# Patient Record
Sex: Female | Born: 1955 | Race: Black or African American | Hispanic: No | Smoking: Never smoker
Health system: Southern US, Community
[De-identification: ages and names within clinical notes are randomized; demographics above are authoritative.]

## PROBLEM LIST (undated history)

## (undated) ENCOUNTER — Emergency Department (HOSPITAL_COMMUNITY): Admission: EM | Payer: Self-pay | Source: Home / Self Care

## (undated) DIAGNOSIS — E118 Type 2 diabetes mellitus with unspecified complications: Secondary | ICD-10-CM

## (undated) DIAGNOSIS — I214 Non-ST elevation (NSTEMI) myocardial infarction: Secondary | ICD-10-CM

## (undated) DIAGNOSIS — I10 Essential (primary) hypertension: Secondary | ICD-10-CM

## (undated) DIAGNOSIS — I4892 Unspecified atrial flutter: Secondary | ICD-10-CM

## (undated) DIAGNOSIS — Z794 Long term (current) use of insulin: Secondary | ICD-10-CM

## (undated) DIAGNOSIS — N289 Disorder of kidney and ureter, unspecified: Secondary | ICD-10-CM

## (undated) DIAGNOSIS — I4891 Unspecified atrial fibrillation: Secondary | ICD-10-CM

## (undated) DIAGNOSIS — I219 Acute myocardial infarction, unspecified: Secondary | ICD-10-CM

## (undated) DIAGNOSIS — I252 Old myocardial infarction: Secondary | ICD-10-CM

## (undated) DIAGNOSIS — E785 Hyperlipidemia, unspecified: Secondary | ICD-10-CM

## (undated) DIAGNOSIS — I2101 ST elevation (STEMI) myocardial infarction involving left main coronary artery: Secondary | ICD-10-CM

## (undated) DIAGNOSIS — N186 End stage renal disease: Secondary | ICD-10-CM

## (undated) HISTORY — PX: OTHER SURGICAL HISTORY: SHX169

## (undated) HISTORY — DX: Hyperlipidemia, unspecified: E78.5

## (undated) HISTORY — DX: Essential (primary) hypertension: I10

---

## 2006-02-04 ENCOUNTER — Emergency Department: Payer: Self-pay | Admitting: Unknown Physician Specialty

## 2006-02-09 ENCOUNTER — Emergency Department: Payer: Self-pay | Admitting: Internal Medicine

## 2010-01-22 ENCOUNTER — Observation Stay (HOSPITAL_COMMUNITY): Admission: EM | Admit: 2010-01-22 | Discharge: 2010-01-26 | Payer: Self-pay | Admitting: Emergency Medicine

## 2010-01-24 ENCOUNTER — Ambulatory Visit: Payer: Self-pay | Admitting: Surgery

## 2010-01-24 ENCOUNTER — Encounter (INDEPENDENT_AMBULATORY_CARE_PROVIDER_SITE_OTHER): Payer: Self-pay | Admitting: Internal Medicine

## 2010-03-26 ENCOUNTER — Emergency Department (HOSPITAL_COMMUNITY): Admission: EM | Admit: 2010-03-26 | Discharge: 2010-03-26 | Payer: Self-pay | Admitting: Emergency Medicine

## 2010-12-10 ENCOUNTER — Emergency Department: Payer: Self-pay | Admitting: Emergency Medicine

## 2011-02-21 LAB — GLUCOSE, CAPILLARY: Glucose-Capillary: 68 mg/dL — ABNORMAL LOW (ref 70–99)

## 2011-02-23 LAB — BASIC METABOLIC PANEL
BUN: 14 mg/dL (ref 6–23)
BUN: 8 mg/dL (ref 6–23)
CO2: 27 mEq/L (ref 19–32)
Chloride: 100 mEq/L (ref 96–112)
Chloride: 101 mEq/L (ref 96–112)
Creatinine, Ser: 0.6 mg/dL (ref 0.4–1.2)
Glucose, Bld: 250 mg/dL — ABNORMAL HIGH (ref 70–99)
Potassium: 3.7 mEq/L (ref 3.5–5.1)

## 2011-02-23 LAB — LIPID PANEL
HDL: 44 mg/dL (ref >39)
LDL Cholesterol: 145 mg/dL — ABNORMAL HIGH (ref 0–99)
Triglycerides: 103 mg/dL (ref <150)

## 2011-02-23 LAB — GLUCOSE, CAPILLARY
Glucose-Capillary: 214 mg/dL — ABNORMAL HIGH (ref 70–99)
Glucose-Capillary: 224 mg/dL — ABNORMAL HIGH (ref 70–99)
Glucose-Capillary: 224 mg/dL — ABNORMAL HIGH (ref 70–99)
Glucose-Capillary: 226 mg/dL — ABNORMAL HIGH (ref 70–99)
Glucose-Capillary: 233 mg/dL — ABNORMAL HIGH (ref 70–99)
Glucose-Capillary: 233 mg/dL — ABNORMAL HIGH (ref 70–99)
Glucose-Capillary: 234 mg/dL — ABNORMAL HIGH (ref 70–99)
Glucose-Capillary: 273 mg/dL — ABNORMAL HIGH (ref 70–99)
Glucose-Capillary: 280 mg/dL — ABNORMAL HIGH (ref 70–99)
Glucose-Capillary: 338 mg/dL — ABNORMAL HIGH (ref 70–99)
Glucose-Capillary: 379 mg/dL — ABNORMAL HIGH (ref 70–99)

## 2011-02-23 LAB — CBC
HCT: 38 % (ref 36.0–46.0)
Hemoglobin: 13.1 g/dL (ref 12.0–15.0)
MCHC: 34.3 g/dL (ref 30.0–36.0)
MCV: 86.8 fL (ref 78.0–100.0)
MCV: 87.2 fL (ref 78.0–100.0)
MCV: 87.2 fL (ref 78.0–100.0)
Platelets: 269 10*3/uL (ref 150–400)
Platelets: 272 10*3/uL (ref 150–400)
Platelets: 319 10*3/uL (ref 150–400)
RBC: 4.4 MIL/uL (ref 3.87–5.11)
RBC: 4.43 MIL/uL (ref 3.87–5.11)
RBC: 4.83 MIL/uL (ref 3.87–5.11)
RDW: 13.2 % (ref 11.5–15.5)
RDW: 13.2 % (ref 11.5–15.5)
WBC: 7.4 10*3/uL (ref 4.0–10.5)

## 2011-02-23 LAB — DIFFERENTIAL
Basophils Relative: 1 % (ref 0–1)
Lymphocytes Relative: 42 % (ref 12–46)
Lymphs Abs: 2.6 10*3/uL (ref 0.7–4.0)
Monocytes Absolute: 0.4 10*3/uL (ref 0.1–1.0)
Monocytes Relative: 6 % (ref 3–12)
Monocytes Relative: 8 % (ref 3–12)
Neutrophils Relative %: 48 % (ref 43–77)
Neutrophils Relative %: 53 % (ref 43–77)

## 2011-02-23 LAB — COMPREHENSIVE METABOLIC PANEL
ALT: 12 U/L (ref 0–35)
Albumin: 2.9 g/dL — ABNORMAL LOW (ref 3.5–5.2)
Alkaline Phosphatase: 70 U/L (ref 39–117)
BUN: 8 mg/dL (ref 6–23)
CO2: 30 mEq/L (ref 19–32)
Chloride: 94 mEq/L — ABNORMAL LOW (ref 96–112)
Chloride: 99 mEq/L (ref 96–112)
Creatinine, Ser: 0.65 mg/dL (ref 0.4–1.2)
GFR calc Af Amer: >60 mL/min (ref >60)
GFR calc non Af Amer: >60 mL/min (ref >60)
GFR calc non Af Amer: >60 mL/min (ref >60)
Total Protein: 6.5 g/dL (ref 6.0–8.3)

## 2011-02-23 LAB — POCT CARDIAC MARKERS
Myoglobin, poc: 382 ng/mL (ref 12–200)
Troponin i, poc: <0.05 ng/mL (ref 0.00–0.09)

## 2011-02-23 LAB — HEMOGLOBIN A1C: Hgb A1c MFr Bld: 14.3 % — ABNORMAL HIGH (ref 4.6–6.1)

## 2011-02-23 LAB — TSH: TSH: 1.485 u[IU]/mL (ref 0.350–4.500)

## 2011-02-23 LAB — PROTIME-INR: Prothrombin Time: 14.2 seconds (ref 11.6–15.2)

## 2011-02-23 LAB — C-REACTIVE PROTEIN: CRP: 0 mg/dL — ABNORMAL LOW (ref <0.6)

## 2011-02-23 LAB — APTT: aPTT: 33 seconds (ref 24–37)

## 2012-03-08 ENCOUNTER — Encounter: Payer: Self-pay | Admitting: Internal Medicine

## 2012-03-08 ENCOUNTER — Ambulatory Visit (INDEPENDENT_AMBULATORY_CARE_PROVIDER_SITE_OTHER): Payer: Self-pay | Admitting: Internal Medicine

## 2012-03-08 VITALS — BP 222/118 | HR 76 | Temp 97.7°F | Ht 61.0 in | Wt 126.0 lb

## 2012-03-08 DIAGNOSIS — I10 Essential (primary) hypertension: Secondary | ICD-10-CM | POA: Insufficient documentation

## 2012-03-08 DIAGNOSIS — E119 Type 2 diabetes mellitus without complications: Secondary | ICD-10-CM

## 2012-03-08 DIAGNOSIS — E785 Hyperlipidemia, unspecified: Secondary | ICD-10-CM | POA: Insufficient documentation

## 2012-03-08 HISTORY — DX: Type 2 diabetes mellitus without complications: E11.9

## 2012-03-08 MED ORDER — VALSARTAN-HYDROCHLOROTHIAZIDE 160-12.5 MG PO TABS: 1.0000 | ORAL_TABLET | Freq: Every day | ORAL | Status: DC

## 2012-03-08 NOTE — Patient Instructions (Signed)
Start Diovan-HCT today.  Labs next Friday.  Follow up in 2 weeks.  Check BP daily at home and email with results.

## 2012-03-08 NOTE — Assessment & Plan Note (Signed)
Will check lipids and LFTs with labs. Continue simvastatin. Follow up 2 weeks.

## 2012-03-08 NOTE — Assessment & Plan Note (Signed)
Pt has not been checking BG. Will check A1c with labs and request lab reports from previous office. Follow up 2 weeks. Continue Glipizide-metformin.

## 2012-03-08 NOTE — Progress Notes (Signed)
Subjective:    Patient ID: Deborah [name], female    DOB: [birth year], 56 y.o.   MRN: [id number]  HPI 56 year old female with history of hypertension, diabetes presents to establish care. Her daughter reports that she has been concerned because her mother's blood pressure has been running very high, typically two hundred over one 100s. With elevated blood pressure she has had some headache and dizziness. She has been seen by another physician and they were using lisinopril with no improvement. Her daughter reports that in the past she took Diovan hydrochlorothiazide with significant reduction in her blood pressure. Her daughter reports that she has been compliant with her medications however did not take medications this morning. She denies chest pain or shortness of breath.  In regards to her diabetes, she has not been checking her blood sugars. She reports that she ran out of test strips. She does report full compliance with her medications. She is unsure of her last hemoglobin A1c.  Outpatient Encounter Prescriptions as of 03/08/2012  Medication Sig Dispense Refill  . glipiZIDE (GLUCOTROL) 10 MG tablet Take 10 mg by mouth 2 (two) times daily before a meal.      . metFORMIN (GLUCOPHAGE) 500 MG tablet Take 500 mg by mouth 2 (two) times daily.      . metoprolol (LOPRESSOR) 50 MG tablet Take 50 mg by mouth 2 (two) times daily.      . ranitidine (ZANTAC) 150 MG tablet Take 150 mg by mouth 2 (two) times daily.      . simvastatin (ZOCOR) 40 MG tablet Take 40 mg by mouth every evening.      Marland Kitchen DISCONTD: lisinopril (PRINIVIL,ZESTRIL) 20 MG tablet Take 20 mg by mouth daily.      . valsartan-hydrochlorothiazide (DIOVAN HCT) 160-12.5 MG per tablet Take 1 tablet by mouth daily.  30 tablet  3    Review of Systems  Constitutional: Negative for fever, chills, appetite change, fatigue and unexpected weight change.  HENT: Negative for ear pain, congestion, sore throat, trouble swallowing, neck pain, voice change  and sinus pressure.   Eyes: Negative for visual disturbance.  Respiratory: Negative for cough, shortness of breath, wheezing and stridor.   Cardiovascular: Negative for chest pain, palpitations and leg swelling.  Gastrointestinal: Negative for nausea, vomiting, abdominal pain, diarrhea, constipation, blood in stool, abdominal distention and anal bleeding.  Genitourinary: Negative for dysuria and flank pain.  Musculoskeletal: Negative for myalgias, arthralgias and gait problem.  Skin: Negative for color change and rash.  Neurological: Positive for dizziness, light-headedness and headaches.  Hematological: Negative for adenopathy. Does not bruise/bleed easily.  Psychiatric/Behavioral: Negative for suicidal ideas, sleep disturbance and dysphoric mood. The patient is not nervous/anxious.    BP 222/118  Pulse 76  Temp(Src) 97.7 F (36.5 C) (Oral)  Ht 5\' 1"  (1.549 m)  Wt 126 lb (57.153 kg)  BMI 23.81 kg/m2  SpO2 98%     Objective:   Physical Exam  Constitutional: She is oriented to person, place, and time. She appears well-developed and well-nourished. No distress.  HENT:  Head: Normocephalic and atraumatic.  Right Ear: External ear normal.  Left Ear: External ear normal.  Nose: Nose normal.  Mouth/Throat: Oropharynx is clear and moist. No oropharyngeal exudate.  Eyes: Conjunctivae are normal. Pupils are equal, round, and reactive to light. Right eye exhibits no discharge. Left eye exhibits no discharge. No scleral icterus.  Neck: Normal range of motion. Neck supple. No tracheal deviation present. No thyromegaly present.  Cardiovascular: Normal rate, regular  rhythm and intact distal pulses.  Exam reveals no gallop and no friction rub.   Murmur (high pitched systolic) heard. Pulmonary/Chest: Effort normal and breath sounds normal. No respiratory distress. She has no wheezes. She has no rales. She exhibits no tenderness.  Musculoskeletal: Normal range of motion. She exhibits no edema and  no tenderness.  Lymphadenopathy:    She has no cervical adenopathy.  Neurological: She is alert and oriented to person, place, and time. No cranial nerve deficit. She exhibits normal muscle tone. Coordination normal.  Skin: Skin is warm and dry. No rash noted. She is not diaphoretic. No erythema. No pallor.  Psychiatric: She has a normal mood and affect. Her behavior is normal. Judgment and thought content normal.          Assessment & Plan:

## 2012-03-08 NOTE — Assessment & Plan Note (Addendum)
BP markedly elevated today, however this has been chronic. Pt reports compliance with medications, however did not take medications today. Will change lisinopril to Diovan-HCT, as pt reports much better BP control with this in the past.  Will have daughter check pt BP daily and email with readings. Follow up Monday. Will get report on recent labs from 1-2 weeks ago at her PCPs. Check renal function with labs in 1 week. If no improvement in BP by Monday, will add Amlodipine.

## 2012-03-11 ENCOUNTER — Encounter: Payer: Self-pay | Admitting: Internal Medicine

## 2012-03-11 ENCOUNTER — Ambulatory Visit (INDEPENDENT_AMBULATORY_CARE_PROVIDER_SITE_OTHER): Payer: Self-pay | Admitting: Internal Medicine

## 2012-03-11 VITALS — BP 198/106 | Temp 98.7°F | Ht 61.5 in | Wt 123.8 lb

## 2012-03-11 DIAGNOSIS — E785 Hyperlipidemia, unspecified: Secondary | ICD-10-CM

## 2012-03-11 DIAGNOSIS — E119 Type 2 diabetes mellitus without complications: Secondary | ICD-10-CM

## 2012-03-11 DIAGNOSIS — I10 Essential (primary) hypertension: Secondary | ICD-10-CM

## 2012-03-11 LAB — COMPREHENSIVE METABOLIC PANEL
AST: 17 U/L (ref 0–37)
Albumin: 3.9 g/dL (ref 3.5–5.2)
BUN: 16 mg/dL (ref 6–23)
Calcium: 9.3 mg/dL (ref 8.4–10.5)
Chloride: 99 mEq/L (ref 96–112)
Glucose, Bld: 109 mg/dL — ABNORMAL HIGH (ref 70–99)
Potassium: 3.4 mEq/L — ABNORMAL LOW (ref 3.5–5.1)

## 2012-03-11 LAB — MICROALBUMIN / CREATININE URINE RATIO
Creatinine,U: 247.5 mg/dL
Microalb, Ur: 419.1 mg/dL — ABNORMAL HIGH (ref 0.0–1.9)

## 2012-03-11 MED ORDER — AMLODIPINE BESYLATE 5 MG PO TABS: 5.0000 mg | ORAL_TABLET | Freq: Every day | ORAL | Status: DC

## 2012-03-11 MED ORDER — METOPROLOL TARTRATE 100 MG PO TABS: 100.0000 mg | ORAL_TABLET | Freq: Two times a day (BID) | ORAL | Status: DC

## 2012-03-11 NOTE — Patient Instructions (Signed)
Increase Metoprolol to 100mg  twice daily.  Continue to check blood pressure daily.  Follow up 1 week.

## 2012-03-11 NOTE — Assessment & Plan Note (Signed)
Will check A1c with labs today. Will continue glipizide metformin.

## 2012-03-11 NOTE — Assessment & Plan Note (Signed)
Blood pressure continues to be elevated despite adding Diovan hydrochlorothiazide. Will increase metoprolol to 100 mg twice daily. Will add amlodipine 5 mg daily. Patient's daughter will continue to check blood pressure daily and will e-mail or call with results. Patient will followup in one week or sooner as needed. We'll check renal function with labs today.

## 2012-03-11 NOTE — Assessment & Plan Note (Signed)
Will check lipids and LFTs with labs today. Note that we are adding amlodipine 5 mg daily. If we need to increase amlodipine to 10 mg, will need to change from simvastatin to another cholesterol medication.

## 2012-03-11 NOTE — Progress Notes (Signed)
Subjective:    Patient ID: Deborah [name], female    DOB: 07/07/1956, 56 y.o.   MRN: [id number]  HPI 56 year old female with history of hypertension presents for followup. Her daughter reports that she started her on the Diovan hydrochlorothiazide over the weekend. Her blood pressure continues to be elevated near 200/100. Patient denies any headache, chest pain, palpitations. She reports compliance with her medications including the new medication and her metoprolol.   Outpatient Encounter Prescriptions as of 03/11/2012  Medication Sig Dispense Refill  . glipiZIDE (GLUCOTROL) 10 MG tablet Take 10 mg by mouth 2 (two) times daily before a meal.      . metFORMIN (GLUCOPHAGE) 500 MG tablet Take 500 mg by mouth 2 (two) times daily.      . metoprolol (LOPRESSOR) 100 MG tablet Take 1 tablet (100 mg total) by mouth 2 (two) times daily.  60 tablet  3  . ranitidine (ZANTAC) 150 MG tablet Take 150 mg by mouth 2 (two) times daily.      . simvastatin (ZOCOR) 40 MG tablet Take 40 mg by mouth every evening.      . valsartan-hydrochlorothiazide (DIOVAN HCT) 160-12.5 MG per tablet Take 1 tablet by mouth daily.  30 tablet  3  . DISCONTD: metoprolol (LOPRESSOR) 50 MG tablet Take 50 mg by mouth 2 (two) times daily.      Marland Kitchen amLODipine (NORVASC) 5 MG tablet Take 1 tablet (5 mg total) by mouth daily.  30 tablet  11    Review of Systems  Constitutional: Positive for fatigue. Negative for fever, chills, appetite change and unexpected weight change.  HENT: Negative for ear pain, congestion, sore throat, trouble swallowing, neck pain, voice change and sinus pressure.   Eyes: Negative for visual disturbance.  Respiratory: Negative for cough, shortness of breath, wheezing and stridor.   Cardiovascular: Negative for chest pain, palpitations and leg swelling.  Gastrointestinal: Negative for nausea, vomiting, abdominal pain, diarrhea, constipation, blood in stool, abdominal distention and anal bleeding.  Genitourinary:  Negative for dysuria and flank pain.  Musculoskeletal: Negative for myalgias, arthralgias and gait problem.  Skin: Negative for color change and rash.  Neurological: Negative for dizziness and headaches.  Hematological: Negative for adenopathy. Does not bruise/bleed easily.  Psychiatric/Behavioral: Negative for suicidal ideas, sleep disturbance and dysphoric mood. The patient is not nervous/anxious.    BP 198/106  Temp(Src) 98.7 F (37.1 C) (Oral)  Ht 5' 1.5" (1.562 m)  Wt 123 lb 12 oz (56.133 kg)  BMI 23.00 kg/m2     Objective:   Physical Exam  Constitutional: She is oriented to person, place, and time. She appears well-developed and well-nourished. No distress.  HENT:  Head: Normocephalic and atraumatic.  Right Ear: External ear normal.  Left Ear: External ear normal.  Nose: Nose normal.  Mouth/Throat: Oropharynx is clear and moist. No oropharyngeal exudate.  Eyes: Conjunctivae are normal. Pupils are equal, round, and reactive to light. Right eye exhibits no discharge. Left eye exhibits no discharge. No scleral icterus.  Neck: Normal range of motion. Neck supple. No tracheal deviation present. No thyromegaly present.  Cardiovascular: Normal rate, regular rhythm, normal heart sounds and intact distal pulses.  Exam reveals no gallop and no friction rub.   No murmur heard. Pulmonary/Chest: Effort normal and breath sounds normal. No respiratory distress. She has no wheezes. She has no rales. She exhibits no tenderness.  Musculoskeletal: Normal range of motion. She exhibits no edema and no tenderness.  Lymphadenopathy:    She has no cervical  adenopathy.  Neurological: She is alert and oriented to person, place, and time. No cranial nerve deficit. She exhibits normal muscle tone. Coordination normal.  Skin: Skin is warm and dry. No rash noted. She is not diaphoretic. No erythema. No pallor.  Psychiatric: She has a normal mood and affect. Her behavior is normal. Judgment and thought  content normal.          Assessment & Plan:

## 2012-03-15 ENCOUNTER — Other Ambulatory Visit: Payer: Self-pay

## 2012-03-22 ENCOUNTER — Ambulatory Visit (INDEPENDENT_AMBULATORY_CARE_PROVIDER_SITE_OTHER): Payer: Self-pay | Admitting: Internal Medicine

## 2012-03-22 ENCOUNTER — Encounter: Payer: Self-pay | Admitting: Internal Medicine

## 2012-03-22 VITALS — BP 150/110 | HR 72 | Temp 99.2°F | Wt 121.0 lb

## 2012-03-22 DIAGNOSIS — I10 Essential (primary) hypertension: Secondary | ICD-10-CM

## 2012-03-22 MED ORDER — AMLODIPINE BESYLATE 10 MG PO TABS: 10.0000 mg | ORAL_TABLET | Freq: Every day | ORAL | Status: DC

## 2012-03-22 NOTE — Progress Notes (Signed)
Subjective:    Patient ID: Deborah [name], female    DOB: October 23, 1956, 56 y.o.   MRN: [id number]  HPI 56 year old female with history of hypertension and diabetes presents for followup. Her daughter brings record of her blood pressures which are typically between 150 and 160/90-110. This is an improvement from her previous blood pressure. Patient reports improvement in her energy level, and resolution of her headaches. She reports that she has been feeling well. She has been compliant with her medications and denies any noted side effects.  Outpatient Encounter Prescriptions as of 03/22/2012  Medication Sig Dispense Refill  . amLODipine (NORVASC) 10 MG tablet Take 1 tablet (10 mg total) by mouth daily.  30 tablet  11  . glipiZIDE (GLUCOTROL) 10 MG tablet Take 10 mg by mouth 2 (two) times daily before a meal.      . metFORMIN (GLUCOPHAGE) 500 MG tablet Take 500 mg by mouth 2 (two) times daily.      . metoprolol (LOPRESSOR) 100 MG tablet Take 1 tablet (100 mg total) by mouth 2 (two) times daily.  60 tablet  3  . ranitidine (ZANTAC) 150 MG tablet Take 150 mg by mouth 2 (two) times daily.      . simvastatin (ZOCOR) 40 MG tablet Take 40 mg by mouth every evening.      . valsartan-hydrochlorothiazide (DIOVAN HCT) 160-12.5 MG per tablet Take 1 tablet by mouth daily.  30 tablet  3  . DISCONTD: amLODipine (NORVASC) 5 MG tablet Take 1 tablet (5 mg total) by mouth daily.  30 tablet  11    Review of Systems  Constitutional: Negative for fever, chills, appetite change, fatigue and unexpected weight change.  HENT: Negative for ear pain, congestion, sore throat, trouble swallowing, neck pain, voice change and sinus pressure.   Eyes: Negative for visual disturbance.  Respiratory: Negative for cough, shortness of breath, wheezing and stridor.   Cardiovascular: Negative for chest pain, palpitations and leg swelling.  Gastrointestinal: Negative for nausea, vomiting, abdominal pain, diarrhea, constipation, blood  in stool, abdominal distention and anal bleeding.  Genitourinary: Negative for dysuria and flank pain.  Musculoskeletal: Negative for myalgias, arthralgias and gait problem.  Skin: Negative for color change and rash.  Neurological: Negative for dizziness and headaches.  Hematological: Negative for adenopathy. Does not bruise/bleed easily.  Psychiatric/Behavioral: Negative for suicidal ideas, sleep disturbance and dysphoric mood. The patient is not nervous/anxious.    BP 150/110  Pulse 72  Temp(Src) 99.2 F (37.3 C) (Oral)  Wt 121 lb (54.885 kg)  SpO2 99%     Objective:   Physical Exam  Constitutional: She is oriented to person, place, and time. She appears well-developed and well-nourished. No distress.  HENT:  Head: Normocephalic and atraumatic.  Right Ear: External ear normal.  Left Ear: External ear normal.  Nose: Nose normal.  Mouth/Throat: Oropharynx is clear and moist. No oropharyngeal exudate.  Eyes: Conjunctivae are normal. Pupils are equal, round, and reactive to light. Right eye exhibits no discharge. Left eye exhibits no discharge. No scleral icterus.  Neck: Normal range of motion. Neck supple. No tracheal deviation present. No thyromegaly present.  Cardiovascular: Normal rate, regular rhythm, normal heart sounds and intact distal pulses.  Exam reveals no gallop and no friction rub.   No murmur heard. Pulmonary/Chest: Effort normal and breath sounds normal. No respiratory distress. She has no wheezes. She has no rales. She exhibits no tenderness.  Musculoskeletal: Normal range of motion. She exhibits no edema and no tenderness.  Lymphadenopathy:    She has no cervical adenopathy.  Neurological: She is alert and oriented to person, place, and time. No cranial nerve deficit. She exhibits normal muscle tone. Coordination normal.  Skin: Skin is warm and dry. No rash noted. She is not diaphoretic. No erythema. No pallor.  Psychiatric: She has a normal mood and affect. Her  behavior is normal. Judgment and thought content normal.          Assessment & Plan:

## 2012-03-22 NOTE — Assessment & Plan Note (Signed)
Blood pressure better controlled on current medications. Will increase amlodipine to 10 mg daily with goal of getting blood pressure to 140/90. Patient will followup in one month. Her daughter will continue to record and will e-mail or call with blood pressures in the interim prior to her next appointment.

## 2012-04-19 ENCOUNTER — Encounter: Payer: Self-pay | Admitting: Internal Medicine

## 2012-04-19 ENCOUNTER — Ambulatory Visit (INDEPENDENT_AMBULATORY_CARE_PROVIDER_SITE_OTHER): Payer: Self-pay | Admitting: Internal Medicine

## 2012-04-19 VITALS — BP 152/94 | HR 63 | Temp 97.9°F | Resp 14 | Wt 119.8 lb

## 2012-04-19 DIAGNOSIS — I10 Essential (primary) hypertension: Secondary | ICD-10-CM

## 2012-04-19 DIAGNOSIS — K219 Gastro-esophageal reflux disease without esophagitis: Secondary | ICD-10-CM

## 2012-04-19 DIAGNOSIS — E119 Type 2 diabetes mellitus without complications: Secondary | ICD-10-CM

## 2012-04-19 DIAGNOSIS — E785 Hyperlipidemia, unspecified: Secondary | ICD-10-CM

## 2012-04-19 MED ORDER — PRAVASTATIN SODIUM 20 MG PO TABS: 20.0000 mg | ORAL_TABLET | Freq: Every day | ORAL | Status: DC

## 2012-04-19 MED ORDER — VALSARTAN-HYDROCHLOROTHIAZIDE 320-25 MG PO TABS: 1.0000 | ORAL_TABLET | Freq: Every day | ORAL | Status: DC

## 2012-04-19 MED ORDER — METFORMIN HCL 500 MG PO TABS: 500.0000 mg | ORAL_TABLET | Freq: Two times a day (BID) | ORAL | Status: DC

## 2012-04-19 MED ORDER — RANITIDINE HCL 150 MG PO TABS: 150.0000 mg | ORAL_TABLET | Freq: Two times a day (BID) | ORAL | Status: DC

## 2012-04-19 MED ORDER — SIMVASTATIN 40 MG PO TABS: 40.0000 mg | ORAL_TABLET | Freq: Every evening | ORAL | Status: DC

## 2012-04-19 MED ORDER — GLIPIZIDE 10 MG PO TABS: 10.0000 mg | ORAL_TABLET | Freq: Two times a day (BID) | ORAL | Status: DC

## 2012-04-19 NOTE — Assessment & Plan Note (Signed)
Blood pressure still above goal today. Will increase valsartan to 320 mg and hydrochlorothiazide 25 mg daily. Will repeat renal function and electrolytes in one week. Patient will followup in one month.

## 2012-04-19 NOTE — Progress Notes (Signed)
Subjective:    Patient ID: Deborah [name], female    DOB: 08/29/1956, 56 y.o.   MRN: [id number]  HPI 56 year old female with history of hypertension and diabetes presents for followup. Her daughter reports that she is doing well. Her blood pressure has been better controlled, typically 130-150s/90s. She has not had headache, chest pain, or palpitations. She reports full compliance with her medications.  In regards to her diabetes, she did not bring a record of blood sugars today, but reports good control of her sugars. She also reports full compliance with her diabetes medications.  Outpatient Encounter Prescriptions as of 04/19/2012  Medication Sig Dispense Refill  . amLODipine (NORVASC) 10 MG tablet Take 1 tablet (10 mg total) by mouth daily.  30 tablet  11  . glipiZIDE (GLUCOTROL) 10 MG tablet Take 1 tablet (10 mg total) by mouth 2 (two) times daily before a meal.  60 tablet  6  . metFORMIN (GLUCOPHAGE) 500 MG tablet Take 1 tablet (500 mg total) by mouth 2 (two) times daily.  60 tablet  6  . metoprolol (LOPRESSOR) 100 MG tablet Take 1 tablet (100 mg total) by mouth 2 (two) times daily.  60 tablet  3  . ranitidine (ZANTAC) 150 MG tablet Take 1 tablet (150 mg total) by mouth 2 (two) times daily.  60 tablet  6  . pravastatin (PRAVACHOL) 20 MG tablet Take 1 tablet (20 mg total) by mouth daily.  30 tablet  6  . valsartan-hydrochlorothiazide (DIOVAN-HCT) 320-25 MG per tablet Take 1 tablet by mouth daily.  30 tablet  3   BP 152/94  Pulse 63  Temp(Src) 97.9 F (36.6 C) (Oral)  Resp 14  Wt 119 lb 12 oz (54.318 kg)  SpO2 100%  Review of Systems  Constitutional: Negative for fever, chills, appetite change, fatigue and unexpected weight change.  HENT: Negative for ear pain, congestion, sore throat, trouble swallowing, neck pain, voice change and sinus pressure.   Eyes: Negative for visual disturbance.  Respiratory: Negative for cough, shortness of breath, wheezing and stridor.   Cardiovascular:  Negative for chest pain, palpitations and leg swelling.  Gastrointestinal: Negative for nausea, vomiting, abdominal pain, diarrhea, constipation, blood in stool, abdominal distention and anal bleeding.  Genitourinary: Negative for dysuria and flank pain.  Musculoskeletal: Negative for myalgias, arthralgias and gait problem.  Skin: Negative for color change and rash.  Neurological: Negative for dizziness and headaches.  Hematological: Negative for adenopathy. Does not bruise/bleed easily.  Psychiatric/Behavioral: Negative for suicidal ideas, sleep disturbance and dysphoric mood. The patient is not nervous/anxious.        Objective:   Physical Exam  Constitutional: She is oriented to person, place, and time. She appears well-developed and well-nourished. No distress.  HENT:  Head: Normocephalic and atraumatic.  Right Ear: External ear normal.  Left Ear: External ear normal.  Nose: Nose normal.  Mouth/Throat: Oropharynx is clear and moist. No oropharyngeal exudate.  Eyes: Conjunctivae are normal. Pupils are equal, round, and reactive to light. Right eye exhibits no discharge. Left eye exhibits no discharge. No scleral icterus.  Neck: Normal range of motion. Neck supple. No tracheal deviation present. No thyromegaly present.  Cardiovascular: Normal rate, regular rhythm, normal heart sounds and intact distal pulses.  Exam reveals no gallop and no friction rub.   No murmur heard. Pulmonary/Chest: Effort normal and breath sounds normal. No respiratory distress. She has no wheezes. She has no rales. She exhibits no tenderness.  Musculoskeletal: Normal range of motion. She exhibits no  edema and no tenderness.  Lymphadenopathy:    She has no cervical adenopathy.  Neurological: She is alert and oriented to person, place, and time. No cranial nerve deficit. She exhibits normal muscle tone. Coordination normal.  Skin: Skin is warm and dry. No rash noted. She is not diaphoretic. No erythema. No  pallor.  Psychiatric: She has a normal mood and affect. Her behavior is normal. Judgment and thought content normal.          Assessment & Plan:

## 2012-04-19 NOTE — Assessment & Plan Note (Signed)
Patient has not been taking simvastatin. Will stop simvastatin and start pravastatin given the potential interaction between amlodipine and simvastatin. Goal LDL less than 70. Will repeat lipids in one month.

## 2012-04-19 NOTE — Assessment & Plan Note (Addendum)
Patient reports good control of blood sugars. Will continue current medications. Followup in one month. Will check A1c prior to next visit. Has been holding off on labs until patient establishes [facility] care coverage.

## 2012-04-25 ENCOUNTER — Other Ambulatory Visit (INDEPENDENT_AMBULATORY_CARE_PROVIDER_SITE_OTHER): Payer: Self-pay

## 2012-04-25 DIAGNOSIS — E785 Hyperlipidemia, unspecified: Secondary | ICD-10-CM

## 2012-04-25 DIAGNOSIS — I10 Essential (primary) hypertension: Secondary | ICD-10-CM

## 2012-04-25 LAB — COMPREHENSIVE METABOLIC PANEL
ALT: 9 U/L (ref 0–35)
AST: 14 U/L (ref 0–37)
Albumin: 4 g/dL (ref 3.5–5.2)
BUN: 22 mg/dL (ref 6–23)
CO2: 25 mEq/L (ref 19–32)
Calcium: 9.6 mg/dL (ref 8.4–10.5)
Chloride: 101 mEq/L (ref 96–112)
Creatinine, Ser: 1.4 mg/dL — ABNORMAL HIGH (ref 0.4–1.2)
GFR: 51.94 mL/min — ABNORMAL LOW (ref >60.00)
Potassium: 3.9 mEq/L (ref 3.5–5.1)

## 2012-04-25 LAB — LIPID PANEL
LDL Cholesterol: 111 mg/dL — ABNORMAL HIGH (ref 0–99)
Total CHOL/HDL Ratio: 3
Triglycerides: 68 mg/dL (ref 0.0–149.0)

## 2012-05-24 ENCOUNTER — Ambulatory Visit: Payer: Self-pay | Admitting: Internal Medicine

## 2012-05-24 DIAGNOSIS — Z0289 Encounter for other administrative examinations: Secondary | ICD-10-CM

## 2012-06-14 ENCOUNTER — Ambulatory Visit (INDEPENDENT_AMBULATORY_CARE_PROVIDER_SITE_OTHER): Payer: Self-pay | Admitting: Internal Medicine

## 2012-06-14 ENCOUNTER — Encounter: Payer: Self-pay | Admitting: Internal Medicine

## 2012-06-14 ENCOUNTER — Telehealth: Payer: Self-pay | Admitting: Internal Medicine

## 2012-06-14 VITALS — BP 130/90 | HR 65 | Temp 98.4°F | Ht 61.5 in | Wt 122.0 lb

## 2012-06-14 DIAGNOSIS — E119 Type 2 diabetes mellitus without complications: Secondary | ICD-10-CM

## 2012-06-14 DIAGNOSIS — N183 Chronic kidney disease, stage 3 unspecified: Secondary | ICD-10-CM | POA: Insufficient documentation

## 2012-06-14 DIAGNOSIS — K219 Gastro-esophageal reflux disease without esophagitis: Secondary | ICD-10-CM

## 2012-06-14 DIAGNOSIS — E785 Hyperlipidemia, unspecified: Secondary | ICD-10-CM

## 2012-06-14 DIAGNOSIS — N289 Disorder of kidney and ureter, unspecified: Secondary | ICD-10-CM

## 2012-06-14 DIAGNOSIS — I10 Essential (primary) hypertension: Secondary | ICD-10-CM

## 2012-06-14 MED ORDER — VALSARTAN-HYDROCHLOROTHIAZIDE 320-25 MG PO TABS: 1.0000 | ORAL_TABLET | Freq: Every day | ORAL | Status: DC

## 2012-06-14 MED ORDER — METFORMIN HCL 500 MG PO TABS: 500.0000 mg | ORAL_TABLET | Freq: Two times a day (BID) | ORAL | Status: DC

## 2012-06-14 MED ORDER — PRAVASTATIN SODIUM 20 MG PO TABS: 20.0000 mg | ORAL_TABLET | Freq: Every day | ORAL | Status: DC

## 2012-06-14 MED ORDER — GLIPIZIDE 10 MG PO TABS: 10.0000 mg | ORAL_TABLET | Freq: Two times a day (BID) | ORAL | Status: DC

## 2012-06-14 MED ORDER — RANITIDINE HCL 150 MG PO TABS: 150.0000 mg | ORAL_TABLET | Freq: Two times a day (BID) | ORAL | Status: DC

## 2012-06-14 MED ORDER — METOPROLOL TARTRATE 100 MG PO TABS: 100.0000 mg | ORAL_TABLET | Freq: Two times a day (BID) | ORAL | Status: DC

## 2012-06-14 MED ORDER — AMLODIPINE BESYLATE 10 MG PO TABS: 10.0000 mg | ORAL_TABLET | Freq: Every day | ORAL | Status: DC

## 2012-06-14 NOTE — Assessment & Plan Note (Signed)
Patient noted to have increased creatinine at 1.4 on recent labs. However, microalbuminuria improved with improved control of blood pressure. We'll plan to recheck renal function this month.

## 2012-06-14 NOTE — Progress Notes (Signed)
Subjective:    Patient ID: Deborah [name], female    DOB: [birth year], 56 y.o.   MRN: [id number]  HPI 56 year old female with history of hypertension, diabetes, hyperlipidemia presents for followup. In regards to her hypertension, she reports blood pressure is been well controlled typically 130s over 60s-80s. She does occasionally have an elevated systolic blood pressure in the 160s, but this is typically when she is upset about something. She denies any chest pain, palpitations, fatigue. She reports full compliance with her medications.  In regards to her blood sugars, she reports blood sugars have been well-controlled, and at times even low blood sugars in the 60s. She has tapered back on her glipizide to 5 mg twice daily.  She reports that she is generally feeling well. She has no new concerns today.  Outpatient Encounter Prescriptions as of 06/14/2012  Medication Sig Dispense Refill  . amLODipine (NORVASC) 10 MG tablet Take 1 tablet (10 mg total) by mouth daily.  90 tablet  4  . glipiZIDE (GLUCOTROL) 10 MG tablet Take 1 tablet (10 mg total) by mouth 2 (two) times daily before a meal.  180 tablet  4  . metFORMIN (GLUCOPHAGE) 500 MG tablet Take 1 tablet (500 mg total) by mouth 2 (two) times daily.  180 tablet  4  . metoprolol (LOPRESSOR) 100 MG tablet Take 1 tablet (100 mg total) by mouth 2 (two) times daily.  180 tablet  4  . pravastatin (PRAVACHOL) 20 MG tablet Take 1 tablet (20 mg total) by mouth daily.  90 tablet  4  . ranitidine (ZANTAC) 150 MG tablet Take 1 tablet (150 mg total) by mouth 2 (two) times daily.  180 tablet  4  . valsartan-hydrochlorothiazide (DIOVAN-HCT) 320-25 MG per tablet Take 1 tablet by mouth daily.  90 tablet  4   BP 130/90  Pulse 65  Temp 98.4 F (36.9 C) (Oral)  Ht 5' 1.5" (1.562 m)  Wt 122 lb (55.339 kg)  BMI 22.68 kg/m2  SpO2 98%  Review of Systems  Constitutional: Negative for fever, chills, appetite change, fatigue and unexpected weight change.  HENT:  Negative for ear pain, congestion, sore throat, trouble swallowing, neck pain, voice change and sinus pressure.   Eyes: Negative for visual disturbance.  Respiratory: Negative for cough, shortness of breath, wheezing and stridor.   Cardiovascular: Negative for chest pain, palpitations and leg swelling.  Gastrointestinal: Negative for nausea, vomiting, abdominal pain, diarrhea, constipation, blood in stool, abdominal distention and anal bleeding.  Genitourinary: Negative for dysuria and flank pain.  Musculoskeletal: Negative for myalgias, arthralgias and gait problem.  Skin: Negative for color change and rash.  Neurological: Negative for dizziness and headaches.  Hematological: Negative for adenopathy. Does not bruise/bleed easily.  Psychiatric/Behavioral: Negative for suicidal ideas, disturbed wake/sleep cycle and dysphoric mood. The patient is not nervous/anxious.        Objective:   Physical Exam  Constitutional: She is oriented to person, place, and time. She appears well-developed and well-nourished. No distress.  HENT:  Head: Normocephalic and atraumatic.  Right Ear: External ear normal.  Left Ear: External ear normal.  Nose: Nose normal.  Mouth/Throat: Oropharynx is clear and moist. No oropharyngeal exudate.  Eyes: Conjunctivae are normal. Pupils are equal, round, and reactive to light. Right eye exhibits no discharge. Left eye exhibits no discharge. No scleral icterus.  Neck: Normal range of motion. Neck supple. No tracheal deviation present. No thyromegaly present.  Cardiovascular: Normal rate, regular rhythm, normal heart sounds and intact distal pulses.  Exam reveals no gallop and no friction rub.   No murmur heard. Pulmonary/Chest: Effort normal and breath sounds normal. No respiratory distress. She has no wheezes. She has no rales. She exhibits no tenderness.  Musculoskeletal: Normal range of motion. She exhibits no edema and no tenderness.  Lymphadenopathy:    She has no  cervical adenopathy.  Neurological: She is alert and oriented to person, place, and time. No cranial nerve deficit. She exhibits normal muscle tone. Coordination normal.  Skin: Skin is warm and dry. No rash noted. She is not diaphoretic. No erythema. No pallor.  Psychiatric: She has a normal mood and affect. Her behavior is normal. Judgment and thought content normal.          Assessment & Plan:

## 2012-06-14 NOTE — Telephone Encounter (Signed)
Can you ask this pt to come earlier for labs, ie next week? I will be out on vacation the following week and wanted to get her labs back before I leave. Thanks

## 2012-06-14 NOTE — Assessment & Plan Note (Signed)
Blood pressure well-controlled. Will continue current medications. Noted increasing creatinine on last labs. Will plan to repeat renal function this month.

## 2012-06-14 NOTE — Assessment & Plan Note (Signed)
Blood sugars have been well-controlled. Patient has had a few low blood sugars. She has tapered down on glipizide to 5 mg twice daily. We'll plan to check A1c with labs next month.

## 2012-06-14 NOTE — Assessment & Plan Note (Signed)
Patient is started back on pravastatin. We'll plan to recheck lipids with labs next month.

## 2012-06-17 NOTE — Telephone Encounter (Signed)
Patient notified. She will come tomorrow for labs.

## 2012-06-18 ENCOUNTER — Other Ambulatory Visit (INDEPENDENT_AMBULATORY_CARE_PROVIDER_SITE_OTHER): Payer: Self-pay | Admitting: *Deleted

## 2012-06-18 DIAGNOSIS — E785 Hyperlipidemia, unspecified: Secondary | ICD-10-CM

## 2012-06-18 DIAGNOSIS — I10 Essential (primary) hypertension: Secondary | ICD-10-CM

## 2012-06-18 DIAGNOSIS — E119 Type 2 diabetes mellitus without complications: Secondary | ICD-10-CM

## 2012-06-18 LAB — LIPID PANEL
Cholesterol: 165 mg/dL (ref 0–200)
HDL: 52.6 mg/dL (ref >39.00)
LDL Cholesterol: 98 mg/dL (ref 0–99)
Triglycerides: 73 mg/dL (ref 0.0–149.0)

## 2012-06-18 LAB — COMPREHENSIVE METABOLIC PANEL
AST: 13 U/L (ref 0–37)
Albumin: 4 g/dL (ref 3.5–5.2)
Alkaline Phosphatase: 48 U/L (ref 39–117)
BUN: 16 mg/dL (ref 6–23)
Chloride: 103 mEq/L (ref 96–112)
Creatinine, Ser: 1.4 mg/dL — ABNORMAL HIGH (ref 0.4–1.2)
Glucose, Bld: 130 mg/dL — ABNORMAL HIGH (ref 70–99)
Sodium: 140 mEq/L (ref 135–145)

## 2012-07-03 ENCOUNTER — Telehealth: Payer: Self-pay | Admitting: Internal Medicine

## 2012-07-03 DIAGNOSIS — I10 Essential (primary) hypertension: Secondary | ICD-10-CM

## 2012-07-03 NOTE — Telephone Encounter (Signed)
[employer] forms to be filled out in box up front

## 2012-07-04 MED ORDER — VALSARTAN-HYDROCHLOROTHIAZIDE 320-25 MG PO TABS: 1.0000 | ORAL_TABLET | Freq: Every day | ORAL | Status: DC

## 2012-07-04 NOTE — Telephone Encounter (Signed)
Application and Rx faxed to [employer].

## 2012-08-29 ENCOUNTER — Emergency Department: Payer: Self-pay | Admitting: Emergency Medicine

## 2012-09-04 ENCOUNTER — Ambulatory Visit: Payer: Self-pay | Admitting: Internal Medicine

## 2012-09-13 ENCOUNTER — Ambulatory Visit: Payer: Self-pay | Admitting: Internal Medicine

## 2012-09-13 DIAGNOSIS — Z0289 Encounter for other administrative examinations: Secondary | ICD-10-CM

## 2012-10-04 ENCOUNTER — Encounter: Payer: Self-pay | Admitting: Internal Medicine

## 2012-10-04 ENCOUNTER — Ambulatory Visit (INDEPENDENT_AMBULATORY_CARE_PROVIDER_SITE_OTHER): Payer: Self-pay | Admitting: Internal Medicine

## 2012-10-04 VITALS — BP 130/74 | HR 64 | Temp 98.5°F | Ht 61.5 in | Wt 128.5 lb

## 2012-10-04 DIAGNOSIS — Z1239 Encounter for other screening for malignant neoplasm of breast: Secondary | ICD-10-CM

## 2012-10-04 DIAGNOSIS — E785 Hyperlipidemia, unspecified: Secondary | ICD-10-CM

## 2012-10-04 DIAGNOSIS — E119 Type 2 diabetes mellitus without complications: Secondary | ICD-10-CM

## 2012-10-04 DIAGNOSIS — I10 Essential (primary) hypertension: Secondary | ICD-10-CM

## 2012-10-04 DIAGNOSIS — N289 Disorder of kidney and ureter, unspecified: Secondary | ICD-10-CM

## 2012-10-05 LAB — LIPID PANEL
HDL: 46 mg/dL (ref >39)
LDL Cholesterol: 107 mg/dL — ABNORMAL HIGH (ref 0–99)
Total CHOL/HDL Ratio: 3.9 Ratio
VLDL: 25 mg/dL (ref 0–40)

## 2012-10-05 LAB — COMPREHENSIVE METABOLIC PANEL
ALT: 10 U/L (ref 0–35)
AST: 12 U/L (ref 0–37)
Albumin: 4.2 g/dL (ref 3.5–5.2)
Alkaline Phosphatase: 56 U/L (ref 39–117)
CO2: 27 mEq/L (ref 19–32)
Chloride: 100 mEq/L (ref 96–112)
Total Protein: 7.5 g/dL (ref 6.0–8.3)

## 2012-10-05 LAB — MICROALBUMIN / CREATININE URINE RATIO
Creatinine, Urine: 199.2 mg/dL
Microalb, Ur: 10.65 mg/dL — ABNORMAL HIGH (ref 0.00–1.89)

## 2012-10-05 NOTE — Assessment & Plan Note (Signed)
A1c stable at 7.1%. Will continue current medications. Follow up 3 months and prn.

## 2012-10-05 NOTE — Progress Notes (Signed)
Subjective:    Patient ID: Deborah [name], female    DOB: [birth year], 56 y.o.   MRN: [id number]  HPI 56YO female with h/o HTN, DM, HL presents for follow up. She reports she is doing well. She reports full compliance with her medications. She did not bring a record of her blood sugars today. She denies any symptoms of low blood sugars such as diaphoresis and fatigue. She denies any headache, chest pain, palpitations, dyspnea. She has been trying to follow a healthy diet and get regular physical activity.No new concerns today. Delines flu and pneumonia vaccinations.  Outpatient Encounter Prescriptions as of 10/04/2012  Medication Sig Dispense Refill  . amLODipine (NORVASC) 10 MG tablet Take 1 tablet (10 mg total) by mouth daily.  90 tablet  4  . glipiZIDE (GLUCOTROL) 10 MG tablet Take 1 tablet (10 mg total) by mouth 2 (two) times daily before a meal.  180 tablet  4  . metFORMIN (GLUCOPHAGE) 500 MG tablet Take 1 tablet (500 mg total) by mouth 2 (two) times daily.  180 tablet  4  . metoprolol (LOPRESSOR) 100 MG tablet Take 1 tablet (100 mg total) by mouth 2 (two) times daily.  180 tablet  4  . pravastatin (PRAVACHOL) 20 MG tablet Take 1 tablet (20 mg total) by mouth daily.  90 tablet  4  . ranitidine (ZANTAC) 150 MG tablet Take 1 tablet (150 mg total) by mouth 2 (two) times daily.  180 tablet  4  . valsartan-hydrochlorothiazide (DIOVAN-HCT) 320-25 MG per tablet Take 1 tablet by mouth daily.  90 tablet  3   BP 130/74  Pulse 64  Temp 98.5 F (36.9 C) (Oral)  Ht 5' 1.5" (1.562 m)  Wt 128 lb 8 oz (58.287 kg)  BMI 23.89 kg/m2  SpO2 96%  Review of Systems  Constitutional: Negative for fever, chills, appetite change, fatigue and unexpected weight change.  HENT: Negative for ear pain, congestion, sore throat, trouble swallowing, neck pain, voice change and sinus pressure.   Eyes: Negative for visual disturbance.  Respiratory: Negative for cough, shortness of breath, wheezing and stridor.     Cardiovascular: Negative for chest pain, palpitations and leg swelling.  Gastrointestinal: Negative for nausea, vomiting, abdominal pain, diarrhea, constipation, blood in stool, abdominal distention and anal bleeding.  Genitourinary: Negative for dysuria and flank pain.  Musculoskeletal: Negative for myalgias, arthralgias and gait problem.  Skin: Negative for color change and rash.  Neurological: Negative for dizziness and headaches.  Hematological: Negative for adenopathy. Does not bruise/bleed easily.  Psychiatric/Behavioral: Negative for suicidal ideas, disturbed wake/sleep cycle and dysphoric mood. The patient is not nervous/anxious.        Objective:   Physical Exam  Constitutional: She is oriented to person, place, and time. She appears well-developed and well-nourished. No distress.  HENT:  Head: Normocephalic and atraumatic.  Right Ear: External ear normal.  Left Ear: External ear normal.  Nose: Nose normal.  Mouth/Throat: Oropharynx is clear and moist. No oropharyngeal exudate.  Eyes: Conjunctivae normal are normal. Pupils are equal, round, and reactive to light. Right eye exhibits no discharge. Left eye exhibits no discharge. No scleral icterus.  Neck: Normal range of motion. Neck supple. No tracheal deviation present. No thyromegaly present.  Cardiovascular: Normal rate, regular rhythm, normal heart sounds and intact distal pulses.  Exam reveals no gallop and no friction rub.   No murmur heard. Pulmonary/Chest: Effort normal and breath sounds normal. No respiratory distress. She has no wheezes. She has no rales. She  exhibits no tenderness.  Musculoskeletal: Normal range of motion. She exhibits no edema and no tenderness.  Lymphadenopathy:    She has no cervical adenopathy.  Neurological: She is alert and oriented to person, place, and time. No cranial nerve deficit. She exhibits normal muscle tone. Coordination normal.  Skin: Skin is warm and dry. No rash noted. She is not  diaphoretic. No erythema. No pallor.  Psychiatric: She has a normal mood and affect. Her behavior is normal. Judgment and thought content normal.          Assessment & Plan:

## 2012-10-05 NOTE — Assessment & Plan Note (Signed)
Blood pressure well-controlled on current medications. We'll plan to continue. Follow up 3 months or sooner as needed

## 2012-10-05 NOTE — Assessment & Plan Note (Signed)
LDL above goal of <70. Will increase Pravastatin to 40mg  daily. Repeat lipids and LFTs in 3 months.

## 2012-10-05 NOTE — Assessment & Plan Note (Signed)
Renal function stable on labs. 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 Will continue to monitor. Continue Valsartan. Repeat renal function 3 months.

## 2012-10-08 ENCOUNTER — Other Ambulatory Visit: Payer: Self-pay | Admitting: *Deleted

## 2012-10-08 MED ORDER — PRAVASTATIN SODIUM 40 MG PO TABS: 40.0000 mg | ORAL_TABLET | Freq: Every day | ORAL | Status: DC

## 2013-01-09 ENCOUNTER — Ambulatory Visit: Payer: Self-pay | Admitting: Internal Medicine

## 2013-01-10 ENCOUNTER — Ambulatory Visit: Payer: Self-pay | Admitting: Internal Medicine

## 2013-01-31 ENCOUNTER — Ambulatory Visit (INDEPENDENT_AMBULATORY_CARE_PROVIDER_SITE_OTHER): Payer: Self-pay | Admitting: Internal Medicine

## 2013-01-31 ENCOUNTER — Encounter: Payer: Self-pay | Admitting: Internal Medicine

## 2013-01-31 VITALS — BP 130/80 | HR 70 | Temp 98.0°F | Wt 131.0 lb

## 2013-01-31 DIAGNOSIS — E118 Type 2 diabetes mellitus with unspecified complications: Secondary | ICD-10-CM

## 2013-01-31 DIAGNOSIS — E785 Hyperlipidemia, unspecified: Secondary | ICD-10-CM

## 2013-01-31 DIAGNOSIS — E1169 Type 2 diabetes mellitus with other specified complication: Secondary | ICD-10-CM

## 2013-01-31 DIAGNOSIS — I10 Essential (primary) hypertension: Secondary | ICD-10-CM

## 2013-01-31 DIAGNOSIS — K219 Gastro-esophageal reflux disease without esophagitis: Secondary | ICD-10-CM

## 2013-01-31 DIAGNOSIS — L97509 Non-pressure chronic ulcer of other part of unspecified foot with unspecified severity: Secondary | ICD-10-CM

## 2013-01-31 DIAGNOSIS — E119 Type 2 diabetes mellitus without complications: Secondary | ICD-10-CM

## 2013-01-31 LAB — LIPID PANEL
HDL: 49.4 mg/dL (ref >39.00)
Total CHOL/HDL Ratio: 3
VLDL: 18.4 mg/dL (ref 0.0–40.0)

## 2013-01-31 LAB — COMPREHENSIVE METABOLIC PANEL
ALT: 12 U/L (ref 0–35)
Alkaline Phosphatase: 57 U/L (ref 39–117)
Sodium: 138 mEq/L (ref 135–145)
Total Bilirubin: 0.6 mg/dL (ref 0.3–1.2)
Total Protein: 7.7 g/dL (ref 6.0–8.3)

## 2013-01-31 MED ORDER — METFORMIN HCL 500 MG PO TABS: 500.0000 mg | ORAL_TABLET | Freq: Two times a day (BID) | ORAL | Status: DC

## 2013-01-31 MED ORDER — VALSARTAN-HYDROCHLOROTHIAZIDE 320-25 MG PO TABS: 1.0000 | ORAL_TABLET | Freq: Every day | ORAL | Status: DC

## 2013-01-31 MED ORDER — GLIPIZIDE 10 MG PO TABS: 10.0000 mg | ORAL_TABLET | Freq: Two times a day (BID) | ORAL | Status: DC

## 2013-01-31 MED ORDER — PRAVASTATIN SODIUM 40 MG PO TABS: 40.0000 mg | ORAL_TABLET | Freq: Every day | ORAL | Status: DC

## 2013-01-31 MED ORDER — AMLODIPINE BESYLATE 10 MG PO TABS: 10.0000 mg | ORAL_TABLET | Freq: Every day | ORAL | Status: DC

## 2013-01-31 MED ORDER — METOPROLOL TARTRATE 100 MG PO TABS: 100.0000 mg | ORAL_TABLET | Freq: Two times a day (BID) | ORAL | Status: DC

## 2013-01-31 MED ORDER — RANITIDINE HCL 150 MG PO TABS: 150.0000 mg | ORAL_TABLET | Freq: Two times a day (BID) | ORAL | Status: DC

## 2013-01-31 NOTE — Assessment & Plan Note (Signed)
BP Readings from Last 3 Encounters:  01/31/13 130/80  10/04/12 130/74  06/14/12 130/90   BP well controlled on current medications. Will continue.

## 2013-01-31 NOTE — Progress Notes (Signed)
Subjective:    Patient ID: Deborah [name], female    DOB: [birth year], 57 y.o.   MRN: [id number]  HPI 57YO female presents for follow up.  DM - BG well controlled, typically 100-150 fasting. No BG >250 or <80. Compliant with meds and diet. Set up opthalmology evaluation through indigent care program at [facility]. Concerned about thickened toenails.  Having trouble cutting nails. No wounds on feet.  HTN - BP well controlled on current meds. No chest pain, headache, palpitations. Compliant with meds.  HL - Compliant with pravastatin. No side effects noted.  Outpatient Encounter Prescriptions as of 01/31/2013  Medication Sig Dispense Refill  . amLODipine (NORVASC) 10 MG tablet Take 1 tablet (10 mg total) by mouth daily.  90 tablet  4  . glipiZIDE (GLUCOTROL) 10 MG tablet Take 1 tablet (10 mg total) by mouth 2 (two) times daily before a meal.  180 tablet  4  . metFORMIN (GLUCOPHAGE) 500 MG tablet Take 1 tablet (500 mg total) by mouth 2 (two) times daily.  180 tablet  4  . metoprolol (LOPRESSOR) 100 MG tablet Take 1 tablet (100 mg total) by mouth 2 (two) times daily.  180 tablet  4  . pravastatin (PRAVACHOL) 40 MG tablet Take 1 tablet (40 mg total) by mouth daily.  90 tablet  4  . ranitidine (ZANTAC) 150 MG tablet Take 1 tablet (150 mg total) by mouth 2 (two) times daily.  180 tablet  4  . valsartan-hydrochlorothiazide (DIOVAN-HCT) 320-25 MG per tablet Take 1 tablet by mouth daily.  90 tablet  3   No facility-administered encounter medications on file as of 01/31/2013.    BP 130/80  Pulse 70  Temp(Src) 98 F (36.7 C) (Oral)  Wt 131 lb (59.421 kg)  BMI 24.35 kg/m2  SpO2 97%  Review of Systems  Constitutional: Negative for fever, chills, appetite change, fatigue and unexpected weight change.  HENT: Negative for ear pain, congestion, sore throat, trouble swallowing, neck pain, voice change and sinus pressure.   Eyes: Negative for visual disturbance.  Respiratory: Negative for  cough, shortness of breath, wheezing and stridor.   Cardiovascular: Negative for chest pain, palpitations and leg swelling.  Gastrointestinal: Negative for nausea, vomiting, abdominal pain, diarrhea, constipation, blood in stool, abdominal distention and anal bleeding.  Genitourinary: Negative for dysuria and flank pain.  Musculoskeletal: Negative for myalgias, arthralgias and gait problem.  Skin: Negative for color change and rash.  Neurological: Negative for dizziness and headaches.  Hematological: Negative for adenopathy. Does not bruise/bleed easily.  Psychiatric/Behavioral: Negative for suicidal ideas, sleep disturbance and dysphoric mood. The patient is not nervous/anxious.        Objective:   Physical Exam  Constitutional: She is oriented to person, place, and time. She appears well-developed and well-nourished. No distress.  HENT:  Head: Normocephalic and atraumatic.  Right Ear: External ear normal.  Left Ear: External ear normal.  Nose: Nose normal.  Mouth/Throat: Oropharynx is clear and moist. No oropharyngeal exudate.  Eyes: Conjunctivae are normal. Pupils are equal, round, and reactive to light. Right eye exhibits no discharge. Left eye exhibits no discharge. No scleral icterus.  Neck: Normal range of motion. Neck supple. No tracheal deviation present. No thyromegaly present.  Cardiovascular: Normal rate, regular rhythm, normal heart sounds and intact distal pulses.  Exam reveals no gallop and no friction rub.   No murmur heard. Pulmonary/Chest: Effort normal and breath sounds normal. No respiratory distress. She has no wheezes. She has no rales. She  exhibits no tenderness.  Musculoskeletal: Normal range of motion. She exhibits no edema and no tenderness.  Lymphadenopathy:    She has no cervical adenopathy.  Neurological: She is alert and oriented to person, place, and time. No cranial nerve deficit. She exhibits normal muscle tone. Coordination normal.  Skin: Skin is warm  and dry. No rash noted. She is not diaphoretic. No erythema. No pallor.  Psychiatric: She has a normal mood and affect. Her behavior is normal. Judgment and thought content normal.          Assessment & Plan:

## 2013-01-31 NOTE — Assessment & Plan Note (Addendum)
Lab Results  Component Value Date   HGBA1C 7.1* 10/04/2012    Will check A1c with labs today. Pt reports good control of BG. Will continue current medications. Monitor renal function closely,may need to consider stopping metformin if worsening CKD. Will set up podiatry evaluation.

## 2013-01-31 NOTE — Assessment & Plan Note (Signed)
Lab Results  Component Value Date   LDLCALC 85 01/31/2013   LDL improved on labs today. LFTs normal. Continue Pravastatin. Follow up 3 months.

## 2013-01-31 NOTE — Assessment & Plan Note (Signed)
Lab Results  Component Value Date   CREATININE 1.2 01/31/2013   Renal function improved on current labs. Will continue to monitor.

## 2013-05-12 ENCOUNTER — Encounter: Payer: Self-pay | Admitting: Internal Medicine

## 2013-06-29 ENCOUNTER — Emergency Department: Payer: Self-pay | Admitting: Emergency Medicine

## 2013-07-03 ENCOUNTER — Encounter: Payer: Self-pay | Admitting: Internal Medicine

## 2013-07-09 LAB — HM DIABETES EYE EXAM

## 2013-07-23 ENCOUNTER — Encounter: Payer: Self-pay | Admitting: Internal Medicine

## 2013-07-24 ENCOUNTER — Other Ambulatory Visit: Payer: Self-pay | Admitting: Internal Medicine

## 2013-07-24 NOTE — Telephone Encounter (Signed)
Eprescribed.

## 2013-09-22 ENCOUNTER — Other Ambulatory Visit: Payer: Self-pay | Admitting: *Deleted

## 2013-09-22 DIAGNOSIS — E119 Type 2 diabetes mellitus without complications: Secondary | ICD-10-CM

## 2013-09-22 MED ORDER — GLIPIZIDE 10 MG PO TABS: 10.0000 mg | ORAL_TABLET | Freq: Two times a day (BID) | ORAL | Status: DC

## 2013-09-22 NOTE — Telephone Encounter (Signed)
Appt 10/14/13

## 2013-10-01 ENCOUNTER — Encounter: Payer: Self-pay | Admitting: Internal Medicine

## 2013-10-14 ENCOUNTER — Ambulatory Visit (INDEPENDENT_AMBULATORY_CARE_PROVIDER_SITE_OTHER): Payer: Self-pay | Admitting: Internal Medicine

## 2013-10-14 ENCOUNTER — Other Ambulatory Visit (HOSPITAL_COMMUNITY)
Admission: RE | Admit: 2013-10-14 | Discharge: 2013-10-14 | Disposition: A | Discharge status: Home / Self Care | Payer: Self-pay | Source: Ambulatory Visit | Attending: Internal Medicine | Admitting: Internal Medicine

## 2013-10-14 ENCOUNTER — Encounter (INDEPENDENT_AMBULATORY_CARE_PROVIDER_SITE_OTHER): Payer: Self-pay

## 2013-10-14 ENCOUNTER — Encounter: Payer: Self-pay | Admitting: Internal Medicine

## 2013-10-14 VITALS — BP 114/72 | HR 69 | Temp 98.5°F | Ht 61.0 in | Wt 136.0 lb

## 2013-10-14 DIAGNOSIS — Z01419 Encounter for gynecological examination (general) (routine) without abnormal findings: Secondary | ICD-10-CM | POA: Insufficient documentation

## 2013-10-14 DIAGNOSIS — E119 Type 2 diabetes mellitus without complications: Secondary | ICD-10-CM

## 2013-10-14 DIAGNOSIS — I10 Essential (primary) hypertension: Secondary | ICD-10-CM

## 2013-10-14 DIAGNOSIS — E785 Hyperlipidemia, unspecified: Secondary | ICD-10-CM

## 2013-10-14 DIAGNOSIS — Z0000 Encounter for general adult medical examination without abnormal findings: Secondary | ICD-10-CM

## 2013-10-14 DIAGNOSIS — N183 Chronic kidney disease, stage 3 unspecified: Secondary | ICD-10-CM

## 2013-10-14 DIAGNOSIS — Z1151 Encounter for screening for human papillomavirus (HPV): Secondary | ICD-10-CM | POA: Insufficient documentation

## 2013-10-14 MED ORDER — METFORMIN HCL 500 MG PO TABS: 500.0000 mg | ORAL_TABLET | Freq: Two times a day (BID) | ORAL | Status: DC

## 2013-10-14 MED ORDER — VALSARTAN-HYDROCHLOROTHIAZIDE 320-25 MG PO TABS: ORAL_TABLET | ORAL | Status: DC

## 2013-10-14 MED ORDER — AMLODIPINE BESYLATE 10 MG PO TABS: 10.0000 mg | ORAL_TABLET | Freq: Every day | ORAL | Status: DC

## 2013-10-14 MED ORDER — METOPROLOL TARTRATE 100 MG PO TABS: 100.0000 mg | ORAL_TABLET | Freq: Two times a day (BID) | ORAL | Status: DC

## 2013-10-14 MED ORDER — GLIPIZIDE 10 MG PO TABS: 10.0000 mg | ORAL_TABLET | Freq: Two times a day (BID) | ORAL | Status: DC

## 2013-10-14 MED ORDER — PRAVASTATIN SODIUM 40 MG PO TABS: 40.0000 mg | ORAL_TABLET | Freq: Every day | ORAL | Status: DC

## 2013-10-14 NOTE — Assessment & Plan Note (Signed)
Pt reports good control of BG. Will check A1c with labs today. Continue current medications. Foot exam normal today.

## 2013-10-14 NOTE — Patient Instructions (Signed)

## 2013-10-14 NOTE — Progress Notes (Signed)
Pre-visit discussion using our clinic review tool. No additional management support is needed unless otherwise documented below in the visit note.  

## 2013-10-14 NOTE — Assessment & Plan Note (Signed)
Will check lipids and LFTs with labs today. 

## 2013-10-14 NOTE — Assessment & Plan Note (Signed)
General medical exam including breast and pelvic exam normal today. PAP is pending. Mammogram ordered. Flu and pneumovax declined. Will check labs including CBC, CMP, lipids, A1c, urine microalbumin.

## 2013-10-14 NOTE — Assessment & Plan Note (Signed)
BP Readings from Last 3 Encounters:  10/14/13 114/72  01/31/13 130/80  10/04/12 130/74   BP well controlled on current medication. Will continue. Will check renal function with labs today.

## 2013-10-14 NOTE — Progress Notes (Signed)
Subjective:    Patient ID: Deborah [name], female    DOB: November 23, 1956, 57 y.o.   MRN: [id number]  HPI 57 year old female with history of diabetes, hypertension, hyperlipidemia presents for annual exam. She reports that she is generally feeling well. She reports that blood sugars have been well-controlled however she did not bring record of blood sugars today. She is compliant with medications. She tries to follow a healthy diet. She gets some physical activity. She has no new concerns today.  Outpatient Prescriptions Prior to Visit  Medication Sig Dispense Refill  . ranitidine (ZANTAC) 150 MG tablet Take 1 tablet (150 mg total) by mouth 2 (two) times daily.  180 tablet  4  . amLODipine (NORVASC) 10 MG tablet Take 1 tablet (10 mg total) by mouth daily.  90 tablet  4  . glipiZIDE (GLUCOTROL) 10 MG tablet Take 1 tablet (10 mg total) by mouth 2 (two) times daily before a meal.  180 tablet  0  . metFORMIN (GLUCOPHAGE) 500 MG tablet Take 1 tablet (500 mg total) by mouth 2 (two) times daily.  180 tablet  4  . metoprolol (LOPRESSOR) 100 MG tablet Take 1 tablet (100 mg total) by mouth 2 (two) times daily.  180 tablet  4  . pravastatin (PRAVACHOL) 40 MG tablet Take 1 tablet (40 mg total) by mouth daily.  90 tablet  4  . valsartan-hydrochlorothiazide (DIOVAN-HCT) 320-25 MG per tablet TAKE 1 TABLET BY MOUTH DAILY  90 tablet  0   No facility-administered medications prior to visit.   BP 114/72  Pulse 69  Temp(Src) 98.5 F (36.9 C) (Oral)  Ht 5\' 1"  (1.549 m)  Wt 136 lb (61.689 kg)  BMI 25.71 kg/m2  SpO2 98%  Review of Systems  Constitutional: Negative for fever, chills, appetite change, fatigue and unexpected weight change.  HENT: Negative for congestion, ear pain, sinus pressure, sore throat, trouble swallowing and voice change.   Eyes: Negative for visual disturbance.  Respiratory: Negative for cough, shortness of breath, wheezing and stridor.   Cardiovascular: Negative for chest pain,  palpitations and leg swelling.  Gastrointestinal: Negative for nausea, vomiting, abdominal pain, diarrhea, constipation, blood in stool, abdominal distention and anal bleeding.  Genitourinary: Negative for dysuria and flank pain.  Musculoskeletal: Negative for arthralgias, gait problem, myalgias and neck pain.  Skin: Negative for color change and rash.  Neurological: Negative for dizziness and headaches.  Hematological: Negative for adenopathy. Does not bruise/bleed easily.  Psychiatric/Behavioral: Negative for suicidal ideas, sleep disturbance and dysphoric mood. The patient is not nervous/anxious.        Objective:   Physical Exam  Constitutional: She is oriented to person, place, and time. She appears well-developed and well-nourished. No distress.  HENT:  Head: Normocephalic and atraumatic.  Right Ear: External ear normal.  Left Ear: External ear normal.  Nose: Nose normal.  Mouth/Throat: Oropharynx is clear and moist. No oropharyngeal exudate.  Eyes: Conjunctivae are normal. Pupils are equal, round, and reactive to light. Right eye exhibits no discharge. Left eye exhibits no discharge. No scleral icterus.  Neck: Normal range of motion. Neck supple. No tracheal deviation present. No thyromegaly present.  Cardiovascular: Normal rate, regular rhythm, normal heart sounds and intact distal pulses.  Exam reveals no gallop and no friction rub.   No murmur heard. Pulmonary/Chest: Effort normal and breath sounds normal. No accessory muscle usage. Not tachypneic. No respiratory distress. She has no decreased breath sounds. She has no wheezes. She has no rhonchi. She has no rales.  She exhibits no tenderness.  Abdominal: Soft. Bowel sounds are normal. She exhibits no distension and no mass. There is no tenderness. There is no rebound and no guarding.  Genitourinary: Rectum normal, vagina normal and uterus normal. No breast swelling, tenderness, discharge or bleeding. Pelvic exam was performed with  patient supine. There is no rash, tenderness or lesion on the right labia. There is no rash, tenderness or lesion on the left labia. Uterus is not enlarged and not tender. Cervix exhibits no motion tenderness, no discharge and no friability. Right adnexum displays no mass, no tenderness and no fullness. Left adnexum displays no mass, no tenderness and no fullness. No erythema or tenderness around the vagina. No vaginal discharge found.  Musculoskeletal: Normal range of motion. She exhibits no edema and no tenderness.  Lymphadenopathy:    She has no cervical adenopathy.  Neurological: She is alert and oriented to person, place, and time. No cranial nerve deficit. She exhibits normal muscle tone. Coordination normal.  Skin: Skin is warm and dry. No rash noted. She is not diaphoretic. No erythema. No pallor.  Psychiatric: She has a normal mood and affect. Her behavior is normal. Judgment and thought content normal.          Assessment & Plan:

## 2013-10-15 LAB — MICROALBUMIN / CREATININE URINE RATIO
Creatinine,U: 246.3 mg/dL
Microalb Creat Ratio: 1.1 mg/g (ref 0.0–30.0)

## 2013-10-15 LAB — LIPID PANEL
Cholesterol: 156 mg/dL (ref 0–200)
HDL: 43.6 mg/dL (ref >39.00)
LDL Cholesterol: 91 mg/dL (ref 0–99)
Total CHOL/HDL Ratio: 4
Triglycerides: 106 mg/dL (ref 0.0–149.0)

## 2013-10-15 LAB — COMPREHENSIVE METABOLIC PANEL
AST: 15 U/L (ref 0–37)
Albumin: 4.1 g/dL (ref 3.5–5.2)
Alkaline Phosphatase: 53 U/L (ref 39–117)
BUN: 29 mg/dL — ABNORMAL HIGH (ref 6–23)
Calcium: 9 mg/dL (ref 8.4–10.5)
Creatinine, Ser: 1.8 mg/dL — ABNORMAL HIGH (ref 0.4–1.2)
Glucose, Bld: 108 mg/dL — ABNORMAL HIGH (ref 70–99)
Potassium: 3.8 mEq/L (ref 3.5–5.1)

## 2013-10-16 NOTE — Addendum Note (Signed)
Addended by: Ronette Deter A on: 10/16/2013 05:50 PM   Modules accepted: Orders

## 2013-10-17 ENCOUNTER — Encounter: Payer: Self-pay | Admitting: *Deleted

## 2013-10-31 ENCOUNTER — Other Ambulatory Visit: Payer: Self-pay | Admitting: Internal Medicine

## 2013-11-08 LAB — HM PAP SMEAR: HM Pap smear: NORMAL

## 2013-11-11 ENCOUNTER — Other Ambulatory Visit: Payer: Self-pay | Admitting: Internal Medicine

## 2013-11-11 DIAGNOSIS — Z1231 Encounter for screening mammogram for malignant neoplasm of breast: Secondary | ICD-10-CM

## 2013-11-20 ENCOUNTER — Other Ambulatory Visit (HOSPITAL_COMMUNITY)
Admission: RE | Admit: 2013-11-20 | Discharge: 2013-11-20 | Disposition: A | Discharge status: Home / Self Care | Payer: Self-pay | Source: Ambulatory Visit | Attending: Internal Medicine | Admitting: Internal Medicine

## 2013-11-20 ENCOUNTER — Encounter: Payer: Self-pay | Admitting: Internal Medicine

## 2013-11-20 ENCOUNTER — Ambulatory Visit (INDEPENDENT_AMBULATORY_CARE_PROVIDER_SITE_OTHER): Payer: Self-pay | Admitting: Internal Medicine

## 2013-11-20 VITALS — BP 120/78 | HR 68 | Temp 98.9°F | Wt 140.0 lb

## 2013-11-20 DIAGNOSIS — Z124 Encounter for screening for malignant neoplasm of cervix: Secondary | ICD-10-CM | POA: Insufficient documentation

## 2013-11-20 DIAGNOSIS — Z1151 Encounter for screening for human papillomavirus (HPV): Secondary | ICD-10-CM | POA: Insufficient documentation

## 2013-11-20 DIAGNOSIS — Z01419 Encounter for gynecological examination (general) (routine) without abnormal findings: Secondary | ICD-10-CM | POA: Insufficient documentation

## 2013-11-20 DIAGNOSIS — N289 Disorder of kidney and ureter, unspecified: Secondary | ICD-10-CM

## 2013-11-20 LAB — BASIC METABOLIC PANEL
BUN: 9 mg/dL (ref 6–23)
Chloride: 106 mEq/L (ref 96–112)
Potassium: 3.8 mEq/L (ref 3.5–5.1)

## 2013-11-20 NOTE — Assessment & Plan Note (Signed)
Previous PAP showed insufficient cellularity. Repeat PAP performed today.

## 2013-11-20 NOTE — Progress Notes (Signed)
Subjective:    Patient ID: Deborah [name], female    DOB: [birth year], 57 y.o.   MRN: [id number]  HPI 57YO female with DM, HTN presents for follow up PAP smear. Recent PAP had insufficient cells. No h/o abnormal PAP. No recent problems with vaginal pain, discharge, bleeding.  Also following up recent decline in kidney function. Compliant with medications. No change in UOP. No edema, dyspnea noted.  Outpatient Encounter Prescriptions as of 11/20/2013  Medication Sig  . amLODipine (NORVASC) 10 MG tablet Take 1 tablet (10 mg total) by mouth daily.  Marland Kitchen glipiZIDE (GLUCOTROL) 10 MG tablet Take 1 tablet (10 mg total) by mouth 2 (two) times daily before a meal.  . metFORMIN (GLUCOPHAGE) 500 MG tablet Take 1 tablet (500 mg total) by mouth 2 (two) times daily.  . metoprolol (LOPRESSOR) 100 MG tablet Take 1 tablet (100 mg total) by mouth 2 (two) times daily.  . pravastatin (PRAVACHOL) 40 MG tablet Take 1 tablet (40 mg total) by mouth daily.  . pravastatin (PRAVACHOL) 40 MG tablet TAKE ONE TABLET BY MOUTH EVERY DAY  . ranitidine (ZANTAC) 150 MG tablet Take 1 tablet (150 mg total) by mouth 2 (two) times daily.  . valsartan-hydrochlorothiazide (DIOVAN-HCT) 320-25 MG per tablet TAKE 1 TABLET BY MOUTH DAILY   BP 120/78  Pulse 68  Temp(Src) 98.9 F (37.2 C) (Oral)  Wt 140 lb (63.504 kg)  SpO2 99%  Review of Systems  Constitutional: Negative for fever, chills, appetite change, fatigue and unexpected weight change.  HENT: Negative for congestion, ear pain, sinus pressure, sore throat, trouble swallowing and voice change.   Eyes: Negative for visual disturbance.  Respiratory: Negative for cough, shortness of breath, wheezing and stridor.   Cardiovascular: Negative for chest pain, palpitations and leg swelling.  Gastrointestinal: Negative for nausea, vomiting, abdominal pain, diarrhea, constipation, blood in stool, abdominal distention and anal bleeding.  Genitourinary: Negative for dysuria and flank  pain.  Musculoskeletal: Negative for arthralgias, gait problem, myalgias and neck pain.  Skin: Negative for color change and rash.  Neurological: Negative for dizziness and headaches.  Hematological: Negative for adenopathy. Does not bruise/bleed easily.  Psychiatric/Behavioral: Negative for suicidal ideas, sleep disturbance and dysphoric mood. The patient is not nervous/anxious.        Objective:   Physical Exam  Constitutional: She is oriented to person, place, and time. She appears well-developed and well-nourished. No distress.  HENT:  Head: Normocephalic and atraumatic.  Right Ear: External ear normal.  Left Ear: External ear normal.  Nose: Nose normal.  Mouth/Throat: Oropharynx is clear and moist. No oropharyngeal exudate.  Eyes: Conjunctivae are normal. Pupils are equal, round, and reactive to light. Right eye exhibits no discharge. Left eye exhibits no discharge. No scleral icterus.  Neck: Normal range of motion. Neck supple. No tracheal deviation present. No thyromegaly present.  Cardiovascular: Normal rate, regular rhythm, normal heart sounds and intact distal pulses.  Exam reveals no gallop and no friction rub.   No murmur heard. Pulmonary/Chest: Effort normal and breath sounds normal. No accessory muscle usage. Not tachypneic. No respiratory distress. She has no decreased breath sounds. She has no wheezes. She has no rhonchi. She has no rales. She exhibits no tenderness.  Genitourinary: Vagina normal and uterus normal. Cervix exhibits no motion tenderness, no discharge and no friability.  Musculoskeletal: Normal range of motion. She exhibits no edema and no tenderness.  Lymphadenopathy:    She has no cervical adenopathy.  Neurological: She is alert and oriented to  person, place, and time. No cranial nerve deficit. She exhibits normal muscle tone. Coordination normal.  Skin: Skin is warm and dry. No rash noted. She is not diaphoretic. No erythema. No pallor.  Psychiatric: She  has a normal mood and affect. Her behavior is normal. Judgment and thought content normal.          Assessment & Plan:

## 2013-11-20 NOTE — Assessment & Plan Note (Signed)
Recent labs showed acute worsening of renal function. Repeat labs today show normal renal function. Suspect dehydration contributed on previous labs. Encouraged adequate fluid intake.

## 2013-11-20 NOTE — Progress Notes (Signed)
Pre-visit discussion using our clinic review tool. No additional management support is needed unless otherwise documented below in the visit note.  

## 2013-11-24 ENCOUNTER — Encounter: Payer: Self-pay | Admitting: *Deleted

## 2013-11-25 ENCOUNTER — Encounter: Payer: Self-pay | Admitting: *Deleted

## 2013-12-12 ENCOUNTER — Ambulatory Visit: Payer: Self-pay

## 2014-01-01 ENCOUNTER — Ambulatory Visit: Payer: Self-pay

## 2014-01-22 ENCOUNTER — Ambulatory Visit: Payer: Self-pay | Admitting: Internal Medicine

## 2014-02-16 ENCOUNTER — Other Ambulatory Visit: Payer: Self-pay | Admitting: Internal Medicine

## 2014-02-17 ENCOUNTER — Ambulatory Visit (INDEPENDENT_AMBULATORY_CARE_PROVIDER_SITE_OTHER): Payer: Self-pay | Admitting: Internal Medicine

## 2014-02-17 ENCOUNTER — Encounter: Payer: Self-pay | Admitting: Internal Medicine

## 2014-02-17 VITALS — BP 156/96 | HR 74 | Temp 98.2°F | Resp 16 | Wt 139.0 lb

## 2014-02-17 DIAGNOSIS — M545 Low back pain, unspecified: Secondary | ICD-10-CM | POA: Insufficient documentation

## 2014-02-17 DIAGNOSIS — N183 Chronic kidney disease, stage 3 unspecified: Secondary | ICD-10-CM

## 2014-02-17 DIAGNOSIS — E119 Type 2 diabetes mellitus without complications: Secondary | ICD-10-CM

## 2014-02-17 DIAGNOSIS — I10 Essential (primary) hypertension: Secondary | ICD-10-CM

## 2014-02-17 DIAGNOSIS — E785 Hyperlipidemia, unspecified: Secondary | ICD-10-CM

## 2014-02-17 LAB — HEMOGLOBIN A1C: Hgb A1c MFr Bld: 8.1 % — ABNORMAL HIGH (ref 4.6–6.5)

## 2014-02-17 LAB — LIPID PANEL
Cholesterol: 203 mg/dL — ABNORMAL HIGH (ref 0–200)
HDL: 49.9 mg/dL (ref >39.00)
LDL CALC: 138 mg/dL — AB (ref 0–99)
Total CHOL/HDL Ratio: 4
Triglycerides: 75 mg/dL (ref 0.0–149.0)
VLDL: 15 mg/dL (ref 0.0–40.0)

## 2014-02-17 LAB — COMPREHENSIVE METABOLIC PANEL
ALK PHOS: 57 U/L (ref 39–117)
ALT: 9 U/L (ref 0–35)
AST: 14 U/L (ref 0–37)
Albumin: 3.8 g/dL (ref 3.5–5.2)
BUN: 18 mg/dL (ref 6–23)
CO2: 24 mEq/L (ref 19–32)
Calcium: 8.9 mg/dL (ref 8.4–10.5)
Chloride: 106 mEq/L (ref 96–112)
Creatinine, Ser: 1.2 mg/dL (ref 0.4–1.2)
GFR: 60.2 mL/min (ref >60.00)
Glucose, Bld: 148 mg/dL — ABNORMAL HIGH (ref 70–99)
POTASSIUM: 3.5 meq/L (ref 3.5–5.1)
SODIUM: 138 meq/L (ref 135–145)
TOTAL PROTEIN: 7.6 g/dL (ref 6.0–8.3)
Total Bilirubin: 0.9 mg/dL (ref 0.3–1.2)

## 2014-02-17 LAB — MICROALBUMIN / CREATININE URINE RATIO
Creatinine,U: 188.8 mg/dL
MICROALB UR: 89.4 mg/dL — AB (ref 0.0–1.9)
Microalb Creat Ratio: 47.4 mg/g — ABNORMAL HIGH (ref 0.0–30.0)

## 2014-02-17 MED ORDER — METFORMIN HCL 500 MG PO TABS: 500.0000 mg | ORAL_TABLET | Freq: Two times a day (BID) | ORAL | Status: DC

## 2014-02-17 MED ORDER — VALSARTAN-HYDROCHLOROTHIAZIDE 320-25 MG PO TABS: ORAL_TABLET | ORAL | Status: DC

## 2014-02-17 NOTE — Assessment & Plan Note (Signed)
Will check A1c with labs today. Continue current medications. Foot exam normal today.

## 2014-02-17 NOTE — Assessment & Plan Note (Signed)
Will check lipids and LFTs with labs today. Continue Pravastatin.

## 2014-02-17 NOTE — Progress Notes (Signed)
Pre visit review using our clinic review tool, if applicable. No additional management support is needed unless otherwise documented below in the visit note. 

## 2014-02-17 NOTE — Progress Notes (Signed)
Subjective:    Patient ID: Deborah [name], female    DOB: 1956/03/08, 58 y.o.   MRN: [id number]  HPI 58YO female with DM, HTN, HL, and CKD presents for follow up.  DM - BG " up and down." Typically 130-140. Did not bring record today. Compliant with meds.  She is concerned about recent episodes of aching lower back pain. This typically occurs after she has been standing for prolonged period of time. She is not currently taking any medication for this. Pain improves with sitting down or resting.  Review of Systems  Constitutional: Negative for fever, chills, appetite change, fatigue and unexpected weight change.  HENT: Negative for congestion, ear pain, sinus pressure, sore throat, trouble swallowing and voice change.   Eyes: Negative for visual disturbance.  Respiratory: Negative for cough, shortness of breath, wheezing and stridor.   Cardiovascular: Negative for chest pain, palpitations and leg swelling.  Gastrointestinal: Negative for nausea, vomiting, abdominal pain, diarrhea, constipation, blood in stool, abdominal distention and anal bleeding.  Genitourinary: Negative for dysuria and flank pain.  Musculoskeletal: Positive for arthralgias, back pain and myalgias. Negative for gait problem and neck pain.  Skin: Negative for color change and rash.  Neurological: Negative for dizziness and headaches.  Hematological: Negative for adenopathy. Does not bruise/bleed easily.  Psychiatric/Behavioral: Negative for suicidal ideas, sleep disturbance and dysphoric mood. The patient is not nervous/anxious.        Objective:    BP 156/96  Pulse 74  Temp(Src) 98.2 F (36.8 C) (Oral)  Resp 16  Wt 139 lb (63.05 kg)  SpO2 97% Physical Exam  Constitutional: She is oriented to person, place, and time. She appears well-developed and well-nourished. No distress.  HENT:  Head: Normocephalic and atraumatic.  Right Ear: External ear normal.  Left Ear: External ear normal.  Nose: Nose normal.    Mouth/Throat: Oropharynx is clear and moist. No oropharyngeal exudate.  Eyes: Conjunctivae are normal. Pupils are equal, round, and reactive to light. Right eye exhibits no discharge. Left eye exhibits no discharge. No scleral icterus.  Neck: Normal range of motion. Neck supple. No tracheal deviation present. No thyromegaly present.  Cardiovascular: Normal rate, regular rhythm, normal heart sounds and intact distal pulses.  Exam reveals no gallop and no friction rub.   No murmur heard. Pulmonary/Chest: Effort normal and breath sounds normal. No accessory muscle usage. Not tachypneic. No respiratory distress. She has no decreased breath sounds. She has no wheezes. She has no rhonchi. She has no rales. She exhibits no tenderness.  Musculoskeletal: Normal range of motion. She exhibits no edema and no tenderness.       Lumbar back: She exhibits normal range of motion, no tenderness, no bony tenderness, no edema, no pain and no spasm.  Lymphadenopathy:    She has no cervical adenopathy.  Neurological: She is alert and oriented to person, place, and time. No cranial nerve deficit. She exhibits normal muscle tone. Coordination normal.  Skin: Skin is warm and dry. No rash noted. She is not diaphoretic. No erythema. No pallor.  Psychiatric: She has a normal mood and affect. Her behavior is normal. Judgment and thought content normal.          Assessment & Plan:   Problem List Items Addressed This Visit   Chronic kidney disease, stage 3     Will check renal function with labs today.    Diabetes mellitus type 2, controlled - Primary     Will check A1c with labs today.  Continue current medications. Foot exam normal today.    Relevant Medications      valsartan-hydrochlorothiazide (DIOVAN-HCT) 320-25 MG per tablet      metFORMIN (GLUCOPHAGE) tablet   Other Relevant Orders      Comprehensive metabolic panel      Hemoglobin A1c      Lipid panel      Microalbumin / creatinine urine ratio    Hyperlipidemia LDL goal < 70     Will check lipids and LFTs with labs today. Continue Pravastatin.    Hypertension       BP Readings from Last 3 Encounters:  02/17/14 156/96  11/20/13 120/78  10/14/13 114/72   BP elevated today, however pt just took BP meds while coming into clinic. Will have her monitor at home and call if persistently >140/90. Will check renal function today.    Low back pain     Symptoms most consistent with muscular strain. Exam is normal. Discussed potentially starting physical therapy however she does not have insurance at this time and cannot afford it. For now, we'll use Tylenol 1000 mg up to 3 times daily as needed for pain.        Return in about 3 months (around 05/20/2014) for Recheck of Diabetes.

## 2014-02-17 NOTE — Assessment & Plan Note (Signed)
Symptoms most consistent with muscular strain. Exam is normal. Discussed potentially starting physical therapy however she does not have insurance at this time and cannot afford it. For now, we'll use Tylenol 1000 mg up to 3 times daily as needed for pain.

## 2014-02-17 NOTE — Assessment & Plan Note (Signed)
  BP Readings from Last 3 Encounters:  02/17/14 156/96  11/20/13 120/78  10/14/13 114/72   BP elevated today, however pt just took BP meds while coming into clinic. Will have her monitor at home and call if persistently >140/90. Will check renal function today.

## 2014-02-17 NOTE — Assessment & Plan Note (Signed)
Will check renal function with labs today. 

## 2014-02-18 ENCOUNTER — Telehealth: Payer: Self-pay | Admitting: Internal Medicine

## 2014-02-18 NOTE — Telephone Encounter (Signed)
Relevant patient education mailed to patient.  

## 2014-02-20 ENCOUNTER — Telehealth: Payer: Self-pay | Admitting: Internal Medicine

## 2014-02-20 NOTE — Telephone Encounter (Signed)
The patient's daughter returned your call regarding the patient's labs

## 2014-02-23 ENCOUNTER — Other Ambulatory Visit: Payer: Self-pay | Admitting: Internal Medicine

## 2014-02-23 NOTE — Telephone Encounter (Signed)
Patient's daughter called and she was informed of patient's results as well.

## 2014-02-26 ENCOUNTER — Telehealth: Payer: Self-pay

## 2014-02-26 NOTE — Telephone Encounter (Signed)
Relevant patient education mailed to patient.  

## 2014-05-14 ENCOUNTER — Other Ambulatory Visit: Payer: Self-pay | Admitting: Internal Medicine

## 2014-05-26 ENCOUNTER — Ambulatory Visit: Payer: Self-pay | Admitting: Internal Medicine

## 2014-06-02 ENCOUNTER — Ambulatory Visit: Payer: Self-pay | Admitting: Internal Medicine

## 2014-06-16 ENCOUNTER — Encounter: Payer: Self-pay | Admitting: *Deleted

## 2014-06-16 NOTE — Progress Notes (Signed)
Chart reviewed for diabetic bundle. Last visit 02/17/14, has appointment already scheduled 06/25/14 with Dr. Gilford Rile for DM follow up and HTN.

## 2014-06-25 ENCOUNTER — Ambulatory Visit: Payer: Self-pay | Admitting: Internal Medicine

## 2014-06-26 ENCOUNTER — Ambulatory Visit: Payer: Self-pay | Admitting: Internal Medicine

## 2014-07-09 ENCOUNTER — Ambulatory Visit (INDEPENDENT_AMBULATORY_CARE_PROVIDER_SITE_OTHER): Payer: Self-pay | Admitting: Internal Medicine

## 2014-07-09 ENCOUNTER — Encounter: Payer: Self-pay | Admitting: Internal Medicine

## 2014-07-09 VITALS — BP 152/100 | HR 71 | Temp 98.3°F | Ht 61.0 in | Wt 143.5 lb

## 2014-07-09 DIAGNOSIS — N183 Chronic kidney disease, stage 3 unspecified: Secondary | ICD-10-CM

## 2014-07-09 DIAGNOSIS — I10 Essential (primary) hypertension: Secondary | ICD-10-CM

## 2014-07-09 DIAGNOSIS — E119 Type 2 diabetes mellitus without complications: Secondary | ICD-10-CM

## 2014-07-09 DIAGNOSIS — Z634 Disappearance and death of family member: Secondary | ICD-10-CM | POA: Insufficient documentation

## 2014-07-09 LAB — COMPREHENSIVE METABOLIC PANEL
ALT: 8 U/L (ref 0–35)
AST: 13 U/L (ref 0–37)
Albumin: 3.7 g/dL (ref 3.5–5.2)
Alkaline Phosphatase: 60 U/L (ref 39–117)
BUN: 16 mg/dL (ref 6–23)
CHLORIDE: 102 meq/L (ref 96–112)
CO2: 25 mEq/L (ref 19–32)
CREATININE: 1.1 mg/dL (ref 0.4–1.2)
Calcium: 9.2 mg/dL (ref 8.4–10.5)
GFR: 64.47 mL/min (ref >60.00)
Glucose, Bld: 114 mg/dL — ABNORMAL HIGH (ref 70–99)
Potassium: 3.5 mEq/L (ref 3.5–5.1)
Sodium: 135 mEq/L (ref 135–145)
Total Bilirubin: 0.7 mg/dL (ref 0.2–1.2)
Total Protein: 7.7 g/dL (ref 6.0–8.3)

## 2014-07-09 LAB — HM COLONOSCOPY

## 2014-07-09 LAB — LIPID PANEL
CHOLESTEROL: 207 mg/dL — AB (ref 0–200)
HDL: 40.6 mg/dL (ref >39.00)
LDL Cholesterol: 132 mg/dL — ABNORMAL HIGH (ref 0–99)
NonHDL: 166.4
TRIGLYCERIDES: 174 mg/dL — AB (ref 0.0–149.0)
Total CHOL/HDL Ratio: 5
VLDL: 34.8 mg/dL (ref 0.0–40.0)

## 2014-07-09 LAB — MICROALBUMIN / CREATININE URINE RATIO
Creatinine,U: 64.6 mg/dL
Microalb Creat Ratio: 24.5 mg/g (ref 0.0–30.0)
Microalb, Ur: 15.8 mg/dL — ABNORMAL HIGH (ref 0.0–1.9)

## 2014-07-09 LAB — HM DIABETES FOOT EXAM: HM DIABETIC FOOT EXAM: NORMAL

## 2014-07-09 LAB — HEMOGLOBIN A1C: Hgb A1c MFr Bld: 7.6 % — ABNORMAL HIGH (ref 4.6–6.5)

## 2014-07-09 MED ORDER — METFORMIN HCL 500 MG PO TABS: 500.0000 mg | ORAL_TABLET | Freq: Two times a day (BID) | ORAL | Status: DC

## 2014-07-09 MED ORDER — GLIPIZIDE 10 MG PO TABS: 10.0000 mg | ORAL_TABLET | Freq: Two times a day (BID) | ORAL | Status: DC

## 2014-07-09 NOTE — Progress Notes (Signed)
Pre visit review using our clinic review tool, if applicable. No additional management support is needed unless otherwise documented below in the visit note. 

## 2014-07-09 NOTE — Assessment & Plan Note (Signed)
BP Readings from Last 3 Encounters:  07/09/14 152/100  02/17/14 156/96  11/20/13 120/78   BP elevated today. Pt upset about dog passing away. Will have her RTC next week to recheck. If continues to be elevated, will plan to add medication.

## 2014-07-09 NOTE — Assessment & Plan Note (Signed)
Will check renal function with labs today. 

## 2014-07-09 NOTE — Progress Notes (Signed)
Subjective:    Patient ID: Deborah [name], female    DOB: 11-03-1956, 58 y.o.   MRN: [id number]  HPI 58YO female presents for follow up.  DM - BG fluctuates 90-100s at times. Compliant with meds.  Tough time for her. Daughter's dog recently died.  Review of Systems  Constitutional: Negative for fever, chills, appetite change, fatigue and unexpected weight change.  Eyes: Negative for visual disturbance.  Respiratory: Negative for shortness of breath.   Cardiovascular: Negative for chest pain and leg swelling.  Gastrointestinal: Negative for abdominal pain.  Skin: Negative for color change and rash.  Hematological: Negative for adenopathy. Does not bruise/bleed easily.  Psychiatric/Behavioral: Positive for dysphoric mood. The patient is nervous/anxious.        Objective:    BP 152/100  Pulse 71  Temp(Src) 98.3 F (36.8 C) (Oral)  Ht 5\' 1"  (1.549 m)  Wt 143 lb 8 oz (65.091 kg)  BMI 27.13 kg/m2  SpO2 97% Physical Exam  Constitutional: She is oriented to person, place, and time. She appears well-developed and well-nourished. No distress.  HENT:  Head: Normocephalic and atraumatic.  Right Ear: External ear normal.  Left Ear: External ear normal.  Nose: Nose normal.  Mouth/Throat: Oropharynx is clear and moist. No oropharyngeal exudate.  Eyes: Conjunctivae are normal. Pupils are equal, round, and reactive to light. Right eye exhibits no discharge. Left eye exhibits no discharge. No scleral icterus.  Neck: Normal range of motion. Neck supple. No tracheal deviation present. No thyromegaly present.  Cardiovascular: Normal rate, regular rhythm, normal heart sounds and intact distal pulses.  Exam reveals no gallop and no friction rub.   No murmur heard. Pulmonary/Chest: Effort normal and breath sounds normal. No accessory muscle usage. Not tachypneic. No respiratory distress. She has no decreased breath sounds. She has no wheezes. She has no rhonchi. She has no rales. She exhibits  no tenderness.  Musculoskeletal: Normal range of motion. She exhibits no edema and no tenderness.  Lymphadenopathy:    She has no cervical adenopathy.  Neurological: She is alert and oriented to person, place, and time. No cranial nerve deficit. She exhibits normal muscle tone. Coordination normal.  Skin: Skin is warm and dry. No rash noted. She is not diaphoretic. No erythema. No pallor.  Psychiatric: She has a normal mood and affect. Her behavior is normal. Judgment and thought content normal.          Assessment & Plan:   Problem List Items Addressed This Visit     Unprioritized   Bereavement     Offered support today. Encouraged her to consider Hospice for pet loss.    Chronic kidney disease, stage 3     Will check renal function with labs today.    Diabetes mellitus type 2, controlled - Primary      Lab Results  Component Value Date   HGBA1C 8.1* 02/17/2014   Will recheck A1c with labs today. Foot exam normal today.    Relevant Medications      glipiZIDE (GLUCOTROL) tablet      metFORMIN (GLUCOPHAGE) tablet   Other Relevant Orders      Comprehensive metabolic panel      Hemoglobin A1c      Lipid panel      Microalbumin / creatinine urine ratio   Hypertension      BP Readings from Last 3 Encounters:  07/09/14 152/100  02/17/14 156/96  11/20/13 120/78   BP elevated today. Pt upset about dog passing away.  Will have her RTC next week to recheck. If continues to be elevated, will plan to add medication.        Return in about 3 months (around 10/09/2014) for Recheck of Diabetes.

## 2014-07-09 NOTE — Assessment & Plan Note (Signed)
Offered support today. Encouraged her to consider Hospice for pet loss.

## 2014-07-09 NOTE — Patient Instructions (Signed)
Labs today.  Follow up next week for blood pressure check.

## 2014-07-09 NOTE — Assessment & Plan Note (Signed)
Lab Results  Component Value Date   HGBA1C 8.1* 02/17/2014   Will recheck A1c with labs today. Foot exam normal today.

## 2014-07-10 ENCOUNTER — Other Ambulatory Visit: Payer: Self-pay | Admitting: *Deleted

## 2014-07-10 MED ORDER — ATORVASTATIN CALCIUM 20 MG PO TABS: 20.0000 mg | ORAL_TABLET | Freq: Every day | ORAL | Status: DC

## 2014-07-16 ENCOUNTER — Ambulatory Visit: Payer: Self-pay | Admitting: *Deleted

## 2014-07-16 ENCOUNTER — Telehealth: Payer: Self-pay | Admitting: *Deleted

## 2014-07-16 VITALS — BP 162/84 | HR 62

## 2014-07-16 DIAGNOSIS — I10 Essential (primary) hypertension: Secondary | ICD-10-CM

## 2014-07-16 NOTE — Progress Notes (Unsigned)
Pt presents for BP check. 170/88 HR 62. Pt rested x 5 minutes then rechecked 162/84. Pt doing well without complaints. Dr. Gilford Rile notified.

## 2014-07-16 NOTE — Telephone Encounter (Signed)
Left message for pt to return my call.

## 2014-07-16 NOTE — Telephone Encounter (Signed)
Her BP is much higher than previous. Has she been compliant with medications? If yes, I would like to set up referral to Nephrology to evaluate her kidneys with [place].

## 2014-07-16 NOTE — Telephone Encounter (Signed)
Pt presents for BP check. 170/88 HR 62. Pt rested x 5 minutes then rechecked 162/84. Pt doing well without complaints. Dr. Gilford Rile notified.

## 2014-07-20 NOTE — Telephone Encounter (Addendum)
Pt notified. Wants to think about referral and will call office back. Confirmed she is taking all medications as prescribed

## 2014-10-15 ENCOUNTER — Ambulatory Visit: Payer: Self-pay | Admitting: Internal Medicine

## 2014-11-25 ENCOUNTER — Ambulatory Visit: Payer: Self-pay | Admitting: Internal Medicine

## 2014-12-28 ENCOUNTER — Ambulatory Visit: Payer: Self-pay | Admitting: Internal Medicine

## 2015-01-21 ENCOUNTER — Other Ambulatory Visit: Payer: Self-pay | Admitting: Internal Medicine

## 2015-01-25 ENCOUNTER — Emergency Department: Payer: Self-pay | Admitting: Emergency Medicine

## 2015-01-25 ENCOUNTER — Encounter: Payer: Self-pay | Admitting: Internal Medicine

## 2015-01-25 ENCOUNTER — Ambulatory Visit (INDEPENDENT_AMBULATORY_CARE_PROVIDER_SITE_OTHER): Payer: Self-pay | Admitting: Internal Medicine

## 2015-01-25 VITALS — BP 218/109 | HR 70 | Temp 98.2°F | Ht 61.0 in | Wt 147.5 lb

## 2015-01-25 DIAGNOSIS — E785 Hyperlipidemia, unspecified: Secondary | ICD-10-CM

## 2015-01-25 DIAGNOSIS — I160 Hypertensive urgency: Secondary | ICD-10-CM

## 2015-01-25 DIAGNOSIS — Z9119 Patient's noncompliance with other medical treatment and regimen: Secondary | ICD-10-CM

## 2015-01-25 DIAGNOSIS — Z91199 Patient's noncompliance with other medical treatment and regimen due to unspecified reason: Secondary | ICD-10-CM | POA: Insufficient documentation

## 2015-01-25 DIAGNOSIS — E119 Type 2 diabetes mellitus without complications: Secondary | ICD-10-CM

## 2015-01-25 DIAGNOSIS — I10 Essential (primary) hypertension: Secondary | ICD-10-CM

## 2015-01-25 MED ORDER — VALSARTAN-HYDROCHLOROTHIAZIDE 320-25 MG PO TABS: 1.0000 | ORAL_TABLET | Freq: Every day | ORAL | Status: DC

## 2015-01-25 MED ORDER — AMLODIPINE BESYLATE 10 MG PO TABS: 10.0000 mg | ORAL_TABLET | Freq: Every day | ORAL | Status: DC

## 2015-01-25 MED ORDER — CLONIDINE HCL 0.1 MG PO TABS
0.1000 mg | ORAL_TABLET | Freq: Once | ORAL | Status: AC
  Administered 2015-01-25: 0.1 mg via ORAL

## 2015-01-25 NOTE — Assessment & Plan Note (Addendum)
BP Readings from Last 3 Encounters:  01/25/15 218/109  07/16/14 162/84  07/09/14 152/100   BP markedly elevated. Pt is non-compliant with medications. Has been out of meds. Canceled last 3 visits. Given Clonidine 0.1mg  po x1 today. Repeat BP 180/100. EKG shows inferior ST depression. Will send to ED for evaluation including cardiac markers. Pt refuses EMS transport. Daughter will transport. Called ED to make them aware.

## 2015-01-25 NOTE — Assessment & Plan Note (Signed)
Will check A1c with labs today. Continue metformin.

## 2015-01-25 NOTE — Addendum Note (Signed)
Addended by: Vernetta Honey on: 01/25/2015 04:40 PM   Modules accepted: Orders

## 2015-01-25 NOTE — Progress Notes (Signed)
Pre visit review using our clinic review tool, if applicable. No additional management support is needed unless otherwise documented below in the visit note. 

## 2015-01-25 NOTE — Assessment & Plan Note (Signed)
Encouraged better compliance with medications. Discussed risks of non-compliance and elevated BP including stroke, MI and death.

## 2015-01-25 NOTE — Patient Instructions (Addendum)
Proceed to the [facility] ED for evaluation.  Your blood pressure is extremely high. It is important to take your medications regularly.  Please pick up medications at [facility] today and start back on Amlodipine 10mg  tonight.

## 2015-01-25 NOTE — Progress Notes (Signed)
Subjective:    Patient ID: Deborah [name], female    DOB: [birth year], 59 y.o.   MRN: [id number]  HPI  59YO female presents for follow up.  DM - BG have been mostly near 100. Rarely checks. Compliant with metformin.  HTN - Has been out of Valsartan and Amlodipine. No chest pain, headache, palpitations.    Past medical, surgical, family and social history per today's encounter.  Review of Systems  Constitutional: Positive for fatigue. Negative for fever, chills, appetite change and unexpected weight change.  Eyes: Negative for visual disturbance.  Respiratory: Negative for shortness of breath.   Cardiovascular: Negative for chest pain, palpitations and leg swelling.  Gastrointestinal: Negative for abdominal pain.  Skin: Negative for color change and rash.  Neurological: Negative for tremors, weakness and headaches.  Hematological: Negative for adenopathy. Does not bruise/bleed easily.  Psychiatric/Behavioral: Negative for sleep disturbance and dysphoric mood. The patient is not nervous/anxious.        Objective:    BP 218/109 mmHg  Pulse 70  Temp(Src) 98.2 F (36.8 C) (Oral)  Ht 5\' 1"  (1.549 m)  Wt 147 lb 8 oz (66.906 kg)  BMI 27.88 kg/m2  SpO2 99% Physical Exam  Constitutional: She is oriented to person, place, and time. She appears well-developed and well-nourished. No distress.  HENT:  Head: Normocephalic and atraumatic.  Right Ear: External ear normal.  Left Ear: External ear normal.  Nose: Nose normal.  Mouth/Throat: Oropharynx is clear and moist.  Eyes: Conjunctivae are normal. Pupils are equal, round, and reactive to light. Right eye exhibits no discharge. Left eye exhibits no discharge. No scleral icterus.  Neck: Normal range of motion. Neck supple. No tracheal deviation present. No thyromegaly present.  Cardiovascular: Normal rate, regular rhythm, normal heart sounds and intact distal pulses.  Exam reveals no gallop and no friction rub.   No murmur  heard. Pulmonary/Chest: Effort normal and breath sounds normal. No respiratory distress. She has no wheezes. She has no rales. She exhibits no tenderness.  Musculoskeletal: Normal range of motion. She exhibits no edema or tenderness.  Lymphadenopathy:    She has no cervical adenopathy.  Neurological: She is alert and oriented to person, place, and time. No cranial nerve deficit. She exhibits normal muscle tone. Coordination normal.  Skin: Skin is warm and dry. No rash noted. She is not diaphoretic. No erythema. No pallor.  Psychiatric: She has a normal mood and affect. Her behavior is normal. Judgment and thought content normal.          Assessment & Plan:   Problem List Items Addressed This Visit      Unprioritized   Diabetes mellitus type 2, controlled    Will check A1c with labs today. Continue metformin.      Relevant Medications   valsartan-hydrochlorothiazide (DIOVAN-HCT) 320-25 MG per tablet   Other Relevant Orders   Comprehensive metabolic panel   Hemoglobin A1c   Hyperlipidemia    Will check lipids and LFTs with labs today. Continue Atorvastatin.      Relevant Medications   amLODIpine (NORVASC) tablet   valsartan-hydrochlorothiazide (DIOVAN-HCT) 320-25 MG per tablet   Other Relevant Orders   Lipid panel   Hypertension - Primary    BP Readings from Last 3 Encounters:  01/25/15 218/109  07/16/14 162/84  07/09/14 152/100   BP markedly elevated. Pt is non-compliant with medications. Has been out of meds. Canceled last 3 visits. Given Clonidine 0.1mg  po x1 today. Repeat BP 180/100. EKG shows inferior ST  depression. Will send to ED for evaluation including cardiac markers. Pt refuses EMS transport. Daughter will transport. Called ED to make them aware.      Relevant Medications   amLODIpine (NORVASC) tablet   valsartan-hydrochlorothiazide (DIOVAN-HCT) 320-25 MG per tablet   Noncompliance    Encouraged better compliance with medications. Discussed risks of  non-compliance and elevated BP including stroke, MI and death.          Return in about 1 day (around 01/26/2015) for Recheck of Blood Pressure.

## 2015-01-25 NOTE — Assessment & Plan Note (Signed)
Will check lipids and LFTs with labs today. Continue Atorvastatin. 

## 2015-04-20 ENCOUNTER — Other Ambulatory Visit: Payer: Self-pay | Admitting: Internal Medicine

## 2015-05-15 ENCOUNTER — Other Ambulatory Visit: Payer: Self-pay | Admitting: Internal Medicine

## 2015-06-09 ENCOUNTER — Telehealth: Payer: Self-pay

## 2015-06-09 NOTE — Telephone Encounter (Signed)
Called pt about scheduling a mammogram. She is going to call and see if she can schedule because she doesn't have insurance and doesn't know if they will offer assistance. She will call and let me know if she scheduled so I can document it.

## 2015-06-30 ENCOUNTER — Ambulatory Visit
Admission: RE | Admit: 2015-06-30 | Discharge: 2015-06-30 | Disposition: A | Discharge status: Home / Self Care | Payer: Self-pay | Source: Ambulatory Visit | Attending: Oncology | Admitting: Oncology

## 2015-06-30 ENCOUNTER — Ambulatory Visit: Payer: Self-pay | Attending: Oncology

## 2015-06-30 VITALS — BP 143/90 | HR 72 | Temp 99.3°F | Resp 18 | Ht 61.81 in | Wt 149.6 lb

## 2015-06-30 DIAGNOSIS — Z0000 Encounter for general adult medical examination without abnormal findings: Secondary | ICD-10-CM

## 2015-06-30 NOTE — Progress Notes (Signed)
Subjective:     Patient ID: MIAMI MERCHAN, female   DOB: 1956-01-04, 59 y.o.   MRN: [id number]  HPI   Review of Systems     Objective:   Physical Exam  Pulmonary/Chest: Right breast exhibits no inverted nipple, no mass, no nipple discharge, no skin change and no tenderness. Left breast exhibits no inverted nipple, no mass, no nipple discharge, no skin change and no tenderness. Breasts are symmetrical.       Assessment:     59 year old patient presents for [facility] clinic visit .  Patient screened, and meets BCCCP eligibility.  Patient does not have insurance, Medicare or Medicaid.  Handout given on Affordable Care Act.CBE unremarkable.    Instructed patient on breast self-exam using teach back methodInstructed patient on breast self-exam using teach back method    Plan:     Sent for bilateral screening mammogram.

## 2015-07-14 ENCOUNTER — Telehealth: Payer: Self-pay | Admitting: *Deleted

## 2015-07-14 NOTE — Telephone Encounter (Signed)
Patient wants to be advised by Dr. Gilford Rile on what cough medicine that she can take,dur to her health issue. Thanks

## 2015-07-14 NOTE — Telephone Encounter (Signed)
Fine to use OTC Robitussin DM.

## 2015-07-14 NOTE — Telephone Encounter (Signed)
Notified pt that is was fine to take Robitussin DM, per Dr. Gilford Rile.

## 2015-07-20 NOTE — Progress Notes (Signed)
Letter mailed from [facility] to notify of normal mammogram results.  Patient to return in one year for annual screening.Patient to return in one year for annual screening.  Copy to [facility].

## 2015-09-22 ENCOUNTER — Telehealth: Payer: Self-pay | Admitting: *Deleted

## 2015-09-22 NOTE — Telephone Encounter (Signed)
Patient needed a medication refill on metoprolol. Patient stated that this medication has to be authorized by physician.

## 2015-09-22 NOTE — Telephone Encounter (Signed)
Fine to refill 

## 2015-09-23 ENCOUNTER — Other Ambulatory Visit: Payer: Self-pay | Admitting: Internal Medicine

## 2015-09-24 ENCOUNTER — Other Ambulatory Visit: Payer: Self-pay | Admitting: Internal Medicine

## 2015-12-24 ENCOUNTER — Other Ambulatory Visit: Payer: Self-pay | Admitting: Internal Medicine

## 2016-01-22 ENCOUNTER — Other Ambulatory Visit: Payer: Self-pay | Admitting: Internal Medicine

## 2016-02-15 ENCOUNTER — Other Ambulatory Visit: Payer: Self-pay | Admitting: Internal Medicine

## 2016-03-20 ENCOUNTER — Other Ambulatory Visit: Payer: Self-pay | Admitting: Internal Medicine

## 2016-03-20 MED ORDER — AMLODIPINE BESYLATE 10 MG PO TABS: 10.0000 mg | ORAL_TABLET | Freq: Every day | ORAL | Status: DC

## 2016-03-20 MED ORDER — VALSARTAN-HYDROCHLOROTHIAZIDE 320-25 MG PO TABS: 1.0000 | ORAL_TABLET | Freq: Every day | ORAL | Status: DC

## 2016-03-20 NOTE — Telephone Encounter (Signed)
Pt is requesting a refill on her amLODipine (NORVASC) 10 MG tablet and valsartan-hydrochlorothiazide (DIOVAN-HCT) 320-25 MG tablet. Pt's pharmacy is [facility] [address], [place].

## 2016-03-20 NOTE — Telephone Encounter (Signed)
Last OV was 01/25/15, Please advise for refill as Dr. Gilford Rile is out of the office. Thanks

## 2016-05-17 ENCOUNTER — Telehealth: Payer: Self-pay | Admitting: Internal Medicine

## 2016-05-17 ENCOUNTER — Other Ambulatory Visit: Payer: Self-pay | Admitting: *Deleted

## 2016-05-17 MED ORDER — METFORMIN HCL 500 MG PO TABS: 500.0000 mg | ORAL_TABLET | Freq: Two times a day (BID) | ORAL | Status: DC

## 2016-05-17 MED ORDER — METOPROLOL TARTRATE 100 MG PO TABS: 100.0000 mg | ORAL_TABLET | Freq: Two times a day (BID) | ORAL | Status: DC

## 2016-05-17 NOTE — Telephone Encounter (Signed)
Please advise for metformin scrip, last OV was in February. thanks

## 2016-05-17 NOTE — Telephone Encounter (Addendum)
Patient stated that she received the the arthorization for the metoprolol, however she did not receive and update on her metformin.   Pt contact [phone]

## 2016-05-17 NOTE — Addendum Note (Signed)
Addended by: Bevelyn Ngo on: 05/17/2016 05:00 PM   Modules accepted: Orders

## 2016-05-17 NOTE — Telephone Encounter (Signed)
Attempted to call patient back but unable to leave a message due to voicemail box has not been set up yet.

## 2016-05-17 NOTE — Telephone Encounter (Signed)
Needs follow up visit. 3min May refill x 1 month. Fine to put her with a new provider, given I will be leaving.

## 2016-05-17 NOTE — Telephone Encounter (Signed)
Spoke with patient and refill of metformin was sent to the pharmacy.

## 2016-05-17 NOTE — Telephone Encounter (Signed)
Pt lvm stating that one of her rx's need an authorization. She did not leave the name of the rx but states to please give her a cb and she will give all the information. Pt cb [phone]

## 2016-05-17 NOTE — Telephone Encounter (Signed)
Patient requested prior authorizations for medications metformin and  Metoprolol. Pharmacy [facility] [address]

## 2016-05-18 MED ORDER — METFORMIN HCL 500 MG PO TABS: 500.0000 mg | ORAL_TABLET | Freq: Two times a day (BID) | ORAL | Status: DC

## 2016-05-18 NOTE — Addendum Note (Signed)
Addended by: Bevelyn Ngo on: 05/18/2016 08:25 AM   Modules accepted: Orders

## 2016-05-18 NOTE — Telephone Encounter (Signed)
Refilled, thanks

## 2016-08-15 ENCOUNTER — Inpatient Hospital Stay: Payer: Medicaid Other

## 2016-08-15 ENCOUNTER — Encounter: Payer: Self-pay | Admitting: Internal Medicine

## 2016-08-15 ENCOUNTER — Encounter: Payer: Self-pay | Admitting: Emergency Medicine

## 2016-08-15 ENCOUNTER — Encounter
Admission: EM | Disposition: A | Discharge status: Other Acute Inpatient Hospital | Payer: Self-pay | Source: Home / Self Care | Attending: Internal Medicine

## 2016-08-15 ENCOUNTER — Inpatient Hospital Stay
Admit: 2016-08-15 | Discharge: 2016-08-15 | Disposition: A | Discharge status: Home / Self Care | Payer: Medicaid Other | Attending: Internal Medicine | Admitting: Internal Medicine

## 2016-08-15 ENCOUNTER — Inpatient Hospital Stay
Admission: EM | Admit: 2016-08-15 | Discharge: 2016-08-18 | DRG: 246 | Disposition: A | Discharge status: Other Acute Inpatient Hospital | Payer: Medicaid Other | Attending: Internal Medicine | Admitting: Internal Medicine

## 2016-08-15 DIAGNOSIS — E1122 Type 2 diabetes mellitus with diabetic chronic kidney disease: Secondary | ICD-10-CM | POA: Diagnosis present

## 2016-08-15 DIAGNOSIS — Z803 Family history of malignant neoplasm of breast: Secondary | ICD-10-CM

## 2016-08-15 DIAGNOSIS — J9690 Respiratory failure, unspecified, unspecified whether with hypoxia or hypercapnia: Secondary | ICD-10-CM | POA: Diagnosis present

## 2016-08-15 DIAGNOSIS — T383X6A Underdosing of insulin and oral hypoglycemic [antidiabetic] drugs, initial encounter: Secondary | ICD-10-CM | POA: Diagnosis present

## 2016-08-15 DIAGNOSIS — I5031 Acute diastolic (congestive) heart failure: Secondary | ICD-10-CM | POA: Diagnosis not present

## 2016-08-15 DIAGNOSIS — N141 Nephropathy induced by other drugs, medicaments and biological substances: Secondary | ICD-10-CM | POA: Diagnosis not present

## 2016-08-15 DIAGNOSIS — Z8249 Family history of ischemic heart disease and other diseases of the circulatory system: Secondary | ICD-10-CM

## 2016-08-15 DIAGNOSIS — E1121 Type 2 diabetes mellitus with diabetic nephropathy: Secondary | ICD-10-CM | POA: Diagnosis present

## 2016-08-15 DIAGNOSIS — I2129 ST elevation (STEMI) myocardial infarction involving other sites: Principal | ICD-10-CM | POA: Diagnosis present

## 2016-08-15 DIAGNOSIS — N170 Acute kidney failure with tubular necrosis: Secondary | ICD-10-CM | POA: Diagnosis not present

## 2016-08-15 DIAGNOSIS — I9581 Postprocedural hypotension: Secondary | ICD-10-CM | POA: Diagnosis not present

## 2016-08-15 DIAGNOSIS — Z833 Family history of diabetes mellitus: Secondary | ICD-10-CM

## 2016-08-15 DIAGNOSIS — T508X5A Adverse effect of diagnostic agents, initial encounter: Secondary | ICD-10-CM | POA: Diagnosis not present

## 2016-08-15 DIAGNOSIS — Z7982 Long term (current) use of aspirin: Secondary | ICD-10-CM

## 2016-08-15 DIAGNOSIS — N183 Chronic kidney disease, stage 3 (moderate): Secondary | ICD-10-CM | POA: Diagnosis present

## 2016-08-15 DIAGNOSIS — R34 Anuria and oliguria: Secondary | ICD-10-CM | POA: Diagnosis not present

## 2016-08-15 DIAGNOSIS — Y92009 Unspecified place in unspecified non-institutional (private) residence as the place of occurrence of the external cause: Secondary | ICD-10-CM

## 2016-08-15 DIAGNOSIS — E1165 Type 2 diabetes mellitus with hyperglycemia: Secondary | ICD-10-CM | POA: Diagnosis present

## 2016-08-15 DIAGNOSIS — Z7984 Long term (current) use of oral hypoglycemic drugs: Secondary | ICD-10-CM

## 2016-08-15 DIAGNOSIS — E872 Acidosis: Secondary | ICD-10-CM | POA: Diagnosis present

## 2016-08-15 DIAGNOSIS — E871 Hypo-osmolality and hyponatremia: Secondary | ICD-10-CM | POA: Diagnosis present

## 2016-08-15 DIAGNOSIS — R570 Cardiogenic shock: Secondary | ICD-10-CM | POA: Diagnosis present

## 2016-08-15 DIAGNOSIS — Z452 Encounter for adjustment and management of vascular access device: Secondary | ICD-10-CM

## 2016-08-15 DIAGNOSIS — I2119 ST elevation (STEMI) myocardial infarction involving other coronary artery of inferior wall: Secondary | ICD-10-CM | POA: Diagnosis not present

## 2016-08-15 DIAGNOSIS — E876 Hypokalemia: Secondary | ICD-10-CM | POA: Diagnosis present

## 2016-08-15 DIAGNOSIS — E860 Dehydration: Secondary | ICD-10-CM | POA: Diagnosis present

## 2016-08-15 DIAGNOSIS — E785 Hyperlipidemia, unspecified: Secondary | ICD-10-CM | POA: Diagnosis present

## 2016-08-15 DIAGNOSIS — I213 ST elevation (STEMI) myocardial infarction of unspecified site: Secondary | ICD-10-CM

## 2016-08-15 DIAGNOSIS — Z79899 Other long term (current) drug therapy: Secondary | ICD-10-CM

## 2016-08-15 DIAGNOSIS — I130 Hypertensive heart and chronic kidney disease with heart failure and stage 1 through stage 4 chronic kidney disease, or unspecified chronic kidney disease: Secondary | ICD-10-CM | POA: Diagnosis present

## 2016-08-15 DIAGNOSIS — R000 Tachycardia, unspecified: Secondary | ICD-10-CM | POA: Diagnosis present

## 2016-08-15 DIAGNOSIS — J9600 Acute respiratory failure, unspecified whether with hypoxia or hypercapnia: Secondary | ICD-10-CM

## 2016-08-15 DIAGNOSIS — I25110 Atherosclerotic heart disease of native coronary artery with unstable angina pectoris: Secondary | ICD-10-CM | POA: Diagnosis present

## 2016-08-15 DIAGNOSIS — E861 Hypovolemia: Secondary | ICD-10-CM | POA: Diagnosis not present

## 2016-08-15 DIAGNOSIS — I340 Nonrheumatic mitral (valve) insufficiency: Secondary | ICD-10-CM | POA: Diagnosis present

## 2016-08-15 DIAGNOSIS — J9601 Acute respiratory failure with hypoxia: Secondary | ICD-10-CM | POA: Diagnosis not present

## 2016-08-15 DIAGNOSIS — Z9111 Patient's noncompliance with dietary regimen: Secondary | ICD-10-CM

## 2016-08-15 HISTORY — DX: ST elevation (STEMI) myocardial infarction of unspecified site: I21.3

## 2016-08-15 HISTORY — PX: CARDIAC CATHETERIZATION: SHX172

## 2016-08-15 LAB — COMPREHENSIVE METABOLIC PANEL
ALK PHOS: 85 U/L (ref 38–126)
ALT: 27 U/L (ref 14–54)
AST: 105 U/L — AB (ref 15–41)
Albumin: 2.8 g/dL — ABNORMAL LOW (ref 3.5–5.0)
Anion gap: 12 (ref 5–15)
BILIRUBIN TOTAL: 1 mg/dL (ref 0.3–1.2)
BUN: 19 mg/dL (ref 6–20)
CALCIUM: 7.9 mg/dL — AB (ref 8.9–10.3)
CO2: 16 mmol/L — ABNORMAL LOW (ref 22–32)
CREATININE: 1.7 mg/dL — AB (ref 0.44–1.00)
Chloride: 106 mmol/L (ref 101–111)
GFR calc Af Amer: 37 mL/min — ABNORMAL LOW (ref >60)
GFR, EST NON AFRICAN AMERICAN: 32 mL/min — AB (ref >60)
Glucose, Bld: 386 mg/dL — ABNORMAL HIGH (ref 65–99)
POTASSIUM: 3 mmol/L — AB (ref 3.5–5.1)
Sodium: 134 mmol/L — ABNORMAL LOW (ref 135–145)
TOTAL PROTEIN: 7.3 g/dL (ref 6.5–8.1)

## 2016-08-15 LAB — HEPATIC FUNCTION PANEL
ALT: 28 U/L (ref 14–54)
AST: 98 U/L — AB (ref 15–41)
Albumin: 3.4 g/dL — ABNORMAL LOW (ref 3.5–5.0)
Alkaline Phosphatase: 101 U/L (ref 38–126)
BILIRUBIN DIRECT: 0.1 mg/dL (ref 0.1–0.5)
BILIRUBIN INDIRECT: 1.3 mg/dL — AB (ref 0.3–0.9)
BILIRUBIN TOTAL: 1.4 mg/dL — AB (ref 0.3–1.2)
Total Protein: 8.4 g/dL — ABNORMAL HIGH (ref 6.5–8.1)

## 2016-08-15 LAB — GLUCOSE, CAPILLARY
GLUCOSE-CAPILLARY: 500 mg/dL — AB (ref 65–99)
Glucose-Capillary: 222 mg/dL — ABNORMAL HIGH (ref 65–99)
Glucose-Capillary: 263 mg/dL — ABNORMAL HIGH (ref 65–99)
Glucose-Capillary: 286 mg/dL — ABNORMAL HIGH (ref 65–99)
Glucose-Capillary: 564 mg/dL (ref 65–99)

## 2016-08-15 LAB — BASIC METABOLIC PANEL
ANION GAP: 14 (ref 5–15)
BUN: 17 mg/dL (ref 6–20)
CALCIUM: 9 mg/dL (ref 8.9–10.3)
CO2: 19 mmol/L — AB (ref 22–32)
CREATININE: 1.63 mg/dL — AB (ref 0.44–1.00)
Chloride: 96 mmol/L — ABNORMAL LOW (ref 101–111)
GFR, EST AFRICAN AMERICAN: 39 mL/min — AB (ref >60)
GFR, EST NON AFRICAN AMERICAN: 33 mL/min — AB (ref >60)
Glucose, Bld: 583 mg/dL (ref 65–99)
Potassium: 3.9 mmol/L (ref 3.5–5.1)
SODIUM: 129 mmol/L — AB (ref 135–145)

## 2016-08-15 LAB — CBC
HEMATOCRIT: 44.5 % (ref 35.0–47.0)
Hemoglobin: 14.8 g/dL (ref 12.0–16.0)
MCH: 28.6 pg (ref 26.0–34.0)
MCHC: 33.3 g/dL (ref 32.0–36.0)
MCV: 85.9 fL (ref 80.0–100.0)
PLATELETS: 359 10*3/uL (ref 150–440)
RBC: 5.18 MIL/uL (ref 3.80–5.20)
RDW: 13.6 % (ref 11.5–14.5)
WBC: 16.3 10*3/uL — AB (ref 3.6–11.0)

## 2016-08-15 LAB — BLOOD GAS, ARTERIAL
Acid-base deficit: 12.6 mmol/L — ABNORMAL HIGH (ref 0.0–2.0)
Bicarbonate: 13 mmol/L — ABNORMAL LOW (ref 20.0–28.0)
FIO2: 1
O2 SAT: 68.6 %
PCO2 ART: 29 mmHg — AB (ref 32.0–48.0)
PO2 ART: 42 mmHg — AB (ref 83.0–108.0)
Patient temperature: 37
pH, Arterial: 7.26 — ABNORMAL LOW (ref 7.350–7.450)

## 2016-08-15 LAB — PROTIME-INR
INR: 1.06
PROTHROMBIN TIME: 13.8 s (ref 11.4–15.2)

## 2016-08-15 LAB — LIPASE, BLOOD: LIPASE: 29 U/L (ref 11–51)

## 2016-08-15 LAB — TROPONIN I: TROPONIN I: 19.28 ng/mL — AB (ref <0.03)

## 2016-08-15 LAB — BRAIN NATRIURETIC PEPTIDE: B NATRIURETIC PEPTIDE 5: 2436 pg/mL — AB (ref 0.0–100.0)

## 2016-08-15 LAB — APTT: APTT: 28 s (ref 24–36)

## 2016-08-15 LAB — CARDIAC CATHETERIZATION: CATHEFQUANT: 60 %

## 2016-08-15 SURGERY — LEFT HEART CATH AND CORONARY ANGIOGRAPHY
Anesthesia: Moderate Sedation

## 2016-08-15 SURGERY — LEFT HEART CATH AND CORONARY ANGIOGRAPHY

## 2016-08-15 MED ORDER — SODIUM CHLORIDE 0.9 % WEIGHT BASED INFUSION: 1.0000 mL/kg/h | INTRAVENOUS | Status: DC

## 2016-08-15 MED ORDER — LORAZEPAM 2 MG/ML IJ SOLN
0.5000 mg | Freq: Four times a day (QID) | INTRAMUSCULAR | Status: DC | PRN
  Administered 2016-08-15 – 2016-08-18 (×3): 1 mg via INTRAVENOUS
  Filled 2016-08-15 (×3): qty 1

## 2016-08-15 MED ORDER — INSULIN REGULAR BOLUS VIA INFUSION
INTRAVENOUS | Status: DC | PRN
  Administered 2016-08-15: 6 [IU] via INTRAVENOUS

## 2016-08-15 MED ORDER — INSULIN ASPART 100 UNIT/ML ~~LOC~~ SOLN
20.0000 [IU] | Freq: Once | SUBCUTANEOUS | Status: AC
  Administered 2016-08-15: 20 [IU] via SUBCUTANEOUS
  Filled 2016-08-15: qty 20

## 2016-08-15 MED ORDER — SODIUM CHLORIDE 0.9% FLUSH: 3.0000 mL | Freq: Two times a day (BID) | INTRAVENOUS | Status: DC

## 2016-08-15 MED ORDER — SODIUM CHLORIDE 0.9 % IV SOLN: 0.0000 ug/min | INTRAVENOUS | Status: DC

## 2016-08-15 MED ORDER — SODIUM CHLORIDE 0.9 % IV SOLN
INTRAVENOUS | Status: DC
  Administered 2016-08-15: 14:00:00 via INTRAVENOUS

## 2016-08-15 MED ORDER — FUROSEMIDE 10 MG/ML IJ SOLN
20.0000 mg | Freq: Once | INTRAMUSCULAR | Status: AC
  Administered 2016-08-15: 20 mg via INTRAVENOUS

## 2016-08-15 MED ORDER — NOREPINEPHRINE 4 MG/250ML-% IV SOLN
0.0000 ug/min | INTRAVENOUS | Status: DC
  Administered 2016-08-15: 2 ug/min via INTRAVENOUS
  Administered 2016-08-16 – 2016-08-17 (×2): 3 ug/min via INTRAVENOUS
  Administered 2016-08-17: 5 ug/min via INTRAVENOUS
  Filled 2016-08-15 (×4): qty 250

## 2016-08-15 MED ORDER — BIVALIRUDIN 250 MG IV SOLR
INTRAVENOUS | Status: AC
  Filled 2016-08-15: qty 250

## 2016-08-15 MED ORDER — TICAGRELOR 90 MG PO TABS
ORAL_TABLET | ORAL | Status: DC | PRN
  Administered 2016-08-15: 180 mg via ORAL

## 2016-08-15 MED ORDER — ONDANSETRON HCL 4 MG/2ML IJ SOLN
4.0000 mg | Freq: Four times a day (QID) | INTRAMUSCULAR | Status: DC | PRN
  Administered 2016-08-15 – 2016-08-16 (×2): 4 mg via INTRAVENOUS
  Filled 2016-08-15 (×2): qty 2

## 2016-08-15 MED ORDER — DOPAMINE-DEXTROSE 3.2-5 MG/ML-% IV SOLN
INTRAVENOUS | Status: DC | PRN
  Administered 2016-08-15: 5 ug/kg/min via INTRAVENOUS

## 2016-08-15 MED ORDER — SODIUM CHLORIDE 0.9% FLUSH
3.0000 mL | Freq: Two times a day (BID) | INTRAVENOUS | Status: DC
  Administered 2016-08-15 – 2016-08-18 (×6): 3 mL via INTRAVENOUS

## 2016-08-15 MED ORDER — ASPIRIN 81 MG PO CHEW
324.0000 mg | CHEWABLE_TABLET | Freq: Once | ORAL | Status: AC
  Administered 2016-08-15: 324 mg via ORAL
  Filled 2016-08-15: qty 4

## 2016-08-15 MED ORDER — ATORVASTATIN CALCIUM 20 MG PO TABS
40.0000 mg | ORAL_TABLET | Freq: Every day | ORAL | Status: DC
  Administered 2016-08-15 – 2016-08-18 (×4): 40 mg via ORAL
  Filled 2016-08-15 (×4): qty 2

## 2016-08-15 MED ORDER — IOPAMIDOL (ISOVUE-300) INJECTION 61%
INTRAVENOUS | Status: DC | PRN
Start: 2016-08-15 — End: 2016-08-15
  Administered 2016-08-15: 250 mL via INTRA_ARTERIAL

## 2016-08-15 MED ORDER — SODIUM CHLORIDE 0.9 % WEIGHT BASED INFUSION: 3.0000 mL/kg/h | INTRAVENOUS | Status: AC

## 2016-08-15 MED ORDER — SODIUM CHLORIDE 0.9 % IV SOLN
INTRAVENOUS | Status: DC
  Filled 2016-08-15 (×2): qty 2.5

## 2016-08-15 MED ORDER — TICAGRELOR 90 MG PO TABS
90.0000 mg | ORAL_TABLET | Freq: Two times a day (BID) | ORAL | Status: DC
  Administered 2016-08-15 – 2016-08-18 (×6): 90 mg via ORAL
  Filled 2016-08-15 (×6): qty 1

## 2016-08-15 MED ORDER — INSULIN ASPART 100 UNIT/ML ~~LOC~~ SOLN
0.0000 [IU] | Freq: Three times a day (TID) | SUBCUTANEOUS | Status: DC
  Administered 2016-08-16: 5 [IU] via SUBCUTANEOUS
  Administered 2016-08-16: 2 [IU] via SUBCUTANEOUS
  Administered 2016-08-16: 3 [IU] via SUBCUTANEOUS
  Administered 2016-08-17: 8 [IU] via SUBCUTANEOUS
  Filled 2016-08-15: qty 8
  Filled 2016-08-15: qty 3
  Filled 2016-08-15: qty 2
  Filled 2016-08-15: qty 5

## 2016-08-15 MED ORDER — SODIUM CHLORIDE 0.9% FLUSH: 3.0000 mL | INTRAVENOUS | Status: DC | PRN

## 2016-08-15 MED ORDER — FAMOTIDINE 20 MG PO TABS
20.0000 mg | ORAL_TABLET | Freq: Every day | ORAL | Status: DC
  Administered 2016-08-15 – 2016-08-16 (×2): 20 mg via ORAL
  Filled 2016-08-15 (×2): qty 1

## 2016-08-15 MED ORDER — INSULIN ASPART 100 UNIT/ML ~~LOC~~ SOLN
0.0000 [IU] | Freq: Every day | SUBCUTANEOUS | Status: DC
  Administered 2016-08-15: 3 [IU] via SUBCUTANEOUS
  Filled 2016-08-15: qty 3

## 2016-08-15 MED ORDER — INSULIN ASPART 100 UNIT/ML ~~LOC~~ SOLN
2.0000 [IU] | SUBCUTANEOUS | Status: DC
  Administered 2016-08-15: 6 [IU] via SUBCUTANEOUS
  Filled 2016-08-15: qty 6

## 2016-08-15 MED ORDER — HEPARIN (PORCINE) IN NACL 2-0.9 UNIT/ML-% IJ SOLN
INTRAMUSCULAR | Status: AC
Start: 2016-08-15 — End: 2016-08-15
  Filled 2016-08-15: qty 500

## 2016-08-15 MED ORDER — ASPIRIN 81 MG PO CHEW
81.0000 mg | CHEWABLE_TABLET | Freq: Every day | ORAL | Status: DC
  Administered 2016-08-15 – 2016-08-18 (×4): 81 mg via ORAL
  Filled 2016-08-15 (×4): qty 1

## 2016-08-15 MED ORDER — FENTANYL CITRATE (PF) 100 MCG/2ML IJ SOLN
INTRAMUSCULAR | Status: AC
  Filled 2016-08-15: qty 2

## 2016-08-15 MED ORDER — BIVALIRUDIN BOLUS VIA INFUSION - CUPID
INTRAVENOUS | Status: DC | PRN
  Administered 2016-08-15: 50.7 mg via INTRAVENOUS

## 2016-08-15 MED ORDER — SODIUM CHLORIDE 0.9 % IV SOLN
0.2500 mg/kg/h | INTRAVENOUS | Status: DC
  Administered 2016-08-15: 0.25 mg/kg/h via INTRAVENOUS
  Filled 2016-08-15: qty 250

## 2016-08-15 MED ORDER — INSULIN ASPART 100 UNIT/ML ~~LOC~~ SOLN
SUBCUTANEOUS | Status: AC
  Filled 2016-08-15: qty 6

## 2016-08-15 MED ORDER — MIDAZOLAM HCL 2 MG/2ML IJ SOLN
INTRAMUSCULAR | Status: AC
  Filled 2016-08-15: qty 2

## 2016-08-15 MED ORDER — ONDANSETRON HCL 4 MG/2ML IJ SOLN
INTRAMUSCULAR | Status: AC
  Filled 2016-08-15: qty 2

## 2016-08-15 MED ORDER — SODIUM CHLORIDE 0.9 % IV SOLN
INTRAVENOUS | Status: DC | PRN
  Administered 2016-08-15: 1.75 mg/kg/h via INTRAVENOUS

## 2016-08-15 MED ORDER — FUROSEMIDE 10 MG/ML IJ SOLN
INTRAMUSCULAR | Status: AC
  Filled 2016-08-15: qty 2

## 2016-08-15 MED ORDER — SODIUM CHLORIDE 0.9 % WEIGHT BASED INFUSION: 3.0000 mL/kg/h | INTRAVENOUS | Status: DC

## 2016-08-15 MED ORDER — POTASSIUM CHLORIDE 10 MEQ/50ML IV SOLN
10.0000 meq | INTRAVENOUS | Status: AC
  Administered 2016-08-15: 10 meq via INTRAVENOUS
  Filled 2016-08-15 (×2): qty 50

## 2016-08-15 MED ORDER — TICAGRELOR 90 MG PO TABS
ORAL_TABLET | ORAL | Status: AC
  Filled 2016-08-15: qty 2

## 2016-08-15 MED ORDER — ONDANSETRON HCL 4 MG/2ML IJ SOLN
INTRAMUSCULAR | Status: DC | PRN
  Administered 2016-08-15: 4 mg via INTRAVENOUS

## 2016-08-15 MED ORDER — NITROGLYCERIN 5 MG/ML IV SOLN
INTRAVENOUS | Status: AC
  Filled 2016-08-15: qty 10

## 2016-08-15 MED ORDER — DOPAMINE-DEXTROSE 3.2-5 MG/ML-% IV SOLN
INTRAVENOUS | Status: AC
  Filled 2016-08-15: qty 250

## 2016-08-15 MED ORDER — ACETAMINOPHEN 325 MG PO TABS: 650.0000 mg | ORAL_TABLET | ORAL | Status: DC | PRN

## 2016-08-15 MED ORDER — ASPIRIN 81 MG PO CHEW: 81.0000 mg | CHEWABLE_TABLET | ORAL | Status: DC

## 2016-08-15 MED ORDER — SODIUM CHLORIDE 0.9 % IV SOLN: 250.0000 mL | INTRAVENOUS | Status: DC | PRN

## 2016-08-15 MED ORDER — SODIUM CHLORIDE 0.9 % IV SOLN
INTRAVENOUS | Status: DC | PRN
  Administered 2016-08-15: 1000 mL via INTRAVENOUS

## 2016-08-15 SURGICAL SUPPLY — 15 items
BALLN TREK RX 2.25X12 (BALLOONS) ×3
BALLOON TREK RX 2.25X12 (BALLOONS) ×1 IMPLANT
CATH 5FR JR4 DIAGNOSTIC (CATHETERS) ×3 IMPLANT
CATH 5FR PIGTAIL DIAGNOSTIC (CATHETERS) ×2 IMPLANT
CATH INFINITI 5FR JL4 (CATHETERS) ×3 IMPLANT
CATH VISTA GUIDE 6FR XB3.5 SH (CATHETERS) ×3 IMPLANT
DEVICE CLOSURE MYNXGRIP 6/7F (Vascular Products) ×3 IMPLANT
DEVICE INFLAT 30 PLUS (MISCELLANEOUS) ×3 IMPLANT
KIT MANI 3VAL PERCEP (MISCELLANEOUS) ×3 IMPLANT
NEEDLE PERC 18GX7CM (NEEDLE) ×3 IMPLANT
PACK CARDIAC CATH (CUSTOM PROCEDURE TRAY) ×3 IMPLANT
SHEATH AVANTI 6FR X 11CM (SHEATH) ×3 IMPLANT
STENT XIENCE ALPINE RX 2.25X15 (Permanent Stent) ×3 IMPLANT
WIRE EMERALD 3MM-J .035X150CM (WIRE) ×3 IMPLANT
WIRE G HI TQ BMW 190 (WIRE) ×3 IMPLANT

## 2016-08-15 NOTE — Progress Notes (Signed)
Pt lying in bed with bipap in place pt calm no resp. distress noted. Rt IJ placed by PICC team. Pt tolerated well. CXRAY done awaiting results. Marland Kitchen

## 2016-08-15 NOTE — ED Triage Notes (Addendum)
C/o 2-3 day history of chest pain intermittently.   Currently pain is to left chest radiating to left arm.  AT times patient states pain has been in neck and back.  Also c/o decreased appetitite x 1 day.

## 2016-08-15 NOTE — Consult Note (Signed)
[place] [facility] [place] NAME: Deborah [name]    MR#:  [id number]  DATE OF BIRTH:  1956/08/12  DATE OF ADMISSION:  08/15/2016  PRIMARY CARE PHYSICIAN: Rica Mast, MD   REQUESTING/REFERRING PHYSICIAN: Dr Clayborn Bigness  CHIEF COMPLAINT:   Chief Complaint  Patient presents with  . Chest Pain    HISTORY OF PRESENT ILLNESS:  Deborah [name]  is a 60 y.o. female with a known history of Type 2 diabetes noncompliant medication, hypertension, hyperlipidemia comes to the emergency room with chest heaviness 5-6 out of 10 mid substernal along with diaphoresis and left arm. Patient's EKG suggested acute MI and cardiology was consulted patient went to the Cath Lab and had a stent put in in a small OM branch of circumflex. Her troponin was 20. Internal medicine was consulted to help manage type 2 diabetes with uncontrolled sugars. Patient denies any chest pain shortness of breath at present. It seems like She has been noncompliant with meds. Patient's current blood sugar is 568. She is getting 20 of regular insulin subcutaneous. She currently is on IV dopamine which has been weaned off and being started on levo fed due to her hypotension.  PAST MEDICAL HISTORY:   Past Medical History:  Diagnosis Date  . Diabetes mellitus   . Hyperlipidemia   . Hypertension     PAST SURGICAL HISTOIRY:   Past Surgical History:  Procedure Laterality Date  . VAGINAL DELIVERY     x 6    SOCIAL HISTORY:   Social History  Substance Use Topics  . Smoking status: Never Smoker  . Smokeless tobacco: Never Used  . Alcohol use No    FAMILY HISTORY:   Family History  Problem Relation Age of Onset  . Hypertension Mother   . Diabetes Mother   . Breast cancer Sister     DRUG ALLERGIES:  No Known Allergies  REVIEW OF SYSTEMS:   Review of Systems  Constitutional: Negative for chills, fever and weight loss.  HENT: Negative for ear discharge, ear pain and  nosebleeds.   Eyes: Negative for blurred vision, pain and discharge.  Respiratory: Negative for sputum production, shortness of breath, wheezing and stridor.   Cardiovascular: Negative for chest pain, palpitations, orthopnea and PND.  Gastrointestinal: Negative for abdominal pain, diarrhea, nausea and vomiting.  Genitourinary: Negative for frequency and urgency.  Musculoskeletal: Negative for back pain and joint pain.  Neurological: Positive for weakness. Negative for sensory change, speech change and focal weakness.  Psychiatric/Behavioral: Negative for depression and hallucinations. The patient is not nervous/anxious.    MEDICATIONS AT HOME:   Prior to Admission medications   Medication Sig Start Date End Date Taking? Authorizing Provider  amLODipine (NORVASC) 10 MG tablet Take 1 tablet (10 mg total) by mouth daily. 03/20/16  Yes Coral Spikes, DO  atorvastatin (LIPITOR) 20 MG tablet Take 1 tablet (20 mg total) by mouth daily. 07/10/14  Yes Jackolyn Confer, MD  glipiZIDE (GLUCOTROL) 10 MG tablet TAKE 1 TABLET TWICE A DAYBEFORE A MEAL Patient taking differently: TAKE 1 TABLET TWICE A DAY BEFORE A MEAL 04/20/15  Yes Jackolyn Confer, MD  metFORMIN (GLUCOPHAGE) 500 MG tablet Take 1 tablet (500 mg total) by mouth 2 (two) times daily. NEED APPT ASAP FOR FURTHER REFILLS. CALL OFFICE SOON 05/18/16  Yes Jackolyn Confer, MD  metoprolol (LOPRESSOR) 100 MG tablet Take 1 tablet (100 mg total) by mouth 2 (two) times daily. 05/17/16  Yes Jackolyn Confer,  MD  ranitidine (ZANTAC) 150 MG tablet TAKE ONE TABLET BY MOUTH TWICE DAILY 05/17/15  Yes Jackolyn Confer, MD  valsartan-hydrochlorothiazide (DIOVAN-HCT) 320-25 MG tablet Take 1 tablet by mouth daily. 03/20/16  Yes Coral Spikes, DO  aspirin EC 81 MG tablet Take 81 mg by mouth every other day.    Historical Provider, MD      VITAL SIGNS:  Blood pressure (!) 80/63, pulse 89, temperature 98.8 F (37.1 C), temperature source Oral, resp. rate 19, height 5'  1" (1.549 m), weight 65.4 kg (144 lb 2.9 oz), SpO2 91 %.  PHYSICAL EXAMINATION:  GENERAL:  60 y.o.-year-old patient lying in the bed with no acute distress.  EYES: Pupils equal, round, reactive to light and accommodation. No scleral icterus. Extraocular muscles intact.  HEENT: Head atraumatic, normocephalic. Oropharynx and nasopharynx clear.  NECK:  Supple, no jugular venous distention. No thyroid enlargement, no tenderness.  LUNGS: Normal breath sounds bilaterally, no wheezing, rales,rhonchi or crepitation. No use of accessory muscles of respiration.  CARDIOVASCULAR: S1, S2 normal. No murmurs, rubs, or gallops.  ABDOMEN: Soft, nontender, nondistended. Bowel sounds present. No organomegaly or mass.  EXTREMITIES: No pedal edema, cyanosis, or clubbing.  NEUROLOGIC: Cranial nerves II through XII are intact. Muscle strength 5/5 in all extremities. Sensation intact. Gait not checked.  PSYCHIATRIC: The patient is alert and oriented x 3.  SKIN: No obvious rash, lesion, or ulcer.   LABORATORY PANEL:   CBC  Recent Labs Lab 08/15/16 1338  WBC 16.3*  HGB 14.8  HCT 44.5  PLT 359   ------------------------------------------------------------------------------------------------------------------  Chemistries   Recent Labs Lab 08/15/16 1338  NA 129*  K 3.9  CL 96*  CO2 19*  GLUCOSE 583*  BUN 17  CREATININE 1.63*  CALCIUM 9.0  AST 98*  ALT 28  ALKPHOS 101  BILITOT 1.4*   ------------------------------------------------------------------------------------------------------------------  Cardiac Enzymes  Recent Labs Lab 08/15/16 1338  TROPONINI 19.28*   IMPRESSION AND PLAN:   Deborah [name]  is a 60 y.o. female with a known history of Type 2 diabetes noncompliant medication, hypertension, hyperlipidemia comes to the emergency room with chest heaviness 5-6 out of 10 mid substernal along with diaphoresis and left arm. Patient's EKG suggested acute MI and cardiology was consulted  patient went to the Cath Lab and had a stent put in in a small OM branch of circumflex. Her troponin was 20. Internal medicine was consulted to help manage type 2 diabetes with uncontrolled sugars.  1. Uncontrolled type 2 diabetes -Given sugars in the 500s will start patient on insulin drip with hypoglycemia ICU protocol -Hold off metformin since patient is post cardiac cath -Resume glipizide tomorrow once diet is advanced. Patient may need to be on Lantus/Levemir depending on her sugars at discharge -Dietitian to see patient -There seems to be some issue with noncompliance with meds and diet  2. Acute ST EMI status post stent placement -Patient currently on brilinta and aspirin -Unable to give beta blockers or ace inhibitors due to hypotension -We'll initiate once patient is off vasopressors  3. Acute hypotension post MI -Patient is on IV fluids received bolus 2 L earlier -Continue IV levofed per cardiology recommendation -Her EF according to cardiology is normal  4. Hyperlipidemia continue statins  5. DVT prophylaxis Patient already on aspirin and Brilinta. SCD teds  Patient has very poor IV access. She has vasopressors running in 1 small IV peripheral. Discussed with cardiology and need for IV access for IV fluids, IV vasopressor and insulin drip.  We'll get EJ or IJ tonight.   All the records are reviewed and case discussed with Consulting provider. Management plans discussed with the patient, family and they are in agreement.  CODE STATUS: Full code  TOTAL TIME TAKING CARE OF THIS PATIENT: 50 minutes.    Dontez Hauss M.D on 08/15/2016 at 5:12 PM  Between 7am to 6pm - Pager - [phone]  After 6pm go to [email or web address] - password [email or web address] [facility]  [facility] [facility]  Office  [phone]  CC: Primary care Physician: Rica Mast, MD

## 2016-08-15 NOTE — Progress Notes (Signed)
Pt arrived to ICU alert and oriented x4 with no c/o pain. Lung sounds clear to auscultation- sating 90-95% on 4LNC. NSR on cardiac monitor.  Pt began to c/o SOB-sats 80-85%, RR 40. NRB applied-pt reported she felt better. Lung sounds are rhonchus to auscultation.  Dr Bridgett Larsson & Dr Clayborn Bigness notified of pt respiratory status. ABG ordered. Echo & CXR ordered. BMP & BNP ordered. Bi-pap PRN ordered.

## 2016-08-15 NOTE — ED Provider Notes (Signed)
[facility] Emergency Department Provider Note  ____________________________________________  Time seen: Approximately 1:40 PM  I have reviewed the triage vital signs and the nursing notes.   HISTORY  Chief Complaint Chest Pain  Level 5 caveat:  Portions of the history and physical were unable to be obtained due to the patient's acute illness   HPI Deborah [name] is a 60 y.o. female who complains of central chest pain aching radiating to the left arm, intermittently lasting several minutes at a time for the past 2 days. Never had pain like this before. Associated with shortness of breath. No diaphoresis or vomiting. No dizziness or syncope. Has a history of hypertension hyperlipidemia diabetes. Denies any history of heart attacks or CAD in the past.     Past Medical History:  Diagnosis Date  . Diabetes mellitus   . Hyperlipidemia   . Hypertension      Patient Active Problem List   Diagnosis Date Noted  . Noncompliance 01/25/2015  . Bereavement 07/09/2014  . Low back pain 02/17/2014  . Cervical cancer screening 11/20/2013  . Routine general medical examination at a health care facility 10/14/2013  . Chronic kidney disease, stage 3 06/14/2012  . Hypertension 03/08/2012  . Diabetes mellitus type 2, controlled 03/08/2012  . Hyperlipidemia 03/08/2012     Past Surgical History:  Procedure Laterality Date  . VAGINAL DELIVERY     x 6     Prior to Admission medications   Medication Sig Start Date End Date Taking? Authorizing Provider  amLODipine (NORVASC) 10 MG tablet Take 1 tablet (10 mg total) by mouth daily. 03/20/16  Yes Coral Spikes, DO  atorvastatin (LIPITOR) 20 MG tablet Take 1 tablet (20 mg total) by mouth daily. 07/10/14  Yes Jackolyn Confer, MD  glipiZIDE (GLUCOTROL) 10 MG tablet TAKE 1 TABLET TWICE A DAYBEFORE A MEAL Patient taking differently: TAKE 1 TABLET TWICE A DAY BEFORE A MEAL 04/20/15  Yes Jackolyn Confer, MD  metFORMIN  (GLUCOPHAGE) 500 MG tablet Take 1 tablet (500 mg total) by mouth 2 (two) times daily. NEED APPT ASAP FOR FURTHER REFILLS. CALL OFFICE SOON 05/18/16  Yes Jackolyn Confer, MD  metoprolol (LOPRESSOR) 100 MG tablet Take 1 tablet (100 mg total) by mouth 2 (two) times daily. 05/17/16  Yes Jackolyn Confer, MD  ranitidine (ZANTAC) 150 MG tablet TAKE ONE TABLET BY MOUTH TWICE DAILY 05/17/15  Yes Jackolyn Confer, MD  valsartan-hydrochlorothiazide (DIOVAN-HCT) 320-25 MG tablet Take 1 tablet by mouth daily. 03/20/16  Yes Coral Spikes, DO  aspirin EC 81 MG tablet Take 81 mg by mouth every other day.    Historical Provider, MD     Allergies Review of patient's allergies indicates no known allergies.   Family History  Problem Relation Age of Onset  . Hypertension Mother   . Diabetes Mother   . Breast cancer Sister     Social History Social History  Substance Use Topics  . Smoking status: Never Smoker  . Smokeless tobacco: Never Used  . Alcohol use No    Review of Systems  Constitutional:   No fever or chills.  ENT:   No sore throat. No rhinorrhea. Cardiovascular:   Positive as above chest pain. Respiratory:   Positive shortness of breath. Gastrointestinal:   Negative for abdominal pain, vomiting and diarrhea.  Genitourinary:   Negative for dysuria or difficulty urinating. Musculoskeletal:   Negative for focal pain or swelling Neurological:   Negative for headaches 10-point ROS  otherwise negative.  ____________________________________________   PHYSICAL EXAM:  VITAL SIGNS: ED Triage Vitals  Enc Vitals Group     BP 08/15/16 1334 99/69     Pulse Rate 08/15/16 1334 93     Resp 08/15/16 1334 18     Temp 08/15/16 1333 98.3 F (36.8 C)     Temp Source 08/15/16 1333 Oral     SpO2 --      Weight 08/15/16 1333 149 lb (67.6 kg)     Height 08/15/16 1333 5\' 1"  (1.549 m)     Head Circumference --      Peak Flow --      Pain Score 08/15/16 1335 5     Pain Loc --      Pain Edu? --       Excl. in [place]? --     Vital signs reviewed, nursing assessments reviewed.   Constitutional:   Alert and oriented. Well appearing and in no distress. Eyes:   No scleral icterus. No conjunctival pallor. PERRL. EOMI.  No nystagmus. ENT   Head:   Normocephalic and atraumatic.   Nose:   No congestion/rhinnorhea. No septal hematoma   Mouth/Throat:   MMM, no pharyngeal erythema. No peritonsillar mass.    Neck:   No stridor. No SubQ emphysema. No meningismus. Hematological/Lymphatic/Immunilogical:   No cervical lymphadenopathy. Cardiovascular:   RRR. Symmetric bilateral radial and DP pulses.  No murmurs.  Respiratory:   Normal respiratory effort without tachypnea nor retractions. Breath sounds are clear and equal bilaterally. No wheezes/rales/rhonchi. Gastrointestinal:   Soft and nontender. Non distended. There is no CVA tenderness.  No rebound, rigidity, or guarding. Genitourinary:   deferred Musculoskeletal:   Nontender with normal range of motion in all extremities. No joint effusions.  No lower extremity tenderness.  No edema. Neurologic:   Normal speech and language.  CN 2-10 normal. Motor grossly intact. No gross focal neurologic deficits are appreciated.  Skin:    Skin is warm, dry and intact. No rash noted.  No petechiae, purpura, or bullae.  ____________________________________________    LABS (pertinent positives/negatives) (all labs ordered are listed, but only abnormal results are displayed) Labs Reviewed  CBC - Abnormal; Notable for the following:       Result Value   WBC 16.3 (*)    All other components within normal limits  BASIC METABOLIC PANEL - Abnormal; Notable for the following:    Sodium 129 (*)    Chloride 96 (*)    CO2 19 (*)    Glucose, Bld 583 (*)    Creatinine, Ser 1.63 (*)    GFR calc non Af Amer 33 (*)    GFR calc Af Amer 39 (*)    All other components within normal limits  TROPONIN I - Abnormal; Notable for the following:    Troponin I  19.28 (*)    All other components within normal limits  HEPATIC FUNCTION PANEL - Abnormal; Notable for the following:    Total Protein 8.4 (*)    Albumin 3.4 (*)    AST 98 (*)    Total Bilirubin 1.4 (*)    Indirect Bilirubin 1.3 (*)    All other components within normal limits  APTT  PROTIME-INR  LIPASE, BLOOD   ____________________________________________   EKG  Interpreted by me Initial EKG at 1328 shows sinus rhythm rate of 86, left axis, normal intervals. Normal QRS. ST elevation 1-2 mm in aVL, ST depression in 3 and aVF with upright T waves.  Repeat  EKG at 1336 not significantly changed from initial. Sinus rhythm, rate of 87. Persistent ST elevation in aVL.    ____________________________________________    RADIOLOGY    ____________________________________________   PROCEDURES Procedures CRITICAL CARE Performed by: Joni Fears, Kaydi Kley   Total critical care time: 40 minutes  Critical care time was exclusive of separately billable procedures and treating other patients.  Critical care was necessary to treat or prevent imminent or life-threatening deterioration.  Critical care was time spent personally by me on the following activities: development of treatment plan with patient and/or surrogate as well as nursing, discussions with consultants, evaluation of patient's response to treatment, examination of patient, obtaining history from patient or surrogate, ordering and performing treatments and interventions, ordering and review of laboratory studies, ordering and review of radiographic studies, pulse oximetry and re-evaluation of patient's condition.  ____________________________________________   INITIAL IMPRESSION / ASSESSMENT AND PLAN / ED COURSE  Pertinent labs & imaging results that were available during my care of the patient were reviewed by me and considered in my medical decision making (see chart for details).  Patient evaluated at 1:31 PM. Code  STEMI activated at 1:35 PM. Discussed with Dr. Clayborn Bigness at 1:42 PM will evaluate in the ED.     Clinical Course  Comment By Time  D/w Dr. Clayborn Bigness, suspects high lat STEMI in I,aVL. Plan for emergent cath, but there is already a patient in the cath lab.  Will proceed as soon as possible.  Does not feel that any additional meds are necessary at this time.   Carrie Mew, MD 09/12 1406   ____________________________________________   FINAL CLINICAL IMPRESSION(S) / ED DIAGNOSES  Final diagnoses:  ST elevation myocardial infarction (STEMI), unspecified artery [facility])       Portions of this note were generated with dragon dictation software. Dictation errors may occur despite best attempts at proofreading.    Carrie Mew, MD 08/15/16 1450

## 2016-08-15 NOTE — Consult Note (Signed)
Reason for Consult: STEMI lateral wall Referring Physician: Emergency room doctor Deborah [name] is an 60 y.o. female.  HPI: Patient presents with a three-day history of midsternal chest heaviness 5-6 out of 10. Patient states symptoms are intermittent and recurrent with diaphoresis chest pressure radiating to her left arm. Patient complaining of generalized weakness and fatigue. The pain was off and on but this morning when she got up the pain was relatively persistent at about 6/10 so she called her family who then recommended she come to the emergency room. In the ER EKG suggested acute myocardial infarction cardiology standpoint consulted for possible management and intervention. Patient has history of diabetes hypertension hyperlipidemia but is concerned about noncompliance. Patient does not smoke does not remember her last blood sugar reading denies any fever chills no known peripheral vascular disease no recent surgery. Patient has been followed by Ronette Deter who is no longer here and is switching to a new provider. Denies any previous cardiac history  Past Medical History:  Diagnosis Date  . Diabetes mellitus   . Hyperlipidemia   . Hypertension     Past Surgical History:  Procedure Laterality Date  . VAGINAL DELIVERY     x 6    Family History  Problem Relation Age of Onset  . Hypertension Mother   . Diabetes Mother   . Breast cancer Sister     Social History:  reports that she has never smoked. She has never used smokeless tobacco. She reports that she does not drink alcohol or use drugs.  Allergies: No Known Allergies  Medications: I have reviewed the patient's current medications.  Results for orders placed or performed during the hospital encounter of 08/15/16 (from the past 48 hour(s))  APTT     Status: None   Collection Time: 08/15/16  1:38 PM  Result Value Ref Range   aPTT 28 24 - 36 seconds  CBC     Status: Abnormal   Collection Time: 08/15/16  1:38 PM   Result Value Ref Range   WBC 16.3 (H) 3.6 - 11.0 K/uL   RBC 5.18 3.80 - 5.20 MIL/uL   Hemoglobin 14.8 12.0 - 16.0 g/dL   HCT 44.5 35.0 - 47.0 %   MCV 85.9 80.0 - 100.0 fL   MCH 28.6 26.0 - 34.0 pg   MCHC 33.3 32.0 - 36.0 g/dL   RDW 13.6 11.5 - 14.5 %   Platelets 359 150 - 440 K/uL  Basic metabolic panel     Status: Abnormal   Collection Time: 08/15/16  1:38 PM  Result Value Ref Range   Sodium 129 (L) 135 - 145 mmol/L   Potassium 3.9 3.5 - 5.1 mmol/L   Chloride 96 (L) 101 - 111 mmol/L   CO2 19 (L) 22 - 32 mmol/L   Glucose, Bld 583 (HH) 65 - 99 mg/dL    Comment: CRITICAL RESULT CALLED TO, READ BACK BY AND VERIFIED WITH AMY TEAGUE ON 08/15/16 AT 1432 BY KLW    BUN 17 6 - 20 mg/dL   Creatinine, Ser 1.63 (H) 0.44 - 1.00 mg/dL   Calcium 9.0 8.9 - 10.3 mg/dL   GFR calc non Af Amer 33 (L) >60 mL/min   GFR calc Af Amer 39 (L) >60 mL/min    Comment: (NOTE) The eGFR has been calculated using the CKD EPI equation. This calculation has not been validated in all clinical situations. eGFR's persistently <60 mL/min signify possible Chronic Kidney Disease.    Anion gap  14 5 - 15  Protime-INR     Status: None   Collection Time: 08/15/16  1:38 PM  Result Value Ref Range   Prothrombin Time 13.8 11.4 - 15.2 seconds   INR 1.06   Troponin I     Status: Abnormal   Collection Time: 08/15/16  1:38 PM  Result Value Ref Range   Troponin I 19.28 (HH) <0.03 ng/mL    Comment: CRITICAL RESULT CALLED TO, READ BACK BY AND VERIFIED WITH AMY TEAGUE ON 08/15/16 AT 1432 BY KLW   Hepatic function panel     Status: Abnormal   Collection Time: 08/15/16  1:38 PM  Result Value Ref Range   Total Protein 8.4 (H) 6.5 - 8.1 g/dL   Albumin 3.4 (L) 3.5 - 5.0 g/dL   AST 98 (H) 15 - 41 U/L   ALT 28 14 - 54 U/L   Alkaline Phosphatase 101 38 - 126 U/L   Total Bilirubin 1.4 (H) 0.3 - 1.2 mg/dL   Bilirubin, Direct 0.1 0.1 - 0.5 mg/dL   Indirect Bilirubin 1.3 (H) 0.3 - 0.9 mg/dL  Lipase, blood     Status: None    Collection Time: 08/15/16  1:38 PM  Result Value Ref Range   Lipase 29 11 - 51 U/L  Glucose, capillary     Status: Abnormal   Collection Time: 08/15/16  3:30 PM  Result Value Ref Range   Glucose-Capillary 500 (H) 65 - 99 mg/dL  Glucose, capillary     Status: Abnormal   Collection Time: 08/15/16  4:15 PM  Result Value Ref Range   Glucose-Capillary 564 (HH) 65 - 99 mg/dL   Comment 1 Notify RN     No results found.  Review of Systems  Constitutional: Positive for diaphoresis and malaise/fatigue.  HENT: Positive for congestion.   Eyes: Negative.   Respiratory: Positive for shortness of breath.   Cardiovascular: Positive for chest pain, orthopnea and PND.  Gastrointestinal: Negative.   Genitourinary: Negative.   Musculoskeletal: Positive for myalgias.  Skin: Negative.   Neurological: Positive for weakness.  Endo/Heme/Allergies: Negative.   Psychiatric/Behavioral: Negative.    Blood pressure 103/75, pulse 90, temperature 98.3 F (36.8 C), temperature source Oral, resp. rate (!) 22, height 5' 1"  (1.549 m), weight 67.6 kg (149 lb), SpO2 99 %. Physical Exam  Nursing note and vitals reviewed. Constitutional: She is oriented to person, place, and time. She appears well-developed and well-nourished.  HENT:  Head: Normocephalic and atraumatic.  Eyes: Conjunctivae and EOM are normal. Pupils are equal, round, and reactive to light.  Neck: Normal range of motion. Neck supple.  Cardiovascular: Normal rate and regular rhythm.  Exam reveals gallop.   Murmur heard. Respiratory: Effort normal and breath sounds normal.  GI: Soft. Bowel sounds are normal.  Musculoskeletal: Normal range of motion.  Neurological: She is alert and oriented to person, place, and time. She has normal reflexes.  Skin: Skin is warm and dry.  Psychiatric: She has a normal mood and affect.    Assessment/Plan: STEMI high lateral late presenting greater than 24 hours since the beginning of symptoms Unstable  angina Diabetes type 2 with hyperglycemia Hypotension Dehydration Renal insufficiency stage III History of hypertension Hyperlipidemia Elevated troponin Abnormal EKG Elevated white count Noncompliance . PLAN Admit with intention to treat Proceed directly to cardiac catheter and PCI and stent Diabetes management control with insulin fluid hydration Consult internal medicine hospitalist for management of medical problems Continue fluid resuscitation for hypotension Low-dose pressors Levophed and dopamine  to maintain blood pressure 100 Procedure on statin therapy for hyperlipidemia Consider nephrology evaluation for renal sufficiency Aspirin Brilinta for post-PCI stent placement Consider beta-blockade therapy post-MI Hopefully we'll be able to start low-dose ace with renal insufficiency post MI Elevated troponin suggests late presentation for an acute myocardial infarction Transfer the patient to the ICU for convalescence   Addelynn Batte D. 08/15/2016, 4:31 PM

## 2016-08-15 NOTE — Progress Notes (Signed)
Inpatient Diabetes Program Recommendations  AACE/ADA: New Consensus Statement on Inpatient Glycemic Control (2015)  Target Ranges:  Prepandial:   less than 140 mg/dL      Peak postprandial:   less than 180 mg/dL (1-2 hours)      Critically ill patients:  140 - 180 mg/dL   Results for ANNAKATE, SOULIER (MRN [id number]) as of 08/15/2016 15:33  Ref. Range 08/15/2016 13:38  Glucose Latest Ref Range: 65 - 99 mg/dL [facility])  Results for EMMARIE, SANNES (MRN [id number]) as of 08/15/2016 15:33  Ref. Range 07/09/2014 13:54  Hemoglobin A1C Latest Ref Range: 4.6 - 6.5 % 7.6 (H)   Review of Glycemic Control  Diabetes history: DM2 Outpatient Diabetes medications: Glipizide 10 mg BID, Metformin 500 mg BID Current orders for Inpatient glycemic control: In Cath Lab at this time  Inpatient Diabetes Program Recommendations: Insulin - IV drip/GlucoStabilizer: Noted initial glucose of 583 mg/dl at 13:38 today. Patient received Novolog 6 units IV at 14:45 today. Finger stick glucose 500 mg/dl at 15:30. Please consider using IV insulin (per IV insulin/GlucoStabilizer order set if patient will be non-vented and eating post procedure or use ICU Glycemic Control order set Phase 2 if patient will be on ventilator and NPO post procedure) order set to bring glucose down safely.  A1C: Please consider ordering an A1C to evaluate glycemic control over the past 2-3 months.  NOTE: Chart reviewed. Patient is currently in Cath Lab. Noted initial glucose 583 mg/dl today and last A1C was over a year ago. Recommend using IV insulin to bring glucose down safely and utilizing the GlucoStabilizer will provide more information to use to determine actual insulin needs. Will continue to follow and will plan to talk with patient regarding diabetes control tomorrow on 08/16/16.  Thanks, Barnie Alderman, RN, MSN, CDE Diabetes Coordinator Inpatient Diabetes Program [phone] (Team Pager from [place] to [place]) [phone] ([place] office) [phone] [facility]  office) [phone] [facility] office)

## 2016-08-15 NOTE — ED Notes (Signed)
Elevated troponin and elevated glucose level acknowledged and critical provided to [facility] in cath lab   Family - son [name] escorted to specials waiting area

## 2016-08-15 NOTE — Consult Note (Signed)
PULMONARY / CRITICAL CARE MEDICINE   Name: Deborah [name] MRN: [id number] DOB: 09/10/1956    ADMISSION DATE:  08/15/2016 CONSULTATION DATE:  08/15/2016  REFERRING MD:  Dr. Clayborn Bigness   CHIEF COMPLAINT:  Shortness of breath, tachycardia  HISTORY OF PRESENT ILLNESS:   Deborah [name] is a 60 y.o. Female, with past medical history significant for Type 2 Diabetes Mellitus noncompliant with medications, hypertension, hyperlipidemia, who presented to the emergency room with complaints of chest heaviness and pressure 5-6 out of 10 along with shortness of breath.  Patient's EKG was concerning for acute MI, Troponin-19.28, BNP-2,436. Cardiology was consulted and patient went to Cath Lab and underwent stent placement in the small OM branch of [employer].  Status post stent placement, patient was noted to have shortness of breath, along with hypotension and tachycardia, CXR concerning for fluid overload related to CHF. Patient was diuresed with Lasix, and placed on Bipap, and transferred to ICU.  PCCM was consulted for further management.    PAST MEDICAL HISTORY :  She  has a past medical history of Diabetes mellitus; Hyperlipidemia; and Hypertension.  PAST SURGICAL HISTORY: She  has a past surgical history that includes Vaginal delivery.  No Known Allergies  No current facility-administered medications on file prior to encounter.    Current Outpatient Prescriptions on File Prior to Encounter  Medication Sig  . amLODipine (NORVASC) 10 MG tablet Take 1 tablet (10 mg total) by mouth daily.  Marland Kitchen atorvastatin (LIPITOR) 20 MG tablet Take 1 tablet (20 mg total) by mouth daily.  Marland Kitchen glipiZIDE (GLUCOTROL) 10 MG tablet TAKE 1 TABLET TWICE A DAYBEFORE A MEAL (Patient taking differently: TAKE 1 TABLET TWICE A DAY BEFORE A MEAL)  . metFORMIN (GLUCOPHAGE) 500 MG tablet Take 1 tablet (500 mg total) by mouth 2 (two) times daily. NEED APPT ASAP FOR FURTHER REFILLS. CALL OFFICE SOON  . metoprolol (LOPRESSOR) 100 MG tablet  Take 1 tablet (100 mg total) by mouth 2 (two) times daily.  . ranitidine (ZANTAC) 150 MG tablet TAKE ONE TABLET BY MOUTH TWICE DAILY  . valsartan-hydrochlorothiazide (DIOVAN-HCT) 320-25 MG tablet Take 1 tablet by mouth daily.    FAMILY HISTORY:  Her indicated that her mother is deceased. She indicated that the status of her sister is unknown. She indicated that her other is alive.    SOCIAL HISTORY: She  reports that she has never smoked. She has never used smokeless tobacco. She reports that she does not drink alcohol or use drugs.  REVIEW OF SYSTEMS:   Review of Systems  Constitutional: Negative for chills, diaphoresis, fever, malaise/fatigue and weight loss.  Respiratory: Positive for shortness of breath. Negative for cough, hemoptysis, sputum production and wheezing.   Cardiovascular: Negative for chest pain, palpitations and leg swelling.  Gastrointestinal: Negative for abdominal pain, nausea and vomiting.  Skin: Negative for itching and rash.  Neurological: Negative for dizziness.     SUBJECTIVE:  Patient denies chest pain or shortness of breath currently.  She states that "she is feeling better".  VITAL SIGNS: BP (!) 84/68   Pulse 96   Temp 98.8 F (37.1 C) (Oral)   Resp (!) 31   Ht 5\' 1"  (1.549 m)   Wt 65.4 kg (144 lb 2.9 oz)   SpO2 94%   BMI 27.24 kg/m   HEMODYNAMICS:    VENTILATOR SETTINGS:    INTAKE / OUTPUT: I/O last 3 completed shifts: In: 2078.1 [I.V.:78.1; Other:2000] Out: -   PHYSICAL EXAMINATION: General:  AA female, Relaxed, cooperative, well-nourished,  found on Bipap, no acute distress. Neuro:  Alert and oriented x4, no focal deficits HEENT:  Atraumatic, normal cephalic, no JVD, no discharge noted, mucous membranes moist and pink Cardiovascular:  S1 and S2, RRR, no MRG Lungs:  Diminished throughout,, symmetrical expansion bilaterally, shallow rate, no crackles, wheezes, rhonchi noted Abdomen:  Soft, nontender, nondistended, no guarding  noted Musculoskeletal:  ROM active all extremities, no deformity, no edema noted Skin:  Warm, no ecchymosis, rash, ulcer noted  LABS:  BMET  Recent Labs Lab 08/15/16 1338 08/15/16 1906  NA 129* 134*  K 3.9 3.0*  CL 96* 106  CO2 19* 16*  BUN 17 19  CREATININE 1.63* 1.70*  GLUCOSE 583* 386*    Electrolytes  Recent Labs Lab 08/15/16 1338 08/15/16 1906  CALCIUM 9.0 7.9*    CBC  Recent Labs Lab 08/15/16 1338  WBC 16.3*  HGB 14.8  HCT 44.5  PLT 359    Coag's  Recent Labs Lab 08/15/16 1338  APTT 28  INR 1.06    Sepsis Markers No results for input(s): LATICACIDVEN, PROCALCITON, O2SATVEN in the last 168 hours.  ABG  Recent Labs Lab 08/15/16 1836  PHART 7.26*  PCO2ART 29*  PO2ART 42*    Liver Enzymes  Recent Labs Lab 08/15/16 1338 08/15/16 1906  AST 98* 105*  ALT 28 27  ALKPHOS 101 85  BILITOT 1.4* 1.0  ALBUMIN 3.4* 2.8*    Cardiac Enzymes  Recent Labs Lab 08/15/16 1338  TROPONINI 19.28*    Glucose  Recent Labs Lab 08/15/16 1530 08/15/16 1615 08/15/16 2034  GLUCAP 500* 564* 286*    Imaging Dg Chest 1 View  Result Date: 08/15/2016 CLINICAL DATA:  Respiratory failure EXAM: CHEST 1 VIEW COMPARISON:  January 22, 2010 FINDINGS: The mediastinal contour is normal. The heart size is enlarged. The diffuse airspace opacities identified throughout bilateral lungs. There is probably a minimal left pleural effusion. The osseous structures are normal. IMPRESSION: Congestive heart failure. Electronically Signed   By: Abelardo Diesel M.D.   On: 08/15/2016 19:25     STUDIES:  08/15/2016 cardiac catheterization: distal Cx Lesion 25% stenosed, first obtuse marginal branch first, first marginal lesion 100% stenosed, second obtuse Marginal branch 50% stenosed.  Left ventricle size is normal, EF 60%, no regional wall motion abnormalities.  There is mild (2+) mitral regurgitation.  CULTURES: non  ANTIBIOTICS: non  SIGNIFICANT  EVENTS: 9/12/i>>Pt went to cath lab, s/p stent placement procedure, pt was hypotensive, tachycardic, and short of breath.  Pt placed on bipap and low dose levophed, and closely monitored in the ICU.  LINES/TUBES: 9/12/>>Right IJ  DISCUSSION: 60 y.o. F presented with STEMI, s/p stent placement, pt developed SOB, hypotension, and tachycardia.  CXR concerning for CHF exacerbation.  Was diuresed, placed on bipap and low dose levophed.  ASSESSMENT / PLAN:  CARDIOVASCULAR A: Cardiogenic shock r/t STEMI  Elevated BNP Hx of hypertension Hx of hyperlipidemia  P:  Continuous telemetry Levophed gtt  Keep MAP >65 Trend BNP Cardiology following Continue Aspirin Continue Lipitor Continue Brilinta Hold amlodipine/metoprolol/Diovan-HCT due to hypotension  PULMONARY A:  Acute Respiratory failure related to CHF exacerbation  P:   Continue support with Bipap to keep O2 sats>92%, wean as tolerated Received Lasix 20 mg IV once CXR in AM  RENAL A:   Acute on Chronic Kidney Disease Mild hyponatremia possibly r/t fluid overload Hypokalemia Hypoalbumenia  P:   Trend BMP Strict I&O Replace electrolytes per ICU protocol Avoid Nephrotoxic drugs  GASTROINTESTINAL A:   No  active issues P:   Clear liquid diet Continue Pepcid  HEMATOLOGIC A:   No active issues P:  Continue Aspirin and Brilinta TEDs/SCDs  INFECTIOUS A:   No active issues P:   Monitor fever curve  ENDOCRINE A:    Type 2 Diabetes Mellitus   P:   POCT q4h SSI Hold metformin/glipizide Rest per primary  NEUROLOGIC A:   No active issues P:   Avoid sedating medications   FAMILY  - Updates: Pt updated regarding her status.     Innocence Schlotzhauer,AG-ACNP Pulmonary and [facility]   08/15/2016, 9:00 PM

## 2016-08-16 ENCOUNTER — Encounter: Payer: Self-pay | Admitting: Internal Medicine

## 2016-08-16 LAB — BASIC METABOLIC PANEL
Anion gap: 11 (ref 5–15)
BUN: 34 mg/dL — AB (ref 6–20)
CO2: 20 mmol/L — ABNORMAL LOW (ref 22–32)
CREATININE: 2.87 mg/dL — AB (ref 0.44–1.00)
Calcium: 8.5 mg/dL — ABNORMAL LOW (ref 8.9–10.3)
Chloride: 106 mmol/L (ref 101–111)
GFR calc Af Amer: 20 mL/min — ABNORMAL LOW (ref >60)
GFR, EST NON AFRICAN AMERICAN: 17 mL/min — AB (ref >60)
GLUCOSE: 160 mg/dL — AB (ref 65–99)
Potassium: 4.1 mmol/L (ref 3.5–5.1)
SODIUM: 137 mmol/L (ref 135–145)

## 2016-08-16 LAB — GLUCOSE, CAPILLARY
GLUCOSE-CAPILLARY: 235 mg/dL — AB (ref 65–99)
GLUCOSE-CAPILLARY: 207 mg/dL — AB (ref 65–99)
GLUCOSE-CAPILLARY: 170 mg/dL — AB (ref 65–99)
Glucose-Capillary: 141 mg/dL — ABNORMAL HIGH (ref 65–99)
Glucose-Capillary: 153 mg/dL — ABNORMAL HIGH (ref 65–99)

## 2016-08-16 LAB — BLOOD GAS, ARTERIAL
ACID-BASE DEFICIT: 6 mmol/L — AB (ref 0.0–2.0)
BICARBONATE: 17.6 mmol/L — AB (ref 20.0–28.0)
DELIVERY SYSTEMS: POSITIVE
Expiratory PAP: 5
FIO2: 0.6
Inspiratory PAP: 10
MECHANICAL RATE: 10
O2 Saturation: 91.5 %
PATIENT TEMPERATURE: 37
pCO2 arterial: 29 mmHg — ABNORMAL LOW (ref 32.0–48.0)
pH, Arterial: 7.39 (ref 7.350–7.450)
pO2, Arterial: 63 mmHg — ABNORMAL LOW (ref 83.0–108.0)

## 2016-08-16 LAB — MAGNESIUM: MAGNESIUM: 2 mg/dL (ref 1.7–2.4)

## 2016-08-16 LAB — PHOSPHORUS: Phosphorus: 4.3 mg/dL (ref 2.5–4.6)

## 2016-08-16 LAB — MRSA PCR SCREENING: MRSA BY PCR: NEGATIVE

## 2016-08-16 LAB — ECHOCARDIOGRAM COMPLETE
Height: 61 in
Weight: 2306.89 oz

## 2016-08-16 LAB — TROPONIN I
TROPONIN I: 38.24 ng/mL — AB (ref <0.03)
Troponin I: 51.74 ng/mL (ref <0.03)

## 2016-08-16 MED ORDER — HEPARIN SODIUM (PORCINE) 5000 UNIT/ML IJ SOLN
5000.0000 [IU] | Freq: Three times a day (TID) | INTRAMUSCULAR | Status: DC
  Administered 2016-08-16 – 2016-08-18 (×6): 5000 [IU] via SUBCUTANEOUS
  Filled 2016-08-16 (×6): qty 1

## 2016-08-16 MED ORDER — FUROSEMIDE 10 MG/ML IJ SOLN
20.0000 mg | Freq: Once | INTRAMUSCULAR | Status: AC
  Administered 2016-08-16: 20 mg via INTRAVENOUS

## 2016-08-16 MED ORDER — CHLORHEXIDINE GLUCONATE 0.12 % MT SOLN
15.0000 mL | Freq: Two times a day (BID) | OROMUCOSAL | Status: DC
  Administered 2016-08-16 (×2): 15 mL via OROMUCOSAL
  Filled 2016-08-16 (×2): qty 15

## 2016-08-16 MED ORDER — FUROSEMIDE 10 MG/ML IJ SOLN
INTRAMUSCULAR | Status: AC
  Administered 2016-08-16: 20 mg via INTRAVENOUS
  Filled 2016-08-16: qty 2

## 2016-08-16 MED ORDER — ORAL CARE MOUTH RINSE: 15.0000 mL | Freq: Two times a day (BID) | OROMUCOSAL | Status: DC

## 2016-08-16 MED ORDER — FUROSEMIDE 10 MG/ML IJ SOLN
60.0000 mg | Freq: Once | INTRAMUSCULAR | Status: AC
  Administered 2016-08-16: 60 mg via INTRAVENOUS
  Filled 2016-08-16: qty 6

## 2016-08-16 MED ORDER — INSULIN GLARGINE 100 UNIT/ML ~~LOC~~ SOLN: 13.0000 [IU] | Freq: Every day | SUBCUTANEOUS | Status: DC

## 2016-08-16 NOTE — Progress Notes (Signed)
[place] consulted for electrolyte management in this 44 yoF admitted for STEMI, possible CHF exacerbation and poorly controlled diabetes.  1. Electrolytes: BNP for 9/12.  K = 3.0 patient received KCl 10 mEq. Ca = 7.9 Na = 134 9/13 1400 STAT labs ordered BNP, phos and magnesium.  Pt has been getting furosemide and insulin, K will likely need to be replaced.   No Known Allergies  Patient Measurements: Height: 5\' 1"  (154.9 cm) Weight: 144 lb 2.9 oz (65.4 kg) IBW/kg (Calculated) : 47.8   Vital Signs: Temp: 98.2 F (36.8 C) (09/13 1200) Temp Source: Axillary (09/13 1200) BP: 90/74 (09/13 1400) Pulse Rate: 99 (09/13 1400) Intake/Output from previous day: 09/12 0701 - 09/13 0700 In: 2376.3 [I.V.:376.3] Out: 2 [Urine:2] Intake/Output from this shift: Total I/O In: 3 [I.V.:3] Out: -   Labs:  Recent Labs  08/15/16 1338  WBC 16.3*  HGB 14.8  HCT 44.5  PLT 359  APTT 28  INR 1.06     Recent Labs  08/15/16 1338 08/15/16 1906  NA 129* 134*  K 3.9 3.0*  CL 96* 106  CO2 19* 16*  GLUCOSE 583* 386*  BUN 17 19  CREATININE 1.63* 1.70*  CALCIUM 9.0 7.9*  PROT 8.4* 7.3  ALBUMIN 3.4* 2.8*  AST 98* 105*  ALT 28 27  ALKPHOS 101 85  BILITOT 1.4* 1.0  BILIDIR 0.1  --   IBILI 1.3*  --    Estimated Creatinine Clearance: 30.8 mL/min (by C-G formula based on SCr of 1.7 mg/dL (H)).    Recent Labs  08/16/16 0339 08/16/16 0722 08/16/16 1137  GLUCAP 235* 207* 170*    Medical History: Past Medical History:  Diagnosis Date  . Diabetes mellitus   . Hyperlipidemia   . Hypertension     Medications:  Scheduled:  . aspirin  81 mg Oral Daily  . atorvastatin  40 mg Oral Daily  . chlorhexidine  15 mL Mouth Rinse BID  . famotidine  20 mg Oral Daily  . heparin subcutaneous  5,000 Units Subcutaneous Q8H  . insulin aspart  0-15 Units Subcutaneous TID WC  . insulin aspart  0-5 Units Subcutaneous QHS  . mouth rinse  15 mL Mouth Rinse q12n4p  . sodium  chloride flush  3 mL Intravenous Q12H  . ticagrelor  90 mg Oral BID   Infusions:  . sodium chloride 10 mL/hr at 08/16/16 0100  . norepinephrine Stopped (08/16/16 0435)     Darrow Bussing, PharmD Pharmacy Resident 08/16/2016 3:19 PM

## 2016-08-16 NOTE — Progress Notes (Addendum)
Inpatient Diabetes Program Recommendations  AACE/ADA: New Consensus Statement on Inpatient Glycemic Control (2015)  Target Ranges:  Prepandial:   less than 140 mg/dL      Peak postprandial:   less than 180 mg/dL (1-2 hours)      Critically ill patients:  140 - 180 mg/dL  Results for Deborah [name], Deborah [name] (MRN [id number]) as of 08/16/2016 08:45  Ref. Range 08/15/2016 16:15 08/15/2016 20:34 08/15/2016 21:34 08/15/2016 23:31 08/16/2016 03:39 08/16/2016 07:22  Glucose-Capillary Latest Ref Range: 65 - 99 mg/dL 564 (HH) 286 (H) 222 (H) 263 (H) 235 (H) 207 (H)    Review of Glycemic Control  Diabetes history: DM2 Outpatient Diabetes medications: Glipizide 10 mg BID, Metformin 500 mg BID Current orders for Inpatient glycemic control: Novolog 0-15 units TID with meals, Novolog 0-5 units QHS  Inpatient Diabetes Program Recommendations:  Insulin - Basal: Please consider ordering Lantus 13 units daily (starting now). HgbA1C: Please order an A1C to evaluate glycemic control over the past 2-3 months.  Addendum 08/16/16@11 :61- Went to talk with patient regarding diabetes and outpatient regimen for diabetes control. Patient has BiPAP on and is very drowsy. When asked if she has been taking DM medications (Glipizide and Metformin) patient shook her head to indicate yes. Not able to communicate well due to BiPAP. Informed patient that diabetes coordinator will come back on 9/14 to talk with her again and hopefully she will be more alert and not requiring BiPAP.  Thanks, Barnie Alderman, RN, MSN, CDE Diabetes Coordinator Inpatient Diabetes Program [phone] (Team Pager from [place] to [place]) [phone] ([place] office) [phone] [facility] office) [phone] [facility] office)

## 2016-08-16 NOTE — Progress Notes (Signed)
[place] [facility] [place] NAME: Deborah [name]    MR#:  [id number]  DATE OF BIRTH:  November 27, 1956  SUBJECTIVE:  Medical  consult for management of diabetes. She' had acute STeMI, status post PCI, admitted to cardiology service. Patient has been having shortness of breath, started on BiPAP this morning. She is getting desaturated when the BiPAP is taken off. Saturations are going down to low 80s.   CHIEF COMPLAINT:   Chief Complaint  Patient presents with  . Chest Pain    REVIEW OF SYSTEMS:   ROS CONSTITUTIONAL: No fever, fatigue or weakness.  EYES: No blurred or double vision.  EARS, NOSE, AND THROAT: No tinnitus or ear pain.  RESPIRATORY: sob. GASTROINTESTINAL: No nausea, vomiting, diarrhea or abdominal pain.  GENITOURINARY: No dysuria, hematuria.  ENDOCRINE: No polyuria, nocturia,  HEMATOLOGY: No anemia, easy bruising or bleeding SKIN: No rash or lesion. MUSCULOSKELETAL: No joint pain or arthritis.   NEUROLOGIC: No tingling, numbness, weakness.  PSYCHIATRY: No anxiety or depression.   DRUG ALLERGIES:  No Known Allergies  VITALS:  Blood pressure 93/74, pulse 87, temperature 98.2 F (36.8 C), temperature source Axillary, resp. rate (!) 28, height 5\' 1"  (1.549 m), weight 65.4 kg (144 lb 2.9 oz), SpO2 96 %.  PHYSICAL EXAMINATION:  GENERAL:  60 y.o.-year-old patient lying in the bed with no acute distress.  EYES: Pupils equal, round, reactive to light and accommodation. No scleral icterus. Extraocular muscles intact.  HEENT: Head atraumatic, normocephalic. Oropharynx and nasopharynx clear.  NECK:  Supple, no jugular venous distention. No thyroid enlargement, no tenderness.  LUNGS: Normal breath sounds bilaterally, no wheezing, rales,rhonchi or crepitation. No use of accessory muscles of respiration.  CARDIOVASCULAR: S1, S2 normal. No murmurs, rubs, or gallops.  ABDOMEN: Soft, nontender, nondistended. Bowel sounds present. No organomegaly  or mass.  EXTREMITIES: No pedal edema, cyanosis, or clubbing.  NEUROLOGIC: Cranial nerves II through XII are intact. Muscle strength 5/5 in all extremities. Sensation intact. Gait not checked.  PSYCHIATRIC: The patient is alert and oriented x 3.  SKIN: No obvious rash, lesion, or ulcer.    LABORATORY PANEL:   CBC  Recent Labs Lab 08/15/16 1338  WBC 16.3*  HGB 14.8  HCT 44.5  PLT 359   ------------------------------------------------------------------------------------------------------------------  Chemistries   Recent Labs Lab 08/15/16 1906  NA 134*  K 3.0*  CL 106  CO2 16*  GLUCOSE 386*  BUN 19  CREATININE 1.70*  CALCIUM 7.9*  AST 105*  ALT 27  ALKPHOS 85  BILITOT 1.0   ------------------------------------------------------------------------------------------------------------------  Cardiac Enzymes  Recent Labs Lab 08/15/16 1338  TROPONINI 19.28*   ------------------------------------------------------------------------------------------------------------------  RADIOLOGY:  Dg Chest 1 View  Result Date: 08/15/2016 CLINICAL DATA:  Respiratory failure EXAM: CHEST 1 VIEW COMPARISON:  January 22, 2010 FINDINGS: The mediastinal contour is normal. The heart size is enlarged. The diffuse airspace opacities identified throughout bilateral lungs. There is probably a minimal left pleural effusion. The osseous structures are normal. IMPRESSION: Congestive heart failure. Electronically Signed   By: Abelardo Diesel M.D.   On: 08/15/2016 19:25   Dg Chest Port 1 View  Result Date: 08/15/2016 CLINICAL DATA:  Central line placement EXAM: PORTABLE CHEST 1 VIEW COMPARISON:  August 15, 2016 7:04 p.m. FINDINGS: Right central venous line is identified with distal tip in superior vena cava. There is no pneumothorax. The heart size mildly enlarged. Diffuse airspace opacities identified throughout both lungs. The bones are unchanged. IMPRESSION: Right central venous line distal  tip in the superior vena cava. There is no pneumothorax. Pulmonary edema unchanged compared prior exam. Electronically Signed   By: Abelardo Diesel M.D.   On: 08/15/2016 21:10    EKG:   Orders placed or performed during the hospital encounter of 08/15/16  . EKG 12-Lead  . EKG 12-Lead  . EKG 12-Lead  . EKG 12-Lead  . EKG 12-Lead immediately post procedure  . EKG 12-Lead  . EKG 12-Lead immediately post procedure  . EKG 12-Lead    ASSESSMENT AND PLAN:   11 acute respiratory failure secondary to possible fluid overload and diastolic heart failure: Patient received IV fluids for the patient for catheter and after the cardiac catheter. Patient is on BiPAP at this time., And in the BiPAP support for respiratory status, continue IV Lasix. #2 poorly controlled diabetes mellitus type 2: Required insulin drip yesterday. Now she is off the insulin drip. Continue sliding scale with coverage. Seen by diabetic coordinators. Likely start patient on Lantus  When she eats well. Hemoglobin A1c. 3 ST elevation MI: Status post PCI, patient had a stent placed in first  Marginal artery. Continue aspirin, statins, Brilinta,. Beta blockers are not given at this time  because of hypotension at this time.   All the records are reviewed and case discussed with Care Management/Social Workerr. Management plans discussed with the patient, family and they are in agreement.  CODE STATUS: full  TOTAL TIME TAKING CARE OF THIS PATIENT: 59minutes. (cct) POSSIBLE D/C IN 1-2 DAYS, DEPENDING ON CLINICAL CONDITION.   Epifanio Lesches M.D on 08/16/2016 at 1:17 PM  Between 7am to 6pm - Pager - [phone]  After 6pm go to [email or web address] - password [email or web address] [facility]  Office  [phone]  without BiPAP CC: Primary care physician; Rica Mast, MD   Note: This dictation was prepared with Dragon dictation along with smaller phrase technology. Any transcriptional errors that result from  this process are unintentional.

## 2016-08-16 NOTE — Progress Notes (Addendum)
Pt has remained alert and oriented x 4 with no c/o pain. Respirations have been even and unlabored with bi-pap. Pt has remained on bi-pap at 60%. Three failed attempts to wean pt off bi-pap -tachypneic, desat in mid/lower 80's with accessory muscle use. Fursomide 60mg  given this am. Pt has voided x 1. Lung sounds clear/diminished. NSR on cardiac monitor.  Family has been at bedside. Pt is currently placed back on bipap-will try to wean again in a few hours.

## 2016-08-16 NOTE — Progress Notes (Signed)
Dr Chancy Milroy notified of critical troponin of 51.74. No new orders at this time.

## 2016-08-17 ENCOUNTER — Inpatient Hospital Stay: Payer: Medicaid Other

## 2016-08-17 DIAGNOSIS — R570 Cardiogenic shock: Secondary | ICD-10-CM

## 2016-08-17 DIAGNOSIS — J9690 Respiratory failure, unspecified, unspecified whether with hypoxia or hypercapnia: Secondary | ICD-10-CM | POA: Diagnosis present

## 2016-08-17 DIAGNOSIS — J9601 Acute respiratory failure with hypoxia: Secondary | ICD-10-CM

## 2016-08-17 DIAGNOSIS — N179 Acute kidney failure, unspecified: Secondary | ICD-10-CM

## 2016-08-17 DIAGNOSIS — I2101 ST elevation (STEMI) myocardial infarction involving left main coronary artery: Secondary | ICD-10-CM

## 2016-08-17 HISTORY — DX: Respiratory failure, unspecified, unspecified whether with hypoxia or hypercapnia: J96.90

## 2016-08-17 HISTORY — DX: Cardiogenic shock: R57.0

## 2016-08-17 LAB — BLOOD GAS, ARTERIAL
ACID-BASE DEFICIT: 13.7 mmol/L — AB (ref 0.0–2.0)
ACID-BASE DEFICIT: 19.2 mmol/L — AB (ref 0.0–2.0)
Bicarbonate: 11.3 mmol/L — ABNORMAL LOW (ref 20.0–28.0)
Bicarbonate: 10.1 mmol/L — ABNORMAL LOW (ref 20.0–28.0)
DELIVERY SYSTEMS: POSITIVE
Expiratory PAP: 5
FIO2: 0.6
FIO2: 70
Inspiratory PAP: 10
MECHVT: 450 mL
Mechanical Rate: 10
O2 Saturation: 90.9 %
O2 Saturation: 94.7 %
PATIENT TEMPERATURE: 37
PATIENT TEMPERATURE: 37
PCO2 ART: 24 mmHg — AB (ref 32.0–48.0)
PEEP: 10 cmH2O
PH ART: 7.28 — AB (ref 7.350–7.450)
PH ART: 7.07 — AB (ref 7.350–7.450)
PO2 ART: 102 mmHg (ref 83.0–108.0)
RATE: 18 resp/min
pCO2 arterial: 35 mmHg (ref 32.0–48.0)
pO2, Arterial: 69 mmHg — ABNORMAL LOW (ref 83.0–108.0)

## 2016-08-17 LAB — CBC
HCT: 33.7 % — ABNORMAL LOW (ref 35.0–47.0)
HEMATOCRIT: 42 % (ref 35.0–47.0)
HEMOGLOBIN: 13.7 g/dL (ref 12.0–16.0)
Hemoglobin: 11.4 g/dL — ABNORMAL LOW (ref 12.0–16.0)
MCH: 28.2 pg (ref 26.0–34.0)
MCH: 28.8 pg (ref 26.0–34.0)
MCHC: 32.7 g/dL (ref 32.0–36.0)
MCHC: 33.9 g/dL (ref 32.0–36.0)
MCV: 86.3 fL (ref 80.0–100.0)
MCV: 85 fL (ref 80.0–100.0)
PLATELETS: 253 10*3/uL (ref 150–440)
Platelets: 300 10*3/uL (ref 150–440)
RBC: 4.87 MIL/uL (ref 3.80–5.20)
RBC: 3.97 MIL/uL (ref 3.80–5.20)
RDW: 14 % (ref 11.5–14.5)
RDW: 13.8 % (ref 11.5–14.5)
WBC: 28.6 10*3/uL — ABNORMAL HIGH (ref 3.6–11.0)
WBC: 26.3 10*3/uL — ABNORMAL HIGH (ref 3.6–11.0)

## 2016-08-17 LAB — TROPONIN I: Troponin I: 26.79 ng/mL (ref <0.03)

## 2016-08-17 LAB — BASIC METABOLIC PANEL
Anion gap: 11 (ref 5–15)
BUN: 38 mg/dL — AB (ref 6–20)
CO2: 13 mmol/L — ABNORMAL LOW (ref 22–32)
Calcium: 6.7 mg/dL — ABNORMAL LOW (ref 8.9–10.3)
Chloride: 114 mmol/L — ABNORMAL HIGH (ref 101–111)
Creatinine, Ser: 3.24 mg/dL — ABNORMAL HIGH (ref 0.44–1.00)
GFR calc Af Amer: 17 mL/min — ABNORMAL LOW (ref >60)
GFR, EST NON AFRICAN AMERICAN: 15 mL/min — AB (ref >60)
GLUCOSE: 195 mg/dL — AB (ref 65–99)
POTASSIUM: 3.1 mmol/L — AB (ref 3.5–5.1)
Sodium: 138 mmol/L (ref 135–145)

## 2016-08-17 LAB — GLUCOSE, CAPILLARY
GLUCOSE-CAPILLARY: 192 mg/dL — AB (ref 65–99)
GLUCOSE-CAPILLARY: 291 mg/dL — AB (ref 65–99)
GLUCOSE-CAPILLARY: 337 mg/dL — AB (ref 65–99)
GLUCOSE-CAPILLARY: 278 mg/dL — AB (ref 65–99)
GLUCOSE-CAPILLARY: 230 mg/dL — AB (ref 65–99)
GLUCOSE-CAPILLARY: 189 mg/dL — AB (ref 65–99)
GLUCOSE-CAPILLARY: 195 mg/dL — AB (ref 65–99)
Glucose-Capillary: 114 mg/dL — ABNORMAL HIGH (ref 65–99)
Glucose-Capillary: 125 mg/dL — ABNORMAL HIGH (ref 65–99)
Glucose-Capillary: 148 mg/dL — ABNORMAL HIGH (ref 65–99)
Glucose-Capillary: 157 mg/dL — ABNORMAL HIGH (ref 65–99)
Glucose-Capillary: 284 mg/dL — ABNORMAL HIGH (ref 65–99)
Glucose-Capillary: 92 mg/dL (ref 65–99)

## 2016-08-17 LAB — MAGNESIUM
Magnesium: 1.6 mg/dL — ABNORMAL LOW (ref 1.7–2.4)
Magnesium: 1.7 mg/dL (ref 1.7–2.4)

## 2016-08-17 LAB — HEMOGLOBIN A1C
Hgb A1c MFr Bld: 13.3 % — ABNORMAL HIGH (ref 4.8–5.6)
Mean Plasma Glucose: 335 mg/dL

## 2016-08-17 LAB — RENAL FUNCTION PANEL
ALBUMIN: 2.3 g/dL — AB (ref 3.5–5.0)
Anion gap: 6 (ref 5–15)
BUN: 37 mg/dL — AB (ref 6–20)
CALCIUM: 7.6 mg/dL — AB (ref 8.9–10.3)
CHLORIDE: 103 mmol/L (ref 101–111)
CO2: 20 mmol/L — ABNORMAL LOW (ref 22–32)
CREATININE: 3.38 mg/dL — AB (ref 0.44–1.00)
GFR, EST AFRICAN AMERICAN: 16 mL/min — AB (ref >60)
GFR, EST NON AFRICAN AMERICAN: 14 mL/min — AB (ref >60)
Glucose, Bld: 423 mg/dL — ABNORMAL HIGH (ref 65–99)
PHOSPHORUS: 4 mg/dL (ref 2.5–4.6)
Potassium: 3.8 mmol/L (ref 3.5–5.1)
Sodium: 129 mmol/L — ABNORMAL LOW (ref 135–145)

## 2016-08-17 LAB — PHOSPHORUS
PHOSPHORUS: 5 mg/dL — AB (ref 2.5–4.6)
PHOSPHORUS: 4.2 mg/dL (ref 2.5–4.6)

## 2016-08-17 MED ORDER — SODIUM CHLORIDE 0.9 % IV SOLN: 250.0000 mL | INTRAVENOUS | Status: DC | PRN

## 2016-08-17 MED ORDER — FENTANYL 2500MCG IN NS 250ML (10MCG/ML) PREMIX INFUSION
40.0000 ug/h | INTRAVENOUS | Status: DC
  Administered 2016-08-17: 25 ug/h via INTRAVENOUS
  Administered 2016-08-17: 30 ug/h via INTRAVENOUS
  Filled 2016-08-17: qty 250

## 2016-08-17 MED ORDER — NOREPINEPHRINE BITARTRATE 1 MG/ML IV SOLN
0.0000 ug/min | INTRAVENOUS | Status: DC
  Administered 2016-08-17: 6 ug/min via INTRAVENOUS
  Administered 2016-08-18: 4 ug/min via INTRAVENOUS
  Filled 2016-08-17: qty 16

## 2016-08-17 MED ORDER — VITAL HIGH PROTEIN PO LIQD
1000.0000 mL | ORAL | Status: DC
  Administered 2016-08-17: 1000 mL

## 2016-08-17 MED ORDER — MORPHINE SULFATE (PF) 2 MG/ML IV SOLN
INTRAVENOUS | Status: AC
  Administered 2016-08-17: 2 mg via INTRAVENOUS
  Filled 2016-08-17: qty 1

## 2016-08-17 MED ORDER — MORPHINE SULFATE (PF) 2 MG/ML IV SOLN
2.0000 mg | Freq: Once | INTRAVENOUS | Status: AC
  Administered 2016-08-17: 2 mg via INTRAVENOUS

## 2016-08-17 MED ORDER — STERILE WATER FOR INJECTION IJ SOLN
INTRAMUSCULAR | Status: AC
  Administered 2016-08-17: 08:00:00
  Filled 2016-08-17: qty 10

## 2016-08-17 MED ORDER — INSULIN REGULAR HUMAN 100 UNIT/ML IJ SOLN
INTRAMUSCULAR | Status: DC
  Administered 2016-08-17: 2.8 [IU]/h via INTRAVENOUS
  Filled 2016-08-17: qty 2.5

## 2016-08-17 MED ORDER — HEPARIN SODIUM (PORCINE) 5000 UNIT/ML IJ SOLN: 5000.0000 [IU] | Freq: Three times a day (TID) | INTRAMUSCULAR | Status: DC

## 2016-08-17 MED ORDER — ACETAMINOPHEN 325 MG PO TABS: 650.0000 mg | ORAL_TABLET | ORAL | Status: DC | PRN

## 2016-08-17 MED ORDER — SODIUM CHLORIDE 0.9 % IV BOLUS (SEPSIS)
250.0000 mL | Freq: Once | INTRAVENOUS | Status: AC
  Administered 2016-08-17: 250 mL via INTRAVENOUS

## 2016-08-17 MED ORDER — SODIUM CHLORIDE 0.9 % FOR CRRT
INTRAVENOUS_CENTRAL | Status: DC | PRN
  Filled 2016-08-17: qty 1000

## 2016-08-17 MED ORDER — SODIUM CHLORIDE 0.9% FLUSH: 3.0000 mL | INTRAVENOUS | Status: DC | PRN

## 2016-08-17 MED ORDER — ORAL CARE MOUTH RINSE
15.0000 mL | OROMUCOSAL | Status: DC
  Administered 2016-08-17 – 2016-08-18 (×11): 15 mL via OROMUCOSAL

## 2016-08-17 MED ORDER — MORPHINE SULFATE (PF) 2 MG/ML IV SOLN: 1.0000 mg | INTRAVENOUS | Status: DC | PRN

## 2016-08-17 MED ORDER — SODIUM CHLORIDE 0.9% FLUSH
3.0000 mL | Freq: Two times a day (BID) | INTRAVENOUS | Status: DC
  Administered 2016-08-17 – 2016-08-18 (×3): 3 mL via INTRAVENOUS

## 2016-08-17 MED ORDER — VECURONIUM BROMIDE 10 MG IV SOLR
INTRAVENOUS | Status: AC
  Administered 2016-08-17: 10 mg via INTRAVENOUS
  Filled 2016-08-17: qty 10

## 2016-08-17 MED ORDER — FENTANYL CITRATE (PF) 100 MCG/2ML IJ SOLN
INTRAMUSCULAR | Status: AC
Start: 2016-08-17 — End: 2016-08-17
  Administered 2016-08-17: 100 ug via INTRAVENOUS
  Filled 2016-08-17: qty 4

## 2016-08-17 MED ORDER — FENTANYL CITRATE (PF) 100 MCG/2ML IJ SOLN
100.0000 ug | Freq: Once | INTRAMUSCULAR | Status: AC
Start: 2016-08-17 — End: 2016-08-17
  Administered 2016-08-17: 100 ug via INTRAVENOUS

## 2016-08-17 MED ORDER — MAGNESIUM SULFATE 2 GM/50ML IV SOLN
2.0000 g | Freq: Once | INTRAVENOUS | Status: AC
  Administered 2016-08-17: 2 g via INTRAVENOUS
  Filled 2016-08-17: qty 50

## 2016-08-17 MED ORDER — FAMOTIDINE IN NACL 20-0.9 MG/50ML-% IV SOLN: 20.0000 mg | Freq: Two times a day (BID) | INTRAVENOUS | Status: DC

## 2016-08-17 MED ORDER — HEPARIN SODIUM (PORCINE) 1000 UNIT/ML DIALYSIS: 1000.0000 [IU] | INTRAMUSCULAR | Status: DC | PRN

## 2016-08-17 MED ORDER — FAMOTIDINE IN NACL 20-0.9 MG/50ML-% IV SOLN
20.0000 mg | INTRAVENOUS | Status: DC
  Administered 2016-08-17: 20 mg via INTRAVENOUS
  Filled 2016-08-17: qty 50

## 2016-08-17 MED ORDER — MIDAZOLAM HCL 2 MG/2ML IJ SOLN
INTRAMUSCULAR | Status: AC
  Administered 2016-08-17: 2 mg via INTRAVENOUS
  Filled 2016-08-17: qty 4

## 2016-08-17 MED ORDER — PUREFLOW DIALYSIS SOLUTION
INTRAVENOUS | Status: DC
  Administered 2016-08-17: 14:00:00 via INTRAVENOUS_CENTRAL

## 2016-08-17 MED ORDER — ONDANSETRON HCL 4 MG/2ML IJ SOLN: 4.0000 mg | Freq: Four times a day (QID) | INTRAMUSCULAR | Status: DC | PRN

## 2016-08-17 MED ORDER — POTASSIUM CHLORIDE 10 MEQ/100ML IV SOLN
10.0000 meq | INTRAVENOUS | Status: AC
Start: 2016-08-17 — End: 2016-08-17
  Administered 2016-08-17 (×4): 10 meq via INTRAVENOUS
  Filled 2016-08-17 (×4): qty 100

## 2016-08-17 MED ORDER — MIDAZOLAM HCL 2 MG/2ML IJ SOLN
2.0000 mg | Freq: Once | INTRAMUSCULAR | Status: AC
  Administered 2016-08-17: 2 mg via INTRAVENOUS

## 2016-08-17 MED ORDER — FAMOTIDINE 20 MG PO TABS
20.0000 mg | ORAL_TABLET | Freq: Two times a day (BID) | ORAL | Status: DC
  Filled 2016-08-17: qty 1

## 2016-08-17 MED ORDER — PRO-STAT SUGAR FREE PO LIQD
30.0000 mL | Freq: Two times a day (BID) | ORAL | Status: DC
  Administered 2016-08-17: 30 mL

## 2016-08-17 MED ORDER — FENTANYL 2500MCG IN NS 250ML (10MCG/ML) PREMIX INFUSION
25.0000 ug/h | INTRAVENOUS | Status: DC
  Administered 2016-08-17: 30 ug/h via INTRAVENOUS
  Administered 2016-08-18: 25 ug/h via INTRAVENOUS
  Administered 2016-08-18: 125 ug/h via INTRAVENOUS
  Filled 2016-08-17: qty 250

## 2016-08-17 MED ORDER — INSULIN ASPART 100 UNIT/ML ~~LOC~~ SOLN: 2.0000 [IU] | SUBCUTANEOUS | Status: DC

## 2016-08-17 MED ORDER — IPRATROPIUM-ALBUTEROL 0.5-2.5 (3) MG/3ML IN SOLN: 3.0000 mL | RESPIRATORY_TRACT | Status: DC | PRN

## 2016-08-17 MED ORDER — CHLORHEXIDINE GLUCONATE 0.12% ORAL RINSE (MEDLINE KIT)
15.0000 mL | Freq: Two times a day (BID) | OROMUCOSAL | Status: DC
  Administered 2016-08-17 – 2016-08-18 (×3): 15 mL via OROMUCOSAL

## 2016-08-17 MED ORDER — VECURONIUM BROMIDE 10 MG IV SOLR
10.0000 mg | Freq: Once | INTRAVENOUS | Status: AC
  Administered 2016-08-17: 10 mg via INTRAVENOUS

## 2016-08-17 NOTE — Care Management Note (Signed)
Case Management Note  Patient Details  Name: Deborah [name] MRN: [id number] Date of Birth: 11-09-1956  Subjective/Objective:                  RNCM met with patient's son Deborah [name] [phone] and daughter Deborah [name] [phone] to discuss discharge planning. Patient is vented currently and son Deborah [name] is struggling with patient's current status. I and patient's RN spoke with him to try to comfort him- he appreciate it. Deborah [name] would like to be first line of contact for any medical needs. Patient is from home with Deborah [name]. She was independent with daily activity. Her PCP is Deborah [name]. Patient had been going to [name] but per Deborah [name] "they were not meeting her mother's medical needs". Deborah [name] has been paying for physician visits for her mother. Patient is potentially going to need outpatient dialysis. She was not taking her medications per Deborah [name] due to cost- patient does not have health insurance. Deborah [name] wants her mother to have home health services- [facility] through [facility] nursing may be appropriate however it is too soon to make those arrangements. Both children are considering SNF if patient too weak to return home. They have met with financial advisors about Medicare and Medicaid per Deborah [name].   Action/Plan: Application to [facility] and Medication Management shared with Deborah [name]. RNCM to continue to follow.   Expected Discharge Date:                  Expected Discharge Plan:     In-House Referral:     Discharge planning Services  CM Consult, Medication Assistance, Deborah [name]  Post Acute Care Choice:    Choice offered to:  Adult Children  DME Arranged:    DME Agency:     HH Arranged:    HH Agency:     Status of Service:  In process, will continue to follow  If discussed at Long Length of Stay Meetings, dates discussed:    Additional Comments:  Deborah Garfinkel, RN 08/17/2016, 2:03 PM

## 2016-08-17 NOTE — Progress Notes (Signed)
Pt in respiratory distress. On Bi-PAP. Dr. Mortimer Fries at bedside. Pt sedated and intubated. OG placed. Dr. Mortimer Fries spoke to family prior to intubation. Dorella Laster E 8:02 AM 08/17/2016

## 2016-08-17 NOTE — Progress Notes (Signed)
Initial Nutrition Assessment  DOCUMENTATION CODES:   Not applicable  INTERVENTION:  TF: recommend starting Vital High Protein at goal of 65 ml/hr providing 137 g of protein, 1560 kcals, 1310 mL of free water. Recommend tube maintenance free water flushes only at present. PEPuP initiated. Bowel regimen started. Continue to assess   NUTRITION DIAGNOSIS:   Inadequate oral intake related to acute illness as evidenced by NPO status.  GOAL:   Provide needs based on ASPEN/SCCM guidelines  MONITOR:   TF tolerance, Labs, Weight trends  REASON FOR ASSESSMENT:   Consult Enteral/tube feeding initiation and management  ASSESSMENT:   60 yo female admitted with STEMI s/p stent placement on 9/12. Pt developed worsening respiratory and renal failure requiring intubation and initiation of CRRT on 9/14   OG tube located in stomach, Adult Tube Feeding Protocol entered this AM  Patient is currently intubated on ventilator support MV: 8 L/min Temp (24hrs), Avg:99.1 F (37.3 C), Min:98.2 F (36.8 C), Max:100.7 F (38.2 C)   Past Medical History:  Diagnosis Date  . Diabetes mellitus   . Hyperlipidemia   . Hypertension     Diet Order:  Diet NPO time specified  Skin:  Reviewed, no issues  Last BM:  9/11   Labs: potassium 3.1 (supplemented), magnesium 1.6  Glucose Profile:   Recent Labs  08/16/16 2129 08/17/16 0222 08/17/16 0736  GLUCAP 153* 192* 284*   Meds: ss novolog  Height:   Ht Readings from Last 1 Encounters:  08/15/16 5\' 1"  (1.549 m)    Weight:   Wt Readings from Last 1 Encounters:  08/17/16 149 lb 11.1 oz (67.9 kg)    Filed Weights   08/15/16 1333 08/15/16 1610 08/17/16 0900  Weight: 149 lb (67.6 kg) 144 lb 2.9 oz (65.4 kg) 149 lb 11.1 oz (67.9 kg)    BMI:  Body mass index is 28.28 kg/m.  Estimated Nutritional Needs:   Kcal:  1559 kcals  Protein:  102-136 g  Fluid:  per MD  EDUCATION NEEDS:   No education needs identified at this time    Kerman Passey [place], [place], [place] [phone] Pager  [phone] Weekend/On-Call Pager

## 2016-08-17 NOTE — Progress Notes (Signed)
Subjective:  Patient doing worse today history failure acidosis worsening renal insufficiency with acute on chronic. Critical care as proceeding with intubation as well as CRT. Patient appears to be stable from a cardiac standpoint except for mild sinus tachycardia. No evidence of chest pain or ischemia no worsening heart failure  Objective:  Vital Signs in the last 24 hours: Temp:  [98.3 F (36.8 C)-100.7 F (38.2 C)] 100.7 F (38.2 C) (09/14 0200) Pulse Rate:  [85-118] 85 (09/14 1300) Resp:  [18-41] 18 (09/14 1300) BP: (66-119)/(54-84) 66/58 (09/14 1300) SpO2:  [91 %-99 %] 92 % (09/14 1300) FiO2 (%):  [60 %-80 %] 60 % (09/14 1300) Weight:  [67.9 kg (149 lb 11.1 oz)] 67.9 kg (149 lb 11.1 oz) (09/14 0900)  Intake/Output from previous day: 09/13 0701 - 09/14 0700 In: 640.3 [P.O.:580; I.V.:60.3] Out: -  Intake/Output from this shift: Total I/O In: 203.1 [I.V.:53.1; IV Piggyback:150] Out: 150 [Urine:150]  Physical Exam: HEENT within normal limits patient currently on BiPAP laying in bed with tachypnea Neck exam supple with mild JVD bilaterally Heart exam tachycardic systolic ejection murmur positive S4 Lung exam bilateral rhonchi diffusely wheezing scattered moderate crackles in the bases Extremities exam within normal limits Abdominal exam is benign positive bowel sounds normal and there are no tenderness Neurologic exam intact skin is abnormal  Lab Results:  Recent Labs  08/15/16 1338 08/17/16 0447  WBC 16.3* 28.6*  HGB 14.8 13.7  PLT 359 300    Recent Labs  08/16/16 1737 08/17/16 0431  NA 137 138  K 4.1 3.1*  CL 106 114*  CO2 20* 13*  GLUCOSE 160* 195*  BUN 34* 38*  CREATININE 2.87* 3.24*    Recent Labs  08/16/16 2308 08/17/16 0431  TROPONINI 38.24* 26.79*   Hepatic Function Panel  Recent Labs  08/15/16 1338 08/15/16 1906  PROT 8.4* 7.3  ALBUMIN 3.4* 2.8*  AST 98* 105*  ALT 28 27  ALKPHOS 101 85  BILITOT 1.4* 1.0  BILIDIR 0.1  --   IBILI  1.3*  --    No results for input(s): CHOL in the last 72 hours. No results for input(s): PROTIME in the last 72 hours.  Imaging: Imaging results have been reviewed  Cardiac Studies:  Assessment/Plan:  Status post STEMI postop day 2, OM1  Respiratory failure Congestive heart failure history failure Diastolic dysfunction Acidosis secondary to renal insufficiency Acute on chronic renal insufficiency Diabetes2 complicated with renal insufficiency Hypokalemia Elevated troponin peak of 40 post MI Tachycardia possibly related to respiratory distress . PLAN Continue supplemental and stent therapy Agree with diuretic therapy with Lasix for heart failure Agree with intubation because of impending respiratory failure Appreciate critical care pulmonary input Continue nephrology input for acute on chronic renal insufficiency Continue aspirin Brilinta post MI DVT prophylaxis Continue statin therapy for hyperlipidemia Continue beta blocker therapy post MI Avoid ACE inhibitor at this point because of acute on chronic renal insufficiency Continue diabetes management sided scale insulin Case discussed with critical care physician Dr. Mortimer Fries as well as family Patient has taken a significant downturn in her condition She has worsened because of multisystem failure. We will continue aggressive ICU level care   LOS: 2 days    CALLWOOD,DWAYNE D. 08/17/2016, 2:22 PM

## 2016-08-17 NOTE — Procedures (Signed)
Central Venous Dailysis Catheter Placement: Indication: Hemo Dialysis/CRRT   Consent:emergent  Risks and benefits explained in detail including risk of infection, bleeding, respiratory failure and death..   Hand washing performed prior to starting the procedure.   Procedure: An active timeout was performed and correct patient, name, & ID confirmed.  After explaining risk and benefits, patient was positioned correctly for central venous access. Patient was prepped using strict sterile technique including chlorohexadine preps, sterile drape, sterile gown and sterile gloves.  The area was prepped, draped and anesthetized in the usual sterile manner. Patient comfort was obtained.  A triple lumen catheter was placed in LEFT IJ  Vein There was good blood return, catheter caps were placed on lumens, catheter flushed easily, the line was secured and a sterile dressing and BIO-PATCH applied.   Ultrasound was used to visualize vasculature and guidance of needle.   Number of Attempts: 1 Complications:none  Estimated Blood Loss: none Chest Radiograph indicated and ordered.  Operator: Geana Walts.   Corrin Parker, M.D.  [facility]  Medical Director [place] Director [facility] Cardio-Pulmonary Department

## 2016-08-17 NOTE — Progress Notes (Signed)
[place] Progress Note Patient Name: Deborah [name] DOB: 21-Feb-1956 MRN: [id number]   Date of Service  08/17/2016  HPI/Events of Note  Patient currently with shock on vasopressor infusion through PICC line. Attempting to limit IV fluid infusions.   eICU Interventions  Switching to quadruple strength Levophed to maintain arterial pressure      Intervention Category Major Interventions: Shock - evaluation and management  Tera Partridge 08/17/2016, 8:54 PM

## 2016-08-17 NOTE — Progress Notes (Signed)
[place] consulted for electrolyte management in this 57 yoF admitted for STEMI, possible CHF exacerbation and poorly controlled diabetes. Patient started on CRRT  1. Electrolytes: K= 3.8, Mg: 1.7, Phos 4.2- No replacement needed this evening. Will f/u with am labs.    2. No medication adjustments needed for CRRT.   No Known Allergies  Patient Measurements: Height: 5' 1"  (154.9 cm) Weight: 149 lb 11.1 oz (67.9 kg) IBW/kg (Calculated) : 47.8   Vital Signs: Temp: 97.5 F (36.4 C) (09/14 1600) Temp Source: Axillary (09/14 1600) BP: 102/71 (09/14 1600) Pulse Rate: 92 (09/14 1600) Intake/Output from previous day: 09/13 0701 - 09/14 0700 In: 640.3 [P.O.:580; I.V.:60.3] Out: -  Intake/Output from this shift: Total I/O In: 203.1 [I.V.:53.1; IV Piggyback:150] Out: 150 [Urine:150]  Labs:  Recent Labs  08/15/16 1338 08/17/16 0447  WBC 16.3* 28.6*  HGB 14.8 13.7  HCT 44.5 42.0  PLT 359 300  APTT 28  --   INR 1.06  --      Recent Labs  08/15/16 1338 08/15/16 1906  08/16/16 1737 08/17/16 0431 08/17/16 1600 08/17/16 1601  NA 129* 134*  --  137 138 129*  --   K 3.9 3.0*  --  4.1 3.1* 3.8  --   CL 96* 106  --  106 114* 103  --   CO2 19* 16*  --  20* 13* 20*  --   GLUCOSE 583* 386*  --  160* 195* 423*  --   BUN 17 19  --  34* 38* 37*  --   CREATININE 1.63* 1.70*  --  2.87* 3.24* 3.38*  --   CALCIUM 9.0 7.9*  --  8.5* 6.7* 7.6*  --   MG  --   --   --  2.0 1.6*  --  1.7  PHOS  --   --   < > 4.3 5.0* 4.0 4.2  PROT 8.4* 7.3  --   --   --   --   --   ALBUMIN 3.4* 2.8*  --   --   --  2.3*  --   AST 98* 105*  --   --   --   --   --   ALT 28 27  --   --   --   --   --   ALKPHOS 101 85  --   --   --   --   --   BILITOT 1.4* 1.0  --   --   --   --   --   BILIDIR 0.1  --   --   --   --   --   --   IBILI 1.3*  --   --   --   --   --   --   < > = values in this interval not displayed. Estimated Creatinine Clearance: 15.8 mL/min (by C-G formula based on SCr of  3.38 mg/dL (H)).    Recent Labs  08/17/16 0222 08/17/16 0736 08/17/16 1132  GLUCAP 192* 284* 291*    Medical History: Past Medical History:  Diagnosis Date  . Diabetes mellitus   . Hyperlipidemia   . Hypertension     Medications:  Scheduled:  . aspirin  81 mg Oral Daily  . atorvastatin  40 mg Oral Daily  . chlorhexidine gluconate (MEDLINE KIT)  15 mL Mouth Rinse BID  . famotidine (PEPCID) IV  20 mg Intravenous Q24H  . heparin subcutaneous  5,000 Units  Subcutaneous Q8H  . magnesium sulfate 1 - 4 g bolus IVPB  2 g Intravenous Once  . mouth rinse  15 mL Mouth Rinse 10 times per day  . sodium chloride flush  3 mL Intravenous Q12H  . sodium chloride flush  3 mL Intravenous Q12H  . ticagrelor  90 mg Oral BID   Infusions:  . sodium chloride 10 mL/hr at 08/16/16 0100  . feeding supplement (VITAL HIGH PROTEIN) 1,000 mL (08/17/16 1137)  . fentaNYL infusion INTRAVENOUS 100 mcg/hr (08/17/16 1230)  . insulin (NOVOLIN-R) infusion 5.2 Units/hr (08/17/16 1645)  . norepinephrine 10 mcg/min (08/17/16 1434)  . pureflow 2,000 mL/hr at 08/17/16 [place], PharmD Clinical Pharmacist 08/17/2016 6:16 PM

## 2016-08-17 NOTE — Progress Notes (Signed)
[place] Progress Note Patient Name: Deborah [name] DOB: August 11, 1956 MRN: [id number]   Date of Service  08/17/2016  HPI/Events of Note  Worsening tachypnea, shallow breathing, bipap dependent  eICU Interventions  Now with low soft BP Give 250 ns bolus, over 1 hour, and reassess High risk for intubation.      Intervention Category Major Interventions: Respiratory failure - evaluation and management Intermediate Interventions: Respiratory distress - evaluation and management  Ameir Faria 08/17/2016, 2:09 AM

## 2016-08-17 NOTE — Progress Notes (Signed)
Chaplain rounded the unit to provide a compassionate presence and support to the patient. Chaplain provided hospitality and prayer with the family. [phone]

## 2016-08-17 NOTE — Consult Note (Signed)
PULMONARY / CRITICAL CARE MEDICINE   Name: Deborah [name] MRN: [id number] DOB: 04-29-1956    ADMISSION DATE:  08/15/2016 CONSULTATION DATE:  08/15/2016  REFERRING MD:  Dr. Clayborn Bigness   CHIEF COMPLAINT:  Shortness of breath, tachycardia  HISTORY OF PRESENT ILLNESS:   Deborah [name] is a 60 y.o. Female, with past medical history significant for Type 2 Diabetes Mellitus noncompliant with medications, hypertension, hyperlipidemia, who presented to the emergency room with complaints of chest heaviness and pressure 5-6 out of 10 along with shortness of breath.  Patient's EKG was concerning for acute MI, Troponin-19.28, BNP-2,436. Cardiology was consulted and patient went to Cath Lab and underwent stent placement in the small OM branch of [employer].  Status post stent placement, patient was noted to have shortness of breath, along with hypotension and tachycardia, CXR concerning for fluid overload related to CHF. Patient was diuresed with Lasix, and placed on Bipap, and transferred to ICU.  PCCM was consulted for further management.   9/14 Patient with worsening acidosis, worsening renal failure Failed biPAP therapy, emergently intubated   REVIEW OF SYSTEMS:   Review of Systems  Unable to perform ROS: critical illness  Respiratory: Positive for shortness of breath.      SUBJECTIVE:  Patient denies chest pain or shortness of breath currently.  She states that "she is feeling better".  VITAL SIGNS: BP 92/64   Pulse 91   Temp (!) 100.7 F (38.2 C) (Axillary)   Resp (!) 30   Ht 5' 1"  (1.549 m)   Wt 144 lb 2.9 oz (65.4 kg)   SpO2 95%   BMI 27.24 kg/m   HEMODYNAMICS:    VENTILATOR SETTINGS: FiO2 (%):  [60 %] 60 %  INTAKE / OUTPUT: I/O last 3 completed shifts: In: 925.6 [P.O.:580; I.V.:345.6] Out: 2 [Urine:2]  PHYSICAL EXAMINATION: PHYSICAL EXAMINATION: Physical Examination:   GENERAL:critically ill appearing, +resp distress HEAD: Normocephalic, atraumatic.  EYES: Pupils equal,  round, reactive to light.  No scleral icterus.  MOUTH: Moist mucosal membrane. NECK: Supple. No thyromegaly. No nodules. No JVD. c collar in place PULMONARY: Diffuse coarse rhonchi right sided +wheezes +rhonchi, using accessory muscles to breathe CARDIOVASCULAR: S1 and S2. Regular rate and rhythm. No murmurs, rubs, or gallops.  GASTROINTESTINAL: Soft, nontender, +distended. No masses. Positive bowel sounds. No hepatosplenomegaly.  MUSCULOSKELETAL: No swelling, clubbing, or edema.  NEUROLOGIC: GCS<8T SKIN:intact,warm,dry    LABS:  BMET  Recent Labs Lab 08/15/16 1906 08/16/16 1737 08/17/16 0431  NA 134* 137 138  K 3.0* 4.1 3.1*  CL 106 106 114*  CO2 16* 20* 13*  BUN 19 34* 38*  CREATININE 1.70* 2.87* 3.24*  GLUCOSE 386* 160* 195*    Electrolytes  Recent Labs Lab 08/15/16 1906 08/16/16 1737 08/17/16 0431  CALCIUM 7.9* 8.5* 6.7*  MG  --  2.0  --   PHOS  --  4.3  --     CBC  Recent Labs Lab 08/15/16 1338  WBC 16.3*  HGB 14.8  HCT 44.5  PLT 359    Coag's  Recent Labs Lab 08/15/16 1338  APTT 28  INR 1.06    Sepsis Markers No results for input(s): LATICACIDVEN, PROCALCITON, O2SATVEN in the last 168 hours.  ABG  Recent Labs Lab 08/15/16 1836 08/16/16 0444 08/17/16 0442  PHART 7.26* 7.39 7.28*  PCO2ART 29* 29* 24*  PO2ART 42* 63* 69*    Liver Enzymes  Recent Labs Lab 08/15/16 1338 08/15/16 1906  AST 98* 105*  ALT 28 27  ALKPHOS 101  85  BILITOT 1.4* 1.0  ALBUMIN 3.4* 2.8*    Cardiac Enzymes  Recent Labs Lab 08/16/16 1737 08/16/16 2308 08/17/16 0431  TROPONINI 51.74* 38.24* 26.79*    Glucose  Recent Labs Lab 08/16/16 0339 08/16/16 0722 08/16/16 1137 08/16/16 1617 08/16/16 2129 08/17/16 0222  GLUCAP 235* 207* 170* 141* 153* 192*    Imaging Dg Chest 1 View  Result Date: 08/17/2016 CLINICAL DATA:  Acute respiratory failure. EXAM: CHEST 1 VIEW COMPARISON:  08/15/2016 FINDINGS: Type of the right central line in the mid  SVC. The bilateral perihilar opacities are becoming more confluent, air bronchograms non noted in the right suprahilar region. Stable cardiomegaly. Increasing left lung base atelectasis. Small pleural effusion on prior exam is not well visualized, suspect unchanged. No pneumothorax. IMPRESSION: Coalescing perihilar bilateral opacities, suspicious for increasing pulmonary edema. Increasing left lung base atelectasis. Electronically Signed   By: Jeb Levering M.D.   On: 08/17/2016 03:15     STUDIES:  08/15/2016 cardiac catheterization: distal Cx Lesion 25% stenosed, first obtuse marginal branch first, first marginal lesion 100% stenosed, second obtuse Marginal branch 50% stenosed.  Left ventricle size is normal, EF 60%, no regional wall motion abnormalities.  There is mild (2+) mitral regurgitation.  CULTURES: non  ANTIBIOTICS: non  SIGNIFICANT EVENTS: 9/12/i>>Pt went to cath lab, s/p stent placement procedure, pt was hypotensive, tachycardic, and short of breath.  Pt placed on bipap and low dose levophed, and closely monitored in the ICU. 9/14 intubated #8 ETT  LINES/TUBES: 9/12/>>Right IJ  DISCUSSION: 60 y.o. F presented with STEMI, s/p stent placement, pt developed SOB, hypotension, and tachycardia.  CXR concerning for diastolic CHF exacerbation.    Patient with worsening resp failure and renal failure, cardio-renal syndrome with metabolic acidosis  ASSESSMENT / PLAN:  CARDIOVASCULAR Cardiogenic shock, acidosis -vasopressors as needed keep MAP>65  PULMONARY  Acute Respiratory failure related to CHF exacerbation -Respiratory Failure -continue Full MV support -continue Bronchodilator Therapy -Wean Fio2 and PEEP as tolerated -will perform SAT/SBt when respiratory parameters are met -follow ABG/CXR   RENAL Worsening renal failure Will likely need HD, will consult nephrology  GASTROINTESTINAL Start Tf's OG placed  HEMATOLOGIC Follow CBC  INFECTIOUS No abx at this  this   NEUROLOGIC - intubated and sedated - minimal sedation to achieve a RASS goal: -1  I have personally obtained a history, examined the patient, evaluated Pertinent laboratory and RadioGraphic/imaging results, and  formulated the assessment and plan   The Patient requires high complexity decision making for assessment and support, frequent evaluation and titration of therapies, application of advanced monitoring technologies and extensive interpretation of multiple databases. Critical Care Time devoted to patient care services described in this note is 45 minutes.   Overall, patient is critically ill, prognosis is guarded.  Patient with Multiorgan failure and at high risk for cardiac arrest and death.    Corrin Parker, M.D.  [name]  Medical Director [place] Director [facility] Cardio-Pulmonary Department

## 2016-08-17 NOTE — Progress Notes (Signed)
[place] Progress Note Patient Name: LOVELLE DEITRICK DOB: [birth year] MRN: [id number]   Date of Service  08/17/2016  HPI/Events of Note  Worsening resp acidosis and lethargy  eICU Interventions  Cont with bipap, target Vt =450cc,  Primary team will evaluate for intubation     Intervention Category Major Interventions: Respiratory failure - evaluation and management  Daleon Willinger 08/17/2016, 6:55 AM

## 2016-08-17 NOTE — Progress Notes (Signed)
[place] Progress Note Patient Name: Deborah [name] DOB: [birth year] MRN: [id number]   Date of Service  08/17/2016  HPI/Events of Note  Hypotension, systolics in the 46M  eICU Interventions  Started on low dose levophed Check CXR Straight leg test with 64mmHg increase in sys BP, give another 250cc NS bolus, slowly, and wean levophed as tolerated Cont with bipap.      Intervention Category Intermediate Interventions: Hypovolemia - evaluation and management  Rhylee Nunn 08/17/2016, 2:30 AM

## 2016-08-17 NOTE — Progress Notes (Signed)
BG readings not importing over into results review Blood Glucose as follows readings.  1337: Bg 337 1443: Bg 278 1543: Bg 230 1643: Bg 189 1754: Bg 195 1913: Bg 125 2021: Bg 92 2106: Bg 114

## 2016-08-17 NOTE — Progress Notes (Signed)
Deborah [name] consulted for electrolyte management in this 3 yoF admitted for STEMI, possible CHF exacerbation and poorly controlled diabetes. Patient started on CRRT  1. Electrolytes:  Potassium replaced with iv runs. Magnesium sulfate 2 g iv once. Will f/u PM labs and replace as indicated.   2. No medication adjustments needed for CRRT.   No Known Allergies  Patient Measurements: Height: _0  (154.9 cm) Weight: 149 lb 11.1 oz (67.9 kg) IBW/kg (Calculated) : 47.8   Vital Signs: BP: 102/71 (09/14 1600) Pulse Rate: 92 (09/14 1600) Intake/Output from previous day: 09/13 0701 - 09/14 0700 In: 640.3 [P.O.:580; I.V.:60.3] Out: -  Intake/Output from this shift: Total I/O In: 203.1 [I.V.:53.1; IV Piggyback:150] Out: 150 [Urine:150]  Labs:  Recent Labs  08/15/16 1338 08/17/16 0447  WBC 16.3* 28.6*  HGB 14.8 13.7  HCT 44.5 42.0  PLT 359 300  APTT 28  --   INR 1.06  --      Recent Labs  08/15/16 1338 08/15/16 1906 08/16/16 1737 08/17/16 0431  NA 129* 134* 137 138  K 3.9 3.0* 4.1 3.1*  CL 96* 106 106 114*  CO2 19* 16* 20* 13*  GLUCOSE 583* 386* 160* 195*  BUN 17 19 34* 38*  CREATININE 1.63* 1.70* 2.87* 3.24*  CALCIUM 9.0 7.9* 8.5* 6.7*  MG  --   --  2.0 1.6*  PHOS  --   --  4.3 5.0*  PROT 8.4* 7.3  --   --   ALBUMIN 3.4* 2.8*  --   --   AST 98* 105*  --   --   ALT 28 27  --   --   ALKPHOS 101 85  --   --   BILITOT 1.4* 1.0  --   --   BILIDIR 0.1  --   --   --   IBILI 1.3*  --   --   --    Estimated Creatinine Clearance: 16.5 mL/min (by C-G formula based on SCr of 3.24 mg/dL (H)).    Recent Labs  08/17/16 0222 08/17/16 0736 08/17/16 1132  GLUCAP 192* 284* 291*    Medical History: Past Medical History:  Diagnosis Date  . Diabetes mellitus   . Hyperlipidemia   . Hypertension     Medications:  Scheduled:  . aspirin  81 mg Oral Daily  . atorvastatin  40 mg Oral Daily  . chlorhexidine gluconate (MEDLINE KIT)  15 mL Mouth Rinse BID   . famotidine (PEPCID) IV  20 mg Intravenous Q24H  . heparin subcutaneous  5,000 Units Subcutaneous Q8H  . magnesium sulfate 1 - 4 g bolus IVPB  2 g Intravenous Once  . mouth rinse  15 mL Mouth Rinse 10 times per day  . sodium chloride flush  3 mL Intravenous Q12H  . sodium chloride flush  3 mL Intravenous Q12H  . ticagrelor  90 mg Oral BID   Infusions:  . sodium chloride 10 mL/hr at 08/16/16 0100  . feeding supplement (VITAL HIGH PROTEIN) 1,000 mL (08/17/16 1137)  . fentaNYL infusion INTRAVENOUS 100 mcg/hr (08/17/16 1230)  . insulin (NOVOLIN-R) infusion 5.1 Units/hr (08/17/16 1545)  . norepinephrine 10 mcg/min (08/17/16 1434)  . pureflow 2,000 mL/hr at 08/17/16 [place], PharmD Clinical Pharmacist  08/17/2016 4:41 PM

## 2016-08-17 NOTE — Progress Notes (Signed)
Inpatient Diabetes Program Recommendations  AACE/ADA: New Consensus Statement on Inpatient Glycemic Control (2015)  Target Ranges:  Prepandial:   less than 140 mg/dL      Peak postprandial:   less than 180 mg/dL (1-2 hours)      Critically ill patients:  140 - 180 mg/dL   Results for JEWEL, MCAFEE (MRN [id number]) as of 08/17/2016 07:54  Ref. Range 08/16/2016 03:39 08/16/2016 07:22 08/16/2016 11:37 08/16/2016 16:17 08/16/2016 21:29 08/17/2016 02:22 08/17/2016 07:36  Glucose-Capillary Latest Ref Range: 65 - 99 mg/dL 235 (H) 207 (H) 170 (H) 141 (H) 153 (H) 192 (H) 284 (H)   Review of Glycemic Control  Diabetes history: DM2 Outpatient Diabetes medications: Glipizide 10 mg BID, Metformin 500 mg BID Current orders for Inpatient glycemic control: Novolog 0-15 units TID with meals, Novolog 0-5 units QHS  Inpatient Diabetes Program Recommendations: Correction (SSI): Please consider discontinuing current Novolog orders and use ICU Glycemic Control order set. HgbA1C: A1C in process.  NOTE: In reviewing chart, note patient had to be emergently intubated. Therefore, recommend using ICU Glycemic Control order set.  Thanks, Barnie Alderman, RN, MSN, CDE Diabetes Coordinator Inpatient Diabetes Program [phone] (Team Pager from [place] to [place]) [phone] ([place] office) [phone] [facility] [place] office) [phone] [facility] office)

## 2016-08-17 NOTE — Consult Note (Signed)
[facility]  CONSULT NOTE    Date: 08/17/2016                  Patient Name:  Deborah [name]  MRN: [id number]  DOB: [birth year]  Age / Sex: 60 y.o., female         PCP: Rica Mast, MD                 Service Requesting Consult: Dr. Mortimer Fries                 Reason for Consult: Acute Renal Failure            History of Present Illness: Deborah [name] is a 60 y.o. black female with diabetes mellitus type II, hypertension, hyperlipidemia, coronary artery disease, who was admitted to [facility] on 08/15/2016 for CP STEMI stemi  Patient underwent cardiac catheterization by Dr. Clayborn Bigness on 9/13 with stent to OM, creatinine on admission of 1.63. Now with oliguria and increasing metabolic acidosis. Nephrology consulted for renal replacement.   History taken from daughter, Clarene Critchley   Medications: Outpatient medications: Prescriptions Prior to Admission  Medication Sig Dispense Refill Last Dose  . amLODipine (NORVASC) 10 MG tablet Take 1 tablet (10 mg total) by mouth daily. 90 tablet 0 unknown at unknown  . atorvastatin (LIPITOR) 20 MG tablet Take 1 tablet (20 mg total) by mouth daily. 30 tablet 1 08/14/2016 at unknown  . glipiZIDE (GLUCOTROL) 10 MG tablet TAKE 1 TABLET TWICE A DAYBEFORE A MEAL (Patient taking differently: TAKE 1 TABLET TWICE A DAY BEFORE A MEAL) 180 tablet 1 08/15/2016 at 1245  . metFORMIN (GLUCOPHAGE) 500 MG tablet Take 1 tablet (500 mg total) by mouth 2 (two) times daily. NEED APPT ASAP FOR FURTHER REFILLS. CALL OFFICE SOON 60 tablet 0 08/15/2016 at 1245  . metoprolol (LOPRESSOR) 100 MG tablet Take 1 tablet (100 mg total) by mouth 2 (two) times daily. 180 tablet 0 08/15/2016 at 1245  . ranitidine (ZANTAC) 150 MG tablet TAKE ONE TABLET BY MOUTH TWICE DAILY 180 tablet 1 08/15/2016 at 1245  . valsartan-hydrochlorothiazide (DIOVAN-HCT) 320-25 MG tablet Take 1 tablet by mouth daily. 90 tablet 0 08/15/2016 at 1245  . aspirin EC 81 MG tablet Take 81 mg by  mouth every other day.   unknown at unknown    Current medications: Current Facility-Administered Medications  Medication Dose Route Frequency Provider Last Rate Last Dose  . 0.9 %  sodium chloride infusion   Intravenous Continuous Carrie Mew, MD 10 mL/hr at 08/16/16 0100    . 0.9 %  sodium chloride infusion  250 mL Intravenous PRN Dwayne D Callwood, MD      . 0.9 %  sodium chloride infusion  250 mL Intravenous PRN Flora Lipps, MD      . 0.9 %  sodium chloride infusion  250 mL Intravenous PRN Flora Lipps, MD      . acetaminophen (TYLENOL) tablet 650 mg  650 mg Oral Q4H PRN Flora Lipps, MD      . aspirin chewable tablet 81 mg  81 mg Oral Daily Dwayne D Callwood, MD   81 mg at 08/16/16 0945  . atorvastatin (LIPITOR) tablet 40 mg  40 mg Oral Daily Fritzi Mandes, MD   40 mg at 08/16/16 2218  . chlorhexidine gluconate (MEDLINE KIT) (PERIDEX) 0.12 % solution 15 mL  15 mL Mouth Rinse BID Flora Lipps, MD      . famotidine (PEPCID) tablet 20 mg  20 mg  Oral BID Flora Lipps, MD      . feeding supplement (PRO-STAT SUGAR FREE 64) liquid 30 mL  30 mL Per Tube BID Flora Lipps, MD      . feeding supplement (VITAL HIGH PROTEIN) liquid 1,000 mL  1,000 mL Per Tube Q24H Flora Lipps, MD      . fentaNYL 2581mg in NS 2573m(1017mml) infusion-PREMIX  40 mcg/hr Intravenous Titrated KurFlora LippsD 2.5 mL/hr at 08/17/16 0802 25 mcg/hr at 08/17/16 0802  . heparin injection 5,000 Units  5,000 Units Subcutaneous Q8H KurFlora LippsD   5,000 Units at 08/17/16 0606  . insulin aspart (novoLOG) injection 0-15 Units  0-15 Units Subcutaneous TID WC BinHolley RaringP   8 Units at 08/17/16 0813  . insulin aspart (novoLOG) injection 0-5 Units  0-5 Units Subcutaneous QHS BinHolley RaringP   3 Units at 08/15/16 2347  . ipratropium-albuterol (DUONEB) 0.5-2.5 (3) MG/3ML nebulizer solution 3 mL  3 mL Nebulization Q4H PRN Bincy S Varughese, NP      . LORazepam (ATIVAN) injection 0.5-1 mg  0.5-1 mg Intravenous Q6H PRN Bincy S  Varughese, NP   1 mg at 08/17/16 0120  . MEDLINE mouth rinse  15 mL Mouth Rinse 10 times per day KurFlora LippsD      . morphine 2 MG/ML injection 1 mg  1 mg Intravenous Q4H PRN Bincy S Varughese, NP      . norepinephrine (LEVOPHED) 4mg52m D5W 250mL26mmix infusion  0-40 mcg/min Intravenous Titrated DwaynYolonda Kida15 mL/hr at 08/17/16 0635 4 mcg/min at 08/17/16 0635  . ondansetron (ZOFRAN) injection 4 mg  4 mg Intravenous Q6H PRN DwaynYolonda Kida  4 mg at 08/16/16 0947  . potassium chloride 10 mEq in 100 mL IVPB  10 mEq Intravenous Q1 Hr x 4 Bincy S Varughese, NP   10 mEq at 08/17/16 0855  . sodium chloride flush (NS) 0.9 % injection 3 mL  3 mL Intravenous Q12H Dwayne D Callwood, MD   3 mL at 08/16/16 2224  . sodium chloride flush (NS) 0.9 % injection 3 mL  3 mL Intravenous PRN Dwayne D Callwood, MD      . sodium chloride flush (NS) 0.9 % injection 3 mL  3 mL Intravenous Q12H KuriaFlora Lipps     . sodium chloride flush (NS) 0.9 % injection 3 mL  3 mL Intravenous PRN KuriaFlora Lipps     . ticagrelor (BRILINTA) tablet 90 mg  90 mg Oral BID DwaynYolonda Kida  90 mg at 08/16/16 2218      Allergies: No Known Allergies    Past Medical History: Past Medical History:  Diagnosis Date  . Diabetes mellitus   . Hyperlipidemia   . Hypertension      Past Surgical History: Past Surgical History:  Procedure Laterality Date  . CARDIAC CATHETERIZATION N/A 08/15/2016   Procedure: Left Heart Cath and Coronary Angiography;  Surgeon: DwaynYolonda Kida  [facility];  Service: Cardiovascular;  Laterality: N/A;  . CARDIAC CATHETERIZATION N/A 08/15/2016   Procedure: Coronary Stent Intervention;  Surgeon: DwaynYolonda Kida  [facility];  Service: Cardiovascular;  Laterality: N/A;  . VAGINAL DELIVERY     x 6     Family History: Family History  Problem Relation Age of Onset  . Hypertension Mother   . Diabetes Mother   . Breast cancer  Sister  Social History: Social History   Social History  . Marital status: Widowed    Spouse name: N/A  . Number of children: 6  . Years of education: N/A   Occupational History  . Not on file.   Social History Main Topics  . Smoking status: Never Smoker  . Smokeless tobacco: Never Used  . Alcohol use No  . Drug use: No  . Sexual activity: Not on file   Other Topics Concern  . Not on file   Social History Narrative   Lives in [place]. Has 2 daughtesr, 3 grandkids.     Review of Systems: ROS  Vital Signs: Blood pressure 102/77, pulse 100, temperature (!) 100.7 F (38.2 C), temperature source Axillary, resp. rate 18, height 5' 1"  (1.549 m), weight 67.9 kg (149 lb 11.1 oz), SpO2 97 %.  Weight trends: Filed Weights   08/15/16 1333 08/15/16 1610 08/17/16 0900  Weight: 67.6 kg (149 lb) 65.4 kg (144 lb 2.9 oz) 67.9 kg (149 lb 11.1 oz)    Physical Exam: General: Critically ill  Head: +ETT, +OGT  Eyes: Eyes closed  Neck: RIJ central line  Lungs:  Mechanical ventilation: PRVC FiO2 70%  Heart: tachycardia  Abdomen:  Soft, nontender  Extremities: no peripheral edema.  Neurologic: Intubated, sedated  Skin: No lesions  Access: none     Lab results: Basic Metabolic Panel:  Recent Labs Lab 08/15/16 1906 08/16/16 1737 08/17/16 0431  NA 134* 137 138  K 3.0* 4.1 3.1*  CL 106 106 114*  CO2 16* 20* 13*  GLUCOSE 386* 160* 195*  BUN 19 34* 38*  CREATININE 1.70* 2.87* 3.24*  CALCIUM 7.9* 8.5* 6.7*  MG  --  2.0 1.6*  PHOS  --  4.3  --     Liver Function Tests:  Recent Labs Lab 08/15/16 1338 08/15/16 1906  AST 98* 105*  ALT 28 27  ALKPHOS 101 85  BILITOT 1.4* 1.0  PROT 8.4* 7.3  ALBUMIN 3.4* 2.8*    Recent Labs Lab 08/15/16 1338  LIPASE 29   No results for input(s): AMMONIA in the last 168 hours.  CBC:  Recent Labs Lab 08/15/16 1338 08/17/16 0447  WBC 16.3* 28.6*  HGB 14.8 13.7  HCT 44.5 42.0  MCV 85.9 86.3  PLT 359 PENDING     Cardiac Enzymes:  Recent Labs Lab 08/15/16 1338 08/16/16 1737 08/16/16 2308 08/17/16 0431  TROPONINI 19.28* 51.74* 38.24* 26.79*    BNP: Invalid input(s): POCBNP  CBG:  Recent Labs Lab 08/16/16 1137 08/16/16 1617 08/16/16 2129 08/17/16 0222 08/17/16 0736  GLUCAP 170* 141* 153* 192* 284*    Microbiology: Results for orders placed or performed during the hospital encounter of 08/15/16  MRSA PCR Screening     Status: None   Collection Time: 08/15/16  6:30 PM  Result Value Ref Range Status   MRSA by PCR NEGATIVE NEGATIVE Final    Comment:        The GeneXpert MRSA Assay (FDA approved for NASAL specimens only), is one component of a comprehensive MRSA colonization surveillance program. It is not intended to diagnose MRSA infection nor to guide or monitor treatment for MRSA infections.     Coagulation Studies:  Recent Labs  08/15/16 1338  LABPROT 13.8  INR 1.06    Urinalysis: No results for input(s): COLORURINE, LABSPEC, PHURINE, GLUCOSEU, HGBUR, BILIRUBINUR, KETONESUR, PROTEINUR, UROBILINOGEN, NITRITE, LEUKOCYTESUR in the last 72 hours.  Invalid input(s): APPERANCEUR    Imaging: Dg Chest 1 View  Result Date:  08/17/2016 CLINICAL DATA:  Endotracheal tube placement, OG tube placement EXAM: CHEST 1 VIEW COMPARISON:  Portable chest x-ray of 08/17/2016 FINDINGS: The tip of the endotracheal tube is within the right mainstem bronchus and needs to be withdrawn by approximately 5 cm per OG tube extends below the hemidiaphragm. Bilateral primarily perihilar airspace disease again is noted most consistent with edema or possibly aspiration. A small left pleural effusion is noted. Right central venous line is unchanged. IMPRESSION: 1. Endotracheal tip is within the right mainstem bronchus and needs to be withdrawn by approximately 5 cm. 2. OG tube extends below the hemidiaphragm. 3. Little change in bilateral airspace disease with small left pleural effusion.  Electronically Signed   By: Ivar Drape M.D.   On: 08/17/2016 08:05   Dg Chest 1 View  Result Date: 08/17/2016 CLINICAL DATA:  Acute respiratory failure. EXAM: CHEST 1 VIEW COMPARISON:  08/15/2016 FINDINGS: Type of the right central line in the mid SVC. The bilateral perihilar opacities are becoming more confluent, air bronchograms non noted in the right suprahilar region. Stable cardiomegaly. Increasing left lung base atelectasis. Small pleural effusion on prior exam is not well visualized, suspect unchanged. No pneumothorax. IMPRESSION: Coalescing perihilar bilateral opacities, suspicious for increasing pulmonary edema. Increasing left lung base atelectasis. Electronically Signed   By: Jeb Levering M.D.   On: 08/17/2016 03:15   Dg Chest 1 View  Result Date: 08/15/2016 CLINICAL DATA:  Respiratory failure EXAM: CHEST 1 VIEW COMPARISON:  January 22, 2010 FINDINGS: The mediastinal contour is normal. The heart size is enlarged. The diffuse airspace opacities identified throughout bilateral lungs. There is probably a minimal left pleural effusion. The osseous structures are normal. IMPRESSION: Congestive heart failure. Electronically Signed   By: Abelardo Diesel M.D.   On: 08/15/2016 19:25   Dg Abd 1 View  Result Date: 08/17/2016 CLINICAL DATA:  Endotracheal tube placement, OG tube placement EXAM: ABDOMEN - 1 VIEW COMPARISON:  CT abdomen pelvis of 12/10/2010 FINDINGS: The OG tube tip extends into the body the stomach. The bowel gas pattern is nonspecific. Contrast is noted being excreted by the kidneys and within the urinary bladder secondary to recent CT of the abdomen pelvis. The bowel gas pattern is nonspecific. There may be a small left pleural effusion present. IMPRESSION: OG tube tip extends into the body the stomach. The bowel gas pattern is nonspecific. Electronically Signed   By: Ivar Drape M.D.   On: 08/17/2016 08:09   Dg Chest Port 1 View  Result Date: 08/15/2016 CLINICAL DATA:  Central  line placement EXAM: PORTABLE CHEST 1 VIEW COMPARISON:  August 15, 2016 7:04 p.m. FINDINGS: Right central venous line is identified with distal tip in superior vena cava. There is no pneumothorax. The heart size mildly enlarged. Diffuse airspace opacities identified throughout both lungs. The bones are unchanged. IMPRESSION: Right central venous line distal tip in the superior vena cava. There is no pneumothorax. Pulmonary edema unchanged compared prior exam. Electronically Signed   By: Abelardo Diesel M.D.   On: 08/15/2016 21:10      Assessment & Plan: Ms. LISSET KETCHEM is a 60 y.o. black female with diabetes mellitus type II, hypertension, hyperlipidemia, coronary artery disease, who was admitted to [facility] on 08/15/2016  1. Acute renal failure with metabolic acidosis: on chronic kidney disease stage III with proteinuria, Baseline creatinine of 1.63 on admission. Chronic kidney disease secondary to diabetic nephropathy Acute renal failure secondary to cardiogenic shock, contrast induced nephropathy and ATN. Oliguric.  Requiring renal  replacement therapy - Consult critical care for vascular access - Start CRRT: CVVHD with 4K bath. No Ultrafiltration  2. Acute coronary syndrome: with cardiogenic shock. Status post cardiac catheterization and stent placement.  Hemodynamically unstable.  - Vasopressors: norepinephrine.  - appreciate cardiology input.   3. Diabetes Mellitus type II: with chronic kidney disease. Noninsulin dependent. On metformin as outpatient.  - Continue glucose control - Check hemoglobin A1c  4. Acute Respiratory Failure: requiring mechanical ventilation.  - appreciate pulmonary input.   LOS: [place], [place] 9/14/20179:37 AM

## 2016-08-17 NOTE — Procedures (Signed)
Endotracheal Intubation: Patient required placement of an artificial airway secondary to resp failure.   Consent: Emergent.   Hand washing performed prior to starting the procedure.   Medications administered for sedation prior to procedure: Midazolam 2 mg IV,  Vecuronium 10 mg IV, Fentanyl 100 mcg IV.   Procedure: A time out procedure was called and correct patient, name, & ID confirmed. Needed supplies and equipment were assembled and checked to include ETT, 10 ml syringe, Glidescope, Mac and Miller blades, suction, oxygen and bag mask valve, end tidal CO2 monitor. Patient was positioned to align the mouth and pharynx to facilitate visualization of the glottis.  Heart rate, SpO2 and blood pressure was continuously monitored during the procedure. Pre-oxygenation was conducted prior to intubation and endotracheal tube was placed through the vocal cords into the trachea.  During intubation an assistant applied gentle pressure to the cricoid cartilage.   The artificial airway was placed under direct visualization via glidescope route using a 8.0 ETT on the first attempt.    ETT was secured at 23 cm mark.    Placement was confirmed by auscuitation of lungs with good breath sounds bilaterally and no stomach sounds.  Condensation was noted on endotracheal tube.  Pulse ox 91%.  CO2 detector in place with appropriate color change.   Complications: None .   Operator: Joycelyn Liska.   Chest radiograph ordered and pending.   Comments: OGT placed via glidescope.  Corrin Parker, M.D.  [facility]  Medical Director [place] Director [facility] Cardio-Pulmonary Department

## 2016-08-17 NOTE — Progress Notes (Signed)
Subjective:  Status post short of breath wearing BiPAP and feels better than she did yesterday. Sitting up in bed able to communicate denies any chest pain still has some mild shortness of breath but improved since chest  Objective:  Vital Signs in the last 24 hours: Temp:  [98.3 F (36.8 C)-100.7 F (38.2 C)] 100.7 F (38.2 C) (09/14 0200) Pulse Rate:  [85-118] 85 (09/14 1300) Resp:  [18-41] 18 (09/14 1300) BP: (66-119)/(54-84) 66/58 (09/14 1300) SpO2:  [91 %-99 %] 92 % (09/14 1300) FiO2 (%):  [60 %-80 %] 60 % (09/14 1300) Weight:  [67.9 kg (149 lb 11.1 oz)] 67.9 kg (149 lb 11.1 oz) (09/14 0900)  Intake/Output from previous day: 09/13 0701 - 09/14 0700 In: 640.3 [P.O.:580; I.V.:60.3] Out: -  Intake/Output from this shift: Total I/O In: 203.1 [I.V.:53.1; IV Piggyback:150] Out: -   Physical Exam: General appearance: appears older than stated age Neck: no adenopathy, no carotid bruit, no JVD, supple, symmetrical, trachea midline and thyroid not enlarged, symmetric, no tenderness/mass/nodules Lungs: bilateral rhonchi minimal crackles in the bases no significant wheezing wherein BiPAP Heart: regular rate and rhythm, S1, S2 normal, no murmur, click, rub or gallop and patient has S4 gallop soft systolic murmur Abdomen: soft, non-tender; bowel sounds normal; no masses,  no organomegaly Extremities: extremities normal, atraumatic, no cyanosis or edema Pulses: 2+ and symmetric Skin: Skin color, texture, turgor normal. No rashes or lesions Neurologic: Alert and oriented X 3, normal strength and tone. Normal symmetric reflexes. Normal coordination and gait  Lab Results:  Recent Labs  08/15/16 1338 08/17/16 0447  WBC 16.3* 28.6*  HGB 14.8 13.7  PLT 359 300    Recent Labs  08/16/16 1737 08/17/16 0431  NA 137 138  K 4.1 3.1*  CL 106 114*  CO2 20* 13*  GLUCOSE 160* 195*  BUN 34* 38*  CREATININE 2.87* 3.24*    Recent Labs  08/16/16 2308 08/17/16 0431  TROPONINI 38.24*  26.79*   Hepatic Function Panel  Recent Labs  08/15/16 1338 08/15/16 1906  PROT 8.4* 7.3  ALBUMIN 3.4* 2.8*  AST 98* 105*  ALT 28 27  ALKPHOS 101 85  BILITOT 1.4* 1.0  BILIDIR 0.1  --   IBILI 1.3*  --    No results for input(s): CHOL in the last 72 hours. No results for input(s): PROTIME in the last 72 hours.  Imaging: Imaging results have been reviewed  Cardiac Studies:  Assessment/Plan: Status post STEMI PCI and stent with DES postop day 1 Renal insufficiency acute on chronic Congestive heart failure diastolic dysfunction Hypotension Diabetes  Angina Chest Pain CHF Coronary Artery Disease Coronary Artery Stent Hypotension/Shock Ischemic Heart Disease Shortness of Breath  . PLAN Continue ICU level care Continue BiPAP for history failure including congestive heart failure Recommend nephrology input for acute on chronic renal insufficiency Follow-up troponins post MI Continue Brilinta 90 mg twice a day as well as aspirin status post DES stent Elevated troponins close to 40 post MI continue medical therapy Agree with critical care pulmonary input for history failure heart failure Continue internal medicine treatment for diabetes control Statin therapy post MI with Lipitor 20 mg a day Recommend low-dose beta-blockade therapy post MI Avoid ACE inhibitor because of renal insufficiency at this point Continue inhalers diuretics for heart failure Agree with echocardiogram for assessment of LV function and valvular insufficiency and rule out pericardial effusion     Deborah [name] D. 08/17/2016, 2:15 PM

## 2016-08-18 ENCOUNTER — Encounter (HOSPITAL_COMMUNITY)
Admission: EM | Disposition: A | Discharge status: Rehabilitation Facility | Payer: Self-pay | Source: Other Acute Inpatient Hospital | Attending: Cardiothoracic Surgery

## 2016-08-18 ENCOUNTER — Other Ambulatory Visit: Payer: Self-pay

## 2016-08-18 ENCOUNTER — Inpatient Hospital Stay (HOSPITAL_COMMUNITY): Payer: Medicaid Other

## 2016-08-18 ENCOUNTER — Inpatient Hospital Stay: Payer: Medicaid Other

## 2016-08-18 ENCOUNTER — Inpatient Hospital Stay (HOSPITAL_COMMUNITY)
Admission: EM | Admit: 2016-08-18 | Discharge: 2016-10-11 | DRG: 003 | Disposition: A | Discharge status: Rehabilitation Facility | Payer: Medicaid Other | Source: Other Acute Inpatient Hospital | Attending: Cardiothoracic Surgery | Admitting: Cardiothoracic Surgery

## 2016-08-18 DIAGNOSIS — J95851 Ventilator associated pneumonia: Secondary | ICD-10-CM | POA: Diagnosis not present

## 2016-08-18 DIAGNOSIS — G9341 Metabolic encephalopathy: Secondary | ICD-10-CM | POA: Diagnosis not present

## 2016-08-18 DIAGNOSIS — D72820 Lymphocytosis (symptomatic): Secondary | ICD-10-CM

## 2016-08-18 DIAGNOSIS — J9600 Acute respiratory failure, unspecified whether with hypoxia or hypercapnia: Secondary | ICD-10-CM

## 2016-08-18 DIAGNOSIS — N186 End stage renal disease: Secondary | ICD-10-CM | POA: Diagnosis present

## 2016-08-18 DIAGNOSIS — I480 Paroxysmal atrial fibrillation: Secondary | ICD-10-CM | POA: Diagnosis present

## 2016-08-18 DIAGNOSIS — N170 Acute kidney failure with tubular necrosis: Secondary | ICD-10-CM | POA: Diagnosis not present

## 2016-08-18 DIAGNOSIS — I5033 Acute on chronic diastolic (congestive) heart failure: Secondary | ICD-10-CM | POA: Diagnosis present

## 2016-08-18 DIAGNOSIS — Z8249 Family history of ischemic heart disease and other diseases of the circulatory system: Secondary | ICD-10-CM

## 2016-08-18 DIAGNOSIS — R0602 Shortness of breath: Secondary | ICD-10-CM

## 2016-08-18 DIAGNOSIS — N179 Acute kidney failure, unspecified: Secondary | ICD-10-CM

## 2016-08-18 DIAGNOSIS — I2121 ST elevation (STEMI) myocardial infarction involving left circumflex coronary artery: Secondary | ICD-10-CM

## 2016-08-18 DIAGNOSIS — Z992 Dependence on renal dialysis: Secondary | ICD-10-CM

## 2016-08-18 DIAGNOSIS — J811 Chronic pulmonary edema: Secondary | ICD-10-CM | POA: Diagnosis not present

## 2016-08-18 DIAGNOSIS — I130 Hypertensive heart and chronic kidney disease with heart failure and stage 1 through stage 4 chronic kidney disease, or unspecified chronic kidney disease: Secondary | ICD-10-CM | POA: Diagnosis present

## 2016-08-18 DIAGNOSIS — R509 Fever, unspecified: Secondary | ICD-10-CM

## 2016-08-18 DIAGNOSIS — I2510 Atherosclerotic heart disease of native coronary artery without angina pectoris: Secondary | ICD-10-CM | POA: Diagnosis present

## 2016-08-18 DIAGNOSIS — Q210 Ventricular septal defect: Secondary | ICD-10-CM | POA: Diagnosis not present

## 2016-08-18 DIAGNOSIS — E119 Type 2 diabetes mellitus without complications: Secondary | ICD-10-CM

## 2016-08-18 DIAGNOSIS — Z01810 Encounter for preprocedural cardiovascular examination: Secondary | ICD-10-CM

## 2016-08-18 DIAGNOSIS — L8990 Pressure ulcer of unspecified site, unspecified stage: Secondary | ICD-10-CM | POA: Insufficient documentation

## 2016-08-18 DIAGNOSIS — R570 Cardiogenic shock: Secondary | ICD-10-CM

## 2016-08-18 DIAGNOSIS — R6521 Severe sepsis with septic shock: Secondary | ICD-10-CM | POA: Diagnosis not present

## 2016-08-18 DIAGNOSIS — D638 Anemia in other chronic diseases classified elsewhere: Secondary | ICD-10-CM

## 2016-08-18 DIAGNOSIS — N2581 Secondary hyperparathyroidism of renal origin: Secondary | ICD-10-CM | POA: Diagnosis not present

## 2016-08-18 DIAGNOSIS — I213 ST elevation (STEMI) myocardial infarction of unspecified site: Secondary | ICD-10-CM

## 2016-08-18 DIAGNOSIS — I2119 ST elevation (STEMI) myocardial infarction involving other coronary artery of inferior wall: Principal | ICD-10-CM | POA: Diagnosis present

## 2016-08-18 DIAGNOSIS — J9601 Acute respiratory failure with hypoxia: Secondary | ICD-10-CM

## 2016-08-18 DIAGNOSIS — Z4509 Encounter for adjustment and management of other cardiac device: Secondary | ICD-10-CM

## 2016-08-18 DIAGNOSIS — R490 Dysphonia: Secondary | ICD-10-CM

## 2016-08-18 DIAGNOSIS — E118 Type 2 diabetes mellitus with unspecified complications: Secondary | ICD-10-CM

## 2016-08-18 DIAGNOSIS — D689 Coagulation defect, unspecified: Secondary | ICD-10-CM | POA: Diagnosis not present

## 2016-08-18 DIAGNOSIS — Z9889 Other specified postprocedural states: Secondary | ICD-10-CM

## 2016-08-18 DIAGNOSIS — J9621 Acute and chronic respiratory failure with hypoxia: Secondary | ICD-10-CM

## 2016-08-18 DIAGNOSIS — Z452 Encounter for adjustment and management of vascular access device: Secondary | ICD-10-CM

## 2016-08-18 DIAGNOSIS — Z4659 Encounter for fitting and adjustment of other gastrointestinal appliance and device: Secondary | ICD-10-CM

## 2016-08-18 DIAGNOSIS — R34 Anuria and oliguria: Secondary | ICD-10-CM | POA: Diagnosis present

## 2016-08-18 DIAGNOSIS — I340 Nonrheumatic mitral (valve) insufficiency: Secondary | ICD-10-CM

## 2016-08-18 DIAGNOSIS — Z419 Encounter for procedure for purposes other than remedying health state, unspecified: Secondary | ICD-10-CM

## 2016-08-18 DIAGNOSIS — Z953 Presence of xenogenic heart valve: Secondary | ICD-10-CM

## 2016-08-18 DIAGNOSIS — E872 Acidosis, unspecified: Secondary | ICD-10-CM

## 2016-08-18 DIAGNOSIS — Z9189 Other specified personal risk factors, not elsewhere classified: Secondary | ICD-10-CM

## 2016-08-18 DIAGNOSIS — R197 Diarrhea, unspecified: Secondary | ICD-10-CM | POA: Diagnosis present

## 2016-08-18 DIAGNOSIS — N141 Nephropathy induced by other drugs, medicaments and biological substances: Secondary | ICD-10-CM | POA: Diagnosis not present

## 2016-08-18 DIAGNOSIS — Z7982 Long term (current) use of aspirin: Secondary | ICD-10-CM

## 2016-08-18 DIAGNOSIS — Y848 Other medical procedures as the cause of abnormal reaction of the patient, or of later complication, without mention of misadventure at the time of the procedure: Secondary | ICD-10-CM | POA: Diagnosis not present

## 2016-08-18 DIAGNOSIS — I4892 Unspecified atrial flutter: Secondary | ICD-10-CM | POA: Diagnosis not present

## 2016-08-18 DIAGNOSIS — J939 Pneumothorax, unspecified: Secondary | ICD-10-CM

## 2016-08-18 DIAGNOSIS — N183 Chronic kidney disease, stage 3 unspecified: Secondary | ICD-10-CM

## 2016-08-18 DIAGNOSIS — Z9119 Patient's noncompliance with other medical treatment and regimen: Secondary | ICD-10-CM

## 2016-08-18 DIAGNOSIS — Z430 Encounter for attention to tracheostomy: Secondary | ICD-10-CM

## 2016-08-18 DIAGNOSIS — M6281 Muscle weakness (generalized): Secondary | ICD-10-CM

## 2016-08-18 DIAGNOSIS — J9811 Atelectasis: Secondary | ICD-10-CM

## 2016-08-18 DIAGNOSIS — J189 Pneumonia, unspecified organism: Secondary | ICD-10-CM

## 2016-08-18 DIAGNOSIS — Z79899 Other long term (current) drug therapy: Secondary | ICD-10-CM

## 2016-08-18 DIAGNOSIS — I459 Conduction disorder, unspecified: Secondary | ICD-10-CM | POA: Diagnosis present

## 2016-08-18 DIAGNOSIS — I214 Non-ST elevation (NSTEMI) myocardial infarction: Secondary | ICD-10-CM

## 2016-08-18 DIAGNOSIS — K7200 Acute and subacute hepatic failure without coma: Secondary | ICD-10-CM | POA: Diagnosis not present

## 2016-08-18 DIAGNOSIS — R945 Abnormal results of liver function studies: Secondary | ICD-10-CM

## 2016-08-18 DIAGNOSIS — E1122 Type 2 diabetes mellitus with diabetic chronic kidney disease: Secondary | ICD-10-CM | POA: Diagnosis present

## 2016-08-18 DIAGNOSIS — E876 Hypokalemia: Secondary | ICD-10-CM | POA: Diagnosis not present

## 2016-08-18 DIAGNOSIS — Z9689 Presence of other specified functional implants: Secondary | ICD-10-CM

## 2016-08-18 DIAGNOSIS — R5381 Other malaise: Secondary | ICD-10-CM

## 2016-08-18 DIAGNOSIS — Z952 Presence of prosthetic heart valve: Secondary | ICD-10-CM

## 2016-08-18 DIAGNOSIS — E669 Obesity, unspecified: Secondary | ICD-10-CM | POA: Diagnosis present

## 2016-08-18 DIAGNOSIS — R053 Chronic cough: Secondary | ICD-10-CM

## 2016-08-18 DIAGNOSIS — Z9289 Personal history of other medical treatment: Secondary | ICD-10-CM

## 2016-08-18 DIAGNOSIS — I25111 Atherosclerotic heart disease of native coronary artery with angina pectoris with documented spasm: Secondary | ICD-10-CM

## 2016-08-18 DIAGNOSIS — D6489 Other specified anemias: Secondary | ICD-10-CM | POA: Diagnosis present

## 2016-08-18 DIAGNOSIS — R05 Cough: Secondary | ICD-10-CM

## 2016-08-18 DIAGNOSIS — E1165 Type 2 diabetes mellitus with hyperglycemia: Secondary | ICD-10-CM | POA: Diagnosis present

## 2016-08-18 DIAGNOSIS — I25110 Atherosclerotic heart disease of native coronary artery with unstable angina pectoris: Secondary | ICD-10-CM

## 2016-08-18 DIAGNOSIS — E871 Hypo-osmolality and hyponatremia: Secondary | ICD-10-CM | POA: Diagnosis present

## 2016-08-18 DIAGNOSIS — R740 Nonspecific elevation of levels of transaminase and lactic acid dehydrogenase [LDH]: Secondary | ICD-10-CM | POA: Diagnosis not present

## 2016-08-18 DIAGNOSIS — Z978 Presence of other specified devices: Secondary | ICD-10-CM

## 2016-08-18 DIAGNOSIS — I5020 Unspecified systolic (congestive) heart failure: Secondary | ICD-10-CM

## 2016-08-18 DIAGNOSIS — G8191 Hemiplegia, unspecified affecting right dominant side: Secondary | ICD-10-CM | POA: Diagnosis not present

## 2016-08-18 DIAGNOSIS — R410 Disorientation, unspecified: Secondary | ICD-10-CM

## 2016-08-18 DIAGNOSIS — I472 Ventricular tachycardia: Secondary | ICD-10-CM | POA: Diagnosis not present

## 2016-08-18 DIAGNOSIS — I509 Heart failure, unspecified: Secondary | ICD-10-CM

## 2016-08-18 DIAGNOSIS — K761 Chronic passive congestion of liver: Secondary | ICD-10-CM | POA: Diagnosis present

## 2016-08-18 DIAGNOSIS — I2129 ST elevation (STEMI) myocardial infarction involving other sites: Secondary | ICD-10-CM | POA: Diagnosis present

## 2016-08-18 DIAGNOSIS — D62 Acute posthemorrhagic anemia: Secondary | ICD-10-CM | POA: Diagnosis not present

## 2016-08-18 DIAGNOSIS — J810 Acute pulmonary edema: Secondary | ICD-10-CM

## 2016-08-18 DIAGNOSIS — Z951 Presence of aortocoronary bypass graft: Secondary | ICD-10-CM

## 2016-08-18 DIAGNOSIS — J9690 Respiratory failure, unspecified, unspecified whether with hypoxia or hypercapnia: Secondary | ICD-10-CM

## 2016-08-18 DIAGNOSIS — R7989 Other specified abnormal findings of blood chemistry: Secondary | ICD-10-CM

## 2016-08-18 DIAGNOSIS — E1169 Type 2 diabetes mellitus with other specified complication: Secondary | ICD-10-CM | POA: Diagnosis present

## 2016-08-18 DIAGNOSIS — D691 Qualitative platelet defects: Secondary | ICD-10-CM | POA: Diagnosis present

## 2016-08-18 DIAGNOSIS — E441 Mild protein-calorie malnutrition: Secondary | ICD-10-CM | POA: Diagnosis not present

## 2016-08-18 DIAGNOSIS — I252 Old myocardial infarction: Secondary | ICD-10-CM

## 2016-08-18 DIAGNOSIS — Z09 Encounter for follow-up examination after completed treatment for conditions other than malignant neoplasm: Secondary | ICD-10-CM

## 2016-08-18 DIAGNOSIS — A4102 Sepsis due to Methicillin resistant Staphylococcus aureus: Secondary | ICD-10-CM | POA: Diagnosis not present

## 2016-08-18 DIAGNOSIS — R0682 Tachypnea, not elsewhere classified: Secondary | ICD-10-CM

## 2016-08-18 DIAGNOSIS — I5021 Acute systolic (congestive) heart failure: Secondary | ICD-10-CM

## 2016-08-18 DIAGNOSIS — J153 Pneumonia due to streptococcus, group B: Secondary | ICD-10-CM | POA: Diagnosis not present

## 2016-08-18 DIAGNOSIS — G479 Sleep disorder, unspecified: Secondary | ICD-10-CM

## 2016-08-18 DIAGNOSIS — E785 Hyperlipidemia, unspecified: Secondary | ICD-10-CM | POA: Diagnosis present

## 2016-08-18 DIAGNOSIS — D631 Anemia in chronic kidney disease: Secondary | ICD-10-CM | POA: Diagnosis present

## 2016-08-18 DIAGNOSIS — Z0189 Encounter for other specified special examinations: Secondary | ICD-10-CM

## 2016-08-18 DIAGNOSIS — G4700 Insomnia, unspecified: Secondary | ICD-10-CM | POA: Diagnosis not present

## 2016-08-18 DIAGNOSIS — Z95828 Presence of other vascular implants and grafts: Secondary | ICD-10-CM

## 2016-08-18 DIAGNOSIS — Z794 Long term (current) use of insulin: Secondary | ICD-10-CM

## 2016-08-18 DIAGNOSIS — Z6824 Body mass index (BMI) 24.0-24.9, adult: Secondary | ICD-10-CM

## 2016-08-18 HISTORY — PX: CARDIAC CATHETERIZATION: SHX172

## 2016-08-18 LAB — BLOOD GAS, ARTERIAL
ALLENS TEST (PASS/FAIL): POSITIVE — AB
Acid-Base Excess: 0.9 mmol/L (ref 0.0–2.0)
Bicarbonate: 26.6 mmol/L (ref 20.0–28.0)
FIO2: 0.6
Mechanical Rate: 18
O2 SAT: 94.2 %
PCO2 ART: 46 mmHg (ref 32.0–48.0)
PEEP: 10 cmH2O
Patient temperature: 37
VT: 450 mL
pH, Arterial: 7.37 (ref 7.350–7.450)
pO2, Arterial: 74 mmHg — ABNORMAL LOW (ref 83.0–108.0)

## 2016-08-18 LAB — BASIC METABOLIC PANEL
ANION GAP: 4 — AB (ref 5–15)
ANION GAP: 7 (ref 5–15)
BUN: 34 mg/dL — ABNORMAL HIGH (ref 6–20)
BUN: 31 mg/dL — ABNORMAL HIGH (ref 6–20)
CHLORIDE: 106 mmol/L (ref 101–111)
CHLORIDE: 104 mmol/L (ref 101–111)
CO2: 27 mmol/L (ref 22–32)
CO2: 24 mmol/L (ref 22–32)
CREATININE: 2.09 mg/dL — AB (ref 0.44–1.00)
Calcium: 8.4 mg/dL — ABNORMAL LOW (ref 8.9–10.3)
Calcium: 8 mg/dL — ABNORMAL LOW (ref 8.9–10.3)
Creatinine, Ser: 2.29 mg/dL — ABNORMAL HIGH (ref 0.44–1.00)
GFR calc non Af Amer: 22 mL/min — ABNORMAL LOW (ref >60)
GFR calc non Af Amer: 25 mL/min — ABNORMAL LOW (ref >60)
GFR, EST AFRICAN AMERICAN: 26 mL/min — AB (ref >60)
GFR, EST AFRICAN AMERICAN: 29 mL/min — AB (ref >60)
Glucose, Bld: 203 mg/dL — ABNORMAL HIGH (ref 65–99)
Glucose, Bld: 210 mg/dL — ABNORMAL HIGH (ref 65–99)
POTASSIUM: 4.3 mmol/L (ref 3.5–5.1)
POTASSIUM: 4.3 mmol/L (ref 3.5–5.1)
SODIUM: 137 mmol/L (ref 135–145)
SODIUM: 135 mmol/L (ref 135–145)

## 2016-08-18 LAB — GLUCOSE, CAPILLARY
GLUCOSE-CAPILLARY: 176 mg/dL — AB (ref 65–99)
GLUCOSE-CAPILLARY: 167 mg/dL — AB (ref 65–99)
GLUCOSE-CAPILLARY: 73 mg/dL (ref 65–99)
GLUCOSE-CAPILLARY: 145 mg/dL — AB (ref 65–99)
GLUCOSE-CAPILLARY: 179 mg/dL — AB (ref 65–99)
GLUCOSE-CAPILLARY: 160 mg/dL — AB (ref 65–99)
GLUCOSE-CAPILLARY: 207 mg/dL — AB (ref 65–99)
Glucose-Capillary: 143 mg/dL — ABNORMAL HIGH (ref 65–99)
Glucose-Capillary: 145 mg/dL — ABNORMAL HIGH (ref 65–99)
Glucose-Capillary: 176 mg/dL — ABNORMAL HIGH (ref 65–99)
Glucose-Capillary: 178 mg/dL — ABNORMAL HIGH (ref 65–99)
Glucose-Capillary: 191 mg/dL — ABNORMAL HIGH (ref 65–99)
Glucose-Capillary: 195 mg/dL — ABNORMAL HIGH (ref 65–99)
Glucose-Capillary: 198 mg/dL — ABNORMAL HIGH (ref 65–99)
Glucose-Capillary: 214 mg/dL — ABNORMAL HIGH (ref 65–99)
Glucose-Capillary: 261 mg/dL — ABNORMAL HIGH (ref 65–99)
Glucose-Capillary: 295 mg/dL — ABNORMAL HIGH (ref 65–99)
Glucose-Capillary: 352 mg/dL — ABNORMAL HIGH (ref 65–99)

## 2016-08-18 LAB — POCT I-STAT 3, VENOUS BLOOD GAS (G3P V)
ACID-BASE DEFICIT: 1 mmol/L (ref 0.0–2.0)
ACID-BASE DEFICIT: 1 mmol/L (ref 0.0–2.0)
BICARBONATE: 24.9 mmol/L (ref 20.0–28.0)
BICARBONATE: 25.6 mmol/L (ref 20.0–28.0)
Bicarbonate: 24.9 mmol/L (ref 20.0–28.0)
O2 SAT: 54 %
O2 SAT: 63 %
O2 Saturation: 56 %
PCO2 VEN: 47.7 mmHg (ref 44.0–60.0)
PCO2 VEN: 48.5 mmHg (ref 44.0–60.0)
PH VEN: 7.338 (ref 7.250–7.430)
PH VEN: 7.344 (ref 7.250–7.430)
PO2 VEN: 31 mmHg — AB (ref 32.0–45.0)
PO2 VEN: 35 mmHg (ref 32.0–45.0)
TCO2: 26 mmol/L (ref 0–100)
TCO2: 26 mmol/L (ref 0–100)
TCO2: 27 mmol/L (ref 0–100)
pCO2, Ven: 45.8 mmHg (ref 44.0–60.0)
pH, Ven: 7.319 (ref 7.250–7.430)
pO2, Ven: 30 mmHg — CL (ref 32.0–45.0)

## 2016-08-18 LAB — RENAL FUNCTION PANEL
ALBUMIN: 2.1 g/dL — AB (ref 3.5–5.0)
Albumin: 1.8 g/dL — ABNORMAL LOW (ref 3.5–5.0)
Anion gap: 5 (ref 5–15)
Anion gap: 10 (ref 5–15)
BUN: 32 mg/dL — AB (ref 6–20)
BUN: 39 mg/dL — ABNORMAL HIGH (ref 6–20)
CALCIUM: 8.2 mg/dL — AB (ref 8.9–10.3)
CHLORIDE: 105 mmol/L (ref 101–111)
CHLORIDE: 105 mmol/L (ref 101–111)
CO2: 26 mmol/L (ref 22–32)
CO2: 20 mmol/L — AB (ref 22–32)
CREATININE: 2.31 mg/dL — AB (ref 0.44–1.00)
CREATININE: 2.77 mg/dL — AB (ref 0.44–1.00)
Calcium: 8.1 mg/dL — ABNORMAL LOW (ref 8.9–10.3)
GFR calc non Af Amer: 18 mL/min — ABNORMAL LOW (ref >60)
GFR, EST AFRICAN AMERICAN: 25 mL/min — AB (ref >60)
GFR, EST AFRICAN AMERICAN: 20 mL/min — AB (ref >60)
GFR, EST NON AFRICAN AMERICAN: 22 mL/min — AB (ref >60)
GLUCOSE: 355 mg/dL — AB (ref 65–99)
Glucose, Bld: 200 mg/dL — ABNORMAL HIGH (ref 65–99)
PHOSPHORUS: 2.2 mg/dL — AB (ref 2.5–4.6)
Phosphorus: 2.7 mg/dL (ref 2.5–4.6)
Potassium: 4.3 mmol/L (ref 3.5–5.1)
Potassium: 4.7 mmol/L (ref 3.5–5.1)
SODIUM: 136 mmol/L (ref 135–145)
SODIUM: 135 mmol/L (ref 135–145)

## 2016-08-18 LAB — CBC WITH DIFFERENTIAL/PLATELET
Basophils Absolute: 0 10*3/uL (ref 0.0–0.1)
Basophils Relative: 0 %
EOS ABS: 0 10*3/uL (ref 0.0–0.7)
EOS PCT: 0 %
HCT: 31.5 % — ABNORMAL LOW (ref 36.0–46.0)
Hemoglobin: 10.2 g/dL — ABNORMAL LOW (ref 12.0–15.0)
LYMPHS ABS: 1.2 10*3/uL (ref 0.7–4.0)
LYMPHS PCT: 7 %
MCH: 28 pg (ref 26.0–34.0)
MCHC: 32.4 g/dL (ref 30.0–36.0)
MCV: 86.5 fL (ref 78.0–100.0)
MONO ABS: 1.9 10*3/uL — AB (ref 0.1–1.0)
MONOS PCT: 11 %
Neutro Abs: 14.4 10*3/uL — ABNORMAL HIGH (ref 1.7–7.7)
Neutrophils Relative %: 82 %
PLATELETS: 259 10*3/uL (ref 150–400)
RBC: 3.64 MIL/uL — AB (ref 3.87–5.11)
RDW: 13.8 % (ref 11.5–15.5)
WBC: 17.4 10*3/uL — ABNORMAL HIGH (ref 4.0–10.5)

## 2016-08-18 LAB — POCT I-STAT 3, ART BLOOD GAS (G3+)
ACID-BASE DEFICIT: 7 mmol/L — AB (ref 0.0–2.0)
Bicarbonate: 24.5 mmol/L (ref 20.0–28.0)
Bicarbonate: 19.2 mmol/L — ABNORMAL LOW (ref 20.0–28.0)
O2 SAT: 100 %
O2 Saturation: 97 %
PCO2 ART: 40.4 mmHg (ref 32.0–48.0)
PH ART: 7.39 (ref 7.350–7.450)
PH ART: 7.284 — AB (ref 7.350–7.450)
PO2 ART: 220 mmHg — AB (ref 83.0–108.0)
TCO2: 26 mmol/L (ref 0–100)
TCO2: 20 mmol/L (ref 0–100)
pCO2 arterial: 41.1 mmHg (ref 32.0–48.0)
pO2, Arterial: 107 mmHg (ref 83.0–108.0)

## 2016-08-18 LAB — CARBOXYHEMOGLOBIN
Carboxyhemoglobin: 1.3 % (ref 0.5–1.5)
Methemoglobin: 0.9 % (ref 0.0–1.5)
O2 Saturation: 71.8 %
Total hemoglobin: 10 g/dL — ABNORMAL LOW (ref 12.0–16.0)

## 2016-08-18 LAB — PHOSPHORUS: PHOSPHORUS: 2.1 mg/dL — AB (ref 2.5–4.6)

## 2016-08-18 LAB — HEPATITIS B SURFACE ANTIGEN: Hepatitis B Surface Ag: NEGATIVE

## 2016-08-18 LAB — PLATELET INHIBITION P2Y12: PLATELET FUNCTION P2Y12: 179 [PRU] — AB (ref 194–418)

## 2016-08-18 LAB — MAGNESIUM
MAGNESIUM: 1.9 mg/dL (ref 1.7–2.4)
Magnesium: 1.8 mg/dL (ref 1.7–2.4)

## 2016-08-18 LAB — HEPARIN LEVEL (UNFRACTIONATED): HEPARIN UNFRACTIONATED: 0.75 [IU]/mL — AB (ref 0.30–0.70)

## 2016-08-18 LAB — HEPATITIS B SURFACE ANTIBODY,QUALITATIVE: HEP B S AB: NONREACTIVE

## 2016-08-18 LAB — SURGICAL PCR SCREEN
MRSA, PCR: NEGATIVE
Staphylococcus aureus: NEGATIVE

## 2016-08-18 LAB — ABO/RH: ABO/RH(D): AB POS

## 2016-08-18 LAB — POCT ACTIVATED CLOTTING TIME: Activated Clotting Time: 142 seconds

## 2016-08-18 LAB — HEPATITIS B CORE ANTIBODY, IGM: HEP B C IGM: NEGATIVE

## 2016-08-18 SURGERY — RIGHT HEART CATH

## 2016-08-18 MED ORDER — ORAL CARE MOUTH RINSE
15.0000 mL | OROMUCOSAL | Status: DC
  Administered 2016-08-18 – 2016-08-31 (×120): 15 mL via OROMUCOSAL

## 2016-08-18 MED ORDER — NOREPINEPHRINE BITARTRATE 1 MG/ML IV SOLN
0.0000 ug/min | INTRAVENOUS | Status: DC
  Filled 2016-08-18: qty 4

## 2016-08-18 MED ORDER — HEPARIN (PORCINE) IN NACL 2-0.9 UNIT/ML-% IJ SOLN
INTRAMUSCULAR | Status: DC | PRN
  Administered 2016-08-18: 1000 mL

## 2016-08-18 MED ORDER — MIDAZOLAM HCL 2 MG/2ML IJ SOLN
INTRAMUSCULAR | Status: AC
  Filled 2016-08-18: qty 2

## 2016-08-18 MED ORDER — SODIUM CHLORIDE 0.9% FLUSH: 10.0000 mL | INTRAVENOUS | Status: DC | PRN

## 2016-08-18 MED ORDER — SODIUM CHLORIDE 0.9 % IV SOLN
25.0000 ug/h | INTRAVENOUS | Status: DC
  Administered 2016-08-18: 200 ug/h via INTRAVENOUS
  Administered 2016-08-19: 20 ug/h via INTRAVENOUS
  Administered 2016-08-20: 100 ug/h via INTRAVENOUS
  Administered 2016-08-21: 125 ug/h via INTRAVENOUS
  Filled 2016-08-18 (×5): qty 50

## 2016-08-18 MED ORDER — HEPARIN SODIUM (PORCINE) 1000 UNIT/ML IJ SOLN
INTRAMUSCULAR | Status: DC | PRN
  Administered 2016-08-18: 5000 [IU] via INTRAVENOUS

## 2016-08-18 MED ORDER — SODIUM CHLORIDE 0.9 % IV SOLN
INTRAVENOUS | Status: DC | PRN
  Administered 2016-08-18: 10 mL/h via INTRAVENOUS

## 2016-08-18 MED ORDER — FENTANYL CITRATE (PF) 100 MCG/2ML IJ SOLN: 25.0000 ug | INTRAMUSCULAR | Status: DC | PRN

## 2016-08-18 MED ORDER — HEPARIN SODIUM (PORCINE) 1000 UNIT/ML IJ SOLN
INTRAMUSCULAR | Status: AC
  Filled 2016-08-18: qty 1

## 2016-08-18 MED ORDER — HEPARIN (PORCINE) IN NACL 2-0.9 UNIT/ML-% IJ SOLN
INTRAMUSCULAR | Status: AC
  Filled 2016-08-18: qty 500

## 2016-08-18 MED ORDER — SODIUM BICARBONATE 8.4 % IV SOLN
50.0000 meq | Freq: Once | INTRAVENOUS | Status: AC
  Administered 2016-08-18: 50 meq via INTRAVENOUS
  Filled 2016-08-18: qty 50

## 2016-08-18 MED ORDER — FENTANYL CITRATE (PF) 2500 MCG/50ML IJ SOLN
25.0000 ug/h | Status: DC
  Administered 2016-08-18: 200 ug/h via INTRAVENOUS

## 2016-08-18 MED ORDER — LIDOCAINE HCL (PF) 1 % IJ SOLN
INTRAMUSCULAR | Status: DC | PRN
  Administered 2016-08-18: 10 mL
  Administered 2016-08-18 (×2): 2 mL
  Administered 2016-08-18: 20 mL

## 2016-08-18 MED ORDER — HEPARIN (PORCINE) IN NACL 100-0.45 UNIT/ML-% IJ SOLN
500.0000 [IU]/h | INTRAMUSCULAR | Status: DC
  Administered 2016-08-18: 750 [IU]/h via INTRAVENOUS
  Administered 2016-08-20 – 2016-08-22 (×2): 500 [IU]/h via INTRAVENOUS
  Filled 2016-08-18 (×3): qty 250

## 2016-08-18 MED ORDER — MIDAZOLAM HCL 2 MG/2ML IJ SOLN
INTRAMUSCULAR | Status: DC | PRN
  Administered 2016-08-18: 2 mg via INTRAVENOUS

## 2016-08-18 MED ORDER — FAMOTIDINE IN NACL 20-0.9 MG/50ML-% IV SOLN
20.0000 mg | INTRAVENOUS | Status: DC
  Administered 2016-08-18 – 2016-08-20 (×3): 20 mg via INTRAVENOUS
  Filled 2016-08-18 (×3): qty 50

## 2016-08-18 MED ORDER — DEXTROSE 5 % IV SOLN
INTRAVENOUS | Status: DC | PRN
  Administered 2016-08-18 (×3): 9.4 mL via INTRAVENOUS
  Administered 2016-08-18: 4.7 mL via INTRAVENOUS

## 2016-08-18 MED ORDER — HEPARIN SODIUM (PORCINE) 1000 UNIT/ML DIALYSIS
1000.0000 [IU] | INTRAMUSCULAR | Status: DC | PRN
  Administered 2016-08-18: 2000 [IU] via INTRAVENOUS_CENTRAL
  Filled 2016-08-18: qty 6
  Filled 2016-08-18: qty 3
  Filled 2016-08-18: qty 6

## 2016-08-18 MED ORDER — IOPAMIDOL (ISOVUE-370) INJECTION 76%
INTRAVENOUS | Status: DC | PRN
  Administered 2016-08-18: 10 mL via INTRAVENOUS

## 2016-08-18 MED ORDER — SODIUM CHLORIDE 0.9 % IV SOLN
INTRAVENOUS | Status: DC
  Administered 2016-08-18: 11.4 [IU]/h via INTRAVENOUS
  Administered 2016-08-18: 11.7 [IU]/h via INTRAVENOUS
  Filled 2016-08-18: qty 2.5

## 2016-08-18 MED ORDER — SODIUM CHLORIDE 0.9% FLUSH
3.0000 mL | Freq: Two times a day (BID) | INTRAVENOUS | Status: DC
  Administered 2016-08-18 (×2): 3 mL via INTRAVENOUS

## 2016-08-18 MED ORDER — ASPIRIN EC 81 MG PO TBEC: 81.0000 mg | DELAYED_RELEASE_TABLET | Freq: Every day | ORAL | Status: DC

## 2016-08-18 MED ORDER — CHLORHEXIDINE GLUCONATE 0.12 % MT SOLN
15.0000 mL | Freq: Once | OROMUCOSAL | Status: AC
  Administered 2016-08-18: 15 mL via OROMUCOSAL

## 2016-08-18 MED ORDER — LIDOCAINE HCL (PF) 1 % IJ SOLN
INTRAMUSCULAR | Status: AC
  Filled 2016-08-18: qty 60

## 2016-08-18 MED ORDER — SODIUM CHLORIDE 0.9 % IV SOLN
INTRAVENOUS | Status: DC | PRN
  Administered 2016-08-18: 5.5 [IU]/h via INTRAVENOUS

## 2016-08-18 MED ORDER — FENTANYL CITRATE (PF) 100 MCG/2ML IJ SOLN: 25.0000 ug | Freq: Once | INTRAMUSCULAR | Status: DC

## 2016-08-18 MED ORDER — SODIUM CHLORIDE 0.9 % IV SOLN
10.0000 ug/h | INTRAVENOUS | Status: DC
  Filled 2016-08-18: qty 50

## 2016-08-18 MED ORDER — SODIUM CHLORIDE 0.9 % IV SOLN
250.0000 mL | INTRAVENOUS | Status: DC | PRN
Start: 2016-08-18 — End: 2016-08-19
  Administered 2016-08-19: 250 mL via INTRAVENOUS

## 2016-08-18 MED ORDER — TICAGRELOR 90 MG PO TABS: 90.0000 mg | ORAL_TABLET | Freq: Two times a day (BID) | ORAL | Status: DC

## 2016-08-18 MED ORDER — ACETAMINOPHEN 325 MG PO TABS
650.0000 mg | ORAL_TABLET | ORAL | Status: DC | PRN
  Administered 2016-08-19: 650 mg via ORAL
  Filled 2016-08-18: qty 2

## 2016-08-18 MED ORDER — CHLORHEXIDINE GLUCONATE 0.12% ORAL RINSE (MEDLINE KIT)
15.0000 mL | Freq: Two times a day (BID) | OROMUCOSAL | Status: DC
  Administered 2016-08-18 – 2016-08-31 (×26): 15 mL via OROMUCOSAL

## 2016-08-18 MED ORDER — TICAGRELOR 90 MG PO TABS: 90.0000 mg | ORAL_TABLET | Freq: Two times a day (BID) | ORAL | 6 refills | Status: DC

## 2016-08-18 MED ORDER — MIDAZOLAM HCL 2 MG/2ML IJ SOLN: 1.0000 mg | INTRAMUSCULAR | Status: DC | PRN

## 2016-08-18 MED ORDER — FENTANYL CITRATE (PF) 100 MCG/2ML IJ SOLN
INTRAMUSCULAR | Status: DC | PRN
  Administered 2016-08-18: 50 ug via INTRAVENOUS

## 2016-08-18 MED ORDER — ASPIRIN 81 MG PO CHEW
81.0000 mg | CHEWABLE_TABLET | Freq: Every day | ORAL | Status: DC
  Administered 2016-08-19 – 2016-08-22 (×4): 81 mg via ORAL
  Filled 2016-08-18 (×4): qty 1

## 2016-08-18 MED ORDER — SODIUM CHLORIDE 0.9% FLUSH
10.0000 mL | Freq: Two times a day (BID) | INTRAVENOUS | Status: DC
  Administered 2016-08-18: 10 mL
  Administered 2016-08-19: 30 mL

## 2016-08-18 MED ORDER — MIDAZOLAM HCL 5 MG/ML IJ SOLN
1.0000 mg/h | INTRAMUSCULAR | Status: DC
  Administered 2016-08-18 – 2016-08-19 (×2): 2 mg/h via INTRAVENOUS
  Filled 2016-08-18 (×3): qty 10

## 2016-08-18 MED ORDER — ONDANSETRON HCL 4 MG/2ML IJ SOLN: 4.0000 mg | Freq: Four times a day (QID) | INTRAMUSCULAR | Status: DC | PRN

## 2016-08-18 MED ORDER — DEXTROSE 5 % IV SOLN
0.0000 ug/min | INTRAVENOUS | Status: DC
  Administered 2016-08-18 – 2016-08-19 (×3): 15 ug/min via INTRAVENOUS
  Administered 2016-08-19: 25 ug/min via INTRAVENOUS
  Administered 2016-08-21: 6 ug/min via INTRAVENOUS
  Filled 2016-08-18 (×5): qty 16

## 2016-08-18 MED ORDER — SODIUM CHLORIDE 0.9% FLUSH
3.0000 mL | INTRAVENOUS | Status: DC | PRN
Start: 2016-08-18 — End: 2016-08-19

## 2016-08-18 MED ORDER — TIROFIBAN HCL IN NACL 5-0.9 MG/100ML-% IV SOLN
0.0750 ug/kg/min | INTRAVENOUS | Status: DC
  Administered 2016-08-18 – 2016-08-22 (×6): 0.075 ug/kg/min via INTRAVENOUS
  Filled 2016-08-18 (×6): qty 100

## 2016-08-18 MED ORDER — ATORVASTATIN CALCIUM 20 MG PO TABS
20.0000 mg | ORAL_TABLET | Freq: Every day | ORAL | Status: DC
  Administered 2016-08-18: 20 mg via ORAL
  Filled 2016-08-18: qty 1

## 2016-08-18 SURGICAL SUPPLY — 15 items
BALLN LINEAR 7.5FR IABP 34CC (BALLOONS) ×3
BALLOON LINEAR 7.5FR IABP 34CC (BALLOONS) ×1 IMPLANT
CATH SWAN GANZ VIP 7.5F (CATHETERS) ×3 IMPLANT
DEVICE SECURE STATLOCK IABP (MISCELLANEOUS) ×6 IMPLANT
HOVERMATT SINGLE USE (MISCELLANEOUS) ×3 IMPLANT
PACK CARDIAC CATHETERIZATION (CUSTOM PROCEDURE TRAY) ×3 IMPLANT
PROTECTION STATION PRESSURIZED (MISCELLANEOUS) ×3
SHEATH PINNACLE 5F 10CM (SHEATH) ×6 IMPLANT
SHEATH PINNACLE 8F 10CM (SHEATH) ×3 IMPLANT
SLEEVE REPOSITIONING LENGTH 30 (MISCELLANEOUS) ×3 IMPLANT
STATION PROTECTION PRESSURIZED (MISCELLANEOUS) ×1 IMPLANT
TRANSDUCER W/STOPCOCK (MISCELLANEOUS) ×6 IMPLANT
TUBING ART PRESS 72  MALE/FEM (TUBING) ×2
TUBING ART PRESS 72 MALE/FEM (TUBING) ×1 IMPLANT
WIRE EMERALD 3MM-J .035X150CM (WIRE) ×3 IMPLANT

## 2016-08-18 NOTE — H&P (Signed)
Advanced Heart Failure Team History and Physical Note  Reason for Admission: STEMI with cardiogenic shock   HPI:    60 y/o woman with DM2, HTN, HL and noncompliance presented to [facility] ER with infrerior posterior STEMI on 9/12. Taken to cath lab and found to have occluded small to moderate OM-1 branch. Underwent successful PCS with stent x 2 by Dr. Clayborn Bigness. Coronaries otherwise ok. EF 55-60% with severe MR confirmed by echo.   Post-cath developed respiratory failure and required intubation. Developed shock and renal failure with peak creatinine 3.4 (1.6 on admit). Trialysis catheter placed.   Patient persistently acidotic with concern for ischemic MR and transferred here. En route develop severe hypotension and norepi started.   Bought to cath lab and TEE showed normal LV and RV function with ruptured posterior papillary muscle with severe posterior MR.   Undereent placement of Swan and IABP.   Review of Systems: Unavailable due to patient intubated and sedated.   Home Medications Prior to Admission medications   Medication Sig Start Date End Date Taking? Authorizing Provider  amLODipine (NORVASC) 10 MG tablet Take 1 tablet (10 mg total) by mouth daily. 03/20/16   Coral Spikes, DO  aspirin EC 81 MG tablet Take 81 mg by mouth every other day.    Historical Provider, MD  atorvastatin (LIPITOR) 20 MG tablet Take 1 tablet (20 mg total) by mouth daily. 07/10/14   Jackolyn Confer, MD  ranitidine (ZANTAC) 150 MG tablet TAKE ONE TABLET BY MOUTH TWICE DAILY 05/17/15   Jackolyn Confer, MD  ticagrelor (BRILINTA) 90 MG TABS tablet Take 1 tablet (90 mg total) by mouth 2 (two) times daily. 08/18/16 09/17/16  Yolonda Kida, MD    Past Medical History: Past Medical History:  Diagnosis Date  . Diabetes mellitus   . Hyperlipidemia   . Hypertension     Past Surgical History: Past Surgical History:  Procedure Laterality Date  . CARDIAC CATHETERIZATION N/A 08/15/2016   Procedure: Left  Heart Cath and Coronary Angiography;  Surgeon: Yolonda Kida, MD;  Location: [place] [facility];  Service: Cardiovascular;  Laterality: N/A;  . CARDIAC CATHETERIZATION N/A 08/15/2016   Procedure: Coronary Stent Intervention;  Surgeon: Yolonda Kida, MD;  Location: [place] [facility];  Service: Cardiovascular;  Laterality: N/A;  . VAGINAL DELIVERY     x 6    Family History: Family History  Problem Relation Age of Onset  . Hypertension Mother   . Diabetes Mother   . Breast cancer Sister     Social History: Social History   Social History  . Marital status: Widowed    Spouse name: N/A  . Number of children: 6  . Years of education: N/A   Social History Main Topics  . Smoking status: Never Smoker  . Smokeless tobacco: Never Used  . Alcohol use No  . Drug use: No  . Sexual activity: Not on file   Other Topics Concern  . Not on file   Social History Narrative   Lives in [place]. Has 2 daughtesr, 3 grandkids.    Allergies:  No Known Allergies  Objective:    Vital Signs:   Temp:  [97.5 F (36.4 C)-99.7 F (37.6 C)] 98.7 F (37.1 C) (09/15 0800) Pulse Rate:  [92-107] 106 (09/15 1030) Resp:  [11-24] 18 (09/15 1030) BP: (75-107)/(53-80) 98/65 (09/15 1308) SpO2:  [93 %-100 %] 100 % (09/15 1157) FiO2 (%):  [60 %-100 %] 100 % (09/15 1157) Weight:  [  71.2 kg (156 lb 15.5 oz)] 71.2 kg (156 lb 15.5 oz) (09/15 0500)  Physical Exam: General:  Intubated sedated HEENT: ETT poor dentition  Neck: supple. RIJ TLC. LIJ trialysis Cor: PMI nondisplaced. Regular rate & rhythm. 2/6 MR Lungs: clear Abdomen: soft, nontender, nondistended. No hepatosplenomegaly. No bruits or masses. Good bowel sounds. Extremities: no cyanosis, clubbing, rash, edema Neuro:sedated on vent  Telemetry: Sinus tach 100  Labs: Basic Metabolic Panel:  Recent Labs Lab 08/16/16 1737 08/17/16 0431 08/17/16 1600 08/17/16 1601 08/18/16 0518 08/18/16 0911  NA 137 138 129*  --  137  136  135  K 4.1 3.1* 3.8  --  4.3  4.3 4.3  CL 106 114* 103  --  106  105 104  CO2 20* 13* 20*  --  27  26 24   GLUCOSE 160* 195* 423*  --  203*  200* 210*  BUN 34* 38* 37*  --  34*  32* 31*  CREATININE 2.87* 3.24* 3.38*  --  2.29*  2.31* 2.09*  CALCIUM 8.5* 6.7* 7.6*  --  8.4*  8.2* 8.0*  MG 2.0 1.6*  --  1.7 1.9  --   PHOS 4.3 5.0* 4.0 4.2 2.2*  2.1*  --     Liver Function Tests:  Recent Labs Lab 08/15/16 1338 08/15/16 1906 08/17/16 1600 08/18/16 0518  AST 98* 105*  --   --   ALT 28 27  --   --   ALKPHOS 101 85  --   --   BILITOT 1.4* 1.0  --   --   PROT 8.4* 7.3  --   --   ALBUMIN 3.4* 2.8* 2.3* 2.1*    Recent Labs Lab 08/15/16 1338  LIPASE 29   No results for input(s): AMMONIA in the last 168 hours.  CBC:  Recent Labs Lab 08/15/16 1338 08/17/16 0447 08/17/16 2236  WBC 16.3* 28.6* 26.3*  HGB 14.8 13.7 11.4*  HCT 44.5 42.0 33.7*  MCV 85.9 86.3 85.0  PLT 359 300 253    Cardiac Enzymes:  Recent Labs Lab 08/15/16 1338 08/16/16 1737 08/16/16 2308 08/17/16 0431  TROPONINI 19.28* 51.74* 38.24* 26.79*    BNP: BNP (last 3 results)  Recent Labs  08/15/16 1906  BNP 2,436.0*    ProBNP (last 3 results) No results for input(s): PROBNP in the last 8760 hours.   CBG:  Recent Labs Lab 08/18/16 0619 08/18/16 0726 08/18/16 0829 08/18/16 0933 08/18/16 1038  GLUCAP 191* 145* 143* 160* 198*    Coagulation Studies: No results for input(s): LABPROT, INR in the last 72 hours.  Other results: EKG: NSR 84 inferior ST depression   Imaging: Dg Chest 1 View  Result Date: 08/17/2016 CLINICAL DATA:  Endotracheal tube placement, OG tube placement EXAM: CHEST 1 VIEW COMPARISON:  Portable chest x-ray of 08/17/2016 FINDINGS: The tip of the endotracheal tube is within the right mainstem bronchus and needs to be withdrawn by approximately 5 cm per OG tube extends below the hemidiaphragm. Bilateral primarily perihilar airspace disease again is noted most  consistent with edema or possibly aspiration. A small left pleural effusion is noted. Right central venous line is unchanged. IMPRESSION: 1. Endotracheal tip is within the right mainstem bronchus and needs to be withdrawn by approximately 5 cm. 2. OG tube extends below the hemidiaphragm. 3. Little change in bilateral airspace disease with small left pleural effusion. Electronically Signed   By: Ivar Drape M.D.   On: 08/17/2016 08:05   Dg Chest 1 View  Result Date: 08/17/2016 CLINICAL DATA:  Acute respiratory failure. EXAM: CHEST 1 VIEW COMPARISON:  08/15/2016 FINDINGS: Type of the right central line in the mid SVC. The bilateral perihilar opacities are becoming more confluent, air bronchograms non noted in the right suprahilar region. Stable cardiomegaly. Increasing left lung base atelectasis. Small pleural effusion on prior exam is not well visualized, suspect unchanged. No pneumothorax. IMPRESSION: Coalescing perihilar bilateral opacities, suspicious for increasing pulmonary edema. Increasing left lung base atelectasis. Electronically Signed   By: Jeb Levering M.D.   On: 08/17/2016 03:15   Dg Abd 1 View  Result Date: 08/17/2016 CLINICAL DATA:  Endotracheal tube placement, OG tube placement EXAM: ABDOMEN - 1 VIEW COMPARISON:  CT abdomen pelvis of 12/10/2010 FINDINGS: The OG tube tip extends into the body the stomach. The bowel gas pattern is nonspecific. Contrast is noted being excreted by the kidneys and within the urinary bladder secondary to recent CT of the abdomen pelvis. The bowel gas pattern is nonspecific. There may be a small left pleural effusion present. IMPRESSION: OG tube tip extends into the body the stomach. The bowel gas pattern is nonspecific. Electronically Signed   By: Ivar Drape M.D.   On: 08/17/2016 08:09   Dg Chest Port 1 View  Result Date: 08/18/2016 CLINICAL DATA:  ST-elevation myocardial infarction and cardiogenic shock. EXAM: PORTABLE CHEST 1 VIEW COMPARISON:  Yesterday  FINDINGS: Endotracheal tube tip 1 cm above the carina. Central line on the right and dialysis catheter on the left with tips at the lower SVC. Orogastric tube reaches the stomach at least. Stable borderline heart size. Worsening aeration at the left base with newly obscured diaphragm. Perihilar airspace disease. IMPRESSION: 1. Lower endotracheal tube with tip 1 cm above the carina. 2. New atelectasis or consolidation in the left lower lobe with small effusion. 3. Pulmonary edema. Electronically Signed   By: Monte Fantasia M.D.   On: 08/18/2016 07:02   Dg Chest Port 1 View  Result Date: 08/17/2016 CLINICAL DATA:  Central line placement EXAM: PORTABLE CHEST 1 VIEW COMPARISON:  Earlier same day FINDINGS: Endotracheal tube is been withdrawn with the tip 2.5 cm proximal to the carina. Right internal jugular central line remains in place with its tip in the SVC above the right atrium. New dual-lumen left internal jugular central line has its tip in the SVC above the right atrium. No pneumothorax. Bilateral bat wing airspace filling is seen consistent with acute edema or acute airspace filling due to other etiologies. No effusions. No collapse. IMPRESSION: Left IJ central line well position with tip in the SVC above the right atrium. No pneumothorax. Endotracheal tube repositioned, well positioned with tip 2.5 cm above the carina. Persistent worsen bat wing edema or airspace filling pattern. Electronically Signed   By: Nelson Chimes M.D.   On: 08/17/2016 10:35         Assessment:   1. Cardiogenic shock 2. Inferior posterior STEMI on 9/13 with DES x 2 to OM-1 3. CAD  4. Severe ischemic mitral regurgitation due to reuptured posterior papillary muscle 5. AKI - due to ATN. Trialysis catheter in place 6. Acute respiratory failure 7. DM2   Plan/Discussion:     She is critically ill with cardiogenic shock in setting of inferior STEMI c/b cardiogenic shock, severe ischemic MR due to ruptured papillary  muscle and AKI.   Will support with norepi and IABP. Have consulted Dr. Prescott Gum in [place]. Will also consult Renal and CCM  Family updated.   If  she qualifies for surgery will need to wash out DAPT and start aggrestat.  Continue heparin for IABP.   The patient is critically ill with multiple organ systems failure and requires high complexity decision making for assessment and support, frequent evaluation and titration of therapies, application of advanced monitoring technologies and extensive interpretation of multiple databases.   Critical Care Time devoted to patient care services described in this note is 28 Minutes.    Length of Stay: 0   Glori Bickers MD 08/18/2016, 2:13 PM  Advanced Heart Failure Team Pager [phone] (M-F; [place])  Please contact [facility] for night-coverage after hours (4p -7a ) and weekends on [email or web address]

## 2016-08-18 NOTE — Consult Note (Signed)
PULMONARY / CRITICAL CARE MEDICINE   Name: Deborah [name] MRN: [id number] DOB: 03/04/1956    ADMISSION DATE:  08/15/2016 CONSULTATION DATE:  08/15/2016  REFERRING MD:  Dr. Clayborn Bigness   CHIEF COMPLAINT:  Shortness of breath, tachycardia  HISTORY OF PRESENT ILLNESS:   Deborah [name] is a 60 y.o. Female, with past medical history significant for Type 2 Diabetes Mellitus noncompliant with medications, hypertension, hyperlipidemia, who presented to the emergency room with complaints of chest heaviness and pressure 5-6 out of 10 along with shortness of breath.  Patient's EKG was concerning for acute MI, Troponin-19.28, BNP-2,436. Cardiology was consulted and patient went to Cath Lab and underwent stent placement in the small OM branch of Circumflex.  Status post stent placement, patient was noted to have shortness of breath, along with hypotension and tachycardia, CXR concerning for fluid overload related to CHF. Patient was diuresed with Lasix, and placed on Bipap, and transferred to ICU.  PCCM was consulted for further management.   9/14 Patient with worsening acidosis, worsening renal failure Failed biPAP therapy, emergently intubated 9/14 started CRRT s/p Vasc cath  9/15 remains critically ill, multiorgan failure failure, on CRRT, fio2 65%, PEEP 10   REVIEW OF SYSTEMS:   Review of Systems  Unable to perform ROS: critical illness   VITAL SIGNS: BP 97/71   Pulse (!) 106   Temp 98.7 F (37.1 C) (Oral)   Resp 12   Ht _0  (1.549 m)   Wt 156 lb 15.5 oz (71.2 kg)   SpO2 100%   BMI 29.66 kg/m    VENTILATOR SETTINGS: Vent Mode: PRVC FiO2 (%):  [60 %-70 %] 60 % Set Rate:  [18 bmp] 18 bmp Vt Set:  [450 mL] 450 mL PEEP:  [10 cmH20] 10 cmH20  INTAKE / OUTPUT: I/O last 3 completed shifts: In: 2035 [I.V.:681.4; NG/GT:1203.6; IV Piggyback:150] Out: 165 [Urine:165]   PHYSICAL EXAMINATION: Physical Examination:   GENERAL:critically ill appearing, +resp distress HEAD: Normocephalic,  atraumatic.  EYES: Pupils equal, round, reactive to light.  No scleral icterus.  MOUTH: Moist mucosal membrane. NECK: Supple. No thyromegaly. No nodules. No JVD. c collar in place PULMONARY: Diffuse coarse rhonchi right sided +wheezes +rhonchi, using accessory muscles to breathe CARDIOVASCULAR: S1 and S2. Regular rate and rhythm. No murmurs, rubs, or gallops.  GASTROINTESTINAL: Soft, nontender, +distended. No masses. Positive bowel sounds. No hepatosplenomegaly.  MUSCULOSKELETAL: No swelling, clubbing, or edema.  NEUROLOGIC: GCS<8T SKIN:intact,warm,dry    LABS:  BMET  Recent Labs Lab 08/17/16 0431 08/17/16 1600 08/18/16 0518  NA 138 129* 137  136  K 3.1* 3.8 4.3  4.3  CL 114* 103 106  105  CO2 13* 20* 27  26  BUN 38* 37* 34*  32*  CREATININE 3.24* 3.38* 2.29*  2.31*  GLUCOSE 195* 423* 203*  200*    Electrolytes  Recent Labs Lab 08/17/16 0431 08/17/16 1600 08/17/16 1601 08/18/16 0518  CALCIUM 6.7* 7.6*  --  8.4*  8.2*  MG 1.6*  --  1.7 1.9  PHOS 5.0* 4.0 4.2 2.2*  2.1*    CBC  Recent Labs Lab 08/15/16 1338 08/17/16 0447 08/17/16 2236  WBC 16.3* 28.6* 26.3*  HGB 14.8 13.7 11.4*  HCT 44.5 42.0 33.7*  PLT 359 300 253    Coag's  Recent Labs Lab 08/15/16 1338  APTT 28  INR 1.06    Sepsis Markers No results for input(s): LATICACIDVEN, PROCALCITON, O2SATVEN in the last 168 hours.  ABG  Recent Labs Lab 08/17/16 0442 08/17/16  0900 08/18/16 0422  PHART 7.28* 7.07* 7.37  PCO2ART 24* 35 46  PO2ART 69* 102 74*    Liver Enzymes  Recent Labs Lab 08/15/16 1338 08/15/16 1906 08/17/16 1600 08/18/16 0518  AST 98* 105*  --   --   ALT 28 27  --   --   ALKPHOS 101 85  --   --   BILITOT 1.4* 1.0  --   --   ALBUMIN 3.4* 2.8* 2.3* 2.1*    Cardiac Enzymes  Recent Labs Lab 08/16/16 1737 08/16/16 2308 08/17/16 0431  TROPONINI 51.74* 38.24* 26.79*    Glucose  Recent Labs Lab 08/18/16 0307 08/18/16 0405 08/18/16 0504  08/18/16 0619 08/18/16 0726 08/18/16 0829  GLUCAP 167* 145* 179* 191* 145* 143*    Imaging Dg Chest Port 1 View  Result Date: 08/18/2016 CLINICAL DATA:  ST-elevation myocardial infarction and cardiogenic shock. EXAM: PORTABLE CHEST 1 VIEW COMPARISON:  Yesterday FINDINGS: Endotracheal tube tip 1 cm above the carina. Central line on the right and dialysis catheter on the left with tips at the lower SVC. Orogastric tube reaches the stomach at least. Stable borderline heart size. Worsening aeration at the left base with newly obscured diaphragm. Perihilar airspace disease. IMPRESSION: 1. Lower endotracheal tube with tip 1 cm above the carina. 2. New atelectasis or consolidation in the left lower lobe with small effusion. 3. Pulmonary edema. Electronically Signed   By: Monte Fantasia M.D.   On: 08/18/2016 07:02   Dg Chest Port 1 View  Result Date: 08/17/2016 CLINICAL DATA:  Central line placement EXAM: PORTABLE CHEST 1 VIEW COMPARISON:  Earlier same day FINDINGS: Endotracheal tube is been withdrawn with the tip 2.5 cm proximal to the carina. Right internal jugular central line remains in place with its tip in the SVC above the right atrium. New dual-lumen left internal jugular central line has its tip in the SVC above the right atrium. No pneumothorax. Bilateral bat wing airspace filling is seen consistent with acute edema or acute airspace filling due to other etiologies. No effusions. No collapse. IMPRESSION: Left IJ central line well position with tip in the SVC above the right atrium. No pneumothorax. Endotracheal tube repositioned, well positioned with tip 2.5 cm above the carina. Persistent worsen bat wing edema or airspace filling pattern. Electronically Signed   By: Nelson Chimes M.D.   On: 08/17/2016 10:35     STUDIES:  08/15/2016 cardiac catheterization: distal Cx Lesion 25% stenosed, first obtuse marginal branch first, first marginal lesion 100% stenosed, second obtuse Marginal branch 50%  stenosed.  Left ventricle size is normal, EF 60%, no regional wall motion abnormalities.  There is mild (2+) mitral regurgitation.  CULTURES: non  ANTIBIOTICS: non  SIGNIFICANT EVENTS: 9/12i>>Pt went to cath lab, s/p stent placement procedure, pt was hypotensive, tachycardic, and short of breath.  Pt placed on bipap and low dose levophed, and closely monitored in the ICU. 9/14 intubated #8 ETT, started CRRT  LINES/TUBES: 9/12/>>Right IJ  DISCUSSION: 60 y.o. F presented with STEMI, s/p stent placement, pt developed SOB, hypotension, and tachycardia.  CXR concerning for diastolic CHF exacerbation.    Patient with worsening resp failure and renal failure, cardio-renal syndrome with metabolic acidosis  ASSESSMENT / PLAN:  CARDIOVASCULAR Cardiogenic shock, acidosis -vasopressors as needed keep MAP>65  PULMONARY  Acute Respiratory failure related to CHF exacerbation -Respiratory Failure -continue Full MV support -continue Bronchodilator Therapy -Wean Fio2 and PEEP as tolerated -will perform SAT/SBT when respiratory parameters are met -follow ABG/CXR  RENAL Worsening renal failure Will likely need HD, follow up nephrology consults recs  GASTROINTESTINAL Started Tf's OG placed  HEMATOLOGIC Follow CBC  INFECTIOUS No abx at this this   NEUROLOGIC - intubated and sedated - minimal sedation to achieve a RASS goal: -1  I have personally obtained a history, examined the patient, evaluated Pertinent laboratory and RadioGraphic/imaging results, and  formulated the assessment and plan   The Patient requires high complexity decision making for assessment and support, frequent evaluation and titration of therapies, application of advanced monitoring technologies and extensive interpretation of multiple databases. Critical Care Time devoted to patient care services described in this note is 35 minutes.   Overall, patient is critically ill, prognosis is guarded.  Patient with  Multiorgan failure and at high risk for cardiac arrest and death.    Corrin Parker, M.D.  [facility]  Medical Director [place] Director [facility] Cardio-Pulmonary Department

## 2016-08-18 NOTE — Progress Notes (Signed)
Patient showing signs of restlessness. Patient alert enough to shake her head yes that she is in pain. Increased dose of Fentanyl from 15mcg to 150mcg. Will continue to monitor patient.

## 2016-08-18 NOTE — Progress Notes (Signed)
[place] for Aggrastat Indication: CAD s/p PCI  No Known Allergies  Patient Measurements: Height: 5\' 1"  (154.9 cm) (from 08/15/16 encounter) Weight: 156 lb 15.5 oz (71.2 kg) (from 08/18/16 encounter) IBW/kg (Calculated) : 47.8  Vital Signs: Temp: 99.1 F (37.3 C) (09/15 2006) Temp Source: Core (Comment) (09/15 2000) BP: 124/46 (09/15 2230) Pulse Rate: 108 (09/15 2006)  Labs:  Recent Labs  08/16/16 1737 08/16/16 2308 08/17/16 0431  08/17/16 0447  08/17/16 2236 08/18/16 0518 08/18/16 0911 08/18/16 1500  HGB  --   --   --   < > 13.7  --  11.4*  --   --  10.2*  HCT  --   --   --   --  42.0  --  33.7*  --   --  31.5*  PLT  --   --   --   --  300  --  253  --   --  259  CREATININE 2.87*  --  3.24*  --   --   < >  --  2.29*  2.31* 2.09* 2.77*  TROPONINI 51.74* 38.24* 26.79*  --   --   --   --   --   --   --   < > = values in this interval not displayed.  Estimated Creatinine Clearance: 19.7 mL/min (by C-G formula based on SCr of 2.77 mg/dL (H)).   Assessment: 60 y.o. female s/p STEMI and PCI, now on IABP, awaiting MVR and Brilinta on hold, for Aggrastat  Plan:  Start Aggrastat 0.075 mcg/kg/min  Caryl Pina 08/18/2016,11:25 PM

## 2016-08-18 NOTE — Progress Notes (Signed)
ANTICOAGULATION CONSULT NOTE - Initial Consult  Pharmacy Consult for Heparin Indication: Balloon pump  Allergies  Allergen Reactions  . No Known Allergies     Patient Measurements: 71 kg  Vital Signs: Temp: 98.7 F (37.1 C) (09/15 0800) Temp Source: Oral (09/15 0800) BP: 79/61 (09/15 1450) Pulse Rate: 95 (09/15 1450)  Labs:  Recent Labs  08/16/16 1737 08/16/16 2308 08/17/16 0431 08/17/16 0447 08/17/16 1600 08/17/16 2236 08/18/16 0518 08/18/16 0911  HGB  --   --   --  13.7  --  11.4*  --   --   HCT  --   --   --  42.0  --  33.7*  --   --   PLT  --   --   --  300  --  253  --   --   CREATININE 2.87*  --  3.24*  --  3.38*  --  2.29*  2.31* 2.09*  TROPONINI 51.74* 38.24* 26.79*  --   --   --   --   --     Estimated Creatinine Clearance: 26.2 mL/min (by C-G formula based on SCr of 2.09 mg/dL (H)).   Medical History: Past Medical History:  Diagnosis Date  . Diabetes mellitus   . Hyperlipidemia   . Hypertension     Assessment: 60 year old female s/p STEMI with cardiogenic shock, now with IABP, to begin heparin  Goal of Therapy:  Heparin level 0.2-0.5 units/ml Monitor platelets by anticoagulation protocol: Yes   Plan:  Heparin at 750 units / hr Heparin level 8 hours after heparin starts Daily heparin level, CBC  Thank you Anette Guarneri, PharmD [phone]  08/18/2016,3:00 PM

## 2016-08-18 NOTE — Progress Notes (Signed)
Initial Nutrition Assessment  DOCUMENTATION CODES:   Not applicable  INTERVENTION:   -If pt unable to extubate within 48 hours, recommend:  Initiate TF via OGT with Vital AF 1.2 at goal rate of 55 ml/h (1320 ml per day) to provide 1584 kcals, 99 gm protein, 1070 ml free water daily.  NUTRITION DIAGNOSIS:   Inadequate oral intake related to inability to eat as evidenced by NPO status.  GOAL:   Patient will meet greater than or equal to 90% of their needs  MONITOR:   Vent status, Labs, Weight trends, Skin, I & O's  REASON FOR ASSESSMENT:   Ventilator    ASSESSMENT:   60 y/o woman with DM2, HTN, HL and noncompliance presented to [facility] ER with infrerior posterior STEMI on 9/12. Taken to cath lab and found to have occluded small to moderate OM-1 branch. Underwent successful PCS with stent x 2 by Dr. Clayborn [name]. Coronaries otherwise ok. EF 55-60% with severe MR confirmed by echo.   Pt admitted with STEMI with cardiogenic shock.   Patient is currently intubated on ventilator support MV: 7.9 L/min Temp (24hrs), Avg:99.9 F (37.7 C), Min:98.5 F (36.9 C), Max:100.8 F (38.2 C)  Unable to examine pt at time, as multiple RNs in with pt at this time. No family present to obtain hx.   Pt transferred from [facility]; TF protocol started on 08/17/16 (pt was receiving Vital High Protein formula with goal rate of 65 ml/hr providing 137 g of protein, 1560 kcals, 1310 mL of free water).   Pt on pressors and intra-aortic balloon pump. Awaiting TCTS consult for candidacy of surgery.   Wt hx reviewed. Noted wt gain trend.   Labs reviewed: CBGS: 295-325.   Diet Order:  Diet NPO time specified  Skin:  Reviewed, no issues  Last BM:  08/14/16  Height:   Ht Readings from Last 1 Encounters:  08/18/16 5\' 1"  (1.549 m)    Weight:   Wt Readings from Last 1 Encounters:  08/18/16 156 lb 15.5 oz (71.2 kg)    Ideal Body Weight:  47.7 kg  BMI:  Body mass index is 29.66 kg/m.  Estimated  Nutritional Needs:   Kcal:  1591.3  Protein:  95-110 grams  Fluid:  >1.5 L  EDUCATION NEEDS:   No education needs identified at this time  Deborah [name] A. Jimmye Norman, RD, LDN, CDE Pager: [phone] After hours Pager: [phone]

## 2016-08-18 NOTE — CV Procedure (Signed)
    TRANSESOPHAGEAL ECHOCARDIOGRAM   NAME:  Deborah [name]   MRN: [id number] DOB:  11-Dec-1955   ADMIT DATE: 08/18/2016  INDICATIONS:     PROCEDURE:   Procedure done emergently in cath lab. She was intubated and sedated upon arrival.   After a procedural time-out, the patient was given 2 mg versed and 50 mcg fentanyl for further sedation.The transesophageal probe was inserted in the esophagus and stomach without difficulty and multiple views were obtained.    COMPLICATIONS:    There were no immediate complications.  FINDINGS:  LEFT VENTRICLE: EF = 60-65%. No regional wall motion abnormalities.  RIGHT VENTRICLE: Normal size and function.   LEFT ATRIUM: Dilated  LEFT ATRIAL APPENDAGE: No thrombus.   RIGHT ATRIUM: Normal  AORTIC VALVE:  Trileaflet.  No Ai/AS  MITRAL VALVE:    Ruptured posterior papillary muscle with severe posterior MR.   TRICUSPID VALVE: Normal. Mild TR  PULMONIC VALVE: Grossly normal. Trivial PI  INTERATRIAL SEPTUM: No PFO or ASD.  PERICARDIUM: No effusion   Quadir Muns,MD 4:16 PM

## 2016-08-18 NOTE — Consult Note (Signed)
[address]       [place] [address]             [phone]        Deborah [name] Medical Record #[id number] Date of Birth: Sep 15, 1956  Referring: Candee Furbish, MD Primary Care: Rica Mast, MD   Chief complaint-chest pain shortness of breath  History of Present Illness:    Patient examined, cardiac catheterization from [facility] and echocardiogram from [facility] [place] and right heart cath data all personally reviewed and counseled with patient's family.   60 year old obese AA female  With diabetes, hypertension, chronic renal insufficiency [facility] with history of chest pain and shortness of breath increasing over the past few days. The patient had positive cardiac enzymes and underwent cardiac catheterization. She had severe mitral regurgitation by cath. She had an occluded small OM1 which was opened with PCI and a drug-eluting stent. She was hypotensive with respiratory failure and renal failure and required ICU therapies for cardiogenic shock including intubation, renal replacement therapy dialysis, and norepinephrine. She was transferred to this hospital for further support where TEE demonstrated a ruptured papillary muscle with severe MR. Right heart data shows pulmonary hypertension with compensated cardiac output. TEE demonstrates mild LV dysfunction, no significant TR or aortic valve dysfunction.   The patient has had fever, continued acidosis, continue anuria and continued hypotension requiring 10-20 mcg/m of norepinephrine In addition the patient received a dose of Brilinta earlier today at [facility] and her P2 Y 12 assay shows significant platelet inhibition.  Her hemodynamic status has improved after placement of intrauterine balloon pump by Dr. Haroldine Laws. She is currently sedated on the ventilator and the patient's medical history was reviewed with  her daughters.  Current Activity/ Functional Status: Sedentary lifestyle at home, lives alone with assistance of her family   Zubrod Score: At the time of surgery this patient's most appropriate activity status/level should be described as: []     0    Normal activity, no symptoms []     1    Restricted in physical strenuous activity but ambulatory, able to do out light work []     2    Ambulatory and capable of self care, unable to do work activities, up and about                 more than 50%  Of the time                            []     3    Only limited self care, in bed greater than 50% of waking hours []     4    Completely disabled, no self care, confined to bed or chair [x]     5    Moribund  Intubated sedated on balloon pump   Past Medical History:  Diagnosis Date  . Diabetes mellitus   . Hyperlipidemia   . Hypertension     Past Surgical History:  Procedure Laterality Date  . CARDIAC CATHETERIZATION N/A 08/15/2016   Procedure: Left Heart Cath and Coronary Angiography;  Surgeon: Yolonda Kida, MD;  Location: [facility];  Service: Cardiovascular;  Laterality: N/A;  . CARDIAC CATHETERIZATION N/A 08/15/2016   Procedure: Coronary Stent Intervention;  Surgeon: Yolonda Kida, MD;  Location: [facility];  Service: Cardiovascular;  Laterality: N/A;  .  VAGINAL DELIVERY     x 6    History  Smoking Status  . Never Smoker  Smokeless Tobacco  . Never Used    History  Alcohol Use No    Social History   Social History  . Marital status: Widowed    Spouse name: N/A  . Number of children: 6  . Years of education: N/A   Occupational History  . Not on file.   Social History Main Topics  . Smoking status: Never Smoker  . Smokeless tobacco: Never Used  . Alcohol use No  . Drug use: No  . Sexual activity: Not on file   Other Topics Concern  . Not on file   Social History Narrative   Lives in [place]. Has 2 daughtesr, 3 grandkids.    No Known  Allergies  Current Facility-Administered Medications  Medication Dose Route Frequency Provider Last Rate Last Dose  . 0.9 %  sodium chloride infusion  250 mL Intravenous PRN Satira Mccallum Tillery, PA-C      . acetaminophen (TYLENOL) tablet 650 mg  650 mg Oral Q4H PRN Satira Mccallum Tillery, PA-C      . aspirin chewable tablet 81 mg  81 mg Oral Daily Melburn Popper, [facility]      . atorvastatin (LIPITOR) tablet 20 mg  20 mg Oral Daily Satira Mccallum Tillery, PA-C   20 mg at 08/18/16 1741  . chlorhexidine gluconate (MEDLINE KIT) (PERIDEX) 0.12 % solution 15 mL  15 mL Mouth Rinse BID Erick Colace, NP      . famotidine (PEPCID) IVPB 20 mg premix  20 mg Intravenous Q24H Javier Glazier, MD   20 mg at 08/18/16 1615  . fentaNYL (SUBLIMAZE) 5000 mcg / 100 mL (50 mcg/mL) infusion  25-300 mcg/hr Intravenous Continuous Javier Glazier, MD 4 mL/hr at 08/18/16 1430 200 mcg/hr at 08/18/16 1430  . fentaNYL (SUBLIMAZE) injection 25-50 mcg  25-50 mcg Intravenous Q1H PRN Javier Glazier, MD      . heparin ADULT infusion 100 units/mL (25000 units/280m sodium chloride 0.45%)  750 Units/hr Intravenous Continuous DShaune PascalBensimhon, MD 7.5 mL/hr at 08/18/16 1600 750 Units/hr at 08/18/16 1600  . heparin injection 1,000-6,000 Units  1,000-6,000 Units CRRT PRN JJavier Glazier MD   2,000 Units at 08/18/16 1445  . insulin regular (NOVOLIN R,HUMULIN R) 250 Units in sodium chloride 0.9 % 250 mL (1 Units/mL) infusion   Intravenous Continuous JJavier Glazier MD 10.3 mL/hr at 08/18/16 1831 10.3 Units/hr at 08/18/16 1831  . MEDLINE mouth rinse  15 mL Mouth Rinse 10 times per day PErick Colace NP   15 mL at 08/18/16 1800  . midazolam (VERSED) 50 mg in sodium chloride 0.9 % 50 mL (1 mg/mL) infusion  1-8 mg/hr Intravenous Continuous JJavier Glazier MD 2 mL/hr at 08/18/16 1555 2 mg/hr at 08/18/16 1555  . midazolam (VERSED) injection 1-4 mg  1-4 mg Intravenous Q1H PRN JJavier Glazier MD      . norepinephrine  (LEVOPHED) 16 mg in dextrose 5 % 250 mL (0.064 mg/mL) infusion  0-40 mcg/min Intravenous Titrated AMelburn Popper RPH 15.9 mL/hr at 08/18/16 1800 17 mcg/min at 08/18/16 1800  . ondansetron (ZOFRAN) injection 4 mg  4 mg Intravenous Q6H PRN MSatira MccallumTillery, PA-C      . sodium chloride flush (NS) 0.9 % injection 10-40 mL  10-40 mL Intracatheter Q12H PErick Colace NP      . sodium chloride flush (NS)  0.9 % injection 10-40 mL  10-40 mL Intracatheter PRN Erick Colace, NP      . sodium chloride flush (NS) 0.9 % injection 3 mL  3 mL Intravenous Q12H Satira Mccallum Tillery, PA-C   3 mL at 08/18/16 1500  . sodium chloride flush (NS) 0.9 % injection 3 mL  3 mL Intravenous PRN Shirley Friar, PA-C        Prescriptions Prior to Admission  Medication Sig Dispense Refill Last Dose  . amLODipine (NORVASC) 10 MG tablet Take 1 tablet (10 mg total) by mouth daily. 90 tablet 0 unknown at Unknown time  . aspirin EC 81 MG tablet Take 81 mg by mouth every other day.   unknown at unknown  . atorvastatin (LIPITOR) 20 MG tablet Take 1 tablet (20 mg total) by mouth daily. 30 tablet 1 unknown at Unknown time  . metFORMIN (GLUCOPHAGE) 500 MG tablet Take 500 mg by mouth 2 (two) times daily with a meal.   unknown at Unknown time  . metoprolol (LOPRESSOR) 100 MG tablet Take 100 mg by mouth 2 (two) times daily.   unknown at Unknown time  . ranitidine (ZANTAC) 150 MG tablet TAKE ONE TABLET BY MOUTH TWICE DAILY 180 tablet 1 unknown at Unknown time  . ticagrelor (BRILINTA) 90 MG TABS tablet Take 1 tablet (90 mg total) by mouth 2 (two) times daily. 60 tablet 6 unknown at Unknown time  . valsartan-hydrochlorothiazide (DIOVAN-HCT) 320-25 MG tablet Take 1 tablet by mouth daily.   unknown at Unknown time    Family History  Problem Relation Age of Onset  . Hypertension Mother   . Diabetes Mother   . Breast cancer Sister      Review of Systems:  Patient intubated and unable to provide review of systems. Some  information made available by patient's daughters    Cardiac Review of Systems: Y or N  Chest Pain [  yes  ]  Resting SOB [ yes  ] Exertional SOB  [  ]  Orthopnea [  ]   Pedal Edema [   ]    Palpitations [  ] Syncope  [  ]   Presyncope [   ]  General Review of Systems: [Y] = yes [  ]=no Constitional: recent weight change [  ]; anorexia [  ]; fatigue [  ]; nausea [  ]; night sweats [  ]; fever [  ]; or chills [  ]                                                               Dental: poor dentition[ yes ]; Last Dentist visit:Unknown   Eye : blurred vision [  ]; diplopia [   ]; vision changes [  ];  Amaurosis fugax[  ]; Resp: cough [  ];  wheezing[  ];  hemoptysis[  ]; shortness of breath[ yes ]; paroxysmal nocturnal dyspnea[  ]; dyspnea on exertion[  ]; or orthopnea[  ];  GI:  gallstones[  ], vomiting[  ];  dysphagia[  ]; melena[  ];  hematochezia [  ]; heartburn[  ];   Hx of  Colonoscopy[  ]; GU: kidney stones [  ]; hematuria[  ];   dysuria [  ];  nocturia[  ];  history of     obstruction [  ]; urinary frequency [  ]             Skin: rash, swelling[  ];, hair loss[  ];  peripheral edema[  ];  or itching[  ]; Musculosketetal: myalgias[  ];  joint swelling[  ];  joint erythema[  ];  joint pain[  ];  back pain[  ];  Heme/Lymph: bruising[  ];  bleeding[  ];  anemia[  ];  Neuro: TIA[  ];  headaches[  ];  stroke[  ];  vertigo[  ];  seizures[  ];   paresthesias[  ];  difficulty walking[  ];  Psych:depression[  ]; anxiety[  ];  Endocrine: diabetes[Y ];  thyroid dysfunction[  ];  Immunizations: Flu [  ]; Pneumococcal[  ];  Other:  Physical Exam: BP (!) 111/34   Pulse (!) 105   Temp 99.5 F (37.5 C)   Resp 20   Ht 5' 1"  (1.549 m) Comment: from 08/15/16 encounter  Wt 156 lb 15.5 oz (71.2 kg) Comment: from 08/18/16 encounter  SpO2 99%   BMI 29.66 kg/m       Physical Exam  General: Obese middle-aged AA female intubated and sedated HEENT: Normocephalic pupils equal , dentition poor Neck:  Supple without JVD, adenopathy, or bruit Chest: Scattered rhonchi, symmetrical breath sounds,  no tenderness             or deformity Cardiovascular: Regular rate and rhythm, n 2/6 MR murmur, no gallop, peripheral pulses             palpable in all extremities Abdomen:  Soft, nontender, no palpable mass or organomegaly Extremities: Warm, well-perfused, no clubbing cyanosis edema or tenderness, balloon pump access to right femoral artery              no venous stasis changes of the legs Rectal/GU: Deferred Neuro: Sedated on ventilator but has been noted to move all extremities Skin: Clean and dry without rash or ulceration   Diagnostic Studies & Laboratory data:     Recent Radiology Findings:   Dg Chest 1 View  Result Date: 08/17/2016 CLINICAL DATA:  Endotracheal tube placement, OG tube placement EXAM: CHEST 1 VIEW COMPARISON:  Portable chest x-ray of 08/17/2016 FINDINGS: The tip of the endotracheal tube is within the right mainstem bronchus and needs to be withdrawn by approximately 5 cm per OG tube extends below the hemidiaphragm. Bilateral primarily perihilar airspace disease again is noted most consistent with edema or possibly aspiration. A small left pleural effusion is noted. Right central venous line is unchanged. IMPRESSION: 1. Endotracheal tip is within the right mainstem bronchus and needs to be withdrawn by approximately 5 cm. 2. OG tube extends below the hemidiaphragm. 3. Little change in bilateral airspace disease with small left pleural effusion. Electronically Signed   By: Ivar Drape M.D.   On: 08/17/2016 08:05   Dg Chest 1 View  Result Date: 08/17/2016 CLINICAL DATA:  Acute respiratory failure. EXAM: CHEST 1 VIEW COMPARISON:  08/15/2016 FINDINGS: Type of the right central line in the mid SVC. The bilateral perihilar opacities are becoming more confluent, air bronchograms non noted in the right suprahilar region. Stable cardiomegaly. Increasing left lung base atelectasis. Small  pleural effusion on prior exam is not well visualized, suspect unchanged. No pneumothorax. IMPRESSION: Coalescing perihilar bilateral opacities, suspicious for increasing pulmonary edema. Increasing left lung base atelectasis. Electronically Signed   By: Jeb Levering M.D.   On: 08/17/2016 03:15  Dg Abd 1 View  Result Date: 08/17/2016 CLINICAL DATA:  Endotracheal tube placement, OG tube placement EXAM: ABDOMEN - 1 VIEW COMPARISON:  CT abdomen pelvis of 12/10/2010 FINDINGS: The OG tube tip extends into the body the stomach. The bowel gas pattern is nonspecific. Contrast is noted being excreted by the kidneys and within the urinary bladder secondary to recent CT of the abdomen pelvis. The bowel gas pattern is nonspecific. There may be a small left pleural effusion present. IMPRESSION: OG tube tip extends into the body the stomach. The bowel gas pattern is nonspecific. Electronically Signed   By: Ivar Drape M.D.   On: 08/17/2016 08:09   Dg Chest Port 1 View  Result Date: 08/18/2016 CLINICAL DATA:  Placement of femoral Swan-Ganz catheter and intra-aortic balloon pump catheter. EXAM: PORTABLE CHEST 1 VIEW 4:14 p.m.: COMPARISON:  Portable chest x-ray earlier today 3:37 p.m. and previously. FINDINGS: Femoral Swan-Ganz catheter tip projects at the expected location of the right middle lobe pulmonary artery. Intra-aortic balloon pump catheter tip projects over the proximal descending thoracic aorta. Endotracheal tube tip now projects approximately 2-3 cm above the carina. Right jugular central venous catheter tip projects over the upper SVC. Left jugular central venous catheter tip projects over the lower SVC. Nasogastric tube courses below the diaphragm into the stomach. Improved aeration in the left lower lobe since the most recent examination 45 minutes earlier, though streaky opacities persist. Improved perihilar airspace pulmonary edema in both lungs. No new pulmonary parenchymal abnormalities. IMPRESSION:  1. Swan-Ganz catheter tip projects at the expected location of the right middle lobe pulmonary artery. 2. Intra-aortic balloon pump catheter tip projects over the proximal descending thoracic aorta. 3. Remaining support apparatus satisfactory. 4. Improved aeration in both lungs since the examination performed 45 minutes ago, although streaky left lower lobe atelectasis and/or pneumonia and mild perihilar airspace pulmonary edema persists. 5. No new abnormalities. Electronically Signed   By: Evangeline Dakin M.D.   On: 08/18/2016 16:43   Dg Chest Port 1 View  Result Date: 08/18/2016 CLINICAL DATA:  ST-elevation myocardial infarction and cardiogenic shock. EXAM: PORTABLE CHEST 1 VIEW COMPARISON:  Yesterday FINDINGS: Endotracheal tube tip 1 cm above the carina. Central line on the right and dialysis catheter on the left with tips at the lower SVC. Orogastric tube reaches the stomach at least. Stable borderline heart size. Worsening aeration at the left base with newly obscured diaphragm. Perihilar airspace disease. IMPRESSION: 1. Lower endotracheal tube with tip 1 cm above the carina. 2. New atelectasis or consolidation in the left lower lobe with small effusion. 3. Pulmonary edema. Electronically Signed   By: Monte Fantasia M.D.   On: 08/18/2016 07:02   Dg Chest Port 1 View  Result Date: 08/17/2016 CLINICAL DATA:  Central line placement EXAM: PORTABLE CHEST 1 VIEW COMPARISON:  Earlier same day FINDINGS: Endotracheal tube is been withdrawn with the tip 2.5 cm proximal to the carina. Right internal jugular central line remains in place with its tip in the SVC above the right atrium. New dual-lumen left internal jugular central line has its tip in the SVC above the right atrium. No pneumothorax. Bilateral bat wing airspace filling is seen consistent with acute edema or acute airspace filling due to other etiologies. No effusions. No collapse. IMPRESSION: Left IJ central line well position with tip in the SVC  above the right atrium. No pneumothorax. Endotracheal tube repositioned, well positioned with tip 2.5 cm above the carina. Persistent worsen bat wing edema or airspace filling  pattern. Electronically Signed   By: Nelson Chimes M.D.   On: 08/17/2016 10:35     I have independently reviewed the above radiologic studies.  Recent Lab Findings:       Assessment / Plan:     Acute MI, occlusion of small-to-moderate OM1 with papillary muscle necrosis and severe MR with cardiogenic shock Requiring balloon pump support and norepinephrine  PCI of OM1 with drug-eluting stent, patient has received Brilinta since 9-12 including a dose today  Fever, leukocytosis, acidosis   uncontrolled diabetes with severe hyperglycemia on insulin drip  Situation discussed with patient's family She appears to have adequate biventricular support to survive surgery which would entail mitral valve replacement with a bioprosthetic valve. However her recovery would be at risk for multi organ failure with expected prolonged ventilator dependence, hemodialysis dependence, and risk of wound infection due to her uncontrolled diabetes.  I would not recommend surgery at this time because of her documented significant platelet dysfunction by P2Y12 assay. Blood cultures sent-these will need to be negative prior to surgery. Will assess patient's condition for surgery on a day-to-day basis. The patient's family understands that mitral valve replacement as her only chance for survival and to leave the hospital. Her medical condition needs to be optimized prior to surgery to give her best chance of survival  @ME1 @ 08/18/2016 7:10 PM

## 2016-08-18 NOTE — Consult Note (Addendum)
Reason for Consult: Acute renal failure on chronic kidney disease stage III Referring Physician: Glori Bickers (cardiology)  HPI:  60 year old African-American woman with past medical history significant for type 2 diabetes, hypertension, dyslipidemia and what appears to be chronic kidney disease stage III at baseline (creatinine ~1.6-1.7) who was admitted to [facility] on 08/15/16 for chest pain and found to have ST elevation MI for which he underwent cardiac catheterization with stenting to OM1. Unfortunately thereafter developed worsening renal function (creatinine peaked at 3.4) with metabolic acidosis (pH 2.72 with bicarbonate of 13) and oliguria for which she was seen by nephrology (Dr. Juleen China) and started on CRRT on which she remained between 9/14-9/15 primarily for the management of profound metabolic acidosis. With worsening cardiogenic shock and evidence of severe MR, she was referred to [facility] for specialist care on norepinephrine. She earlier underwent TEE that shows a ruptured papillary muscle with severe posterior MR. She is now on IABP support. Pulmonary capillary wedge pressure reported as 12.  Past Medical History:  Diagnosis Date  . Diabetes mellitus   . Hyperlipidemia   . Hypertension     Past Surgical History:  Procedure Laterality Date  . CARDIAC CATHETERIZATION N/A 08/15/2016   Procedure: Left Heart Cath and Coronary Angiography;  Surgeon: Yolonda Kida, MD;  Location: [place] [facility];  Service: Cardiovascular;  Laterality: N/A;  . CARDIAC CATHETERIZATION N/A 08/15/2016   Procedure: Coronary Stent Intervention;  Surgeon: Yolonda Kida, MD;  Location: [place] [facility];  Service: Cardiovascular;  Laterality: N/A;  . VAGINAL DELIVERY     x 6    Family History  Problem Relation Age of Onset  . Hypertension Mother   . Diabetes Mother   . Breast cancer Sister     Social History:  reports that she has never smoked. She has never  used smokeless tobacco. She reports that she does not drink alcohol or use drugs.  Allergies:  Allergies  Allergen Reactions  . No Known Allergies     Medications: Scheduled:  BMP Latest Ref Rng & Units 08/18/2016 08/18/2016 08/18/2016  Glucose 65 - 99 mg/dL 210(H) 203(H) 200(H)  BUN 6 - 20 mg/dL 31(H) 34(H) 32(H)  Creatinine 0.44 - 1.00 mg/dL 2.09(H) 2.29(H) 2.31(H)  Sodium 135 - 145 mmol/L 135 137 136  Potassium 3.5 - 5.1 mmol/L 4.3 4.3 4.3  Chloride 101 - 111 mmol/L 104 106 105  CO2 22 - 32 mmol/L 24 27 26   Calcium 8.9 - 10.3 mg/dL 8.0(L) 8.4(L) 8.2(L)   CBC Latest Ref Rng & Units 08/17/2016 08/17/2016 08/15/2016  WBC 3.6 - 11.0 K/uL 26.3(H) 28.6(H) 16.3(H)  Hemoglobin 12.0 - 16.0 g/dL 11.4(L) 13.7 14.8  Hematocrit 35.0 - 47.0 % 33.7(L) 42.0 44.5  Platelets 150 - 440 K/uL 253 300 359     Dg Chest 1 View  Result Date: 08/17/2016 CLINICAL DATA:  Endotracheal tube placement, OG tube placement EXAM: CHEST 1 VIEW COMPARISON:  Portable chest x-ray of 08/17/2016 FINDINGS: The tip of the endotracheal tube is within the right mainstem bronchus and needs to be withdrawn by approximately 5 cm per OG tube extends below the hemidiaphragm. Bilateral primarily perihilar airspace disease again is noted most consistent with edema or possibly aspiration. A small left pleural effusion is noted. Right central venous line is unchanged. IMPRESSION: 1. Endotracheal tip is within the right mainstem bronchus and needs to be withdrawn by approximately 5 cm. 2. OG tube extends below the hemidiaphragm. 3. Little change in bilateral  airspace disease with small left pleural effusion. Electronically Signed   By: Ivar Drape M.D.   On: 08/17/2016 08:05   Dg Chest 1 View  Result Date: 08/17/2016 CLINICAL DATA:  Acute respiratory failure. EXAM: CHEST 1 VIEW COMPARISON:  08/15/2016 FINDINGS: Type of the right central line in the mid SVC. The bilateral perihilar opacities are becoming more confluent, air bronchograms non  noted in the right suprahilar region. Stable cardiomegaly. Increasing left lung base atelectasis. Small pleural effusion on prior exam is not well visualized, suspect unchanged. No pneumothorax. IMPRESSION: Coalescing perihilar bilateral opacities, suspicious for increasing pulmonary edema. Increasing left lung base atelectasis. Electronically Signed   By: Jeb Levering M.D.   On: 08/17/2016 03:15   Dg Abd 1 View  Result Date: 08/17/2016 CLINICAL DATA:  Endotracheal tube placement, OG tube placement EXAM: ABDOMEN - 1 VIEW COMPARISON:  CT abdomen pelvis of 12/10/2010 FINDINGS: The OG tube tip extends into the body the stomach. The bowel gas pattern is nonspecific. Contrast is noted being excreted by the kidneys and within the urinary bladder secondary to recent CT of the abdomen pelvis. The bowel gas pattern is nonspecific. There may be a small left pleural effusion present. IMPRESSION: OG tube tip extends into the body the stomach. The bowel gas pattern is nonspecific. Electronically Signed   By: Ivar Drape M.D.   On: 08/17/2016 08:09   Dg Chest Port 1 View  Result Date: 08/18/2016 CLINICAL DATA:  ST-elevation myocardial infarction and cardiogenic shock. EXAM: PORTABLE CHEST 1 VIEW COMPARISON:  Yesterday FINDINGS: Endotracheal tube tip 1 cm above the carina. Central line on the right and dialysis catheter on the left with tips at the lower SVC. Orogastric tube reaches the stomach at least. Stable borderline heart size. Worsening aeration at the left base with newly obscured diaphragm. Perihilar airspace disease. IMPRESSION: 1. Lower endotracheal tube with tip 1 cm above the carina. 2. New atelectasis or consolidation in the left lower lobe with small effusion. 3. Pulmonary edema. Electronically Signed   By: Monte Fantasia M.D.   On: 08/18/2016 07:02   Dg Chest Port 1 View  Result Date: 08/17/2016 CLINICAL DATA:  Central line placement EXAM: PORTABLE CHEST 1 VIEW COMPARISON:  Earlier same day  FINDINGS: Endotracheal tube is been withdrawn with the tip 2.5 cm proximal to the carina. Right internal jugular central line remains in place with its tip in the SVC above the right atrium. New dual-lumen left internal jugular central line has its tip in the SVC above the right atrium. No pneumothorax. Bilateral bat wing airspace filling is seen consistent with acute edema or acute airspace filling due to other etiologies. No effusions. No collapse. IMPRESSION: Left IJ central line well position with tip in the SVC above the right atrium. No pneumothorax. Endotracheal tube repositioned, well positioned with tip 2.5 cm above the carina. Persistent worsen bat wing edema or airspace filling pattern. Electronically Signed   By: Nelson Chimes M.D.   On: 08/17/2016 10:35    Review of Systems  Unable to perform ROS: Intubated   Blood pressure 96/71, pulse 95, resp. rate (!) 22, SpO2 100 %. Physical Exam  Nursing note and vitals reviewed. Constitutional: She appears well-developed and well-nourished.  Intubated, sedated, on IABP  HENT:  Head: Normocephalic and atraumatic.  Intubated, on the ventilator  Eyes: Pupils are equal, round, and reactive to light.  Cardiovascular: Normal rate and regular rhythm.   Murmur heard. Loud/harsh blowing systolic murmur over precordium  Respiratory: Breath  sounds normal. She has no wheezes. She has no rales.  Left IJ temp dialysis catheter  GI: Soft. Bowel sounds are normal. She exhibits no distension. There is no tenderness. There is no rebound.  Musculoskeletal: She exhibits no edema.  RUE PICC line  Neurological:  sedated  Skin: Skin is dry.    Assessment/Plan: 1. Acute renal failure on chronic kidney disease stage III: History and available data points to acute renal failure likely from cardiogenic shock with evolution to ATN and possible contrast-induced nephropathy following emergent coronary angiogram/intervention at [facility]. She is anuric at this time  and from records was on CRRT between 9/14-9/15 at [facility] under the supervision of [place]. At this time, labs appear permissive to leave her off of CRRT overnight (unless acutely needed based on labs or changes to clinical status later tonight). Pulmonary capillary wedge pressure earlier obtained appears acceptable at 12. The cornerstone of management will be the management of her severe MR from papillary muscle rupture. 2. Cardiogenic shock status post acute MI with rupture of posterior papillary muscle: On pressors and intra-aortic balloon pump support at this time. Awaiting input from thoracic surgery regarding candidacy for surgery/repair 3. Acute MI: status post emergent coronary angiogram and PCI with 2 stents to OM1 branch. 4. Anemia: Hemoglobin acceptable for level of chronic kidney disease, will check iron studies  5. VDRF: Secondary to cardiogenic shock, awaiting CCM input 6. Hyponatremia: Mild and secondary to combination of congestive heart failure and acute renal failure with impaired water handling. Will continue to monitor trend.   Chrystal Zeimet K. 08/18/2016, 3:05 PM

## 2016-08-18 NOTE — Progress Notes (Signed)
Nurse made aware that patient to be transported to [facility].  No bed assignment patient going to cath lab.

## 2016-08-18 NOTE — Progress Notes (Signed)
[facility]  ROUNDING NOTE   Subjective:   CRRT UF 0  Norepinephrine 3  Critically ill  Objective:  Vital signs in last 24 hours:  Temp:  [97.5 F (36.4 C)-99.7 F (37.6 C)] 98.7 F (37.1 C) (09/15 0800) Pulse Rate:  [85-118] 106 (09/15 0800) Resp:  [11-24] 12 (09/15 0800) BP: (66-119)/(54-84) 97/71 (09/15 0800) SpO2:  [91 %-100 %] 100 % (09/15 0824) FiO2 (%):  [60 %-70 %] 60 % (09/15 0824) Weight:  [71.2 kg (156 lb 15.5 oz)] 71.2 kg (156 lb 15.5 oz) (09/15 0500)  Weight change:  Filed Weights   08/15/16 1610 08/17/16 0900 08/18/16 0500  Weight: 65.4 kg (144 lb 2.9 oz) 67.9 kg (149 lb 11.1 oz) 71.2 kg (156 lb 15.5 oz)    Intake/Output: I/O last 3 completed shifts: In: 2035 [I.V.:681.4; NG/GT:1203.6; IV Piggyback:150] Out: 165 [Urine:165]   Intake/Output this shift:  No intake/output data recorded.  Physical Exam: General: Critically ill  Head: +ETT, +OGT  Eyes: Eyes open  Neck: RIJ central line  Lungs:  Mechanical ventilation PRVC 60%  Heart: tachycardia  Abdomen:  Soft, nontender  Extremities: no peripheral edema.  Neurologic: Intubated and sedated  Skin: No lesions  Access: Left Ij temp HD catheter Dr. Mortimer Fries 2/33    Basic Metabolic Panel:  Recent Labs Lab 08/15/16 1906 08/16/16 1737 08/17/16 0431 08/17/16 1600 08/17/16 1601 08/18/16 0518  NA 134* 137 138 129*  --  137  136  K 3.0* 4.1 3.1* 3.8  --  4.3  4.3  CL 106 106 114* 103  --  106  105  CO2 16* 20* 13* 20*  --  27  26  GLUCOSE 386* 160* 195* 423*  --  203*  200*  BUN 19 34* 38* 37*  --  34*  32*  CREATININE 1.70* 2.87* 3.24* 3.38*  --  2.29*  2.31*  CALCIUM 7.9* 8.5* 6.7* 7.6*  --  8.4*  8.2*  MG  --  2.0 1.6*  --  1.7 1.9  PHOS  --  4.3 5.0* 4.0 4.2 2.2*  2.1*    Liver Function Tests:  Recent Labs Lab 08/15/16 1338 08/15/16 1906 08/17/16 1600 08/18/16 0518  AST 98* 105*  --   --   ALT 28 27  --   --   ALKPHOS 101 85  --   --   BILITOT 1.4* 1.0  --   --    PROT 8.4* 7.3  --   --   ALBUMIN 3.4* 2.8* 2.3* 2.1*    Recent Labs Lab 08/15/16 1338  LIPASE 29   No results for input(s): AMMONIA in the last 168 hours.  CBC:  Recent Labs Lab 08/15/16 1338 08/17/16 0447 08/17/16 2236  WBC 16.3* 28.6* 26.3*  HGB 14.8 13.7 11.4*  HCT 44.5 42.0 33.7*  MCV 85.9 86.3 85.0  PLT 359 300 253    Cardiac Enzymes:  Recent Labs Lab 08/15/16 1338 08/16/16 1737 08/16/16 2308 08/17/16 0431  TROPONINI 19.28* 51.74* 38.24* 26.79*    BNP: Invalid input(s): POCBNP  CBG:  Recent Labs Lab 08/18/16 0405 08/18/16 0504 08/18/16 0619 08/18/16 0726 08/18/16 0829  GLUCAP 145* 179* 191* 145* 143*    Microbiology: Results for orders placed or performed during the hospital encounter of 08/15/16  MRSA PCR Screening     Status: None   Collection Time: 08/15/16  6:30 PM  Result Value Ref Range Status   MRSA by PCR NEGATIVE NEGATIVE Final    Comment:  The GeneXpert MRSA Assay (FDA approved for NASAL specimens only), is one component of a comprehensive MRSA colonization surveillance program. It is not intended to diagnose MRSA infection nor to guide or monitor treatment for MRSA infections.     Coagulation Studies:  Recent Labs  08/15/16 1338  LABPROT 13.8  INR 1.06    Urinalysis: No results for input(s): COLORURINE, LABSPEC, PHURINE, GLUCOSEU, HGBUR, BILIRUBINUR, KETONESUR, PROTEINUR, UROBILINOGEN, NITRITE, LEUKOCYTESUR in the last 72 hours.  Invalid input(s): APPERANCEUR    Imaging: Dg Chest 1 View  Result Date: 08/17/2016 CLINICAL DATA:  Endotracheal tube placement, OG tube placement EXAM: CHEST 1 VIEW COMPARISON:  Portable chest x-ray of 08/17/2016 FINDINGS: The tip of the endotracheal tube is within the right mainstem bronchus and needs to be withdrawn by approximately 5 cm per OG tube extends below the hemidiaphragm. Bilateral primarily perihilar airspace disease again is noted most consistent with edema or  possibly aspiration. A small left pleural effusion is noted. Right central venous line is unchanged. IMPRESSION: 1. Endotracheal tip is within the right mainstem bronchus and needs to be withdrawn by approximately 5 cm. 2. OG tube extends below the hemidiaphragm. 3. Little change in bilateral airspace disease with small left pleural effusion. Electronically Signed   By: Ivar Drape M.D.   On: 08/17/2016 08:05   Dg Chest 1 View  Result Date: 08/17/2016 CLINICAL DATA:  Acute respiratory failure. EXAM: CHEST 1 VIEW COMPARISON:  08/15/2016 FINDINGS: Type of the right central line in the mid SVC. The bilateral perihilar opacities are becoming more confluent, air bronchograms non noted in the right suprahilar region. Stable cardiomegaly. Increasing left lung base atelectasis. Small pleural effusion on prior exam is not well visualized, suspect unchanged. No pneumothorax. IMPRESSION: Coalescing perihilar bilateral opacities, suspicious for increasing pulmonary edema. Increasing left lung base atelectasis. Electronically Signed   By: Jeb Levering M.D.   On: 08/17/2016 03:15   Dg Abd 1 View  Result Date: 08/17/2016 CLINICAL DATA:  Endotracheal tube placement, OG tube placement EXAM: ABDOMEN - 1 VIEW COMPARISON:  CT abdomen pelvis of 12/10/2010 FINDINGS: The OG tube tip extends into the body the stomach. The bowel gas pattern is nonspecific. Contrast is noted being excreted by the kidneys and within the urinary bladder secondary to recent CT of the abdomen pelvis. The bowel gas pattern is nonspecific. There may be a small left pleural effusion present. IMPRESSION: OG tube tip extends into the body the stomach. The bowel gas pattern is nonspecific. Electronically Signed   By: Ivar Drape M.D.   On: 08/17/2016 08:09   Dg Chest Port 1 View  Result Date: 08/18/2016 CLINICAL DATA:  ST-elevation myocardial infarction and cardiogenic shock. EXAM: PORTABLE CHEST 1 VIEW COMPARISON:  Yesterday FINDINGS: Endotracheal  tube tip 1 cm above the carina. Central line on the right and dialysis catheter on the left with tips at the lower SVC. Orogastric tube reaches the stomach at least. Stable borderline heart size. Worsening aeration at the left base with newly obscured diaphragm. Perihilar airspace disease. IMPRESSION: 1. Lower endotracheal tube with tip 1 cm above the carina. 2. New atelectasis or consolidation in the left lower lobe with small effusion. 3. Pulmonary edema. Electronically Signed   By: Monte Fantasia M.D.   On: 08/18/2016 07:02   Dg Chest Port 1 View  Result Date: 08/17/2016 CLINICAL DATA:  Central line placement EXAM: PORTABLE CHEST 1 VIEW COMPARISON:  Earlier same day FINDINGS: Endotracheal tube is been withdrawn with the tip 2.5 cm proximal  to the carina. Right internal jugular central line remains in place with its tip in the SVC above the right atrium. New dual-lumen left internal jugular central line has its tip in the SVC above the right atrium. No pneumothorax. Bilateral bat wing airspace filling is seen consistent with acute edema or acute airspace filling due to other etiologies. No effusions. No collapse. IMPRESSION: Left IJ central line well position with tip in the SVC above the right atrium. No pneumothorax. Endotracheal tube repositioned, well positioned with tip 2.5 cm above the carina. Persistent worsen bat wing edema or airspace filling pattern. Electronically Signed   By: Nelson Chimes M.D.   On: 08/17/2016 10:35     Medications:   . sodium chloride 10 mL/hr at 08/16/16 0100  . feeding supplement (VITAL HIGH PROTEIN) 1,000 mL (08/17/16 1900)  . fentaNYL infusion INTRAVENOUS 125 mcg/hr (08/18/16 2542)  . insulin (NOVOLIN-R) infusion 2.6 Units/hr (08/18/16 0728)  . norepinephrine (LEVOPHED) Adult infusion 3 mcg/min (08/18/16 0707)  . pureflow 2,000 mL/hr at 08/17/16 1400   . aspirin  81 mg Oral Daily  . atorvastatin  40 mg Oral Daily  . chlorhexidine gluconate (MEDLINE KIT)  15 mL  Mouth Rinse BID  . famotidine (PEPCID) IV  20 mg Intravenous Q24H  . fentaNYL (SUBLIMAZE) injection  25 mcg Intravenous Once  . heparin subcutaneous  5,000 Units Subcutaneous Q8H  . mouth rinse  15 mL Mouth Rinse 10 times per day  . sodium chloride flush  3 mL Intravenous Q12H  . sodium chloride flush  3 mL Intravenous Q12H  . ticagrelor  90 mg Oral BID   sodium chloride, sodium chloride, sodium chloride, acetaminophen, heparin, ipratropium-albuterol, LORazepam, morphine injection, ondansetron (ZOFRAN) IV, sodium chloride, sodium chloride flush, sodium chloride flush  Assessment/ Plan:  [name] is a 60 y.o. black female with diabetes mellitus type II, hypertension, hyperlipidemia, coronary artery disease, who was admitted to [facility] on 08/15/2016  1. Acute renal failure with metabolic acidosis: on chronic kidney disease stage III with proteinuria, Baseline creatinine of 1.63 on admission. Chronic kidney disease secondary to diabetic nephropathy Acute renal failure secondary to cardiogenic shock, contrast induced nephropathy and ATN.  Anuric on vasopressors. Requiring renal replacement therapy - CVVHD with 4K bath. No Ultrafiltration BFR 300 DFR 2.5 - q 6 BMP  2. Acute coronary syndrome: with cardiogenic shock. Status post cardiac catheterization and stent placement.  Hemodynamically unstable.  - Vasopressors: norepinephrine.  - appreciate cardiology input.   3. Diabetes Mellitus type II: with chronic kidney disease. Noninsulin dependent. On metformin as outpatient.  - Continue glucose control - Check hemoglobin A1c  4. Acute Respiratory Failure: requiring mechanical ventilation.  - appreciate pulmonary input.    LOS: [place], Josafat Enrico 9/15/20179:25 AM

## 2016-08-18 NOTE — Discharge Summary (Signed)
Physician Discharge Summary  Patient ID: TERRY ABILA MRN: [id number] DOB/AGE: December 01, 1956 60 y.o.  Admit date: 08/15/2016 Discharge date: 08/18/2016  Admission Diagnoses: STEMI high lateral wall, hyperglycemia dehydration hypertension  Discharge Diagnoses:  Active Problems:   STEMI (ST elevation myocardial infarction) ([place])   Acute respiratory failure ([name])   Respiratory failure ([name])   Cardiogenic shock ([name]) Moderate to severe mitral regurgitation Acute on chronic renal insufficiency Hypotension Diabetes poorly controlled  Discharged Condition: critical  Hospital Course: Patient was admitted with a STEMI sat  at home for several days with midsternal chest discomfort before finally coming to the emergency room on 08/15/2016. She was a late presenting her with a STEMI patient presented at around 1300 hrs. she had worsening chest discomfort since she woke up that morning but pain admitting ongoing for at least 1-2 days. Patient initial troponin was 20. Patient had glucose level around 106 systolic blood pressure was 90 initial creatinine was 1.6. EKG suggested ST elevation 1 mL reciprocal depressions anteriorly patient was taken a cardiac cath (emergent cardiac cath with intention to treat. Patient had PCI and stent with DES to a high OM1 vessel which is relatively small. The patient had TIMI 0 flow and a 2.25 x 15 millimeter xience was placed. Patient had TIMI 3 flow restored LV function appeared to be at least 60% the patient appeared to have moderate to severe. The rest of the coronary vessels appeared to be to have  mild disease. The patient had significant hypotension postprocedure requiring pressor therapy initially with dopamine then levophed She also Got fluid resuscitated with at least 2-3 L of normal saline. Diabetes mellitus treated with insulin therapy patient developed heart failure a few hours after the procedure by chest x-ray bedside echo was performed to rule out pericardial  effusion severe wide open MR. Patient was found to have extensive jet for MI moderate to severe along the posterior wall. No evidence of VSD or ASD was notedin pericardial effusion. Patient was treated with BiPAP and oxygen therapy in ICU and has significant improvement in her symptoms and felt much better. Unfortunately by the next day her renal function began to deteriorate including acidosis and more restricted failure she was subsequently intubated and placed on CRT to help with her renal insufficiency. Peak troponin was 40. No significant evidence of further chest discomfort. Because of the patient's worsening symptoms multisystem failure we decided to transfer to [facility] for evaluation of mitral regurgitation as well as renal insufficiency and a started failure and acidosis. Case was discussed with intensivist as well as family.  Consults: pulmonary/intensive care and Internal medicine hospitalist  Significant Diagnostic Studies: labs: Elevated troponins at presentation abnormal EKG with ST elevation in 1 and L renal insufficiency on presentation of 1.6 rising to about 3, radiology: CXR: cardiomegaly, CHF, pleural effusion: bilaterally and pulmonary edema and angiography: Cardiac catheter initially suggesting totally occluded OM1 infarct related vessel with TIMI 0 flow mild coronary disease elsewhere preserved left ventricular function moderate to severe MR  Treatments: IV hydration, cardiac meds: furosemide and pressors dopamine Levophed, insulin: regular, respiratory therapy: O2, HHFM, albuterol/atropine nebulizer and BiPAP, procedures: internal jugular line and intubation and dialysis: CCPD  Discharge Exam: Blood pressure 91/65, pulse (!) 107, temperature 98.7 F (37.1 C), temperature source Oral, resp. rate 18, height 5\' 1"  (1.549 m), weight 71.2 kg (156 lb 15.5 oz), SpO2 100 %. General appearance: appears older than stated age, no distress, toxic and Intubated sedated Resp: diminished  breath sounds bibasilar  and bilaterally and rhonchi bibasilar and bilaterally Chest wall: no tenderness Cardio: regular rate and rhythm, S1, S2 normal, no murmur, click, rub or gallop and Systolic ejection murmur at the apex positive S4 Extremities: extremities normal, atraumatic, no cyanosis or edema Pulses: 2+ and symmetric Neurologic: Grossly normal  Disposition:   Discharge Instructions    AMB Referral to Cardiac Rehabilitation - Phase II    Complete by:  As directed    Diagnosis:   STEMI Coronary Stents         Medication List    STOP taking these medications   glipiZIDE 10 MG tablet Commonly known as:  GLUCOTROL   metFORMIN 500 MG tablet Commonly known as:  GLUCOPHAGE   metoprolol 100 MG tablet Commonly known as:  LOPRESSOR   valsartan-hydrochlorothiazide 320-25 MG tablet Commonly known as:  DIOVAN-HCT     TAKE these medications   amLODipine 10 MG tablet Commonly known as:  NORVASC Take 1 tablet (10 mg total) by mouth daily.   aspirin EC 81 MG tablet Take 81 mg by mouth every other day.   atorvastatin 20 MG tablet Commonly known as:  LIPITOR Take 1 tablet (20 mg total) by mouth daily.   ranitidine 150 MG tablet Commonly known as:  ZANTAC TAKE ONE TABLET BY MOUTH TWICE DAILY   ticagrelor 90 MG Tabs tablet Commonly known as:  BRILINTA Take 1 tablet (90 mg total) by mouth 2 (two) times daily.        SignedLujean Amel D. 08/18/2016, 10:36 AM

## 2016-08-18 NOTE — Progress Notes (Signed)
Patient in care of [facility] and leaving facility.

## 2016-08-18 NOTE — Progress Notes (Signed)
[facility] arrived and is preparing for transport.

## 2016-08-18 NOTE — Progress Notes (Signed)
Orthopedic Tech Progress Note Patient Details:  Deborah [name] 07-09-1956 [id number]  Ortho Devices Type of Ortho Device: Knee Immobilizer Ortho Device/Splint Interventions: Application   [place] Pink 08/18/2016, 2:45 PM

## 2016-08-18 NOTE — Progress Notes (Signed)
RT Note: Pt arrived to [facility] Cath Lab at this time, placed pt on vent, previous settings that were used at [facility] & in transport reported by care link RN, pt tolerating well, RT will monitor

## 2016-08-18 NOTE — Consult Note (Signed)
PULMONARY / CRITICAL CARE MEDICINE   Name: Deborah [name] MRN: [id number] DOB: 07/14/1956    ADMISSION DATE:  08/18/2016 CONSULTATION DATE:  9/15  REFERRING MD:  Bensimhon   CHIEF COMPLAINT:  Ventilator management and critical care services   HISTORY OF PRESENT ILLNESS:   60 year old African-American woman with past medical history significant for type 2 diabetes, hypertension, dyslipidemia and probable chronic kidney disease stage III at baseline (creatinine ~1.6-1.7) who was admitted to [facility] on 08/15/16 for chest pain and found to have ST elevation MI for which he underwent cardiac catheterization with stenting to OM1. Course complicated by cardiogenic shock, respiratory failure requiring intubation (9/14) &  worsening renal function (creatinine peaked at 3.4) with metabolic acidosis (pH 4.16 with bicarbonate of 13) and oliguria for which she was seen by nephrology (Dr. Juleen China) and started on CRRT on which she remained between 9/14-9/15. She was transferred to [facility] on 9/15 for  worsening cardiogenic shock and evidence of severe MR, she was referred to [facility] for specialist care on norepinephrine. She earlier underwent TEE that shows a ruptured papillary muscle with severe posterior MR. She is now on IABP support. Pulmonary capillary wedge pressure reported as 12. PCCM asked to a/w care   PAST MEDICAL HISTORY :  She  has a past medical history of Diabetes mellitus; Hyperlipidemia; and Hypertension.  PAST SURGICAL HISTORY: She  has a past surgical history that includes Vaginal delivery; Cardiac catheterization (N/A, 08/15/2016); and Cardiac catheterization (N/A, 08/15/2016).  No Active Allergies  No current facility-administered medications on file prior to encounter.    Current Outpatient Prescriptions on File Prior to Encounter  Medication Sig  . amLODipine (NORVASC) 10 MG tablet Take 1 tablet (10 mg total) by mouth daily.  Marland Kitchen aspirin EC 81 MG tablet Take 81  mg by mouth every other day.  Marland Kitchen atorvastatin (LIPITOR) 20 MG tablet Take 1 tablet (20 mg total) by mouth daily.  . ranitidine (ZANTAC) 150 MG tablet TAKE ONE TABLET BY MOUTH TWICE DAILY  . ticagrelor (BRILINTA) 90 MG TABS tablet Take 1 tablet (90 mg total) by mouth 2 (two) times daily.    FAMILY HISTORY:  Her indicated that her mother is deceased. She indicated that the status of her sister is unknown. She indicated that her other is alive.    SOCIAL HISTORY: She  reports that she has never smoked. She has never used smokeless tobacco. She reports that she does not drink alcohol or use drugs.  REVIEW OF SYSTEMS:   Unable  SUBJECTIVE:  Sedated on vent   VITAL SIGNS: BP 98/66   Pulse (!) 103   Resp 19   SpO2 99%   HEMODYNAMICS:    VENTILATOR SETTINGS: Vent Mode: PRVC FiO2 (%):  [60 %-100 %] 100 % Set Rate:  [18 bmp] 18 bmp Vt Set:  [450 mL] 450 mL PEEP:  [10 cmH20] 10 cmH20 Plateau Pressure:  [23 cmH20] 23 cmH20  INTAKE / OUTPUT: No intake/output data recorded.  PHYSICAL EXAMINATION: General:  60 year old female, sedated, and supported on vent, vasoactive gtts and IABP Neuro:  Sedated. Moves all ext  HEENT:  Orally intubated. Right IJ PICC, left IJ HD cath dressings in place  Cardiovascular:  Systolic murmur III?VI, + click from iabp Lungs:  Diffuse rhonchi equal chest rise  Abdomen:  Soft not tender + bowel sounds  Musculoskeletal:  Equal strength right leg immobilized as IABP in place in right fem art Skin:  Cool and dry  LABS:  BMET  Recent Labs Lab 08/17/16 1600 08/18/16 0518 08/18/16 0911  NA 129* 137  136 135  K 3.8 4.3  4.3 4.3  CL 103 106  105 104  CO2 20* 27  26 24   BUN 37* 34*  32* 31*  CREATININE 3.38* 2.29*  2.31* 2.09*  GLUCOSE 423* 203*  200* 210*    Electrolytes  Recent Labs Lab 08/17/16 0431 08/17/16 1600 08/17/16 1601 08/18/16 0518 08/18/16 0911  CALCIUM 6.7* 7.6*  --  8.4*  8.2* 8.0*  MG 1.6*  --  1.7 1.9  --   PHOS  5.0* 4.0 4.2 2.2*  2.1*  --     CBC  Recent Labs Lab 08/15/16 1338 08/17/16 0447 08/17/16 2236  WBC 16.3* 28.6* 26.3*  HGB 14.8 13.7 11.4*  HCT 44.5 42.0 33.7*  PLT 359 300 253    Coag's  Recent Labs Lab 08/15/16 1338  APTT 28  INR 1.06    Sepsis Markers No results for input(s): LATICACIDVEN, PROCALCITON, O2SATVEN in the last 168 hours.  ABG  Recent Labs Lab 08/17/16 0900 08/18/16 0422 08/18/16 1236  PHART 7.07* 7.37 7.390  PCO2ART 35 46 40.4  PO2ART 102 74* 220.0*    Liver Enzymes  Recent Labs Lab 08/15/16 1338 08/15/16 1906 08/17/16 1600 08/18/16 0518  AST 98* 105*  --   --   ALT 28 27  --   --   ALKPHOS 101 85  --   --   BILITOT 1.4* 1.0  --   --   ALBUMIN 3.4* 2.8* 2.3* 2.1*    Cardiac Enzymes  Recent Labs Lab 08/16/16 1737 08/16/16 2308 08/17/16 0431  TROPONINI 51.74* 38.24* 26.79*    Glucose  Recent Labs Lab 08/18/16 0619 08/18/16 0726 08/18/16 0829 08/18/16 0933 08/18/16 1038 08/18/16 1201  GLUCAP 191* 145* 143* 160* 198* 178*    Imaging Dg Chest Port 1 View  Result Date: 08/18/2016 CLINICAL DATA:  ST-elevation myocardial infarction and cardiogenic shock. EXAM: PORTABLE CHEST 1 VIEW COMPARISON:  Yesterday FINDINGS: Endotracheal tube tip 1 cm above the carina. Central line on the right and dialysis catheter on the left with tips at the lower SVC. Orogastric tube reaches the stomach at least. Stable borderline heart size. Worsening aeration at the left base with newly obscured diaphragm. Perihilar airspace disease. IMPRESSION: 1. Lower endotracheal tube with tip 1 cm above the carina. 2. New atelectasis or consolidation in the left lower lobe with small effusion. 3. Pulmonary edema. Electronically Signed   By: Monte Fantasia M.D.   On: 08/18/2016 07:02   pcxr bilateral edema, improving aeration   STUDIES:  9/12. Taken to cath lab and found to have occluded small to moderate OM-1 branch. Underwent successful PCS with stent x  2 by Dr. Clayborn Bigness. Coronaries otherwise ok. EF 55-60% with severe MR confirmed by echo. 9/15: cath lab and TEE showed normal LV and RV function with ruptured posterior papillary muscle with severe posterior MR.   CULTURES:   ANTIBIOTICS:   SIGNIFICANT EVENTS:   LINES/TUBES: oett 9/14>>> Right IJ PICC 9/12>>> HD cath left IJ 9/14>>  DISCUSSION: 60 year old female [facility]/ course as follows: Inferior/posterior MI 9/12, Severe MR w/ ruptured posterior papillary muscle and subsequent Cardiogenic shock, then resp failure 9/14, worsening renal failure and refractory shock. Now on IABP, levophed and full vent support. Thoracic surgery consult pending. We will follow for vent management and general medical needs. Defer hemodynamics to heart failure team.  ASSESSMENT / PLAN:  PULMONARY A: Acute hypoxic respiratory failure in setting of pulmonary edema  P:   Full vent support  PAD protocol F/u abg and PCXR Volume removal as able w/ CRRT  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 and subsequent Cardiogenic shock P:  Cont IABP Wean levophed for MAP >65 Cardiothoracic surgery consult pending   RENAL A:   Acute (likely on chronic) renal failure (baseline creatinine 2.3-5.3) Recent metabolic acidosis (CRRT from 9/14 to 9/15) P:   Strict I&O Maximize hemodynamics Renal dose meds Trend BMP Replace lytes as indicated Nephro to assess for on-going CRRT needs 9/16 (holding off for 9/15)  GASTROINTESTINAL A:   Critical illness related malnutrition  P:   NPO Keep OGT  Pepcid for SUP  Consider TF in am 9/16  HEMATOLOGIC A:   Anemia of critical illness ->on Brilinta  P:  Trend cbc Heparin per pharmacy Would need Brilinta to wash out before surgery   INFECTIOUS A:   Leukocytosis-->likely stress response. Improving   P:   Trend fever curve  will hold off on abx  ENDOCRINE A:   DM  P:   Insulin gtt   Hourly cbg  NEUROLOGIC A:   Sedated  P:   RASS goal: -2 to -3 PAD protocol    FAMILY  - Updates:   - Inter-disciplinary family meet or Palliative Care meeting due by:  9/22  Erick Colace ACNP-BC [place] Pager # [phone] OR # [phone] if no answer   08/18/2016, 3:21 PM  ATTENDING NOTE / ATTESTATION NOTE :   I have discussed the case with the resident/APP  Marni Griffon.   I agree with the resident/APP's  history, physical examination, assessment, and plans.    I have edited the above note and modified it according to our agreed history, physical examination, assessment and plan.   Briefly, pt  was admitted to [facility] on 08/15/16 for chest pain and found to have ST elevation MI for which he underwent cardiac catheterization with stenting to OM1. Course complicated by cardiogenic shock, respiratory failure requiring intubation (9/14) &  worsening renal function (creatinine peaked at 3.4) requiring CVVH on 9/14.  Transferred to [facility] on 9/15 for further management of worsening Cardiogenic shock.    TEE on 9/15 shows ruptured papillary muscle with severe posterior MR. She is now on IABP support. Pulmonary capillary wedge pressure reported as 12.   She is admitted under Cardiology service. PCCM asked to manage the vent.  [facility] will be consulted re: need for CABG/surgical repair.  Cont vent support. Not ready for weaning.  Cont fentanyl drip. Versed drip started as she was moving all over on transfer.  Cont levophed drip. MAP per Cards.  Cont insulin drip.  Start TF in am if no surgery planned over the weekend.  Off abx. No evidence for infection.  Renal service following. CVVH was stopped prior to transfer and has not been resumed.  Need to decide in am if she needs CVVH.   I have spent 30 minutes of critical care time with this patient today.  Family :No family at bedside.    Monica Becton, MD 08/18/2016, 4:23 PM [place]  Pulmonary and Critical Care Pager [phone] After 3 pm or if no answer, call [phone]

## 2016-08-18 NOTE — Progress Notes (Signed)
RT Note: pt transported from cath lab to 0L41 on vent no complications

## 2016-08-18 NOTE — Care Management Note (Signed)
Case Management Note  Patient Details  Name: Deborah [name] MRN: [id number] Date of Birth: 01-Oct-1956  Subjective/Objective:  Adm w stemi,cardiogenic shock                  Action/Plan:lives at home, pcp dr walker   Expected Discharge Date:                  Expected Discharge Plan:  [place]  In-House Referral:     Discharge planning Services     Post Acute Care Choice:    Choice offered to:     DME Arranged:    DME Agency:     HH Arranged:    [place] Agency:     Status of Service:  In process, will continue to follow  If discussed at Long Length of Stay Meetings, dates discussed:    Additional Comments:pt on ventilator at present  Lacretia Leigh, RN 08/18/2016, 2:46 PM

## 2016-08-18 NOTE — Progress Notes (Signed)
Patient given ativan 1mg  IV for anxiety related behavior during bath.

## 2016-08-18 NOTE — Progress Notes (Signed)
Pre-op Cardiac Surgery  Carotid Findings:  Patient is on a balloon pump making Doppler waveforms appear abnormal. Patient also has a central line in the right jugular and a dialysis access in the left jugular. Difficult study and limited. Able to most areas of the carotid bilaterally with no obvious evidence of significant plaque or high velocities. Right vertebral artery is antegrade and the left could not be visualized well enough to evaluate.  Upper Extremity Right Left  Brachial Pressures 109   Radial Waveforms    Ulnar Waveforms    Palmar Arch (Allen's Test) abnormal normal   Findings:  Doppler waveforms were unable to be fully evaluated put present. Left brachial pressure could not be obtained due to bandages    Lower  Extremity Right Left  Anterior Tibial 113 103  Posterior Tibial 112 99  Ankle/Brachial Indices 1.04 0.94    Findings:  Unable to evalulate the Doppler waveforms due to Balloon pump. Patient has a right groin Balloon pump and Swanz-Ganz catheter. There is an arterial line in the left groin.

## 2016-08-18 NOTE — Progress Notes (Signed)
Report give to [facility] with [facility] via Telepone.

## 2016-08-19 ENCOUNTER — Inpatient Hospital Stay (HOSPITAL_COMMUNITY): Payer: Medicaid Other

## 2016-08-19 ENCOUNTER — Other Ambulatory Visit (HOSPITAL_COMMUNITY): Payer: Self-pay

## 2016-08-19 ENCOUNTER — Encounter (HOSPITAL_COMMUNITY)
Admission: EM | Disposition: A | Discharge status: Rehabilitation Facility | Payer: Self-pay | Source: Other Acute Inpatient Hospital | Attending: Cardiothoracic Surgery

## 2016-08-19 DIAGNOSIS — J9601 Acute respiratory failure with hypoxia: Secondary | ICD-10-CM

## 2016-08-19 LAB — HEPARIN LEVEL (UNFRACTIONATED)
HEPARIN UNFRACTIONATED: 0.58 [IU]/mL (ref 0.30–0.70)
Heparin Unfractionated: 0.22 IU/mL — ABNORMAL LOW (ref 0.30–0.70)

## 2016-08-19 LAB — COMPREHENSIVE METABOLIC PANEL
ALBUMIN: 1.6 g/dL — AB (ref 3.5–5.0)
ALT: 25 U/L (ref 14–54)
AST: 40 U/L (ref 15–41)
Alkaline Phosphatase: 71 U/L (ref 38–126)
Anion gap: 8 (ref 5–15)
BUN: 43 mg/dL — ABNORMAL HIGH (ref 6–20)
CALCIUM: 7.7 mg/dL — AB (ref 8.9–10.3)
CHLORIDE: 106 mmol/L (ref 101–111)
CO2: 23 mmol/L (ref 22–32)
CREATININE: 3.12 mg/dL — AB (ref 0.44–1.00)
GFR calc Af Amer: 18 mL/min — ABNORMAL LOW (ref >60)
GFR, EST NON AFRICAN AMERICAN: 15 mL/min — AB (ref >60)
Glucose, Bld: 163 mg/dL — ABNORMAL HIGH (ref 65–99)
POTASSIUM: 4.1 mmol/L (ref 3.5–5.1)
SODIUM: 137 mmol/L (ref 135–145)
TOTAL PROTEIN: 5 g/dL — AB (ref 6.5–8.1)
Total Bilirubin: 0.7 mg/dL (ref 0.3–1.2)

## 2016-08-19 LAB — CBC WITH DIFFERENTIAL/PLATELET
BASOS ABS: 0 10*3/uL (ref 0.0–0.1)
BASOS PCT: 0 %
EOS PCT: 1 %
Eosinophils Absolute: 0.1 10*3/uL (ref 0.0–0.7)
HCT: 29.7 % — ABNORMAL LOW (ref 36.0–46.0)
Hemoglobin: 9.6 g/dL — ABNORMAL LOW (ref 12.0–15.0)
LYMPHS PCT: 16 %
Lymphs Abs: 2.3 10*3/uL (ref 0.7–4.0)
MCH: 27.5 pg (ref 26.0–34.0)
MCHC: 32.3 g/dL (ref 30.0–36.0)
MCV: 85.1 fL (ref 78.0–100.0)
Monocytes Absolute: 2.2 10*3/uL — ABNORMAL HIGH (ref 0.1–1.0)
Monocytes Relative: 15 %
Neutro Abs: 10 10*3/uL — ABNORMAL HIGH (ref 1.7–7.7)
Neutrophils Relative %: 68 %
PLATELETS: 258 10*3/uL (ref 150–400)
RBC: 3.49 MIL/uL — AB (ref 3.87–5.11)
RDW: 13.7 % (ref 11.5–15.5)
WBC: 14.6 10*3/uL — AB (ref 4.0–10.5)

## 2016-08-19 LAB — GLUCOSE, CAPILLARY
GLUCOSE-CAPILLARY: 119 mg/dL — AB (ref 65–99)
GLUCOSE-CAPILLARY: 142 mg/dL — AB (ref 65–99)
GLUCOSE-CAPILLARY: 147 mg/dL — AB (ref 65–99)
GLUCOSE-CAPILLARY: 168 mg/dL — AB (ref 65–99)
GLUCOSE-CAPILLARY: 244 mg/dL — AB (ref 65–99)
Glucose-Capillary: 143 mg/dL — ABNORMAL HIGH (ref 65–99)
Glucose-Capillary: 124 mg/dL — ABNORMAL HIGH (ref 65–99)
Glucose-Capillary: 115 mg/dL — ABNORMAL HIGH (ref 65–99)
Glucose-Capillary: 117 mg/dL — ABNORMAL HIGH (ref 65–99)
Glucose-Capillary: 127 mg/dL — ABNORMAL HIGH (ref 65–99)
Glucose-Capillary: 138 mg/dL — ABNORMAL HIGH (ref 65–99)
Glucose-Capillary: 148 mg/dL — ABNORMAL HIGH (ref 65–99)
Glucose-Capillary: 248 mg/dL — ABNORMAL HIGH (ref 65–99)
Glucose-Capillary: 282 mg/dL — ABNORMAL HIGH (ref 65–99)
Glucose-Capillary: 290 mg/dL — ABNORMAL HIGH (ref 65–99)
Glucose-Capillary: 219 mg/dL — ABNORMAL HIGH (ref 65–99)
Glucose-Capillary: 172 mg/dL — ABNORMAL HIGH (ref 65–99)
Glucose-Capillary: 183 mg/dL — ABNORMAL HIGH (ref 65–99)
Glucose-Capillary: 181 mg/dL — ABNORMAL HIGH (ref 65–99)
Glucose-Capillary: 162 mg/dL — ABNORMAL HIGH (ref 65–99)
Glucose-Capillary: 153 mg/dL — ABNORMAL HIGH (ref 65–99)

## 2016-08-19 LAB — POCT I-STAT 3, ART BLOOD GAS (G3+)
ACID-BASE EXCESS: 1 mmol/L (ref 0.0–2.0)
Bicarbonate: 24.8 mmol/L (ref 20.0–28.0)
O2 SAT: 100 %
PCO2 ART: 34.3 mmHg (ref 32.0–48.0)
Patient temperature: 99.5
TCO2: 26 mmol/L (ref 0–100)
pH, Arterial: 7.469 — ABNORMAL HIGH (ref 7.350–7.450)
pO2, Arterial: 192 mmHg — ABNORMAL HIGH (ref 83.0–108.0)

## 2016-08-19 LAB — CARBOXYHEMOGLOBIN
Carboxyhemoglobin: 0.9 % (ref 0.5–1.5)
Methemoglobin: 1.2 % (ref 0.0–1.5)
O2 Saturation: 74.7 %
Total hemoglobin: 9.8 g/dL — ABNORMAL LOW (ref 12.0–16.0)

## 2016-08-19 LAB — LACTIC ACID, PLASMA: Lactic Acid, Venous: 1.4 mmol/L (ref 0.5–1.9)

## 2016-08-19 LAB — VAS US DOPPLER PRE CABG
LEFT ECA DIAS: -8 cm/s
Left CCA dist dias: -10 cm/s
Left CCA dist sys: -86 cm/s
Left CCA prox dias: 9 cm/s
Left CCA prox sys: 83 cm/s
Left ICA dist dias: 22 cm/s
Left ICA dist sys: 66 cm/s
Left ICA prox dias: -24 cm/s
Left ICA prox sys: -75 cm/s

## 2016-08-19 LAB — MAGNESIUM: Magnesium: 1.7 mg/dL (ref 1.7–2.4)

## 2016-08-19 LAB — PROCALCITONIN: Procalcitonin: 5.44 ng/mL

## 2016-08-19 LAB — PLATELET INHIBITION P2Y12

## 2016-08-19 LAB — AMYLASE: Amylase: 222 U/L — ABNORMAL HIGH (ref 28–100)

## 2016-08-19 SURGERY — CORONARY ARTERY BYPASS GRAFTING (CABG)
Anesthesia: General | Site: Chest

## 2016-08-19 MED ORDER — JEVITY 1.2 CAL PO LIQD
1000.0000 mL | ORAL | Status: DC
  Administered 2016-08-19: 1000 mL
  Filled 2016-08-19 (×2): qty 1000

## 2016-08-19 MED ORDER — PIPERACILLIN-TAZOBACTAM 3.375 G IVPB: 3.3750 g | Freq: Four times a day (QID) | INTRAVENOUS | Status: DC

## 2016-08-19 MED ORDER — ALBUMIN HUMAN 5 % IV SOLN
12.5000 g | Freq: Once | INTRAVENOUS | Status: AC
  Administered 2016-08-19: 12.5 g via INTRAVENOUS
  Filled 2016-08-19: qty 250

## 2016-08-19 MED ORDER — VANCOMYCIN HCL IN DEXTROSE 1-5 GM/200ML-% IV SOLN
1000.0000 mg | Freq: Once | INTRAVENOUS | Status: AC
  Administered 2016-08-19: 1000 mg via INTRAVENOUS
  Filled 2016-08-19: qty 200

## 2016-08-19 MED ORDER — DEXTROSE 10 % IV SOLN: INTRAVENOUS | Status: DC | PRN

## 2016-08-19 MED ORDER — SODIUM CHLORIDE 0.9 % IV SOLN
INTRAVENOUS | Status: DC
  Administered 2016-08-19 – 2016-08-23 (×3): via INTRAVENOUS

## 2016-08-19 MED ORDER — INSULIN REGULAR HUMAN 100 UNIT/ML IJ SOLN
INTRAMUSCULAR | Status: DC
  Administered 2016-08-19: 2.2 [IU]/h via INTRAVENOUS
  Administered 2016-08-20: 6.4 [IU]/h via INTRAVENOUS
  Filled 2016-08-19 (×2): qty 2.5

## 2016-08-19 MED ORDER — ATORVASTATIN CALCIUM 80 MG PO TABS
80.0000 mg | ORAL_TABLET | Freq: Every day | ORAL | Status: DC
  Administered 2016-08-19 – 2016-08-22 (×4): 80 mg via ORAL
  Filled 2016-08-19 (×4): qty 1

## 2016-08-19 MED ORDER — VITAL AF 1.2 CAL PO LIQD
1000.0000 mL | ORAL | Status: DC
  Administered 2016-08-19 – 2016-08-22 (×5): 1000 mL

## 2016-08-19 MED ORDER — VANCOMYCIN HCL IN DEXTROSE 1-5 GM/200ML-% IV SOLN: 1000.0000 mg | Freq: Once | INTRAVENOUS | Status: DC

## 2016-08-19 MED ORDER — INSULIN GLARGINE 100 UNIT/ML ~~LOC~~ SOLN
20.0000 [IU] | SUBCUTANEOUS | Status: DC
  Administered 2016-08-19: 20 [IU] via SUBCUTANEOUS
  Filled 2016-08-19: qty 0.2

## 2016-08-19 MED ORDER — INSULIN ASPART 100 UNIT/ML ~~LOC~~ SOLN
1.0000 [IU] | SUBCUTANEOUS | Status: DC
  Administered 2016-08-19 (×2): 1 [IU] via SUBCUTANEOUS
  Administered 2016-08-19: 3 [IU] via SUBCUTANEOUS

## 2016-08-19 MED ORDER — PIPERACILLIN-TAZOBACTAM 3.375 G IVPB
3.3750 g | Freq: Three times a day (TID) | INTRAVENOUS | Status: DC
  Administered 2016-08-19 – 2016-08-20 (×3): 3.375 g via INTRAVENOUS
  Filled 2016-08-19 (×5): qty 50

## 2016-08-19 MED ORDER — VASOPRESSIN 20 UNIT/ML IV SOLN
0.0200 [IU]/min | INTRAVENOUS | Status: DC
  Administered 2016-08-19: 0.02 [IU]/min via INTRAVENOUS
  Administered 2016-08-20: 0.03 [IU]/min via INTRAVENOUS
  Filled 2016-08-19 (×2): qty 2

## 2016-08-19 NOTE — Progress Notes (Signed)
[place] for Heparin Indication: IABP  No Known Allergies  Vital Signs: Temp: 100.2 F (37.9 C) (09/16 0900) Temp Source: Core (Comment) (09/16 0739) BP: 113/44 (09/16 0900) Pulse Rate: 99 (09/16 0900)  Labs:  Recent Labs  08/16/16 1737 08/16/16 2308 08/17/16 0431  08/17/16 2236  08/18/16 0911 08/18/16 1500 08/18/16 2328 08/19/16 0410 08/19/16 0731  HGB  --   --   --   < > 11.4*  --   --  10.2*  --  9.6*  --   HCT  --   --   --   < > 33.7*  --   --  31.5*  --  29.7*  --   PLT  --   --   --   < > 253  --   --  259  --  258  --   HEPARINUNFRC  --   --   --   --   --   --   --   --  0.75*  --  0.58  CREATININE 2.87*  --  3.24*  < >  --   < > 2.09* 2.77*  --  3.12*  --   TROPONINI 51.74* 38.24* 26.79*  --   --   --   --   --   --   --   --   < > = values in this interval not displayed.  Estimated Creatinine Clearance: 17.7 mL/min (by C-G formula based on SCr of 3.12 mg/dL (H)).  Assessment: 60 year old female s/p STEMI with cardiogenic shock, now with IABP, pharmacy to dose heparin. Heparin level this morning still slightly supratherapeutic. Was a report of some bleeding overnight but spoke to RN this morning who said it seems resolved. Patient was started on aggrastat which is now on hold to assess brilinta washout with the P2Y12 platelet inhibition test (scheduled for 9/16 @ 1600). Plans to resume this evening pending results.  Goal of Therapy:  Heparin level 0.2-0.5 units/ml Monitor platelets by anticoagulation protocol: Yes   Plan:  Decrease heparin to 500 units/hr Follow-up with 8 hour heparin level Follow-up with plans to resume aggrastat Monitor for signs and symptoms of bleeding  Melburn Popper, PharmD Clinical Pharmacy Resident Pager: [phone] 08/19/16 10:21 AM

## 2016-08-19 NOTE — Progress Notes (Signed)
Pharmacy Antibiotic Note  Deborah [name] is a 60 y.o. female admitted on 08/18/2016 with sepsis.  Pharmacy has been consulted for Vancomycin and Zosyn dosing.  Plan: Vancomycin 1000mg  *1 today Zosyn 3.375g every 8 hours 0500 Vancomycin random to be drawn and assessed prior to patient going to OR (redose if indicated) Monitor renal function and any plans for CRRT Follow up any culture results  Height: 5\' 1"  (154.9 cm) (from 08/15/16 encounter) Weight: 159 lb 13.3 oz (72.5 kg) IBW/kg (Calculated) : 47.8  Temp (24hrs), Avg:99.8 F (37.7 C), Min:99 F (37.2 C), Max:100.8 F (38.2 C)   Recent Labs Lab 08/15/16 1338  08/17/16 0447 08/17/16 1600 08/17/16 2236 08/18/16 0518 08/18/16 0911 08/18/16 1500 08/19/16 0410  WBC 16.3*  --  28.6*  --  26.3*  --   --  17.4* 14.6*  CREATININE 1.63*  < >  --  3.38*  --  2.29*  2.31* 2.09* 2.77* 3.12*  < > = values in this interval not displayed.  Estimated Creatinine Clearance: 17.7 mL/min (by C-G formula based on SCr of 3.12 mg/dL (H)).    No Known Allergies  Antimicrobials this admission: 9/16 vanc >>  9/16 Zosyn >>   Dose adjustments this admission: NA  Microbiology results: 9/15 BCx:  9/15 Sputum:   9/15 MRSA PCR: negative  Thank you for allowing pharmacy to be a part of this patient's care.  Melburn Popper 08/19/2016 10:35 AM

## 2016-08-19 NOTE — Progress Notes (Signed)
PULMONARY / CRITICAL CARE MEDICINE   Name: Deborah [name] MRN: [id number] DOB: Jul 26, 1956    ADMISSION DATE:  08/18/2016 CONSULTATION DATE:  9/15  REFERRING MD:  Bensimhon   CHIEF COMPLAINT:  Ventilator management and critical care services   HISTORY OF PRESENT ILLNESS:   60 year old African-American woman with past medical history significant for type 2 diabetes, hypertension, dyslipidemia and probable chronic kidney disease stage III at baseline (creatinine ~1.6-1.7) who was admitted to [facility] on 08/15/16 for chest pain and found to have ST elevation MI for which he underwent cardiac catheterization with stenting to OM1. Course complicated by cardiogenic shock, respiratory failure requiring intubation (9/14) &  worsening renal function (creatinine peaked at 3.4) with metabolic acidosis (pH 9.47 with bicarbonate of 13) and oliguria for which she was seen by nephrology (Dr. Juleen China) and started on CRRT on which she remained between 9/14-9/15. She was transferred to [facility] on 9/15 for  worsening cardiogenic shock and evidence of severe MR, she was [facility] for [facility] care on norepinephrine. She earlier underwent TEE that shows a ruptured papillary muscle with severe posterior MR. She is now on IABP support. Pulmonary capillary wedge pressure reported as 12. PCCM asked to a/w care   SUBJECTIVE:  Sedated on vent  Critically ill on IABP & pressors for cardiogenic shock Low gr fever  VITAL SIGNS: BP (!) 133/49   Pulse 100   Temp 100 F (37.8 C) (Core (Comment))   Resp 18   Ht 5\' 1"  (1.549 m) Comment: from 08/15/16 encounter  Wt 159 lb 13.3 oz (72.5 kg)   SpO2 100%   BMI 30.20 kg/m   HEMODYNAMICS: PAP: (30-38)/(20-28) 32/20 CVP:  [6 mmHg-10 mmHg] 8 mmHg PCWP:  [14 mmHg-17 mmHg] 15 mmHg CO:  [4.1 L/min-4.9 L/min] 4.9 L/min CI:  [2.4 L/min/m2-2.9 L/min/m2] 2.9 L/min/m2  VENTILATOR SETTINGS: Vent Mode: PRVC FiO2 (%):  [50 %-100 %] 50 % Set Rate:   [18 bmp] 18 bmp Vt Set:  [450 mL] 450 mL PEEP:  [10 cmH20] 10 cmH20 Plateau Pressure:  [22 cmH20-27 cmH20] 22 cmH20  INTAKE / OUTPUT: I/O last 3 completed shifts: In: 775.7 [I.V.:725.7; IV Piggyback:50] Out: 130 [Urine:115; Emesis/NG output:15]  PHYSICAL EXAMINATION: General:   sedated, and supported on vent, vasoactive gtts and IABP Neuro:  Sedated. RASS-4 HEENT:  Orally intubated. Right IJ PICC, left IJ HD cath dressings in place  Cardiovascular:  Systolic murmur III?VI, + click from iabp Lungs:  Clear,equal chest rise  Abdomen:  Soft not tender + bowel sounds  Musculoskeletal:  Equal strength right leg immobilized as IABP in place in right fem art Skin:  Cool and dry   LABS:  BMET  Recent Labs Lab 08/18/16 0911 08/18/16 1500 08/19/16 0410  NA 135 135 137  K 4.3 4.7 4.1  CL 104 105 106  CO2 24 20* 23  BUN 31* 39* 43*  CREATININE 2.09* 2.77* 3.12*  GLUCOSE 210* 355* 163*    Electrolytes  Recent Labs Lab 08/17/16 1601 08/18/16 0518 08/18/16 0911 08/18/16 1500 08/19/16 0410  CALCIUM  --  8.4*  8.2* 8.0* 8.1* 7.7*  MG 1.7 1.9  --  1.8 1.7  PHOS 4.2 2.2*  2.1*  --  2.7  --     CBC  Recent Labs Lab 08/17/16 2236 08/18/16 1500 08/19/16 0410  WBC 26.3* 17.4* 14.6*  HGB 11.4* 10.2* 9.6*  HCT 33.7* 31.5* 29.7*  PLT 253 259 258    Coag's  Recent  Labs Lab 08/15/16 1338  APTT 28  INR 1.06    Sepsis Markers No results for input(s): LATICACIDVEN, PROCALCITON, O2SATVEN in the last 168 hours.  ABG  Recent Labs Lab 08/18/16 1236 08/18/16 1545 08/19/16 0239  PHART 7.390 7.284* 7.469*  PCO2ART 40.4 41.1 34.3  PO2ART 220.0* 107.0 192.0*    Liver Enzymes  Recent Labs Lab 08/15/16 1338 08/15/16 1906  08/18/16 0518 08/18/16 1500 08/19/16 0410  AST 98* 105*  --   --   --  40  ALT 28 27  --   --   --  25  ALKPHOS 101 85  --   --   --  71  BILITOT 1.4* 1.0  --   --   --  0.7  ALBUMIN 3.4* 2.8*  < > 2.1* 1.8* 1.6*  < > = values in this  interval not displayed.  Cardiac Enzymes  Recent Labs Lab 08/16/16 1737 08/16/16 2308 08/17/16 0431  TROPONINI 51.74* 38.24* 26.79*    Glucose  Recent Labs Lab 08/18/16 1935 08/18/16 2034 08/18/16 2134 08/18/16 2230 08/18/16 2327 08/19/16 0029  GLUCAP 143* 124* 115* 117* 119* 142*    Imaging Dg Chest Port 1 View  Result Date: 08/18/2016 CLINICAL DATA:  Placement of femoral Swan-Ganz catheter and intra-aortic balloon pump catheter. EXAM: PORTABLE CHEST 1 VIEW 4:14 p.m.: COMPARISON:  Portable chest x-ray earlier today 3:37 p.m. and previously. FINDINGS: Femoral Swan-Ganz catheter tip projects at the expected location of the right middle lobe pulmonary artery. Intra-aortic balloon pump catheter tip projects over the proximal descending thoracic aorta. Endotracheal tube tip now projects approximately 2-3 cm above the carina. Right jugular central venous catheter tip projects over the upper SVC. Left jugular central venous catheter tip projects over the lower SVC. Nasogastric tube courses below the diaphragm into the stomach. Improved aeration in the left lower lobe since the most recent examination 45 minutes earlier, though streaky opacities persist. Improved perihilar airspace pulmonary edema in both lungs. No new pulmonary parenchymal abnormalities. IMPRESSION: 1. Swan-Ganz catheter tip projects at the expected location of the right middle lobe pulmonary artery. 2. Intra-aortic balloon pump catheter tip projects over the proximal descending thoracic aorta. 3. Remaining support apparatus satisfactory. 4. Improved aeration in both lungs since the examination performed 45 minutes ago, although streaky left lower lobe atelectasis and/or pneumonia and mild perihilar airspace pulmonary edema persists. 5. No new abnormalities. Electronically Signed   By: Evangeline Dakin M.D.   On: 08/18/2016 16:43   pcxr bilateral edema, improving aeration   STUDIES:  9/12. Taken to cath lab and found to  have occluded small to moderate OM-1 branch. Underwent successful PCS with stent x 2 by Dr. Clayborn Bigness. Coronaries otherwise ok. EF 55-60% with severe MR confirmed by echo. 9/15: cath lab and TEE showed normal LV and RV function with ruptured posterior papillary muscle with severe posterior MR.   CULTURES: resp 9/15 >>  bld 9/15 >>   ANTIBIOTICS:   SIGNIFICANT EVENTS: 9/16 off insulin gtt   LINES/TUBES: oett 9/14>>> Right IJ PICC 9/12>>> HD cath left IJ 9/14>>  DISCUSSION:  Inferior/posterior MI 9/12, Severe MR w/ ruptured posterior papillary muscle and subsequent Cardiogenic shock, then resp failure 9/14, worsening renal failure and refractory shock. Now on IABP, levophed and full vent support.  Off CRRT  ASSESSMENT / PLAN:  PULMONARY A: Acute hypoxic respiratory failure in setting of pulmonary edema  P:   Full vent support  Drop PEEP to 5  CARDIOVASCULAR A:  Inferior/posterior MI  Severe MR w/ ruptured posterior papillary muscle and subsequent Cardiogenic shock P:  Cont IABP Wean levophed for MAP >65 Cardiothoracic surgery following - plan is for MVR at some point  Swan parameters reviewed  RENAL A:   Acute (likely on chronic) renal failure (baseline creatinine 1.1-9.1) Recent metabolic acidosis (CRRT from 9/14 to 9/15) P:   Strict I&O Renal dose meds Trend BMP Replace lytes as indicated Nephro to assess for on-going CRRT needs 9/16 (held for 9/15)  GASTROINTESTINAL A:   Critical illness related malnutrition  P:   NPO Pepcid for SUP  Consider TF if no surgery planned  HEMATOLOGIC A:   Anemia of critical illness ->on Brilinta  P:  Trend cbc Heparin per pharmacy Would need Brilinta to wash out before surgery   INFECTIOUS A:   Leukocytosis-->likely stress response. Improving   P:   Trend fever curve  will hold off on abx -await cx data  ENDOCRINE A:   DM  P:   Off Insulin gtt with 20 U lantus   NEUROLOGIC A:   Sedated  P:   RASS  goal: -1 PAD protocol  - minimise versed   FAMILY  - Updates: non e at bedside , defer to cards  - Inter-disciplinary family meet or Palliative Care meeting due by:  9/22  My cc time x 70m  Kara Mead MD. [facility]. [place] Pulmonary & Critical care Pager [phone] If no response call [phone]     08/19/2016, 7:49 AM

## 2016-08-19 NOTE — Progress Notes (Signed)
ANTICOAGULATION CONSULT NOTE Pharmacy Consult for Heparin Indication: IABP  No Known Allergies  Vital Signs: Temp: 101.5 F (38.6 C) (09/16 1900) Temp Source: Core (Comment) (09/16 0739) BP: 87/38 (09/16 1900) Pulse Rate: 90 (09/16 1931)  Labs:  Recent Labs  08/16/16 2308 08/17/16 0431  08/17/16 2236  08/18/16 0911 08/18/16 1500 08/18/16 2328 08/19/16 0410 08/19/16 0731 08/19/16 1800  HGB  --   --   < > 11.4*  --   --  10.2*  --  9.6*  --   --   HCT  --   --   < > 33.7*  --   --  31.5*  --  29.7*  --   --   PLT  --   --   < > 253  --   --  259  --  258  --   --   HEPARINUNFRC  --   --   --   --   --   --   --  0.75*  --  0.58 0.22*  CREATININE  --  3.24*  < >  --   < > 2.09* 2.77*  --  3.12*  --   --   TROPONINI 38.24* 26.79*  --   --   --   --   --   --   --   --   --   < > = values in this interval not displayed.  Estimated Creatinine Clearance: 17.7 mL/min (by C-G formula based on SCr of 3.12 mg/dL (H)).   . sodium chloride 10 mL/hr at 08/19/16 1800  . feeding supplement (VITAL AF 1.2 CAL) 1,000 mL (08/19/16 1800)  . fentaNYL infusion INTRAVENOUS 100 mcg/hr (08/19/16 1800)  . heparin 500 Units/hr (08/19/16 1800)  . insulin (NOVOLIN-R) infusion 6.2 Units/hr (08/19/16 1846)  . midazolam (VERSED) infusion 1 mg/hr (08/19/16 1800)  . norepinephrine (LEVOPHED) Adult infusion 20.053 mcg/min (08/19/16 1800)  . tirofiban 0.075 mcg/kg/min (08/19/16 1800)  . vasopressin (PITRESSIN) infusion - *FOR SHOCK* 0.02 Units/min (08/19/16 1800)     Assessment: 60 year old female s/p STEMI with cardiogenic shock, now with IABP, pharmacy to dose heparin. Heparin level currently in goal range.  No bleeding or complications noted.  Spoke with Dr. Prescott Gum, unable to check P2Y12 for up to 48 hrs after administration of tirofiban.  Not planning for OR tomorrow due to possible sepsis.  Tirofiban infusion resumed.  Goal of Therapy:  Heparin level 0.2-0.5 units/ml Monitor platelets by  anticoagulation protocol: Yes   Plan:  Continue IV heparin at 500 units/hr. Daily heparin level and CBC. Monitor for signs and symptoms of bleeding F/u plans for OR.  Uvaldo Rising, BCPS  Clinical Pharmacist Pager [phone]  08/19/2016 7:38 PM

## 2016-08-19 NOTE — Progress Notes (Signed)
ANTICOAGULATION CONSULT NOTE Pharmacy Consult for Heparin Indication: IABP  No Known Allergies  Vital Signs: Temp: 99.5 F (37.5 C) (09/16 0019) Temp Source: Core (Comment) (09/16 0000) BP: 125/44 (09/16 0019) Pulse Rate: 94 (09/16 0019)  Labs:  Recent Labs  08/16/16 1737 08/16/16 2308 08/17/16 0431  08/17/16 0447  08/17/16 2236 08/18/16 0518 08/18/16 0911 08/18/16 1500 08/18/16 2328  HGB  --   --   --   < > 13.7  --  11.4*  --   --  10.2*  --   HCT  --   --   --   --  42.0  --  33.7*  --   --  31.5*  --   PLT  --   --   --   --  300  --  253  --   --  259  --   HEPARINUNFRC  --   --   --   --   --   --   --   --   --   --  0.75*  CREATININE 2.87*  --  3.24*  --   --   < >  --  2.29*  2.31* 2.09* 2.77*  --   TROPONINI 51.74* 38.24* 26.79*  --   --   --   --   --   --   --   --   < > = values in this interval not displayed.  Estimated Creatinine Clearance: 19.7 mL/min (by C-G formula based on SCr of 2.77 mg/dL (H)).  Assessment: 60 year old female s/p STEMI with cardiogenic shock, now with IABP, to heparin  Goal of Therapy:  Heparin level 0.2-0.5 units/ml Monitor platelets by anticoagulation protocol: Yes   Plan:  Decrease Heparin 600 units/hr Follow-up am labs.   [name], PharmD, BCPS  08/19/2016,12:37 AM

## 2016-08-19 NOTE — Progress Notes (Signed)
Nutrition Follow-up   INTERVENTION:   Initiate TF via OGT with Vital AF 1.2 at goal rate of 55 ml/h (1320 ml per day) to provide 1584 kcals, 99 gm protein, 1070 ml free water daily.   NUTRITION DIAGNOSIS:   Inadequate oral intake related to inability to eat as evidenced by NPO status.  Ongoing  GOAL:   Patient will meet greater than or equal to 90% of their needs  Unmet  MONITOR:   Vent status, Labs, Weight trends, Skin, I & O's  REASON FOR ASSESSMENT:   Ventilator, Consult Enteral/tube feeding initiation and management  ASSESSMENT:   60 y/o woman with DM2, HTN, HL and noncompliance presented to [facility] ER with infrerior posterior STEMI on 9/12. Taken to cath lab and found to have occluded small to moderate OM-1 branch. Underwent successful PCS with stent x 2 by Dr. Clayborn Bigness. Coronaries otherwise ok. EF 55-60% with severe MR confirmed by echo.   Patient is currently intubated on ventilator support MV: 8.4 L/min Temp (24hrs), Avg:100 F (37.8 C), Min:99 F (37.2 C), Max:101.1 F (38.4 C)  RD consulted for TF management. Brought to cath lab 9/15; TEE showed normal LV (EF 60-65%) and normal RV function with ruptured posterior papillary muscle with severe posterior MR per MD note. Stable overnight, some fevers. Last BM 9/11; abdomen soft with active bowel sounds per nursing notes. Pt Vital AF 1.2 infusing at 50 ml/hr via OGT at time of visit.  Labs: high glucose 138 to 290 mg/dL, elevated BUN/Creatinine, low calcium, low albumin (1.6), low GFR, low hemoglobin   Diet Order:  Diet NPO time specified  Skin:  Reviewed, no issues  Last BM:  9/11  Height:   Ht Readings from Last 1 Encounters:  08/18/16 5\' 1"  (1.549 m)    Weight:   Wt Readings from Last 1 Encounters:  08/19/16 159 lb 13.3 oz (72.5 kg)    Ideal Body Weight:  47.7 kg  BMI:  Body mass index is 30.2 kg/m.  Estimated Nutritional Needs:   Kcal:  1623  Protein:  95-110 grams  Fluid:  >1.5  L  EDUCATION NEEDS:   No education needs identified at this time  Scarlette Ar RD, LDN, CSP Inpatient Clinical Dietitian Pager: [phone] After Hours Pager: [phone]

## 2016-08-19 NOTE — Progress Notes (Signed)
[facility] Progress Note   Assessment/ Plan:   1. Acute renal failure on chronic kidney disease stage III: History and available data points to acute renal failure likely from cardiogenic shock with evolution to ATN and possible contrast-induced nephropathy following emergent coronary angiogram/intervention at [facility]. She is oliguric and without any evidence of renal recovery yet however, she also does not have any acute indications to restart CRRT based on labs/hemodynamic parameters and physical exam. Will continue close/expectant monitoring and resume CRRT if/when indicated. 2. Cardiogenic shock status post acute MI with rupture of posterior papillary muscle: On pressors and intra-aortic balloon pump support at this time. Seen by thoracic surgery who will follow her closely to decide on timing for mitral valve replacement surgery-with her poorly controlled diabetes in the past, she is expected to have problems postoperatively.  3. Acute MI: status post emergent coronary angiogram and PCI with 2 stents to OM1 branch. 4. Anemia: Hemoglobin acceptable for level of chronic kidney disease, will check iron studies  5. VDRF: Secondary to cardiogenic shock, awaiting CCM input 6. Hyponatremia: Mild and secondary to combination of congestive heart failure and acute renal failure with impaired water handling. Will continue to monitor trend.   Subjective:   No acute events noted overnight   Objective:   BP (!) 124/45   Pulse (!) 101   Temp 100 F (37.8 C)   Resp 18   Ht 5' 1"  (1.549 m) Comment: from 08/15/16 encounter  Wt 72.5 kg (159 lb 13.3 oz)   SpO2 99%   BMI 30.20 kg/m   Intake/Output Summary (Last 24 hours) at 08/19/16 1610 Last data filed at 08/19/16 0800  Gross per 24 hour  Intake           987.55 ml  Output              145 ml  Net           842.55 ml   Weight change:   Physical Exam: RUE:[id number], sedated, on IABP CVS: pulse regular tachycardia, 4/6 HSM over  precordium Resp: clear to auscultation, no rales Abd: soft, obese, nontender Ext: trace lower extremity edema  Imaging: Dg Chest Port 1 View  Result Date: 08/19/2016 CLINICAL DATA:  Pulmonary edema EXAM: PORTABLE CHEST 1 VIEW COMPARISON:  08/18/2016 FINDINGS: External pad artifact. Endotracheal tube, central venous lines, and NG tube unchanged. Swan-Ganz catheter and intra aortic balloon pump unchanged. Stable cardiac silhouette. Mild central venous pulmonary congestion. No overt pulmonary edema. No pneumothorax. IMPRESSION: 1. Stable support apparatus. 2. Central venous congestion.  No significant change. Electronically Signed   By: Suzy Bouchard M.D.   On: 08/19/2016 07:55   Dg Chest Port 1 View  Result Date: 08/18/2016 CLINICAL DATA:  Placement of femoral Swan-Ganz catheter and intra-aortic balloon pump catheter. EXAM: PORTABLE CHEST 1 VIEW 4:14 p.m.: COMPARISON:  Portable chest x-ray earlier today 3:37 p.m. and previously. FINDINGS: Femoral Swan-Ganz catheter tip projects at the expected location of the right middle lobe pulmonary artery. Intra-aortic balloon pump catheter tip projects over the proximal descending thoracic aorta. Endotracheal tube tip now projects approximately 2-3 cm above the carina. Right jugular central venous catheter tip projects over the upper SVC. Left jugular central venous catheter tip projects over the lower SVC. Nasogastric tube courses below the diaphragm into the stomach. Improved aeration in the left lower lobe since the most recent examination 45 minutes earlier, though streaky opacities persist. Improved perihilar airspace pulmonary edema in both lungs. No new pulmonary  parenchymal abnormalities. IMPRESSION: 1. Swan-Ganz catheter tip projects at the expected location of the right middle lobe pulmonary artery. 2. Intra-aortic balloon pump catheter tip projects over the proximal descending thoracic aorta. 3. Remaining support apparatus satisfactory. 4. Improved  aeration in both lungs since the examination performed 45 minutes ago, although streaky left lower lobe atelectasis and/or pneumonia and mild perihilar airspace pulmonary edema persists. 5. No new abnormalities. Electronically Signed   By: Evangeline Dakin M.D.   On: 08/18/2016 16:43   Dg Chest Port 1 View  Result Date: 08/18/2016 CLINICAL DATA:  ST-elevation myocardial infarction and cardiogenic shock. EXAM: PORTABLE CHEST 1 VIEW COMPARISON:  Yesterday FINDINGS: Endotracheal tube tip 1 cm above the carina. Central line on the right and dialysis catheter on the left with tips at the lower SVC. Orogastric tube reaches the stomach at least. Stable borderline heart size. Worsening aeration at the left base with newly obscured diaphragm. Perihilar airspace disease. IMPRESSION: 1. Lower endotracheal tube with tip 1 cm above the carina. 2. New atelectasis or consolidation in the left lower lobe with small effusion. 3. Pulmonary edema. Electronically Signed   By: Monte Fantasia M.D.   On: 08/18/2016 07:02   Dg Chest Port 1 View  Result Date: 08/17/2016 CLINICAL DATA:  Central line placement EXAM: PORTABLE CHEST 1 VIEW COMPARISON:  Earlier same day FINDINGS: Endotracheal tube is been withdrawn with the tip 2.5 cm proximal to the carina. Right internal jugular central line remains in place with its tip in the SVC above the right atrium. New dual-lumen left internal jugular central line has its tip in the SVC above the right atrium. No pneumothorax. Bilateral bat wing airspace filling is seen consistent with acute edema or acute airspace filling due to other etiologies. No effusions. No collapse. IMPRESSION: Left IJ central line well position with tip in the SVC above the right atrium. No pneumothorax. Endotracheal tube repositioned, well positioned with tip 2.5 cm above the carina. Persistent worsen bat wing edema or airspace filling pattern. Electronically Signed   By: Nelson Chimes M.D.   On: 08/17/2016 10:35    Dg Abd Portable 1v  Result Date: 08/19/2016 CLINICAL DATA:  Acidosis EXAM: PORTABLE ABDOMEN - 1 VIEW COMPARISON:  08/17/2016 FINDINGS: No dilated loops large or small bowel. NG tube in stomach. Swan-Ganz catheter noted LEFT basilar atelectasis. The kidneys are dense suggesting retained IV contrast . IMPRESSION: 1. Normal bowel-gas pattern. 2. Retained contrast in the kidneys consistent consistent with acute kidney injury. Electronically Signed   By: Suzy Bouchard M.D.   On: 08/19/2016 08:02    Labs: BMET  Recent Labs Lab 08/16/16 1737 08/17/16 0431 08/17/16 1600 08/17/16 1601 08/18/16 0518 08/18/16 0911 08/18/16 1500 08/19/16 0410  NA 137 138 129*  --  137  136 135 135 137  K 4.1 3.1* 3.8  --  4.3  4.3 4.3 4.7 4.1  CL 106 114* 103  --  106  105 104 105 106  CO2 20* 13* 20*  --  27  26 24  20* 23  GLUCOSE 160* 195* 423*  --  203*  200* 210* 355* 163*  BUN 34* 38* 37*  --  34*  32* 31* 39* 43*  CREATININE 2.87* 3.24* 3.38*  --  2.29*  2.31* 2.09* 2.77* 3.12*  CALCIUM 8.5* 6.7* 7.6*  --  8.4*  8.2* 8.0* 8.1* 7.7*  PHOS 4.3 5.0* 4.0 4.2 2.2*  2.1*  --  2.7  --    CBC  Recent Labs  Lab 08/17/16 0447 08/17/16 2236 08/18/16 1500 08/19/16 0410  WBC 28.6* 26.3* 17.4* 14.6*  NEUTROABS  --   --  14.4* 10.0*  HGB 13.7 11.4* 10.2* 9.6*  HCT 42.0 33.7* 31.5* 29.7*  MCV 86.3 85.0 86.5 85.1  PLT 300 253 259 258    Medications:    . aspirin  81 mg Oral Daily  . atorvastatin  80 mg Oral Daily  . chlorhexidine gluconate (MEDLINE KIT)  15 mL Mouth Rinse BID  . famotidine (PEPCID) IV  20 mg Intravenous Q24H  . insulin aspart  1-3 Units Subcutaneous Q4H  . insulin glargine  20 Units Subcutaneous Q24H  . mouth rinse  15 mL Mouth Rinse 10 times per day  . sodium chloride flush  10-40 mL Intracatheter Q12H  . sodium chloride flush  3 mL Intravenous Q12H   Elmarie Shiley, MD 08/19/2016, 8:23 AM

## 2016-08-19 NOTE — Progress Notes (Signed)
Patient ID: Deborah [name], female   DOB: 01/31/1956, 60 y.o.   MRN: [id number]   60 y/o woman with DM2, HTN, HL and noncompliance presented to [facility] ER with infrerior posterior STEMI on 9/12. Taken to cath lab and found to have occluded small to moderate OM-1 branch. Underwent successful PCS with stent x 2 by Dr. Clayborn Bigness. Coronaries otherwise ok. EF 55-60% with severe MR confirmed by echo.   Post-cath developed respiratory failure and required intubation. Developed shock and renal failure with peak creatinine 3.4 (1.6 on admit). Trialysis catheter placed.   Patient persistently acidotic with concern for ischemic MR and transferred here. En route develop severe hypotension and norepinephrine started.   Brought to cath lab 9/15 and TEE showed normal LV (EF 60-65%) and normal RV function with ruptured posterior papillary muscle with severe posterior MR.  Underwent placement of Swan and IABP.  Brilinta stopped 9/15, tirofiban started.   Stable overnight.  Norepinephrine weaned to 2 but BP dropped in setting of fevers (max 100.2) and now norepinephrine up to 14, BP now stable.   Swan numbers: RA 8 PA 32/20 PCWP 15 CI 2.9 Co-ox 75%  Scheduled Meds: . aspirin  81 mg Oral Daily  . atorvastatin  80 mg Oral Daily  . chlorhexidine gluconate (MEDLINE KIT)  15 mL Mouth Rinse BID  . famotidine (PEPCID) IV  20 mg Intravenous Q24H  . insulin aspart  1-3 Units Subcutaneous Q4H  . insulin glargine  20 Units Subcutaneous Q24H  . mouth rinse  15 mL Mouth Rinse 10 times per day  . sodium chloride flush  10-40 mL Intracatheter Q12H  . sodium chloride flush  3 mL Intravenous Q12H   Continuous Infusions: . dextrose    . fentaNYL infusion INTRAVENOUS 200 mcg/hr (08/19/16 0800)  . heparin 600 Units/hr (08/19/16 0800)  . insulin (NOVOLIN-R) infusion Stopped (08/19/16 0408)  . midazolam (VERSED) infusion 2 mg/hr (08/19/16 0800)  . norepinephrine (LEVOPHED) Adult infusion 15 mcg/min (08/19/16 0801)  .  tirofiban 0.075 mcg/kg/min (08/19/16 0800)   PRN Meds:.sodium chloride, acetaminophen, dextrose, fentaNYL (SUBLIMAZE) injection, heparin, midazolam, ondansetron (ZOFRAN) IV, sodium chloride flush, sodium chloride flush    Vitals:   08/19/16 0600 08/19/16 0700 08/19/16 0739 08/19/16 0800  BP: (!) 133/49 (!) 124/45    Pulse: (!) 106  100 (!) 101  Resp: 18  18 18   Temp: 100.2 F (37.9 C)  100 F (37.8 C) 100 F (37.8 C)  TempSrc:   Core (Comment)   SpO2: 100%  100% 99%  Weight:      Height:        Intake/Output Summary (Last 24 hours) at 08/19/16 0813 Last data filed at 08/19/16 0800  Gross per 24 hour  Intake           987.55 ml  Output              145 ml  Net           842.55 ml    LABS: Basic Metabolic Panel:  Recent Labs  08/18/16 0518  08/18/16 1500 08/19/16 0410  NA 137  136  < > 135 137  K 4.3  4.3  < > 4.7 4.1  CL 106  105  < > 105 106  CO2 27  26  < > 20* 23  GLUCOSE 203*  200*  < > 355* 163*  BUN 34*  32*  < > 39* 43*  CREATININE 2.29*  2.31*  < > 2.77* 3.12*  CALCIUM 8.4*  8.2*  < > 8.1* 7.7*  MG 1.9  --  1.8 1.7  PHOS 2.2*  2.1*  --  2.7  --   < > = values in this interval not displayed. Liver Function Tests:  Recent Labs  08/18/16 1500 08/19/16 0410  AST  --  40  ALT  --  25  ALKPHOS  --  71  BILITOT  --  0.7  PROT  --  5.0*  ALBUMIN 1.8* 1.6*    Recent Labs  08/19/16 0410  AMYLASE 222*   CBC:  Recent Labs  08/18/16 1500 08/19/16 0410  WBC 17.4* 14.6*  NEUTROABS 14.4* 10.0*  HGB 10.2* 9.6*  HCT 31.5* 29.7*  MCV 86.5 85.1  PLT 259 258   Cardiac Enzymes:  Recent Labs  08/16/16 1737 08/16/16 2308 08/17/16 0431  TROPONINI 51.74* 38.24* 26.79*   BNP: Invalid input(s): POCBNP D-Dimer: No results for input(s): DDIMER in the last 72 hours. Hemoglobin A1C: No results for input(s): HGBA1C in the last 72 hours. Fasting Lipid Panel: No results for input(s): CHOL, HDL, LDLCALC, TRIG, CHOLHDL, LDLDIRECT in the last  72 hours. Thyroid Function Tests: No results for input(s): TSH, T4TOTAL, T3FREE, THYROIDAB in the last 72 hours.  Invalid input(s): FREET3 Anemia Panel: No results for input(s): VITAMINB12, FOLATE, FERRITIN, TIBC, IRON, RETICCTPCT in the last 72 hours.  RADIOLOGY: Dg Chest 1 View  Result Date: 08/17/2016 CLINICAL DATA:  Endotracheal tube placement, OG tube placement EXAM: CHEST 1 VIEW COMPARISON:  Portable chest x-ray of 08/17/2016 FINDINGS: The tip of the endotracheal tube is within the right mainstem bronchus and needs to be withdrawn by approximately 5 cm per OG tube extends below the hemidiaphragm. Bilateral primarily perihilar airspace disease again is noted most consistent with edema or possibly aspiration. A small left pleural effusion is noted. Right central venous line is unchanged. IMPRESSION: 1. Endotracheal tip is within the right mainstem bronchus and needs to be withdrawn by approximately 5 cm. 2. OG tube extends below the hemidiaphragm. 3. Little change in bilateral airspace disease with small left pleural effusion. Electronically Signed   By: Ivar Drape M.D.   On: 08/17/2016 08:05   Dg Chest 1 View  Result Date: 08/17/2016 CLINICAL DATA:  Acute respiratory failure. EXAM: CHEST 1 VIEW COMPARISON:  08/15/2016 FINDINGS: Type of the right central line in the mid SVC. The bilateral perihilar opacities are becoming more confluent, air bronchograms non noted in the right suprahilar region. Stable cardiomegaly. Increasing left lung base atelectasis. Small pleural effusion on prior exam is not well visualized, suspect unchanged. No pneumothorax. IMPRESSION: Coalescing perihilar bilateral opacities, suspicious for increasing pulmonary edema. Increasing left lung base atelectasis. Electronically Signed   By: Jeb Levering M.D.   On: 08/17/2016 03:15   Dg Chest 1 View  Result Date: 08/15/2016 CLINICAL DATA:  Respiratory failure EXAM: CHEST 1 VIEW COMPARISON:  January 22, 2010 FINDINGS:  The mediastinal contour is normal. The heart size is enlarged. The diffuse airspace opacities identified throughout bilateral lungs. There is probably a minimal left pleural effusion. The osseous structures are normal. IMPRESSION: Congestive heart failure. Electronically Signed   By: Abelardo Diesel M.D.   On: 08/15/2016 19:25   Dg Abd 1 View  Result Date: 08/17/2016 CLINICAL DATA:  Endotracheal tube placement, OG tube placement EXAM: ABDOMEN - 1 VIEW COMPARISON:  CT abdomen pelvis of 12/10/2010 FINDINGS: The OG tube tip extends into the body the stomach. The bowel gas pattern is nonspecific. Contrast is noted  being excreted by the kidneys and within the urinary bladder secondary to recent CT of the abdomen pelvis. The bowel gas pattern is nonspecific. There may be a small left pleural effusion present. IMPRESSION: OG tube tip extends into the body the stomach. The bowel gas pattern is nonspecific. Electronically Signed   By: Ivar Drape M.D.   On: 08/17/2016 08:09   Dg Chest Port 1 View  Result Date: 08/19/2016 CLINICAL DATA:  Pulmonary edema EXAM: PORTABLE CHEST 1 VIEW COMPARISON:  08/18/2016 FINDINGS: External pad artifact. Endotracheal tube, central venous lines, and NG tube unchanged. Swan-Ganz catheter and intra aortic balloon pump unchanged. Stable cardiac silhouette. Mild central venous pulmonary congestion. No overt pulmonary edema. No pneumothorax. IMPRESSION: 1. Stable support apparatus. 2. Central venous congestion.  No significant change. Electronically Signed   By: Suzy Bouchard M.D.   On: 08/19/2016 07:55   Dg Chest Port 1 View  Result Date: 08/18/2016 CLINICAL DATA:  Placement of femoral Swan-Ganz catheter and intra-aortic balloon pump catheter. EXAM: PORTABLE CHEST 1 VIEW 4:14 p.m.: COMPARISON:  Portable chest x-ray earlier today 3:37 p.m. and previously. FINDINGS: Femoral Swan-Ganz catheter tip projects at the expected location of the right middle lobe pulmonary artery. Intra-aortic  balloon pump catheter tip projects over the proximal descending thoracic aorta. Endotracheal tube tip now projects approximately 2-3 cm above the carina. Right jugular central venous catheter tip projects over the upper SVC. Left jugular central venous catheter tip projects over the lower SVC. Nasogastric tube courses below the diaphragm into the stomach. Improved aeration in the left lower lobe since the most recent examination 45 minutes earlier, though streaky opacities persist. Improved perihilar airspace pulmonary edema in both lungs. No new pulmonary parenchymal abnormalities. IMPRESSION: 1. Swan-Ganz catheter tip projects at the expected location of the right middle lobe pulmonary artery. 2. Intra-aortic balloon pump catheter tip projects over the proximal descending thoracic aorta. 3. Remaining support apparatus satisfactory. 4. Improved aeration in both lungs since the examination performed 45 minutes ago, although streaky left lower lobe atelectasis and/or pneumonia and mild perihilar airspace pulmonary edema persists. 5. No new abnormalities. Electronically Signed   By: Evangeline Dakin M.D.   On: 08/18/2016 16:43   Dg Chest Port 1 View  Result Date: 08/18/2016 CLINICAL DATA:  ST-elevation myocardial infarction and cardiogenic shock. EXAM: PORTABLE CHEST 1 VIEW COMPARISON:  Yesterday FINDINGS: Endotracheal tube tip 1 cm above the carina. Central line on the right and dialysis catheter on the left with tips at the lower SVC. Orogastric tube reaches the stomach at least. Stable borderline heart size. Worsening aeration at the left base with newly obscured diaphragm. Perihilar airspace disease. IMPRESSION: 1. Lower endotracheal tube with tip 1 cm above the carina. 2. New atelectasis or consolidation in the left lower lobe with small effusion. 3. Pulmonary edema. Electronically Signed   By: Monte Fantasia M.D.   On: 08/18/2016 07:02   Dg Chest Port 1 View  Result Date: 08/17/2016 CLINICAL DATA:   Central line placement EXAM: PORTABLE CHEST 1 VIEW COMPARISON:  Earlier same day FINDINGS: Endotracheal tube is been withdrawn with the tip 2.5 cm proximal to the carina. Right internal jugular central line remains in place with its tip in the SVC above the right atrium. New dual-lumen left internal jugular central line has its tip in the SVC above the right atrium. No pneumothorax. Bilateral bat wing airspace filling is seen consistent with acute edema or acute airspace filling due to other etiologies. No effusions. No collapse. IMPRESSION:  Left IJ central line well position with tip in the SVC above the right atrium. No pneumothorax. Endotracheal tube repositioned, well positioned with tip 2.5 cm above the carina. Persistent worsen bat wing edema or airspace filling pattern. Electronically Signed   By: Nelson Chimes M.D.   On: 08/17/2016 10:35   Dg Chest Port 1 View  Result Date: 08/15/2016 CLINICAL DATA:  Central line placement EXAM: PORTABLE CHEST 1 VIEW COMPARISON:  August 15, 2016 7:04 p.m. FINDINGS: Right central venous line is identified with distal tip in superior vena cava. There is no pneumothorax. The heart size mildly enlarged. Diffuse airspace opacities identified throughout both lungs. The bones are unchanged. IMPRESSION: Right central venous line distal tip in the superior vena cava. There is no pneumothorax. Pulmonary edema unchanged compared prior exam. Electronically Signed   By: Abelardo Diesel M.D.   On: 08/15/2016 21:10   Dg Abd Portable 1v  Result Date: 08/19/2016 CLINICAL DATA:  Acidosis EXAM: PORTABLE ABDOMEN - 1 VIEW COMPARISON:  08/17/2016 FINDINGS: No dilated loops large or small bowel. NG tube in stomach. Swan-Ganz catheter noted LEFT basilar atelectasis. The kidneys are dense suggesting retained IV contrast . IMPRESSION: 1. Normal bowel-gas pattern. 2. Retained contrast in the kidneys consistent consistent with acute kidney injury. Electronically Signed   By: Suzy Bouchard  M.D.   On: 08/19/2016 08:02    PHYSICAL EXAM General: Intubated/sedated Neck: No JVD, no thyromegaly or thyroid nodule.  Lungs: Crackles dependently.  CV: Nondisplaced PMI.  Heart regular S1/S2, no S3/S4, 3/6 HSM apex.  No peripheral edema.   Abdomen: Soft, no hepatosplenomegaly, no distention.  Neurologic: Sedated.   Extremities: No clubbing or cyanosis. Right groin site w/o hematoma.   TELEMETRY: Reviewed telemetry pt in NSR   Assessment:   1. Cardiogenic shock: Due to acute severe MR.  2. CAD: Inferior posterior STEMI on 9/13 with DES x 2 to OM-1  3. 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  4. AKI on CKD stage III - due to ATN likely from cardiogenic shock + contrast with cath. Has required CVVH.  5. Acute respiratory failure: Pulmonary edema.  6. DM2 7. Fever   Plan/Discussion:     She is critically ill with cardiogenic shock in setting of inferior STEMI c/b cardiogenic shock, severe ischemic MR due to ruptured papillary muscle and AKI.   Hemodynamically much improved with IABP.  CI 2.9 this morning with normal filling pressures.  Norepinephrine still at 14 in setting of fevers overnight.  Will slowly wean norepinephrine today.  Continue IABP, likely will need in given acute severe MR until surgical repair of MR.  Keep Swan in for now.  Have consulted Dr. Prescott Gum in [place].   Last dose of Brilinta on 9/15, now on tirofiban bridge as we wait for Brilinta wash-out prior to MV surgery.   Creatinine 3.12, minimal UOP yesterday.  Had been on [facility] at [facility].  Discussed with Dr Posey Pronto, given stable labs and filling pressures, holding off for now on restarting CVVH.    Fever to 100.2, cultures sent.  Follow for now, could be systemic inflammatory response in setting of cardiogenic shock and acute MI.   Continue heparin for IABP.   The patient is critically ill with multiple organ systems failure  and requires high complexity decision making for assessment and support, frequent evaluation and titration of therapies, application of advanced monitoring technologies and extensive interpretation of multiple databases.   Critical  Care Time devoted to patient care services described in this note is 40 Minutes.  Loralie Champagne 08/19/2016 8:24 AM

## 2016-08-19 NOTE — Progress Notes (Signed)
1 Day Post-Op Procedure(s) (LRB): Right Heart Cath (N/A) IABP Insertion (N/A) Subjective: Patient's overall condition consistent with sepsis Metabolic acidosis and cardiac index are improved However arterial pressure and SVR had decreased and patient is now on high-dose vasopressin as well as norepinephrine Sputum smear shows abundant white cells and gram-positive cocci in pairs. Temperature is 101.4 Pro calcitonin panel pending She would not tolerate surgery and her current condition. CVVH not started yet. She remains anuric  Objective: Vital signs in last 24 hours: Temp:  [99 F (37.2 C)-101.1 F (38.4 C)] 101.1 F (38.4 C) (09/16 1721) Pulse Rate:  [88-108] 95 (09/16 1721) Cardiac Rhythm: Sinus tachycardia (09/16 0739) Resp:  [17-20] 18 (09/16 1721) BP: (84-142)/(35-49) 94/39 (09/16 1700) SpO2:  [95 %-100 %] 95 % (09/16 1721) Arterial Line BP: (85-334)/(32-50) 96/40 (09/16 1721) FiO2 (%):  [40 %-60 %] 40 % (09/16 1535) Weight:  [159 lb 13.3 oz (72.5 kg)] 159 lb 13.3 oz (72.5 kg) (09/16 0500)  Hemodynamic parameters for last 24 hours: PAP: (23-38)/(15-26) 24/19 CVP:  [4 mmHg-10 mmHg] 7 mmHg PCWP:  [14 mmHg-17 mmHg] 16 mmHg CO:  [3.5 L/min-4.9 L/min] 3.5 L/min CI:  [2.1 L/min/m2-2.9 L/min/m2] 2.1 L/min/m2  Intake/Output from previous day: 09/15 0701 - 09/16 0700 In: 775.7 [I.V.:725.7; IV Piggyback:50] Out: 130 [Urine:115; Emesis/NG output:15] Intake/Output this shift: Total I/O In: 1525.2 [I.V.:746.6; NG/GT:528.6; IV Piggyback:250] Out: 125 [Urine:125]  Unresponsive on vent Extremities edematous but warm Coarse breath sounds Sinus rhythm  Lab Results:  Recent Labs  08/18/16 1500 08/19/16 0410  WBC 17.4* 14.6*  HGB 10.2* 9.6*  HCT 31.5* 29.7*  PLT 259 258   BMET:  Recent Labs  08/18/16 1500 08/19/16 0410  NA 135 137  K 4.7 4.1  CL 105 106  CO2 20* 23  GLUCOSE 355* 163*  BUN 39* 43*  CREATININE 2.77* 3.12*  CALCIUM 8.1* 7.7*    PT/INR: No results  for input(s): LABPROT, INR in the last 72 hours. ABG    Component Value Date/Time   PHART 7.469 (H) 08/19/2016 0239   HCO3 24.8 08/19/2016 0239   TCO2 26 08/19/2016 0239   ACIDBASEDEF 7.0 (H) 08/18/2016 1545   O2SAT 74.7 08/19/2016 0631   CBG (last 3)   Recent Labs  08/19/16 1357 08/19/16 1448 08/19/16 1558  GLUCAP 290* 244* 219*    Assessment/Plan: S/P Procedure(s) (LRB): Right Heart Cath (N/A) IABP Insertion (N/A) Persistent cardiogenic shock  Now with evidence of sepsis -pneumonia Continue broad-spectrum antibiotics and hemodynamic support Would not tolerate cardiopulmonary bypass with current low SVR and high pressor requirements  LOS: 1 day    Tharon Aquas Trigt III 08/19/2016

## 2016-08-20 ENCOUNTER — Inpatient Hospital Stay (HOSPITAL_COMMUNITY): Payer: Medicaid Other

## 2016-08-20 DIAGNOSIS — R6521 Severe sepsis with septic shock: Secondary | ICD-10-CM

## 2016-08-20 DIAGNOSIS — A419 Sepsis, unspecified organism: Secondary | ICD-10-CM

## 2016-08-20 LAB — CARBOXYHEMOGLOBIN
Carboxyhemoglobin: 1.1 % (ref 0.5–1.5)
Methemoglobin: 1 % (ref 0.0–1.5)
O2 Saturation: 80.9 %
Total hemoglobin: 7.4 g/dL — ABNORMAL LOW (ref 12.0–16.0)

## 2016-08-20 LAB — VANCOMYCIN, RANDOM: VANCOMYCIN RM: 16

## 2016-08-20 LAB — CBC WITH DIFFERENTIAL/PLATELET
BASOS PCT: 0 %
Basophils Absolute: 0 10*3/uL (ref 0.0–0.1)
EOS ABS: 0.3 10*3/uL (ref 0.0–0.7)
Eosinophils Relative: 2 %
HCT: 22.9 % — ABNORMAL LOW (ref 36.0–46.0)
HEMOGLOBIN: 7.5 g/dL — AB (ref 12.0–15.0)
LYMPHS ABS: 1.2 10*3/uL (ref 0.7–4.0)
Lymphocytes Relative: 9 %
MCH: 27.6 pg (ref 26.0–34.0)
MCHC: 32.3 g/dL (ref 30.0–36.0)
MCV: 85.4 fL (ref 78.0–100.0)
Monocytes Absolute: 1.7 10*3/uL — ABNORMAL HIGH (ref 0.1–1.0)
Monocytes Relative: 13 %
NEUTROS PCT: 75 %
Neutro Abs: 9.5 10*3/uL — ABNORMAL HIGH (ref 1.7–7.7)
PLATELETS: 230 10*3/uL (ref 150–400)
RBC: 2.68 MIL/uL — AB (ref 3.87–5.11)
RDW: 14.5 % (ref 11.5–15.5)
WBC: 12.6 10*3/uL — AB (ref 4.0–10.5)

## 2016-08-20 LAB — BLOOD GAS, ARTERIAL
ACID-BASE DEFICIT: 3.5 mmol/L — AB (ref 0.0–2.0)
Bicarbonate: 20.9 mmol/L (ref 20.0–28.0)
DRAWN BY: 441381
FIO2: 40
MECHVT: 450 mL
O2 Saturation: 97.8 %
PEEP/CPAP: 5 cmH2O
PH ART: 7.367 (ref 7.350–7.450)
PO2 ART: 107 mmHg (ref 83.0–108.0)
Patient temperature: 98.6
RATE: 18 resp/min
pCO2 arterial: 37.3 mmHg (ref 32.0–48.0)

## 2016-08-20 LAB — CBC
HCT: 26.4 % — ABNORMAL LOW (ref 36.0–46.0)
HEMATOCRIT: 23.9 % — AB (ref 36.0–46.0)
Hemoglobin: 7.7 g/dL — ABNORMAL LOW (ref 12.0–15.0)
Hemoglobin: 8.5 g/dL — ABNORMAL LOW (ref 12.0–15.0)
MCH: 27.6 pg (ref 26.0–34.0)
MCH: 28.2 pg (ref 26.0–34.0)
MCHC: 32.2 g/dL (ref 30.0–36.0)
MCHC: 32.2 g/dL (ref 30.0–36.0)
MCV: 85.7 fL (ref 78.0–100.0)
MCV: 87.7 fL (ref 78.0–100.0)
Platelets: 234 10*3/uL (ref 150–400)
Platelets: 216 K/uL (ref 150–400)
RBC: 2.79 MIL/uL — ABNORMAL LOW (ref 3.87–5.11)
RBC: 3.01 MIL/uL — ABNORMAL LOW (ref 3.87–5.11)
RDW: 14 % (ref 11.5–15.5)
RDW: 14.4 % (ref 11.5–15.5)
WBC: 13.3 10*3/uL — AB (ref 4.0–10.5)
WBC: 13.1 K/uL — ABNORMAL HIGH (ref 4.0–10.5)

## 2016-08-20 LAB — GLUCOSE, CAPILLARY
GLUCOSE-CAPILLARY: 119 mg/dL — AB (ref 65–99)
GLUCOSE-CAPILLARY: 195 mg/dL — AB (ref 65–99)
GLUCOSE-CAPILLARY: 170 mg/dL — AB (ref 65–99)
GLUCOSE-CAPILLARY: 146 mg/dL — AB (ref 65–99)
GLUCOSE-CAPILLARY: 216 mg/dL — AB (ref 65–99)
GLUCOSE-CAPILLARY: 128 mg/dL — AB (ref 65–99)
GLUCOSE-CAPILLARY: 107 mg/dL — AB (ref 65–99)
GLUCOSE-CAPILLARY: 150 mg/dL — AB (ref 65–99)
Glucose-Capillary: 154 mg/dL — ABNORMAL HIGH (ref 65–99)
Glucose-Capillary: 141 mg/dL — ABNORMAL HIGH (ref 65–99)
Glucose-Capillary: 108 mg/dL — ABNORMAL HIGH (ref 65–99)
Glucose-Capillary: 87 mg/dL (ref 65–99)
Glucose-Capillary: 227 mg/dL — ABNORMAL HIGH (ref 65–99)
Glucose-Capillary: 220 mg/dL — ABNORMAL HIGH (ref 65–99)
Glucose-Capillary: 214 mg/dL — ABNORMAL HIGH (ref 65–99)
Glucose-Capillary: 178 mg/dL — ABNORMAL HIGH (ref 65–99)
Glucose-Capillary: 193 mg/dL — ABNORMAL HIGH (ref 65–99)
Glucose-Capillary: 144 mg/dL — ABNORMAL HIGH (ref 65–99)
Glucose-Capillary: 148 mg/dL — ABNORMAL HIGH (ref 65–99)
Glucose-Capillary: 142 mg/dL — ABNORMAL HIGH (ref 65–99)
Glucose-Capillary: 130 mg/dL — ABNORMAL HIGH (ref 65–99)
Glucose-Capillary: 159 mg/dL — ABNORMAL HIGH (ref 65–99)

## 2016-08-20 LAB — COMPREHENSIVE METABOLIC PANEL
ALK PHOS: 64 U/L (ref 38–126)
ALT: 25 U/L (ref 14–54)
AST: 41 U/L (ref 15–41)
Albumin: 1.6 g/dL — ABNORMAL LOW (ref 3.5–5.0)
Anion gap: 8 (ref 5–15)
BILIRUBIN TOTAL: 0.5 mg/dL (ref 0.3–1.2)
BUN: 58 mg/dL — AB (ref 6–20)
CALCIUM: 7.3 mg/dL — AB (ref 8.9–10.3)
CHLORIDE: 106 mmol/L (ref 101–111)
CO2: 22 mmol/L (ref 22–32)
CREATININE: 3.94 mg/dL — AB (ref 0.44–1.00)
GFR, EST AFRICAN AMERICAN: 13 mL/min — AB (ref >60)
GFR, EST NON AFRICAN AMERICAN: 12 mL/min — AB (ref >60)
Glucose, Bld: 161 mg/dL — ABNORMAL HIGH (ref 65–99)
Potassium: 4.1 mmol/L (ref 3.5–5.1)
Sodium: 136 mmol/L (ref 135–145)
Total Protein: 5 g/dL — ABNORMAL LOW (ref 6.5–8.1)

## 2016-08-20 LAB — AMYLASE: Amylase: 185 U/L — ABNORMAL HIGH (ref 28–100)

## 2016-08-20 LAB — MAGNESIUM: MAGNESIUM: 1.9 mg/dL (ref 1.7–2.4)

## 2016-08-20 LAB — LACTIC ACID, PLASMA: Lactic Acid, Venous: 1.3 mmol/L (ref 0.5–1.9)

## 2016-08-20 LAB — PROCALCITONIN: Procalcitonin: 5.24 ng/mL

## 2016-08-20 LAB — HEPARIN LEVEL (UNFRACTIONATED): Heparin Unfractionated: 0.29 IU/mL — ABNORMAL LOW (ref 0.30–0.70)

## 2016-08-20 LAB — PREPARE RBC (CROSSMATCH)

## 2016-08-20 MED ORDER — DEXTROSE 5 % IV SOLN
120.0000 mg | Freq: Once | INTRAVENOUS | Status: AC
  Administered 2016-08-20: 120 mg via INTRAVENOUS
  Filled 2016-08-20: qty 12

## 2016-08-20 MED ORDER — PIPERACILLIN-TAZOBACTAM IN DEX 2-0.25 GM/50ML IV SOLN
2.2500 g | Freq: Three times a day (TID) | INTRAVENOUS | Status: DC
  Administered 2016-08-20 – 2016-08-21 (×4): 2.25 g via INTRAVENOUS
  Filled 2016-08-20 (×5): qty 50

## 2016-08-20 MED ORDER — DEXTROSE 10 % IV SOLN: INTRAVENOUS | Status: DC | PRN

## 2016-08-20 MED ORDER — VANCOMYCIN HCL IN DEXTROSE 1-5 GM/200ML-% IV SOLN
1000.0000 mg | INTRAVENOUS | Status: DC
  Administered 2016-08-21: 1000 mg via INTRAVENOUS
  Filled 2016-08-20: qty 200

## 2016-08-20 MED ORDER — INSULIN ASPART 100 UNIT/ML ~~LOC~~ SOLN
2.0000 [IU] | SUBCUTANEOUS | Status: DC
  Administered 2016-08-21: 4 [IU] via SUBCUTANEOUS
  Administered 2016-08-21 (×2): 6 [IU] via SUBCUTANEOUS

## 2016-08-20 MED ORDER — INSULIN GLARGINE 100 UNIT/ML ~~LOC~~ SOLN
30.0000 [IU] | SUBCUTANEOUS | Status: DC
  Administered 2016-08-21: 30 [IU] via SUBCUTANEOUS
  Filled 2016-08-20: qty 0.3

## 2016-08-20 MED ORDER — INSULIN ASPART 100 UNIT/ML ~~LOC~~ SOLN
5.0000 [IU] | SUBCUTANEOUS | Status: DC
  Administered 2016-08-21 (×3): 5 [IU] via SUBCUTANEOUS

## 2016-08-20 MED ORDER — SODIUM CHLORIDE 0.9 % IV SOLN
Freq: Once | INTRAVENOUS | Status: AC
  Administered 2016-08-20: 09:00:00 via INTRAVENOUS

## 2016-08-20 NOTE — Progress Notes (Addendum)
PULMONARY / CRITICAL CARE MEDICINE   Name: Deborah [name] MRN: [id number] DOB: 04/09/1956    ADMISSION DATE:  08/18/2016 CONSULTATION DATE:  9/15  REFERRING MD:  Bensimhon   CHIEF COMPLAINT:  Ventilator management and critical care services   HISTORY OF PRESENT ILLNESS:   60 year old African-American woman with past medical history significant for type 2 diabetes, hypertension, dyslipidemia and probable chronic kidney disease stage III at baseline (creatinine ~1.6-1.7) who was admitted to [facility] on 08/15/16 for chest pain and found to have ST elevation MI for which he underwent cardiac catheterization with stenting to OM1. Course complicated by cardiogenic shock, respiratory failure requiring intubation (9/14) &  worsening renal function (creatinine peaked at 3.4) with metabolic acidosis (pH 8.11 with bicarbonate of 13) and oliguria for which she was seen by nephrology (Dr. Juleen China) and started on CRRT on which she remained between 9/14-9/15. She was transferred to [facility] on 9/15 for  worsening cardiogenic shock and evidence of severe MR, she was referred to [facility] for specialist care on norepinephrine. She earlier underwent TEE that shows a ruptured papillary muscle with severe posterior MR. She is now on IABP support. Pulmonary capillary wedge pressure reported as 12. PCCM asked to a/w care   SUBJECTIVE:  Remains Critically ill on IABP & pressors for cardiogenic shock Sedated on vent  Febrile overnight Oliguric  VITAL SIGNS: BP 115/79   Pulse 92   Temp 99.1 F (37.3 C)   Resp 18   Ht 5\' 1"  (1.549 m) Comment: from 08/15/16 encounter  Wt 77.9 kg (171 lb 11.8 oz)   SpO2 94%   BMI 32.45 kg/m   HEMODYNAMICS: PAP: (23-43)/(15-27) 39/23 CVP:  [4 mmHg-13 mmHg] 11 mmHg PCWP:  [16 mmHg-20 mmHg] 20 mmHg CO:  [3.5 L/min-4.4 L/min] 4.2 L/min CI:  [2.1 L/min/m2-2.6 L/min/m2] 2.5 L/min/m2  VENTILATOR SETTINGS: Vent Mode: PRVC FiO2 (%):  [40 %] 40 % Set Rate:  [18  bmp] 18 bmp Vt Set:  [450 mL] 450 mL PEEP:  [5 cmH20] 5 cmH20 Plateau Pressure:  [20 cmH20-22 cmH20] 20 cmH20  INTAKE / OUTPUT: I/O last 3 completed shifts: In: 3986.4 [I.V.:2237.8; NG/GT:1298.6; IV Piggyback:450] Out: 47 [Urine:405; Emesis/NG output:15]  PHYSICAL EXAMINATION: General:   sedated, and supported on vent, vasoactive gtts and IABP Neuro:  Sedated. RASS-4 HEENT:  Orally intubated. Right IJ PICC, left IJ HD cath dressings in place  Cardiovascular:  Systolic murmur III?VI, + click from iabp Lungs:  Clear,equal chest rise  Abdomen:  Soft not tender + bowel sounds  Musculoskeletal:  Equal strength right leg immobilized as IABP in place in right fem art Skin:  Cool and dry   LABS:  BMET  Recent Labs Lab 08/18/16 1500 08/19/16 0410 08/20/16 0500  NA 135 137 136  K 4.7 4.1 4.1  CL 105 106 106  CO2 20* 23 22  BUN 39* 43* 58*  CREATININE 2.77* 3.12* 3.94*  GLUCOSE 355* 163* 161*    Electrolytes  Recent Labs Lab 08/17/16 1601 08/18/16 0518  08/18/16 1500 08/19/16 0410 08/20/16 0500  CALCIUM  --  8.4*  8.2*  < > 8.1* 7.7* 7.3*  MG 1.7 1.9  --  1.8 1.7 1.9  PHOS 4.2 2.2*  2.1*  --  2.7  --   --   < > = values in this interval not displayed.  CBC  Recent Labs Lab 08/19/16 0410 08/20/16 0500 08/20/16 0723  WBC 14.6* 12.6* 13.3*  HGB 9.6* 7.5* 7.7*  HCT  29.7* 22.9* 23.9*  PLT 258 230 234    Coag's  Recent Labs Lab 08/15/16 1338  APTT 28  INR 1.06    Sepsis Markers  Recent Labs Lab 08/19/16 1451 08/19/16 1706 08/20/16 0500  LATICACIDVEN 1.4  --  1.3  PROCALCITON  --  5.44 5.24    ABG  Recent Labs Lab 08/18/16 1545 08/19/16 0239 08/20/16 0505  PHART 7.284* 7.469* 7.367  PCO2ART 41.1 34.3 37.3  PO2ART 107.0 192.0* 107    Liver Enzymes  Recent Labs Lab 08/15/16 1906  08/18/16 1500 08/19/16 0410 08/20/16 0500  AST 105*  --   --  40 41  ALT 27  --   --  25 25  ALKPHOS 85  --   --  71 64  BILITOT 1.0  --   --  0.7  0.5  ALBUMIN 2.8*  < > 1.8* 1.6* 1.6*  < > = values in this interval not displayed.  Cardiac Enzymes  Recent Labs Lab 08/16/16 1737 08/16/16 2308 08/17/16 0431  TROPONINI 51.74* 38.24* 26.79*    Glucose  Recent Labs Lab 08/20/16 0305 08/20/16 0406 08/20/16 0505 08/20/16 0615 08/20/16 0719 08/20/16 0856  GLUCAP 195* 170* 146* 108* 87 216*    Imaging Dg Chest Port 1 View  Result Date: 08/20/2016 CLINICAL DATA:  Pt was hypotensive with respiratory failure x a few days ago. Hx of DM, and HTN. Pt had a cardiac cath on 08-15-16. EXAM: PORTABLE CHEST 1 VIEW COMPARISON:  08/19/2016 FINDINGS: Endotracheal to appears low at the level of the carina. LEFT and RIGHT central venous line are unchanged. Swan-Ganz catheter from an inferior approach unchanged. There is some improvement aeration lung bases. Mild pulmonary edema remains. No pneumothorax. IMPRESSION: 1. Endotracheal tube appears low at the level the carina. 2. Stable support apparatus. 3. Slight improved aeration lung bases with persistent mild edema. Electronically Signed   By: Suzy Bouchard M.D.   On: 08/20/2016 07:58   pcxr bilateral edema, improving aeration   STUDIES:  9/12. Taken to cath lab and found to have occluded small to moderate OM-1 branch. Underwent successful PCS with stent x 2 by Dr. Clayborn Bigness. Coronaries otherwise ok. EF 55-60% with severe MR confirmed by echo. 9/15: cath lab and TEE showed normal LV and RV function with ruptured posterior papillary muscle with severe posterior MR.   CULTURES: resp 9/15 >> gr B strep  bld 9/15 >>   ANTIBIOTICS: Zosyn 9/16 >> vanc 9/16 >>  SIGNIFICANT EVENTS: 9/16 off insulin gtt   LINES/TUBES: oett 9/14>>> Right IJ PICC 9/12>>> HD cath left IJ 9/14>>  DISCUSSION:  Inferior/posterior MI 9/12, Severe MR w/ ruptured posterior papillary muscle and subsequent Cardiogenic shock, then resp failure 9/14, worsening renal failure and refractory shock. Now on IABP, levophed  and full vent support.  Off CRRT  ASSESSMENT / PLAN:  PULMONARY A: Acute hypoxic respiratory failure in setting of pulmonary edema  P:   Full vent support  No wean  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 and subsequent Cardiogenic shock P:  Cont IABP Wean levophed for MAP >65 Cardiothoracic surgery following - plan is for MVR at some point  Swan parameters reviewed On heparin + aggrastat  RENAL A:   Acute (likely on chronic) renal failure (baseline creatinine 1.6-1.0) Recent metabolic acidosis (CRRT from 9/14 to 9/15) P:   Strict I&O Renal dose meds Trend BMP Replace lytes as indicated Nephro to assess for CRRT needs (held since 9/15)  GASTROINTESTINAL A:   Critical illness related malnutrition  P:   NPO Pepcid for SUP  Ct TF   HEMATOLOGIC A:   Anemia of critical illness ->on Brilinta  P:  Trend cbc Heparin per pharmacy   INFECTIOUS A:   Leukocytosis-->likely stress response. Improving   P:   Trend fever curve  Start empiric zosyn/vanc - for strep in sputum given fevers & high pct  -can simplify to levaquin eventual  ENDOCRINE A:   DM  P:   Back on Insulin gtt - needs tight control  received 20 U lantus on 9/16   NEUROLOGIC A:   Sedated  P:   RASS goal: -1 PAD protocol  - fent gtt, minimise versed   FAMILY  - Updates:  defer to cards  - Inter-disciplinary family meet or Palliative Care meeting due by:  9/22  My cc time x 72m  Kara Mead MD. [facility]. [place] [facility] Pager [phone] If no response call [phone]     08/20/2016, 9:10 AM

## 2016-08-20 NOTE — Progress Notes (Signed)
2 Days Post-Op Procedure(s) (LRB): Right Heart Cath (N/A) IABP Insertion (N/A) Subjective: Improvement and temperature curve and pressor requirements over the past 24 hours--now weaned off vasopressin Sputum culture positive for streptococcal pneumonia Patient remains anuric and is retaining significant fluid Dialysis catheter in place but not yet being used Prior to mitral valve replacement the patient will need further antibiotic therapy and will need to be placed on CRT to remove fluid as the patient will retain significant fluid with surgery  Objective: Vital signs in last 24 hours: Temp:  [98.6 F (37 C)-101.5 F (38.6 C)] 98.8 F (37.1 C) (09/17 1300) Pulse Rate:  [56-97] 95 (09/17 1300) Cardiac Rhythm: Normal sinus rhythm (09/17 0800) Resp:  [18-21] 18 (09/17 1300) BP: (83-125)/(35-83) 93/71 (09/17 1300) SpO2:  [91 %-100 %] 99 % (09/17 1300) Arterial Line BP: (85-148)/(34-49) 94/42 (09/17 1300) FiO2 (%):  [40 %] 40 % (09/17 1141) Weight:  [171 lb 11.8 oz (77.9 kg)] 171 lb 11.8 oz (77.9 kg) (09/17 0500)  Hemodynamic parameters for last 24 hours: PAP: (24-46)/(17-27) 42/25 CVP:  [6 mmHg-15 mmHg] 15 mmHg PCWP:  [16 mmHg-20 mmHg] 20 mmHg CO:  [3.5 L/min-4.2 L/min] 4.2 L/min CI:  [2.1 L/min/m2-2.5 L/min/m2] 2.5 L/min/m2  Intake/Output from previous day: 09/16 0701 - 09/17 0700 In: 3381.6 [I.V.:1633; NG/GT:1298.6; IV Piggyback:450] Out: 295 [Urine:295] Intake/Output this shift: Total I/O In: 1013.3 [I.V.:298.3; Blood:335; NG/GT:330; IV Piggyback:50] Out: 65 [Urine:65]  Patient remains sedated and intubated 2/6 systolic murmur Extremities  with edema but warm Blood loss anemia treated with one unit packed cells Lab Results:  Recent Labs  08/20/16 0500 08/20/16 0723  WBC 12.6* 13.3*  HGB 7.5* 7.7*  HCT 22.9* 23.9*  PLT 230 234   BMET:  Recent Labs  08/19/16 0410 08/20/16 0500  NA 137 136  K 4.1 4.1  CL 106 106  CO2 23 22  GLUCOSE 163* 161*  BUN 43* 58*   CREATININE 3.12* 3.94*  CALCIUM 7.7* 7.3*    PT/INR: No results for input(s): LABPROT, INR in the last 72 hours. ABG    Component Value Date/Time   PHART 7.367 08/20/2016 0505   HCO3 20.9 08/20/2016 0505   TCO2 26 08/19/2016 0239   ACIDBASEDEF 3.5 (H) 08/20/2016 0505   O2SAT 97.8 08/20/2016 0505   CBG (last 3)   Recent Labs  08/20/16 0955 08/20/16 1059 08/20/16 1159  GLUCAP 227* 220* 214*    Assessment/Plan: S/P Procedure(s) (LRB): Right Heart Cath (N/A) IABP Insertion (N/A) Patient not yet candidate for cardiac surgery Sepsis and volume excess from acute renal failure need more time for therapy   LOS: 2 days    Tharon Aquas Trigt III 08/20/2016

## 2016-08-20 NOTE — Progress Notes (Signed)
Pharmacy Antibiotic Note  Deborah [name] is a 60 y.o. female admitted on 08/18/2016 with sepsis.  Pharmacy has been consulted for Vancomycin and Zosyn dosing. Vanco random level 16 in goal for sepsis. Scr 3.94 worsened. Anuric. No OR likely due to sepsis and hemodynamic instability.  Plan: Vancomycin 1000mg  IV q48h unless starts CVVHD. Then readjust. Adjust Zosyn to 2.25g IV q8hours   Height: 5\' 1"  (154.9 cm) (from 08/15/16 encounter) Weight: 159 lb 13.3 oz (72.5 kg) IBW/kg (Calculated) : 47.8  Temp (24hrs), Avg:100.3 F (37.9 C), Min:99 F (37.2 C), Max:101.5 F (38.6 C)   Recent Labs Lab 08/17/16 0447  08/17/16 2236 08/18/16 0518 08/18/16 0911 08/18/16 1500 08/19/16 0410 08/19/16 1451 08/20/16 0500  WBC 28.6*  --  26.3*  --   --  17.4* 14.6*  --  12.6*  CREATININE  --   < >  --  2.29*  2.31* 2.09* 2.77* 3.12*  --  3.94*  LATICACIDVEN  --   --   --   --   --   --   --  1.4 1.3  VANCORANDOM  --   --   --   --   --   --   --   --  16  < > = values in this interval not displayed.  Estimated Creatinine Clearance: 14 mL/min (by C-G formula based on SCr of 3.94 mg/dL (H)).    No Known Allergies  Antimicrobials this admission: 9/16 vanc >> --9/17 VR: 16  9/16 Zosyn >>   Dose adjustments this admission: NA  Microbiology results: 9/15 BCx:  9/15 Sputum:  GPC pairs/chains, rare yeast, Group B strep 9/15 MRSA PCR: negative  Thank you for allowing pharmacy to be a part of this patient's care.  Markelle Asaro S. Alford Highland, PharmD, [place] Clinical Staff Pharmacist Pager [phone]  Eilene Ghazi [facility] 08/20/2016 6:04 AM

## 2016-08-20 NOTE — Progress Notes (Signed)
[facility] Progress Note   Assessment/ Plan:   1. Acute renal failure on chronic kidney disease stage III: AKI likely from cardiogenic shock with evolution to ATN and contrast nephropathy after emergent coronary angiogram/intervention at [facility]. She remains oliguric and without any evidence of renal recovery yet. Based on physical exam/labs/hemodynamic parameters, she does not have any acute indications to restart CRRT. Will continue close/expectant monitoring and resume CRRT if/when indicated. 2. Cardiogenic shock status post acute MI with rupture of posterior papillary muscle: On pressors and intra-aortic balloon pump support at this time. Ongoing close follow-up by [facility] for definitive management with mitral valve replacement.  3. Acute MI: status post emergent coronary angiogram and PCI with 2 stents to OM1 branch. 4. Anemia:  getting blood transfusion at this time. 5. VDRF: Secondary to cardiogenic shock, continue ventilator management per CCM. 6. Fever/group B streptococci and sputum cultures: Started on broad-spectrum antibiotics and ongoing pressor support.  Subjective:   Febrile overnight, hypotensive and requiring albumin/adjustment of pressors. Ongoing blood transfusion at this time.    Objective:   BP 115/79   Pulse 86   Temp 99.3 F (37.4 C)   Resp 18   Ht 5' 1" (1.549 m) Comment: from 08/15/16 encounter  Wt 77.9 kg (171 lb 11.8 oz)   SpO2 100%   BMI 32.45 kg/m   Intake/Output Summary (Last 24 hours) at 08/20/16 0843 Last data filed at 08/20/16 0700  Gross per 24 hour  Intake          3169.69 ml  Output              280 ml  Net          2889.69 ml   Weight change: 5 kg (11 lb 0.4 oz)  Physical Exam: LOV:FIEPPIRJJ, sedated, on IABP CVS: pulse regular tachycardia, 4/6 HSM over precordium Resp: clear to auscultation, no rales Abd: soft, obese, nontender Ext: trace lower extremity edema  Imaging: Dg Chest Port 1 View  Result Date:  08/20/2016 CLINICAL DATA:  Pt was hypotensive with respiratory failure x a few days ago. Hx of DM, and HTN. Pt had a cardiac cath on 08-15-16. EXAM: PORTABLE CHEST 1 VIEW COMPARISON:  08/19/2016 FINDINGS: Endotracheal to appears low at the level of the carina. LEFT and RIGHT central venous line are unchanged. Swan-Ganz catheter from an inferior approach unchanged. There is some improvement aeration lung bases. Mild pulmonary edema remains. No pneumothorax. IMPRESSION: 1. Endotracheal tube appears low at the level the carina. 2. Stable support apparatus. 3. Slight improved aeration lung bases with persistent mild edema. Electronically Signed   By: Suzy Bouchard M.D.   On: 08/20/2016 07:58   Dg Chest Port 1 View  Result Date: 08/19/2016 CLINICAL DATA:  Pulmonary edema EXAM: PORTABLE CHEST 1 VIEW COMPARISON:  08/18/2016 FINDINGS: External pad artifact. Endotracheal tube, central venous lines, and NG tube unchanged. Swan-Ganz catheter and intra aortic balloon pump unchanged. Stable cardiac silhouette. Mild central venous pulmonary congestion. No overt pulmonary edema. No pneumothorax. IMPRESSION: 1. Stable support apparatus. 2. Central venous congestion.  No significant change. Electronically Signed   By: Suzy Bouchard M.D.   On: 08/19/2016 07:55   Dg Chest Port 1 View  Result Date: 08/18/2016 CLINICAL DATA:  Placement of femoral Swan-Ganz catheter and intra-aortic balloon pump catheter. EXAM: PORTABLE CHEST 1 VIEW 4:14 p.m.: COMPARISON:  Portable chest x-ray earlier today 3:37 p.m. and previously. FINDINGS: Femoral Swan-Ganz catheter tip projects at the expected location of the right middle  lobe pulmonary artery. Intra-aortic balloon pump catheter tip projects over the proximal descending thoracic aorta. Endotracheal tube tip now projects approximately 2-3 cm above the carina. Right jugular central venous catheter tip projects over the upper SVC. Left jugular central venous catheter tip projects over the  lower SVC. Nasogastric tube courses below the diaphragm into the stomach. Improved aeration in the left lower lobe since the most recent examination 45 minutes earlier, though streaky opacities persist. Improved perihilar airspace pulmonary edema in both lungs. No new pulmonary parenchymal abnormalities. IMPRESSION: 1. Swan-Ganz catheter tip projects at the expected location of the right middle lobe pulmonary artery. 2. Intra-aortic balloon pump catheter tip projects over the proximal descending thoracic aorta. 3. Remaining support apparatus satisfactory. 4. Improved aeration in both lungs since the examination performed 45 minutes ago, although streaky left lower lobe atelectasis and/or pneumonia and mild perihilar airspace pulmonary edema persists. 5. No new abnormalities. Electronically Signed   By: Evangeline Dakin M.D.   On: 08/18/2016 16:43   Dg Abd Portable 1v  Result Date: 08/19/2016 CLINICAL DATA:  Acidosis EXAM: PORTABLE ABDOMEN - 1 VIEW COMPARISON:  08/17/2016 FINDINGS: No dilated loops large or small bowel. NG tube in stomach. Swan-Ganz catheter noted LEFT basilar atelectasis. The kidneys are dense suggesting retained IV contrast . IMPRESSION: 1. Normal bowel-gas pattern. 2. Retained contrast in the kidneys consistent consistent with acute kidney injury. Electronically Signed   By: Suzy Bouchard M.D.   On: 08/19/2016 08:02    Labs: BMET  Recent Labs Lab 08/16/16 1737 08/17/16 0431 08/17/16 1600 08/17/16 1601 08/18/16 0518 08/18/16 0911 08/18/16 1500 08/19/16 0410 08/20/16 0500  NA 137 138 129*  --  137  136 135 135 137 136  K 4.1 3.1* 3.8  --  4.3  4.3 4.3 4.7 4.1 4.1  CL 106 114* 103  --  106  105 104 105 106 106  CO2 20* 13* 20*  --  _0 20* 23 22  GLUCOSE 160* 195* 423*  --  203*  200* 210* 355* 163* 161*  BUN 34* 38* 37*  --  34*  32* 31* 39* 43* 58*  CREATININE 2.87* 3.24* 3.38*  --  2.29*  2.31* 2.09* 2.77* 3.12* 3.94*  CALCIUM 8.5* 6.7* 7.6*  --  8.4*   8.2* 8.0* 8.1* 7.7* 7.3*  PHOS 4.3 5.0* 4.0 4.2 2.2*  2.1*  --  2.7  --   --    CBC  Recent Labs Lab 08/18/16 1500 08/19/16 0410 08/20/16 0500 08/20/16 0723  WBC 17.4* 14.6* 12.6* 13.3*  NEUTROABS 14.4* 10.0* 9.5*  --   HGB 10.2* 9.6* 7.5* 7.7*  HCT 31.5* 29.7* 22.9* 23.9*  MCV 86.5 85.1 85.4 85.7  PLT 259 258 230 234    Medications:    . sodium chloride   Intravenous Once  . aspirin  81 mg Oral Daily  . atorvastatin  80 mg Oral Daily  . chlorhexidine gluconate (MEDLINE KIT)  15 mL Mouth Rinse BID  . famotidine (PEPCID) IV  20 mg Intravenous Q24H  . furosemide  120 mg Intravenous Once  . mouth rinse  15 mL Mouth Rinse 10 times per day  . piperacillin-tazobactam (ZOSYN)  IV  2.25 g Intravenous Q8H  . [START ON 08/21/2016] vancomycin  1,000 mg Intravenous Q48H   Elmarie Shiley, MD 08/20/2016, 8:43 AM

## 2016-08-20 NOTE — Progress Notes (Signed)
ANTICOAGULATION CONSULT NOTE Pharmacy Consult for Heparin Indication: IABP  No Known Allergies  Vital Signs: Temp: 99.1 F (37.3 C) (09/17 0900) Temp Source: Core (Comment) (09/17 0600) BP: 117/72 (09/17 0900) Pulse Rate: 89 (09/17 0900)  Labs:  Recent Labs  08/18/16 1500  08/19/16 0410 08/19/16 0731 08/19/16 1800 08/20/16 0500 08/20/16 0723  HGB 10.2*  --  9.6*  --   --  7.5* 7.7*  HCT 31.5*  --  29.7*  --   --  22.9* 23.9*  PLT 259  --  258  --   --  230 234  HEPARINUNFRC  --   < >  --  0.58 0.22* 0.29*  --   CREATININE 2.77*  --  3.12*  --   --  3.94*  --   < > = values in this interval not displayed.  Estimated Creatinine Clearance: 14.5 mL/min (by C-G formula based on SCr of 3.94 mg/dL (H)).   . sodium chloride 10 mL/hr at 08/20/16 0900  . feeding supplement (VITAL AF 1.2 CAL) 1,000 mL (08/20/16 0900)  . fentaNYL infusion INTRAVENOUS 100 mcg/hr (08/20/16 0900)  . heparin 500 Units/hr (08/20/16 0900)  . insulin (NOVOLIN-R) infusion 5.6 mL/hr at 08/20/16 0900  . midazolam (VERSED) infusion 1 mg/hr (08/20/16 0900)  . norepinephrine (LEVOPHED) Adult infusion 8 mcg/min (08/20/16 0900)  . tirofiban 0.075 mcg/kg/min (08/20/16 0900)  . vasopressin (PITRESSIN) infusion - *FOR SHOCK* 0.03 Units/min (08/20/16 0900)     Assessment: 60 year old female s/p STEMI with cardiogenic shock, now with IABP, pharmacy to dose heparin. Spoke with Dr. Prescott Gum, unable to check P2Y12 for up to 48 hrs after administration of tirofiban.  Not planning for OR today due to possible sepsis.  Tirofiban infusion resumed on 9/16.  Heparin level currently remains in goal range.  No bleeding or complications noted but significant trend down in hgb. Will receive 1 unit PRBC today.    Goal of Therapy:  Heparin level 0.2-0.5 units/ml Monitor platelets by anticoagulation protocol: Yes   Plan:  Continue IV heparin at 500 units/hr Daily heparin level and CBC Monitor closely for signs and symptoms  of bleeding F/u plans for OR  Shamanda Len K. Velva Harman, PharmD, BCPS, CPP Clinical Pharmacist Pager: [phone] Phone: [phone] 08/20/2016 9:35 AM

## 2016-08-20 NOTE — Progress Notes (Signed)
Patient ID: Deborah [name], female   DOB: Aug 15, 1956, 60 y.o.   MRN: [id number]   60 y/o woman with DM2, HTN, HL and noncompliance presented to [facility] ER with infrerior posterior STEMI on 9/12. Taken to cath lab and found to have occluded small to moderate OM-1 branch. Underwent successful PCS with stent x 2 by Dr. Clayborn Bigness. Coronaries otherwise ok. EF 55-60% with severe MR confirmed by echo.   Post-cath developed respiratory failure and required intubation. Developed shock and renal failure with peak creatinine 3.4 (1.6 on admit). Trialysis catheter placed.   Patient persistently acidotic with concern for ischemic MR and transferred here. En route develop severe hypotension and norepinephrine started.   Brought to cath lab 9/15 and TEE showed normal LV (EF 60-65%) and normal RV function with ruptured posterior papillary muscle with severe posterior MR.  Underwent placement of Swan and IABP.  Brilinta stopped 9/15, tirofiban started.   Febrile yesterday as high at 101.5.  Group B Strep in sputum culture.  PCT 5.27.  In setting of fever, BP and SVR dropped, required initiation of vasopressin in addition to norepinephrine.  Now on broad spectrum abx.  Afebrile most of the night and this morning, BP better and coming down on norepinephrine.    IABP remains 1:1, augmenting normally. Still minimal UOP.   Hemoglobin falling, no hematoma at groin site and no other overt bleeding detected.    Swan numbers: RA 10 PA 35/24 PCWP 20 CI 2.5 SVR 1071 Co-ox 81%  Scheduled Meds: . sodium chloride   Intravenous Once  . aspirin  81 mg Oral Daily  . atorvastatin  80 mg Oral Daily  . chlorhexidine gluconate (MEDLINE KIT)  15 mL Mouth Rinse BID  . famotidine (PEPCID) IV  20 mg Intravenous Q24H  . furosemide  120 mg Intravenous Once  . mouth rinse  15 mL Mouth Rinse 10 times per day  . piperacillin-tazobactam (ZOSYN)  IV  2.25 g Intravenous Q8H  . [START ON 08/21/2016] vancomycin  1,000 mg Intravenous Q48H     Continuous Infusions: . sodium chloride 10 mL/hr at 08/20/16 0700  . feeding supplement (VITAL AF 1.2 CAL) 1,000 mL (08/20/16 0700)  . fentaNYL infusion INTRAVENOUS 100 mcg/hr (08/20/16 0700)  . heparin 500 Units/hr (08/20/16 0700)  . insulin (NOVOLIN-R) infusion 1 Units/hr (08/20/16 0722)  . midazolam (VERSED) infusion 1 mg/hr (08/20/16 0700)  . norepinephrine (LEVOPHED) Adult infusion 8 mcg/min (08/20/16 0700)  . tirofiban 0.075 mcg/kg/min (08/20/16 0700)  . vasopressin (PITRESSIN) infusion - *FOR SHOCK* 0.03 Units/min (08/20/16 0700)   PRN Meds:.acetaminophen, fentaNYL (SUBLIMAZE) injection, heparin, midazolam, ondansetron (ZOFRAN) IV, sodium chloride flush    Vitals:   08/20/16 0500 08/20/16 0600 08/20/16 0700 08/20/16 0709  BP: 99/69 98/64 96/73    Pulse: 84 93 88 88  Resp: 18 18 (!) 21 18  Temp: 99 F (37.2 C) 99.3 F (37.4 C) 99.5 F (37.5 C) 99.5 F (37.5 C)  TempSrc: Core (Comment) Core (Comment)    SpO2: 100% 99% 100% 98%  Weight: 171 lb 11.8 oz (77.9 kg)     Height:        Intake/Output Summary (Last 24 hours) at 08/20/16 0745 Last data filed at 08/20/16 0700  Gross per 24 hour  Intake          3381.59 ml  Output              295 ml  Net          3086.59 ml  LABS: Basic Metabolic Panel:  Recent Labs  08/18/16 0518  08/18/16 1500 08/19/16 0410 08/20/16 0500  NA 137  136  < > 135 137 136  K 4.3  4.3  < > 4.7 4.1 4.1  CL 106  105  < > 105 106 106  CO2 27  26  < > 20* 23 22  GLUCOSE 203*  200*  < > 355* 163* 161*  BUN 34*  32*  < > 39* 43* 58*  CREATININE 2.29*  2.31*  < > 2.77* 3.12* 3.94*  CALCIUM 8.4*  8.2*  < > 8.1* 7.7* 7.3*  MG 1.9  --  1.8 1.7 1.9  PHOS 2.2*  2.1*  --  2.7  --   --   < > = values in this interval not displayed. Liver Function Tests:  Recent Labs  08/19/16 0410 08/20/16 0500  AST 40 41  ALT 25 25  ALKPHOS 71 64  BILITOT 0.7 0.5  PROT 5.0* 5.0*  ALBUMIN 1.6* 1.6*    Recent Labs  08/19/16 0410  08/20/16 0500  AMYLASE 222* 185*   CBC:  Recent Labs  08/19/16 0410 08/20/16 0500 08/20/16 0723  WBC 14.6* 12.6* 13.3*  NEUTROABS 10.0* 9.5*  --   HGB 9.6* 7.5* 7.7*  HCT 29.7* 22.9* 23.9*  MCV 85.1 85.4 85.7  PLT 258 230 234   Cardiac Enzymes: No results for input(s): CKTOTAL, CKMB, CKMBINDEX, TROPONINI in the last 72 hours. BNP: Invalid input(s): POCBNP D-Dimer: No results for input(s): DDIMER in the last 72 hours. Hemoglobin A1C: No results for input(s): HGBA1C in the last 72 hours. Fasting Lipid Panel: No results for input(s): CHOL, HDL, LDLCALC, TRIG, CHOLHDL, LDLDIRECT in the last 72 hours. Thyroid Function Tests: No results for input(s): TSH, T4TOTAL, T3FREE, THYROIDAB in the last 72 hours.  Invalid input(s): FREET3 Anemia Panel: No results for input(s): VITAMINB12, FOLATE, FERRITIN, TIBC, IRON, RETICCTPCT in the last 72 hours.  RADIOLOGY: Dg Chest 1 View  Result Date: 08/17/2016 CLINICAL DATA:  Endotracheal tube placement, OG tube placement EXAM: CHEST 1 VIEW COMPARISON:  Portable chest x-ray of 08/17/2016 FINDINGS: The tip of the endotracheal tube is within the right mainstem bronchus and needs to be withdrawn by approximately 5 cm per OG tube extends below the hemidiaphragm. Bilateral primarily perihilar airspace disease again is noted most consistent with edema or possibly aspiration. A small left pleural effusion is noted. Right central venous line is unchanged. IMPRESSION: 1. Endotracheal tip is within the right mainstem bronchus and needs to be withdrawn by approximately 5 cm. 2. OG tube extends below the hemidiaphragm. 3. Little change in bilateral airspace disease with small left pleural effusion. Electronically Signed   By: Ivar Drape M.D.   On: 08/17/2016 08:05   Dg Chest 1 View  Result Date: 08/17/2016 CLINICAL DATA:  Acute respiratory failure. EXAM: CHEST 1 VIEW COMPARISON:  08/15/2016 FINDINGS: Type of the right central line in the mid SVC. The  bilateral perihilar opacities are becoming more confluent, air bronchograms non noted in the right suprahilar region. Stable cardiomegaly. Increasing left lung base atelectasis. Small pleural effusion on prior exam is not well visualized, suspect unchanged. No pneumothorax. IMPRESSION: Coalescing perihilar bilateral opacities, suspicious for increasing pulmonary edema. Increasing left lung base atelectasis. Electronically Signed   By: Jeb Levering M.D.   On: 08/17/2016 03:15   Dg Chest 1 View  Result Date: 08/15/2016 CLINICAL DATA:  Respiratory failure EXAM: CHEST 1 VIEW COMPARISON:  January 22, 2010 FINDINGS: The  mediastinal contour is normal. The heart size is enlarged. The diffuse airspace opacities identified throughout bilateral lungs. There is probably a minimal left pleural effusion. The osseous structures are normal. IMPRESSION: Congestive heart failure. Electronically Signed   By: Abelardo Diesel M.D.   On: 08/15/2016 19:25   Dg Abd 1 View  Result Date: 08/17/2016 CLINICAL DATA:  Endotracheal tube placement, OG tube placement EXAM: ABDOMEN - 1 VIEW COMPARISON:  CT abdomen pelvis of 12/10/2010 FINDINGS: The OG tube tip extends into the body the stomach. The bowel gas pattern is nonspecific. Contrast is noted being excreted by the kidneys and within the urinary bladder secondary to recent CT of the abdomen pelvis. The bowel gas pattern is nonspecific. There may be a small left pleural effusion present. IMPRESSION: OG tube tip extends into the body the stomach. The bowel gas pattern is nonspecific. Electronically Signed   By: Ivar Drape M.D.   On: 08/17/2016 08:09   Dg Chest Port 1 View  Result Date: 08/19/2016 CLINICAL DATA:  Pulmonary edema EXAM: PORTABLE CHEST 1 VIEW COMPARISON:  08/18/2016 FINDINGS: External pad artifact. Endotracheal tube, central venous lines, and NG tube unchanged. Swan-Ganz catheter and intra aortic balloon pump unchanged. Stable cardiac silhouette. Mild central venous  pulmonary congestion. No overt pulmonary edema. No pneumothorax. IMPRESSION: 1. Stable support apparatus. 2. Central venous congestion.  No significant change. Electronically Signed   By: Suzy Bouchard M.D.   On: 08/19/2016 07:55   Dg Chest Port 1 View  Result Date: 08/18/2016 CLINICAL DATA:  Placement of femoral Swan-Ganz catheter and intra-aortic balloon pump catheter. EXAM: PORTABLE CHEST 1 VIEW 4:14 p.m.: COMPARISON:  Portable chest x-ray earlier today 3:37 p.m. and previously. FINDINGS: Femoral Swan-Ganz catheter tip projects at the expected location of the right middle lobe pulmonary artery. Intra-aortic balloon pump catheter tip projects over the proximal descending thoracic aorta. Endotracheal tube tip now projects approximately 2-3 cm above the carina. Right jugular central venous catheter tip projects over the upper SVC. Left jugular central venous catheter tip projects over the lower SVC. Nasogastric tube courses below the diaphragm into the stomach. Improved aeration in the left lower lobe since the most recent examination 45 minutes earlier, though streaky opacities persist. Improved perihilar airspace pulmonary edema in both lungs. No new pulmonary parenchymal abnormalities. IMPRESSION: 1. Swan-Ganz catheter tip projects at the expected location of the right middle lobe pulmonary artery. 2. Intra-aortic balloon pump catheter tip projects over the proximal descending thoracic aorta. 3. Remaining support apparatus satisfactory. 4. Improved aeration in both lungs since the examination performed 45 minutes ago, although streaky left lower lobe atelectasis and/or pneumonia and mild perihilar airspace pulmonary edema persists. 5. No new abnormalities. Electronically Signed   By: Evangeline Dakin M.D.   On: 08/18/2016 16:43   Dg Chest Port 1 View  Result Date: 08/18/2016 CLINICAL DATA:  ST-elevation myocardial infarction and cardiogenic shock. EXAM: PORTABLE CHEST 1 VIEW COMPARISON:  Yesterday  FINDINGS: Endotracheal tube tip 1 cm above the carina. Central line on the right and dialysis catheter on the left with tips at the lower SVC. Orogastric tube reaches the stomach at least. Stable borderline heart size. Worsening aeration at the left base with newly obscured diaphragm. Perihilar airspace disease. IMPRESSION: 1. Lower endotracheal tube with tip 1 cm above the carina. 2. New atelectasis or consolidation in the left lower lobe with small effusion. 3. Pulmonary edema. Electronically Signed   By: Monte Fantasia M.D.   On: 08/18/2016 07:02   Dg Chest  Port 1 View  Result Date: 08/17/2016 CLINICAL DATA:  Central line placement EXAM: PORTABLE CHEST 1 VIEW COMPARISON:  Earlier same day FINDINGS: Endotracheal tube is been withdrawn with the tip 2.5 cm proximal to the carina. Right internal jugular central line remains in place with its tip in the SVC above the right atrium. New dual-lumen left internal jugular central line has its tip in the SVC above the right atrium. No pneumothorax. Bilateral bat wing airspace filling is seen consistent with acute edema or acute airspace filling due to other etiologies. No effusions. No collapse. IMPRESSION: Left IJ central line well position with tip in the SVC above the right atrium. No pneumothorax. Endotracheal tube repositioned, well positioned with tip 2.5 cm above the carina. Persistent worsen bat wing edema or airspace filling pattern. Electronically Signed   By: Nelson Chimes M.D.   On: 08/17/2016 10:35   Dg Chest Port 1 View  Result Date: 08/15/2016 CLINICAL DATA:  Central line placement EXAM: PORTABLE CHEST 1 VIEW COMPARISON:  August 15, 2016 7:04 p.m. FINDINGS: Right central venous line is identified with distal tip in superior vena cava. There is no pneumothorax. The heart size mildly enlarged. Diffuse airspace opacities identified throughout both lungs. The bones are unchanged. IMPRESSION: Right central venous line distal tip in the superior vena  cava. There is no pneumothorax. Pulmonary edema unchanged compared prior exam. Electronically Signed   By: Abelardo Diesel M.D.   On: 08/15/2016 21:10   Dg Abd Portable 1v  Result Date: 08/19/2016 CLINICAL DATA:  Acidosis EXAM: PORTABLE ABDOMEN - 1 VIEW COMPARISON:  08/17/2016 FINDINGS: No dilated loops large or small bowel. NG tube in stomach. Swan-Ganz catheter noted LEFT basilar atelectasis. The kidneys are dense suggesting retained IV contrast . IMPRESSION: 1. Normal bowel-gas pattern. 2. Retained contrast in the kidneys consistent consistent with acute kidney injury. Electronically Signed   By: Suzy Bouchard M.D.   On: 08/19/2016 08:02    PHYSICAL EXAM General: Intubated/sedated Neck: No JVD, no thyromegaly or thyroid nodule.  Lungs: Crackles dependently.  CV: Nondisplaced PMI.  Heart regular S1/S2, no S3/S4, 3/6 HSM apex.  No peripheral edema.   Abdomen: Soft, no hepatosplenomegaly, no distention.  Neurologic: Sedated.   Extremities: No clubbing or cyanosis. Right groin site w/o hematoma.   TELEMETRY: Reviewed telemetry pt in NSR   Assessment:   1. Cardiogenic shock: Due to acute severe MR.  2. CAD: Inferior posterior STEMI on 9/13 with DES x 2 to OM-1  3. 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  4. AKI on CKD stage III - due to ATN likely from cardiogenic shock + contrast with cath. Has required CVVH.  5. Acute respiratory failure: Pulmonary edema.  6. DM2 7. Suspect component of septic shock: Possible PNA, group B Strep in sputum culture.  8. Anemia   Plan/Discussion:     She is critically ill with cardiogenic shock in setting of inferior STEMI c/b cardiogenic shock, severe ischemic MR due to ruptured papillary muscle and AKI.   IABP remains 1:1, good cardiac output.  Minimal UOP in setting of ATN, filling pressures rising but not markedly high.  Febrile yesterday with fall in BP, required  increased pressors.  Suspect component of septic (vasodilatory) shock now.  BP better, no longer febrile.   - Continue IABP, likely will need in given acute severe MR until surgical repair of MR.  Keep Swan in for now.  Have consulted Dr. Lucianne Lei  Trigt in TCTS but need stabilization of current situation prior to any surgical repair.  - Wean off vasopressin this morning, adjust norepinephrine as needed to do this.   PCT elevated, possible PNA/sepsis.  Continue vanco/Zosyn.    Last dose of Brilinta on 9/15, now on tirofiban bridge as we wait for Brilinta wash-out prior to MV surgery.   Progressive anemia.  No definite site for bleeding, groin looks ok.  ?hemolysis though bilirubin normal.  Will transfuse 1 unit today with 120 mg IV Lasix. Repeat CBC in pm.   Creatinine 3.94, minimal UOP yesterday.  Had been on CVVH at [facility].  Renal following, to determine need for ongoing RRT. Filling pressures rising but not markedly high so far.     Continue heparin for IABP.   The patient is critically ill with multiple organ systems failure and requires high complexity decision making for assessment and support, frequent evaluation and titration of therapies, application of advanced monitoring technologies and extensive interpretation of multiple databases.   Critical Care Time devoted to patient care services described in this note is 40 Minutes.  Loralie Champagne 08/20/2016 7:45 AM

## 2016-08-21 ENCOUNTER — Inpatient Hospital Stay (HOSPITAL_COMMUNITY): Payer: Medicaid Other

## 2016-08-21 ENCOUNTER — Encounter (HOSPITAL_COMMUNITY): Payer: Self-pay | Admitting: Internal Medicine

## 2016-08-21 LAB — CBC
HCT: 26.7 % — ABNORMAL LOW (ref 36.0–46.0)
Hemoglobin: 8.6 g/dL — ABNORMAL LOW (ref 12.0–15.0)
MCH: 28.1 pg (ref 26.0–34.0)
MCHC: 32.2 g/dL (ref 30.0–36.0)
MCV: 87.3 fL (ref 78.0–100.0)
PLATELETS: 249 10*3/uL (ref 150–400)
RBC: 3.06 MIL/uL — AB (ref 3.87–5.11)
RDW: 14.6 % (ref 11.5–15.5)
WBC: 14.1 10*3/uL — AB (ref 4.0–10.5)

## 2016-08-21 LAB — RENAL FUNCTION PANEL
ALBUMIN: 1.5 g/dL — AB (ref 3.5–5.0)
Anion gap: 7 (ref 5–15)
BUN: 64 mg/dL — AB (ref 6–20)
CALCIUM: 7.3 mg/dL — AB (ref 8.9–10.3)
CO2: 23 mmol/L (ref 22–32)
Chloride: 105 mmol/L (ref 101–111)
Creatinine, Ser: 4.26 mg/dL — ABNORMAL HIGH (ref 0.44–1.00)
GFR calc Af Amer: 12 mL/min — ABNORMAL LOW (ref >60)
GFR calc non Af Amer: 10 mL/min — ABNORMAL LOW (ref >60)
GLUCOSE: 263 mg/dL — AB (ref 65–99)
PHOSPHORUS: 2.9 mg/dL (ref 2.5–4.6)
POTASSIUM: 4.6 mmol/L (ref 3.5–5.1)
SODIUM: 135 mmol/L (ref 135–145)

## 2016-08-21 LAB — CBC WITH DIFFERENTIAL/PLATELET
BASOS ABS: 0 10*3/uL (ref 0.0–0.1)
BASOS PCT: 0 %
Eosinophils Absolute: 0.2 10*3/uL (ref 0.0–0.7)
Eosinophils Relative: 1 %
HEMATOCRIT: 25.9 % — AB (ref 36.0–46.0)
Hemoglobin: 8.2 g/dL — ABNORMAL LOW (ref 12.0–15.0)
LYMPHS PCT: 6 %
Lymphs Abs: 0.9 10*3/uL (ref 0.7–4.0)
MCH: 27.8 pg (ref 26.0–34.0)
MCHC: 31.7 g/dL (ref 30.0–36.0)
MCV: 87.8 fL (ref 78.0–100.0)
Monocytes Absolute: 2.6 10*3/uL — ABNORMAL HIGH (ref 0.1–1.0)
Monocytes Relative: 19 %
NEUTROS ABS: 10.4 10*3/uL — AB (ref 1.7–7.7)
Neutrophils Relative %: 74 %
PLATELETS: 235 10*3/uL (ref 150–400)
RBC: 2.95 MIL/uL — AB (ref 3.87–5.11)
RDW: 14.6 % (ref 11.5–15.5)
WBC: 14.2 10*3/uL — AB (ref 4.0–10.5)

## 2016-08-21 LAB — GLUCOSE, CAPILLARY
GLUCOSE-CAPILLARY: 271 mg/dL — AB (ref 65–99)
Glucose-Capillary: 148 mg/dL — ABNORMAL HIGH (ref 65–99)
Glucose-Capillary: 157 mg/dL — ABNORMAL HIGH (ref 65–99)
Glucose-Capillary: 212 mg/dL — ABNORMAL HIGH (ref 65–99)
Glucose-Capillary: 220 mg/dL — ABNORMAL HIGH (ref 65–99)
Glucose-Capillary: 247 mg/dL — ABNORMAL HIGH (ref 65–99)
Glucose-Capillary: 268 mg/dL — ABNORMAL HIGH (ref 65–99)
Glucose-Capillary: 275 mg/dL — ABNORMAL HIGH (ref 65–99)

## 2016-08-21 LAB — TYPE AND SCREEN
ABO/RH(D): AB POS
Antibody Screen: NEGATIVE
Unit division: 0

## 2016-08-21 LAB — BLOOD GAS, ARTERIAL
Acid-base deficit: 7 mmol/L — ABNORMAL HIGH (ref 0.0–2.0)
Bicarbonate: 18.8 mmol/L — ABNORMAL LOW (ref 20.0–28.0)
Drawn by: 44138
FIO2: 40
MECHVT: 450 mL
O2 Saturation: 91.6 %
PEEP: 5 cmH2O
Patient temperature: 98.6
RATE: 18 resp/min
pCO2 arterial: 43.4 mmHg (ref 32.0–48.0)
pH, Arterial: 7.26 — ABNORMAL LOW (ref 7.350–7.450)
pO2, Arterial: 69.5 mmHg — ABNORMAL LOW (ref 83.0–108.0)

## 2016-08-21 LAB — MAGNESIUM: MAGNESIUM: 1.9 mg/dL (ref 1.7–2.4)

## 2016-08-21 LAB — COMPREHENSIVE METABOLIC PANEL
ALBUMIN: 1.5 g/dL — AB (ref 3.5–5.0)
ALT: 26 U/L (ref 14–54)
AST: 40 U/L (ref 15–41)
Alkaline Phosphatase: 74 U/L (ref 38–126)
Anion gap: 10 (ref 5–15)
BILIRUBIN TOTAL: 0.5 mg/dL (ref 0.3–1.2)
BUN: 69 mg/dL — AB (ref 6–20)
CHLORIDE: 105 mmol/L (ref 101–111)
CO2: 20 mmol/L — ABNORMAL LOW (ref 22–32)
CREATININE: 4.96 mg/dL — AB (ref 0.44–1.00)
Calcium: 7.4 mg/dL — ABNORMAL LOW (ref 8.9–10.3)
GFR calc Af Amer: 10 mL/min — ABNORMAL LOW (ref >60)
GFR, EST NON AFRICAN AMERICAN: 9 mL/min — AB (ref >60)
GLUCOSE: 272 mg/dL — AB (ref 65–99)
Potassium: 5 mmol/L (ref 3.5–5.1)
Sodium: 135 mmol/L (ref 135–145)
Total Protein: 5.1 g/dL — ABNORMAL LOW (ref 6.5–8.1)

## 2016-08-21 LAB — AMYLASE: Amylase: 262 U/L — ABNORMAL HIGH (ref 28–100)

## 2016-08-21 LAB — CARBOXYHEMOGLOBIN
CARBOXYHEMOGLOBIN: 1.2 % (ref 0.5–1.5)
METHEMOGLOBIN: 0.9 % (ref 0.0–1.5)
O2 Saturation: 82 %
TOTAL HEMOGLOBIN: 8.3 g/dL — AB (ref 12.0–16.0)

## 2016-08-21 LAB — HEPARIN LEVEL (UNFRACTIONATED): Heparin Unfractionated: 0.24 IU/mL — ABNORMAL LOW (ref 0.30–0.70)

## 2016-08-21 LAB — PROCALCITONIN: Procalcitonin: 4.5 ng/mL

## 2016-08-21 MED ORDER — CEFTRIAXONE SODIUM 2 G IJ SOLR
2.0000 g | INTRAMUSCULAR | Status: DC
  Administered 2016-08-21 – 2016-08-25 (×5): 2 g via INTRAVENOUS
  Filled 2016-08-21 (×6): qty 2

## 2016-08-21 MED ORDER — INSULIN ASPART 100 UNIT/ML ~~LOC~~ SOLN
0.0000 [IU] | SUBCUTANEOUS | Status: DC
  Administered 2016-08-21: 5 [IU] via SUBCUTANEOUS
  Administered 2016-08-21 (×2): 3 [IU] via SUBCUTANEOUS
  Administered 2016-08-21 – 2016-08-22 (×2): 5 [IU] via SUBCUTANEOUS
  Administered 2016-08-22: 2 [IU] via SUBCUTANEOUS
  Administered 2016-08-22: 3 [IU] via SUBCUTANEOUS
  Administered 2016-08-22 – 2016-08-23 (×3): 2 [IU] via SUBCUTANEOUS
  Administered 2016-08-23: 3 [IU] via SUBCUTANEOUS
  Administered 2016-08-24: 2 [IU] via SUBCUTANEOUS
  Administered 2016-08-25 (×2): 4 [IU] via SUBCUTANEOUS

## 2016-08-21 MED ORDER — PRISMASOL BGK 4/2.5 32-4-2.5 MEQ/L IV SOLN
INTRAVENOUS | Status: DC
  Administered 2016-08-21 – 2016-08-23 (×8): via INTRAVENOUS_CENTRAL
  Filled 2016-08-21 (×16): qty 5000

## 2016-08-21 MED ORDER — INSULIN GLARGINE 100 UNIT/ML ~~LOC~~ SOLN
35.0000 [IU] | Freq: Every day | SUBCUTANEOUS | Status: DC
  Filled 2016-08-21: qty 0.35

## 2016-08-21 MED ORDER — PANTOPRAZOLE SODIUM 40 MG PO PACK
40.0000 mg | PACK | ORAL | Status: DC
  Administered 2016-08-21 – 2016-08-22 (×2): 40 mg
  Filled 2016-08-21 (×2): qty 20

## 2016-08-21 MED ORDER — PRISMASOL BGK 4/2.5 32-4-2.5 MEQ/L IV SOLN
INTRAVENOUS | Status: DC
  Administered 2016-08-21 – 2016-08-22 (×2): via INTRAVENOUS_CENTRAL
  Filled 2016-08-21 (×3): qty 5000

## 2016-08-21 MED ORDER — INSULIN GLARGINE 100 UNIT/ML ~~LOC~~ SOLN
35.0000 [IU] | Freq: Every day | SUBCUTANEOUS | Status: DC
  Administered 2016-08-21: 35 [IU] via SUBCUTANEOUS
  Filled 2016-08-21 (×2): qty 0.35

## 2016-08-21 MED ORDER — PRISMASOL BGK 4/2.5 32-4-2.5 MEQ/L IV SOLN
INTRAVENOUS | Status: DC
  Administered 2016-08-21 – 2016-08-22 (×3): via INTRAVENOUS_CENTRAL
  Filled 2016-08-21 (×5): qty 5000

## 2016-08-21 MED ORDER — HEPARIN SODIUM (PORCINE) 1000 UNIT/ML DIALYSIS
1000.0000 [IU] | INTRAMUSCULAR | Status: DC | PRN
  Administered 2016-08-23: 2600 [IU] via INTRAVENOUS_CENTRAL
  Administered 2016-08-23: 2300 [IU] via INTRAVENOUS_CENTRAL
  Filled 2016-08-21 (×3): qty 6

## 2016-08-21 MED ORDER — SODIUM CHLORIDE 0.9 % FOR CRRT
INTRAVENOUS_CENTRAL | Status: DC | PRN
  Administered 2016-08-21: 11:00:00 via INTRAVENOUS_CENTRAL
  Filled 2016-08-21 (×2): qty 1000

## 2016-08-21 MED ORDER — INSULIN GLARGINE 100 UNIT/ML ~~LOC~~ SOLN
5.0000 [IU] | SUBCUTANEOUS | Status: AC
  Administered 2016-08-21: 5 [IU] via SUBCUTANEOUS
  Filled 2016-08-21: qty 0.05

## 2016-08-21 MED FILL — Midazolam HCl Inj 2 MG/2ML (Base Equivalent): INTRAMUSCULAR | Qty: 2 | Status: AC

## 2016-08-21 NOTE — Progress Notes (Addendum)
Patient ID: Deborah [name], female   DOB: 03-30-1956, 60 y.o.   MRN: [id number]   60 y/o woman with DM2, HTN, HL and noncompliance presented to [facility] ER with infrerior posterior STEMI on 9/12. Taken to cath lab and found to have occluded small to moderate OM-1 branch. Underwent successful PCS with stent x 2 by Dr. Clayborn Bigness. Coronaries otherwise ok. EF 55-60% with severe MR confirmed by echo.   Post-cath developed respiratory failure and required intubation. Developed shock and renal failure with peak creatinine 3.4 (1.6 on admit). Trialysis catheter placed.   Patient persistently acidotic with concern for ischemic MR and transferred here. En route develop severe hypotension and norepinephrine started.   Brought to cath lab 9/15 and TEE showed normal LV (EF 60-65%) and normal RV function with ruptured posterior papillary muscle with severe posterior MR.  Underwent placement of Swan and IABP.  Brilinta stopped 9/15, tirofiban started.   Group B Strep in sputum culture, on broad spectrum abx.  Fever to 100.2 yesterday.  PCT 5.27.  In setting of fever,   She is off vasopressin, on norepinephrine at 10.    IABP remains 1:1, augmenting normally. 715 cc UOP yesterday with Lasix 120 mg IV x 1 given with blood.    No overt bleeding.  Got 1 unit PRBCs yesterday, hgb 8.2.    Per nurse, able to squeeze hand when sedation weaned.   Swan numbers: RA 10 PA 41/30 PCWP 16 CI 2.9 SVR 731 Co-ox 82%  Scheduled Meds: . aspirin  81 mg Oral Daily  . atorvastatin  80 mg Oral Daily  . chlorhexidine gluconate (MEDLINE KIT)  15 mL Mouth Rinse BID  . famotidine (PEPCID) IV  20 mg Intravenous Q24H  . insulin aspart  2-6 Units Subcutaneous Q4H  . insulin aspart  5 Units Subcutaneous Q4H  . insulin glargine  30 Units Subcutaneous Q24H  . mouth rinse  15 mL Mouth Rinse 10 times per day  . piperacillin-tazobactam (ZOSYN)  IV  2.25 g Intravenous Q8H  . vancomycin  1,000 mg Intravenous Q48H   Continuous  Infusions: . sodium chloride 10 mL/hr at 08/20/16 1900  . dextrose    . feeding supplement (VITAL AF 1.2 CAL) 1,000 mL (08/21/16 0029)  . fentaNYL infusion INTRAVENOUS 125 mcg/hr (08/21/16 0312)  . heparin 500 Units/hr (08/20/16 1900)  . midazolam (VERSED) infusion 1 mg/hr (08/20/16 1900)  . norepinephrine (LEVOPHED) Adult infusion 10 mcg/min (08/21/16 7673)  . tirofiban 0.075 mcg/kg/min (08/20/16 2038)  . vasopressin (PITRESSIN) infusion - *FOR SHOCK* Stopped (08/20/16 0930)   PRN Meds:.acetaminophen, dextrose, fentaNYL (SUBLIMAZE) injection, heparin, midazolam, ondansetron (ZOFRAN) IV, sodium chloride flush    Vitals:   08/21/16 0400 08/21/16 0446 08/21/16 0500 08/21/16 0600  BP: (!) 87/72  (!) 80/60 (!) 82/66  Pulse: 96 99 92 96  Resp: _0 Temp: 100 F (37.8 C) 99.5 F (37.5 C) 99.7 F (37.6 C) 99 F (37.2 C)  TempSrc: Core (Comment)  Core (Comment) Core (Comment)  SpO2: 96% 96% 95% 96%  Weight:   173 lb 8 oz (78.7 kg)   Height:        Intake/Output Summary (Last 24 hours) at 08/21/16 0756 Last data filed at 08/21/16 0629  Gross per 24 hour  Intake          3406.13 ml  Output              715 ml  Net  3875.64 ml    LABS: Basic Metabolic Panel:  Recent Labs  08/18/16 1500  08/20/16 0500 08/21/16 0436  NA 135  < > 136 135  K 4.7  < > 4.1 5.0  CL 105  < > 106 105  CO2 20*  < > 22 20*  GLUCOSE 355*  < > 161* 272*  BUN 39*  < > 58* 69*  CREATININE 2.77*  < > 3.94* 4.96*  CALCIUM 8.1*  < > 7.3* 7.4*  MG 1.8  < > 1.9 1.9  PHOS 2.7  --   --   --   < > = values in this interval not displayed. Liver Function Tests:  Recent Labs  08/20/16 0500 08/21/16 0436  AST 41 40  ALT 25 26  ALKPHOS 64 74  BILITOT 0.5 0.5  PROT 5.0* 5.1*  ALBUMIN 1.6* 1.5*    Recent Labs  08/20/16 0500 08/21/16 0436  AMYLASE 185* 262*   CBC:  Recent Labs  08/20/16 0500  08/20/16 1329 08/21/16 0436  WBC 12.6*  < > 13.1* 14.2*  NEUTROABS 9.5*  --   --   10.4*  HGB 7.5*  < > 8.5* 8.2*  HCT 22.9*  < > 26.4* 25.9*  MCV 85.4  < > 87.7 87.8  PLT 230  < > 216 235  < > = values in this interval not displayed. Cardiac Enzymes: No results for input(s): CKTOTAL, CKMB, CKMBINDEX, TROPONINI in the last 72 hours. BNP: Invalid input(s): POCBNP D-Dimer: No results for input(s): DDIMER in the last 72 hours. Hemoglobin A1C: No results for input(s): HGBA1C in the last 72 hours. Fasting Lipid Panel: No results for input(s): CHOL, HDL, LDLCALC, TRIG, CHOLHDL, LDLDIRECT in the last 72 hours. Thyroid Function Tests: No results for input(s): TSH, T4TOTAL, T3FREE, THYROIDAB in the last 72 hours.  Invalid input(s): FREET3 Anemia Panel: No results for input(s): VITAMINB12, FOLATE, FERRITIN, TIBC, IRON, RETICCTPCT in the last 72 hours.  RADIOLOGY: Dg Chest 1 View  Result Date: 08/17/2016 CLINICAL DATA:  Endotracheal tube placement, OG tube placement EXAM: CHEST 1 VIEW COMPARISON:  Portable chest x-ray of 08/17/2016 FINDINGS: The tip of the endotracheal tube is within the right mainstem bronchus and needs to be withdrawn by approximately 5 cm per OG tube extends below the hemidiaphragm. Bilateral primarily perihilar airspace disease again is noted most consistent with edema or possibly aspiration. A small left pleural effusion is noted. Right central venous line is unchanged. IMPRESSION: 1. Endotracheal tip is within the right mainstem bronchus and needs to be withdrawn by approximately 5 cm. 2. OG tube extends below the hemidiaphragm. 3. Little change in bilateral airspace disease with small left pleural effusion. Electronically Signed   By: Ivar Drape M.D.   On: 08/17/2016 08:05   Dg Chest 1 View  Result Date: 08/17/2016 CLINICAL DATA:  Acute respiratory failure. EXAM: CHEST 1 VIEW COMPARISON:  08/15/2016 FINDINGS: Type of the right central line in the mid SVC. The bilateral perihilar opacities are becoming more confluent, air bronchograms non noted in the  right suprahilar region. Stable cardiomegaly. Increasing left lung base atelectasis. Small pleural effusion on prior exam is not well visualized, suspect unchanged. No pneumothorax. IMPRESSION: Coalescing perihilar bilateral opacities, suspicious for increasing pulmonary edema. Increasing left lung base atelectasis. Electronically Signed   By: Jeb Levering M.D.   On: 08/17/2016 03:15   Dg Chest 1 View  Result Date: 08/15/2016 CLINICAL DATA:  Respiratory failure EXAM: CHEST 1 VIEW COMPARISON:  January 22, 2010  FINDINGS: The mediastinal contour is normal. The heart size is enlarged. The diffuse airspace opacities identified throughout bilateral lungs. There is probably a minimal left pleural effusion. The osseous structures are normal. IMPRESSION: Congestive heart failure. Electronically Signed   By: Abelardo Diesel M.D.   On: 08/15/2016 19:25   Dg Abd 1 View  Result Date: 08/17/2016 CLINICAL DATA:  Endotracheal tube placement, OG tube placement EXAM: ABDOMEN - 1 VIEW COMPARISON:  CT abdomen pelvis of 12/10/2010 FINDINGS: The OG tube tip extends into the body the stomach. The bowel gas pattern is nonspecific. Contrast is noted being excreted by the kidneys and within the urinary bladder secondary to recent CT of the abdomen pelvis. The bowel gas pattern is nonspecific. There may be a small left pleural effusion present. IMPRESSION: OG tube tip extends into the body the stomach. The bowel gas pattern is nonspecific. Electronically Signed   By: Ivar Drape M.D.   On: 08/17/2016 08:09   Dg Chest Port 1 View  Result Date: 08/21/2016 CLINICAL DATA:  Acute hypoxemic respiratory failure EXAM: PORTABLE CHEST 1 VIEW COMPARISON:  08/20/2016 FINDINGS: Support devices are stable. Endotracheal tube remains at the level of the carina. Borderline heart size. Bilateral airspace opacities are again noted, likely edema. Findings stable or slightly worsened since prior study. Possible small right effusion. IMPRESSION:  Slight interval worsening in pulmonary edema pattern. Small right effusion. Endotracheal tube remains at the level of the carina and could be retracted 2-3 cm for optimal positioning. Electronically Signed   By: Rolm Baptise M.D.   On: 08/21/2016 07:40   Dg Chest Port 1 View  Result Date: 08/20/2016 CLINICAL DATA:  Pt was hypotensive with respiratory failure x a few days ago. Hx of DM, and HTN. Pt had a cardiac cath on 08-15-16. EXAM: PORTABLE CHEST 1 VIEW COMPARISON:  08/19/2016 FINDINGS: Endotracheal to appears low at the level of the carina. LEFT and RIGHT central venous line are unchanged. Swan-Ganz catheter from an inferior approach unchanged. There is some improvement aeration lung bases. Mild pulmonary edema remains. No pneumothorax. IMPRESSION: 1. Endotracheal tube appears low at the level the carina. 2. Stable support apparatus. 3. Slight improved aeration lung bases with persistent mild edema. Electronically Signed   By: Suzy Bouchard M.D.   On: 08/20/2016 07:58   Dg Chest Port 1 View  Result Date: 08/19/2016 CLINICAL DATA:  Pulmonary edema EXAM: PORTABLE CHEST 1 VIEW COMPARISON:  08/18/2016 FINDINGS: External pad artifact. Endotracheal tube, central venous lines, and NG tube unchanged. Swan-Ganz catheter and intra aortic balloon pump unchanged. Stable cardiac silhouette. Mild central venous pulmonary congestion. No overt pulmonary edema. No pneumothorax. IMPRESSION: 1. Stable support apparatus. 2. Central venous congestion.  No significant change. Electronically Signed   By: Suzy Bouchard M.D.   On: 08/19/2016 07:55   Dg Chest Port 1 View  Result Date: 08/18/2016 CLINICAL DATA:  Placement of femoral Swan-Ganz catheter and intra-aortic balloon pump catheter. EXAM: PORTABLE CHEST 1 VIEW 4:14 p.m.: COMPARISON:  Portable chest x-ray earlier today 3:37 p.m. and previously. FINDINGS: Femoral Swan-Ganz catheter tip projects at the expected location of the right middle lobe pulmonary artery.  Intra-aortic balloon pump catheter tip projects over the proximal descending thoracic aorta. Endotracheal tube tip now projects approximately 2-3 cm above the carina. Right jugular central venous catheter tip projects over the upper SVC. Left jugular central venous catheter tip projects over the lower SVC. Nasogastric tube courses below the diaphragm into the stomach. Improved aeration in the left lower  lobe since the most recent examination 45 minutes earlier, though streaky opacities persist. Improved perihilar airspace pulmonary edema in both lungs. No new pulmonary parenchymal abnormalities. IMPRESSION: 1. Swan-Ganz catheter tip projects at the expected location of the right middle lobe pulmonary artery. 2. Intra-aortic balloon pump catheter tip projects over the proximal descending thoracic aorta. 3. Remaining support apparatus satisfactory. 4. Improved aeration in both lungs since the examination performed 45 minutes ago, although streaky left lower lobe atelectasis and/or pneumonia and mild perihilar airspace pulmonary edema persists. 5. No new abnormalities. Electronically Signed   By: Evangeline Dakin M.D.   On: 08/18/2016 16:43   Dg Chest Port 1 View  Result Date: 08/18/2016 CLINICAL DATA:  ST-elevation myocardial infarction and cardiogenic shock. EXAM: PORTABLE CHEST 1 VIEW COMPARISON:  Yesterday FINDINGS: Endotracheal tube tip 1 cm above the carina. Central line on the right and dialysis catheter on the left with tips at the lower SVC. Orogastric tube reaches the stomach at least. Stable borderline heart size. Worsening aeration at the left base with newly obscured diaphragm. Perihilar airspace disease. IMPRESSION: 1. Lower endotracheal tube with tip 1 cm above the carina. 2. New atelectasis or consolidation in the left lower lobe with small effusion. 3. Pulmonary edema. Electronically Signed   By: Monte Fantasia M.D.   On: 08/18/2016 07:02   Dg Chest Port 1 View  Result Date:  08/17/2016 CLINICAL DATA:  Central line placement EXAM: PORTABLE CHEST 1 VIEW COMPARISON:  Earlier same day FINDINGS: Endotracheal tube is been withdrawn with the tip 2.5 cm proximal to the carina. Right internal jugular central line remains in place with its tip in the SVC above the right atrium. New dual-lumen left internal jugular central line has its tip in the SVC above the right atrium. No pneumothorax. Bilateral bat wing airspace filling is seen consistent with acute edema or acute airspace filling due to other etiologies. No effusions. No collapse. IMPRESSION: Left IJ central line well position with tip in the SVC above the right atrium. No pneumothorax. Endotracheal tube repositioned, well positioned with tip 2.5 cm above the carina. Persistent worsen bat wing edema or airspace filling pattern. Electronically Signed   By: Nelson Chimes M.D.   On: 08/17/2016 10:35   Dg Chest Port 1 View  Result Date: 08/15/2016 CLINICAL DATA:  Central line placement EXAM: PORTABLE CHEST 1 VIEW COMPARISON:  August 15, 2016 7:04 p.m. FINDINGS: Right central venous line is identified with distal tip in superior vena cava. There is no pneumothorax. The heart size mildly enlarged. Diffuse airspace opacities identified throughout both lungs. The bones are unchanged. IMPRESSION: Right central venous line distal tip in the superior vena cava. There is no pneumothorax. Pulmonary edema unchanged compared prior exam. Electronically Signed   By: Abelardo Diesel M.D.   On: 08/15/2016 21:10   Dg Abd Portable 1v  Result Date: 08/19/2016 CLINICAL DATA:  Acidosis EXAM: PORTABLE ABDOMEN - 1 VIEW COMPARISON:  08/17/2016 FINDINGS: No dilated loops large or small bowel. NG tube in stomach. Swan-Ganz catheter noted LEFT basilar atelectasis. The kidneys are dense suggesting retained IV contrast . IMPRESSION: 1. Normal bowel-gas pattern. 2. Retained contrast in the kidneys consistent consistent with acute kidney injury. Electronically  Signed   By: Suzy Bouchard M.D.   On: 08/19/2016 08:02    PHYSICAL EXAM General: Intubated/sedated Neck: No JVD, no thyromegaly or thyroid nodule.  Lungs: Crackles dependently.  CV: Nondisplaced PMI.  Heart regular S1/S2, no S3/S4, 3/6 HSM apex.  No peripheral edema.  Abdomen: Soft, no hepatosplenomegaly, no distention.  Neurologic: Sedated.   Extremities: No clubbing or cyanosis. Right groin site w/o hematoma.   TELEMETRY: Reviewed telemetry pt in NSR   Assessment:   1. Cardiogenic shock: Due to acute severe MR.  2. CAD: Inferior posterior STEMI on 9/13 with DES x 2 to OM-1  3. 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  4. AKI on CKD stage III - due to ATN likely from cardiogenic shock + contrast with cath. Has required CVVH.  5. Acute respiratory failure: Pulmonary edema.  6. DM2 7. Suspect component of septic shock: Possible PNA, group B Strep in sputum culture.  8. Anemia: Transfused 1 unit 9/17.    Plan/Discussion:     She is critically ill with cardiogenic shock in setting of inferior STEMI c/b cardiogenic shock, severe ischemic MR due to ruptured papillary muscle and AKI.   IABP remains 1:1, good cardiac output.  Minimal UOP still in setting of ATN, filling pressures rising but not markedly high.  Still requiring norepinephrine at 10.  Suspect component of septic (vasodilatory) shock now (low grade fever still to 100.2 max, Strep PNA).     - Continue IABP, likely will need in given acute severe MR until surgical repair of MR.  Keep Swan in for now.  Have consulted Dr. Prescott Gum in [facility], plan for surgical repair when patient stable.  - Wean off vasopressin this morning, adjust norepinephrine as needed to do this.   PCT elevated, possible Strep PNA/sepsis.  Continue vanco/Zosyn.    Last dose of Brilinta on 9/15, now on tirofiban bridge as we wait for Brilinta wash-out prior to MV surgery.    Anemia.  No definite site for bleeding, groin looks ok.  ?hemolysis though bilirubin normal.  Got 1 unit PRBCs 9/17.  Follow CBC bid for now.   Creatinine 4.96, UOP 715 yesterday (got 120 mg IV Lasix bolus).  Had been on CV[facility]H at [facility].  Renal following, to determine need for ongoing RRT. Filling pressures rising but not markedly high so far.  Suspect will need CVVH prior to cardiac surgery for hemodynamic maximization.     Continue heparin for IABP.   The patient is critically ill with multiple organ systems failure and requires high complexity decision making for assessment and support, frequent evaluation and titration of therapies, application of advanced monitoring technologies and extensive interpretation of multiple databases.   Critical Care Time devoted to patient care services described in this note is 40 Minutes.  Loralie Champagne 08/21/2016 7:56 AM   Discussed with Dr Lorrene Reid, to start CVVH today.  Aim for 75cc-100cc UF.   Loralie Champagne  08/21/2016

## 2016-08-21 NOTE — Progress Notes (Signed)
Corrected ABG results of:    PH 7.260  pCO2 43.4 pO2 69.5 sO2 91.6 Base deficit 7.0,   Reported to [place],S RN by Isaac,D RRT at [phone]

## 2016-08-21 NOTE — Progress Notes (Signed)
[place] Progress Note Patient Name: Deborah [name] DOB: 24-Oct-1956 MRN: [id number]   Date of Service  08/21/2016  HPI/Events of Note  Camera check on the patient.  Pt seen comfortable, sedated.  Just off levophed drip.  On CVVH. Still has IABP 1: 1 93/37,  102,  20,  100%  eICU Interventions  Cont Current management.         [name] 08/21/2016, 3:41 PM

## 2016-08-21 NOTE — Progress Notes (Signed)
ANTICOAGULATION CONSULT NOTE Pharmacy Consult for Heparin Indication: IABP  Assessment: 60 year old female s/p STEMI with cardiogenic shock, now with IABP, pharmacy to dose heparin. Spoke with Dr. Prescott Gum, unable to check P2Y12 for up to 48 hrs after administration of tirofiban. Tirofiban infusion resumed on 9/16.  Heparin level currently remains in goal range.  No bleeding or complications noted, hgb stable overnight after 1 unit received over weekend.   Group B strep pneumonia. Will narrow antibiotics to ceftriaxone, no dose adjustments needed with CRRT.  Goal of Therapy:  Heparin level 0.2-0.5 units/ml Monitor platelets by anticoagulation protocol: Yes   Plan:  Continue IV heparin at 500 units/hr Daily heparin level and CBC Monitor closely for signs and symptoms of bleeding F/u plans for OR Ceftriaxone 2g IV q24 hours   No Known Allergies  Vital Signs: Temp: 98.8 F (37.1 C) (09/18 1100) Temp Source: Core (Comment) (09/18 0800) BP: 103/83 (09/18 1100) Pulse Rate: 92 (09/18 1100)  Labs:  Recent Labs  08/19/16 0410  08/19/16 1800 08/20/16 0500 08/20/16 0723 08/20/16 1329 08/21/16 0435 08/21/16 0436  HGB 9.6*  --   --  7.5* 7.7* 8.5*  --  8.2*  HCT 29.7*  --   --  22.9* 23.9* 26.4*  --  25.9*  PLT 258  --   --  230 234 216  --  235  HEPARINUNFRC  --   < > 0.22* 0.29*  --   --  0.24*  --   CREATININE 3.12*  --   --  3.94*  --   --   --  4.96*  < > = values in this interval not displayed.  Estimated Creatinine Clearance: 11.6 mL/min (by C-G formula based on SCr of 4.96 mg/dL (H)).   . sodium chloride 10 mL/hr at 08/21/16 0900  . feeding supplement (VITAL AF 1.2 CAL) 1,000 mL (08/21/16 0900)  . fentaNYL infusion INTRAVENOUS 50 mcg/hr (08/21/16 1100)  . heparin 500 Units/hr (08/21/16 0900)  . midazolam (VERSED) infusion Stopped (08/21/16 0845)  . norepinephrine (LEVOPHED) Adult infusion 6 mcg/min (08/21/16 1100)  . dialysis replacement fluid (prismasate) 300  mL/hr at 08/21/16 1036  . dialysis replacement fluid (prismasate)    . dialysate (PRISMASATE) 1,000 mL/hr at 08/21/16 1036  . tirofiban 0.075 mcg/kg/min (08/21/16 1100)  . vasopressin (PITRESSIN) infusion - *FOR SHOCK* Stopped (08/20/16 0930)   Erin Hearing PharmD., BCPS Clinical Pharmacist Pager [phone] 08/21/2016 11:24 AM

## 2016-08-21 NOTE — Progress Notes (Signed)
[facility] Progress Note    Subjective:    Persistent fevers to 100.2 past 24 hours, afebrile this AM Sputum with group B strep ->V/Z 24 hour UOP only 715 ml (after lasix 120), intake 3.4 liters Got a unit of PRBC's yesterday for Hb 8.2 Off vaso, remains on norepi at 10 Dr. Prescott Gum favors initiation of CRRT with upcoming valve surgery    Objective:   BP (!) 82/66   Pulse 96   Temp 99 F (37.2 C) (Core (Comment))   Resp 18   Ht 5' 1"  (1.549 m) Comment: from 08/15/16 encounter  Wt 78.7 kg (173 lb 8 oz)   SpO2 96%   BMI 32.78 kg/m   Intake/Output Summary (Last 24 hours) at 08/21/16 0814 Last data filed at 08/21/16 0629  Gross per 24 hour  Intake          3278.76 ml  Output              715 ml  Net          2563.76 ml   Weight trending 08/21/16 0500  78.7 kg (173 lb    08/20/16 0500  77.9 kg (171 lb    08/19/16 0500  72.5 kg (159 lb 1   08/18/16 1709  71.2 kg (156 lb 1   08/18/16 1457  72.9 kg (160 lb 1     Physical Exam: Intubated, sedated, on IABP 1:1 BP (!) 82/66   Pulse 96   Temp 99 F (37.2 C) (Core (Comment))   Resp 18   Ht 5' 1"  (1.549 m) Comment: from 08/15/16 encounter  Wt 78.7 kg (173 lb 8 oz)   SpO2 96%   BMI 32.78 kg/m   CVP 8 PAP 33/22 PCWP 16 CI 2.9 VS as noted R triple lumen, left HD cath (placed 9/14 at [facility]), left fem art line, R fem IAPB Regular tachycardia, 3/6 pansystolic murmur at apex (heard well with pausing IABP) Lungs ant clear Abd: soft, obese, nontender Trace-1+ edema bilat LE's Dressings on both heels Foley small amount clear yellow urine  Imaging: Dg Chest Port 1 View  Result Date: 08/21/2016 CLINICAL DATA:  Acute hypoxemic respiratory failure EXAM: PORTABLE CHEST 1 VIEW COMPARISON:  08/20/2016 FINDINGS: Support devices are stable. Endotracheal tube remains at the level of the carina. Borderline heart size. Bilateral airspace opacities are again noted, likely edema. Findings stable or slightly worsened  since prior study. Possible small right effusion. IMPRESSION: Slight interval worsening in pulmonary edema pattern. Small right effusion. Endotracheal tube remains at the level of the carina and could be retracted 2-3 cm for optimal positioning. Electronically Signed   By: Rolm Baptise M.D.   On: 08/21/2016 07:40   Dg Chest Port 1 View  Result Date: 08/20/2016 CLINICAL DATA:  Pt was hypotensive with respiratory failure x a few days ago. Hx of DM, and HTN. Pt had a cardiac cath on 08-15-16. EXAM: PORTABLE CHEST 1 VIEW COMPARISON:  08/19/2016 FINDINGS: Endotracheal to appears low at the level of the carina. LEFT and RIGHT central venous line are unchanged. Swan-Ganz catheter from an inferior approach unchanged. There is some improvement aeration lung bases. Mild pulmonary edema remains. No pneumothorax. IMPRESSION: 1. Endotracheal tube appears low at the level the carina. 2. Stable support apparatus. 3. Slight improved aeration lung bases with persistent mild edema. Electronically Signed   By: Suzy Bouchard M.D.   On: 08/20/2016 07:58    Labs: BMET  Recent Labs Lab 08/16/16 1737  08/17/16 0431 08/17/16 1600 08/17/16 1601 08/18/16 0518 08/18/16 0911 08/18/16 1500 08/19/16 0410 08/20/16 0500 08/21/16 0436  NA 137 138 129*  --  137  136 135 135 137 136 135  K 4.1 3.1* 3.8  --  4.3  4.3 4.3 4.7 4.1 4.1 5.0  CL 106 114* 103  --  106  105 104 105 106 106 105  CO2 20* 13* 20*  --  27  26 24  20* 23 22 20*  GLUCOSE 160* 195* 423*  --  203*  200* 210* 355* 163* 161* 272*  BUN 34* 38* 37*  --  34*  32* 31* 39* 43* 58* 69*  CREATININE 2.87* 3.24* 3.38*  --  2.29*  2.31* 2.09* 2.77* 3.12* 3.94* 4.96*  CALCIUM 8.5* 6.7* 7.6*  --  8.4*  8.2* 8.0* 8.1* 7.7* 7.3* 7.4*  PHOS 4.3 5.0* 4.0 4.2 2.2*  2.1*  --  2.7  --   --   --    CBC  Recent Labs Lab 08/18/16 1500 08/19/16 0410 08/20/16 0500 08/20/16 0723 08/20/16 1329 08/21/16 0436  WBC 17.4* 14.6* 12.6* 13.3* 13.1* 14.2*  NEUTROABS  14.4* 10.0* 9.5*  --   --  10.4*  HGB 10.2* 9.6* 7.5* 7.7* 8.5* 8.2*  HCT 31.5* 29.7* 22.9* 23.9* 26.4* 25.9*  MCV 86.5 85.1 85.4 85.7 87.7 87.8  PLT 259 258 230 234 216 235    Medications:    . aspirin  81 mg Oral Daily  . atorvastatin  80 mg Oral Daily  . chlorhexidine gluconate (MEDLINE KIT)  15 mL Mouth Rinse BID  . famotidine (PEPCID) IV  20 mg Intravenous Q24H  . insulin aspart  2-6 Units Subcutaneous Q4H  . insulin aspart  5 Units Subcutaneous Q4H  . insulin glargine  30 Units Subcutaneous Q24H  . mouth rinse  15 mL Mouth Rinse 10 times per day  . piperacillin-tazobactam (ZOSYN)  IV  2.25 g Intravenous Q8H  . vancomycin  1,000 mg Intravenous Q48H   Infusions:  . sodium chloride 10 mL/hr at 08/20/16 1900  . dextrose    . feeding supplement (VITAL AF 1.2 CAL) 1,000 mL (08/21/16 0029)  . fentaNYL infusion INTRAVENOUS 125 mcg/hr (08/21/16 0312)  . heparin 500 Units/hr (08/20/16 1900)  . midazolam (VERSED) infusion 1 mg/hr (08/20/16 1900)  . norepinephrine (LEVOPHED) Adult infusion 10 mcg/min (08/21/16 0981)  . tirofiban 0.075 mcg/kg/min (08/20/16 2038)  . vasopressin (PITRESSIN) infusion - *FOR SHOCK* Stopped (08/20/16 0930)   Background:  60 y/o woman with DM2, HTN, HL and noncompliance presented to [facility] ER with infrerior posterior STEMI on 9/12->cath->. PCS with stent x 2 by Dr. Clayborn Bigness. EF 55-60% with severe MR confirmed by echo.  Post-cath developed respiratory failure and required intubation. Developed shock and renal failure (creatinine 1.6 on adm, prior to that only creatinine available was 1.1 2015). Trialysis catheter placed and pt had CRRT at [facility] for 48 hours prior to transfer here on 08/18/16->cath lab -> found to have ruptured posterior papillary muscle with severe MR->IABP/Swan. We were asked to see. D/T progressive rise in creatinine and volume excess with plan for mitral valve surgery, CRRT re-initiated 08/21/16.   Assessment/ Plan:    Acute renal failure on  chronic kidney disease stage III:  Creatinine 1.6 on admission to [facility] (prior to that only creatinine 1.1 07/2014) AKI from cardiogenic shock with evolution to ATN and contrast nephropathy after emergent coronary angiogram/intervention at [facility] and rec'd CRRT 48 hours at [facility] prior to transfer  Had 715 ml UOP yesterday but fluid balance remains markedly + and creatinine continues to rise In preparation for MV surgery, will re-initiate CRRT   neg 50-75 ml/hour (discussed with Dr. Aundra Dubin),   4K fluids   no heparin (on systemic + tiroviban)  Cardiogenic shock status post acute inferopost STEMI/posterior papillary muscle/Severe MR: On pressors and IABP Ongoing close follow-up by [facility] for definitive management with mitral valve replacement.   Acute MI Status post emergent coronary angiogram and PCI with 2 stents to OM1 branch.  Anemia S/p PRBC's X 1 9/17.  VDRF Secondary to cardiogenic shock, continue ventilator management per CCM.  Fever/group B streptococci and sputum cultures On vanco/zosyn  Jamal Maes, MD [facility] [phone] Pager 08/21/2016, 8:15 AM   .

## 2016-08-21 NOTE — Progress Notes (Signed)
PULMONARY / CRITICAL CARE MEDICINE   Name: Deborah [name] MRN: [id number] DOB: [birth year]    ADMISSION DATE:  08/18/2016 CONSULTATION DATE:  08/18/2016  REFERRING MD:  Bensimhon   CHIEF COMPLAINT:  Ventilator management and critical care services   HISTORY OF PRESENT ILLNESS:   60 yo female admitted to [facility] 08/15/16 with STEMI s/p cath to [place].  Developed cardiogenic shock, VDRF (ETT 9/14), AKI with metabolic acidosis and started on CRRT 08/17/16.  Transferred to [facility] to tx shock and evaluated new severe MR from papillary muscle rupture.  She also had fever and found to have Group B Strep in sputum culture.  SUBJECTIVE:  Remains on pressors, CRRT.  VITAL SIGNS: BP 108/71   Pulse 92   Temp 98.8 F (37.1 C)   Resp 18   Ht 5\' 1"  (1.549 m) Comment: from 08/15/16 encounter  Wt 173 lb 8 oz (78.7 kg)   SpO2 99%   BMI 32.78 kg/m   HEMODYNAMICS: PAP: (33-109)/(21-30) 40/28 CVP:  [8 mmHg-15 mmHg] 13 mmHg PCWP:  [16 mmHg-20 mmHg] 16 mmHg CO:  [4.9 L/min-5.2 L/min] 4.9 L/min CI:  [2.9 L/min/m2-3.1 L/min/m2] 2.9 L/min/m2  VENTILATOR SETTINGS: Vent Mode: PRVC FiO2 (%):  [40 %] 40 % Set Rate:  [18 bmp] 18 bmp Vt Set:  [450 mL] 450 mL PEEP:  [5 cmH20] 5 cmH20 Plateau Pressure:  [21 cmH20-23 cmH20] 21 cmH20  INTAKE / OUTPUT: I/O last 3 completed shifts: In: 5094.5 [I.V.:1939.5; Blood:335; NG/GT:2270; IV Piggyback:550] Out: 865 [Urine:865]  PHYSICAL EXAMINATION: General: sedated Neuro: RASS -4 HEENT: ETT in place Cardiac: regular, 4/6 SM Chest: basilar crackles Abd: soft, non tender Ext: cool, 1+ edema Skin: no rashes  LABS:  BMET  Recent Labs Lab 08/19/16 0410 08/20/16 0500 08/21/16 0436  NA 137 136 135  K 4.1 4.1 5.0  CL 106 106 105  CO2 23 22 20*  BUN 43* 58* 69*  CREATININE 3.12* 3.94* 4.96*  GLUCOSE 163* 161* 272*    Electrolytes  Recent Labs Lab 08/17/16 1601 08/18/16 0518  08/18/16 1500 08/19/16 0410 08/20/16 0500 08/21/16 0436  CALCIUM  --  8.4*   8.2*  < > 8.1* 7.7* 7.3* 7.4*  MG 1.7 1.9  --  1.8 1.7 1.9 1.9  PHOS 4.2 2.2*  2.1*  --  2.7  --   --   --   < > = values in this interval not displayed.  CBC  Recent Labs Lab 08/20/16 0723 08/20/16 1329 08/21/16 0436  WBC 13.3* 13.1* 14.2*  HGB 7.7* 8.5* 8.2*  HCT 23.9* 26.4* 25.9*  PLT 234 216 235    Coag's  Recent Labs Lab 08/15/16 1338  APTT 28  INR 1.06    Sepsis Markers  Recent Labs Lab 08/19/16 1451 08/19/16 1706 08/20/16 0500 08/21/16 0436  LATICACIDVEN 1.4  --  1.3  --   PROCALCITON  --  5.44 5.24 4.50    ABG  Recent Labs Lab 08/19/16 0239 08/20/16 0505 08/21/16 0430  PHART 7.469* 7.367 REPORTED AND READ BACK TO STOWE,S RN AT 0550 BY ISAAC,D RRT  PCO2ART 34.3 37.3 REPORTED TO STOWE,S RN AT 0550 BY ISAAC,D RRT   PO2ART 192.0* 107 RESULT TO STOWE,S RN AT 0550 BY ISAAC,D RRT    Liver Enzymes  Recent Labs Lab 08/19/16 0410 08/20/16 0500 08/21/16 0436  AST 40 41 40  ALT 25 25 26   ALKPHOS 71 64 74  BILITOT 0.7 0.5 0.5  ALBUMIN 1.6* 1.6* 1.5*    Cardiac Enzymes  Recent Labs Lab 08/16/16 1737 08/16/16 2308 08/17/16 0431  TROPONINI 51.74* 38.24* 26.79*    Glucose  Recent Labs Lab 08/20/16 2109 08/20/16 2203 08/20/16 2306 08/21/16 0005 08/21/16 0417 08/21/16 0756  GLUCAP 150* 159* 148* 157* 247* 271*    Imaging Dg Chest Port 1 View  Result Date: 08/21/2016 CLINICAL DATA:  Acute hypoxemic respiratory failure EXAM: PORTABLE CHEST 1 VIEW COMPARISON:  08/20/2016 FINDINGS: Support devices are stable. Endotracheal tube remains at the level of the carina. Borderline heart size. Bilateral airspace opacities are again noted, likely edema. Findings stable or slightly worsened since prior study. Possible small right effusion. IMPRESSION: Slight interval worsening in pulmonary edema pattern. Small right effusion. Endotracheal tube remains at the level of the carina and could be retracted 2-3 cm for optimal positioning. Electronically  Signed   By: Rolm Baptise M.D.   On: 08/21/2016 07:40    STUDIES:  LHC 9/12 >> PCI to OM1, severe MR, EF 55 to 60% RHC 9/15 >> RA 5, RV 37/3/6, PA 38/18/26, PCWP 15, CI 1.9, PVR 3.3 WU TEE 9/15 >> EF 60 to 65%, flail motion MV with severe MR  CULTURES: Blood 9/15 >> Sputum 9/15 >> Group B Strep  ANTIBIOTICS: Zosyn 9/16 >> 9/17 Vancomycin 9/16 >> 9/17 Rocephin 9/17 >>  SIGNIFICANT EVENTS: 9/12 Admit to [facility], PCI to [place] 9/14 ETT, CRRT 9/15 to [facility] 9/16 off insulin gtt  LINES/TUBES: Right IJ PICC 9/12 >> ETT 9/14 >>  HD cath left IJ 9/14 >> Rt femoral introducer 9/15 >> IABP 9/15 >> Lt femoral a line 9/15 >>   ASSESSMENT / PLAN:  PULMONARY A: Acute hypoxic respiratory failure in setting of pulmonary edema. P:   Full vent support  No wean until more stable hemodynamics F/u CXR, ABG  CARDIOVASCULAR A:  Inferior/posterior MI. Severe MR w/ ruptured posterior papillary muscle and subsequent Cardiogenic shock. Septic shock. P:  Continue IABP Wean pressors for MAP >65 Cardiothoracic surgery following - plan is for MVR at some point  Swan parameters reviewed On heparin + aggrastat  RENAL A:   Acute (likely on chronic) renal failure (baseline creatinine 1.6-1.7). Recent metabolic acidosis (CRRT from 9/14 to 9/15). P:   Renal replacement per nephrology F/u BMET, ABG  GASTROINTESTINAL A:   Critical illness related malnutrition. P:   Protonix for SUP Tube feeds  HEMATOLOGIC A:   Anemia of critical illness. P:  F/u CBC  INFECTIOUS A:   Group B Strep PNA. P:   Change to rocephin 9/18  ENDOCRINE A:   DM. P:   SSI with lantus  NEUROLOGIC A:   Acute metabolic encephalopathy. P:   RASS goal: -1 PAD protocol  - fent gtt, minimise versed  Updated pt's eldest daughter at bedside 9/18.  CC time 38 minutes.  Chesley Mires, MD [facility] Pulmonary/Critical Care 08/21/2016, 10:48 AM Pager:  [phone] After 3pm call: [phone]

## 2016-08-22 ENCOUNTER — Inpatient Hospital Stay (HOSPITAL_COMMUNITY): Payer: Medicaid Other

## 2016-08-22 DIAGNOSIS — I340 Nonrheumatic mitral (valve) insufficiency: Secondary | ICD-10-CM

## 2016-08-22 LAB — CARBOXYHEMOGLOBIN
CARBOXYHEMOGLOBIN: 0.7 % (ref 0.5–1.5)
Carboxyhemoglobin: 0.9 % (ref 0.5–1.5)
METHEMOGLOBIN: 0.7 % (ref 0.0–1.5)
Methemoglobin: 0.9 % (ref 0.0–1.5)
O2 SAT: 57.8 %
O2 Saturation: 66.1 %
Total hemoglobin: 9.5 g/dL — ABNORMAL LOW (ref 12.0–16.0)
Total hemoglobin: 13.9 g/dL (ref 12.0–16.0)

## 2016-08-22 LAB — COMPREHENSIVE METABOLIC PANEL
ALBUMIN: 1.4 g/dL — AB (ref 3.5–5.0)
ALK PHOS: 79 U/L (ref 38–126)
ALT: 29 U/L (ref 14–54)
AST: 38 U/L (ref 15–41)
Anion gap: 9 (ref 5–15)
BILIRUBIN TOTAL: 0.5 mg/dL (ref 0.3–1.2)
BUN: 49 mg/dL — AB (ref 6–20)
CO2: 22 mmol/L (ref 22–32)
Calcium: 7.2 mg/dL — ABNORMAL LOW (ref 8.9–10.3)
Chloride: 106 mmol/L (ref 101–111)
Creatinine, Ser: 3.11 mg/dL — ABNORMAL HIGH (ref 0.44–1.00)
GFR calc Af Amer: 18 mL/min — ABNORMAL LOW (ref >60)
GFR calc non Af Amer: 15 mL/min — ABNORMAL LOW (ref >60)
GLUCOSE: 221 mg/dL — AB (ref 65–99)
POTASSIUM: 4.3 mmol/L (ref 3.5–5.1)
SODIUM: 137 mmol/L (ref 135–145)
TOTAL PROTEIN: 5.3 g/dL — AB (ref 6.5–8.1)

## 2016-08-22 LAB — RENAL FUNCTION PANEL
ALBUMIN: 1.7 g/dL — AB (ref 3.5–5.0)
ANION GAP: 11 (ref 5–15)
BUN: 40 mg/dL — ABNORMAL HIGH (ref 6–20)
CO2: 22 mmol/L (ref 22–32)
Calcium: 7.9 mg/dL — ABNORMAL LOW (ref 8.9–10.3)
Chloride: 103 mmol/L (ref 101–111)
Creatinine, Ser: 2.88 mg/dL — ABNORMAL HIGH (ref 0.44–1.00)
GFR calc Af Amer: 19 mL/min — ABNORMAL LOW (ref >60)
GFR calc non Af Amer: 17 mL/min — ABNORMAL LOW (ref >60)
GLUCOSE: 234 mg/dL — AB (ref 65–99)
PHOSPHORUS: 2.9 mg/dL (ref 2.5–4.6)
POTASSIUM: 4.2 mmol/L (ref 3.5–5.1)
Sodium: 136 mmol/L (ref 135–145)

## 2016-08-22 LAB — CBC
HEMATOCRIT: 29.8 % — AB (ref 36.0–46.0)
HEMOGLOBIN: 9.5 g/dL — AB (ref 12.0–15.0)
MCH: 28 pg (ref 26.0–34.0)
MCHC: 31.9 g/dL (ref 30.0–36.0)
MCV: 87.9 fL (ref 78.0–100.0)
Platelets: 327 10*3/uL (ref 150–400)
RBC: 3.39 MIL/uL — ABNORMAL LOW (ref 3.87–5.11)
RDW: 14.8 % (ref 11.5–15.5)
WBC: 19.4 10*3/uL — ABNORMAL HIGH (ref 4.0–10.5)

## 2016-08-22 LAB — SURGICAL PCR SCREEN
MRSA, PCR: NEGATIVE
Staphylococcus aureus: POSITIVE — AB

## 2016-08-22 LAB — BLOOD GAS, ARTERIAL
Acid-base deficit: 2 mmol/L (ref 0.0–2.0)
Bicarbonate: 23.3 mmol/L (ref 20.0–28.0)
Drawn by: 437071
FIO2: 40
MECHVT: 450 mL
O2 Saturation: 97.1 %
PEEP: 5 cmH2O
Patient temperature: 98.6
RATE: 18 resp/min
pCO2 arterial: 47.5 mmHg (ref 32.0–48.0)
pH, Arterial: 7.312 — ABNORMAL LOW (ref 7.350–7.450)
pO2, Arterial: 101 mmHg (ref 83.0–108.0)

## 2016-08-22 LAB — CBC WITH DIFFERENTIAL/PLATELET
Basophils Absolute: 0 10*3/uL (ref 0.0–0.1)
Basophils Relative: 0 %
EOS ABS: 0.3 10*3/uL (ref 0.0–0.7)
EOS PCT: 2 %
HEMATOCRIT: 27 % — AB (ref 36.0–46.0)
HEMOGLOBIN: 8.7 g/dL — AB (ref 12.0–15.0)
LYMPHS ABS: 1.1 10*3/uL (ref 0.7–4.0)
LYMPHS PCT: 7 %
MCH: 27.9 pg (ref 26.0–34.0)
MCHC: 32.2 g/dL (ref 30.0–36.0)
MCV: 86.5 fL (ref 78.0–100.0)
MONO ABS: 2.9 10*3/uL — AB (ref 0.1–1.0)
MONOS PCT: 19 %
Neutro Abs: 11.3 10*3/uL — ABNORMAL HIGH (ref 1.7–7.7)
Neutrophils Relative %: 72 %
Platelets: 262 10*3/uL (ref 150–400)
RBC: 3.12 MIL/uL — AB (ref 3.87–5.11)
RDW: 14.9 % (ref 11.5–15.5)
WBC: 15.6 10*3/uL — AB (ref 4.0–10.5)

## 2016-08-22 LAB — GLUCOSE, CAPILLARY
GLUCOSE-CAPILLARY: 185 mg/dL — AB (ref 65–99)
GLUCOSE-CAPILLARY: 178 mg/dL — AB (ref 65–99)
GLUCOSE-CAPILLARY: 251 mg/dL — AB (ref 65–99)
GLUCOSE-CAPILLARY: 244 mg/dL — AB (ref 65–99)
Glucose-Capillary: 188 mg/dL — ABNORMAL HIGH (ref 65–99)
Glucose-Capillary: 234 mg/dL — ABNORMAL HIGH (ref 65–99)

## 2016-08-22 LAB — CULTURE, RESPIRATORY W GRAM STAIN: Special Requests: NORMAL

## 2016-08-22 LAB — POCT I-STAT, CHEM 8
BUN: 36 mg/dL — ABNORMAL HIGH (ref 6–20)
CHLORIDE: 102 mmol/L (ref 101–111)
CREATININE: 2.6 mg/dL — AB (ref 0.44–1.00)
Calcium, Ion: 1.08 mmol/L — ABNORMAL LOW (ref 1.15–1.40)
GLUCOSE: 252 mg/dL — AB (ref 65–99)
HCT: 27 % — ABNORMAL LOW (ref 36.0–46.0)
Hemoglobin: 9.2 g/dL — ABNORMAL LOW (ref 12.0–15.0)
POTASSIUM: 4.6 mmol/L (ref 3.5–5.1)
Sodium: 136 mmol/L (ref 135–145)
TCO2: 25 mmol/L (ref 0–100)

## 2016-08-22 LAB — HEPARIN LEVEL (UNFRACTIONATED): Heparin Unfractionated: 0.26 IU/mL — ABNORMAL LOW (ref 0.30–0.70)

## 2016-08-22 LAB — MAGNESIUM: Magnesium: 1.9 mg/dL (ref 1.7–2.4)

## 2016-08-22 LAB — PHOSPHORUS: PHOSPHORUS: 2.3 mg/dL — AB (ref 2.5–4.6)

## 2016-08-22 LAB — PREPARE RBC (CROSSMATCH)

## 2016-08-22 LAB — AMYLASE: Amylase: 399 U/L — ABNORMAL HIGH (ref 28–100)

## 2016-08-22 MED ORDER — AMIODARONE HCL IN DEXTROSE 360-4.14 MG/200ML-% IV SOLN: 60.0000 mg/h | INTRAVENOUS | Status: DC

## 2016-08-22 MED ORDER — SODIUM CHLORIDE 0.9 % IV SOLN
INTRAVENOUS | Status: DC
  Filled 2016-08-22: qty 2.5

## 2016-08-22 MED ORDER — SODIUM CHLORIDE 0.9 % IV SOLN
INTRAVENOUS | Status: DC
  Administered 2016-08-23: 69 mL/h via INTRAVENOUS
  Administered 2016-08-23: 15:00:00 via INTRAVENOUS
  Filled 2016-08-22: qty 40

## 2016-08-22 MED ORDER — DEXTROSE 5 % IV SOLN
1.5000 g | INTRAVENOUS | Status: DC
  Administered 2016-08-23: 1.5 g via INTRAVENOUS
  Administered 2016-08-23: .75 g via INTRAVENOUS
  Filled 2016-08-22: qty 1.5

## 2016-08-22 MED ORDER — NITROGLYCERIN IN D5W 200-5 MCG/ML-% IV SOLN
2.0000 ug/min | INTRAVENOUS | Status: DC
  Filled 2016-08-22: qty 250

## 2016-08-22 MED ORDER — DOPAMINE-DEXTROSE 3.2-5 MG/ML-% IV SOLN
0.0000 ug/kg/min | INTRAVENOUS | Status: DC
  Filled 2016-08-22: qty 250

## 2016-08-22 MED ORDER — AMIODARONE HCL IN DEXTROSE 360-4.14 MG/200ML-% IV SOLN
60.0000 mg/h | INTRAVENOUS | Status: DC
  Administered 2016-08-22 (×2): 60 mg/h via INTRAVENOUS
  Filled 2016-08-22: qty 200

## 2016-08-22 MED ORDER — FENTANYL BOLUS VIA INFUSION
50.0000 ug | INTRAVENOUS | Status: DC | PRN
Start: 2016-08-22 — End: 2016-08-24
  Administered 2016-08-22 – 2016-08-23 (×2): 50 ug via INTRAVENOUS
  Filled 2016-08-22: qty 50

## 2016-08-22 MED ORDER — EPINEPHRINE HCL 1 MG/ML IJ SOLN
0.0000 ug/min | INTRAVENOUS | Status: DC
  Filled 2016-08-22: qty 4

## 2016-08-22 MED ORDER — SODIUM PHOSPHATES 45 MMOLE/15ML IV SOLN
10.0000 mmol | Freq: Two times a day (BID) | INTRAVENOUS | Status: DC
  Administered 2016-08-22 (×2): 10 mmol via INTRAVENOUS
  Filled 2016-08-22 (×5): qty 3.33

## 2016-08-22 MED ORDER — DEXTROSE 5 % IV SOLN
750.0000 mg | INTRAVENOUS | Status: DC
  Filled 2016-08-22: qty 750

## 2016-08-22 MED ORDER — POTASSIUM CHLORIDE 2 MEQ/ML IV SOLN
80.0000 meq | INTRAVENOUS | Status: DC
  Filled 2016-08-22: qty 40

## 2016-08-22 MED ORDER — PHENYLEPHRINE HCL 10 MG/ML IJ SOLN
30.0000 ug/min | INTRAVENOUS | Status: DC
  Administered 2016-08-23: 25 ug/min via INTRAVENOUS
  Filled 2016-08-22: qty 2

## 2016-08-22 MED ORDER — SODIUM CHLORIDE 0.9 % IV SOLN
INTRAVENOUS | Status: DC
  Administered 2016-08-23: 2.1 [IU]/h via INTRAVENOUS
  Filled 2016-08-22: qty 2.5

## 2016-08-22 MED ORDER — SODIUM CHLORIDE 0.9 % IV SOLN
INTRAVENOUS | Status: DC
  Filled 2016-08-22: qty 40

## 2016-08-22 MED ORDER — AMIODARONE LOAD VIA INFUSION
150.0000 mg | Freq: Once | INTRAVENOUS | Status: AC
  Administered 2016-08-22: 150 mg via INTRAVENOUS
  Filled 2016-08-22: qty 83.34

## 2016-08-22 MED ORDER — DEXTROSE 5 % IV SOLN
1.5000 g | INTRAVENOUS | Status: DC
  Filled 2016-08-22: qty 1.5

## 2016-08-22 MED ORDER — FENTANYL CITRATE (PF) 2500 MCG/50ML IJ SOLN
25.0000 ug/h | INTRAMUSCULAR | Status: DC
  Administered 2016-08-22: 100 ug/h via INTRAVENOUS
  Administered 2016-08-22: 25 ug/h via INTRAVENOUS
  Administered 2016-08-23: 100 ug/h via INTRAVENOUS
  Filled 2016-08-22: qty 50

## 2016-08-22 MED ORDER — CHLORHEXIDINE GLUCONATE CLOTH 2 % EX PADS: 6.0000 | MEDICATED_PAD | Freq: Once | CUTANEOUS | Status: DC

## 2016-08-22 MED ORDER — MAGNESIUM SULFATE 50 % IJ SOLN
40.0000 meq | INTRAMUSCULAR | Status: DC
  Filled 2016-08-22: qty 10

## 2016-08-22 MED ORDER — SODIUM CHLORIDE 0.9 % IV SOLN
INTRAVENOUS | Status: DC
  Filled 2016-08-22: qty 30

## 2016-08-22 MED ORDER — BISACODYL 5 MG PO TBEC: 5.0000 mg | DELAYED_RELEASE_TABLET | Freq: Once | ORAL | Status: DC

## 2016-08-22 MED ORDER — DOCUSATE SODIUM 50 MG/5ML PO LIQD
100.0000 mg | Freq: Two times a day (BID) | ORAL | Status: DC | PRN
  Administered 2016-08-25 – 2016-08-27 (×3): 100 mg
  Filled 2016-08-22 (×4): qty 10

## 2016-08-22 MED ORDER — AMIODARONE HCL IN DEXTROSE 360-4.14 MG/200ML-% IV SOLN
30.0000 mg/h | INTRAVENOUS | Status: DC
  Administered 2016-08-23 – 2016-08-26 (×8): 30 mg/h via INTRAVENOUS
  Filled 2016-08-22 (×8): qty 200

## 2016-08-22 MED ORDER — CHLORHEXIDINE GLUCONATE 0.12 % MT SOLN
15.0000 mL | Freq: Once | OROMUCOSAL | Status: AC
  Administered 2016-08-23: 15 mL via OROMUCOSAL
  Filled 2016-08-22: qty 15

## 2016-08-22 MED ORDER — INSULIN GLARGINE 100 UNIT/ML ~~LOC~~ SOLN
17.0000 [IU] | Freq: Every day | SUBCUTANEOUS | Status: DC
  Administered 2016-08-22: 17 [IU] via SUBCUTANEOUS
  Filled 2016-08-22 (×2): qty 0.17

## 2016-08-22 MED ORDER — VANCOMYCIN HCL 10 G IV SOLR
1250.0000 mg | INTRAVENOUS | Status: DC
  Filled 2016-08-22: qty 1250

## 2016-08-22 MED ORDER — DEXMEDETOMIDINE HCL IN NACL 400 MCG/100ML IV SOLN
0.1000 ug/kg/h | INTRAVENOUS | Status: DC
  Administered 2016-08-23: .3 ug/kg/h via INTRAVENOUS
  Filled 2016-08-22: qty 100

## 2016-08-22 MED ORDER — METOPROLOL TARTRATE 12.5 MG HALF TABLET
12.5000 mg | ORAL_TABLET | Freq: Once | ORAL | Status: AC
  Administered 2016-08-23: 12.5 mg via ORAL
  Filled 2016-08-22: qty 1

## 2016-08-22 MED ORDER — CHLORHEXIDINE GLUCONATE CLOTH 2 % EX PADS
6.0000 | MEDICATED_PAD | Freq: Once | CUTANEOUS | Status: AC
  Administered 2016-08-22: 6 via TOPICAL

## 2016-08-22 MED ORDER — MIDAZOLAM HCL 2 MG/2ML IJ SOLN
2.0000 mg | INTRAMUSCULAR | Status: DC | PRN
  Administered 2016-08-23: 2 mg via INTRAVENOUS
  Filled 2016-08-22 (×2): qty 2

## 2016-08-22 MED ORDER — AMIODARONE IV BOLUS ONLY 150 MG/100ML
150.0000 mg | Freq: Once | INTRAVENOUS | Status: AC
  Administered 2016-08-22: 150 mg via INTRAVENOUS
  Filled 2016-08-22: qty 100

## 2016-08-22 MED ORDER — BISACODYL 10 MG RE SUPP: 10.0000 mg | Freq: Every day | RECTAL | Status: DC | PRN

## 2016-08-22 MED ORDER — AMIODARONE HCL IN DEXTROSE 360-4.14 MG/200ML-% IV SOLN: 30.0000 mg/h | INTRAVENOUS | Status: DC

## 2016-08-22 MED ORDER — TIROFIBAN HCL IN NACL 5-0.9 MG/100ML-% IV SOLN
0.0750 ug/kg/min | INTRAVENOUS | Status: AC
  Administered 2016-08-22 (×2): 0.075 ug/kg/min via INTRAVENOUS
  Filled 2016-08-22: qty 100

## 2016-08-22 MED ORDER — DEXMEDETOMIDINE HCL IN NACL 400 MCG/100ML IV SOLN
0.1000 ug/kg/h | INTRAVENOUS | Status: DC
  Filled 2016-08-22: qty 100

## 2016-08-22 MED ORDER — TEMAZEPAM 15 MG PO CAPS: 15.0000 mg | ORAL_CAPSULE | Freq: Once | ORAL | Status: DC | PRN

## 2016-08-22 MED ORDER — DOPAMINE-DEXTROSE 3.2-5 MG/ML-% IV SOLN: 0.0000 ug/kg/min | INTRAVENOUS | Status: DC

## 2016-08-22 MED ORDER — AMIODARONE HCL IN DEXTROSE 360-4.14 MG/200ML-% IV SOLN
INTRAVENOUS | Status: AC
  Administered 2016-08-22: 60 mg/h via INTRAVENOUS
  Filled 2016-08-22: qty 200

## 2016-08-22 MED ORDER — SODIUM CHLORIDE 0.9 % IV SOLN
1250.0000 mg | INTRAVENOUS | Status: DC
  Administered 2016-08-23: 1250 mg via INTRAVENOUS
  Filled 2016-08-22: qty 1250

## 2016-08-22 MED ORDER — NITROGLYCERIN IN D5W 200-5 MCG/ML-% IV SOLN: 2.0000 ug/min | INTRAVENOUS | Status: DC

## 2016-08-22 MED ORDER — PHENYLEPHRINE HCL 10 MG/ML IJ SOLN
30.0000 ug/min | INTRAMUSCULAR | Status: DC
  Filled 2016-08-22: qty 2

## 2016-08-22 MED ORDER — NOREPINEPHRINE BITARTRATE 1 MG/ML IV SOLN
0.0000 ug/min | INTRAVENOUS | Status: DC
  Administered 2016-08-22: 5 ug/min via INTRAVENOUS
  Filled 2016-08-22: qty 16

## 2016-08-22 MED ORDER — PLASMA-LYTE 148 IV SOLN
INTRAVENOUS | Status: DC
  Filled 2016-08-22: qty 2.5

## 2016-08-22 MED ORDER — AMIODARONE LOAD VIA INFUSION
150.0000 mg | Freq: Once | INTRAVENOUS | Status: DC
  Filled 2016-08-22: qty 83.34

## 2016-08-22 NOTE — Anesthesia Preprocedure Evaluation (Addendum)
Anesthesia Evaluation  Patient identified by MRN, date of birth, ID band Patient unresponsive  General Assessment Comment:Sedated and ventilated  Reviewed: Allergy & Precautions, NPO status , Patient's Chart, lab work & pertinent test results, reviewed documented beta blocker date and time , Unable to perform ROS - Chart review only  History of Anesthesia Complications Negative for: history of anesthetic complications  Airway Mallampati: Intubated       Dental   Pulmonary  VDRF with cardiogenic shock s/p MI, now possible septic shock    breath sounds clear to auscultation       Cardiovascular hypertension, + CAD and + Past MI  + dysrhythmias  Rhythm:Regular Rate:Normal  Cardiogenic shock:  IABP, pressors, intubated and sedated 9/17 TEE: EF 60-65%, flail motion due to rupture of post-medial pap muscle head, severe MR directed posteriorly   Neuro/Psych sedated    GI/Hepatic negative GI ROS, Neg liver ROS,   Endo/Other  diabetes (glu 184)Morbid obesity  Renal/GU ARFRenal disease (creat 2.31, CVVH)     Musculoskeletal   Abdominal (+) + obese,   Peds  Hematology  (+) Blood dyscrasia (Hb 9.3), ,   Anesthesia Other Findings   Reproductive/Obstetrics                          Anesthesia Physical Anesthesia Plan  ASA: IV  Anesthesia Plan: General   Post-op Pain Management:    Induction: Inhalational  Airway Management Planned: Oral ETT  Additional Equipment: Arterial line, CVP, PA Cath, TEE and 3D TEE  Intra-op Plan:   Post-operative Plan: Post-operative intubation/ventilation  Informed Consent: I have reviewed the patients History and Physical, chart, labs and discussed the procedure including the risks, benefits and alternatives for the proposed anesthesia with the patient or authorized representative who has indicated his/her understanding and acceptance.   History available from chart  only  Plan Discussed with: CRNA and Surgeon  Anesthesia Plan Comments: (Plan routine monitors, existing A line, PA cath, GETA with TEE and post op ventilation)        Anesthesia Quick Evaluation

## 2016-08-22 NOTE — Progress Notes (Signed)
PULMONARY / CRITICAL CARE MEDICINE   Name: Deborah [name] MRN: [id number] DOB: [birth year]    ADMISSION DATE:  08/18/2016 CONSULTATION DATE:  08/18/2016  REFERRING MD:  Bensimhon   CHIEF COMPLAINT:  Ventilator management and critical care services   HISTORY OF PRESENT ILLNESS:   60 yo female admitted to [facility] 08/15/16 with STEMI s/p cath to [place].  Developed cardiogenic shock, VDRF (ETT 9/14), AKI with metabolic acidosis and started on CRRT 08/17/16.  Transferred to [facility] to tx shock and evaluated new severe MR from papillary muscle rupture.  She also had fever and found to have Group B Strep in sputum culture.  SUBJECTIVE:  Off pressors.  Remains on CRRT.  Tolerating some pressure support.  VITAL SIGNS: BP (!) 104/59 (BP Location: Right Arm)   Pulse 96   Temp 97.5 F (36.4 C)   Resp 19   Ht 5\' 1"  (1.549 m) Comment: from 08/15/16 encounter  Wt 177 lb 7.5 oz (80.5 kg)   SpO2 100%   BMI 33.53 kg/m   HEMODYNAMICS: PAP: (30-45)/(20-31) 37/23 CVP:  [3 mmHg-18 mmHg] 9 mmHg PCWP:  [18 mmHg-23 mmHg] 18 mmHg CO:  [4.1 L/min-5 L/min] 4.8 L/min CI:  [2.4 L/min/m2-2.9 L/min/m2] 2.8 L/min/m2  VENTILATOR SETTINGS: Vent Mode: PRVC FiO2 (%):  [40 %] 40 % Set Rate:  [18 bmp] 18 bmp Vt Set:  [450 mL] 450 mL PEEP:  [5 cmH20] 5 cmH20 Plateau Pressure:  [20 cmH20-25 cmH20] 25 cmH20  INTAKE / OUTPUT: I/O last 3 completed shifts: In: 3534.4 [I.V.:1249.4; NG/GT:2035; IV Piggyback:250] Out: 4627 [Urine:408; Other:3353]  PHYSICAL EXAMINATION: General: sedated Neuro: RASS -4 HEENT: ETT in place Cardiac: regular, 4/6 SM Chest: basilar crackles Abd: soft, non tender Ext: 1+ edema Skin: no rashes  LABS:  BMET  Recent Labs Lab 08/21/16 0436 08/21/16 1600 08/22/16 0315  NA 135 135 137  K 5.0 4.6 4.3  CL 105 105 106  CO2 20* 23 22  BUN 69* 64* 49*  CREATININE 4.96* 4.26* 3.11*  GLUCOSE 272* 263* 221*    Electrolytes  Recent Labs Lab 08/18/16 1500  08/20/16 0500 08/21/16 0436  08/21/16 1600 08/22/16 0315  CALCIUM 8.1*  < > 7.3* 7.4* 7.3* 7.2*  MG 1.8  < > 1.9 1.9  --  1.9  PHOS 2.7  --   --   --  2.9 2.3*  < > = values in this interval not displayed.  CBC  Recent Labs Lab 08/21/16 0436 08/21/16 1600 08/22/16 0315  WBC 14.2* 14.1* 15.6*  HGB 8.2* 8.6* 8.7*  HCT 25.9* 26.7* 27.0*  PLT 235 249 262    Coag's  Recent Labs Lab 08/15/16 1338  APTT 28  INR 1.06    Sepsis Markers  Recent Labs Lab 08/19/16 1451 08/19/16 1706 08/20/16 0500 08/21/16 0436  LATICACIDVEN 1.4  --  1.3  --   PROCALCITON  --  5.44 5.24 4.50    ABG  Recent Labs Lab 08/20/16 0505 08/21/16 0430 08/22/16 0340  PHART 7.367 7.260* 7.312*  PCO2ART 37.3 43.4 47.5  PO2ART 107 69.5* 101    Liver Enzymes  Recent Labs Lab 08/20/16 0500 08/21/16 0436 08/21/16 1600 08/22/16 0315  AST 41 40  --  38  ALT 25 26  --  29  ALKPHOS 64 74  --  79  BILITOT 0.5 0.5  --  0.5  ALBUMIN 1.6* 1.5* 1.5* 1.4*    Cardiac Enzymes  Recent Labs Lab 08/16/16 1737 08/16/16 2308 08/17/16 0431  TROPONINI 51.74*  38.24* 26.79*    Glucose  Recent Labs Lab 08/21/16 1242 08/21/16 1621 08/21/16 1950 08/21/16 2320 08/22/16 0344 08/22/16 0725  GLUCAP 275* 268* 212* 220* 185* 178*    Imaging Dg Chest Port 1 View  Result Date: 08/22/2016 CLINICAL DATA:  Respiratory failure, shortness of Breath EXAM: PORTABLE CHEST 1 VIEW COMPARISON:  08/21/2016 FINDINGS: Support devices are stable. Cardiomegaly. Bilateral perihilar and lower lobe opacities throughout the lungs have increased slightly since prior study, likely mild edema. Suspect small layering effusions. IMPRESSION: Mild interval worsening of pulmonary edema/ CHF. Small layering effusions. Electronically Signed   By: Rolm Baptise M.D.   On: 08/22/2016 07:30    STUDIES:  LHC 9/12 >> PCI to OM1, severe MR, EF 55 to 60% RHC 9/15 >> RA 5, RV 37/3/6, PA 38/18/26, PCWP 15, CI 1.9, PVR 3.3 WU TEE 9/15 >> EF 60 to 65%, flail  motion MV with severe MR  CULTURES: Blood 9/15 >> Sputum 9/15 >> Group B Strep  ANTIBIOTICS: Zosyn 9/16 >> 9/17 Vancomycin 9/16 >> 9/17 Rocephin 9/17 >>  SIGNIFICANT EVENTS: 9/12 Admit to [facility], PCI to [place] 9/14 ETT, CRRT 9/15 to [facility] 9/16 off insulin gtt 9/18 off pressors  LINES/TUBES: Right IJ PICC 9/12 >> ETT 9/14 >>  HD cath left IJ 9/14 >> Rt femoral introducer 9/15 >> IABP 9/15 >> Lt femoral a line 9/15 >>   ASSESSMENT / PLAN:  PULMONARY A: Acute hypoxic respiratory failure in setting of pulmonary edema. P:   Pressure support wean as tolerated F/u CXR, ABG  CARDIOVASCULAR A:  Inferior/posterior MI. Severe MR w/ ruptured posterior papillary muscle and subsequent Cardiogenic shock. Septic shock. P:  IABP, PA catheter per cardiology Cardiothoracic surgery following - plan is for MVR likely 9/20 Continue heparin, tirofiban per cardiology  RENAL A:   AKI >> baseline creatinine 1.6-1.7. P:   Renal replacement per nephrology F/u BMET, ABG  GASTROINTESTINAL A:   Critical illness related malnutrition. P:   Protonix for SUP Tube feeds  HEMATOLOGIC A:   Anemia of critical illness. P:  F/u CBC  INFECTIOUS A:   Group B Strep PNA. P:   Day 4 of Abx, Day 2 of rocephin  ENDOCRINE A:   DM. P:   SSI with lantus  NEUROLOGIC A:   Acute metabolic encephalopathy. P:   RASS goal: -1  CC time 33 minutes.  Chesley Mires, MD [facility] Pulmonary/Critical Care 08/22/2016, 7:37 AM Pager:  [phone] After 3pm call: [phone]

## 2016-08-22 NOTE — Progress Notes (Signed)
4 Days Post-Op Procedure(s) (LRB): Right Heart Cath (N/A) IABP Insertion (N/A) Subjective: Fever has resolved with iv antibiotics CVVH removing fluid- FiO2 .40 High dose pressors have been weaned [Plan MVR with tissue valve and possible CABG tomorrow am Objective: Vital signs in last 24 hours: Temp:  [97 F (36.1 C)-99 F (37.2 C)] 98.1 F (36.7 C) (09/19 1500) Pulse Rate:  [96-112] 112 (09/19 1130) Cardiac Rhythm: Normal sinus rhythm;Sinus tachycardia (09/19 0800) Resp:  [14-28] 15 (09/19 1500) BP: (93-120)/(37-59) 120/46 (09/19 1130) SpO2:  [94 %-100 %] 100 % (09/19 1200) Arterial Line BP: (84-131)/(34-57) 84/38 (09/19 1500) FiO2 (%):  [40 %] 40 % (09/19 1200) Weight:  [176 lb 2.4 oz (79.9 kg)-177 lb 7.5 oz (80.5 kg)] 176 lb 2.4 oz (79.9 kg) (09/19 0900)  Hemodynamic parameters for last 24 hours: PAP: (25-47)/(18-31) 37/23 CVP:  [3 mmHg-17 mmHg] 11 mmHg PCWP:  [18 mmHg-22 mmHg] 18 mmHg CO:  [4.8 L/min-5 L/min] 5 L/min CI:  [2.8 L/min/m2-2.9 L/min/m2] 2.9 L/min/m2  Intake/Output from previous day: 09/18 0701 - 09/19 0700 In: 2211.2 [I.V.:781.2; NG/GT:1430] Out: 7614 [Urine:138] Intake/Output this shift: Total I/O In: 1611.2 [I.V.:917.9; NG/GT:440; IV Piggyback:253.3] Out: 7092 [Other:1384]  3/6 murmur of MR Rales at bases Lab Results:  Recent Labs  08/21/16 1600 08/22/16 0315  WBC 14.1* 15.6*  HGB 8.6* 8.7*  HCT 26.7* 27.0*  PLT 249 262   BMET:  Recent Labs  08/21/16 1600 08/22/16 0315  NA 135 137  K 4.6 4.3  CL 105 106  CO2 23 22  GLUCOSE 263* 221*  BUN 64* 49*  CREATININE 4.26* 3.11*  CALCIUM 7.3* 7.2*    PT/INR: No results for input(s): LABPROT, INR in the last 72 hours. ABG    Component Value Date/Time   PHART 7.312 (L) 08/22/2016 0340   HCO3 23.3 08/22/2016 0340   TCO2 26 08/19/2016 0239   ACIDBASEDEF 2.0 08/22/2016 0340   O2SAT 97.1 08/22/2016 0340   CBG (last 3)   Recent Labs  08/21/16 2320 08/22/16 0344 08/22/16 0725  GLUCAP  220* 185* 178*    Assessment/Plan: S/P Procedure(s) (LRB): Right Heart Cath (N/A) IABP Insertion (N/A) MVR with possible CABG in am Procedure and risks/benefits d/w family  LOS: 4 days    Deborah [name] 08/22/2016

## 2016-08-22 NOTE — Progress Notes (Signed)
Patient ID: Deborah [name], female   DOB: 1956-09-27, 60 y.o.   MRN: [id number]   60 y/o woman with DM2, HTN, HL and noncompliance presented to [facility] ER with infrerior posterior STEMI on 9/12. Taken to cath lab and found to have occluded small to moderate OM-1 branch. Underwent successful PCS with stent x 2 by Dr. Clayborn Bigness. Coronaries otherwise ok. EF 55-60% with severe MR confirmed by echo.   Post-cath developed respiratory failure and required intubation. Developed shock and renal failure with peak creatinine 3.4 (1.6 on admit). Trialysis catheter placed.   Patient persistently acidotic with concern for ischemic MR and transferred here. En route develop severe hypotension and norepinephrine started.   Brought to cath lab 9/15 and TEE showed normal LV (EF 60-65%) and normal RV function with ruptured posterior papillary muscle with severe posterior MR.  Underwent placement of Swan and IABP.  Brilinta stopped 9/15, tirofiban started.   Group B Strep in sputum culture, on broad spectrum abx.     She is off vasopressin and norepi.   IABP remains 1:1, augmenting normally.   No overt bleeding.  hgb 8.7.  Started on CVVHD.   Sedation off this morning.    Swan numbers: RA 9 PA 51/25 PCWP 18 CI 2.92 SVR 1590 Co-ox 66%  Scheduled Meds: . aspirin  81 mg Oral Daily  . atorvastatin  80 mg Oral Daily  . cefTRIAXone (ROCEPHIN)  IV  2 g Intravenous Q24H  . [START ON 08/23/2016] chlorhexidine  15 mL Mouth/Throat Once  . chlorhexidine gluconate (MEDLINE KIT)  15 mL Mouth Rinse BID  . Chlorhexidine Gluconate Cloth  6 each Topical Once   And  . Chlorhexidine Gluconate Cloth  6 each Topical Once  . insulin aspart  0-9 Units Subcutaneous Q4H  . insulin glargine  35 Units Subcutaneous QHS  . mouth rinse  15 mL Mouth Rinse 10 times per day  . [START ON 08/23/2016] metoprolol tartrate  12.5 mg Oral Once  . pantoprazole sodium  40 mg Per Tube Q24H   Continuous Infusions: . sodium chloride 10 mL/hr  at 08/21/16 1250  . feeding supplement (VITAL AF 1.2 CAL) 1,000 mL (08/21/16 2051)  . fentaNYL infusion INTRAVENOUS    . heparin 500 Units/hr (08/22/16 0616)  . dialysis replacement fluid (prismasate) 300 mL/hr at 08/22/16 0345  . dialysis replacement fluid (prismasate) 200 mL/hr at 08/21/16 1036  . dialysate (PRISMASATE) 1,000 mL/hr at 08/22/16 0655  . tirofiban     PRN Meds:.acetaminophen, bisacodyl, docusate, fentaNYL, heparin, heparin, midazolam, ondansetron (ZOFRAN) IV, sodium chloride, sodium chloride flush    Vitals:   08/22/16 0400 08/22/16 0500 08/22/16 0600 08/22/16 0700  BP:      Pulse:      Resp: 18 19 18 19   Temp: 97.3 F (36.3 C) 97.5 F (36.4 C) 97.3 F (36.3 C) 97.5 F (36.4 C)  TempSrc: Core (Comment)   Core (Comment)  SpO2:    100%  Weight:      Height:        Intake/Output Summary (Last 24 hours) at 08/22/16 0750 Last data filed at 08/22/16 0700  Gross per 24 hour  Intake          2211.17 ml  Output             3491 ml  Net         -1279.83 ml    LABS: Basic Metabolic Panel:  Recent Labs  08/21/16 0436 08/21/16 1600 08/22/16 0315  NA  135 135 137  K 5.0 4.6 4.3  CL 105 105 106  CO2 20* 23 22  GLUCOSE 272* 263* 221*  BUN 69* 64* 49*  CREATININE 4.96* 4.26* 3.11*  CALCIUM 7.4* 7.3* 7.2*  MG 1.9  --  1.9  PHOS  --  2.9 2.3*   Liver Function Tests:  Recent Labs  08/21/16 0436 08/21/16 1600 08/22/16 0315  AST 40  --  38  ALT 26  --  29  ALKPHOS 74  --  79  BILITOT 0.5  --  0.5  PROT 5.1*  --  5.3*  ALBUMIN 1.5* 1.5* 1.4*    Recent Labs  08/21/16 0436 08/22/16 0315  AMYLASE 262* 399*   CBC:  Recent Labs  08/21/16 0436 08/21/16 1600 08/22/16 0315  WBC 14.2* 14.1* 15.6*  NEUTROABS 10.4*  --  11.3*  HGB 8.2* 8.6* 8.7*  HCT 25.9* 26.7* 27.0*  MCV 87.8 87.3 86.5  PLT 235 249 262   Cardiac Enzymes: No results for input(s): CKTOTAL, CKMB, CKMBINDEX, TROPONINI in the last 72 hours. BNP: Invalid input(s):  POCBNP D-Dimer: No results for input(s): DDIMER in the last 72 hours. Hemoglobin A1C: No results for input(s): HGBA1C in the last 72 hours. Fasting Lipid Panel: No results for input(s): CHOL, HDL, LDLCALC, TRIG, CHOLHDL, LDLDIRECT in the last 72 hours. Thyroid Function Tests: No results for input(s): TSH, T4TOTAL, T3FREE, THYROIDAB in the last 72 hours.  Invalid input(s): FREET3 Anemia Panel: No results for input(s): VITAMINB12, FOLATE, FERRITIN, TIBC, IRON, RETICCTPCT in the last 72 hours.  RADIOLOGY: Dg Chest 1 View  Result Date: 08/17/2016 CLINICAL DATA:  Endotracheal tube placement, OG tube placement EXAM: CHEST 1 VIEW COMPARISON:  Portable chest x-ray of 08/17/2016 FINDINGS: The tip of the endotracheal tube is within the right mainstem bronchus and needs to be withdrawn by approximately 5 cm per OG tube extends below the hemidiaphragm. Bilateral primarily perihilar airspace disease again is noted most consistent with edema or possibly aspiration. A small left pleural effusion is noted. Right central venous line is unchanged. IMPRESSION: 1. Endotracheal tip is within the right mainstem bronchus and needs to be withdrawn by approximately 5 cm. 2. OG tube extends below the hemidiaphragm. 3. Little change in bilateral airspace disease with small left pleural effusion. Electronically Signed   By: Ivar Drape M.D.   On: 08/17/2016 08:05   Dg Chest 1 View  Result Date: 08/17/2016 CLINICAL DATA:  Acute respiratory failure. EXAM: CHEST 1 VIEW COMPARISON:  08/15/2016 FINDINGS: Type of the right central line in the mid SVC. The bilateral perihilar opacities are becoming more confluent, air bronchograms non noted in the right suprahilar region. Stable cardiomegaly. Increasing left lung base atelectasis. Small pleural effusion on prior exam is not well visualized, suspect unchanged. No pneumothorax. IMPRESSION: Coalescing perihilar bilateral opacities, suspicious for increasing pulmonary edema.  Increasing left lung base atelectasis. Electronically Signed   By: Jeb Levering M.D.   On: 08/17/2016 03:15   Dg Chest 1 View  Result Date: 08/15/2016 CLINICAL DATA:  Respiratory failure EXAM: CHEST 1 VIEW COMPARISON:  January 22, 2010 FINDINGS: The mediastinal contour is normal. The heart size is enlarged. The diffuse airspace opacities identified throughout bilateral lungs. There is probably a minimal left pleural effusion. The osseous structures are normal. IMPRESSION: Congestive heart failure. Electronically Signed   By: Abelardo Diesel M.D.   On: 08/15/2016 19:25   Dg Abd 1 View  Result Date: 08/17/2016 CLINICAL DATA:  Endotracheal tube placement, OG tube placement  EXAM: ABDOMEN - 1 VIEW COMPARISON:  CT abdomen pelvis of 12/10/2010 FINDINGS: The OG tube tip extends into the body the stomach. The bowel gas pattern is nonspecific. Contrast is noted being excreted by the kidneys and within the urinary bladder secondary to recent CT of the abdomen pelvis. The bowel gas pattern is nonspecific. There may be a small left pleural effusion present. IMPRESSION: OG tube tip extends into the body the stomach. The bowel gas pattern is nonspecific. Electronically Signed   By: Ivar Drape M.D.   On: 08/17/2016 08:09   Dg Chest Port 1 View  Result Date: 08/22/2016 CLINICAL DATA:  Respiratory failure, shortness of Breath EXAM: PORTABLE CHEST 1 VIEW COMPARISON:  08/21/2016 FINDINGS: Support devices are stable. Cardiomegaly. Bilateral perihilar and lower lobe opacities throughout the lungs have increased slightly since prior study, likely mild edema. Suspect small layering effusions. IMPRESSION: Mild interval worsening of pulmonary edema/ CHF. Small layering effusions. Electronically Signed   By: Rolm Baptise M.D.   On: 08/22/2016 07:30   Dg Chest Port 1 View  Result Date: 08/21/2016 CLINICAL DATA:  Acute hypoxemic respiratory failure EXAM: PORTABLE CHEST 1 VIEW COMPARISON:  08/20/2016 FINDINGS: Support devices  are stable. Endotracheal tube remains at the level of the carina. Borderline heart size. Bilateral airspace opacities are again noted, likely edema. Findings stable or slightly worsened since prior study. Possible small right effusion. IMPRESSION: Slight interval worsening in pulmonary edema pattern. Small right effusion. Endotracheal tube remains at the level of the carina and could be retracted 2-3 cm for optimal positioning. Electronically Signed   By: Rolm Baptise M.D.   On: 08/21/2016 07:40   Dg Chest Port 1 View  Result Date: 08/20/2016 CLINICAL DATA:  Pt was hypotensive with respiratory failure x a few days ago. Hx of DM, and HTN. Pt had a cardiac cath on 08-15-16. EXAM: PORTABLE CHEST 1 VIEW COMPARISON:  08/19/2016 FINDINGS: Endotracheal to appears low at the level of the carina. LEFT and RIGHT central venous line are unchanged. Swan-Ganz catheter from an inferior approach unchanged. There is some improvement aeration lung bases. Mild pulmonary edema remains. No pneumothorax. IMPRESSION: 1. Endotracheal tube appears low at the level the carina. 2. Stable support apparatus. 3. Slight improved aeration lung bases with persistent mild edema. Electronically Signed   By: Suzy Bouchard M.D.   On: 08/20/2016 07:58   Dg Chest Port 1 View  Result Date: 08/19/2016 CLINICAL DATA:  Pulmonary edema EXAM: PORTABLE CHEST 1 VIEW COMPARISON:  08/18/2016 FINDINGS: External pad artifact. Endotracheal tube, central venous lines, and NG tube unchanged. Swan-Ganz catheter and intra aortic balloon pump unchanged. Stable cardiac silhouette. Mild central venous pulmonary congestion. No overt pulmonary edema. No pneumothorax. IMPRESSION: 1. Stable support apparatus. 2. Central venous congestion.  No significant change. Electronically Signed   By: Suzy Bouchard M.D.   On: 08/19/2016 07:55   Dg Chest Port 1 View  Result Date: 08/18/2016 CLINICAL DATA:  Placement of femoral Swan-Ganz catheter and intra-aortic balloon  pump catheter. EXAM: PORTABLE CHEST 1 VIEW 4:14 p.m.: COMPARISON:  Portable chest x-ray earlier today 3:37 p.m. and previously. FINDINGS: Femoral Swan-Ganz catheter tip projects at the expected location of the right middle lobe pulmonary artery. Intra-aortic balloon pump catheter tip projects over the proximal descending thoracic aorta. Endotracheal tube tip now projects approximately 2-3 cm above the carina. Right jugular central venous catheter tip projects over the upper SVC. Left jugular central venous catheter tip projects over the lower SVC. Nasogastric tube courses below  the diaphragm into the stomach. Improved aeration in the left lower lobe since the most recent examination 45 minutes earlier, though streaky opacities persist. Improved perihilar airspace pulmonary edema in both lungs. No new pulmonary parenchymal abnormalities. IMPRESSION: 1. Swan-Ganz catheter tip projects at the expected location of the right middle lobe pulmonary artery. 2. Intra-aortic balloon pump catheter tip projects over the proximal descending thoracic aorta. 3. Remaining support apparatus satisfactory. 4. Improved aeration in both lungs since the examination performed 45 minutes ago, although streaky left lower lobe atelectasis and/or pneumonia and mild perihilar airspace pulmonary edema persists. 5. No new abnormalities. Electronically Signed   By: Evangeline Dakin M.D.   On: 08/18/2016 16:43   Dg Chest Port 1 View  Result Date: 08/18/2016 CLINICAL DATA:  ST-elevation myocardial infarction and cardiogenic shock. EXAM: PORTABLE CHEST 1 VIEW COMPARISON:  Yesterday FINDINGS: Endotracheal tube tip 1 cm above the carina. Central line on the right and dialysis catheter on the left with tips at the lower SVC. Orogastric tube reaches the stomach at least. Stable borderline heart size. Worsening aeration at the left base with newly obscured diaphragm. Perihilar airspace disease. IMPRESSION: 1. Lower endotracheal tube with tip 1 cm  above the carina. 2. New atelectasis or consolidation in the left lower lobe with small effusion. 3. Pulmonary edema. Electronically Signed   By: Monte Fantasia M.D.   On: 08/18/2016 07:02   Dg Chest Port 1 View  Result Date: 08/17/2016 CLINICAL DATA:  Central line placement EXAM: PORTABLE CHEST 1 VIEW COMPARISON:  Earlier same day FINDINGS: Endotracheal tube is been withdrawn with the tip 2.5 cm proximal to the carina. Right internal jugular central line remains in place with its tip in the SVC above the right atrium. New dual-lumen left internal jugular central line has its tip in the SVC above the right atrium. No pneumothorax. Bilateral bat wing airspace filling is seen consistent with acute edema or acute airspace filling due to other etiologies. No effusions. No collapse. IMPRESSION: Left IJ central line well position with tip in the SVC above the right atrium. No pneumothorax. Endotracheal tube repositioned, well positioned with tip 2.5 cm above the carina. Persistent worsen bat wing edema or airspace filling pattern. Electronically Signed   By: Nelson Chimes M.D.   On: 08/17/2016 10:35   Dg Chest Port 1 View  Result Date: 08/15/2016 CLINICAL DATA:  Central line placement EXAM: PORTABLE CHEST 1 VIEW COMPARISON:  August 15, 2016 7:04 p.m. FINDINGS: Right central venous line is identified with distal tip in superior vena cava. There is no pneumothorax. The heart size mildly enlarged. Diffuse airspace opacities identified throughout both lungs. The bones are unchanged. IMPRESSION: Right central venous line distal tip in the superior vena cava. There is no pneumothorax. Pulmonary edema unchanged compared prior exam. Electronically Signed   By: Abelardo Diesel M.D.   On: 08/15/2016 21:10   Dg Abd Portable 1v  Result Date: 08/19/2016 CLINICAL DATA:  Acidosis EXAM: PORTABLE ABDOMEN - 1 VIEW COMPARISON:  08/17/2016 FINDINGS: No dilated loops large or small bowel. NG tube in stomach. Swan-Ganz catheter  noted LEFT basilar atelectasis. The kidneys are dense suggesting retained IV contrast . IMPRESSION: 1. Normal bowel-gas pattern. 2. Retained contrast in the kidneys consistent consistent with acute kidney injury. Electronically Signed   By: Suzy Bouchard M.D.   On: 08/19/2016 08:02    PHYSICAL EXAM General: Intubated/sedated Neck: No JVD, no thyromegaly or thyroid nodule.  Lungs: Crackles dependently.  CV: Nondisplaced PMI.  Heart  regular S1/S2, no S3/S4, 3/6 HSM apex.  No peripheral edema.   Abdomen: Soft, no hepatosplenomegaly, no distention.  Neurologic: Sedated.   Extremities: No clubbing or cyanosis. Right groin site w/o hematoma.   TELEMETRY: Reviewed telemetry pt in NSR   Assessment:   1. Cardiogenic shock: Due to acute severe MR.  2. CAD: Inferior posterior STEMI on 9/13 with DES x 2 to OM-1  3. 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  4. AKI on CKD stage III - due to ATN likely from cardiogenic shock + contrast with cath. Has required CVVH.  5. Acute respiratory failure: Pulmonary edema.  6. DM2 7. Suspect component of septic shock: Possible PNA, group B Strep in sputum culture.  8. Anemia: Transfused 1 unit 9/17.    Plan/Discussion:     She is critically ill with cardiogenic shock in setting of inferior STEMI c/b cardiogenic shock, severe ischemic MR due to ruptured papillary muscle and AKI.   IABP remains 1:1, good cardiac output. Off norepi off.  Suspect component of septic (vasodilatory) shock now (low grade fever still to 100.2 max, Strep PNA).      - Continue IABP, likely will need in given acute severe MR until surgical repair of MR.  Keep Swan in for now.  Dr Darcey Nora at bedside. Plan for surgery in am. Continue IABP and Swan Off pressors.   PCT elevated, possible Strep PNA/sepsis.  Continue vanco/Zosyn.    Last dose of Brilinta on 9/15, now on tirofiban bridge as we wait for  Brilinta wash-out prior to MV surgery.   Anemia.  No definite site for bleeding, groin looks ok.  ?hemolysis though bilirubin normal.  Got 1 unit PRBCs 9/17.  Todays hemoglobin is 8.7    Yesterday started on CVVH. Pulling 75 per hour.     Continue heparin for IABP.   Amy Clegg NP-C  08/22/2016 7:50 AM   Patient seen and examined with Darrick Grinder, NP. We discussed all aspects of the encounter. I agree with the assessment and plan as stated above.   Luiz Blare numbers reviewed personally. Hemodynamics this am looked good off pressors. But today developed marked tachycardia and ST depression when sedation was lightened. There was some question of AFL but suspect sinus tach. Given amiodarone and sedation increased. Norepi restarted. I also repositioned IABP.  Now more stable.   Hemoglobin stable on heparin (for IABP) and aggrastat (for recent stent). On vanc/zosyn for strep sepsis.   Remains on CVVHD. Weight up. Will continue to pull as tolerated.  For MVR tomorrow.   D/w Dr. Prescott Gum at bedside.   The patient is critically ill with multiple organ systems failure and requires high complexity decision making for assessment and support, frequent evaluation and titration of therapies, application of advanced monitoring technologies and extensive interpretation of multiple databases.   Critical Care Time devoted to patient care services described in this note is 35 Minutes.  Natalyia Innes,MD 3:21 PM

## 2016-08-22 NOTE — Progress Notes (Signed)
   Called by nursing staff. Sedation was off this morning.   Agitated. Given Fentanyl. Heart Rate 140-150s. EKG now. Maps 58-60s. SBP by A-Line 70-80s.   Check CBC, BMEt, CO-OX now.     A fib- Give 150 mg amio bolus now. Load Amio. Can cardiovert if she doesn't convert.   Start norepi to maintain SBP > 90   Dr Haroldine Laws at bedside and agrees with plan.    Amy Clegg Np-C  1:47 PM   Arber Wiemers,MD 11:10 PM

## 2016-08-22 NOTE — Progress Notes (Signed)
[facility] Progress Note    Subjective:    CRRT started 9/18 Pulling 50-75 net neg/hour and tolerating Off pressor support since yesterday UOP has dropped off  Per nursing, Dr. Prescott Gum plans MV surgery in the AM 9/20    Objective:   BP (!) 104/59 (BP Location: Right Arm)   Pulse 96   Temp 97.5 F (36.4 C) (Core (Comment))   Resp 19   Ht _0  (1.549 m) Comment: from 08/15/16 encounter  Wt 80.5 kg (177 lb 7.5 oz)   SpO2 100%   BMI 33.53 kg/m   Intake/Output Summary (Last 24 hours) at 08/22/16 9381 Last data filed at 08/22/16 0700  Gross per 24 hour  Intake          2023.37 ml  Output             3491 ml  Net         -1467.63 ml   Weight trending  08/22/16  79.3  kg   08/21/16 0500  78.7 kg (173 lb  CRRT initiated  08/20/16 0500  77.9 kg (171 lb    08/19/16 0500  72.5 kg (159 lb 1   08/18/16 1709  71.2 kg (156 lb 1   08/18/16 1457  72.9 kg (160 lb 1     Physical Exam: Intubated, sedated, on IABP 1:1 BP (!) 104/59 (BP Location: Right Arm)   Pulse 96   Temp 97.5 F (36.4 C) (Core (Comment))   Resp 19   Ht _1  (1.549 m) Comment: from 08/15/16 encounter  Wt 80.5 kg (177 lb 7.5 oz)   SpO2 100%   BMI 33.53 kg/m   CVP 9 PAP 37/23 PCWP 18 CI 2.8 VS as noted R triple lumen/SGC, left HD cath (placed 9/14 at [facility]), left fem art line, R fem IAPB Regular tachycardia, 3/6 pansystolic murmur at apex Lungs ant clear Abd: soft, obese, nontender Trace-1+ edema bilat LE's and UE's - perhaps some better this AM Dressings on both heels Foley minimal amt yellow urine  Imaging: Dg Chest Port 1 View  Result Date: 08/22/2016 CLINICAL DATA:  Respiratory failure, shortness of Breath EXAM: PORTABLE CHEST 1 VIEW COMPARISON:  08/21/2016 FINDINGS: Support devices are stable. Cardiomegaly. Bilateral perihilar and lower lobe opacities throughout the lungs have increased slightly since prior study, likely mild edema. Suspect small layering effusions. IMPRESSION:  Mild interval worsening of pulmonary edema/ CHF. Small layering effusions. Electronically Signed   By: Rolm Baptise M.D.   On: 08/22/2016 07:30   Dg Chest Port 1 View  Result Date: 08/21/2016 CLINICAL DATA:  Acute hypoxemic respiratory failure EXAM: PORTABLE CHEST 1 VIEW COMPARISON:  08/20/2016 FINDINGS: Support devices are stable. Endotracheal tube remains at the level of the carina. Borderline heart size. Bilateral airspace opacities are again noted, likely edema. Findings stable or slightly worsened since prior study. Possible small right effusion. IMPRESSION: Slight interval worsening in pulmonary edema pattern. Small right effusion. Endotracheal tube remains at the level of the carina and could be retracted 2-3 cm for optimal positioning. Electronically Signed   By: Rolm Baptise M.D.   On: 08/21/2016 07:40    Labs: BMET  Recent Labs Lab 08/17/16 0431 08/17/16 1600 08/17/16 1601 08/18/16 0518 08/18/16 0911 08/18/16 1500 08/19/16 0410 08/20/16 0500 08/21/16 0436 08/21/16 1600 08/22/16 0315  NA 138 129*  --  137  136 135 135 137 136 135 135 137  K 3.1* 3.8  --  4.3  4.3 4.3 4.7 4.1  4.1 5.0 4.6 4.3  CL 114* 103  --  106  105 104 105 106 106 105 105 106  CO2 13* 20*  --  _0 20* 23 22 20* 23 22  GLUCOSE 195* 423*  --  203*  200* 210* 355* 163* 161* 272* 263* 221*  BUN 38* 37*  --  34*  32* 31* 39* 43* 58* 69* 64* 49*  CREATININE 3.24* 3.38*  --  2.29*  2.31* 2.09* 2.77* 3.12* 3.94* 4.96* 4.26* 3.11*  CALCIUM 6.7* 7.6*  --  8.4*  8.2* 8.0* 8.1* 7.7* 7.3* 7.4* 7.3* 7.2*  PHOS 5.0* 4.0 4.2 2.2*  2.1*  --  2.7  --   --   --  2.9 2.3*   CBC  Recent Labs Lab 08/19/16 0410 08/20/16 0500  08/20/16 1329 08/21/16 0436 08/21/16 1600 08/22/16 0315  WBC 14.6* 12.6*  < > 13.1* 14.2* 14.1* 15.6*  NEUTROABS 10.0* 9.5*  --   --  10.4*  --  11.3*  HGB 9.6* 7.5*  < > 8.5* 8.2* 8.6* 8.7*  HCT 29.7* 22.9*  < > 26.4* 25.9* 26.7* 27.0*  MCV 85.1 85.4  < > 87.7 87.8 87.3 86.5   PLT 258 230  < > 216 235 249 262  < > = values in this interval not displayed.  Medications:    . aspirin  81 mg Oral Daily  . atorvastatin  80 mg Oral Daily  . cefTRIAXone (ROCEPHIN)  IV  2 g Intravenous Q24H  . [START ON 08/23/2016] chlorhexidine  15 mL Mouth/Throat Once  . chlorhexidine gluconate (MEDLINE KIT)  15 mL Mouth Rinse BID  . Chlorhexidine Gluconate Cloth  6 each Topical Once   And  . Chlorhexidine Gluconate Cloth  6 each Topical Once  . insulin aspart  0-9 Units Subcutaneous Q4H  . insulin glargine  35 Units Subcutaneous QHS  . mouth rinse  15 mL Mouth Rinse 10 times per day  . [START ON 08/23/2016] metoprolol tartrate  12.5 mg Oral Once  . pantoprazole sodium  40 mg Per Tube Q24H   Infusions:  . sodium chloride 10 mL/hr at 08/21/16 1250  . feeding supplement (VITAL AF 1.2 CAL) 1,000 mL (08/21/16 2051)  . fentaNYL infusion INTRAVENOUS    . heparin 500 Units/hr (08/22/16 0616)  . dialysis replacement fluid (prismasate) 300 mL/hr at 08/22/16 0345  . dialysis replacement fluid (prismasate) 200 mL/hr at 08/21/16 1036  . dialysate (PRISMASATE) 1,000 mL/hr at 08/22/16 0655  . tirofiban     Background:  60 y/o woman with DM2, HTN, HL and noncompliance presented to [facility] ER with infrerior posterior STEMI on 9/12->cath->. PCS with stent x 2 by Dr. Clayborn Bigness. EF 55-60% with severe MR confirmed by echo.  Post-cath developed respiratory failure and required intubation. Developed shock and renal failure (creatinine 1.6 on adm, prior to that only creatinine available was 1.1 2015). Trialysis catheter placed and pt had CRRT at [facility] for 48 hours prior to transfer here on 08/18/16->cath lab -> found to have ruptured posterior papillary muscle with severe MR->IABP/Swan. We were asked to see. D/T progressive rise in creatinine and volume excess with plan for mitral valve surgery, CRRT re-initiated 08/21/16.   Assessment/ Plan:    Acute renal failure on chronic kidney disease stage III:   Creatinine 1.6 on admission to [facility] (prior to that only creatinine 1.1 07/2014) AKI from cardiogenic shock with evolution to ATN and contrast nephropathy after emergent coronary angiogram/intervention at [facility] and  rec'd CRRT 48 hours at [facility] prior to transfer In preparation for MV surgery, CRRT re-initiated 9/18  Neg 50-75 ml/hour has been well tolerated, but pulm edema worse on CXR and filling pressures unchanged - increase to net neg 100-125/hour as tolerated Continue with all 4K fluids  No heparin (on systemic + tirofiban) Phos already trending down -> add Q12H phos replacement 10 mmoles  Cardiogenic shock status post acute inferopost STEMI/posterior papillary muscle/Severe MR: On pressors and IABP MV replacement planned for tomorrow   Acute MI Status post emergent coronary angiogram and PCI with 2 stents to OM1 branch.  Anemia S/p PRBC's X 1 9/17.  VDRF Secondary to cardiogenic shock, continue ventilator management per CCM.  Group B streptococci + sputum cultures V/Z changed to rocephin   Jamal Maes, MD [place] Pager 08/22/2016, 8:22 AM   .

## 2016-08-22 NOTE — Progress Notes (Signed)
15 ml of Versed wasted in sink. Witnessed by two RN's Debbrah Alar and [employer]

## 2016-08-23 ENCOUNTER — Inpatient Hospital Stay (HOSPITAL_COMMUNITY): Payer: Medicaid Other

## 2016-08-23 ENCOUNTER — Encounter: Payer: Self-pay | Admitting: Internal Medicine

## 2016-08-23 ENCOUNTER — Inpatient Hospital Stay (HOSPITAL_COMMUNITY): Payer: Medicaid Other | Admitting: Anesthesiology

## 2016-08-23 ENCOUNTER — Encounter (HOSPITAL_COMMUNITY)
Admission: EM | Disposition: A | Discharge status: Rehabilitation Facility | Payer: Self-pay | Source: Other Acute Inpatient Hospital | Attending: Cardiothoracic Surgery

## 2016-08-23 DIAGNOSIS — I340 Nonrheumatic mitral (valve) insufficiency: Secondary | ICD-10-CM | POA: Diagnosis present

## 2016-08-23 HISTORY — PX: MITRAL VALVE REPAIR: SHX2039

## 2016-08-23 HISTORY — PX: ENDOVEIN HARVEST OF GREATER SAPHENOUS VEIN: SHX5059

## 2016-08-23 HISTORY — PX: CARDIAC CATHETERIZATION: SHX172

## 2016-08-23 HISTORY — PX: INTRAOPERATIVE TRANSESOPHAGEAL ECHOCARDIOGRAM: SHX5062

## 2016-08-23 LAB — POCT I-STAT, CHEM 8
BUN: 31 mg/dL — AB (ref 6–20)
BUN: 29 mg/dL — ABNORMAL HIGH (ref 6–20)
BUN: 32 mg/dL — AB (ref 6–20)
BUN: 30 mg/dL — ABNORMAL HIGH (ref 6–20)
BUN: 31 mg/dL — ABNORMAL HIGH (ref 6–20)
BUN: 34 mg/dL — ABNORMAL HIGH (ref 6–20)
BUN: 32 mg/dL — AB (ref 6–20)
CALCIUM ION: 1.12 mmol/L — AB (ref 1.15–1.40)
CALCIUM ION: 0.96 mmol/L — AB (ref 1.15–1.40)
CALCIUM ION: 1.1 mmol/L — AB (ref 1.15–1.40)
CALCIUM ION: 1.1 mmol/L — AB (ref 1.15–1.40)
CALCIUM ION: 1.22 mmol/L (ref 1.15–1.40)
CHLORIDE: 101 mmol/L (ref 101–111)
CHLORIDE: 102 mmol/L (ref 101–111)
CHLORIDE: 107 mmol/L (ref 101–111)
CREATININE: 2.5 mg/dL — AB (ref 0.44–1.00)
Calcium, Ion: 0.99 mmol/L — ABNORMAL LOW (ref 1.15–1.40)
Calcium, Ion: 1 mmol/L — ABNORMAL LOW (ref 1.15–1.40)
Chloride: 100 mmol/L — ABNORMAL LOW (ref 101–111)
Chloride: 103 mmol/L (ref 101–111)
Chloride: 105 mmol/L (ref 101–111)
Chloride: 106 mmol/L (ref 101–111)
Creatinine, Ser: 2.2 mg/dL — ABNORMAL HIGH (ref 0.44–1.00)
Creatinine, Ser: 2.5 mg/dL — ABNORMAL HIGH (ref 0.44–1.00)
Creatinine, Ser: 2.1 mg/dL — ABNORMAL HIGH (ref 0.44–1.00)
Creatinine, Ser: 2.3 mg/dL — ABNORMAL HIGH (ref 0.44–1.00)
Creatinine, Ser: 2.3 mg/dL — ABNORMAL HIGH (ref 0.44–1.00)
Creatinine, Ser: 2.6 mg/dL — ABNORMAL HIGH (ref 0.44–1.00)
GLUCOSE: 167 mg/dL — AB (ref 65–99)
GLUCOSE: 131 mg/dL — AB (ref 65–99)
Glucose, Bld: 141 mg/dL — ABNORMAL HIGH (ref 65–99)
Glucose, Bld: 153 mg/dL — ABNORMAL HIGH (ref 65–99)
Glucose, Bld: 177 mg/dL — ABNORMAL HIGH (ref 65–99)
Glucose, Bld: 162 mg/dL — ABNORMAL HIGH (ref 65–99)
Glucose, Bld: 184 mg/dL — ABNORMAL HIGH (ref 65–99)
HCT: 29 % — ABNORMAL LOW (ref 36.0–46.0)
HCT: 25 % — ABNORMAL LOW (ref 36.0–46.0)
HCT: 26 % — ABNORMAL LOW (ref 36.0–46.0)
HEMATOCRIT: 23 % — AB (ref 36.0–46.0)
HEMATOCRIT: 27 % — AB (ref 36.0–46.0)
HEMATOCRIT: 29 % — AB (ref 36.0–46.0)
HEMATOCRIT: 22 % — AB (ref 36.0–46.0)
HEMOGLOBIN: 9.2 g/dL — AB (ref 12.0–15.0)
HEMOGLOBIN: 9.9 g/dL — AB (ref 12.0–15.0)
Hemoglobin: 9.9 g/dL — ABNORMAL LOW (ref 12.0–15.0)
Hemoglobin: 7.8 g/dL — ABNORMAL LOW (ref 12.0–15.0)
Hemoglobin: 8.5 g/dL — ABNORMAL LOW (ref 12.0–15.0)
Hemoglobin: 7.5 g/dL — ABNORMAL LOW (ref 12.0–15.0)
Hemoglobin: 8.8 g/dL — ABNORMAL LOW (ref 12.0–15.0)
POTASSIUM: 4.9 mmol/L (ref 3.5–5.1)
POTASSIUM: 4.5 mmol/L (ref 3.5–5.1)
POTASSIUM: 4.6 mmol/L (ref 3.5–5.1)
Potassium: 4.6 mmol/L (ref 3.5–5.1)
Potassium: 3.9 mmol/L (ref 3.5–5.1)
Potassium: 4.4 mmol/L (ref 3.5–5.1)
Potassium: 4.4 mmol/L (ref 3.5–5.1)
SODIUM: 137 mmol/L (ref 135–145)
SODIUM: 136 mmol/L (ref 135–145)
SODIUM: 138 mmol/L (ref 135–145)
SODIUM: 135 mmol/L (ref 135–145)
SODIUM: 137 mmol/L (ref 135–145)
SODIUM: 140 mmol/L (ref 135–145)
SODIUM: 136 mmol/L (ref 135–145)
TCO2: 25 mmol/L (ref 0–100)
TCO2: 23 mmol/L (ref 0–100)
TCO2: 28 mmol/L (ref 0–100)
TCO2: 25 mmol/L (ref 0–100)
TCO2: 24 mmol/L (ref 0–100)
TCO2: 25 mmol/L (ref 0–100)
TCO2: 23 mmol/L (ref 0–100)

## 2016-08-23 LAB — CBC WITH DIFFERENTIAL/PLATELET
BASOS PCT: 0 %
Basophils Absolute: 0 10*3/uL (ref 0.0–0.1)
EOS PCT: 2 %
Eosinophils Absolute: 0.5 10*3/uL (ref 0.0–0.7)
HEMATOCRIT: 28.4 % — AB (ref 36.0–46.0)
Hemoglobin: 9.3 g/dL — ABNORMAL LOW (ref 12.0–15.0)
LYMPHS ABS: 2.3 10*3/uL (ref 0.7–4.0)
Lymphocytes Relative: 10 %
MCH: 28.7 pg (ref 26.0–34.0)
MCHC: 32.7 g/dL (ref 30.0–36.0)
MCV: 87.7 fL (ref 78.0–100.0)
MONO ABS: 4.1 10*3/uL — AB (ref 0.1–1.0)
Monocytes Relative: 18 %
NEUTROS ABS: 15.6 10*3/uL — AB (ref 1.7–7.7)
Neutrophils Relative %: 70 %
Platelets: 351 10*3/uL (ref 150–400)
RBC: 3.24 MIL/uL — ABNORMAL LOW (ref 3.87–5.11)
RDW: 15.1 % (ref 11.5–15.5)
WBC: 22.5 10*3/uL — ABNORMAL HIGH (ref 4.0–10.5)

## 2016-08-23 LAB — POCT I-STAT 3, ART BLOOD GAS (G3+)
ACID-BASE DEFICIT: 2 mmol/L (ref 0.0–2.0)
ACID-BASE DEFICIT: 6 mmol/L — AB (ref 0.0–2.0)
Acid-base deficit: 3 mmol/L — ABNORMAL HIGH (ref 0.0–2.0)
Acid-base deficit: 5 mmol/L — ABNORMAL HIGH (ref 0.0–2.0)
BICARBONATE: 22.6 mmol/L (ref 20.0–28.0)
Bicarbonate: 24.1 mmol/L (ref 20.0–28.0)
Bicarbonate: 25.2 mmol/L (ref 20.0–28.0)
Bicarbonate: 22 mmol/L (ref 20.0–28.0)
Bicarbonate: 22 mmol/L (ref 20.0–28.0)
O2 SAT: 100 %
O2 SAT: 100 %
O2 SAT: 95 %
O2 Saturation: 100 %
O2 Saturation: 84 %
PCO2 ART: 46.1 mmHg (ref 32.0–48.0)
PCO2 ART: 43.6 mmHg (ref 32.0–48.0)
PH ART: 7.351 (ref 7.350–7.450)
PH ART: 7.323 — AB (ref 7.350–7.450)
PH ART: 7.197 — AB (ref 7.350–7.450)
PO2 ART: 298 mmHg — AB (ref 83.0–108.0)
TCO2: 26 mmol/L (ref 0–100)
TCO2: 27 mmol/L (ref 0–100)
TCO2: 24 mmol/L (ref 0–100)
TCO2: 24 mmol/L (ref 0–100)
TCO2: 23 mmol/L (ref 0–100)
pCO2 arterial: 45.4 mmHg (ref 32.0–48.0)
pCO2 arterial: 55.6 mmHg — ABNORMAL HIGH (ref 32.0–48.0)
pCO2 arterial: 47.8 mmHg (ref 32.0–48.0)
pH, Arterial: 7.328 — ABNORMAL LOW (ref 7.350–7.450)
pH, Arterial: 7.267 — ABNORMAL LOW (ref 7.350–7.450)
pO2, Arterial: 231 mmHg — ABNORMAL HIGH (ref 83.0–108.0)
pO2, Arterial: 436 mmHg — ABNORMAL HIGH (ref 83.0–108.0)
pO2, Arterial: 57 mmHg — ABNORMAL LOW (ref 83.0–108.0)
pO2, Arterial: 82 mmHg — ABNORMAL LOW (ref 83.0–108.0)

## 2016-08-23 LAB — CULTURE, BLOOD (ROUTINE X 2)
Culture: NO GROWTH
Culture: NO GROWTH

## 2016-08-23 LAB — PLATELET COUNT: Platelets: 168 10*3/uL (ref 150–400)

## 2016-08-23 LAB — CARBOXYHEMOGLOBIN
Carboxyhemoglobin: 1 % (ref 0.5–1.5)
Carboxyhemoglobin: 0.9 % (ref 0.5–1.5)
Methemoglobin: 0.6 % (ref 0.0–1.5)
Methemoglobin: 0.9 % (ref 0.0–1.5)
O2 Saturation: 40.9 %
O2 Saturation: 71 %
Total hemoglobin: 10.6 g/dL — ABNORMAL LOW (ref 12.0–16.0)
Total hemoglobin: 9.2 g/dL — ABNORMAL LOW (ref 12.0–16.0)

## 2016-08-23 LAB — POCT I-STAT 4, (NA,K, GLUC, HGB,HCT)
GLUCOSE: 151 mg/dL — AB (ref 65–99)
HEMATOCRIT: 27 % — AB (ref 36.0–46.0)
Hemoglobin: 9.2 g/dL — ABNORMAL LOW (ref 12.0–15.0)
POTASSIUM: 4 mmol/L (ref 3.5–5.1)
Sodium: 140 mmol/L (ref 135–145)

## 2016-08-23 LAB — COMPREHENSIVE METABOLIC PANEL
ALT: 37 U/L (ref 14–54)
ANION GAP: 11 (ref 5–15)
AST: 55 U/L — AB (ref 15–41)
Albumin: 1.6 g/dL — ABNORMAL LOW (ref 3.5–5.0)
Alkaline Phosphatase: 113 U/L (ref 38–126)
BILIRUBIN TOTAL: 0.5 mg/dL (ref 0.3–1.2)
BUN: 32 mg/dL — AB (ref 6–20)
CO2: 24 mmol/L (ref 22–32)
Calcium: 7.7 mg/dL — ABNORMAL LOW (ref 8.9–10.3)
Chloride: 100 mmol/L — ABNORMAL LOW (ref 101–111)
Creatinine, Ser: 2.33 mg/dL — ABNORMAL HIGH (ref 0.44–1.00)
GFR, EST AFRICAN AMERICAN: 25 mL/min — AB (ref >60)
GFR, EST NON AFRICAN AMERICAN: 22 mL/min — AB (ref >60)
Glucose, Bld: 186 mg/dL — ABNORMAL HIGH (ref 65–99)
POTASSIUM: 4.4 mmol/L (ref 3.5–5.1)
Sodium: 135 mmol/L (ref 135–145)
TOTAL PROTEIN: 6.2 g/dL — AB (ref 6.5–8.1)

## 2016-08-23 LAB — GLUCOSE, CAPILLARY
GLUCOSE-CAPILLARY: 166 mg/dL — AB (ref 65–99)
GLUCOSE-CAPILLARY: 154 mg/dL — AB (ref 65–99)
GLUCOSE-CAPILLARY: 140 mg/dL — AB (ref 65–99)
Glucose-Capillary: 105 mg/dL — ABNORMAL HIGH (ref 65–99)
Glucose-Capillary: 128 mg/dL — ABNORMAL HIGH (ref 65–99)
Glucose-Capillary: 146 mg/dL — ABNORMAL HIGH (ref 65–99)
Glucose-Capillary: 158 mg/dL — ABNORMAL HIGH (ref 65–99)

## 2016-08-23 LAB — BLOOD GAS, ARTERIAL
Acid-base deficit: 0.6 mmol/L (ref 0.0–2.0)
Bicarbonate: 24.4 mmol/L (ref 20.0–28.0)
Drawn by: 252031
FIO2: 40
MECHVT: 450 mL
O2 Saturation: 96.7 %
PEEP: 5 cmH2O
Patient temperature: 98.6
RATE: 16 resp/min
pCO2 arterial: 46.8 mmHg (ref 32.0–48.0)
pH, Arterial: 7.337 — ABNORMAL LOW (ref 7.350–7.450)
pO2, Arterial: 93.5 mmHg (ref 83.0–108.0)

## 2016-08-23 LAB — PREPARE RBC (CROSSMATCH)

## 2016-08-23 LAB — CBC
HCT: 23.8 % — ABNORMAL LOW (ref 36.0–46.0)
HCT: 27.8 % — ABNORMAL LOW (ref 36.0–46.0)
HEMOGLOBIN: 7.9 g/dL — AB (ref 12.0–15.0)
Hemoglobin: 9.1 g/dL — ABNORMAL LOW (ref 12.0–15.0)
MCH: 29.3 pg (ref 26.0–34.0)
MCH: 29.2 pg (ref 26.0–34.0)
MCHC: 33.2 g/dL (ref 30.0–36.0)
MCHC: 32.7 g/dL (ref 30.0–36.0)
MCV: 88.1 fL (ref 78.0–100.0)
MCV: 89.1 fL (ref 78.0–100.0)
Platelets: 194 10*3/uL (ref 150–400)
Platelets: 188 10*3/uL (ref 150–400)
RBC: 2.7 MIL/uL — ABNORMAL LOW (ref 3.87–5.11)
RBC: 3.12 MIL/uL — ABNORMAL LOW (ref 3.87–5.11)
RDW: 14.7 % (ref 11.5–15.5)
RDW: 14.7 % (ref 11.5–15.5)
WBC: 21.8 10*3/uL — ABNORMAL HIGH (ref 4.0–10.5)
WBC: 21 10*3/uL — ABNORMAL HIGH (ref 4.0–10.5)

## 2016-08-23 LAB — APTT: aPTT: 40 seconds — ABNORMAL HIGH (ref 24–36)

## 2016-08-23 LAB — RENAL FUNCTION PANEL
ANION GAP: 10 (ref 5–15)
Albumin: 1.6 g/dL — ABNORMAL LOW (ref 3.5–5.0)
BUN: 32 mg/dL — ABNORMAL HIGH (ref 6–20)
CO2: 23 mmol/L (ref 22–32)
Calcium: 7.8 mg/dL — ABNORMAL LOW (ref 8.9–10.3)
Chloride: 102 mmol/L (ref 101–111)
Creatinine, Ser: 2.31 mg/dL — ABNORMAL HIGH (ref 0.44–1.00)
GFR calc Af Amer: 25 mL/min — ABNORMAL LOW (ref >60)
GFR calc non Af Amer: 22 mL/min — ABNORMAL LOW (ref >60)
GLUCOSE: 184 mg/dL — AB (ref 65–99)
PHOSPHORUS: 3.5 mg/dL (ref 2.5–4.6)
POTASSIUM: 4.6 mmol/L (ref 3.5–5.1)
Sodium: 135 mmol/L (ref 135–145)

## 2016-08-23 LAB — HEMOGLOBIN AND HEMATOCRIT, BLOOD
HCT: 22.7 % — ABNORMAL LOW (ref 36.0–46.0)
Hemoglobin: 7.4 g/dL — ABNORMAL LOW (ref 12.0–15.0)

## 2016-08-23 LAB — CREATININE, SERUM
Creatinine, Ser: 2.76 mg/dL — ABNORMAL HIGH (ref 0.44–1.00)
GFR calc Af Amer: 21 mL/min — ABNORMAL LOW (ref >60)
GFR calc non Af Amer: 18 mL/min — ABNORMAL LOW (ref >60)

## 2016-08-23 LAB — PROTIME-INR
INR: 1.5
PROTHROMBIN TIME: 18.2 s — AB (ref 11.4–15.2)

## 2016-08-23 LAB — HEPARIN LEVEL (UNFRACTIONATED): HEPARIN UNFRACTIONATED: 0.25 [IU]/mL — AB (ref 0.30–0.70)

## 2016-08-23 LAB — MAGNESIUM
Magnesium: 2.1 mg/dL (ref 1.7–2.4)
Magnesium: 2.1 mg/dL (ref 1.7–2.4)

## 2016-08-23 SURGERY — REPAIR, MITRAL VALVE
Anesthesia: General | Site: Neck | Laterality: Right

## 2016-08-23 MED ORDER — SODIUM BICARBONATE 8.4 % IV SOLN
50.0000 meq | Freq: Once | INTRAVENOUS | Status: AC
  Administered 2016-08-23: 50 meq via INTRAVENOUS

## 2016-08-23 MED ORDER — VANCOMYCIN HCL 500 MG IV SOLR
500.0000 mg | INTRAVENOUS | Status: AC
  Administered 2016-08-23 – 2016-08-24 (×2): 500 mg via INTRAVENOUS
  Filled 2016-08-23 (×2): qty 500

## 2016-08-23 MED ORDER — OXYCODONE HCL 5 MG PO TABS: 5.0000 mg | ORAL_TABLET | ORAL | Status: DC | PRN

## 2016-08-23 MED ORDER — VANCOMYCIN HCL IN DEXTROSE 1-5 GM/200ML-% IV SOLN
1000.0000 mg | Freq: Once | INTRAVENOUS | Status: DC
  Filled 2016-08-23: qty 200

## 2016-08-23 MED ORDER — THROMBIN 5000 UNITS EX SOLR
CUTANEOUS | Status: DC | PRN
  Administered 2016-08-23: 5000 [IU] via TOPICAL

## 2016-08-23 MED ORDER — FENTANYL CITRATE (PF) 250 MCG/5ML IJ SOLN
INTRAMUSCULAR | Status: DC | PRN
  Administered 2016-08-23: 100 ug via INTRAVENOUS
  Administered 2016-08-23: 200 ug via INTRAVENOUS
  Administered 2016-08-23: 100 ug via INTRAVENOUS

## 2016-08-23 MED ORDER — CHLORHEXIDINE GLUCONATE 0.12 % MT SOLN
15.0000 mL | OROMUCOSAL | Status: AC
  Administered 2016-08-23: 15 mL via OROMUCOSAL

## 2016-08-23 MED ORDER — MAGNESIUM SULFATE 4 GM/100ML IV SOLN: 4.0000 g | Freq: Once | INTRAVENOUS | Status: DC

## 2016-08-23 MED ORDER — SODIUM CHLORIDE 0.9% FLUSH
3.0000 mL | INTRAVENOUS | Status: DC | PRN
  Administered 2016-10-06: 3 mL via INTRAVENOUS
  Filled 2016-08-23: qty 3

## 2016-08-23 MED ORDER — SODIUM CHLORIDE 0.9 % IV SOLN
INTRAVENOUS | Status: DC | PRN
  Administered 2016-08-23 (×2): via INTRAVENOUS

## 2016-08-23 MED ORDER — ASPIRIN EC 325 MG PO TBEC: 325.0000 mg | DELAYED_RELEASE_TABLET | Freq: Every day | ORAL | Status: DC

## 2016-08-23 MED ORDER — HEPARIN SODIUM (PORCINE) 1000 UNIT/ML IJ SOLN
INTRAMUSCULAR | Status: DC | PRN
  Administered 2016-08-23: 23000 [IU] via INTRAVENOUS

## 2016-08-23 MED ORDER — FAMOTIDINE IN NACL 20-0.9 MG/50ML-% IV SOLN
20.0000 mg | Freq: Two times a day (BID) | INTRAVENOUS | Status: DC
  Administered 2016-08-23: 20 mg via INTRAVENOUS
  Filled 2016-08-23: qty 50

## 2016-08-23 MED ORDER — ROCURONIUM BROMIDE 100 MG/10ML IV SOLN
INTRAVENOUS | Status: DC | PRN
  Administered 2016-08-23: 50 mg via INTRAVENOUS
  Administered 2016-08-23: 30 mg via INTRAVENOUS
  Administered 2016-08-23 (×2): 50 mg via INTRAVENOUS

## 2016-08-23 MED ORDER — MUPIROCIN 2 % EX OINT
TOPICAL_OINTMENT | Freq: Two times a day (BID) | CUTANEOUS | Status: DC
  Administered 2016-08-23: 04:00:00 via NASAL
  Filled 2016-08-23: qty 22

## 2016-08-23 MED ORDER — BISACODYL 10 MG RE SUPP
10.0000 mg | Freq: Every day | RECTAL | Status: DC
  Administered 2016-08-24 – 2016-08-26 (×3): 10 mg via RECTAL
  Filled 2016-08-23 (×3): qty 1

## 2016-08-23 MED ORDER — METOPROLOL TARTRATE 12.5 MG HALF TABLET: 12.5000 mg | ORAL_TABLET | Freq: Two times a day (BID) | ORAL | Status: DC

## 2016-08-23 MED ORDER — SODIUM CHLORIDE 0.9 % IV SOLN
Freq: Once | INTRAVENOUS | Status: AC
  Administered 2016-08-23: 19:00:00 via INTRAVENOUS

## 2016-08-23 MED ORDER — MILRINONE LACTATE IN DEXTROSE 20-5 MG/100ML-% IV SOLN
0.1250 ug/kg/min | INTRAVENOUS | Status: DC
  Filled 2016-08-23: qty 100

## 2016-08-23 MED ORDER — PHENYLEPHRINE HCL 10 MG/ML IJ SOLN
0.0000 ug/min | INTRAVENOUS | Status: DC
  Administered 2016-08-23: 60 ug/min via INTRAVENOUS
  Administered 2016-08-24: 40 ug/min via INTRAVENOUS
  Administered 2016-08-24: 60 ug/min via INTRAVENOUS
  Filled 2016-08-23 (×4): qty 4

## 2016-08-23 MED ORDER — METOPROLOL TARTRATE 5 MG/5ML IV SOLN: 2.5000 mg | INTRAVENOUS | Status: DC | PRN

## 2016-08-23 MED ORDER — DEXTROSE 5 % IV SOLN
0.0000 ug/min | INTRAVENOUS | Status: DC
  Filled 2016-08-23: qty 4

## 2016-08-23 MED ORDER — DEXMEDETOMIDINE HCL IN NACL 200 MCG/50ML IV SOLN
0.0000 ug/kg/h | INTRAVENOUS | Status: DC
  Administered 2016-08-23 – 2016-08-24 (×3): 1.2 ug/kg/h via INTRAVENOUS
  Filled 2016-08-23 (×2): qty 50

## 2016-08-23 MED ORDER — METOPROLOL TARTRATE 25 MG/10 ML ORAL SUSPENSION
12.5000 mg | Freq: Two times a day (BID) | ORAL | Status: DC
  Filled 2016-08-23: qty 5

## 2016-08-23 MED ORDER — LACTATED RINGERS IV SOLN: INTRAVENOUS | Status: DC

## 2016-08-23 MED ORDER — AMINOCAPROIC ACID 250 MG/ML IV SOLN
0.5000 g/h | Freq: Once | INTRAVENOUS | Status: DC
  Filled 2016-08-23: qty 20

## 2016-08-23 MED ORDER — MILRINONE LACTATE IN DEXTROSE 20-5 MG/100ML-% IV SOLN
0.3000 ug/kg/min | INTRAVENOUS | Status: DC
  Administered 2016-08-24 – 2016-08-25 (×3): 0.25 ug/kg/min via INTRAVENOUS
  Administered 2016-08-26: 0.3 ug/kg/min via INTRAVENOUS
  Administered 2016-08-27 – 2016-08-28 (×2): 0.25 ug/kg/min via INTRAVENOUS
  Administered 2016-08-28 – 2016-08-29 (×2): 0.3 ug/kg/min via INTRAVENOUS
  Administered 2016-08-30 – 2016-09-07 (×11): 0.25 ug/kg/min via INTRAVENOUS
  Administered 2016-09-08: 0.3 ug/kg/min via INTRAVENOUS
  Filled 2016-08-23 (×23): qty 100

## 2016-08-23 MED ORDER — ALBUMIN HUMAN 5 % IV SOLN
250.0000 mL | INTRAVENOUS | Status: AC | PRN
  Administered 2016-08-24: 250 mL via INTRAVENOUS

## 2016-08-23 MED ORDER — PROTAMINE SULFATE 10 MG/ML IV SOLN
INTRAVENOUS | Status: DC | PRN
  Administered 2016-08-23: 200 mg via INTRAVENOUS
  Administered 2016-08-23: 25 mg via INTRAVENOUS

## 2016-08-23 MED ORDER — MIDAZOLAM HCL 2 MG/2ML IJ SOLN
2.0000 mg | INTRAMUSCULAR | Status: DC | PRN
  Administered 2016-08-23 – 2016-08-24 (×4): 2 mg via INTRAVENOUS
  Filled 2016-08-23 (×3): qty 2

## 2016-08-23 MED ORDER — PHENYLEPHRINE HCL 10 MG/ML IJ SOLN
0.0000 ug/min | INTRAVENOUS | Status: DC
  Filled 2016-08-23: qty 2

## 2016-08-23 MED ORDER — BISACODYL 5 MG PO TBEC: 10.0000 mg | DELAYED_RELEASE_TABLET | Freq: Every day | ORAL | Status: DC

## 2016-08-23 MED ORDER — SODIUM CHLORIDE 0.9 % IV SOLN
250.0000 mL | INTRAVENOUS | Status: DC
  Administered 2016-10-02: 13:00:00 via INTRAVENOUS

## 2016-08-23 MED ORDER — SODIUM CHLORIDE 0.9% FLUSH
3.0000 mL | Freq: Two times a day (BID) | INTRAVENOUS | Status: DC
  Administered 2016-08-24 – 2016-08-31 (×13): 3 mL via INTRAVENOUS
  Administered 2016-08-31 – 2016-09-01 (×2): 10 mL via INTRAVENOUS
  Administered 2016-09-01: 3 mL via INTRAVENOUS
  Administered 2016-09-02: 10 mL via INTRAVENOUS
  Administered 2016-09-02 – 2016-09-10 (×7): 3 mL via INTRAVENOUS

## 2016-08-23 MED ORDER — LACTATED RINGERS IV SOLN: 500.0000 mL | Freq: Once | INTRAVENOUS | Status: DC | PRN

## 2016-08-23 MED ORDER — DOCUSATE SODIUM 100 MG PO CAPS: 200.0000 mg | ORAL_CAPSULE | Freq: Every day | ORAL | Status: DC

## 2016-08-23 MED ORDER — DEXMEDETOMIDINE HCL IN NACL 200 MCG/50ML IV SOLN
0.4000 ug/kg/h | INTRAVENOUS | Status: DC
  Administered 2016-08-23: 0.7 ug/kg/h via INTRAVENOUS
  Filled 2016-08-23: qty 50

## 2016-08-23 MED ORDER — ACETAMINOPHEN 160 MG/5ML PO SOLN
1000.0000 mg | Freq: Four times a day (QID) | ORAL | Status: DC
  Administered 2016-08-24 – 2016-08-25 (×3): 1000 mg
  Filled 2016-08-23 (×3): qty 40.6

## 2016-08-23 MED ORDER — 0.9 % SODIUM CHLORIDE (POUR BTL) OPTIME
TOPICAL | Status: DC | PRN
  Administered 2016-08-23: 5000 mL
  Administered 2016-08-23: 1000 mL

## 2016-08-23 MED ORDER — EPINEPHRINE HCL 1 MG/ML IJ SOLN
3.0000 ug/min | INTRAVENOUS | Status: DC
  Administered 2016-08-24 – 2016-08-28 (×6): 3 ug/min via INTRAVENOUS
  Filled 2016-08-23 (×8): qty 4

## 2016-08-23 MED ORDER — SODIUM CHLORIDE 0.9 % IJ SOLN
OROMUCOSAL | Status: DC | PRN
  Administered 2016-08-23: 12 mL via TOPICAL

## 2016-08-23 MED ORDER — ASPIRIN 81 MG PO CHEW
324.0000 mg | CHEWABLE_TABLET | Freq: Every day | ORAL | Status: DC
  Administered 2016-08-24 – 2016-08-29 (×6): 324 mg
  Filled 2016-08-23 (×6): qty 4

## 2016-08-23 MED ORDER — ACETAMINOPHEN 160 MG/5ML PO SOLN: 650.0000 mg | Freq: Once | ORAL | Status: AC

## 2016-08-23 MED ORDER — EPINEPHRINE HCL 1 MG/ML IJ SOLN: 3.0000 ug/min | INTRAVENOUS | Status: DC

## 2016-08-23 MED ORDER — PROPOFOL 10 MG/ML IV BOLUS
INTRAVENOUS | Status: AC
  Filled 2016-08-23: qty 20

## 2016-08-23 MED ORDER — SODIUM CHLORIDE 0.9 % IV SOLN
20.0000 ug | INTRAVENOUS | Status: DC
  Filled 2016-08-23: qty 5

## 2016-08-23 MED ORDER — CALCIUM CHLORIDE 10 % IV SOLN
1.0000 g | Freq: Once | INTRAVENOUS | Status: AC
  Administered 2016-08-23: 1 g via INTRAVENOUS

## 2016-08-23 MED ORDER — SODIUM CHLORIDE 0.9 % IV SOLN
INTRAVENOUS | Status: DC
  Filled 2016-08-23 (×2): qty 2.5

## 2016-08-23 MED ORDER — POTASSIUM CHLORIDE 10 MEQ/50ML IV SOLN: 10.0000 meq | INTRAVENOUS | Status: AC

## 2016-08-23 MED ORDER — INSULIN REGULAR BOLUS VIA INFUSION
0.0000 [IU] | Freq: Three times a day (TID) | INTRAVENOUS | Status: DC
  Filled 2016-08-23: qty 10

## 2016-08-23 MED ORDER — NOREPINEPHRINE BITARTRATE 1 MG/ML IV SOLN
0.0000 ug/min | INTRAVENOUS | Status: DC
  Administered 2016-08-23: 10 ug/min via INTRAVENOUS
  Administered 2016-08-24: 20 ug/min via INTRAVENOUS
  Administered 2016-08-25: 24 ug/min via INTRAVENOUS
  Administered 2016-08-25: 30 ug/min via INTRAVENOUS
  Administered 2016-08-25: 34 ug/min via INTRAVENOUS
  Administered 2016-08-26: 32 ug/min via INTRAVENOUS
  Administered 2016-08-26: 27 ug/min via INTRAVENOUS
  Administered 2016-08-27 (×2): 30 ug/min via INTRAVENOUS
  Administered 2016-08-28: 20 ug/min via INTRAVENOUS
  Administered 2016-08-28: 10 ug/min via INTRAVENOUS
  Filled 2016-08-23 (×12): qty 16

## 2016-08-23 MED ORDER — ATORVASTATIN CALCIUM 20 MG PO TABS
20.0000 mg | ORAL_TABLET | Freq: Every day | ORAL | Status: DC
  Administered 2016-08-24 – 2016-08-25 (×2): 20 mg via ORAL
  Filled 2016-08-23 (×2): qty 1

## 2016-08-23 MED ORDER — ACETAMINOPHEN 500 MG PO TABS: 1000.0000 mg | ORAL_TABLET | Freq: Four times a day (QID) | ORAL | Status: DC

## 2016-08-23 MED ORDER — MIDAZOLAM HCL 10 MG/2ML IJ SOLN
INTRAMUSCULAR | Status: AC
  Filled 2016-08-23: qty 2

## 2016-08-23 MED ORDER — METOCLOPRAMIDE HCL 5 MG/ML IJ SOLN
10.0000 mg | Freq: Four times a day (QID) | INTRAMUSCULAR | Status: DC
  Administered 2016-08-23 – 2016-08-26 (×13): 10 mg via INTRAVENOUS
  Filled 2016-08-23 (×13): qty 2

## 2016-08-23 MED ORDER — SODIUM CHLORIDE 0.45 % IV SOLN
INTRAVENOUS | Status: DC | PRN
  Administered 2016-08-23: 16:00:00 via INTRAVENOUS
  Administered 2016-08-26: 10 mL/h via INTRAVENOUS

## 2016-08-23 MED ORDER — MORPHINE SULFATE (PF) 2 MG/ML IV SOLN
2.0000 mg | INTRAVENOUS | Status: DC | PRN
  Administered 2016-08-24 (×2): 2 mg via INTRAVENOUS
  Filled 2016-08-23: qty 1
  Filled 2016-08-23: qty 2
  Filled 2016-08-23: qty 1

## 2016-08-23 MED ORDER — PLASMA-LYTE 148 IV SOLN
INTRAVENOUS | Status: DC | PRN
  Administered 2016-08-23: 500 mL

## 2016-08-23 MED ORDER — SODIUM CHLORIDE 0.9 % IV SOLN
INTRAVENOUS | Status: DC | PRN
  Administered 2016-08-23: 09:00:00 via INTRAVENOUS

## 2016-08-23 MED ORDER — SODIUM CHLORIDE 0.9 % IV SOLN
INTRAVENOUS | Status: DC
  Administered 2016-08-23: 16:00:00 via INTRAVENOUS
  Administered 2016-08-26: 10 mL/h via INTRAVENOUS
  Administered 2016-08-28: 20 mL/h via INTRAVENOUS
  Administered 2016-08-30: 06:00:00 via INTRAVENOUS
  Administered 2016-09-03: 20 mL/h via INTRAVENOUS

## 2016-08-23 MED ORDER — THROMBIN 5000 UNITS EX SOLR
CUTANEOUS | Status: AC
  Filled 2016-08-23: qty 5000

## 2016-08-23 MED ORDER — HEMOSTATIC AGENTS (NO CHARGE) OPTIME
TOPICAL | Status: DC | PRN
  Administered 2016-08-23: 2 via TOPICAL

## 2016-08-23 MED ORDER — MORPHINE SULFATE (PF) 2 MG/ML IV SOLN
1.0000 mg | INTRAVENOUS | Status: AC | PRN
  Administered 2016-08-23 (×2): 4 mg via INTRAVENOUS
  Filled 2016-08-23: qty 2

## 2016-08-23 MED ORDER — TRAMADOL HCL 50 MG PO TABS: 50.0000 mg | ORAL_TABLET | ORAL | Status: DC | PRN

## 2016-08-23 MED ORDER — PANTOPRAZOLE SODIUM 40 MG PO TBEC: 40.0000 mg | DELAYED_RELEASE_TABLET | Freq: Every day | ORAL | Status: DC

## 2016-08-23 MED ORDER — HEMOSTATIC AGENTS (NO CHARGE) OPTIME
TOPICAL | Status: DC | PRN
  Administered 2016-08-23: 1 via TOPICAL

## 2016-08-23 MED ORDER — ONDANSETRON HCL 4 MG/2ML IJ SOLN
4.0000 mg | Freq: Four times a day (QID) | INTRAMUSCULAR | Status: DC | PRN
  Administered 2016-09-12 – 2016-10-10 (×6): 4 mg via INTRAVENOUS
  Filled 2016-08-23 (×6): qty 2

## 2016-08-23 MED ORDER — EPINEPHRINE HCL 1 MG/ML IJ SOLN
INTRAVENOUS | Status: DC | PRN
  Administered 2016-08-23: 3 ug/min via INTRAVENOUS

## 2016-08-23 MED ORDER — ACETAMINOPHEN 650 MG RE SUPP
650.0000 mg | Freq: Once | RECTAL | Status: AC
  Administered 2016-08-23: 650 mg via RECTAL

## 2016-08-23 MED ORDER — FENTANYL CITRATE (PF) 250 MCG/5ML IJ SOLN
INTRAMUSCULAR | Status: AC
  Filled 2016-08-23: qty 20

## 2016-08-23 MED ORDER — MIDAZOLAM HCL 5 MG/5ML IJ SOLN
INTRAMUSCULAR | Status: DC | PRN
  Administered 2016-08-23 (×2): 3 mg via INTRAVENOUS

## 2016-08-23 MED ORDER — MILRINONE LACTATE IN DEXTROSE 20-5 MG/100ML-% IV SOLN
INTRAVENOUS | Status: DC | PRN
  Administered 2016-08-23: .3 mg/kg/min via INTRAVENOUS

## 2016-08-23 MED ORDER — SODIUM CHLORIDE 0.9 % IV SOLN
INTRAVENOUS | Status: DC | PRN
  Administered 2016-08-23: 20 ug via INTRAVENOUS

## 2016-08-23 MED ORDER — NITROGLYCERIN IN D5W 200-5 MCG/ML-% IV SOLN
0.0000 ug/min | INTRAVENOUS | Status: DC
Start: 2016-08-23 — End: 2016-08-26

## 2016-08-23 MED FILL — Heparin Sodium (Porcine) Inj 1000 Unit/ML: INTRAMUSCULAR | Qty: 10 | Status: AC

## 2016-08-23 MED FILL — Potassium Chloride Inj 2 mEq/ML: INTRAVENOUS | Qty: 40 | Status: AC

## 2016-08-23 MED FILL — Sodium Bicarbonate IV Soln 8.4%: INTRAVENOUS | Qty: 50 | Status: AC

## 2016-08-23 MED FILL — Lidocaine HCl IV Inj 20 MG/ML: INTRAVENOUS | Qty: 5 | Status: AC

## 2016-08-23 MED FILL — Magnesium Sulfate Inj 50%: INTRAMUSCULAR | Qty: 10 | Status: AC

## 2016-08-23 MED FILL — Heparin Sodium (Porcine) Inj 1000 Unit/ML: INTRAMUSCULAR | Qty: 30 | Status: AC

## 2016-08-23 MED FILL — Sodium Chloride IV Soln 0.9%: INTRAVENOUS | Qty: 3000 | Status: AC

## 2016-08-23 MED FILL — Electrolyte-R (PH 7.4) Solution: INTRAVENOUS | Qty: 6000 | Status: AC

## 2016-08-23 SURGICAL SUPPLY — 122 items
ADAPTER CARDIO PERF ANTE/RETRO (ADAPTER) ×10 IMPLANT
APPLICATOR COTTON TIP 6IN STRL (MISCELLANEOUS) IMPLANT
BAG DECANTER FOR FLEXI CONT (MISCELLANEOUS) ×10 IMPLANT
BANDAGE ACE 4X5 VEL STRL LF (GAUZE/BANDAGES/DRESSINGS) ×5 IMPLANT
BANDAGE ACE 6X5 VEL STRL LF (GAUZE/BANDAGES/DRESSINGS) ×5 IMPLANT
BASKET HEART  (ORDER IN 25'S) (MISCELLANEOUS) ×1
BASKET HEART (ORDER IN 25'S) (MISCELLANEOUS) ×1
BASKET HEART (ORDER IN 25S) (MISCELLANEOUS) ×3 IMPLANT
BLADE STERNUM SYSTEM 6 (BLADE) ×10 IMPLANT
BLADE SURG 12 STRL SS (BLADE) ×10 IMPLANT
BLADE SURG 15 STRL LF DISP TIS (BLADE) ×3 IMPLANT
BLADE SURG 15 STRL SS (BLADE) ×2
BLADE SURG ROTATE 9660 (MISCELLANEOUS) IMPLANT
BNDG GAUZE ELAST 4 BULKY (GAUZE/BANDAGES/DRESSINGS) ×5 IMPLANT
BOOT SUTURE AID YELLOW STND (SUTURE) IMPLANT
CANISTER SUCTION 2500CC (MISCELLANEOUS) ×10 IMPLANT
CANN PRFSN 3/8XCNCT ST RT ANG (MISCELLANEOUS)
CANN PRFSN 3/8XRT ANG TPR 14 (MISCELLANEOUS) ×3
CANNULA GUNDRY RCSP 15FR (MISCELLANEOUS) ×10 IMPLANT
CANNULA PRFSN 3/8XCNCT RT ANG (MISCELLANEOUS) IMPLANT
CANNULA PRFSN 3/8XRT ANG TPR14 (MISCELLANEOUS) ×3 IMPLANT
CANNULA VEN MTL TIP RT (MISCELLANEOUS) ×2
CANNULA VRC MALB SNGL STG 28FR (MISCELLANEOUS) ×3 IMPLANT
CATH CPB KIT VANTRIGT (MISCELLANEOUS) ×5 IMPLANT
CATH ROBINSON RED A/P 18FR (CATHETERS) ×30 IMPLANT
CATH THORACIC 36FR RT ANG (CATHETERS) ×10 IMPLANT
CONN 1/2X1/2X1/2  BEN (MISCELLANEOUS) ×2
CONN 1/2X1/2X1/2 BEN (MISCELLANEOUS) ×3 IMPLANT
CONN 3/8X1/2 ST GISH (MISCELLANEOUS) ×10 IMPLANT
COVER SURGICAL LIGHT HANDLE (MISCELLANEOUS) ×5 IMPLANT
CRADLE DONUT ADULT HEAD (MISCELLANEOUS) ×10 IMPLANT
DERMABOND ADVANCED (GAUZE/BANDAGES/DRESSINGS) ×4
DERMABOND ADVANCED .7 DNX12 (GAUZE/BANDAGES/DRESSINGS) ×6 IMPLANT
DRAIN CHANNEL 28F RND 3/8 FF (WOUND CARE) ×5 IMPLANT
DRAIN CHANNEL 32F RND 10.7 FF (WOUND CARE) ×5 IMPLANT
DRAPE CARDIOVASCULAR INCISE (DRAPES) ×4
DRAPE SLUSH/WARMER DISC (DRAPES) ×10 IMPLANT
DRAPE SRG 135X102X78XABS (DRAPES) ×6 IMPLANT
DRSG AQUACEL AG ADV 3.5X14 (GAUZE/BANDAGES/DRESSINGS) ×5 IMPLANT
ELECT BLADE 4.0 EZ CLEAN MEGAD (MISCELLANEOUS) ×5
ELECT BLADE 6.5 EXT (BLADE) ×10 IMPLANT
ELECT CAUTERY BLADE 6.4 (BLADE) ×10 IMPLANT
ELECT REM PT RETURN 9FT ADLT (ELECTROSURGICAL) ×20
ELECTRODE BLDE 4.0 EZ CLN MEGD (MISCELLANEOUS) ×3 IMPLANT
ELECTRODE REM PT RTRN 9FT ADLT (ELECTROSURGICAL) ×12 IMPLANT
FELT TEFLON 1X6 (MISCELLANEOUS) ×20 IMPLANT
GAUZE SPONGE 4X4 12PLY STRL (GAUZE/BANDAGES/DRESSINGS) ×20 IMPLANT
GLOVE BIO SURGEON STRL SZ 6 (GLOVE) IMPLANT
GLOVE BIO SURGEON STRL SZ 6.5 (GLOVE) ×4 IMPLANT
GLOVE BIO SURGEON STRL SZ7 (GLOVE) ×10 IMPLANT
GLOVE BIO SURGEON STRL SZ7.5 (GLOVE) ×25 IMPLANT
GLOVE BIO SURGEONS STRL SZ 6.5 (GLOVE) ×1
GLOVE SS N UNI LF 6.0 STRL (GLOVE) ×5 IMPLANT
GOWN STRL REUS W/ TWL LRG LVL3 (GOWN DISPOSABLE) ×18 IMPLANT
GOWN STRL REUS W/TWL LRG LVL3 (GOWN DISPOSABLE) ×12
HEMOSTAT POWDER SURGIFOAM 1G (HEMOSTASIS) ×30 IMPLANT
HEMOSTAT SURGICEL 2X14 (HEMOSTASIS) ×10 IMPLANT
INSERT FOGARTY XLG (MISCELLANEOUS) ×5 IMPLANT
KIT BASIN OR (CUSTOM PROCEDURE TRAY) ×10 IMPLANT
KIT ROOM TURNOVER OR (KITS) ×10 IMPLANT
KIT SUCTION CATH 14FR (SUCTIONS) ×10 IMPLANT
KIT VASOVIEW HEMOPRO VH 3000 (KITS) ×5 IMPLANT
LEAD PACING MYOCARDI (MISCELLANEOUS) ×5 IMPLANT
LINE VENT (MISCELLANEOUS) ×5 IMPLANT
MARKER GRAFT CORONARY BYPASS (MISCELLANEOUS) ×5 IMPLANT
NS IRRIG 1000ML POUR BTL (IV SOLUTION) ×65 IMPLANT
PACK OPEN HEART (CUSTOM PROCEDURE TRAY) ×10 IMPLANT
PAD ARMBOARD 7.5X6 YLW CONV (MISCELLANEOUS) ×20 IMPLANT
PAD ELECT DEFIB RADIOL ZOLL (MISCELLANEOUS) ×5 IMPLANT
PENCIL BUTTON HOLSTER BLD 10FT (ELECTRODE) ×5 IMPLANT
PUNCH AORTIC ROTATE 4.0MM (MISCELLANEOUS) IMPLANT
PUNCH AORTIC ROTATE 4.5MM 8IN (MISCELLANEOUS) IMPLANT
PUNCH AORTIC ROTATE 5MM 8IN (MISCELLANEOUS) IMPLANT
SET CARDIOPLEGIA MPS 5001102 (MISCELLANEOUS) ×5 IMPLANT
SOLUTION ANTI FOG 6CC (MISCELLANEOUS) ×5 IMPLANT
SPONGE GAUZE 4X4 12PLY STER LF (GAUZE/BANDAGES/DRESSINGS) ×15 IMPLANT
SPONGE LAP 18X18 X RAY DECT (DISPOSABLE) ×10 IMPLANT
SUCKER WEIGHTED FLEX (MISCELLANEOUS) ×5 IMPLANT
SURGIFLO W/THROMBIN 8M KIT (HEMOSTASIS) ×5 IMPLANT
SUT BONE WAX W31G (SUTURE) ×10 IMPLANT
SUT ETHIBOND 2 0 SH (SUTURE) ×16 IMPLANT
SUT ETHIBOND 2 0 SH 36X2 (SUTURE) ×9 IMPLANT
SUT ETHIBOND 2 0 V4 (SUTURE) IMPLANT
SUT ETHIBOND 2 0V4 GREEN (SUTURE) IMPLANT
SUT ETHIBOND 4 0 TF (SUTURE) IMPLANT
SUT ETHIBOND 5 0 C 1 30 (SUTURE) IMPLANT
SUT MNCRL AB 4-0 PS2 18 (SUTURE) ×5 IMPLANT
SUT PROLENE 3 0 RB 1 (SUTURE) ×5 IMPLANT
SUT PROLENE 3 0 SH 1 (SUTURE) ×5 IMPLANT
SUT PROLENE 3 0 SH DA (SUTURE) ×5 IMPLANT
SUT PROLENE 3 0 SH1 36 (SUTURE) IMPLANT
SUT PROLENE 4 0 RB 1 (SUTURE) ×12
SUT PROLENE 4 0 SH DA (SUTURE) ×30 IMPLANT
SUT PROLENE 4-0 RB1 .5 CRCL 36 (SUTURE) ×18 IMPLANT
SUT PROLENE 5 0 C 1 36 (SUTURE) IMPLANT
SUT PROLENE 6 0 C 1 30 (SUTURE) ×25 IMPLANT
SUT PROLENE 6 0 CC (SUTURE) IMPLANT
SUT PROLENE 8 0 BV175 6 (SUTURE) IMPLANT
SUT PROLENE BLUE 7 0 (SUTURE) ×5 IMPLANT
SUT SILK  1 MH (SUTURE) ×8
SUT SILK 1 MH (SUTURE) ×12 IMPLANT
SUT SILK 2 0 SH CR/8 (SUTURE) ×20 IMPLANT
SUT SILK 3 0 SH CR/8 (SUTURE) IMPLANT
SUT STEEL 6MS V (SUTURE) ×20 IMPLANT
SUT STEEL SZ 6 DBL 3X14 BALL (SUTURE) ×10 IMPLANT
SUT VIC AB 1 CTX 36 (SUTURE) ×10
SUT VIC AB 1 CTX36XBRD ANBCTR (SUTURE) ×15 IMPLANT
SUT VIC AB 2-0 CT1 27 (SUTURE) ×2
SUT VIC AB 2-0 CT1 TAPERPNT 27 (SUTURE) ×3 IMPLANT
SUT VIC AB 2-0 CTX 27 (SUTURE) ×10 IMPLANT
SUT VIC AB 3-0 X1 27 (SUTURE) IMPLANT
SUTURE E-PAK OPEN HEART (SUTURE) ×10 IMPLANT
SYSTEM SAHARA CHEST DRAIN ATS (WOUND CARE) ×10 IMPLANT
TAPE CLOTH SURG 4X10 WHT LF (GAUZE/BANDAGES/DRESSINGS) ×10 IMPLANT
TAPE PAPER 3X10 WHT MICROPORE (GAUZE/BANDAGES/DRESSINGS) ×5 IMPLANT
TOWEL OR 17X24 6PK STRL BLUE (TOWEL DISPOSABLE) ×20 IMPLANT
TOWEL OR 17X26 10 PK STRL BLUE (TOWEL DISPOSABLE) ×20 IMPLANT
TUBING INSUFFLATION (TUBING) ×5 IMPLANT
UNDERPAD 30X30 (UNDERPADS AND DIAPERS) ×10 IMPLANT
VALVE MAGNA MITRAL 25MM (Prosthesis & Implant Heart) ×5 IMPLANT
VRC MALLEABLE SINGLE STG 28FR (MISCELLANEOUS) ×5
WATER STERILE IRR 1000ML POUR (IV SOLUTION) ×20 IMPLANT

## 2016-08-23 NOTE — Anesthesia Procedure Notes (Signed)
Central Venous Catheter Insertion Performed by: anesthesiologist 08/23/2016 9:15 AM Patient location: OR. Preanesthetic checklist: patient identified, IV checked, risks and benefits discussed, surgical consent, monitors and equipment checked, pre-op evaluation, timeout performed and anesthesia consent Position: supine Landmarks identified and Seldinger technique used Catheter size: 8.5 Fr PA cath was placed.Sheath introducer Swan type and PA catheter depth:thermodilutionProcedure performed using ultrasound guided technique. Attempts: 1 Following insertion, line sutured. Post procedure assessment: blood return through all ports, free fluid flow and no air. Patient tolerated the procedure well with no immediate complications. Additional procedure comments: PA catheter:  Routine monitors. Timeout, sterile prep, drape, FBP R neck.  supine position, finder and trocar RIJ 1st pass with US guidance.  Cordis placed over J wire. PA catheter in easily.  Sterile dressing applied.  Patient tolerated well, VSS.  Jenita Seashore, MD.

## 2016-08-23 NOTE — Progress Notes (Signed)
The patient was examined and preop studies reviewed. There has been no change from the prior exam and the patient is ready for surgery.  plan MVR and possible CABG on D Haughn

## 2016-08-23 NOTE — Anesthesia Procedure Notes (Signed)
Procedures

## 2016-08-23 NOTE — Progress Notes (Signed)
Fentanyl gtt 27mL wasted in sink due to gtt expiring at 0800, and witnessed x2 RN (H. Dabid Godown and Rudi Heap).

## 2016-08-23 NOTE — Transfer of Care (Signed)
Immediate Anesthesia Transfer of Care Note  Patient: Deborah [name]  Procedure(s) Performed: Procedure(s): MITRAL VALVE REPAIR (MVR) USING 25MM EDWARDS MAGNA EASE BIOPROSTHESIS MITRAL  VALVE (N/A) INTRAOPERATIVE TRANSESOPHAGEAL ECHOCARDIOGRAM (N/A) ENDOVEIN HARVEST OF GREATER SAPHENOUS VEIN (Left) CENTRAL LINE INSERTION RIGHT SUBCLAVIAN (Right)  Patient Location: SICU  Anesthesia Type:General  Level of Consciousness: Patient remains intubated per anesthesia plan  Airway & Oxygen Therapy: Patient remains intubated per anesthesia plan and Patient placed on Ventilator (see vital sign flow sheet for setting)  Post-op Assessment: Report given to RN  Post vital signs: Reviewed and stable  Last Vitals:  Vitals:   08/23/16 0800 08/23/16 0810  BP:    Pulse: 66 65  Resp: 16 16  Temp: 37.5 C 37.5 C    Last Pain:  Vitals:   08/23/16 0800  TempSrc: Core (Comment)         Complications: No apparent anesthesia complications

## 2016-08-23 NOTE — Brief Op Note (Signed)
08/18/2016 - 08/23/2016  1:12 PM  PATIENT:  Deborah [name]  60 y.o. female  PRE-OPERATIVE DIAGNOSIS:  MR, CAD  POST-OPERATIVE DIAGNOSIS:  * No post-op diagnosis entered *  PROCEDURE:  Procedure(s): MITRAL VALVE REPAIR (MVR) (N/A) CORONARY ARTERY BYPASS GRAFTING (CABG) (N/A) INTRAOPERATIVE TRANSESOPHAGEAL ECHOCARDIOGRAM (N/A)  SURGEON:  Surgeon(s) and Role:    * Ivin Poot, MD - Primary  PHYSICIAN ASSISTANT:  Nicholes Rough, PA-C  ANESTHESIA:   general  EBL:  Total I/O In: 846.1 [I.V.:846.1] Out: -   BLOOD ADMINISTERED:none  DRAINS: ROUTINE   LOCAL MEDICATIONS USED:  Amount: NONE  SPECIMEN:  Source of Specimen:  posterior papillary muscle. mitral leaflet.  DISPOSITION OF SPECIMEN:  PATHOLOGY  COUNTS:  YES  TOURNIQUET:  * No tourniquets in log *  DICTATION: .Other Dictation: Dictation Number pending  PLAN OF CARE: Admit to inpatient   PATIENT DISPOSITION:  ICU - intubated and hemodynamically stable.

## 2016-08-23 NOTE — Anesthesia Procedure Notes (Signed)
Performed by: Tressia Miners LEFFEW

## 2016-08-23 NOTE — Progress Notes (Signed)
[facility] Brief Note  Pt back from CABG/MVR Last K 4.6 Oligatory fluid intake about 100 cc/hour with current drips/pressors Dr. Darcey Nora has indicated to nursing does not want CRRT restarted just yet.  Will leave contingency orders if he wishes to restart tonight (with all 4K fluids, no heparin and keep volume even) Orders are in Signed and Held section, RN to release.   Jamal Maes, MD [facility] [phone] Pager 08/23/2016, 4:18 PM

## 2016-08-23 NOTE — Progress Notes (Signed)
PULMONARY / CRITICAL CARE MEDICINE   Name: Deborah [name] MRN: [id number] DOB: 1956/08/20    ADMISSION DATE:  08/18/2016 CONSULTATION DATE:  08/18/2016  REFERRING MD:  Bensimhon   CHIEF COMPLAINT:  Ventilator management and critical care services   HISTORY OF PRESENT ILLNESS:   60 yo female admitted to [facility] 08/15/16 with STEMI s/p cath to [place].  Developed cardiogenic shock, VDRF (ETT 9/14), AKI with metabolic acidosis and started on CRRT 08/17/16.  Transferred to [facility] to tx shock and evaluated new severe MR from papillary muscle rupture.  She also had fever and found to have Group B Strep in sputum culture.  SUBJECTIVE:  Remains on IABP.  Back on low dose pressors.  VITAL SIGNS: BP (!) 92/51   Pulse 74   Temp 97.9 F (36.6 C)   Resp 16   Ht 5\' 1"  (1.549 m) Comment: from 08/15/16 encounter  Wt 171 lb 15.3 oz (78 kg)   SpO2 100%   BMI 32.49 kg/m   HEMODYNAMICS: PAP: (25-49)/(17-31) 40/23 CVP:  [8 mmHg-17 mmHg] 14 mmHg PCWP:  [18 mmHg-22 mmHg] 22 mmHg CO:  [5 L/min-5.1 L/min] 5.1 L/min CI:  [2.9 L/min/m2-3 L/min/m2] 3 L/min/m2  VENTILATOR SETTINGS: Vent Mode: (P) PRVC FiO2 (%):  [40 %] (P) 40 % Set Rate:  [16 bmp] (P) 16 bmp Vt Set:  [450 mL] (P) 450 mL PEEP:  [5 cmH20-10 cmH20] 5 cmH20 Plateau Pressure:  [12 cmH20-18 cmH20] 18 cmH20  INTAKE / OUTPUT: I/O last 3 completed shifts: In: 3925 [I.V.:1656.7; Other:90; NG/GT:1625; IV Piggyback:553.3] Out: 6637 [Urine:50; Other:6587]  PHYSICAL EXAMINATION: General: sedated Neuro: RASS -4 HEENT: ETT in place Cardiac: regular, 4/6 SM Chest: faint basilar crackles Abd: soft, non tender Ext: 1+ edema Skin: no rashes  LABS:  BMET  Recent Labs Lab 08/22/16 0315 08/22/16 1514 08/22/16 1731 08/23/16 0328  NA 137 136 136 135  135  K 4.3 4.6 4.2 4.4  4.6  CL 106 102 103 100*  102  CO2 22  --  22 24  23   BUN 49* 36* 40* 32*  32*  CREATININE 3.11* 2.60* 2.88* 2.33*  2.31*  GLUCOSE 221* 252* 234* 186*  184*     Electrolytes  Recent Labs Lab 08/21/16 0436  08/22/16 0315 08/22/16 1731 08/23/16 0328  CALCIUM 7.4*  < > 7.2* 7.9* 7.7*  7.8*  MG 1.9  --  1.9  --  2.1  PHOS  --   < > 2.3* 2.9 3.5  < > = values in this interval not displayed.  CBC  Recent Labs Lab 08/22/16 0315 08/22/16 1514 08/22/16 1731 08/23/16 0328  WBC 15.6*  --  19.4* 22.5*  HGB 8.7* 9.2* 9.5* 9.3*  HCT 27.0* 27.0* 29.8* 28.4*  PLT 262  --  327 351    Coag's No results for input(s): APTT, INR in the last 168 hours.  Sepsis Markers  Recent Labs Lab 08/19/16 1451 08/19/16 1706 08/20/16 0500 08/21/16 0436  LATICACIDVEN 1.4  --  1.3  --   PROCALCITON  --  5.44 5.24 4.50    ABG  Recent Labs Lab 08/21/16 0430 08/22/16 0340 08/23/16 0403  PHART 7.260* 7.312* 7.337*  PCO2ART 43.4 47.5 46.8  PO2ART 69.5* 101 93.5    Liver Enzymes  Recent Labs Lab 08/21/16 0436  08/22/16 0315 08/22/16 1731 08/23/16 0328  AST 40  --  38  --  55*  ALT 26  --  29  --  37  ALKPHOS 74  --  79  --  113  BILITOT 0.5  --  0.5  --  0.5  ALBUMIN 1.5*  < > 1.4* 1.7* 1.6*  1.6*  < > = values in this interval not displayed.  Cardiac Enzymes  Recent Labs Lab 08/16/16 1737 08/16/16 2308 08/17/16 0431  TROPONINI 51.74* 38.24* 26.79*    Glucose  Recent Labs Lab 08/22/16 0725 08/22/16 1200 08/22/16 1657 08/22/16 1940 08/22/16 2358 08/23/16 0313  GLUCAP 178* 188* 251* 244* 234* 166*    Imaging Dg Chest Port 1 View  Result Date: 08/22/2016 CLINICAL DATA:  Heart failure.  Balloon pump advanced. EXAM: PORTABLE CHEST 1 VIEW COMPARISON:  08/22/2016 at 4:50 a.m. FINDINGS: Metallic tip of the balloon pump projects over the aortic knob, well positioned. Endotracheal tube, right and left internal jugular central venous lines, femoral a placed Swan-Ganz catheter and nasal/orogastric tube are stable in well positioned. Lung aeration appears mildly improved although the difference may be technical only. There  still vascular congestion and interstitial and hazy airspace opacity consistent with asymmetric pulmonary edema. Small pleural effusions are noted. IMPRESSION: 1. Intra-aortic balloon pump tip projects over the aortic knob, well positioned. 2. Possible mild improvement in lung aeration since the earlier study although the difference may be technical only. No other evidence of a change. Electronically Signed   By: Lajean Manes M.D.   On: 08/22/2016 16:34   Dg Abd Portable 1v  Result Date: 08/22/2016 CLINICAL DATA:  Balloon pump position EXAM: PORTABLE ABDOMEN - 1 VIEW COMPARISON:  Chest x-ray 08/22/2016.  KUB 08/17/2016 FINDINGS: Right femoral Swan-Ganz catheter extends above the diaphragm . Intra-aortic balloon pump marker appears overlying the L3 vertebral body in previously was in the thoracic aorta. Left femoral venous catheter in the left iliac vein. NG tube in the stomach. Normal bowel gas pattern. IMPRESSION: Intra-aortic balloon pump marker  overlies the L3 vertebral body. Electronically Signed   By: Franchot Gallo M.D.   On: 08/22/2016 13:12    STUDIES:  LHC 9/12 >> PCI to OM1, severe MR, EF 55 to 60% RHC 9/15 >> RA 5, RV 37/3/6, PA 38/18/26, PCWP 15, CI 1.9, PVR 3.3 WU TEE 9/15 >> EF 60 to 65%, flail motion MV with severe MR  CULTURES: Blood 9/15 >> Sputum 9/15 >> Group B Strep  ANTIBIOTICS: Zosyn 9/16 >> 9/17 Vancomycin 9/16 >> 9/17 Rocephin 9/17 >>  SIGNIFICANT EVENTS: 9/12 Admit to [facility], PCI to [facility] 9/14 ETT, CRRT 9/15 to [facility] 9/16 off insulin gtt 9/20 To OR   LINES/TUBES: Right IJ PICC 9/12 >> ETT 9/14 >>  HD cath left IJ 9/14 >> Rt femoral introducer 9/15 >> IABP 9/15 >> Lt femoral a line 9/15 >>   ASSESSMENT / PLAN:  PULMONARY A: Acute hypoxic respiratory failure in setting of pulmonary edema. P:   Full vent support F/u CXR, ABG  CARDIOVASCULAR A:  Inferior/posterior MI. Severe MR w/ ruptured posterior papillary muscle and subsequent Cardiogenic  shock. Septic shock. P:  IABP, PA catheter per cardiology Plan is for MVR and possible CABG 9/20 Continue heparin, tirofiban per cardiology  RENAL A:   AKI >> baseline creatinine 1.6-1.7. P:   Renal replacement per nephrology F/u BMET, ABG  GASTROINTESTINAL A:   Critical illness related malnutrition. P:   Protonix for SUP NPO for surgery  HEMATOLOGIC A:   Anemia of critical illness. P:  F/u CBC  INFECTIOUS A:   Group B Strep PNA. P:   Day 5 of Abx, Day 3 of rocephin  ENDOCRINE A:   DM. P:   SSI with lantus  NEUROLOGIC A:   Acute metabolic encephalopathy. P:   RASS goal: -1  CC time 31 minutes.  Will d/w TCTS if PCCM needs to continue following after surgery.  Chesley Mires, MD [facility] Pulmonary/Critical Care 08/23/2016, 7:45 AM Pager:  [phone] After 3pm call: [phone]

## 2016-08-23 NOTE — Progress Notes (Signed)
  Echocardiogram Echocardiogram Transesophageal has been performed.  Deborah [name] 08/23/2016, 11:36 AM

## 2016-08-23 NOTE — Progress Notes (Signed)
Patient ID: Deborah [name], female   DOB: 26-Jan-1956, 60 y.o.   MRN: [id number] EVENING ROUNDS NOTE :     [address]       [place] [address]             [phone]                 Day of Surgery Procedure(s) (LRB): MITRAL VALVE REPAIR (MVR) USING 25MM EDWARDS MAGNA EASE BIOPROSTHESIS MITRAL  VALVE (N/A) INTRAOPERATIVE TRANSESOPHAGEAL ECHOCARDIOGRAM (N/A) ENDOVEIN HARVEST OF GREATER SAPHENOUS VEIN (Left) CENTRAL LINE INSERTION RIGHT SUBCLAVIAN (Right)  Total Length of Stay:  LOS: 5 days  BP 102/71   Pulse 89   Temp (!) 95.5 F (35.3 C)   Resp (!) 32   Ht 5\' 1"  (1.549 m) Comment: from 08/15/16 encounter  Wt 171 lb 15.3 oz (78 kg)   SpO2 99%   BMI 32.49 kg/m   .Intake/Output      09/20 0701 - 09/21 0700   I.V. (mL/kg) 1852.1 (23.7)   Blood 1644   Other    NG/GT    IV Piggyback    Total Intake(mL/kg) 3496.1 (44.8)   Urine (mL/kg/hr) 22 (0)   Other    Blood 600 (0.6)   Chest Tube 100 (0.1)   Total Output 722   Net +2774.1         . sodium chloride 10 mL/hr at 08/23/16 1600  . [START ON 08/24/2016] sodium chloride    . sodium chloride 20 mL/hr at 08/23/16 1600  . amiodarone 30 mg/hr (08/23/16 1600)  . dexmedetomidine 0.7 mcg/kg/hr (08/23/16 1600)  . epinephrine 3 mcg/min (08/23/16 1600)  . fentaNYL infusion INTRAVENOUS 100 mcg/hr (08/23/16 0752)  . insulin (NOVOLIN-R) infusion 4.6 Units/hr (08/23/16 1600)  . lactated ringers    . lactated ringers    . milrinone 0.25 mg/kg/min (08/23/16 1600)  . nitroGLYCERIN    . norepinephrine (LEVOPHED) Adult infusion 10 mcg/min (08/23/16 1842)  . phenylephrine (NEO-SYNEPHRINE) Adult infusion 40 mcg/min (08/23/16 1842)     Lab Results  Component Value Date   WBC 21.8 (H) 08/23/2016   HGB 9.2 (L) 08/23/2016   HCT 27.0 (L) 08/23/2016   PLT 194 08/23/2016   GLUCOSE 151 (H) 08/23/2016   CHOL 207 (H) 07/09/2014   TRIG 174.0 (H) 07/09/2014   HDL 40.60 07/09/2014   LDLCALC 132 (H) 07/09/2014   ALT 37  08/23/2016   AST 55 (H) 08/23/2016   NA 140 08/23/2016   K 4.0 08/23/2016   CL 107 08/23/2016   CREATININE 2.30 (H) 08/23/2016   BUN 34 (H) 08/23/2016   CO2 24 08/23/2016   CO2 23 08/23/2016   TSH 1.485 Test methodology is 3rd generation TSH 01/26/2010   INR 1.50 08/23/2016   HGBA1C 13.3 (H) 08/15/2016   MICROALBUR 15.8 (H) 07/09/2014   Sedated on vent,  On pressors and IAB Not wean able from vent    Grace Isaac MD  Beeper [phone] Office [phone] 08/23/2016 7:15 PM

## 2016-08-23 NOTE — Progress Notes (Signed)
[facility] Progress Note    Subjective:    CRRT started 9/18 Pulled 50-75 net neg/hour with neg fluid balance last 24 hours - stopped now for surgery Minimal UOP Back on norepi To OR this AM for MVR and poss CABG    Objective:   BP (!) 92/51   Pulse 74   Temp 97.9 F (36.6 C)   Resp 16   Ht 5' 1"  (1.549 m) Comment: from 08/15/16 encounter  Wt 78 kg (171 lb 15.3 oz)   SpO2 100%   BMI 32.49 kg/m   Intake/Output Summary (Last 24 hours) at 08/23/16 0829 Last data filed at 08/23/16 0700  Gross per 24 hour  Intake          2808.19 ml  Output             4405 ml  Net         -1596.81 ml   Weight trending  08/23/16  78 kg CRRT stopped for OR  08/22/16  79.3  kg   08/21/16 0500  78.7 kg (173 lb  CRRT initiated  08/20/16 0500  77.9 kg (171 lb    08/19/16 0500  72.5 kg (159 lb 1   08/18/16 1709  71.2 kg (156 lb 1   08/18/16 1457  72.9 kg (160 lb 1     Physical Exam: Intubated, sedated, on IABP 1:1 BP (!) 92/51   Pulse 74   Temp 97.9 F (36.6 C)   Resp 16   Ht 5' 1"  (1.549 m) Comment: from 08/15/16 encounter  Wt 78 kg (171 lb 15.3 oz)   SpO2 100%   BMI 32.49 kg/m   PAP 40/23 CVP 14 CI 3 VS as noted R triple lumen/SGC, left HD cath (placed 9/14 at [facility]), left fem art line, R fem IAPB Regular tachycardia, 3/6 pansystolic murmur at apex Lungs ant clear Abd: soft, obese, nontender Trace-1+ edema bilat LE's and UE's  Dressings on heels   Imaging: Dg Chest Port 1 View  Result Date: 08/23/2016 CLINICAL DATA:  Respiratory failure. Acute on chronic diastolic heart failure. Cardiogenic shock. EXAM: PORTABLE CHEST 1 VIEW COMPARISON:  08/22/2016, 08/21/2016 and 08/20/2016 and 08/15/2016 FINDINGS: Endotracheal tube, NG tube and right and left jugular vein central catheters appear in good position, unchanged. Intra aortic balloon pump in place. Heart size and pulmonary vascularity are now normal. Small bilateral pleural effusions. Slight haziness at the  lung bases is most likely secondary to the effusions although I think there is an element of slight residual pulmonary edema as well. IMPRESSION: Improving pulmonary edema.  Persistent small bilateral effusions. Electronically Signed   By: Lorriane Shire M.D.   On: 08/23/2016 07:44   Dg Chest Port 1 View  Result Date: 08/22/2016 CLINICAL DATA:  Heart failure.  Balloon pump advanced. EXAM: PORTABLE CHEST 1 VIEW COMPARISON:  08/22/2016 at 4:50 a.m. FINDINGS: Metallic tip of the balloon pump projects over the aortic knob, well positioned. Endotracheal tube, right and left internal jugular central venous lines, femoral a placed Swan-Ganz catheter and nasal/orogastric tube are stable in well positioned. Lung aeration appears mildly improved although the difference may be technical only. There still vascular congestion and interstitial and hazy airspace opacity consistent with asymmetric pulmonary edema. Small pleural effusions are noted. IMPRESSION: 1. Intra-aortic balloon pump tip projects over the aortic knob, well positioned. 2. Possible mild improvement in lung aeration since the earlier study although the difference may be technical only. No other evidence of a change.  Electronically Signed   By: Lajean Manes M.D.   On: 08/22/2016 16:34   Dg Chest Port 1 View  Result Date: 08/22/2016 CLINICAL DATA:  Respiratory failure, shortness of Breath EXAM: PORTABLE CHEST 1 VIEW COMPARISON:  08/21/2016 FINDINGS: Support devices are stable. Cardiomegaly. Bilateral perihilar and lower lobe opacities throughout the lungs have increased slightly since prior study, likely mild edema. Suspect small layering effusions. IMPRESSION: Mild interval worsening of pulmonary edema/ CHF. Small layering effusions. Electronically Signed   By: Rolm Baptise M.D.   On: 08/22/2016 07:30   Dg Abd Portable 1v  Result Date: 08/22/2016 CLINICAL DATA:  Balloon pump position EXAM: PORTABLE ABDOMEN - 1 VIEW COMPARISON:  Chest x-ray 08/22/2016.   KUB 08/17/2016 FINDINGS: Right femoral Swan-Ganz catheter extends above the diaphragm . Intra-aortic balloon pump marker appears overlying the L3 vertebral body in previously was in the thoracic aorta. Left femoral venous catheter in the left iliac vein. NG tube in the stomach. Normal bowel gas pattern. IMPRESSION: Intra-aortic balloon pump marker  overlies the L3 vertebral body. Electronically Signed   By: Franchot Gallo M.D.   On: 08/22/2016 13:12    Labs: BMET  Recent Labs Lab 08/17/16 1601 08/18/16 0518  08/18/16 1500 08/19/16 0410 08/20/16 0500 08/21/16 0436 08/21/16 1600 08/22/16 0315 08/22/16 1514 08/22/16 1731 08/23/16 0328  NA  --  137  136  < > 135 137 136 135 135 137 136 136 135  135  K  --  4.3  4.3  < > 4.7 4.1 4.1 5.0 4.6 4.3 4.6 4.2 4.4  4.6  CL  --  106  105  < > 105 106 106 105 105 106 102 103 100*  102  CO2  --  27  26  < > 20* 23 22 20* 23 22  --  22 24  23   GLUCOSE  --  203*  200*  < > 355* 163* 161* 272* 263* 221* 252* 234* 186*  184*  BUN  --  34*  32*  < > 39* 43* 58* 69* 64* 49* 36* 40* 32*  32*  CREATININE  --  2.29*  2.31*  < > 2.77* 3.12* 3.94* 4.96* 4.26* 3.11* 2.60* 2.88* 2.33*  2.31*  CALCIUM  --  8.4*  8.2*  < > 8.1* 7.7* 7.3* 7.4* 7.3* 7.2*  --  7.9* 7.7*  7.8*  PHOS 4.2 2.2*  2.1*  --  2.7  --   --   --  2.9 2.3*  --  2.9 3.5  < > = values in this interval not displayed. CBC  Recent Labs Lab 08/20/16 0500  08/21/16 0436 08/21/16 1600 08/22/16 0315 08/22/16 1514 08/22/16 1731 08/23/16 0328  WBC 12.6*  < > 14.2* 14.1* 15.6*  --  19.4* 22.5*  NEUTROABS 9.5*  --  10.4*  --  11.3*  --   --  15.6*  HGB 7.5*  < > 8.2* 8.6* 8.7* 9.2* 9.5* 9.3*  HCT 22.9*  < > 25.9* 26.7* 27.0* 27.0* 29.8* 28.4*  MCV 85.4  < > 87.8 87.3 86.5  --  87.9 87.7  PLT 230  < > 235 249 262  --  327 351  < > = values in this interval not displayed.  Medications:    . aminocaproic acid (AMICAR) for OHS   Intravenous To OR  . aspirin  81 mg Oral Daily   . atorvastatin  80 mg Oral Daily  . cefTRIAXone (ROCEPHIN)  IV  2 g Intravenous  Q24H  . cefUROXime (ZINACEF)  IV  1.5 g Intravenous To OR  . cefUROXime (ZINACEF)  IV  750 mg Intravenous To OR  . chlorhexidine gluconate (MEDLINE KIT)  15 mL Mouth Rinse BID  . Chlorhexidine Gluconate Cloth  6 each Topical Once  . dexmedetomidine  0.1-0.7 mcg/kg/hr Intravenous To OR  . DOPamine  0-10 mcg/kg/min Intravenous To OR  . epinephrine  0-10 mcg/min Intravenous To OR  . heparin-papaverine-plasmalyte irrigation   Irrigation To OR  . heparin 30,000 units/NS 1000 mL solution for CELLSAVER   Other To OR  . insulin aspart  0-9 Units Subcutaneous Q4H  . insulin glargine  17 Units Subcutaneous QHS  . insulin (NOVOLIN-R) infusion   Intravenous To OR  . magnesium sulfate  40 mEq Other To OR  . mouth rinse  15 mL Mouth Rinse 10 times per day  . milrinone  0.125 mcg/kg/min Intravenous To OR  . mupirocin ointment   Nasal BID  . nitroGLYCERIN  2-200 mcg/min Intravenous To OR  . norepinephrine (LEVOPHED) Adult infusion  0-40 mcg/min Intravenous To OR  . pantoprazole sodium  40 mg Per Tube Q24H  . phenylephrine (NEO-SYNEPHRINE) Adult infusion  30-200 mcg/min Intravenous To OR  . potassium chloride  80 mEq Other To OR  . sodium phosphate  Dextrose 5% IVPB  10 mmol Intravenous Q12H  . vancomycin  1,250 mg Intravenous To OR   Infusions:  . sodium chloride 10 mL/hr at 08/21/16 1250  . amiodarone 30 mg/hr (08/23/16 0449)  . feeding supplement (VITAL AF 1.2 CAL) Stopped (08/23/16 0000)  . fentaNYL infusion INTRAVENOUS 100 mcg/hr (08/23/16 0752)  . heparin Stopped (08/23/16 0630)  . norepinephrine (LEVOPHED) Adult infusion 10 mcg/min (08/23/16 0659)  . dialysis replacement fluid (prismasate) 300 mL/hr at 08/22/16 2036  . dialysis replacement fluid (prismasate) 200 mL/hr at 08/22/16 1221  . dialysate (PRISMASATE) 1,000 mL/hr at 08/23/16 0301   Background:  60 y/o woman with DM2, HTN, HL and noncompliance  presented to [facility] ER with infrerior posterior STEMI on 9/12->cath->. PCS with stent x 2 by Dr. Clayborn Bigness. EF 55-60% with severe MR confirmed by echo.  Post-cath developed respiratory failure and required intubation. Developed shock and renal failure (creatinine 1.6 on adm, prior to that only creatinine available was 1.1 2015). Trialysis catheter placed and pt had CRRT at [facility] for 48 hours prior to transfer here on 08/18/16->cath lab -> found to have ruptured posterior papillary muscle with severe MR->IABP/Swan. We were asked to see. D/T progressive rise in creatinine and volume excess with plan for mitral valve surgery, CRRT re-initiated 08/21/16.   Assessment/ Plan:    Acute renal failure on CKD III:  Creatinine 1.6 on admission to [facility] (prior to that only creatinine 1.1 07/2014) AKI from cardiogenic shock with evolution to ATN and contrast nephropathy after emergent coronary angiogram/intervention at [facility] and rec'd CRRT 48 hours at [facility] prior to transfer Now essentially anuric Rec'd CRRT 9/18-9/20 in prep for OR Will reassess and touch base with Dr. Prescott Gum post op as to when to resume  Cardiogenic shock status post acute inferopost STEMI/posterior papillary muscle/Severe MR: S/P PCI with 2 stents to OM1 branch [facility]) On pressors and IABP For MVR and possible CABG today  Anemia S/p PRBC's X 1 9/17.  VDRF Secondary to cardiogenic shock, continue ventilator management per CCM.  Group B streptococci + sputum cultures V/Z changed to rocephin   Jamal Maes, MD [place] Pager 08/23/2016, 8:29 AM   .

## 2016-08-24 ENCOUNTER — Inpatient Hospital Stay (HOSPITAL_COMMUNITY): Payer: Medicaid Other

## 2016-08-24 ENCOUNTER — Encounter (HOSPITAL_COMMUNITY): Payer: Self-pay | Admitting: Cardiothoracic Surgery

## 2016-08-24 LAB — CBC
HCT: 26 % — ABNORMAL LOW (ref 36.0–46.0)
HEMATOCRIT: 21.8 % — AB (ref 36.0–46.0)
HEMOGLOBIN: 8.7 g/dL — AB (ref 12.0–15.0)
Hemoglobin: 7.4 g/dL — ABNORMAL LOW (ref 12.0–15.0)
MCH: 29.2 pg (ref 26.0–34.0)
MCH: 29.1 pg (ref 26.0–34.0)
MCHC: 33.5 g/dL (ref 30.0–36.0)
MCHC: 33.9 g/dL (ref 30.0–36.0)
MCV: 87.2 fL (ref 78.0–100.0)
MCV: 85.8 fL (ref 78.0–100.0)
PLATELETS: 173 10*3/uL (ref 150–400)
Platelets: 184 10*3/uL (ref 150–400)
RBC: 2.98 MIL/uL — ABNORMAL LOW (ref 3.87–5.11)
RBC: 2.54 MIL/uL — ABNORMAL LOW (ref 3.87–5.11)
RDW: 14.6 % (ref 11.5–15.5)
RDW: 15 % (ref 11.5–15.5)
WBC: 21.4 10*3/uL — ABNORMAL HIGH (ref 4.0–10.5)
WBC: 26.7 10*3/uL — ABNORMAL HIGH (ref 4.0–10.5)

## 2016-08-24 LAB — PREPARE FRESH FROZEN PLASMA
Unit division: 0
Unit division: 0

## 2016-08-24 LAB — POCT I-STAT, CHEM 8
BUN: 32 mg/dL — ABNORMAL HIGH (ref 6–20)
CALCIUM ION: 1.12 mmol/L — AB (ref 1.15–1.40)
CREATININE: 3.3 mg/dL — AB (ref 0.44–1.00)
Chloride: 103 mmol/L (ref 101–111)
Glucose, Bld: 131 mg/dL — ABNORMAL HIGH (ref 65–99)
HEMATOCRIT: 22 % — AB (ref 36.0–46.0)
HEMOGLOBIN: 7.5 g/dL — AB (ref 12.0–15.0)
Potassium: 4.5 mmol/L (ref 3.5–5.1)
SODIUM: 134 mmol/L — AB (ref 135–145)
TCO2: 20 mmol/L (ref 0–100)

## 2016-08-24 LAB — POCT I-STAT 3, ART BLOOD GAS (G3+)
ACID-BASE DEFICIT: 5 mmol/L — AB (ref 0.0–2.0)
Acid-base deficit: 5 mmol/L — ABNORMAL HIGH (ref 0.0–2.0)
Acid-base deficit: 6 mmol/L — ABNORMAL HIGH (ref 0.0–2.0)
BICARBONATE: 19.4 mmol/L — AB (ref 20.0–28.0)
Bicarbonate: 20.2 mmol/L (ref 20.0–28.0)
Bicarbonate: 20 mmol/L (ref 20.0–28.0)
O2 SAT: 90 %
O2 Saturation: 93 %
O2 Saturation: 97 %
PCO2 ART: 34.6 mmHg (ref 32.0–48.0)
PH ART: 7.374 (ref 7.350–7.450)
PH ART: 7.343 — AB (ref 7.350–7.450)
PO2 ART: 59 mmHg — AB (ref 83.0–108.0)
PO2 ART: 89 mmHg (ref 83.0–108.0)
Patient temperature: 36.7
TCO2: 21 mmol/L (ref 0–100)
TCO2: 21 mmol/L (ref 0–100)
TCO2: 21 mmol/L (ref 0–100)
pCO2 arterial: 36.4 mmHg (ref 32.0–48.0)
pCO2 arterial: 34.3 mmHg (ref 32.0–48.0)
pH, Arterial: 7.352 (ref 7.350–7.450)
pO2, Arterial: 65 mmHg — ABNORMAL LOW (ref 83.0–108.0)

## 2016-08-24 LAB — RENAL FUNCTION PANEL
ALBUMIN: 1.5 g/dL — AB (ref 3.5–5.0)
ALBUMIN: 2.1 g/dL — AB (ref 3.5–5.0)
ANION GAP: 10 (ref 5–15)
Anion gap: 13 (ref 5–15)
BUN: 36 mg/dL — AB (ref 6–20)
BUN: 35 mg/dL — AB (ref 6–20)
CALCIUM: 8 mg/dL — AB (ref 8.9–10.3)
CO2: 20 mmol/L — AB (ref 22–32)
CO2: 18 mmol/L — ABNORMAL LOW (ref 22–32)
CREATININE: 3.11 mg/dL — AB (ref 0.44–1.00)
Calcium: 7.9 mg/dL — ABNORMAL LOW (ref 8.9–10.3)
Chloride: 106 mmol/L (ref 101–111)
Chloride: 103 mmol/L (ref 101–111)
Creatinine, Ser: 3.01 mg/dL — ABNORMAL HIGH (ref 0.44–1.00)
GFR calc Af Amer: 18 mL/min — ABNORMAL LOW (ref >60)
GFR calc Af Amer: 18 mL/min — ABNORMAL LOW (ref >60)
GFR calc non Af Amer: 16 mL/min — ABNORMAL LOW (ref >60)
GFR, EST NON AFRICAN AMERICAN: 15 mL/min — AB (ref >60)
GLUCOSE: 108 mg/dL — AB (ref 65–99)
Glucose, Bld: 136 mg/dL — ABNORMAL HIGH (ref 65–99)
PHOSPHORUS: 6.1 mg/dL — AB (ref 2.5–4.6)
PHOSPHORUS: 6.5 mg/dL — AB (ref 2.5–4.6)
Potassium: 4.4 mmol/L (ref 3.5–5.1)
Potassium: 4.6 mmol/L (ref 3.5–5.1)
SODIUM: 136 mmol/L (ref 135–145)
SODIUM: 134 mmol/L — AB (ref 135–145)

## 2016-08-24 LAB — GLUCOSE, CAPILLARY
GLUCOSE-CAPILLARY: 115 mg/dL — AB (ref 65–99)
GLUCOSE-CAPILLARY: 119 mg/dL — AB (ref 65–99)
GLUCOSE-CAPILLARY: 101 mg/dL — AB (ref 65–99)
GLUCOSE-CAPILLARY: 109 mg/dL — AB (ref 65–99)
GLUCOSE-CAPILLARY: 107 mg/dL — AB (ref 65–99)
GLUCOSE-CAPILLARY: 139 mg/dL — AB (ref 65–99)
Glucose-Capillary: 101 mg/dL — ABNORMAL HIGH (ref 65–99)
Glucose-Capillary: 102 mg/dL — ABNORMAL HIGH (ref 65–99)
Glucose-Capillary: 104 mg/dL — ABNORMAL HIGH (ref 65–99)
Glucose-Capillary: 121 mg/dL — ABNORMAL HIGH (ref 65–99)
Glucose-Capillary: 130 mg/dL — ABNORMAL HIGH (ref 65–99)
Glucose-Capillary: 142 mg/dL — ABNORMAL HIGH (ref 65–99)
Glucose-Capillary: 181 mg/dL — ABNORMAL HIGH (ref 65–99)
Glucose-Capillary: 97 mg/dL (ref 65–99)

## 2016-08-24 LAB — BLOOD GAS, ARTERIAL
Acid-base deficit: 5.7 mmol/L — ABNORMAL HIGH (ref 0.0–2.0)
Bicarbonate: 19.3 mmol/L — ABNORMAL LOW (ref 20.0–28.0)
FIO2: 80
MECHVT: 400 mL
O2 Saturation: 95.4 %
PEEP: 5 cmH2O
Patient temperature: 98.6
Pressure support: 10 cmH2O
RATE: 16 resp/min
pCO2 arterial: 38.6 mmHg (ref 32.0–48.0)
pH, Arterial: 7.321 — ABNORMAL LOW (ref 7.350–7.450)
pO2, Arterial: 79.9 mmHg — ABNORMAL LOW (ref 83.0–108.0)

## 2016-08-24 LAB — PREPARE PLATELET PHERESIS: Unit division: 0

## 2016-08-24 LAB — PREPARE CRYOPRECIPITATE
Unit division: 0
Unit division: 0

## 2016-08-24 LAB — MAGNESIUM: MAGNESIUM: 2.2 mg/dL (ref 1.7–2.4)

## 2016-08-24 MED ORDER — DEXMEDETOMIDINE HCL IN NACL 400 MCG/100ML IV SOLN
0.4000 ug/kg/h | INTRAVENOUS | Status: DC
  Administered 2016-08-24 (×3): 1.2 ug/kg/h via INTRAVENOUS
  Administered 2016-08-25 (×5): 1 ug/kg/h via INTRAVENOUS
  Administered 2016-08-26 (×2): 0.7 ug/kg/h via INTRAVENOUS
  Administered 2016-08-26: 1 ug/kg/h via INTRAVENOUS
  Administered 2016-08-27 (×2): 0.7 ug/kg/h via INTRAVENOUS
  Administered 2016-08-27: 0.4 ug/kg/h via INTRAVENOUS
  Administered 2016-08-28 (×2): 0.7 ug/kg/h via INTRAVENOUS
  Administered 2016-08-28: 0.5 ug/kg/h via INTRAVENOUS
  Administered 2016-08-29: 0.7 ug/kg/h via INTRAVENOUS
  Administered 2016-08-29: 0.6 ug/kg/h via INTRAVENOUS
  Administered 2016-08-30: 0.7 ug/kg/h via INTRAVENOUS
  Administered 2016-08-30: 0.5 ug/kg/h via INTRAVENOUS
  Administered 2016-08-31 (×2): 0.7 ug/kg/h via INTRAVENOUS
  Administered 2016-08-31 – 2016-09-01 (×3): 1 ug/kg/h via INTRAVENOUS
  Filled 2016-08-24 (×32): qty 100

## 2016-08-24 MED ORDER — FENTANYL CITRATE (PF) 100 MCG/2ML IJ SOLN
25.0000 ug | INTRAMUSCULAR | Status: DC | PRN
  Administered 2016-08-24 (×2): 100 ug via INTRAVENOUS
  Administered 2016-08-25: 50 ug via INTRAVENOUS
  Administered 2016-08-25 – 2016-08-26 (×3): 100 ug via INTRAVENOUS
  Administered 2016-08-26: 50 ug via INTRAVENOUS
  Administered 2016-08-27 – 2016-08-28 (×14): 100 ug via INTRAVENOUS
  Administered 2016-08-29: 25 ug via INTRAVENOUS
  Administered 2016-08-29 (×3): 100 ug via INTRAVENOUS
  Administered 2016-08-29: 75 ug via INTRAVENOUS
  Administered 2016-08-30: 50 ug via INTRAVENOUS
  Administered 2016-08-30: 100 ug via INTRAVENOUS
  Administered 2016-08-30 (×2): 50 ug via INTRAVENOUS
  Filled 2016-08-24 (×29): qty 2

## 2016-08-24 MED ORDER — INSULIN DETEMIR 100 UNIT/ML ~~LOC~~ SOLN
10.0000 [IU] | Freq: Two times a day (BID) | SUBCUTANEOUS | Status: DC
  Administered 2016-08-24 – 2016-09-25 (×59): 10 [IU] via SUBCUTANEOUS
  Filled 2016-08-24 (×74): qty 0.1

## 2016-08-24 MED ORDER — SODIUM BICARBONATE 8.4 % IV SOLN
50.0000 meq | Freq: Once | INTRAVENOUS | Status: AC
  Administered 2016-08-24: 50 meq via INTRAVENOUS

## 2016-08-24 MED ORDER — PRISMASOL BGK 4/2.5 32-4-2.5 MEQ/L IV SOLN
INTRAVENOUS | Status: DC
  Administered 2016-08-24 – 2016-09-10 (×73): via INTRAVENOUS_CENTRAL
  Filled 2016-08-24 (×85): qty 5000

## 2016-08-24 MED ORDER — PRISMASOL BGK 4/2.5 32-4-2.5 MEQ/L IV SOLN
INTRAVENOUS | Status: DC
  Administered 2016-08-24 – 2016-09-11 (×17): via INTRAVENOUS_CENTRAL
  Filled 2016-08-24 (×23): qty 5000

## 2016-08-24 MED ORDER — ALBUMIN HUMAN 25 % IV SOLN
12.5000 g | Freq: Four times a day (QID) | INTRAVENOUS | Status: AC
  Administered 2016-08-24 – 2016-08-25 (×4): 12.5 g via INTRAVENOUS
  Filled 2016-08-24 (×2): qty 50
  Filled 2016-08-24: qty 100

## 2016-08-24 MED ORDER — MIDAZOLAM HCL 2 MG/2ML IJ SOLN
1.0000 mg | INTRAMUSCULAR | Status: DC | PRN
  Administered 2016-08-24 (×2): 2 mg via INTRAVENOUS
  Administered 2016-08-25: 1 mg via INTRAVENOUS
  Administered 2016-08-25: 2 mg via INTRAVENOUS
  Administered 2016-08-26: 1 mg via INTRAVENOUS
  Administered 2016-08-26 – 2016-08-27 (×3): 2 mg via INTRAVENOUS
  Filled 2016-08-24 (×9): qty 2

## 2016-08-24 MED ORDER — INSULIN ASPART 100 UNIT/ML ~~LOC~~ SOLN
0.0000 [IU] | SUBCUTANEOUS | Status: DC
Start: 2016-08-24 — End: 2016-10-11
  Administered 2016-08-24: 2 [IU] via SUBCUTANEOUS
  Administered 2016-08-25: 4 [IU] via SUBCUTANEOUS
  Administered 2016-08-25: 2 [IU] via SUBCUTANEOUS
  Administered 2016-08-25 (×2): 4 [IU] via SUBCUTANEOUS
  Administered 2016-08-26 (×3): 2 [IU] via SUBCUTANEOUS
  Administered 2016-08-26: 4 [IU] via SUBCUTANEOUS
  Administered 2016-08-26 – 2016-08-27 (×6): 2 [IU] via SUBCUTANEOUS
  Administered 2016-08-27: 4 [IU] via SUBCUTANEOUS
  Administered 2016-08-27: 2 [IU] via SUBCUTANEOUS
  Administered 2016-08-28 (×2): 8 [IU] via SUBCUTANEOUS
  Administered 2016-08-28 (×2): 4 [IU] via SUBCUTANEOUS
  Administered 2016-08-28: 8 [IU] via SUBCUTANEOUS
  Administered 2016-08-28 – 2016-08-29 (×4): 4 [IU] via SUBCUTANEOUS
  Administered 2016-08-29: 2 [IU] via SUBCUTANEOUS
  Administered 2016-08-29: 4 [IU] via SUBCUTANEOUS
  Administered 2016-08-29: 2 [IU] via SUBCUTANEOUS
  Administered 2016-08-29 – 2016-08-30 (×3): 4 [IU] via SUBCUTANEOUS
  Administered 2016-08-30: 8 [IU] via SUBCUTANEOUS
  Administered 2016-08-30 (×2): 4 [IU] via SUBCUTANEOUS
  Administered 2016-08-31: 2 [IU] via SUBCUTANEOUS
  Administered 2016-08-31 (×3): 4 [IU] via SUBCUTANEOUS
  Administered 2016-09-01 (×2): 2 [IU] via SUBCUTANEOUS
  Administered 2016-09-01: 8 [IU] via SUBCUTANEOUS
  Administered 2016-09-01: 2 [IU] via SUBCUTANEOUS
  Administered 2016-09-02: 12 [IU] via SUBCUTANEOUS
  Administered 2016-09-02: 2 [IU] via SUBCUTANEOUS
  Administered 2016-09-02: 4 [IU] via SUBCUTANEOUS
  Administered 2016-09-02 – 2016-09-03 (×3): 2 [IU] via SUBCUTANEOUS
  Administered 2016-09-03: 4 [IU] via SUBCUTANEOUS
  Administered 2016-09-04: 2 [IU] via SUBCUTANEOUS
  Administered 2016-09-04: 4 [IU] via SUBCUTANEOUS
  Administered 2016-09-04: 2 [IU] via SUBCUTANEOUS
  Administered 2016-09-04 (×2): 8 [IU] via SUBCUTANEOUS
  Administered 2016-09-05 (×2): 4 [IU] via SUBCUTANEOUS
  Administered 2016-09-05: 2 [IU] via SUBCUTANEOUS
  Administered 2016-09-05: 8 [IU] via SUBCUTANEOUS
  Administered 2016-09-05: 2 [IU] via SUBCUTANEOUS
  Administered 2016-09-05: 4 [IU] via SUBCUTANEOUS
  Administered 2016-09-06 (×3): 2 [IU] via SUBCUTANEOUS
  Administered 2016-09-06: 4 [IU] via SUBCUTANEOUS
  Administered 2016-09-06: 2 [IU] via SUBCUTANEOUS
  Administered 2016-09-06: 4 [IU] via SUBCUTANEOUS
  Administered 2016-09-07: 8 [IU] via SUBCUTANEOUS
  Administered 2016-09-07: 4 [IU] via SUBCUTANEOUS
  Administered 2016-09-07 – 2016-09-08 (×2): 2 [IU] via SUBCUTANEOUS
  Administered 2016-09-08 (×2): 4 [IU] via SUBCUTANEOUS
  Administered 2016-09-08: 8 [IU] via SUBCUTANEOUS
  Administered 2016-09-09: 2 [IU] via SUBCUTANEOUS
  Administered 2016-09-09: 8 [IU] via SUBCUTANEOUS
  Administered 2016-09-09: 2 [IU] via SUBCUTANEOUS
  Administered 2016-09-09 (×2): 4 [IU] via SUBCUTANEOUS
  Administered 2016-09-10 (×3): 2 [IU] via SUBCUTANEOUS
  Administered 2016-09-10: 4 [IU] via SUBCUTANEOUS
  Administered 2016-09-10 – 2016-09-11 (×2): 2 [IU] via SUBCUTANEOUS
  Administered 2016-09-11: 4 [IU] via SUBCUTANEOUS
  Administered 2016-09-11: 2 [IU] via SUBCUTANEOUS
  Administered 2016-09-11: 4 [IU] via SUBCUTANEOUS
  Administered 2016-09-12: 2 [IU] via SUBCUTANEOUS
  Administered 2016-09-12: 12 [IU] via SUBCUTANEOUS
  Administered 2016-09-12: 8 [IU] via SUBCUTANEOUS
  Administered 2016-09-12: 4 [IU] via SUBCUTANEOUS
  Administered 2016-09-12 – 2016-09-13 (×2): 2 [IU] via SUBCUTANEOUS
  Administered 2016-09-13: 4 [IU] via SUBCUTANEOUS
  Administered 2016-09-13: 2 [IU] via SUBCUTANEOUS
  Administered 2016-09-13: 8 [IU] via SUBCUTANEOUS
  Administered 2016-09-13 – 2016-09-14 (×2): 4 [IU] via SUBCUTANEOUS
  Administered 2016-09-14: 2 [IU] via SUBCUTANEOUS
  Administered 2016-09-14: 4 [IU] via SUBCUTANEOUS
  Administered 2016-09-14: 12 [IU] via SUBCUTANEOUS
  Administered 2016-09-14: 4 [IU] via SUBCUTANEOUS
  Administered 2016-09-14: 12 [IU] via SUBCUTANEOUS
  Administered 2016-09-15 (×2): 4 [IU] via SUBCUTANEOUS
  Administered 2016-09-16 (×2): 8 [IU] via SUBCUTANEOUS
  Administered 2016-09-17: 4 [IU] via SUBCUTANEOUS
  Administered 2016-09-17: 8 [IU] via SUBCUTANEOUS
  Administered 2016-09-17: 4 [IU] via SUBCUTANEOUS
  Administered 2016-09-17: 2 [IU] via SUBCUTANEOUS
  Administered 2016-09-17 – 2016-09-18 (×4): 4 [IU] via SUBCUTANEOUS
  Administered 2016-09-18: 8 [IU] via SUBCUTANEOUS
  Administered 2016-09-18 (×2): 2 [IU] via SUBCUTANEOUS
  Administered 2016-09-19: 4 [IU] via SUBCUTANEOUS
  Administered 2016-09-19: 12 [IU] via SUBCUTANEOUS
  Administered 2016-09-19: 2 [IU] via SUBCUTANEOUS
  Administered 2016-09-19: 4 [IU] via SUBCUTANEOUS
  Administered 2016-09-19: 2 [IU] via SUBCUTANEOUS
  Administered 2016-09-20 (×2): 4 [IU] via SUBCUTANEOUS
  Administered 2016-09-21 (×2): 2 [IU] via SUBCUTANEOUS
  Administered 2016-09-21 – 2016-09-22 (×2): 8 [IU] via SUBCUTANEOUS
  Administered 2016-09-22 – 2016-09-23 (×3): 4 [IU] via SUBCUTANEOUS
  Administered 2016-09-23 (×2): 2 [IU] via SUBCUTANEOUS
  Administered 2016-09-23 (×2): 4 [IU] via SUBCUTANEOUS
  Administered 2016-09-23 – 2016-09-24 (×2): 2 [IU] via SUBCUTANEOUS
  Administered 2016-09-24: 8 [IU] via SUBCUTANEOUS
  Administered 2016-09-24 (×2): 4 [IU] via SUBCUTANEOUS
  Administered 2016-09-24: 2 [IU] via SUBCUTANEOUS
  Administered 2016-09-25 (×2): 8 [IU] via SUBCUTANEOUS
  Administered 2016-09-25: 2 [IU] via SUBCUTANEOUS
  Administered 2016-09-25: 12 [IU] via SUBCUTANEOUS
  Administered 2016-09-26 (×2): 2 [IU] via SUBCUTANEOUS
  Administered 2016-09-26: 4 [IU] via SUBCUTANEOUS
  Administered 2016-09-26: 16 [IU] via SUBCUTANEOUS
  Administered 2016-09-26: 2 [IU] via SUBCUTANEOUS
  Administered 2016-09-27: 12 [IU] via SUBCUTANEOUS
  Administered 2016-09-27: 2 [IU] via SUBCUTANEOUS
  Administered 2016-09-27: 8 [IU] via SUBCUTANEOUS
  Administered 2016-09-27: 2 [IU] via SUBCUTANEOUS
  Administered 2016-09-28 (×2): 8 [IU] via SUBCUTANEOUS
  Administered 2016-09-29 (×2): 4 [IU] via SUBCUTANEOUS
  Administered 2016-09-29: 8 [IU] via SUBCUTANEOUS
  Administered 2016-09-29: 4 [IU] via SUBCUTANEOUS
  Administered 2016-09-29: 8 [IU] via SUBCUTANEOUS
  Administered 2016-09-29: 16 [IU] via SUBCUTANEOUS
  Administered 2016-09-30: 8 [IU] via SUBCUTANEOUS
  Administered 2016-09-30: 12 [IU] via SUBCUTANEOUS
  Administered 2016-09-30 (×2): 8 [IU] via SUBCUTANEOUS
  Administered 2016-09-30 – 2016-10-01 (×3): 4 [IU] via SUBCUTANEOUS
  Administered 2016-10-01 (×2): 8 [IU] via SUBCUTANEOUS
  Administered 2016-10-01: 12 [IU] via SUBCUTANEOUS
  Administered 2016-10-01 (×2): 8 [IU] via SUBCUTANEOUS
  Administered 2016-10-02 (×2): 2 [IU] via SUBCUTANEOUS
  Administered 2016-10-02: 12 [IU] via SUBCUTANEOUS
  Administered 2016-10-02: 2 [IU] via SUBCUTANEOUS
  Administered 2016-10-03: 8 [IU] via SUBCUTANEOUS
  Administered 2016-10-03 (×2): 4 [IU] via SUBCUTANEOUS
  Administered 2016-10-03: 8 [IU] via SUBCUTANEOUS
  Administered 2016-10-04: 2 [IU] via SUBCUTANEOUS
  Administered 2016-10-04: 16 [IU] via SUBCUTANEOUS
  Administered 2016-10-04: 12 [IU] via SUBCUTANEOUS
  Administered 2016-10-04: 16 [IU] via SUBCUTANEOUS
  Administered 2016-10-04: 2 [IU] via SUBCUTANEOUS
  Administered 2016-10-04 – 2016-10-05 (×3): 16 [IU] via SUBCUTANEOUS
  Administered 2016-10-05: 2 [IU] via SUBCUTANEOUS
  Administered 2016-10-06 (×2): 8 [IU] via SUBCUTANEOUS
  Administered 2016-10-06: 20 [IU] via SUBCUTANEOUS
  Administered 2016-10-06: 8 [IU] via SUBCUTANEOUS
  Administered 2016-10-06: 12 [IU] via SUBCUTANEOUS
  Administered 2016-10-06 – 2016-10-07 (×2): 8 [IU] via SUBCUTANEOUS
  Administered 2016-10-07 (×3): 4 [IU] via SUBCUTANEOUS
  Administered 2016-10-07 – 2016-10-08 (×2): 12 [IU] via SUBCUTANEOUS
  Administered 2016-10-08: 8 [IU] via SUBCUTANEOUS
  Administered 2016-10-08: 4 [IU] via SUBCUTANEOUS
  Administered 2016-10-08: 2 [IU] via SUBCUTANEOUS
  Administered 2016-10-08: 8 [IU] via SUBCUTANEOUS
  Administered 2016-10-08: 4 [IU] via SUBCUTANEOUS
  Administered 2016-10-09: 8 [IU] via SUBCUTANEOUS
  Administered 2016-10-09: 4 [IU] via SUBCUTANEOUS
  Administered 2016-10-09: 12 [IU] via SUBCUTANEOUS
  Administered 2016-10-09: 4 [IU] via SUBCUTANEOUS
  Administered 2016-10-10: 12 [IU] via SUBCUTANEOUS
  Administered 2016-10-10: 2 [IU] via SUBCUTANEOUS
  Administered 2016-10-11: 4 [IU] via SUBCUTANEOUS

## 2016-08-24 MED ORDER — LIDOCAINE HCL (CARDIAC) 20 MG/ML IV SOLN
100.0000 mg | INTRAVENOUS | Status: AC
  Administered 2016-08-24: 100 mg via INTRAVENOUS

## 2016-08-24 MED ORDER — FAMOTIDINE IN NACL 20-0.9 MG/50ML-% IV SOLN
20.0000 mg | INTRAVENOUS | Status: DC
  Administered 2016-08-24 – 2016-08-27 (×4): 20 mg via INTRAVENOUS
  Filled 2016-08-24 (×4): qty 50

## 2016-08-24 MED ORDER — PRISMASOL BGK 4/2.5 32-4-2.5 MEQ/L IV SOLN
INTRAVENOUS | Status: DC
Start: 2016-08-24 — End: 2016-09-10
  Administered 2016-08-24 – 2016-09-10 (×37): via INTRAVENOUS_CENTRAL
  Filled 2016-08-24 (×50): qty 5000

## 2016-08-24 MED ORDER — SODIUM CHLORIDE 0.9 % FOR CRRT
INTRAVENOUS_CENTRAL | Status: DC | PRN
  Filled 2016-08-24: qty 1000

## 2016-08-24 MED ORDER — LIDOCAINE IN D5W 4-5 MG/ML-% IV SOLN
1.0000 mg/min | INTRAVENOUS | Status: DC
  Administered 2016-08-24 – 2016-08-25 (×2): 2 mg/min via INTRAVENOUS
  Administered 2016-08-25 – 2016-08-26 (×2): 1 mg/min via INTRAVENOUS
  Administered 2016-08-27: 2 mg/min via INTRAVENOUS
  Administered 2016-08-28: 1 mg/min via INTRAVENOUS
  Filled 2016-08-24 (×6): qty 500

## 2016-08-24 NOTE — Progress Notes (Signed)
PULMONARY / CRITICAL CARE MEDICINE   Name: MAKAILA WINDLE MRN: [id number] DOB: 1956-04-29    ADMISSION DATE:  08/18/2016 CONSULTATION DATE:  08/18/2016  REFERRING MD:  Bensimhon   CHIEF COMPLAINT:  Ventilator management and critical care services   HISTORY OF PRESENT ILLNESS:   60 yo female admitted to [facility] 08/15/16 with STEMI s/p cath to [place].  Developed cardiogenic shock, VDRF (ETT 9/14), AKI with metabolic acidosis and started on CRRT 08/17/16.  Transferred to [facility] to tx shock and evaluated new severe MR from papillary muscle rupture.  She also had fever and found to have Group B Strep in sputum culture.  SUBJECTIVE: Patient underwent bioprosthetic mitral valve replacement yesterday. Patient w/ marginal blood pressure & weaned off Fentanyl gtt yesterday.  REVIEW OF SYSTEMS:  Unable to obtain with patient on ventilator.  VITAL SIGNS: BP (!) 79/54   Pulse 81   Temp 98.4 F (36.9 C)   Resp (!) 22   Ht 5\' 1"  (1.549 m) Comment: from 08/15/16 encounter  Wt 180 lb 12.4 oz (82 kg)   SpO2 96%   BMI 34.16 kg/m   HEMODYNAMICS: PAP: (33-48)/(13-25) 48/21 CVP:  [4 mmHg] 4 mmHg PCWP:  [2.4 mmHg] 2.4 mmHg CO:  [3.7 L/min-4.5 L/min] 3.7 L/min CI:  [2.1 L/min/m2-2.6 L/min/m2] 2.1 L/min/m2  VENTILATOR SETTINGS: Vent Mode: SIMV;PSV FiO2 (%):  [40 %-80 %] 80 % Set Rate:  [12 bmp-16 bmp] 16 bmp Vt Set:  [400 mL-450 mL] 400 mL PEEP:  [5 cmH20] 5 cmH20 Pressure Support:  [10 cmH20] 10 cmH20 Plateau Pressure:  [19 cmH20-20 cmH20] 19 cmH20  INTAKE / OUTPUT: I/O last 3 completed shifts: In: 6664 [I.V.:3980; Blood:1979; NG/GT:305; IV Piggyback:400] Out: 8366 [Urine:36; QHUTM:5465; Blood:600; Chest Tube:210]  PHYSICAL EXAMINATION: General: No distress. Nurse & CVTS at bedside. Neuro: Pupils pinpoint. Sedated. HEENT: ETT in place. No scleral icterus. Moist mucus membranes. Cardiac: Regular rate. Trace edema. IABP in place. Chest: Decreased breath sounds bilaterally. Symmetric chest rise on  vent. Abd: Soft. Protuberant. Hypoactive BS. Integument:  Warm & dry. No rash on exposed skin. Chest tubes & lines in place.  LABS:  BMET  Recent Labs Lab 08/22/16 1731 08/23/16 0328  08/23/16 1353 08/23/16 1557 08/23/16 2153 08/23/16 2157 08/24/16 0434  NA 136 135  135  < > 140 140 136  --  136  K 4.2 4.4  4.6  < > 4.6 4.0 4.4  --  4.4  CL 103 100*  102  < > 107  --  106  --  106  CO2 22 24  23   --   --   --   --   --  20*  BUN 40* 32*  32*  < > 34*  --  32*  --  36*  CREATININE 2.88* 2.33*  2.31*  < > 2.30*  --  2.60* 2.76* 3.01*  GLUCOSE 234* 186*  184*  < > 184* 151* 131*  --  108*  < > = values in this interval not displayed.  Electrolytes  Recent Labs Lab 08/22/16 1731 08/23/16 0328 08/23/16 2157 08/24/16 0433 08/24/16 0434  CALCIUM 7.9* 7.7*  7.8*  --   --  8.0*  MG  --  2.1 2.1 2.2  --   PHOS 2.9 3.5  --   --  6.1*    CBC  Recent Labs Lab 08/23/16 1553  08/23/16 2153 08/23/16 2157 08/24/16 0433  WBC 21.8*  --   --  21.0* 21.4*  HGB 7.9*  < >  8.8* 9.1* 8.7*  HCT 23.8*  < > 26.0* 27.8* 26.0*  PLT 194  --   --  188 184  < > = values in this interval not displayed.  Coag's  Recent Labs Lab 08/23/16 1553  APTT 40*  INR 1.50    Sepsis Markers  Recent Labs Lab 08/19/16 1451 08/19/16 1706 08/20/16 0500 08/21/16 0436  LATICACIDVEN 1.4  --  1.3  --   PROCALCITON  --  5.44 5.24 4.50    ABG  Recent Labs Lab 08/23/16 1608 08/23/16 1943 08/24/16 0350  PHART 7.197* 7.267* 7.321*  PCO2ART 55.6* 47.8 38.6  PO2ART 57.0* 82.0* 79.9*    Liver Enzymes  Recent Labs Lab 08/21/16 0436  08/22/16 0315 08/22/16 1731 08/23/16 0328 08/24/16 0434  AST 40  --  38  --  55*  --   ALT 26  --  29  --  37  --   ALKPHOS 74  --  79  --  113  --   BILITOT 0.5  --  0.5  --  0.5  --   ALBUMIN 1.5*  < > 1.4* 1.7* 1.6*  1.6* 1.5*  < > = values in this interval not displayed.  Cardiac Enzymes No results for input(s): TROPONINI, PROBNP in  the last 168 hours.  Glucose  Recent Labs Lab 08/23/16 2359 08/24/16 0100 08/24/16 0203 08/24/16 0356 08/24/16 0555 08/24/16 0652  GLUCAP 105* 97 102* 115* 121* 119*    Imaging Dg Chest Portable 1 View  Result Date: 08/23/2016 CLINICAL DATA:  Hypoxia EXAM: PORTABLE CHEST 1 VIEW COMPARISON:  Study obtained earlier in the day FINDINGS: Endotracheal tube tip is 2.6 cm above the carina. Central catheter placed from the right side has its tip in the right atrium slightly beyond the cavoatrial junction. Swan-Ganz catheter tip is in the proximal right main pulmonary artery. Left jugular catheter tip is in the superior vena cava. Nasogastric tube is no longer apparent. Temporary pacemaker wires are attached to the right heart. No pneumothorax. There is perihilar and central interstitial edema. No airspace consolidation. Heart is mildly enlarged. There is mild pulmonary venous hypertension. Prosthetic cardiac valve present. No adenopathy evident. IMPRESSION: Tube and catheter positions as described without pneumothorax. Note that the new catheter placed on the right side has its tip in the right atrium slightly beyond the cavoatrial junction. There is interstitial edema with mild cardiomegaly and mild pulmonary venous hypertension. No airspace consolidation appreciable. Electronically Signed   By: Lowella Grip III M.D.   On: 08/23/2016 15:34    STUDIES:  LHC 9/12: PCI to OM1, severe MR, EF 55 to 60% RHC 9/15: RA 5, RV 37/3/6, PA 38/18/26, PCWP 15, CI 1.9, PVR 3.3 WU TEE 9/15: EF 60 to 65%, flail motion MV with severe MR Port CXR 9/21:  Tubes in lines in good position. Hazy bilateral opacities.  MICROBIOLOGY: MRSA PCR 9/12:  Negative MRSA PCR 9/15:  Negative Tracheal Asp Ctx 9/15:  Strep agalactiae  Blood Ctx x2 9/15:  Negative  MRSA PCR 9/19:  Negative   ANTIBIOTICS: Zosyn 9/16 - 9/17 Vancomycin 9/16 - 9/17 Rocephin 9/17 >> Vancomycin 9/20 (surgical prophylaxis)  SIGNIFICANT  EVENTS: 9/12 Admit to [facility], PCI to [place] 9/14 ETT, CRRT 9/15 to [facility] 9/16 off insulin gtt 9/20 To OR for Mitral Valve Repair w/ bioprosthetic valve  LINES/TUBES: Right IJ PICC 9/12 - 9/20 Rt femoral introducer 9/15 - 9/20 OETT 8.0 9/14 >>  HD cath left IJ 9/14 >> R IJ  Introducer/CVL/Swan 9/20 >> IABP 9/15 >> Lt femoral a line 9/15 >>  Y Chest Tube/Mediastinal Chest Tube 9/20 >> PIV x1  ASSESSMENT / PLAN:  PULMONARY A: Acute Hypoxic Respiratory Failure - Secondary to pulmonary edema. Chest/Mediastinal Tubes  P:   Full vent support Chest tube Mgt per CVTS  CARDIOVASCULAR A:  Inferior/posterior MI. Severe MR w/ ruptured posterior papillary muscle Shock - Combination cardiogenic & septic.  P:  Continuous Telemetry Monitoring Vitals per unit protocol IABP, PA catheter per CVTS/cardiology Post-op Mgt per CVTS/Cardiology ASA, Lipitor, & Lopressor ordered Amiodarone, Epinephrine, Levophed, Nitro, & Primacor gtt's ordered  RENAL A:   Acute Renal Failure - Baseline creatinine 1.6-1.7.  P:   Nephrology Following Likely will need CRRT today for diuresis Trending electrolytes per Nephrology protocol  GASTROINTESTINAL A:   Mild Malnutrition - Related to critical illness.  P:   Pepcid IV q12hr NPO  HEMATOLOGIC A:   Anemia - Hgb stable. No signs of active bleeding. Critical illness. Leukocytosis - Multifactorial. Count stable.  P:  Trending cell counts w/ CBC SCDs  INFECTIOUS A:   Group B Strep Pneumonia  P:   Day #5 of Rocephin & total Day #6 Plan to re-culture for fever Vancomycin Surgical Prophylaxis  ENDOCRINE A:   DM - Glucose controlled.  P:   Transitioning from Insulin gtt to [place] insulin per CVTS  NEUROLOGIC A:   Acute Encephalopathy - Multifactorial and likely combination toxic metabolic & hypoxia.  P:   RASS goal: -1 Precedex gtt D/C Fentanyl gtt, Oxycodone & Morphine Versed IV prn Sedation  TODAY'S SUMMARY:  60 y.o. female now s/p  bioprosthetic mitral valve replacement with ongoing cardiogenic and septic shock. Continuing Rocephin for group B strep pneumonia. Postoperative management of balloon pump, vasopressors, and chest tube per CV S and cardiology. Patient remains critically ill.   I have spent a total of 37 minutes of critical care time today caring for the patient, reviewing the patient's electronic medical record, & discussing plan of care with CVTS.  Sonia Baller Ashok Cordia, M.D. [facility] Pulmonary & Critical Care Pager:  [phone] After 3pm or if no response, call [phone] 08/24/2016, 7:28 AM

## 2016-08-24 NOTE — Anesthesia Postprocedure Evaluation (Signed)
Anesthesia Post Note  Patient: Deborah [name]  Procedure(s) Performed: Procedure(s) (LRB): MITRAL VALVE REPAIR (MVR) USING 25MM EDWARDS MAGNA EASE BIOPROSTHESIS MITRAL  VALVE (N/A) INTRAOPERATIVE TRANSESOPHAGEAL ECHOCARDIOGRAM (N/A) ENDOVEIN HARVEST OF GREATER SAPHENOUS VEIN (Left) CENTRAL LINE INSERTION RIGHT SUBCLAVIAN (Right)  Patient location during evaluation: PACU Anesthesia Type: General Level of consciousness: sedated Pain management: pain level controlled Vital Signs Assessment: post-procedure vital signs reviewed and stable Respiratory status: spontaneous breathing, nonlabored ventilation, respiratory function stable, patient connected to nasal cannula oxygen, patient on ventilator - see flowsheet for VS and patient remains intubated per anesthesia plan Cardiovascular status: blood pressure returned to baseline and stable Postop Assessment: no signs of nausea or vomiting Anesthetic complications: no    Last Vitals:  Vitals:   08/24/16 1800 08/24/16 1815  BP: (!) 95/53   Pulse: (!) 45 (!) 45  Resp: 16 15  Temp: (!) 35 C (!) 35 C    Last Pain:  Vitals:   08/24/16 1600  TempSrc: Core (Comment)                 [name]

## 2016-08-24 NOTE — Care Management Note (Signed)
Case Management Note  Patient Details  Name: Deborah [name] MRN: [id number] Date of Birth: 1956/10/06  Subjective/Objective:       Deborah Garfinkel RN CM 08/17/16            RNCM met with patient's son Deborah [name] [phone] and daughter Deborah [name] [phone] to discuss discharge planning. Patient is vented currently and son Deborah [name] is struggling with patient's current status. I and patient's RN spoke with him to try to comfort him- he appreciate it. Deborah [name] would like to be first line of contact for any medical needs. Patient is from home with Deborah [name]. She was independent with daily activity. Her PCP is Deborah [name]. Patient had been going to [facility] but per Deborah [name] "they were not meeting her mother's medical needs". Deborah [name] has been paying for physician visits for her mother. Patient is potentially going to need outpatient dialysis. She was not taking her medications per Deborah [name] due to cost- patient does not have health insurance. Deborah [name] wants her mother to have home health services- [facility] through [facility] nursing may be appropriate however it is too soon to make those arrangements. Both children are considering SNF if patient too weak to return home. They have met with financial advisors about Medicare and Medicaid per Deborah [name].   Action/Plan: Application to [facility] and Medication Management shared with Deborah [name]. RNCM to continue to follow.   Expected Discharge Date:                  Expected Discharge Plan:  [place]  In-House Referral:     Discharge planning Services     Post Acute Care Choice:    Choice offered to:     DME Arranged:    DME Agency:     HH Arranged:    [place] Agency:     Status of Service:  In process, will continue to follow  If discussed at Long Length of Stay Meetings, dates discussed:    Additional Comments: 08/24/2016 Pt now transferred to 2S s/p  MVR.  Postoperative management of balloon pump, vasopressors, and chest tube per CV  S and cardiology. Patient remains critically ill on ventilator - may need CRRT.  CM will continue to follow for discharge needs Deborah Labrador, RN 08/24/2016, 10:48 AM

## 2016-08-24 NOTE — Progress Notes (Signed)
1 Day Post-Op Procedure(s) (LRB): MITRAL VALVE REPAIR (MVR) USING 25MM EDWARDS MAGNA EASE BIOPROSTHESIS MITRAL  VALVE (N/A) INTRAOPERATIVE TRANSESOPHAGEAL ECHOCARDIOGRAM (N/A) ENDOVEIN HARVEST OF GREATER SAPHENOUS VEIN (Left) CENTRAL LINE INSERTION RIGHT SUBCLAVIAN (Right) Subjective: S/p MVR for MI, posterior papillary muscle infarct CXR wet- will need to start CVVH Goal towean Fio2 , remove fluid slowly IABP to 1:2 later Preop HCAP - strep and MSSA on Rocephin Objective: Vital signs in last 24 hours: Temp:  [95.4 F (35.2 C)-99.5 F (37.5 C)] 98.4 F (36.9 C) (09/21 0700) Pulse Rate:  [56-207] 81 (09/21 0700) Cardiac Rhythm: A-V Sequential paced (09/21 0400) Resp:  [10-36] 22 (09/21 0700) BP: (73-102)/(47-75) 79/54 (09/21 0700) SpO2:  [92 %-100 %] 96 % (09/21 0700) Arterial Line BP: (81-132)/(28-55) 99/34 (09/21 0700) FiO2 (%):  [40 %-80 %] 80 % (09/21 0400) Weight:  [180 lb 12.4 oz (82 kg)] 180 lb 12.4 oz (82 kg) (09/21 0500)  Hemodynamic parameters for last 24 hours: PAP: (33-48)/(13-25) 48/21 CVP:  [4 mmHg] 4 mmHg PCWP:  [2.4 mmHg] 2.4 mmHg CO:  [3.7 L/min-4.5 L/min] 3.7 L/min CI:  [2.1 L/min/m2-2.6 L/min/m2] 2.1 L/min/m2  Intake/Output from previous day: 09/20 0701 - 09/21 0700 In: 5474.1 [I.V.:3345.1; Blood:1979; IV Piggyback:150] Out: 837 [Urine:27; Blood:600; Chest Tube:210] Intake/Output this shift: No intake/output data recorded.  Coarse BS + doppler pulses Lab Results:  Recent Labs  08/23/16 2157 08/24/16 0433  WBC 21.0* 21.4*  HGB 9.1* 8.7*  HCT 27.8* 26.0*  PLT 188 184   BMET:  Recent Labs  08/23/16 0328  08/23/16 2153 08/23/16 2157 08/24/16 0434  NA 135  135  < > 136  --  136  K 4.4  4.6  < > 4.4  --  4.4  CL 100*  102  < > 106  --  106  CO2 24  23  --   --   --  20*  GLUCOSE 186*  184*  < > 131*  --  108*  BUN 32*  32*  < > 32*  --  36*  CREATININE 2.33*  2.31*  < > 2.60* 2.76* 3.01*  CALCIUM 7.7*  7.8*  --   --   --  8.0*  <  > = values in this interval not displayed.  PT/INR:  Recent Labs  08/23/16 1553  LABPROT 18.2*  INR 1.50   ABG    Component Value Date/Time   PHART 7.321 (L) 08/24/2016 0350   HCO3 19.3 (L) 08/24/2016 0350   TCO2 23 08/23/2016 2153   ACIDBASEDEF 5.7 (H) 08/24/2016 0350   O2SAT 95.4 08/24/2016 0350   CBG (last 3)   Recent Labs  08/24/16 0356 08/24/16 0555 08/24/16 0652  GLUCAP 115* 121* 119*    Assessment/Plan: S/P Procedure(s) (LRB): MITRAL VALVE REPAIR (MVR) USING 25MM EDWARDS MAGNA EASE BIOPROSTHESIS MITRAL  VALVE (N/A) INTRAOPERATIVE TRANSESOPHAGEAL ECHOCARDIOGRAM (N/A) ENDOVEIN HARVEST OF GREATER SAPHENOUS VEIN (Left) CENTRAL LINE INSERTION RIGHT SUBCLAVIAN (Right) hope to remove fluid today in order to extubate tomorrow   LOS: 6 days    Tharon Aquas Trigt III 08/24/2016

## 2016-08-24 NOTE — Progress Notes (Signed)
Nutrition Follow-up  DOCUMENTATION CODES:   Not applicable  INTERVENTION:  If unable to extubate, recommend restarting tube feeds: Recommend Vital AF 1.2 formula at goal rate of 45 ml/h (1080 ml per day) and Prostat 30 ml BID to provide 1496 kcals, 111 gm protein, 875 ml free water daily.  NUTRITION DIAGNOSIS:   Inadequate oral intake related to inability to eat as evidenced by NPO status; ongoing  GOAL:   Patient will meet greater than or equal to 90% of their needs; not met  MONITOR:   Vent status, Labs, Weight trends, Skin, I & O's  REASON FOR ASSESSMENT:   Ventilator, Consult Enteral/tube feeding initiation and management  ASSESSMENT:   60 y/o woman with DM2, HTN, HL and noncompliance presented to [facility] ER with infrerior posterior STEMI on 9/12. Taken to cath lab and found to have occluded small to moderate OM-1 branch. Underwent successful PCS with stent x 2 by Dr. Clayborn Bigness. Coronaries otherwise ok. EF 55-60% with severe MR confirmed by echo.    PROCEDURE: (9/20): MITRAL VALVE REPAIR (MVR)  CORONARY ARTERY BYPASS GRAFTING (CABG)  INTRAOPERATIVE TRANSESOPHAGEAL ECHOCARDIOGRAM   Patient is currently intubated on ventilator support MV: 9.6 L/min Temp (24hrs), Avg:97.2 F (36.2 C), Min:95.4 F (35.2 C), Max:98.4 F (36.9 C)  Propofol: none  Tube feeding discontinued prior to surgery 9/20. Plans to resume CRRT today for volume removal in order to hopefully extubate tomorrow. Pt up +9,224 ml fluids since admission. If unable to extubate, tube feeding recommendations have been stated above.  Labs and medications reviewed. Phosphorous elevated at 6.1.  Diet Order:  Diet NPO time specified  Skin:   (Incision on chest)  Last BM:  9/11  Height:   Ht Readings from Last 1 Encounters:  08/18/16 _0  (1.549 m)    Weight:   Wt Readings from Last 1 Encounters:  08/24/16 180 lb 12.4 oz (82 kg)  Admit weight (08/18/16) 156 lb (71.2 kg)  Ideal Body Weight:  47.7  kg  BMI:  Body mass index is 34.16 kg/m.  Estimated Nutritional Needs:   Kcal:  1427  Protein:  105-115 grams  Fluid:  Per MD  EDUCATION NEEDS:   No education needs identified at this time  Corrin Parker, MS, RD, LDN Pager # [phone] After hours/ weekend pager # [phone]

## 2016-08-24 NOTE — Progress Notes (Signed)
Patient ID: Deborah [name], female   DOB: 08/25/1956, 60 y.o.   MRN: [id number]   60 y/o woman with DM2, HTN, HL and noncompliance presented to [facility] ER with infrerior posterior STEMI on 9/12. Taken to cath lab and found to have occluded small to moderate OM-1 branch. Underwent successful PCS with stent x 2 by Dr. Clayborn Bigness. Coronaries otherwise ok. EF 55-60% with severe MR confirmed by echo.   Post-cath developed respiratory failure and required intubation. Developed shock and renal failure with peak creatinine 3.4 (1.6 on admit). Trialysis catheter placed.   Patient persistently acidotic with concern for ischemic MR and transferred here. En route develop severe hypotension and norepinephrine started.   Brought to cath lab 9/15 and TEE showed normal LV (EF 60-65%) and normal RV function with ruptured posterior papillary muscle with severe posterior MR.  Underwent placement of Swan and IABP.   S/P MVR 08/23/16. Currently on norepi 20 mcg, neo 35 mcg, milrinone 0.25 mcg, epi 3, lidocaine 2 mg per hour, amio 30 mg per hour. Started on lidocaine drip this morning for frequent PVCs.   Scheduled Meds: . acetaminophen  1,000 mg Oral Q6H   Or  . acetaminophen (TYLENOL) oral liquid 160 mg/5 mL  1,000 mg Per Tube Q6H  . albumin human  12.5 g Intravenous Q6H  . aspirin EC  325 mg Oral Daily   Or  . aspirin  324 mg Per Tube Daily  . atorvastatin  20 mg Oral q1800  . bisacodyl  10 mg Oral Daily   Or  . bisacodyl  10 mg Rectal Daily  . cefTRIAXone (ROCEPHIN)  IV  2 g Intravenous Q24H  . chlorhexidine gluconate (MEDLINE KIT)  15 mL Mouth Rinse BID  . docusate sodium  200 mg Oral Daily  . famotidine (PEPCID) IV  20 mg Intravenous Q24H  . insulin aspart  0-24 Units Subcutaneous Q4H  . insulin aspart  0-9 Units Subcutaneous Q4H  . insulin detemir  10 Units Subcutaneous BID  . magnesium sulfate  4 g Intravenous Once  . mouth rinse  15 mL Mouth Rinse 10 times per day  . metoCLOPramide (REGLAN)  injection  10 mg Intravenous Q6H  . metoprolol tartrate  12.5 mg Oral BID   Or  . metoprolol tartrate  12.5 mg Per Tube BID  . sodium chloride flush  3 mL Intravenous Q12H  . vancomycin  500 mg Intravenous Q24H   Continuous Infusions: . sodium chloride 10 mL/hr at 08/24/16 0400  . sodium chloride    . sodium chloride 10 mL/hr at 08/24/16 0400  . amiodarone 30 mg/hr (08/24/16 0910)  . dexmedetomidine 1.2 mcg/kg/hr (08/24/16 1135)  . epinephrine 3 mcg/min (08/24/16 0800)  . insulin (NOVOLIN-R) infusion 1.8 Units/hr (08/24/16 0800)  . lactated ringers    . lactated ringers    . lidocaine 2 mg/min (08/24/16 1000)  . milrinone 0.25 mcg/kg/min (08/24/16 0800)  . nitroGLYCERIN    . norepinephrine (LEVOPHED) Adult infusion 20 mcg/min (08/24/16 0800)  . phenylephrine (NEO-SYNEPHRINE) Adult infusion 50 mcg/min (08/24/16 0929)  . dialysis replacement fluid (prismasate)    . dialysis replacement fluid (prismasate)    . dialysate (PRISMASATE)     PRN Meds:.sodium chloride, acetaminophen, albumin human, bisacodyl, docusate, fentaNYL (SUBLIMAZE) injection, heparin, lactated ringers, metoprolol, midazolam, ondansetron (ZOFRAN) IV, ondansetron (ZOFRAN) IV, sodium chloride, sodium chloride flush, sodium chloride flush    Vitals:   08/24/16 0830 08/24/16 0946 08/24/16 0947 08/24/16 1146  BP:  Pulse: (!) 108 (!) 117  94  Resp: (!) 23 17  (!) 27  Temp: 98.2 F (36.8 C) 98.2 F (36.8 C)  97 F (36.1 C)  TempSrc:      SpO2: 97% 97% 97% 96%  Weight:      Height:        Intake/Output Summary (Last 24 hours) at 08/24/16 1205 Last data filed at 08/24/16 0929  Gross per 24 hour  Intake          5086.02 ml  Output              842 ml  Net          4244.02 ml    LABS: Basic Metabolic Panel:  Recent Labs  08/23/16 0328  08/23/16 2153 08/23/16 2157 08/24/16 0433 08/24/16 0434  NA 135  135  < > 136  --   --  136  K 4.4  4.6  < > 4.4  --   --  4.4  CL 100*  102  < > 106  --   --   106  CO2 24  23  --   --   --   --  20*  GLUCOSE 186*  184*  < > 131*  --   --  108*  BUN 32*  32*  < > 32*  --   --  36*  CREATININE 2.33*  2.31*  < > 2.60* 2.76*  --  3.01*  CALCIUM 7.7*  7.8*  --   --   --   --  8.0*  MG 2.1  --   --  2.1 2.2  --   PHOS 3.5  --   --   --   --  6.1*  < > = values in this interval not displayed. Liver Function Tests:  Recent Labs  08/22/16 0315  08/23/16 0328 08/24/16 0434  AST 38  --  55*  --   ALT 29  --  37  --   ALKPHOS 79  --  113  --   BILITOT 0.5  --  0.5  --   PROT 5.3*  --  6.2*  --   ALBUMIN 1.4*  < > 1.6*  1.6* 1.5*  < > = values in this interval not displayed.  Recent Labs  08/22/16 0315  AMYLASE 399*   CBC:  Recent Labs  08/22/16 0315  08/23/16 0328  08/23/16 2157 08/24/16 0433  WBC 15.6*  < > 22.5*  < > 21.0* 21.4*  NEUTROABS 11.3*  --  15.6*  --   --   --   HGB 8.7*  < > 9.3*  < > 9.1* 8.7*  HCT 27.0*  < > 28.4*  < > 27.8* 26.0*  MCV 86.5  < > 87.7  < > 89.1 87.2  PLT 262  < > 351  < > 188 184  < > = values in this interval not displayed. Cardiac Enzymes: No results for input(s): CKTOTAL, CKMB, CKMBINDEX, TROPONINI in the last 72 hours. BNP: Invalid input(s): POCBNP D-Dimer: No results for input(s): DDIMER in the last 72 hours. Hemoglobin A1C: No results for input(s): HGBA1C in the last 72 hours. Fasting Lipid Panel: No results for input(s): CHOL, HDL, LDLCALC, TRIG, CHOLHDL, LDLDIRECT in the last 72 hours. Thyroid Function Tests: No results for input(s): TSH, T4TOTAL, T3FREE, THYROIDAB in the last 72 hours.  Invalid input(s): FREET3 Anemia Panel: No results for input(s): VITAMINB12, FOLATE, FERRITIN, TIBC, IRON, RETICCTPCT  in the last 72 hours.  RADIOLOGY: Dg Chest 1 View  Result Date: 08/17/2016 CLINICAL DATA:  Endotracheal tube placement, OG tube placement EXAM: CHEST 1 VIEW COMPARISON:  Portable chest x-ray of 08/17/2016 FINDINGS: The tip of the endotracheal tube is within the right mainstem  bronchus and needs to be withdrawn by approximately 5 cm per OG tube extends below the hemidiaphragm. Bilateral primarily perihilar airspace disease again is noted most consistent with edema or possibly aspiration. A small left pleural effusion is noted. Right central venous line is unchanged. IMPRESSION: 1. Endotracheal tip is within the right mainstem bronchus and needs to be withdrawn by approximately 5 cm. 2. OG tube extends below the hemidiaphragm. 3. Little change in bilateral airspace disease with small left pleural effusion. Electronically Signed   By: Ivar Drape M.D.   On: 08/17/2016 08:05   Dg Chest 1 View  Result Date: 08/17/2016 CLINICAL DATA:  Acute respiratory failure. EXAM: CHEST 1 VIEW COMPARISON:  08/15/2016 FINDINGS: Type of the right central line in the mid SVC. The bilateral perihilar opacities are becoming more confluent, air bronchograms non noted in the right suprahilar region. Stable cardiomegaly. Increasing left lung base atelectasis. Small pleural effusion on prior exam is not well visualized, suspect unchanged. No pneumothorax. IMPRESSION: Coalescing perihilar bilateral opacities, suspicious for increasing pulmonary edema. Increasing left lung base atelectasis. Electronically Signed   By: Jeb Levering M.D.   On: 08/17/2016 03:15   Dg Chest 1 View  Result Date: 08/15/2016 CLINICAL DATA:  Respiratory failure EXAM: CHEST 1 VIEW COMPARISON:  January 22, 2010 FINDINGS: The mediastinal contour is normal. The heart size is enlarged. The diffuse airspace opacities identified throughout bilateral lungs. There is probably a minimal left pleural effusion. The osseous structures are normal. IMPRESSION: Congestive heart failure. Electronically Signed   By: Abelardo Diesel M.D.   On: 08/15/2016 19:25   Dg Abd 1 View  Result Date: 08/17/2016 CLINICAL DATA:  Endotracheal tube placement, OG tube placement EXAM: ABDOMEN - 1 VIEW COMPARISON:  CT abdomen pelvis of 12/10/2010 FINDINGS: The OG  tube tip extends into the body the stomach. The bowel gas pattern is nonspecific. Contrast is noted being excreted by the kidneys and within the urinary bladder secondary to recent CT of the abdomen pelvis. The bowel gas pattern is nonspecific. There may be a small left pleural effusion present. IMPRESSION: OG tube tip extends into the body the stomach. The bowel gas pattern is nonspecific. Electronically Signed   By: Ivar Drape M.D.   On: 08/17/2016 08:09   Dg Chest Port 1 View  Result Date: 08/24/2016 CLINICAL DATA:  Mitral valve replacement, chest tube EXAM: PORTABLE CHEST 1 VIEW COMPARISON:  08/23/2016 FINDINGS: PA support devices including right chest tube, endotracheal tube Swan-Ganz catheter are unchanged. No pneumothorax. Diffuse interstitial prominence throughout the lungs is increased, likely mild edema. Mild cardiomegaly. Left base atelectasis crash that left base atelectasis has increased. IMPRESSION: Increasing interstitial prominence throughout the lungs, likely worsening interstitial edema. Increasing left base atelectasis. Electronically Signed   By: Rolm Baptise M.D.   On: 08/24/2016 08:41   Dg Chest Portable 1 View  Result Date: 08/23/2016 CLINICAL DATA:  Hypoxia EXAM: PORTABLE CHEST 1 VIEW COMPARISON:  Study obtained earlier in the day FINDINGS: Endotracheal tube tip is 2.6 cm above the carina. Central catheter placed from the right side has its tip in the right atrium slightly beyond the cavoatrial junction. Swan-Ganz catheter tip is in the proximal right main pulmonary artery. Left jugular  catheter tip is in the superior vena cava. Nasogastric tube is no longer apparent. Temporary pacemaker wires are attached to the right heart. No pneumothorax. There is perihilar and central interstitial edema. No airspace consolidation. Heart is mildly enlarged. There is mild pulmonary venous hypertension. Prosthetic cardiac valve present. No adenopathy evident. IMPRESSION: Tube and catheter positions  as described without pneumothorax. Note that the new catheter placed on the right side has its tip in the right atrium slightly beyond the cavoatrial junction. There is interstitial edema with mild cardiomegaly and mild pulmonary venous hypertension. No airspace consolidation appreciable. Electronically Signed   By: Lowella Grip III M.D.   On: 08/23/2016 15:34   Dg Chest Port 1 View  Result Date: 08/23/2016 CLINICAL DATA:  Respiratory failure. Acute on chronic diastolic heart failure. Cardiogenic shock. EXAM: PORTABLE CHEST 1 VIEW COMPARISON:  08/22/2016, 08/21/2016 and 08/20/2016 and 08/15/2016 FINDINGS: Endotracheal tube, NG tube and right and left jugular vein central catheters appear in good position, unchanged. Intra aortic balloon pump in place. Heart size and pulmonary vascularity are now normal. Small bilateral pleural effusions. Slight haziness at the lung bases is most likely secondary to the effusions although I think there is an element of slight residual pulmonary edema as well. IMPRESSION: Improving pulmonary edema.  Persistent small bilateral effusions. Electronically Signed   By: Lorriane Shire M.D.   On: 08/23/2016 07:44   Dg Chest Port 1 View  Result Date: 08/22/2016 CLINICAL DATA:  Heart failure.  Balloon pump advanced. EXAM: PORTABLE CHEST 1 VIEW COMPARISON:  08/22/2016 at 4:50 a.m. FINDINGS: Metallic tip of the balloon pump projects over the aortic knob, well positioned. Endotracheal tube, right and left internal jugular central venous lines, femoral a placed Swan-Ganz catheter and nasal/orogastric tube are stable in well positioned. Lung aeration appears mildly improved although the difference may be technical only. There still vascular congestion and interstitial and hazy airspace opacity consistent with asymmetric pulmonary edema. Small pleural effusions are noted. IMPRESSION: 1. Intra-aortic balloon pump tip projects over the aortic knob, well positioned. 2. Possible mild  improvement in lung aeration since the earlier study although the difference may be technical only. No other evidence of a change. Electronically Signed   By: Lajean Manes M.D.   On: 08/22/2016 16:34   Dg Chest Port 1 View  Result Date: 08/22/2016 CLINICAL DATA:  Respiratory failure, shortness of Breath EXAM: PORTABLE CHEST 1 VIEW COMPARISON:  08/21/2016 FINDINGS: Support devices are stable. Cardiomegaly. Bilateral perihilar and lower lobe opacities throughout the lungs have increased slightly since prior study, likely mild edema. Suspect small layering effusions. IMPRESSION: Mild interval worsening of pulmonary edema/ CHF. Small layering effusions. Electronically Signed   By: Rolm Baptise M.D.   On: 08/22/2016 07:30   Dg Chest Port 1 View  Result Date: 08/21/2016 CLINICAL DATA:  Acute hypoxemic respiratory failure EXAM: PORTABLE CHEST 1 VIEW COMPARISON:  08/20/2016 FINDINGS: Support devices are stable. Endotracheal tube remains at the level of the carina. Borderline heart size. Bilateral airspace opacities are again noted, likely edema. Findings stable or slightly worsened since prior study. Possible small right effusion. IMPRESSION: Slight interval worsening in pulmonary edema pattern. Small right effusion. Endotracheal tube remains at the level of the carina and could be retracted 2-3 cm for optimal positioning. Electronically Signed   By: Rolm Baptise M.D.   On: 08/21/2016 07:40   Dg Chest Port 1 View  Result Date: 08/20/2016 CLINICAL DATA:  Pt was hypotensive with respiratory failure x a few days  ago. Hx of DM, and HTN. Pt had a cardiac cath on 08-15-16. EXAM: PORTABLE CHEST 1 VIEW COMPARISON:  08/19/2016 FINDINGS: Endotracheal to appears low at the level of the carina. LEFT and RIGHT central venous line are unchanged. Swan-Ganz catheter from an inferior approach unchanged. There is some improvement aeration lung bases. Mild pulmonary edema remains. No pneumothorax. IMPRESSION: 1. Endotracheal tube  appears low at the level the carina. 2. Stable support apparatus. 3. Slight improved aeration lung bases with persistent mild edema. Electronically Signed   By: Suzy Bouchard M.D.   On: 08/20/2016 07:58   Dg Chest Port 1 View  Result Date: 08/19/2016 CLINICAL DATA:  Pulmonary edema EXAM: PORTABLE CHEST 1 VIEW COMPARISON:  08/18/2016 FINDINGS: External pad artifact. Endotracheal tube, central venous lines, and NG tube unchanged. Swan-Ganz catheter and intra aortic balloon pump unchanged. Stable cardiac silhouette. Mild central venous pulmonary congestion. No overt pulmonary edema. No pneumothorax. IMPRESSION: 1. Stable support apparatus. 2. Central venous congestion.  No significant change. Electronically Signed   By: Suzy Bouchard M.D.   On: 08/19/2016 07:55   Dg Chest Port 1 View  Result Date: 08/18/2016 CLINICAL DATA:  Placement of femoral Swan-Ganz catheter and intra-aortic balloon pump catheter. EXAM: PORTABLE CHEST 1 VIEW 4:14 p.m.: COMPARISON:  Portable chest x-ray earlier today 3:37 p.m. and previously. FINDINGS: Femoral Swan-Ganz catheter tip projects at the expected location of the right middle lobe pulmonary artery. Intra-aortic balloon pump catheter tip projects over the proximal descending thoracic aorta. Endotracheal tube tip now projects approximately 2-3 cm above the carina. Right jugular central venous catheter tip projects over the upper SVC. Left jugular central venous catheter tip projects over the lower SVC. Nasogastric tube courses below the diaphragm into the stomach. Improved aeration in the left lower lobe since the most recent examination 45 minutes earlier, though streaky opacities persist. Improved perihilar airspace pulmonary edema in both lungs. No new pulmonary parenchymal abnormalities. IMPRESSION: 1. Swan-Ganz catheter tip projects at the expected location of the right middle lobe pulmonary artery. 2. Intra-aortic balloon pump catheter tip projects over the proximal  descending thoracic aorta. 3. Remaining support apparatus satisfactory. 4. Improved aeration in both lungs since the examination performed 45 minutes ago, although streaky left lower lobe atelectasis and/or pneumonia and mild perihilar airspace pulmonary edema persists. 5. No new abnormalities. Electronically Signed   By: Evangeline Dakin M.D.   On: 08/18/2016 16:43   Dg Chest Port 1 View  Result Date: 08/18/2016 CLINICAL DATA:  ST-elevation myocardial infarction and cardiogenic shock. EXAM: PORTABLE CHEST 1 VIEW COMPARISON:  Yesterday FINDINGS: Endotracheal tube tip 1 cm above the carina. Central line on the right and dialysis catheter on the left with tips at the lower SVC. Orogastric tube reaches the stomach at least. Stable borderline heart size. Worsening aeration at the left base with newly obscured diaphragm. Perihilar airspace disease. IMPRESSION: 1. Lower endotracheal tube with tip 1 cm above the carina. 2. New atelectasis or consolidation in the left lower lobe with small effusion. 3. Pulmonary edema. Electronically Signed   By: Monte Fantasia M.D.   On: 08/18/2016 07:02   Dg Chest Port 1 View  Result Date: 08/17/2016 CLINICAL DATA:  Central line placement EXAM: PORTABLE CHEST 1 VIEW COMPARISON:  Earlier same day FINDINGS: Endotracheal tube is been withdrawn with the tip 2.5 cm proximal to the carina. Right internal jugular central line remains in place with its tip in the SVC above the right atrium. New dual-lumen left internal jugular central line  has its tip in the SVC above the right atrium. No pneumothorax. Bilateral bat wing airspace filling is seen consistent with acute edema or acute airspace filling due to other etiologies. No effusions. No collapse. IMPRESSION: Left IJ central line well position with tip in the SVC above the right atrium. No pneumothorax. Endotracheal tube repositioned, well positioned with tip 2.5 cm above the carina. Persistent worsen bat wing edema or airspace filling  pattern. Electronically Signed   By: Nelson Chimes M.D.   On: 08/17/2016 10:35   Dg Chest Port 1 View  Result Date: 08/15/2016 CLINICAL DATA:  Central line placement EXAM: PORTABLE CHEST 1 VIEW COMPARISON:  August 15, 2016 7:04 p.m. FINDINGS: Right central venous line is identified with distal tip in superior vena cava. There is no pneumothorax. The heart size mildly enlarged. Diffuse airspace opacities identified throughout both lungs. The bones are unchanged. IMPRESSION: Right central venous line distal tip in the superior vena cava. There is no pneumothorax. Pulmonary edema unchanged compared prior exam. Electronically Signed   By: Abelardo Diesel M.D.   On: 08/15/2016 21:10   Dg Abd Portable 1v  Result Date: 08/22/2016 CLINICAL DATA:  Balloon pump position EXAM: PORTABLE ABDOMEN - 1 VIEW COMPARISON:  Chest x-ray 08/22/2016.  KUB 08/17/2016 FINDINGS: Right femoral Swan-Ganz catheter extends above the diaphragm . Intra-aortic balloon pump marker appears overlying the L3 vertebral body in previously was in the thoracic aorta. Left femoral venous catheter in the left iliac vein. NG tube in the stomach. Normal bowel gas pattern. IMPRESSION: Intra-aortic balloon pump marker  overlies the L3 vertebral body. Electronically Signed   By: Franchot Gallo M.D.   On: 08/22/2016 13:12   Dg Abd Portable 1v  Result Date: 08/19/2016 CLINICAL DATA:  Acidosis EXAM: PORTABLE ABDOMEN - 1 VIEW COMPARISON:  08/17/2016 FINDINGS: No dilated loops large or small bowel. NG tube in stomach. Swan-Ganz catheter noted LEFT basilar atelectasis. The kidneys are dense suggesting retained IV contrast . IMPRESSION: 1. Normal bowel-gas pattern. 2. Retained contrast in the kidneys consistent consistent with acute kidney injury. Electronically Signed   By: Suzy Bouchard M.D.   On: 08/19/2016 08:02    PHYSICAL EXAM General: Intubated/sedated Neck: No JVD, no thyromegaly or thyroid nodule. RIJ swan  Lungs: Crackles dependently.    CV: Nondisplaced PMI.  Heart regular S1/S2, no S3/S4, 3/6 HSM apex.  No peripheral edema.   Abdomen: Soft, no hepatosplenomegaly, no distention.  Neurologic: Sedated.   Extremities: No clubbing or cyanosis. Right groin site w/o hematoma. + IABP in place GU: Foley    TELEMETRY: Reviewed telemetry pt in NSR   Assessment:   1. Cardiogenic shock: Due to acute severe MR.  2. CAD: Inferior posterior STEMI on 9/13 with DES x 2 to OM-1  3. 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  4. AKI on CKD stage III - due to ATN likely from cardiogenic shock + contrast with cath. Has required CVVH.  5. Acute respiratory failure: Pulmonary edema.  6. DM2 7. Suspect component of septic shock: Possible PNA, group B Strep in sputum culture.  8. Anemia 9. PVCs     Plan/Discussion:    She is critically ill with cardiogenic shock in setting of inferior STEMI c/b cardiogenic shock, severe ischemic MR due to ruptured papillary muscle and AKI.   S/P MVR . IABP remains 1:2. cardiac output 4.0. On norepi 20 mcg, neo 35 mcg, milrinone 0.25 mcg, epi 3, lidocaine 2  mg per hour, amio 30 mg per hour.       PCT elevated, possible Strep PNA/sepsis.  Continue vanco/Zosyn.    Last dose of Brilinta on 9/15, now on tirofiban bridge as we wait for Brilinta wash-out prior to MV surgery.   Anemia.  Hgb 8.7 .   Starting CVVH today.     Continue heparin for IABP.   Amy Clegg NP-C  08/24/2016 12:05 PM   Patient seen and examined with Darrick Grinder, NP. We discussed all aspects of the encounter. I agree with the assessment and plan as stated above.   She remains intubated post-operatively. IABP still in place. She is volume overloaded and requiring a significant amount of pressors. Agree with need for volume removal with CVVHD. Rhythm now stable. Would try to wean lido off soon to avoid toxicity. Wean pressors and IABP as tolerated.    Remains on tirofiban bridge for recent stent. D/w Dr. Prescott Gum.   The patient is critically ill with multiple organ systems failure and requires high complexity decision making for assessment and support, frequent evaluation and titration of therapies, application of advanced monitoring technologies and extensive interpretation of multiple databases.   Critical Care Time devoted to patient care services described in this note is 35 Minutes.  Bensimhon, Daniel,MD 10:55 PM

## 2016-08-24 NOTE — Op Note (Signed)
NAME:  Deborah [name], Deborah [name] NO.:  [id number]  MEDICAL RECORD NO.:  [id number]  LOCATION:                                 FACILITY:  PHYSICIAN:  Ivin Poot, M.D.  DATE OF BIRTH:  May 10, 1956  DATE OF PROCEDURE:  08/23/2016 DATE OF DISCHARGE:                              OPERATIVE REPORT   OPERATION: 1. Mitral valve replacement with a 25 mm pericardial [facility]     Ease valve serial [id number]. 2. Endoscopic harvest of left leg greater saphenous vein. 3. Placement of right subclavian vein triple-lumen catheter for IV     access.  SURGEON:  Ivin Poot, MD  ASSISTANT:  Marisue Brooklyn, PA-C  ANESTHESIA:  General by Midge Minium, MD  PREOPERATIVE DIAGNOSES: 1. Acute myocardial infarction, ischemic mitral regurgitation from     infarcted posterior papillary muscle, cardiogenic shock, requiring     intubation, balloon pump, and subsequent dialysis prior to surgery. 2. Single-vessel coronary artery disease of a small OM1, treated with     percutaneous coronary intervention and drug-eluting stent by Dr.     Clayborn Bigness.  POSTOPERATIVE DIAGNOSES: 1. Acute myocardial infarction, ischemic mitral regurgitation from     infarcted posterior papillary muscle, cardiogenic shock, requiring     intubation, balloon pump, and subsequent dialysis prior to surgery. 2. Single-vessel coronary artery disease of a small OM1, treated with     percutaneous coronary intervention and drug-eluting stent by Dr.     Clayborn Bigness.  CLINICAL NOTE:  The patient is a 60 year old, female who presented to an outside hospital with acute chest pain, shortness of breath, and positive cardiac enzymes.  Cardiac catheterization demonstrated occluded OM1 which was a small to medium-sized vessel, and this was treated with acute PCI which opened the vessel.  She also had severe mitral regurgitation at that time, which was felt to be of lesser degree.  She subsequently developed shock, acute  renal failure, required intubation and became hypertensive.  She was transferred to this facility for emergency balloon pump placement and inotropic support.  She subsequently developed fevers and streptococcal and staphylococcal pneumonia which responded to antibiotics.  She remained anuric and was started on CVVH.  Thoracic Surgical evaluation was requested.  I reviewed the patient's cath and echo and discussed her situation with the Cardiology attendings.  It was felt that mitral valve replacement with possible single-vessel CABG would be her best chance for survival. I discussed the procedure in detail with the patient and family and they understood the risks involved including death, stroke, permanent renal failure, postoperative pulmonary problems including pleural effusion, postoperative infection, blood transfusion therapy.  They understood this condition would be unsurvivable unless she underwent surgery. Informed consent was obtained.  OPERATIVE FINDINGS: 1. Dilated left and right ventricles from severe MR. 2. Infarcted posterior papillary muscle with flail of the posterior     leaflet. 3. Adequate perfusion of the myocardium served by the OM1 and finding     of the OM1 too small to graft.  For that reason, the saphenous vein     which was harvested was not used.  OPERATIVE PROCEDURE:  The patient was brought to  the operating room directly from the ICU, intubated on balloon pump.  General anesthesia was induced.  A new Swan catheter was placed through the right IJ and the old central line was removed.  The patient's chest, abdomen, and legs were prepped with Betadine and draped as a sterile field.  A proper time-out was performed.  A sternal incision was made after the transesophageal echo probe was placed which confirmed the preoperative diagnosis of severe mitral regurgitation.  As the sternotomy was being made, the left leg was prepared for endoscopic vein harvest of the  thigh vein.  The sternal retractor was placed in the sternum.  The pericardium was opened.  There was fair amount of pericardial fluid.  The pericardium was elevated and pursestrings were placed in ascending aorta and right atrium.  Heparin was administered and the ACT was documented as being therapeutic.  The patient was coagulopathic from preoperative Brilinta as well as Aggrastat more recently.  When the ACT was documented as being therapeutic, the patient was cannulated and placed on cardiopulmonary bypass.  A 2nd venous cannula was placed for bi-cable venous drainage.  Cardioplegia cannulas were placed, both antegrade and retrograde cold blood cardioplegia and the interatrial groove was dissected.  The patient was cooled 32 degrees.  The aortic crossclamp was applied.  A liter of cold blood cardioplegia was delivered in split doses between the antegrade aortic and retrograde coronary sinus catheters.  There was good cardioplegic arrest and septal temperature dropped less than 12 degrees.  Cardioplegia was delivered every 20 minutes.  The heart was elevated and the circumflex marginal vessel was examined. The stent was evident.  The vessel beyond the stent was small.  Since this area of the heart appeared to have normal function by TEE, a bypass graft was not performed.  The heart was then replaced.  A left atriotomy incision was made and the left atrial retractor blades were positioned.  Exposure of the mitral valve was somewhat difficult due to the small size of the atrium, but it was adequate.  The valve was inspected.  The posterior leaflet was flail.  The posterior papillary muscle was infarcted and necrotic, but still attached to the endocardium.  The chordee were all flail however. The posterior leaflet was resected.  The anterior leaflet was inspected. There also appeared to be a flail segment of A2.  The anterior leaflet was resected with care to preserve some of the  cords at the area of the commissures.  Valve suture placed in the supra-annular position using 2-0 Ethibond pledgeted sutures.  Thirteen sutures were placed in all.  These were placed anteriorly to make sure there was no interference of the outflow tract with any remaining leaflet tissue.  The annulus was sized to a 25 mm Magna Ease mitral valve.  The valve was prepared according to protocol.  The sutures were placed through the sewing ring of the valve. The valve was seated and the sutures were all tied.  The valve conformed to the orifice well.  There was no evidence of perivalvular space for leaks.  The valve leaflets moved well with the saline test.  The atriotomy was then closed using a running 4-0 Prolene in two layers. CO2 was insufflated during the operative field procedure.  Air was vented from the coronaries with a dose of retrograde warm blood cardioplegia and the crossclamp was removed.  The patient was rewarmed and reperfused.  The atriotomy was checked and found to be hemostatic.  Temporary pacing  wires were applied.  Both pleural spaces were drained of pleural effusions.  The lungs were expanded and the ventilator was resumed.  The patient was placed on low- dose inotropes.  Balloon pump was re-initiated.  The patient was adequately rewarmed, reperfused.  She weaned from cardiopulmonary bypass without difficulty.  Echo showed normal function of the new mitral valve.  There was preserved biventricular function.  Protamine was administered without adverse reaction.  There was still diffuse coagulopathy after reversal of heparin with protamine.  The patient was given FFP and platelets and subsequently cryoprecipitate with improved coagulation function.  The patient remained stable.  Chest tubes were placed in the anterior mediastinal right pleural space.  The superior pericardial fat was closed over the aorta.  The sternum was closed with wire.  The patient remained  stable.  The pectoralis fascia was closed with a running #1 Vicryl.  The subcutaneous and skin layers were closed in running Vicryl.  Sterile dressings were applied.  Total cardiopulmonary bypass time was 130 minutes.     Ivin Poot, M.D.   ______________________________ Ivin Poot, M.D.    PV/MEDQ  D:  08/23/2016  T:  08/24/2016  Job:  [id number]  cc:   Shaune Pascal. Bensimhon, MD

## 2016-08-24 NOTE — Progress Notes (Signed)
[facility] Progress Note    Subjective:    POD 1 CABG/MVR Hemodynamically labile We are requested to resume CRRT today for volume removal    Objective:   BP (!) 88/70 (BP Location: Left Arm)   Pulse (!) 117   Temp 98.2 F (36.8 C)   Resp 17   Ht _0  (1.549 m) Comment: from 08/15/16 encounter  Wt 82 kg (180 lb 12.4 oz)   SpO2 97%   BMI 34.16 kg/m   Intake/Output Summary (Last 24 hours) at 08/24/16 1053 Last data filed at 08/24/16 0929  Gross per 24 hour  Intake          5886.02 ml  Output              842 ml  Net          5044.02 ml   Weight trending            08/24/16  82 kg CRRT resumed  08/23/16  78 kg CRRT stopped for OR  08/22/16  79.3  kg   08/21/16 0500  78.7 kg  CRRT initiated  08/20/16 0500  77.9 kg    08/19/16 0500  72.5 kg    08/18/16 1709  71.2 kg    08/18/16 1457  72.9 kg      Physical Exam: Intubated, sedated, on IABP 1:1 BP (!) 88/70 (BP Location: Left Arm)   Pulse (!) 117   Temp 98.2 F (36.8 C)   Resp 17   Ht _1  (1.549 m) Comment: from 08/15/16 encounter  Wt 82 kg (180 lb 12.4 oz)   SpO2 97%   BMI 34.16 kg/m   CVP 14 PCWP 22 CI 2 ETT, R triple lumen/SGC, left HD cath (placed 9/14 at [facility]), left fem art line, R fem IAPB, 2 chest tubes, R SCV line, foley IABP 1:1/paced Lungs posteriorly fairly clear 2 anterior chest tubes serosanguinous drainage Abd not tender Trace-1+ edema bilat LE's and UE's  Long dressing RLE from vein harvest site  Imaging: Dg Chest Port 1 View  Result Date: 08/24/2016 CLINICAL DATA:  Mitral valve replacement, chest tube EXAM: PORTABLE CHEST 1 VIEW COMPARISON:  08/23/2016 FINDINGS: PA support devices including right chest tube, endotracheal tube Swan-Ganz catheter are unchanged. No pneumothorax. Diffuse interstitial prominence throughout the lungs is increased, likely mild edema. Mild cardiomegaly. Left base atelectasis crash that left base atelectasis has increased. IMPRESSION: Increasing  interstitial prominence throughout the lungs, likely worsening interstitial edema. Increasing left base atelectasis. Electronically Signed   By: Rolm Baptise M.D.   On: 08/24/2016 08:41   Dg Chest Portable 1 View  Result Date: 08/23/2016 CLINICAL DATA:  Hypoxia EXAM: PORTABLE CHEST 1 VIEW COMPARISON:  Study obtained earlier in the day FINDINGS: Endotracheal tube tip is 2.6 cm above the carina. Central catheter placed from the right side has its tip in the right atrium slightly beyond the cavoatrial junction. Swan-Ganz catheter tip is in the proximal right main pulmonary artery. Left jugular catheter tip is in the superior vena cava. Nasogastric tube is no longer apparent. Temporary pacemaker wires are attached to the right heart. No pneumothorax. There is perihilar and central interstitial edema. No airspace consolidation. Heart is mildly enlarged. There is mild pulmonary venous hypertension. Prosthetic cardiac valve present. No adenopathy evident. IMPRESSION: Tube and catheter positions as described without pneumothorax. Note that the new catheter placed on the right side has its tip in the right atrium slightly beyond the cavoatrial junction. There is  interstitial edema with mild cardiomegaly and mild pulmonary venous hypertension. No airspace consolidation appreciable. Electronically Signed   By: Lowella Grip III M.D.   On: 08/23/2016 15:34   Dg Chest Port 1 View  Result Date: 08/23/2016 CLINICAL DATA:  Respiratory failure. Acute on chronic diastolic heart failure. Cardiogenic shock. EXAM: PORTABLE CHEST 1 VIEW COMPARISON:  08/22/2016, 08/21/2016 and 08/20/2016 and 08/15/2016 FINDINGS: Endotracheal tube, NG tube and right and left jugular vein central catheters appear in good position, unchanged. Intra aortic balloon pump in place. Heart size and pulmonary vascularity are now normal. Small bilateral pleural effusions. Slight haziness at the lung bases is most likely secondary to the effusions  although I think there is an element of slight residual pulmonary edema as well. IMPRESSION: Improving pulmonary edema.  Persistent small bilateral effusions. Electronically Signed   By: Lorriane Shire M.D.   On: 08/23/2016 07:44   Dg Chest Port 1 View  Result Date: 08/22/2016 CLINICAL DATA:  Heart failure.  Balloon pump advanced. EXAM: PORTABLE CHEST 1 VIEW COMPARISON:  08/22/2016 at 4:50 a.m. FINDINGS: Metallic tip of the balloon pump projects over the aortic knob, well positioned. Endotracheal tube, right and left internal jugular central venous lines, femoral a placed Swan-Ganz catheter and nasal/orogastric tube are stable in well positioned. Lung aeration appears mildly improved although the difference may be technical only. There still vascular congestion and interstitial and hazy airspace opacity consistent with asymmetric pulmonary edema. Small pleural effusions are noted. IMPRESSION: 1. Intra-aortic balloon pump tip projects over the aortic knob, well positioned. 2. Possible mild improvement in lung aeration since the earlier study although the difference may be technical only. No other evidence of a change. Electronically Signed   By: Lajean Manes M.D.   On: 08/22/2016 16:34   Dg Abd Portable 1v  Result Date: 08/22/2016 CLINICAL DATA:  Balloon pump position EXAM: PORTABLE ABDOMEN - 1 VIEW COMPARISON:  Chest x-ray 08/22/2016.  KUB 08/17/2016 FINDINGS: Right femoral Swan-Ganz catheter extends above the diaphragm . Intra-aortic balloon pump marker appears overlying the L3 vertebral body in previously was in the thoracic aorta. Left femoral venous catheter in the left iliac vein. NG tube in the stomach. Normal bowel gas pattern. IMPRESSION: Intra-aortic balloon pump marker  overlies the L3 vertebral body. Electronically Signed   By: Franchot Gallo M.D.   On: 08/22/2016 13:12    Labs: [email or web address]  Recent Labs Lab 08/18/16 0518  08/18/16 1500  08/20/16 0500 08/21/16 0436 08/21/16 1600  08/22/16 0315  08/22/16 1731 08/23/16 0328  08/23/16 1102 08/23/16 1119 08/23/16 1151 08/23/16 1250 08/23/16 1353 08/23/16 1557 08/23/16 2153 08/23/16 2157 08/24/16 0434  NA 137  136  < > 135  < > 136 135 135 137  < > 136 135  135  < > 136 138 135 137 140 140 136  --  136  K 4.3  4.3  < > 4.7  < > 4.1 5.0 4.6 4.3  < > 4.2 4.4  4.6  < > 4.4 3.9 4.9 4.5 4.6 4.0 4.4  --  4.4  CL 106  105  < > 105  < > 106 105 105 106  < > 103 100*  102  < > 102 100* 103 105 107  --  106  --  106  CO2 27  26  < > 20*  < > 22 20* 23 22  --  _0 --   --   --   --   --   --   --   --   --  20*  GLUCOSE 203*  200*  < > 355*  < > 161* 272* 263* 221*  < > 234* 186*  184*  < > 167* 153* 177* 162* 184* 151* 131*  --  108*  BUN 34*  32*  < > 39*  < > 58* 69* 64* 49*  < > 40* 32*  32*  < > 32* 29* 30* 31* 34*  --  32*  --  36*  CREATININE 2.29*  2.31*  < > 2.77*  < > 3.94* 4.96* 4.26* 3.11*  < > 2.88* 2.33*  2.31*  < > 2.50* 2.20* 2.10* 2.30* 2.30*  --  2.60* 2.76* 3.01*  CALCIUM 8.4*  8.2*  < > 8.1*  < > 7.3* 7.4* 7.3* 7.2*  --  7.9* 7.7*  7.8*  --   --   --   --   --   --   --   --   --  8.0*  PHOS 2.2*  2.1*  --  2.7  --   --   --  2.9 2.3*  --  2.9 3.5  --   --   --   --   --   --   --   --   --  6.1*  < > = values in this interval not displayed. CBC  Recent Labs Lab 08/20/16 0500  08/21/16 0436  08/22/16 0315  08/23/16 0328  08/23/16 1224  08/23/16 1553 08/23/16 1557 08/23/16 2153 08/23/16 2157 08/24/16 0433  WBC 12.6*  < > 14.2*  < > 15.6*  < > 22.5*  --   --   --  21.8*  --   --  21.0* 21.4*  NEUTROABS 9.5*  --  10.4*  --  11.3*  --  15.6*  --   --   --   --   --   --   --   --   HGB 7.5*  < > 8.2*  < > 8.7*  < > 9.3*  < > 7.4*  < > 7.9* 9.2* 8.8* 9.1* 8.7*  HCT 22.9*  < > 25.9*  < > 27.0*  < > 28.4*  < > 22.7*  < > 23.8* 27.0* 26.0* 27.8* 26.0*  MCV 85.4  < > 87.8  < > 86.5  < > 87.7  --   --   --  88.1  --   --  89.1 87.2  PLT 230  < > 235  < > 262  < > 351  --  168  --   194  --   --  188 184  < > = values in this interval not displayed.  Medications:    . acetaminophen  1,000 mg Oral Q6H   Or  . acetaminophen (TYLENOL) oral liquid 160 mg/5 mL  1,000 mg Per Tube Q6H  . albumin human  12.5 g Intravenous Q6H  . aspirin EC  325 mg Oral Daily   Or  . aspirin  324 mg Per Tube Daily  . atorvastatin  20 mg Oral q1800  . bisacodyl  10 mg Oral Daily   Or  . bisacodyl  10 mg Rectal Daily  . cefTRIAXone (ROCEPHIN)  IV  2 g Intravenous Q24H  . chlorhexidine gluconate (MEDLINE KIT)  15 mL Mouth Rinse BID  . docusate sodium  200 mg Oral Daily  . famotidine (PEPCID) IV  20 mg Intravenous Q24H  . insulin aspart  0-24 Units Subcutaneous Q4H  . insulin  aspart  0-9 Units Subcutaneous Q4H  . insulin detemir  10 Units Subcutaneous BID  . magnesium sulfate  4 g Intravenous Once  . mouth rinse  15 mL Mouth Rinse 10 times per day  . metoCLOPramide (REGLAN) injection  10 mg Intravenous Q6H  . metoprolol tartrate  12.5 mg Oral BID   Or  . metoprolol tartrate  12.5 mg Per Tube BID  . sodium chloride flush  3 mL Intravenous Q12H  . vancomycin  500 mg Intravenous Q24H   Infusions:  . sodium chloride 10 mL/hr at 08/24/16 0400  . sodium chloride    . sodium chloride 10 mL/hr at 08/24/16 0400  . amiodarone 30 mg/hr (08/24/16 0910)  . dexmedetomidine 1.2 mcg/kg/hr (08/24/16 0700)  . epinephrine 3 mcg/min (08/24/16 0800)  . insulin (NOVOLIN-R) infusion 1.8 Units/hr (08/24/16 0800)  . lactated ringers    . lactated ringers    . lidocaine 2 mg/min (08/24/16 1000)  . milrinone 0.25 mcg/kg/min (08/24/16 0800)  . nitroGLYCERIN    . norepinephrine (LEVOPHED) Adult infusion 20 mcg/min (08/24/16 0800)  . phenylephrine (NEO-SYNEPHRINE) Adult infusion 50 mcg/min (08/24/16 0929)   Background:  60 y/o woman with DM2, HTN, HL and noncompliance presented to [facility] ER with infrerior posterior STEMI on 9/12->cath->. PCS with stent x 2 by Dr. Clayborn Bigness. EF 55-60% with severe MR  confirmed by echo.  Post-cath developed respiratory failure and required intubation. Developed shock and renal failure (creatinine 1.6 on adm). Trialysis catheter placed and pt had CRRT at [facility] for 48 hours prior to transfer here on 08/18/16->cath lab -> ruptured posterior papillary muscle with severe MR->IABP/Swan. We were asked to see. D/T progressive rise in creatinine and volume excess with plan for mitral valve surgery.  CRRT re-initiated 08/21/16-9/20, then 9/21 post MVR/CABG   Assessment/ Plan:    Acute renal failure on CKD III:  Creatinine 1.6 on admission to [facility]  AKI from cardiogenic shock with evolution to ATN and contrast nephropathy after emergent coronary angiogram/intervention at [place] anuric Rec'd CRRT 9/18-9/20 in prep for OR and we are restarting today for volume management Remove 50/hour as BP tolerates/no heparin/all 4K fluids  Cardiogenic shock status post acute inferopost STEMI/posterior papillary muscle/Severe MR: S/P PCI with 2 stents to OM1 branch [facility]) S/P CABG/MVR 9/20 Hemodynamically very labile on multiple pressors/IABP/intropes  Anemia S/p PRBC's X 1 9/17.  VDRF Ventilator management per CCM.  Group B streptococci + sputum cultures V/Z changed to rocephin Also now on vanco for surgical prophylaxis   Jamal Maes, MD [place] Pager 08/24/2016, 10:53 AM   .

## 2016-08-24 NOTE — Progress Notes (Signed)
[facility] BRIEF SICU PROGRESS NOTE  1 Day Post-Op  S/P Procedure(s) (LRB): MITRAL VALVE REPAIR (MVR) USING 25MM EDWARDS MAGNA EASE BIOPROSTHESIS MITRAL  VALVE (N/A) INTRAOPERATIVE TRANSESOPHAGEAL ECHOCARDIOGRAM (N/A) ENDOVEIN HARVEST OF GREATER SAPHENOUS VEIN (Left) CENTRAL LINE INSERTION RIGHT SUBCLAVIAN (Right)   Sedated on vent AV paced w/ stable hemodynamics but still on Epi, Levophed, milrinone and Neo drips w/ IABP 1:2 O2 sats 98-100% on 70% FiO2 Trying to pull some volume via CVVHD Hgb down to 7.5 on iSTAT but repeat CBC pending  Plan: Continue current plan.  Consider transfusion PRBC's pending results of CBC  Rexene Alberts, MD 08/24/2016 5:32 PM

## 2016-08-25 ENCOUNTER — Inpatient Hospital Stay (HOSPITAL_COMMUNITY): Payer: Medicaid Other

## 2016-08-25 DIAGNOSIS — D649 Anemia, unspecified: Secondary | ICD-10-CM

## 2016-08-25 LAB — POCT I-STAT, CHEM 8
BUN: 25 mg/dL — ABNORMAL HIGH (ref 6–20)
CHLORIDE: 101 mmol/L (ref 101–111)
Calcium, Ion: 1.12 mmol/L — ABNORMAL LOW (ref 1.15–1.40)
Creatinine, Ser: 2.6 mg/dL — ABNORMAL HIGH (ref 0.44–1.00)
Glucose, Bld: 168 mg/dL — ABNORMAL HIGH (ref 65–99)
HEMATOCRIT: 36 % (ref 36.0–46.0)
Hemoglobin: 12.2 g/dL (ref 12.0–15.0)
POTASSIUM: 4.6 mmol/L (ref 3.5–5.1)
SODIUM: 133 mmol/L — AB (ref 135–145)
TCO2: 23 mmol/L (ref 0–100)

## 2016-08-25 LAB — POCT I-STAT 3, ART BLOOD GAS (G3+)
ACID-BASE DEFICIT: 4 mmol/L — AB (ref 0.0–2.0)
ACID-BASE DEFICIT: 3 mmol/L — AB (ref 0.0–2.0)
Acid-base deficit: 4 mmol/L — ABNORMAL HIGH (ref 0.0–2.0)
Bicarbonate: 20.4 mmol/L (ref 20.0–28.0)
Bicarbonate: 21.2 mmol/L (ref 20.0–28.0)
Bicarbonate: 22.4 mmol/L (ref 20.0–28.0)
O2 SAT: 99 %
O2 Saturation: 98 %
O2 Saturation: 93 %
PCO2 ART: 33.3 mmHg (ref 32.0–48.0)
PCO2 ART: 38.2 mmHg (ref 32.0–48.0)
PH ART: 7.39 (ref 7.350–7.450)
PH ART: 7.357 (ref 7.350–7.450)
PH ART: 7.375 (ref 7.350–7.450)
PO2 ART: 102 mmHg (ref 83.0–108.0)
PO2 ART: 65 mmHg — AB (ref 83.0–108.0)
Patient temperature: 36.5
TCO2: 21 mmol/L (ref 0–100)
TCO2: 22 mmol/L (ref 0–100)
TCO2: 24 mmol/L (ref 0–100)
pCO2 arterial: 37.5 mmHg (ref 32.0–48.0)
pO2, Arterial: 111 mmHg — ABNORMAL HIGH (ref 83.0–108.0)

## 2016-08-25 LAB — COMPREHENSIVE METABOLIC PANEL
ALT: 146 U/L — ABNORMAL HIGH (ref 14–54)
AST: 320 U/L — ABNORMAL HIGH (ref 15–41)
Albumin: 2.4 g/dL — ABNORMAL LOW (ref 3.5–5.0)
Alkaline Phosphatase: 127 U/L — ABNORMAL HIGH (ref 38–126)
Anion gap: 12 (ref 5–15)
BUN: 29 mg/dL — ABNORMAL HIGH (ref 6–20)
CO2: 21 mmol/L — ABNORMAL LOW (ref 22–32)
Calcium: 7.8 mg/dL — ABNORMAL LOW (ref 8.9–10.3)
Chloride: 99 mmol/L — ABNORMAL LOW (ref 101–111)
Creatinine, Ser: 2.72 mg/dL — ABNORMAL HIGH (ref 0.44–1.00)
GFR calc Af Amer: 21 mL/min — ABNORMAL LOW (ref >60)
GFR calc non Af Amer: 18 mL/min — ABNORMAL LOW (ref >60)
Glucose, Bld: 176 mg/dL — ABNORMAL HIGH (ref 65–99)
Potassium: 4.9 mmol/L (ref 3.5–5.1)
Sodium: 132 mmol/L — ABNORMAL LOW (ref 135–145)
Total Bilirubin: 0.8 mg/dL (ref 0.3–1.2)
Total Protein: 5.1 g/dL — ABNORMAL LOW (ref 6.5–8.1)

## 2016-08-25 LAB — HEMOGLOBIN AND HEMATOCRIT, BLOOD
HCT: 29.5 % — ABNORMAL LOW (ref 36.0–46.0)
Hemoglobin: 9.9 g/dL — ABNORMAL LOW (ref 12.0–15.0)

## 2016-08-25 LAB — GLUCOSE, CAPILLARY
GLUCOSE-CAPILLARY: 171 mg/dL — AB (ref 65–99)
GLUCOSE-CAPILLARY: 161 mg/dL — AB (ref 65–99)
GLUCOSE-CAPILLARY: 142 mg/dL — AB (ref 65–99)
GLUCOSE-CAPILLARY: 163 mg/dL — AB (ref 65–99)
Glucose-Capillary: 198 mg/dL — ABNORMAL HIGH (ref 65–99)
Glucose-Capillary: 171 mg/dL — ABNORMAL HIGH (ref 65–99)

## 2016-08-25 LAB — CARBOXYHEMOGLOBIN
Carboxyhemoglobin: 1.1 % (ref 0.5–1.5)
Methemoglobin: 1.1 % (ref 0.0–1.5)
O2 SAT: 50.8 %
TOTAL HEMOGLOBIN: 6.8 g/dL — AB (ref 12.0–16.0)

## 2016-08-25 LAB — CBC
HCT: 19.8 % — ABNORMAL LOW (ref 36.0–46.0)
Hemoglobin: 6.6 g/dL — CL (ref 12.0–15.0)
MCH: 28.4 pg (ref 26.0–34.0)
MCHC: 33.3 g/dL (ref 30.0–36.0)
MCV: 85.3 fL (ref 78.0–100.0)
Platelets: 176 10*3/uL (ref 150–400)
RBC: 2.32 MIL/uL — ABNORMAL LOW (ref 3.87–5.11)
RDW: 15.1 % (ref 11.5–15.5)
WBC: 26.2 10*3/uL — ABNORMAL HIGH (ref 4.0–10.5)

## 2016-08-25 LAB — RENAL FUNCTION PANEL
ALBUMIN: 2.4 g/dL — AB (ref 3.5–5.0)
ANION GAP: 11 (ref 5–15)
ANION GAP: 11 (ref 5–15)
Albumin: 2.4 g/dL — ABNORMAL LOW (ref 3.5–5.0)
BUN: 29 mg/dL — ABNORMAL HIGH (ref 6–20)
BUN: 25 mg/dL — ABNORMAL HIGH (ref 6–20)
CALCIUM: 7.8 mg/dL — AB (ref 8.9–10.3)
CO2: 21 mmol/L — AB (ref 22–32)
CO2: 21 mmol/L — ABNORMAL LOW (ref 22–32)
CREATININE: 2.6 mg/dL — AB (ref 0.44–1.00)
Calcium: 7.7 mg/dL — ABNORMAL LOW (ref 8.9–10.3)
Chloride: 100 mmol/L — ABNORMAL LOW (ref 101–111)
Chloride: 101 mmol/L (ref 101–111)
Creatinine, Ser: 2.41 mg/dL — ABNORMAL HIGH (ref 0.44–1.00)
GFR calc non Af Amer: 19 mL/min — ABNORMAL LOW (ref >60)
GFR calc non Af Amer: 21 mL/min — ABNORMAL LOW (ref >60)
GFR, EST AFRICAN AMERICAN: 22 mL/min — AB (ref >60)
GFR, EST AFRICAN AMERICAN: 24 mL/min — AB (ref >60)
GLUCOSE: 176 mg/dL — AB (ref 65–99)
Glucose, Bld: 171 mg/dL — ABNORMAL HIGH (ref 65–99)
PHOSPHORUS: 5.2 mg/dL — AB (ref 2.5–4.6)
PHOSPHORUS: 4.6 mg/dL (ref 2.5–4.6)
POTASSIUM: 4.7 mmol/L (ref 3.5–5.1)
Potassium: 4.9 mmol/L (ref 3.5–5.1)
SODIUM: 132 mmol/L — AB (ref 135–145)
SODIUM: 133 mmol/L — AB (ref 135–145)

## 2016-08-25 LAB — POCT ACTIVATED CLOTTING TIME: Activated Clotting Time: 125 seconds

## 2016-08-25 LAB — PREPARE RBC (CROSSMATCH)

## 2016-08-25 LAB — MAGNESIUM: Magnesium: 2.3 mg/dL (ref 1.7–2.4)

## 2016-08-25 LAB — CALCIUM, IONIZED: Calcium, Ionized, Serum: 4.4 mg/dL — ABNORMAL LOW (ref 4.5–5.6)

## 2016-08-25 MED ORDER — NEPRO/CARBSTEADY PO LIQD
1000.0000 mL | Freq: Three times a day (TID) | ORAL | Status: DC
  Filled 2016-08-25 (×4): qty 1000

## 2016-08-25 MED ORDER — CLOPIDOGREL BISULFATE 75 MG PO TABS
300.0000 mg | ORAL_TABLET | Freq: Once | ORAL | Status: AC
Start: 2016-08-25 — End: 2016-08-25
  Administered 2016-08-25: 300 mg via ORAL
  Filled 2016-08-25: qty 4

## 2016-08-25 MED ORDER — SODIUM BICARBONATE 8.4 % IV SOLN
50.0000 meq | Freq: Once | INTRAVENOUS | Status: AC
  Administered 2016-08-25: 50 meq via INTRAVENOUS
  Filled 2016-08-25: qty 50

## 2016-08-25 MED ORDER — CLOPIDOGREL BISULFATE 75 MG PO TABS
75.0000 mg | ORAL_TABLET | Freq: Every day | ORAL | Status: DC
  Administered 2016-08-26 – 2016-09-05 (×10): 75 mg via ORAL
  Filled 2016-08-25 (×9): qty 1

## 2016-08-25 MED ORDER — SODIUM CHLORIDE 0.9 % IV SOLN
Freq: Once | INTRAVENOUS | Status: AC
  Administered 2016-08-25: 30 mL via INTRAVENOUS

## 2016-08-25 MED ORDER — NEPRO/CARBSTEADY PO LIQD
1000.0000 mL | ORAL | Status: DC
  Administered 2016-08-25 – 2016-08-27 (×3): 1000 mL
  Filled 2016-08-25 (×3): qty 1000

## 2016-08-25 MED ORDER — NEPRO/CARBSTEADY PO LIQD
237.0000 mL | Freq: Three times a day (TID) | ORAL | Status: DC
  Filled 2016-08-25 (×5): qty 237

## 2016-08-25 NOTE — Progress Notes (Signed)
[facility] Progress Note    Subjective:    Remains on multiple pressors and inotropes Not sure just how much fluid has actually been removed Goal has been 50/hour Filter lasting 48 hours without heparin For 2 units blood today TCTS would like increased fluid removal rate   Objective:   BP (!) 77/59   Pulse (!) 44   Temp (!) 96.4 F (35.8 C)   Resp (!) 21   Ht 5' 1"  (1.549 m) Comment: from 08/15/16 encounter  Wt 85.8 kg (189 lb 2.5 oz)   SpO2 100%   BMI 35.74 kg/m   Intake/Output Summary (Last 24 hours) at 08/25/16 1126 Last data filed at 08/25/16 1040  Gross per 24 hour  Intake          3816.02 ml  Output             3641 ml  Net           175.02 ml   Weight trending      08/25/16  85.8   08/24/16  82 kg CRRT resumed  08/23/16  78 kg CRRT stopped for OR  08/22/16  79.3  kg   08/21/16 0500  78.7 kg  CRRT initiated  08/20/16 0500  77.9 kg    08/19/16 0500  72.5 kg    08/18/16 1709  71.2 kg    08/18/16 1457  72.9 kg      Physical Exam: Intubated, sedated, on IABP 1:1 BP (!) 77/59   Pulse (!) 44   Temp (!) 96.4 F (35.8 C)   Resp (!) 21   Ht 5' 1"  (1.549 m) Comment: from 08/15/16 encounter  Wt 85.8 kg (189 lb 2.5 oz)   SpO2 100%   BMI 35.74 kg/m   CVP 14 PCWP 22 PAP 38/22 CI 2 ETT, R triple lumen/SGC, left HD cath (placed 9/14 at [facility]), left fem art line, R fem IAPB, 2 chest tubes, R SCV line, foley IABP 1:1/paced Generalized edema Lungs posteriorly fairly clear 2 anterior chest tubes serosanguinous drainage Abd not tender 2+ bilat LE's and UE's  Long dressing RLE from vein harvest site/IABP in right leg  Imaging: Dg Chest Port 1 View  Result Date: 08/25/2016 CLINICAL DATA:  Status post mitral valve replacement EXAM: PORTABLE CHEST 1 VIEW COMPARISON:  08/24/2016 FINDINGS: Cardiac shadow is stable. Intra-aortic balloon pump is again noted just below the aortic knob. Changes of mitral valve replacement are again seen. Swan-Ganz catheter  is noted in the pulmonary outflow tract. Temporary dialysis catheter is noted via the left jugular approach. Right subclavian central line is noted as well. Endotracheal tube and mediastinal drain are again noted. The degree of vascular congestion has improved somewhat in the interval from the prior exam although persists. Bilateral pleural effusions right greater than left are noted. No focal confluent infiltrate is seen. IMPRESSION: Persistent but improved vascular congestion with bilateral effusions. Tubes and lines as described. Electronically Signed   By: Inez Catalina M.D.   On: 08/25/2016 08:40   Dg Chest Port 1 View  Result Date: 08/24/2016 CLINICAL DATA:  Mitral valve replacement, chest tube EXAM: PORTABLE CHEST 1 VIEW COMPARISON:  08/23/2016 FINDINGS: PA support devices including right chest tube, endotracheal tube Swan-Ganz catheter are unchanged. No pneumothorax. Diffuse interstitial prominence throughout the lungs is increased, likely mild edema. Mild cardiomegaly. Left base atelectasis crash that left base atelectasis has increased. IMPRESSION: Increasing interstitial prominence throughout the lungs, likely worsening interstitial edema. Increasing left base atelectasis.  Electronically Signed   By: Rolm Baptise M.D.   On: 08/24/2016 08:41   Dg Chest Portable 1 View  Result Date: 08/23/2016 CLINICAL DATA:  Hypoxia EXAM: PORTABLE CHEST 1 VIEW COMPARISON:  Study obtained earlier in the day FINDINGS: Endotracheal tube tip is 2.6 cm above the carina. Central catheter placed from the right side has its tip in the right atrium slightly beyond the cavoatrial junction. Swan-Ganz catheter tip is in the proximal right main pulmonary artery. Left jugular catheter tip is in the superior vena cava. Nasogastric tube is no longer apparent. Temporary pacemaker wires are attached to the right heart. No pneumothorax. There is perihilar and central interstitial edema. No airspace consolidation. Heart is mildly  enlarged. There is mild pulmonary venous hypertension. Prosthetic cardiac valve present. No adenopathy evident. IMPRESSION: Tube and catheter positions as described without pneumothorax. Note that the new catheter placed on the right side has its tip in the right atrium slightly beyond the cavoatrial junction. There is interstitial edema with mild cardiomegaly and mild pulmonary venous hypertension. No airspace consolidation appreciable. Electronically Signed   By: Lowella Grip III M.D.   On: 08/23/2016 15:34    Labs: BMET  Recent Labs Lab 08/21/16 1600 08/22/16 0315  08/22/16 1731 08/23/16 0328  08/23/16 1250 08/23/16 1353 08/23/16 1557 08/23/16 2153 08/23/16 2157 08/24/16 0434 08/24/16 1700 08/24/16 1715 08/25/16 0400  NA 135 137  < > 136 135  135  < > 137 140 140 136  --  136 134* 134* 132*  132*  K 4.6 4.3  < > 4.2 4.4  4.6  < > 4.5 4.6 4.0 4.4  --  4.4 4.6 4.5 4.9  4.9  CL 105 106  < > 103 100*  102  < > 105 107  --  106  --  106 103 103 99*  100*  CO2 23 22  --  22 24  23   --   --   --   --   --   --  20* 18*  --  21*  21*  GLUCOSE 263* 221*  < > 234* 186*  184*  < > 162* 184* 151* 131*  --  108* 136* 131* 176*  176*  BUN 64* 49*  < > 40* 32*  32*  < > 31* 34*  --  32*  --  36* 35* 32* 29*  29*  CREATININE 4.26* 3.11*  < > 2.88* 2.33*  2.31*  < > 2.30* 2.30*  --  2.60* 2.76* 3.01* 3.11* 3.30* 2.72*  2.60*  CALCIUM 7.3* 7.2*  --  7.9* 7.7*  7.8*  --   --   --   --   --   --  8.0* 7.9*  --  7.8*  7.8*  PHOS 2.9 2.3*  --  2.9 3.5  --   --   --   --   --   --  6.1* 6.5*  --  5.2*  < > = values in this interval not displayed. CBC  Recent Labs Lab 08/20/16 0500  08/21/16 0436  08/22/16 0315  08/23/16 0328  08/23/16 2157 08/24/16 0433 08/24/16 1700 08/24/16 1715 08/25/16 0400  WBC 12.6*  < > 14.2*  < > 15.6*  < > 22.5*  < > 21.0* 21.4* 26.7*  --  26.2*  NEUTROABS 9.5*  --  10.4*  --  11.3*  --  15.6*  --   --   --   --   --   --  HGB 7.5*  < > 8.2*   < > 8.7*  < > 9.3*  < > 9.1* 8.7* 7.4* 7.5* 6.6*  HCT 22.9*  < > 25.9*  < > 27.0*  < > 28.4*  < > 27.8* 26.0* 21.8* 22.0* 19.8*  MCV 85.4  < > 87.8  < > 86.5  < > 87.7  < > 89.1 87.2 85.8  --  85.3  PLT 230  < > 235  < > 262  < > 351  < > 188 184 173  --  176  < > = values in this interval not displayed.  Medications:    . acetaminophen  1,000 mg Oral Q6H   Or  . acetaminophen (TYLENOL) oral liquid 160 mg/5 mL  1,000 mg Per Tube Q6H  . aspirin EC  325 mg Oral Daily   Or  . aspirin  324 mg Per Tube Daily  . atorvastatin  20 mg Oral q1800  . bisacodyl  10 mg Oral Daily   Or  . bisacodyl  10 mg Rectal Daily  . cefTRIAXone (ROCEPHIN)  IV  2 g Intravenous Q24H  . chlorhexidine gluconate (MEDLINE KIT)  15 mL Mouth Rinse BID  . docusate sodium  200 mg Oral Daily  . famotidine (PEPCID) IV  20 mg Intravenous Q24H  . insulin aspart  0-24 Units Subcutaneous Q4H  . insulin detemir  10 Units Subcutaneous BID  . magnesium sulfate  4 g Intravenous Once  . mouth rinse  15 mL Mouth Rinse 10 times per day  . metoCLOPramide (REGLAN) injection  10 mg Intravenous Q6H  . metoprolol tartrate  12.5 mg Oral BID   Or  . metoprolol tartrate  12.5 mg Per Tube BID  . sodium chloride flush  3 mL Intravenous Q12H   Infusions:  . sodium chloride 10 mL/hr at 08/25/16 0400  . sodium chloride    . sodium chloride 10 mL/hr at 08/25/16 0400  . amiodarone 30 mg/hr (08/25/16 0700)  . dexmedetomidine 1 mcg/kg/hr (08/25/16 0700)  . epinephrine 3 mcg/min (08/25/16 0400)  . feeding supplement (NEPRO CARB STEADY)    . lactated ringers    . lactated ringers    . lidocaine 2 mg/min (08/25/16 0400)  . milrinone 0.25 mcg/kg/min (08/25/16 0400)  . nitroGLYCERIN    . norepinephrine (LEVOPHED) Adult infusion 24 mcg/min (08/25/16 0400)  . phenylephrine (NEO-SYNEPHRINE) Adult infusion 40 mcg/min (08/25/16 0400)  . dialysis replacement fluid (prismasate) 300 mL/hr at 08/25/16 0700  . dialysis replacement fluid  (prismasate) 200 mL/hr at 08/24/16 1355  . dialysate (PRISMASATE) 1,000 mL/hr at 08/25/16 0547   Background:  60 y/o woman with DM2, HTN, HL and noncompliance presented to [facility] ER with infrerior posterior STEMI on 9/12->cath->. PCS with stent x 2 by Dr. Clayborn Bigness. EF 55-60% with severe MR confirmed by echo.  Post-cath developed respiratory failure and required intubation. Developed shock and renal failure (creatinine 1.6 on adm). Trialysis catheter placed and pt had CRRT at [facility] for 48 hours prior to transfer here on 08/18/16->cath lab -> ruptured posterior papillary muscle with severe MR->IABP/Swan. We were asked to see. D/T progressive rise in creatinine and volume excess with plan for mitral valve surgery.  CRRT re-initiated 08/21/16-9/20, then 9/21 post MVR.    Assessment/ Plan:    Acute renal failure on CKD III:  Creatinine 1.6 on admission to [facility]  AKI from cardiogenic shock with evolution to ATN and contrast nephropathy after emergent coronary angiogram/intervention at [facility] with cardiogenic shock  Remains anuric Rec'd CRRT 9/18-9/20 in prep for OR and restarted 1 day post op on 9/21 Goal has been net neg 50/hour - weights and exam do not reflect that.  Increase goal to net neg 100/hour with goal MAP of 65 - Dr. Prescott Gum is OK with increasing pressors to maintain MAP (per RN) Continue all 4 K fluids/no heparin/no K or phos replacement needed yet  Cardiogenic/septic shock status post acute inferopost STEMI/posterior papillary muscle/Severe MR: S/P PCI with 2 stents to OM1 branch [facility]) S/P MVR 9/20 Remains on IABP with NE, neo, milrinone, epi, lidocaine, amio Hemodynamically very labile on multiple pressors/IABP/intropes Vanco and zosyn ->now ceftriaxone  Anemia S/p PRBC's X 1 9/17. For 2 units 9/22 d/t Hb 6.6  VDRF Ventilator management per CCM.  DM Per primary service    Deborah Maes, MD [facility] [phone] Pager 08/25/2016, 11:26 AM   .

## 2016-08-25 NOTE — Progress Notes (Signed)
PULMONARY / CRITICAL CARE MEDICINE   Name: Deborah [name] MRN: [id number] DOB: Nov 27, 1956    ADMISSION DATE:  08/18/2016 CONSULTATION DATE:  08/18/2016  REFERRING MD:  Bensimhon   CHIEF COMPLAINT:  Ventilator management and critical care services   HISTORY OF PRESENT ILLNESS:   60 yo female admitted to [facility] 08/15/16 with STEMI s/p cath to [place].  Developed cardiogenic shock, VDRF (ETT 9/14), AKI with metabolic acidosis and started on CRRT 08/17/16.  Transferred to [facility] to tx shock and evaluated new severe MR from papillary muscle rupture.  She also had fever and found to have Group B Strep in sputum culture.  SUBJECTIVE: Patient clotted CVVHD filter this morning. No other acute events overnight.  REVIEW OF SYSTEMS:  Unable to obtain with patient on ventilator.  VITAL SIGNS: BP (!) 93/54   Pulse 90   Temp (!) 96.6 F (35.9 C)   Resp 15   Ht 5\' 1"  (1.549 m) Comment: from 08/15/16 encounter  Wt 189 lb 2.5 oz (85.8 kg)   SpO2 100%   BMI 35.74 kg/m   HEMODYNAMICS: PAP: (37-51)/(17-34) 38/21 CO:  [3.5 L/min-5 L/min] 4 L/min CI:  [2 L/min/m2-2.7 L/min/m2] 2.2 L/min/m2  VENTILATOR SETTINGS: Vent Mode: PRVC FiO2 (%):  [60 %-70 %] 60 % Set Rate:  [16 bmp-163 bmp] 16 bmp Vt Set:  [400 mL] 400 mL PEEP:  [5 cmH20] 5 cmH20 Plateau Pressure:  [15 cmH20-19 cmH20] 17 cmH20  INTAKE / OUTPUT: I/O last 3 completed shifts: In: 6102.5 [I.V.:4807.5; Blood:335; NG/GT:210; IV Piggyback:750] Out: 9379 [Urine:22; Emesis/NG output:500; KWIOX:7353; Chest Tube:110]  PHYSICAL EXAMINATION: General: Sedated. No family at bedside. Nurse at bedside exchanging dialysis filter. Neuro: Pupils pinpoint. No spontaneous movements. HEENT: ETT in place. No scleral icterus. Moist mucus membranes. Cardiac: Regular rhythm. Worsening anasarca. IABP in place. Chest: Decreased breath sounds bilaterally. Symmetric chest rise on vent. Abd: Soft. Protuberant. Hypoactive BS. Integument:  Warm & dry. No rash on exposed  skin. Chest tubes & lines in place.  LABS:  BMET  Recent Labs Lab 08/24/16 0434 08/24/16 1700 08/24/16 1715 08/25/16 0400  NA 136 134* 134* 132*  132*  K 4.4 4.6 4.5 4.9  4.9  CL 106 103 103 99*  100*  CO2 20* 18*  --  21*  21*  BUN 36* 35* 32* 29*  29*  CREATININE 3.01* 3.11* 3.30* 2.72*  2.60*  GLUCOSE 108* 136* 131* 176*  176*    Electrolytes  Recent Labs Lab 08/23/16 2157 08/24/16 0433 08/24/16 0434 08/24/16 1700 08/25/16 0400  CALCIUM  --   --  8.0* 7.9* 7.8*  7.8*  MG 2.1 2.2  --   --  2.3  PHOS  --   --  6.1* 6.5* 5.2*    CBC  Recent Labs Lab 08/24/16 0433 08/24/16 1700 08/24/16 1715 08/25/16 0400  WBC 21.4* 26.7*  --  26.2*  HGB 8.7* 7.4* 7.5* 6.6*  HCT 26.0* 21.8* 22.0* 19.8*  PLT 184 173  --  176    Coag's  Recent Labs Lab 08/23/16 1553  APTT 40*  INR 1.50    Sepsis Markers  Recent Labs Lab 08/19/16 1451 08/19/16 1706 08/20/16 0500 08/21/16 0436  LATICACIDVEN 1.4  --  1.3  --   PROCALCITON  --  5.44 5.24 4.50    ABG  Recent Labs Lab 08/24/16 1500 08/24/16 2019 08/25/16 0214  PHART 7.343* 7.352 7.390  PCO2ART 36.4 34.3 33.3  PO2ART 65.0* 89.0 111.0*    Liver Enzymes  Recent Labs Lab 08/22/16 0315  08/23/16 0328 08/24/16 0434 08/24/16 1700 08/25/16 0400  AST 38  --  55*  --   --  320*  ALT 29  --  37  --   --  146*  ALKPHOS 79  --  113  --   --  127*  BILITOT 0.5  --  0.5  --   --  0.8  ALBUMIN 1.4*  < > 1.6*  1.6* 1.5* 2.1* 2.4*  2.4*  < > = values in this interval not displayed.  Cardiac Enzymes No results for input(s): TROPONINI, PROBNP in the last 168 hours.  Glucose  Recent Labs Lab 08/24/16 1153 08/24/16 1304 08/24/16 1408 08/24/16 1926 08/24/16 2335 08/25/16 0357  GLUCAP 104* 130* 101* 142* 181* 171*    Imaging No results found.  STUDIES:  LHC 9/12: PCI to OM1, severe MR, EF 55 to 60% RHC 9/15: RA 5, RV 37/3/6, PA 38/18/26, PCWP 15, CI 1.9, PVR 3.3 WU TEE 9/15: EF 60 to  65%, flail motion MV with severe MR Port CXR 9/21:  Tubes in lines in good position. Hazy bilateral opacities. Port CXR 9/22: Lines, tubes, and endotracheal tube in good position. Minimal improvement and pulmonary vascular congestion. Bilateral pleural effusions appear present still.  MICROBIOLOGY: MRSA PCR 9/12:  Negative MRSA PCR 9/15:  Negative Tracheal Asp Ctx 9/15:  Strep agalactiae  Blood Ctx x2 9/15:  Negative  MRSA PCR 9/19:  Negative   ANTIBIOTICS: Zosyn 9/16 - 9/17 Vancomycin 9/16 - 9/17 Rocephin 9/17 >> Vancomycin 9/20 (surgical prophylaxis)  SIGNIFICANT EVENTS: 9/12 Admit to [facility], PCI to [place] 9/14 ETT, CRRT 9/15 to [facility] 9/16 off insulin gtt 9/20 To OR for Mitral Valve Repair w/ bioprosthetic valve  LINES/TUBES: Right IJ PICC 9/12 - 9/20 Rt femoral introducer 9/15 - 9/20 OETT 8.0 9/14 >>  HD cath left IJ 9/14 >> R IJ Introducer/CVL/Swan 9/20 >> IABP 9/15 >> Lt femoral a line 9/15 >>  Y Chest Tube/Mediastinal Chest Tube 9/20 >> PIV x1  ASSESSMENT / PLAN:  PULMONARY A: Acute Hypoxic Respiratory Failure - Secondary to pulmonary edema. Chest/Mediastinal Tubes  P:   Full vent support Chest tube Mgt per CVTS  CARDIOVASCULAR A:  Inferior/posterior MI. Severe MR w/ ruptured posterior papillary muscle Shock - Combination cardiogenic & septic.  P:  Continuous Telemetry Monitoring Vitals per unit protocol IABP, PA catheter per CVTS/cardiology Post-op Mgt per CVTS/Cardiology ASA, Lipitor, & Lopressor ordered Amiodarone, Epinephrine, Levophed, Nitro, & Primacor gtt's ordered  RENAL A:   Acute Renal Failure - Baseline creatinine 1.6-1.7. Hyponatremia - Mild.  P:   Nephrology Following Likely will need CRRT today for diuresis Trending electrolytes per Nephrology protocol  GASTROINTESTINAL A:   Transaminitis - Mild. Likely due to hepatic congestion. Mild Malnutrition - Related to critical illness.  P:   Pepcid IV q12hr NPO Trending LFTs  daily  HEMATOLOGIC A:   Anemia - Hgb worsening. No signs of active bleeding. Last transfusion 9/20. Leukocytosis - Multifactorial. Count stable.  P:  Trending cell counts w/ CBC SCDs Transfusing 2u PRBC & repeat Hgb/Hct post transfusion  INFECTIOUS A:   Group B Strep Pneumonia  P:   Day #6 of Rocephin & total Day #7 Plan to re-culture for fever Vancomycin Surgical Prophylaxis  ENDOCRINE A:   DM - Glucose controlled.  P:   Subcutaneous insulin per CVTS Levemir BID  NEUROLOGIC A:   Acute Encephalopathy - Multifactorial and likely combination toxic metabolic & hypoxia.  P:  RASS goal: -1 Precedex gtt Versed IV prn Sedation Fentanyl IV prn  TODAY'S SUMMARY:  60 y.o. female now s/p bioprosthetic mitral valve replacement with ongoing cardiogenic and septic shock. Continuing Rocephin for group B strep pneumonia. Postoperative management of balloon pump, vasopressors, and chest tube per CVTS and cardiology. Patient remains critically ill. Hopefully with diuresis via CVVHD we will be able to start weaning the patient's ventilator support.   I have spent a total of 31 minutes of critical care time today caring for the patient & reviewing the patient's electronic medical record.  Sonia Baller Ashok Cordia, M.D. [facility]monary [facility] Critical [facility] Pager:  [phone] After 3pm or if no response, call [phone] 08/25/2016, 7:27 AM

## 2016-08-25 NOTE — Progress Notes (Signed)
Cortrak tube placed in right nare to 84 cm. Pt on precedex and  tolerated placement  well. Xray ordered, note placed, and line added to assessment. Please contact cortrak team with any questions or concerns. Tube may be replaced if it becomes dislodged. Please save tube and stylet.

## 2016-08-25 NOTE — Progress Notes (Signed)
Patient ID: Deborah [name], female   DOB: September 09, 1956, 60 y.o.   MRN: [id number]   SICU Evening Rounds:   Hemodynamically stable. AV paced  on amio 30 and lido 1. CI = 2.1 Epi 3, levophed 30, milrinone 0.25. IABP removed today.  On vent  Anuric on CRRT CT output low  CBC    Component Value Date/Time   WBC 26.2 (H) 08/25/2016 0400   RBC 2.32 (L) 08/25/2016 0400   HGB 12.2 08/25/2016 1524   HCT 36.0 08/25/2016 1524   PLT 176 08/25/2016 0400   MCV 85.3 08/25/2016 0400   MCH 28.4 08/25/2016 0400   MCHC 33.3 08/25/2016 0400   RDW 15.1 08/25/2016 0400   LYMPHSABS 2.3 08/23/2016 0328   MONOABS 4.1 (H) 08/23/2016 0328   EOSABS 0.5 08/23/2016 0328   BASOSABS 0.0 08/23/2016 0328     BMET    Component Value Date/Time   NA 133 (L) 08/25/2016 1524   K 4.6 08/25/2016 1524   CL 101 08/25/2016 1524   CO2 21 (L) 08/25/2016 1515   GLUCOSE 168 (H) 08/25/2016 1524   BUN 25 (H) 08/25/2016 1524   CREATININE 2.60 (H) 08/25/2016 1524   CREATININE 1.41 (H) 10/04/2012 1631   CALCIUM 7.7 (L) 08/25/2016 1515   GFRNONAA 21 (L) 08/25/2016 1515   GFRAA 24 (L) 08/25/2016 1515     A/P:  Stable postop course. Continue current plans

## 2016-08-25 NOTE — Progress Notes (Addendum)
Patient ID: Deborah [name], female   DOB: 05-21-1956, 60 y.o.   MRN: [id number]   60 y/o woman with DM2, HTN, HL and noncompliance presented to [facility] ER with infrerior posterior STEMI on 9/12. Taken to cath lab and found to have occluded small to moderate OM-1 branch. Underwent successful PCS with stent x 2 by Dr. Clayborn Bigness. Coronaries otherwise ok. EF 55-60% with severe MR confirmed by echo.   Post-cath developed respiratory failure and required intubation. Developed shock and renal failure with peak creatinine 3.4 (1.6 on admit). Trialysis catheter placed.   Patient persistently acidotic with concern for ischemic MR and transferred here. En route develop severe hypotension and norepinephrine started.   Brought to cath lab 9/15 and TEE showed normal LV (EF 60-65%) and normal RV function with ruptured posterior papillary muscle with severe posterior MR.  Underwent placement of Swan and IABP.   S/P MVR 08/23/16.   Remains intubated. Now on pressure support. IABP pulled 9/22. Remains on epi 3 norepi 25 and milrinone 0.25. MAPs 70. PA pressures ok. CVVHD pulling 100/hr. Weight down significantly. About 10 pounds from pre-op weight now. Anuric. LFTs way up. co-ox 49%    Scheduled Meds: . acetaminophen  1,000 mg Oral Q6H   Or  . acetaminophen (TYLENOL) oral liquid 160 mg/5 mL  1,000 mg Per Tube Q6H  . aspirin EC  325 mg Oral Daily   Or  . aspirin  324 mg Per Tube Daily  . atorvastatin  20 mg Oral q1800  . bisacodyl  10 mg Oral Daily   Or  . bisacodyl  10 mg Rectal Daily  . cefTRIAXone (ROCEPHIN)  IV  2 g Intravenous Q24H  . chlorhexidine gluconate (MEDLINE KIT)  15 mL Mouth Rinse BID  . docusate sodium  200 mg Oral Daily  . famotidine (PEPCID) IV  20 mg Intravenous Q24H  . feeding supplement (NEPRO CARB STEADY)  237 mL Oral TID WC  . insulin aspart  0-24 Units Subcutaneous Q4H  . insulin detemir  10 Units Subcutaneous BID  . magnesium sulfate  4 g Intravenous Once  . mouth rinse  15 mL  Mouth Rinse 10 times per day  . metoCLOPramide (REGLAN) injection  10 mg Intravenous Q6H  . metoprolol tartrate  12.5 mg Oral BID   Or  . metoprolol tartrate  12.5 mg Per Tube BID  . sodium chloride flush  3 mL Intravenous Q12H   Continuous Infusions: . sodium chloride 10 mL/hr at 08/25/16 0400  . sodium chloride    . sodium chloride 10 mL/hr at 08/25/16 0400  . amiodarone 30 mg/hr (08/25/16 0700)  . dexmedetomidine 1 mcg/kg/hr (08/25/16 0700)  . epinephrine 3 mcg/min (08/25/16 0400)  . lactated ringers    . lactated ringers    . lidocaine 2 mg/min (08/25/16 0400)  . milrinone 0.25 mcg/kg/min (08/25/16 0400)  . nitroGLYCERIN    . norepinephrine (LEVOPHED) Adult infusion 24 mcg/min (08/25/16 0400)  . phenylephrine (NEO-SYNEPHRINE) Adult infusion 40 mcg/min (08/25/16 0400)  . dialysis replacement fluid (prismasate) 300 mL/hr at 08/25/16 0700  . dialysis replacement fluid (prismasate) 200 mL/hr at 08/24/16 1355  . dialysate (PRISMASATE) 1,000 mL/hr at 08/25/16 0547   PRN Meds:.sodium chloride, acetaminophen, bisacodyl, docusate, fentaNYL (SUBLIMAZE) injection, heparin, lactated ringers, metoprolol, midazolam, ondansetron (ZOFRAN) IV, ondansetron (ZOFRAN) IV, sodium chloride, sodium chloride flush, sodium chloride flush    Vitals:   08/25/16 0700 08/25/16 0900 08/25/16 0910 08/25/16 0918  BP: (!) 93/54 (!) 77/59  Pulse: 90 (!) 45 (!) 44 90  Resp: 15 16 (!) 24 20  Temp: (!) 96.6 F (35.9 C) (!) 96.6 F (35.9 C) (!) 96.4 F (35.8 C) (!) 96.4 F (35.8 C)  TempSrc:  Core (Comment)    SpO2: 100% 100% 100% 100%  Weight:      Height:        Intake/Output Summary (Last 24 hours) at 08/25/16 0948 Last data filed at 08/25/16 0700  Gross per 24 hour  Intake          3711.18 ml  Output             3641 ml  Net            70.18 ml    LABS: Basic Metabolic Panel:  Recent Labs  08/24/16 0433  08/24/16 1700 08/24/16 1715 08/25/16 0400  NA  --   < > 134* 134* 132*  132*    K  --   < > 4.6 4.5 4.9  4.9  CL  --   < > 103 103 99*  100*  CO2  --   < > 18*  --  21*  21*  GLUCOSE  --   < > 136* 131* 176*  176*  BUN  --   < > 35* 32* 29*  29*  CREATININE  --   < > 3.11* 3.30* 2.72*  2.60*  CALCIUM  --   < > 7.9*  --  7.8*  7.8*  MG 2.2  --   --   --  2.3  PHOS  --   < > 6.5*  --  5.2*  < > = values in this interval not displayed. Liver Function Tests:  Recent Labs  08/23/16 0328  08/24/16 1700 08/25/16 0400  AST 55*  --   --  320*  ALT 37  --   --  146*  ALKPHOS 113  --   --  127*  BILITOT 0.5  --   --  0.8  PROT 6.2*  --   --  5.1*  ALBUMIN 1.6*  1.6*  < > 2.1* 2.4*  2.4*  < > = values in this interval not displayed. No results for input(s): LIPASE, AMYLASE in the last 72 hours. CBC:  Recent Labs  08/23/16 0328  08/24/16 1700 08/24/16 1715 08/25/16 0400  WBC 22.5*  < > 26.7*  --  26.2*  NEUTROABS 15.6*  --   --   --   --   HGB 9.3*  < > 7.4* 7.5* 6.6*  HCT 28.4*  < > 21.8* 22.0* 19.8*  MCV 87.7  < > 85.8  --  85.3  PLT 351  < > 173  --  176  < > = values in this interval not displayed. Cardiac Enzymes: No results for input(s): CKTOTAL, CKMB, CKMBINDEX, TROPONINI in the last 72 hours. BNP: Invalid input(s): POCBNP D-Dimer: No results for input(s): DDIMER in the last 72 hours. Hemoglobin A1C: No results for input(s): HGBA1C in the last 72 hours. Fasting Lipid Panel: No results for input(s): CHOL, HDL, LDLCALC, TRIG, CHOLHDL, LDLDIRECT in the last 72 hours. Thyroid Function Tests: No results for input(s): TSH, T4TOTAL, T3FREE, THYROIDAB in the last 72 hours.  Invalid input(s): FREET3 Anemia Panel: No results for input(s): VITAMINB12, FOLATE, FERRITIN, TIBC, IRON, RETICCTPCT in the last 72 hours.  RADIOLOGY: Dg Chest 1 View  Result Date: 08/17/2016 CLINICAL DATA:  Endotracheal tube placement, OG tube placement EXAM: CHEST 1 VIEW  COMPARISON:  Portable chest x-ray of 08/17/2016 FINDINGS: The tip of the endotracheal tube is  within the right mainstem bronchus and needs to be withdrawn by approximately 5 cm per OG tube extends below the hemidiaphragm. Bilateral primarily perihilar airspace disease again is noted most consistent with edema or possibly aspiration. A small left pleural effusion is noted. Right central venous line is unchanged. IMPRESSION: 1. Endotracheal tip is within the right mainstem bronchus and needs to be withdrawn by approximately 5 cm. 2. OG tube extends below the hemidiaphragm. 3. Little change in bilateral airspace disease with small left pleural effusion. Electronically Signed   By: Ivar Drape M.D.   On: 08/17/2016 08:05   Dg Chest 1 View  Result Date: 08/17/2016 CLINICAL DATA:  Acute respiratory failure. EXAM: CHEST 1 VIEW COMPARISON:  08/15/2016 FINDINGS: Type of the right central line in the mid SVC. The bilateral perihilar opacities are becoming more confluent, air bronchograms non noted in the right suprahilar region. Stable cardiomegaly. Increasing left lung base atelectasis. Small pleural effusion on prior exam is not well visualized, suspect unchanged. No pneumothorax. IMPRESSION: Coalescing perihilar bilateral opacities, suspicious for increasing pulmonary edema. Increasing left lung base atelectasis. Electronically Signed   By: Jeb Levering M.D.   On: 08/17/2016 03:15   Dg Chest 1 View  Result Date: 08/15/2016 CLINICAL DATA:  Respiratory failure EXAM: CHEST 1 VIEW COMPARISON:  January 22, 2010 FINDINGS: The mediastinal contour is normal. The heart size is enlarged. The diffuse airspace opacities identified throughout bilateral lungs. There is probably a minimal left pleural effusion. The osseous structures are normal. IMPRESSION: Congestive heart failure. Electronically Signed   By: Abelardo Diesel M.D.   On: 08/15/2016 19:25   Dg Abd 1 View  Result Date: 08/17/2016 CLINICAL DATA:  Endotracheal tube placement, OG tube placement EXAM: ABDOMEN - 1 VIEW COMPARISON:  CT abdomen pelvis of  12/10/2010 FINDINGS: The OG tube tip extends into the body the stomach. The bowel gas pattern is nonspecific. Contrast is noted being excreted by the kidneys and within the urinary bladder secondary to recent CT of the abdomen pelvis. The bowel gas pattern is nonspecific. There may be a small left pleural effusion present. IMPRESSION: OG tube tip extends into the body the stomach. The bowel gas pattern is nonspecific. Electronically Signed   By: Ivar Drape M.D.   On: 08/17/2016 08:09   Dg Chest Port 1 View  Result Date: 08/25/2016 CLINICAL DATA:  Status post mitral valve replacement EXAM: PORTABLE CHEST 1 VIEW COMPARISON:  08/24/2016 FINDINGS: Cardiac shadow is stable. Intra-aortic balloon pump is again noted just below the aortic knob. Changes of mitral valve replacement are again seen. Swan-Ganz catheter is noted in the pulmonary outflow tract. Temporary dialysis catheter is noted via the left jugular approach. Right subclavian central line is noted as well. Endotracheal tube and mediastinal drain are again noted. The degree of vascular congestion has improved somewhat in the interval from the prior exam although persists. Bilateral pleural effusions right greater than left are noted. No focal confluent infiltrate is seen. IMPRESSION: Persistent but improved vascular congestion with bilateral effusions. Tubes and lines as described. Electronically Signed   By: Inez Catalina M.D.   On: 08/25/2016 08:40   Dg Chest Port 1 View  Result Date: 08/24/2016 CLINICAL DATA:  Mitral valve replacement, chest tube EXAM: PORTABLE CHEST 1 VIEW COMPARISON:  08/23/2016 FINDINGS: PA support devices including right chest tube, endotracheal tube Swan-Ganz catheter are unchanged. No pneumothorax. Diffuse interstitial prominence throughout  the lungs is increased, likely mild edema. Mild cardiomegaly. Left base atelectasis crash that left base atelectasis has increased. IMPRESSION: Increasing interstitial prominence throughout  the lungs, likely worsening interstitial edema. Increasing left base atelectasis. Electronically Signed   By: Rolm Baptise M.D.   On: 08/24/2016 08:41   Dg Chest Portable 1 View  Result Date: 08/23/2016 CLINICAL DATA:  Hypoxia EXAM: PORTABLE CHEST 1 VIEW COMPARISON:  Study obtained earlier in the day FINDINGS: Endotracheal tube tip is 2.6 cm above the carina. Central catheter placed from the right side has its tip in the right atrium slightly beyond the cavoatrial junction. Swan-Ganz catheter tip is in the proximal right main pulmonary artery. Left jugular catheter tip is in the superior vena cava. Nasogastric tube is no longer apparent. Temporary pacemaker wires are attached to the right heart. No pneumothorax. There is perihilar and central interstitial edema. No airspace consolidation. Heart is mildly enlarged. There is mild pulmonary venous hypertension. Prosthetic cardiac valve present. No adenopathy evident. IMPRESSION: Tube and catheter positions as described without pneumothorax. Note that the new catheter placed on the right side has its tip in the right atrium slightly beyond the cavoatrial junction. There is interstitial edema with mild cardiomegaly and mild pulmonary venous hypertension. No airspace consolidation appreciable. Electronically Signed   By: Lowella Grip III M.D.   On: 08/23/2016 15:34   Dg Chest Port 1 View  Result Date: 08/23/2016 CLINICAL DATA:  Respiratory failure. Acute on chronic diastolic heart failure. Cardiogenic shock. EXAM: PORTABLE CHEST 1 VIEW COMPARISON:  08/22/2016, 08/21/2016 and 08/20/2016 and 08/15/2016 FINDINGS: Endotracheal tube, NG tube and right and left jugular vein central catheters appear in good position, unchanged. Intra aortic balloon pump in place. Heart size and pulmonary vascularity are now normal. Small bilateral pleural effusions. Slight haziness at the lung bases is most likely secondary to the effusions although I think there is an element of  slight residual pulmonary edema as well. IMPRESSION: Improving pulmonary edema.  Persistent small bilateral effusions. Electronically Signed   By: Lorriane Shire M.D.   On: 08/23/2016 07:44   Dg Chest Port 1 View  Result Date: 08/22/2016 CLINICAL DATA:  Heart failure.  Balloon pump advanced. EXAM: PORTABLE CHEST 1 VIEW COMPARISON:  08/22/2016 at 4:50 a.m. FINDINGS: Metallic tip of the balloon pump projects over the aortic knob, well positioned. Endotracheal tube, right and left internal jugular central venous lines, femoral a placed Swan-Ganz catheter and nasal/orogastric tube are stable in well positioned. Lung aeration appears mildly improved although the difference may be technical only. There still vascular congestion and interstitial and hazy airspace opacity consistent with asymmetric pulmonary edema. Small pleural effusions are noted. IMPRESSION: 1. Intra-aortic balloon pump tip projects over the aortic knob, well positioned. 2. Possible mild improvement in lung aeration since the earlier study although the difference may be technical only. No other evidence of a change. Electronically Signed   By: Lajean Manes M.D.   On: 08/22/2016 16:34   Dg Chest Port 1 View  Result Date: 08/22/2016 CLINICAL DATA:  Respiratory failure, shortness of Breath EXAM: PORTABLE CHEST 1 VIEW COMPARISON:  08/21/2016 FINDINGS: Support devices are stable. Cardiomegaly. Bilateral perihilar and lower lobe opacities throughout the lungs have increased slightly since prior study, likely mild edema. Suspect small layering effusions. IMPRESSION: Mild interval worsening of pulmonary edema/ CHF. Small layering effusions. Electronically Signed   By: Rolm Baptise M.D.   On: 08/22/2016 07:30   Dg Chest Port 1 View  Result Date: 08/21/2016 CLINICAL  DATA:  Acute hypoxemic respiratory failure EXAM: PORTABLE CHEST 1 VIEW COMPARISON:  08/20/2016 FINDINGS: Support devices are stable. Endotracheal tube remains at the level of the carina.  Borderline heart size. Bilateral airspace opacities are again noted, likely edema. Findings stable or slightly worsened since prior study. Possible small right effusion. IMPRESSION: Slight interval worsening in pulmonary edema pattern. Small right effusion. Endotracheal tube remains at the level of the carina and could be retracted 2-3 cm for optimal positioning. Electronically Signed   By: Rolm Baptise M.D.   On: 08/21/2016 07:40   Dg Chest Port 1 View  Result Date: 08/20/2016 CLINICAL DATA:  Pt was hypotensive with respiratory failure x a few days ago. Hx of DM, and HTN. Pt had a cardiac cath on 08-15-16. EXAM: PORTABLE CHEST 1 VIEW COMPARISON:  08/19/2016 FINDINGS: Endotracheal to appears low at the level of the carina. LEFT and RIGHT central venous line are unchanged. Swan-Ganz catheter from an inferior approach unchanged. There is some improvement aeration lung bases. Mild pulmonary edema remains. No pneumothorax. IMPRESSION: 1. Endotracheal tube appears low at the level the carina. 2. Stable support apparatus. 3. Slight improved aeration lung bases with persistent mild edema. Electronically Signed   By: Suzy Bouchard M.D.   On: 08/20/2016 07:58   Dg Chest Port 1 View  Result Date: 08/19/2016 CLINICAL DATA:  Pulmonary edema EXAM: PORTABLE CHEST 1 VIEW COMPARISON:  08/18/2016 FINDINGS: External pad artifact. Endotracheal tube, central venous lines, and NG tube unchanged. Swan-Ganz catheter and intra aortic balloon pump unchanged. Stable cardiac silhouette. Mild central venous pulmonary congestion. No overt pulmonary edema. No pneumothorax. IMPRESSION: 1. Stable support apparatus. 2. Central venous congestion.  No significant change. Electronically Signed   By: Suzy Bouchard M.D.   On: 08/19/2016 07:55   Dg Chest Port 1 View  Result Date: 08/18/2016 CLINICAL DATA:  Placement of femoral Swan-Ganz catheter and intra-aortic balloon pump catheter. EXAM: PORTABLE CHEST 1 VIEW 4:14 p.m.: COMPARISON:   Portable chest x-ray earlier today 3:37 p.m. and previously. FINDINGS: Femoral Swan-Ganz catheter tip projects at the expected location of the right middle lobe pulmonary artery. Intra-aortic balloon pump catheter tip projects over the proximal descending thoracic aorta. Endotracheal tube tip now projects approximately 2-3 cm above the carina. Right jugular central venous catheter tip projects over the upper SVC. Left jugular central venous catheter tip projects over the lower SVC. Nasogastric tube courses below the diaphragm into the stomach. Improved aeration in the left lower lobe since the most recent examination 45 minutes earlier, though streaky opacities persist. Improved perihilar airspace pulmonary edema in both lungs. No new pulmonary parenchymal abnormalities. IMPRESSION: 1. Swan-Ganz catheter tip projects at the expected location of the right middle lobe pulmonary artery. 2. Intra-aortic balloon pump catheter tip projects over the proximal descending thoracic aorta. 3. Remaining support apparatus satisfactory. 4. Improved aeration in both lungs since the examination performed 45 minutes ago, although streaky left lower lobe atelectasis and/or pneumonia and mild perihilar airspace pulmonary edema persists. 5. No new abnormalities. Electronically Signed   By: Evangeline Dakin M.D.   On: 08/18/2016 16:43   Dg Chest Port 1 View  Result Date: 08/18/2016 CLINICAL DATA:  ST-elevation myocardial infarction and cardiogenic shock. EXAM: PORTABLE CHEST 1 VIEW COMPARISON:  Yesterday FINDINGS: Endotracheal tube tip 1 cm above the carina. Central line on the right and dialysis catheter on the left with tips at the lower SVC. Orogastric tube reaches the stomach at least. Stable borderline heart size. Worsening aeration at the  left base with newly obscured diaphragm. Perihilar airspace disease. IMPRESSION: 1. Lower endotracheal tube with tip 1 cm above the carina. 2. New atelectasis or consolidation in the left  lower lobe with small effusion. 3. Pulmonary edema. Electronically Signed   By: Monte Fantasia M.D.   On: 08/18/2016 07:02   Dg Chest Port 1 View  Result Date: 08/17/2016 CLINICAL DATA:  Central line placement EXAM: PORTABLE CHEST 1 VIEW COMPARISON:  Earlier same day FINDINGS: Endotracheal tube is been withdrawn with the tip 2.5 cm proximal to the carina. Right internal jugular central line remains in place with its tip in the SVC above the right atrium. New dual-lumen left internal jugular central line has its tip in the SVC above the right atrium. No pneumothorax. Bilateral bat wing airspace filling is seen consistent with acute edema or acute airspace filling due to other etiologies. No effusions. No collapse. IMPRESSION: Left IJ central line well position with tip in the SVC above the right atrium. No pneumothorax. Endotracheal tube repositioned, well positioned with tip 2.5 cm above the carina. Persistent worsen bat wing edema or airspace filling pattern. Electronically Signed   By: Nelson Chimes M.D.   On: 08/17/2016 10:35   Dg Chest Port 1 View  Result Date: 08/15/2016 CLINICAL DATA:  Central line placement EXAM: PORTABLE CHEST 1 VIEW COMPARISON:  August 15, 2016 7:04 p.m. FINDINGS: Right central venous line is identified with distal tip in superior vena cava. There is no pneumothorax. The heart size mildly enlarged. Diffuse airspace opacities identified throughout both lungs. The bones are unchanged. IMPRESSION: Right central venous line distal tip in the superior vena cava. There is no pneumothorax. Pulmonary edema unchanged compared prior exam. Electronically Signed   By: Abelardo Diesel M.D.   On: 08/15/2016 21:10   Dg Abd Portable 1v  Result Date: 08/22/2016 CLINICAL DATA:  Balloon pump position EXAM: PORTABLE ABDOMEN - 1 VIEW COMPARISON:  Chest x-ray 08/22/2016.  KUB 08/17/2016 FINDINGS: Right femoral Swan-Ganz catheter extends above the diaphragm . Intra-aortic balloon pump marker appears  overlying the L3 vertebral body in previously was in the thoracic aorta. Left femoral venous catheter in the left iliac vein. NG tube in the stomach. Normal bowel gas pattern. IMPRESSION: Intra-aortic balloon pump marker  overlies the L3 vertebral body. Electronically Signed   By: Franchot Gallo M.D.   On: 08/22/2016 13:12   Dg Abd Portable 1v  Result Date: 08/19/2016 CLINICAL DATA:  Acidosis EXAM: PORTABLE ABDOMEN - 1 VIEW COMPARISON:  08/17/2016 FINDINGS: No dilated loops large or small bowel. NG tube in stomach. Swan-Ganz catheter noted LEFT basilar atelectasis. The kidneys are dense suggesting retained IV contrast . IMPRESSION: 1. Normal bowel-gas pattern. 2. Retained contrast in the kidneys consistent consistent with acute kidney injury. Electronically Signed   By: Suzy Bouchard M.D.   On: 08/19/2016 08:02    PHYSICAL EXAM General: Intubated/sedated Neck: RIJ swan LIJ trialysis  Lungs: Clear anteriorly CV: Nondisplaced PMI.  Heart regular S1/S2, no S3/S4, 3/6 HSM apex.  2+ peripheral edema.   Abdomen: Soft, no hepatosplenomegaly, no distention.  Neurologic: Sedated.   Extremities: No clubbing or cyanosis. Right groin site w/o hematoma. GU: Foley    TELEMETRY: AV pacing with ectopy underlying rhythm junctional vs AF with frequent PVCs in bigeminal pattern   Assessment:   1. Cardiogenic shock: Due to acute severe MR.  2. CAD: Inferior posterior STEMI on 9/13 with DES x 2 to OM-1      --Plavix restarted on 9/22  3. 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     --s/p MVR 4. AKI on CKD stage III - due to ATN likely from cardiogenic shock + contrast with cath. Has required CVVH.  5. Acute respiratory failure: Pulmonary edema.  6. DM2 7. Suspect component of septic shock: Possible PNA, group B Strep in sputum culture.  8. Anemia 9. PVCs   10. Shock liver   Plan/Discussion:    She is critically ill  with cardiogenic shock in setting of inferior STEMI, severe ischemic MR due to ruptured papillary muscle and AKI. S/P MVR 08/23/16  Remains on triple pressors with good MAPs. PA pressures ok. IABP out. However co-ox down this morning and LFTs way up. Suspect probable shock liver. Though she is not acidotic. Amio and TFs stopped. RUQ u/s pending. Will repeat co-ox and titrate pressors as needed. Will repeat echo. Also need to consider transfusion reaction/infection - though rare.   Volume status improving. Continue CVVHD  Back on Plavix with recent stent.   Her rhythm is an issues with either underlying AF or junctional with frequent PVCs. Amio now on hold. Continue AV pacing and to supp electrolytes as needed.   The patient is critically ill with multiple organ systems failure and requires high complexity decision making for assessment and support, frequent evaluation and titration of therapies, application of advanced monitoring technologies and extensive interpretation of multiple databases.   Critical Care Time devoted to patient care services described in this note is 45 Minutes.  Bensimhon, Daniel,MD 2:37 PM

## 2016-08-25 NOTE — Progress Notes (Addendum)
Pre: pt BP 96/52, RN at bedside, site a level 0, AV paced rhythm in the 80s.   Post: 7.5 IABP sheath and balloon removed. R DP not palpable, auscultated with doppler prior to removal. Balloon and sheath removed, manual pressure held x 30 minutes. Post BP 94/52, paced rhythm in the 80s. Site level 0, verified by primary nurse Sula Soda RN. Doppler DP pulse present after removal.

## 2016-08-26 ENCOUNTER — Inpatient Hospital Stay (HOSPITAL_COMMUNITY): Payer: Medicaid Other

## 2016-08-26 DIAGNOSIS — R945 Abnormal results of liver function studies: Secondary | ICD-10-CM

## 2016-08-26 DIAGNOSIS — R7989 Other specified abnormal findings of blood chemistry: Secondary | ICD-10-CM

## 2016-08-26 DIAGNOSIS — Z952 Presence of prosthetic heart valve: Secondary | ICD-10-CM

## 2016-08-26 LAB — TYPE AND SCREEN
ABO/RH(D): AB POS
Antibody Screen: NEGATIVE
Unit division: 0
Unit division: 0
Unit division: 0
Unit division: 0
Unit division: 0
Unit division: 0
Unit division: 0

## 2016-08-26 LAB — POCT I-STAT 3, ART BLOOD GAS (G3+)
ACID-BASE DEFICIT: 1 mmol/L (ref 0.0–2.0)
Acid-base deficit: 3 mmol/L — ABNORMAL HIGH (ref 0.0–2.0)
BICARBONATE: 21.2 mmol/L (ref 20.0–28.0)
Bicarbonate: 23.2 mmol/L (ref 20.0–28.0)
O2 SAT: 95 %
O2 Saturation: 96 %
PCO2 ART: 31.8 mmHg — AB (ref 32.0–48.0)
PH ART: 7.419 (ref 7.350–7.450)
PO2 ART: 71 mmHg — AB (ref 83.0–108.0)
PO2 ART: 78 mmHg — AB (ref 83.0–108.0)
Patient temperature: 36.1
TCO2: 24 mmol/L (ref 0–100)
TCO2: 22 mmol/L (ref 0–100)
pCO2 arterial: 35.5 mmHg (ref 32.0–48.0)
pH, Arterial: 7.432 (ref 7.350–7.450)

## 2016-08-26 LAB — CBC
HCT: 26.8 % — ABNORMAL LOW (ref 36.0–46.0)
Hemoglobin: 9.2 g/dL — ABNORMAL LOW (ref 12.0–15.0)
MCH: 28.7 pg (ref 26.0–34.0)
MCHC: 34.3 g/dL (ref 30.0–36.0)
MCV: 83.5 fL (ref 78.0–100.0)
Platelets: 156 10*3/uL (ref 150–400)
RBC: 3.21 MIL/uL — ABNORMAL LOW (ref 3.87–5.11)
RDW: 15.2 % (ref 11.5–15.5)
WBC: 27.5 10*3/uL — ABNORMAL HIGH (ref 4.0–10.5)

## 2016-08-26 LAB — COMPREHENSIVE METABOLIC PANEL
ALT: 541 U/L — ABNORMAL HIGH (ref 14–54)
AST: 1011 U/L — ABNORMAL HIGH (ref 15–41)
Albumin: 2 g/dL — ABNORMAL LOW (ref 3.5–5.0)
Alkaline Phosphatase: 157 U/L — ABNORMAL HIGH (ref 38–126)
Anion gap: 11 (ref 5–15)
BUN: 19 mg/dL (ref 6–20)
CO2: 23 mmol/L (ref 22–32)
Calcium: 7.9 mg/dL — ABNORMAL LOW (ref 8.9–10.3)
Chloride: 100 mmol/L — ABNORMAL LOW (ref 101–111)
Creatinine, Ser: 1.99 mg/dL — ABNORMAL HIGH (ref 0.44–1.00)
GFR calc Af Amer: 30 mL/min — ABNORMAL LOW (ref >60)
GFR calc non Af Amer: 26 mL/min — ABNORMAL LOW (ref >60)
Glucose, Bld: 142 mg/dL — ABNORMAL HIGH (ref 65–99)
Potassium: 5.1 mmol/L (ref 3.5–5.1)
Sodium: 134 mmol/L — ABNORMAL LOW (ref 135–145)
Total Bilirubin: 0.8 mg/dL (ref 0.3–1.2)
Total Protein: 5.4 g/dL — ABNORMAL LOW (ref 6.5–8.1)

## 2016-08-26 LAB — GLUCOSE, CAPILLARY
GLUCOSE-CAPILLARY: 126 mg/dL — AB (ref 65–99)
GLUCOSE-CAPILLARY: 127 mg/dL — AB (ref 65–99)
GLUCOSE-CAPILLARY: 146 mg/dL — AB (ref 65–99)
Glucose-Capillary: 141 mg/dL — ABNORMAL HIGH (ref 65–99)
Glucose-Capillary: 130 mg/dL — ABNORMAL HIGH (ref 65–99)
Glucose-Capillary: 137 mg/dL — ABNORMAL HIGH (ref 65–99)

## 2016-08-26 LAB — RENAL FUNCTION PANEL
ALBUMIN: 2 g/dL — AB (ref 3.5–5.0)
ALBUMIN: 1.8 g/dL — AB (ref 3.5–5.0)
ANION GAP: 12 (ref 5–15)
ANION GAP: 7 (ref 5–15)
BUN: 19 mg/dL (ref 6–20)
BUN: 18 mg/dL (ref 6–20)
CALCIUM: 8 mg/dL — AB (ref 8.9–10.3)
CO2: 23 mmol/L (ref 22–32)
CO2: 22 mmol/L (ref 22–32)
Calcium: 7.4 mg/dL — ABNORMAL LOW (ref 8.9–10.3)
Chloride: 101 mmol/L (ref 101–111)
Chloride: 103 mmol/L (ref 101–111)
Creatinine, Ser: 1.94 mg/dL — ABNORMAL HIGH (ref 0.44–1.00)
Creatinine, Ser: 1.97 mg/dL — ABNORMAL HIGH (ref 0.44–1.00)
GFR calc Af Amer: 31 mL/min — ABNORMAL LOW (ref >60)
GFR, EST AFRICAN AMERICAN: 31 mL/min — AB (ref >60)
GFR, EST NON AFRICAN AMERICAN: 27 mL/min — AB (ref >60)
GFR, EST NON AFRICAN AMERICAN: 27 mL/min — AB (ref >60)
GLUCOSE: 140 mg/dL — AB (ref 65–99)
Glucose, Bld: 148 mg/dL — ABNORMAL HIGH (ref 65–99)
PHOSPHORUS: 3.7 mg/dL (ref 2.5–4.6)
PHOSPHORUS: 3.8 mg/dL (ref 2.5–4.6)
POTASSIUM: 5.6 mmol/L — AB (ref 3.5–5.1)
Potassium: 5.1 mmol/L (ref 3.5–5.1)
SODIUM: 136 mmol/L (ref 135–145)
Sodium: 132 mmol/L — ABNORMAL LOW (ref 135–145)

## 2016-08-26 LAB — POCT I-STAT, CHEM 8
BUN: 19 mg/dL (ref 6–20)
CHLORIDE: 99 mmol/L — AB (ref 101–111)
CREATININE: 2.2 mg/dL — AB (ref 0.44–1.00)
Calcium, Ion: 1.06 mmol/L — ABNORMAL LOW (ref 1.15–1.40)
GLUCOSE: 150 mg/dL — AB (ref 65–99)
HEMATOCRIT: 28 % — AB (ref 36.0–46.0)
Hemoglobin: 9.5 g/dL — ABNORMAL LOW (ref 12.0–15.0)
POTASSIUM: 5.5 mmol/L — AB (ref 3.5–5.1)
Sodium: 133 mmol/L — ABNORMAL LOW (ref 135–145)
TCO2: 21 mmol/L (ref 0–100)

## 2016-08-26 LAB — POCT ACTIVATED CLOTTING TIME
ACTIVATED CLOTTING TIME: 153 s
ACTIVATED CLOTTING TIME: 153 s
ACTIVATED CLOTTING TIME: 169 s
Activated Clotting Time: 153 seconds
Activated Clotting Time: 158 seconds

## 2016-08-26 LAB — CARBOXYHEMOGLOBIN
Carboxyhemoglobin: 0.7 % (ref 0.5–1.5)
Carboxyhemoglobin: 0.9 % (ref 0.5–1.5)
Carboxyhemoglobin: 1.1 % (ref 0.5–1.5)
METHEMOGLOBIN: 1 % (ref 0.0–1.5)
METHEMOGLOBIN: 1.2 % (ref 0.0–1.5)
Methemoglobin: 1 % (ref 0.0–1.5)
O2 SAT: 49.2 %
O2 SAT: 53.5 %
O2 Saturation: 96.7 %
TOTAL HEMOGLOBIN: 9.8 g/dL — AB (ref 12.0–16.0)
Total hemoglobin: 8.9 g/dL — ABNORMAL LOW (ref 12.0–16.0)
Total hemoglobin: 9.5 g/dL — ABNORMAL LOW (ref 12.0–16.0)

## 2016-08-26 LAB — LACTATE DEHYDROGENASE: LDH: 762 U/L — AB (ref 98–192)

## 2016-08-26 LAB — MAGNESIUM: Magnesium: 2.4 mg/dL (ref 1.7–2.4)

## 2016-08-26 MED ORDER — HEPARIN BOLUS VIA INFUSION (CRRT)
1000.0000 [IU] | INTRAVENOUS | Status: DC | PRN
  Filled 2016-08-26: qty 1000

## 2016-08-26 MED ORDER — DEXTROSE 5 % IV SOLN
2.0000 g | INTRAVENOUS | Status: AC
  Administered 2016-08-26: 2 g via INTRAVENOUS
  Filled 2016-08-26: qty 2

## 2016-08-26 MED ORDER — SODIUM CHLORIDE 0.9 % IJ SOLN
250.0000 [IU]/h | INTRAMUSCULAR | Status: DC
  Administered 2016-08-26: 950 [IU]/h via INTRAVENOUS_CENTRAL
  Administered 2016-08-26: 250 [IU]/h via INTRAVENOUS_CENTRAL
  Administered 2016-08-27: 1150 [IU]/h via INTRAVENOUS_CENTRAL
  Administered 2016-08-27: 1100 [IU]/h via INTRAVENOUS_CENTRAL
  Administered 2016-08-27: 1150 [IU]/h via INTRAVENOUS_CENTRAL
  Administered 2016-08-28 (×2): 1200 [IU]/h via INTRAVENOUS_CENTRAL
  Administered 2016-08-28: 1300 [IU]/h via INTRAVENOUS_CENTRAL
  Administered 2016-08-29: 1450 [IU]/h via INTRAVENOUS_CENTRAL
  Administered 2016-08-29: 1100 [IU]/h via INTRAVENOUS_CENTRAL
  Administered 2016-08-29: 1500 [IU]/h via INTRAVENOUS_CENTRAL
  Administered 2016-08-29: 1300 [IU]/h via INTRAVENOUS_CENTRAL
  Administered 2016-08-30: 1050 [IU]/h via INTRAVENOUS_CENTRAL
  Administered 2016-08-30: 1100 [IU]/h via INTRAVENOUS_CENTRAL
  Administered 2016-08-31: 1450 [IU]/h via INTRAVENOUS_CENTRAL
  Administered 2016-08-31: 1400 [IU]/h via INTRAVENOUS_CENTRAL
  Administered 2016-08-31: 1650 [IU]/h via INTRAVENOUS_CENTRAL
  Administered 2016-09-01: 1800 [IU]/h via INTRAVENOUS_CENTRAL
  Administered 2016-09-01: 1700 [IU]/h via INTRAVENOUS_CENTRAL
  Administered 2016-09-01 (×2): 1800 [IU]/h via INTRAVENOUS_CENTRAL
  Administered 2016-09-01: 1650 [IU]/h via INTRAVENOUS_CENTRAL
  Administered 2016-09-02: 1450 [IU]/h via INTRAVENOUS_CENTRAL
  Administered 2016-09-02: 1550 [IU]/h via INTRAVENOUS_CENTRAL
  Administered 2016-09-02: 1500 [IU]/h via INTRAVENOUS_CENTRAL
  Administered 2016-09-02: 1400 [IU]/h via INTRAVENOUS_CENTRAL
  Administered 2016-09-03 – 2016-09-04 (×7): 1350 [IU]/h via INTRAVENOUS_CENTRAL
  Administered 2016-09-05 (×2): 1450 [IU]/h via INTRAVENOUS_CENTRAL
  Administered 2016-09-05: 1350 [IU]/h via INTRAVENOUS_CENTRAL
  Administered 2016-09-05 – 2016-09-08 (×11): 1450 [IU]/h via INTRAVENOUS_CENTRAL
  Filled 2016-08-26 (×53): qty 2

## 2016-08-26 NOTE — Progress Notes (Signed)
Deborah [name] Progress Note    Subjective:    Remains on multiple pressors and inotropes CRRT goal neg 100/hour Clotted filter twice yesterday - increased pre filter fluids to 600 - filter pressures creeping up Dr. Prescott Gum said yest was OK with heparin if needed Now with shock liver IABP out   Objective:   BP (!) 89/61   Pulse (!) 56   Temp 97.5 F (36.4 C)   Resp 10   Ht 5' 1"  (1.549 m) Comment: from 08/15/16 encounter  Wt 76.3 kg (168 lb 3.4 oz)   SpO2 100%   BMI 31.78 kg/m   Intake/Output Summary (Last 24 hours) at 08/26/16 1055 Last data filed at 08/26/16 1000  Gross per 24 hour  Intake          4248.07 ml  Output             6138 ml  Net         -1889.93 ml   Weight trending 08/26/16  76.3   08/25/16  85.8   08/24/16  82 kg CRRT resumed  08/23/16  78 kg CRRT stopped for OR  08/22/16  79.3  kg   08/21/16 0500  78.7 kg  CRRT initiated  08/18/16 1457  72.9 kg      Physical Exam: Intubated, sedated, on IABP 1:1 BP (!) 89/61   Pulse (!) 56   Temp 97.5 F (36.4 C)   Resp 10   Ht 5' 1"  (1.549 m) Comment: from 08/15/16 encounter  Wt 76.3 kg (168 lb 3.4 oz)   SpO2 100%   BMI 31.78 kg/m   CVP 14 PCWP 22 PAP 27/19 (38/22) ETT, R triple lumen/SGC, left HD cath ([facility] 9/14 at [facility]), left fem art line, R fem IAPB, 2 chest tubes, R SCV line, foley Generalized edema Lungs ant clear Split S1S2 Paced 2 anterior chest tubes serosanguinous drainage Abd not tender 2+ bilat LE's and UE's  Long dressing RLE from vein harvest site/IABP in right leg  Imaging: Dg Chest Port 1 View  Result Date: 08/26/2016 CLINICAL DATA:  Status post cardiac surgery and valve replacement. EXAM: PORTABLE CHEST 1 VIEW COMPARISON:  08/25/2016 FINDINGS: Changes from the recent cardiac surgery and mitral valve replacement are stable from most recent prior exam. Cardiac silhouette is mildly enlarged. There is no mediastinal widening. Mild lower lung zone hazy opacity is noted  consistent with a combination of small pleural effusions and atelectasis. Mild interstitial thickening is noted centrally which has improved. There was no overt pulmonary edema. No pneumothorax. Endotracheal tube, right internal jugular Swan-Ganz catheter, left internal jugular dual-lumen central venous catheter, nasal/orogastric tube, right chest tube and mediastinal tube are stable in well positioned. Right subclavian central venous line catheter tip projects in the right atrium, also stable. IMPRESSION: 1. Improved lung aeration when compared the prior exam consistent with improved pulmonary edema. 2. Lung base opacity noted consistent with small effusions and atelectasis. 3. No pneumothorax.  No mediastinal widening. 4. Support apparatus is stable. Electronically Signed   By: Lajean Manes M.D.   On: 08/26/2016 08:03   Dg Chest Port 1 View  Result Date: 08/25/2016 CLINICAL DATA:  Status post mitral valve replacement EXAM: PORTABLE CHEST 1 VIEW COMPARISON:  08/24/2016 FINDINGS: Cardiac shadow is stable. Intra-aortic balloon pump is again noted just below the aortic knob. Changes of mitral valve replacement are again seen. Swan-Ganz catheter is noted in the pulmonary outflow tract. Temporary dialysis catheter is noted via the left  jugular approach. Right subclavian central line is noted as well. Endotracheal tube and mediastinal drain are again noted. The degree of vascular congestion has improved somewhat in the interval from the prior exam although persists. Bilateral pleural effusions right greater than left are noted. No focal confluent infiltrate is seen. IMPRESSION: Persistent but improved vascular congestion with bilateral effusions. Tubes and lines as described. Electronically Signed   By: Inez Catalina M.D.   On: 08/25/2016 08:40   Dg Abd Portable 1v  Result Date: 08/25/2016 CLINICAL DATA:  Feeding tube placement. EXAM: PORTABLE ABDOMEN - 1 VIEW COMPARISON:  08/22/2016. FINDINGS: Feeding tube noted  with its tip projected over the distal stomach/ proximal duodenum. NG tube noted in stable position with tip over the stomach. Left femoral catheter noted. Right femoral sheath in stable position . Aortic balloon pump tip noted L3. No bowel distention. IMPRESSION: Interim placement of feeding tube, its tip is projected over the distal stomach/ duodenum. Remaining lines and tubes including aortic balloon pump in stable position . Electronically Signed   By: Marcello Moores  Register   On: 08/25/2016 12:50    Labs:   Recent Labs Lab 08/22/16 1731 08/23/16 0328  08/24/16 0434 08/24/16 1700 08/24/16 1715 08/25/16 0400 08/25/16 1515 08/25/16 1524 08/26/16 0430 08/26/16 0500  NA 136 135  135  < > 136 134* 134* 132*  132* 133* 133* 134* 136  K 4.2 4.4  4.6  < > 4.4 4.6 4.5 4.9  4.9 4.7 4.6 5.1 5.1  CL 103 100*  102  < > 106 103 103 99*  100* 101 101 100* 101  CO2 22 24  23   --  20* 18*  --  21*  21* 21*  --  23 23  GLUCOSE 234* 186*  184*  < > 108* 136* 131* 176*  176* 171* 168* 142* 140*  BUN 40* 32*  32*  < > 36* 35* 32* 29*  29* 25* 25* 19 19  CREATININE 2.88* 2.33*  2.31*  < > 3.01* 3.11* 3.30* 2.72*  2.60* 2.41* 2.60* 1.99* 1.94*  CALCIUM 7.9* 7.7*  7.8*  --  8.0* 7.9*  --  7.8*  7.8* 7.7*  --  7.9* 8.0*  PHOS 2.9 3.5  --  6.1* 6.5*  --  5.2* 4.6  --   --  3.7  < > = values in this interval not displayed.   Recent Labs Lab 08/20/16 0500  08/21/16 0436  08/22/16 0315  08/23/16 0328  08/24/16 0433 08/24/16 1700  08/25/16 0400 08/25/16 1415 08/25/16 1524 08/26/16 0430  WBC 12.6*  < > 14.2*  < > 15.6*  < > 22.5*  < > 21.4* 26.7*  --  26.2*  --   --  27.5*  NEUTROABS 9.5*  --  10.4*  --  11.3*  --  15.6*  --   --   --   --   --   --   --   --   HGB 7.5*  < > 8.2*  < > 8.7*  < > 9.3*  < > 8.7* 7.4*  < > 6.6* 9.9* 12.2 9.2*  HCT 22.9*  < > 25.9*  < > 27.0*  < > 28.4*  < > 26.0* 21.8*  < > 19.8* 29.5* 36.0 26.8*  MCV 85.4  < > 87.8  < > 86.5  < > 87.7  < > 87.2 85.8  --   85.3  --   --  83.5  PLT 230  < >  235  < > 262  < > 351  < > 184 173  --  176  --   --  156  < > = values in this interval not displayed.   Hepatic Function Panel     Component Value Date/Time   PROT 5.4 (L) 08/26/2016 0430   ALBUMIN 2.0 (L) 08/26/2016 0500   AST 1,011 (H) 08/26/2016 0430   ALT 541 (H) 08/26/2016 0430   ALKPHOS 157 (H) 08/26/2016 0430   BILITOT 0.8 08/26/2016 0430   BILIDIR 0.1 08/15/2016 1338   IBILI 1.3 (H) 08/15/2016 1338   Medications:    . aspirin EC  325 mg Oral Daily   Or  . aspirin  324 mg Per Tube Daily  . bisacodyl  10 mg Oral Daily   Or  . bisacodyl  10 mg Rectal Daily  . cefTRIAXone (ROCEPHIN)  IV  2 g Intravenous Q24H  . chlorhexidine gluconate (MEDLINE KIT)  15 mL Mouth Rinse BID  . clopidogrel  75 mg Oral Daily  . docusate sodium  200 mg Oral Daily  . famotidine (PEPCID) IV  20 mg Intravenous Q24H  . insulin aspart  0-24 Units Subcutaneous Q4H  . insulin detemir  10 Units Subcutaneous BID  . mouth rinse  15 mL Mouth Rinse 10 times per day  . metoCLOPramide (REGLAN) injection  10 mg Intravenous Q6H  . metoprolol tartrate  12.5 mg Oral BID   Or  . metoprolol tartrate  12.5 mg Per Tube BID  . sodium chloride flush  3 mL Intravenous Q12H   Infusions:  . sodium chloride 10 mL/hr at 08/26/16 0400  . sodium chloride    . sodium chloride 10 mL/hr at 08/26/16 0400  . dexmedetomidine 1 mcg/kg/hr (08/26/16 1000)  . epinephrine 3 mcg/min (08/26/16 1000)  . feeding supplement (NEPRO CARB STEADY) Stopped (08/26/16 0800)  . lactated ringers    . lactated ringers    . lidocaine 1 mg/min (08/26/16 1000)  . milrinone 0.25 mcg/kg/min (08/26/16 1000)  . nitroGLYCERIN    . norepinephrine (LEVOPHED) Adult infusion 25 mcg/min (08/26/16 1000)  . phenylephrine (NEO-SYNEPHRINE) Adult infusion Stopped (08/25/16 1600)  . dialysis replacement fluid (prismasate) 600 mL/hr at 08/25/16 2223  . dialysis replacement fluid (prismasate) 200 mL/hr at 08/25/16 1630   . dialysate (PRISMASATE) 1,000 mL/hr at 08/26/16 0973   Background:  60 y/o woman with DM2, HTN, HL and noncompliance presented to [facility] ER with infrerior posterior STEMI on 9/12->cath->. PCS with stent x 2 by Dr. Clayborn Bigness. EF 55-60% with severe MR confirmed by echo.  Post-cath developed respiratory failure and required intubation. Developed shock and renal failure (creatinine 1.6 on adm). Trialysis catheter placed and pt had CRRT at [facility] for 48 hours prior to transfer here on 08/18/16->cath lab -> ruptured posterior papillary muscle with severe MR->IABP/Swan. We were asked to see d/t progressive rise in creatinine/volume overload/anuric AKI .  CRRT re-initiated 08/21/16-9/20, then 9/21 post MVR.    Assessment/ Plan:    Acute renal failure on CKD III:  Creatinine 1.6 on admission to [facility]  AKI from cardiogenic shock with evolution to ATN and contrast nephropathy after emergent coronary angiogram/intervention at [facility] with cardiogenic shock  Remains anuric Rec'd CRRT 9/18-9/20 in prep for OR and restarted 1 day post op on 9/21 600/200/1000/no heparin/all 4K Goal neg 100/hour, weight falling, filling pressures about the same, BP tolerating - continue Pre infusion rate increased last PM to 600 b/o clotting filters (no heparin)  Dr. Darcey Nora told  nurses OK with heparin - if clots again will start. 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  DM Per primary service    Deborah Maes, MD [place] Pager 08/26/2016, 10:55 AM   .

## 2016-08-26 NOTE — Progress Notes (Signed)
Patient ID: Deborah [name], female   DOB: 1955/12/24, 60 y.o.   MRN: [id number]  SICU Evening Rounds:   Hemodynamically stable on levophed 30, epi 3, milrinone 0.25  CI = 1.77 AV paced at 100 with stable rhythm.  Sedated on vent  Anuric on CRRT  CT output low  CBC    Component Value Date/Time   WBC 27.5 (H) 08/26/2016 0430   RBC 3.21 (L) 08/26/2016 0430   HGB 9.5 (L) 08/26/2016 1618   HCT 28.0 (L) 08/26/2016 1618   PLT 156 08/26/2016 0430   MCV 83.5 08/26/2016 0430   MCH 28.7 08/26/2016 0430   MCHC 34.3 08/26/2016 0430   RDW 15.2 08/26/2016 0430   LYMPHSABS 2.3 08/23/2016 0328   MONOABS 4.1 (H) 08/23/2016 0328   EOSABS 0.5 08/23/2016 0328   BASOSABS 0.0 08/23/2016 0328     BMET    Component Value Date/Time   NA 133 (L) 08/26/2016 1618   K 5.5 (H) 08/26/2016 1618   CL 99 (L) 08/26/2016 1618   CO2 22 08/26/2016 1600   GLUCOSE 150 (H) 08/26/2016 1618   BUN 19 08/26/2016 1618   CREATININE 2.20 (H) 08/26/2016 1618   CREATININE 1.41 (H) 10/04/2012 1631   CALCIUM 7.4 (L) 08/26/2016 1600   GFRNONAA 27 (L) 08/26/2016 1600   GFRAA 31 (L) 08/26/2016 1600     A/P:  She remains critically ill with high dose pressor dependent shock and multi-system organ failure. ABG this evening showed a normal pH with BE -3.  Will continue present support. Echo ordered. Hopefully can be done tomorrow.

## 2016-08-26 NOTE — Progress Notes (Signed)
3 Days Post-Op Procedure(s) (LRB): MITRAL VALVE REPAIR (MVR) USING 25MM EDWARDS MAGNA EASE BIOPROSTHESIS MITRAL  VALVE (N/A) INTRAOPERATIVE TRANSESOPHAGEAL ECHOCARDIOGRAM (N/A) ENDOVEIN HARVEST OF GREATER SAPHENOUS VEIN (Left) CENTRAL LINE INSERTION RIGHT SUBCLAVIAN (Right) Subjective: Intubated and sedated on vent.  Epi 3, milrinone 0.25, levophed 32. Last recorded CI 2.1 this am. Co0ox was 53.5 so levophed was increased from 25 mcg to 32 by Dr. Haroldine Laws.  PA 32/22 this evening  Minimal urine output on CRRT.   CT output low  Objective: Vital signs in last 24 hours: Temp:  [96.3 F (35.7 C)-99.1 F (37.3 C)] 98.1 F (36.7 C) (09/23 1614) Pulse Rate:  [44-112] 51 (09/23 1614) Cardiac Rhythm: Junctional rhythm (09/23 1600) Resp:  [10-38] 26 (09/23 1614) BP: (76-118)/(54-77) 101/54 (09/23 1600) SpO2:  [93 %-100 %] 100 % (09/23 1614) Arterial Line BP: (96-137)/(42-68) 125/58 (09/23 1600) FiO2 (%):  [50 %] 50 % (09/23 1614) Weight:  [76.3 kg (168 lb 3.4 oz)] 76.3 kg (168 lb 3.4 oz) (09/23 0500)  Hemodynamic parameters for last 24 hours: PAP: (24-44)/(8-26) 35/23 CO:  [3.3 L/min-3.7 L/min] 3.7 L/min CI:  [1.8 L/min/m2-2.1 L/min/m2] 2.1 L/min/m2  Intake/Output from previous day: 09/22 0701 - 09/23 0700 In: 4658.3 [I.V.:3297.5; Blood:696.7; NG/GT:564.2; IV Piggyback:100] Out: 3734 [Urine:10; Emesis/NG output:600; Chest Tube:40] Intake/Output this shift: Total I/O In: 973.4 [I.V.:873.4; NG/GT:70; IV Piggyback:30] Out: 2055 [Urine:5; Emesis/NG output:200; Other:1830; Chest Tube:20]  Heart: regular rate and rhythm, S1, S2 normal, no murmur, click, rub or gallop Lungs: clear to auscultation bilaterally Abdomen: soft, hypoactive BS. Extremities: edema moderate diffuse Wound: dressings dry.  Lab Results:  Recent Labs  08/25/16 0400  08/26/16 0430 08/26/16 1618  WBC 26.2*  --  27.5*  --   HGB 6.6*  < > 9.2* 9.5*  HCT 19.8*  < > 26.8* 28.0*  PLT 176  --  156  --   < > =  values in this interval not displayed. BMET:  Recent Labs  08/26/16 0500 08/26/16 1600 08/26/16 1618  NA 136 132* 133*  K 5.1 5.6* 5.5*  CL 101 103 99*  CO2 23 22  --   GLUCOSE 140* 148* 150*  BUN 19 18 19   CREATININE 1.94* 1.97* 2.20*  CALCIUM 8.0* 7.4*  --     PT/INR: No results for input(s): LABPROT, INR in the last 72 hours. ABG    Component Value Date/Time   PHART 7.432 08/26/2016 1615   HCO3 21.2 08/26/2016 1615   TCO2 21 08/26/2016 1618   ACIDBASEDEF 3.0 (H) 08/26/2016 1615   O2SAT 96.0 08/26/2016 1615   CBG (last 3)   Recent Labs  08/26/16 0819 08/26/16 1151 08/26/16 1552  GLUCAP 141* 126* 130*   CXR: ok  Assessment/Plan: S/P Procedure(s) (LRB): MITRAL VALVE REPAIR (MVR) USING 25MM EDWARDS MAGNA EASE BIOPROSTHESIS MITRAL  VALVE (N/A) INTRAOPERATIVE TRANSESOPHAGEAL ECHOCARDIOGRAM (N/A) ENDOVEIN HARVEST OF GREATER SAPHENOUS VEIN (Left) CENTRAL LINE INSERTION RIGHT SUBCLAVIAN (Right)  POD 3  1. CV: she has persistent shock requiring high dose vasopressors that is likely a combination of cardiogenic and septic shock. She had fever preop with Group B strep in sputum. It is not clear to me how good her LV/RV function was post-bypass but sounds like she had significant dysfunction preop from severe MR. Will continue pressor support. Her rhythm under pacer looks junctional and she has frequent bursts of NSVT, ventricular bigeminy. Lidocaine increased tonight without much effect. I am now AV pacing at 100 and this seems to be keeping a smooth  rhythm. Amio stopped this am due to elevated LFT's from shock liver.  2. Resp: Acute respiratory failure preop. I don't think she can be weaned and extubated while on high dose vasopressors. CCM following.  3.  Acute renal failue: continue CRRT but removing volume is limited by high dose pressor support.  4. Acute postop blood loss anemia: stable.  5. No anticoagulation for valve in her critical condition. Can review at a  later date.  6.  Keep chest tubes in for now.     LOS: 8 days    Gaye Pollack 08/26/2016

## 2016-08-26 NOTE — Progress Notes (Signed)
PULMONARY / CRITICAL CARE MEDICINE   Name: Deborah [name] MRN: [id number] DOB: October 02, 1956    ADMISSION DATE:  08/18/2016 CONSULTATION DATE:  08/18/2016  REFERRING MD:  Bensimhon   CHIEF COMPLAINT:  Ventilator management and critical care services   HISTORY OF PRESENT ILLNESS:   60 yo female admitted to [facility] 08/15/16 with STEMI s/p cath to [place].  Developed cardiogenic shock, VDRF (ETT 9/14), AKI with metabolic acidosis and started on CRRT 08/17/16.  Transferred to [facility] to tx shock and evaluated new severe MR from papillary muscle rupture.  She also had fever and found to have Group B Strep in sputum culture. Mitral valve replacement 9/20.  SUBJECTIVE: Net negative overnight, will wake up intermittently  REVIEW OF SYSTEMS:  Unable to obtain with patient on ventilator.  VITAL SIGNS: BP (!) 76/56   Pulse 89   Temp 98.1 F (36.7 C)   Resp 20   Ht 5\' 1"  (1.549 m) Comment: from 08/15/16 encounter  Wt 76.3 kg (168 lb 3.4 oz)   SpO2 97%   BMI 31.78 kg/m   HEMODYNAMICS: PAP: (24-44)/(8-25) 29/18 CO:  [3.3 L/min-3.6 L/min] 3.6 L/min CI:  [1.8 L/min/m2-2 L/min/m2] 2 L/min/m2  VENTILATOR SETTINGS: Vent Mode: PRVC FiO2 (%):  [50 %-60 %] 50 % Set Rate:  [16 bmp] 16 bmp Vt Set:  [400 mL] 400 mL PEEP:  [5 cmH20] 5 cmH20 Plateau Pressure:  [14 cmH20-22 cmH20] 20 cmH20  INTAKE / OUTPUT: I/O last 3 completed shifts: In: 6730.9 [I.V.:5030.1; Blood:696.7; NG/GT:654.2; IV Piggyback:350] Out: 8761 [Urine:10; Emesis/NG output:700; Other:8011; Chest Tube:40]  PHYSICAL EXAMINATION: General: Sedated on vent HENT: NCAT ETT in plac PULM: CTA with vent supported breaths CV: Paced rhythm, no clear murmur on my exam, mediastinal drains in place GI: minimal bowel sounds Derm: edema arms/legs Neuro: sedated on vent  LABS:  BMET  Recent Labs Lab 08/25/16 1515 08/25/16 1524 08/26/16 0430 08/26/16 0500  NA 133* 133* 134* 136  K 4.7 4.6 5.1 5.1  CL 101 101 100* 101  CO2 21*  --  23 23  BUN  25* 25* 19 19  CREATININE 2.41* 2.60* 1.99* 1.94*  GLUCOSE 171* 168* 142* 140*    Electrolytes  Recent Labs Lab 08/24/16 0433  08/25/16 0400 08/25/16 1515 08/26/16 0430 08/26/16 0500  CALCIUM  --   < > 7.8*  7.8* 7.7* 7.9* 8.0*  MG 2.2  --  2.3  --  2.4  --   PHOS  --   < > 5.2* 4.6  --  3.7  < > = values in this interval not displayed.  CBC  Recent Labs Lab 08/24/16 1700  08/25/16 0400 08/25/16 1415 08/25/16 1524 08/26/16 0430  WBC 26.7*  --  26.2*  --   --  27.5*  HGB 7.4*  < > 6.6* 9.9* 12.2 9.2*  HCT 21.8*  < > 19.8* 29.5* 36.0 26.8*  PLT 173  --  176  --   --  156  < > = values in this interval not displayed.  Coag's  Recent Labs Lab 08/23/16 1553  APTT 40*  INR 1.50    Sepsis Markers  Recent Labs Lab 08/19/16 1451 08/19/16 1706 08/20/16 0500 08/21/16 0436  LATICACIDVEN 1.4  --  1.3  --   PROCALCITON  --  5.44 5.24 4.50    ABG  Recent Labs Lab 08/25/16 0801 08/25/16 1523 08/26/16 0444  PHART 7.357 7.375 7.419  PCO2ART 37.5 38.2 35.5  PO2ART 102.0 65.0* 71.0*  Liver Enzymes  Recent Labs Lab 08/23/16 0328  08/25/16 0400 08/25/16 1515 08/26/16 0430 08/26/16 0500  AST 55*  --  320*  --  1,011*  --   ALT 37  --  146*  --  541*  --   ALKPHOS 113  --  127*  --  157*  --   BILITOT 0.5  --  0.8  --  0.8  --   ALBUMIN 1.6*  1.6*  < > 2.4*  2.4* 2.4* 2.0* 2.0*  < > = values in this interval not displayed.  Cardiac Enzymes No results for input(s): TROPONINI, PROBNP in the last 168 hours.  Glucose  Recent Labs Lab 08/25/16 0825 08/25/16 1225 08/25/16 1519 08/25/16 1943 08/25/16 2330 08/26/16 0354  GLUCAP 161* 142* 198* 163* 171* 146*    Imaging Dg Abd Portable 1v  Result Date: 08/25/2016 CLINICAL DATA:  Feeding tube placement. EXAM: PORTABLE ABDOMEN - 1 VIEW COMPARISON:  08/22/2016. FINDINGS: Feeding tube noted with its tip projected over the distal stomach/ proximal duodenum. NG tube noted in stable position with tip  over the stomach. Left femoral catheter noted. Right femoral sheath in stable position . Aortic balloon pump tip noted L3. No bowel distention. IMPRESSION: Interim placement of feeding tube, its tip is projected over the distal stomach/ duodenum. Remaining lines and tubes including aortic balloon pump in stable position . Electronically Signed   By: Marcello Moores  Register   On: 08/25/2016 12:50    STUDIES:  [place] 9/12: PCI to OM1, severe MR, EF 55 to 60% RHC 9/15: RA 5, RV 37/3/6, PA 38/18/26, PCWP 15, CI 1.9, PVR 3.3 WU TEE 9/15: EF 60 to 65%, flail motion MV with severe MR Port CXR 9/21:  Tubes in lines in good position. Hazy bilateral opacities. Port CXR 9/23: Lines, tubes, and endotracheal tube in good position. Persistent pulmonary vascular congestion and effusions  MICROBIOLOGY: MRSA PCR 9/12:  Negative MRSA PCR 9/15:  Negative Tracheal Asp Ctx 9/15:  Strep agalactiae  Blood Ctx x2 9/15:  Negative  MRSA PCR 9/19:  Negative   ANTIBIOTICS: Zosyn 9/16 - 9/17 Vancomycin 9/16 - 9/17 Rocephin 9/17 >> Vancomycin 9/20 (surgical prophylaxis)  SIGNIFICANT EVENTS: 9/12 Admit to [facility], PCI to [place] 9/14 ETT, CRRT 9/15 to [facility] 9/16 off insulin gtt 9/20 To OR for Mitral Valve Repair w/ bioprosthetic valve  LINES/TUBES: Right IJ PICC 9/12 - 9/20 Rt femoral introducer 9/15 - 9/20 OETT 8.0 9/14 >>  HD cath left IJ 9/14 >> R IJ Introducer/CVL/Swan 9/20 >> IABP 9/15 >>9/22 Lt femoral a line 9/15 >>  Y Chest Tube/Mediastinal Chest Tube 9/20 >> PIV x1  ASSESSMENT / PLAN:  PULMONARY A: Acute Hypoxic Respiratory Failure - Secondary to pulmonary edema > improving Chest/Mediastinal Tubes  P:   Pressure support today, consider extubation VAP prevention bundel Chest tube Mgt per CVTS  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 Shock - Combination cardiogenic & septic  P:  Continuous Telemetry Monitoring Vitals per unit protocol PA catheter  per CVTS/cardiology Post-op Mgt per CVTS/Cardiology ASA, & Lopressor ordered  Amiodarone, Epinephrine, Levophed, Nitro, & Primacor gtt's per cardiology/CVTS Consider stopping amiodarone with LFT rise Stop statin with LFT's rising  RENAL A:   Acute Renal Failure - Baseline creatinine 1.6-1.7 Hyponatremia - Mild  P:   Nephrology Following Continue volume removal per CVVHD Monitor BMET and UOP Replace electrolytes as needed  GASTROINTESTINAL A:   Transaminitis - worsening, pattern not consistent with congestion, consider  hemolysis vs drug effect Mild Malnutrition - Related to critical illness  P:   Pepcid IV q12hr NPO Trending LFTs daily RUQ ultrasound Stop atorvastatin  HEMATOLOGIC A:   Anemia - Hgb worsening. No signs of active bleeding. Last transfusion 9/20 Leukocytosis - Multifactorial. Count stable  P:  Trending cell counts w/ CBC SCDs Send LDH and haptoglobin  INFECTIOUS A:   Group B Strep Pneumonia  P:   Day #7 of Rocephin & total Day #7 Plan to re-culture for fever Vancomycin Surgical Prophylaxis  ENDOCRINE A:   DM - Glucose controlled.  P:   Subcutaneous insulin per CVTS Levemir BID  NEUROLOGIC A:   Acute Encephalopathy - Multifactorial and likely combination toxic metabolic & hypoxia.  P:   RASS goal: -1 Precedex gtt > consider stopping Versed IV prn Sedation Fentanyl IV prn   My cc time 32 minutes  Roselie Awkward, MD [facility] Pager: [phone] Cell: [phone] After 3pm or if no response, call [phone] 08/26/2016, 7:16 AM

## 2016-08-27 ENCOUNTER — Inpatient Hospital Stay (HOSPITAL_COMMUNITY): Payer: Medicaid Other

## 2016-08-27 DIAGNOSIS — I213 ST elevation (STEMI) myocardial infarction of unspecified site: Secondary | ICD-10-CM

## 2016-08-27 DIAGNOSIS — Z954 Presence of other heart-valve replacement: Secondary | ICD-10-CM

## 2016-08-27 LAB — BLOOD GAS, ARTERIAL
Acid-Base Excess: 0.5 mmol/L (ref 0.0–2.0)
Bicarbonate: 24.5 mmol/L (ref 20.0–28.0)
Drawn by: 437071
FIO2: 40
MECHVT: 400 mL
O2 Saturation: 97.3 %
PEEP: 5 cmH2O
Patient temperature: 95.6
RATE: 16 resp/min
pCO2 arterial: 35.8 mmHg (ref 32.0–48.0)
pH, Arterial: 7.44 (ref 7.350–7.450)
pO2, Arterial: 91.3 mmHg (ref 83.0–108.0)

## 2016-08-27 LAB — COMPREHENSIVE METABOLIC PANEL
ALT: 532 U/L — ABNORMAL HIGH (ref 14–54)
AST: 694 U/L — ABNORMAL HIGH (ref 15–41)
Albumin: 1.9 g/dL — ABNORMAL LOW (ref 3.5–5.0)
Alkaline Phosphatase: 193 U/L — ABNORMAL HIGH (ref 38–126)
Anion gap: 9 (ref 5–15)
BUN: 17 mg/dL (ref 6–20)
CO2: 23 mmol/L (ref 22–32)
Calcium: 7.6 mg/dL — ABNORMAL LOW (ref 8.9–10.3)
Chloride: 100 mmol/L — ABNORMAL LOW (ref 101–111)
Creatinine, Ser: 1.77 mg/dL — ABNORMAL HIGH (ref 0.44–1.00)
GFR calc Af Amer: 35 mL/min — ABNORMAL LOW (ref >60)
GFR calc non Af Amer: 30 mL/min — ABNORMAL LOW (ref >60)
Glucose, Bld: 141 mg/dL — ABNORMAL HIGH (ref 65–99)
Potassium: 4.9 mmol/L (ref 3.5–5.1)
Sodium: 132 mmol/L — ABNORMAL LOW (ref 135–145)
Total Bilirubin: 0.8 mg/dL (ref 0.3–1.2)
Total Protein: 5.6 g/dL — ABNORMAL LOW (ref 6.5–8.1)

## 2016-08-27 LAB — POCT I-STAT 3, ART BLOOD GAS (G3+)
ACID-BASE EXCESS: 1 mmol/L (ref 0.0–2.0)
BICARBONATE: 24.5 mmol/L (ref 20.0–28.0)
BICARBONATE: 24.3 mmol/L (ref 20.0–28.0)
O2 SAT: 97 %
O2 Saturation: 98 %
PCO2 ART: 36.8 mmHg (ref 32.0–48.0)
PH ART: 7.467 — AB (ref 7.350–7.450)
PH ART: 7.43 (ref 7.350–7.450)
PO2 ART: 86 mmHg (ref 83.0–108.0)
PO2 ART: 109 mmHg — AB (ref 83.0–108.0)
Patient temperature: 36.9
Patient temperature: 99.6
TCO2: 26 mmol/L (ref 0–100)
TCO2: 25 mmol/L (ref 0–100)
pCO2 arterial: 33.9 mmHg (ref 32.0–48.0)

## 2016-08-27 LAB — RENAL FUNCTION PANEL
Albumin: 1.9 g/dL — ABNORMAL LOW (ref 3.5–5.0)
Anion gap: 8 (ref 5–15)
BUN: 18 mg/dL (ref 6–20)
CALCIUM: 7.6 mg/dL — AB (ref 8.9–10.3)
CHLORIDE: 100 mmol/L — AB (ref 101–111)
CO2: 23 mmol/L (ref 22–32)
CREATININE: 1.65 mg/dL — AB (ref 0.44–1.00)
GFR calc Af Amer: 38 mL/min — ABNORMAL LOW (ref >60)
GFR calc non Af Amer: 33 mL/min — ABNORMAL LOW (ref >60)
GLUCOSE: 179 mg/dL — AB (ref 65–99)
Phosphorus: 2.4 mg/dL — ABNORMAL LOW (ref 2.5–4.6)
Potassium: 4.8 mmol/L (ref 3.5–5.1)
SODIUM: 131 mmol/L — AB (ref 135–145)

## 2016-08-27 LAB — POCT I-STAT, CHEM 8
BUN: 18 mg/dL (ref 6–20)
CHLORIDE: 98 mmol/L — AB (ref 101–111)
Calcium, Ion: 1.06 mmol/L — ABNORMAL LOW (ref 1.15–1.40)
Creatinine, Ser: 1.8 mg/dL — ABNORMAL HIGH (ref 0.44–1.00)
Glucose, Bld: 177 mg/dL — ABNORMAL HIGH (ref 65–99)
HEMATOCRIT: 28 % — AB (ref 36.0–46.0)
Hemoglobin: 9.5 g/dL — ABNORMAL LOW (ref 12.0–15.0)
POTASSIUM: 4.8 mmol/L (ref 3.5–5.1)
SODIUM: 134 mmol/L — AB (ref 135–145)
TCO2: 23 mmol/L (ref 0–100)

## 2016-08-27 LAB — CBC
HCT: 25.8 % — ABNORMAL LOW (ref 36.0–46.0)
Hemoglobin: 8.8 g/dL — ABNORMAL LOW (ref 12.0–15.0)
MCH: 28.9 pg (ref 26.0–34.0)
MCHC: 34.1 g/dL (ref 30.0–36.0)
MCV: 84.6 fL (ref 78.0–100.0)
Platelets: 148 10*3/uL — ABNORMAL LOW (ref 150–400)
RBC: 3.05 MIL/uL — ABNORMAL LOW (ref 3.87–5.11)
RDW: 15.2 % (ref 11.5–15.5)
WBC: 27.8 10*3/uL — ABNORMAL HIGH (ref 4.0–10.5)

## 2016-08-27 LAB — POCT ACTIVATED CLOTTING TIME
ACTIVATED CLOTTING TIME: 186 s
ACTIVATED CLOTTING TIME: 191 s
ACTIVATED CLOTTING TIME: 191 s
ACTIVATED CLOTTING TIME: 197 s
ACTIVATED CLOTTING TIME: 197 s
ACTIVATED CLOTTING TIME: 186 s
Activated Clotting Time: 180 seconds
Activated Clotting Time: 191 seconds
Activated Clotting Time: 197 seconds
Activated Clotting Time: 197 seconds
Activated Clotting Time: 208 seconds
Activated Clotting Time: 197 seconds

## 2016-08-27 LAB — CARBOXYHEMOGLOBIN
CARBOXYHEMOGLOBIN: 0.9 % (ref 0.5–1.5)
METHEMOGLOBIN: 1.2 % (ref 0.0–1.5)
O2 SAT: 51.2 %
Total hemoglobin: 7.6 g/dL — ABNORMAL LOW (ref 12.0–16.0)

## 2016-08-27 LAB — HEPATITIS PANEL, ACUTE
HCV AB: 0.1 {s_co_ratio} (ref 0.0–0.9)
HEP A IGM: NEGATIVE
HEP B C IGM: NEGATIVE
HEP B S AG: NEGATIVE

## 2016-08-27 LAB — AMMONIA: Ammonia: 27 umol/L (ref 9–35)

## 2016-08-27 LAB — GLUCOSE, CAPILLARY
GLUCOSE-CAPILLARY: 170 mg/dL — AB (ref 65–99)
Glucose-Capillary: 176 mg/dL — ABNORMAL HIGH (ref 65–99)
Glucose-Capillary: 143 mg/dL — ABNORMAL HIGH (ref 65–99)

## 2016-08-27 LAB — MAGNESIUM: Magnesium: 2.3 mg/dL (ref 1.7–2.4)

## 2016-08-27 LAB — APTT: APTT: 123 s — AB (ref 24–36)

## 2016-08-27 LAB — PHOSPHORUS: PHOSPHORUS: 2.9 mg/dL (ref 2.5–4.6)

## 2016-08-27 LAB — HAPTOGLOBIN: Haptoglobin: 134 mg/dL (ref 34–200)

## 2016-08-27 MED ORDER — NEPRO/CARBSTEADY PO LIQD
1000.0000 mL | ORAL | Status: DC
  Administered 2016-08-28 (×2): 1000 mL
  Filled 2016-08-27: qty 1000

## 2016-08-27 MED ORDER — MIDAZOLAM HCL 2 MG/2ML IJ SOLN
1.0000 mg | INTRAMUSCULAR | Status: DC | PRN
  Administered 2016-08-27 – 2016-08-30 (×12): 2 mg via INTRAVENOUS
  Administered 2016-08-30: 1 mg via INTRAVENOUS
  Filled 2016-08-27 (×12): qty 2

## 2016-08-27 MED ORDER — MIDAZOLAM HCL 2 MG/2ML IJ SOLN: 1.0000 mg | INTRAMUSCULAR | Status: DC | PRN

## 2016-08-27 NOTE — Progress Notes (Signed)
4 Days Post-Op Procedure(s) (LRB): MITRAL VALVE REPAIR (MVR) USING 25MM EDWARDS MAGNA EASE BIOPROSTHESIS MITRAL  VALVE (N/A) INTRAOPERATIVE TRANSESOPHAGEAL ECHOCARDIOGRAM (N/A) ENDOVEIN HARVEST OF GREATER SAPHENOUS VEIN (Left) CENTRAL LINE INSERTION RIGHT SUBCLAVIAN (Right) Subjective: Intubated and sedated on vent  Epi 3, milrinone 0.25, levophed 30 mcg CI 2.4 this am. Co-ox 51.2   Objective: Vital signs in last 24 hours: Temp:  [96.3 F (35.7 C)-99.1 F (37.3 C)] 98.4 F (36.9 C) (09/24 1117) Pulse Rate:  [44-112] 100 (09/24 1300) Cardiac Rhythm: A-V Sequential paced (09/24 1200) Resp:  [8-28] 15 (09/24 1300) BP: (76-120)/(54-79) 85/58 (09/24 1300) SpO2:  [96 %-100 %] 99 % (09/24 1300) Arterial Line BP: (98-165)/(41-83) 104/55 (09/24 1300) FiO2 (%):  [40 %-50 %] 40 % (09/24 1200) Weight:  [75 kg (165 lb 5.5 oz)] 75 kg (165 lb 5.5 oz) (09/24 0400)  Hemodynamic parameters for last 24 hours: PAP: (28-37)/(17-27) 32/17 CO:  [3 L/min-4.3 L/min] 4.3 L/min CI:  [1.7 L/min/m2-2.4 L/min/m2] 2.4 L/min/m2  Intake/Output from previous day: 09/23 0701 - 09/24 0700 In: 2830.3 [I.V.:2470.6; NG/GT:179.7; IV Piggyback:180] Out: 4829 [Urine:25; Emesis/NG output:500; Chest Tube:65] Intake/Output this shift: Total I/O In: 120 [I.V.:60; NG/GT:30; IV Piggyback:30] Out: 1292 [Emesis/NG output:100; DEYCX:4481]  Heart: regular rate and rhythm, S1, S2 normal, no murmur, click, rub or gallop Lungs: rhonchi bilaterally Abdomen: soft, few BS Extremities: moderate edema Wound: incisions ok  Lab Results:  Recent Labs  08/26/16 0430 08/26/16 1618 08/27/16 0321  WBC 27.5*  --  27.8*  HGB 9.2* 9.5* 8.8*  HCT 26.8* 28.0* 25.8*  PLT 156  --  148*   BMET:  Recent Labs  08/26/16 1600 08/26/16 1618 08/27/16 0321  NA 132* 133* 132*  K 5.6* 5.5* 4.9  CL 103 99* 100*  CO2 22  --  23  GLUCOSE 148* 150* 141*  BUN 18 19 17   CREATININE 1.97* 2.20* 1.77*  CALCIUM 7.4*  --  7.6*     PT/INR: No results for input(s): LABPROT, INR in the last 72 hours. ABG    Component Value Date/Time   PHART 7.467 (H) 08/27/2016 0822   HCO3 24.5 08/27/2016 0822   TCO2 26 08/27/2016 0822   ACIDBASEDEF 3.0 (H) 08/26/2016 1615   O2SAT 97.0 08/27/2016 0822   CBG (last 3)   Recent Labs  08/26/16 1935 08/26/16 2332 08/27/16 1143  GLUCAP 137* 127* 176*   CLINICAL DATA:  Post MITRAL VALVE REPAIR (MVR), INTRAOPERATIVE TRANSESOPHAGEAL ECHOCARDIOGRAM, ENDOVEIN HARVEST OF GREATER SAPHENOUS VEIN, CENTRAL LINE INSERTION on 08/23/2016. H/O diabetes, hypertension. Pt is intubated and on 24/7 dialysis. Non smoker  EXAM: PORTABLE CHEST 1 VIEW  COMPARISON:  08/26/2016  FINDINGS: right IJ Swan-Ganz catheter unchanged. There is also a left-sided internal jugular dialysis catheter which terminates at the mid SVC. Endotracheal tube terminates 3.3 cm above carina.Nasogastric and feeding tubes extend beyond the inferior aspect of the film. Mediastinal drain and right-sided chest tube. Cardiomegaly accentuated by AP portable technique. No pleural effusion or pneumothorax. Persistent mild to moderate interstitial edema. Bibasilar airspace disease. Right-sided subclavian line is also unchanged.  IMPRESSION: No significant change since the prior exam. Cardiomegaly with mild to moderate interstitial edema and bibasilar airspace disease.  Stable support apparatus, without pneumothorax.   Electronically Signed   By: [name] Miyamoto M.D.   On: 08/27/2016 09:06  Assessment/Plan: S/P Procedure(s) (LRB): MITRAL VALVE REPAIR (MVR) USING 25MM EDWARDS MAGNA EASE BIOPROSTHESIS MITRAL  VALVE (N/A) INTRAOPERATIVE TRANSESOPHAGEAL ECHOCARDIOGRAM (N/A) ENDOVEIN HARVEST OF GREATER SAPHENOUS VEIN (Left) CENTRAL LINE  INSERTION RIGHT SUBCLAVIAN (Right)  Hemodynamics are fairly stable but on high dose vasopressors. CI 2.4 this am but Co-ox marginal. No acidosis. Echo not officially read but I have  reviewed it and her LV function looks good, hyperdynamic with near complete LV obliteration with systole. RV looks ok. No significant pericardial effusion. This looks more like sepsis.  Swan, sleeve and foley removed today. Has central line and HD catheter in. Chest tube output minimal but will leave in today. Repeat cultures sent GB US did not show any signs of cholecystitis. On Rocephin  Remains anuric on CRRT removing volume.  LFT's improving  VDRF: not weanable in her current state.     LOS: 9 days    Gaye Pollack 08/27/2016

## 2016-08-27 NOTE — Progress Notes (Signed)
  Echocardiogram 2D Echocardiogram has been performed.  Deborah [name] 08/27/2016, 10:19 AM

## 2016-08-27 NOTE — Progress Notes (Signed)
[facility] Progress Note    Subjective:    Remains on multiple pressors and inotropes CRRT goal has been neg 100/hour Now getting heparin with CRRT d/t filter clotting Current Rx 600/200/1000 with effluent dose 25 ml/kg/hour Getting ECHO now  No UOP   Objective:   BP 94/73   Pulse 100   Temp 98.2 F (36.8 C)   Resp (!) 24   Ht _0  (1.549 m) Comment: from 08/15/16 encounter  Wt 75 kg (165 lb 5.5 oz)   SpO2 100%   BMI 31.24 kg/m   Intake/Output Summary (Last 24 hours) at 08/27/16 1000 Last data filed at 08/27/16 0900  Gross per 24 hour  Intake          2474.96 ml  Output             4143 ml  Net         -1668.04 ml   Weight trending 08/27/16 0400 75 kg  --  08/26/16 0500 76.3 kg   --  08/25/16 0500 85.8 kg (?)  --  08/24/16 0500 82 kg   --CRRT resumed  08/23/16 0234 78 kg  --CRRT stopped for OR  08/22/16 0900 79.9 kg  --  08/22/16 0346 80.5 kg   --  08/21/16 0500 78.7 kg   --CRRT inititated  08/20/16 0500 77.9 kg   --  08/19/16 0500 72.5 kg  --  08/18/16 1709 71.2 kg     08/18/16 1457 72.9 kg       Physical Exam: Intubated, sedated, on IABP 1:1 BP 94/73   Pulse 100   Temp 98.2 F (36.8 C)   Resp (!) 24   Ht _1  (1.549 m) Comment: from 08/15/16 encounter  Wt 75 kg (165 lb 5.5 oz)   SpO2 100%   BMI 31.24 kg/m   Hemodynamics (before SGC removed): CVP 14  PCWP 22 PAP 28/18 (27/19) (38/22) ETT, R triple lumen, mediastinal tubes Swan out Left IJ HD cath (placed 9/14 at [facility]) Generalized edema decreasing Lungs ant clear Split S1S2 Paced 2 anterior chest tubes serosanguinous drainage Abd not tender Still with pitting edema UE's/LE's  Long dressing RLE from vein harvest site  Imaging: Dg Chest Port 1 View  Result Date: 08/26/2016 CLINICAL DATA:  Status post cardiac surgery and valve replacement. EXAM: PORTABLE CHEST 1 VIEW COMPARISON:  08/25/2016 FINDINGS: Changes from the recent cardiac surgery and mitral valve replacement are  stable from most recent prior exam. Cardiac silhouette is mildly enlarged. There is no mediastinal widening. Mild lower lung zone hazy opacity is noted consistent with a combination of small pleural effusions and atelectasis. Mild interstitial thickening is noted centrally which has improved. There was no overt pulmonary edema. No pneumothorax. Endotracheal tube, right internal jugular Swan-Ganz catheter, left internal jugular dual-lumen central venous catheter, nasal/orogastric tube, right chest tube and mediastinal tube are stable in well positioned. Right subclavian central venous line catheter tip projects in the right atrium, also stable. IMPRESSION: 1. Improved lung aeration when compared the prior exam consistent with improved pulmonary edema. 2. Lung base opacity noted consistent with small effusions and atelectasis. 3. No pneumothorax.  No mediastinal widening. 4. Support apparatus is stable. Electronically Signed   By: Lajean Manes M.D.   On: 08/26/2016 08:03   Dg Chest Port 1v Same Day  Result Date: 08/27/2016 CLINICAL DATA:  Post MITRAL VALVE REPAIR (MVR), INTRAOPERATIVE TRANSESOPHAGEAL ECHOCARDIOGRAM, ENDOVEIN HARVEST OF GREATER SAPHENOUS VEIN, CENTRAL LINE INSERTION on 08/23/2016. H/O diabetes, hypertension.  Pt is intubated and on 24/7 dialysis. Non smoker EXAM: PORTABLE CHEST 1 VIEW COMPARISON:  08/26/2016 FINDINGS: right IJ Swan-Ganz catheter unchanged. There is also a left-sided internal jugular dialysis catheter which terminates at the mid SVC. Endotracheal tube terminates 3.3 cm above carina.Nasogastric and feeding tubes extend beyond the inferior aspect of the film. Mediastinal drain and right-sided chest tube. Cardiomegaly accentuated by AP portable technique. No pleural effusion or pneumothorax. Persistent mild to moderate interstitial edema. Bibasilar airspace disease. Right-sided subclavian line is also unchanged. IMPRESSION: No significant change since the prior exam. Cardiomegaly with  mild to moderate interstitial edema and bibasilar airspace disease. Stable support apparatus, without pneumothorax. Electronically Signed   By: [name] Miyamoto M.D.   On: 08/27/2016 09:06   Dg Abd Portable 1v  Result Date: 08/25/2016 CLINICAL DATA:  Feeding tube placement. EXAM: PORTABLE ABDOMEN - 1 VIEW COMPARISON:  08/22/2016. FINDINGS: Feeding tube noted with its tip projected over the distal stomach/ proximal duodenum. NG tube noted in stable position with tip over the stomach. Left femoral catheter noted. Right femoral sheath in stable position . Aortic balloon pump tip noted L3. No bowel distention. IMPRESSION: Interim placement of feeding tube, its tip is projected over the distal stomach/ duodenum. Remaining lines and tubes including aortic balloon pump in stable position . Electronically Signed   By: Marcello Moores  Register   On: 08/25/2016 12:50   US Abdomen Limited Ruq  Result Date: 08/26/2016 CLINICAL DATA:  Acute onset of abnormal LFTs.  Initial encounter. EXAM: US ABDOMEN LIMITED - RIGHT UPPER QUADRANT COMPARISON:  None. FINDINGS: Gallbladder: Gallbladder wall thickening is noted, measuring up to 5 mm. Vague internal echoes are seen within the gallbladder, suggestive of underlying sludge. No definite stones are identified. No pericholecystic fluid is seen. Evaluation for ultrasonographic Murphy's sign is not possible, as the patient is sedated and on ventilation. Common bile duct: Diameter: 0.5 cm, within normal limits in caliber. Liver: No focal lesion identified. Within normal limits in parenchymal echogenicity. The left hepatic lobe is not assessed as the patient has overlying bandages, due to recent CABG and mitral valve replacement. A small right-sided pleural effusion is noted. IMPRESSION: 1. Gallbladder wall thickening noted. Suggestion of underlying sludge filling the gallbladder. No definite evidence of cholelithiasis. Findings may reflect mild chronic inflammation. No pericholecystic fluid is  seen to suggest acute cholecystitis. 2. Small right-sided pleural effusion noted. 3. Liver grossly unremarkable in appearance, though the left hepatic lobe is not imaged on this study. Electronically Signed   By: Garald Balding M.D.   On: 08/26/2016 82:70   Basic Metabolic Panel:  Recent Labs  08/26/16 0430  08/26/16 1600 08/26/16 1618 08/27/16 0321  NA 134*  < > 132* 133* 132*  K 5.1  < > 5.6* 5.5* 4.9  CL 100*  < > 103 99* 100*  CO2 23  < > 22  --  23  GLUCOSE 142*  < > 148* 150* 141*  BUN 19  < > _0 CREATININE 1.99*  < > 1.97* 2.20* 1.77*  CALCIUM 7.9*  < > 7.4*  --  7.6*  MG 2.4  --   --   --  2.3  PHOS  --   < > 3.8  --  2.9  < > = values in this interval not displayed.   Recent Labs  08/26/16 0430  08/26/16 1600 08/27/16 0321  AST 1,011*  --   --  694*  ALT 541*  --   --  532*  ALKPHOS 157*  --   --  193*  BILITOT 0.8  --   --  0.8  PROT 5.4*  --   --  5.6*  ALBUMIN 2.0*  < > 1.8* 1.9*  < > = values in this interval not displayed.   Recent Labs  08/26/16 0430 08/26/16 1618 08/27/16 0321  WBC 27.5*  --  27.8*  HGB 9.2* 9.5* 8.8*  HCT 26.8* 28.0* 25.8*  MCV 83.5  --  84.6  PLT 156  --  148*       Component Value Date/Time   PROT 5.6 (L) 08/27/2016 0321   ALBUMIN 1.9 (L) 08/27/2016 0321   AST 694 (H) 08/27/2016 0321   ALT 532 (H) 08/27/2016 0321   ALKPHOS 193 (H) 08/27/2016 0321   BILITOT 0.8 08/27/2016 0321   BILIDIR 0.1 08/15/2016 1338   IBILI 1.3 (H) 08/15/2016 1338   Medications:    . aspirin EC  325 mg Oral Daily   Or  . aspirin  324 mg Per Tube Daily  . chlorhexidine gluconate (MEDLINE KIT)  15 mL Mouth Rinse BID  . clopidogrel  75 mg Oral Daily  . docusate sodium  200 mg Oral Daily  . famotidine (PEPCID) IV  20 mg Intravenous Q24H  . insulin aspart  0-24 Units Subcutaneous Q4H  . insulin detemir  10 Units Subcutaneous BID  . mouth rinse  15 mL Mouth Rinse 10 times per day  . sodium chloride flush  3 mL Intravenous Q12H    Infusions:  . sodium chloride 10 mL/hr at 08/27/16 0700  . sodium chloride    . sodium chloride 10 mL/hr at 08/27/16 0700  . dexmedetomidine 0.7 mcg/kg/hr (08/27/16 0700)  . epinephrine 3 mcg/min (08/27/16 0700)  . feeding supplement (NEPRO CARB STEADY) 1,000 mL (08/27/16 0700)  . heparin 10,000 units/ 20 mL infusion syringe 1,150 Units/hr (08/27/16 0854)  . lactated ringers    . lactated ringers    . lidocaine 2 mg/min (08/27/16 0700)  . milrinone 0.252 mcg/kg/min (08/27/16 0700)  . norepinephrine (LEVOPHED) Adult infusion 29.973 mcg/min (08/27/16 0700)  . dialysis replacement fluid (prismasate) 600 mL/hr at 08/27/16 0851  . dialysis replacement fluid (prismasate) 200 mL/hr at 08/26/16 2029  . dialysate (PRISMASATE) 1,000 mL/hr at 08/27/16 3664   Background:  59 y/o AAF with DM2, HTN, HLD presented to [facility] ER with infposterior STEMI on 9/12->cath->. PCS with stent x 2 by Dr. Clayborn Bigness. EF 55-60% with severe MR.  Post-cath respiratory failure ->intubation. Shock and renal failure (creatinine 1.6 on adm). Trialysis catheter placed 9/14 and pt had CRRT at [facility] for 48 hours prior to transfer here on 08/18/16->backto cath lab -> ruptured posterior papillary muscle with severe MR->IABP/Swan. We were asked to see d/t progressive rise in creatinine/volume overload/anuric AKI .  CRRT re-initiated 08/21/16-9/20, then 9/21 post MVR. Dialysis dependent since 08/17/16   Assessment/ Plan:    Acute renal failure on CKD III:  (Baseline creatinine 1.6 (08/15/16) AKI from cardiogenic shock/ATN/contrast nephropathy (emergent coronary angiogram/intervention)/severe MR  Anuric Dialysis dependent since 08/17/16 (CRRT at [facility]) Current Rx is 600/200/1000/heparin/all 4K fluids Goal neg 100/hour, weight falling, filling pressures better K better (lower) and phos fine today Still on EPI and norepi Pre filter rate increased to 600 b/o clotting filters - didn't help ->heparinstarted 9/23 - no clinical  bleeding Still about 3 kg over weight at time of transfer here  Continue same prescription for now Current HD catheter will have been in 10 days  tomorrow - will need to be changed out   Cardiogenic/septic shock status post acute inferopost STEMI/posterior papillary muscle/Severe MR: S/P PCI with 2 stents to OM1 branch [facility]) 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 May need trach  DM Per primary service  GroupB strep PNA  S/p course of Rocephin Still w/marked leukocytosis   Jamal Maes, MD [place] Pager 08/27/2016, 10:00 AM   .

## 2016-08-27 NOTE — Progress Notes (Signed)
PULMONARY / CRITICAL CARE MEDICINE   Name: Deborah [name] MRN: [id number] DOB: 1956/04/12    ADMISSION DATE:  08/18/2016 CONSULTATION DATE:  08/18/2016  REFERRING MD:  Bensimhon   CHIEF COMPLAINT:  Ventilator management and critical care services   HISTORY OF PRESENT ILLNESS:   60 yo female admitted to [facility] 08/15/16 with STEMI s/p cath to [place].  Developed cardiogenic shock, VDRF (ETT 9/14), AKI with metabolic acidosis and started on CRRT 08/17/16.  Transferred to [facility] to tx shock and evaluated new severe MR from papillary muscle rupture.  She also had fever and found to have Group B Strep in sputum culture. Mitral valve replacement 9/20.  SUBJECTIVE:  Failed SBT after a few hours Apneic on SBT this morning WBC remains elevated, afebrile  VITAL SIGNS: BP 94/73   Pulse 100   Temp 98.2 F (36.8 C)   Resp (!) 24   Ht 5\' 1"  (1.549 m) Comment: from 08/15/16 encounter  Wt 75 kg (165 lb 5.5 oz)   SpO2 100%   BMI 31.24 kg/m   HEMODYNAMICS: PAP: (27-37)/(17-27) 28/18 CO:  [3 L/min-3.7 L/min] 3.7 L/min CI:  [1.7 L/min/m2-2.1 L/min/m2] 2.1 L/min/m2  VENTILATOR SETTINGS: Vent Mode: PRVC FiO2 (%):  [40 %-50 %] 40 % Set Rate:  [16 bmp] 16 bmp Vt Set:  [400 mL] 400 mL PEEP:  [5 cmH20] 5 cmH20 Pressure Support:  [10 cmH20] 10 cmH20 Plateau Pressure:  [18 cmH20-19 cmH20] 19 cmH20  INTAKE / OUTPUT: I/O last 3 completed shifts: In: 4865.3 [I.V.:4105.6; NG/GT:479.7; IV Piggyback:280] Out: 4403 [Urine:20; Emesis/NG output:800; KVQQV:9563; Chest Tube:95]  PHYSICAL EXAMINATION: General: Sedated on vent HENT: NCAT ETT in place PULM: CTA with vent supported breaths CV: Paced rhythm, slight systolic murmur, mediastinal drains in place GI: minimal bowel sounds Derm: edema arms/legs Neuro: sedated on vent  LABS:  BMET  Recent Labs Lab 08/26/16 0500 08/26/16 1600 08/26/16 1618 08/27/16 0321  NA 136 132* 133* 132*  K 5.1 5.6* 5.5* 4.9  CL 101 103 99* 100*  CO2 23 22  --  23  BUN 19  18 19 17   CREATININE 1.94* 1.97* 2.20* 1.77*  GLUCOSE 140* 148* 150* 141*    Electrolytes  Recent Labs Lab 08/25/16 0400  08/26/16 0430 08/26/16 0500 08/26/16 1600 08/27/16 0321  CALCIUM 7.8*  7.8*  < > 7.9* 8.0* 7.4* 7.6*  MG 2.3  --  2.4  --   --  2.3  PHOS 5.2*  < >  --  3.7 3.8 2.9  < > = values in this interval not displayed.  CBC  Recent Labs Lab 08/25/16 0400  08/26/16 0430 08/26/16 1618 08/27/16 0321  WBC 26.2*  --  27.5*  --  27.8*  HGB 6.6*  < > 9.2* 9.5* 8.8*  HCT 19.8*  < > 26.8* 28.0* 25.8*  PLT 176  --  156  --  148*  < > = values in this interval not displayed.  Coag's  Recent Labs Lab 08/23/16 1553 08/27/16 0321  APTT 40* 123*  INR 1.50  --     Sepsis Markers  Recent Labs Lab 08/21/16 0436  PROCALCITON 4.50    ABG  Recent Labs Lab 08/26/16 0444 08/26/16 1615 08/27/16 0310  PHART 7.419 7.432 7.440  PCO2ART 35.5 31.8* 35.8  PO2ART 71.0* 78.0* 91.3    Liver Enzymes  Recent Labs Lab 08/25/16 0400  08/26/16 0430 08/26/16 0500 08/26/16 1600 08/27/16 0321  AST 320*  --  1,011*  --   --  694*  ALT 146*  --  541*  --   --  532*  ALKPHOS 127*  --  157*  --   --  193*  BILITOT 0.8  --  0.8  --   --  0.8  ALBUMIN 2.4*  2.4*  < > 2.0* 2.0* 1.8* 1.9*  < > = values in this interval not displayed.  Cardiac Enzymes No results for input(s): TROPONINI, PROBNP in the last 168 hours.  Glucose  Recent Labs Lab 08/26/16 0354 08/26/16 0819 08/26/16 1151 08/26/16 1552 08/26/16 1935 08/26/16 2332  GLUCAP 146* 141* 126* 130* 137* 127*    Imaging US Abdomen Limited Ruq  Result Date: 08/26/2016 CLINICAL DATA:  Acute onset of abnormal LFTs.  Initial encounter. EXAM: US ABDOMEN LIMITED - RIGHT UPPER QUADRANT COMPARISON:  None. FINDINGS: Gallbladder: Gallbladder wall thickening is noted, measuring up to 5 mm. Vague internal echoes are seen within the gallbladder, suggestive of underlying sludge. No definite stones are identified. No  pericholecystic fluid is seen. Evaluation for ultrasonographic Murphy's sign is not possible, as the patient is sedated and on ventilation. Common bile duct: Diameter: 0.5 cm, within normal limits in caliber. Liver: No focal lesion identified. Within normal limits in parenchymal echogenicity. The left hepatic lobe is not assessed as the patient has overlying bandages, due to recent CABG and mitral valve replacement. A small right-sided pleural effusion is noted. IMPRESSION: 1. Gallbladder wall thickening noted. Suggestion of underlying sludge filling the gallbladder. No definite evidence of cholelithiasis. Findings may reflect mild chronic inflammation. No pericholecystic fluid is seen to suggest acute cholecystitis. 2. Small right-sided pleural effusion noted. 3. Liver grossly unremarkable in appearance, though the left hepatic lobe is not imaged on this study. Electronically Signed   By: Garald Balding M.D.   On: 08/26/2016 18:29    STUDIES:  LHC 9/12: PCI to OM1, severe MR, EF 55 to 60% RHC 9/15: RA 5, RV 37/3/6, PA 38/18/26, PCWP 15, CI 1.9, PVR 3.3 WU TEE 9/15: EF 60 to 65%, flail motion MV with severe MR Port CXR 9/21:  Tubes in lines in good position. Hazy bilateral opacities. Port CXR 9/23: Lines, tubes, and endotracheal tube in good position. Persistent pulmonary vascular congestion and effusions RUQ ultrasound 9/23 > Gallbladder wall thickening noted, suggestion of sludge, small R effusion, liver unremarkable but left lobe not well visulalized  MICROBIOLOGY: MRSA PCR 9/12:  Negative MRSA PCR 9/15:  Negative Tracheal Asp Ctx 9/15:  Strep agalactiae  Blood Ctx x2 9/15:  Negative  MRSA PCR 9/19:  Negative   ANTIBIOTICS: Zosyn 9/16 - 9/17 Vancomycin 9/16 - 9/17 Rocephin 9/17 >> 9/24 Vancomycin 9/20 (surgical prophylaxis)  SIGNIFICANT EVENTS: 9/12 Admit to [facility], PCI to [place] 9/14 ETT, CRRT 9/15 to [facility] 9/16 off insulin gtt 9/20 To OR for Mitral Valve Repair w/ bioprosthetic  valve  LINES/TUBES: Right IJ PICC 9/12 - 9/20 Rt femoral introducer 9/15 - 9/20 OETT 8.0 9/14 >>  HD cath left IJ 9/14 >> R IJ Introducer/CVL/Swan 9/20 >> IABP 9/15 >>9/22 Lt femoral a line 9/15 >>  Y Chest Tube/Mediastinal Chest Tube 9/20 >> PIV x1  ASSESSMENT / PLAN:  PULMONARY A: Acute Hypoxic Respiratory Failure - Secondary to pulmonary edema > failed SBT 9/24 Chest/Mediastinal Tubes Abnormal respiratory pattern 9/24 P:   Pressure support today, consider extubation VAP prevention bundle Chest tube Mgt per CVTS May need trach Check ammonia  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 Shock - Combination cardiogenic &  septic  P:  Continuous Telemetry Monitoring Vitals per unit protocol PA catheter per CVTS/cardiology Post-op Mgt per CVTS/Cardiology ASA, & Lopressor ordered  Epinephrine, Levophed, Nitro, & Primacor gtt's per cardiology/CVTS   RENAL A:   Acute Renal Failure - Baseline creatinine 1.6-1.7 Hyponatremia - Mild  P:   Nephrology Following Continue volume removal per CVVHD > consider HD cath change 9/25 (permcath?) Monitor BMET and UOP Replace electrolytes as needed  GASTROINTESTINAL A:   Transaminitis - improving, amio or statin effect? Mild Malnutrition - Related to critical illness  P:   Pepcid IV q12hr NPO Trend LFTs daily Amiodarone and Statins on hold due to LFT abnormality Check ammonia  HEMATOLOGIC A:   Anemia - Hgb worsening. No signs of active bleeding. Last transfusion 9/20 Leukocytosis - Multifactorial.  P:  Trending cell counts w/ CBC SCDs  INFECTIOUS A:   Group B Strep Pneumonia Leukocytosis P:   Stop rocephin Consider changing lines 9/25 if leukocytosis persists Plan to re-culture for fever  ENDOCRINE A:   DM - Glucose controlled  P:   Subcutaneous insulin per CVTS Levemir BID  NEUROLOGIC A:   Acute Encephalopathy - Multifactorial and likely combination  toxic metabolic & hypoxia  P:   RASS goal: -1 Precedex gtt > consider stopping Versed IV prn Sedation > lower dose Fentanyl IV prn Check ammonia  Daughter updated on phone 9/24  My cc time 32 minutes  Roselie Awkward, MD [place] [facility] Pager: [phone] Cell: [phone] After 3pm or if no response, call [phone] 08/27/2016, 7:47 AM

## 2016-08-27 NOTE — Progress Notes (Signed)
Patient ID: Deborah [name], female   DOB: July 05, 1956, 60 y.o.   MRN: [id number]   60 y/o woman with DM2, HTN, HL and noncompliance presented to [facility] ER with infrerior posterior STEMI on 9/12. Taken to cath lab and found to have occluded small to moderate OM-1 branch. Underwent successful PCS with stent x 2 by Dr. Clayborn Bigness. Coronaries otherwise ok. EF 55-60% with severe MR confirmed by echo.   Post-cath developed respiratory failure and required intubation. Developed shock and renal failure with peak creatinine 3.4 (1.6 on admit). Trialysis catheter placed.   Patient persistently acidotic with concern for ischemic MR and transferred here. En route develop severe hypotension and norepinephrine started.   Brought to cath lab 9/15 and TEE showed normal LV (EF 60-65%) and normal RV function with ruptured posterior papillary muscle with severe posterior MR.  Underwent placement of Swan and IABP.   S/P MVR 08/23/16.  IABP pulled 9/22.   Remains intubated. Wakes up intermittently but not following commands. CCM unable to wean vent due to respiratory pattern and apneic episodes. Checking ammonia and ABG.   Remains on epi 3 norepi 30 and milrinone 0.25. MAPs 65-70. PA pressures ok. CVVHD pulling 100/hr. Weight down 24 pounds in 2 days. Amio stopped after LFTs went up, Now coming back down. RUQ u/s ok.  Anuric. Co-ox up slightly to 51% after increasing norepi. 25->30. Still with frequent ectopy. Did not tolerate lido wean. Now on 2/min and AV pacing at 100 to override  Scheduled Meds: . aspirin EC  325 mg Oral Daily   Or  . aspirin  324 mg Per Tube Daily  . chlorhexidine gluconate (MEDLINE KIT)  15 mL Mouth Rinse BID  . clopidogrel  75 mg Oral Daily  . docusate sodium  200 mg Oral Daily  . famotidine (PEPCID) IV  20 mg Intravenous Q24H  . insulin aspart  0-24 Units Subcutaneous Q4H  . insulin detemir  10 Units Subcutaneous BID  . mouth rinse  15 mL Mouth Rinse 10 times per day  . sodium chloride  flush  3 mL Intravenous Q12H   Continuous Infusions: . sodium chloride 10 mL/hr at 08/27/16 0700  . sodium chloride    . sodium chloride 10 mL/hr at 08/27/16 0700  . dexmedetomidine 0.7 mcg/kg/hr (08/27/16 0700)  . epinephrine 3 mcg/min (08/27/16 0700)  . feeding supplement (NEPRO CARB STEADY) 1,000 mL (08/27/16 0700)  . heparin 10,000 units/ 20 mL infusion syringe 1,100 Units/hr (08/27/16 0409)  . lactated ringers    . lactated ringers    . lidocaine 2 mg/min (08/27/16 0700)  . milrinone 0.252 mcg/kg/min (08/27/16 0700)  . norepinephrine (LEVOPHED) Adult infusion 29.973 mcg/min (08/27/16 0700)  . dialysis replacement fluid (prismasate) 600 mL/hr at 08/26/16 2337  . dialysis replacement fluid (prismasate) 200 mL/hr at 08/26/16 2029  . dialysate (PRISMASATE) 1,000 mL/hr at 08/27/16 0612   PRN Meds:.sodium chloride, docusate, fentaNYL (SUBLIMAZE) injection, heparin, heparin, lactated ringers, midazolam, ondansetron (ZOFRAN) IV, ondansetron (ZOFRAN) IV, sodium chloride, sodium chloride flush, sodium chloride flush    Vitals:   08/27/16 0645 08/27/16 0700 08/27/16 0715 08/27/16 0734  BP:  94/73    Pulse: 100 100 100 100  Resp: 13 14 14  (!) 24  Temp: 97.7 F (36.5 C) 97.9 F (36.6 C) 98.1 F (36.7 C) 98.2 F (36.8 C)  TempSrc:      SpO2: 98% 99% 97% 100%  Weight:      Height:        Intake/Output  Summary (Last 24 hours) at 08/27/16 0816 Last data filed at 08/27/16 0700  Gross per 24 hour  Intake          2665.16 ml  Output             4552 ml  Net         -1886.84 ml    LABS: Basic Metabolic Panel:  Recent Labs  08/26/16 0430  08/26/16 1600 08/26/16 1618 08/27/16 0321  NA 134*  < > 132* 133* 132*  K 5.1  < > 5.6* 5.5* 4.9  CL 100*  < > 103 99* 100*  CO2 23  < > 22  --  23  GLUCOSE 142*  < > 148* 150* 141*  BUN 19  < > 18 19 17   CREATININE 1.99*  < > 1.97* 2.20* 1.77*  CALCIUM 7.9*  < > 7.4*  --  7.6*  MG 2.4  --   --   --  2.3  PHOS  --   < > 3.8  --  2.9    < > = values in this interval not displayed. Liver Function Tests:  Recent Labs  08/26/16 0430  08/26/16 1600 08/27/16 0321  AST 1,011*  --   --  694*  ALT 541*  --   --  532*  ALKPHOS 157*  --   --  193*  BILITOT 0.8  --   --  0.8  PROT 5.4*  --   --  5.6*  ALBUMIN 2.0*  < > 1.8* 1.9*  < > = values in this interval not displayed. No results for input(s): LIPASE, AMYLASE in the last 72 hours. CBC:  Recent Labs  08/26/16 0430 08/26/16 1618 08/27/16 0321  WBC 27.5*  --  27.8*  HGB 9.2* 9.5* 8.8*  HCT 26.8* 28.0* 25.8*  MCV 83.5  --  84.6  PLT 156  --  148*   Cardiac Enzymes: No results for input(s): CKTOTAL, CKMB, CKMBINDEX, TROPONINI in the last 72 hours. BNP: Invalid input(s): POCBNP D-Dimer: No results for input(s): DDIMER in the last 72 hours. Hemoglobin A1C: No results for input(s): HGBA1C in the last 72 hours. Fasting Lipid Panel: No results for input(s): CHOL, HDL, LDLCALC, TRIG, CHOLHDL, LDLDIRECT in the last 72 hours. Thyroid Function Tests: No results for input(s): TSH, T4TOTAL, T3FREE, THYROIDAB in the last 72 hours.  Invalid input(s): FREET3 Anemia Panel: No results for input(s): VITAMINB12, FOLATE, FERRITIN, TIBC, IRON, RETICCTPCT in the last 72 hours.  RADIOLOGY: Dg Chest 1 View  Result Date: 08/17/2016 CLINICAL DATA:  Endotracheal tube placement, OG tube placement EXAM: CHEST 1 VIEW COMPARISON:  Portable chest x-ray of 08/17/2016 FINDINGS: The tip of the endotracheal tube is within the right mainstem bronchus and needs to be withdrawn by approximately 5 cm per OG tube extends below the hemidiaphragm. Bilateral primarily perihilar airspace disease again is noted most consistent with edema or possibly aspiration. A small left pleural effusion is noted. Right central venous line is unchanged. IMPRESSION: 1. Endotracheal tip is within the right mainstem bronchus and needs to be withdrawn by approximately 5 cm. 2. OG tube extends below the hemidiaphragm. 3.  Little change in bilateral airspace disease with small left pleural effusion. Electronically Signed   By: Ivar Drape M.D.   On: 08/17/2016 08:05   Dg Chest 1 View  Result Date: 08/17/2016 CLINICAL DATA:  Acute respiratory failure. EXAM: CHEST 1 VIEW COMPARISON:  08/15/2016 FINDINGS: Type of the right central line in the mid  SVC. The bilateral perihilar opacities are becoming more confluent, air bronchograms non noted in the right suprahilar region. Stable cardiomegaly. Increasing left lung base atelectasis. Small pleural effusion on prior exam is not well visualized, suspect unchanged. No pneumothorax. IMPRESSION: Coalescing perihilar bilateral opacities, suspicious for increasing pulmonary edema. Increasing left lung base atelectasis. Electronically Signed   By: Jeb Levering M.D.   On: 08/17/2016 03:15   Dg Chest 1 View  Result Date: 08/15/2016 CLINICAL DATA:  Respiratory failure EXAM: CHEST 1 VIEW COMPARISON:  January 22, 2010 FINDINGS: The mediastinal contour is normal. The heart size is enlarged. The diffuse airspace opacities identified throughout bilateral lungs. There is probably a minimal left pleural effusion. The osseous structures are normal. IMPRESSION: Congestive heart failure. Electronically Signed   By: Abelardo Diesel M.D.   On: 08/15/2016 19:25   Dg Abd 1 View  Result Date: 08/17/2016 CLINICAL DATA:  Endotracheal tube placement, OG tube placement EXAM: ABDOMEN - 1 VIEW COMPARISON:  CT abdomen pelvis of 12/10/2010 FINDINGS: The OG tube tip extends into the body the stomach. The bowel gas pattern is nonspecific. Contrast is noted being excreted by the kidneys and within the urinary bladder secondary to recent CT of the abdomen pelvis. The bowel gas pattern is nonspecific. There may be a small left pleural effusion present. IMPRESSION: OG tube tip extends into the body the stomach. The bowel gas pattern is nonspecific. Electronically Signed   By: Ivar Drape M.D.   On: 08/17/2016 08:09     Dg Chest Port 1 View  Result Date: 08/26/2016 CLINICAL DATA:  Status post cardiac surgery and valve replacement. EXAM: PORTABLE CHEST 1 VIEW COMPARISON:  08/25/2016 FINDINGS: Changes from the recent cardiac surgery and mitral valve replacement are stable from most recent prior exam. Cardiac silhouette is mildly enlarged. There is no mediastinal widening. Mild lower lung zone hazy opacity is noted consistent with a combination of small pleural effusions and atelectasis. Mild interstitial thickening is noted centrally which has improved. There was no overt pulmonary edema. No pneumothorax. Endotracheal tube, right internal jugular Swan-Ganz catheter, left internal jugular dual-lumen central venous catheter, nasal/orogastric tube, right chest tube and mediastinal tube are stable in well positioned. Right subclavian central venous line catheter tip projects in the right atrium, also stable. IMPRESSION: 1. Improved lung aeration when compared the prior exam consistent with improved pulmonary edema. 2. Lung base opacity noted consistent with small effusions and atelectasis. 3. No pneumothorax.  No mediastinal widening. 4. Support apparatus is stable. Electronically Signed   By: Lajean Manes M.D.   On: 08/26/2016 08:03   Dg Chest Port 1 View  Result Date: 08/25/2016 CLINICAL DATA:  Status post mitral valve replacement EXAM: PORTABLE CHEST 1 VIEW COMPARISON:  08/24/2016 FINDINGS: Cardiac shadow is stable. Intra-aortic balloon pump is again noted just below the aortic knob. Changes of mitral valve replacement are again seen. Swan-Ganz catheter is noted in the pulmonary outflow tract. Temporary dialysis catheter is noted via the left jugular approach. Right subclavian central line is noted as well. Endotracheal tube and mediastinal drain are again noted. The degree of vascular congestion has improved somewhat in the interval from the prior exam although persists. Bilateral pleural effusions right greater than left  are noted. No focal confluent infiltrate is seen. IMPRESSION: Persistent but improved vascular congestion with bilateral effusions. Tubes and lines as described. Electronically Signed   By: Inez Catalina M.D.   On: 08/25/2016 08:40   Dg Chest Port 1 View  Result Date:  08/24/2016 CLINICAL DATA:  Mitral valve replacement, chest tube EXAM: PORTABLE CHEST 1 VIEW COMPARISON:  08/23/2016 FINDINGS: PA support devices including right chest tube, endotracheal tube Swan-Ganz catheter are unchanged. No pneumothorax. Diffuse interstitial prominence throughout the lungs is increased, likely mild edema. Mild cardiomegaly. Left base atelectasis crash that left base atelectasis has increased. IMPRESSION: Increasing interstitial prominence throughout the lungs, likely worsening interstitial edema. Increasing left base atelectasis. Electronically Signed   By: Rolm Baptise M.D.   On: 08/24/2016 08:41   Dg Chest Portable 1 View  Result Date: 08/23/2016 CLINICAL DATA:  Hypoxia EXAM: PORTABLE CHEST 1 VIEW COMPARISON:  Study obtained earlier in the day FINDINGS: Endotracheal tube tip is 2.6 cm above the carina. Central catheter placed from the right side has its tip in the right atrium slightly beyond the cavoatrial junction. Swan-Ganz catheter tip is in the proximal right main pulmonary artery. Left jugular catheter tip is in the superior vena cava. Nasogastric tube is no longer apparent. Temporary pacemaker wires are attached to the right heart. No pneumothorax. There is perihilar and central interstitial edema. No airspace consolidation. Heart is mildly enlarged. There is mild pulmonary venous hypertension. Prosthetic cardiac valve present. No adenopathy evident. IMPRESSION: Tube and catheter positions as described without pneumothorax. Note that the new catheter placed on the right side has its tip in the right atrium slightly beyond the cavoatrial junction. There is interstitial edema with mild cardiomegaly and mild pulmonary  venous hypertension. No airspace consolidation appreciable. Electronically Signed   By: Lowella Grip III M.D.   On: 08/23/2016 15:34   Dg Chest Port 1 View  Result Date: 08/23/2016 CLINICAL DATA:  Respiratory failure. Acute on chronic diastolic heart failure. Cardiogenic shock. EXAM: PORTABLE CHEST 1 VIEW COMPARISON:  08/22/2016, 08/21/2016 and 08/20/2016 and 08/15/2016 FINDINGS: Endotracheal tube, NG tube and right and left jugular vein central catheters appear in good position, unchanged. Intra aortic balloon pump in place. Heart size and pulmonary vascularity are now normal. Small bilateral pleural effusions. Slight haziness at the lung bases is most likely secondary to the effusions although I think there is an element of slight residual pulmonary edema as well. IMPRESSION: Improving pulmonary edema.  Persistent small bilateral effusions. Electronically Signed   By: Lorriane Shire M.D.   On: 08/23/2016 07:44   Dg Chest Port 1 View  Result Date: 08/22/2016 CLINICAL DATA:  Heart failure.  Balloon pump advanced. EXAM: PORTABLE CHEST 1 VIEW COMPARISON:  08/22/2016 at 4:50 a.m. FINDINGS: Metallic tip of the balloon pump projects over the aortic knob, well positioned. Endotracheal tube, right and left internal jugular central venous lines, femoral a placed Swan-Ganz catheter and nasal/orogastric tube are stable in well positioned. Lung aeration appears mildly improved although the difference may be technical only. There still vascular congestion and interstitial and hazy airspace opacity consistent with asymmetric pulmonary edema. Small pleural effusions are noted. IMPRESSION: 1. Intra-aortic balloon pump tip projects over the aortic knob, well positioned. 2. Possible mild improvement in lung aeration since the earlier study although the difference may be technical only. No other evidence of a change. Electronically Signed   By: Lajean Manes M.D.   On: 08/22/2016 16:34   Dg Chest Port 1 View  Result  Date: 08/22/2016 CLINICAL DATA:  Respiratory failure, shortness of Breath EXAM: PORTABLE CHEST 1 VIEW COMPARISON:  08/21/2016 FINDINGS: Support devices are stable. Cardiomegaly. Bilateral perihilar and lower lobe opacities throughout the lungs have increased slightly since prior study, likely mild edema. Suspect small layering effusions.  IMPRESSION: Mild interval worsening of pulmonary edema/ CHF. Small layering effusions. Electronically Signed   By: Rolm Baptise M.D.   On: 08/22/2016 07:30   Dg Chest Port 1 View  Result Date: 08/21/2016 CLINICAL DATA:  Acute hypoxemic respiratory failure EXAM: PORTABLE CHEST 1 VIEW COMPARISON:  08/20/2016 FINDINGS: Support devices are stable. Endotracheal tube remains at the level of the carina. Borderline heart size. Bilateral airspace opacities are again noted, likely edema. Findings stable or slightly worsened since prior study. Possible small right effusion. IMPRESSION: Slight interval worsening in pulmonary edema pattern. Small right effusion. Endotracheal tube remains at the level of the carina and could be retracted 2-3 cm for optimal positioning. Electronically Signed   By: Rolm Baptise M.D.   On: 08/21/2016 07:40   Dg Chest Port 1 View  Result Date: 08/20/2016 CLINICAL DATA:  Pt was hypotensive with respiratory failure x a few days ago. Hx of DM, and HTN. Pt had a cardiac cath on 08-15-16. EXAM: PORTABLE CHEST 1 VIEW COMPARISON:  08/19/2016 FINDINGS: Endotracheal to appears low at the level of the carina. LEFT and RIGHT central venous line are unchanged. Swan-Ganz catheter from an inferior approach unchanged. There is some improvement aeration lung bases. Mild pulmonary edema remains. No pneumothorax. IMPRESSION: 1. Endotracheal tube appears low at the level the carina. 2. Stable support apparatus. 3. Slight improved aeration lung bases with persistent mild edema. Electronically Signed   By: Suzy Bouchard M.D.   On: 08/20/2016 07:58   Dg Chest Port 1  View  Result Date: 08/19/2016 CLINICAL DATA:  Pulmonary edema EXAM: PORTABLE CHEST 1 VIEW COMPARISON:  08/18/2016 FINDINGS: External pad artifact. Endotracheal tube, central venous lines, and NG tube unchanged. Swan-Ganz catheter and intra aortic balloon pump unchanged. Stable cardiac silhouette. Mild central venous pulmonary congestion. No overt pulmonary edema. No pneumothorax. IMPRESSION: 1. Stable support apparatus. 2. Central venous congestion.  No significant change. Electronically Signed   By: Suzy Bouchard M.D.   On: 08/19/2016 07:55   Dg Chest Port 1 View  Result Date: 08/18/2016 CLINICAL DATA:  Placement of femoral Swan-Ganz catheter and intra-aortic balloon pump catheter. EXAM: PORTABLE CHEST 1 VIEW 4:14 p.m.: COMPARISON:  Portable chest x-ray earlier today 3:37 p.m. and previously. FINDINGS: Femoral Swan-Ganz catheter tip projects at the expected location of the right middle lobe pulmonary artery. Intra-aortic balloon pump catheter tip projects over the proximal descending thoracic aorta. Endotracheal tube tip now projects approximately 2-3 cm above the carina. Right jugular central venous catheter tip projects over the upper SVC. Left jugular central venous catheter tip projects over the lower SVC. Nasogastric tube courses below the diaphragm into the stomach. Improved aeration in the left lower lobe since the most recent examination 45 minutes earlier, though streaky opacities persist. Improved perihilar airspace pulmonary edema in both lungs. No new pulmonary parenchymal abnormalities. IMPRESSION: 1. Swan-Ganz catheter tip projects at the expected location of the right middle lobe pulmonary artery. 2. Intra-aortic balloon pump catheter tip projects over the proximal descending thoracic aorta. 3. Remaining support apparatus satisfactory. 4. Improved aeration in both lungs since the examination performed 45 minutes ago, although streaky left lower lobe atelectasis and/or pneumonia and mild  perihilar airspace pulmonary edema persists. 5. No new abnormalities. Electronically Signed   By: Evangeline Dakin M.D.   On: 08/18/2016 16:43   Dg Chest Port 1 View  Result Date: 08/18/2016 CLINICAL DATA:  ST-elevation myocardial infarction and cardiogenic shock. EXAM: PORTABLE CHEST 1 VIEW COMPARISON:  Yesterday FINDINGS: Endotracheal tube tip  1 cm above the carina. Central line on the right and dialysis catheter on the left with tips at the lower SVC. Orogastric tube reaches the stomach at least. Stable borderline heart size. Worsening aeration at the left base with newly obscured diaphragm. Perihilar airspace disease. IMPRESSION: 1. Lower endotracheal tube with tip 1 cm above the carina. 2. New atelectasis or consolidation in the left lower lobe with small effusion. 3. Pulmonary edema. Electronically Signed   By: Monte Fantasia M.D.   On: 08/18/2016 07:02   Dg Chest Port 1 View  Result Date: 08/17/2016 CLINICAL DATA:  Central line placement EXAM: PORTABLE CHEST 1 VIEW COMPARISON:  Earlier same day FINDINGS: Endotracheal tube is been withdrawn with the tip 2.5 cm proximal to the carina. Right internal jugular central line remains in place with its tip in the SVC above the right atrium. New dual-lumen left internal jugular central line has its tip in the SVC above the right atrium. No pneumothorax. Bilateral bat wing airspace filling is seen consistent with acute edema or acute airspace filling due to other etiologies. No effusions. No collapse. IMPRESSION: Left IJ central line well position with tip in the SVC above the right atrium. No pneumothorax. Endotracheal tube repositioned, well positioned with tip 2.5 cm above the carina. Persistent worsen bat wing edema or airspace filling pattern. Electronically Signed   By: Nelson Chimes M.D.   On: 08/17/2016 10:35   Dg Chest Port 1 View  Result Date: 08/15/2016 CLINICAL DATA:  Central line placement EXAM: PORTABLE CHEST 1 VIEW COMPARISON:  August 15, 2016 7:04 p.m. FINDINGS: Right central venous line is identified with distal tip in superior vena cava. There is no pneumothorax. The heart size mildly enlarged. Diffuse airspace opacities identified throughout both lungs. The bones are unchanged. IMPRESSION: Right central venous line distal tip in the superior vena cava. There is no pneumothorax. Pulmonary edema unchanged compared prior exam. Electronically Signed   By: Abelardo Diesel M.D.   On: 08/15/2016 21:10   Dg Abd Portable 1v  Result Date: 08/25/2016 CLINICAL DATA:  Feeding tube placement. EXAM: PORTABLE ABDOMEN - 1 VIEW COMPARISON:  08/22/2016. FINDINGS: Feeding tube noted with its tip projected over the distal stomach/ proximal duodenum. NG tube noted in stable position with tip over the stomach. Left femoral catheter noted. Right femoral sheath in stable position . Aortic balloon pump tip noted L3. No bowel distention. IMPRESSION: Interim placement of feeding tube, its tip is projected over the distal stomach/ duodenum. Remaining lines and tubes including aortic balloon pump in stable position . Electronically Signed   By: Marcello Moores  Register   On: 08/25/2016 12:50   Dg Abd Portable 1v  Result Date: 08/22/2016 CLINICAL DATA:  Balloon pump position EXAM: PORTABLE ABDOMEN - 1 VIEW COMPARISON:  Chest x-ray 08/22/2016.  KUB 08/17/2016 FINDINGS: Right femoral Swan-Ganz catheter extends above the diaphragm . Intra-aortic balloon pump marker appears overlying the L3 vertebral body in previously was in the thoracic aorta. Left femoral venous catheter in the left iliac vein. NG tube in the stomach. Normal bowel gas pattern. IMPRESSION: Intra-aortic balloon pump marker  overlies the L3 vertebral body. Electronically Signed   By: Franchot Gallo M.D.   On: 08/22/2016 13:12   Dg Abd Portable 1v  Result Date: 08/19/2016 CLINICAL DATA:  Acidosis EXAM: PORTABLE ABDOMEN - 1 VIEW COMPARISON:  08/17/2016 FINDINGS: No dilated loops large or small bowel. NG tube in  stomach. Swan-Ganz catheter noted LEFT basilar atelectasis. The kidneys are dense suggesting  retained IV contrast . IMPRESSION: 1. Normal bowel-gas pattern. 2. Retained contrast in the kidneys consistent consistent with acute kidney injury. Electronically Signed   By: Suzy Bouchard M.D.   On: 08/19/2016 08:02   US Abdomen Limited Ruq  Result Date: 08/26/2016 CLINICAL DATA:  Acute onset of abnormal LFTs.  Initial encounter. EXAM: US ABDOMEN LIMITED - RIGHT UPPER QUADRANT COMPARISON:  None. FINDINGS: Gallbladder: Gallbladder wall thickening is noted, measuring up to 5 mm. Vague internal echoes are seen within the gallbladder, suggestive of underlying sludge. No definite stones are identified. No pericholecystic fluid is seen. Evaluation for ultrasonographic Murphy's sign is not possible, as the patient is sedated and on ventilation. Common bile duct: Diameter: 0.5 cm, within normal limits in caliber. Liver: No focal lesion identified. Within normal limits in parenchymal echogenicity. The left hepatic lobe is not assessed as the patient has overlying bandages, due to recent CABG and mitral valve replacement. A small right-sided pleural effusion is noted. IMPRESSION: 1. Gallbladder wall thickening noted. Suggestion of underlying sludge filling the gallbladder. No definite evidence of cholelithiasis. Findings may reflect mild chronic inflammation. No pericholecystic fluid is seen to suggest acute cholecystitis. 2. Small right-sided pleural effusion noted. 3. Liver grossly unremarkable in appearance, though the left hepatic lobe is not imaged on this study. Electronically Signed   By: Garald Balding M.D.   On: 08/26/2016 18:29    PHYSICAL EXAM General: Intubated/sedated Neck: RIJ swan LIJ trialysis  Lungs: Clear anteriorly CV: Nondisplaced PMI.  Heart regular S1/S2, no S3/S4, 3/6 HSM apex.  1+ peripheral edema.   Abdomen: Soft, no hepatosplenomegaly, no distention.  Neurologic: Sedated.   Extremities: No  clubbing or cyanosis. Right groin site w/o hematoma. GU: Foley    TELEMETRY: AV pacing at 100 with ectopy underlying rhythm junctional vs AF with frequent PVCs in bigeminal pattern   Assessment:   1. Cardiogenic shock: Due to acute severe MR.  2. CAD: Inferior posterior STEMI on 9/13 with DES x 2 to OM-1      --Plavix restarted on 9/22 3. 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     --s/p MVR 4. AKI on CKD stage III - due to ATN likely from cardiogenic shock + contrast with cath. Has required CVVH.  5. Acute respiratory failure: Pulmonary edema.  6. DM2 7. Suspect component of septic shock: Possible PNA, group B Strep in sputum culture.  8. Anemia 9. PVCs   10. 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 S/P MVR 08/23/16  Remains on triple pressors with good MAPs. PA pressures and thermo CO ok. However co-ox remains marginal. Repeat echo ordered. Will continue pressors as is for now. Volume status improved. Have slowed CVVHD rate .   LFTs now improving. Suspect probable shock liver. Though unclear.  Amio and TFs stopped. RUQ ok. Hepatitis panels negative Also need to consider transfusion reaction/infection - though rare.   Continues with significant ectopy. Lido back at 2 and being overdrive paced. Will need to wean lido soon. Can consider mexilitene.   Back on Plavix with recent stent.   Unable to wean from vent. Concern for possible anoxic injury or hepatic encephalopathy, Check NH3. Will likely need trach soon. D/w Dr. [place] Bells  WBC persistently elevated. Will pull out Bernice today. May needs lines changes soon.  Bcx 9/15 negative. Will repeat.   The patient is critically ill with multiple organ systems failure and requires high complexity  decision making for assessment and support, frequent evaluation and titration of therapies, application of advanced monitoring technologies and extensive interpretation of multiple databases.   Critical Care Time devoted to patient care services described in this note is 45 Minutes.  Elizabth Palka,MD 8:16 AM

## 2016-08-27 NOTE — Progress Notes (Addendum)
Patient ID: Deborah [name], female   DOB: Jun 07, 1956, 60 y.o.   MRN: [id number]   SICU Evening Rounds:   Hemodynamically stable, AV paced Levophed decreased to 22 mcg  No changes on vent  Foley is out CT output low  CBC    Component Value Date/Time   WBC 27.8 (H) 08/27/2016 0321   RBC 3.05 (L) 08/27/2016 0321   HGB 9.5 (L) 08/27/2016 1635   HCT 28.0 (L) 08/27/2016 1635   PLT 148 (L) 08/27/2016 0321   MCV 84.6 08/27/2016 0321   MCH 28.9 08/27/2016 0321   MCHC 34.1 08/27/2016 0321   RDW 15.2 08/27/2016 0321   LYMPHSABS 2.3 08/23/2016 0328   MONOABS 4.1 (H) 08/23/2016 0328   EOSABS 0.5 08/23/2016 0328   BASOSABS 0.0 08/23/2016 0328     BMET    Component Value Date/Time   NA 131 (L) 08/27/2016 1647   K 4.8 08/27/2016 1647   CL 100 (L) 08/27/2016 1647   CO2 23 08/27/2016 1647   GLUCOSE 179 (H) 08/27/2016 1647   BUN 18 08/27/2016 1647   CREATININE 1.65 (H) 08/27/2016 1647   CREATININE 1.41 (H) 10/04/2012 1631   CALCIUM 7.6 (L) 08/27/2016 1647   GFRNONAA 33 (L) 08/27/2016 1647   GFRAA 38 (L) 08/27/2016 1647     A/P: Multi-system organ failure possibly due to sepsis and preop cardiogenic shock. Weaning Levophed as tolerated. Today was her last dose of Rocephin so will have to decide about further antibiotics tomorrow. Continue current plans.

## 2016-08-28 ENCOUNTER — Inpatient Hospital Stay (HOSPITAL_COMMUNITY): Payer: Medicaid Other

## 2016-08-28 DIAGNOSIS — N179 Acute kidney failure, unspecified: Secondary | ICD-10-CM

## 2016-08-28 DIAGNOSIS — I2510 Atherosclerotic heart disease of native coronary artery without angina pectoris: Secondary | ICD-10-CM

## 2016-08-28 LAB — ECHOCARDIOGRAM LIMITED
E decel time: 209 msec
FS: 17 % — AB (ref 28–44)
HEIGHTINCHES: 61 in
HEIGHTINCHES: 61 in
IV/PV OW: 0.86
LA diam end sys: 31 mm
LA diam index: 1.78 cm/m2
LASIZE: 31 mm
LVOT MV VTI INDEX: 0.45 cm2/m2
LVOT MV VTI: 0.79
LVOT VTI: 17 cm
LVOT area: 1.54 cm2
LVOT diameter: 14 mm
LVOT peak grad rest: 9 mmHg
LVOTPV: 148 cm/s
LVOTSV: 26 mL
MV Annulus VTI: 33.3 cm
MV Dec: 209
MV M vel: 94
MV pk A vel: 101 m/s
MVG: 4 mmHg
MVPG: 8 mmHg
MVPKEVEL: 144 m/s
PW: 12.5 mm — AB (ref 0.6–1.1)
WEIGHTICAEL: 2645.52 [oz_av]
WEIGHTICAEL: 2645.52 [oz_av]

## 2016-08-28 LAB — POCT I-STAT 3, ART BLOOD GAS (G3+)
Acid-Base Excess: 1 mmol/L (ref 0.0–2.0)
BICARBONATE: 25 mmol/L (ref 20.0–28.0)
O2 SAT: 97 %
PH ART: 7.425 (ref 7.350–7.450)
TCO2: 26 mmol/L (ref 0–100)
pCO2 arterial: 38 mmHg (ref 32.0–48.0)
pO2, Arterial: 88 mmHg (ref 83.0–108.0)

## 2016-08-28 LAB — GLUCOSE, CAPILLARY
GLUCOSE-CAPILLARY: 152 mg/dL — AB (ref 65–99)
GLUCOSE-CAPILLARY: 178 mg/dL — AB (ref 65–99)
GLUCOSE-CAPILLARY: 218 mg/dL — AB (ref 65–99)
Glucose-Capillary: 172 mg/dL — ABNORMAL HIGH (ref 65–99)
Glucose-Capillary: 193 mg/dL — ABNORMAL HIGH (ref 65–99)
Glucose-Capillary: 210 mg/dL — ABNORMAL HIGH (ref 65–99)

## 2016-08-28 LAB — COMPREHENSIVE METABOLIC PANEL
ALT: 387 U/L — ABNORMAL HIGH (ref 14–54)
AST: 299 U/L — ABNORMAL HIGH (ref 15–41)
Albumin: 1.9 g/dL — ABNORMAL LOW (ref 3.5–5.0)
Alkaline Phosphatase: 191 U/L — ABNORMAL HIGH (ref 38–126)
Anion gap: 7 (ref 5–15)
BUN: 18 mg/dL (ref 6–20)
CO2: 24 mmol/L (ref 22–32)
Calcium: 7.4 mg/dL — ABNORMAL LOW (ref 8.9–10.3)
Chloride: 99 mmol/L — ABNORMAL LOW (ref 101–111)
Creatinine, Ser: 1.57 mg/dL — ABNORMAL HIGH (ref 0.44–1.00)
GFR calc Af Amer: 41 mL/min — ABNORMAL LOW (ref >60)
GFR calc non Af Amer: 35 mL/min — ABNORMAL LOW (ref >60)
Glucose, Bld: 216 mg/dL — ABNORMAL HIGH (ref 65–99)
Potassium: 4.6 mmol/L (ref 3.5–5.1)
Sodium: 130 mmol/L — ABNORMAL LOW (ref 135–145)
Total Bilirubin: 0.9 mg/dL (ref 0.3–1.2)
Total Protein: 5.8 g/dL — ABNORMAL LOW (ref 6.5–8.1)

## 2016-08-28 LAB — POCT ACTIVATED CLOTTING TIME
ACTIVATED CLOTTING TIME: 191 s
ACTIVATED CLOTTING TIME: 197 s
Activated Clotting Time: 197 seconds
Activated Clotting Time: 202 seconds
Activated Clotting Time: 202 seconds
Activated Clotting Time: 219 seconds
Activated Clotting Time: 180 seconds
Activated Clotting Time: 186 seconds
Activated Clotting Time: 202 seconds
Activated Clotting Time: 202 seconds

## 2016-08-28 LAB — CARBOXYHEMOGLOBIN
CARBOXYHEMOGLOBIN: 1.1 % (ref 0.5–1.5)
METHEMOGLOBIN: 1.2 % (ref 0.0–1.5)
O2 Saturation: 51.5 %
TOTAL HEMOGLOBIN: 12.9 g/dL (ref 12.0–16.0)

## 2016-08-28 LAB — RENAL FUNCTION PANEL
ALBUMIN: 1.8 g/dL — AB (ref 3.5–5.0)
ANION GAP: 8 (ref 5–15)
BUN: 21 mg/dL — ABNORMAL HIGH (ref 6–20)
CALCIUM: 7.7 mg/dL — AB (ref 8.9–10.3)
CO2: 24 mmol/L (ref 22–32)
CREATININE: 1.47 mg/dL — AB (ref 0.44–1.00)
Chloride: 100 mmol/L — ABNORMAL LOW (ref 101–111)
GFR calc Af Amer: 44 mL/min — ABNORMAL LOW (ref >60)
GFR calc non Af Amer: 38 mL/min — ABNORMAL LOW (ref >60)
GLUCOSE: 238 mg/dL — AB (ref 65–99)
PHOSPHORUS: 3.8 mg/dL (ref 2.5–4.6)
Potassium: 4.5 mmol/L (ref 3.5–5.1)
SODIUM: 132 mmol/L — AB (ref 135–145)

## 2016-08-28 LAB — CBC
HCT: 24.7 % — ABNORMAL LOW (ref 36.0–46.0)
Hemoglobin: 8.3 g/dL — ABNORMAL LOW (ref 12.0–15.0)
MCH: 28.8 pg (ref 26.0–34.0)
MCHC: 33.6 g/dL (ref 30.0–36.0)
MCV: 85.8 fL (ref 78.0–100.0)
Platelets: 148 10*3/uL — ABNORMAL LOW (ref 150–400)
RBC: 2.88 MIL/uL — ABNORMAL LOW (ref 3.87–5.11)
RDW: 15.3 % (ref 11.5–15.5)
WBC: 30.1 10*3/uL — ABNORMAL HIGH (ref 4.0–10.5)

## 2016-08-28 LAB — PHOSPHORUS: PHOSPHORUS: 2.4 mg/dL — AB (ref 2.5–4.6)

## 2016-08-28 LAB — PREPARE RBC (CROSSMATCH)

## 2016-08-28 LAB — PROCALCITONIN: PROCALCITONIN: 6.74 ng/mL

## 2016-08-28 LAB — MAGNESIUM: MAGNESIUM: 2.3 mg/dL (ref 1.7–2.4)

## 2016-08-28 LAB — APTT: aPTT: 156 seconds — ABNORMAL HIGH (ref 24–36)

## 2016-08-28 MED ORDER — PIPERACILLIN-TAZOBACTAM 3.375 G IVPB 30 MIN
3.3750 g | Freq: Four times a day (QID) | INTRAVENOUS | Status: AC
  Administered 2016-08-28 – 2016-09-01 (×18): 3.375 g via INTRAVENOUS
  Filled 2016-08-28 (×18): qty 50

## 2016-08-28 MED ORDER — PIPERACILLIN-TAZOBACTAM 3.375 G IVPB 30 MIN
3.3750 g | Freq: Once | INTRAVENOUS | Status: AC
  Administered 2016-08-28: 3.375 g via INTRAVENOUS
  Filled 2016-08-28: qty 50

## 2016-08-28 MED ORDER — FAMOTIDINE IN NACL 20-0.9 MG/50ML-% IV SOLN
20.0000 mg | Freq: Two times a day (BID) | INTRAVENOUS | Status: DC
  Administered 2016-08-28 – 2016-09-05 (×17): 20 mg via INTRAVENOUS
  Filled 2016-08-28 (×17): qty 50

## 2016-08-28 MED ORDER — NEPRO/CARBSTEADY PO LIQD
1000.0000 mL | ORAL | Status: DC
Start: 2016-08-29 — End: 2016-08-29
  Administered 2016-08-28 – 2016-08-29 (×2): 1000 mL
  Filled 2016-08-28 (×3): qty 1000

## 2016-08-28 MED ORDER — PERFLUTREN LIPID MICROSPHERE
1.0000 mL | INTRAVENOUS | Status: AC | PRN
  Administered 2016-08-28: 2 mL via INTRAVENOUS
  Filled 2016-08-28: qty 10

## 2016-08-28 MED ORDER — SODIUM PHOSPHATES 45 MMOLE/15ML IV SOLN
30.0000 mmol | Freq: Once | INTRAVENOUS | Status: AC
  Administered 2016-08-28: 30 mmol via INTRAVENOUS
  Filled 2016-08-28: qty 10

## 2016-08-28 MED ORDER — PIPERACILLIN-TAZOBACTAM 3.375 G IVPB
3.3750 g | Freq: Three times a day (TID) | INTRAVENOUS | Status: DC
  Filled 2016-08-28 (×2): qty 50

## 2016-08-28 MED ORDER — PERFLUTREN LIPID MICROSPHERE
INTRAVENOUS | Status: AC
  Filled 2016-08-28: qty 10

## 2016-08-28 MED ORDER — SODIUM CHLORIDE 0.9 % IV SOLN: INTRAVENOUS | Status: DC | PRN

## 2016-08-28 MED ORDER — FLUCONAZOLE IN SODIUM CHLORIDE 100-0.9 MG/50ML-% IV SOLN
100.0000 mg | INTRAVENOUS | Status: DC
  Administered 2016-08-28: 100 mg via INTRAVENOUS
  Filled 2016-08-28: qty 50

## 2016-08-28 MED ORDER — FLUCONAZOLE IN SODIUM CHLORIDE 400-0.9 MG/200ML-% IV SOLN
400.0000 mg | INTRAVENOUS | Status: AC
  Administered 2016-08-29 – 2016-08-30 (×2): 400 mg via INTRAVENOUS
  Filled 2016-08-28 (×2): qty 200

## 2016-08-28 MED ORDER — VANCOMYCIN HCL IN DEXTROSE 1-5 GM/200ML-% IV SOLN
1000.0000 mg | INTRAVENOUS | Status: DC
Start: 2016-08-28 — End: 2016-08-31
  Administered 2016-08-28 – 2016-08-31 (×4): 1000 mg via INTRAVENOUS
  Filled 2016-08-28 (×4): qty 200

## 2016-08-28 NOTE — Progress Notes (Signed)
CT surgery p.m. Rounds  Patient had stable day Epinephrine slowly being weaned Head CT scan shows no evidence of stroke  Echocardiogram performed yesterday shows normal LV EF, hyperdynamic Femoral A-line exchanged for a new left brachial A-line Patient for a new tunneled hemodialysis catheter later this week

## 2016-08-28 NOTE — Progress Notes (Signed)
  Echocardiogram 2D Echocardiogram limited with Definity has been performed.  Tresa Res 08/28/2016, 4:16 PM

## 2016-08-28 NOTE — Progress Notes (Addendum)
Patient ID: Deborah [name], female   DOB: [birth year], 60 y.o.   MRN: [id number]   60 y/o woman with DM2, HTN, HL and noncompliance presented to [facility] ER with infrerior posterior STEMI on 9/12. Taken to cath lab and found to have occluded small to moderate OM-1 branch. Underwent successful PCS with stent x 2 by Dr. Clayborn Bigness. Coronaries otherwise ok. EF 55-60% with severe MR confirmed by echo.   Post-cath developed respiratory failure and required intubation. Developed shock and renal failure with peak creatinine 3.4 (1.6 on admit). Trialysis catheter placed.   Patient persistently acidotic with concern for ischemic MR and transferred here. En route develop severe hypotension and norepinephrine started.   Brought to cath lab 9/15 and TEE showed normal LV (EF 60-65%) and normal RV function with ruptured posterior papillary muscle with severe posterior MR.  Underwent placement of Swan and IABP.   S/P MVR 08/23/16.  IABP pulled 9/22.   Remains intubated. Wakes up intermittently but not following commands. CCM unable to wean vent due to respiratory pattern and apneic episodes. Ammonia was 27  Remains on epi 3. norepi weaned from 30 to 12. Also on  milrinone 0.25. MAPs 70s. PA pressures ok. CVVHD pulling slightly negative. Co-ox stable but low at 51%  Weight down 27 pounds in 3 days. Amio stopped after LFTs went u, Now coming back down. RUQ u/s ok.    Did not tolerate lido wean over weekend due to ectopy. Now on 2/min and AV pacing at 100 to override  WBC climbing now 30.1  Foley and Swan out 9/24/   Scheduled Meds: . aspirin EC  325 mg Oral Daily   Or  . aspirin  324 mg Per Tube Daily  . chlorhexidine gluconate (MEDLINE KIT)  15 mL Mouth Rinse BID  . clopidogrel  75 mg Oral Daily  . docusate sodium  200 mg Oral Daily  . famotidine (PEPCID) IV  20 mg Intravenous Q24H  . insulin aspart  0-24 Units Subcutaneous Q4H  . insulin detemir  10 Units Subcutaneous BID  . mouth rinse  15 mL Mouth  Rinse 10 times per day  . sodium chloride flush  3 mL Intravenous Q12H  . sodium phosphate  Dextrose 5% IVPB  30 mmol Intravenous Once   Continuous Infusions: . sodium chloride Stopped (08/27/16 0900)  . sodium chloride    . sodium chloride 20 mL/hr at 08/28/16 0600  . dexmedetomidine 0.503 mcg/kg/hr (08/28/16 0600)  . epinephrine 3 mcg/min (08/28/16 0600)  . feeding supplement (NEPRO CARB STEADY) 1,000 mL (08/28/16 0600)  . heparin 10,000 units/ 20 mL infusion syringe 1,200 Units/hr (08/28/16 0440)  . lactated ringers    . lactated ringers    . lidocaine 2 mg/min (08/28/16 0600)  . milrinone 0.252 mcg/kg/min (08/28/16 0600)  . norepinephrine (LEVOPHED) Adult infusion 10.987 mcg/min (08/28/16 0600)  . dialysis replacement fluid (prismasate) 600 mL/hr at 08/28/16 0128  . dialysis replacement fluid (prismasate) 200 mL/hr at 08/27/16 2113  . dialysate (PRISMASATE) 1,000 mL/hr at 08/28/16 0209   PRN Meds:.sodium chloride, docusate, fentaNYL (SUBLIMAZE) injection, heparin, heparin, lactated ringers, midazolam, ondansetron (ZOFRAN) IV, ondansetron (ZOFRAN) IV, sodium chloride, sodium chloride flush, sodium chloride flush    Vitals:   08/28/16 0530 08/28/16 0545 08/28/16 0600 08/28/16 0615  BP:   116/79   Pulse:      Resp: _0 Temp:      TempSrc:      SpO2:  Weight:      Height:        Intake/Output Summary (Last 24 hours) at 08/28/16 0655 Last data filed at 08/28/16 0600  Gross per 24 hour  Intake          2841.61 ml  Output             4641 ml  Net         -1799.39 ml    LABS: Basic Metabolic Panel:  Recent Labs  08/27/16 0321  08/27/16 1647 08/28/16 0429  NA 132*  < > 131* 130*  K 4.9  < > 4.8 4.6  CL 100*  < > 100* 99*  CO2 23  --  23 24  GLUCOSE 141*  < > 179* 216*  BUN 17  < > 18 18  CREATININE 1.77*  < > 1.65* 1.57*  CALCIUM 7.6*  --  7.6* 7.4*  MG 2.3  --   --  2.3  PHOS 2.9  --  2.4* 2.4*  < > = values in this interval not  displayed. Liver Function Tests:  Recent Labs  08/27/16 0321 08/27/16 1647 08/28/16 0429  AST 694*  --  299*  ALT 532*  --  387*  ALKPHOS 193*  --  191*  BILITOT 0.8  --  0.9  PROT 5.6*  --  5.8*  ALBUMIN 1.9* 1.9* 1.9*   No results for input(s): LIPASE, AMYLASE in the last 72 hours. CBC:  Recent Labs  08/27/16 0321 08/27/16 1635 08/28/16 0429  WBC 27.8*  --  30.1*  HGB 8.8* 9.5* 8.3*  HCT 25.8* 28.0* 24.7*  MCV 84.6  --  85.8  PLT 148*  --  148*   Cardiac Enzymes: No results for input(s): CKTOTAL, CKMB, CKMBINDEX, TROPONINI in the last 72 hours. BNP: Invalid input(s): POCBNP D-Dimer: No results for input(s): DDIMER in the last 72 hours. Hemoglobin A1C: No results for input(s): HGBA1C in the last 72 hours. Fasting Lipid Panel: No results for input(s): CHOL, HDL, LDLCALC, TRIG, CHOLHDL, LDLDIRECT in the last 72 hours. Thyroid Function Tests: No results for input(s): TSH, T4TOTAL, T3FREE, THYROIDAB in the last 72 hours.  Invalid input(s): FREET3 Anemia Panel: No results for input(s): VITAMINB12, FOLATE, FERRITIN, TIBC, IRON, RETICCTPCT in the last 72 hours.  RADIOLOGY: Dg Chest 1 View  Result Date: 08/17/2016 CLINICAL DATA:  Endotracheal tube placement, OG tube placement EXAM: CHEST 1 VIEW COMPARISON:  Portable chest x-ray of 08/17/2016 FINDINGS: The tip of the endotracheal tube is within the right mainstem bronchus and needs to be withdrawn by approximately 5 cm per OG tube extends below the hemidiaphragm. Bilateral primarily perihilar airspace disease again is noted most consistent with edema or possibly aspiration. A small left pleural effusion is noted. Right central venous line is unchanged. IMPRESSION: 1. Endotracheal tip is within the right mainstem bronchus and needs to be withdrawn by approximately 5 cm. 2. OG tube extends below the hemidiaphragm. 3. Little change in bilateral airspace disease with small left pleural effusion. Electronically Signed   By: Ivar Drape M.D.   On: 08/17/2016 08:05   Dg Chest 1 View  Result Date: 08/17/2016 CLINICAL DATA:  Acute respiratory failure. EXAM: CHEST 1 VIEW COMPARISON:  08/15/2016 FINDINGS: Type of the right central line in the mid SVC. The bilateral perihilar opacities are becoming more confluent, air bronchograms non noted in the right suprahilar region. Stable cardiomegaly. Increasing left lung base atelectasis. Small pleural effusion on prior exam is not well visualized,  suspect unchanged. No pneumothorax. IMPRESSION: Coalescing perihilar bilateral opacities, suspicious for increasing pulmonary edema. Increasing left lung base atelectasis. Electronically Signed   By: Jeb Levering M.D.   On: 08/17/2016 03:15   Dg Chest 1 View  Result Date: 08/15/2016 CLINICAL DATA:  Respiratory failure EXAM: CHEST 1 VIEW COMPARISON:  January 22, 2010 FINDINGS: The mediastinal contour is normal. The heart size is enlarged. The diffuse airspace opacities identified throughout bilateral lungs. There is probably a minimal left pleural effusion. The osseous structures are normal. IMPRESSION: Congestive heart failure. Electronically Signed   By: Abelardo Diesel M.D.   On: 08/15/2016 19:25   Dg Abd 1 View  Result Date: 08/17/2016 CLINICAL DATA:  Endotracheal tube placement, OG tube placement EXAM: ABDOMEN - 1 VIEW COMPARISON:  CT abdomen pelvis of 12/10/2010 FINDINGS: The OG tube tip extends into the body the stomach. The bowel gas pattern is nonspecific. Contrast is noted being excreted by the kidneys and within the urinary bladder secondary to recent CT of the abdomen pelvis. The bowel gas pattern is nonspecific. There may be a small left pleural effusion present. IMPRESSION: OG tube tip extends into the body the stomach. The bowel gas pattern is nonspecific. Electronically Signed   By: Ivar Drape M.D.   On: 08/17/2016 08:09   Dg Chest Port 1 View  Result Date: 08/27/2016 CLINICAL DATA:  60 year old female with a history of mitral  valve repair EXAM: PORTABLE CHEST 1 VIEW COMPARISON:  08/27/2016, 08/26/2016, 08/25/2016 FINDINGS: Cardiomediastinal silhouette unchanged. Surgical changes of prior median sternotomy and mitral valve annuloplasty. Unchanged position of right-sided thoracostomy tube, gastric tube, right subclavian central line. Unchanged position of the endotracheal tube. Unchanged left-sided IJ central venous catheter. Unchanged position of mediastinal drain and epicardial pacing leads. Interval removal of right IJ sheath and Swan-Ganz catheter. Minimal interstitial airspace opacities at the lung bases. Blunting of left costophrenic angle with pleural parenchymal thickening. No pneumothorax. IMPRESSION: Interval removal of right IJ sheath and Swan-Ganz catheter. Mild airspace disease at the bases with likely trace left pleural effusion. Unchanged endotracheal tube, gastric tube, right subclavian central line, left IJ catheter, mediastinal/pleural drains, and epicardial pacing leads. Surgical changes of median sternotomy and mitral valve annuloplasty. Signed, Dulcy Fanny. Earleen Newport, DO [facility] Electronically Signed   By: Corrie Mckusick D.O.   On: 08/27/2016 17:12   Dg Chest Port 1 View  Result Date: 08/26/2016 CLINICAL DATA:  Status post cardiac surgery and valve replacement. EXAM: PORTABLE CHEST 1 VIEW COMPARISON:  08/25/2016 FINDINGS: Changes from the recent cardiac surgery and mitral valve replacement are stable from most recent prior exam. Cardiac silhouette is mildly enlarged. There is no mediastinal widening. Mild lower lung zone hazy opacity is noted consistent with a combination of small pleural effusions and atelectasis. Mild interstitial thickening is noted centrally which has improved. There was no overt pulmonary edema. No pneumothorax. Endotracheal tube, right internal jugular Swan-Ganz catheter, left internal jugular dual-lumen central venous catheter,  nasal/orogastric tube, right chest tube and mediastinal tube are stable in well positioned. Right subclavian central venous line catheter tip projects in the right atrium, also stable. IMPRESSION: 1. Improved lung aeration when compared the prior exam consistent with improved pulmonary edema. 2. Lung base opacity noted consistent with small effusions and atelectasis. 3. No pneumothorax.  No mediastinal widening. 4. Support apparatus is stable. Electronically Signed   By: Lajean Manes M.D.   On: 08/26/2016 08:03   Dg Chest [facility] 1 View  Result  Date: 08/25/2016 CLINICAL DATA:  Status post mitral valve replacement EXAM: PORTABLE CHEST 1 VIEW COMPARISON:  08/24/2016 FINDINGS: Cardiac shadow is stable. Intra-aortic balloon pump is again noted just below the aortic knob. Changes of mitral valve replacement are again seen. Swan-Ganz catheter is noted in the pulmonary outflow tract. Temporary dialysis catheter is noted via the left jugular approach. Right subclavian central line is noted as well. Endotracheal tube and mediastinal drain are again noted. The degree of vascular congestion has improved somewhat in the interval from the prior exam although persists. Bilateral pleural effusions right greater than left are noted. No focal confluent infiltrate is seen. IMPRESSION: Persistent but improved vascular congestion with bilateral effusions. Tubes and lines as described. Electronically Signed   By: Inez Catalina M.D.   On: 08/25/2016 08:40   Dg Chest Port 1 View  Result Date: 08/24/2016 CLINICAL DATA:  Mitral valve replacement, chest tube EXAM: PORTABLE CHEST 1 VIEW COMPARISON:  08/23/2016 FINDINGS: PA support devices including right chest tube, endotracheal tube Swan-Ganz catheter are unchanged. No pneumothorax. Diffuse interstitial prominence throughout the lungs is increased, likely mild edema. Mild cardiomegaly. Left base atelectasis crash that left base atelectasis has increased. IMPRESSION: Increasing  interstitial prominence throughout the lungs, likely worsening interstitial edema. Increasing left base atelectasis. Electronically Signed   By: Rolm Baptise M.D.   On: 08/24/2016 08:41   Dg Chest Portable 1 View  Result Date: 08/23/2016 CLINICAL DATA:  Hypoxia EXAM: PORTABLE CHEST 1 VIEW COMPARISON:  Study obtained earlier in the day FINDINGS: Endotracheal tube tip is 2.6 cm above the carina. Central catheter placed from the right side has its tip in the right atrium slightly beyond the cavoatrial junction. Swan-Ganz catheter tip is in the proximal right main pulmonary artery. Left jugular catheter tip is in the superior vena cava. Nasogastric tube is no longer apparent. Temporary pacemaker wires are attached to the right heart. No pneumothorax. There is perihilar and central interstitial edema. No airspace consolidation. Heart is mildly enlarged. There is mild pulmonary venous hypertension. Prosthetic cardiac valve present. No adenopathy evident. IMPRESSION: Tube and catheter positions as described without pneumothorax. Note that the new catheter placed on the right side has its tip in the right atrium slightly beyond the cavoatrial junction. There is interstitial edema with mild cardiomegaly and mild pulmonary venous hypertension. No airspace consolidation appreciable. Electronically Signed   By: Lowella Grip III M.D.   On: 08/23/2016 15:34   Dg Chest Port 1 View  Result Date: 08/23/2016 CLINICAL DATA:  Respiratory failure. Acute on chronic diastolic heart failure. Cardiogenic shock. EXAM: PORTABLE CHEST 1 VIEW COMPARISON:  08/22/2016, 08/21/2016 and 08/20/2016 and 08/15/2016 FINDINGS: Endotracheal tube, NG tube and right and left jugular vein central catheters appear in good position, unchanged. Intra aortic balloon pump in place. Heart size and pulmonary vascularity are now normal. Small bilateral pleural effusions. Slight haziness at the lung bases is most likely secondary to the effusions  although I think there is an element of slight residual pulmonary edema as well. IMPRESSION: Improving pulmonary edema.  Persistent small bilateral effusions. Electronically Signed   By: Lorriane Shire M.D.   On: 08/23/2016 07:44   Dg Chest Port 1 View  Result Date: 08/22/2016 CLINICAL DATA:  Heart failure.  Balloon pump advanced. EXAM: PORTABLE CHEST 1 VIEW COMPARISON:  08/22/2016 at 4:50 a.m. FINDINGS: Metallic tip of the balloon pump projects over the aortic knob, well positioned. Endotracheal tube, right and left internal jugular central venous lines, femoral a placed Swan-Ganz  catheter and nasal/orogastric tube are stable in well positioned. Lung aeration appears mildly improved although the difference may be technical only. There still vascular congestion and interstitial and hazy airspace opacity consistent with asymmetric pulmonary edema. Small pleural effusions are noted. IMPRESSION: 1. Intra-aortic balloon pump tip projects over the aortic knob, well positioned. 2. Possible mild improvement in lung aeration since the earlier study although the difference may be technical only. No other evidence of a change. Electronically Signed   By: Lajean Manes M.D.   On: 08/22/2016 16:34   Dg Chest Port 1 View  Result Date: 08/22/2016 CLINICAL DATA:  Respiratory failure, shortness of Breath EXAM: PORTABLE CHEST 1 VIEW COMPARISON:  08/21/2016 FINDINGS: Support devices are stable. Cardiomegaly. Bilateral perihilar and lower lobe opacities throughout the lungs have increased slightly since prior study, likely mild edema. Suspect small layering effusions. IMPRESSION: Mild interval worsening of pulmonary edema/ CHF. Small layering effusions. Electronically Signed   By: Rolm Baptise M.D.   On: 08/22/2016 07:30   Dg Chest Port 1 View  Result Date: 08/21/2016 CLINICAL DATA:  Acute hypoxemic respiratory failure EXAM: PORTABLE CHEST 1 VIEW COMPARISON:  08/20/2016 FINDINGS: Support devices are stable. Endotracheal  tube remains at the level of the carina. Borderline heart size. Bilateral airspace opacities are again noted, likely edema. Findings stable or slightly worsened since prior study. Possible small right effusion. IMPRESSION: Slight interval worsening in pulmonary edema pattern. Small right effusion. Endotracheal tube remains at the level of the carina and could be retracted 2-3 cm for optimal positioning. Electronically Signed   By: Rolm Baptise M.D.   On: 08/21/2016 07:40   Dg Chest Port 1 View  Result Date: 08/20/2016 CLINICAL DATA:  Pt was hypotensive with respiratory failure x a few days ago. Hx of DM, and HTN. Pt had a cardiac cath on 08-15-16. EXAM: PORTABLE CHEST 1 VIEW COMPARISON:  08/19/2016 FINDINGS: Endotracheal to appears low at the level of the carina. LEFT and RIGHT central venous line are unchanged. Swan-Ganz catheter from an inferior approach unchanged. There is some improvement aeration lung bases. Mild pulmonary edema remains. No pneumothorax. IMPRESSION: 1. Endotracheal tube appears low at the level the carina. 2. Stable support apparatus. 3. Slight improved aeration lung bases with persistent mild edema. Electronically Signed   By: Suzy Bouchard M.D.   On: 08/20/2016 07:58   Dg Chest Port 1 View  Result Date: 08/19/2016 CLINICAL DATA:  Pulmonary edema EXAM: PORTABLE CHEST 1 VIEW COMPARISON:  08/18/2016 FINDINGS: External pad artifact. Endotracheal tube, central venous lines, and NG tube unchanged. Swan-Ganz catheter and intra aortic balloon pump unchanged. Stable cardiac silhouette. Mild central venous pulmonary congestion. No overt pulmonary edema. No pneumothorax. IMPRESSION: 1. Stable support apparatus. 2. Central venous congestion.  No significant change. Electronically Signed   By: Suzy Bouchard M.D.   On: 08/19/2016 07:55   Dg Chest Port 1 View  Result Date: 08/18/2016 CLINICAL DATA:  Placement of femoral Swan-Ganz catheter and intra-aortic balloon pump catheter. EXAM:  PORTABLE CHEST 1 VIEW 4:14 p.m.: COMPARISON:  Portable chest x-ray earlier today 3:37 p.m. and previously. FINDINGS: Femoral Swan-Ganz catheter tip projects at the expected location of the right middle lobe pulmonary artery. Intra-aortic balloon pump catheter tip projects over the proximal descending thoracic aorta. Endotracheal tube tip now projects approximately 2-3 cm above the carina. Right jugular central venous catheter tip projects over the upper SVC. Left jugular central venous catheter tip projects over the lower SVC. Nasogastric tube courses below the diaphragm into  the stomach. Improved aeration in the left lower lobe since the most recent examination 45 minutes earlier, though streaky opacities persist. Improved perihilar airspace pulmonary edema in both lungs. No new pulmonary parenchymal abnormalities. IMPRESSION: 1. Swan-Ganz catheter tip projects at the expected location of the right middle lobe pulmonary artery. 2. Intra-aortic balloon pump catheter tip projects over the proximal descending thoracic aorta. 3. Remaining support apparatus satisfactory. 4. Improved aeration in both lungs since the examination performed 45 minutes ago, although streaky left lower lobe atelectasis and/or pneumonia and mild perihilar airspace pulmonary edema persists. 5. No new abnormalities. Electronically Signed   By: Evangeline Dakin M.D.   On: 08/18/2016 16:43   Dg Chest Port 1 View  Result Date: 08/18/2016 CLINICAL DATA:  ST-elevation myocardial infarction and cardiogenic shock. EXAM: PORTABLE CHEST 1 VIEW COMPARISON:  Yesterday FINDINGS: Endotracheal tube tip 1 cm above the carina. Central line on the right and dialysis catheter on the left with tips at the lower SVC. Orogastric tube reaches the stomach at least. Stable borderline heart size. Worsening aeration at the left base with newly obscured diaphragm. Perihilar airspace disease. IMPRESSION: 1. Lower endotracheal tube with tip 1 cm above the carina. 2. New  atelectasis or consolidation in the left lower lobe with small effusion. 3. Pulmonary edema. Electronically Signed   By: Monte Fantasia M.D.   On: 08/18/2016 07:02   Dg Chest Port 1 View  Result Date: 08/17/2016 CLINICAL DATA:  Central line placement EXAM: PORTABLE CHEST 1 VIEW COMPARISON:  Earlier same day FINDINGS: Endotracheal tube is been withdrawn with the tip 2.5 cm proximal to the carina. Right internal jugular central line remains in place with its tip in the SVC above the right atrium. New dual-lumen left internal jugular central line has its tip in the SVC above the right atrium. No pneumothorax. Bilateral bat wing airspace filling is seen consistent with acute edema or acute airspace filling due to other etiologies. No effusions. No collapse. IMPRESSION: Left IJ central line well position with tip in the SVC above the right atrium. No pneumothorax. Endotracheal tube repositioned, well positioned with tip 2.5 cm above the carina. Persistent worsen bat wing edema or airspace filling pattern. Electronically Signed   By: Nelson Chimes M.D.   On: 08/17/2016 10:35   Dg Chest Port 1 View  Result Date: 08/15/2016 CLINICAL DATA:  Central line placement EXAM: PORTABLE CHEST 1 VIEW COMPARISON:  August 15, 2016 7:04 p.m. FINDINGS: Right central venous line is identified with distal tip in superior vena cava. There is no pneumothorax. The heart size mildly enlarged. Diffuse airspace opacities identified throughout both lungs. The bones are unchanged. IMPRESSION: Right central venous line distal tip in the superior vena cava. There is no pneumothorax. Pulmonary edema unchanged compared prior exam. Electronically Signed   By: Abelardo Diesel M.D.   On: 08/15/2016 21:10   Dg Chest Port 1v Same Day  Result Date: 08/27/2016 CLINICAL DATA:  Post MITRAL VALVE REPAIR (MVR), INTRAOPERATIVE TRANSESOPHAGEAL ECHOCARDIOGRAM, ENDOVEIN HARVEST OF GREATER SAPHENOUS VEIN, CENTRAL LINE INSERTION on 08/23/2016. H/O diabetes,  hypertension. Pt is intubated and on 24/7 dialysis. Non smoker EXAM: PORTABLE CHEST 1 VIEW COMPARISON:  08/26/2016 FINDINGS: right IJ Swan-Ganz catheter unchanged. There is also a left-sided internal jugular dialysis catheter which terminates at the mid SVC. Endotracheal tube terminates 3.3 cm above carina.Nasogastric and feeding tubes extend beyond the inferior aspect of the film. Mediastinal drain and right-sided chest tube. Cardiomegaly accentuated by AP portable technique. No pleural effusion or  pneumothorax. Persistent mild to moderate interstitial edema. Bibasilar airspace disease. Right-sided subclavian line is also unchanged. IMPRESSION: No significant change since the prior exam. Cardiomegaly with mild to moderate interstitial edema and bibasilar airspace disease. Stable support apparatus, without pneumothorax. Electronically Signed   By: [name] Miyamoto M.D.   On: 08/27/2016 09:06   Dg Abd Portable 1v  Result Date: 08/25/2016 CLINICAL DATA:  Feeding tube placement. EXAM: PORTABLE ABDOMEN - 1 VIEW COMPARISON:  08/22/2016. FINDINGS: Feeding tube noted with its tip projected over the distal stomach/ proximal duodenum. NG tube noted in stable position with tip over the stomach. Left femoral catheter noted. Right femoral sheath in stable position . Aortic balloon pump tip noted L3. No bowel distention. IMPRESSION: Interim placement of feeding tube, its tip is projected over the distal stomach/ duodenum. Remaining lines and tubes including aortic balloon pump in stable position . Electronically Signed   By: Marcello Moores  Register   On: 08/25/2016 12:50   Dg Abd Portable 1v  Result Date: 08/22/2016 CLINICAL DATA:  Balloon pump position EXAM: PORTABLE ABDOMEN - 1 VIEW COMPARISON:  Chest x-ray 08/22/2016.  KUB 08/17/2016 FINDINGS: Right femoral Swan-Ganz catheter extends above the diaphragm . Intra-aortic balloon pump marker appears overlying the L3 vertebral body in previously was in the thoracic aorta. Left  femoral venous catheter in the left iliac vein. NG tube in the stomach. Normal bowel gas pattern. IMPRESSION: Intra-aortic balloon pump marker  overlies the L3 vertebral body. Electronically Signed   By: Franchot Gallo M.D.   On: 08/22/2016 13:12   Dg Abd Portable 1v  Result Date: 08/19/2016 CLINICAL DATA:  Acidosis EXAM: PORTABLE ABDOMEN - 1 VIEW COMPARISON:  08/17/2016 FINDINGS: No dilated loops large or small bowel. NG tube in stomach. Swan-Ganz catheter noted LEFT basilar atelectasis. The kidneys are dense suggesting retained IV contrast . IMPRESSION: 1. Normal bowel-gas pattern. 2. Retained contrast in the kidneys consistent consistent with acute kidney injury. Electronically Signed   By: Suzy Bouchard M.D.   On: 08/19/2016 08:02   US Abdomen Limited Ruq  Result Date: 08/26/2016 CLINICAL DATA:  Acute onset of abnormal LFTs.  Initial encounter. EXAM: US ABDOMEN LIMITED - RIGHT UPPER QUADRANT COMPARISON:  None. FINDINGS: Gallbladder: Gallbladder wall thickening is noted, measuring up to 5 mm. Vague internal echoes are seen within the gallbladder, suggestive of underlying sludge. No definite stones are identified. No pericholecystic fluid is seen. Evaluation for ultrasonographic Murphy's sign is not possible, as the patient is sedated and on ventilation. Common bile duct: Diameter: 0.5 cm, within normal limits in caliber. Liver: No focal lesion identified. Within normal limits in parenchymal echogenicity. The left hepatic lobe is not assessed as the patient has overlying bandages, due to recent CABG and mitral valve replacement. A small right-sided pleural effusion is noted. IMPRESSION: 1. Gallbladder wall thickening noted. Suggestion of underlying sludge filling the gallbladder. No definite evidence of cholelithiasis. Findings may reflect mild chronic inflammation. No pericholecystic fluid is seen to suggest acute cholecystitis. 2. Small right-sided pleural effusion noted. 3. Liver grossly unremarkable  in appearance, though the left hepatic lobe is not imaged on this study. Electronically Signed   By: Garald Balding M.D.   On: 08/26/2016 18:29    PHYSICAL EXAM General: Intubated/sedated Neck: RIJ TLC LIJ trialysis  Lungs: Clear anteriorly CV: Nondisplaced PMI.  Heart regular S1/S2, no S3/S4, 3/6 HSM apex.  Trace  peripheral edema.   Abdomen: Soft, no hepatosplenomegaly, no distention.  Neurologic: Sedated.   Extremities: No clubbing or cyanosis.  Right groin site w/o hematoma. GU: Foley    TELEMETRY: AV pacing at 100. Underlying 90 NSR with lon 1st degree AV block   Assessment:   1. Cardiogenic shock: Due to acute severe MR.  2. CAD: Inferior posterior STEMI on 9/13 with DES x 2 to OM-1      --Plavix restarted on 9/22 3. 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     --s/p MVR 4. AKI on CKD stage III - due to ATN likely from cardiogenic shock + contrast with cath. Has required CVVH.  5. Acute respiratory failure: Pulmonary edema.  6. DM2 7. Suspect component of septic shock: Possible PNA, group B Strep in sputum culture.  8. Anemia 9. PVCs   10. 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 S/P MVR 08/23/16  Remains on triple pressors with good MAPs. Norepi being weaned but co-ox low.   I reviewed echo from yesterday RV and LV are normal (EF 60%) but appeared underfilled    LFTs now improving. Suspect probable shock liver. Though unclear.  Amio stopped. RUQ ok. Hepatitis panels negative Also need to consider transfusion reaction/infection  Continues with significant ectopy. Lido back at 2 and being overdrive paced. I turned pacer down to 70 and now conducting on her own. Turn lido to 1.   Back on Plavix with recent stent.   Unable to wean  from vent will likely need trach. CCM managing   WBC persistently elevated. Swan and Foley out yesterday. Completed Rocephin. [place] drawn 9/24. Will start Vanc/zosyn. Need to change lines today. Will d/w CCM. May need fungal coverage.   Overall she appears much sicker than her cardiac status would suggest. Prognosis guarded.  The patient is critically ill with multiple organ systems failure and requires high complexity decision making for assessment and support, frequent evaluation and titration of therapies, application of advanced monitoring technologies and extensive interpretation of multiple databases.   Critical Care Time devoted to patient care services described in this note is 45 Minutes.  Laqueta Bonaventura,MD 6:55 AM

## 2016-08-28 NOTE — Consult Note (Signed)
[facility] NOTE   MRN : [id number]  Reason for Consult: acute kidney failure s/p CABG and stent Referring Physician: Dr. Jimmy Footman  Patient intubated and sedated history taken from current chart  We are consulted for catheter placement for HD, she has a temp catheter.  She is not on anticoagulation.  Heparin while on CRRT.  WBC in 30.1.  ON Levophed and Epinephrine.   History of Present Illness: 60 y/o woman with DM2, HTN, HL and noncompliance presented to [facility] ER with infrerior posterior STEMI on 9/12. Taken to cath lab and found to have occluded small to moderate OM-1 branch. Underwent successful PCS with stent x 2 by Dr. Clayborn Bigness. Coronaries otherwise ok. EF 55-60% with severe MR confirmed by echo.    Post-cath developed respiratory failure and required intubation. Developed shock and renal failure with peak creatinine 3.4 (1.6 on admit). Trialysis catheter placed.   Patient persistently acidotic with concern for ischemic MR and transferred here. En route develop severe hypotension and norepinephrine started.   Brought to cath lab 9/15 and TEE showed normal LV (EF 60-65%) and normal RV function with ruptured posterior papillary muscle with severe posterior MR.  Underwent placement of Swan and IABP.   S/P MVR 08/23/16.  IABP pulled 9/22.   Remains intubated. Wakes up intermittently but not following commands. CCM unable to wean vent due to respiratory pattern and apneic episodes. Checking ammonia and ABG.   Remains on epi 3 norepi 30 and milrinone 0.25. MAPs 65-70. PA pressures ok. CVVHD pulling 100/hr. Weight down 24 pounds in 2 days. Amio stopped after LFTs went up, Now coming back down. RUQ u/s ok.  Anuric. Co-ox up slightly to 51% after increasing norepi. 25->30. Still with frequent ectopy. Did not tolerate lido wean. Now on 2/min and AV pacing at 100 to override.   Current Facility-Administered Medications  Medication Dose Route Frequency  Provider Last Rate Last Dose  . 0.45 % sodium chloride infusion   Intravenous Continuous PRN Elgie Collard, PA-C   Stopped at 08/27/16 0900  . 0.9 %  sodium chloride infusion  250 mL Intravenous Continuous Tessa N Conte, PA-C      . 0.9 %  sodium chloride infusion   Intravenous Continuous Elgie Collard, PA-C 20 mL/hr at 08/28/16 0700    . aspirin EC tablet 325 mg  325 mg Oral Daily Elgie Collard, PA-C       Or  . aspirin chewable tablet 324 mg  324 mg Per Tube Daily Elgie Collard, PA-C   324 mg at 08/27/16 1015  . chlorhexidine gluconate (MEDLINE KIT) (PERIDEX) 0.12 % solution 15 mL  15 mL Mouth Rinse BID Erick Colace, NP   15 mL at 08/27/16 1935  . clopidogrel (PLAVIX) tablet 75 mg  75 mg Oral Daily Jolaine Artist, MD   75 mg at 08/27/16 1016  . dexmedetomidine (PRECEDEX) 400 MCG/100ML (4 mcg/mL) infusion  0-0.7 mcg/kg/hr Intravenous Titrated Ivin Poot, MD 9.8 mL/hr at 08/28/16 0700 0.503 mcg/kg/hr at 08/28/16 0700  . docusate (COLACE) 50 MG/5ML liquid 100 mg  100 mg Per Tube BID PRN Chesley Mires, MD   100 mg at 08/27/16 1015  . docusate sodium (COLACE) capsule 200 mg  200 mg Oral Daily Tessa N Conte, PA-C      . EPINEPHrine (ADRENALIN) 4 mg in dextrose 5 % 250 mL (0.016 mg/mL) infusion  3 mcg/min Intravenous Titrated Ivin Poot, MD 11.3 mL/hr at  08/28/16 0700 3 mcg/min at 08/28/16 0700  . famotidine (PEPCID) IVPB 20 mg premix  20 mg Intravenous Q24H Javier Glazier, MD   20 mg at 08/27/16 2255  . feeding supplement (NEPRO CARB STEADY) liquid 1,000 mL  1,000 mL Per Tube Continuous Gaye Pollack, MD 20 mL/hr at 08/28/16 0700 1,000 mL at 08/28/16 0700  . fentaNYL (SUBLIMAZE) injection 25-100 mcg  25-100 mcg Intravenous Q1H PRN Javier Glazier, MD   100 mcg at 08/28/16 0526  . heparin 10,000 units/ 20 mL infusion syringe  250-3,000 Units/hr CRRT Continuous Roney Jaffe, MD 2.4 mL/hr at 08/28/16 0440 1,200 Units/hr at 08/28/16 0440  . heparin bolus via infusion syringe 1,000  Units  1,000 Units CRRT PRN Roney Jaffe, MD      . heparin injection 1,000-6,000 Units  1,000-6,000 Units CRRT PRN Jamal Maes, MD   2,300 Units at 08/23/16 1644  . insulin aspart (novoLOG) injection 0-24 Units  0-24 Units Subcutaneous Q4H Ivin Poot, MD   8 Units at 08/28/16 0430  . insulin detemir (LEVEMIR) injection 10 Units  10 Units Subcutaneous BID Ivin Poot, MD   10 Units at 08/27/16 2255  . lactated ringers infusion 500 mL  500 mL Intravenous Once PRN Elgie Collard, PA-C      . lactated ringers infusion   Intravenous Continuous Elgie Collard, PA-C      . lactated ringers infusion   Intravenous Continuous Elgie Collard, PA-C      . lidocaine (cardiac) IV  infusion 4 mg/mL  1 mg/min Intravenous Titrated Jolaine Artist, MD 15 mL/hr at 08/28/16 0708 1 mg/min at 08/28/16 0708  . MEDLINE mouth rinse  15 mL Mouth Rinse 10 times per day Erick Colace, NP   15 mL at 08/28/16 0441  . midazolam (VERSED) injection 1-2 mg  1-2 mg Intravenous Q4H PRN Juanito Doom, MD   2 mg at 08/28/16 0441  . milrinone (PRIMACOR) 20 MG/100 ML (0.2 mg/mL) infusion  0.3 mcg/kg/min Intravenous Continuous Elgie Collard, PA-C 5.9 mL/hr at 08/28/16 0700 0.252 mcg/kg/min at 08/28/16 0700  . norepinephrine (LEVOPHED) 16 mg in dextrose 5 % 250 mL (0.064 mg/mL) infusion  0-40 mcg/min Intravenous Titrated Ivin Poot, MD 11.3 mL/hr at 08/28/16 0700 12 mcg/min at 08/28/16 0700  . ondansetron (ZOFRAN) injection 4 mg  4 mg Intravenous Q6H PRN Satira Mccallum Tillery, PA-C      . ondansetron [facility]) injection 4 mg  4 mg Intravenous Q6H PRN Elgie Collard, PA-C      . piperacillin-tazobactam (ZOSYN) IVPB 3.375 g  3.375 g Intravenous Once Laren Everts, RPH   3.375 g at 08/28/16 0747  . piperacillin-tazobactam (ZOSYN) IVPB 3.375 g  3.375 g Intravenous Q8H Veronda P Bryk, RPH      . prismasol BGK 4/2.5 5,000 mL dialysis replacement fluid   CRRT Continuous Jamal Maes, MD 600 mL/hr at 08/28/16 0128    .  prismasol BGK 4/2.5 5,000 mL dialysis replacement fluid   CRRT Continuous Jamal Maes, MD 200 mL/hr at 08/27/16 2113    . prismasol BGK 4/2.5 5,000 mL dialysis solution   CRRT Continuous Jamal Maes, MD 1,000 mL/hr at 08/28/16 0747    . sodium chloride 0.9 % primer fluid for CRRT   CRRT PRN Jamal Maes, MD      . sodium chloride flush (NS) 0.9 % injection 10-40 mL  10-40 mL Intracatheter PRN Erick Colace, NP      .  sodium chloride flush (NS) 0.9 % injection 3 mL  3 mL Intravenous Q12H Elgie Collard, PA-C   3 mL at 08/27/16 2310  . sodium chloride flush (NS) 0.9 % injection 3 mL  3 mL Intravenous PRN Elgie Collard, PA-C      . sodium phosphate 30 mmol in dextrose 5 % 250 mL infusion  30 mmol Intravenous Once Mauricia Area, MD   30 mmol at 08/28/16 6948  . vancomycin (VANCOCIN) IVPB 1000 mg/200 mL premix  1,000 mg Intravenous Q24H Laren Everts, RPH        Pt meds include: Statin :No Betablocker: No ASA: No Other anticoagulants/antiplatelets: Heparin for CRRT  Past Medical History:  Diagnosis Date  . Diabetes mellitus   . Hyperlipidemia   . Hypertension     Past Surgical History:  Procedure Laterality Date  . CARDIAC CATHETERIZATION N/A 08/15/2016   Procedure: Left Heart Cath and Coronary Angiography;  Surgeon: Yolonda Kida, MD;  Location: [place] [facility];  Service: Cardiovascular;  Laterality: N/A;  . CARDIAC CATHETERIZATION N/A 08/15/2016   Procedure: Coronary Stent Intervention;  Surgeon: Yolonda Kida, MD;  Location: [place] [facility];  Service: Cardiovascular;  Laterality: N/A;  . CARDIAC CATHETERIZATION N/A 08/18/2016   Procedure: Right Heart Cath;  Surgeon: Jolaine Artist, MD;  Location: [place] [facility];  Service: Cardiovascular;  Laterality: N/A;  . CARDIAC CATHETERIZATION N/A 08/18/2016   Procedure: IABP Insertion;  Surgeon: Jolaine Artist, MD;  Location: [place] [facility];  Service: Cardiovascular;  Laterality: N/A;  . CARDIAC  CATHETERIZATION Right 08/23/2016   Procedure: CENTRAL LINE INSERTION RIGHT SUBCLAVIAN;  Surgeon: Ivin Poot, MD;  Location: [place];  Service: Open Heart Surgery;  Laterality: Right;  . ENDOVEIN HARVEST OF GREATER SAPHENOUS VEIN Left 08/23/2016   Procedure: ENDOVEIN HARVEST OF GREATER SAPHENOUS VEIN;  Surgeon: Ivin Poot, MD;  Location: [place];  Service: Open Heart Surgery;  Laterality: Left;  . INTRAOPERATIVE TRANSESOPHAGEAL ECHOCARDIOGRAM N/A 08/23/2016   Procedure: INTRAOPERATIVE TRANSESOPHAGEAL ECHOCARDIOGRAM;  Surgeon: Ivin Poot, MD;  Location: [place];  Service: Open Heart Surgery;  Laterality: N/A;  . MITRAL VALVE REPAIR N/A 08/23/2016   Procedure: MITRAL VALVE REPAIR (MVR) USING 25MM EDWARDS MAGNA EASE BIOPROSTHESIS MITRAL  VALVE;  Surgeon: Ivin Poot, MD;  Location: [place];  Service: Open Heart Surgery;  Laterality: N/A;  . VAGINAL DELIVERY     x 6    Social History Social History  Substance Use Topics  . Smoking status: Never Smoker  . Smokeless tobacco: Never Used  . Alcohol use No    Family History Family History  Problem Relation Age of Onset  . Hypertension Mother   . Diabetes Mother   . Breast cancer Sister     No Known Allergies   REVIEW OF SYSTEMS  General: [ ]  Weight loss, [ ]  Fever, [ ]  chills [x]  sedated and intubated Neurologic: [ ]  Dizziness, [ ]  Blackouts, [ ]  Seizure [ ]  Stroke, [ ]  "Mini stroke", [ ]  Slurred speech, [ ]  Temporary blindness; [ ]  weakness in arms or legs, [ ]  Hoarseness [ ]  Dysphagia Cardiac: [ ]  Chest pain/pressure, [ ]  Shortness of breath at rest [ ]  Shortness of breath with exertion, [ ]  Atrial fibrillation or irregular heartbeat  Vascular: [ ]  Pain in legs with walking, [ ]  Pain in legs at rest, [ ]  Pain in legs at night,  [ ]  Non-healing ulcer, [ ]  Blood clot  in vein/DVT,   Pulmonary: [ ]  Home oxygen, [ ]  Productive cough, [ ]  Coughing up blood, [ ]  Asthma,  [ ]  Wheezing [ ]  COPD Musculoskeletal:  [ ]  Arthritis, [ ]  Low  back pain, [ ]  Joint pain Hematologic: [ ]  Easy Bruising, [ ]  Anemia; [ ]  Hepatitis Gastrointestinal: [ ]  Blood in stool, [ ]  Gastroesophageal Reflux/heartburn, Urinary: [ ]  chronic Kidney disease, [ ]  on HD - [ ]  MWF or [ ]  TTHS, [ ]  Burning with urination, [ ]  Difficulty urinating Skin: [ ]  Rashes, [ ]  Wounds Psychological: [ ]  Anxiety, [ ]  Depression  Physical Examination Vitals:   08/28/16 0545 08/28/16 0600 08/28/16 0615 08/28/16 0700  BP:  116/79  103/67  Pulse:      Resp: 17 14 16 10   Temp:      TempSrc:      SpO2:      Weight:      Height:       Body mass index is 31.24 kg/m.  General: Sedated and intubated Eyes: Pupils equal Pulmonary: ventilator suported Cardiac: RRR, Abdomen: soft, NT, no masses Skin: no rashes, ulcers noted;  no Gangrene , no cellulitis; no open wounds;   Vascular Exam/Pulses:Doppler right PT/DP biphasic, left DP, radial 3+ bilaterally palpable Balloon in right was placed right common femoral artery, arterial line left femoral artery.  Left temp catheter.      Significant Diagnostic Studies: CBC Lab Results  Component Value Date   WBC 30.1 (H) 08/28/2016   HGB 8.3 (L) 08/28/2016   HCT 24.7 (L) 08/28/2016   MCV 85.8 08/28/2016   PLT 148 (L) 08/28/2016    BMET    Component Value Date/Time   NA 130 (L) 08/28/2016 0429   K 4.6 08/28/2016 0429   CL 99 (L) 08/28/2016 0429   CO2 24 08/28/2016 0429   GLUCOSE 216 (H) 08/28/2016 0429   BUN 18 08/28/2016 0429   CREATININE 1.57 (H) 08/28/2016 0429   CREATININE 1.41 (H) 10/04/2012 1631   CALCIUM 7.4 (L) 08/28/2016 0429   GFRNONAA 35 (L) 08/28/2016 0429   GFRAA 41 (L) 08/28/2016 0429   Estimated Creatinine Clearance: 35.8 mL/min (by C-G formula based on SCr of 1.57 mg/dL (H)).  COAG Lab Results  Component Value Date   INR 1.50 08/23/2016   INR 1.06 08/15/2016   INR 1.11 01/23/2010     Non-Invasive Vascular Imaging:   ASSESSMENT/PLAN:  Acute Kidney failure s/p MI, stent  placement then CABG Currently on CRRT with temp cath.  Elevated WBC not on anticoagulation. ON IV Zosyn, Vancomycin, Levophed 12 mg and Epinephrine.     Laurence Slate [facility] 08/28/2016 7:54 AM I have examined the patient, reviewed and agree with above.Critically ill patient. Currently dialysis via left IJ temporary catheter placed 10 days ago at [facility]. Discussed withDr Prescott Gum at the bedside currently. Not currently stable enough for transport to the operating room. Is having successful dialysis although catheter has been present for 10 days. Available for tunneled catheter when medically stable. Will follow  Curt Jews, MD 08/28/2016 2:24 PM

## 2016-08-28 NOTE — Progress Notes (Signed)
PULMONARY / CRITICAL CARE MEDICINE   Name: Deborah [name] MRN: [id number] DOB: 25-Jan-1956    ADMISSION DATE:  08/18/2016 CONSULTATION DATE:  08/18/2016  REFERRING MD:  Bensimhon   CHIEF COMPLAINT:  Ventilator management and critical care services   HISTORY OF PRESENT ILLNESS:   60 yo female admitted to [facility] 08/15/16 with STEMI s/p cath to [place].  Developed cardiogenic shock, VDRF (ETT 9/14), AKI with metabolic acidosis and started on CRRT 08/17/16.  Transferred to [facility] to tx shock and evaluated new severe MR from papillary muscle rupture.  She also had fever and found to have Group B Strep in sputum culture. Mitral valve replacement 9/20.  SUBJECTIVE:  Remains on vent WBC remains elevated, afebrile On epi and levophed drips.  Tolerating CVVH  VITAL SIGNS: BP (!) 177/92   Pulse 95   Temp (!) 96.9 F (36.1 C) (Axillary)   Resp 16   Ht 5\' 1"  (1.549 m) Comment: from 08/15/16 encounter  Wt 75 kg (165 lb 5.5 oz)   SpO2 100%   BMI 31.24 kg/m   HEMODYNAMICS: CVP:  [8 mmHg] 8 mmHg  VENTILATOR SETTINGS: Vent Mode: PRVC FiO2 (%):  [40 %] 40 % Set Rate:  [16 bmp] 16 bmp Vt Set:  [400 mL] 400 mL PEEP:  [5 cmH20] 5 cmH20 Plateau Pressure:  [9 cmH20-20 cmH20] 15 cmH20  INTAKE / OUTPUT: I/O last 3 completed shifts: In: 4356.9 [I.V.:3637.2; NG/GT:539.7; IV Piggyback:180] Out: 1017 [Urine:20; Emesis/NG output:600; PZWCH:8527; Chest Tube:65]  PHYSICAL EXAMINATION: General: Sedated on vent. NAD HENT: NCAT ETT in place PULM: fair ae. Dec BS bases with crackles at bases CV: Paced rhythm, slight systolic murmur, mediastinal drains in place GI: minimal bowel sounds. Soft. (-) masses/tenderness Derm: edema arms/legs Neuro: sedated on vent. (-) lateralizing signs noted.   LABS:  BMET  Recent Labs Lab 08/27/16 0321 08/27/16 1635 08/27/16 1647 08/28/16 0429  NA 132* 134* 131* 130*  K 4.9 4.8 4.8 4.6  CL 100* 98* 100* 99*  CO2 23  --  23 24  BUN 17 18 18 18   CREATININE 1.77* 1.80*  1.65* 1.57*  GLUCOSE 141* 177* 179* 216*    Electrolytes  Recent Labs Lab 08/26/16 0430  08/27/16 0321 08/27/16 1647 08/28/16 0429  CALCIUM 7.9*  < > 7.6* 7.6* 7.4*  MG 2.4  --  2.3  --  2.3  PHOS  --   < > 2.9 2.4* 2.4*  < > = values in this interval not displayed.  CBC  Recent Labs Lab 08/26/16 0430  08/27/16 0321 08/27/16 1635 08/28/16 0429  WBC 27.5*  --  27.8*  --  30.1*  HGB 9.2*  < > 8.8* 9.5* 8.3*  HCT 26.8*  < > 25.8* 28.0* 24.7*  PLT 156  --  148*  --  148*  < > = values in this interval not displayed.  Coag's  Recent Labs Lab 08/23/16 1553 08/27/16 0321 08/28/16 0429  APTT 40* 123* 156*  INR 1.50  --   --     Sepsis Markers No results for input(s): LATICACIDVEN, PROCALCITON, O2SATVEN in the last 168 hours.  ABG  Recent Labs Lab 08/27/16 0310 08/27/16 0822 08/27/16 1636  PHART 7.440 7.467* 7.430  PCO2ART 35.8 33.9 36.8  PO2ART 91.3 86.0 109.0*    Liver Enzymes  Recent Labs Lab 08/26/16 0430  08/27/16 0321 08/27/16 1647 08/28/16 0429  AST 1,011*  --  694*  --  299*  ALT 541*  --  532*  --  387*  ALKPHOS 157*  --  193*  --  191*  BILITOT 0.8  --  0.8  --  0.9  ALBUMIN 2.0*  < > 1.9* 1.9* 1.9*  < > = values in this interval not displayed.  Cardiac Enzymes No results for input(s): TROPONINI, PROBNP in the last 168 hours.  Glucose  Recent Labs Lab 08/26/16 2332 08/27/16 1143 08/27/16 1541 08/27/16 2005 08/28/16 0022 08/28/16 0801  GLUCAP 127* 176* 143* 170* 193* 178*    Imaging Dg Chest Port 1 View  Result Date: 08/27/2016 CLINICAL DATA:  60 year old female with a history of mitral valve repair EXAM: PORTABLE CHEST 1 VIEW COMPARISON:  08/27/2016, 08/26/2016, 08/25/2016 FINDINGS: Cardiomediastinal silhouette unchanged. Surgical changes of prior median sternotomy and mitral valve annuloplasty. Unchanged position of right-sided thoracostomy tube, gastric tube, right subclavian central line. Unchanged position of the  endotracheal tube. Unchanged left-sided IJ central venous catheter. Unchanged position of mediastinal drain and epicardial pacing leads. Interval removal of right IJ sheath and Swan-Ganz catheter. Minimal interstitial airspace opacities at the lung bases. Blunting of left costophrenic angle with pleural parenchymal thickening. No pneumothorax. IMPRESSION: Interval removal of right IJ sheath and Swan-Ganz catheter. Mild airspace disease at the bases with likely trace left pleural effusion. Unchanged endotracheal tube, gastric tube, right subclavian central line, left IJ catheter, mediastinal/pleural drains, and epicardial pacing leads. Surgical changes of median sternotomy and mitral valve annuloplasty. Signed, Dulcy Fanny. Earleen Newport, DO [facility] Electronically Signed   By: Corrie Mckusick D.O.   On: 08/27/2016 17:12    STUDIES:  LHC 9/12: PCI to OM1, severe MR, EF 55 to 60% RHC 9/15: RA 5, RV 37/3/6, PA 38/18/26, PCWP 15, CI 1.9, PVR 3.3 WU TEE 9/15: EF 60 to 65%, flail motion MV with severe MR Port CXR 9/21:  Tubes in lines in good position. Hazy bilateral opacities. Port CXR 9/23: Lines, tubes, and endotracheal tube in good position. Persistent pulmonary vascular congestion and effusions RUQ ultrasound 9/23 > Gallbladder wall thickening noted, suggestion of sludge, small R effusion, liver unremarkable but left lobe not well visulalized  MICROBIOLOGY: MRSA PCR 9/12:  Negative MRSA PCR 9/15:  Negative Tracheal Asp Ctx 9/15:  Strep agalactiae  Blood Ctx x2 9/15:  Negative  MRSA PCR 9/19:  Negative   ANTIBIOTICS: Zosyn 9/16 - 9/17 Vancomycin 9/16 - 9/17 Rocephin 9/17 >> 9/24 Vancomycin 9/20 (surgical prophylaxis) Vanc 9/25 > Zosyn 9/25 >   SIGNIFICANT EVENTS: 9/12 Admit to [facility], PCI to [place] 9/14 ETT, CRRT 9/15 to [facility] 9/16 off insulin gtt 9/20 To OR for Mitral Valve Repair w/ bioprosthetic valve  LINES/TUBES: Right IJ PICC 9/12 -  9/20 Rt femoral introducer 9/15 - 9/20 OETT 8.0 9/14 >>  HD cath left IJ 9/14 >> R IJ Introducer/CVL/Swan 9/20 >> IABP 9/15 >>9/22 Lt femoral a line 9/15 >>  Y Chest Tube/Mediastinal Chest Tube 9/20 >> PIV x1  ASSESSMENT / PLAN:  PULMONARY A: Acute Hypoxic Respiratory Failure - Secondary to pulmonary edema > failed SBT 9/24 Chest/Mediastinal Tubes Abnormal respiratory pattern 9/24 P:   Cont vent support. Daily PST as tolerated. Noted plans to go back to OR in am to have a perm cath and remove femoral arterial line. I do not think she will be ready to be extubated today 2/2 volume issues, still on drips, and she is going back to OR in am.  VAP prevention bundle Chest tube Mgt per CVTS   CARDIOVASCULAR A:  Inferior/posterior MI Severe  MR w/ ruptured posterior papillary muscle Shock - Combination cardiogenic & septic  P:  Continuous Telemetry Monitoring Vitals per unit protocol PA catheter per CVTS/cardiology Post-op Mgt per CVTS/Cardiology ASA, & Lopressor ordered  Epinephrine, Levophed, Nitro, & Primacor gtt's per cardiology/CVTS   RENAL A:   Acute Renal Failure - Baseline creatinine 1.6-1.7 Hyponatremia - Mild  P:   Nephrology Following Continue volume removal per CVVHD > plan to remove HD cath on 9/26 and will get a perm cath then Monitor BMET and UOP Replace electrolytes as needed  GASTROINTESTINAL A:   Transaminitis - improving, amio or statin effect? Mild Malnutrition - Related to critical illness  P:   Pepcid IV q12hr Cont TF Trend LFTs daily Amiodarone and Statins on hold due to LFT abnormality   HEMATOLOGIC A:   Anemia - Hgb worsening. No signs of active bleeding. Last transfusion 9/20 Leukocytosis - Multifactorial.  P:  Trending cell counts w/ CBC SCDs  INFECTIOUS A:   Group B Strep Pneumonia Leukocytosis P:   WBC elevated. Low grade fever. Vanc and zosyn restarted on 9/25 and Fluconazole started on 9/25.  Check PCT.  Plan to change  lines on 9/26.  Plan to re-culture for fever  ENDOCRINE A:   DM - Glucose controlled  P:   Subcutaneous insulin per CVTS Levemir BID  NEUROLOGIC A:   Acute Encephalopathy - Multifactorial and likely combination toxic metabolic & hypoxia  P:   RASS goal: -1 Precedex gtt  Versed IV prn Sedation > lower dose Fentanyl IV prn   No family at bedside.  Critical care time with this patient today : 30 minutes.    Monica Becton, MD 08/28/2016, 9:07 AM [place] [facility] Pager [phone] After 3 pm or if no answer, call [phone]

## 2016-08-28 NOTE — Progress Notes (Signed)
5 Days Post-Op Procedure(s) (LRB): MITRAL VALVE REPAIR (MVR) USING 25MM EDWARDS MAGNA EASE BIOPROSTHESIS MITRAL  VALVE (N/A) INTRAOPERATIVE TRANSESOPHAGEAL ECHOCARDIOGRAM (N/A) ENDOVEIN HARVEST OF GREATER SAPHENOUS VEIN (Left) CENTRAL LINE INSERTION RIGHT SUBCLAVIAN (Right) Subjective: Low Co-ox with anemia- will give one unit Wbc to 30 k- antibiotics increased  Objective: Vital signs in last 24 hours: Temp:  [96.9 F (36.1 C)-99.6 F (37.6 C)] 96.9 F (36.1 C) (09/25 0803) Pulse Rate:  [52-101] 92 (09/25 0800) Cardiac Rhythm: A-V Sequential paced (09/25 0430) Resp:  [8-28] 14 (09/25 0800) BP: (76-148)/(56-89) 101/66 (09/25 0800) SpO2:  [97 %-100 %] 100 % (09/25 0800) Arterial Line BP: (90-171)/(50-106) 114/56 (09/25 0800) FiO2 (%):  [40 %] 40 % (09/25 0800)  Hemodynamic parameters for last 24 hours:  stable on pressors  Intake/Output from previous day: 09/24 0701 - 09/25 0700 In: 2829.9 [I.V.:2369.9; NG/GT:430; IV Piggyback:30] Out: 2010 [Emesis/NG output:300; Chest Tube:20] Intake/Output this shift: Total I/O In: 43 [IV Piggyback:43] Out: -        Exam    General- sedated   Lungs- clear without rales, wheezes   Cor- regular rate and rhythm, no murmur , gallop   Abdomen- soft, non-tender   Extremities - warm, non-tender, minimal edema   Neuro- oriented, appropriate, no focal weakness   Lab Results:  Recent Labs  08/27/16 0321 08/27/16 1635 08/28/16 0429  WBC 27.8*  --  30.1*  HGB 8.8* 9.5* 8.3*  HCT 25.8* 28.0* 24.7*  PLT 148*  --  148*   BMET:  Recent Labs  08/27/16 1647 08/28/16 0429  NA 131* 130*  K 4.8 4.6  CL 100* 99*  CO2 23 24  GLUCOSE 179* 216*  BUN 18 18  CREATININE 1.65* 1.57*  CALCIUM 7.6* 7.4*    PT/INR: No results for input(s): LABPROT, INR in the last 72 hours. ABG    Component Value Date/Time   PHART 7.430 08/27/2016 1636   HCO3 24.3 08/27/2016 1636   TCO2 25 08/27/2016 1636   ACIDBASEDEF 3.0 (H) 08/26/2016 1615   O2SAT  51.5 08/28/2016 0618   CBG (last 3)   Recent Labs  08/27/16 2005 08/28/16 0022 08/28/16 0801  GLUCAP 170* 193* 178*    Assessment/Plan: S/P Procedure(s) (LRB): MITRAL VALVE REPAIR (MVR) USING 25MM EDWARDS MAGNA EASE BIOPROSTHESIS MITRAL  VALVE (N/A) INTRAOPERATIVE TRANSESOPHAGEAL ECHOCARDIOGRAM (N/A) ENDOVEIN HARVEST OF GREATER SAPHENOUS VEIN (Left) CENTRAL LINE INSERTION RIGHT SUBCLAVIAN (Right) Elevated WEBC- get fem a-line out, 70 days old   LOS: 10 days    Deborah [name] 08/28/2016

## 2016-08-28 NOTE — Progress Notes (Signed)
Subjective: Interval History: sedated on vent, still on pressors, .  Objective: Vital signs in last 24 hours: Temp:  [97.3 F (36.3 C)-99.6 F (37.6 C)] 97.3 F (36.3 C) (09/25 0400) Pulse Rate:  [52-101] 100 (09/25 0500) Resp:  [8-28] 16 (09/25 0615) BP: (76-148)/(55-89) 116/79 (09/25 0600) SpO2:  [97 %-100 %] 100 % (09/25 0500) Arterial Line BP: (90-171)/(41-106) 120/62 (09/25 0615) FiO2 (%):  [40 %] 40 % (09/25 0430) Weight change:   Intake/Output from previous day: 09/24 0701 - 09/25 0700 In: 2722.6 [I.V.:2282.6; NG/GT:410; IV Piggyback:30] Out: 4450 [Emesis/NG output:300; Chest Tube:20] Intake/Output this shift: No intake/output data recorded.  General appearance: uncooperative and entub, sedated Neck: LIJ cath Resp: diminished breath sounds bibasilar and rales bibasilar Cardio: S1, S2 normal and systolic murmur: holosystolic 2/6, blowing at apex GI: pos bs,mod distension Extremities: edema 2-3+  Lab Results:  Recent Labs  08/27/16 0321 08/27/16 1635 08/28/16 0429  WBC 27.8*  --  30.1*  HGB 8.8* 9.5* 8.3*  HCT 25.8* 28.0* 24.7*  PLT 148*  --  148*   BMET:  Recent Labs  08/27/16 1647 08/28/16 0429  NA 131* 130*  K 4.8 4.6  CL 100* 99*  CO2 23 24  GLUCOSE 179* 216*  BUN 18 18  CREATININE 1.65* 1.57*  CALCIUM 7.6* 7.4*   No results for input(s): PTH in the last 72 hours. Iron Studies: No results for input(s): IRON, TIBC, TRANSFERRIN, FERRITIN in the last 72 hours.  Studies/Results: Dg Chest Port 1 View  Result Date: 08/27/2016 CLINICAL DATA:  60 year old female with a history of mitral valve repair EXAM: PORTABLE CHEST 1 VIEW COMPARISON:  08/27/2016, 08/26/2016, 08/25/2016 FINDINGS: Cardiomediastinal silhouette unchanged. Surgical changes of prior median sternotomy and mitral valve annuloplasty. Unchanged position of right-sided thoracostomy tube, gastric tube, right subclavian central line. Unchanged position of the endotracheal tube. Unchanged  left-sided IJ central venous catheter. Unchanged position of mediastinal drain and epicardial pacing leads. Interval removal of right IJ sheath and Swan-Ganz catheter. Minimal interstitial airspace opacities at the lung bases. Blunting of left costophrenic angle with pleural parenchymal thickening. No pneumothorax. IMPRESSION: Interval removal of right IJ sheath and Swan-Ganz catheter. Mild airspace disease at the bases with likely trace left pleural effusion. Unchanged endotracheal tube, gastric tube, right subclavian central line, left IJ catheter, mediastinal/pleural drains, and epicardial pacing leads. Surgical changes of median sternotomy and mitral valve annuloplasty. Signed, Dulcy Fanny. Earleen Newport, DO [facility]iology Electronically Signed   By: Corrie Mckusick D.O.   On: 08/27/2016 17:12   Dg Chest Port 1v Same Day  Result Date: 08/27/2016 CLINICAL DATA:  Post MITRAL VALVE REPAIR (MVR), INTRAOPERATIVE TRANSESOPHAGEAL ECHOCARDIOGRAM, ENDOVEIN HARVEST OF GREATER SAPHENOUS VEIN, CENTRAL LINE INSERTION on 08/23/2016. H/O diabetes, hypertension. Pt is intubated and on 24/7 dialysis. Non smoker EXAM: PORTABLE CHEST 1 VIEW COMPARISON:  08/26/2016 FINDINGS: right IJ Swan-Ganz catheter unchanged. There is also a left-sided internal jugular dialysis catheter which terminates at the mid SVC. Endotracheal tube terminates 3.3 cm above carina.Nasogastric and feeding tubes extend beyond the inferior aspect of the film. Mediastinal drain and right-sided chest tube. Cardiomegaly accentuated by AP portable technique. No pleural effusion or pneumothorax. Persistent mild to moderate interstitial edema. Bibasilar airspace disease. Right-sided subclavian line is also unchanged. IMPRESSION: No significant change since the prior exam. Cardiomegaly with mild to moderate interstitial edema and bibasilar airspace disease. Stable support apparatus, without pneumothorax. Electronically  Signed   By: [name] Miyamoto M.D.   On: 08/27/2016 09:06  US Abdomen Limited Ruq  Result Date: 08/26/2016 CLINICAL DATA:  Acute onset of abnormal LFTs.  Initial encounter. EXAM: US ABDOMEN LIMITED - RIGHT UPPER QUADRANT COMPARISON:  None. FINDINGS: Gallbladder: Gallbladder wall thickening is noted, measuring up to 5 mm. Vague internal echoes are seen within the gallbladder, suggestive of underlying sludge. No definite stones are identified. No pericholecystic fluid is seen. Evaluation for ultrasonographic Murphy's sign is not possible, as the patient is sedated and on ventilation. Common bile duct: Diameter: 0.5 cm, within normal limits in caliber. Liver: No focal lesion identified. Within normal limits in parenchymal echogenicity. The left hepatic lobe is not assessed as the patient has overlying bandages, due to recent CABG and mitral valve replacement. A small right-sided pleural effusion is noted. IMPRESSION: 1. Gallbladder wall thickening noted. Suggestion of underlying sludge filling the gallbladder. No definite evidence of cholelithiasis. Findings may reflect mild chronic inflammation. No pericholecystic fluid is seen to suggest acute cholecystitis. 2. Small right-sided pleural effusion noted. 3. Liver grossly unremarkable in appearance, though the left hepatic lobe is not imaged on this study. Electronically Signed   By: Garald Balding M.D.   On: 08/26/2016 18:29    I have reviewed the patient's current medications.  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  CRRT going well, vent filling lower, will cut net neg, Acid/base/k ok. Phos low, bolus.  Will check CVPs 2 CAD/MVR per CV[facility] and cards 3 Low bps 4 DM controlled 5 Nutriton TF 6 VDRF 7 ^^WBC, empiric AB. P get Tem cath out, place PC, expect longer course.  Pressors, TF, AB,     LOS: 10 days   Alvin Rubano L 08/28/2016,7:08 AM

## 2016-08-28 NOTE — Progress Notes (Signed)
Pharmacy Antibiotic Note  Deborah [name] is a 60 y.o. female admitted on 08/18/2016 with STEMI w/ cardiogenic shock, now w/ persistent leukocytosis after completing course of Rocephin.  Pharmacy has been consulted for Vancocin and Zosyn dosing.  SCr still elevated but improved since earlier this admission.  Plan: Vancomycin 1000mg  IV every 24 hours.  Goal trough 15-20 mcg/mL.  Will need to watch SCr closely for dose adjustments. Zosyn 3.375g IV q8h (4 hour infusion).  Height: 5\' 1"  (154.9 cm) (from 08/15/16 encounter) Weight: 165 lb 5.5 oz (75 kg) IBW/kg (Calculated) : 47.8  Temp (24hrs), Avg:98.3 F (36.8 C), Min:97.3 F (36.3 C), Max:99.6 F (37.6 C)   Recent Labs Lab 08/24/16 1700  08/25/16 0400  08/26/16 0430  08/26/16 1618 08/27/16 0321 08/27/16 1635 08/27/16 1647 08/28/16 0429  WBC 26.7*  --  26.2*  --  27.5*  --   --  27.8*  --   --  30.1*  CREATININE 3.11*  < > 2.72*  2.60*  < > 1.99*  < > 2.20* 1.77* 1.80* 1.65* 1.57*  < > = values in this interval not displayed.  Estimated Creatinine Clearance: 35.8 mL/min (by C-G formula based on SCr of 1.57 mg/dL (H)).    No Known Allergies  Antimicrobials this admission: Vanc 9/16 >> 9/18, 9/20 >> 9/22 Zosyn 9/16 >> 9/18 CTX 9/18 >> 9/24 Vanc 9/25 >>  Microbiology results: 9/15 BCx: ngf 9/15 Sputum: mod group b strep/MSSA 9/15 MRSA PCR: negative 9/24 BCx2:  Thank you for allowing pharmacy to be a part of this patient's care.  Wynona Neat, PharmD, BCPS  08/28/2016 7:13 AM

## 2016-08-29 ENCOUNTER — Inpatient Hospital Stay (HOSPITAL_COMMUNITY): Payer: Medicaid Other

## 2016-08-29 DIAGNOSIS — I5021 Acute systolic (congestive) heart failure: Secondary | ICD-10-CM

## 2016-08-29 LAB — GLUCOSE, CAPILLARY
GLUCOSE-CAPILLARY: 159 mg/dL — AB (ref 65–99)
GLUCOSE-CAPILLARY: 174 mg/dL — AB (ref 65–99)
GLUCOSE-CAPILLARY: 183 mg/dL — AB (ref 65–99)
GLUCOSE-CAPILLARY: 184 mg/dL — AB (ref 65–99)
Glucose-Capillary: 159 mg/dL — ABNORMAL HIGH (ref 65–99)
Glucose-Capillary: 170 mg/dL — ABNORMAL HIGH (ref 65–99)
Glucose-Capillary: 187 mg/dL — ABNORMAL HIGH (ref 65–99)

## 2016-08-29 LAB — RENAL FUNCTION PANEL
ALBUMIN: 1.8 g/dL — AB (ref 3.5–5.0)
ALBUMIN: 1.6 g/dL — AB (ref 3.5–5.0)
ANION GAP: 6 (ref 5–15)
ANION GAP: 5 (ref 5–15)
Albumin: 2 g/dL — ABNORMAL LOW (ref 3.5–5.0)
Anion gap: 8 (ref 5–15)
BUN: 22 mg/dL — AB (ref 6–20)
BUN: 17 mg/dL (ref 6–20)
BUN: 20 mg/dL (ref 6–20)
CALCIUM: 7.6 mg/dL — AB (ref 8.9–10.3)
CALCIUM: 6.1 mg/dL — AB (ref 8.9–10.3)
CHLORIDE: 104 mmol/L (ref 101–111)
CO2: 24 mmol/L (ref 22–32)
CO2: 20 mmol/L — ABNORMAL LOW (ref 22–32)
CO2: 25 mmol/L (ref 22–32)
CREATININE: 1.11 mg/dL — AB (ref 0.44–1.00)
Calcium: 7.6 mg/dL — ABNORMAL LOW (ref 8.9–10.3)
Chloride: 102 mmol/L (ref 101–111)
Chloride: 111 mmol/L (ref 101–111)
Creatinine, Ser: 1.48 mg/dL — ABNORMAL HIGH (ref 0.44–1.00)
Creatinine, Ser: 1.4 mg/dL — ABNORMAL HIGH (ref 0.44–1.00)
GFR calc Af Amer: 44 mL/min — ABNORMAL LOW (ref >60)
GFR calc Af Amer: >60 mL/min (ref >60)
GFR calc Af Amer: 47 mL/min — ABNORMAL LOW (ref >60)
GFR calc non Af Amer: 53 mL/min — ABNORMAL LOW (ref >60)
GFR calc non Af Amer: 40 mL/min — ABNORMAL LOW (ref >60)
GFR, EST NON AFRICAN AMERICAN: 38 mL/min — AB (ref >60)
GLUCOSE: 189 mg/dL — AB (ref 65–99)
GLUCOSE: 157 mg/dL — AB (ref 65–99)
GLUCOSE: 192 mg/dL — AB (ref 65–99)
PHOSPHORUS: 2.2 mg/dL — AB (ref 2.5–4.6)
PHOSPHORUS: 2.3 mg/dL — AB (ref 2.5–4.6)
POTASSIUM: 5 mmol/L (ref 3.5–5.1)
POTASSIUM: 4.2 mmol/L (ref 3.5–5.1)
Phosphorus: 2.8 mg/dL (ref 2.5–4.6)
Potassium: 3.3 mmol/L — ABNORMAL LOW (ref 3.5–5.1)
SODIUM: 134 mmol/L — AB (ref 135–145)
SODIUM: 137 mmol/L (ref 135–145)
Sodium: 134 mmol/L — ABNORMAL LOW (ref 135–145)

## 2016-08-29 LAB — TYPE AND SCREEN
ABO/RH(D): AB POS
Antibody Screen: NEGATIVE
Unit division: 0

## 2016-08-29 LAB — POCT I-STAT 3, ART BLOOD GAS (G3+)
Acid-Base Excess: 2 mmol/L (ref 0.0–2.0)
BICARBONATE: 25.8 mmol/L (ref 20.0–28.0)
O2 Saturation: 99 %
PCO2 ART: 34 mmHg (ref 32.0–48.0)
PO2 ART: 105 mmHg (ref 83.0–108.0)
TCO2: 27 mmol/L (ref 0–100)
pH, Arterial: 7.487 — ABNORMAL HIGH (ref 7.350–7.450)

## 2016-08-29 LAB — COMPREHENSIVE METABOLIC PANEL
ALT: 291 U/L — ABNORMAL HIGH (ref 14–54)
AST: 139 U/L — ABNORMAL HIGH (ref 15–41)
Albumin: 1.8 g/dL — ABNORMAL LOW (ref 3.5–5.0)
Alkaline Phosphatase: 184 U/L — ABNORMAL HIGH (ref 38–126)
Anion gap: 9 (ref 5–15)
BUN: 21 mg/dL — ABNORMAL HIGH (ref 6–20)
CO2: 23 mmol/L (ref 22–32)
Calcium: 7.7 mg/dL — ABNORMAL LOW (ref 8.9–10.3)
Chloride: 101 mmol/L (ref 101–111)
Creatinine, Ser: 1.43 mg/dL — ABNORMAL HIGH (ref 0.44–1.00)
GFR calc Af Amer: 45 mL/min — ABNORMAL LOW (ref >60)
GFR calc non Af Amer: 39 mL/min — ABNORMAL LOW (ref >60)
Glucose, Bld: 191 mg/dL — ABNORMAL HIGH (ref 65–99)
Potassium: 4.7 mmol/L (ref 3.5–5.1)
Sodium: 133 mmol/L — ABNORMAL LOW (ref 135–145)
Total Bilirubin: 0.9 mg/dL (ref 0.3–1.2)
Total Protein: 5.9 g/dL — ABNORMAL LOW (ref 6.5–8.1)

## 2016-08-29 LAB — CBC
HCT: 28.1 % — ABNORMAL LOW (ref 36.0–46.0)
HCT: 26.8 % — ABNORMAL LOW (ref 36.0–46.0)
Hemoglobin: 9.5 g/dL — ABNORMAL LOW (ref 12.0–15.0)
Hemoglobin: 9 g/dL — ABNORMAL LOW (ref 12.0–15.0)
MCH: 29.4 pg (ref 26.0–34.0)
MCH: 29.3 pg (ref 26.0–34.0)
MCHC: 33.8 g/dL (ref 30.0–36.0)
MCHC: 33.6 g/dL (ref 30.0–36.0)
MCV: 87 fL (ref 78.0–100.0)
MCV: 87.3 fL (ref 78.0–100.0)
Platelets: 150 10*3/uL (ref 150–400)
Platelets: 143 10*3/uL — ABNORMAL LOW (ref 150–400)
RBC: 3.23 MIL/uL — ABNORMAL LOW (ref 3.87–5.11)
RBC: 3.07 MIL/uL — ABNORMAL LOW (ref 3.87–5.11)
RDW: 15.2 % (ref 11.5–15.5)
RDW: 15.4 % (ref 11.5–15.5)
WBC: 30.1 10*3/uL — ABNORMAL HIGH (ref 4.0–10.5)
WBC: 29.2 10*3/uL — ABNORMAL HIGH (ref 4.0–10.5)

## 2016-08-29 LAB — BLOOD CULTURE ID PANEL (REFLEXED)
ACINETOBACTER BAUMANNII: NOT DETECTED
CANDIDA ALBICANS: NOT DETECTED
CANDIDA KRUSEI: NOT DETECTED
CANDIDA PARAPSILOSIS: NOT DETECTED
Candida glabrata: NOT DETECTED
Candida tropicalis: NOT DETECTED
ENTEROBACTERIACEAE SPECIES: NOT DETECTED
ENTEROCOCCUS SPECIES: NOT DETECTED
ESCHERICHIA COLI: NOT DETECTED
Enterobacter cloacae complex: NOT DETECTED
Haemophilus influenzae: NOT DETECTED
KLEBSIELLA OXYTOCA: NOT DETECTED
Klebsiella pneumoniae: NOT DETECTED
LISTERIA MONOCYTOGENES: NOT DETECTED
Methicillin resistance: DETECTED — AB
Neisseria meningitidis: NOT DETECTED
PSEUDOMONAS AERUGINOSA: NOT DETECTED
Proteus species: NOT DETECTED
SERRATIA MARCESCENS: NOT DETECTED
STAPHYLOCOCCUS AUREUS BCID: NOT DETECTED
STREPTOCOCCUS PNEUMONIAE: NOT DETECTED
STREPTOCOCCUS PYOGENES: NOT DETECTED
Staphylococcus species: DETECTED — AB
Streptococcus agalactiae: NOT DETECTED
Streptococcus species: NOT DETECTED

## 2016-08-29 LAB — PROCALCITONIN: PROCALCITONIN: 4.85 ng/mL

## 2016-08-29 LAB — POCT ACTIVATED CLOTTING TIME
ACTIVATED CLOTTING TIME: 197 s
ACTIVATED CLOTTING TIME: 175 s
ACTIVATED CLOTTING TIME: 180 s
Activated Clotting Time: 180 seconds
Activated Clotting Time: 202 seconds
Activated Clotting Time: 208 seconds
Activated Clotting Time: 208 seconds
Activated Clotting Time: 213 seconds
Activated Clotting Time: 213 seconds
Activated Clotting Time: 224 seconds
Activated Clotting Time: 224 seconds
Activated Clotting Time: 230 seconds
Activated Clotting Time: 235 seconds

## 2016-08-29 LAB — APTT: APTT: 194 s — AB (ref 24–36)

## 2016-08-29 LAB — CARBOXYHEMOGLOBIN
Carboxyhemoglobin: 1.4 % (ref 0.5–1.5)
Methemoglobin: 1.3 % (ref 0.0–1.5)
O2 Saturation: 46.8 %
Total hemoglobin: 10 g/dL — ABNORMAL LOW (ref 12.0–16.0)

## 2016-08-29 LAB — MAGNESIUM: MAGNESIUM: 2.3 mg/dL (ref 1.7–2.4)

## 2016-08-29 MED ORDER — LIDOCAINE HCL (CARDIAC) 10 MG/ML IV SOLN
50.0000 mg | Freq: Once | INTRAVENOUS | Status: DC
  Filled 2016-08-29: qty 5

## 2016-08-29 MED ORDER — SODIUM PHOSPHATES 45 MMOLE/15ML IV SOLN
30.0000 mmol | Freq: Once | INTRAVENOUS | Status: AC
  Administered 2016-08-29: 30 mmol via INTRAVENOUS
  Filled 2016-08-29: qty 10

## 2016-08-29 MED ORDER — CHLORHEXIDINE GLUCONATE CLOTH 2 % EX PADS
6.0000 | MEDICATED_PAD | Freq: Every day | CUTANEOUS | Status: AC
  Administered 2016-08-30 – 2016-09-02 (×4): 6 via TOPICAL

## 2016-08-29 MED ORDER — MAGNESIUM HYDROXIDE 400 MG/5ML PO SUSP
15.0000 mL | Freq: Every day | ORAL | Status: DC | PRN
  Administered 2016-08-30: 15 mL via ORAL
  Filled 2016-08-29: qty 30

## 2016-08-29 MED ORDER — ALBUMIN HUMAN 25 % IV SOLN
25.0000 g | Freq: Three times a day (TID) | INTRAVENOUS | Status: AC
  Administered 2016-08-29 – 2016-08-30 (×3): 25 g via INTRAVENOUS
  Filled 2016-08-29: qty 100
  Filled 2016-08-29 (×3): qty 50

## 2016-08-29 MED ORDER — MUPIROCIN 2 % EX OINT
1.0000 "application " | TOPICAL_OINTMENT | Freq: Two times a day (BID) | CUTANEOUS | Status: AC
  Administered 2016-08-29 – 2016-09-02 (×10): 1 via NASAL
  Filled 2016-08-29 (×2): qty 22

## 2016-08-29 MED ORDER — LIDOCAINE HCL (CARDIAC) 20 MG/ML IV SOLN
50.0000 mg | Freq: Once | INTRAVENOUS | Status: AC
  Administered 2016-08-29: 50 mg via INTRAVENOUS
  Filled 2016-08-29: qty 5

## 2016-08-29 MED ORDER — NEPRO/CARBSTEADY PO LIQD
1000.0000 mL | ORAL | Status: DC
  Administered 2016-08-29 – 2016-08-30 (×2): 1000 mL
  Filled 2016-08-29 (×3): qty 1000

## 2016-08-29 NOTE — Progress Notes (Signed)
Patient ID: Deborah [name], female   DOB: 08/04/1956, 61 y.o.   MRN: [id number] EVENING ROUNDS NOTE :     [address]       [place] [address]             [phone]                 6 Days Post-Op Procedure(s) (LRB): MITRAL VALVE REPAIR (MVR) USING 25MM EDWARDS MAGNA EASE BIOPROSTHESIS MITRAL  VALVE (N/A) INTRAOPERATIVE TRANSESOPHAGEAL ECHOCARDIOGRAM (N/A) ENDOVEIN HARVEST OF GREATER SAPHENOUS VEIN (Left) CENTRAL LINE INSERTION RIGHT SUBCLAVIAN (Right)  Total Length of Stay:  LOS: 11 days  BP 117/75   Pulse (!) 137   Temp (!) 96.5 F (35.8 C) (Axillary)   Resp (!) 21   Ht 5\' 1"  (1.549 m) Comment: from 08/15/16 encounter  Wt 164 lb 14.5 oz (74.8 kg)   SpO2 100%   BMI 31.16 kg/m   .Intake/Output      09/25 0701 - 09/26 0700 09/26 0701 - 09/27 0700   I.V. (mL/kg) 1779.5 (23.8) 410.9 (5.5)   Blood 335    NG/GT 650 450   IV Piggyback 493 1248   Total Intake(mL/kg) 3257.5 (43.5) 2108.9 (28.2)   Urine (mL/kg/hr) 0 (0)    Emesis/NG output 100 (0.1) 0 (0)   Other 3848 (2.1) 2253 (2.6)   Chest Tube 0 (0) 10 (0)   Total Output 3948 2263   Net -690.5 -154.1        Urine Occurrence 2 x      . sodium chloride Stopped (08/27/16 0900)  . sodium chloride    . sodium chloride 20 mL/hr at 08/29/16 1500  . dexmedetomidine 0.5 mcg/kg/hr (08/29/16 1828)  . epinephrine 1 mcg/min (08/29/16 0800)  . heparin 10,000 units/ 20 mL infusion syringe 1,400 Units/hr (08/29/16 1800)  . lactated ringers    . lactated ringers    . lidocaine Stopped (08/29/16 0900)  . milrinone 0.3 mcg/kg/min (08/29/16 1423)  . norepinephrine (LEVOPHED) Adult infusion 2 mcg/min (08/29/16 1705)  . dialysis replacement fluid (prismasate) 600 mL/hr at 08/29/16 1357  . dialysis replacement fluid (prismasate) 200 mL/hr at 08/29/16 0200  . dialysate (PRISMASATE) 1,000 mL/hr at 08/29/16 1635     Lab Results  Component Value Date   WBC 30.1 (H) 08/29/2016   HGB 9.5 (L) 08/29/2016   HCT 28.1 (L)  08/29/2016   PLT 150 08/29/2016   GLUCOSE 192 (H) 08/29/2016   CHOL 207 (H) 07/09/2014   TRIG 174.0 (H) 07/09/2014   HDL 40.60 07/09/2014   LDLCALC 132 (H) 07/09/2014   ALT 291 (H) 08/29/2016   AST 139 (H) 08/29/2016   NA 134 (L) 08/29/2016   K 4.2 08/29/2016   CL 104 08/29/2016   CREATININE 1.40 (H) 08/29/2016   BUN 20 08/29/2016   CO2 25 08/29/2016   TSH 1.485 Test methodology is 3rd generation TSH 01/26/2010   INR 1.50 08/23/2016   HGBA1C 13.3 (H) 08/15/2016   MICROALBUR 15.8 (H) 07/09/2014   Opens eyes to name, moves left foot not right Remains sedated on ven Hr [place] MD  Beeper [phone] Office [phone] 08/29/2016 6:47 PM

## 2016-08-29 NOTE — Progress Notes (Signed)
  PHARMACY - PHYSICIAN COMMUNICATION CRITICAL VALUE ALERT - BLOOD CULTURE IDENTIFICATION (BCID)  Results for orders placed or performed during the hospital encounter of 08/18/16  Blood Culture ID Panel (Reflexed) (Collected: 08/27/2016  9:00 AM)  Result Value Ref Range   Enterococcus species NOT DETECTED NOT DETECTED   Listeria monocytogenes NOT DETECTED NOT DETECTED   Staphylococcus species DETECTED (A) NOT DETECTED   Staphylococcus aureus NOT DETECTED NOT DETECTED   Methicillin resistance DETECTED (A) NOT DETECTED   Streptococcus species NOT DETECTED NOT DETECTED   Streptococcus agalactiae NOT DETECTED NOT DETECTED   Streptococcus pneumoniae NOT DETECTED NOT DETECTED   Streptococcus pyogenes NOT DETECTED NOT DETECTED   Acinetobacter baumannii NOT DETECTED NOT DETECTED   Enterobacteriaceae species NOT DETECTED NOT DETECTED   Enterobacter cloacae complex NOT DETECTED NOT DETECTED   Escherichia coli NOT DETECTED NOT DETECTED   Klebsiella oxytoca NOT DETECTED NOT DETECTED   Klebsiella pneumoniae NOT DETECTED NOT DETECTED   Proteus species NOT DETECTED NOT DETECTED   Serratia marcescens NOT DETECTED NOT DETECTED   Haemophilus influenzae NOT DETECTED NOT DETECTED   Neisseria meningitidis NOT DETECTED NOT DETECTED   Pseudomonas aeruginosa NOT DETECTED NOT DETECTED   Candida albicans NOT DETECTED NOT DETECTED   Candida glabrata NOT DETECTED NOT DETECTED   Candida krusei NOT DETECTED NOT DETECTED   Candida parapsilosis NOT DETECTED NOT DETECTED   Candida tropicalis NOT DETECTED NOT DETECTED    Name of physician (or Provider) Contacted: none   Changes to prescribed antibiotics required: Currently on appropriate therapy vancomycin and zosyn  - dosing ok will check VT prior to dose 4  Nadine Counts 08/29/2016  4:36 PM

## 2016-08-29 NOTE — Progress Notes (Signed)
Subjective: Interval History: sedated on vent, trying to lower pressors.  Objective: Vital signs in last 24 hours: Temp:  [96.9 F (36.1 C)-99.1 F (37.3 C)] 99.1 F (37.3 C) (09/26 0400) Pulse Rate:  [92-133] 126 (09/26 0700) Resp:  [9-37] 31 (09/26 0700) BP: (82-183)/(54-97) 105/65 (09/26 0700) SpO2:  [98 %-100 %] 100 % (09/26 0700) Arterial Line BP: (92-163)/(44-84) 115/62 (09/25 1145) FiO2 (%):  [40 %] 40 % (09/26 0400) Weight:  [74.8 kg (164 lb 14.5 oz)] 74.8 kg (164 lb 14.5 oz) (09/26 0500) Weight change:   Intake/Output from previous day: 09/25 0701 - 09/26 0700 In: 3207.5 [I.V.:1779.5; Blood:335; NG/GT:650; IV [id number]:638] Out: 7564 [Emesis/NG output:100] Intake/Output this shift: No intake/output data recorded.  General appearance: sedated, not responsive, entub Resp: rales bibasilar Cardio: S1, S2 normal and systolic murmur: holosystolic 2/6, blowing at apex GI: pos bs, soft, liver down 5 cm Extremities: edema 2-3+  Lab Results:  Recent Labs  08/28/16 0429 08/29/16 0431  WBC 30.1* 30.1*  HGB 8.3* 9.5*  HCT 24.7* 28.1*  PLT 148* 150   BMET:  Recent Labs  08/29/16 0431 08/29/16 0432  NA 133* 134*  K 4.7 5.0  CL 101 102  CO2 23 24  GLUCOSE 191* 189*  BUN 21* 22*  CREATININE 1.43* 1.48*  CALCIUM 7.7* 7.6*   No results for input(s): PTH in the last 72 hours. Iron Studies: No results for input(s): IRON, TIBC, TRANSFERRIN, FERRITIN in the last 72 hours.  Studies/Results: Ct Head Wo Contrast  Result Date: 08/28/2016 CLINICAL DATA:  Status post intubation. EXAM: CT HEAD WITHOUT CONTRAST TECHNIQUE: Contiguous axial images were obtained from the base of the skull through the vertex without intravenous contrast. COMPARISON:  CT head dated 01/22/2010. FINDINGS: Brain: There is mild generalized parenchymal atrophy with commensurate dilatation of the ventricles and sulci. Small old lacunar infarct noted within the right basal ganglia region. There is no  mass, hemorrhage, edema or other evidence of acute parenchymal abnormality. No extra-axial hemorrhage. Vascular: There are chronic calcified atherosclerotic changes of the large vessels at the skull base. No unexpected hyperdense vessel. Skull: Normal. Negative for fracture or focal lesion. Sinuses/Orbits: No acute finding. Other: None IMPRESSION: No acute findings.  No intracranial mass, hemorrhage or edema. Electronically Signed   By: Franki Cabot M.D.   On: 08/28/2016 19:27   Dg Chest Port 1 View  Result Date: 08/27/2016 CLINICAL DATA:  60 year old female with a history of mitral valve repair EXAM: PORTABLE CHEST 1 VIEW COMPARISON:  08/27/2016, 08/26/2016, 08/25/2016 FINDINGS: Cardiomediastinal silhouette unchanged. Surgical changes of prior median sternotomy and mitral valve annuloplasty. Unchanged position of right-sided thoracostomy tube, gastric tube, right subclavian central line. Unchanged position of the endotracheal tube. Unchanged left-sided IJ central venous catheter. Unchanged position of mediastinal drain and epicardial pacing leads. Interval removal of right IJ sheath and Swan-Ganz catheter. Minimal interstitial airspace opacities at the lung bases. Blunting of left costophrenic angle with pleural parenchymal thickening. No pneumothorax. IMPRESSION: Interval removal of right IJ sheath and Swan-Ganz catheter. Mild airspace disease at the bases with likely trace left pleural effusion. Unchanged endotracheal tube, gastric tube, right subclavian central line, left IJ catheter, mediastinal/pleural drains, and epicardial pacing leads. Surgical changes of median sternotomy and mitral valve annuloplasty. Signed, Dulcy Fanny. Earleen Newport, DO [facility] Electronically Signed   By: Corrie Mckusick D.O.   On: 08/27/2016 17:12   Dg Chest Port 1v Same Day  Result Date: 08/27/2016 CLINICAL DATA:  Post MITRAL  VALVE REPAIR (MVR), INTRAOPERATIVE TRANSESOPHAGEAL  ECHOCARDIOGRAM, ENDOVEIN HARVEST OF GREATER SAPHENOUS VEIN, CENTRAL LINE INSERTION on 08/23/2016. H/O diabetes, hypertension. Pt is intubated and on 24/7 dialysis. Non smoker EXAM: PORTABLE CHEST 1 VIEW COMPARISON:  08/26/2016 FINDINGS: right IJ Swan-Ganz catheter unchanged. There is also a left-sided internal jugular dialysis catheter which terminates at the mid SVC. Endotracheal tube terminates 3.3 cm above carina.Nasogastric and feeding tubes extend beyond the inferior aspect of the film. Mediastinal drain and right-sided chest tube. Cardiomegaly accentuated by AP portable technique. No pleural effusion or pneumothorax. Persistent mild to moderate interstitial edema. Bibasilar airspace disease. Right-sided subclavian line is also unchanged. IMPRESSION: No significant change since the prior exam. Cardiomegaly with mild to moderate interstitial edema and bibasilar airspace disease. Stable support apparatus, without pneumothorax. Electronically Signed   By: [name] Miyamoto M.D.   On: 08/27/2016 09:06    I have reviewed the patient's current medications.  Assessment/Plan: 1 AKI oliguric ATN, CVP stable, CXR and  Peripheral vol xs.  Will lower goals with low bps.  Suspect infx a role. Acid/base/K stable, solute ok. 2 CAD stents 3 MV repair 4 ^WBC on 3 AB, needs line change 5 DM controlled 6 Nutrition TF 7 Anemia Transfused P CRRT, AB, change line, pressors.    LOS: 11 days   Amiir Heckard L 08/29/2016,7:08 AM

## 2016-08-29 NOTE — Progress Notes (Signed)
PULMONARY / CRITICAL CARE MEDICINE   Name: Deborah [name] MRN: [id number] DOB: [birth year]    ADMISSION DATE:  08/18/2016 CONSULTATION DATE:  08/18/2016  REFERRING MD:  Bensimhon   CHIEF COMPLAINT:  Ventilator management and critical care services   HISTORY OF PRESENT ILLNESS:   60 yo female admitted to [facility] 08/15/16 with STEMI s/p cath to [place].  Developed cardiogenic shock, VDRF (ETT 9/14), AKI with metabolic acidosis and started on CRRT 08/17/16.  Transferred to [facility] to tx shock and evaluated new severe MR from papillary muscle rupture.  She also had fever and found to have Group B Strep in sputum culture. Mitral valve replacement 9/20.  SUBJECTIVE:  Note of R sided preferential gaze and R sided hemiplegia last 24 hrs. CT cranial done and was (-) for acute CVA Remains on vent. Tachpneic on full support. Was breathing in the 40s this am per RN.  WBC remains elevated, afebrile On epi and levophed drips. Less rate this am Tolerating CVVH  VITAL SIGNS: BP 100/65   Pulse (!) 105   Temp 98.6 F (37 C) (Axillary)   Resp 13   Ht 5\' 1"  (1.549 m) Comment: from 08/15/16 encounter  Wt 74.8 kg (164 lb 14.5 oz)   SpO2 98%   BMI 31.16 kg/m   HEMODYNAMICS: CVP:  [8 mmHg-21 mmHg] 21 mmHg  VENTILATOR SETTINGS: Vent Mode: PRVC FiO2 (%):  [40 %] 40 % Set Rate:  [16 bmp] 16 bmp Vt Set:  [400 mL] 400 mL PEEP:  [5 cmH20] 5 cmH20 Plateau Pressure:  [9 cmH20-15 cmH20] 15 cmH20  INTAKE / OUTPUT: I/O last 3 completed shifts: In: 4681.6 [I.V.:2973.6; Blood:335; NG/GT:930; IV [id number]:299] Out: 6092 [Emesis/NG output:300; BZJIR:6789; Chest Tube:20]  PHYSICAL EXAMINATION: General: awake, NAD. Comfortable.  HENT: NCAT ETT in place. R IJ TLC and L IJ HD cath PULM: fair ae. Dec BS bases with crackles at bases CV: soft F8/B0. (+) systolic murmur at apex, mediastinal drains in place GI: minimal bowel sounds. Soft. (-) masses/tenderness Derm: Gr 2 edema arms/legs Neuro: Preferental gaze to R. (-)  facial asymmetry noted. (+) visual threat.  R sided hemiplegia (some RUE withdrawal to deep sternal rub; (-) mm noted in RLE)  LABS:  BMET  Recent Labs Lab 08/28/16 1545 08/29/16 0431 08/29/16 0432  NA 132* 133* 134*  K 4.5 4.7 5.0  CL 100* 101 102  CO2 24 23 24   BUN 21* 21* 22*  CREATININE 1.47* 1.43* 1.48*  GLUCOSE 238* 191* 189*    Electrolytes  Recent Labs Lab 08/27/16 0321  08/28/16 0429 08/28/16 1545 08/29/16 0431 08/29/16 0432  CALCIUM 7.6*  < > 7.4* 7.7* 7.7* 7.6*  MG 2.3  --  2.3  --  2.3  --   PHOS 2.9  < > 2.4* 3.8  --  2.2*  < > = values in this interval not displayed.  CBC  Recent Labs Lab 08/27/16 0321 08/27/16 1635 08/28/16 0429 08/29/16 0431  WBC 27.8*  --  30.1* 30.1*  HGB 8.8* 9.5* 8.3* 9.5*  HCT 25.8* 28.0* 24.7* 28.1*  PLT 148*  --  148* 150    Coag's  Recent Labs Lab 08/23/16 1553 08/27/16 0321 08/28/16 0429 08/29/16 0431  APTT 40* 123* 156* 194*  INR 1.50  --   --   --     Sepsis Markers  Recent Labs Lab 08/28/16 0909 08/29/16 0431  PROCALCITON 6.74 4.85    ABG  Recent Labs Lab 08/27/16 1636 08/28/16 1817 08/29/16 0408  PHART 7.430 7.425 7.487*  PCO2ART 36.8 38.0 34.0  PO2ART 109.0* 88.0 105.0    Liver Enzymes  Recent Labs Lab 08/27/16 0321  08/28/16 0429 08/28/16 1545 08/29/16 0431 08/29/16 0432  AST 694*  --  299*  --  139*  --   ALT 532*  --  387*  --  291*  --   ALKPHOS 193*  --  191*  --  184*  --   BILITOT 0.8  --  0.9  --  0.9  --   ALBUMIN 1.9*  < > 1.9* 1.8* 1.8* 1.8*  < > = values in this interval not displayed.  Cardiac Enzymes No results for input(s): TROPONINI, PROBNP in the last 168 hours.  Glucose  Recent Labs Lab 08/28/16 1154 08/28/16 1547 08/28/16 1943 08/29/16 0010 08/29/16 0413 08/29/16 0737  GLUCAP 210* 218* 172* 159* 170* 174*    Imaging Ct Head Wo Contrast  Result Date: 08/28/2016 CLINICAL DATA:  Status post intubation. EXAM: CT HEAD WITHOUT CONTRAST  TECHNIQUE: Contiguous axial images were obtained from the base of the skull through the vertex without intravenous contrast. COMPARISON:  CT head dated 01/22/2010. FINDINGS: Brain: There is mild generalized parenchymal atrophy with commensurate dilatation of the ventricles and sulci. Small old lacunar infarct noted within the right basal ganglia region. There is no mass, hemorrhage, edema or other evidence of acute parenchymal abnormality. No extra-axial hemorrhage. Vascular: There are chronic calcified atherosclerotic changes of the large vessels at the skull base. No unexpected hyperdense vessel. Skull: Normal. Negative for fracture or focal lesion. Sinuses/Orbits: No acute finding. Other: None IMPRESSION: No acute findings.  No intracranial mass, hemorrhage or edema. Electronically Signed   By: Franki Cabot M.D.   On: 08/28/2016 19:27   Dg Chest Port 1 View  Result Date: 08/29/2016 CLINICAL DATA:  Hypoxia EXAM: PORTABLE CHEST 1 VIEW COMPARISON:  August 27, 2016 FINDINGS: Endotracheal tube tip is 3.7 cm above the carina. Central catheter tip is in the superior vena cava. Nasogastric and feeding tube tips are below the diaphragm. There is a mediastinal drain and right chest tube. No pneumothorax. There are bilateral pleural effusions with patchy airspace consolidation in the left base and somewhat generalized interstitial edema. Edema and effusions have increased compared to recent study. Consolidation in the left base is stable. Cardiomegaly is stable. The pulmonary vascular is normal. Patient is status post mitral valve replacement. IMPRESSION: Tube and catheter positions as described without pneumothorax. Persistent consolidation left base. New pleural effusions with interstitial edema. Suspect a degree of congestive heart failure. Electronically Signed   By: Lowella Grip III M.D.   On: 08/29/2016 08:28    STUDIES:  LHC 9/12: PCI to OM1, severe MR, EF 55 to 60% RHC 9/15: RA 5, RV 37/3/6, PA  38/18/26, PCWP 15, CI 1.9, PVR 3.3 WU TEE 9/15: EF 60 to 65%, flail motion MV with severe MR Port CXR 9/21:  Tubes in lines in good position. Hazy bilateral opacities. Port CXR 9/23: Lines, tubes, and endotracheal tube in good position. Persistent pulmonary vascular congestion and effusions RUQ ultrasound 9/23 > Gallbladder wall thickening noted, suggestion of sludge, small R effusion, liver unremarkable but left lobe not well visulalized  MICROBIOLOGY: MRSA PCR 9/12:  Negative MRSA PCR 9/15:  Negative Tracheal Asp Ctx 9/15:  Strep agalactiae  Blood Ctx x2 9/15:  Negative  MRSA PCR 9/19:  Negative   ANTIBIOTICS: Zosyn 9/16 - 9/17 Vancomycin 9/16 - 9/17 Rocephin 9/17 >> 9/24 Vancomycin 9/20 (surgical prophylaxis)  Vanc 9/25 > Zosyn 9/25 >  Fluconazole 9/25 >   SIGNIFICANT EVENTS: 9/12 Admit to [facility], PCI to [place] 9/14 ETT, CRRT 9/15 to [facility] 9/16 off insulin gtt 9/20 To OR for Mitral Valve Repair w/ bioprosthetic valve  LINES/TUBES: Right IJ PICC 9/12 - 9/20 Rt femoral introducer 9/15 - 9/20 OETT 8.0 9/14 >>  HD cath left IJ 9/14 >> R IJ Introducer/CVL/Swan 9/20 >>  9/25 IABP 9/15 >>9/22 Lt femoral a line 9/15 >> 9/25 Y Chest Tube/Mediastinal Chest Tube 9/20 >> PIV x1 L brachial art line 9/25 >  ASSESSMENT / PLAN:  PULMONARY A: Acute Hypoxic Respiratory Failure - Secondary to acute pulmonary edema.  There is concern now regarding change in mental status which may prevent her from protecting her airway.  P:   Cont vent support. Daily PST as tolerated.  Cont to assess neuro status/findings.  If no significant improvement in 24-48hrs, anticipate pt will need a trache/PEG.  She still has volume issues and her decreased mental status make it difficult to wean/extubate safely. Pt has been on the vent since 9/14 (D12).  VAP prevention bundle Chest tube Mgt per CVTS   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 Shock -  Combination cardiogenic & septic  P:  Continuous Telemetry Monitoring Vitals per unit protocol Post-op Mgt per CVTS/Cardiology ASA, & Lopressor ordered  Epinephrine, Levophed, Milrinone gtt's per cardiology/CVTS   RENAL A:   Acute Renal Failure - Baseline creatinine 1.6-1.7  P:   Nephrology Following Continue volume removal per CVVHD > plan to remove HD cath later this week and will get a perm cath then Monitor BMET and UOP Replace electrolytes as needed  GASTROINTESTINAL A:   Transaminitis - improving, amio or statin effect Mild Malnutrition - Related to critical illness  P:   Pepcid IV q12hr Cont TF Amiodarone and Statins on hold due to LFT abnormality Check LFTs later this week   HEMATOLOGIC A:   Anemia - Last transfusion 9/20 Leukocytosis - Multifactorial.  P:  Trending cell counts w/ CBC SCDs  INFECTIOUS A:   Group B Strep Pneumonia Leukocytosis P:   Still with WBC elevated. Low grade fever. Vanc and zosyn restarted on 9/25 and Fluconazole started on 9/25.  PCT elevated (but with CKD) > cont to trend.  Femoral art line was removed on 9/25 Plan to re-culture for fever  ENDOCRINE A:   DM - Glucose controlled  P:   Subcutaneous insulin per CVTS Levemir BID  NEUROLOGIC A:   New onset R sided hemiplegia and Preferental gaze to Right (9/25). Cranial CT scan was (-) for acute changes.  Acute Encephalopathy - Multifactorial and likely combination toxic metabolic & hypoxia  P:   Observe neuro status. Likely will need Cranial MRI once more stable and once able (on CVVH, has drips, etc) RASS goal: -1 Precedex gtt  Versed IV prn Sedation > lower dose Fentanyl IV prn   No family at bedside.  Critical care time with this patient today : 35 minutes.    Monica Becton, MD 08/29/2016, 8:59 AM [facility] Pager [phone] After 3 pm or if no answer, call [phone]

## 2016-08-29 NOTE — Progress Notes (Addendum)
CRITICAL VALUE ALERT  Critical value received: Calcium 6.1 Date of notification: 08/29/16 Time of notification: 4:30  Critical value read back:Yes.    Nurse who received alert:  Cleotis Nipper Dr. Jimmy Footman notified.  Plan to redraw labs.  New Calcium 7.6.

## 2016-08-29 NOTE — Progress Notes (Signed)
6 Days Post-Op Procedure(s) (LRB): MITRAL VALVE REPAIR (MVR) USING 25MM EDWARDS MAGNA EASE BIOPROSTHESIS MITRAL  VALVE (N/A) INTRAOPERATIVE TRANSESOPHAGEAL ECHOCARDIOGRAM (N/A) ENDOVEIN HARVEST OF GREATER SAPHENOUS VEIN (Left) CENTRAL LINE INSERTION RIGHT SUBCLAVIAN (Right) Subjective: Little change in status, on cvvh and vent appears septic- needs Diatek cathand trach this week Metabolic encephalopathy - no CVA on head CT TF nepro to 40 cc Objective: Vital signs in last 24 hours: Temp:  [97 F (36.1 C)-99.1 F (37.3 C)] 97.8 F (36.6 C) (09/26 1139) Pulse Rate:  [98-134] 133 (09/26 1200) Cardiac Rhythm: Normal sinus rhythm;Heart block (09/26 0400) Resp:  [9-37] 20 (09/26 1200) BP: (82-183)/(54-97) 134/79 (09/26 1110) SpO2:  [98 %-100 %] 100 % (09/26 1200) FiO2 (%):  [40 %] 40 % (09/26 1110) Weight:  [164 lb 14.5 oz (74.8 kg)] 164 lb 14.5 oz (74.8 kg) (09/26 0500)  Hemodynamic parameters for last 24 hours: CVP:  [8 mmHg-21 mmHg] 21 mmHg  Intake/Output from previous day: 09/25 0701 - 09/26 0700 In: 3207.5 [I.V.:1779.5; Blood:335; NG/GT:650; IV [id number]:768] Out: 0881 [Emesis/NG output:100] Intake/Output this shift: Total I/O In: 991 [I.V.:176; NG/GT:150; IV Piggyback:665] Out: 948 [Other:938; Chest Tube:10]       Exam    General- not alert, a comfortable on vent   Lungs- clear without rales, wheezes   Cor- regular rate and rhythm, no murmur , gallop   Abdomen- soft, non-tender   Extremities - warm, non-tender, minimal edema   Neuro- oriented, appropriate, no focal weakness   Lab Results:  Recent Labs  08/28/16 0429 08/29/16 0431  WBC 30.1* 30.1*  HGB 8.3* 9.5*  HCT 24.7* 28.1*  PLT 148* 150   BMET:  Recent Labs  08/29/16 0431 08/29/16 0432  NA 133* 134*  K 4.7 5.0  CL 101 102  CO2 23 24  GLUCOSE 191* 189*  BUN 21* 22*  CREATININE 1.43* 1.48*  CALCIUM 7.7* 7.6*    PT/INR: No results for input(s): LABPROT, INR in the last 72 hours. ABG     Component Value Date/Time   PHART 7.487 (H) 08/29/2016 0408   HCO3 25.8 08/29/2016 0408   TCO2 27 08/29/2016 0408   ACIDBASEDEF 3.0 (H) 08/26/2016 1615   O2SAT 46.8 08/29/2016 0410   CBG (last 3)   Recent Labs  08/29/16 0413 08/29/16 0737 08/29/16 1138  GLUCAP 170* 174* 187*    Assessment/Plan: S/P Procedure(s) (LRB): MITRAL VALVE REPAIR (MVR) USING 25MM EDWARDS MAGNA EASE BIOPROSTHESIS MITRAL  VALVE (N/A) INTRAOPERATIVE TRANSESOPHAGEAL ECHOCARDIOGRAM (N/A) ENDOVEIN HARVEST OF GREATER SAPHENOUS VEIN (Left) CENTRAL LINE INSERTION RIGHT SUBCLAVIAN (Right) Persistent wbc > 30k New aline placed yesterday, central line placed 6 days ago, HD cath placed   at [facility] 2 weeks ago Needs trach, percutaneous by CCM  Needs Diatek catheter- [facility] called  LOS: 11 days    Tharon Aquas Trigt III 08/29/2016

## 2016-08-29 NOTE — Progress Notes (Addendum)
Patient ID: Deborah [name], female   DOB: 1956/10/14, 60 y.o.   MRN: [id number]   60 y/o woman with DM2, HTN, HL and noncompliance presented to [facility] ER with infrerior posterior STEMI on 9/12. Taken to cath lab and found to have occluded small to moderate OM-1 branch. Underwent successful PCS with stent x 2 by Dr. Clayborn Bigness. Coronaries otherwise ok. EF 55-60% with severe MR confirmed by echo.   Post-cath developed respiratory failure and required intubation. Developed shock and renal failure with peak creatinine 3.4 (1.6 on admit). Trialysis catheter placed.   Patient persistently acidotic with concern for ischemic MR and transferred here. En route develop severe hypotension and norepinephrine started.   Brought to cath lab 9/15 and TEE showed normal LV (EF 60-65%) and normal RV function with ruptured posterior papillary muscle with severe posterior MR.  Underwent placement of Swan and IABP.   S/P MVR 08/23/16.  IABP pulled 9/22.   Echo 9/23  RV and LV are normal (EF 60%) but appeared underfilled   Remains intubated but agitated. Not moving right side apparently. Head CT ok.   Weaning pressors: epi 1. norepi 5.  milrinone 0.25. On lido 1 with no ectopy.  MAPs 70s.   Weight down close to baseline. CVP 14. On CVVHD at 25/hr now   Vanc/zosyn started 9/24. WBC stable at 30K. Lines to be changed today   Scheduled Meds: . aspirin EC  325 mg Oral Daily   Or  . aspirin  324 mg Per Tube Daily  . chlorhexidine gluconate (MEDLINE KIT)  15 mL Mouth Rinse BID  . clopidogrel  75 mg Oral Daily  . docusate sodium  200 mg Oral Daily  . famotidine (PEPCID) IV  20 mg Intravenous Q12H  . feeding supplement (NEPRO CARB STEADY)  1,000 mL Per Tube Q24H  . fluconazole (DIFLUCAN) IV  400 mg Intravenous Q24H  . insulin aspart  0-24 Units Subcutaneous Q4H  . insulin detemir  10 Units Subcutaneous BID  . mouth rinse  15 mL Mouth Rinse 10 times per day  . piperacillin-tazobactam  3.375 g Intravenous Q6H  .  sodium chloride flush  3 mL Intravenous Q12H  . sodium phosphate  Dextrose 5% IVPB  30 mmol Intravenous Once  . vancomycin  1,000 mg Intravenous Q24H   Continuous Infusions: . sodium chloride Stopped (08/27/16 0900)  . sodium chloride    . sodium chloride 20 mL/hr at 08/29/16 0700  . dexmedetomidine 0.7 mcg/kg/hr (08/29/16 0700)  . epinephrine 1.013 mcg/min (08/29/16 0700)  . heparin 10,000 units/ 20 mL infusion syringe 1,300 Units/hr (08/29/16 0326)  . lactated ringers    . lactated ringers    . lidocaine 1 mg/min (08/29/16 0700)  . milrinone 0.3 mcg/kg/min (08/29/16 0700)  . norepinephrine (LEVOPHED) Adult infusion 5 mcg/min (08/29/16 0700)  . dialysis replacement fluid (prismasate) 600 mL/hr at 08/29/16 0514  . dialysis replacement fluid (prismasate) 200 mL/hr at 08/29/16 0200  . dialysate (PRISMASATE) 1,000 mL/hr at 08/29/16 0609   PRN Meds:.sodium chloride, Place/Maintain arterial line **AND** sodium chloride, docusate, fentaNYL (SUBLIMAZE) injection, heparin, heparin, midazolam, ondansetron (ZOFRAN) IV, ondansetron (ZOFRAN) IV, sodium chloride, sodium chloride flush, sodium chloride flush    Vitals:   08/29/16 0730 08/29/16 0740 08/29/16 0745 08/29/16 0822  BP: 96/61  138/81 100/65  Pulse: (!) 127  (!) 131 (!) 105  Resp: 15  (!) 26 13  Temp:  98.6 F (37 C)    TempSrc:  Axillary    SpO2: 100%  100% 98%  Weight:      Height:        Intake/Output Summary (Last 24 hours) at 08/29/16 0829 Last data filed at 08/29/16 0700  Gross per 24 hour  Intake          2819.16 ml  Output             3776 ml  Net          -956.84 ml    LABS: Basic Metabolic Panel:  Recent Labs  08/28/16 0429 08/28/16 1545 08/29/16 0431 08/29/16 0432  NA 130* 132* 133* 134*  K 4.6 4.5 4.7 5.0  CL 99* 100* 101 102  CO2 24 24 23 24   GLUCOSE 216* 238* 191* 189*  BUN 18 21* 21* 22*  CREATININE 1.57* 1.47* 1.43* 1.48*  CALCIUM 7.4* 7.7* 7.7* 7.6*  MG 2.3  --  2.3  --   PHOS 2.4* 3.8  --   2.2*   Liver Function Tests:  Recent Labs  08/28/16 0429  08/29/16 0431 08/29/16 0432  AST 299*  --  139*  --   ALT 387*  --  291*  --   ALKPHOS 191*  --  184*  --   BILITOT 0.9  --  0.9  --   PROT 5.8*  --  5.9*  --   ALBUMIN 1.9*  < > 1.8* 1.8*  < > = values in this interval not displayed. No results for input(s): LIPASE, AMYLASE in the last 72 hours. CBC:  Recent Labs  08/28/16 0429 08/29/16 0431  WBC 30.1* 30.1*  HGB 8.3* 9.5*  HCT 24.7* 28.1*  MCV 85.8 87.0  PLT 148* 150   Cardiac Enzymes: No results for input(s): CKTOTAL, CKMB, CKMBINDEX, TROPONINI in the last 72 hours. BNP: Invalid input(s): POCBNP D-Dimer: No results for input(s): DDIMER in the last 72 hours. Hemoglobin A1C: No results for input(s): HGBA1C in the last 72 hours. Fasting Lipid Panel: No results for input(s): CHOL, HDL, LDLCALC, TRIG, CHOLHDL, LDLDIRECT in the last 72 hours. Thyroid Function Tests: No results for input(s): TSH, T4TOTAL, T3FREE, THYROIDAB in the last 72 hours.  Invalid input(s): FREET3 Anemia Panel: No results for input(s): VITAMINB12, FOLATE, FERRITIN, TIBC, IRON, RETICCTPCT in the last 72 hours.  RADIOLOGY: Dg Chest 1 View  Result Date: 08/17/2016 CLINICAL DATA:  Endotracheal tube placement, OG tube placement EXAM: CHEST 1 VIEW COMPARISON:  Portable chest x-ray of 08/17/2016 FINDINGS: The tip of the endotracheal tube is within the right mainstem bronchus and needs to be withdrawn by approximately 5 cm per OG tube extends below the hemidiaphragm. Bilateral primarily perihilar airspace disease again is noted most consistent with edema or possibly aspiration. A small left pleural effusion is noted. Right central venous line is unchanged. IMPRESSION: 1. Endotracheal tip is within the right mainstem bronchus and needs to be withdrawn by approximately 5 cm. 2. OG tube extends below the hemidiaphragm. 3. Little change in bilateral airspace disease with small left pleural effusion.  Electronically Signed   By: Ivar Drape M.D.   On: 08/17/2016 08:05   Dg Chest 1 View  Result Date: 08/17/2016 CLINICAL DATA:  Acute respiratory failure. EXAM: CHEST 1 VIEW COMPARISON:  08/15/2016 FINDINGS: Type of the right central line in the mid SVC. The bilateral perihilar opacities are becoming more confluent, air bronchograms non noted in the right suprahilar region. Stable cardiomegaly. Increasing left lung base atelectasis. Small pleural effusion on prior exam is not well visualized, suspect unchanged. No pneumothorax. IMPRESSION:  Coalescing perihilar bilateral opacities, suspicious for increasing pulmonary edema. Increasing left lung base atelectasis. Electronically Signed   By: Jeb Levering M.D.   On: 08/17/2016 03:15   Dg Chest 1 View  Result Date: 08/15/2016 CLINICAL DATA:  Respiratory failure EXAM: CHEST 1 VIEW COMPARISON:  January 22, 2010 FINDINGS: The mediastinal contour is normal. The heart size is enlarged. The diffuse airspace opacities identified throughout bilateral lungs. There is probably a minimal left pleural effusion. The osseous structures are normal. IMPRESSION: Congestive heart failure. Electronically Signed   By: Abelardo Diesel M.D.   On: 08/15/2016 19:25   Dg Abd 1 View  Result Date: 08/17/2016 CLINICAL DATA:  Endotracheal tube placement, OG tube placement EXAM: ABDOMEN - 1 VIEW COMPARISON:  CT abdomen pelvis of 12/10/2010 FINDINGS: The OG tube tip extends into the body the stomach. The bowel gas pattern is nonspecific. Contrast is noted being excreted by the kidneys and within the urinary bladder secondary to recent CT of the abdomen pelvis. The bowel gas pattern is nonspecific. There may be a small left pleural effusion present. IMPRESSION: OG tube tip extends into the body the stomach. The bowel gas pattern is nonspecific. Electronically Signed   By: Ivar Drape M.D.   On: 08/17/2016 08:09   Ct Head Wo Contrast  Result Date: 08/28/2016 CLINICAL DATA:  Status post  intubation. EXAM: CT HEAD WITHOUT CONTRAST TECHNIQUE: Contiguous axial images were obtained from the base of the skull through the vertex without intravenous contrast. COMPARISON:  CT head dated 01/22/2010. FINDINGS: Brain: There is mild generalized parenchymal atrophy with commensurate dilatation of the ventricles and sulci. Small old lacunar infarct noted within the right basal ganglia region. There is no mass, hemorrhage, edema or other evidence of acute parenchymal abnormality. No extra-axial hemorrhage. Vascular: There are chronic calcified atherosclerotic changes of the large vessels at the skull base. No unexpected hyperdense vessel. Skull: Normal. Negative for fracture or focal lesion. Sinuses/Orbits: No acute finding. Other: None IMPRESSION: No acute findings.  No intracranial mass, hemorrhage or edema. Electronically Signed   By: Franki Cabot M.D.   On: 08/28/2016 19:27   Dg Chest Port 1 View  Result Date: 08/27/2016 CLINICAL DATA:  60 year old female with a history of mitral valve repair EXAM: PORTABLE CHEST 1 VIEW COMPARISON:  08/27/2016, 08/26/2016, 08/25/2016 FINDINGS: Cardiomediastinal silhouette unchanged. Surgical changes of prior median sternotomy and mitral valve annuloplasty. Unchanged position of right-sided thoracostomy tube, gastric tube, right subclavian central line. Unchanged position of the endotracheal tube. Unchanged left-sided IJ central venous catheter. Unchanged position of mediastinal drain and epicardial pacing leads. Interval removal of right IJ sheath and Swan-Ganz catheter. Minimal interstitial airspace opacities at the lung bases. Blunting of left costophrenic angle with pleural parenchymal thickening. No pneumothorax. IMPRESSION: Interval removal of right IJ sheath and Swan-Ganz catheter. Mild airspace disease at the bases with likely trace left pleural effusion. Unchanged endotracheal tube, gastric tube, right subclavian central line, left IJ catheter, mediastinal/pleural  drains, and epicardial pacing leads. Surgical changes of median sternotomy and mitral valve annuloplasty. Signed, Dulcy Fanny. Earleen Newport, DO [facility] Electronically Signed   By: Corrie Mckusick D.O.   On: 08/27/2016 17:12   Dg Chest Port 1 View  Result Date: 08/26/2016 CLINICAL DATA:  Status post cardiac surgery and valve replacement. EXAM: PORTABLE CHEST 1 VIEW COMPARISON:  08/25/2016 FINDINGS: Changes from the recent cardiac surgery and mitral valve replacement are stable from most recent prior exam. Cardiac silhouette is mildly enlarged. There  is no mediastinal widening. Mild lower lung zone hazy opacity is noted consistent with a combination of small pleural effusions and atelectasis. Mild interstitial thickening is noted centrally which has improved. There was no overt pulmonary edema. No pneumothorax. Endotracheal tube, right internal jugular Swan-Ganz catheter, left internal jugular dual-lumen central venous catheter, nasal/orogastric tube, right chest tube and mediastinal tube are stable in well positioned. Right subclavian central venous line catheter tip projects in the right atrium, also stable. IMPRESSION: 1. Improved lung aeration when compared the prior exam consistent with improved pulmonary edema. 2. Lung base opacity noted consistent with small effusions and atelectasis. 3. No pneumothorax.  No mediastinal widening. 4. Support apparatus is stable. Electronically Signed   By: Lajean Manes M.D.   On: 08/26/2016 08:03   Dg Chest Port 1 View  Result Date: 08/25/2016 CLINICAL DATA:  Status post mitral valve replacement EXAM: PORTABLE CHEST 1 VIEW COMPARISON:  08/24/2016 FINDINGS: Cardiac shadow is stable. Intra-aortic balloon pump is again noted just below the aortic knob. Changes of mitral valve replacement are again seen. Swan-Ganz catheter is noted in the pulmonary outflow tract. Temporary dialysis catheter is noted via the left jugular  approach. Right subclavian central line is noted as well. Endotracheal tube and mediastinal drain are again noted. The degree of vascular congestion has improved somewhat in the interval from the prior exam although persists. Bilateral pleural effusions right greater than left are noted. No focal confluent infiltrate is seen. IMPRESSION: Persistent but improved vascular congestion with bilateral effusions. Tubes and lines as described. Electronically Signed   By: Inez Catalina M.D.   On: 08/25/2016 08:40   Dg Chest Port 1 View  Result Date: 08/24/2016 CLINICAL DATA:  Mitral valve replacement, chest tube EXAM: PORTABLE CHEST 1 VIEW COMPARISON:  08/23/2016 FINDINGS: PA support devices including right chest tube, endotracheal tube Swan-Ganz catheter are unchanged. No pneumothorax. Diffuse interstitial prominence throughout the lungs is increased, likely mild edema. Mild cardiomegaly. Left base atelectasis crash that left base atelectasis has increased. IMPRESSION: Increasing interstitial prominence throughout the lungs, likely worsening interstitial edema. Increasing left base atelectasis. Electronically Signed   By: Rolm Baptise M.D.   On: 08/24/2016 08:41   Dg Chest Portable 1 View  Result Date: 08/23/2016 CLINICAL DATA:  Hypoxia EXAM: PORTABLE CHEST 1 VIEW COMPARISON:  Study obtained earlier in the day FINDINGS: Endotracheal tube tip is 2.6 cm above the carina. Central catheter placed from the right side has its tip in the right atrium slightly beyond the cavoatrial junction. Swan-Ganz catheter tip is in the proximal right main pulmonary artery. Left jugular catheter tip is in the superior vena cava. Nasogastric tube is no longer apparent. Temporary pacemaker wires are attached to the right heart. No pneumothorax. There is perihilar and central interstitial edema. No airspace consolidation. Heart is mildly enlarged. There is mild pulmonary venous hypertension. Prosthetic cardiac valve present. No adenopathy  evident. IMPRESSION: Tube and catheter positions as described without pneumothorax. Note that the new catheter placed on the right side has its tip in the right atrium slightly beyond the cavoatrial junction. There is interstitial edema with mild cardiomegaly and mild pulmonary venous hypertension. No airspace consolidation appreciable. Electronically Signed   By: Lowella Grip III M.D.   On: 08/23/2016 15:34   Dg Chest Port 1 View  Result Date: 08/23/2016 CLINICAL DATA:  Respiratory failure. Acute on chronic diastolic heart failure. Cardiogenic shock. EXAM: PORTABLE CHEST 1 VIEW COMPARISON:  08/22/2016, 08/21/2016 and 08/20/2016 and 08/15/2016 FINDINGS: Endotracheal tube, NG  tube and right and left jugular vein central catheters appear in good position, unchanged. Intra aortic balloon pump in place. Heart size and pulmonary vascularity are now normal. Small bilateral pleural effusions. Slight haziness at the lung bases is most likely secondary to the effusions although I think there is an element of slight residual pulmonary edema as well. IMPRESSION: Improving pulmonary edema.  Persistent small bilateral effusions. Electronically Signed   By: Lorriane Shire M.D.   On: 08/23/2016 07:44   Dg Chest Port 1 View  Result Date: 08/22/2016 CLINICAL DATA:  Heart failure.  Balloon pump advanced. EXAM: PORTABLE CHEST 1 VIEW COMPARISON:  08/22/2016 at 4:50 a.m. FINDINGS: Metallic tip of the balloon pump projects over the aortic knob, well positioned. Endotracheal tube, right and left internal jugular central venous lines, femoral a placed Swan-Ganz catheter and nasal/orogastric tube are stable in well positioned. Lung aeration appears mildly improved although the difference may be technical only. There still vascular congestion and interstitial and hazy airspace opacity consistent with asymmetric pulmonary edema. Small pleural effusions are noted. IMPRESSION: 1. Intra-aortic balloon pump tip projects over the  aortic knob, well positioned. 2. Possible mild improvement in lung aeration since the earlier study although the difference may be technical only. No other evidence of a change. Electronically Signed   By: Lajean Manes M.D.   On: 08/22/2016 16:34   Dg Chest Port 1 View  Result Date: 08/22/2016 CLINICAL DATA:  Respiratory failure, shortness of Breath EXAM: PORTABLE CHEST 1 VIEW COMPARISON:  08/21/2016 FINDINGS: Support devices are stable. Cardiomegaly. Bilateral perihilar and lower lobe opacities throughout the lungs have increased slightly since prior study, likely mild edema. Suspect small layering effusions. IMPRESSION: Mild interval worsening of pulmonary edema/ CHF. Small layering effusions. Electronically Signed   By: Rolm Baptise M.D.   On: 08/22/2016 07:30   Dg Chest Port 1 View  Result Date: 08/21/2016 CLINICAL DATA:  Acute hypoxemic respiratory failure EXAM: PORTABLE CHEST 1 VIEW COMPARISON:  08/20/2016 FINDINGS: Support devices are stable. Endotracheal tube remains at the level of the carina. Borderline heart size. Bilateral airspace opacities are again noted, likely edema. Findings stable or slightly worsened since prior study. Possible small right effusion. IMPRESSION: Slight interval worsening in pulmonary edema pattern. Small right effusion. Endotracheal tube remains at the level of the carina and could be retracted 2-3 cm for optimal positioning. Electronically Signed   By: Rolm Baptise M.D.   On: 08/21/2016 07:40   Dg Chest Port 1 View  Result Date: 08/20/2016 CLINICAL DATA:  Pt was hypotensive with respiratory failure x a few days ago. Hx of DM, and HTN. Pt had a cardiac cath on 08-15-16. EXAM: PORTABLE CHEST 1 VIEW COMPARISON:  08/19/2016 FINDINGS: Endotracheal to appears low at the level of the carina. LEFT and RIGHT central venous line are unchanged. Swan-Ganz catheter from an inferior approach unchanged. There is some improvement aeration lung bases. Mild pulmonary edema remains. No  pneumothorax. IMPRESSION: 1. Endotracheal tube appears low at the level the carina. 2. Stable support apparatus. 3. Slight improved aeration lung bases with persistent mild edema. Electronically Signed   By: Suzy Bouchard M.D.   On: 08/20/2016 07:58   Dg Chest Port 1 View  Result Date: 08/19/2016 CLINICAL DATA:  Pulmonary edema EXAM: PORTABLE CHEST 1 VIEW COMPARISON:  08/18/2016 FINDINGS: External pad artifact. Endotracheal tube, central venous lines, and NG tube unchanged. Swan-Ganz catheter and intra aortic balloon pump unchanged. Stable cardiac silhouette. Mild central venous pulmonary congestion. No overt pulmonary  edema. No pneumothorax. IMPRESSION: 1. Stable support apparatus. 2. Central venous congestion.  No significant change. Electronically Signed   By: Suzy Bouchard M.D.   On: 08/19/2016 07:55   Dg Chest Port 1 View  Result Date: 08/18/2016 CLINICAL DATA:  Placement of femoral Swan-Ganz catheter and intra-aortic balloon pump catheter. EXAM: PORTABLE CHEST 1 VIEW 4:14 p.m.: COMPARISON:  Portable chest x-ray earlier today 3:37 p.m. and previously. FINDINGS: Femoral Swan-Ganz catheter tip projects at the expected location of the right middle lobe pulmonary artery. Intra-aortic balloon pump catheter tip projects over the proximal descending thoracic aorta. Endotracheal tube tip now projects approximately 2-3 cm above the carina. Right jugular central venous catheter tip projects over the upper SVC. Left jugular central venous catheter tip projects over the lower SVC. Nasogastric tube courses below the diaphragm into the stomach. Improved aeration in the left lower lobe since the most recent examination 45 minutes earlier, though streaky opacities persist. Improved perihilar airspace pulmonary edema in both lungs. No new pulmonary parenchymal abnormalities. IMPRESSION: 1. Swan-Ganz catheter tip projects at the expected location of the right middle lobe pulmonary artery. 2. Intra-aortic balloon  pump catheter tip projects over the proximal descending thoracic aorta. 3. Remaining support apparatus satisfactory. 4. Improved aeration in both lungs since the examination performed 45 minutes ago, although streaky left lower lobe atelectasis and/or pneumonia and mild perihilar airspace pulmonary edema persists. 5. No new abnormalities. Electronically Signed   By: Evangeline Dakin M.D.   On: 08/18/2016 16:43   Dg Chest Port 1 View  Result Date: 08/18/2016 CLINICAL DATA:  ST-elevation myocardial infarction and cardiogenic shock. EXAM: PORTABLE CHEST 1 VIEW COMPARISON:  Yesterday FINDINGS: Endotracheal tube tip 1 cm above the carina. Central line on the right and dialysis catheter on the left with tips at the lower SVC. Orogastric tube reaches the stomach at least. Stable borderline heart size. Worsening aeration at the left base with newly obscured diaphragm. Perihilar airspace disease. IMPRESSION: 1. Lower endotracheal tube with tip 1 cm above the carina. 2. New atelectasis or consolidation in the left lower lobe with small effusion. 3. Pulmonary edema. Electronically Signed   By: Monte Fantasia M.D.   On: 08/18/2016 07:02   Dg Chest Port 1 View  Result Date: 08/17/2016 CLINICAL DATA:  Central line placement EXAM: PORTABLE CHEST 1 VIEW COMPARISON:  Earlier same day FINDINGS: Endotracheal tube is been withdrawn with the tip 2.5 cm proximal to the carina. Right internal jugular central line remains in place with its tip in the SVC above the right atrium. New dual-lumen left internal jugular central line has its tip in the SVC above the right atrium. No pneumothorax. Bilateral bat wing airspace filling is seen consistent with acute edema or acute airspace filling due to other etiologies. No effusions. No collapse. IMPRESSION: Left IJ central line well position with tip in the SVC above the right atrium. No pneumothorax. Endotracheal tube repositioned, well positioned with tip 2.5 cm above the carina.  Persistent worsen bat wing edema or airspace filling pattern. Electronically Signed   By: Nelson Chimes M.D.   On: 08/17/2016 10:35   Dg Chest Port 1 View  Result Date: 08/15/2016 CLINICAL DATA:  Central line placement EXAM: PORTABLE CHEST 1 VIEW COMPARISON:  August 15, 2016 7:04 p.m. FINDINGS: Right central venous line is identified with distal tip in superior vena cava. There is no pneumothorax. The heart size mildly enlarged. Diffuse airspace opacities identified throughout both lungs. The bones are unchanged. IMPRESSION: Right central venous  line distal tip in the superior vena cava. There is no pneumothorax. Pulmonary edema unchanged compared prior exam. Electronically Signed   By: Abelardo Diesel M.D.   On: 08/15/2016 21:10   Dg Chest Port 1v Same Day  Result Date: 08/27/2016 CLINICAL DATA:  Post MITRAL VALVE REPAIR (MVR), INTRAOPERATIVE TRANSESOPHAGEAL ECHOCARDIOGRAM, ENDOVEIN HARVEST OF GREATER SAPHENOUS VEIN, CENTRAL LINE INSERTION on 08/23/2016. H/O diabetes, hypertension. Pt is intubated and on 24/7 dialysis. Non smoker EXAM: PORTABLE CHEST 1 VIEW COMPARISON:  08/26/2016 FINDINGS: right IJ Swan-Ganz catheter unchanged. There is also a left-sided internal jugular dialysis catheter which terminates at the mid SVC. Endotracheal tube terminates 3.3 cm above carina.Nasogastric and feeding tubes extend beyond the inferior aspect of the film. Mediastinal drain and right-sided chest tube. Cardiomegaly accentuated by AP portable technique. No pleural effusion or pneumothorax. Persistent mild to moderate interstitial edema. Bibasilar airspace disease. Right-sided subclavian line is also unchanged. IMPRESSION: No significant change since the prior exam. Cardiomegaly with mild to moderate interstitial edema and bibasilar airspace disease. Stable support apparatus, without pneumothorax. Electronically Signed   By: [name] Miyamoto M.D.   On: 08/27/2016 09:06   Dg Abd Portable 1v  Result Date:  08/25/2016 CLINICAL DATA:  Feeding tube placement. EXAM: PORTABLE ABDOMEN - 1 VIEW COMPARISON:  08/22/2016. FINDINGS: Feeding tube noted with its tip projected over the distal stomach/ proximal duodenum. NG tube noted in stable position with tip over the stomach. Left femoral catheter noted. Right femoral sheath in stable position . Aortic balloon pump tip noted L3. No bowel distention. IMPRESSION: Interim placement of feeding tube, its tip is projected over the distal stomach/ duodenum. Remaining lines and tubes including aortic balloon pump in stable position . Electronically Signed   By: Marcello Moores  Register   On: 08/25/2016 12:50   Dg Abd Portable 1v  Result Date: 08/22/2016 CLINICAL DATA:  Balloon pump position EXAM: PORTABLE ABDOMEN - 1 VIEW COMPARISON:  Chest x-ray 08/22/2016.  KUB 08/17/2016 FINDINGS: Right femoral Swan-Ganz catheter extends above the diaphragm . Intra-aortic balloon pump marker appears overlying the L3 vertebral body in previously was in the thoracic aorta. Left femoral venous catheter in the left iliac vein. NG tube in the stomach. Normal bowel gas pattern. IMPRESSION: Intra-aortic balloon pump marker  overlies the L3 vertebral body. Electronically Signed   By: Franchot Gallo M.D.   On: 08/22/2016 13:12   Dg Abd Portable 1v  Result Date: 08/19/2016 CLINICAL DATA:  Acidosis EXAM: PORTABLE ABDOMEN - 1 VIEW COMPARISON:  08/17/2016 FINDINGS: No dilated loops large or small bowel. NG tube in stomach. Swan-Ganz catheter noted LEFT basilar atelectasis. The kidneys are dense suggesting retained IV contrast . IMPRESSION: 1. Normal bowel-gas pattern. 2. Retained contrast in the kidneys consistent consistent with acute kidney injury. Electronically Signed   By: Suzy Bouchard M.D.   On: 08/19/2016 08:02   US Abdomen Limited Ruq  Result Date: 08/26/2016 CLINICAL DATA:  Acute onset of abnormal LFTs.  Initial encounter. EXAM: US ABDOMEN LIMITED - RIGHT UPPER QUADRANT COMPARISON:  None.  FINDINGS: Gallbladder: Gallbladder wall thickening is noted, measuring up to 5 mm. Vague internal echoes are seen within the gallbladder, suggestive of underlying sludge. No definite stones are identified. No pericholecystic fluid is seen. Evaluation for ultrasonographic Murphy's sign is not possible, as the patient is sedated and on ventilation. Common bile duct: Diameter: 0.5 cm, within normal limits in caliber. Liver: No focal lesion identified. Within normal limits in parenchymal echogenicity. The left hepatic lobe is not assessed as  the patient has overlying bandages, due to recent CABG and mitral valve replacement. A small right-sided pleural effusion is noted. IMPRESSION: 1. Gallbladder wall thickening noted. Suggestion of underlying sludge filling the gallbladder. No definite evidence of cholelithiasis. Findings may reflect mild chronic inflammation. No pericholecystic fluid is seen to suggest acute cholecystitis. 2. Small right-sided pleural effusion noted. 3. Liver grossly unremarkable in appearance, though the left hepatic lobe is not imaged on this study. Electronically Signed   By: Garald Balding M.D.   On: 08/26/2016 18:29    PHYSICAL EXAM General: Intubated/sedated Neck: RIJ TLC LIJ trialysis  Lungs: Clear anteriorly CV: Nondisplaced PMI.  Heart regular S1/S2, no S3/S4, 3/6 HSM apex.  Trace  peripheral edema.   Abdomen: Soft, no hepatosplenomegaly, no distention.  Neurologic: Sedated.  Not responding to pain  Extremities: No clubbing or cyanosis. 2+ edema GU: Foley    TELEMETRY: Sinus tach  Assessment:   1. Cardiogenic shock: Due to acute severe MR.  2. CAD: Inferior posterior STEMI on 9/13 with DES x 2 to OM-1      --Plavix restarted on 9/22 3. 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     --s/p MVR 4. AKI on CKD stage III - due to ATN likely from cardiogenic shock + contrast with cath. Has required  CVVH.  5. Acute respiratory failure: Pulmonary edema.  6. DM2 7. Suspect component of septic shock: Possible PNA, group B Strep in sputum culture.  8. Anemia 9. PVCs   10. 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 S/P MVR 08/23/16  Weaning pressors. Will continue to wean as tolerated.      Unable to wean from vent due to mental status - will likely need trach. CCM managing. Exam concerning for R hemiplegia. Head CT unremarkable. Continue to assess.   Ectopy stabilizing - stop lido today.   Back on Plavix with recent stent.   WBC persistently elevated. Swan and Foley out. Now on day #2  Vanc/zosyn + fluconazole.   I spoke with daughter at length by telephone  and explained that we are very concerned about overall prognosis with multi-system organ failure. I also discussed that she would likely need trach and dialysis if she survives.    The patient is critically ill with multiple organ systems failure and requires high complexity decision making for assessment and support, frequent evaluation and titration of therapies, application of advanced monitoring technologies and extensive interpretation of multiple databases.   Critical Care Time devoted to patient care services described in this note is 35 Minutes.  Bensimhon, Daniel,MD 8:29 AM

## 2016-08-29 NOTE — Care Management Note (Addendum)
Case Management Note  Patient Details  Name: Deborah [name] MRN: [id number] Date of Birth: 09/09/1956   Subjective/Objective:    Copied from previous CM note from [place] 08/17/16            RNCM met with patient's son [name] [phone] and daughter [name] [phone] to discuss discharge planning. Patient is vented currently and son [name] is struggling with patient's current status. I and patient's RN spoke with him to try to comfort him- he appreciate it. [name] would like to be first line of contact for any medical needs. Patient is from home with [name]. She was independent with daily activity. Her PCP is Ronette Deter. Patient had been going to [facility] but per [name] "they were not meeting her mother's medical needs". [name] has been paying for physician visits for her mother. Patient is potentially going to need outpatient dialysis. She was not taking her medications per [name] due to cost- patient does not have health insurance. [name] wants her mother to have home health services- [facility] nursing may be appropriate however it is too soon to make those arrangements. Both children are considering SNF if patient too weak to return home. They have met with financial advisors about Medicare and Medicaid per Teresa/Frank.   Action/Plan: Application to [facility] and Medication Management shared with [name]. RNCM to continue to follow.   Expected Discharge Date:                  Expected Discharge Plan:  [place]  In-House Referral:     Discharge planning Services     Post Acute Care Choice:    Choice offered to:     DME Arranged:    DME Agency:     HH Arranged:    [place] Agency:     Status of Service:  In process, will continue to follow  If discussed at Long Length of Stay Meetings, dates discussed:    Additional Comments: 09/01/2016 Elenor Quinones, RN, BSN [phone] Pt is now extubated on [place],  remains on CRRT, continuous Milrinone, Amiodarone, Precedex d/c today at extubation.   Per Development worker, community note ; medicaid application has been submitted  08/29/2016  Elenor Quinones, RN, BSN [phone] Discussed in LOS 08/29/16:  Pt remains appropriate for continued stay.  Pt remains on CRRT and ventilator.  Per attending; If no significant improvement in 24-48hrs, anticipate pt will need a trache/PEG.  She still has volume issues and her decreased mental status make it difficult to wean/extubate safely.  08/24/16 Elenor Quinones, RN, BSN [phone] Pt now transferred to 2S s/p  MVR.  Postoperative management of balloon pump, vasopressors, and chest tube per CV S and cardiology. Patient remains critically ill on ventilator - may need CRRT.  CM will continue to follow for discharge needs Maryclare Labrador, RN 08/29/2016, 11:49 AM

## 2016-08-30 ENCOUNTER — Inpatient Hospital Stay (HOSPITAL_COMMUNITY): Payer: Medicaid Other

## 2016-08-30 LAB — COMPREHENSIVE METABOLIC PANEL
ALT: 179 U/L — ABNORMAL HIGH (ref 14–54)
AST: 55 U/L — ABNORMAL HIGH (ref 15–41)
Albumin: 2.2 g/dL — ABNORMAL LOW (ref 3.5–5.0)
Alkaline Phosphatase: 146 U/L — ABNORMAL HIGH (ref 38–126)
Anion gap: 9 (ref 5–15)
BUN: 21 mg/dL — ABNORMAL HIGH (ref 6–20)
CO2: 25 mmol/L (ref 22–32)
Calcium: 7.9 mg/dL — ABNORMAL LOW (ref 8.9–10.3)
Chloride: 102 mmol/L (ref 101–111)
Creatinine, Ser: 1.49 mg/dL — ABNORMAL HIGH (ref 0.44–1.00)
GFR calc Af Amer: 43 mL/min — ABNORMAL LOW (ref >60)
GFR calc non Af Amer: 37 mL/min — ABNORMAL LOW (ref >60)
Glucose, Bld: 179 mg/dL — ABNORMAL HIGH (ref 65–99)
Potassium: 4.1 mmol/L (ref 3.5–5.1)
Sodium: 136 mmol/L (ref 135–145)
Total Bilirubin: 1.1 mg/dL (ref 0.3–1.2)
Total Protein: 6 g/dL — ABNORMAL LOW (ref 6.5–8.1)

## 2016-08-30 LAB — GLUCOSE, CAPILLARY
GLUCOSE-CAPILLARY: 240 mg/dL — AB (ref 65–99)
GLUCOSE-CAPILLARY: 107 mg/dL — AB (ref 65–99)
Glucose-Capillary: 160 mg/dL — ABNORMAL HIGH (ref 65–99)
Glucose-Capillary: 165 mg/dL — ABNORMAL HIGH (ref 65–99)
Glucose-Capillary: 167 mg/dL — ABNORMAL HIGH (ref 65–99)
Glucose-Capillary: 183 mg/dL — ABNORMAL HIGH (ref 65–99)

## 2016-08-30 LAB — CBC
HCT: 26 % — ABNORMAL LOW (ref 36.0–46.0)
Hemoglobin: 8.6 g/dL — ABNORMAL LOW (ref 12.0–15.0)
MCH: 29.3 pg (ref 26.0–34.0)
MCHC: 33.1 g/dL (ref 30.0–36.0)
MCV: 88.4 fL (ref 78.0–100.0)
Platelets: 152 10*3/uL (ref 150–400)
RBC: 2.94 MIL/uL — ABNORMAL LOW (ref 3.87–5.11)
RDW: 15.4 % (ref 11.5–15.5)
WBC: 21.2 10*3/uL — ABNORMAL HIGH (ref 4.0–10.5)

## 2016-08-30 LAB — RENAL FUNCTION PANEL
ALBUMIN: 2.8 g/dL — AB (ref 3.5–5.0)
Anion gap: 12 (ref 5–15)
BUN: 20 mg/dL (ref 6–20)
CHLORIDE: 101 mmol/L (ref 101–111)
CO2: 24 mmol/L (ref 22–32)
Calcium: 8.1 mg/dL — ABNORMAL LOW (ref 8.9–10.3)
Creatinine, Ser: 1.34 mg/dL — ABNORMAL HIGH (ref 0.44–1.00)
GFR, EST AFRICAN AMERICAN: 49 mL/min — AB (ref >60)
GFR, EST NON AFRICAN AMERICAN: 42 mL/min — AB (ref >60)
Glucose, Bld: 154 mg/dL — ABNORMAL HIGH (ref 65–99)
PHOSPHORUS: 2.9 mg/dL (ref 2.5–4.6)
Potassium: 3.7 mmol/L (ref 3.5–5.1)
SODIUM: 137 mmol/L (ref 135–145)

## 2016-08-30 LAB — POCT I-STAT 3, ART BLOOD GAS (G3+)
Acid-Base Excess: 2 mmol/L (ref 0.0–2.0)
Bicarbonate: 25.3 mmol/L (ref 20.0–28.0)
O2 SAT: 99 %
PCO2 ART: 35.7 mmHg (ref 32.0–48.0)
Patient temperature: 98.8
TCO2: 26 mmol/L (ref 0–100)
pH, Arterial: 7.459 — ABNORMAL HIGH (ref 7.350–7.450)
pO2, Arterial: 112 mmHg — ABNORMAL HIGH (ref 83.0–108.0)

## 2016-08-30 LAB — CARBOXYHEMOGLOBIN
CARBOXYHEMOGLOBIN: 1.3 % (ref 0.5–1.5)
METHEMOGLOBIN: 1.2 % (ref 0.0–1.5)
O2 SAT: 52.9 %
Total hemoglobin: 9 g/dL — ABNORMAL LOW (ref 12.0–16.0)

## 2016-08-30 LAB — CULTURE, BLOOD (ROUTINE X 2)

## 2016-08-30 LAB — POCT ACTIVATED CLOTTING TIME
ACTIVATED CLOTTING TIME: 169 s
ACTIVATED CLOTTING TIME: 180 s
ACTIVATED CLOTTING TIME: 186 s
ACTIVATED CLOTTING TIME: 197 s
ACTIVATED CLOTTING TIME: 202 s
ACTIVATED CLOTTING TIME: 213 s
ACTIVATED CLOTTING TIME: 224 s
ACTIVATED CLOTTING TIME: 230 s
ACTIVATED CLOTTING TIME: 235 s
Activated Clotting Time: 208 seconds
Activated Clotting Time: 180 seconds

## 2016-08-30 LAB — MAGNESIUM: MAGNESIUM: 2.2 mg/dL (ref 1.7–2.4)

## 2016-08-30 LAB — PROCALCITONIN: PROCALCITONIN: 4.9 ng/mL

## 2016-08-30 LAB — PHOSPHORUS: PHOSPHORUS: 1.9 mg/dL — AB (ref 2.5–4.6)

## 2016-08-30 MED ORDER — AMIODARONE HCL IN DEXTROSE 360-4.14 MG/200ML-% IV SOLN
30.0000 mg/h | INTRAVENOUS | Status: DC
  Administered 2016-08-31 – 2016-09-10 (×22): 30 mg/h via INTRAVENOUS
  Filled 2016-08-30 (×23): qty 200

## 2016-08-30 MED ORDER — HYDROCODONE-ACETAMINOPHEN 7.5-325 MG/15ML PO SOLN
15.0000 mL | ORAL | Status: DC | PRN
  Administered 2016-08-30 – 2016-10-06 (×16): 15 mL via ORAL
  Filled 2016-08-30 (×18): qty 15

## 2016-08-30 MED ORDER — SODIUM PHOSPHATES 45 MMOLE/15ML IV SOLN
30.0000 mmol | Freq: Once | INTRAVENOUS | Status: AC
  Administered 2016-08-30: 30 mmol via INTRAVENOUS
  Filled 2016-08-30: qty 10

## 2016-08-30 MED ORDER — ALBUMIN HUMAN 25 % IV SOLN
25.0000 g | Freq: Three times a day (TID) | INTRAVENOUS | Status: AC
Start: 2016-08-30 — End: 2016-08-31
  Administered 2016-08-30 – 2016-08-31 (×3): 25 g via INTRAVENOUS
  Filled 2016-08-30 (×3): qty 100

## 2016-08-30 MED ORDER — FENTANYL CITRATE (PF) 100 MCG/2ML IJ SOLN
50.0000 ug | Freq: Four times a day (QID) | INTRAMUSCULAR | Status: DC | PRN
  Administered 2016-08-30 – 2016-08-31 (×3): 50 ug via INTRAVENOUS
  Filled 2016-08-30 (×3): qty 2

## 2016-08-30 MED ORDER — LORAZEPAM 2 MG/ML IJ SOLN
0.5000 mg | INTRAMUSCULAR | Status: DC | PRN
Start: 2016-08-30 — End: 2016-09-02
  Administered 2016-08-31 – 2016-09-01 (×5): 0.5 mg via INTRAVENOUS
  Filled 2016-08-30 (×5): qty 1

## 2016-08-30 MED ORDER — AMIODARONE HCL IN DEXTROSE 360-4.14 MG/200ML-% IV SOLN
60.0000 mg/h | INTRAVENOUS | Status: AC
  Administered 2016-08-30: 60 mg/h via INTRAVENOUS
  Filled 2016-08-30: qty 200

## 2016-08-30 NOTE — Progress Notes (Signed)
7 Days Post-Op Procedure(s) (LRB): MITRAL VALVE REPAIR (MVR) USING 25MM EDWARDS MAGNA EASE BIOPROSTHESIS MITRAL  VALVE (N/A) INTRAOPERATIVE TRANSESOPHAGEAL ECHOCARDIOGRAM (N/A) ENDOVEIN HARVEST OF GREATER SAPHENOUS VEIN (Left) CENTRAL LINE INSERTION RIGHT SUBCLAVIAN (Right) Subjective: Off pressors More alert WBC improved Starting PS weaning afib this am Objective: Vital signs in last 24 hours: Temp:  [96.5 F (35.8 C)-98.8 F (37.1 C)] 97.7 F (36.5 C) (09/27 0748) Pulse Rate:  [98-147] 115 (09/27 0748) Cardiac Rhythm: Atrial flutter (09/27 0000) Resp:  [13-39] 17 (09/27 0748) BP: (84-168)/(51-95) 127/76 (09/27 0748) SpO2:  [98 %-100 %] 100 % (09/27 0748) Arterial Line BP: (87-185)/(56-96) 102/60 (09/27 0700) FiO2 (%):  [40 %] 40 % (09/27 0748) Weight:  [156 lb 12 oz (71.1 kg)] 156 lb 12 oz (71.1 kg) (09/27 0312)  Hemodynamic parameters for last 24 hours: CVP:  [6 mmHg-14 mmHg] 10 mmHg  Intake/Output from previous day: 09/26 0701 - 09/27 0700 In: 3559.7 [I.V.:901.7; NG/GT:1060; IV Piggyback:1598] Out: 4136 [Emesis/NG output:100; Chest Tube:10] Intake/Output this shift: Total I/O In: 148.7 [I.V.:15.7; NG/GT:40; IV Piggyback:93] Out: -        Exam    General- alert and comfortable   Lungs- clear without rales, wheezes   Cor- regular rate and rhythm, no murmur , gallop   Abdomen- soft, non-tender   Extremities - warm, non-tender, minimal edema   Neuro- oriented, appropriate, no focal weakness   Lab Results:  Recent Labs  08/29/16 1900 08/30/16 0324  WBC 29.2* 21.2*  HGB 9.0* 8.6*  HCT 26.8* 26.0*  PLT 143* 152   BMET:  Recent Labs  08/29/16 1715 08/30/16 0324  NA 134* 136  K 4.2 4.1  CL 104 102  CO2 25 25  GLUCOSE 192* 179*  BUN 20 21*  CREATININE 1.40* 1.49*  CALCIUM 7.6* 7.9*    PT/INR: No results for input(s): LABPROT, INR in the last 72 hours. ABG    Component Value Date/Time   PHART 7.459 (H) 08/30/2016 0330   HCO3 25.3 08/30/2016 0330    TCO2 26 08/30/2016 0330   ACIDBASEDEF 3.0 (H) 08/26/2016 1615   O2SAT 52.9 08/30/2016 0330   O2SAT 99.0 08/30/2016 0330   CBG (last 3)   Recent Labs  08/29/16 2353 08/30/16 0350 08/30/16 0758  GLUCAP 184* 165* 160*    Assessment/Plan: S/P Procedure(s) (LRB): MITRAL VALVE REPAIR (MVR) USING 25MM EDWARDS MAGNA EASE BIOPROSTHESIS MITRAL  VALVE (N/A) INTRAOPERATIVE TRANSESOPHAGEAL ECHOCARDIOGRAM (N/A) ENDOVEIN HARVEST OF GREATER SAPHENOUS VEIN (Left) CENTRAL LINE INSERTION RIGHT SUBCLAVIAN (Right) Vent wean - CXR looks good- if not extubated this week she   needs trach FRi Start iv amio for afib- shock liver much better  LOS: 12 days    Tharon Aquas Trigt III 08/30/2016

## 2016-08-30 NOTE — Progress Notes (Signed)
PULMONARY / CRITICAL CARE MEDICINE   Name: Deborah [name] MRN: [id number] DOB: [birth year]    ADMISSION DATE:  08/18/2016 CONSULTATION DATE:  08/18/2016  REFERRING MD:  Bensimhon   CHIEF COMPLAINT:  Ventilator management and critical care services   HISTORY OF PRESENT ILLNESS:   60 yo female admitted to [facility] 08/15/16 with STEMI s/p cath to [place].  Developed cardiogenic shock, VDRF (ETT 9/14), AKI with metabolic acidosis and started on CRRT 08/17/16.  Transferred to [facility] to tx shock and evaluated new severe MR from papillary muscle rupture.  She also had fever and found to have Group B Strep in sputum culture. Mitral valve replacement 9/20.  SUBJECTIVE:  Off epi and levophed drips. Started on amiodarone for a fib. Tolerating CVVH  VITAL SIGNS: BP 127/76   Pulse (!) 115   Temp 97.7 F (36.5 C) (Axillary)   Resp 17   Ht 5\' 1"  (1.549 m) Comment: from 08/15/16 encounter  Wt 156 lb 12 oz (71.1 kg)   SpO2 100%   BMI 29.62 kg/m   HEMODYNAMICS: CVP:  [6 mmHg-14 mmHg] 10 mmHg  VENTILATOR SETTINGS: Vent Mode: PSV;CPAP FiO2 (%):  [40 %] 40 % Set Rate:  [16 bmp] 16 bmp Vt Set:  [400 mL] 400 mL PEEP:  [5 cmH20] 5 cmH20 Pressure Support:  [5 cmH20] 5 cmH20  INTAKE / OUTPUT: I/O last 3 completed shifts: In: 4864.8 [I.V.:1766.8; NG/GT:1450; IV WNUUVOZDG:6440] Out: 3474 [Emesis/NG output:150; QVZDG:3875; Chest Tube:10]  PHYSICAL EXAMINATION: General: Mild distress, agitated HENT: NCAT ETT in place. R IJ TLC and L IJ HD cath Neuro: Rt sided gaze preference, Rt weakness LE > UE. Follows simple commands PULM: Clear, No wheeze or crakles CV: Soft, S1, S2, Systolic murmur. GI: Diminished bowel sounds. Soft. (-) masses/tenderness Ext 1-2+ edema  LABS:  BMET  Recent Labs Lab 08/29/16 1550 08/29/16 1715 08/30/16 0324  NA 137 134* 136  K 3.3* 4.2 4.1  CL 111 104 102  CO2 20* 25 25  BUN 17 20 21*  CREATININE 1.11* 1.40* 1.49*  GLUCOSE 157* 192* 179*    Electrolytes  Recent  Labs Lab 08/28/16 0429  08/29/16 0431  08/29/16 1550 08/29/16 1715 08/30/16 0324  CALCIUM 7.4*  < > 7.7*  < > 6.1* 7.6* 7.9*  MG 2.3  --  2.3  --   --   --  2.2  PHOS 2.4*  < >  --   < > 2.3* 2.8 1.9*  < > = values in this interval not displayed.  CBC  Recent Labs Lab 08/29/16 0431 08/29/16 1900 08/30/16 0324  WBC 30.1* 29.2* 21.2*  HGB 9.5* 9.0* 8.6*  HCT 28.1* 26.8* 26.0*  PLT 150 143* 152    Coag's  Recent Labs Lab 08/23/16 1553 08/27/16 0321 08/28/16 0429 08/29/16 0431  APTT 40* 123* 156* 194*  INR 1.50  --   --   --     Sepsis Markers  Recent Labs Lab 08/28/16 0909 08/29/16 0431 08/30/16 0324  PROCALCITON 6.74 4.85 4.90    ABG  Recent Labs Lab 08/28/16 1817 08/29/16 0408 08/30/16 0330  PHART 7.425 7.487* 7.459*  PCO2ART 38.0 34.0 35.7  PO2ART 88.0 105.0 112.0*    Liver Enzymes  Recent Labs Lab 08/28/16 0429  08/29/16 0431  08/29/16 1550 08/29/16 1715 08/30/16 0324  AST 299*  --  139*  --   --   --  55*  ALT 387*  --  291*  --   --   --  179*  ALKPHOS 191*  --  184*  --   --   --  146*  BILITOT 0.9  --  0.9  --   --   --  1.1  ALBUMIN 1.9*  < > 1.8*  < > 1.6* 2.0* 2.2*  < > = values in this interval not displayed.  Cardiac Enzymes No results for input(s): TROPONINI, PROBNP in the last 168 hours.  Glucose  Recent Labs Lab 08/29/16 1138 08/29/16 1603 08/29/16 1921 08/29/16 2353 08/30/16 0350 08/30/16 0758  GLUCAP 187* 159* 183* 184* 165* 160*    Imaging Dg Chest Port 1 View  Result Date: 08/30/2016 CLINICAL DATA:  60 year old female status post mitral valve replacement. Initial encounter. EXAM: PORTABLE CHEST 1 VIEW COMPARISON:  08/29/2016 and earlier. FINDINGS: Portable AP semi upright view at 0628 hours. Stable endotracheal tube. Visible enteric tubes appear stable. Stable dual-lumen left IJ approach central line. Stable right subclavian approach central line. Stable cardiac size and mediastinal contours. Continued  veiling opacity at both lung bases an diffuse pulmonary vascular congestion. No pneumothorax. Stable ventilation since yesterday. IMPRESSION: 1.  Stable lines and tubes. 2. Stable ventilation since yesterday with pulmonary interstitial edema superimposed on bilateral pleural effusions and lower lobe collapse or consolidation. Electronically Signed   By: Genevie Ann M.D.   On: 08/30/2016 07:34   Dg Abd Portable 1v  Result Date: 08/29/2016 CLINICAL DATA:  Status post feeding tube placement EXAM: PORTABLE ABDOMEN - 1 VIEW COMPARISON:  Supine abdominal radiograph of August 25, 2016 FINDINGS: The tip of the feeding tube projects to the left of midline. This may have read curved upon itself and lie in the gastric body but may also lie in the fourth portion of the duodenum or at the proximal portion of the jejunum. The bowel gas pattern is unremarkable. IMPRESSION: The feeding tube tip lies in likely in the distal duodenum just proximal to the jejunum but the possibility of it having re- curved upon itself and still lie in the gastric body is raised. A cross-table lateral radiograph may be useful. Electronically Signed   By: David  Martinique M.D.   On: 08/29/2016 12:34    STUDIES:  LHC 9/12: PCI to OM1, severe MR, EF 55 to 60% RHC 9/15: RA 5, RV 37/3/6, PA 38/18/26, PCWP 15, CI 1.9, PVR 3.3 WU TEE 9/15: EF 60 to 65%, flail motion MV with severe MR Port CXR 9/21:  Tubes in lines in good position. Hazy bilateral opacities. Port CXR 9/23: Lines, tubes, and endotracheal tube in good position. Persistent pulmonary vascular congestion and effusions RUQ ultrasound 9/23 > Gallbladder wall thickening noted, suggestion of sludge, small R effusion, liver unremarkable but left lobe not well visulalized  MICROBIOLOGY: MRSA PCR 9/12:  Negative MRSA PCR 9/15:  Negative Tracheal Asp Ctx 9/15:  Strep agalactiae  Blood Ctx x2 9/15:  Negative  MRSA PCR 9/19:  Negative   ANTIBIOTICS: Zosyn 9/16 - 9/17 Vancomycin 9/16 -  9/17 Rocephin 9/17 >> 9/24 Vancomycin 9/20 (surgical prophylaxis) Vanc 9/25 > Zosyn 9/25 >  Fluconazole 9/25 >   SIGNIFICANT EVENTS: 9/12 Admit to [facility], PCI to [place] 9/14 ETT, CRRT 9/15 to [facility] 9/16 off insulin gtt 9/20 To OR for Mitral Valve Repair w/ bioprosthetic valve  LINES/TUBES: Right IJ PICC 9/12 - 9/20 Rt femoral introducer 9/15 - 9/20 OETT 8.0 9/14 >>  HD cath left IJ 9/14 >> R IJ Introducer/CVL/Swan 9/20 >>  9/25 IABP 9/15 >>9/22 Lt femoral a line 9/15 >> 9/25  Y Chest Tube/Mediastinal Chest Tube 9/20 >> PIV x1 L brachial art line 9/25 >  ASSESSMENT / PLAN:  PULMONARY A: Acute Hypoxic Respiratory Failure - Secondary to acute pulmonary edema.  P:   Cont vent support. Daily PST as tolerated.  Poor mental status still a barrier to wean > better today. VAP prevention bundle Chest tube Mgt per CVTS  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 Shock - Combination cardiogenic & septic  P:  Continuous Telemetry Monitoring Vitals per unit protocol Post-op Mgt per CVTS/Cardiology ASA, & Lopressor ordered  Amio, Milrinone gtt's per cardiology/CVTS  RENAL A:   Acute Renal Failure - Baseline creatinine 1.6-1.7  P:   Nephrology Following Continue volume removal per CVVHD > plan to remove HD cath later this week and will get a perm cath then Monitor BMET and UOP Replace electrolytes as needed  GASTROINTESTINAL A:   Transaminitis - improving, amio or statin effect Mild Malnutrition - Related to critical illness New onset a fib P:   Pepcid IV q12hr Cont TF Check LFTs later this week  HEMATOLOGIC A:   Anemia - Last transfusion 9/20 Leukocytosis - Multifactorial.  P:  Trending cell counts w/ CBC SCDs  INFECTIOUS A:   Group B Strep Pneumonia Leukocytosis P:   Fevers, leukocytosis better Vanc and zosyn restarted on 9/25 and Fluconazole started on 9/25.  PCT elevated (but with CKD) > cont to trend.   Re-culture for fever  ENDOCRINE A:   DM - Glucose controlled  P:   Subcutaneous insulin per CVTS Levemir BID  NEUROLOGIC A:   New onset R sided hemiplegia and Preferental gaze to Right (9/25). Cranial CT scan was (-) for acute changes.  Acute Encephalopathy - Multifactorial and likely combination toxic metabolic & hypoxia  P:   Observe neuro status. MRI when stable RASS goal: -1 Precedex gtt  Off versed IV PRN Fentanyl IV prn  No family at bedside.  Critical care time: 35 minutes.   Marshell Garfinkel MD [facility] Pager [phone] If no answer or after 3pm call: [phone] 08/30/2016, 11:00 AM

## 2016-08-30 NOTE — Progress Notes (Signed)
TCTS BRIEF SICU PROGRESS NOTE  7 Days Post-Op  S/P Procedure(s) (LRB): MITRAL VALVE REPAIR (MVR) USING 25MM EDWARDS MAGNA EASE BIOPROSTHESIS MITRAL  VALVE (N/A) INTRAOPERATIVE TRANSESOPHAGEAL ECHOCARDIOGRAM (N/A) ENDOVEIN HARVEST OF GREATER SAPHENOUS VEIN (Left) CENTRAL LINE INSERTION RIGHT SUBCLAVIAN (Right)   Stable day Afib w/ controlled rate BP stable Tolerating CVVHD  Plan: Continue current plan.  For tunneled dialysis catheter tomorrow  Rexene Alberts, MD 08/30/2016 5:38 PM

## 2016-08-30 NOTE — Progress Notes (Signed)
Pt has been agitated and has been fighting the vent due to sedation issues. The nurse called and asked for some sedation and the doctor order ativan. RT will continue to  monitor

## 2016-08-30 NOTE — Progress Notes (Signed)
Subjective: Interval History: entub , more awake.  Objective: Vital signs in last 24 hours: Temp:  [96.5 F (35.8 C)-98.8 F (37.1 C)] 97.7 F (36.5 C) (09/27 0400) Pulse Rate:  [98-147] 115 (09/27 0630) Resp:  [13-39] 21 (09/27 0630) BP: (84-168)/(58-95) 125/79 (09/27 0630) SpO2:  [98 %-100 %] 100 % (09/27 0630) Arterial Line BP: (87-185)/(56-96) 134/72 (09/27 0630) FiO2 (%):  [40 %] 40 % (09/27 0600) Weight:  [71.1 kg (156 lb 12 oz)] 71.1 kg (156 lb 12 oz) (09/27 0312) Weight change: -3.7 kg (-8 lb 2.5 oz)  Intake/Output from previous day: 09/26 0701 - 09/27 0700 In: 3500.1 [I.V.:882.1; NG/GT:1020; IV Piggyback:1598] Out: 3907 [Emesis/NG output:100; Chest Tube:10] Intake/Output this shift: Total I/O In: 1309.2 [I.V.:429.2; NG/GT:530; IV Piggyback:350] Out: 1526 [Emesis/NG output:100; Other:1426]  General appearance: opens eyes, moves L hand>R, squeezes L>R Neck: L IJ cath Resp: rales bibasilar and rhonchi bibasilar Cardio: irregularly irregular rhythm, S1, S2 normal and systolic murmur: holosystolic 2/6, blowing at apex GI: mod distension, pos bs,  Extremities: edema 3+  Lab Results:  Recent Labs  08/29/16 1900 08/30/16 0324  WBC 29.2* 21.2*  HGB 9.0* 8.6*  HCT 26.8* 26.0*  PLT 143* 152   BMET:  Recent Labs  08/29/16 1715 08/30/16 0324  NA 134* 136  K 4.2 4.1  CL 104 102  CO2 25 25  GLUCOSE 192* 179*  BUN 20 21*  CREATININE 1.40* 1.49*  CALCIUM 7.6* 7.9*   No results for input(s): PTH in the last 72 hours. Iron Studies: No results for input(s): IRON, TIBC, TRANSFERRIN, FERRITIN in the last 72 hours.  Studies/Results: Ct Head Wo Contrast  Result Date: 08/28/2016 CLINICAL DATA:  Status post intubation. EXAM: CT HEAD WITHOUT CONTRAST TECHNIQUE: Contiguous axial images were obtained from the base of the skull through the vertex without intravenous contrast. COMPARISON:  CT head dated 01/22/2010. FINDINGS: Brain: There is mild generalized parenchymal  atrophy with commensurate dilatation of the ventricles and sulci. Small old lacunar infarct noted within the right basal ganglia region. There is no mass, hemorrhage, edema or other evidence of acute parenchymal abnormality. No extra-axial hemorrhage. Vascular: There are chronic calcified atherosclerotic changes of the large vessels at the skull base. No unexpected hyperdense vessel. Skull: Normal. Negative for fracture or focal lesion. Sinuses/Orbits: No acute finding. Other: None IMPRESSION: No acute findings.  No intracranial mass, hemorrhage or edema. Electronically Signed   By: Franki Cabot M.D.   On: 08/28/2016 19:27   Dg Chest Port 1 View  Result Date: 08/29/2016 CLINICAL DATA:  Hypoxia EXAM: PORTABLE CHEST 1 VIEW COMPARISON:  August 27, 2016 FINDINGS: Endotracheal tube tip is 3.7 cm above the carina. Central catheter tip is in the superior vena cava. Nasogastric and feeding tube tips are below the diaphragm. There is a mediastinal drain and right chest tube. No pneumothorax. There are bilateral pleural effusions with patchy airspace consolidation in the left base and somewhat generalized interstitial edema. Edema and effusions have increased compared to recent study. Consolidation in the left base is stable. Cardiomegaly is stable. The pulmonary vascular is normal. Patient is status post mitral valve replacement. IMPRESSION: Tube and catheter positions as described without pneumothorax. Persistent consolidation left base. New pleural effusions with interstitial edema. Suspect a degree of congestive heart failure. Electronically Signed   By: Lowella Grip III M.D.   On: 08/29/2016 08:28   Dg Abd Portable 1v  Result Date: 08/29/2016 CLINICAL DATA:  Status post feeding tube placement EXAM: PORTABLE ABDOMEN - 1  VIEW COMPARISON:  Supine abdominal radiograph of August 25, 2016 FINDINGS: The tip of the feeding tube projects to the left of midline. This may have read curved upon itself and lie in  the gastric body but may also lie in the fourth portion of the duodenum or at the proximal portion of the jejunum. The bowel gas pattern is unremarkable. IMPRESSION: The feeding tube tip lies in likely in the distal duodenum just proximal to the jejunum but the possibility of it having re- curved upon itself and still lie in the gastric body is raised. A cross-table lateral radiograph may be useful. Electronically Signed   By: David  Martinique M.D.   On: 08/29/2016 12:34    I have reviewed the patient's current medications.  Assessment/Plan: 1 AKI vol improving. CVP rising, ^ net neg,  Much periph xs.  Good solute/acid/base/K.  Phos  Low, replete, nutrition and CRRT 2 MVR/stents per CVS, rhythm 3 Afib 4 DM controlled 5 anemia mild lower, follow 6^ WBC,  On AB P CRRT, ^ net neg, AB, get PC changed in,     LOS: 12 days   Arsen Mangione L 08/30/2016,7:00 AM

## 2016-08-30 NOTE — Progress Notes (Signed)
Dr. Roxy Manns paged about inability to adequately control patient's sedation/agitation level. Patient fighting ventilator and BP rising into the 170s/180s. All ordered PRN's given with little relief. Verbal order for 0.5 mg Ativan IV Q 1 hour recieved. Will continue to monitor patient.

## 2016-08-30 NOTE — Progress Notes (Signed)
Patient ID: Deborah [name], female   DOB: 13-Jul-1956, 60 y.o.   MRN: [id number]   60 y/o woman with DM2, HTN, HL and noncompliance presented to [facility] ER with infrerior posterior STEMI on 9/12. Taken to cath lab and found to have occluded small to moderate OM-1 branch. Underwent successful PCS with stent x 2 by Dr. Clayborn Bigness. Coronaries otherwise ok. EF 55-60% with severe MR confirmed by echo.   Post-cath developed respiratory failure and required intubation. Developed shock and renal failure with peak creatinine 3.4 (1.6 on admit). Trialysis catheter placed.   Patient persistently acidotic with concern for ischemic MR and transferred here. En route develop severe hypotension and norepinephrine started.   Brought to cath lab 9/15 and TEE showed normal LV (EF 60-65%) and normal RV function with ruptured posterior papillary muscle with severe posterior MR.  Underwent placement of Swan and IABP.   S/P MVR 08/23/16.  IABP pulled 9/22.   Echo 9/23  RV and LV are normal (EF 60%) but appeared underfilled   Remains intubated but now more awake. Less agitated.   Off all intoropes except milrinone 0.25. Follows commands but right side appears weak.   BCx + for MRSA. Vanc/zosyn started 9/24. WBC coming down. Still on CVVHD pulling 75/hr.     Scheduled Meds: . albumin human  25 g Intravenous Q8H  . chlorhexidine gluconate (MEDLINE KIT)  15 mL Mouth Rinse BID  . Chlorhexidine Gluconate Cloth  6 each Topical Daily  . clopidogrel  75 mg Oral Daily  . famotidine (PEPCID) IV  20 mg Intravenous Q12H  . feeding supplement (NEPRO CARB STEADY)  1,000 mL Per Tube Q24H  . insulin aspart  0-24 Units Subcutaneous Q4H  . insulin detemir  10 Units Subcutaneous BID  . mouth rinse  15 mL Mouth Rinse 10 times per day  . mupirocin ointment  1 application Nasal BID  . piperacillin-tazobactam  3.375 g Intravenous Q6H  . sodium chloride flush  3 mL Intravenous Q12H  . vancomycin  1,000 mg Intravenous Q24H    Continuous Infusions: . sodium chloride Stopped (08/27/16 0900)  . sodium chloride    . sodium chloride 20 mL/hr at 08/30/16 0557  . amiodarone 30 mg/hr (08/30/16 1300)  . dexmedetomidine 0.6 mcg/kg/hr (08/30/16 1300)  . heparin 10,000 units/ 20 mL infusion syringe 1,300 Units/hr (08/30/16 1719)  . lidocaine Stopped (08/29/16 0900)  . milrinone 0.25 mcg/kg/min (08/30/16 0700)  . dialysis replacement fluid (prismasate) 600 mL/hr at 08/30/16 1619  . dialysis replacement fluid (prismasate) 200 mL/hr at 08/30/16 0417  . dialysate (PRISMASATE) 1,000 mL/hr at 08/30/16 0838   PRN Meds:.sodium chloride, Place/Maintain arterial line **AND** sodium chloride, fentaNYL (SUBLIMAZE) injection, heparin, heparin, HYDROcodone-acetaminophen, magnesium hydroxide, ondansetron (ZOFRAN) IV, ondansetron (ZOFRAN) IV, sodium chloride, sodium chloride flush, sodium chloride flush    Vitals:   08/30/16 1528 08/30/16 1600 08/30/16 1700 08/30/16 1800  BP: (!) 143/75 108/73 (!) 161/92 110/64  Pulse: (!) 117 (!) 109 (!) 117 96  Resp: 18 18 (!) 25 16  Temp: 98.1 F (36.7 C)     TempSrc: Oral     SpO2: 100% 100% 100% 100%  Weight:      Height:        Intake/Output Summary (Last 24 hours) at 08/30/16 1815 Last data filed at 08/30/16 1800  Gross per 24 hour  Intake          3191.61 ml  Output  4544 ml  Net         -1352.39 ml    LABS: Basic Metabolic Panel:  Recent Labs  08/29/16 0431  08/30/16 0324 08/30/16 1546  NA 133*  < > 136 137  K 4.7  < > 4.1 3.7  CL 101  < > 102 101  CO2 23  < > 25 24  GLUCOSE 191*  < > 179* 154*  BUN 21*  < > 21* 20  CREATININE 1.43*  < > 1.49* 1.34*  CALCIUM 7.7*  < > 7.9* 8.1*  MG 2.3  --  2.2  --   PHOS  --   < > 1.9* 2.9  < > = values in this interval not displayed. Liver Function Tests:  Recent Labs  08/29/16 0431  08/30/16 0324 08/30/16 1546  AST 139*  --  55*  --   ALT 291*  --  179*  --   ALKPHOS 184*  --  146*  --   BILITOT 0.9  --   1.1  --   PROT 5.9*  --  6.0*  --   ALBUMIN 1.8*  < > 2.2* 2.8*  < > = values in this interval not displayed. No results for input(s): LIPASE, AMYLASE in the last 72 hours. CBC:  Recent Labs  08/29/16 1900 08/30/16 0324  WBC 29.2* 21.2*  HGB 9.0* 8.6*  HCT 26.8* 26.0*  MCV 87.3 88.4  PLT 143* 152   Cardiac Enzymes: No results for input(s): CKTOTAL, CKMB, CKMBINDEX, TROPONINI in the last 72 hours. BNP: Invalid input(s): POCBNP D-Dimer: No results for input(s): DDIMER in the last 72 hours. Hemoglobin A1C: No results for input(s): HGBA1C in the last 72 hours. Fasting Lipid Panel: No results for input(s): CHOL, HDL, LDLCALC, TRIG, CHOLHDL, LDLDIRECT in the last 72 hours. Thyroid Function Tests: No results for input(s): TSH, T4TOTAL, T3FREE, THYROIDAB in the last 72 hours.  Invalid input(s): FREET3 Anemia Panel: No results for input(s): VITAMINB12, FOLATE, FERRITIN, TIBC, IRON, RETICCTPCT in the last 72 hours.  RADIOLOGY: Dg Chest 1 View  Result Date: 08/17/2016 CLINICAL DATA:  Endotracheal tube placement, OG tube placement EXAM: CHEST 1 VIEW COMPARISON:  Portable chest x-ray of 08/17/2016 FINDINGS: The tip of the endotracheal tube is within the right mainstem bronchus and needs to be withdrawn by approximately 5 cm per OG tube extends below the hemidiaphragm. Bilateral primarily perihilar airspace disease again is noted most consistent with edema or possibly aspiration. A small left pleural effusion is noted. Right central venous line is unchanged. IMPRESSION: 1. Endotracheal tip is within the right mainstem bronchus and needs to be withdrawn by approximately 5 cm. 2. OG tube extends below the hemidiaphragm. 3. Little change in bilateral airspace disease with small left pleural effusion. Electronically Signed   By: Ivar Drape M.D.   On: 08/17/2016 08:05   Dg Chest 1 View  Result Date: 08/17/2016 CLINICAL DATA:  Acute respiratory failure. EXAM: CHEST 1 VIEW COMPARISON:   08/15/2016 FINDINGS: Type of the right central line in the mid SVC. The bilateral perihilar opacities are becoming more confluent, air bronchograms non noted in the right suprahilar region. Stable cardiomegaly. Increasing left lung base atelectasis. Small pleural effusion on prior exam is not well visualized, suspect unchanged. No pneumothorax. IMPRESSION: Coalescing perihilar bilateral opacities, suspicious for increasing pulmonary edema. Increasing left lung base atelectasis. Electronically Signed   By: Jeb Levering M.D.   On: 08/17/2016 03:15   Dg Chest 1 View  Result Date: 08/15/2016 CLINICAL  DATA:  Respiratory failure EXAM: CHEST 1 VIEW COMPARISON:  January 22, 2010 FINDINGS: The mediastinal contour is normal. The heart size is enlarged. The diffuse airspace opacities identified throughout bilateral lungs. There is probably a minimal left pleural effusion. The osseous structures are normal. IMPRESSION: Congestive heart failure. Electronically Signed   By: Abelardo Diesel M.D.   On: 08/15/2016 19:25   Dg Abd 1 View  Result Date: 08/17/2016 CLINICAL DATA:  Endotracheal tube placement, OG tube placement EXAM: ABDOMEN - 1 VIEW COMPARISON:  CT abdomen pelvis of 12/10/2010 FINDINGS: The OG tube tip extends into the body the stomach. The bowel gas pattern is nonspecific. Contrast is noted being excreted by the kidneys and within the urinary bladder secondary to recent CT of the abdomen pelvis. The bowel gas pattern is nonspecific. There may be a small left pleural effusion present. IMPRESSION: OG tube tip extends into the body the stomach. The bowel gas pattern is nonspecific. Electronically Signed   By: Ivar Drape M.D.   On: 08/17/2016 08:09   Ct Head Wo Contrast  Result Date: 08/28/2016 CLINICAL DATA:  Status post intubation. EXAM: CT HEAD WITHOUT CONTRAST TECHNIQUE: Contiguous axial images were obtained from the base of the skull through the vertex without intravenous contrast. COMPARISON:  CT head  dated 01/22/2010. FINDINGS: Brain: There is mild generalized parenchymal atrophy with commensurate dilatation of the ventricles and sulci. Small old lacunar infarct noted within the right basal ganglia region. There is no mass, hemorrhage, edema or other evidence of acute parenchymal abnormality. No extra-axial hemorrhage. Vascular: There are chronic calcified atherosclerotic changes of the large vessels at the skull base. No unexpected hyperdense vessel. Skull: Normal. Negative for fracture or focal lesion. Sinuses/Orbits: No acute finding. Other: None IMPRESSION: No acute findings.  No intracranial mass, hemorrhage or edema. Electronically Signed   By: Franki Cabot M.D.   On: 08/28/2016 19:27   Dg Chest Port 1 View  Result Date: 08/30/2016 CLINICAL DATA:  60 year old female status post mitral valve replacement. Initial encounter. EXAM: PORTABLE CHEST 1 VIEW COMPARISON:  08/29/2016 and earlier. FINDINGS: Portable AP semi upright view at 0628 hours. Stable endotracheal tube. Visible enteric tubes appear stable. Stable dual-lumen left IJ approach central line. Stable right subclavian approach central line. Stable cardiac size and mediastinal contours. Continued veiling opacity at both lung bases an diffuse pulmonary vascular congestion. No pneumothorax. Stable ventilation since yesterday. IMPRESSION: 1.  Stable lines and tubes. 2. Stable ventilation since yesterday with pulmonary interstitial edema superimposed on bilateral pleural effusions and lower lobe collapse or consolidation. Electronically Signed   By: Genevie Ann M.D.   On: 08/30/2016 07:34   Dg Chest Port 1 View  Result Date: 08/29/2016 CLINICAL DATA:  Hypoxia EXAM: PORTABLE CHEST 1 VIEW COMPARISON:  August 27, 2016 FINDINGS: Endotracheal tube tip is 3.7 cm above the carina. Central catheter tip is in the superior vena cava. Nasogastric and feeding tube tips are below the diaphragm. There is a mediastinal drain and right chest tube. No pneumothorax.  There are bilateral pleural effusions with patchy airspace consolidation in the left base and somewhat generalized interstitial edema. Edema and effusions have increased compared to recent study. Consolidation in the left base is stable. Cardiomegaly is stable. The pulmonary vascular is normal. Patient is status post mitral valve replacement. IMPRESSION: Tube and catheter positions as described without pneumothorax. Persistent consolidation left base. New pleural effusions with interstitial edema. Suspect a degree of congestive heart failure. Electronically Signed   By: Lowella Grip  III M.D.   On: 08/29/2016 08:28   Dg Chest Port 1 View  Result Date: 08/27/2016 CLINICAL DATA:  60 year old female with a history of mitral valve repair EXAM: PORTABLE CHEST 1 VIEW COMPARISON:  08/27/2016, 08/26/2016, 08/25/2016 FINDINGS: Cardiomediastinal silhouette unchanged. Surgical changes of prior median sternotomy and mitral valve annuloplasty. Unchanged position of right-sided thoracostomy tube, gastric tube, right subclavian central line. Unchanged position of the endotracheal tube. Unchanged left-sided IJ central venous catheter. Unchanged position of mediastinal drain and epicardial pacing leads. Interval removal of right IJ sheath and Swan-Ganz catheter. Minimal interstitial airspace opacities at the lung bases. Blunting of left costophrenic angle with pleural parenchymal thickening. No pneumothorax. IMPRESSION: Interval removal of right IJ sheath and Swan-Ganz catheter. Mild airspace disease at the bases with likely trace left pleural effusion. Unchanged endotracheal tube, gastric tube, right subclavian central line, left IJ catheter, mediastinal/pleural drains, and epicardial pacing leads. Surgical changes of median sternotomy and mitral valve annuloplasty. Signed, Dulcy Fanny. Earleen Newport, DO [facility] Electronically Signed   By: Corrie Mckusick D.O.   On:  08/27/2016 17:12   Dg Chest Port 1 View  Result Date: 08/26/2016 CLINICAL DATA:  Status post cardiac surgery and valve replacement. EXAM: PORTABLE CHEST 1 VIEW COMPARISON:  08/25/2016 FINDINGS: Changes from the recent cardiac surgery and mitral valve replacement are stable from most recent prior exam. Cardiac silhouette is mildly enlarged. There is no mediastinal widening. Mild lower lung zone hazy opacity is noted consistent with a combination of small pleural effusions and atelectasis. Mild interstitial thickening is noted centrally which has improved. There was no overt pulmonary edema. No pneumothorax. Endotracheal tube, right internal jugular Swan-Ganz catheter, left internal jugular dual-lumen central venous catheter, nasal/orogastric tube, right chest tube and mediastinal tube are stable in well positioned. Right subclavian central venous line catheter tip projects in the right atrium, also stable. IMPRESSION: 1. Improved lung aeration when compared the prior exam consistent with improved pulmonary edema. 2. Lung base opacity noted consistent with small effusions and atelectasis. 3. No pneumothorax.  No mediastinal widening. 4. Support apparatus is stable. Electronically Signed   By: Lajean Manes M.D.   On: 08/26/2016 08:03   Dg Chest Port 1 View  Result Date: 08/25/2016 CLINICAL DATA:  Status post mitral valve replacement EXAM: PORTABLE CHEST 1 VIEW COMPARISON:  08/24/2016 FINDINGS: Cardiac shadow is stable. Intra-aortic balloon pump is again noted just below the aortic knob. Changes of mitral valve replacement are again seen. Swan-Ganz catheter is noted in the pulmonary outflow tract. Temporary dialysis catheter is noted via the left jugular approach. Right subclavian central line is noted as well. Endotracheal tube and mediastinal drain are again noted. The degree of vascular congestion has improved somewhat in the interval from the prior exam although persists. Bilateral pleural effusions right  greater than left are noted. No focal confluent infiltrate is seen. IMPRESSION: Persistent but improved vascular congestion with bilateral effusions. Tubes and lines as described. Electronically Signed   By: Inez Catalina M.D.   On: 08/25/2016 08:40   Dg Chest Port 1 View  Result Date: 08/24/2016 CLINICAL DATA:  Mitral valve replacement, chest tube EXAM: PORTABLE CHEST 1 VIEW COMPARISON:  08/23/2016 FINDINGS: PA support devices including right chest tube, endotracheal tube Swan-Ganz catheter are unchanged. No pneumothorax. Diffuse interstitial prominence throughout the lungs is increased, likely mild edema. Mild cardiomegaly. Left base atelectasis crash that left base atelectasis has increased. IMPRESSION: Increasing interstitial prominence throughout the lungs, likely worsening interstitial edema.  Increasing left base atelectasis. Electronically Signed   By: Rolm Baptise M.D.   On: 08/24/2016 08:41   Dg Chest Portable 1 View  Result Date: 08/23/2016 CLINICAL DATA:  Hypoxia EXAM: PORTABLE CHEST 1 VIEW COMPARISON:  Study obtained earlier in the day FINDINGS: Endotracheal tube tip is 2.6 cm above the carina. Central catheter placed from the right side has its tip in the right atrium slightly beyond the cavoatrial junction. Swan-Ganz catheter tip is in the proximal right main pulmonary artery. Left jugular catheter tip is in the superior vena cava. Nasogastric tube is no longer apparent. Temporary pacemaker wires are attached to the right heart. No pneumothorax. There is perihilar and central interstitial edema. No airspace consolidation. Heart is mildly enlarged. There is mild pulmonary venous hypertension. Prosthetic cardiac valve present. No adenopathy evident. IMPRESSION: Tube and catheter positions as described without pneumothorax. Note that the new catheter placed on the right side has its tip in the right atrium slightly beyond the cavoatrial junction. There is interstitial edema with mild cardiomegaly  and mild pulmonary venous hypertension. No airspace consolidation appreciable. Electronically Signed   By: Lowella Grip III M.D.   On: 08/23/2016 15:34   Dg Chest Port 1 View  Result Date: 08/23/2016 CLINICAL DATA:  Respiratory failure. Acute on chronic diastolic heart failure. Cardiogenic shock. EXAM: PORTABLE CHEST 1 VIEW COMPARISON:  08/22/2016, 08/21/2016 and 08/20/2016 and 08/15/2016 FINDINGS: Endotracheal tube, NG tube and right and left jugular vein central catheters appear in good position, unchanged. Intra aortic balloon pump in place. Heart size and pulmonary vascularity are now normal. Small bilateral pleural effusions. Slight haziness at the lung bases is most likely secondary to the effusions although I think there is an element of slight residual pulmonary edema as well. IMPRESSION: Improving pulmonary edema.  Persistent small bilateral effusions. Electronically Signed   By: Lorriane Shire M.D.   On: 08/23/2016 07:44   Dg Chest Port 1 View  Result Date: 08/22/2016 CLINICAL DATA:  Heart failure.  Balloon pump advanced. EXAM: PORTABLE CHEST 1 VIEW COMPARISON:  08/22/2016 at 4:50 a.m. FINDINGS: Metallic tip of the balloon pump projects over the aortic knob, well positioned. Endotracheal tube, right and left internal jugular central venous lines, femoral a placed Swan-Ganz catheter and nasal/orogastric tube are stable in well positioned. Lung aeration appears mildly improved although the difference may be technical only. There still vascular congestion and interstitial and hazy airspace opacity consistent with asymmetric pulmonary edema. Small pleural effusions are noted. IMPRESSION: 1. Intra-aortic balloon pump tip projects over the aortic knob, well positioned. 2. Possible mild improvement in lung aeration since the earlier study although the difference may be technical only. No other evidence of a change. Electronically Signed   By: Lajean Manes M.D.   On: 08/22/2016 16:34   Dg Chest  Port 1 View  Result Date: 08/22/2016 CLINICAL DATA:  Respiratory failure, shortness of Breath EXAM: PORTABLE CHEST 1 VIEW COMPARISON:  08/21/2016 FINDINGS: Support devices are stable. Cardiomegaly. Bilateral perihilar and lower lobe opacities throughout the lungs have increased slightly since prior study, likely mild edema. Suspect small layering effusions. IMPRESSION: Mild interval worsening of pulmonary edema/ CHF. Small layering effusions. Electronically Signed   By: Rolm Baptise M.D.   On: 08/22/2016 07:30   Dg Chest Port 1 View  Result Date: 08/21/2016 CLINICAL DATA:  Acute hypoxemic respiratory failure EXAM: PORTABLE CHEST 1 VIEW COMPARISON:  08/20/2016 FINDINGS: Support devices are stable. Endotracheal tube remains at the level of the carina. Borderline heart  size. Bilateral airspace opacities are again noted, likely edema. Findings stable or slightly worsened since prior study. Possible small right effusion. IMPRESSION: Slight interval worsening in pulmonary edema pattern. Small right effusion. Endotracheal tube remains at the level of the carina and could be retracted 2-3 cm for optimal positioning. Electronically Signed   By: Rolm Baptise M.D.   On: 08/21/2016 07:40   Dg Chest Port 1 View  Result Date: 08/20/2016 CLINICAL DATA:  Pt was hypotensive with respiratory failure x a few days ago. Hx of DM, and HTN. Pt had a cardiac cath on 08-15-16. EXAM: PORTABLE CHEST 1 VIEW COMPARISON:  08/19/2016 FINDINGS: Endotracheal to appears low at the level of the carina. LEFT and RIGHT central venous line are unchanged. Swan-Ganz catheter from an inferior approach unchanged. There is some improvement aeration lung bases. Mild pulmonary edema remains. No pneumothorax. IMPRESSION: 1. Endotracheal tube appears low at the level the carina. 2. Stable support apparatus. 3. Slight improved aeration lung bases with persistent mild edema. Electronically Signed   By: Suzy Bouchard M.D.   On: 08/20/2016 07:58   Dg  Chest Port 1 View  Result Date: 08/19/2016 CLINICAL DATA:  Pulmonary edema EXAM: PORTABLE CHEST 1 VIEW COMPARISON:  08/18/2016 FINDINGS: External pad artifact. Endotracheal tube, central venous lines, and NG tube unchanged. Swan-Ganz catheter and intra aortic balloon pump unchanged. Stable cardiac silhouette. Mild central venous pulmonary congestion. No overt pulmonary edema. No pneumothorax. IMPRESSION: 1. Stable support apparatus. 2. Central venous congestion.  No significant change. Electronically Signed   By: Suzy Bouchard M.D.   On: 08/19/2016 07:55   Dg Chest Port 1 View  Result Date: 08/18/2016 CLINICAL DATA:  Placement of femoral Swan-Ganz catheter and intra-aortic balloon pump catheter. EXAM: PORTABLE CHEST 1 VIEW 4:14 p.m.: COMPARISON:  Portable chest x-ray earlier today 3:37 p.m. and previously. FINDINGS: Femoral Swan-Ganz catheter tip projects at the expected location of the right middle lobe pulmonary artery. Intra-aortic balloon pump catheter tip projects over the proximal descending thoracic aorta. Endotracheal tube tip now projects approximately 2-3 cm above the carina. Right jugular central venous catheter tip projects over the upper SVC. Left jugular central venous catheter tip projects over the lower SVC. Nasogastric tube courses below the diaphragm into the stomach. Improved aeration in the left lower lobe since the most recent examination 45 minutes earlier, though streaky opacities persist. Improved perihilar airspace pulmonary edema in both lungs. No new pulmonary parenchymal abnormalities. IMPRESSION: 1. Swan-Ganz catheter tip projects at the expected location of the right middle lobe pulmonary artery. 2. Intra-aortic balloon pump catheter tip projects over the proximal descending thoracic aorta. 3. Remaining support apparatus satisfactory. 4. Improved aeration in both lungs since the examination performed 45 minutes ago, although streaky left lower lobe atelectasis and/or pneumonia  and mild perihilar airspace pulmonary edema persists. 5. No new abnormalities. Electronically Signed   By: Evangeline Dakin M.D.   On: 08/18/2016 16:43   Dg Chest Port 1 View  Result Date: 08/18/2016 CLINICAL DATA:  ST-elevation myocardial infarction and cardiogenic shock. EXAM: PORTABLE CHEST 1 VIEW COMPARISON:  Yesterday FINDINGS: Endotracheal tube tip 1 cm above the carina. Central line on the right and dialysis catheter on the left with tips at the lower SVC. Orogastric tube reaches the stomach at least. Stable borderline heart size. Worsening aeration at the left base with newly obscured diaphragm. Perihilar airspace disease. IMPRESSION: 1. Lower endotracheal tube with tip 1 cm above the carina. 2. New atelectasis or consolidation in the left lower  lobe with small effusion. 3. Pulmonary edema. Electronically Signed   By: Monte Fantasia M.D.   On: 08/18/2016 07:02   Dg Chest Port 1 View  Result Date: 08/17/2016 CLINICAL DATA:  Central line placement EXAM: PORTABLE CHEST 1 VIEW COMPARISON:  Earlier same day FINDINGS: Endotracheal tube is been withdrawn with the tip 2.5 cm proximal to the carina. Right internal jugular central line remains in place with its tip in the SVC above the right atrium. New dual-lumen left internal jugular central line has its tip in the SVC above the right atrium. No pneumothorax. Bilateral bat wing airspace filling is seen consistent with acute edema or acute airspace filling due to other etiologies. No effusions. No collapse. IMPRESSION: Left IJ central line well position with tip in the SVC above the right atrium. No pneumothorax. Endotracheal tube repositioned, well positioned with tip 2.5 cm above the carina. Persistent worsen bat wing edema or airspace filling pattern. Electronically Signed   By: Nelson Chimes M.D.   On: 08/17/2016 10:35   Dg Chest Port 1 View  Result Date: 08/15/2016 CLINICAL DATA:  Central line placement EXAM: PORTABLE CHEST 1 VIEW COMPARISON:   August 15, 2016 7:04 p.m. FINDINGS: Right central venous line is identified with distal tip in superior vena cava. There is no pneumothorax. The heart size mildly enlarged. Diffuse airspace opacities identified throughout both lungs. The bones are unchanged. IMPRESSION: Right central venous line distal tip in the superior vena cava. There is no pneumothorax. Pulmonary edema unchanged compared prior exam. Electronically Signed   By: Abelardo Diesel M.D.   On: 08/15/2016 21:10   Dg Chest Port 1v Same Day  Result Date: 08/27/2016 CLINICAL DATA:  Post MITRAL VALVE REPAIR (MVR), INTRAOPERATIVE TRANSESOPHAGEAL ECHOCARDIOGRAM, ENDOVEIN HARVEST OF GREATER SAPHENOUS VEIN, CENTRAL LINE INSERTION on 08/23/2016. H/O diabetes, hypertension. Pt is intubated and on 24/7 dialysis. Non smoker EXAM: PORTABLE CHEST 1 VIEW COMPARISON:  08/26/2016 FINDINGS: right IJ Swan-Ganz catheter unchanged. There is also a left-sided internal jugular dialysis catheter which terminates at the mid SVC. Endotracheal tube terminates 3.3 cm above carina.Nasogastric and feeding tubes extend beyond the inferior aspect of the film. Mediastinal drain and right-sided chest tube. Cardiomegaly accentuated by AP portable technique. No pleural effusion or pneumothorax. Persistent mild to moderate interstitial edema. Bibasilar airspace disease. Right-sided subclavian line is also unchanged. IMPRESSION: No significant change since the prior exam. Cardiomegaly with mild to moderate interstitial edema and bibasilar airspace disease. Stable support apparatus, without pneumothorax. Electronically Signed   By: [name] Miyamoto M.D.   On: 08/27/2016 09:06   Dg Abd Portable 1v  Result Date: 08/29/2016 CLINICAL DATA:  Status post feeding tube placement EXAM: PORTABLE ABDOMEN - 1 VIEW COMPARISON:  Supine abdominal radiograph of August 25, 2016 FINDINGS: The tip of the feeding tube projects to the left of midline. This may have read curved upon itself and lie in the  gastric body but may also lie in the fourth portion of the duodenum or at the proximal portion of the jejunum. The bowel gas pattern is unremarkable. IMPRESSION: The feeding tube tip lies in likely in the distal duodenum just proximal to the jejunum but the possibility of it having re- curved upon itself and still lie in the gastric body is raised. A cross-table lateral radiograph may be useful. Electronically Signed   By: David  Martinique M.D.   On: 08/29/2016 12:34   Dg Abd Portable 1v  Result Date: 08/25/2016 CLINICAL DATA:  Feeding tube placement. EXAM: PORTABLE ABDOMEN -  1 VIEW COMPARISON:  08/22/2016. FINDINGS: Feeding tube noted with its tip projected over the distal stomach/ proximal duodenum. NG tube noted in stable position with tip over the stomach. Left femoral catheter noted. Right femoral sheath in stable position . Aortic balloon pump tip noted L3. No bowel distention. IMPRESSION: Interim placement of feeding tube, its tip is projected over the distal stomach/ duodenum. Remaining lines and tubes including aortic balloon pump in stable position . Electronically Signed   By: Marcello Moores  Register   On: 08/25/2016 12:50   Dg Abd Portable 1v  Result Date: 08/22/2016 CLINICAL DATA:  Balloon pump position EXAM: PORTABLE ABDOMEN - 1 VIEW COMPARISON:  Chest x-ray 08/22/2016.  KUB 08/17/2016 FINDINGS: Right femoral Swan-Ganz catheter extends above the diaphragm . Intra-aortic balloon pump marker appears overlying the L3 vertebral body in previously was in the thoracic aorta. Left femoral venous catheter in the left iliac vein. NG tube in the stomach. Normal bowel gas pattern. IMPRESSION: Intra-aortic balloon pump marker  overlies the L3 vertebral body. Electronically Signed   By: Franchot Gallo M.D.   On: 08/22/2016 13:12   Dg Abd Portable 1v  Result Date: 08/19/2016 CLINICAL DATA:  Acidosis EXAM: PORTABLE ABDOMEN - 1 VIEW COMPARISON:  08/17/2016 FINDINGS: No dilated loops large or small bowel. NG tube in  stomach. Swan-Ganz catheter noted LEFT basilar atelectasis. The kidneys are dense suggesting retained IV contrast . IMPRESSION: 1. Normal bowel-gas pattern. 2. Retained contrast in the kidneys consistent consistent with acute kidney injury. Electronically Signed   By: Suzy Bouchard M.D.   On: 08/19/2016 08:02   US Abdomen Limited Ruq  Result Date: 08/26/2016 CLINICAL DATA:  Acute onset of abnormal LFTs.  Initial encounter. EXAM: US ABDOMEN LIMITED - RIGHT UPPER QUADRANT COMPARISON:  None. FINDINGS: Gallbladder: Gallbladder wall thickening is noted, measuring up to 5 mm. Vague internal echoes are seen within the gallbladder, suggestive of underlying sludge. No definite stones are identified. No pericholecystic fluid is seen. Evaluation for ultrasonographic Murphy's sign is not possible, as the patient is sedated and on ventilation. Common bile duct: Diameter: 0.5 cm, within normal limits in caliber. Liver: No focal lesion identified. Within normal limits in parenchymal echogenicity. The left hepatic lobe is not assessed as the patient has overlying bandages, due to recent CABG and mitral valve replacement. A small right-sided pleural effusion is noted. IMPRESSION: 1. Gallbladder wall thickening noted. Suggestion of underlying sludge filling the gallbladder. No definite evidence of cholelithiasis. Findings may reflect mild chronic inflammation. No pericholecystic fluid is seen to suggest acute cholecystitis. 2. Small right-sided pleural effusion noted. 3. Liver grossly unremarkable in appearance, though the left hepatic lobe is not imaged on this study. Electronically Signed   By: Garald Balding M.D.   On: 08/26/2016 18:29    PHYSICAL EXAM General: Intubated/sedated Neck: RIJ TLC LIJ trialysis  Lungs: Clear anteriorly CV: Nondisplaced PMI.  Heart regular S1/S2, no S3/S4, 3/6 HSM apex.  Trace  peripheral edema.   Abdomen: Soft, no hepatosplenomegaly, no distention.  Neurologic: Sedated.  Not responding  to pain  Extremities: No clubbing or cyanosis. 2+ edema GU: Foley    TELEMETRY: Sinus tach  Assessment:   1. Cardiogenic shock: Due to acute severe MR.  2. CAD: Inferior posterior STEMI on 9/13 with DES x 2 to OM-1      --Plavix restarted on 9/22 3. 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     --s/p MVR 4. AKI on  CKD stage III - due to ATN likely from cardiogenic shock + contrast with cath. Has required CVVH.  5. Acute respiratory failure: Pulmonary edema.  6. DM2 7. Suspect component of septic shock: Possible PNA, group B Strep in sputum culture.  8. Anemia 9. PVCs   10. Shock liver 11. MRSA bactermia   Plan/Discussion:    She is critically ill with cardiogenic shock and multi-system organ failure in setting of inferior STEMI, severe ischemic MR due to ruptured papillary muscle and AKI. S/P MVR 08/23/16  Now off norepi and epi. Remains on milrinone. Will start wean tomorrow     Mental status improving. Appears to have R-sided weakness. Head CT negative. Discussed with CCM. Possible vent wean in next 24-48 hours.   Ectopy stabilized. Off lido.    Back on Plavix with recent stent.   Bcx with MRSA. Now on day #3  Vanc/zosyn. Likely can narrow to vanc alone.    The patient is critically ill with multiple organ systems failure and requires high complexity decision making for assessment and support, frequent evaluation and titration of therapies, application of advanced monitoring technologies and extensive interpretation of multiple databases.   Critical Care Time devoted to patient care services described in this note is 35 Minutes.  Deaysia Grigoryan,MD 6:15 PM

## 2016-08-30 NOTE — Progress Notes (Signed)
Patient ID: Deborah [name], female   DOB: 06/08/1956, 60 y.o.   MRN: [id number] More hemodynamically stable. Off inotropes. Arousable on vent. Plan tunneled catheter tomorrow in the operating room.

## 2016-08-30 NOTE — Progress Notes (Signed)
Paged Cardiology fellow on-call about patient's HR maintaining upper 130s-140s. No new orders received. Will continue to monitor.

## 2016-08-31 ENCOUNTER — Inpatient Hospital Stay (HOSPITAL_COMMUNITY): Payer: Medicaid Other

## 2016-08-31 ENCOUNTER — Encounter (HOSPITAL_COMMUNITY)
Admission: EM | Disposition: A | Discharge status: Rehabilitation Facility | Payer: Self-pay | Source: Other Acute Inpatient Hospital | Attending: Cardiothoracic Surgery

## 2016-08-31 ENCOUNTER — Inpatient Hospital Stay (HOSPITAL_COMMUNITY): Payer: Medicaid Other | Admitting: Certified Registered Nurse Anesthetist

## 2016-08-31 ENCOUNTER — Encounter (HOSPITAL_COMMUNITY): Payer: Self-pay | Admitting: Certified Registered Nurse Anesthetist

## 2016-08-31 HISTORY — PX: INSERTION OF DIALYSIS CATHETER: SHX1324

## 2016-08-31 LAB — CARBOXYHEMOGLOBIN
CARBOXYHEMOGLOBIN: 0.7 % (ref 0.5–1.5)
Carboxyhemoglobin: 0.8 % (ref 0.5–1.5)
METHEMOGLOBIN: 1.1 % (ref 0.0–1.5)
Methemoglobin: 1 % (ref 0.0–1.5)
O2 SAT: 40.9 %
O2 Saturation: 54.4 %
Total hemoglobin: 9 g/dL — ABNORMAL LOW (ref 12.0–16.0)
Total hemoglobin: 8.1 g/dL — ABNORMAL LOW (ref 12.0–16.0)

## 2016-08-31 LAB — POCT I-STAT 4, (NA,K, GLUC, HGB,HCT)
Glucose, Bld: 168 mg/dL — ABNORMAL HIGH (ref 65–99)
HCT: 27 % — ABNORMAL LOW (ref 36.0–46.0)
HEMOGLOBIN: 9.2 g/dL — AB (ref 12.0–15.0)
POTASSIUM: 4 mmol/L (ref 3.5–5.1)
Sodium: 139 mmol/L (ref 135–145)

## 2016-08-31 LAB — RENAL FUNCTION PANEL
ALBUMIN: 3.1 g/dL — AB (ref 3.5–5.0)
ALBUMIN: 3 g/dL — AB (ref 3.5–5.0)
ANION GAP: 8 (ref 5–15)
Anion gap: 12 (ref 5–15)
BUN: 19 mg/dL (ref 6–20)
BUN: 22 mg/dL — AB (ref 6–20)
CALCIUM: 8.2 mg/dL — AB (ref 8.9–10.3)
CALCIUM: 7.8 mg/dL — AB (ref 8.9–10.3)
CO2: 25 mmol/L (ref 22–32)
CO2: 20 mmol/L — AB (ref 22–32)
CREATININE: 1.7 mg/dL — AB (ref 0.44–1.00)
Chloride: 102 mmol/L (ref 101–111)
Chloride: 103 mmol/L (ref 101–111)
Creatinine, Ser: 1.34 mg/dL — ABNORMAL HIGH (ref 0.44–1.00)
GFR calc Af Amer: 49 mL/min — ABNORMAL LOW (ref >60)
GFR calc Af Amer: 37 mL/min — ABNORMAL LOW (ref >60)
GFR calc non Af Amer: 32 mL/min — ABNORMAL LOW (ref >60)
GFR, EST NON AFRICAN AMERICAN: 42 mL/min — AB (ref >60)
GLUCOSE: 169 mg/dL — AB (ref 65–99)
GLUCOSE: 171 mg/dL — AB (ref 65–99)
PHOSPHORUS: 2.1 mg/dL — AB (ref 2.5–4.6)
POTASSIUM: 4 mmol/L (ref 3.5–5.1)
Phosphorus: 5.1 mg/dL — ABNORMAL HIGH (ref 2.5–4.6)
Potassium: 4.1 mmol/L (ref 3.5–5.1)
SODIUM: 135 mmol/L (ref 135–145)
SODIUM: 135 mmol/L (ref 135–145)

## 2016-08-31 LAB — CBC
HCT: 26.5 % — ABNORMAL LOW (ref 36.0–46.0)
HEMOGLOBIN: 8.6 g/dL — AB (ref 12.0–15.0)
MCH: 29 pg (ref 26.0–34.0)
MCHC: 32.5 g/dL (ref 30.0–36.0)
MCV: 89.2 fL (ref 78.0–100.0)
Platelets: 157 10*3/uL (ref 150–400)
RBC: 2.97 MIL/uL — AB (ref 3.87–5.11)
RDW: 16 % — ABNORMAL HIGH (ref 11.5–15.5)
WBC: 20.5 10*3/uL — ABNORMAL HIGH (ref 4.0–10.5)

## 2016-08-31 LAB — POCT I-STAT 3, ART BLOOD GAS (G3+)
ACID-BASE EXCESS: 2 mmol/L (ref 0.0–2.0)
Bicarbonate: 25.6 mmol/L (ref 20.0–28.0)
O2 Saturation: 98 %
PH ART: 7.453 — AB (ref 7.350–7.450)
TCO2: 27 mmol/L (ref 0–100)
pCO2 arterial: 36.4 mmHg (ref 32.0–48.0)
pO2, Arterial: 100 mmHg (ref 83.0–108.0)

## 2016-08-31 LAB — POCT ACTIVATED CLOTTING TIME
ACTIVATED CLOTTING TIME: 197 s
ACTIVATED CLOTTING TIME: 197 s
ACTIVATED CLOTTING TIME: 202 s
ACTIVATED CLOTTING TIME: 191 s
ACTIVATED CLOTTING TIME: 191 s
ACTIVATED CLOTTING TIME: 169 s
ACTIVATED CLOTTING TIME: 191 s
ACTIVATED CLOTTING TIME: 202 s
Activated Clotting Time: 186 seconds
Activated Clotting Time: 191 seconds
Activated Clotting Time: 186 seconds
Activated Clotting Time: 169 seconds
Activated Clotting Time: 180 seconds
Activated Clotting Time: 186 seconds
Activated Clotting Time: 191 seconds
Activated Clotting Time: 191 seconds
Activated Clotting Time: 186 seconds

## 2016-08-31 LAB — GLUCOSE, CAPILLARY
GLUCOSE-CAPILLARY: 171 mg/dL — AB (ref 65–99)
GLUCOSE-CAPILLARY: 186 mg/dL — AB (ref 65–99)
GLUCOSE-CAPILLARY: 157 mg/dL — AB (ref 65–99)
GLUCOSE-CAPILLARY: 197 mg/dL — AB (ref 65–99)
Glucose-Capillary: 140 mg/dL — ABNORMAL HIGH (ref 65–99)

## 2016-08-31 LAB — COMPREHENSIVE METABOLIC PANEL
ALK PHOS: 142 U/L — AB (ref 38–126)
ALT: 123 U/L — AB (ref 14–54)
ANION GAP: 7 (ref 5–15)
AST: 37 U/L (ref 15–41)
Albumin: 3.1 g/dL — ABNORMAL LOW (ref 3.5–5.0)
BUN: 19 mg/dL (ref 6–20)
CALCIUM: 8.2 mg/dL — AB (ref 8.9–10.3)
CO2: 25 mmol/L (ref 22–32)
CREATININE: 1.3 mg/dL — AB (ref 0.44–1.00)
Chloride: 105 mmol/L (ref 101–111)
GFR, EST AFRICAN AMERICAN: 51 mL/min — AB (ref >60)
GFR, EST NON AFRICAN AMERICAN: 44 mL/min — AB (ref >60)
Glucose, Bld: 169 mg/dL — ABNORMAL HIGH (ref 65–99)
Potassium: 4.1 mmol/L (ref 3.5–5.1)
Sodium: 137 mmol/L (ref 135–145)
TOTAL PROTEIN: 6.5 g/dL (ref 6.5–8.1)
Total Bilirubin: 1.1 mg/dL (ref 0.3–1.2)

## 2016-08-31 LAB — VANCOMYCIN, TROUGH: Vancomycin Tr: 21 ug/mL (ref 15–20)

## 2016-08-31 LAB — MAGNESIUM: MAGNESIUM: 2.5 mg/dL — AB (ref 1.7–2.4)

## 2016-08-31 SURGERY — INSERTION OF DIALYSIS CATHETER
Anesthesia: Monitor Anesthesia Care | Site: Neck | Laterality: Bilateral

## 2016-08-31 MED ORDER — CISATRACURIUM BESYLATE (PF) 10 MG/5ML IV SOLN
INTRAVENOUS | Status: DC | PRN
  Administered 2016-08-31: 4 mg via INTRAVENOUS

## 2016-08-31 MED ORDER — LIDOCAINE-EPINEPHRINE (PF) 1 %-1:200000 IJ SOLN
INTRAMUSCULAR | Status: AC
  Filled 2016-08-31: qty 30

## 2016-08-31 MED ORDER — DEXTROSE 5 % IV SOLN
INTRAVENOUS | Status: DC | PRN
  Administered 2016-08-31: 40 ug/min via INTRAVENOUS

## 2016-08-31 MED ORDER — FENTANYL CITRATE (PF) 100 MCG/2ML IJ SOLN
25.0000 ug | INTRAMUSCULAR | Status: DC | PRN
  Filled 2016-08-31 (×2): qty 2

## 2016-08-31 MED ORDER — PRO-STAT SUGAR FREE PO LIQD
30.0000 mL | Freq: Every day | ORAL | Status: DC
  Administered 2016-08-31 – 2016-09-01 (×5): 30 mL
  Filled 2016-08-31 (×5): qty 30

## 2016-08-31 MED ORDER — FENTANYL CITRATE (PF) 100 MCG/2ML IJ SOLN
50.0000 ug | INTRAMUSCULAR | Status: AC | PRN
  Administered 2016-08-31 (×2): 50 ug via INTRAVENOUS
  Filled 2016-08-31: qty 2

## 2016-08-31 MED ORDER — HEPARIN SODIUM (PORCINE) 1000 UNIT/ML IJ SOLN
INTRAMUSCULAR | Status: DC | PRN
  Administered 2016-08-31: 1000 [IU]

## 2016-08-31 MED ORDER — DEXMEDETOMIDINE HCL 200 MCG/2ML IV SOLN
INTRAVENOUS | Status: DC | PRN
  Administered 2016-08-31: 8 ug via INTRAVENOUS

## 2016-08-31 MED ORDER — IOPAMIDOL (ISOVUE-300) INJECTION 61%
INTRAVENOUS | Status: AC
  Filled 2016-08-31: qty 50

## 2016-08-31 MED ORDER — FENTANYL CITRATE (PF) 100 MCG/2ML IJ SOLN
INTRAMUSCULAR | Status: AC
  Filled 2016-08-31: qty 2

## 2016-08-31 MED ORDER — MIDAZOLAM HCL 2 MG/2ML IJ SOLN
INTRAMUSCULAR | Status: DC | PRN
  Administered 2016-08-31: 2 mg via INTRAVENOUS

## 2016-08-31 MED ORDER — LIDOCAINE HCL (PF) 1 % IJ SOLN
INTRAMUSCULAR | Status: AC
  Filled 2016-08-31: qty 30

## 2016-08-31 MED ORDER — MIDAZOLAM HCL 2 MG/2ML IJ SOLN
INTRAMUSCULAR | Status: AC
  Filled 2016-08-31: qty 2

## 2016-08-31 MED ORDER — AMIODARONE HCL IN DEXTROSE 360-4.14 MG/200ML-% IV SOLN
INTRAVENOUS | Status: DC | PRN
  Administered 2016-08-31: 30 mg/h via INTRAVENOUS

## 2016-08-31 MED ORDER — LIDOCAINE-EPINEPHRINE (PF) 1 %-1:200000 IJ SOLN
INTRAMUSCULAR | Status: DC | PRN
  Administered 2016-08-31: 30 mL

## 2016-08-31 MED ORDER — SODIUM PHOSPHATES 45 MMOLE/15ML IV SOLN
30.0000 mmol | Freq: Once | INTRAVENOUS | Status: AC
  Administered 2016-08-31: 30 mmol via INTRAVENOUS
  Filled 2016-08-31: qty 10

## 2016-08-31 MED ORDER — SODIUM CHLORIDE 0.9 % IV SOLN
INTRAVENOUS | Status: DC | PRN
  Administered 2016-08-31: 15:00:00

## 2016-08-31 MED ORDER — CISATRACURIUM BESYLATE 20 MG/10ML IV SOLN
INTRAVENOUS | Status: AC
  Filled 2016-08-31: qty 10

## 2016-08-31 MED ORDER — HEPARIN SODIUM (PORCINE) 1000 UNIT/ML IJ SOLN
INTRAMUSCULAR | Status: AC
  Filled 2016-08-31: qty 1

## 2016-08-31 MED ORDER — HEPARIN SODIUM (PORCINE) 1000 UNIT/ML IJ SOLN
1000.0000 [IU] | Freq: Once | INTRAMUSCULAR | Status: AC
  Administered 2016-08-31: 2600 [IU] via INTRAVENOUS
  Filled 2016-08-31: qty 3
  Filled 2016-08-31: qty 6

## 2016-08-31 MED ORDER — DEXMEDETOMIDINE HCL IN NACL 400 MCG/100ML IV SOLN
INTRAVENOUS | Status: DC | PRN
  Administered 2016-08-31: 0.7 ug/kg/h via INTRAVENOUS

## 2016-08-31 MED ORDER — PHENYLEPHRINE HCL 10 MG/ML IJ SOLN
INTRAMUSCULAR | Status: DC | PRN
  Administered 2016-08-31: 120 ug via INTRAVENOUS
  Administered 2016-08-31: 80 ug via INTRAVENOUS
  Administered 2016-08-31: 120 ug via INTRAVENOUS
  Administered 2016-08-31: 80 ug via INTRAVENOUS

## 2016-08-31 MED ORDER — VANCOMYCIN HCL IN DEXTROSE 750-5 MG/150ML-% IV SOLN
750.0000 mg | INTRAVENOUS | Status: DC
  Administered 2016-09-01: 750 mg via INTRAVENOUS
  Filled 2016-08-31: qty 150

## 2016-08-31 MED ORDER — MILRINONE LACTATE IN DEXTROSE 20-5 MG/100ML-% IV SOLN
INTRAVENOUS | Status: DC | PRN
  Administered 2016-08-31: .2 ug/kg/min via INTRAVENOUS

## 2016-08-31 MED ORDER — FENTANYL CITRATE (PF) 100 MCG/2ML IJ SOLN
INTRAMUSCULAR | Status: DC | PRN
  Administered 2016-08-31 (×2): 50 ug via INTRAVENOUS

## 2016-08-31 MED ORDER — 0.9 % SODIUM CHLORIDE (POUR BTL) OPTIME
TOPICAL | Status: DC | PRN
  Administered 2016-08-31: 1000 mL

## 2016-08-31 MED ORDER — NEPRO/CARBSTEADY PO LIQD
1000.0000 mL | ORAL | Status: DC
  Administered 2016-08-31: 1000 mL
  Filled 2016-08-31 (×2): qty 1000

## 2016-08-31 SURGICAL SUPPLY — 43 items
ARROW ×3 IMPLANT
BAG DECANTER FOR FLEXI CONT (MISCELLANEOUS) ×3 IMPLANT
BIOPATCH RED 1 DISK 7.0 (GAUZE/BANDAGES/DRESSINGS) ×2 IMPLANT
BIOPATCH RED 1IN DISK 7.0MM (GAUZE/BANDAGES/DRESSINGS) ×1
CATH PALINDROME RT-P 15FX19CM (CATHETERS) IMPLANT
CATH PALINDROME RT-P 15FX23CM (CATHETERS) IMPLANT
CATH PALINDROME RT-P 15FX28CM (CATHETERS) ×3 IMPLANT
CATH PALINDROME RT-P 15FX55CM (CATHETERS) IMPLANT
CHLORAPREP W/TINT 26ML (MISCELLANEOUS) ×3 IMPLANT
COVER PROBE W GEL 5X96 (DRAPES) ×3 IMPLANT
COVER SURGICAL LIGHT HANDLE (MISCELLANEOUS) ×3 IMPLANT
DERMABOND ADVANCED (GAUZE/BANDAGES/DRESSINGS) ×2
DERMABOND ADVANCED .7 DNX12 (GAUZE/BANDAGES/DRESSINGS) ×1 IMPLANT
DRAPE C-ARM 42X72 X-RAY (DRAPES) ×3 IMPLANT
DRAPE CHEST BREAST 15X10 FENES (DRAPES) ×3 IMPLANT
DRSG COVADERM 4X6 (GAUZE/BANDAGES/DRESSINGS) ×3 IMPLANT
GAUZE SPONGE 2X2 8PLY STRL LF (GAUZE/BANDAGES/DRESSINGS) IMPLANT
GAUZE SPONGE 4X4 16PLY XRAY LF (GAUZE/BANDAGES/DRESSINGS) ×6 IMPLANT
GLOVE BIO SURGEON STRL SZ7.5 (GLOVE) ×3 IMPLANT
GLOVE BIOGEL PI IND STRL 6.5 (GLOVE) ×1 IMPLANT
GLOVE BIOGEL PI IND STRL 8 (GLOVE) ×1 IMPLANT
GLOVE BIOGEL PI INDICATOR 6.5 (GLOVE) ×2
GLOVE BIOGEL PI INDICATOR 8 (GLOVE) ×2
GLOVE ECLIPSE 6.5 STRL STRAW (GLOVE) ×3 IMPLANT
GOWN STRL REUS W/ TWL LRG LVL3 (GOWN DISPOSABLE) ×2 IMPLANT
GOWN STRL REUS W/TWL LRG LVL3 (GOWN DISPOSABLE) ×4
K-WIRE 5.6 (WIRE) ×3 IMPLANT
KIT BASIN OR (CUSTOM PROCEDURE TRAY) ×3 IMPLANT
KIT ROOM TURNOVER OR (KITS) ×3 IMPLANT
NEEDLE 18GX1X1/2 (RX/OR ONLY) (NEEDLE) ×3 IMPLANT
NEEDLE 22X1 1/2 (OR ONLY) (NEEDLE) ×3 IMPLANT
NEEDLE HYPO 25GX1X1/2 BEV (NEEDLE) ×3 IMPLANT
NS IRRIG 1000ML POUR BTL (IV SOLUTION) ×3 IMPLANT
PACK SURGICAL SETUP 50X90 (CUSTOM PROCEDURE TRAY) ×3 IMPLANT
PAD ARMBOARD 7.5X6 YLW CONV (MISCELLANEOUS) ×6 IMPLANT
SPONGE GAUZE 2X2 STER 10/PKG (GAUZE/BANDAGES/DRESSINGS)
SUT ETHILON 3 0 PS 1 (SUTURE) ×3 IMPLANT
SUT VICRYL 4-0 PS2 18IN ABS (SUTURE) ×3 IMPLANT
SYR 20CC LL (SYRINGE) ×6 IMPLANT
SYR 5ML LL (SYRINGE) ×6 IMPLANT
SYR CONTROL 10ML LL (SYRINGE) ×3 IMPLANT
SYRINGE 10CC LL (SYRINGE) ×6 IMPLANT
WATER STERILE IRR 1000ML POUR (IV SOLUTION) ×3 IMPLANT

## 2016-08-31 NOTE — Progress Notes (Signed)
Nutrition Follow-up  DOCUMENTATION CODES:   Not applicable  INTERVENTION:    Nepro formula at goal rate of 20 ml/hr with Prostat liquid protein 5 times daily  Total TF regimen to provide 1364 kcals, 114 gm protein, 349 ml of free water  NUTRITION DIAGNOSIS:   Inadequate oral intake related to inability to eat as evidenced by NPO status; ongoing  GOAL:   Patient will meet greater than or equal to 90% of their needs; progressing  MONITOR:   Weight trends, Vent status, TF tolerance, Labs, Skin, I & O's  ASSESSMENT:   60 y/o woman with DM2, HTN, HL and noncompliance presented to [facility] ER with infrerior posterior STEMI on 9/12. Taken to cath lab and found to have occluded small to moderate OM-1 branch. Underwent successful PCS with stent x 2 by Dr. Clayborn Bigness. Coronaries otherwise ok. EF 55-60% with severe MR confirmed by echo.    Pt s/p procedures 9/20: MITRAL VALVE REPAIR (MVR)  CORONARY ARTERY BYPASS GRAFTING (CABG)  INTRAOPERATIVE TRANSESOPHAGEAL ECHOCARDIOGRAM   Patient is currently intubated on ventilator support MV: 9.5 L/min Temp (24hrs), Avg:97.3 F (36.3 C), Min:95.8 F (35.4 C), Max:98.6 F (37 C)  Nepro formula initiated 9/27 >> currently infusing at 40 ml/hr via CORTRAK small bore feeding tube (tip likely in distal duodenum). This is providing 1728 kcals, 78 gm protein, 698 ml of free water. Continues on CVVHD >> for tunneled dialysis catheter today. Mental status improving; has R side weakness. Labs and medications reviewed.  CBG (last 3)   Recent Labs  08/31/16 0353 08/31/16 0824 08/31/16 1223  GLUCAP 171* 186* 157*   Diet Order:  Diet NPO time specified Except for: Sips with Meds  Skin:  Reviewed, no issues  Last BM:  9/28  Height:   Ht Readings from Last 1 Encounters:  08/18/16 5\' 1"  (1.549 m)    Weight:   Wt Readings from Last 1 Encounters:  08/31/16 156 lb 8.4 oz (71 kg)  Admit weight (08/18/16) 156 lb (71.2 kg)  Ideal Body Weight:   47.7 kg  BMI:  Body mass index is 29.58 kg/m.  Estimated Nutritional Needs:   Kcal:  0488  Protein:  105-115 gm  Fluid:  per MD  EDUCATION NEEDS:   No education needs identified at this time  Arthur Holms, RD, LDN Pager #: [phone] After-Hours Pager #: [phone]

## 2016-08-31 NOTE — Progress Notes (Addendum)
[facility] DAILY ICU PROGRESS NOTE                   [address]            [place] [address]          [phone]   8 Days Post-Op Procedure(s) (LRB): MITRAL VALVE REPAIR (MVR) USING 25MM EDWARDS MAGNA EASE BIOPROSTHESIS MITRAL  VALVE (N/A) INTRAOPERATIVE TRANSESOPHAGEAL ECHOCARDIOGRAM (N/A) ENDOVEIN HARVEST OF GREATER SAPHENOUS VEIN (Left) CENTRAL LINE INSERTION RIGHT SUBCLAVIAN (Right)  Total Length of Stay:  LOS: 13 days   Subjective: Very agitated overnight, ativan did drop BP- improved hemodynamics currently. Did pass a small bloody stool  Objective: Vital signs in last 24 hours: Temp:  [95.8 F (35.4 C)-98.1 F (36.7 C)] 96.1 F (35.6 C) (09/28 0530) Pulse Rate:  [37-135] 84 (09/28 0700) Cardiac Rhythm: Atrial fibrillation (09/28 0600) Resp:  [13-35] 18 (09/28 0700) BP: (81-168)/(56-100) 125/69 (09/28 0700) SpO2:  [93 %-100 %] 100 % (09/28 0700) Arterial Line BP: (78-183)/(46-95) 134/66 (09/28 0645) FiO2 (%):  [40 %] 40 % (09/28 0500) Weight:  [156 lb 8.4 oz (71 kg)] 156 lb 8.4 oz (71 kg) (09/28 0130)  Filed Weights   08/29/16 0500 08/30/16 0312 08/31/16 0130  Weight: 164 lb 14.5 oz (74.8 kg) 156 lb 12 oz (71.1 kg) 156 lb 8.4 oz (71 kg)    Weight change: -3.5 oz (-0.1 kg)   Hemodynamic parameters for last 24 hours: CVP:  [10 mmHg-41 mmHg] 12 mmHg  Intake/Output from previous day: 09/27 0701 - 09/28 0700 In: 3050.6 [I.V.:969.6; NG/GT:780; IV Piggyback:1301] Out: 4612 [Emesis/NG output:50]  Intake/Output this shift: No intake/output data recorded.  Vent Mode: PRVC FiO2 (%):  [40 %] 40 % Set Rate:  [16 bmp] 16 bmp Vt Set:  [400 mL] 400 mL PEEP:  [5 cmH20] 5 cmH20 Pressure Support:  [5 cmH20] 5 cmH20 Plateau Pressure:  [18 cmH20-23 cmH20] 21 cmH20   Current Meds: Scheduled Meds: . chlorhexidine gluconate (MEDLINE KIT)  15 mL Mouth Rinse BID  . Chlorhexidine Gluconate Cloth  6 each Topical Daily  . clopidogrel  75 mg Oral Daily  .  famotidine (PEPCID) IV  20 mg Intravenous Q12H  . feeding supplement (NEPRO CARB STEADY)  1,000 mL Per Tube Q24H  . insulin aspart  0-24 Units Subcutaneous Q4H  . insulin detemir  10 Units Subcutaneous BID  . mouth rinse  15 mL Mouth Rinse 10 times per day  . mupirocin ointment  1 application Nasal BID  . piperacillin-tazobactam  3.375 g Intravenous Q6H  . sodium chloride flush  3 mL Intravenous Q12H  . sodium phosphate  Dextrose 5% IVPB  30 mmol Intravenous Once  . vancomycin  1,000 mg Intravenous Q24H   Continuous Infusions: . sodium chloride Stopped (08/27/16 0900)  . sodium chloride    . sodium chloride 20 mL/hr at 08/30/16 0557  . amiodarone 30 mg/hr (08/31/16 0600)  . dexmedetomidine 0.7 mcg/kg/hr (08/31/16 0600)  . heparin 10,000 units/ 20 mL infusion syringe 1,450 Units/hr (08/31/16 0651)  . lidocaine Stopped (08/29/16 0900)  . milrinone 0.252 mcg/kg/min (08/30/16 2100)  . dialysis replacement fluid (prismasate) 600 mL/hr at 08/31/16 0345  . dialysis replacement fluid (prismasate) 200 mL/hr at 08/31/16 0610  . dialysate (PRISMASATE) 1,000 mL/hr at 08/31/16 0503   PRN Meds:.sodium chloride, Place/Maintain arterial line **AND** sodium chloride, fentaNYL (SUBLIMAZE) injection, heparin, heparin, HYDROcodone-acetaminophen, LORazepam, magnesium hydroxide, ondansetron (ZOFRAN) IV, ondansetron (ZOFRAN) IV, sodium chloride, sodium chloride flush, sodium  chloride flush  General appearance: alert, delirious and no distress Neurologic: moving extremities spontaneously Heart: regular rate and rhythm and + 2/6 systolic murmur Lungs: dim in bases Abdomen: + BS, no guarding or rebound, non-distended Extremities: some edema Wound: appear ok  Lab Results: CBC: Recent Labs  08/30/16 0324 08/31/16 0336  WBC 21.2* 20.5*  HGB 8.6* 8.6*  HCT 26.0* 26.5*  PLT 152 157   BMET:  Recent Labs  08/30/16 1546 08/31/16 0336  NA 137 137  135  K 3.7 4.1  4.0  CL 101 105  102  CO2 24 25   25   GLUCOSE 154* 169*  169*  BUN 20 19  19   CREATININE 1.34* 1.30*  1.34*  CALCIUM 8.1* 8.2*  8.2*    CMET:   ABG    Component Value Date/Time   PHART 7.453 (H) 08/31/2016 0344   PCO2ART 36.4 08/31/2016 0344   PO2ART 100.0 08/31/2016 0344   HCO3 25.6 08/31/2016 0344   TCO2 27 08/31/2016 0344   ACIDBASEDEF 3.0 (H) 08/26/2016 1615   O2SAT 98.0 08/31/2016 0344   Results for orders placed or performed during the hospital encounter of 08/18/16  Surgical pcr screen     Status: None   Collection Time: 08/18/16  2:54 PM  Result Value Ref Range Status   MRSA, PCR NEGATIVE NEGATIVE Final   Staphylococcus aureus NEGATIVE NEGATIVE Final    Comment:        The Xpert SA Assay (FDA approved for NASAL specimens in patients over 50 years of age), is one component of a comprehensive surveillance program.  Test performance has been validated by [facility] for patients greater than or equal to 47 year old. It is not intended to diagnose infection nor to guide or monitor treatment.   Culture, respiratory (NON-Expectorated)     Status: None   Collection Time: 08/18/16  6:04 PM  Result Value Ref Range Status   Specimen Description TRACHEAL ASPIRATE  Final   Special Requests Normal  Final   Gram Stain   Final    MODERATE WBC PRESENT,BOTH PMN AND MONONUCLEAR ABUNDANT GRAM POSITIVE COCCI IN PAIRS AND CHAINS RARE YEAST    Culture   Final    MODERATE STREPTOCOCCUS AGALACTIAE TESTING AGAINST S. AGALACTIAE NOT ROUTINELY PERFORMED DUE TO PREDICTABILITY OF AMP/PEN/VAN SUSCEPTIBILITY. FEW STAPHYLOCOCCUS AUREUS    Report Status 08/22/2016 FINAL  Final   Organism ID, Bacteria STAPHYLOCOCCUS AUREUS  Final      Susceptibility   Staphylococcus aureus - MIC*    CIPROFLOXACIN <=0.5 SENSITIVE Sensitive     ERYTHROMYCIN 0.5 SENSITIVE Sensitive     GENTAMICIN <=0.5 SENSITIVE Sensitive     OXACILLIN <=0.25 SENSITIVE Sensitive     TETRACYCLINE <=1 SENSITIVE Sensitive     VANCOMYCIN 1  SENSITIVE Sensitive     TRIMETH/SULFA <=10 SENSITIVE Sensitive     CLINDAMYCIN <=0.25 SENSITIVE Sensitive     RIFAMPIN <=0.5 SENSITIVE Sensitive     Inducible Clindamycin NEGATIVE Sensitive     * FEW STAPHYLOCOCCUS AUREUS  Culture, blood (Routine X 2) w Reflex to ID Panel     Status: None   Collection Time: 08/18/16  6:50 PM  Result Value Ref Range Status   Specimen Description BLOOD RIGHT ANTECUBITAL  Final   Special Requests BOTTLES DRAWN AEROBIC AND ANAEROBIC 5CC  Final   Culture NO GROWTH 5 DAYS  Final   Report Status 08/23/2016 FINAL  Final  Culture, blood (Routine X 2) w Reflex to ID Panel  Status: None   Collection Time: 08/18/16  7:00 PM  Result Value Ref Range Status   Specimen Description BLOOD RIGHT HAND  Final   Special Requests BOTTLES DRAWN AEROBIC AND ANAEROBIC 3CC  Final   Culture NO GROWTH 5 DAYS  Final   Report Status 08/23/2016 FINAL  Final  Surgical pcr screen     Status: Abnormal   Collection Time: 08/22/16  8:32 PM  Result Value Ref Range Status   MRSA, PCR NEGATIVE NEGATIVE Final   Staphylococcus aureus POSITIVE (A) NEGATIVE Final    Comment:        The Xpert SA Assay (FDA approved for NASAL specimens in patients over 23 years of age), is one component of a comprehensive surveillance program.  Test performance has been validated by [facility] for patients greater than or equal to 51 year old. It is not intended to diagnose infection nor to guide or monitor treatment.   Culture, blood (routine x 2)     Status: Abnormal   Collection Time: 08/27/16  9:00 AM  Result Value Ref Range Status   Specimen Description BLOOD LEFT HAND  Final   Special Requests IN PEDIATRIC BOTTLE 3CC  Final   Culture  Setup Time   Final    GRAM POSITIVE COCCI IN CLUSTERS IN PEDIATRIC BOTTLE Organism ID to follow CRITICAL RESULT CALLED TO, READ BACK BY AND VERIFIED WITH: A. Pablo Lawrence.D. 16:15 08/29/16 ([email or web address])    Culture (A)  Final    STAPHYLOCOCCUS SPECIES  (COAGULASE NEGATIVE) THE SIGNIFICANCE OF ISOLATING THIS ORGANISM FROM A SINGLE SET OF BLOOD CULTURES WHEN MULTIPLE SETS ARE DRAWN IS UNCERTAIN. PLEASE NOTIFY THE MICROBIOLOGY DEPARTMENT WITHIN ONE WEEK IF SPECIATION AND SENSITIVITIES ARE REQUIRED.    Report Status 08/30/2016 FINAL  Final  Blood Culture ID Panel (Reflexed)     Status: Abnormal   Collection Time: 08/27/16  9:00 AM  Result Value Ref Range Status   Enterococcus species NOT DETECTED NOT DETECTED Final   Listeria monocytogenes NOT DETECTED NOT DETECTED Final   Staphylococcus species DETECTED (A) NOT DETECTED Final    Comment: CRITICAL RESULT CALLED TO, READ BACK BY AND VERIFIED WITH: A. Pablo Lawrence.D. 16:15 08/29/16 ([email or web address])    Staphylococcus aureus NOT DETECTED NOT DETECTED Final   Methicillin resistance DETECTED (A) NOT DETECTED Final    Comment: CRITICAL RESULT CALLED TO, READ BACK BY AND VERIFIED WITH: A. Pablo Lawrence.D. 16:15 08/29/16 ([email or web address])    Streptococcus species NOT DETECTED NOT DETECTED Final   Streptococcus agalactiae NOT DETECTED NOT DETECTED Final   Streptococcus pneumoniae NOT DETECTED NOT DETECTED Final   Streptococcus pyogenes NOT DETECTED NOT DETECTED Final   Acinetobacter baumannii NOT DETECTED NOT DETECTED Final   Enterobacteriaceae species NOT DETECTED NOT DETECTED Final   Enterobacter cloacae complex NOT DETECTED NOT DETECTED Final   Escherichia coli NOT DETECTED NOT DETECTED Final   Klebsiella oxytoca NOT DETECTED NOT DETECTED Final   Klebsiella pneumoniae NOT DETECTED NOT DETECTED Final   Proteus species NOT DETECTED NOT DETECTED Final   Serratia marcescens NOT DETECTED NOT DETECTED Final   Haemophilus influenzae NOT DETECTED NOT DETECTED Final   Neisseria meningitidis NOT DETECTED NOT DETECTED Final   Pseudomonas aeruginosa NOT DETECTED NOT DETECTED Final   Candida albicans NOT DETECTED NOT DETECTED Final   Candida glabrata NOT DETECTED NOT DETECTED Final   Candida krusei NOT DETECTED NOT  DETECTED Final   Candida parapsilosis NOT DETECTED NOT DETECTED Final   Candida tropicalis NOT DETECTED NOT DETECTED Final  Culture, blood (routine x 2)     Status: None (Preliminary result)   Collection Time: 08/27/16  9:19 AM  Result Value Ref Range Status   Specimen Description BLOOD RIGHT ASSIST CONTROL  Final   Special Requests BOTTLES DRAWN AEROBIC ONLY 4CC  Final   Culture NO GROWTH 3 DAYS  Final   Report Status PENDING  Incomplete      PT/INR: No results for input(s): LABPROT, INR in the last 72 hours. Radiology: No results found.  Assessment/Plan: S/P Procedure(s) (LRB): MITRAL VALVE REPAIR (MVR) USING 25MM EDWARDS MAGNA EASE BIOPROSTHESIS MITRAL  VALVE (N/A) INTRAOPERATIVE TRANSESOPHAGEAL ECHOCARDIOGRAM (N/A) ENDOVEIN HARVEST OF GREATER SAPHENOUS VEIN (Left) CENTRAL LINE INSERTION RIGHT SUBCLAVIAN (Right)   1 currently in sinus on amio 2 stable hemodynamics but did have some pressure issues - she is being managed by AHF team 3 leukocytosis about same without fevers- cont current abx 4 bloody stool- monitor, H/H are stable, monitor exam. May need further evaluation. 5 CCM managing vent 6 on TF 7 renal managing AKI  GOLD,WAYNE E 08/31/2016 7:20 AM    Chart reviewed, patient examined, agree with above. Labile BP on Milrinone 0.25, intermittent neo Multisystem organ failure on vent and CRRT.

## 2016-08-31 NOTE — Progress Notes (Signed)
CRITICAL VALUE ALERT  Critical value received:  Vanc trough 21  Date of notification:  08/31/2016  Time of notification:  0700  Critical value read back:Yes.    Nurse who received alert:  Rhina Brackett, RN  MD notified (1st page):  Jeneen Rinks, pharmacist notified  Time of first page:  0700  MD notified (2nd page):  Time of second page:  Responding MD:  Jeneen Rinks, in pharmacist  Time MD responded:  0700

## 2016-08-31 NOTE — Interval H&P Note (Signed)
History and Physical Interval Note:  08/31/2016 2:13 PM  Deborah [name]  has presented today for surgery, with the diagnosis of Acute renal insufficiency N28.9  The various methods of treatment have been discussed with the patient and family. After consideration of risks, benefits and other options for treatment, the patient has consented to  Procedure(s): INSERTION OF DIALYSIS CATHETER (N/A) as a surgical intervention .  The patient's history has been reviewed, patient examined, no change in status, stable for surgery.  I have reviewed the patient's chart and labs.  Questions were answered to the patient's satisfaction.     [name]

## 2016-08-31 NOTE — Progress Notes (Addendum)
Pharmacy Antibiotic Note  Deborah [name] is a 60 y.o. female continues on vancomycin and zosyn for fever after completing therapy with ceftriaxone for group B strep/MSSA pneumonia. She is also on CRRT. Vancomycin trough drawn today is slightly above goal at 21 - drawn ~ 1 hour early so true trough likely within goal range of 15-20. To prevent accumulation in case she doesn't tolerate CRRT and/or it gets stopped for some reason, will decrease dose. She is now afebrile and WBC improving.   Plan: 1) Decrease vancomycin to 750mg  IV q24 2) Continue zosyn 3.375g IV q6  Height: 5\' 1"  (154.9 cm) (from 08/15/16 encounter) Weight: 156 lb 8.4 oz (71 kg) IBW/kg (Calculated) : 47.8  Temp (24hrs), Avg:97.1 F (36.2 C), Min:95.8 F (35.4 C), Max:98.1 F (36.7 C)   Recent Labs Lab 08/28/16 0429  08/29/16 0431  08/29/16 1550 08/29/16 1715 08/29/16 1900 08/30/16 0324 08/30/16 1546 08/31/16 0336 08/31/16 0615  WBC 30.1*  --  30.1*  --   --   --  29.2* 21.2*  --  20.5*  --   CREATININE 1.57*  < > 1.43*  < > 1.11* 1.40*  --  1.49* 1.34* 1.30*  1.34*  --   VANCOTROUGH  --   --   --   --   --   --   --   --   --   --  21*  < > = values in this interval not displayed.  Estimated Creatinine Clearance: 40.7 mL/min (by C-G formula based on SCr of 1.34 mg/dL (H)).    No Known Allergies  Antimicrobials this admission: 9/16 Vancomycin >> 9/18, 9/20>>9/22, resume 9/25>> 9/16 Zosyn >> 9/18, resume 9/25>> 9/25 Fluconazole x 3 doses CTX 9/18>> 9/24  Dose adjustments this admission: 9/17 Vanc random = 16, cont 1 g q48h 9/28 Vanc trough (on CRRT) = 21, cont 1g q24  Microbiology results: 9/15 BCx: ngf 9/15 Sputum: mod group b strep/MSSA 9/15 MRSA PCR: negative 9/24 BCx2: 1/2 BCID CoNS likely contaminant  Thank you for allowing pharmacy to be a part of this patient's care.  Deboraha Sprang 08/31/2016 8:41 AM

## 2016-08-31 NOTE — Progress Notes (Signed)
Patient ID: Deborah [name], female   DOB: 09-13-1956, 60 y.o.   MRN: [id number]  SICU Evening Rounds:   Hemodynamically stable  Sinus tachy 104 on milrinone.  Weaned on vent today and reportedly awake but does not follow commands   CRRT removing 75 cc/hr.  Tolerating TF at [employer]. Bowels working.   CBC    Component Value Date/Time   WBC 20.5 (H) 08/31/2016 0336   RBC 2.97 (L) 08/31/2016 0336   HGB 9.2 (L) 08/31/2016 1702   HCT 27.0 (L) 08/31/2016 1702   PLT 157 08/31/2016 0336   MCV 89.2 08/31/2016 0336   MCH 29.0 08/31/2016 0336   MCHC 32.5 08/31/2016 0336   RDW 16.0 (H) 08/31/2016 0336   LYMPHSABS 2.3 08/23/2016 0328   MONOABS 4.1 (H) 08/23/2016 0328   EOSABS 0.5 08/23/2016 0328   BASOSABS 0.0 08/23/2016 0328     BMET    Component Value Date/Time   NA 139 08/31/2016 1702   K 4.0 08/31/2016 1702   CL 103 08/31/2016 1657   CO2 20 (L) 08/31/2016 1657   GLUCOSE 168 (H) 08/31/2016 1702   BUN 22 (H) 08/31/2016 1657   CREATININE 1.70 (H) 08/31/2016 1657   CREATININE 1.41 (H) 10/04/2012 1631   CALCIUM 7.8 (L) 08/31/2016 1657   GFRNONAA 32 (L) 08/31/2016 1657   GFRAA 37 (L) 08/31/2016 1657     A/P:  Stable day. Continue current plans

## 2016-08-31 NOTE — Progress Notes (Signed)
Patient returned from OR post HD catheter placement and central line placement. CCM called. Per Dr. Halford Chessman, placement ok with lines, ok to use. Will hold off extubation until tomorrow. Will resume tube feeding at this time. Will cont to monitor.

## 2016-08-31 NOTE — Op Note (Signed)
    NAME: Deborah [name]   MRN: [id number] DOB: [birth year]    DATE OF OPERATION: 08/31/2016  PREOP DIAGNOSIS: Chronic kidney disease  POSTOP DIAGNOSIS: Same  PROCEDURE:  1. Placement of 28 cm left IJ tunneled dialysis catheter 2. Ultrasound-guided placement of right IJ triple lumen catheter 3. Removal of right subclavian vein triple-lumen catheter  SURGEON: Judeth Cornfield. Scot Dock, MD, FACS  ASSIST: None  ANESTHESIA: Local with sedation   EBL: Minimal  INDICATIONS: Deborah [name] is a 60 y.o. female who has a temporary catheter in the left IJ was placed at another hospital. We are asked to change this to a tunneled dialysis catheter. In addition we were asked to change her central lumen catheter.  FINDINGS: Both catheters were in the good position at the completion of the procedure.  TECHNIQUE: The patient was taken to the operating room directly. She was already on the ventilator. The neck and upper chest and both current catheters were prepped into the field. The left IJ temporary catheter was clamped and then cut. The distal aspect of the catheter was removed. A wire was advanced through the catheter down into the right atrium and the removed. The dilator and peel-away sheath were advanced over the wire and the dilator was removed. The 28 cm catheter was threaded over the wire through the peel-away sheath down into the right atrium and then the wire and peel-away sheath were removed. The exit site for the catheter was selected and the skin anesthetized between the 2 areas. The catheters and brought to the tunnel cut the appropriate length and the distal ports were attached. Both ports withdrew easily and flushed with heparin saline filled concentrated heparin. The catheter was secured at its exit site with a 3-0 nylon suture. The agger cannulation site was closed with a 4-0 subcuticular stitch.  Next attention was turned to placement of a right IJ triple-lumen catheter. Under ultrasound  guidance, after the skin was anesthetized, the right IJ was cannulated and a guidewire introduced into the right atrium under fluoroscopic control. The tract was dilated. A triple-lumen catheter was advanced over the wire and the wire was removed. All 3 ports withdrew easily with an flushed with heparin saline. Catheter was secured at its exit site with a 3-0 nylon suture. Her pressers were then connected to the new IV and the old right subclavian vein catheter was clamped. It was then divided and removed and pressure held for hemostasis.  Sterile dressings were applied. The patient tolerated the procedure well and was transferred back to the intensive care unit in critical condition. All needle and sponge counts were correct.  [name], MD, FACS [facility]  DATE OF DICTATION:   08/31/2016

## 2016-08-31 NOTE — Transfer of Care (Signed)
Immediate Anesthesia Transfer of Care Note  Patient: LILYANA LIPPMAN  Procedure(s) Performed: Procedure(s): INSERTION OF DIALYSIS CATHETER LEFT INTERNAL JUGULAR VEIN & INSERTION OF TRIPLE LUMEN RIGHT INTERNAL JUGULAR VEIN (Bilateral)  Patient Location: SICU  Anesthesia Type:General  Level of Consciousness: sedated and Patient remains intubated per anesthesia plan  Airway & Oxygen Therapy: Patient placed on Ventilator (see vital sign flow sheet for setting) and report given to receiving RN  Post-op Assessment: Report given to RN and Post -op Vital signs reviewed and stable  Post vital signs: Reviewed  Last Vitals:  Vitals:   08/31/16 1655 08/31/16 1700  BP:  128/81  Pulse: (!) 113 (!) 110  Resp: (!) 24 18  Temp:  (!) 35.6 C    Last Pain:  Vitals:   08/31/16 1700  TempSrc: Axillary  PainSc:          Complications: No apparent anesthesia complications

## 2016-08-31 NOTE — Progress Notes (Signed)
Subjective: Interval History: for PC , bp volatile with sedation. Weaning yest  Objective: Vital signs in last 24 hours: Temp:  [95.8 F (35.4 C)-98.1 F (36.7 C)] 96.1 F (35.6 C) (09/28 0530) Pulse Rate:  [37-135] 90 (09/28 0645) Resp:  [13-35] 22 (09/28 0645) BP: (81-168)/(56-100) 144/94 (09/28 0600) SpO2:  [93 %-100 %] 100 % (09/28 0645) Arterial Line BP: (78-183)/(46-95) 134/66 (09/28 0645) FiO2 (%):  [40 %] 40 % (09/28 0500) Weight:  [71 kg (156 lb 8.4 oz)] 71 kg (156 lb 8.4 oz) (09/28 0130) Weight change: -0.1 kg (-3.5 oz)  Intake/Output from previous day: 09/27 0701 - 09/28 0700 In: 3030.6 [I.V.:949.6; NG/GT:780; IV Piggyback:1301] Out: 4612 [Emesis/NG output:50] Intake/Output this shift: No intake/output data recorded.  General appearance: eyes open , not responsive Neck: L IJ cath Resp: rales bibasilar and rhonchi bibasilar Cardio: S1, S2 normal and systolic murmur: holosystolic 2/6, blowing at apex GI: pos bs, soft,  Extremities: edema 2+  Lab Results:  Recent Labs  08/30/16 0324 08/31/16 0336  WBC 21.2* 20.5*  HGB 8.6* 8.6*  HCT 26.0* 26.5*  PLT 152 157   BMET:  Recent Labs  08/30/16 1546 08/31/16 0336  NA 137 137  135  K 3.7 4.1  4.0  CL 101 105  102  CO2 24 25  25   GLUCOSE 154* 169*  169*  BUN 20 19  19   CREATININE 1.34* 1.30*  1.34*  CALCIUM 8.1* 8.2*  8.2*   No results for input(s): PTH in the last 72 hours. Iron Studies: No results for input(s): IRON, TIBC, TRANSFERRIN, FERRITIN in the last 72 hours.  Studies/Results: Dg Chest Port 1 View  Result Date: 08/30/2016 CLINICAL DATA:  60 year old female status post mitral valve replacement. Initial encounter. EXAM: PORTABLE CHEST 1 VIEW COMPARISON:  08/29/2016 and earlier. FINDINGS: Portable AP semi upright view at 0628 hours. Stable endotracheal tube. Visible enteric tubes appear stable. Stable dual-lumen left IJ approach central line. Stable right subclavian approach central line.  Stable cardiac size and mediastinal contours. Continued veiling opacity at both lung bases an diffuse pulmonary vascular congestion. No pneumothorax. Stable ventilation since yesterday. IMPRESSION: 1.  Stable lines and tubes. 2. Stable ventilation since yesterday with pulmonary interstitial edema superimposed on bilateral pleural effusions and lower lobe collapse or consolidation. Electronically Signed   By: Genevie Ann M.D.   On: 08/30/2016 07:34   Dg Abd Portable 1v  Result Date: 08/29/2016 CLINICAL DATA:  Status post feeding tube placement EXAM: PORTABLE ABDOMEN - 1 VIEW COMPARISON:  Supine abdominal radiograph of August 25, 2016 FINDINGS: The tip of the feeding tube projects to the left of midline. This may have read curved upon itself and lie in the gastric body but may also lie in the fourth portion of the duodenum or at the proximal portion of the jejunum. The bowel gas pattern is unremarkable. IMPRESSION: The feeding tube tip lies in likely in the distal duodenum just proximal to the jejunum but the possibility of it having re- curved upon itself and still lie in the gastric body is raised. A cross-table lateral radiograph may be useful. Electronically Signed   By: David  Martinique M.D.   On: 08/29/2016 12:34    I have reviewed the patient's current medications.  Assessment/Plan: 1 AKI ATN, vol xs but improving CXR vol xs, acid/base/K ok.  Phos low replete 2 MVR CABG  Per CVS 3 CAD 4 Resp failure per CCM, weaining 5 ^ WBC on AB, to get line change 6  DM controlled.   7 Nutrition TF P CRRT, remove vol, solute, new PC, cont AB, wean.      LOS: 13 days   Deborah [name] L 08/31/2016,7:02 AM

## 2016-08-31 NOTE — H&P (View-Only) (Signed)
Patient ID: Deborah [name], female   DOB: 29-Sep-1956, 60 y.o.   MRN: [id number] More hemodynamically stable. Off inotropes. Arousable on vent. Plan tunneled catheter tomorrow in the operating room.

## 2016-08-31 NOTE — Anesthesia Procedure Notes (Signed)
Date/Time: 08/31/2016 3:03 PM Performed by: Jenne Campus Pre-anesthesia Checklist: Patient identified, Emergency Drugs available, Suction available, Patient being monitored and Timeout performed Patient Re-evaluated:Patient Re-evaluated prior to inductionOxygen Delivery Method: Circle system utilized Intubation Type: Inhalational induction with existing ETT

## 2016-08-31 NOTE — Anesthesia Preprocedure Evaluation (Signed)
Anesthesia Evaluation  Patient identified by MRN, date of birth, ID band Patient awake and Patient confused    Reviewed: Allergy & Precautions, NPO status , Patient's Chart, lab work & pertinent test results, Unable to perform ROS - Chart review only  History of Anesthesia Complications Negative for: history of anesthetic complications  Airway Mallampati: Intubated       Dental  (+) Loose   Pulmonary neg pulmonary ROS,  Remains intubated, ventilated and sedated   breath sounds clear to auscultation       Cardiovascular hypertension, (-) angina (off of IABP now)+ Past MI   Rhythm:Regular Rate:Normal  08/27/16 ECHO: EF 70%, vigorous LVF, mitral valve functioning well   Neuro/Psych    GI/Hepatic negative GI ROS, GERD  Medicated and Controlled,  Endo/Other  diabetes  Renal/GU Renal Insufficiency 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, required CVVH/dialysis)     Musculoskeletal   Abdominal   Peds  Hematology  (+) Blood dyscrasia (Hb 8.6), anemia ,   Anesthesia Other Findings   Reproductive/Obstetrics                             Anesthesia Physical Anesthesia Plan  ASA: IV  Anesthesia Plan: General   Post-op Pain Management:    Induction: Inhalational  Airway Management Planned: Oral ETT  Additional Equipment: Arterial line  Intra-op Plan: Utilization of Controlled Hypotension per surrgeon request  Post-operative Plan: Extubation in OR  Informed Consent:   History available from chart only  Plan Discussed with: CRNA and Surgeon  Anesthesia Plan Comments: (Plan routine monitors, existing ETT, GETA)        Anesthesia Quick Evaluation

## 2016-08-31 NOTE — Progress Notes (Addendum)
  Recheck coox 54.4%. Will not wean milrinone today.    Legrand Como [address]" [place], PA-C 08/31/2016 1:30 PM

## 2016-08-31 NOTE — Progress Notes (Signed)
Patient ID: Deborah [name], female   DOB: Oct 15, 1956, 60 y.o.   MRN: [id number]   60 y/o woman with DM2, HTN, HL and noncompliance presented to [facility] ER with infrerior posterior STEMI on 9/12. Taken to cath lab and found to have occluded small to moderate OM-1 branch. Underwent successful PCS with stent x 2 by Dr. Clayborn Bigness. Coronaries otherwise ok. EF 55-60% with severe MR confirmed by echo.   Post-cath developed respiratory failure and required intubation. Developed shock and renal failure with peak creatinine 3.4 (1.6 on admit). Trialysis catheter placed.   Patient persistently acidotic with concern for ischemic MR and transferred here. En route develop severe hypotension and norepinephrine started.   Brought to cath lab 9/15 and TEE showed normal LV (EF 60-65%) and normal RV function with ruptured posterior papillary muscle with severe posterior MR.  Underwent placement of Swan and IABP.   S/P MVR 08/23/16.  IABP pulled 9/22.   Echo 9/23  RV and LV are normal (EF 60%) but appeared underfilled   Coox 40.9% this am on milrinone 0.25 -> 54.4% on recheck.   Intubated. Per nurses awakens at time, but not on my exam.   Out 1.6 L yesterday.  Weight stable.   BCx + for MRSA. Vanc/zosyn started 9/24. WBC trending down.  Remains on CVVHD pulling 75/hr.     Scheduled Meds: . Chlorhexidine Gluconate Cloth  6 each Topical Daily  . clopidogrel  75 mg Oral Daily  . famotidine (PEPCID) IV  20 mg Intravenous Q12H  . feeding supplement (NEPRO CARB STEADY)  1,000 mL Per Tube Q24H  . heparin  1,000-6,000 Units Intravenous Once  . insulin aspart  0-24 Units Subcutaneous Q4H  . insulin detemir  10 Units Subcutaneous BID  . mupirocin ointment  1 application Nasal BID  . piperacillin-tazobactam  3.375 g Intravenous Q6H  . sodium chloride flush  3 mL Intravenous Q12H  . sodium phosphate  Dextrose 5% IVPB  30 mmol Intravenous Once  . [START ON 09/01/2016] vancomycin  750 mg Intravenous Q24H    Continuous Infusions: . sodium chloride Stopped (08/27/16 0900)  . sodium chloride    . sodium chloride 20 mL/hr at 08/31/16 1100  . amiodarone 30 mg/hr (08/31/16 1100)  . dexmedetomidine 0.703 mcg/kg/hr (08/31/16 1100)  . heparin 10,000 units/ 20 mL infusion syringe 1,650 Units/hr (08/31/16 0912)  . lidocaine Stopped (08/29/16 0900)  . milrinone 0.252 mcg/kg/min (08/31/16 1100)  . dialysis replacement fluid (prismasate) 600 mL/hr at 08/31/16 0944  . dialysis replacement fluid (prismasate) 200 mL/hr at 08/31/16 0610  . dialysate (PRISMASATE) 1,000 mL/hr at 08/31/16 1020   PRN Meds:.sodium chloride, Place/Maintain arterial line **AND** sodium chloride, fentaNYL (SUBLIMAZE) injection, fentaNYL (SUBLIMAZE) injection, heparin, HYDROcodone-acetaminophen, LORazepam, magnesium hydroxide, ondansetron (ZOFRAN) IV, sodium chloride, sodium chloride flush    Vitals:   08/31/16 0900 08/31/16 1000 08/31/16 1100 08/31/16 1138  BP: 138/71 98/73 (!) 167/89 (!) 87/52  Pulse: 81 (!) 105 (!) 113 (!) 102  Resp: 16 15 (!) 22 15  Temp:      TempSrc:      SpO2: 100% 100% 99% 100%  Weight:      Height:        Intake/Output Summary (Last 24 hours) at 08/31/16 1149 Last data filed at 08/31/16 1100  Gross per 24 hour  Intake          2827.66 ml  Output             4646 ml  Net         -  1818.34 ml    LABS: Basic Metabolic Panel:  Recent Labs  08/30/16 0324 08/30/16 1546 08/31/16 0336  NA 136 137 137  135  K 4.1 3.7 4.1  4.0  CL 102 101 105  102  CO2 25 24 25  25   GLUCOSE 179* 154* 169*  169*  BUN 21* 20 19  19   CREATININE 1.49* 1.34* 1.30*  1.34*  CALCIUM 7.9* 8.1* 8.2*  8.2*  MG 2.2  --  2.5*  PHOS 1.9* 2.9 2.1*   Liver Function Tests:  Recent Labs  08/30/16 0324 08/30/16 1546 08/31/16 0336  AST 55*  --  37  ALT 179*  --  123*  ALKPHOS 146*  --  142*  BILITOT 1.1  --  1.1  PROT 6.0*  --  6.5  ALBUMIN 2.2* 2.8* 3.1*  3.1*   No results for input(s): LIPASE,  AMYLASE in the last 72 hours. CBC:  Recent Labs  08/30/16 0324 08/31/16 0336  WBC 21.2* 20.5*  HGB 8.6* 8.6*  HCT 26.0* 26.5*  MCV 88.4 89.2  PLT 152 157   Cardiac Enzymes: No results for input(s): CKTOTAL, CKMB, CKMBINDEX, TROPONINI in the last 72 hours. BNP: Invalid input(s): POCBNP D-Dimer: No results for input(s): DDIMER in the last 72 hours. Hemoglobin A1C: No results for input(s): HGBA1C in the last 72 hours. Fasting Lipid Panel: No results for input(s): CHOL, HDL, LDLCALC, TRIG, CHOLHDL, LDLDIRECT in the last 72 hours. Thyroid Function Tests: No results for input(s): TSH, T4TOTAL, T3FREE, THYROIDAB in the last 72 hours.  Invalid input(s): FREET3 Anemia Panel: No results for input(s): VITAMINB12, FOLATE, FERRITIN, TIBC, IRON, RETICCTPCT in the last 72 hours.  RADIOLOGY: Dg Chest 1 View  Result Date: 08/17/2016 CLINICAL DATA:  Endotracheal tube placement, OG tube placement EXAM: CHEST 1 VIEW COMPARISON:  Portable chest x-ray of 08/17/2016 FINDINGS: The tip of the endotracheal tube is within the right mainstem bronchus and needs to be withdrawn by approximately 5 cm per OG tube extends below the hemidiaphragm. Bilateral primarily perihilar airspace disease again is noted most consistent with edema or possibly aspiration. A small left pleural effusion is noted. Right central venous line is unchanged. IMPRESSION: 1. Endotracheal tip is within the right mainstem bronchus and needs to be withdrawn by approximately 5 cm. 2. OG tube extends below the hemidiaphragm. 3. Little change in bilateral airspace disease with small left pleural effusion. Electronically Signed   By: Ivar Drape M.D.   On: 08/17/2016 08:05   Dg Chest 1 View  Result Date: 08/17/2016 CLINICAL DATA:  Acute respiratory failure. EXAM: CHEST 1 VIEW COMPARISON:  08/15/2016 FINDINGS: Type of the right central line in the mid SVC. The bilateral perihilar opacities are becoming more confluent, air bronchograms non noted  in the right suprahilar region. Stable cardiomegaly. Increasing left lung base atelectasis. Small pleural effusion on prior exam is not well visualized, suspect unchanged. No pneumothorax. IMPRESSION: Coalescing perihilar bilateral opacities, suspicious for increasing pulmonary edema. Increasing left lung base atelectasis. Electronically Signed   By: Jeb Levering M.D.   On: 08/17/2016 03:15   Dg Chest 1 View  Result Date: 08/15/2016 CLINICAL DATA:  Respiratory failure EXAM: CHEST 1 VIEW COMPARISON:  January 22, 2010 FINDINGS: The mediastinal contour is normal. The heart size is enlarged. The diffuse airspace opacities identified throughout bilateral lungs. There is probably a minimal left pleural effusion. The osseous structures are normal. IMPRESSION: Congestive heart failure. Electronically Signed   By: Abelardo Diesel M.D.   On: 08/15/2016  19:25   Dg Abd 1 View  Result Date: 08/17/2016 CLINICAL DATA:  Endotracheal tube placement, OG tube placement EXAM: ABDOMEN - 1 VIEW COMPARISON:  CT abdomen pelvis of 12/10/2010 FINDINGS: The OG tube tip extends into the body the stomach. The bowel gas pattern is nonspecific. Contrast is noted being excreted by the kidneys and within the urinary bladder secondary to recent CT of the abdomen pelvis. The bowel gas pattern is nonspecific. There may be a small left pleural effusion present. IMPRESSION: OG tube tip extends into the body the stomach. The bowel gas pattern is nonspecific. Electronically Signed   By: Ivar Drape M.D.   On: 08/17/2016 08:09   Ct Head Wo Contrast  Result Date: 08/28/2016 CLINICAL DATA:  Status post intubation. EXAM: CT HEAD WITHOUT CONTRAST TECHNIQUE: Contiguous axial images were obtained from the base of the skull through the vertex without intravenous contrast. COMPARISON:  CT head dated 01/22/2010. FINDINGS: Brain: There is mild generalized parenchymal atrophy with commensurate dilatation of the ventricles and sulci. Small old lacunar  infarct noted within the right basal ganglia region. There is no mass, hemorrhage, edema or other evidence of acute parenchymal abnormality. No extra-axial hemorrhage. Vascular: There are chronic calcified atherosclerotic changes of the large vessels at the skull base. No unexpected hyperdense vessel. Skull: Normal. Negative for fracture or focal lesion. Sinuses/Orbits: No acute finding. Other: None IMPRESSION: No acute findings.  No intracranial mass, hemorrhage or edema. Electronically Signed   By: Franki Cabot M.D.   On: 08/28/2016 19:27   Dg Chest Port 1 View  Result Date: 08/31/2016 CLINICAL DATA:  Mitral valve replacement. EXAM: PORTABLE CHEST 1 VIEW COMPARISON:  08/30/2016 FINDINGS: Endotracheal tube, NG tube, feeding tube, dual lumen left IJ catheter, right IJ catheter in stable position. Prior cardiac valve replacement. Cardiomegaly with bilateral pulmonary infiltrates consistent pulmonary edema again noted. Small right pleural effusion. IMPRESSION: 1. Lines and tubes in stable position. 2. Prior cardiac valve replacement. Cardiomegaly with bilateral pulmonary infiltrates consistent with pulmonary edema again noted. Small right pleural effusion. No significant interim change. Electronically Signed   By: Marcello Moores  Register   On: 08/31/2016 07:39   Dg Chest Port 1 View  Result Date: 08/30/2016 CLINICAL DATA:  60 year old female status post mitral valve replacement. Initial encounter. EXAM: PORTABLE CHEST 1 VIEW COMPARISON:  08/29/2016 and earlier. FINDINGS: Portable AP semi upright view at 0628 hours. Stable endotracheal tube. Visible enteric tubes appear stable. Stable dual-lumen left IJ approach central line. Stable right subclavian approach central line. Stable cardiac size and mediastinal contours. Continued veiling opacity at both lung bases an diffuse pulmonary vascular congestion. No pneumothorax. Stable ventilation since yesterday. IMPRESSION: 1.  Stable lines and tubes. 2. Stable ventilation  since yesterday with pulmonary interstitial edema superimposed on bilateral pleural effusions and lower lobe collapse or consolidation. Electronically Signed   By: Genevie Ann M.D.   On: 08/30/2016 07:34   Dg Chest Port 1 View  Result Date: 08/29/2016 CLINICAL DATA:  Hypoxia EXAM: PORTABLE CHEST 1 VIEW COMPARISON:  August 27, 2016 FINDINGS: Endotracheal tube tip is 3.7 cm above the carina. Central catheter tip is in the superior vena cava. Nasogastric and feeding tube tips are below the diaphragm. There is a mediastinal drain and right chest tube. No pneumothorax. There are bilateral pleural effusions with patchy airspace consolidation in the left base and somewhat generalized interstitial edema. Edema and effusions have increased compared to recent study. Consolidation in the left base is stable. Cardiomegaly is stable. The pulmonary  vascular is normal. Patient is status post mitral valve replacement. IMPRESSION: Tube and catheter positions as described without pneumothorax. Persistent consolidation left base. New pleural effusions with interstitial edema. Suspect a degree of congestive heart failure. Electronically Signed   By: Lowella Grip III M.D.   On: 08/29/2016 08:28   Dg Chest Port 1 View  Result Date: 08/27/2016 CLINICAL DATA:  60 year old female with a history of mitral valve repair EXAM: PORTABLE CHEST 1 VIEW COMPARISON:  08/27/2016, 08/26/2016, 08/25/2016 FINDINGS: Cardiomediastinal silhouette unchanged. Surgical changes of prior median sternotomy and mitral valve annuloplasty. Unchanged position of right-sided thoracostomy tube, gastric tube, right subclavian central line. Unchanged position of the endotracheal tube. Unchanged left-sided IJ central venous catheter. Unchanged position of mediastinal drain and epicardial pacing leads. Interval removal of right IJ sheath and Swan-Ganz catheter. Minimal interstitial airspace opacities at the lung bases. Blunting of left costophrenic angle with  pleural parenchymal thickening. No pneumothorax. IMPRESSION: Interval removal of right IJ sheath and Swan-Ganz catheter. Mild airspace disease at the bases with likely trace left pleural effusion. Unchanged endotracheal tube, gastric tube, right subclavian central line, left IJ catheter, mediastinal/pleural drains, and epicardial pacing leads. Surgical changes of median sternotomy and mitral valve annuloplasty. Signed, Dulcy Fanny. Earleen Newport, DO [facility] Electronically Signed   By: Corrie Mckusick D.O.   On: 08/27/2016 17:12   Dg Chest Port 1 View  Result Date: 08/26/2016 CLINICAL DATA:  Status post cardiac surgery and valve replacement. EXAM: PORTABLE CHEST 1 VIEW COMPARISON:  08/25/2016 FINDINGS: Changes from the recent cardiac surgery and mitral valve replacement are stable from most recent prior exam. Cardiac silhouette is mildly enlarged. There is no mediastinal widening. Mild lower lung zone hazy opacity is noted consistent with a combination of small pleural effusions and atelectasis. Mild interstitial thickening is noted centrally which has improved. There was no overt pulmonary edema. No pneumothorax. Endotracheal tube, right internal jugular Swan-Ganz catheter, left internal jugular dual-lumen central venous catheter, nasal/orogastric tube, right chest tube and mediastinal tube are stable in well positioned. Right subclavian central venous line catheter tip projects in the right atrium, also stable. IMPRESSION: 1. Improved lung aeration when compared the prior exam consistent with improved pulmonary edema. 2. Lung base opacity noted consistent with small effusions and atelectasis. 3. No pneumothorax.  No mediastinal widening. 4. Support apparatus is stable. Electronically Signed   By: Lajean Manes M.D.   On: 08/26/2016 08:03   Dg Chest Port 1 View  Result Date: 08/25/2016 CLINICAL DATA:  Status post mitral valve replacement EXAM: PORTABLE CHEST  1 VIEW COMPARISON:  08/24/2016 FINDINGS: Cardiac shadow is stable. Intra-aortic balloon pump is again noted just below the aortic knob. Changes of mitral valve replacement are again seen. Swan-Ganz catheter is noted in the pulmonary outflow tract. Temporary dialysis catheter is noted via the left jugular approach. Right subclavian central line is noted as well. Endotracheal tube and mediastinal drain are again noted. The degree of vascular congestion has improved somewhat in the interval from the prior exam although persists. Bilateral pleural effusions right greater than left are noted. No focal confluent infiltrate is seen. IMPRESSION: Persistent but improved vascular congestion with bilateral effusions. Tubes and lines as described. Electronically Signed   By: Inez Catalina M.D.   On: 08/25/2016 08:40   Dg Chest Port 1 View  Result Date: 08/24/2016 CLINICAL DATA:  Mitral valve replacement, chest tube EXAM: PORTABLE CHEST 1 VIEW COMPARISON:  08/23/2016 FINDINGS: PA support devices including right  chest tube, endotracheal tube Swan-Ganz catheter are unchanged. No pneumothorax. Diffuse interstitial prominence throughout the lungs is increased, likely mild edema. Mild cardiomegaly. Left base atelectasis crash that left base atelectasis has increased. IMPRESSION: Increasing interstitial prominence throughout the lungs, likely worsening interstitial edema. Increasing left base atelectasis. Electronically Signed   By: Rolm Baptise M.D.   On: 08/24/2016 08:41   Dg Chest Portable 1 View  Result Date: 08/23/2016 CLINICAL DATA:  Hypoxia EXAM: PORTABLE CHEST 1 VIEW COMPARISON:  Study obtained earlier in the day FINDINGS: Endotracheal tube tip is 2.6 cm above the carina. Central catheter placed from the right side has its tip in the right atrium slightly beyond the cavoatrial junction. Swan-Ganz catheter tip is in the proximal right main pulmonary artery. Left jugular catheter tip is in the superior vena cava.  Nasogastric tube is no longer apparent. Temporary pacemaker wires are attached to the right heart. No pneumothorax. There is perihilar and central interstitial edema. No airspace consolidation. Heart is mildly enlarged. There is mild pulmonary venous hypertension. Prosthetic cardiac valve present. No adenopathy evident. IMPRESSION: Tube and catheter positions as described without pneumothorax. Note that the new catheter placed on the right side has its tip in the right atrium slightly beyond the cavoatrial junction. There is interstitial edema with mild cardiomegaly and mild pulmonary venous hypertension. No airspace consolidation appreciable. Electronically Signed   By: Lowella Grip III M.D.   On: 08/23/2016 15:34   Dg Chest Port 1 View  Result Date: 08/23/2016 CLINICAL DATA:  Respiratory failure. Acute on chronic diastolic heart failure. Cardiogenic shock. EXAM: PORTABLE CHEST 1 VIEW COMPARISON:  08/22/2016, 08/21/2016 and 08/20/2016 and 08/15/2016 FINDINGS: Endotracheal tube, NG tube and right and left jugular vein central catheters appear in good position, unchanged. Intra aortic balloon pump in place. Heart size and pulmonary vascularity are now normal. Small bilateral pleural effusions. Slight haziness at the lung bases is most likely secondary to the effusions although I think there is an element of slight residual pulmonary edema as well. IMPRESSION: Improving pulmonary edema.  Persistent small bilateral effusions. Electronically Signed   By: Lorriane Shire M.D.   On: 08/23/2016 07:44   Dg Chest Port 1 View  Result Date: 08/22/2016 CLINICAL DATA:  Heart failure.  Balloon pump advanced. EXAM: PORTABLE CHEST 1 VIEW COMPARISON:  08/22/2016 at 4:50 a.m. FINDINGS: Metallic tip of the balloon pump projects over the aortic knob, well positioned. Endotracheal tube, right and left internal jugular central venous lines, femoral a placed Swan-Ganz catheter and nasal/orogastric tube are stable in well  positioned. Lung aeration appears mildly improved although the difference may be technical only. There still vascular congestion and interstitial and hazy airspace opacity consistent with asymmetric pulmonary edema. Small pleural effusions are noted. IMPRESSION: 1. Intra-aortic balloon pump tip projects over the aortic knob, well positioned. 2. Possible mild improvement in lung aeration since the earlier study although the difference may be technical only. No other evidence of a change. Electronically Signed   By: Lajean Manes M.D.   On: 08/22/2016 16:34   Dg Chest Port 1 View  Result Date: 08/22/2016 CLINICAL DATA:  Respiratory failure, shortness of Breath EXAM: PORTABLE CHEST 1 VIEW COMPARISON:  08/21/2016 FINDINGS: Support devices are stable. Cardiomegaly. Bilateral perihilar and lower lobe opacities throughout the lungs have increased slightly since prior study, likely mild edema. Suspect small layering effusions. IMPRESSION: Mild interval worsening of pulmonary edema/ CHF. Small layering effusions. Electronically Signed   By: Rolm Baptise M.D.   On:  08/22/2016 07:30   Dg Chest Port 1 View  Result Date: 08/21/2016 CLINICAL DATA:  Acute hypoxemic respiratory failure EXAM: PORTABLE CHEST 1 VIEW COMPARISON:  08/20/2016 FINDINGS: Support devices are stable. Endotracheal tube remains at the level of the carina. Borderline heart size. Bilateral airspace opacities are again noted, likely edema. Findings stable or slightly worsened since prior study. Possible small right effusion. IMPRESSION: Slight interval worsening in pulmonary edema pattern. Small right effusion. Endotracheal tube remains at the level of the carina and could be retracted 2-3 cm for optimal positioning. Electronically Signed   By: Rolm Baptise M.D.   On: 08/21/2016 07:40   Dg Chest Port 1 View  Result Date: 08/20/2016 CLINICAL DATA:  Pt was hypotensive with respiratory failure x a few days ago. Hx of DM, and HTN. Pt had a cardiac cath on  08-15-16. EXAM: PORTABLE CHEST 1 VIEW COMPARISON:  08/19/2016 FINDINGS: Endotracheal to appears low at the level of the carina. LEFT and RIGHT central venous line are unchanged. Swan-Ganz catheter from an inferior approach unchanged. There is some improvement aeration lung bases. Mild pulmonary edema remains. No pneumothorax. IMPRESSION: 1. Endotracheal tube appears low at the level the carina. 2. Stable support apparatus. 3. Slight improved aeration lung bases with persistent mild edema. Electronically Signed   By: Suzy Bouchard M.D.   On: 08/20/2016 07:58   Dg Chest Port 1 View  Result Date: 08/19/2016 CLINICAL DATA:  Pulmonary edema EXAM: PORTABLE CHEST 1 VIEW COMPARISON:  08/18/2016 FINDINGS: External pad artifact. Endotracheal tube, central venous lines, and NG tube unchanged. Swan-Ganz catheter and intra aortic balloon pump unchanged. Stable cardiac silhouette. Mild central venous pulmonary congestion. No overt pulmonary edema. No pneumothorax. IMPRESSION: 1. Stable support apparatus. 2. Central venous congestion.  No significant change. Electronically Signed   By: Suzy Bouchard M.D.   On: 08/19/2016 07:55   Dg Chest Port 1 View  Result Date: 08/18/2016 CLINICAL DATA:  Placement of femoral Swan-Ganz catheter and intra-aortic balloon pump catheter. EXAM: PORTABLE CHEST 1 VIEW 4:14 p.m.: COMPARISON:  Portable chest x-ray earlier today 3:37 p.m. and previously. FINDINGS: Femoral Swan-Ganz catheter tip projects at the expected location of the right middle lobe pulmonary artery. Intra-aortic balloon pump catheter tip projects over the proximal descending thoracic aorta. Endotracheal tube tip now projects approximately 2-3 cm above the carina. Right jugular central venous catheter tip projects over the upper SVC. Left jugular central venous catheter tip projects over the lower SVC. Nasogastric tube courses below the diaphragm into the stomach. Improved aeration in the left lower lobe since the most  recent examination 45 minutes earlier, though streaky opacities persist. Improved perihilar airspace pulmonary edema in both lungs. No new pulmonary parenchymal abnormalities. IMPRESSION: 1. Swan-Ganz catheter tip projects at the expected location of the right middle lobe pulmonary artery. 2. Intra-aortic balloon pump catheter tip projects over the proximal descending thoracic aorta. 3. Remaining support apparatus satisfactory. 4. Improved aeration in both lungs since the examination performed 45 minutes ago, although streaky left lower lobe atelectasis and/or pneumonia and mild perihilar airspace pulmonary edema persists. 5. No new abnormalities. Electronically Signed   By: Evangeline Dakin M.D.   On: 08/18/2016 16:43   Dg Chest Port 1 View  Result Date: 08/18/2016 CLINICAL DATA:  ST-elevation myocardial infarction and cardiogenic shock. EXAM: PORTABLE CHEST 1 VIEW COMPARISON:  Yesterday FINDINGS: Endotracheal tube tip 1 cm above the carina. Central line on the right and dialysis catheter on the left with tips at the lower SVC. Orogastric  tube reaches the stomach at least. Stable borderline heart size. Worsening aeration at the left base with newly obscured diaphragm. Perihilar airspace disease. IMPRESSION: 1. Lower endotracheal tube with tip 1 cm above the carina. 2. New atelectasis or consolidation in the left lower lobe with small effusion. 3. Pulmonary edema. Electronically Signed   By: Monte Fantasia M.D.   On: 08/18/2016 07:02   Dg Chest Port 1 View  Result Date: 08/17/2016 CLINICAL DATA:  Central line placement EXAM: PORTABLE CHEST 1 VIEW COMPARISON:  Earlier same day FINDINGS: Endotracheal tube is been withdrawn with the tip 2.5 cm proximal to the carina. Right internal jugular central line remains in place with its tip in the SVC above the right atrium. New dual-lumen left internal jugular central line has its tip in the SVC above the right atrium. No pneumothorax. Bilateral bat wing airspace  filling is seen consistent with acute edema or acute airspace filling due to other etiologies. No effusions. No collapse. IMPRESSION: Left IJ central line well position with tip in the SVC above the right atrium. No pneumothorax. Endotracheal tube repositioned, well positioned with tip 2.5 cm above the carina. Persistent worsen bat wing edema or airspace filling pattern. Electronically Signed   By: Nelson Chimes M.D.   On: 08/17/2016 10:35   Dg Chest Port 1 View  Result Date: 08/15/2016 CLINICAL DATA:  Central line placement EXAM: PORTABLE CHEST 1 VIEW COMPARISON:  August 15, 2016 7:04 p.m. FINDINGS: Right central venous line is identified with distal tip in superior vena cava. There is no pneumothorax. The heart size mildly enlarged. Diffuse airspace opacities identified throughout both lungs. The bones are unchanged. IMPRESSION: Right central venous line distal tip in the superior vena cava. There is no pneumothorax. Pulmonary edema unchanged compared prior exam. Electronically Signed   By: Abelardo Diesel M.D.   On: 08/15/2016 21:10   Dg Chest Port 1v Same Day  Result Date: 08/27/2016 CLINICAL DATA:  Post MITRAL VALVE REPAIR (MVR), INTRAOPERATIVE TRANSESOPHAGEAL ECHOCARDIOGRAM, ENDOVEIN HARVEST OF GREATER SAPHENOUS VEIN, CENTRAL LINE INSERTION on 08/23/2016. H/O diabetes, hypertension. Pt is intubated and on 24/7 dialysis. Non smoker EXAM: PORTABLE CHEST 1 VIEW COMPARISON:  08/26/2016 FINDINGS: right IJ Swan-Ganz catheter unchanged. There is also a left-sided internal jugular dialysis catheter which terminates at the mid SVC. Endotracheal tube terminates 3.3 cm above carina.Nasogastric and feeding tubes extend beyond the inferior aspect of the film. Mediastinal drain and right-sided chest tube. Cardiomegaly accentuated by AP portable technique. No pleural effusion or pneumothorax. Persistent mild to moderate interstitial edema. Bibasilar airspace disease. Right-sided subclavian line is also unchanged.  IMPRESSION: No significant change since the prior exam. Cardiomegaly with mild to moderate interstitial edema and bibasilar airspace disease. Stable support apparatus, without pneumothorax. Electronically Signed   By: [name] Miyamoto M.D.   On: 08/27/2016 09:06   Dg Abd Portable 1v  Result Date: 08/29/2016 CLINICAL DATA:  Status post feeding tube placement EXAM: PORTABLE ABDOMEN - 1 VIEW COMPARISON:  Supine abdominal radiograph of August 25, 2016 FINDINGS: The tip of the feeding tube projects to the left of midline. This may have read curved upon itself and lie in the gastric body but may also lie in the fourth portion of the duodenum or at the proximal portion of the jejunum. The bowel gas pattern is unremarkable. IMPRESSION: The feeding tube tip lies in likely in the distal duodenum just proximal to the jejunum but the possibility of it having re- curved upon itself and still lie in the  gastric body is raised. A cross-table lateral radiograph may be useful. Electronically Signed   By: David  Martinique M.D.   On: 08/29/2016 12:34   Dg Abd Portable 1v  Result Date: 08/25/2016 CLINICAL DATA:  Feeding tube placement. EXAM: PORTABLE ABDOMEN - 1 VIEW COMPARISON:  08/22/2016. FINDINGS: Feeding tube noted with its tip projected over the distal stomach/ proximal duodenum. NG tube noted in stable position with tip over the stomach. Left femoral catheter noted. Right femoral sheath in stable position . Aortic balloon pump tip noted L3. No bowel distention. IMPRESSION: Interim placement of feeding tube, its tip is projected over the distal stomach/ duodenum. Remaining lines and tubes including aortic balloon pump in stable position . Electronically Signed   By: Marcello Moores  Register   On: 08/25/2016 12:50   Dg Abd Portable 1v  Result Date: 08/22/2016 CLINICAL DATA:  Balloon pump position EXAM: PORTABLE ABDOMEN - 1 VIEW COMPARISON:  Chest x-ray 08/22/2016.  KUB 08/17/2016 FINDINGS: Right femoral Swan-Ganz catheter extends  above the diaphragm . Intra-aortic balloon pump marker appears overlying the L3 vertebral body in previously was in the thoracic aorta. Left femoral venous catheter in the left iliac vein. NG tube in the stomach. Normal bowel gas pattern. IMPRESSION: Intra-aortic balloon pump marker  overlies the L3 vertebral body. Electronically Signed   By: Franchot Gallo M.D.   On: 08/22/2016 13:12   Dg Abd Portable 1v  Result Date: 08/19/2016 CLINICAL DATA:  Acidosis EXAM: PORTABLE ABDOMEN - 1 VIEW COMPARISON:  08/17/2016 FINDINGS: No dilated loops large or small bowel. NG tube in stomach. Swan-Ganz catheter noted LEFT basilar atelectasis. The kidneys are dense suggesting retained IV contrast . IMPRESSION: 1. Normal bowel-gas pattern. 2. Retained contrast in the kidneys consistent consistent with acute kidney injury. Electronically Signed   By: Suzy Bouchard M.D.   On: 08/19/2016 08:02   US Abdomen Limited Ruq  Result Date: 08/26/2016 CLINICAL DATA:  Acute onset of abnormal LFTs.  Initial encounter. EXAM: US ABDOMEN LIMITED - RIGHT UPPER QUADRANT COMPARISON:  None. FINDINGS: Gallbladder: Gallbladder wall thickening is noted, measuring up to 5 mm. Vague internal echoes are seen within the gallbladder, suggestive of underlying sludge. No definite stones are identified. No pericholecystic fluid is seen. Evaluation for ultrasonographic Murphy's sign is not possible, as the patient is sedated and on ventilation. Common bile duct: Diameter: 0.5 cm, within normal limits in caliber. Liver: No focal lesion identified. Within normal limits in parenchymal echogenicity. The left hepatic lobe is not assessed as the patient has overlying bandages, due to recent CABG and mitral valve replacement. A small right-sided pleural effusion is noted. IMPRESSION: 1. Gallbladder wall thickening noted. Suggestion of underlying sludge filling the gallbladder. No definite evidence of cholelithiasis. Findings may reflect mild chronic  inflammation. No pericholecystic fluid is seen to suggest acute cholecystitis. 2. Small right-sided pleural effusion noted. 3. Liver grossly unremarkable in appearance, though the left hepatic lobe is not imaged on this study. Electronically Signed   By: Garald Balding M.D.   On: 08/26/2016 18:29    PHYSICAL EXAM General: Intubated/sedated Neck: RIJ TLC LIJ trialysis  Lungs: Clear anteriorly CV: Nondisplaced PMI.  Heart regular S1/S2, no S3/S4, 3/6 HSM apex.  Abdomen: Soft, ND, no HSM. No bruits or masses. +BS  Neurologic: Sedated.  Not responding to verbal stimuli.  Extremities: No clubbing or cyanosis. 1-2+ edema GU: Foley    TELEMETRY: Reviewed, remains in Sinus Tach.   Assessment:   1. Cardiogenic shock: Due to acute severe MR.  2. CAD: Inferior posterior STEMI on 9/13 with DES x 2 to OM-1      --Plavix restarted on 9/22 3. 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     --s/p MVR 4. AKI on CKD stage III - due to ATN likely from cardiogenic shock + contrast with cath. Has required CVVH.  5. Acute respiratory failure: Pulmonary edema.  6. DM2 7. Suspect component of septic shock: Possible PNA, group B Strep in sputum culture.  8. Anemia 9. PVCs   10. Shock liver 11. MRSA bactermia   Plan/Discussion:    She remains critically ill with cardiogenic shock and multi-system organ failure in setting of inferior STEMI, severe ischemic MR due to ruptured papillary muscle and AKI. S/P MVR 08/23/16  Off norepi and epi. Remains on milrinone 0.25 mcg/kg/min. Coox 40.9% initially then up to 54.4% on recheck.  Will continue for today.  May need to add back norepi in order to wean.      CCM managing vent.    Does appear to have R-sided weakness. Head CT negative.   Ectopy stabilized. Off lido.    Back on Plavix with recent stent.   Bcx with MRSA. Now on day #4  Vanc/zosyn. Likely can narrow to vanc alone.    Shirley Friar, PA-C 11:49 AM  Advanced Heart Failure Team Pager [phone] (M-F; 7a - 4p)  Please contact [facility] for night-coverage after hours (4p -7a ) and weekends on [email or web address]  Patient seen and examined with Oda Kilts, PA-C. We discussed all aspects of the encounter. I agree with the assessment and plan as stated above.   She remains critically ill but improving slowly. Now off norepi and epi. Continues on milrinone. Co-ox marginal.    Volume status much improved on CVVHD. Having tunneled HD cath placed today.   Has MRSA bacteremia. Will discuss abx scope and  duration with ID.  Ectopy stable off lido.   Hopefully can extubate later today or tomorrow. R-side still appears weak.   Critical Care Time devoted to patient care services described in this note is 35 Minutes.  Kweku Stankey,MD 6:04 PM

## 2016-08-31 NOTE — Plan of Care (Addendum)
Problem: Bowel/Gastric: Goal: Gastrointestinal status for postoperative course will improve Outcome: Not Progressing No documented bowel movement since 9/23. Milk of magnesia given with passing of small bloody clot and small amount of stool.

## 2016-08-31 NOTE — Progress Notes (Signed)
PULMONARY / CRITICAL CARE MEDICINE   Name: Deborah [name] MRN: [id number] DOB: [birth year]    ADMISSION DATE:  08/18/2016 CONSULTATION DATE:  08/18/2016  REFERRING MD:  Bensimhon   CHIEF COMPLAINT:  Ventilator management and critical care services   HISTORY OF PRESENT ILLNESS:   60 yo female admitted to [facility] 08/15/16 with STEMI s/p cath to [place].  Developed cardiogenic shock, VDRF (ETT 9/14), AKI with metabolic acidosis and started on CRRT 08/17/16.  Transferred to [facility] to tx shock and evaluated new severe MR from papillary muscle rupture.  She also had fever and found to have Group B Strep in sputum culture. Mitral valve replacement 9/20.  SUBJECTIVE:  For permcath today Weaning  VITAL SIGNS: BP 125/69   Pulse 84   Temp 97 F (36.1 C) (Axillary)   Resp 18   Ht 5\' 1"  (1.549 m) Comment: from 08/15/16 encounter  Wt 156 lb 8.4 oz (71 kg)   SpO2 100%   BMI 29.58 kg/m   HEMODYNAMICS: CVP:  [10 mmHg-41 mmHg] 12 mmHg  VENTILATOR SETTINGS: Vent Mode: CPAP;PSV FiO2 (%):  [40 %] 40 % Set Rate:  [16 bmp] 16 bmp Vt Set:  [400 mL] 400 mL PEEP:  [5 cmH20] 5 cmH20 Pressure Support:  [5 cmH20] 5 cmH20 Plateau Pressure:  [18 cmH20-23 cmH20] 21 cmH20  INTAKE / OUTPUT: I/O last 3 completed shifts: In: 4455.7 [I.V.:1454.7; NG/GT:1350; IV Piggyback:1651] Out: 6256 [Emesis/NG output:150; LSLHT:3428]  PHYSICAL EXAMINATION: General: agitated on vent HENT: NCAT ETT in place PULM: few rhonchi bilaterally, vent supported breaths CV: RRR, systolic murmur noted GI: BS+, soft, nontender MSK: normal bulk and tone Neuro: agitated, not following comaands  LABS:  BMET  Recent Labs Lab 08/30/16 0324 08/30/16 1546 08/31/16 0336  NA 136 137 137  135  K 4.1 3.7 4.1  4.0  CL 102 101 105  102  CO2 25 24 25  25   BUN 21* 20 19  19   CREATININE 1.49* 1.34* 1.30*  1.34*  GLUCOSE 179* 154* 169*  169*    Electrolytes  Recent Labs Lab 08/29/16 0431  08/30/16 0324 08/30/16 1546  08/31/16 0336  CALCIUM 7.7*  < > 7.9* 8.1* 8.2*  8.2*  MG 2.3  --  2.2  --  2.5*  PHOS  --   < > 1.9* 2.9 2.1*  < > = values in this interval not displayed.  CBC  Recent Labs Lab 08/29/16 1900 08/30/16 0324 08/31/16 0336  WBC 29.2* 21.2* 20.5*  HGB 9.0* 8.6* 8.6*  HCT 26.8* 26.0* 26.5*  PLT 143* 152 157    Coag's  Recent Labs Lab 08/27/16 0321 08/28/16 0429 08/29/16 0431  APTT 123* 156* 194*    Sepsis Markers  Recent Labs Lab 08/28/16 0909 08/29/16 0431 08/30/16 0324  PROCALCITON 6.74 4.85 4.90    ABG  Recent Labs Lab 08/29/16 0408 08/30/16 0330 08/31/16 0344  PHART 7.487* 7.459* 7.453*  PCO2ART 34.0 35.7 36.4  PO2ART 105.0 112.0* 100.0    Liver Enzymes  Recent Labs Lab 08/29/16 0431  08/30/16 0324 08/30/16 1546 08/31/16 0336  AST 139*  --  55*  --  37  ALT 291*  --  179*  --  123*  ALKPHOS 184*  --  146*  --  142*  BILITOT 0.9  --  1.1  --  1.1  ALBUMIN 1.8*  < > 2.2* 2.8* 3.1*  3.1*  < > = values in this interval not displayed.  Cardiac Enzymes No results for input(s):  TROPONINI, PROBNP in the last 168 hours.  Glucose  Recent Labs Lab 08/30/16 0758 08/30/16 1201 08/30/16 1526 08/30/16 1923 08/30/16 2340 08/31/16 0353  GLUCAP 160* 240* 167* 107* 183* 171*    Imaging Dg Chest Port 1 View  Result Date: 08/31/2016 CLINICAL DATA:  Mitral valve replacement. EXAM: PORTABLE CHEST 1 VIEW COMPARISON:  08/30/2016 FINDINGS: Endotracheal tube, NG tube, feeding tube, dual lumen left IJ catheter, right IJ catheter in stable position. Prior cardiac valve replacement. Cardiomegaly with bilateral pulmonary infiltrates consistent pulmonary edema again noted. Small right pleural effusion. IMPRESSION: 1. Lines and tubes in stable position. 2. Prior cardiac valve replacement. Cardiomegaly with bilateral pulmonary infiltrates consistent with pulmonary edema again noted. Small right pleural effusion. No significant interim change. Electronically  Signed   By: Marcello Moores  Register   On: 08/31/2016 07:39    STUDIES:  [place] 9/12: PCI to OM1, severe MR, EF 55 to 60% RHC 9/15: RA 5, RV 37/3/6, PA 38/18/26, PCWP 15, CI 1.9, PVR 3.3 WU TEE 9/15: EF 60 to 65%, flail motion MV with severe MR Port CXR 9/21:  Tubes in lines in good position. Hazy bilateral opacities. Port CXR 9/23: Lines, tubes, and endotracheal tube in good position. Persistent pulmonary vascular congestion and effusions RUQ ultrasound 9/23 > Gallbladder wall thickening noted, suggestion of sludge, small R effusion, liver unremarkable but left lobe not well visulalized  MICROBIOLOGY: MRSA PCR 9/12:  Negative MRSA PCR 9/15:  Negative Tracheal Asp Ctx 9/15:  Strep agalactiae  Blood Ctx x2 9/15:  Negative  MRSA PCR 9/19:  Negative  Blood culture 9/24: coag neg staph 2/4  ANTIBIOTICS: Zosyn 9/16 - 9/17 Vancomycin 9/16 - 9/17 Rocephin 9/17 >> 9/24 Vancomycin 9/20 (surgical prophylaxis) Vanc 9/25 > Zosyn 9/25 >  Fluconazole 9/25 >   SIGNIFICANT EVENTS: 9/12 Admit to [facility], PCI to [place] 9/14 ETT, CRRT 9/15 to [facility] 9/16 off insulin gtt 9/20 To OR for Mitral Valve Repair w/ bioprosthetic valve 9/28 To OR for permcath  LINES/TUBES: Right IJ PICC 9/12 - 9/20 Rt femoral introducer 9/15 - 9/20 OETT 8.0 9/14 >>  HD cath left IJ 9/14 >> R IJ Introducer/CVL/Swan 9/20 >>  9/25 IABP 9/15 >>9/22 Lt femoral a line 9/15 >> 9/25 Y Chest Tube/Mediastinal Chest Tube 9/20 >> PIV x1 L brachial art line 9/25 >  ASSESSMENT / PLAN:  PULMONARY A: Acute Hypoxic Respiratory Failure - Secondary to acute pulmonary edema P:   Will try to extubate after OR Continue PSV for now VAP prevention  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 Shock - Combination cardiogenic & septic  P:  Continuous Telemetry Monitoring Post-op Mgt per CVTS/Cardiology ASA, Plavix per cardiology/CVTS  Amio, Milrinone gtt's per cardiology/CVTS  RENAL A:    Acute Renal Failure - Baseline creatinine 1.6-1.7  P:   Nephrology Following Continue volume removal per CVVHD > permcath today Monitor BMET and UOP Replace electrolytes as needed  GASTROINTESTINAL A:   Transaminitis - near complete resolution, improving, amio or statin effect Mild Malnutrition - Related to critical illness New onset a fib P:   Pepcid IV q12hr Cont TF  HEMATOLOGIC A:   Anemia - Last transfusion 9/20 Leukocytosis - Multifactorial  P:  Trending cell counts w/ CBC SCDs  INFECTIOUS A:   Group B Strep Pneumonia Coag negative staph bacteremia (likely contaminant) P:   HD cath out today Will change CVL post permcath today Vanc and zosyn restarted on 9/25 and Fluconazole started on 9/25 >  consider stopping Re-culture for fever  ENDOCRINE A:   DM - Glucose controlled  P:   Subcutaneous insulin per CVTS Levemir BID  NEUROLOGIC A:   New onset R sided hemiplegia and Preferental gaze to Right (9/25). Cranial CT scan was (-) for acute changes.  Acute Encephalopathy - Multifactorial and likely combination toxic metabolic & hypoxia  P:   Observe neuro status. MRI when stable  RASS goal: -1 Precedex gtt  Off versed IV PRN Fentanyl IV prn  No family at bedside Critical care time: 34 minutes.   Roselie Awkward, MD [place] [facility] Pager: [phone] Cell: [phone] After 3pm or if no response, call [phone]  08/31/2016, 8:03 AM

## 2016-09-01 ENCOUNTER — Inpatient Hospital Stay (HOSPITAL_COMMUNITY): Payer: Medicaid Other

## 2016-09-01 LAB — RENAL FUNCTION PANEL
ALBUMIN: 2.8 g/dL — AB (ref 3.5–5.0)
Anion gap: 11 (ref 5–15)
BUN: 22 mg/dL — AB (ref 6–20)
CO2: 22 mmol/L (ref 22–32)
CREATININE: 1.43 mg/dL — AB (ref 0.44–1.00)
Calcium: 8.4 mg/dL — ABNORMAL LOW (ref 8.9–10.3)
Chloride: 104 mmol/L (ref 101–111)
GFR calc Af Amer: 45 mL/min — ABNORMAL LOW (ref >60)
GFR, EST NON AFRICAN AMERICAN: 39 mL/min — AB (ref >60)
GLUCOSE: 101 mg/dL — AB (ref 65–99)
PHOSPHORUS: 2.1 mg/dL — AB (ref 2.5–4.6)
Potassium: 3.4 mmol/L — ABNORMAL LOW (ref 3.5–5.1)
Sodium: 137 mmol/L (ref 135–145)

## 2016-09-01 LAB — GLUCOSE, CAPILLARY
GLUCOSE-CAPILLARY: 147 mg/dL — AB (ref 65–99)
GLUCOSE-CAPILLARY: 218 mg/dL — AB (ref 65–99)
GLUCOSE-CAPILLARY: 102 mg/dL — AB (ref 65–99)
GLUCOSE-CAPILLARY: 103 mg/dL — AB (ref 65–99)
Glucose-Capillary: 134 mg/dL — ABNORMAL HIGH (ref 65–99)
Glucose-Capillary: 71 mg/dL (ref 65–99)

## 2016-09-01 LAB — COMPREHENSIVE METABOLIC PANEL
ALBUMIN: 2.6 g/dL — AB (ref 3.5–5.0)
ALK PHOS: 143 U/L — AB (ref 38–126)
ALT: 91 U/L — ABNORMAL HIGH (ref 14–54)
ANION GAP: 8 (ref 5–15)
AST: 37 U/L (ref 15–41)
BUN: 21 mg/dL — ABNORMAL HIGH (ref 6–20)
CALCIUM: 7.7 mg/dL — AB (ref 8.9–10.3)
CO2: 23 mmol/L (ref 22–32)
Chloride: 106 mmol/L (ref 101–111)
Creatinine, Ser: 1.47 mg/dL — ABNORMAL HIGH (ref 0.44–1.00)
GFR calc Af Amer: 44 mL/min — ABNORMAL LOW (ref >60)
GFR calc non Af Amer: 38 mL/min — ABNORMAL LOW (ref >60)
GLUCOSE: 164 mg/dL — AB (ref 65–99)
Potassium: 3.6 mmol/L (ref 3.5–5.1)
SODIUM: 137 mmol/L (ref 135–145)
Total Bilirubin: 1.2 mg/dL (ref 0.3–1.2)
Total Protein: 5.8 g/dL — ABNORMAL LOW (ref 6.5–8.1)

## 2016-09-01 LAB — BLOOD GAS, ARTERIAL
Acid-Base Excess: 0.2 mmol/L (ref 0.0–2.0)
Bicarbonate: 23.5 mmol/L (ref 20.0–28.0)
DRAWN BY: 419771
FIO2: 40
LHR: 16 {breaths}/min
MECHVT: 400 mL
O2 SAT: 98.4 %
PATIENT TEMPERATURE: 98.6
PCO2 ART: 33.1 mmHg (ref 32.0–48.0)
PEEP: 5 cmH2O
PO2 ART: 133 mmHg — AB (ref 83.0–108.0)
pH, Arterial: 7.465 — ABNORMAL HIGH (ref 7.350–7.450)

## 2016-09-01 LAB — CULTURE, BLOOD (ROUTINE X 2): Culture: NO GROWTH

## 2016-09-01 LAB — CBC
HCT: 25 % — ABNORMAL LOW (ref 36.0–46.0)
HEMOGLOBIN: 8.1 g/dL — AB (ref 12.0–15.0)
MCH: 29 pg (ref 26.0–34.0)
MCHC: 32.4 g/dL (ref 30.0–36.0)
MCV: 89.6 fL (ref 78.0–100.0)
Platelets: 189 10*3/uL (ref 150–400)
RBC: 2.79 MIL/uL — AB (ref 3.87–5.11)
RDW: 16.3 % — ABNORMAL HIGH (ref 11.5–15.5)
WBC: 20.2 10*3/uL — AB (ref 4.0–10.5)

## 2016-09-01 LAB — POCT ACTIVATED CLOTTING TIME
ACTIVATED CLOTTING TIME: 202 s
ACTIVATED CLOTTING TIME: 219 s
ACTIVATED CLOTTING TIME: 230 s
Activated Clotting Time: 219 seconds
Activated Clotting Time: 241 seconds

## 2016-09-01 LAB — CARBOXYHEMOGLOBIN
Carboxyhemoglobin: 1.1 % (ref 0.5–1.5)
Carboxyhemoglobin: 1.1 % (ref 0.5–1.5)
Methemoglobin: 1.2 % (ref 0.0–1.5)
Methemoglobin: 1 % (ref 0.0–1.5)
O2 SAT: 44.3 %
O2 Saturation: 44.1 %
TOTAL HEMOGLOBIN: 7.6 g/dL — AB (ref 12.0–16.0)
Total hemoglobin: 9.3 g/dL — ABNORMAL LOW (ref 12.0–16.0)

## 2016-09-01 LAB — POCT I-STAT, CHEM 8
BUN: 23 mg/dL — AB (ref 6–20)
CHLORIDE: 103 mmol/L (ref 101–111)
Calcium, Ion: 1.09 mmol/L — ABNORMAL LOW (ref 1.15–1.40)
Creatinine, Ser: 1.2 mg/dL — ABNORMAL HIGH (ref 0.44–1.00)
Glucose, Bld: 98 mg/dL (ref 65–99)
HCT: 29 % — ABNORMAL LOW (ref 36.0–46.0)
Hemoglobin: 9.9 g/dL — ABNORMAL LOW (ref 12.0–15.0)
POTASSIUM: 3.4 mmol/L — AB (ref 3.5–5.1)
SODIUM: 140 mmol/L (ref 135–145)
TCO2: 22 mmol/L (ref 0–100)

## 2016-09-01 LAB — OCCULT BLOOD X 1 CARD TO LAB, STOOL: Fecal Occult Bld: POSITIVE — AB

## 2016-09-01 LAB — C DIFFICILE QUICK SCREEN W PCR REFLEX
C DIFFICILE (CDIFF) INTERP: NOT DETECTED
C DIFFICILE (CDIFF) TOXIN: NEGATIVE
C DIFFICLE (CDIFF) ANTIGEN: NEGATIVE

## 2016-09-01 LAB — MAGNESIUM: MAGNESIUM: 2.2 mg/dL (ref 1.7–2.4)

## 2016-09-01 MED ORDER — ATORVASTATIN CALCIUM 20 MG PO TABS
20.0000 mg | ORAL_TABLET | Freq: Every day | ORAL | Status: DC
  Administered 2016-09-01 – 2016-10-10 (×39): 20 mg via ORAL
  Filled 2016-09-01 (×38): qty 1

## 2016-09-01 MED ORDER — PRO-STAT SUGAR FREE PO LIQD
30.0000 mL | Freq: Three times a day (TID) | ORAL | Status: DC
  Administered 2016-09-01 – 2016-09-06 (×13): 30 mL
  Filled 2016-09-01 (×12): qty 30

## 2016-09-01 MED ORDER — CHLORHEXIDINE GLUCONATE 0.12 % MT SOLN
15.0000 mL | Freq: Two times a day (BID) | OROMUCOSAL | Status: DC
  Administered 2016-09-01 – 2016-09-03 (×5): 15 mL via OROMUCOSAL
  Filled 2016-09-01 (×3): qty 15

## 2016-09-01 MED ORDER — ORAL CARE MOUTH RINSE
15.0000 mL | Freq: Two times a day (BID) | OROMUCOSAL | Status: DC
  Administered 2016-09-01 – 2016-09-03 (×6): 15 mL via OROMUCOSAL

## 2016-09-01 MED ORDER — FENTANYL CITRATE (PF) 100 MCG/2ML IJ SOLN
12.5000 ug | INTRAMUSCULAR | Status: DC | PRN
  Administered 2016-09-01: 25 ug via INTRAVENOUS
  Filled 2016-09-01: qty 2

## 2016-09-01 MED ORDER — NEPRO/CARBSTEADY PO LIQD
1000.0000 mL | ORAL | Status: DC
  Administered 2016-09-01 – 2016-09-03 (×4): 1000 mL
  Filled 2016-09-01 (×5): qty 1000

## 2016-09-01 NOTE — Progress Notes (Signed)
Subjective: Interval History: awake. Mouths words  Objective: Vital signs in last 24 hours: Temp:  [96.1 F (35.6 C)-99.3 F (37.4 C)] 97.4 F (36.3 C) (09/29 0407) Pulse Rate:  [81-114] 95 (09/29 0600) Resp:  [14-29] 20 (09/29 0600) BP: (76-167)/(52-89) 102/69 (09/29 0600) SpO2:  [95 %-100 %] 100 % (09/29 0600) Arterial Line BP: (68-190)/(45-91) 89/54 (09/29 0600) FiO2 (%):  [40 %] 40 % (09/29 0400) Weight:  [71.4 kg (157 lb 6.5 oz)] 71.4 kg (157 lb 6.5 oz) (09/29 0500) Weight change: 0.4 kg (14.1 oz)  Intake/Output from previous day: 09/28 0701 - 09/29 0700 In: 2962.2 [I.V.:1644.2; NG/GT:560; IV Piggyback:758] Out: 4827 [Emesis/NG output:100; Blood:10] Intake/Output this shift: Total I/O In: 1253.1 [I.V.:583.1; NG/GT:520; IV Piggyback:150] Out: 0786 [Emesis/NG output:100; LJQGB:2010]  General appearance: cooperative, mildly obese, pale and squeezes hands, L>R Resp: diminished breath sounds bilaterally and rales bibasilar Chest wall: L IJ PC Cardio: S1, S2 normal and systolic murmur: holosystolic 2/6, blowing at apex GI: pos bs, soft, liver down 4 cm Extremities: edema 2-3+  Lab Results:  Recent Labs  08/31/16 0336 08/31/16 1702 09/01/16 0500  WBC 20.5*  --  20.2*  HGB 8.6* 9.2* 8.1*  HCT 26.5* 27.0* 25.0*  PLT 157  --  189   BMET:  Recent Labs  08/31/16 1657 08/31/16 1702 09/01/16 0500  NA 135 139 137  K 4.1 4.0 3.6  CL 103  --  106  CO2 20*  --  23  GLUCOSE 171* 168* 164*  BUN 22*  --  21*  CREATININE 1.70*  --  1.47*  CALCIUM 7.8*  --  7.7*   No results for input(s): PTH in the last 72 hours. Iron Studies: No results for input(s): IRON, TIBC, TRANSFERRIN, FERRITIN in the last 72 hours.  Studies/Results: Dg Chest Port 1 View  Result Date: 08/31/2016 CLINICAL DATA:  Status post dialysis catheter insertion. EXAM: PORTABLE CHEST 1 VIEW COMPARISON:  08/23/2016 FINDINGS: There has been interval placement of dual lumen large bore dialysis catheter from  left internal jugular approach. The catheter terminates in the expected location of the right atrium. The rest of the of the support apparatus and postsurgical changes are stable. The cardiac silhouette is mildly enlarged. Mediastinal contours appear intact. There is no evidence of pneumothorax. Mild pulmonary edema. Bilateral small pleural effusions cannot be excluded. Osseous structures are without acute abnormality. Soft tissues are grossly normal. IMPRESSION: Post insertion of large bore dual lumen central venous catheter with tip terminating in the expected location of the right atrium. Support apparatus otherwise stable. Mild pulmonary edema.  Possible bilateral small pleural effusions. Electronically Signed   By: Fidela Salisbury M.D.   On: 08/31/2016 17:31   Dg Chest Port 1 View  Result Date: 08/31/2016 CLINICAL DATA:  Mitral valve replacement. EXAM: PORTABLE CHEST 1 VIEW COMPARISON:  08/30/2016 FINDINGS: Endotracheal tube, NG tube, feeding tube, dual lumen left IJ catheter, right IJ catheter in stable position. Prior cardiac valve replacement. Cardiomegaly with bilateral pulmonary infiltrates consistent pulmonary edema again noted. Small right pleural effusion. IMPRESSION: 1. Lines and tubes in stable position. 2. Prior cardiac valve replacement. Cardiomegaly with bilateral pulmonary infiltrates consistent with pulmonary edema again noted. Small right pleural effusion. No significant interim change. Electronically Signed   By: Marcello Moores  Register   On: 08/31/2016 07:39   Dg Fluoro Guide Cv Line-no Report  Result Date: 08/31/2016 CLINICAL DATA:  FLOURO GUIDE CV LINE Fluoroscopy was utilized by the requesting physician.  No radiographic interpretation.  I have reviewed the patient's current medications.  Assessment/Plan: 1 AKI oliguric ATN, vol xs.  Good acid/base/K.  Will cont slow vol off.  BP volatile 2 Resp failure per CCM hopefully wean 3 MRSA sepsis in setting of valve repair 4 DM  controlled 5 Anemia Hb a little lower follow 6 Nutrition TF 7 CAD 8 Afib 9 R sided weakness P CRRT,clearance and vol, AB, TF, vent, control rhythm, follow HB    LOS: 14 days   Vidur Knust L 09/01/2016,6:55 AM

## 2016-09-01 NOTE — Progress Notes (Signed)
PT Cancellation Note  Patient Details Name: Deborah [name] MRN: [id number] DOB: 1956/03/20   Cancelled Treatment:    Reason Eval/Treat Not Completed: Medical issues which prohibited therapy (pt just extubated and not yet 3 hours post. )   [name] 09/01/2016, 11:07 AM Elwyn Reach, [place]

## 2016-09-01 NOTE — Progress Notes (Addendum)
Nutrition Consult/Follow Up  DOCUMENTATION CODES:   Not applicable  INTERVENTION:    Re-start Nepro formula at goal rate of 35 ml/h (840 ml per day) and Prostat 30 ml TID to provide 1812 kcals, 113 gm protein, 611 ml free water daily  NUTRITION DIAGNOSIS:   Inadequate oral intake related to inability to eat as evidenced by NPO status; ongoing  GOAL:   Patient will meet greater than or equal to 90% of their needs; progressing  MONITOR:   TF tolerance, Labs, Weight trends, Skin, I & O's  ASSESSMENT:   60 y/o woman with DM2, HTN, HL and noncompliance presented to [facility] ER with infrerior posterior STEMI on 9/12. Taken to cath lab and found to have occluded small to moderate OM-1 branch. Underwent successful PCS with stent x 2 by Dr. Clayborn Bigness. Coronaries otherwise ok. EF 55-60% with severe MR confirmed by echo.    Pt s/p procedures 9/20: MITRAL VALVE REPAIR (MVR)  CORONARY ARTERY BYPASS GRAFTING (CABG)   Patient extubated today. S/p L IJ tunneled dialysis catheter 9/28 >> continues on CVVHD. New CORTRAK tube in place >> tip at distal duodenum.  Will adjust current TF orders utilizing Nepro formula. Speech Path consulted for swallow evaluation.  CBG (last 3)   Recent Labs  09/01/16 0353 09/01/16 0803 09/01/16 1145  GLUCAP 134* 147* 218*   Diet Order:  Diet NPO time specified  Skin:  Reviewed, no issues  Last BM:  9/29  Height:   Ht Readings from Last 1 Encounters:  08/18/16 5\' 1"  (1.549 m)    Weight:   Wt Readings from Last 1 Encounters:  09/01/16 157 lb 6.5 oz (71.4 kg)  Admit weight (08/18/16) 156 lb (71.2 kg)  Ideal Body Weight:  47.7 kg  BMI:  Body mass index is 29.74 kg/m.  Estimated Nutritional Needs:   Kcal:  1700-1900  Protein:  105-115 gm  Fluid:  per MD  EDUCATION NEEDS:   No education needs identified at this time  Arthur Holms, RD, LDN Pager #: [phone] After-Hours Pager #: [phone]

## 2016-09-01 NOTE — Progress Notes (Signed)
PULMONARY / CRITICAL CARE MEDICINE   Name: Deborah [name] MRN: [id number] DOB: Aug 30, 1956    ADMISSION DATE:  08/18/2016 CONSULTATION DATE:  08/18/2016  REFERRING MD:  Bensimhon   CHIEF COMPLAINT:  Ventilator management and critical care services   HISTORY OF PRESENT ILLNESS:   60 yo female admitted to [facility] 08/15/16 with STEMI s/p cath to [place].  Developed cardiogenic shock, VDRF (ETT 9/14), AKI with metabolic acidosis and started on CRRT 08/17/16.  Transferred to [facility] to tx shock and evaluated new severe MR from papillary muscle rupture.  She also had fever and found to have Group B Strep in sputum culture. Mitral valve replacement 9/20.  SUBJECTIVE:  Permcath yesterday, triple lumen placed yesterday  VITAL SIGNS: BP 122/74   Pulse 98   Temp (!) 96.8 F (36 C) (Axillary)   Resp (!) 27   Ht 5\' 1"  (1.549 m) Comment: from 08/15/16 encounter  Wt 157 lb 6.5 oz (71.4 kg)   SpO2 100%   BMI 29.74 kg/m   HEMODYNAMICS: CVP:  [7 mmHg-14 mmHg] 10 mmHg  VENTILATOR SETTINGS: Vent Mode: PSV FiO2 (%):  [40 %] 40 % Set Rate:  [16 bmp] 16 bmp Vt Set:  [400 mL] 400 mL PEEP:  [5 cmH20] 5 cmH20 Pressure Support:  [5 cmH20] 5 cmH20 Plateau Pressure:  [13 cmH20-19 cmH20] 18 cmH20  INTAKE / OUTPUT: I/O last 3 completed shifts: In: 4306.1 [I.V.:2318.1; NG/GT:880; IV Piggyback:1108] Out: 1517 [Emesis/NG output:150; OHYWV:3710; Blood:10]  PHYSICAL EXAMINATION: General: Awake on vent HENT: NCAT ETT in place PULM: few rhonchi bilaterally, vent supported breaths CV: RRR, systolic murmur noted GI: BS+, soft, nontender MSK: normal bulk and tone Neuro: Follows commands, moves all four extremities on command  LABS:  BMET  Recent Labs Lab 08/31/16 0336 08/31/16 1657 08/31/16 1702 09/01/16 0500  NA 137  135 135 139 137  K 4.1  4.0 4.1 4.0 3.6  CL 105  102 103  --  106  CO2 25  25 20*  --  23  BUN 19  19 22*  --  21*  CREATININE 1.30*  1.34* 1.70*  --  1.47*  GLUCOSE 169*  169* 171*  168* 164*    Electrolytes  Recent Labs Lab 08/30/16 0324 08/30/16 1546 08/31/16 0336 08/31/16 1657 09/01/16 0500  CALCIUM 7.9* 8.1* 8.2*  8.2* 7.8* 7.7*  MG 2.2  --  2.5*  --  2.2  PHOS 1.9* 2.9 2.1* 5.1*  --     CBC  Recent Labs Lab 08/30/16 0324 08/31/16 0336 08/31/16 1702 09/01/16 0500  WBC 21.2* 20.5*  --  20.2*  HGB 8.6* 8.6* 9.2* 8.1*  HCT 26.0* 26.5* 27.0* 25.0*  PLT 152 157  --  189    Coag's  Recent Labs Lab 08/27/16 0321 08/28/16 0429 08/29/16 0431  APTT 123* 156* 194*    Sepsis Markers  Recent Labs Lab 08/28/16 0909 08/29/16 0431 08/30/16 0324  PROCALCITON 6.74 4.85 4.90    ABG  Recent Labs Lab 08/30/16 0330 08/31/16 0344 09/01/16 0517  PHART 7.459* 7.453* 7.465*  PCO2ART 35.7 36.4 33.1  PO2ART 112.0* 100.0 133*    Liver Enzymes  Recent Labs Lab 08/30/16 0324  08/31/16 0336 08/31/16 1657 09/01/16 0500  AST 55*  --  37  --  37  ALT 179*  --  123*  --  91*  ALKPHOS 146*  --  142*  --  143*  BILITOT 1.1  --  1.1  --  1.2  ALBUMIN 2.2*  < >  3.1*  3.1* 3.0* 2.6*  < > = values in this interval not displayed.  Cardiac Enzymes No results for input(s): TROPONINI, PROBNP in the last 168 hours.  Glucose  Recent Labs Lab 08/31/16 0824 08/31/16 1223 08/31/16 1924 08/31/16 2342 09/01/16 0353 09/01/16 0803  GLUCAP 186* 157* 197* 140* 134* 147*    Imaging Dg Chest Port 1 View  Result Date: 08/31/2016 CLINICAL DATA:  Status post dialysis catheter insertion. EXAM: PORTABLE CHEST 1 VIEW COMPARISON:  08/23/2016 FINDINGS: There has been interval placement of dual lumen large bore dialysis catheter from left internal jugular approach. The catheter terminates in the expected location of the right atrium. The rest of the of the support apparatus and postsurgical changes are stable. The cardiac silhouette is mildly enlarged. Mediastinal contours appear intact. There is no evidence of pneumothorax. Mild pulmonary edema. Bilateral  small pleural effusions cannot be excluded. Osseous structures are without acute abnormality. Soft tissues are grossly normal. IMPRESSION: Post insertion of large bore dual lumen central venous catheter with tip terminating in the expected location of the right atrium. Support apparatus otherwise stable. Mild pulmonary edema.  Possible bilateral small pleural effusions. Electronically Signed   By: Fidela Salisbury M.D.   On: 08/31/2016 17:31   Dg Fluoro Guide Cv Line-no Report  Result Date: 08/31/2016 CLINICAL DATA:  FLOURO GUIDE CV LINE Fluoroscopy was utilized by the requesting physician.  No radiographic interpretation.    STUDIES:  LHC 9/12: PCI to OM1, severe MR, EF 55 to 60% RHC 9/15: RA 5, RV 37/3/6, PA 38/18/26, PCWP 15, CI 1.9, PVR 3.3 WU TEE 9/15: EF 60 to 65%, flail motion MV with severe MR Port CXR 9/21:  Tubes in lines in good position. Hazy bilateral opacities. Port CXR 9/23: Lines, tubes, and endotracheal tube in good position. Persistent pulmonary vascular congestion and effusions RUQ ultrasound 9/23 > Gallbladder wall thickening noted, suggestion of sludge, small R effusion, liver unremarkable but left lobe not well visulalized  MICROBIOLOGY: MRSA PCR 9/12:  Negative MRSA PCR 9/15:  Negative Tracheal Asp Ctx 9/15:  Strep agalactiae  Blood Ctx x2 9/15:  Negative  MRSA PCR 9/19:  Negative  Blood culture 9/24: coag neg staph 2/4  ANTIBIOTICS: Zosyn 9/16 - 9/17 Vancomycin 9/16 - 9/17 Rocephin 9/17 >> 9/24 Vancomycin 9/20 (surgical prophylaxis) Vanc 9/25 > Zosyn 9/25 >  Fluconazole 9/25 >   SIGNIFICANT EVENTS: 9/12 Admit to [facility], PCI to [place] 9/14 ETT, CRRT 9/15 to [facility] 9/16 off insulin gtt 9/20 To OR for Mitral Valve Repair w/ bioprosthetic valve 9/28 To OR for permcath 9/29 Extubated  LINES/TUBES: Right IJ PICC 9/12 - 9/20 Rt femoral introducer 9/15 - 9/20 OETT 8.0 9/14 >>  HD cath left IJ 9/14 >> R IJ Introducer/CVL/[place] 9/20 >>  9/25 IABP 9/15 >>9/22 Lt  femoral a line 9/15 >> 9/25 Y Chest Tube/Mediastinal Chest Tube 9/20 >> PIV x1 L brachial art line 9/25 > 9/28 R IJ CVL > 9/28 L IJ Permcath >   ASSESSMENT / PLAN:  PULMONARY A: Acute Hypoxic Respiratory Failure - Secondary to acute pulmonary edema > resolved Has been on minimal vent settings for days as of 9/29 Aspiration risk P:   Extubate today  NPO SLP evaluation  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 Shock - Combination cardiogenic & septic  P:  Continuous Telemetry Monitoring Post-op Mgt per CVTS/Cardiology ASA, Plavix per cardiology/CVTS  Amio, Milrinone gtt's per cardiology/CVTS  RENAL A:   Acute Renal  Failure - Baseline creatinine 1.6-1.7  P:   Nephrology Following Continue volume removal per CVVHD Monitor BMET and UOP Replace electrolytes as needed  GASTROINTESTINAL A:   Transaminitis - peaked 9/23, near complete resolution, unclear etiology Mild Malnutrition - Related to critical illness New onset a fib P:   Pepcid IV q12hr Cont TF SLP evaluation  HEMATOLOGIC A:   Anemia - Last transfusion 9/20 Leukocytosis - Multifactorial  P:  Trending cell counts w/ CBC SCDs  INFECTIOUS A:   Group B Strep Pneumonia Coag negative staph bacteremia (likely contaminant) Line changes on 9/28 P:   Vanc and zosyn restarted on 9/25 and Fluconazole started on 9/25 > consider stopping Re-culture for fever  ENDOCRINE A:   DM - Glucose controlled  P:   Subcutaneous insulin per CVTS Levemir BID  NEUROLOGIC A:   New onset R sided hemiplegia and Preferental gaze to Right (9/25). Cranial CT scan was (-) for acute changes.  > resolved without evidence of weakness on 9/29 Acute Encephalopathy - Multifactorial and likely combination toxic metabolic & hypoxia  P:   Hold off on MRI for now Follow neuro exam Stop sedation protocol  No family at bedside Critical care time: 34 minutes.   Roselie Awkward,  MD [facility] Pager: [phone] Cell: [phone] After 3pm or if no response, call [phone]  09/01/2016, 9:43 AM

## 2016-09-01 NOTE — Progress Notes (Addendum)
[facility] DAILY ICU PROGRESS NOTE                   [address]            [place] [address]          [phone]   1 Day Post-Op Procedure(s) (LRB): INSERTION OF DIALYSIS CATHETER LEFT INTERNAL JUGULAR VEIN & INSERTION OF TRIPLE LUMEN RIGHT INTERNAL JUGULAR VEIN (Bilateral)  9 Days Post-Op Procedure(s) (LRB): MITRAL VALVE REPAIR (MVR) USING 25MM EDWARDS MAGNA EASE BIOPROSTHESIS MITRAL  VALVE (N/A) INTRAOPERATIVE TRANSESOPHAGEAL ECHOCARDIOGRAM (N/A) ENDOVEIN HARVEST OF GREATER SAPHENOUS VEIN (Left) CENTRAL LINE INSERTION RIGHT SUBCLAVIAN (Right)  Total Length of Stay:  LOS: 14 days   Subjective: Appears more alert, frequent loose stooling with flex seal in place Opens eyes and follows commands   Objective: Vital signs in last 24 hours: Temp:  [96.1 F (35.6 C)-99.3 F (37.4 C)] 97.4 F (36.3 C) (09/29 0407) Pulse Rate:  [81-114] 97 (09/29 0700) Cardiac Rhythm: Normal sinus rhythm (09/29 0400) Resp:  [14-29] 19 (09/29 0700) BP: (76-167)/(52-89) 87/61 (09/29 0700) SpO2:  [95 %-100 %] 100 % (09/29 0700) Arterial Line BP: (68-190)/(45-91) 84/52 (09/29 0700) FiO2 (%):  [40 %] 40 % (09/29 0400) Weight:  [157 lb 6.5 oz (71.4 kg)] 157 lb 6.5 oz (71.4 kg) (09/29 0500)  Filed Weights   08/30/16 0312 08/31/16 0130 09/01/16 0500  Weight: 156 lb 12 oz (71.1 kg) 156 lb 8.4 oz (71 kg) 157 lb 6.5 oz (71.4 kg)    Weight change: 14.1 oz (0.4 kg)   Hemodynamic parameters for last 24 hours: CVP:  [7 mmHg-14 mmHg] 10 mmHg  Intake/Output from previous day: 09/28 0701 - 09/29 0700 In: 3034.3 [I.V.:1696.3; NG/GT:580; IV Piggyback:758] Out: 3270 [Emesis/NG output:100; Blood:10]  Intake/Output this shift: No intake/output data recorded.  Vent Mode: PRVC FiO2 (%):  [40 %] 40 % Set Rate:  [16 bmp] 16 bmp Vt Set:  [400 mL] 400 mL PEEP:  [5 cmH20] 5 cmH20 Pressure Support:  [5 cmH20] 5 cmH20 Plateau Pressure:  [13 cmH20-19 cmH20] 18 cmH20  Current Meds: Scheduled  Meds: . Chlorhexidine Gluconate Cloth  6 each Topical Daily  . clopidogrel  75 mg Oral Daily  . famotidine (PEPCID) IV  20 mg Intravenous Q12H  . feeding supplement (NEPRO CARB STEADY)  1,000 mL Per Tube Q24H  . feeding supplement (PRO-STAT SUGAR FREE 64)  30 mL Per Tube 5 X Daily  . insulin aspart  0-24 Units Subcutaneous Q4H  . insulin detemir  10 Units Subcutaneous BID  . mupirocin ointment  1 application Nasal BID  . piperacillin-tazobactam  3.375 g Intravenous Q6H  . sodium chloride flush  3 mL Intravenous Q12H  . vancomycin  750 mg Intravenous Q24H   Continuous Infusions: . sodium chloride Stopped (08/27/16 0900)  . sodium chloride    . sodium chloride 10 mL/hr at 09/01/16 0400  . amiodarone 30 mg/hr (09/01/16 0400)  . dexmedetomidine 1 mcg/kg/hr (09/01/16 0700)  . heparin 10,000 units/ 20 mL infusion syringe 1,800 Units/hr (09/01/16 0700)  . lidocaine Stopped (08/29/16 0900)  . milrinone 0.25 mcg/kg/min (09/01/16 0400)  . dialysis replacement fluid (prismasate) 600 mL/hr at 08/31/16 2340  . dialysis replacement fluid (prismasate) 200 mL/hr at 08/31/16 0610  . dialysate (PRISMASATE) 1,000 mL/hr at 09/01/16 0703   PRN Meds:.sodium chloride, Place/Maintain arterial line **AND** sodium chloride, fentaNYL (SUBLIMAZE) injection, heparin, HYDROcodone-acetaminophen, LORazepam, magnesium hydroxide, ondansetron (ZOFRAN) IV, sodium chloride, sodium chloride flush  General appearance: alert, cooperative and no distress Heart: regular rate and rhythm Lungs: dim in bases Abdomen: soft, nontender Extremities: some edema Wound: incis healing well  Lab Results: CBC: Recent Labs  08/31/16 0336 08/31/16 1702 09/01/16 0500  WBC 20.5*  --  20.2*  HGB 8.6* 9.2* 8.1*  HCT 26.5* 27.0* 25.0*  PLT 157  --  189   BMET:  Recent Labs  08/31/16 1657 08/31/16 1702 09/01/16 0500  NA 135 139 137  K 4.1 4.0 3.6  CL 103  --  106  CO2 20*  --  23  GLUCOSE 171* 168* 164*  BUN 22*  --  21*   CREATININE 1.70*  --  1.47*  CALCIUM 7.8*  --  7.7*    CMET:   ABG    Component Value Date/Time   PHART 7.465 (H) 09/01/2016 0517   PCO2ART 33.1 09/01/2016 0517   PO2ART 133 (H) 09/01/2016 0517   HCO3 23.5 09/01/2016 0517   TCO2 27 08/31/2016 0344   ACIDBASEDEF 3.0 (H) 08/26/2016 1615   O2SAT 44.1 09/01/2016 0518   PT/INR: No results for input(s): LABPROT, INR in the last 72 hours. Radiology: Dg Chest Port 1 View  Result Date: 08/31/2016 CLINICAL DATA:  Status post dialysis catheter insertion. EXAM: PORTABLE CHEST 1 VIEW COMPARISON:  08/23/2016 FINDINGS: There has been interval placement of dual lumen large bore dialysis catheter from left internal jugular approach. The catheter terminates in the expected location of the right atrium. The rest of the of the support apparatus and postsurgical changes are stable. The cardiac silhouette is mildly enlarged. Mediastinal contours appear intact. There is no evidence of pneumothorax. Mild pulmonary edema. Bilateral small pleural effusions cannot be excluded. Osseous structures are without acute abnormality. Soft tissues are grossly normal. IMPRESSION: Post insertion of large bore dual lumen central venous catheter with tip terminating in the expected location of the right atrium. Support apparatus otherwise stable. Mild pulmonary edema.  Possible bilateral small pleural effusions. Electronically Signed   By: Fidela Salisbury M.D.   On: 08/31/2016 17:31   Dg Fluoro Guide Cv Line-no Report  Result Date: 08/31/2016 CLINICAL DATA:  FLOURO GUIDE CV LINE Fluoroscopy was utilized by the requesting physician.  No radiographic interpretation.    Results for orders placed or performed during the hospital encounter of 08/18/16  Surgical pcr screen     Status: None   Collection Time: 08/18/16  2:54 PM  Result Value Ref Range Status   MRSA, PCR NEGATIVE NEGATIVE Final   Staphylococcus aureus NEGATIVE NEGATIVE Final    Comment:        The Xpert SA  Assay (FDA approved for NASAL specimens in patients over 3 years of age), is one component of a comprehensive surveillance program.  Test performance has been validated by [facility] for patients greater than or equal to 44 year old. It is not intended to diagnose infection nor to guide or monitor treatment.   Culture, respiratory (NON-Expectorated)     Status: None   Collection Time: 08/18/16  6:04 PM  Result Value Ref Range Status   Specimen Description TRACHEAL ASPIRATE  Final   Special Requests Normal  Final   Gram Stain   Final    MODERATE WBC PRESENT,BOTH PMN AND MONONUCLEAR ABUNDANT GRAM POSITIVE COCCI IN PAIRS AND CHAINS RARE YEAST    Culture   Final    MODERATE STREPTOCOCCUS AGALACTIAE TESTING AGAINST S. AGALACTIAE NOT ROUTINELY PERFORMED DUE TO PREDICTABILITY OF AMP/PEN/VAN SUSCEPTIBILITY. FEW STAPHYLOCOCCUS AUREUS  Report Status 08/22/2016 FINAL  Final   Organism ID, Bacteria STAPHYLOCOCCUS AUREUS  Final      Susceptibility   Staphylococcus aureus - MIC*    CIPROFLOXACIN <=0.5 SENSITIVE Sensitive     ERYTHROMYCIN 0.5 SENSITIVE Sensitive     GENTAMICIN <=0.5 SENSITIVE Sensitive     OXACILLIN <=0.25 SENSITIVE Sensitive     TETRACYCLINE <=1 SENSITIVE Sensitive     VANCOMYCIN 1 SENSITIVE Sensitive     TRIMETH/SULFA <=10 SENSITIVE Sensitive     CLINDAMYCIN <=0.25 SENSITIVE Sensitive     RIFAMPIN <=0.5 SENSITIVE Sensitive     Inducible Clindamycin NEGATIVE Sensitive     * FEW STAPHYLOCOCCUS AUREUS  Culture, blood (Routine X 2) w Reflex to ID Panel     Status: None   Collection Time: 08/18/16  6:50 PM  Result Value Ref Range Status   Specimen Description BLOOD RIGHT ANTECUBITAL  Final   Special Requests BOTTLES DRAWN AEROBIC AND ANAEROBIC 5CC  Final   Culture NO GROWTH 5 DAYS  Final   Report Status 08/23/2016 FINAL  Final  Culture, blood (Routine X 2) w Reflex to ID Panel     Status: None   Collection Time: 08/18/16  7:00 PM  Result Value Ref Range Status    Specimen Description BLOOD RIGHT HAND  Final   Special Requests BOTTLES DRAWN AEROBIC AND ANAEROBIC 3CC  Final   Culture NO GROWTH 5 DAYS  Final   Report Status 08/23/2016 FINAL  Final  Surgical pcr screen     Status: Abnormal   Collection Time: 08/22/16  8:32 PM  Result Value Ref Range Status   MRSA, PCR NEGATIVE NEGATIVE Final   Staphylococcus aureus POSITIVE (A) NEGATIVE Final    Comment:        The Xpert SA Assay (FDA approved for NASAL specimens in patients over 49 years of age), is one component of a comprehensive surveillance program.  Test performance has been validated by [facility] for patients greater than or equal to 82 year old. It is not intended to diagnose infection nor to guide or monitor treatment.   Culture, blood (routine x 2)     Status: Abnormal   Collection Time: 08/27/16  9:00 AM  Result Value Ref Range Status   Specimen Description BLOOD LEFT HAND  Final   Special Requests IN PEDIATRIC BOTTLE 3CC  Final   Culture  Setup Time   Final    GRAM POSITIVE COCCI IN CLUSTERS IN PEDIATRIC BOTTLE Organism ID to follow CRITICAL RESULT CALLED TO, READ BACK BY AND VERIFIED WITH: A. Pablo Lawrence.D. 16:15 08/29/16 ([email or web address])    Culture (A)  Final    STAPHYLOCOCCUS SPECIES (COAGULASE NEGATIVE) THE SIGNIFICANCE OF ISOLATING THIS ORGANISM FROM A SINGLE SET OF BLOOD CULTURES WHEN MULTIPLE SETS ARE DRAWN IS UNCERTAIN. PLEASE NOTIFY THE MICROBIOLOGY DEPARTMENT WITHIN ONE WEEK IF SPECIATION AND SENSITIVITIES ARE REQUIRED.    Report Status 08/30/2016 FINAL  Final  Blood Culture ID Panel (Reflexed)     Status: Abnormal   Collection Time: 08/27/16  9:00 AM  Result Value Ref Range Status   Enterococcus species NOT DETECTED NOT DETECTED Final   Listeria monocytogenes NOT DETECTED NOT DETECTED Final   Staphylococcus species DETECTED (A) NOT DETECTED Final    Comment: CRITICAL RESULT CALLED TO, READ BACK BY AND VERIFIED WITH: A. Pablo Lawrence.D. 16:15 08/29/16 ([email or web address])     Staphylococcus aureus NOT DETECTED NOT DETECTED Final   Methicillin resistance DETECTED (A) NOT DETECTED Final    Comment: CRITICAL  RESULT CALLED TO, READ BACK BY AND VERIFIED WITH: A. Pablo Lawrence.D. 16:15 08/29/16 ([email or web address])    Streptococcus species NOT DETECTED NOT DETECTED Final   Streptococcus agalactiae NOT DETECTED NOT DETECTED Final   Streptococcus pneumoniae NOT DETECTED NOT DETECTED Final   Streptococcus pyogenes NOT DETECTED NOT DETECTED Final   Acinetobacter baumannii NOT DETECTED NOT DETECTED Final   Enterobacteriaceae species NOT DETECTED NOT DETECTED Final   Enterobacter cloacae complex NOT DETECTED NOT DETECTED Final   Escherichia coli NOT DETECTED NOT DETECTED Final   Klebsiella oxytoca NOT DETECTED NOT DETECTED Final   Klebsiella pneumoniae NOT DETECTED NOT DETECTED Final   Proteus species NOT DETECTED NOT DETECTED Final   Serratia marcescens NOT DETECTED NOT DETECTED Final   Haemophilus influenzae NOT DETECTED NOT DETECTED Final   Neisseria meningitidis NOT DETECTED NOT DETECTED Final   Pseudomonas aeruginosa NOT DETECTED NOT DETECTED Final   Candida albicans NOT DETECTED NOT DETECTED Final   Candida glabrata NOT DETECTED NOT DETECTED Final   Candida krusei NOT DETECTED NOT DETECTED Final   Candida parapsilosis NOT DETECTED NOT DETECTED Final   Candida tropicalis NOT DETECTED NOT DETECTED Final  Culture, blood (routine x 2)     Status: None (Preliminary result)   Collection Time: 08/27/16  9:19 AM  Result Value Ref Range Status   Specimen Description BLOOD RIGHT ASSIST CONTROL  Final   Special Requests BOTTLES DRAWN AEROBIC ONLY 4CC  Final   Culture NO GROWTH 4 DAYS  Final   Report Status PENDING  Incomplete     Assessment/Plan: S/P Procedure(s) (LRB): INSERTION OF DIALYSIS CATHETER LEFT INTERNAL JUGULAR VEIN & INSERTION OF TRIPLE LUMEN RIGHT INTERNAL JUGULAR VEIN (Bilateral)  1 check stool for cdiff, also guiac and place flexiseal- abd is benign but could be  dealing with potential gut ischemia- she is not acidotic 2 H/H is down a little, no need for transfusion currently 3 AHF team managing hemodynamics 4 nephrology managing renal issues- creat improved 5 leukocytosis persists- cont current abx 6 she remains critically ill  GOLD,WAYNE E 09/01/2016 7:21 AM   CoOX   45- will not wean milrinone  Abdomen soft not  tender  Remains on Cordarone and milrinone - junctional rhythm , accelerated  Attempt weaning vent today but doubt ready for extubation I have seen and examined Deborah [name] and agree with the above assessment  and plan.  Grace Isaac MD [facility] [phone] Office [phone] 09/01/2016 8:38 AM

## 2016-09-01 NOTE — Procedures (Signed)
Extubation Procedure Note  Patient Details:   Name: STORY CONTI DOB: 09-08-1956 MRN: [id number]   Airway Documentation:     Evaluation  O2 sats: stable throughout Complications: No apparent complications Patient did tolerate procedure well. Bilateral Breath Sounds: Rhonchi   Yes   Pt tolerated wean, positive for cuff leak, extubated to [facility]. No stridor or dyspnea noted after extubation.   Mariam Dollar 09/01/2016, 9:57 AM

## 2016-09-01 NOTE — Progress Notes (Signed)
      [address]       [place] [address]             [phone]      Extubated earlier today, no complaints at present  Has pulled out feeding tube twice today  BP (!) 116/102   Pulse 97   Temp 97.4 F (36.3 C) (Axillary)   Resp 19   Ht 5\' 1"  (1.549 m) Comment: from 08/15/16 encounter  Wt 157 lb 6.5 oz (71.4 kg)   SpO2 98%   BMI 29.74 kg/m    Intake/Output Summary (Last 24 hours) at 09/01/16 1927 Last data filed at 09/01/16 1900  Gross per 24 hour  Intake          2307.28 ml  Output             3826 ml  Net         -1518.72 ml    Follow pulmonary status closely.  Evaluate swallowing in AM  Russell Quinney C. Roxan Hockey, MD [facility] [phone]

## 2016-09-01 NOTE — Progress Notes (Addendum)
Patient ID: Deborah [name], female   DOB: 19-Jun-1956, 60 y.o.   MRN: [id number]   60 y/o woman with DM2, HTN, HL and noncompliance presented to [facility] ER with infrerior posterior STEMI on 9/12. Taken to cath lab and found to have occluded small to moderate OM-1 branch. Underwent successful PCS with stent x 2 by Dr. Clayborn Bigness. Coronaries otherwise ok. EF 55-60% with severe MR confirmed by echo.   Post-cath developed respiratory failure and required intubation. Developed shock and renal failure with peak creatinine 3.4 (1.6 on admit). Trialysis catheter placed.   Patient persistently acidotic with concern for ischemic MR and transferred here. En route develop severe hypotension and norepinephrine started.   Brought to cath lab 9/15 and TEE showed normal LV (EF 60-65%) and normal RV function with ruptured posterior papillary muscle with severe posterior MR.  Underwent placement of Swan and IABP.   S/P MVR 08/23/16.  IABP pulled 9/22.   Echo 9/23  RV and LV are normal (EF 60%) but appeared underfilled   Extubated this am. Following commands. Denies CP or SOB. Weak.  Remains on CVVHD through new tunneled catheter.   Remains on milrinone 0.25. Co-ox 44%  Hgb 7.6    Scheduled Meds: . atorvastatin  20 mg Oral q1800  . chlorhexidine  15 mL Mouth Rinse BID  . Chlorhexidine Gluconate Cloth  6 each Topical Daily  . clopidogrel  75 mg Oral Daily  . famotidine (PEPCID) IV  20 mg Intravenous Q12H  . feeding supplement (NEPRO CARB STEADY)  1,000 mL Per Tube Q24H  . feeding supplement (PRO-STAT SUGAR FREE 64)  30 mL Per Tube 5 X Daily  . insulin aspart  0-24 Units Subcutaneous Q4H  . insulin detemir  10 Units Subcutaneous BID  . mouth rinse  15 mL Mouth Rinse q12n4p  . mupirocin ointment  1 application Nasal BID  . piperacillin-tazobactam  3.375 g Intravenous Q6H  . sodium chloride flush  3 mL Intravenous Q12H   Continuous Infusions: . sodium chloride Stopped (08/27/16 0900)  . sodium chloride      . sodium chloride 10 mL/hr at 09/01/16 0400  . amiodarone 30 mg/hr (09/01/16 0400)  . heparin 10,000 units/ 20 mL infusion syringe 1,800 Units/hr (09/01/16 1026)  . lidocaine Stopped (08/29/16 0900)  . milrinone 0.25 mcg/kg/min (09/01/16 0400)  . dialysis replacement fluid (prismasate) 600 mL/hr at 09/01/16 0815  . dialysis replacement fluid (prismasate) 200 mL/hr at 08/31/16 0610  . dialysate (PRISMASATE) 1,000 mL/hr at 09/01/16 1218   PRN Meds:.sodium chloride, Place/Maintain arterial line **AND** sodium chloride, fentaNYL (SUBLIMAZE) injection, heparin, HYDROcodone-acetaminophen, LORazepam, magnesium hydroxide, ondansetron (ZOFRAN) IV, sodium chloride, sodium chloride flush    Vitals:   09/01/16 1000 09/01/16 1100 09/01/16 1150 09/01/16 1200  BP: (!) 78/57 94/63  (!) 116/102  Pulse:  98  97  Resp: 18 19  19   Temp:   97.1 F (36.2 C)   TempSrc:      SpO2:  97%  98%  Weight:      Height:        Intake/Output Summary (Last 24 hours) at 09/01/16 1311 Last data filed at 09/01/16 0700  Gross per 24 hour  Intake          2148.52 ml  Output             2115 ml  Net            33.52 ml    LABS: Basic Metabolic Panel:  Recent  Labs  08/31/16 0336 08/31/16 1657 08/31/16 1702 09/01/16 0500  NA 137  135 135 139 137  K 4.1  4.0 4.1 4.0 3.6  CL 105  102 103  --  106  CO2 25  25 20*  --  23  GLUCOSE 169*  169* 171* 168* 164*  BUN 19  19 22*  --  21*  CREATININE 1.30*  1.34* 1.70*  --  1.47*  CALCIUM 8.2*  8.2* 7.8*  --  7.7*  MG 2.5*  --   --  2.2  PHOS 2.1* 5.1*  --   --    Liver Function Tests:  Recent Labs  08/31/16 0336 08/31/16 1657 09/01/16 0500  AST 37  --  37  ALT 123*  --  91*  ALKPHOS 142*  --  143*  BILITOT 1.1  --  1.2  PROT 6.5  --  5.8*  ALBUMIN 3.1*  3.1* 3.0* 2.6*   No results for input(s): LIPASE, AMYLASE in the last 72 hours. CBC:  Recent Labs  08/31/16 0336 08/31/16 1702 09/01/16 0500  WBC 20.5*  --  20.2*  HGB 8.6* 9.2*  8.1*  HCT 26.5* 27.0* 25.0*  MCV 89.2  --  89.6  PLT 157  --  189   Cardiac Enzymes: No results for input(s): CKTOTAL, CKMB, CKMBINDEX, TROPONINI in the last 72 hours. BNP: Invalid input(s): POCBNP D-Dimer: No results for input(s): DDIMER in the last 72 hours. Hemoglobin A1C: No results for input(s): HGBA1C in the last 72 hours. Fasting Lipid Panel: No results for input(s): CHOL, HDL, LDLCALC, TRIG, CHOLHDL, LDLDIRECT in the last 72 hours. Thyroid Function Tests: No results for input(s): TSH, T4TOTAL, T3FREE, THYROIDAB in the last 72 hours.  Invalid input(s): FREET3 Anemia Panel: No results for input(s): VITAMINB12, FOLATE, FERRITIN, TIBC, IRON, RETICCTPCT in the last 72 hours.  RADIOLOGY: Dg Chest 1 View  Result Date: 08/17/2016 CLINICAL DATA:  Endotracheal tube placement, OG tube placement EXAM: CHEST 1 VIEW COMPARISON:  Portable chest x-ray of 08/17/2016 FINDINGS: The tip of the endotracheal tube is within the right mainstem bronchus and needs to be withdrawn by approximately 5 cm per OG tube extends below the hemidiaphragm. Bilateral primarily perihilar airspace disease again is noted most consistent with edema or possibly aspiration. A small left pleural effusion is noted. Right central venous line is unchanged. IMPRESSION: 1. Endotracheal tip is within the right mainstem bronchus and needs to be withdrawn by approximately 5 cm. 2. OG tube extends below the hemidiaphragm. 3. Little change in bilateral airspace disease with small left pleural effusion. Electronically Signed   By: Ivar Drape M.D.   On: 08/17/2016 08:05   Dg Chest 1 View  Result Date: 08/17/2016 CLINICAL DATA:  Acute respiratory failure. EXAM: CHEST 1 VIEW COMPARISON:  08/15/2016 FINDINGS: Type of the right central line in the mid SVC. The bilateral perihilar opacities are becoming more confluent, air bronchograms non noted in the right suprahilar region. Stable cardiomegaly. Increasing left lung base atelectasis.  Small pleural effusion on prior exam is not well visualized, suspect unchanged. No pneumothorax. IMPRESSION: Coalescing perihilar bilateral opacities, suspicious for increasing pulmonary edema. Increasing left lung base atelectasis. Electronically Signed   By: Jeb Levering M.D.   On: 08/17/2016 03:15   Dg Chest 1 View  Result Date: 08/15/2016 CLINICAL DATA:  Respiratory failure EXAM: CHEST 1 VIEW COMPARISON:  January 22, 2010 FINDINGS: The mediastinal contour is normal. The heart size is enlarged. The diffuse airspace opacities identified throughout bilateral lungs.  There is probably a minimal left pleural effusion. The osseous structures are normal. IMPRESSION: Congestive heart failure. Electronically Signed   By: Abelardo Diesel M.D.   On: 08/15/2016 19:25   Dg Abd 1 View  Result Date: 08/17/2016 CLINICAL DATA:  Endotracheal tube placement, OG tube placement EXAM: ABDOMEN - 1 VIEW COMPARISON:  CT abdomen pelvis of 12/10/2010 FINDINGS: The OG tube tip extends into the body the stomach. The bowel gas pattern is nonspecific. Contrast is noted being excreted by the kidneys and within the urinary bladder secondary to recent CT of the abdomen pelvis. The bowel gas pattern is nonspecific. There may be a small left pleural effusion present. IMPRESSION: OG tube tip extends into the body the stomach. The bowel gas pattern is nonspecific. Electronically Signed   By: Ivar Drape M.D.   On: 08/17/2016 08:09   Ct Head Wo Contrast  Result Date: 08/28/2016 CLINICAL DATA:  Status post intubation. EXAM: CT HEAD WITHOUT CONTRAST TECHNIQUE: Contiguous axial images were obtained from the base of the skull through the vertex without intravenous contrast. COMPARISON:  CT head dated 01/22/2010. FINDINGS: Brain: There is mild generalized parenchymal atrophy with commensurate dilatation of the ventricles and sulci. Small old lacunar infarct noted within the right basal ganglia region. There is no mass, hemorrhage, edema or  other evidence of acute parenchymal abnormality. No extra-axial hemorrhage. Vascular: There are chronic calcified atherosclerotic changes of the large vessels at the skull base. No unexpected hyperdense vessel. Skull: Normal. Negative for fracture or focal lesion. Sinuses/Orbits: No acute finding. Other: None IMPRESSION: No acute findings.  No intracranial mass, hemorrhage or edema. Electronically Signed   By: Franki Cabot M.D.   On: 08/28/2016 19:27   Dg Chest Port 1 View  Result Date: 08/31/2016 CLINICAL DATA:  Status post dialysis catheter insertion. EXAM: PORTABLE CHEST 1 VIEW COMPARISON:  08/23/2016 FINDINGS: There has been interval placement of dual lumen large bore dialysis catheter from left internal jugular approach. The catheter terminates in the expected location of the right atrium. The rest of the of the support apparatus and postsurgical changes are stable. The cardiac silhouette is mildly enlarged. Mediastinal contours appear intact. There is no evidence of pneumothorax. Mild pulmonary edema. Bilateral small pleural effusions cannot be excluded. Osseous structures are without acute abnormality. Soft tissues are grossly normal. IMPRESSION: Post insertion of large bore dual lumen central venous catheter with tip terminating in the expected location of the right atrium. Support apparatus otherwise stable. Mild pulmonary edema.  Possible bilateral small pleural effusions. Electronically Signed   By: Fidela Salisbury M.D.   On: 08/31/2016 17:31   Dg Chest Port 1 View  Result Date: 08/31/2016 CLINICAL DATA:  Mitral valve replacement. EXAM: PORTABLE CHEST 1 VIEW COMPARISON:  08/30/2016 FINDINGS: Endotracheal tube, NG tube, feeding tube, dual lumen left IJ catheter, right IJ catheter in stable position. Prior cardiac valve replacement. Cardiomegaly with bilateral pulmonary infiltrates consistent pulmonary edema again noted. Small right pleural effusion. IMPRESSION: 1. Lines and tubes in stable  position. 2. Prior cardiac valve replacement. Cardiomegaly with bilateral pulmonary infiltrates consistent with pulmonary edema again noted. Small right pleural effusion. No significant interim change. Electronically Signed   By: Marcello Moores  Register   On: 08/31/2016 07:39   Dg Chest Port 1 View  Result Date: 08/30/2016 CLINICAL DATA:  61 year old female status post mitral valve replacement. Initial encounter. EXAM: PORTABLE CHEST 1 VIEW COMPARISON:  08/29/2016 and earlier. FINDINGS: Portable AP semi upright view at 0628 hours. Stable endotracheal tube. Visible  enteric tubes appear stable. Stable dual-lumen left IJ approach central line. Stable right subclavian approach central line. Stable cardiac size and mediastinal contours. Continued veiling opacity at both lung bases an diffuse pulmonary vascular congestion. No pneumothorax. Stable ventilation since yesterday. IMPRESSION: 1.  Stable lines and tubes. 2. Stable ventilation since yesterday with pulmonary interstitial edema superimposed on bilateral pleural effusions and lower lobe collapse or consolidation. Electronically Signed   By: Genevie Ann M.D.   On: 08/30/2016 07:34   Dg Chest Port 1 View  Result Date: 08/29/2016 CLINICAL DATA:  Hypoxia EXAM: PORTABLE CHEST 1 VIEW COMPARISON:  August 27, 2016 FINDINGS: Endotracheal tube tip is 3.7 cm above the carina. Central catheter tip is in the superior vena cava. Nasogastric and feeding tube tips are below the diaphragm. There is a mediastinal drain and right chest tube. No pneumothorax. There are bilateral pleural effusions with patchy airspace consolidation in the left base and somewhat generalized interstitial edema. Edema and effusions have increased compared to recent study. Consolidation in the left base is stable. Cardiomegaly is stable. The pulmonary vascular is normal. Patient is status post mitral valve replacement. IMPRESSION: Tube and catheter positions as described without pneumothorax. Persistent  consolidation left base. New pleural effusions with interstitial edema. Suspect a degree of congestive heart failure. Electronically Signed   By: Lowella Grip III M.D.   On: 08/29/2016 08:28   Dg Chest Port 1 View  Result Date: 08/27/2016 CLINICAL DATA:  60 year old female with a history of mitral valve repair EXAM: PORTABLE CHEST 1 VIEW COMPARISON:  08/27/2016, 08/26/2016, 08/25/2016 FINDINGS: Cardiomediastinal silhouette unchanged. Surgical changes of prior median sternotomy and mitral valve annuloplasty. Unchanged position of right-sided thoracostomy tube, gastric tube, right subclavian central line. Unchanged position of the endotracheal tube. Unchanged left-sided IJ central venous catheter. Unchanged position of mediastinal drain and epicardial pacing leads. Interval removal of right IJ sheath and Swan-Ganz catheter. Minimal interstitial airspace opacities at the lung bases. Blunting of left costophrenic angle with pleural parenchymal thickening. No pneumothorax. IMPRESSION: Interval removal of right IJ sheath and Swan-Ganz catheter. Mild airspace disease at the bases with likely trace left pleural effusion. Unchanged endotracheal tube, gastric tube, right subclavian central line, left IJ catheter, mediastinal/pleural drains, and epicardial pacing leads. Surgical changes of median sternotomy and mitral valve annuloplasty. Signed, Dulcy Fanny. Earleen Newport, DO [facility] Electronically Signed   By: Corrie Mckusick D.O.   On: 08/27/2016 17:12   Dg Chest Port 1 View  Result Date: 08/26/2016 CLINICAL DATA:  Status post cardiac surgery and valve replacement. EXAM: PORTABLE CHEST 1 VIEW COMPARISON:  08/25/2016 FINDINGS: Changes from the recent cardiac surgery and mitral valve replacement are stable from most recent prior exam. Cardiac silhouette is mildly enlarged. There is no mediastinal widening. Mild lower lung zone hazy opacity is noted consistent with  a combination of small pleural effusions and atelectasis. Mild interstitial thickening is noted centrally which has improved. There was no overt pulmonary edema. No pneumothorax. Endotracheal tube, right internal jugular Swan-Ganz catheter, left internal jugular dual-lumen central venous catheter, nasal/orogastric tube, right chest tube and mediastinal tube are stable in well positioned. Right subclavian central venous line catheter tip projects in the right atrium, also stable. IMPRESSION: 1. Improved lung aeration when compared the prior exam consistent with improved pulmonary edema. 2. Lung base opacity noted consistent with small effusions and atelectasis. 3. No pneumothorax.  No mediastinal widening. 4. Support apparatus is stable. Electronically Signed   By: Lajean Manes  M.D.   On: 08/26/2016 08:03   Dg Chest Port 1 View  Result Date: 08/25/2016 CLINICAL DATA:  Status post mitral valve replacement EXAM: PORTABLE CHEST 1 VIEW COMPARISON:  08/24/2016 FINDINGS: Cardiac shadow is stable. Intra-aortic balloon pump is again noted just below the aortic knob. Changes of mitral valve replacement are again seen. Swan-Ganz catheter is noted in the pulmonary outflow tract. Temporary dialysis catheter is noted via the left jugular approach. Right subclavian central line is noted as well. Endotracheal tube and mediastinal drain are again noted. The degree of vascular congestion has improved somewhat in the interval from the prior exam although persists. Bilateral pleural effusions right greater than left are noted. No focal confluent infiltrate is seen. IMPRESSION: Persistent but improved vascular congestion with bilateral effusions. Tubes and lines as described. Electronically Signed   By: Inez Catalina M.D.   On: 08/25/2016 08:40   Dg Chest Port 1 View  Result Date: 08/24/2016 CLINICAL DATA:  Mitral valve replacement, chest tube EXAM: PORTABLE CHEST 1 VIEW COMPARISON:  08/23/2016 FINDINGS: PA support devices  including right chest tube, endotracheal tube Swan-Ganz catheter are unchanged. No pneumothorax. Diffuse interstitial prominence throughout the lungs is increased, likely mild edema. Mild cardiomegaly. Left base atelectasis crash that left base atelectasis has increased. IMPRESSION: Increasing interstitial prominence throughout the lungs, likely worsening interstitial edema. Increasing left base atelectasis. Electronically Signed   By: Rolm Baptise M.D.   On: 08/24/2016 08:41   Dg Chest Portable 1 View  Result Date: 08/23/2016 CLINICAL DATA:  Hypoxia EXAM: PORTABLE CHEST 1 VIEW COMPARISON:  Study obtained earlier in the day FINDINGS: Endotracheal tube tip is 2.6 cm above the carina. Central catheter placed from the right side has its tip in the right atrium slightly beyond the cavoatrial junction. Swan-Ganz catheter tip is in the proximal right main pulmonary artery. Left jugular catheter tip is in the superior vena cava. Nasogastric tube is no longer apparent. Temporary pacemaker wires are attached to the right heart. No pneumothorax. There is perihilar and central interstitial edema. No airspace consolidation. Heart is mildly enlarged. There is mild pulmonary venous hypertension. Prosthetic cardiac valve present. No adenopathy evident. IMPRESSION: Tube and catheter positions as described without pneumothorax. Note that the new catheter placed on the right side has its tip in the right atrium slightly beyond the cavoatrial junction. There is interstitial edema with mild cardiomegaly and mild pulmonary venous hypertension. No airspace consolidation appreciable. Electronically Signed   By: Lowella Grip III M.D.   On: 08/23/2016 15:34   Dg Chest Port 1 View  Result Date: 08/23/2016 CLINICAL DATA:  Respiratory failure. Acute on chronic diastolic heart failure. Cardiogenic shock. EXAM: PORTABLE CHEST 1 VIEW COMPARISON:  08/22/2016, 08/21/2016 and 08/20/2016 and 08/15/2016 FINDINGS: Endotracheal tube, NG  tube and right and left jugular vein central catheters appear in good position, unchanged. Intra aortic balloon pump in place. Heart size and pulmonary vascularity are now normal. Small bilateral pleural effusions. Slight haziness at the lung bases is most likely secondary to the effusions although I think there is an element of slight residual pulmonary edema as well. IMPRESSION: Improving pulmonary edema.  Persistent small bilateral effusions. Electronically Signed   By: Lorriane Shire M.D.   On: 08/23/2016 07:44   Dg Chest Port 1 View  Result Date: 08/22/2016 CLINICAL DATA:  Heart failure.  Balloon pump advanced. EXAM: PORTABLE CHEST 1 VIEW COMPARISON:  08/22/2016 at 4:50 a.m. FINDINGS: Metallic tip of the balloon pump projects over the aortic knob, well  positioned. Endotracheal tube, right and left internal jugular central venous lines, femoral a placed Swan-Ganz catheter and nasal/orogastric tube are stable in well positioned. Lung aeration appears mildly improved although the difference may be technical only. There still vascular congestion and interstitial and hazy airspace opacity consistent with asymmetric pulmonary edema. Small pleural effusions are noted. IMPRESSION: 1. Intra-aortic balloon pump tip projects over the aortic knob, well positioned. 2. Possible mild improvement in lung aeration since the earlier study although the difference may be technical only. No other evidence of a change. Electronically Signed   By: Lajean Manes M.D.   On: 08/22/2016 16:34   Dg Chest Port 1 View  Result Date: 08/22/2016 CLINICAL DATA:  Respiratory failure, shortness of Breath EXAM: PORTABLE CHEST 1 VIEW COMPARISON:  08/21/2016 FINDINGS: Support devices are stable. Cardiomegaly. Bilateral perihilar and lower lobe opacities throughout the lungs have increased slightly since prior study, likely mild edema. Suspect small layering effusions. IMPRESSION: Mild interval worsening of pulmonary edema/ CHF. Small  layering effusions. Electronically Signed   By: Rolm Baptise M.D.   On: 08/22/2016 07:30   Dg Chest Port 1 View  Result Date: 08/21/2016 CLINICAL DATA:  Acute hypoxemic respiratory failure EXAM: PORTABLE CHEST 1 VIEW COMPARISON:  08/20/2016 FINDINGS: Support devices are stable. Endotracheal tube remains at the level of the carina. Borderline heart size. Bilateral airspace opacities are again noted, likely edema. Findings stable or slightly worsened since prior study. Possible small right effusion. IMPRESSION: Slight interval worsening in pulmonary edema pattern. Small right effusion. Endotracheal tube remains at the level of the carina and could be retracted 2-3 cm for optimal positioning. Electronically Signed   By: Rolm Baptise M.D.   On: 08/21/2016 07:40   Dg Chest Port 1 View  Result Date: 08/20/2016 CLINICAL DATA:  Pt was hypotensive with respiratory failure x a few days ago. Hx of DM, and HTN. Pt had a cardiac cath on 08-15-16. EXAM: PORTABLE CHEST 1 VIEW COMPARISON:  08/19/2016 FINDINGS: Endotracheal to appears low at the level of the carina. LEFT and RIGHT central venous line are unchanged. Swan-Ganz catheter from an inferior approach unchanged. There is some improvement aeration lung bases. Mild pulmonary edema remains. No pneumothorax. IMPRESSION: 1. Endotracheal tube appears low at the level the carina. 2. Stable support apparatus. 3. Slight improved aeration lung bases with persistent mild edema. Electronically Signed   By: Suzy Bouchard M.D.   On: 08/20/2016 07:58   Dg Chest Port 1 View  Result Date: 08/19/2016 CLINICAL DATA:  Pulmonary edema EXAM: PORTABLE CHEST 1 VIEW COMPARISON:  08/18/2016 FINDINGS: External pad artifact. Endotracheal tube, central venous lines, and NG tube unchanged. Swan-Ganz catheter and intra aortic balloon pump unchanged. Stable cardiac silhouette. Mild central venous pulmonary congestion. No overt pulmonary edema. No pneumothorax. IMPRESSION: 1. Stable support  apparatus. 2. Central venous congestion.  No significant change. Electronically Signed   By: Suzy Bouchard M.D.   On: 08/19/2016 07:55   Dg Chest Port 1 View  Result Date: 08/18/2016 CLINICAL DATA:  Placement of femoral Swan-Ganz catheter and intra-aortic balloon pump catheter. EXAM: PORTABLE CHEST 1 VIEW 4:14 p.m.: COMPARISON:  Portable chest x-ray earlier today 3:37 p.m. and previously. FINDINGS: Femoral Swan-Ganz catheter tip projects at the expected location of the right middle lobe pulmonary artery. Intra-aortic balloon pump catheter tip projects over the proximal descending thoracic aorta. Endotracheal tube tip now projects approximately 2-3 cm above the carina. Right jugular central venous catheter tip projects over the upper SVC. Left jugular central  venous catheter tip projects over the lower SVC. Nasogastric tube courses below the diaphragm into the stomach. Improved aeration in the left lower lobe since the most recent examination 45 minutes earlier, though streaky opacities persist. Improved perihilar airspace pulmonary edema in both lungs. No new pulmonary parenchymal abnormalities. IMPRESSION: 1. Swan-Ganz catheter tip projects at the expected location of the right middle lobe pulmonary artery. 2. Intra-aortic balloon pump catheter tip projects over the proximal descending thoracic aorta. 3. Remaining support apparatus satisfactory. 4. Improved aeration in both lungs since the examination performed 45 minutes ago, although streaky left lower lobe atelectasis and/or pneumonia and mild perihilar airspace pulmonary edema persists. 5. No new abnormalities. Electronically Signed   By: Evangeline Dakin M.D.   On: 08/18/2016 16:43   Dg Chest Port 1 View  Result Date: 08/18/2016 CLINICAL DATA:  ST-elevation myocardial infarction and cardiogenic shock. EXAM: PORTABLE CHEST 1 VIEW COMPARISON:  Yesterday FINDINGS: Endotracheal tube tip 1 cm above the carina. Central line on the right and dialysis  catheter on the left with tips at the lower SVC. Orogastric tube reaches the stomach at least. Stable borderline heart size. Worsening aeration at the left base with newly obscured diaphragm. Perihilar airspace disease. IMPRESSION: 1. Lower endotracheal tube with tip 1 cm above the carina. 2. New atelectasis or consolidation in the left lower lobe with small effusion. 3. Pulmonary edema. Electronically Signed   By: Monte Fantasia M.D.   On: 08/18/2016 07:02   Dg Chest Port 1 View  Result Date: 08/17/2016 CLINICAL DATA:  Central line placement EXAM: PORTABLE CHEST 1 VIEW COMPARISON:  Earlier same day FINDINGS: Endotracheal tube is been withdrawn with the tip 2.5 cm proximal to the carina. Right internal jugular central line remains in place with its tip in the SVC above the right atrium. New dual-lumen left internal jugular central line has its tip in the SVC above the right atrium. No pneumothorax. Bilateral bat wing airspace filling is seen consistent with acute edema or acute airspace filling due to other etiologies. No effusions. No collapse. IMPRESSION: Left IJ central line well position with tip in the SVC above the right atrium. No pneumothorax. Endotracheal tube repositioned, well positioned with tip 2.5 cm above the carina. Persistent worsen bat wing edema or airspace filling pattern. Electronically Signed   By: Nelson Chimes M.D.   On: 08/17/2016 10:35   Dg Chest Port 1 View  Result Date: 08/15/2016 CLINICAL DATA:  Central line placement EXAM: PORTABLE CHEST 1 VIEW COMPARISON:  August 15, 2016 7:04 p.m. FINDINGS: Right central venous line is identified with distal tip in superior vena cava. There is no pneumothorax. The heart size mildly enlarged. Diffuse airspace opacities identified throughout both lungs. The bones are unchanged. IMPRESSION: Right central venous line distal tip in the superior vena cava. There is no pneumothorax. Pulmonary edema unchanged compared prior exam. Electronically  Signed   By: Abelardo Diesel M.D.   On: 08/15/2016 21:10   Dg Chest Port 1v Same Day  Result Date: 08/27/2016 CLINICAL DATA:  Post MITRAL VALVE REPAIR (MVR), INTRAOPERATIVE TRANSESOPHAGEAL ECHOCARDIOGRAM, ENDOVEIN HARVEST OF GREATER SAPHENOUS VEIN, CENTRAL LINE INSERTION on 08/23/2016. H/O diabetes, hypertension. Pt is intubated and on 24/7 dialysis. Non smoker EXAM: PORTABLE CHEST 1 VIEW COMPARISON:  08/26/2016 FINDINGS: right IJ Swan-Ganz catheter unchanged. There is also a left-sided internal jugular dialysis catheter which terminates at the mid SVC. Endotracheal tube terminates 3.3 cm above carina.Nasogastric and feeding tubes extend beyond the inferior aspect of the film. Mediastinal  drain and right-sided chest tube. Cardiomegaly accentuated by AP portable technique. No pleural effusion or pneumothorax. Persistent mild to moderate interstitial edema. Bibasilar airspace disease. Right-sided subclavian line is also unchanged. IMPRESSION: No significant change since the prior exam. Cardiomegaly with mild to moderate interstitial edema and bibasilar airspace disease. Stable support apparatus, without pneumothorax. Electronically Signed   By: [name] Miyamoto M.D.   On: 08/27/2016 09:06   Dg Abd Portable 1v  Result Date: 08/29/2016 CLINICAL DATA:  Status post feeding tube placement EXAM: PORTABLE ABDOMEN - 1 VIEW COMPARISON:  Supine abdominal radiograph of August 25, 2016 FINDINGS: The tip of the feeding tube projects to the left of midline. This may have read curved upon itself and lie in the gastric body but may also lie in the fourth portion of the duodenum or at the proximal portion of the jejunum. The bowel gas pattern is unremarkable. IMPRESSION: The feeding tube tip lies in likely in the distal duodenum just proximal to the jejunum but the possibility of it having re- curved upon itself and still lie in the gastric body is raised. A cross-table lateral radiograph may be useful. Electronically Signed   By:  David  Martinique M.D.   On: 08/29/2016 12:34   Dg Abd Portable 1v  Result Date: 08/25/2016 CLINICAL DATA:  Feeding tube placement. EXAM: PORTABLE ABDOMEN - 1 VIEW COMPARISON:  08/22/2016. FINDINGS: Feeding tube noted with its tip projected over the distal stomach/ proximal duodenum. NG tube noted in stable position with tip over the stomach. Left femoral catheter noted. Right femoral sheath in stable position . Aortic balloon pump tip noted L3. No bowel distention. IMPRESSION: Interim placement of feeding tube, its tip is projected over the distal stomach/ duodenum. Remaining lines and tubes including aortic balloon pump in stable position . Electronically Signed   By: Marcello Moores  Register   On: 08/25/2016 12:50   Dg Abd Portable 1v  Result Date: 08/22/2016 CLINICAL DATA:  Balloon pump position EXAM: PORTABLE ABDOMEN - 1 VIEW COMPARISON:  Chest x-ray 08/22/2016.  KUB 08/17/2016 FINDINGS: Right femoral Swan-Ganz catheter extends above the diaphragm . Intra-aortic balloon pump marker appears overlying the L3 vertebral body in previously was in the thoracic aorta. Left femoral venous catheter in the left iliac vein. NG tube in the stomach. Normal bowel gas pattern. IMPRESSION: Intra-aortic balloon pump marker  overlies the L3 vertebral body. Electronically Signed   By: Franchot Gallo M.D.   On: 08/22/2016 13:12   Dg Abd Portable 1v  Result Date: 08/19/2016 CLINICAL DATA:  Acidosis EXAM: PORTABLE ABDOMEN - 1 VIEW COMPARISON:  08/17/2016 FINDINGS: No dilated loops large or small bowel. NG tube in stomach. Swan-Ganz catheter noted LEFT basilar atelectasis. The kidneys are dense suggesting retained IV contrast . IMPRESSION: 1. Normal bowel-gas pattern. 2. Retained contrast in the kidneys consistent consistent with acute kidney injury. Electronically Signed   By: Suzy Bouchard M.D.   On: 08/19/2016 08:02   Dg Fluoro Guide Cv Line-no Report  Result Date: 08/31/2016 CLINICAL DATA:  FLOURO GUIDE CV LINE Fluoroscopy  was utilized by the requesting physician.  No radiographic interpretation.   US Abdomen Limited Ruq  Result Date: 08/26/2016 CLINICAL DATA:  Acute onset of abnormal LFTs.  Initial encounter. EXAM: US ABDOMEN LIMITED - RIGHT UPPER QUADRANT COMPARISON:  None. FINDINGS: Gallbladder: Gallbladder wall thickening is noted, measuring up to 5 mm. Vague internal echoes are seen within the gallbladder, suggestive of underlying sludge. No definite stones are identified. No pericholecystic fluid is seen. Evaluation  for ultrasonographic Murphy's sign is not possible, as the patient is sedated and on ventilation. Common bile duct: Diameter: 0.5 cm, within normal limits in caliber. Liver: No focal lesion identified. Within normal limits in parenchymal echogenicity. The left hepatic lobe is not assessed as the patient has overlying bandages, due to recent CABG and mitral valve replacement. A small right-sided pleural effusion is noted. IMPRESSION: 1. Gallbladder wall thickening noted. Suggestion of underlying sludge filling the gallbladder. No definite evidence of cholelithiasis. Findings may reflect mild chronic inflammation. No pericholecystic fluid is seen to suggest acute cholecystitis. 2. Small right-sided pleural effusion noted. 3. Liver grossly unremarkable in appearance, though the left hepatic lobe is not imaged on this study. Electronically Signed   By: Garald Balding M.D.   On: 08/26/2016 18:29    PHYSICAL EXAM General: Intubated/sedated Neck: RIJ TLC LIJ trialysis  Lungs: Clear anteriorly CV: Nondisplaced PMI.  Heart regular S1/S2, no S3/S4, 3/6 HSM apex.  Abdomen: Soft, ND, no HSM. No bruits or masses. +BS  Neurologic: Sedated.  Not responding to verbal stimuli.  Extremities: No clubbing or cyanosis. 1-2+ edema GU: Foley    TELEMETRY: Reviewed, remains in Sinus Tach.   Assessment:   1. Cardiogenic shock: Due to acute severe MR.  2. CAD: Inferior posterior STEMI on 9/13 with DES x 2 to OM-1       --Plavix restarted on 9/22 3. 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     --s/p MVR 4. AKI on CKD stage III - due to ATN likely from cardiogenic shock + contrast with cath. Has required CVVH.  5. Acute respiratory failure: Pulmonary edema.  6. DM2 7. Suspect component of septic shock: Possible PNA, group B Strep in sputum culture.  8. Anemia 9. PVCs   10. Shock liver 11. MRSA bactermia   Plan/Discussion:    Now extubated. Off pressors except for milrinone. Co-ox 44% in setting of hgb 7.6. Will wean off precedex and repeat co-ox. May need 1u RBCs. Titrate milrinone as needed but with normal EF hopefully we can titrate off soon.   Volume status much improved on CVVHD. Now with tunneled HD cath placed 9/28  Has MRSA bacteremia. Felt to be contaminant. Stop vanc/zosyn tomorrow (total 5 days)  Ectopy stable off lido.   Diarrhea likely due to TFs. C.diff negative.   Sagar Tengan,MD 1:11 PM  Advanced Heart Failure Team Pager [phone] (M-F; 7a - 4p)  Please contact [facility] for night-coverage after hours (4p -7a ) and weekends on [email or web address]

## 2016-09-01 NOTE — Anesthesia Postprocedure Evaluation (Signed)
Anesthesia Post Note  Patient: BREANDA GREENLAW  Procedure(s) Performed: Procedure(s) (LRB): INSERTION OF DIALYSIS CATHETER LEFT INTERNAL JUGULAR VEIN & INSERTION OF TRIPLE LUMEN RIGHT INTERNAL JUGULAR VEIN (Bilateral)  Patient location during evaluation: SICU Anesthesia Type: General Level of consciousness: awake and alert and patient cooperative Pain management: pain level controlled Vital Signs Assessment: post-procedure vital signs reviewed and stable Respiratory status: spontaneous breathing, nonlabored ventilation, respiratory function stable and patient connected to nasal cannula oxygen Cardiovascular status: stable Postop Assessment: no signs of nausea or vomiting Anesthetic complications: no Comments: Remains on CVVH    Last Vitals:  Vitals:   09/01/16 1200 09/01/16 1657  BP: (!) 116/102   Pulse: 97   Resp: 19   Temp:  36.3 C    Last Pain:  Vitals:   09/01/16 1657  TempSrc: Axillary  PainSc:                  Jaylenn Altier,E. Daveah Varone

## 2016-09-02 ENCOUNTER — Inpatient Hospital Stay (HOSPITAL_COMMUNITY): Payer: Medicaid Other

## 2016-09-02 DIAGNOSIS — J9601 Acute respiratory failure with hypoxia: Secondary | ICD-10-CM

## 2016-09-02 LAB — BLOOD GAS, ARTERIAL
ACID-BASE DEFICIT: 4.1 mmol/L — AB (ref 0.0–2.0)
BICARBONATE: 18.5 mmol/L — AB (ref 20.0–28.0)
O2 CONTENT: 10 L/min
O2 SAT: 93.3 %
PATIENT TEMPERATURE: 97.2
pCO2 arterial: 22.4 mmHg — ABNORMAL LOW (ref 32.0–48.0)
pH, Arterial: 7.524 — ABNORMAL HIGH (ref 7.350–7.450)
pO2, Arterial: 64.9 mmHg — ABNORMAL LOW (ref 83.0–108.0)

## 2016-09-02 LAB — GLUCOSE, CAPILLARY
GLUCOSE-CAPILLARY: 142 mg/dL — AB (ref 65–99)
GLUCOSE-CAPILLARY: 174 mg/dL — AB (ref 65–99)
GLUCOSE-CAPILLARY: 225 mg/dL — AB (ref 65–99)
Glucose-Capillary: 127 mg/dL — ABNORMAL HIGH (ref 65–99)
Glucose-Capillary: 100 mg/dL — ABNORMAL HIGH (ref 65–99)

## 2016-09-02 LAB — RENAL FUNCTION PANEL
Albumin: 2.5 g/dL — ABNORMAL LOW (ref 3.5–5.0)
Anion gap: 12 (ref 5–15)
BUN: 19 mg/dL (ref 6–20)
CALCIUM: 7.8 mg/dL — AB (ref 8.9–10.3)
CO2: 19 mmol/L — AB (ref 22–32)
CREATININE: 1.3 mg/dL — AB (ref 0.44–1.00)
Chloride: 105 mmol/L (ref 101–111)
GFR calc Af Amer: 51 mL/min — ABNORMAL LOW (ref >60)
GFR calc non Af Amer: 44 mL/min — ABNORMAL LOW (ref >60)
GLUCOSE: 114 mg/dL — AB (ref 65–99)
Phosphorus: 2.4 mg/dL — ABNORMAL LOW (ref 2.5–4.6)
Potassium: 3.8 mmol/L (ref 3.5–5.1)
SODIUM: 136 mmol/L (ref 135–145)

## 2016-09-02 LAB — POCT ACTIVATED CLOTTING TIME
ACTIVATED CLOTTING TIME: 224 s
Activated Clotting Time: 224 seconds
Activated Clotting Time: 224 seconds
Activated Clotting Time: 230 seconds
Activated Clotting Time: 230 seconds

## 2016-09-02 LAB — POCT I-STAT 3, ART BLOOD GAS (G3+)
ACID-BASE DEFICIT: 4 mmol/L — AB (ref 0.0–2.0)
Acid-Base Excess: 2 mmol/L (ref 0.0–2.0)
BICARBONATE: 17 mmol/L — AB (ref 20.0–28.0)
Bicarbonate: 24.6 mmol/L (ref 20.0–28.0)
Bicarbonate: 23 mmol/L (ref 20.0–28.0)
O2 SAT: 97 %
O2 SAT: 98 %
O2 Saturation: 92 %
PCO2 ART: 29.2 mmHg — AB (ref 32.0–48.0)
PO2 ART: 73 mmHg — AB (ref 83.0–108.0)
PO2 ART: 47 mmHg — AB (ref 83.0–108.0)
Patient temperature: 36
Patient temperature: 94.2
TCO2: 26 mmol/L (ref 0–100)
TCO2: 18 mmol/L (ref 0–100)
TCO2: 24 mmol/L (ref 0–100)
pCO2 arterial: 19.2 mmHg — CL (ref 32.0–48.0)
pCO2 arterial: 32.1 mmHg (ref 32.0–48.0)
pH, Arterial: 7.53 — ABNORMAL HIGH (ref 7.350–7.450)
pH, Arterial: 7.546 — ABNORMAL HIGH (ref 7.350–7.450)
pH, Arterial: 7.462 — ABNORMAL HIGH (ref 7.350–7.450)
pO2, Arterial: 96 mmHg (ref 83.0–108.0)

## 2016-09-02 LAB — COMPREHENSIVE METABOLIC PANEL
ALBUMIN: 2.7 g/dL — AB (ref 3.5–5.0)
ALK PHOS: 121 U/L (ref 38–126)
ALT: 86 U/L — AB (ref 14–54)
ALT: 87 U/L — ABNORMAL HIGH (ref 14–54)
ANION GAP: 17 — AB (ref 5–15)
AST: 42 U/L — ABNORMAL HIGH (ref 15–41)
AST: 45 U/L — ABNORMAL HIGH (ref 15–41)
Albumin: 2.6 g/dL — ABNORMAL LOW (ref 3.5–5.0)
Alkaline Phosphatase: 109 U/L (ref 38–126)
Anion gap: 10 (ref 5–15)
BILIRUBIN TOTAL: 1.6 mg/dL — AB (ref 0.3–1.2)
BILIRUBIN TOTAL: 1.7 mg/dL — AB (ref 0.3–1.2)
BUN: 20 mg/dL (ref 6–20)
BUN: 18 mg/dL (ref 6–20)
CALCIUM: 8.2 mg/dL — AB (ref 8.9–10.3)
CALCIUM: 8.1 mg/dL — AB (ref 8.9–10.3)
CO2: 23 mmol/L (ref 22–32)
CO2: 17 mmol/L — ABNORMAL LOW (ref 22–32)
CREATININE: 1.35 mg/dL — AB (ref 0.44–1.00)
Chloride: 105 mmol/L (ref 101–111)
Chloride: 101 mmol/L (ref 101–111)
Creatinine, Ser: 1.27 mg/dL — ABNORMAL HIGH (ref 0.44–1.00)
GFR, EST AFRICAN AMERICAN: 49 mL/min — AB (ref >60)
GFR, EST AFRICAN AMERICAN: 52 mL/min — AB (ref >60)
GFR, EST NON AFRICAN AMERICAN: 42 mL/min — AB (ref >60)
GFR, EST NON AFRICAN AMERICAN: 45 mL/min — AB (ref >60)
GLUCOSE: 146 mg/dL — AB (ref 65–99)
Glucose, Bld: 125 mg/dL — ABNORMAL HIGH (ref 65–99)
POTASSIUM: 3.8 mmol/L (ref 3.5–5.1)
Potassium: 3.6 mmol/L (ref 3.5–5.1)
Sodium: 138 mmol/L (ref 135–145)
Sodium: 135 mmol/L (ref 135–145)
TOTAL PROTEIN: 6.4 g/dL — AB (ref 6.5–8.1)
Total Protein: 6.1 g/dL — ABNORMAL LOW (ref 6.5–8.1)

## 2016-09-02 LAB — POCT I-STAT 4, (NA,K, GLUC, HGB,HCT)
Glucose, Bld: 130 mg/dL — ABNORMAL HIGH (ref 65–99)
HEMATOCRIT: 26 % — AB (ref 36.0–46.0)
HEMOGLOBIN: 8.8 g/dL — AB (ref 12.0–15.0)
Potassium: 3.7 mmol/L (ref 3.5–5.1)
SODIUM: 138 mmol/L (ref 135–145)

## 2016-09-02 LAB — CBC
HCT: 25.5 % — ABNORMAL LOW (ref 36.0–46.0)
HEMOGLOBIN: 8.5 g/dL — AB (ref 12.0–15.0)
MCH: 29.7 pg (ref 26.0–34.0)
MCHC: 33.3 g/dL (ref 30.0–36.0)
MCV: 89.2 fL (ref 78.0–100.0)
Platelets: 248 10*3/uL (ref 150–400)
RBC: 2.86 MIL/uL — AB (ref 3.87–5.11)
RDW: 16.9 % — ABNORMAL HIGH (ref 11.5–15.5)
WBC: 22.9 10*3/uL — ABNORMAL HIGH (ref 4.0–10.5)

## 2016-09-02 LAB — MAGNESIUM: Magnesium: 2.4 mg/dL (ref 1.7–2.4)

## 2016-09-02 LAB — CARBOXYHEMOGLOBIN
Carboxyhemoglobin: 0.8 % (ref 0.5–1.5)
Methemoglobin: 1.1 % (ref 0.0–1.5)
O2 Saturation: 49.4 %
Total hemoglobin: 8.5 g/dL — ABNORMAL LOW (ref 12.0–16.0)

## 2016-09-02 LAB — PHOSPHORUS: PHOSPHORUS: 2.2 mg/dL — AB (ref 2.5–4.6)

## 2016-09-02 LAB — AMMONIA: AMMONIA: 23 umol/L (ref 9–35)

## 2016-09-02 LAB — PROCALCITONIN: PROCALCITONIN: 4.63 ng/mL

## 2016-09-02 MED ORDER — FENTANYL CITRATE (PF) 100 MCG/2ML IJ SOLN
100.0000 ug | INTRAMUSCULAR | Status: DC | PRN
  Administered 2016-09-02 – 2016-09-10 (×16): 100 ug via INTRAVENOUS
  Filled 2016-09-02 (×18): qty 2

## 2016-09-02 MED ORDER — MIDAZOLAM HCL 2 MG/2ML IJ SOLN
2.0000 mg | INTRAMUSCULAR | Status: AC | PRN
  Administered 2016-09-02 – 2016-09-04 (×3): 2 mg via INTRAVENOUS
  Filled 2016-09-02 (×6): qty 2

## 2016-09-02 MED ORDER — MIDAZOLAM HCL 2 MG/2ML IJ SOLN
INTRAMUSCULAR | Status: AC
  Administered 2016-09-02: 2 mg
  Filled 2016-09-02: qty 4

## 2016-09-02 MED ORDER — PIPERACILLIN-TAZOBACTAM 3.375 G IVPB 30 MIN
3.3750 g | Freq: Four times a day (QID) | INTRAVENOUS | Status: DC
  Administered 2016-09-02 – 2016-09-06 (×17): 3.375 g via INTRAVENOUS
  Filled 2016-09-02 (×18): qty 50

## 2016-09-02 MED ORDER — NOREPINEPHRINE BITARTRATE 1 MG/ML IV SOLN
2.0000 ug/min | INTRAVENOUS | Status: DC
  Administered 2016-09-02: 5 ug/min via INTRAVENOUS
  Administered 2016-09-02: 15 ug/min via INTRAVENOUS
  Administered 2016-09-03: 19 ug/min via INTRAVENOUS
  Filled 2016-09-02 (×3): qty 4

## 2016-09-02 MED ORDER — FENTANYL CITRATE (PF) 100 MCG/2ML IJ SOLN
100.0000 ug | INTRAMUSCULAR | Status: AC | PRN
  Administered 2016-09-02 (×2): 100 ug via INTRAVENOUS
  Administered 2016-09-04: 50 ug via INTRAVENOUS
  Filled 2016-09-02 (×4): qty 2

## 2016-09-02 MED ORDER — FENTANYL CITRATE (PF) 100 MCG/2ML IJ SOLN
INTRAMUSCULAR | Status: AC
  Administered 2016-09-02: 100 ug
  Filled 2016-09-02: qty 4

## 2016-09-02 MED ORDER — MIDAZOLAM HCL 2 MG/2ML IJ SOLN
2.0000 mg | INTRAMUSCULAR | Status: DC | PRN
  Administered 2016-09-02 – 2016-09-11 (×8): 2 mg via INTRAVENOUS
  Filled 2016-09-02 (×8): qty 2

## 2016-09-02 MED ORDER — SODIUM PHOSPHATES 45 MMOLE/15ML IV SOLN
30.0000 mmol | Freq: Once | INTRAVENOUS | Status: AC
  Administered 2016-09-02: 30 mmol via INTRAVENOUS
  Filled 2016-09-02: qty 10

## 2016-09-02 MED ORDER — VANCOMYCIN HCL IN DEXTROSE 750-5 MG/150ML-% IV SOLN
750.0000 mg | INTRAVENOUS | Status: AC
  Administered 2016-09-02 – 2016-09-08 (×7): 750 mg via INTRAVENOUS
  Filled 2016-09-02 (×7): qty 150

## 2016-09-02 NOTE — Progress Notes (Signed)
Nutrition Follow-up  DOCUMENTATION CODES:  Not applicable  INTERVENTION:  Once cortrak position verified, change TF to Vital AF 1.2. Initiate at goal rate of 65 ml/h (1560 ml per day)  to provide 1872 kcals, 117 gm protein, 1265 ml free water daily.  NUTRITION DIAGNOSIS:  Inadequate oral intake related to inability to eat as evidenced by NPO status.  GOAL:  Patient will meet greater than or equal to 90% of their needs  MONITOR:  TF tolerance, Labs, Weight trends, Skin, I & O's  ASSESSMENT:  60 y/o woman with DM2, HTN, HL and noncompliance presented to [facility] ER with infrerior posterior STEMI on 9/12. Taken to cath lab and found to have occluded small to moderate OM-1 branch. Underwent successful PCS with stent x 2 by Dr. Clayborn Bigness. Coronaries otherwise ok. EF 55-60% with severe MR confirmed by echo.   Failed swallow Eval again. Had pulled tube again. Tube replaced. Order placed by RN for imaging to confirm gastric placement.   Hypophosphatemic.  K WDL  Pt is noted to be on CRRT and not receiving intermittent HD.  Currently not fluid overloaded or in need of fluid restriction. Low Phos. Will change to more appropriate TF formula  Labs reviewed   Recent Labs Lab 08/31/16 0336  09/01/16 0500 09/01/16 1600 09/01/16 1613 09/02/16 0350 09/02/16 0825 09/02/16 0846  NA 137  135  < > 137 137 140 138 136  --   K 4.1  4.0  < > 3.6 3.4* 3.4* 3.6 3.8  --   CL 105  102  < > 106 104 103 105 105  --   CO2 25  25  < > 23 22  --  23 19*  --   BUN 19  19  < > 21* 22* 23* 20 19  --   CREATININE 1.30*  1.34*  < > 1.47* 1.43* 1.20* 1.35* 1.30*  --   CALCIUM 8.2*  8.2*  < > 7.7* 8.4*  --  8.2* 7.8*  --   MG 2.5*  --  2.2  --   --  2.4  --   --   PHOS 2.1*  < >  --  2.1*  --   --  2.4* 2.2*  GLUCOSE 169*  169*  < > 164* 101* 98 125* 114*  --   < > = values in this interval not displayed.   Diet Order:  Diet NPO time specified  Skin:  Reviewed, no issues  Last BM:  9/29  Height:   Ht Readings from Last 1 Encounters:  08/18/16 5\' 1"  (1.549 m)   Weight:  Wt Readings from Last 1 Encounters:  09/02/16 148 lb 9.4 oz (67.4 kg)   Ideal Body Weight:  47.7 kg  BMI:  Body mass index is 28.08 kg/m.  Estimated Nutritional Needs:  Kcal:  1700-1900 Protein:  105-120 g (2.2-2.5 g/kg bw) Fluid:  per MD  EDUCATION NEEDS:  No education needs identified at this time  Burtis Junes RD, LDN, [facility] Pager: [phone] 09/02/2016 12:39 PM

## 2016-09-02 NOTE — Procedures (Signed)
Intubation Procedure Note Deborah [name] [id number] 1956-08-30  Procedure: Intubation Indications: endotracheal tube cuff leak  Procedure Details Consent: Risks of procedure as well as the alternatives and risks of each were explained to the (patient/caregiver).  Consent for procedure obtained. Time Out: Verified patient identification, verified procedure, site/side was marked, verified correct patient position, special equipment/implants available, medications/allergies/relevent history reviewed, required imaging and test results available.  Performed  Maximum sterile technique was used including gloves, hand hygiene and mask.  Glidescope 4  After intubation, the ETT cuff was not holding air and the patient had a significant cuff leak with loss of TV. The patient was given 2mg  versed, 138mcg fentanyl and 50mg  rocuronium. Using the Glidescope 4 for visualization, a bougie was passed through the existing tube and the ETT was removed over the bougie. Unfortunately after removal, the bougie became displace. The patient was then intubated with a 7.5 ETT. The tube was visualized passing between the cords to a depth of 22cm at the teeth. + color change, +chest rise, +bilateral breath sounds. No complications and the cuff was noted to hold pressure adequately.   Evaluation Hemodynamic Status: BP stable throughout; O2 sats: stable throughout Patient's Current Condition: stable Complications: No apparent complications Patient did tolerate procedure well. Chest X-ray ordered to verify placement.  CXR: tube position acceptable.  Yisroel Ramming, MD Pulmonary and Critical Care 09/02/16 9:31 PM

## 2016-09-02 NOTE — Progress Notes (Signed)
2 Days Post-Op Procedure(s) (LRB): INSERTION OF DIALYSIS CATHETER LEFT INTERNAL JUGULAR VEIN & INSERTION OF TRIPLE LUMEN RIGHT INTERNAL JUGULAR VEIN (Bilateral) Subjective: Awake, responds to questions, very weak  Objective: Vital signs in last 24 hours: Temp:  [96 F (35.6 C)-97.7 F (36.5 C)] 96.3 F (35.7 C) (09/30 0840) Pulse Rate:  [88-120] 89 (09/30 0800) Cardiac Rhythm: Normal sinus rhythm (09/30 0400) Resp:  [18-32] 31 (09/30 0800) BP: (70-135)/(46-121) 70/46 (09/30 0800) SpO2:  [93 %-100 %] 98 % (09/30 0800) Arterial Line BP: (71-280)/(42-276) 280/276 (09/30 0800) Weight:  [148 lb 9.4 oz (67.4 kg)] 148 lb 9.4 oz (67.4 kg) (09/30 0300)  Hemodynamic parameters for last 24 hours: CVP:  [11 mmHg-21 mmHg] 21 mmHg  Intake/Output from previous day: 09/29 0701 - 09/30 0700 In: 1540.9 [I.V.:826.3; NG/GT:314.6; IV Piggyback:400] Out: 3232 [Emesis/NG output:50] Intake/Output this shift: No intake/output data recorded.  General appearance: cooperative and no distress Neurologic: generalized weakness Heart: regular rate and rhythm Lungs: rhonchi bilaterally Abdomen: normal findings: soft, non-tender Wound: clean and dry  Lab Results:  Recent Labs  09/01/16 0500 09/01/16 1613 09/02/16 0350  WBC 20.2*  --  22.9*  HGB 8.1* 9.9* 8.5*  HCT 25.0* 29.0* 25.5*  PLT 189  --  248   BMET:  Recent Labs  09/02/16 0350 09/02/16 0825  NA 138 136  K 3.6 3.8  CL 105 105  CO2 23 19*  GLUCOSE 125* 114*  BUN 20 19  CREATININE 1.35* 1.30*  CALCIUM 8.2* 7.8*    PT/INR: No results for input(s): LABPROT, INR in the last 72 hours. ABG    Component Value Date/Time   PHART 7.530 (H) 09/02/2016 0359   HCO3 24.6 09/02/2016 0359   TCO2 26 09/02/2016 0359   ACIDBASEDEF 3.0 (H) 08/26/2016 1615   O2SAT 49.4 09/02/2016 0411   CBG (last 3)   Recent Labs  09/01/16 2321 09/02/16 0331 09/02/16 0835  GLUCAP 103* 127* 100*    Assessment/Plan: S/P Procedure(s) (LRB): INSERTION  OF DIALYSIS CATHETER LEFT INTERNAL JUGULAR VEIN & INSERTION OF TRIPLE LUMEN RIGHT INTERNAL JUGULAR VEIN (Bilateral) -CV- hypotensive and co-ox only 49 on milrinone  Agree with Dr. Claris Gladden plan to start levophed RESP_ extubated yesterday. Limited with IS, some rhonchi- plan per CCM RENAL_ on CVVH but unable to pull volume due to hypotension ENDO- CBG well controlled will check cortisol Deconditioning Failed swallow - feeding tube to be replaced   LOS: 15 days    Deborah [name] 09/02/2016

## 2016-09-02 NOTE — Progress Notes (Signed)
PT Cancellation Note  Patient Details Name: Deborah [name] MRN: [id number] DOB: 26-Aug-1956   Cancelled Treatment:    Reason Eval/Treat Not Completed: Medical issues which prohibited therapy.  RN reports not medically ready today.  PT to check back on Monday 09/04/16.  Thanks,    Barbarann Ehlers. [place], [place], DPT [phone]   09/02/2016, 11:33 AM

## 2016-09-02 NOTE — Progress Notes (Signed)
Patient ID: Deborah [name], female   DOB: 1956-11-17, 60 y.o.   MRN: [id number]   60 y/o woman with DM2, HTN, HL and noncompliance presented to [facility] ER with infrerior posterior STEMI on 9/12. Taken to cath lab and found to have occluded small to moderate OM-1 branch. Underwent successful PCS with stent x 2 by Dr. Clayborn Bigness. Coronaries otherwise ok. EF 55-60% with severe MR confirmed by echo.   Post-cath developed respiratory failure and required intubation. Developed shock and renal failure with peak creatinine 3.4 (1.6 on admit). Trialysis catheter placed.   Patient persistently acidotic with concern for ischemic MR and transferred here. En route develop severe hypotension and norepinephrine started.   Brought to cath lab 9/15 and TEE showed normal LV (EF 60-65%) and normal RV function with ruptured posterior papillary muscle with severe posterior MR.  Underwent placement of Swan and IABP.   Treated for group B Strep PNA.   S/P MVR 08/23/16.  IABP pulled 9/22.   Echo 9/23  RV and LV are normal (EF 60%) but appeared underfilled   Extubated yesterday.  This morning, failed swallow study.  She is lethargic and hypotensive with SBP in 70s.  She will awaken to voice but does not follow commands/answer questions.   Remains on milrinone 0.25. Co-ox 49%.  Afebrile but WBCs climbing to 22K.  CVP 13, ongoing CVVH but not pulling fluid currently because of hypotension.    Scheduled Meds: . atorvastatin  20 mg Oral q1800  . chlorhexidine  15 mL Mouth Rinse BID  . Chlorhexidine Gluconate Cloth  6 each Topical Daily  . clopidogrel  75 mg Oral Daily  . famotidine (PEPCID) IV  20 mg Intravenous Q12H  . feeding supplement (NEPRO CARB STEADY)  1,000 mL Per Tube Q24H  . feeding supplement (PRO-STAT SUGAR FREE 64)  30 mL Per Tube TID  . insulin aspart  0-24 Units Subcutaneous Q4H  . insulin detemir  10 Units Subcutaneous BID  . mouth rinse  15 mL Mouth Rinse q12n4p  . mupirocin ointment  1 application  Nasal BID  . sodium chloride flush  3 mL Intravenous Q12H   Continuous Infusions: . sodium chloride Stopped (08/27/16 0900)  . sodium chloride    . sodium chloride 10 mL/hr at 09/02/16 0700  . amiodarone 30 mg/hr (09/02/16 0700)  . heparin 10,000 units/ 20 mL infusion syringe 1,500 Units/hr (09/02/16 0820)  . lidocaine Stopped (08/29/16 0900)  . milrinone 0.252 mcg/kg/min (09/02/16 0700)  . norepinephrine (LEVOPHED) Adult infusion    . dialysis replacement fluid (prismasate) 600 mL/hr at 09/02/16 0222  . dialysis replacement fluid (prismasate) 200 mL/hr at 09/01/16 1318  . dialysate (PRISMASATE) 1,000 mL/hr at 09/02/16 0858   PRN Meds:.sodium chloride, Place/Maintain arterial line **AND** sodium chloride, fentaNYL (SUBLIMAZE) injection, heparin, HYDROcodone-acetaminophen, LORazepam, ondansetron (ZOFRAN) IV, sodium chloride, sodium chloride flush    Vitals:   09/02/16 0630 09/02/16 0700 09/02/16 0800 09/02/16 0840  BP: 90/65 (!) 87/57 (!) 70/46   Pulse: 92 88 89   Resp: (!) 32 (!) 31 (!) 31   Temp:    (!) 96.3 F (35.7 C)  TempSrc:    Axillary  SpO2: 100% 100% 98%   Weight:      Height:        Intake/Output Summary (Last 24 hours) at 09/02/16 0912 Last data filed at 09/02/16 0700  Gross per 24 hour  Intake          1366.68 ml  Output  2917 ml  Net         -1550.32 ml    LABS: Basic Metabolic Panel:  Recent Labs  09/01/16 0500 09/01/16 1600 09/01/16 1613 09/02/16 0350 09/02/16 0846  NA 137 137 140 138  --   K 3.6 3.4* 3.4* 3.6  --   CL 106 104 103 105  --   CO2 23 22  --  23  --   GLUCOSE 164* 101* 98 125*  --   BUN 21* 22* 23* 20  --   CREATININE 1.47* 1.43* 1.20* 1.35*  --   CALCIUM 7.7* 8.4*  --  8.2*  --   MG 2.2  --   --  2.4  --   PHOS  --  2.1*  --   --  2.2*   Liver Function Tests:  Recent Labs  09/01/16 0500 09/01/16 1600 09/02/16 0350  AST 37  --  42*  ALT 91*  --  86*  ALKPHOS 143*  --  121  BILITOT 1.2  --  1.6*  PROT 5.8*   --  6.1*  ALBUMIN 2.6* 2.8* 2.6*   No results for input(s): LIPASE, AMYLASE in the last 72 hours. CBC:  Recent Labs  09/01/16 0500 09/01/16 1613 09/02/16 0350  WBC 20.2*  --  22.9*  HGB 8.1* 9.9* 8.5*  HCT 25.0* 29.0* 25.5*  MCV 89.6  --  89.2  PLT 189  --  248   Cardiac Enzymes: No results for input(s): CKTOTAL, CKMB, CKMBINDEX, TROPONINI in the last 72 hours. BNP: Invalid input(s): POCBNP D-Dimer: No results for input(s): DDIMER in the last 72 hours. Hemoglobin A1C: No results for input(s): HGBA1C in the last 72 hours. Fasting Lipid Panel: No results for input(s): CHOL, HDL, LDLCALC, TRIG, CHOLHDL, LDLDIRECT in the last 72 hours. Thyroid Function Tests: No results for input(s): TSH, T4TOTAL, T3FREE, THYROIDAB in the last 72 hours.  Invalid input(s): FREET3 Anemia Panel: No results for input(s): VITAMINB12, FOLATE, FERRITIN, TIBC, IRON, RETICCTPCT in the last 72 hours.  RADIOLOGY: Dg Chest 1 View  Result Date: 08/17/2016 CLINICAL DATA:  Endotracheal tube placement, OG tube placement EXAM: CHEST 1 VIEW COMPARISON:  Portable chest x-ray of 08/17/2016 FINDINGS: The tip of the endotracheal tube is within the right mainstem bronchus and needs to be withdrawn by approximately 5 cm per OG tube extends below the hemidiaphragm. Bilateral primarily perihilar airspace disease again is noted most consistent with edema or possibly aspiration. A small left pleural effusion is noted. Right central venous line is unchanged. IMPRESSION: 1. Endotracheal tip is within the right mainstem bronchus and needs to be withdrawn by approximately 5 cm. 2. OG tube extends below the hemidiaphragm. 3. Little change in bilateral airspace disease with small left pleural effusion. Electronically Signed   By: Ivar Drape M.D.   On: 08/17/2016 08:05   Dg Chest 1 View  Result Date: 08/17/2016 CLINICAL DATA:  Acute respiratory failure. EXAM: CHEST 1 VIEW COMPARISON:  08/15/2016 FINDINGS: Type of the right  central line in the mid SVC. The bilateral perihilar opacities are becoming more confluent, air bronchograms non noted in the right suprahilar region. Stable cardiomegaly. Increasing left lung base atelectasis. Small pleural effusion on prior exam is not well visualized, suspect unchanged. No pneumothorax. IMPRESSION: Coalescing perihilar bilateral opacities, suspicious for increasing pulmonary edema. Increasing left lung base atelectasis. Electronically Signed   By: Jeb Levering M.D.   On: 08/17/2016 03:15   Dg Chest 1 View  Result Date: 08/15/2016 CLINICAL DATA:  Respiratory failure EXAM: CHEST 1 VIEW COMPARISON:  January 22, 2010 FINDINGS: The mediastinal contour is normal. The heart size is enlarged. The diffuse airspace opacities identified throughout bilateral lungs. There is probably a minimal left pleural effusion. The osseous structures are normal. IMPRESSION: Congestive heart failure. Electronically Signed   By: Abelardo Diesel M.D.   On: 08/15/2016 19:25   Dg Abd 1 View  Result Date: 08/17/2016 CLINICAL DATA:  Endotracheal tube placement, OG tube placement EXAM: ABDOMEN - 1 VIEW COMPARISON:  CT abdomen pelvis of 12/10/2010 FINDINGS: The OG tube tip extends into the body the stomach. The bowel gas pattern is nonspecific. Contrast is noted being excreted by the kidneys and within the urinary bladder secondary to recent CT of the abdomen pelvis. The bowel gas pattern is nonspecific. There may be a small left pleural effusion present. IMPRESSION: OG tube tip extends into the body the stomach. The bowel gas pattern is nonspecific. Electronically Signed   By: Ivar Drape M.D.   On: 08/17/2016 08:09   Ct Head Wo Contrast  Result Date: 08/28/2016 CLINICAL DATA:  Status post intubation. EXAM: CT HEAD WITHOUT CONTRAST TECHNIQUE: Contiguous axial images were obtained from the base of the skull through the vertex without intravenous contrast. COMPARISON:  CT head dated 01/22/2010. FINDINGS: Brain:  There is mild generalized parenchymal atrophy with commensurate dilatation of the ventricles and sulci. Small old lacunar infarct noted within the right basal ganglia region. There is no mass, hemorrhage, edema or other evidence of acute parenchymal abnormality. No extra-axial hemorrhage. Vascular: There are chronic calcified atherosclerotic changes of the large vessels at the skull base. No unexpected hyperdense vessel. Skull: Normal. Negative for fracture or focal lesion. Sinuses/Orbits: No acute finding. Other: None IMPRESSION: No acute findings.  No intracranial mass, hemorrhage or edema. Electronically Signed   By: Franki Cabot M.D.   On: 08/28/2016 19:27   Dg Chest Port 1 View  Result Date: 09/02/2016 CLINICAL DATA:  Secretions from being intubated. Difficulty breathing. EXAM: PORTABLE CHEST 1 VIEW COMPARISON:  August 31, 2016 FINDINGS: Increasing layering effusion and underlying atelectasis on the right greater than the left. Cardiomegaly. Stable double lumen subclavian catheter. No pneumothorax. No pulmonary nodules or masses. Probable mild edema. No other interval changes. IMPRESSION: Mild edema with small bilateral effusions and underlying atelectasis, worsened in the interval. Electronically Signed   By: Dorise Bullion III M.D   On: 09/02/2016 08:11   Dg Chest Port 1 View  Result Date: 08/31/2016 CLINICAL DATA:  Status post dialysis catheter insertion. EXAM: PORTABLE CHEST 1 VIEW COMPARISON:  08/23/2016 FINDINGS: There has been interval placement of dual lumen large bore dialysis catheter from left internal jugular approach. The catheter terminates in the expected location of the right atrium. The rest of the of the support apparatus and postsurgical changes are stable. The cardiac silhouette is mildly enlarged. Mediastinal contours appear intact. There is no evidence of pneumothorax. Mild pulmonary edema. Bilateral small pleural effusions cannot be excluded. Osseous structures are without  acute abnormality. Soft tissues are grossly normal. IMPRESSION: Post insertion of large bore dual lumen central venous catheter with tip terminating in the expected location of the right atrium. Support apparatus otherwise stable. Mild pulmonary edema.  Possible bilateral small pleural effusions. Electronically Signed   By: Fidela Salisbury M.D.   On: 08/31/2016 17:31   Dg Chest Port 1 View  Result Date: 08/31/2016 CLINICAL DATA:  Mitral valve replacement. EXAM: PORTABLE CHEST 1 VIEW COMPARISON:  08/30/2016 FINDINGS: Endotracheal tube, NG  tube, feeding tube, dual lumen left IJ catheter, right IJ catheter in stable position. Prior cardiac valve replacement. Cardiomegaly with bilateral pulmonary infiltrates consistent pulmonary edema again noted. Small right pleural effusion. IMPRESSION: 1. Lines and tubes in stable position. 2. Prior cardiac valve replacement. Cardiomegaly with bilateral pulmonary infiltrates consistent with pulmonary edema again noted. Small right pleural effusion. No significant interim change. Electronically Signed   By: Marcello Moores  Register   On: 08/31/2016 07:39   Dg Chest Port 1 View  Result Date: 08/30/2016 CLINICAL DATA:  60 year old female status post mitral valve replacement. Initial encounter. EXAM: PORTABLE CHEST 1 VIEW COMPARISON:  08/29/2016 and earlier. FINDINGS: Portable AP semi upright view at 0628 hours. Stable endotracheal tube. Visible enteric tubes appear stable. Stable dual-lumen left IJ approach central line. Stable right subclavian approach central line. Stable cardiac size and mediastinal contours. Continued veiling opacity at both lung bases an diffuse pulmonary vascular congestion. No pneumothorax. Stable ventilation since yesterday. IMPRESSION: 1.  Stable lines and tubes. 2. Stable ventilation since yesterday with pulmonary interstitial edema superimposed on bilateral pleural effusions and lower lobe collapse or consolidation. Electronically Signed   By: Genevie Ann  M.D.   On: 08/30/2016 07:34   Dg Chest Port 1 View  Result Date: 08/29/2016 CLINICAL DATA:  Hypoxia EXAM: PORTABLE CHEST 1 VIEW COMPARISON:  August 27, 2016 FINDINGS: Endotracheal tube tip is 3.7 cm above the carina. Central catheter tip is in the superior vena cava. Nasogastric and feeding tube tips are below the diaphragm. There is a mediastinal drain and right chest tube. No pneumothorax. There are bilateral pleural effusions with patchy airspace consolidation in the left base and somewhat generalized interstitial edema. Edema and effusions have increased compared to recent study. Consolidation in the left base is stable. Cardiomegaly is stable. The pulmonary vascular is normal. Patient is status post mitral valve replacement. IMPRESSION: Tube and catheter positions as described without pneumothorax. Persistent consolidation left base. New pleural effusions with interstitial edema. Suspect a degree of congestive heart failure. Electronically Signed   By: Lowella Grip III M.D.   On: 08/29/2016 08:28   Dg Chest Port 1 View  Result Date: 08/27/2016 CLINICAL DATA:  60 year old female with a history of mitral valve repair EXAM: PORTABLE CHEST 1 VIEW COMPARISON:  08/27/2016, 08/26/2016, 08/25/2016 FINDINGS: Cardiomediastinal silhouette unchanged. Surgical changes of prior median sternotomy and mitral valve annuloplasty. Unchanged position of right-sided thoracostomy tube, gastric tube, right subclavian central line. Unchanged position of the endotracheal tube. Unchanged left-sided IJ central venous catheter. Unchanged position of mediastinal drain and epicardial pacing leads. Interval removal of right IJ sheath and Swan-Ganz catheter. Minimal interstitial airspace opacities at the lung bases. Blunting of left costophrenic angle with pleural parenchymal thickening. No pneumothorax. IMPRESSION: Interval removal of right IJ sheath and Swan-Ganz catheter. Mild airspace disease at the bases with likely trace  left pleural effusion. Unchanged endotracheal tube, gastric tube, right subclavian central line, left IJ catheter, mediastinal/pleural drains, and epicardial pacing leads. Surgical changes of median sternotomy and mitral valve annuloplasty. Signed, Dulcy Fanny. Earleen Newport, DO [facility] Electronically Signed   By: Corrie Mckusick D.O.   On: 08/27/2016 17:12   Dg Chest Port 1 View  Result Date: 08/26/2016 CLINICAL DATA:  Status post cardiac surgery and valve replacement. EXAM: PORTABLE CHEST 1 VIEW COMPARISON:  08/25/2016 FINDINGS: Changes from the recent cardiac surgery and mitral valve replacement are stable from most recent prior exam. Cardiac silhouette is mildly enlarged. There is no mediastinal  widening. Mild lower lung zone hazy opacity is noted consistent with a combination of small pleural effusions and atelectasis. Mild interstitial thickening is noted centrally which has improved. There was no overt pulmonary edema. No pneumothorax. Endotracheal tube, right internal jugular Swan-Ganz catheter, left internal jugular dual-lumen central venous catheter, nasal/orogastric tube, right chest tube and mediastinal tube are stable in well positioned. Right subclavian central venous line catheter tip projects in the right atrium, also stable. IMPRESSION: 1. Improved lung aeration when compared the prior exam consistent with improved pulmonary edema. 2. Lung base opacity noted consistent with small effusions and atelectasis. 3. No pneumothorax.  No mediastinal widening. 4. Support apparatus is stable. Electronically Signed   By: Lajean Manes M.D.   On: 08/26/2016 08:03   Dg Chest Port 1 View  Result Date: 08/25/2016 CLINICAL DATA:  Status post mitral valve replacement EXAM: PORTABLE CHEST 1 VIEW COMPARISON:  08/24/2016 FINDINGS: Cardiac shadow is stable. Intra-aortic balloon pump is again noted just below the aortic knob. Changes of mitral valve replacement  are again seen. Swan-Ganz catheter is noted in the pulmonary outflow tract. Temporary dialysis catheter is noted via the left jugular approach. Right subclavian central line is noted as well. Endotracheal tube and mediastinal drain are again noted. The degree of vascular congestion has improved somewhat in the interval from the prior exam although persists. Bilateral pleural effusions right greater than left are noted. No focal confluent infiltrate is seen. IMPRESSION: Persistent but improved vascular congestion with bilateral effusions. Tubes and lines as described. Electronically Signed   By: Inez Catalina M.D.   On: 08/25/2016 08:40   Dg Chest Port 1 View  Result Date: 08/24/2016 CLINICAL DATA:  Mitral valve replacement, chest tube EXAM: PORTABLE CHEST 1 VIEW COMPARISON:  08/23/2016 FINDINGS: PA support devices including right chest tube, endotracheal tube Swan-Ganz catheter are unchanged. No pneumothorax. Diffuse interstitial prominence throughout the lungs is increased, likely mild edema. Mild cardiomegaly. Left base atelectasis crash that left base atelectasis has increased. IMPRESSION: Increasing interstitial prominence throughout the lungs, likely worsening interstitial edema. Increasing left base atelectasis. Electronically Signed   By: Rolm Baptise M.D.   On: 08/24/2016 08:41   Dg Chest Portable 1 View  Result Date: 08/23/2016 CLINICAL DATA:  Hypoxia EXAM: PORTABLE CHEST 1 VIEW COMPARISON:  Study obtained earlier in the day FINDINGS: Endotracheal tube tip is 2.6 cm above the carina. Central catheter placed from the right side has its tip in the right atrium slightly beyond the cavoatrial junction. Swan-Ganz catheter tip is in the proximal right main pulmonary artery. Left jugular catheter tip is in the superior vena cava. Nasogastric tube is no longer apparent. Temporary pacemaker wires are attached to the right heart. No pneumothorax. There is perihilar and central interstitial edema. No airspace  consolidation. Heart is mildly enlarged. There is mild pulmonary venous hypertension. Prosthetic cardiac valve present. No adenopathy evident. IMPRESSION: Tube and catheter positions as described without pneumothorax. Note that the new catheter placed on the right side has its tip in the right atrium slightly beyond the cavoatrial junction. There is interstitial edema with mild cardiomegaly and mild pulmonary venous hypertension. No airspace consolidation appreciable. Electronically Signed   By: Lowella Grip III M.D.   On: 08/23/2016 15:34   Dg Chest Port 1 View  Result Date: 08/23/2016 CLINICAL DATA:  Respiratory failure. Acute on chronic diastolic heart failure. Cardiogenic shock. EXAM: PORTABLE CHEST 1 VIEW COMPARISON:  08/22/2016, 08/21/2016 and 08/20/2016 and 08/15/2016 FINDINGS: Endotracheal tube, NG tube and right  and left jugular vein central catheters appear in good position, unchanged. Intra aortic balloon pump in place. Heart size and pulmonary vascularity are now normal. Small bilateral pleural effusions. Slight haziness at the lung bases is most likely secondary to the effusions although I think there is an element of slight residual pulmonary edema as well. IMPRESSION: Improving pulmonary edema.  Persistent small bilateral effusions. Electronically Signed   By: Lorriane Shire M.D.   On: 08/23/2016 07:44   Dg Chest Port 1 View  Result Date: 08/22/2016 CLINICAL DATA:  Heart failure.  Balloon pump advanced. EXAM: PORTABLE CHEST 1 VIEW COMPARISON:  08/22/2016 at 4:50 a.m. FINDINGS: Metallic tip of the balloon pump projects over the aortic knob, well positioned. Endotracheal tube, right and left internal jugular central venous lines, femoral a placed Swan-Ganz catheter and nasal/orogastric tube are stable in well positioned. Lung aeration appears mildly improved although the difference may be technical only. There still vascular congestion and interstitial and hazy airspace opacity consistent  with asymmetric pulmonary edema. Small pleural effusions are noted. IMPRESSION: 1. Intra-aortic balloon pump tip projects over the aortic knob, well positioned. 2. Possible mild improvement in lung aeration since the earlier study although the difference may be technical only. No other evidence of a change. Electronically Signed   By: Lajean Manes M.D.   On: 08/22/2016 16:34   Dg Chest Port 1 View  Result Date: 08/22/2016 CLINICAL DATA:  Respiratory failure, shortness of Breath EXAM: PORTABLE CHEST 1 VIEW COMPARISON:  08/21/2016 FINDINGS: Support devices are stable. Cardiomegaly. Bilateral perihilar and lower lobe opacities throughout the lungs have increased slightly since prior study, likely mild edema. Suspect small layering effusions. IMPRESSION: Mild interval worsening of pulmonary edema/ CHF. Small layering effusions. Electronically Signed   By: Rolm Baptise M.D.   On: 08/22/2016 07:30   Dg Chest Port 1 View  Result Date: 08/21/2016 CLINICAL DATA:  Acute hypoxemic respiratory failure EXAM: PORTABLE CHEST 1 VIEW COMPARISON:  08/20/2016 FINDINGS: Support devices are stable. Endotracheal tube remains at the level of the carina. Borderline heart size. Bilateral airspace opacities are again noted, likely edema. Findings stable or slightly worsened since prior study. Possible small right effusion. IMPRESSION: Slight interval worsening in pulmonary edema pattern. Small right effusion. Endotracheal tube remains at the level of the carina and could be retracted 2-3 cm for optimal positioning. Electronically Signed   By: Rolm Baptise M.D.   On: 08/21/2016 07:40   Dg Chest Port 1 View  Result Date: 08/20/2016 CLINICAL DATA:  Pt was hypotensive with respiratory failure x a few days ago. Hx of DM, and HTN. Pt had a cardiac cath on 08-15-16. EXAM: PORTABLE CHEST 1 VIEW COMPARISON:  08/19/2016 FINDINGS: Endotracheal to appears low at the level of the carina. LEFT and RIGHT central venous line are unchanged.  Swan-Ganz catheter from an inferior approach unchanged. There is some improvement aeration lung bases. Mild pulmonary edema remains. No pneumothorax. IMPRESSION: 1. Endotracheal tube appears low at the level the carina. 2. Stable support apparatus. 3. Slight improved aeration lung bases with persistent mild edema. Electronically Signed   By: Suzy Bouchard M.D.   On: 08/20/2016 07:58   Dg Chest Port 1 View  Result Date: 08/19/2016 CLINICAL DATA:  Pulmonary edema EXAM: PORTABLE CHEST 1 VIEW COMPARISON:  08/18/2016 FINDINGS: External pad artifact. Endotracheal tube, central venous lines, and NG tube unchanged. Swan-Ganz catheter and intra aortic balloon pump unchanged. Stable cardiac silhouette. Mild central venous pulmonary congestion. No overt pulmonary edema. No pneumothorax.  IMPRESSION: 1. Stable support apparatus. 2. Central venous congestion.  No significant change. Electronically Signed   By: Suzy Bouchard M.D.   On: 08/19/2016 07:55   Dg Chest Port 1 View  Result Date: 08/18/2016 CLINICAL DATA:  Placement of femoral Swan-Ganz catheter and intra-aortic balloon pump catheter. EXAM: PORTABLE CHEST 1 VIEW 4:14 p.m.: COMPARISON:  Portable chest x-ray earlier today 3:37 p.m. and previously. FINDINGS: Femoral Swan-Ganz catheter tip projects at the expected location of the right middle lobe pulmonary artery. Intra-aortic balloon pump catheter tip projects over the proximal descending thoracic aorta. Endotracheal tube tip now projects approximately 2-3 cm above the carina. Right jugular central venous catheter tip projects over the upper SVC. Left jugular central venous catheter tip projects over the lower SVC. Nasogastric tube courses below the diaphragm into the stomach. Improved aeration in the left lower lobe since the most recent examination 45 minutes earlier, though streaky opacities persist. Improved perihilar airspace pulmonary edema in both lungs. No new pulmonary parenchymal abnormalities.  IMPRESSION: 1. Swan-Ganz catheter tip projects at the expected location of the right middle lobe pulmonary artery. 2. Intra-aortic balloon pump catheter tip projects over the proximal descending thoracic aorta. 3. Remaining support apparatus satisfactory. 4. Improved aeration in both lungs since the examination performed 45 minutes ago, although streaky left lower lobe atelectasis and/or pneumonia and mild perihilar airspace pulmonary edema persists. 5. No new abnormalities. Electronically Signed   By: Evangeline Dakin M.D.   On: 08/18/2016 16:43   Dg Chest Port 1 View  Result Date: 08/18/2016 CLINICAL DATA:  ST-elevation myocardial infarction and cardiogenic shock. EXAM: PORTABLE CHEST 1 VIEW COMPARISON:  Yesterday FINDINGS: Endotracheal tube tip 1 cm above the carina. Central line on the right and dialysis catheter on the left with tips at the lower SVC. Orogastric tube reaches the stomach at least. Stable borderline heart size. Worsening aeration at the left base with newly obscured diaphragm. Perihilar airspace disease. IMPRESSION: 1. Lower endotracheal tube with tip 1 cm above the carina. 2. New atelectasis or consolidation in the left lower lobe with small effusion. 3. Pulmonary edema. Electronically Signed   By: Monte Fantasia M.D.   On: 08/18/2016 07:02   Dg Chest Port 1 View  Result Date: 08/17/2016 CLINICAL DATA:  Central line placement EXAM: PORTABLE CHEST 1 VIEW COMPARISON:  Earlier same day FINDINGS: Endotracheal tube is been withdrawn with the tip 2.5 cm proximal to the carina. Right internal jugular central line remains in place with its tip in the SVC above the right atrium. New dual-lumen left internal jugular central line has its tip in the SVC above the right atrium. No pneumothorax. Bilateral bat wing airspace filling is seen consistent with acute edema or acute airspace filling due to other etiologies. No effusions. No collapse. IMPRESSION: Left IJ central line well position with tip in  the SVC above the right atrium. No pneumothorax. Endotracheal tube repositioned, well positioned with tip 2.5 cm above the carina. Persistent worsen bat wing edema or airspace filling pattern. Electronically Signed   By: Nelson Chimes M.D.   On: 08/17/2016 10:35   Dg Chest Port 1 View  Result Date: 08/15/2016 CLINICAL DATA:  Central line placement EXAM: PORTABLE CHEST 1 VIEW COMPARISON:  August 15, 2016 7:04 p.m. FINDINGS: Right central venous line is identified with distal tip in superior vena cava. There is no pneumothorax. The heart size mildly enlarged. Diffuse airspace opacities identified throughout both lungs. The bones are unchanged. IMPRESSION: Right central venous line distal tip  in the superior vena cava. There is no pneumothorax. Pulmonary edema unchanged compared prior exam. Electronically Signed   By: Abelardo Diesel M.D.   On: 08/15/2016 21:10   Dg Chest Port 1v Same Day  Result Date: 08/27/2016 CLINICAL DATA:  Post MITRAL VALVE REPAIR (MVR), INTRAOPERATIVE TRANSESOPHAGEAL ECHOCARDIOGRAM, ENDOVEIN HARVEST OF GREATER SAPHENOUS VEIN, CENTRAL LINE INSERTION on 08/23/2016. H/O diabetes, hypertension. Pt is intubated and on 24/7 dialysis. Non smoker EXAM: PORTABLE CHEST 1 VIEW COMPARISON:  08/26/2016 FINDINGS: right IJ Swan-Ganz catheter unchanged. There is also a left-sided internal jugular dialysis catheter which terminates at the mid SVC. Endotracheal tube terminates 3.3 cm above carina.Nasogastric and feeding tubes extend beyond the inferior aspect of the film. Mediastinal drain and right-sided chest tube. Cardiomegaly accentuated by AP portable technique. No pleural effusion or pneumothorax. Persistent mild to moderate interstitial edema. Bibasilar airspace disease. Right-sided subclavian line is also unchanged. IMPRESSION: No significant change since the prior exam. Cardiomegaly with mild to moderate interstitial edema and bibasilar airspace disease. Stable support apparatus, without  pneumothorax. Electronically Signed   By: [name] Miyamoto M.D.   On: 08/27/2016 09:06   Dg Abd Portable 1v  Result Date: 09/01/2016 CLINICAL DATA:  Feeding tube placement EXAM: PORTABLE ABDOMEN - 1 VIEW COMPARISON:  Three days ago FINDINGS: Feeding tube tip at the level the distal duodenum. A nasogastric tube is been removed. Central line tips overlaps the upper right atrium. Cardiomegaly. Mitral valve repair. Hazy basilar opacity accentuated by motion artifact. Visualized bowel gas pattern is nonobstructive. IMPRESSION: Feeding tube tip is at the distal duodenum. Electronically Signed   By: Monte Fantasia M.D.   On: 09/01/2016 15:04   Dg Abd Portable 1v  Result Date: 08/29/2016 CLINICAL DATA:  Status post feeding tube placement EXAM: PORTABLE ABDOMEN - 1 VIEW COMPARISON:  Supine abdominal radiograph of August 25, 2016 FINDINGS: The tip of the feeding tube projects to the left of midline. This may have read curved upon itself and lie in the gastric body but may also lie in the fourth portion of the duodenum or at the proximal portion of the jejunum. The bowel gas pattern is unremarkable. IMPRESSION: The feeding tube tip lies in likely in the distal duodenum just proximal to the jejunum but the possibility of it having re- curved upon itself and still lie in the gastric body is raised. A cross-table lateral radiograph may be useful. Electronically Signed   By: David  Martinique M.D.   On: 08/29/2016 12:34   Dg Abd Portable 1v  Result Date: 08/25/2016 CLINICAL DATA:  Feeding tube placement. EXAM: PORTABLE ABDOMEN - 1 VIEW COMPARISON:  08/22/2016. FINDINGS: Feeding tube noted with its tip projected over the distal stomach/ proximal duodenum. NG tube noted in stable position with tip over the stomach. Left femoral catheter noted. Right femoral sheath in stable position . Aortic balloon pump tip noted L3. No bowel distention. IMPRESSION: Interim placement of feeding tube, its tip is projected over the distal  stomach/ duodenum. Remaining lines and tubes including aortic balloon pump in stable position . Electronically Signed   By: Marcello Moores  Register   On: 08/25/2016 12:50   Dg Abd Portable 1v  Result Date: 08/22/2016 CLINICAL DATA:  Balloon pump position EXAM: PORTABLE ABDOMEN - 1 VIEW COMPARISON:  Chest x-ray 08/22/2016.  KUB 08/17/2016 FINDINGS: Right femoral Swan-Ganz catheter extends above the diaphragm . Intra-aortic balloon pump marker appears overlying the L3 vertebral body in previously was in the thoracic aorta. Left femoral venous catheter in the left  iliac vein. NG tube in the stomach. Normal bowel gas pattern. IMPRESSION: Intra-aortic balloon pump marker  overlies the L3 vertebral body. Electronically Signed   By: Franchot Gallo M.D.   On: 08/22/2016 13:12   Dg Abd Portable 1v  Result Date: 08/19/2016 CLINICAL DATA:  Acidosis EXAM: PORTABLE ABDOMEN - 1 VIEW COMPARISON:  08/17/2016 FINDINGS: No dilated loops large or small bowel. NG tube in stomach. Swan-Ganz catheter noted LEFT basilar atelectasis. The kidneys are dense suggesting retained IV contrast . IMPRESSION: 1. Normal bowel-gas pattern. 2. Retained contrast in the kidneys consistent consistent with acute kidney injury. Electronically Signed   By: Suzy Bouchard M.D.   On: 08/19/2016 08:02   Dg Fluoro Guide Cv Line-no Report  Result Date: 08/31/2016 CLINICAL DATA:  FLOURO GUIDE CV LINE Fluoroscopy was utilized by the requesting physician.  No radiographic interpretation.   US Abdomen Limited Ruq  Result Date: 08/26/2016 CLINICAL DATA:  Acute onset of abnormal LFTs.  Initial encounter. EXAM: US ABDOMEN LIMITED - RIGHT UPPER QUADRANT COMPARISON:  None. FINDINGS: Gallbladder: Gallbladder wall thickening is noted, measuring up to 5 mm. Vague internal echoes are seen within the gallbladder, suggestive of underlying sludge. No definite stones are identified. No pericholecystic fluid is seen. Evaluation for ultrasonographic Murphy's sign is not  possible, as the patient is sedated and on ventilation. Common bile duct: Diameter: 0.5 cm, within normal limits in caliber. Liver: No focal lesion identified. Within normal limits in parenchymal echogenicity. The left hepatic lobe is not assessed as the patient has overlying bandages, due to recent CABG and mitral valve replacement. A small right-sided pleural effusion is noted. IMPRESSION: 1. Gallbladder wall thickening noted. Suggestion of underlying sludge filling the gallbladder. No definite evidence of cholelithiasis. Findings may reflect mild chronic inflammation. No pericholecystic fluid is seen to suggest acute cholecystitis. 2. Small right-sided pleural effusion noted. 3. Liver grossly unremarkable in appearance, though the left hepatic lobe is not imaged on this study. Electronically Signed   By: Garald Balding M.D.   On: 08/26/2016 18:29    PHYSICAL EXAM General: Intubated/sedated Neck: RIJ TLC LIJ trialysis  Lungs: Clear anteriorly CV: Nondisplaced PMI.  Heart regular S1/S2, no S3/S4, 1/6 SEM USB.   Abdomen: Soft, ND, no HSM. No bruits or masses. +BS  Neurologic: Sedated.  Not responding to verbal stimuli.  Extremities: No clubbing or cyanosis. 1-2+ edema GU: Foley    TELEMETRY: NSR  Assessment:   1. Cardiogenic shock: Due to acute severe MR. EF 70% on 9/25 echo.  2. CAD: Inferior posterior STEMI on 9/13 with DES x 2 to OM-1      --Plavix restarted on 9/22 3. 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     --s/p MVR 4. AKI on CKD stage III - due to ATN likely from cardiogenic shock + contrast with cath. Has required CVVH.  5. Acute respiratory failure: Pulmonary edema.  6. DM2 7. Suspect component of septic shock: Possible PNA, group B Strep in sputum culture.  8. Anemia 9. PVCs   10. Shock liver 11. MRSA bacteremia 12. Atrial fibrillation: Paroxysmal.  On amiodarone.    Plan/Discussion:     Hypotensive today, SBP in 70s.  WBCs climbing.  She is lethargic but awakens to voice.  Co-ox 49% on milrinone but has been low. Most recent echo 9/25 with normal LV EF.  Concern for septic shock.  - Add norepinephrine to keep MAP 65 and above.  - Send  procalcitonin. - IV abx were stopped yesterday, had been on empiric vanco/Zosyn.  Had MRSA in last blood cultures but ?contaminant per notes.  Repeat blood cultures today.  Will add back vancomycin + cefepime, will need to discuss this with CCM however.   - Continue milrinone for now.   CVP 13, has been getting CVVH.  Not pulling fluid today with low BP.   35 minutes critical care time.    Iliza Blankenbeckler,MD 9:12 AM  Advanced Heart Failure Team Pager [phone] (M-F; 7a - 4p)  Please contact [facility] for night-coverage after hours (4p -7a ) and weekends on [email or web address]

## 2016-09-02 NOTE — Progress Notes (Signed)
      [address]       [place] [address]             [phone]      Less responsive  BP 97/64   Pulse 98   Temp 97.2 F (36.2 C) (Axillary)   Resp (!) 35   Ht 5\' 1"  (1.549 m) Comment: from 08/15/16 encounter  Wt 148 lb 9.4 oz (67.4 kg)   SpO2 100%   BMI 28.08 kg/m   Milrinone @ 0.25 and levophed @ 8 mcg/min  Intake/Output Summary (Last 24 hours) at 09/02/16 1851 Last data filed at 09/02/16 1800  Gross per 24 hour  Intake           1639.6 ml  Output             2312 ml  Net           -672.4 ml   ABG- 7.54/19/47/17  Respiratory alkalosis and metabolic acidosis (renal failure) and hypoxia RN has been in communication with CCM Repeat ABG sent  Remo Lipps C. Roxan Hockey, MD [facility] [phone]

## 2016-09-02 NOTE — Progress Notes (Signed)
Pharmacy Antibiotic Note  Deborah [name] is a 60 y.o. female continues on vancomycin and zosyn for fever after completing therapy with ceftriaxone for group B strep/MSSA pneumonia. She is also on CRRT.  Antibiotics stopped 9/29, but to resume today (no interruptions in CRRT)    Plan: Vancomycin 750 mg IV Q 24 hours Zosyn 3.375 grams iv Q 6 hours   Height: 5\' 1"  (154.9 cm) (from 08/15/16 encounter) Weight: 148 lb 9.4 oz (67.4 kg) IBW/kg (Calculated) : 47.8  Temp (24hrs), Avg:96.9 F (36.1 C), Min:96 F (35.6 C), Max:97.7 F (36.5 C)   Recent Labs Lab 08/29/16 1900 08/30/16 0324  08/31/16 0336 08/31/16 0615  09/01/16 0500 09/01/16 1600 09/01/16 1613 09/02/16 0350 09/02/16 0825  WBC 29.2* 21.2*  --  20.5*  --   --  20.2*  --   --  22.9*  --   CREATININE  --  1.49*  < > 1.30*  1.34*  --   < > 1.47* 1.43* 1.20* 1.35* 1.30*  VANCOTROUGH  --   --   --   --  21*  --   --   --   --   --   --   < > = values in this interval not displayed.  Estimated Creatinine Clearance: 40.9 mL/min (by C-G formula based on SCr of 1.3 mg/dL (H)).    No Known Allergies  Antimicrobials this admission: 9/16 Vancomycin >> 9/18, 9/20>>9/22, resume 9/25>>9/29, resume 9/30> 9/16 Zosyn >> 9/18, resume 9/25>>9/29, resume 9/30>  9/25 Fluconazole x 3 doses CTX 9/18>> 9/24  Dose adjustments this admission: 9/17 Vanc random = 16, cont 1 g q48h 9/28 Vanc trough (on CRRT) = 21, cont 1g q24  Microbiology results: 9/29 BCx:  9/15 BCx: ngf 9/15 Sputum: mod group b strep/MSSA 9/15 MRSA PCR: negative 9/24 BCx2: 1/2 BCID CoNS likely contaminant  Thank you for allowing pharmacy to be a part of this patient's care.  Tad Moore 09/02/2016 10:53 AM

## 2016-09-02 NOTE — Progress Notes (Signed)
Subjective: Interval History: has no complaint, squeezes hand but not coop with all commands.  Objective: Vital signs in last 24 hours: Temp:  [96 F (35.6 C)-97.7 F (36.5 C)] 96 F (35.6 C) (09/30 0400) Pulse Rate:  [93-120] 93 (09/30 0600) Resp:  [17-31] 23 (09/30 0600) BP: (74-188)/(49-121) 80/57 (09/30 0600) SpO2:  [93 %-100 %] 96 % (09/30 0600) Arterial Line BP: (71-213)/(42-98) 82/50 (09/30 0600) FiO2 (%):  [40 %] 40 % (09/29 0840) Weight:  [67.4 kg (148 lb 9.4 oz)] 67.4 kg (148 lb 9.4 oz) (09/30 0300) Weight change: -4 kg (-8 lb 13.1 oz)  Intake/Output from previous day: 09/29 0701 - 09/30 0700 In: 1508.3 [I.V.:793.7; NG/GT:314.6; IV Piggyback:400] Out: 3228 [Emesis/NG output:50] Intake/Output this shift: No intake/output data recorded.  General appearance: cooperative, moderately obese, pale and slowed mentation Resp: rales bibasilar and rhonchi bibasilar Chest wall: LIJ cath Cardio: regular rate and rhythm, S1, S2 normal and systolic murmur: holosystolic 2/6, blowing at apex GI: pos bs, liver down 4 cm, soft Extremities: edema 2+  Lab Results:  Recent Labs  09/01/16 0500 09/01/16 1613 09/02/16 0350  WBC 20.2*  --  22.9*  HGB 8.1* 9.9* 8.5*  HCT 25.0* 29.0* 25.5*  PLT 189  --  248   BMET:  Recent Labs  09/01/16 1600 09/01/16 1613 09/02/16 0350  NA 137 140 138  K 3.4* 3.4* 3.6  CL 104 103 105  CO2 22  --  23  GLUCOSE 101* 98 125*  BUN 22* 23* 20  CREATININE 1.43* 1.20* 1.35*  CALCIUM 8.4*  --  8.2*   No results for input(s): PTH in the last 72 hours. Iron Studies: No results for input(s): IRON, TIBC, TRANSFERRIN, FERRITIN in the last 72 hours.  Studies/Results: Dg Chest Port 1 View  Result Date: 08/31/2016 CLINICAL DATA:  Status post dialysis catheter insertion. EXAM: PORTABLE CHEST 1 VIEW COMPARISON:  08/23/2016 FINDINGS: There has been interval placement of dual lumen large bore dialysis catheter from left internal jugular approach. The  catheter terminates in the expected location of the right atrium. The rest of the of the support apparatus and postsurgical changes are stable. The cardiac silhouette is mildly enlarged. Mediastinal contours appear intact. There is no evidence of pneumothorax. Mild pulmonary edema. Bilateral small pleural effusions cannot be excluded. Osseous structures are without acute abnormality. Soft tissues are grossly normal. IMPRESSION: Post insertion of large bore dual lumen central venous catheter with tip terminating in the expected location of the right atrium. Support apparatus otherwise stable. Mild pulmonary edema.  Possible bilateral small pleural effusions. Electronically Signed   By: Fidela Salisbury M.D.   On: 08/31/2016 17:31   Dg Abd Portable 1v  Result Date: 09/01/2016 CLINICAL DATA:  Feeding tube placement EXAM: PORTABLE ABDOMEN - 1 VIEW COMPARISON:  Three days ago FINDINGS: Feeding tube tip at the level the distal duodenum. A nasogastric tube is been removed. Central line tips overlaps the upper right atrium. Cardiomegaly. Mitral valve repair. Hazy basilar opacity accentuated by motion artifact. Visualized bowel gas pattern is nonobstructive. IMPRESSION: Feeding tube tip is at the distal duodenum. Electronically Signed   By: Monte Fantasia M.D.   On: 09/01/2016 15:04   Dg Fluoro Guide Cv Line-no Report  Result Date: 08/31/2016 CLINICAL DATA:  FLOURO GUIDE CV LINE Fluoroscopy was utilized by the requesting physician.  No radiographic interpretation.    I have reviewed the patient's current medications.  Assessment/Plan: 1  AKI vol xs yet but bp low, stop ned neg.  Phos pending . Acid/base/K ok.  Mechanics of CRRT ok. 2 Anemia lower today 3 MVR per CVS 4  CAD 5 DM controlled 6 Nutrition TF 7 R sided weakness 8 Resp xs secretioon P CRRT, even, check phos,  Suction,    LOS: 15 days   Verbie Babic L 09/02/2016,7:06 AM

## 2016-09-02 NOTE — Progress Notes (Signed)
RT/RN attempted to NTS pt, returning scant amount of clear/white secretions. RT also attempted flutter valve with pt, though pt seems to not clearly understand instructions. Pt has productive cough at times, but wont open mouth for [place] to suction, and appears to swallow anything she coughs up. RT will continue to monitor.

## 2016-09-02 NOTE — Evaluation (Signed)
Clinical/Bedside Swallow Evaluation Patient Details  Name: Deborah [name] MRN: [id number] Date of Birth: 08/13/1956  Today's Date: 09/02/2016 Time: SLP Start Time (ACUTE ONLY): 0846 SLP Stop Time (ACUTE ONLY): 0856 SLP Time Calculation (min) (ACUTE ONLY): 10 min  Past Medical History:  Past Medical History:  Diagnosis Date  . Diabetes mellitus   . Hyperlipidemia   . Hypertension    Past Surgical History:  Past Surgical History:  Procedure Laterality Date  . CARDIAC CATHETERIZATION N/A 08/15/2016   Procedure: Left Heart Cath and Coronary Angiography;  Surgeon: Yolonda Kida, MD;  Location: [place] [facility];  Service: Cardiovascular;  Laterality: N/A;  . CARDIAC CATHETERIZATION N/A 08/15/2016   Procedure: Coronary Stent Intervention;  Surgeon: Yolonda Kida, MD;  Location: [place] [facility];  Service: Cardiovascular;  Laterality: N/A;  . CARDIAC CATHETERIZATION N/A 08/18/2016   Procedure: Right Heart Cath;  Surgeon: Jolaine Artist, MD;  Location: [place] [facility];  Service: Cardiovascular;  Laterality: N/A;  . CARDIAC CATHETERIZATION N/A 08/18/2016   Procedure: IABP Insertion;  Surgeon: Jolaine Artist, MD;  Location: [place] [facility];  Service: Cardiovascular;  Laterality: N/A;  . CARDIAC CATHETERIZATION Right 08/23/2016   Procedure: CENTRAL LINE INSERTION RIGHT SUBCLAVIAN;  Surgeon: Ivin Poot, MD;  Location: [place];  Service: Open Heart Surgery;  Laterality: Right;  . ENDOVEIN HARVEST OF GREATER SAPHENOUS VEIN Left 08/23/2016   Procedure: ENDOVEIN HARVEST OF GREATER SAPHENOUS VEIN;  Surgeon: Ivin Poot, MD;  Location: [place];  Service: Open Heart Surgery;  Laterality: Left;  . INTRAOPERATIVE TRANSESOPHAGEAL ECHOCARDIOGRAM N/A 08/23/2016   Procedure: INTRAOPERATIVE TRANSESOPHAGEAL ECHOCARDIOGRAM;  Surgeon: Ivin Poot, MD;  Location: [place];  Service: Open Heart Surgery;  Laterality: N/A;  . MITRAL VALVE REPAIR N/A 08/23/2016   Procedure: MITRAL VALVE  REPAIR (MVR) USING 25MM EDWARDS MAGNA EASE BIOPROSTHESIS MITRAL  VALVE;  Surgeon: Ivin Poot, MD;  Location: [place];  Service: Open Heart Surgery;  Laterality: N/A;  . VAGINAL DELIVERY     x 6   HPI:  60 yo female admitted to [facility] 08/15/16 with STEMI s/p cath to [place]. Developed cardiogenic shock, VDRF (ETT 9/14-9/29), AKI with metabolic acidosis and started on CRRT 08/17/16. Transferred to [facility] to tx shock and evaluated new severe MR from papillary muscle rupture. She also had fever and found to have Group B Strep in sputum culture. Mitral valve replacement 9/20.   Assessment / Plan / Recommendation Clinical Impression  Pt is alert but does not verbalize or follow commands. She has poor oral acceptance and awareness, losing several ice chips anteriorly before she can even start oral manipulation. She has oral holding requiring suction of one bolus for removal. Pt did swallow one ice chip with immediate coughing, productive of moderate amount of secretions that were likely pooling in her oral cavity and/or pharynx. Recommend to remain NPO. Will continue to follow for readiness to start diet with increased time post-extubation pending improvements in mentation.    Aspiration Risk  Severe aspiration risk    Diet Recommendation NPO   Medication Administration: Via alternative means    Other  Recommendations Oral Care Recommendations: Oral care QID Other Recommendations: Have oral suction available   Follow up Recommendations  (tba)      Frequency and Duration min 2x/week  2 weeks       Prognosis Prognosis for Safe Diet Advancement: Good Barriers to Reach Goals: Cognitive deficits      Swallow Study  General HPI: 60 yo female admitted to [facility] 08/15/16 with STEMI s/p cath to [place]. Developed cardiogenic shock, VDRF (ETT 9/14-9/29), AKI with metabolic acidosis and started on CRRT 08/17/16. Transferred to [facility] to tx shock and evaluated new severe MR from papillary muscle rupture. She  also had fever and found to have Group B Strep in sputum culture. Mitral valve replacement 9/20. Type of Study: Bedside Swallow Evaluation Previous Swallow Assessment: none in chart Diet Prior to this Study: NPO Temperature Spikes Noted: No Respiratory Status: Nasal cannula History of Recent Intubation: Yes Length of Intubations (days): 15 days Date extubated: 09/01/16 Behavior/Cognition: Alert;Doesn't follow directions Oral Cavity Assessment: Other (comment) (difficult to assess, does not open mouth ) Oral Care Completed by SLP: No (pt does not open mouth to command) Oral Cavity - Dentition:  (difficult to assess, missing one front tooth) Self-Feeding Abilities: Total assist Patient Positioning: Upright in bed Baseline Vocal Quality: Not observed Volitional Cough: Cognitively unable to elicit Volitional Swallow: Unable to elicit    Oral/Motor/Sensory Function Overall Oral Motor/Sensory Function: Other (comment) (UTA)   Ice Chips Ice chips: Impaired Presentation: Spoon Oral Phase Impairments: Poor awareness of bolus;Reduced labial seal Oral Phase Functional Implications: Oral holding;Other (comment) (anterior loss) Pharyngeal Phase Impairments: Cough - Immediate;Suspected delayed Swallow   Thin Liquid Thin Liquid: Not tested    Nectar Thick Nectar Thick Liquid: Not tested   Honey Thick Honey Thick Liquid: Not tested   Puree Puree: Not tested   Solid   GO   Solid: Not tested        Germain Osgood 09/02/2016,9:09 AM  Germain Osgood, M.A. CCC-SLP [phone]

## 2016-09-02 NOTE — Progress Notes (Signed)
PULMONARY / CRITICAL CARE MEDICINE   Name: Deborah [name] MRN: [id number] DOB: 23-Dec-1955    ADMISSION DATE:  08/18/2016 CONSULTATION DATE:  08/18/2016  REFERRING MD:  Bensimhon   CHIEF COMPLAINT:  Ventilator management and critical care services   HISTORY OF PRESENT ILLNESS:   60 yo female admitted to [facility] 08/15/16 with STEMI s/p cath to [place].  Developed cardiogenic shock, VDRF (ETT 9/14), AKI with metabolic acidosis and started on CRRT 08/17/16.  Transferred to [facility] to tx shock and evaluated new severe MR from papillary muscle rupture.  She also had fever and found to have Group B Strep in sputum culture. Mitral valve replacement 9/20.  SUBJECTIVE:  Agitated yesterday, pulled nG x 2 More sedate this am Afebrile on CRRT, remains critically ill   VITAL SIGNS: BP (!) 70/46   Pulse 89   Temp (!) 96.3 F (35.7 C) (Axillary)   Resp (!) 31   Ht 5\' 1"  (1.549 m) Comment: from 08/15/16 encounter  Wt 148 lb 9.4 oz (67.4 kg)   SpO2 98%   BMI 28.08 kg/m   HEMODYNAMICS: CVP:  [11 mmHg-21 mmHg] 21 mmHg  VENTILATOR SETTINGS:    INTAKE / OUTPUT: I/O last 3 completed shifts: In: 2866.1 [I.V.:1461.5; NG/GT:854.6; IV Piggyback:550] Out: 8341 [Emesis/NG output:150; Other:4792]  PHYSICAL EXAMINATION: General: acutely ill, sedate HENT: NCAT  PULM: few rhonchi bilaterally, decreased BS CV: RRR, systolic murmur noted GI: BS+, soft, nontender MSK: normal bulk and tone Neuro: does not Follow commands  LABS:  BMET  Recent Labs Lab 09/01/16 1600 09/01/16 1613 09/02/16 0350 09/02/16 0825  NA 137 140 138 136  K 3.4* 3.4* 3.6 3.8  CL 104 103 105 105  CO2 22  --  23 19*  BUN 22* 23* 20 19  CREATININE 1.43* 1.20* 1.35* 1.30*  GLUCOSE 101* 98 125* 114*    Electrolytes  Recent Labs Lab 08/31/16 0336  09/01/16 0500 09/01/16 1600 09/02/16 0350 09/02/16 0825 09/02/16 0846  CALCIUM 8.2*  8.2*  < > 7.7* 8.4* 8.2* 7.8*  --   MG 2.5*  --  2.2  --  2.4  --   --   PHOS 2.1*  < >  --   2.1*  --  2.4* 2.2*  < > = values in this interval not displayed.  CBC  Recent Labs Lab 08/31/16 0336  09/01/16 0500 09/01/16 1613 09/02/16 0350  WBC 20.5*  --  20.2*  --  22.9*  HGB 8.6*  < > 8.1* 9.9* 8.5*  HCT 26.5*  < > 25.0* 29.0* 25.5*  PLT 157  --  189  --  248  < > = values in this interval not displayed.  Coag's  Recent Labs Lab 08/27/16 0321 08/28/16 0429 08/29/16 0431  APTT 123* 156* 194*    Sepsis Markers  Recent Labs Lab 08/28/16 0909 08/29/16 0431 08/30/16 0324  PROCALCITON 6.74 4.85 4.90    ABG  Recent Labs Lab 08/31/16 0344 09/01/16 0517 09/02/16 0359  PHART 7.453* 7.465* 7.530*  PCO2ART 36.4 33.1 29.2*  PO2ART 100.0 133* 73.0*    Liver Enzymes  Recent Labs Lab 08/31/16 0336  09/01/16 0500 09/01/16 1600 09/02/16 0350 09/02/16 0825  AST 37  --  37  --  42*  --   ALT 123*  --  91*  --  86*  --   ALKPHOS 142*  --  143*  --  121  --   BILITOT 1.1  --  1.2  --  1.6*  --  ALBUMIN 3.1*  3.1*  < > 2.6* 2.8* 2.6* 2.5*  < > = values in this interval not displayed.  Cardiac Enzymes No results for input(s): TROPONINI, PROBNP in the last 168 hours.  Glucose  Recent Labs Lab 09/01/16 1145 09/01/16 1655 09/01/16 1954 09/01/16 2321 09/02/16 0331 09/02/16 0835  GLUCAP 218* 71 102* 103* 127* 100*    Imaging Dg Chest Port 1 View  Result Date: 09/02/2016 CLINICAL DATA:  Secretions from being intubated. Difficulty breathing. EXAM: PORTABLE CHEST 1 VIEW COMPARISON:  August 31, 2016 FINDINGS: Increasing layering effusion and underlying atelectasis on the right greater than the left. Cardiomegaly. Stable double lumen subclavian catheter. No pneumothorax. No pulmonary nodules or masses. Probable mild edema. No other interval changes. IMPRESSION: Mild edema with small bilateral effusions and underlying atelectasis, worsened in the interval. Electronically Signed   By: Dorise Bullion III M.D   On: 09/02/2016 08:11   Dg Abd Portable  1v  Result Date: 09/01/2016 CLINICAL DATA:  Feeding tube placement EXAM: PORTABLE ABDOMEN - 1 VIEW COMPARISON:  Three days ago FINDINGS: Feeding tube tip at the level the distal duodenum. A nasogastric tube is been removed. Central line tips overlaps the upper right atrium. Cardiomegaly. Mitral valve repair. Hazy basilar opacity accentuated by motion artifact. Visualized bowel gas pattern is nonobstructive. IMPRESSION: Feeding tube tip is at the distal duodenum. Electronically Signed   By: Monte Fantasia M.D.   On: 09/01/2016 15:04    STUDIES:  LHC 9/12: PCI to OM1, severe MR, EF 55 to 60% RHC 9/15: RA 5, RV 37/3/6, PA 38/18/26, PCWP 15, CI 1.9, PVR 3.3 WU TEE 9/15: EF 60 to 65%, flail motion MV with severe MR RUQ ultrasound 9/23 > Gallbladder wall thickening noted, suggestion of sludge, small R effusion, liver unremarkable but left lobe not well visulalized  MICROBIOLOGY: MRSA PCR 9/12:  Negative MRSA PCR 9/15:  Negative Tracheal Asp Ctx 9/15:  Strep agalactiae  Blood Ctx x2 9/15:  Negative  MRSA PCR 9/19:  Negative  Blood culture 9/24: coag neg staph 2/4  ANTIBIOTICS: Zosyn 9/16 - 9/17 Vancomycin 9/16 - 9/17 Rocephin 9/17 >> 9/24 Vancomycin 9/20 (surgical prophylaxis) Vanc 9/25 > Zosyn 9/25 >  Fluconazole 9/25 >   SIGNIFICANT EVENTS: 9/12 Admit to [facility], PCI to [place] 9/14 ETT, CRRT 9/15 to [facility] 9/16 off insulin gtt 9/20 To OR for Mitral Valve Repair w/ bioprosthetic valve 9/28 To OR for permcath 9/29 Extubated  LINES/TUBES: Right IJ PICC 9/12 - 9/20 Rt femoral introducer 9/15 - 9/20 OETT 8.0 9/14 >>  HD cath left IJ 9/14 >> R IJ Introducer/CVL/Swan 9/20 >>  9/25 IABP 9/15 >>9/22 Lt femoral a line 9/15 >> 9/25 Y Chest Tube/Mediastinal Chest Tube 9/20 >> PIV x1 L brachial art line 9/25 > 9/28 R IJ CVL > 9/28 L IJ Permcath >   ASSESSMENT / PLAN:  PULMONARY A: Acute Hypoxic Respiratory Failure - Secondary to acute pulmonary edema > resolved Has been on minimal vent  settings for days as of 9/29 Aspiration risk P:   Extubated but marginal   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 Shock - Combination cardiogenic & septic  P:  Telemetry  Post-op Mgt per CVTS/Cardiology Restart levo gtt ASA, Plavix per cardiology/CVTS  Amio, Milrinone gtt's per cardiology/CVTS  RENAL A:   Acute Renal Failure - Baseline creatinine 1.6-1.7  P:   Nephrology Following Continue volume removal per CVVHD Monitor BMET and UOP Replace electrolytes as needed  GASTROINTESTINAL A:   Transaminitis - peaked 9/23, near complete resolution, unclear etiology Mild Malnutrition - Related to critical illness New onset a fib P:   Pepcid IV q12hr Replace NGT - TF held Defer SLP evaluation  HEMATOLOGIC A:   Anemia - Last transfusion 9/20 Leukocytosis - Multifactorial  P:  Trending cell counts w/ CBC SCDs  INFECTIOUS A:   Group B Strep Pneumonia Coag negative staph bacteremia (likely contaminant) Line changes on 9/28 P:   Vanc and zosyn restarted on 9/25 and Fluconazole started on 9/25 > stopped 9/29 but resume today given increasing WBC, chk pct algorithm Re-culture for fever  ENDOCRINE A:   DM - Glucose controlled  P:   Subcutaneous insulin per CVTS Levemir BID  NEUROLOGIC A:   New onset R sided hemiplegia and Preferental gaze to Right (9/25). Cranial CT scan was (-) for acute changes.  > resolved without evidence of weakness on 9/29 Acute Encephalopathy - Multifactorial and likely combination toxic metabolic & hypoxia - was awake,agitated 9/29, sedate 9/30  P:   Hold off on MRI for now Follow neuro exam Dc ativan Fent prn pain  My cc time x 14m  Kara Mead MD. Shade Flood. [place] [facility] Pager [phone] If no response call [phone]     09/02/2016, 10:42 AM

## 2016-09-02 NOTE — Procedures (Signed)
Intubation Procedure Note Deborah [name] [id number] 1956/01/14  Procedure: Intubation Indications: Respiratory insufficiency  Procedure Details Consent: Risks of procedure as well as the alternatives and risks of each were explained to the (patient/caregiver).  Consent for procedure obtained. Time Out: Verified patient identification, verified procedure, site/side was marked, verified correct patient position, special equipment/implants available, medications/allergies/relevent history reviewed, required imaging and test results available.  Performed  Maximum sterile technique was used including gloves, gown, hand hygiene and mask.  Glidescope 3  The patient was premedicated with 1mg  versed, 42mcg fentanyl and pre-oxygenated with 100% FiO2.  20mg  etomidate given with inadequate relaxation. Additional 20mg  etomidate given.  Narrow oral aperture, but Glidescope 3 inserted atraumatically with Grade IIa view of glottis. 7.5 ETT visualized passing between the cords to a depth of 22cm at the teeth. + color change, + tube condensation, +symmetrical chest rise, + bilateral breath sounds. No complications.  Evaluation Hemodynamic Status: BP stable throughout; O2 sats: stable throughout Patient's Current Condition: stable Complications: No apparent complications Patient did tolerate procedure well. Chest X-ray ordered to verify placement.  CXR: pending.  Yisroel Ramming, MD Pulmonary and Critical Care 09/02/16 8:29 PM

## 2016-09-03 ENCOUNTER — Inpatient Hospital Stay (HOSPITAL_COMMUNITY): Payer: Medicaid Other

## 2016-09-03 LAB — COMPREHENSIVE METABOLIC PANEL
ALK PHOS: 119 U/L (ref 38–126)
ALT: 82 U/L — ABNORMAL HIGH (ref 14–54)
ANION GAP: 8 (ref 5–15)
AST: 42 U/L — ABNORMAL HIGH (ref 15–41)
Albumin: 2.6 g/dL — ABNORMAL LOW (ref 3.5–5.0)
BUN: 21 mg/dL — ABNORMAL HIGH (ref 6–20)
CHLORIDE: 104 mmol/L (ref 101–111)
CO2: 25 mmol/L (ref 22–32)
CREATININE: 1.21 mg/dL — AB (ref 0.44–1.00)
Calcium: 8.1 mg/dL — ABNORMAL LOW (ref 8.9–10.3)
GFR calc non Af Amer: 48 mL/min — ABNORMAL LOW (ref >60)
GFR, EST AFRICAN AMERICAN: 56 mL/min — AB (ref >60)
Glucose, Bld: 95 mg/dL (ref 65–99)
Potassium: 3.5 mmol/L (ref 3.5–5.1)
SODIUM: 137 mmol/L (ref 135–145)
TOTAL PROTEIN: 6.7 g/dL (ref 6.5–8.1)
Total Bilirubin: 1.4 mg/dL — ABNORMAL HIGH (ref 0.3–1.2)

## 2016-09-03 LAB — RENAL FUNCTION PANEL
ALBUMIN: 2.6 g/dL — AB (ref 3.5–5.0)
Anion gap: 10 (ref 5–15)
BUN: 22 mg/dL — AB (ref 6–20)
CHLORIDE: 102 mmol/L (ref 101–111)
CO2: 23 mmol/L (ref 22–32)
CREATININE: 1.22 mg/dL — AB (ref 0.44–1.00)
Calcium: 8.1 mg/dL — ABNORMAL LOW (ref 8.9–10.3)
GFR calc Af Amer: 55 mL/min — ABNORMAL LOW (ref >60)
GFR calc non Af Amer: 48 mL/min — ABNORMAL LOW (ref >60)
GLUCOSE: 133 mg/dL — AB (ref 65–99)
PHOSPHORUS: 4.1 mg/dL (ref 2.5–4.6)
POTASSIUM: 3.8 mmol/L (ref 3.5–5.1)
Sodium: 135 mmol/L (ref 135–145)

## 2016-09-03 LAB — POCT I-STAT 4, (NA,K, GLUC, HGB,HCT)
GLUCOSE: 134 mg/dL — AB (ref 65–99)
HCT: 29 % — ABNORMAL LOW (ref 36.0–46.0)
HEMOGLOBIN: 9.9 g/dL — AB (ref 12.0–15.0)
POTASSIUM: 3.8 mmol/L (ref 3.5–5.1)
Sodium: 137 mmol/L (ref 135–145)

## 2016-09-03 LAB — CARBOXYHEMOGLOBIN
CARBOXYHEMOGLOBIN: 0.7 % (ref 0.5–1.5)
METHEMOGLOBIN: 1.3 % (ref 0.0–1.5)
O2 Saturation: 58.4 %
Total hemoglobin: 9.3 g/dL — ABNORMAL LOW (ref 12.0–16.0)

## 2016-09-03 LAB — POCT I-STAT 3, ART BLOOD GAS (G3+)
ACID-BASE EXCESS: 1 mmol/L (ref 0.0–2.0)
BICARBONATE: 23.8 mmol/L (ref 20.0–28.0)
O2 SAT: 99 %
TCO2: 25 mmol/L (ref 0–100)
pCO2 arterial: 31 mmHg — ABNORMAL LOW (ref 32.0–48.0)
pH, Arterial: 7.491 — ABNORMAL HIGH (ref 7.350–7.450)
pO2, Arterial: 106 mmHg (ref 83.0–108.0)

## 2016-09-03 LAB — GLUCOSE, CAPILLARY
GLUCOSE-CAPILLARY: 199 mg/dL — AB (ref 65–99)
GLUCOSE-CAPILLARY: 107 mg/dL — AB (ref 65–99)
Glucose-Capillary: 125 mg/dL — ABNORMAL HIGH (ref 65–99)

## 2016-09-03 LAB — POCT ACTIVATED CLOTTING TIME
Activated Clotting Time: 224 seconds
Activated Clotting Time: 213 seconds
Activated Clotting Time: 207 seconds
Activated Clotting Time: 208 seconds
Activated Clotting Time: 208 seconds

## 2016-09-03 LAB — CBC
HCT: 27.1 % — ABNORMAL LOW (ref 36.0–46.0)
Hemoglobin: 9 g/dL — ABNORMAL LOW (ref 12.0–15.0)
MCH: 29.8 pg (ref 26.0–34.0)
MCHC: 33.2 g/dL (ref 30.0–36.0)
MCV: 89.7 fL (ref 78.0–100.0)
PLATELETS: 359 10*3/uL (ref 150–400)
RBC: 3.02 MIL/uL — ABNORMAL LOW (ref 3.87–5.11)
RDW: 17.4 % — ABNORMAL HIGH (ref 11.5–15.5)
WBC: 33 10*3/uL — ABNORMAL HIGH (ref 4.0–10.5)

## 2016-09-03 LAB — PHOSPHORUS: Phosphorus: 2.1 mg/dL — ABNORMAL LOW (ref 2.5–4.6)

## 2016-09-03 LAB — CORTISOL-AM, BLOOD: CORTISOL - AM: 25.6 ug/dL — AB (ref 6.7–22.6)

## 2016-09-03 LAB — PROCALCITONIN: Procalcitonin: 16.37 ng/mL

## 2016-09-03 LAB — MAGNESIUM: Magnesium: 2.4 mg/dL (ref 1.7–2.4)

## 2016-09-03 MED ORDER — SODIUM PHOSPHATES 45 MMOLE/15ML IV SOLN
30.0000 mmol | Freq: Once | INTRAVENOUS | Status: AC
  Administered 2016-09-03: 30 mmol via INTRAVENOUS
  Filled 2016-09-03: qty 10

## 2016-09-03 MED ORDER — CHLORHEXIDINE GLUCONATE 0.12% ORAL RINSE (MEDLINE KIT)
15.0000 mL | Freq: Two times a day (BID) | OROMUCOSAL | Status: DC
  Administered 2016-09-03 – 2016-09-18 (×28): 15 mL via OROMUCOSAL

## 2016-09-03 MED ORDER — DEXTROSE 5 % IV SOLN
2.0000 ug/min | INTRAVENOUS | Status: DC
  Administered 2016-09-03: 16 ug/min via INTRAVENOUS
  Administered 2016-09-03: 19 ug/min via INTRAVENOUS
  Administered 2016-09-04: 11 ug/min via INTRAVENOUS
  Administered 2016-09-06: 7 ug/min via INTRAVENOUS
  Administered 2016-09-08: 4 ug/min via INTRAVENOUS
  Filled 2016-09-03 (×6): qty 16

## 2016-09-03 MED ORDER — ORAL CARE MOUTH RINSE
15.0000 mL | OROMUCOSAL | Status: DC
  Administered 2016-09-03 – 2016-09-18 (×131): 15 mL via OROMUCOSAL

## 2016-09-03 MED ORDER — VASOPRESSIN 20 UNIT/ML IV SOLN
0.0050 [IU]/min | INTRAVENOUS | Status: DC
  Administered 2016-09-03 – 2016-09-05 (×3): 0.03 [IU]/min via INTRAVENOUS
  Administered 2016-09-06: 0.01 [IU]/min via INTRAVENOUS
  Filled 2016-09-03 (×6): qty 2

## 2016-09-03 NOTE — Progress Notes (Addendum)
Patient ID: Deborah [name], female   DOB: 06/28/1956, 60 y.o.   MRN: [id number]   60 y/o woman with DM2, HTN, HL and noncompliance presented to [facility] ER with infrerior posterior STEMI on 9/12. Taken to cath lab and found to have occluded small to moderate OM-1 branch. Underwent successful PCS with stent x 2 by Dr. Clayborn Bigness. Coronaries otherwise ok. EF 55-60% with severe MR confirmed by echo.   Post-cath developed respiratory failure and required intubation. Developed shock and renal failure with peak creatinine 3.4 (1.6 on admit). Trialysis catheter placed.   Patient persistently acidotic with concern for ischemic MR and transferred here. En route develop severe hypotension and norepinephrine started.   Brought to cath lab 9/15 and TEE showed normal LV (EF 60-65%) and normal RV function with ruptured posterior papillary muscle with severe posterior MR.  Underwent placement of Swan and IABP.   Treated for group B Strep PNA.   S/P MVR 08/23/16.  IABP pulled 9/22.   Echo 9/23  RV and LV are normal (EF 60%) but appeared underfilled   Extubated 9/29.  However, picture consistent with septic shock on 9/30 so pressors started and she was re-intubated.   Remains on milrinone 0.25. Co-ox 58%.  She is now on norepinephrine at 21.  Afebrile but WBCs climbing to 33K.  CVP 13, ongoing CVVH but not pulling fluid. Procalcitonin 16.    Scheduled Meds: . atorvastatin  20 mg Oral q1800  . chlorhexidine  15 mL Mouth Rinse BID  . clopidogrel  75 mg Oral Daily  . famotidine (PEPCID) IV  20 mg Intravenous Q12H  . feeding supplement (NEPRO CARB STEADY)  1,000 mL Per Tube Q24H  . feeding supplement (PRO-STAT SUGAR FREE 64)  30 mL Per Tube TID  . insulin aspart  0-24 Units Subcutaneous Q4H  . insulin detemir  10 Units Subcutaneous BID  . mouth rinse  15 mL Mouth Rinse q12n4p  . piperacillin-tazobactam  3.375 g Intravenous Q6H  . sodium chloride flush  3 mL Intravenous Q12H  . sodium phosphate  Dextrose 5%  IVPB  30 mmol Intravenous Once  . vancomycin  750 mg Intravenous Q24H   Continuous Infusions: . sodium chloride Stopped (08/27/16 0900)  . sodium chloride    . sodium chloride 10 mL/hr at 09/03/16 0700  . amiodarone 30 mg/hr (09/03/16 0700)  . heparin 10,000 units/ 20 mL infusion syringe 1,350 Units/hr (09/03/16 0114)  . lidocaine Stopped (08/29/16 0900)  . milrinone 0.252 mcg/kg/min (09/03/16 0700)  . norepinephrine (LEVOPHED) Adult infusion 21.973 mcg/min (09/03/16 0700)  . dialysis replacement fluid (prismasate) 600 mL/hr at 09/03/16 0222  . dialysis replacement fluid (prismasate) 200 mL/hr at 09/02/16 1515  . dialysate (PRISMASATE) 1,000 mL/hr at 09/02/16 2233  . vasopressin (PITRESSIN) infusion - *FOR SHOCK*     PRN Meds:.sodium chloride, Place/Maintain arterial line **AND** sodium chloride, fentaNYL (SUBLIMAZE) injection, fentaNYL (SUBLIMAZE) injection, heparin, HYDROcodone-acetaminophen, midazolam, midazolam, ondansetron (ZOFRAN) IV, sodium chloride, sodium chloride flush    Vitals:   09/03/16 0630 09/03/16 0700 09/03/16 0730 09/03/16 0820  BP: 109/64 95/68 (!) 111/96 (!) 92/57  Pulse: (!) 111 (!) 110 (!) 119 (!) 111  Resp: 18 (!) 21 20 (!) 25  Temp:      TempSrc:      SpO2: 100% 100% 100% 100%  Weight:      Height:        Intake/Output Summary (Last 24 hours) at 09/03/16 0824 Last data filed at 09/03/16 0700  Gross per 24  hour  Intake          2299.63 ml  Output             2577 ml  Net          -277.37 ml    LABS: Basic Metabolic Panel:  Recent Labs  09/02/16 0350  09/02/16 0846  09/02/16 1750 09/03/16 0330  NA 138  < >  --   < > 135 137  K 3.6  < >  --   < > 3.8 3.5  CL 105  < >  --   --  101 104  CO2 23  < >  --   --  17* 25  GLUCOSE 125*  < >  --   < > 146* 95  BUN 20  < >  --   --  18 21*  CREATININE 1.35*  < >  --   --  1.27* 1.21*  CALCIUM 8.2*  < >  --   --  8.1* 8.1*  MG 2.4  --   --   --   --  2.4  PHOS  --   < > 2.2*  --   --  2.1*  < > =  values in this interval not displayed. Liver Function Tests:  Recent Labs  09/02/16 1750 09/03/16 0330  AST 45* 42*  ALT 87* 82*  ALKPHOS 109 119  BILITOT 1.7* 1.4*  PROT 6.4* 6.7  ALBUMIN 2.7* 2.6*   No results for input(s): LIPASE, AMYLASE in the last 72 hours. CBC:  Recent Labs  09/02/16 0350 09/02/16 1705 09/03/16 0330  WBC 22.9*  --  33.0*  HGB 8.5* 8.8* 9.0*  HCT 25.5* 26.0* 27.1*  MCV 89.2  --  89.7  PLT 248  --  359   Cardiac Enzymes: No results for input(s): CKTOTAL, CKMB, CKMBINDEX, TROPONINI in the last 72 hours. BNP: Invalid input(s): POCBNP D-Dimer: No results for input(s): DDIMER in the last 72 hours. Hemoglobin A1C: No results for input(s): HGBA1C in the last 72 hours. Fasting Lipid Panel: No results for input(s): CHOL, HDL, LDLCALC, TRIG, CHOLHDL, LDLDIRECT in the last 72 hours. Thyroid Function Tests: No results for input(s): TSH, T4TOTAL, T3FREE, THYROIDAB in the last 72 hours.  Invalid input(s): FREET3 Anemia Panel: No results for input(s): VITAMINB12, FOLATE, FERRITIN, TIBC, IRON, RETICCTPCT in the last 72 hours.  PHYSICAL EXAM General: Intubated/sedated Neck: RIJ TLC LIJ trialysis  Lungs: Clear anteriorly CV: Nondisplaced PMI.  Heart tachy, regular S1/S2, no S3/S4, 1/6 SEM USB.   Abdomen: Soft, ND, no HSM. No bruits or masses. +BS  Neurologic: Sedated.  Not responding to verbal stimuli.  Extremities: No clubbing or cyanosis. 1+ ankle edema. GU: Foley    TELEMETRY: NSR  Assessment:   1. Cardiogenic shock: Due to acute severe MR. EF 70% on 9/25 echo.  2. CAD: Inferior posterior STEMI on 9/13 with DES x 2 to OM-1      --Plavix restarted on 9/22 3. 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     --s/p MVR 4. AKI on CKD stage III - due to ATN likely from cardiogenic shock + contrast with cath. Has required CVVH.  5. Acute respiratory failure: Pulmonary edema.  6.  DM2 7. Suspect component of septic shock: Possible PNA, group B Strep in sputum culture.  8. Anemia 9. PVCs   10. Shock liver 11. MRSA bacteremia 12. Atrial fibrillation: Paroxysmal.  On amiodarone.  Plan/Discussion:    Septic shock, PCT 16 and WBCs up to 33.  Vancomycin/Zosyn started back yesterday and she was re-intubated.  Norepinephrine started, she continues milrinone.  Co-ox higher at 58% this morning.   - Adding vasopressin with marginal BP this am.   - Pending yesterday's blood cultures.   CVP 13, has been getting CVVH.  Not pulling fluid today with low BP.   Rhythm may be atrial flutter, HR in 110s, will get ECG.  She is on amiodarone gtt.   Remedios Mckone,MD 09/03/2016   Advanced Heart Failure Team Pager [phone] (M-F; [place])  Please contact [facility] for night-coverage after hours (4p -7a ) and weekends on [email or web address]

## 2016-09-03 NOTE — Progress Notes (Signed)
      [address]       [place] [address]             [phone]      Intubated and sedated  BP 105/74   Pulse (!) 120   Temp 99.1 F (37.3 C) (Axillary)   Resp (!) 27   Ht 5\' 1"  (1.549 m)   Wt 146 lb 13.2 oz (66.6 kg)   SpO2 100%   BMI 27.74 kg/m    Intake/Output Summary (Last 24 hours) at 09/03/16 2011 Last data filed at 09/03/16 2000  Gross per 24 hour  Intake          3666.17 ml  Output             3808 ml  Net          -141.83 ml   Remains on milrinone, vasopressin, levophed  Deborah Lipps C. Roxan Hockey, MD [facility] [phone]

## 2016-09-03 NOTE — Progress Notes (Signed)
PULMONARY / CRITICAL CARE MEDICINE   Name: Deborah [name] MRN: [id number] DOB: 08-09-1956    ADMISSION DATE:  08/18/2016 CONSULTATION DATE:  08/18/2016  REFERRING MD:  Bensimhon   CHIEF COMPLAINT:  Ventilator management and critical care services   HISTORY OF PRESENT ILLNESS:   60 yo female admitted to [facility] 08/15/16 with STEMI s/p cath to [place].  Developed cardiogenic shock, VDRF (ETT 9/14), AKI with metabolic acidosis and started on CRRT 08/17/16.  Transferred to [facility] to tx shock and evaluated new severe MR from papillary muscle rupture.  She also had fever and found to have Group B Strep in sputum culture. Mitral valve replacement 9/20.  SUBJECTIVE:  Reintubated yesterday Afebrile on CRRT, remains critically ill On higher levophed gtt   VITAL SIGNS: BP (!) 111/96   Pulse (!) 119   Temp 97.8 F (36.6 C) (Axillary)   Resp 20   Ht 5\' 1"  (1.549 m)   Wt 146 lb 13.2 oz (66.6 kg)   SpO2 100%   BMI 27.74 kg/m   HEMODYNAMICS: CVP:  [13 mmHg-24 mmHg] 13 mmHg  VENTILATOR SETTINGS: Vent Mode: PRVC FiO2 (%):  [40 %-55 %] 40 % Set Rate:  [18 bmp] 18 bmp Vt Set:  [420 mL] 420 mL PEEP:  [8 cmH20] 8 cmH20 Plateau Pressure:  [17 cmH20-24 cmH20] 17 cmH20  INTAKE / OUTPUT: I/O last 3 completed shifts: In: 2823.4 [I.V.:1935.4; NG/GT:180; IV GDJMEQAST:419] Out: 3690 [Other:3690]  PHYSICAL EXAMINATION: General: acutely ill, sedate, moves spont HENT: NCAT  PULM: few rhonchi bilaterally, decreased BS CV: RRR, systolic murmur noted GI: BS+, soft, nontender MSK: normal bulk and tone Neuro: does not Follow commands  LABS:  BMET  Recent Labs Lab 09/02/16 0825 09/02/16 1705 09/02/16 1750 09/03/16 0330  NA 136 138 135 137  K 3.8 3.7 3.8 3.5  CL 105  --  101 104  CO2 19*  --  17* 25  BUN 19  --  18 21*  CREATININE 1.30*  --  1.27* 1.21*  GLUCOSE 114* 130* 146* 95    Electrolytes  Recent Labs Lab 09/01/16 0500  09/02/16 0350 09/02/16 0825 09/02/16 0846 09/02/16 1750  09/03/16 0330  CALCIUM 7.7*  < > 8.2* 7.8*  --  8.1* 8.1*  MG 2.2  --  2.4  --   --   --  2.4  PHOS  --   < >  --  2.4* 2.2*  --  2.1*  < > = values in this interval not displayed.  CBC  Recent Labs Lab 09/01/16 0500  09/02/16 0350 09/02/16 1705 09/03/16 0330  WBC 20.2*  --  22.9*  --  33.0*  HGB 8.1*  < > 8.5* 8.8* 9.0*  HCT 25.0*  < > 25.5* 26.0* 27.1*  PLT 189  --  248  --  359  < > = values in this interval not displayed.  Coag's  Recent Labs Lab 08/28/16 0429 08/29/16 0431  APTT 156* 194*    Sepsis Markers  Recent Labs Lab 08/30/16 0324 09/02/16 1107 09/03/16 0330  PROCALCITON 4.90 4.63 16.37    ABG  Recent Labs Lab 09/02/16 1850 09/02/16 2240 09/03/16 0420  PHART 7.524* 7.462* 7.491*  PCO2ART 22.4* 32.1 31.0*  PO2ART 64.9* 96.0 106.0    Liver Enzymes  Recent Labs Lab 09/02/16 0350 09/02/16 0825 09/02/16 1750 09/03/16 0330  AST 42*  --  45* 42*  ALT 86*  --  87* 82*  ALKPHOS 121  --  109 119  BILITOT 1.6*  --  1.7* 1.4*  ALBUMIN 2.6* 2.5* 2.7* 2.6*    Cardiac Enzymes No results for input(s): TROPONINI, PROBNP in the last 168 hours.  Glucose  Recent Labs Lab 09/01/16 2321 09/02/16 0331 09/02/16 0835 09/02/16 1223 09/02/16 1944 09/02/16 2332  GLUCAP 103* 127* 100* 142* 174* 225*    Imaging Dg Chest Port 1 View  Result Date: 09/02/2016 CLINICAL DATA:  Repositioning of endotracheal tube. EXAM: PORTABLE CHEST 1 VIEW COMPARISON:  Radiograph of September 02, 2016. FINDINGS: Stable cardiomediastinal silhouette. Status post cardiac valve repair. Endotracheal tube is improved in position, with distal tip 1.5 cm above the carina. Feeding tube is seen entering stomach. Left internal jugular dialysis catheter is unchanged in position. Right internal jugular catheter is also unchanged with distal tip in expected position of the SVC. No pneumothorax is noted. Mild bibasilar edema or subsegmental atelectasis is noted, with probable minimal  associated pleural effusions. Bony thorax is unremarkable. IMPRESSION: Endotracheal tube is improved in position. Otherwise stable support apparatus. Stable bibasilar opacities as described above. Electronically Signed   By: Marijo Conception, M.D.   On: 09/02/2016 21:24   Dg Chest Port 1 View  Result Date: 09/02/2016 CLINICAL DATA:  Endotracheal tube placement EXAM: PORTABLE CHEST 1 VIEW COMPARISON:  09/02/2016 FINDINGS: Interval placement of an endotracheal tube. The tip measures about 8 mm above the carina and is directed towards right mainstem bronchus. Suggest retracting the tube about 2 cm for better placement. Enteric tube tip is off field of view but below the left hemidiaphragm. Left central venous catheter with tip over the cavoatrial junction region. Right central venous catheter with tip over the low SVC region. No pneumothorax. Normal heart size and pulmonary vascularity. No focal airspace disease or consolidation in the lungs. Edema pattern seen previously is improving. Minimal bilateral pleural effusions. Esophageal hiatal hernia behind the heart. Postoperative changes in the mediastinum. IMPRESSION: Endotracheal tube tip measures about 8 mm above the carina and is directed towards the right mainstem bronchus. Suggest retraction about 2 cm. Appliances otherwise appear in satisfactory position. Probable small bilateral pleural effusions. Electronically Signed   By: Lucienne Capers M.D.   On: 09/02/2016 21:06   Dg Abd Portable 1v  Result Date: 09/02/2016 CLINICAL DATA:  Encounter for enteric feeding tube placement. EXAM: PORTABLE ABDOMEN - 1 VIEW COMPARISON:  09/01/2016 FINDINGS: Enteric tube now arises tip projecting within the mid to distal stomach. Normal bowel gas pattern. IMPRESSION: Enteric tube feeding tip projects in the mid to distal stomach. Electronically Signed   By: Lajean Manes M.D.   On: 09/02/2016 14:16    STUDIES:  LHC 9/12: PCI to OM1, severe MR, EF 55 to 60% RHC 9/15: RA  5, RV 37/3/6, PA 38/18/26, PCWP 15, CI 1.9, PVR 3.3 WU TEE 9/15: EF 60 to 65%, flail motion MV with severe MR RUQ ultrasound 9/23 > Gallbladder wall thickening noted, suggestion of sludge, small R effusion, liver unremarkable but left lobe not well visulalized  MICROBIOLOGY: MRSA PCR 9/12:  Negative MRSA PCR 9/15:  Negative Tracheal Asp Ctx 9/15:  Strep agalactiae  Blood Ctx x2 9/15:  Negative  MRSA PCR 9/19:  Negative  Blood culture 9/24: coag neg staph 2/4  ANTIBIOTICS: Zosyn 9/16 - 9/17 Vancomycin 9/16 - 9/17 Rocephin 9/17 >> 9/24 Vancomycin 9/20 (surgical prophylaxis) Vanc 9/25 > Zosyn 9/25 >  Fluconazole 9/25 >   SIGNIFICANT EVENTS: 9/12 Admit to [facility], PCI to [place] 9/14 ETT, CRRT 9/15 to [facility] 9/16 off  insulin gtt 9/20 To OR for Mitral Valve Repair w/ bioprosthetic valve 9/28 To OR for permcath 9/29 Extubated  LINES/TUBES: Right IJ PICC 9/12 - 9/20 Rt femoral introducer 9/15 - 9/20 OETT 8.0 9/14 >> 9/29, 9/30 >> HD cath left IJ 9/14 >> R IJ Introducer/CVL/[place]wan 9/20 >>  9/25 IABP 9/15 >>9/22 Lt femoral a line 9/15 >> 9/25 Y Chest Tube/Mediastinal Chest Tube 9/20 >>out PIV x1 L brachial art line 9/25 > 9/28 R IJ CVL > 9/28 L IJ Permcath >   ASSESSMENT / PLAN:  PULMONARY A: Acute Hypoxic Respiratory Failure - Secondary to acute pulmonary edema, reintubated 9/30 Aspiration risk P:   Defer SBTs while on pressors Will need Tstomy    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 Shock - Combination cardiogenic & septic  P:  Telemetry  Post-op Mgt per CVTS/Cardiology Titrate levo gtt, add vaso ASA, Plavix per cardiology/CVTS  Amio, Milrinone gtt's per cardiology/CVTS  RENAL A:   Acute Renal Failure - Baseline creatinine 1.6-1.7  P:   Nephrology Following Keep even on CVVHD Monitor BMET and UOP Replace electrolytes as needed  GASTROINTESTINAL A:   Transaminitis - peaked 9/23, near complete resolution,  unclear etiology Mild Malnutrition - Related to critical illness New onset a fib P:   Pepcid IV q12hr Replaced NGT - resume TF    HEMATOLOGIC A:   Anemia - Last transfusion 9/20 Leukocytosis - Multifactorial  P:  Trending cell counts w/ CBC SCDs  INFECTIOUS A:   Group B Strep Pneumonia Coag negative staph bacteremia (likely contaminant) Line changes on 9/28 P:   Vanc and zosyn restarted on 9/25 and Fluconazole started on 9/25 > stopped 9/29 but resumed 9/30, high pct noted Re-culture for fever  ENDOCRINE A:   DM - Glucose controlled  P:   Subcutaneous insulin per CVTS Levemir BID  NEUROLOGIC A:   New onset R sided hemiplegia and Preferental gaze to Right (9/25). Cranial CT scan was (-) for acute changes >> resolved without evidence of weakness on 9/29 Acute Encephalopathy - Multifactorial and likely combination toxic metabolic & hypoxia   P:   Follow neuro exam Dc'd ativan Fent prn pain  My cc time x 11m  Kara Mead MD. FCCP. [facility] Pager [phone] If no response call [phone]     09/03/2016, 7:42 AM

## 2016-09-03 NOTE — Progress Notes (Signed)
3 Days Post-Op Procedure(s) (LRB): INSERTION OF DIALYSIS CATHETER LEFT INTERNAL JUGULAR VEIN & INSERTION OF TRIPLE LUMEN RIGHT INTERNAL JUGULAR VEIN (Bilateral) Subjective: reintubated overnight, sedated  Objective: Vital signs in last 24 hours: Temp:  [94.2 F (34.6 C)-97.8 F (36.6 C)] 97.3 F (36.3 C) (10/01 0834) Pulse Rate:  [37-125] 111 (10/01 0820) Cardiac Rhythm: Atrial flutter (10/01 0100) Resp:  [18-38] 25 (10/01 0820) BP: (71-117)/(46-96) 92/57 (10/01 0820) SpO2:  [93 %-100 %] 100 % (10/01 0820) Arterial Line BP: (66-113)/(41-68) 93/68 (10/01 0730) FiO2 (%):  [40 %-55 %] 40 % (10/01 0821) Weight:  [146 lb 13.2 oz (66.6 kg)] 146 lb 13.2 oz (66.6 kg) (10/01 0500)  Hemodynamic parameters for last 24 hours: CVP:  [13 mmHg-24 mmHg] 13 mmHg  Intake/Output from previous day: 09/30 0701 - 10/01 0700 In: 2332.2 [I.V.:1544.2; NG/GT:180; IV Piggyback:608] Out: 2574  Intake/Output this shift: No intake/output data recorded.  General appearance: intubated, sedated Neurologic: sedated Heart: tachy Lungs: rhonchi bilaterally Abdomen: normal findings: not normal- mildly distended with hypoactive BS Wound: clean and dry  Lab Results:  Recent Labs  09/02/16 0350 09/02/16 1705 09/03/16 0330  WBC 22.9*  --  33.0*  HGB 8.5* 8.8* 9.0*  HCT 25.5* 26.0* 27.1*  PLT 248  --  359   BMET:  Recent Labs  09/02/16 1750 09/03/16 0330  NA 135 137  K 3.8 3.5  CL 101 104  CO2 17* 25  GLUCOSE 146* 95  BUN 18 21*  CREATININE 1.27* 1.21*  CALCIUM 8.1* 8.1*    PT/INR: No results for input(s): LABPROT, INR in the last 72 hours. ABG    Component Value Date/Time   PHART 7.491 (H) 09/03/2016 0420   HCO3 23.8 09/03/2016 0420   TCO2 25 09/03/2016 0420   ACIDBASEDEF 4.1 (H) 09/02/2016 1850   O2SAT 99.0 09/03/2016 0420   CBG (last 3)   Recent Labs  09/02/16 1944 09/02/16 2332 09/03/16 0831  GLUCAP 174* 225* 125*    Assessment/Plan: S/P Procedure(s) (LRB): INSERTION OF  DIALYSIS CATHETER LEFT INTERNAL JUGULAR VEIN & INSERTION OF TRIPLE LUMEN RIGHT INTERNAL JUGULAR VEIN (Bilateral) -CV- on milrinone, norepi and vasopressin  Co-ox 58 this morning  RESP- reintubated last night for acute respiratory failure  Vent per CCm  Will need trach next week  RENAL- ARF on CVVH  ID- WBC up to 33K, procalcitonin 16 c/w sepsis, pneumonia most likely underlying cause  vanco and zosyn restarted  ENDO- CBG variable  GI/ Nutrition- Back on TF   LOS: 16 days    Deborah [name] 09/03/2016

## 2016-09-03 NOTE — Progress Notes (Signed)
Subjective: Interval History: more unstable, on pressors,reintub.  Objective: Vital signs in last 24 hours: Temp:  [94.2 F (34.6 C)-97.8 F (36.6 C)] 97.8 F (36.6 C) (10/01 0400) Pulse Rate:  [37-125] 119 (10/01 0730) Resp:  [18-38] 20 (10/01 0730) BP: (70-117)/(46-96) 111/96 (10/01 0730) SpO2:  [93 %-100 %] 100 % (10/01 0730) Arterial Line BP: (66-113)/(41-68) 93/68 (10/01 0730) FiO2 (%):  [40 %-55 %] 40 % (10/01 0530) Weight:  [66.6 kg (146 lb 13.2 oz)] 66.6 kg (146 lb 13.2 oz) (10/01 0500) Weight change: -0.8 kg (-1 lb 12.2 oz)  Intake/Output from previous day: 09/30 0701 - 10/01 0700 In: 2332.2 [I.V.:1544.2; NG/GT:180; IV Piggyback:608] Out: 2574  Intake/Output this shift: No intake/output data recorded.  General appearance: moderately obese and not coop , sedated on vent.   Chest wall: LIJ PC Cardio: S1, S2 normal and systolic murmur: holosystolic 2/6, blowing at apex GI: obese, bs but decreased, mod distension Extremities: edema 2+  Lab Results:  Recent Labs  09/02/16 0350 09/02/16 1705 09/03/16 0330  WBC 22.9*  --  33.0*  HGB 8.5* 8.8* 9.0*  HCT 25.5* 26.0* 27.1*  PLT 248  --  359   BMET:  Recent Labs  09/02/16 1750 09/03/16 0330  NA 135 137  K 3.8 3.5  CL 101 104  CO2 17* 25  GLUCOSE 146* 95  BUN 18 21*  CREATININE 1.27* 1.21*  CALCIUM 8.1* 8.1*   No results for input(s): PTH in the last 72 hours. Iron Studies: No results for input(s): IRON, TIBC, TRANSFERRIN, FERRITIN in the last 72 hours.  Studies/Results: Dg Chest Port 1 View  Result Date: 09/02/2016 CLINICAL DATA:  Repositioning of endotracheal tube. EXAM: PORTABLE CHEST 1 VIEW COMPARISON:  Radiograph of September 02, 2016. FINDINGS: Stable cardiomediastinal silhouette. Status post cardiac valve repair. Endotracheal tube is improved in position, with distal tip 1.5 cm above the carina. Feeding tube is seen entering stomach. Left internal jugular dialysis catheter is unchanged in position.  Right internal jugular catheter is also unchanged with distal tip in expected position of the SVC. No pneumothorax is noted. Mild bibasilar edema or subsegmental atelectasis is noted, with probable minimal associated pleural effusions. Bony thorax is unremarkable. IMPRESSION: Endotracheal tube is improved in position. Otherwise stable support apparatus. Stable bibasilar opacities as described above. Electronically Signed   By: Marijo Conception, M.D.   On: 09/02/2016 21:24   Dg Chest Port 1 View  Result Date: 09/02/2016 CLINICAL DATA:  Endotracheal tube placement EXAM: PORTABLE CHEST 1 VIEW COMPARISON:  09/02/2016 FINDINGS: Interval placement of an endotracheal tube. The tip measures about 8 mm above the carina and is directed towards right mainstem bronchus. Suggest retracting the tube about 2 cm for better placement. Enteric tube tip is off field of view but below the left hemidiaphragm. Left central venous catheter with tip over the cavoatrial junction region. Right central venous catheter with tip over the low SVC region. No pneumothorax. Normal heart size and pulmonary vascularity. No focal airspace disease or consolidation in the lungs. Edema pattern seen previously is improving. Minimal bilateral pleural effusions. Esophageal hiatal hernia behind the heart. Postoperative changes in the mediastinum. IMPRESSION: Endotracheal tube tip measures about 8 mm above the carina and is directed towards the right mainstem bronchus. Suggest retraction about 2 cm. Appliances otherwise appear in satisfactory position. Probable small bilateral pleural effusions. Electronically Signed   By: Lucienne Capers M.D.   On: 09/02/2016 21:06   Dg Chest Port 1 View  Result Date:  09/02/2016 CLINICAL DATA:  Secretions from being intubated. Difficulty breathing. EXAM: PORTABLE CHEST 1 VIEW COMPARISON:  August 31, 2016 FINDINGS: Increasing layering effusion and underlying atelectasis on the right greater than the left.  Cardiomegaly. Stable double lumen subclavian catheter. No pneumothorax. No pulmonary nodules or masses. Probable mild edema. No other interval changes. IMPRESSION: Mild edema with small bilateral effusions and underlying atelectasis, worsened in the interval. Electronically Signed   By: Dorise Bullion III M.D   On: 09/02/2016 08:11   Dg Abd Portable 1v  Result Date: 09/02/2016 CLINICAL DATA:  Encounter for enteric feeding tube placement. EXAM: PORTABLE ABDOMEN - 1 VIEW COMPARISON:  09/01/2016 FINDINGS: Enteric tube now arises tip projecting within the mid to distal stomach. Normal bowel gas pattern. IMPRESSION: Enteric tube feeding tip projects in the mid to distal stomach. Electronically Signed   By: Lajean Manes M.D.   On: 09/02/2016 14:16   Dg Abd Portable 1v  Result Date: 09/01/2016 CLINICAL DATA:  Feeding tube placement EXAM: PORTABLE ABDOMEN - 1 VIEW COMPARISON:  Three days ago FINDINGS: Feeding tube tip at the level the distal duodenum. A nasogastric tube is been removed. Central line tips overlaps the upper right atrium. Cardiomegaly. Mitral valve repair. Hazy basilar opacity accentuated by motion artifact. Visualized bowel gas pattern is nonobstructive. IMPRESSION: Feeding tube tip is at the distal duodenum. Electronically Signed   By: Monte Fantasia M.D.   On: 09/01/2016 15:04    I have reviewed the patient's current medications.  Assessment/Plan: 1 AKI oliguric ATN.  Acid/base/K ok  Not sure if bicarb yest reflect of sepsis or incorrect. Will keep even with lower bp.  Has some xs vol, CVP high.  Replete phos 2 Anemia stable. 3 low bps c/w sepsis on AB 4 Resp failure 5 DM controlled 6 CAD 7 MVR concern with infx 8 obesity 9 nutrition tf P even, replete phos, AB,dose adjust, TF    LOS: 16 days   Deborah [name] L 09/03/2016,7:42 AM

## 2016-09-03 NOTE — Progress Notes (Signed)
[facility] Progress Note Patient Name: Deborah [name] DOB: 01-Sep-1956 MRN: [id number]   Date of Service  09/03/2016  HPI/Events of Note    eICU Interventions  Restraints renewed     Intervention Category Evaluation Type: Other  [name] S. 09/03/2016, 9:29 PM

## 2016-09-04 ENCOUNTER — Inpatient Hospital Stay (HOSPITAL_COMMUNITY): Payer: Medicaid Other

## 2016-09-04 DIAGNOSIS — I480 Paroxysmal atrial fibrillation: Secondary | ICD-10-CM

## 2016-09-04 DIAGNOSIS — I4891 Unspecified atrial fibrillation: Secondary | ICD-10-CM

## 2016-09-04 DIAGNOSIS — Z952 Presence of prosthetic heart valve: Secondary | ICD-10-CM

## 2016-09-04 LAB — COMPREHENSIVE METABOLIC PANEL
ALBUMIN: 2.5 g/dL — AB (ref 3.5–5.0)
ALK PHOS: 140 U/L — AB (ref 38–126)
ALT: 78 U/L — ABNORMAL HIGH (ref 14–54)
AST: 55 U/L — AB (ref 15–41)
Anion gap: 9 (ref 5–15)
BILIRUBIN TOTAL: 1.5 mg/dL — AB (ref 0.3–1.2)
BUN: 27 mg/dL — AB (ref 6–20)
CO2: 23 mmol/L (ref 22–32)
Calcium: 8 mg/dL — ABNORMAL LOW (ref 8.9–10.3)
Chloride: 102 mmol/L (ref 101–111)
Creatinine, Ser: 1.33 mg/dL — ABNORMAL HIGH (ref 0.44–1.00)
GFR calc Af Amer: 50 mL/min — ABNORMAL LOW (ref >60)
GFR calc non Af Amer: 43 mL/min — ABNORMAL LOW (ref >60)
GLUCOSE: 211 mg/dL — AB (ref 65–99)
Potassium: 4.1 mmol/L (ref 3.5–5.1)
SODIUM: 134 mmol/L — AB (ref 135–145)
TOTAL PROTEIN: 6.2 g/dL — AB (ref 6.5–8.1)

## 2016-09-04 LAB — POCT I-STAT 3, ART BLOOD GAS (G3+)
BICARBONATE: 23.4 mmol/L (ref 20.0–28.0)
O2 Saturation: 99 %
PCO2 ART: 31.8 mmHg — AB (ref 32.0–48.0)
PH ART: 7.475 — AB (ref 7.350–7.450)
Patient temperature: 98.7
TCO2: 24 mmol/L (ref 0–100)
pO2, Arterial: 115 mmHg — ABNORMAL HIGH (ref 83.0–108.0)

## 2016-09-04 LAB — GLUCOSE, CAPILLARY
GLUCOSE-CAPILLARY: 139 mg/dL — AB (ref 65–99)
GLUCOSE-CAPILLARY: 81 mg/dL (ref 65–99)
GLUCOSE-CAPILLARY: 208 mg/dL — AB (ref 65–99)
Glucose-Capillary: 125 mg/dL — ABNORMAL HIGH (ref 65–99)
Glucose-Capillary: 167 mg/dL — ABNORMAL HIGH (ref 65–99)
Glucose-Capillary: 204 mg/dL — ABNORMAL HIGH (ref 65–99)

## 2016-09-04 LAB — RENAL FUNCTION PANEL
ANION GAP: 10 (ref 5–15)
Albumin: 2.8 g/dL — ABNORMAL LOW (ref 3.5–5.0)
BUN: 22 mg/dL — ABNORMAL HIGH (ref 6–20)
CHLORIDE: 102 mmol/L (ref 101–111)
CO2: 23 mmol/L (ref 22–32)
Calcium: 8.5 mg/dL — ABNORMAL LOW (ref 8.9–10.3)
Creatinine, Ser: 1.31 mg/dL — ABNORMAL HIGH (ref 0.44–1.00)
GFR calc Af Amer: 51 mL/min — ABNORMAL LOW (ref >60)
GFR calc non Af Amer: 44 mL/min — ABNORMAL LOW (ref >60)
GLUCOSE: 95 mg/dL (ref 65–99)
Phosphorus: 2.5 mg/dL (ref 2.5–4.6)
Potassium: 3.9 mmol/L (ref 3.5–5.1)
Sodium: 135 mmol/L (ref 135–145)

## 2016-09-04 LAB — CARBOXYHEMOGLOBIN
Carboxyhemoglobin: 1.2 % (ref 0.5–1.5)
Carboxyhemoglobin: 1.2 % (ref 0.5–1.5)
METHEMOGLOBIN: 0.8 % (ref 0.0–1.5)
Methemoglobin: 0.8 % (ref 0.0–1.5)
O2 Saturation: 40.8 %
O2 Saturation: 48.6 %
Total hemoglobin: 9.7 g/dL — ABNORMAL LOW (ref 12.0–16.0)
Total hemoglobin: 9.1 g/dL — ABNORMAL LOW (ref 12.0–16.0)

## 2016-09-04 LAB — CBC
HEMATOCRIT: 26 % — AB (ref 36.0–46.0)
HEMOGLOBIN: 8.3 g/dL — AB (ref 12.0–15.0)
MCH: 29.2 pg (ref 26.0–34.0)
MCHC: 31.9 g/dL (ref 30.0–36.0)
MCV: 91.5 fL (ref 78.0–100.0)
Platelets: 325 10*3/uL (ref 150–400)
RBC: 2.84 MIL/uL — ABNORMAL LOW (ref 3.87–5.11)
RDW: 18.1 % — AB (ref 11.5–15.5)
WBC: 24 10*3/uL — AB (ref 4.0–10.5)

## 2016-09-04 LAB — PROTIME-INR
INR: 1.92
Prothrombin Time: 22.2 seconds — ABNORMAL HIGH (ref 11.4–15.2)

## 2016-09-04 LAB — HEPARIN LEVEL (UNFRACTIONATED): Heparin Unfractionated: 0.83 IU/mL — ABNORMAL HIGH (ref 0.30–0.70)

## 2016-09-04 LAB — PHOSPHORUS: Phosphorus: 2.7 mg/dL (ref 2.5–4.6)

## 2016-09-04 LAB — MAGNESIUM: Magnesium: 2.4 mg/dL (ref 1.7–2.4)

## 2016-09-04 LAB — POCT ACTIVATED CLOTTING TIME
ACTIVATED CLOTTING TIME: 202 s
ACTIVATED CLOTTING TIME: 202 s
ACTIVATED CLOTTING TIME: 202 s
Activated Clotting Time: 197 seconds
Activated Clotting Time: 197 seconds
Activated Clotting Time: 202 seconds
Activated Clotting Time: 208 seconds

## 2016-09-04 LAB — PROCALCITONIN: Procalcitonin: 33.02 ng/mL

## 2016-09-04 LAB — VANCOMYCIN, TROUGH: Vancomycin Tr: 18 ug/mL (ref 15–20)

## 2016-09-04 MED ORDER — FLUCONAZOLE IN SODIUM CHLORIDE 100-0.9 MG/50ML-% IV SOLN
100.0000 mg | INTRAVENOUS | Status: DC
  Administered 2016-09-04 – 2016-09-06 (×3): 100 mg via INTRAVENOUS
  Filled 2016-09-04 (×4): qty 50

## 2016-09-04 MED ORDER — ALBUMIN HUMAN 25 % IV SOLN
12.5000 g | Freq: Four times a day (QID) | INTRAVENOUS | Status: AC
  Administered 2016-09-04 – 2016-09-05 (×5): 12.5 g via INTRAVENOUS
  Filled 2016-09-04 (×5): qty 50

## 2016-09-04 MED ORDER — MIDAZOLAM HCL 2 MG/2ML IJ SOLN
5.0000 mg | Freq: Once | INTRAMUSCULAR | Status: AC
  Administered 2016-09-04: 5 mg via INTRAVENOUS
  Filled 2016-09-04: qty 6

## 2016-09-04 MED ORDER — NEPRO/CARBSTEADY PO LIQD
1000.0000 mL | ORAL | Status: DC
  Administered 2016-09-04 – 2016-09-05 (×3): 1000 mL via ORAL
  Filled 2016-09-04 (×4): qty 1000

## 2016-09-04 MED ORDER — SODIUM CHLORIDE 0.9% FLUSH
3.0000 mL | Freq: Two times a day (BID) | INTRAVENOUS | Status: DC
  Administered 2016-09-04 – 2016-09-05 (×2): 3 mL via INTRAVENOUS

## 2016-09-04 MED ORDER — SODIUM CHLORIDE 0.9% FLUSH: 3.0000 mL | INTRAVENOUS | Status: DC | PRN

## 2016-09-04 MED ORDER — ALBUMIN HUMAN 5 % IV SOLN
12.5000 g | Freq: Once | INTRAVENOUS | Status: AC
  Administered 2016-09-04: 12.5 g via INTRAVENOUS
  Filled 2016-09-04: qty 250

## 2016-09-04 MED ORDER — AMIODARONE IV BOLUS ONLY 150 MG/100ML
150.0000 mg | Freq: Once | INTRAVENOUS | Status: AC
  Administered 2016-09-04: 150 mg via INTRAVENOUS

## 2016-09-04 MED ORDER — SODIUM CHLORIDE 0.9 % IV SOLN
INTRAVENOUS | Status: DC
  Administered 2016-09-05: 16:00:00 via INTRAVENOUS
  Administered 2016-09-08: 10 mL via INTRAVENOUS
  Administered 2016-09-17: 10 mL/h via INTRAVENOUS

## 2016-09-04 MED ORDER — SODIUM CHLORIDE 0.9 % IV SOLN: 250.0000 mL | INTRAVENOUS | Status: DC

## 2016-09-04 MED ORDER — CALCIUM CHLORIDE 10 % IV SOLN
1.0000 g | Freq: Once | INTRAVENOUS | Status: AC
  Administered 2016-09-04: 1 g via INTRAVENOUS
  Filled 2016-09-04: qty 10

## 2016-09-04 MED ORDER — DARBEPOETIN ALFA 100 MCG/0.5ML IJ SOSY
100.0000 ug | PREFILLED_SYRINGE | INTRAMUSCULAR | Status: DC
  Administered 2016-09-04 – 2016-09-25 (×3): 100 ug via SUBCUTANEOUS
  Filled 2016-09-04 (×4): qty 0.5

## 2016-09-04 NOTE — Progress Notes (Signed)
PT Cancellation Note  Patient Details Name: Deborah [name] MRN: [id number] DOB: 03-Mar-1956   Cancelled Treatment:    Reason Eval/Treat Not Completed: Medical issues which prohibited therapy (pt reintubated since PT ordered and not medically ready for eval at this time. Will sign off and await new order)   [name] 09/04/2016, 7:31 AM Elwyn Reach, [place]

## 2016-09-04 NOTE — CV Procedure (Signed)
    TRANSESOPHAGEAL ECHOCARDIOGRAM GUIDED DIRECT CURRENT CARDIOVERSION  NAME:  Deborah [name]   MRN: [id number] DOB:  [birth year]   ADMIT DATE: 08/18/2016  INDICATIONS: Atrial fib   PROCEDURE:   The patient was intubated and sedated. Informed consent was obtained prior to the procedure from the patient's family. The risks, benefits and alternatives for the procedure were discussed and the patient comprehended these risks.  Risks include, but are not limited to, cough, sore throat, vomiting, nausea, somnolence, esophageal and stomach trauma or perforation, bleeding, low blood pressure, aspiration, pneumonia, infection, trauma to the teeth and death.    After a procedural time-out, the patient was given IV versed and  fentanyl for moderate sedation. The transesophageal probe was inserted in the esophagus and stomach without difficulty and multiple views were obtained.   Images very limited due to shadowing from MVR.    FINDINGS:  LEFT VENTRICLE: EF = 60%   RIGHT VENTRICLE: Normal size and function  LEFT ATRIUM: Dialted  LEFT ATRIAL APPENDAGE: No clot  RIGHT ATRIUM: Normal  AORTIC VALVE:  Trileaflet. No AI/AS  MITRAL VALVE:    s/p bioprosthetic MVR. The MVR was well-seated and functioning well. Trivial MR. ? 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 Trivial TR   PULMONIC VALVE: not visualized  INTERATRIAL SEPTUM: normal bows to R. No PFO or ASD  PERICARDIUM: small to moderate effusion   DESCENDING AORTA: Not visualized   CARDIOVERSION:     Indications:  Atrial Fibrillation  Procedure Details:  Once the TEE was complete, the patient had the defibrillator pads placed in the anterior and posterior position.  Once an appropriate level of sedation was achieved, the patient received a single biphasic, synchronized 150J shock with prompt conversion to sinus rhythm. No apparent complications.  [name],  [name],MD 2:38 PM

## 2016-09-04 NOTE — Progress Notes (Signed)
2mg  IV Versed; 50 mcg IV Fent wasted with Kathleen Argue, RN.  Ruben Reason

## 2016-09-04 NOTE — Progress Notes (Signed)
4 Days Post-Op Procedure(s) (LRB): INSERTION OF DIALYSIS CATHETER LEFT INTERNAL JUGULAR VEIN & INSERTION OF TRIPLE LUMEN RIGHT INTERNAL JUGULAR VEIN (Bilateral) Subjective: Patient reintubated for possible HCAP-sepsis Mixed venous saturation low this a.m. and patient cardioverted out of atrial fibrillation TEE shows good mitral valve function with mild LV dysfunction Patient continues on CVVH for acute renal failure Objective: Vital signs in last 24 hours: Temp:  [97.4 F (36.3 C)-99.2 F (37.3 C)] 97.4 F (36.3 C) (10/02 1515) Pulse Rate:  [45-120] 87 (10/02 1845) Cardiac Rhythm: Normal sinus rhythm (10/02 1800) Resp:  [13-35] 29 (10/02 1845) BP: (95-133)/(58-112) 123/68 (10/02 1830) SpO2:  [50 %-100 %] 100 % (10/02 1845) Arterial Line BP: (84-143)/(50-79) 113/64 (10/02 1845) FiO2 (%):  [40 %] 40 % (10/02 1800) Weight:  [149 lb 0.5 oz (67.6 kg)] 149 lb 0.5 oz (67.6 kg) (10/02 0435)  Hemodynamic parameters for last 24 hours: CVP:  [12 mmHg-17 mmHg] 17 mmHg  Intake/Output from previous day: 10/01 0701 - 10/02 0700 In: 3410.6 [I.V.:1602.6; NG/GT:1100; IV Piggyback:708] Out: 1791 [Stool:525] Intake/Output this shift: No intake/output data recorded.  Patient alert on vent opens eyes Breath sounds clear Sternotomy incision clean and well-healed Abdomen soft Extremities with minimal edema and warm  Lab Results:  Recent Labs  09/03/16 0330 09/03/16 1626 09/04/16 0415  WBC 33.0*  --  24.0*  HGB 9.0* 9.9* 8.3*  HCT 27.1* 29.0* 26.0*  PLT 359  --  325   BMET:  Recent Labs  09/04/16 0415 09/04/16 1600  NA 134* 135  K 4.1 3.9  CL 102 102  CO2 23 23  GLUCOSE 211* 95  BUN 27* 22*  CREATININE 1.33* 1.31*  CALCIUM 8.0* 8.5*    PT/INR:  Recent Labs  09/04/16 1130  LABPROT 22.2*  INR 1.92   ABG    Component Value Date/Time   PHART 7.475 (H) 09/04/2016 0019   HCO3 23.4 09/04/2016 0019   TCO2 24 09/04/2016 0019   ACIDBASEDEF 4.1 (H) 09/02/2016 1850   O2SAT  48.6 09/04/2016 0825   CBG (last 3)   Recent Labs  09/04/16 0823 09/04/16 1157 09/04/16 1547  GLUCAP 125* 139* 81    Assessment/Plan: S/P Procedure(s) (LRB): INSERTION OF DIALYSIS CATHETER LEFT INTERNAL JUGULAR VEIN & INSERTION OF TRIPLE LUMEN RIGHT INTERNAL JUGULAR VEIN (Bilateral) Continue with broad-spectrum antibiotics, Diflucan added today Wean vasopressin as tolerated to maintain systolic blood pressure greater than 95   LOS: 17 days    Tharon Aquas Trigt III 09/04/2016

## 2016-09-04 NOTE — Care Management Note (Signed)
Case Management Note  Patient Details  Name: Deborah [name] MRN: [id number] Date of Birth: [birth year]   Subjective/Objective:    Copied from previous CM note from [place] 08/17/16            RNCM met with patient's son [name] [phone] and daughter [name] [phone] to discuss discharge planning. Patient is vented currently and son [name] is struggling with patient's current status. I and patient's RN spoke with him to try to comfort him- he appreciate it. [name] would like to be first line of contact for any medical needs. Patient is from home with [name]. She was independent with daily activity. Her PCP is Ronette Deter. Patient had been going to [facility] but per [name] "they were not meeting her mother's medical needs". [name] has been paying for physician visits for her mother. Patient is potentially going to need outpatient dialysis. She was not taking her medications per [name] due to cost- patient does not have health insurance. [name] wants her mother to have home health services- [facility] nursing may be appropriate however it is too soon to make those arrangements. Both children are considering SNF if patient too weak to return home. They have met with financial advisors about Medicare and Medicaid per Teresa/Frank.   Action/Plan: Application to [facility] and Medication Management shared with [name]. RNCM to continue to follow.   Expected Discharge Date:                  Expected Discharge Plan:  [place]  In-House Referral:     Discharge planning Services     Post Acute Care Choice:    Choice offered to:     DME Arranged:    DME Agency:     HH Arranged:    [place] Agency:     Status of Service:  In process, will continue to follow  If discussed at Long Length of Stay Meetings, dates discussed:    Additional Comments: 09/04/2016 Elenor Quinones, RN, BSN [phone] Pt is now extubated on [place],  remains on CRRT, continuous Milrinone, Amiodarone, Precedex d/c today at extubation.   Per Development worker, community note ; medicaid application has been submitted  09/04/2016  CM spoke in depth with sister and brother in law - they feel that pt will beneift from post acute care facility due to lack of help in the home.  Per sister; pt stays with adult children but projected required help at home will not be sufficient at discharge.  CM discussed with family post acute discharge options including brief overview or LTACH and SNF in general (choice not given).  CM informed family that discharge determination will be followed closely and will be managed daily depending on progress.  Pt is now on vent, CRRT, pressors.  09/01/16 Elenor Quinones, RN, BSN [phone] Discussed in LOS 08/29/16:  Pt remains appropriate for continued stay.  Pt remains on CRRT and ventilator.  Per attending; If no significant improvement in 24-48hrs, anticipate pt will need a trache/PEG.  She still has volume issues and her decreased mental status make it difficult to wean/extubate safely.  08/24/16 Elenor Quinones, RN, BSN [phone] Pt now transferred to 2S s/p  MVR.  Postoperative management of balloon pump, vasopressors, and chest tube per CV S and cardiology. Patient remains critically ill on ventilator - may need CRRT.  CM will continue to follow for discharge needs Kimball Manske, Abelino Derrick, RN  09/04/2016, 10:38 AM

## 2016-09-04 NOTE — Progress Notes (Signed)
Subjective:  Remains critically ill overnight- CRRT running well  Objective Vital signs in last 24 hours: Vitals:   09/04/16 0730 09/04/16 0745 09/04/16 0800 09/04/16 0824  BP: 124/63  (!) 108/58   Pulse:   (!) 113   Resp: 17 (!) 21 (!) 22   Temp:    99.1 F (37.3 C)  TempSrc:    Axillary  SpO2:   100%   Weight:      Height:       Weight change: 1 kg (2 lb 3.3 oz)  Intake/Output Summary (Last 24 hours) at 09/04/16 0834 Last data filed at 09/04/16 8280  Gross per 24 hour  Intake          3863.18 ml  Output             3989 ml  Net          -125.82 ml    Assessment/ Plan: Pt is a 60 y.o. yo female who was admitted on 08/18/2016 with STEMI- then severe MR- s/p stenting and eventually MVR - clinical decline of late with reintubation- possible PNA Assessment/Plan: 1. Renal- A on CRF- essentially on CRRT since 9/14- most recent dialysis catheter in since 9/28- still unstable requiring pressors.  Givne this prolonged AKI - HD dep for over 2 weeks would think renal recovery is becoming less likely as time passes  2. HTN/vol-  Running even with CRRT- peripheral edema with low alb- CVP in the low teens -CTS giving albumin - OK to give separate from CRRT  3. Anemia- this AM in the 8's- will give ESA 4. Secondary hyperparathyroidism- s/p phos repletion- no binders- K is stable  5. CV- per CTS- s/p MVR - atrial arrythmias- on pressors 6. ID- high WBC on vanc /zosyn   Dayshon Roback A    Labs: Basic Metabolic Panel:  Recent Labs Lab 09/03/16 0330 09/03/16 1600 09/03/16 1626 09/04/16 0415  NA 137 135 137 134*  K 3.5 3.8 3.8 4.1  CL 104 102  --  102  CO2 25 23  --  23  GLUCOSE 95 133* 134* 211*  BUN 21* 22*  --  27*  CREATININE 1.21* 1.22*  --  1.33*  CALCIUM 8.1* 8.1*  --  8.0*  PHOS 2.1* 4.1  --  2.7   Liver Function Tests:  Recent Labs Lab 09/02/16 1750 09/03/16 0330 09/03/16 1600 09/04/16 0415  AST 45* 42*  --  55*  ALT 87* 82*  --  78*  ALKPHOS 109 119  --   140*  BILITOT 1.7* 1.4*  --  1.5*  PROT 6.4* 6.7  --  6.2*  ALBUMIN 2.7* 2.6* 2.6* 2.5*   No results for input(s): LIPASE, AMYLASE in the last 168 hours.  Recent Labs Lab 09/02/16 1752  AMMONIA 23   CBC:  Recent Labs Lab 08/31/16 0336  09/01/16 0500  09/02/16 0350  09/03/16 0330 09/03/16 1626 09/04/16 0415  WBC 20.5*  --  20.2*  --  22.9*  --  33.0*  --  24.0*  HGB 8.6*  < > 8.1*  < > 8.5*  < > 9.0* 9.9* 8.3*  HCT 26.5*  < > 25.0*  < > 25.5*  < > 27.1* 29.0* 26.0*  MCV 89.2  --  89.6  --  89.2  --  89.7  --  91.5  PLT 157  --  189  --  248  --  359  --  325  < > = values in this  interval not displayed. Cardiac Enzymes: No results for input(s): CKTOTAL, CKMB, CKMBINDEX, TROPONINI in the last 168 hours. CBG:  Recent Labs Lab 09/03/16 1221 09/03/16 2014 09/04/16 0003 09/04/16 0420 09/04/16 0823  GLUCAP 199* 107* 167* 204* 125*    Iron Studies: No results for input(s): IRON, TIBC, TRANSFERRIN, FERRITIN in the last 72 hours. Studies/Results: Dg Chest Port 1 View  Result Date: 09/04/2016 CLINICAL DATA:  Hypoxia EXAM: PORTABLE CHEST 1 VIEW COMPARISON:  September 02, 2016 FINDINGS: Endotracheal tube tip is 1.4 cm above the carina. Central catheter tips are in the superior vena cava within dual-lumen catheter tip essentially at the cavoatrial junction. Feeding tube tip below the diaphragm and not seen. Temporary pacemaker wires are attached to the right heart. No pneumothorax. There is atelectatic change in the left base. There is a questionable small right pleural effusion. Lungs elsewhere clear. Heart is upper normal in size with pulmonary vascularity within normal limits. Patient is status post mitral valve replacement. IMPRESSION: Tube and catheter positions as described without pneumothorax. Small right pleural effusion. Stable left base atelectasis. Stable cardiac silhouette. Electronically Signed   By: Lowella Grip III M.D.   On: 09/04/2016 08:32   Dg Chest Port 1  View  Result Date: 09/02/2016 CLINICAL DATA:  Repositioning of endotracheal tube. EXAM: PORTABLE CHEST 1 VIEW COMPARISON:  Radiograph of September 02, 2016. FINDINGS: Stable cardiomediastinal silhouette. Status post cardiac valve repair. Endotracheal tube is improved in position, with distal tip 1.5 cm above the carina. Feeding tube is seen entering stomach. Left internal jugular dialysis catheter is unchanged in position. Right internal jugular catheter is also unchanged with distal tip in expected position of the SVC. No pneumothorax is noted. Mild bibasilar edema or subsegmental atelectasis is noted, with probable minimal associated pleural effusions. Bony thorax is unremarkable. IMPRESSION: Endotracheal tube is improved in position. Otherwise stable support apparatus. Stable bibasilar opacities as described above. Electronically Signed   By: Marijo Conception, M.D.   On: 09/02/2016 21:24   Dg Chest Port 1 View  Result Date: 09/02/2016 CLINICAL DATA:  Endotracheal tube placement EXAM: PORTABLE CHEST 1 VIEW COMPARISON:  09/02/2016 FINDINGS: Interval placement of an endotracheal tube. The tip measures about 8 mm above the carina and is directed towards right mainstem bronchus. Suggest retracting the tube about 2 cm for better placement. Enteric tube tip is off field of view but below the left hemidiaphragm. Left central venous catheter with tip over the cavoatrial junction region. Right central venous catheter with tip over the low SVC region. No pneumothorax. Normal heart size and pulmonary vascularity. No focal airspace disease or consolidation in the lungs. Edema pattern seen previously is improving. Minimal bilateral pleural effusions. Esophageal hiatal hernia behind the heart. Postoperative changes in the mediastinum. IMPRESSION: Endotracheal tube tip measures about 8 mm above the carina and is directed towards the right mainstem bronchus. Suggest retraction about 2 cm. Appliances otherwise appear in  satisfactory position. Probable small bilateral pleural effusions. Electronically Signed   By: Lucienne Capers M.D.   On: 09/02/2016 21:06   Dg Abd Portable 1v  Result Date: 09/03/2016 CLINICAL DATA:  Encounter for feeding tube placement. Hx of DM, HTN EXAM: PORTABLE ABDOMEN - 1 VIEW COMPARISON:  09/02/2016 FINDINGS: Feeding tube with tip in the gastric antrum/ pyloric region. Normal bowel-gas pattern. IMPRESSION: Feeding tube with tip in the gastric pyloric region. Electronically Signed   By: Suzy Bouchard M.D.   On: 09/03/2016 16:24   Dg Abd Portable 1v  Result  Date: 09/02/2016 CLINICAL DATA:  Encounter for enteric feeding tube placement. EXAM: PORTABLE ABDOMEN - 1 VIEW COMPARISON:  09/01/2016 FINDINGS: Enteric tube now arises tip projecting within the mid to distal stomach. Normal bowel gas pattern. IMPRESSION: Enteric tube feeding tip projects in the mid to distal stomach. Electronically Signed   By: Lajean Manes M.D.   On: 09/02/2016 14:16   Medications: Infusions: . sodium chloride Stopped (08/27/16 0900)  . sodium chloride    . sodium chloride 20 mL/hr at 09/04/16 0800  . amiodarone 30 mg/hr (09/04/16 0800)  . heparin 10,000 units/ 20 mL infusion syringe 1,350 Units/hr (09/04/16 0658)  . lidocaine Stopped (08/29/16 0900)  . milrinone 0.252 mcg/kg/min (09/04/16 0800)  . norepinephrine (LEVOPHED) Adult infusion 13.013 mcg/min (09/04/16 0800)  . dialysis replacement fluid (prismasate) 600 mL/hr at 09/04/16 0606  . dialysis replacement fluid (prismasate) 200 mL/hr at 09/03/16 1703  . dialysate (PRISMASATE) 1,000 mL/hr at 09/04/16 0755  . vasopressin (PITRESSIN) infusion - *FOR SHOCK* 0.03 Units/min (09/04/16 0800)    Scheduled Medications: . albumin human  12.5 g Intravenous Q6H  . atorvastatin  20 mg Oral q1800  . calcium chloride  1 g Intravenous Once  . chlorhexidine gluconate (MEDLINE KIT)  15 mL Mouth Rinse BID  . clopidogrel  75 mg Oral Daily  . famotidine (PEPCID) IV   20 mg Intravenous Q12H  . feeding supplement (NEPRO CARB STEADY)  1,000 mL Oral Q24H  . feeding supplement (PRO-STAT SUGAR FREE 64)  30 mL Per Tube TID  . insulin aspart  0-24 Units Subcutaneous Q4H  . insulin detemir  10 Units Subcutaneous BID  . mouth rinse  15 mL Mouth Rinse 10 times per day  . piperacillin-tazobactam  3.375 g Intravenous Q6H  . sodium chloride flush  3 mL Intravenous Q12H  . vancomycin  750 mg Intravenous Q24H    have reviewed scheduled and prn medications.  Physical Exam: General: sedated- but arousable Heart: tachy Lungs: CBS bilat Abdomen: soft, non tender Extremities: pitting edema Dialysis Access: left IJ vascath placed 9/28    09/04/2016,8:34 AM  LOS: 17 days

## 2016-09-04 NOTE — Progress Notes (Signed)
RN spoke with daughter Tharon Kitch via phone; consent obtained for cardioversion; will cont. To monitor.  Ruben Reason

## 2016-09-04 NOTE — Progress Notes (Signed)
Patient ID: GLORYA BARTLEY, female   DOB: 05-18-1956, 60 y.o.   MRN: [id number] EVENING ROUNDS NOTE :     [address]       [place] [address]             [phone]                 4 Days Post-Op Procedure(s) (LRB): INSERTION OF DIALYSIS CATHETER LEFT INTERNAL JUGULAR VEIN & INSERTION OF TRIPLE LUMEN RIGHT INTERNAL JUGULAR VEIN (Bilateral)  Total Length of Stay:  LOS: 17 days  BP 116/63   Pulse 87   Temp 97.4 F (36.3 C) (Oral)   Resp (!) 21   Ht 5\' 1"  (1.549 m)   Wt 149 lb 0.5 oz (67.6 kg)   SpO2 100%   BMI 28.16 kg/m   .Intake/Output      10/02 0701 - 10/03 0700   I.V. (mL/kg) 820 (12.1)   NG/GT 479.3   IV Piggyback 760   Total Intake(mL/kg) 2059.4 (30.5)   Urine (mL/kg/hr)    Emesis/NG output 0 (0)   Other 1673 (1.8)   Stool 300 (0.3)   Total Output 1973   Net +86.4         . sodium chloride Stopped (08/27/16 0900)  . sodium chloride    . sodium chloride 20 mL/hr at 09/04/16 1900  . sodium chloride    . sodium chloride    . amiodarone 30 mg/hr (09/04/16 1900)  . heparin 10,000 units/ 20 mL infusion syringe 1,350 Units/hr (09/04/16 2000)  . lidocaine Stopped (08/29/16 0900)  . milrinone 0.252 mcg/kg/min (09/04/16 1900)  . norepinephrine (LEVOPHED) Adult infusion 8 mcg/min (09/04/16 1918)  . dialysis replacement fluid (prismasate) 600 mL/hr at 09/04/16 1356  . dialysis replacement fluid (prismasate) 200 mL/hr at 09/04/16 1855  . dialysate (PRISMASATE) 1,000 mL/hr at 09/04/16 1800  . vasopressin (PITRESSIN) infusion - *FOR SHOCK* 0.03 Units/min (09/04/16 1900)     Lab Results  Component Value Date   WBC 24.0 (H) 09/04/2016   HGB 8.3 (L) 09/04/2016   HCT 26.0 (L) 09/04/2016   PLT 325 09/04/2016   GLUCOSE 95 09/04/2016   CHOL 207 (H) 07/09/2014   TRIG 174.0 (H) 07/09/2014   HDL 40.60 07/09/2014   LDLCALC 132 (H) 07/09/2014   ALT 78 (H) 09/04/2016   AST 55 (H) 09/04/2016   NA 135 09/04/2016   K 3.9 09/04/2016   CL 102 09/04/2016   CREATININE 1.31 (H) 09/04/2016   BUN 22 (H) 09/04/2016   CO2 23 09/04/2016   TSH 1.485 Test methodology is 3rd generation TSH 01/26/2010   INR 1.92 09/04/2016   HGBA1C 13.3 (H) 08/15/2016   MICROALBUR 15.8 (H) 07/09/2014   remains on vent  cvvh Decreasing level of levophed   Grace Isaac MD  Beeper [phone] Office [phone] 09/04/2016 8:27 PM

## 2016-09-04 NOTE — Progress Notes (Signed)
SLP Cancellation Note  Patient Details Name: MONTASIA CHISENHALL MRN: [id number] DOB: October 03, 1956   Cancelled treatment:       Reason Eval/Treat Not Completed: Medical issues which prohibited therapy. Pt intubated. Please reorder when ready.    Dessie Tatem, Katherene Ponto 09/04/2016, 7:56 AM

## 2016-09-04 NOTE — Progress Notes (Signed)
  Echocardiogram 2D Echocardiogram has been performed.  Donata Clay 09/04/2016, 3:01 PM

## 2016-09-04 NOTE — Progress Notes (Signed)
Patient ID: Deborah [name], female   DOB: 1956/08/20, 60 y.o.   MRN: [id number]   60 y/o woman with DM2, HTN, HL and noncompliance presented to [facility] ER with infrerior posterior STEMI on 9/12. Taken to cath lab and found to have occluded small to moderate OM-1 branch. Underwent successful PCS with stent x 2 by Dr. Clayborn Bigness. Coronaries otherwise ok. EF 55-60% with severe MR confirmed by echo.   Post-cath developed respiratory failure and required intubation. Developed shock and renal failure with peak creatinine 3.4 (1.6 on admit). Trialysis catheter placed.   Patient persistently acidotic with concern for ischemic MR and transferred here. En route develop severe hypotension and norepinephrine started.   Brought to cath lab 9/15 and TEE showed normal LV (EF 60-65%) and normal RV function with ruptured posterior papillary muscle with severe posterior MR.  Underwent placement of Swan and IABP.   Treated for group B Strep PNA.   S/P MVR 08/23/16.  IABP pulled 9/22.   Echo 9/23  RV and LV are normal (EF 60%) but appeared underfilled   Extubated 9/29.  However, picture consistent with septic shock on 9/30 so pressors started and she was re-intubated.   Remains on milrinone 0.25 + norepi 12 mcg + vasopressin 0.03 units. Blood cultures no growth today. Todays CO-OX 49%.  CVVH but not pulling fluid. Procalcitonin  33.    Scheduled Meds: . albumin human  12.5 g Intravenous Q6H  . atorvastatin  20 mg Oral q1800  . calcium chloride  1 g Intravenous Once  . chlorhexidine gluconate (MEDLINE KIT)  15 mL Mouth Rinse BID  . clopidogrel  75 mg Oral Daily  . darbepoetin (ARANESP) injection - NON-DIALYSIS  100 mcg Subcutaneous Q Mon-1800  . famotidine (PEPCID) IV  20 mg Intravenous Q12H  . feeding supplement (NEPRO CARB STEADY)  1,000 mL Oral Q24H  . feeding supplement (PRO-STAT SUGAR FREE 64)  30 mL Per Tube TID  . fluconazole (DIFLUCAN) IV  100 mg Intravenous Q24H  . insulin aspart  0-24 Units  Subcutaneous Q4H  . insulin detemir  10 Units Subcutaneous BID  . mouth rinse  15 mL Mouth Rinse 10 times per day  . piperacillin-tazobactam  3.375 g Intravenous Q6H  . sodium chloride flush  3 mL Intravenous Q12H  . vancomycin  750 mg Intravenous Q24H   Continuous Infusions: . sodium chloride Stopped (08/27/16 0900)  . sodium chloride    . sodium chloride 20 mL/hr at 09/04/16 0900  . amiodarone 30 mg/hr (09/04/16 0800)  . heparin 10,000 units/ 20 mL infusion syringe 1,350 Units/hr (09/04/16 0900)  . lidocaine Stopped (08/29/16 0900)  . milrinone 0.252 mcg/kg/min (09/04/16 0900)  . norepinephrine (LEVOPHED) Adult infusion 12 mcg/min (09/04/16 0900)  . dialysis replacement fluid (prismasate) 600 mL/hr at 09/04/16 0606  . dialysis replacement fluid (prismasate) 200 mL/hr at 09/03/16 1703  . dialysate (PRISMASATE) 1,000 mL/hr at 09/04/16 0755  . vasopressin (PITRESSIN) infusion - *FOR SHOCK* 0.03 Units/min (09/04/16 0900)   PRN Meds:.sodium chloride, Place/Maintain arterial line **AND** sodium chloride, fentaNYL (SUBLIMAZE) injection, fentaNYL (SUBLIMAZE) injection, heparin, HYDROcodone-acetaminophen, midazolam, midazolam, ondansetron (ZOFRAN) IV, sodium chloride, sodium chloride flush    Vitals:   09/04/16 0824 09/04/16 0830 09/04/16 0845 09/04/16 0900  BP:  109/73  112/72  Pulse:      Resp:  (!) 25 (!) 25 (!) 26  Temp: 99.1 F (37.3 C)     TempSrc: Axillary     SpO2:      Weight:  Height:        Intake/Output Summary (Last 24 hours) at 09/04/16 0921 Last data filed at 09/04/16 0904  Gross per 24 hour  Intake          3859.38 ml  Output             4010 ml  Net          -150.62 ml    LABS: Basic Metabolic Panel:  Recent Labs  09/03/16 0330 09/03/16 1600 09/03/16 1626 09/04/16 0415  NA 137 135 137 134*  K 3.5 3.8 3.8 4.1  CL 104 102  --  102  CO2 25 23  --  23  GLUCOSE 95 133* 134* 211*  BUN 21* 22*  --  27*  CREATININE 1.21* 1.22*  --  1.33*  CALCIUM  8.1* 8.1*  --  8.0*  MG 2.4  --   --  2.4  PHOS 2.1* 4.1  --  2.7   Liver Function Tests:  Recent Labs  09/03/16 0330 09/03/16 1600 09/04/16 0415  AST 42*  --  55*  ALT 82*  --  78*  ALKPHOS 119  --  140*  BILITOT 1.4*  --  1.5*  PROT 6.7  --  6.2*  ALBUMIN 2.6* 2.6* 2.5*   No results for input(s): LIPASE, AMYLASE in the last 72 hours. CBC:  Recent Labs  09/03/16 0330 09/03/16 1626 09/04/16 0415  WBC 33.0*  --  24.0*  HGB 9.0* 9.9* 8.3*  HCT 27.1* 29.0* 26.0*  MCV 89.7  --  91.5  PLT 359  --  325   Cardiac Enzymes: No results for input(s): CKTOTAL, CKMB, CKMBINDEX, TROPONINI in the last 72 hours. BNP: Invalid input(s): POCBNP D-Dimer: No results for input(s): DDIMER in the last 72 hours. Hemoglobin A1C: No results for input(s): HGBA1C in the last 72 hours. Fasting Lipid Panel: No results for input(s): CHOL, HDL, LDLCALC, TRIG, CHOLHDL, LDLDIRECT in the last 72 hours. Thyroid Function Tests: No results for input(s): TSH, T4TOTAL, T3FREE, THYROIDAB in the last 72 hours.  Invalid input(s): FREET3 Anemia Panel: No results for input(s): VITAMINB12, FOLATE, FERRITIN, TIBC, IRON, RETICCTPCT in the last 72 hours.  PHYSICAL EXAM CVP 12  General: Intubated/sedated Neck: RIJ TLC LIJ trialysis  Lungs: Clear anteriorly CV: Nondisplaced PMI.  Heart tachy, regular S1/S2, no S3/S4, 1/6 SEM USB.   Abdomen: Soft, ND, no HSM. No bruits or masses. +BS  Neurologic: Sedated.  Not responding to verbal stimuli.  Extremities: No clubbing or cyanosis. R and LLE SCDs.    TELEMETRY: Afib 105 .   Assessment:   1. Cardiogenic shock: Due to acute severe MR. EF 70% on 9/25 echo.  2. CAD: Inferior posterior STEMI on 9/13 with DES x 2 to OM-1      --Plavix restarted on 9/22 3. 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     --s/p MVR 08/23/2016  4. AKI on CKD stage III - due to ATN likely from cardiogenic  shock + contrast with cath. Has required CVVH.  5. Acute respiratory failure: Pulmonary edema.  6. DM2 7. Suspect component of septic shock: Possible PNA, group B Strep in sputum culture.  8. Anemia 9. PVCs   10. Shock liver 11. MRSA bacteremia 12. Atrial fibrillation: Paroxysmal.  On amiodarone.     Plan/Discussion:    Septic shock, PCT 33 and WBCs up to 24.  Remains on Vancomycin/Zosyn. starrted back yesterday and she was  re-intubated.  Remains on Norepinephrine 12 mcg + milrinone 0.25 mcg+ vasopressin 0.0-3 units. Marland Kitchen  Co-ox lower today 49%.   Blood cultures - NGTD.    CVP 12, has been getting CVVH.  Not pulling fluid today with low BP.   Rhythm may be atrial flutter, HR in 100s, will get ECG.  Continue  amiodarone gtt . May need cardioversion. Will discuss with Dr Haroldine Laws.   Amy Ninfa Meeker, NP-C  09/04/2016   Advanced Heart Failure Team Pager [phone] (M-F; 7a - 4p)  Please contact [facility] for night-coverage after hours (4p -7a ) and weekends on [email or web address]  Patient seen and examined with Darrick Grinder, NP. We discussed all aspects of the encounter. I agree with the assessment and plan as stated above.   She remains critically ill. Co-ox remains low despite normal LV function and pressor support. Now back on vent and will likely need trach. She is volume overloaded on exam CVP 12. Will need to pull with CVVHD as tolerated. She may have component of sepsis as well and now on broad spectrum abx. Finally, has been in AF/AFL over the weekend - will plan TEE and DC-CV. Patient on heparin for CVVHD will check level.   The patient is critically ill with multiple organ systems failure and requires high complexity decision making for assessment and support, frequent evaluation and titration of therapies, application of advanced monitoring technologies and extensive interpretation of multiple databases.   Critical Care Time devoted to patient care services described in this note is 35  Minutes.  Bensimhon, Daniel,MD 10:06 PM

## 2016-09-04 NOTE — Plan of Care (Signed)
Problem: Activity: Goal: Risk for activity intolerance will decrease Outcome: Not Progressing Pt reintubated on CRRT  Problem: Bowel/Gastric: Goal: Gastrointestinal status for postoperative course will improve Outcome: Progressing BM 09/04/16  Problem: Cardiac: Goal: Hemodynamic stability will improve Outcome: Progressing Titrating down levophed  Problem: Education: Goal: Ability to demonstrate proper wound care will improve Outcome: Not Progressing Pt intubated Goal: Knowledge of disease or condition will improve Outcome: Not Progressing Pt intubated  Problem: Nutritional: Goal: Risk for body nutrition deficit will decrease Outcome: Progressing Tolerating tube feeds  Problem: Urinary Elimination: Goal: Ability to achieve and maintain adequate renal perfusion and functioning will improve Outcome: Not Progressing Remains on CRRT

## 2016-09-04 NOTE — Progress Notes (Signed)
Pharmacy Antibiotic Note  Deborah [name] is a 60 y.o. female continues on vancomycin and zosyn for fever after completing therapy with ceftriaxone for group B strep/MSSA pneumonia. She is also on CRRT.  Antibiotics stopped 9/29, but resumed 9/30 with hypotension and increased WBC 33>24, Tm 99.2 (no interruptions in CRRT)  VT 18 on vancomcyin 750mg  q24  Plan: Vancomycin 750 mg IV Q 24 hours Zosyn 3.375 grams iv Q 6 hours Fluconazole 100mg  q24h  Height: 5\' 1"  (154.9 cm) Weight: 149 lb 0.5 oz (67.6 kg) IBW/kg (Calculated) : 47.8  Temp (24hrs), Avg:99 F (37.2 C), Min:98.7 F (37.1 C), Max:99.2 F (37.3 C)   Recent Labs Lab 08/31/16 0336 08/31/16 0615  09/01/16 0500  09/02/16 0350 09/02/16 0825 09/02/16 1750 09/03/16 0330 09/03/16 1600 09/04/16 0415 09/04/16 0915  WBC 20.5*  --   --  20.2*  --  22.9*  --   --  33.0*  --  24.0*  --   CREATININE 1.30*  1.34*  --   < > 1.47*  < > 1.35* 1.30* 1.27* 1.21* 1.22* 1.33*  --   VANCOTROUGH  --  21*  --   --   --   --   --   --   --   --   --  18  < > = values in this interval not displayed.  Estimated Creatinine Clearance: 40 mL/min (by C-G formula based on SCr of 1.33 mg/dL (H)).    No Known Allergies  Antimicrobials this admission: 9/16 Vancomycin >> 9/18, 9/20>>9/22, resume 9/25>>9/29, resume 9/30> 9/16 Zosyn >> 9/18, resume 9/25>>9/29, resume 9/30>  9/25 Fluconazole x 3 doses resume 10/2> CTX 9/18>> 9/24  Dose adjustments this admission: 9/17 Vanc random = 16, cont 1 g q48h 9/28 Vanc trough (on CRRT) = 21, cont 1g q24 10/2 VT 18 on vancomycin 750mg  qd  Microbiology results: 9/29 BCx:  9/15 BCx: ngf 9/15 Sputum: mod group b strep/MSSA 9/15 MRSA PCR: negative 9/24 BCx2: 1/2 BCID CoNS likely contaminant 9/30 Bcx IP  Bonnita Nasuti Pharm.D. CPP, BCPS Clinical Pharmacist [phone] 09/04/2016 2:30 PM

## 2016-09-04 NOTE — Progress Notes (Signed)
PULMONARY / CRITICAL CARE MEDICINE   Name: Deborah [name] MRN: [id number] DOB: Apr 16, 1956    ADMISSION DATE:  08/18/2016 CONSULTATION DATE:  08/18/2016  REFERRING MD:  Bensimhon   CHIEF COMPLAINT:  Ventilator management and critical care services   HISTORY OF PRESENT ILLNESS:   60 yo female admitted to [facility] 08/15/16 with STEMI s/p cath to [place].  Developed cardiogenic shock, VDRF (ETT 9/14), AKI with metabolic acidosis and started on CRRT 08/17/16.  Transferred to [facility] to tx shock and evaluated new severe MR from papillary muscle rupture.  She also had fever and found to have Group B Strep in sputum culture. Mitral valve replacement 9/20.  SUBJECTIVE:  Reintubated over the weekend. Failed extubation on Friday.  Afebrile on CRRT, remains critically ill On levophed, vasopressin drips and milrinone drips   VITAL SIGNS: BP 117/73   Pulse (!) 110   Temp 99.1 F (37.3 C) (Axillary)   Resp (!) 23   Ht 5\' 1"  (1.549 m)   Wt 67.6 kg (149 lb 0.5 oz)   SpO2 100%   BMI 28.16 kg/m   HEMODYNAMICS: CVP:  [12 mmHg-15 mmHg] 12 mmHg  VENTILATOR SETTINGS: Vent Mode: PRVC FiO2 (%):  [40 %] 40 % Set Rate:  [18 bmp] 18 bmp Vt Set:  [420 mL] 420 mL PEEP:  [5 cmH20] 5 cmH20 Plateau Pressure:  [19 cmH20-23 cmH20] 23 cmH20  INTAKE / OUTPUT: I/O last 3 completed shifts: In: 5061.7 [I.V.:2541.4; NG/GT:1712.3; IV QMVHQIONG:295] Out: 5405 [Other:4880; Stool:525]  PHYSICAL EXAMINATION: General: acutely ill, sedate, moves spont HENT: NCAT  PULM: few rhonchi bilaterally, decreased BS CV: RRR, systolic murmur noted GI: BS+, soft, nontender MSK: normal bulk and tone. Gr 2 edema Neuro: does not Follow commands  LABS:  BMET  Recent Labs Lab 09/03/16 0330 09/03/16 1600 09/03/16 1626 09/04/16 0415  NA 137 135 137 134*  K 3.5 3.8 3.8 4.1  CL 104 102  --  102  CO2 25 23  --  23  BUN 21* 22*  --  27*  CREATININE 1.21* 1.22*  --  1.33*  GLUCOSE 95 133* 134* 211*    Electrolytes  Recent  Labs Lab 09/02/16 0350  09/03/16 0330 09/03/16 1600 09/04/16 0415  CALCIUM 8.2*  < > 8.1* 8.1* 8.0*  MG 2.4  --  2.4  --  2.4  PHOS  --   < > 2.1* 4.1 2.7  < > = values in this interval not displayed.  CBC  Recent Labs Lab 09/02/16 0350  09/03/16 0330 09/03/16 1626 09/04/16 0415  WBC 22.9*  --  33.0*  --  24.0*  HGB 8.5*  < > 9.0* 9.9* 8.3*  HCT 25.5*  < > 27.1* 29.0* 26.0*  PLT 248  --  359  --  325  < > = values in this interval not displayed.  Coag's  Recent Labs Lab 08/29/16 0431  APTT 194*    Sepsis Markers  Recent Labs Lab 09/02/16 1107 09/03/16 0330 09/04/16 0415  PROCALCITON 4.63 16.37 33.02    ABG  Recent Labs Lab 09/02/16 2240 09/03/16 0420 09/04/16 0019  PHART 7.462* 7.491* 7.475*  PCO2ART 32.1 31.0* 31.8*  PO2ART 96.0 106.0 115.0*    Liver Enzymes  Recent Labs Lab 09/02/16 1750 09/03/16 0330 09/03/16 1600 09/04/16 0415  AST 45* 42*  --  55*  ALT 87* 82*  --  78*  ALKPHOS 109 119  --  140*  BILITOT 1.7* 1.4*  --  1.5*  ALBUMIN 2.7*  2.6* 2.6* 2.5*    Cardiac Enzymes No results for input(s): TROPONINI, PROBNP in the last 168 hours.  Glucose  Recent Labs Lab 09/03/16 0831 09/03/16 1221 09/03/16 2014 09/04/16 0003 09/04/16 0420 09/04/16 0823  GLUCAP 125* 199* 107* 167* 204* 125*    Imaging Dg Chest Port 1 View  Result Date: 09/04/2016 CLINICAL DATA:  Hypoxia EXAM: PORTABLE CHEST 1 VIEW COMPARISON:  September 02, 2016 FINDINGS: Endotracheal tube tip is 1.4 cm above the carina. Central catheter tips are in the superior vena cava within dual-lumen catheter tip essentially at the cavoatrial junction. Feeding tube tip below the diaphragm and not seen. Temporary pacemaker wires are attached to the right heart. No pneumothorax. There is atelectatic change in the left base. There is a questionable small right pleural effusion. Lungs elsewhere clear. Heart is upper normal in size with pulmonary vascularity within normal limits.  Patient is status post mitral valve replacement. IMPRESSION: Tube and catheter positions as described without pneumothorax. Small right pleural effusion. Stable left base atelectasis. Stable cardiac silhouette. Electronically Signed   By: Lowella Grip III M.D.   On: 09/04/2016 08:32   Dg Abd Portable 1v  Result Date: 09/03/2016 CLINICAL DATA:  Encounter for feeding tube placement. Hx of DM, HTN EXAM: PORTABLE ABDOMEN - 1 VIEW COMPARISON:  09/02/2016 FINDINGS: Feeding tube with tip in the gastric antrum/ pyloric region. Normal bowel-gas pattern. IMPRESSION: Feeding tube with tip in the gastric pyloric region. Electronically Signed   By: Suzy Bouchard M.D.   On: 09/03/2016 16:24    STUDIES:  LHC 9/12: PCI to OM1, severe MR, EF 55 to 60% RHC 9/15: RA 5, RV 37/3/6, PA 38/18/26, PCWP 15, CI 1.9, PVR 3.3 WU TEE 9/15: EF 60 to 65%, flail motion MV with severe MR RUQ ultrasound 9/23 > Gallbladder wall thickening noted, suggestion of sludge, small R effusion, liver unremarkable but left lobe not well visulalized  MICROBIOLOGY: MRSA PCR 9/12:  Negative MRSA PCR 9/15:  Negative Tracheal Asp Ctx 9/15:  Strep agalactiae  Blood Ctx x2 9/15:  Negative  MRSA PCR 9/19:  Negative  Blood culture 9/24: coag neg staph 2/4  ANTIBIOTICS: Zosyn 9/16 - 9/17 Vancomycin 9/16 - 9/17 Rocephin 9/17 >> 9/24 Vancomycin 9/20 (surgical prophylaxis) Vanc 9/25 > Zosyn 9/25 >  Fluconazole 9/25 >   SIGNIFICANT EVENTS: 9/12 Admit to [facility], PCI to [place] 9/14 ETT, CRRT 9/15 to [facility] 9/16 off insulin gtt 9/20 To OR for Mitral Valve Repair w/ bioprosthetic valve 9/28 To OR for permcath 9/29 Extubated. 9/30 Reintubated  LINES/TUBES: Right IJ PICC 9/12 - 9/20 Rt femoral introducer 9/15 - 9/20 OETT 8.0 9/14 >> 9/29, 9/30 >> HD cath left IJ 9/14 >> R IJ Introducer/CVL/Swan 9/20 >>  9/25 IABP 9/15 >>9/22 Lt femoral a line 9/15 >> 9/25 Y Chest Tube/Mediastinal Chest Tube 9/20 >>out PIV x1 L brachial art line  9/25 > 9/28 R IJ CVL > 9/28 L IJ Permcath >   ASSESSMENT / PLAN:  PULMONARY A: Acute Hypoxic Respiratory Failure - Secondary to acute pulmonary edema, reintubated 9/30 Aspiration risk P:   Defer SBTs while on pressors.  Will need Tracheostomy >  Will discuss with family. Pt is on Plavix > need to ask cards if pt can be off Plavix for 5-7  days at least.    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 Shock - Combination cardiogenic & septic  P:  Telemetry  Post-op Mgt per CVTS/Cardiology Titrate levo gtt,  cont vaso drip ASA, Plavix per cardiology/CVTS  Amio, Milrinone gtt's per cardiology/CVTS  RENAL A:   Acute Renal Failure - Baseline creatinine 1.6-1.7  P:   Nephrology Following Keep even on CVVHD Monitor BMET and UOP Replace electrolytes as needed  GASTROINTESTINAL A:   Transaminitis - peaked 9/23, near complete resolution, unclear etiology Mild Malnutrition - Related to critical illness New onset a fib P:   Pepcid IV q12hr Replaced NGT - cont TF    HEMATOLOGIC A:   Anemia - Last transfusion 9/20 Leukocytosis - Multifactorial  P:  Trending cell counts w/ CBC SCDs  INFECTIOUS A:   Group B Strep Pneumonia Coag negative staph bacteremia (likely contaminant) Line changes on 9/28 Leucocytosis P:   Vanc and zosyn restarted on 9/25 and Fluconazole started on 9/25 > stopped 9/29 but resumed 9/30, high pct noted Re-culture for fever  ENDOCRINE A:   DM - Glucose controlled  P:   Subcutaneous insulin per CVTS Levemir BID  NEUROLOGIC A:   New onset R sided hemiplegia and Preferental gaze to Right (9/25). Cranial CT scan was (-) for acute changes >> resolved without evidence of weakness on 9/29 Acute Encephalopathy - Multifactorial and likely combination toxic metabolic & hypoxia   P:   Follow neuro exam Fent and versed prn pain  Critical care time with this patient today : 30 minutes.   I spoke  with pt's daughter teresa and pt's sister over the phone.  They are undecided with regards to trache.  I mentioned we need to decide soon re: trache as she has to be off plavix at least 5-7 days prior to trache.  They will keep [place] updated re: trache decision. Will ask cards if pt can be off Plavix.   Monica Becton, MD 09/04/2016, 11:10 AM [place] [facility] Pager [phone] After 3 pm or if no answer, call [phone]      09/04/2016, 11:02 AM

## 2016-09-05 ENCOUNTER — Encounter (HOSPITAL_COMMUNITY): Payer: Self-pay | Admitting: Vascular Surgery

## 2016-09-05 ENCOUNTER — Inpatient Hospital Stay (HOSPITAL_COMMUNITY): Payer: Medicaid Other

## 2016-09-05 DIAGNOSIS — J189 Pneumonia, unspecified organism: Secondary | ICD-10-CM

## 2016-09-05 DIAGNOSIS — I5020 Unspecified systolic (congestive) heart failure: Secondary | ICD-10-CM

## 2016-09-05 LAB — POCT I-STAT 3, ART BLOOD GAS (G3+)
ACID-BASE EXCESS: 2 mmol/L (ref 0.0–2.0)
Bicarbonate: 25.7 mmol/L (ref 20.0–28.0)
O2 SAT: 99 %
PCO2 ART: 35.7 mmHg (ref 32.0–48.0)
PO2 ART: 120 mmHg — AB (ref 83.0–108.0)
Patient temperature: 98.5
TCO2: 27 mmol/L (ref 0–100)
pH, Arterial: 7.466 — ABNORMAL HIGH (ref 7.350–7.450)

## 2016-09-05 LAB — GLUCOSE, CAPILLARY
GLUCOSE-CAPILLARY: 143 mg/dL — AB (ref 65–99)
GLUCOSE-CAPILLARY: 163 mg/dL — AB (ref 65–99)
GLUCOSE-CAPILLARY: 174 mg/dL — AB (ref 65–99)
Glucose-Capillary: 161 mg/dL — ABNORMAL HIGH (ref 65–99)
Glucose-Capillary: 163 mg/dL — ABNORMAL HIGH (ref 65–99)
Glucose-Capillary: 221 mg/dL — ABNORMAL HIGH (ref 65–99)

## 2016-09-05 LAB — CBC
HCT: 24.3 % — ABNORMAL LOW (ref 36.0–46.0)
HCT: 27.6 % — ABNORMAL LOW (ref 36.0–46.0)
Hemoglobin: 7.7 g/dL — ABNORMAL LOW (ref 12.0–15.0)
Hemoglobin: 8.8 g/dL — ABNORMAL LOW (ref 12.0–15.0)
MCH: 29.6 pg (ref 26.0–34.0)
MCH: 29.5 pg (ref 26.0–34.0)
MCHC: 31.7 g/dL (ref 30.0–36.0)
MCHC: 31.9 g/dL (ref 30.0–36.0)
MCV: 93.5 fL (ref 78.0–100.0)
MCV: 92.6 fL (ref 78.0–100.0)
Platelets: 306 10*3/uL (ref 150–400)
Platelets: 324 10*3/uL (ref 150–400)
RBC: 2.6 MIL/uL — ABNORMAL LOW (ref 3.87–5.11)
RBC: 2.98 MIL/uL — ABNORMAL LOW (ref 3.87–5.11)
RDW: 19.2 % — ABNORMAL HIGH (ref 11.5–15.5)
RDW: 18.3 % — ABNORMAL HIGH (ref 11.5–15.5)
WBC: 23.5 10*3/uL — ABNORMAL HIGH (ref 4.0–10.5)
WBC: 22.1 10*3/uL — ABNORMAL HIGH (ref 4.0–10.5)

## 2016-09-05 LAB — COMPREHENSIVE METABOLIC PANEL
ALT: 73 U/L — ABNORMAL HIGH (ref 14–54)
AST: 66 U/L — ABNORMAL HIGH (ref 15–41)
Albumin: 3.1 g/dL — ABNORMAL LOW (ref 3.5–5.0)
Alkaline Phosphatase: 190 U/L — ABNORMAL HIGH (ref 38–126)
Anion gap: 8 (ref 5–15)
BUN: 25 mg/dL — ABNORMAL HIGH (ref 6–20)
CO2: 24 mmol/L (ref 22–32)
Calcium: 8.5 mg/dL — ABNORMAL LOW (ref 8.9–10.3)
Chloride: 102 mmol/L (ref 101–111)
Creatinine, Ser: 1.34 mg/dL — ABNORMAL HIGH (ref 0.44–1.00)
GFR calc Af Amer: 49 mL/min — ABNORMAL LOW (ref >60)
GFR calc non Af Amer: 42 mL/min — ABNORMAL LOW (ref >60)
Glucose, Bld: 147 mg/dL — ABNORMAL HIGH (ref 65–99)
Potassium: 3.6 mmol/L (ref 3.5–5.1)
Sodium: 134 mmol/L — ABNORMAL LOW (ref 135–145)
Total Bilirubin: 1.4 mg/dL — ABNORMAL HIGH (ref 0.3–1.2)
Total Protein: 6.6 g/dL (ref 6.5–8.1)

## 2016-09-05 LAB — PREPARE RBC (CROSSMATCH)

## 2016-09-05 LAB — POCT ACTIVATED CLOTTING TIME
ACTIVATED CLOTTING TIME: 186 s
ACTIVATED CLOTTING TIME: 186 s
ACTIVATED CLOTTING TIME: 191 s
ACTIVATED CLOTTING TIME: 197 s
ACTIVATED CLOTTING TIME: 202 s
ACTIVATED CLOTTING TIME: 208 s
Activated Clotting Time: 186 seconds
Activated Clotting Time: 202 seconds
Activated Clotting Time: 213 seconds

## 2016-09-05 LAB — RENAL FUNCTION PANEL
Albumin: 3.1 g/dL — ABNORMAL LOW (ref 3.5–5.0)
Anion gap: 8 (ref 5–15)
BUN: 25 mg/dL — ABNORMAL HIGH (ref 6–20)
CALCIUM: 8.2 mg/dL — AB (ref 8.9–10.3)
CO2: 23 mmol/L (ref 22–32)
CREATININE: 1.27 mg/dL — AB (ref 0.44–1.00)
Chloride: 105 mmol/L (ref 101–111)
GFR, EST AFRICAN AMERICAN: 52 mL/min — AB (ref >60)
GFR, EST NON AFRICAN AMERICAN: 45 mL/min — AB (ref >60)
Glucose, Bld: 142 mg/dL — ABNORMAL HIGH (ref 65–99)
Phosphorus: 3.6 mg/dL (ref 2.5–4.6)
Potassium: 3.6 mmol/L (ref 3.5–5.1)
SODIUM: 136 mmol/L (ref 135–145)

## 2016-09-05 LAB — HEPARIN LEVEL (UNFRACTIONATED): Heparin Unfractionated: 0.8 IU/mL — ABNORMAL HIGH (ref 0.30–0.70)

## 2016-09-05 LAB — CARBOXYHEMOGLOBIN
CARBOXYHEMOGLOBIN: 1.4 % (ref 0.5–1.5)
METHEMOGLOBIN: 0.8 % (ref 0.0–1.5)
O2 SAT: 52.4 %
TOTAL HEMOGLOBIN: 7.9 g/dL — AB (ref 12.0–16.0)

## 2016-09-05 LAB — PHOSPHORUS: PHOSPHORUS: 2.1 mg/dL — AB (ref 2.5–4.6)

## 2016-09-05 LAB — MAGNESIUM: Magnesium: 2.6 mg/dL — ABNORMAL HIGH (ref 1.7–2.4)

## 2016-09-05 MED ORDER — FAMOTIDINE 40 MG/5ML PO SUSR
20.0000 mg | Freq: Two times a day (BID) | ORAL | Status: DC
Start: 2016-09-05 — End: 2016-09-06
  Administered 2016-09-05: 20 mg via ORAL
  Filled 2016-09-05: qty 2.5

## 2016-09-05 MED ORDER — AMIODARONE LOAD VIA INFUSION
150.0000 mg | Freq: Once | INTRAVENOUS | Status: AC
  Administered 2016-09-05: 150 mg via INTRAVENOUS
  Filled 2016-09-05: qty 83.34

## 2016-09-05 MED ORDER — GERHARDT'S BUTT CREAM
TOPICAL_CREAM | Freq: Two times a day (BID) | CUTANEOUS | Status: DC
  Administered 2016-09-05: 12:00:00 via TOPICAL
  Administered 2016-09-05: 1 via TOPICAL
  Administered 2016-09-06 – 2016-09-08 (×5): via TOPICAL
  Administered 2016-09-08: 1 via TOPICAL
  Administered 2016-09-09 (×2): via TOPICAL
  Administered 2016-09-10: 1 via TOPICAL
  Administered 2016-09-10 – 2016-09-17 (×13): via TOPICAL
  Administered 2016-09-18: 1 via TOPICAL
  Administered 2016-09-18 – 2016-09-21 (×6): via TOPICAL
  Administered 2016-09-22 (×2): 1 via TOPICAL
  Administered 2016-09-23 – 2016-09-25 (×5): via TOPICAL
  Administered 2016-09-25: 1 via TOPICAL
  Administered 2016-09-26 – 2016-09-27 (×2): via TOPICAL
  Administered 2016-09-27: 1 via TOPICAL
  Administered 2016-09-28 – 2016-10-02 (×7): via TOPICAL
  Administered 2016-10-02: 1 via TOPICAL
  Administered 2016-10-03 – 2016-10-08 (×10): via TOPICAL
  Administered 2016-10-09 (×2): 1 via TOPICAL
  Administered 2016-10-10 – 2016-10-11 (×2): via TOPICAL
  Filled 2016-09-05 (×7): qty 1

## 2016-09-05 MED ORDER — CLOPIDOGREL BISULFATE 75 MG PO TABS: 75.0000 mg | ORAL_TABLET | Freq: Every day | ORAL | Status: DC

## 2016-09-05 MED ORDER — DEXTROSE 5 % IV SOLN
40.0000 meq | Freq: Once | INTRAVENOUS | Status: AC
  Administered 2016-09-05: 40 meq via INTRAVENOUS
  Filled 2016-09-05: qty 10

## 2016-09-05 MED ORDER — MIDODRINE HCL 5 MG PO TABS
5.0000 mg | ORAL_TABLET | Freq: Three times a day (TID) | ORAL | Status: DC
  Administered 2016-09-05 – 2016-09-09 (×12): 5 mg via ORAL
  Filled 2016-09-05 (×11): qty 1

## 2016-09-05 MED ORDER — ALBUMIN HUMAN 25 % IV SOLN
12.5000 g | Freq: Four times a day (QID) | INTRAVENOUS | Status: AC
Start: 2016-09-05 — End: 2016-09-06
  Administered 2016-09-05 – 2016-09-06 (×5): 12.5 g via INTRAVENOUS
  Filled 2016-09-05 (×3): qty 50
  Filled 2016-09-05: qty 100

## 2016-09-05 NOTE — Plan of Care (Signed)
Problem: Activity: Goal: Risk for activity intolerance will decrease Outcome: Not Progressing Pt remains intubated and on CRRT.  Problem: Bowel/Gastric: Goal: Gastrointestinal status for postoperative course will improve Outcome: Progressing BM 09/05/2016  Problem: Cardiac: Goal: Hemodynamic stability will improve Outcome: Progressing Weaning vasoactive gtts. Back in Afib  Problem: Nutritional: Goal: Risk for body nutrition deficit will decrease Outcome: Progressing Tolerating tube feeds  Problem: Respiratory: Goal: Ability to tolerate decreased levels of ventilator support will improve Outcome: Progressing Pt for Tstomy 10/4 or 10/5  Problem: Urinary Elimination: Goal: Ability to achieve and maintain adequate renal perfusion and functioning will improve Outcome: Not Progressing Pt remains on CRRT

## 2016-09-05 NOTE — Progress Notes (Signed)
   [facility] PCCM  Case discussed with Dr. Nelda Marseille.  He will round on the pt starting tomorrow.  Plan to hold off on am's dose of Plavix (10/4). He will assess whether trache is on 10/4 or 10/5.   Monica Becton, MD 09/05/2016, 2:04 PM [place] [facility] Pager [phone] After 3 pm or if no answer, call [phone]

## 2016-09-05 NOTE — Progress Notes (Signed)
TCTS BRIEF SICU PROGRESS NOTE  5 Days Post-Op  S/P Procedure(s) (LRB): INSERTION OF DIALYSIS CATHETER LEFT INTERNAL JUGULAR VEIN & INSERTION OF TRIPLE LUMEN RIGHT INTERNAL JUGULAR VEIN (Bilateral)   Stable day  Plan: Continue current plan  Rexene Alberts, MD 09/05/2016 6:31 PM

## 2016-09-05 NOTE — Plan of Care (Signed)
Problem: Activity: Goal: Risk for activity intolerance will decrease Outcome: Not Progressing Pt remains intubated and sedated. On CRRT  Problem: Bowel/Gastric: Goal: Gastrointestinal status for postoperative course will improve Outcome: Progressing BM 09/05/2016  Problem: Cardiac: Goal: Hemodynamic stability will improve Outcome: Progressing Weaning vasoactive drips.  Problem: Education: Goal: Ability to demonstrate proper wound care will improve Outcome: Not Progressing Pt remains intubated and sedated  Problem: Nutritional: Goal: Risk for body nutrition deficit will decrease Outcome: Progressing Tolerating tube feeds  Problem: Respiratory: Goal: Ability to tolerate decreased levels of ventilator support will improve Outcome: Progressing Plan is possibly to Trach this week.  Problem: Urinary Elimination: Goal: Ability to achieve and maintain adequate renal perfusion and functioning will improve Outcome: Not Progressing Remains on CRRT

## 2016-09-05 NOTE — Progress Notes (Signed)
5 Days Post-Op Procedure(s) (LRB): INSERTION OF DIALYSIS CATHETER LEFT INTERNAL JUGULAR VEIN & INSERTION OF TRIPLE LUMEN RIGHT INTERNAL JUGULAR VEIN (Bilateral) Subjective: Patient stable overnight maintaining sinus rhythm after cardioversion Hemoglobin now 7.5 with carboxyhemoglobin 52%-will transfuse one unit Patient will benefit from tracheostomy and appreciate CCM input for percutaneous placement. We'll hold Plavix tomorrow for possible trach placement. Patient more alert and responsive. Surgical incisions clean and dry  Objective: Vital signs in last 24 hours: Temp:  [97.7 F (36.5 C)-98.7 F (37.1 C)] 97.9 F (36.6 C) (10/03 1545) Pulse Rate:  [63-106] 99 (10/03 1830) Cardiac Rhythm: Atrial fibrillation (10/03 1800) Resp:  [15-37] 19 (10/03 1830) BP: (96-149)/(60-90) 113/72 (10/03 1830) SpO2:  [100 %] 100 % (10/03 1830) Arterial Line BP: (91-138)/(49-78) 113/67 (10/03 1830) FiO2 (%):  [40 %] 40 % (10/03 1800) Weight:  [147 lb 7.8 oz (66.9 kg)] 147 lb 7.8 oz (66.9 kg) (10/03 0500)  Hemodynamic parameters for last 24 hours: CVP:  [10 mmHg-16 mmHg] 14 mmHg  Intake/Output from previous day: 10/02 0701 - 10/03 0700 In: 3391.5 [I.V.:1487.2; NG/GT:894.3; IV Piggyback:1010] Out: 3373 [Stool:300] Intake/Output this shift: Total I/O In: 1869.2 [I.V.:529.2; Blood:275; NG/GT:565; IV Piggyback:500] Out: 1999 [LVDIX:1855]       Exam    General- on ventilator but opens eyes and tracks   Lungs- clear without rales, wheezes   Cor- regular rate and rhythm, no murmur , gallop   Abdomen- soft, non-tender   Extremities - warm, non-tender, minimal edema   Neuro- oriented, appropriate, no focal weakness   Lab Results:  Recent Labs  09/05/16 0405 09/05/16 1507  WBC 23.5* 22.1*  HGB 7.7* 8.8*  HCT 24.3* 27.6*  PLT 306 324   BMET:  Recent Labs  09/05/16 0405 09/05/16 1507  NA 134* 136  K 3.6 3.6  CL 102 105  CO2 24 23  GLUCOSE 147* 142*  BUN 25* 25*  CREATININE 1.34*  1.27*  CALCIUM 8.5* 8.2*    PT/INR:  Recent Labs  09/04/16 1130  LABPROT 22.2*  INR 1.92   ABG    Component Value Date/Time   PHART 7.466 (H) 09/05/2016 0406   HCO3 25.7 09/05/2016 0406   TCO2 27 09/05/2016 0406   ACIDBASEDEF 4.1 (H) 09/02/2016 1850   O2SAT 52.4 09/05/2016 0427   CBG (last 3)   Recent Labs  09/05/16 0417 09/05/16 0758 09/05/16 1159  GLUCAP 143* 163* 221*    Assessment/Plan: S/P Procedure(s) (LRB): INSERTION OF DIALYSIS CATHETER LEFT INTERNAL JUGULAR VEIN & INSERTION OF TRIPLE LUMEN RIGHT INTERNAL JUGULAR VEIN (Bilateral) Continue broad-spectrum antibiotics for elevated white count possible HCAP Continue CVVH Wean pressors now the patient is stabilized Tracheostomy per CCM-indication is acute respiratory failure with requirement for reintubation  LOS: 18 days    Deborah [name] 09/05/2016

## 2016-09-05 NOTE — Progress Notes (Signed)
PULMONARY / CRITICAL CARE MEDICINE   Name: Deborah [name] MRN: [id number] DOB: 1956-04-28    ADMISSION DATE:  08/18/2016 CONSULTATION DATE:  08/18/2016  REFERRING MD:  Bensimhon   CHIEF COMPLAINT:  Ventilator management and critical care services   HISTORY OF PRESENT ILLNESS:   60 yo female admitted to [facility] 08/15/16 with STEMI s/p cath to [place].  Developed cardiogenic shock, VDRF (ETT 9/14), AKI with metabolic acidosis and started on CRRT 08/17/16.  Transferred to [name] to tx shock and evaluated new severe MR from papillary muscle rupture.  She also had fever and found to have Group B Strep in sputum culture. Mitral valve replacement 9/20.  SUBJECTIVE:  Reintubated over the weekend. Failed extubation on Friday.  Afebrile on CRRT, remains critically ill On levophed, vasopressin drips and milrinone drips Had DC cardioversion on 10/2   VITAL SIGNS: BP 132/83   Pulse 100   Temp 97.7 F (36.5 C) (Axillary)   Resp 19   Ht 5\' 1"  (1.549 m)   Wt 66.9 kg (147 lb 7.8 oz)   SpO2 100%   BMI 27.87 kg/m   HEMODYNAMICS: CVP:  [13 mmHg-17 mmHg] 13 mmHg  VENTILATOR SETTINGS: Vent Mode: PRVC FiO2 (%):  [40 %] 40 % Set Rate:  [18 bmp] 18 bmp Vt Set:  [420 mL] 420 mL PEEP:  [5 cmH20] 5 cmH20 Pressure Support:  [5 cmH20] 5 cmH20 Plateau Pressure:  [15 cmH20-20 cmH20] 17 cmH20  INTAKE / OUTPUT: I/O last 3 completed shifts: In: 4824.8 [I.V.:2290.5; NG/GT:1374.3; IV Piggyback:1160] Out: 8676 [PPJKD:3267; Stool:825]  PHYSICAL EXAMINATION: General: acutely ill, sedate, moves spont HENT: NCAT  PULM: few rhonchi bilaterally, decreased BS CV: RRR, systolic murmur noted GI: BS+, soft, nontender MSK: normal bulk and tone. Gr 2 edema Neuro: does not Follow commands  LABS:  BMET  Recent Labs Lab 09/04/16 0415 09/04/16 1600 09/05/16 0405  NA 134* 135 134*  K 4.1 3.9 3.6  CL 102 102 102  CO2 23 23 24   BUN 27* 22* 25*  CREATININE 1.33* 1.31* 1.34*  GLUCOSE 211* 95 147*     Electrolytes  Recent Labs Lab 09/03/16 0330  09/04/16 0415 09/04/16 1600 09/05/16 0405  CALCIUM 8.1*  < > 8.0* 8.5* 8.5*  MG 2.4  --  2.4  --  2.6*  PHOS 2.1*  < > 2.7 2.5 2.1*  < > = values in this interval not displayed.  CBC  Recent Labs Lab 09/03/16 0330 09/03/16 1626 09/04/16 0415 09/05/16 0405  WBC 33.0*  --  24.0* 23.5*  HGB 9.0* 9.9* 8.3* 7.7*  HCT 27.1* 29.0* 26.0* 24.3*  PLT 359  --  325 306    Coag's  Recent Labs Lab 09/04/16 1130  INR 1.92    Sepsis Markers  Recent Labs Lab 09/02/16 1107 09/03/16 0330 09/04/16 0415  PROCALCITON 4.63 16.37 33.02    ABG  Recent Labs Lab 09/03/16 0420 09/04/16 0019 09/05/16 0406  PHART 7.491* 7.475* 7.466*  PCO2ART 31.0* 31.8* 35.7  PO2ART 106.0 115.0* 120.0*    Liver Enzymes  Recent Labs Lab 09/03/16 0330  09/04/16 0415 09/04/16 1600 09/05/16 0405  AST 42*  --  55*  --  66*  ALT 82*  --  78*  --  73*  ALKPHOS 119  --  140*  --  190*  BILITOT 1.4*  --  1.5*  --  1.4*  ALBUMIN 2.6*  < > 2.5* 2.8* 3.1*  < > = values in this interval not displayed.  Cardiac Enzymes No results for input(s): TROPONINI, PROBNP in the last 168 hours.  Glucose  Recent Labs Lab 09/04/16 1157 09/04/16 1547 09/04/16 2051 09/05/16 0023 09/05/16 0417 09/05/16 0758  GLUCAP 139* 81 208* 174* 143* 163*    Imaging Dg Chest Port 1 View  Result Date: 09/05/2016 CLINICAL DATA:  ett present,,MVR EXAM: PORTABLE CHEST 1 VIEW COMPARISON:  09/04/2016 FINDINGS: Endotracheal tube remains 1 cm from carina. Enteric tube and central venous lines unchanged. Stable cardiac silhouette. Small bilateral pleural effusions. No pneumothorax. IMPRESSION: 1. No interval change. 2. Stable support apparatus. 3. Small effusions. Electronically Signed   By: Suzy Bouchard M.D.   On: 09/05/2016 07:44    STUDIES:  LHC 9/12: PCI to OM1, severe MR, EF 55 to 60% RHC 9/15: RA 5, RV 37/3/6, PA 38/18/26, PCWP 15, CI 1.9, PVR 3.3 WU TEE  9/15: EF 60 to 65%, flail motion MV with severe MR RUQ ultrasound 9/23 > Gallbladder wall thickening noted, suggestion of sludge, small R effusion, liver unremarkable but left lobe not well visulalized  MICROBIOLOGY: MRSA PCR 9/12:  Negative MRSA PCR 9/15:  Negative Tracheal Asp Ctx 9/15:  Strep agalactiae  Blood Ctx x2 9/15:  Negative  MRSA PCR 9/19:  Negative  Blood culture 9/24: coag neg staph 2/4  ANTIBIOTICS: Zosyn 9/16 - 9/17 Vancomycin 9/16 - 9/17 Rocephin 9/17 >> 9/24 Vancomycin 9/20 (surgical prophylaxis) Vanc 9/25 > Zosyn 9/25 >  Fluconazole 9/25 >   SIGNIFICANT EVENTS: 9/12 Admit to [facility], PCI to [place] 9/14 ETT, CRRT 9/15 to [facility] 9/16 off insulin gtt 9/20 To OR for Mitral Valve Repair w/ bioprosthetic valve 9/28 To OR for permcath 9/29 Extubated. 9/30 Reintubated  LINES/TUBES: Right IJ PICC 9/12 - 9/20 Rt femoral introducer 9/15 - 9/20 OETT 8.0 9/14 >> 9/29, 9/30 >> HD cath left IJ 9/14 >> R IJ Introducer/CVL/Swan 9/20 >>  9/25 IABP 9/15 >>9/22 Lt femoral a line 9/15 >> 9/25 Y Chest Tube/Mediastinal Chest Tube 9/20 >>out PIV x1 L brachial art line 9/25 > 9/28 R IJ CVL > 9/28 L IJ Permcath >   ASSESSMENT / PLAN:  PULMONARY A: Acute Hypoxic Respiratory Failure - Secondary to acute pulmonary edema, reintubated 9/30 Aspiration risk P:   Defer SBTs while on pressors.  Plan for tracheostomy on 10/4 >> will need to discuss this with Dr. Nelda Marseille this pm as he is rounding on this pt tomorrow. Per Cards, pt can NOT be off Plavix.    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 Shock - Combination cardiogenic & septic Afib, S/P DC cardioversion on 10/2, currently in sinus  P:  Telemetry  Post-op Mgt per CVTS/Cardiology Titrate levo gtt, cont vaso drip ASA, Plavix per cardiology/CVTS  Amio, Milrinone gtt's per cardiology/CVTS  RENAL A:   Acute Renal Failure - Baseline creatinine 1.6-1.7  P:   Nephrology  Following Keep even on CVVHD Monitor BMET and UOP Replace electrolytes as needed  GASTROINTESTINAL A:   Transaminitis - peaked 9/23, near complete resolution, unclear etiology Mild Malnutrition - Related to critical illness  P:   Pepcid IV q12hr Replaced NGT - cont TF    HEMATOLOGIC A:   Anemia - Last transfusion 9/20 Leukocytosis - Multifactorial  P:  Trending cell counts w/ CBC SCDs  INFECTIOUS A:   Group B Strep Pneumonia Coag negative staph bacteremia (likely contaminant) Line changes on 9/28 Leucocytosis P:   Vanc and zosyn and fluconazole restarted on 9/25  > stopped 9/29 but  resumed 9/30, high pct noted. Abx per primary team.  Re-culture for fever  ENDOCRINE A:   DM - Glucose controlled  P:   Subcutaneous insulin per CVTS Levemir BID  NEUROLOGIC A:   New onset R sided hemiplegia and Preferental gaze to Right (9/25). Cranial CT scan was (-) for acute changes >> resolved without evidence of weakness on 9/29 Acute Encephalopathy - Multifactorial and likely combination toxic metabolic & hypoxia   P:   Follow neuro exam Fent and versed prn pain  Critical care time with this patient today : 30 minutes.  No family at bedside.    Monica Becton, MD 09/05/2016, 11:25 AM [place] [facility] Pager [phone] After 3 pm or if no answer, call [phone]  09/05/2016, 11:25 AM

## 2016-09-05 NOTE — Progress Notes (Signed)
Patient ID: Deborah [name], female   DOB: [birth year], 60 y.o.   MRN: [id number]   60 y/o woman with DM2, HTN, HL and noncompliance presented to [facility] ER with infrerior posterior STEMI on 9/12. Taken to cath lab and found to have occluded small to moderate OM-1 branch. Underwent successful PCS with stent x 2 by Dr. Clayborn Bigness. Coronaries otherwise ok. EF 55-60% with severe MR confirmed by echo.   Post-cath developed respiratory failure and required intubation. Developed shock and renal failure with peak creatinine 3.4 (1.6 on admit). Trialysis catheter placed.   Patient persistently acidotic with concern for ischemic MR and transferred here. En route develop severe hypotension and norepinephrine started.   Brought to cath lab 9/15 and TEE showed normal LV (EF 60-65%) and normal RV function with ruptured posterior papillary muscle with severe posterior MR.  Underwent placement of Swan and IABP.   Treated for group B Strep PNA.   S/P MVR 08/23/16.  IABP pulled 9/22.   Echo 9/23  RV and LV are normal (EF 60%) but appeared underfilled   Extubated 9/29.  However, picture consistent with septic shock on 9/30 so pressors started and she was re-intubated.  S/P TEE DC-CV 09/04/16    Remains on milrinone 0.25 + norepi 9 mcg + vasopressin 0.02 units. Blood cultures no growth today. Todays CO-OX 52%.  CVVH but not pulling fluid.    Scheduled Meds: . albumin human  12.5 g Intravenous Q6H  . atorvastatin  20 mg Oral q1800  . chlorhexidine gluconate (MEDLINE KIT)  15 mL Mouth Rinse BID  . clopidogrel  75 mg Oral Daily  . darbepoetin (ARANESP) injection - NON-DIALYSIS  100 mcg Subcutaneous Q Mon-1800  . famotidine (PEPCID) IV  20 mg Intravenous Q12H  . feeding supplement (NEPRO CARB STEADY)  1,000 mL Oral Q24H  . feeding supplement (PRO-STAT SUGAR FREE 64)  30 mL Per Tube TID  . fluconazole (DIFLUCAN) IV  100 mg Intravenous Q24H  . Gerhardt's butt cream   Topical BID  . insulin aspart  0-24 Units  Subcutaneous Q4H  . insulin detemir  10 Units Subcutaneous BID  . mouth rinse  15 mL Mouth Rinse 10 times per day  . piperacillin-tazobactam  3.375 g Intravenous Q6H  . sodium chloride flush  3 mL Intravenous Q12H  . sodium chloride flush  3 mL Intravenous Q12H  . sodium phosphate  Dextrose 5% IVPB  40 mEq Intravenous Once  . vancomycin  750 mg Intravenous Q24H   Continuous Infusions: . sodium chloride Stopped (08/27/16 0900)  . sodium chloride    . sodium chloride 20 mL/hr at 09/05/16 0900  . sodium chloride    . sodium chloride    . amiodarone 30 mg/hr (09/05/16 0900)  . heparin 10,000 units/ 20 mL infusion syringe 1,450 Units/hr (09/05/16 0900)  . lidocaine Stopped (08/29/16 0900)  . milrinone 0.252 mcg/kg/min (09/05/16 0900)  . norepinephrine (LEVOPHED) Adult infusion 9 mcg/min (09/05/16 0900)  . dialysis replacement fluid (prismasate) 600 mL/hr at 09/05/16 0750  . dialysis replacement fluid (prismasate) 200 mL/hr at 09/04/16 1855  . dialysate (PRISMASATE) 1,000 mL/hr at 09/05/16 0428  . vasopressin (PITRESSIN) infusion - *FOR SHOCK* 0.02 Units/min (09/05/16 0900)   PRN Meds:.sodium chloride, Place/Maintain arterial line **AND** sodium chloride, fentaNYL (SUBLIMAZE) injection, heparin, HYDROcodone-acetaminophen, midazolam, ondansetron (ZOFRAN) IV, sodium chloride, sodium chloride flush, sodium chloride flush    Vitals:   09/05/16 0815 09/05/16 0830 09/05/16 0845 09/05/16 0900  BP:  123/64  126/77  Pulse: Marland Kitchen)  106 100 100 100  Resp: 20 (!) 23 20 (!) 22  Temp:      TempSrc:      SpO2: 100% 100% 100% 100%  Weight:      Height:        Intake/Output Summary (Last 24 hours) at 09/05/16 0915 Last data filed at 09/05/16 0900  Gross per 24 hour  Intake          3079.18 ml  Output             3312 ml  Net          -232.82 ml    LABS: Basic Metabolic Panel:  Recent Labs  09/04/16 0415 09/04/16 1600 09/05/16 0405  NA 134* 135 134*  K 4.1 3.9 3.6  CL 102 102 102  CO2  23 23 24   GLUCOSE 211* 95 147*  BUN 27* 22* 25*  CREATININE 1.33* 1.31* 1.34*  CALCIUM 8.0* 8.5* 8.5*  MG 2.4  --  2.6*  PHOS 2.7 2.5 2.1*   Liver Function Tests:  Recent Labs  09/04/16 0415 09/04/16 1600 09/05/16 0405  AST 55*  --  66*  ALT 78*  --  73*  ALKPHOS 140*  --  190*  BILITOT 1.5*  --  1.4*  PROT 6.2*  --  6.6  ALBUMIN 2.5* 2.8* 3.1*   No results for input(s): LIPASE, AMYLASE in the last 72 hours. CBC:  Recent Labs  09/04/16 0415 09/05/16 0405  WBC 24.0* 23.5*  HGB 8.3* 7.7*  HCT 26.0* 24.3*  MCV 91.5 93.5  PLT 325 306   Cardiac Enzymes: No results for input(s): CKTOTAL, CKMB, CKMBINDEX, TROPONINI in the last 72 hours. BNP: Invalid input(s): POCBNP D-Dimer: No results for input(s): DDIMER in the last 72 hours. Hemoglobin A1C: No results for input(s): HGBA1C in the last 72 hours. Fasting Lipid Panel: No results for input(s): CHOL, HDL, LDLCALC, TRIG, CHOLHDL, LDLDIRECT in the last 72 hours. Thyroid Function Tests: No results for input(s): TSH, T4TOTAL, T3FREE, THYROIDAB in the last 72 hours.  Invalid input(s): FREET3 Anemia Panel: No results for input(s): VITAMINB12, FOLATE, FERRITIN, TIBC, IRON, RETICCTPCT in the last 72 hours.  PHYSICAL EXAM CVP 12-13 General: Intubated/awake Neck: RIJ TLC LIJ trialysis  Lungs: Clear anteriorly CV: Nondisplaced PMI.  Heart tachy, regular S1/S2, no S3/S4, 1/6 SEM USB.   Abdomen: Soft, ND, no HSM. No bruits or masses. +BS  Neurologic: Awake.   Extremities: No clubbing or cyanosis. R and LLE SCDs. 2+ edema   TELEMETRY: NSR 90s .   Assessment:   1. Cardiogenic shock: Due to acute severe MR. EF 70% on 9/25 echo.  2. CAD: Inferior posterior STEMI on 9/13 with DES x 2 to OM-1      --Plavix restarted on 9/22 3. 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     --s/p MVR 08/23/2016  4. AKI on CKD stage III - due to ATN likely from  cardiogenic shock + contrast with cath. Has required CVVH.  5. Acute respiratory failure: Pulmonary edema.  6. DM2 7. Suspect component of septic shock: Possible PNA, group B Strep in sputum culture.  8. Anemia 9. PVCs   10. Shock liver 11. MRSA bacteremia 12. Atrial fibrillation: Paroxysmal.  On amiodarone.     Plan/Discussion:   Remains intubated.   Septic shock,  WBCs  23.5.  Remains on Vancomycin/Zosyn. Diflucan added yesterday.    Remains on Norepinephrine 12 mcg + milrinone  0.25 mcg+ vasopressin 0.03 units. Marland Kitchen Co-ox lower today 52%.   Blood cultures - NGTD.  No bb with low output.    has been getting CVVH.  Not pulling fluid today with low BP.   S/P TEE DC-CV on 10/3 . Continue  amiodarone gtt . Maintaining NSR.   Hemoglobin down to 7.7. Receiving blood today.   Amy Ninfa Meeker, NP-C  09/05/2016   Advanced Heart Failure Team Pager [phone] (M-F; 7a - 4p)  Please contact [facility] for night-coverage after hours (4p -7a ) and weekends on [email or web address]  Patient seen and examined with Darrick Grinder, NP. We discussed all aspects of the encounter. I agree with the assessment and plan as stated above.   She is awake on vent. Weaning pressors but still pressor dependent. Now back in NSR after DC-CV next week. CVP 12. Co-ox low at 52% but ahs gotten blood today so that should help. Weight down but appears volume overloaded on exam.   Will start midodrine to help wean pressors. Repeat co-ox. Plan trach tomorrow (d/w CCM). Would like to resume volume removal soon.   Continue broad spectrum abx/antifungals.   The patient is critically ill with multiple organ systems failure and requires high complexity decision making for assessment and support, frequent evaluation and titration of therapies, application of advanced monitoring technologies and extensive interpretation of multiple databases.   Critical Care Time devoted to patient care services described in this note is 35  Minutes.  Dashana Guizar,MD 2:32 PM

## 2016-09-05 NOTE — Progress Notes (Signed)
Subjective:  Had cardioversion- Remains critically ill overnight-pressor doses stable  CRRT running well  Objective Vital signs in last 24 hours: Vitals:   09/05/16 0800 09/05/16 0815 09/05/16 0830 09/05/16 0845  BP: 116/62  123/64   Pulse: 82 (!) 106 100 100  Resp: 20 20 (!) 23 20  Temp:      TempSrc:      SpO2: 100% 100% 100% 100%  Weight:      Height:       Weight change: -0.7 kg (-1 lb 8.7 oz)  Intake/Output Summary (Last 24 hours) at 09/05/16 0900 Last data filed at 09/05/16 6295  Gross per 24 hour  Intake          3012.31 ml  Output             3117 ml  Net          -104.69 ml    Assessment/ Plan: Pt is a 60 y.o. yo female who was admitted on 08/18/2016 with STEMI- then severe MR- s/p stenting and eventually MVR - clinical decline of late with reintubation- possible PNA Assessment/Plan: 1. Renal- A on CRF- essentially on CRRT since 9/14- most recent dialysis catheter in since 9/28- still unstable requiring pressors.  Given this prolonged AKI - HD dep for over 2 weeks would think renal recovery is becoming less likely as time passes  2. HTN/vol-  Running even with CRRT- peripheral edema with low alb- CVP in the low teens -CTS giving albumin - OK to give separate from CRRT  3. Anemia- this AM in the 8's- added ESA- transfuse as needed per primary  4. Secondary hyperparathyroidism- s/p phos repletion- needs again-  no binders- K is stable  5. CV- per CTS- s/p MVR - atrial arrythmias- on pressors- s/p cardioversion yest 6. ID- high WBC on vanc /zosyn   Deborah [name] A    Labs: Basic Metabolic Panel:  Recent Labs Lab 09/04/16 0415 09/04/16 1600 09/05/16 0405  NA 134* 135 134*  K 4.1 3.9 3.6  CL 102 102 102  CO2 23 23 24   GLUCOSE 211* 95 147*  BUN 27* 22* 25*  CREATININE 1.33* 1.31* 1.34*  CALCIUM 8.0* 8.5* 8.5*  PHOS 2.7 2.5 2.1*   Liver Function Tests:  Recent Labs Lab 09/03/16 0330  09/04/16 0415 09/04/16 1600 09/05/16 0405  AST 42*  --  55*  --   66*  ALT 82*  --  78*  --  73*  ALKPHOS 119  --  140*  --  190*  BILITOT 1.4*  --  1.5*  --  1.4*  PROT 6.7  --  6.2*  --  6.6  ALBUMIN 2.6*  < > 2.5* 2.8* 3.1*  < > = values in this interval not displayed. No results for input(s): LIPASE, AMYLASE in the last 168 hours.  Recent Labs Lab 09/02/16 1752  AMMONIA 23   CBC:  Recent Labs Lab 09/01/16 0500  09/02/16 0350  09/03/16 0330 09/03/16 1626 09/04/16 0415 09/05/16 0405  WBC 20.2*  --  22.9*  --  33.0*  --  24.0* 23.5*  HGB 8.1*  < > 8.5*  < > 9.0* 9.9* 8.3* 7.7*  HCT 25.0*  < > 25.5*  < > 27.1* 29.0* 26.0* 24.3*  MCV 89.6  --  89.2  --  89.7  --  91.5 93.5  PLT 189  --  248  --  359  --  325 306  < > = values in this interval  not displayed. Cardiac Enzymes: No results for input(s): CKTOTAL, CKMB, CKMBINDEX, TROPONINI in the last 168 hours. CBG:  Recent Labs Lab 09/04/16 1547 09/04/16 2051 09/05/16 0023 09/05/16 0417 09/05/16 0758  GLUCAP 81 208* 174* 143* 163*    Iron Studies: No results for input(s): IRON, TIBC, TRANSFERRIN, FERRITIN in the last 72 hours. Studies/Results: Dg Chest Port 1 View  Result Date: 09/05/2016 CLINICAL DATA:  ett present,,MVR EXAM: PORTABLE CHEST 1 VIEW COMPARISON:  09/04/2016 FINDINGS: Endotracheal tube remains 1 cm from carina. Enteric tube and central venous lines unchanged. Stable cardiac silhouette. Small bilateral pleural effusions. No pneumothorax. IMPRESSION: 1. No interval change. 2. Stable support apparatus. 3. Small effusions. Electronically Signed   By: Suzy Bouchard M.D.   On: 09/05/2016 07:44   Dg Chest Port 1 View  Result Date: 09/04/2016 CLINICAL DATA:  Hypoxia EXAM: PORTABLE CHEST 1 VIEW COMPARISON:  September 02, 2016 FINDINGS: Endotracheal tube tip is 1.4 cm above the carina. Central catheter tips are in the superior vena cava within dual-lumen catheter tip essentially at the cavoatrial junction. Feeding tube tip below the diaphragm and not seen. Temporary pacemaker  wires are attached to the right heart. No pneumothorax. There is atelectatic change in the left base. There is a questionable small right pleural effusion. Lungs elsewhere clear. Heart is upper normal in size with pulmonary vascularity within normal limits. Patient is status post mitral valve replacement. IMPRESSION: Tube and catheter positions as described without pneumothorax. Small right pleural effusion. Stable left base atelectasis. Stable cardiac silhouette. Electronically Signed   By: Lowella Grip III M.D.   On: 09/04/2016 08:32   Dg Abd Portable 1v  Result Date: 09/03/2016 CLINICAL DATA:  Encounter for feeding tube placement. Hx of DM, HTN EXAM: PORTABLE ABDOMEN - 1 VIEW COMPARISON:  09/02/2016 FINDINGS: Feeding tube with tip in the gastric antrum/ pyloric region. Normal bowel-gas pattern. IMPRESSION: Feeding tube with tip in the gastric pyloric region. Electronically Signed   By: Suzy Bouchard M.D.   On: 09/03/2016 16:24   Medications: Infusions: . sodium chloride Stopped (08/27/16 0900)  . sodium chloride    . sodium chloride 20 mL/hr at 09/05/16 0800  . sodium chloride    . sodium chloride    . amiodarone 30 mg/hr (09/05/16 0800)  . heparin 10,000 units/ 20 mL infusion syringe 1,450 Units/hr (09/05/16 0800)  . lidocaine Stopped (08/29/16 0900)  . milrinone 0.252 mcg/kg/min (09/05/16 0800)  . norepinephrine (LEVOPHED) Adult infusion 10 mcg/min (09/05/16 0800)  . dialysis replacement fluid (prismasate) 600 mL/hr at 09/05/16 0750  . dialysis replacement fluid (prismasate) 200 mL/hr at 09/04/16 1855  . dialysate (PRISMASATE) 1,000 mL/hr at 09/05/16 0428  . vasopressin (PITRESSIN) infusion - *FOR SHOCK* 0.02 Units/min (09/05/16 3976)    Scheduled Medications: . albumin human  12.5 g Intravenous Q6H  . atorvastatin  20 mg Oral q1800  . chlorhexidine gluconate (MEDLINE KIT)  15 mL Mouth Rinse BID  . clopidogrel  75 mg Oral Daily  . darbepoetin (ARANESP) injection -  NON-DIALYSIS  100 mcg Subcutaneous Q Mon-1800  . famotidine (PEPCID) IV  20 mg Intravenous Q12H  . feeding supplement (NEPRO CARB STEADY)  1,000 mL Oral Q24H  . feeding supplement (PRO-STAT SUGAR FREE 64)  30 mL Per Tube TID  . fluconazole (DIFLUCAN) IV  100 mg Intravenous Q24H  . Gerhardt's butt cream   Topical BID  . insulin aspart  0-24 Units Subcutaneous Q4H  . insulin detemir  10 Units Subcutaneous BID  . mouth  rinse  15 mL Mouth Rinse 10 times per day  . piperacillin-tazobactam  3.375 g Intravenous Q6H  . sodium chloride flush  3 mL Intravenous Q12H  . sodium chloride flush  3 mL Intravenous Q12H  . vancomycin  750 mg Intravenous Q24H    have reviewed scheduled and prn medications.  Physical Exam: General: sedated- but arousable Heart: tachy Lungs: CBS bilat Abdomen: soft, non tender Extremities: pitting edema Dialysis Access: left IJ vascath placed 9/28    09/05/2016,9:00 AM  LOS: 18 days

## 2016-09-06 ENCOUNTER — Inpatient Hospital Stay (HOSPITAL_COMMUNITY): Payer: Medicaid Other

## 2016-09-06 ENCOUNTER — Encounter (HOSPITAL_COMMUNITY): Payer: Self-pay | Admitting: Vascular Surgery

## 2016-09-06 DIAGNOSIS — Z992 Dependence on renal dialysis: Secondary | ICD-10-CM

## 2016-09-06 DIAGNOSIS — N186 End stage renal disease: Secondary | ICD-10-CM

## 2016-09-06 LAB — GLUCOSE, CAPILLARY
GLUCOSE-CAPILLARY: 160 mg/dL — AB (ref 65–99)
Glucose-Capillary: 140 mg/dL — ABNORMAL HIGH (ref 65–99)
Glucose-Capillary: 146 mg/dL — ABNORMAL HIGH (ref 65–99)
Glucose-Capillary: 162 mg/dL — ABNORMAL HIGH (ref 65–99)
Glucose-Capillary: 145 mg/dL — ABNORMAL HIGH (ref 65–99)
Glucose-Capillary: 146 mg/dL — ABNORMAL HIGH (ref 65–99)

## 2016-09-06 LAB — POCT I-STAT 3, ART BLOOD GAS (G3+)
ACID-BASE EXCESS: 2 mmol/L (ref 0.0–2.0)
Bicarbonate: 25.8 mmol/L (ref 20.0–28.0)
O2 SAT: 98 %
PCO2 ART: 37.4 mmHg (ref 32.0–48.0)
Patient temperature: 98.2
TCO2: 27 mmol/L (ref 0–100)
pH, Arterial: 7.446 (ref 7.350–7.450)
pO2, Arterial: 105 mmHg (ref 83.0–108.0)

## 2016-09-06 LAB — COMPREHENSIVE METABOLIC PANEL
ALT: 86 U/L — ABNORMAL HIGH (ref 14–54)
AST: 90 U/L — ABNORMAL HIGH (ref 15–41)
Albumin: 3.5 g/dL (ref 3.5–5.0)
Alkaline Phosphatase: 233 U/L — ABNORMAL HIGH (ref 38–126)
Anion gap: 9 (ref 5–15)
BUN: 25 mg/dL — ABNORMAL HIGH (ref 6–20)
CO2: 24 mmol/L (ref 22–32)
Calcium: 8.7 mg/dL — ABNORMAL LOW (ref 8.9–10.3)
Chloride: 102 mmol/L (ref 101–111)
Creatinine, Ser: 1.3 mg/dL — ABNORMAL HIGH (ref 0.44–1.00)
GFR calc Af Amer: 51 mL/min — ABNORMAL LOW (ref >60)
GFR calc non Af Amer: 44 mL/min — ABNORMAL LOW (ref >60)
Glucose, Bld: 146 mg/dL — ABNORMAL HIGH (ref 65–99)
Potassium: 3.5 mmol/L (ref 3.5–5.1)
Sodium: 135 mmol/L (ref 135–145)
Total Bilirubin: 2.2 mg/dL — ABNORMAL HIGH (ref 0.3–1.2)
Total Protein: 7.3 g/dL (ref 6.5–8.1)

## 2016-09-06 LAB — CULTURE, RESPIRATORY W GRAM STAIN

## 2016-09-06 LAB — CBC
HCT: 28.2 % — ABNORMAL LOW (ref 36.0–46.0)
Hemoglobin: 9 g/dL — ABNORMAL LOW (ref 12.0–15.0)
MCH: 30 pg (ref 26.0–34.0)
MCHC: 31.9 g/dL (ref 30.0–36.0)
MCV: 94 fL (ref 78.0–100.0)
Platelets: 332 10*3/uL (ref 150–400)
RBC: 3 MIL/uL — ABNORMAL LOW (ref 3.87–5.11)
RDW: 19.2 % — ABNORMAL HIGH (ref 11.5–15.5)
WBC: 22.3 10*3/uL — ABNORMAL HIGH (ref 4.0–10.5)

## 2016-09-06 LAB — RENAL FUNCTION PANEL
ALBUMIN: 3.4 g/dL — AB (ref 3.5–5.0)
ANION GAP: 10 (ref 5–15)
BUN: 19 mg/dL (ref 6–20)
CALCIUM: 8.5 mg/dL — AB (ref 8.9–10.3)
CO2: 25 mmol/L (ref 22–32)
Chloride: 101 mmol/L (ref 101–111)
Creatinine, Ser: 1.29 mg/dL — ABNORMAL HIGH (ref 0.44–1.00)
GFR, EST AFRICAN AMERICAN: 51 mL/min — AB (ref >60)
GFR, EST NON AFRICAN AMERICAN: 44 mL/min — AB (ref >60)
GLUCOSE: 149 mg/dL — AB (ref 65–99)
PHOSPHORUS: 3.7 mg/dL (ref 2.5–4.6)
POTASSIUM: 3.4 mmol/L — AB (ref 3.5–5.1)
SODIUM: 136 mmol/L (ref 135–145)

## 2016-09-06 LAB — CULTURE, RESPIRATORY

## 2016-09-06 LAB — PROTIME-INR
INR: 1.84
Prothrombin Time: 21.5 seconds — ABNORMAL HIGH (ref 11.4–15.2)

## 2016-09-06 LAB — CARBOXYHEMOGLOBIN
CARBOXYHEMOGLOBIN: 1.9 % — AB (ref 0.5–1.5)
Methemoglobin: 0.4 % (ref 0.0–1.5)
O2 Saturation: 61.8 %
TOTAL HEMOGLOBIN: 8.8 g/dL — AB (ref 12.0–16.0)

## 2016-09-06 LAB — POCT ACTIVATED CLOTTING TIME
ACTIVATED CLOTTING TIME: 202 s
ACTIVATED CLOTTING TIME: 202 s
ACTIVATED CLOTTING TIME: 197 s
Activated Clotting Time: 197 seconds
Activated Clotting Time: 208 seconds

## 2016-09-06 LAB — PHOSPHORUS: PHOSPHORUS: 2.1 mg/dL — AB (ref 2.5–4.6)

## 2016-09-06 LAB — MAGNESIUM: MAGNESIUM: 2.5 mg/dL — AB (ref 1.7–2.4)

## 2016-09-06 LAB — HEPARIN LEVEL (UNFRACTIONATED): Heparin Unfractionated: 0.84 IU/mL — ABNORMAL HIGH (ref 0.30–0.70)

## 2016-09-06 MED ORDER — MIDAZOLAM HCL 2 MG/2ML IJ SOLN
4.0000 mg | Freq: Once | INTRAMUSCULAR | Status: DC
  Administered 2016-09-07: 4 mg via INTRAVENOUS
  Filled 2016-09-06: qty 4

## 2016-09-06 MED ORDER — PROPOFOL 500 MG/50ML IV EMUL: 5.0000 ug/kg/min | Freq: Once | INTRAVENOUS | Status: DC

## 2016-09-06 MED ORDER — NEPRO/CARBSTEADY PO LIQD
1000.0000 mL | ORAL | Status: DC
  Administered 2016-09-06 – 2016-09-11 (×7): 1000 mL
  Filled 2016-09-06 (×9): qty 1000

## 2016-09-06 MED ORDER — FAMOTIDINE 40 MG/5ML PO SUSR
20.0000 mg | Freq: Every day | ORAL | Status: DC
  Administered 2016-09-06 – 2016-10-11 (×33): 20 mg via ORAL
  Filled 2016-09-06 (×34): qty 2.5

## 2016-09-06 MED ORDER — VECURONIUM BROMIDE 10 MG IV SOLR
10.0000 mg | Freq: Once | INTRAVENOUS | Status: AC
  Administered 2016-09-07: 10 mg via INTRAVENOUS

## 2016-09-06 MED ORDER — ETOMIDATE 2 MG/ML IV SOLN
40.0000 mg | Freq: Once | INTRAVENOUS | Status: AC
  Administered 2016-09-07: 20 mg via INTRAVENOUS

## 2016-09-06 MED ORDER — FENTANYL CITRATE (PF) 100 MCG/2ML IJ SOLN
200.0000 ug | Freq: Once | INTRAMUSCULAR | Status: AC
  Administered 2016-09-07: 200 ug via INTRAVENOUS
  Filled 2016-09-06: qty 4

## 2016-09-06 MED ORDER — NEPRO/CARBSTEADY PO LIQD: 1000.0000 mL | ORAL | Status: DC

## 2016-09-06 MED ORDER — SODIUM CHLORIDE 0.9 % IV SOLN
500.0000 mg | Freq: Three times a day (TID) | INTRAVENOUS | Status: DC
  Administered 2016-09-06 – 2016-09-11 (×15): 500 mg via INTRAVENOUS
  Filled 2016-09-06 (×17): qty 500

## 2016-09-06 MED ORDER — SODIUM PHOSPHATES 45 MMOLE/15ML IV SOLN
40.0000 meq | Freq: Once | INTRAVENOUS | Status: AC
  Administered 2016-09-06: 40 meq via INTRAVENOUS
  Filled 2016-09-06: qty 10

## 2016-09-06 MED ORDER — PRO-STAT SUGAR FREE PO LIQD
30.0000 mL | Freq: Every day | ORAL | Status: DC
  Administered 2016-09-06 – 2016-09-11 (×20): 30 mL
  Filled 2016-09-06 (×20): qty 30

## 2016-09-06 NOTE — Progress Notes (Signed)
Subjective:   Remains critically ill overnight-pressor doses stable  CRRT running well  Objective Vital signs in last 24 hours: Vitals:   09/06/16 0645 09/06/16 0700 09/06/16 0715 09/06/16 0818  BP:  104/68  104/68  Pulse: (!) 102 (!) 102 (!) 102 (!) 103  Resp: _0 (!) 23  Temp:      TempSrc:      SpO2: 100% 100% 100% 100%  Weight:      Height:       Weight change: -1.2 kg (-2 lb 10.3 oz)  Intake/Output Summary (Last 24 hours) at 09/06/16 7209 Last data filed at 09/06/16 4709  Gross per 24 hour  Intake          3206.24 ml  Output             3433 ml  Net          -226.76 ml    Assessment/ Plan: Pt is a 60 y.o. yo female who was admitted on 08/18/2016 with STEMI- then severe MR- s/p stenting and eventually MVR - clinical decline of late with reintubation- possible PNA Assessment/Plan: 1. Renal- A on CRF- essentially on CRRT since 9/14- most recent dialysis catheter in since 9/28- still unstable requiring pressors.  Given this prolonged AKI - HD dep for nearly 3  weeks would think renal recovery is becoming less likely as time passes  2. HTN/vol-  Running even with CRRT- peripheral edema with low alb- CVP in the low teens -CTS giving albumin - OK to give separate from CRRT  3. Anemia- this AM in the 8's- added ESA- transfuse as needed per primary (one unit 10/3) 4. Secondary hyperparathyroidism- s/p phos repletion- needs again-  no binders- K is stable  5. CV- per CTS- s/p MVR - atrial arrythmias- on pressors and midodrine- s/p cardioversion 10/2 6. ID- high WBC on vanc /zosyn   Cortland Crehan A    Labs: Basic Metabolic Panel:  Recent Labs Lab 09/05/16 0405 09/05/16 1507 09/06/16 0420  NA 134* 136 135  K 3.6 3.6 3.5  CL 102 105 102  CO2 _1 GLUCOSE 147* 142* 146*  BUN 25* 25* 25*  CREATININE 1.34* 1.27* 1.30*  CALCIUM 8.5* 8.2* 8.7*  PHOS 2.1* 3.6 2.1*   Liver Function Tests:  Recent Labs Lab 09/04/16 0415  09/05/16 0405 09/05/16 1507  09/06/16 0420  AST 55*  --  66*  --  90*  ALT 78*  --  73*  --  86*  ALKPHOS 140*  --  190*  --  233*  BILITOT 1.5*  --  1.4*  --  2.2*  PROT 6.2*  --  6.6  --  7.3  ALBUMIN 2.5*  < > 3.1* 3.1* 3.5  < > = values in this interval not displayed. No results for input(s): LIPASE, AMYLASE in the last 168 hours.  Recent Labs Lab 09/02/16 1752  AMMONIA 23   CBC:  Recent Labs Lab 09/03/16 0330  09/04/16 0415 09/05/16 0405 09/05/16 1507 09/06/16 0420  WBC 33.0*  --  24.0* 23.5* 22.1* 22.3*  HGB 9.0*  < > 8.3* 7.7* 8.8* 9.0*  HCT 27.1*  < > 26.0* 24.3* 27.6* 28.2*  MCV 89.7  --  91.5 93.5 92.6 94.0  PLT 359  --  325 306 324 332  < > = values in this interval not displayed. Cardiac Enzymes: No results for input(s): CKTOTAL, CKMB, CKMBINDEX, TROPONINI in the last 168 hours. CBG:  Recent Labs Lab  09/05/16 1159 09/05/16 1926 09/05/16 2354 09/06/16 0355 09/06/16 0754  GLUCAP 221* 163* 161* 140* 146*    Iron Studies: No results for input(s): IRON, TIBC, TRANSFERRIN, FERRITIN in the last 72 hours. Studies/Results: Dg Chest Port 1 View  Result Date: 09/05/2016 CLINICAL DATA:  ett present,,MVR EXAM: PORTABLE CHEST 1 VIEW COMPARISON:  09/04/2016 FINDINGS: Endotracheal tube remains 1 cm from carina. Enteric tube and central venous lines unchanged. Stable cardiac silhouette. Small bilateral pleural effusions. No pneumothorax. IMPRESSION: 1. No interval change. 2. Stable support apparatus. 3. Small effusions. Electronically Signed   By: Suzy Bouchard M.D.   On: 09/05/2016 07:44   Dg Chest Port 1 View  Result Date: 09/04/2016 CLINICAL DATA:  Hypoxia EXAM: PORTABLE CHEST 1 VIEW COMPARISON:  September 02, 2016 FINDINGS: Endotracheal tube tip is 1.4 cm above the carina. Central catheter tips are in the superior vena cava within dual-lumen catheter tip essentially at the cavoatrial junction. Feeding tube tip below the diaphragm and not seen. Temporary pacemaker wires are attached to the  right heart. No pneumothorax. There is atelectatic change in the left base. There is a questionable small right pleural effusion. Lungs elsewhere clear. Heart is upper normal in size with pulmonary vascularity within normal limits. Patient is status post mitral valve replacement. IMPRESSION: Tube and catheter positions as described without pneumothorax. Small right pleural effusion. Stable left base atelectasis. Stable cardiac silhouette. Electronically Signed   By: Lowella Grip III M.D.   On: 09/04/2016 08:32   Medications: Infusions: . sodium chloride    . sodium chloride 20 mL/hr at 09/05/16 1900  . sodium chloride    . amiodarone 30 mg/hr (09/06/16 0344)  . heparin 10,000 units/ 20 mL infusion syringe 1,450 Units/hr (09/06/16 0700)  . milrinone 0.252 mcg/kg/min (09/05/16 1900)  . norepinephrine (LEVOPHED) Adult infusion 5 mcg/min (09/06/16 0645)  . dialysis replacement fluid (prismasate) 600 mL/hr at 09/05/16 0750  . dialysis replacement fluid (prismasate) 200 mL/hr at 09/05/16 2042  . dialysate (PRISMASATE) 1,000 mL/hr at 09/06/16 0610  . vasopressin (PITRESSIN) infusion - *FOR SHOCK* 0.01 Units/min (09/06/16 8101)    Scheduled Medications: . albumin human  12.5 g Intravenous Q6H  . atorvastatin  20 mg Oral q1800  . chlorhexidine gluconate (MEDLINE KIT)  15 mL Mouth Rinse BID  . darbepoetin (ARANESP) injection - NON-DIALYSIS  100 mcg Subcutaneous Q Mon-1800  . famotidine  20 mg Oral BID  . feeding supplement (NEPRO CARB STEADY)  1,000 mL Oral Q24H  . feeding supplement (PRO-STAT SUGAR FREE 64)  30 mL Per Tube TID  . fluconazole (DIFLUCAN) IV  100 mg Intravenous Q24H  . Gerhardt's butt cream   Topical BID  . insulin aspart  0-24 Units Subcutaneous Q4H  . insulin detemir  10 Units Subcutaneous BID  . mouth rinse  15 mL Mouth Rinse 10 times per day  . midodrine  5 mg Oral TID WC  . piperacillin-tazobactam  3.375 g Intravenous Q6H  . sodium chloride flush  3 mL Intravenous Q12H   . sodium chloride flush  3 mL Intravenous Q12H  . vancomycin  750 mg Intravenous Q24H    have reviewed scheduled and prn medications.  Physical Exam: General: sedated- but arousable Heart: tachy Lungs: CBS bilat Abdomen: soft, non tender Extremities: pitting edema Dialysis Access: left IJ vascath placed 9/28    09/06/2016,8:21 AM  LOS: 19 days

## 2016-09-06 NOTE — Progress Notes (Signed)
Nutrition Follow Up  DOCUMENTATION CODES:   Not applicable  INTERVENTION:    Re-start Nepro formula at goal rate of 20 ml/h (240 ml per day) and Prostat 30 ml 5 times daily to provide 1364 kcals, 114 gm protein, 334 ml free water daily.  NUTRITION DIAGNOSIS:   Inadequate oral intake related to inability to eat as evidenced by NPO status; ongoing  GOAL:   Patient will meet greater than or equal to 90% of their needs; met  MONITOR:   Vent status, TF tolerance, Labs, Weight trends, Skin, I & O's  ASSESSMENT:   60 y/o woman with DM2, HTN, HL and noncompliance presented [facility] ER with infrerior posterior STEMI on 9/12. Taken to cath lab and found to have occluded small to moderate OM-1 branch. Underwent successful PCS with stent x 2 by Dr. Clayborn Bigness. Coronaries otherwise ok. EF 55-60% with severe MR confirmed by echo.    Pt s/p procedures 9/20: MITRAL VALVE REPAIR (MVR)  CORONARY ARTERY BYPASS GRAFTING (CABG)    Patient is currently intubated on ventilator support (re-intubated 10/1) MV: 9.3 L/min Temp (24hrs), Avg:98.2 F (36.8 C), Min:97.5 F (36.4 C), Max:98.4 F (36.9 C)  Pt continues on CVVHD. S/p bedside swallow evaluation 9/30 >> SLP recommending continued NPO status. New CORTRAK tube re-placed again 9/30 >> tip in distal duodenum.  Nepro formula off upon visit, however, previously infusing at 35 ml/hr with Prostat 30 ml TID. Plan is for trach tomorrow.   CBG (last 3)   Recent Labs  09/06/16 0355 09/06/16 0754 09/06/16 1146  GLUCAP 140* 146* 162*   Diet Order:  Diet NPO time specified Diet NPO time specified  Skin:  Reviewed, no issues  Last BM:  10/3  Height:   Ht Readings from Last 1 Encounters:  09/02/16 5' 1" (1.549 m)    Weight:   Wt Readings from Last 1 Encounters:  09/06/16 144 lb 13.5 oz (65.7 kg)  Admit weight (08/18/16) 156 lb (71.2 kg)  Ideal Body Weight:  47.7 kg  BMI:  Body mass index is 27.37 kg/m.  Estimated Nutritional  Needs:   Kcal:  3358  Protein:  110-120 gm  Fluid:  per MD  EDUCATION NEEDS:   No education needs identified at this time  Arthur Holms, RD, LDN Pager #: [phone] After-Hours Pager #: [phone]

## 2016-09-06 NOTE — Progress Notes (Signed)
Patient ID: Deborah [name], female   DOB: December 05, 1955, 60 y.o.   MRN: [id number]   60 y/o woman with DM2, HTN, HL and noncompliance presented to [facility] ER with infrerior posterior STEMI on 9/12. Taken to cath lab and found to have occluded small to moderate OM-1 branch. Underwent successful PCS with stent x 2 by Dr. Clayborn Bigness. Coronaries otherwise ok. EF 55-60% with severe MR confirmed by echo.   Post-cath developed respiratory failure and required intubation. Developed shock and renal failure with peak creatinine 3.4 (1.6 on admit). Trialysis catheter placed.   Patient persistently acidotic with concern for ischemic MR and transferred here. En route develop severe hypotension and norepinephrine started.   Brought to cath lab 9/15 and TEE showed normal LV (EF 60-65%) and normal RV function with ruptured posterior papillary muscle with severe posterior MR.  Underwent placement of Swan and IABP.   Treated for group B Strep PNA.   S/P MVR 08/23/16.  IABP pulled 9/22.   Echo 9/23  RV and LV are normal (EF 60%) but appeared underfilled   Extubated 9/29.  However, picture consistent with septic shock on 9/30 so pressors started and she was re-intubated.  S/P TEE DC-CV 09/04/16    Weaning pressors. Remains on milrinone 0.25 + norepi 2 mcg + vasopressin 0.01 units. Blood cultures no growth today. Todays CO-OX 62%.  CVVH but not pulling fluid.    Scheduled Meds: . albumin human  12.5 g Intravenous Q6H  . atorvastatin  20 mg Oral q1800  . chlorhexidine gluconate (MEDLINE KIT)  15 mL Mouth Rinse BID  . darbepoetin (ARANESP) injection - NON-DIALYSIS  100 mcg Subcutaneous Q Mon-1800  . famotidine  20 mg Oral BID  . feeding supplement (NEPRO CARB STEADY)  1,000 mL Oral Q24H  . feeding supplement (PRO-STAT SUGAR FREE 64)  30 mL Per Tube TID  . fluconazole (DIFLUCAN) IV  100 mg Intravenous Q24H  . Gerhardt's butt cream   Topical BID  . insulin aspart  0-24 Units Subcutaneous Q4H  . insulin detemir  10  Units Subcutaneous BID  . mouth rinse  15 mL Mouth Rinse 10 times per day  . midodrine  5 mg Oral TID WC  . piperacillin-tazobactam  3.375 g Intravenous Q6H  . sodium chloride flush  3 mL Intravenous Q12H  . sodium chloride flush  3 mL Intravenous Q12H  . sodium phosphate  Dextrose 5% IVPB  40 mEq Intravenous Once  . vancomycin  750 mg Intravenous Q24H   Continuous Infusions: . sodium chloride    . sodium chloride 20 mL/hr at 09/05/16 1900  . sodium chloride    . amiodarone 30 mg/hr (09/06/16 0344)  . heparin 10,000 units/ 20 mL infusion syringe 1,450 Units/hr (09/06/16 0700)  . milrinone 0.252 mcg/kg/min (09/05/16 1900)  . norepinephrine (LEVOPHED) Adult infusion 2 mcg/min (09/06/16 0900)  . dialysis replacement fluid (prismasate) 600 mL/hr at 09/06/16 0901  . dialysis replacement fluid (prismasate) 200 mL/hr at 09/05/16 2042  . dialysate (PRISMASATE) 1,000 mL/hr at 09/06/16 0610  . vasopressin (PITRESSIN) infusion - *FOR SHOCK* 0.01 Units/min (09/06/16 0212)   PRN Meds:.Place/Maintain arterial line **AND** sodium chloride, fentaNYL (SUBLIMAZE) injection, heparin, HYDROcodone-acetaminophen, midazolam, ondansetron (ZOFRAN) IV, sodium chloride, sodium chloride flush, sodium chloride flush    Vitals:   09/06/16 0700 09/06/16 0715 09/06/16 0818 09/06/16 0831  BP: 104/68  104/68 98/81  Pulse: (!) 102 (!) 102 (!) 103 (!) 104  Resp: 17 19 (!) 23 19  Temp:  98.4 F (36.9 C)  TempSrc:    Axillary  SpO2: 100% 100% 100% 100%  Weight:      Height:        Intake/Output Summary (Last 24 hours) at 09/06/16 0918 Last data filed at 09/06/16 0903  Gross per 24 hour  Intake          3168.71 ml  Output             3479 ml  Net          -310.29 ml    LABS: Basic Metabolic Panel:  Recent Labs  09/05/16 0405 09/05/16 1507 09/06/16 0420  NA 134* 136 135  K 3.6 3.6 3.5  CL 102 105 102  CO2 24 23 24   GLUCOSE 147* 142* 146*  BUN 25* 25* 25*  CREATININE 1.34* 1.27* 1.30*  CALCIUM  8.5* 8.2* 8.7*  MG 2.6*  --  2.5*  PHOS 2.1* 3.6 2.1*   Liver Function Tests:  Recent Labs  09/05/16 0405 09/05/16 1507 09/06/16 0420  AST 66*  --  90*  ALT 73*  --  86*  ALKPHOS 190*  --  233*  BILITOT 1.4*  --  2.2*  PROT 6.6  --  7.3  ALBUMIN 3.1* 3.1* 3.5   No results for input(s): LIPASE, AMYLASE in the last 72 hours. CBC:  Recent Labs  09/05/16 1507 09/06/16 0420  WBC 22.1* 22.3*  HGB 8.8* 9.0*  HCT 27.6* 28.2*  MCV 92.6 94.0  PLT 324 332   Cardiac Enzymes: No results for input(s): CKTOTAL, CKMB, CKMBINDEX, TROPONINI in the last 72 hours. BNP: Invalid input(s): POCBNP D-Dimer: No results for input(s): DDIMER in the last 72 hours. Hemoglobin A1C: No results for input(s): HGBA1C in the last 72 hours. Fasting Lipid Panel: No results for input(s): CHOL, HDL, LDLCALC, TRIG, CHOLHDL, LDLDIRECT in the last 72 hours. Thyroid Function Tests: No results for input(s): TSH, T4TOTAL, T3FREE, THYROIDAB in the last 72 hours.  Invalid input(s): FREET3 Anemia Panel: No results for input(s): VITAMINB12, FOLATE, FERRITIN, TIBC, IRON, RETICCTPCT in the last 72 hours.  PHYSICAL EXAM CVP 13-14 General: Intubated/awake Neck: RIJ TLC LIJ trialysis  Lungs: Clear anteriorly CV: Nondisplaced PMI.  Heart tachy, regular S1/S2, no S3/S4, 1/6 SEM USB.   Abdomen: Soft, ND, no HSM. No bruits or masses. +BS  Neurologic: Awake.   Extremities: No clubbing or cyanosis. R and LLE SCDs.   TELEMETRY: NSR 90-100s .   Assessment:   1. Cardiogenic shock: Due to acute severe MR. EF 70% on 9/25 echo.  2. CAD: Inferior posterior STEMI on 9/13 with DES x 2 to OM-1      --Plavix restarted on 9/22 3. 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     --s/p MVR 08/23/2016  4. AKI on CKD stage III - due to ATN likely from cardiogenic shock + contrast with cath. Has required CVVH.  5. Acute respiratory failure: Pulmonary  edema.  6. DM2 7. Suspect component of septic shock: Possible PNA, group B Strep in sputum culture.  8. Anemia 9. PVCs   10. Shock liver 11. MRSA bacteremia 12. Atrial fibrillation: Paroxysmal.  On amiodarone.     Plan/Discussion:   Remains intubated.   Septic shock,  WBCs  22.  Remains on Vancomycin/Zosyn/Diflucan .    Remains on Norepinephrine 2 mcg + milrinone 0.25 mcg+ vasopressin 0.01 units. Marland Kitchen Co-ox up to 62%.  Blood cultures - NGTD.  No bb  with low output.    Continues on CVVH.  Not pulling fluid today with low BP.   S/P TEE DC-CV on 10/3 . Continue  amiodarone gtt . Maintaining NSR.   Hemoglobin up to 9.    Deborah Ninfa Meeker, NP-C  09/06/2016  Advanced Heart Failure Team Pager [phone] (M-F; 7a - 4p)  Please contact [facility] for night-coverage after hours (4p -7a ) and weekends on [email or web address]  Patient seen and examined with Darrick Grinder, NP. We discussed all aspects of the encounter. I agree with the assessment and plan as stated above.   More alert today. Weaning vasopressors as tolerated. Remains on CVVHD and keeping her even. Maintaining NSR on amio. Sputum cx with resistant enterobacter. No on imipenem. Plavix on hold for trach tomorrow.   Deborah Buckle,MD 8:23 PM

## 2016-09-06 NOTE — Progress Notes (Addendum)
6 Days Post-Op Procedure(s) (LRB): INSERTION OF DIALYSIS CATHETER LEFT INTERNAL JUGULAR VEIN & INSERTION OF TRIPLE LUMEN RIGHT INTERNAL JUGULAR VEIN (Bilateral) Subjective: Looks better- more engaged, responsive Co-ox > 60% Trach planned tomorrow Tolerating pressure support trial today Slowly weaning vasopressin  Objective: Vital signs in last 24 hours: Temp:  [97.5 F (36.4 C)-98.4 F (36.9 C)] 98.1 F (36.7 C) (10/04 1502) Pulse Rate:  [93-108] 104 (10/04 1615) Cardiac Rhythm: Atrial flutter (10/04 0800) Resp:  [14-29] 18 (10/04 1615) BP: (90-136)/(57-92) 102/80 (10/04 1615) SpO2:  [100 %] 100 % (10/04 1615) Arterial Line BP: (69-149)/(48-76) 106/53 (10/04 1615) FiO2 (%):  [40 %] 40 % (10/04 1502) Weight:  [144 lb 13.5 oz (65.7 kg)] 144 lb 13.5 oz (65.7 kg) (10/04 0419)  Hemodynamic parameters for last 24 hours: CVP:  [11 mmHg-21 mmHg] 11 mmHg  Intake/Output from previous day: 10/03 0701 - 10/04 0700 In: 3294.5 [I.V.:1209.5; Blood:275; NG/GT:1110; IV Piggyback:700] Out: 8588  Intake/Output this shift: Total I/O In: 1334.5 [I.V.:366.5; NG/GT:210; IV Piggyback:758] Out: 1463 [Other:1463]  Mild edema Abdomen soft Cortrack tube in place,secure  Lab Results:  Recent Labs  09/05/16 1507 09/06/16 0420  WBC 22.1* 22.3*  HGB 8.8* 9.0*  HCT 27.6* 28.2*  PLT 324 332   BMET:  Recent Labs  09/06/16 0420 09/06/16 1605  NA 135 136  K 3.5 3.4*  CL 102 101  CO2 24 25  GLUCOSE 146* 149*  BUN 25* 19  CREATININE 1.30* 1.29*  CALCIUM 8.7* 8.5*    PT/INR:  Recent Labs  09/06/16 1605  LABPROT 21.5*  INR 1.84   ABG    Component Value Date/Time   PHART 7.446 09/06/2016 0425   HCO3 25.8 09/06/2016 0425   TCO2 27 09/06/2016 0425   ACIDBASEDEF 4.1 (H) 09/02/2016 1850   O2SAT 61.8 09/06/2016 0425   O2SAT 98.0 09/06/2016 0425   CBG (last 3)   Recent Labs  09/06/16 0754 09/06/16 1146 09/06/16 1548  GLUCAP 146* 162* 145*    Assessment/Plan: S/P  Procedure(s) (LRB): INSERTION OF DIALYSIS CATHETER LEFT INTERNAL JUGULAR VEIN & INSERTION OF TRIPLE LUMEN RIGHT INTERNAL JUGULAR VEIN (Bilateral) Trach tomorrow - resume plavix after trach Cont current care   LOS: 19 days    Deborah [name] 09/06/2016

## 2016-09-06 NOTE — Progress Notes (Signed)
PULMONARY / CRITICAL CARE MEDICINE   Name: Deborah [name] MRN: [id number] DOB: 1956/09/26    ADMISSION DATE:  08/18/2016 CONSULTATION DATE:  08/18/2016  REFERRING MD:  Bensimhon   CHIEF COMPLAINT:  Ventilator management and critical care services   HISTORY OF PRESENT ILLNESS:   60 yo female admitted to [facility] 08/15/16 with STEMI s/p cath to [place].  Developed cardiogenic shock, VDRF (ETT 9/14), AKI with metabolic acidosis and started on CRRT 08/17/16.  Transferred to [facility] to tx shock and evaluated new severe MR from papillary muscle rupture.  She also had fever and found to have Group B Strep in sputum culture. Mitral valve replacement 9/20.  SUBJECTIVE:  Reintubated over the weekend. Failed extubation on Friday.  Afebrile on CRRT, remains critically ill On levophed, vasopressin drips and milrinone drips Had DC cardioversion on 10/2   VITAL SIGNS: BP 101/66   Pulse (!) 102   Temp 98.4 F (36.9 C) (Axillary)   Resp 15   Ht 5\' 1"  (1.549 m)   Wt 65.7 kg (144 lb 13.5 oz)   SpO2 100%   BMI 27.37 kg/m   HEMODYNAMICS: CVP:  [10 mmHg-17 mmHg] 15 mmHg  VENTILATOR SETTINGS: Vent Mode: PSV;CPAP FiO2 (%):  [40 %] 40 % Set Rate:  [18 bmp] 18 bmp Vt Set:  [420 mL] 420 mL PEEP:  [5 cmH20] 5 cmH20 Pressure Support:  [10 cmH20] 10 cmH20 Plateau Pressure:  [11 cmH20-22 cmH20] 22 cmH20  INTAKE / OUTPUT: I/O last 3 completed shifts: In: 4753.4 [I.V.:1938.4; Blood:275; NG/GT:1590; IV Piggyback:950] Out: 5597 [Other:5064]  PHYSICAL EXAMINATION: General: acutely ill, sedate, moves spont HENT: NCAT  PULM: few rhonchi bilaterally, decreased BS CV: RRR, systolic murmur noted GI: BS+, soft, nontender MSK: normal bulk and tone. Gr 2 edema Neuro: does not Follow commands  LABS:  BMET  Recent Labs Lab 09/05/16 0405 09/05/16 1507 09/06/16 0420  NA 134* 136 135  K 3.6 3.6 3.5  CL 102 105 102  CO2 24 23 24   BUN 25* 25* 25*  CREATININE 1.34* 1.27* 1.30*  GLUCOSE 147* 142* 146*     Electrolytes  Recent Labs Lab 09/04/16 0415  09/05/16 0405 09/05/16 1507 09/06/16 0420  CALCIUM 8.0*  < > 8.5* 8.2* 8.7*  MG 2.4  --  2.6*  --  2.5*  PHOS 2.7  < > 2.1* 3.6 2.1*  < > = values in this interval not displayed.  CBC  Recent Labs Lab 09/05/16 0405 09/05/16 1507 09/06/16 0420  WBC 23.5* 22.1* 22.3*  HGB 7.7* 8.8* 9.0*  HCT 24.3* 27.6* 28.2*  PLT 306 324 332    Coag's  Recent Labs Lab 09/04/16 1130  INR 1.92    Sepsis Markers  Recent Labs Lab 09/02/16 1107 09/03/16 0330 09/04/16 0415  PROCALCITON 4.63 16.37 33.02    ABG  Recent Labs Lab 09/04/16 0019 09/05/16 0406 09/06/16 0425  PHART 7.475* 7.466* 7.446  PCO2ART 31.8* 35.7 37.4  PO2ART 115.0* 120.0* 105.0    Liver Enzymes  Recent Labs Lab 09/04/16 0415  09/05/16 0405 09/05/16 1507 09/06/16 0420  AST 55*  --  66*  --  90*  ALT 78*  --  73*  --  86*  ALKPHOS 140*  --  190*  --  233*  BILITOT 1.5*  --  1.4*  --  2.2*  ALBUMIN 2.5*  < > 3.1* 3.1* 3.5  < > = values in this interval not displayed.  Cardiac Enzymes No results for input(s): TROPONINI, PROBNP in  the last 168 hours.  Glucose  Recent Labs Lab 09/05/16 1159 09/05/16 1557 09/05/16 1926 09/05/16 2354 09/06/16 0355 09/06/16 0754  GLUCAP 221* 160* 163* 161* 140* 146*    Imaging No results found.  STUDIES:  LHC 9/12: PCI to OM1, severe MR, EF 55 to 60% RHC 9/15: RA 5, RV 37/3/6, PA 38/18/26, PCWP 15, CI 1.9, PVR 3.3 WU TEE 9/15: EF 60 to 65%, flail motion MV with severe MR RUQ ultrasound 9/23 > Gallbladder wall thickening noted, suggestion of sludge, small R effusion, liver unremarkable but left lobe not well visulalized  MICROBIOLOGY: MRSA PCR 9/12:  Negative MRSA PCR 9/15:  Negative Tracheal Asp Ctx 9/15:  Strep agalactiae  Blood Ctx x2 9/15:  Negative  MRSA PCR 9/19:  Negative  Blood culture 9/24: coag neg staph 2/4  ANTIBIOTICS: Zosyn 9/16 - 9/17 Vancomycin 9/16 - 9/17 Rocephin 9/17 >>  9/24 Vancomycin 9/20 (surgical prophylaxis) Vanc 9/25 > Zosyn 9/25 >  Fluconazole 9/25 >   SIGNIFICANT EVENTS: 9/12 Admit to [facility], PCI to [place] 9/14 ETT, CRRT 9/15 to [facility] 9/16 off insulin gtt 9/20 To OR for Mitral Valve Repair w/ bioprosthetic valve 9/28 To OR for permcath 9/29 Extubated. 9/30 Reintubated  LINES/TUBES: Right IJ PICC 9/12 - 9/20 Rt femoral introducer 9/15 - 9/20 OETT 8.0 9/14 >> 9/29, 9/30 >> HD cath left IJ 9/14 >> R IJ Introducer/CVL/Swan 9/20 >>  9/25 IABP 9/15 >>9/22 Lt femoral a line 9/15 >> 9/25 Y Chest Tube/Mediastinal Chest Tube 9/20 >>out PIV x1 L brachial art line 9/25 > 9/28 R IJ CVL > 9/28 L IJ Permcath >   ASSESSMENT / PLAN:  PULMONARY A: Acute Hypoxic Respiratory Failure - Secondary to acute pulmonary edema, reintubated 9/30 Aspiration risk P:   PS but no extubation. Trach in AM. Continue to hold plavix for trach in AM.  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 Post-op Mgt per CVTS/Cardiology. Titrate levo gtt, cont vaso drip. ASA, per cardiology/CVTS  Amio, Milrinone gtt's per cardiology/CVTS.  RENAL A:   Acute Renal Failure - Baseline creatinine 1.6-1.7  P:   Nephrology Following Keep even on CVVHD Monitor BMET and UOP Replace electrolytes as needed  GASTROINTESTINAL A:   Transaminitis - peaked 9/23, near complete resolution, unclear etiology Mild Malnutrition - Related to critical illness  P:   Pepcid IV q12hr Replaced NGT - cont TF   HEMATOLOGIC A:   Anemia - Last transfusion 9/20 Leukocytosis - Multifactorial  P:  Trending cell counts w/ CBC SCDs  INFECTIOUS A:   Group B Strep Pneumonia Coag negative staph bacteremia (likely contaminant) Line changes on 9/28 Leucocytosis P:   Vanc and zosyn and fluconazole restarted on 9/25  > stopped  9/29 but resumed 9/30, high pct noted.  Re-culture for fever.  ENDOCRINE A:   DM - Glucose controlled  P:   Subcutaneous insulin per CVTS Levemir BID  NEUROLOGIC A:   New onset R sided hemiplegia and Preferental gaze to Right (9/25). Cranial CT scan was (-) for acute changes >> resolved without evidence of weakness on 9/29 Acute Encephalopathy - Multifactorial and likely combination toxic metabolic & hypoxia   P:   Follow neuro exam Fent and versed prn pain  Plan trach in AM at 11 AM  The patient is critically ill with multiple organ systems failure and requires high complexity decision  making for assessment and support, frequent evaluation and titration of therapies, application of advanced monitoring technologies and extensive interpretation of multiple databases.   Critical Care Time devoted to patient care services described in this note is  35  Minutes. This time reflects time of care of this signee Dr Jennet Maduro. This critical care time does not reflect procedure time, or teaching time or supervisory time of PA/NP/Med student/Med Resident etc but could involve care discussion time.  Rush Farmer, M.D. [facility] Pulmonary/Critical Care Medicine. Pager: [phone]. After hours pager: [phone].  09/06/2016, 10:33 AM

## 2016-09-06 NOTE — Progress Notes (Signed)
Pharmacy Antibiotic Note  Deborah [name] is a 60 y.o. female  on vancomycin and zosyn for fever after completing therapy with ceftriaxone for group B strep/MSSA pneumonia. She is also on CRRT.  Antibiotics stopped 9/29, but resumed 9/30 with hypotension and increased WBC 33>24, Tm 99.2 (no interruptions in CRRT)  VT 18 on vancomcyin 750mg  q24 TA Cx resulted 10/4 with enterobacter R to zosyn, S imipenem, cipro Change zosyn to Imipenem  Plan: Vancomycin 750 mg IV Q 24 hours Stop Zosyn  Imipenem 500mg  q8 while on CRRT - adjust as needed when CRRT stops Fluconazole 100mg  q24h  Height: 5\' 1"  (154.9 cm) Weight: 144 lb 13.5 oz (65.7 kg) IBW/kg (Calculated) : 47.8  Temp (24hrs), Avg:98.2 F (36.8 C), Min:97.5 F (36.4 C), Max:98.4 F (36.9 C)   Recent Labs Lab 08/31/16 0615  09/03/16 0330  09/04/16 0415 09/04/16 0915 09/04/16 1600 09/05/16 0405 09/05/16 1507 09/06/16 0420  WBC  --   < > 33.0*  --  24.0*  --   --  23.5* 22.1* 22.3*  CREATININE  --   < > 1.21*  < > 1.33*  --  1.31* 1.34* 1.27* 1.30*  VANCOTROUGH 21*  --   --   --   --  18  --   --   --   --   < > = values in this interval not displayed.  Estimated Creatinine Clearance: 40.5 mL/min (by C-G formula based on SCr of 1.3 mg/dL (H)).    No Known Allergies  Antimicrobials this admission: 9/16 Vancomycin >> 9/18, 9/20>>9/22, resume 9/25>>9/29, resume 9/30> 9/16 Zosyn >> 9/18, resume 9/25>>9/29, resume 9/30> 10/4 10/4 Imipenem  9/25 Fluconazole x 3 doses resume 10/2> CTX 9/18>> 9/24  Dose adjustments this admission: 9/17 Vanc random = 16, cont 1 g q48h 9/28 Vanc trough (on CRRT) = 21,  1g q24 10/2 VT(on CRRT) 18 on vancomycin 750mg  qd  Microbiology results: 9/29 BCx:  9/15 BCx: ngf 9/15 Sputum: mod group b strep/MSSA 9/15 MRSA PCR: negative 9/24 BCx2: 1/2 BCID CoNS likely contaminant 9/30 Bcx IP 10/1 TA enterobacter - S imipenem, cipro  Bonnita Nasuti Pharm.D. CPP, BCPS Clinical  Pharmacist [phone] 09/06/2016 2:33 PM

## 2016-09-07 ENCOUNTER — Inpatient Hospital Stay (HOSPITAL_COMMUNITY): Payer: Medicaid Other

## 2016-09-07 LAB — RENAL FUNCTION PANEL
ALBUMIN: 3.2 g/dL — AB (ref 3.5–5.0)
Anion gap: 13 (ref 5–15)
BUN: 21 mg/dL — AB (ref 6–20)
CALCIUM: 8.5 mg/dL — AB (ref 8.9–10.3)
CO2: 21 mmol/L — AB (ref 22–32)
CREATININE: 1.27 mg/dL — AB (ref 0.44–1.00)
Chloride: 99 mmol/L — ABNORMAL LOW (ref 101–111)
GFR calc Af Amer: 52 mL/min — ABNORMAL LOW (ref >60)
GFR calc non Af Amer: 45 mL/min — ABNORMAL LOW (ref >60)
Glucose, Bld: 240 mg/dL — ABNORMAL HIGH (ref 65–99)
Phosphorus: 4 mg/dL (ref 2.5–4.6)
Potassium: 3.6 mmol/L (ref 3.5–5.1)
SODIUM: 133 mmol/L — AB (ref 135–145)

## 2016-09-07 LAB — COMPREHENSIVE METABOLIC PANEL
ALT: 132 U/L — ABNORMAL HIGH (ref 14–54)
AST: 148 U/L — ABNORMAL HIGH (ref 15–41)
Albumin: 3.3 g/dL — ABNORMAL LOW (ref 3.5–5.0)
Alkaline Phosphatase: 230 U/L — ABNORMAL HIGH (ref 38–126)
Anion gap: 13 (ref 5–15)
BUN: 19 mg/dL (ref 6–20)
CO2: 23 mmol/L (ref 22–32)
Calcium: 8.7 mg/dL — ABNORMAL LOW (ref 8.9–10.3)
Chloride: 99 mmol/L — ABNORMAL LOW (ref 101–111)
Creatinine, Ser: 1.29 mg/dL — ABNORMAL HIGH (ref 0.44–1.00)
GFR calc Af Amer: 51 mL/min — ABNORMAL LOW (ref >60)
GFR calc non Af Amer: 44 mL/min — ABNORMAL LOW (ref >60)
Glucose, Bld: 105 mg/dL — ABNORMAL HIGH (ref 65–99)
Potassium: 3.5 mmol/L (ref 3.5–5.1)
Sodium: 135 mmol/L (ref 135–145)
Total Bilirubin: 1.5 mg/dL — ABNORMAL HIGH (ref 0.3–1.2)
Total Protein: 7 g/dL (ref 6.5–8.1)

## 2016-09-07 LAB — CARBOXYHEMOGLOBIN
Carboxyhemoglobin: 1.3 % (ref 0.5–1.5)
Carboxyhemoglobin: 1.2 % (ref 0.5–1.5)
Carboxyhemoglobin: 0.9 % (ref 0.5–1.5)
Methemoglobin: 0.6 % (ref 0.0–1.5)
Methemoglobin: 0.8 % (ref 0.0–1.5)
Methemoglobin: 1.2 % (ref 0.0–1.5)
O2 SAT: 45.7 %
O2 Saturation: 43.6 %
O2 Saturation: 59.5 %
Total hemoglobin: 10.3 g/dL — ABNORMAL LOW (ref 12.0–16.0)
Total hemoglobin: 8.9 g/dL — ABNORMAL LOW (ref 12.0–16.0)
Total hemoglobin: 9.2 g/dL — ABNORMAL LOW (ref 12.0–16.0)

## 2016-09-07 LAB — BLOOD GAS, ARTERIAL
ACID-BASE EXCESS: 1.2 mmol/L (ref 0.0–2.0)
BICARBONATE: 24.6 mmol/L (ref 20.0–28.0)
FIO2: 40
LHR: 18 {breaths}/min
O2 Saturation: 98.4 %
PATIENT TEMPERATURE: 98.6
PCO2 ART: 35.2 mmHg (ref 32.0–48.0)
PEEP/CPAP: 5 cmH2O
PO2 ART: 118 mmHg — AB (ref 83.0–108.0)
VT: 420 mL
pH, Arterial: 7.46 — ABNORMAL HIGH (ref 7.350–7.450)

## 2016-09-07 LAB — CBC
HCT: 27.5 % — ABNORMAL LOW (ref 36.0–46.0)
HCT: 26.8 % — ABNORMAL LOW (ref 36.0–46.0)
Hemoglobin: 8.6 g/dL — ABNORMAL LOW (ref 12.0–15.0)
Hemoglobin: 8.4 g/dL — ABNORMAL LOW (ref 12.0–15.0)
MCH: 29.4 pg (ref 26.0–34.0)
MCH: 29.6 pg (ref 26.0–34.0)
MCHC: 31.3 g/dL (ref 30.0–36.0)
MCHC: 31.3 g/dL (ref 30.0–36.0)
MCV: 93.9 fL (ref 78.0–100.0)
MCV: 94.4 fL (ref 78.0–100.0)
Platelets: 337 10*3/uL (ref 150–400)
Platelets: 335 10*3/uL (ref 150–400)
RBC: 2.93 MIL/uL — ABNORMAL LOW (ref 3.87–5.11)
RBC: 2.84 MIL/uL — ABNORMAL LOW (ref 3.87–5.11)
RDW: 19.4 % — ABNORMAL HIGH (ref 11.5–15.5)
RDW: 19.6 % — ABNORMAL HIGH (ref 11.5–15.5)
WBC: 17.8 10*3/uL — ABNORMAL HIGH (ref 4.0–10.5)
WBC: 15.1 10*3/uL — ABNORMAL HIGH (ref 4.0–10.5)

## 2016-09-07 LAB — GLUCOSE, CAPILLARY
GLUCOSE-CAPILLARY: 147 mg/dL — AB (ref 65–99)
GLUCOSE-CAPILLARY: 187 mg/dL — AB (ref 65–99)
GLUCOSE-CAPILLARY: 228 mg/dL — AB (ref 65–99)
Glucose-Capillary: 98 mg/dL (ref 65–99)
Glucose-Capillary: 91 mg/dL (ref 65–99)
Glucose-Capillary: 174 mg/dL — ABNORMAL HIGH (ref 65–99)

## 2016-09-07 LAB — CULTURE, BLOOD (ROUTINE X 2)
CULTURE: NO GROWTH
Culture: NO GROWTH

## 2016-09-07 LAB — PHOSPHORUS: Phosphorus: 2.1 mg/dL — ABNORMAL LOW (ref 2.5–4.6)

## 2016-09-07 LAB — PREPARE FRESH FROZEN PLASMA: Unit division: 0

## 2016-09-07 LAB — PREPARE RBC (CROSSMATCH)

## 2016-09-07 LAB — HEPARIN LEVEL (UNFRACTIONATED): Heparin Unfractionated: 0.92 IU/mL — ABNORMAL HIGH (ref 0.30–0.70)

## 2016-09-07 LAB — POCT ACTIVATED CLOTTING TIME
ACTIVATED CLOTTING TIME: 213 s
ACTIVATED CLOTTING TIME: 213 s
Activated Clotting Time: 213 seconds

## 2016-09-07 LAB — MAGNESIUM: Magnesium: 2.5 mg/dL — ABNORMAL HIGH (ref 1.7–2.4)

## 2016-09-07 MED ORDER — AMIODARONE IV BOLUS ONLY 150 MG/100ML
150.0000 mg | Freq: Once | INTRAVENOUS | Status: AC
Start: 2016-09-07 — End: 2016-09-07
  Administered 2016-09-07: 150 mg via INTRAVENOUS

## 2016-09-07 MED ORDER — CLOPIDOGREL BISULFATE 75 MG PO TABS: 75.0000 mg | ORAL_TABLET | Freq: Every day | ORAL | Status: DC

## 2016-09-07 MED ORDER — FENTANYL CITRATE (PF) 100 MCG/2ML IJ SOLN
100.0000 ug | Freq: Once | INTRAMUSCULAR | Status: AC
  Administered 2016-09-07: 100 ug via INTRAVENOUS

## 2016-09-07 MED ORDER — ALBUMIN HUMAN 25 % IV SOLN
12.5000 g | Freq: Once | INTRAVENOUS | Status: AC
  Administered 2016-09-07: 12.5 g via INTRAVENOUS
  Filled 2016-09-07: qty 50

## 2016-09-07 MED ORDER — SODIUM PHOSPHATES 45 MMOLE/15ML IV SOLN
40.0000 meq | Freq: Once | INTRAVENOUS | Status: AC
  Administered 2016-09-07: 40 meq via INTRAVENOUS
  Filled 2016-09-07: qty 10

## 2016-09-07 MED ORDER — MIDAZOLAM HCL 2 MG/2ML IJ SOLN
2.0000 mg | Freq: Once | INTRAMUSCULAR | Status: AC
  Administered 2016-09-07: 2 mg via INTRAVENOUS

## 2016-09-07 NOTE — Progress Notes (Signed)
Spoke with Dr. Prescott Gum about co ox, gave verbal order to increase milrinone to 0.3, levo to 3, EKG and repeat co ox at 1400.  Rowe Pavy, RN

## 2016-09-07 NOTE — Procedures (Signed)
Bedside Tracheostomy Insertion Procedure Note   Patient Details:   Name: Deborah [name] DOB: November 15, 1956 MRN: [id number]  Procedure: Tracheostomy  Pre Procedure Assessment: ET Tube Size: 7.5 ET Tube secured at lip (cm): 22 Bite block in place: No Breath Sounds: Clear  Post Procedure Assessment: BP 120/83   Pulse 99   Temp 97.3 F (36.3 C) (Axillary)   Resp (!) 25   Ht 5\' 1"  (1.549 m)   Wt 143 lb 11.8 oz (65.2 kg)   SpO2 100%   BMI 27.16 kg/m  O2 sats: stable throughout Complications: No apparent complications Patient did tolerate procedure well Tracheostomy Brand:Shiley Tracheostomy Style:Cuffed Tracheostomy Size: 6.0 Tracheostomy Secured MZT:[id number] Tracheostomy Placement Confirmation:Trach cuff visualized and in place    [name] [facility] 09/07/2016, 12:43 PM

## 2016-09-07 NOTE — Procedures (Signed)
Percutaneous Tracheostomy Placement  Consent from family.  Patient sedated, paralyzed and position.  Placed on 100% FiO2 and RR matched.  Area cleaned and draped.  Lidocaine/epi injected.  Skin incision done followed by blunt dissection.  Trachea palpated then punctured, catheter passed and visualized bronchoscopically.  Wire placed and visualized.  Catheter removed.  Airway then entered and dilated.  Size 6 cuffed shiley trach placed and visualized bronchoscopically well above carina.  Good volume returns.  Patient tolerated the procedure well without complications.  Minimal blood loss.  CXR ordered and pending.  Wesam G. Yacoub, M.D. [facility] Pulmonary/Critical Care Medicine. Pager: [phone]. After hours pager: [phone].  

## 2016-09-07 NOTE — Progress Notes (Signed)
PULMONARY / CRITICAL CARE MEDICINE   Name: Deborah [name] MRN: [id number] DOB: 1955/12/12    ADMISSION DATE:  08/18/2016 CONSULTATION DATE:  08/18/2016  REFERRING MD:  Bensimhon   CHIEF COMPLAINT:  Ventilator management and critical care services   HISTORY OF PRESENT ILLNESS:   60 yo female admitted to [facility] 08/15/16 with STEMI s/p cath to [place].  Developed cardiogenic shock, VDRF (ETT 9/14), AKI with metabolic acidosis and started on CRRT 08/17/16.  Transferred to [facility] to tx shock and evaluated new severe MR from papillary muscle rupture.  She also had fever and found to have Group B Strep in sputum culture. Mitral valve replacement 9/20.  SUBJECTIVE:  No events overnight, much more alert and interactive, moving all ext to command.  VITAL SIGNS: BP 100/62 (BP Location: Right Arm)   Pulse (!) 102   Temp (!) 96.8 F (36 C) (Axillary)   Resp 14   Ht 5\' 1"  (1.549 m)   Wt 65.2 kg (143 lb 11.8 oz)   SpO2 100%   BMI 27.16 kg/m   HEMODYNAMICS: CVP:  [10 mmHg-21 mmHg] 16 mmHg  VENTILATOR SETTINGS: Vent Mode: PRVC FiO2 (%):  [40 %] 40 % Set Rate:  [18 bmp] 18 bmp Vt Set:  [420 mL] 420 mL PEEP:  [5 cmH20] 5 cmH20 Pressure Support:  [10 cmH20] 10 cmH20 Plateau Pressure:  [16 cmH20-20 cmH20] 19 cmH20  INTAKE / OUTPUT: I/O last 3 completed shifts: In: 3999.1 [I.V.:1559.1; Blood:312; NG/GT:970; IV [id number]:4696] Out: 2952 [Other:4391]  PHYSICAL EXAMINATION: General: acutely ill, sedate, moves spont. HENT: NCAT, PERRL, EOM-I and MMM PULM: few rhonchi bilaterally, decreased BS CV: RRR, systolic murmur noted GI: BS+, soft, nontender MSK: normal bulk and tone. Gr 2 edema Neuro: does not Follow commands  LABS:  BMET  Recent Labs Lab 09/06/16 0420 09/06/16 1605 09/07/16 0414  NA 135 136 135  K 3.5 3.4* 3.5  CL 102 101 99*  CO2 24 25 23   BUN 25* 19 19  CREATININE 1.30* 1.29* 1.29*  GLUCOSE 146* 149* 105*    Electrolytes  Recent Labs Lab 09/05/16 0405  09/06/16 0420  09/06/16 1605 09/07/16 0414  CALCIUM 8.5*  < > 8.7* 8.5* 8.7*  MG 2.6*  --  2.5*  --  2.5*  PHOS 2.1*  < > 2.1* 3.7 2.1*  < > = values in this interval not displayed.  CBC  Recent Labs Lab 09/05/16 1507 09/06/16 0420 09/07/16 0414  WBC 22.1* 22.3* 17.8*  HGB 8.8* 9.0* 8.6*  HCT 27.6* 28.2* 27.5*  PLT 324 332 337    Coag's  Recent Labs Lab 09/04/16 1130 09/06/16 1605  INR 1.92 1.84    Sepsis Markers  Recent Labs Lab 09/02/16 1107 09/03/16 0330 09/04/16 0415  PROCALCITON 4.63 16.37 33.02    ABG  Recent Labs Lab 09/05/16 0406 09/06/16 0425 09/07/16 0420  PHART 7.466* 7.446 7.460*  PCO2ART 35.7 37.4 35.2  PO2ART 120.0* 105.0 118*    Liver Enzymes  Recent Labs Lab 09/05/16 0405  09/06/16 0420 09/06/16 1605 09/07/16 0414  AST 66*  --  90*  --  148*  ALT 73*  --  86*  --  132*  ALKPHOS 190*  --  233*  --  230*  BILITOT 1.4*  --  2.2*  --  1.5*  ALBUMIN 3.1*  < > 3.5 3.4* 3.3*  < > = values in this interval not displayed.  Cardiac Enzymes No results for input(s): TROPONINI, PROBNP in the last 168 hours.  Glucose  Recent Labs Lab 09/06/16 1146 09/06/16 1548 09/06/16 2021 09/07/16 0014 09/07/16 0418 09/07/16 0823  GLUCAP 162* 145* 146* 147* 98 91    Imaging Dg Chest Port 1 View  Result Date: 09/07/2016 CLINICAL DATA:  Mitral valve replacement . EXAM: PORTABLE CHEST 1 VIEW COMPARISON:  09/05/2016 . FINDINGS: Endotracheal to, feeding tube, dual lumen left IJ catheter in stable position. Prior cardiac valve replacement. Cardiomegaly with mild bilateral from interstitial prominence right side greater than left suggesting congestive heart failure. Low lung volumes. IMPRESSION: 1.  Lines and tubes in stable position. 2. Prior cardiac valve replacement. Stable cardiomegaly. Mild increased interstitial markings noted bilaterally right side greater than left suggesting mild congestive heart failure . Electronically Signed   By: Marcello Moores  Register   On:  09/07/2016 07:55   Dg Abd Portable 1v  Result Date: 09/06/2016 CLINICAL DATA:  Feeding tube placement EXAM: PORTABLE ABDOMEN - 1 VIEW COMPARISON:  Earlier film of the same day FINDINGS: Feeding tube is been revised, tip now in the distal duodenum. Visualized bowel gas pattern unremarkable. Central venous catheters extend to the cavoatrial junction. Previous median sternotomy and MVR. IMPRESSION: 1. Feeding tube to distal duodenum. Electronically Signed   By: [name]   On: 09/06/2016 15:37   Dg Abd Portable 1v  Result Date: 09/06/2016 CLINICAL DATA:  Feeding tube placement EXAM: PORTABLE ABDOMEN - 1 VIEW COMPARISON:  Supine abdominal radiograph of September 03, 2016 FINDINGS: There is coiling of the radiodense tipped feeding tube in the distal gastric body with orientation of the tip back proximally toward the fundus. The tube does not appear cross the pylorus. The stomach is not abnormally distended. The bowel gas pattern elsewhere is relatively nonspecific and appears stable. IMPRESSION: Feeding tube tip lying in the distal gastric fundus with some coiling of the tube more distally at the pyloric region. Electronically Signed   By: David  Martinique M.D.   On: 09/06/2016 12:36    STUDIES:  LHC 9/12: PCI to OM1, severe MR, EF 55 to 60% RHC 9/15: RA 5, RV 37/3/6, PA 38/18/26, PCWP 15, CI 1.9, PVR 3.3 WU TEE 9/15: EF 60 to 65%, flail motion MV with severe MR RUQ ultrasound 9/23 > Gallbladder wall thickening noted, suggestion of sludge, small R effusion, liver unremarkable but left lobe not well visulalized  MICROBIOLOGY: MRSA PCR 9/12:  Negative MRSA PCR 9/15:  Negative Tracheal Asp Ctx 9/15:  Strep agalactiae and enterobacter. Blood Ctx x2 9/15:  Negative  MRSA PCR 9/19:  Negative  Blood culture 9/24: coag neg staph 2/4  ANTIBIOTICS: Zosyn 9/16 - 9/17 Vancomycin 9/16 - 9/17 Rocephin 9/17 >> 9/24 Vancomycin 9/20 (surgical prophylaxis) Vanc 9/25 > Zosyn 9/25 >  Fluconazole 9/25 >  10/5  SIGNIFICANT EVENTS: 9/12 Admit to [facility], PCI to [place] 9/14 ETT, CRRT 9/15 to [facility] 9/16 off insulin gtt 9/20 To OR for Mitral Valve Repair w/ bioprosthetic valve 9/28 To OR for permcath 9/29 Extubated. 9/30 Reintubated  LINES/TUBES: Right IJ PICC 9/12 - 9/20 Rt femoral introducer 9/15 - 9/20 OETT 8.0 9/14 >> 9/29, 9/30 >> HD cath left IJ 9/14 >> R IJ Introducer/CVL/Swan 9/20 >>  9/25 IABP 9/15 >>9/22 Lt femoral a line 9/15 >> 9/25 Y Chest Tube/Mediastinal Chest Tube 9/20 >>out PIV x1 L brachial art line 9/25 > 9/28 R IJ CVL > 9/28 L IJ Permcath >   ASSESSMENT / PLAN:  PULMONARY A: Acute Hypoxic Respiratory Failure - Secondary to acute pulmonary edema, reintubated 9/30 Aspiration  risk P:   Full vent support. Trach today. Continue to hold plavix for trach today, may restart in AM.  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 Post-op Mgt per CVTS/Cardiology. Titrate levo gtt down to 1 mcg, cont vaso drip. ASA, per cardiology/CVTS  Amio, Milrinone gtt's per cardiology/CVTS.  RENAL A:   Acute Renal Failure - Baseline creatinine 1.6-1.7  P:   Nephrology Following Keep even on CVVHD Monitor BMET and UOP Replace electrolytes as needed  GASTROINTESTINAL A:   Transaminitis - peaked 9/23, near complete resolution, unclear etiology,  Mild Malnutrition - Related to critical illness  P:   Pepcid IV q12hr Replaced NGT - cont TF  D/C fluconazole and recheck LFTs in AM.  HEMATOLOGIC A:   Anemia - Last transfusion 9/20 Leukocytosis - Multifactorial  P:  Trending cell counts w/ CBC SCDs  INFECTIOUS A:   Group B Strep Pneumonia Coag negative staph bacteremia (likely contaminant) Line changes on 9/28 Leucocytosis Enterobacter species P:   Vanc and zosyn and fluconazole restarted on 9/25  >  stopped 9/29 but resumed 9/30, high pct noted.  Re-culture for fever. D/C fluconazole  ENDOCRINE A:   DM - Glucose controlled  P:   Subcutaneous insulin per CVTS Levemir BID  NEUROLOGIC A:   New onset R sided hemiplegia and Preferental gaze to Right (9/25). Cranial CT scan was (-) for acute changes >> resolved without evidence of weakness on 9/29 Acute Encephalopathy - Multifactorial and likely combination toxic metabolic & hypoxia   P:   Follow neuro exam Fent and versed prn pain  Plan trach in 10/5 at 11 AM  The patient is critically ill with multiple organ systems failure and requires high complexity decision making for assessment and support, frequent evaluation and titration of therapies, application of advanced monitoring technologies and extensive interpretation of multiple databases.   Critical Care Time devoted to patient care services described in this note is  35  Minutes. This time reflects time of care of this signee Dr Jennet Maduro. This critical care time does not reflect procedure time, or teaching time or supervisory time of PA/NP/Med student/Med Resident etc but could involve care discussion time.  Rush Farmer, M.D. [facility] Pulmonary/Critical Care Medicine. Pager: [phone]. After hours pager: [phone].  09/07/2016, 8:43 AM

## 2016-09-07 NOTE — Progress Notes (Addendum)
7 Days Post-Op Procedure(s) (LRB): INSERTION OF DIALYSIS CATHETER LEFT INTERNAL JUGULAR VEIN & INSERTION OF TRIPLE LUMEN RIGHT INTERNAL JUGULAR VEIN (Bilateral) Subjective: Patient had successful percutaneous tracheostomy today, postop chest x-ray is satisfactory Patient back in atrial flutter with mixed venous saturation 50%-we'll attempt rapid atrial pacing for cardioversion Continue IV amiodarone Patient remains on renal replacement therapy White count continues to improve on imipenem, vancomycin, short course of Diflucan. Afebrile with negative cultures Patient more responsive and alert  Objective: Vital signs in last 24 hours: Temp:  [96.8 F (36 C)-98.1 F (36.7 C)] 97.3 F (36.3 C) (10/05 1240) Pulse Rate:  [99-124] 99 (10/05 1200) Cardiac Rhythm: Atrial fibrillation (10/05 0830) Resp:  [14-32] 25 (10/05 1200) BP: (90-144)/(62-91) 120/83 (10/05 1200) SpO2:  [100 %] 100 % (10/05 1200) Arterial Line BP: (69-186)/(49-89) 99/58 (10/05 1100) FiO2 (%):  [40 %] 40 % (10/05 1100) Weight:  [143 lb 11.8 oz (65.2 kg)] 143 lb 11.8 oz (65.2 kg) (10/05 0500)  Hemodynamic parameters for last 24 hours: CVP:  [10 mmHg-19 mmHg] 12 mmHg  Intake/Output from previous day: 10/04 0701 - 10/05 0700 In: 2660 [I.V.:930; Blood:312; NG/GT:460; IV Piggyback:958] Out: 2918  Intake/Output this shift: Total I/O In: 727.4 [I.V.:172.4; NG/GT:190; IV Piggyback:365] Out: 656 [Other:656]       Exam    General- alert and comfortable on ventilator   Lungs- clear without rales, wheezes   Cor- regular rate and rhythm, no murmur , gallop   Abdomen- soft, non-tender   Extremities - warm, non-tender, minimal edema   Neuro- oriented, appropriate, no focal weakness   Lab Results:  Recent Labs  09/06/16 0420 09/07/16 0414  WBC 22.3* 17.8*  HGB 9.0* 8.6*  HCT 28.2* 27.5*  PLT 332 337   BMET:  Recent Labs  09/06/16 1605 09/07/16 0414  NA 136 135  K 3.4* 3.5  CL 101 99*  CO2 25 23  GLUCOSE  149* 105*  BUN 19 19  CREATININE 1.29* 1.29*  CALCIUM 8.5* 8.7*    PT/INR:  Recent Labs  09/06/16 1605  LABPROT 21.5*  INR 1.84   ABG    Component Value Date/Time   PHART 7.460 (H) 09/07/2016 0420   HCO3 24.6 09/07/2016 0420   TCO2 27 09/06/2016 0425   ACIDBASEDEF 4.1 (H) 09/02/2016 1850   O2SAT 43.6 09/07/2016 1012   CBG (last 3)   Recent Labs  09/07/16 0418 09/07/16 0823 09/07/16 1237  GLUCAP 98 91 174*    Assessment/Plan: S/P Procedure(s) (LRB): INSERTION OF DIALYSIS CATHETER LEFT INTERNAL JUGULAR VEIN & INSERTION OF TRIPLE LUMEN RIGHT INTERNAL JUGULAR VEIN (Bilateral) Treat atrial flutter with rapid atrial pacing and increased amiodarone Physical therapy for mobilization now that she has had tracheostomy Resume Plavix tomorrow after percutaneous trach today  LOS: 20 days    Tharon Aquas Trigt III 09/07/2016

## 2016-09-07 NOTE — Procedures (Signed)
Bronchoscopy  for Percutaneous  Tracheostomy  Name: Deborah [name] MRN: [id number] DOB: 09/29/1956 Procedure: Bronchoscopy for Percutaneous Tracheostomy Indications: Diagnostic evaluation of the airways In conjunction with: Dr. Nelda Marseille  Procedure Details Consent: Risks of procedure as well as the alternatives and risks of each were explained to the (patient/caregiver).  Consent for procedure obtained. Time Out: Verified patient identification, verified procedure, site/side was marked, verified correct patient position, special equipment/implants available, medications/allergies/relevent history reviewed, required imaging and test results available.  Performed  In preparation for procedure, patient was given 100% FiO2 and bronchoscope lubricated. Sedation: Benzodiazepines, Muscle relaxants and Etomidate  Airway entered and the following bronchi were examined:trachea to carina  Procedures performed: Endotracheal Tube retracted in 2 cm increments. Cannulation of airway observed. Dilation observed. Placement of trachel tube  observed . No overt complications. Bronchoscope removed.    Evaluation Hemodynamic Status: Transient hypotension treated with pressors; O2 sats: stable throughout Patient's Current Condition: stable Specimens:  None Complications: No apparent complications Patient did tolerate procedure well.   Richardson Landry Minor ACNP Maryanna Shape PCCM Pager [phone] till 3 pm If no answer page [phone] 09/07/2016, 12:28 PM   tolerated No post wall injury ett backed out and direct visualized procedure  Lavon Paganini. Titus Mould, MD, [place] Pgr: [place] [facility]

## 2016-09-07 NOTE — Progress Notes (Signed)
Patient ID: Deborah [name], female   DOB: 1956-07-15, 60 y.o.   MRN: [id number]   60 y/o woman with DM2, HTN, HL and noncompliance presented to [facility] ER with infrerior posterior STEMI on 9/12. Taken to cath lab and found to have occluded small to moderate OM-1 branch. Underwent successful PCS with stent x 2 by Dr. Clayborn Bigness. Coronaries otherwise ok. EF 55-60% with severe MR confirmed by echo.   Post-cath developed respiratory failure and required intubation. Developed shock and renal failure with peak creatinine 3.4 (1.6 on admit). Trialysis catheter placed.   Patient persistently acidotic with concern for ischemic MR and transferred here. En route develop severe hypotension and norepinephrine started.   Brought to cath lab 9/15 and TEE showed normal LV (EF 60-65%) and normal RV function with ruptured posterior papillary muscle with severe posterior MR.  Underwent placement of Swan and IABP.   Treated for group B Strep PNA.   S/P MVR 08/23/16.  IABP pulled 9/22.   Echo 9/23  RV and LV are normal (EF 60%) but appeared underfilled   Extubated 9/29.  However, picture consistent with septic shock on 9/30 so pressors started and she was re-intubated.  S/P TEE DC-CV 09/04/16    Weaning pressors. Remains on milrinone 0.25 . Off norepi and vasopressin.  Blood cultures no growth today. Respiratory aspirate- enterobacter. Todays CO-OX 46%.  CVVH but not pulling fluid.    Scheduled Meds: . atorvastatin  20 mg Oral q1800  . chlorhexidine gluconate (MEDLINE KIT)  15 mL Mouth Rinse BID  . darbepoetin (ARANESP) injection - NON-DIALYSIS  100 mcg Subcutaneous Q Mon-1800  . etomidate  40 mg Intravenous Once  . famotidine  20 mg Oral Daily  . feeding supplement (NEPRO CARB STEADY)  1,000 mL Per Tube Q24H  . feeding supplement (PRO-STAT SUGAR FREE 64)  30 mL Per Tube 5 X Daily  . fentaNYL (SUBLIMAZE) injection  200 mcg Intravenous Once  . fluconazole (DIFLUCAN) IV  100 mg Intravenous Q24H  . Gerhardt's  butt cream   Topical BID  . imipenem-cilastatin  500 mg Intravenous Q8H  . insulin aspart  0-24 Units Subcutaneous Q4H  . insulin detemir  10 Units Subcutaneous BID  . mouth rinse  15 mL Mouth Rinse 10 times per day  . midazolam  4 mg Intravenous Once  . midodrine  5 mg Oral TID WC  . propofol  5-80 mcg/kg/min Intravenous Once  . sodium chloride flush  3 mL Intravenous Q12H  . sodium chloride flush  3 mL Intravenous Q12H  . sodium phosphate  Dextrose 5% IVPB  40 mEq Intravenous Once  . vancomycin  750 mg Intravenous Q24H  . vecuronium  10 mg Intravenous Once   Continuous Infusions: . sodium chloride    . sodium chloride 10 mL/hr at 09/06/16 2000  . sodium chloride    . amiodarone 30 mg/hr (09/07/16 0309)  . heparin 10,000 units/ 20 mL infusion syringe 1,450 Units/hr (09/07/16 0543)  . milrinone 0.25 mcg/kg/min (09/07/16 0309)  . norepinephrine (LEVOPHED) Adult infusion 1 mcg/min (09/07/16 0200)  . dialysis replacement fluid (prismasate) 600 mL/hr at 09/07/16 0206  . dialysis replacement fluid (prismasate) 200 mL/hr at 09/06/16 2225  . dialysate (PRISMASATE) 1,000 mL/hr at 09/07/16 0815  . vasopressin (PITRESSIN) infusion - *FOR SHOCK* Stopped (09/07/16 0800)   PRN Meds:.Place/Maintain arterial line **AND** sodium chloride, fentaNYL (SUBLIMAZE) injection, heparin, HYDROcodone-acetaminophen, midazolam, ondansetron (ZOFRAN) IV, sodium chloride, sodium chloride flush, sodium chloride flush    Vitals:  09/07/16 0800 09/07/16 0827 09/07/16 0830 09/07/16 0900  BP: 100/62  100/62 109/72  Pulse: (!) 102  (!) 102 (!) 103  Resp: 14  (!) 22 (!) 21  Temp:  (!) 96.8 F (36 C)    TempSrc:  Axillary    SpO2: 100%  100% 100%  Weight:      Height:        Intake/Output Summary (Last 24 hours) at 09/07/16 0953 Last data filed at 09/07/16 0900  Gross per 24 hour  Intake           2629.4 ml  Output             2850 ml  Net           -220.6 ml    LABS: Basic Metabolic Panel:  Recent  Labs  09/06/16 0420 09/06/16 1605 09/07/16 0414  NA 135 136 135  K 3.5 3.4* 3.5  CL 102 101 99*  CO2 24 25 23   GLUCOSE 146* 149* 105*  BUN 25* 19 19  CREATININE 1.30* 1.29* 1.29*  CALCIUM 8.7* 8.5* 8.7*  MG 2.5*  --  2.5*  PHOS 2.1* 3.7 2.1*   Liver Function Tests:  Recent Labs  09/06/16 0420 09/06/16 1605 09/07/16 0414  AST 90*  --  148*  ALT 86*  --  132*  ALKPHOS 233*  --  230*  BILITOT 2.2*  --  1.5*  PROT 7.3  --  7.0  ALBUMIN 3.5 3.4* 3.3*   No results for input(s): LIPASE, AMYLASE in the last 72 hours. CBC:  Recent Labs  09/06/16 0420 09/07/16 0414  WBC 22.3* 17.8*  HGB 9.0* 8.6*  HCT 28.2* 27.5*  MCV 94.0 93.9  PLT 332 337   Cardiac Enzymes: No results for input(s): CKTOTAL, CKMB, CKMBINDEX, TROPONINI in the last 72 hours. BNP: Invalid input(s): POCBNP D-Dimer: No results for input(s): DDIMER in the last 72 hours. Hemoglobin A1C: No results for input(s): HGBA1C in the last 72 hours. Fasting Lipid Panel: No results for input(s): CHOL, HDL, LDLCALC, TRIG, CHOLHDL, LDLDIRECT in the last 72 hours. Thyroid Function Tests: No results for input(s): TSH, T4TOTAL, T3FREE, THYROIDAB in the last 72 hours.  Invalid input(s): FREET3 Anemia Panel: No results for input(s): VITAMINB12, FOLATE, FERRITIN, TIBC, IRON, RETICCTPCT in the last 72 hours.  PHYSICAL EXAM CVP 15 General: Intubated/awake Neck: RIJ TLC LIJ trialysis  Lungs: Clear anteriorly CV: Nondisplaced PMI.  Heart tachy, regular S1/S2, no S3/S4, 1/6 SEM USB.   Abdomen: Soft, ND, no HSM. No bruits or masses. +BS  Neurologic: Awake.   Extremities: No clubbing or cyanosis. R and LLE SCDs.   TELEMETRY: NSR 90-100s .   Assessment:   1. Cardiogenic shock: Due to acute severe MR. EF 70% on 9/25 echo.  2. CAD: Inferior posterior STEMI on 9/13 with DES x 2 to OM-1      --Plavix restarted on 9/22 3. Severe ischemic mitral regurgitation due to ruptured posterior papillary muscle in setting of  inferoposterior STEMI.     --s/p MVR 08/23/2016  4. AKI on CKD stage III - due to ATN likely from cardiogenic shock + contrast with cath. Has required CVVH.  5. Acute respiratory failure: Pulmonary edema.  6. DM2 7. Suspect component of septic shock: Possible PNA, group B Strep in sputum culture.  8. Anemia 9. PVCs   10. Shock liver 11. MRSA bacteremia 12. Atrial fibrillation: Paroxysmal.  On amiodarone.     Plan/Discussion:   Remains intubated. Planning for  trach today.   Septic shock,  WBC down to 17.8 . Respiratory culture - Enterobacter. Started on imipenem. Remains on vancomycin.    Remains on milrinone 0.25 mcg. Off noepi and vasopressin.  Co-ox down to 46% may need to increase milrinone to 0.375 mcg.  No bb with low output.   On CVVH remaining even.    S/P TEE DC-CV on 10/3 . Continue  amiodarone gtt . Maintaining NSR.   Hemoglobin up to 8.6     Deborah Clegg, NP-C  09/07/2016  Advanced Heart Failure Team Pager [phone] (M-F; 7a - 4p)  Please contact [facility] for night-coverage after hours (4p -7a ) and weekends on [email or web address]  Patient seen and examined with Deborah Grinder, NP. We discussed all aspects of the encounter. I agree with the assessment and plan as stated above.   She is now s/p trach today. Sputum cx + for resistant enterobacter and now on imipenem. Back in AFL today despite IV amio. Ma need repeat DC-CV. Co-ox back down. I am reluctant to increase milrinone with recurrent AFL but may have to depending on her course. Remains on CVVHD. Keeping even. May be worth making her a little negative.   Critical Care Time devoted to patient care services described in this note is 35 Minutes.  Deborah Vitelli,MD 10:05 PM

## 2016-09-07 NOTE — Progress Notes (Signed)
      [address]       [place] [address]             [phone]      On vent but alert  BP 99/69   Pulse 81   Temp 97.4 F (36.3 C) (Axillary)   Resp (!) 22   Ht 5\' 1"  (1.549 m)   Wt 143 lb 11.8 oz (65.2 kg)   SpO2 100%   BMI 27.16 kg/m    Intake/Output Summary (Last 24 hours) at 09/07/16 1942 Last data filed at 09/07/16 1900  Gross per 24 hour  Intake          2703.06 ml  Output             2582 ml  Net           121.06 ml    Some bleeding around trach tube.   Hct= [place] Roxan Hockey, MD [facility] [phone]

## 2016-09-07 NOTE — Progress Notes (Signed)
Subjective:   Remains critically ill overnight-pressor decreasing some  CRRT running well - plan is for trach today - no UOP Objective Vital signs in last 24 hours: Vitals:   09/07/16 0615 09/07/16 0630 09/07/16 0645 09/07/16 0700  BP:    109/72  Pulse: (!) 103 (!) 102 (!) 103 (!) 102  Resp: (!) 27 19 20 19   Temp:      TempSrc:      SpO2: 100% 100% 100% 100%  Weight:      Height:       Weight change: -0.5 kg (-1 lb 1.6 oz)  Intake/Output Summary (Last 24 hours) at 09/07/16 0731 Last data filed at 09/07/16 0700  Gross per 24 hour  Intake          2659.98 ml  Output             2918 ml  Net          -258.02 ml    Assessment/ Plan: Pt is a 60 y.o. yo female who was admitted on 08/18/2016 with STEMI- then severe MR- s/p stenting and eventually MVR - clinical decline of late with reintubation- possible PNA Assessment/Plan: 1. Renal- A on CRF- essentially on CRRT since 9/14- most recent dialysis catheter in since 9/28- still unstable requiring pressors.  Given this prolonged AKI - HD dep for nearly 3  weeks would think renal recovery is becoming less likely as time passes.  Will be able to keep CRRT going while getting trached  2. HTN/vol-  Running even with CRRT- peripheral edema with low alb- CVP in the low teens -CTS giving albumin - OK to give separate from CRRT  3. Anemia- this AM in the 8's- added ESA- transfuse as needed per primary (one unit 10/3) 4. Secondary hyperparathyroidism- s/p phos repletion- needs again-  no binders- K is stable  5. CV- per CTS- s/p MVR - atrial arrythmias- on pressors and midodrine- s/p cardioversion 10/2 6. ID- high WBC on vanc /zosyn - coming down   Bliss Tsang A    Labs: Basic Metabolic Panel:  Recent Labs Lab 09/06/16 0420 09/06/16 1605 09/07/16 0414  NA 135 136 135  K 3.5 3.4* 3.5  CL 102 101 99*  CO2 24 25 23   GLUCOSE 146* 149* 105*  BUN 25* 19 19  CREATININE 1.30* 1.29* 1.29*  CALCIUM 8.7* 8.5* 8.7*  PHOS 2.1* 3.7 2.1*    Liver Function Tests:  Recent Labs Lab 09/05/16 0405  09/06/16 0420 09/06/16 1605 09/07/16 0414  AST 66*  --  90*  --  148*  ALT 73*  --  86*  --  132*  ALKPHOS 190*  --  233*  --  230*  BILITOT 1.4*  --  2.2*  --  1.5*  PROT 6.6  --  7.3  --  7.0  ALBUMIN 3.1*  < > 3.5 3.4* 3.3*  < > = values in this interval not displayed. No results for input(s): LIPASE, AMYLASE in the last 168 hours.  Recent Labs Lab 09/02/16 1752  AMMONIA 23   CBC:  Recent Labs Lab 09/04/16 0415 09/05/16 0405 09/05/16 1507 09/06/16 0420 09/07/16 0414  WBC 24.0* 23.5* 22.1* 22.3* 17.8*  HGB 8.3* 7.7* 8.8* 9.0* 8.6*  HCT 26.0* 24.3* 27.6* 28.2* 27.5*  MCV 91.5 93.5 92.6 94.0 93.9  PLT 325 306 324 332 337   Cardiac Enzymes: No results for input(s): CKTOTAL, CKMB, CKMBINDEX, TROPONINI in the last 168 hours. CBG:  Recent Labs Lab 09/06/16 1146  09/06/16 1548 09/06/16 2021 09/07/16 0014 09/07/16 0418  GLUCAP 162* 145* 146* 147* 98    Iron Studies: No results for input(s): IRON, TIBC, TRANSFERRIN, FERRITIN in the last 72 hours. Studies/Results: Dg Abd Portable 1v  Result Date: 09/06/2016 CLINICAL DATA:  Feeding tube placement EXAM: PORTABLE ABDOMEN - 1 VIEW COMPARISON:  Earlier film of the same day FINDINGS: Feeding tube is been revised, tip now in the distal duodenum. Visualized bowel gas pattern unremarkable. Central venous catheters extend to the cavoatrial junction. Previous median sternotomy and MVR. IMPRESSION: 1. Feeding tube to distal duodenum. Electronically Signed   By: [name]   On: 09/06/2016 15:37   Dg Abd Portable 1v  Result Date: 09/06/2016 CLINICAL DATA:  Feeding tube placement EXAM: PORTABLE ABDOMEN - 1 VIEW COMPARISON:  Supine abdominal radiograph of September 03, 2016 FINDINGS: There is coiling of the radiodense tipped feeding tube in the distal gastric body with orientation of the tip back proximally toward the fundus. The tube does not appear cross the pylorus. The  stomach is not abnormally distended. The bowel gas pattern elsewhere is relatively nonspecific and appears stable. IMPRESSION: Feeding tube tip lying in the distal gastric fundus with some coiling of the tube more distally at the pyloric region. Electronically Signed   By: David  Martinique M.D.   On: 09/06/2016 12:36   Medications: Infusions: . sodium chloride    . sodium chloride 10 mL/hr at 09/06/16 2000  . sodium chloride    . amiodarone 30 mg/hr (09/07/16 0309)  . heparin 10,000 units/ 20 mL infusion syringe 1,450 Units/hr (09/07/16 0543)  . milrinone 0.25 mcg/kg/min (09/07/16 0309)  . norepinephrine (LEVOPHED) Adult infusion 1 mcg/min (09/07/16 0200)  . dialysis replacement fluid (prismasate) 600 mL/hr at 09/07/16 0206  . dialysis replacement fluid (prismasate) 200 mL/hr at 09/06/16 2225  . dialysate (PRISMASATE) 1,000 mL/hr at 09/07/16 0309  . vasopressin (PITRESSIN) infusion - *FOR SHOCK* 0.005 Units/min (09/06/16 1708)    Scheduled Medications: . atorvastatin  20 mg Oral q1800  . chlorhexidine gluconate (MEDLINE KIT)  15 mL Mouth Rinse BID  . darbepoetin (ARANESP) injection - NON-DIALYSIS  100 mcg Subcutaneous Q Mon-1800  . etomidate  40 mg Intravenous Once  . famotidine  20 mg Oral Daily  . feeding supplement (NEPRO CARB STEADY)  1,000 mL Per Tube Q24H  . feeding supplement (PRO-STAT SUGAR FREE 64)  30 mL Per Tube 5 X Daily  . fentaNYL (SUBLIMAZE) injection  200 mcg Intravenous Once  . fluconazole (DIFLUCAN) IV  100 mg Intravenous Q24H  . Gerhardt's butt cream   Topical BID  . imipenem-cilastatin  500 mg Intravenous Q8H  . insulin aspart  0-24 Units Subcutaneous Q4H  . insulin detemir  10 Units Subcutaneous BID  . mouth rinse  15 mL Mouth Rinse 10 times per day  . midazolam  4 mg Intravenous Once  . midodrine  5 mg Oral TID WC  . propofol  5-80 mcg/kg/min Intravenous Once  . sodium chloride flush  3 mL Intravenous Q12H  . sodium chloride flush  3 mL Intravenous Q12H  .  vancomycin  750 mg Intravenous Q24H  . vecuronium  10 mg Intravenous Once    have reviewed scheduled and prn medications.  Physical Exam: General: alert- responding to questions Heart: tachy Lungs: CBS bilat Abdomen: soft, non tender Extremities: pitting edema Dialysis Access: left IJ vascath placed 9/28    09/07/2016,7:31 AM  LOS: 20 days

## 2016-09-08 ENCOUNTER — Inpatient Hospital Stay (HOSPITAL_COMMUNITY): Payer: Medicaid Other

## 2016-09-08 DIAGNOSIS — Z930 Tracheostomy status: Secondary | ICD-10-CM

## 2016-09-08 LAB — COMPREHENSIVE METABOLIC PANEL
ALT: 152 U/L — ABNORMAL HIGH (ref 14–54)
AST: 155 U/L — ABNORMAL HIGH (ref 15–41)
Albumin: 3.2 g/dL — ABNORMAL LOW (ref 3.5–5.0)
Alkaline Phosphatase: 197 U/L — ABNORMAL HIGH (ref 38–126)
Anion gap: 13 (ref 5–15)
BUN: 22 mg/dL — ABNORMAL HIGH (ref 6–20)
CO2: 24 mmol/L (ref 22–32)
Calcium: 9.1 mg/dL (ref 8.9–10.3)
Chloride: 98 mmol/L — ABNORMAL LOW (ref 101–111)
Creatinine, Ser: 1.2 mg/dL — ABNORMAL HIGH (ref 0.44–1.00)
GFR calc Af Amer: 56 mL/min — ABNORMAL LOW (ref >60)
GFR calc non Af Amer: 48 mL/min — ABNORMAL LOW (ref >60)
Glucose, Bld: 98 mg/dL (ref 65–99)
Potassium: 3.4 mmol/L — ABNORMAL LOW (ref 3.5–5.1)
Sodium: 135 mmol/L (ref 135–145)
Total Bilirubin: 2.1 mg/dL — ABNORMAL HIGH (ref 0.3–1.2)
Total Protein: 7.1 g/dL (ref 6.5–8.1)

## 2016-09-08 LAB — CBC
HCT: 30.2 % — ABNORMAL LOW (ref 36.0–46.0)
HCT: 28.6 % — ABNORMAL LOW (ref 36.0–46.0)
Hemoglobin: 9.6 g/dL — ABNORMAL LOW (ref 12.0–15.0)
Hemoglobin: 9.1 g/dL — ABNORMAL LOW (ref 12.0–15.0)
MCH: 29.4 pg (ref 26.0–34.0)
MCH: 29.5 pg (ref 26.0–34.0)
MCHC: 31.8 g/dL (ref 30.0–36.0)
MCHC: 31.8 g/dL (ref 30.0–36.0)
MCV: 92.6 fL (ref 78.0–100.0)
MCV: 92.9 fL (ref 78.0–100.0)
Platelets: 344 10*3/uL (ref 150–400)
Platelets: 317 10*3/uL (ref 150–400)
RBC: 3.26 MIL/uL — ABNORMAL LOW (ref 3.87–5.11)
RBC: 3.08 MIL/uL — ABNORMAL LOW (ref 3.87–5.11)
RDW: 18.8 % — ABNORMAL HIGH (ref 11.5–15.5)
RDW: 19.5 % — ABNORMAL HIGH (ref 11.5–15.5)
WBC: 16.4 10*3/uL — ABNORMAL HIGH (ref 4.0–10.5)
WBC: 17.9 10*3/uL — ABNORMAL HIGH (ref 4.0–10.5)

## 2016-09-08 LAB — RENAL FUNCTION PANEL
Albumin: 3.1 g/dL — ABNORMAL LOW (ref 3.5–5.0)
Anion gap: 12 (ref 5–15)
BUN: 23 mg/dL — AB (ref 6–20)
CHLORIDE: 100 mmol/L — AB (ref 101–111)
CO2: 23 mmol/L (ref 22–32)
Calcium: 8.8 mg/dL — ABNORMAL LOW (ref 8.9–10.3)
Creatinine, Ser: 1.22 mg/dL — ABNORMAL HIGH (ref 0.44–1.00)
GFR, EST AFRICAN AMERICAN: 55 mL/min — AB (ref >60)
GFR, EST NON AFRICAN AMERICAN: 48 mL/min — AB (ref >60)
Glucose, Bld: 70 mg/dL (ref 65–99)
POTASSIUM: 3.4 mmol/L — AB (ref 3.5–5.1)
Phosphorus: 4 mg/dL (ref 2.5–4.6)
Sodium: 135 mmol/L (ref 135–145)

## 2016-09-08 LAB — TYPE AND SCREEN
ABO/RH(D): AB POS
Antibody Screen: NEGATIVE
Unit division: 0
Unit division: 0

## 2016-09-08 LAB — GLUCOSE, CAPILLARY
GLUCOSE-CAPILLARY: 161 mg/dL — AB (ref 65–99)
GLUCOSE-CAPILLARY: 181 mg/dL — AB (ref 65–99)
GLUCOSE-CAPILLARY: 245 mg/dL — AB (ref 65–99)
GLUCOSE-CAPILLARY: 70 mg/dL (ref 65–99)
Glucose-Capillary: 98 mg/dL (ref 65–99)
Glucose-Capillary: 150 mg/dL — ABNORMAL HIGH (ref 65–99)

## 2016-09-08 LAB — POCT I-STAT 3, ART BLOOD GAS (G3+)
ACID-BASE EXCESS: 3 mmol/L — AB (ref 0.0–2.0)
Bicarbonate: 25.2 mmol/L (ref 20.0–28.0)
O2 SAT: 99 %
PO2 ART: 120 mmHg — AB (ref 83.0–108.0)
TCO2: 26 mmol/L (ref 0–100)
pCO2 arterial: 29.4 mmHg — ABNORMAL LOW (ref 32.0–48.0)
pH, Arterial: 7.541 — ABNORMAL HIGH (ref 7.350–7.450)

## 2016-09-08 LAB — POCT ACTIVATED CLOTTING TIME
ACTIVATED CLOTTING TIME: 202 s
ACTIVATED CLOTTING TIME: 213 s
ACTIVATED CLOTTING TIME: 213 s
ACTIVATED CLOTTING TIME: 213 s
ACTIVATED CLOTTING TIME: 219 s
Activated Clotting Time: 197 seconds

## 2016-09-08 LAB — PHOSPHORUS: Phosphorus: 2.2 mg/dL — ABNORMAL LOW (ref 2.5–4.6)

## 2016-09-08 LAB — CARBOXYHEMOGLOBIN
CARBOXYHEMOGLOBIN: 1.4 % (ref 0.5–1.5)
METHEMOGLOBIN: 1.2 % (ref 0.0–1.5)
O2 Saturation: 72.9 %
TOTAL HEMOGLOBIN: 8.3 g/dL — AB (ref 12.0–16.0)

## 2016-09-08 LAB — MAGNESIUM: MAGNESIUM: 2.5 mg/dL — AB (ref 1.7–2.4)

## 2016-09-08 LAB — HEPARIN LEVEL (UNFRACTIONATED): Heparin Unfractionated: 1.42 IU/mL — ABNORMAL HIGH (ref 0.30–0.70)

## 2016-09-08 MED ORDER — SODIUM PHOSPHATES 45 MMOLE/15ML IV SOLN
40.0000 meq | Freq: Once | INTRAVENOUS | Status: AC
  Administered 2016-09-08: 40 meq via INTRAVENOUS
  Filled 2016-09-08: qty 10

## 2016-09-08 MED ORDER — SODIUM CHLORIDE 0.9% FLUSH
10.0000 mL | Freq: Two times a day (BID) | INTRAVENOUS | Status: DC
  Administered 2016-09-08 – 2016-09-10 (×5): 10 mL

## 2016-09-08 MED ORDER — SODIUM CHLORIDE 0.9% FLUSH
10.0000 mL | INTRAVENOUS | Status: DC | PRN
Start: 2016-09-08 — End: 2016-09-10

## 2016-09-08 MED ORDER — SODIUM CHLORIDE 0.9 % IV SOLN
20.0000 ug | Freq: Once | INTRAVENOUS | Status: AC
  Administered 2016-09-08: 20 ug via INTRAVENOUS
  Filled 2016-09-08: qty 5

## 2016-09-08 MED ORDER — MILRINONE LACTATE IN DEXTROSE 20-5 MG/100ML-% IV SOLN: 0.1250 ug/kg/min | INTRAVENOUS | Status: DC

## 2016-09-08 NOTE — Progress Notes (Signed)
Entered in error

## 2016-09-08 NOTE — Evaluation (Signed)
Physical Therapy Evaluation Patient Details Name: Deborah [name] MRN: [id number] DOB: [birth year] Today's Date: 09/08/2016   History of Present Illness  60 yo admitted to [facility] 9/12 with STEMI develped cardiogenic shock with ETT and cRRT initiated on 9/14 after cath, transfer to [facility] 8/41, metabolic acidosis, extubated 9/29, reintubated 9/30, 9/25-9/29 hemiplegia developed and resolved, trach and bronchoscopy 10/5. PMHx: DM, HTN, HLD  Clinical Impression  Pt nodding head that she is eager to get OOB. Pt with prolonged bedrest of 22 days. Pt with generalized weakness due to bedrest and medical complications as well as impaired balance, decreased transfers, function and gait who will benefit from acute therapy to maximize mobility, function and independence. Pt today required P.T. Plus additional 3 person assist and RT to manage CRRT, vent, lines, equipment and pt. Pt encouraged to continue bil LE movement and exercise throughout the day and nursing aware of mobility sequence. Pt educated for sternal precautions and required max cues not to push for assist with balance EOB. Pt sat EOB with mod assist for trunk control x 7 min.   HR 92 sats 100% on PSV FiO2 40% at initiation of session with RT transition to FiO2 at 100% during transfer to chair    Follow Up Recommendations LTACH;Supervision/Assistance - 24 hour    Equipment Recommendations  Other (comment) (TBD with progression)    Recommendations for Other Services OT consult;Speech consult     Precautions / Restrictions Precautions Precautions: Sternal;Fall Precaution Comments: trach, vent, CRRT, flexiseal      Mobility  Bed Mobility Overal bed mobility: Needs Assistance Bed Mobility: Rolling;Sidelying to Sit Rolling: Max assist Sidelying to sit: Max assist       General bed mobility comments: cues for sequence and hand placement with assist to rotate pelvis, bring legs off of bed and elevate trunk. +3 additional assist to manage  lines and equipment  Transfers Overall transfer level: Needs assistance Equipment used: None Transfers: Sit to/from [employer] Transfers Sit to Stand: Max assist Stand pivot transfers: Max assist       General transfer comment: pt stood from bed x 3 and chair x 1 with max assist at pad to elevate sacrum and rise from surface. Cues for anterior translation, hand placement and safety. +3 assist for lines. 2 person total assist to scoot back in chair.  Ambulation/Gait                Stairs            Wheelchair Mobility    Modified Rankin (Stroke Patients Only)       Balance Overall balance assessment: Needs assistance   Sitting balance-Leahy Scale: Poor       Standing balance-Leahy Scale: Zero                               Pertinent Vitals/Pain Pain Assessment: No/denies pain    Home Living Family/patient expects to be discharged to:: Private residence   Available Help at Discharge: Family;Available PRN/intermittently Type of Home: House           Additional Comments: pt on vent and trach and unable to provide all PLOF and home environment at this time.     Prior Function Level of Independence: Independent               Hand Dominance        Extremity/Trunk Assessment   Upper Extremity Assessment: Generalized weakness  Lower Extremity Assessment: RLE deficits/detail;LLE deficits/detail RLE Deficits / Details: grossly 2+/5 with decreased dorsiflexion bil LE LLE Deficits / Details: grossly 2+/5 with decreased dorsiflexion bil LE  Cervical / Trunk Assessment: Kyphotic  Communication   Communication: Tracheostomy  Cognition Arousal/Alertness: Awake/alert Behavior During Therapy: WFL for tasks assessed/performed Overall Cognitive Status: Difficult to assess                      General Comments      Exercises General Exercises - Lower Extremity Ankle Circles/Pumps: AAROM;Both;5  reps;Supine Heel Slides: AAROM;Both;10 reps;Seated   Assessment/Plan    PT Assessment Patient needs continued PT services  PT Problem List Decreased strength;Decreased mobility;Decreased range of motion;Decreased knowledge of precautions;Decreased activity tolerance;Decreased cognition;Cardiopulmonary status limiting activity;Decreased skin integrity;Decreased balance;Decreased knowledge of use of DME          PT Treatment Interventions DME instruction;Therapeutic activities;Therapeutic exercise;Patient/family education;Balance training;Functional mobility training;Neuromuscular re-education;Gait training    PT Goals (Current goals can be found in the Care Plan section)  Acute Rehab PT Goals Patient Stated Goal: pt unable to state agreeable to return to walking and caring for herself PT Goal Formulation: Patient unable to participate in goal setting Time For Goal Achievement: 09/22/16 Potential to Achieve Goals: Fair    Frequency Min 4X/week   Barriers to discharge Decreased caregiver support      Co-evaluation               End of Session Equipment Utilized During Treatment: Gait belt;Other (comment) (vent) Activity Tolerance: Patient tolerated treatment well Patient left: in chair;with call bell/phone within reach;with chair alarm set;with nursing/sitter in room Nurse Communication: Mobility status;Precautions         Time: 0076-2263 PT Time Calculation (min) (ACUTE ONLY): 26 min   Charges:   PT Evaluation $PT Eval High Complexity: 1 Procedure     PT G CodesMelford Aase 09/08/2016, 4:09 PM Elwyn Reach, [place]

## 2016-09-08 NOTE — Progress Notes (Signed)
Heparin syringe removed per verbal order from Dr. Prescott Gum due to oozing at trach site. Order carried out. ACT 197 at 0800. No further checks needed at this time. Will continue to monitor pt closely.

## 2016-09-08 NOTE — Progress Notes (Signed)
PULMONARY / CRITICAL CARE MEDICINE   Name: Deborah [name] MRN: [id number] DOB: 1956-05-18    ADMISSION DATE:  08/18/2016 CONSULTATION DATE:  08/18/2016  REFERRING MD:  Bensimhon   CHIEF COMPLAINT:  Ventilator management and critical care services   HISTORY OF PRESENT ILLNESS:   60 yo female admitted to [facility] 08/15/16 with STEMI s/p cath to [place].  Developed cardiogenic shock, VDRF (ETT 9/14), AKI with metabolic acidosis and started on CRRT 08/17/16.  Transferred to [facility] to tx shock and evaluated new severe MR from papillary muscle rupture.  She also had fever and found to have Group B Strep in sputum culture. Mitral valve replacement 9/20.  SUBJECTIVE:  Some trach bleeding overnight, resolved  VITAL SIGNS: BP (!) 115/56   Pulse (!) 102   Temp 98.4 F (36.9 C) (Oral)   Resp (!) 22   Ht 5\' 1"  (1.549 m)   Wt 63.1 kg (139 lb 1.8 oz)   SpO2 100%   BMI 26.28 kg/m   HEMODYNAMICS: CVP:  [8 mmHg-56 mmHg] 12 mmHg  VENTILATOR SETTINGS: Vent Mode: CPAP;PSV FiO2 (%):  [40 %-100 %] 40 % Set Rate:  [18 bmp-24 bmp] 24 bmp Vt Set:  [420 mL] 420 mL PEEP:  [5 cmH20] 5 cmH20 Pressure Support:  [10 cmH20] 10 cmH20 Plateau Pressure:  [15 cmH20-24 cmH20] 15 cmH20  INTAKE / OUTPUT: I/O last 3 completed shifts: In: 4036.3 [I.V.:1331.3; Blood:647; NG/GT:1100; IV Piggyback:958] Out: 0102 [Other:3925; Stool:150]  PHYSICAL EXAMINATION: General: acutely ill, sedate, moves spont. HENT: Trach in place, /AT, PERRL, EOM-I and MMM PULM: Few rhonchi bilaterally, decreased BS CV: RRR, systolic murmur noted GI: BS+, soft, nontender MSK: normal bulk and tone. Gr 2 edema Neuro: does not Follow commands  LABS:  BMET  Recent Labs Lab 09/07/16 0414 09/07/16 1600 09/08/16 0500  NA 135 133* 135  K 3.5 3.6 3.4*  CL 99* 99* 98*  CO2 23 21* 24  BUN 19 21* 22*  CREATININE 1.29* 1.27* 1.20*  GLUCOSE 105* 240* 98    Electrolytes  Recent Labs Lab 09/06/16 0420  09/07/16 0414 09/07/16 1600  09/08/16 0500  CALCIUM 8.7*  < > 8.7* 8.5* 9.1  MG 2.5*  --  2.5*  --  2.5*  PHOS 2.1*  < > 2.1* 4.0 2.2*  < > = values in this interval not displayed.  CBC  Recent Labs Lab 09/07/16 0414 09/07/16 1728 09/08/16 0500  WBC 17.8* 15.1* 16.4*  HGB 8.6* 8.4* 9.6*  HCT 27.5* 26.8* 30.2*  PLT 337 335 344   Coag's  Recent Labs Lab 09/04/16 1130 09/06/16 1605  INR 1.92 1.84   Sepsis Markers  Recent Labs Lab 09/02/16 1107 09/03/16 0330 09/04/16 0415  PROCALCITON 4.63 16.37 33.02   ABG  Recent Labs Lab 09/06/16 0425 09/07/16 0420 09/08/16 0412  PHART 7.446 7.460* 7.541*  PCO2ART 37.4 35.2 29.4*  PO2ART 105.0 118* 120.0*   Liver Enzymes  Recent Labs Lab 09/06/16 0420  09/07/16 0414 09/07/16 1600 09/08/16 0500  AST 90*  --  148*  --  155*  ALT 86*  --  132*  --  152*  ALKPHOS 233*  --  230*  --  197*  BILITOT 2.2*  --  1.5*  --  2.1*  ALBUMIN 3.5  < > 3.3* 3.2* 3.2*  < > = values in this interval not displayed.  Cardiac Enzymes No results for input(s): TROPONINI, PROBNP in the last 168 hours.  Glucose  Recent Labs Lab 09/07/16 1237 09/07/16  1622 09/07/16 2007 09/08/16 0010 09/08/16 0410 09/08/16 0803  GLUCAP 174* 228* 187* 161* 98 181*   Imaging Dg Chest Port 1 View  Result Date: 09/08/2016 CLINICAL DATA:  Tracheostomy.  CHF . EXAM: PORTABLE CHEST 1 VIEW COMPARISON:  09/07/2016. FINDINGS: Tracheostomy tube and feeding tube in stable position. Bilateral IJ catheters in stable position. Prior cardiac valve replacement. Cardiomegaly with bilateral from interstitial prominence and pleural effusions consistent congestive heart failure. Slight interim progression from prior exam. IMPRESSION: 1.  Lines and tubes in stable position. 2.Prior cardiac valve replacement. Cardiomegaly with bilateral from interstitial prominence consistent with pulmonary interstitial edema. Small bilateral pleural effusions. Findings consistent with congestive heart failure. These  findings have progressed from prior exam. Electronically Signed   By: [place]   On: 09/08/2016 07:47   Dg Chest Port 1 View  Result Date: 09/07/2016 CLINICAL DATA:  New tracheostomy EXAM: PORTABLE CHEST 1 VIEW COMPARISON:  Portable exam 1257 hours compared to 09/07/2016 FINDINGS: Tracheostomy tube projects over tracheal air column. Feeding tube extends into stomach. LEFT jugular dual-lumen central venous catheter with tip projecting over high RIGHT atrium. Tip of RIGHT jugular line projects over SVC near cavoatrial junction. Epicardial pacing wires noted Enlargement of cardiac silhouette post median sternotomy and MVR. Mediastinal contours and pulmonary vascularity normal. Minimal bibasilar atelectasis. Remaining lungs clear. No pleural effusion or pneumothorax. IMPRESSION: New tracheostomy tube without acute abnormalities. Enlargement of cardiac silhouette post MVR. Electronically Signed   By: Lavonia Dana M.D.   On: 09/07/2016 13:12   STUDIES:  LHC 9/12: PCI to OM1, severe MR, EF 55 to 60% RHC 9/15: RA 5, RV 37/3/6, PA 38/18/26, PCWP 15, CI 1.9, PVR 3.3 WU TEE 9/15: EF 60 to 65%, flail motion MV with severe MR RUQ ultrasound 9/23 > Gallbladder wall thickening noted, suggestion of sludge, small R effusion, liver unremarkable but left lobe not well visulalized  MICROBIOLOGY: MRSA PCR 9/12:  Negative MRSA PCR 9/15:  Negative Tracheal Asp Ctx 9/15:  Strep agalactiae and enterobacter. Blood Ctx x2 9/15:  Negative  MRSA PCR 9/19:  Negative  Blood culture 9/24: coag neg staph 2/4  ANTIBIOTICS: Zosyn 9/16 - 9/17 Vancomycin 9/16 - 9/17 Rocephin 9/17 >> 9/24 Vancomycin 9/20 (surgical prophylaxis) Vanc 9/25 > Zosyn 9/25 >  Fluconazole 9/25 > 10/5  SIGNIFICANT EVENTS: 9/12 Admit to [facility], PCI to [place] 9/14 ETT, CRRT 9/15 to [facility] 9/16 off insulin gtt 9/20 To OR for Mitral Valve Repair w/ bioprosthetic valve 9/28 To OR for permcath 9/29 Extubated. 9/30 Reintubated  LINES/TUBES: Right  IJ PICC 9/12 - 9/20 Rt femoral introducer 9/15 - 9/20 OETT 8.0 9/14 >> 9/29, 9/30 >> HD cath left IJ 9/14 >> R IJ Introducer/CVL/[place] 9/20 >>  9/25 IABP 9/15 >>9/22 Lt femoral a line 9/15 >> 9/25 Y Chest Tube/Mediastinal Chest Tube 9/20 >>out PIV x1 L brachial art line 9/25 > 9/28 R IJ CVL > 9/28 L IJ Permcath >   ASSESSMENT / PLAN:  PULMONARY A: Acute Hypoxic Respiratory Failure - Secondary to acute pulmonary edema, reintubated 9/30 Aspiration risk P:   PS as tolerated Titrate O2 Continue to hold plavix given bleeding  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 Post-op Mgt per CVTS/Cardiology. Titrate levo gtt down to 1 mcg, cont vaso drip. ASA, per cardiology/CVTS  Amio, Milrinone gtt's per cardiology/CVTS.  RENAL A:  Acute Renal Failure - Baseline creatinine 1.6-1.7  P:   Nephrology Following Keep even on CVVHD Monitor BMET and UOP Replace electrolytes as needed  GASTROINTESTINAL A:   Transaminitis - peaked 9/23, near complete resolution, unclear etiology,  Mild Malnutrition - Related to critical illness  P:   Pepcid IV q12hr Replaced NGT - cont TF  D/C fluconazole and recheck LFTs in AM.  HEMATOLOGIC A:   Anemia - Last transfusion 9/20 Leukocytosis - Multifactorial  P:  Trending cell counts w/ CBC SCDs  INFECTIOUS A:   Group B Strep Pneumonia Coag negative staph bacteremia (likely contaminant) Line changes on 9/28 Leucocytosis Enterobacter species P:   Vanc and zosyn restarted on 9/25  > stopped 9/29 but resumed 9/30, high pct noted.  Re-culture for fever. Diflucan off, cultures negative and LFTs elevated  ENDOCRINE A:   DM - Glucose controlled  P:   Subcutaneous insulin per CVTS Levemir BID  NEUROLOGIC A:   New onset R sided hemiplegia and Preferental gaze to Right  (9/25). Cranial CT scan was (-) for acute changes >> resolved without evidence of weakness on 9/29 Acute Encephalopathy - Multifactorial and likely combination toxic metabolic & hypoxia   P:   Follow neuro exam Fent and versed prn pain  The patient is critically ill with multiple organ systems failure and requires high complexity decision making for assessment and support, frequent evaluation and titration of therapies, application of advanced monitoring technologies and extensive interpretation of multiple databases.   Critical Care Time devoted to patient care services described in this note is  35  Minutes. This time reflects time of care of this signee Dr Jennet Maduro. This critical care time does not reflect procedure time, or teaching time or supervisory time of PA/NP/Med student/Med Resident etc but could involve care discussion time.  Rush Farmer, M.D. [facility] Pulmonary/Critical Care Medicine. Pager: [phone]. After hours pager: [phone].  09/08/2016, 10:29 AM

## 2016-09-08 NOTE — Progress Notes (Signed)
8 Days Post-Op Procedure(s) (LRB): INSERTION OF DIALYSIS CATHETER LEFT INTERNAL JUGULAR VEIN & INSERTION OF TRIPLE LUMEN RIGHT INTERNAL JUGULAR VEIN (Bilateral) Subjective: Persistent bleeding from trach - transfused 1 u PRBC Hold heparin in CVVH   Objective: Vital signs in last 24 hours: Temp:  [96.4 F (35.8 C)-98.4 F (36.9 C)] 98.4 F (36.9 C) (10/06 0730) Pulse Rate:  [70-104] 98 (10/06 0730) Cardiac Rhythm: Normal sinus rhythm (10/06 0700) Resp:  [18-28] 21 (10/06 0730) BP: (98-143)/(62-105) 98/63 (10/06 0200) SpO2:  [100 %] 100 % (10/06 0730) Arterial Line BP: (85-150)/(44-83) 103/57 (10/06 0730) FiO2 (%):  [40 %-100 %] 40 % (10/06 0700) Weight:  [139 lb 1.8 oz (63.1 kg)] 139 lb 1.8 oz (63.1 kg) (10/06 0415)  Hemodynamic parameters for last 24 hours: CVP:  [8 mmHg-56 mmHg] 12 mmHg  Intake/Output from previous day: 10/05 0701 - 10/06 0700 In: 2897.1 [I.V.:894.1; Blood:335; NG/GT:910; IV Piggyback:758] Out: 2930 [Stool:150] Intake/Output this shift: Total I/O In: 42.5 [I.V.:22.5; NG/GT:20] Out: 60 [Other:60]       Exam    General- alert and comfortable   Lungs- clear without rales, wheezes   Cor- regular rate and rhythm, no murmur , gallop   Abdomen- soft, non-tender   Extremities - warm, non-tender, minimal edema   Neuro- oriented, appropriate, no focal weakness   Lab Results:  Recent Labs  09/07/16 1728 09/08/16 0500  WBC 15.1* 16.4*  HGB 8.4* 9.6*  HCT 26.8* 30.2*  PLT 335 344   BMET:  Recent Labs  09/07/16 1600 09/08/16 0500  NA 133* 135  K 3.6 3.4*  CL 99* 98*  CO2 21* 24  GLUCOSE 240* 98  BUN 21* 22*  CREATININE 1.27* 1.20*  CALCIUM 8.5* 9.1    PT/INR:  Recent Labs  09/06/16 1605  LABPROT 21.5*  INR 1.84   ABG    Component Value Date/Time   PHART 7.541 (H) 09/08/2016 0412   HCO3 25.2 09/08/2016 0412   TCO2 26 09/08/2016 0412   ACIDBASEDEF 4.1 (H) 09/02/2016 1850   O2SAT 72.9 09/08/2016 0427   CBG (last 3)   Recent  Labs  09/07/16 2007 09/08/16 0010 09/08/16 0410  GLUCAP 187* 161* 98    Assessment/Plan: S/P Procedure(s) (LRB): INSERTION OF DIALYSIS CATHETER LEFT INTERNAL JUGULAR VEIN & INSERTION OF TRIPLE LUMEN RIGHT INTERNAL JUGULAR VEIN (Bilateral) Stop heparin Check vanc level Back in A-flutter - RAP w/o success OOB to chair  LOS: 21 days    Deborah [name] 09/08/2016

## 2016-09-08 NOTE — Progress Notes (Signed)
Subjective:   Remains critically ill overnight-pressor decreasing some  CRRT running well - s/p trach. No UOP Objective Vital signs in last 24 hours: Vitals:   09/08/16 0645 09/08/16 0700 09/08/16 0715 09/08/16 0730  BP:      Pulse: 97 97 99 98  Resp: (!) 22 (!) 24 (!) 24 (!) 21  Temp:      TempSrc:      SpO2: 100% 100% 100% 100%  Weight:      Height:       Weight change: -2.1 kg (-4 lb 10.1 oz)  Intake/Output Summary (Last 24 hours) at 09/08/16 0802 Last data filed at 09/08/16 0700  Gross per 24 hour  Intake          2728.71 ml  Output             2894 ml  Net          -165.29 ml    Assessment/ Plan: Pt is a 60 y.o. yo female who was admitted on 08/18/2016 with STEMI- then severe MR- s/p stenting and eventually MVR - clinical decline of late with reintubation- possible PNA Assessment/Plan: 1. Renal- A on CRF- essentially on CRRT since 9/14- most recent dialysis catheter in since 9/28- still unstable requiring pressors.  Given this prolonged AKI - HD dep for nearly 3  weeks would think renal recovery is becoming less likely as time passes.  Continue CRRT with hemodynamic instability 2. HTN/vol-  Running even with CRRT- peripheral edema with low alb- CVP in the low teens -CTS giving albumin - OK to give separate from CRRT  3. Anemia- this AM in the 8's- added ESA- transfuse as needed per primary (one unit 10/3) 4. Secondary hyperparathyroidism- s/p phos repletion- needs again-  no binders- K is stable  5. CV- per CTS- s/p MVR - atrial arrythmias- on pressors and midodrine- s/p cardioversion 10/2 6. ID- high WBC on vanc /zosyn - coming down   Deborah [name] A    Labs: Basic Metabolic Panel:  Recent Labs Lab 09/07/16 0414 09/07/16 1600 09/08/16 0500  NA 135 133* 135  K 3.5 3.6 3.4*  CL 99* 99* 98*  CO2 23 21* 24  GLUCOSE 105* 240* 98  BUN 19 21* 22*  CREATININE 1.29* 1.27* 1.20*  CALCIUM 8.7* 8.5* 9.1  PHOS 2.1* 4.0 2.2*   Liver Function Tests:  Recent  Labs Lab 09/06/16 0420  09/07/16 0414 09/07/16 1600 09/08/16 0500  AST 90*  --  148*  --  155*  ALT 86*  --  132*  --  152*  ALKPHOS 233*  --  230*  --  197*  BILITOT 2.2*  --  1.5*  --  2.1*  PROT 7.3  --  7.0  --  7.1  ALBUMIN 3.5  < > 3.3* 3.2* 3.2*  < > = values in this interval not displayed. No results for input(s): LIPASE, AMYLASE in the last 168 hours.  Recent Labs Lab 09/02/16 1752  AMMONIA 23   CBC:  Recent Labs Lab 09/05/16 1507 09/06/16 0420 09/07/16 0414 09/07/16 1728 09/08/16 0500  WBC 22.1* 22.3* 17.8* 15.1* 16.4*  HGB 8.8* 9.0* 8.6* 8.4* 9.6*  HCT 27.6* 28.2* 27.5* 26.8* 30.2*  MCV 92.6 94.0 93.9 94.4 92.6  PLT 324 332 337 335 344   Cardiac Enzymes: No results for input(s): CKTOTAL, CKMB, CKMBINDEX, TROPONINI in the last 168 hours. CBG:  Recent Labs Lab 09/07/16 1237 09/07/16 1622 09/07/16 2007 09/08/16 0010 09/08/16 0410  GLUCAP 174*  228* 187* 161* 98    Iron Studies: No results for input(s): IRON, TIBC, TRANSFERRIN, FERRITIN in the last 72 hours. Studies/Results: Dg Chest Port 1 View  Result Date: 09/08/2016 CLINICAL DATA:  Tracheostomy.  CHF . EXAM: PORTABLE CHEST 1 VIEW COMPARISON:  09/07/2016. FINDINGS: Tracheostomy tube and feeding tube in stable position. Bilateral IJ catheters in stable position. Prior cardiac valve replacement. Cardiomegaly with bilateral from interstitial prominence and pleural effusions consistent congestive heart failure. Slight interim progression from prior exam. IMPRESSION: 1.  Lines and tubes in stable position. 2.Prior cardiac valve replacement. Cardiomegaly with bilateral from interstitial prominence consistent with pulmonary interstitial edema. Small bilateral pleural effusions. Findings consistent with congestive heart failure. These findings have progressed from prior exam. Electronically Signed   By: [name]   On: 09/08/2016 07:47   Dg Chest Port 1 View  Result Date: 09/07/2016 CLINICAL DATA:  New  tracheostomy EXAM: PORTABLE CHEST 1 VIEW COMPARISON:  Portable exam 1257 hours compared to 09/07/2016 FINDINGS: Tracheostomy tube projects over tracheal air column. Feeding tube extends into stomach. LEFT jugular dual-lumen central venous catheter with tip projecting over high RIGHT atrium. Tip of RIGHT jugular line projects over SVC near cavoatrial junction. Epicardial pacing wires noted Enlargement of cardiac silhouette post median sternotomy and MVR. Mediastinal contours and pulmonary vascularity normal. Minimal bibasilar atelectasis. Remaining lungs clear. No pleural effusion or pneumothorax. IMPRESSION: New tracheostomy tube without acute abnormalities. Enlargement of cardiac silhouette post MVR. Electronically Signed   By: Lavonia Dana M.D.   On: 09/07/2016 13:12   Dg Chest Port 1 View  Result Date: 09/07/2016 CLINICAL DATA:  Mitral valve replacement . EXAM: PORTABLE CHEST 1 VIEW COMPARISON:  09/05/2016 . FINDINGS: Endotracheal to, feeding tube, dual lumen left IJ catheter in stable position. Prior cardiac valve replacement. Cardiomegaly with mild bilateral from interstitial prominence right side greater than left suggesting congestive heart failure. Low lung volumes. IMPRESSION: 1.  Lines and tubes in stable position. 2. Prior cardiac valve replacement. Stable cardiomegaly. Mild increased interstitial markings noted bilaterally right side greater than left suggesting mild congestive heart failure . Electronically Signed   By: Marcello Moores  Register   On: 09/07/2016 07:55   Dg Abd Portable 1v  Result Date: 09/06/2016 CLINICAL DATA:  Feeding tube placement EXAM: PORTABLE ABDOMEN - 1 VIEW COMPARISON:  Earlier film of the same day FINDINGS: Feeding tube is been revised, tip now in the distal duodenum. Visualized bowel gas pattern unremarkable. Central venous catheters extend to the cavoatrial junction. Previous median sternotomy and MVR. IMPRESSION: 1. Feeding tube to distal duodenum. Electronically Signed   By:  [name]   On: 09/06/2016 15:37   Dg Abd Portable 1v  Result Date: 09/06/2016 CLINICAL DATA:  Feeding tube placement EXAM: PORTABLE ABDOMEN - 1 VIEW COMPARISON:  Supine abdominal radiograph of September 03, 2016 FINDINGS: There is coiling of the radiodense tipped feeding tube in the distal gastric body with orientation of the tip back proximally toward the fundus. The tube does not appear cross the pylorus. The stomach is not abnormally distended. The bowel gas pattern elsewhere is relatively nonspecific and appears stable. IMPRESSION: Feeding tube tip lying in the distal gastric fundus with some coiling of the tube more distally at the pyloric region. Electronically Signed   By: David  Martinique M.D.   On: 09/06/2016 12:36   Medications: Infusions: . sodium chloride    . sodium chloride 10 mL/hr at 09/06/16 2000  . sodium chloride    . amiodarone 30  mg/hr (09/08/16 0324)  . heparin 10,000 units/ 20 mL infusion syringe 1,450 Units/hr (09/08/16 0433)  . milrinone 0.25 mcg/kg/min (09/08/16 0700)  . norepinephrine (LEVOPHED) Adult infusion 7 mcg/min (09/08/16 0700)  . dialysis replacement fluid (prismasate) 600 mL/hr at 09/08/16 0429  . dialysis replacement fluid (prismasate) 200 mL/hr at 09/08/16 0033  . dialysate (PRISMASATE) 1,000 mL/hr at 09/08/16 0430    Scheduled Medications: . atorvastatin  20 mg Oral q1800  . chlorhexidine gluconate (MEDLINE KIT)  15 mL Mouth Rinse BID  . clopidogrel  75 mg Oral Daily  . darbepoetin (ARANESP) injection - NON-DIALYSIS  100 mcg Subcutaneous Q Mon-1800  . famotidine  20 mg Oral Daily  . feeding supplement (NEPRO CARB STEADY)  1,000 mL Per Tube Q24H  . feeding supplement (PRO-STAT SUGAR FREE 64)  30 mL Per Tube 5 X Daily  . Gerhardt's butt cream   Topical BID  . imipenem-cilastatin  500 mg Intravenous Q8H  . insulin aspart  0-24 Units Subcutaneous Q4H  . insulin detemir  10 Units Subcutaneous BID  . mouth rinse  15 mL Mouth Rinse 10 times per day   . midodrine  5 mg Oral TID WC  . propofol  5-80 mcg/kg/min Intravenous Once  . sodium chloride flush  3 mL Intravenous Q12H  . sodium chloride flush  3 mL Intravenous Q12H  . vancomycin  750 mg Intravenous Q24H    have reviewed scheduled and prn medications.  Physical Exam: General: alert- responding to questions Heart: tachy Lungs: CBS bilat Abdomen: soft, non tender Extremities: pitting edema Dialysis Access: left IJ vascath placed 9/28    09/08/2016,8:02 AM  LOS: 21 days

## 2016-09-08 NOTE — Progress Notes (Signed)
Patient ID: DEVONNE KITCHEN, female   DOB: Apr 15, 1956, 60 y.o.   MRN: [id number] EVENING ROUNDS NOTE :     [place] [address] [place]       [place] [address]             [phone]                 8 Days Post-Op Procedure(s) (LRB): INSERTION OF DIALYSIS CATHETER LEFT INTERNAL JUGULAR VEIN & INSERTION OF TRIPLE LUMEN RIGHT INTERNAL JUGULAR VEIN (Bilateral)  Total Length of Stay:  LOS: 21 days  BP (!) 110/51   Pulse 85   Temp 97.7 F (36.5 C) (Oral)   Resp (!) 24   Ht 5\' 1"  (1.549 m)   Wt 139 lb 1.8 oz (63.1 kg)   SpO2 100%   BMI 26.28 kg/m   .Intake/Output      10/05 0701 - 10/06 0700 10/06 0701 - 10/07 0700   I.V. (mL/kg) 894.1 (14.2) 404.8 (6.4)   Blood 335    NG/GT 910 140   IV Piggyback 758 558   Total Intake(mL/kg) 2897.1 (45.9) 1102.8 (17.5)   Other 2780 1300   Stool 150    Total Output 2930 1300   Net -32.9 -197.2          . sodium chloride    . sodium chloride 10 mL/hr at 09/08/16 1800  . sodium chloride    . amiodarone 30 mg/hr (09/08/16 1800)  . milrinone 0.125 mcg/kg/min (09/08/16 1800)  . norepinephrine (LEVOPHED) Adult infusion 2 mcg/min (09/08/16 1800)  . dialysis replacement fluid (prismasate) 600 mL/hr at 09/08/16 0429  . dialysis replacement fluid (prismasate) 200 mL/hr at 09/08/16 0033  . dialysate (PRISMASATE) 1,000 mL/hr at 09/08/16 0430     Lab Results  Component Value Date   WBC 17.9 (H) 09/08/2016   HGB 9.1 (L) 09/08/2016   HCT 28.6 (L) 09/08/2016   PLT 317 09/08/2016   GLUCOSE 70 09/08/2016   CHOL 207 (H) 07/09/2014   TRIG 174.0 (H) 07/09/2014   HDL 40.60 07/09/2014   LDLCALC 132 (H) 07/09/2014   ALT 152 (H) 09/08/2016   AST 155 (H) 09/08/2016   NA 135 09/08/2016   K 3.4 (L) 09/08/2016   CL 100 (L) 09/08/2016   CREATININE 1.22 (H) 09/08/2016   BUN 23 (H) 09/08/2016   CO2 23 09/08/2016   TSH 1.485 Test methodology is 3rd generation TSH 01/26/2010   INR 1.84 09/06/2016   HGBA1C 13.3 (H) 08/15/2016   MICROALBUR 15.8 (H)  07/09/2014   Still on drips, up to chair  Still has episodes of a flutter    Grace Isaac MD  [place] Office [phone] 09/08/2016 6:59 PM

## 2016-09-08 NOTE — Progress Notes (Signed)
Patient ID: Deborah [name], female   DOB: [birth year], 60 y.o.   MRN: [id number]   60 y/o woman with DM2, HTN, HL and noncompliance presented to [facility] ER with infrerior posterior STEMI on 9/12. Taken to cath lab and found to have occluded small to moderate OM-1 branch. Underwent successful PCS with stent x 2 by Dr. Clayborn Bigness. Coronaries otherwise ok. EF 55-60% with severe MR confirmed by echo.   Post-cath developed respiratory failure and required intubation. Developed shock and renal failure with peak creatinine 3.4 (1.6 on admit). Transferred to [facility] for furthe management on 9/15.Marland Kitchen TEE showed normal LV (EF 60-65%) and normal RV function with ruptured posterior papillary muscle with severe posterior MR.  Underwent placement of Swan and IABP. S/P MVR 08/23/16.   Treated for group B Strep PNA.   Echo 9/23  RV and LV are normal (EF 60%) but appeared underfilled   Extubated 9/29.  However, picture consistent with septic shock on 9/30 so pressors started and she was re-intubated.  Developed AFL S/P TEE DC-CV 09/04/16    S/P Trach 10/5. Todays CO-OX 73%. Remains on milrinone 0.25 and norepi 5 mcg. Respiratory aspirate- enterobacter on imipenem.   CVVH but not pulling fluid.    Scheduled Meds: . atorvastatin  20 mg Oral q1800  . chlorhexidine gluconate (MEDLINE KIT)  15 mL Mouth Rinse BID  . darbepoetin (ARANESP) injection - NON-DIALYSIS  100 mcg Subcutaneous Q Mon-1800  . famotidine  20 mg Oral Daily  . feeding supplement (NEPRO CARB STEADY)  1,000 mL Per Tube Q24H  . feeding supplement (PRO-STAT SUGAR FREE 64)  30 mL Per Tube 5 X Daily  . Gerhardt's butt cream   Topical BID  . imipenem-cilastatin  500 mg Intravenous Q8H  . insulin aspart  0-24 Units Subcutaneous Q4H  . insulin detemir  10 Units Subcutaneous BID  . mouth rinse  15 mL Mouth Rinse 10 times per day  . midodrine  5 mg Oral TID WC  . propofol  5-80 mcg/kg/min Intravenous Once  . sodium chloride flush  3 mL Intravenous Q12H  . sodium  chloride flush  3 mL Intravenous Q12H   Continuous Infusions: . sodium chloride    . sodium chloride 10 mL/hr at 09/08/16 1900  . sodium chloride    . amiodarone 30 mg/hr (09/08/16 1900)  . milrinone 0.125 mcg/kg/min (09/08/16 1900)  . norepinephrine (LEVOPHED) Adult infusion 2 mcg/min (09/08/16 1900)  . dialysis replacement fluid (prismasate) 600 mL/hr at 09/08/16 0429  . dialysis replacement fluid (prismasate) 200 mL/hr at 09/08/16 0033  . dialysate (PRISMASATE) 1,000 mL/hr at 09/08/16 0430   PRN Meds:.Place/Maintain arterial line **AND** sodium chloride, fentaNYL (SUBLIMAZE) injection, HYDROcodone-acetaminophen, midazolam, ondansetron (ZOFRAN) IV, sodium chloride, sodium chloride flush, sodium chloride flush    Vitals:   09/08/16 1730 09/08/16 1745 09/08/16 1800 09/08/16 1815  BP:      Pulse: 86 85 84 85  Resp: (!) 21 (!) 22 (!) 27 (!) 24  Temp:      TempSrc:      SpO2: 100% 100% 100% 100%  Weight:      Height:        Intake/Output Summary (Last 24 hours) at 09/08/16 2012 Last data filed at 09/08/16 1900  Gross per 24 hour  Intake          2402.43 ml  Output             2713 ml  Net          -  310.57 ml    LABS: Basic Metabolic Panel:  Recent Labs  09/07/16 0414  09/08/16 0500 09/08/16 1600  NA 135  < > 135 135  K 3.5  < > 3.4* 3.4*  CL 99*  < > 98* 100*  CO2 23  < > 24 23  GLUCOSE 105*  < > 98 70  BUN 19  < > 22* 23*  CREATININE 1.29*  < > 1.20* 1.22*  CALCIUM 8.7*  < > 9.1 8.8*  MG 2.5*  --  2.5*  --   PHOS 2.1*  < > 2.2* 4.0  < > = values in this interval not displayed. Liver Function Tests:  Recent Labs  09/07/16 0414  09/08/16 0500 09/08/16 1600  AST 148*  --  155*  --   ALT 132*  --  152*  --   ALKPHOS 230*  --  197*  --   BILITOT 1.5*  --  2.1*  --   PROT 7.0  --  7.1  --   ALBUMIN 3.3*  < > 3.2* 3.1*  < > = values in this interval not displayed. No results for input(s): LIPASE, AMYLASE in the last 72 hours. CBC:  Recent Labs   09/08/16 0500 09/08/16 1635  WBC 16.4* 17.9*  HGB 9.6* 9.1*  HCT 30.2* 28.6*  MCV 92.6 92.9  PLT 344 317   Cardiac Enzymes: No results for input(s): CKTOTAL, CKMB, CKMBINDEX, TROPONINI in the last 72 hours. BNP: Invalid input(s): POCBNP D-Dimer: No results for input(s): DDIMER in the last 72 hours. Hemoglobin A1C: No results for input(s): HGBA1C in the last 72 hours. Fasting Lipid Panel: No results for input(s): CHOL, HDL, LDLCALC, TRIG, CHOLHDL, LDLDIRECT in the last 72 hours. Thyroid Function Tests: No results for input(s): TSH, T4TOTAL, T3FREE, THYROIDAB in the last 72 hours.  Invalid input(s): FREET3 Anemia Panel: No results for input(s): VITAMINB12, FOLATE, FERRITIN, TIBC, IRON, RETICCTPCT in the last 72 hours.  PHYSICAL EXAM General: Sitting in chair Neck: RIJ TLC LIJ trialysis Trach with bloody exudate Lungs: Clear anteriorly CV: Nondisplaced PMI.  Heart tachy, regular S1/S2, no S3/S4, 1/6 SEM USB.   Abdomen: Soft, ND, no HSM. No bruits or masses. +BS  Neurologic: Awake.   Extremities: No clubbing or cyanosis. R and LLE SCDs.   TELEMETRY: A fib 100s.   Assessment:   1. Cardiogenic shock: Due to acute severe MR. EF 70% on 9/25 echo.  2. CAD: Inferior posterior STEMI on 9/13 with DES x 2 to OM-1      --Plavix restarted on 9/22 3. 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     --s/p MVR 08/23/2016  4. AKI on CKD stage III - due to ATN likely from cardiogenic shock + contrast with cath. Has required CVVH.  5. Acute respiratory failure: Pulmonary edema.  6. DM2 7. Suspect component of septic shock: Possible PNA, group B Strep in sputum culture.  8. Anemia 9. PVCs   10. Shock liver 11. MRSA bacteremia 12. Atrial fibrillation: Paroxysmal.  On amiodarone.     Plan/Discussion:   S/P Trach 10//5.   Septic shock,  WBC down to 15. Respiratory culture - Enterobacter. Started on imipenem.  Remains on vancomycin.    Remains on milrinone 0.25 mcg. Weaning norepi.  Co-ox  73%.    On CVVH remaining even.    S/P TEE DC-CV on 10/3 . Continue  amiodarone gtt . Back in A fib.   Hemoglobin  up to 9.6     Amy Clegg, NP-C  09/08/2016  Advanced Heart Failure Team Pager [phone] (M-F; 7a - 4p)  Please contact [facility] for night-coverage after hours (4p -7a ) and weekends on [email or web address]     Patient seen and examined with Darrick Grinder, NP. We discussed all aspects of the encounter. I agree with the assessment and plan as stated above.   Improved today. Now in chair this evening. S/p trach. Bleeding from trach earlier today but now seems to have improved. Weaning norepi. Co-ox 73%. Will cut milrinone to 0.125 and hopefully that will help BP. Was in AFL earlier today but now seems to be back in NSR. Remains on CVVHD - keeping even. Volume status looks ok.  Continue PT and broad spectrum abx.  Adriano Bischof,MD 8:16 PM

## 2016-09-09 ENCOUNTER — Encounter (HOSPITAL_COMMUNITY): Payer: Self-pay | Admitting: Vascular Surgery

## 2016-09-09 ENCOUNTER — Inpatient Hospital Stay (HOSPITAL_COMMUNITY): Payer: Medicaid Other

## 2016-09-09 LAB — MAGNESIUM: Magnesium: 2.6 mg/dL — ABNORMAL HIGH (ref 1.7–2.4)

## 2016-09-09 LAB — COMPREHENSIVE METABOLIC PANEL
ALT: 129 U/L — ABNORMAL HIGH (ref 14–54)
AST: 98 U/L — ABNORMAL HIGH (ref 15–41)
Albumin: 2.9 g/dL — ABNORMAL LOW (ref 3.5–5.0)
Alkaline Phosphatase: 155 U/L — ABNORMAL HIGH (ref 38–126)
Anion gap: 12 (ref 5–15)
BUN: 25 mg/dL — ABNORMAL HIGH (ref 6–20)
CO2: 23 mmol/L (ref 22–32)
Calcium: 8.7 mg/dL — ABNORMAL LOW (ref 8.9–10.3)
Chloride: 100 mmol/L — ABNORMAL LOW (ref 101–111)
Creatinine, Ser: 1.28 mg/dL — ABNORMAL HIGH (ref 0.44–1.00)
GFR calc Af Amer: 52 mL/min — ABNORMAL LOW (ref >60)
GFR calc non Af Amer: 45 mL/min — ABNORMAL LOW (ref >60)
Glucose, Bld: 185 mg/dL — ABNORMAL HIGH (ref 65–99)
Potassium: 3.6 mmol/L (ref 3.5–5.1)
Sodium: 135 mmol/L (ref 135–145)
Total Bilirubin: 1.2 mg/dL (ref 0.3–1.2)
Total Protein: 6.5 g/dL (ref 6.5–8.1)

## 2016-09-09 LAB — CBC
HCT: 28.4 % — ABNORMAL LOW (ref 36.0–46.0)
Hemoglobin: 8.7 g/dL — ABNORMAL LOW (ref 12.0–15.0)
MCH: 28.6 pg (ref 26.0–34.0)
MCHC: 30.6 g/dL (ref 30.0–36.0)
MCV: 93.4 fL (ref 78.0–100.0)
Platelets: 288 10*3/uL (ref 150–400)
RBC: 3.04 MIL/uL — ABNORMAL LOW (ref 3.87–5.11)
RDW: 19.5 % — ABNORMAL HIGH (ref 11.5–15.5)
WBC: 16.5 10*3/uL — ABNORMAL HIGH (ref 4.0–10.5)

## 2016-09-09 LAB — RENAL FUNCTION PANEL
ANION GAP: 9 (ref 5–15)
Albumin: 2.8 g/dL — ABNORMAL LOW (ref 3.5–5.0)
BUN: 26 mg/dL — ABNORMAL HIGH (ref 6–20)
CHLORIDE: 103 mmol/L (ref 101–111)
CO2: 24 mmol/L (ref 22–32)
Calcium: 8.8 mg/dL — ABNORMAL LOW (ref 8.9–10.3)
Creatinine, Ser: 1.31 mg/dL — ABNORMAL HIGH (ref 0.44–1.00)
GFR calc Af Amer: 51 mL/min — ABNORMAL LOW (ref >60)
GFR calc non Af Amer: 44 mL/min — ABNORMAL LOW (ref >60)
GLUCOSE: 102 mg/dL — AB (ref 65–99)
POTASSIUM: 3.7 mmol/L (ref 3.5–5.1)
Phosphorus: 2.2 mg/dL — ABNORMAL LOW (ref 2.5–4.6)
Sodium: 136 mmol/L (ref 135–145)

## 2016-09-09 LAB — GLUCOSE, CAPILLARY
GLUCOSE-CAPILLARY: 224 mg/dL — AB (ref 65–99)
GLUCOSE-CAPILLARY: 66 mg/dL (ref 65–99)
GLUCOSE-CAPILLARY: 151 mg/dL — AB (ref 65–99)
Glucose-Capillary: 174 mg/dL — ABNORMAL HIGH (ref 65–99)
Glucose-Capillary: 175 mg/dL — ABNORMAL HIGH (ref 65–99)
Glucose-Capillary: 182 mg/dL — ABNORMAL HIGH (ref 65–99)
Glucose-Capillary: 188 mg/dL — ABNORMAL HIGH (ref 65–99)

## 2016-09-09 LAB — CARBOXYHEMOGLOBIN
Carboxyhemoglobin: 1.2 % (ref 0.5–1.5)
Methemoglobin: 1.3 % (ref 0.0–1.5)
O2 Saturation: 62.9 %
Total hemoglobin: 7 g/dL — ABNORMAL LOW (ref 12.0–16.0)

## 2016-09-09 LAB — PHOSPHORUS: PHOSPHORUS: 3 mg/dL (ref 2.5–4.6)

## 2016-09-09 MED ORDER — MIDODRINE HCL 5 MG PO TABS
10.0000 mg | ORAL_TABLET | Freq: Three times a day (TID) | ORAL | Status: DC
  Administered 2016-09-09 – 2016-10-02 (×63): 10 mg via ORAL
  Filled 2016-09-09 (×62): qty 2

## 2016-09-09 NOTE — Progress Notes (Signed)
Patient ID: Deborah [name], female   DOB: 1956-05-26, 60 y.o.   MRN: [id number] EVENING ROUNDS NOTE :     [address]       [place] [address]             [phone]                 9 Days Post-Op Procedure(s) (LRB): INSERTION OF DIALYSIS CATHETER LEFT INTERNAL JUGULAR VEIN & INSERTION OF TRIPLE LUMEN RIGHT INTERNAL JUGULAR VEIN (Bilateral)  Total Length of Stay:  LOS: 22 days  BP (!) 114/51   Pulse 75   Temp 98.1 F (36.7 C) (Axillary)   Resp (!) 27   Ht 5\' 1"  (1.549 m)   Wt 141 lb 8.6 oz (64.2 kg)   SpO2 (!) 82%   BMI 26.74 kg/m   .Intake/Output      10/07 0701 - 10/08 0700   I.V. (mL/kg) 360.7 (5.6)   NG/GT 600   IV Piggyback 100   Total Intake(mL/kg) 1060.7 (16.5)   Urine (mL/kg/hr) 0 (0)   Emesis/NG output 0 (0)   Other 1301 (1.6)   Stool 300 (0.4)   Total Output 1601   Net -540.3       Urine Occurrence 0 x     . sodium chloride    . sodium chloride 10 mL (09/08/16 2036)  . amiodarone 30 mg/hr (09/09/16 1900)  . norepinephrine (LEVOPHED) Adult infusion Stopped (09/09/16 1700)  . dialysis replacement fluid (prismasate) 600 mL/hr at 09/09/16 1312  . dialysis replacement fluid (prismasate) 200 mL/hr at 09/08/16 2035  . dialysate (PRISMASATE) 1,000 mL/hr at 09/09/16 1700     Lab Results  Component Value Date   WBC 16.5 (H) 09/09/2016   HGB 8.7 (L) 09/09/2016   HCT 28.4 (L) 09/09/2016   PLT 288 09/09/2016   GLUCOSE 102 (H) 09/09/2016   CHOL 207 (H) 07/09/2014   TRIG 174.0 (H) 07/09/2014   HDL 40.60 07/09/2014   LDLCALC 132 (H) 07/09/2014   ALT 129 (H) 09/09/2016   AST 98 (H) 09/09/2016   NA 136 09/09/2016   K 3.7 09/09/2016   CL 103 09/09/2016   CREATININE 1.31 (H) 09/09/2016   BUN 26 (H) 09/09/2016   CO2 24 09/09/2016   TSH 1.485 Test methodology is 3rd generation TSH 01/26/2010   INR 1.84 09/06/2016   HGBA1C 13.3 (H) 08/15/2016   MICROALBUR 15.8 (H) 07/09/2014   On trach collar all day, comfortable levaphed had been on low  dose , now off  Grace Isaac MD  [facility] [phone] Office [phone] 09/09/2016 7:18 PM

## 2016-09-09 NOTE — Progress Notes (Signed)
PULMONARY / CRITICAL CARE MEDICINE   Name: Deborah [name] MRN: [id number] DOB: 19-Jun-1956    ADMISSION DATE:  08/18/2016 CONSULTATION DATE:  08/18/2016  REFERRING MD:  Bensimhon   CHIEF COMPLAINT:  Ventilator management and critical care services   HISTORY OF PRESENT ILLNESS:   60 yo female admitted to [facility] 08/15/16 with STEMI s/p cath to [place].  Developed cardiogenic shock, VDRF (ETT 9/14), AKI with metabolic acidosis and started on CRRT 08/17/16.  Transferred to [facility] to tx shock and evaluated new severe MR from papillary muscle rupture.  She also had fever and found to have Group B Strep in sputum culture. Mitral valve replacement 9/20.  SUBJECTIVE:  Tol PS wean. No events overnight. Weaning levo. Remains on CVVHD  VITAL SIGNS: BP (!) 80/44   Pulse (!) 106   Temp 98.1 F (36.7 C) (Axillary)   Resp (!) 24   Ht 5\' 1"  (1.549 m)   Wt 64.2 kg (141 lb 8.6 oz)   SpO2 100%   BMI 26.74 kg/m   HEMODYNAMICS: CVP:  [0 mmHg-10 mmHg] 9 mmHg  VENTILATOR SETTINGS: Vent Mode: CPAP;PSV FiO2 (%):  [40 %] 40 % Set Rate:  [24 bmp] 24 bmp Vt Set:  [420 mL] 420 mL PEEP:  [5 cmH20-8 cmH20] 5 cmH20 Pressure Support:  [8 cmH20] 8 cmH20 Plateau Pressure:  [15 cmH20-21 cmH20] 21 cmH20  INTAKE / OUTPUT: I/O last 3 completed shifts: In: 3730.5 [I.V.:1349.5; Blood:335; NG/GT:1088; IV Piggyback:958] Out: 8527 [POEUM:3536; Stool:325]  PHYSICAL EXAMINATION: General: acutely ill, NAD, moves spont. HENT: Trach in place, [place]/AT, PERRL, EOM-I and MMM PULM: resps even non labored on PS 8/5. Few rhonchi bilaterally, decreased BS CV: RRR, systolic murmur noted GI: BS+, soft, nontender MSK: normal bulk and tone. Gr 2 edema Neuro: awake, alert, follows commands, nods appropriately, MAE, gen weakness   LABS:  BMET  Recent Labs Lab 09/08/16 0500 09/08/16 1600 09/09/16 0355  NA 135 135 135  K 3.4* 3.4* 3.6  CL 98* 100* 100*  CO2 24 23 23   BUN 22* 23* 25*  CREATININE 1.20* 1.22* 1.28*  GLUCOSE 98 70  185*    Electrolytes  Recent Labs Lab 09/07/16 0414  09/08/16 0500 09/08/16 1600 09/09/16 0355  CALCIUM 8.7*  < > 9.1 8.8* 8.7*  MG 2.5*  --  2.5*  --  2.6*  PHOS 2.1*  < > 2.2* 4.0 3.0  < > = values in this interval not displayed.  CBC  Recent Labs Lab 09/08/16 0500 09/08/16 1635 09/09/16 0355  WBC 16.4* 17.9* 16.5*  HGB 9.6* 9.1* 8.7*  HCT 30.2* 28.6* 28.4*  PLT 344 317 288   Coag's  Recent Labs Lab 09/04/16 1130 09/06/16 1605  INR 1.92 1.84   Sepsis Markers  Recent Labs Lab 09/02/16 1107 09/03/16 0330 09/04/16 0415  PROCALCITON 4.63 16.37 33.02   ABG  Recent Labs Lab 09/06/16 0425 09/07/16 0420 09/08/16 0412  PHART 7.446 7.460* 7.541*  PCO2ART 37.4 35.2 29.4*  PO2ART 105.0 118* 120.0*   Liver Enzymes  Recent Labs Lab 09/07/16 0414  09/08/16 0500 09/08/16 1600 09/09/16 0355  AST 148*  --  155*  --  98*  ALT 132*  --  152*  --  129*  ALKPHOS 230*  --  197*  --  155*  BILITOT 1.5*  --  2.1*  --  1.2  ALBUMIN 3.3*  < > 3.2* 3.1* 2.9*  < > = values in this interval not displayed.  Cardiac Enzymes No results  for input(s): TROPONINI, PROBNP in the last 168 hours.  Glucose  Recent Labs Lab 09/08/16 1230 09/08/16 1610 09/08/16 2024 09/09/16 0004 09/09/16 0351 09/09/16 0818  GLUCAP 245* 70 150* 188* 175* 174*   Imaging Dg Chest Port 1 View  Result Date: 09/09/2016 CLINICAL DATA:  Status post mitral valve replacement.  Hypertension. EXAM: PORTABLE CHEST 1 VIEW COMPARISON:  September 08, 2016 FINDINGS: Tracheostomy catheter tip is 6.8 cm above the carina. Left central catheter tip is at cavoatrial junction. Right jugular catheter tip is just superior to the cavoatrial junction in the superior vena cava. Feeding tube tip is below the diaphragm. Temporary pacemaker wires are attached to the right heart. No pneumothorax. There is mild atelectasis in the left base. Lungs elsewhere clear. Heart is mildly enlarged with pulmonary vascularity  within normal limits. Mitral valve replacement present. No adenopathy evident. IMPRESSION: Tube and catheter positions as described without pneumothorax. Stable cardiac prominence. Mild left base atelectasis. Lungs elsewhere clear. Electronically Signed   By: Lowella Grip III M.D.   On: 09/09/2016 08:19   Dg Abd Portable 1v  Result Date: 09/08/2016 CLINICAL DATA:  Initial evaluation for feeding tube placement. EXAM: PORTABLE ABDOMEN - 1 VIEW COMPARISON:  Prior radiograph from earlier the same day. FINDINGS: Feeding tube has been repositioned, and now follows the expected location of the duodenal sweep, with tip projecting near the ligament of Treitz. Visualized bowel gas pattern within normal limits. Cardiomegaly with prostatic valve and epicardial leads noted. Left basilar atelectasis noted. IMPRESSION: Feeding tube follows the expected course of the duodenal sweep, with tip projecting near the expected location of the ligament of Treitz. Electronically Signed   By: Jeannine Boga M.D.   On: 09/08/2016 17:42   Dg Abd Portable 1v  Result Date: 09/08/2016 CLINICAL DATA:  Feeding tube placement EXAM: PORTABLE ABDOMEN - 1 VIEW COMPARISON:  09/06/2016 FINDINGS: Coiled feeding tube in the stomach. Nonspecific bowel gas pattern similar to prior. Median sternotomy and mitral valvular repair. Paraspinal thin epicardial electrodes are seen bilaterally. IMPRESSION: Feeding tube appears to be coiled in the body of the stomach. Electronically Signed   By: Ashley Royalty M.D.   On: 09/08/2016 15:35   STUDIES:  LHC 9/12: PCI to OM1, severe MR, EF 55 to 60% RHC 9/15: RA 5, RV 37/3/6, PA 38/18/26, PCWP 15, CI 1.9, PVR 3.3 WU TEE 9/15: EF 60 to 65%, flail motion MV with severe MR RUQ ultrasound 9/23 > Gallbladder wall thickening noted, suggestion of sludge, small R effusion, liver unremarkable but left lobe not well visulalized  MICROBIOLOGY: MRSA PCR 9/12:  Negative MRSA PCR 9/15:  Negative Tracheal Asp  Ctx 9/15:  Strep agalactiae and enterobacter. Blood Ctx x2 9/15:  Negative  MRSA PCR 9/19:  Negative  Blood culture 9/24: coag neg staph 2/4  ANTIBIOTICS: Zosyn 9/16 - 9/17 Vancomycin 9/16 - 9/17 Rocephin 9/17 >> 9/24 Vancomycin 9/20 (surgical prophylaxis) Vanc 9/25 > Zosyn 9/25 > off  Fluconazole 9/25 > 10/5 Imipenem 10/4>>>  SIGNIFICANT EVENTS: 9/12 Admit to [facility], PCI to [place] 9/14 ETT, CRRT 9/15 to [facility] 9/16 off insulin gtt 9/20 To OR for Mitral Valve Repair w/ bioprosthetic valve 9/28 To OR for permcath 9/29 Extubated. 9/30 Reintubated 10/5 trach   LINES/TUBES: Right IJ PICC 9/12 - 9/20 Rt femoral introducer 9/15 - 9/20 OETT 8.0 9/14 >> 9/29, 9/30 >>10/5 HD cath left IJ 9/14 >> R IJ Introducer/CVL/Swan 9/20 >>  9/25 IABP 9/15 >>9/22 Lt femoral a line 9/15 >> 9/25 Y  Chest Tube/Mediastinal Chest Tube 9/20 >>out L brachial art line 9/25 > 9/28 R IJ CVL > 9/28 L IJ Permcath >  Perc trach (JY) 10/5>>>  ASSESSMENT / PLAN:  PULMONARY A: Acute Hypoxic Respiratory Failure - Secondary to acute pulmonary edema, reintubated 9/30 Aspiration risk Trach site bleeding - resolved.  P:   PS as tolerated Progress to ATC  Titrate O2 Continue to hold plavix given bleeding  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 Shock - Combination cardiogenic & septic Afib, S/P DC cardioversion on 10/2, currently in sinus  P:  Post-op Mgt per CVTS/Cardiology. Titrate levo gtt down as able  ASA, per cardiology/CVTS  Amio, Milrinone gtt's per cardiology/CVTS.  RENAL A:   Acute Renal Failure - Baseline creatinine 1.6-1.7 P:   Nephrology Following Keep even on CVVHD Monitor BMET and UOP Replace electrolytes as needed  GASTROINTESTINAL A:   Transaminitis - peaked 9/23, near complete resolution, unclear etiology,  Mild Malnutrition - Related to critical illness  P:   Pepcid IV q12hr Continue TF Intermittent f/u LFT's    HEMATOLOGIC A:   Anemia - Last transfusion 9/20 Leukocytosis - Multifactorial  P:  Trending cell counts w/ CBC SCDs  INFECTIOUS A:   Group B Strep Pneumonia Coag negative staph bacteremia (likely contaminant) Line changes on 9/28 Leucocytosis Enterobacter species - sputum  P:   Vanc, imipenem as above  Re-culture for fever. Diflucan off, cultures negative and LFTs elevated  ENDOCRINE A:   DM - Glucose controlled  P:   SSI  Levemir BID  NEUROLOGIC A:   New onset R sided hemiplegia and Preferental gaze to Right (9/25). Cranial CT scan was (-) for acute changes >> resolved without evidence of weakness on 9/29 Acute Encephalopathy - Multifactorial and likely combination toxic metabolic & hypoxia   P:   Follow neuro exam PRN fentanyl    Deborah Madrid, NP 09/09/2016  10:09 AM Pager: [phone] or [phone]  Attending Note:  I have examined patient, reviewed labs, studies and notes. I have discussed the case with Deborah [name], and I agree with the data and plans as amended above. 48 s/p CABG and MVR. Course c/b hemiplegia, renal failure, VDRF. now trached On my eval she is tolerating ATC, norepi has been weaned but is not off yet. Will push ATC as she can tolerate. Independent critical care time is 31 minutes.   Deborah Apo, MD, PhD 09/09/2016, 2:59 PM [facility] [phone] or if no answer [phone]

## 2016-09-09 NOTE — Progress Notes (Signed)
9 Days Post-Op Procedure(s) (LRB): INSERTION OF DIALYSIS CATHETER LEFT INTERNAL JUGULAR VEIN & INSERTION OF TRIPLE LUMEN RIGHT INTERNAL JUGULAR VEIN (Bilateral) Subjective:  bleeding from trach  Has stopped with heparin out of cvvh circuit  Hold heparin in CVVH   Objective: Vital signs in last 24 hours: Temp:  [97.3 F (36.3 C)-98.1 F (36.7 C)] 98.1 F (36.7 C) (10/07 0819) Pulse Rate:  [81-111] 106 (10/07 0900) Cardiac Rhythm: Atrial flutter (10/07 0800) Resp:  [20-32] 24 (10/07 0900) BP: (80-110)/(44-54) 80/44 (10/07 0858) SpO2:  [100 %] 100 % (10/07 0900) Arterial Line BP: (77-358)/(40-354) 78/44 (10/07 0900) FiO2 (%):  [40 %] 40 % (10/07 0858) Weight:  [141 lb 8.6 oz (64.2 kg)] 141 lb 8.6 oz (64.2 kg) (10/07 0500)  Hemodynamic parameters for last 24 hours: CVP:  [0 mmHg-10 mmHg] 9 mmHg  Intake/Output from previous day: 10/06 0701 - 10/07 0700 In: 2397.3 [I.V.:881.3; NG/GT:758; IV Piggyback:758] Out: 2706 [Stool:175] Intake/Output this shift: Total I/O In: 189.4 [I.V.:69.4; NG/GT:120] Out: 359 [Other:359]       Exam    General- alert and comfortable   Lungs- rhonchi bilaterial   Cor- regular rate and rhythm, no murmur , gallop, appears flutter 90-100   Abdomen- soft, non-tender   Extremities - warm, non-tender, minimal edema   Neuro- oriented, appropriate, no focal weakness   Lab Results:  Recent Labs  09/08/16 1635 09/09/16 0355  WBC 17.9* 16.5*  HGB 9.1* 8.7*  HCT 28.6* 28.4*  PLT 317 288   BMET:   Recent Labs  09/08/16 1600 09/09/16 0355  NA 135 135  K 3.4* 3.6  CL 100* 100*  CO2 23 23  GLUCOSE 70 185*  BUN 23* 25*  CREATININE 1.22* 1.28*  CALCIUM 8.8* 8.7*    PT/INR:   Recent Labs  09/06/16 1605  LABPROT 21.5*  INR 1.84   ABG    Component Value Date/Time   PHART 7.541 (H) 09/08/2016 0412   HCO3 25.2 09/08/2016 0412   TCO2 26 09/08/2016 0412   ACIDBASEDEF 4.1 (H) 09/02/2016 1850   O2SAT 62.9 09/09/2016 0350   CBG (last 3)    Recent Labs  09/09/16 0004 09/09/16 0351 09/09/16 0818  GLUCAP 188* 175* 174*    Assessment/Plan: S/P Procedure(s) (LRB): INSERTION OF DIALYSIS CATHETER LEFT INTERNAL JUGULAR VEIN & INSERTION OF TRIPLE LUMEN RIGHT INTERNAL JUGULAR VEIN (Bilateral)  heparin off, trach not bleeding but cvvh clotted twice last pm Wbc decreasing - does not appear septic Weaning off milrinone    OOB to chair  LOS: 22 days   Grace Isaac MD      [address] [place] [address] Office [phone]   [place]

## 2016-09-09 NOTE — Progress Notes (Signed)
Pharmacy Antibiotic Note  Deborah [name] is a 60 y.o. female who continues on antibiotics for enterobacter PNA.  Today is d#20 of total abx coverage.  Currently only on Imipenem.  Afebrile, WBC elevated but trending down.  Continues on CRRT.  Plan: Imipenem 500mg  Q8h (day #4) Follow-up stop date for antibiotics Follow-up transition to iHD as medication doses will likely change   Height: 5\' 1"  (154.9 cm) Weight: 141 lb 8.6 oz (64.2 kg) IBW/kg (Calculated) : 47.8  Temp (24hrs), Avg:97.6 F (36.4 C), Min:97.3 F (36.3 C), Max:98.1 F (36.7 C)   Recent Labs Lab 09/04/16 0915  09/07/16 0414 09/07/16 1600 09/07/16 1728 09/08/16 0500 09/08/16 1600 09/08/16 1635 09/09/16 0355  WBC  --   < > 17.8*  --  15.1* 16.4*  --  17.9* 16.5*  CREATININE  --   < > 1.29* 1.27*  --  1.20* 1.22*  --  1.28*  VANCOTROUGH 18  --   --   --   --   --   --   --   --   < > = values in this interval not displayed.  Estimated Creatinine Clearance: 40.6 mL/min (by C-G formula based on SCr of 1.28 mg/dL (H)).    No Known Allergies  Antimicrobials this admission: 9/16 Vanc >> 9/18, 9/20>>9/22, resume 9/25>>9/29  9/30>>10/7 9/16 Zosyn >> 9/18, resume 9/25>>9/29  9/30>>10/4 10/4 Imipenem >> 9/25 Fluconazole 9/27 restart 10/2>10/5 CTX 9/18>> 9/24  Dose adjustments this admission: 9/17 Vanc random >>16 (cont 1 gm q48h) 9/28 VT (on CVVHD) = 21, cont 1g q24 10/2 VT 18 on CVVHD on 750 q24  Microbiology results: 9/15 BCx: neg F 9/15 Sputum: mod group b strep/MSSA 9/15 MRSA PCR: negative 9/24 BCx2: 1/2 BCID CoNS likely contaminant 9/29 cdiff neg 9/30 BC x 2: neg F 10/1 TA -enterobacter S imip, cipro, R zosyn  Thank you for allowing pharmacy to be a part of this patient's care.  [employer], Pharm.D., BCPS Clinical Pharmacist Pager [phone] 09/09/2016 11:38 AM

## 2016-09-09 NOTE — Progress Notes (Signed)
Patient ID: Deborah [name], female   DOB: November 15, 1956, 60 y.o.   MRN: [id number]   60 y/o woman with DM2, HTN, HL and noncompliance presented to [facility] ER with infrerior posterior STEMI on 9/12. Taken to cath lab and found to have occluded small to moderate OM-1 branch. Underwent successful PCS with stent x 2 by Dr. Clayborn Bigness. Coronaries otherwise ok. EF 55-60% with severe MR confirmed by echo.   Post-cath developed respiratory failure and required intubation. Developed shock and renal failure with peak creatinine 3.4 (1.6 on admit). Transferred to [facility] for furthe management on 9/15.Marland Kitchen TEE showed normal LV (EF 60-65%) and normal RV function with ruptured posterior papillary muscle with severe posterior MR.  Underwent placement of Swan and IABP. S/P MVR 08/23/16.   Treated for group B Strep PNA.   Echo 9/23  RV and LV are normal EF 60%  Extubated 9/29.  However, picture consistent with septic shock on 9/30 so pressors started and she was re-intubated.  Developed AFL S/P TEE DC-CV 09/04/16    S/P Trach 10/5.  Sitting up in bed. Alert. Follows commands. Remains on milrinone 0.25 and norepi 4 mcg. Respiratory aspirate- enterobacter on imipenem.   CVVH keeping even. Now on trach collar.  Co-ox 63%   Scheduled Meds: . atorvastatin  20 mg Oral q1800  . chlorhexidine gluconate (MEDLINE KIT)  15 mL Mouth Rinse BID  . darbepoetin (ARANESP) injection - NON-DIALYSIS  100 mcg Subcutaneous Q Mon-1800  . famotidine  20 mg Oral Daily  . feeding supplement (NEPRO CARB STEADY)  1,000 mL Per Tube Q24H  . feeding supplement (PRO-STAT SUGAR FREE 64)  30 mL Per Tube 5 X Daily  . Gerhardt's butt cream   Topical BID  . imipenem-cilastatin  500 mg Intravenous Q8H  . insulin aspart  0-24 Units Subcutaneous Q4H  . insulin detemir  10 Units Subcutaneous BID  . mouth rinse  15 mL Mouth Rinse 10 times per day  . midodrine  5 mg Oral TID WC  . propofol  5-80 mcg/kg/min Intravenous Once  . sodium chloride flush  10-40 mL  Intracatheter Q12H  . sodium chloride flush  3 mL Intravenous Q12H   Continuous Infusions: . sodium chloride    . sodium chloride 10 mL (09/08/16 2036)  . amiodarone 30 mg/hr (09/09/16 1000)  . milrinone Stopped (09/09/16 1030)  . norepinephrine (LEVOPHED) Adult infusion 4 mcg/min (09/09/16 1000)  . dialysis replacement fluid (prismasate) 600 mL/hr at 09/09/16 0454  . dialysis replacement fluid (prismasate) 200 mL/hr at 09/08/16 2035  . dialysate (PRISMASATE) 1,000 mL/hr at 09/09/16 0639   PRN Meds:.Place/Maintain arterial line **AND** sodium chloride, fentaNYL (SUBLIMAZE) injection, HYDROcodone-acetaminophen, midazolam, ondansetron (ZOFRAN) IV, sodium chloride, sodium chloride flush, sodium chloride flush    Vitals:   09/09/16 0858 09/09/16 0900 09/09/16 1000 09/09/16 1031  BP: (!) 80/44     Pulse: 99 (!) 106 99 (!) 105  Resp: (!) 24 (!) 24 18 (!) 24  Temp:      TempSrc:      SpO2: 100% 100% 100% 100%  Weight:      Height:        Intake/Output Summary (Last 24 hours) at 09/09/16 1051 Last data filed at 09/09/16 1000  Gross per 24 hour  Intake           2415.9 ml  Output             2964 ml  Net           -  548.1 ml    LABS: Basic Metabolic Panel:  Recent Labs  09/08/16 0500 09/08/16 1600 09/09/16 0355  NA 135 135 135  K 3.4* 3.4* 3.6  CL 98* 100* 100*  CO2 24 23 23   GLUCOSE 98 70 185*  BUN 22* 23* 25*  CREATININE 1.20* 1.22* 1.28*  CALCIUM 9.1 8.8* 8.7*  MG 2.5*  --  2.6*  PHOS 2.2* 4.0 3.0   Liver Function Tests:  Recent Labs  09/08/16 0500 09/08/16 1600 09/09/16 0355  AST 155*  --  98*  ALT 152*  --  129*  ALKPHOS 197*  --  155*  BILITOT 2.1*  --  1.2  PROT 7.1  --  6.5  ALBUMIN 3.2* 3.1* 2.9*   No results for input(s): LIPASE, AMYLASE in the last 72 hours. CBC:  Recent Labs  09/08/16 1635 09/09/16 0355  WBC 17.9* 16.5*  HGB 9.1* 8.7*  HCT 28.6* 28.4*  MCV 92.9 93.4  PLT 317 288   Cardiac Enzymes: No results for input(s): CKTOTAL,  CKMB, CKMBINDEX, TROPONINI in the last 72 hours. BNP: Invalid input(s): POCBNP D-Dimer: No results for input(s): DDIMER in the last 72 hours. Hemoglobin A1C: No results for input(s): HGBA1C in the last 72 hours. Fasting Lipid Panel: No results for input(s): CHOL, HDL, LDLCALC, TRIG, CHOLHDL, LDLDIRECT in the last 72 hours. Thyroid Function Tests: No results for input(s): TSH, T4TOTAL, T3FREE, THYROIDAB in the last 72 hours.  Invalid input(s): FREET3 Anemia Panel: No results for input(s): VITAMINB12, FOLATE, FERRITIN, TIBC, IRON, RETICCTPCT in the last 72 hours.  PHYSICAL EXAM General: Sitting in bed Neck: RIJ TLC LIJ trialysis Trach Lungs: Clear anteriorly CV: Nondisplaced PMI.  Heart tachy, regular S1/S2, no S3/S4, 1/6 SEM USB.   Abdomen: Soft, ND, no HSM. No bruits or masses. +BS  Neurologic: Awake.   Extremities: No clubbing or cyanosis. R and LLE SCDs.   TELEMETRY: A flutter 90-100s.   Assessment:   1. Cardiogenic shock: Due to acute severe MR. EF 70% on 9/25 echo.  2. CAD: Inferior posterior STEMI on 9/13 with DES x 2 to OM-1      --Plavix restarted on 9/22 3. 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     --s/p MVR 08/23/2016  4. AKI on CKD stage III - due to ATN likely from cardiogenic shock + contrast with cath. Has required CVVH.  5. Acute respiratory failure: Pulmonary edema.  6. DM2 7. Suspect component of septic shock: Possible PNA, group B Strep in sputum culture.  8. Anemia 9. PVCs   10. Shock liver 11. MRSA bacteremia 12. Atrial fibrillation: Paroxysmal.  On amiodarone.     Plan/Discussion:    Continues to improve. S/p trach now on trach collar. Will stop milrinone. Continue to weaning norepi as tolerated. Will increase midodrine to 10 TID to facilitate. Remains in AFL. Continue amio. Can repeat DC-CV this week though she is off anticoag due to bleeding trach. Volume status looks  ok.  Continue PT and broad spectrum abx. Resume Plavix with recent stent.   Laurelyn Terrero,MD 09/09/2016  Advanced Heart Failure Team Pager [phone] (M-F; [place])  Please contact [facility] for night-coverage after hours (4p -7a ) and weekends on [email or web address]

## 2016-09-09 NOTE — Progress Notes (Signed)
Patient ID: Deborah [name], female   DOB: Apr 18, 1956, 60 y.o.   MRN: [id number] S:No complaints O:BP (!) 110/51   Pulse (!) 102   Temp 98.1 F (36.7 C) (Axillary)   Resp (!) 24   Ht 5' 1"  (1.549 m)   Wt 64.2 kg (141 lb 8.6 oz)   SpO2 100%   BMI 26.74 kg/m   Intake/Output Summary (Last 24 hours) at 09/09/16 0838 Last data filed at 09/09/16 0700  Gross per 24 hour  Intake          2278.08 ml  Output             2646 ml  Net          -367.92 ml   Intake/Output: I/O last 3 completed shifts: In: 3730.5 [I.V.:1349.5; Blood:335; NG/GT:1088; IV Piggyback:958] Out: 3903 [ESPQZ:3007; Stool:325]  Intake/Output this shift:  No intake/output data recorded. Weight change: 1.1 kg (2 lb 6.8 oz) Gen:WD WN AAF in NAD CVS: tachy, I/VI SEM at LUSB Resp:cta MAU:[id number] Ext:no edema   Recent Labs Lab 09/03/16 0330  09/04/16 0415  09/05/16 0405  09/06/16 0420 09/06/16 1605 09/07/16 0414 09/07/16 1600 09/08/16 0500 09/08/16 1600 09/09/16 0355  NA 137  < > 134*  < > 134*  < > 135 136 135 133* 135 135 135  K 3.5  < > 4.1  < > 3.6  < > 3.5 3.4* 3.5 3.6 3.4* 3.4* 3.6  CL 104  < > 102  < > 102  < > 102 101 99* 99* 98* 100* 100*  CO2 25  < > 23  < > 24  < > 24 25 23  21* 24 23 23   GLUCOSE 95  < > 211*  < > 147*  < > 146* 149* 105* 240* 98 70 185*  BUN 21*  < > 27*  < > 25*  < > 25* 19 19 21* 22* 23* 25*  CREATININE 1.21*  < > 1.33*  < > 1.34*  < > 1.30* 1.29* 1.29* 1.27* 1.20* 1.22* 1.28*  ALBUMIN 2.6*  < > 2.5*  < > 3.1*  < > 3.5 3.4* 3.3* 3.2* 3.2* 3.1* 2.9*  CALCIUM 8.1*  < > 8.0*  < > 8.5*  < > 8.7* 8.5* 8.7* 8.5* 9.1 8.8* 8.7*  PHOS 2.1*  < > 2.7  < > 2.1*  < > 2.1* 3.7 2.1* 4.0 2.2* 4.0 3.0  AST 42*  --  55*  --  66*  --  90*  --  148*  --  155*  --  98*  ALT 82*  --  78*  --  73*  --  86*  --  132*  --  152*  --  129*  < > = values in this interval not displayed. Liver Function Tests:  Recent Labs Lab 09/07/16 0414  09/08/16 0500 09/08/16 1600 09/09/16 0355  AST 148*  --  155*   --  98*  ALT 132*  --  152*  --  129*  ALKPHOS 230*  --  197*  --  155*  BILITOT 1.5*  --  2.1*  --  1.2  PROT 7.0  --  7.1  --  6.5  ALBUMIN 3.3*  < > 3.2* 3.1* 2.9*  < > = values in this interval not displayed. No results for input(s): LIPASE, AMYLASE in the last 168 hours.  Recent Labs Lab 09/02/16 1752  AMMONIA 23   CBC:  Recent Labs Lab 09/07/16 0414  09/07/16 1728 09/08/16 0500 09/08/16 1635 09/09/16 0355  WBC 17.8* 15.1* 16.4* 17.9* 16.5*  HGB 8.6* 8.4* 9.6* 9.1* 8.7*  HCT 27.5* 26.8* 30.2* 28.6* 28.4*  MCV 93.9 94.4 92.6 92.9 93.4  PLT 337 335 344 317 288   Cardiac Enzymes: No results for input(s): CKTOTAL, CKMB, CKMBINDEX, TROPONINI in the last 168 hours. CBG:  Recent Labs Lab 09/08/16 1610 09/08/16 2024 09/09/16 0004 09/09/16 0351 09/09/16 0818  GLUCAP 70 150* 188* 175* 174*    Iron Studies: No results for input(s): IRON, TIBC, TRANSFERRIN, FERRITIN in the last 72 hours. Studies/Results: Dg Chest Port 1 View  Result Date: 09/09/2016 CLINICAL DATA:  Status post mitral valve replacement.  Hypertension. EXAM: PORTABLE CHEST 1 VIEW COMPARISON:  September 08, 2016 FINDINGS: Tracheostomy catheter tip is 6.8 cm above the carina. Left central catheter tip is at cavoatrial junction. Right jugular catheter tip is just superior to the cavoatrial junction in the superior vena cava. Feeding tube tip is below the diaphragm. Temporary pacemaker wires are attached to the right heart. No pneumothorax. There is mild atelectasis in the left base. Lungs elsewhere clear. Heart is mildly enlarged with pulmonary vascularity within normal limits. Mitral valve replacement present. No adenopathy evident. IMPRESSION: Tube and catheter positions as described without pneumothorax. Stable cardiac prominence. Mild left base atelectasis. Lungs elsewhere clear. Electronically Signed   By: Lowella Grip III M.D.   On: 09/09/2016 08:19   Dg Chest Port 1 View  Result Date:  09/08/2016 CLINICAL DATA:  Tracheostomy.  CHF . EXAM: PORTABLE CHEST 1 VIEW COMPARISON:  09/07/2016. FINDINGS: Tracheostomy tube and feeding tube in stable position. Bilateral IJ catheters in stable position. Prior cardiac valve replacement. Cardiomegaly with bilateral from interstitial prominence and pleural effusions consistent congestive heart failure. Slight interim progression from prior exam. IMPRESSION: 1.  Lines and tubes in stable position. 2.Prior cardiac valve replacement. Cardiomegaly with bilateral from interstitial prominence consistent with pulmonary interstitial edema. Small bilateral pleural effusions. Findings consistent with congestive heart failure. These findings have progressed from prior exam. Electronically Signed   By: Kraemer   On: 09/08/2016 07:47   Dg Chest Port 1 View  Result Date: 09/07/2016 CLINICAL DATA:  New tracheostomy EXAM: PORTABLE CHEST 1 VIEW COMPARISON:  Portable exam 1257 hours compared to 09/07/2016 FINDINGS: Tracheostomy tube projects over tracheal air column. Feeding tube extends into stomach. LEFT jugular dual-lumen central venous catheter with tip projecting over high RIGHT atrium. Tip of RIGHT jugular line projects over SVC near cavoatrial junction. Epicardial pacing wires noted Enlargement of cardiac silhouette post median sternotomy and MVR. Mediastinal contours and pulmonary vascularity normal. Minimal bibasilar atelectasis. Remaining lungs clear. No pleural effusion or pneumothorax. IMPRESSION: New tracheostomy tube without acute abnormalities. Enlargement of cardiac silhouette post MVR. Electronically Signed   By: Lavonia Dana M.D.   On: 09/07/2016 13:12   Dg Abd Portable 1v  Result Date: 09/08/2016 CLINICAL DATA:  Initial evaluation for feeding tube placement. EXAM: PORTABLE ABDOMEN - 1 VIEW COMPARISON:  Prior radiograph from earlier the same day. FINDINGS: Feeding tube has been repositioned, and now follows the expected location of the duodenal  sweep, with tip projecting near the ligament of Treitz. Visualized bowel gas pattern within normal limits. Cardiomegaly with prostatic valve and epicardial leads noted. Left basilar atelectasis noted. IMPRESSION: Feeding tube follows the expected course of the duodenal sweep, with tip projecting near the expected location of the ligament of Treitz. Electronically Signed   By: Jeannine Boga M.D.   On:  09/08/2016 17:42   Dg Abd Portable 1v  Result Date: 09/08/2016 CLINICAL DATA:  Feeding tube placement EXAM: PORTABLE ABDOMEN - 1 VIEW COMPARISON:  09/06/2016 FINDINGS: Coiled feeding tube in the stomach. Nonspecific bowel gas pattern similar to prior. Median sternotomy and mitral valvular repair. Paraspinal thin epicardial electrodes are seen bilaterally. IMPRESSION: Feeding tube appears to be coiled in the body of the stomach. Electronically Signed   By: Ashley Royalty M.D.   On: 09/08/2016 15:35   . atorvastatin  20 mg Oral q1800  . chlorhexidine gluconate (MEDLINE KIT)  15 mL Mouth Rinse BID  . darbepoetin (ARANESP) injection - NON-DIALYSIS  100 mcg Subcutaneous Q Mon-1800  . famotidine  20 mg Oral Daily  . feeding supplement (NEPRO CARB STEADY)  1,000 mL Per Tube Q24H  . feeding supplement (PRO-STAT SUGAR FREE 64)  30 mL Per Tube 5 X Daily  . Gerhardt's butt cream   Topical BID  . imipenem-cilastatin  500 mg Intravenous Q8H  . insulin aspart  0-24 Units Subcutaneous Q4H  . insulin detemir  10 Units Subcutaneous BID  . mouth rinse  15 mL Mouth Rinse 10 times per day  . midodrine  5 mg Oral TID WC  . propofol  5-80 mcg/kg/min Intravenous Once  . sodium chloride flush  10-40 mL Intracatheter Q12H  . sodium chloride flush  3 mL Intravenous Q12H    BMET    Component Value Date/Time   NA 135 09/09/2016 0355   K 3.6 09/09/2016 0355   CL 100 (L) 09/09/2016 0355   CO2 23 09/09/2016 0355   GLUCOSE 185 (H) 09/09/2016 0355   BUN 25 (H) 09/09/2016 0355   CREATININE 1.28 (H) 09/09/2016 0355    CREATININE 1.41 (H) 10/04/2012 1631   CALCIUM 8.7 (L) 09/09/2016 0355   GFRNONAA 45 (L) 09/09/2016 0355   GFRAA 52 (L) 09/09/2016 0355   CBC    Component Value Date/Time   WBC 16.5 (H) 09/09/2016 0355   RBC 3.04 (L) 09/09/2016 0355   HGB 8.7 (L) 09/09/2016 0355   HCT 28.4 (L) 09/09/2016 0355   PLT 288 09/09/2016 0355   MCV 93.4 09/09/2016 0355   MCH 28.6 09/09/2016 0355   MCHC 30.6 09/09/2016 0355   RDW 19.5 (H) 09/09/2016 0355   LYMPHSABS 2.3 08/23/2016 0328   MONOABS 4.1 (H) 08/23/2016 0328   EOSABS 0.5 08/23/2016 0328   BASOSABS 0.0 08/23/2016 0328    Assessment/Plan:  1. AKI/CKD- has been dialysis dependent since 08/17/16 requiring CVVHD due to ongoing hypotension/cardiogenic shock.  No urine output and patient remaints on pressors. 2. CAD s/p STEMI with cardiogenic shock s/p stenting 3. 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 VDRF s/p trach- per PCCM 5. Anemia- on ESA and will need to transfuse prn (1 unit 09/05/16) 6. Vascular access- LIJ vascath placed 08/31/16.  Will likely need to be replaced with another vascath if infection is still a concern.  If not would recommend tunneled HD catheter placement.   [place]

## 2016-09-10 ENCOUNTER — Inpatient Hospital Stay (HOSPITAL_COMMUNITY): Payer: Medicaid Other

## 2016-09-10 LAB — GLUCOSE, CAPILLARY
GLUCOSE-CAPILLARY: 78 mg/dL (ref 65–99)
GLUCOSE-CAPILLARY: 152 mg/dL — AB (ref 65–99)
GLUCOSE-CAPILLARY: 153 mg/dL — AB (ref 65–99)
GLUCOSE-CAPILLARY: 126 mg/dL — AB (ref 65–99)
Glucose-Capillary: 135 mg/dL — ABNORMAL HIGH (ref 65–99)

## 2016-09-10 LAB — RENAL FUNCTION PANEL
ALBUMIN: 2.8 g/dL — AB (ref 3.5–5.0)
Anion gap: 8 (ref 5–15)
BUN: 32 mg/dL — AB (ref 6–20)
CO2: 23 mmol/L (ref 22–32)
CREATININE: 1.2 mg/dL — AB (ref 0.44–1.00)
Calcium: 8.6 mg/dL — ABNORMAL LOW (ref 8.9–10.3)
Chloride: 104 mmol/L (ref 101–111)
GFR calc Af Amer: 56 mL/min — ABNORMAL LOW (ref >60)
GFR, EST NON AFRICAN AMERICAN: 48 mL/min — AB (ref >60)
GLUCOSE: 143 mg/dL — AB (ref 65–99)
PHOSPHORUS: 2.6 mg/dL (ref 2.5–4.6)
POTASSIUM: 3.8 mmol/L (ref 3.5–5.1)
SODIUM: 135 mmol/L (ref 135–145)

## 2016-09-10 LAB — CARBOXYHEMOGLOBIN
CARBOXYHEMOGLOBIN: 0.9 % (ref 0.5–1.5)
Carboxyhemoglobin: 1.2 % (ref 0.5–1.5)
Carboxyhemoglobin: 0.7 % (ref 0.5–1.5)
Carboxyhemoglobin: 0.9 % (ref 0.5–1.5)
METHEMOGLOBIN: 0.9 % (ref 0.0–1.5)
METHEMOGLOBIN: 1 % (ref 0.0–1.5)
Methemoglobin: 1.1 % (ref 0.0–1.5)
Methemoglobin: 1.2 % (ref 0.0–1.5)
O2 SAT: 40 %
O2 SAT: 47.4 %
O2 Saturation: 42.5 %
O2 Saturation: 30 %
TOTAL HEMOGLOBIN: 9 g/dL — AB (ref 12.0–16.0)
TOTAL HEMOGLOBIN: 8.9 g/dL — AB (ref 12.0–16.0)
TOTAL HEMOGLOBIN: 8.8 g/dL — AB (ref 12.0–16.0)
Total hemoglobin: 9 g/dL — ABNORMAL LOW (ref 12.0–16.0)

## 2016-09-10 LAB — CBC
HCT: 28.4 % — ABNORMAL LOW (ref 36.0–46.0)
Hemoglobin: 8.8 g/dL — ABNORMAL LOW (ref 12.0–15.0)
MCH: 29.6 pg (ref 26.0–34.0)
MCHC: 31 g/dL (ref 30.0–36.0)
MCV: 95.6 fL (ref 78.0–100.0)
Platelets: 236 10*3/uL (ref 150–400)
RBC: 2.97 MIL/uL — ABNORMAL LOW (ref 3.87–5.11)
RDW: 19.9 % — ABNORMAL HIGH (ref 11.5–15.5)
WBC: 19.3 10*3/uL — ABNORMAL HIGH (ref 4.0–10.5)

## 2016-09-10 LAB — COMPREHENSIVE METABOLIC PANEL
ALT: 101 U/L — ABNORMAL HIGH (ref 14–54)
AST: 59 U/L — ABNORMAL HIGH (ref 15–41)
Albumin: 2.8 g/dL — ABNORMAL LOW (ref 3.5–5.0)
Alkaline Phosphatase: 149 U/L — ABNORMAL HIGH (ref 38–126)
Anion gap: 11 (ref 5–15)
BUN: 29 mg/dL — ABNORMAL HIGH (ref 6–20)
CO2: 24 mmol/L (ref 22–32)
Calcium: 8.6 mg/dL — ABNORMAL LOW (ref 8.9–10.3)
Chloride: 101 mmol/L (ref 101–111)
Creatinine, Ser: 1.23 mg/dL — ABNORMAL HIGH (ref 0.44–1.00)
GFR calc Af Amer: 55 mL/min — ABNORMAL LOW (ref >60)
GFR calc non Af Amer: 47 mL/min — ABNORMAL LOW (ref >60)
Glucose, Bld: 78 mg/dL (ref 65–99)
Potassium: 3.8 mmol/L (ref 3.5–5.1)
Sodium: 136 mmol/L (ref 135–145)
Total Bilirubin: 1 mg/dL (ref 0.3–1.2)
Total Protein: 7 g/dL (ref 6.5–8.1)

## 2016-09-10 LAB — PROTIME-INR
INR: 1.4
Prothrombin Time: 17.3 seconds — ABNORMAL HIGH (ref 11.4–15.2)

## 2016-09-10 LAB — MAGNESIUM: MAGNESIUM: 2.5 mg/dL — AB (ref 1.7–2.4)

## 2016-09-10 LAB — PHOSPHORUS: Phosphorus: 2.4 mg/dL — ABNORMAL LOW (ref 2.5–4.6)

## 2016-09-10 LAB — CG4 I-STAT (LACTIC ACID): LACTIC ACID, VENOUS: 0.89 mmol/L (ref 0.5–1.9)

## 2016-09-10 MED ORDER — ENOXAPARIN SODIUM 30 MG/0.3ML ~~LOC~~ SOLN
30.0000 mg | SUBCUTANEOUS | Status: DC
  Administered 2016-09-10: 30 mg via SUBCUTANEOUS
  Filled 2016-09-10: qty 0.3

## 2016-09-10 MED ORDER — PRISMASOL BGK 4/2.5 32-4-2.5 MEQ/L IV SOLN
INTRAVENOUS | Status: DC
  Administered 2016-09-10 – 2016-09-11 (×4): via INTRAVENOUS_CENTRAL
  Filled 2016-09-10 (×12): qty 5000

## 2016-09-10 MED ORDER — PRISMASOL BGK 4/2.5 32-4-2.5 MEQ/L IV SOLN
INTRAVENOUS | Status: DC
  Administered 2016-09-10 – 2016-09-11 (×2): via INTRAVENOUS_CENTRAL
  Filled 2016-09-10 (×7): qty 5000

## 2016-09-10 MED ORDER — AMIODARONE HCL 200 MG PO TABS
200.0000 mg | ORAL_TABLET | Freq: Two times a day (BID) | ORAL | Status: DC
  Administered 2016-09-10 – 2016-09-15 (×12): 200 mg via ORAL
  Filled 2016-09-10 (×14): qty 1

## 2016-09-10 MED ORDER — MILRINONE LACTATE IN DEXTROSE 20-5 MG/100ML-% IV SOLN
0.2500 ug/kg/min | INTRAVENOUS | Status: DC
  Administered 2016-09-10 – 2016-09-14 (×5): 0.25 ug/kg/min via INTRAVENOUS
  Filled 2016-09-10 (×5): qty 100

## 2016-09-10 NOTE — Progress Notes (Signed)
PULMONARY / CRITICAL CARE MEDICINE   Name: Deborah [name] MRN: [id number] DOB: 02-16-1956    ADMISSION DATE:  08/18/2016 CONSULTATION DATE:  08/18/2016  REFERRING MD:  Bensimhon   CHIEF COMPLAINT:  Ventilator management and critical care services   HISTORY OF PRESENT ILLNESS:   60 yo female admitted to [facility] 08/15/16 with STEMI s/p cath to [place].  Developed cardiogenic shock, VDRF (ETT 9/14), AKI with metabolic acidosis and started on CRRT 08/17/16.  Transferred to [facility] to tx shock and evaluated new severe MR from papillary muscle rupture.  She also had fever and found to have Group B Strep in sputum culture. Mitral valve replacement 9/20.  SUBJECTIVE:  Tol ATC.  Off levo and milrinone but co-ox low this am.  Remains on CVVHD  VITAL SIGNS: BP (!) 103/47   Pulse 66   Temp 97.5 F (36.4 C) (Axillary)   Resp (!) 24   Ht 5\' 1"  (1.549 m)   Wt 63.5 kg (139 lb 15.9 oz)   SpO2 100%   BMI 26.45 kg/m   HEMODYNAMICS: CVP:  [7 mmHg-11 mmHg] 9 mmHg  VENTILATOR SETTINGS: Vent Mode: PRVC FiO2 (%):  [35 %-40 %] 35 % Set Rate:  [24 bmp] 24 bmp Vt Set:  [420 mL] 420 mL PEEP:  [5 cmH20] 5 cmH20 Plateau Pressure:  [13 cmH20-16 cmH20] 13 cmH20  INTAKE / OUTPUT: I/O last 3 completed shifts: In: 3146.6 [I.V.:1146.6; NG/GT:1500; IV Piggyback:500] Out: 3875 [IEPPI:9518; Stool:600]  PHYSICAL EXAMINATION: General: NAD, moves spont, watching TV  HENT: Trach in place, [place]/AT, PERRL, EOM-I and MMM PULM: resps even non labored on ATC . No further bleeding from trach. Decreased BS CV: RRR, systolic murmur noted GI: BS+, soft, nontender MSK: normal bulk and tone. Gr 2 edema Neuro: awake, alert, follows commands, nods appropriately, MAE, gen weakness   LABS:  BMET  Recent Labs Lab 09/09/16 0355 09/09/16 1640 09/10/16 0457  NA 135 136 136  K 3.6 3.7 3.8  CL 100* 103 101  CO2 23 24 24   BUN 25* 26* 29*  CREATININE 1.28* 1.31* 1.23*  GLUCOSE 185* 102* 78    Electrolytes  Recent Labs Lab  09/08/16 0500  09/09/16 0355 09/09/16 1640 09/10/16 0457  CALCIUM 9.1  < > 8.7* 8.8* 8.6*  MG 2.5*  --  2.6*  --  2.5*  PHOS 2.2*  < > 3.0 2.2* 2.4*  < > = values in this interval not displayed.  CBC  Recent Labs Lab 09/08/16 1635 09/09/16 0355 09/10/16 0457  WBC 17.9* 16.5* 19.3*  HGB 9.1* 8.7* 8.8*  HCT 28.6* 28.4* 28.4*  PLT 317 288 236   Coag's  Recent Labs Lab 09/04/16 1130 09/06/16 1605 09/10/16 0457  INR 1.92 1.84 1.40   Sepsis Markers  Recent Labs Lab 09/04/16 0415 09/10/16 0736  LATICACIDVEN  --  0.89  PROCALCITON 33.02  --    ABG  Recent Labs Lab 09/06/16 0425 09/07/16 0420 09/08/16 0412  PHART 7.446 7.460* 7.541*  PCO2ART 37.4 35.2 29.4*  PO2ART 105.0 118* 120.0*   Liver Enzymes  Recent Labs Lab 09/08/16 0500  09/09/16 0355 09/09/16 1640 09/10/16 0457  AST 155*  --  98*  --  59*  ALT 152*  --  129*  --  101*  ALKPHOS 197*  --  155*  --  149*  BILITOT 2.1*  --  1.2  --  1.0  ALBUMIN 3.2*  < > 2.9* 2.8* 2.8*  < > = values in this interval  not displayed.  Cardiac Enzymes No results for input(s): TROPONINI, PROBNP in the last 168 hours.  Glucose  Recent Labs Lab 09/09/16 1133 09/09/16 1556 09/09/16 2022 09/09/16 2349 09/10/16 0422 09/10/16 0808  GLUCAP 224* 66 151* 182* 78 152*   Imaging Dg Chest Port 1 View  Result Date: 09/10/2016 CLINICAL DATA:  Hypoxia EXAM: PORTABLE CHEST 1 VIEW COMPARISON:  September 09, 2016 FINDINGS: Tracheostomy catheter tip is 6.0 cm above the carina. Central catheters have tips in the superior vena cava with the dual-lumen catheter tip at the cavoatrial junction. Feeding tube tip is below the diaphragm. No pneumothorax. There is atelectatic change in the left base. There is a probable small pleural effusion. Heart is mildly enlarged with pulmonary vascular within normal limits. There is a mitral valve replacement present. No adenopathy evident. IMPRESSION: Tube and catheter positions as described  without pneumothorax. Stable left base atelectasis. Suspect small right pleural effusion. Stable cardiomegaly. Electronically Signed   By: Lowella Grip III M.D.   On: 09/10/2016 08:45   STUDIES:  LHC 9/12: PCI to OM1, severe MR, EF 55 to 60% RHC 9/15: RA 5, RV 37/3/6, PA 38/18/26, PCWP 15, CI 1.9, PVR 3.3 WU TEE 9/15: EF 60 to 65%, flail motion MV with severe MR RUQ ultrasound 9/23 > Gallbladder wall thickening noted, suggestion of sludge, small R effusion, liver unremarkable but left lobe not well visulalized  MICROBIOLOGY: MRSA PCR 9/12:  Negative MRSA PCR 9/15:  Negative Tracheal Asp Ctx 9/15:  Strep agalactiae and enterobacter. Blood Ctx x2 9/15:  Negative  MRSA PCR 9/19:  Negative  Blood culture 9/24: coag neg staph 2/4  ANTIBIOTICS: Zosyn 9/16 - 9/17 Vancomycin 9/16 - 9/17 Rocephin 9/17 >> 9/24 Vancomycin 9/20 (surgical prophylaxis) Vanc 9/25 >off Zosyn 9/25 > off  Fluconazole 9/25 > 10/5 Imipenem 10/4>>>  SIGNIFICANT EVENTS: 9/12 Admit to [facility], PCI to [place] 9/14 ETT, CRRT 9/15 to [facility] 9/16 off insulin gtt 9/20 To OR for Mitral Valve Repair w/ bioprosthetic valve 9/28 To OR for permcath 9/29 Extubated. 9/30 Reintubated 10/5 trach   LINES/TUBES: Right IJ PICC 9/12 - 9/20 Rt femoral introducer 9/15 - 9/20 OETT 8.0 9/14 >> 9/29, 9/30 >>10/5 HD cath left IJ 9/14 >> R IJ Introducer/CVL/[place] 9/20 >>  9/25 IABP 9/15 >>9/22 Lt femoral a line 9/15 >> 9/25 Y Chest Tube/Mediastinal Chest Tube 9/20 >>out L brachial art line 9/25 > 9/28 R IJ CVL > 9/28 L IJ Permcath >  Perc trach (JY) 10/5>>>  ASSESSMENT / PLAN:  PULMONARY A: Acute Hypoxic Respiratory Failure - Secondary to acute pulmonary edema, reintubated 9/30 Aspiration risk Trach site bleeding - resolved.  P:   Cont ATC daytime - would rest on full support qhs for now while remains acutely ill, on CVVH/pressors, etc Titrate O2 Continue to hold plavix given bleeding - consider resume 10/9 if remains stable    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 Shock - Combination cardiogenic & septic Afib, S/P DC cardioversion on 10/2, currently in sinus  P:  Post-op Mgt per CVTS/Cardiology. Levophed, milrinone off 10/8 ASA, per cardiology/CVTS  Amio gtt per cardiology/CVTS.  RENAL A:   Acute Renal Failure - Baseline creatinine 1.6-1.7 P:   Nephrology Following Keep even on CVVHD Monitor BMET and UOP Replace electrolytes as needed  GASTROINTESTINAL A:   Transaminitis - peaked 9/23, near complete resolution, unclear etiology,  Mild Malnutrition - Related to critical illness  P:   Pepcid IV q12hr Continue TF  Intermittent f/u LFT's   HEMATOLOGIC A:   Anemia - Last transfusion 9/20 Leukocytosis - Multifactorial  P:  Trending cell counts w/ CBC SCDs  INFECTIOUS A:   Group B Strep Pneumonia Coag negative staph bacteremia (likely contaminant) Line changes on 9/28 Leucocytosis Enterobacter species - sputum  P:   imipenem as above  Re-culture for fever. Diflucan off, cultures negative and LFTs elevated  ENDOCRINE A:   DM - Glucose controlled  P:   SSI  Levemir BID  NEUROLOGIC A:   New onset R sided hemiplegia and Preferental gaze to Right (9/25). Cranial CT scan was (-) for acute changes >> resolved without evidence of weakness on 9/29 Acute Encephalopathy - Multifactorial and likely combination toxic metabolic & hypoxia   P:   Follow neuro exam PRN fentanyl    [name], NP 09/10/2016  9:30 AM Pager: [phone] or [phone]   Attending Note:  I have examined patient, reviewed labs, studies and notes. I have discussed the case with Shon Millet, and I agree with the data and plans as amended above. 32 s/p CABG and MVR. Course c/b hemiplegia, renal failure, VDRF. Now trached. She was up to chair and on ATC most of the day today 10/8.  On my eval she is now back in bed on PRVC.  Will push ATC as she  can tolerate, increase the duration off the vent systematically over the next several days if she can tolerate.   Baltazar Apo, MD, PhD 09/10/2016, 5:53 PM [facility] [phone] or if no answer [phone]

## 2016-09-10 NOTE — Progress Notes (Signed)
RT called by RN to place pt back on vent due to pt feeling tired.  RT will continue to monitor.

## 2016-09-10 NOTE — Progress Notes (Signed)
Patient ID: Deborah [name], female   DOB: 09-Apr-1956, 60 y.o.   MRN: [id number] Days Post-Op Procedure(s) (LRB): INSERTION OF DIALYSIS CATHETER LEFT INTERNAL JUGULAR VEIN & INSERTION OF TRIPLE LUMEN RIGHT INTERNAL JUGULAR VEIN (Bilateral)  9/20 MITRAL VALVE REPAIR (MVR) USING 25MM EDWARDS MAGNA EASE BIOPROSTHESIS MITRAL  VALVE (N/A) INTRAOPERATIVE TRANSESOPHAGEAL ECHOCARDIOGRAM (N/A) ENDOVEIN HARVEST OF GREATER SAPHENOUS VEIN (Left) CENTRAL LINE INSERTION RIGHT SUBCLAVIAN (Right)    Subjective:  bleeding from trach  Has stopped  Off milrinone and levophed   Objective: Vital signs in last 24 hours: Temp:  [97.5 F (36.4 C)-98.1 F (36.7 C)] 97.5 F (36.4 C) (10/08 0810) Pulse Rate:  [61-106] 65 (10/08 0840) Cardiac Rhythm: Normal sinus rhythm (10/08 0700) Resp:  [18-32] 23 (10/08 0840) BP: (80-128)/(44-58) 103/47 (10/08 0840) SpO2:  [82 %-100 %] 100 % (10/08 0840) Arterial Line BP: (78-163)/(44-71) 122/53 (10/08 0800) FiO2 (%):  [35 %-40 %] 35 % (10/08 0840) Weight:  [139 lb 15.9 oz (63.5 kg)] 139 lb 15.9 oz (63.5 kg) (10/08 0500)  Hemodynamic parameters for last 24 hours: CVP:  [7 mmHg-11 mmHg] 11 mmHg  Intake/Output from previous day: 10/07 0701 - 10/08 0700 In: 2081.1 [I.V.:701.1; NG/GT:1080; IV Piggyback:300] Out: 2686 [Stool:400] Intake/Output this shift: Total I/O In: -  Out: 232 [Other:232]       Exam   General- alert and comfortable   Lungs- rhonchi bilaterial on trach collar   Cor- regular rate and rhythm, no murmur , gallop, appears flutter 90-100   Abdomen- soft, non-tender   Extremities - warm, non-tender, minimal edema   Neuro- oriented, appropriate, no focal weakness   Lab Results:  Recent Labs  09/09/16 0355 09/10/16 0457  WBC 16.5* 19.3*  HGB 8.7* 8.8*  HCT 28.4* 28.4*  PLT 288 236   BMET:   Recent Labs  09/09/16 1640 09/10/16 0457  NA 136 136  K 3.7 3.8  CL 103 101  CO2 24 24  GLUCOSE 102* 78  BUN 26* 29*  CREATININE 1.31*  1.23*  CALCIUM 8.8* 8.6*    PT/INR:   Recent Labs  09/10/16 0457  LABPROT 17.3*  INR 1.40   ABG    Component Value Date/Time   PHART 7.541 (H) 09/08/2016 0412   HCO3 25.2 09/08/2016 0412   TCO2 26 09/08/2016 0412   ACIDBASEDEF 4.1 (H) 09/02/2016 1850   O2SAT 40.0 09/10/2016 0730   CBG (last 3)   Recent Labs  09/09/16 2349 09/10/16 0422 09/10/16 0808  GLUCAP 182* 78 152*     Assessment/Plan: S/P Procedure(s) (LRB): INSERTION OF DIALYSIS CATHETER LEFT INTERNAL JUGULAR VEIN & INSERTION OF TRIPLE LUMEN RIGHT INTERNAL JUGULAR VEIN (Bilateral)   9/20 MITRAL VALVE REPAIR (MVR) USING 25MM EDWARDS MAGNA EASE BIOPROSTHESIS MITRAL  VALVE (N/A) INTRAOPERATIVE TRANSESOPHAGEAL ECHOCARDIOGRAM (N/A) ENDOVEIN HARVEST OF GREATER SAPHENOUS VEIN (Left) CENTRAL LINE INSERTION RIGHT SUBCLAVIAN (Right)  Wbc increased to 19,000 - does not appear septic Weaning off milrinone  And levaphed ,cox dropped to  42  Tolerating increasing time off vent  OOB to chair  LOS: 23 days   Grace Isaac MD      [place] [place] [address] Office [phone]   [place]

## 2016-09-10 NOTE — Progress Notes (Signed)
Dr. Lamona Curl with cardiology made aware of co-ox 42.9 this morning. Milrinone and Levophed have been turned off since yesterday afternoon. Vital signs stable at this time. Will recollect co-ox later this morning. Will continue to closely monitor. Richarda Blade RN

## 2016-09-10 NOTE — Progress Notes (Addendum)
Patient ID: Deborah [name], female   DOB: 1956/05/04, 60 y.o.   MRN: [id number] S:sitting in chair, no complaints O:BP (!) 103/47   Pulse 65   Temp 97.5 F (36.4 C) (Axillary)   Resp (!) 23   Ht _0  (1.549 m)   Wt 63.5 kg (139 lb 15.9 oz)   SpO2 100%   BMI 26.45 kg/m   Intake/Output Summary (Last 24 hours) at 09/10/16 0910 Last data filed at 09/10/16 0900  Gross per 24 hour  Intake           1971.7 ml  Output             2559 ml  Net           -587.3 ml   Intake/Output: I/O last 3 completed shifts: In: 3146.6 [I.V.:1146.6; NG/GT:1500; IV Piggyback:500] Out: 1017 [PZWCH:8527; Stool:600]  Intake/Output this shift:  Total I/O In: 80 [I.V.:20; NG/GT:60] Out: 232 [Other:232] Weight change: -0.7 kg (-1 lb 8.7 oz) Gen:WD AAF with trach CVS:no rub Resp:cta POE:UMPNTI Ext: no edema   Recent Labs Lab 09/04/16 0415  09/05/16 0405  09/06/16 0420  09/07/16 0414 09/07/16 1600 09/08/16 0500 09/08/16 1600 09/09/16 0355 09/09/16 1640 09/10/16 0457  NA 134*  < > 134*  < > 135  < > 135 133* 135 135 135 136 136  K 4.1  < > 3.6  < > 3.5  < > 3.5 3.6 3.4* 3.4* 3.6 3.7 3.8  CL 102  < > 102  < > 102  < > 99* 99* 98* 100* 100* 103 101  CO2 23  < > 24  < > 24  < > 23 21* _1 GLUCOSE 211*  < > 147*  < > 146*  < > 105* 240* 98 70 185* 102* 78  BUN 27*  < > 25*  < > 25*  < > 19 21* 22* 23* 25* 26* 29*  CREATININE 1.33*  < > 1.34*  < > 1.30*  < > 1.29* 1.27* 1.20* 1.22* 1.28* 1.31* 1.23*  ALBUMIN 2.5*  < > 3.1*  < > 3.5  < > 3.3* 3.2* 3.2* 3.1* 2.9* 2.8* 2.8*  CALCIUM 8.0*  < > 8.5*  < > 8.7*  < > 8.7* 8.5* 9.1 8.8* 8.7* 8.8* 8.6*  PHOS 2.7  < > 2.1*  < > 2.1*  < > 2.1* 4.0 2.2* 4.0 3.0 2.2* 2.4*  AST 55*  --  66*  --  90*  --  148*  --  155*  --  98*  --  59*  ALT 78*  --  73*  --  86*  --  132*  --  152*  --  129*  --  101*  < > = values in this interval not displayed. Liver Function Tests:  Recent Labs Lab 09/08/16 0500  09/09/16 0355 09/09/16 1640 09/10/16 0457   AST 155*  --  98*  --  59*  ALT 152*  --  129*  --  101*  ALKPHOS 197*  --  155*  --  149*  BILITOT 2.1*  --  1.2  --  1.0  PROT 7.1  --  6.5  --  7.0  ALBUMIN 3.2*  < > 2.9* 2.8* 2.8*  < > = values in this interval not displayed. No results for input(s): LIPASE, AMYLASE in the last 168 hours. No results for input(s): AMMONIA in the last 168 hours. CBC:  Recent Labs  Lab 09/07/16 1728 09/08/16 0500 09/08/16 1635 09/09/16 0355 09/10/16 0457  WBC 15.1* 16.4* 17.9* 16.5* 19.3*  HGB 8.4* 9.6* 9.1* 8.7* 8.8*  HCT 26.8* 30.2* 28.6* 28.4* 28.4*  MCV 94.4 92.6 92.9 93.4 95.6  PLT 335 344 317 288 236   Cardiac Enzymes: No results for input(s): CKTOTAL, CKMB, CKMBINDEX, TROPONINI in the last 168 hours. CBG:  Recent Labs Lab 09/09/16 1556 09/09/16 2022 09/09/16 2349 09/10/16 0422 09/10/16 0808  GLUCAP 66 151* 182* 78 152*    Iron Studies: No results for input(s): IRON, TIBC, TRANSFERRIN, FERRITIN in the last 72 hours. Studies/Results: Dg Chest Port 1 View  Result Date: 09/10/2016 CLINICAL DATA:  Hypoxia EXAM: PORTABLE CHEST 1 VIEW COMPARISON:  September 09, 2016 FINDINGS: Tracheostomy catheter tip is 6.0 cm above the carina. Central catheters have tips in the superior vena cava with the dual-lumen catheter tip at the cavoatrial junction. Feeding tube tip is below the diaphragm. No pneumothorax. There is atelectatic change in the left base. There is a probable small pleural effusion. Heart is mildly enlarged with pulmonary vascular within normal limits. There is a mitral valve replacement present. No adenopathy evident. IMPRESSION: Tube and catheter positions as described without pneumothorax. Stable left base atelectasis. Suspect small right pleural effusion. Stable cardiomegaly. Electronically Signed   By: Lowella Grip III M.D.   On: 09/10/2016 08:45   Dg Chest Port 1 View  Result Date: 09/09/2016 CLINICAL DATA:  Status post mitral valve replacement.  Hypertension. EXAM:  PORTABLE CHEST 1 VIEW COMPARISON:  September 08, 2016 FINDINGS: Tracheostomy catheter tip is 6.8 cm above the carina. Left central catheter tip is at cavoatrial junction. Right jugular catheter tip is just superior to the cavoatrial junction in the superior vena cava. Feeding tube tip is below the diaphragm. Temporary pacemaker wires are attached to the right heart. No pneumothorax. There is mild atelectasis in the left base. Lungs elsewhere clear. Heart is mildly enlarged with pulmonary vascularity within normal limits. Mitral valve replacement present. No adenopathy evident. IMPRESSION: Tube and catheter positions as described without pneumothorax. Stable cardiac prominence. Mild left base atelectasis. Lungs elsewhere clear. Electronically Signed   By: Lowella Grip III M.D.   On: 09/09/2016 08:19   Dg Abd Portable 1v  Result Date: 09/08/2016 CLINICAL DATA:  Initial evaluation for feeding tube placement. EXAM: PORTABLE ABDOMEN - 1 VIEW COMPARISON:  Prior radiograph from earlier the same day. FINDINGS: Feeding tube has been repositioned, and now follows the expected location of the duodenal sweep, with tip projecting near the ligament of Treitz. Visualized bowel gas pattern within normal limits. Cardiomegaly with prostatic valve and epicardial leads noted. Left basilar atelectasis noted. IMPRESSION: Feeding tube follows the expected course of the duodenal sweep, with tip projecting near the expected location of the ligament of Treitz. Electronically Signed   By: Jeannine Boga M.D.   On: 09/08/2016 17:42   Dg Abd Portable 1v  Result Date: 09/08/2016 CLINICAL DATA:  Feeding tube placement EXAM: PORTABLE ABDOMEN - 1 VIEW COMPARISON:  09/06/2016 FINDINGS: Coiled feeding tube in the stomach. Nonspecific bowel gas pattern similar to prior. Median sternotomy and mitral valvular repair. Paraspinal thin epicardial electrodes are seen bilaterally. IMPRESSION: Feeding tube appears to be coiled in the body of  the stomach. Electronically Signed   By: Ashley Royalty M.D.   On: 09/08/2016 15:35   . atorvastatin  20 mg Oral q1800  . chlorhexidine gluconate (MEDLINE KIT)  15 mL Mouth Rinse BID  . darbepoetin (ARANESP) injection -  NON-DIALYSIS  100 mcg Subcutaneous Q Mon-1800  . famotidine  20 mg Oral Daily  . feeding supplement (NEPRO CARB STEADY)  1,000 mL Per Tube Q24H  . feeding supplement (PRO-STAT SUGAR FREE 64)  30 mL Per Tube 5 X Daily  . Gerhardt's butt cream   Topical BID  . imipenem-cilastatin  500 mg Intravenous Q8H  . insulin aspart  0-24 Units Subcutaneous Q4H  . insulin detemir  10 Units Subcutaneous BID  . mouth rinse  15 mL Mouth Rinse 10 times per day  . midodrine  10 mg Oral TID WC  . propofol  5-80 mcg/kg/min Intravenous Once  . sodium chloride flush  10-40 mL Intracatheter Q12H  . sodium chloride flush  3 mL Intravenous Q12H    BMET    Component Value Date/Time   NA 136 09/10/2016 0457   K 3.8 09/10/2016 0457   CL 101 09/10/2016 0457   CO2 24 09/10/2016 0457   GLUCOSE 78 09/10/2016 0457   BUN 29 (H) 09/10/2016 0457   CREATININE 1.23 (H) 09/10/2016 0457   CREATININE 1.41 (H) 10/04/2012 1631   CALCIUM 8.6 (L) 09/10/2016 0457   GFRNONAA 47 (L) 09/10/2016 0457   GFRAA 55 (L) 09/10/2016 0457   CBC    Component Value Date/Time   WBC 19.3 (H) 09/10/2016 0457   RBC 2.97 (L) 09/10/2016 0457   HGB 8.8 (L) 09/10/2016 0457   HCT 28.4 (L) 09/10/2016 0457   PLT 236 09/10/2016 0457   MCV 95.6 09/10/2016 0457   MCH 29.6 09/10/2016 0457   MCHC 31.0 09/10/2016 0457   RDW 19.9 (H) 09/10/2016 0457   LYMPHSABS 2.3 08/23/2016 0328   MONOABS 4.1 (H) 08/23/2016 0328   EOSABS 0.5 08/23/2016 0328   BASOSABS 0.0 08/23/2016 0328     Assessment/Plan:  1. AKI/CKD- has been dialysis dependent since 08/17/16 requiring CVVHD due to ongoing hypotension/cardiogenic shock.   1. No urine output  2. Now off of pressors since 5pm 09/09/16 3. If CVVHD system clots would hold CVVHD and will  try to transition to IHD now that she is off of levophed and milrinone. 2. CAD s/p STEMI with cardiogenic shock s/p stenting 3. 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 VDRF s/p trach- per PCCM 5. Anemia- on ESA and will need to transfuse prn (1 unit 09/05/16) 6. Disposition- likely need for LTC facility that will accept HD patient with trach 7. Vascular access- LIJ tunneled HD cath placed 08/31/16 by Dr. Scot Dock.   [place]

## 2016-09-10 NOTE — Progress Notes (Signed)
Patient ID: Deborah [name], female   DOB: [birth year], 60 y.o.   MRN: [id number] EVENING ROUNDS NOTE :     [address]       [place] [address]             [phone]                 10 Days Post-Op Procedure(s) (LRB): INSERTION OF DIALYSIS CATHETER LEFT INTERNAL JUGULAR VEIN & INSERTION OF TRIPLE LUMEN RIGHT INTERNAL JUGULAR VEIN (Bilateral)  Total Length of Stay:  LOS: 23 days  BP 135/65   Pulse (!) 123   Temp (!) 96.1 F (35.6 C) (Axillary)   Resp (!) 22   Ht 5\' 1"  (1.549 m)   Wt 139 lb 15.9 oz (63.5 kg)   SpO2 100%   BMI 26.45 kg/m   .Intake/Output      10/08 0701 - 10/09 0700   I.V. (mL/kg) 246.5 (3.9)   NG/GT 630   IV Piggyback 100   Total Intake(mL/kg) 976.5 (15.4)   Urine (mL/kg/hr) 0 (0)   Emesis/NG output 0 (0)   Other 1290 (1.7)   Stool 0 (0)   Total Output 1290   Net -313.5       Urine Occurrence 0 x     . sodium chloride    . sodium chloride 10 mL/hr at 09/10/16 1900  . milrinone 0.25 mcg/kg/min (09/10/16 1900)  . norepinephrine (LEVOPHED) Adult infusion Stopped (09/09/16 1700)  . dialysis replacement fluid (prismasate) 200 mL/hr at 09/09/16 2226  . dialysis replacement fluid (prismasate) 600 mL/hr at 09/10/16 1603  . dialysate (PRISMASATE) 1,000 mL/hr at 09/10/16 1436     Lab Results  Component Value Date   WBC 19.3 (H) 09/10/2016   HGB 8.8 (L) 09/10/2016   HCT 28.4 (L) 09/10/2016   PLT 236 09/10/2016   GLUCOSE 143 (H) 09/10/2016   CHOL 207 (H) 07/09/2014   TRIG 174.0 (H) 07/09/2014   HDL 40.60 07/09/2014   LDLCALC 132 (H) 07/09/2014   ALT 101 (H) 09/10/2016   AST 59 (H) 09/10/2016   NA 135 09/10/2016   K 3.8 09/10/2016   CL 104 09/10/2016   CREATININE 1.20 (H) 09/10/2016   BUN 32 (H) 09/10/2016   CO2 23 09/10/2016   TSH 1.485 Test methodology is 3rd generation TSH 01/26/2010   INR 1.40 09/10/2016   HGBA1C 13.3 (H) 08/15/2016   MICROALBUR 15.8 (H) 07/09/2014   Noon  COX  lower then this am, back on milrinone now  cox 30 back to [place] MD  Beeper [phone] Office [phone] 09/10/2016 7:19 PM

## 2016-09-10 NOTE — Progress Notes (Signed)
Patient ID: Deborah [name], female   DOB: September 02, 1956, 60 y.o.   MRN: [id number]   ADVANCED HF TEAM  60 y/o woman with DM2, HTN, HL and noncompliance presented to [facility] ER with infrerior posterior STEMI on 9/12. Taken to cath lab and found to have occluded small to moderate OM-1 branch. Underwent successful PCS with stent x 2 by Dr. Clayborn Bigness. Coronaries otherwise ok. EF 55-60% with severe MR confirmed by echo.   Post-cath developed respiratory failure and required intubation. Developed shock and renal failure with peak creatinine 3.4 (1.6 on admit). Transferred to [facility] for furthe management on 9/15.Marland Kitchen TEE showed normal LV (EF 60-65%) and normal RV function with ruptured posterior papillary muscle with severe posterior MR.  Underwent placement of Swan and IABP. S/P MVR 08/23/16.   Treated for group B Strep PNA.   Echo 9/23  RV and LV are normal EF 60%  Extubated 9/29.  However, picture consistent with septic shock on 9/30 so pressors started and she was re-intubated. S/P Trach 10/5.  Developed AFL S/P TEE DC-CV 09/04/16   Sitting up in chair.  Alert. Follows commands. Off milrinone and norepi.  Respiratory aspirate- enterobacter on imipenem.   CVVH keeping even. Now on trach collar.  Co-ox 63% -> 40% (repeated and confirmed). Lactate 0.9. SBP 90-110   Scheduled Meds: . atorvastatin  20 mg Oral q1800  . chlorhexidine gluconate (MEDLINE KIT)  15 mL Mouth Rinse BID  . darbepoetin (ARANESP) injection - NON-DIALYSIS  100 mcg Subcutaneous Q Mon-1800  . famotidine  20 mg Oral Daily  . feeding supplement (NEPRO CARB STEADY)  1,000 mL Per Tube Q24H  . feeding supplement (PRO-STAT SUGAR FREE 64)  30 mL Per Tube 5 X Daily  . Gerhardt's butt cream   Topical BID  . imipenem-cilastatin  500 mg Intravenous Q8H  . insulin aspart  0-24 Units Subcutaneous Q4H  . insulin detemir  10 Units Subcutaneous BID  . mouth rinse  15 mL Mouth Rinse 10 times per day  . midodrine  10 mg Oral TID WC  . propofol  5-80  mcg/kg/min Intravenous Once  . sodium chloride flush  10-40 mL Intracatheter Q12H  . sodium chloride flush  3 mL Intravenous Q12H   Continuous Infusions: . sodium chloride    . sodium chloride 10 mL (09/08/16 2036)  . amiodarone 30 mg/hr (09/10/16 0900)  . norepinephrine (LEVOPHED) Adult infusion Stopped (09/09/16 1700)  . dialysis replacement fluid (prismasate) 600 mL/hr at 09/10/16 0623  . dialysis replacement fluid (prismasate) 200 mL/hr at 09/09/16 2226  . dialysate (PRISMASATE) 1,000 mL/hr at 09/10/16 0912   PRN Meds:.Place/Maintain arterial line **AND** sodium chloride, fentaNYL (SUBLIMAZE) injection, HYDROcodone-acetaminophen, midazolam, ondansetron (ZOFRAN) IV, sodium chloride, sodium chloride flush, sodium chloride flush    Vitals:   09/10/16 0800 09/10/16 0810 09/10/16 0840 09/10/16 0900  BP:   (!) 103/47   Pulse: 68  65 66  Resp: (!) 22  (!) 23 (!) 24  Temp:  97.5 F (36.4 C)    TempSrc:  Axillary    SpO2: 100%  100% 100%  Weight:      Height:        Intake/Output Summary (Last 24 hours) at 09/10/16 1050 Last data filed at 09/10/16 0900  Gross per 24 hour  Intake             1967 ml  Output             2600 ml  Net             -  633 ml    LABS: Basic Metabolic Panel:  Recent Labs  09/09/16 0355 09/09/16 1640 09/10/16 0457  NA 135 136 136  K 3.6 3.7 3.8  CL 100* 103 101  CO2 23 24 24   GLUCOSE 185* 102* 78  BUN 25* 26* 29*  CREATININE 1.28* 1.31* 1.23*  CALCIUM 8.7* 8.8* 8.6*  MG 2.6*  --  2.5*  PHOS 3.0 2.2* 2.4*   Liver Function Tests:  Recent Labs  09/09/16 0355 09/09/16 1640 09/10/16 0457  AST 98*  --  59*  ALT 129*  --  101*  ALKPHOS 155*  --  149*  BILITOT 1.2  --  1.0  PROT 6.5  --  7.0  ALBUMIN 2.9* 2.8* 2.8*   No results for input(s): LIPASE, AMYLASE in the last 72 hours. CBC:  Recent Labs  09/09/16 0355 09/10/16 0457  WBC 16.5* 19.3*  HGB 8.7* 8.8*  HCT 28.4* 28.4*  MCV 93.4 95.6  PLT 288 236   Cardiac Enzymes: No  results for input(s): CKTOTAL, CKMB, CKMBINDEX, TROPONINI in the last 72 hours. BNP: Invalid input(s): POCBNP D-Dimer: No results for input(s): DDIMER in the last 72 hours. Hemoglobin A1C: No results for input(s): HGBA1C in the last 72 hours. Fasting Lipid Panel: No results for input(s): CHOL, HDL, LDLCALC, TRIG, CHOLHDL, LDLDIRECT in the last 72 hours. Thyroid Function Tests: No results for input(s): TSH, T4TOTAL, T3FREE, THYROIDAB in the last 72 hours.  Invalid input(s): FREET3 Anemia Panel: No results for input(s): VITAMINB12, FOLATE, FERRITIN, TIBC, IRON, RETICCTPCT in the last 72 hours.  PHYSICAL EXAM General: Sitting in chair Neck: RIJ TLC LIJ trialysis Trach Lungs: Clear. Decreased at bases CV: Nondisplaced PMI.  Heart tachy, regular S1/S2, no S3/S4, 1/6 SEM USB.   Abdomen: Soft, ND, no HSM. No bruits or masses. +BS  Neurologic: Awake.   Extremities: No clubbing or cyanosis. R and LLE SCDs.   TELEMETRY: A flutter 60-70s  Assessment:   1. Cardiogenic shock: Due to acute severe MR. EF 70% on 9/25 echo.  2. CAD: Inferior posterior STEMI on 9/13 with DES x 2 to OM-1      --Plavix restarted on 9/22 3. 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     --s/p MVR 08/23/2016  4. AKI on CKD stage III - due to ATN likely from cardiogenic shock + contrast with cath. Has required CVVH.  5. Acute respiratory failure: Pulmonary edema.  6. DM2 7. Suspect component of septic shock: Possible PNA, group B Strep in sputum culture.  8. Anemia 9. PVCs   10. Shock liver 11. MRSA bacteremia 12. Atrial fibrillation: Paroxysmal.  On amiodarone.    Plan/Discussion:    Continues to improve. S/p trach now on trach collar.  Off milrinone and norepi as of yesterday but Co-ox very low. Does not appear shocky on exam and lactate is fine. Will recheck co-ox at noon. If not improving will restart milrinone. Continue midodrine  to support BP.   Remains in AFL. Switch amio to po. Off heparin due to trach bleeding. Will start DVT dose heparin to see how she tolerates Cannot repeat DC-CV until anticoagulated and will need repeat TEE as it has been several days of AC.   Continue PT and broad spectrum abx. Back on Plavix with recent stent.   Dhruva Orndoff,MD 09/10/2016  Advanced Heart Failure Team Pager [phone] (M-F; 7a - 4p)  Please contact [facility] for night-coverage after hours (4p -7a )  and weekends on [email or web address]

## 2016-09-11 ENCOUNTER — Inpatient Hospital Stay (HOSPITAL_COMMUNITY): Payer: Medicaid Other

## 2016-09-11 ENCOUNTER — Encounter (HOSPITAL_COMMUNITY): Payer: Self-pay | Admitting: Vascular Surgery

## 2016-09-11 LAB — CBC
HCT: 28.4 % — ABNORMAL LOW (ref 36.0–46.0)
Hemoglobin: 8.6 g/dL — ABNORMAL LOW (ref 12.0–15.0)
MCH: 29.4 pg (ref 26.0–34.0)
MCHC: 30.3 g/dL (ref 30.0–36.0)
MCV: 96.9 fL (ref 78.0–100.0)
Platelets: 226 10*3/uL (ref 150–400)
RBC: 2.93 MIL/uL — ABNORMAL LOW (ref 3.87–5.11)
RDW: 19.6 % — ABNORMAL HIGH (ref 11.5–15.5)
WBC: 18.3 10*3/uL — ABNORMAL HIGH (ref 4.0–10.5)

## 2016-09-11 LAB — COMPREHENSIVE METABOLIC PANEL
ALT: 68 U/L — ABNORMAL HIGH (ref 14–54)
AST: 33 U/L (ref 15–41)
Albumin: 2.7 g/dL — ABNORMAL LOW (ref 3.5–5.0)
Alkaline Phosphatase: 137 U/L — ABNORMAL HIGH (ref 38–126)
Anion gap: 8 (ref 5–15)
BUN: 31 mg/dL — ABNORMAL HIGH (ref 6–20)
CO2: 28 mmol/L (ref 22–32)
Calcium: 8.6 mg/dL — ABNORMAL LOW (ref 8.9–10.3)
Chloride: 102 mmol/L (ref 101–111)
Creatinine, Ser: 1.19 mg/dL — ABNORMAL HIGH (ref 0.44–1.00)
GFR calc Af Amer: 57 mL/min — ABNORMAL LOW (ref >60)
GFR calc non Af Amer: 49 mL/min — ABNORMAL LOW (ref >60)
Glucose, Bld: 103 mg/dL — ABNORMAL HIGH (ref 65–99)
Potassium: 3.5 mmol/L (ref 3.5–5.1)
Sodium: 138 mmol/L (ref 135–145)
Total Bilirubin: 0.8 mg/dL (ref 0.3–1.2)
Total Protein: 6.4 g/dL — ABNORMAL LOW (ref 6.5–8.1)

## 2016-09-11 LAB — PHOSPHORUS: Phosphorus: 1.7 mg/dL — ABNORMAL LOW (ref 2.5–4.6)

## 2016-09-11 LAB — GLUCOSE, CAPILLARY
GLUCOSE-CAPILLARY: 200 mg/dL — AB (ref 65–99)
Glucose-Capillary: 113 mg/dL — ABNORMAL HIGH (ref 65–99)
Glucose-Capillary: 124 mg/dL — ABNORMAL HIGH (ref 65–99)
Glucose-Capillary: 155 mg/dL — ABNORMAL HIGH (ref 65–99)
Glucose-Capillary: 197 mg/dL — ABNORMAL HIGH (ref 65–99)
Glucose-Capillary: 201 mg/dL — ABNORMAL HIGH (ref 65–99)
Glucose-Capillary: 99 mg/dL (ref 65–99)

## 2016-09-11 LAB — MAGNESIUM: MAGNESIUM: 2.5 mg/dL — AB (ref 1.7–2.4)

## 2016-09-11 LAB — PROTIME-INR
INR: 1.4
Prothrombin Time: 17.3 seconds — ABNORMAL HIGH (ref 11.4–15.2)

## 2016-09-11 LAB — RENAL FUNCTION PANEL
ALBUMIN: 2.6 g/dL — AB (ref 3.5–5.0)
ANION GAP: 11 (ref 5–15)
BUN: 37 mg/dL — ABNORMAL HIGH (ref 6–20)
CALCIUM: 8.7 mg/dL — AB (ref 8.9–10.3)
CO2: 22 mmol/L (ref 22–32)
CREATININE: 1.98 mg/dL — AB (ref 0.44–1.00)
Chloride: 101 mmol/L (ref 101–111)
GFR, EST AFRICAN AMERICAN: 31 mL/min — AB (ref >60)
GFR, EST NON AFRICAN AMERICAN: 26 mL/min — AB (ref >60)
Glucose, Bld: 155 mg/dL — ABNORMAL HIGH (ref 65–99)
PHOSPHORUS: 3.4 mg/dL (ref 2.5–4.6)
Potassium: 3.6 mmol/L (ref 3.5–5.1)
SODIUM: 134 mmol/L — AB (ref 135–145)

## 2016-09-11 LAB — TYPE AND SCREEN
ABO/RH(D): AB POS
Antibody Screen: NEGATIVE
Unit division: 0

## 2016-09-11 LAB — CARBOXYHEMOGLOBIN
CARBOXYHEMOGLOBIN: 1.1 % (ref 0.5–1.5)
METHEMOGLOBIN: 1.2 % (ref 0.0–1.5)
O2 Saturation: 60.1 %
TOTAL HEMOGLOBIN: 8.9 g/dL — AB (ref 12.0–16.0)

## 2016-09-11 LAB — HEPARIN LEVEL (UNFRACTIONATED): Heparin Unfractionated: 0.12 IU/mL — ABNORMAL LOW (ref 0.30–0.70)

## 2016-09-11 MED ORDER — PRO-STAT SUGAR FREE PO LIQD
35.0000 mL | Freq: Every day | ORAL | Status: DC
  Administered 2016-09-11: 35 mL
  Filled 2016-09-11: qty 60

## 2016-09-11 MED ORDER — FENTANYL CITRATE (PF) 100 MCG/2ML IJ SOLN
50.0000 ug | INTRAMUSCULAR | Status: DC | PRN
  Administered 2016-09-14 – 2016-09-17 (×8): 50 ug via INTRAVENOUS
  Filled 2016-09-11 (×9): qty 2

## 2016-09-11 MED ORDER — HEPARIN SODIUM (PORCINE) 1000 UNIT/ML IJ SOLN
1000.0000 [IU] | INTRAMUSCULAR | Status: DC | PRN
  Administered 2016-09-11: 1000 [IU] via INTRAVENOUS
  Filled 2016-09-11: qty 1

## 2016-09-11 MED ORDER — CLOPIDOGREL BISULFATE 75 MG PO TABS
75.0000 mg | ORAL_TABLET | Freq: Every day | ORAL | Status: DC
  Administered 2016-09-11 – 2016-09-28 (×16): 75 mg via ORAL
  Filled 2016-09-11 (×18): qty 1

## 2016-09-11 MED ORDER — SODIUM CHLORIDE 0.9 % IV SOLN
250.0000 mg | Freq: Two times a day (BID) | INTRAVENOUS | Status: AC
  Administered 2016-09-11 – 2016-09-16 (×11): 250 mg via INTRAVENOUS
  Filled 2016-09-11 (×11): qty 250

## 2016-09-11 MED ORDER — HEPARIN SODIUM (PORCINE) 1000 UNIT/ML IJ SOLN: 1000.0000 [IU] | INTRAMUSCULAR | Status: DC | PRN

## 2016-09-11 MED ORDER — POTASSIUM PHOSPHATES 15 MMOLE/5ML IV SOLN
10.0000 mmol | Freq: Once | INTRAVENOUS | Status: AC
  Administered 2016-09-11: 10 mmol via INTRAVENOUS
  Filled 2016-09-11: qty 3.33

## 2016-09-11 MED ORDER — HEPARIN (PORCINE) IN NACL 100-0.45 UNIT/ML-% IJ SOLN
1000.0000 [IU]/h | INTRAMUSCULAR | Status: DC
  Administered 2016-09-11: 600 [IU]/h via INTRAVENOUS
  Filled 2016-09-11: qty 250

## 2016-09-11 MED ORDER — PRO-STAT SUGAR FREE PO LIQD
60.0000 mL | Freq: Three times a day (TID) | ORAL | Status: DC
  Administered 2016-09-11 – 2016-09-18 (×20): 60 mL
  Filled 2016-09-11 (×20): qty 60

## 2016-09-11 NOTE — Progress Notes (Signed)
Pharmacy Antibiotic Note  Deborah [name] is a 60 y.o. female who continues on antibiotics for enterobacter PNA.  Today is day # 22  of total abx coverage.  Currently only on Imipenem.  Afebrile, WBC elevated at 18 but trending down.  CRRT stopped today with plans to transition to HD. Will adjust imipenem dosing  Plan: Imipenem 250mg  q12h start tonight as am dose was given while CRRT still running  Follow-up stop date for antibiotics    Height: 5\' 1"  (154.9 cm) Weight: 136 lb 14.5 oz (62.1 kg) IBW/kg (Calculated) : 47.8  Temp (24hrs), Avg:97.3 F (36.3 C), Min:96.1 F (35.6 C), Max:98 F (36.7 C)   Recent Labs Lab 09/08/16 0500  09/08/16 1635 09/09/16 0355 09/09/16 1640 09/10/16 0457 09/10/16 0736 09/10/16 1617 09/11/16 0416  WBC 16.4*  --  17.9* 16.5*  --  19.3*  --   --  18.3*  CREATININE 1.20*  < >  --  1.28* 1.31* 1.23*  --  1.20* 1.19*  LATICACIDVEN  --   --   --   --   --   --  0.89  --   --   < > = values in this interval not displayed.  Estimated Creatinine Clearance: 43 mL/min (by C-G formula based on SCr of 1.19 mg/dL (H)).    No Known Allergies  Antimicrobials this admission: 9/16 Vanc >> 9/18, 9/20>>9/22, resume 9/25>>9/29  9/30>>10/7 9/16 Zosyn >> 9/18, resume 9/25>>9/29  9/30>>10/4 10/4 Imipenem >> 9/25 Fluconazole 9/27 restart 10/2>10/5 CTX 9/18>> 9/24  Dose adjustments this admission: 9/17 Vanc random >>16 (cont 1 gm q48h) 9/28 VT (on CVVHD) = 21, cont 1g q24 10/2 VT 18 on CVVHD on 750 q24  Microbiology results: 9/15 BCx: neg F 9/15 Sputum: mod group b strep/MSSA 9/15 MRSA PCR: negative 9/24 BCx2: 1/2 BCID CoNS likely contaminant 9/29 cdiff neg 9/30 BC x 2: neg F 10/1 TA -enterobacter S imip, cipro, R zosyn   Bonnita Nasuti Pharm.D. CPP, BCPS Clinical Pharmacist [phone] 09/11/2016 11:15 AM

## 2016-09-11 NOTE — Progress Notes (Addendum)
11 Days Post-Op Procedure(s) (LRB): INSERTION OF DIALYSIS CATHETER LEFT INTERNAL JUGULAR VEIN & INSERTION OF TRIPLE LUMEN RIGHT INTERNAL JUGULAR VEIN (Bilateral) Subjective: Back in nsr with improved co-ox Enterobacter VAP on imipenem with improving WBC OOB to chair  Bleeding from terach better- on low dose lovenox Objective: Vital signs in last 24 hours: Temp:  [96.1 F (35.6 C)-98 F (36.7 C)] 97.4 F (36.3 C) (10/09 0416) Pulse Rate:  [37-123] 80 (10/09 0700) Cardiac Rhythm: Normal sinus rhythm (10/09 0500) Resp:  [19-28] 25 (10/09 0700) BP: (94-185)/(47-174) 114/64 (10/09 0700) SpO2:  [100 %] 100 % (10/09 0700) Arterial Line BP: (79-151)/(49-107) 103/99 (10/09 0300) FiO2 (%):  [35 %-40 %] 40 % (10/09 0700) Weight:  [136 lb 14.5 oz (62.1 kg)] 136 lb 14.5 oz (62.1 kg) (10/09 0430)  Hemodynamic parameters for last 24 hours: CVP:  [7 mmHg-20 mmHg] 19 mmHg  Intake/Output from previous day: 10/08 0701 - 10/09 0700 In: 1774.1 [I.V.:424.1; NG/GT:1050; IV Piggyback:300] Out: 2248 [Stool:150] Intake/Output this shift: Total I/O In: 14.8 [I.V.:14.8] Out: 50 [Other:50]       Exam    General- alert and comfortable   Lungs- clear without rales, wheezes   Cor- regular rate and rhythm, no murmur , gallop   Abdomen- soft, non-tender   Extremities - warm, non-tender, minimal edema   Neuro- oriented, appropriate, no focal weakness   Lab Results:  Recent Labs  09/10/16 0457 09/11/16 0416  WBC 19.3* 18.3*  HGB 8.8* 8.6*  HCT 28.4* 28.4*  PLT 236 226   BMET:  Recent Labs  09/10/16 1617 09/11/16 0416  NA 135 138  K 3.8 3.5  CL 104 102  CO2 23 28  GLUCOSE 143* 103*  BUN 32* 31*  CREATININE 1.20* 1.19*  CALCIUM 8.6* 8.6*    PT/INR:  Recent Labs  09/11/16 0416  LABPROT 17.3*  INR 1.40   ABG    Component Value Date/Time   PHART 7.541 (H) 09/08/2016 0412   HCO3 25.2 09/08/2016 0412   TCO2 26 09/08/2016 0412   ACIDBASEDEF 4.1 (H) 09/02/2016 1850   O2SAT 60.1  09/11/2016 0400   CBG (last 3)   Recent Labs  09/10/16 2007 09/11/16 0017 09/11/16 0415  GLUCAP 135* 200* 99    Assessment/Plan: S/P Procedure(s) (LRB): INSERTION OF DIALYSIS CATHETER LEFT INTERNAL JUGULAR VEIN & INSERTION OF TRIPLE LUMEN RIGHT INTERNAL JUGULAR VEIN (Bilateral) Cont trach - vent wean PM valve when off vent Cont mobilization 14 days imipenum Possible conversion to HD  LOS: 24 days    Deborah [name] 09/11/2016

## 2016-09-11 NOTE — Progress Notes (Signed)
PULMONARY / CRITICAL CARE MEDICINE   Name: Deborah [name] MRN: [id number] DOB: [birth year]    ADMISSION DATE:  08/18/2016 CONSULTATION DATE:  08/18/2016  REFERRING MD:  Bensimhon   CHIEF COMPLAINT:  Ventilator management and critical care services   HISTORY OF PRESENT ILLNESS:   60 yo female admitted to [facility] 08/15/16 with STEMI s/p cath to [place].  Developed cardiogenic shock, VDRF (ETT 9/14), AKI with metabolic acidosis and started on CRRT 08/17/16.  Transferred to [facility] to tx shock and evaluated new severe MR from papillary muscle rupture.  She also had fever and found to have Group B Strep in sputum culture. Mitral valve replacement 9/20.  SUBJECTIVE:  Tolerating ATC. Off CVVH today AM.  VITAL SIGNS: BP (!) 141/101   Pulse 82   Temp 97.7 F (36.5 C)   Resp 17   Ht 5\' 1"  (1.549 m)   Wt 136 lb 14.5 oz (62.1 kg)   SpO2 100%   BMI 25.87 kg/m   HEMODYNAMICS: CVP:  [7 mmHg-20 mmHg] 19 mmHg  VENTILATOR SETTINGS: Vent Mode: PRVC FiO2 (%):  [35 %-40 %] 35 % Set Rate:  [24 bmp] 24 bmp Vt Set:  [420 mL-520 mL] 420 mL PEEP:  [5 cmH20] 5 cmH20 Plateau Pressure:  [17 cmH20-21 cmH20] 17 cmH20  INTAKE / OUTPUT: I/O last 3 completed shifts: In: 2794.5 [I.V.:764.5; NG/GT:1530; IV [id number]:387] Out: 3333 [Other:3083; Stool:250]  PHYSICAL EXAMINATION: General: No distress HENT: Trach in place, [place]/AT, PERRL, EOM-I and MMM Neuro: awake, alert, follows commands, nods appropriately, MAE, gen weakness  PULM: Clear, Non labored breathing, CV: RRR, systolic murmur noted GI: BS+, soft, nontender MSK: normal bulk and tone.    LABS:  BMET  Recent Labs Lab 09/10/16 0457 09/10/16 1617 09/11/16 0416  NA 136 135 138  K 3.8 3.8 3.5  CL 101 104 102  CO2 24 23 28   BUN 29* 32* 31*  CREATININE 1.23* 1.20* 1.19*  GLUCOSE 78 143* 103*    Electrolytes  Recent Labs Lab 09/09/16 0355  09/10/16 0457 09/10/16 1617 09/11/16 0416  CALCIUM 8.7*  < > 8.6* 8.6* 8.6*  MG 2.6*  --  2.5*  --   2.5*  PHOS 3.0  < > 2.4* 2.6 1.7*  < > = values in this interval not displayed.  CBC  Recent Labs Lab 09/09/16 0355 09/10/16 0457 09/11/16 0416  WBC 16.5* 19.3* 18.3*  HGB 8.7* 8.8* 8.6*  HCT 28.4* 28.4* 28.4*  PLT 288 236 226   Coag's  Recent Labs Lab 09/06/16 1605 09/10/16 0457 09/11/16 0416  INR 1.84 1.40 1.40   Sepsis Markers  Recent Labs Lab 09/10/16 0736  LATICACIDVEN 0.89   ABG  Recent Labs Lab 09/06/16 0425 09/07/16 0420 09/08/16 0412  PHART 7.446 7.460* 7.541*  PCO2ART 37.4 35.2 29.4*  PO2ART 105.0 118* 120.0*   Liver Enzymes  Recent Labs Lab 09/09/16 0355  09/10/16 0457 09/10/16 1617 09/11/16 0416  AST 98*  --  59*  --  33  ALT 129*  --  101*  --  68*  ALKPHOS 155*  --  149*  --  137*  BILITOT 1.2  --  1.0  --  0.8  ALBUMIN 2.9*  < > 2.8* 2.8* 2.7*  < > = values in this interval not displayed.  Cardiac Enzymes No results for input(s): TROPONINI, PROBNP in the last 168 hours.  Glucose  Recent Labs Lab 09/10/16 1145 09/10/16 1551 09/10/16 2007 09/11/16 0017 09/11/16 0415 09/11/16 0817  GLUCAP 153*  126* 135* 200* 99 113*   Imaging Dg Abd Portable 1v  Result Date: 09/11/2016 CLINICAL DATA:  NG tube placement. EXAM: PORTABLE ABDOMEN - 1 VIEW COMPARISON:  09/08/2016 FINDINGS: Nasogastric tube coiled in the stomach with the tip projecting over the proximal body of the stomach. There is no bowel dilatation to suggest obstruction. There is no evidence of pneumoperitoneum, portal venous gas or pneumatosis. There are no pathologic calcifications along the expected course of the ureters. There is prior median sternotomy with mitral valve replacement.The osseous structures are unremarkable. IMPRESSION: Nasogastric tube coiled in the stomach with the tip projecting over the proximal body of the stomach. Electronically Signed   By: Kathreen Devoid   On: 09/11/2016 09:14   STUDIES:  LHC 9/12: PCI to OM1, severe MR, EF 55 to 60% RHC 9/15: RA 5, RV  37/3/6, PA 38/18/26, PCWP 15, CI 1.9, PVR 3.3 WU TEE 9/15: EF 60 to 65%, flail motion MV with severe MR RUQ ultrasound 9/23 > Gallbladder wall thickening noted, suggestion of sludge, small R effusion, liver unremarkable but left lobe not well visulalized  MICROBIOLOGY: MRSA PCR 9/12:  Negative MRSA PCR 9/15:  Negative Tracheal Asp Ctx 9/15:  Strep agalactiae and enterobacter. Blood Ctx x2 9/15:  Negative  MRSA PCR 9/19:  Negative  Blood culture 9/24: coag neg staph 2/4  ANTIBIOTICS: Zosyn 9/16 - 9/17 Vancomycin 9/16 - 9/17 Rocephin 9/17 >> 9/24 Vancomycin 9/20 (surgical prophylaxis) Vanc 9/25 >off Zosyn 9/25 > off  Fluconazole 9/25 > 10/5 Imipenem 10/4>>>  SIGNIFICANT EVENTS: 9/12 Admit to [facility], PCI to [place] 9/14 ETT, CRRT 9/15 to [facility] 9/16 off insulin gtt 9/20 To OR for Mitral Valve Repair w/ bioprosthetic valve 9/28 To OR for permcath 9/29 Extubated. 9/30 Reintubated 10/5 trach   LINES/TUBES: Right IJ PICC 9/12 - 9/20 Rt femoral introducer 9/15 - 9/20 OETT 8.0 9/14 >> 9/29, 9/30 >>10/5 HD cath left IJ 9/14 >> R IJ Introducer/CVL/Swan 9/20 >>  9/25 IABP 9/15 >>9/22 Lt femoral a line 9/15 >> 9/25 Y Chest Tube/Mediastinal Chest Tube 9/20 >>out L brachial art line 9/25 > 9/28 R IJ CVL > 9/28 L IJ Permcath >  Perc trach (JY) 10/5>>>  ASSESSMENT / PLAN:  PULMONARY A: Acute Hypoxic Respiratory Failure - Secondary to acute pulmonary edema, reintubated 9/30 Aspiration risk Trach site bleeding - resolved.  P:   Cont ATC daytime. OK to deflate cuff while on ATC Will increase the time she is off the vent gradually.  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 Shock - Combination cardiogenic & septic Afib, S/P DC cardioversion on 10/2, currently in sinus  P:  Post-op Mgt per CVTS/Cardiology. Levophed off. Still on milrinone ASA, per cardiology/CVTS  Amio PO per cardiology/CVTS.  RENAL A:   Acute Renal Failure -  Baseline creatinine 1.6-1.7 P:   Nephrology Following Off CVVH today. May transition to HD. Monitor BMET and UOP Replace electrolytes as needed  GASTROINTESTINAL A:   Transaminitis - peaked 9/23, near complete resolution, unclear etiology,  Mild Malnutrition - Related to critical illness  P:   Pepcid IV q12hr Continue TF Intermittent f/u LFT's   HEMATOLOGIC A:   Anemia - Last transfusion 9/20 Leukocytosis - Multifactorial  P:  Trending cell counts w/ CBC SCDs  INFECTIOUS A:   Group B Strep Pneumonia Coag negative staph bacteremia (likely contaminant) Line changes on 9/28 Leucocytosis Enterobacter species - sputum  P:   imipenem as above  Re-culture for fever. Diflucan  off, cultures negative and LFTs elevated  ENDOCRINE A:   DM - Glucose controlled  P:   SSI  Levemir BID  NEUROLOGIC A:   New onset R sided hemiplegia and Preferental gaze to Right (9/25). Cranial CT scan was (-) for acute changes >> resolved without evidence of weakness on 9/29 Acute Encephalopathy - Multifactorial and likely combination toxic metabolic & hypoxia   P:   Follow neuro exam PRN fentanyl    Marshell Garfinkel MD  Pulmonary and Critical Care Pager [phone] If no answer or after 3pm call: [phone] 09/11/2016, 10:10 AM,

## 2016-09-11 NOTE — Progress Notes (Signed)
ANTICOAGULATION CONSULT NOTE   Pharmacy Consult for Heparin  Indication: atrial fibrillation  No Known Allergies  Patient Measurements: Height: 5\' 1"  (154.9 cm) Weight: 136 lb 14.5 oz (62.1 kg) IBW/kg (Calculated) : 47.8   Vital Signs: Temp: 98.6 F (37 C) (10/09 1944) Temp Source: Oral (10/09 1944) BP: 120/66 (10/09 2015) Pulse Rate: 89 (10/09 2015)  Labs:  Recent Labs  09/09/16 0355  09/10/16 0457 09/10/16 1617 09/11/16 0416 09/11/16 1953  HGB 8.7*  --  8.8*  --  8.6*  --   HCT 28.4*  --  28.4*  --  28.4*  --   PLT 288  --  236  --  226  --   LABPROT  --   --  17.3*  --  17.3*  --   INR  --   --  1.40  --  1.40  --   HEPARINUNFRC  --   --   --   --   --  0.12*  CREATININE 1.28*  < > 1.23* 1.20* 1.19* 1.98*  < > = values in this interval not displayed.  Estimated Creatinine Clearance: 25.8 mL/min (by C-G formula based on SCr of 1.98 mg/dL (H)).   Medical History: Past Medical History:  Diagnosis Date  . Diabetes mellitus   . Hyperlipidemia   . Hypertension       Assessment: 60yof admitted for MVR - bioprosthetic, after AMI with stents 7/17.  She has been in Afib post op s/p DCCV last week. She was in Afib over weekend but currently in [place]. Heparin with CVVHD has maintained systemic anticoagulation.  CVVHD stopped today and heparin has been restarted at a low rate (given recent surgery and recent oozing from trach site last week)  Goal of Therapy:  Heparin level 0.3-0.5 units/ml Monitor platelets by anticoagulation protocol: Yes   Plan:   -Increase heparin to 200 units/hr -heparin level in 8 hrs -daily heparin level and CBC  Hildred Laser, Pharm D 09/11/2016 8:37 PM

## 2016-09-11 NOTE — Progress Notes (Signed)
Rehab Admissions Coordinator Note:  Patient was screened by Retta Diones for appropriateness for an Inpatient Acute Rehab Consult.  Noted PT recommending CIR.  At this time, we are recommending Inpatient Rehab consult.  Retta Diones 09/11/2016, 3:14 PM  I can be reached at [phone].

## 2016-09-11 NOTE — Progress Notes (Addendum)
S: Nods that she is OK O:BP 114/64   Pulse 80   Temp 97.7 F (36.5 C)   Resp (!) 25   Ht 5' 1" (1.549 m)   Wt 62.1 kg (136 lb 14.5 oz)   SpO2 100%   BMI 25.87 kg/m   Intake/Output Summary (Last 24 hours) at 09/11/16 0852 Last data filed at 09/11/16 0800  Gross per 24 hour  Intake           1732.2 ml  Output             2066 ml  Net           -333.8 ml   Weight change: -1.4 kg (-3 lb 1.4 oz) KDX:IPJAS and alert.  Trached CVS:RRR 2/6 systolic M Resp: Clear ant Abd: +BS NTND Ext: no edema NEURO: Follows commands Rt IJ temp cath Lt IJ HD cath   . amiodarone  200 mg Oral BID  . atorvastatin  20 mg Oral q1800  . chlorhexidine gluconate (MEDLINE KIT)  15 mL Mouth Rinse BID  . darbepoetin (ARANESP) injection - NON-DIALYSIS  100 mcg Subcutaneous Q Mon-1800  . enoxaparin (LOVENOX) injection  30 mg Subcutaneous Q24H  . famotidine  20 mg Oral Daily  . feeding supplement (NEPRO CARB STEADY)  1,000 mL Per Tube Q24H  . feeding supplement (PRO-STAT SUGAR FREE 64)  35 mL Per Tube 5 X Daily  . Gerhardt's butt cream   Topical BID  . imipenem-cilastatin  500 mg Intravenous Q8H  . insulin aspart  0-24 Units Subcutaneous Q4H  . insulin detemir  10 Units Subcutaneous BID  . mouth rinse  15 mL Mouth Rinse 10 times per day  . midodrine  10 mg Oral TID WC   Dg Chest Port 1 View  Result Date: 09/10/2016 CLINICAL DATA:  Hypoxia EXAM: PORTABLE CHEST 1 VIEW COMPARISON:  September 09, 2016 FINDINGS: Tracheostomy catheter tip is 6.0 cm above the carina. Central catheters have tips in the superior vena cava with the dual-lumen catheter tip at the cavoatrial junction. Feeding tube tip is below the diaphragm. No pneumothorax. There is atelectatic change in the left base. There is a probable small pleural effusion. Heart is mildly enlarged with pulmonary vascular within normal limits. There is a mitral valve replacement present. No adenopathy evident. IMPRESSION: Tube and catheter positions as described  without pneumothorax. Stable left base atelectasis. Suspect small right pleural effusion. Stable cardiomegaly. Electronically Signed   By: Lowella Grip III M.D.   On: 09/10/2016 08:45   BMET    Component Value Date/Time   NA 138 09/11/2016 0416   K 3.5 09/11/2016 0416   CL 102 09/11/2016 0416   CO2 28 09/11/2016 0416   GLUCOSE 103 (H) 09/11/2016 0416   BUN 31 (H) 09/11/2016 0416   CREATININE 1.19 (H) 09/11/2016 0416   CREATININE 1.41 (H) 10/04/2012 1631   CALCIUM 8.6 (L) 09/11/2016 0416   GFRNONAA 49 (L) 09/11/2016 0416   GFRAA 57 (L) 09/11/2016 0416   CBC    Component Value Date/Time   WBC 18.3 (H) 09/11/2016 0416   RBC 2.93 (L) 09/11/2016 0416   HGB 8.6 (L) 09/11/2016 0416   HCT 28.4 (L) 09/11/2016 0416   PLT 226 09/11/2016 0416   MCV 96.9 09/11/2016 0416   MCH 29.4 09/11/2016 0416   MCHC 30.3 09/11/2016 0416   RDW 19.6 (H) 09/11/2016 0416   LYMPHSABS 2.3 08/23/2016 0328   MONOABS 4.1 (H) 08/23/2016 0328   EOSABS 0.5 08/23/2016  0328   BASOSABS 0.0 08/23/2016 0328     Assessment:  1. Acute on CKD 3 in setting of cath/MVR, CABG X 1, On CVVHD  Metabolically stable except sl low PO4 2. SP MVR 3. MRSA bacteremia 4. DM 5. SP STEMI   Plan: 1. System ready to clot so will DC CVVHD and attempt HD tomorrow or Wed 2. Replace Po4 3. Daily labs   Deborah [name]

## 2016-09-11 NOTE — Progress Notes (Signed)
Patient ID: Deborah [name], female   DOB: 1956/08/30, 60 y.o.   MRN: [id number]   ADVANCED HF TEAM  60 y/o woman with DM2, HTN, HL and noncompliance presented to [facility] ER with infrerior posterior STEMI on 9/12. Taken to cath lab and found to have occluded small to moderate OM-1 branch. Underwent successful PCS with stent x 2 by Dr. Clayborn Bigness. Coronaries otherwise ok. EF 55-60% with severe MR confirmed by echo.   Post-cath developed respiratory failure and required intubation. Developed shock and renal failure with peak creatinine 3.4 (1.6 on admit). Transferred to [facility] for furthe management on 9/15.Marland Kitchen TEE showed normal LV (EF 60-65%) and normal RV function with ruptured posterior papillary muscle with severe posterior MR.  Underwent placement of Swan and IABP. S/P MVR 08/23/16.   Treated for group B Strep PNA.   Echo 9/23  RV and LV are normal EF 60%  Extubated 9/29.  However, picture consistent with septic shock on 9/30 so pressors started and she was re-intubated. S/P Trach 10/5.  Developed AFL S/P TEE DC-CV 09/04/16   Milrinone stopped 09/10/16, Coox fell to 30% -> 47.4% -> 60.1 with resumption.  CVP 14 this am.  Lying in bed. Sleepy. Had been wide aware and alert all morning per Nurse. Respiratory aspirate- enterobacter on imipenem.     Off CVVH this am.  Plan on IHD Wednesday. Now on trach collar.  Lactate 0.89. SBP 110-120s this am.    Scheduled Meds: . amiodarone  200 mg Oral BID  . atorvastatin  20 mg Oral q1800  . chlorhexidine gluconate (MEDLINE KIT)  15 mL Mouth Rinse BID  . darbepoetin (ARANESP) injection - NON-DIALYSIS  100 mcg Subcutaneous Q Mon-1800  . enoxaparin (LOVENOX) injection  30 mg Subcutaneous Q24H  . famotidine  20 mg Oral Daily  . feeding supplement (NEPRO CARB STEADY)  1,000 mL Per Tube Q24H  . feeding supplement (PRO-STAT SUGAR FREE 64)  35 mL Per Tube 5 X Daily  . Gerhardt's butt cream   Topical BID  . imipenem-cilastatin  500 mg Intravenous Q8H  . insulin  aspart  0-24 Units Subcutaneous Q4H  . insulin detemir  10 Units Subcutaneous BID  . mouth rinse  15 mL Mouth Rinse 10 times per day  . midodrine  10 mg Oral TID WC  . potassium phosphate IVPB (mmol)  10 mmol Intravenous Once   Continuous Infusions: . sodium chloride    . sodium chloride 10 mL/hr at 09/11/16 0800  . milrinone 0.25 mcg/kg/min (09/11/16 0800)  . norepinephrine (LEVOPHED) Adult infusion Stopped (09/09/16 1700)  . dialysis replacement fluid (prismasate) 200 mL/hr at 09/11/16 0024  . dialysis replacement fluid (prismasate) 600 mL/hr at 09/11/16 0025  . dialysate (PRISMASATE) 1,000 mL/hr at 09/11/16 0452   PRN Meds:.Place/Maintain arterial line **AND** sodium chloride, fentaNYL (SUBLIMAZE) injection, heparin, HYDROcodone-acetaminophen, midazolam, ondansetron (ZOFRAN) IV, sodium chloride, sodium chloride flush    Vitals:   09/11/16 0600 09/11/16 0630 09/11/16 0700 09/11/16 0839  BP: 121/62 114/63 114/64 (!) 141/101  Pulse: 78 83 80 82  Resp: (!) 22 (!) 25 (!) 25 17  Temp:   97.7 F (36.5 C)   TempSrc:      SpO2: 100% 100% 100% 100%  Weight:      Height:        Intake/Output Summary (Last 24 hours) at 09/11/16 1018 Last data filed at 09/11/16 0800  Gross per 24 hour  Intake           1558.8  ml  Output             1771 ml  Net           -212.2 ml    LABS: Basic Metabolic Panel:  Recent Labs  09/10/16 0457 09/10/16 1617 09/11/16 0416  NA 136 135 138  K 3.8 3.8 3.5  CL 101 104 102  CO2 24 23 28   GLUCOSE 78 143* 103*  BUN 29* 32* 31*  CREATININE 1.23* 1.20* 1.19*  CALCIUM 8.6* 8.6* 8.6*  MG 2.5*  --  2.5*  PHOS 2.4* 2.6 1.7*   Liver Function Tests:  Recent Labs  09/10/16 0457 09/10/16 1617 09/11/16 0416  AST 59*  --  33  ALT 101*  --  68*  ALKPHOS 149*  --  137*  BILITOT 1.0  --  0.8  PROT 7.0  --  6.4*  ALBUMIN 2.8* 2.8* 2.7*   No results for input(s): LIPASE, AMYLASE in the last 72 hours. CBC:  Recent Labs  09/10/16 0457  09/11/16 0416  WBC 19.3* 18.3*  HGB 8.8* 8.6*  HCT 28.4* 28.4*  MCV 95.6 96.9  PLT 236 226   Cardiac Enzymes: No results for input(s): CKTOTAL, CKMB, CKMBINDEX, TROPONINI in the last 72 hours. BNP: Invalid input(s): POCBNP D-Dimer: No results for input(s): DDIMER in the last 72 hours. Hemoglobin A1C: No results for input(s): HGBA1C in the last 72 hours. Fasting Lipid Panel: No results for input(s): CHOL, HDL, LDLCALC, TRIG, CHOLHDL, LDLDIRECT in the last 72 hours. Thyroid Function Tests: No results for input(s): TSH, T4TOTAL, T3FREE, THYROIDAB in the last 72 hours.  Invalid input(s): FREET3 Anemia Panel: No results for input(s): VITAMINB12, FOLATE, FERRITIN, TIBC, IRON, RETICCTPCT in the last 72 hours.  PHYSICAL EXAM General: lying in bed, NAD Neck: RIJ TLC LIJ trialysis trach Lungs: Diminished bases. CV: Nondisplaced PMI.  Heart tachy, regular S1/S2, no S3/S4, 1/6 SEM USB.   Abdomen: Soft, NT, ND, no HSM. No bruits or masses. +BS  Neurologic: Sleepy.   Extremities: No clubbing or cyanosis. R and LLE SCDs.   TELEMETRY: Reviewed, NSR 70-80s   Assessment:   1. Cardiogenic shock: Due to acute severe MR. EF 70% on 9/25 echo.  2. CAD: Inferior posterior STEMI on 9/13 with DES x 2 to OM-1      --Plavix restarted on 9/22 3. 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     --s/p MVR 08/23/2016  4. AKI on CKD stage III - due to ATN likely from cardiogenic shock + contrast with cath. Has required CVVH.  5. Acute respiratory failure: Pulmonary edema.  6. DM2 7. Suspect component of septic shock: Possible PNA, group B Strep in sputum culture.  8. Anemia 9. PVCs   10. Shock liver 11. MRSA bacteremia 12. Atrial fibrillation: Paroxysmal.  On amiodarone.    Plan/Discussion:    Continues to improve. S/p trach now on trach collar.   Coox dropped off milrinone. Now back on 0.25 mcg/kg/min. Continue  midodrine to support BP.   In NSR this am. Continue po amio.     Continue PT and broad spectrum abx. Back on Plavix with recent stent.   Shirley Friar, PA-C 09/11/2016   Advanced Heart Failure Team Pager [phone] (M-F; 7a - 4p)  Please contact [facility] for night-coverage after hours (4p -7a ) and weekends on [email or web address]   Patient seen and examined with Oda Kilts, PA-C. We discussed all aspects of the encounter.  I agree with the assessment and plan as stated above.   Developed recurrent low output over the weekend and milrinone restarted. Co-ox improved now. Unsure why co-ox drops off milrinone with normal EF. ? RV failure.   CVP up. Planning for IHD on Wednesday. Back in NSR on amio. Will restart heparin and coumadin. Continue Plavix for recent stent.  Continue PT.   Tulani Kidney,MD 4:42 PM

## 2016-09-11 NOTE — Progress Notes (Signed)
ANTICOAGULATION CONSULT NOTE - Initial Consult  Pharmacy Consult for Heparin  Indication: atrial fibrillation  No Known Allergies  Patient Measurements: Height: 5\' 1"  (154.9 cm) Weight: 136 lb 14.5 oz (62.1 kg) IBW/kg (Calculated) : 47.8   Vital Signs: Temp: 97.7 F (36.5 C) (10/09 0700) Temp Source: Oral (10/09 0416) BP: 141/101 (10/09 0839) Pulse Rate: 82 (10/09 0839)  Labs:  Recent Labs  09/09/16 0355  09/10/16 0457 09/10/16 1617 09/11/16 0416  HGB 8.7*  --  8.8*  --  8.6*  HCT 28.4*  --  28.4*  --  28.4*  PLT 288  --  236  --  226  LABPROT  --   --  17.3*  --  17.3*  INR  --   --  1.40  --  1.40  CREATININE 1.28*  < > 1.23* 1.20* 1.19*  < > = values in this interval not displayed.  Estimated Creatinine Clearance: 43 mL/min (by C-G formula based on SCr of 1.19 mg/dL (H)).   Medical History: Past Medical History:  Diagnosis Date  . Diabetes mellitus   . Hyperlipidemia   . Hypertension       Assessment: 60yof admitted for MVR - bioprosthetic, after AMI with stents 7/17.  She has been in Afib post op s/p DCCV last week. She was in Afib ove weekend but currently in [place]. Heparin with CVVHD has maintained systemic anticoagulation.  CVVHD stopped today plan to initiate heparin drip.  Will start low 10uts/kg/hr and titrate slow with no bolus  given recent surgery and recent oozing from trach site last week.    Goal of Therapy:  Heparin level 0.3-0.5 units/ml Monitor platelets by anticoagulation protocol: Yes   Plan:  No bolus Heparin drip low rate to start 600 uts/hr Titrate to low goal HL in 6hr after start  HL and CBC daily  Bonnita Nasuti Pharm.D. CPP, BCPS Clinical Pharmacist [phone] 09/11/2016 11:04 AM

## 2016-09-11 NOTE — Progress Notes (Addendum)
Physical Therapy Treatment Patient Details Name: Deborah [name] MRN: [id number] DOB: 1956-08-23 Today's Date: 09/11/2016    History of Present Illness 60 yo admitted to [facility] 9/12 with STEMI develped cardiogenic shock with ETT and cRRT initiated on 9/14 after cath, transfer to [facility] 9/93, metabolic acidosis, 7/16, MVR, extubated 9/29, reintubated 9/30, 9/25-9/29 hemiplegia developed and resolved, trach and bronchoscopy 10/5. PMHx: DM, HTN, HLD    PT Comments    Pt off of vent and CRRT today with continued pleasant demeanor. Pt with increased ability with standing and only able to maintain 15sec max in standing over 4 trials. Unable to advance feet with stepping. Pt educated for all sternal precautions as well as HEP today. Will continue to follow.   HR 93 BP 92/62 sitting sats 100% on 35% trach collar   Follow Up Recommendations  CIR;Supervision/Assistance - 24 hour     Equipment Recommendations  Rolling walker with 5" wheels    Recommendations for Other Services OT consult;Speech consult;Rehab consult     Precautions / Restrictions Precautions Precautions: Sternal;Fall Precaution Comments: trach, flexiseal    Mobility  Bed Mobility Overal bed mobility: Needs Assistance Bed Mobility: Rolling;Sidelying to Sit Rolling: Max assist Sidelying to sit: Max assist;+2 for safety/equipment       General bed mobility comments: cues for sequence and hand placement with assist to rotate pelvis, bring legs off of bed and elevate trunk.   Transfers Overall transfer level: Needs assistance   Transfers: Sit to/from Stand;Stand Pivot Transfers Sit to Stand: Mod assist;+2 safety/equipment Stand pivot transfers: Max assist;+2 physical assistance       General transfer comment: pt stood from bed x 2 and chair x 2 with mod assist with cues and assist for anterior translation, hand placement and rise from surface. max assist to pivot as pt unable to advance feet and bil knees  blockeed.  Ambulation/Gait                 Stairs            Wheelchair Mobility    Modified Rankin (Stroke Patients Only)       Balance                                    Cognition Arousal/Alertness: Awake/alert Behavior During Therapy: Flat affect Overall Cognitive Status: Difficult to assess                      Exercises General Exercises - Lower Extremity Long Arc Quad: AROM;Both;Seated;10 reps Hip ABduction/ADduction: AAROM;Both;10 reps;Seated Hip Flexion/Marching: AAROM;Both;10 reps;Seated    General Comments        Pertinent Vitals/Pain Pain Assessment: No/denies pain    Home Living                      Prior Function            PT Goals (current goals can now be found in the care plan section) Progress towards PT goals: Progressing toward goals    Frequency    Min 3X/week      PT Plan Discharge plan needs to be updated;Frequency needs to be updated    Co-evaluation             End of Session Equipment Utilized During Treatment: Gait belt Activity Tolerance: Patient tolerated treatment well Patient left: in chair;with call bell/phone within reach;with chair  alarm set     Time: [phone] PT Time Calculation (min) (ACUTE ONLY): 25 min  Charges:  $Therapeutic Exercise: 8-22 mins $Therapeutic Activity: 8-22 mins                    G Codes:      [name] September 22, 2016, 12:29 PM Elwyn Reach, [place]

## 2016-09-11 NOTE — Progress Notes (Signed)
Nutrition Follow Up  DOCUMENTATION CODES:   Not applicable  INTERVENTION:    Decrease Nepro formula to goal rate of 20 ml/hr    Increase Prostat liquid protein to 60 ml TID via tube  Total TF regimen to provide 1464 kcals, 129 gm protein, 334 ml of free water  NUTRITION DIAGNOSIS:   Inadequate oral intake related to inability to eat as evidenced by NPO status; ongoing  GOAL:   Patient will meet greater than or equal to 90% of their needs; met  MONITOR:   Vent status, TF tolerance, Labs, Weight trends, Skin, I & O's  ASSESSMENT:   60 y/o woman with DM2, HTN, HL and noncompliance presented to [facility] ER with infrerior posterior STEMI on 9/12. Taken to cath lab and found to have occluded small to moderate OM-1 branch. Underwent successful PCS with stent x 2 by Dr. Clayborn Bigness. Coronaries otherwise ok. EF 55-60% with severe MR confirmed by echo.    Pt s/p procedures 9/20: MITRAL VALVE REPAIR (MVR)  CORONARY ARTERY BYPASS GRAFTING (CABG)    Patient continues on ventilator support (nocturnal) MV: 10.7 L/min Temp (24hrs), Avg:97.4 F (36.3 C), Min:96.1 F (35.6 C), Max:98 F (36.7 C)  S/p trach placement 10/5 >> ATC during daytime. Currently off CVVHD; may transition to HD. Nepro formula currently infusing at 30 ml/hr via CORTRAK small bore feeding tube (tip in stomach). Family asking RD when patient can eat. Labs and medications reviewed.  CBG (last 3)   Recent Labs  09/11/16 0415 09/11/16 0817 09/11/16 1204  GLUCAP 99 113* 124*   Diet Order:  Diet NPO time specified  Skin:  Reviewed, no issues  Last BM:  10/8  Height:   Ht Readings from Last 1 Encounters:  09/02/16 5' 1"  (1.549 m)    Weight:   Wt Readings from Last 1 Encounters:  09/11/16 136 lb 14.5 oz (62.1 kg)  Admit weight (08/18/16) 156 lb (71.2 kg)  Ideal Body Weight:  47.7 kg  BMI:  Body mass index is 25.87 kg/m.  Estimated Nutritional Needs:   Kcal:  1429  Protein:  110-120 gm  Fluid:   per MD  EDUCATION NEEDS:   No education needs identified at this time  Arthur Holms, RD, LDN Pager #: [phone] After-Hours Pager #: [phone]

## 2016-09-11 NOTE — Progress Notes (Signed)
TCTS BRIEF SICU PROGRESS NOTE  11 Days Post-Op  S/P Procedure(s) (LRB): INSERTION OF DIALYSIS CATHETER LEFT INTERNAL JUGULAR VEIN & INSERTION OF TRIPLE LUMEN RIGHT INTERNAL JUGULAR VEIN (Bilateral)   Currently on TC trial Now off CRRT NSR w/ stable BP O2 sats 100% on 35% FiO2  Plan: Continue current plan  Rexene Alberts, MD 09/11/2016 6:55 PM

## 2016-09-11 NOTE — Evaluation (Signed)
Passy-Muir Speaking Valve - Evaluation Patient Details  Name: Deborah [name] MRN: [id number] Date of Birth: 1956-10-24  Today's Date: 09/11/2016 Time: 1511-1530 SLP Time Calculation (min) (ACUTE ONLY): 19 min  Past Medical History:  Past Medical History:  Diagnosis Date  . Diabetes mellitus   . Hyperlipidemia   . Hypertension    Past Surgical History:  Past Surgical History:  Procedure Laterality Date  . CARDIAC CATHETERIZATION N/A 08/15/2016   Procedure: Left Heart Cath and Coronary Angiography;  Surgeon: Deborah Kida, MD;  Location: [place] CV [facility];  Service: Cardiovascular;  Laterality: N/A;  . CARDIAC CATHETERIZATION N/A 08/15/2016   Procedure: Coronary Stent Intervention;  Surgeon: Deborah Kida, MD;  Location: [place] [facility];  Service: Cardiovascular;  Laterality: N/A;  . CARDIAC CATHETERIZATION N/A 08/18/2016   Procedure: Right Heart Cath;  Surgeon: Deborah Artist, MD;  Location: [place] [facility];  Service: Cardiovascular;  Laterality: N/A;  . CARDIAC CATHETERIZATION N/A 08/18/2016   Procedure: IABP Insertion;  Surgeon: Deborah Artist, MD;  Location: [place] [facility];  Service: Cardiovascular;  Laterality: N/A;  . CARDIAC CATHETERIZATION Right 08/23/2016   Procedure: CENTRAL LINE INSERTION RIGHT SUBCLAVIAN;  Surgeon: Deborah Poot, MD;  Location: [place];  Service: Open Heart Surgery;  Laterality: Right;  . ENDOVEIN HARVEST OF GREATER SAPHENOUS VEIN Left 08/23/2016   Procedure: ENDOVEIN HARVEST OF GREATER SAPHENOUS VEIN;  Surgeon: Deborah Poot, MD;  Location: [place];  Service: Open Heart Surgery;  Laterality: Left;  . INSERTION OF DIALYSIS CATHETER Bilateral 08/31/2016   Procedure: INSERTION OF DIALYSIS CATHETER LEFT INTERNAL JUGULAR VEIN & INSERTION OF TRIPLE LUMEN RIGHT INTERNAL JUGULAR VEIN;  Surgeon: Deborah Mould, MD;  Location: [place];  Service: Vascular;  Laterality: Bilateral;  . INTRAOPERATIVE TRANSESOPHAGEAL ECHOCARDIOGRAM N/A 08/23/2016    Procedure: INTRAOPERATIVE TRANSESOPHAGEAL ECHOCARDIOGRAM;  Surgeon: Deborah Poot, MD;  Location: [place];  Service: Open Heart Surgery;  Laterality: N/A;  . MITRAL VALVE REPAIR N/A 08/23/2016   Procedure: MITRAL VALVE REPAIR (MVR) USING 25MM EDWARDS MAGNA EASE BIOPROSTHESIS MITRAL  VALVE;  Surgeon: Deborah Poot, MD;  Location: [place];  Service: Open Heart Surgery;  Laterality: N/A;  . VAGINAL DELIVERY     x 6   HPI:  60 yo female admitted to [facility] 08/15/16 with STEMI s/p cath to [place]. Developed cardiogenic shock, VDRF (ETT 9/14-9/29), AKI with metabolic acidosis and started on CRRT 08/17/16. 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 She also had fever and found to have Group B Strep in sputum culture.Mitral valve replacement 9/20. Swallow evaluation 9/30 with recs for NPO given poor mentation, severely impaired swallow function s/p prolonged intubation. Reintubated 9/30 secondary to septic shock; trach 10/5; new onset right hemiplegia and right gaze preference.  CT negative for acute changes; dx acute encephalopathy.    Assessment / Plan / Recommendation Clinical Impression  Pt with poor toleration of PMV - poor patency of upper airway with clear air trapping noted upon removal of valve.  Placed for short intervals 1-2 respiratory cycles with brief phonation achieved; removal followed by burst of air from cannula.  Repositioned in an effort to obtain better access to upper airway, but not successful.  Question size of trach and presence of deflated cuff, edema in upper airway preventing success.  SLP will follow for improved toleration, readiness for swallow evaluation.  D/W RN.     SLP Assessment  Patient needs continued  Speech Lanaguage Pathology Services    Follow Up Recommendations   PMV trials with SLP only    Frequency and Duration min 3x week  2 weeks    PMSV Trial PMSV was placed for: intervals of 1-2 respiratory cycles Able to  redirect subglottic air through upper airway: No Able to Attain Phonation:  (minimal) Voice Quality: Hoarse;Low vocal intensity Able to Expectorate Secretions: No attempts Breath Support for Phonation: Inadequate Intelligibility: Intelligibility reduced Word: 50-74% accurate Respirations During Trial: 22 SpO2 During Trial: 100 % Pulse During Trial: 93 Behavior: Alert   Tracheostomy Tube  Additional Tracheostomy Tube Assessment Fenestrated: No    Vent Dependency  Vent Dependent: No (on atc) FiO2 (%): 35 %    Cuff Deflation Trial  GO Deborah [name] Deborah [name], [place] CCC/SLP Pager [phone]  Tolerated Cuff Deflation: Yes (deflated upon entering room)        Deborah [name] 09/11/2016, 3:41 PM

## 2016-09-12 ENCOUNTER — Inpatient Hospital Stay (HOSPITAL_COMMUNITY): Payer: Medicaid Other

## 2016-09-12 DIAGNOSIS — Z952 Presence of prosthetic heart valve: Secondary | ICD-10-CM

## 2016-09-12 LAB — COMPREHENSIVE METABOLIC PANEL
ALT: 47 U/L (ref 14–54)
AST: 37 U/L (ref 15–41)
Albumin: 2.5 g/dL — ABNORMAL LOW (ref 3.5–5.0)
Alkaline Phosphatase: 121 U/L (ref 38–126)
Anion gap: 12 (ref 5–15)
BUN: 51 mg/dL — ABNORMAL HIGH (ref 6–20)
CO2: 18 mmol/L — ABNORMAL LOW (ref 22–32)
Calcium: 8.5 mg/dL — ABNORMAL LOW (ref 8.9–10.3)
Chloride: 103 mmol/L (ref 101–111)
Creatinine, Ser: 2.34 mg/dL — ABNORMAL HIGH (ref 0.44–1.00)
GFR calc Af Amer: 25 mL/min — ABNORMAL LOW (ref >60)
GFR calc non Af Amer: 22 mL/min — ABNORMAL LOW (ref >60)
Glucose, Bld: 112 mg/dL — ABNORMAL HIGH (ref 65–99)
Potassium: 3.6 mmol/L (ref 3.5–5.1)
Sodium: 133 mmol/L — ABNORMAL LOW (ref 135–145)
Total Bilirubin: 1 mg/dL (ref 0.3–1.2)
Total Protein: 6 g/dL — ABNORMAL LOW (ref 6.5–8.1)

## 2016-09-12 LAB — GLUCOSE, CAPILLARY
GLUCOSE-CAPILLARY: 142 mg/dL — AB (ref 65–99)
GLUCOSE-CAPILLARY: 158 mg/dL — AB (ref 65–99)
GLUCOSE-CAPILLARY: 206 mg/dL — AB (ref 65–99)
Glucose-Capillary: 83 mg/dL (ref 65–99)
Glucose-Capillary: 283 mg/dL — ABNORMAL HIGH (ref 65–99)
Glucose-Capillary: 163 mg/dL — ABNORMAL HIGH (ref 65–99)

## 2016-09-12 LAB — CBC
HCT: 29.4 % — ABNORMAL LOW (ref 36.0–46.0)
Hemoglobin: 8.8 g/dL — ABNORMAL LOW (ref 12.0–15.0)
MCH: 29.2 pg (ref 26.0–34.0)
MCHC: 29.9 g/dL — ABNORMAL LOW (ref 30.0–36.0)
MCV: 97.7 fL (ref 78.0–100.0)
Platelets: 266 10*3/uL (ref 150–400)
RBC: 3.01 MIL/uL — ABNORMAL LOW (ref 3.87–5.11)
RDW: 19.4 % — ABNORMAL HIGH (ref 11.5–15.5)
WBC: 18.7 10*3/uL — ABNORMAL HIGH (ref 4.0–10.5)

## 2016-09-12 LAB — HEPARIN LEVEL (UNFRACTIONATED): Heparin Unfractionated: 0.15 IU/mL — ABNORMAL LOW (ref 0.30–0.70)

## 2016-09-12 LAB — PHOSPHORUS: PHOSPHORUS: 3.3 mg/dL (ref 2.5–4.6)

## 2016-09-12 LAB — MAGNESIUM: MAGNESIUM: 2.4 mg/dL (ref 1.7–2.4)

## 2016-09-12 MED ORDER — HEPARIN (PORCINE) IN NACL 100-0.45 UNIT/ML-% IJ SOLN: 1000.0000 [IU]/h | INTRAMUSCULAR | Status: DC

## 2016-09-12 MED ORDER — NEPRO/CARBSTEADY PO LIQD
1000.0000 mL | ORAL | Status: DC
  Administered 2016-09-12 – 2016-09-17 (×6): 1000 mL
  Filled 2016-09-12 (×9): qty 1000

## 2016-09-12 MED ORDER — HEPARIN (PORCINE) IN NACL 100-0.45 UNIT/ML-% IJ SOLN
1100.0000 [IU]/h | INTRAMUSCULAR | Status: DC
  Administered 2016-09-12: 900 [IU]/h via INTRAVENOUS
  Administered 2016-09-13: 1000 [IU]/h via INTRAVENOUS
  Administered 2016-09-15: 1150 [IU]/h via INTRAVENOUS
  Filled 2016-09-12 (×3): qty 250

## 2016-09-12 MED ORDER — LEVALBUTEROL HCL 0.63 MG/3ML IN NEBU
0.6300 mg | INHALATION_SOLUTION | Freq: Four times a day (QID) | RESPIRATORY_TRACT | Status: DC | PRN
  Administered 2016-09-18: 0.63 mg via RESPIRATORY_TRACT
  Filled 2016-09-12: qty 3

## 2016-09-12 NOTE — Progress Notes (Addendum)
ANTICOAGULATION CONSULT NOTE   Pharmacy Consult for Heparin  Indication: atrial fibrillation  No Known Allergies  Patient Measurements: Height: 5\' 1"  (154.9 cm) Weight: 142 lb 13.7 oz (64.8 kg) IBW/kg (Calculated) : 47.8   Vital Signs: Temp: 98.5 F (36.9 C) (10/10 1938) Temp Source: Oral (10/10 1938) BP: 88/60 (10/10 1900) Pulse Rate: 96 (10/10 1900)  Labs:  Recent Labs  09/10/16 0457  09/11/16 0416 09/11/16 1953 09/12/16 0301 09/12/16 0302  HGB 8.8*  --  8.6*  --  8.8*  --   HCT 28.4*  --  28.4*  --  29.4*  --   PLT 236  --  226  --  266  --   LABPROT 17.3*  --  17.3*  --   --   --   INR 1.40  --  1.40  --   --   --   HEPARINUNFRC  --   --   --  0.12*  --  0.15*  CREATININE 1.23*  < > 1.19* 1.98* 2.34*  --   < > = values in this interval not displayed.  Estimated Creatinine Clearance: 22.3 mL/min (by C-G formula based on SCr of 2.34 mg/dL (H)).   Assessment: 60yof admitted for MVR - bioprosthetic, after AMI with stents 7/17.  She has been in Afib s/p DCCV last week. Heparin with CVVHD has maintained systemic anticoagulation.  CVVHD stopped 10/9 and heparin has been restarted at a low rate (given recent surgery and recent oozing from trach site last week).  Heparin increased to 1000 units/hr earlier today but then was held for central line placement. Spoke to RN and heparin to restart at 10:30pm tonight. Last heparin level was 0.15 and goal heparin level is now 0.2-0.3.    Goal of Therapy:  Heparin level= 0.2-0.3 per MD Monitor platelets by anticoagulation protocol: Yes   Plan:   -restart heparin at 900 units/hr at 10: 30pm -Heparin level in 8 hrs  Hildred Laser, Pharm D 09/12/2016 8:21 PM

## 2016-09-12 NOTE — Progress Notes (Signed)
Unable to obtain morning co-ox due to central line not drawing back blood. Will pass this information on to day shift RN. Richarda Blade RN

## 2016-09-12 NOTE — Op Note (Signed)
Procedure-placement right subclavian triple-lumen catheter Indication-central IV access Anesthesia-local 1% lidocaine Surgeon-Peter Prescott Gum M.D.  Percutaneous triple-lumen catheter placed sterile prep and drape and mask and gown. No locations. Postop chest x-ray shows catheter in good position without pneumothorax.  We'll resume daily Co- Ox measurements in a.m. Maintaining sinus rhythm

## 2016-09-12 NOTE — Care Management Note (Signed)
Case Management Note  Patient Details  Name: Deborah [name] MRN: [id number] Date of Birth: 1956-09-16   Subjective/Objective:    Copied from previous CM note from [place] 08/17/16            RNCM met with patient's son [name] [phone] and daughter [name] [phone] to discuss discharge planning. Patient is vented currently and son [name] is struggling with patient's current status. I and patient's RN spoke with him to try to comfort him- he appreciate it. [name] would like to be first line of contact for any medical needs. Patient is from home with [name]. She was independent with daily activity. Her PCP is Ronette Deter. Patient had been going to [facility] but per [name] "they were not meeting her mother's medical needs". [name] has been paying for physician visits for her mother. Patient is potentially going to need outpatient dialysis. She was not taking her medications per [name] due to cost- patient does not have health insurance. [name] wants her mother to have home health services- [facility] through [facility] for nursing may be appropriate however it is too soon to make those arrangements. Both children are considering SNF if patient too weak to return home. They have met with financial advisors about Medicare and Medicaid per Teresa/Frank.   Action/Plan: Application to [facility] and Medication Management shared with [name]. RNCM to continue to follow.   Expected Discharge Date:                  Expected Discharge Plan:  [place]  In-House Referral:     Discharge planning Services     Post Acute Care Choice:    Choice offered to:     DME Arranged:    DME Agency:     HH Arranged:    [place] Agency:     Status of Service:  In process, will continue to follow  If discussed at Long Length of Stay Meetings, dates discussed:    Additional Comments: 09/12/2016  Pt recommended for CIR - CSW consulted placed as backup.  CM  requested CIR consult via sticky notes.  09/04/16 Elenor Quinones, RN, BSN [phone] Pt is now extubated on [place], remains on CRRT, continuous Milrinone, Amiodarone, Precedex d/c today at extubation.   Per Development worker, community note ; medicaid application has been submitted  09/12/2016  CM spoke in depth with sister and brother in law - they feel that pt will beneift from post acute care facility due to lack of help in the home.  Per sister; pt stays with adult children but projected required help at home will not be sufficient at discharge.  CM discussed with family post acute discharge options including brief overview or LTACH and SNF in general (choice not given).  CM informed family that discharge determination will be followed closely and will be managed daily depending on progress.  Pt is now on vent, CRRT, pressors.  09/01/16 Elenor Quinones, RN, BSN [phone] Discussed in LOS 08/29/16:  Pt remains appropriate for continued stay.  Pt remains on CRRT and ventilator.  Per attending; If no significant improvement in 24-48hrs, anticipate pt will need a trache/PEG.  She still has volume issues and her decreased mental status make it difficult to wean/extubate safely.  08/24/16 Elenor Quinones, RN, BSN [phone] Pt now transferred to 2S s/p  MVR.  Postoperative management of balloon pump, vasopressors, and chest tube per CV S and cardiology. Patient remains  critically ill on ventilator - may need CRRT.  CM will continue to follow for discharge needs Maryclare Labrador, RN 09/12/2016, 9:17 AM

## 2016-09-12 NOTE — Progress Notes (Signed)
12 Days Post-Op Procedure(s) (LRB): INSERTION OF DIALYSIS CATHETER LEFT INTERNAL JUGULAR VEIN & INSERTION OF TRIPLE LUMEN RIGHT INTERNAL JUGULAR VEIN (Bilateral) Subjective: Off ventilator on trach collar 24 hours Currently in sinus rhythm with stable blood pressure Fluid balance positive, chest x-ray with mild edema Plan HD tomorrow Planned placement of new central line today Bleeding from trach site now on IV Lasix and Plavix Hold heparin today until central line placed  Objective: Vital signs in last 24 hours: Temp:  [97.7 F (36.5 C)-98.6 F (37 C)] 98.1 F (36.7 C) (10/10 0848) Pulse Rate:  [81-95] 94 (10/10 0815) Cardiac Rhythm: Normal sinus rhythm (10/10 0800) Resp:  [13-27] 22 (10/10 0815) BP: (86-236)/(54-202) 114/69 (10/10 0815) SpO2:  [96 %-100 %] 99 % (10/10 0815) FiO2 (%):  [28 %-35 %] 28 % (10/10 0815) Weight:  [142 lb 13.7 oz (64.8 kg)] 142 lb 13.7 oz (64.8 kg) (10/10 0500)  Hemodynamic parameters for last 24 hours: CVP:  [16 mmHg-35 mmHg] 23 mmHg  Intake/Output from previous day: 10/09 0701 - 10/10 0700 In: 1327.8 [I.V.:483.8; NG/GT:640; IV Piggyback:204] Out: 50  Intake/Output this shift: Total I/O In: 54.8 [I.V.:14.8; NG/GT:40] Out: -        Exam    General- alert and comfortable   Lungs- clear without rales, wheezes   Cor- regular rate and rhythm, no murmur , gallop   Abdomen- soft, non-tender   Extremities - warm, non-tender, minimal edema   Neuro- oriented, appropriate, no focal weakness   Lab Results:  Recent Labs  09/11/16 0416 09/12/16 0301  WBC 18.3* 18.7*  HGB 8.6* 8.8*  HCT 28.4* 29.4*  PLT 226 266   BMET:  Recent Labs  09/11/16 1953 09/12/16 0301  NA 134* 133*  K 3.6 3.6  CL 101 103  CO2 22 18*  GLUCOSE 155* 112*  BUN 37* 51*  CREATININE 1.98* 2.34*  CALCIUM 8.7* 8.5*    PT/INR:  Recent Labs  09/11/16 0416  LABPROT 17.3*  INR 1.40   ABG    Component Value Date/Time   PHART 7.541 (H) 09/08/2016 0412   HCO3  25.2 09/08/2016 0412   TCO2 26 09/08/2016 0412   ACIDBASEDEF 4.1 (H) 09/02/2016 1850   O2SAT 60.1 09/11/2016 0400   CBG (last 3)   Recent Labs  09/11/16 2358 09/12/16 0345 09/12/16 0845  GLUCAP 201* 83 158*    Assessment/Plan: S/P Procedure(s) (LRB): INSERTION OF DIALYSIS CATHETER LEFT INTERNAL JUGULAR VEIN & INSERTION OF TRIPLE LUMEN RIGHT INTERNAL JUGULAR VEIN (Bilateral) Finish 14 days of IV imipenem for Enterobacter HCAP Plan swallow study later of the week to see if patient can swallow pills Hemodialysis per renal Continue with physical therapy  LOS: 25 days    Deborah [name] 09/12/2016

## 2016-09-12 NOTE — Progress Notes (Signed)
Speech Language Pathology Treatment: [name] Speaking valve  Patient Details Name: Deborah [name] MRN: [id number] DOB: 1956-09-01 Today's Date: 09/12/2016 Time: 5053-9767 SLP Time Calculation (min) (ACUTE ONLY): 9 min  Assessment / Plan / Recommendation Clinical Impression   Pt's cuff was deflated upon SLP arrival. Pt was alert and repositioned for best posture for breath support. Pt's v/s were stable prior to placement of PMV. PMV was placed for 1 to 2 respiratory cycles and RR rate increased from 19 to 29 during the trial. Despite coughing, pt was unable to expel secretions and pt had very minimal phonation ability. She had poor patency of upper airway with clear air trapping noted upon removal of valve. Recommend PMV use only with SLP. Will continue to f/u for PMV tolerance.   HPI HPI: 60 yo female admitted to [facility] 08/15/16 with STEMI s/p cath to [place]. Developed cardiogenic shock, VDRF (ETT 9/14-9/29), AKI with metabolic acidosis and started on CRRT 08/17/16. 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 She also had fever and found to have Group B Strep in sputum culture.Mitral valve replacement 9/20. Swallow evaluation 9/30 with recs for NPO given poor mentation, severely impaired swallow function s/p prolonged intubation. Reintubated 9/30 secondary to septic shock; trach 10/5; new onset right hemiplegia and right gaze preference.  CT negative for acute changes; dx acute encephalopathy.       SLP Plan  Continue with current plan of care     Recommendations         Patient may use Passy-Muir Speech Valve: with SLP only PMSV Supervision: Full         Oral Care Recommendations: Oral care QID Follow up Recommendations: Skilled Nursing facility Plan: Continue with current plan of care       Adrian, Student SLP  Shela Leff 09/12/2016, 1:23 PM

## 2016-09-12 NOTE — Progress Notes (Signed)
PULMONARY / CRITICAL CARE MEDICINE   Name: Deborah [name] MRN: [id number] DOB: December 29, 1955    ADMISSION DATE:  08/18/2016 CONSULTATION DATE:  08/18/2016  REFERRING MD:  Bensimhon   CHIEF COMPLAINT:  Ventilator management and critical care services   HISTORY OF PRESENT ILLNESS:   60 yo female admitted to [facility] 08/15/16 with STEMI s/p cath to [place].  Developed cardiogenic shock, VDRF (ETT 9/14), AKI with metabolic acidosis and started on CRRT 08/17/16.  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  She also had fever and found to have Group B Strep in sputum culture. Mitral valve replacement 9/20.  SUBJECTIVE:  Tolerating ATC since yesterday.  VITAL SIGNS: BP (!) 117/57   Pulse 93   Temp 98.1 F (36.7 C) (Oral)   Resp 19   Ht 5\' 1"  (1.549 m)   Wt 142 lb 13.7 oz (64.8 kg)   SpO2 99%   BMI 26.99 kg/m   HEMODYNAMICS:    VENTILATOR SETTINGS: FiO2 (%):  [28 %-35 %] 28 %  INTAKE / OUTPUT: I/O last 3 completed shifts: In: 2125.4 [I.V.:661.4; NG/GT:1060; IV Piggyback:404] Out: 1008 [Other:858; Stool:150]  PHYSICAL EXAMINATION: General: No distress HENT: Trach in place, [place]/AT, PERRL, EOM-I and MMM Neuro: awake, alert, follows commands, nods appropriately, MAE, gen weakness  PULM: Clear, Non labored breathing, CV: RRR, systolic murmur noted GI: BS+, soft, nontender MSK: normal bulk and tone.    LABS:  BMET  Recent Labs Lab 09/11/16 0416 09/11/16 1953 09/12/16 0301  NA 138 134* 133*  K 3.5 3.6 3.6  CL 102 101 103  CO2 28 22 18*  BUN 31* 37* 51*  CREATININE 1.19* 1.98* 2.34*  GLUCOSE 103* 155* 112*    Electrolytes  Recent Labs Lab 09/10/16 0457  09/11/16 0416 09/11/16 1953 09/12/16 0301  CALCIUM 8.6*  < > 8.6* 8.7* 8.5*  MG 2.5*  --  2.5*  --  2.4  PHOS 2.4*  < > 1.7* 3.4 3.3  < > = values in this interval not displayed.  CBC  Recent Labs Lab 09/10/16 0457 09/11/16 0416 09/12/16 0301  WBC 19.3* 18.3* 18.7*   HGB 8.8* 8.6* 8.8*  HCT 28.4* 28.4* 29.4*  PLT 236 226 266   Coag's  Recent Labs Lab 09/06/16 1605 09/10/16 0457 09/11/16 0416  INR 1.84 1.40 1.40   Sepsis Markers  Recent Labs Lab 09/10/16 0736  LATICACIDVEN 0.89   ABG  Recent Labs Lab 09/06/16 0425 09/07/16 0420 09/08/16 0412  PHART 7.446 7.460* 7.541*  PCO2ART 37.4 35.2 29.4*  PO2ART 105.0 118* 120.0*   Liver Enzymes  Recent Labs Lab 09/10/16 0457  09/11/16 0416 09/11/16 1953 09/12/16 0301  AST 59*  --  33  --  37  ALT 101*  --  68*  --  47  ALKPHOS 149*  --  137*  --  121  BILITOT 1.0  --  0.8  --  1.0  ALBUMIN 2.8*  < > 2.7* 2.6* 2.5*  < > = values in this interval not displayed.  Cardiac Enzymes No results for input(s): TROPONINI, PROBNP in the last 168 hours.  Glucose  Recent Labs Lab 09/11/16 1204 09/11/16 1534 09/11/16 1939 09/11/16 2358 09/12/16 0345 09/12/16 0845  GLUCAP 124* 197* 155* 201* 83 158*   Imaging Dg Chest Port 1 View  Result Date: 09/12/2016 CLINICAL DATA:  Status post mitral valve replacement and CABG on August 23, 2016. Dialysis dependent renal failure. Tracheostomy patient. EXAM:  PORTABLE CHEST 1 VIEW COMPARISON:  Chest x-ray of September 10, 2016 FINDINGS: The lungs are adequately inflated. The interstitial markings are more conspicuous today. There are bilateral pleural effusions layering inferiorly and posteriorly which appear more conspicuous as well. The cardiac silhouette remains enlarged. The prosthetic mitral valve ring appears stable in position. The pulmonary vascularity is more engorged today. The tracheostomy appliance tip projects between the clavicular heads. The dialysis catheter tip projects at the junction of the middle and distal portions of the SVC. The right internal jugular large caliber catheter tip also lies in this region. The feeding tube tip projects below the inferior margin of the image. IMPRESSION: Mild interval increased prominence of the  pulmonary interstitium and bilateral pleural effusions consistent with worsening of CHF. Probable left lower lobe subsegmental atelectasis. The support tubes are in stable position. Electronically Signed   By: David  Martinique M.D.   On: 09/12/2016 08:17   STUDIES:  LHC 9/12: PCI to OM1, severe MR, EF 55 to 60% RHC 9/15: RA 5, RV 37/3/6, PA 38/18/26, PCWP 15, CI 1.9, PVR 3.3 WU TEE 9/15: EF 60 to 65%, flail motion MV with severe MR RUQ ultrasound 9/23 > Gallbladder wall thickening noted, suggestion of sludge, small R effusion, liver unremarkable but left lobe not well visulalized  MICROBIOLOGY: MRSA PCR 9/12:  Negative MRSA PCR 9/15:  Negative Tracheal Asp Ctx 9/15:  Strep agalactiae and enterobacter. Blood Ctx x2 9/15:  Negative  MRSA PCR 9/19:  Negative  Blood culture 9/24: coag neg staph 2/4  ANTIBIOTICS: Zosyn 9/16 - 9/17 Vancomycin 9/16 - 9/17 Rocephin 9/17 >> 9/24 Vancomycin 9/20 (surgical prophylaxis) Vanc 9/25 >off Zosyn 9/25 > off  Fluconazole 9/25 > 10/5 Imipenem 10/4>>>  SIGNIFICANT EVENTS: 9/12 Admit to [facility], PCI to [place] 9/14 ETT, CRRT 9/15 to [facility] 9/16 off insulin gtt 9/20 To OR for Mitral Valve Repair w/ bioprosthetic valve 9/28 To OR for permcath 9/29 Extubated. 9/30 Reintubated 10/5 trach   LINES/TUBES: Right IJ PICC 9/12 - 9/20 Rt femoral introducer 9/15 - 9/20 OETT 8.0 9/14 >> 9/29, 9/30 >>10/5 HD cath left IJ 9/14 >> R IJ Introducer/CVL/[place] 9/20 >>  9/25 IABP 9/15 >>9/22 Lt femoral a line 9/15 >> 9/25 Y Chest Tube/Mediastinal Chest Tube 9/20 >>out L brachial art line 9/25 > 9/28 R IJ CVL > 9/28 L IJ Permcath >  Perc trach (JY) 10/5>>>  ASSESSMENT / PLAN: 60 yo with STEMI, papillary muscle rupture with severe MR, AKI on CRRT, VDRF, Group B strep PNA, cardiogenic/septic shock, DM. S/P MVR w/ Bioprosthetic 9/20. Prolonged hospital course with vent dependence s/p tracheostomy  PULMONARY A: Acute Hypoxic Respiratory Failure - Secondary to acute  pulmonary edema, reintubated 9/30 Aspiration risk Trach site bleeding - resolved.  P:   Cont ATC daytime. OK to deflate cuff while on ATC. Will try to keep off vent for as long as able PCCM will follow intermittently for trach care.  Please call with any questions.   Marshell Garfinkel MD [facility] Pager [phone] If no answer or after 3pm call: [phone] 09/12/2016, 11:34 AM,

## 2016-09-12 NOTE — Progress Notes (Signed)
ANTICOAGULATION CONSULT NOTE   Pharmacy Consult for Heparin  Indication: atrial fibrillation  No Known Allergies  Patient Measurements: Height: 5\' 1"  (154.9 cm) Weight: 136 lb 14.5 oz (62.1 kg) IBW/kg (Calculated) : 47.8   Vital Signs: Temp: 98.2 F (36.8 C) (10/09 2359) Temp Source: Oral (10/09 2359) BP: 104/54 (10/10 0200) Pulse Rate: 87 (10/10 0200)  Labs:  Recent Labs  09/10/16 0457 09/10/16 1617 09/11/16 0416 09/11/16 1953 09/12/16 0301 09/12/16 0302  HGB 8.8*  --  8.6*  --  8.8*  --   HCT 28.4*  --  28.4*  --  29.4*  --   PLT 236  --  226  --  266  --   LABPROT 17.3*  --  17.3*  --   --   --   INR 1.40  --  1.40  --   --   --   HEPARINUNFRC  --   --   --  0.12*  --  0.15*  CREATININE 1.23* 1.20* 1.19* 1.98*  --   --     Estimated Creatinine Clearance: 25.8 mL/min (by C-G formula based on SCr of 1.98 mg/dL (H)).   Assessment: 60yof admitted for MVR - bioprosthetic, after AMI with stents 7/17.  She has been in Afib s/p DCCV last week. Heparin with CVVHD has maintained systemic anticoagulation.  CVVHD stopped 10/9 and heparin has been restarted at a low rate (given recent surgery and recent oozing from trach site last week).  Heparin level subtherapeutic (0.15) on gtt at 800 units/hr. No issues with line or bleeding reported per RN. CBC stable.  Goal of Therapy:  Heparin level 0.3-0.5 units/ml Monitor platelets by anticoagulation protocol: Yes   Plan:   -Increase heparin to 1000 units/hr -Heparin level in 8 hrs  Sherlon Handing, PharmD, BCPS Clinical pharmacist, pager [phone]  09/12/2016 3:40 AM

## 2016-09-12 NOTE — Progress Notes (Signed)
S: Nods no CO O:BP 111/62   Pulse 91   Temp 98.1 F (36.7 C) (Oral)   Resp (!) 21   Ht 5' 1"  (1.549 m)   Wt 64.8 kg (142 lb 13.7 oz)   SpO2 96%   BMI 26.99 kg/m   Intake/Output Summary (Last 24 hours) at 09/12/16 0755 Last data filed at 09/12/16 0700  Gross per 24 hour  Intake           1327.8 ml  Output               50 ml  Net           1277.8 ml   Weight change: 2.7 kg (5 lb 15.2 oz) WGN:FAOZH and alert.  Trached CVS:RRR 2/6 systolic M Resp: Decreased BS bases Abd: +BS NTND Ext: no edema NEURO: Follows commands Rt IJ temp cath Lt IJ HD cath   . amiodarone  200 mg Oral BID  . atorvastatin  20 mg Oral q1800  . chlorhexidine gluconate (MEDLINE KIT)  15 mL Mouth Rinse BID  . clopidogrel  75 mg Oral Daily  . darbepoetin (ARANESP) injection - NON-DIALYSIS  100 mcg Subcutaneous Q Mon-1800  . famotidine  20 mg Oral Daily  . feeding supplement (NEPRO CARB STEADY)  1,000 mL Per Tube Q24H  . feeding supplement (PRO-STAT SUGAR FREE 64)  60 mL Per Tube TID  . Gerhardt's butt cream   Topical BID  . imipenem-cilastatin  250 mg Intravenous Q12H  . insulin aspart  0-24 Units Subcutaneous Q4H  . insulin detemir  10 Units Subcutaneous BID  . mouth rinse  15 mL Mouth Rinse 10 times per day  . midodrine  10 mg Oral TID WC   Dg Abd Portable 1v  Result Date: 09/11/2016 CLINICAL DATA:  NG tube placement. EXAM: PORTABLE ABDOMEN - 1 VIEW COMPARISON:  09/08/2016 FINDINGS: Nasogastric tube coiled in the stomach with the tip projecting over the proximal body of the stomach. There is no bowel dilatation to suggest obstruction. There is no evidence of pneumoperitoneum, portal venous gas or pneumatosis. There are no pathologic calcifications along the expected course of the ureters. There is prior median sternotomy with mitral valve replacement.The osseous structures are unremarkable. IMPRESSION: Nasogastric tube coiled in the stomach with the tip projecting over the proximal body of the stomach.  Electronically Signed   By: Kathreen Devoid   On: 09/11/2016 09:14   BMET    Component Value Date/Time   NA 133 (L) 09/12/2016 0301   K 3.6 09/12/2016 0301   CL 103 09/12/2016 0301   CO2 18 (L) 09/12/2016 0301   GLUCOSE 112 (H) 09/12/2016 0301   BUN 51 (H) 09/12/2016 0301   CREATININE 2.34 (H) 09/12/2016 0301   CREATININE 1.41 (H) 10/04/2012 1631   CALCIUM 8.5 (L) 09/12/2016 0301   GFRNONAA 22 (L) 09/12/2016 0301   GFRAA 25 (L) 09/12/2016 0301   CBC    Component Value Date/Time   WBC 18.7 (H) 09/12/2016 0301   RBC 3.01 (L) 09/12/2016 0301   HGB 8.8 (L) 09/12/2016 0301   HCT 29.4 (L) 09/12/2016 0301   PLT 266 09/12/2016 0301   MCV 97.7 09/12/2016 0301   MCH 29.2 09/12/2016 0301   MCHC 29.9 (L) 09/12/2016 0301   RDW 19.4 (H) 09/12/2016 0301   LYMPHSABS 2.3 08/23/2016 0328   MONOABS 4.1 (H) 08/23/2016 0328   EOSABS 0.5 08/23/2016 0328   BASOSABS 0.0 08/23/2016 0328     Assessment:  1. Acute on CKD 3 in setting of cath/MVR, CABG X 1 2. SP MVR 3. MRSA bacteremia 4. DM 5. SP STEMI 6. Anemia on aranesp  Plan: 1. Will plan HD tomorrow  Ravindra Baranek T

## 2016-09-12 NOTE — Progress Notes (Signed)
Patient ID: Deborah [name], female   DOB: [birth year], 60 y.o.   MRN: [id number]   ADVANCED HF TEAM  60 y/o woman with DM2, HTN, HL and noncompliance presented to [facility] ER with infrerior posterior STEMI on 9/12. Taken to cath lab and found to have occluded small to moderate OM-1 branch. Underwent successful PCS with stent x 2 by Dr. Clayborn Bigness. Coronaries otherwise ok. EF 55-60% with severe MR confirmed by echo.   Post-cath developed respiratory failure and required intubation. Developed shock and renal failure with peak creatinine 3.4 (1.6 on admit). Transferred to [facility] for furthe management on 9/15.Marland Kitchen TEE showed normal LV (EF 60-65%) and normal RV function with ruptured posterior papillary muscle with severe posterior MR.  Underwent placement of Swan and IABP. S/P MVR 08/23/16.   Treated for group B Strep PNA.   Echo 9/23  RV and LV are normal EF 60%  Extubated 9/29.  However, picture consistent with septic shock on 9/30 so pressors started and she was re-intubated. S/P Trach 10/5.  Developed AFL S/P TEE DC-CV 09/04/16   Milrinone stopped 09/10/16, Coox fell to 30% -> 47.4% -> 60.1 with resumption.  No coox this am with clogged line.  Line being replaced today. CVP 11-12   Alert currently. Follows commands.  Plan on IHD tomorrow.  SBPs 110s   Scheduled Meds: . amiodarone  200 mg Oral BID  . atorvastatin  20 mg Oral q1800  . chlorhexidine gluconate (MEDLINE KIT)  15 mL Mouth Rinse BID  . clopidogrel  75 mg Oral Daily  . darbepoetin (ARANESP) injection - NON-DIALYSIS  100 mcg Subcutaneous Q Mon-1800  . famotidine  20 mg Oral Daily  . feeding supplement (NEPRO CARB STEADY)  1,000 mL Per Tube Q24H  . feeding supplement (PRO-STAT SUGAR FREE 64)  60 mL Per Tube TID  . Gerhardt's butt cream   Topical BID  . imipenem-cilastatin  250 mg Intravenous Q12H  . insulin aspart  0-24 Units Subcutaneous Q4H  . insulin detemir  10 Units Subcutaneous BID  . mouth rinse  15 mL Mouth Rinse 10 times per day  .  midodrine  10 mg Oral TID WC   Continuous Infusions: . sodium chloride    . sodium chloride 10 mL/hr at 09/11/16 0800  . milrinone 0.25 mcg/kg/min (09/12/16 0800)  . norepinephrine (LEVOPHED) Adult infusion Stopped (09/09/16 1700)  . dialysis replacement fluid (prismasate) 600 mL/hr at 09/11/16 0025  . dialysate (PRISMASATE) 1,000 mL/hr at 09/11/16 0452   PRN Meds:.Place/Maintain arterial line **AND** sodium chloride, fentaNYL (SUBLIMAZE) injection, HYDROcodone-acetaminophen, midazolam, ondansetron (ZOFRAN) IV, sodium chloride flush    Vitals:   09/12/16 0815 09/12/16 0848 09/12/16 0900 09/12/16 1000  BP: 114/69  (!) 101/55 (!) 117/57  Pulse: 94  92 93  Resp: (!) 22  (!) 23 19  Temp:  98.1 F (36.7 C)    TempSrc:  Oral    SpO2: 99%  99% 99%  Weight:      Height:        Intake/Output Summary (Last 24 hours) at 09/12/16 1158 Last data filed at 09/12/16 0900  Gross per 24 hour  Intake           1248.2 ml  Output                0 ml  Net           1248.2 ml    LABS: Basic Metabolic Panel:  Recent Labs  09/11/16 0416 09/11/16 1953 09/12/16  0301  NA 138 134* 133*  K 3.5 3.6 3.6  CL 102 101 103  CO2 28 22 18*  GLUCOSE 103* 155* 112*  BUN 31* 37* 51*  CREATININE 1.19* 1.98* 2.34*  CALCIUM 8.6* 8.7* 8.5*  MG 2.5*  --  2.4  PHOS 1.7* 3.4 3.3   Liver Function Tests:  Recent Labs  09/11/16 0416 09/11/16 1953 09/12/16 0301  AST 33  --  37  ALT 68*  --  47  ALKPHOS 137*  --  121  BILITOT 0.8  --  1.0  PROT 6.4*  --  6.0*  ALBUMIN 2.7* 2.6* 2.5*   No results for input(s): LIPASE, AMYLASE in the last 72 hours. CBC:  Recent Labs  09/11/16 0416 09/12/16 0301  WBC 18.3* 18.7*  HGB 8.6* 8.8*  HCT 28.4* 29.4*  MCV 96.9 97.7  PLT 226 266   Cardiac Enzymes: No results for input(s): CKTOTAL, CKMB, CKMBINDEX, TROPONINI in the last 72 hours. BNP: Invalid input(s): POCBNP D-Dimer: No results for input(s): DDIMER in the last 72 hours. Hemoglobin A1C: No  results for input(s): HGBA1C in the last 72 hours. Fasting Lipid Panel: No results for input(s): CHOL, HDL, LDLCALC, TRIG, CHOLHDL, LDLDIRECT in the last 72 hours. Thyroid Function Tests: No results for input(s): TSH, T4TOTAL, T3FREE, THYROIDAB in the last 72 hours.  Invalid input(s): FREET3 Anemia Panel: No results for input(s): VITAMINB12, FOLATE, FERRITIN, TIBC, IRON, RETICCTPCT in the last 72 hours.  PHYSICAL EXAM General: lying in bed, NAD Neck: RIJ TLC LIJ trialysis trach Lungs: Diminished bases. CV: Nondisplaced PMI.  Heart tachy, regular S1/S2, no S3/S4, 1/6 SEM USB.   Abdomen: Soft, NT, ND, no HSM. No bruits or masses. +BS  Neurologic: Sleepy.   Extremities: No clubbing or cyanosis. R and LLE SCDs.   TELEMETRY: Reviewed, NSR 70-80s   Assessment:   1. Cardiogenic shock: Due to acute severe MR. EF 70% on 9/25 echo.  2. CAD: Inferior posterior STEMI on 9/13 with DES x 2 to OM-1      --Plavix restarted on 9/22 3. 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     --s/p MVR 08/23/2016  4. AKI on CKD stage III - due to ATN likely from cardiogenic shock + contrast with cath. Has required CVVH.  5. Acute respiratory failure: Pulmonary edema.  6. DM2 7. Suspect component of septic shock: Possible PNA, group B Strep in sputum culture.  8. Anemia 9. PVCs   10. Shock liver 11. MRSA bacteremia 12. Atrial fibrillation: Paroxysmal.  On amiodarone.    Plan/Discussion:    Stable today. . S/p trach now on trach collar.   Coox pending central line replacement. On milrinone 0.25 mcg/kg/min. Continue midodrine to support BP.   In NSR this am. Rates in 80-90s Continue po amio.   Continue PT and broad spectrum abx. Back on Plavix with recent stent.   Planning IHD tomorrow.   Shirley Friar, PA-C 09/12/2016   Advanced Heart Failure Team Pager [phone] (M-F; 7a - 4p)  Please contact [facility]  for night-coverage after hours (4p -7a ) and weekends on [email or web address]   Patient seen and examined with Oda Kilts, PA-C. We discussed all aspects of the encounter. I agree with the assessment and plan as stated above.   Continues to improve but back on milrinone due to low co-ox. BP better on midodrine. Maintaining NSR on po amio. Will start warfarin tomorrow. Continue Plavix for recent stent.  Remains HD dependent but made 65 cc urine yesterday. Will continue to follow for return of any renal function. Continue trach collar. Continue to work with PT. May need [facility].   , ,MD 5:47 PM

## 2016-09-13 ENCOUNTER — Inpatient Hospital Stay (HOSPITAL_COMMUNITY): Payer: Medicaid Other

## 2016-09-13 LAB — COMPREHENSIVE METABOLIC PANEL
ALT: 27 U/L (ref 14–54)
AST: 21 U/L (ref 15–41)
Albumin: 2.4 g/dL — ABNORMAL LOW (ref 3.5–5.0)
Alkaline Phosphatase: 111 U/L (ref 38–126)
Anion gap: 11 (ref 5–15)
BUN: 93 mg/dL — ABNORMAL HIGH (ref 6–20)
CO2: 23 mmol/L (ref 22–32)
Calcium: 8.6 mg/dL — ABNORMAL LOW (ref 8.9–10.3)
Chloride: 101 mmol/L (ref 101–111)
Creatinine, Ser: 3.68 mg/dL — ABNORMAL HIGH (ref 0.44–1.00)
GFR calc Af Amer: 14 mL/min — ABNORMAL LOW (ref >60)
GFR calc non Af Amer: 13 mL/min — ABNORMAL LOW (ref >60)
Glucose, Bld: 175 mg/dL — ABNORMAL HIGH (ref 65–99)
Potassium: 3.4 mmol/L — ABNORMAL LOW (ref 3.5–5.1)
Sodium: 135 mmol/L (ref 135–145)
Total Bilirubin: 1.3 mg/dL — ABNORMAL HIGH (ref 0.3–1.2)
Total Protein: 6.4 g/dL — ABNORMAL LOW (ref 6.5–8.1)

## 2016-09-13 LAB — GLUCOSE, CAPILLARY
GLUCOSE-CAPILLARY: 130 mg/dL — AB (ref 65–99)
GLUCOSE-CAPILLARY: 156 mg/dL — AB (ref 65–99)
GLUCOSE-CAPILLARY: 167 mg/dL — AB (ref 65–99)
GLUCOSE-CAPILLARY: 194 mg/dL — AB (ref 65–99)
Glucose-Capillary: 185 mg/dL — ABNORMAL HIGH (ref 65–99)
Glucose-Capillary: 102 mg/dL — ABNORMAL HIGH (ref 65–99)

## 2016-09-13 LAB — CBC
HCT: 26.8 % — ABNORMAL LOW (ref 36.0–46.0)
Hemoglobin: 8.5 g/dL — ABNORMAL LOW (ref 12.0–15.0)
MCH: 29.9 pg (ref 26.0–34.0)
MCHC: 31.7 g/dL (ref 30.0–36.0)
MCV: 94.4 fL (ref 78.0–100.0)
Platelets: 324 10*3/uL (ref 150–400)
RBC: 2.84 MIL/uL — ABNORMAL LOW (ref 3.87–5.11)
RDW: 19.2 % — ABNORMAL HIGH (ref 11.5–15.5)
WBC: 18.4 10*3/uL — ABNORMAL HIGH (ref 4.0–10.5)

## 2016-09-13 LAB — CARBOXYHEMOGLOBIN
CARBOXYHEMOGLOBIN: 1.6 % — AB (ref 0.5–1.5)
METHEMOGLOBIN: 1.2 % (ref 0.0–1.5)
O2 SAT: 54.7 %
Total hemoglobin: 8.3 g/dL — ABNORMAL LOW (ref 12.0–16.0)

## 2016-09-13 LAB — HEPARIN LEVEL (UNFRACTIONATED)
Heparin Unfractionated: 0.29 IU/mL — ABNORMAL LOW (ref 0.30–0.70)
Heparin Unfractionated: 0.11 IU/mL — ABNORMAL LOW (ref 0.30–0.70)

## 2016-09-13 LAB — PHOSPHORUS: PHOSPHORUS: 4 mg/dL (ref 2.5–4.6)

## 2016-09-13 MED ORDER — WARFARIN SODIUM 5 MG PO TABS
5.0000 mg | ORAL_TABLET | Freq: Once | ORAL | Status: AC
  Administered 2016-09-13: 5 mg via ORAL
  Filled 2016-09-13: qty 1

## 2016-09-13 MED ORDER — WARFARIN - PHARMACIST DOSING INPATIENT: Freq: Every day | Status: DC

## 2016-09-13 MED ORDER — ADULT MULTIVITAMIN LIQUID CH
15.0000 mL | Freq: Every day | ORAL | Status: DC
  Administered 2016-09-13 – 2016-10-11 (×25): 15 mL
  Filled 2016-09-13 (×28): qty 15

## 2016-09-13 NOTE — Progress Notes (Addendum)
ANTICOAGULATION CONSULT NOTE - Follow Up Consult  Pharmacy Consult for Heparin; add Coumadin Indication: Aflutter  No Known Allergies  Patient Measurements: Height: 5\' 1"  (154.9 cm) Weight: 143 lb 8.3 oz (65.1 kg) IBW/kg (Calculated) : 47.8 Heparin Dosing Weight: 61kg  Vital Signs: Temp: 97.9 F (36.6 C) (10/11 1146) Temp Source: Oral (10/11 1146) BP: 116/72 (10/11 0839) Pulse Rate: 95 (10/11 0700)  Labs:  Recent Labs  09/11/16 0416 09/11/16 1953 09/12/16 0301 09/12/16 0302 09/13/16 0422 09/13/16 0640  HGB 8.6*  --  8.8*  --  8.5*  --   HCT 28.4*  --  29.4*  --  26.8*  --   PLT 226  --  266  --  324  --   LABPROT 17.3*  --   --   --   --   --   INR 1.40  --   --   --   --   --   HEPARINUNFRC  --  0.12*  --  0.15*  --  0.29*  CREATININE 1.19* 1.98* 2.34*  --  3.68*  --     Estimated Creatinine Clearance: 14.2 mL/min (by C-G formula based on SCr of 3.68 mg/dL (H)).   Medications:  Heparin @ 900 units/hr  Assessment: 57 yof continues on heparin for new aflutter.  Heparin level is therapeutic at 0.29. She will begin coumadin today. Baseline INR from 10/9 = 1.4. LFTs have returned to normal. Coumadin score = 6. On po amiodarone which was started 10/8.   Goal of Therapy:  Heparin level 0.2-0.3 units/ml Monitor platelets by anticoagulation protocol: Yes   Plan:  1) Continue heparin at 900 units/hr 2) Check 8 hour heparin level to confirm 3) Coumadin 5mg  x 1 4) Daily INR  Deboraha Sprang 09/13/2016,1:16 PM

## 2016-09-13 NOTE — Evaluation (Signed)
Occupational Therapy Evaluation Patient Details Name: Deborah [name] MRN: [id number] DOB: 1956-04-15 Today's Date: 09/13/2016    History of Present Illness 60 yo admitted to [facility] 9/12 with STEMI develped cardiogenic shock with ETT and cRRT initiated on 9/14 after cath, transfer to [facility] 6/28, metabolic acidosis, underwent MVR 9/20, extubated 9/29, reintubated 9/30, 9/25-9/29 hemiplegia developed and resolved, trach and bronchoscopy 10/5. PMHx: DM, HTN, HLD   Clinical Impression   Pt admitted with above. She demonstrates the below listed deficits and will benefit from continued OT to maximize safety and independence with BADLs.  Pt presents to OT with generalized weakness, decreased activity tolerance, impaired balance, impaired cognition.  She currently requires max - total A for ADLs and mod A +2 for functional transfers.  Feel she would benefit from CIR.        Follow Up Recommendations  CIR;Supervision/Assistance - 24 hour    Equipment Recommendations  3 in 1 bedside comode    Recommendations for Other Services Rehab consult     Precautions / Restrictions Precautions Precautions: Sternal;Fall Precaution Comments: trach, flexiseal Restrictions Weight Bearing Restrictions: No      Mobility Bed Mobility Overal bed mobility: (P) Needs Assistance Bed Mobility: (P) Supine to Sit     Supine to sit: (P) Max assist;+2 for physical assistance     General bed mobility comments: (P) Pt requires step by step cues for sequencing.  And assist to move LEs off bed and to lift trunk   Transfers Overall transfer level: (P) Needs assistance Equipment used: (P) 2 person hand held assist Transfers: (P) Sit to/from Stand;Stand Pivot Transfers Sit to Stand: (P) Mod assist;+2 physical assistance Stand pivot transfers: (P) +2 physical assistance;Mod assist       General transfer comment: (P) Pt stood from bed x 1 and stood from chair x1.  She requires assist to lift hips from bed and  assist to maintain balance while pivoting to chair     Balance Overall balance assessment: (P) Needs assistance Sitting-balance support: Feet supported Sitting balance-Leahy Scale: Poor Sitting balance - Comments: Pt sat EOB x 11 mins with min A and up to 2 mins with min guard assist    Standing balance support: Bilateral upper extremity supported Standing balance-Leahy Scale: Zero                              ADL Overall ADL's : Needs assistance/impaired Eating/Feeding: NPO   Grooming: Wash/dry hands;Wash/dry face;Moderate assistance;Sitting   Upper Body Bathing: Maximal assistance;Sitting   Lower Body Bathing: Total assistance;Sit to/from stand   Upper Body Dressing : Total assistance;Sitting   Lower Body Dressing: Total assistance;Sit to/from stand   Toilet Transfer: Maximal assistance;+2 for physical assistance;Stand-pivot;BSC   Toileting- Clothing Manipulation and Hygiene: Total assistance;Sit to/from stand       Functional mobility during ADLs: Maximal assistance;+2 for physical assistance       Vision     Perception     Praxis      Pertinent Vitals/Pain Pain Assessment: Faces Faces Pain Scale: (P) No hurt     Hand Dominance Right   Extremity/Trunk Assessment Upper Extremity Assessment Upper Extremity Assessment: RUE deficits/detail;LUE deficits/detail RUE Deficits / Details: Rt UE grossly 3+/5 LUE Deficits / Details: Grossly 3-/5   Lower Extremity Assessment Lower Extremity Assessment: Defer to PT evaluation   Cervical / Trunk Assessment Cervical / Trunk Assessment: Kyphotic;Other exceptions Cervical / Trunk Exceptions: decreased trunk control  Communication Communication Communication: Tracheostomy   Cognition Arousal/Alertness: (P) Awake/alert Behavior During Therapy: (P) Flat affect Overall Cognitive Status: (P) Impaired/Different from baseline Area of Impairment: Attention;Following commands;Safety/judgement;Problem  solving   Current Attention Level: (P) Sustained   Following Commands: (P) Follows one step commands consistently;Follows one step commands inconsistently (requires cues) Safety/Judgement: (P) Decreased awareness of safety   Problem Solving: (P) Slow processing;Requires verbal cues;Difficulty sequencing     General Comments       Exercises Exercises: General Upper Extremity     Shoulder Instructions      Home Living Family/patient expects to be discharged to:: Unsure Living Arrangements: Alone Available Help at Discharge: Family;Available PRN/intermittently Type of Home: House                                  Prior Functioning/Environment Level of Independence: Independent                 OT Problem List: Decreased strength;Decreased activity tolerance;Impaired balance (sitting and/or standing);Decreased cognition;Decreased safety awareness;Decreased knowledge of use of DME or AE;Decreased knowledge of precautions;Cardiopulmonary status limiting activity;Impaired UE functional use;Pain   OT Treatment/Interventions: Self-care/ADL training;Therapeutic exercise;Energy conservation;DME and/or AE instruction;Therapeutic activities;Cognitive remediation/compensation;Patient/family education;Balance training    OT Goals(Current goals can be found in the care plan section) Acute Rehab OT Goals Patient Stated Goal: Pt unable to state  OT Goal Formulation: With patient Time For Goal Achievement: 09/27/16 Potential to Achieve Goals: Good ADL Goals Pt Will Perform Grooming: with supervision;sitting Pt Will Perform Upper Body Bathing: with supervision;sitting Pt Will Perform Lower Body Bathing: with mod assist;sit to/from stand Pt Will Transfer to Toilet: with min assist;stand pivot transfer;bedside commode Pt Will Perform Toileting - Clothing Manipulation and hygiene: with min assist;sit to/from stand Pt/caregiver will Perform Home Exercise Program: Right Upper  extremity;Increased strength;Left upper extremity;With Supervision;With written HEP provided  OT Frequency: Min 3X/week   Barriers to D/C: Decreased caregiver support          Co-evaluation PT/OT/SLP Co-Evaluation/Treatment: Yes Reason for Co-Treatment: Complexity of the patient's impairments (multi-system involvement) PT goals addressed during session: Balance;Strengthening/ROM OT goals addressed during session: ADL's and self-care;Strengthening/ROM      End of Session Equipment Utilized During Treatment: Oxygen Nurse Communication: Mobility status  Activity Tolerance: Patient limited by fatigue Patient left: in chair;with call bell/phone within reach;with chair alarm set;with family/visitor present   Time: 1040-1110 OT Time Calculation (min): 30 min Charges:  OT General Charges $OT Visit: 1 Procedure OT Evaluation $OT Eval Moderate Complexity: 1 Procedure G-Codes:    Cashtyn Pouliot M 2016/09/18, 1:53 PM

## 2016-09-13 NOTE — Progress Notes (Signed)
Physical Therapy Treatment Patient Details Name: Deborah [name] MRN: [id number] DOB: 03/01/1956 Today's Date: 09/13/2016    History of Present Illness 60 yo admitted to [facility] 9/12 with STEMI develped cardiogenic shock with ETT and cRRT initiated on 9/14 after cath, transfer to [facility] 8/29, metabolic acidosis, underwent MVR 9/20, extubated 9/29, reintubated 9/30, 9/25-9/29 hemiplegia developed and resolved, trach and bronchoscopy 10/5. PMHx: DM, HTN, HLD    PT Comments    Pt progressing, tolerated session well, requiring decr assist today overall; continue to recommend CIR  Follow Up Recommendations  CIR;Supervision/Assistance - 24 hour     Equipment Recommendations  Rolling walker with 5" wheels    Recommendations for Other Services       Precautions / Restrictions Precautions Precautions: Sternal;Fall Precaution Comments: trach, flexiseal Restrictions Weight Bearing Restrictions: No    Mobility  Bed Mobility Overal bed mobility: Needs Assistance Bed Mobility: Supine to Sit     Supine to sit: Max assist;+2 for physical assistance     General bed mobility comments: Pt requires step by step cues for sequencing.  And assist to move LEs off bed and to lift trunk   Transfers Overall transfer level: Needs assistance Equipment used: 2 person hand held assist Transfers: Sit to/from [facility] Sit to Stand: Mod assist;+2 physical assistance Stand pivot transfers: +2 physical assistance;Mod assist       General transfer comment: Pt stood from bed x 1 and stood from chair x1.  She requires assist to lift hips from bed and assist to maintain balance while pivoting to chair   Ambulation/Gait                 Stairs            Wheelchair Mobility    Modified Rankin (Stroke Patients Only)       Balance Overall balance assessment: Needs assistance Sitting-balance support: Feet supported Sitting balance-Leahy Scale: Poor Sitting balance -  Comments: Pt sat EOB x 11 mins with min A and up to 2 mins with min guard assist; multi-directional LOB initially, requires incr time to find and maintain midline   Standing balance support: Bilateral upper extremity supported Standing balance-Leahy Scale: Zero                      Cognition Arousal/Alertness: Awake/alert Behavior During Therapy: Flat affect Overall Cognitive Status: Impaired/Different from baseline Area of Impairment: Attention;Following commands;Safety/judgement;Problem solving   Current Attention Level: Sustained   Following Commands: Follows one step commands consistently;Follows one step commands inconsistently (requires cues) Safety/Judgement: Decreased awareness of safety   Problem Solving: Slow processing;Requires verbal cues;Difficulty sequencing      Exercises General Exercises - Upper Extremity Shoulder Flexion: AAROM;Right;10 reps;Seated    General Comments General comments (skin integrity, edema, etc.): VSS      Pertinent Vitals/Pain Pain Assessment: Faces Faces Pain Scale: No hurt    Home Living Family/patient expects to be discharged to:: Unsure Living Arrangements: Alone Available Help at Discharge: Family;Available PRN/intermittently Type of Home: House              Prior Function Level of Independence: Independent          PT Goals (current goals can now be found in the care plan section) Acute Rehab PT Goals Patient Stated Goal: Pt unable to state  PT Goal Formulation: Patient unable to participate in goal setting Time For Goal Achievement: 09/22/16 Potential to Achieve Goals: Fair Progress towards PT goals: Progressing toward  goals    Frequency    Min 3X/week      PT Plan Frequency needs to be updated;Current plan remains appropriate    Co-evaluation PT/OT/SLP Co-Evaluation/Treatment: Yes Reason for Co-Treatment: Complexity of the patient's impairments (multi-system involvement) PT goals addressed during  session: Balance;Strengthening/ROM OT goals addressed during session: ADL's and self-care;Strengthening/ROM     End of Session   Activity Tolerance: Patient tolerated treatment well Patient left: in chair;with call bell/phone within reach;with chair alarm set     Time: 1045-1110 PT Time Calculation (min) (ACUTE ONLY): 25 min  Charges:  $Therapeutic Activity: 8-22 mins                    G Codes:      Thora Scherman September 15, 2016, 1:55 PM

## 2016-09-13 NOTE — Progress Notes (Signed)
      [address]       [place] [address]             [phone]      Evening rounds  Resting comfortbaly  On trach collar and dialysis  BP 93/70   Pulse (!) 108   Temp 97.4 F (36.3 C) (Oral)   Resp (!) 29   Ht 5\' 1"  (1.549 m)   Wt 143 lb 8.3 oz (65.1 kg)   SpO2 100%   BMI 27.12 kg/m    Intake/Output Summary (Last 24 hours) at 09/13/16 1809 Last data filed at 09/13/16 1400  Gross per 24 hour  Intake           1236.7 ml  Output                0 ml  Net           1236.7 ml   Continue current care  Sylis Ketchum C. Roxan Hockey, MD [facility] [phone]

## 2016-09-13 NOTE — Progress Notes (Signed)
Dialysis treatment completed.  2500 mL ultrafiltrated.  2000 mL net fluid removal.  Patient status unchanged. Lung sounds diminished to ausculation in all fields. Generalized edema. Cardiac: ST.  Cleansed LIJ catheter with chlorhexidine.  Disconnected lines and flushed ports with saline per protocol.  Ports locked with heparin and capped per protocol.    Report given to bedside, RN LiZ.

## 2016-09-13 NOTE — Progress Notes (Signed)
S:  no CO O:BP 106/61   Pulse 95   Temp 98.4 F (36.9 C) (Oral)   Resp (!) 24   Ht 5' 1"  (1.549 m)   Wt 65.1 kg (143 lb 8.3 oz)   SpO2 93%   BMI 27.12 kg/m   Intake/Output Summary (Last 24 hours) at 09/13/16 0723 Last data filed at 09/13/16 0700  Gross per 24 hour  Intake           1372.9 ml  Output                0 ml  Net           1372.9 ml   Weight change: 0.3 kg (10.6 oz) EFE:OFHQR and alert.  Trached CVS:RRR 2/6 systolic M Resp: Decreased BS bases Abd: +BS NTND Ext: no edema NEURO: Follows commands Rt IJ temp cath Lt IJ HD cath   . amiodarone  200 mg Oral BID  . atorvastatin  20 mg Oral q1800  . chlorhexidine gluconate (MEDLINE KIT)  15 mL Mouth Rinse BID  . clopidogrel  75 mg Oral Daily  . darbepoetin (ARANESP) injection - NON-DIALYSIS  100 mcg Subcutaneous Q Mon-1800  . famotidine  20 mg Oral Daily  . feeding supplement (NEPRO CARB STEADY)  1,000 mL Per Tube Q24H  . feeding supplement (PRO-STAT SUGAR FREE 64)  60 mL Per Tube TID  . Gerhardt's butt cream   Topical BID  . imipenem-cilastatin  250 mg Intravenous Q12H  . insulin aspart  0-24 Units Subcutaneous Q4H  . insulin detemir  10 Units Subcutaneous BID  . mouth rinse  15 mL Mouth Rinse 10 times per day  . midodrine  10 mg Oral TID WC   Dg Chest Port 1 View  Result Date: 09/12/2016 CLINICAL DATA:  Central line placement EXAM: PORTABLE CHEST 1 VIEW COMPARISON:  09/12/2016 FINDINGS: Tracheostomy remains in good position. Left jugular dual-lumen catheter remains in the SVC. Right jugular dual-lumen catheter in the SVC. New right subclavian central venous catheter in the right atrium. No pneumothorax. Bilateral airspace disease compatible with edema and small bilateral effusions unchanged. Bibasilar atelectasis unchanged. Feeding tube enters the stomach with the tip not visualized IMPRESSION: Right subclavian catheter has been placed since the prior study with the tip in the right atrium. No pneumothorax  Pulmonary edema with bilateral effusions and bibasilar atelectasis unchanged. Electronically Signed   By: Franchot Gallo M.D.   On: 09/12/2016 19:49   Dg Chest Port 1 View  Result Date: 09/12/2016 CLINICAL DATA:  Status post mitral valve replacement and CABG on August 23, 2016. Dialysis dependent renal failure. Tracheostomy patient. EXAM: PORTABLE CHEST 1 VIEW COMPARISON:  Chest x-ray of September 10, 2016 FINDINGS: The lungs are adequately inflated. The interstitial markings are more conspicuous today. There are bilateral pleural effusions layering inferiorly and posteriorly which appear more conspicuous as well. The cardiac silhouette remains enlarged. The prosthetic mitral valve ring appears stable in position. The pulmonary vascularity is more engorged today. The tracheostomy appliance tip projects between the clavicular heads. The dialysis catheter tip projects at the junction of the middle and distal portions of the SVC. The right internal jugular large caliber catheter tip also lies in this region. The feeding tube tip projects below the inferior margin of the image. IMPRESSION: Mild interval increased prominence of the pulmonary interstitium and bilateral pleural effusions consistent with worsening of CHF. Probable left lower lobe subsegmental atelectasis. The support tubes are in stable position. Electronically Signed  By: David  Martinique M.D.   On: 09/12/2016 08:17   Dg Abd Portable 1v  Result Date: 09/11/2016 CLINICAL DATA:  NG tube placement. EXAM: PORTABLE ABDOMEN - 1 VIEW COMPARISON:  09/08/2016 FINDINGS: Nasogastric tube coiled in the stomach with the tip projecting over the proximal body of the stomach. There is no bowel dilatation to suggest obstruction. There is no evidence of pneumoperitoneum, portal venous gas or pneumatosis. There are no pathologic calcifications along the expected course of the ureters. There is prior median sternotomy with mitral valve replacement.The osseous  structures are unremarkable. IMPRESSION: Nasogastric tube coiled in the stomach with the tip projecting over the proximal body of the stomach. Electronically Signed   By: Kathreen Devoid   On: 09/11/2016 09:14   BMET    Component Value Date/Time   NA 135 09/13/2016 0422   K 3.4 (L) 09/13/2016 0422   CL 101 09/13/2016 0422   CO2 23 09/13/2016 0422   GLUCOSE 175 (H) 09/13/2016 0422   BUN 93 (H) 09/13/2016 0422   CREATININE 3.68 (H) 09/13/2016 0422   CREATININE 1.41 (H) 10/04/2012 1631   CALCIUM 8.6 (L) 09/13/2016 0422   GFRNONAA 13 (L) 09/13/2016 0422   GFRAA 14 (L) 09/13/2016 0422   CBC    Component Value Date/Time   WBC 18.4 (H) 09/13/2016 0422   RBC 2.84 (L) 09/13/2016 0422   HGB 8.5 (L) 09/13/2016 0422   HCT 26.8 (L) 09/13/2016 0422   PLT 324 09/13/2016 0422   MCV 94.4 09/13/2016 0422   MCH 29.9 09/13/2016 0422   MCHC 31.7 09/13/2016 0422   RDW 19.2 (H) 09/13/2016 0422   LYMPHSABS 2.3 08/23/2016 0328   MONOABS 4.1 (H) 08/23/2016 0328   EOSABS 0.5 08/23/2016 0328   BASOSABS 0.0 08/23/2016 0328     Assessment:  1. Acute on CKD 3 in setting of cath/MVR, CABG X 1 2. SP MVR 3. MRSA bacteremia 4. DM 5. SP STEMI 6. Anemia on aranesp  Plan: 1. Will plan HD today and 4K bath 2. Start MVI  Deborah [name] T

## 2016-09-13 NOTE — Progress Notes (Signed)
13 Days Post-Op Procedure(s) (LRB): INSERTION OF DIALYSIS CATHETER LEFT INTERNAL JUGULAR VEIN & INSERTION OF TRIPLE LUMEN RIGHT INTERNAL JUGULAR VEIN (Bilateral) Subjective: Remains on trach collar off ventilator Anxious to start taking oral intake-Will ask for swallow evaluation HD plan today, chest x-ray with mild edema Remains in sinus rhythm We'll start Coumadin when oral meds are safe-for now low-dose heparin infusion Continue IV imipenem for Enterobacter VAP  Objective: Vital signs in last 24 hours: Temp:  [97.7 F (36.5 C)-98.6 F (37 C)] 97.9 F (36.6 C) (10/11 1146) Pulse Rate:  [93-100] 95 (10/11 0700) Cardiac Rhythm: Normal sinus rhythm (10/11 0700) Resp:  [19-26] 24 (10/11 0700) BP: (88-129)/(55-74) 116/72 (10/11 0839) SpO2:  [90 %-100 %] 100 % (10/11 0839) FiO2 (%):  [28 %] 28 % (10/11 1155) Weight:  [143 lb 8.3 oz (65.1 kg)] 143 lb 8.3 oz (65.1 kg) (10/11 0500)  Hemodynamic parameters for last 24 hours:  stable Mixed venous saturation remains 55%, low-dose milrinone  Intake/Output from previous day: 10/10 0701 - 10/11 0700 In: 1372.9 [I.V.:432.9; NG/GT:740; IV Piggyback:200] Out: -  Intake/Output this shift: No intake/output data recorded.       Exam    General- alert and comfortable   Lungs- clear without rales, wheezes   Cor- regular rate and rhythm, no murmur , gallop   Abdomen- soft, non-tender   Extremities - warm, non-tender, minimal edema   Neuro- oriented, appropriate, no focal weakness   Lab Results:  Recent Labs  09/12/16 0301 09/13/16 0422  WBC 18.7* 18.4*  HGB 8.8* 8.5*  HCT 29.4* 26.8*  PLT 266 324   BMET:  Recent Labs  09/12/16 0301 09/13/16 0422  NA 133* 135  K 3.6 3.4*  CL 103 101  CO2 18* 23  GLUCOSE 112* 175*  BUN 51* 93*  CREATININE 2.34* 3.68*  CALCIUM 8.5* 8.6*    PT/INR:  Recent Labs  09/11/16 0416  LABPROT 17.3*  INR 1.40   ABG    Component Value Date/Time   PHART 7.541 (H) 09/08/2016 0412   HCO3  25.2 09/08/2016 0412   TCO2 26 09/08/2016 0412   ACIDBASEDEF 4.1 (H) 09/02/2016 1850   O2SAT 54.7 09/13/2016 0420   CBG (last 3)   Recent Labs  09/13/16 0349 09/13/16 0812 09/13/16 1144  GLUCAP 185* 194* 156*    Assessment/Plan: S/P Procedure(s) (LRB): INSERTION OF DIALYSIS CATHETER LEFT INTERNAL JUGULAR VEIN & INSERTION OF TRIPLE LUMEN RIGHT INTERNAL JUGULAR VEIN (Bilateral) Mobilize Diabetes control Hemodialysis today in ICU   LOS: 26 days    Tharon Aquas Trigt III 09/13/2016

## 2016-09-13 NOTE — Progress Notes (Signed)
Arrived to patient room 2S-10 at 1547.  Reviewed treatment plan and this RN agrees with plan.  Report received from bedside RN, Elmyra Ricks.  Consent verified.  Patient Alert, on trache collar.   Lung sounds rhonchi to ausculation in all fields. Generalized BLE edema. Cardiac:  NSR.  Removed caps and cleansed LIJ catheter with chlorhedxidine.  Aspirated ports of heparin and flushed them with saline per protocol.  Connected and secured lines, initiated treatment at 1600.  UF Goal of 2543mL and net fluid removal 2L.  Will continue to monitor.

## 2016-09-13 NOTE — Progress Notes (Signed)
Patient ID: Deborah [name], female   DOB: [birth year], 60 y.o.   MRN: [id number]   ADVANCED HF TEAM  60 y/o woman with DM2, HTN, HL and noncompliance presented to [facility] ER with infrerior posterior STEMI on 9/12. Taken to cath lab and found to have occluded small to moderate OM-1 branch. Underwent successful PCS with stent x 2 by Dr. Clayborn Bigness. Coronaries otherwise ok. EF 55-60% with severe MR confirmed by echo.   Post-cath developed respiratory failure and required intubation. Developed shock and renal failure with peak creatinine 3.4 (1.6 on admit). Transferred to [facility] for furthe management on 9/15.Marland Kitchen TEE showed normal LV (EF 60-65%) and normal RV function with ruptured posterior papillary muscle with severe posterior MR.  Underwent placement of Swan and IABP. S/P MVR 08/23/16.   Treated for group B Strep PNA.   Echo 9/23  RV and LV are normal EF 60%  Extubated 9/29.  However, picture consistent with septic shock on 9/30 so pressors started and she was re-intubated. S/P Trach 10/5.  Developed AFL S/P TEE DC-CV 09/04/16   Milrinone stopped 09/10/16, Coox fell to 30% -> 47.4% -> 60.1 with resumption.  Coox 54.7% on 0.25 mcg/kg/min milrinone. CVP 8-9  Awake and alert this am.  Straining to talk.  Remains extremely hoarse/very little audible voice.   Plan for IHD today with 4K bath.    Scheduled Meds: . amiodarone  200 mg Oral BID  . atorvastatin  20 mg Oral q1800  . chlorhexidine gluconate (MEDLINE KIT)  15 mL Mouth Rinse BID  . clopidogrel  75 mg Oral Daily  . darbepoetin (ARANESP) injection - NON-DIALYSIS  100 mcg Subcutaneous Q Mon-1800  . famotidine  20 mg Oral Daily  . feeding supplement (NEPRO CARB STEADY)  1,000 mL Per Tube Q24H  . feeding supplement (PRO-STAT SUGAR FREE 64)  60 mL Per Tube TID  . Gerhardt's butt cream   Topical BID  . imipenem-cilastatin  250 mg Intravenous Q12H  . insulin aspart  0-24 Units Subcutaneous Q4H  . insulin detemir  10 Units Subcutaneous BID  . mouth  rinse  15 mL Mouth Rinse 10 times per day  . midodrine  10 mg Oral TID WC  . multivitamin  15 mL Per Tube Daily   Continuous Infusions: . sodium chloride    . sodium chloride 10 mL/hr at 09/11/16 0800  . heparin 900 Units/hr (09/12/16 2222)  . milrinone 0.25 mcg/kg/min (09/13/16 0700)   PRN Meds:.Place/Maintain arterial line **AND** sodium chloride, fentaNYL (SUBLIMAZE) injection, HYDROcodone-acetaminophen, levalbuterol, midazolam, ondansetron (ZOFRAN) IV, sodium chloride flush    Vitals:   09/13/16 0600 09/13/16 0700 09/13/16 0815 09/13/16 0839  BP: (!) 103/56 106/61  116/72  Pulse: 96 95    Resp: (!) 23 (!) 24    Temp:   98.2 F (36.8 C)   TempSrc:   Oral   SpO2: 94% 93%  100%  Weight:      Height:        Intake/Output Summary (Last 24 hours) at 09/13/16 0926 Last data filed at 09/13/16 0700  Gross per 24 hour  Intake           1273.3 ml  Output                0 ml  Net           1273.3 ml    LABS: Basic Metabolic Panel:  Recent Labs  09/11/16 0416  09/12/16 0301 09/13/16 0422  NA 138  < >  133* 135  K 3.5  < > 3.6 3.4*  CL 102  < > 103 101  CO2 28  < > 18* 23  GLUCOSE 103*  < > 112* 175*  BUN 31*  < > 51* 93*  CREATININE 1.19*  < > 2.34* 3.68*  CALCIUM 8.6*  < > 8.5* 8.6*  MG 2.5*  --  2.4  --   PHOS 1.7*  < > 3.3 4.0  < > = values in this interval not displayed. Liver Function Tests:  Recent Labs  09/12/16 0301 09/13/16 0422  AST 37 21  ALT 47 27  ALKPHOS 121 111  BILITOT 1.0 1.3*  PROT 6.0* 6.4*  ALBUMIN 2.5* 2.4*   No results for input(s): LIPASE, AMYLASE in the last 72 hours. CBC:  Recent Labs  09/12/16 0301 09/13/16 0422  WBC 18.7* 18.4*  HGB 8.8* 8.5*  HCT 29.4* 26.8*  MCV 97.7 94.4  PLT 266 324   Cardiac Enzymes: No results for input(s): CKTOTAL, CKMB, CKMBINDEX, TROPONINI in the last 72 hours. BNP: Invalid input(s): POCBNP D-Dimer: No results for input(s): DDIMER in the last 72 hours. Hemoglobin A1C: No results for  input(s): HGBA1C in the last 72 hours. Fasting Lipid Panel: No results for input(s): CHOL, HDL, LDLCALC, TRIG, CHOLHDL, LDLDIRECT in the last 72 hours. Thyroid Function Tests: No results for input(s): TSH, T4TOTAL, T3FREE, THYROIDAB in the last 72 hours.  Invalid input(s): FREET3 Anemia Panel: No results for input(s): VITAMINB12, FOLATE, FERRITIN, TIBC, IRON, RETICCTPCT in the last 72 hours.  PHYSICAL EXAM General: lying in bed, NAD Neck: RIJ TLC LIJ trialysis trach Lungs: Diminished bases. CV: Nondisplaced PMI.  Heart tachy, regular S1/S2, no S3/S4, 1/6 SEM USB.   Abdomen: Soft, NT, ND, no HSM. No bruits or masses. +BS  Neurologic: Sleepy but nonfocal. Hoarse voice   Extremities: No clubbing or cyanosis. R and LLE SCDs.   TELEMETRY: Reviewed, NSR 70-80s   Assessment:   1. Cardiogenic shock: Due to acute severe MR. EF 70% on 9/25 echo.  2. CAD: Inferior posterior STEMI on 9/13 with DES x 2 to OM-1      --Plavix restarted on 9/22 3. 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     --s/p MVR 08/23/2016  4. AKI on CKD stage III - due to ATN likely from cardiogenic shock + contrast with cath. Has required CVVH.  5. Acute respiratory failure: Pulmonary edema.  6. DM2 7. Suspect component of septic shock: Possible PNA, group B Strep in sputum culture.  8. Anemia 9. PVCs   10. Shock liver 11. MRSA bacteremia 12. Atrial fibrillation: Paroxysmal.  On amiodarone.    Plan/Discussion:    Relatively stable. S/p trach now on trach collar.   Coox 54.7% this am on milrinone 0.25 mcg/kg/min. Continue midodrine to support BP.   NSR in 90s this am. Continue po amio. Load coumadin  Continue PT and broad spectrum abx. Back on Plavix with recent stent.   CVP 8-9. Creatinine 1.9 -> 2.3 -> 3.6. Planning IHD today with 4K bath. Renal following.    Shirley Friar, PA-C 09/13/2016   Advanced Heart Failure  Team Pager [phone] (M-F; 7a - 4p)  Please contact [facility] for night-coverage after hours (4p -7a ) and weekends on [email or web address]  Patient seen and examined with Oda Kilts, PA-C. We discussed all aspects of the encounter. I agree with the assessment and plan as stated above.   Overall stable.  Co-ox remains marginal on milrinone. Will continue for now but will have to wean if she will need outpatient HD as she will not be candidate for HD with milrinone. Will likely need [facility], Maintaining NSR. Continue po amio. Load warfarin. Bleeding from trach resolved. Continue midodrine for BP support. On Plavix for recent stent to small OM.  Needs PT/OT.   Bensimhon, Daniel,MD 2:56 PM

## 2016-09-13 NOTE — Progress Notes (Signed)
ANTICOAGULATION CONSULT NOTE - Follow Up Consult  Pharmacy Consult for Heparin; add Coumadin Indication: Aflutter  No Known Allergies  Patient Measurements: Height: 5\' 1"  (154.9 cm) Weight: 143 lb 8.3 oz (65.1 kg) IBW/kg (Calculated) : 47.8 Heparin Dosing Weight: 61kg  Vital Signs: Temp: 97.4 F (36.3 C) (10/11 1633) Temp Source: Oral (10/11 1633) BP: 92/68 (10/11 1645) Pulse Rate: 107 (10/11 1645)  Labs:  Recent Labs  09/11/16 0416  09/11/16 1953 09/12/16 0301 09/12/16 0302 09/13/16 0422 09/13/16 0640 09/13/16 1500  HGB 8.6*  --   --  8.8*  --  8.5*  --   --   HCT Deborah.4*  --   --  29.4*  --  26.8*  --   --   PLT 226  --   --  266  --  324  --   --   LABPROT 17.3*  --   --   --   --   --   --   --   INR 1.40  --   --   --   --   --   --   --   HEPARINUNFRC  --   < > 0.12*  --  0.15*  --  0.29* 0.11*  CREATININE 1.19*  --  1.98* 2.34*  --  3.68*  --   --   < > = values in this interval not displayed.  Estimated Creatinine Clearance: 14.2 mL/min (by C-G formula based on SCr of 3.68 mg/dL (H)).   Medications:  Heparin @ 900 units/hr  Assessment: Deborah [name] continues on heparin for new aflutter.  Heparin level is therapeutic at 0.29. She will begin coumadin today. Baseline INR from 10/9 = 1.4. LFTs have returned to normal. Coumadin score = 6. On po amiodarone which was started 10/8.   PM heparin level decreased to 0.11  Goal of Therapy:  Heparin level 0.2-0.3 units/ml Monitor platelets by anticoagulation protocol: Yes   Plan:  Heparin to 1000 units / hr Follow up AM labs  Thank you Anette Guarneri, PharmD [phone] 09/13/2016,4:54 PM

## 2016-09-14 ENCOUNTER — Inpatient Hospital Stay (HOSPITAL_COMMUNITY): Payer: Medicaid Other

## 2016-09-14 LAB — GLUCOSE, CAPILLARY
GLUCOSE-CAPILLARY: 148 mg/dL — AB (ref 65–99)
GLUCOSE-CAPILLARY: 226 mg/dL — AB (ref 65–99)
GLUCOSE-CAPILLARY: 281 mg/dL — AB (ref 65–99)
GLUCOSE-CAPILLARY: 182 mg/dL — AB (ref 65–99)

## 2016-09-14 LAB — CBC
HCT: 28.3 % — ABNORMAL LOW (ref 36.0–46.0)
Hemoglobin: 9 g/dL — ABNORMAL LOW (ref 12.0–15.0)
MCH: 29.8 pg (ref 26.0–34.0)
MCHC: 31.8 g/dL (ref 30.0–36.0)
MCV: 93.7 fL (ref 78.0–100.0)
Platelets: 361 10*3/uL (ref 150–400)
RBC: 3.02 MIL/uL — ABNORMAL LOW (ref 3.87–5.11)
RDW: 18.9 % — ABNORMAL HIGH (ref 11.5–15.5)
WBC: 20.5 10*3/uL — ABNORMAL HIGH (ref 4.0–10.5)

## 2016-09-14 LAB — RENAL FUNCTION PANEL
ALBUMIN: 2.5 g/dL — AB (ref 3.5–5.0)
ANION GAP: 9 (ref 5–15)
BUN: 29 mg/dL — AB (ref 6–20)
CHLORIDE: 101 mmol/L (ref 101–111)
CO2: 26 mmol/L (ref 22–32)
Calcium: 8.3 mg/dL — ABNORMAL LOW (ref 8.9–10.3)
Creatinine, Ser: 2.07 mg/dL — ABNORMAL HIGH (ref 0.44–1.00)
GFR, EST AFRICAN AMERICAN: 29 mL/min — AB (ref >60)
GFR, EST NON AFRICAN AMERICAN: 25 mL/min — AB (ref >60)
Glucose, Bld: 189 mg/dL — ABNORMAL HIGH (ref 65–99)
PHOSPHORUS: 2 mg/dL — AB (ref 2.5–4.6)
POTASSIUM: 3.9 mmol/L (ref 3.5–5.1)
Sodium: 136 mmol/L (ref 135–145)

## 2016-09-14 LAB — HEPARIN LEVEL (UNFRACTIONATED)
Heparin Unfractionated: 0.28 IU/mL — ABNORMAL LOW (ref 0.30–0.70)
Heparin Unfractionated: <0.1 IU/mL — ABNORMAL LOW (ref 0.30–0.70)

## 2016-09-14 LAB — COOXEMETRY PANEL
Carboxyhemoglobin: 1.8 % — ABNORMAL HIGH (ref 0.5–1.5)
METHEMOGLOBIN: 0.9 % (ref 0.0–1.5)
O2 Saturation: 51.6 %
TOTAL HEMOGLOBIN: 11.1 g/dL — AB (ref 12.0–16.0)

## 2016-09-14 LAB — PROTIME-INR
INR: 1.43
PROTHROMBIN TIME: 17.5 s — AB (ref 11.4–15.2)

## 2016-09-14 MED ORDER — QUETIAPINE FUMARATE 25 MG PO TABS
50.0000 mg | ORAL_TABLET | Freq: Every day | ORAL | Status: DC
  Administered 2016-09-14 – 2016-09-17 (×4): 50 mg via ORAL
  Filled 2016-09-14 (×4): qty 2

## 2016-09-14 MED ORDER — WARFARIN SODIUM 5 MG PO TABS
5.0000 mg | ORAL_TABLET | Freq: Once | ORAL | Status: AC
  Administered 2016-09-14: 5 mg via ORAL
  Filled 2016-09-14: qty 1

## 2016-09-14 NOTE — Progress Notes (Signed)
ANTICOAGULATION CONSULT NOTE - Follow Up Consult  Pharmacy Consult for Heparin Indication: Aflutter  No Known Allergies  Patient Measurements: Height: 5\' 1"  (154.9 cm) Weight: 143 lb 4.8 oz (65 kg) IBW/kg (Calculated) : 47.8 Heparin Dosing Weight: 61kg  Vital Signs: Temp: 97.6 F (36.4 C) (10/12 1933) Temp Source: Oral (10/12 1933) BP: 110/58 (10/12 1654) Pulse Rate: 114 (10/12 1939)  Labs:  Recent Labs  09/12/16 0301  09/13/16 0422  09/13/16 1500 09/14/16 0350 09/14/16 0715 09/14/16 1900  HGB 8.8*  --  8.5*  --   --  9.0*  --   --   HCT 29.4*  --  26.8*  --   --  28.3*  --   --   PLT 266  --  324  --   --  361  --   --   LABPROT  --   --   --   --   --  17.5*  --   --   INR  --   --   --   --   --  1.43  --   --   HEPARINUNFRC  --   < >  --   < > 0.11*  --  0.28* <0.10*  CREATININE 2.34*  --  3.68*  --   --  2.07*  --   --   < > = values in this interval not displayed.  Estimated Creatinine Clearance: 25.3 mL/min (by C-G formula based on SCr of 2.07 mg/dL (H)).   Medications:  Heparin @ 1000 units/hr  Assessment: 49 yof continues on heparin for new aflutter with orders 10/11 to bridge to Coumadin. PM heparin level = < 0.10, no problems with heparin infusing per RN report  Goal of Therapy:  Heparin level 0.2-0.3 units/ml  INR 2-2.5 Monitor platelets by anticoagulation protocol: Yes   Plan:  Heparin to 1150 units / hr Follow up AM labs  Thank you Anette Guarneri, PharmD [phone] 09/14/2016,7:40 PM

## 2016-09-14 NOTE — Progress Notes (Signed)
Speech Language Pathology Treatment: Deborah [name] Speaking valve  Patient Details Name: Deborah [name] MRN: [id number] DOB: 1956/10/05 Today's Date: 09/14/2016 Time: 8676-1950 SLP Time Calculation (min) (ACUTE ONLY): 8 min  Assessment / Plan / Recommendation Clinical Impression  Pt was upright in the chair with her cuff deflated upon SLP arrival. She tolerated PMV trials for 1-2 respiratory cycles. Placement of the PMV was brief given pt's body movements that displayed increased effortful breathing. No phonation or coughing was achieved with valve placed. SLP notes consistent air trapping when removing the valve. Recommend PMV use with SLP only. SLP spoke with NP about changing trach size.    HPI HPI: 60 yo female admitted to [facility] 08/15/16 with STEMI s/p cath to [place]. Developed cardiogenic shock, VDRF (ETT 9/14-9/29), AKI with metabolic acidosis and started on CRRT 08/17/16. Transferred to [facility] to tx shock and evaluated new severe MR from papillary muscle rupture. She also had fever and found to have Group B Strep in sputum culture.Mitral valve replacement 9/20. Swallow evaluation 9/30 with recs for NPO given poor mentation, severely impaired swallow function s/p prolonged intubation. Reintubated 9/30 secondary to septic shock; trach 10/5; new onset right hemiplegia and right gaze preference.  CT negative for acute changes; dx acute encephalopathy.       SLP Plan  Continue with current plan of care     Recommendations  Medication Administration: Via alternative means      Patient may use Passy-Muir Speech Valve: with SLP only PMSV Supervision: Full MD: Please consider changing trach tube to : Smaller size;Cuffless         Oral Care Recommendations: Oral care prior to ice chip/H20 Follow up Recommendations: Skilled Nursing facility Plan: Continue with current plan of care       Deborah [name], Student SLP  Deborah [name] 09/14/2016, 11:39 AM

## 2016-09-14 NOTE — Evaluation (Signed)
Clinical/Bedside Swallow Evaluation Patient Details  Name: Deborah [name] MRN: [id number] Date of Birth: [birth year]  Today's Date: 09/14/2016 Time: SLP Start Time (ACUTE ONLY): 1108 SLP Stop Time (ACUTE ONLY): 1110 SLP Time Calculation (min) (ACUTE ONLY): 2 min  Past Medical History:  Past Medical History:  Diagnosis Date  . Diabetes mellitus   . Hyperlipidemia   . Hypertension    Past Surgical History:  Past Surgical History:  Procedure Laterality Date  . CARDIAC CATHETERIZATION N/A 08/15/2016   Procedure: Left Heart Cath and Coronary Angiography;  Surgeon: Yolonda Kida, MD;  Location: [place] [facility];  Service: Cardiovascular;  Laterality: N/A;  . CARDIAC CATHETERIZATION N/A 08/15/2016   Procedure: Coronary Stent Intervention;  Surgeon: Yolonda Kida, MD;  Location: [place] [facility];  Service: Cardiovascular;  Laterality: N/A;  . CARDIAC CATHETERIZATION N/A 08/18/2016   Procedure: Right Heart Cath;  Surgeon: Jolaine Artist, MD;  Location: [place] [facility];  Service: Cardiovascular;  Laterality: N/A;  . CARDIAC CATHETERIZATION N/A 08/18/2016   Procedure: IABP Insertion;  Surgeon: Jolaine Artist, MD;  Location: [place] [facility];  Service: Cardiovascular;  Laterality: N/A;  . CARDIAC CATHETERIZATION Right 08/23/2016   Procedure: CENTRAL LINE INSERTION RIGHT SUBCLAVIAN;  Surgeon: Ivin Poot, MD;  Location: [place];  Service: Open Heart Surgery;  Laterality: Right;  . ENDOVEIN HARVEST OF GREATER SAPHENOUS VEIN Left 08/23/2016   Procedure: ENDOVEIN HARVEST OF GREATER SAPHENOUS VEIN;  Surgeon: Ivin Poot, MD;  Location: [place];  Service: Open Heart Surgery;  Laterality: Left;  . INSERTION OF DIALYSIS CATHETER Bilateral 08/31/2016   Procedure: INSERTION OF DIALYSIS CATHETER LEFT INTERNAL JUGULAR VEIN & INSERTION OF TRIPLE LUMEN RIGHT INTERNAL JUGULAR VEIN;  Surgeon: Angelia Mould, MD;  Location: [place];  Service: Vascular;  Laterality: Bilateral;  .  INTRAOPERATIVE TRANSESOPHAGEAL ECHOCARDIOGRAM N/A 08/23/2016   Procedure: INTRAOPERATIVE TRANSESOPHAGEAL ECHOCARDIOGRAM;  Surgeon: Ivin Poot, MD;  Location: [place];  Service: Open Heart Surgery;  Laterality: N/A;  . MITRAL VALVE REPAIR N/A 08/23/2016   Procedure: MITRAL VALVE REPAIR (MVR) USING 25MM EDWARDS MAGNA EASE BIOPROSTHESIS MITRAL  VALVE;  Surgeon: Ivin Poot, MD;  Location: [place];  Service: Open Heart Surgery;  Laterality: N/A;  . VAGINAL DELIVERY     x 6   HPI:  60 yo female admitted to [facility] 08/15/16 with STEMI s/p cath to [place]. Developed cardiogenic shock, VDRF (ETT 9/14-9/29), AKI with metabolic acidosis and started on CRRT 08/17/16. 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 She also had fever and found to have Group B Strep in sputum culture.Mitral valve replacement 9/20. Swallow evaluation 9/30 with recs for NPO given poor mentation, severely impaired swallow function s/p prolonged intubation. Reintubated 9/30 secondary to septic shock; trach 10/5; new onset right hemiplegia and right gaze preference.  CT negative for acute changes; dx acute encephalopathy.    Assessment / Plan / Recommendation Clinical Impression  Pt was upright in chair for PO trials. She had aphonic vocal quality and elicited a weak cough. Trials of ice chips were given and no s/s of aspiration occurred. Given pt's reduced ability to wear PMV and risk for aspiration due to prolonged intubation and poor status of vocal quality and cough, recommend giving ice chips following oral care. Will continue to follow for diet advancement as tolerance for PMV and respiratory status continues to improve.    Aspiration Risk  Moderate aspiration risk  Diet Recommendation Ice chips PRN after oral care   Medication Administration: Via alternative means Supervision: Full supervision/cueing for compensatory strategies Postural Changes: Seated upright at 90 degrees     Other  Recommendations Oral Care Recommendations: Oral care prior to ice chip/H20 Other Recommendations: Have oral suction available   Follow up Recommendations Skilled Nursing facility      Frequency and Duration min 2x/week  2 weeks       Prognosis Prognosis for Safe Diet Advancement: Good Barriers to Reach Goals: Cognitive deficits      Swallow Study   General HPI: 60 yo female admitted to [facility] 08/15/16 with STEMI s/p cath to [place]. Developed cardiogenic shock, VDRF (ETT 9/14-9/29), AKI with metabolic acidosis and started on CRRT 08/17/16. 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 She also had fever and found to have Group B Strep in sputum culture.Mitral valve replacement 9/20. Swallow evaluation 9/30 with recs for NPO given poor mentation, severely impaired swallow function s/p prolonged intubation. Reintubated 9/30 secondary to septic shock; trach 10/5; new onset right hemiplegia and right gaze preference.  CT negative for acute changes; dx acute encephalopathy.  Type of Study: Bedside Swallow Evaluation Previous Swallow Assessment: BSE- 09/02/16 Diet Prior to this Study: NPO Temperature Spikes Noted: No Respiratory Status: Trach;Trach Collar Trach Size and Type: #6;Cuff;Deflated;With PMSV not in place History of Recent Intubation: Yes Length of Intubations (days): 5 days Date extubated: 09/07/16 Behavior/Cognition: Alert;Cooperative Oral Cavity Assessment: Within Functional Limits Oral Care Completed by SLP: No Oral Cavity - Dentition: Poor condition (missing front tooth) Self-Feeding Abilities: Total assist Patient Positioning: Upright in chair Baseline Vocal Quality: Aphonic Volitional Cough: Weak    Oral/Motor/Sensory Function Overall Oral Motor/Sensory Function: Within functional limits   Ice Chips Ice chips: Within functional limits Presentation: Spoon   Thin Liquid Thin Liquid: Not tested    Nectar Thick Nectar  Thick Liquid: Not tested   Honey Thick Honey Thick Liquid: Not tested   Puree Puree: Not tested   Solid   GO   Solid: Not tested       Ezekiel Slocumb, Student SLP  Deborah [name] 09/14/2016,11:45 AM

## 2016-09-14 NOTE — Progress Notes (Signed)
Physical Therapy Treatment Patient Details Name: Deborah [name] MRN: [id number] DOB: [birth year] Today's Date: 09/14/2016    History of Present Illness 60 yo admitted to [facility] 9/12 with STEMI develped cardiogenic shock with ETT and cRRT initiated on 9/14 after cath, transfer to [facility] 8/65, metabolic acidosis, underwent MVR 9/20, extubated 9/29, reintubated 9/30, 9/25-9/29 hemiplegia developed and resolved, trach and bronchoscopy 10/5. PMHx: DM, HTN, HLD    PT Comments    Pt pleasant and willing to attempt mobility today but fatigued very quickly with standing trials. She continues to require assist for balance and max cueing to not push with bil UE for positioning and balance. Pt educated for standing balance and trials but knees buckling and scissoring with stepping attempts today. Educated for HEP and will continue to follow.  Able to maintain 95% on RA during session HR 125 max with mobility  Follow Up Recommendations  CIR;Supervision/Assistance - 24 hour     Equipment Recommendations       Recommendations for Other Services       Precautions / Restrictions Precautions Precautions: Sternal;Fall Precaution Comments: trach, flexiseal    Mobility  Bed Mobility               General bed mobility comments: Pt sitting in chair on arrival  Transfers Overall transfer level: Needs assistance Equipment used: 2 person hand held assist Transfers: Sit to/from Stand Sit to Stand: Max assist;Total assist         General transfer comment: Pt stood x 3.  First attempt, pt required max A +2 first attempt at standing.  Pt attempted to advance Rt LE to take a step, but scissored.   Pt returned to sitting.    Pt attempted to stand x 2 more requiring total A +2.  She was unable to achieve full standing due to fatigue   Ambulation/Gait                 Stairs            Wheelchair Mobility    Modified Rankin (Stroke Patients Only)       Balance Overall balance  assessment: Needs assistance Sitting-balance support: Feet supported Sitting balance-Leahy Scale: Poor Sitting balance - Comments: Pt required min - mod A to maintain EOC sitting.  Pt tends to lean either forward or to the left.  She requires mod cues to correct  Postural control: Posterior lean;Left lateral lean Standing balance support: Bilateral upper extremity supported Standing balance-Leahy Scale: Zero Standing balance comment: Pt required max A +2 to maintain standing initially, heavy lean to Lt.  Other attempts at standing resulted in requiring total A.                      Cognition Arousal/Alertness: Awake/alert Behavior During Therapy: Flat affect Overall Cognitive Status: Impaired/Different from baseline Area of Impairment: Attention;Following commands;Safety/judgement;Problem solving   Current Attention Level: Sustained Memory: Decreased short-term memory Following Commands: Follows one step commands consistently;Follows one step commands inconsistently Safety/Judgement: Decreased awareness of safety   Problem Solving: Slow processing;Requires verbal cues;Difficulty sequencing General Comments: pt educated for sternal precautions    Exercises  General Exercises - Lower Extremity Long Arc Quad: AROM;Both;Seated;10 reps Hip ABduction/ADduction: AAROM;Both;10 reps;Seated    General Comments        Pertinent Vitals/Pain Pain Assessment: Faces Faces Pain Scale: Hurts little more Pain Location: chest with coughing, sacrum with adjusting in chair Pain Descriptors / Indicators: Aching;Grimacing;Operative site guarding Pain Intervention(s): Repositioned  Home Living                      Prior Function            PT Goals (current goals can now be found in the care plan section) Progress towards PT goals: Progressing toward goals    Frequency    Min 3X/week      PT Plan Current plan remains appropriate    Co-evaluation PT/OT/SLP  Co-Evaluation/Treatment: Yes Reason for Co-Treatment: Complexity of the patient's impairments (multi-system involvement) PT goals addressed during session: Balance;Strengthening/ROM;Mobility/safety with mobility OT goals addressed during session: Strengthening/ROM;ADL's and self-care     End of Session Equipment Utilized During Treatment: Gait belt Activity Tolerance: Patient tolerated treatment well Patient left: in chair;with call bell/phone within reach;with chair alarm set     Time: 2197-5883 PT Time Calculation (min) (ACUTE ONLY): 23 min  Charges:  $Therapeutic Activity: 8-22 mins                    G Codes:      [name] 2016/09/19, 1:42 PM Elwyn Reach, [place]

## 2016-09-14 NOTE — Progress Notes (Signed)
Occupational Therapy Treatment Patient Details Name: Deborah [name] MRN: [id number] DOB: 1956/08/19 Today's Date: 09/14/2016    History of present illness 60 yo admitted to [facility] 9/12 with STEMI develped cardiogenic shock with ETT and cRRT initiated on 9/14 after cath, transfer to [facility] 4/09, metabolic acidosis, underwent MVR 9/20, extubated 9/29, reintubated 9/30, 9/25-9/29 hemiplegia developed and resolved, trach and bronchoscopy 10/5. PMHx: DM, HTN, HLD   OT comments  Pt making slow, but steady progress toward goals.  She fatigues quickly with activity and continues to require total A for ADLs.   Follow Up Recommendations  CIR;Supervision/Assistance - 24 hour    Equipment Recommendations  3 in 1 bedside comode    Recommendations for Other Services Rehab consult    Precautions / Restrictions Precautions Precautions: Sternal;Fall Precaution Comments: trach, flexiseal       Mobility Bed Mobility               General bed mobility comments: Pt sitting in chair on arrival  Transfers Overall transfer level: Needs assistance Equipment used: 2 person hand held assist Transfers: Sit to/from Stand Sit to Stand: Max assist;Total assist         General transfer comment: Pt stood x 3.  First attempt, pt required max A +2 first attempt at standing.  Pt attempted to advance Rt LE to take a step, but scissored.   Pt returned to sitting.    Pt attempted to stand x 2 more requiring total A +2.  She was unable to achieve full standing due to fatigue     Balance Overall balance assessment: Needs assistance Sitting-balance support: Feet supported Sitting balance-Leahy Scale: Poor Sitting balance - Comments: Pt required min - mod A to maintain EOC sitting.  Pt tends to lean either forward or to the left.  She requires mod cues to correct  Postural control: Posterior lean;Left lateral lean Standing balance support: Bilateral upper extremity supported Standing balance-Leahy Scale:  Zero Standing balance comment: Pt required max A +2 to maintain standing initially, heavy lean to Lt.  Other attempts at standing resulted in requiring total A.                     ADL Overall ADL's : Needs assistance/impaired Eating/Feeding: NPO                       Toilet Transfer: Maximal assistance;+2 for safety/equipment;BSC;RW           Functional mobility during ADLs: Maximal assistance;+2 for physical assistance;Rolling walker        Vision                     Perception     Praxis      Cognition   Behavior During Therapy: Flat affect Overall Cognitive Status: Impaired/Different from baseline Area of Impairment: Attention;Following commands;Safety/judgement;Problem solving   Current Attention Level: Sustained Memory: Decreased short-term memory  Following Commands: Follows one step commands consistently;Follows one step commands inconsistently Safety/Judgement: Decreased awareness of safety   Problem Solving: Slow processing;Requires verbal cues;Difficulty sequencing General Comments: pt educated for sternal precautions    Extremity/Trunk Assessment               Exercises General Exercises - Upper Extremity Shoulder Flexion: AAROM;Right;Left;5 reps;Seated Shoulder ABduction: AAROM;Right;Left;5 reps;Seated General Exercises - Lower Extremity Long Arc Quad: AROM;Both;Seated;10 reps Hip ABduction/ADduction: AAROM;Both;10 reps;Seated   Shoulder Instructions       General Comments  Pertinent Vitals/ Pain       Pain Assessment: Faces Faces Pain Scale: Hurts little more Pain Location: chest with coughing, sacrum with adjusting in chair Pain Descriptors / Indicators: Aching;Grimacing;Operative site guarding Pain Intervention(s): Repositioned  Home Living                                          Prior Functioning/Environment              Frequency  Min 3X/week        Progress Toward  Goals  OT Goals(current goals can now be found in the care plan section)  Progress towards OT goals: Progressing toward goals  Acute Rehab OT Goals OT Goal Formulation: With patient Time For Goal Achievement: 09/27/16 Potential to Achieve Goals: Good  Plan Discharge plan remains appropriate    Co-evaluation    PT/OT/SLP Co-Evaluation/Treatment: Yes Reason for Co-Treatment: Complexity of the patient's impairments (multi-system involvement) PT goals addressed during session: Balance;Strengthening/ROM;Mobility/safety with mobility OT goals addressed during session: Strengthening/ROM;ADL's and self-care      End of Session Equipment Utilized During Treatment: Rolling walker;Gait belt   Activity Tolerance Patient tolerated treatment well   Patient Left in chair;with call bell/phone within reach;with family/visitor present   Nurse Communication Mobility status        Time: 2902-1115 OT Time Calculation (min): 24 min  Charges: OT General Charges $OT Visit: 1 Procedure OT Treatments $Therapeutic Activity: 8-22 mins  Tanvir Hipple M 09/14/2016, 1:41 PM

## 2016-09-14 NOTE — Progress Notes (Signed)
14 Days Post-Op Procedure(s) (LRB): INSERTION OF DIALYSIS CATHETER LEFT INTERNAL JUGULAR VEIN & INSERTION OF TRIPLE LUMEN RIGHT INTERNAL JUGULAR VEIN (Bilateral) Subjective: Had HD last nite with 2L off- wt still up anxious overnite CO-ox 52%, nsr Coumadin started for MVR, a-fib Objective: Vital signs in last 24 hours: Temp:  [97.4 F (36.3 C)-99.1 F (37.3 C)] 98.4 F (36.9 C) (10/12 0402) Pulse Rate:  [95-128] 120 (10/12 0700) Cardiac Rhythm: Sinus tachycardia (10/12 0700) Resp:  [18-33] 20 (10/12 0700) BP: (85-141)/(46-79) 141/46 (10/12 0700) SpO2:  [28 %-100 %] 97 % (10/12 0700) FiO2 (%):  [28 %] 28 % (10/12 0700) Weight:  [139 lb 1.8 oz (63.1 kg)-143 lb 8.3 oz (65.1 kg)] 143 lb 4.8 oz (65 kg) (10/12 0443)  Hemodynamic parameters for last 24 hours:  afeb  Intake/Output from previous day: 10/11 0701 - 10/12 0700 In: 1645.3 [I.V.:595.3; NG/GT:850; IV Piggyback:200] Out: 2000  Intake/Output this shift: No intake/output data recorded.       Exam    General- alert and comfortable   Lungs- clear without rales, wheezes   Cor- regular rate and rhythm, no murmur , gallop   Abdomen- soft, non-tender   Extremities - warm, non-tender, minimal edema   Neuro- oriented, appropriate, no focal weakness   Lab Results:  Recent Labs  09/13/16 0422 09/14/16 0350  WBC 18.4* 20.5*  HGB 8.5* 9.0*  HCT 26.8* 28.3*  PLT 324 361   BMET:  Recent Labs  09/13/16 0422 09/14/16 0350  NA 135 136  K 3.4* 3.9  CL 101 101  CO2 23 26  GLUCOSE 175* 189*  BUN 93* 29*  CREATININE 3.68* 2.07*  CALCIUM 8.6* 8.3*    PT/INR:  Recent Labs  09/14/16 0350  LABPROT 17.5*  INR 1.43   ABG    Component Value Date/Time   PHART 7.541 (H) 09/08/2016 0412   HCO3 25.2 09/08/2016 0412   TCO2 26 09/08/2016 0412   ACIDBASEDEF 4.1 (H) 09/02/2016 1850   O2SAT 54.7 09/13/2016 0420   CBG (last 3)   Recent Labs  09/13/16 1917 09/13/16 2351 09/14/16 0350  GLUCAP 102* 167* 148*     Assessment/Plan: S/P Procedure(s) (LRB): INSERTION OF DIALYSIS CATHETER LEFT INTERNAL JUGULAR VEIN & INSERTION OF TRIPLE LUMEN RIGHT INTERNAL JUGULAR VEIN (Bilateral) Mobilize Diabetes control WBC 20 k  check tracheal aspirate Coumadin per pharm goal INR 2-2.5  LOS: 27 days    Deborah [name] 09/14/2016

## 2016-09-14 NOTE — Progress Notes (Addendum)
ANTICOAGULATION CONSULT NOTE - Follow Up Consult  Pharmacy Consult for Heparin and Coumadin Indication: Aflutter  No Known Allergies  Patient Measurements: Height: 5\' 1"  (154.9 cm) Weight: 143 lb 4.8 oz (65 kg) IBW/kg (Calculated) : 47.8 Heparin Dosing Weight: 61kg  Vital Signs: Temp: 98.2 F (36.8 C) (10/12 0822) Temp Source: Oral (10/12 0822) BP: 158/144 (10/12 0825) Pulse Rate: 116 (10/12 0825)  Labs:  Recent Labs  09/12/16 0301  09/13/16 0422 09/13/16 0640 09/13/16 1500 09/14/16 0350 09/14/16 0715  HGB 8.8*  --  8.5*  --   --  9.0*  --   HCT 29.4*  --  26.8*  --   --  28.3*  --   PLT 266  --  324  --   --  361  --   LABPROT  --   --   --   --   --  17.5*  --   INR  --   --   --   --   --  1.43  --   HEPARINUNFRC  --   < >  --  0.29* 0.11*  --  0.28*  CREATININE 2.34*  --  3.68*  --   --  2.07*  --   < > = values in this interval not displayed.  Estimated Creatinine Clearance: 25.3 mL/min (by C-G formula based on SCr of 2.07 mg/dL (H)).   Medications:  Heparin @ 1000 units/hr  Assessment: 32 yof continues on heparin for new aflutter with orders 10/11 to bridge to Coumadin.  Heparin level is at goal (0.28) today after slight rate increase last night. INR 1.43. LFTs wnl. On po amiodarone which was started 10/8.   Goal of Therapy:  Heparin level 0.2-0.3 units/ml  INR 2-2.5 Monitor platelets by anticoagulation protocol: Yes   Plan:  1) Continue heparin at 1000 units/hr 2) Coumadin 5mg  x 1 3) 8 hour heparin level to confirm 4) Daily INR/heparin level/CBC  Melburn Popper 09/14/2016,9:29 AM

## 2016-09-14 NOTE — Progress Notes (Addendum)
Pharmacy Antibiotic Note  Deborah [name] is a 60 y.o. female who continues on Imipenem for enterobacter PNA with plans to continue for 14 days (stop date 10/18). Afebrile, WBC remain elevated. First iHD was 10/11 and plans are to continue with HD tomorrow.  Plan: Imipenem 250mg  q12h Follow renal function and plans for HD   Height: 5\' 1"  (154.9 cm) Weight: 143 lb 4.8 oz (65 kg) IBW/kg (Calculated) : 47.8  Temp (24hrs), Avg:98 F (36.7 C), Min:97.4 F (36.3 C), Max:99.1 F (37.3 C)   Recent Labs Lab 09/10/16 0457 09/10/16 0736  09/11/16 0416 09/11/16 1953 09/12/16 0301 09/13/16 0422 09/14/16 0350  WBC 19.3*  --   --  18.3*  --  18.7* 18.4* 20.5*  CREATININE 1.23*  --   < > 1.19* 1.98* 2.34* 3.68* 2.07*  LATICACIDVEN  --  0.89  --   --   --   --   --   --   < > = values in this interval not displayed.  Estimated Creatinine Clearance: 25.3 mL/min (by C-G formula based on SCr of 2.07 mg/dL (H)).    No Known Allergies  Antimicrobials this admission: 9/16 Vanc >> 9/18, 9/20>>9/22, resume 9/25>>9/29  9/30>>10/7 9/16 Zosyn >> 9/18, resume 9/25>>9/29  9/30>>10/4 10/4 Imipenem >> (10/18) 9/25 Fluconazole 9/27 restart 10/2>10/5 CTX 9/18>> 9/24  Dose adjustments this admission: 9/17 Vanc random >>16 (cont 1 gm q48h) 9/28 VT (on CVVHD) = 21, cont 1g q24 10/2 VT 18 on CVVHD on 750 q24  Microbiology results: 9/15 BCx: neg F 9/15 Sputum: mod group b strep/MSSA 9/15 MRSA PCR: negative 9/24 BCx2: 1/2 BCID CoNS likely contaminant 9/29 cdiff neg 9/30 BC x 2: neg F 10/1 TA -enterobacter S imip, cipro, R zosyn   Melburn Popper, PharmD Clinical Pharmacy Resident Pager: [phone] 09/14/16 9:43 AM

## 2016-09-14 NOTE — Progress Notes (Signed)
Patient ID: Deborah [name], female   DOB: 1956-10-03, 60 y.o.   MRN: [id number]  SICU Evening Rounds:  Hemodynamically stable on milrinone 0.25  Up in chair on trach collar with sats 96%  Tolerating TF at goal  Plan HD tomorrow.

## 2016-09-14 NOTE — Progress Notes (Signed)
S:  Nods no to "Any Complaints"  Sitting up in chair O:BP (!) 141/46   Pulse (!) 120   Temp 98.4 F (36.9 C) (Oral)   Resp 20   Ht _0  (1.549 m)   Wt 65 kg (143 lb 4.8 oz)   SpO2 97%   BMI 27.08 kg/m   Intake/Output Summary (Last 24 hours) at 09/14/16 0724 Last data filed at 09/14/16 0700  Gross per 24 hour  Intake          1645.28 ml  Output             2000 ml  Net          -354.72 ml   Weight change: 0 kg (0 lb) GMW:NUUVO and alert.  Trached. CVS:RRR 2/6 systolic M Resp: Decreased BS bases Abd: +BS NTND Ext: no edema NEURO: Follows commands Rt IJ temp cath Lt IJ HD cath   . amiodarone  200 mg Oral BID  . atorvastatin  20 mg Oral q1800  . chlorhexidine gluconate (MEDLINE KIT)  15 mL Mouth Rinse BID  . clopidogrel  75 mg Oral Daily  . darbepoetin (ARANESP) injection - NON-DIALYSIS  100 mcg Subcutaneous Q Mon-1800  . famotidine  20 mg Oral Daily  . feeding supplement (NEPRO CARB STEADY)  1,000 mL Per Tube Q24H  . feeding supplement (PRO-STAT SUGAR FREE 64)  60 mL Per Tube TID  . Gerhardt's butt cream   Topical BID  . imipenem-cilastatin  250 mg Intravenous Q12H  . insulin aspart  0-24 Units Subcutaneous Q4H  . insulin detemir  10 Units Subcutaneous BID  . mouth rinse  15 mL Mouth Rinse 10 times per day  . midodrine  10 mg Oral TID WC  . multivitamin  15 mL Per Tube Daily  . Warfarin - Pharmacist Dosing Inpatient   Does not apply q1800   Dg Chest Port 1 View  Result Date: 09/13/2016 CLINICAL DATA:  Shortness of breath in a patient with tracheostomy. EXAM: PORTABLE CHEST 1 VIEW COMPARISON:  09/12/2016. FINDINGS: 0608 hours. The cardio pericardial silhouette is enlarged. Tracheostomy tube remains in place. A feeding tube passes into the stomach although the distal tip position is not included on the film. Left IJ central line tip overlies the SVC/ RA junction. Right subclavian central line tip is positioned over the upper right atrium. Patient is status post median  sternotomy and cardiac valve replacement. Right IJ catheter seen previously has been removed in the interval. Bibasilar collapse/consolidation persists. There is pulmonary vascular congestion without overt pulmonary edema. Question small right pleural effusion. Telemetry leads overlie the chest. IMPRESSION: Right IJ catheter seen previously has been removed in the interval. Cardiomegaly with persistent bibasilar collapse/consolidation and probable small right pleural effusion. Electronically Signed   By: Misty Stanley M.D.   On: 09/13/2016 09:15   Dg Chest Port 1 View  Result Date: 09/12/2016 CLINICAL DATA:  Central line placement EXAM: PORTABLE CHEST 1 VIEW COMPARISON:  09/12/2016 FINDINGS: Tracheostomy remains in good position. Left jugular dual-lumen catheter remains in the SVC. Right jugular dual-lumen catheter in the SVC. New right subclavian central venous catheter in the right atrium. No pneumothorax. Bilateral airspace disease compatible with edema and small bilateral effusions unchanged. Bibasilar atelectasis unchanged. Feeding tube enters the stomach with the tip not visualized IMPRESSION: Right subclavian catheter has been placed since the prior study with the tip in the right atrium. No pneumothorax Pulmonary edema with bilateral effusions and bibasilar atelectasis unchanged. Electronically Signed  By: Franchot Gallo M.D.   On: 09/12/2016 19:49   BMET    Component Value Date/Time   NA 136 09/14/2016 0350   K 3.9 09/14/2016 0350   CL 101 09/14/2016 0350   CO2 26 09/14/2016 0350   GLUCOSE 189 (H) 09/14/2016 0350   BUN 29 (H) 09/14/2016 0350   CREATININE 2.07 (H) 09/14/2016 0350   CREATININE 1.41 (H) 10/04/2012 1631   CALCIUM 8.3 (L) 09/14/2016 0350   GFRNONAA 25 (L) 09/14/2016 0350   GFRAA 29 (L) 09/14/2016 0350   CBC    Component Value Date/Time   WBC 20.5 (H) 09/14/2016 0350   RBC 3.02 (L) 09/14/2016 0350   HGB 9.0 (L) 09/14/2016 0350   HCT 28.3 (L) 09/14/2016 0350   PLT 361  09/14/2016 0350   MCV 93.7 09/14/2016 0350   MCH 29.8 09/14/2016 0350   MCHC 31.8 09/14/2016 0350   RDW 18.9 (H) 09/14/2016 0350   LYMPHSABS 2.3 08/23/2016 0328   MONOABS 4.1 (H) 08/23/2016 0328   EOSABS 0.5 08/23/2016 0328   BASOSABS 0.0 08/23/2016 0328     Assessment:  1. Acute on CKD 3 in setting of cath/MVR, CABG X 1, Still no UO 2. SP MVR 3. MRSA bacteremia 4. DM 5. SP STEMI 6. Anemia on aranesp  Plan: 1. Will plan HD tomorrow   Deborah [name] T

## 2016-09-14 NOTE — Progress Notes (Signed)
Patient ID: Deborah [name], female   DOB: 08/09/1956, 60 y.o.   MRN: [id number]   ADVANCED HF TEAM  60 y/o woman with DM2, HTN, HL and noncompliance presented to [facility] ER with infrerior posterior STEMI on 9/12. Taken to cath lab and found to have occluded small to moderate OM-1 branch. Underwent successful PCS with stent x 2 by Dr. Callwood. Coronaries otherwise ok. EF 55-60% with severe MR confirmed by echo.   Post-cath developed respiratory failure and required intubation. Developed shock and renal failure with peak creatinine 3.4 (1.6 on admit). Transferred to [facility] for furthe management on 9/15.. TEE showed normal LV (EF 60-65%) and normal RV function with ruptured posterior papillary muscle with severe posterior MR.  Underwent placement of Swan and IABP. S/P MVR 08/23/16.   Treated for group B Strep PNA.   Echo 9/23  RV and LV are normal EF 60%  Extubated 9/29.  However, picture consistent with septic shock on 9/30 so pressors started and she was re-intubated. S/P Trach 10/5.  Developed AFL S/P TEE DC-CV 09/04/16   Milrinone stopped 09/10/16, Coox fell to 30% -> 47.4% -> 60.1 with resumption.  Coox 52% on milrinone 0.25 mcg/kg/min. WBC 20.5.   Started on coumadin 09/13/16. INR 1.43 this am. Goal 2-2.5 per Dr. Van Trigt.  Awake and alert. Tolerated HD yesterday. Remains very weak.   Scheduled Meds: . amiodarone  200 mg Oral BID  . atorvastatin  20 mg Oral q1800  . chlorhexidine gluconate (MEDLINE KIT)  15 mL Mouth Rinse BID  . clopidogrel  75 mg Oral Daily  . darbepoetin (ARANESP) injection - NON-DIALYSIS  100 mcg Subcutaneous Q Mon-1800  . famotidine  20 mg Oral Daily  . feeding supplement (NEPRO CARB STEADY)  1,000 mL Per Tube Q24H  . feeding supplement (PRO-STAT SUGAR FREE 64)  60 mL Per Tube TID  . Gerhardt's butt cream   Topical BID  . imipenem-cilastatin  250 mg Intravenous Q12H  . insulin aspart  0-24 Units Subcutaneous Q4H  . insulin detemir  10 Units Subcutaneous BID  .  mouth rinse  15 mL Mouth Rinse 10 times per day  . midodrine  10 mg Oral TID WC  . multivitamin  15 mL Per Tube Daily  . QUEtiapine  50 mg Oral QHS  . warfarin  5 mg Oral ONCE-1800  . Warfarin - Pharmacist Dosing Inpatient   Does not apply q1800   Continuous Infusions: . sodium chloride    . sodium chloride 10 mL/hr at 09/11/16 0800  . heparin 1,000 Units/hr (09/14/16 0700)  . milrinone 0.25 mcg/kg/min (09/14/16 0700)   PRN Meds:.Place/Maintain arterial line **AND** sodium chloride, fentaNYL (SUBLIMAZE) injection, HYDROcodone-acetaminophen, levalbuterol, ondansetron (ZOFRAN) IV, sodium chloride flush    Vitals:   09/14/16 0600 09/14/16 0700 09/14/16 0822 09/14/16 0825  BP: 100/62 (!) 141/46  (!) 158/144  Pulse: (!) 115 (!) 120  (!) 116  Resp: (!) 22 20  (!) 21  Temp:   98.2 F (36.8 C)   TempSrc:   Oral   SpO2: 93% 97%  100%  Weight:      Height:        Intake/Output Summary (Last 24 hours) at 09/14/16 0947 Last data filed at 09/14/16 0700  Gross per 24 hour  Intake          1537.68 ml  Output             2000 ml  Net          -  462.32 ml    LABS: Basic Metabolic Panel:  Recent Labs  09/12/16 0301 09/13/16 0422 09/14/16 0350  NA 133* 135 136  K 3.6 3.4* 3.9  CL 103 101 101  CO2 18* 23 26  GLUCOSE 112* 175* 189*  BUN 51* 93* 29*  CREATININE 2.34* 3.68* 2.07*  CALCIUM 8.5* 8.6* 8.3*  MG 2.4  --   --   PHOS 3.3 4.0 2.0*   Liver Function Tests:  Recent Labs  09/12/16 0301 09/13/16 0422 09/14/16 0350  AST 37 21  --   ALT 47 27  --   ALKPHOS 121 111  --   BILITOT 1.0 1.3*  --   PROT 6.0* 6.4*  --   ALBUMIN 2.5* 2.4* 2.5*   No results for input(s): LIPASE, AMYLASE in the last 72 hours. CBC:  Recent Labs  09/13/16 0422 09/14/16 0350  WBC 18.4* 20.5*  HGB 8.5* 9.0*  HCT 26.8* 28.3*  MCV 94.4 93.7  PLT 324 361   Cardiac Enzymes: No results for input(s): CKTOTAL, CKMB, CKMBINDEX, TROPONINI in the last 72 hours. BNP: Invalid input(s):  POCBNP D-Dimer: No results for input(s): DDIMER in the last 72 hours. Hemoglobin A1C: No results for input(s): HGBA1C in the last 72 hours. Fasting Lipid Panel: No results for input(s): CHOL, HDL, LDLCALC, TRIG, CHOLHDL, LDLDIRECT in the last 72 hours. Thyroid Function Tests: No results for input(s): TSH, T4TOTAL, T3FREE, THYROIDAB in the last 72 hours.  Invalid input(s): FREET3 Anemia Panel: No results for input(s): VITAMINB12, FOLATE, FERRITIN, TIBC, IRON, RETICCTPCT in the last 72 hours.  PHYSICAL EXAM General: lying in bed, NAD Neck: RIJ TLC LIJ trialysis trach Lungs: Diminished bases. CV: Nondisplaced PMI.  Heart tachy, regular S1/S2, no S3/S4, 1/6 SEM USB.   Abdomen: Soft, NT, ND, no HSM. No bruits or masses. +BS  Neurologic: Sleepy but nonfocal. Hoarse voice   Extremities: No clubbing or cyanosis. R and LLE SCDs.   TELEMETRY: Reviewed, NSR 70-80s   Assessment:   1. Cardiogenic shock: Due to acute severe MR. EF 70% on 9/25 echo.  2. CAD: Inferior posterior STEMI on 9/13 with DES x 2 to OM-1      --Plavix restarted on 9/22 3. 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     --s/p MVR 08/23/2016  4. AKI on CKD stage III - due to ATN likely from cardiogenic shock + contrast with cath. Has required CVVH.  5. Acute respiratory failure: Pulmonary edema.  6. DM2 7. Suspect component of septic shock: Possible PNA, group B Strep in sputum culture.  8. Anemia 9. PVCs   10. Shock liver 11. MRSA bacteremia 12. Atrial fibrillation: Paroxysmal.  On amiodarone.    Plan/Discussion:    Relatively stable. S/p trach now on trach collar.   Coox 52% (per nurse, results not coming through Epic) on milrinone 0.25 mcg/kg/min. Continue midodrine to support BP.   Sinus tach this am 100s. Continue po amio. Loading coumadin. Back on Plavix with recent stent.   WBC up. Continue broad spectrum abx. Tracheal aspirate  sampled.    Creatinine 1.9 -> 2.3 -> 3.6 -> 2.07 with HD.   Tolerated HD yesterday.  Will need to wean milrinone if plans for IHD moving forward.   Michael Andrew Tillery, PA-C 09/14/2016   Advanced Heart Failure Team Pager [phone] (M-F; 7a - 4p)  Please contact [facility] for night-coverage after hours (4p -7a ) and weekends on [email or web address]  Agree with   the above note.  If going on IHD, will need to wean off milrinone.  No changes from our standpoint today.   Loralie Champagne 09/14/2016

## 2016-09-14 NOTE — Care Management Note (Signed)
Case Management Note  Patient Details  Name: Deborah [name] MRN: [id number] Date of Birth: 02-Aug-1956   Subjective/Objective:    Copied from previous CM note from [place] 08/17/16            RNCM met with patient's son [name] [phone] and daughter [name] [phone] to discuss discharge planning. Patient is vented currently and son [name] is struggling with patient's current status. I and patient's RN spoke with him to try to comfort him- he appreciate it. [name] would like to be first line of contact for any medical needs. Patient is from home with [name]. She was independent with daily activity. Her PCP is Ronette Deter. Patient had been going to [facility] but per [name] "they were not meeting her mother's medical needs". [name] has been paying for physician visits for her mother. Patient is potentially going to need outpatient dialysis. She was not taking her medications per [name] due to cost- patient does not have health insurance. [name] wants her mother to have home health services- [facility] through [facility] nursing may be appropriate however it is too soon to make those arrangements. Both children are considering SNF if patient too weak to return home. They have met with financial advisors about Medicare and Medicaid per Teresa/Frank.   Action/Plan: Application to [facility] and Medication Management shared with [name]. RNCM to continue to follow.   Expected Discharge Date:                  Expected Discharge Plan:  [place]  In-House Referral:     Discharge planning Services     Post Acute Care Choice:    Choice offered to:     DME Arranged:    DME Agency:     HH Arranged:    [place] Agency:     Status of Service:  In process, will continue to follow  If discussed at Long Length of Stay Meetings, dates discussed:    Additional Comments: 09/14/2016  Plan:  Cont ATC 24/7. 28% FiO2 Trach collar 28% since 09/11/16,  plan to change to cuffless trach 10/13 and consider downsizing.  SLP will eval for swallow and PMV once trach changed. Should be a candidate for decannulation in near future.  Both CIR and SNF following for discharge disposition   09/12/16 Pt recommended for CIR - CSW consulted placed as backup.  CM requested CIR consult via sticky notes.  09/04/16 Elenor Quinones, RN, BSN [phone] Pt is now extubated on [place], remains on CRRT, continuous Milrinone, Amiodarone, Precedex d/c today at extubation.   Per Development worker, community note ; medicaid application has been submitted  09/14/2016  CM spoke in depth with sister and brother in law - they feel that pt will beneift from post acute care facility due to lack of help in the home.  Per sister; pt stays with adult children but projected required help at home will not be sufficient at discharge.  CM discussed with family post acute discharge options including brief overview or LTACH and SNF in general (choice not given).  CM informed family that discharge determination will be followed closely and will be managed daily depending on progress.  Pt is now on vent, CRRT, pressors.  09/01/16 Elenor Quinones, RN, BSN [phone] Discussed in LOS 08/29/16:  Pt remains appropriate for continued stay.  Pt remains on CRRT and ventilator.  Per attending; If no significant improvement in 24-48hrs, anticipate  pt will need a trache/PEG.  She still has volume issues and her decreased mental status make it difficult to wean/extubate safely.  08/24/16 Elenor Quinones, RN, BSN [phone] Pt now transferred to 2S s/p  MVR.  Postoperative management of balloon pump, vasopressors, and chest tube per CV S and cardiology. Patient remains critically ill on ventilator - may need CRRT.  CM will continue to follow for discharge needs Maryclare Labrador, RN 09/14/2016, 2:17 PM

## 2016-09-14 NOTE — Progress Notes (Signed)
PULMONARY / CRITICAL CARE MEDICINE   Name: Deborah [name] MRN: [id number] DOB: [birth year]    ADMISSION DATE:  08/18/2016 CONSULTATION DATE:  08/18/2016  REFERRING MD:  Bensimhon   CHIEF COMPLAINT:  Ventilator management and critical care services   HISTORY OF PRESENT ILLNESS:   60 yo female admitted to [facility] 08/15/16 with STEMI s/p cath to [place].  Developed cardiogenic shock, VDRF (ETT 9/14), AKI with metabolic acidosis and started on CRRT 08/17/16.  Transferred to [facility] to tx shock and evaluated new severe MR from papillary muscle rupture.  She also had fever and found to have Group B Strep in sputum culture. Mitral valve replacement 9/20.  SUBJECTIVE:  Tolerating ATC without need for vent x several days. Breathing comfortably. Swallowing limited by trach size and cuff. Not moving air around trach with PMV in place.   VITAL SIGNS: BP (!) 158/144   Pulse (!) 116   Temp 98.2 F (36.8 C) (Oral)   Resp (!) 21   Ht 5\' 1"  (1.549 m)   Wt 65 kg (143 lb 4.8 oz)   SpO2 100%   BMI 27.08 kg/m   HEMODYNAMICS:    VENTILATOR SETTINGS: FiO2 (%):  [28 %] 28 %  INTAKE / OUTPUT: I/O last 3 completed shifts: In: 2360.6 [I.V.:850.6; NG/GT:1210; IV Piggyback:300] Out: 2000 [Other:2000]  PHYSICAL EXAMINATION: General: No distress HENT: Trach in place, [place]/AT, PERRL, EOM-I and MMM Neuro: awake, alert, follows commands, nods appropriately, MAE, gen weakness  PULM: Clear, Non labored breathing, CV: RRR, systolic murmur noted GI: BS+, soft, nontender MSK: normal bulk and tone.    LABS:  BMET  Recent Labs Lab 09/12/16 0301 09/13/16 0422 09/14/16 0350  NA 133* 135 136  K 3.6 3.4* 3.9  CL 103 101 101  CO2 18* 23 26  BUN 51* 93* 29*  CREATININE 2.34* 3.68* 2.07*  GLUCOSE 112* 175* 189*    Electrolytes  Recent Labs Lab 09/10/16 0457  09/11/16 0416  09/12/16 0301 09/13/16 0422 09/14/16 0350  CALCIUM 8.6*  < > 8.6*  < > 8.5* 8.6* 8.3*  MG 2.5*  --  2.5*  --  2.4  --   --   PHOS  2.4*  < > 1.7*  < > 3.3 4.0 2.0*  < > = values in this interval not displayed.  CBC  Recent Labs Lab 09/12/16 0301 09/13/16 0422 09/14/16 0350  WBC 18.7* 18.4* 20.5*  HGB 8.8* 8.5* 9.0*  HCT 29.4* 26.8* 28.3*  PLT 266 324 361   Coag's  Recent Labs Lab 09/10/16 0457 09/11/16 0416 09/14/16 0350  INR 1.40 1.40 1.43   Sepsis Markers  Recent Labs Lab 09/10/16 0736  LATICACIDVEN 0.89   ABG  Recent Labs Lab 09/08/16 0412  PHART 7.541*  PCO2ART 29.4*  PO2ART 120.0*   Liver Enzymes  Recent Labs Lab 09/11/16 0416  09/12/16 0301 09/13/16 0422 09/14/16 0350  AST 33  --  37 21  --   ALT 68*  --  47 27  --   ALKPHOS 137*  --  121 111  --   BILITOT 0.8  --  1.0 1.3*  --   ALBUMIN 2.7*  < > 2.5* 2.4* 2.5*  < > = values in this interval not displayed.  Cardiac Enzymes No results for input(s): TROPONINI, PROBNP in the last 168 hours.  Glucose  Recent Labs Lab 09/13/16 1144 09/13/16 1631 09/13/16 1917 09/13/16 2351 09/14/16 0350 09/14/16 0819  GLUCAP 156* 130* 102* 167* 148* 226*  Imaging Dg Chest Port 1 View  Result Date: 09/14/2016 CLINICAL DATA:  Status post tracheostomy and mitral valve replacement EXAM: PORTABLE CHEST 1 VIEW COMPARISON:  09/13/2016 FINDINGS: Cardiac shadow remains enlarged. Postsurgical changes are again seen. Right subclavian central line and left jugular dialysis catheter are again noted and stable. A feeding catheter and tracheostomy tube are seen in place. Increased vascular congestion is noted as well as bilateral posterior effusions. Left basilar consolidation remains. IMPRESSION: Persistent bilateral pleural effusions and vascular congestion. Left retrocardiac consolidation remains. Electronically Signed   By: Inez Catalina M.D.   On: 09/14/2016 08:39   STUDIES:  LHC 9/12: PCI to OM1, severe MR, EF 55 to 60% RHC 9/15: RA 5, RV 37/3/6, PA 38/18/26, PCWP 15, CI 1.9, PVR 3.3 WU TEE 9/15: EF 60 to 65%, flail motion MV with severe  MR RUQ ultrasound 9/23 > Gallbladder wall thickening noted, suggestion of sludge, small R effusion, liver unremarkable but left lobe not well visulalized  MICROBIOLOGY: MRSA PCR 9/12:  Negative MRSA PCR 9/15:  Negative Tracheal Asp Ctx 9/15:  Strep agalactiae and enterobacter. Blood Ctx x2 9/15:  Negative  MRSA PCR 9/19:  Negative  Blood culture 9/24: coag neg staph 2/4  ANTIBIOTICS: Zosyn 9/16 - 9/17 Vancomycin 9/16 - 9/17 Rocephin 9/17 >> 9/24 Vancomycin 9/20 (surgical prophylaxis) Vanc 9/25 >off Zosyn 9/25 > off  Fluconazole 9/25 > 10/5 Imipenem 10/4>>>  SIGNIFICANT EVENTS: 9/12 Admit to [facility], PCI to [place] 9/14 ETT, CRRT 9/15 to [facility] 9/16 off insulin gtt 9/20 To OR for Mitral Valve Repair w/ bioprosthetic valve 9/28 To OR for permcath 9/29 Extubated. 9/30 Reintubated 10/5 trach   LINES/TUBES: Right IJ PICC 9/12 - 9/20 Rt femoral introducer 9/15 - 9/20 OETT 8.0 9/14 >> 9/29, 9/30 >>10/5 HD cath left IJ 9/14 >> R IJ Introducer/CVL/Swan 9/20 >>  9/25 IABP 9/15 >>9/22 Lt femoral a line 9/15 >> 9/25 Y Chest Tube/Mediastinal Chest Tube 9/20 >>out L brachial art line 9/25 > 9/28 R IJ CVL > 9/28 L IJ Permcath >  Perc trach (JY) 10/5>>>  ASSESSMENT / PLAN: 60 yo with STEMI, papillary muscle rupture with severe MR, AKI on CRRT, VDRF, Group B strep PNA, cardiogenic/septic shock, DM. S/P MVR w/ Bioprosthetic 9/20. Prolonged hospital course with vent dependence s/p tracheostomy  PULMONARY A: Acute Hypoxic Respiratory Failure - Secondary to acute pulmonary edema, reintubated 9/30 Aspiration risk Trach site bleeding - resolved.  P:   Cont ATC 24/7. 28% FiO2 Has been off vent since 10/9 Will plan to change to cuffless trach 10/13 and consider downsizing SLP will eval for swallow and PMV once trach changed.  Should be a candidate for decannulation in near future.  Georgann Housekeeper, AGACNP-BC [place] [facility] Pager [phone] or [phone]  09/14/2016 11:19 AM  Attending note: I have seen and examined the patient with nurse practitioner/resident and agree with the note. History, labs and imaging reviewed.  Tolerating TM since 10/8.  Can probably downsize tomorrow to allow PMV trials.  Marshell Garfinkel MD [place] [facility] Pager [phone] If no answer or after 3pm call: [phone] 09/14/2016, 11:57 AM

## 2016-09-15 ENCOUNTER — Inpatient Hospital Stay (HOSPITAL_COMMUNITY): Payer: Medicaid Other

## 2016-09-15 DIAGNOSIS — Z430 Encounter for attention to tracheostomy: Secondary | ICD-10-CM

## 2016-09-15 DIAGNOSIS — I4891 Unspecified atrial fibrillation: Secondary | ICD-10-CM

## 2016-09-15 LAB — BASIC METABOLIC PANEL
Anion gap: 11 (ref 5–15)
BUN: 55 mg/dL — ABNORMAL HIGH (ref 6–20)
CO2: 26 mmol/L (ref 22–32)
Calcium: 8.7 mg/dL — ABNORMAL LOW (ref 8.9–10.3)
Chloride: 97 mmol/L — ABNORMAL LOW (ref 101–111)
Creatinine, Ser: 3.37 mg/dL — ABNORMAL HIGH (ref 0.44–1.00)
GFR calc Af Amer: 16 mL/min — ABNORMAL LOW (ref >60)
GFR calc non Af Amer: 14 mL/min — ABNORMAL LOW (ref >60)
Glucose, Bld: 113 mg/dL — ABNORMAL HIGH (ref 65–99)
Potassium: 4.4 mmol/L (ref 3.5–5.1)
Sodium: 134 mmol/L — ABNORMAL LOW (ref 135–145)

## 2016-09-15 LAB — GLUCOSE, CAPILLARY
GLUCOSE-CAPILLARY: 173 mg/dL — AB (ref 65–99)
GLUCOSE-CAPILLARY: 181 mg/dL — AB (ref 65–99)
GLUCOSE-CAPILLARY: 186 mg/dL — AB (ref 65–99)
Glucose-Capillary: 108 mg/dL — ABNORMAL HIGH (ref 65–99)
Glucose-Capillary: 96 mg/dL (ref 65–99)
Glucose-Capillary: 148 mg/dL — ABNORMAL HIGH (ref 65–99)
Glucose-Capillary: 96 mg/dL (ref 65–99)

## 2016-09-15 LAB — PROTIME-INR
INR: 2.02
Prothrombin Time: 23.2 seconds — ABNORMAL HIGH (ref 11.4–15.2)

## 2016-09-15 LAB — CBC
HCT: 26.4 % — ABNORMAL LOW (ref 36.0–46.0)
Hemoglobin: 8.2 g/dL — ABNORMAL LOW (ref 12.0–15.0)
MCH: 29.4 pg (ref 26.0–34.0)
MCHC: 31.1 g/dL (ref 30.0–36.0)
MCV: 94.6 fL (ref 78.0–100.0)
Platelets: 394 10*3/uL (ref 150–400)
RBC: 2.79 MIL/uL — ABNORMAL LOW (ref 3.87–5.11)
RDW: 18.7 % — ABNORMAL HIGH (ref 11.5–15.5)
WBC: 17.3 10*3/uL — ABNORMAL HIGH (ref 4.0–10.5)

## 2016-09-15 LAB — CARBOXYHEMOGLOBIN - COOX: Carboxyhemoglobin: 1.5 % (ref 0.5–1.5)

## 2016-09-15 LAB — COOXEMETRY PANEL
CARBOXYHEMOGLOBIN: 1.5 % (ref 0.5–1.5)
CARBOXYHEMOGLOBIN: 1.4 % (ref 0.5–1.5)
METHEMOGLOBIN: 1.1 % (ref 0.0–1.5)
METHEMOGLOBIN: 1 % (ref 0.0–1.5)
O2 SAT: 56 %
O2 Saturation: 53 %
TOTAL HEMOGLOBIN: 8.4 g/dL — AB (ref 12.0–16.0)
TOTAL HEMOGLOBIN: 9 g/dL — AB (ref 12.0–16.0)

## 2016-09-15 LAB — HEPARIN LEVEL (UNFRACTIONATED): Heparin Unfractionated: 0.35 IU/mL (ref 0.30–0.70)

## 2016-09-15 MED ORDER — DOBUTAMINE IN D5W 4-5 MG/ML-% IV SOLN
2.5000 ug/kg/min | INTRAVENOUS | Status: DC
  Administered 2016-09-15: 2.5 ug/kg/min via INTRAVENOUS
  Filled 2016-09-15 (×3): qty 250

## 2016-09-15 MED ORDER — AMIODARONE IV BOLUS ONLY 150 MG/100ML
150.0000 mg | Freq: Once | INTRAVENOUS | Status: AC
  Administered 2016-09-15: 150 mg via INTRAVENOUS
  Filled 2016-09-15: qty 100

## 2016-09-15 NOTE — Progress Notes (Signed)
OT Cancellation Note  Patient Details Name: Deborah [name] MRN: [id number] DOB: 03-23-1956   Cancelled Treatment:    Reason Eval/Treat Not Completed: Patient at procedure or test/ unavailable - pt getting HD.    [place], OTR/L [phone]   Lucille Passy M 09/15/2016, 2:59 PM

## 2016-09-15 NOTE — Progress Notes (Signed)
Cortrak tube placed in patients right nare to 70 cm. Pt tolerated well. However, this is a replacement tube bc the patient removed the previous feeding tube. Tube added to assessment and xray ordered. Please contact cortrak team with any questions or concerns. Please save tube if it is removed. The tube can be reinserted if needed.

## 2016-09-15 NOTE — Progress Notes (Signed)
Patient's morning Co Ox failed to transfer over within Epic.  RRT called and results were delivered verbally and are as follows:  SO2 - 56 Carboxyhemoglobin - 1.5 Total Hemoglobin - 8.4    Inis Sizer

## 2016-09-15 NOTE — Progress Notes (Signed)
Patient ID: Deborah [name], female   DOB: July 22, 1956, 60 y.o.   MRN: [id number]   ADVANCED HF TEAM  60 y/o woman with DM2, HTN, HL and noncompliance presented to [facility] ER with infrerior posterior STEMI on 9/12. Taken to cath lab and found to have occluded small to moderate OM-1 branch. Underwent successful PCS with stent x 2 by Dr. Clayborn Bigness. Coronaries otherwise ok. EF 55-60% with severe MR confirmed by echo.   Post-cath developed respiratory failure and required intubation. Developed shock and renal failure with peak creatinine 3.4 (1.6 on admit). Transferred to [facility] for furthe management on 9/15.Marland Kitchen TEE showed normal LV (EF 60-65%) and normal RV function with ruptured posterior papillary muscle with severe posterior MR.  Underwent placement of Swan and IABP. S/P MVR 08/23/16.   Treated for group B Strep PNA.   Echo 9/23  RV and LV are normal EF 60%  Extubated 9/29.  However, picture consistent with septic shock on 9/30 so pressors started and she was re-intubated. S/P Trach 10/5.  Developed AFL S/P TEE DC-CV 09/04/16   Milrinone stopped 09/10/16, Coox fell to 30% -> 47.4% -> 60.1 with resumption. This am Coox 56% on milrinone 0.25 mcg/kg/min. WBC 17.3  Started on coumadin 09/13/16. INR 2.02 this am. Now off heparin. Goal 2-2.5 per Dr. Prescott Gum.  Creatinine took large jump again without dialysis. Plan to repeat dialysis today.  Coox 56%.   Feeling OK. Still essentially without a voice. Cortrak replaced. ABD xray pending.  Scheduled Meds: . amiodarone  150 mg Intravenous Once  . amiodarone  200 mg Oral BID  . atorvastatin  20 mg Oral q1800  . chlorhexidine gluconate (MEDLINE KIT)  15 mL Mouth Rinse BID  . clopidogrel  75 mg Oral Daily  . darbepoetin (ARANESP) injection - NON-DIALYSIS  100 mcg Subcutaneous Q Mon-1800  . famotidine  20 mg Oral Daily  . feeding supplement (NEPRO CARB STEADY)  1,000 mL Per Tube Q24H  . feeding supplement (PRO-STAT SUGAR FREE 64)  60 mL Per Tube TID  .  Gerhardt's butt cream   Topical BID  . imipenem-cilastatin  250 mg Intravenous Q12H  . insulin aspart  0-24 Units Subcutaneous Q4H  . insulin detemir  10 Units Subcutaneous BID  . mouth rinse  15 mL Mouth Rinse 10 times per day  . midodrine  10 mg Oral TID WC  . multivitamin  15 mL Per Tube Daily  . QUEtiapine  50 mg Oral QHS  . Warfarin - Pharmacist Dosing Inpatient   Does not apply q1800   Continuous Infusions: . sodium chloride    . sodium chloride 10 mL/hr at 09/15/16 0700  . milrinone 0.25 mcg/kg/min (09/15/16 0700)   PRN Meds:.Place/Maintain arterial line **AND** sodium chloride, fentaNYL (SUBLIMAZE) injection, HYDROcodone-acetaminophen, levalbuterol, ondansetron (ZOFRAN) IV, sodium chloride flush    Vitals:   09/15/16 0600 09/15/16 0605 09/15/16 0700 09/15/16 0814  BP:  127/67 104/65 120/69  Pulse:   (!) 104 (!) 101  Resp: (!) _0 Temp:      TempSrc:      SpO2:   99% 98%  Weight: 146 lb 9.7 oz (66.5 kg)     Height:        Intake/Output Summary (Last 24 hours) at 09/15/16 0908 Last data filed at 09/15/16 0700  Gross per 24 hour  Intake          1281.85 ml  Output  0 ml  Net          1281.85 ml    LABS: Basic Metabolic Panel:  Recent Labs  09/13/16 0422 09/14/16 0350 09/15/16 0727  NA 135 136 134*  K 3.4* 3.9 4.4  CL 101 101 97*  CO2 _0 GLUCOSE 175* 189* 113*  BUN 93* 29* 55*  CREATININE 3.68* 2.07* 3.37*  CALCIUM 8.6* 8.3* 8.7*  PHOS 4.0 2.0*  --    Liver Function Tests:  Recent Labs  09/13/16 0422 09/14/16 0350  AST 21  --   ALT 27  --   ALKPHOS 111  --   BILITOT 1.3*  --   PROT 6.4*  --   ALBUMIN 2.4* 2.5*   No results for input(s): LIPASE, AMYLASE in the last 72 hours. CBC:  Recent Labs  09/14/16 0350 09/15/16 0345  WBC 20.5* 17.3*  HGB 9.0* 8.2*  HCT 28.3* 26.4*  MCV 93.7 94.6  PLT 361 394   Cardiac Enzymes: No results for input(s): CKTOTAL, CKMB, CKMBINDEX, TROPONINI in the last 72  hours. BNP: Invalid input(s): POCBNP D-Dimer: No results for input(s): DDIMER in the last 72 hours. Hemoglobin A1C: No results for input(s): HGBA1C in the last 72 hours. Fasting Lipid Panel: No results for input(s): CHOL, HDL, LDLCALC, TRIG, CHOLHDL, LDLDIRECT in the last 72 hours. Thyroid Function Tests: No results for input(s): TSH, T4TOTAL, T3FREE, THYROIDAB in the last 72 hours.  Invalid input(s): FREET3 Anemia Panel: No results for input(s): VITAMINB12, FOLATE, FERRITIN, TIBC, IRON, RETICCTPCT in the last 72 hours.  PHYSICAL EXAM General: lying in bed, NAD Neck: RIJ TLC LIJ trialysis trach Lungs: Diminished bases. CV: Nondisplaced PMI.  Heart tachy, regular S1/S2, no S3/S4, 1/6 SEM USB.   Abdomen: Soft, NT, ND, no HSM. No bruits or masses. +BS  Neurologic: Sleepy but nonfocal. Hoarse voice   Extremities: No clubbing or cyanosis. R and LLE SCDs.   TELEMETRY: Reviewed, Sinus tach, 100s    Assessment:   1. Cardiogenic shock: Due to acute severe MR. EF 70% on 9/25 echo.  2. CAD: Inferior posterior STEMI on 9/13 with DES x 2 to OM-1      --Plavix restarted on 9/22 3. 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     --s/p MVR 08/23/2016  4. AKI on CKD stage III - due to ATN likely from cardiogenic shock + contrast with cath. Has required CVVH.  5. Acute respiratory failure: Pulmonary edema.  6. DM2 7. Suspect component of septic shock: Possible PNA, group B Strep in sputum culture.  8. Anemia 9. PVCs   10. Shock liver 11. MRSA bacteremia 12. Atrial fibrillation: Paroxysmal.  On amiodarone.    Plan/Discussion:    Stable s/p trach now on trach collar. Awaiting swallow eval. Cortrak replaced (pt pulled previous). Xray pending.  Coox 56% on milrinone 0.25 mcg/kg/min. Continue midodrine to support BP. Will plan switch to dobutamine 2.5 mcg/kg/min for better compatibility with dialysis. May be poor  candidate going forward.  Check coox in am, or if clinically changes with switch.  Remains sinus tach 100s. Continue po amio. INR at goal.  Off heparin. Back on Plavix with recent stent.   WBC 20.5 -> 17.3. Continue broad spectrum abx per primary team. Tracheal aspirate with abundant WBC, both PMN and Mononuclear.    Creatinine 1.9 -> 2.3 -> 3.6 -> 2.07 -> 3.37.  Tolerated HD 09/13/16.    Shirley Friar, PA-C 09/15/2016  Advanced Heart Failure Team Pager [phone] (M-F; [place])  Please contact [facility] for night-coverage after hours (4p -7a ) and weekends on [email or web address]  Patient seen with PA, agree with the above note.  She remains on milrinone with marginal co-ox.  However, last echo showed EF 70% range (9/25) => ?RV problem as RV was poorly visualized.   - Given HD, would be better to use dobutamine => will stop milrinone and transition to dobutamine 2.5.  However, will attempt to titrate this down soon.  Poor candidate for home inotrope. Will repeat echo to make sure EF remains preserved and RV function is ok.  - Ongoing HD per renal.  - ?atrial fibrillation this morning => will get ECG, getting amiodarone bolus.   Loralie Champagne 09/15/2016 11:47 AM

## 2016-09-15 NOTE — Progress Notes (Signed)
ANTICOAGULATION CONSULT NOTE - Follow Up Consult  Pharmacy Consult for Coumadin Indication: Aflutter  No Known Allergies  Patient Measurements: Height: 5\' 1"  (154.9 cm) Weight: 146 lb 9.7 oz (66.5 kg) IBW/kg (Calculated) : 47.8  Vital Signs: Temp: 97.5 F (36.4 C) (10/13 0400) Temp Source: Oral (10/13 0400) BP: 120/69 (10/13 0814) Pulse Rate: 101 (10/13 0814)  Labs:  Recent Labs  09/13/16 0422  09/14/16 0350 09/14/16 0715 09/14/16 1900 09/15/16 0345 09/15/16 0350 09/15/16 0727  HGB 8.5*  --  9.0*  --   --  8.2*  --   --   HCT 26.8*  --  28.3*  --   --  26.4*  --   --   PLT 324  --  361  --   --  394  --   --   LABPROT  --   --  17.5*  --   --  23.2*  --   --   INR  --   --  1.43  --   --  2.02  --   --   HEPARINUNFRC  --   < >  --  0.28* <0.10*  --  0.35  --   CREATININE 3.68*  --  2.07*  --   --   --   --  3.37*  < > = values in this interval not displayed.  Estimated Creatinine Clearance: 15.7 mL/min (by C-G formula based on SCr of 3.37 mg/dL (H)).   Assessment: 42 yof continues on coumadin for new aflutter. INR has jumped from 1.4 to 2 after two doses. Heparin discontinued today with therapeutic INR. CBC stable. No bleeding.  Goal of Therapy:  INR 2-2.5 Monitor platelets by anticoagulation protocol: Yes   Plan:  1) Hold coumadin tonight  2) Daily INR, CBC  Deboraha Sprang 09/15/2016,9:25 AM

## 2016-09-15 NOTE — Progress Notes (Signed)
ANTICOAGULATION CONSULT NOTE - Follow Up Consult  Pharmacy Consult for Heparin  Indication: Atrial Flutter  No Known Allergies  Patient Measurements: Height: 5\' 1"  (154.9 cm) Weight: 143 lb 4.8 oz (65 kg) IBW/kg (Calculated) : 47.8  Vital Signs: Temp: 97.5 F (36.4 C) (10/13 0400) Temp Source: Oral (10/13 0400) BP: 96/57 (10/13 0400) Pulse Rate: 106 (10/13 0400)  Labs:  Recent Labs  09/13/16 0422  09/14/16 0350 09/14/16 0715 09/14/16 1900 09/15/16 0345 09/15/16 0350  HGB 8.5*  --  9.0*  --   --  8.2*  --   HCT 26.8*  --  28.3*  --   --  26.4*  --   PLT 324  --  361  --   --  394  --   LABPROT  --   --  17.5*  --   --  23.2*  --   INR  --   --  1.43  --   --  2.02  --   HEPARINUNFRC  --   < >  --  0.28* <0.10*  --  0.35  CREATININE 3.68*  --  2.07*  --   --   --   --   < > = values in this interval not displayed.  Estimated Creatinine Clearance: 25.3 mL/min (by C-G formula based on SCr of 2.07 mg/dL (H)).  Assessment: Heparin/warfarin for aflutter, INR just above 2 today, can consider DC heparin today or wait another 24 hours, heparin level is slightly above the lower goal set for patient.   Goal of Therapy:  Heparin level 0.2-0.3 units/ml Monitor platelets by anticoagulation protocol: Yes   Plan:  -Dec heparin to 1100 units/hr -1200 HL  Narda Bonds 09/15/2016,5:22 AM

## 2016-09-15 NOTE — Progress Notes (Signed)
Speech Language Pathology Treatment: Dysphagia;Passy Muir Speaking valve  Patient Details Name: Deborah [name] MRN: [id number] DOB: 1956/06/23 Today's Date: 09/15/2016 Time: 1660-6004 SLP Time Calculation (min) (ACUTE ONLY): 17 min  Assessment / Plan / Recommendation Clinical Impression  Pt seen s/p trach change to assess tolerance of PMV and readiness for POs. Upon SLP arrival she is resting while she undergoes HD and is fairly lethargic but still cooperative. Upon placement of PMV there is evidence of improved access to her upper airway, as she is able to achieve soft, hoarse phonation. Audible exhalations are noted, with more effortful exhalations observed after ~5 minutes of use. VS do remain stable while valve is in place. SLP provided minimal therapeutic trials of ice chips and water until pt declined further offerings. Delayed, weak coughing noted. Overall, pt remains generally deconditioned with increased fatigue this afternoon. Will f/u for additional trials, but would continue with PMV with SLP at this time.   HPI HPI: 60 yo female admitted to [facility] 08/15/16 with STEMI s/p cath to [place]. Developed cardiogenic shock, VDRF (ETT 9/14-9/29), AKI with metabolic acidosis and started on CRRT 08/17/16. Transferred to [facility] to tx shock and evaluated new severe MR from papillary muscle rupture. She also had fever and found to have Group B Strep in sputum culture.Mitral valve replacement 9/20. Swallow evaluation 9/30 with recs for NPO given poor mentation, severely impaired swallow function s/p prolonged intubation. Reintubated 9/30 secondary to septic shock; trach 10/5; new onset right hemiplegia and right gaze preference.  CT negative for acute changes; dx acute encephalopathy.       SLP Plan  Continue with current plan of care     Recommendations  Diet recommendations: NPO Medication Administration: Via alternative means      Patient may use Passy-Muir Speech Valve: with SLP only PMSV  Supervision: Full         Oral Care Recommendations: Oral care prior to ice chip/H20 Follow up Recommendations: Skilled Nursing facility Plan: Continue with current plan of care       GO                Germain Osgood 09/15/2016, 4:19 PM  Germain Osgood, M.A. CCC-SLP [phone]

## 2016-09-15 NOTE — Procedures (Signed)
Tracheostomy tube change: Informed verbal consent was obtained after explaining the risks (including bleeding and infection), benefits and alternatives of the procedure. Verbal timeout was performed prior to the procedure. The old  #6  Cuffed  trach was carefully removed. the tracheostomy site appeared: erythremic at suture site. A new #  4 Cuffless trach was easily placed in the tracheostomy stoma and secured with velcro trach ties. The tracheostomy was patent, good color change observed via EZ-CAP, and the patient was easily able to voice with finger occlusion and tolerated the procedure well with no immediate complications.    Erick Colace ACNP-BC [place] Pager # [phone] OR # [phone] if no answer

## 2016-09-15 NOTE — Progress Notes (Addendum)
15 Days Post-Op Procedure(s) (LRB): INSERTION OF DIALYSIS CATHETER LEFT INTERNAL JUGULAR VEIN & INSERTION OF TRIPLE LUMEN RIGHT INTERNAL JUGULAR VEIN (Bilateral) Subjective: sinus tach inr at goal 2.0, stop heparin Cont with PT Swallow eval- cont cortrack Abundant WBCs on tracheal aspirate Objective: Vital signs in last 24 hours: Temp:  [97.5 F (36.4 C)-98.3 F (36.8 C)] 97.5 F (36.4 C) (10/13 0400) Pulse Rate:  [99-124] 104 (10/13 0700) Cardiac Rhythm: Sinus tachycardia (10/13 0400) Resp:  [17-28] 17 (10/13 0700) BP: (96-158)/(56-144) 104/65 (10/13 0700) SpO2:  [93 %-100 %] 99 % (10/13 0700) FiO2 (%):  [28 %] 28 % (10/13 0400) Weight:  [146 lb 9.7 oz (66.5 kg)] 146 lb 9.7 oz (66.5 kg) (10/13 0600)  Hemodynamic parameters for last 24 hours:    Intake/Output from previous day: 10/12 0701 - 10/13 0700 In: 1391.5 [I.V.:561.5; NG/GT:630; IV Piggyback:200] Out: 0  Intake/Output this shift: No intake/output data recorded.       Exam    General- alert and comfortable   Lungs- clear without rales, wheezes   Cor- regular rate and rhythm, no murmur , gallop   Abdomen- soft, non-tender   Extremities - warm, non-tender, minimal edema   Neuro- oriented, appropriate, no focal weakness   Lab Results:  Recent Labs  09/14/16 0350 09/15/16 0345  WBC 20.5* 17.3*  HGB 9.0* 8.2*  HCT 28.3* 26.4*  PLT 361 394   BMET:  Recent Labs  09/14/16 0350 09/15/16 0727  NA 136 134*  K 3.9 4.4  CL 101 97*  CO2 26 26  GLUCOSE 189* 113*  BUN 29* 55*  CREATININE 2.07* 3.37*  CALCIUM 8.3* 8.7*    PT/INR:  Recent Labs  09/15/16 0345  LABPROT 23.2*  INR 2.02   ABG    Component Value Date/Time   PHART 7.541 (H) 09/08/2016 0412   HCO3 25.2 09/08/2016 0412   TCO2 26 09/08/2016 0412   ACIDBASEDEF 4.1 (H) 09/02/2016 1850   O2SAT 56.0 09/15/2016 0335   CBG (last 3)   Recent Labs  09/14/16 1923 09/15/16 0006 09/15/16 0349  GLUCAP 182* 173* 108*     Assessment/Plan: S/P Procedure(s) (LRB): INSERTION OF DIALYSIS CATHETER LEFT INTERNAL JUGULAR VEIN & INSERTION OF TRIPLE LUMEN RIGHT INTERNAL JUGULAR VEIN (Bilateral) 14 days Imepenum Stop heparin, cont plavix, coumadin Swallow eval  LOS: 28 days    Deborah [name] 09/15/2016

## 2016-09-15 NOTE — Procedures (Signed)
Pt seen on HD.  Just getting started with HD.  Ap 200 Vp 150  BFR 400.  Will try for 3L if BP tolerates

## 2016-09-15 NOTE — Progress Notes (Signed)
S:    Sitting up in chair.  No new CO O:BP 120/69   Pulse (!) 101   Temp 97.5 F (36.4 C) (Oral)   Resp 17   Ht _0  (1.549 m)   Wt 66.5 kg (146 lb 9.7 oz)   SpO2 98%   BMI 27.70 kg/m   Intake/Output Summary (Last 24 hours) at 09/15/16 0859 Last data filed at 09/15/16 0700  Gross per 24 hour  Intake          1336.65 ml  Output                0 ml  Net          1336.65 ml   Weight change: 1.4 kg (3 lb 1.4 oz) FXJ:OITGP and alert.  Trached. CVS: irreg,irreg, sl tachy 2/6 systolic M Resp: Decreased BS bases Abd: +BS NTND Ext: 0-tr edema NEURO: Follows commands Rt IJ temp cath Lt IJ HD cath   . amiodarone  150 mg Intravenous Once  . amiodarone  200 mg Oral BID  . atorvastatin  20 mg Oral q1800  . chlorhexidine gluconate (MEDLINE KIT)  15 mL Mouth Rinse BID  . clopidogrel  75 mg Oral Daily  . darbepoetin (ARANESP) injection - NON-DIALYSIS  100 mcg Subcutaneous Q Mon-1800  . famotidine  20 mg Oral Daily  . feeding supplement (NEPRO CARB STEADY)  1,000 mL Per Tube Q24H  . feeding supplement (PRO-STAT SUGAR FREE 64)  60 mL Per Tube TID  . Gerhardt's butt cream   Topical BID  . imipenem-cilastatin  250 mg Intravenous Q12H  . insulin aspart  0-24 Units Subcutaneous Q4H  . insulin detemir  10 Units Subcutaneous BID  . mouth rinse  15 mL Mouth Rinse 10 times per day  . midodrine  10 mg Oral TID WC  . multivitamin  15 mL Per Tube Daily  . QUEtiapine  50 mg Oral QHS  . Warfarin - Pharmacist Dosing Inpatient   Does not apply q1800   Dg Chest Port 1 View  Result Date: 09/15/2016 CLINICAL DATA:  Shortness breath. Congestive heart failure. Postop from mitral valve replacement. Acute kidney injury. EXAM: PORTABLE CHEST 1 VIEW COMPARISON:  09/14/2016 FINDINGS: The feeding tube is been removed. Other support lines and tubes remain in appropriate position. Decreased bilateral airspace disease is seen, consistent decreased pulmonary edema. There is persistent bibasilar atelectasis and  probable small bilateral pleural effusions. Cardiomegaly stable. Previous mitral valve replacement noted. No pneumothorax identified. IMPRESSION: Decreased bilateral airspace disease, consistent with improving pulmonary edema. Persistent bibasilar atelectasis and probable small bilateral pleural effusions. Electronically Signed   By: Earle Gell M.D.   On: 09/15/2016 08:13   Dg Chest Port 1 View  Result Date: 09/14/2016 CLINICAL DATA:  Status post tracheostomy and mitral valve replacement EXAM: PORTABLE CHEST 1 VIEW COMPARISON:  09/13/2016 FINDINGS: Cardiac shadow remains enlarged. Postsurgical changes are again seen. Right subclavian central line and left jugular dialysis catheter are again noted and stable. A feeding catheter and tracheostomy tube are seen in place. Increased vascular congestion is noted as well as bilateral posterior effusions. Left basilar consolidation remains. IMPRESSION: Persistent bilateral pleural effusions and vascular congestion. Left retrocardiac consolidation remains. Electronically Signed   By: Inez Catalina M.D.   On: 09/14/2016 08:39   BMET    Component Value Date/Time   NA 134 (L) 09/15/2016 0727   K 4.4 09/15/2016 0727   CL 97 (L) 09/15/2016 0727   CO2 26 09/15/2016 0727  GLUCOSE 113 (H) 09/15/2016 0727   BUN 55 (H) 09/15/2016 0727   CREATININE 3.37 (H) 09/15/2016 0727   CREATININE 1.41 (H) 10/04/2012 1631   CALCIUM 8.7 (L) 09/15/2016 0727   GFRNONAA 14 (L) 09/15/2016 0727   GFRAA 16 (L) 09/15/2016 0727   CBC    Component Value Date/Time   WBC 17.3 (H) 09/15/2016 0345   RBC 2.79 (L) 09/15/2016 0345   HGB 8.2 (L) 09/15/2016 0345   HCT 26.4 (L) 09/15/2016 0345   PLT 394 09/15/2016 0345   MCV 94.6 09/15/2016 0345   MCH 29.4 09/15/2016 0345   MCHC 31.1 09/15/2016 0345   RDW 18.7 (H) 09/15/2016 0345   LYMPHSABS 2.3 08/23/2016 0328   MONOABS 4.1 (H) 08/23/2016 0328   EOSABS 0.5 08/23/2016 0328   BASOSABS 0.0 08/23/2016 0328     Assessment:  1.  Acute on CKD 3 in setting of cath/MVR, CABG X 1, Still no UO 2. SP MVR 3. MRSA bacteremia 4. DM 5. SP STEMI 6. Anemia on aranesp 7. Now in A fib  Plan: 1. Will plan HD today.   Nefi Musich T

## 2016-09-16 ENCOUNTER — Inpatient Hospital Stay (HOSPITAL_COMMUNITY): Payer: Medicaid Other

## 2016-09-16 ENCOUNTER — Other Ambulatory Visit (HOSPITAL_COMMUNITY): Payer: Self-pay

## 2016-09-16 DIAGNOSIS — I481 Persistent atrial fibrillation: Secondary | ICD-10-CM

## 2016-09-16 LAB — GLUCOSE, CAPILLARY
GLUCOSE-CAPILLARY: 80 mg/dL (ref 65–99)
GLUCOSE-CAPILLARY: 82 mg/dL (ref 65–99)
GLUCOSE-CAPILLARY: 66 mg/dL (ref 65–99)
GLUCOSE-CAPILLARY: 91 mg/dL (ref 65–99)
Glucose-Capillary: 181 mg/dL — ABNORMAL HIGH (ref 65–99)
Glucose-Capillary: 214 mg/dL — ABNORMAL HIGH (ref 65–99)
Glucose-Capillary: 238 mg/dL — ABNORMAL HIGH (ref 65–99)
Glucose-Capillary: 248 mg/dL — ABNORMAL HIGH (ref 65–99)
Glucose-Capillary: 71 mg/dL (ref 65–99)
Glucose-Capillary: 96 mg/dL (ref 65–99)

## 2016-09-16 LAB — PROTIME-INR
INR: 3.15
Prothrombin Time: 33.1 seconds — ABNORMAL HIGH (ref 11.4–15.2)

## 2016-09-16 LAB — CBC
HCT: 26.8 % — ABNORMAL LOW (ref 36.0–46.0)
Hemoglobin: 8.2 g/dL — ABNORMAL LOW (ref 12.0–15.0)
MCH: 28.9 pg (ref 26.0–34.0)
MCHC: 30.6 g/dL (ref 30.0–36.0)
MCV: 94.4 fL (ref 78.0–100.0)
Platelets: 411 10*3/uL — ABNORMAL HIGH (ref 150–400)
RBC: 2.84 MIL/uL — ABNORMAL LOW (ref 3.87–5.11)
RDW: 18 % — ABNORMAL HIGH (ref 11.5–15.5)
WBC: 14 10*3/uL — ABNORMAL HIGH (ref 4.0–10.5)

## 2016-09-16 LAB — RENAL FUNCTION PANEL
ANION GAP: 10 (ref 5–15)
Albumin: 2.4 g/dL — ABNORMAL LOW (ref 3.5–5.0)
BUN: 42 mg/dL — ABNORMAL HIGH (ref 6–20)
CALCIUM: 8.5 mg/dL — AB (ref 8.9–10.3)
CO2: 28 mmol/L (ref 22–32)
Chloride: 97 mmol/L — ABNORMAL LOW (ref 101–111)
Creatinine, Ser: 2.39 mg/dL — ABNORMAL HIGH (ref 0.44–1.00)
GFR calc non Af Amer: 21 mL/min — ABNORMAL LOW (ref >60)
GFR, EST AFRICAN AMERICAN: 24 mL/min — AB (ref >60)
GLUCOSE: 88 mg/dL (ref 65–99)
POTASSIUM: 3.9 mmol/L (ref 3.5–5.1)
Phosphorus: 3.3 mg/dL (ref 2.5–4.6)
SODIUM: 135 mmol/L (ref 135–145)

## 2016-09-16 LAB — COOXEMETRY PANEL
Carboxyhemoglobin: 1.4 % (ref 0.5–1.5)
METHEMOGLOBIN: 1.1 % (ref 0.0–1.5)
O2 SAT: 56.4 %
Total hemoglobin: 7.4 g/dL — ABNORMAL LOW (ref 12.0–16.0)

## 2016-09-16 MED ORDER — AMIODARONE HCL IN DEXTROSE 360-4.14 MG/200ML-% IV SOLN
30.0000 mg/h | INTRAVENOUS | Status: DC
  Administered 2016-09-16 – 2016-09-18 (×3): 30 mg/h via INTRAVENOUS
  Filled 2016-09-16 (×5): qty 200

## 2016-09-16 MED ORDER — AMIODARONE HCL IN DEXTROSE 360-4.14 MG/200ML-% IV SOLN
60.0000 mg/h | INTRAVENOUS | Status: AC
  Administered 2016-09-16: 60 mg/h via INTRAVENOUS
  Filled 2016-09-16: qty 200

## 2016-09-16 NOTE — Progress Notes (Signed)
Patient ID: Deborah [name], female   DOB: [birth year], 60 y.o.   MRN: [id number]   ADVANCED HF TEAM  60 y/o woman with DM2, HTN, HL and noncompliance presented to [facility] ER with infrerior posterior STEMI on 9/12. Taken to cath lab and found to have occluded small to moderate OM-1 branch. Underwent successful PCS with stent x 2 by Dr. Clayborn Bigness. Coronaries otherwise ok. EF 55-60% with severe MR confirmed by echo.   Post-cath developed respiratory failure and required intubation. Developed shock and renal failure with peak creatinine 3.4 (1.6 on admit). Transferred to [facility] for furthe management on 9/15.Marland Kitchen TEE showed normal LV (EF 60-65%) and normal RV function with ruptured posterior papillary muscle with severe posterior MR.  Underwent placement of Swan and IABP. S/P MVR 08/23/16.   Treated for group B Strep PNA.   Echo 9/23  RV and LV are normal EF 60%  Extubated 9/29.  However, picture consistent with septic shock on 9/30 so pressors started and she was re-intubated. S/P Trach 10/5.  Developed AFL S/P TEE DC-CV 09/04/16   Milrinone stopped 09/10/16, Coox fell to 30% -> 47.4% -> 60.1 with resumption of milrinone. Switched to dobutamine given HD, this am Coox stable at 56%.  Started on coumadin 09/13/16. INR goal 2-2.5 per Dr. Prescott Gum.  She is now getting intermittent HD.   Waiting for Cortrak to be replaced, missed amiodarone because of this and back in atrial fibrillation with HR in 90s.    Scheduled Meds: . atorvastatin  20 mg Oral q1800  . chlorhexidine gluconate (MEDLINE KIT)  15 mL Mouth Rinse BID  . clopidogrel  75 mg Oral Daily  . darbepoetin (ARANESP) injection - NON-DIALYSIS  100 mcg Subcutaneous Q Mon-1800  . famotidine  20 mg Oral Daily  . feeding supplement (NEPRO CARB STEADY)  1,000 mL Per Tube Q24H  . feeding supplement (PRO-STAT SUGAR FREE 64)  60 mL Per Tube TID  . Gerhardt's butt cream   Topical BID  . imipenem-cilastatin  250 mg Intravenous Q12H  . insulin aspart  0-24  Units Subcutaneous Q4H  . insulin detemir  10 Units Subcutaneous BID  . mouth rinse  15 mL Mouth Rinse 10 times per day  . midodrine  10 mg Oral TID WC  . multivitamin  15 mL Per Tube Daily  . QUEtiapine  50 mg Oral QHS  . Warfarin - Pharmacist Dosing Inpatient   Does not apply q1800   Continuous Infusions: . sodium chloride    . sodium chloride 10 mL/hr at 09/16/16 1000  . amiodarone    . amiodarone    . DOBUTamine 2.5 mcg/kg/min (09/16/16 1000)   PRN Meds:.Place/Maintain arterial line **AND** sodium chloride, fentaNYL (SUBLIMAZE) injection, HYDROcodone-acetaminophen, levalbuterol, ondansetron (ZOFRAN) IV, sodium chloride flush    Vitals:   09/16/16 0743 09/16/16 0800 09/16/16 0900 09/16/16 1000  BP: 114/68 (!) 98/55 (!) 95/56 104/64  Pulse: (!) 107 (!) 108 (!) 110 (!) 112  Resp: 20 (!) _0 Temp:  98.2 F (36.8 C)    TempSrc:  Oral    SpO2: 99% 99% 100% 100%  Weight:      Height:        Intake/Output Summary (Last 24 hours) at 09/16/16 1136 Last data filed at 09/16/16 1000  Gross per 24 hour  Intake          1131.44 ml  Output             1840 ml  Net          -  708.56 ml    LABS: Basic Metabolic Panel:  Recent Labs  09/14/16 0350 09/15/16 0727 09/16/16 0417  NA 136 134* 135  K 3.9 4.4 3.9  CL 101 97* 97*  CO2 _0 GLUCOSE 189* 113* 88  BUN 29* 55* 42*  CREATININE 2.07* 3.37* 2.39*  CALCIUM 8.3* 8.7* 8.5*  PHOS 2.0*  --  3.3   Liver Function Tests:  Recent Labs  09/14/16 0350 09/16/16 0417  ALBUMIN 2.5* 2.4*   No results for input(s): LIPASE, AMYLASE in the last 72 hours. CBC:  Recent Labs  09/15/16 0345 09/16/16 0417  WBC 17.3* 14.0*  HGB 8.2* 8.2*  HCT 26.4* 26.8*  MCV 94.6 94.4  PLT 394 411*   Cardiac Enzymes: No results for input(s): CKTOTAL, CKMB, CKMBINDEX, TROPONINI in the last 72 hours. BNP: Invalid input(s): POCBNP D-Dimer: No results for input(s): DDIMER in the last 72 hours. Hemoglobin A1C: No results for  input(s): HGBA1C in the last 72 hours. Fasting Lipid Panel: No results for input(s): CHOL, HDL, LDLCALC, TRIG, CHOLHDL, LDLDIRECT in the last 72 hours. Thyroid Function Tests: No results for input(s): TSH, T4TOTAL, T3FREE, THYROIDAB in the last 72 hours.  Invalid input(s): FREET3 Anemia Panel: No results for input(s): VITAMINB12, FOLATE, FERRITIN, TIBC, IRON, RETICCTPCT in the last 72 hours.  PHYSICAL EXAM General: lying in bed, NAD Neck: RIJ TLC LIJ trialysis trach Lungs: Diminished bases. CV: Nondisplaced PMI.  Heart tachy, regular S1/S2, no S3/S4, 1/6 SEM USB.   Abdomen: Soft, NT, ND, no HSM. No bruits or masses. +BS  Neurologic: Sleepy but nonfocal. Hoarse voice   Extremities: No clubbing or cyanosis. R and LLE SCDs.   TELEMETRY: Reviewed, Sinus tach, 100s    Assessment:   1. Cardiogenic shock: Due to acute severe MR. EF 70% on 9/25 echo.  2. CAD: Inferior posterior STEMI on 9/13 with DES x 2 to OM-1      --Plavix restarted on 9/22 3. 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     --s/p MVR 08/23/2016  4. AKI on CKD stage III - due to ATN likely from cardiogenic shock + contrast with cath. Has required CVVH.  5. Acute respiratory failure: Pulmonary edema.  6. DM2 7. Suspect component of septic shock: Possible PNA, group B Strep in sputum culture.  8. Anemia 9. PVCs   10. Shock liver 11. MRSA bacteremia 12. Atrial fibrillation: Paroxysmal.  On amiodarone.    Plan/Discussion:    Stable s/p trach now on trach collar. Awaiting swallow eval.   Missed po amiodarone with Cortrak out, waiting replacement.  Back in atrial fibrillation.  Will restart amiodarone infusion. She is anticoagulated.  Coox 56% on dobutamine 2.5 mcg/kg/min. Continue midodrine to support BP. EF was normal on last echo on 9/25.  Will repeat echo today hopefully to reassess LV and RV.  Hopefully can get off dobutamine soon if LV  and RV systolic function in normal range still.   Remains on Plavix with recent PCI.   Remains on abx for possible PNA.    Ongoing intermittent HD.   Loralie Champagne 09/16/2016 11:36 AM

## 2016-09-16 NOTE — Progress Notes (Signed)
S:    Sitting up in chair.  No new CO O:BP 103/74   Pulse (!) 113   Temp 98.3 F (36.8 C) (Oral)   Resp 17   Ht _0  (1.549 m)   Wt 64.8 kg (142 lb 13.7 oz)   SpO2 99%   BMI 26.99 kg/m   Intake/Output Summary (Last 24 hours) at 09/16/16 0732 Last data filed at 09/16/16 0700  Gross per 24 hour  Intake          1064.14 ml  Output             1840 ml  Net          -775.86 ml   Weight change: 0 kg (0 lb) HUT:MLYYT and alert.  Trached. CVS: irreg,irreg, sl tachy 2/6 systolic M Resp: Decreased BS bases Abd: +BS NTND Ext: 0-tr edema NEURO: Follows commands Rt IJ temp cath Lt IJ HD cath   . amiodarone  200 mg Oral BID  . atorvastatin  20 mg Oral q1800  . chlorhexidine gluconate (MEDLINE KIT)  15 mL Mouth Rinse BID  . clopidogrel  75 mg Oral Daily  . darbepoetin (ARANESP) injection - NON-DIALYSIS  100 mcg Subcutaneous Q Mon-1800  . famotidine  20 mg Oral Daily  . feeding supplement (NEPRO CARB STEADY)  1,000 mL Per Tube Q24H  . feeding supplement (PRO-STAT SUGAR FREE 64)  60 mL Per Tube TID  . Gerhardt's butt cream   Topical BID  . imipenem-cilastatin  250 mg Intravenous Q12H  . insulin aspart  0-24 Units Subcutaneous Q4H  . insulin detemir  10 Units Subcutaneous BID  . mouth rinse  15 mL Mouth Rinse 10 times per day  . midodrine  10 mg Oral TID WC  . multivitamin  15 mL Per Tube Daily  . QUEtiapine  50 mg Oral QHS  . Warfarin - Pharmacist Dosing Inpatient   Does not apply q1800   Dg Chest Port 1 View  Result Date: 09/15/2016 CLINICAL DATA:  Shortness breath. Congestive heart failure. Postop from mitral valve replacement. Acute kidney injury. EXAM: PORTABLE CHEST 1 VIEW COMPARISON:  09/14/2016 FINDINGS: The feeding tube is been removed. Other support lines and tubes remain in appropriate position. Decreased bilateral airspace disease is seen, consistent decreased pulmonary edema. There is persistent bibasilar atelectasis and probable small bilateral pleural effusions.  Cardiomegaly stable. Previous mitral valve replacement noted. No pneumothorax identified. IMPRESSION: Decreased bilateral airspace disease, consistent with improving pulmonary edema. Persistent bibasilar atelectasis and probable small bilateral pleural effusions. Electronically Signed   By: Earle Gell M.D.   On: 09/15/2016 08:13   Dg Abd Portable 1v  Result Date: 09/15/2016 CLINICAL DATA:  Encounter for feeding tube placement. EXAM: PORTABLE ABDOMEN - 1 VIEW COMPARISON:  09/11/2016 FINDINGS: Feeding tube is in place with the tip in the very pyloric region. Nonobstructive bowel gas pattern. No free air organomegaly. IMPRESSION: Feeding tube tip in the transpyloric region, possibly distal stomach or proximal duodenum. Electronically Signed   By: Rolm Baptise M.D.   On: 09/15/2016 10:10   BMET    Component Value Date/Time   NA 135 09/16/2016 0417   K 3.9 09/16/2016 0417   CL 97 (L) 09/16/2016 0417   CO2 28 09/16/2016 0417   GLUCOSE 88 09/16/2016 0417   BUN 42 (H) 09/16/2016 0417   CREATININE 2.39 (H) 09/16/2016 0417   CREATININE 1.41 (H) 10/04/2012 1631   CALCIUM 8.5 (L) 09/16/2016 0417   GFRNONAA 21 (L) 09/16/2016 0354  GFRAA 24 (L) 09/16/2016 0417   CBC    Component Value Date/Time   WBC 14.0 (H) 09/16/2016 0417   RBC 2.84 (L) 09/16/2016 0417   HGB 8.2 (L) 09/16/2016 0417   HCT 26.8 (L) 09/16/2016 0417   PLT 411 (H) 09/16/2016 0417   MCV 94.4 09/16/2016 0417   MCH 28.9 09/16/2016 0417   MCHC 30.6 09/16/2016 0417   RDW 18.0 (H) 09/16/2016 0417   LYMPHSABS 2.3 08/23/2016 0328   MONOABS 4.1 (H) 08/23/2016 0328   EOSABS 0.5 08/23/2016 0328   BASOSABS 0.0 08/23/2016 0328     Assessment:  1. Acute on CKD 3 in setting of cath/MVR, CABG X 1, Still no UO.  Puller 1.7L with HD yest 2. SP MVR 3. MRSA bacteremia 4. DM 5. SP STEMI 6. Anemia on aranesp 7.  A fib  Plan: 1. Will plan HD next week.  Metabolically stable   Deborah [name] T

## 2016-09-16 NOTE — Progress Notes (Signed)
ANTICOAGULATION CONSULT NOTE - Follow Up Consult  Pharmacy Consult for Coumadin Indication: Aflutter  No Known Allergies  Patient Measurements: Height: 5\' 1"  (154.9 cm) Weight: 142 lb 13.7 oz (64.8 kg) IBW/kg (Calculated) : 47.8  Vital Signs: Temp: 97.6 F (36.4 C) (10/14 1200) Temp Source: Oral (10/14 1200) BP: 91/61 (10/14 1200) Pulse Rate: 99 (10/14 1200)  Labs:  Recent Labs  09/14/16 0350 09/14/16 0715 09/14/16 1900 09/15/16 0345 09/15/16 0350 09/15/16 0727 09/16/16 0417  HGB 9.0*  --   --  8.2*  --   --  8.2*  HCT 28.3*  --   --  26.4*  --   --  26.8*  PLT 361  --   --  394  --   --  411*  LABPROT 17.5*  --   --  23.2*  --   --  33.1*  INR 1.43  --   --  2.02  --   --  3.15  HEPARINUNFRC  --  0.28* <0.10*  --  0.35  --   --   CREATININE 2.07*  --   --   --   --  3.37* 2.39*    Estimated Creatinine Clearance: 21.8 mL/min (by C-G formula based on SCr of 2.39 mg/dL (H)).   Assessment: Anticoag: new aflutter, Added coumadin 10/11,  d/c hep 10/13 due large jump in INR. INR 1.43>2.02>3.15. Hgb 8.2 stable.  - INR goal 2-2.5 per Dr. Prescott Gum.  Goal of Therapy:  INR 2-2.5 Monitor platelets by anticoagulation protocol: Yes   Plan:  1) Hold coumadin again tonight  2) Daily INR, CBC   Deborah [name] S. Deborah [name], PharmD, BCPS Clinical Staff Pharmacist Pager [phone]  Deborah [name] Deborah [name] 09/16/2016,12:20 PM

## 2016-09-16 NOTE — Progress Notes (Signed)
16 Days Post-Op Procedure(s) (LRB): INSERTION OF DIALYSIS CATHETER LEFT INTERNAL JUGULAR VEIN & INSERTION OF TRIPLE LUMEN RIGHT INTERNAL JUGULAR VEIN (Bilateral) Subjective: trach downsized NPO per SLT cortrak to be placed OOB to chair- standing WBC improved for HCAP Objective: Vital signs in last 24 hours: Temp:  [97.3 F (36.3 C)-98.3 F (36.8 C)] 98.2 F (36.8 C) (10/14 0800) Pulse Rate:  [102-119] 112 (10/14 1000) Cardiac Rhythm: Atrial fibrillation (10/14 1000) Resp:  [17-37] 17 (10/14 1000) BP: (79-124)/(53-104) 104/64 (10/14 1000) SpO2:  [95 %-100 %] 100 % (10/14 1000) FiO2 (%):  [28 %] 28 % (10/14 1000) Weight:  [142 lb 13.7 oz (64.8 kg)-146 lb 9.7 oz (66.5 kg)] 142 lb 13.7 oz (64.8 kg) (10/13 1718)  Hemodynamic parameters for last 24 hours:    Intake/Output from previous day: 10/13 0701 - 10/14 0700 In: 1064.1 [I.V.:427.1; NG/GT:437; IV Piggyback:200] Out: 1840 [Stool:100] Intake/Output this shift: Total I/O In: 137.5 [I.V.:37.5; IV Piggyback:100] Out: 0        Exam    General- alert and comfortable   Lungs- clear without rales, wheezes   Cor- regular rate and rhythm, no murmur , gallop   Abdomen- soft, non-tender   Extremities - warm, non-tender, minimal edema   Neuro- oriented, appropriate, no focal weakness   Lab Results:  Recent Labs  09/15/16 0345 09/16/16 0417  WBC 17.3* 14.0*  HGB 8.2* 8.2*  HCT 26.4* 26.8*  PLT 394 411*   BMET:  Recent Labs  09/15/16 0727 09/16/16 0417  NA 134* 135  K 4.4 3.9  CL 97* 97*  CO2 26 28  GLUCOSE 113* 88  BUN 55* 42*  CREATININE 3.37* 2.39*  CALCIUM 8.7* 8.5*    PT/INR:  Recent Labs  09/16/16 0417  LABPROT 33.1*  INR 3.15   ABG    Component Value Date/Time   PHART 7.541 (H) 09/08/2016 0412   HCO3 25.2 09/08/2016 0412   TCO2 26 09/08/2016 0412   ACIDBASEDEF 4.1 (H) 09/02/2016 1850   O2SAT 56.4 09/16/2016 0400   CBG (last 3)    Recent Labs  09/16/16 0832 09/16/16 0835 09/16/16 1109   GLUCAP 66 71 91    Assessment/Plan: S/P Procedure(s) (LRB): INSERTION OF DIALYSIS CATHETER LEFT INTERNAL JUGULAR VEIN & INSERTION OF TRIPLE LUMEN RIGHT INTERNAL JUGULAR VEIN (Bilateral) cont tube feeds   LOS: 29 days  No coumadin today  Tharon Aquas Trigt III 09/16/2016

## 2016-09-16 NOTE — Progress Notes (Signed)
Notified Cortrak team of pt needing new cortrak.  States, "will come in next 2hr or so"

## 2016-09-16 NOTE — Progress Notes (Signed)
Attempted to call on call cortrak team member to notify of pt needing new cortrak placed.  No answer at this time.

## 2016-09-17 ENCOUNTER — Inpatient Hospital Stay (HOSPITAL_COMMUNITY): Payer: Medicaid Other

## 2016-09-17 DIAGNOSIS — I509 Heart failure, unspecified: Secondary | ICD-10-CM

## 2016-09-17 LAB — GLUCOSE, CAPILLARY
GLUCOSE-CAPILLARY: 171 mg/dL — AB (ref 65–99)
GLUCOSE-CAPILLARY: 198 mg/dL — AB (ref 65–99)
GLUCOSE-CAPILLARY: 184 mg/dL — AB (ref 65–99)
Glucose-Capillary: 149 mg/dL — ABNORMAL HIGH (ref 65–99)
Glucose-Capillary: 220 mg/dL — ABNORMAL HIGH (ref 65–99)

## 2016-09-17 LAB — CULTURE, RESPIRATORY W GRAM STAIN

## 2016-09-17 LAB — CBC
HCT: 26.7 % — ABNORMAL LOW (ref 36.0–46.0)
Hemoglobin: 8.2 g/dL — ABNORMAL LOW (ref 12.0–15.0)
MCH: 29.2 pg (ref 26.0–34.0)
MCHC: 30.7 g/dL (ref 30.0–36.0)
MCV: 95 fL (ref 78.0–100.0)
Platelets: 401 10*3/uL — ABNORMAL HIGH (ref 150–400)
RBC: 2.81 MIL/uL — ABNORMAL LOW (ref 3.87–5.11)
RDW: 17.8 % — ABNORMAL HIGH (ref 11.5–15.5)
WBC: 14.7 10*3/uL — ABNORMAL HIGH (ref 4.0–10.5)

## 2016-09-17 LAB — COOXEMETRY PANEL
Carboxyhemoglobin: 1.8 % — ABNORMAL HIGH (ref 0.5–1.5)
METHEMOGLOBIN: 0.9 % (ref 0.0–1.5)
O2 Saturation: 56.1 %
TOTAL HEMOGLOBIN: 8.5 g/dL — AB (ref 12.0–16.0)

## 2016-09-17 LAB — RENAL FUNCTION PANEL
ALBUMIN: 2.3 g/dL — AB (ref 3.5–5.0)
Anion gap: 12 (ref 5–15)
BUN: 67 mg/dL — AB (ref 6–20)
CALCIUM: 8.5 mg/dL — AB (ref 8.9–10.3)
CO2: 25 mmol/L (ref 22–32)
CREATININE: 3.63 mg/dL — AB (ref 0.44–1.00)
Chloride: 95 mmol/L — ABNORMAL LOW (ref 101–111)
GFR calc Af Amer: 15 mL/min — ABNORMAL LOW (ref >60)
GFR, EST NON AFRICAN AMERICAN: 13 mL/min — AB (ref >60)
GLUCOSE: 156 mg/dL — AB (ref 65–99)
POTASSIUM: 3.8 mmol/L (ref 3.5–5.1)
Phosphorus: 4.7 mg/dL — ABNORMAL HIGH (ref 2.5–4.6)
SODIUM: 132 mmol/L — AB (ref 135–145)

## 2016-09-17 LAB — PROTIME-INR
INR: 3.53
PROTHROMBIN TIME: 36.2 s — AB (ref 11.4–15.2)

## 2016-09-17 LAB — ECHOCARDIOGRAM COMPLETE
Height: 61 in
Weight: 2317.48 [oz_av]

## 2016-09-17 NOTE — Progress Notes (Signed)
S:     No new CO O:BP 125/78   Pulse 85   Temp 97.2 F (36.2 C) (Oral)   Resp (!) 24   Ht 5' 1"  (1.549 m)   Wt 65.7 kg (144 lb 13.5 oz)   SpO2 96%   BMI 27.37 kg/m   Intake/Output Summary (Last 24 hours) at 09/17/16 0711 Last data filed at 09/17/16 0600  Gross per 24 hour  Intake         15909.09 ml  Output              300 ml  Net         15609.09 ml   Weight change: -1.7 kg (-3 lb 12 oz) QZE:SPQZR and alert.  Trached. CVS: RRR 2/6 systolic M Resp: Decreased BS bases Abd: +BS NTND Ext: No edema NEURO: Follows commands Rt IJ temp cath Lt IJ HD cath   . atorvastatin  20 mg Oral q1800  . chlorhexidine gluconate (MEDLINE KIT)  15 mL Mouth Rinse BID  . clopidogrel  75 mg Oral Daily  . darbepoetin (ARANESP) injection - NON-DIALYSIS  100 mcg Subcutaneous Q Mon-1800  . famotidine  20 mg Oral Daily  . feeding supplement (NEPRO CARB STEADY)  1,000 mL Per Tube Q24H  . feeding supplement (PRO-STAT SUGAR FREE 64)  60 mL Per Tube TID  . Gerhardt's butt cream   Topical BID  . insulin aspart  0-24 Units Subcutaneous Q4H  . insulin detemir  10 Units Subcutaneous BID  . mouth rinse  15 mL Mouth Rinse 10 times per day  . midodrine  10 mg Oral TID WC  . multivitamin  15 mL Per Tube Daily  . QUEtiapine  50 mg Oral QHS  . Warfarin - Pharmacist Dosing Inpatient   Does not apply q1800   Dg Chest Port 1 View  Result Date: 09/16/2016 CLINICAL DATA:  History of mitral valve repair. EXAM: PORTABLE CHEST 1 VIEW COMPARISON:  September 15, 2016 FINDINGS: The tracheostomy tube, right central line, and double lumen left central line are stable. No pneumothorax. Small layering effusions and underlying atelectasis. No overt edema. Stable cardiomediastinal silhouette. IMPRESSION: Stable support apparatus, small bilateral layering effusions, an underlying atelectasis. Electronically Signed   By: Dorise Bullion III M.D   On: 09/16/2016 09:37   Dg Abd Portable 1v  Result Date: 09/16/2016 CLINICAL  DATA:  60 year old female with a history of G-tube placement EXAM: PORTABLE ABDOMEN - 1 VIEW COMPARISON:  00/76/2263 FINDINGS: Metallic tip feeding tube unchanged in position from the prior plain film, with the tip in the region of the pylorus/ first portion of the duodenum. No gastric tube visualized. Gas within colon without evidence of abnormal distention. Epicardial pacer wires project over the upper abdomen. No displaced fracture. IMPRESSION: Unchanged position of the metallic tip feeding tube, which terminates near the pylorus/ first portion the duodenum. No gastric tube visualized. Unchanged position of cardiac pacing leads. Unremarkable bowel gas pattern. Signed, Dulcy Fanny. Earleen Newport, DO [facility] Electronically Signed   By: Corrie Mckusick D.O.   On: 09/16/2016 14:41   Dg Abd Portable 1v  Result Date: 09/15/2016 CLINICAL DATA:  Encounter for feeding tube placement. EXAM: PORTABLE ABDOMEN - 1 VIEW COMPARISON:  09/11/2016 FINDINGS: Feeding tube is in place with the tip in the very pyloric region. Nonobstructive bowel gas pattern. No free air organomegaly. IMPRESSION: Feeding tube tip in the transpyloric region, possibly distal stomach or proximal duodenum. Electronically Signed  By: Rolm Baptise M.D.   On: 09/15/2016 10:10   BMET    Component Value Date/Time   NA 132 (L) 09/17/2016 0350   K 3.8 09/17/2016 0350   CL 95 (L) 09/17/2016 0350   CO2 25 09/17/2016 0350   GLUCOSE 156 (H) 09/17/2016 0350   BUN 67 (H) 09/17/2016 0350   CREATININE 3.63 (H) 09/17/2016 0350   CREATININE 1.41 (H) 10/04/2012 1631   CALCIUM 8.5 (L) 09/17/2016 0350   GFRNONAA 13 (L) 09/17/2016 0350   GFRAA 15 (L) 09/17/2016 0350   CBC    Component Value Date/Time   WBC 14.7 (H) 09/17/2016 0345   RBC 2.81 (L) 09/17/2016 0345   HGB 8.2 (L) 09/17/2016 0345   HCT 26.7 (L) 09/17/2016 0345   PLT 401 (H) 09/17/2016 0345   MCV 95.0 09/17/2016 0345   MCH 29.2  09/17/2016 0345   MCHC 30.7 09/17/2016 0345   RDW 17.8 (H) 09/17/2016 0345   LYMPHSABS 2.3 08/23/2016 0328   MONOABS 4.1 (H) 08/23/2016 0328   EOSABS 0.5 08/23/2016 0328   BASOSABS 0.0 08/23/2016 0328     Assessment:  1. Acute on CKD 3 in setting of cath/MVR, CABG X 1, Still no UO.   2. SP MVR 3. MRSA bacteremia 4. DM 5. SP STEMI 6. Anemia on aranesp 7.  A fib, now in NSR on amio  Plan: 1. Will plan HD tomorrow.     Delaila Nand T

## 2016-09-17 NOTE — Progress Notes (Signed)
ANTICOAGULATION CONSULT NOTE - Follow Up Consult  Pharmacy Consult for Coumadin Indication: Aflutter  No Known Allergies  Patient Measurements: Height: 5\' 1"  (154.9 cm) Weight: 144 lb 13.5 oz (65.7 kg) IBW/kg (Calculated) : 47.8  Vital Signs: Temp: 96.3 F (35.7 C) (10/15 0755) Temp Source: Axillary (10/15 0755) BP: 116/68 (10/15 0755) Pulse Rate: 80 (10/15 0700)  Labs:  Recent Labs  09/14/16 1900  09/15/16 0345 09/15/16 0350 09/15/16 0727 09/16/16 0417 09/17/16 0345 09/17/16 0350  HGB  --   < > 8.2*  --   --  8.2* 8.2*  --   HCT  --   --  26.4*  --   --  26.8* 26.7*  --   PLT  --   --  394  --   --  411* 401*  --   LABPROT  --   --  23.2*  --   --  33.1* 36.2*  --   INR  --   --  2.02  --   --  3.15 3.53  --   HEPARINUNFRC <0.10*  --   --  0.35  --   --   --   --   CREATININE  --   --   --   --  3.37* 2.39*  --  3.63*  < > = values in this interval not displayed.  Estimated Creatinine Clearance: 14.5 mL/min (by C-G formula based on SCr of 3.63 mg/dL (H)).   Assessment: Anticoag: new aflutter, Added coumadin 10/11,  d/c hep 10/13 due large jump in INR. INR 1.43>2.02>3.15>3.53. Hgb 8.2 stable.  - INR goal 2-2.5 per Dr. Prescott Gum.  Goal of Therapy:  INR 2-2.5 Monitor platelets by anticoagulation protocol: Yes   Plan:  1) Hold coumadin again tonight  2) Daily INR, CBC   Waver Dibiasio S. Alford Highland, PharmD, BCPS Clinical Staff Pharmacist Pager [phone]  [name] 09/17/2016,11:22 AM

## 2016-09-17 NOTE — Progress Notes (Signed)
17 Days Post-Op Procedure(s) (LRB): INSERTION OF DIALYSIS CATHETER LEFT INTERNAL JUGULAR VEIN & INSERTION OF TRIPLE LUMEN RIGHT INTERNAL JUGULAR VEIN (Bilateral) Subjective: Fairly heavy airway secretions requiring suctioning via trac echocardiogram done today shows normal LV function, normal functioning mitral valve Currently in sinus rhythm Remains on tube feeds because of swallowing dysfunction  Objective: Vital signs in last 24 hours: Temp:  [96.3 F (35.7 C)-98.6 F (37 C)] 97.4 F (36.3 C) (10/15 1518) Pulse Rate:  [72-95] 72 (10/15 1900) Cardiac Rhythm: Atrial fibrillation (10/15 0800) Resp:  [17-32] 31 (10/15 1900) BP: (82-151)/(50-101) 113/79 (10/15 1900) SpO2:  [91 %-100 %] 95 % (10/15 1900) FiO2 (%):  [28 %] 28 % (10/15 1518) Weight:  [144 lb 13.5 oz (65.7 kg)] 144 lb 13.5 oz (65.7 kg) (10/15 0600)  Hemodynamic parameters for last 24 hours:    Intake/Output from previous day: 10/14 0701 - 10/15 0700 In: 15978.3 [I.V.:15150.3; NG/GT:628; IV Piggyback:200] Out: 300 [Stool:300] Intake/Output this shift: No intake/output data recorded.  Mildly coarse breath sounds  Lab Results:  Recent Labs  09/16/16 0417 09/17/16 0345  WBC 14.0* 14.7*  HGB 8.2* 8.2*  HCT 26.8* 26.7*  PLT 411* 401*   BMET:  Recent Labs  09/16/16 0417 09/17/16 0350  NA 135 132*  K 3.9 3.8  CL 97* 95*  CO2 28 25  GLUCOSE 88 156*  BUN 42* 67*  CREATININE 2.39* 3.63*  CALCIUM 8.5* 8.5*    PT/INR:  Recent Labs  09/17/16 0345  LABPROT 36.2*  INR 3.53   ABG    Component Value Date/Time   PHART 7.541 (H) 09/08/2016 0412   HCO3 25.2 09/08/2016 0412   TCO2 26 09/08/2016 0412   ACIDBASEDEF 4.1 (H) 09/02/2016 1850   O2SAT 56.1 09/17/2016 0355   CBG (last 3)   Recent Labs  09/17/16 0817 09/17/16 1156 09/17/16 1632  GLUCAP 171* 198* 184*    Assessment/Plan: S/P Procedure(s) (LRB): INSERTION OF DIALYSIS CATHETER LEFT INTERNAL JUGULAR VEIN & INSERTION OF TRIPLE LUMEN RIGHT  INTERNAL JUGULAR VEIN (Bilateral) Now on dobutamine-follow: Ox although LV function looks normal on echo today   LOS: 30 days    Tharon Aquas Trigt III 09/17/2016

## 2016-09-17 NOTE — Progress Notes (Signed)
  Echocardiogram 2D Echocardiogram has been performed.  Jennette Dubin 09/17/2016, 9:59 AM

## 2016-09-17 NOTE — Progress Notes (Signed)
Patient ID: Deborah [name], female   DOB: [birth year], 60 y.o.   MRN: [id number]   ADVANCED HF TEAM  60 y/o woman with DM2, HTN, HL and noncompliance presented to [facility] ER with infrerior posterior STEMI on 9/12. Taken to cath lab and found to have occluded small to moderate OM-1 branch. Underwent successful PCS with stent x 2 by Dr. Clayborn Bigness. Coronaries otherwise ok. EF 55-60% with severe MR confirmed by echo.   Post-cath developed respiratory failure and required intubation. Developed shock and renal failure with peak creatinine 3.4 (1.6 on admit). Transferred to [facility] for furthe management on 9/15.Marland Kitchen TEE showed normal LV (EF 60-65%) and normal RV function with ruptured posterior papillary muscle with severe posterior MR.  Underwent placement of Swan and IABP. S/P MVR 08/23/58.   Treated for group B Strep PNA.   Echo 9/23  RV and LV are normal EF 60%  Extubated 9/29.  However, picture consistent with septic shock on 9/30 so pressors started and she was re-intubated. S/P Trach 10/5.  Developed AFL S/P TEE DC-CV 09/04/16   Milrinone stopped 09/10/16, Coox fell to 30% -> 47.4% -> 60.1 with resumption of milrinone. Switched to dobutamine given HD, this am Coox stable at 56%.  Started on coumadin 09/13/16. INR goal 2-2.5 per Dr. Prescott Gum.  She is now getting intermittent HD, plan for HD again tomorrow.   Now on amiodarone gtt, HR in 80s, may still be in atrial fibrillation but rhythm difficult.    Echo 09/17/16: Normal LV size with mild LV hypertrophy. EF 60-65%. Normal RV   size with mildly decreased systolic function. No evidence for   pulmonary hypertension. The interventricular septum is shifted   mildly to the left but there is no evidence for marked RV   abnormality. The bioprosthetic mitral valve appears to function   normally.  Scheduled Meds: . atorvastatin  20 mg Oral q1800  . chlorhexidine gluconate (MEDLINE KIT)  15 mL Mouth Rinse BID  . clopidogrel  75 mg Oral Daily  .  darbepoetin (ARANESP) injection - NON-DIALYSIS  100 mcg Subcutaneous Q Mon-1800  . famotidine  20 mg Oral Daily  . feeding supplement (NEPRO CARB STEADY)  1,000 mL Per Tube Q24H  . feeding supplement (PRO-STAT SUGAR FREE 64)  60 mL Per Tube TID  . Gerhardt's butt cream   Topical BID  . insulin aspart  0-24 Units Subcutaneous Q4H  . insulin detemir  10 Units Subcutaneous BID  . mouth rinse  15 mL Mouth Rinse 10 times per day  . midodrine  10 mg Oral TID WC  . multivitamin  15 mL Per Tube Daily  . QUEtiapine  50 mg Oral QHS  . Warfarin - Pharmacist Dosing Inpatient   Does not apply q1800   Continuous Infusions: . sodium chloride    . sodium chloride 20 mL/hr at 09/17/16 0700  . amiodarone 30 mg/hr (09/17/16 0700)   PRN Meds:.Place/Maintain arterial line **AND** sodium chloride, fentaNYL (SUBLIMAZE) injection, HYDROcodone-acetaminophen, levalbuterol, ondansetron (ZOFRAN) IV, sodium chloride flush    Vitals:   09/17/16 0530 09/17/16 0600 09/17/16 0700 09/17/16 0755  BP:  125/78 (!) 109/91 116/68  Pulse: 88 85 80   Resp: 17 (!) 24 (!) 27 19  Temp:    (!) 96.3 F (35.7 C)  TempSrc:    Axillary  SpO2: 95% 96% 97% 95%  Weight:  144 lb 13.5 oz (65.7 kg)    Height:        Intake/Output Summary (Last 24  hours) at 09/17/16 1036 Last data filed at 09/17/16 0700  Gross per 24 hour  Intake         15840.79 ml  Output              300 ml  Net         15540.79 ml    LABS: Basic Metabolic Panel:  Recent Labs  09/16/16 0417 09/17/16 0350  NA 135 132*  K 3.9 3.8  CL 97* 95*  CO2 28 25  GLUCOSE 88 156*  BUN 42* 67*  CREATININE 2.39* 3.63*  CALCIUM 8.5* 8.5*  PHOS 3.3 4.7*   Liver Function Tests:  Recent Labs  09/16/16 0417 09/17/16 0350  ALBUMIN 2.4* 2.3*   No results for input(s): LIPASE, AMYLASE in the last 72 hours. CBC:  Recent Labs  09/16/16 0417 09/17/16 0345  WBC 14.0* 14.7*  HGB 8.2* 8.2*  HCT 26.8* 26.7*  MCV 94.4 95.0  PLT 411* 401*   Cardiac  Enzymes: No results for input(s): CKTOTAL, CKMB, CKMBINDEX, TROPONINI in the last 72 hours. BNP: Invalid input(s): POCBNP D-Dimer: No results for input(s): DDIMER in the last 72 hours. Hemoglobin A1C: No results for input(s): HGBA1C in the last 72 hours. Fasting Lipid Panel: No results for input(s): CHOL, HDL, LDLCALC, TRIG, CHOLHDL, LDLDIRECT in the last 72 hours. Thyroid Function Tests: No results for input(s): TSH, T4TOTAL, T3FREE, THYROIDAB in the last 72 hours.  Invalid input(s): FREET3 Anemia Panel: No results for input(s): VITAMINB12, FOLATE, FERRITIN, TIBC, IRON, RETICCTPCT in the last 72 hours.  PHYSICAL EXAM General: lying in bed, NAD Neck: Trach, JVP difficult Lungs: Diminished at bases. CV: Nondisplaced PMI.  Heart regular S1/S2, no S3/S4, 1/6 SEM USB.   Abdomen: Soft, NT, ND, no HSM. No bruits or masses. +BS  Neurologic: Sleepy but nonfocal. Hoarse voice   Extremities: No clubbing or cyanosis. R and LLE SCDs.   TELEMETRY: Reviewed, ?atrial fibrillation in 80s     Assessment:   1. Cardiogenic shock: Due to acute severe MR. EF 70% on 9/25 echo.  2. CAD: Inferior posterior STEMI on 9/13 with DES x 2 to OM-1      --Plavix restarted on 9/22 3. 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     --s/p MVR 08/23/2016  4. AKI on CKD stage III - due to ATN likely from cardiogenic shock + contrast with cath. Has required CVVH.  5. Acute respiratory failure: Pulmonary edema.  6. DM2 7. Suspect component of septic shock: Possible PNA, group B Strep in sputum culture.  8. Anemia 9. PVCs   10. Shock liver 11. MRSA bacteremia 12. Atrial fibrillation: Paroxysmal.  On amiodarone.    Plan/Discussion:    Stable s/p trach now on trach collar.    She is anticoagulated and is on amiodarone gtt with atrial fibrillation.  Rhythm today is difficult, rate is lower in 80s.  Will get ECG to see if NSR.   Coox  56% on dobutamine 2.5 mcg/kg/min. Continue midodrine to support BP. Echo (see above) showed normal LV systolic function and probably only mildly depressed RV systolic function.  No evidence for pulmonary hypertension and bioprosthetic mitral valve functioning normally.  I am going to try her off dobutamine, stop today.   Remains on Plavix with recent PCI.   Ongoing intermittent HD, next planned for tomorrow.   Loralie Champagne 09/17/2016 10:36 AM

## 2016-09-18 ENCOUNTER — Inpatient Hospital Stay (HOSPITAL_COMMUNITY): Payer: Medicaid Other

## 2016-09-18 DIAGNOSIS — R410 Disorientation, unspecified: Secondary | ICD-10-CM

## 2016-09-18 DIAGNOSIS — Z9889 Other specified postprocedural states: Secondary | ICD-10-CM

## 2016-09-18 DIAGNOSIS — L8990 Pressure ulcer of unspecified site, unspecified stage: Secondary | ICD-10-CM | POA: Insufficient documentation

## 2016-09-18 LAB — RENAL FUNCTION PANEL
ALBUMIN: 2.5 g/dL — AB (ref 3.5–5.0)
Anion gap: 14 (ref 5–15)
BUN: 98 mg/dL — AB (ref 6–20)
CALCIUM: 9 mg/dL (ref 8.9–10.3)
CO2: 23 mmol/L (ref 22–32)
CREATININE: 4.39 mg/dL — AB (ref 0.44–1.00)
Chloride: 95 mmol/L — ABNORMAL LOW (ref 101–111)
GFR calc Af Amer: 12 mL/min — ABNORMAL LOW (ref >60)
GFR, EST NON AFRICAN AMERICAN: 10 mL/min — AB (ref >60)
Glucose, Bld: 138 mg/dL — ABNORMAL HIGH (ref 65–99)
PHOSPHORUS: 5.9 mg/dL — AB (ref 2.5–4.6)
POTASSIUM: 4 mmol/L (ref 3.5–5.1)
Sodium: 132 mmol/L — ABNORMAL LOW (ref 135–145)

## 2016-09-18 LAB — GLUCOSE, CAPILLARY
GLUCOSE-CAPILLARY: 133 mg/dL — AB (ref 65–99)
GLUCOSE-CAPILLARY: 155 mg/dL — AB (ref 65–99)
GLUCOSE-CAPILLARY: 164 mg/dL — AB (ref 65–99)
Glucose-Capillary: 169 mg/dL — ABNORMAL HIGH (ref 65–99)
Glucose-Capillary: 169 mg/dL — ABNORMAL HIGH (ref 65–99)
Glucose-Capillary: 222 mg/dL — ABNORMAL HIGH (ref 65–99)

## 2016-09-18 LAB — COOXEMETRY PANEL
Carboxyhemoglobin: 1.6 % — ABNORMAL HIGH (ref 0.5–1.5)
Methemoglobin: 0.9 % (ref 0.0–1.5)
O2 Saturation: 52 %
TOTAL HEMOGLOBIN: 8.6 g/dL — AB (ref 12.0–16.0)

## 2016-09-18 LAB — CBC
HCT: 27.3 % — ABNORMAL LOW (ref 36.0–46.0)
Hemoglobin: 8.4 g/dL — ABNORMAL LOW (ref 12.0–15.0)
MCH: 28.8 pg (ref 26.0–34.0)
MCHC: 30.8 g/dL (ref 30.0–36.0)
MCV: 93.5 fL (ref 78.0–100.0)
Platelets: 413 10*3/uL — ABNORMAL HIGH (ref 150–400)
RBC: 2.92 MIL/uL — ABNORMAL LOW (ref 3.87–5.11)
RDW: 17.1 % — ABNORMAL HIGH (ref 11.5–15.5)
WBC: 17.7 10*3/uL — ABNORMAL HIGH (ref 4.0–10.5)

## 2016-09-18 LAB — PROTIME-INR
INR: 2.84
PROTHROMBIN TIME: 30.4 s — AB (ref 11.4–15.2)

## 2016-09-18 MED ORDER — PRO-STAT SUGAR FREE PO LIQD
30.0000 mL | Freq: Three times a day (TID) | ORAL | Status: DC
  Administered 2016-09-18 – 2016-10-09 (×57): 30 mL
  Filled 2016-09-18 (×61): qty 30

## 2016-09-18 MED ORDER — GUAIFENESIN ER 600 MG PO TB12
1200.0000 mg | ORAL_TABLET | Freq: Two times a day (BID) | ORAL | Status: DC
  Administered 2016-09-18: 1200 mg via ORAL
  Filled 2016-09-18: qty 2

## 2016-09-18 MED ORDER — ALBUTEROL SULFATE (2.5 MG/3ML) 0.083% IN NEBU
2.5000 mg | INHALATION_SOLUTION | RESPIRATORY_TRACT | Status: DC | PRN
  Administered 2016-10-07: 2.5 mg via RESPIRATORY_TRACT
  Filled 2016-09-18 (×2): qty 3

## 2016-09-18 MED ORDER — QUETIAPINE FUMARATE 25 MG PO TABS
25.0000 mg | ORAL_TABLET | Freq: Every day | ORAL | Status: DC
  Administered 2016-09-18 – 2016-09-21 (×4): 25 mg via ORAL
  Filled 2016-09-18 (×4): qty 1

## 2016-09-18 MED ORDER — CHLORHEXIDINE GLUCONATE 0.12 % MT SOLN
15.0000 mL | Freq: Two times a day (BID) | OROMUCOSAL | Status: DC
  Administered 2016-09-18 – 2016-10-10 (×42): 15 mL via OROMUCOSAL
  Filled 2016-09-18 (×40): qty 15

## 2016-09-18 MED ORDER — AMIODARONE HCL 200 MG PO TABS
200.0000 mg | ORAL_TABLET | Freq: Two times a day (BID) | ORAL | Status: DC
  Administered 2016-09-18 – 2016-10-11 (×44): 200 mg via ORAL
  Filled 2016-09-18 (×45): qty 1

## 2016-09-18 MED ORDER — ORAL CARE MOUTH RINSE
15.0000 mL | Freq: Two times a day (BID) | OROMUCOSAL | Status: DC
  Administered 2016-09-19 – 2016-10-09 (×31): 15 mL via OROMUCOSAL

## 2016-09-18 MED ORDER — NEPRO/CARBSTEADY PO LIQD
1000.0000 mL | ORAL | Status: DC
  Administered 2016-09-18 – 2016-09-26 (×7): 1000 mL
  Filled 2016-09-18 (×12): qty 1000

## 2016-09-18 MED ORDER — GUAIFENESIN 100 MG/5ML PO SOLN
30.0000 mL | Freq: Four times a day (QID) | ORAL | Status: DC
  Administered 2016-09-19 – 2016-10-11 (×84): 600 mg
  Filled 2016-09-18 (×3): qty 30
  Filled 2016-09-18: qty 15
  Filled 2016-09-18 (×3): qty 30
  Filled 2016-09-18: qty 15
  Filled 2016-09-18 (×3): qty 30
  Filled 2016-09-18: qty 20
  Filled 2016-09-18: qty 30
  Filled 2016-09-18: qty 15
  Filled 2016-09-18 (×6): qty 30
  Filled 2016-09-18: qty 60
  Filled 2016-09-18: qty 30
  Filled 2016-09-18 (×2): qty 10
  Filled 2016-09-18: qty 30
  Filled 2016-09-18: qty 10
  Filled 2016-09-18 (×2): qty 15
  Filled 2016-09-18: qty 30
  Filled 2016-09-18 (×2): qty 15
  Filled 2016-09-18 (×6): qty 30
  Filled 2016-09-18: qty 15
  Filled 2016-09-18 (×2): qty 30
  Filled 2016-09-18: qty 10
  Filled 2016-09-18 (×2): qty 15
  Filled 2016-09-18: qty 30
  Filled 2016-09-18: qty 15
  Filled 2016-09-18 (×7): qty 30
  Filled 2016-09-18: qty 15
  Filled 2016-09-18 (×2): qty 30
  Filled 2016-09-18: qty 10
  Filled 2016-09-18 (×2): qty 30
  Filled 2016-09-18: qty 20
  Filled 2016-09-18 (×6): qty 30
  Filled 2016-09-18: qty 60
  Filled 2016-09-18: qty 75
  Filled 2016-09-18: qty 15
  Filled 2016-09-18 (×6): qty 30
  Filled 2016-09-18: qty 20
  Filled 2016-09-18: qty 15
  Filled 2016-09-18 (×4): qty 30
  Filled 2016-09-18: qty 15
  Filled 2016-09-18 (×9): qty 30
  Filled 2016-09-18 (×2): qty 10
  Filled 2016-09-18 (×3): qty 30
  Filled 2016-09-18: qty 10

## 2016-09-18 MED ORDER — JEVITY 1.2 CAL PO LIQD: 1000.0000 mL | ORAL | Status: DC

## 2016-09-18 MED ORDER — GLYCOPYRROLATE 1 MG PO TABS
1.0000 mg | ORAL_TABLET | Freq: Two times a day (BID) | ORAL | Status: DC
  Administered 2016-09-18 – 2016-10-11 (×44): 1 mg
  Filled 2016-09-18 (×50): qty 1

## 2016-09-18 NOTE — Progress Notes (Signed)
18 Days Post-Op Procedure(s) (LRB): INSERTION OF DIALYSIS CATHETER LEFT INTERNAL JUGULAR VEIN & INSERTION OF TRIPLE LUMEN RIGHT INTERNAL JUGULAR VEIN (Bilateral) Subjective: Patient stable with good LV function by echo over the weekend She still needs her tracheostomy for clearing heavy secretions White count remains at 17,000-finishing course of imipenem for Enterobacter Seen by speech therapy today-on p.m. valve, still remains nothing by mouth  Objective: Vital signs in last 24 hours: Temp:  [93.3 F (34.1 C)-98.2 F (36.8 C)] 98.1 F (36.7 C) (10/16 1733) Pulse Rate:  [64-84] 84 (10/16 1733) Cardiac Rhythm: Normal sinus rhythm (10/16 0730) Resp:  [19-29] 28 (10/16 1733) BP: (108-159)/(45-129) 156/79 (10/16 1733) SpO2:  [86 %-100 %] 97 % (10/16 1733) FiO2 (%):  [30 %-50 %] 50 % (10/16 1605) Weight:  [147 lb 0.8 oz (66.7 kg)] 147 lb 0.8 oz (66.7 kg) (10/16 0533)  Hemodynamic parameters for last 24 hours:  sinus rhythm  Intake/Output from previous day: 10/15 0701 - 10/16 0700 In: 1876 [I.V.:885.8; NG/GT:780.2] Out: 0  Intake/Output this shift: No intake/output data recorded.  Alert and responsive No murmur Mild edema Lab Results:  Recent Labs  09/17/16 0345 09/18/16 0457  WBC 14.7* 17.7*  HGB 8.2* 8.4*  HCT 26.7* 27.3*  PLT 401* 413*   BMET:  Recent Labs  09/17/16 0350 09/18/16 0457  NA 132* 132*  K 3.8 4.0  CL 95* 95*  CO2 25 23  GLUCOSE 156* 138*  BUN 67* 98*  CREATININE 3.63* 4.39*  CALCIUM 8.5* 9.0    PT/INR:  Recent Labs  09/18/16 0457  LABPROT 30.4*  INR 2.84   ABG    Component Value Date/Time   PHART 7.541 (H) 09/08/2016 0412   HCO3 25.2 09/08/2016 0412   TCO2 26 09/08/2016 0412   ACIDBASEDEF 4.1 (H) 09/02/2016 1850   O2SAT 52.0 09/18/2016 0450   CBG (last 3)   Recent Labs  09/18/16 0342 09/18/16 0835 09/18/16 1238  GLUCAP 155* 133* 169*    Assessment/Plan: S/P Procedure(s) (LRB): INSERTION OF DIALYSIS CATHETER LEFT  INTERNAL JUGULAR VEIN & INSERTION OF TRIPLE LUMEN RIGHT INTERNAL JUGULAR VEIN (Bilateral) Continue current care Patient may need long-term acute care for her tracheostomy and dialysis with continued physical therapy and speech therapy No Coumadin  LOS: 31 days    Tharon Aquas Trigt III 09/18/2016

## 2016-09-18 NOTE — Progress Notes (Signed)
Speech Language Pathology Treatment: Dysphagia;Passy Muir Speaking valve  Patient Details Name: ARMINTA GAMM MRN: [id number] DOB: 1955-12-08 Today's Date: 09/18/2016 Time: 1583-0940 SLP Time Calculation (min) (ACUTE ONLY): 28 min  Assessment / Plan / Recommendation Clinical Impression   Pt was repositioned in bed prior to PMV placement and PO trials. PMV was placed and VS remained stable for 10 minute trial. Pt was able to achieve phonation that was low intensity and hoarse vocal quality, but cough remained weak and nonproductive. She used one-word and simple phrase responses. PO trials of ice chips were given with PMV placed. PMV was removed after 10 minutes of wear given pt's behavior becoming more restless which was likely due to her inability to independently cough to expel secretions. RN present tracheally suctioned and pt relaxed. Recommend pt continue receiving ice chips after oral care and begin to wear PMV intermittently with full staff supervision for 10 minute intervals.   HPI HPI: 60 yo female admitted to [facility] 08/15/16 with STEMI s/p cath to [place]. Developed cardiogenic shock, VDRF (ETT 9/14-9/29), AKI with metabolic acidosis and started on CRRT 08/17/16. Transferred to [facility] to tx shock and evaluated new severe MR from papillary muscle rupture. She also had fever and found to have Group B Strep in sputum culture.Mitral valve replacement 9/20. Swallow evaluation 9/30 with recs for NPO given poor mentation, severely impaired swallow function s/p prolonged intubation. Reintubated 9/30 secondary to septic shock; trach 10/5; new onset right hemiplegia and right gaze preference.  CT negative for acute changes; dx acute encephalopathy.       SLP Plan  Continue with current plan of care     Recommendations  Diet recommendations: NPO;Other(comment) (ice chips following oral care) Medication Administration: Via alternative means Supervision: Full supervision/cueing for compensatory  strategies Postural Changes and/or Swallow Maneuvers: Seated upright 90 degrees      Patient may use Passy-Muir Speech Valve: Intermittently with supervision (10 minute intervals) PMSV Supervision: Full         Oral Care Recommendations: Oral care prior to ice chip/H20 Follow up Recommendations: Skilled Nursing facility Plan: Continue with current plan of care       [place], Student SLP  Shela Leff 09/18/2016, 10:59 AM

## 2016-09-18 NOTE — Progress Notes (Signed)
Physical Therapy Treatment Patient Details Name: Deborah [name] MRN: [id number] DOB: 1956-11-22 Today's Date: 09/18/2016    History of Present Illness 60 yo admitted to [facility] 9/12 with STEMI develped cardiogenic shock with ETT and cRRT initiated on 9/14 after cath, transfer to [facility] 7/51, metabolic acidosis, underwent MVR 9/20, extubated 9/29, reintubated 9/30, 9/25-9/29 hemiplegia developed and resolved, trach and bronchoscopy 10/5. PMHx: DM, HTN, HLD    PT Comments    Pt with improved standing tolerance and ability to rise today. Pt unable to recall sternal precautions and educated for all. Pt encouraged to perform HEP throughout the day. Will continue to follow and recommend CIR.  Pt on 50% FIO2 trach collar today with sats 88-93% during mobility HR 76 BP before 137/82 142/81 after  Follow Up Recommendations  CIR;Supervision/Assistance - 24 hour     Equipment Recommendations       Recommendations for Other Services       Precautions / Restrictions Precautions Precautions: Sternal;Fall Precaution Comments: trach, flexiseal    Mobility  Bed Mobility Overal bed mobility: Needs Assistance Bed Mobility: Rolling;Sidelying to Sit Rolling: Mod assist Sidelying to sit: Mod assist;+2 for safety/equipment       General bed mobility comments: cues for sequence and safety with assist to rotate trunk and rise from surface  Transfers Overall transfer level: Needs assistance   Transfers: Sit to/from Stand;Stand Pivot Transfers Sit to Stand: Mod assist;+2 physical assistance;+2 safety/equipment Stand pivot transfers: Mod assist;+2 safety/equipment;+2 physical assistance       General transfer comment: pt stood x 3 with trials of grossly 15 sec, 40 sec, 10 sec with standing marching on 1st trial. assist to advance RLE with pivot to chair and cues throughout with bil UE support for each trial and assist for anterior translation and rise from surface  Ambulation/Gait                  Stairs            Wheelchair Mobility    Modified Rankin (Stroke Patients Only)       Balance Overall balance assessment: Needs assistance   Sitting balance-Leahy Scale: Fair Sitting balance - Comments: pt sat EOB 3 min with minguard assist     Standing balance-Leahy Scale: Poor                      Cognition Arousal/Alertness: Awake/alert Behavior During Therapy: Anxious Overall Cognitive Status: Impaired/Different from baseline Area of Impairment: Attention;Following commands;Safety/judgement;Problem solving;Memory   Current Attention Level: Sustained Memory: Decreased recall of precautions;Decreased short-term memory Following Commands: Follows one step commands inconsistently       General Comments: pt educated for sternal precautions    Exercises General Exercises - Lower Extremity Long Arc Quad: AROM;Both;Seated;15 reps Hip Flexion/Marching: AAROM;Both;Seated;15 reps    General Comments        Pertinent Vitals/Pain Pain Assessment: No/denies pain Faces Pain Scale: No hurt    Home Living                      Prior Function            PT Goals (current goals can now be found in the care plan section) Progress towards PT goals: Progressing toward goals    Frequency           PT Plan Current plan remains appropriate    Co-evaluation PT/OT/SLP Co-Evaluation/Treatment: Yes Reason for Co-Treatment: Complexity of the patient's impairments (multi-system involvement);For  patient/therapist safety PT goals addressed during session: Mobility/safety with mobility;Strengthening/ROM       End of Session Equipment Utilized During Treatment: Gait belt;Oxygen Activity Tolerance: Patient tolerated treatment well Patient left: in chair;with call bell/phone within reach;with chair alarm set     Time: 4388-8757 PT Time Calculation (min) (ACUTE ONLY): 29 min  Charges:  $Therapeutic Activity: 8-22 mins                     G Codes:      [name] September 29, 2016, 11:50 AM Elwyn Reach, [place]

## 2016-09-18 NOTE — Procedures (Signed)
I have seen and examined this patient and agree with the plan of care   Acute kidney Injury  S/p cath  MVR and CABG x 1     Appears to be doing well on dialysis   No urine output     Deborah [name] W 09/18/2016, 3:55 PM

## 2016-09-18 NOTE — Care Management (Signed)
CM requested Inpt Rehab consult 09/12/16 via bedside nurse and physician sticky tab  CM again requested Inpt Rehab consult 09/18/16 via bedside nurse (physician sticky note still visible from 09/12/16);pt is now off pressors and on IHD

## 2016-09-18 NOTE — Progress Notes (Signed)
Nutrition Follow Up  DOCUMENTATION CODES:   Not applicable  INTERVENTION:    Increase Nepro formula to goal rate of 35 ml/hr    Decrease Prostat liquid protein to 30 ml TID  Total TF regimen to provide 1812 kcals, 113 gm protein, 585 ml of free water  NUTRITION DIAGNOSIS:   Inadequate oral intake related to inability to eat as evidenced by NPO status; ongoing  GOAL:   Patient will meet greater than or equal to 90% of their needs; met  MONITOR:   TF tolerance, Labs, Weight trends, Skin, I & O's  ASSESSMENT:   60 y/o woman with DM2, HTN, HL and noncompliance presented to [facility] ER with infrerior posterior STEMI on 9/12. Taken to cath lab and found to have occluded small to moderate OM-1 branch. Underwent successful PCS with stent x 2 by Dr. Clayborn Bigness. Coronaries otherwise ok. EF 55-60% with severe MR confirmed by echo.    Pt s/p procedures 9/20: MITRAL VALVE REPAIR (MVR)  CORONARY ARTERY BYPASS GRAFTING (CABG)    Pt now on trach collar. Acute on CKD 3 >> Nephrology following >> for HD next week. Spoke with RN who just placed new CORTRAK small bore feeding tube (tip in duodenum). Speech Path following for dysphagia & PMSV. Labs and medications reviewed.  CBG (last 3)   Recent Labs  09/18/16 0008 09/18/16 0342 09/18/16 0835  GLUCAP 222* 155* 133*   Diet Order:  Diet NPO time specified  Skin:  Reviewed, no issues  Last BM:  10/15  Height:   Ht Readings from Last 1 Encounters:  09/18/16 5' 1"  (1.549 m)    Weight:   Wt Readings from Last 1 Encounters:  09/18/16 147 lb 0.8 oz (66.7 kg)  Admit weight (08/18/16) 156 lb (71.2 kg)  Ideal Body Weight:  47.7 kg  BMI:  Body mass index is 27.78 kg/m.  Estimated Nutritional Needs:   Kcal:  1700-1900  Protein:  100-110 gm  Fluid:  per MD  EDUCATION NEEDS:   No education needs identified at this time  Arthur Holms, RD, LDN Pager #: [phone] After-Hours Pager #: [phone]

## 2016-09-18 NOTE — Progress Notes (Signed)
PULMONARY / CRITICAL CARE MEDICINE   Name: Deborah [name] MRN: [id number] DOB: 02-15-1956    ADMISSION DATE:  08/18/2016 CONSULTATION DATE:  08/18/2016  REFERRING MD: Bensimhon  CHIEF COMPLAINT: Chest pain  SUBJECTIVE:  Denies chest/abd pain.  Still has thick secretions.    VITAL SIGNS: BP (!) 156/93   Pulse 77   Temp 98.2 F (36.8 C) (Oral)   Resp (!) 23   Ht 5\' 1"  (1.549 m)   Wt 147 lb 0.8 oz (66.7 kg)   SpO2 96%   BMI 27.78 kg/m   INTAKE / OUTPUT: I/O last 3 completed shifts: In: 2846.4 [I.V.:1246.2; Other:210; NG/GT:1290.2; IV Piggyback:100] Out: 300 [Stool:300]  PHYSICAL EXAMINATION: General:  alert Neuro:  Normal strength HEENT:  Trach site clean Cardiovascular:  Regular, 2/6 murmur Lungs:  No wheeze Abdomen:  Soft, non tender Musculoskeletal:  1+ edema Skin:  No rashes  LABS: CMP Latest Ref Rng & Units 09/18/2016 09/17/2016 09/16/2016  Glucose 65 - 99 mg/dL 138(H) 156(H) 88  BUN 6 - 20 mg/dL 98(H) 67(H) 42(H)  Creatinine 0.44 - 1.00 mg/dL 4.39(H) 3.63(H) 2.39(H)  Sodium 135 - 145 mmol/L 132(L) 132(L) 135  Potassium 3.5 - 5.1 mmol/L 4.0 3.8 3.9  Chloride 101 - 111 mmol/L 95(L) 95(L) 97(L)  CO2 22 - 32 mmol/L 23 25 28   Calcium 8.9 - 10.3 mg/dL 9.0 8.5(L) 8.5(L)  Total Protein 6.5 - 8.1 g/dL - - -  Total Bilirubin 0.3 - 1.2 mg/dL - - -  Alkaline Phos 38 - 126 U/L - - -  AST 15 - 41 U/L - - -  ALT 14 - 54 U/L - - -    CBC Latest Ref Rng & Units 09/18/2016 09/17/2016 09/16/2016  WBC 4.0 - 10.5 K/uL 17.7(H) 14.7(H) 14.0(H)  Hemoglobin 12.0 - 15.0 g/dL 8.4(L) 8.2(L) 8.2(L)  Hematocrit 36.0 - 46.0 % 27.3(L) 26.7(L) 26.8(L)  Platelets 150 - 400 K/uL 413(H) 401(H) 411(H)    Glucose  Recent Labs Lab 09/17/16 1156 09/17/16 1632 09/17/16 2036 09/18/16 0008 09/18/16 0342 09/18/16 0835  GLUCAP 198* 184* 220* 222* 155* 133*    Imaging Dg Abd 1 View  Result Date: 09/18/2016 CLINICAL DATA:  Feeding tube placement. EXAM: ABDOMEN - 1 VIEW COMPARISON:   09/16/2016 . FINDINGS: Feeding tube noted with tip projected over the third portion of the duodenum. Distended loops of small bowel and large bowel are noted. No free air. Prior cardiac valve replacement. Bibasilar atelectasis and bilateral pleural effusions noted . IMPRESSION: 1. Feeding tube noted with tip projected third portion the duodenum. Distended loops of small and large bowel are noted. No free air. 2. Prior cardiac valve replacement. Bibasilar atelectasis and bilateral pleural effusions. Electronically Signed   By: Marcello Moores  Register   On: 09/18/2016 09:16   Dg Chest Port 1 View  Result Date: 09/18/2016 CLINICAL DATA:  Tracheostomy EXAM: PORTABLE CHEST 1 VIEW COMPARISON:  09/16/2016 FINDINGS: Cardiomegaly. Status post median sternotomy and cardiac valve replacement. NG tube in place. Stable left IJ catheter with tip in right atrium. Central vascular congestion and mild perihilar interstitial prominence highly suspicious for pulmonary edema. Bilateral small pleural effusion with bilateral basilar atelectasis or infiltrate. IMPRESSION: Status post median sternotomy and cardiac valve replacement. NG tube in place. Stable left IJ catheter with tip in right atrium. Central vascular congestion and mild perihilar interstitial prominence highly suspicious for pulmonary edema. Bilateral small pleural effusion with bilateral basilar atelectasis or infiltrate. Electronically Signed   By: Orlean Bradford.D.  On: 09/18/2016 08:03    STUDIES:  LHC 9/12 >> PCI to OM1, severe MR, EF 55 to 60% RHC 9/15 >> RA 5, RV 37/3/6, PA 38/18/26, PCWP 15, CI 1.9, PVR 3.3 WU TEE 9/15 >> EF 60 to 65%, flail motion MV with severe MR TTE 10/15 >> EF 60 to 65%, mild LVH  CULTURES: Sputum 9/15 >> Group B Strep, Staph aureus Blood 9/24 >> coag negative Staph Sputum 10/01 >> Enterobacter Sputum 10/12 >> Few Candida albicans  SIGNIFICANT EVENTS: 09/12 Admit to [facility] 09/15 To [facility] 09/20 MVR with bioprosthetic valve 09/28  Permcath for HD 09/29 Extubated 09/30 Reintubated 10/05 Trach by Hyman Bible 10/09 Off vent 10/13 Changed to #4 cuffless trach  DISCUSSION: 60 yo female from [facility] 9/12 with STEMI s/p cath to [place].  Developed cardiogenic shock, CAP with Group B Strep, AKI, HCAP with Enterobacter.  Failed to wean and required trach.  ASSESSMENT / PLAN:  Acute hypoxic respiratory failure 2nd to CAP, STEMI. Failure to wean s/p tracheostomy. CAP with Group B strep >> completed Abx 9/23. HCAP with Enterobacter >> completed Abx 10/14. 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 A fib. - per cardiology and TCTS  AKI now with stage 5 CKD. - HD per nephrology  Agitated delirium >> improved. - decrease seroquel to 25 mg qhs  PCCM will f/u intermittently for trach management >> call if help needed in between.  Chesley Mires, MD [facility] Pulmonary/Critical Care 09/18/2016, 10:01 AM Pager:  [phone] After 3pm call: [phone]

## 2016-09-18 NOTE — Progress Notes (Signed)
ANTICOAGULATION CONSULT NOTE - Follow Up Consult  Pharmacy Consult for Coumadin Indication: Aflutter  Assessment: Anticoag: new aflutter, Added coumadin 10/11,  d/c hep 10/13 due large jump in INR. INR trending down 3.5>>2.8 Hgb 8s stable.  - INR goal 2-2.5 per Dr. Prescott Gum.  Goal of Therapy:  INR 2-2.5 Monitor platelets by anticoagulation protocol: Yes   Plan:  1) Hold coumadin again tonight  2) Daily INR, CBC   No Known Allergies  Patient Measurements: Height: 5\' 1"  (154.9 cm) Weight: 147 lb 0.8 oz (66.7 kg) IBW/kg (Calculated) : 47.8  Vital Signs: Temp: 97.7 F (36.5 C) (10/16 0658) Temp Source: Oral (10/16 0658) BP: 130/77 (10/16 0700) Pulse Rate: 68 (10/16 0700)  Labs:  Recent Labs  09/16/16 0417 09/17/16 0345 09/17/16 0350 09/18/16 0457  HGB 8.2* 8.2*  --  8.4*  HCT 26.8* 26.7*  --  27.3*  PLT 411* 401*  --  413*  LABPROT 33.1* 36.2*  --  30.4*  INR 3.15 3.53  --  2.84  CREATININE 2.39*  --  3.63* 4.39*    Estimated Creatinine Clearance: 12.1 mL/min (by C-G formula based on SCr of 4.39 mg/dL (H)).   Erin Hearing PharmD., BCPS Clinical Pharmacist Pager [phone] 09/18/2016 10:28 AM

## 2016-09-18 NOTE — Progress Notes (Signed)
Patient ID: Deborah [name], female   DOB: 29-Nov-1956, 60 y.o.   MRN: [id number]   ADVANCED HF TEAM  60 y/o woman with DM2, HTN, HL and noncompliance presented to [facility] ER with infrerior posterior STEMI on 9/12. Taken to cath lab and found to have occluded small to moderate OM-1 branch. Underwent successful PCS with stent x 2 by Dr. Clayborn Bigness. Coronaries otherwise ok. EF 55-60% with severe MR confirmed by echo.   Post-cath developed respiratory failure and required intubation. Developed shock and renal failure with peak creatinine 3.4 (1.6 on admit). Transferred to [facility] for furthe management on 9/15.Marland Kitchen TEE showed normal LV (EF 60-65%) and normal RV function with ruptured posterior papillary muscle with severe posterior MR.  Underwent placement of Swan and IABP. S/P MVR 08/23/16.   Treated for group B Strep PNA.   Echo 9/23  RV and LV are normal EF 60%  Extubated 9/29.  However, picture consistent with septic shock on 9/30 so pressors started and she was re-intubated. S/P Trach 10/5.  Developed AFL S/P TEE DC-CV 09/04/16   Milrinone stopped 09/10/16, Coox fell to 30% -> 47.4% -> 60.1 with resumption of milrinone. Switched to dobutamine.  Yesterday dobutamine stopped. Todays CO-OX 52%. Converted to NSR.  Started on coumadin 09/13/16. INR goal 2-2.5 per Dr. Prescott Gum.  She is now getting intermittent HD.   Echo 09/17/16: Normal LV size with mild LV hypertrophy. EF 60-65%. Normal RV   size with mildly decreased systolic function. No evidence for   pulmonary hypertension. The interventricular septum is shifted   mildly to the left but there is no evidence for marked RV   abnormality. The bioprosthetic mitral valve appears to function   normally.  Scheduled Meds: . atorvastatin  20 mg Oral q1800  . chlorhexidine gluconate (MEDLINE KIT)  15 mL Mouth Rinse BID  . clopidogrel  75 mg Oral Daily  . darbepoetin (ARANESP) injection - NON-DIALYSIS  100 mcg Subcutaneous Q Mon-1800  . famotidine  20 mg  Oral Daily  . feeding supplement (NEPRO CARB STEADY)  1,000 mL Per Tube Q24H  . feeding supplement (PRO-STAT SUGAR FREE 64)  60 mL Per Tube TID  . Gerhardt's butt cream   Topical BID  . insulin aspart  0-24 Units Subcutaneous Q4H  . insulin detemir  10 Units Subcutaneous BID  . mouth rinse  15 mL Mouth Rinse 10 times per day  . midodrine  10 mg Oral TID WC  . multivitamin  15 mL Per Tube Daily  . QUEtiapine  50 mg Oral QHS  . Warfarin - Pharmacist Dosing Inpatient   Does not apply q1800   Continuous Infusions: . sodium chloride    . sodium chloride 20 mL/hr at 09/18/16 0700  . amiodarone 30 mg/hr (09/18/16 0700)   PRN Meds:.Place/Maintain arterial line **AND** sodium chloride, fentaNYL (SUBLIMAZE) injection, HYDROcodone-acetaminophen, levalbuterol, ondansetron (ZOFRAN) IV, sodium chloride flush    Vitals:   09/18/16 0533 09/18/16 0600 09/18/16 0658 09/18/16 0700  BP:  134/78  130/77  Pulse: 66 69 71 68  Resp: (!) 29 (!) 21 19 (!) 21  Temp:   97.7 F (36.5 C)   TempSrc:   Oral   SpO2: 93% 96% 97% 100%  Weight: 147 lb 0.8 oz (66.7 kg)     Height: _0  (1.549 m)       Intake/Output Summary (Last 24 hours) at 09/18/16 0809 Last data filed at 09/18/16 0800  Gross per 24 hour  Intake  1785.97 ml  Output                0 ml  Net          1785.97 ml    LABS: Basic Metabolic Panel:  Recent Labs  09/17/16 0350 09/18/16 0457  NA 132* 132*  K 3.8 4.0  CL 95* 95*  CO2 25 23  GLUCOSE 156* 138*  BUN 67* 98*  CREATININE 3.63* 4.39*  CALCIUM 8.5* 9.0  PHOS 4.7* 5.9*   Liver Function Tests:  Recent Labs  09/17/16 0350 09/18/16 0457  ALBUMIN 2.3* 2.5*   No results for input(s): LIPASE, AMYLASE in the last 72 hours. CBC:  Recent Labs  09/17/16 0345 09/18/16 0457  WBC 14.7* 17.7*  HGB 8.2* 8.4*  HCT 26.7* 27.3*  MCV 95.0 93.5  PLT 401* 413*   Cardiac Enzymes: No results for input(s): CKTOTAL, CKMB, CKMBINDEX, TROPONINI in the last 72  hours. BNP: Invalid input(s): POCBNP D-Dimer: No results for input(s): DDIMER in the last 72 hours. Hemoglobin A1C: No results for input(s): HGBA1C in the last 72 hours. Fasting Lipid Panel: No results for input(s): CHOL, HDL, LDLCALC, TRIG, CHOLHDL, LDLDIRECT in the last 72 hours. Thyroid Function Tests: No results for input(s): TSH, T4TOTAL, T3FREE, THYROIDAB in the last 72 hours.  Invalid input(s): FREET3 Anemia Panel: No results for input(s): VITAMINB12, FOLATE, FERRITIN, TIBC, IRON, RETICCTPCT in the last 72 hours.  PHYSICAL EXAM General: lying in bed, NAD Neck: Trach, JVP difficult Lungs: Rhonchi throughout. CV: Nondisplaced PMI.  Heart regular S1/S2, no S3/S4, 1/6 SEM USB.   Abdomen: Soft, NT, ND, no HSM. No bruits or masses. +BS  Neurologic: MAE. Follows commands.   Extremities: No clubbing or cyanosis. R and LLE SCDs.   TELEMETRY: NSR 70s     Assessment:   1. Cardiogenic shock: Due to acute severe MR. EF 70% on 9/25 echo.  2. CAD: Inferior posterior STEMI on 9/13 with DES x 2 to OM-1      --Plavix restarted on 9/22 3. 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     --s/p MVR 08/23/2016  4. AKI on CKD stage III - due to ATN likely from cardiogenic shock + contrast with cath. Has required CVVH.  5. Acute respiratory failure: Pulmonary edema.  6. DM2 7. Suspect component of septic shock: Possible PNA, group B Strep in sputum culture.  8. Anemia 9. PVCs   10. Shock liver 11. MRSA bacteremia 12. Atrial fibrillation: Paroxysmal.  On amiodarone.    Plan/Discussion:    Stable s/p trach now on trach collar.    She is anticoagulated. Converted to NSR. Stop amio drip. Start amio 200 mg twice a day.    Coox 52% off dobutamine. EF showed normal LV systolic function and probably only mildly depressed RV systolic function.  No evidence for pulmonary hypertension and bioprosthetic mitral valve  functioning normally.   Remains on Plavix with recent PCI.   Ongoing intermittent HD, next planned for today. SBP trending up. Nephrology dosing midodrine.   Amy Clegg NP-C  09/18/2016 8:09 AM  Patient seen and examined with Darrick Grinder, NP. We discussed all aspects of the encounter. I agree with the assessment and plan as stated above.   Now off dobutamine. Co-ox marginal. Will leave off for now. Repeat co-ox in am. ECG with NSR with 1AVB. Switch amio to po. INR now 2.8. Adjust warfarin per pharmacy. Continue Plavix.  No ASA.  Continue rehab,   Nephrology following for HD and are dosing midodrine,   Bensimhon, Daniel,MD 9:05 AM

## 2016-09-18 NOTE — Progress Notes (Signed)
At 0630 Pt monitor  alarmed with 02 sats 86%.  Pt had removed trach collar and pulled out cortrac tube. Placed back on 40% trach collar with sats increased to 97%.  Will have cortrac replace this AM.  Bilateral mittens applied.

## 2016-09-18 NOTE — Progress Notes (Signed)
      [address]       [place] [address]             [phone]      Back from HD Feeding tube replaced no new issues today  Remo Lipps C. Roxan Hockey, MD [facility] [phone]

## 2016-09-18 NOTE — Significant Event (Signed)
Cortrak feeding tube replaced using existing that was removed by patient this morning at 0630. RN inserted via left nare, mark at 85cm at the nare. Patient tolerated procedure. KUB ordered. Pending confirmation.

## 2016-09-19 ENCOUNTER — Inpatient Hospital Stay (HOSPITAL_COMMUNITY): Payer: Medicaid Other

## 2016-09-19 LAB — GLUCOSE, CAPILLARY
GLUCOSE-CAPILLARY: 154 mg/dL — AB (ref 65–99)
GLUCOSE-CAPILLARY: 188 mg/dL — AB (ref 65–99)
GLUCOSE-CAPILLARY: 114 mg/dL — AB (ref 65–99)
Glucose-Capillary: 148 mg/dL — ABNORMAL HIGH (ref 65–99)
Glucose-Capillary: 270 mg/dL — ABNORMAL HIGH (ref 65–99)

## 2016-09-19 LAB — RENAL FUNCTION PANEL
ALBUMIN: 2.2 g/dL — AB (ref 3.5–5.0)
ANION GAP: 8 (ref 5–15)
BUN: 44 mg/dL — ABNORMAL HIGH (ref 6–20)
CO2: 28 mmol/L (ref 22–32)
Calcium: 8 mg/dL — ABNORMAL LOW (ref 8.9–10.3)
Chloride: 98 mmol/L — ABNORMAL LOW (ref 101–111)
Creatinine, Ser: 2.6 mg/dL — ABNORMAL HIGH (ref 0.44–1.00)
GFR calc non Af Amer: 19 mL/min — ABNORMAL LOW (ref >60)
GFR, EST AFRICAN AMERICAN: 22 mL/min — AB (ref >60)
GLUCOSE: 160 mg/dL — AB (ref 65–99)
PHOSPHORUS: 3 mg/dL (ref 2.5–4.6)
POTASSIUM: 3.2 mmol/L — AB (ref 3.5–5.1)
Sodium: 134 mmol/L — ABNORMAL LOW (ref 135–145)

## 2016-09-19 LAB — CBC
HCT: 26.1 % — ABNORMAL LOW (ref 36.0–46.0)
Hemoglobin: 8.1 g/dL — ABNORMAL LOW (ref 12.0–15.0)
MCH: 29.1 pg (ref 26.0–34.0)
MCHC: 31 g/dL (ref 30.0–36.0)
MCV: 93.9 fL (ref 78.0–100.0)
Platelets: 380 10*3/uL (ref 150–400)
RBC: 2.78 MIL/uL — ABNORMAL LOW (ref 3.87–5.11)
RDW: 17.4 % — ABNORMAL HIGH (ref 11.5–15.5)
WBC: 18.6 10*3/uL — ABNORMAL HIGH (ref 4.0–10.5)

## 2016-09-19 LAB — COOXEMETRY PANEL
CARBOXYHEMOGLOBIN: 1.8 % — AB (ref 0.5–1.5)
METHEMOGLOBIN: 0.6 % (ref 0.0–1.5)
O2 SAT: 59.4 %
Total hemoglobin: 7.9 g/dL — ABNORMAL LOW (ref 12.0–16.0)

## 2016-09-19 LAB — PROTIME-INR
INR: 2.71
Prothrombin Time: 29.3 seconds — ABNORMAL HIGH (ref 11.4–15.2)

## 2016-09-19 MED ORDER — POTASSIUM CHLORIDE 10 MEQ/50ML IV SOLN
10.0000 meq | INTRAVENOUS | Status: AC
  Administered 2016-09-19 (×3): 10 meq via INTRAVENOUS
  Filled 2016-09-19: qty 50

## 2016-09-19 NOTE — Care Management (Addendum)
   CM requested Inpt Rehab consult 09/12/16 via bedside nurse and physician sticky tab  CM again requested Inpt Rehab consult 09/18/16 via bedside nurse (physician sticky note still visible from 09/12/16);pt is now off pressors and on IHD  CM text paged Dr Prescott Gum - OR nurse called CM back and per Dr Prescott Gum - CM to place order for Inpt Rehab Consult.  CM communicated that per physician advisor pt is not appropriate for LTACH, pt is self pay

## 2016-09-19 NOTE — Progress Notes (Signed)
SLP Cancellation Note  Patient Details Name: Deborah [name] MRN: [id number] DOB: [birth year]   Cancelled treatment:       Reason Eval/Treat Not Completed: Patient at procedure or test/unavailable  Ezekiel Slocumb, Student SLP  Shela Leff 09/19/2016, 1:39 PM

## 2016-09-19 NOTE — Progress Notes (Signed)
Pt transferred to Hemodialysis unit via bed, on telemetry and on 45% Trach Collar, accompanied by 2 transporters and RN. Pt's VSS and tolerated tx well. Pt will be returned to [facility] following HD treatment.

## 2016-09-19 NOTE — Progress Notes (Signed)
[facility] ROUNDING NOTE   Subjective:   Interval History: no change still aneuric and suctioning thick secretions  Objective:  Vital signs in last 24 hours:  Temp:  [97.3 F (36.3 C)-98.1 F (36.7 C)] 97.3 F (36.3 C) (10/17 0833) Pulse Rate:  [55-99] 55 (10/17 0700) Resp:  [16-28] 26 (10/17 0700) BP: (90-174)/(45-108) 174/84 (10/17 0843) SpO2:  [90 %-100 %] 96 % (10/17 0843) FiO2 (%):  [40 %-50 %] 40 % (10/17 0843) Weight:  [68.1 kg (150 lb 2.1 oz)] 68.1 kg (150 lb 2.1 oz) (10/17 0345)  Weight change: 1.4 kg (3 lb 1.4 oz) Filed Weights   09/17/16 0600 09/18/16 0533 09/19/16 0345  Weight: 65.7 kg (144 lb 13.5 oz) 66.7 kg (147 lb 0.8 oz) 68.1 kg (150 lb 2.1 oz)    Intake/Output: I/O last 3 completed shifts: In: 2104.9 [I.V.:550.9; Other:40; IA/XK:5537; IV Piggyback:100] Out: 2250 [Other:2000; Stool:250]   Intake/Output this shift:  No intake/output data recorded.  General:  alert Neuro:  Normal strength HEENT:  Trach site clean Cardiovascular:  Regular, 2/6 murmur Lungs:  No wheeze Abdomen:  Soft, non tender Musculoskeletal:  1+ edema Skin:  No rashes   Basic Metabolic Panel:  Recent Labs Lab 09/14/16 0350 09/15/16 0727 09/16/16 0417 09/17/16 0350 09/18/16 0457 09/19/16 0345  NA 136 134* 135 132* 132* 134*  K 3.9 4.4 3.9 3.8 4.0 3.2*  CL 101 97* 97* 95* 95* 98*  CO2 26 26 28 25 23 28   GLUCOSE 189* 113* 88 156* 138* 160*  BUN 29* 55* 42* 67* 98* 44*  CREATININE 2.07* 3.37* 2.39* 3.63* 4.39* 2.60*  CALCIUM 8.3* 8.7* 8.5* 8.5* 9.0 8.0*  PHOS 2.0*  --  3.3 4.7* 5.9* 3.0    Liver Function Tests:  Recent Labs Lab 09/13/16 0422 09/14/16 0350 09/16/16 0417 09/17/16 0350 09/18/16 0457 09/19/16 0345  AST 21  --   --   --   --   --   ALT 27  --   --   --   --   --   ALKPHOS 111  --   --   --   --   --   BILITOT 1.3*  --   --   --   --   --   PROT 6.4*  --   --   --   --   --   ALBUMIN 2.4* 2.5* 2.4* 2.3* 2.5* 2.2*   No results for  input(s): LIPASE, AMYLASE in the last 168 hours. No results for input(s): AMMONIA in the last 168 hours.  CBC:  Recent Labs Lab 09/15/16 0345 09/16/16 0417 09/17/16 0345 09/18/16 0457 09/19/16 0345  WBC 17.3* 14.0* 14.7* 17.7* 18.6*  HGB 8.2* 8.2* 8.2* 8.4* 8.1*  HCT 26.4* 26.8* 26.7* 27.3* 26.1*  MCV 94.6 94.4 95.0 93.5 93.9  PLT 394 411* 401* 413* 380    Cardiac Enzymes: No results for input(s): CKTOTAL, CKMB, CKMBINDEX, TROPONINI in the last 168 hours.  BNP: Invalid input(s): POCBNP  CBG:  Recent Labs Lab 09/18/16 1238 09/18/16 2011 09/18/16 2357 09/19/16 0433 09/19/16 0809  GLUCAP 169* 169* 164* 154* 148*    Microbiology: Results for orders placed or performed during the hospital encounter of 08/18/16  Surgical pcr screen     Status: None   Collection Time: 08/18/16  2:54 PM  Result Value Ref Range Status   MRSA, PCR NEGATIVE NEGATIVE Final   Staphylococcus aureus NEGATIVE NEGATIVE Final    Comment:  The Xpert SA Assay (FDA approved for NASAL specimens in patients over 10 years of age), is one component of a comprehensive surveillance program.  Test performance has been validated by [facility] for patients greater than or equal to 81 year old. It is not intended to diagnose infection nor to guide or monitor treatment.   Culture, respiratory (NON-Expectorated)     Status: None   Collection Time: 08/18/16  6:04 PM  Result Value Ref Range Status   Specimen Description TRACHEAL ASPIRATE  Final   Special Requests Normal  Final   Gram Stain   Final    MODERATE WBC PRESENT,BOTH PMN AND MONONUCLEAR ABUNDANT GRAM POSITIVE COCCI IN PAIRS AND CHAINS RARE YEAST    Culture   Final    MODERATE STREPTOCOCCUS AGALACTIAE TESTING AGAINST S. AGALACTIAE NOT ROUTINELY PERFORMED DUE TO PREDICTABILITY OF AMP/PEN/VAN SUSCEPTIBILITY. FEW STAPHYLOCOCCUS AUREUS    Report Status 08/22/2016 FINAL  Final   Organism ID, Bacteria STAPHYLOCOCCUS AUREUS  Final       Susceptibility   Staphylococcus aureus - MIC*    CIPROFLOXACIN <=0.5 SENSITIVE Sensitive     ERYTHROMYCIN 0.5 SENSITIVE Sensitive     GENTAMICIN <=0.5 SENSITIVE Sensitive     OXACILLIN <=0.25 SENSITIVE Sensitive     TETRACYCLINE <=1 SENSITIVE Sensitive     VANCOMYCIN 1 SENSITIVE Sensitive     TRIMETH/SULFA <=10 SENSITIVE Sensitive     CLINDAMYCIN <=0.25 SENSITIVE Sensitive     RIFAMPIN <=0.5 SENSITIVE Sensitive     Inducible Clindamycin NEGATIVE Sensitive     * FEW STAPHYLOCOCCUS AUREUS  Culture, blood (Routine X 2) w Reflex to ID Panel     Status: None   Collection Time: 08/18/16  6:50 PM  Result Value Ref Range Status   Specimen Description BLOOD RIGHT ANTECUBITAL  Final   Special Requests BOTTLES DRAWN AEROBIC AND ANAEROBIC 5CC  Final   Culture NO GROWTH 5 DAYS  Final   Report Status 08/23/2016 FINAL  Final  Culture, blood (Routine X 2) w Reflex to ID Panel     Status: None   Collection Time: 08/18/16  7:00 PM  Result Value Ref Range Status   Specimen Description BLOOD RIGHT HAND  Final   Special Requests BOTTLES DRAWN AEROBIC AND ANAEROBIC 3CC  Final   Culture NO GROWTH 5 DAYS  Final   Report Status 08/23/2016 FINAL  Final  Surgical pcr screen     Status: Abnormal   Collection Time: 08/22/16  8:32 PM  Result Value Ref Range Status   MRSA, PCR NEGATIVE NEGATIVE Final   Staphylococcus aureus POSITIVE (A) NEGATIVE Final    Comment:        The Xpert SA Assay (FDA approved for NASAL specimens in patients over 47 years of age), is one component of a comprehensive surveillance program.  Test performance has been validated by [facility] for patients greater than or equal to 25 year old. It is not intended to diagnose infection nor to guide or monitor treatment.   Culture, blood (routine x 2)     Status: Abnormal   Collection Time: 08/27/16  9:00 AM  Result Value Ref Range Status   Specimen Description BLOOD LEFT HAND  Final   Special Requests IN PEDIATRIC BOTTLE 3CC   Final   Culture  Setup Time   Final    GRAM POSITIVE COCCI IN CLUSTERS IN PEDIATRIC BOTTLE Organism ID to follow CRITICAL RESULT CALLED TO, READ BACK BY AND VERIFIED WITH: A. Pablo Lawrence.D. 16:15 08/29/16 ([email or web address])  Culture (A)  Final    STAPHYLOCOCCUS SPECIES (COAGULASE NEGATIVE) THE SIGNIFICANCE OF ISOLATING THIS ORGANISM FROM A SINGLE SET OF BLOOD CULTURES WHEN MULTIPLE SETS ARE DRAWN IS UNCERTAIN. PLEASE NOTIFY THE MICROBIOLOGY DEPARTMENT WITHIN ONE WEEK IF SPECIATION AND SENSITIVITIES ARE REQUIRED.    Report Status 08/30/2016 FINAL  Final  Blood Culture ID Panel (Reflexed)     Status: Abnormal   Collection Time: 08/27/16  9:00 AM  Result Value Ref Range Status   Enterococcus species NOT DETECTED NOT DETECTED Final   Listeria monocytogenes NOT DETECTED NOT DETECTED Final   Staphylococcus species DETECTED (A) NOT DETECTED Final    Comment: CRITICAL RESULT CALLED TO, READ BACK BY AND VERIFIED WITH: A. Pablo Lawrence.D. 16:15 08/29/16 ([email or web address])    Staphylococcus aureus NOT DETECTED NOT DETECTED Final   Methicillin resistance DETECTED (A) NOT DETECTED Final    Comment: CRITICAL RESULT CALLED TO, READ BACK BY AND VERIFIED WITH: A. Pablo Lawrence.D. 16:15 08/29/16 ([email or web address])    Streptococcus species NOT DETECTED NOT DETECTED Final   Streptococcus agalactiae NOT DETECTED NOT DETECTED Final   Streptococcus pneumoniae NOT DETECTED NOT DETECTED Final   Streptococcus pyogenes NOT DETECTED NOT DETECTED Final   Acinetobacter baumannii NOT DETECTED NOT DETECTED Final   Enterobacteriaceae species NOT DETECTED NOT DETECTED Final   Enterobacter cloacae complex NOT DETECTED NOT DETECTED Final   Escherichia coli NOT DETECTED NOT DETECTED Final   Klebsiella oxytoca NOT DETECTED NOT DETECTED Final   Klebsiella pneumoniae NOT DETECTED NOT DETECTED Final   Proteus species NOT DETECTED NOT DETECTED Final   Serratia marcescens NOT DETECTED NOT DETECTED Final   Haemophilus influenzae NOT DETECTED NOT  DETECTED Final   Neisseria meningitidis NOT DETECTED NOT DETECTED Final   Pseudomonas aeruginosa NOT DETECTED NOT DETECTED Final   Candida albicans NOT DETECTED NOT DETECTED Final   Candida glabrata NOT DETECTED NOT DETECTED Final   Candida krusei NOT DETECTED NOT DETECTED Final   Candida parapsilosis NOT DETECTED NOT DETECTED Final   Candida tropicalis NOT DETECTED NOT DETECTED Final  Culture, blood (routine x 2)     Status: None   Collection Time: 08/27/16  9:19 AM  Result Value Ref Range Status   Specimen Description BLOOD RIGHT ASSIST CONTROL  Final   Special Requests BOTTLES DRAWN AEROBIC ONLY 4CC  Final   Culture NO GROWTH 5 DAYS  Final   Report Status 09/01/2016 FINAL  Final  C difficile quick scan w PCR reflex     Status: None   Collection Time: 09/01/16  8:20 AM  Result Value Ref Range Status   C Diff antigen NEGATIVE NEGATIVE Final   C Diff toxin NEGATIVE NEGATIVE Final   C Diff interpretation No C. difficile detected.  Final  Culture, blood (Routine X 2) w Reflex to ID Panel     Status: None   Collection Time: 09/02/16 10:20 AM  Result Value Ref Range Status   Specimen Description BLOOD RIGHT ANTECUBITAL  Final   Special Requests BOTTLES DRAWN AEROBIC ONLY 5CC  Final   Culture NO GROWTH 5 DAYS  Final   Report Status 09/07/2016 FINAL  Final  Culture, blood (Routine X 2) w Reflex to ID Panel     Status: None   Collection Time: 09/02/16 10:25 AM  Result Value Ref Range Status   Specimen Description BLOOD BLOOD RIGHT HAND  Final   Special Requests BOTTLES DRAWN AEROBIC ONLY 5CC  Final   Culture NO GROWTH 5 DAYS  Final   Report Status 09/07/2016  FINAL  Final  Culture, respiratory (NON-Expectorated)     Status: None   Collection Time: 09/03/16  7:48 AM  Result Value Ref Range Status   Specimen Description TRACHEAL ASPIRATE  Final   Special Requests NONE  Final   Gram Stain   Final    ABUNDANT WBC PRESENT,BOTH PMN AND MONONUCLEAR RARE SQUAMOUS EPITHELIAL CELLS  PRESENT NO ORGANISMS SEEN    Culture FEW ENTEROBACTER SPECIES  Final   Report Status 09/06/2016 FINAL  Final   Organism ID, Bacteria ENTEROBACTER SPECIES  Final      Susceptibility   Enterobacter species - MIC*    CEFAZOLIN >=64 RESISTANT Resistant     CEFEPIME 2 RESISTANT Resistant     CEFTAZIDIME >=64 RESISTANT Resistant     CEFTRIAXONE >=64 RESISTANT Resistant     CIPROFLOXACIN <=0.25 SENSITIVE Sensitive     GENTAMICIN <=1 SENSITIVE Sensitive     IMIPENEM 1 SENSITIVE Sensitive     TRIMETH/SULFA <=20 SENSITIVE Sensitive     PIP/TAZO >=128 RESISTANT Resistant     * FEW ENTEROBACTER SPECIES  Culture, respiratory (NON-Expectorated)     Status: None   Collection Time: 09/14/16  8:37 AM  Result Value Ref Range Status   Specimen Description TRACHEAL ASPIRATE  Final   Special Requests NONE  Final   Gram Stain   Final    ABUNDANT WBC PRESENT,BOTH PMN AND MONONUCLEAR NO ORGANISMS SEEN    Culture FEW CANDIDA ALBICANS  Final   Report Status 09/17/2016 FINAL  Final    Coagulation Studies:  Recent Labs  09/17/16 0345 09/18/16 0457 09/19/16 0345  LABPROT 36.2* 30.4* 29.3*  INR 3.53 2.84 2.71    Urinalysis: No results for input(s): COLORURINE, LABSPEC, PHURINE, GLUCOSEU, HGBUR, BILIRUBINUR, KETONESUR, PROTEINUR, UROBILINOGEN, NITRITE, LEUKOCYTESUR in the last 72 hours.  Invalid input(s): APPERANCEUR    Imaging: Dg Abd 1 View  Result Date: 09/18/2016 CLINICAL DATA:  Feeding tube placement. EXAM: ABDOMEN - 1 VIEW COMPARISON:  09/16/2016 . FINDINGS: Feeding tube noted with tip projected over the third portion of the duodenum. Distended loops of small bowel and large bowel are noted. No free air. Prior cardiac valve replacement. Bibasilar atelectasis and bilateral pleural effusions noted . IMPRESSION: 1. Feeding tube noted with tip projected third portion the duodenum. Distended loops of small and large bowel are noted. No free air. 2. Prior cardiac valve replacement. Bibasilar  atelectasis and bilateral pleural effusions. Electronically Signed   By: Marcello Moores  Register   On: 09/18/2016 09:16   Dg Chest Port 1 View  Result Date: 09/19/2016 CLINICAL DATA:  Shortness of breath EXAM: PORTABLE CHEST 1 VIEW COMPARISON:  September 18, 2016 FINDINGS: Tracheostomy catheter tip is 5.8 cm above the carina. Dual-lumen central catheter tip is at the cavoatrial junction. Right subclavian catheter tip is also at the cavoatrial junction. Feeding tube tip is below the diaphragm. No pneumothorax. There is patchy airspace consolidation in both lower lobes with bilateral pleural effusions. There is also patchy atelectasis in the left lower lobe peripherally, stable. There is cardiomegaly with pulmonary venous hypertension. Atherosclerotic calcification is noted in the aorta. The patient is status post mitral valve replacement. IMPRESSION: Tube and catheter positions as described without pneumothorax. There is a degree of congestive heart failure with bilateral lower lobe airspace consolidation consistent with either alveolar edema or superimposed pneumonia. Both entities may exist concurrently. Appearance is essentially stable compared to 1 day prior. Electronically Signed   By: Lowella Grip III M.D.   On: 09/19/2016 08:32  Dg Chest Port 1 View  Result Date: 09/18/2016 CLINICAL DATA:  Tracheostomy EXAM: PORTABLE CHEST 1 VIEW COMPARISON:  09/16/2016 FINDINGS: Cardiomegaly. Status post median sternotomy and cardiac valve replacement. NG tube in place. Stable left IJ catheter with tip in right atrium. Central vascular congestion and mild perihilar interstitial prominence highly suspicious for pulmonary edema. Bilateral small pleural effusion with bilateral basilar atelectasis or infiltrate. IMPRESSION: Status post median sternotomy and cardiac valve replacement. NG tube in place. Stable left IJ catheter with tip in right atrium. Central vascular congestion and mild perihilar interstitial prominence  highly suspicious for pulmonary edema. Bilateral small pleural effusion with bilateral basilar atelectasis or infiltrate. Electronically Signed   By: Lahoma Crocker M.D.   On: 09/18/2016 08:03     Medications:   . sodium chloride     . amiodarone  200 mg Oral BID  . atorvastatin  20 mg Oral q1800  . chlorhexidine  15 mL Mouth Rinse BID  . clopidogrel  75 mg Oral Daily  . darbepoetin (ARANESP) injection - NON-DIALYSIS  100 mcg Subcutaneous Q Mon-1800  . famotidine  20 mg Oral Daily  . feeding supplement (NEPRO CARB STEADY)  1,000 mL Per Tube Q24H  . feeding supplement (PRO-STAT SUGAR FREE 64)  30 mL Per Tube TID  . Gerhardt's butt cream   Topical BID  . glycopyrrolate  1 mg Per Tube BID  . guaiFENesin  30 mL Per Tube Q6H  . insulin aspart  0-24 Units Subcutaneous Q4H  . insulin detemir  10 Units Subcutaneous BID  . mouth rinse  15 mL Mouth Rinse q12n4p  . midodrine  10 mg Oral TID WC  . multivitamin  15 mL Per Tube Daily  . QUEtiapine  25 mg Oral QHS  . Warfarin - Pharmacist Dosing Inpatient   Does not apply q1800   albuterol, HYDROcodone-acetaminophen, ondansetron (ZOFRAN) IV, sodium chloride flush  Assessment/ Plan:   Acute on chronic renal disease will continue with dialysis  S/p cath/MVR  CABG x 1 will need dialysis today had extra treatment yesterday  S/P STEMI  Diabetes reasonable  Anemia on aranesp  MRSA bacteremia    LOS: 56 Herny Scurlock W @TODAY @9 :48 AM

## 2016-09-19 NOTE — Progress Notes (Signed)
ANTICOAGULATION CONSULT NOTE - Follow Up Consult  Pharmacy Consult for Coumadin Indication: Aflutter  No Known Allergies  Patient Measurements: Height: 5\' 1"  (154.9 cm) Weight: 150 lb 2.1 oz (68.1 kg) IBW/kg (Calculated) : 47.8  Vital Signs: Temp: 97.4 F (36.3 C) (10/17 1206) Temp Source: Oral (10/17 1206) BP: 130/87 (10/17 1206) Pulse Rate: 77 (10/17 1206)  Labs:  Recent Labs  09/17/16 0345 09/17/16 0350 09/18/16 0457 09/19/16 0345  HGB 8.2*  --  8.4* 8.1*  HCT 26.7*  --  27.3* 26.1*  PLT 401*  --  413* 380  LABPROT 36.2*  --  30.4* 29.3*  INR 3.53  --  2.84 2.71  CREATININE  --  3.63* 4.39* 2.60*    Estimated Creatinine Clearance: 20.6 mL/min (by C-G formula based on SCr of 2.6 mg/dL (H)).   Assessment: 60yof continues on coumadin for aflutter. INR trended up after just two doses of 5mg  (last dose given 10/12). INR has since trended back down but still remains above goal at 2.71. She continues on amiodarone which was converted to PO yesterday. CBC stable. No bleeding.  Goal of Therapy:  INR 2-2.5 Monitor platelets by anticoagulation protocol: Yes   Plan:  1) No coumadin tonight 2) Daily INR  Deboraha Sprang 09/19/2016,12:59 PM

## 2016-09-19 NOTE — Progress Notes (Signed)
Patient ID: Deborah [name], female   DOB: December 16, 1955, 60 y.o.   MRN: [id number]   ADVANCED HF TEAM  60 y/o woman with DM2, HTN, HL and noncompliance presented to [facility] ER with infrerior posterior STEMI on 9/12. Taken to cath lab and found to have occluded small to moderate OM-1 branch. Underwent successful PCS with stent x 2 by Dr. Clayborn Bigness. Coronaries otherwise ok. EF 55-60% with severe MR confirmed by echo.   Post-cath developed respiratory failure and required intubation. Developed shock and renal failure with peak creatinine 3.4 (1.6 on admit). Transferred to [facility] for furthe management on 9/15.Marland Kitchen TEE showed normal LV (EF 60-65%) and normal RV function with ruptured posterior papillary muscle with severe posterior MR.  Underwent placement of Swan and IABP. S/P MVR 08/23/16.   Treated for group B Strep PNA.   Echo 9/23  RV and LV are normal EF 60%  Extubated 9/29.  However, picture consistent with septic shock on 9/30 so pressors started and she was re-intubated. S/P Trach 10/5.  Developed AFL S/P TEE DC-CV 09/04/16   Yesterday dobutamine stopped. Todays CO-OX 59%. Remais in NSR.  Having more secretions today on trach. Denies complaints. WBC rising slightly.   Echo 09/17/16: Normal LV size with mild LV hypertrophy. EF 60-65%. Normal RV   size with mildly decreased systolic function. No evidence for   pulmonary hypertension. The interventricular septum is shifted   mildly to the left but there is no evidence for marked RV   abnormality. The bioprosthetic mitral valve appears to function   normally.  Scheduled Meds: . amiodarone  200 mg Oral BID  . atorvastatin  20 mg Oral q1800  . chlorhexidine  15 mL Mouth Rinse BID  . clopidogrel  75 mg Oral Daily  . darbepoetin (ARANESP) injection - NON-DIALYSIS  100 mcg Subcutaneous Q Mon-1800  . famotidine  20 mg Oral Daily  . feeding supplement (NEPRO CARB STEADY)  1,000 mL Per Tube Q24H  . feeding supplement (PRO-STAT SUGAR FREE 64)  30 mL Per  Tube TID  . Gerhardt's butt cream   Topical BID  . glycopyrrolate  1 mg Per Tube BID  . guaiFENesin  30 mL Per Tube Q6H  . insulin aspart  0-24 Units Subcutaneous Q4H  . insulin detemir  10 Units Subcutaneous BID  . mouth rinse  15 mL Mouth Rinse q12n4p  . midodrine  10 mg Oral TID WC  . multivitamin  15 mL Per Tube Daily  . QUEtiapine  25 mg Oral QHS  . Warfarin - Pharmacist Dosing Inpatient   Does not apply q1800   Continuous Infusions: . sodium chloride     PRN Meds:.albuterol, HYDROcodone-acetaminophen, ondansetron (ZOFRAN) IV, sodium chloride flush    Vitals:   09/19/16 1354 09/19/16 1402 09/19/16 1430 09/19/16 1500  BP: (!) 127/105 129/67 (!) 153/81 124/70  Pulse: 75 76 82 (!) 58  Resp: (!) 30     Temp: 97.6 F (36.4 C)     TempSrc: Oral     SpO2: 95%     Weight: 66.6 kg (146 lb 13.2 oz)     Height:        Intake/Output Summary (Last 24 hours) at 09/19/16 1504 Last data filed at 09/19/16 1300  Gross per 24 hour  Intake           1082.5 ml  Output             2250 ml  Net          -  1167.5 ml    LABS: Basic Metabolic Panel:  Recent Labs  09/18/16 0457 09/19/16 0345  NA 132* 134*  K 4.0 3.2*  CL 95* 98*  CO2 23 28  GLUCOSE 138* 160*  BUN 98* 44*  CREATININE 4.39* 2.60*  CALCIUM 9.0 8.0*  PHOS 5.9* 3.0   Liver Function Tests:  Recent Labs  09/18/16 0457 09/19/16 0345  ALBUMIN 2.5* 2.2*   No results for input(s): LIPASE, AMYLASE in the last 72 hours. CBC:  Recent Labs  09/18/16 0457 09/19/16 0345  WBC 17.7* 18.6*  HGB 8.4* 8.1*  HCT 27.3* 26.1*  MCV 93.5 93.9  PLT 413* 380   Cardiac Enzymes: No results for input(s): CKTOTAL, CKMB, CKMBINDEX, TROPONINI in the last 72 hours. BNP: Invalid input(s): POCBNP D-Dimer: No results for input(s): DDIMER in the last 72 hours. Hemoglobin A1C: No results for input(s): HGBA1C in the last 72 hours. Fasting Lipid Panel: No results for input(s): CHOL, HDL, LDLCALC, TRIG, CHOLHDL, LDLDIRECT in the  last 72 hours. Thyroid Function Tests: No results for input(s): TSH, T4TOTAL, T3FREE, THYROIDAB in the last 72 hours.  Invalid input(s): FREET3 Anemia Panel: No results for input(s): VITAMINB12, FOLATE, FERRITIN, TIBC, IRON, RETICCTPCT in the last 72 hours.  PHYSICAL EXAM General: lying in bed, lots of trach secretions Neck: Trach, JVP difficult Lungs: Rhonchi throughout. Upper airway gugrling CV: Nondisplaced PMI.  Heart regular S1/S2, no S3/S4, 1/6 SEM USB.   Abdomen: Soft, NT, ND, no HSM. No bruits or masses. +BS  Neurologic: MAE. Follows commands.   Extremities: No clubbing or cyanosis. R and LLE SCDs.   TELEMETRY: NSR 70s     Assessment:   1. Cardiogenic shock: Due to acute severe MR. EF 70% on 9/25 echo.  2. CAD: Inferior posterior STEMI on 9/13 with DES x 2 to OM-1      --Plavix restarted on 9/22 3. 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     --s/p MVR 08/23/2016  4. AKI on CKD stage III - due to ATN likely from cardiogenic shock + contrast with cath. Has required CVVH.  5. Acute respiratory failure: Pulmonary edema.  6. DM2 7. Suspect component of septic shock: Possible PNA, group B Strep in sputum culture.  8. Anemia 9. PVCs   10. Shock liver 11. MRSA bacteremia 12. Atrial fibrillation: Paroxysmal.  On amiodarone.    Plan/Discussion:    Now off dobutamine. Co-ox ok. Keep off for now.   Tolerating iHD,   Maintaining NSR. INR 2.7  Main issue today is increased pulmonary secretions. Will get sputum sample. RT contacted for pulmonary toilet.   Will likely need [facility].   Bensimhon, Daniel,MD 8:44 PM

## 2016-09-19 NOTE — Progress Notes (Signed)
Occupational Therapy Progress note (late entry)  Pt demonstrates improving sitting and standing balance, improved independence with functional mobility and activity tolerance.  Continue to recommend CIR    09/18/16 1400  OT Visit Information  Last OT Received On 09/19/16  Assistance Needed +2  PT/OT/SLP Co-Evaluation/Treatment Yes  Reason for Co-Treatment Complexity of the patient's impairments (multi-system involvement);For patient/therapist safety  History of Present Illness 60 yo admitted to [facility] 9/12 with STEMI develped cardiogenic shock with ETT and cRRT initiated on 9/14 after cath, transfer to [facility] 3/15, metabolic acidosis, underwent MVR 9/20, extubated 9/29, reintubated 9/30, 9/25-9/29 hemiplegia developed and resolved, trach and bronchoscopy 10/5. PMHx: DM, HTN, HLD  Precautions  Precautions Sternal;Fall  Precaution Comments trach, flexiseal  Pain Assessment  Pain Assessment No/denies pain  Cognition  Arousal/Alertness Awake/alert  Behavior During Therapy Anxious  Overall Cognitive Status Impaired/Different from baseline  Area of Impairment Attention;Following commands;Safety/judgement;Problem solving;Memory  Current Attention Level Sustained  Memory Decreased recall of precautions;Decreased short-term memory  Following Commands Follows one step commands inconsistently  General Comments pt educated for sternal precautions  Difficult to assess due to Tracheostomy  ADL  Toilet Transfer Moderate assistance;+2 for physical assistance;Stand-pivot;BSC  Functional mobility during ADLs Moderate assistance;+2 for physical assistance  Bed Mobility  Overal bed mobility Needs Assistance  Bed Mobility Rolling;Sidelying to Sit  Rolling Mod assist  Sidelying to sit Mod assist;+2 for safety/equipment  General bed mobility comments cues for sequence and safety with assist to rotate trunk and rise from surface  Balance  Overall balance assessment Needs assistance  Sitting balance-Leahy  Scale Fair  Sitting balance - Comments pt sat EOB 3 min with minguard assist  Standing balance-Leahy Scale Poor  Standing balance comment Pt requires mod A +2 for standing balance   Transfers  Overall transfer level Needs assistance  Transfers Sit to/from Stand;Stand Pivot Transfers  Sit to Stand Mod assist;+2 physical assistance;+2 safety/equipment  Stand pivot transfers Mod assist;+2 safety/equipment;+2 physical assistance  General transfer comment pt stood x 3 with trials of grossly 15 sec, 40 sec, 10 sec with standing marching on 1st trial. assist to advance RLE with pivot to chair and cues throughout with bil UE support for each trial and assist for anterior translation and rise from surface  General Exercises - Upper Extremity  Shoulder Flexion AAROM;AROM;Right;Left;10 reps;Seated  Shoulder ABduction AAROM;AROM;Right;Left;10 reps;Seated  OT - End of Session  Equipment Utilized During Treatment Gait belt;Oxygen  Activity Tolerance Patient limited by fatigue  Patient left in chair;with call bell/phone within reach;with chair alarm set  Nurse Communication Mobility status  OT Assessment/Plan  OT Plan Discharge plan remains appropriate  OT Frequency (ACUTE ONLY) Min 3X/week  Follow Up Recommendations CIR;Supervision/Assistance - 24 hour  OT Equipment 3 in 1 bedside comode  OT Goal Progression  Progress towards OT goals Progressing toward goals  ADL Goals  Pt Will Perform Grooming with supervision;sitting  Pt Will Perform Upper Body Bathing with supervision;sitting  Pt Will Perform Lower Body Bathing with mod assist;sit to/from stand  Pt Will Transfer to Toilet with min assist;stand pivot transfer;bedside commode  Pt Will Perform Toileting - Clothing Manipulation and hygiene with min assist;sit to/from stand  Pt/caregiver will Perform Home Exercise Program Right Upper extremity;Increased strength;Left upper extremity;With Supervision;With written HEP provided  OT Time Calculation   OT Start Time (ACUTE ONLY) 1114  OT Stop Time (ACUTE ONLY) 1145  OT Time Calculation (min) 31 min  OT General Charges  $OT Visit 1 Procedure  OT Treatments  $Therapeutic  Activity 8-22 mins  [facility], OTR/L [phone]

## 2016-09-20 DIAGNOSIS — D62 Acute posthemorrhagic anemia: Secondary | ICD-10-CM

## 2016-09-20 DIAGNOSIS — E119 Type 2 diabetes mellitus without complications: Secondary | ICD-10-CM

## 2016-09-20 DIAGNOSIS — N183 Chronic kidney disease, stage 3 unspecified: Secondary | ICD-10-CM

## 2016-09-20 DIAGNOSIS — D72820 Lymphocytosis (symptomatic): Secondary | ICD-10-CM

## 2016-09-20 DIAGNOSIS — R0682 Tachypnea, not elsewhere classified: Secondary | ICD-10-CM

## 2016-09-20 DIAGNOSIS — I214 Non-ST elevation (NSTEMI) myocardial infarction: Secondary | ICD-10-CM

## 2016-09-20 DIAGNOSIS — E871 Hypo-osmolality and hyponatremia: Secondary | ICD-10-CM

## 2016-09-20 LAB — CBC
HCT: 28.3 % — ABNORMAL LOW (ref 36.0–46.0)
Hemoglobin: 8.9 g/dL — ABNORMAL LOW (ref 12.0–15.0)
MCH: 29.1 pg (ref 26.0–34.0)
MCHC: 31.4 g/dL (ref 30.0–36.0)
MCV: 92.5 fL (ref 78.0–100.0)
Platelets: 400 10*3/uL (ref 150–400)
RBC: 3.06 MIL/uL — ABNORMAL LOW (ref 3.87–5.11)
RDW: 17 % — ABNORMAL HIGH (ref 11.5–15.5)
WBC: 22 10*3/uL — ABNORMAL HIGH (ref 4.0–10.5)

## 2016-09-20 LAB — GLUCOSE, CAPILLARY
GLUCOSE-CAPILLARY: 103 mg/dL — AB (ref 65–99)
GLUCOSE-CAPILLARY: 177 mg/dL — AB (ref 65–99)
GLUCOSE-CAPILLARY: 58 mg/dL — AB (ref 65–99)
GLUCOSE-CAPILLARY: 74 mg/dL (ref 65–99)
GLUCOSE-CAPILLARY: 99 mg/dL (ref 65–99)
Glucose-Capillary: 133 mg/dL — ABNORMAL HIGH (ref 65–99)
Glucose-Capillary: 178 mg/dL — ABNORMAL HIGH (ref 65–99)

## 2016-09-20 LAB — RENAL FUNCTION PANEL
ALBUMIN: 2.4 g/dL — AB (ref 3.5–5.0)
Anion gap: 10 (ref 5–15)
BUN: 31 mg/dL — AB (ref 6–20)
CALCIUM: 8.3 mg/dL — AB (ref 8.9–10.3)
CO2: 25 mmol/L (ref 22–32)
CREATININE: 1.94 mg/dL — AB (ref 0.44–1.00)
Chloride: 96 mmol/L — ABNORMAL LOW (ref 101–111)
GFR calc Af Amer: 31 mL/min — ABNORMAL LOW (ref >60)
GFR calc non Af Amer: 27 mL/min — ABNORMAL LOW (ref >60)
GLUCOSE: 104 mg/dL — AB (ref 65–99)
PHOSPHORUS: 1.8 mg/dL — AB (ref 2.5–4.6)
Potassium: 4 mmol/L (ref 3.5–5.1)
SODIUM: 131 mmol/L — AB (ref 135–145)

## 2016-09-20 LAB — COOXEMETRY PANEL
Carboxyhemoglobin: 1.3 % (ref 0.5–1.5)
Methemoglobin: 1 % (ref 0.0–1.5)
O2 Saturation: 58.5 %
Total hemoglobin: 8.9 g/dL — ABNORMAL LOW (ref 12.0–16.0)

## 2016-09-20 LAB — PROTIME-INR
INR: 1.9
Prothrombin Time: 22.1 seconds — ABNORMAL HIGH (ref 11.4–15.2)

## 2016-09-20 MED ORDER — WARFARIN SODIUM 5 MG PO TABS
5.0000 mg | ORAL_TABLET | Freq: Once | ORAL | Status: AC
  Administered 2016-09-20: 5 mg via ORAL
  Filled 2016-09-20: qty 1

## 2016-09-20 NOTE — Progress Notes (Signed)
Patient ID: Deborah [name], female   DOB: [birth year], 60 y.o.   MRN: [id number] EVENING ROUNDS NOTE :     [address]       [place] [address]             [phone]                 20 Days Post-Op Procedure(s) (LRB): INSERTION OF DIALYSIS CATHETER LEFT INTERNAL JUGULAR VEIN & INSERTION OF TRIPLE LUMEN RIGHT INTERNAL JUGULAR VEIN (Bilateral)  Total Length of Stay:  LOS: 33 days  BP (!) 146/91   Pulse 73   Temp 98 F (36.7 C) (Oral)   Resp (!) 21   Ht 5\' 1"  (1.549 m)   Wt 139 lb 8.8 oz (63.3 kg)   SpO2 100%   BMI 26.37 kg/m   .Intake/Output      10/18 0701 - 10/19 0700   NG/GT 600   Total Intake(mL/kg) 600 (9.5)   Other    Total Output     Net +600         . sodium chloride       Lab Results  Component Value Date   WBC 22.0 (H) 09/20/2016   HGB 8.9 (L) 09/20/2016   HCT 28.3 (L) 09/20/2016   PLT 400 09/20/2016   GLUCOSE 104 (H) 09/20/2016   CHOL 207 (H) 07/09/2014   TRIG 174.0 (H) 07/09/2014   HDL 40.60 07/09/2014   LDLCALC 132 (H) 07/09/2014   ALT 27 09/13/2016   AST 21 09/13/2016   NA 131 (L) 09/20/2016   K 4.0 09/20/2016   CL 96 (L) 09/20/2016   CREATININE 1.94 (H) 09/20/2016   BUN 31 (H) 09/20/2016   CO2 25 09/20/2016   TSH 1.485 Test methodology is 3rd generation TSH 01/26/2010   INR 1.90 09/20/2016   HGBA1C 13.3 (H) 08/15/2016   MICROALBUR 15.8 (H) 07/09/2014   Walked 10 feet  Grace Isaac MD  Beeper [phone] Office [phone] 09/20/2016 7:46 PM

## 2016-09-20 NOTE — Progress Notes (Signed)
ANTICOAGULATION CONSULT NOTE - Follow Up Consult  Pharmacy Consult for Coumadin Indication: Aflutter  No Known Allergies  Patient Measurements: Height: 5\' 1"  (154.9 cm) Weight: 139 lb 8.8 oz (63.3 kg) IBW/kg (Calculated) : 47.8  Vital Signs: Temp: 98.6 F (37 C) (10/18 1100) Temp Source: Oral (10/18 1100) BP: 109/95 (10/18 1200) Pulse Rate: 79 (10/18 1100)  Labs:  Recent Labs  09/18/16 0457 09/19/16 0345 09/20/16 0500 09/20/16 0519  HGB 8.4* 8.1* 8.9*  --   HCT 27.3* 26.1* 28.3*  --   PLT 413* 380 400  --   LABPROT 30.4* 29.3*  --  22.1*  INR 2.84 2.71  --  1.90  CREATININE 4.39* 2.60* 1.94*  --     Estimated Creatinine Clearance: 26.6 mL/min (by C-G formula based on SCr of 1.94 mg/dL (H)).   Assessment: 60yof continues on coumadin for aflutter. INR trended up after just two doses of 5mg  (last dose given 10/12). INR has since trended back down and is now slightly below goal at 1.9. She continues on po amiodarone. CBC stable. No bleeding.  Goal of Therapy:  INR 2-2.5 Monitor platelets by anticoagulation protocol: Yes   Plan:  1) Coumadin 5mg  x 1 2) Daily INR  Deboraha Sprang 09/20/2016,1:39 PM

## 2016-09-20 NOTE — Consult Note (Signed)
Physical Medicine and Rehabilitation Consult   Reason for Consult: Debility due to multiple medical issues.  Referring Physician: Dr. Prescott Gum.   HPI: Deborah [name] is a 60 y.o. female with history of T2DM, HTN, CKD stage III with medical non-compliance who was originally admitted to [facility] 08/15/16 with NSTEMI and underwent cardiac cath with stenting. History taken from chart review. She developed acute renal failure with metabolic acidosis and oliguria requiring CRRT. She went on to develop cardiogenic shock requiring intubation and was transferred to [facility] on 08/18/16 for treatment. 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 She underwent mitral valve repair by Prescott Gum on 9/21.  Right IJ placed on 9/28 and HD initiated.  She was extubated on 9/29 but reintubated due to septic shock due to staph bacteremia and required pressors for support.  Afib treated with TEE DCCV and in NSR on amiodarone.  She underwent tracheostomy on 10/5 and was weaned to ATC and tolerating PMSV for short periods of time. Has completed treatment for enterobacter HCAP but continues to have thick secretions. She remains NPO and was noted to be significantly deconditioned. PT/OT working on pregait activity--patient noted to have difficulty following one step commands with decreased memory, cognitive deficits and able to sit at EOB for 3 minutes. CIR recommended for follow up therapy.     Review of Systems  Constitutional: Positive for malaise/fatigue (PTA).  HENT: Negative for hearing loss.   Eyes: Negative for blurred vision and double vision.  Respiratory: Positive for cough and sputum production. Negative for shortness of breath.   Gastrointestinal: Negative for abdominal pain, heartburn and nausea.  Musculoskeletal: Negative for back pain, joint pain, myalgias and neck pain.  Neurological: Positive for focal weakness (RLE pain (weakness?) for past few years).  Negative for dizziness, tingling and headaches.  Psychiatric/Behavioral: Positive for depression. The patient is nervous/anxious.      Past Medical History:  Diagnosis Date  . Diabetes mellitus   . Hyperlipidemia   . Hypertension     Past Surgical History:  Procedure Laterality Date  . CARDIAC CATHETERIZATION N/A 08/15/2016   Procedure: Left Heart Cath and Coronary Angiography;  Surgeon: Yolonda Kida, MD;  Location: [place] [facility];  Service: Cardiovascular;  Laterality: N/A;  . CARDIAC CATHETERIZATION N/A 08/15/2016   Procedure: Coronary Stent Intervention;  Surgeon: Yolonda Kida, MD;  Location: [place] [facility];  Service: Cardiovascular;  Laterality: N/A;  . CARDIAC CATHETERIZATION N/A 08/18/2016   Procedure: Right Heart Cath;  Surgeon: Jolaine Artist, MD;  Location: [place] [facility];  Service: Cardiovascular;  Laterality: N/A;  . CARDIAC CATHETERIZATION N/A 08/18/2016   Procedure: IABP Insertion;  Surgeon: Jolaine Artist, MD;  Location: [place] [facility];  Service: Cardiovascular;  Laterality: N/A;  . CARDIAC CATHETERIZATION Right 08/23/2016   Procedure: CENTRAL LINE INSERTION RIGHT SUBCLAVIAN;  Surgeon: Ivin Poot, MD;  Location: [place];  Service: Open Heart Surgery;  Laterality: Right;  . ENDOVEIN HARVEST OF GREATER SAPHENOUS VEIN Left 08/23/2016   Procedure: ENDOVEIN HARVEST OF GREATER SAPHENOUS VEIN;  Surgeon: Ivin Poot, MD;  Location: [place];  Service: Open Heart Surgery;  Laterality: Left;  . INSERTION OF DIALYSIS CATHETER Bilateral 08/31/2016   Procedure: INSERTION OF DIALYSIS CATHETER LEFT INTERNAL JUGULAR VEIN & INSERTION OF TRIPLE LUMEN RIGHT INTERNAL JUGULAR VEIN;  Surgeon: Angelia Mould, MD;  Location: [place];  Service: Vascular;  Laterality: Bilateral;  .  INTRAOPERATIVE TRANSESOPHAGEAL ECHOCARDIOGRAM N/A 08/23/2016   Procedure: INTRAOPERATIVE TRANSESOPHAGEAL ECHOCARDIOGRAM;  Surgeon: Ivin Poot, MD;  Location: [place];  Service:  Open Heart Surgery;  Laterality: N/A;  . MITRAL VALVE REPAIR N/A 08/23/2016   Procedure: MITRAL VALVE REPAIR (MVR) USING 25MM EDWARDS MAGNA EASE BIOPROSTHESIS MITRAL  VALVE;  Surgeon: Ivin Poot, MD;  Location: [place];  Service: Open Heart Surgery;  Laterality: N/A;  . VAGINAL DELIVERY     x 6    Family History  Problem Relation Age of Onset  . Hypertension Mother   . Diabetes Mother   . Breast cancer Sister     Social History:  Lives with son and helps manage his home. 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 Has multiple family member in [place].  She reports that she has never smoked. She has never used smokeless tobacco. She reports that she does not drink alcohol or use drugs.    Allergies: No Known Allergies    Medications Prior to Admission  Medication Sig Dispense Refill  . amLODipine (NORVASC) 10 MG tablet Take 1 tablet (10 mg total) by mouth daily. 90 tablet 0  . aspirin EC 81 MG tablet Take 81 mg by mouth every other day.    Marland Kitchen atorvastatin (LIPITOR) 20 MG tablet Take 1 tablet (20 mg total) by mouth daily. 30 tablet 1  . ranitidine (ZANTAC) 150 MG tablet TAKE ONE TABLET BY MOUTH TWICE DAILY 180 tablet 1  . [EXPIRED] ticagrelor (BRILINTA) 90 MG TABS tablet Take 1 tablet (90 mg total) by mouth 2 (two) times daily. 60 tablet 6  . [DISCONTINUED] metFORMIN (GLUCOPHAGE) 500 MG tablet Take 500 mg by mouth 2 (two) times daily with a meal.    . [DISCONTINUED] metoprolol (LOPRESSOR) 100 MG tablet Take 100 mg by mouth 2 (two) times daily.    . [DISCONTINUED] valsartan-hydrochlorothiazide (DIOVAN-HCT) 320-25 MG tablet Take 1 tablet by mouth daily.      Home: Home Living Family/patient expects to be discharged to:: Unsure Living Arrangements: Alone Available Help at Discharge: Family, Available PRN/intermittently Type of Home: House Additional Comments: pt on vent and trach and unable to provide all PLOF and home  environment at this time.   Functional History: Prior Function Level of Independence: Independent Functional Status:  Mobility: Bed Mobility Overal bed mobility: Needs Assistance Bed Mobility: Rolling, Sidelying to Sit Rolling: Mod assist Sidelying to sit: Mod assist, +2 for safety/equipment Supine to sit: Max assist, +2 for physical assistance General bed mobility comments: cues for sequence and safety with assist to rotate trunk and rise from surface Transfers Overall transfer level: Needs assistance Equipment used: 2 person hand held assist Transfers: Sit to/from Stand, Stand Pivot Transfers Sit to Stand: Mod assist, +2 physical assistance, +2 safety/equipment Stand pivot transfers: Mod assist, +2 safety/equipment, +2 physical assistance General transfer comment: pt stood x 3 with trials of grossly 15 sec, 40 sec, 10 sec with standing marching on 1st trial. assist to advance RLE with pivot to chair and cues throughout with bil UE support for each trial and assist for anterior translation and rise from surface      ADL: ADL Overall ADL's : Needs assistance/impaired Eating/Feeding: NPO Grooming: Wash/dry hands, Wash/dry face, Moderate assistance, Sitting Upper Body Bathing: Maximal assistance, Sitting Lower Body Bathing: Total assistance, Sit to/from stand Upper Body Dressing : Total assistance, Sitting Lower Body Dressing: Total assistance, Sit to/from stand Toilet Transfer: Moderate assistance, +2 for physical assistance,  Stand-pivot, BSC Toileting- Clothing Manipulation and Hygiene: Total assistance, Sit to/from stand Functional mobility during ADLs: Moderate assistance, +2 for physical assistance  Cognition: Cognition Overall Cognitive Status: Impaired/Different from baseline Orientation Level: (P) Intubated/Tracheostomy - Unable to assess Cognition Arousal/Alertness: Awake/alert Behavior During Therapy: Anxious Overall Cognitive Status: Impaired/Different from  baseline Area of Impairment: Attention, Following commands, Safety/judgement, Problem solving, Memory Current Attention Level: Sustained Memory: Decreased recall of precautions, Decreased short-term memory Following Commands: Follows one step commands inconsistently Safety/Judgement: Decreased awareness of safety Problem Solving: Slow processing, Requires verbal cues, Difficulty sequencing General Comments: pt educated for sternal precautions Difficult to assess due to: Tracheostomy  Blood pressure 109/69, pulse 71, temperature 97.8 F (36.6 C), temperature source Oral, resp. rate 17, height 5' 1"  (1.549 m), weight 63.3 kg (139 lb 8.8 oz), SpO2 95 %. Physical Exam  Nursing note and vitals reviewed. Constitutional: She appears well-developed and well-nourished.  Lying in bed with mittens in place.   HENT:  Head: Normocephalic and atraumatic.  Eyes: Conjunctivae and EOM are normal. Pupils are equal, round, and reactive to light.  Neck: Normal range of motion.  Trach in place with ATC.   Cardiovascular: Normal rate.  An irregular rhythm present.  Murmur heard. Respiratory: Effort normal. No stridor. She has decreased breath sounds. She has rhonchi. She exhibits tenderness.  Congested upper airway sounds.   GI: Soft. Bowel sounds are normal. She exhibits no distension. There is no tenderness.  Rectal tube in place.   Genitourinary:  Genitourinary Comments: +Rectal tube  Musculoskeletal: She exhibits no edema or tenderness.  Dry dressing LLE with bruising at donor site.   Neurological: She is alert.  Dysphonia noted but able to vocalize occasionally without trach occluded.  Able to state age, DOB and needed cues for month.  Able to follow simple one step commands for the most part, but does have difficulty at times.  Mild delay in processing and needed redirection.  Motor: B/l UE 4+/5 LLE: Hip flexion 4/5, knee extension 4+/5, ankle dorsi/plantar flexion 4+/5 RLE: 3-/5 proximally,  4/5 distally DTRs 4+ RLE.   Skin: Skin is warm and dry.  Psychiatric: Her affect is blunt. Her speech is delayed. She is slowed.    Results for orders placed or performed during the hospital encounter of 08/18/16 (from the past 24 hour(s))  Glucose, capillary     Status: Abnormal   Collection Time: 09/19/16 12:04 PM  Result Value Ref Range   Glucose-Capillary 188 (H) 65 - 99 mg/dL   Comment 1 Capillary Specimen    Comment 2 Notify RN   Culture, respiratory (NON-Expectorated)     Status: None (Preliminary result)   Collection Time: 09/19/16  1:00 PM  Result Value Ref Range   Specimen Description TRACHEAL ASPIRATE    Special Requests NONE    Gram Stain      FEW WBC PRESENT, PREDOMINANTLY PMN FEW GRAM POSITIVE COCCI IN PAIRS AND CHAINS RARE GRAM POSITIVE COCCI IN CLUSTERS RARE YEAST    Culture PENDING    Report Status PENDING   Glucose, capillary     Status: Abnormal   Collection Time: 09/19/16  7:32 PM  Result Value Ref Range   Glucose-Capillary 114 (H) 65 - 99 mg/dL   Comment 1 Capillary Specimen    Comment 2 Notify RN    Comment 3 Document in Chart   Glucose, capillary     Status: Abnormal   Collection Time: 09/19/16 11:42 PM  Result Value Ref Range   Glucose-Capillary 270 (  H) 65 - 99 mg/dL   Comment 1 Capillary Specimen    Comment 2 Notify RN    Comment 3 Document in Chart   Glucose, capillary     Status: Abnormal   Collection Time: 09/20/16  3:56 AM  Result Value Ref Range   Glucose-Capillary 103 (H) 65 - 99 mg/dL   Comment 1 Capillary Specimen    Comment 2 Notify RN    Comment 3 Document in Chart   Cooxemetry Panel (carboxy, met, total hgb, O2 sat)     Status: Abnormal   Collection Time: 09/20/16  4:30 AM  Result Value Ref Range   Total hemoglobin 8.9 (L) 12.0 - 16.0 g/dL   O2 Saturation 58.5 %   Carboxyhemoglobin 1.3 0.5 - 1.5 %   Methemoglobin 1.0 0.0 - 1.5 %  CBC     Status: Abnormal   Collection Time: 09/20/16  5:00 AM  Result Value Ref Range   WBC 22.0  (H) 4.0 - 10.5 K/uL   RBC 3.06 (L) 3.87 - 5.11 MIL/uL   Hemoglobin 8.9 (L) 12.0 - 15.0 g/dL   HCT 28.3 (L) 36.0 - 46.0 %   MCV 92.5 78.0 - 100.0 fL   MCH 29.1 26.0 - 34.0 pg   MCHC 31.4 30.0 - 36.0 g/dL   RDW 17.0 (H) 11.5 - 15.5 %   Platelets 400 150 - 400 K/uL  Renal function panel     Status: Abnormal   Collection Time: 09/20/16  5:00 AM  Result Value Ref Range   Sodium 131 (L) 135 - 145 mmol/L   Potassium 4.0 3.5 - 5.1 mmol/L   Chloride 96 (L) 101 - 111 mmol/L   CO2 25 22 - 32 mmol/L   Glucose, Bld 104 (H) 65 - 99 mg/dL   BUN 31 (H) 6 - 20 mg/dL   Creatinine, Ser 1.94 (H) 0.44 - 1.00 mg/dL   Calcium 8.3 (L) 8.9 - 10.3 mg/dL   Phosphorus 1.8 (L) 2.5 - 4.6 mg/dL   Albumin 2.4 (L) 3.5 - 5.0 g/dL   GFR calc non Af Amer 27 (L) >60 mL/min   GFR calc Af Amer 31 (L) >60 mL/min   Anion gap 10 5 - 15  Protime-INR     Status: Abnormal   Collection Time: 09/20/16  5:19 AM  Result Value Ref Range   Prothrombin Time 22.1 (H) 11.4 - 15.2 seconds   INR 1.90    Dg Abd 1 View  Result Date: 09/18/2016 CLINICAL DATA:  Feeding tube placement. EXAM: ABDOMEN - 1 VIEW COMPARISON:  09/16/2016 . FINDINGS: Feeding tube noted with tip projected over the third portion of the duodenum. Distended loops of small bowel and large bowel are noted. No free air. Prior cardiac valve replacement. Bibasilar atelectasis and bilateral pleural effusions noted . IMPRESSION: 1. Feeding tube noted with tip projected third portion the duodenum. Distended loops of small and large bowel are noted. No free air. 2. Prior cardiac valve replacement. Bibasilar atelectasis and bilateral pleural effusions. Electronically Signed   By: Marcello Moores  Register   On: 09/18/2016 09:16   Dg Chest Port 1 View  Result Date: 09/19/2016 CLINICAL DATA:  Shortness of breath EXAM: PORTABLE CHEST 1 VIEW COMPARISON:  September 18, 2016 FINDINGS: Tracheostomy catheter tip is 5.8 cm above the carina. Dual-lumen central catheter tip is at the cavoatrial  junction. Right subclavian catheter tip is also at the cavoatrial junction. Feeding tube tip is below the diaphragm. No pneumothorax. There is patchy airspace consolidation  in both lower lobes with bilateral pleural effusions. There is also patchy atelectasis in the left lower lobe peripherally, stable. There is cardiomegaly with pulmonary venous hypertension. Atherosclerotic calcification is noted in the aorta. The patient is status post mitral valve replacement. IMPRESSION: Tube and catheter positions as described without pneumothorax. There is a degree of congestive heart failure with bilateral lower lobe airspace consolidation consistent with either alveolar edema or superimposed pneumonia. Both entities may exist concurrently. Appearance is essentially stable compared to 1 day prior. Electronically Signed   By: Lowella Grip III M.D.   On: 09/19/2016 08:32    Assessment/Plan: Diagnosis: Debility due to multiple medical issues Labs and images independently reviewed.  Records reviewed and summated above.  1. Does the need for close, 24 hr/day medical supervision in concert with the patient's rehab needs make it unreasonable for this patient to be served in a less intensive setting? Yes  2. Co-Morbidities requiring supervision/potential complications: T2 DM (Monitor in accordance with exercise and adjust meds as necessary), HTN (monitor and provide prns in accordance with increased physical exertion and pain), AKI on CKD stage III (avoid nephrotoxic meds), medical non-compliance (cont to educate), NSTEMI (cont meds), MR, CHF (Monitor in accordance with increased physical activity and avoid UE resistance excercises), Afib (monitor HR with increased physical activity), tachypnea (monitor RR and O2 Sats with increased physical exertion), hyponatremia (cont to monitor, treat if necessary), leukocytosis (cont to monitor for signs and symptoms of infection, further workup if indicated), ABLA (transfuse if  necessary to ensure appropriate perfusion for increased activity tolerance) 3. Due to bladder management, bowel management, safety, skin/wound care, disease management, medication administration, pain management and patient education, does the patient require 24 hr/day rehab nursing? Yes 4. Does the patient require coordinated care of a physician, rehab nurse, PT (1-2 hrs/day, 5 days/week), OT (1-2 hrs/day, 5 days/week) and SLP (1-2 hrs/day, 5 days/week) to address physical and functional deficits in the context of the above medical diagnosis(es)? Yes Addressing deficits in the following areas: balance, endurance, locomotion, strength, transferring, bowel/bladder control, bathing, dressing, feeding, toileting, speech, swallowing and psychosocial support 5. Can the patient actively participate in an intensive therapy program of at least 3 hrs of therapy per day at least 5 days per week? Potentially 6. The potential for patient to make measurable gains while on inpatient rehab is excellent 7. Anticipated functional outcomes upon discharge from inpatient rehab are supervision and min assist  with PT, supervision and min assist with OT, modified independent and supervision with SLP. 8. Estimated rehab length of stay to reach the above functional goals is: 18-21 days. 9. Does the patient have adequate social supports and living environment to accommodate these discharge functional goals? Potentially 10. Anticipated D/C setting: Home 11. Anticipated post D/C treatments: HH therapy and Home excercise program 12. Overall Rehab/Functional Prognosis: good  RECOMMENDATIONS: This patient's condition is appropriate for continued rehabilitative care in the following setting: CIR if caregiver support available upon discharge once medically stable. Patient has agreed to participate in recommended program. Potentially Note that insurance prior authorization may be required for reimbursement for recommended  care.  Comment: Rehab Admissions Coordinator to follow up.  Delice Lesch, MD, Mellody Drown 09/20/2016

## 2016-09-20 NOTE — Progress Notes (Signed)
OT Cancellation Note  Patient Details Name: Deborah [name] MRN: [id number] DOB: [birth year]   Cancelled Treatment:    Reason Eval/Treat Not Completed: Fatigue/lethargy limiting ability to participate.  Will reattempt.  [place], OTR/L [phone]   Lucille Passy M 09/20/2016, 5:33 PM

## 2016-09-20 NOTE — Progress Notes (Signed)
Physical Therapy Treatment Patient Details Name: Deborah [name] MRN: [id number] DOB: Feb 20, 1956 Today's Date: 09/20/2016    History of Present Illness 60 yo admitted to [facility] 9/12 with STEMI develped cardiogenic shock with ETT and cRRT initiated on 9/14 after cath, transfer to [facility] 7/82, metabolic acidosis, underwent MVR 9/20, extubated 9/29, reintubated 9/30, 9/25-9/29 hemiplegia developed and resolved, trach and bronchoscopy 10/5. PMHx: DM, HTN, HLD    PT Comments    Patient progressing with ability to ambulate some in room this session.  Was unable to manage walker and demonstrated poor awareness of LE positioning in walker.  Feel she is appropriate for CIR level rehab.  Will need continued skilled PT in acute setting until d/c.   Maintained on 40% trach collar throughout session with VSS, some coughing at end of session.   Follow Up Recommendations  CIR;Supervision/Assistance - 24 hour     Equipment Recommendations  Rolling walker with 5" wheels    Recommendations for Other Services       Precautions / Restrictions Precautions Precautions: Sternal;Fall Precaution Comments: trach, flexiseal    Mobility  Bed Mobility               General bed mobility comments: up in chair  Transfers Overall transfer level: Needs assistance Equipment used: Bilateral platform walker [place] walker) Transfers: Sit to/from Stand Sit to Stand: Mod assist;+2 physical assistance            Ambulation/Gait Ambulation/Gait assistance: +2 safety/equipment;Mod assist Ambulation Distance (Feet): 12 Feet Assistive device: Bilateral platform walker (Eva walker) Gait Pattern/deviations: Scissoring;Narrow base of support;Trunk flexed;Shuffle;Decreased stride length;Ataxic     General Gait Details: Difficulty maintaining walker straight, assist for walker management, for balance, cues for widening step width due to scissoring and stepping on her feet, heavy UE support on elbows with trunk  weakness, fatigued quickly  +2 assist for lines, chair to follow, safey with watching feet   Stairs            Wheelchair Mobility    Modified Rankin (Stroke Patients Only)       Balance Overall balance assessment: Needs assistance Sitting-balance support: Feet supported Sitting balance-Leahy Scale: Poor Sitting balance - Comments: minguard sitting edge of chair after walking   Standing balance support: Bilateral upper extremity supported Standing balance-Leahy Scale: Poor Standing balance comment: mod A for static standing with UE support                    Cognition Arousal/Alertness: Awake/alert Behavior During Therapy: WFL for tasks assessed/performed Overall Cognitive Status: Impaired/Different from baseline Area of Impairment: Attention;Following commands;Safety/judgement;Problem solving;Memory   Current Attention Level: Sustained   Following Commands: Follows one step commands with increased time Safety/Judgement: Decreased awareness of safety   Problem Solving: Slow processing;Requires verbal cues;Difficulty sequencing      Exercises General Exercises - Lower Extremity Ankle Circles/Pumps: AROM;15 reps;Seated;Both Other Exercises Other Exercises: leaning forward away from back of chair without UE support min A x 5    General Comments        Pertinent Vitals/Pain Pain Assessment: Faces Faces Pain Scale: No hurt    Home Living                      Prior Function            PT Goals (current goals can now be found in the care plan section) Progress towards PT goals: Progressing toward goals    Frequency  Min 3X/week      PT Plan Current plan remains appropriate    Co-evaluation             End of Session Equipment Utilized During Treatment: Oxygen;Gait belt Activity Tolerance: Patient limited by fatigue Patient left: in chair;with call bell/phone within reach;with nursing/sitter in room     Time:  0626-9485 PT Time Calculation (min) (ACUTE ONLY): 24 min  Charges:  $Gait Training: 8-22 mins $Therapeutic Activity: 8-22 mins                    G Codes:      [name] 10-08-16, 1:46 PM  Magda Kiel, [place] 08-Oct-2016

## 2016-09-20 NOTE — Progress Notes (Signed)
Patient ID: CHENEL WERNLI, female   DOB: 27-Jun-1956, 60 y.o.   MRN: [id number]   ADVANCED HF TEAM  60 y/o woman with DM2, HTN, HL and noncompliance presented to [facility] ER with infrerior posterior STEMI on 9/12. Taken to cath lab and found to have occluded small to moderate OM-1 branch. Underwent successful PCS with stent x 2 by Dr. Clayborn Bigness. Coronaries otherwise ok. EF 55-60% with severe MR confirmed by echo.   Post-cath developed respiratory failure and required intubation. Developed shock and renal failure with peak creatinine 3.4 (1.6 on admit). Transferred to [facility] for further management on 9/15.Marland Kitchen TEE showed normal LV (EF 60-65%) and normal RV function with ruptured posterior papillary muscle with severe posterior MR.  Underwent placement of Swan and IABP. S/P MVR 08/23/16.   Treated for group B Strep PNA.   Echo 9/23  RV and LV are normal EF 60%  Extubated 9/29.  However, picture consistent with septic shock on 9/30 so pressors started and she was re-intubated. S/P Trach 10/5.  Developed AFL S/P TEE DC-CV 09/04/16   Stable off inotropes. Todays CO-OX 58.5%. Remais in NSR. WBC trending up.  Respiratory culture- NGTD  Echo 09/17/16: Normal LV size with mild LV hypertrophy. EF 60-65%. Normal RV   size with mildly decreased systolic function. No evidence for   pulmonary hypertension. The interventricular septum is shifted   mildly to the left but there is no evidence for marked RV   abnormality. The bioprosthetic mitral valve appears to function   normally.  Scheduled Meds: . amiodarone  200 mg Oral BID  . atorvastatin  20 mg Oral q1800  . chlorhexidine  15 mL Mouth Rinse BID  . clopidogrel  75 mg Oral Daily  . darbepoetin (ARANESP) injection - NON-DIALYSIS  100 mcg Subcutaneous Q Mon-1800  . famotidine  20 mg Oral Daily  . feeding supplement (NEPRO CARB STEADY)  1,000 mL Per Tube Q24H  . feeding supplement (PRO-STAT SUGAR FREE 64)  30 mL Per Tube TID  . Gerhardt's butt cream   Topical  BID  . glycopyrrolate  1 mg Per Tube BID  . guaiFENesin  30 mL Per Tube Q6H  . insulin aspart  0-24 Units Subcutaneous Q4H  . insulin detemir  10 Units Subcutaneous BID  . mouth rinse  15 mL Mouth Rinse q12n4p  . midodrine  10 mg Oral TID WC  . multivitamin  15 mL Per Tube Daily  . QUEtiapine  25 mg Oral QHS  . Warfarin - Pharmacist Dosing Inpatient   Does not apply q1800   Continuous Infusions: . sodium chloride     PRN Meds:.albuterol, HYDROcodone-acetaminophen, ondansetron (ZOFRAN) IV, sodium chloride flush    Vitals:   09/20/16 0530 09/20/16 0600 09/20/16 0630 09/20/16 0700  BP: 121/70 113/68 122/66 109/69  Pulse: 72 72 73 71  Resp: 18 (!) 22 18 17   Temp:    97.5 F (36.4 C)  TempSrc:    Oral  SpO2: 100% 99% 100% 95%  Weight:      Height:        Intake/Output Summary (Last 24 hours) at 09/20/16 0927 Last data filed at 09/20/16 0700  Gross per 24 hour  Intake              965 ml  Output             3000 ml  Net            -2035 ml    LABS:  Basic Metabolic Panel:  Recent Labs  09/19/16 0345 09/20/16 0500  NA 134* 131*  K 3.2* 4.0  CL 98* 96*  CO2 28 25  GLUCOSE 160* 104*  BUN 44* 31*  CREATININE 2.60* 1.94*  CALCIUM 8.0* 8.3*  PHOS 3.0 1.8*   Liver Function Tests:  Recent Labs  09/19/16 0345 09/20/16 0500  ALBUMIN 2.2* 2.4*   No results for input(s): LIPASE, AMYLASE in the last 72 hours. CBC:  Recent Labs  09/19/16 0345 09/20/16 0500  WBC 18.6* 22.0*  HGB 8.1* 8.9*  HCT 26.1* 28.3*  MCV 93.9 92.5  PLT 380 400   Cardiac Enzymes: No results for input(s): CKTOTAL, CKMB, CKMBINDEX, TROPONINI in the last 72 hours. BNP: Invalid input(s): POCBNP D-Dimer: No results for input(s): DDIMER in the last 72 hours. Hemoglobin A1C: No results for input(s): HGBA1C in the last 72 hours. Fasting Lipid Panel: No results for input(s): CHOL, HDL, LDLCALC, TRIG, CHOLHDL, LDLDIRECT in the last 72 hours. Thyroid Function Tests: No results for input(s):  TSH, T4TOTAL, T3FREE, THYROIDAB in the last 72 hours.  Invalid input(s): FREET3 Anemia Panel: No results for input(s): VITAMINB12, FOLATE, FERRITIN, TIBC, IRON, RETICCTPCT in the last 72 hours.  PHYSICAL EXAM General: lying in bed, lots of trach secretions Neck: Trach, JVP difficult Lungs: Rhonchi throughout. Upper airway gugrling CV: Nondisplaced PMI.  Heart regular S1/S2, no S3/S4, 1/6 SEM USB.   Abdomen: Soft, NT, ND, no HSM. No bruits or masses. +BS  Neurologic: MAE. Follows commands.   Extremities: No clubbing or cyanosis. R and LLE SCDs.   TELEMETRY: NSR 70s     Assessment:   1. Cardiogenic shock: Due to acute severe MR. EF 70% on 9/25 echo.  2. CAD: Inferior posterior STEMI on 9/13 with DES x 2 to OM-1      --Plavix restarted on 9/22 3. 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     --s/p MVR 08/23/2016  4. AKI on CKD stage III - due to ATN likely from cardiogenic shock + contrast with cath. Has required CVVH.  5. Acute respiratory failure: Pulmonary edema.  6. DM2 7. Suspect component of septic shock: Possible PNA, group B Strep in sputum culture.  8. Anemia 9. PVCs   10. Shock liver 11. MRSA bacteremia 12. Atrial fibrillation: Paroxysmal.  On amiodarone.    Plan/Discussion:    Now off dobutamine. Co-ox 58.5%.  Keep off for now.   Tolerating iHD,   Maintaining NSR. INR 1.9. Pharmacy dosing coumadin.   Main issue today is increased pulmonary secretions. Sputum sample- NGTD.     Darrick Grinder, NP-C  9:27 AM  Patient seen and examined with Darrick Grinder, NP. We discussed all aspects of the encounter. I agree with the assessment and plan as stated above.   Stable from cardiac perspective. Co-ox and volume status ok. Maintaining NSR. On warfarin/plavix. Continues with increased respiratory secretions. WBC up. May need to expand abx coverage. CIR evaluating.   Bensimhon, Daniel,MD 11:21  AM

## 2016-09-20 NOTE — Progress Notes (Signed)
[facility] ROUNDING NOTE   Subjective:   Interval History: no change this AM  Objective:  Vital signs in last 24 hours:  Temp:  [97.4 F (36.3 C)-98.4 F (36.9 C)] 97.5 F (36.4 C) (10/18 0700) Pulse Rate:  [58-93] 71 (10/18 0700) Resp:  [16-30] 17 (10/18 0700) BP: (87-190)/(55-106) 109/69 (10/18 0700) SpO2:  [91 %-100 %] 95 % (10/18 0700) FiO2 (%):  [40 %] 40 % (10/18 0700) Weight:  [63.3 kg (139 lb 8.8 oz)-66.6 kg (146 lb 13.2 oz)] 63.3 kg (139 lb 8.8 oz) (10/18 0500)  Weight change: -1.5 kg (-3 lb 4.9 oz) Filed Weights   09/19/16 1354 09/19/16 1802 09/20/16 0500  Weight: 66.6 kg (146 lb 13.2 oz) 63.6 kg (140 lb 3.4 oz) 63.3 kg (139 lb 8.8 oz)    Intake/Output: I/O last 3 completed shifts: In: 1610 [Other:40; NG/GT:1530; IV Piggyback:100] Out: 3250 [Other:3000; Stool:250]   Intake/Output this shift:  No intake/output data recorded.  General: alert Neuro: Normal strength HEENT: Trach site clean Cardiovascular: Regular, 2/6 murmur Lungs: No wheeze Abdomen: Soft, non tender Musculoskeletal: 1+ edema Skin: No rashes   Basic Metabolic Panel:  Recent Labs Lab 09/16/16 0417 09/17/16 0350 09/18/16 0457 09/19/16 0345 09/20/16 0500  NA 135 132* 132* 134* 131*  K 3.9 3.8 4.0 3.2* 4.0  CL 97* 95* 95* 98* 96*  CO2 28 25 23 28 25   GLUCOSE 88 156* 138* 160* 104*  BUN 42* 67* 98* 44* 31*  CREATININE 2.39* 3.63* 4.39* 2.60* 1.94*  CALCIUM 8.5* 8.5* 9.0 8.0* 8.3*  PHOS 3.3 4.7* 5.9* 3.0 1.8*    Liver Function Tests:  Recent Labs Lab 09/16/16 0417 09/17/16 0350 09/18/16 0457 09/19/16 0345 09/20/16 0500  ALBUMIN 2.4* 2.3* 2.5* 2.2* 2.4*   No results for input(s): LIPASE, AMYLASE in the last 168 hours. No results for input(s): AMMONIA in the last 168 hours.  CBC:  Recent Labs Lab 09/16/16 0417 09/17/16 0345 09/18/16 0457 09/19/16 0345 09/20/16 0500  WBC 14.0* 14.7* 17.7* 18.6* 22.0*  HGB 8.2* 8.2* 8.4* 8.1* 8.9*  HCT 26.8*  26.7* 27.3* 26.1* 28.3*  MCV 94.4 95.0 93.5 93.9 92.5  PLT 411* 401* 413* 380 400    Cardiac Enzymes: No results for input(s): CKTOTAL, CKMB, CKMBINDEX, TROPONINI in the last 168 hours.  BNP: Invalid input(s): POCBNP  CBG:  Recent Labs Lab 09/19/16 1204 09/19/16 1932 09/19/16 2342 09/20/16 0356 09/20/16 0827  GLUCAP 188* 114* 54* 36* 177*    Microbiology: Results for orders placed or performed during the hospital encounter of 08/18/16  Surgical pcr screen     Status: None   Collection Time: 08/18/16  2:54 PM  Result Value Ref Range Status   MRSA, PCR NEGATIVE NEGATIVE Final   Staphylococcus aureus NEGATIVE NEGATIVE Final    Comment:        The Xpert SA Assay (FDA approved for NASAL specimens in patients over 57 years of age), is one component of a comprehensive surveillance program.  Test performance has been validated by [facility] for patients greater than or equal to 37 year old. It is not intended to diagnose infection nor to guide or monitor treatment.   Culture, respiratory (NON-Expectorated)     Status: None   Collection Time: 08/18/16  6:04 PM  Result Value Ref Range Status   Specimen Description TRACHEAL ASPIRATE  Final   Special Requests Normal  Final   Gram Stain   Final    MODERATE WBC PRESENT,BOTH PMN AND MONONUCLEAR  ABUNDANT GRAM POSITIVE COCCI IN PAIRS AND CHAINS RARE YEAST    Culture   Final    MODERATE STREPTOCOCCUS AGALACTIAE TESTING AGAINST S. AGALACTIAE NOT ROUTINELY PERFORMED DUE TO PREDICTABILITY OF AMP/PEN/VAN SUSCEPTIBILITY. FEW STAPHYLOCOCCUS AUREUS    Report Status 08/22/2016 FINAL  Final   Organism ID, Bacteria STAPHYLOCOCCUS AUREUS  Final      Susceptibility   Staphylococcus aureus - MIC*    CIPROFLOXACIN <=0.5 SENSITIVE Sensitive     ERYTHROMYCIN 0.5 SENSITIVE Sensitive     GENTAMICIN <=0.5 SENSITIVE Sensitive     OXACILLIN <=0.25 SENSITIVE Sensitive     TETRACYCLINE <=1 SENSITIVE Sensitive     VANCOMYCIN 1 SENSITIVE  Sensitive     TRIMETH/SULFA <=10 SENSITIVE Sensitive     CLINDAMYCIN <=0.25 SENSITIVE Sensitive     RIFAMPIN <=0.5 SENSITIVE Sensitive     Inducible Clindamycin NEGATIVE Sensitive     * FEW STAPHYLOCOCCUS AUREUS  Culture, blood (Routine X 2) w Reflex to ID Panel     Status: None   Collection Time: 08/18/16  6:50 PM  Result Value Ref Range Status   Specimen Description BLOOD RIGHT ANTECUBITAL  Final   Special Requests BOTTLES DRAWN AEROBIC AND ANAEROBIC 5CC  Final   Culture NO GROWTH 5 DAYS  Final   Report Status 08/23/2016 FINAL  Final  Culture, blood (Routine X 2) w Reflex to ID Panel     Status: None   Collection Time: 08/18/16  7:00 PM  Result Value Ref Range Status   Specimen Description BLOOD RIGHT HAND  Final   Special Requests BOTTLES DRAWN AEROBIC AND ANAEROBIC 3CC  Final   Culture NO GROWTH 5 DAYS  Final   Report Status 08/23/2016 FINAL  Final  Surgical pcr screen     Status: Abnormal   Collection Time: 08/22/16  8:32 PM  Result Value Ref Range Status   MRSA, PCR NEGATIVE NEGATIVE Final   Staphylococcus aureus POSITIVE (A) NEGATIVE Final    Comment:        The Xpert SA Assay (FDA approved for NASAL specimens in patients over 13 years of age), is one component of a comprehensive surveillance program.  Test performance has been validated by [facility] for patients greater than or equal to 87 year old. It is not intended to diagnose infection nor to guide or monitor treatment.   Culture, blood (routine x 2)     Status: Abnormal   Collection Time: 08/27/16  9:00 AM  Result Value Ref Range Status   Specimen Description BLOOD LEFT HAND  Final   Special Requests IN PEDIATRIC BOTTLE 3CC  Final   Culture  Setup Time   Final    GRAM POSITIVE COCCI IN CLUSTERS IN PEDIATRIC BOTTLE Organism ID to follow CRITICAL RESULT CALLED TO, READ BACK BY AND VERIFIED WITH: A. Pablo Lawrence.D. 16:15 08/29/16 ([email or web address])    Culture (A)  Final    STAPHYLOCOCCUS SPECIES (COAGULASE  NEGATIVE) THE SIGNIFICANCE OF ISOLATING THIS ORGANISM FROM A SINGLE SET OF BLOOD CULTURES WHEN MULTIPLE SETS ARE DRAWN IS UNCERTAIN. PLEASE NOTIFY THE MICROBIOLOGY DEPARTMENT WITHIN ONE WEEK IF SPECIATION AND SENSITIVITIES ARE REQUIRED.    Report Status 08/30/2016 FINAL  Final  Blood Culture ID Panel (Reflexed)     Status: Abnormal   Collection Time: 08/27/16  9:00 AM  Result Value Ref Range Status   Enterococcus species NOT DETECTED NOT DETECTED Final   Listeria monocytogenes NOT DETECTED NOT DETECTED Final   Staphylococcus species DETECTED (A) NOT DETECTED Final  Comment: CRITICAL RESULT CALLED TO, READ BACK BY AND VERIFIED WITH: A. Pablo Lawrence.D. 16:15 08/29/16 ([email or web address])    Staphylococcus aureus NOT DETECTED NOT DETECTED Final   Methicillin resistance DETECTED (A) NOT DETECTED Final    Comment: CRITICAL RESULT CALLED TO, READ BACK BY AND VERIFIED WITH: A. Pablo Lawrence.D. 16:15 08/29/16 ([email or web address])    Streptococcus species NOT DETECTED NOT DETECTED Final   Streptococcus agalactiae NOT DETECTED NOT DETECTED Final   Streptococcus pneumoniae NOT DETECTED NOT DETECTED Final   Streptococcus pyogenes NOT DETECTED NOT DETECTED Final   Acinetobacter baumannii NOT DETECTED NOT DETECTED Final   Enterobacteriaceae species NOT DETECTED NOT DETECTED Final   Enterobacter cloacae complex NOT DETECTED NOT DETECTED Final   Escherichia coli NOT DETECTED NOT DETECTED Final   Klebsiella oxytoca NOT DETECTED NOT DETECTED Final   Klebsiella pneumoniae NOT DETECTED NOT DETECTED Final   Proteus species NOT DETECTED NOT DETECTED Final   Serratia marcescens NOT DETECTED NOT DETECTED Final   Haemophilus influenzae NOT DETECTED NOT DETECTED Final   Neisseria meningitidis NOT DETECTED NOT DETECTED Final   Pseudomonas aeruginosa NOT DETECTED NOT DETECTED Final   Candida albicans NOT DETECTED NOT DETECTED Final   Candida glabrata NOT DETECTED NOT DETECTED Final   Candida krusei NOT DETECTED NOT DETECTED Final    Candida parapsilosis NOT DETECTED NOT DETECTED Final   Candida tropicalis NOT DETECTED NOT DETECTED Final  Culture, blood (routine x 2)     Status: None   Collection Time: 08/27/16  9:19 AM  Result Value Ref Range Status   Specimen Description BLOOD RIGHT ASSIST CONTROL  Final   Special Requests BOTTLES DRAWN AEROBIC ONLY 4CC  Final   Culture NO GROWTH 5 DAYS  Final   Report Status 09/01/2016 FINAL  Final  C difficile quick scan w PCR reflex     Status: None   Collection Time: 09/01/16  8:20 AM  Result Value Ref Range Status   C Diff antigen NEGATIVE NEGATIVE Final   C Diff toxin NEGATIVE NEGATIVE Final   C Diff interpretation No C. difficile detected.  Final  Culture, blood (Routine X 2) w Reflex to ID Panel     Status: None   Collection Time: 09/02/16 10:20 AM  Result Value Ref Range Status   Specimen Description BLOOD RIGHT ANTECUBITAL  Final   Special Requests BOTTLES DRAWN AEROBIC ONLY 5CC  Final   Culture NO GROWTH 5 DAYS  Final   Report Status 09/07/2016 FINAL  Final  Culture, blood (Routine X 2) w Reflex to ID Panel     Status: None   Collection Time: 09/02/16 10:25 AM  Result Value Ref Range Status   Specimen Description BLOOD BLOOD RIGHT HAND  Final   Special Requests BOTTLES DRAWN AEROBIC ONLY 5CC  Final   Culture NO GROWTH 5 DAYS  Final   Report Status 09/07/2016 FINAL  Final  Culture, respiratory (NON-Expectorated)     Status: None   Collection Time: 09/03/16  7:48 AM  Result Value Ref Range Status   Specimen Description TRACHEAL ASPIRATE  Final   Special Requests NONE  Final   Gram Stain   Final    ABUNDANT WBC PRESENT,BOTH PMN AND MONONUCLEAR RARE SQUAMOUS EPITHELIAL CELLS PRESENT NO ORGANISMS SEEN    Culture FEW ENTEROBACTER SPECIES  Final   Report Status 09/06/2016 FINAL  Final   Organism ID, Bacteria ENTEROBACTER SPECIES  Final      Susceptibility   Enterobacter species - MIC*    CEFAZOLIN >=64 RESISTANT  Resistant     CEFEPIME 2 RESISTANT Resistant      CEFTAZIDIME >=64 RESISTANT Resistant     CEFTRIAXONE >=64 RESISTANT Resistant     CIPROFLOXACIN <=0.25 SENSITIVE Sensitive     GENTAMICIN <=1 SENSITIVE Sensitive     IMIPENEM 1 SENSITIVE Sensitive     TRIMETH/SULFA <=20 SENSITIVE Sensitive     PIP/TAZO >=128 RESISTANT Resistant     * FEW ENTEROBACTER SPECIES  Culture, respiratory (NON-Expectorated)     Status: None   Collection Time: 09/14/16  8:37 AM  Result Value Ref Range Status   Specimen Description TRACHEAL ASPIRATE  Final   Special Requests NONE  Final   Gram Stain   Final    ABUNDANT WBC PRESENT,BOTH PMN AND MONONUCLEAR NO ORGANISMS SEEN    Culture FEW CANDIDA ALBICANS  Final   Report Status 09/17/2016 FINAL  Final  Culture, respiratory (NON-Expectorated)     Status: None (Preliminary result)   Collection Time: 09/19/16  1:00 PM  Result Value Ref Range Status   Specimen Description TRACHEAL ASPIRATE  Final   Special Requests NONE  Final   Gram Stain   Final    FEW WBC PRESENT, PREDOMINANTLY PMN FEW GRAM POSITIVE COCCI IN PAIRS AND CHAINS RARE GRAM POSITIVE COCCI IN CLUSTERS RARE YEAST    Culture PENDING  Incomplete   Report Status PENDING  Incomplete    Coagulation Studies:  Recent Labs  09/18/16 0457 09/19/16 0345 09/20/16 0519  LABPROT 30.4* 29.3* 22.1*  INR 2.84 2.71 1.90    Urinalysis: No results for input(s): COLORURINE, LABSPEC, PHURINE, GLUCOSEU, HGBUR, BILIRUBINUR, KETONESUR, PROTEINUR, UROBILINOGEN, NITRITE, LEUKOCYTESUR in the last 72 hours.  Invalid input(s): APPERANCEUR    Imaging: Dg Chest Port 1 View  Result Date: 09/19/2016 CLINICAL DATA:  Shortness of breath EXAM: PORTABLE CHEST 1 VIEW COMPARISON:  September 18, 2016 FINDINGS: Tracheostomy catheter tip is 5.8 cm above the carina. Dual-lumen central catheter tip is at the cavoatrial junction. Right subclavian catheter tip is also at the cavoatrial junction. Feeding tube tip is below the diaphragm. No pneumothorax. There is patchy  airspace consolidation in both lower lobes with bilateral pleural effusions. There is also patchy atelectasis in the left lower lobe peripherally, stable. There is cardiomegaly with pulmonary venous hypertension. Atherosclerotic calcification is noted in the aorta. The patient is status post mitral valve replacement. IMPRESSION: Tube and catheter positions as described without pneumothorax. There is a degree of congestive heart failure with bilateral lower lobe airspace consolidation consistent with either alveolar edema or superimposed pneumonia. Both entities may exist concurrently. Appearance is essentially stable compared to 1 day prior. Electronically Signed   By: Lowella Grip III M.D.   On: 09/19/2016 08:32     Medications:   . sodium chloride     . amiodarone  200 mg Oral BID  . atorvastatin  20 mg Oral q1800  . chlorhexidine  15 mL Mouth Rinse BID  . clopidogrel  75 mg Oral Daily  . darbepoetin (ARANESP) injection - NON-DIALYSIS  100 mcg Subcutaneous Q Mon-1800  . famotidine  20 mg Oral Daily  . feeding supplement (NEPRO CARB STEADY)  1,000 mL Per Tube Q24H  . feeding supplement (PRO-STAT SUGAR FREE 64)  30 mL Per Tube TID  . Gerhardt's butt cream   Topical BID  . glycopyrrolate  1 mg Per Tube BID  . guaiFENesin  30 mL Per Tube Q6H  . insulin aspart  0-24 Units Subcutaneous Q4H  . insulin detemir  10 Units  Subcutaneous BID  . mouth rinse  15 mL Mouth Rinse q12n4p  . midodrine  10 mg Oral TID WC  . multivitamin  15 mL Per Tube Daily  . QUEtiapine  25 mg Oral QHS  . Warfarin - Pharmacist Dosing Inpatient   Does not apply q1800   albuterol, HYDROcodone-acetaminophen, ondansetron (ZOFRAN) IV, sodium chloride flush  Assessment/ Plan:   Acute on chronic renal disease will continue with dialysis  S/p cath/MVR  CABG x 1 anuric and requiring dialysis   S/P STEMI  Diabetes reasonable  Anemia on aranesp  MRSA bacteremia     LOS: 33 Deborah [name] W @TODAY @11 :08 AM

## 2016-09-20 NOTE — Progress Notes (Signed)
Speech Language Pathology Treatment: [name] Speaking valve;Dysphagia  Patient Details Name: Deborah [name] MRN: [id number] DOB: 1956-10-21 Today's Date: 09/20/2016 Time: 1000-1027 SLP Time Calculation (min) (ACUTE ONLY): 27 min  Assessment / Plan / Recommendation Clinical Impression  Pt was upright in bed for PMV placement and PO trials. Pt tolerated PMV placement for 25 minutes with VS remaining stable throughout session. No secretions were expelled during tx, but SLP notes pt's cough strength as mildly improved. Her voice, while still low in volume and hoarse, was improved from previous sessions. She spoke with increased intelligibility at the word and phrase level. Trials of ice chips and thin liquid via tsp were given with PMV in place. Immediate coughing after thin liquid trials was observed. Recommend pt wear PMV with full supervision from staff. Remain NPO except for ice chips after oral care is provided. Will f/u for PMV tolerance and further PO trials.   HPI HPI: 60 yo female admitted to [facility] 08/15/16 with STEMI s/p cath to [place]. Developed cardiogenic shock, VDRF (ETT 9/14-9/29), AKI with metabolic acidosis and started on CRRT 08/17/16. Transferred to [facility] to tx shock and evaluated new severe MR from papillary muscle rupture. She also had fever and found to have Group B Strep in sputum culture.Mitral valve replacement 9/20. Swallow evaluation 9/30 with recs for NPO given poor mentation, severely impaired swallow function s/p prolonged intubation. Reintubated 9/30 secondary to septic shock; trach 10/5; new onset right hemiplegia and right gaze preference.  CT negative for acute changes; dx acute encephalopathy.       SLP Plan  Continue with current plan of care     Recommendations  Diet recommendations: NPO;Other(comment) (ice chips after oral care) Medication Administration: Via alternative means Supervision: Full supervision/cueing for compensatory strategies Postural Changes  and/or Swallow Maneuvers: Seated upright 90 degrees      Patient may use Passy-Muir Speech Valve: Intermittently with supervision;During all therapies with supervision PMSV Supervision: Full         Oral Care Recommendations: Oral care prior to ice chip/H20 Follow up Recommendations: Inpatient Rehab Plan: Continue with current plan of care       [place], Student SLP  Shela Leff 09/20/2016, 11:01 AM

## 2016-09-21 LAB — COOXEMETRY PANEL
Carboxyhemoglobin: 1.6 % — ABNORMAL HIGH (ref 0.5–1.5)
METHEMOGLOBIN: 1 % (ref 0.0–1.5)
O2 Saturation: 69.2 %
TOTAL HEMOGLOBIN: 8.7 g/dL — AB (ref 12.0–16.0)

## 2016-09-21 LAB — CBC
HCT: 28.1 % — ABNORMAL LOW (ref 36.0–46.0)
Hemoglobin: 8.9 g/dL — ABNORMAL LOW (ref 12.0–15.0)
MCH: 29.1 pg (ref 26.0–34.0)
MCHC: 31.7 g/dL (ref 30.0–36.0)
MCV: 91.8 fL (ref 78.0–100.0)
Platelets: 426 10*3/uL — ABNORMAL HIGH (ref 150–400)
RBC: 3.06 MIL/uL — ABNORMAL LOW (ref 3.87–5.11)
RDW: 16.6 % — ABNORMAL HIGH (ref 11.5–15.5)
WBC: 18.2 10*3/uL — ABNORMAL HIGH (ref 4.0–10.5)

## 2016-09-21 LAB — GLUCOSE, CAPILLARY
GLUCOSE-CAPILLARY: 100 mg/dL — AB (ref 65–99)
GLUCOSE-CAPILLARY: 248 mg/dL — AB (ref 65–99)
Glucose-Capillary: 192 mg/dL — ABNORMAL HIGH (ref 65–99)
Glucose-Capillary: 133 mg/dL — ABNORMAL HIGH (ref 65–99)

## 2016-09-21 LAB — RENAL FUNCTION PANEL
Albumin: 2.3 g/dL — ABNORMAL LOW (ref 3.5–5.0)
Anion gap: 12 (ref 5–15)
BUN: 71 mg/dL — ABNORMAL HIGH (ref 6–20)
CHLORIDE: 91 mmol/L — AB (ref 101–111)
CO2: 24 mmol/L (ref 22–32)
CREATININE: 3.34 mg/dL — AB (ref 0.44–1.00)
Calcium: 8.3 mg/dL — ABNORMAL LOW (ref 8.9–10.3)
GFR calc non Af Amer: 14 mL/min — ABNORMAL LOW (ref >60)
GFR, EST AFRICAN AMERICAN: 16 mL/min — AB (ref >60)
Glucose, Bld: 141 mg/dL — ABNORMAL HIGH (ref 65–99)
POTASSIUM: 4.1 mmol/L (ref 3.5–5.1)
Phosphorus: 3.3 mg/dL (ref 2.5–4.6)
Sodium: 127 mmol/L — ABNORMAL LOW (ref 135–145)

## 2016-09-21 LAB — PROTIME-INR
INR: 1.88
Prothrombin Time: 21.9 seconds — ABNORMAL HIGH (ref 11.4–15.2)

## 2016-09-21 LAB — CULTURE, RESPIRATORY W GRAM STAIN: Culture: NORMAL

## 2016-09-21 MED ORDER — WARFARIN SODIUM 5 MG PO TABS
5.0000 mg | ORAL_TABLET | Freq: Once | ORAL | Status: AC
  Administered 2016-09-21: 5 mg via ORAL
  Filled 2016-09-21: qty 1

## 2016-09-21 NOTE — Progress Notes (Signed)
PT Cancellation Note  Patient Details Name: Deborah [name] MRN: [id number] DOB: [birth year]   Cancelled Treatment:    Reason Eval/Treat Not Completed: Patient at procedure or test/unavailable (pt in HD and unavailable)   [name] 09/21/2016, 10:57 AM Elwyn Reach, [place]

## 2016-09-21 NOTE — Progress Notes (Signed)
ANTICOAGULATION CONSULT NOTE - Follow Up Consult  Pharmacy Consult for Coumadin Indication: Aflutter  No Known Allergies  Patient Measurements: Height: 5\' 1"  (154.9 cm) Weight: 139 lb 1.8 oz (63.1 kg) IBW/kg (Calculated) : 47.8  Vital Signs: Temp: 98 F (36.7 C) (10/19 1112) Temp Source: Oral (10/19 1112) BP: 137/68 (10/19 1201) Pulse Rate: 86 (10/19 1201)  Labs:  Recent Labs  09/19/16 0345 09/20/16 0500 09/20/16 0519 09/21/16 0417 09/21/16 0733  HGB 8.1* 8.9*  --  8.9*  --   HCT 26.1* 28.3*  --  28.1*  --   PLT 380 400  --  426*  --   LABPROT 29.3*  --  22.1* 21.9*  --   INR 2.71  --  1.90 1.88  --   CREATININE 2.60* 1.94*  --   --  3.34*    Estimated Creatinine Clearance: 15.4 mL/min (by C-G formula based on SCr of 3.34 mg/dL (H)).   Assessment: 60yof continues on coumadin for aflutter. INR trended up after just two doses of 5mg  (last dose given 10/12). INR trended down to 1.9 yesterday and coumadin was resumed. Today's INR 1.88. She continues on po amiodarone. CBC stable. No bleeding.  Goal of Therapy:  INR 2-2.5 Monitor platelets by anticoagulation protocol: Yes   Plan:  1) Coumadin 5mg  x 1 2) Daily INR  Deboraha Sprang 09/21/2016,12:10 PM

## 2016-09-21 NOTE — Progress Notes (Signed)
Patient ID: Deborah [name], female   DOB: Dec 25, 1955, 60 y.o.   MRN: [id number]   ADVANCED HF TEAM  SUBJECTIVE:  Remains weak. Off inotropes. Co-ox 69%, Walked 10 feet yesterday. Tolerating HD on midodrine. WBC improved. Approved for CIR   Scheduled Meds: . amiodarone  200 mg Oral BID  . atorvastatin  20 mg Oral q1800  . chlorhexidine  15 mL Mouth Rinse BID  . clopidogrel  75 mg Oral Daily  . darbepoetin (ARANESP) injection - NON-DIALYSIS  100 mcg Subcutaneous Q Mon-1800  . famotidine  20 mg Oral Daily  . feeding supplement (NEPRO CARB STEADY)  1,000 mL Per Tube Q24H  . feeding supplement (PRO-STAT SUGAR FREE 64)  30 mL Per Tube TID  . Gerhardt's butt cream   Topical BID  . glycopyrrolate  1 mg Per Tube BID  . guaiFENesin  30 mL Per Tube Q6H  . insulin aspart  0-24 Units Subcutaneous Q4H  . insulin detemir  10 Units Subcutaneous BID  . mouth rinse  15 mL Mouth Rinse q12n4p  . midodrine  10 mg Oral TID WC  . multivitamin  15 mL Per Tube Daily  . QUEtiapine  25 mg Oral QHS  . Warfarin - Pharmacist Dosing Inpatient   Does not apply q1800   Continuous Infusions: . sodium chloride     PRN Meds:.albuterol, HYDROcodone-acetaminophen, ondansetron (ZOFRAN) IV, sodium chloride flush    Vitals:   09/21/16 0712 09/21/16 0730 09/21/16 0800 09/21/16 0830  BP: (!) 122/55 (!) 96/48 112/65 (!) 123/54  Pulse: 74 75 84 80  Resp: (!) 23 20 20 19   Temp:      TempSrc:      SpO2: 100%   100%  Weight:      Height:        Intake/Output Summary (Last 24 hours) at 09/21/16 0915 Last data filed at 09/21/16 0700  Gross per 24 hour  Intake             1050 ml  Output              200 ml  Net              850 ml    LABS: Basic Metabolic Panel:  Recent Labs  09/20/16 0500 09/21/16 0733  NA 131* 127*  K 4.0 4.1  CL 96* 91*  CO2 25 24  GLUCOSE 104* 141*  BUN 31* 71*  CREATININE 1.94* 3.34*  CALCIUM 8.3* 8.3*  PHOS 1.8* 3.3   Liver Function Tests:  Recent Labs  09/20/16 0500  09/21/16 0733  ALBUMIN 2.4* 2.3*   No results for input(s): LIPASE, AMYLASE in the last 72 hours. CBC:  Recent Labs  09/20/16 0500 09/21/16 0417  WBC 22.0* 18.2*  HGB 8.9* 8.9*  HCT 28.3* 28.1*  MCV 92.5 91.8  PLT 400 426*   Cardiac Enzymes: No results for input(s): CKTOTAL, CKMB, CKMBINDEX, TROPONINI in the last 72 hours. BNP: Invalid input(s): POCBNP D-Dimer: No results for input(s): DDIMER in the last 72 hours. Hemoglobin A1C: No results for input(s): HGBA1C in the last 72 hours. Fasting Lipid Panel: No results for input(s): CHOL, HDL, LDLCALC, TRIG, CHOLHDL, LDLDIRECT in the last 72 hours. Thyroid Function Tests: No results for input(s): TSH, T4TOTAL, T3FREE, THYROIDAB in the last 72 hours.  Invalid input(s): FREET3 Anemia Panel: No results for input(s): VITAMINB12, FOLATE, FERRITIN, TIBC, IRON, RETICCTPCT in the last 72 hours.  PHYSICAL EXAM General: lying in bed, lots of trach secretions  Neck: Trach, JVP difficult Lungs: Rhonchi throughout. Upper airway gugrling CV: Nondisplaced PMI.  Heart regular S1/S2, no S3/S4, 1/6 SEM USB.   Abdomen: Soft, NT, ND, no HSM. No bruits or masses. +BS  Neurologic: MAE. Follows commands.   Extremities: No clubbing or cyanosis. R and LLE SCDs.   TELEMETRY: NSR 70s     Assessment:   1. Cardiogenic shock: Due to acute severe MR. EF 70% on 9/25 echo.  2. CAD: Inferior posterior STEMI on 9/13 with DES x 2 to OM-1      --Plavix restarted on 9/22 3. 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     --s/p MVR 08/23/2016  4. AKI on CKD stage III - due to ATN likely from cardiogenic shock + contrast with cath. Has required CVVH.  5. Acute respiratory failure: Pulmonary edema.  6. DM2 7. Suspect component of septic shock: Possible PNA, group B Strep in sputum culture.  8. Anemia 9. PVCs   10. Shock liver 11. MRSA bacteremia 12. Atrial fibrillation: Paroxysmal.   On amiodarone.    Plan/Discussion:     Stable from cardiac perspective. Co-ox and volume status ok. Maintaining NSR. On warfarin/plavix. WBC improving.  Approved for CIR. We will follow at a distance.   Elnora Quizon,MD 9:15 AM

## 2016-09-21 NOTE — Progress Notes (Signed)
Inpatient Rehabilitation  Met with patient to discuss team's recommendation for IP Rehab.  Shared booklets and answered questions.  Patient nodded in agreement when I asked if I could call her daughter to discuss discharge plan (per Rehab MD consult).  I call daughter [name] to discuss family support upon discharge. She stated that she would have a meeting with her siblings to discuss care and support for mom after her hospitalization.      Earlier in the day, I received call from [facility] requesting clarification as to whether or not we admit patient receiving oxygen at 40% SpO2 via trach collar, which we do as long patient's is not requiring frequent suctioning.  However, the barrier to an IP Rehab admission would be her renal function determination of acute vs. chronic.  RNCM updated.    Plan to continue to follow for post acute therapy needs, discharge supports, timing of medical readiness, and bed availability.  Please call with questions.    Carmelia Roller., CCC/SLP Admission Coordinator  [place]  Cell [phone]

## 2016-09-21 NOTE — Progress Notes (Signed)
Patient ID: Deborah [name], female   DOB: [birth year], 60 y.o.   MRN: [id number]  Hemodynamically stable  Tolerated HD this am.  sats 100% on TC.

## 2016-09-21 NOTE — Procedures (Signed)
I have seen and examined this patient and agree with the plan of care    K 4.1   CO2   24    Alb 2.3  Ca 8.3     Appears stable on dialysis  BP 115 / 75   Trach  Collar  Clear    Catheter  Left site clean and dry  Flows 400   Goal 2500 cc     Tiffny Gemmer W 09/21/2016, 8:30 AM

## 2016-09-21 NOTE — Progress Notes (Signed)
Occupational Therapy Treatment Patient Details Name: Deborah [name] MRN: [id number] DOB: 04-26-1956 Today's Date: 09/21/2016    History of present illness 60 yo admitted to [facility] 9/12 with STEMI develped cardiogenic shock with ETT and cRRT initiated on 9/14 after cath, transfer to [facility] 9/39, metabolic acidosis, underwent MVR 9/20, extubated 9/29, reintubated 9/30, 9/25-9/29 hemiplegia developed and resolved, trach and bronchoscopy 10/5. PMHx: DM, HTN, HLD   OT comments  Pt seen after HD and was very fatigued.  She performed bed mobility with mod A, and sat EOB x 12 mins with min guard assist to min A.  Pt adamantly refused to stand and became limp, with no assist when this was attempted. Will continue to follow   Follow Up Recommendations  CIR;Supervision/Assistance - 24 hour    Equipment Recommendations  3 in 1 bedside comode    Recommendations for Other Services Rehab consult    Precautions / Restrictions Precautions Precautions: Sternal;Fall Precaution Comments: trach, flexiseal Restrictions Weight Bearing Restrictions: Yes (Sternal precautions)       Mobility Bed Mobility Overal bed mobility: Needs Assistance Bed Mobility: Rolling;Sidelying to Sit;Sit to Sidelying Rolling: Mod assist Sidelying to sit: Max assist     Sit to sidelying: Mod assist General bed mobility comments: Assist to move LEs off bed and to lift trunk. She was able to lift LEs back onto bed with min A   Transfers                 General transfer comment: Pt adamantly refusing     Balance Overall balance assessment: Needs assistance Sitting-balance support: Feet supported Sitting balance-Leahy Scale: Poor Sitting balance - Comments: Pt sat EOB x 12 mins with min guard - min A.  Pt frequently coughing requing assist to maintain balance        Standing balance comment: Pt adamantly refused to stand due to fatigue.  Pt became limp and would not assist with attempt                     ADL Overall ADL's : Needs assistance/impaired                                              Vision                     Perception     Praxis      Cognition   Behavior During Therapy: WFL for tasks assessed/performed Overall Cognitive Status: Impaired/Different from baseline Area of Impairment: Attention;Following commands;Safety/judgement;Problem solving;Memory   Current Attention Level: Sustained Memory: Decreased recall of precautions;Decreased short-term memory  Following Commands: Follows one step commands with increased time Safety/Judgement: Decreased awareness of safety   Problem Solving: Slow processing;Requires verbal cues;Difficulty sequencing      Extremity/Trunk Assessment               Exercises     Shoulder Instructions       General Comments      Pertinent Vitals/ Pain       Pain Assessment: Faces Faces Pain Scale: No hurt  Home Living                                          Prior Functioning/Environment  Frequency  Min 3X/week        Progress Toward Goals  OT Goals(current goals can now be found in the care plan section)  Progress towards OT goals: Progressing toward goals     Plan Discharge plan remains appropriate    Co-evaluation                 End of Session Equipment Utilized During Treatment: Oxygen   Activity Tolerance Patient limited by fatigue   Patient Left in bed;with call bell/phone within reach;with family/visitor present;with bed alarm set   Nurse Communication Mobility status        Time: 6773-7366 OT Time Calculation (min): 23 min  Charges: OT General Charges $OT Visit: 1 Procedure OT Treatments $Therapeutic Activity: 23-37 mins  Alyna Stensland M 09/21/2016, 4:21 PM

## 2016-09-21 NOTE — H&P (Signed)
  Physical Medicine and Rehabilitation Admission H&P    CC: Debility due to multiple medical issues.   HPI:  Deborah [name] is a 60 y.o. female with history of T2DM, HTN, CKD stage III with medical non-compliance who was originally admitted to [facility] 08/15/16 with NSTEMI and underwent cardiac cath with stenting. History taken from chart review. She developed acute renal failure with metabolic acidosis and oliguria requiring CRRT. She went on to develop cardiogenic shock requiring intubation and was transferred to [facility] on 08/18/16 for treatment. 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 She underwent mitral valve repair by Van Trigt on 9/21.  Right IJ placed on 9/28 and HD initiated.  She was extubated on 9/29 but reintubated due to septic shock due to staph bacteremia and required pressors for support.  A flutter treated with TEE DCCV on 10/2. On  Coumadin and amiodarone for sinus tach with PAF.  She underwent tracheostomy on 10/5 and was weaned to ATC and tolerating PMSV for short periods of time. Has completed treatment for enterobacter HCAP and was decannualted on 10/25.  MBS done 10/23 showing moderate pharyngeal dysphagia--she was started on dysphagia 2, nectar liquids with chin tuck to prevent penetration.  LUE AV graft and tunneled diatek catheter placed on 10/30 as patient HD dependent. Has been waned off milrinone and is tolerating HD session with midodrine on board. She continues to have frequent productive cough as well as paroxysmal episodes with activity and has been treated with cipro due to  Concerns of bronchitis.  Follow up CXR done 11/5 due to episode of hypoxia/dyspnea and showed lungs to be clear. Panda removed due to concerns of aspiration. Follow up MBS done today and diet advanced to dysphagia 3, thin. Therapy ongoing and patient with weakness with balance and proprioceptive deficits, scissoring gait as well as dysphagia. CIR was  recommended for follow up therapy.     Review of Systems  HENT: Negative for hearing loss.   Eyes: Negative for blurred vision and double vision.  Respiratory: Positive for cough (does not feel that's its getting better) and sputum production.   Cardiovascular: Negative for chest pain, palpitations and leg swelling.  Gastrointestinal: Positive for constipation. Negative for abdominal pain, heartburn and nausea.  Genitourinary: Negative for dysuria.  Musculoskeletal: Negative for back pain, joint pain and myalgias.  Skin: Negative for itching and rash.  Neurological: Positive for weakness. Negative for dizziness (on and off), sensory change (tingling bilateral feet. Weakness RLE), focal weakness and headaches.  Psychiatric/Behavioral: The patient is nervous/anxious and has insomnia.   All other systems reviewed and are negative.    Past Medical History:  Diagnosis Date  . Diabetes mellitus   . Hyperlipidemia   . Hypertension     Past Surgical History:  Procedure Laterality Date  . AV FISTULA PLACEMENT Left 10/02/2016   Procedure: INSERTION OF ARTERIOVENOUS (AV) GORE-TEX GRAFT ARM;  Surgeon: Brandon Christopher Cain, MD;  Location: [facility] OR;  Service: Vascular;  Laterality: Left;  . CARDIAC CATHETERIZATION N/A 08/15/2016   Procedure: Left Heart Cath and Coronary Angiography;  Surgeon: Dwayne D Callwood, MD;  Location: [facility] INVASIVE CV LAB;  Service: Cardiovascular;  Laterality: N/A;  . CARDIAC CATHETERIZATION N/A 08/15/2016   Procedure: Coronary Stent Intervention;  Surgeon: Dwayne D Callwood, MD;  Location: [facility] INVASIVE CV LAB;  Service: Cardiovascular;  Laterality: N/A;  . CARDIAC CATHETERIZATION N/A 08/18/2016   Procedure: Right Heart Cath;  Surgeon: Daniel R Bensimhon,   MD;  Location: [facility] INVASIVE CV LAB;  Service: Cardiovascular;  Laterality: N/A;  . CARDIAC CATHETERIZATION N/A 08/18/2016   Procedure: IABP Insertion;  Surgeon: Daniel R Bensimhon, MD;  Location: [facility] INVASIVE CV LAB;   Service: Cardiovascular;  Laterality: N/A;  . CARDIAC CATHETERIZATION Right 08/23/2016   Procedure: CENTRAL LINE INSERTION RIGHT SUBCLAVIAN;  Surgeon: Peter Van Trigt, MD;  Location: [facility] OR;  Service: Open Heart Surgery;  Laterality: Right;  . ENDOVEIN HARVEST OF GREATER SAPHENOUS VEIN Left 08/23/2016   Procedure: ENDOVEIN HARVEST OF GREATER SAPHENOUS VEIN;  Surgeon: Peter Van Trigt, MD;  Location: [facility];  Service: Open Heart Surgery;  Laterality: Left;  . INSERTION OF DIALYSIS CATHETER Bilateral 08/31/2016   Procedure: INSERTION OF DIALYSIS CATHETER LEFT INTERNAL JUGULAR VEIN & INSERTION OF TRIPLE LUMEN RIGHT INTERNAL JUGULAR VEIN;  Surgeon: Christopher S Dickson, MD;  Location: [facility];  Service: Vascular;  Laterality: Bilateral;  . INTRAOPERATIVE TRANSESOPHAGEAL ECHOCARDIOGRAM N/A 08/23/2016   Procedure: INTRAOPERATIVE TRANSESOPHAGEAL ECHOCARDIOGRAM;  Surgeon: Peter Van Trigt, MD;  Location: [facility];  Service: Open Heart Surgery;  Laterality: N/A;  . MITRAL VALVE REPAIR N/A 08/23/2016   Procedure: MITRAL VALVE REPAIR (MVR) USING 25MM EDWARDS MAGNA EASE BIOPROSTHESIS MITRAL  VALVE;  Surgeon: Peter Van Trigt, MD;  Location: [facility] OR;  Service: Open Heart Surgery;  Laterality: N/A;  . VAGINAL DELIVERY     x 6    Family History  Problem Relation Age of Onset  . Hypertension Mother   . Diabetes Mother   . Breast cancer Sister     Social History:  Lives with son and helps manage his home. 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 Has multiple family member in [place].  She reports that she has never smoked. She has never used smokeless tobacco. She reports that she does not drink alcohol or use drugs.    Allergies: No Known Allergies    Medications Prior to Admission  Medication Sig Dispense Refill  . amLODipine (NORVASC) 10 MG tablet Take 1 tablet (10 mg total) by mouth daily. 90 tablet 0  . aspirin EC 81 MG tablet Take 81 mg by mouth every  other day.    . atorvastatin (LIPITOR) 20 MG tablet Take 1 tablet (20 mg total) by mouth daily. 30 tablet 1  . ranitidine (ZANTAC) 150 MG tablet TAKE ONE TABLET BY MOUTH TWICE DAILY 180 tablet 1  . [EXPIRED] ticagrelor (BRILINTA) 90 MG TABS tablet Take 1 tablet (90 mg total) by mouth 2 (two) times daily. 60 tablet 6  . [DISCONTINUED] metFORMIN (GLUCOPHAGE) 500 MG tablet Take 500 mg by mouth 2 (two) times daily with a meal.    . [DISCONTINUED] metoprolol (LOPRESSOR) 100 MG tablet Take 100 mg by mouth 2 (two) times daily.    . [DISCONTINUED] valsartan-hydrochlorothiazide (DIOVAN-HCT) 320-25 MG tablet Take 1 tablet by mouth daily.      Home: Home Living Family/patient expects to be discharged to:: Unsure Living Arrangements: Children (lived with son Franco Mirarchi PTA) Available Help at Discharge: Available 24 hours/day, Other (Comment) (pt. to stay with daughter [name] who will work from home) Type of Home: House Additional Comments: pt on vent and trach and unable to provide all PLOF and home environment at this time.    Functional History: Prior Function Level of Independence: Independent  Functional Status:  Mobility: Bed Mobility Overal bed mobility: Needs Assistance Bed Mobility: Supine to Sit, Sit to Supine Rolling: Min assist Sidelying to sit: Supervision,   HOB elevated Supine to sit: Min guard Sit to supine: Min guard Sit to sidelying: Min assist General bed mobility comments: cues for sequence to roll to side and elevate trunk, min assist for trunk elevation, HOB 20 degrees Transfers Overall transfer level: Needs assistance Equipment used: 1 person hand held assist Transfers: Sit to/from Stand, Stand Pivot Transfers Sit to Stand: Min assist Stand pivot transfers: Min assist General transfer comment: cues for hand placement and balance  Ambulation/Gait Ambulation/Gait assistance: Mod assist, +2 safety/equipment Ambulation Distance (Feet): 45 Feet Assistive device: Rolling  walker (2 wheeled) Gait Pattern/deviations: Step-through pattern, Decreased stride length, Scissoring General Gait Details: pt with variation in being too close and too far posterior in RW with scissoring and left lean at times. Assist for balance, control of RW and cues for safety and sequence. Chair with close follow as pt fatigues and sits with limted warning. Pt walked 45' x 2 trials  Gait velocity interpretation: Below normal speed for age/gender    ADL: ADL Overall ADL's : Needs assistance/impaired Eating/Feeding: NPO Grooming: Wash/dry hands, Wash/dry face, Minimal assistance, Standing Upper Body Bathing: Maximal assistance, Sitting Lower Body Bathing: Total assistance, Sit to/from stand Upper Body Dressing : Total assistance, Sitting Lower Body Dressing: Moderate assistance, Sit to/from stand Lower Body Dressing Details (indicate cue type and reason): Pt required min A to don/doff Lt sock.  Total A for Rt  Toilet Transfer: Moderate assistance, Ambulation, Comfort height toilet, BSC Toilet Transfer Details (indicate cue type and reason): Mod A to move into standing when fatigued  Toileting- Clothing Manipulation and Hygiene: Maximal assistance, Sit to/from stand Functional mobility during ADLs: Moderate assistance, Rolling walker General ADL Comments: Pt requires assist for balance and thoroughness   Cognition: Cognition Overall Cognitive Status: Impaired/Different from baseline Orientation Level: Oriented X4 Cognition Arousal/Alertness: Awake/alert Behavior During Therapy: Flat affect Overall Cognitive Status: Impaired/Different from baseline Area of Impairment: Attention, Safety/judgement, Problem solving Current Attention Level: Selective Memory: Decreased recall of precautions, Decreased short-term memory Following Commands: Follows one step commands consistently Safety/Judgement: Decreased awareness of safety Problem Solving: Difficulty sequencing, Requires verbal cues,  Requires tactile cues General Comments: Pt demonstrates poor safety awareness. unable to recall any precautions before during or after session despite education Difficult to assess due to: Tracheostomy (with PMSV 50% of session)   Blood pressure (!) 119/59, pulse 84, temperature 98.4 F (36.9 C), temperature source Oral, resp. rate 18, height 5' 1" (1.549 m), weight 58.1 kg (128 lb 1.4 oz), SpO2 98 %. Physical Exam  Nursing note and vitals reviewed. Constitutional: She is oriented to person, place, and time. She appears well-developed and well-nourished. No distress.  HENT:  Head: Normocephalic and atraumatic.  Mouth/Throat: Oropharynx is clear and moist.  Eyes: Conjunctivae and EOM are normal. Pupils are equal, round, and reactive to light.  Neck: Normal range of motion. Neck supple.  Cardiovascular: Normal rate and regular rhythm.   Respiratory: Effort normal. She has no wheezes. She exhibits tenderness.  Bouts of coughing thorough out the exam.  Upper airway sounds  GI: Soft. Bowel sounds are normal. She exhibits no distension. There is tenderness.  Musculoskeletal: She exhibits tenderness (LUE-AVG  tender to touch. NO warmth or edema. LLE donor site with resolving ecchymosis. ). She exhibits no edema.  Neurological: She is alert and oriented to person, place, and time.  Dysphonic Able to follow simple motor commands without difficulty.  Motor: 4/5 throughout Sensation intact to light touch  Skin: Skin is warm and dry. She is not   diaphoretic. No erythema.  Incision healing  Psychiatric: Her affect is blunt. She is slowed.    Results for orders placed or performed during the hospital encounter of 08/18/16 (from the past 48 hour(s))  Glucose, capillary     Status: Abnormal   Collection Time: 10/09/16  4:13 PM  Result Value Ref Range   Glucose-Capillary 54 (L) 65 - 99 mg/dL   Comment 1 Notify RN    Comment 2 Document in Chart   Glucose, capillary     Status: Abnormal    Collection Time: 10/09/16  4:50 PM  Result Value Ref Range   Glucose-Capillary 180 (H) 65 - 99 mg/dL   Comment 1 Notify RN    Comment 2 Document in Chart   Glucose, capillary     Status: Abnormal   Collection Time: 10/09/16  8:32 PM  Result Value Ref Range   Glucose-Capillary 234 (H) 65 - 99 mg/dL   Comment 1 Document in Chart   Glucose, capillary     Status: None   Collection Time: 10/09/16 11:49 PM  Result Value Ref Range   Glucose-Capillary 92 65 - 99 mg/dL  Glucose, capillary     Status: Abnormal   Collection Time: 10/10/16  3:46 AM  Result Value Ref Range   Glucose-Capillary 146 (H) 65 - 99 mg/dL   Comment 1 Document in Chart   Protime-INR     Status: Abnormal   Collection Time: 10/10/16  3:56 AM  Result Value Ref Range   Prothrombin Time 29.9 (H) 11.4 - 15.2 seconds   INR 2.78   CBC     Status: Abnormal   Collection Time: 10/10/16  3:56 AM  Result Value Ref Range   WBC 18.7 (H) 4.0 - 10.5 K/uL   RBC 3.99 3.87 - 5.11 MIL/uL   Hemoglobin 11.1 (L) 12.0 - 15.0 g/dL   HCT 34.6 (L) 36.0 - 46.0 %   MCV 86.7 78.0 - 100.0 fL   MCH 27.8 26.0 - 34.0 pg   MCHC 32.1 30.0 - 36.0 g/dL   RDW 16.2 (H) 11.5 - 15.5 %   Platelets 529 (H) 150 - 400 K/uL  Renal function panel     Status: Abnormal   Collection Time: 10/10/16  3:56 AM  Result Value Ref Range   Sodium 133 (L) 135 - 145 mmol/L   Potassium 4.0 3.5 - 5.1 mmol/L   Chloride 91 (L) 101 - 111 mmol/L   CO2 24 22 - 32 mmol/L   Glucose, Bld 132 (H) 65 - 99 mg/dL   BUN 82 (H) 6 - 20 mg/dL   Creatinine, Ser 5.88 (H) 0.44 - 1.00 mg/dL   Calcium 9.1 8.9 - 10.3 mg/dL   Phosphorus 7.2 (H) 2.5 - 4.6 mg/dL   Albumin 2.9 (L) 3.5 - 5.0 g/dL   GFR calc non Af Amer 7 (L) >60 mL/min   GFR calc Af Amer 8 (L) >60 mL/min    Comment: (NOTE) The eGFR has been calculated using the CKD EPI equation. This calculation has not been validated in all clinical situations. eGFR's persistently <60 mL/min signify possible Chronic Kidney Disease.     Anion gap 18 (H) 5 - 15  Parathyroid hormone, intact (no Ca)     Status: Abnormal   Collection Time: 10/10/16  3:56 AM  Result Value Ref Range   PTH 199 (H) 15 - 65 pg/mL    Comment: (NOTE) Performed At: [facility] [address] [place], [place] [id number] [place]   MD Ph:[phone]   Iron and TIBC     Status: Abnormal   Collection Time: 10/10/16  3:56 AM  Result Value Ref Range   Iron 22 (L) 28 - 170 ug/dL   TIBC 326 250 - 450 ug/dL   Saturation Ratios 7 (L) 10.4 - 31.8 %   UIBC 304 ug/dL  Ferritin     Status: None   Collection Time: 10/10/16  3:56 AM  Result Value Ref Range   Ferritin 272 11 - 307 ng/mL  Glucose, capillary     Status: Abnormal   Collection Time: 10/10/16  4:55 AM  Result Value Ref Range   Glucose-Capillary 130 (H) 65 - 99 mg/dL   Comment 1 Document in Chart   Glucose, capillary     Status: Abnormal   Collection Time: 10/10/16 11:10 AM  Result Value Ref Range   Glucose-Capillary 44 (LL) 65 - 99 mg/dL   Comment 1 Notify RN    Comment 2 Document in Chart   Glucose, capillary     Status: Abnormal   Collection Time: 10/10/16 11:34 AM  Result Value Ref Range   Glucose-Capillary 51 (L) 65 - 99 mg/dL   Comment 1 Notify RN    Comment 2 Document in Chart   Glucose, capillary     Status: Abnormal   Collection Time: 10/10/16 12:26 PM  Result Value Ref Range   Glucose-Capillary 208 (H) 65 - 99 mg/dL   Comment 1 Notify RN    Comment 2 Document in Chart   Glucose, capillary     Status: Abnormal   Collection Time: 10/10/16  4:21 PM  Result Value Ref Range   Glucose-Capillary 144 (H) 65 - 99 mg/dL   Comment 1 Notify RN    Comment 2 Document in Chart   Glucose, capillary     Status: Abnormal   Collection Time: 10/10/16  8:17 PM  Result Value Ref Range   Glucose-Capillary 260 (H) 65 - 99 mg/dL  Glucose, capillary     Status: Abnormal   Collection Time: 10/11/16 12:09 AM  Result Value Ref Range   Glucose-Capillary 169 (H) 65 - 99 mg/dL    Protime-INR     Status: Abnormal   Collection Time: 10/11/16  3:48 AM  Result Value Ref Range   Prothrombin Time 34.8 (H) 11.4 - 15.2 seconds   INR 3.35   Renal function panel     Status: Abnormal   Collection Time: 10/11/16  3:48 AM  Result Value Ref Range   Sodium 133 (L) 135 - 145 mmol/L   Potassium 3.4 (L) 3.5 - 5.1 mmol/L   Chloride 94 (L) 101 - 111 mmol/L   CO2 26 22 - 32 mmol/L   Glucose, Bld 53 (L) 65 - 99 mg/dL   BUN 22 (H) 6 - 20 mg/dL   Creatinine, Ser 3.44 (H) 0.44 - 1.00 mg/dL    Comment: DELTA CHECK NOTED   Calcium 8.5 (L) 8.9 - 10.3 mg/dL   Phosphorus 5.3 (H) 2.5 - 4.6 mg/dL   Albumin 2.6 (L) 3.5 - 5.0 g/dL   GFR calc non Af Amer 14 (L) >60 mL/min   GFR calc Af Amer 16 (L) >60 mL/min    Comment: (NOTE) The eGFR has been calculated using the CKD EPI equation. This calculation has not been validated in all clinical situations. eGFR's persistently <60 mL/min signify possible Chronic Kidney Disease.    Anion gap 13 5 - 15  CBC     Status: Abnormal     Collection Time: 10/11/16  3:48 AM  Result Value Ref Range   WBC 15.1 (H) 4.0 - 10.5 K/uL   RBC 3.81 (L) 3.87 - 5.11 MIL/uL   Hemoglobin 10.3 (L) 12.0 - 15.0 g/dL   HCT 33.5 (L) 36.0 - 46.0 %   MCV 87.9 78.0 - 100.0 fL   MCH 27.0 26.0 - 34.0 pg   MCHC 30.7 30.0 - 36.0 g/dL   RDW 16.2 (H) 11.5 - 15.5 %   Platelets 475 (H) 150 - 400 K/uL  Glucose, capillary     Status: Abnormal   Collection Time: 10/11/16  4:16 AM  Result Value Ref Range   Glucose-Capillary 55 (L) 65 - 99 mg/dL  Glucose, capillary     Status: Abnormal   Collection Time: 10/11/16  4:50 AM  Result Value Ref Range   Glucose-Capillary 150 (H) 65 - 99 mg/dL  Glucose, capillary     Status: None   Collection Time: 10/11/16  8:01 AM  Result Value Ref Range   Glucose-Capillary 96 65 - 99 mg/dL  Glucose, capillary     Status: Abnormal   Collection Time: 10/11/16 11:29 AM  Result Value Ref Range   Glucose-Capillary 194 (H) 65 - 99 mg/dL         Medical Problem List and Plan: 1.  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 2.  DVT Prophylaxis/Anticoagulation: Pharmaceutical: Coumadin 3. Pain Management: Hydrocodone prn effective. 4. Mood: LCSW to follow for evaluation and support.  5. Neuropsych: This patient is not fully capable of making decisions on her own behalf. 6. Skin/Wound Care: Routine pressure relief measures.  7. Fluids/Electrolytes/Nutrition: Strict I/O. Renal panel TTSa with HD.   Advance diet as tolerated 8. Leucocytosis: WBC continues to fluctuate--up to 17.0 today  On cipro for bronchitis  10 T2DM:Hgb A1c- 13.3. Currently on Levemir bid --diet advanced to regular with thin liquids. Continues to have hypoglycemic episodes daily due to variable intake. Will monitor blood sugars ac/hs with SSI for elevated BS 11. ESRD: Now HD dependent--to schedule HD in afternoons to help with tolerance of therapy during the day. Educate patient and family on renal diet. Electrolyte abnormalities being managed during HD.  12. Anemia of chronic disease: H/H ranging between 9.5 to 10.6. Monitor for signs of bleeding. On aranesp for erythropoiesis and nulecit for iron load 13. CAD s/p DES X 2 and MVR: Monitor for symptoms with increase in activity. On plavix and Lipitor.  14. Acute respiratory failure due to acute pulmonary edema:  Fluid overload compensated with HD.  15. AFlutter s/p DCCV/PAF: HR controlled on amiodarone. On coumadin.  16. Persistent cough with dysphonia: Off antibiotics. Continue robinul for secretions and Robitussin DM for cough. Encourage IS with use of flutter valve 17. Sleep disturbance: managed with benadryl prn    Post Admission Physician Evaluation: Functional deficits secondary  to debility from multiply cardial and pulmonary issues.. 1. Patient is admitted to receive collaborative, interdisciplinary care between the  physiatrist, rehab nursing staff, and therapy team. 2. Patient's level of medical complexity and substantial therapy needs in context of that medical necessity cannot be provided at a lesser intensity of care such as a SNF. 3. Patient has experienced substantial functional loss from his/her baseline which was documented above under the "Functional History" and "Functional Status" headings.  Judging by the patient's diagnosis, physical exam, and functional history, the patient has potential for functional progress which will result in measurable gains while on  inpatient rehab.  These gains will be of substantial and practical use upon discharge  in facilitating mobility and self-care at the household level. 4. Physiatrist will provide 24 hour management of medical needs as well as oversight of the therapy plan/treatment and provide guidance as appropriate regarding the interaction of the two. 5. 24 hour rehab nursing will assist with bladder management, bowel management, safety, skin/wound care, disease management, medication administration, pain management and patient education  and help integrate therapy concepts, techniques,education, etc. 6. PT will assess and treat for/with: Lower extremity strength, range of motion, stamina, balance, functional mobility, safety, adaptive techniques and equipment, woundcare, coping skills, pain control, cardiac education.   Goals are: Supervision/Min A. 7. OT will assess and treat for/with: ADL's, functional mobility, safety, upper extremity strength, adaptive techniques and equipment, wound mgt, ego support, and community reintegration.   Goals are: Supervision/Min A. Therapy may not proceed with showering this patient. 8. SLP will assess and treat for/with: cognition, swallowing, speech.  Goals are: Supervision/Mod I. 9. Case Management and Social Worker will assess and treat for psychological issues and discharge planning. 10. Team conference will be held weekly to  assess progress toward goals and to determine barriers to discharge. 11. Patient will receive at least 3 hours of therapy per day at least 5 days per week. 12. ELOS: 18-21 days.       13. Prognosis:  good  Delice Lesch, MD, Mellody Drown 10/11/2016

## 2016-09-21 NOTE — Care Management Note (Signed)
Case Management Note  Patient Details  Name: ZIYON CEDOTAL MRN: [id number] Date of Birth: [birth year]   Subjective/Objective:    Copied from previous CM note from [place] 08/17/16            RNCM met with patient's son [name] [phone] and daughter [name] [phone] to discuss discharge planning. Patient is vented currently and son [name] is struggling with patient's current status. I and patient's RN spoke with him to try to comfort him- he appreciate it. [name] would like to be first line of contact for any medical needs. Patient is from home with [name]. She was independent with daily activity. Her PCP is Ronette Deter. Patient had been going to [facility] but per [name] "they were not meeting her mother's medical needs". [name] has been paying for physician visits for her mother. Patient is potentially going to need outpatient dialysis. She was not taking her medications per [name] due to cost- patient does not have health insurance. [name] wants her mother to have home health services- charity through [facility] for nursing may be appropriate however it is too soon to make those arrangements. Both children are considering SNF if patient too weak to return home. They have met with financial advisors about Medicare and Medicaid per Teresa/Frank.   Action/Plan: Application to [facility] and Medication Management shared with [name]. RNCM to continue to follow.   Expected Discharge Date:                  Expected Discharge Plan:  [place]  In-House Referral:     Discharge planning Services     Post Acute Care Choice:    Choice offered to:     DME Arranged:    DME Agency:     HH Arranged:    [place] Agency:     Status of Service:  In process, will continue to follow  If discussed at Long Length of Stay Meetings, dates discussed:    Additional Comments: 09/21/2016  Discussed in LOS 09/21/16:  Pt remains appropriate for continued  stay Pt is now off all pressors, walked 10 feet yesterday however remains on 40% TC.  CIR actively following for possible disposition, CSW remains consulted for SNF placement as back up plan.  CM communicated with covering CSW regarding pt nearing discharge.  09/14/16 Plan:  Cont ATC 24/7. 28% FiO2 Trach collar 28% since 09/11/16, plan to change to cuffless trach 10/13 and consider downsizing.  SLP will eval for swallow and PMV once trach changed. Should be a candidate for decannulation in near future.  Both CIR and SNF following for discharge disposition   09/12/16 Pt recommended for CIR - CSW consulted placed as backup.  CM requested CIR consult via sticky notes.  09/04/16 Elenor Quinones, RN, BSN [phone] Pt is now extubated on [place], remains on CRRT, continuous Milrinone, Amiodarone, Precedex d/c today at extubation.   Per Development worker, community note ; medicaid application has been submitted  09/21/2016  CM spoke in depth with sister and brother in law - they feel that pt will beneift from post acute care facility due to lack of help in the home.  Per sister; pt stays with adult children but projected required help at home will not be sufficient at discharge.  CM discussed with family post acute discharge options including brief overview or LTACH and SNF in general (choice not given).  CM informed family that discharge determination  will be followed closely and will be managed daily depending on progress.  Pt is now on vent, CRRT, pressors.  09/01/16 Elenor Quinones, RN, BSN [phone] Discussed in LOS 08/29/16:  Pt remains appropriate for continued stay.  Pt remains on CRRT and ventilator.  Per attending; If no significant improvement in 24-48hrs, anticipate pt will need a trache/PEG.  She still has volume issues and her decreased mental status make it difficult to wean/extubate safely.  08/24/16 Elenor Quinones, RN, BSN [phone] Pt now transferred to 2S s/p  MVR.  Postoperative  management of balloon pump, vasopressors, and chest tube per CV S and cardiology. Patient remains critically ill on ventilator - may need CRRT.  CM will continue to follow for discharge needs Maryclare Labrador, RN 09/21/2016, 10:38 AM

## 2016-09-22 DIAGNOSIS — J988 Other specified respiratory disorders: Secondary | ICD-10-CM

## 2016-09-22 LAB — GLUCOSE, CAPILLARY
GLUCOSE-CAPILLARY: 199 mg/dL — AB (ref 65–99)
GLUCOSE-CAPILLARY: 115 mg/dL — AB (ref 65–99)
Glucose-Capillary: 112 mg/dL — ABNORMAL HIGH (ref 65–99)
Glucose-Capillary: 185 mg/dL — ABNORMAL HIGH (ref 65–99)
Glucose-Capillary: 198 mg/dL — ABNORMAL HIGH (ref 65–99)
Glucose-Capillary: 218 mg/dL — ABNORMAL HIGH (ref 65–99)
Glucose-Capillary: 64 mg/dL — ABNORMAL LOW (ref 65–99)
Glucose-Capillary: 70 mg/dL (ref 65–99)
Glucose-Capillary: 92 mg/dL (ref 65–99)

## 2016-09-22 LAB — CBC
HCT: 27.7 % — ABNORMAL LOW (ref 36.0–46.0)
Hemoglobin: 8.5 g/dL — ABNORMAL LOW (ref 12.0–15.0)
MCH: 28.1 pg (ref 26.0–34.0)
MCHC: 30.7 g/dL (ref 30.0–36.0)
MCV: 91.4 fL (ref 78.0–100.0)
Platelets: 364 10*3/uL (ref 150–400)
RBC: 3.03 MIL/uL — ABNORMAL LOW (ref 3.87–5.11)
RDW: 16.4 % — ABNORMAL HIGH (ref 11.5–15.5)
WBC: 16.1 10*3/uL — ABNORMAL HIGH (ref 4.0–10.5)

## 2016-09-22 LAB — HEPATITIS B SURFACE ANTIGEN: HEP B S AG: NEGATIVE

## 2016-09-22 LAB — COOXEMETRY PANEL
Carboxyhemoglobin: 1.9 % — ABNORMAL HIGH (ref 0.5–1.5)
Methemoglobin: 0.9 % (ref 0.0–1.5)
O2 Saturation: 85.8 %
TOTAL HEMOGLOBIN: 8.7 g/dL — AB (ref 12.0–16.0)

## 2016-09-22 LAB — PROTIME-INR
INR: 2.99
PROTHROMBIN TIME: 31.7 s — AB (ref 11.4–15.2)

## 2016-09-22 MED ORDER — WARFARIN SODIUM 1 MG PO TABS
1.0000 mg | ORAL_TABLET | Freq: Once | ORAL | Status: AC
  Administered 2016-09-22: 1 mg via ORAL
  Filled 2016-09-22: qty 1

## 2016-09-22 NOTE — Progress Notes (Signed)
Occupational Therapy Treatment Patient Details Name: Deborah [name] MRN: [id number] DOB: [birth year] Today's Date: 09/22/2016    History of present illness 60 yo admitted to [facility] 9/12 with STEMI develped cardiogenic shock with ETT and cRRT initiated on 9/14 after cath, transfer to [facility] 2/58, metabolic acidosis, underwent MVR 9/20, extubated 9/29, reintubated 9/30, 9/25-9/29 hemiplegia developed and resolved, trach and bronchoscopy 10/5. PMHx: DM, HTN, HLD   OT comments  Pt demonstrates improving activity tolerance.  She was able to stand x 3 with OT thi s pm.  She initially was agreeable to ambulate some and attempt some simple ADL tasks, but flexi seal dislodged requiring pt to remain in standing during clean up and re-insertion.  She then fatigued and transferred to the recliner.  She was, however, able to actively participate for 25 mins with 3  Short rest breaks.    Follow Up Recommendations    CIR   Equipment Recommendations  3 in 1 bedside comode    Recommendations for Other Services Rehab consult    Precautions / Restrictions Precautions Precautions: Sternal;Fall Precaution Comments: trach, flexiseal, panda       Mobility Bed Mobility Overal bed mobility: Needs Assistance Bed Mobility: Sidelying to Sit   Sidelying to sit: Min assist       General bed mobility comments: min A to guide pt and to lift trunk   Transfers Overall transfer level: Needs assistance Equipment used: Bilateral platform walker Transfers: Sit to/from Stand;Stand Pivot Transfers Sit to Stand: Mod assist;+2 physical assistance Stand pivot transfers: Mod assist;+2 physical assistance       General transfer comment: Pt stood x 3.  She stood first time with assist to lift hips from bed.  Flexi seal was dislodged and pt returned to sitting.  Second attempt, she stood with assist to lift hips an to extend hips.  Third attempt, she required assist for all aspects due to fatigue     Balance Overall  balance assessment: Needs assistance Sitting-balance support: Feet supported Sitting balance-Leahy Scale: Poor Sitting balance - Comments: Pt sat EOB with min guard assist.  As she fatigued, she required min A    Standing balance support: Bilateral upper extremity supported Standing balance-Leahy Scale: Poor                     ADL Overall ADL's : Needs assistance/impaired                         Toilet Transfer: Moderate assistance;Stand-pivot;BSC;RW Toilet Transfer Details (indicate cue type and reason): as pt fatigues, she scisorrs LEs during transfers and requires assist to pivot hips  Toileting- Clothing Manipulation and Hygiene: Total assistance;Sit to/from stand       Functional mobility during ADLs: Moderate assistance;+2 for physical assistance General ADL Comments: Pt stood first time and flexi seal came out.  Pt then stood for ~6 mins while flexi seal was replaced by RN and peri area cleaned up.         Vision                     Perception     Praxis      Cognition   Behavior During Therapy: Flat affect Overall Cognitive Status: Impaired/Different from baseline Area of Impairment: Attention;Memory;Problem solving   Current Attention Level: Sustained Memory: Decreased recall of precautions;Decreased short-term memory  Following Commands: Follows one step commands with increased time Safety/Judgement: Decreased awareness of safety  Problem Solving: Slow processing;Requires verbal cues;Difficulty sequencing      Extremity/Trunk Assessment               Exercises     Shoulder Instructions       General Comments      Pertinent Vitals/ Pain       Pain Assessment: Faces Faces Pain Scale: Hurts little more Pain Location: chest  Pain Descriptors / Indicators: Aching;Grimacing;Guarding Pain Intervention(s): Monitored during session;Repositioned  Home Living                                           Prior Functioning/Environment              Frequency  Min 3X/week        Progress Toward Goals  OT Goals(current goals can now be found in the care plan section)  Progress towards OT goals: Progressing toward goals     Plan Discharge plan remains appropriate    Co-evaluation                 End of Session Equipment Utilized During Treatment: Oxygen   Activity Tolerance Patient tolerated treatment well   Patient Left in chair;with call bell/phone within reach;with family/visitor present   Nurse Communication Mobility status        Time: 7510-2585 OT Time Calculation (min): 26 min  Charges: OT General Charges $OT Visit: 1 Procedure OT Treatments $Therapeutic Activity: 23-37 mins  Krystan Northrop M 09/22/2016, 4:48 PM

## 2016-09-22 NOTE — Clinical Social Work Placement (Signed)
   CLINICAL SOCIAL WORK PLACEMENT  NOTE  Date:  09/22/2016  Patient Details  Name: Deborah [name] MRN: [id number] Date of Birth: [birth year]  Clinical Social Work is seeking post-discharge placement for this patient at the [place] level of care (*CSW will initial, date and re-position this form in  chart as items are completed):  Yes   Patient/family provided with [place] Work Department's list of facilities offering this level of care within the geographic area requested by the patient (or if unable, by the patient's family).  Yes   Patient/family informed of their freedom to choose among providers that offer the needed level of care, that participate in Medicare, Medicaid or managed care program needed by the patient, have an available bed and are willing to accept the patient.  Yes   Patient/family informed of [facility]'s ownership interest in [facility] and [facility], as well as of the fact that they are under no obligation to receive care at these facilities.  PASRR submitted to EDS on 09/22/16     PASRR number received on 09/22/16     Existing PASRR number confirmed on       FL2 transmitted to all facilities in geographic area requested by pt/family on 09/22/16     FL2 transmitted to all facilities within larger geographic area on       Patient informed that his/her managed care company has contracts with or will negotiate with certain facilities, including the following:            Patient/family informed of bed offers received.  Patient chooses bed at       Physician recommends and patient chooses bed at      Patient to be transferred to   on  .  Patient to be transferred to facility by       Patient family notified on   of transfer.  Name of family member notified:        PHYSICIAN Please sign FL2     Additional Comment:    _______________________________________________ Candie Chroman, LCSW 09/22/2016,  11:59 AM

## 2016-09-22 NOTE — Progress Notes (Signed)
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 valve but not ready to take by mouth nutrition per speech therapy because of choking while swallowing Patient remains on HD followed by renal with a tunneled dialysis catheter Surgical incisions healed Biggest issue at this point is placement as patient still needs tracheostomy and is on HD When patient's palmar he secretions clear the #4 tracheostomy can be removed, possibly next week  Objective: Vital signs in last 24 hours: Temp:  [97.7 F (36.5 C)-98.4 F (36.9 C)] 98.4 F (36.9 C) (10/20 1304) Pulse Rate:  [75-96] 79 (10/20 1103) Cardiac Rhythm: Normal sinus rhythm (10/19 2000) Resp:  [12-24] 18 (10/20 1103) BP: (86-150)/(53-86) 150/81 (10/20 1103) SpO2:  [94 %-100 %] 100 % (10/20 1103) FiO2 (%):  [28 %-40 %] 28 % (10/20 1103) Weight:  [141 lb 1.5 oz (64 kg)] 141 lb 1.5 oz (64 kg) (10/20 0500)  Hemodynamic parameters for last 24 hours:  stable  Intake/Output from previous day: 10/19 0701 - 10/20 0700 In: 595 [NG/GT:595] Out: 2550 [Stool:50] Intake/Output this shift: No intake/output data recorded.       Exam    General- alert and comfortable   Lungs- clear without rales, wheezes   Cor- regular rate and rhythm, no murmur , gallop   Abdomen- soft, non-tender   Extremities - warm, non-tender, minimal edema   Neuro- oriented, appropriate, no  focal weakness   Lab Results:  Recent Labs  09/21/16 0417 09/22/16 0505  WBC 18.2* 16.1*  HGB 8.9* 8.5*  HCT 28.1* 27.7*  PLT 426* 364   BMET:  Recent Labs  09/20/16 0500 09/21/16 0733  NA 131* 127*  K 4.0 4.1  CL 96* 91*  CO2 25 24  GLUCOSE 104* 141*  BUN 31* 71*  CREATININE 1.94* 3.34*  CALCIUM 8.3* 8.3*    PT/INR:  Recent Labs  09/22/16 0505  LABPROT 31.7*  INR 2.99   ABG    Component Value Date/Time   PHART 7.541 (H) 09/08/2016 0412   HCO3 25.2 09/08/2016 0412   TCO2 26 09/08/2016 0412   ACIDBASEDEF 4.1 (H) 09/02/2016 1850   O2SAT 85.8 09/22/2016 0503   CBG (last 3)   Recent Labs  09/22/16 0605 09/22/16 0826 09/22/16 1302  GLUCAP 92 112* 199*    Assessment/Plan: S/P Procedure(s) (LRB): INSERTION OF DIALYSIS CATHETER LEFT INTERNAL JUGULAR VEIN & INSERTION OF TRIPLE LUMEN RIGHT INTERNAL JUGULAR VEIN (Bilateral) Continue current care Not ready to remove tracheostomy due to persistent secretions Hold Coumadin tonight-goal is INR 2.0-2.5  LOS: 35 days    Deborah [name] 09/22/2016

## 2016-09-22 NOTE — Progress Notes (Signed)
Physical Therapy Treatment Patient Details Name: ZALEAH TERNES MRN: [id number] DOB: 12-06-1955 Today's Date: 09/22/2016    History of Present Illness 60 yo admitted to [facility] 9/12 with STEMI develped cardiogenic shock with ETT and cRRT initiated on 9/14 after cath, transfer to [facility] 9/02, metabolic acidosis, underwent MVR 9/20, extubated 9/29, reintubated 9/30, 9/25-9/29 hemiplegia developed and resolved, trach and bronchoscopy 10/5. PMHx: DM, HTN, HLD    PT Comments    Pt with flat affect but in good spirit and willing to attempt ambulation today. Pt able to perform 2 walking trials and improved standing after 3 trials. Pt educated for sternal precautions, transfers, gait and progression . Will continue to follow and recommend CIR. Goals updated. Encouraged daily OOB and HEP with nursing assist.   sats 100% on 40% FIO2 trach collar HR 80-85 BP 108/70 before, 123/72 after mobility   Follow Up Recommendations  CIR;Supervision/Assistance - 24 hour     Equipment Recommendations       Recommendations for Other Services       Precautions / Restrictions Precautions Precautions: Sternal;Fall Precaution Comments: trach, flexiseal, panda    Mobility  Bed Mobility Overal bed mobility: Needs Assistance Bed Mobility: Sit to Sidelying         Sit to sidelying: Mod assist;+2 for safety/equipment General bed mobility comments: cues for sequence with assist to control trunk to surface and fully elevate legs back onto bed  Transfers Overall transfer level: Needs assistance   Transfers: Sit to/from Stand Sit to Stand: Max assist;+2 physical assistance         General transfer comment: pt stood x 3 from chair with max assist x 2 and mod assist x 1. cues and assist for anterior translation and to elevate sacrum. Increased time to achieve fully upright, Bil UE assist in standing  Ambulation/Gait Ambulation/Gait assistance: Mod assist;+2 safety/equipment;+2 physical  assistance Ambulation Distance (Feet): 10 Feet Assistive device: 2 person hand held assist Gait Pattern/deviations: Step-to pattern;Narrow base of support   Gait velocity interpretation: Below normal speed for age/gender General Gait Details: Pt with bil HHA with improved ability in standing and no scissoring today. Cues for posture, increased BOS and sequence. Pt walked 10' then 3' after seated rest. Too fatigued to ambulate further. chair to follow for safety   Stairs            Wheelchair Mobility    Modified Rankin (Stroke Patients Only)       Balance Overall balance assessment: Needs assistance   Sitting balance-Leahy Scale: Poor       Standing balance-Leahy Scale: Poor                      Cognition Arousal/Alertness: Awake/alert Behavior During Therapy: Flat affect Overall Cognitive Status: Impaired/Different from baseline Area of Impairment: Attention;Memory;Problem solving   Current Attention Level: Sustained Memory: Decreased recall of precautions;Decreased short-term memory Following Commands: Follows one step commands with increased time Safety/Judgement: Decreased awareness of safety   Problem Solving: Slow processing;Requires verbal cues;Difficulty sequencing General Comments: pt educated for sternal precautions as only able to state 2/5    Exercises      General Comments        Pertinent Vitals/Pain Pain Assessment: No/denies pain    Home Living                      Prior Function            PT Goals (current goals  can now be found in the care plan section) Acute Rehab PT Goals Time For Goal Achievement: 10/06/16 Potential to Achieve Goals: Fair Progress towards PT goals: Progressing toward goals (goals updated)    Frequency           PT Plan Current plan remains appropriate    Co-evaluation             End of Session Equipment Utilized During Treatment: Gait belt;Oxygen Activity Tolerance: Patient  tolerated treatment well Patient left: in bed;with call bell/phone within reach;with nursing/sitter in room     Time: 0805-0840 PT Time Calculation (min) (ACUTE ONLY): 35 min  Charges:  $Gait Training: 8-22 mins $Therapeutic Activity: 8-22 mins                    G Codes:      [name] 09-26-16, 8:49 AM Elwyn Reach, [place]

## 2016-09-22 NOTE — Progress Notes (Signed)
[facility] ROUNDING NOTE   Subjective:   Interval History:  No changes this morning awaiting SNF   Objective:  Vital signs in last 24 hours:  Temp:  [97.7 F (36.5 C)-98.3 F (36.8 C)] 97.7 F (36.5 C) (10/20 0828) Pulse Rate:  [75-96] 79 (10/20 1103) Resp:  [12-24] 18 (10/20 1103) BP: (86-150)/(53-86) 150/81 (10/20 1103) SpO2:  [94 %-100 %] 100 % (10/20 1103) FiO2 (%):  [28 %-40 %] 28 % (10/20 1103) Weight:  [64 kg (141 lb 1.5 oz)] 64 kg (141 lb 1.5 oz) (10/20 0500)  Weight change: 2.4 kg (5 lb 4.7 oz) Filed Weights   09/21/16 0705 09/21/16 1112 09/22/16 0500  Weight: 66.4 kg (146 lb 6.2 oz) 63.1 kg (139 lb 1.8 oz) 64 kg (141 lb 1.5 oz)    Intake/Output: I/O last 3 completed shifts: In: 9767 [NG/GT:1045] Out: 2750 [Other:2500; Stool:250]   Intake/Output this shift:  No intake/output data recorded.  General: alert Neuro: Normal strength HEENT: Trach site clean Cardiovascular: Regular, 2/6 murmur Lungs: No wheeze Abdomen: Soft, non tender Musculoskeletal: 1+ edema Skin: No rashes   Basic Metabolic Panel:  Recent Labs Lab 09/17/16 0350 09/18/16 0457 09/19/16 0345 09/20/16 0500 09/21/16 0733  NA 132* 132* 134* 131* 127*  K 3.8 4.0 3.2* 4.0 4.1  CL 95* 95* 98* 96* 91*  CO2 25 23 28 25 24   GLUCOSE 156* 138* 160* 104* 141*  BUN 67* 98* 44* 31* 71*  CREATININE 3.63* 4.39* 2.60* 1.94* 3.34*  CALCIUM 8.5* 9.0 8.0* 8.3* 8.3*  PHOS 4.7* 5.9* 3.0 1.8* 3.3    Liver Function Tests:  Recent Labs Lab 09/17/16 0350 09/18/16 0457 09/19/16 0345 09/20/16 0500 09/21/16 0733  ALBUMIN 2.3* 2.5* 2.2* 2.4* 2.3*   No results for input(s): LIPASE, AMYLASE in the last 168 hours. No results for input(s): AMMONIA in the last 168 hours.  CBC:  Recent Labs Lab 09/18/16 0457 09/19/16 0345 09/20/16 0500 09/21/16 0417 09/22/16 0505  WBC 17.7* 18.6* 22.0* 18.2* 16.1*  HGB 8.4* 8.1* 8.9* 8.9* 8.5*  HCT 27.3* 26.1* 28.3* 28.1* 27.7*  MCV  93.5 93.9 92.5 91.8 91.4  PLT 413* 380 400 426* 364    Cardiac Enzymes: No results for input(s): CKTOTAL, CKMB, CKMBINDEX, TROPONINI in the last 168 hours.  BNP: Invalid input(s): POCBNP  CBG:  Recent Labs Lab 09/22/16 0159 09/22/16 0505 09/22/16 0507 09/22/16 0605 09/22/16 0826  GLUCAP 218* 64* 70 35 112*    Microbiology: Results for orders placed or performed during the hospital encounter of 08/18/16  Surgical pcr screen     Status: None   Collection Time: 08/18/16  2:54 PM  Result Value Ref Range Status   MRSA, PCR NEGATIVE NEGATIVE Final   Staphylococcus aureus NEGATIVE NEGATIVE Final    Comment:        The Xpert SA Assay (FDA approved for NASAL specimens in patients over 7 years of age), is one component of a comprehensive surveillance program.  Test performance has been validated by [facility] for patients greater than or equal to 28 year old. It is not intended to diagnose infection nor to guide or monitor treatment.   Culture, respiratory (NON-Expectorated)     Status: None   Collection Time: 08/18/16  6:04 PM  Result Value Ref Range Status   Specimen Description TRACHEAL ASPIRATE  Final   Special Requests Normal  Final   Gram Stain   Final    MODERATE WBC PRESENT,BOTH PMN AND MONONUCLEAR ABUNDANT GRAM POSITIVE  COCCI IN PAIRS AND CHAINS RARE YEAST    Culture   Final    MODERATE STREPTOCOCCUS AGALACTIAE TESTING AGAINST S. AGALACTIAE NOT ROUTINELY PERFORMED DUE TO PREDICTABILITY OF AMP/PEN/VAN SUSCEPTIBILITY. FEW STAPHYLOCOCCUS AUREUS    Report Status 08/22/2016 FINAL  Final   Organism ID, Bacteria STAPHYLOCOCCUS AUREUS  Final      Susceptibility   Staphylococcus aureus - MIC*    CIPROFLOXACIN <=0.5 SENSITIVE Sensitive     ERYTHROMYCIN 0.5 SENSITIVE Sensitive     GENTAMICIN <=0.5 SENSITIVE Sensitive     OXACILLIN <=0.25 SENSITIVE Sensitive     TETRACYCLINE <=1 SENSITIVE Sensitive     VANCOMYCIN 1 SENSITIVE Sensitive     TRIMETH/SULFA <=10  SENSITIVE Sensitive     CLINDAMYCIN <=0.25 SENSITIVE Sensitive     RIFAMPIN <=0.5 SENSITIVE Sensitive     Inducible Clindamycin NEGATIVE Sensitive     * FEW STAPHYLOCOCCUS AUREUS  Culture, blood (Routine X 2) w Reflex to ID Panel     Status: None   Collection Time: 08/18/16  6:50 PM  Result Value Ref Range Status   Specimen Description BLOOD RIGHT ANTECUBITAL  Final   Special Requests BOTTLES DRAWN AEROBIC AND ANAEROBIC 5CC  Final   Culture NO GROWTH 5 DAYS  Final   Report Status 08/23/2016 FINAL  Final  Culture, blood (Routine X 2) w Reflex to ID Panel     Status: None   Collection Time: 08/18/16  7:00 PM  Result Value Ref Range Status   Specimen Description BLOOD RIGHT HAND  Final   Special Requests BOTTLES DRAWN AEROBIC AND ANAEROBIC 3CC  Final   Culture NO GROWTH 5 DAYS  Final   Report Status 08/23/2016 FINAL  Final  Surgical pcr screen     Status: Abnormal   Collection Time: 08/22/16  8:32 PM  Result Value Ref Range Status   MRSA, PCR NEGATIVE NEGATIVE Final   Staphylococcus aureus POSITIVE (A) NEGATIVE Final    Comment:        The Xpert SA Assay (FDA approved for NASAL specimens in patients over 67 years of age), is one component of a comprehensive surveillance program.  Test performance has been validated by [facility] for patients greater than or equal to 36 year old. It is not intended to diagnose infection nor to guide or monitor treatment.   Culture, blood (routine x 2)     Status: Abnormal   Collection Time: 08/27/16  9:00 AM  Result Value Ref Range Status   Specimen Description BLOOD LEFT HAND  Final   Special Requests IN PEDIATRIC BOTTLE 3CC  Final   Culture  Setup Time   Final    GRAM POSITIVE COCCI IN CLUSTERS IN PEDIATRIC BOTTLE Organism ID to follow CRITICAL RESULT CALLED TO, READ BACK BY AND VERIFIED WITH: A. Pablo Lawrence.D. 16:15 08/29/16 ([email or web address])    Culture (A)  Final    STAPHYLOCOCCUS SPECIES (COAGULASE NEGATIVE) THE SIGNIFICANCE OF ISOLATING  THIS ORGANISM FROM A SINGLE SET OF BLOOD CULTURES WHEN MULTIPLE SETS ARE DRAWN IS UNCERTAIN. PLEASE NOTIFY THE MICROBIOLOGY DEPARTMENT WITHIN ONE WEEK IF SPECIATION AND SENSITIVITIES ARE REQUIRED.    Report Status 08/30/2016 FINAL  Final  Blood Culture ID Panel (Reflexed)     Status: Abnormal   Collection Time: 08/27/16  9:00 AM  Result Value Ref Range Status   Enterococcus species NOT DETECTED NOT DETECTED Final   Listeria monocytogenes NOT DETECTED NOT DETECTED Final   Staphylococcus species DETECTED (A) NOT DETECTED Final    Comment: CRITICAL  RESULT CALLED TO, READ BACK BY AND VERIFIED WITH: A. Pablo Lawrence.D. 16:15 08/29/16 ([email or web address])    Staphylococcus aureus NOT DETECTED NOT DETECTED Final   Methicillin resistance DETECTED (A) NOT DETECTED Final    Comment: CRITICAL RESULT CALLED TO, READ BACK BY AND VERIFIED WITH: A. Pablo Lawrence.D. 16:15 08/29/16 ([email or web address])    Streptococcus species NOT DETECTED NOT DETECTED Final   Streptococcus agalactiae NOT DETECTED NOT DETECTED Final   Streptococcus pneumoniae NOT DETECTED NOT DETECTED Final   Streptococcus pyogenes NOT DETECTED NOT DETECTED Final   Acinetobacter baumannii NOT DETECTED NOT DETECTED Final   Enterobacteriaceae species NOT DETECTED NOT DETECTED Final   Enterobacter cloacae complex NOT DETECTED NOT DETECTED Final   Escherichia coli NOT DETECTED NOT DETECTED Final   Klebsiella oxytoca NOT DETECTED NOT DETECTED Final   Klebsiella pneumoniae NOT DETECTED NOT DETECTED Final   Proteus species NOT DETECTED NOT DETECTED Final   Serratia marcescens NOT DETECTED NOT DETECTED Final   Haemophilus influenzae NOT DETECTED NOT DETECTED Final   Neisseria meningitidis NOT DETECTED NOT DETECTED Final   Pseudomonas aeruginosa NOT DETECTED NOT DETECTED Final   Candida albicans NOT DETECTED NOT DETECTED Final   Candida glabrata NOT DETECTED NOT DETECTED Final   Candida krusei NOT DETECTED NOT DETECTED Final   Candida parapsilosis NOT DETECTED NOT  DETECTED Final   Candida tropicalis NOT DETECTED NOT DETECTED Final  Culture, blood (routine x 2)     Status: None   Collection Time: 08/27/16  9:19 AM  Result Value Ref Range Status   Specimen Description BLOOD RIGHT ASSIST CONTROL  Final   Special Requests BOTTLES DRAWN AEROBIC ONLY 4CC  Final   Culture NO GROWTH 5 DAYS  Final   Report Status 09/01/2016 FINAL  Final  C difficile quick scan w PCR reflex     Status: None   Collection Time: 09/01/16  8:20 AM  Result Value Ref Range Status   C Diff antigen NEGATIVE NEGATIVE Final   C Diff toxin NEGATIVE NEGATIVE Final   C Diff interpretation No C. difficile detected.  Final  Culture, blood (Routine X 2) w Reflex to ID Panel     Status: None   Collection Time: 09/02/16 10:20 AM  Result Value Ref Range Status   Specimen Description BLOOD RIGHT ANTECUBITAL  Final   Special Requests BOTTLES DRAWN AEROBIC ONLY 5CC  Final   Culture NO GROWTH 5 DAYS  Final   Report Status 09/07/2016 FINAL  Final  Culture, blood (Routine X 2) w Reflex to ID Panel     Status: None   Collection Time: 09/02/16 10:25 AM  Result Value Ref Range Status   Specimen Description BLOOD BLOOD RIGHT HAND  Final   Special Requests BOTTLES DRAWN AEROBIC ONLY 5CC  Final   Culture NO GROWTH 5 DAYS  Final   Report Status 09/07/2016 FINAL  Final  Culture, respiratory (NON-Expectorated)     Status: None   Collection Time: 09/03/16  7:48 AM  Result Value Ref Range Status   Specimen Description TRACHEAL ASPIRATE  Final   Special Requests NONE  Final   Gram Stain   Final    ABUNDANT WBC PRESENT,BOTH PMN AND MONONUCLEAR RARE SQUAMOUS EPITHELIAL CELLS PRESENT NO ORGANISMS SEEN    Culture FEW ENTEROBACTER SPECIES  Final   Report Status 09/06/2016 FINAL  Final   Organism ID, Bacteria ENTEROBACTER SPECIES  Final      Susceptibility   Enterobacter species - MIC*    CEFAZOLIN >=64 RESISTANT Resistant  CEFEPIME 2 RESISTANT Resistant     CEFTAZIDIME >=64 RESISTANT Resistant      CEFTRIAXONE >=64 RESISTANT Resistant     CIPROFLOXACIN <=0.25 SENSITIVE Sensitive     GENTAMICIN <=1 SENSITIVE Sensitive     IMIPENEM 1 SENSITIVE Sensitive     TRIMETH/SULFA <=20 SENSITIVE Sensitive     PIP/TAZO >=128 RESISTANT Resistant     * FEW ENTEROBACTER SPECIES  Culture, respiratory (NON-Expectorated)     Status: None   Collection Time: 09/14/16  8:37 AM  Result Value Ref Range Status   Specimen Description TRACHEAL ASPIRATE  Final   Special Requests NONE  Final   Gram Stain   Final    ABUNDANT WBC PRESENT,BOTH PMN AND MONONUCLEAR NO ORGANISMS SEEN    Culture FEW CANDIDA ALBICANS  Final   Report Status 09/17/2016 FINAL  Final  Culture, respiratory (NON-Expectorated)     Status: None   Collection Time: 09/19/16  1:00 PM  Result Value Ref Range Status   Specimen Description TRACHEAL ASPIRATE  Final   Special Requests NONE  Final   Gram Stain   Final    FEW WBC PRESENT, PREDOMINANTLY PMN FEW GRAM POSITIVE COCCI IN PAIRS AND CHAINS RARE GRAM POSITIVE COCCI IN CLUSTERS RARE YEAST    Culture Consistent with normal respiratory flora.  Final   Report Status 09/21/2016 FINAL  Final    Coagulation Studies:  Recent Labs  09/20/16 0519 09/21/16 0417 09/22/16 0505  LABPROT 22.1* 21.9* 31.7*  INR 1.90 1.88 2.99    Urinalysis: No results for input(s): COLORURINE, LABSPEC, PHURINE, GLUCOSEU, HGBUR, BILIRUBINUR, KETONESUR, PROTEINUR, UROBILINOGEN, NITRITE, LEUKOCYTESUR in the last 72 hours.  Invalid input(s): APPERANCEUR    Imaging: No results found.   Medications:   . sodium chloride     . amiodarone  200 mg Oral BID  . atorvastatin  20 mg Oral q1800  . chlorhexidine  15 mL Mouth Rinse BID  . clopidogrel  75 mg Oral Daily  . darbepoetin (ARANESP) injection - NON-DIALYSIS  100 mcg Subcutaneous Q Mon-1800  . famotidine  20 mg Oral Daily  . feeding supplement (NEPRO CARB STEADY)  1,000 mL Per Tube Q24H  . feeding supplement (PRO-STAT SUGAR FREE 64)  30 mL Per  Tube TID  . Gerhardt's butt cream   Topical BID  . glycopyrrolate  1 mg Per Tube BID  . guaiFENesin  30 mL Per Tube Q6H  . insulin aspart  0-24 Units Subcutaneous Q4H  . insulin detemir  10 Units Subcutaneous BID  . mouth rinse  15 mL Mouth Rinse q12n4p  . midodrine  10 mg Oral TID WC  . multivitamin  15 mL Per Tube Daily  . warfarin  1 mg Oral ONCE-1800  . Warfarin - Pharmacist Dosing Inpatient   Does not apply q1800   albuterol, HYDROcodone-acetaminophen, ondansetron (ZOFRAN) IV, sodium chloride flush  Assessment/ Plan:  60 yo female from [facility]/12 with STEMI s/p cath to [place].  Developed cardiogenic shock, CAP with Group B Strep, AKI, HCAP with Enterobacter.  Failed to wean and required trach.   1. Acute on chronic renal disease will continue with dialysis S/p cath/MVR CABG x 1 anuric and requiring dialysis  2. S/P STEMI 3. Diabetes reasonable 4.Anemia on aranesp  Hb 8.5                       5. Hypoxic respiratory Failure s/p trach  CAP with Group B strep >> completed Abx 9/23.                           HCAP with Enterobacter >> completed Abx 10/14.    LOS: 21 Richele Strand W @TODAY @12 :51 PM

## 2016-09-22 NOTE — Care Management Note (Addendum)
Case Management Note  Patient Details  Name: Deborah [name] MRN: [id number] Date of Birth: 07-Dec-1955   Subjective/Objective:    Copied from previous CM note from [place] 08/17/16            RNCM met with patient's son Deborah [name] [phone] and daughter Deborah [name] [phone] to discuss discharge planning. Patient is vented currently and son Deborah [name] is struggling with patient's current status. I and patient's RN spoke with him to try to comfort him- he appreciate it. Deborah [name] would like to be first line of contact for any medical needs. Patient is from home with Deborah [name]. She was independent with daily activity. Her PCP is Deborah [name]. Patient had been going to [facility] but per Deborah [name] "they were not meeting her mother's medical needs". Deborah [name] has been paying for physician visits for her mother. Patient is potentially going to need outpatient dialysis. She was not taking her medications per Deborah [name] due to cost- patient does not have health insurance. Deborah [name] wants her mother to have home health services- [name] nursing may be appropriate however it is too soon to make those arrangements. Both children are considering SNF if patient too weak to return home. They have met with financial advisors about Medicare and Medicaid per Deborah [name]/Deborah [name].   Action/Plan: Application to [facility] and Medication Management shared with Deborah [name]. RNCM to continue to follow.   Expected Discharge Date:                  Expected Discharge Plan:  [place]  In-House Referral:     Discharge planning Services     Post Acute Care Choice:    Choice offered to:     DME Arranged:    DME Agency:     HH Arranged:    [place] Agency:     Status of Service:  In process, will continue to follow  If discussed at Long Length of Stay Meetings, dates discussed:    Additional Comments: 09/22/2016  CM spoke with Dr Deborah [name] - pt will be perm HD - CIR informed.    Clipping process has not began.   Pt has been on 28% TC since 8am today.  CM spoke with daughter Deborah [name] post conversation between Deborah [name] and daughter.  Daughter informed CSW that pt does not want to discharge to SNF - pt only wants to go to CIR.  CM explained in detail that we are currently pursing CIR as first option however due to pts current status SNF is recommended as back up plan - the only other option is home.  Pt still does not have insurance on file - daughter verified that pt is self pay - CM explained that if pt was to discharge home any PT/OT would be out of pocket.  CM stressed safety concerns with pt discharging home if not accepted into CIR.  Daughter stated she understood and wants mom to be safe regardless and will speak with mom about safety concerns - daughter requested that CM contact CSW and request she continue with SNF process as back up plan. Daughter also requested CM contact CIR and inform that she is actively working on 24/7 care post discharge from [place] with her sister Deborah [name].    CM spoke with bedside nurse; pt was only suctioned once overnight and this am appears to be clearing secretions much better,nurse also stated that pt appears to be able to decrease to  28% TC - waiting on respiratory to assess pt.  CM will inform CIR/CSW of progress.  CM spoke directly with CIR admission coordinator;  Facility can accept pt on 40% TC however pt can not require frequent suctioning.  CM also spoke with CSW this am - actively following however pt will not be accepted into SNF until consistently at 28% TC.    09/21/16 Discussed in LOS 09/21/16:  Pt remains appropriate for continued stay Pt is now off all pressors, walked 10 feet yesterday however remains on 40% TC.  CIR actively following for possible disposition, CSW remains consulted for SNF placement as back up plan.  CM communicated with covering CSW regarding pt nearing discharge.  CM spoke with daughter and pt - and informed of discharge  plan including CIR with SNF as backup - daughter is agreeable to both  09/14/16 Plan:  Cont ATC 24/7. 28% FiO2 Trach collar 28% since 09/11/16, plan to change to cuffless trach 10/13 and consider downsizing.  SLP will eval for swallow and PMV once trach changed. Should be a candidate for decannulation in near future.  Both CIR and SNF following for discharge disposition   09/12/16 Pt recommended for CIR - CSW consulted placed as backup.  CM requested CIR consult via sticky notes.  09/04/16 Deborah Quinones, RN, BSN [phone] Pt is now extubated on [place], remains on CRRT, continuous Milrinone, Amiodarone, Precedex d/c today at extubation.   Per Development worker, community note ; medicaid application has been submitted  09/22/2016  CM spoke in depth with sister and brother in law - they feel that pt will beneift from post acute care facility due to lack of help in the home.  Per sister; pt stays with adult children but projected required help at home will not be sufficient at discharge.  CM discussed with family post acute discharge options including brief overview or LTACH and SNF in general (choice not given).  CM informed family that discharge determination will be followed closely and will be managed daily depending on progress.  Pt is now on vent, CRRT, pressors.  09/01/16 Deborah Quinones, RN, BSN [phone] Discussed in LOS 08/29/16:  Pt remains appropriate for continued stay.  Pt remains on CRRT and ventilator.  Per attending; If no significant improvement in 24-48hrs, anticipate pt will need a trache/PEG.  She still has volume issues and her decreased mental status make it difficult to wean/extubate safely.  08/24/16 Deborah Quinones, RN, BSN [phone] Pt now transferred to 2S s/p  MVR.  Postoperative management of balloon pump, vasopressors, and chest tube per CV S and cardiology. Patient remains critically ill on ventilator - may need CRRT.  CM will continue to follow for discharge  needs Deborah Labrador, RN 09/22/2016, 8:36 AM

## 2016-09-22 NOTE — Progress Notes (Signed)
ANTICOAGULATION CONSULT NOTE - Follow Up Consult  Pharmacy Consult for Coumadin Indication: Aflutter  No Known Allergies  Patient Measurements: Height: 5\' 1"  (154.9 cm) Weight: 141 lb 1.5 oz (64 kg) IBW/kg (Calculated) : 47.8  Vital Signs: Temp: 97.7 F (36.5 C) (10/20 0828) Temp Source: Oral (10/20 0828) BP: 127/79 (10/20 0900) Pulse Rate: 79 (10/20 0900)  Labs:  Recent Labs  09/20/16 0500 09/20/16 0519 09/21/16 0417 09/21/16 0733 09/22/16 0505  HGB 8.9*  --  8.9*  --  8.5*  HCT 28.3*  --  28.1*  --  27.7*  PLT 400  --  426*  --  364  LABPROT  --  22.1* 21.9*  --  31.7*  INR  --  1.90 1.88  --  2.99  CREATININE 1.94*  --   --  3.34*  --     Estimated Creatinine Clearance: 15.5 mL/min (by C-G formula based on SCr of 3.34 mg/dL (H)).   Assessment: 60yof continues on coumadin for aflutter.  Anticoag: s/p heparin preop for IABP, held post-op. IABP removed 9/23.  S/p hep bridge 10/13 INR 2.99  Nephro: Scr 3.34 now on iHD  Heme/Onc: H&h 8.5/27.7, Plt 364  Goal of Therapy:  INR 2-2.5 per Dr. Prescott Gum Monitor platelets by anticoagulation protocol: Yes   Plan:  -Coumadin 1mg  x 1 -Daily INR -Education once out of ICU  Levester Fresh, PharmD, BCPS, [facility] Clinical Pharmacist Pager [phone] 09/22/2016 10:31 AM

## 2016-09-22 NOTE — Progress Notes (Signed)
TCTS BRIEF SICU PROGRESS NOTE  22 Days Post-Op  S/P Procedure(s) (LRB): INSERTION OF DIALYSIS CATHETER LEFT INTERNAL JUGULAR VEIN & INSERTION OF TRIPLE LUMEN RIGHT INTERNAL JUGULAR VEIN (Bilateral)   Stable day  Plan: Continue current plan  Rexene Alberts, MD 09/22/2016 9:10 PM

## 2016-09-22 NOTE — Clinical Social Work Note (Signed)
Clinical Social Work Assessment  Patient Details  Name: Deborah [name] MRN: [id number] Date of Birth: 10/17/1956  Date of referral:  09/22/16               Reason for consult:  Facility Placement, Discharge Planning                Permission sought to share information with:  Facility Sport and exercise psychologist, Family Supports Permission granted to share information::  Yes, Verbal Permission Granted  Name::     Briget Shaheed  Agency::  SNF's  Relationship::  Daughter  Contact Information:  [phone]  Housing/Transportation Living arrangements for the past 2 months:  [place] of Information:  Patient, Medical Team Patient Interpreter Needed:  None Criminal Activity/Legal Involvement Pertinent to Current Situation/Hospitalization:  No - Comment as needed Significant Relationships:  Adult Children Lives with:  Self Do you feel safe going back to the place where you live?  Yes Need for family participation in patient care:  Yes (Comment)  Care giving concerns:  PT recommending CIR once medically stable for discharge. CSW working up for SNF backup.   Social Worker assessment / plan:  CSW met with patient. No supports at bedside. CSW introduced role and explained that PT recommendations would be discussed. Patient agreeable to SNF backup. She does not have insurance and has no funds to pay on her own. CSW spoke with Surveyor, quantity of social work who has approved an LOG if needed. No further concerns. CSW encouraged patient to contact CSW as needed. CSW will continue to follow patient for support and facilitate discharge to SNF, if needed, once medically stable.  Employment status:  Unemployed Forensic scientist:  Self Pay (Medicaid Pending) PT Recommendations:  Inpatient Rehab Consult Information / Referral to community resources:  [place]  Patient/Family's Response to care:  Patient agreeable to SNF backup. Patient's family supportive and involved  in patient's care. Patient appreciated social work intervention.  Patient/Family's Understanding of and Emotional Response to Diagnosis, Current Treatment, and Prognosis:  Patient understands the need for rehab at this time. Patient appears happy with hospital care.  Emotional Assessment Appearance:  Appears stated age Attitude/Demeanor/Rapport:  Lethargic Affect (typically observed):  Accepting, Appropriate, Calm Orientation:  Oriented to Self, Oriented to Place, Oriented to  Time, Oriented to Situation Alcohol / Substance use:  Never Used Psych involvement (Current and /or in the community):  No (Comment)  Discharge Needs  Concerns to be addressed:  Care Coordination Readmission within the last 30 days:  No Current discharge risk:  Dependent with Mobility, Lives alone, Inadequate Financial Supports, Other (Tracheostomy) Barriers to Discharge:  Inadequate or no insurance, Continued Medical Work up, Other   Candie Chroman, LCSW 09/22/2016, 11:56 AM

## 2016-09-22 NOTE — NC FL2 (Signed)
[place] LEVEL OF CARE SCREENING TOOL     IDENTIFICATION  Patient Name: Deborah [name] Birthdate: 1956-08-20 Sex: female Admission Date (Current Location): 08/18/2016  [facility] and [place] Number:  Engineering geologist and Address:  [facility], [place] [address] [place], [place] [address]      Provider Number: [id number]  Attending Physician Name and Address:  Ivin Poot, MD  Relative Name and Phone Number:  Natalee Tomkiewicz (Daughter) - [phone]    Current Level of Care: Hospital Recommended Level of Care: [place] Prior Approval Number:    Date Approved/Denied:   PASRR Number: [id number]  Discharge Plan: SNF    Current Diagnoses: Patient Active Problem List   Diagnosis Date Noted  . Diabetes mellitus type 2 in nonobese ([name])   . Stage 3 chronic kidney disease   . NSTEMI (non-ST elevated myocardial infarction) ([place])   . Tachypnea   . Hyponatremia   . Lymphocytosis   . Acute blood loss anemia   . Pressure injury of skin 09/18/2016  . Tracheostomy status ([place])   . Delirium   . Encounter for attention to tracheostomy ([place])   . History of MVR with cardiopulmonary bypass   . Systolic congestive heart failure ([place])   . HCAP (healthcare-associated pneumonia)   . PAF (paroxysmal atrial fibrillation) ([place])   . Acute systolic congestive heart failure ([place])   . AKI (acute kidney injury) ([place])   . Abnormal LFTs   . S/P MVR (mitral valve replacement)   . Mitral regurgitation 08/23/2016  . Acute on chronic diastolic heart failure ([place]) 08/18/2016  . Acute pulmonary edema ([name])   . Respiratory failure ([place]) 08/17/2016  . Cardiogenic shock ([place]) 08/17/2016  . Acute hypoxemic respiratory failure ([place])   . STEMI (ST elevation myocardial infarction) ([place]) 08/15/2016  . Noncompliance 01/25/2015  . Bereavement 07/09/2014  . Low back pain 02/17/2014  . Cervical cancer screening 11/20/2013  . Routine general medical  examination at a health care facility 10/14/2013  . Chronic kidney disease, stage 3 06/14/2012  . Hypertension 03/08/2012  . Diabetes mellitus type 2, controlled 03/08/2012  . Hyperlipidemia 03/08/2012    Orientation RESPIRATION BLADDER Height & Weight     Self, Time, Situation, Place  Tracheostomy (4 mm uncuffed. 28%) Continent Weight: 141 lb 1.5 oz (64 kg) Height:  5\' 1"  (154.9 cm)  BEHAVIORAL SYMPTOMS/MOOD NEUROLOGICAL BOWEL NUTRITION STATUS   (None)  (None)  (Rectal tube) Feeding tube ([place])  AMBULATORY STATUS COMMUNICATION OF NEEDS Skin   Limited Assist Verbally Bruising, Surgical wounds (Stage 2 pressure injuries: Left buttocks (dressing every 3 days), neck. MASD, excoriated, skin tear.)                       Personal Care Assistance Level of Assistance  Bathing, Feeding, Dressing Bathing Assistance: Maximum assistance Feeding assistance: Maximum assistance Dressing Assistance: Maximum assistance     Functional Limitations Info  Sight, Hearing, Speech Sight Info: Adequate Hearing Info: Adequate Speech Info: Adequate    SPECIAL CARE FACTORS FREQUENCY  PT (By licensed PT), OT (By licensed OT), Speech therapy, Blood pressure, Diabetic urine testing     PT Frequency: 5 x week OT Frequency: 5 x week     Speech Therapy Frequency: 5 x week      Contractures Contractures Info: Not present    Additional Factors Info  Code Status, Allergies Code Status Info: Full code Allergies Info: NKDA  Current Medications (09/22/2016):  This is the current hospital active medication list Current Facility-Administered Medications  Medication Dose Route Frequency Provider Last Rate Last Dose  . 0.9 %  sodium chloride infusion  250 mL Intravenous Continuous Tessa N Conte, PA-C      . albuterol (PROVENTIL) (2.5 MG/3ML) 0.083% nebulizer solution 2.5 mg  2.5 mg Nebulization Q2H PRN Chesley Mires, MD      . amiodarone (PACERONE) tablet 200 mg  200 mg Oral BID Amy D  Clegg, NP   200 mg at 09/22/16 1026  . atorvastatin (LIPITOR) tablet 20 mg  20 mg Oral q1800 Jolaine Artist, MD   20 mg at 09/21/16 1730  . chlorhexidine (PERIDEX) 0.12 % solution 15 mL  15 mL Mouth Rinse BID Ivin Poot, MD   15 mL at 09/22/16 1028  . clopidogrel (PLAVIX) tablet 75 mg  75 mg Oral Daily Ivin Poot, MD   75 mg at 09/22/16 1026  . Darbepoetin Alfa (ARANESP) injection 100 mcg  100 mcg Subcutaneous Q Mon-1800 Corliss Parish, MD   100 mcg at 09/11/16 1739  . famotidine (PEPCID) 40 MG/5ML suspension 20 mg  20 mg Oral Daily Melburn Popper, RPH   20 mg at 09/22/16 1026  . feeding supplement (NEPRO CARB STEADY) liquid 1,000 mL  1,000 mL Per Tube Q24H Ivin Poot, MD 35 mL/hr at 09/22/16 0600 1,000 mL at 09/22/16 0600  . feeding supplement (PRO-STAT SUGAR FREE 64) liquid 30 mL  30 mL Per Tube TID Ivin Poot, MD   30 mL at 09/22/16 1027  . Gerhardt's butt cream   Topical BID Ivin Poot, MD   1 application at 83/66/29 1028  . glycopyrrolate (ROBINUL) tablet 1 mg  1 mg Per Tube BID Chesley Mires, MD   1 mg at 09/22/16 1026  . guaiFENesin (ROBITUSSIN) 100 MG/5ML solution 600 mg  30 mL Per Tube Q6H Ivin Poot, MD   600 mg at 09/22/16 4765  . HYDROcodone-acetaminophen (HYCET) 7.5-325 mg/15 ml solution 15 mL  15 mL Oral Q4H PRN Ivin Poot, MD   15 mL at 09/21/16 2016  . insulin aspart (novoLOG) injection 0-24 Units  0-24 Units Subcutaneous Q4H Ivin Poot, MD   8 Units at 09/22/16 0205  . insulin detemir (LEVEMIR) injection 10 Units  10 Units Subcutaneous BID Ivin Poot, MD   10 Units at 09/22/16 1027  . MEDLINE mouth rinse  15 mL Mouth Rinse q12n4p Ivin Poot, MD   15 mL at 09/20/16 1603  . midodrine (PROAMATINE) tablet 10 mg  10 mg Oral TID WC Jolaine Artist, MD   10 mg at 09/22/16 0830  . multivitamin liquid 15 mL  15 mL Per Tube Daily Fleet Contras, MD   15 mL at 09/22/16 1027  . ondansetron (ZOFRAN) injection 4 mg  4 mg Intravenous  Q6H PRN Elgie Collard, PA-C   4 mg at 09/12/16 2253  . sodium chloride flush (NS) 0.9 % injection 3 mL  3 mL Intravenous PRN Elgie Collard, PA-C      . warfarin (COUMADIN) tablet 1 mg  1 mg Oral ONCE-1800 Wynell Balloon, RPH      . Warfarin - Pharmacist Dosing Inpatient   Does not apply [place], [place] at 09/15/16 1800     Discharge Medications: Please see discharge summary for a list of discharge medications.  Relevant Imaging Results:  Relevant Lab  Results:   Additional Information SS#: [id number]. Would need LOG.  Candie Chroman, LCSW

## 2016-09-22 NOTE — Progress Notes (Signed)
Speech Language Pathology Treatment: Dysphagia;Passy Muir Speaking valve  Patient Details Name: Deborah [name] MRN: [id number] DOB: [birth year] Today's Date: 09/22/2016 Time: 1000-1016 SLP Time Calculation (min) (ACUTE ONLY): 16 min  Assessment / Plan / Recommendation Clinical Impression  Pt wore PMV for over 15 minutes under direct supervision with no overt signs of intolerance, although her voice remains almost aphonic. Very low, hoarse phonation is achieved despite cueing for increased intensity. Various consistencies were provided for PO trials with coughing and multiple swallows elicited with all textures. Given her vocal quality, suspect she is having a difficult time achieving adequate glottal closure to protect airway. Recommend to continue use of PMV with full supervision, and allowing ice chips after oral care when PMV is in place.    HPI HPI: 60 yo female admitted to [facility] 08/15/16 with STEMI s/p cath to [place]. Developed cardiogenic shock, VDRF (ETT 9/14-9/29), AKI with metabolic acidosis and started on CRRT 08/17/16. 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 She also had fever and found to have Group B Strep in sputum culture.Mitral valve replacement 9/20. Swallow evaluation 9/30 with recs for NPO given poor mentation, severely impaired swallow function s/p prolonged intubation. Reintubated 9/30 secondary to septic shock; trach 10/5; new onset right hemiplegia and right gaze preference.  CT negative for acute changes; dx acute encephalopathy.       SLP Plan  Continue with current plan of care     Recommendations  Diet recommendations: NPO;Other(comment) (ice chips PRN after oral care) Medication Administration: Via alternative means      Patient may use Passy-Muir Speech Valve: Intermittently with supervision;During all therapies with supervision PMSV Supervision: Full         Oral Care Recommendations: Oral care prior to ice  chip/H20;Oral care QID Follow up Recommendations: Inpatient Rehab Plan: Continue with current plan of care       GO                Germain Osgood 09/22/2016, 10:57 AM  Germain Osgood, M.A. CCC-SLP [phone]

## 2016-09-22 NOTE — Progress Notes (Signed)
PULMONARY / CRITICAL CARE MEDICINE   Name: Deborah [name] MRN: [id number] DOB: [birth year]    ADMISSION DATE:  08/18/2016 CONSULTATION DATE:  08/18/2016  REFERRING MD: Bensimhon  CHIEF COMPLAINT: Chest pain  SUBJECTIVE:  Wants to eat.  VITAL SIGNS: BP 123/72   Pulse 80   Temp 97.7 F (36.5 C) (Oral)   Resp 16   Ht 5\' 1"  (1.549 m)   Wt 141 lb 1.5 oz (64 kg)   SpO2 100%   BMI 26.66 kg/m   INTAKE / OUTPUT: I/O last 3 completed shifts: In: 1829 [NG/GT:1045] Out: 2750 [Other:2500; Stool:250]  PHYSICAL EXAMINATION: General:  alert Neuro:  Normal strength HEENT:  Trach site clean Cardiovascular:  Regular, 2/6 murmur Lungs:  No wheeze Abdomen:  Soft, non tender Musculoskeletal:  1+ edema Skin:  No rashes  LABS: CMP Latest Ref Rng & Units 09/21/2016 09/20/2016 09/19/2016  Glucose 65 - 99 mg/dL 141(H) 104(H) 160(H)  BUN 6 - 20 mg/dL 71(H) 31(H) 44(H)  Creatinine 0.44 - 1.00 mg/dL 3.34(H) 1.94(H) 2.60(H)  Sodium 135 - 145 mmol/L 127(L) 131(L) 134(L)  Potassium 3.5 - 5.1 mmol/L 4.1 4.0 3.2(L)  Chloride 101 - 111 mmol/L 91(L) 96(L) 98(L)  CO2 22 - 32 mmol/L 24 25 28   Calcium 8.9 - 10.3 mg/dL 8.3(L) 8.3(L) 8.0(L)  Total Protein 6.5 - 8.1 g/dL - - -  Total Bilirubin 0.3 - 1.2 mg/dL - - -  Alkaline Phos 38 - 126 U/L - - -  AST 15 - 41 U/L - - -  ALT 14 - 54 U/L - - -    CBC Latest Ref Rng & Units 09/22/2016 09/21/2016 09/20/2016  WBC 4.0 - 10.5 K/uL 16.1(H) 18.2(H) 22.0(H)  Hemoglobin 12.0 - 15.0 g/dL 8.5(L) 8.9(L) 8.9(L)  Hematocrit 36.0 - 46.0 % 27.7(L) 28.1(L) 28.3(L)  Platelets 150 - 400 K/uL 364 426(H) 400    Glucose  Recent Labs Lab 09/21/16 1953 09/22/16 0159 09/22/16 0505 09/22/16 0507 09/22/16 0605 09/22/16 0826  GLUCAP 133* 218* 64* 70 92 112*    Imaging No results found.  STUDIES:  LHC 9/12 >> PCI to OM1, severe MR, EF 55 to 60% RHC 9/15 >> RA 5, RV 37/3/6, PA 38/18/26, PCWP 15, CI 1.9, PVR 3.3 WU TEE 9/15 >> EF 60 to 65%, flail motion MV  with severe MR TTE 10/15 >> EF 60 to 65%, mild LVH  CULTURES: Sputum 9/15 >> Group B Strep, Staph aureus Blood 9/24 >> coag negative Staph Sputum 10/01 >> Enterobacter Sputum 10/12 >> Few Candida albicans  SIGNIFICANT EVENTS: 09/12 Admit to [facility] 09/15 To [facility] 09/20 MVR with bioprosthetic valve 09/28 Permcath for HD 09/29 Extubated 09/30 Reintubated 10/05 Trach by Hyman Bible 10/09 Off vent 10/13 Changed to #4 cuffless trach  DISCUSSION: 60 yo female from [facility] 9/12 with STEMI s/p cath to [place].  Developed cardiogenic shock, CAP with Group B Strep, AKI, HCAP with Enterobacter.  Failed to wean and required trach.  ASSESSMENT / PLAN:  Acute hypoxic respiratory failure 2nd to CAP, STEMI. Failure to wean s/p tracheostomy. CAP with Group B strep >> completed Abx 9/23. HCAP with Enterobacter >> completed Abx 10/14. 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 A fib. - per cardiology and TCTS  AKI now with stage 5 CKD. - HD per nephrology  Agitated delirium >> improved. -  d/c seroquel 10/20  Dysphagia. - f/u with speech therapy  Deconditioning. - PT recommending CIR  PCCM will f/u intermittently for trach management >> call if help needed in between.  Chesley Mires, MD [facility] Pulmonary/Critical Care 09/22/2016, 8:52 AM Pager:  [phone] After 3pm call: [phone]

## 2016-09-23 LAB — GLUCOSE, CAPILLARY
GLUCOSE-CAPILLARY: 130 mg/dL — AB (ref 65–99)
GLUCOSE-CAPILLARY: 190 mg/dL — AB (ref 65–99)
GLUCOSE-CAPILLARY: 138 mg/dL — AB (ref 65–99)
Glucose-Capillary: 178 mg/dL — ABNORMAL HIGH (ref 65–99)
Glucose-Capillary: 133 mg/dL — ABNORMAL HIGH (ref 65–99)

## 2016-09-23 LAB — PROTIME-INR
INR: 3.9
PROTHROMBIN TIME: 39.2 s — AB (ref 11.4–15.2)

## 2016-09-23 NOTE — Progress Notes (Signed)
ANTICOAGULATION CONSULT NOTE - Follow Up Consult  Pharmacy Consult for Coumadin Indication: Aflutter  No Known Allergies  Patient Measurements: Height: 5\' 1"  (154.9 cm) Weight: 138 lb 0.1 oz (62.6 kg) IBW/kg (Calculated) : 47.8  Vital Signs: Temp: 98.2 F (36.8 C) (10/21 1137) Temp Source: Oral (10/21 1137) BP: 155/85 (10/21 1230) Pulse Rate: 84 (10/21 1245)  Labs:  Recent Labs  09/21/16 0417 09/21/16 0733 09/22/16 0505 09/23/16 0355  HGB 8.9*  --  8.5*  --   HCT 28.1*  --  27.7*  --   PLT 426*  --  364  --   LABPROT 21.9*  --  31.7* 39.2*  INR 1.88  --  2.99 3.90  CREATININE  --  3.34*  --   --     Estimated Creatinine Clearance: 15.4 mL/min (by C-G formula based on SCr of 3.34 mg/dL (H)).   Assessment: 60yof continues on coumadin for aflutter> in SR after TEE/DCCV post op.  Anticoag: s/p heparin preop for IABP, held post-op. IABP removed 9/23.  S/p hep bridge 10/13 INR 2.99>3.99 - will hold  Nephro: Scr 3.34 now on iHD  Heme/Onc: H&h 8.5/27.7, Plt 364  Goal of Therapy:  INR 2-2.5 per Dr. Prescott Gum Monitor platelets by anticoagulation protocol: Yes   Plan:  No warfarin tonight -Daily INR -Education once out of ICU  Joyce.D. CPP, BCPS Clinical Pharmacist [phone] 09/23/2016 3:25 PM

## 2016-09-23 NOTE — Progress Notes (Addendum)
      [address]       [place] [address]             [phone]        CARDIOTHORACIC SURGERY PROGRESS NOTE   R23 Days Post-Op Procedure(s) (LRB): INSERTION OF DIALYSIS CATHETER LEFT INTERNAL JUGULAR VEIN & INSERTION OF TRIPLE LUMEN RIGHT INTERNAL JUGULAR VEIN (Bilateral)  Subjective: Remains quite stable.  Walks some w/ assistance.  Reportedly spontaneously voided some urine last night.  Objective: Vital signs: BP Readings from Last 1 Encounters:  09/23/16 122/71   Pulse Readings from Last 1 Encounters:  09/23/16 70   Resp Readings from Last 1 Encounters:  09/23/16 14   Temp Readings from Last 1 Encounters:  09/23/16 98.2 F (36.8 C) (Oral)    Hemodynamics:    Physical Exam:  Rhythm:   NSR  Breath sounds: Scattered rhonchi  Heart sounds:  RRR  Incisions:  Clean and dry  Abdomen:  Soft, non-distended, non-tender  Extremities:  Warm, well-perfused   Intake/Output from previous day: 10/20 0701 - 10/21 0700 In: 1355 [NG/GT:1355] Out: 100 [Stool:100] Intake/Output this shift: No intake/output data recorded.  Lab Results:  CBC: Recent Labs  09/21/16 0417 09/22/16 0505  WBC 18.2* 16.1*  HGB 8.9* 8.5*  HCT 28.1* 27.7*  PLT 426* 364    BMET:  Recent Labs  09/21/16 0733  NA 127*  K 4.1  CL 91*  CO2 24  GLUCOSE 141*  BUN 71*  CREATININE 3.34*  CALCIUM 8.3*     PT/INR:   Recent Labs  09/23/16 0355  LABPROT 39.2*  INR 3.90    CBG (last 3)   Recent Labs  09/23/16 0334 09/23/16 0828 09/23/16 1135  GLUCAP 178* 130* 190*    ABG    Component Value Date/Time   PHART 7.541 (H) 09/08/2016 0412   PCO2ART 29.4 (L) 09/08/2016 0412   PO2ART 120.0 (H) 09/08/2016 0412   HCO3 25.2 09/08/2016 0412   TCO2 26 09/08/2016 0412   ACIDBASEDEF 4.1 (H) 09/02/2016 1850   O2SAT 85.8 09/22/2016 0503    CXR: n/a  Assessment/Plan: S/P Procedure(s) (LRB): INSERTION OF DIALYSIS CATHETER LEFT INTERNAL JUGULAR VEIN & INSERTION OF  TRIPLE LUMEN RIGHT INTERNAL JUGULAR VEIN (Bilateral)  Remains stable NSR w/ stable BP Breathing comfortably Strength and mobility slowly improving Tolerating tube feeds Reportedly voided small amount of urine but BUN/Cr remain elevated WBC trending down INR up further   Continue current plan  Hold coumadin today  Rexene Alberts, MD 09/23/2016 11:56 AM

## 2016-09-23 NOTE — Progress Notes (Signed)
[facility] ROUNDING NOTE   Subjective:   Interval History:  Sitting in chair no issues  Objective:  Vital signs in last 24 hours:  Temp:  [97.9 F (36.6 C)-98.8 F (37.1 C)] 98.1 F (36.7 C) (10/21 0830) Pulse Rate:  [64-83] 70 (10/21 0500) Resp:  [11-23] 14 (10/21 0500) BP: (111-157)/(69-97) 122/71 (10/21 0500) SpO2:  [97 %-100 %] 100 % (10/21 0500) FiO2 (%):  [28 %] 28 % (10/21 0400) Weight:  [62.6 kg (138 lb 0.1 oz)] 62.6 kg (138 lb 0.1 oz) (10/21 0341)  Weight change: -3.8 kg (-8 lb 6 oz) Filed Weights   09/21/16 1112 09/22/16 0500 09/23/16 0341  Weight: 63.1 kg (139 lb 1.8 oz) 64 kg (141 lb 1.5 oz) 62.6 kg (138 lb 0.1 oz)    Intake/Output: I/O last 3 completed shifts: In: 1775 [NG/GT:1775] Out: 150 [Stool:150]   Intake/Output this shift:  No intake/output data recorded.  General: alert Neuro: Normal strength HEENT: Trach site clean Cardiovascular: Regular, 2/6 murmur Lungs: No wheeze Abdomen: Soft, non tender Musculoskeletal: 1+ edema Skin: No rashes   Basic Metabolic Panel:  Recent Labs Lab 09/17/16 0350 09/18/16 0457 09/19/16 0345 09/20/16 0500 09/21/16 0733  NA 132* 132* 134* 131* 127*  K 3.8 4.0 3.2* 4.0 4.1  CL 95* 95* 98* 96* 91*  CO2 25 23 28 25 24   GLUCOSE 156* 138* 160* 104* 141*  BUN 67* 98* 44* 31* 71*  CREATININE 3.63* 4.39* 2.60* 1.94* 3.34*  CALCIUM 8.5* 9.0 8.0* 8.3* 8.3*  PHOS 4.7* 5.9* 3.0 1.8* 3.3    Liver Function Tests:  Recent Labs Lab 09/17/16 0350 09/18/16 0457 09/19/16 0345 09/20/16 0500 09/21/16 0733  ALBUMIN 2.3* 2.5* 2.2* 2.4* 2.3*   No results for input(s): LIPASE, AMYLASE in the last 168 hours. No results for input(s): AMMONIA in the last 168 hours.  CBC:  Recent Labs Lab 09/18/16 0457 09/19/16 0345 09/20/16 0500 09/21/16 0417 09/22/16 0505  WBC 17.7* 18.6* 22.0* 18.2* 16.1*  HGB 8.4* 8.1* 8.9* 8.9* 8.5*  HCT 27.3* 26.1* 28.3* 28.1* 27.7*  MCV 93.5 93.9 92.5 91.8 91.4   PLT 413* 380 400 426* 364    Cardiac Enzymes: No results for input(s): CKTOTAL, CKMB, CKMBINDEX, TROPONINI in the last 168 hours.  BNP: Invalid input(s): POCBNP  CBG:  Recent Labs Lab 09/22/16 1616 09/22/16 2005 09/23/16 0000 09/23/16 0334 09/23/16 0828  GLUCAP 115* 198* 185* 178* 130*    Microbiology: Results for orders placed or performed during the hospital encounter of 08/18/16  Surgical pcr screen     Status: None   Collection Time: 08/18/16  2:54 PM  Result Value Ref Range Status   MRSA, PCR NEGATIVE NEGATIVE Final   Staphylococcus aureus NEGATIVE NEGATIVE Final    Comment:        The Xpert SA Assay (FDA approved for NASAL specimens in patients over 37 years of age), is one component of a comprehensive surveillance program.  Test performance has been validated by [facility] for patients greater than or equal to 53 year old. It is not intended to diagnose infection nor to guide or monitor treatment.   Culture, respiratory (NON-Expectorated)     Status: None   Collection Time: 08/18/16  6:04 PM  Result Value Ref Range Status   Specimen Description TRACHEAL ASPIRATE  Final   Special Requests Normal  Final   Gram Stain   Final    MODERATE WBC PRESENT,BOTH PMN AND MONONUCLEAR ABUNDANT GRAM POSITIVE COCCI IN PAIRS AND  CHAINS RARE YEAST    Culture   Final    MODERATE STREPTOCOCCUS AGALACTIAE TESTING AGAINST S. AGALACTIAE NOT ROUTINELY PERFORMED DUE TO PREDICTABILITY OF AMP/PEN/VAN SUSCEPTIBILITY. FEW STAPHYLOCOCCUS AUREUS    Report Status 08/22/2016 FINAL  Final   Organism ID, Bacteria STAPHYLOCOCCUS AUREUS  Final      Susceptibility   Staphylococcus aureus - MIC*    CIPROFLOXACIN <=0.5 SENSITIVE Sensitive     ERYTHROMYCIN 0.5 SENSITIVE Sensitive     GENTAMICIN <=0.5 SENSITIVE Sensitive     OXACILLIN <=0.25 SENSITIVE Sensitive     TETRACYCLINE <=1 SENSITIVE Sensitive     VANCOMYCIN 1 SENSITIVE Sensitive     TRIMETH/SULFA <=10 SENSITIVE Sensitive      CLINDAMYCIN <=0.25 SENSITIVE Sensitive     RIFAMPIN <=0.5 SENSITIVE Sensitive     Inducible Clindamycin NEGATIVE Sensitive     * FEW STAPHYLOCOCCUS AUREUS  Culture, blood (Routine X 2) w Reflex to ID Panel     Status: None   Collection Time: 08/18/16  6:50 PM  Result Value Ref Range Status   Specimen Description BLOOD RIGHT ANTECUBITAL  Final   Special Requests BOTTLES DRAWN AEROBIC AND ANAEROBIC 5CC  Final   Culture NO GROWTH 5 DAYS  Final   Report Status 08/23/2016 FINAL  Final  Culture, blood (Routine X 2) w Reflex to ID Panel     Status: None   Collection Time: 08/18/16  7:00 PM  Result Value Ref Range Status   Specimen Description BLOOD RIGHT HAND  Final   Special Requests BOTTLES DRAWN AEROBIC AND ANAEROBIC 3CC  Final   Culture NO GROWTH 5 DAYS  Final   Report Status 08/23/2016 FINAL  Final  Surgical pcr screen     Status: Abnormal   Collection Time: 08/22/16  8:32 PM  Result Value Ref Range Status   MRSA, PCR NEGATIVE NEGATIVE Final   Staphylococcus aureus POSITIVE (A) NEGATIVE Final    Comment:        The Xpert SA Assay (FDA approved for NASAL specimens in patients over 83 years of age), is one component of a comprehensive surveillance program.  Test performance has been validated by [facility] for patients greater than or equal to 8 year old. It is not intended to diagnose infection nor to guide or monitor treatment.   Culture, blood (routine x 2)     Status: Abnormal   Collection Time: 08/27/16  9:00 AM  Result Value Ref Range Status   Specimen Description BLOOD LEFT HAND  Final   Special Requests IN PEDIATRIC BOTTLE 3CC  Final   Culture  Setup Time   Final    GRAM POSITIVE COCCI IN CLUSTERS IN PEDIATRIC BOTTLE Organism ID to follow CRITICAL RESULT CALLED TO, READ BACK BY AND VERIFIED WITH: A. Pablo Lawrence.D. 16:15 08/29/16 ([email or web address])    Culture (A)  Final    STAPHYLOCOCCUS SPECIES (COAGULASE NEGATIVE) THE SIGNIFICANCE OF ISOLATING THIS ORGANISM FROM A  SINGLE SET OF BLOOD CULTURES WHEN MULTIPLE SETS ARE DRAWN IS UNCERTAIN. PLEASE NOTIFY THE MICROBIOLOGY DEPARTMENT WITHIN ONE WEEK IF SPECIATION AND SENSITIVITIES ARE REQUIRED.    Report Status 08/30/2016 FINAL  Final  Blood Culture ID Panel (Reflexed)     Status: Abnormal   Collection Time: 08/27/16  9:00 AM  Result Value Ref Range Status   Enterococcus species NOT DETECTED NOT DETECTED Final   Listeria monocytogenes NOT DETECTED NOT DETECTED Final   Staphylococcus species DETECTED (A) NOT DETECTED Final    Comment: CRITICAL RESULT CALLED TO, READ  BACK BY AND VERIFIED WITH: A. Pablo Lawrence.D. 16:15 08/29/16 ([email or web address])    Staphylococcus aureus NOT DETECTED NOT DETECTED Final   Methicillin resistance DETECTED (A) NOT DETECTED Final    Comment: CRITICAL RESULT CALLED TO, READ BACK BY AND VERIFIED WITH: A. Pablo Lawrence.D. 16:15 08/29/16 ([email or web address])    Streptococcus species NOT DETECTED NOT DETECTED Final   Streptococcus agalactiae NOT DETECTED NOT DETECTED Final   Streptococcus pneumoniae NOT DETECTED NOT DETECTED Final   Streptococcus pyogenes NOT DETECTED NOT DETECTED Final   Acinetobacter baumannii NOT DETECTED NOT DETECTED Final   Enterobacteriaceae species NOT DETECTED NOT DETECTED Final   Enterobacter cloacae complex NOT DETECTED NOT DETECTED Final   Escherichia coli NOT DETECTED NOT DETECTED Final   Klebsiella oxytoca NOT DETECTED NOT DETECTED Final   Klebsiella pneumoniae NOT DETECTED NOT DETECTED Final   Proteus species NOT DETECTED NOT DETECTED Final   Serratia marcescens NOT DETECTED NOT DETECTED Final   Haemophilus influenzae NOT DETECTED NOT DETECTED Final   Neisseria meningitidis NOT DETECTED NOT DETECTED Final   Pseudomonas aeruginosa NOT DETECTED NOT DETECTED Final   Candida albicans NOT DETECTED NOT DETECTED Final   Candida glabrata NOT DETECTED NOT DETECTED Final   Candida krusei NOT DETECTED NOT DETECTED Final   Candida parapsilosis NOT DETECTED NOT DETECTED Final    Candida tropicalis NOT DETECTED NOT DETECTED Final  Culture, blood (routine x 2)     Status: None   Collection Time: 08/27/16  9:19 AM  Result Value Ref Range Status   Specimen Description BLOOD RIGHT ASSIST CONTROL  Final   Special Requests BOTTLES DRAWN AEROBIC ONLY 4CC  Final   Culture NO GROWTH 5 DAYS  Final   Report Status 09/01/2016 FINAL  Final  C difficile quick scan w PCR reflex     Status: None   Collection Time: 09/01/16  8:20 AM  Result Value Ref Range Status   C Diff antigen NEGATIVE NEGATIVE Final   C Diff toxin NEGATIVE NEGATIVE Final   C Diff interpretation No C. difficile detected.  Final  Culture, blood (Routine X 2) w Reflex to ID Panel     Status: None   Collection Time: 09/02/16 10:20 AM  Result Value Ref Range Status   Specimen Description BLOOD RIGHT ANTECUBITAL  Final   Special Requests BOTTLES DRAWN AEROBIC ONLY 5CC  Final   Culture NO GROWTH 5 DAYS  Final   Report Status 09/07/2016 FINAL  Final  Culture, blood (Routine X 2) w Reflex to ID Panel     Status: None   Collection Time: 09/02/16 10:25 AM  Result Value Ref Range Status   Specimen Description BLOOD BLOOD RIGHT HAND  Final   Special Requests BOTTLES DRAWN AEROBIC ONLY 5CC  Final   Culture NO GROWTH 5 DAYS  Final   Report Status 09/07/2016 FINAL  Final  Culture, respiratory (NON-Expectorated)     Status: None   Collection Time: 09/03/16  7:48 AM  Result Value Ref Range Status   Specimen Description TRACHEAL ASPIRATE  Final   Special Requests NONE  Final   Gram Stain   Final    ABUNDANT WBC PRESENT,BOTH PMN AND MONONUCLEAR RARE SQUAMOUS EPITHELIAL CELLS PRESENT NO ORGANISMS SEEN    Culture FEW ENTEROBACTER SPECIES  Final   Report Status 09/06/2016 FINAL  Final   Organism ID, Bacteria ENTEROBACTER SPECIES  Final      Susceptibility   Enterobacter species - MIC*    CEFAZOLIN >=64 RESISTANT Resistant     CEFEPIME  2 RESISTANT Resistant     CEFTAZIDIME >=64 RESISTANT Resistant     CEFTRIAXONE  >=64 RESISTANT Resistant     CIPROFLOXACIN <=0.25 SENSITIVE Sensitive     GENTAMICIN <=1 SENSITIVE Sensitive     IMIPENEM 1 SENSITIVE Sensitive     TRIMETH/SULFA <=20 SENSITIVE Sensitive     PIP/TAZO >=128 RESISTANT Resistant     * FEW ENTEROBACTER SPECIES  Culture, respiratory (NON-Expectorated)     Status: None   Collection Time: 09/14/16  8:37 AM  Result Value Ref Range Status   Specimen Description TRACHEAL ASPIRATE  Final   Special Requests NONE  Final   Gram Stain   Final    ABUNDANT WBC PRESENT,BOTH PMN AND MONONUCLEAR NO ORGANISMS SEEN    Culture FEW CANDIDA ALBICANS  Final   Report Status 09/17/2016 FINAL  Final  Culture, respiratory (NON-Expectorated)     Status: None   Collection Time: 09/19/16  1:00 PM  Result Value Ref Range Status   Specimen Description TRACHEAL ASPIRATE  Final   Special Requests NONE  Final   Gram Stain   Final    FEW WBC PRESENT, PREDOMINANTLY PMN FEW GRAM POSITIVE COCCI IN PAIRS AND CHAINS RARE GRAM POSITIVE COCCI IN CLUSTERS RARE YEAST    Culture Consistent with normal respiratory flora.  Final   Report Status 09/21/2016 FINAL  Final    Coagulation Studies:  Recent Labs  09/21/16 0417 09/22/16 0505 09/23/16 0355  LABPROT 21.9* 31.7* 39.2*  INR 1.88 2.99 3.90    Urinalysis: No results for input(s): COLORURINE, LABSPEC, PHURINE, GLUCOSEU, HGBUR, BILIRUBINUR, KETONESUR, PROTEINUR, UROBILINOGEN, NITRITE, LEUKOCYTESUR in the last 72 hours.  Invalid input(s): APPERANCEUR    Imaging: No results found.   Medications:   . sodium chloride     . amiodarone  200 mg Oral BID  . atorvastatin  20 mg Oral q1800  . chlorhexidine  15 mL Mouth Rinse BID  . clopidogrel  75 mg Oral Daily  . darbepoetin (ARANESP) injection - NON-DIALYSIS  100 mcg Subcutaneous Q Mon-1800  . famotidine  20 mg Oral Daily  . feeding supplement (NEPRO CARB STEADY)  1,000 mL Per Tube Q24H  . feeding supplement (PRO-STAT SUGAR FREE 64)  30 mL Per Tube TID  .  Gerhardt's butt cream   Topical BID  . glycopyrrolate  1 mg Per Tube BID  . guaiFENesin  30 mL Per Tube Q6H  . insulin aspart  0-24 Units Subcutaneous Q4H  . insulin detemir  10 Units Subcutaneous BID  . mouth rinse  15 mL Mouth Rinse q12n4p  . midodrine  10 mg Oral TID WC  . multivitamin  15 mL Per Tube Daily  . Warfarin - Pharmacist Dosing Inpatient   Does not apply q1800   albuterol, HYDROcodone-acetaminophen, ondansetron (ZOFRAN) IV, sodium chloride flush  Assessment/ Plan:  60 yo female from [facility] 9/12 with STEMI s/p cath to [place]. Developed cardiogenic shock, CAP with Group B Strep, AKI, HCAP with Enterobacter. Failed to wean and required trach.   1. Acute on chronic renal disease will continue with dialysis S/p cath/MVR CABG x 1 anuric and requiring dialysis schedule TTS   Patient is deemed end stage renal disease but will watch for recovery since 9/15 ( started on CRRT at [facility]) 2. S/P STEMI 3. Diabetes reasonable 4.Anemia on aranesp  Hb 8.5                       5. Hypoxic respiratory Failure s/p trach  CAP with Group B strep >> completed Abx 9/23.                           HCAP with Enterobacter >> completed Abx 10/14.    LOS: 15 Jemma Rasp W @TODAY @9 :56 AM

## 2016-09-24 LAB — GLUCOSE, CAPILLARY
GLUCOSE-CAPILLARY: 114 mg/dL — AB (ref 65–99)
GLUCOSE-CAPILLARY: 183 mg/dL — AB (ref 65–99)
GLUCOSE-CAPILLARY: 158 mg/dL — AB (ref 65–99)
Glucose-Capillary: 152 mg/dL — ABNORMAL HIGH (ref 65–99)
Glucose-Capillary: 177 mg/dL — ABNORMAL HIGH (ref 65–99)
Glucose-Capillary: 201 mg/dL — ABNORMAL HIGH (ref 65–99)
Glucose-Capillary: 218 mg/dL — ABNORMAL HIGH (ref 65–99)

## 2016-09-24 LAB — RENAL FUNCTION PANEL
Albumin: 2.5 g/dL — ABNORMAL LOW (ref 3.5–5.0)
Anion gap: 14 (ref 5–15)
BUN: 109 mg/dL — AB (ref 6–20)
CHLORIDE: 93 mmol/L — AB (ref 101–111)
CO2: 25 mmol/L (ref 22–32)
CREATININE: 4.7 mg/dL — AB (ref 0.44–1.00)
Calcium: 8.7 mg/dL — ABNORMAL LOW (ref 8.9–10.3)
GFR calc non Af Amer: 9 mL/min — ABNORMAL LOW (ref >60)
GFR, EST AFRICAN AMERICAN: 11 mL/min — AB (ref >60)
Glucose, Bld: 234 mg/dL — ABNORMAL HIGH (ref 65–99)
POTASSIUM: 3.3 mmol/L — AB (ref 3.5–5.1)
Phosphorus: 5.2 mg/dL — ABNORMAL HIGH (ref 2.5–4.6)
Sodium: 132 mmol/L — ABNORMAL LOW (ref 135–145)

## 2016-09-24 LAB — CBC
HEMATOCRIT: 29.6 % — AB (ref 36.0–46.0)
Hemoglobin: 9.4 g/dL — ABNORMAL LOW (ref 12.0–15.0)
MCH: 28.1 pg (ref 26.0–34.0)
MCHC: 31.8 g/dL (ref 30.0–36.0)
MCV: 88.6 fL (ref 78.0–100.0)
PLATELETS: 433 10*3/uL — AB (ref 150–400)
RBC: 3.34 MIL/uL — AB (ref 3.87–5.11)
RDW: 15.6 % — ABNORMAL HIGH (ref 11.5–15.5)
WBC: 16.5 10*3/uL — ABNORMAL HIGH (ref 4.0–10.5)

## 2016-09-24 LAB — PROTIME-INR
INR: 4.62
Prothrombin Time: 44.3 seconds — ABNORMAL HIGH (ref 11.4–15.2)

## 2016-09-24 MED ORDER — LIDOCAINE HCL (PF) 1 % IJ SOLN: 5.0000 mL | INTRAMUSCULAR | Status: DC | PRN

## 2016-09-24 MED ORDER — SODIUM CHLORIDE 0.9 % IV SOLN: 100.0000 mL | INTRAVENOUS | Status: DC | PRN

## 2016-09-24 MED ORDER — PENTAFLUOROPROP-TETRAFLUOROETH EX AERO: 1.0000 "application " | INHALATION_SPRAY | CUTANEOUS | Status: DC | PRN

## 2016-09-24 MED ORDER — LIDOCAINE-PRILOCAINE 2.5-2.5 % EX CREA
1.0000 "application " | TOPICAL_CREAM | CUTANEOUS | Status: DC | PRN
  Filled 2016-09-24: qty 5

## 2016-09-24 MED ORDER — HEPARIN SODIUM (PORCINE) 1000 UNIT/ML DIALYSIS: 1000.0000 [IU] | INTRAMUSCULAR | Status: DC | PRN

## 2016-09-24 MED ORDER — ALTEPLASE 2 MG IJ SOLR: 2.0000 mg | Freq: Once | INTRAMUSCULAR | Status: DC | PRN

## 2016-09-24 NOTE — Progress Notes (Signed)
Dialysis treatment completed.  3500 mL ultrafiltrated.  3000 mL net fluid removal.  Patient status unchanged. Lung sounds diminished to ausculation in all fields. Generalized edema. Cardiac: NSR.  Cleansed LIJ catheter with chlorhexidine.  Disconnected lines and flushed ports with saline per protocol.  Ports locked with heparin and capped per protocol.    Report given to bedside, RN Crystal.

## 2016-09-24 NOTE — Progress Notes (Signed)
ANTICOAGULATION CONSULT NOTE - Follow Up Consult  Pharmacy Consult for Coumadin Indication: Aflutter  No Known Allergies  Patient Measurements: Height: 5\' 1"  (154.9 cm) Weight: 139 lb 8.8 oz (63.3 kg) IBW/kg (Calculated) : 47.8  Vital Signs: Temp: 98.5 F (36.9 C) (10/22 1202) Temp Source: Oral (10/22 1202) BP: 125/80 (10/22 1345) Pulse Rate: 81 (10/22 1345)  Labs:  Recent Labs  09/22/16 0505 09/23/16 0355 09/24/16 0400 09/24/16 1140  HGB 8.5*  --   --  9.4*  HCT 27.7*  --   --  29.6*  PLT 364  --   --  433*  LABPROT 31.7* 39.2* 44.3*  --   INR 2.99 3.90 4.62*  --   CREATININE  --   --   --  4.70*    Estimated Creatinine Clearance: 11 mL/min (by C-G formula based on SCr of 4.7 mg/dL (H)).   Assessment: 60yof continues on coumadin for aflutter> in SR after TEE/DCCV post op.  Anticoag: s/p heparin preop for IABP, held post-op. IABP removed 9/23.  S/p hep bridge 10/13 INR 2.99>3.99>still increasing 4.6 - will hold again  Nephro: Scr 4 now on iHD  Heme/Onc: H&h 8.5/27.7, Plt 364  Goal of Therapy:  INR 2-2.5 per Dr. Prescott Gum Monitor platelets by anticoagulation protocol: Yes   Plan:  No warfarin again tonight -Daily INR -Education once out of ICU  [place].D. CPP, BCPS Clinical Pharmacist [phone] 09/24/2016 1:53 PM

## 2016-09-24 NOTE — Progress Notes (Signed)
      [address]       [place] [address]             [phone]        CARDIOTHORACIC SURGERY PROGRESS NOTE   R24 Days Post-Op Procedure(s) (LRB): INSERTION OF DIALYSIS CATHETER LEFT INTERNAL JUGULAR VEIN & INSERTION OF TRIPLE LUMEN RIGHT INTERNAL JUGULAR VEIN (Bilateral)  Subjective: No complaints  Objective: Vital signs: BP Readings from Last 1 Encounters:  09/24/16 (!) 141/77   Pulse Readings from Last 1 Encounters:  09/24/16 72   Resp Readings from Last 1 Encounters:  09/24/16 12   Temp Readings from Last 1 Encounters:  09/24/16 97.7 F (36.5 C) (Oral)    Hemodynamics:    Physical Exam:  Rhythm:   sinus  Breath sounds: clear  Heart sounds:  RRR  Incisions:  Clean and dry  Abdomen:  Soft, non-distended, non-tender  Extremities:  Warm, well-perfused    Intake/Output from previous day: 10/21 0701 - 10/22 0700 In: 1115 [NG/GT:1115] Out: 0  Intake/Output this shift: No intake/output data recorded.  Lab Results:  CBC: Recent Labs  09/22/16 0505  WBC 16.1*  HGB 8.5*  HCT 27.7*  PLT 364    BMET: No results for input(s): NA, K, CL, CO2, GLUCOSE, BUN, CREATININE, CALCIUM in the last 72 hours.   PT/INR:   Recent Labs  09/24/16 0400  LABPROT 44.3*  INR 4.62*    CBG (last 3)   Recent Labs  09/24/16 0008 09/24/16 0353 09/24/16 0823  GLUCAP 177* 114* 152*    ABG    Component Value Date/Time   PHART 7.541 (H) 09/08/2016 0412   PCO2ART 29.4 (L) 09/08/2016 0412   PO2ART 120.0 (H) 09/08/2016 0412   HCO3 25.2 09/08/2016 0412   TCO2 26 09/08/2016 0412   ACIDBASEDEF 4.1 (H) 09/02/2016 1850   O2SAT 85.8 09/22/2016 0503    CXR: n/a  Assessment/Plan: S/P Procedure(s) (LRB): INSERTION OF DIALYSIS CATHETER LEFT INTERNAL JUGULAR VEIN & INSERTION OF TRIPLE LUMEN RIGHT INTERNAL JUGULAR VEIN (Bilateral)  Remains stable NSR w/ stable BP Breathing comfortably Strength and mobility slowly improving Tolerating tube  feeds Reportedly voided small amounts of urine  INR up further   Continue current plan  Hold coumadin today  Rexene Alberts, MD 09/24/2016 11:26 AM

## 2016-09-24 NOTE — Progress Notes (Signed)
Arrived to patient room 2S-10.  Reviewed treatment plan and this RN agrees with plan.  Report received from bedside RN, Crystal.  Consent verified.  Patient A & O X 4.   Lung sounds diminished to ausculation in all fields. Generalized non-pitting edema. Cardiac:  NSR.  Removed caps and cleansed LIJ catheter with chlorhedxidine.  Aspirated ports of heparin and flushed them with saline per protocol.  Connected and secured lines, initiated treatment at 1125.  UF Goal of 3555mL and net fluid removal 3L.  Will continue to monitor.

## 2016-09-24 NOTE — Progress Notes (Signed)
[facility] ROUNDING NOTE   Subjective:   Interval History:  Comfortable in bed with no complaints this am   Objective:  Vital signs in last 24 hours:  Temp:  [97.6 F (36.4 C)-98.6 F (37 C)] 97.8 F (36.6 C) (10/22 0355) Pulse Rate:  [69-85] 69 (10/22 0700) Resp:  [10-18] 12 (10/22 0700) BP: (115-176)/(58-149) 134/71 (10/22 0700) SpO2:  [98 %-100 %] 100 % (10/22 0700) FiO2 (%):  [28 %] 28 % (10/22 0417) Weight:  [63.3 kg (139 lb 8.8 oz)] 63.3 kg (139 lb 8.8 oz) (10/22 0500)  Weight change: 0.7 kg (1 lb 8.7 oz) Filed Weights   09/22/16 0500 09/23/16 0341 09/24/16 0500  Weight: 64 kg (141 lb 1.5 oz) 62.6 kg (138 lb 0.1 oz) 63.3 kg (139 lb 8.8 oz)    Intake/Output: I/O last 3 completed shifts: In: 6237 [NG/GT:1845] Out: 0    Intake/Output this shift:  No intake/output data recorded.  General: alert Neuro: Normal strength HEENT: Trach site clean Cardiovascular: Regular, 2/6 murmur Lungs: No wheeze Abdomen: Soft, non tender Musculoskeletal: 1+ edema Skin: No rashes   Basic Metabolic Panel:  Recent Labs Lab 09/18/16 0457 09/19/16 0345 09/20/16 0500 09/21/16 0733  NA 132* 134* 131* 127*  K 4.0 3.2* 4.0 4.1  CL 95* 98* 96* 91*  CO2 23 28 25 24   GLUCOSE 138* 160* 104* 141*  BUN 98* 44* 31* 71*  CREATININE 4.39* 2.60* 1.94* 3.34*  CALCIUM 9.0 8.0* 8.3* 8.3*  PHOS 5.9* 3.0 1.8* 3.3    Liver Function Tests:  Recent Labs Lab 09/18/16 0457 09/19/16 0345 09/20/16 0500 09/21/16 0733  ALBUMIN 2.5* 2.2* 2.4* 2.3*   No results for input(s): LIPASE, AMYLASE in the last 168 hours. No results for input(s): AMMONIA in the last 168 hours.  CBC:  Recent Labs Lab 09/18/16 0457 09/19/16 0345 09/20/16 0500 09/21/16 0417 09/22/16 0505  WBC 17.7* 18.6* 22.0* 18.2* 16.1*  HGB 8.4* 8.1* 8.9* 8.9* 8.5*  HCT 27.3* 26.1* 28.3* 28.1* 27.7*  MCV 93.5 93.9 92.5 91.8 91.4  PLT 413* 380 400 426* 364    Cardiac Enzymes: No results for  input(s): CKTOTAL, CKMB, CKMBINDEX, TROPONINI in the last 168 hours.  BNP: Invalid input(s): POCBNP  CBG:  Recent Labs Lab 09/23/16 1135 09/23/16 1557 09/23/16 2002 09/24/16 0008 09/24/16 0353  GLUCAP 190* 138* 133* 177* 114*    Microbiology: Results for orders placed or performed during the hospital encounter of 08/18/16  Surgical pcr screen     Status: None   Collection Time: 08/18/16  2:54 PM  Result Value Ref Range Status   MRSA, PCR NEGATIVE NEGATIVE Final   Staphylococcus aureus NEGATIVE NEGATIVE Final    Comment:        The Xpert SA Assay (FDA approved for NASAL specimens in patients over 60 years of age), is one component of a comprehensive surveillance program.  Test performance has been validated by [facility] for patients greater than or equal to 68 year old. It is not intended to diagnose infection nor to guide or monitor treatment.   Culture, respiratory (NON-Expectorated)     Status: None   Collection Time: 08/18/16  6:04 PM  Result Value Ref Range Status   Specimen Description TRACHEAL ASPIRATE  Final   Special Requests Normal  Final   Gram Stain   Final    MODERATE WBC PRESENT,BOTH PMN AND MONONUCLEAR ABUNDANT GRAM POSITIVE COCCI IN PAIRS AND CHAINS RARE YEAST    Culture   Final  MODERATE STREPTOCOCCUS AGALACTIAE TESTING AGAINST S. AGALACTIAE NOT ROUTINELY PERFORMED DUE TO PREDICTABILITY OF AMP/PEN/VAN SUSCEPTIBILITY. FEW STAPHYLOCOCCUS AUREUS    Report Status 08/22/2016 FINAL  Final   Organism ID, Bacteria STAPHYLOCOCCUS AUREUS  Final      Susceptibility   Staphylococcus aureus - MIC*    CIPROFLOXACIN <=0.5 SENSITIVE Sensitive     ERYTHROMYCIN 0.5 SENSITIVE Sensitive     GENTAMICIN <=0.5 SENSITIVE Sensitive     OXACILLIN <=0.25 SENSITIVE Sensitive     TETRACYCLINE <=1 SENSITIVE Sensitive     VANCOMYCIN 1 SENSITIVE Sensitive     TRIMETH/SULFA <=10 SENSITIVE Sensitive     CLINDAMYCIN <=0.25 SENSITIVE Sensitive     RIFAMPIN <=0.5  SENSITIVE Sensitive     Inducible Clindamycin NEGATIVE Sensitive     * FEW STAPHYLOCOCCUS AUREUS  Culture, blood (Routine X 2) w Reflex to ID Panel     Status: None   Collection Time: 08/18/16  6:50 PM  Result Value Ref Range Status   Specimen Description BLOOD RIGHT ANTECUBITAL  Final   Special Requests BOTTLES DRAWN AEROBIC AND ANAEROBIC 5CC  Final   Culture NO GROWTH 5 DAYS  Final   Report Status 08/23/2016 FINAL  Final  Culture, blood (Routine X 2) w Reflex to ID Panel     Status: None   Collection Time: 08/18/16  7:00 PM  Result Value Ref Range Status   Specimen Description BLOOD RIGHT HAND  Final   Special Requests BOTTLES DRAWN AEROBIC AND ANAEROBIC 3CC  Final   Culture NO GROWTH 5 DAYS  Final   Report Status 08/23/2016 FINAL  Final  Surgical pcr screen     Status: Abnormal   Collection Time: 08/22/16  8:32 PM  Result Value Ref Range Status   MRSA, PCR NEGATIVE NEGATIVE Final   Staphylococcus aureus POSITIVE (A) NEGATIVE Final    Comment:        The Xpert SA Assay (FDA approved for NASAL specimens in patients over 60 years of age), is one component of a comprehensive surveillance program.  Test performance has been validated by [facility] for patients greater than or equal to 17 year old. It is not intended to diagnose infection nor to guide or monitor treatment.   Culture, blood (routine x 2)     Status: Abnormal   Collection Time: 08/27/16  9:00 AM  Result Value Ref Range Status   Specimen Description BLOOD LEFT HAND  Final   Special Requests IN PEDIATRIC BOTTLE 3CC  Final   Culture  Setup Time   Final    GRAM POSITIVE COCCI IN CLUSTERS IN PEDIATRIC BOTTLE Organism ID to follow CRITICAL RESULT CALLED TO, READ BACK BY AND VERIFIED WITH: A. Pablo Lawrence.D. 16:15 08/29/16 ([email or web address])    Culture (A)  Final    STAPHYLOCOCCUS SPECIES (COAGULASE NEGATIVE) THE SIGNIFICANCE OF ISOLATING THIS ORGANISM FROM A SINGLE SET OF BLOOD CULTURES WHEN MULTIPLE SETS ARE DRAWN IS  UNCERTAIN. PLEASE NOTIFY THE MICROBIOLOGY DEPARTMENT WITHIN ONE WEEK IF SPECIATION AND SENSITIVITIES ARE REQUIRED.    Report Status 08/30/2016 FINAL  Final  Blood Culture ID Panel (Reflexed)     Status: Abnormal   Collection Time: 08/27/16  9:00 AM  Result Value Ref Range Status   Enterococcus species NOT DETECTED NOT DETECTED Final   Listeria monocytogenes NOT DETECTED NOT DETECTED Final   Staphylococcus species DETECTED (A) NOT DETECTED Final    Comment: CRITICAL RESULT CALLED TO, READ BACK BY AND VERIFIED WITH: A. Pablo Lawrence.D. 16:15 08/29/16 ([email or web address])  Staphylococcus aureus NOT DETECTED NOT DETECTED Final   Methicillin resistance DETECTED (A) NOT DETECTED Final    Comment: CRITICAL RESULT CALLED TO, READ BACK BY AND VERIFIED WITH: A. Pablo Lawrence.D. 16:15 08/29/16 ([email or web address])    Streptococcus species NOT DETECTED NOT DETECTED Final   Streptococcus agalactiae NOT DETECTED NOT DETECTED Final   Streptococcus pneumoniae NOT DETECTED NOT DETECTED Final   Streptococcus pyogenes NOT DETECTED NOT DETECTED Final   Acinetobacter baumannii NOT DETECTED NOT DETECTED Final   Enterobacteriaceae species NOT DETECTED NOT DETECTED Final   Enterobacter cloacae complex NOT DETECTED NOT DETECTED Final   Escherichia coli NOT DETECTED NOT DETECTED Final   Klebsiella oxytoca NOT DETECTED NOT DETECTED Final   Klebsiella pneumoniae NOT DETECTED NOT DETECTED Final   Proteus species NOT DETECTED NOT DETECTED Final   Serratia marcescens NOT DETECTED NOT DETECTED Final   Haemophilus influenzae NOT DETECTED NOT DETECTED Final   Neisseria meningitidis NOT DETECTED NOT DETECTED Final   Pseudomonas aeruginosa NOT DETECTED NOT DETECTED Final   Candida albicans NOT DETECTED NOT DETECTED Final   Candida glabrata NOT DETECTED NOT DETECTED Final   Candida krusei NOT DETECTED NOT DETECTED Final   Candida parapsilosis NOT DETECTED NOT DETECTED Final   Candida tropicalis NOT DETECTED NOT DETECTED Final  Culture,  blood (routine x 2)     Status: None   Collection Time: 08/27/16  9:19 AM  Result Value Ref Range Status   Specimen Description BLOOD RIGHT ASSIST CONTROL  Final   Special Requests BOTTLES DRAWN AEROBIC ONLY 4CC  Final   Culture NO GROWTH 5 DAYS  Final   Report Status 09/01/2016 FINAL  Final  C difficile quick scan w PCR reflex     Status: None   Collection Time: 09/01/16  8:20 AM  Result Value Ref Range Status   C Diff antigen NEGATIVE NEGATIVE Final   C Diff toxin NEGATIVE NEGATIVE Final   C Diff interpretation No C. difficile detected.  Final  Culture, blood (Routine X 2) w Reflex to ID Panel     Status: None   Collection Time: 09/02/16 10:20 AM  Result Value Ref Range Status   Specimen Description BLOOD RIGHT ANTECUBITAL  Final   Special Requests BOTTLES DRAWN AEROBIC ONLY 5CC  Final   Culture NO GROWTH 5 DAYS  Final   Report Status 09/07/2016 FINAL  Final  Culture, blood (Routine X 2) w Reflex to ID Panel     Status: None   Collection Time: 09/02/16 10:25 AM  Result Value Ref Range Status   Specimen Description BLOOD BLOOD RIGHT HAND  Final   Special Requests BOTTLES DRAWN AEROBIC ONLY 5CC  Final   Culture NO GROWTH 5 DAYS  Final   Report Status 09/07/2016 FINAL  Final  Culture, respiratory (NON-Expectorated)     Status: None   Collection Time: 09/03/16  7:48 AM  Result Value Ref Range Status   Specimen Description TRACHEAL ASPIRATE  Final   Special Requests NONE  Final   Gram Stain   Final    ABUNDANT WBC PRESENT,BOTH PMN AND MONONUCLEAR RARE SQUAMOUS EPITHELIAL CELLS PRESENT NO ORGANISMS SEEN    Culture FEW ENTEROBACTER SPECIES  Final   Report Status 09/06/2016 FINAL  Final   Organism ID, Bacteria ENTEROBACTER SPECIES  Final      Susceptibility   Enterobacter species - MIC*    CEFAZOLIN >=64 RESISTANT Resistant     CEFEPIME 2 RESISTANT Resistant     CEFTAZIDIME >=64 RESISTANT Resistant  CEFTRIAXONE >=64 RESISTANT Resistant     CIPROFLOXACIN <=0.25 SENSITIVE  Sensitive     GENTAMICIN <=1 SENSITIVE Sensitive     IMIPENEM 1 SENSITIVE Sensitive     TRIMETH/SULFA <=20 SENSITIVE Sensitive     PIP/TAZO >=128 RESISTANT Resistant     * FEW ENTEROBACTER SPECIES  Culture, respiratory (NON-Expectorated)     Status: None   Collection Time: 09/14/16  8:37 AM  Result Value Ref Range Status   Specimen Description TRACHEAL ASPIRATE  Final   Special Requests NONE  Final   Gram Stain   Final    ABUNDANT WBC PRESENT,BOTH PMN AND MONONUCLEAR NO ORGANISMS SEEN    Culture FEW CANDIDA ALBICANS  Final   Report Status 09/17/2016 FINAL  Final  Culture, respiratory (NON-Expectorated)     Status: None   Collection Time: 09/19/16  1:00 PM  Result Value Ref Range Status   Specimen Description TRACHEAL ASPIRATE  Final   Special Requests NONE  Final   Gram Stain   Final    FEW WBC PRESENT, PREDOMINANTLY PMN FEW GRAM POSITIVE COCCI IN PAIRS AND CHAINS RARE GRAM POSITIVE COCCI IN CLUSTERS RARE YEAST    Culture Consistent with normal respiratory flora.  Final   Report Status 09/21/2016 FINAL  Final    Coagulation Studies:  Recent Labs  09/22/16 0505 09/23/16 0355 09/24/16 0400  LABPROT 31.7* 39.2* 44.3*  INR 2.99 3.90 4.62*    Urinalysis: No results for input(s): COLORURINE, LABSPEC, PHURINE, GLUCOSEU, HGBUR, BILIRUBINUR, KETONESUR, PROTEINUR, UROBILINOGEN, NITRITE, LEUKOCYTESUR in the last 72 hours.  Invalid input(s): APPERANCEUR    Imaging: No results found.   Medications:   . sodium chloride     . amiodarone  200 mg Oral BID  . atorvastatin  20 mg Oral q1800  . chlorhexidine  15 mL Mouth Rinse BID  . clopidogrel  75 mg Oral Daily  . darbepoetin (ARANESP) injection - NON-DIALYSIS  100 mcg Subcutaneous Q Mon-1800  . famotidine  20 mg Oral Daily  . feeding supplement (NEPRO CARB STEADY)  1,000 mL Per Tube Q24H  . feeding supplement (PRO-STAT SUGAR FREE 64)  30 mL Per Tube TID  . Gerhardt's butt cream   Topical BID  . glycopyrrolate  1 mg  Per Tube BID  . guaiFENesin  30 mL Per Tube Q6H  . insulin aspart  0-24 Units Subcutaneous Q4H  . insulin detemir  10 Units Subcutaneous BID  . mouth rinse  15 mL Mouth Rinse q12n4p  . midodrine  10 mg Oral TID WC  . multivitamin  15 mL Per Tube Daily  . Warfarin - Pharmacist Dosing Inpatient   Does not apply q1800   albuterol, HYDROcodone-acetaminophen, ondansetron (ZOFRAN) IV, sodium chloride flush  Assessment/ Plan:  60 yo female from [facility] 9/12 with STEMI s/p cath to [place]. Developed cardiogenic shock, CAP with Group B Strep, AKI, HCAP with Enterobacter. Failed to wean and required trach.  1. Acute on chronic renal disease will continue with dialysis S/p cath/MVR CABG x 1 anuric and requiring dialysis schedule TTS   Patient is deemed end stage renal disease but will watch for recovery since 9/15 ( started on CRRT at [facility]) 2. S/P STEMI 3. Diabetes reasonable 4.Anemia on aranesp Hb 8.5  5. Hypoxic respiratory Failure s/p trach CAP with Group B strep >> completed Abx 9/23. HCAP with Enterobacter >>completed Abx 10/14 She appears to be doing well and will plan to continue TTS dialysis  -- she was not dialyzed yesterday and will dialyze  today   LOS: 25 Geraldene Eisel W @TODAY @8 :15 AM

## 2016-09-25 ENCOUNTER — Inpatient Hospital Stay (HOSPITAL_COMMUNITY): Payer: Medicaid Other

## 2016-09-25 LAB — COMPREHENSIVE METABOLIC PANEL
ALT: 36 U/L (ref 14–54)
AST: 33 U/L (ref 15–41)
Albumin: 2.6 g/dL — ABNORMAL LOW (ref 3.5–5.0)
Alkaline Phosphatase: 111 U/L (ref 38–126)
Anion gap: 11 (ref 5–15)
BILIRUBIN TOTAL: 0.8 mg/dL (ref 0.3–1.2)
BUN: 41 mg/dL — AB (ref 6–20)
CHLORIDE: 96 mmol/L — AB (ref 101–111)
CO2: 28 mmol/L (ref 22–32)
CREATININE: 2.6 mg/dL — AB (ref 0.44–1.00)
Calcium: 8.3 mg/dL — ABNORMAL LOW (ref 8.9–10.3)
GFR, EST AFRICAN AMERICAN: 22 mL/min — AB (ref >60)
GFR, EST NON AFRICAN AMERICAN: 19 mL/min — AB (ref >60)
Glucose, Bld: 89 mg/dL (ref 65–99)
POTASSIUM: 3.4 mmol/L — AB (ref 3.5–5.1)
Sodium: 135 mmol/L (ref 135–145)
TOTAL PROTEIN: 6.5 g/dL (ref 6.5–8.1)

## 2016-09-25 LAB — GLUCOSE, CAPILLARY
GLUCOSE-CAPILLARY: 112 mg/dL — AB (ref 65–99)
Glucose-Capillary: 152 mg/dL — ABNORMAL HIGH (ref 65–99)
Glucose-Capillary: 246 mg/dL — ABNORMAL HIGH (ref 65–99)
Glucose-Capillary: 82 mg/dL (ref 65–99)
Glucose-Capillary: 253 mg/dL — ABNORMAL HIGH (ref 65–99)

## 2016-09-25 LAB — CBC
HEMATOCRIT: 29.7 % — AB (ref 36.0–46.0)
Hemoglobin: 9.3 g/dL — ABNORMAL LOW (ref 12.0–15.0)
MCH: 28.4 pg (ref 26.0–34.0)
MCHC: 31.3 g/dL (ref 30.0–36.0)
MCV: 90.5 fL (ref 78.0–100.0)
PLATELETS: 388 10*3/uL (ref 150–400)
RBC: 3.28 MIL/uL — ABNORMAL LOW (ref 3.87–5.11)
RDW: 15.9 % — AB (ref 11.5–15.5)
WBC: 15.7 10*3/uL — AB (ref 4.0–10.5)

## 2016-09-25 LAB — PREALBUMIN: PREALBUMIN: 23.3 mg/dL (ref 18–38)

## 2016-09-25 LAB — PROTIME-INR
INR: 2.65
Prothrombin Time: 28.8 seconds — ABNORMAL HIGH (ref 11.4–15.2)

## 2016-09-25 MED ORDER — WARFARIN SODIUM 1 MG PO TABS
1.0000 mg | ORAL_TABLET | Freq: Once | ORAL | Status: AC
  Administered 2016-09-25: 1 mg via ORAL
  Filled 2016-09-25: qty 1

## 2016-09-25 NOTE — Plan of Care (Signed)
Problem: Activity: Goal: Risk for activity intolerance will decrease Outcome: Progressing Working with PT and OT

## 2016-09-25 NOTE — Progress Notes (Signed)
RT NOTE:  Pt refuses trach suction.

## 2016-09-25 NOTE — Progress Notes (Signed)
Occupational Therapy Treatment Patient Details Name: Deborah [name] MRN: [id number] DOB: June 28, 1956 Today's Date: 09/25/2016    History of present illness 60 yo admitted to [facility] 9/12 with STEMI develped cardiogenic shock with ETT and cRRT initiated on 9/14 after cath, transfer to [facility] 2/20, metabolic acidosis, underwent MVR 9/20, extubated 9/29, reintubated 9/30, 9/25-9/29 hemiplegia developed and resolved, trach and bronchoscopy 10/5. PMHx: DM, HTN, HLD   OT comments  Pt continues with steady progress.  She demonstrates increasing activity tolerance.  She was able to move sit to stand with min A +2.  As she fatigues, she demonstrates poor safety awareness and thus is very high risk for falls.  VSS throughout.  Continue to recommend CIR.   Follow Up Recommendations  CIR;Supervision/Assistance - 24 hour    Equipment Recommendations  3 in 1 bedside comode    Recommendations for Other Services Rehab consult    Precautions / Restrictions Precautions Precautions: Sternal;Fall Precaution Comments: trach, flexiseal, panda Restrictions Weight Bearing Restrictions: Yes (sternal precautions)       Mobility Bed Mobility Overal bed mobility: Needs Assistance;+2 for physical assistance Bed Mobility: Sit to Supine       Sit to supine: Mod assist;+2 for physical assistance   General bed mobility comments: When transferrring back to bed, pt fatigued and spontaneously fell back on bed requiring assist to prevent her from sliding to floor and assist to lift LE onto bed   Transfers Overall transfer level: Needs assistance Equipment used: Rolling walker (2 wheeled) Transfers: Sit to/from [facility] Sit to Stand: Min assist;+2 physical assistance Stand pivot transfers: Mod assist;+2 physical assistance       General transfer comment: Pt requires assist to lift buttocks from chair.  She requires max cues and mod A for hand placement on walker.  Pt noted to scissor feet when  transferring which is worse as she fatigues.  Pt progressively requiring more assist as she fatigues.     Balance Overall balance assessment: Needs assistance Sitting-balance support: Feet supported Sitting balance-Leahy Scale: Poor Sitting balance - Comments: requitrf mod A due to fatigue    Standing balance support: Bilateral upper extremity supported Standing balance-Leahy Scale: Poor                     ADL Overall ADL's : Needs assistance/impaired                         Toilet Transfer: Moderate assistance;+2 for physical assistance;Stand-pivot;BSC;RW Toilet Transfer Details (indicate cue type and reason): Pt demonstrates poor safety awareness.  Requires max cues for hand placement          Functional mobility during ADLs: Moderate assistance;+2 for physical assistance        Vision                     Perception     Praxis      Cognition   Behavior During Therapy: Flat affect Overall Cognitive Status: Impaired/Different from baseline Area of Impairment: Attention;Memory;Following commands;Safety/judgement;Awareness;Problem solving   Current Attention Level: Sustained Memory: Decreased recall of precautions;Decreased short-term memory  Following Commands: Follows one step commands consistently Safety/Judgement: Decreased awareness of safety;Decreased awareness of deficits   Problem Solving: Slow processing;Decreased initiation;Difficulty sequencing;Requires verbal cues;Requires tactile cues General Comments: Pt demonstrates poor safety awareness     Extremity/Trunk Assessment               Exercises  Shoulder Instructions       General Comments      Pertinent Vitals/ Pain       Pain Assessment: Faces Faces Pain Scale: No hurt  Home Living                                          Prior Functioning/Environment              Frequency  Min 3X/week        Progress Toward Goals  OT  Goals(current goals can now be found in the care plan section)  Progress towards OT goals: Progressing toward goals     Plan Discharge plan remains appropriate    Co-evaluation                 End of Session Equipment Utilized During Treatment: Oxygen;Gait belt;Rolling walker   Activity Tolerance Patient tolerated treatment well   Patient Left in bed;with call bell/phone within reach;with nursing/sitter in room   Nurse Communication Mobility status        Time: 0071-2197 OT Time Calculation (min): 21 min  Charges: OT General Charges $OT Visit: 1 Procedure OT Treatments $Therapeutic Activity: 8-22 mins  Zakhia Seres M 09/25/2016, 3:46 PM

## 2016-09-25 NOTE — Plan of Care (Signed)
Problem: Urinary Elimination: Goal: Ability to achieve and maintain adequate renal perfusion and functioning will improve Outcome: Not Progressing Pt is on hemodialysis

## 2016-09-25 NOTE — Progress Notes (Signed)
PT recommending CIR once medically stable for discharge. CSW continues to follow patient for support and facilitate discharge to SNF, if needed, once medically stable.      Emiliano Dyer, LCSW [facility] ED/3M Clinical Social Worker [phone]

## 2016-09-25 NOTE — Progress Notes (Signed)
Modified Barium Swallow Progress Note  Patient Details  Name: Deborah [name] MRN: [id number] Date of Birth: 02-27-1956  Today's Date: 09/25/2016  Modified Barium Swallow completed.  Full report located under Chart Review in the Imaging Section.  Brief recommendations include the following:  Clinical Impression  Pt wore PMV for MBS. She presents with mild oral and moderate pharyngeal dysphagia. She had mild lingual/palatal residue for all consistencies consumed, that cleared with spontaneous and cued additional swallows. A delay in swallow initation to the valleculae and reduced base of tongue retraction was observed for all consistencies. She had episodes of flash penetration with initial sips of thin liquid; however, as the study continued pt presented with silent pentration that required cues for throat clearing in order for penetrates to clear. Pt initially silently penetrated nectar thick liquid, but when cued for a chin tuck pt had adequate airway protection via cup and straw sips. Pt showed piecemeal swallowing for pureed and regular solids. Recommend Dys 2 diet and nectar thick liquids with a chin tuck. Will continue to follow acutely for diet tolerance.   Swallow Evaluation Recommendations       SLP Diet Recommendations: Dysphagia 2 (Fine chop) solids;Nectar thick liquid   Liquid Administration via: Cup;Straw   Medication Administration: Whole meds with puree   Supervision: Staff to assist with self feeding;Full supervision/cueing for compensatory strategies   Compensations: Small sips/bites;Slow rate;Minimize environmental distractions;Chin tuck   Postural Changes: Remain semi-upright after after feeds/meals (Comment);Seated upright at 90 degrees   Oral Care Recommendations: Oral care BID   Other Recommendations: Order thickener from pharmacy;Prohibited food (jello, ice cream, thin soups);Remove water pitcher;Place PMSV during PO intake   Ezekiel Slocumb, Student  SLP  Shela Leff 09/25/2016,3:29 PM

## 2016-09-25 NOTE — Progress Notes (Signed)
Nutrition Follow Up  DOCUMENTATION CODES:   Not applicable  INTERVENTION:    Continue Nepro formula at goal rate of 35 ml/hr with Prostat liquid protein 30 ml TID  TF regimen providing 1812 kcals, 113 gm protein, 585 ml of free water  NUTRITION DIAGNOSIS:   Inadequate oral intake related to inability to eat as evidenced by NPO status; ongoing  GOAL:   Patient will meet greater than or equal to 90% of their needs; met  MONITOR:   Diet advancement, TF tolerance, Labs, Weight trends, Skin, I & O's  ASSESSMENT:   60 y/o woman with DM2, HTN, HL and noncompliance presented to [facility] ER with infrerior posterior STEMI on 9/12. Taken to cath lab and found to have occluded small to moderate OM-1 branch. Underwent successful PCS with stent x 2 by Dr. Clayborn Bigness. Coronaries otherwise ok. EF 55-60% with severe MR confirmed by echo.    Pt s/p procedures 9/20: MITRAL VALVE REPAIR (MVR)  CORONARY ARTERY BYPASS GRAFTING (CABG)    Pt continues on trach collar. Acute on CKD 3 >> Nephrology following >> on HD. Nepro formula infusing at goal rate of 35 ml/hr via CORTRAK feeding tube; tolerating well. Speech Path following >> for possible MBSS today. Labs and medications reviewed.  CBG (last 3)   Recent Labs  09/25/16 0338 09/25/16 0812 09/25/16 1133  GLUCAP 112* 152* 246*   Diet Order:  Diet NPO time specified  Skin:  Reviewed, no issues  Last BM:  10/23  Height:   Ht Readings from Last 1 Encounters:  09/18/16 5' 1"  (1.549 m)    Weight: >>> stable  Wt Readings from Last 1 Encounters:  09/25/16 137 lb 5.6 oz (62.3 kg)   10/22  139 lb 10/21  138 lb 10/20  141 lb 10/19  141 lb 10/18  139 lb 10/17  140 lb 10/16  147 lb 10/15  144 lb 10/14  142 lb 10/13  146 lb 10/12  143 lb 10/11  139 lb  Ideal Body Weight:  47.7 kg  BMI:  Body mass index is 25.95 kg/m.  Estimated Nutritional Needs:   Kcal:  1700-1900  Protein:  100-110 gm  Fluid:  per MD  EDUCATION  NEEDS:   No education needs identified at this time  Arthur Holms, RD, LDN Pager #: [phone] After-Hours Pager #: [phone]

## 2016-09-25 NOTE — Progress Notes (Signed)
Speech Language Pathology Treatment: Dysphagia;Passy Muir Speaking valve  Patient Details Name: Deborah [name] MRN: [id number] DOB: 1955/12/19 Today's Date: 09/25/2016 Time: 1660-6301 SLP Time Calculation (min) (ACUTE ONLY): 18 min  Assessment / Plan / Recommendation Clinical Impression  Pt wore PMV with stable VS for the duration of the session. She presented with improved dysphonic vocal quality, that remains hoarse and low intensity. She has reduced intelligibility that is most prevalent at the sentence level. SLP began treatment for independent placement of PMV and pt politely declined due to reduced finger dexterity. She consumed trials of ice chips, thin liquid, and pureed solids with PMV in place with no s/s of aspiration. SLP noted multiple swallows for thin liquid and pureed trials. Recommend continued PMV use with staff and during therapies under full supervision. Given improved performance with PO's, recommend proceeding with MBS to determine initiation for safest diet.   HPI HPI: 60 yo female admitted to [facility] 08/15/16 with STEMI s/p cath to [place]. Developed cardiogenic shock, VDRF (ETT 9/14-9/29), AKI with metabolic acidosis and started on CRRT 08/17/16. Transferred to [facility] to tx shock and evaluated new severe MR from papillary muscle rupture. She also had fever and found to have Group B Strep in sputum culture.Mitral valve replacement 9/20. Swallow evaluation 9/30 with recs for NPO given poor mentation, severely impaired swallow function s/p prolonged intubation. Reintubated 9/30 secondary to septic shock; trach 10/5; new onset right hemiplegia and right gaze preference.  CT negative for acute changes; dx acute encephalopathy.       SLP Plan  Continue with current plan of care     Recommendations  Diet recommendations: NPO;Other(comment) (ice chips) Medication Administration: Via alternative means Supervision: Full supervision/cueing for compensatory strategies Postural Changes  and/or Swallow Maneuvers: Seated upright 90 degrees      Patient may use Passy-Muir Speech Valve: Intermittently with supervision;During all therapies with supervision PMSV Supervision: Full         Oral Care Recommendations: Oral care prior to ice chip/H20;Oral care QID Follow up Recommendations: Inpatient Rehab Plan: Continue with current plan of care       [place], Student SLP  Shela Leff 09/25/2016, 11:04 AM

## 2016-09-25 NOTE — Progress Notes (Signed)
ANTICOAGULATION CONSULT NOTE - Follow Up Consult  Pharmacy Consult for Coumadin Indication: Aflutter  No Known Allergies  Patient Measurements: Height: 5\' 1"  (154.9 cm) Weight: 137 lb 5.6 oz (62.3 kg) IBW/kg (Calculated) : 47.8  Vital Signs: Temp: 99 F (37.2 C) (10/23 0700) Temp Source: Oral (10/23 0700) BP: 121/83 (10/23 0857) Pulse Rate: 93 (10/23 0857)  Labs:  Recent Labs  09/23/16 0355 09/24/16 0400 09/24/16 1140 09/25/16 0400  HGB  --   --  9.4* 9.3*  HCT  --   --  29.6* 29.7*  PLT  --   --  433* 388  LABPROT 39.2* 44.3*  --  28.8*  INR 3.90 4.62*  --  2.65  CREATININE  --   --  4.70* 2.60*    Estimated Creatinine Clearance: 19.7 mL/min (by C-G formula based on SCr of 2.6 mg/dL (H)).   Assessment: 60yof continues on coumadin for aflutter> in SR after TEE/DCCV post-op. S/p heparin pre-op for IABP (removed 9/23), held post-op and d/c'd. Currently new ESRD, appetite improving per MD note. CBC stable, no bleed documented.  Warfarin held x 2 with INR up to 4.62, now slightly supratherapeutic but improved at 2.65 (lower goal per [employer]). Will give small dose to prevent INR from dropping too low after held doses.  Goal of Therapy:  INR 2-2.5 per Dr. Prescott Gum Monitor platelets by anticoagulation protocol: Yes   Plan:  -Warfarin 1mg  x 1 dose tonight -Daily INR -Monitor for s/sx bleeding   Elicia Lamp, PharmD, BCPS Clinical Pharmacist 09/25/2016 10:03 AM

## 2016-09-25 NOTE — Progress Notes (Signed)
Physical Therapy Treatment Patient Details Name: Deborah [name] MRN: [id number] DOB: 1956-03-15 Today's Date: 09/25/2016    History of Present Illness 60 yo admitted to [facility] 9/12 with STEMI develped cardiogenic shock with ETT and cRRT initiated on 9/14 after cath, transfer to [facility] 6/50, metabolic acidosis, underwent MVR 9/20, extubated 9/29, reintubated 9/30, 9/25-9/29 hemiplegia developed and resolved, trach and bronchoscopy 10/5. PMHx: DM, HTN, HLD    PT Comments    Pt continues to progress with transfers and gait distance on RA throughout session with sats 99%. Pt unable to state precautions and educated for all with PMSV utilized throughout session. Pt reports discomfort from abdomen limiting her endurance but willing to sit up in chair end of session. Encouraged increased mobility with nursing. Will continue to follow.   HR 85-92  BP 89/54 before, 121/83 after  Follow Up Recommendations  CIR;Supervision/Assistance - 24 hour     Equipment Recommendations  Rolling walker with 5" wheels    Recommendations for Other Services       Precautions / Restrictions Precautions Precautions: Sternal;Fall Precaution Comments: trach, flexiseal, panda    Mobility  Bed Mobility Overal bed mobility: Needs Assistance Bed Mobility: Supine to Sit     Supine to sit: Min assist     General bed mobility comments: cues for sequence and precautions with min assist to scoot to EOB but pt able to roll and elevate trunk without physical assist today  Transfers Overall transfer level: Needs assistance   Transfers: Sit to/from Stand Sit to Stand: Mod assist         General transfer comment: max cues for hand placement, anterior translation and safety with assist to lift hips from surface x 3 trials   Ambulation/Gait Ambulation/Gait assistance: Mod assist;+2 physical assistance;+2 safety/equipment Ambulation Distance (Feet): 20 Feet Assistive device: Rolling walker (2 wheeled);2 person hand  held assist Gait Pattern/deviations: Narrow base of support;Step-through pattern;Decreased stride length;Trunk flexed   Gait velocity interpretation: Below normal speed for age/gender General Gait Details: initial 62' of gait with bil HHA and max cues for posture, increased stride and distance. Pt requried seated rest due to fatigue and then walked an additional 20' with RW with max cues for posture, safety and balance with mod assist for balance and stability with pt pushing RW to her right and unable to safely control without mod assist. Chair pulled behind for each trial.    Stairs            Wheelchair Mobility    Modified Rankin (Stroke Patients Only)       Balance Overall balance assessment: Needs assistance   Sitting balance-Leahy Scale: Fair       Standing balance-Leahy Scale: Poor                      Cognition Arousal/Alertness: Awake/alert Behavior During Therapy: Flat affect   Area of Impairment: Memory     Memory: Decreased recall of precautions Following Commands: Follows one step commands consistently            Exercises      General Comments        Pertinent Vitals/Pain Pain Score: 4  Pain Location: abdomen Pain Descriptors / Indicators: Cramping Pain Intervention(s): Limited activity within patient's tolerance;Monitored during session;Repositioned    Home Living                      Prior Function  PT Goals (current goals can now be found in the care plan section) Progress towards PT goals: Progressing toward goals    Frequency           PT Plan Current plan remains appropriate    Co-evaluation             End of Session Equipment Utilized During Treatment: Gait belt Activity Tolerance: Patient tolerated treatment well Patient left: in chair;with call bell/phone within reach     Time: 0830-0856 PT Time Calculation (min) (ACUTE ONLY): 26 min  Charges:  $Gait Training: 8-22  mins $Therapeutic Activity: 8-22 mins                    G Codes:      [name] October 04, 2016, 9:57 AM  Elwyn Reach, [place]

## 2016-09-25 NOTE — Progress Notes (Signed)
Inpatient Rehabilitation  Pt. Is currently off the floor for a test.  I spoke with her daughter Deborah [name] over the phone.  Deborah [name] states that the family has met and that pt's daughter Deborah [name] is agreeing to provide 24 hour care for her mother.  Additionally, I spoke with [facility], RNCM regarding this case. She states she has spoken with PCCM and they are anticipating decannulation later in the week if all progresses well.  We will continue to follow along for medical readiness, activity tolerance and bed availability for a possible IP Rehab admission.  I would recommend a back up plan in the event that pt. does not get to come to IP Rehab for any reason.  Please call if questions.  [place] Admissions Coordinator Cell [phone] Office [phone]

## 2016-09-25 NOTE — Progress Notes (Signed)
Patient ID: Deborah [name], female   DOB: 21-Aug-1956, 60 y.o.   MRN: [id number]   ADVANCED HF TEAM  SUBJECTIVE:  Stable thus far off inotrope. Walking about 10 feet with PT. (last seen 09/22/16). Tolerating IHD.  Tolerating HD on midodrine. WBC stable ~ 15.7 this am.   Pt now making small amounts per notes and patient. None recorded thus far. States her appetite is improving.    Scheduled Meds: . amiodarone  200 mg Oral BID  . atorvastatin  20 mg Oral q1800  . chlorhexidine  15 mL Mouth Rinse BID  . clopidogrel  75 mg Oral Daily  . darbepoetin (ARANESP) injection - NON-DIALYSIS  100 mcg Subcutaneous Q Mon-1800  . famotidine  20 mg Oral Daily  . feeding supplement (NEPRO CARB STEADY)  1,000 mL Per Tube Q24H  . feeding supplement (PRO-STAT SUGAR FREE 64)  30 mL Per Tube TID  . Gerhardt's butt cream   Topical BID  . glycopyrrolate  1 mg Per Tube BID  . guaiFENesin  30 mL Per Tube Q6H  . insulin aspart  0-24 Units Subcutaneous Q4H  . insulin detemir  10 Units Subcutaneous BID  . mouth rinse  15 mL Mouth Rinse q12n4p  . midodrine  10 mg Oral TID WC  . multivitamin  15 mL Per Tube Daily  . Warfarin - Pharmacist Dosing Inpatient   Does not apply q1800   Continuous Infusions: . sodium chloride     PRN Meds:.sodium chloride, sodium chloride, albuterol, alteplase, heparin, HYDROcodone-acetaminophen, lidocaine (PF), lidocaine-prilocaine, ondansetron (ZOFRAN) IV, pentafluoroprop-tetrafluoroeth, sodium chloride flush    Vitals:   09/25/16 0500 09/25/16 0600 09/25/16 0700 09/25/16 0857  BP: (!) 98/58 106/63 102/61 121/83  Pulse: 76 77 79 93  Resp: (!) 26 14 14 14   Temp:   99 F (37.2 C)   TempSrc:   Oral   SpO2: 100% 100% 100% 100%  Weight: 137 lb 5.6 oz (62.3 kg)     Height:        Intake/Output Summary (Last 24 hours) at 09/25/16 0954 Last data filed at 09/25/16 0700  Gross per 24 hour  Intake              950 ml  Output             3001 ml  Net            -2051 ml     LABS: Basic Metabolic Panel:  Recent Labs  09/24/16 1140 09/25/16 0400  NA 132* 135  K 3.3* 3.4*  CL 93* 96*  CO2 25 28  GLUCOSE 234* 89  BUN 109* 41*  CREATININE 4.70* 2.60*  CALCIUM 8.7* 8.3*  PHOS 5.2*  --    Liver Function Tests:  Recent Labs  09/24/16 1140 09/25/16 0400  AST  --  33  ALT  --  36  ALKPHOS  --  111  BILITOT  --  0.8  PROT  --  6.5  ALBUMIN 2.5* 2.6*   No results for input(s): LIPASE, AMYLASE in the last 72 hours. CBC:  Recent Labs  09/24/16 1140 09/25/16 0400  WBC 16.5* 15.7*  HGB 9.4* 9.3*  HCT 29.6* 29.7*  MCV 88.6 90.5  PLT 433* 388   Cardiac Enzymes: No results for input(s): CKTOTAL, CKMB, CKMBINDEX, TROPONINI in the last 72 hours. BNP: Invalid input(s): POCBNP D-Dimer: No results for input(s): DDIMER in the last 72 hours. Hemoglobin A1C: No results for input(s): HGBA1C in the last 72  hours. Fasting Lipid Panel: No results for input(s): CHOL, HDL, LDLCALC, TRIG, CHOLHDL, LDLDIRECT in the last 72 hours. Thyroid Function Tests: No results for input(s): TSH, T4TOTAL, T3FREE, THYROIDAB in the last 72 hours.  Invalid input(s): FREET3 Anemia Panel: No results for input(s): VITAMINB12, FOLATE, FERRITIN, TIBC, IRON, RETICCTPCT in the last 72 hours.  PHYSICAL EXAM General: Seated in chair.  Neck: Trach, JVP difficult Lungs: Scattered rhonchi CV: Nondisplaced PMI.  Heart regular S1/S2, no S3/S4, 1/6 SEM USB.   Abdomen: Soft, NT, ND, no HSM. No bruits or masses. +BS  Neurologic: MAE without difficult.  Following commands.  Extremities: No clubbing or cyanosis. R and LLE SCDs.   TELEMETRY: Reviewed, NSR 70s      Assessment:   1. Cardiogenic shock: Due to acute severe MR. EF 70% on 9/25 echo.  2. CAD: Inferior posterior STEMI on 9/13 with DES x 2 to OM-1      --Plavix restarted on 9/22 3. 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     --s/p MVR  08/23/2016  4. AKI on CKD stage III - due to ATN likely from cardiogenic shock + contrast with cath. Has required CVVH.  5. Acute respiratory failure: Pulmonary edema.  6. DM2 7. Suspect component of septic shock: Possible PNA, group B Strep in sputum culture.  8. Anemia 9. PVCs   10. Shock liver 11. MRSA bacteremia 12. Atrial fibrillation: Paroxysmal.  On amiodarone.    Plan/Discussion:    Stable from HF perspective.  Continues to improve. Maintaining NSR. On warfarin/plavix. WBC stable.   Approved for CIR.  We will continue to follow at a distance.   Shirley Friar, PA-C 9:54 AM  Advanced Heart Failure Team Pager [phone] (M-F; 7a - 4p)  Please contact [facility] for night-coverage after hours (4p -7a ) and weekends on [email or web address]  Patient seen and examined with Oda Kilts, PA-C. We discussed all aspects of the encounter. I agree with the assessment and plan as stated above.   She continues to improve slowly. Volume status stable. Going to CIR. Will follow at a distance.   Clare Casto,MD 10:10 AM

## 2016-09-25 NOTE — Progress Notes (Signed)
25 Days Post-Op Procedure(s) (LRB): INSERTION OF DIALYSIS CATHETER LEFT INTERNAL JUGULAR VEIN & INSERTION OF TRIPLE LUMEN RIGHT INTERNAL JUGULAR VEIN (Bilateral) Subjective: Patient had modified barium swallow evaluation which showed improvement The patient is now on dysphagia 2 diet with nectar thick liquid Maintaining sinus rhythm Chest x-ray today's clear CCM recommends plugging trach to see how she handles her secretions White count 15.7 off meropenem INR goal is 1.5-2.0 since she is on Plavix for an OM DES placed at [place] Objective: Vital signs in last 24 hours: Temp:  [98.4 F (36.9 C)-99.5 F (37.5 C)] 99 F (37.2 C) (10/23 1500) Pulse Rate:  [73-93] 77 (10/23 1600) Cardiac Rhythm: Normal sinus rhythm (10/23 1600) Resp:  [12-26] 17 (10/23 1600) BP: (89-134)/(54-106) 108/70 (10/23 1600) SpO2:  [97 %-100 %] 100 % (10/23 1600) FiO2 (%):  [28 %] 28 % (10/23 1310) Weight:  [137 lb 5.6 oz (62.3 kg)] 137 lb 5.6 oz (62.3 kg) (10/23 0500)  Hemodynamic parameters for last 24 hours:  sinus stable blood pressure  Intake/Output from previous day: 10/22 0701 - 10/23 0700 In: 1110 [NG/GT:1110] Out: 3001 [Urine:1] Intake/Output this shift: Total I/O In: 305 [NG/GT:305] Out: -        Exam    General- alert and comfortable   Lungs- clear without rales, wheezes   Cor- regular rate and rhythm, no murmur , gallop   Abdomen- soft, non-tender   Extremities - warm, non-tender, minimal edema   Neuro- oriented, appropriate, no focal weakness   Lab Results:  Recent Labs  09/24/16 1140 09/25/16 0400  WBC 16.5* 15.7*  HGB 9.4* 9.3*  HCT 29.6* 29.7*  PLT 433* 388   BMET:  Recent Labs  09/24/16 1140 09/25/16 0400  NA 132* 135  K 3.3* 3.4*  CL 93* 96*  CO2 25 28  GLUCOSE 234* 89  BUN 109* 41*  CREATININE 4.70* 2.60*  CALCIUM 8.7* 8.3*    PT/INR:  Recent Labs  09/25/16 0400  LABPROT 28.8*  INR 2.65   ABG    Component Value Date/Time   PHART 7.541 (H)  09/08/2016 0412   HCO3 25.2 09/08/2016 0412   TCO2 26 09/08/2016 0412   ACIDBASEDEF 4.1 (H) 09/02/2016 1850   O2SAT 85.8 09/22/2016 0503   CBG (last 3)   Recent Labs  09/25/16 0812 09/25/16 1133 09/25/16 1538  GLUCAP 152* 246* 82    Assessment/Plan: S/P Procedure(s) (LRB): INSERTION OF DIALYSIS CATHETER LEFT INTERNAL JUGULAR VEIN & INSERTION OF TRIPLE LUMEN RIGHT INTERNAL JUGULAR VEIN (Bilateral) Careful assessment on outpatient handles her dysphagia 2 diet Low-dose Coumadin for target INR 1.5-2.0 Continue with physical therapy occupational therapy  LOS: 38 days    Deborah [name] 09/25/2016

## 2016-09-25 NOTE — Progress Notes (Signed)
PULMONARY / CRITICAL CARE MEDICINE   Name: Deborah [name] MRN: [id number] DOB: 08-Aug-1956    ADMISSION DATE:  08/18/2016 CONSULTATION DATE:  08/18/2016  REFERRING MD: Bensimhon  CHIEF COMPLAINT: Chest pain  SUBJECTIVE:  No complaints, phonation with PMV improving, albeit not great. SLP indicates may be ready for some swallowing.  VITAL SIGNS: BP 124/86   Pulse 79   Temp 99 F (37.2 C) (Oral)   Resp 18   Ht 5\' 1"  (1.549 m)   Wt 62.3 kg (137 lb 5.6 oz)   SpO2 100%   BMI 25.95 kg/m   INTAKE / OUTPUT: I/O last 3 completed shifts: In: 4097 [NG/GT:1665] Out: 3001 [Urine:1; Other:3000]  PHYSICAL EXAMINATION: General:  Frail female in NAD Neuro:  Normal strength HEENT:  Trach site clean Cardiovascular:  Regular, 2/6 murmur Lungs:  No wheeze Abdomen:  Soft, non tender Musculoskeletal:  1+ edema Skin:  No rashes  LABS: CMP Latest Ref Rng & Units 09/25/2016 09/24/2016 09/21/2016  Glucose 65 - 99 mg/dL 89 234(H) 141(H)  BUN 6 - 20 mg/dL 41(H) 109(H) 71(H)  Creatinine 0.44 - 1.00 mg/dL 2.60(H) 4.70(H) 3.34(H)  Sodium 135 - 145 mmol/L 135 132(L) 127(L)  Potassium 3.5 - 5.1 mmol/L 3.4(L) 3.3(L) 4.1  Chloride 101 - 111 mmol/L 96(L) 93(L) 91(L)  CO2 22 - 32 mmol/L 28 25 24   Calcium 8.9 - 10.3 mg/dL 8.3(L) 8.7(L) 8.3(L)  Total Protein 6.5 - 8.1 g/dL 6.5 - -  Total Bilirubin 0.3 - 1.2 mg/dL 0.8 - -  Alkaline Phos 38 - 126 U/L 111 - -  AST 15 - 41 U/L 33 - -  ALT 14 - 54 U/L 36 - -    CBC Latest Ref Rng & Units 09/25/2016 09/24/2016 09/22/2016  WBC 4.0 - 10.5 K/uL 15.7(H) 16.5(H) 16.1(H)  Hemoglobin 12.0 - 15.0 g/dL 9.3(L) 9.4(L) 8.5(L)  Hematocrit 36.0 - 46.0 % 29.7(L) 29.6(L) 27.7(L)  Platelets 150 - 400 K/uL 388 433(H) 364    Glucose  Recent Labs Lab 09/24/16 1201 09/24/16 1554 09/24/16 1949 09/24/16 2355 09/25/16 0338 09/25/16 0812  GLUCAP 201* 183* 158* 218* 112* 152*    Imaging Dg Chest Port 1 View  Result Date: 09/25/2016 CLINICAL DATA:  Atelectasis.  EXAM: PORTABLE CHEST 1 VIEW COMPARISON:  09/19/2016 . FINDINGS: Tracheostomy tube, feeding tube, right IJ line, left IJ duo lumen catheter in stable position. Prior cardiac valve replacement. Cardiomegaly with persistent but improved bilateral pulmonary infiltrates consistent with improving pulmonary edema. Improving bilateral pleural effusions. No pneumothorax IMPRESSION: 1.  Lines and tubes in good anatomic position. 2. Prior cardiac valve replacement. Congestive heart failure with bilateral pulmonary edema improved from prior exam. Small residual left pleural effusion improved from prior exam. Previously identified right pleural effusion has cleared. Electronically Signed   By: Marcello Moores  Register   On: 09/25/2016 08:06    STUDIES:  LHC 9/12 >> PCI to OM1, severe MR, EF 55 to 60% RHC 9/15 >> RA 5, RV 37/3/6, PA 38/18/26, PCWP 15, CI 1.9, PVR 3.3 WU TEE 9/15 >> EF 60 to 65%, flail motion MV with severe MR TTE 10/15 >> EF 60 to 65%, mild LVH  CULTURES: Sputum 9/15 >> Group B Strep, Staph aureus Blood 9/24 >> coag negative Staph Sputum 10/01 >> Enterobacter Sputum 10/12 >> Few Candida albicans  SIGNIFICANT EVENTS: 09/12 Admit to [facility] 09/15 To [facility] 09/20 MVR with bioprosthetic valve 09/28 Permcath for HD 09/29 Extubated 09/30 Reintubated 10/05 Trach by Hyman Bible 10/09 Off vent 10/13 Changed to #  4 cuffless trach  DISCUSSION: 60 yo female from [facility] 9/12 with STEMI s/p cath to [place].  Developed cardiogenic shock, CAP with Group B Strep, AKI, HCAP with Enterobacter.  Failed to wean and required trach.  ASSESSMENT / PLAN:  Acute hypoxic respiratory failure 2nd to CAP, STEMI. Failure to wean s/p tracheostomy. CAP with Group B strep >> completed Abx 9/23. HCAP with Enterobacter >> completed Abx 10/14. - trach collar 24/7 - PM valve as able with supervision  - SLP planning modified today or tomorrow - mucinex, robinul - prn albuterol - f/u CXR intermittently  CAD s/p STEMI, Severe MR 2nd to  papillary muscle rupture s/p MVR. A fib. - per cardiology and TCTS  AKI now with stage 5 CKD. - HD per nephrology  Agitated delirium >> improved. - d/c seroquel 10/20  Dysphagia. - f/u with speech therapy  Deconditioning. - PT recommending CIR  PCCM will f/u intermittently for trach management >> call if help needed in between.  Georgann Housekeeper, AGACNP-BC [place] [facility]/Critical Care Pager [phone] or [phone]  09/25/2016 10:49 AM   STAFF NOTE: Linwood Dibbles, MD FACP have personally reviewed patient's available data, including medical history, events of note, physical examination and test results as part of my evaluation. I have discussed with resident/NP and other care providers such as pharmacist, RN and RRT. In addition, I personally evaluated patient and elicited key findings of: awake, alert, no distress in chair, ambulated, has 4 cufless off vent, using pmv with supervision only, need to push PMV further and all day then push for decannulation, swallow on going, will revisit wed and assess PMV strength, continued aggressive PT, doubt would cap this trach, follow secretion status, monitor secretion status through mouth, remains weak  Lavon Paganini. Titus Mould, MD, [place] Pgr: [place] [facility] 09/25/2016 1:12 PM

## 2016-09-25 NOTE — Discharge Instructions (Signed)

## 2016-09-25 NOTE — Progress Notes (Signed)
Admit: 08/18/2016 LOS: 38  69F with AoCKD3 s/p STEMI s/p PCI and infarct of post MV papillary muscle s/p MVR 9/20  Subjective:  HD yesterday,  3L net negative Saturating bed regularly, can't measure U Vol  10/22 0701 - 10/23 0700 In: 1110 [NG/GT:1110] Out: 3001 [Urine:1]  Filed Weights   09/24/16 1100 09/24/16 1525 09/25/16 0500  Weight: 63.3 kg (139 lb 8.8 oz) 60.3 kg (132 lb 15 oz) 62.3 kg (137 lb 5.6 oz)    Scheduled Meds: . amiodarone  200 mg Oral BID  . atorvastatin  20 mg Oral q1800  . chlorhexidine  15 mL Mouth Rinse BID  . clopidogrel  75 mg Oral Daily  . darbepoetin (ARANESP) injection - NON-DIALYSIS  100 mcg Subcutaneous Q Mon-1800  . famotidine  20 mg Oral Daily  . feeding supplement (NEPRO CARB STEADY)  1,000 mL Per Tube Q24H  . feeding supplement (PRO-STAT SUGAR FREE 64)  30 mL Per Tube TID  . Gerhardt's butt cream   Topical BID  . glycopyrrolate  1 mg Per Tube BID  . guaiFENesin  30 mL Per Tube Q6H  . insulin aspart  0-24 Units Subcutaneous Q4H  . insulin detemir  10 Units Subcutaneous BID  . mouth rinse  15 mL Mouth Rinse q12n4p  . midodrine  10 mg Oral TID WC  . multivitamin  15 mL Per Tube Daily  . Warfarin - Pharmacist Dosing Inpatient   Does not apply q1800   Continuous Infusions: . sodium chloride     PRN Meds:.sodium chloride, sodium chloride, albuterol, alteplase, heparin, HYDROcodone-acetaminophen, lidocaine (PF), lidocaine-prilocaine, ondansetron (ZOFRAN) IV, pentafluoroprop-tetrafluoroeth, sodium chloride flush  Current Labs: reviewed    Physical Exam:  Blood pressure 121/83, pulse 93, temperature 99 F (37.2 C), temperature source Oral, resp. rate 14, height 5\' 1"  (1.549 m), weight 62.3 kg (137 lb 5.6 oz), SpO2 100 %. NAD, in chair, awake RRR Clear ant on auscultation No edema L IJ tunneled, c/d/i, bandaged Conversant, aao  A 1. New ESRD, start RRT 9/15; on TTS schedule 1. L IJ TDC 2. SCr on addmistion at [facility] 94mo ago was 1.63 3. No  sig evidence to suggest recovery of GFR thus far 2. CAD s/p STEMI and ruptured post papllary with severe MR s/p MVR 08/23/16 3. DM2 4. Anemia 5. VDRF s/p Trach 6. Anemia on ESA  P 1. Cont on THS schedule, 3K b ath, 2L UF, tight heparin, L IJ TDC 2. Might want to consider AVF surgery prior to trach decannulation 3. No outpatient HD until trach removed  [name] MD 09/25/2016, 9:43 AM   Recent Labs Lab 09/20/16 0500 09/21/16 0733 09/24/16 1140 09/25/16 0400  NA 131* 127* 132* 135  K 4.0 4.1 3.3* 3.4*  CL 96* 91* 93* 96*  CO2 25 24 25 28   GLUCOSE 104* 141* 234* 89  BUN 31* 71* 109* 41*  CREATININE 1.94* 3.34* 4.70* 2.60*  CALCIUM 8.3* 8.3* 8.7* 8.3*  PHOS 1.8* 3.3 5.2*  --     Recent Labs Lab 09/22/16 0505 09/24/16 1140 09/25/16 0400  WBC 16.1* 16.5* 15.7*  HGB 8.5* 9.4* 9.3*  HCT 27.7* 29.6* 29.7*  MCV 91.4 88.6 90.5  PLT 364 433* 388

## 2016-09-26 DIAGNOSIS — N185 Chronic kidney disease, stage 5: Secondary | ICD-10-CM

## 2016-09-26 LAB — CBC
HEMATOCRIT: 27.9 % — AB (ref 36.0–46.0)
Hemoglobin: 8.8 g/dL — ABNORMAL LOW (ref 12.0–15.0)
MCH: 28.6 pg (ref 26.0–34.0)
MCHC: 31.5 g/dL (ref 30.0–36.0)
MCV: 90.6 fL (ref 78.0–100.0)
Platelets: 393 10*3/uL (ref 150–400)
RBC: 3.08 MIL/uL — ABNORMAL LOW (ref 3.87–5.11)
RDW: 15.7 % — AB (ref 11.5–15.5)
WBC: 13.7 10*3/uL — AB (ref 4.0–10.5)

## 2016-09-26 LAB — GLUCOSE, CAPILLARY
GLUCOSE-CAPILLARY: 122 mg/dL — AB (ref 65–99)
GLUCOSE-CAPILLARY: 147 mg/dL — AB (ref 65–99)
GLUCOSE-CAPILLARY: 154 mg/dL — AB (ref 65–99)
Glucose-Capillary: 188 mg/dL — ABNORMAL HIGH (ref 65–99)
Glucose-Capillary: 301 mg/dL — ABNORMAL HIGH (ref 65–99)

## 2016-09-26 LAB — PROTIME-INR
INR: 2.58
Prothrombin Time: 28.2 seconds — ABNORMAL HIGH (ref 11.4–15.2)

## 2016-09-26 LAB — BASIC METABOLIC PANEL
Anion gap: 11 (ref 5–15)
BUN: 76 mg/dL — AB (ref 6–20)
CHLORIDE: 93 mmol/L — AB (ref 101–111)
CO2: 27 mmol/L (ref 22–32)
CREATININE: 3.76 mg/dL — AB (ref 0.44–1.00)
Calcium: 8.5 mg/dL — ABNORMAL LOW (ref 8.9–10.3)
GFR calc Af Amer: 14 mL/min — ABNORMAL LOW (ref >60)
GFR calc non Af Amer: 12 mL/min — ABNORMAL LOW (ref >60)
GLUCOSE: 145 mg/dL — AB (ref 65–99)
POTASSIUM: 3.6 mmol/L (ref 3.5–5.1)
Sodium: 131 mmol/L — ABNORMAL LOW (ref 135–145)

## 2016-09-26 MED ORDER — RESOURCE THICKENUP CLEAR PO POWD
ORAL | Status: DC | PRN
  Administered 2016-10-10: via ORAL
  Filled 2016-09-26 (×2): qty 125

## 2016-09-26 NOTE — Progress Notes (Signed)
26 Days Post-Op Procedure(s) (LRB): INSERTION OF DIALYSIS CATHETER LEFT INTERNAL JUGULAR VEIN & INSERTION OF TRIPLE LUMEN RIGHT INTERNAL JUGULAR VEIN (Bilateral) Subjective: Week after hemodialysis today did not walk in hallway Tolerating oral diet fairly well so we'll stop tube feeds during the day and monitor chlorophyll intake Maintaining sinus rhythm Coumadin on hold for vascular access procedure for HD  Objective: Vital signs in last 24 hours: Temp:  [98.3 F (36.8 C)-99 F (37.2 C)] 98.8 F (37.1 C) (10/24 1609) Pulse Rate:  [71-89] 88 (10/24 1558) Cardiac Rhythm: Normal sinus rhythm (10/24 1044) Resp:  [13-21] 21 (10/24 1558) BP: (98-153)/(49-82) 110/60 (10/24 1027) SpO2:  [99 %-100 %] 99 % (10/24 1558) FiO2 (%):  [28 %] 28 % (10/24 1558) Weight:  [130 lb 1.1 oz (59 kg)-137 lb 9.1 oz (62.4 kg)] 130 lb 1.1 oz (59 kg) (10/24 1027)  Hemodynamic parameters for last 24 hours:  Stable  Intake/Output from previous day: 10/23 0701 - 10/24 0700 In: 1165.4 [I.V.:20; NG/GT:1145.4] Out: -  Intake/Output this shift: No intake/output data recorded.  no edema      Exam    General- alert and comfortable   Lungs- clear without rales, wheezes   Cor- regular rate and rhythm, no murmur , gallop   Abdomen- soft, non-tender   Extremities - warm, non-tender, minimal edema   Neuro- oriented, appropriate, no focal weakness  Lab Results:  Recent Labs  09/25/16 0400 09/26/16 0718  WBC 15.7* 13.7*  HGB 9.3* 8.8*  HCT 29.7* 27.9*  PLT 388 393   BMET:  Recent Labs  09/25/16 0400 09/26/16 0400  NA 135 131*  K 3.4* 3.6  CL 96* 93*  CO2 28 27  GLUCOSE 89 145*  BUN 41* 76*  CREATININE 2.60* 3.76*  CALCIUM 8.3* 8.5*    PT/INR:  Recent Labs  09/26/16 0400  LABPROT 28.2*  INR 2.58   ABG    Component Value Date/Time   PHART 7.541 (H) 09/08/2016 0412   HCO3 25.2 09/08/2016 0412   TCO2 26 09/08/2016 0412   ACIDBASEDEF 4.1 (H) 09/02/2016 1850   O2SAT 85.8 09/22/2016  0503   CBG (last 3)   Recent Labs  09/26/16 0348 09/26/16 1148 09/26/16 1607  GLUCAP 147* 188* 301*    Assessment/Plan: S/P Procedure(s) (LRB): INSERTION OF DIALYSIS CATHETER LEFT INTERNAL JUGULAR VEIN & INSERTION OF TRIPLE LUMEN RIGHT INTERNAL JUGULAR VEIN (Bilateral)  DC central line after PIV placed   LOS: 39 days    Tharon Aquas Trigt III 09/26/2016

## 2016-09-26 NOTE — Progress Notes (Signed)
Speech Language Pathology Treatment: Dysphagia;Passy Muir Speaking valve  Patient Details Name: Deborah [name] MRN: [id number] DOB: 1956/03/14 Today's Date: 09/26/2016 Time: 1448-1500 SLP Time Calculation (min) (ACUTE ONLY): 12 min  Assessment / Plan / Recommendation Clinical Impression  Pt wore PMV for PO trials with stable VS throughout tx. Her voice remained hoarse, but is continuing to improve with intelligibility and loudness. She was repositioned x2 during session to aid in facilitating intake. She consumed nectar thick liquid, pureed and soft solids. Pt had immediate and delayed coughing after nectar thick liquids while using a chin tuck. Of note, chin tuck is facilitated best when using a straw. Pt was coughing upon arrival and after PO intake; and trace residue was orally suctioned after coughing ceased. While baseline coughing makes it difficult to discern what may or may not be attributed to airway compromise, all of the penetration that occurred during MBS was silent. Recommend continuation of Dys 2 diet and nectar thick liquids with full supervision for chin tuck, which did eliminate penetration during the swallow study. Will continue to closely follow for diet tolerance.   HPI HPI: 60 yo female admitted to [facility] 08/15/16 with STEMI s/p cath to [place]. Developed cardiogenic shock, VDRF (ETT 9/14-9/29), AKI with metabolic acidosis and started on CRRT 08/17/16. Transferred to [facility] to tx shock and evaluated new severe MR from papillary muscle rupture. She also had fever and found to have Group B Strep in sputum culture.Mitral valve replacement 9/20. Swallow evaluation 9/30 with recs for NPO given poor mentation, severely impaired swallow function s/p prolonged intubation. Reintubated 9/30 secondary to septic shock; trach 10/5; new onset right hemiplegia and right gaze preference.  CT negative for acute changes; dx acute encephalopathy.       SLP Plan  Continue with current plan of care      Recommendations  Diet recommendations: Dysphagia 2 (fine chop);Nectar-thick liquid Liquids provided via: Straw Medication Administration: Whole meds with puree Supervision: Full supervision/cueing for compensatory strategies;Staff to assist with self feeding Compensations: Minimize environmental distractions;Slow rate;Small sips/bites;Chin tuck;Use straw to facilitate chin tuck Postural Changes and/or Swallow Maneuvers: Seated upright 90 degrees;Upright 30-60 min after meal                Oral Care Recommendations: Oral care BID Follow up Recommendations: Inpatient Rehab Plan: Continue with current plan of care       [place], Student SLP  Shela Leff 09/26/2016, 4:08 PM

## 2016-09-26 NOTE — Progress Notes (Signed)
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   4K bath, UF goal 2L. SBP ok Saturating bed, can't measure U Vol only 1 void recorded  10/23 0701 - 10/24 0700 In: 1165.4 [I.V.:20; NG/GT:1145.4] Out: -   Filed Weights   09/25/16 0500 09/26/16 0600 09/26/16 0651  Weight: 62.3 kg (137 lb 5.6 oz) 62.4 kg (137 lb 9.1 oz) 61 kg (134 lb 7.7 oz)    Scheduled Meds: . amiodarone  200 mg Oral BID  . atorvastatin  20 mg Oral q1800  . chlorhexidine  15 mL Mouth Rinse BID  . clopidogrel  75 mg Oral Daily  . darbepoetin (ARANESP) injection - NON-DIALYSIS  100 mcg Subcutaneous Q Mon-1800  . famotidine  20 mg Oral Daily  . feeding supplement (NEPRO CARB STEADY)  1,000 mL Per Tube Q24H  . feeding supplement (PRO-STAT SUGAR FREE 64)  30 mL Per Tube TID  . Gerhardt's butt cream   Topical BID  . glycopyrrolate  1 mg Per Tube BID  . guaiFENesin  30 mL Per Tube Q6H  . insulin aspart  0-24 Units Subcutaneous Q4H  . insulin detemir  10 Units Subcutaneous BID  . mouth rinse  15 mL Mouth Rinse q12n4p  . midodrine  10 mg Oral TID WC  . multivitamin  15 mL Per Tube Daily   Continuous Infusions: . sodium chloride     PRN Meds:.albuterol, HYDROcodone-acetaminophen, ondansetron (ZOFRAN) IV, sodium chloride flush  Current Labs: reviewed    Physical Exam:  Blood pressure (!) 143/80, pulse 76, temperature 98.7 F (37.1 C), temperature source Oral, resp. rate 13, height 5\' 1"  (1.549 m), weight 61 kg (134 lb 7.7 oz), SpO2 100 %. NAD, in chair, awake RRR Clear ant on auscultation No edema L IJ tunneled, c/d/i, bandaged Conversant, aao  A 1. New ESRD, start RRT 9/15; on TTS schedule 1. L IJ TDC 2. SCr on addmistion at [facility] 49mo ago was 1.63 3. No sig evidence to suggest recovery of GFR thus far 2. CAD s/p STEMI and ruptured post papllary with severe MR s/p MVR  08/23/16 3. DM2 4. Anemia 5. VDRF s/p Trach 6. Anemia on ESA  P 1. Cont on THS schedule, 3K b ath, 2L UF, tight heparin, L IJ TDC 2. Might want to consider AVF surgery prior to trach decannulation; Will go ahead and ask VVS to eval since on HD > 21mo and BUN+SCr climb quickly on non HD days 3. No outpatient HD until trach removed  [name] MD 09/26/2016, 8:15 AM   Recent Labs Lab 09/20/16 0500 09/21/16 0733 09/24/16 1140 09/25/16 0400 09/26/16 0400  NA 131* 127* 132* 135 131*  K 4.0 4.1 3.3* 3.4* 3.6  CL 96* 91* 93* 96* 93*  CO2 25 24 25 28 27   GLUCOSE 104* 141* 234* 89 145*  BUN 31* 71* 109* 41* 76*  CREATININE 1.94* 3.34* 4.70* 2.60* 3.76*  CALCIUM 8.3* 8.3* 8.7* 8.3* 8.5*  PHOS 1.8* 3.3 5.2*  --   --     Recent Labs Lab 09/24/16 1140 09/25/16 0400 09/26/16 0718  WBC 16.5* 15.7* 13.7*  HGB 9.4* 9.3* 8.8*  HCT 29.6* 29.7* 27.9*  MCV 88.6 90.5 90.6  PLT 433* 388 393

## 2016-09-26 NOTE — Consult Note (Signed)
History of Present Illness:  Patient is a 60 y.o. year old female who has ESRD and has an IJ catheter placed by Dr. Scot Dock.  The original consult was performed on 08/28/2016 by Dr. Donnetta Hutching.    We have been asked to provide access for HD.  She was admitted secondary to STEMI with cardiogenic shock.   (/20/2017 Post-cath developed respiratory failure and required intubation. Developed shock and renal failure with peak creatinine 3.4 (1.6 on admit). Trialysis catheter placed.   Patient persistently acidotic with concern for ischemic MR and transferred here. En route develop severe hypotension and norepinephrine started.   Brought to cath lab 9/15 and TEE showed normal LV (EF 60-65%) and normal RV function with ruptured posterior papillary muscle with severe posterior MR. Underwent placement of Swan and IABP.   S/P MVR 08/23/16. IABP pulled 9/22.        Past Medical History:  Diagnosis Date  . Diabetes mellitus   . Hyperlipidemia   . Hypertension     Past Surgical History:  Procedure Laterality Date  . CARDIAC CATHETERIZATION N/A 08/15/2016   Procedure: Left Heart Cath and Coronary Angiography;  Surgeon: Yolonda Kida, MD;  Location: [place] [facility];  Service: Cardiovascular;  Laterality: N/A;  . CARDIAC CATHETERIZATION N/A 08/15/2016   Procedure: Coronary Stent Intervention;  Surgeon: Yolonda Kida, MD;  Location: [place] [facility];  Service: Cardiovascular;  Laterality: N/A;  . CARDIAC CATHETERIZATION N/A 08/18/2016   Procedure: Right Heart Cath;  Surgeon: Jolaine Artist, MD;  Location: [place] [facility];  Service: Cardiovascular;  Laterality: N/A;  . CARDIAC CATHETERIZATION N/A 08/18/2016   Procedure: IABP Insertion;  Surgeon: Jolaine Artist, MD;  Location: [place] [facility];  Service: Cardiovascular;  Laterality: N/A;  . CARDIAC CATHETERIZATION Right 08/23/2016   Procedure: CENTRAL LINE INSERTION RIGHT SUBCLAVIAN;  Surgeon: Ivin Poot, MD;  Location:  [place];  Service: Open Heart Surgery;  Laterality: Right;  . ENDOVEIN HARVEST OF GREATER SAPHENOUS VEIN Left 08/23/2016   Procedure: ENDOVEIN HARVEST OF GREATER SAPHENOUS VEIN;  Surgeon: Ivin Poot, MD;  Location: [place];  Service: Open Heart Surgery;  Laterality: Left;  . INSERTION OF DIALYSIS CATHETER Bilateral 08/31/2016   Procedure: INSERTION OF DIALYSIS CATHETER LEFT INTERNAL JUGULAR VEIN & INSERTION OF TRIPLE LUMEN RIGHT INTERNAL JUGULAR VEIN;  Surgeon: Angelia Mould, MD;  Location: [place];  Service: Vascular;  Laterality: Bilateral;  . INTRAOPERATIVE TRANSESOPHAGEAL ECHOCARDIOGRAM N/A 08/23/2016   Procedure: INTRAOPERATIVE TRANSESOPHAGEAL ECHOCARDIOGRAM;  Surgeon: Ivin Poot, MD;  Location: [place];  Service: Open Heart Surgery;  Laterality: N/A;  . MITRAL VALVE REPAIR N/A 08/23/2016   Procedure: MITRAL VALVE REPAIR (MVR) USING 25MM EDWARDS MAGNA EASE BIOPROSTHESIS MITRAL  VALVE;  Surgeon: Ivin Poot, MD;  Location: [place];  Service: Open Heart Surgery;  Laterality: N/A;  . VAGINAL DELIVERY     x 6     Social History Social History  Substance Use Topics  . Smoking status: Never Smoker  . Smokeless tobacco: Never Used  . Alcohol use No    Family History Family History  Problem Relation Age of Onset  . Hypertension Mother   . Diabetes Mother   . Breast cancer Sister     Allergies  No Known Allergies   Current Facility-Administered Medications  Medication Dose Route Frequency Provider Last Rate Last Dose  . 0.9 %  sodium chloride infusion  250 mL Intravenous Continuous Elgie Collard, [name]      .  albuterol (PROVENTIL) (2.5 MG/3ML) 0.083% nebulizer solution 2.5 mg  2.5 mg Nebulization Q2H PRN Chesley Mires, MD      . amiodarone (PACERONE) tablet 200 mg  200 mg Oral BID Amy D Clegg, NP   200 mg at 09/25/16 2205  . atorvastatin (LIPITOR) tablet 20 mg  20 mg Oral q1800 Jolaine Artist, MD   20 mg at 09/25/16 1711  . chlorhexidine (PERIDEX) 0.12 % solution 15 mL   15 mL Mouth Rinse BID Ivin Poot, MD   15 mL at 09/25/16 2200  . clopidogrel (PLAVIX) tablet 75 mg  75 mg Oral Daily Ivin Poot, MD   75 mg at 09/25/16 0948  . Darbepoetin Alfa (ARANESP) injection 100 mcg  100 mcg Subcutaneous Q Mon-1800 Corliss Parish, MD   100 mcg at 09/25/16 1710  . famotidine (PEPCID) 40 MG/5ML suspension 20 mg  20 mg Oral Daily Melburn Popper, RPH   20 mg at 09/25/16 0949  . feeding supplement (NEPRO CARB STEADY) liquid 1,000 mL  1,000 mL Per Tube Q24H Ivin Poot, MD 35 mL/hr at 09/26/16 0400 1,000 mL at 09/26/16 0400  . feeding supplement (PRO-STAT SUGAR FREE 64) liquid 30 mL  30 mL Per Tube TID Ivin Poot, MD   30 mL at 09/25/16 2206  . Gerhardt's butt cream   Topical BID Ivin Poot, MD   1 application at 60/73/71 2200  . glycopyrrolate (ROBINUL) tablet 1 mg  1 mg Per Tube BID Chesley Mires, MD   1 mg at 09/25/16 2206  . guaiFENesin (ROBITUSSIN) 100 MG/5ML solution 600 mg  30 mL Per Tube Q6H Ivin Poot, MD   600 mg at 09/26/16 0610  . HYDROcodone-acetaminophen (HYCET) 7.5-325 mg/15 ml solution 15 mL  15 mL Oral Q4H PRN Ivin Poot, MD   15 mL at 09/24/16 2240  . insulin aspart (novoLOG) injection 0-24 Units  0-24 Units Subcutaneous Q4H Ivin Poot, MD   2 Units at 09/26/16 0402  . insulin detemir (LEVEMIR) injection 10 Units  10 Units Subcutaneous BID Ivin Poot, MD   10 Units at 09/25/16 2203  . MEDLINE mouth rinse  15 mL Mouth Rinse q12n4p Ivin Poot, MD   15 mL at 09/25/16 1200  . midodrine (PROAMATINE) tablet 10 mg  10 mg Oral TID WC Jolaine Artist, MD   10 mg at 09/25/16 1710  . multivitamin liquid 15 mL  15 mL Per Tube Daily Fleet Contras, MD   15 mL at 09/25/16 0949  . ondansetron (ZOFRAN) injection 4 mg  4 mg Intravenous Q6H PRN Elgie Collard, [name]   4 mg at 09/12/16 2253  . sodium chloride flush (NS) 0.9 % injection 3 mL  3 mL Intravenous PRN Tessa N Conte, [name]        ROS:   General:  No weight loss,  Fever, chills  HEENT: No recent headaches, no nasal bleeding, no visual changes, no sore throat, dysphasia   Neurologic: No dizziness, blackouts, seizures. No recent symptoms of stroke or mini- stroke. No recent episodes of slurred speech, or temporary blindness.  Cardiac:  recent episodes of chest pain/pressure, no shortness of breath at rest.  No shortness of breath with exertion.  Denies history of atrial fibrillation or irregular heartbeat  Vascular: No history of rest pain in feet.  No history of claudication.  No history of non-healing ulcer, No history of DVT   Pulmonary: No home oxygen, no productive  cough, no hemoptysis,  No asthma or wheezing, positive trach  Musculoskeletal:  [ ]  Arthritis, [ ]  Low back pain,  [ ]  Joint pain  Hematologic:No history of hypercoagulable state.  No history of easy bleeding.  No history of anemia  Gastrointestinal: No hematochezia or melena,  No gastroesophageal reflux, no trouble swallowing  Urinary: [x ] chronic Kidney disease, [ ]  on HD - [ ]  MWF or [x ] TTHS, [ ]  Burning with urination, [ ]  Frequent urination, [ ]  Difficulty urinating;   Skin: No rashes  Psychological: No history of anxiety,  No history of depression   Physical Examination  Vitals:   09/26/16 0730 09/26/16 0800 09/26/16 0830 09/26/16 0900  BP: (!) 143/80 (!) 153/77 132/80 131/70  Pulse: 76 77 76 78  Resp:      Temp:      TempSrc:      SpO2:      Weight:      Height:        Body mass index is 25.41 kg/m.  General:  Alert and oriented, no acute distress Neck: No bruit or JVD Pulmonary: Clear to auscultation bilaterally Cardiac: Regular Rate and Rhythm without murmur Gastrointestinal: Soft, non-tender, non-distended, no mass, no scars Skin: No rash Extremity Pulses:  2+ radial, brachial pulses bilaterally Musculoskeletal: No deformity, minimal edema B LE  Neurologic: Upper and lower extremity motor grossly 4+/5 and symmetric  DATA: pending vein  mapping   ASSESSMENT:  ESRD on HD via IJ catheter   PLAN: AV fistula verse Graft pending vein mapping.  Her INR will have to be < 2.0 to proceed with surgery.  Will discus plan of care with Dr. Trula Slade.  She is right hand dominant.  Deborah [name], Deborah [name] [facility]   I agree with the above.  Definitive HD access will be made once vein mapping completed.  I discussed with PVT.  We can hold coumadin for fistula creation.  If patient is not a candidate for a fistula and needs a AVGG, will need to consider holding Plavix for procedure.  Annamarie Major

## 2016-09-26 NOTE — Progress Notes (Signed)
Inpatient Rehabilitation  I visited the patient at the bedside while in dialysis.  She was able to provide me with the phone number for her daughter [name].  I spoke with [name] to advise her we are following for potential IP Rehab when pt. medically is ready, can tolerate and pending bed availability.  [name] confirms her intent to care for her mom upon DC.  I also discussed the case with Elenor Quinones, RNCM.  Pt. currently still has a trach and has not been clipped for OP HD at this time.  We will continue to follow along for possible CIR when appropriate.  Please call if questions.  [place] Admissions Coordinator Cell [phone] Office [phone]

## 2016-09-26 NOTE — Progress Notes (Signed)
PT Cancellation Note  Patient Details Name: Deborah [name] MRN: [id number] DOB: Apr 06, 1956   Cancelled Treatment:    Reason Eval/Treat Not Completed: Patient at procedure or test/unavailable (pt in HD)   Lanetta Inch [facility] 09/26/2016, 7:48 AM Elwyn Reach, [place]

## 2016-09-26 NOTE — Progress Notes (Signed)
ANTICOAGULATION CONSULT NOTE - Follow Up Consult  Pharmacy Consult for Coumadin Indication: Aflutter  No Known Allergies  Patient Measurements: Height: 5\' 1"  (154.9 cm) Weight: 130 lb 1.1 oz (59 kg) IBW/kg (Calculated) : 47.8  Vital Signs: Temp: 98.7 F (37.1 C) (10/24 1200) Temp Source: Oral (10/24 1200) BP: 110/60 (10/24 1027) Pulse Rate: 89 (10/24 1229)  Labs:  Recent Labs  09/24/16 0400  09/24/16 1140 09/25/16 0400 09/26/16 0400 09/26/16 0718  HGB  --   < > 9.4* 9.3*  --  8.8*  HCT  --   --  29.6* 29.7*  --  27.9*  PLT  --   --  433* 388  --  393  LABPROT 44.3*  --   --  28.8* 28.2*  --   INR 4.62*  --   --  2.65 2.58  --   CREATININE  --   --  4.70* 2.60* 3.76*  --   < > = values in this interval not displayed.  Estimated Creatinine Clearance: 13.3 mL/min (by C-G formula based on SCr of 3.76 mg/dL (H)).   Assessment: 60yof continues on coumadin for aflutter> in SR after TEE/DCCV post-op. S/p heparin pre-op for IABP (removed 9/23), held post-op and d/c'd. Currently new ESRD, appetite improving per MD note. CBC stable, no bleed documented.  Warfarin held x 2 with INR up to 4.62, now slightly supratherapeutic but improved at 2.58 s/p low dose last night. Per discussion with Dr. Prescott Gum, hold warfarin tonight with possible Vascular procedure for HD access pending - per note, need INR<2.  Goal of Therapy:  INR 2-2.5 per Dr. Prescott Gum Monitor platelets by anticoagulation protocol: Yes   Plan:  -Hold warfarin tonight per discussion with Dr. Prescott Gum -Daily INR -Monitor for s/sx bleeding -F/u Vascular plans  Elicia Lamp, PharmD, BCPS Clinical Pharmacist 09/26/2016 1:21 PM

## 2016-09-27 ENCOUNTER — Inpatient Hospital Stay (HOSPITAL_COMMUNITY): Payer: Medicaid Other

## 2016-09-27 DIAGNOSIS — Z01810 Encounter for preprocedural cardiovascular examination: Secondary | ICD-10-CM

## 2016-09-27 LAB — GLUCOSE, CAPILLARY
GLUCOSE-CAPILLARY: 253 mg/dL — AB (ref 65–99)
GLUCOSE-CAPILLARY: 149 mg/dL — AB (ref 65–99)
Glucose-Capillary: 127 mg/dL — ABNORMAL HIGH (ref 65–99)
Glucose-Capillary: 152 mg/dL — ABNORMAL HIGH (ref 65–99)
Glucose-Capillary: 229 mg/dL — ABNORMAL HIGH (ref 65–99)
Glucose-Capillary: 92 mg/dL (ref 65–99)

## 2016-09-27 LAB — BASIC METABOLIC PANEL
ANION GAP: 12 (ref 5–15)
BUN: 40 mg/dL — ABNORMAL HIGH (ref 6–20)
CHLORIDE: 98 mmol/L — AB (ref 101–111)
CO2: 27 mmol/L (ref 22–32)
Calcium: 9 mg/dL (ref 8.9–10.3)
Creatinine, Ser: 2.53 mg/dL — ABNORMAL HIGH (ref 0.44–1.00)
GFR calc non Af Amer: 20 mL/min — ABNORMAL LOW (ref >60)
GFR, EST AFRICAN AMERICAN: 23 mL/min — AB (ref >60)
Glucose, Bld: 130 mg/dL — ABNORMAL HIGH (ref 65–99)
Potassium: 3.6 mmol/L (ref 3.5–5.1)
Sodium: 137 mmol/L (ref 135–145)

## 2016-09-27 LAB — PROTIME-INR
INR: 1.73
Prothrombin Time: 20.4 seconds — ABNORMAL HIGH (ref 11.4–15.2)

## 2016-09-27 MED ORDER — INSULIN DETEMIR 100 UNIT/ML ~~LOC~~ SOLN
5.0000 [IU] | Freq: Two times a day (BID) | SUBCUTANEOUS | Status: DC
  Administered 2016-09-27 – 2016-09-29 (×5): 5 [IU] via SUBCUTANEOUS
  Filled 2016-09-27 (×6): qty 0.05

## 2016-09-27 MED ORDER — NEPRO/CARBSTEADY PO LIQD
1000.0000 mL | ORAL | Status: AC
  Filled 2016-09-27: qty 1000

## 2016-09-27 NOTE — Progress Notes (Signed)
Pt. Has undergone vein mapping for permanent HD access.  PCCM is considering decannulation today.  Will continue to follow for timing of medical readiness, activity tolerance, and bed availability for possible IP Rehab admission.  Per nephrology, no OP HD until trach removed.  Pt. has not been clipped.  I discussed with Elenor Quinones, RNCM by phone.  Please call if questions.  [place] Admissions Coordinator Cell [phone] Office [phone]

## 2016-09-27 NOTE — Progress Notes (Signed)
Trach removed per MD order. Pt tol well. Site very clean, no distress noted. Dry dressing applied to stoma. Pt placed on 2 L [place] for transition. Will cont to monitor

## 2016-09-27 NOTE — Progress Notes (Signed)
Occupational Therapy Treatment Patient Details Name: Deborah [name] MRN: [id number] DOB: [birth year] Today's Date: 09/27/2016    History of present illness 60 yo admitted to [facility] 9/12 with STEMI develped cardiogenic shock with ETT and cRRT initiated on 9/14 after cath, transfer to [facility] 9/47, metabolic acidosis, underwent MVR 9/20, extubated 9/29, reintubated 9/30, 9/25-9/29 hemiplegia developed and resolved, trach and bronchoscopy 10/5. PMHx: DM, HTN, HLD   OT comments  Pt demonstrates improved activity tolerance!.  She is now able to begin to engage in self care activities.  Cognition improving.   Follow Up Recommendations   CIR    Equipment Recommendations  3 in 1 bedside comode    Recommendations for Other Services      Precautions / Restrictions Precautions Precautions: Sternal;Fall Precaution Comments: flexiseal, panda       Mobility Bed Mobility Overal bed mobility: Needs Assistance Bed Mobility: Sit to Sidelying         Sit to sidelying: Min assist General bed mobility comments: Pt requires min A to guide trunk to bed and to lift feet   Transfers Overall transfer level: Needs assistance Equipment used: Rolling walker (2 wheeled) Transfers: Sit to/from [facility] Sit to Stand: Mod assist;+2 physical assistance;Min assist Stand pivot transfers: Min assist;+2 physical assistance       General transfer comment: Pt required mod A to move into standing due to fatigue     Balance Overall balance assessment: Needs assistance Sitting-balance support: Feet supported Sitting balance-Leahy Scale: Fair     Standing balance support: Bilateral upper extremity supported Standing balance-Leahy Scale: Poor                     ADL Overall ADL's : Needs assistance/impaired                     Lower Body Dressing: Maximal assistance;Sit to/from stand Lower Body Dressing Details (indicate cue type and reason): Pt required min A to don/doff  Lt sock.  Total A for Rt  Toilet Transfer: Moderate assistance;+2 for safety/equipment;BSC;RW Toilet Transfer Details (indicate cue type and reason): Mod A to move into standing when fatigued          Functional mobility during ADLs: Minimal assistance;Moderate assistance;+2 for safety/equipment;Rolling walker General ADL Comments: Pt able to access Lt foot for LB ADLs       Vision                     Perception     Praxis      Cognition   Behavior During Therapy: Flat affect Overall Cognitive Status: Impaired/Different from baseline Area of Impairment: Attention;Safety/judgement;Problem solving   Current Attention Level: Selective Memory: Decreased recall of precautions  Following Commands: Follows one step commands consistently Safety/Judgement: Decreased awareness of safety   Problem Solving: Slow processing;Difficulty sequencing      Extremity/Trunk Assessment               Exercises General Exercises - Upper Extremity Shoulder Flexion: AROM;Right;Left;20 reps;Seated Shoulder ABduction: AROM;Right;Left;10 reps;Seated Elbow Flexion: AROM;Right;Left;10 reps;Seated Elbow Extension: AROM;Right;Left;10 reps;Seated General Exercises - Lower Extremity Long Arc Quad: AROM;Left;Both;15 reps;Seated Hip Flexion/Marching: AAROM;Both;Seated;15 reps   Shoulder Instructions       General Comments      Pertinent Vitals/ Pain       Pain Assessment: Faces Faces Pain Scale: No hurt  Home Living  Prior Functioning/Environment              Frequency  Min 3X/week        Progress Toward Goals  OT Goals(current goals can now be found in the care plan section)  Progress towards OT goals: Progressing toward goals  ADL Goals Pt Will Perform Grooming: with supervision;sitting Pt Will Perform Upper Body Bathing: with supervision;sitting Pt Will Perform Lower Body Bathing: with mod assist;sit  to/from stand Pt Will Transfer to Toilet: with min assist;stand pivot transfer;bedside commode Pt Will Perform Toileting - Clothing Manipulation and hygiene: with min assist;sit to/from stand Pt/caregiver will Perform Home Exercise Program: Right Upper extremity;Increased strength;Left upper extremity;With Supervision;With written HEP provided  Plan Discharge plan remains appropriate    Co-evaluation                 End of Session Equipment Utilized During Treatment: Oxygen;Rolling walker;Gait belt   Activity Tolerance Patient tolerated treatment well   Patient Left in bed;with call bell/phone within reach   Nurse Communication Mobility status        Time: 1458-1531 OT Time Calculation (min): 33 min  Charges: OT General Charges $OT Visit: 1 Procedure OT Treatments $Therapeutic Activity: 8-22 mins $Therapeutic Exercise: 8-22 mins  Beatryce Colombo M 09/27/2016, 10:40 PM

## 2016-09-27 NOTE — Progress Notes (Signed)
Patient ID: Deborah [name], female   DOB: 1956-05-31, 60 y.o.   MRN: [id number] EVENING ROUNDS NOTE :     [address]       [place] [address]             [phone]                 27 Days Post-Op Procedure(s) (LRB): INSERTION OF DIALYSIS CATHETER LEFT INTERNAL JUGULAR VEIN & INSERTION OF TRIPLE LUMEN RIGHT INTERNAL JUGULAR VEIN (Bilateral)  Total Length of Stay:  LOS: 40 days  BP 136/70   Pulse 85   Temp 98.5 F (36.9 C) (Oral)   Resp 15   Ht 5\' 1"  (1.549 m)   Wt 126 lb 12.2 oz (57.5 kg)   SpO2 100%   BMI 23.95 kg/m   .Intake/Output      10/25 0701 - 10/26 0700   P.O. 180   Other    NG/GT    Total Intake(mL/kg) 180 (3.1)   Urine (mL/kg/hr)    Emesis/NG output    Other    Stool 100 (0.1)   Total Output 100   Net +80         . sodium chloride       Lab Results  Component Value Date   WBC 13.7 (H) 09/26/2016   HGB 8.8 (L) 09/26/2016   HCT 27.9 (L) 09/26/2016   PLT 393 09/26/2016   GLUCOSE 130 (H) 09/27/2016   CHOL 207 (H) 07/09/2014   TRIG 174.0 (H) 07/09/2014   HDL 40.60 07/09/2014   LDLCALC 132 (H) 07/09/2014   ALT 36 09/25/2016   AST 33 09/25/2016   NA 137 09/27/2016   K 3.6 09/27/2016   CL 98 (L) 09/27/2016   CREATININE 2.53 (H) 09/27/2016   BUN 40 (H) 09/27/2016   CO2 27 09/27/2016   TSH 1.485 Test methodology is 3rd generation TSH 01/26/2010   INR 1.73 09/27/2016   HGBA1C 13.3 (H) 08/15/2016   MICROALBUR 15.8 (H) 07/09/2014   Awake and alert, walking in hall some   Grace Isaac MD  Beeper [phone] Office [phone] 09/27/2016 9:08 PM

## 2016-09-27 NOTE — PMR Pre-admission (Signed)
PMR Admission Coordinator Pre-Admission Assessment  Patient: Deborah [name] is an 60 y.o., female MRN: [id number] DOB: 03-Jun-1956 Height: 5\' 1"  (154.9 cm) Weight: 58.1 kg (128 lb 1.4 oz)              Insurance Information HMO:     PPO:      PCP:      IPA:      80/20:      OTHER:  PRIMARY: Uninsured      Policy#:       Subscriber:  CM Name:       Phone#:      Fax#:  Pre-Cert#:       Employer:  Benefits:  Phone #:      Name:  Eff. Date:      Deduct:       Out of Pocket Max:       Life Max:  CIR:       SNF:  Outpatient:      Co-Pay:  Home Health:       Co-Pay:  DME:      Co-Pay:  Providers:  SECONDARY:       Policy#:       Subscriber:  CM Name:       Phone#:      Fax#:  Pre-Cert#:       Employer:  Benefits:  Phone #:      Name:  Eff. Date:      Deduct:       Out of Pocket Max:       Life Max:  CIR:       SNF:  Outpatient:      Co-Pay:  Home Health:       Co-Pay:  DME:      Co-Pay:   Medicaid Application Date: 6/44/03      Case Manager:  Disability Application Date:       Case Worker:   Emergency Contact Information Contact Information    Name Relation Home Work Mobile   Speakman,TERESA  [phone]     Ruffner,Sarah Daughter  [phone]    [name], [name] [phone]       Current Medical History  Patient Admitting Diagnosis: Debility due to multiple medical issues History of Present Illness: Deborah [name] a 60 y.o.femalewith history of T2DM, HTN, CKD stage III with medical non-compliance who was originally admitted to [facility] 08/15/16 with NSTEMI and underwent cardiac cath with stenting. History taken from chart review. She developed acute renal failure with metabolic acidosis and oliguria requiring CRRT. She went on to develop cardiogenic shock requiring intubation and was transferred to [facility] on 08/18/16 for treatment. 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 She underwent mitral valve repair by Prescott Gum on 9/21. Right  IJ placed on 9/28 and HD initiated. She was extubated on 9/29 but reintubated due to septic shock due to staph bacteremia and required pressors for support. A flutter treated with TEE DCCV on 10/2. On  Coumadin and amiodarone for sinus tach with PAF. She underwent tracheostomy on 10/5 and was weaned to ATC and tolerating PMSV for short periods of time. Has completed treatment for enterobacter HCAP and was decannualted on 10/25.     MBS done 10/23 showing moderate pharyngeal dysphagia--she was started on dysphagia 2, nectar liquids with chin tuck to prevent penetration.  LUE AV graft and tunneled diatek catheter placed on 10/30 as patient HD dependent. Has been waned off milrinone and  is tolerating HD session with midodrine on board. She continues to have frequent productive cough as well as paroxysmal episodes with activity and follow up CXR 11/1 showed mild pulmonary vascular congestion with edema. She was started on cipro for bronchitis treatment.  Repeat MBS done 11/01 with recommendations to continue current diet. Panda tube removed on 11/5 due to ongoing coughing episodes and concerns of aspiration.   Therapy ongoing and patient with weakness with balance and proprioceptive deficits, scissoring gait as well as dysphagia. CIR was recommended for follow up therapy.       Past Medical History  Past Medical History:  Diagnosis Date  . Diabetes mellitus   . Hyperlipidemia   . Hypertension     Family History  family history includes Breast cancer in her sister; Diabetes in her mother; Hypertension in her mother.  Prior Rehab/Hospitalizations:  Has the patient had major surgery during 100 days prior to admission? No  Current Medications   Current Facility-Administered Medications:  .  0.9 %  sodium chloride infusion, 250 mL, Intravenous, Continuous, Elgie Collard, PA-C, Stopped at 10/02/16 1518 .  albuterol (PROVENTIL) (2.5 MG/3ML) 0.083% nebulizer solution 2.5 mg, 2.5 mg, Nebulization,  Q2H PRN, Chesley Mires, MD, 2.5 mg at 10/07/16 0223 .  amiodarone (PACERONE) tablet 200 mg, 200 mg, Oral, BID, Amy D Clegg, NP, 200 mg at 10/10/16 2209 .  atorvastatin (LIPITOR) tablet 20 mg, 20 mg, Oral, q1800, Jolaine Artist, MD, 20 mg at 10/10/16 1655 .  chlorhexidine (PERIDEX) 0.12 % solution 15 mL, 15 mL, Mouth Rinse, BID, Ivin Poot, MD, 15 mL at 10/10/16 2209 .  chlorpheniramine-HYDROcodone (TUSSIONEX) 10-8 MG/5ML suspension 5 mL, 5 mL, Oral, QHS PRN, Ivin Poot, MD, 5 mL at 10/09/16 2042 .  clopidogrel (PLAVIX) tablet 75 mg, 75 mg, Oral, Daily, Janalyn Harder Trinh, PA-C, 75 mg at 10/10/16 1126 .  Darbepoetin Alfa (ARANESP) injection 100 mcg, 100 mcg, Intravenous, Q Tue-HD, Ivin Poot, MD, 100 mcg at 10/03/16 1451 .  diphenhydrAMINE (BENADRYL) 12.5 MG/5ML elixir 12.5 mg, 12.5 mg, Oral, QHS PRN, Elgie Collard, PA-C, 12.5 mg at 10/10/16 0013 .  diphenhydrAMINE (BENADRYL) 12.5 MG/5ML liquid 12.5 mg, 12.5 mg, Oral, Daily PRN, Elgie Collard, PA-C, 12.5 mg at 10/07/16 1829 .  famotidine (PEPCID) 40 MG/5ML suspension 20 mg, 20 mg, Oral, Daily, Melburn Popper, RPH, 20 mg at 10/10/16 1127 .  [START ON 10/12/2016] feeding supplement (NEPRO CARB STEADY) liquid 237 mL, 237 mL, Oral, Daily, Rogue Bussing, RD .  Derrill Memo ON 10/12/2016] ferric gluconate (NULECIT) 250 mg in sodium chloride 0.9 % 100 mL IVPB, 250 mg, Intravenous, Q T,Th,Sa-HD, Jamal Maes, MD .  Gerhardt's butt cream, , Topical, BID, Ivin Poot, MD .  glycopyrrolate (ROBINUL) tablet 1 mg, 1 mg, Per Tube, BID, Chesley Mires, MD, 1 mg at 10/10/16 2209 .  guaiFENesin (ROBITUSSIN) 100 MG/5ML solution 600 mg, 30 mL, Per Tube, Q6H, Ivin Poot, MD, 600 mg at 10/11/16 [phone]  HYDROcodone-acetaminophen (HYCET) 7.5-325 mg/15 ml solution 15 mL, 15 mL, Oral, Q4H PRN, Ivin Poot, MD, 15 mL at 10/06/16 0253 .  hydrocortisone (ANUSOL-HC) 2.5 % rectal cream, , Rectal, BID, Ivin Poot, MD .  CBG monitoring, , , Q4H  **AND** insulin aspart (novoLOG) injection 0-24 Units, 0-24 Units, Subcutaneous, Q4H, Ivin Poot, MD, 12 Units at 10/10/16 2210 .  insulin detemir (LEVEMIR) injection 10 Units, 10 Units, Subcutaneous, BID, Ivin Poot, MD, 10 Units at 10/10/16  2210 .  lanthanum (FOSRENOL) chewable tablet 1,000 mg, 1,000 mg, Oral, TID WC, Jamal Maes, MD, 1,000 mg at 10/11/16 [phone] .  MEDLINE mouth rinse, 15 mL, Mouth Rinse, q12n4p, Ivin Poot, MD, 15 mL at 10/09/16 1200 .  midodrine (PROAMATINE) tablet 10 mg, 10 mg, Oral, q morning - 10a, Ivin Poot, MD, 10 mg at 10/10/16 1126 .  multivitamin (RENA-VIT) tablet 1 tablet, 1 tablet, Oral, QHS, Elmarie Shiley, MD, 1 tablet at 10/10/16 2209 .  multivitamin liquid 15 mL, 15 mL, Per Tube, Daily, Fleet Contras, MD, 15 mL at 10/10/16 1124 .  ondansetron (ZOFRAN) injection 4 mg, 4 mg, Intravenous, Q6H PRN, Elgie Collard, PA-C, 4 mg at 10/10/16 0006 .  potassium chloride SA (K-DUR,KLOR-CON) CR tablet 20 mEq, 20 mEq, Oral, Once, Jamal Maes, MD .  RESOURCE THICKENUP CLEAR, , Oral, PRN, Ivin Poot, MD .  sodium chloride flush (NS) 0.9 % injection 3 mL, 3 mL, Intravenous, PRN, Elgie Collard, PA-C, 3 mL at 10/06/16 0848 .  [START ON 10/12/2016] warfarin (COUMADIN) tablet 2.5 mg, 2.5 mg, Oral, q1800, Elgie Collard, PA-C .  Warfarin - Physician Dosing Inpatient, , Does not apply, q1800, Ivin Poot, MD  Patients Current Diet: DIET DYS 2 Room service appropriate? Yes; Fluid consistency: Nectar Thick  Precautions / Restrictions Precautions Precautions: Sternal, Fall Precaution Comments: panda Restrictions Weight Bearing Restrictions: Yes (sternal precautions)   Has the patient had 2 or more falls or a fall with injury in the past year?No  Prior Activity Level Community (5-7x/wk): Pt. did not drive PTA.  Keeps grandchildren at times and would walk to the park.  Pt. would go out several times per week with one of her children.  Home Assistive  Devices / Equipment Home Assistive Devices/Equipment: None  Prior Device Use: Indicate devices/aids used by the patient prior to current illness, exacerbation or injury? None of the above  Prior Functional Level Prior Function Level of Independence: Independent  Self Care: Did the patient need help bathing, dressing, using the toilet or eating?  Independent  Indoor Mobility: Did the patient need assistance with walking from room to room (with or without device)? Independent  Stairs: Did the patient need assistance with internal or external stairs (with or without device)? Independent  Functional Cognition: Did the patient need help planning regular tasks such as shopping or remembering to take medications? Independent  Current Functional Level Cognition  Overall Cognitive Status: Impaired/Different from baseline Difficult to assess due to: Tracheostomy (with PMSV 50% of session) Current Attention Level: Selective Orientation Level: Oriented X4 Following Commands: Follows one step commands consistently Safety/Judgement: Decreased awareness of safety General Comments: Pt demonstrates poor safety awareness. unable to recall any precautions before during or after session despite education    Extremity Assessment (includes Sensation/Coordination)  Upper Extremity Assessment: RUE deficits/detail, LUE deficits/detail RUE Deficits / Details: Rt UE grossly 3+/5 LUE Deficits / Details: Grossly 3-/5  Lower Extremity Assessment: Defer to PT evaluation RLE Deficits / Details: grossly 2+/5 with decreased dorsiflexion bil LE LLE Deficits / Details: grossly 2+/5 with decreased dorsiflexion bil LE    ADLs  Overall ADL's : Needs assistance/impaired Eating/Feeding: NPO Grooming: Wash/dry hands, Wash/dry face, Minimal assistance, Standing Upper Body Bathing: Maximal assistance, Sitting Lower Body Bathing: Total assistance, Sit to/from stand Upper Body Dressing : Total assistance, Sitting Lower  Body Dressing: Moderate assistance, Sit to/from stand Lower Body Dressing Details (indicate cue type and reason): Pt required min A to don/doff Lt  sock.  Total A for Rt  Toilet Transfer: Moderate assistance, Ambulation, Comfort height toilet, BSC Toilet Transfer Details (indicate cue type and reason): Mod A to move into standing when fatigued  Toileting- Clothing Manipulation and Hygiene: Maximal assistance, Sit to/from stand Functional mobility during ADLs: Moderate assistance, Rolling walker General ADL Comments: Pt requires assist for balance and thoroughness     Mobility  Overal bed mobility: Needs Assistance Bed Mobility: Supine to Sit, Sit to Supine Rolling: Min assist Sidelying to sit: Supervision, HOB elevated Supine to sit: Min guard Sit to supine: Min guard Sit to sidelying: Min assist General bed mobility comments: cues for sequence to roll to side and elevate trunk, min assist for trunk elevation, HOB 20 degrees    Transfers  Overall transfer level: Needs assistance Equipment used: 1 person hand held assist Transfers: Sit to/from Stand, Stand Pivot Transfers Sit to Stand: Min assist Stand pivot transfers: Min assist General transfer comment: cues for hand placement and balance     Ambulation / Gait / Stairs / Wheelchair Mobility  Ambulation/Gait Ambulation/Gait assistance: Mod assist, +2 safety/equipment Ambulation Distance (Feet): 45 Feet Assistive device: Rolling walker (2 wheeled) Gait Pattern/deviations: Step-through pattern, Decreased stride length, Scissoring General Gait Details: pt with variation in being too close and too far posterior in RW with scissoring and left lean at times. Assist for balance, control of RW and cues for safety and sequence. Chair with close follow as pt fatigues and sits with limted warning. Pt walked 45' x 2 trials  Gait velocity interpretation: Below normal speed for age/gender    Posture / Balance Dynamic Sitting Balance Sitting  balance - Comments: close min guard assist  Balance Overall balance assessment: Needs assistance Sitting-balance support: Feet supported Sitting balance-Leahy Scale: Fair Sitting balance - Comments: close min guard assist  Postural control: Posterior lean, Left lateral lean Standing balance support: During functional activity, Single extremity supported Standing balance-Leahy Scale: Poor Standing balance comment: requires min A     Special needs/care consideration BiPAP/CPAP   no CPM   no Continuous Drip IV   Dialysis new HD         Days TuThSat Life Vest   no Oxygen   room air Special Bed no Trach Size   decannulated 09/27/16 Wound Vac (area)  no       Skin  Per nursing assessment, MASD left buttocks; ecchymosis abdomen and arms; left upper proximal leg excoraition             Bowel mgmt: 10/09/16, incontinent Bladder mgmt:  Intermittent incontinence  Diabetic mgmt   yes     Previous Home Environment Living Arrangements: Children (lived with son Ailany Koren PTA) Available Help at Discharge: Available 24 hours/day, Other (Comment) (pt. to stay with daughter [name] who will work from home) Type of Home: [place]: No Additional Comments: pt on vent and trach and unable to provide all PLOF and home environment at this time.   Discharge Living Setting Plans for Discharge Living Setting: Other (Comment) (daughter Kierre Deines home)  [name] states she is currently in the process of obtaining a rental house and hopes to be in the new location by 10/13/16.   Type of Home at Discharge: Apartment Discharge Home Layout: One level Discharge Home Access: Level entry Discharge Bathroom Shower/Tub: Tub/shower unit Discharge Bathroom Toilet: Standard Discharge Bathroom Accessibility: Yes How Accessible: Accessible via wheelchair, Accessible via walker Does the patient have any problems obtaining your medications?: Yes (Describe) (pt. is  uninsured)  Social/Family/Support  Systems Patient Roles: Parent Anticipated Caregiver: Channah Godeaux, daughter to be primary caregiver.  Per [name], she currently works out of the home but has 2 job offers to work from her home.  She plans to accept one of these jobs so she can care for her mom.  Joyann Spidle, daughter works full time days and will help in the evenings Anticipated Caregiver's Contact Information: Maty Zeisler, [phone]; Myrissa Chipley, [phone] Ability/Limitations of Caregiver: [name] currently works 3rd shift but states she plans to work from home to care for her mom.   Caregiver Availability: 24/7 Discharge Plan Discussed with Primary Caregiver: Yes (Discussed at length with [name] and Douglass Rivers) Is Caregiver In Agreement with Plan?: Yes Does Caregiver/Family have Issues with Lodging/Transportation while Pt is in Rehab?: No  Goals/Additional Needs Patient/Family Goal for Rehab: supervision and min assist PT/OT; modified independent and supervision SLP Expected length of stay: 18-21 days Cultural Considerations: n/a Dietary Needs: dysphagia 2, nectar thick Equipment Needs: TBA Special Service Needs: Pt. is a new start to HD, now clipped Pt/Family Agrees to Admission and willing to participate: Yes Program Orientation Provided & Reviewed with Pt/Caregiver Including Roles  & Responsibilities: Yes  Decrease burden of Care through IP rehab admission: n/a  Possible need for SNF placement upon discharge:   Not anticipated  Patient Condition: This patient's medical and functional status has changed since the consult dated 09/20/16 in which the Rehabilitation Physician determined and documented that the patient was potentially appropriate for intensive rehabilitative care in an inpatient rehabilitation facility. Issues have been addressed and update has been discussed with Dr.Patel  and patient now appropriate for inpatient rehabilitation. Will admit to inpatient rehab today.    Preadmission Screen Completed  By:  Gerlean Ren, 10/11/2016 11:24 AM ______________________________________________________________________   Discussed status with Dr.Patel   On 10/11/16 at 45  and received telephone approval for admission today.  Admission Coordinator:  Gerlean Ren, time  1123 Sudie Grumbling 10/11/16

## 2016-09-27 NOTE — Progress Notes (Signed)
27 Days Post-Op Procedure(s) (LRB): INSERTION OF DIALYSIS CATHETER LEFT INTERNAL JUGULAR VEIN & INSERTION OF TRIPLE LUMEN RIGHT INTERNAL JUGULAR VEIN (Bilateral) Subjective: Continued improvement  Tracheostomy removed Patient being prepared for permanent-HD access procedure Patient walked over 40 feet with physical therapy Inpatient rehabilitation  CIR is following Maintaining sinus rhythm  Objective: Vital signs in last 24 hours: Temp:  [97.8 F (36.6 C)-99.7 F (37.6 C)] 98.6 F (37 C) (10/25 1724) Pulse Rate:  [81-92] 84 (10/25 1742) Cardiac Rhythm: Normal sinus rhythm (10/25 0800) Resp:  [14-24] 15 (10/25 1742) BP: (84-147)/(48-103) 128/72 (10/25 1600) SpO2:  [95 %-100 %] 98 % (10/25 1742) FiO2 (%):  [28 %] 28 % (10/25 0818) Weight:  [126 lb 12.2 oz (57.5 kg)] 126 lb 12.2 oz (57.5 kg) (10/25 0428)  Hemodynamic parameters for last 24 hours:   Stable Intake/Output from previous day: 10/24 0701 - 10/25 0700 In: 980 [NG/GT:880] Out: 2500 [Stool:500] Intake/Output this shift: Total I/O In: 180 [P.O.:180] Out: -        Exam    General- alert and comfortable   Lungs- clear without rales, wheezes   Cor- regular rate and rhythm, no murmur , gallop   Abdomen- soft, non-tender   Extremities - warm, non-tender, minimal edema   Neuro- oriented, appropriate, no focal weakness   Lab Results:  Recent Labs  09/25/16 0400 09/26/16 0718  WBC 15.7* 13.7*  HGB 9.3* 8.8*  HCT 29.7* 27.9*  PLT 388 393   BMET:  Recent Labs  09/26/16 0400 09/27/16 0232  NA 131* 137  K 3.6 3.6  CL 93* 98*  CO2 27 27  GLUCOSE 145* 130*  BUN 76* 40*  CREATININE 3.76* 2.53*  CALCIUM 8.5* 9.0    PT/INR:  Recent Labs  09/27/16 0232  LABPROT 20.4*  INR 1.73   ABG    Component Value Date/Time   PHART 7.541 (H) 09/08/2016 0412   HCO3 25.2 09/08/2016 0412   TCO2 26 09/08/2016 0412   ACIDBASEDEF 4.1 (H) 09/02/2016 1850   O2SAT 85.8 09/22/2016 0503   CBG (last 3)   Recent  Labs  09/27/16 0835 09/27/16 1303 09/27/16 1721  GLUCAP 152* 127* 253*    Assessment/Plan: S/P Procedure(s) (LRB): INSERTION OF DIALYSIS CATHETER LEFT INTERNAL JUGULAR VEIN & INSERTION OF TRIPLE LUMEN RIGHT INTERNAL JUGULAR VEIN (Bilateral) Continue current care Hopefully patient will be candidate for inpatient rehabilitation CIR   LOS: 40 days    Tharon Aquas Trigt III 09/27/2016

## 2016-09-27 NOTE — Progress Notes (Signed)
PULMONARY / CRITICAL CARE MEDICINE   Name: Deborah [name] MRN: [id number] DOB: [birth year]    ADMISSION DATE:  08/18/2016 CONSULTATION DATE:  08/18/2016  REFERRING MD: Bensimhon  CHIEF COMPLAINT: Chest pain  SUBJECTIVE:  In bed TC, speaking even without valve  VITAL SIGNS: BP 118/67   Pulse 83   Temp 98.5 F (36.9 C) (Oral)   Resp 17   Ht 5\' 1"  (1.549 m)   Wt 57.5 kg (126 lb 12.2 oz)   SpO2 100%   BMI 23.95 kg/m   INTAKE / OUTPUT: I/O last 3 completed shifts: In: 6295 [I.V.:20; Other:100; NG/GT:1545] Out: 2500 [Other:2000; Stool:500]  PHYSICAL EXAMINATION: General:  Frail female in NAD Neuro:  Normal strength ext HEENT:  Trach site clean Cardiovascular:  s1 s2 RRR no  Lungs:  No wheeze, good air entry to bases, no PMV on now Abdomen:  Soft, non tender Musculoskeletal:  1+ edema Skin:  No rashes  LABS: CMP Latest Ref Rng & Units 09/27/2016 09/26/2016 09/25/2016  Glucose 65 - 99 mg/dL 130(H) 145(H) 89  BUN 6 - 20 mg/dL 40(H) 76(H) 41(H)  Creatinine 0.44 - 1.00 mg/dL 2.53(H) 3.76(H) 2.60(H)  Sodium 135 - 145 mmol/L 137 131(L) 135  Potassium 3.5 - 5.1 mmol/L 3.6 3.6 3.4(L)  Chloride 101 - 111 mmol/L 98(L) 93(L) 96(L)  CO2 22 - 32 mmol/L 27 27 28   Calcium 8.9 - 10.3 mg/dL 9.0 8.5(L) 8.3(L)  Total Protein 6.5 - 8.1 g/dL - - 6.5  Total Bilirubin 0.3 - 1.2 mg/dL - - 0.8  Alkaline Phos 38 - 126 U/L - - 111  AST 15 - 41 U/L - - 33  ALT 14 - 54 U/L - - 36    CBC Latest Ref Rng & Units 09/26/2016 09/25/2016 09/24/2016  WBC 4.0 - 10.5 K/uL 13.7(H) 15.7(H) 16.5(H)  Hemoglobin 12.0 - 15.0 g/dL 8.8(L) 9.3(L) 9.4(L)  Hematocrit 36.0 - 46.0 % 27.9(L) 29.7(L) 29.6(L)  Platelets 150 - 400 K/uL 393 388 433(H)    Glucose  Recent Labs Lab 09/26/16 1148 09/26/16 1607 09/26/16 1939 09/27/16 0004 09/27/16 0347 09/27/16 0835  GLUCAP 188* 301* 154* 229* 92 152*    Imaging No results found.  STUDIES:  LHC 9/12 >> PCI to OM1, severe MR, EF 55 to 60% RHC 9/15 >> RA 5,  RV 37/3/6, PA 38/18/26, PCWP 15, CI 1.9, PVR 3.3 WU TEE 9/15 >> EF 60 to 65%, flail motion MV with severe MR TTE 10/15 >> EF 60 to 65%, mild LVH  CULTURES: Sputum 9/15 >> Group B Strep, Staph aureus Blood 9/24 >> coag negative Staph Sputum 10/01 >> Enterobacter Sputum 10/12 >> Few Candida albicans  SIGNIFICANT EVENTS: 09/12 Admit to [facility] 09/15 To [facility] 09/20 MVR with bioprosthetic valve 09/28 Permcath for HD 09/29 Extubated 09/30 Reintubated 10/05 Trach by [facility] 10/09 Off vent 10/13 Changed to #4 cuffless trach  DISCUSSION: 60 yo female from [facility] 9/12 with STEMI s/p cath to [place].  Developed cardiogenic shock, CAP with Group B Strep, AKI, HCAP with Enterobacter.  Failed to wean and required trach.  ASSESSMENT / PLAN:  Acute hypoxic respiratory failure 2nd to CAP, STEMI. Failure to wean s/p tracheostomy. CAP with Group B strep >> completed Abx 9/23. HCAP with Enterobacter >> completed Abx 10/14. - trach collar 24/7 - she used PMV daytime -she speaks without valve also -she ambulates as well -she can cough mucous through mouth with PMV on -also eats -would consider decannulation today  Lavon Paganini. Titus Mould, MD, [place] Pgr:  [place] Titus Mould, MD, [place] Pgr: [facility] 09/27/2016 10:20 AM

## 2016-09-27 NOTE — Progress Notes (Signed)
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 amiodarone  200 mg Oral BID  . atorvastatin  20 mg Oral q1800  . chlorhexidine  15 mL Mouth Rinse BID  . clopidogrel  75 mg Oral Daily  . darbepoetin (ARANESP) injection - NON-DIALYSIS  100 mcg Subcutaneous Q Mon-1800  . famotidine  20 mg Oral Daily  . feeding supplement (NEPRO CARB STEADY)  1,000 mL Per Tube Q24H  . feeding supplement (PRO-STAT SUGAR FREE 64)  30 mL Per Tube TID  . Gerhardt's butt cream   Topical BID  . glycopyrrolate  1 mg Per Tube BID  . guaiFENesin  30 mL Per Tube Q6H  . insulin aspart  0-24 Units Subcutaneous Q4H  . insulin detemir  10 Units Subcutaneous BID  . mouth rinse  15 mL Mouth Rinse q12n4p  . midodrine  10 mg Oral TID WC  . multivitamin  15 mL Per Tube Daily   Continuous Infusions: . sodium chloride     PRN Meds:.albuterol, HYDROcodone-acetaminophen, ondansetron (ZOFRAN) IV, RESOURCE THICKENUP CLEAR, sodium chloride flush  Current Labs: reviewed    Physical Exam:  Blood pressure 118/67, pulse 83, temperature 98.5 F (36.9 C), temperature source Oral, resp. rate 17, height 5\' 1"  (1.549 m), weight 57.5 kg (126 lb 12.2 oz), SpO2 100 %. NAD, in chair, awake RRR Clear ant on auscultation No edema L IJ tunneled, c/d/i, bandaged Conversant, aao  A 1. New ESRD, start RRT 9/15; on TTS schedule 1. L IJ TDC 2. SCr on addmistion at [facility] 52mo ago was 1.63 3. No sig evidence to suggest recovery of GFR thus far 2. CAD s/p STEMI and ruptured post papllary with severe MR s/p MVR  08/23/16 3. DM2 4. Anemia on ESA 5. VDRF s/p Trach   P 1. Cont on THS schedule, 3K b ath, 2L UF, tight heparin, L IJ TDC 2. VVS to eval for permanent access 3. No outpatient HD until trach removed  [name] MD 09/27/2016, 8:42 AM   Recent Labs Lab 09/21/16 0733 09/24/16 1140 09/25/16 0400 09/26/16 0400 09/27/16 0232  NA 127* 132* 135 131* 137  K 4.1 3.3* 3.4* 3.6 3.6  CL 91* 93* 96* 93* 98*  CO2 24 25 28 27 27   GLUCOSE 141* 234* 89 145* 130*  BUN 71* 109* 41* 76* 40*  CREATININE 3.34* 4.70* 2.60* 3.76* 2.53*  CALCIUM 8.3* 8.7* 8.3* 8.5* 9.0  PHOS 3.3 5.2*  --   --   --     Recent Labs Lab 09/24/16 1140 09/25/16 0400 09/26/16 0718  WBC 16.5* 15.7* 13.7*  HGB 9.4* 9.3* 8.8*  HCT 29.6* 29.7* 27.9*  MCV 88.6 90.5 90.6  PLT 433* 388 393

## 2016-09-27 NOTE — Progress Notes (Signed)
ANTICOAGULATION CONSULT NOTE - Follow Up Consult  Pharmacy Consult for Coumadin Indication: Aflutter  No Known Allergies  Patient Measurements: Height: 5\' 1"  (154.9 cm) Weight: 126 lb 12.2 oz (57.5 kg) IBW/kg (Calculated) : 47.8  Vital Signs: Temp: 98.5 F (36.9 C) (10/25 0836) Temp Source: Oral (10/25 0836) BP: 118/67 (10/25 0818) Pulse Rate: 83 (10/25 0818)  Labs:  Recent Labs  09/24/16 1140 09/25/16 0400 09/26/16 0400 09/26/16 0718 09/27/16 0232  HGB 9.4* 9.3*  --  8.8*  --   HCT 29.6* 29.7*  --  27.9*  --   PLT 433* 388  --  393  --   LABPROT  --  28.8* 28.2*  --  20.4*  INR  --  2.65 2.58  --  1.73  CREATININE 4.70* 2.60* 3.76*  --  2.53*    Estimated Creatinine Clearance: 19.5 mL/min (by C-G formula based on SCr of 2.53 mg/dL (H)).   Assessment: 60yof continues on coumadin for aflutter> in SR after TEE/DCCV post-op. S/p heparin pre-op for IABP (removed 9/23), held post-op and d/c'd. Currently new ESRD, appetite improving per MD note. CBC stable, no bleed documented.  INR up to 4.62 and warfarin held, resumed 10/23. Per discussion with Dr. Prescott Gum on 10/24, hold warfarin with possible Vascular procedure for permanent HD access pending - per note, need INR<2. INR down to 1.73 this AM.  Goal of Therapy:  INR 2-2.5 per Dr. Prescott Gum Monitor platelets by anticoagulation protocol: Yes   Plan:  -Hold warfarin tonight per discussion with Dr. Prescott Gum -Daily INR -Monitor for s/sx bleeding -F/u Vascular plans for permanent access   Elicia Lamp, PharmD, BCPS Clinical Pharmacist 09/27/2016 10:12 AM

## 2016-09-27 NOTE — Progress Notes (Signed)
Physical Therapy Treatment Patient Details Name: Deborah [name] MRN: [id number] DOB: [birth year] Today's Date: 09/27/2016    History of Present Illness 60 yo admitted to [facility] 9/12 with STEMI develped cardiogenic shock with ETT and cRRT initiated on 9/14 after cath, transfer to [facility] 5/05, metabolic acidosis, underwent MVR 9/20, extubated 9/29, reintubated 9/30, 9/25-9/29 hemiplegia developed and resolved, trach and bronchoscopy 10/5. PMHx: DM, HTN, HLD    PT Comments    Pt able to continue to improve overall mobility and ambulation today.  Pt continues to require 2nd person for management of equipment and safety during ambulation.  Pt would benefit from CIR level of therapies at D/C.  Will continue to follow.    Follow Up Recommendations  CIR     Equipment Recommendations  Rolling walker with 5" wheels    Recommendations for Other Services       Precautions / Restrictions Precautions Precautions: Sternal;Fall Precaution Comments: trach, flexiseal, panda Restrictions Weight Bearing Restrictions: No    Mobility  Bed Mobility Overal bed mobility: Needs Assistance Bed Mobility: Supine to Sit     Supine to sit: Min assist     General bed mobility comments: Minimal cueing needed as pt brings LEs towards EOB without A.  Only A for help with bringing trunk to full sitting.    Transfers Overall transfer level: Needs assistance Equipment used: Pushed w/c Transfers: Sit to/from Stand Sit to Stand: Min assist;+2 safety/equipment         General transfer comment: A for anterior weightshift over BOS as pt tends to lean posteriorly and to R side.  Cues for UE placement.  Ambulation/Gait Ambulation/Gait assistance: Mod assist;+2 safety/equipment Ambulation Distance (Feet): 40 Feet (and 20) Assistive device:  (pushed W/C) Gait Pattern/deviations: Step-through pattern;Decreased stride length;Leaning posteriorly;Narrow base of support;Scissoring     General Gait Details: pt needs  consistent A for maintaining balance as pt tends to lean posteriorly and to R side made worse as pt fatigues.  pt with poor proprioceptive awareness and difficulty correcting balance.  pt fatigues easily and does not tell PT when she is needing to stop, but instead begins to require increased A and requires chair follow.     Stairs            Wheelchair Mobility    Modified Rankin (Stroke Patients Only)       Balance Overall balance assessment: Needs assistance Sitting-balance support: Single extremity supported;Bilateral upper extremity supported;Feet supported Sitting balance-Leahy Scale: Poor     Standing balance support: Bilateral upper extremity supported;During functional activity Standing balance-Leahy Scale: Poor                      Cognition Arousal/Alertness: Awake/alert Behavior During Therapy: Flat affect Overall Cognitive Status: No family/caregiver present to determine baseline cognitive functioning                      Exercises      General Comments        Pertinent Vitals/Pain Pain Assessment: Faces Faces Pain Scale: No hurt    Home Living   Living Arrangements: Children (lived with son Donta Mcinroy PTA) Available Help at Discharge: Available 24 hours/day;Other (Comment) (pt. to stay with daughter [name] who will work from home)                Prior Function            PT Goals (current goals can now be found in  the care plan section) Acute Rehab PT Goals Patient Stated Goal: Pt unable to state  PT Goal Formulation: Patient unable to participate in goal setting Time For Goal Achievement: 10/06/16 Potential to Achieve Goals: Fair Progress towards PT goals: Progressing toward goals    Frequency    Min 3X/week      PT Plan Current plan remains appropriate    Co-evaluation             End of Session Equipment Utilized During Treatment: Gait belt;Oxygen Activity Tolerance: Patient tolerated treatment  well Patient left: in chair;with call bell/phone within reach     Time: 1158-1222 PT Time Calculation (min) (ACUTE ONLY): 24 min  Charges:  $Gait Training: 23-37 mins                    G CodesCatarina Hartshorn, [place] 09/27/2016, 2:39 PM

## 2016-09-27 NOTE — Progress Notes (Signed)
Upper Extremity Vein Map   Bilateral basilic veins were not visualized.   Right Cephalic  Segment Diameter Depth Comment  1. Axilla mm mm   2. Mid upper arm mm mm   3. Above AC mm mm   4. In AC 2.1 mm 1.78 mm   5. Below AC 1.28 mm 5.86 mm   6. Mid forearm mm mm   7. Wrist mm mm    mm mm    mm mm    mm mm    Left Cephalic  Segment Diameter Depth Comment  1. Axilla mm mm   2. Mid upper arm mm mm   3. Above AC 1.23 mm 4.52 mm   4. In [facility] 1.47 mm 3.21 mm   5. Below AC 1.24 mm 4.26 mm   6. Mid forearm mm mm   7. Wrist mm mm    mm mm    mm mm    mm mm     Lita Mains- RDMS, RVT 11:00 AM  09/27/2016

## 2016-09-28 ENCOUNTER — Inpatient Hospital Stay (HOSPITAL_COMMUNITY): Payer: Medicaid Other

## 2016-09-28 LAB — BASIC METABOLIC PANEL
ANION GAP: 11 (ref 5–15)
BUN: 73 mg/dL — ABNORMAL HIGH (ref 6–20)
CALCIUM: 9 mg/dL (ref 8.9–10.3)
CO2: 27 mmol/L (ref 22–32)
Chloride: 96 mmol/L — ABNORMAL LOW (ref 101–111)
Creatinine, Ser: 3.72 mg/dL — ABNORMAL HIGH (ref 0.44–1.00)
GFR, EST AFRICAN AMERICAN: 14 mL/min — AB (ref >60)
GFR, EST NON AFRICAN AMERICAN: 12 mL/min — AB (ref >60)
Glucose, Bld: 138 mg/dL — ABNORMAL HIGH (ref 65–99)
Potassium: 4.2 mmol/L (ref 3.5–5.1)
Sodium: 134 mmol/L — ABNORMAL LOW (ref 135–145)

## 2016-09-28 LAB — GLUCOSE, CAPILLARY
GLUCOSE-CAPILLARY: 142 mg/dL — AB (ref 65–99)
GLUCOSE-CAPILLARY: 230 mg/dL — AB (ref 65–99)
GLUCOSE-CAPILLARY: 213 mg/dL — AB (ref 65–99)
Glucose-Capillary: 111 mg/dL — ABNORMAL HIGH (ref 65–99)
Glucose-Capillary: 119 mg/dL — ABNORMAL HIGH (ref 65–99)
Glucose-Capillary: 222 mg/dL — ABNORMAL HIGH (ref 65–99)

## 2016-09-28 LAB — CBC
HEMATOCRIT: 31.5 % — AB (ref 36.0–46.0)
HEMOGLOBIN: 9.9 g/dL — AB (ref 12.0–15.0)
MCH: 28.3 pg (ref 26.0–34.0)
MCHC: 31.4 g/dL (ref 30.0–36.0)
MCV: 90 fL (ref 78.0–100.0)
Platelets: 399 10*3/uL (ref 150–400)
RBC: 3.5 MIL/uL — ABNORMAL LOW (ref 3.87–5.11)
RDW: 15.7 % — ABNORMAL HIGH (ref 11.5–15.5)
WBC: 17.2 10*3/uL — ABNORMAL HIGH (ref 4.0–10.5)

## 2016-09-28 LAB — MRSA PCR SCREENING: MRSA by PCR: NEGATIVE

## 2016-09-28 LAB — PROTIME-INR
INR: 1.73
PROTHROMBIN TIME: 20.5 s — AB (ref 11.4–15.2)

## 2016-09-28 MED ORDER — DARBEPOETIN ALFA 100 MCG/0.5ML IJ SOSY: 100.0000 ug | PREFILLED_SYRINGE | INTRAMUSCULAR | Status: DC

## 2016-09-28 MED ORDER — NEPRO/CARBSTEADY PO LIQD
237.0000 mL | ORAL | Status: DC
  Filled 2016-09-28 (×5): qty 237

## 2016-09-28 MED ORDER — DARBEPOETIN ALFA 100 MCG/0.5ML IJ SOSY
100.0000 ug | PREFILLED_SYRINGE | INTRAMUSCULAR | Status: DC
  Administered 2016-10-03: 100 ug via INTRAVENOUS
  Filled 2016-09-28 (×2): qty 0.5

## 2016-09-28 MED ORDER — MIDODRINE HCL 5 MG PO TABS
ORAL_TABLET | ORAL | Status: AC
  Administered 2016-09-28: 10 mg via ORAL
  Filled 2016-09-28: qty 2

## 2016-09-28 MED ORDER — DARBEPOETIN ALFA 200 MCG/0.4ML IJ SOSY
PREFILLED_SYRINGE | INTRAMUSCULAR | Status: AC
  Filled 2016-09-28: qty 0.4

## 2016-09-28 MED ORDER — NEPRO/CARBSTEADY PO LIQD
1000.0000 mL | ORAL | Status: DC
  Administered 2016-09-28 – 2016-10-02 (×3): 1000 mL via ORAL
  Filled 2016-09-28 (×7): qty 1000

## 2016-09-28 NOTE — Progress Notes (Signed)
Inpatient Rehabilitation  Aware of today's MD note indicating that CLIP process can begin now that patient has been decannulated.  However, per  HD they have not received notification yet.  I am reaching out to team today in hopes of admitting patient to IP Rehab tomorrow.  Please call with questions.  Carmelia Roller., CCC/SLP Admission Coordinator  [place]  Cell [phone]

## 2016-09-28 NOTE — Progress Notes (Signed)
28 Days Post-Op Procedure(s) (LRB): INSERTION OF DIALYSIS CATHETER LEFT INTERNAL JUGULAR VEIN & INSERTION OF TRIPLE LUMEN RIGHT INTERNAL JUGULAR VEIN (Bilateral) Subjective: Not taking adequate po diet- resume TF at night eval by CIR Objective: Vital signs in last 24 hours: Temp:  [97.8 F (36.6 C)-99.2 F (37.3 C)] 98.1 F (36.7 C) (10/26 0645) Pulse Rate:  [72-92] 79 (10/26 0730) Cardiac Rhythm: Normal sinus rhythm (10/26 0645) Resp:  [11-25] 16 (10/26 0730) BP: (106-153)/(60-80) 106/71 (10/26 0730) SpO2:  [97 %-100 %] 100 % (10/26 0653) FiO2 (%):  [28 %] 28 % (10/25 0818) Weight:  [130 lb 4.7 oz (59.1 kg)-130 lb 8 oz (59.2 kg)] 130 lb 4.7 oz (59.1 kg) (10/26 0645)  Hemodynamic parameters for last 24 hours:  nsr  Intake/Output from previous day: 10/25 0701 - 10/26 0700 In: 210 [P.O.:180; NG/GT:30] Out: 200 [Stool:200] Intake/Output this shift: No intake/output data recorded.  Lungs clear No edema  Lab Results:  Recent Labs  09/26/16 0718  WBC 13.7*  HGB 8.8*  HCT 27.9*  PLT 393   BMET:  Recent Labs  09/27/16 0232 09/28/16 0231  NA 137 134*  K 3.6 4.2  CL 98* 96*  CO2 27 27  GLUCOSE 130* 138*  BUN 40* 73*  CREATININE 2.53* 3.72*  CALCIUM 9.0 9.0    PT/INR:  Recent Labs  09/28/16 0231  LABPROT 20.5*  INR 1.73   ABG    Component Value Date/Time   PHART 7.541 (H) 09/08/2016 0412   HCO3 25.2 09/08/2016 0412   TCO2 26 09/08/2016 0412   ACIDBASEDEF 4.1 (H) 09/02/2016 1850   O2SAT 85.8 09/22/2016 0503   CBG (last 3)   Recent Labs  09/27/16 1721 09/27/16 1937 09/28/16 0406  GLUCAP 253* 149* 142*    Assessment/Plan: S/P Procedure(s) (LRB): INSERTION OF DIALYSIS CATHETER LEFT INTERNAL JUGULAR VEIN & INSERTION OF TRIPLE LUMEN RIGHT INTERNAL JUGULAR VEIN (Bilateral) tx to step down Hold coumadin for HD access operation  LOS: 41 days    Deborah [name] 09/28/2016

## 2016-09-28 NOTE — Progress Notes (Signed)
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 amiodarone  200 mg Oral BID  . atorvastatin  20 mg Oral q1800  . chlorhexidine  15 mL Mouth Rinse BID  . clopidogrel  75 mg Oral Daily  . darbepoetin (ARANESP) injection - NON-DIALYSIS  100 mcg Subcutaneous Q Mon-1800  . famotidine  20 mg Oral Daily  . feeding supplement (PRO-STAT SUGAR FREE 64)  30 mL Per Tube TID  . Gerhardt's butt cream   Topical BID  . glycopyrrolate  1 mg Per Tube BID  . guaiFENesin  30 mL Per Tube Q6H  . insulin aspart  0-24 Units Subcutaneous Q4H  . insulin detemir  5 Units Subcutaneous BID  . mouth rinse  15 mL Mouth Rinse q12n4p  . midodrine  10 mg Oral TID WC  . multivitamin  15 mL Per Tube Daily   Continuous Infusions: . sodium chloride    . feeding supplement (NEPRO CARB STEADY)     PRN Meds:.albuterol, HYDROcodone-acetaminophen, ondansetron (ZOFRAN) IV, RESOURCE THICKENUP CLEAR, sodium chloride flush  Current Labs: reviewed    Physical Exam:  Blood pressure 95/60, pulse 85, temperature 98.1 F (36.7 C), temperature source Oral, resp. rate 18, height 5\' 1"  (1.549 m), weight 59.1 kg (130 lb 4.7 oz), SpO2 100 %. NAD, in chair, awake RRR Clear ant on auscultation No edema L IJ tunneled, c/d/i, bandaged Conversant, aao  A 1. New ESRD, start RRT 9/15; on TTS schedule 1. L IJ TDC 2. SCr on addmistion at [facility] 35mo ago was 1.63 3. No sig evidence to suggest recovery of GFR thus far 4. VVS following for permanent access 2. CAD s/p STEMI and ruptured post papllary with severe MR s/p MVR 08/23/16 3. DM2 4. Anemia on  ESA 5. VDRF s/p Trach   P 1. Cont on THS schedule 2. Based on disposition can begin CLIP now that trach removed; see if to CIR first  [name] MD 09/28/2016, 9:18 AM   Recent Labs Lab 09/24/16 1140  09/26/16 0400 09/27/16 0232 09/28/16 0231  NA 132*  < > 131* 137 134*  K 3.3*  < > 3.6 3.6 4.2  CL 93*  < > 93* 98* 96*  CO2 25  < > 27 27 27   GLUCOSE 234*  < > 145* 130* 138*  BUN 109*  < > 76* 40* 73*  CREATININE 4.70*  < > 3.76* 2.53* 3.72*  CALCIUM 8.7*  < > 8.5* 9.0 9.0  PHOS 5.2*  --   --   --   --   < > = values in this interval not displayed.  Recent Labs Lab 09/24/16 1140 09/25/16 0400 09/26/16 0718  WBC 16.5* 15.7* 13.7*  HGB 9.4* 9.3* 8.8*  HCT 29.6* 29.7* 27.9*  MCV 88.6 90.5 90.6  PLT 433* 388 393

## 2016-09-28 NOTE — Progress Notes (Signed)
Vein mapping reviewed.  Patient does not have adequate veins for AV fistula and will require AVGG.  I would like her to be off Plavix for a few days if possible for her graft.  Will schedule for early next week.  Annamarie Major

## 2016-09-28 NOTE — Progress Notes (Signed)
PT Cancellation Note  Patient Details Name: Deborah [name] MRN: [id number] DOB: [birth year]   Cancelled Treatment:    Reason Eval/Treat Not Completed: Patient at procedure or test/unavailable   [name] 09/28/2016, 7:41 AM Elwyn Reach, [place]

## 2016-09-28 NOTE — Progress Notes (Signed)
Speech Language Pathology Treatment: Dysphagia  Patient Details Name: Deborah [name] MRN: [id number] DOB: 1956/05/28 Today's Date: 09/28/2016 Time: 6962-9528 SLP Time Calculation (min) (ACUTE ONLY): 12 min  Assessment / Plan / Recommendation Clinical Impression  Pt seen for f/u dysphagia tx. Upon SLP arrival, she has c/o secretions in her throat, although her vocal quality is not wet. Min cues provided for mild pressure upon stoma dressing in order to provide more effortful cough. Pt reported some improvement, although nothing was expectorated. Pt consumed nectar thick liquids with Min cues for use of full chin tuck and small, single sips to reduce coughing that is otherwise concerning for possible penetration/aspiration. Recommend to continue current diet and precautions with full supervision.   HPI HPI: 60 yo female admitted to [facility] 08/15/16 with STEMI s/p cath to [place]. Developed cardiogenic shock, VDRF (ETT 9/14-9/29), AKI with metabolic acidosis and started on CRRT 08/17/16. Transferred to [facility] to tx shock and evaluated new severe MR from papillary muscle rupture. She also had fever and found to have Group B Strep in sputum culture.Mitral valve replacement 9/20. Swallow evaluation 9/30 with recs for NPO given poor mentation, severely impaired swallow function s/p prolonged intubation. Reintubated 9/30 secondary to septic shock; trach 10/5; new onset right hemiplegia and right gaze preference.  CT negative for acute changes; dx acute encephalopathy.       SLP Plan  Goals updated (pt decannulated, no longer needs PMV)     Recommendations  Diet recommendations: Dysphagia 2 (fine chop);Nectar-thick liquid Liquids provided via: Straw Medication Administration: Whole meds with puree Supervision: Full supervision/cueing for compensatory strategies;Staff to assist with self feeding Compensations: Minimize environmental distractions;Slow rate;Small sips/bites;Chin tuck;Use straw to facilitate chin  tuck Postural Changes and/or Swallow Maneuvers: Seated upright 90 degrees;Upright 30-60 min after meal                Oral Care Recommendations: Oral care BID Follow up Recommendations: Inpatient Rehab Plan: Goals updated (pt decannulated, no longer needs PMV)       GO                Germain Osgood 09/28/2016, 3:30 PM  Germain Osgood, M.A. CCC-SLP [phone]

## 2016-09-29 LAB — GLUCOSE, CAPILLARY
GLUCOSE-CAPILLARY: 190 mg/dL — AB (ref 65–99)
GLUCOSE-CAPILLARY: 206 mg/dL — AB (ref 65–99)
Glucose-Capillary: 184 mg/dL — ABNORMAL HIGH (ref 65–99)
Glucose-Capillary: 190 mg/dL — ABNORMAL HIGH (ref 65–99)
Glucose-Capillary: 302 mg/dL — ABNORMAL HIGH (ref 65–99)
Glucose-Capillary: 189 mg/dL — ABNORMAL HIGH (ref 65–99)

## 2016-09-29 LAB — BASIC METABOLIC PANEL
Anion gap: 10 (ref 5–15)
BUN: 43 mg/dL — AB (ref 6–20)
CHLORIDE: 98 mmol/L — AB (ref 101–111)
CO2: 25 mmol/L (ref 22–32)
CREATININE: 2.74 mg/dL — AB (ref 0.44–1.00)
Calcium: 9.3 mg/dL (ref 8.9–10.3)
GFR calc Af Amer: 21 mL/min — ABNORMAL LOW (ref >60)
GFR calc non Af Amer: 18 mL/min — ABNORMAL LOW (ref >60)
GLUCOSE: 200 mg/dL — AB (ref 65–99)
POTASSIUM: 3.5 mmol/L (ref 3.5–5.1)
SODIUM: 133 mmol/L — AB (ref 135–145)

## 2016-09-29 LAB — PROTIME-INR
INR: 1.75
Prothrombin Time: 20.6 seconds — ABNORMAL HIGH (ref 11.4–15.2)

## 2016-09-29 MED ORDER — INSULIN DETEMIR 100 UNIT/ML ~~LOC~~ SOLN
8.0000 [IU] | Freq: Two times a day (BID) | SUBCUTANEOUS | Status: DC
  Administered 2016-09-29 – 2016-09-30 (×3): 8 [IU] via SUBCUTANEOUS
  Filled 2016-09-29 (×5): qty 0.08

## 2016-09-29 MED ORDER — BENZONATATE 100 MG PO CAPS
100.0000 mg | ORAL_CAPSULE | Freq: Three times a day (TID) | ORAL | Status: DC | PRN
  Administered 2016-09-29 – 2016-10-02 (×5): 100 mg via ORAL
  Filled 2016-09-29 (×5): qty 1

## 2016-09-29 NOTE — Progress Notes (Addendum)
Nutrition Follow-up  DOCUMENTATION CODES:   Not applicable  INTERVENTION:  48 hour calorie count initiated.   Continue nocturnal tube feeds of Nepro Carb Steady formula via Cortrak NGT at goal rate of 35 ml/hr x 14 hours (8pm-6am).  Continue 30 ml Prostat TID per tube, each supplement provides 100 kcal and 15 grams of protein.   TF regimen provides 1182 kcal (70% of kcal needs), 85 grams of protein (94% of protein needs), and 358 ml of free water.   Encourage adequate PO intake during the day.  NUTRITION DIAGNOSIS:   Inadequate oral intake related to inability to eat as evidenced by NPO status; diet advanced; po 20%  GOAL:   Patient will meet greater than or equal to 90% of their needs; met mostly via TF  MONITOR:   PO intake, Diet advancement, Labs, Weight trends, TF tolerance, Skin, I & O's  REASON FOR ASSESSMENT:   Consult Enteral/tube feeding initiation and management  ASSESSMENT:   60 y/o woman with DM2, HTN, HL and noncompliance presented to [facility] ER with infrerior posterior STEMI on 9/12. Taken to cath lab and found to have occluded small to moderate OM-1 branch. Underwent successful PCS with stent x 2 by Dr. Clayborn Bigness. Coronaries otherwise ok. EF 55-60% with severe MR confirmed by echo.  Pt is new ESRD on HD. Trach removed 10/25.  Pt s/p procedures 9/20: MITRAL VALVE REPAIR (MVR)  CORONARY ARTERY BYPASS GRAFTING (CABG)    RD consulted for calorie count and possible weaning of tube feeding. Meal completion has been 20%. Pt reports having a lack of taste for food and says the chopped meats at meals have been dry. RD to make note in healthtouch to add gravies to meat to improve palatability. Pt currently has nocturnal tube feeds orders to allow po intake during the day. Pt reports tolerating her tube feeds fine at night with no other difficulties. Pt encouraged to eat her food at meals and educated on the importance of adequate protein intake. Pt expressed understanding.  RD to continue with current TF orders as po continues to be poor. Calorie count has been initiated. Discussed new order with RN. RD to follow up Monday 10/30 with full calorie count results and will decide on modification of tube feeding orders.  Labs and medications reviewed. CBG's 119-302 mg/dL.  Diet Order:  DIET DYS 2 Room service appropriate? Yes with Assist; Fluid consistency: Nectar Thick  Skin:  Wound (see comment) (Stage 2 to buttocks and neck)  Last BM:  10/27-rectal tube-400 ml  Height:   Ht Readings from Last 1 Encounters:  09/29/16 _0  (1.549 m)    Weight:   Wt Readings from Last 1 Encounters:  09/29/16 130 lb 11.7 oz (59.3 kg)    Ideal Body Weight:  47.7 kg  BMI:  Body mass index is 24.7 kg/m.  Estimated Nutritional Needs:   Kcal:  1700-1900  Protein:  90-110 grams  Fluid:  per MD  EDUCATION NEEDS:   No education needs identified at this time  Corrin Parker, MS, RD, LDN Pager # [phone] After hours/ weekend pager # [phone]

## 2016-09-29 NOTE — Progress Notes (Signed)
Admit: 08/18/2016 LOS: 71  64F with AoCKD3 s/p STEMI s/p PCI and infarct of post MV papillary muscle s/p MVR 9/20  Subjective:   VVS plans for AVG next week For CIR HD yesterday, post weight 55.2kg, 1.2L UF (? If weight is correct)  10/26 0701 - 10/27 0700 In: 869.4 [NG/GT:869.4] Out: 1588 [Stool:400]  Filed Weights   09/28/16 0645 09/28/16 1023 09/29/16 0335  Weight: 59.1 kg (130 lb 4.7 oz) 55.2 kg (121 lb 11.1 oz) 59.3 kg (130 lb 11.7 oz)    Scheduled Meds: . amiodarone  200 mg Oral BID  . atorvastatin  20 mg Oral q1800  . chlorhexidine  15 mL Mouth Rinse BID  . clopidogrel  75 mg Oral Daily  . [START ON 10/03/2016] darbepoetin (ARANESP) injection - DIALYSIS  100 mcg Intravenous Q Tue-HD  . famotidine  20 mg Oral Daily  . feeding supplement (PRO-STAT SUGAR FREE 64)  30 mL Per Tube TID  . Gerhardt's butt cream   Topical BID  . glycopyrrolate  1 mg Per Tube BID  . guaiFENesin  30 mL Per Tube Q6H  . insulin aspart  0-24 Units Subcutaneous Q4H  . insulin detemir  5 Units Subcutaneous BID  . mouth rinse  15 mL Mouth Rinse q12n4p  . midodrine  10 mg Oral TID WC  . multivitamin  15 mL Per Tube Daily   Continuous Infusions: . sodium chloride    . feeding supplement (NEPRO CARB STEADY) 1,000 mL (09/28/16 1239)   PRN Meds:.albuterol, HYDROcodone-acetaminophen, ondansetron (ZOFRAN) IV, RESOURCE THICKENUP CLEAR, sodium chloride flush  Current Labs: reviewed    Physical Exam:  Blood pressure 128/72, pulse 78, temperature 98.3 F (36.8 C), temperature source Oral, resp. rate 18, height 5\' 1"  (1.549 m), weight 59.3 kg (130 lb 11.7 oz), SpO2 98 %. NAD, in chair, awake RRR Clear ant on auscultation No edema L IJ tunneled, c/d/i, bandaged Conversant, aao  A 1. New ESRD, start RRT 9/15; on TTS schedule 1. L IJ TDC 2. SCr on addmistion at [facility] 57mo ago was 1.63 3. No sig evidence to suggest recovery of GFR thus far 4. VVS following for permanent access 2. CAD s/p STEMI and  ruptured post papllary with severe MR s/p MVR 08/23/16 3. DM2 4. Anemia on ESA 5. VDRF s/p Trach   P 1. Cont on THS schedule 2. CLIP Pt even tough will be in CIR for unkonwn time period per their guidelines for admission  [name] MD 09/29/2016, 9:30 AM   Recent Labs Lab 09/24/16 1140  09/27/16 0232 09/28/16 0231 09/29/16 0314  NA 132*  < > 137 134* 133*  K 3.3*  < > 3.6 4.2 3.5  CL 93*  < > 98* 96* 98*  CO2 25  < > 27 27 25   GLUCOSE 234*  < > 130* 138* 200*  BUN 109*  < > 40* 73* 43*  CREATININE 4.70*  < > 2.53* 3.72* 2.74*  CALCIUM 8.7*  < > 9.0 9.0 9.3  PHOS 5.2*  --   --   --   --   < > = values in this interval not displayed.  Recent Labs Lab 09/25/16 0400 09/26/16 0718 09/28/16 1007  WBC 15.7* 13.7* 17.2*  HGB 9.3* 8.8* 9.9*  HCT 29.7* 27.9* 31.5*  MCV 90.5 90.6 90.0  PLT [phone]

## 2016-09-29 NOTE — Progress Notes (Signed)
      [address]       [place] [address]             [phone]        29 Days Post-Op Procedure(s) (LRB): INSERTION OF DIALYSIS CATHETER LEFT INTERNAL JUGULAR VEIN & INSERTION OF TRIPLE LUMEN RIGHT INTERNAL JUGULAR VEIN (Bilateral)  Subjective: Patient on HD this am. Has cough  Objective: Vital signs in last 24 hours: Temp:  [97.8 F (36.6 C)-99.6 F (37.6 C)] 98.3 F (36.8 C) (10/27 0730) Pulse Rate:  [79-96] 79 (10/27 0730) Cardiac Rhythm: Heart block;Other (Comment) (10/26 1900) Resp:  [13-22] 20 (10/27 0730) BP: (87-138)/(60-87) 128/72 (10/27 0730) SpO2:  [94 %-100 %] 98 % (10/27 0730) Weight:  [121 lb 11.1 oz (55.2 kg)-130 lb 11.7 oz (59.3 kg)] 130 lb 11.7 oz (59.3 kg) (10/27 0335)  Pre op weight 78 kg Current Weight  09/29/16 130 lb 11.7 oz (59.3 kg)       Intake/Output from previous day: 10/26 0701 - 10/27 0700 In: 869.4 [NG/GT:869.4] Out: 1588 [Stool:400]   Physical Exam:  Cardiovascular: RRR Pulmonary: Mostly clear Abdomen: Soft, non tender, bowel sounds present. Extremities: No lower extremity edema. Wounds: Clean and dry.  No erythema or signs of infection.  Lab Results: CBC: Recent Labs  09/28/16 1007  WBC 17.2*  HGB 9.9*  HCT 31.5*  PLT 399   BMET:  Recent Labs  09/28/16 0231 09/29/16 0314  NA 134* 133*  K 4.2 3.5  CL 96* 98*  CO2 27 25  GLUCOSE 138* 200*  BUN 73* 43*  CREATININE 3.72* 2.74*  CALCIUM 9.0 9.3    PT/INR:  Lab Results  Component Value Date   INR 1.75 09/29/2016   INR 1.73 09/28/2016   INR 1.73 09/27/2016   ABG:  INR: Will add last result for INR, ABG once components are confirmed Will add last 4 CBG results once components are confirmed  Assessment/Plan:  1. CV - S/p STEMI, s/p PCI,s/p MV replacement. On Amiodarone 200 mg bid and Midodrine 10 mg tid. MAP 90' to low 100's. Plavix on hold as going to OR on Monday. 2.  Pulmonary - On room air. Tessalon Perles and Robitussin as  needed.  Encourage incentive spirometer. 3. DM-CBGs 189/190/282. On Metformin pre op. Only on Insulin post op. Pre op HGA1C 13.3. Will need close medical follow up after discharge. 4.  Anemia - H and H yesterday stable at 10.2 and 32.6 5. ESRD-Creatinine this am up rom 2.74 to 3.72. Patient without adequate veins for AV fistula so will need AVGG. Per Dr. Trula Slade, to be done Monday. On HD this am and nephrology following as well. 6. GI-Diet is dysphagia 2. Continue with nocturnal TFs and nutrition recommendations  Kela Baccari MPA-C 09/29/2016,8:33 AM

## 2016-09-29 NOTE — Progress Notes (Addendum)
      [address]       [place] [address]             [phone]      29 Days Post-Op Procedure(s) (LRB): INSERTION OF DIALYSIS CATHETER LEFT INTERNAL JUGULAR VEIN & INSERTION OF TRIPLE LUMEN RIGHT INTERNAL JUGULAR VEIN (Bilateral) Subjective: Complains of cough and tingling in her feet.  Objective: Vital signs in last 24 hours: Temp:  [98.3 F (36.8 C)-99.5 F (37.5 C)] 98.5 F (36.9 C) (10/27 1148) Pulse Rate:  [78-96] 88 (10/27 1148) Cardiac Rhythm: Normal sinus rhythm (10/27 0800) Resp:  [13-22] 21 (10/27 1148) BP: (104-138)/(67-87) 132/67 (10/27 1148) SpO2:  [94 %-100 %] 100 % (10/27 1148) Weight:  [130 lb 11.7 oz (59.3 kg)] 130 lb 11.7 oz (59.3 kg) (10/27 0335)     Intake/Output from previous day: 10/26 0701 - 10/27 0700 In: 869.4 [NG/GT:869.4] Out: 1588 [Stool:400] Intake/Output this shift: No intake/output data recorded.  General appearance: cooperative and no distress Heart: regular rate and rhythm, S1, S2 normal, no murmur, click, rub or gallop Lungs: CTA bilaterally Abdomen: soft, non-tender; bowel sounds normal; no masses,  no organomegaly Extremities: extremities normal, atraumatic, no cyanosis or edema Wound: c/d/i without drainage  Lab Results:  Recent Labs  09/28/16 1007  WBC 17.2*  HGB 9.9*  HCT 31.5*  PLT 399   BMET:  Recent Labs  09/28/16 0231 09/29/16 0314  NA 134* 133*  K 4.2 3.5  CL 96* 98*  CO2 27 25  GLUCOSE 138* 200*  BUN 73* 43*  CREATININE 3.72* 2.74*  CALCIUM 9.0 9.3    PT/INR:  Recent Labs  09/29/16 0314  LABPROT 20.6*  INR 1.75   ABG    Component Value Date/Time   PHART 7.541 (H) 09/08/2016 0412   HCO3 25.2 09/08/2016 0412   TCO2 26 09/08/2016 0412   ACIDBASEDEF 4.1 (H) 09/02/2016 1850   O2SAT 85.8 09/22/2016 0503   CBG (last 3)   Recent Labs  09/29/16 0343 09/29/16 0731 09/29/16 1134  GLUCAP 184* 190* 302*    Assessment/Plan: S/P Procedure(s) (LRB): INSERTION OF DIALYSIS  CATHETER LEFT INTERNAL JUGULAR VEIN & INSERTION OF TRIPLE LUMEN RIGHT INTERNAL JUGULAR VEIN (Bilateral)  S/p MVR, endoscopic harvest of the left greater saphenous vein, placement of a right subclavian vein triple-lumen catheter for IV access  1. CV- NSR rate in the 80s. BP good, on midodrine. On amio Po. Still with EPW 2. Pulm- 100% on room air. Continue albuterol. Encourage IS use hourly. Cough- will try Tessalon perles for symptoms.  3. Abd- continue pepcid and bowel regimen. Tolerating thickened liquids. NG tube in place. Consult to nutrition for calorie count and weaning of tube feeds.  4. Renal- ESDR, renal following. Creatinine 2.74. Slight hyponatremia. 5. Endocrine-poor control. On Levemir and novolog. Increase Levemir to 8 units BID. Neuropathy symptoms-will defer to Renal for dosing of Gabapentin  6. Anticoagulation- on coumadin which is currently held. On Plavix. She will need an Kalida placed early next week and Vascular would like her off Plavix.      LOS: 42 days    Elgie Collard 09/29/2016 patient examined and medical record reviewed,agree with above note. Stop Plavix tomorrow Tharon Aquas Trigt III 09/29/2016

## 2016-09-29 NOTE — Progress Notes (Signed)
Physical Therapy Treatment Patient Details Name: Deborah [name] MRN: [id number] DOB: 1956/08/03 Today's Date: 09/29/2016    History of Present Illness 60 yo admitted to [facility] 9/12 with STEMI develped cardiogenic shock with ETT and cRRT initiated on 9/14 after cath, transfer to [facility] 0/35, metabolic acidosis, underwent MVR 9/20, extubated 9/29, reintubated 9/30, 9/25-9/29 hemiplegia developed and resolved, trach and bronchoscopy 10/5. PMHx: DM, HTN, HLD s/p trach removal 10/25.    PT Comments    Patient progressing well towards PT goals. Continues to fatigue during ambulation limiting distance and requires assist with balance, RW management and cues for safety. Pt with impaired safety awareness. Increased frequency of coughing during mobility- pt reports this as painful. VSS throughout session. Appropriate for CIR. Will continue to follow.   Follow Up Recommendations  CIR     Equipment Recommendations  Rolling walker with 5" wheels    Recommendations for Other Services       Precautions / Restrictions Precautions Precautions: Sternal;Fall Precaution Comments: flexiseal, panda Restrictions Weight Bearing Restrictions: Yes (sternal precautions)    Mobility  Bed Mobility Overal bed mobility: Needs Assistance Bed Mobility: Sit to Sidelying Rolling: Min assist       Sit to sidelying: Min assist General bed mobility comments: Assist to bring 1 LE into bed; when pt attempting to scoot bottom back on bed, LOB anteriorly requiring mod A to prevent falling forward. Rolling to Rt/left for pericare as flexiseal leaking.  Transfers Overall transfer level: Needs assistance Equipment used: Rolling walker (2 wheeled) Transfers: Sit to/from Stand Sit to Stand: Min assist;+2 safety/equipment         General transfer comment: Cues for hand placement; posterior lean upon standing with bil knee instability; assist to steady and boost.  Ambulation/Gait Ambulation/Gait assistance: Mod  assist;+2 safety/equipment Ambulation Distance (Feet): 45 Feet Assistive device: Rolling walker (2 wheeled) Gait Pattern/deviations: Step-through pattern;Decreased stride length;Narrow base of support;Scissoring   Gait velocity interpretation: Below normal speed for age/gender General Gait Details: Consistent cues for balance as pt tends to veer right and needs assist with RW management; poor proprioceptive awareness and difficulty correcting balance. Fatigues. Mod A for stability due to narrow BoS and scissoring at times.    Stairs            Wheelchair Mobility    Modified Rankin (Stroke Patients Only)       Balance Overall balance assessment: Needs assistance Sitting-balance support: Feet supported;No upper extremity supported Sitting balance-Leahy Scale: Fair Sitting balance - Comments: No assist needed intially but with scooting back into bed pt with LOB anteriorly needing assist to prevent fall forward.   Standing balance support: During functional activity Standing balance-Leahy Scale: Poor Standing balance comment: Reliant on BUEs for support and Min A for balance.                     Cognition Arousal/Alertness: Awake/alert Behavior During Therapy: Flat affect Overall Cognitive Status: Impaired/Different from baseline Area of Impairment: Safety/judgement;Following commands;Problem solving       Following Commands: Follows one step commands with increased time;Follows multi-step commands consistently (repetition required at times. ) Safety/Judgement: Decreased awareness of safety   Problem Solving: Slow processing      Exercises      General Comments General comments (skin integrity, edema, etc.): VSS.      Pertinent Vitals/Pain Pain Assessment: Faces Faces Pain Scale: Hurts little more Pain Location: chest Pain Descriptors / Indicators: Sore Pain Intervention(s): Monitored during session;Repositioned;Limited activity within patient's  tolerance    Home Living                      Prior Function            PT Goals (current goals can now be found in the care plan section) Progress towards PT goals: Progressing toward goals    Frequency    Min 3X/week      PT Plan Current plan remains appropriate    Co-evaluation             End of Session Equipment Utilized During Treatment: Gait belt Activity Tolerance: Patient tolerated treatment well Patient left: in bed;with call bell/phone within reach;with bed alarm set     Time: 1109-1130 PT Time Calculation (min) (ACUTE ONLY): 21 min  Charges:  $Gait Training: 8-22 mins                    G Codes:      Crit Obremski A Veva Grimley 09/29/2016, 12:01 PM Wray Kearns, [place], DPT [phone]

## 2016-09-29 NOTE — Progress Notes (Addendum)
Inpatient Rehabilitation  Contacted Dr. Joelyn Oms regarding need for CLIP process to be initiated prior to IP Rehab admission.  Additionally, per chart review, note that graft placement will take place early next week.  After reviewing the case with our medical director will plan to follow along for timing of medical readiness in hopes of admitting next week after graft is placed.  Plan for my co-worker Gerlean Ren to follow up Monday 10/30.  Please call with questions.   Carmelia Roller., CCC/SLP Admission Coordinator  [facility]  Cell [phone]

## 2016-09-29 NOTE — Progress Notes (Addendum)
Subjective  -   No complaints at this time Learning to talk now that she has been decannulated   Physical Exam:  Palpable left radial pulse No significant swelling in her upper extremities    Assessment/Plan:    I discussed with the patient and her daughter, that she does not have an adequate vein for fistula creation, by ultrasound.  Therefore I have recommended proceeding with a left upper arm dialysis graft.  Her Coumadin levels are decreasing.  She was placed on Plavix for a drug-eluting stent.  I have discussed this with PVT, and we can stop Plavix.  It was held in this morning.  She is on the schedule for Monday.  Risks and benefits of the procedure were discussed, including the risk of steal, graft failure, infection area and all questions were answered and they're willing to proceed.  Annamarie Major 09/29/2016 5:43 PM --  Vitals:   09/29/16 1148 09/29/16 1538  BP: 132/67 (!) 143/80  Pulse: 88 87  Resp: (!) 21 18  Temp: 98.5 F (36.9 C) 99.5 F (37.5 C)    Intake/Output Summary (Last 24 hours) at 09/29/16 1743 Last data filed at 09/29/16 0345  Gross per 24 hour  Intake              140 ml  Output              400 ml  Net             -260 ml     Laboratory CBC    Component Value Date/Time   WBC 17.2 (H) 09/28/2016 1007   HGB 9.9 (L) 09/28/2016 1007   HCT 31.5 (L) 09/28/2016 1007   PLT 399 09/28/2016 1007    BMET    Component Value Date/Time   NA 133 (L) 09/29/2016 0314   K 3.5 09/29/2016 0314   CL 98 (L) 09/29/2016 0314   CO2 25 09/29/2016 0314   GLUCOSE 200 (H) 09/29/2016 0314   BUN 43 (H) 09/29/2016 0314   CREATININE 2.74 (H) 09/29/2016 0314   CREATININE 1.41 (H) 10/04/2012 1631   CALCIUM 9.3 09/29/2016 0314   GFRNONAA 18 (L) 09/29/2016 0314   GFRAA 21 (L) 09/29/2016 0314    COAG Lab Results  Component Value Date   INR 1.75 09/29/2016   INR 1.73 09/28/2016   INR 1.73 09/27/2016   No results found for:  PTT  Antibiotics Anti-infectives    Start     Dose/Rate Route Frequency Ordered Stop   09/11/16 2200  imipenem-cilastatin (PRIMAXIN) 250 mg in sodium chloride 0.9 % 100 mL IVPB     250 mg 200 mL/hr over 30 Minutes Intravenous Every 12 hours 09/11/16 1054 09/16/16 2307   09/06/16 1500  imipenem-cilastatin (PRIMAXIN) 500 mg in sodium chloride 0.9 % 100 mL IVPB  Status:  Discontinued     500 mg 200 mL/hr over 30 Minutes Intravenous Every 8 hours 09/06/16 1418 09/11/16 1054   09/04/16 1000  fluconazole (DIFLUCAN) IVPB 100 mg  Status:  Discontinued     100 mg 50 mL/hr over 60 Minutes Intravenous Every 24 hours 09/04/16 0836 09/08/16 0750   09/02/16 1400  piperacillin-tazobactam (ZOSYN) IVPB 3.375 g  Status:  Discontinued     3.375 g 100 mL/hr over 30 Minutes Intravenous Every 6 hours 09/02/16 1050 09/06/16 1418   09/02/16 1100  vancomycin (VANCOCIN) IVPB 750 mg/150 ml premix     750 mg 150 mL/hr over 60 Minutes Intravenous Every 24  hours 09/02/16 1050 09/08/16 1235   09/01/16 0800  vancomycin (VANCOCIN) IVPB 750 mg/150 ml premix  Status:  Discontinued     750 mg 150 mL/hr over 60 Minutes Intravenous Every 24 hours 08/31/16 1109 09/01/16 1256   08/29/16 0900  fluconazole (DIFLUCAN) IVPB 400 mg     400 mg 100 mL/hr over 120 Minutes Intravenous Every 24 hours 08/28/16 1045 08/30/16 1154   08/28/16 1400  piperacillin-tazobactam (ZOSYN) IVPB 3.375 g  Status:  Discontinued     3.375 g 12.5 mL/hr over 240 Minutes Intravenous Every 8 hours 08/28/16 0721 08/28/16 1042   08/28/16 1400  piperacillin-tazobactam (ZOSYN) IVPB 3.375 g     3.375 g 100 mL/hr over 30 Minutes Intravenous Every 6 hours 08/28/16 1043 09/01/16 2029   08/28/16 0900  fluconazole (DIFLUCAN) IVPB 100 mg  Status:  Discontinued     100 mg 50 mL/hr over 60 Minutes Intravenous Every 24 hours 08/28/16 0806 08/28/16 1045   08/28/16 0800  vancomycin (VANCOCIN) IVPB 1000 mg/200 mL premix  Status:  Discontinued     1,000 mg 200 mL/hr  over 60 Minutes Intravenous Every 24 hours 08/28/16 0721 08/31/16 1109   08/28/16 0730  piperacillin-tazobactam (ZOSYN) IVPB 3.375 g     3.375 g 100 mL/hr over 30 Minutes Intravenous  Once 08/28/16 0721 08/28/16 0817   08/26/16 2000  cefTRIAXone (ROCEPHIN) 2 g in dextrose 5 % 50 mL IVPB    Comments:  Last dose 9/23   2 g 100 mL/hr over 30 Minutes Intravenous Every 24 hours 08/26/16 0749 08/26/16 2014   08/23/16 2200  vancomycin (VANCOCIN) 500 mg in sodium chloride 0.9 % 100 mL IVPB     500 mg 100 mL/hr over 60 Minutes Intravenous Every 24 hours 08/23/16 1752 08/24/16 2300   08/23/16 2130  vancomycin (VANCOCIN) IVPB 1000 mg/200 mL premix  Status:  Discontinued     1,000 mg 200 mL/hr over 60 Minutes Intravenous  Once 08/23/16 1553 08/23/16 1752   08/23/16 0400  vancomycin (VANCOCIN) 1,250 mg in sodium chloride 0.9 % 250 mL IVPB  Status:  Discontinued     1,250 mg 166.7 mL/hr over 90 Minutes Intravenous To Surgery 08/22/16 0834 08/22/16 1432   08/23/16 0400  cefUROXime (ZINACEF) 1.5 g in dextrose 5 % 50 mL IVPB  Status:  Discontinued     1.5 g 100 mL/hr over 30 Minutes Intravenous To Surgery 08/22/16 0834 08/22/16 1432   08/23/16 0400  cefUROXime (ZINACEF) 750 mg in dextrose 5 % 50 mL IVPB  Status:  Discontinued     750 mg 100 mL/hr over 30 Minutes Intravenous To Surgery 08/22/16 0833 08/22/16 1432   08/23/16 0400  vancomycin (VANCOCIN) 1,250 mg in sodium chloride 0.9 % 250 mL IVPB  Status:  Discontinued     1,250 mg 166.7 mL/hr over 90 Minutes Intravenous To Surgery 08/22/16 1433 08/23/16 0838   08/23/16 0400  cefUROXime (ZINACEF) 1.5 g in dextrose 5 % 50 mL IVPB  Status:  Discontinued     1.5 g 100 mL/hr over 30 Minutes Intravenous To Surgery 08/22/16 1433 08/23/16 1347   08/23/16 0400  cefUROXime (ZINACEF) 750 mg in dextrose 5 % 50 mL IVPB  Status:  Discontinued     750 mg 100 mL/hr over 30 Minutes Intravenous To Surgery 08/22/16 1432 08/23/16 0838   08/21/16 1800  cefTRIAXone  (ROCEPHIN) 2 g in dextrose 5 % 50 mL IVPB  Status:  Discontinued     2 g 100 mL/hr over 30  Minutes Intravenous Every 24 hours 08/21/16 1123 08/26/16 0749   08/21/16 0600  vancomycin (VANCOCIN) IVPB 1000 mg/200 mL premix  Status:  Discontinued     1,000 mg 200 mL/hr over 60 Minutes Intravenous Every 48 hours 08/20/16 0613 08/21/16 1052   08/20/16 1000  piperacillin-tazobactam (ZOSYN) IVPB 2.25 g  Status:  Discontinued     2.25 g 100 mL/hr over 30 Minutes Intravenous Every 8 hours 08/20/16 0616 08/21/16 1052   08/19/16 1200  piperacillin-tazobactam (ZOSYN) IVPB 3.375 g  Status:  Discontinued     3.375 g 12.5 mL/hr over 240 Minutes Intravenous Every 6 hours 08/19/16 0942 08/19/16 1003   08/19/16 1015  vancomycin (VANCOCIN) IVPB 1000 mg/200 mL premix     1,000 mg 200 mL/hr over 60 Minutes Intravenous  Once 08/19/16 1013 08/19/16 1146   08/19/16 1015  piperacillin-tazobactam (ZOSYN) IVPB 3.375 g  Status:  Discontinued     3.375 g 12.5 mL/hr over 240 Minutes Intravenous Every 8 hours 08/19/16 1013 08/20/16 0615   08/19/16 0945  vancomycin (VANCOCIN) IVPB 1000 mg/200 mL premix  Status:  Discontinued    Comments:  Trough level and dose per pharmD   1,000 mg 200 mL/hr over 60 Minutes Intravenous  Once 08/19/16 0942 08/19/16 1002       V. Leia Alf, M.D. [facility] Office: [phone] Pager:  [phone]

## 2016-09-30 LAB — BASIC METABOLIC PANEL
ANION GAP: 10 (ref 5–15)
Anion gap: 13 (ref 5–15)
BUN: 65 mg/dL — ABNORMAL HIGH (ref 6–20)
BUN: 20 mg/dL (ref 6–20)
CALCIUM: 9.5 mg/dL (ref 8.9–10.3)
CALCIUM: 8.9 mg/dL (ref 8.9–10.3)
CO2: 23 mmol/L (ref 22–32)
CO2: 26 mmol/L (ref 22–32)
CREATININE: 3.79 mg/dL — AB (ref 0.44–1.00)
Chloride: 96 mmol/L — ABNORMAL LOW (ref 101–111)
Chloride: 97 mmol/L — ABNORMAL LOW (ref 101–111)
Creatinine, Ser: 2.05 mg/dL — ABNORMAL HIGH (ref 0.44–1.00)
GFR, EST AFRICAN AMERICAN: 14 mL/min — AB (ref >60)
GFR, EST AFRICAN AMERICAN: 29 mL/min — AB (ref >60)
GFR, EST NON AFRICAN AMERICAN: 12 mL/min — AB (ref >60)
GFR, EST NON AFRICAN AMERICAN: 25 mL/min — AB (ref >60)
GLUCOSE: 252 mg/dL — AB (ref 65–99)
Glucose, Bld: 238 mg/dL — ABNORMAL HIGH (ref 65–99)
POTASSIUM: 3.3 mmol/L — AB (ref 3.5–5.1)
Potassium: 3.5 mmol/L (ref 3.5–5.1)
SODIUM: 132 mmol/L — AB (ref 135–145)
Sodium: 133 mmol/L — ABNORMAL LOW (ref 135–145)

## 2016-09-30 LAB — CBC
HEMATOCRIT: 32.6 % — AB (ref 36.0–46.0)
HEMATOCRIT: 33.3 % — AB (ref 36.0–46.0)
HEMOGLOBIN: 10.2 g/dL — AB (ref 12.0–15.0)
HEMOGLOBIN: 10.6 g/dL — AB (ref 12.0–15.0)
MCH: 28.2 pg (ref 26.0–34.0)
MCH: 28.7 pg (ref 26.0–34.0)
MCHC: 31.3 g/dL (ref 30.0–36.0)
MCHC: 31.8 g/dL (ref 30.0–36.0)
MCV: 90.1 fL (ref 78.0–100.0)
MCV: 90.2 fL (ref 78.0–100.0)
Platelets: 518 10*3/uL — ABNORMAL HIGH (ref 150–400)
Platelets: 405 10*3/uL — ABNORMAL HIGH (ref 150–400)
RBC: 3.62 MIL/uL — AB (ref 3.87–5.11)
RBC: 3.69 MIL/uL — AB (ref 3.87–5.11)
RDW: 16 % — ABNORMAL HIGH (ref 11.5–15.5)
RDW: 15.9 % — ABNORMAL HIGH (ref 11.5–15.5)
WBC: 20.6 10*3/uL — ABNORMAL HIGH (ref 4.0–10.5)
WBC: 21.4 10*3/uL — ABNORMAL HIGH (ref 4.0–10.5)

## 2016-09-30 LAB — PROTIME-INR
INR: 1.41
INR: 1.3
PROTHROMBIN TIME: 17.3 s — AB (ref 11.4–15.2)
Prothrombin Time: 16.3 seconds — ABNORMAL HIGH (ref 11.4–15.2)

## 2016-09-30 LAB — GLUCOSE, CAPILLARY
GLUCOSE-CAPILLARY: 282 mg/dL — AB (ref 65–99)
GLUCOSE-CAPILLARY: 176 mg/dL — AB (ref 65–99)
GLUCOSE-CAPILLARY: 213 mg/dL — AB (ref 65–99)
Glucose-Capillary: 239 mg/dL — ABNORMAL HIGH (ref 65–99)
Glucose-Capillary: 229 mg/dL — ABNORMAL HIGH (ref 65–99)

## 2016-09-30 NOTE — Procedures (Signed)
I was present at this dialysis session. I have reviewed the session itself and made appropriate changes.  Use 3K. UF goal 2L.  BP stable.  TDC Qb 350  Filed Weights   09/28/16 1023 09/29/16 0335 09/30/16 0418  Weight: 55.2 kg (121 lb 11.1 oz) 59.3 kg (130 lb 11.7 oz) 60.3 kg (132 lb 15 oz)     Recent Labs Lab 09/24/16 1140  09/30/16 0339  NA 132*  < > 132*  K 3.3*  < > 3.5  CL 93*  < > 96*  CO2 25  < > 23  GLUCOSE 234*  < > 238*  BUN 109*  < > 65*  CREATININE 4.70*  < > 3.79*  CALCIUM 8.7*  < > 9.5  PHOS 5.2*  --   --   < > = values in this interval not displayed.   Recent Labs Lab 09/25/16 0400 09/26/16 0718 09/28/16 1007  WBC 15.7* 13.7* 17.2*  HGB 9.3* 8.8* 9.9*  HCT 29.7* 27.9* 31.5*  MCV 90.5 90.6 90.0  PLT 388 393 399    Scheduled Meds: . amiodarone  200 mg Oral BID  . atorvastatin  20 mg Oral q1800  . chlorhexidine  15 mL Mouth Rinse BID  . [START ON 10/03/2016] darbepoetin (ARANESP) injection - DIALYSIS  100 mcg Intravenous Q Tue-HD  . famotidine  20 mg Oral Daily  . feeding supplement (PRO-STAT SUGAR FREE 64)  30 mL Per Tube TID  . Gerhardt's butt cream   Topical BID  . glycopyrrolate  1 mg Per Tube BID  . guaiFENesin  30 mL Per Tube Q6H  . insulin aspart  0-24 Units Subcutaneous Q4H  . insulin detemir  8 Units Subcutaneous BID  . mouth rinse  15 mL Mouth Rinse q12n4p  . midodrine  10 mg Oral TID WC  . multivitamin  15 mL Per Tube Daily   Continuous Infusions: . sodium chloride    . feeding supplement (NEPRO CARB STEADY) 1,000 mL (09/29/16 2111)   PRN Meds:.albuterol, benzonatate, HYDROcodone-acetaminophen, ondansetron (ZOFRAN) IV, RESOURCE THICKENUP CLEAR, sodium chloride flush   [name]  MD 09/30/2016, 8:31 AM

## 2016-09-30 NOTE — Progress Notes (Signed)
Speech Language Pathology Treatment: Dysphagia  Patient Details Name: Deborah [name] MRN: [id number] DOB: 1956/01/25 Today's Date: 09/30/2016 Time: 4076-8088 SLP Time Calculation (min) (ACUTE ONLY): 21 min  Assessment / Plan / Recommendation Clinical Impression  Pt seen per nursing request d/t intermittent coughing with meals with current diet; pt exhibited coughing when SLP arrived prior to PO intake and a delayed cough/throat clearing was noted with puree, nectar-thick and honey-thickened consistencies with usage of chin tuck and small sips/bites; pt has a strong cough, but was unable to expectorate secretions during swallowing treatment despite verbal cueing to "cough hard" and try and clear pharynx; question sensation change d/t decannulation since MBS indicated adequate airway closure with chin tuck and nectar-thickened liquids; concern for inadequate nutrition as pt is on a calorie count at present and is eating approximately 10% of meals per nursing d/t decreased appetite and multiple health concerns.  ST will continue to f/u for diet tolerance while in house and/or transferred to next level of care.   HPI HPI: Deborah [name] admitted to [facility] 08/15/16 with STEMI s/p cath to [place]. Developed cardiogenic shock, VDRF (ETT 9/14-9/29), AKI with metabolic acidosis and started on CRRT 08/17/16. 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 She also had fever and found to have Group B Strep in sputum culture.Mitral valve replacement 9/20. Swallow evaluation 9/30 with recs for NPO given poor mentation, severely impaired swallow function s/p prolonged intubation. Reintubated 9/30 secondary to septic shock; trach 10/5; new onset right hemiplegia and right gaze preference.  CT negative for acute changes; dx acute encephalopathy.       SLP Plan  Continue with current plan of care     Recommendations  Diet recommendations: Nectar-thick liquid;Dysphagia 2 (fine  chop) Liquids provided via: Straw Medication Administration: Whole meds with puree Supervision: Full supervision/cueing for compensatory strategies;Staff to assist with self feeding Compensations: Minimize environmental distractions;Slow rate;Small sips/bites;Chin tuck;Use straw to facilitate chin tuck Postural Changes and/or Swallow Maneuvers: Seated upright 90 degrees;Upright 30-60 min after meal                Oral Care Recommendations: Oral care BID Follow up Recommendations: Inpatient Rehab Plan: Continue with current plan of care                       [name],PAT, M.S., CCC-SLP 09/30/2016, 2:22 PM

## 2016-09-30 NOTE — Progress Notes (Signed)
Admit: 08/18/2016 LOS: 47  2F with AoCKD3 s/p STEMI s/p PCI and infarct of post MV papillary muscle s/p MVR 9/20  Subjective:   VVS plans for AVG next Monday For CIR after AVG In HD this AM No new evetns, doing well  10/27 0701 - 10/28 0700 In: 930 [P.O.:230; NG/GT:700] Out: -   Filed Weights   09/28/16 1023 09/29/16 0335 09/30/16 0418  Weight: 55.2 kg (121 lb 11.1 oz) 59.3 kg (130 lb 11.7 oz) 60.3 kg (132 lb 15 oz)    Scheduled Meds: . amiodarone  200 mg Oral BID  . atorvastatin  20 mg Oral q1800  . chlorhexidine  15 mL Mouth Rinse BID  . [START ON 10/03/2016] darbepoetin (ARANESP) injection - DIALYSIS  100 mcg Intravenous Q Tue-HD  . famotidine  20 mg Oral Daily  . feeding supplement (PRO-STAT SUGAR FREE 64)  30 mL Per Tube TID  . Gerhardt's butt cream   Topical BID  . glycopyrrolate  1 mg Per Tube BID  . guaiFENesin  30 mL Per Tube Q6H  . insulin aspart  0-24 Units Subcutaneous Q4H  . insulin detemir  8 Units Subcutaneous BID  . mouth rinse  15 mL Mouth Rinse q12n4p  . midodrine  10 mg Oral TID WC  . multivitamin  15 mL Per Tube Daily   Continuous Infusions: . sodium chloride    . feeding supplement (NEPRO CARB STEADY) 1,000 mL (09/29/16 2111)   PRN Meds:.albuterol, benzonatate, HYDROcodone-acetaminophen, ondansetron (ZOFRAN) IV, RESOURCE THICKENUP CLEAR, sodium chloride flush  Current Labs: reviewed    Physical Exam:  Blood pressure (!) 142/79, pulse 82, temperature 98.6 F (37 C), temperature source Oral, resp. rate 16, height 5\' 1"  (1.549 m), weight 60.3 kg (132 lb 15 oz), SpO2 100 %. NAD, in chair, awake RRR Clear ant on auscultation No edema L IJ tunneled, c/d/i, bandaged Conversant, aao  A 1. New ESRD, start RRT 9/15; on TTS schedule 1. L IJ TDC 2. SCr on addmistion at [facility] 83mo ago was 1.63 3. No sig evidence to suggest recovery of GFR thus far 4. VVS following for permanent access: AVG on 10/30 2. CAD s/p STEMI and ruptured post papllary with  severe MR s/p MVR 08/23/16 3. DM2 4. Anemia on ESA 5. VDRF s/p Trach -- decannulated successfuly  P 1. Cont on THS schedule; use 3K this AM with K 3.5 2. CLIP process started  [name] MD 09/30/2016, 8:27 AM   Recent Labs Lab 09/24/16 1140  09/28/16 0231 09/29/16 0314 09/30/16 0339  NA 132*  < > 134* 133* 132*  K 3.3*  < > 4.2 3.5 3.5  CL 93*  < > 96* 98* 96*  CO2 25  < > 27 25 23   GLUCOSE 234*  < > 138* 200* 238*  BUN 109*  < > 73* 43* 65*  CREATININE 4.70*  < > 3.72* 2.74* 3.79*  CALCIUM 8.7*  < > 9.0 9.3 9.5  PHOS 5.2*  --   --   --   --   < > = values in this interval not displayed.  Recent Labs Lab 09/25/16 0400 09/26/16 0718 09/28/16 1007  WBC 15.7* 13.7* 17.2*  HGB 9.3* 8.8* 9.9*  HCT 29.7* 27.9* 31.5*  MCV 90.5 90.6 90.0  PLT 388 393 399

## 2016-10-01 LAB — GLUCOSE, CAPILLARY
GLUCOSE-CAPILLARY: 219 mg/dL — AB (ref 65–99)
GLUCOSE-CAPILLARY: 220 mg/dL — AB (ref 65–99)
GLUCOSE-CAPILLARY: 243 mg/dL — AB (ref 65–99)
GLUCOSE-CAPILLARY: 287 mg/dL — AB (ref 65–99)
Glucose-Capillary: 196 mg/dL — ABNORMAL HIGH (ref 65–99)
Glucose-Capillary: 248 mg/dL — ABNORMAL HIGH (ref 65–99)
Glucose-Capillary: 217 mg/dL — ABNORMAL HIGH (ref 65–99)

## 2016-10-01 LAB — PROTIME-INR
INR: 1.26
Prothrombin Time: 15.8 seconds — ABNORMAL HIGH (ref 11.4–15.2)

## 2016-10-01 MED ORDER — INSULIN DETEMIR 100 UNIT/ML ~~LOC~~ SOLN
12.0000 [IU] | Freq: Two times a day (BID) | SUBCUTANEOUS | Status: DC
  Administered 2016-10-01 – 2016-10-03 (×6): 12 [IU] via SUBCUTANEOUS
  Filled 2016-10-01 (×8): qty 0.12

## 2016-10-01 NOTE — Progress Notes (Signed)
Admit: 08/18/2016 LOS: 36  33F with AoCKD3 s/p STEMI s/p PCI and infarct of post MV papillary muscle s/p MVR 9/20  Subjective:   VVS plans for AVG next Monday For CIR after AVG HD yesterday, 2L UF; post weight 58.3kg AF, BP stable, on midodrine No clear suggestion of recovering GFR  10/28 0701 - 10/29 0700 In: 1545 [P.O.:320; NG/GT:1225] Out: 2000   Filed Weights   09/30/16 0418 09/30/16 1139 10/01/16 0327  Weight: 60.3 kg (132 lb 15 oz) 58.3 kg (128 lb 8.5 oz) 59.9 kg (132 lb 0.9 oz)    Scheduled Meds: . amiodarone  200 mg Oral BID  . atorvastatin  20 mg Oral q1800  . chlorhexidine  15 mL Mouth Rinse BID  . [START ON 10/03/2016] darbepoetin (ARANESP) injection - DIALYSIS  100 mcg Intravenous Q Tue-HD  . famotidine  20 mg Oral Daily  . feeding supplement (PRO-STAT SUGAR FREE 64)  30 mL Per Tube TID  . Gerhardt's butt cream   Topical BID  . glycopyrrolate  1 mg Per Tube BID  . guaiFENesin  30 mL Per Tube Q6H  . insulin aspart  0-24 Units Subcutaneous Q4H  . insulin detemir  12 Units Subcutaneous BID  . mouth rinse  15 mL Mouth Rinse q12n4p  . midodrine  10 mg Oral TID WC  . multivitamin  15 mL Per Tube Daily   Continuous Infusions: . sodium chloride    . feeding supplement (NEPRO CARB STEADY) 1,000 mL (09/30/16 1900)   PRN Meds:.albuterol, benzonatate, HYDROcodone-acetaminophen, ondansetron (ZOFRAN) IV, RESOURCE THICKENUP CLEAR, sodium chloride flush  Current Labs: reviewed    Physical Exam:  Blood pressure 138/78, pulse 94, temperature 99.2 F (37.3 C), temperature source Oral, resp. rate (!) 29, height 5\' 1"  (1.549 m), weight 59.9 kg (132 lb 0.9 oz), SpO2 99 %. NAD, in chair, awake RRR Clear ant on auscultation No edema L IJ tunneled, c/d/i, bandaged Conversant, aao  A 1. New ESRD, start RRT 9/15; on TTS schedule 1. L IJ TDC 2. SCr on addmistion at [facility] 46mo ago was 1.63 3. No sig evidence to suggest recovery of GFR thus far 4. VVS following for permanent  access: AVG on 10/30 2. CAD s/p STEMI and ruptured post papllary with severe MR s/p MVR 08/23/16 3. DM2 4. Anemia on ESA 5. VDRF s/p Trach -- decannulated successfuly  P 1. Cont on THS schedule; use 3K this AM with K 3.5 2. CLIP process started 3. AVG tomorrow   [name] MD 10/01/2016, 11:52 AM   Recent Labs Lab 09/29/16 0314 09/30/16 0339 09/30/16 1541  NA 133* 132* 133*  K 3.5 3.5 3.3*  CL 98* 96* 97*  CO2 25 23 26   GLUCOSE 200* 238* 252*  BUN 43* 65* 20  CREATININE 2.74* 3.79* 2.05*  CALCIUM 9.3 9.5 8.9    Recent Labs Lab 09/28/16 1007 09/30/16 0819 09/30/16 1541  WBC 17.2* 20.6* 21.4*  HGB 9.9* 10.2* 10.6*  HCT 31.5* 32.6* 33.3*  MCV 90.0 90.1 90.2  PLT 399 518* 405*

## 2016-10-01 NOTE — Progress Notes (Addendum)
      [address]       [place] [address]             [phone]        31 Days Post-Op Procedure(s) (LRB): INSERTION OF DIALYSIS CATHETER LEFT INTERNAL JUGULAR VEIN & INSERTION OF TRIPLE LUMEN RIGHT INTERNAL JUGULAR VEIN (Bilateral)  Subjective: Patient continue to have complaints of cough  Objective: Vital signs in last 24 hours: Temp:  [97.7 F (36.5 C)-98.5 F (36.9 C)] 98.1 F (36.7 C) (10/29 0700) Pulse Rate:  [83-100] 94 (10/29 0700) Cardiac Rhythm: Heart block (10/29 0726) Resp:  [15-30] 29 (10/29 0700) BP: (94-144)/(62-83) 138/78 (10/29 0700) SpO2:  [97 %-100 %] 99 % (10/29 0700) Weight:  [128 lb 8.5 oz (58.3 kg)-132 lb 0.9 oz (59.9 kg)] 132 lb 0.9 oz (59.9 kg) (10/29 0327)  Pre op weight 78 kg Current Weight  10/01/16 132 lb 0.9 oz (59.9 kg)       Intake/Output from previous day: 10/28 0701 - 10/29 0700 In: 1545 [P.O.:320; NG/GT:1225] Out: 2000    Physical Exam:  Cardiovascular: RRR Pulmonary: Mostly clear Abdomen: Soft, non tender, bowel sounds present. Extremities: No lower extremity edema. Wounds: Clean and dry.  No erythema or signs of infection.  Lab Results: CBC:  Recent Labs  09/30/16 0819 09/30/16 1541  WBC 20.6* 21.4*  HGB 10.2* 10.6*  HCT 32.6* 33.3*  PLT 518* 405*   BMET:   Recent Labs  09/30/16 0339 09/30/16 1541  NA 132* 133*  K 3.5 3.3*  CL 96* 97*  CO2 23 26  GLUCOSE 238* 252*  BUN 65* 20  CREATININE 3.79* 2.05*  CALCIUM 9.5 8.9    PT/INR:  Lab Results  Component Value Date   INR 1.26 10/01/2016   INR 1.30 09/30/2016   INR 1.41 09/30/2016   ABG:  INR: Will add last result for INR, ABG once components are confirmed Will add last 4 CBG results once components are confirmed  Assessment/Plan:  1. CV - S/p STEMI, s/p PCI,s/p MV replacement. On Amiodarone 200 mg bid and Midodrine 10 mg tid. BP much improved and may be able to decrease or stop Midodrine. Plavix on hold as going to OR on  Monday. 2.  Pulmonary - On room air. Tessalon Perles and Robitussin as needed.  Encourage incentive spirometer. 3. DM-CBGs 219/220/243. On Metformin pre op. Only on Insulin post op and will increase for better glucose control. Pre op HGA1C 13.3. Will need close medical follow up after discharge. 4.  Anemia - Last H and Hstable at 10.2 and 32.6 5. ESRD-Had HD yesterday. Patient without adequate veins for AV fistula so will need AVGG. Per Dr. Trula Slade, to be done Monday.  Nephrology following as well. 6. GI-Diet is dysphagia 2. Continue with nocturnal TFs and nutrition recommendations 7. EPW still remains. Will remove tomorrow or if surgery late, Tuesday  ZIMMERMAN,DONIELLE MPA-C 10/01/2016,9:08 AM   Overall feels better For graft tomorrow I have seen and examined Deborah [name] and agree with the above assessment  and plan.  Grace Isaac MD Beeper [phone] Office [phone] 10/01/2016 10:32 AM

## 2016-10-02 ENCOUNTER — Inpatient Hospital Stay (HOSPITAL_COMMUNITY): Payer: Medicaid Other | Admitting: Anesthesiology

## 2016-10-02 ENCOUNTER — Encounter (HOSPITAL_COMMUNITY)
Admission: EM | Disposition: A | Discharge status: Rehabilitation Facility | Payer: Self-pay | Source: Other Acute Inpatient Hospital | Attending: Cardiothoracic Surgery

## 2016-10-02 HISTORY — PX: AV FISTULA PLACEMENT: SHX1204

## 2016-10-02 LAB — GLUCOSE, CAPILLARY
GLUCOSE-CAPILLARY: 290 mg/dL — AB (ref 65–99)
GLUCOSE-CAPILLARY: 157 mg/dL — AB (ref 65–99)
GLUCOSE-CAPILLARY: 137 mg/dL — AB (ref 65–99)
GLUCOSE-CAPILLARY: 178 mg/dL — AB (ref 65–99)
Glucose-Capillary: 97 mg/dL (ref 65–99)
Glucose-Capillary: 123 mg/dL — ABNORMAL HIGH (ref 65–99)

## 2016-10-02 LAB — PROTIME-INR
INR: 1.23
PROTHROMBIN TIME: 15.6 s — AB (ref 11.4–15.2)

## 2016-10-02 SURGERY — INSERTION OF ARTERIOVENOUS (AV) GORE-TEX GRAFT ARM
Anesthesia: Monitor Anesthesia Care | Site: Arm Upper | Laterality: Left

## 2016-10-02 MED ORDER — FENTANYL CITRATE (PF) 100 MCG/2ML IJ SOLN
INTRAMUSCULAR | Status: AC
  Filled 2016-10-02: qty 2

## 2016-10-02 MED ORDER — FENTANYL CITRATE (PF) 100 MCG/2ML IJ SOLN
INTRAMUSCULAR | Status: DC | PRN
  Administered 2016-10-02 (×2): 50 ug via INTRAVENOUS

## 2016-10-02 MED ORDER — LIDOCAINE-EPINEPHRINE 1 %-1:100000 IJ SOLN
INTRAMUSCULAR | Status: DC | PRN
  Administered 2016-10-02: 10 mL

## 2016-10-02 MED ORDER — LIDOCAINE-EPINEPHRINE 1 %-1:100000 IJ SOLN
INTRAMUSCULAR | Status: AC
  Filled 2016-10-02: qty 1

## 2016-10-02 MED ORDER — 0.9 % SODIUM CHLORIDE (POUR BTL) OPTIME
TOPICAL | Status: DC | PRN
  Administered 2016-10-02: 1000 mL

## 2016-10-02 MED ORDER — ONDANSETRON HCL 4 MG/2ML IJ SOLN
4.0000 mg | Freq: Once | INTRAMUSCULAR | Status: DC | PRN
  Filled 2016-10-02: qty 2

## 2016-10-02 MED ORDER — FENTANYL CITRATE (PF) 100 MCG/2ML IJ SOLN: 25.0000 ug | INTRAMUSCULAR | Status: DC | PRN

## 2016-10-02 MED ORDER — CEFAZOLIN SODIUM 1 G IJ SOLR
INTRAMUSCULAR | Status: DC | PRN
  Administered 2016-10-02: 2 g via INTRAMUSCULAR

## 2016-10-02 MED ORDER — CLOPIDOGREL BISULFATE 75 MG PO TABS
75.0000 mg | ORAL_TABLET | Freq: Every day | ORAL | Status: DC
  Administered 2016-10-03 – 2016-10-11 (×9): 75 mg via ORAL
  Filled 2016-10-02 (×9): qty 1

## 2016-10-02 MED ORDER — CEFAZOLIN SODIUM 1 G IJ SOLR
INTRAMUSCULAR | Status: AC
  Filled 2016-10-02: qty 20

## 2016-10-02 MED ORDER — PHENYLEPHRINE HCL 10 MG/ML IJ SOLN
INTRAVENOUS | Status: DC | PRN
  Administered 2016-10-02: 20 ug/min via INTRAVENOUS

## 2016-10-02 MED ORDER — SODIUM CHLORIDE 0.9 % IV SOLN
INTRAVENOUS | Status: DC | PRN
  Administered 2016-10-02: 500 mL

## 2016-10-02 MED ORDER — MIDAZOLAM HCL 5 MG/5ML IJ SOLN
INTRAMUSCULAR | Status: DC | PRN
  Administered 2016-10-02: 2 mg via INTRAVENOUS

## 2016-10-02 MED ORDER — PROPOFOL 500 MG/50ML IV EMUL
INTRAVENOUS | Status: DC | PRN
  Administered 2016-10-02: 55 ug/kg/min via INTRAVENOUS

## 2016-10-02 MED ORDER — WHITE PETROLATUM GEL
Status: AC
  Administered 2016-10-02: 19:00:00
  Filled 2016-10-02: qty 1

## 2016-10-02 MED ORDER — MIDODRINE HCL 5 MG PO TABS
10.0000 mg | ORAL_TABLET | Freq: Two times a day (BID) | ORAL | Status: DC
  Administered 2016-10-03 – 2016-10-04 (×3): 10 mg via ORAL
  Filled 2016-10-02 (×3): qty 2

## 2016-10-02 MED ORDER — MIDAZOLAM HCL 2 MG/2ML IJ SOLN
INTRAMUSCULAR | Status: AC
  Filled 2016-10-02: qty 2

## 2016-10-02 SURGICAL SUPPLY — 37 items
ARMBAND PINK RESTRICT EXTREMIT (MISCELLANEOUS) ×6 IMPLANT
CANISTER SUCTION 2500CC (MISCELLANEOUS) ×3 IMPLANT
CLIP TI MEDIUM 6 (CLIP) ×3 IMPLANT
CLIP TI WIDE RED SMALL 6 (CLIP) ×3 IMPLANT
DERMABOND ADVANCED (GAUZE/BANDAGES/DRESSINGS) ×2
DERMABOND ADVANCED .7 DNX12 (GAUZE/BANDAGES/DRESSINGS) ×1 IMPLANT
ELECT REM PT RETURN 9FT ADLT (ELECTROSURGICAL) ×3
ELECTRODE REM PT RTRN 9FT ADLT (ELECTROSURGICAL) ×1 IMPLANT
GEL ULTRASOUND 20GR AQUASONIC (MISCELLANEOUS) IMPLANT
GLOVE BIO SURGEON STRL SZ7.5 (GLOVE) ×3 IMPLANT
GLOVE BIOGEL M 6.5 STRL (GLOVE) ×3 IMPLANT
GLOVE BIOGEL PI IND STRL 6.5 (GLOVE) ×2 IMPLANT
GLOVE BIOGEL PI IND STRL 7.5 (GLOVE) ×1 IMPLANT
GLOVE BIOGEL PI INDICATOR 6.5 (GLOVE) ×4
GLOVE BIOGEL PI INDICATOR 7.5 (GLOVE) ×2
GOWN STRL REUS W/ TWL LRG LVL3 (GOWN DISPOSABLE) ×2 IMPLANT
GOWN STRL REUS W/ TWL XL LVL3 (GOWN DISPOSABLE) ×1 IMPLANT
GOWN STRL REUS W/TWL LRG LVL3 (GOWN DISPOSABLE) ×4
GOWN STRL REUS W/TWL XL LVL3 (GOWN DISPOSABLE) ×2
GRAFT GORETEX STRT 4-7X45 (Vascular Products) ×3 IMPLANT
HEMOSTAT SNOW SURGICEL 2X4 (HEMOSTASIS) IMPLANT
INSERT FOGARTY SM (MISCELLANEOUS) ×3 IMPLANT
KIT BASIN OR (CUSTOM PROCEDURE TRAY) ×3 IMPLANT
KIT ROOM TURNOVER OR (KITS) ×3 IMPLANT
NS IRRIG 1000ML POUR BTL (IV SOLUTION) ×3 IMPLANT
PACK CV ACCESS (CUSTOM PROCEDURE TRAY) ×3 IMPLANT
PAD ARMBOARD 7.5X6 YLW CONV (MISCELLANEOUS) ×6 IMPLANT
SUT GORETEX 6.0 TH-9 30 IN (SUTURE) ×3 IMPLANT
SUT GORETEX 6.0 TT13 (SUTURE) ×6 IMPLANT
SUT GORETEX CV-6TTC-13 36IN (SUTURE) ×3 IMPLANT
SUT MNCRL AB 4-0 PS2 18 (SUTURE) IMPLANT
SUT PROLENE 6 0 BV (SUTURE) IMPLANT
SUT SILK 2 0 SH (SUTURE) IMPLANT
SUT VIC AB 3-0 SH 27 (SUTURE) ×4
SUT VIC AB 3-0 SH 27X BRD (SUTURE) ×2 IMPLANT
UNDERPAD 30X30 (UNDERPADS AND DIAPERS) ×3 IMPLANT
WATER STERILE IRR 1000ML POUR (IV SOLUTION) ×3 IMPLANT

## 2016-10-02 NOTE — Anesthesia Preprocedure Evaluation (Addendum)
Anesthesia Evaluation  Patient identified by MRN, date of birth, ID band Patient awake    Reviewed: Allergy & Precautions, NPO status , Patient's Chart, lab work & pertinent test results, Unable to perform ROS - Chart review only  History of Anesthesia Complications Negative for: history of anesthetic complications  Airway Mallampati: III  TM Distance: >3 FB Neck ROM: Full    Dental  (+) Loose, Missing,    Pulmonary  VDRF s/p Trach -- decannulated successfully   breath sounds clear to auscultation       Cardiovascular hypertension, Pt. on medications (-) angina (off of IABP now)+ Past MI and +CHF   Rhythm:Regular Rate:Normal  Coronary artery disease status post STEMI and ruptured posterior papillary muscle with cardiogenic shock status post mitral valve replacement on 08/23/16  08/27/16 ECHO: EF 70%, vigorous LVF, mitral valve functioning well   Neuro/Psych negative neurological ROS  negative psych ROS   GI/Hepatic Neg liver ROS, GERD  Medicated,  Endo/Other  diabetes, Type 2, Insulin Dependent  Renal/GU ESRF and DialysisRenal disease (Tuesday/Thursday/Saturday )     Musculoskeletal negative musculoskeletal ROS (+)   Abdominal   Peds  Hematology  (+) Blood dyscrasia, anemia ,   Anesthesia Other Findings Day of surgery medications reviewed with the patient.  Reproductive/Obstetrics                            Anesthesia Physical  Anesthesia Plan  ASA: IV  Anesthesia Plan: MAC   Post-op Pain Management:    Induction: Inhalational  Airway Management Planned: Nasal Cannula  Additional Equipment:   Intra-op Plan:   Post-operative Plan:   Informed Consent: I have reviewed the patients History and Physical, chart, labs and discussed the procedure including the risks, benefits and alternatives for the proposed anesthesia with the patient or authorized representative who has indicated  his/her understanding and acceptance.   Dental advisory given  Plan Discussed with: CRNA and Surgeon  Anesthesia Plan Comments: (Discussed risks/benefits/alternatives to MAC sedation including need for ventilatory support, hypotension, need for conversion to general anesthesia.  All patient questions answered.  Patient/guardian wishes to proceed.)        Anesthesia Quick Evaluation

## 2016-10-02 NOTE — Anesthesia Procedure Notes (Signed)
Procedure Name: MAC Date/Time: 10/02/2016 1:05 PM Performed by: Trixie Deis A Pre-anesthesia Checklist: Patient identified, Emergency Drugs available, Suction available and Patient being monitored Oxygen Delivery Method: Simple face mask Placement Confirmation: positive ETCO2

## 2016-10-02 NOTE — Transfer of Care (Signed)
Immediate Anesthesia Transfer of Care Note  Patient: Deborah [name]  Procedure(s) Performed: Procedure(s): INSERTION OF ARTERIOVENOUS (AV) GORE-TEX GRAFT ARM (Left)  Patient Location: PACU  Anesthesia Type:MAC  Level of Consciousness: sedated  Airway & Oxygen Therapy: Patient Spontanous Breathing  Post-op Assessment: Report given to RN, Post -op Vital signs reviewed and stable and Patient moving all extremities  Post vital signs: Reviewed and stable  Last Vitals:  Vitals:   10/02/16 0723 10/02/16 1157  BP: 138/81 (!) 144/79  Pulse: 96 86  Resp: 20 18  Temp: 37.1 C 37.3 C    Last Pain:  Vitals:   10/02/16 1157  TempSrc: Oral  PainSc:       Patients Stated Pain Goal: 2 (74/71/85 5015)  Complications: No apparent anesthesia complications

## 2016-10-02 NOTE — Progress Notes (Addendum)
[address]       [place] [address]             [phone]      32 Days Post-Op Procedure(s) (LRB): INSERTION OF DIALYSIS CATHETER LEFT INTERNAL JUGULAR VEIN & INSERTION OF TRIPLE LUMEN RIGHT INTERNAL JUGULAR VEIN (Bilateral) Subjective: conts to slowly feel better. For Miner today  Objective: Vital signs in last 24 hours: Temp:  [98.1 F (36.7 C)-99.5 F (37.5 C)] 98.7 F (37.1 C) (10/30 0723) Pulse Rate:  [94-100] 96 (10/30 0723) Cardiac Rhythm: Heart block (10/30 0727) Resp:  [14-24] 20 (10/30 0723) BP: (118-156)/(65-86) 138/81 (10/30 0723) SpO2:  [95 %-100 %] 95 % (10/30 0723) Weight:  [134 lb 4.2 oz (60.9 kg)] 134 lb 4.2 oz (60.9 kg) (10/30 0246)  Hemodynamic parameters for last 24 hours:    Intake/Output from previous day: 10/29 0701 - 10/30 0700 In: 860.9 [P.O.:200; NG/GT:660.9] Out: 0  Intake/Output this shift: No intake/output data recorded.  General appearance: alert, cooperative, fatigued and no distress Heart: regular rate and rhythm Lungs: clear to auscultation bilaterally Abdomen: benign Extremities: no edema Wound: incis healing well  Lab Results:  Recent Labs  09/30/16 0819 09/30/16 1541  WBC 20.6* 21.4*  HGB 10.2* 10.6*  HCT 32.6* 33.3*  PLT 518* 405*   BMET:  Recent Labs  09/30/16 0339 09/30/16 1541  NA 132* 133*  K 3.5 3.3*  CL 96* 97*  CO2 23 26  GLUCOSE 238* 252*  BUN 65* 20  CREATININE 3.79* 2.05*  CALCIUM 9.5 8.9    PT/INR:  Recent Labs  10/02/16 0400  LABPROT 15.6*  INR 1.23   ABG    Component Value Date/Time   PHART 7.541 (H) 09/08/2016 0412   HCO3 25.2 09/08/2016 0412   TCO2 26 09/08/2016 0412   ACIDBASEDEF 4.1 (H) 09/02/2016 1850   O2SAT 85.8 09/22/2016 0503   CBG (last 3)   Recent Labs  10/01/16 2324 10/02/16 0254 10/02/16 0724  GLUCAP 217* 290* 157*    Meds Scheduled Meds: . amiodarone  200 mg Oral BID  . atorvastatin  20 mg Oral q1800  . chlorhexidine  15 mL Mouth  Rinse BID  . [START ON 10/03/2016] darbepoetin (ARANESP) injection - DIALYSIS  100 mcg Intravenous Q Tue-HD  . famotidine  20 mg Oral Daily  . feeding supplement (PRO-STAT SUGAR FREE 64)  30 mL Per Tube TID  . Gerhardt's butt cream   Topical BID  . glycopyrrolate  1 mg Per Tube BID  . guaiFENesin  30 mL Per Tube Q6H  . insulin aspart  0-24 Units Subcutaneous Q4H  . insulin detemir  12 Units Subcutaneous BID  . mouth rinse  15 mL Mouth Rinse q12n4p  . midodrine  10 mg Oral TID WC  . multivitamin  15 mL Per Tube Daily   Continuous Infusions: . sodium chloride    . feeding supplement (NEPRO CARB STEADY) Stopped (10/01/16 2353)   PRN Meds:.albuterol, benzonatate, HYDROcodone-acetaminophen, ondansetron (ZOFRAN) IV, RESOURCE THICKENUP CLEAR, sodium chloride flush  Xrays No results found.  Assessment/Plan: S/P Procedure(s) (LRB): INSERTION OF DIALYSIS CATHETER LEFT INTERNAL JUGULAR VEIN & INSERTION OF TRIPLE LUMEN RIGHT INTERNAL JUGULAR VEIN (Bilateral)   1 AVGG today, dialysis per nephrology 2 BP good- poss d/c midodrine soon? 3 sinus rhythm 4 INR 1.23- no other new labs 5 cont feedings as currently ordered 6 push rehab/pulm toilet as able  LOS: 45 days    [name],Deborah E 10/02/2016  Looks good after left upper arm AV graft Wean off midodrine Resume Coumadin later goal INR 2.0

## 2016-10-02 NOTE — Progress Notes (Signed)
Nutrition Follow-up  DOCUMENTATION CODES:   Not applicable  INTERVENTION:    D/C Calorie Count.  With ongoing inadequate oral intake, recommend increase nocturnal TF rate to Nepro at 70 ml/h to provide 1260 kcal (74% of minimum est needs) and 57 gm protein (63% of minimum est needs).  Continue Prostat 30 ml TID.  Add Magic Cup (each supplement provides 290 kcal and 9 grams of protein) or Mighty Shake II (each supplement provides 480-500 kcals and 20-23 grams of protein)with meals.  NUTRITION DIAGNOSIS:   Inadequate oral intake related to inability to eat as evidenced by NPO status.  Ongoing  GOAL:   Patient will meet greater than or equal to 90% of their needs  Unmet  MONITOR:   PO intake, Diet advancement, Labs, Weight trends, TF tolerance, Skin, I & O's  ASSESSMENT:   60 y/o woman with DM2, HTN, HL and noncompliance presented to [facility] ER with infrerior posterior STEMI on 9/12. Taken to cath lab and found to have occluded small to moderate OM-1 branch. Underwent successful PCS with stent x 2 by Dr. Clayborn Bigness. Coronaries otherwise ok. EF 55-60% with severe MR confirmed by echo.   S/P AV fistula placement this morning. Patient continues on a dysphagia II diet with nectar thick liquids; also receiving Prostat 30 ml PO TID.  Calorie Count completed over the weekend:  Friday (Dinner) = 170 kcal, 6 gm protein Saturday (B+L+D not eaten); Prostat = 200 kcal, 30 gm protein Sunday (B+D not eaten); Lunch = 50 kcal, 2 gm protein; Prostat =  100 kcal, 15 gm protein  PO intake is meeting </= 12% of calorie needs and </= 33% of protein needs.  Patient is currently receiving Nepro via Cortrak tube at 35 ml/h x 10 hours (8 pm to 6 am) to provide 630 kcals, 28 gm protein, 254 ml free water daily.   TF is providing 37% of kcal needs and 31% of protein needs.  Diet Order:  DIET DYS 2 Room service appropriate? Yes; Fluid consistency: Thin  Skin:  Wound (see comment) (Stage 2 to  buttocks and neck)  Last BM:  10/29  Height:   Ht Readings from Last 1 Encounters:  09/30/16 5\' 1"  (1.549 m)    Weight:   Wt Readings from Last 1 Encounters:  10/02/16 134 lb 4.2 oz (60.9 kg)    Ideal Body Weight:  47.7 kg  BMI:  Body mass index is 25.37 kg/m.  Estimated Nutritional Needs:   Kcal:  1700-1900  Protein:  90-110 grams  Fluid:  per MD  EDUCATION NEEDS:   No education needs identified at this time  Molli Barrows, [place], [employer], [place] Pager [phone] After Hours Pager [phone]

## 2016-10-02 NOTE — Progress Notes (Signed)
  Progress Note    10/02/2016 10:34 AM 32 Days Post-Op  Subjective:  No complaints today  Vitals:   10/02/16 0246 10/02/16 0723  BP: (!) 156/82 138/81  Pulse: 97 96  Resp: (!) 21 20  Temp: 98.6 F (37 C) 98.7 F (37.1 C)    Physical Exam: Awake and alert Abdomen is soft Left arm without previous incisions, palpable left radial pulse  CBC    Component Value Date/Time   WBC 21.4 (H) 09/30/2016 1541   RBC 3.69 (L) 09/30/2016 1541   HGB 10.6 (L) 09/30/2016 1541   HCT 33.3 (L) 09/30/2016 1541   PLT 405 (H) 09/30/2016 1541   MCV 90.2 09/30/2016 1541   MCH 28.7 09/30/2016 1541   MCHC 31.8 09/30/2016 1541   RDW 15.9 (H) 09/30/2016 1541   LYMPHSABS 2.3 08/23/2016 0328   MONOABS 4.1 (H) 08/23/2016 0328   EOSABS 0.5 08/23/2016 0328   BASOSABS 0.0 08/23/2016 0328    BMET    Component Value Date/Time   NA 133 (L) 09/30/2016 1541   K 3.3 (L) 09/30/2016 1541   CL 97 (L) 09/30/2016 1541   CO2 26 09/30/2016 1541   GLUCOSE 252 (H) 09/30/2016 1541   BUN 20 09/30/2016 1541   CREATININE 2.05 (H) 09/30/2016 1541   CREATININE 1.41 (H) 10/04/2012 1631   CALCIUM 8.9 09/30/2016 1541   GFRNONAA 25 (L) 09/30/2016 1541   GFRAA 29 (L) 09/30/2016 1541    INR    Component Value Date/Time   INR 1.23 10/02/2016 0400     Intake/Output Summary (Last 24 hours) at 10/02/16 1034 Last data filed at 10/02/16 0400  Gross per 24 hour  Intake           860.92 ml  Output                0 ml  Net           860.92 ml     Assessment:  60 y.o. female has kidney failure, currently dialyzes via left IJ tdc. No vein for av fistula  Plan: To OR today for left arm av graft placement Discussed risks with her and she agrees to proceed.    Dequan Kindred C. Donzetta Matters, MD [facility] Office: [phone] Pager: [phone]  10/02/2016 10:34 AM

## 2016-10-02 NOTE — Progress Notes (Addendum)
10/02/2016  10:47 AM Hemodialysis Outpatient Note; This patient has been accepted at the [facility] Dialysis center on [address], schedule not determined at this time. Thank you. Jenetta Downer.  11:00 AM Schedule will be TTS, 12:00PM and the center can begin treatment on Tuesday November 14th. Jenetta Downer.

## 2016-10-02 NOTE — Op Note (Signed)
    OPERATIVE NOTE   PROCEDURE: Left upper arm brachial-axillary graft with 4-73mm ptfe  PRE-OPERATIVE DIAGNOSIS: esrd  POST-OPERATIVE DIAGNOSIS: same  SURGEON: Johneisha Broaden C. Donzetta Matters, MD  ANESTHESIA: local and MAC  ESTIMATED BLOOD LOSS: 25 cc  FINDING(S): Suitable inflow artery with minimal disease and large ouflow axillary vein  SPECIMEN(S):  none  INDICATIONS:   Deborah [name] is a 60 y.o. female who presents with with STEMI and now has kidney failure with dialysis via left IJ tunneled catheter. She is now indicated for permanent access and has no usable vein in her upper extremities. She is therefore indicated for graft placement. We discussed the risks including bleeding infection nerve injury steal I am in and need for possible intervention in the future. She agrees and is willing to proceed.  DESCRIPTION: After full informed written consent was obtained from the patient, the patient was brought back to the operating room and placed supine upon the operating table.  She was given antibiotics and Mac anesthesia induced. Following this we anesthetized her upper arm with a total of 10 mL of 1% lidocaine with epinephrine. I then made an incision overlying the brachial artery in the above the antecubitum dissected down to found a suitable artery for inflow and circled with a vessel loop. Then turned our attention to the axilla with partial incision was made dissected down through the skin subcutaneous tissues tissue and fascia identified our axillary vein and placed vessel loop around this. The tunnel was then bluntly created between the 2 with a Gore tunneler. 4-7 tapered graft was then delivered through the 2 incisions. It was trimmed to size and sewn into side to the venous outflow in the axilla with Gore suture. Clamps were released it flushed easily. This then trimmed to size above the antecubitum and sewn end-to-side with 6-0 Prolene suture to the brachial artery. Clamps were released it had  palpable thrill and Doppler demonstrated continuous runoff in the axillary vein. Should have signal at the radial artery which had augmentation with clamping the graft. Satisfied we obtained hemostasis of the wounds closed in 2 layers with 4-0 Monocryl and Dermabond at the level of the skin. Patient is laterally from anesthesia transfer to PACU in stable condition.  COMPLICATIONS: none immediate  CONDITION: stable   Jabori Deborah C. Donzetta Matters, MD [facility] Office: [phone] Pager: [phone]  10/02/2016, 2:14 PM

## 2016-10-02 NOTE — Progress Notes (Signed)
Patient ID: Deborah [name], female   DOB: 08-20-1956, 60 y.o.   MRN: [id number]  [facility] Progress Note   Assessment/ Plan:   1. Coronary artery disease status post STEMI and ruptured posterior papillary muscle with cardiogenic shock status post mitral valve replacement on 08/23/16 2. ESRD: Continue hemodialysis on a Tuesday/Thursday/Saturday schedule at this time via left IJ TDC. Arterial venous graft placement today. Process initiated for outpatient dialysis unit placement 3. Anemia: Secondary to anemia of ESRD/critical illness-improving trend of hemoglobin noted 4. CKD-MBD: Calcium and phosphorus levels remained acceptable, recheck labs prior to next hemodialysis tomorrow 5. Nutrition: Low albumin secondary to critical illness/prolonged hospitalization, continue nutritional supplements 6. VDRF: Status post successful decannulation after prolonged wean from ventilator requiring tracheostomy-now breathing with minimal oxygen supplementation.   Subjective:   Reports some nausea overnight. Denies any chest pain or shortness of breath.   Objective:   BP 138/81 (BP Location: Right Arm)   Pulse 96   Temp 98.7 F (37.1 C) (Oral)   Resp 20   Ht 5\' 1"  (1.549 m)   Wt 60.9 kg (134 lb 4.2 oz)   SpO2 95%   BMI 25.37 kg/m   Physical Exam: WUJ:[id number] resting in bed-NG tube in situ  AOZ:HYQMV regular rhythm, S1 and S2 normal  Resp:Clear to auscultation, no rales/rhonchi  HQI:ONGE, flat, nontender  Ext:No lower extremity edema   Labs: BMET  Recent Labs Lab 09/26/16 0400 09/27/16 0232 09/28/16 0231 09/29/16 0314 09/30/16 0339 09/30/16 1541  NA 131* 137 134* 133* 132* 133*  K 3.6 3.6 4.2 3.5 3.5 3.3*  CL 93* 98* 96* 98* 96* 97*  CO2 27 27 27 25 23 26   GLUCOSE 145* 130* 138* 200* 238* 252*  BUN 76* 40* 73* 43* 65* 20  CREATININE 3.76* 2.53* 3.72* 2.74* 3.79* 2.05*  CALCIUM 8.5* 9.0 9.0 9.3 9.5 8.9   CBC  Recent Labs Lab 09/26/16 0718 09/28/16 1007  09/30/16 0819 09/30/16 1541  WBC 13.7* 17.2* 20.6* 21.4*  HGB 8.8* 9.9* 10.2* 10.6*  HCT 27.9* 31.5* 32.6* 33.3*  MCV 90.6 90.0 90.1 90.2  PLT 393 399 518* 405*   Medications:    . amiodarone  200 mg Oral BID  . atorvastatin  20 mg Oral q1800  . chlorhexidine  15 mL Mouth Rinse BID  . [START ON 10/03/2016] darbepoetin (ARANESP) injection - DIALYSIS  100 mcg Intravenous Q Tue-HD  . famotidine  20 mg Oral Daily  . feeding supplement (PRO-STAT SUGAR FREE 64)  30 mL Per Tube TID  . Gerhardt's butt cream   Topical BID  . glycopyrrolate  1 mg Per Tube BID  . guaiFENesin  30 mL Per Tube Q6H  . insulin aspart  0-24 Units Subcutaneous Q4H  . insulin detemir  12 Units Subcutaneous BID  . mouth rinse  15 mL Mouth Rinse q12n4p  . midodrine  10 mg Oral TID WC  . multivitamin  15 mL Per Tube Daily   Elmarie Shiley, MD 10/02/2016, 8:46 AM

## 2016-10-02 NOTE — Progress Notes (Signed)
SLP Cancellation Note  Patient Details Name: Deborah [name] MRN: [id number] DOB: 12-Feb-1956   Cancelled treatment:       Reason Eval/Treat Not Completed: Medical issues which prohibited therapy. Pt currently NPO pending procedure today. Will f/u as able.   Germain Osgood 10/02/2016, 9:09 AM  Germain Osgood, M.A. CCC-SLP [phone]

## 2016-10-02 NOTE — Progress Notes (Signed)
PT Cancellation Note  Patient Details Name: Deborah [name] MRN: [id number] DOB: 08-May-1956   Cancelled Treatment:    Reason Eval/Treat Not Completed: Patient declined, no reason specified;Patient at procedure or test/unavailable Pt declined as she is getting ready to go down for her [place] today. Will follow up next available time.   Deborah [name] 10/02/2016, 12:02 PM Wray Kearns, [place], DPT [phone]

## 2016-10-02 NOTE — Progress Notes (Signed)
Inpatient Diabetes Program Recommendations  AACE/ADA: New Consensus Statement on Inpatient Glycemic Control (2015)  Target Ranges:  Prepandial:   less than 140 mg/dL      Peak postprandial:   less than 180 mg/dL (1-2 hours)      Critically ill patients:  140 - 180 mg/dL   Results for MABELLE, MUNGIN (MRN [id number]) as of 10/02/2016 09:56  Ref. Range 09/30/2016 23:23 10/01/2016 03:33 10/01/2016 08:06 10/01/2016 11:25 10/01/2016 15:59 10/01/2016 19:49  Glucose-Capillary Latest Ref Range: 65 - 99 mg/dL 219 (H) 220 (H) 243 (H) 287 (H) 196 (H) 248 (H)   Results for BRAELYNNE, GARINGER (MRN [id number]) as of 10/02/2016 09:56  Ref. Range 10/01/2016 23:24 10/02/2016 02:54 10/02/2016 07:24  Glucose-Capillary Latest Ref Range: 65 - 99 mg/dL 217 (H) 290 (H) 157 (H)    Home DM Meds: Metformin 500 mg BID  Current Insulin Orders: Levemir 12 units BID      Novolog 0-24 units Q4 hours      -Note patient getting Nepro tube feeds 35 cc/hour at night from 8pm until 6am.  -Tube feeds off at midnight last PM as pt NPO for surgery today (AVG creation).  -Note Levemir increased yesterday.     MD- Once patient resumes tube feeds tonight, please consider starting Novolog tube feed coverage for the hours when pt receives continuous tube feeds:  Novolog 3 units at 8pm, 12am, 4am (give with Novolog SSI dose)    --Will follow patient during hospitalization--  Wyn Quaker RN, MSN, CDE Diabetes Coordinator Inpatient Glycemic Control Team Team Pager: [phone] (8a-5p)

## 2016-10-03 ENCOUNTER — Inpatient Hospital Stay (HOSPITAL_COMMUNITY): Payer: Medicaid Other

## 2016-10-03 ENCOUNTER — Encounter (HOSPITAL_COMMUNITY): Payer: Self-pay | Admitting: Vascular Surgery

## 2016-10-03 LAB — CBC WITH DIFFERENTIAL/PLATELET
BASOS PCT: 0 %
Basophils Absolute: 0 10*3/uL (ref 0.0–0.1)
EOS ABS: 0.2 10*3/uL (ref 0.0–0.7)
Eosinophils Relative: 2 %
HCT: 30.3 % — ABNORMAL LOW (ref 36.0–46.0)
Hemoglobin: 9.5 g/dL — ABNORMAL LOW (ref 12.0–15.0)
Lymphocytes Relative: 7 %
Lymphs Abs: 1 10*3/uL (ref 0.7–4.0)
MCH: 28.2 pg (ref 26.0–34.0)
MCHC: 31.4 g/dL (ref 30.0–36.0)
MCV: 89.9 fL (ref 78.0–100.0)
MONO ABS: 1.8 10*3/uL — AB (ref 0.1–1.0)
MONOS PCT: 14 %
Neutro Abs: 10.1 10*3/uL — ABNORMAL HIGH (ref 1.7–7.7)
Neutrophils Relative %: 77 %
Platelets: 342 10*3/uL (ref 150–400)
RBC: 3.37 MIL/uL — ABNORMAL LOW (ref 3.87–5.11)
RDW: 16.1 % — AB (ref 11.5–15.5)
WBC: 13 10*3/uL — ABNORMAL HIGH (ref 4.0–10.5)

## 2016-10-03 LAB — GLUCOSE, CAPILLARY
GLUCOSE-CAPILLARY: 219 mg/dL — AB (ref 65–99)
GLUCOSE-CAPILLARY: 165 mg/dL — AB (ref 65–99)
GLUCOSE-CAPILLARY: 95 mg/dL (ref 65–99)
GLUCOSE-CAPILLARY: 111 mg/dL — AB (ref 65–99)
Glucose-Capillary: 235 mg/dL — ABNORMAL HIGH (ref 65–99)
Glucose-Capillary: 314 mg/dL — ABNORMAL HIGH (ref 65–99)

## 2016-10-03 LAB — PROTIME-INR
INR: 1.22
PROTHROMBIN TIME: 15.5 s — AB (ref 11.4–15.2)

## 2016-10-03 MED ORDER — DARBEPOETIN ALFA 100 MCG/0.5ML IJ SOSY
PREFILLED_SYRINGE | INTRAMUSCULAR | Status: AC
  Filled 2016-10-03: qty 0.5

## 2016-10-03 MED ORDER — WARFARIN SODIUM 2 MG PO TABS
2.0000 mg | ORAL_TABLET | Freq: Every day | ORAL | Status: DC
  Administered 2016-10-03 – 2016-10-07 (×5): 2 mg via ORAL
  Filled 2016-10-03 (×5): qty 1

## 2016-10-03 MED ORDER — METFORMIN HCL 500 MG PO TABS: 500.0000 mg | ORAL_TABLET | Freq: Every day | ORAL | Status: DC

## 2016-10-03 MED ORDER — HYDROCOD POLST-CPM POLST ER 10-8 MG/5ML PO SUER
5.0000 mL | Freq: Every evening | ORAL | Status: DC | PRN
  Administered 2016-10-03 – 2016-10-09 (×3): 5 mL via ORAL
  Filled 2016-10-03 (×3): qty 5

## 2016-10-03 MED ORDER — NEPRO/CARBSTEADY PO LIQD
1000.0000 mL | ORAL | Status: DC
  Administered 2016-10-03: 1000 mL via ORAL
  Filled 2016-10-03: qty 1000

## 2016-10-03 MED ORDER — WARFARIN - PHYSICIAN DOSING INPATIENT
Freq: Every day | Status: DC
  Administered 2016-10-06 – 2016-10-08 (×2)

## 2016-10-03 MED ORDER — BENZONATATE 100 MG PO CAPS
100.0000 mg | ORAL_CAPSULE | Freq: Three times a day (TID) | ORAL | Status: DC
  Administered 2016-10-03 – 2016-10-07 (×14): 100 mg via ORAL
  Filled 2016-10-03 (×14): qty 1

## 2016-10-03 MED ORDER — NEPRO/CARBSTEADY PO LIQD
1000.0000 mL | ORAL | Status: DC
  Filled 2016-10-03 (×2): qty 1000

## 2016-10-03 NOTE — Progress Notes (Addendum)
[address]       [place] [address]             [phone]      1 Day Post-Op Procedure(s) (LRB): INSERTION OF ARTERIOVENOUS (AV) GORE-TEX GRAFT ARM (Left) Subjective: Feels poorly primarily d/t frequent cough. + productive sputum  Objective: Vital signs in last 24 hours: Temp:  [97.4 F (36.3 C)-99.5 F (37.5 C)] 97.4 F (36.3 C) (10/31 0755) Pulse Rate:  [85-97] 91 (10/31 0755) Cardiac Rhythm: Normal sinus rhythm (10/31 0755) Resp:  [18-27] 18 (10/31 0755) BP: (107-144)/(63-84) 127/84 (10/31 0755) SpO2:  [94 %-100 %] 99 % (10/31 0755) Weight:  [134 lb 0.6 oz (60.8 kg)-134 lb 11.2 oz (61.1 kg)] 134 lb 11.2 oz (61.1 kg) (10/31 0455)  Hemodynamic parameters for last 24 hours:    Intake/Output from previous day: 10/30 0701 - 10/31 0700 In: 710 [P.O.:60; I.V.:300; NG/GT:350] Out: 25 [Blood:25] Intake/Output this shift: No intake/output data recorded.  General appearance: alert, cooperative, fatigued and no distress Heart: regular rate and rhythm Lungs: slightly coarse- improves with cough Abdomen: soft, nontender Extremities: no edema Wound: incis healing well  Lab Results:  Recent Labs  09/30/16 1541  WBC 21.4*  HGB 10.6*  HCT 33.3*  PLT 405*   BMET:  Recent Labs  09/30/16 1541  NA 133*  K 3.3*  CL 97*  CO2 26  GLUCOSE 252*  BUN 20  CREATININE 2.05*  CALCIUM 8.9    PT/INR:  Recent Labs  10/03/16 0452  LABPROT 15.5*  INR 1.22   ABG    Component Value Date/Time   PHART 7.541 (H) 09/08/2016 0412   HCO3 25.2 09/08/2016 0412   TCO2 26 09/08/2016 0412   ACIDBASEDEF 4.1 (H) 09/02/2016 1850   O2SAT 85.8 09/22/2016 0503   CBG (last 3)   Recent Labs  10/02/16 2326 10/03/16 0305 10/03/16 0808  GLUCAP 178* 219* 165*    Meds Scheduled Meds: . amiodarone  200 mg Oral BID  . atorvastatin  20 mg Oral q1800  . chlorhexidine  15 mL Mouth Rinse BID  . clopidogrel  75 mg Oral Daily  . darbepoetin (ARANESP) injection  - DIALYSIS  100 mcg Intravenous Q Tue-HD  . famotidine  20 mg Oral Daily  . feeding supplement (PRO-STAT SUGAR FREE 64)  30 mL Per Tube TID  . Gerhardt's butt cream   Topical BID  . glycopyrrolate  1 mg Per Tube BID  . guaiFENesin  30 mL Per Tube Q6H  . insulin aspart  0-24 Units Subcutaneous Q4H  . insulin detemir  12 Units Subcutaneous BID  . mouth rinse  15 mL Mouth Rinse q12n4p  . midodrine  10 mg Oral BID WC  . multivitamin  15 mL Per Tube Daily   Continuous Infusions: . sodium chloride Stopped (10/02/16 1518)  . feeding supplement (NEPRO CARB STEADY) 1,000 mL (10/03/16 0500)   PRN Meds:.albuterol, benzonatate, fentaNYL (SUBLIMAZE) injection, HYDROcodone-acetaminophen, ondansetron (ZOFRAN) IV, ondansetron (ZOFRAN) IV, RESOURCE THICKENUP CLEAR, sodium chloride flush  Xrays No results found.  Assessment/Plan: S/P Procedure(s) (LRB): INSERTION OF ARTERIOVENOUS (AV) GORE-TEX GRAFT ARM (Left)  1 stable but bronchitis sx limiting as she feels fatigued and bothered with frequent cough. Tessalon dosing now TID 2 only low grade temp 3 WBC has been slowly rising- will see what todays is 4 nephrology managing renal issues 5 sugar control is fair 6 CRI if all agree  LOS: 46 days   EPW's removed per routine  technique  GOLD,WAYNE E 10/03/2016  pulmonary status has deteriorated  since last operation and will need to evaluate for aspiration pneumonia at which she is at high risk before DC to [place]

## 2016-10-03 NOTE — Anesthesia Postprocedure Evaluation (Signed)
Anesthesia Post Note  Patient: Deborah [name]  Procedure(s) Performed: Procedure(s) (LRB): INSERTION OF ARTERIOVENOUS (AV) GORE-TEX GRAFT ARM (Left)  Patient location during evaluation: PACU Anesthesia Type: MAC Level of consciousness: awake and alert Pain management: pain level controlled Vital Signs Assessment: post-procedure vital signs reviewed and stable Respiratory status: spontaneous breathing, nonlabored ventilation, respiratory function stable and patient connected to nasal cannula oxygen Cardiovascular status: stable and blood pressure returned to baseline Anesthetic complications: no    Last Vitals:  Vitals:   10/02/16 2256 10/03/16 0303  BP: 118/69 124/69  Pulse: 97 96  Resp: (!) 23 (!) 27  Temp: 37.3 C 36.8 C    Last Pain:  Vitals:   10/03/16 0303  TempSrc: Oral  PainSc:                  Catalina Gravel

## 2016-10-03 NOTE — Plan of Care (Signed)
Problem: SLP PMSV Goals Goal: Patient will demonstrate placement and removal PMSV Patient will demonstrate placement and removal of PMSV with  Outcome: Not Applicable Date Met: 72/09/47 Pt decannulated.

## 2016-10-03 NOTE — Progress Notes (Signed)
Pt's CBC results are pending.  White count has been trending up.  Pt. with bronchitis symptoms.  Await lab results to help determine pt's medical readiness for IP Rehab today (last WBC was 21.4 on 09/30/16).  I have discussed with Tomi Bamberger, RNCM.  Please call if questions.  [facility] Admissions Coordinator Cell [phone] Office [phone]

## 2016-10-03 NOTE — Progress Notes (Addendum)
Patient ID: Deborah [name], female   DOB: 12/08/1955, 60 y.o.   MRN: [id number]  [facility] Progress Note   Assessment/ Plan:   1. Coronary artery disease status post STEMI and ruptured posterior papillary muscle with cardiogenic shock status post mitral valve replacement on 08/23/16 2. ESRD: On schedule for hemodialysis today (continue Tuesday/Thursday/Saturday schedule) via tunneled dialysis catheter. She has a left upper arm AVG that was created yesterday (brachioaxillary) by Dr. Donzetta Matters and should be usable in 4 weeks. 3. Anemia: Secondary to anemia of ESRD/critical illness-improving trend of hemoglobin noted 4. CKD-MBD: Labs will be checked today at dialysis including calcium and phosphorus levels 5. Nutrition: Low albumin secondary to critical illness/prolonged hospitalization, continue nutritional supplements 6. VDRF: Status post successful decannulation after prolonged wean from ventilator requiring tracheostomy-now breathing with minimal oxygen supplementation.   Subjective:   She complains of pain over right arm/surgical sites and is bothered by cough/occasional difficulty breathing. Otherwise, appears clinically stable for transfer to CIR today.    Objective:   BP 127/84 (BP Location: Right Arm)   Pulse 91   Temp 97.4 F (36.3 C) (Oral)   Resp 18   Ht 5\' 1"  (1.549 m)   Wt 61.1 kg (134 lb 11.2 oz)   SpO2 99%   BMI 25.45 kg/m   Physical Exam: WGY:[id number] resting in bed-NG tube in situ  LTJ:QZESP regular rhythm, S1 and S2 normal  Resp:Clear to auscultation, no rales/rhonchi  QZR:AQTM, flat, nontender  Ext:No lower extremity edema. Left upper arm AVG with palpable thrill and good quality bruit  Labs: BMET  Recent Labs Lab 09/27/16 0232 09/28/16 0231 09/29/16 0314 09/30/16 0339 09/30/16 1541  NA 137 134* 133* 132* 133*  K 3.6 4.2 3.5 3.5 3.3*  CL 98* 96* 98* 96* 97*  CO2 27 27 25 23 26   GLUCOSE 130* 138* 200* 238* 252*  BUN 40* 73* 43* 65* 20   CREATININE 2.53* 3.72* 2.74* 3.79* 2.05*  CALCIUM 9.0 9.0 9.3 9.5 8.9   CBC  Recent Labs Lab 09/28/16 1007 09/30/16 0819 09/30/16 1541  WBC 17.2* 20.6* 21.4*  HGB 9.9* 10.2* 10.6*  HCT 31.5* 32.6* 33.3*  MCV 90.0 90.1 90.2  PLT 399 518* 405*   Medications:    . amiodarone  200 mg Oral BID  . atorvastatin  20 mg Oral q1800  . chlorhexidine  15 mL Mouth Rinse BID  . clopidogrel  75 mg Oral Daily  . darbepoetin (ARANESP) injection - DIALYSIS  100 mcg Intravenous Q Tue-HD  . famotidine  20 mg Oral Daily  . feeding supplement (PRO-STAT SUGAR FREE 64)  30 mL Per Tube TID  . Gerhardt's butt cream   Topical BID  . glycopyrrolate  1 mg Per Tube BID  . guaiFENesin  30 mL Per Tube Q6H  . insulin aspart  0-24 Units Subcutaneous Q4H  . insulin detemir  12 Units Subcutaneous BID  . mouth rinse  15 mL Mouth Rinse q12n4p  . midodrine  10 mg Oral BID WC  . multivitamin  15 mL Per Tube Daily   Elmarie Shiley, MD 10/03/2016, 8:28 AM

## 2016-10-03 NOTE — Progress Notes (Signed)
Speech Language Pathology Treatment: Dysphagia  Patient Details Name: Deborah [name] MRN: [id number] DOB: 01-Aug-1956 Today's Date: 10/03/2016 Time: 0569-7948 SLP Time Calculation (min) (ACUTE ONLY): 11 min  Assessment / Plan / Recommendation Clinical Impression  Pt presented as restless due to pain from continuous coughing. Her vocal quality is breathy and low intensity. She consumed minimal trials of thin liquids using a chin tuck and delayed coughing was observed. Her vocal quality remained unchanged. No further PO trials were given due to pt's visible discomfort. Given length of time since prior assessment and silent penetration noted at that time, recommend proceeding with MBS in order to assess for diet advancement. Will pursue pending possible admittance into CIR. Continue with Dys 2 and nectar thick liquids using a chin tuck until that time.   HPI HPI: 60 yo female admitted to [facility] 08/15/16 with STEMI s/p cath to [place]. Developed cardiogenic shock, VDRF (ETT 9/14-9/29), AKI with metabolic acidosis and started on CRRT 08/17/16. Transferred to [facility] to tx shock and evaluated new severe MR from papillary muscle rupture. She also had fever and found to have Group B Strep in sputum culture.Mitral valve replacement 9/20. Swallow evaluation 9/30 with recs for NPO given poor mentation, severely impaired swallow function s/p prolonged intubation. Reintubated 9/30 secondary to septic shock; trach 10/5; new onset right hemiplegia and right gaze preference.  CT negative for acute changes; dx acute encephalopathy.       SLP Plan  MBS     Recommendations  Diet recommendations: Dysphagia 2 (fine chop);Nectar-thick liquid Liquids provided via: Cup;Straw (chin tuck) Medication Administration: Whole meds with puree Supervision: Full supervision/cueing for compensatory strategies;Patient able to self feed Compensations: Minimize environmental distractions;Slow rate;Small sips/bites;Chin tuck Postural  Changes and/or Swallow Maneuvers: Upright 30-60 min after meal;Seated upright 90 degrees                Oral Care Recommendations: Oral care BID Follow up Recommendations: Inpatient Rehab Plan: [place], Student SLP  Shela Leff 10/03/2016, 10:25 AM

## 2016-10-03 NOTE — Progress Notes (Signed)
Physical Therapy Treatment Patient Details Name: Deborah [name] MRN: [id number] DOB: [birth year] Today's Date: 10/03/2016    History of Present Illness 60 yo admitted to [facility] 9/12 with STEMI develped cardiogenic shock with ETT and cRRT initiated on 9/14 after cath, transfer to [facility] 4/27, metabolic acidosis, underwent MVR 9/20, extubated 9/29, reintubated 9/30, 9/25-9/29 hemiplegia developed and resolved, trach and bronchoscopy 10/5. s/p AVG LUE 10/30. PMHx: DM, HTN, HLD    PT Comments    Patient not feeling well today. Reports soreness in LUE s/p graft and just weak overall. Continues to require Mod A for balance/safety during gait training due to poor proprioceptive awareness and difficulty correcting balance. Fatigues. Biggest complaint is increased coughing esp with mobility. Appropriate for CIR. Will follow.  Follow Up Recommendations  CIR     Equipment Recommendations  Rolling walker with 5" wheels    Recommendations for Other Services       Precautions / Restrictions Precautions Precautions: Sternal;Fall Precaution Comments: panda Restrictions Weight Bearing Restrictions: Yes (sternal precautions)    Mobility  Bed Mobility Overal bed mobility: Needs Assistance Bed Mobility: Rolling;Sidelying to Sit Rolling: Min assist Sidelying to sit: Min assist;HOB elevated       General bed mobility comments: Able to bring LEs to EOB but requires assist with trunk.   Transfers Overall transfer level: Needs assistance Equipment used: Rolling walker (2 wheeled) Transfers: Sit to/from Stand Sit to Stand: Min assist;+2 safety/equipment         General transfer comment: Cues for hand placement; posterior lean upon standing with bil knee instability; assist to steady and boost. Stood from EOB x2, Transferred to chair post ambulation.  Ambulation/Gait Ambulation/Gait assistance: Mod assist;+2 safety/equipment Ambulation Distance (Feet): 30 Feet Assistive device: Rolling walker (2  wheeled) Gait Pattern/deviations: Step-through pattern;Decreased stride length;Trunk flexed;Scissoring;Narrow base of support   Gait velocity interpretation: Below normal speed for age/gender General Gait Details: Constant Mod A for balance as pt with varying speeds, scissoring gait pattern, narrow BoS and poor proprioceptive awareness of LEs, difficulty correcting balance. Assist with RW management. Fatigues. Increased cough with mobility.    Stairs            Wheelchair Mobility    Modified Rankin (Stroke Patients Only)       Balance Overall balance assessment: Needs assistance Sitting-balance support: Feet supported;No upper extremity supported Sitting balance-Leahy Scale: Fair     Standing balance support: During functional activity Standing balance-Leahy Scale: Poor Standing balance comment: Able to stand for ~2 minutes for peri care and then again for 90 sec with Min A for balance due to anterior lean and bil knee instability.                     Cognition Arousal/Alertness: Awake/alert Behavior During Therapy: Flat affect Overall Cognitive Status: Impaired/Different from baseline         Following Commands: Follows one step commands with increased time Safety/Judgement: Decreased awareness of safety   Problem Solving: Slow processing General Comments: Pt demonstrates poor safety awareness.    Exercises      General Comments General comments (skin integrity, edema, etc.): VSS.      Pertinent Vitals/Pain Pain Assessment: Faces Faces Pain Scale: Hurts a little bit Pain Location: chesy Pain Descriptors / Indicators: Sore;Discomfort Pain Intervention(s): Monitored during session;Repositioned    Home Living                      Prior Function  PT Goals (current goals can now be found in the care plan section) Progress towards PT goals: Progressing toward goals (slowly)    Frequency    Min 3X/week      PT Plan  Current plan remains appropriate    Co-evaluation             End of Session Equipment Utilized During Treatment: Gait belt Activity Tolerance: Patient limited by fatigue;Patient tolerated treatment well Patient left: in chair;with call bell/phone within reach;with nursing/sitter in room     Time: 1006-1029 PT Time Calculation (min) (ACUTE ONLY): 23 min  Charges:  $Gait Training: 8-22 mins $Therapeutic Activity: 8-22 mins                    G Codes:      Bryttney Netzer A Mayelin Panos 10/03/2016, 10:35 AM Wray Kearns, [place], DPT [phone]

## 2016-10-03 NOTE — Plan of Care (Signed)
Problem: SLP PMSV Goals Goal: Patient will utilize compensatory strategies Patient will utilize compensatory strategies with PMSV in place to achieve functional phonation with  Outcome: Not Applicable Date Met: 84/72/07 Pt decannulated.

## 2016-10-03 NOTE — Progress Notes (Signed)
Inpatient Rehabilitation  I have spoken with Jadene Pierini, PA.  He states that Dr. Nils Pyle is not ready for pt. To transition to CIR today due to pt. not feeling well.  We will continue to follow for medical readiness, tolerance and bed availability.  I updated Tomi Bamberger, RNCM.  Please call if questions.  [facility] Admissions Coordinator Cell [phone] Office [phone]

## 2016-10-03 NOTE — Progress Notes (Addendum)
[facility] Progress Note  Subjective    Denies left hand pain or numbness.   Objective Vitals:   10/02/16 2256 10/03/16 0303  BP: 118/69 124/69  Pulse: 97 96  Resp: (!) 23 (!) 27  Temp: 99.1 F (37.3 C) 98.3 F (36.8 C)    Intake/Output Summary (Last 24 hours) at 10/03/16 0720 Last data filed at 10/03/16 0500  Gross per 24 hour  Intake              710 ml  Output               25 ml  Net              685 ml    Left upper arm graft with palpable thrill and audible bruit Left arm incisions c/d/i without hematoma.   Assessment/Planning: 60 y.o. female is s/p: left upper arm brachial-axillary graft with 4-79mm ptfe 1 Day Post-Op   Graft is patent. No steal symptoms. Ok to stick graft in 4 weeks.  F/u as needed.   Alvia Grove 10/03/2016 7:20 AM --  Laboratory CBC    Component Value Date/Time   WBC 21.4 (H) 09/30/2016 1541   HGB 10.6 (L) 09/30/2016 1541   HCT 33.3 (L) 09/30/2016 1541   PLT 405 (H) 09/30/2016 1541    BMET    Component Value Date/Time   NA 133 (L) 09/30/2016 1541   K 3.3 (L) 09/30/2016 1541   CL 97 (L) 09/30/2016 1541   CO2 26 09/30/2016 1541   GLUCOSE 252 (H) 09/30/2016 1541   BUN 20 09/30/2016 1541   CREATININE 2.05 (H) 09/30/2016 1541   CREATININE 1.41 (H) 10/04/2012 1631   CALCIUM 8.9 09/30/2016 1541   GFRNONAA 25 (L) 09/30/2016 1541   GFRAA 29 (L) 09/30/2016 1541    COAG Lab Results  Component Value Date   INR 1.22 10/03/2016   INR 1.23 10/02/2016   INR 1.26 10/01/2016   No results found for: PTT  Antibiotics Anti-infectives    Start     Dose/Rate Route Frequency Ordered Stop   09/11/16 2200  imipenem-cilastatin (PRIMAXIN) 250 mg in sodium chloride 0.9 % 100 mL IVPB     250 mg 200 mL/hr over 30 Minutes Intravenous Every 12 hours 09/11/16 1054 09/16/16 2307   09/06/16 1500  imipenem-cilastatin (PRIMAXIN) 500 mg in sodium chloride 0.9 % 100 mL IVPB  Status:  Discontinued     500 mg 200 mL/hr over  30 Minutes Intravenous Every 8 hours 09/06/16 1418 09/11/16 1054   09/04/16 1000  fluconazole (DIFLUCAN) IVPB 100 mg  Status:  Discontinued     100 mg 50 mL/hr over 60 Minutes Intravenous Every 24 hours 09/04/16 0836 09/08/16 0750   09/02/16 1400  piperacillin-tazobactam (ZOSYN) IVPB 3.375 g  Status:  Discontinued     3.375 g 100 mL/hr over 30 Minutes Intravenous Every 6 hours 09/02/16 1050 09/06/16 1418   09/02/16 1100  vancomycin (VANCOCIN) IVPB 750 mg/150 ml premix     750 mg 150 mL/hr over 60 Minutes Intravenous Every 24 hours 09/02/16 1050 09/08/16 1235   09/01/16 0800  vancomycin (VANCOCIN) IVPB 750 mg/150 ml premix  Status:  Discontinued     750 mg 150 mL/hr over 60 Minutes Intravenous Every 24 hours 08/31/16 1109 09/01/16 1256   08/29/16 0900  fluconazole (DIFLUCAN) IVPB 400 mg     400 mg 100 mL/hr over 120 Minutes Intravenous Every 24 hours 08/28/16 1045 08/30/16 1154  08/28/16 1400  piperacillin-tazobactam (ZOSYN) IVPB 3.375 g  Status:  Discontinued     3.375 g 12.5 mL/hr over 240 Minutes Intravenous Every 8 hours 08/28/16 0721 08/28/16 1042   08/28/16 1400  piperacillin-tazobactam (ZOSYN) IVPB 3.375 g     3.375 g 100 mL/hr over 30 Minutes Intravenous Every 6 hours 08/28/16 1043 09/01/16 2029   08/28/16 0900  fluconazole (DIFLUCAN) IVPB 100 mg  Status:  Discontinued     100 mg 50 mL/hr over 60 Minutes Intravenous Every 24 hours 08/28/16 0806 08/28/16 1045   08/28/16 0800  vancomycin (VANCOCIN) IVPB 1000 mg/200 mL premix  Status:  Discontinued     1,000 mg 200 mL/hr over 60 Minutes Intravenous Every 24 hours 08/28/16 0721 08/31/16 1109   08/28/16 0730  piperacillin-tazobactam (ZOSYN) IVPB 3.375 g     3.375 g 100 mL/hr over 30 Minutes Intravenous  Once 08/28/16 0721 08/28/16 0817   08/26/16 2000  cefTRIAXone (ROCEPHIN) 2 g in dextrose 5 % 50 mL IVPB    Comments:  Last dose 9/23   2 g 100 mL/hr over 30 Minutes Intravenous Every 24 hours 08/26/16 0749 08/26/16 2014    08/23/16 2200  vancomycin (VANCOCIN) 500 mg in sodium chloride 0.9 % 100 mL IVPB     500 mg 100 mL/hr over 60 Minutes Intravenous Every 24 hours 08/23/16 1752 08/24/16 2300   08/23/16 2130  vancomycin (VANCOCIN) IVPB 1000 mg/200 mL premix  Status:  Discontinued     1,000 mg 200 mL/hr over 60 Minutes Intravenous  Once 08/23/16 1553 08/23/16 1752   08/23/16 0400  vancomycin (VANCOCIN) 1,250 mg in sodium chloride 0.9 % 250 mL IVPB  Status:  Discontinued     1,250 mg 166.7 mL/hr over 90 Minutes Intravenous To Surgery 08/22/16 0834 08/22/16 1432   08/23/16 0400  cefUROXime (ZINACEF) 1.5 g in dextrose 5 % 50 mL IVPB  Status:  Discontinued     1.5 g 100 mL/hr over 30 Minutes Intravenous To Surgery 08/22/16 0834 08/22/16 1432   08/23/16 0400  cefUROXime (ZINACEF) 750 mg in dextrose 5 % 50 mL IVPB  Status:  Discontinued     750 mg 100 mL/hr over 30 Minutes Intravenous To Surgery 08/22/16 0833 08/22/16 1432   08/23/16 0400  vancomycin (VANCOCIN) 1,250 mg in sodium chloride 0.9 % 250 mL IVPB  Status:  Discontinued     1,250 mg 166.7 mL/hr over 90 Minutes Intravenous To Surgery 08/22/16 1433 08/23/16 0838   08/23/16 0400  cefUROXime (ZINACEF) 1.5 g in dextrose 5 % 50 mL IVPB  Status:  Discontinued     1.5 g 100 mL/hr over 30 Minutes Intravenous To Surgery 08/22/16 1433 08/23/16 1347   08/23/16 0400  cefUROXime (ZINACEF) 750 mg in dextrose 5 % 50 mL IVPB  Status:  Discontinued     750 mg 100 mL/hr over 30 Minutes Intravenous To Surgery 08/22/16 1432 08/23/16 0838   08/21/16 1800  cefTRIAXone (ROCEPHIN) 2 g in dextrose 5 % 50 mL IVPB  Status:  Discontinued     2 g 100 mL/hr over 30 Minutes Intravenous Every 24 hours 08/21/16 1123 08/26/16 0749   08/21/16 0600  vancomycin (VANCOCIN) IVPB 1000 mg/200 mL premix  Status:  Discontinued     1,000 mg 200 mL/hr over 60 Minutes Intravenous Every 48 hours 08/20/16 0613 08/21/16 1052   08/20/16 1000  piperacillin-tazobactam (ZOSYN) IVPB 2.25 g  Status:   Discontinued     2.25 g 100 mL/hr over 30 Minutes Intravenous Every  8 hours 08/20/16 0616 08/21/16 1052   08/19/16 1200  piperacillin-tazobactam (ZOSYN) IVPB 3.375 g  Status:  Discontinued     3.375 g 12.5 mL/hr over 240 Minutes Intravenous Every 6 hours 08/19/16 0942 08/19/16 1003   08/19/16 1015  vancomycin (VANCOCIN) IVPB 1000 mg/200 mL premix     1,000 mg 200 mL/hr over 60 Minutes Intravenous  Once 08/19/16 1013 08/19/16 1146   08/19/16 1015  piperacillin-tazobactam (ZOSYN) IVPB 3.375 g  Status:  Discontinued     3.375 g 12.5 mL/hr over 240 Minutes Intravenous Every 8 hours 08/19/16 1013 08/20/16 0615   08/19/16 0945  vancomycin (VANCOCIN) IVPB 1000 mg/200 mL premix  Status:  Discontinued    Comments:  Trough level and dose per pharmD   1,000 mg 200 mL/hr over 60 Minutes Intravenous  Once 08/19/16 0942 08/19/16 [place], PA-C [facility] Office: [phone] Pager: [phone] 10/03/2016 7:20 AM   I have independently interviewed patient and agree with PA assessment and plan above.   Zienna Ahlin C. Donzetta Matters, MD [facility] [place] Office: [phone] Pager: [phone]

## 2016-10-03 NOTE — Discharge Summary (Signed)
[address]       [place] [address]             [phone]      Physician Discharge Summary  Patient ID: Deborah [name] MRN: [id number] DOB/AGE: 1956-11-02 60 y.o.  Admit date: 08/18/2016 Discharge date: 10/06/2016  Admission Diagnoses:STEMI  Discharge Diagnoses:  Principal Problem:   Cardiogenic shock ([place]) Active Problems:   Acute on chronic diastolic heart failure ([name])   Acute pulmonary edema ([name])   Mitral regurgitation   Abnormal LFTs   S/P MVR (mitral valve replacement)   AKI (acute kidney injury) ([place])   Acute systolic congestive heart failure ([name])   PAF (paroxysmal atrial fibrillation) ([name])   Systolic congestive heart failure ([place])   HCAP (healthcare-associated pneumonia)   History of MVR with cardiopulmonary bypass   Encounter for attention to tracheostomy ([place])   Pressure injury of skin   Tracheostomy status ([place])   Delirium   Diabetes mellitus type 2 in nonobese ([place])   Stage 3 chronic kidney disease   NSTEMI (non-ST elevated myocardial infarction) ([place])   Tachypnea   Hyponatremia   Lymphocytosis   Acute blood loss anemia   Patient Active Problem List   Diagnosis Date Noted  . Diabetes mellitus type 2 in nonobese ([name])   . Stage 3 chronic kidney disease   . NSTEMI (non-ST elevated myocardial infarction) ([place])   . Tachypnea   . Hyponatremia   . Lymphocytosis   . Acute blood loss anemia   . Pressure injury of skin 09/18/2016  . Tracheostomy status ([place])   . Delirium   . Encounter for attention to tracheostomy ([place])   . History of MVR with cardiopulmonary bypass   . Systolic congestive heart failure ([place])   . HCAP (healthcare-associated pneumonia)   . PAF (paroxysmal atrial fibrillation) ([place])   . Acute systolic congestive heart failure ([place])   . AKI (acute kidney injury) ([place])   . Abnormal LFTs   . S/P MVR (mitral valve replacement)   . Mitral regurgitation 08/23/2016  . Acute on chronic diastolic heart failure  ([place]) 08/18/2016  . Acute pulmonary edema ([name])   . Respiratory failure ([place]) 08/17/2016  . Cardiogenic shock ([place]) 08/17/2016  . Acute hypoxemic respiratory failure ([place])   . STEMI (ST elevation myocardial infarction) ([place]) 08/15/2016  . Noncompliance 01/25/2015  . Bereavement 07/09/2014  . Low back pain 02/17/2014  . Cervical cancer screening 11/20/2013  . Routine general medical examination at a health care facility 10/14/2013  . Chronic kidney disease, stage 3 06/14/2012  . Hypertension 03/08/2012  . Diabetes mellitus type 2, controlled 03/08/2012  . Hyperlipidemia 03/08/2012   History of present illness:  Patient is a 60 year old female who was transferred to [facility] after presenting at [facility] on 9/12 within the inferoposterior STEMI. She is taken to the cath lab and found to have an occluded small to moderate OM branch. She underwent successful PTCA and stent times 2 by Dr. Towanda Malkin. He has her coronary arteries were felt to be okay. Ejection fraction was 55-60% and severe mitral regurgitation was confirmed by echocardiogram. During the post catheterization. She developed respiratory failure and required intubation. She developed shock and renal failure with a peak creatinine of 3.4. He try dialysis catheter was placed. The patient was persistently acidotic and concerns were for ischemic mitral regurgitation and was therefore transferred to [facility] for further management. During route to the hospital she developed severe  hypotension and norepinephrine was started.  Discharged Condition: fair to [place] Hospital Course: She was brought to the cardiac catheterization lab and a TEE was performed. This showed normal LV and RV function with ruptured posterior papillary muscle severe posterior mitral regurgitation. A Swan and intra-aortic balloon pump placed. She was seen in cardiothoracic surgical consultation by Dr.Van Trigt. She was felt to be  inadequate today for surgery however her platelets were dysfunctional by P2 Y 12 assay. She was monitored daily for medical optimization to proceed with surgery. Care was assisted by nephrology as well as critical care medicine. She was treated for significant leukocytosis and there were also pulmonary findings were felt to require Antibiotics. She'll require CRRT for significant volume overload. She remained on the ventilator for respiratory failure. She did require some inotropic support. She was overall felt to be stable for proceeding with her mitral valve surgery as described below on 08/23/2016.  Postoperative hospital course:  Postoperatively she has had a complex course. She did require reintubation for septic shock due to staph bacteremia and again required pressors support. She developed atrial flutter and underwent TEE DCCV  on 09/04/2016. She's been placed on Coumadin and amiodarone for sinus tach with PAF. She underwent a tracheostomy on 09/07/2016 and has been weaned to the ATC. Her hospital acquired pneumonia was treated and the organism was Enterobacter. She had a modified barium swallow showing silent penetration and she is on a dysphagia 2 diet with nectar liquids. She's been treated aggressively by the nephrologist and has a new left upper extremity AV graft as well as a formal dialysis catheter. She is HD dependent. She has required some midodrine  for blood pressure. This is improving. She is felt to be stable for transfer to the CIR unit for further rehabilitation. Incisions are healing well. All routine lines, monitors and drainage devices have been discontinued although she does continue to have a NG feeding tube.  Consults: cardiology, pulmonary/intensive care and nephrology  Significant Diagnostic Studies: TEE   Treatments: surgeries: DATE OF PROCEDURE:  08/23/2016 DATE OF DISCHARGE:                              OPERATIVE REPORT   OPERATION: 1. Mitral valve replacement  with a 25 mm pericardial [facility]     Ease valve serial [id number]. 2. Endoscopic harvest of left leg greater saphenous vein. 3. Placement of right subclavian vein triple-lumen catheter for IV     access.  SURGEON:  Ivin Poot, MD  ASSISTANT:  Nicholes Rough, PA-C  ANESTHESIA:  General by Midge Minium, MD  PREOPERATIVE DIAGNOSES: 1. Acute myocardial infarction, ischemic mitral regurgitation from     infarcted posterior papillary muscle, cardiogenic shock, requiring     intubation, balloon pump, and subsequent dialysis prior to surgery. 2. Single-vessel coronary artery disease of a small OM1, treated with     percutaneous coronary intervention and drug-eluting stent by Dr.     Clayborn Bigness.  POSTOPERATIVE DIAGNOSES: 1. Acute myocardial infarction, ischemic mitral regurgitation from     infarcted posterior papillary muscle, cardiogenic shock, requiring     intubation, balloon pump, and subsequent dialysis prior to surgery. 2. Single-vessel coronary artery disease of a small OM1, treated with     percutaneous coronary intervention and drug-eluting stent by Dr.     Clayborn Bigness.      NAME: Deborah [name]  MRN: [id number] DOB: 1956/11/06                                             DATE OF OPERATION: 08/31/2016  PREOP DIAGNOSIS: Chronic kidney disease  POSTOP DIAGNOSIS: Same  PROCEDURE:  1. Placement of 28 cm left IJ tunneled dialysis catheter 2. Ultrasound-guided placement of right IJ triple lumen catheter 3. Removal of right subclavian vein triple-lumen catheter  SURGEON: Judeth Cornfield. Scot Dock, MD, FACS  ASSIST: None  ANESTHESIA: Local with sedation   PROCEDURE: Left upper arm brachial-axillary graft with 4-1mm ptfe  PRE-OPERATIVE DIAGNOSIS: esrd  POST-OPERATIVE DIAGNOSIS: same  SURGEON: Brandon C. Donzetta Matters, MD  ANESTHESIA: local and MAC  ESTIMATED BLOOD LOSS: 25 cc  Discharge Exam: Blood pressure 110/64,  pulse 96, temperature 99 F (37.2 C), temperature source Axillary, resp. rate (!) 29, height 5\' 1"  (1.549 m), weight 127 lb 3.3 oz (57.7 kg), SpO2 100 %.   General appearance: alert, cooperative, fatigued and no distress Heart: regular rate and rhythm Lungs: slightly coarse- improves with cough Abdomen: soft, nontender Extremities: no edema Wound: incis healing well Disposition: 02-Transferred to [facility] INPATIENT [facility]   Current Facility-Administered Medications:  .  0.9 %  sodium chloride infusion, 250 mL, Intravenous, Continuous, Oluchi Pucci N Thereasa Iannello, PA-C, Stopped at 10/02/16 1518 .  albuterol (PROVENTIL) (2.5 MG/3ML) 0.083% nebulizer solution 2.5 mg, 2.5 mg, Nebulization, Q2H PRN, Chesley Mires, MD .  amiodarone (PACERONE) tablet 200 mg, 200 mg, Oral, BID, Amy D Clegg, NP, 200 mg at 10/06/16 0840 .  atorvastatin (LIPITOR) tablet 20 mg, 20 mg, Oral, q1800, Jolaine Artist, MD, 20 mg at 10/05/16 1819 .  benzonatate (TESSALON) capsule 100 mg, 100 mg, Oral, TID, Elmarie Shiley, MD, 100 mg at 10/06/16 0841 .  chlorhexidine (PERIDEX) 0.12 % solution 15 mL, 15 mL, Mouth Rinse, BID, Ivin Poot, MD, 15 mL at 10/06/16 0845 .  chlorpheniramine-HYDROcodone (TUSSIONEX) 10-8 MG/5ML suspension 5 mL, 5 mL, Oral, QHS PRN, Ivin Poot, MD, 5 mL at 10/03/16 2011 .  ciprofloxacin (CIPRO) tablet 500 mg, 500 mg, Oral, QPM, Ivin Poot, MD, 500 mg at 10/05/16 1819 .  clopidogrel (PLAVIX) tablet 75 mg, 75 mg, Oral, Daily, Janalyn Harder Trinh, PA-C, 75 mg at 10/06/16 0840 .  Darbepoetin Alfa (ARANESP) injection 100 mcg, 100 mcg, Intravenous, Q Tue-HD, Ivin Poot, MD, 100 mcg at 10/03/16 1451 .  diphenhydrAMINE (BENADRYL) 12.5 MG/5ML liquid 12.5 mg, 12.5 mg, Oral, Daily PRN, Elgie Collard, PA-C .  diphenhydrAMINE (BENADRYL) 12.5 MG/5ML liquid 12.5 mg, 12.5 mg, Oral, QHS PRN, Elgie Collard, PA-C, 12.5 mg at 10/05/16 2146 .  famotidine (PEPCID) 40 MG/5ML suspension 20 mg, 20 mg, Oral, Daily,  Melburn Popper, RPH, 20 mg at 10/06/16 0846 .  feeding supplement (NEPRO CARB STEADY) liquid 1,000 mL, 1,000 mL, Per Tube, Q24H, Ivin Poot, MD, Last Rate: 70 mL/hr at 10/05/16 2147, 1,000 mL at 10/05/16 2147 .  feeding supplement (PRO-STAT SUGAR FREE 64) liquid 30 mL, 30 mL, Per Tube, TID, Ivin Poot, MD, 30 mL at 10/06/16 0844 .  Gerhardt's butt cream, , Topical, BID, Ivin Poot, MD .  glycopyrrolate (ROBINUL) tablet 1 mg, 1 mg, Per Tube, BID, Chesley Mires, MD, 1 mg at 10/06/16 0840 .  guaiFENesin (ROBITUSSIN) 100 MG/5ML solution 600 mg, 30 mL, Per Tube, Q6H, Ivin Poot, MD,  600 mg at 10/06/16 1233 .  HYDROcodone-acetaminophen (HYCET) 7.5-325 mg/15 ml solution 15 mL, 15 mL, Oral, Q4H PRN, Ivin Poot, MD, 15 mL at 10/06/16 0253 .  hydrocortisone (ANUSOL-HC) 2.5 % rectal cream, , Rectal, BID, Ivin Poot, MD .  CBG monitoring, , , Q4H **AND** insulin aspart (novoLOG) injection 0-24 Units, 0-24 Units, Subcutaneous, Q4H, Ivin Poot, MD, 8 Units at 10/06/16 1129 .  insulin aspart (novoLOG) injection 5 Units, 5 Units, Subcutaneous, TID WC, Alvia Jablonski N Edvardo Honse, PA-C, 5 Units at 10/06/16 0501 .  insulin detemir (LEVEMIR) injection 20 Units, 20 Units, Subcutaneous, BID, John Giovanni, PA-C, 20 Units at 10/06/16 [phone] .  MEDLINE mouth rinse, 15 mL, Mouth Rinse, q12n4p, Ivin Poot, MD, 15 mL at 10/06/16 1200 .  midodrine (PROAMATINE) tablet 10 mg, 10 mg, Oral, q morning - 10a, Ivin Poot, MD, 10 mg at 10/06/16 0840 .  multivitamin liquid 15 mL, 15 mL, Per Tube, Daily, Fleet Contras, MD, 15 mL at 10/06/16 0845 .  ondansetron (ZOFRAN) injection 4 mg, 4 mg, Intravenous, Q6H PRN, Elgie Collard, PA-C, 4 mg at 09/30/16 1450 .  ondansetron [facility]) injection 4 mg, 4 mg, Intravenous, Once PRN, Catalina Gravel, MD .  phosphorus (K PHOS NEUTRAL) tablet 500 mg, 500 mg, Oral, BID, Elmarie Shiley, MD .  RESOURCE THICKENUP CLEAR, , Oral, PRN, Ivin Poot, MD .  sodium chloride  flush (NS) 0.9 % injection 3 mL, 3 mL, Intravenous, PRN, Elgie Collard, PA-C, 3 mL at 10/06/16 0848 .  warfarin (COUMADIN) tablet 2 mg, 2 mg, Oral, q1800, Ivin Poot, MD, 2 mg at 10/05/16 1819 .  Warfarin - Physician Dosing Inpatient, , Does not apply, q1800, Ivin Poot, MD    The patient has been discharged on:   1.Beta Blocker:  Yes [   ]                              No   [  X ]                              If No, reason: midodrine  2.Ace Inhibitor/ARB: Yes [   ]                                     No  [  x  ]                                     If No, reason: CKD, HD  3.Statin:   Yes [ x  ]                  No  [   ]                  If No, reason:  4.Ecasa:  Yes  [  ]                  No   [ x  ]                  If No, reason: on plavix and coumadin    Signed: Elgie Collard 10/06/2016, 3:03 PM

## 2016-10-03 NOTE — Progress Notes (Signed)
Patient to hemodialysis via bed.

## 2016-10-04 ENCOUNTER — Inpatient Hospital Stay (HOSPITAL_COMMUNITY): Payer: Medicaid Other

## 2016-10-04 LAB — CBC
HCT: 33.4 % — ABNORMAL LOW (ref 36.0–46.0)
Hemoglobin: 10.3 g/dL — ABNORMAL LOW (ref 12.0–15.0)
MCH: 27.8 pg (ref 26.0–34.0)
MCHC: 30.8 g/dL (ref 30.0–36.0)
MCV: 90 fL (ref 78.0–100.0)
Platelets: 320 10*3/uL (ref 150–400)
RBC: 3.71 MIL/uL — ABNORMAL LOW (ref 3.87–5.11)
RDW: 16.1 % — ABNORMAL HIGH (ref 11.5–15.5)
WBC: 14.7 10*3/uL — ABNORMAL HIGH (ref 4.0–10.5)

## 2016-10-04 LAB — GLUCOSE, CAPILLARY
GLUCOSE-CAPILLARY: 273 mg/dL — AB (ref 65–99)
GLUCOSE-CAPILLARY: 160 mg/dL — AB (ref 65–99)
GLUCOSE-CAPILLARY: 122 mg/dL — AB (ref 65–99)
Glucose-Capillary: 306 mg/dL — ABNORMAL HIGH (ref 65–99)
Glucose-Capillary: 103 mg/dL — ABNORMAL HIGH (ref 65–99)

## 2016-10-04 LAB — BASIC METABOLIC PANEL
Anion gap: 13 (ref 5–15)
BUN: 42 mg/dL — ABNORMAL HIGH (ref 6–20)
CO2: 26 mmol/L (ref 22–32)
Calcium: 8.9 mg/dL (ref 8.9–10.3)
Chloride: 96 mmol/L — ABNORMAL LOW (ref 101–111)
Creatinine, Ser: 2.6 mg/dL — ABNORMAL HIGH (ref 0.44–1.00)
GFR calc Af Amer: 22 mL/min — ABNORMAL LOW (ref >60)
GFR calc non Af Amer: 19 mL/min — ABNORMAL LOW (ref >60)
Glucose, Bld: 263 mg/dL — ABNORMAL HIGH (ref 65–99)
Potassium: 3.4 mmol/L — ABNORMAL LOW (ref 3.5–5.1)
Sodium: 135 mmol/L (ref 135–145)

## 2016-10-04 LAB — BLOOD GAS, ARTERIAL
Acid-Base Excess: 4.9 mmol/L — ABNORMAL HIGH (ref 0.0–2.0)
Bicarbonate: 28.4 mmol/L — ABNORMAL HIGH (ref 20.0–28.0)
Drawn by: 227661
FIO2: 21
O2 Saturation: 95.5 %
Patient temperature: 98.6
pCO2 arterial: 37.9 mmHg (ref 32.0–48.0)
pH, Arterial: 7.487 — ABNORMAL HIGH (ref 7.350–7.450)
pO2, Arterial: 73.4 mmHg — ABNORMAL LOW (ref 83.0–108.0)

## 2016-10-04 LAB — PROTIME-INR
INR: 1.23
PROTHROMBIN TIME: 15.5 s — AB (ref 11.4–15.2)

## 2016-10-04 MED ORDER — MIDODRINE HCL 5 MG PO TABS
10.0000 mg | ORAL_TABLET | Freq: Every morning | ORAL | Status: DC
  Administered 2016-10-05 – 2016-10-11 (×7): 10 mg via ORAL
  Filled 2016-10-04 (×7): qty 2

## 2016-10-04 MED ORDER — CIPROFLOXACIN HCL 500 MG PO TABS: 250.0000 mg | ORAL_TABLET | Freq: Two times a day (BID) | ORAL | Status: DC

## 2016-10-04 MED ORDER — CIPROFLOXACIN HCL 500 MG PO TABS
250.0000 mg | ORAL_TABLET | Freq: Two times a day (BID) | ORAL | Status: DC
Start: 2016-10-04 — End: 2016-10-05
  Administered 2016-10-04 – 2016-10-05 (×2): 250 mg via ORAL
  Filled 2016-10-04 (×2): qty 1

## 2016-10-04 MED ORDER — INSULIN DETEMIR 100 UNIT/ML ~~LOC~~ SOLN
20.0000 [IU] | Freq: Two times a day (BID) | SUBCUTANEOUS | Status: DC
  Administered 2016-10-04 – 2016-10-09 (×10): 20 [IU] via SUBCUTANEOUS
  Filled 2016-10-04 (×14): qty 0.2

## 2016-10-04 NOTE — Progress Notes (Signed)
I spoke with Johann Capers, PA with Cardiothoracic surgery regarding the potential timeline for admitting pt. to CIR.  She states pt. is not medically ready today and the hope is that pt. will be ready by the end of the week.  We are able to admit patients with Cortrack feeding tube and work closely with hemodialysis to schedule pt's therapies so it will not interfere with HD.  Gunnar Fusi will follow pt. in my absence on Thursday/Friday.  Please call if questions.  [facility] Admissions Coordinator Cell [phone] Office [phone]

## 2016-10-04 NOTE — Progress Notes (Signed)
Patient ID: Deborah [name], female   DOB: 1956/12/01, 60 y.o.   MRN: [id number]  [facility] Progress Note   Assessment/ Plan:   1. Coronary artery disease status post STEMI and ruptured posterior papillary muscle with cardiogenic shock status post mitral valve replacement on 08/23/16 2. ESRD: On schedule for hemodialysis Tuesday/Thursday/Saturday schedule via tunneled dialysis catheter. She has a left upper arm AVG that was created on 10/30 (brachioaxillary) by Dr. Donzetta Matters and should be usable in 4 weeks. Will order for hemodialysis again tomorrow 3. Anemia: Secondary to anemia of ESRD/critical illness-improving trend of hemoglobin noted 4. CKD-MBD: Labs will be checked today at dialysis including calcium and phosphorus levels 5. Nutrition: Low albumin secondary to critical illness/prolonged hospitalization, continue nutritional supplements 6. VDRF: Status post successful decannulation after prolonged wean from ventilator requiring tracheostomy-now breathing with minimal oxygen supplementation. Ongoing evaluation and management of cough  Subjective:   Reports improved pain over recent left upper arm AVG creation site. Continues to complain of cough which appears to be from bronchitis versus low-grade aspiration based on chest x-ray/additional findings. With rising leukocytosis, would have a low threshold for starting proximal spectrum antibiotic for coverage of HCHP    Objective:   BP 115/60 (BP Location: Right Arm)   Pulse (!) 101   Temp 98.6 F (37 C) (Oral)   Resp (!) 30   Ht 5\' 1"  (1.549 m)   Wt 58.9 kg (129 lb 13.6 oz)   SpO2 98%   BMI 24.54 kg/m   Physical Exam: BOF:BPZWCHENIDP resting in bed-NG tube in situ  OEU:MPNTI regular rhythm, S1 and S2 normal  Resp:Clear to auscultation, no rales/rhonchi  RWE:RXVQ, flat, nontender  Ext:No lower extremity edema. Left upper arm AVG with palpable thrill and good quality bruit  Labs: BMET  Recent Labs Lab 09/28/16 0231  09/29/16 0314 09/30/16 0339 09/30/16 1541 10/04/16 0433  NA 134* 133* 132* 133* 135  K 4.2 3.5 3.5 3.3* 3.4*  CL 96* 98* 96* 97* 96*  CO2 27 25 23 26 26   GLUCOSE 138* 200* 238* 252* 263*  BUN 73* 43* 65* 20 42*  CREATININE 3.72* 2.74* 3.79* 2.05* 2.60*  CALCIUM 9.0 9.3 9.5 8.9 8.9   CBC  Recent Labs Lab 09/30/16 0819 09/30/16 1541 10/03/16 1156 10/04/16 0433  WBC 20.6* 21.4* 13.0* 14.7*  NEUTROABS  --   --  10.1*  --   HGB 10.2* 10.6* 9.5* 10.3*  HCT 32.6* 33.3* 30.3* 33.4*  MCV 90.1 90.2 89.9 90.0  PLT 518* 405* 342 320   Medications:    . amiodarone  200 mg Oral BID  . atorvastatin  20 mg Oral q1800  . benzonatate  100 mg Oral TID  . chlorhexidine  15 mL Mouth Rinse BID  . clopidogrel  75 mg Oral Daily  . darbepoetin (ARANESP) injection - DIALYSIS  100 mcg Intravenous Q Tue-HD  . famotidine  20 mg Oral Daily  . feeding supplement (PRO-STAT SUGAR FREE 64)  30 mL Per Tube TID  . Gerhardt's butt cream   Topical BID  . glycopyrrolate  1 mg Per Tube BID  . guaiFENesin  30 mL Per Tube Q6H  . insulin aspart  0-24 Units Subcutaneous Q4H  . insulin detemir  20 Units Subcutaneous BID  . mouth rinse  15 mL Mouth Rinse q12n4p  . midodrine  10 mg Oral BID WC  . multivitamin  15 mL Per Tube Daily  . warfarin  2 mg Oral q1800  . Warfarin -  Physician Dosing Inpatient   Does not apply q1800   Elmarie Shiley, MD 10/04/2016, 9:01 AM

## 2016-10-04 NOTE — Clinical Social Work Note (Signed)
CSW continues to follow for discharge needs.  Javeria Briski, CSW [phone]  

## 2016-10-04 NOTE — Progress Notes (Signed)
Modified Barium Swallow Progress Note  Patient Details  Name: Deborah [name] MRN: [id number] Date of Birth: 09-12-1956  Today's Date: 10/04/2016  Modified Barium Swallow completed.  Full report located under Chart Review in the Imaging Section.  Brief recommendations include the following:  Clinical Impression  Pt continues to present with a mild oral and moderate pharyngeal dysphagia. She had baseline coughing prior to intake. She had mild lingual residue, reduced base of tongue retraction, and a delay in swallow initiation to the valleculae for all consistencies. Sensed aspiration occurred during thin liquid trials that was unable to be cleared despite spontaneous coughing. Adequate airway protection was seen given nectar thick liquids with and without utilizing a chin tuck. Piecemeal swallowing was viewed with minimal puree and regular solids, but airway remained protected. Coughing occurred throughout MBS with both liquids and solids, but was only indictative of aspiration on one trial of thin liquids. Given barium pill, pt needed additional solid and liquid boluses to clear tablet from the valleculae although it did not appear to clear from the esophagus with barium built up behind it (MD not present to confirm). Pt had episodes of vomiting once PO trials were complete. Recommend continuation of Dys 2 and nectar thick liquids. Will continue to follow acutely for diet tolerance.   Swallow Evaluation Recommendations       SLP Diet Recommendations: Dysphagia 2 (Fine chop) solids;Nectar thick liquid   Liquid Administration via: Cup;Straw   Medication Administration: Crushed with puree   Supervision: Patient able to self feed;Intermittent supervision to cue for compensatory strategies   Compensations: Minimize environmental distractions;Slow rate;Small sips/bites   Postural Changes: Remain semi-upright after after feeds/meals (Comment);Seated upright at 90 degrees   Oral Care  Recommendations: Oral care BID   Other Recommendations: Order thickener from pharmacy;Prohibited food (jello, ice cream, thin soups);Remove water pitcher   Ezekiel Slocumb, Student SLP  Shela Leff 10/04/2016,1:41 PM

## 2016-10-04 NOTE — Progress Notes (Addendum)
[address]       [place] [address]             [phone]      2 Days Post-Op Procedure(s) (LRB): INSERTION OF ARTERIOVENOUS (AV) GORE-TEX GRAFT ARM (Left) Subjective: Feels worse she says with persistent cough  Objective: Vital signs in last 24 hours: Temp:  [97.4 F (36.3 C)-98.6 F (37 C)] 98.6 F (37 C) (11/01 0814) Pulse Rate:  [77-101] 101 (11/01 0814) Cardiac Rhythm: Sinus tachycardia (11/01 0754) Resp:  [15-30] 30 (11/01 0814) BP: (104-143)/(54-76) 115/60 (11/01 0814) SpO2:  [96 %-100 %] 98 % (11/01 0814) Weight:  [129 lb 13.6 oz (58.9 kg)-135 lb 5.8 oz (61.4 kg)] 129 lb 13.6 oz (58.9 kg) (11/01 0500)  Hemodynamic parameters for last 24 hours:    Intake/Output from previous day: 10/31 0701 - 11/01 0700 In: 0  Out: 1500  Intake/Output this shift: Total I/O In: 1351.6 [NG/GT:1351.6] Out: -   General appearance: alert, cooperative and no distress Heart: regular rate and rhythm Lungs: coarse BS Abdomen: soft, nontender Extremities: no edema Wound: incis well healed  Lab Results:  Recent Labs  10/03/16 1156 10/04/16 0433  WBC 13.0* 14.7*  HGB 9.5* 10.3*  HCT 30.3* 33.4*  PLT 342 320   BMET:  Recent Labs  10/04/16 0433  NA 135  K 3.4*  CL 96*  CO2 26  GLUCOSE 263*  BUN 42*  CREATININE 2.60*  CALCIUM 8.9    PT/INR:  Recent Labs  10/04/16 0433  LABPROT 15.5*  INR 1.23   ABG    Component Value Date/Time   PHART 7.541 (H) 09/08/2016 0412   HCO3 25.2 09/08/2016 0412   TCO2 26 09/08/2016 0412   ACIDBASEDEF 4.1 (H) 09/02/2016 1850   O2SAT 85.8 09/22/2016 0503   CBG (last 3)   Recent Labs  10/03/16 1955 10/03/16 2337 10/04/16 0522  GLUCAP 111* 314* 273*    Meds Scheduled Meds: . amiodarone  200 mg Oral BID  . atorvastatin  20 mg Oral q1800  . benzonatate  100 mg Oral TID  . chlorhexidine  15 mL Mouth Rinse BID  . clopidogrel  75 mg Oral Daily  . darbepoetin (ARANESP) injection - DIALYSIS  100  mcg Intravenous Q Tue-HD  . famotidine  20 mg Oral Daily  . feeding supplement (PRO-STAT SUGAR FREE 64)  30 mL Per Tube TID  . Gerhardt's butt cream   Topical BID  . glycopyrrolate  1 mg Per Tube BID  . guaiFENesin  30 mL Per Tube Q6H  . insulin aspart  0-24 Units Subcutaneous Q4H  . insulin detemir  12 Units Subcutaneous BID  . mouth rinse  15 mL Mouth Rinse q12n4p  . midodrine  10 mg Oral BID WC  . multivitamin  15 mL Per Tube Daily  . warfarin  2 mg Oral q1800  . Warfarin - Physician Dosing Inpatient   Does not apply q1800   Continuous Infusions: . sodium chloride Stopped (10/02/16 1518)   PRN Meds:.albuterol, chlorpheniramine-HYDROcodone, HYDROcodone-acetaminophen, ondansetron (ZOFRAN) IV, ondansetron (ZOFRAN) IV, RESOURCE THICKENUP CLEAR, sodium chloride flush  Xrays Dg Chest Port 1 View  Result Date: 10/03/2016 CLINICAL DATA:  Shortness breath EXAM: PORTABLE CHEST 1 VIEW COMPARISON:  Chest radiograph 09/28/2016 FINDINGS: Unchanged support apparatus. Shallow lung inflation. Unchanged cardiomediastinal contours. No focal airspace consolidation or pulmonary edema. IMPRESSION: Unchanged examination without focal airspace disease or pulmonary edema. Electronically Signed   By: Cletus Gash.D.  On: 10/03/2016 18:33    Assessment/Plan: S/P Procedure(s) (LRB): INSERTION OF ARTERIOVENOUS (AV) GORE-TEX GRAFT ARM (Left)  1 stable but bronchitis sx bothersome to patient. CXR looks ok without infiltrates. She could be aspirating  As well.  2  Some leukocytosis without fevers 3 sugar control is poor- increase insulin 4 renal management per nephrology 5 night-time TF;s for now   LOS: 47 days    GOLD,WAYNE E 10/04/2016   Heavy airway secretions with rising white count History of previous Enterobacter pneumonia sensitive to Cipro Sputum cultures repeated report pending Start Cipro pending results of tracheal aspirate Tharon Aquas trigt M.D.

## 2016-10-04 NOTE — Progress Notes (Signed)
   Introduced chaplaincy services.  Will follow, as needed.  - Rev. Chaplain Salia Cangemi MDiv ThM 

## 2016-10-04 NOTE — Progress Notes (Addendum)
CARDIAC REHAB PHASE I   PRE:  Rate/Rhythm: 95 SR  BP:  Sitting: 115/67, 94/62 in hallway        SaO2: 100 RA  MODE:  Ambulation: 56 ft   POST:  Rate/Rhythm: 110 ST  BP:  Sitting: 115/75         SaO2: 98 RA  Pt required min assist to transition from lying to sitting position and to stand, however, pt did have loss of balance with standing, c/o dizziness, posterior lean present. Pt ambulated 56 ft on RA, rollator, gait belt, assist x2, very unsteady gait, posterior lean, difficulty controlling R foot movement, tolerated fair. Pt c/o cough, mild dizziness, fatigue and generalized weakness particularly in her legs, seated rest x1. Pt also orthostatic. Pt did have difficulty when turning to sit on rollator as R leg seems to drag, pt also has a tendency to sit prematurely. Recommend following with chair as opposed to rollator for safety. Pt motivated. Encouraged IS, OOB as tolerated. Pt to recliner after walk, feet elevated, call bell within reach. Will continue to follow as x2.   [id number] Lenna Sciara, RN, BSN 10/04/2016 1:38 PM

## 2016-10-05 LAB — RENAL FUNCTION PANEL
ANION GAP: 12 (ref 5–15)
Albumin: 2.7 g/dL — ABNORMAL LOW (ref 3.5–5.0)
BUN: 24 mg/dL — ABNORMAL HIGH (ref 6–20)
CALCIUM: 8.7 mg/dL — AB (ref 8.9–10.3)
CO2: 26 mmol/L (ref 22–32)
Chloride: 97 mmol/L — ABNORMAL LOW (ref 101–111)
Creatinine, Ser: 1.97 mg/dL — ABNORMAL HIGH (ref 0.44–1.00)
GFR calc non Af Amer: 27 mL/min — ABNORMAL LOW (ref >60)
GFR, EST AFRICAN AMERICAN: 31 mL/min — AB (ref >60)
Glucose, Bld: 129 mg/dL — ABNORMAL HIGH (ref 65–99)
PHOSPHORUS: 1.2 mg/dL — AB (ref 2.5–4.6)
POTASSIUM: 3.8 mmol/L (ref 3.5–5.1)
SODIUM: 135 mmol/L (ref 135–145)

## 2016-10-05 LAB — PROTIME-INR
INR: 1.32
Prothrombin Time: 16.5 seconds — ABNORMAL HIGH (ref 11.4–15.2)

## 2016-10-05 LAB — GLUCOSE, CAPILLARY
GLUCOSE-CAPILLARY: 118 mg/dL — AB (ref 65–99)
GLUCOSE-CAPILLARY: 323 mg/dL — AB (ref 65–99)
GLUCOSE-CAPILLARY: 343 mg/dL — AB (ref 65–99)
Glucose-Capillary: 301 mg/dL — ABNORMAL HIGH (ref 65–99)
Glucose-Capillary: 158 mg/dL — ABNORMAL HIGH (ref 65–99)

## 2016-10-05 LAB — CBC
HEMATOCRIT: 33.7 % — AB (ref 36.0–46.0)
HEMOGLOBIN: 10.6 g/dL — AB (ref 12.0–15.0)
MCH: 28.1 pg (ref 26.0–34.0)
MCHC: 31.5 g/dL (ref 30.0–36.0)
MCV: 89.4 fL (ref 78.0–100.0)
Platelets: 333 10*3/uL (ref 150–400)
RBC: 3.77 MIL/uL — AB (ref 3.87–5.11)
RDW: 15.8 % — ABNORMAL HIGH (ref 11.5–15.5)
WBC: 17 10*3/uL — ABNORMAL HIGH (ref 4.0–10.5)

## 2016-10-05 MED ORDER — HEPARIN SODIUM (PORCINE) 1000 UNIT/ML DIALYSIS: 20.0000 [IU]/kg | INTRAMUSCULAR | Status: DC | PRN

## 2016-10-05 MED ORDER — HEPARIN SODIUM (PORCINE) 1000 UNIT/ML DIALYSIS: 40.0000 [IU]/kg | INTRAMUSCULAR | Status: DC | PRN

## 2016-10-05 MED ORDER — DIPHENHYDRAMINE HCL 50 MG/ML IJ SOLN
INTRAMUSCULAR | Status: DC
Start: 2016-10-05 — End: 2016-10-05
  Filled 2016-10-05: qty 1

## 2016-10-05 MED ORDER — DIPHENHYDRAMINE HCL 12.5 MG/5ML PO LIQD
12.5000 mg | Freq: Every day | ORAL | Status: DC | PRN
  Administered 2016-10-07: 12.5 mg via ORAL
  Filled 2016-10-05 (×2): qty 5

## 2016-10-05 MED ORDER — HYDROCORTISONE 2.5 % RE CREA
TOPICAL_CREAM | Freq: Two times a day (BID) | RECTAL | Status: DC
  Administered 2016-10-05 – 2016-10-11 (×9): via RECTAL
  Filled 2016-10-05 (×2): qty 28.35

## 2016-10-05 MED ORDER — INSULIN ASPART 100 UNIT/ML ~~LOC~~ SOLN
5.0000 [IU] | Freq: Three times a day (TID) | SUBCUTANEOUS | Status: DC
  Administered 2016-10-05 – 2016-10-08 (×7): 5 [IU] via SUBCUTANEOUS

## 2016-10-05 MED ORDER — DIPHENHYDRAMINE HCL 25 MG PO CAPS: 25.0000 mg | ORAL_CAPSULE | Freq: Four times a day (QID) | ORAL | Status: DC | PRN

## 2016-10-05 MED ORDER — DIPHENHYDRAMINE HCL 12.5 MG/5ML PO ELIX
12.5000 mg | ORAL_SOLUTION | Freq: Every evening | ORAL | Status: DC | PRN
  Administered 2016-10-05 – 2016-10-10 (×3): 12.5 mg via ORAL
  Filled 2016-10-05 (×7): qty 5

## 2016-10-05 MED ORDER — CIPROFLOXACIN HCL 500 MG PO TABS
500.0000 mg | ORAL_TABLET | Freq: Every evening | ORAL | Status: DC
  Administered 2016-10-05 – 2016-10-08 (×4): 500 mg via ORAL
  Filled 2016-10-05 (×5): qty 1

## 2016-10-05 MED ORDER — NEPRO/CARBSTEADY PO LIQD
1000.0000 mL | ORAL | Status: DC
  Administered 2016-10-05 – 2016-10-07 (×3): 1000 mL
  Filled 2016-10-05 (×7): qty 1000

## 2016-10-05 NOTE — Progress Notes (Signed)
SLP Cancellation Note  Patient Details Name: ZAMORAH AILES MRN: [id number] DOB: 26-Jan-1956   Cancelled treatment:       Reason Eval/Treat Not Completed: Patient at procedure or test/unavailable. Pt at dialysis.   Ezekiel Slocumb, Student SLP  Shela Leff 10/05/2016, 11:20 AM

## 2016-10-05 NOTE — Progress Notes (Addendum)
      [address]       [place] [address]             [phone]      3 Days Post-Op Procedure(s) (LRB): INSERTION OF ARTERIOVENOUS (AV) GORE-TEX GRAFT ARM (Left) Subjective: Shares she is having anxiety attacks at night where she has palpitations and sweats. Cough slightly improved.  Objective: Vital signs in last 24 hours: Temp:  [97.9 F (36.6 C)-98.6 F (37 C)] 98.3 F (36.8 C) (11/02 0327) Pulse Rate:  [93-106] 96 (11/02 0800) Cardiac Rhythm: Normal sinus rhythm (11/02 0800) Resp:  [17-28] 19 (11/02 0800) BP: (87-133)/(54-75) 133/69 (11/02 0327) SpO2:  [96 %-100 %] 98 % (11/02 0800) Weight:  [140 lb 14 oz (63.9 kg)] 140 lb 14 oz (63.9 kg) (11/02 0500)     Intake/Output from previous day: 11/01 0701 - 11/02 0700 In: 1444.4 [I.V.:50; NG/GT:1394.4] Out: 1 [Stool:1] Intake/Output this shift: No intake/output data recorded.  General appearance: alert, cooperative and no distress Heart: regular rate and rhythm Lungs: clear to auscultation bilaterally Abdomen: decreased bowel sounds Extremities: extremities normal, atraumatic, no cyanosis or edema Wound: clean and dry  Lab Results:  Recent Labs  10/03/16 1156 10/04/16 0433  WBC 13.0* 14.7*  HGB 9.5* 10.3*  HCT 30.3* 33.4*  PLT 342 320   BMET:  Recent Labs  10/04/16 0433  NA 135  K 3.4*  CL 96*  CO2 26  GLUCOSE 263*  BUN 42*  CREATININE 2.60*  CALCIUM 8.9    PT/INR:  Recent Labs  10/05/16 0327  LABPROT 16.5*  INR 1.32   ABG    Component Value Date/Time   PHART 7.487 (H) 10/04/2016 1430   HCO3 28.4 (H) 10/04/2016 1430   TCO2 26 09/08/2016 0412   ACIDBASEDEF 4.1 (H) 09/02/2016 1850   O2SAT 95.5 10/04/2016 1430   CBG (last 3)   Recent Labs  10/04/16 2019 10/04/16 2332 10/05/16 0326  GLUCAP 122* 343* 301*    Assessment/Plan: S/P Procedure(s) (LRB): INSERTION OF ARTERIOVENOUS (AV) GORE-TEX GRAFT ARM (Left)  1. Sputum cultures pending. On Cipro Po. No fevers.  WBC up slightly 2. Sugar poorly controlled at night when tube feedings taking place. Blood sugar in the 300s. Will place a diabetic educator consult for better insulin coverage during her night feedings. On Metformin at home.  3. Renal management per nephrology 4. Night tube feedings, dietary following 5. Anxiety attacks-requesting medication. Not on anything at home. Also having insomnia. Will order PRN Benadryl for insomnia and anxiety if okay with renal 6. HR has been NSR 90s-100s, On midodrine for blood pressure support. Amio Po. On coumadin 2mg  daily. INR 1.32     LOS: 48 days       Elgie Collard 10/05/2016 Agree with above note Ready for CIR tomorrow

## 2016-10-05 NOTE — Progress Notes (Signed)
OT Cancellation Note  Patient Details Name: Deborah [name] MRN: [id number] DOB: [birth year]   Cancelled Treatment:    Reason Eval/Treat Not Completed: Patient at procedure or test/ unavailable.   Pt in HD.  Will reattempt.  [place], OTR/L [phone]   Lucille Passy M 10/05/2016, 11:57 AM

## 2016-10-05 NOTE — Progress Notes (Signed)
Physical Therapy Treatment Patient Details Name: Deborah [name] MRN: [id number] DOB: 23-Nov-1956 Today's Date: 10/05/2016    History of Present Illness 60 yo admitted to [facility] 9/12 with STEMI develped cardiogenic shock with ETT and cRRT initiated on 9/14 after cath, transfer to [facility] 7/86, metabolic acidosis, underwent MVR 9/20, extubated 9/29, reintubated 9/30, 9/25-9/29 hemiplegia developed and resolved, trach and bronchoscopy 10/5. s/p AVG LUE 10/30. PMHx: DM, HTN, HLD    PT Comments    Patient progressing this session to ambulating with one assist (though safer given impulsivity to sit with having second person follow with chair,) but also increased distance with ambulation.  Feel she is appropriate for CIR level rehab.  Was somewhat panicky with SOB initially ambulating, but improved with seated rest and SpO2 remained WNL.  Follow Up Recommendations  CIR     Equipment Recommendations  Rolling walker with 5" wheels    Recommendations for Other Services       Precautions / Restrictions Precautions Precautions: Sternal;Fall Precaution Comments: panda    Mobility  Bed Mobility       Sidelying to sit: Supervision;HOB elevated   Sit to supine: Min guard   General bed mobility comments: use of rails to come upright; assist for positioning once supine  Transfers Overall transfer level: Needs assistance Equipment used: Rolling walker (2 wheeled) Transfers: Sit to/from Stand Sit to Stand: Mod assist;Max assist         General transfer comment: lifting assist from EOB with pt pulling up on walker; noted also assist needed to lower to seat after almost fell sitting on chair in hallway to rest and not backed up close enough to edge  Ambulation/Gait Ambulation/Gait assistance: Mod assist Ambulation Distance (Feet): 60 Feet (x 2) Assistive device: Rolling walker (2 wheeled) Gait Pattern/deviations: Step-through pattern;Ataxic;Drifts right/left;Scissoring;Decreased stride  length;Shuffle     General Gait Details: cues for heel strike, assist for walker management, assist for balance as at times leaning R and posterior, facilitation at anterior trunk for flexion; seated rest needed in hallway with RR up to 40, improved with rest and calming techniques to 20   Stairs            Wheelchair Mobility    Modified Rankin (Stroke Patients Only)       Balance Overall balance assessment: Needs assistance   Sitting balance-Leahy Scale: Fair     Standing balance support: Bilateral upper extremity supported Standing balance-Leahy Scale: Poor Standing balance comment: static standing min A with UE support                    Cognition Arousal/Alertness: Awake/alert Behavior During Therapy: Flat affect Overall Cognitive Status: Impaired/Different from baseline Area of Impairment: Safety/judgement;Following commands;Problem solving       Following Commands: Follows one step commands with increased time Safety/Judgement: Decreased awareness of safety   Problem Solving: Slow processing General Comments: Pt demonstrates poor safety awareness.    Exercises      General Comments General comments (skin integrity, edema, etc.): initially soiled in bed, assist for hygiene and changed brief      Pertinent Vitals/Pain Faces Pain Scale: Hurts a little bit Pain Location: with coughing Pain Descriptors / Indicators: Discomfort Pain Intervention(s): Monitored during session    Home Living                      Prior Function            PT Goals (current goals  can now be found in the care plan section) Acute Rehab PT Goals Time For Goal Achievement: 10/19/16 Potential to Achieve Goals: Fair Progress towards PT goals: Progressing toward goals;Goals met and updated - see care plan    Frequency    Min 3X/week      PT Plan Current plan remains appropriate    Co-evaluation             End of Session Equipment Utilized  During Treatment: Gait belt Activity Tolerance: Patient limited by fatigue Patient left: in bed;with call bell/phone within reach;Other (comment) (transporter to take her to HD)     Time: 1007-1035 PT Time Calculation (min) (ACUTE ONLY): 28 min  Charges:  $Gait Training: 23-37 mins                    G Codes:      Deborah [name] 10/19/16, 1:55 PM  [name], [place] Oct 19, 2016

## 2016-10-05 NOTE — Progress Notes (Signed)
Inpatient Rehabilitation  Continuing to follow along for timing of medical readiness prior to IP Rehab admission.  I await acute and rehab MD clearance prior to admission.  Please call with questions.  Carmelia Roller., CCC/SLP Admission Coordinator  [place]  Cell [phone]

## 2016-10-05 NOTE — Procedures (Signed)
Patient seen on Hemodialysis. QB 400, UF goal 2L Treatment adjusted as needed.  Elmarie Shiley MD [facility]. Office # [phone] Pager # [phone] 11:22 AM

## 2016-10-05 NOTE — Progress Notes (Signed)
Inpatient Diabetes Program Recommendations  AACE/ADA: New Consensus Statement on Inpatient Glycemic Control (2015)  Target Ranges:  Prepandial:   less than 140 mg/dL      Peak postprandial:   less than 180 mg/dL (1-2 hours)      Critically ill patients:  140 - 180 mg/dL   Results for Deborah [name], Deborah [name] (MRN [id number]) as of 10/05/2016 09:43  Ref. Range 10/04/2016 05:22 10/04/2016 08:13 10/04/2016 12:37 10/04/2016 17:16 10/04/2016 20:19 10/04/2016 23:32 10/05/2016 03:26  Glucose-Capillary Latest Ref Range: 65 - 99 mg/dL 273 (H) 306 (H) 103 (H) 160 (H) 122 (H) 343 (H) 301 (H)   Review of Glycemic Control  Current orders for Inpatient glycemic control:  Levemir 20 units BID, Novolog 0-24 units Q24H  Inpatient Diabetes Program Recommendations: Insulin - Meal Coverage: Note glucose elevated throughout the night with tube feedings. Please consider ordering Novolog 5 units TID (at 8pm, 12am, and 4 am; in addition to Novolog correction scale).  Thanks, Barnie Alderman, RN, MSN, CDE Diabetes Coordinator Inpatient Diabetes Program [phone] (Team Pager from 8am to 5pm)

## 2016-10-05 NOTE — Progress Notes (Addendum)
Patient ID: Deborah [name], female   DOB: 1956-02-17, 60 y.o.   MRN: [id number]  [facility] Progress Note   Assessment/ Plan:   1. Coronary artery disease status post STEMI and ruptured posterior papillary muscle with cardiogenic shock status post mitral valve replacement on 08/23/16 2. ESRD: On schedule for hemodialysis Tuesday/Thursday/Saturday schedule via tunneled dialysis catheter. She has a left upper arm AVG that was created on 10/30 (brachioaxillary) by Dr. Donzetta Matters and should be usable in 4 weeks. She is on schedule for hemodialysis today. 3. Anemia: Secondary to anemia of ESRD/critical illness-improving trend of hemoglobin noted 4. CKD-MBD: Labs will be checked today at dialysis including calcium and phosphorus levels 5. Nutrition: Low albumin secondary to critical illness/prolonged hospitalization, continue nutritional supplements 6. Cough/intermittent dyspnea: Started on empiric antibiotic therapy with ciprofloxacin as respiratory cultures are awaited. Previously dependent on ventilator for a prolonged period and transitioned over to tracheostomy that was decannulated last week. She continues to have moderate oropharyngeal dysphasia and is on a modified dysphagia 2/nectar thick liquids diet.  Subjective:   She continues to have intermittent cough and shortness of breath with anxiety attacks at night    Objective:   BP 124/67 (BP Location: Right Arm)   Pulse 96   Temp 98.4 F (36.9 C) (Oral)   Resp 19   Ht 5\' 1"  (1.549 m)   Wt 63.9 kg (140 lb 14 oz)   SpO2 98%   BMI 26.62 kg/m   Physical Exam: TDV:VOHYWVPXTGG resting in bed-NG tube in situ  YIR:SWNIO regular rhythm, S1 and S2 normal  Resp:Clear to auscultation, no rales/rhonchi  EVO:JJKK, flat, nontender  Ext:No lower extremity edema. Left upper arm AVG with palpable thrill and good quality bruit  Labs: BMET  Recent Labs Lab 09/29/16 0314 09/30/16 0339 09/30/16 1541 10/04/16 0433  NA 133* 132* 133* 135   K 3.5 3.5 3.3* 3.4*  CL 98* 96* 97* 96*  CO2 25 23 26 26   GLUCOSE 200* 238* 252* 263*  BUN 43* 65* 20 42*  CREATININE 2.74* 3.79* 2.05* 2.60*  CALCIUM 9.3 9.5 8.9 8.9   CBC  Recent Labs Lab 09/30/16 0819 09/30/16 1541 10/03/16 1156 10/04/16 0433  WBC 20.6* 21.4* 13.0* 14.7*  NEUTROABS  --   --  10.1*  --   HGB 10.2* 10.6* 9.5* 10.3*  HCT 32.6* 33.3* 30.3* 33.4*  MCV 90.1 90.2 89.9 90.0  PLT 518* 405* 342 320   Medications:    . amiodarone  200 mg Oral BID  . atorvastatin  20 mg Oral q1800  . benzonatate  100 mg Oral TID  . chlorhexidine  15 mL Mouth Rinse BID  . ciprofloxacin  250 mg Oral BID  . clopidogrel  75 mg Oral Daily  . darbepoetin (ARANESP) injection - DIALYSIS  100 mcg Intravenous Q Tue-HD  . famotidine  20 mg Oral Daily  . feeding supplement (PRO-STAT SUGAR FREE 64)  30 mL Per Tube TID  . Gerhardt's butt cream   Topical BID  . glycopyrrolate  1 mg Per Tube BID  . guaiFENesin  30 mL Per Tube Q6H  . insulin aspart  0-24 Units Subcutaneous Q4H  . insulin detemir  20 Units Subcutaneous BID  . mouth rinse  15 mL Mouth Rinse q12n4p  . midodrine  10 mg Oral q morning - 10a  . multivitamin  15 mL Per Tube Daily  . warfarin  2 mg Oral q1800  . Warfarin - Physician Dosing Inpatient   Does  not apply q1800   Elmarie Shiley, MD 10/05/2016, 9:03 AM

## 2016-10-06 LAB — GLUCOSE, CAPILLARY
GLUCOSE-CAPILLARY: 209 mg/dL — AB (ref 65–99)
GLUCOSE-CAPILLARY: 238 mg/dL — AB (ref 65–99)
GLUCOSE-CAPILLARY: 247 mg/dL — AB (ref 65–99)
Glucose-Capillary: 210 mg/dL — ABNORMAL HIGH (ref 65–99)
Glucose-Capillary: 253 mg/dL — ABNORMAL HIGH (ref 65–99)
Glucose-Capillary: 283 mg/dL — ABNORMAL HIGH (ref 65–99)
Glucose-Capillary: 405 mg/dL — ABNORMAL HIGH (ref 65–99)

## 2016-10-06 LAB — PROTIME-INR
INR: 1.57
PROTHROMBIN TIME: 18.9 s — AB (ref 11.4–15.2)

## 2016-10-06 MED ORDER — NEPRO/CARBSTEADY PO LIQD: 1000.0000 mL | ORAL | 0 refills | Status: DC

## 2016-10-06 MED ORDER — RESOURCE THICKENUP CLEAR PO POWD: ORAL | 10 refills | Status: DC

## 2016-10-06 MED ORDER — K PHOS MONO-SOD PHOS DI & MONO 155-852-130 MG PO TABS
500.0000 mg | ORAL_TABLET | Freq: Two times a day (BID) | ORAL | Status: DC
  Administered 2016-10-06 – 2016-10-07 (×2): 500 mg via ORAL
  Filled 2016-10-06 (×3): qty 2

## 2016-10-06 MED ORDER — GERHARDT'S BUTT CREAM: 1.0000 "application " | TOPICAL_CREAM | Freq: Two times a day (BID) | CUTANEOUS | 0 refills | Status: DC

## 2016-10-06 MED ORDER — FAMOTIDINE 40 MG/5ML PO SUSR: 20.0000 mg | Freq: Every day | ORAL | 0 refills | Status: DC

## 2016-10-06 MED ORDER — DIPHENHYDRAMINE HCL 12.5 MG/5ML PO LIQD: 12.5000 mg | Freq: Every evening | ORAL | 0 refills | Status: DC | PRN

## 2016-10-06 MED ORDER — ORAL CARE MOUTH RINSE: 15.0000 mL | Freq: Two times a day (BID) | OROMUCOSAL | 0 refills | Status: DC

## 2016-10-06 MED ORDER — AMIODARONE HCL 200 MG PO TABS: 200.0000 mg | ORAL_TABLET | Freq: Two times a day (BID) | ORAL | 1 refills | Status: DC

## 2016-10-06 MED ORDER — GUAIFENESIN 100 MG/5ML PO SOLN: 30.0000 mL | Freq: Four times a day (QID) | ORAL | 0 refills | Status: DC

## 2016-10-06 MED ORDER — GLYCOPYRROLATE 1 MG PO TABS: 1.0000 mg | ORAL_TABLET | Freq: Two times a day (BID) | ORAL | 0 refills | Status: DC

## 2016-10-06 MED ORDER — K PHOS MONO-SOD PHOS DI & MONO 155-852-130 MG PO TABS: 500.0000 mg | ORAL_TABLET | Freq: Two times a day (BID) | ORAL | 0 refills | Status: DC

## 2016-10-06 MED ORDER — ADULT MULTIVITAMIN LIQUID CH: 15.0000 mL | Freq: Every day | ORAL | 0 refills | Status: DC

## 2016-10-06 MED ORDER — INSULIN DETEMIR 100 UNIT/ML ~~LOC~~ SOLN: 20.0000 [IU] | Freq: Two times a day (BID) | SUBCUTANEOUS | 11 refills | Status: DC

## 2016-10-06 MED ORDER — HYDROCORTISONE 2.5 % RE CREA: TOPICAL_CREAM | Freq: Two times a day (BID) | RECTAL | 0 refills | Status: DC

## 2016-10-06 MED ORDER — DARBEPOETIN ALFA 100 MCG/0.5ML IJ SOSY: 100.0000 ug | PREFILLED_SYRINGE | INTRAMUSCULAR | 10 refills | Status: DC

## 2016-10-06 MED ORDER — ALBUTEROL SULFATE (2.5 MG/3ML) 0.083% IN NEBU: 2.5000 mg | INHALATION_SOLUTION | RESPIRATORY_TRACT | 12 refills | Status: DC | PRN

## 2016-10-06 MED ORDER — HYDROCOD POLST-CPM POLST ER 10-8 MG/5ML PO SUER: 5.0000 mL | Freq: Every evening | ORAL | 0 refills | Status: DC | PRN

## 2016-10-06 MED ORDER — INSULIN ASPART 100 UNIT/ML ~~LOC~~ SOLN
5.0000 [IU] | Freq: Three times a day (TID) | SUBCUTANEOUS | 11 refills | Status: DC
Start: 2016-10-06 — End: 2016-10-11

## 2016-10-06 MED ORDER — DIPHENHYDRAMINE HCL 12.5 MG/5ML PO LIQD: 12.5000 mg | Freq: Every day | ORAL | 0 refills | Status: DC | PRN

## 2016-10-06 MED ORDER — PRO-STAT SUGAR FREE PO LIQD
30.0000 mL | Freq: Three times a day (TID) | ORAL | 0 refills | Status: DC
Start: 2016-10-06 — End: 2016-10-11

## 2016-10-06 MED ORDER — MIDODRINE HCL 10 MG PO TABS: 10.0000 mg | ORAL_TABLET | Freq: Every morning | ORAL | 0 refills | Status: DC

## 2016-10-06 MED ORDER — ONDANSETRON HCL 4 MG/2ML IJ SOLN: 4.0000 mg | Freq: Four times a day (QID) | INTRAMUSCULAR | 0 refills | Status: DC | PRN

## 2016-10-06 MED ORDER — WARFARIN SODIUM 2 MG PO TABS: 2.0000 mg | ORAL_TABLET | Freq: Every day | ORAL | 1 refills | Status: DC

## 2016-10-06 MED ORDER — BENZONATATE 100 MG PO CAPS: 100.0000 mg | ORAL_CAPSULE | Freq: Three times a day (TID) | ORAL | 0 refills | Status: DC

## 2016-10-06 MED ORDER — INSULIN ASPART 100 UNIT/ML ~~LOC~~ SOLN: 0.0000 [IU] | SUBCUTANEOUS | 11 refills | Status: DC

## 2016-10-06 MED ORDER — HYDROCODONE-ACETAMINOPHEN 7.5-325 MG/15ML PO SOLN
15.0000 mL | ORAL | 0 refills | Status: DC | PRN
Start: 2016-10-06 — End: 2016-11-01

## 2016-10-06 MED ORDER — HEPARIN SODIUM (PORCINE) 1000 UNIT/ML DIALYSIS: 40.0000 [IU]/kg | INTRAMUSCULAR | Status: DC | PRN

## 2016-10-06 MED ORDER — CLOPIDOGREL BISULFATE 75 MG PO TABS: 75.0000 mg | ORAL_TABLET | Freq: Every day | ORAL | 1 refills | Status: DC

## 2016-10-06 NOTE — Progress Notes (Signed)
Inpatient Rehabilitation  Continuing to follow along for timing of medical readiness prior to IP Rehab. We have beds available today and the beginning of next week.  Please call with questions.   Carmelia Roller., CCC/SLP Admission Coordinator  [place]  Cell [phone]

## 2016-10-06 NOTE — Progress Notes (Addendum)
Inpatient Rehabilitation  Received notification from RN and RNCM that patient was ready to discharge to CIR this afternoon.  I notified rehab MD, who reviewed chart.  Per MD WBC is trending up and plans for Cipro need to be clarified.  Notified RNCM and PA, Tessa.  Plan for my co-worker Gerlean Ren to follow up Monday in hopes of medical readiness and IP Rehab admission.    Carmelia Roller., CCC/SLP Admission Coordinator  [place]  Cell [phone]

## 2016-10-06 NOTE — Progress Notes (Signed)
SLP Cancellation Note  Patient Details Name: Deborah [name] MRN: [id number] DOB: [birth year]   Cancelled treatment:       Reason Eval/Treat Not Completed: Other (comment) Attempted to see pt who was working with other providers. Will return as able.   Germain Osgood 10/06/2016, 3:24 PM  Germain Osgood, M.A. CCC-SLP [phone]

## 2016-10-06 NOTE — Progress Notes (Signed)
CARDIAC REHAB PHASE I   PRE:  Rate/Rhythm: 99 SR    BP: sitting 124/64    SaO2: 99 RA  MODE:  Ambulation: 92 ft total (sat x1)  POST:  Rate/Rhythm: 106 ST    BP: sitting 119/86     SaO2: 97 RA  Pt likes to pull up on RW to stand. When we attempted to coach her otherwise, she struggled to comprehend. Unsteady walking with posterior lean at times and weak right leg. Sat to rest at 28 ft. Right leg weaker on return, not picking up all the way. To recliner again. VSS. Discussed briefly with pt MI, Plavix/taking meds, taking DM seriously and left diet sheet, and CRPII. Will send referral to [facility] for the future. Pt voiced understanding.   [place], ACSM 10/06/2016 3:41 PM

## 2016-10-06 NOTE — Care Management Note (Addendum)
Case Management Note  Patient Details  Name: Deborah [name] MRN: [id number] Date of Birth: Sep 06, 1956  Subjective/Objective:   Patient for dc to CIR . NCM spoke with Melissa in [place], she states the MD would like to see wbc trending down and need a definite plan for cipro before CIR can take patient.  NCM relayed this to the RN and she relayed message to [facility].                  Action/Plan:   Expected Discharge Date:                  Expected Discharge Plan:  IP Rehab Facility  In-House Referral:  Clinical Social Work  Discharge planning Services  CM Consult  Post Acute Care Choice:    Choice offered to:     DME Arranged:    DME Agency:     HH Arranged:    [place] Agency:     Status of Service:  Completed, signed off  If discussed at [employer], dates discussed:    Additional Comments:  Zenon Mayo, RN 10/06/2016, 3:29 PM

## 2016-10-06 NOTE — Progress Notes (Signed)
Patient ID: Deborah [name], female   DOB: 1956/04/15, 60 y.o.   MRN: [id number]   [facility] Progress Note   Assessment/ Plan:   1. Coronary artery disease status post STEMI and ruptured posterior papillary muscle with cardiogenic shock status post mitral valve replacement on 08/23/16 2. ESRD: On schedule for hemodialysis Tuesday/Thursday/Saturday schedule via tunneled dialysis catheter. She has a left upper arm AVG that was created on 10/30 (brachioaxillary) by Dr. Donzetta Matters and should be usable in 4 weeks. I have ordered for dialysis again tomorrow-without compelling acute indications today. 3. 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 Nutrition: Low albumin secondary to critical illness/prolonged hospitalization, continue nutritional supplements 6. Cough/intermittent dyspnea: Started on empiric antibiotic therapy with ciprofloxacin as respiratory cultures are awaited. Previously dependent on ventilator for a prolonged period and transitioned over to tracheostomy that was decannulated last week. She continues to have moderate oropharyngeal dysphasia and is on a modified dysphagia 2/nectar thick liquids diet. Appears to be doing somewhat better  Subjective:   Reports that she had a comfortable night of sleep last night, improving cough/shortness of breath.    Objective:   BP 118/67 (BP Location: Right Arm)   Pulse 98   Temp 98.7 F (37.1 C) (Axillary)   Resp (!) 25   Ht 5\' 1"  (1.549 m)   Wt 57.7 kg (127 lb 3.3 oz)   SpO2 100%   BMI 24.04 kg/m   Physical Exam: GUY:QIHKVQQVZDG resting in bed-NG tube in situ  LOV:FIEPP regular rhythm, S1 and S2 normal  Resp:Coarse/rhonchorous breath sounds bilaterally with intermittent coughing  IRJ:JOAC, flat, nontender  Ext:No lower extremity edema. Left upper arm AVG with palpable thrill and good  quality bruit  Labs: BMET  Recent Labs Lab 09/30/16 0339 09/30/16 1541 10/04/16 0433 10/05/16 1938  NA 132* 133* 135 135  K 3.5 3.3* 3.4* 3.8  CL 96* 97* 96* 97*  CO2 23 26 26 26   GLUCOSE 238* 252* 263* 129*  BUN 65* 20 42* 24*  CREATININE 3.79* 2.05* 2.60* 1.97*  CALCIUM 9.5 8.9 8.9 8.7*  PHOS  --   --   --  1.2*   CBC  Recent Labs Lab 09/30/16 1541 10/03/16 1156 10/04/16 0433 10/05/16 1938  WBC 21.4* 13.0* 14.7* 17.0*  NEUTROABS  --  10.1*  --   --   HGB 10.6* 9.5* 10.3* 10.6*  HCT 33.3* 30.3* 33.4* 33.7*  MCV 90.2 89.9 90.0 89.4  PLT 405* 342 320 333   Medications:    . amiodarone  200 mg Oral BID  . atorvastatin  20 mg Oral q1800  . benzonatate  100 mg Oral TID  . chlorhexidine  15 mL Mouth Rinse BID  . ciprofloxacin  500 mg Oral QPM  . clopidogrel  75 mg Oral Daily  . darbepoetin (ARANESP) injection - DIALYSIS  100 mcg Intravenous Q Tue-HD  . famotidine  20 mg Oral Daily  . feeding supplement (NEPRO CARB STEADY)  1,000 mL Per Tube Q24H  . feeding supplement (PRO-STAT SUGAR FREE 64)  30 mL Per Tube TID  . Gerhardt's butt cream   Topical BID  . glycopyrrolate  1 mg Per Tube BID  . guaiFENesin  30 mL Per Tube Q6H  . hydrocortisone   Rectal BID  . insulin aspart  0-24 Units Subcutaneous Q4H  . insulin aspart  5 Units Subcutaneous TID WC  . insulin  detemir  20 Units Subcutaneous BID  . mouth rinse  15 mL Mouth Rinse q12n4p  . midodrine  10 mg Oral q morning - 10a  . multivitamin  15 mL Per Tube Daily  . warfarin  2 mg Oral q1800  . Warfarin - Physician Dosing Inpatient   Does not apply [id number]   Elmarie Shiley, MD 10/06/2016, 9:04 AM

## 2016-10-06 NOTE — Progress Notes (Signed)
      [address]       [place] [address]             [phone]        4 Days Post-Op Procedure(s) (LRB): INSERTION OF ARTERIOVENOUS (AV) GORE-TEX GRAFT ARM (Left)  Subjective: Patient continues productive cough  Objective: Vital signs in last 24 hours: Temp:  [97.4 F (36.3 C)-98.7 F (37.1 C)] 98.7 F (37.1 C) (11/03 0700) Pulse Rate:  [96-104] 98 (11/03 0700) Cardiac Rhythm: Sinus tachycardia (11/03 0840) Resp:  [15-30] 25 (11/03 0700) BP: (102-136)/(52-86) 118/67 (11/03 0700) SpO2:  [96 %-100 %] 100 % (11/03 0700) Weight:  [127 lb 3.3 oz (57.7 kg)-130 lb 1.1 oz (59 kg)] 127 lb 3.3 oz (57.7 kg) (11/03 0430)  Pre op weight 78 kg Current Weight  10/06/16 127 lb 3.3 oz (57.7 kg)       Intake/Output from previous day: 11/02 0701 - 11/03 0700 In: 625.2 [P.O.:90; NG/GT:535.2] Out: 1502 [Urine:1; Stool:1]   Physical Exam:  Cardiovascular: RRR Pulmonary: Mostly clear Abdomen: Soft, non tender, bowel sounds present. Extremities: No lower extremity edema. Wounds: Clean and dry.  No erythema or signs of infection.  Lab Results: CBC:  Recent Labs  10/04/16 0433 10/05/16 1938  WBC 14.7* 17.0*  HGB 10.3* 10.6*  HCT 33.4* 33.7*  PLT 320 333   BMET:   Recent Labs  10/04/16 0433 10/05/16 1938  NA 135 135  K 3.4* 3.8  CL 96* 97*  CO2 26 26  GLUCOSE 263* 129*  BUN 42* 24*  CREATININE 2.60* 1.97*  CALCIUM 8.9 8.7*    PT/INR:  Lab Results  Component Value Date   INR 1.57 10/06/2016   INR 1.32 10/05/2016   INR 1.23 10/04/2016   ABG:  INR: Will add last result for INR, ABG once components are confirmed Will add last 4 CBG results once components are confirmed  Assessment/Plan:  1. CV - S/p STEMI, s/p PCI,s/p MV replacement. On Amiodarone 200 mg bid, Midodrine 10 mg tid, Plavix 75 mg daily,and Coumadin . INR increased from 1.32 to 1.57. 2.  Pulmonary - On room air. Tessalon Perles and Robitussin as needed.  Encourage  incentive spirometer. 3. DM-CBGs 405/209/253. On Metformin pre op. Only on Insulin post op and will increase for better glucose control. Pre op HGA1C 13.3. Will need close medical follow up after discharge. 4.  Anemia - Last H and H stable at 10.6 and 33.7 5. ESRD- Per Nephrology. 6. GI-Diet is dysphagia 2 (fine chop, nectar thick). Continue with nocturnal TFs and nutrition recommendations Tuesday 7. ID-on Cipro. Last WBC 17,000. Remains afebrile. Few WBC, few gram positive cocci in tracheal aspirate. Check CBC in am 8. Hopefully, to CIR soon  ZIMMERMAN,DONIELLE MPA-C 10/06/2016,10:55 AM

## 2016-10-06 NOTE — Progress Notes (Signed)
Patient arrived the unit from 3S on a hospital bed vital signs obtained see flowsheet, placed on tele,ccmd notified patient oriented to room and staff, bed in lowest position , call light within reach will continue to monitor.

## 2016-10-06 NOTE — Progress Notes (Addendum)
Nutrition Follow-up  DOCUMENTATION CODES:   Not applicable  INTERVENTION:  With ongoing inadequate oral intake, Continue nocturnal TF rate to Nepro at 70 ml/h x 12 hours (from 2000-0800) to provide 1512 kcal (89% of minimum est needs) and 68 gm protein (76% of minimum est needs).  Continue Prostat 30 ml TID.  Add Magic Cup (each supplement provides 290 kcal and 9 grams of protein) or Mighty Shake II (each supplement provides 480-500 kcals and 20-23 grams of protein)with meals.  Plans to discharge to CIR.  NUTRITION DIAGNOSIS:   Inadequate oral intake related to inability to eat as evidenced by NPO status; ongoing  GOAL:   Patient will meet greater than or equal to 90% of their needs; met via TF  MONITOR:   PO intake, Diet advancement, Labs, Weight trends, TF tolerance, Skin, I & O's  REASON FOR ASSESSMENT:   Consult Enteral/tube feeding initiation and management  ASSESSMENT:   60 y/o woman with DM2, HTN, HL and noncompliance presented to [facility] ER with infrerior posterior STEMI on 9/12. Taken to cath lab and found to have occluded small to moderate OM-1 branch. Underwent successful PCS with stent x 2 by Dr. Clayborn Bigness. Coronaries otherwise ok. EF 55-60% with severe MR confirmed by echo.   RD consulted as pt with poor po intake and to discuss food choices with patient. Pt reports having a sore throat which has been affecting her po intake. Pt educated on the types of foods allowed on a dysphagia 2 diet and encouraged to order foods with gravies on them to improve moisture. Pt expressed understanding.   Pt is currently receiving nocturnal tube feeds as po intake has only been 5-20%. RD to continue with current orders.   Plans to discharge to CIR.  Labs and medications reviewed. CBG's 118-405 mg/dL.  Diet Order:  DIET DYS 2 Room service appropriate? Yes; Fluid consistency: Nectar Thick  Skin:  Wound (see comment) (Stage 2 to buttocks and neck)  Last BM:  11/3  Height:    Ht Readings from Last 1 Encounters:  10/06/16 5' 1"  (1.549 m)    Weight:   Wt Readings from Last 1 Encounters:  10/06/16 127 lb 3.3 oz (57.7 kg)    Ideal Body Weight:  47.7 kg  BMI:  Body mass index is 24.04 kg/m.  Estimated Nutritional Needs:   Kcal:  1700-1900  Protein:  90-110 grams  Fluid:  per MD  EDUCATION NEEDS:   No education needs identified at this time  Corrin Parker, MS, RD, LDN Pager # [phone] After hours/ weekend pager # [phone]

## 2016-10-06 NOTE — Progress Notes (Signed)
Occupational Therapy Treatment Patient Details Name: Deborah [name] MRN: [id number] DOB: [birth year] Today's Date: 10/06/2016    History of present illness 60 yo admitted to [facility] 9/12 with STEMI develped cardiogenic shock with ETT and cRRT initiated on 9/14 after cath, transfer to [facility] 9/79, metabolic acidosis, underwent MVR 9/20, extubated 9/29, reintubated 9/30, 9/25-9/29 hemiplegia developed and resolved, trach and bronchoscopy 10/5. s/p AVG LUE 10/30. PMHx: DM, HTN, HLD   OT comments  Pt demonstrates improving activity tolerance and independence with functional transfers.  Worked on facilitation of trunk control  With standing activities and sit <> stand   Follow Up Recommendations  CIR;Supervision/Assistance - 24 hour    Equipment Recommendations  3 in 1 bedside comode    Recommendations for Other Services Rehab consult    Precautions / Restrictions Precautions Precautions: Sternal;Fall Precaution Comments: panda       Mobility Bed Mobility Overal bed mobility: Needs Assistance Bed Mobility: Supine to Sit     Supine to sit: Min assist     General bed mobility comments: min A to lift trunk.  HOB elevated   Transfers Overall transfer level: Needs assistance Equipment used: 1 person hand held assist Transfers: Sit to/from [facility] Sit to Stand: Min assist Stand pivot transfers: Min assist       General transfer comment: Min A for balance and faciliation of trunk activation.  and verbal cues for sequencing     Balance Overall balance assessment: Needs assistance Sitting-balance support: Feet supported;Single extremity supported Sitting balance-Leahy Scale: Fair Sitting balance - Comments: close min guard assist    Standing balance support: Single extremity supported Standing balance-Leahy Scale: Poor Standing balance comment: min A and UE support                    ADL Overall ADL's : Needs assistance/impaired     Grooming: Wash/dry  hands;Wash/dry face;Min guard;Sitting                   Toilet Transfer: Minimal assistance;Stand-pivot;BSC   Toileting- Water quality scientist and Hygiene: Total assistance;Sit to/from stand       Functional mobility during ADLs: Minimal assistance (transfers.  +2 for safety with ambulation )        Vision                     Perception     Praxis      Cognition   Behavior During Therapy: Anxious Overall Cognitive Status: Impaired/Different from baseline Area of Impairment: Attention;Following commands;Safety/judgement;Problem solving   Current Attention Level: Selective Memory: Decreased recall of precautions;Decreased short-term memory  Following Commands: Follows one step commands inconsistently Safety/Judgement: Decreased awareness of safety;Decreased awareness of deficits   Problem Solving: Slow processing;Difficulty sequencing;Requires verbal cues;Requires tactile cues      Extremity/Trunk Assessment               Exercises General Exercises - Upper Extremity Shoulder Flexion: AROM;10 reps;Seated;Both Other Exercises Other Exercises: worked on controlled sit <> stand and standing with facilitation of abs - pt tennds to overuse back  and hip extensors.  She required 3 seated rest breaks.  VSS remained stable    Shoulder Instructions       General Comments      Pertinent Vitals/ Pain       Pain Assessment: Faces Faces Pain Scale: Hurts a little bit Pain Location: generalized  Pain Descriptors / Indicators: Grimacing Pain Intervention(s): Monitored during session  Home  Living                                          Prior Functioning/Environment              Frequency  Min 3X/week        Progress Toward Goals  OT Goals(current goals can now be found in the care plan section)  Progress towards OT goals: Goals met and updated - see care plan  Acute Rehab OT Goals Patient Stated Goal: to go home  OT  Goal Formulation: With patient Time For Goal Achievement: 10/20/16 Potential to Achieve Goals: Good  Plan Discharge plan remains appropriate    Co-evaluation                 End of Session Equipment Utilized During Treatment: Gait belt   Activity Tolerance Patient tolerated treatment well   Patient Left in chair;with call bell/phone within reach   Nurse Communication Mobility status        Time: 6503-5465 OT Time Calculation (min): 31 min  Charges: OT General Charges $OT Visit: 1 Procedure OT Treatments $Neuromuscular Re-education: 23-37 mins  Bane Hagy M 10/06/2016, 1:24 PM

## 2016-10-06 NOTE — Progress Notes (Signed)
Inpatient Diabetes Program Recommendations  AACE/ADA: New Consensus Statement on Inpatient Glycemic Control (2015)  Target Ranges:  Prepandial:   less than 140 mg/dL      Peak postprandial:   less than 180 mg/dL (1-2 hours)      Critically ill patients:  140 - 180 mg/dL   Results for CLOIS, MONTAVON (MRN [id number]) as of 10/06/2016 13:31  Ref. Range 10/06/2016 00:39 10/06/2016 04:30 10/06/2016 07:40 10/06/2016 11:17  Glucose-Capillary Latest Ref Range: 65 - 99 mg/dL 405 (H) 209 (H) 253 (H) 238 (H)      Current Insulin Orders:  Levemir 20 units BID       Novolog 0-24 units Q4 hours       Novolog 5 units (to be given at 8pm, 12am, and 4am during tube feed administration)      Note glucose elevated throughout the night with tube feedings.   Please consider increasing Novolog to 8 units TID (at 8pm, 12am, and 4 am; in addition to Novolog correction scale).      --Will follow patient during hospitalization--  Wyn Quaker RN, MSN, CDE Diabetes Coordinator Inpatient Glycemic Control Team Team Pager: [phone] (8a-5p)

## 2016-10-07 ENCOUNTER — Inpatient Hospital Stay (HOSPITAL_COMMUNITY): Payer: Self-pay | Admitting: Occupational Therapy

## 2016-10-07 ENCOUNTER — Inpatient Hospital Stay (HOSPITAL_COMMUNITY): Payer: Medicaid Other

## 2016-10-07 ENCOUNTER — Inpatient Hospital Stay (HOSPITAL_COMMUNITY): Payer: Self-pay | Admitting: Speech Pathology

## 2016-10-07 LAB — GLUCOSE, CAPILLARY
GLUCOSE-CAPILLARY: 197 mg/dL — AB (ref 65–99)
GLUCOSE-CAPILLARY: 302 mg/dL — AB (ref 65–99)
GLUCOSE-CAPILLARY: 165 mg/dL — AB (ref 65–99)
Glucose-Capillary: 185 mg/dL — ABNORMAL HIGH (ref 65–99)
Glucose-Capillary: 236 mg/dL — ABNORMAL HIGH (ref 65–99)
Glucose-Capillary: 283 mg/dL — ABNORMAL HIGH (ref 65–99)

## 2016-10-07 LAB — RENAL FUNCTION PANEL
ALBUMIN: 2.6 g/dL — AB (ref 3.5–5.0)
Anion gap: 15 (ref 5–15)
BUN: 96 mg/dL — AB (ref 6–20)
CO2: 23 mmol/L (ref 22–32)
CREATININE: 4.24 mg/dL — AB (ref 0.44–1.00)
Calcium: 8.9 mg/dL (ref 8.9–10.3)
Chloride: 90 mmol/L — ABNORMAL LOW (ref 101–111)
GFR, EST AFRICAN AMERICAN: 12 mL/min — AB (ref >60)
GFR, EST NON AFRICAN AMERICAN: 11 mL/min — AB (ref >60)
Glucose, Bld: 233 mg/dL — ABNORMAL HIGH (ref 65–99)
PHOSPHORUS: 2.3 mg/dL — AB (ref 2.5–4.6)
POTASSIUM: 4 mmol/L (ref 3.5–5.1)
Sodium: 128 mmol/L — ABNORMAL LOW (ref 135–145)

## 2016-10-07 LAB — CBC
HEMATOCRIT: 33.9 % — AB (ref 36.0–46.0)
HEMATOCRIT: 34.3 % — AB (ref 36.0–46.0)
Hemoglobin: 10.4 g/dL — ABNORMAL LOW (ref 12.0–15.0)
Hemoglobin: 10.6 g/dL — ABNORMAL LOW (ref 12.0–15.0)
MCH: 27.6 pg (ref 26.0–34.0)
MCH: 27.7 pg (ref 26.0–34.0)
MCHC: 30.7 g/dL (ref 30.0–36.0)
MCHC: 30.9 g/dL (ref 30.0–36.0)
MCV: 89.9 fL (ref 78.0–100.0)
MCV: 89.6 fL (ref 78.0–100.0)
PLATELETS: 390 10*3/uL (ref 150–400)
Platelets: 388 10*3/uL (ref 150–400)
RBC: 3.77 MIL/uL — AB (ref 3.87–5.11)
RBC: 3.83 MIL/uL — AB (ref 3.87–5.11)
RDW: 16.1 % — AB (ref 11.5–15.5)
RDW: 16 % — AB (ref 11.5–15.5)
WBC: 18.3 10*3/uL — AB (ref 4.0–10.5)
WBC: 17.8 10*3/uL — AB (ref 4.0–10.5)

## 2016-10-07 LAB — PROTIME-INR
INR: 1.85
Prothrombin Time: 21.6 seconds — ABNORMAL HIGH (ref 11.4–15.2)

## 2016-10-07 NOTE — Progress Notes (Addendum)
      [address]       [place] [address]             [phone]      5 Days Post-Op Procedure(s) (LRB): INSERTION OF ARTERIOVENOUS (AV) GORE-TEX GRAFT ARM (Left) Subjective: Still complains of cough  Objective: Vital signs in last 24 hours: Temp:  [97.5 F (36.4 C)-99 F (37.2 C)] 97.5 F (36.4 C) (11/04 0410) Pulse Rate:  [79-100] 100 (11/04 0410) Cardiac Rhythm: Normal sinus rhythm (11/03 2020) Resp:  [20-29] 20 (11/04 0410) BP: (101-119)/(55-69) 101/55 (11/04 0410) SpO2:  [96 %-100 %] 99 % (11/04 0410) Weight:  [128 lb 1.4 oz (58.1 kg)] 128 lb 1.4 oz (58.1 kg) (11/04 0410)    Intake/Output from previous day: 11/03 0701 - 11/04 0700 In: 910 [NG/GT:910] Out: 0  Intake/Output this shift: No intake/output data recorded.  General appearance: alert, cooperative and no distress Heart: regular rate and rhythm Lungs: clear to auscultation bilaterally, diminished in bilateral LL Abdomen: soft, non-tender; bowel sounds normal; no masses,  no organomegaly Extremities: extremities normal, atraumatic, no cyanosis or edema Wound: clean and dry  Lab Results:  Recent Labs  10/05/16 1938 10/07/16 0258  WBC 17.0* 18.3*  HGB 10.6* 10.4*  HCT 33.7* 33.9*  PLT 333 388   BMET:  Recent Labs  10/05/16 1938  NA 135  K 3.8  CL 97*  CO2 26  GLUCOSE 129*  BUN 24*  CREATININE 1.97*  CALCIUM 8.7*    PT/INR:  Recent Labs  10/07/16 0258  LABPROT 21.6*  INR 1.85   ABG    Component Value Date/Time   PHART 7.487 (H) 10/04/2016 1430   HCO3 28.4 (H) 10/04/2016 1430   TCO2 26 09/08/2016 0412   ACIDBASEDEF 4.1 (H) 09/02/2016 1850   O2SAT 95.5 10/04/2016 1430   CBG (last 3)   Recent Labs  10/06/16 1954 10/06/16 2332 10/07/16 0405  GLUCAP 247* 283* 236*    Assessment/Plan: S/P Procedure(s) (LRB): INSERTION OF ARTERIOVENOUS (AV) GORE-TEX GRAFT ARM (Left)   1. CV - S/p STEMI, s/p PCI,s/p MV replacement. On Amiodarone 200 mg bid, Midodrine  10 mg tid, Plavix 75 mg daily,and Coumadin . INR increased from 1.57 to 1.85. 2.  Pulmonary - On room air. Tessalon Perles and Robitussin as needed.  Encourage incentive spirometer. 3. DM-CBGs 247/283/236. On Metformin pre op. Only on Insulin post op and will increase for better glucose control. Pre op HGA1C 13.3. Will need close medical follow up after discharge.  4.  Anemia - Last H and H stable at 10.4 and 33.9 5. ESRD- Per Nephrology. 6. GI-Diet is dysphagia 2 (fine chop, nectar thick). Continue with nocturnal TFs and nutrition recommendations Tuesday 7. ID-on Cipro. Last WBC 18,000. Remains afebrile. Few WBC, few gram positive cocci in tracheal aspirate.  8. Hopefully, to CIR soon   LOS: 50 days    Deborah [name] 10/07/2016  She was supposed to go to CIR but started on Cipro for some reason. She has been afebrile. WBC is 17.8 but has been chronically elevated for many weeks so I don't know what the significance of this is. Repeat sputum sent.

## 2016-10-07 NOTE — Progress Notes (Signed)
      [address]       [place] [address]             [phone]     Good NG tube placement per xray. Report below.  FINDINGS: Scattered large and small bowel gas is noted. Contrast fecal material is noted throughout the colon. A feeding catheter is noted with the tip in the distal stomach directed towards the pylorus. Postsurgical changes in the chest are noted.  IMPRESSION: Feeding catheter in the distal stomach.  Restart tube feeding tonight.

## 2016-10-07 NOTE — Progress Notes (Signed)
Patient ID: Deborah [name], female   DOB: 11-27-1956, 60 y.o.   MRN: [id number]  [facility] Progress Note   Assessment/ Plan:   1. Coronary artery disease status post STEMI and ruptured posterior papillary muscle with cardiogenic shock status post mitral valve replacement on 08/23/16 2. ESRD: On schedule for hemodialysis Tuesday/Thursday/Saturday schedule (dialysis today) via tunneled dialysis catheter. She has a left upper arm AVG that was created on 10/30 (brachioaxillary) by Dr. Donzetta Matters and should be usable in 4 weeks. She has been on dialysis now for the past 6 weeks or so-chances of renal recovery at this point slim to none. 3. 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 Nutrition: Low albumin secondary to critical illness/prolonged hospitalization, continue nutritional supplements 6. Cough/intermittent dyspnea: On empiric antibiotic therapy for possible aspiration pneumonia-dietary adjustments per MBS evaluation.  Subjective:   Continues to have intermittent cough. Concerned that people do not believe that she is short of breath. I have reassured her that we're always concerned she is and are engaging in interventions to help.    Objective:   BP (!) 101/55 (BP Location: Right Arm)   Pulse 100   Temp 97.5 F (36.4 C) (Oral)   Resp 20   Ht 5\' 1"  (1.549 m)   Wt 58.1 kg (128 lb 1.4 oz)   SpO2 99%   BMI 24.20 kg/m   Physical Exam: HTD:[id number] resting in bed-NG tube in situ  IOM:BTDHR regular rhythm, S1 and S2 normal  Resp:Coarse/rhonchorous breath sounds bilaterally with intermittent coughing  CBU:LAGT, flat, nontender  Ext:No lower extremity edema. Left upper arm AVG with palpable thrill and good quality bruit  Labs: BMET  Recent Labs Lab 09/30/16 1541 10/04/16 0433 10/05/16 1938  NA 133* 135 135  K 3.3* 3.4* 3.8   CL 97* 96* 97*  CO2 26 26 26   GLUCOSE 252* 263* 129*  BUN 20 42* 24*  CREATININE 2.05* 2.60* 1.97*  CALCIUM 8.9 8.9 8.7*  PHOS  --   --  1.2*   CBC  Recent Labs Lab 10/03/16 1156 10/04/16 0433 10/05/16 1938 10/07/16 0258  WBC 13.0* 14.7* 17.0* 18.3*  NEUTROABS 10.1*  --   --   --   HGB 9.5* 10.3* 10.6* 10.4*  HCT 30.3* 33.4* 33.7* 33.9*  MCV 89.9 90.0 89.4 89.9  PLT 342 320 333 388   Medications:    . amiodarone  200 mg Oral BID  . atorvastatin  20 mg Oral q1800  . benzonatate  100 mg Oral TID  . chlorhexidine  15 mL Mouth Rinse BID  . ciprofloxacin  500 mg Oral QPM  . clopidogrel  75 mg Oral Daily  . darbepoetin (ARANESP) injection - DIALYSIS  100 mcg Intravenous Q Tue-HD  . famotidine  20 mg Oral Daily  . feeding supplement (NEPRO CARB STEADY)  1,000 mL Per Tube Q24H  . feeding supplement (PRO-STAT SUGAR FREE 64)  30 mL Per Tube TID  . Gerhardt's butt cream   Topical BID  . glycopyrrolate  1 mg Per Tube BID  . guaiFENesin  30 mL Per Tube Q6H  . hydrocortisone   Rectal BID  . insulin aspart  0-24 Units Subcutaneous Q4H  . insulin aspart  5 Units Subcutaneous TID WC  . insulin detemir  20 Units Subcutaneous BID  . mouth rinse  15 mL Mouth Rinse q12n4p  . midodrine  10 mg  Oral q morning - 10a  . multivitamin  15 mL Per Tube Daily  . phosphorus  500 mg Oral BID  . warfarin  2 mg Oral q1800  . Warfarin - Physician Dosing Inpatient   Does not apply [id number]   Elmarie Shiley, MD 10/07/2016, 8:31 AM

## 2016-10-07 NOTE — Progress Notes (Signed)
Patient arrived to unit by bed.  Reviewed treatment plan and this RN agrees with plan.  Report received from bedside RN, Estill Bamberg.  Consent verified.  Patient A & O X 4.   Lung sounds diminished to ausculation in all fields. Generalized edema. Cardiac:  NSR.  Removed caps and cleansed LIJ catheter with chlorhedxidine.  Aspirated ports of heparin and flushed them with saline per protocol.  Connected and secured lines, initiated treatment at 1141.  UF Goal of 2062mL and net fluid removal 1.5L.  Will continue to monitor.

## 2016-10-07 NOTE — Progress Notes (Signed)
Pt NT tube not taped to nose, tape on tube noted to be about 12" from nose.  Tube feeding stopped and pt assessed for proper placement.  Able to auscultate air flush in stomach, lungs clear.  Pt coughing, but states she has had cough "for a while" and no change.  Notified T. Harriet Pho, [place] from Cardiothoracic as well as on call RD.  Holding feedings today, per RD, will resume tonight as ordered.  Joellyn Rued will follow up to see if further evaluation needed for proper placement of NG tube.

## 2016-10-08 ENCOUNTER — Inpatient Hospital Stay (HOSPITAL_COMMUNITY): Payer: Medicaid Other

## 2016-10-08 LAB — PROTIME-INR
INR: 1.68
PROTHROMBIN TIME: 20 s — AB (ref 11.4–15.2)

## 2016-10-08 LAB — GLUCOSE, CAPILLARY
GLUCOSE-CAPILLARY: 208 mg/dL — AB (ref 65–99)
GLUCOSE-CAPILLARY: 85 mg/dL (ref 65–99)
Glucose-Capillary: 127 mg/dL — ABNORMAL HIGH (ref 65–99)
Glucose-Capillary: 156 mg/dL — ABNORMAL HIGH (ref 65–99)
Glucose-Capillary: 193 mg/dL — ABNORMAL HIGH (ref 65–99)
Glucose-Capillary: 215 mg/dL — ABNORMAL HIGH (ref 65–99)
Glucose-Capillary: 55 mg/dL — ABNORMAL LOW (ref 65–99)

## 2016-10-08 LAB — BASIC METABOLIC PANEL
Anion gap: 15 (ref 5–15)
BUN: 34 mg/dL — ABNORMAL HIGH (ref 6–20)
CO2: 25 mmol/L (ref 22–32)
Calcium: 9.1 mg/dL (ref 8.9–10.3)
Chloride: 94 mmol/L — ABNORMAL LOW (ref 101–111)
Creatinine, Ser: 2.66 mg/dL — ABNORMAL HIGH (ref 0.44–1.00)
GFR calc Af Amer: 21 mL/min — ABNORMAL LOW (ref >60)
GFR calc non Af Amer: 19 mL/min — ABNORMAL LOW (ref >60)
Glucose, Bld: 238 mg/dL — ABNORMAL HIGH (ref 65–99)
Potassium: 3.5 mmol/L (ref 3.5–5.1)
Sodium: 134 mmol/L — ABNORMAL LOW (ref 135–145)

## 2016-10-08 MED ORDER — RENA-VITE PO TABS
1.0000 | ORAL_TABLET | Freq: Every day | ORAL | Status: DC
  Administered 2016-10-08 – 2016-10-10 (×3): 1 via ORAL
  Filled 2016-10-08 (×3): qty 1

## 2016-10-08 MED ORDER — WARFARIN SODIUM 3 MG PO TABS
3.0000 mg | ORAL_TABLET | Freq: Every day | ORAL | Status: DC
  Administered 2016-10-08 – 2016-10-09 (×2): 3 mg via ORAL
  Filled 2016-10-08 (×2): qty 1

## 2016-10-08 MED ORDER — NEPRO/CARBSTEADY PO LIQD
237.0000 mL | Freq: Two times a day (BID) | ORAL | Status: DC
  Administered 2016-10-08 – 2016-10-09 (×3): 237 mL via ORAL
  Filled 2016-10-08 (×10): qty 237

## 2016-10-08 NOTE — Progress Notes (Signed)
Pt pressed call light.  This nurse answered call light.  Pt coughing, wet cough, Pt indicating she is having difficulty breathing.  Requested NT get Pt oxygen saturation, called primary nurse to room. Outreach made to Rapid Response and respiratory therapist.  NG tube feeding stopped.  Pt satting 98% on RA.  Pt lung sounds diminished but clear.  Respiratory therapist started breathing tx for Pt.   Outreach made to on call attending and call back number left.  Rapid response nurse arrived and assessed Pt.  Call placed for stat xray to check for NG tube placement.  Continuous monitoring of Pt, Pt continues to cough, wet cough, and continues to appear in mild distress.  Per advisement of Rapid Response nurse, NG tube removed.  NG tube removed without difficulty.  Xray technician arrived and obtained chest xray.  Pt lung sounds improved per RT after breathing treatment.  Pt able to speak at this time, and states she still feels SOB and would like oxygen continued.  Continued O2 via [place] at 2L for Pt comfort.  Await further orders and will continue to closely monitor Pt.

## 2016-10-08 NOTE — Progress Notes (Signed)
Pt lying in bed, breathing has improved.  Pt continues with intermittent wet cough.  Continue to encourage Pt to use IS and flutter valve.  Pt indicates she is.  Will cont to monitor.

## 2016-10-08 NOTE — Progress Notes (Signed)
Patient ID: Deborah [name], female   DOB: 12-28-1955, 60 y.o.   MRN: [id number]  [facility] Progress Note   Assessment/ Plan:   1. Coronary artery disease status post STEMI and ruptured posterior papillary muscle with cardiogenic shock status post mitral valve replacement on 08/23/16 2. ESRD: Hemodialysis done yesterday-- on a Tuesday/Thursday/Saturday schedule via tunneled dialysis catheter. S/P LUA AVG placed on 10/30 (brachioaxillary) by Dr. Donzetta Matters -maturation in 4 weeks. This is about her seventh week of dependency on dialysis-chance of recovery remains slim to none. 3. 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 Nutrition: Low albumin secondary to critical illness/prolonged hospitalization, ((Nepro/continue renal vitamin) 6. Cough/intermittent dyspnea: Prolonged ventilatory dependency with consequent tracheostomy was decannulated successfully last week-over the latter part of this week, she continued to struggle with problems with intermittent dyspnea and yesterday underwent removal of her NG tube that has significantly improved her shortness of breath.  Subjective:   Continues to have intermittent cough. Concerned that people do not believe that she is short of breath. I have reassured her that we're always concerned she is and are engaging in interventions to help.    Objective:   BP (!) 93/50 (BP Location: Right Arm)   Pulse (!) 101   Temp 98.2 F (36.8 C) (Oral)   Resp 18   Ht 5\' 1"  (1.549 m)   Wt 58.5 kg (128 lb 15.5 oz)   SpO2 100%   BMI 24.37 kg/m   Physical Exam: FAO:ZHYQMVHQION resting in bed-NG tube in situ  GEX:BMWUX regular rhythm, S1 and S2 normal  Resp:Coarse/rhonchorous breath sounds bilaterally with intermittent coughing  LKG:MWNU, flat, nontender  Ext:No lower extremity edema. Left upper arm AVG with palpable  thrill and good quality bruit  Labs: BMET  Recent Labs Lab 10/04/16 0433 10/05/16 1938 10/07/16 0904 10/08/16 0123  NA 135 135 128* 134*  K 3.4* 3.8 4.0 3.5  CL 96* 97* 90* 94*  CO2 26 26 23 25   GLUCOSE 263* 129* 233* 238*  BUN 42* 24* 96* 34*  CREATININE 2.60* 1.97* 4.24* 2.66*  CALCIUM 8.9 8.7* 8.9 9.1  PHOS  --  1.2* 2.3*  --    CBC  Recent Labs Lab 10/03/16 1156 10/04/16 0433 10/05/16 1938 10/07/16 0258 10/07/16 0904  WBC 13.0* 14.7* 17.0* 18.3* 17.8*  NEUTROABS 10.1*  --   --   --   --   HGB 9.5* 10.3* 10.6* 10.4* 10.6*  HCT 30.3* 33.4* 33.7* 33.9* 34.3*  MCV 89.9 90.0 89.4 89.9 89.6  PLT 342 320 333 388 390   Medications:    . amiodarone  200 mg Oral BID  . atorvastatin  20 mg Oral q1800  . benzonatate  100 mg Oral TID  . chlorhexidine  15 mL Mouth Rinse BID  . ciprofloxacin  500 mg Oral QPM  . clopidogrel  75 mg Oral Daily  . darbepoetin (ARANESP) injection - DIALYSIS  100 mcg Intravenous Q Tue-HD  . famotidine  20 mg Oral Daily  . feeding supplement (NEPRO CARB STEADY)  1,000 mL Per Tube Q24H  . feeding supplement (PRO-STAT SUGAR FREE 64)  30 mL Per Tube TID  . Gerhardt's butt cream   Topical BID  . glycopyrrolate  1 mg Per Tube BID  . guaiFENesin  30 mL Per Tube Q6H  . hydrocortisone   Rectal BID  . insulin aspart  0-24 Units Subcutaneous  Q4H  . insulin aspart  5 Units Subcutaneous TID WC  . insulin detemir  20 Units Subcutaneous BID  . mouth rinse  15 mL Mouth Rinse q12n4p  . midodrine  10 mg Oral q morning - 10a  . multivitamin  15 mL Per Tube Daily  . phosphorus  500 mg Oral BID  . warfarin  3 mg Oral q1800  . Warfarin - Physician Dosing Inpatient   Does not apply [id number]   Elmarie Shiley, MD 10/08/2016, 8:25 AM

## 2016-10-08 NOTE — Progress Notes (Addendum)
[address]       [place] [address]             [phone]      6 Days Post-Op Procedure(s) (LRB): INSERTION OF ARTERIOVENOUS (AV) GORE-TEX GRAFT ARM (Left) Subjective: Events of last night noted. Patient got short of breath and there was suspected aspiration. Therefore her NG tube was pulled. Eating oatmeal this morning, still coughing.  Objective: Vital signs in last 24 hours: Temp:  [97.5 F (36.4 C)-98.2 F (36.8 C)] 98.2 F (36.8 C) (11/05 0300) Pulse Rate:  [94-117] 101 (11/05 0300) Cardiac Rhythm: Sinus tachycardia (11/04 1914) Resp:  [16-18] 18 (11/05 0300) BP: (89-115)/(50-66) 93/50 (11/05 0300) SpO2:  [98 %-100 %] 100 % (11/05 0300) Weight:  [125 lb 10.6 oz (57 kg)-128 lb 15.5 oz (58.5 kg)] 128 lb 15.5 oz (58.5 kg) (11/05 0325)     Intake/Output from previous day: 11/04 0701 - 11/05 0700 In: 240 [P.O.:240] Out: 1500  Intake/Output this shift: No intake/output data recorded.  General appearance: alert, cooperative and no distress Heart: regular rate and rhythm, S1, S2 normal, no murmur, click, rub or gallop Lungs: clear to auscultation bilaterally Abdomen: diminished bowel sounds Extremities: extremities normal, atraumatic, no cyanosis or edema Wound: clean and dry  Lab Results:  Recent Labs  10/07/16 0258 10/07/16 0904  WBC 18.3* 17.8*  HGB 10.4* 10.6*  HCT 33.9* 34.3*  PLT 388 390   BMET:  Recent Labs  10/07/16 0904 10/08/16 0123  NA 128* 134*  K 4.0 3.5  CL 90* 94*  CO2 23 25  GLUCOSE 233* 238*  BUN 96* 34*  CREATININE 4.24* 2.66*  CALCIUM 8.9 9.1    PT/INR:  Recent Labs  10/08/16 0123  LABPROT 20.0*  INR 1.68   ABG    Component Value Date/Time   PHART 7.487 (H) 10/04/2016 1430   HCO3 28.4 (H) 10/04/2016 1430   TCO2 26 09/08/2016 0412   ACIDBASEDEF 4.1 (H) 09/02/2016 1850   O2SAT 95.5 10/04/2016 1430   CBG (last 3)   Recent Labs  10/07/16 2039 10/07/16 2357 10/08/16 0418  GLUCAP 185* 283*  193*    Assessment/Plan: S/P Procedure(s) (LRB): INSERTION OF ARTERIOVENOUS (AV) GORE-TEX GRAFT ARM (Left)   1. CV - S/p STEMI, s/p PCI,s/p MV replacement. On Amiodarone 200 mg bid, Midodrine 10 mg tid, Plavix 75 mg daily,and Coumadin . INR decreased from 1.85 to 1.68. Will increase coumadin to 3 mg daily.  2. Pulmonary - On room air. Tessalon Perles and Robitussin as needed. Encourage incentive spirometer. 3. DM-CBGs 185/283/193. On Metformin pre op. On SSI insulin and scheduled insulin. Pre op HGA1C 13.3. Will need close medical follow up after discharge.  4. Anemia - Last H and H stable at 10.6and 34.3 5. ESRD- PerNephrology. 6. GI-Diet is dysphagia 2 (fine chop, nectar thick). NG tube removed last night due to suspected aspiration. 7. ID-on Cipro. Last WBC 17,000. Remains afebrile. Few WBC, few gram positive cocci in tracheal aspirate. MRSA negative 8. Hopefully, to CIR soon  Plan: NG removed last night. Will discuss with attending if we need to replace at this time. Would appreciate recommendations from dietitian. Re-consult SLP since have not evaluated in several days.    LOS: 51 days     Elgie Collard 10/08/2016   Chart reviewed, patient examined, agree with above. NG tube removed last night by RR nurse after she had some respiratory distress although sats were fine. CXR this am  looks fine. Will leave out for now and encourage po intake.

## 2016-10-08 NOTE — Progress Notes (Signed)
RT called to patient's room for distress. On arrival, patient had an SpO2 of 99% on room air, and on auscultation the left lung sounds were rhonchus and diminished in the bases. RT gave PRN Albuterol breathing treatment Chest and abdomen xray ordered by RN. Rapid Response RN advised removal of NG tube, and afterwards lung sounds were clear and diminished. RT placed patient on 4L nasal cannula for comfort and will continue to monitor.

## 2016-10-09 ENCOUNTER — Inpatient Hospital Stay (HOSPITAL_COMMUNITY): Payer: Self-pay | Admitting: Physical Therapy

## 2016-10-09 LAB — GLUCOSE, CAPILLARY
GLUCOSE-CAPILLARY: 162 mg/dL — AB (ref 65–99)
Glucose-Capillary: 160 mg/dL — ABNORMAL HIGH (ref 65–99)
Glucose-Capillary: 180 mg/dL — ABNORMAL HIGH (ref 65–99)
Glucose-Capillary: 234 mg/dL — ABNORMAL HIGH (ref 65–99)
Glucose-Capillary: 298 mg/dL — ABNORMAL HIGH (ref 65–99)
Glucose-Capillary: 337 mg/dL — ABNORMAL HIGH (ref 65–99)
Glucose-Capillary: 54 mg/dL — ABNORMAL LOW (ref 65–99)
Glucose-Capillary: 92 mg/dL (ref 65–99)

## 2016-10-09 LAB — BASIC METABOLIC PANEL
Anion gap: 16 — ABNORMAL HIGH (ref 5–15)
BUN: 60 mg/dL — ABNORMAL HIGH (ref 6–20)
CO2: 24 mmol/L (ref 22–32)
Calcium: 9.1 mg/dL (ref 8.9–10.3)
Chloride: 94 mmol/L — ABNORMAL LOW (ref 101–111)
Creatinine, Ser: 4.59 mg/dL — ABNORMAL HIGH (ref 0.44–1.00)
GFR calc Af Amer: 11 mL/min — ABNORMAL LOW (ref >60)
GFR calc non Af Amer: 10 mL/min — ABNORMAL LOW (ref >60)
Glucose, Bld: 150 mg/dL — ABNORMAL HIGH (ref 65–99)
Potassium: 4.1 mmol/L (ref 3.5–5.1)
Sodium: 134 mmol/L — ABNORMAL LOW (ref 135–145)

## 2016-10-09 LAB — PROTIME-INR
INR: 2.1
Prothrombin Time: 23.9 seconds — ABNORMAL HIGH (ref 11.4–15.2)

## 2016-10-09 MED ORDER — LOPERAMIDE HCL 1 MG/5ML PO LIQD
2.0000 mg | Freq: Once | ORAL | Status: AC
  Administered 2016-10-09: 2 mg via ORAL
  Filled 2016-10-09 (×2): qty 10

## 2016-10-09 MED ORDER — DEXTROSE 50 % IV SOLN
INTRAVENOUS | Status: AC
  Administered 2016-10-09: 50 mL
  Filled 2016-10-09: qty 50

## 2016-10-09 NOTE — Progress Notes (Signed)
CARDIAC REHAB PHASE I   PRE:  Rate/Rhythm: 93 SR    BP: sitting 112/65    SaO2: 100 1L  MODE:  Ambulation: stood x3, tx to recliner   POST:  Rate/Rhythm: 104 ST    BP: sitting 126/72     SaO2: 100 RA  Pt incontinent of stool. Planned to ambulate however she stood x3 with incontinence each time. Unable to walk in hall. Pt is stronger with standing today and able to stand several minutes at a time to be cleaned without significant imbalance. We d/c'd O2. Left in recliner. Pt feels diarrhea is due to lactose that she is not used to. Will notify RN. Put on chair alarm. [place], ACSM 10/09/2016 2:46 PM

## 2016-10-09 NOTE — Progress Notes (Signed)
Inpatient Rehabilitation  I discussed the patient with the rehab team this am and rehab PA and I visited pt. at the bedside.  Team would appreciate updated therapy notes to help determine pt's current therapy  tolerance and an updated WBC count.  We will monitor for progress today and will follow up tomorrow.  Please call if questions.  [place] Admissions Coordinator Cell [phone] Office [phone]

## 2016-10-09 NOTE — Progress Notes (Signed)
Hypoglycemic Event  CBG: 54  Treatment:dextrose 50%  Symptoms: none  Follow-up CBG: Time1650 CBG Result:180  Possible Reasons for Event: inadequate food intake  Comments/MD notified [place]

## 2016-10-09 NOTE — Progress Notes (Addendum)
[address]       [place] [address]             [phone]      7 Days Post-Op Procedure(s) (LRB): INSERTION OF ARTERIOVENOUS (AV) GORE-TEX GRAFT ARM (Left) Subjective: Says cough is better but still persists and is bothersome Swallowing better  Objective: Vital signs in last 24 hours: Temp:  [97.6 F (36.4 C)-98.2 F (36.8 C)] 97.6 F (36.4 C) (11/06 0409) Pulse Rate:  [90-104] 104 (11/06 1035) Cardiac Rhythm: Normal sinus rhythm (11/06 0708) Resp:  [18] 18 (11/06 0409) BP: (95-109)/(62-63) 109/63 (11/06 0409) SpO2:  [100 %] 100 % (11/06 0409) Weight:  [127 lb 10.3 oz (57.9 kg)] 127 lb 10.3 oz (57.9 kg) (11/06 0409)  Hemodynamic parameters for last 24 hours:    Intake/Output from previous day: 11/05 0701 - 11/06 0700 In: 660 [P.O.:660] Out: -  Intake/Output this shift: No intake/output data recorded.  General appearance: alert, cooperative and no distress Heart: regular rate and rhythm Lungs: + ronchi Abdomen: benign Extremities: no signif edema Wound: incis healing well  Lab Results:  Recent Labs  10/07/16 0258 10/07/16 0904  WBC 18.3* 17.8*  HGB 10.4* 10.6*  HCT 33.9* 34.3*  PLT 388 390   BMET:  Recent Labs  10/08/16 0123 10/09/16 0256  NA 134* 134*  K 3.5 4.1  CL 94* 94*  CO2 25 24  GLUCOSE 238* 150*  BUN 34* 60*  CREATININE 2.66* 4.59*  CALCIUM 9.1 9.1    PT/INR:  Recent Labs  10/09/16 0256  LABPROT 23.9*  INR 2.10   ABG    Component Value Date/Time   PHART 7.487 (H) 10/04/2016 1430   HCO3 28.4 (H) 10/04/2016 1430   TCO2 26 09/08/2016 0412   ACIDBASEDEF 4.1 (H) 09/02/2016 1850   O2SAT 95.5 10/04/2016 1430   CBG (last 3)   Recent Labs  10/08/16 2335 10/09/16 0409 10/09/16 0849  GLUCAP 156* 160* 162*    Meds Scheduled Meds: . amiodarone  200 mg Oral BID  . atorvastatin  20 mg Oral q1800  . chlorhexidine  15 mL Mouth Rinse BID  . clopidogrel  75 mg Oral Daily  . darbepoetin (ARANESP)  injection - DIALYSIS  100 mcg Intravenous Q Tue-HD  . famotidine  20 mg Oral Daily  . feeding supplement (NEPRO CARB STEADY)  1,000 mL Per Tube Q24H  . feeding supplement (NEPRO CARB STEADY)  237 mL Oral BID BM  . feeding supplement (PRO-STAT SUGAR FREE 64)  30 mL Per Tube TID  . Gerhardt's butt cream   Topical BID  . glycopyrrolate  1 mg Per Tube BID  . guaiFENesin  30 mL Per Tube Q6H  . hydrocortisone   Rectal BID  . insulin aspart  0-24 Units Subcutaneous Q4H  . insulin aspart  5 Units Subcutaneous TID WC  . insulin detemir  20 Units Subcutaneous BID  . mouth rinse  15 mL Mouth Rinse q12n4p  . midodrine  10 mg Oral q morning - 10a  . multivitamin  1 tablet Oral QHS  . multivitamin  15 mL Per Tube Daily  . warfarin  3 mg Oral q1800  . Warfarin - Physician Dosing Inpatient   Does not apply q1800   Continuous Infusions: . sodium chloride Stopped (10/02/16 1518)   PRN Meds:.albuterol, chlorpheniramine-HYDROcodone, diphenhydrAMINE, diphenhydrAMINE, HYDROcodone-acetaminophen, ondansetron (ZOFRAN) IV, ondansetron (ZOFRAN) IV, RESOURCE THICKENUP CLEAR, sodium chloride flush  Xrays Dg Chest Port 1 View  Result  Date: 10/08/2016 CLINICAL DATA:  Cough and dyspnea EXAM: PORTABLE CHEST 1 VIEW COMPARISON:  10/04/2016 FINDINGS: Dual-lumen left jugular central line extends into the right atrium. Prior sternotomy and cardiac valvuloplasty. Minimal curvilinear scarring or atelectasis in the left base. The lungs are otherwise clear. There is no large effusion. The pulmonary vasculature is normal. Unchanged cardiomegaly. Hilar and mediastinal contours are unremarkable and unchanged. IMPRESSION: Minimal curvilinear scarring or atelectasis in the left base. Unchanged cardiomegaly. Electronically Signed   By: Andreas Newport M.D.   On: 10/08/2016 03:04   Dg Abd Portable 1v  Result Date: 10/07/2016 CLINICAL DATA:  Check feeding catheter placement EXAM: PORTABLE ABDOMEN - 1 VIEW COMPARISON:  None.  FINDINGS: Scattered large and small bowel gas is noted. Contrast fecal material is noted throughout the colon. A feeding catheter is noted with the tip in the distal stomach directed towards the pylorus. Postsurgical changes in the chest are noted. IMPRESSION: Feeding catheter in the distal stomach. Electronically Signed   By: Inez Catalina M.D.   On: 10/07/2016 10:53    Assessment/Plan: S/P Procedure(s) (LRB): INSERTION OF ARTERIOVENOUS (AV) GORE-TEX GRAFT ARM (Left) 1 cough slowly improving- will be a somewhat chronic issue for a while 2 INR 2.1 - cont coumadin 3 sugar control a little better 4 nephrology managing renal issues/dialysis 5 poss CIR when all agree  LOS: 52 days    Deborah [name],Deborah [name] 10/09/2016  Patient appears stronger and more engaged She is walking the hallway with PT The feeding tube is out She has been taking more dysphagia 2 diet items but now complains of loose stools possibly related to her history of lactose intolerance and intake of yogurt etc. Lungs are clear, chest x-rays clear Cough is probably related to reflux, recent trach airway inflammation Normal sinus rhythm Course of oral Cipro has been stopped as sputum cultures have been negative, concern over recurrent Enterobacter pneumonia which the patient had last month Should be ready to go to inpatient rehabilitation in about 2 days

## 2016-10-09 NOTE — Progress Notes (Signed)
Patient ID: Deborah [name], female   DOB: 04-11-1956, 60 y.o.   MRN: [id number]  [facility] Progress Note   Subjective:   She continues with the nagging cough. Today says "hates the food" No pain,    Objective:   BP 117/60 (BP Location: Right Arm)   Pulse (!) 104   Temp 98.1 F (36.7 C) (Oral)   Resp 20   Ht 5\' 1"  (1.549 m)   Wt 57.9 kg (127 lb 10.3 oz)   SpO2 100%   BMI 24.12 kg/m   Physical Exam: Lying in bed, intermittent cough NAD Prev trach site covered Rhoncorous BS and deep breathing causes her to cough S1S2 No S3 Healed sternotomy scar Abd soft and not tender No edema of LE's Left upper arm AVG with palpable thrill and good quality bruit    Recent Labs Lab 10/04/16 0433 10/05/16 1938 10/07/16 0904 10/08/16 0123 10/09/16 0256  NA 135 135 128* 134* 134*  K 3.4* 3.8 4.0 3.5 4.1  CL 96* 97* 90* 94* 94*  CO2 26 26 23 25 24   GLUCOSE 263* 129* 233* 238* 150*  BUN 42* 24* 96* 34* 60*  CREATININE 2.60* 1.97* 4.24* 2.66* 4.59*  CALCIUM 8.9 8.7* 8.9 9.1 9.1  PHOS  --  1.2* 2.3*  --   --      Recent Labs Lab 10/03/16 1156 10/04/16 0433 10/05/16 1938 10/07/16 0258 10/07/16 0904  WBC 13.0* 14.7* 17.0* 18.3* 17.8*  NEUTROABS 10.1*  --   --   --   --   HGB 9.5* 10.3* 10.6* 10.4* 10.6*  HCT 30.3* 33.4* 33.7* 33.9* 34.3*  MCV 89.9 90.0 89.4 89.9 89.6  PLT 342 320 333 388 390   Medications:    . amiodarone  200 mg Oral BID  . atorvastatin  20 mg Oral q1800  . chlorhexidine  15 mL Mouth Rinse BID  . clopidogrel  75 mg Oral Daily  . darbepoetin (ARANESP) injection - DIALYSIS  100 mcg Intravenous Q Tue-HD  . famotidine  20 mg Oral Daily  . feeding supplement (NEPRO CARB STEADY)  1,000 mL Per Tube Q24H  . feeding supplement (NEPRO CARB STEADY)  237 mL Oral BID BM  . feeding supplement (PRO-STAT SUGAR FREE 64)  30 mL Per Tube TID  . Gerhardt's butt cream   Topical BID  . glycopyrrolate  1 mg Per Tube BID  . guaiFENesin  30 mL Per Tube  Q6H  . hydrocortisone   Rectal BID  . insulin aspart  0-24 Units Subcutaneous Q4H  . insulin aspart  5 Units Subcutaneous TID WC  . insulin detemir  20 Units Subcutaneous BID  . mouth rinse  15 mL Mouth Rinse q12n4p  . midodrine  10 mg Oral q morning - 10a  . multivitamin  1 tablet Oral QHS  . multivitamin  15 mL Per Tube Daily  . warfarin  3 mg Oral q1800  . Warfarin - Physician Dosing Inpatient   Does not apply q1800   Background: 60 y/o AAF with DM2, HTN, HLD presented to [facility] ER with infposterior STEMI on 9/12->cath->. PCS with stent x 2 by Dr. Clayborn Bigness. EF 55-60% with severe MR.  Post-cath respiratory failure ->intubation. Shock and renal failure (creatinine 1.6 on adm). Trialysis catheter placed 9/14 and pt had CRRT at [facility] for 48 hours prior to transfer here on 08/18/16->back to cath lab -> ruptured posterior papillary muscle with severe MR->IABP/[place]. We were asked to see d/t progressive rise in  creatinine/volume overload/anuric AKI .  CRRT re-initiated 08/21/16-9/20, then 9/21 post MVR. Transitioned to IHD.  Dialysis dependent since 08/17/16. No recovery. Presume new ESRD.  Assessment/ Plan:    1. Coronary artery disease status post STEMI and ruptured posterior papillary muscle with cardiogenic shock status post mitral valve replacement on 08/23/16. Amio. Coumadin. Midodrine for BP.  2. 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  Tuesday/Thursday/Saturday schedule via Left tunneled dialysis catheter (9/28 [place]). S/P LUA AVG placed on 10/30 (brachioaxillary) by Dr. Donzetta Matters - maturation in 4 weeks.  3. Anemia: Secondary to anemia of ESRD/critical illness-darbe 100 QTuesday. No iron studies. Will order 4. CKD-MBD: phos has been low <3. No binders. No PTH - will order. 5. Nutrition: Low albumin secondary to critical illness/prolonged hospitalization, ((Nepro/continue renal vitamin) 6. Cough/intermittent dyspnea: Last CXR 11/5 clear, no edema. I presume aspiration is the concern. On Dysphagia  diet.   Jamal Maes, MD [facility] [phone] Pager 10/09/2016, 12:29 PM

## 2016-10-09 NOTE — Progress Notes (Signed)
Physical Therapy Treatment Patient Details Name: Deborah [name] MRN: [id number] DOB: 05/12/1956 Today's Date: 10/09/2016    History of Present Illness 60 yo admitted to [facility] 9/12 with STEMI develped cardiogenic shock with ETT and cRRT initiated on 9/14 after cath, transfer to [facility] 1/03, metabolic acidosis, underwent MVR 9/20, extubated 9/29, reintubated 9/30, 9/25-9/29 hemiplegia developed and resolved, trach and bronchoscopy 10/5. s/p AVG LUE 10/30. PMHx: DM, HTN, HLD    PT Comments    Pt with continued progression with gait, balance and transfers. Cognition and balance continue to limit overall function but improving. Pt educated for all sternal precautions and encouraged increased mobility with nursing assist only. Will continue to follow with CIR still recommended.  Hr 104-110 throughout session sats 95% on 1L  Follow Up Recommendations  CIR;Supervision/Assistance - 24 hour     Equipment Recommendations       Recommendations for Other Services       Precautions / Restrictions Precautions Precautions: Sternal;Fall    Mobility  Bed Mobility Overal bed mobility: Needs Assistance Bed Mobility: Supine to Sit     Supine to sit: Min assist     General bed mobility comments: cues for sequence to roll to side and elevate trunk, min assist for trunk elevation, HOB 20 degrees  Transfers Overall transfer level: Needs assistance   Transfers: Sit to/from Stand Sit to Stand: Min assist Stand pivot transfers: Min assist       General transfer comment: cues for hand placement and anterior translation x 2 trials from bed and 2 from chair. Pt incontinent of stool on arrival and min assist for balance to pivot bed to [facility]  Ambulation/Gait Ambulation/Gait assistance: Mod assist;+2 safety/equipment Ambulation Distance (Feet): 45 Feet Assistive device: Rolling walker (2 wheeled) Gait Pattern/deviations: Step-through pattern;Decreased stride length;Scissoring   Gait velocity  interpretation: Below normal speed for age/gender General Gait Details: pt with variation in being too close and too far posterior in RW with scissoring and left lean at times. Assist for balance, control of RW and cues for safety and sequence. Chair with close follow as pt fatigues and sits with limted warning. Pt walked 45' x 2 trials    Stairs            Wheelchair Mobility    Modified Rankin (Stroke Patients Only)       Balance Overall balance assessment: Needs assistance   Sitting balance-Leahy Scale: Fair       Standing balance-Leahy Scale: Poor                      Cognition Arousal/Alertness: Awake/alert Behavior During Therapy: Flat affect Overall Cognitive Status: Impaired/Different from baseline Area of Impairment: Attention;Following commands;Safety/judgement;Problem solving;Memory     Memory: Decreased recall of precautions;Decreased short-term memory Following Commands: Follows one step commands with increased time Safety/Judgement: Decreased awareness of safety;Decreased awareness of deficits   Problem Solving: Slow processing;Difficulty sequencing;Requires verbal cues;Requires tactile cues General Comments: Pt demonstrates poor safety awareness. unable to recall any precautions before during or after session despite education    Exercises      General Comments        Pertinent Vitals/Pain Pain Assessment: No/denies pain    Home Living                      Prior Function            PT Goals (current goals can now be found in the care plan section)  Progress towards PT goals: Progressing toward goals    Frequency           PT Plan Current plan remains appropriate    Co-evaluation             End of Session Equipment Utilized During Treatment: Gait belt;Oxygen Activity Tolerance: Patient tolerated treatment well Patient left: in chair;with call bell/phone within reach;with chair alarm set     Time:  1583-0940 PT Time Calculation (min) (ACUTE ONLY): 29 min  Charges:  $Gait Training: 8-22 mins $Therapeutic Activity: 8-22 mins                    G Codes:      [name] Oct 30, 2016, 10:46 AM  Elwyn Reach, [place]

## 2016-10-09 NOTE — Progress Notes (Signed)
Occupational Therapy Treatment Patient Details Name: Deborah [name] MRN: [id number] DOB: 12/18/1955 Today's Date: 10/09/2016    History of present illness 60 yo admitted to [facility] 9/12 with STEMI develped cardiogenic shock with ETT and cRRT initiated on 9/14 after cath, transfer to [facility] 0/81, metabolic acidosis, underwent MVR 9/20, extubated 9/29, reintubated 9/30, 9/25-9/29 hemiplegia developed and resolved, trach and bronchoscopy 10/5. s/p AVG Deborah 10/30. PMHx: DM, HTN, HLD   OT comments  Pt demonstrates improving activity tolerance - was able to stand at sink to perform grooming today with min A!.  She requires mod A, overall for ADLs.  Recommend CIR.  Follow Up Recommendations  CIR;Supervision/Assistance - 24 hour    Equipment Recommendations  3 in 1 bedside comode    Recommendations for Other Services Rehab consult    Precautions / Restrictions Precautions Precautions: Sternal;Fall Precaution Comments: panda       Mobility Bed Mobility Overal bed mobility: Needs Assistance Bed Mobility: Supine to Sit;Sit to Supine     Supine to sit: Min guard Sit to supine: Min guard      Transfers Overall transfer level: Needs assistance Equipment used: 1 person hand held assist Transfers: Sit to/from [facility] Sit to Stand: Min assist Stand pivot transfers: Min assist       General transfer comment: cues for hand placement and balance     Balance Overall balance assessment: Needs assistance Sitting-balance support: Feet supported Sitting balance-Leahy Scale: Fair     Standing balance support: During functional activity;Single extremity supported Standing balance-Leahy Scale: Poor Standing balance comment: requires min A                    ADL Overall ADL's : Needs assistance/impaired     Grooming: Wash/dry hands;Wash/dry face;Minimal assistance;Standing               Lower Body Dressing: Moderate assistance;Sit to/from stand   Toilet  Transfer: Moderate assistance;Ambulation;Comfort height toilet;BSC   Toileting- Clothing Manipulation and Hygiene: Maximal assistance;Sit to/from stand       Functional mobility during ADLs: Moderate assistance;Rolling walker General ADL Comments: Pt requires assist for balance and thoroughness       Vision                     Perception     Praxis      Cognition   Behavior During Therapy: Flat affect Overall Cognitive Status: Impaired/Different from baseline Area of Impairment: Attention;Safety/judgement;Problem solving   Current Attention Level: Selective Memory: Decreased recall of precautions;Decreased short-term memory  Following Commands: Follows one step commands consistently Safety/Judgement: Decreased awareness of safety   Problem Solving: Difficulty sequencing;Requires verbal cues;Requires tactile cues      Extremity/Trunk Assessment               Exercises Other Exercises Other Exercises: worked on balance and facilitation of trunk control in standing - requires min facilitation to activate trunk flexors as she tends to overuse back extensors    Shoulder Instructions       General Comments      Pertinent Vitals/ Pain       Pain Assessment: No/denies pain  Home Living                                          Prior Functioning/Environment  Frequency  Min 3X/week        Progress Toward Goals  OT Goals(current goals can now be found in the care plan section)     Acute Rehab OT Goals Patient Stated Goal: to go home  OT Goal Formulation: With patient Time For Goal Achievement: 10/20/16 Potential to Achieve Goals: Good ADL Goals Pt Will Perform Grooming: with supervision;sitting Pt Will Perform Upper Body Bathing: with supervision;sitting Pt Will Perform Lower Body Bathing: with mod assist;sit to/from stand Pt Will Transfer to Toilet: with min assist;stand pivot transfer;bedside commode Pt  Will Perform Toileting - Clothing Manipulation and hygiene: with min assist;sit to/from stand Pt/caregiver will Perform Home Exercise Program: Right Upper extremity;Increased strength;Left upper extremity;With Supervision;With written HEP provided  Plan Discharge plan remains appropriate    Co-evaluation                 End of Session Equipment Utilized During Treatment: Gait belt   Activity Tolerance Patient tolerated treatment well   Patient Left in bed;with call bell/phone within reach;with bed alarm set;with nursing/sitter in room   Nurse Communication Mobility status        Time: 2703 (5009)-3818 OT Time Calculation (min): 15 min  Charges: OT General Charges $OT Visit: 1 Procedure OT Treatments $Self Care/Home Management : 8-22 mins  Ainsley Deakins M 10/09/2016, 4:22 PM

## 2016-10-09 NOTE — Progress Notes (Signed)
Speech Language Pathology Treatment: Dysphagia  Patient Details Name: Deborah [name] MRN: [id number] DOB: 02/15/1956 Today's Date: 10/09/2016 Time: 4650-3546 SLP Time Calculation (min) (ACUTE ONLY): 18 min  Assessment / Plan / Recommendation Clinical Impression  New orders received due to coughing with thickened liquids. Skilled observation complete with patient self feeding am meal. Noted coughing at baseline which continued throughout po trials, appearing unrelated to airway protection. In reviewing most recent MBS, note documentation of coughing throughout testing, also unrelated to single aspiration event. Despite above however, recommend continuation of current diet restrictions including thickened liquids as patient remains mildly SOB during po intake decreasing ability to coordination apneic period with airway closure and increasing aspiration risk. Thin liquids noted on bedside table, removed by SLP and thickened appropriately. Educated patient, CNA, and RN regarding importance of continuing with current recommendations to decrease aspiration risk. Will f/u at bedside. Hopeful for ability to advance with repeat MBS in next 48-72 hours with improved respiratory function.    HPI HPI: 60 yo female admitted to [facility] 08/15/16 with STEMI s/p cath to [place]. Developed cardiogenic shock, VDRF (ETT 9/14-9/29), AKI with metabolic acidosis and started on CRRT 08/17/16. Transferred to [facility] to tx shock and evaluated new severe MR from papillary muscle rupture. She also had fever and found to have Group B Strep in sputum culture.Mitral valve replacement 9/20. Swallow evaluation 9/30 with recs for NPO given poor mentation, severely impaired swallow function s/p prolonged intubation. Reintubated 9/30 secondary to septic shock; trach 10/5; new onset right hemiplegia and right gaze preference.  CT negative for acute changes; dx acute encephalopathy. MBS on 10/23 and Dys 2 with nectar thick liquids using a chin tuck  began.      SLP Plan  Continue with current plan of care     Recommendations  Diet recommendations: Dysphagia 2 (fine chop);Nectar-thick liquid Liquids provided via: Cup;No straw Medication Administration: Whole meds with puree Supervision: Patient able to self feed;Full supervision/cueing for compensatory strategies Compensations: Minimize environmental distractions;Slow rate;Small sips/bites Postural Changes and/or Swallow Maneuvers: Upright 30-60 min after meal;Seated upright 90 degrees                Oral Care Recommendations: Oral care BID Follow up Recommendations: Inpatient Rehab Plan: Continue with current plan of care       [place], [place] [phone]    Deborah [name] Deborah [name] 10/09/2016, 9:39 AM

## 2016-10-10 LAB — GLUCOSE, CAPILLARY
GLUCOSE-CAPILLARY: 146 mg/dL — AB (ref 65–99)
GLUCOSE-CAPILLARY: 44 mg/dL — AB (ref 65–99)
Glucose-Capillary: 130 mg/dL — ABNORMAL HIGH (ref 65–99)
Glucose-Capillary: 208 mg/dL — ABNORMAL HIGH (ref 65–99)
Glucose-Capillary: 51 mg/dL — ABNORMAL LOW (ref 65–99)
Glucose-Capillary: 144 mg/dL — ABNORMAL HIGH (ref 65–99)
Glucose-Capillary: 260 mg/dL — ABNORMAL HIGH (ref 65–99)

## 2016-10-10 LAB — PROTIME-INR
INR: 2.78
Prothrombin Time: 29.9 seconds — ABNORMAL HIGH (ref 11.4–15.2)

## 2016-10-10 LAB — CBC
HCT: 34.6 % — ABNORMAL LOW (ref 36.0–46.0)
Hemoglobin: 11.1 g/dL — ABNORMAL LOW (ref 12.0–15.0)
MCH: 27.8 pg (ref 26.0–34.0)
MCHC: 32.1 g/dL (ref 30.0–36.0)
MCV: 86.7 fL (ref 78.0–100.0)
Platelets: 529 10*3/uL — ABNORMAL HIGH (ref 150–400)
RBC: 3.99 MIL/uL (ref 3.87–5.11)
RDW: 16.2 % — ABNORMAL HIGH (ref 11.5–15.5)
WBC: 18.7 10*3/uL — ABNORMAL HIGH (ref 4.0–10.5)

## 2016-10-10 LAB — RENAL FUNCTION PANEL
ALBUMIN: 2.9 g/dL — AB (ref 3.5–5.0)
Anion gap: 18 — ABNORMAL HIGH (ref 5–15)
BUN: 82 mg/dL — AB (ref 6–20)
CALCIUM: 9.1 mg/dL (ref 8.9–10.3)
CO2: 24 mmol/L (ref 22–32)
CREATININE: 5.88 mg/dL — AB (ref 0.44–1.00)
Chloride: 91 mmol/L — ABNORMAL LOW (ref 101–111)
GFR, EST AFRICAN AMERICAN: 8 mL/min — AB (ref >60)
GFR, EST NON AFRICAN AMERICAN: 7 mL/min — AB (ref >60)
Glucose, Bld: 132 mg/dL — ABNORMAL HIGH (ref 65–99)
PHOSPHORUS: 7.2 mg/dL — AB (ref 2.5–4.6)
Potassium: 4 mmol/L (ref 3.5–5.1)
SODIUM: 133 mmol/L — AB (ref 135–145)

## 2016-10-10 LAB — IRON AND TIBC
IRON: 22 ug/dL — AB (ref 28–170)
Saturation Ratios: 7 % — ABNORMAL LOW (ref 10.4–31.8)
TIBC: 326 ug/dL (ref 250–450)
UIBC: 304 ug/dL

## 2016-10-10 LAB — FERRITIN: Ferritin: 272 ng/mL (ref 11–307)

## 2016-10-10 MED ORDER — DEXTROSE 50 % IV SOLN
INTRAVENOUS | Status: AC
  Administered 2016-10-10: 50 mL
  Filled 2016-10-10: qty 50

## 2016-10-10 MED ORDER — SODIUM CHLORIDE 0.9 % IV SOLN: 250.0000 mg | INTRAVENOUS | Status: DC

## 2016-10-10 MED ORDER — WARFARIN SODIUM 2 MG PO TABS
2.0000 mg | ORAL_TABLET | Freq: Every day | ORAL | Status: DC
  Administered 2016-10-10: 2 mg via ORAL
  Filled 2016-10-10: qty 1

## 2016-10-10 MED ORDER — LANTHANUM CARBONATE 500 MG PO CHEW
1000.0000 mg | CHEWABLE_TABLET | Freq: Three times a day (TID) | ORAL | Status: DC
  Administered 2016-10-10 – 2016-10-11 (×3): 1000 mg via ORAL
  Filled 2016-10-10 (×2): qty 2

## 2016-10-10 MED ORDER — INSULIN DETEMIR 100 UNIT/ML ~~LOC~~ SOLN
10.0000 [IU] | Freq: Two times a day (BID) | SUBCUTANEOUS | Status: DC
  Administered 2016-10-10 – 2016-10-11 (×2): 10 [IU] via SUBCUTANEOUS
  Filled 2016-10-10 (×4): qty 0.1

## 2016-10-10 NOTE — Progress Notes (Signed)
Speech Language Pathology Treatment: Dysphagia  Patient Details Name: Deborah [name] MRN: [id number] DOB: 12/30/1955 Today's Date: 10/10/2016 Time: 1340-1350 SLP Time Calculation (min) (ACUTE ONLY): 10 min  Assessment / Plan / Recommendation Clinical Impression  Ongoing f/u for dysphagia.  Per RN, doing better with meals, tolerated meds well today.  Observed with nectar-thick liquids. She has persisting baseline cough, not necessarily associated with PO consumption per MBS 11/01, but cough raises ongoing concerns that pt is aspirating.  Lungs clear per 11/5 CXR; diminished lung sounds per 11/6 flowsheets.  Continue caution and modified diet, nectar-thick liquids since this consistency offered least risk for aspiration. SLP will follow and determine value of further instrumental studies.        HPI HPI: 60 yo female admitted to [facility] 08/15/16 with STEMI s/p cath to [place]. Developed cardiogenic shock, VDRF (ETT 9/14-9/29), AKI with metabolic acidosis and started on CRRT 08/17/16. Transferred to [facility] to tx shock and evaluated new severe MR from papillary muscle rupture. She also had fever and found to have Group B Strep in sputum culture.Mitral valve replacement 9/20. Swallow evaluation 9/30 with recs for NPO given poor mentation, severely impaired swallow function s/p prolonged intubation. Reintubated 9/30 secondary to septic shock; trach 10/5; new onset right hemiplegia and right gaze preference.  CT negative for acute changes; dx acute encephalopathy. MBS on 10/23 and Dys 2 with nectar thick liquids using a chin tuck began.      SLP Plan  Continue with current plan of care     Recommendations  Diet recommendations: Dysphagia 2 (fine chop);Nectar-thick liquid Liquids provided via: Cup;No straw Medication Administration: Whole meds with puree Supervision: Patient able to self feed;Full supervision/cueing for compensatory strategies Compensations: Minimize environmental distractions;Slow  rate;Small sips/bites Postural Changes and/or Swallow Maneuvers: Upright 30-60 min after meal;Seated upright 90 degrees                Oral Care Recommendations: Oral care BID Follow up Recommendations: Inpatient Rehab Plan: Continue with current plan of care       GO                [name] 10/10/2016, 1:53 PM  Briley Bumgarner L. Tivis Ringer, [place] CCC/SLP Pager [phone]

## 2016-10-10 NOTE — Progress Notes (Signed)
Patient ID: Deborah [name], female   DOB: 1956/10/15, 60 y.o.   MRN: [id number]  [facility] Progress Note   Subjective:   Had dialysis this AM.  Net UF 500 ml 56 kg post HD Well tolerated Still with cough   Objective:   BP 108/62 (BP Location: Right Arm)   Pulse 88   Temp 97.9 F (36.6 C) (Oral)   Resp (!) 22   Ht 5\' 1"  (1.549 m)   Wt 56 kg (123 lb 7.3 oz)   SpO2 97%   BMI 23.33 kg/m   Physical Exam: VS as noted NAD Prev trach site covered Rhoncorous BS  S1S2 No S3 Healed sternotomy scar Abd soft and not tender No edema of LE's Left upper arm AVG with palpable thrill and good quality bruit (10/30) Left sided TDC (9/28 VVS)   Recent Labs Lab 10/04/16 0433 10/05/16 1938 10/07/16 0904 10/08/16 0123 10/09/16 0256 10/10/16 0356  NA 135 135 128* 134* 134* 133*  K 3.4* 3.8 4.0 3.5 4.1 4.0  CL 96* 97* 90* 94* 94* 91*  CO2 26 26 23 25 24 24   GLUCOSE 263* 129* 233* 238* 150* 132*  BUN 42* 24* 96* 34* 60* 82*  CREATININE 2.60* 1.97* 4.24* 2.66* 4.59* 5.88*  CALCIUM 8.9 8.7* 8.9 9.1 9.1 9.1  PHOS  --  1.2* 2.3*  --   --  7.2*   Iron/TIBC/Ferritin/ %Sat    Component Value Date/Time   IRON 22 (L) 10/10/2016 0356   TIBC 326 10/10/2016 0356   FERRITIN 272 10/10/2016 0356   IRONPCTSAT 7 (L) 10/10/2016 0356    Recent Labs Lab 10/05/16 1938 10/07/16 0258 10/07/16 0904 10/10/16 0356  WBC 17.0* 18.3* 17.8* 18.7*  HGB 10.6* 10.4* 10.6* 11.1*  HCT 33.7* 33.9* 34.3* 34.6*  MCV 89.4 89.9 89.6 86.7  PLT 333 388 390 529*   Medications:    . amiodarone  200 mg Oral BID  . atorvastatin  20 mg Oral q1800  . chlorhexidine  15 mL Mouth Rinse BID  . clopidogrel  75 mg Oral Daily  . darbepoetin (ARANESP) injection - DIALYSIS  100 mcg Intravenous Q Tue-HD  . famotidine  20 mg Oral Daily  . feeding supplement (NEPRO CARB STEADY)  1,000 mL Per Tube Q24H  . feeding supplement (NEPRO CARB STEADY)  237 mL Oral BID BM  . feeding supplement (PRO-STAT SUGAR  FREE 64)  30 mL Per Tube TID  . Gerhardt's butt cream   Topical BID  . glycopyrrolate  1 mg Per Tube BID  . guaiFENesin  30 mL Per Tube Q6H  . hydrocortisone   Rectal BID  . insulin aspart  0-24 Units Subcutaneous Q4H  . insulin aspart  5 Units Subcutaneous TID WC  . insulin detemir  10 Units Subcutaneous BID  . mouth rinse  15 mL Mouth Rinse q12n4p  . midodrine  10 mg Oral q morning - 10a  . multivitamin  1 tablet Oral QHS  . multivitamin  15 mL Per Tube Daily  . warfarin  2 mg Oral q1800  . Warfarin - Physician Dosing Inpatient   Does not apply q1800   Background: 60 y/o AAF with DM2, HTN, HLD presented to [facility] ER with infposterior STEMI on 9/12->cath->. PCS with stent x 2 by Dr. Clayborn Bigness. EF 55-60% with severe MR.  Post-cath respiratory failure ->intubation. Shock and renal failure (creatinine 1.6 on adm). Trialysis catheter placed 9/14 and pt had CRRT at [facility] for 48 hours prior  to transfer here on 08/18/16->back to cath lab -> ruptured posterior papillary muscle with severe MR->IABP/[place]. We were asked to see d/t progressive rise in creatinine/volume overload/anuric AKI .  CRRT re-initiated 08/21/16-9/20, then 9/21 post MVR. Transitioned to IHD.  Dialysis dependent since 08/17/16. No recovery. Presume new ESRD.  Assessment/ Plan:    1. 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 Amio. Coumadin. Midodrine for BP.  2. 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  TTS schedule via Left TDC (9/28 Scot Dock). S/P LUA AVG placed on 10/30 (brachioaxillary) by Dr. Donzetta Matters - maturation in 4 weeks. EDW looks to be about 57 kg. 3. Anemia: Secondary to anemia of ESRD/Fe def - on darbe 100 QTuesday. TSat 7%. Fe load with HD.  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 Add non ca based binder - try fosrenol.  PTH pending 5. Nutrition: Low albumin secondary to critical illness/prolonged hospitalization, (Nepro/continue renal vitamin) 6. Cough/intermittent  dyspnea: Last CXR 11/5 clear, no edema. I presume aspiration is the concern. On Dysphagia diet.  Jamal Maes, MD [facility] [phone] Pager 10/10/2016, 12:02 PM

## 2016-10-10 NOTE — Progress Notes (Addendum)
Pt asleep. Woke her to ambulate however she declined, stating she has had a very busy day (HD, low CBG) and too tired to walk right now. Encouraged pt to atleast get to chair later after her nap. Will f/u tomorrow. Yves Dill CES, ACSM 3:04 PM 10/10/2016

## 2016-10-10 NOTE — Progress Notes (Signed)
Hypoglycemic Event  CBG: 44  Treatment: Dextrose 25g  Symptoms: Asymptomatic   Follow-up CBG: Time: 12:26 CBG Result: 208  Possible Reasons for Event: Dialysis     Kathryne Sharper, RN

## 2016-10-10 NOTE — Progress Notes (Addendum)
      [address]       [place] [address]             [phone]        8 Days Post-Op Procedure(s) (LRB): INSERTION OF ARTERIOVENOUS (AV) GORE-TEX GRAFT ARM (Left)  Subjective: Patient continues productive cough but she states it is slightly less. She just returned to her room from HD. She is hungry.  Objective: Vital signs in last 24 hours: Temp:  [97.9 F (36.6 C)-98.4 F (36.9 C)] 97.9 F (36.6 C) (11/07 1015) Pulse Rate:  [80-95] 88 (11/07 1015) Cardiac Rhythm: Normal sinus rhythm (11/06 1919) Resp:  [13-22] 22 (11/07 1015) BP: (92-131)/(46-93) 108/62 (11/07 1015) SpO2:  [97 %-99 %] 97 % (11/07 1015) Weight:  [123 lb 7.3 oz (56 kg)-128 lb 15.5 oz (58.5 kg)] 123 lb 7.3 oz (56 kg) (11/07 1015)  Pre op weight 78 kg Current Weight  10/10/16 123 lb 7.3 oz (56 kg)       Intake/Output from previous day: 11/06 0701 - 11/07 0700 In: 840 [P.O.:840] Out: -    Physical Exam:  Cardiovascular: RRR Pulmonary: Mostly clear Abdomen: Soft, non tender, bowel sounds present. Extremities: No lower extremity edema. Wounds: Clean and dry.  No erythema or signs of infection.  Lab Results: CBC:  Recent Labs  10/10/16 0356  WBC 18.7*  HGB 11.1*  HCT 34.6*  PLT 529*   BMET:   Recent Labs  10/09/16 0256 10/10/16 0356  NA 134* 133*  K 4.1 4.0  CL 94* 91*  CO2 24 24  GLUCOSE 150* 132*  BUN 60* 82*  CREATININE 4.59* 5.88*  CALCIUM 9.1 9.1    PT/INR:  Lab Results  Component Value Date   INR 2.78 10/10/2016   INR 2.10 10/09/2016   INR 1.68 10/08/2016   ABG:  INR: Will add last result for INR, ABG once components are confirmed Will add last 4 CBG results once components are confirmed  Assessment/Plan:  1. CV - S/p STEMI, s/p PCI,s/p MV replacement. On Amiodarone 200 mg bid, Midodrine 10 mg q am, Plavix 75 mg daily,and Coumadin . INR increased from 2.10 to 2.78. Will decrease Coumadin dose. 2.  Pulmonary - On room air. Tessalon Perles  and Robitussin as needed.  Encourage incentive spirometer. 3. DM-CBGs 92/146/130. On Metformin pre op. Only on Insulin post op l. Pre op HGA1C 13.3. Will need close medical follow up after discharge. 4.  Anemia -H and H this am stable at 11.1 and 34.6 5. ESRD- Had HD earlier today. Per Nephrology. 6. GI-Diet is dysphagia 2 (fine chop, nectar thick). Continue with nocturnal TFs and nutrition recommendations Tuesday 7. Was on Cipro but cultures negative so stopped. WBC this am 18,700. Remains afebrile. Monitor pulmonary status (history of PNA, productive cough). Re check CBC in am 8. Hopefully, to CIR soon  ZIMMERMAN,DONIELLE MPA-C 10/10/2016,10:53 AM

## 2016-10-10 NOTE — Progress Notes (Signed)
Spoke with the daughter. She is concerned about the lack of food option for her mother and her mother not eating enough. She would like for her mother to see a dietician and for her dysphagia diet to be reevaluated.

## 2016-10-11 ENCOUNTER — Inpatient Hospital Stay (HOSPITAL_COMMUNITY): Payer: Medicaid Other

## 2016-10-11 ENCOUNTER — Inpatient Hospital Stay (HOSPITAL_COMMUNITY)
Admission: RE | Admit: 2016-10-11 | Discharge: 2016-11-01 | DRG: 947 | Disposition: A | Discharge status: Home Health | Payer: Medicaid Other | Source: Intra-hospital | Attending: Physical Medicine & Rehabilitation | Admitting: Physical Medicine & Rehabilitation

## 2016-10-11 DIAGNOSIS — N390 Urinary tract infection, site not specified: Secondary | ICD-10-CM | POA: Diagnosis not present

## 2016-10-11 DIAGNOSIS — I120 Hypertensive chronic kidney disease with stage 5 chronic kidney disease or end stage renal disease: Secondary | ICD-10-CM

## 2016-10-11 DIAGNOSIS — R053 Chronic cough: Secondary | ICD-10-CM

## 2016-10-11 DIAGNOSIS — R791 Abnormal coagulation profile: Secondary | ICD-10-CM

## 2016-10-11 DIAGNOSIS — R471 Dysarthria and anarthria: Secondary | ICD-10-CM

## 2016-10-11 DIAGNOSIS — D638 Anemia in other chronic diseases classified elsewhere: Secondary | ICD-10-CM

## 2016-10-11 DIAGNOSIS — E785 Hyperlipidemia, unspecified: Secondary | ICD-10-CM | POA: Diagnosis not present

## 2016-10-11 DIAGNOSIS — Z955 Presence of coronary angioplasty implant and graft: Secondary | ICD-10-CM | POA: Diagnosis not present

## 2016-10-11 DIAGNOSIS — R5381 Other malaise: Secondary | ICD-10-CM

## 2016-10-11 DIAGNOSIS — Z9119 Patient's noncompliance with other medical treatment and regimen: Secondary | ICD-10-CM

## 2016-10-11 DIAGNOSIS — R64 Cachexia: Secondary | ICD-10-CM | POA: Diagnosis not present

## 2016-10-11 DIAGNOSIS — D631 Anemia in chronic kidney disease: Secondary | ICD-10-CM

## 2016-10-11 DIAGNOSIS — E1122 Type 2 diabetes mellitus with diabetic chronic kidney disease: Secondary | ICD-10-CM | POA: Diagnosis not present

## 2016-10-11 DIAGNOSIS — N186 End stage renal disease: Secondary | ICD-10-CM

## 2016-10-11 DIAGNOSIS — J40 Bronchitis, not specified as acute or chronic: Secondary | ICD-10-CM

## 2016-10-11 DIAGNOSIS — I480 Paroxysmal atrial fibrillation: Secondary | ICD-10-CM | POA: Diagnosis not present

## 2016-10-11 DIAGNOSIS — I252 Old myocardial infarction: Secondary | ICD-10-CM

## 2016-10-11 DIAGNOSIS — E11649 Type 2 diabetes mellitus with hypoglycemia without coma: Secondary | ICD-10-CM

## 2016-10-11 DIAGNOSIS — Z992 Dependence on renal dialysis: Secondary | ICD-10-CM | POA: Diagnosis not present

## 2016-10-11 DIAGNOSIS — G9341 Metabolic encephalopathy: Secondary | ICD-10-CM | POA: Diagnosis present

## 2016-10-11 DIAGNOSIS — R1312 Dysphagia, oropharyngeal phase: Secondary | ICD-10-CM | POA: Diagnosis not present

## 2016-10-11 DIAGNOSIS — J9503 Malfunction of tracheostomy stoma: Secondary | ICD-10-CM

## 2016-10-11 DIAGNOSIS — R490 Dysphonia: Secondary | ICD-10-CM

## 2016-10-11 DIAGNOSIS — I2510 Atherosclerotic heart disease of native coronary artery without angina pectoris: Secondary | ICD-10-CM

## 2016-10-11 DIAGNOSIS — R05 Cough: Secondary | ICD-10-CM

## 2016-10-11 DIAGNOSIS — N2581 Secondary hyperparathyroidism of renal origin: Secondary | ICD-10-CM | POA: Diagnosis not present

## 2016-10-11 DIAGNOSIS — Z79899 Other long term (current) drug therapy: Secondary | ICD-10-CM

## 2016-10-11 DIAGNOSIS — R059 Cough, unspecified: Secondary | ICD-10-CM

## 2016-10-11 DIAGNOSIS — Z930 Tracheostomy status: Secondary | ICD-10-CM | POA: Diagnosis not present

## 2016-10-11 DIAGNOSIS — E118 Type 2 diabetes mellitus with unspecified complications: Secondary | ICD-10-CM

## 2016-10-11 DIAGNOSIS — R0602 Shortness of breath: Secondary | ICD-10-CM

## 2016-10-11 DIAGNOSIS — Z7982 Long term (current) use of aspirin: Secondary | ICD-10-CM | POA: Diagnosis not present

## 2016-10-11 DIAGNOSIS — Z7901 Long term (current) use of anticoagulants: Secondary | ICD-10-CM | POA: Diagnosis not present

## 2016-10-11 DIAGNOSIS — R7309 Other abnormal glucose: Secondary | ICD-10-CM

## 2016-10-11 DIAGNOSIS — G479 Sleep disorder, unspecified: Secondary | ICD-10-CM

## 2016-10-11 DIAGNOSIS — E1165 Type 2 diabetes mellitus with hyperglycemia: Secondary | ICD-10-CM

## 2016-10-11 DIAGNOSIS — Z794 Long term (current) use of insulin: Secondary | ICD-10-CM

## 2016-10-11 DIAGNOSIS — Z953 Presence of xenogenic heart valve: Secondary | ICD-10-CM

## 2016-10-11 DIAGNOSIS — I4892 Unspecified atrial flutter: Secondary | ICD-10-CM

## 2016-10-11 DIAGNOSIS — IMO0002 Reserved for concepts with insufficient information to code with codable children: Secondary | ICD-10-CM

## 2016-10-11 DIAGNOSIS — D72829 Elevated white blood cell count, unspecified: Secondary | ICD-10-CM

## 2016-10-11 DIAGNOSIS — J9621 Acute and chronic respiratory failure with hypoxia: Secondary | ICD-10-CM

## 2016-10-11 DIAGNOSIS — Z952 Presence of prosthetic heart valve: Secondary | ICD-10-CM

## 2016-10-11 DIAGNOSIS — F419 Anxiety disorder, unspecified: Secondary | ICD-10-CM

## 2016-10-11 DIAGNOSIS — J9859 Other diseases of mediastinum, not elsewhere classified: Secondary | ICD-10-CM

## 2016-10-11 DIAGNOSIS — M898X9 Other specified disorders of bone, unspecified site: Secondary | ICD-10-CM

## 2016-10-11 DIAGNOSIS — E876 Hypokalemia: Secondary | ICD-10-CM

## 2016-10-11 LAB — BASIC METABOLIC PANEL
Anion gap: 13 (ref 5–15)
BUN: 27 mg/dL — AB (ref 6–20)
CHLORIDE: 93 mmol/L — AB (ref 101–111)
CO2: 23 mmol/L (ref 22–32)
CREATININE: 4.2 mg/dL — AB (ref 0.44–1.00)
Calcium: 8.4 mg/dL — ABNORMAL LOW (ref 8.9–10.3)
GFR, EST AFRICAN AMERICAN: 12 mL/min — AB (ref >60)
GFR, EST NON AFRICAN AMERICAN: 11 mL/min — AB (ref >60)
Glucose, Bld: 195 mg/dL — ABNORMAL HIGH (ref 65–99)
POTASSIUM: 4.6 mmol/L (ref 3.5–5.1)
SODIUM: 129 mmol/L — AB (ref 135–145)

## 2016-10-11 LAB — RENAL FUNCTION PANEL
Albumin: 2.6 g/dL — ABNORMAL LOW (ref 3.5–5.0)
Anion gap: 13 (ref 5–15)
BUN: 22 mg/dL — AB (ref 6–20)
CHLORIDE: 94 mmol/L — AB (ref 101–111)
CO2: 26 mmol/L (ref 22–32)
CREATININE: 3.44 mg/dL — AB (ref 0.44–1.00)
Calcium: 8.5 mg/dL — ABNORMAL LOW (ref 8.9–10.3)
GFR calc Af Amer: 16 mL/min — ABNORMAL LOW (ref >60)
GFR, EST NON AFRICAN AMERICAN: 14 mL/min — AB (ref >60)
GLUCOSE: 53 mg/dL — AB (ref 65–99)
POTASSIUM: 3.4 mmol/L — AB (ref 3.5–5.1)
Phosphorus: 5.3 mg/dL — ABNORMAL HIGH (ref 2.5–4.6)
Sodium: 133 mmol/L — ABNORMAL LOW (ref 135–145)

## 2016-10-11 LAB — GLUCOSE, CAPILLARY
GLUCOSE-CAPILLARY: 150 mg/dL — AB (ref 65–99)
GLUCOSE-CAPILLARY: 96 mg/dL (ref 65–99)
GLUCOSE-CAPILLARY: 192 mg/dL — AB (ref 65–99)
GLUCOSE-CAPILLARY: 253 mg/dL — AB (ref 65–99)
Glucose-Capillary: 169 mg/dL — ABNORMAL HIGH (ref 65–99)
Glucose-Capillary: 194 mg/dL — ABNORMAL HIGH (ref 65–99)
Glucose-Capillary: 55 mg/dL — ABNORMAL LOW (ref 65–99)

## 2016-10-11 LAB — CBC
HEMATOCRIT: 33.5 % — AB (ref 36.0–46.0)
Hemoglobin: 10.3 g/dL — ABNORMAL LOW (ref 12.0–15.0)
MCH: 27 pg (ref 26.0–34.0)
MCHC: 30.7 g/dL (ref 30.0–36.0)
MCV: 87.9 fL (ref 78.0–100.0)
PLATELETS: 475 10*3/uL — AB (ref 150–400)
RBC: 3.81 MIL/uL — ABNORMAL LOW (ref 3.87–5.11)
RDW: 16.2 % — AB (ref 11.5–15.5)
WBC: 15.1 10*3/uL — AB (ref 4.0–10.5)

## 2016-10-11 LAB — PROTIME-INR
INR: 3.35
PROTHROMBIN TIME: 34.8 s — AB (ref 11.4–15.2)

## 2016-10-11 LAB — PARATHYROID HORMONE, INTACT (NO CA): PTH: 199 pg/mL — AB (ref 15–65)

## 2016-10-11 MED ORDER — ALUM & MAG HYDROXIDE-SIMETH 200-200-20 MG/5ML PO SUSP: 30.0000 mL | ORAL | Status: DC | PRN

## 2016-10-11 MED ORDER — CLOPIDOGREL BISULFATE 75 MG PO TABS
75.0000 mg | ORAL_TABLET | Freq: Every day | ORAL | Status: DC
  Administered 2016-10-12 – 2016-11-01 (×21): 75 mg via ORAL
  Filled 2016-10-11 (×21): qty 1

## 2016-10-11 MED ORDER — MAGNESIUM HYDROXIDE 400 MG/5ML PO SUSP: 30.0000 mL | Freq: Every day | ORAL | Status: DC | PRN

## 2016-10-11 MED ORDER — GERHARDT'S BUTT CREAM
TOPICAL_CREAM | Freq: Two times a day (BID) | CUTANEOUS | Status: DC
  Administered 2016-10-11 – 2016-10-27 (×25): via TOPICAL
  Administered 2016-10-27 – 2016-10-28 (×3): 1 via TOPICAL
  Administered 2016-10-30 – 2016-11-01 (×4): via TOPICAL
  Filled 2016-10-11: qty 1

## 2016-10-11 MED ORDER — DARBEPOETIN ALFA 100 MCG/0.5ML IJ SOSY
100.0000 ug | PREFILLED_SYRINGE | INTRAMUSCULAR | Status: DC
  Filled 2016-10-11: qty 0.5

## 2016-10-11 MED ORDER — BENZONATATE 100 MG PO CAPS: 100.0000 mg | ORAL_CAPSULE | Freq: Three times a day (TID) | ORAL | 0 refills | Status: DC

## 2016-10-11 MED ORDER — INSULIN DETEMIR 100 UNIT/ML ~~LOC~~ SOLN
5.0000 [IU] | Freq: Two times a day (BID) | SUBCUTANEOUS | Status: DC
  Administered 2016-10-11 – 2016-10-12 (×3): 5 [IU] via SUBCUTANEOUS
  Filled 2016-10-11 (×5): qty 0.05

## 2016-10-11 MED ORDER — HYDROCORTISONE 2.5 % RE CREA
TOPICAL_CREAM | Freq: Two times a day (BID) | RECTAL | Status: DC
  Administered 2016-10-11 – 2016-10-25 (×17): via RECTAL
  Administered 2016-10-26: 1 via RECTAL
  Administered 2016-10-26: 22:00:00 via RECTAL
  Administered 2016-10-27 – 2016-10-28 (×3): 1 via RECTAL
  Administered 2016-10-30 – 2016-11-01 (×3): via RECTAL
  Filled 2016-10-11: qty 28.35

## 2016-10-11 MED ORDER — INSULIN ASPART 100 UNIT/ML ~~LOC~~ SOLN
0.0000 [IU] | Freq: Every day | SUBCUTANEOUS | Status: DC
  Administered 2016-10-11: 3 [IU] via SUBCUTANEOUS

## 2016-10-11 MED ORDER — DIPHENHYDRAMINE HCL 12.5 MG/5ML PO LIQD
12.5000 mg | Freq: Every day | ORAL | Status: DC | PRN
  Filled 2016-10-11: qty 5

## 2016-10-11 MED ORDER — ACETAMINOPHEN 325 MG PO TABS: 325.0000 mg | ORAL_TABLET | ORAL | Status: DC | PRN

## 2016-10-11 MED ORDER — WARFARIN - PHYSICIAN DOSING INPATIENT: Freq: Every day | Status: DC

## 2016-10-11 MED ORDER — DIPHENHYDRAMINE HCL 12.5 MG/5ML PO ELIX: 12.5000 mg | ORAL_SOLUTION | Freq: Four times a day (QID) | ORAL | Status: DC | PRN

## 2016-10-11 MED ORDER — HYDROCODONE-ACETAMINOPHEN 7.5-325 MG/15ML PO SOLN
15.0000 mL | ORAL | Status: DC | PRN
Start: 2016-10-11 — End: 2016-10-13

## 2016-10-11 MED ORDER — SODIUM CHLORIDE 0.9% FLUSH
10.0000 mL | Freq: Two times a day (BID) | INTRAVENOUS | Status: DC
  Administered 2016-10-12 – 2016-11-01 (×4): 10 mL

## 2016-10-11 MED ORDER — AMIODARONE HCL 200 MG PO TABS
200.0000 mg | ORAL_TABLET | Freq: Two times a day (BID) | ORAL | Status: DC
  Administered 2016-10-11 – 2016-10-24 (×25): 200 mg via ORAL
  Filled 2016-10-11 (×25): qty 1

## 2016-10-11 MED ORDER — NEPRO/CARBSTEADY PO LIQD: 237.0000 mL | Freq: Every day | ORAL | Status: DC

## 2016-10-11 MED ORDER — FLEET ENEMA 7-19 GM/118ML RE ENEM: 1.0000 | ENEMA | Freq: Once | RECTAL | Status: DC | PRN

## 2016-10-11 MED ORDER — ATORVASTATIN CALCIUM 20 MG PO TABS
20.0000 mg | ORAL_TABLET | Freq: Every day | ORAL | Status: DC
  Administered 2016-10-11 – 2016-11-01 (×21): 20 mg via ORAL
  Filled 2016-10-11 (×22): qty 1

## 2016-10-11 MED ORDER — INSULIN ASPART 100 UNIT/ML ~~LOC~~ SOLN
0.0000 [IU] | Freq: Three times a day (TID) | SUBCUTANEOUS | Status: DC
  Administered 2016-10-12: 1 [IU] via SUBCUTANEOUS
  Administered 2016-10-12 – 2016-10-13 (×2): 2 [IU] via SUBCUTANEOUS
  Administered 2016-10-13: 3 [IU] via SUBCUTANEOUS
  Administered 2016-10-13 – 2016-10-14 (×3): 2 [IU] via SUBCUTANEOUS
  Administered 2016-10-14 – 2016-10-15 (×2): 3 [IU] via SUBCUTANEOUS
  Administered 2016-10-15: 2 [IU] via SUBCUTANEOUS
  Administered 2016-10-16: 5 [IU] via SUBCUTANEOUS
  Administered 2016-10-16: 2 [IU] via SUBCUTANEOUS
  Administered 2016-10-17: 1 [IU] via SUBCUTANEOUS
  Administered 2016-10-18 – 2016-10-20 (×3): 3 [IU] via SUBCUTANEOUS
  Administered 2016-10-20: 1 [IU] via SUBCUTANEOUS
  Administered 2016-10-21: 5 [IU] via SUBCUTANEOUS
  Administered 2016-10-22: 2 [IU] via SUBCUTANEOUS
  Administered 2016-10-23: 5 [IU] via SUBCUTANEOUS

## 2016-10-11 MED ORDER — FAMOTIDINE 40 MG/5ML PO SUSR
20.0000 mg | Freq: Every day | ORAL | Status: DC
  Administered 2016-10-12 – 2016-10-30 (×18): 20 mg via ORAL
  Filled 2016-10-11 (×19): qty 2.5

## 2016-10-11 MED ORDER — BISACODYL 10 MG RE SUPP: 10.0000 mg | Freq: Every day | RECTAL | Status: DC | PRN

## 2016-10-11 MED ORDER — WARFARIN SODIUM 2.5 MG PO TABS: 2.5000 mg | ORAL_TABLET | Freq: Every day | ORAL | 1 refills | Status: DC

## 2016-10-11 MED ORDER — BISACODYL 10 MG RE SUPP
10.0000 mg | Freq: Every day | RECTAL | Status: DC | PRN
  Filled 2016-10-11 (×2): qty 1

## 2016-10-11 MED ORDER — MIDODRINE HCL 5 MG PO TABS
10.0000 mg | ORAL_TABLET | Freq: Every morning | ORAL | Status: DC
  Administered 2016-10-12 – 2016-11-01 (×21): 10 mg via ORAL
  Filled 2016-10-11 (×23): qty 2

## 2016-10-11 MED ORDER — GUAIFENESIN-DM 100-10 MG/5ML PO SYRP: 5.0000 mL | ORAL_SOLUTION | Freq: Four times a day (QID) | ORAL | Status: DC | PRN

## 2016-10-11 MED ORDER — PROCHLORPERAZINE 25 MG RE SUPP: 12.5000 mg | Freq: Four times a day (QID) | RECTAL | Status: DC | PRN

## 2016-10-11 MED ORDER — WARFARIN SODIUM 2.5 MG PO TABS: 2.5000 mg | ORAL_TABLET | Freq: Every day | ORAL | Status: DC

## 2016-10-11 MED ORDER — TRAZODONE HCL 50 MG PO TABS
25.0000 mg | ORAL_TABLET | Freq: Every evening | ORAL | Status: DC | PRN
  Administered 2016-11-01: 50 mg via ORAL
  Filled 2016-10-11: qty 1

## 2016-10-11 MED ORDER — INSULIN DETEMIR 100 UNIT/ML ~~LOC~~ SOLN: 10.0000 [IU] | Freq: Two times a day (BID) | SUBCUTANEOUS | 11 refills | Status: DC

## 2016-10-11 MED ORDER — SODIUM CHLORIDE 0.9 % IV SOLN
250.0000 mg | INTRAVENOUS | Status: AC
  Administered 2016-10-12 – 2016-10-19 (×3): 250 mg via INTRAVENOUS
  Filled 2016-10-11 (×7): qty 20

## 2016-10-11 MED ORDER — GLYCOPYRROLATE 1 MG PO TABS: 1.0000 mg | ORAL_TABLET | Freq: Two times a day (BID) | ORAL | 1 refills | Status: DC

## 2016-10-11 MED ORDER — PROCHLORPERAZINE EDISYLATE 5 MG/ML IJ SOLN: 5.0000 mg | Freq: Four times a day (QID) | INTRAMUSCULAR | Status: DC | PRN

## 2016-10-11 MED ORDER — WARFARIN SODIUM 5 MG PO TABS: 2.5000 mg | ORAL_TABLET | Freq: Every day | ORAL | Status: DC

## 2016-10-11 MED ORDER — GUAIFENESIN-DM 100-10 MG/5ML PO SYRP
5.0000 mL | ORAL_SOLUTION | Freq: Four times a day (QID) | ORAL | Status: DC | PRN
  Administered 2016-10-11: 10 mL via ORAL
  Administered 2016-10-12: 5 mL via ORAL
  Administered 2016-10-13 – 2016-10-20 (×4): 10 mL via ORAL
  Administered 2016-10-26: 5 mL via ORAL
  Filled 2016-10-11 (×5): qty 10

## 2016-10-11 MED ORDER — PROCHLORPERAZINE MALEATE 5 MG PO TABS
5.0000 mg | ORAL_TABLET | Freq: Four times a day (QID) | ORAL | Status: DC | PRN
  Administered 2016-10-29: 10 mg via ORAL
  Filled 2016-10-11: qty 2

## 2016-10-11 MED ORDER — INSULIN ASPART 100 UNIT/ML ~~LOC~~ SOLN: 0.0000 [IU] | SUBCUTANEOUS | 11 refills | Status: DC

## 2016-10-11 MED ORDER — BENZONATATE 100 MG PO CAPS
100.0000 mg | ORAL_CAPSULE | Freq: Three times a day (TID) | ORAL | Status: AC
  Administered 2016-10-11 – 2016-10-15 (×12): 100 mg via ORAL
  Filled 2016-10-11 (×12): qty 1

## 2016-10-11 MED ORDER — GUAIFENESIN 100 MG/5ML PO SOLN
30.0000 mL | Freq: Four times a day (QID) | ORAL | Status: DC
  Administered 2016-10-12 – 2016-10-15 (×8): 600 mg
  Filled 2016-10-11: qty 30
  Filled 2016-10-11: qty 15
  Filled 2016-10-11 (×2): qty 30
  Filled 2016-10-11: qty 15
  Filled 2016-10-11: qty 30
  Filled 2016-10-11: qty 45
  Filled 2016-10-11: qty 30
  Filled 2016-10-11: qty 15
  Filled 2016-10-11 (×4): qty 30
  Filled 2016-10-11: qty 15

## 2016-10-11 MED ORDER — ALBUTEROL SULFATE (2.5 MG/3ML) 0.083% IN NEBU
2.5000 mg | INHALATION_SOLUTION | RESPIRATORY_TRACT | Status: DC | PRN
  Administered 2016-10-12: 2.5 mg via RESPIRATORY_TRACT
  Filled 2016-10-11 (×2): qty 3

## 2016-10-11 MED ORDER — GLYCOPYRROLATE 1 MG PO TABS
1.0000 mg | ORAL_TABLET | Freq: Two times a day (BID) | ORAL | Status: DC
  Administered 2016-10-11 – 2016-10-17 (×10): 1 mg
  Filled 2016-10-11 (×13): qty 1

## 2016-10-11 MED ORDER — LANTHANUM CARBONATE 500 MG PO CHEW
1000.0000 mg | CHEWABLE_TABLET | Freq: Three times a day (TID) | ORAL | Status: DC
  Administered 2016-10-12 – 2016-11-01 (×56): 1000 mg via ORAL
  Filled 2016-10-11 (×56): qty 2

## 2016-10-11 MED ORDER — WARFARIN SODIUM 2.5 MG PO TABS
2.5000 mg | ORAL_TABLET | Freq: Every day | ORAL | Status: DC
Start: 2016-10-12 — End: 2016-10-11

## 2016-10-11 MED ORDER — ADULT MULTIVITAMIN LIQUID CH
15.0000 mL | Freq: Every day | ORAL | Status: DC
  Administered 2016-10-12 – 2016-10-16 (×5): 15 mL
  Filled 2016-10-11 (×6): qty 15

## 2016-10-11 MED ORDER — HYDROCOD POLST-CPM POLST ER 10-8 MG/5ML PO SUER
5.0000 mL | Freq: Every evening | ORAL | Status: DC | PRN
Start: 2016-10-11 — End: 2016-10-17
  Administered 2016-10-12 – 2016-10-16 (×4): 5 mL via ORAL
  Filled 2016-10-11 (×4): qty 5

## 2016-10-11 MED ORDER — DEXTROSE 50 % IV SOLN
INTRAVENOUS | Status: AC
  Administered 2016-10-11: 25 mL
  Filled 2016-10-11: qty 50

## 2016-10-11 MED ORDER — NEPRO/CARBSTEADY PO LIQD
1000.0000 mL | ORAL | Status: DC
  Filled 2016-10-11 (×2): qty 1000

## 2016-10-11 MED ORDER — RENA-VITE PO TABS
1.0000 | ORAL_TABLET | Freq: Every day | ORAL | Status: DC
  Administered 2016-10-11 – 2016-10-31 (×19): 1 via ORAL
  Filled 2016-10-11 (×19): qty 1

## 2016-10-11 MED ORDER — TRAZODONE HCL 50 MG PO TABS: 25.0000 mg | ORAL_TABLET | Freq: Every evening | ORAL | Status: DC | PRN

## 2016-10-11 MED ORDER — K PHOS MONO-SOD PHOS DI & MONO 155-852-130 MG PO TABS
500.0000 mg | ORAL_TABLET | Freq: Two times a day (BID) | ORAL | Status: AC
  Administered 2016-10-11 – 2016-10-12 (×3): 500 mg via ORAL
  Filled 2016-10-11 (×3): qty 2

## 2016-10-11 MED ORDER — CHLORHEXIDINE GLUCONATE 0.12 % MT SOLN
15.0000 mL | Freq: Two times a day (BID) | OROMUCOSAL | Status: DC
  Administered 2016-10-11 – 2016-10-12 (×3): 15 mL via OROMUCOSAL
  Filled 2016-10-11 (×4): qty 15

## 2016-10-11 MED ORDER — SODIUM CHLORIDE 0.9 % IV SOLN: 250.0000 mg | INTRAVENOUS | 1 refills | Status: DC

## 2016-10-11 MED ORDER — DIPHENHYDRAMINE HCL 12.5 MG/5ML PO LIQD
12.5000 mg | Freq: Every evening | ORAL | Status: DC | PRN
  Filled 2016-10-11: qty 5

## 2016-10-11 MED ORDER — POTASSIUM CHLORIDE CRYS ER 20 MEQ PO TBCR
20.0000 meq | EXTENDED_RELEASE_TABLET | Freq: Once | ORAL | Status: AC
  Administered 2016-10-11: 20 meq via ORAL
  Filled 2016-10-11: qty 1

## 2016-10-11 MED ORDER — SODIUM CHLORIDE 0.9% FLUSH: 10.0000 mL | INTRAVENOUS | Status: DC | PRN

## 2016-10-11 MED ORDER — WARFARIN - PHARMACIST DOSING INPATIENT
Freq: Every day | Status: DC
  Administered 2016-10-23 – 2016-11-01 (×5)

## 2016-10-11 MED ORDER — NEPRO/CARBSTEADY PO LIQD
237.0000 mL | Freq: Every day | ORAL | Status: DC
  Filled 2016-10-11: qty 237

## 2016-10-11 MED ORDER — LANTHANUM CARBONATE 1000 MG PO CHEW: 1000.0000 mg | CHEWABLE_TABLET | Freq: Three times a day (TID) | ORAL | 0 refills | Status: DC

## 2016-10-11 MED ORDER — RENA-VITE PO TABS: 1.0000 | ORAL_TABLET | Freq: Every day | ORAL | 0 refills | Status: DC

## 2016-10-11 MED ORDER — PROCHLORPERAZINE MALEATE 5 MG PO TABS: 5.0000 mg | ORAL_TABLET | Freq: Four times a day (QID) | ORAL | Status: DC | PRN

## 2016-10-11 MED ORDER — NEPRO/CARBSTEADY PO LIQD: 237.0000 mL | Freq: Every day | ORAL | 0 refills | Status: DC

## 2016-10-11 NOTE — Discharge Summary (Signed)
Physician Discharge Summary  Patient ID: Deborah [name] MRN: [id number] DOB/AGE: 59/27/1957 59 y.o.  Admit date: 08/18/2016 Discharge date: 10/11/2016  Admission Diagnoses: Patient Active Problem List   Diagnosis Date Noted  . Diabetes mellitus type 2 in nonobese ([name])   . Stage 3 chronic kidney disease   . NSTEMI (non-ST elevated myocardial infarction) ([place])   . Tachypnea   . Hyponatremia   . Lymphocytosis   . Acute blood loss anemia   . Pressure injury of skin 09/18/2016  . Tracheostomy status ([place])   . Delirium   . Encounter for attention to tracheostomy ([place])   . History of MVR with cardiopulmonary bypass   . Systolic congestive heart failure ([place])   . HCAP (healthcare-associated pneumonia)   . PAF (paroxysmal atrial fibrillation) ([place])   . Acute systolic congestive heart failure ([place])   . AKI (acute kidney injury) ([place])   . Abnormal LFTs   . S/P MVR (mitral valve replacement)   . Mitral regurgitation 08/23/2016  . Acute on chronic diastolic heart failure ([place]) 08/18/2016  . Acute pulmonary edema ([name])   . Respiratory failure ([place]) 08/17/2016  . Cardiogenic shock ([place]) 08/17/2016  . Acute hypoxemic respiratory failure ([place])   . STEMI (ST elevation myocardial infarction) ([place]) 08/15/2016  . Noncompliance 01/25/2015  . Bereavement 07/09/2014  . Low back pain 02/17/2014  . Cervical cancer screening 11/20/2013  . Routine general medical examination at a health care facility 10/14/2013  . Chronic kidney disease, stage 3 06/14/2012  . Hypertension 03/08/2012  . Diabetes mellitus type 2, controlled 03/08/2012  . Hyperlipidemia 03/08/2012    Discharge Diagnoses:  Principal Problem:   Cardiogenic shock ([place]) Active Problems:   Acute on chronic diastolic heart failure ([name])   Acute pulmonary edema ([name])   Mitral regurgitation   Abnormal LFTs   S/P MVR (mitral valve replacement)   AKI (acute kidney injury) ([place])   Acute systolic congestive heart failure ([name])   PAF  (paroxysmal atrial fibrillation) ([name])   Systolic congestive heart failure ([place])   HCAP (healthcare-associated pneumonia)   History of MVR with cardiopulmonary bypass   Encounter for attention to tracheostomy ([place])   Pressure injury of skin   Tracheostomy status ([place])   Delirium   Diabetes mellitus type 2 in nonobese ([place])   Stage 3 chronic kidney disease   NSTEMI (non-ST elevated myocardial infarction) ([place])   Tachypnea   Hyponatremia   Lymphocytosis   Acute blood loss anemia   Discharged Condition: good  HPI:  Patient is a 60 year old female who was transferred to [facility] after presenting at [facility] on 9/12 within the inferoposterior STEMI. She is taken to the cath lab and found to have an occluded small to moderate OM branch. She underwent successful PTCA and stent times 2 by Dr. Towanda Malkin. He has her coronary arteries were felt to be okay. Ejection fraction was 55-60% and severe mitral regurgitation was confirmed by echocardiogram. During the post catheterization. She developed respiratory failure and required intubation. She developed shock and renal failure with a peak creatinine of 3.4. He try dialysis catheter was placed. The patient was persistently acidotic and concerns were for ischemic mitral regurgitation and was therefore transferred to [facility] for further management. During route to the hospital she developed severe hypotension and norepinephrine was started.  Hospital Course: She was brought to the cardiac catheterization lab and a TEE was performed. This showed normal LV and RV function with ruptured posterior papillary muscle severe  posterior mitral regurgitation. A Swan and intra-aortic balloon pump placed. She was seen in cardiothoracic surgical consultation by Dr.Van Trigt. She was felt to be inadequate today for surgery however her platelets were dysfunctional by P2 Y 12 assay. She was monitored daily for medical optimization to  proceed with surgery. Care was assisted by nephrology as well as critical care medicine. She was treated for significant leukocytosis and there were also pulmonary findings were felt to require Antibiotics. She'll require CRRT for significant volume overload. She remained on the ventilator for respiratory failure. She did require some inotropic support. She was overall felt to be stable for proceeding with her mitral valve surgery as described below on 08/23/2016.  Postoperative hospital course:  Postoperatively she has had a complex course. She did require reintubation for septic shock due to staph bacteremia and again required pressors support. She developed atrial flutter and underwent TEE DCCV  on 09/04/2016. She's been placed on Coumadin and amiodarone for sinus tach with PAF. She underwent a tracheostomy on 09/07/2016 and has been weaned to the ATC. Her hospital acquired pneumonia was treated and the organism was Enterobacter. She had a modified barium swallow showing silent penetration and she is on a dysphagia 2 diet with nectar liquids. She's been treated aggressively by the nephrologist and has a new left upper extremity AV graft as well as a formal dialysis catheter. She is HD dependent. She has required some midodrine  for blood pressure. This is improving. Originally she was rejected from CIR due to her WBC count and unclear antibiotic regimen. Her white count is trending down and her Cipro was discontinued due to no evidence of pneumonia. She is felt to be stable for transfer to the CIR unit for further rehabilitation. Incisions are healing well. All routine lines, monitors and drainage devices have been discontinued including her NG tube. Coumadin supratherapeutic today therefore holding coumadin. Dr. Prescott Gum recommends 2.5mg  daily starting tomorrow with daily INR draws and appropriate titration. Cleared medically by Dr. Prescott Gum for CIR today. Will need aggressive inpatient rehab with PT,OT,  and speech.   Consults: cardiology, pulmonary/intensive care and nephrology  Significant Diagnostic Studies: TEE  Treatments:  DATE OF PROCEDURE: 08/23/2016 DATE OF DISCHARGE:  OPERATIVE REPORT   OPERATION: 1. Mitral valve replacement with a 25 mm pericardial [facility] Ease valve serial [id number]. 2. Endoscopic harvest of left leg greater saphenous vein. 3. Placement of right subclavian vein triple-lumen catheter for IV access.  SURGEON: Ivin Poot, MD  ASSISTANT: Nicholes Rough, PA-C  ANESTHESIA: General by Midge Minium, MD  PREOPERATIVE DIAGNOSES: 1. Acute myocardial infarction, ischemic mitral regurgitation from infarcted posterior papillary muscle, cardiogenic shock, requiring intubation, balloon pump, and subsequent dialysis prior to surgery. 2. Single-vessel coronary artery disease of a small OM1, treated with percutaneous coronary intervention and drug-eluting stent by Dr. Clayborn Bigness.  POSTOPERATIVE DIAGNOSES: 1. Acute myocardial infarction, ischemic mitral regurgitation from infarcted posterior papillary muscle, cardiogenic shock, requiring intubation, balloon pump, and subsequent dialysis prior to surgery. 2. Single-vessel coronary artery disease of a small OM1, treated with percutaneous coronary intervention and drug-eluting stent by Dr. Clayborn Bigness.  NAME:Ebony S WyattMRN:[id number] DOB:09/25/1956   DATE OF OPERATION:08/31/2016  PREOP DIAGNOSIS:Chronic kidney disease  POSTOP DIAGNOSIS:Same  PROCEDURE: 1. Placement of 28 cm left IJ tunneled dialysis catheter 2. Ultrasound-guided placement of right IJ triple lumen catheter 3. Removal of right subclavian vein triple-lumen catheter  SURGEON:Christopher S. Scot Dock, MD, FACS  ASSIST:None  ANESTHESIA:Local with  sedation  PROCEDURE: Left upper arm  brachial-axillary graft with 4-28mm ptfe  PRE-OPERATIVE DIAGNOSIS: esrd  POST-OPERATIVE DIAGNOSIS: same  SURGEON: Brandon C. Donzetta Matters, MD  ANESTHESIA: local and MAC  ESTIMATED BLOOD LOSS: 25cc  Discharge Exam: Blood pressure (!) 119/59, pulse 84, temperature 98.4 F (36.9 C), temperature source Oral, resp. rate 18, height 5\' 1"  (1.549 m), weight 128 lb 1.4 oz (58.1 kg), SpO2 98 %.   General appearance: alert, cooperative and no distress Heart: regular rate and rhythm Lungs: clear to auscultation bilaterally Abdomen: soft, non-tender; bowel sounds normal; no masses,  no organomegaly Extremities: extremities normal, atraumatic, no cyanosis or edema Wound: clean and dry   Disposition: 02-Transferred to [facility]  Inpatient rehab  Discharge Instructions    Amb Referral to Cardiac Rehabilitation    Complete by:  As directed    Diagnosis:   Valve Repair STEMI Coronary Stents PTCA     Valve:  Mitral       Medication List    STOP taking these medications   amLODipine 10 MG tablet Commonly known as:  NORVASC   aspirin EC 81 MG tablet   ranitidine 150 MG tablet Commonly known as:  ZANTAC   ticagrelor 90 MG Tabs tablet Commonly known as:  BRILINTA     TAKE these medications   albuterol (2.5 MG/3ML) 0.083% nebulizer solution Commonly known as:  PROVENTIL Take 3 mLs (2.5 mg total) by nebulization every 2 (two) hours as needed for wheezing or shortness of breath.   amiodarone 200 MG tablet Commonly known as:  PACERONE Take 1 tablet (200 mg total) by mouth 2 (two) times daily.   atorvastatin 20 MG tablet Commonly known as:  LIPITOR Take 1 tablet (20 mg total) by mouth daily.   benzonatate 100 MG capsule Commonly known as:  TESSALON Take 1 capsule (100 mg total) by mouth 3 (three) times daily.   chlorpheniramine-HYDROcodone 10-8 MG/5ML Suer Commonly known as:  TUSSIONEX Take 5 mLs by mouth at bedtime as  needed for cough.   clopidogrel 75 MG tablet Commonly known as:  PLAVIX Take 1 tablet (75 mg total) by mouth daily.   Darbepoetin Alfa 100 MCG/0.5ML Sosy injection Commonly known as:  ARANESP Inject 0.5 mLs (100 mcg total) into the vein every Tuesday with hemodialysis.   diphenhydrAMINE 12.5 MG/5ML liquid Commonly known as:  BENADRYL Take 5 mLs (12.5 mg total) by mouth daily as needed (before dialysis).   diphenhydrAMINE 12.5 MG/5ML liquid Commonly known as:  BENADRYL Take 5 mLs (12.5 mg total) by mouth at bedtime as needed (insomnia).   famotidine 40 MG/5ML suspension Commonly known as:  PEPCID Take 2.5 mLs (20 mg total) by mouth daily.   feeding supplement (NEPRO CARB STEADY) Liqd Take 237 mLs by mouth daily. Start taking on:  10/12/2016   ferric gluconate 250 mg in sodium chloride 0.9 % 100 mL Inject 250 mg into the vein Every Tuesday,Thursday,and Saturday with dialysis. Start taking on:  10/12/2016   Gerhardt's butt cream Crea Apply 1 application topically 2 (two) times daily.   glycopyrrolate 1 MG tablet Commonly known as:  ROBINUL Place 1 tablet (1 mg total) into feeding tube 2 (two) times daily.   guaiFENesin 100 MG/5ML Soln Commonly known as:  ROBITUSSIN Place 30 mLs (600 mg total) into feeding tube every 6 (six) hours.   HYDROcodone-acetaminophen 7.5-325 mg/15 ml solution Commonly known as:  HYCET Take 15 mLs by mouth every 4 (four) hours as needed for moderate pain.   hydrocortisone 2.5 % rectal cream Commonly known as:  ANUSOL-HC Place rectally 2 (two) times daily.   insulin aspart 100 UNIT/ML injection Commonly known as:  novoLOG Inject 0-24 Units into the skin every 4 (four) hours.   insulin detemir 100 UNIT/ML injection Commonly known as:  LEVEMIR Inject 0.1 mLs (10 Units total) into the skin 2 (two) times daily.   lanthanum 1000 MG chewable tablet Commonly known as:  FOSRENOL Chew 1 tablet (1,000 mg total) by mouth 3 (three) times daily with  meals.   midodrine 10 MG tablet Commonly known as:  PROAMATINE Take 1 tablet (10 mg total) by mouth every morning.   mouth rinse Liqd solution 15 mLs by Mouth Rinse route 2 times daily at 12 noon and 4 pm.   multivitamin Tabs tablet Take 1 tablet by mouth at bedtime.   ondansetron 4 MG/2ML Soln injection Commonly known as:  ZOFRAN Inject 2 mLs (4 mg total) into the vein every 6 (six) hours as needed for nausea or vomiting.   RESOURCE THICKENUP CLEAR Powd Please use enough to thicken liquid   warfarin 2.5 MG tablet Commonly known as:  COUMADIN Take 1 tablet (2.5 mg total) by mouth daily at 6 PM. Start taking on:  10/12/2016      Follow-up Information    Rica Mast, MD. Call.   Specialty:  Internal Medicine       Tharon Aquas Trigt III, MD Follow up.   Specialty:  Cardiothoracic Surgery Why:  Appointment on 12/6 at 12:30. Please arrive at 12:00 for a chest xray. [facility] is located on the first floor of our Medical laboratory scientific officer information: [address] [place] [id number] [phone]        Glori Bickers, MD. Call in 1 day(s).   Specialty:  Cardiology Why:  Will need to call the office and make an appointment for 4 weeks Contact information: [place] [phone]        nursing Follow up.   Why:  Will need an INR checked daily at the CIR and Dr. Glori Bickers to dose her coumadin         The patient has been discharged on:   1.Beta Blocker:  Yes [   ]                              No   [ x  ]                              If No, reason:midodrine  2.Ace Inhibitor/ARB: Yes [   ]                                     No  [ x   ]                                     If No, reason: hypotension  3.Statin:   Yes [ x  ]                  No  [   ]                  If No, reason:  4.Ecasa:  Yes  [   ]  No   [ x  ]                  If No, reason: Plavix and  coumadin   Signed: Elgie Collard 10/11/2016, 1:40 PM

## 2016-10-11 NOTE — Progress Notes (Signed)
Nutrition Follow-up  INTERVENTION:   - 48-hour calorie count initiated. - Will follow-up with pt for calorie count on 10/12/16. - Will decrease Nepro oral nutrition supplement to once daily per pt request. Each provides 425 kcal and 19.1 grams protein.  NUTRITION DIAGNOSIS:   Inadequate oral intake related to inability to eat as evidenced by NPO status.  Inadequate oral intake ongoing.  GOAL:   Patient will meet greater than or equal to 90% of their needs  Unmet.  MONITOR:   PO intake, Diet advancement, Labs, Weight trends, TF tolerance, Skin, I & O's  ASSESSMENT:   60 y/o woman with DM2, HTN, HL and noncompliance presented to [facility] ER with infrerior posterior STEMI on 9/12. Taken to cath lab and found to have occluded small to moderate OM-1 branch. Underwent successful PCS with stent x 2 by Dr. Clayborn Bigness. Coronaries otherwise ok. EF 55-60% with severe MR confirmed by echo.   Consulted for 48-hour kcal count. Left calorie count envelope on pt's door with instructions.  Spoke with pt at bedside who reports continued decreased appetite with "no taste for food." Pt requests having Nepro oral nutrition supplement only once daily as they make her feel "sick."  Medications reviewed and include 20 mg Lipitor daily, 75 mg Plavix daily, 20 mg Pepcid daily, sliding scale Novolog, 10 units Levemir BID, Rena-vit daily, liquid MVI daily, 20 mEq potassium chloride once, warfarin, PRN Zofran  Labs reviewed and include low sodium (133 mmol/L), low potassium (3.4 mmol/L), low chloride (94 mmol/L), elevated BUN (22 mg/dL), elevated creatinine (3.44 mg/dL), low calcium (8.5 mg/dL), elevated phosphorus (5.3 mg/dL) CBG's: 55-169 mg/dL  NFPE: Exam completed. Mild depletion, moderate muscle depletion, and mild edema noted.  Diet Order:  DIET DYS 2 Room service appropriate? Yes; Fluid consistency: Nectar Thick  Skin:  Wound (see comment) (Stage 2 to buttocks and neck)  Last BM:  10/09/16  Height:    Ht Readings from Last 1 Encounters:  10/06/16 5\' 1"  (1.549 m)    Weight:   Wt Readings from Last 1 Encounters:  10/11/16 128 lb 1.4 oz (58.1 kg)    Ideal Body Weight:  47.7 kg  BMI:  Body mass index is 24.2 kg/m.  Estimated Nutritional Needs:   Kcal:  1700-1900  Protein:  90-110 grams  Fluid:  per MD  EDUCATION NEEDS:   No education needs identified at this time  Jeb Levering Dietetic Intern Pager Number: [phone]

## 2016-10-11 NOTE — Progress Notes (Signed)
Late entry . Patient arrived on unit at 1630 via bed from 2 W. Patient alert and oriented x4 . No complaints of pain. Congested cough noted nonproductive. Oriented patient and daughter to room and call bell system. Daughter asking questions to repeat information multiple times. Patient verbalized understanding of admission process. Continue with plan of care.  Deborah [name]

## 2016-10-11 NOTE — Progress Notes (Signed)
Patient ID: Deborah [name], female   DOB: 07-11-1956, 60 y.o.   MRN: [id number]  [facility] Progress Note   Subjective:    56 kg post HD 11/7 well tolerated Still with cough Eating bfast   Objective:   BP 114/62 (BP Location: Right Arm)   Pulse 85   Temp 98.2 F (36.8 C) (Oral)   Resp 18   Ht 5\' 1"  (1.549 m)   Wt 58.1 kg (128 lb 1.4 oz)   SpO2 99%   BMI 24.20 kg/m   Physical Exam: VS as noted NAD Prev trach site covered Rhoncorous BS  S1S2 No S3 Healed sternotomy scar Abd soft and not tender No edema of LE's Left upper arm AVG with palpable thrill and good quality bruit (10/30) Left sided TDC (9/28 VVS)   Recent Labs Lab 10/05/16 1938 10/07/16 0904 10/08/16 0123 10/09/16 0256 10/10/16 0356 10/11/16 0348  NA 135 128* 134* 134* 133* 133*  K 3.8 4.0 3.5 4.1 4.0 3.4*  CL 97* 90* 94* 94* 91* 94*  CO2 26 23 25 24 24 26   GLUCOSE 129* 233* 238* 150* 132* 53*  BUN 24* 96* 34* 60* 82* 22*  CREATININE 1.97* 4.24* 2.66* 4.59* 5.88* 3.44*  CALCIUM 8.7* 8.9 9.1 9.1 9.1 8.5*  PHOS 1.2* 2.3*  --   --  7.2* 5.3*   Iron/TIBC/Ferritin/ %Sat    Component Value Date/Time   IRON 22 (L) 10/10/2016 0356   TIBC 326 10/10/2016 0356   FERRITIN 272 10/10/2016 0356   IRONPCTSAT 7 (L) 10/10/2016 0356    Recent Labs Lab 10/07/16 0258 10/07/16 0904 10/10/16 0356 10/11/16 0348  WBC 18.3* 17.8* 18.7* 15.1*  HGB 10.4* 10.6* 11.1* 10.3*  HCT 33.9* 34.3* 34.6* 33.5*  MCV 89.9 89.6 86.7 87.9  PLT 388 390 529* 475*   Medications:    . amiodarone  200 mg Oral BID  . atorvastatin  20 mg Oral q1800  . chlorhexidine  15 mL Mouth Rinse BID  . clopidogrel  75 mg Oral Daily  . darbepoetin (ARANESP) injection - DIALYSIS  100 mcg Intravenous Q Tue-HD  . famotidine  20 mg Oral Daily  . feeding supplement (NEPRO CARB STEADY)  237 mL Oral BID BM  . feeding supplement (PRO-STAT SUGAR FREE 64)  30 mL Per Tube TID  . [START ON 10/12/2016] ferric gluconate  (FERRLECIT/NULECIT) IV  250 mg Intravenous Q T,Th,Sa-HD  . Gerhardt's butt cream   Topical BID  . glycopyrrolate  1 mg Per Tube BID  . guaiFENesin  30 mL Per Tube Q6H  . hydrocortisone   Rectal BID  . insulin aspart  0-24 Units Subcutaneous Q4H  . insulin detemir  10 Units Subcutaneous BID  . lanthanum  1,000 mg Oral TID WC  . mouth rinse  15 mL Mouth Rinse q12n4p  . midodrine  10 mg Oral q morning - 10a  . multivitamin  1 tablet Oral QHS  . multivitamin  15 mL Per Tube Daily  . Warfarin - Physician Dosing Inpatient   Does not apply q1800   Background: 60 y/o AAF with DM2, HTN, HLD presented to [facility] ER with infposterior STEMI on 9/12->cath->. PCS with stent x 2 by Dr. Clayborn Bigness. EF 55-60% with severe MR.  Post-cath respiratory failure ->intubation. Shock and renal failure (creatinine 1.6 on adm). Trialysis catheter placed 9/14 and pt had CRRT at [facility] for 48 hours prior to transfer here on 08/18/16->back to cath lab -> ruptured posterior papillary muscle with severe  MR->IABP/Swan. We were asked to see d/t progressive rise in creatinine/volume overload/anuric AKI .  CRRT re-initiated 08/21/16-9/20, then 9/21 post MVR. Transitioned to IHD.  Dialysis dependent since 08/17/16. No recovery. New ESRD.  Assessment/ Plan:    1. 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 Amio. Coumadin. Midodrine for BP.  2. 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  TTS schedule via Left TDC (9/28 Scot Dock). S/P LUA AVG placed on 10/30 (brachioaxillary) by Dr. Donzetta Matters - maturation in 4 weeks. EDW looks to be about 56-57 kg. (outpt has spot [place] TTS) 3. Hypokalemia - K 3.4. Give [place] today. 4K bath with HD tomorrow.  4. Anemia: Secondary to anemia of ESRD/Fe def - on darbe 100 QTuesday. TSat 7%. Fe load with HD.  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 Added non ca based binder - try fosrenol.  PTH still pending. 2Ca bath with HD 6. Nutrition:  Low albumin secondary to critical illness/prolonged hospitalization, (Nepro/continue renal vitamin) 7. Cough/intermittent dyspnea: Last CXR 11/5 clear, no edema. I presume aspiration is the concern. On Dysphagia diet. 8. Disposition - ? inpt rehab?  Jamal Maes, MD [facility] [phone] Pager 10/11/2016, 8:53 AM

## 2016-10-11 NOTE — Progress Notes (Signed)
Gerlean Ren [facility] Admission Coordinator Signed Physical Medicine and Rehabilitation  PMR Pre-admission Date of Service: 09/27/2016 1:52 PM  Related encounter: Admission (Current) from 08/18/2016 in [place]       [] Hide copied text PMR Admission Coordinator Pre-Admission Assessment  Patient: Deborah [name] is an 60 y.o., female MRN: [id number] DOB: September 04, 1956 Height: 5\' 1"  (154.9 cm) Weight: 58.1 kg (128 lb 1.4 oz)                                                                                                                                                  Insurance Information HMO:     PPO:      PCP:      IPA:      80/20:      OTHER:  PRIMARY: Uninsured      Policy#:       Subscriber:  CM Name:       Phone#:      Fax#:  Pre-Cert#:       Employer:  Benefits:  Phone #:      Name:  Eff. Date:      Deduct:       Out of Pocket Max:       Life Max:  CIR:       SNF:  Outpatient:      Co-Pay:  Home Health:       Co-Pay:  DME:      Co-Pay:  Providers:  SECONDARY:       Policy#:       Subscriber:  CM Name:       Phone#:      Fax#:  Pre-Cert#:       Employer:  Benefits:  Phone #:      Name:  Eff. Date:      Deduct:       Out of Pocket Max:       Life Max:  CIR:       SNF:  Outpatient:      Co-Pay:  Home Health:       Co-Pay:  DME:      Co-Pay:   Medicaid Application Date: 2/44/01      Case Manager:  Disability Application Date:       Case Worker:   Emergency Contact Information        Contact Information    Name Relation Home Work Mobile   Nazareno,TERESA  [phone]     Wurzel,Sarah Daughter  [phone]    Danya, Spearman [phone]       Current Medical History  Patient Admitting Diagnosis: Debility due to multiple medical issues History of Present Illness: CAMARIA GERALD a 60 y.o.femalewith history of T2DM, HTN, CKD stage III with medical non-compliance who was originally admitted to [facility] 08/15/16 with NSTEMI  and underwent cardiac cath  with stenting. History taken from chart review. She developed acute renal failure with metabolic acidosis and oliguria requiring CRRT. She went on to develop cardiogenic shock requiring intubation and was transferred to [facility] on 08/18/16 for treatment. 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 She underwent mitral valve repair by Prescott Gum on 9/21. Right IJ placed on 9/28 and HD initiated. She was extubated on 9/29 but reintubated due to septic shock due to staph bacteremia and required pressors for support. A flutter treated with TEE DCCV on 10/2. On Coumadin and amiodarone for sinus tach with PAF.She underwent tracheostomy on 10/5 and was weaned to ATC and tolerating PMSV for short periods of time. Has completed treatment for enterobacter HCAP and was decannualted on 10/25.     MBS done 10/23 showing moderate pharyngeal dysphagia--she was started on dysphagia 2, nectar liquids with chin tuck to prevent penetration. LUE AV graft and tunneled diatek catheter placed on 10/30 as patient HD dependent. Has been waned off milrinone and is tolerating HD session with midodrine on board. She continues to have frequent productive cough as well as paroxysmal episodes with activity and follow up CXR 11/1 showed mild pulmonary vascular congestion with edema. She was started on cipro for bronchitistreatment. Repeat MBS done 11/01 with recommendations to continue current diet. Panda tube removed on 11/5 due to ongoing coughing episodes and concerns of aspiration.   Therapy ongoing and patient with weakness with balance and proprioceptive deficits, scissoring gait as well as dysphagia. CIR wasrecommended for follow up therapy.       Past Medical History      Past Medical History:  Diagnosis Date  . Diabetes mellitus   . Hyperlipidemia   . Hypertension     Family History  family history includes Breast cancer in her  sister; Diabetes in her mother; Hypertension in her mother.  Prior Rehab/Hospitalizations:  Has the patient had major surgery during 100 days prior to admission? No  Current Medications   Current Facility-Administered Medications:  .  0.9 %  sodium chloride infusion, 250 mL, Intravenous, Continuous, Elgie Collard, PA-C, Stopped at 10/02/16 1518 .  albuterol (PROVENTIL) (2.5 MG/3ML) 0.083% nebulizer solution 2.5 mg, 2.5 mg, Nebulization, Q2H PRN, Chesley Mires, MD, 2.5 mg at 10/07/16 0223 .  amiodarone (PACERONE) tablet 200 mg, 200 mg, Oral, BID, Amy D Clegg, NP, 200 mg at 10/10/16 2209 .  atorvastatin (LIPITOR) tablet 20 mg, 20 mg, Oral, q1800, Jolaine Artist, MD, 20 mg at 10/10/16 1655 .  chlorhexidine (PERIDEX) 0.12 % solution 15 mL, 15 mL, Mouth Rinse, BID, Ivin Poot, MD, 15 mL at 10/10/16 2209 .  chlorpheniramine-HYDROcodone (TUSSIONEX) 10-8 MG/5ML suspension 5 mL, 5 mL, Oral, QHS PRN, Ivin Poot, MD, 5 mL at 10/09/16 2042 .  clopidogrel (PLAVIX) tablet 75 mg, 75 mg, Oral, Daily, Janalyn Harder Trinh, PA-C, 75 mg at 10/10/16 1126 .  Darbepoetin Alfa (ARANESP) injection 100 mcg, 100 mcg, Intravenous, Q Tue-HD, Ivin Poot, MD, 100 mcg at 10/03/16 1451 .  diphenhydrAMINE (BENADRYL) 12.5 MG/5ML elixir 12.5 mg, 12.5 mg, Oral, QHS PRN, Elgie Collard, PA-C, 12.5 mg at 10/10/16 0013 .  diphenhydrAMINE (BENADRYL) 12.5 MG/5ML liquid 12.5 mg, 12.5 mg, Oral, Daily PRN, Elgie Collard, PA-C, 12.5 mg at 10/07/16 1829 .  famotidine (PEPCID) 40 MG/5ML suspension 20 mg, 20 mg, Oral, Daily, Melburn Popper, RPH, 20 mg at 10/10/16 1127 .  [START ON 10/12/2016] feeding supplement (NEPRO CARB  STEADY) liquid 237 mL, 237 mL, Oral, Daily, Rogue Bussing, RD .  Derrill Memo ON 10/12/2016] ferric gluconate (NULECIT) 250 mg in sodium chloride 0.9 % 100 mL IVPB, 250 mg, Intravenous, Q T,Th,Sa-HD, Jamal Maes, MD .  Gerhardt's butt cream, , Topical, BID, Ivin Poot, MD .  glycopyrrolate (ROBINUL)  tablet 1 mg, 1 mg, Per Tube, BID, Chesley Mires, MD, 1 mg at 10/10/16 2209 .  guaiFENesin (ROBITUSSIN) 100 MG/5ML solution 600 mg, 30 mL, Per Tube, Q6H, Ivin Poot, MD, 600 mg at 10/11/16 [phone] .  HYDROcodone-acetaminophen (HYCET) 7.5-325 mg/15 ml solution 15 mL, 15 mL, Oral, Q4H PRN, Ivin Poot, MD, 15 mL at 10/06/16 0253 .  hydrocortisone (ANUSOL-HC) 2.5 % rectal cream, , Rectal, BID, Ivin Poot, MD .  CBG monitoring, , , Q4H **AND** insulin aspart (novoLOG) injection 0-24 Units, 0-24 Units, Subcutaneous, Q4H, Ivin Poot, MD, 12 Units at 10/10/16 2210 .  insulin detemir (LEVEMIR) injection 10 Units, 10 Units, Subcutaneous, BID, Ivin Poot, MD, 10 Units at 10/10/16 2210 .  lanthanum (FOSRENOL) chewable tablet 1,000 mg, 1,000 mg, Oral, TID WC, Jamal Maes, MD, 1,000 mg at 10/11/16 [phone] .  MEDLINE mouth rinse, 15 mL, Mouth Rinse, q12n4p, Ivin Poot, MD, 15 mL at 10/09/16 1200 .  midodrine (PROAMATINE) tablet 10 mg, 10 mg, Oral, q morning - 10a, Ivin Poot, MD, 10 mg at 10/10/16 1126 .  multivitamin (RENA-VIT) tablet 1 tablet, 1 tablet, Oral, QHS, Elmarie Shiley, MD, 1 tablet at 10/10/16 2209 .  multivitamin liquid 15 mL, 15 mL, Per Tube, Daily, Fleet Contras, MD, 15 mL at 10/10/16 1124 .  ondansetron (ZOFRAN) injection 4 mg, 4 mg, Intravenous, Q6H PRN, Elgie Collard, PA-C, 4 mg at 10/10/16 0006 .  potassium chloride SA (K-DUR,KLOR-CON) CR tablet 20 mEq, 20 mEq, Oral, Once, Jamal Maes, MD .  RESOURCE THICKENUP CLEAR, , Oral, PRN, Ivin Poot, MD .  sodium chloride flush (NS) 0.9 % injection 3 mL, 3 mL, Intravenous, PRN, Elgie Collard, PA-C, 3 mL at 10/06/16 0848 .  [START ON 10/12/2016] warfarin (COUMADIN) tablet 2.5 mg, 2.5 mg, Oral, q1800, Elgie Collard, PA-C .  Warfarin - Physician Dosing Inpatient, , Does not apply, q1800, Ivin Poot, MD  Patients Current Diet: DIET DYS 2 Room service appropriate? Yes; Fluid consistency: Nectar Thick  Precautions  / Restrictions Precautions Precautions: Sternal, Fall Precaution Comments: panda Restrictions Weight Bearing Restrictions: Yes (sternal precautions)   Has the patient had 2 or more falls or a fall with injury in the past year?No  Prior Activity Level Community (5-7x/wk): Pt. did not drive PTA.  Keeps grandchildren at times and would walk to the park.  Pt. would go out several times per week with one of her children.  Home Assistive Devices / Equipment Home Assistive Devices/Equipment: None  Prior Device Use: Indicate devices/aids used by the patient prior to current illness, exacerbation or injury? None of the above  Prior Functional Level Prior Function Level of Independence: Independent  Self Care: Did the patient need help bathing, dressing, using the toilet or eating?  Independent  Indoor Mobility: Did the patient need assistance with walking from room to room (with or without device)? Independent  Stairs: Did the patient need assistance with internal or external stairs (with or without device)? Independent  Functional Cognition: Did the patient need help planning regular tasks such as shopping or remembering to take medications? Independent  Current Functional Level Cognition Overall Cognitive  Status: Impaired/Different from baseline Difficult to assess due to: Tracheostomy (with PMSV 50% of session) Current Attention Level: Selective Orientation Level: Oriented X4 Following Commands: Follows one step commands consistently Safety/Judgement: Decreased awareness of safety General Comments: Pt demonstrates poor safety awareness. unable to recall any precautions before during or after session despite education    Extremity Assessment (includes Sensation/Coordination) Upper Extremity Assessment: RUE deficits/detail, LUE deficits/detail RUE Deficits / Details: Rt UE grossly 3+/5 LUE Deficits / Details: Grossly 3-/5  Lower Extremity Assessment: Defer to PT  evaluation RLE Deficits / Details: grossly 2+/5 with decreased dorsiflexion bil LE LLE Deficits / Details: grossly 2+/5 with decreased dorsiflexion bil LE   ADLs Overall ADL's : Needs assistance/impaired Eating/Feeding: NPO Grooming: Wash/dry hands, Wash/dry face, Minimal assistance, Standing Upper Body Bathing: Maximal assistance, Sitting Lower Body Bathing: Total assistance, Sit to/from stand Upper Body Dressing : Total assistance, Sitting Lower Body Dressing: Moderate assistance, Sit to/from stand Lower Body Dressing Details (indicate cue type and reason): Pt required min A to don/doff Lt sock.  Total A for Rt  Toilet Transfer: Moderate assistance, Ambulation, Comfort height toilet, BSC Toilet Transfer Details (indicate cue type and reason): Mod A to move into standing when fatigued  Toileting- Clothing Manipulation and Hygiene: Maximal assistance, Sit to/from stand Functional mobility during ADLs: Moderate assistance, Rolling walker General ADL Comments: Pt requires assist for balance and thoroughness    Mobility Overal bed mobility: Needs Assistance Bed Mobility: Supine to Sit, Sit to Supine Rolling: Min assist Sidelying to sit: Supervision, HOB elevated Supine to sit: Min guard Sit to supine: Min guard Sit to sidelying: Min assist General bed mobility comments: cues for sequence to roll to side and elevate trunk, min assist for trunk elevation, HOB 20 degrees   Transfers Overall transfer level: Needs assistance Equipment used: 1 person hand held assist Transfers: Sit to/from Stand, Stand Pivot Transfers Sit to Stand: Min assist Stand pivot transfers: Min assist General transfer comment: cues for hand placement and balance    Ambulation / Gait / Stairs / Wheelchair Mobility Ambulation/Gait Ambulation/Gait assistance: Mod assist, +2 safety/equipment Ambulation Distance (Feet): 45 Feet Assistive device: Rolling walker (2 wheeled) Gait Pattern/deviations: Step-through pattern,  Decreased stride length, Scissoring General Gait Details: pt with variation in being too close and too far posterior in RW with scissoring and left lean at times. Assist for balance, control of RW and cues for safety and sequence. Chair with close follow as pt fatigues and sits with limted warning. Pt walked 45' x 2 trials  Gait velocity interpretation: Below normal speed for age/gender   Posture / Balance Dynamic Sitting Balance Sitting balance - Comments: close min guard assist  Balance Overall balance assessment: Needs assistance Sitting-balance support: Feet supported Sitting balance-Leahy Scale: Fair Sitting balance - Comments: close min guard assist  Postural control: Posterior lean, Left lateral lean Standing balance support: During functional activity, Single extremity supported Standing balance-Leahy Scale: Poor Standing balance comment: requires min A    Special needs/care consideration BiPAP/CPAP   no CPM   no Continuous Drip IV   Dialysis new HD         Days TuThSat Life Vest   no Oxygen   room air Special Bed no Trach Size   decannulated 09/27/16 Wound Vac (area)  no       Skin  Per nursing assessment, MASD left buttocks; ecchymosis abdomen and arms; left upper proximal leg excoraition             Bowel mgmt:  10/09/16, incontinent Bladder mgmt:  Intermittent incontinence  Diabetic mgmt   yes    Previous Home Environment Living Arrangements: Children (lived with son Ashea Winiarski PTA) Available Help at Discharge: Available 24 hours/day, Other (Comment) (pt. to stay with daughter [name] who will work from home) Type of Home: [place]: No Additional Comments: pt on vent and trach and unable to provide all PLOF and home environment at this time.   Discharge Living Setting Plans for Discharge Living Setting: Other (Comment) (daughter Marny Smethers home)  [name] states she is currently in the process of obtaining a rental house and hopes to be in the new  location by 10/13/16.   Type of Home at Discharge: Apartment Discharge Home Layout: One level Discharge Home Access: Level entry Discharge Bathroom Shower/Tub: Tub/shower unit Discharge Bathroom Toilet: Standard Discharge Bathroom Accessibility: Yes How Accessible: Accessible via wheelchair, Accessible via walker Does the patient have any problems obtaining your medications?: Yes (Describe) (pt. is uninsured)  Social/Family/Support Systems Patient Roles: Parent Anticipated Caregiver: Miamarie Moll, daughter to be primary caregiver.  Per [name], she currently works out of the home but has 2 job offers to work from her home.  She plans to accept one of these jobs so she can care for her mom.  Shamra Bradeen, daughter works full time days and will help in the evenings Anticipated Caregiver's Contact Information: Letricia Krinsky, [phone]; Laconya Clere, [phone] Ability/Limitations of Caregiver: [name] currently works 3rd shift but states she plans to work from home to care for her mom.   Caregiver Availability: 24/7 Discharge Plan Discussed with Primary Caregiver: Yes (Discussed at length with [name] and Douglass Rivers) Is Caregiver In Agreement with Plan?: Yes Does Caregiver/Family have Issues with Lodging/Transportation while Pt is in Rehab?: No  Goals/Additional Needs Patient/Family Goal for Rehab: supervision and min assist PT/OT; modified independent and supervision SLP Expected length of stay: 18-21 days Cultural Considerations: n/a Dietary Needs: dysphagia 2, nectar thick Equipment Needs: TBA Special Service Needs: Pt. is a new start to HD, now clipped Pt/Family Agrees to Admission and willing to participate: Yes Program Orientation Provided & Reviewed with Pt/Caregiver Including Roles  & Responsibilities: Yes  Decrease burden of Care through IP rehab admission: n/a  Possible need for SNF placement upon discharge:   Not anticipated  Patient Condition: This patient's medical and  functional status has changed since the consult dated 09/20/16 in which the Rehabilitation Physician determined and documented that the patient was potentially appropriate for intensive rehabilitative care in an inpatient rehabilitation facility. Issues have been addressed and update has been discussed with Dr.Patel  and patient now appropriate for inpatient rehabilitation. Will admit to inpatient rehab today.    Preadmission Screen Completed By:  Gerlean Ren, 10/11/2016 11:24 AM ______________________________________________________________________   Discussed status with Dr.Patel   On 10/11/16 at 8  and received telephone approval for admission today.  Admission Coordinator:  Gerlean Ren, time  4034 /Date 10/11/16       Cosigned by: Ankit Lorie Phenix, MD at 10/11/2016 11:29 AM  Revision History

## 2016-10-11 NOTE — Progress Notes (Signed)
Inpatient Rehabilitation  I have spoken with Tessa, PA and with the rehab team.  All are in agreement to admit pt. to CIR today.  I have updated the patient and her daughter, Deborah [name] by phone.  They are also in agreement.  Marvetta Gibbons, RNCM and pt's RN are  aware of the plan.    Please call if questions.  [place] Admissions Coordinator Cell [phone] Office [phone]

## 2016-10-11 NOTE — Progress Notes (Signed)
Inpatient Diabetes Program Recommendations  AACE/ADA: New Consensus Statement on Inpatient Glycemic Control (2015)  Target Ranges:  Prepandial:   less than 140 mg/dL      Peak postprandial:   less than 180 mg/dL (1-2 hours)      Critically ill patients:  140 - 180 mg/dL   Lab Results  Component Value Date   GLUCAP 96 10/11/2016   HGBA1C 13.3 (H) 08/15/2016    Review of Glycemic Control Results for PAGE, PUCCIARELLI (MRN [id number]) as of 10/11/2016 10:41  Ref. Range 10/10/2016 03:46 10/10/2016 04:55 10/10/2016 11:10 10/10/2016 11:34 10/10/2016 12:26 10/10/2016 16:21 10/10/2016 20:17 10/11/2016 00:09 10/11/2016 04:16 10/11/2016 04:50 10/11/2016 08:01  Glucose-Capillary Latest Ref Range: 65 - 99 mg/dL 146 (H) 130 (H) 44 (LL) 51 (L) 208 (H) 144 (H) 260 (H) 169 (H) 55 (L) 150 (H) 96   Inpatient Diabetes Program Recommendations: Noted hypoglycemia post correction doses of Novolog. Please consider: -Decrease Novolog correction to sensitive 0-9 q 4 hrs -Add meal coverage tid Novolog 4 units when eats 50%  Thank you, Bethena Roys E. Remmy Crass, RN, MSN, CDE Inpatient Glycemic Control Team Team Pager [phone] (8am-5pm) 10/11/2016 10:43 AM

## 2016-10-11 NOTE — H&P (Signed)
Physical Medicine and Rehabilitation Admission H&P    CC: Debility due to multiple medical issues.   HPI:  Deborah [name] is a 60 y.o. female with history of T2DM, HTN, CKD stage III with medical non-compliance who was originally admitted to [facility] 08/15/16 with NSTEMI and underwent cardiac cath with stenting. History taken from chart review. She developed acute renal failure with metabolic acidosis and oliguria requiring CRRT. She went on to develop cardiogenic shock requiring intubation and was transferred to [facility] on 08/18/16 for treatment. 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 She underwent mitral valve repair by Prescott Gum on 9/21.  Right IJ placed on 9/28 and HD initiated.  She was extubated on 9/29 but reintubated due to septic shock due to staph bacteremia and required pressors for support.  A flutter treated with TEE DCCV on 10/2. On  Coumadin and amiodarone for sinus tach with PAF.  She underwent tracheostomy on 10/5 and was weaned to ATC and tolerating PMSV for short periods of time. Has completed treatment for enterobacter HCAP and was decannualted on 10/25.  MBS done 10/23 showing moderate pharyngeal dysphagia--she was started on dysphagia 2, nectar liquids with chin tuck to prevent penetration.  LUE AV graft and tunneled diatek catheter placed on 10/30 as patient HD dependent. Has been waned off milrinone and is tolerating HD session with midodrine on board. She continues to have frequent productive cough as well as paroxysmal episodes with activity and has been treated with cipro due to  Concerns of bronchitis.  Follow up CXR done 11/5 due to episode of hypoxia/dyspnea and showed lungs to be clear. Panda removed due to concerns of aspiration. Follow up MBS done today and diet advanced to dysphagia 3, thin. Therapy ongoing and patient with weakness with balance and proprioceptive deficits, scissoring gait as well as dysphagia. CIR was  recommended for follow up therapy.     Review of Systems  HENT: Negative for hearing loss.   Eyes: Negative for blurred vision and double vision.  Respiratory: Positive for cough (does not feel that's its getting better) and sputum production.   Cardiovascular: Negative for chest pain, palpitations and leg swelling.  Gastrointestinal: Positive for constipation. Negative for abdominal pain, heartburn and nausea.  Genitourinary: Negative for dysuria.  Musculoskeletal: Negative for back pain, joint pain and myalgias.  Skin: Negative for itching and rash.  Neurological: Positive for weakness. Negative for dizziness (on and off), sensory change (tingling bilateral feet. Weakness RLE), focal weakness and headaches.  Psychiatric/Behavioral: The patient is nervous/anxious and has insomnia.   All other systems reviewed and are negative.    Past Medical History:  Diagnosis Date  . Diabetes mellitus   . Hyperlipidemia   . Hypertension     Past Surgical History:  Procedure Laterality Date  . AV FISTULA PLACEMENT Left 10/02/2016   Procedure: INSERTION OF ARTERIOVENOUS (AV) GORE-TEX GRAFT ARM;  Surgeon: Waynetta Sandy, MD;  Location: [place];  Service: Vascular;  Laterality: Left;  . CARDIAC CATHETERIZATION N/A 08/15/2016   Procedure: Left Heart Cath and Coronary Angiography;  Surgeon: Yolonda Kida, MD;  Location: [place] CV [facility];  Service: Cardiovascular;  Laterality: N/A;  . CARDIAC CATHETERIZATION N/A 08/15/2016   Procedure: Coronary Stent Intervention;  Surgeon: Yolonda Kida, MD;  Location: [place] [facility];  Service: Cardiovascular;  Laterality: N/A;  . CARDIAC CATHETERIZATION N/A 08/18/2016   Procedure: Right Heart Cath;  Surgeon: Shaune Pascal Bensimhon,  MD;  Location: [facility];  Service: Cardiovascular;  Laterality: N/A;  . CARDIAC CATHETERIZATION N/A 08/18/2016   Procedure: IABP Insertion;  Surgeon: Daniel R Bensimhon, MD;  Location: [facility];   Service: Cardiovascular;  Laterality: N/A;  . CARDIAC CATHETERIZATION Right 08/23/2016   Procedure: CENTRAL LINE INSERTION RIGHT SUBCLAVIAN;  Surgeon: Peter Van Trigt, MD;  Location: [facility];  Service: Open Heart Surgery;  Laterality: Right;  . ENDOVEIN HARVEST OF GREATER SAPHENOUS VEIN Left 08/23/2016   Procedure: ENDOVEIN HARVEST OF GREATER SAPHENOUS VEIN;  Surgeon: Peter Van Trigt, MD;  Location: [facility];  Service: Open Heart Surgery;  Laterality: Left;  . INSERTION OF DIALYSIS CATHETER Bilateral 08/31/2016   Procedure: INSERTION OF DIALYSIS CATHETER LEFT INTERNAL JUGULAR VEIN & INSERTION OF TRIPLE LUMEN RIGHT INTERNAL JUGULAR VEIN;  Surgeon: Christopher S Dickson, MD;  Location: [facility];  Service: Vascular;  Laterality: Bilateral;  . INTRAOPERATIVE TRANSESOPHAGEAL ECHOCARDIOGRAM N/A 08/23/2016   Procedure: INTRAOPERATIVE TRANSESOPHAGEAL ECHOCARDIOGRAM;  Surgeon: Peter Van Trigt, MD;  [facility];  Service: Open Heart Surgery;  Laterality: N/A;  . MITRAL VALVE REPAIR N/A 08/23/2016   Procedure: MITRAL VALVE REPAIR (MVR) USING 25MM EDWARDS MAGNA EASE BIOPROSTHESIS MITRAL  VALVE;  Surgeon: Peter Van Trigt, MD;  Location: [facility];  Service: Open Heart Surgery;  Laterality: N/A;  . VAGINAL DELIVERY     x 6    Family History  Problem Relation Age of Onset  . Hypertension Mother   . Diabetes Mother   . Breast cancer Sister     Social History:  Lives with son and helps manage his home. 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 Has multiple family member in [place].  She reports that she has never smoked. She has never used smokeless tobacco. She reports that she does not drink alcohol or use drugs.    Allergies: No Known Allergies    Medications Prior to Admission  Medication Sig Dispense Refill  . amLODipine (NORVASC) 10 MG tablet Take 1 tablet (10 mg total) by mouth daily. 90 tablet 0  . aspirin EC 81 MG tablet Take 81 mg by mouth every  other day.    . atorvastatin (LIPITOR) 20 MG tablet Take 1 tablet (20 mg total) by mouth daily. 30 tablet 1  . ranitidine (ZANTAC) 150 MG tablet TAKE ONE TABLET BY MOUTH TWICE DAILY 180 tablet 1  . [EXPIRED] ticagrelor (BRILINTA) 90 MG TABS tablet Take 1 tablet (90 mg total) by mouth 2 (two) times daily. 60 tablet 6  . [DISCONTINUED] metFORMIN (GLUCOPHAGE) 500 MG tablet Take 500 mg by mouth 2 (two) times daily with a meal.    . [DISCONTINUED] metoprolol (LOPRESSOR) 100 MG tablet Take 100 mg by mouth 2 (two) times daily.    . [DISCONTINUED] valsartan-hydrochlorothiazide (DIOVAN-HCT) 320-25 MG tablet Take 1 tablet by mouth daily.      Home: Home Living Family/patient expects to be discharged to:: Unsure Living Arrangements: Children (lived with son Franco Sciuto PTA) Available Help at Discharge: Available 24 hours/day, Other (Comment) (pt. to stay with daughter [name] who will work from home) Type of Home: House Additional Comments: pt on vent and trach and unable to provide all PLOF and home environment at this time.    Functional History: Prior Function Level of Independence: Independent  Functional Status:  Mobility: Bed Mobility Overal bed mobility: Needs Assistance Bed Mobility: Supine to Sit, Sit to Supine Rolling: Min assist Sidelying to sit: Supervision,   HOB elevated Supine to sit: Min guard Sit to supine: Min guard Sit to sidelying: Min assist General bed mobility comments: cues for sequence to roll to side and elevate trunk, min assist for trunk elevation, HOB 20 degrees Transfers Overall transfer level: Needs assistance Equipment used: 1 person hand held assist Transfers: Sit to/from Stand, Stand Pivot Transfers Sit to Stand: Min assist Stand pivot transfers: Min assist General transfer comment: cues for hand placement and balance  Ambulation/Gait Ambulation/Gait assistance: Mod assist, +2 safety/equipment Ambulation Distance (Feet): 45 Feet Assistive device: Rolling  walker (2 wheeled) Gait Pattern/deviations: Step-through pattern, Decreased stride length, Scissoring General Gait Details: pt with variation in being too close and too far posterior in RW with scissoring and left lean at times. Assist for balance, control of RW and cues for safety and sequence. Chair with close follow as pt fatigues and sits with limted warning. Pt walked 45' x 2 trials  Gait velocity interpretation: Below normal speed for age/gender    ADL: ADL Overall ADL's : Needs assistance/impaired Eating/Feeding: NPO Grooming: Wash/dry hands, Wash/dry face, Minimal assistance, Standing Upper Body Bathing: Maximal assistance, Sitting Lower Body Bathing: Total assistance, Sit to/from stand Upper Body Dressing : Total assistance, Sitting Lower Body Dressing: Moderate assistance, Sit to/from stand Lower Body Dressing Details (indicate cue type and reason): Pt required min A to don/doff Lt sock.  Total A for Rt  Toilet Transfer: Moderate assistance, Ambulation, Comfort height toilet, BSC Toilet Transfer Details (indicate cue type and reason): Mod A to move into standing when fatigued  Toileting- Clothing Manipulation and Hygiene: Maximal assistance, Sit to/from stand Functional mobility during ADLs: Moderate assistance, Rolling walker General ADL Comments: Pt requires assist for balance and thoroughness   Cognition: Cognition Overall Cognitive Status: Impaired/Different from baseline Orientation Level: Oriented X4 Cognition Arousal/Alertness: Awake/alert Behavior During Therapy: Flat affect Overall Cognitive Status: Impaired/Different from baseline Area of Impairment: Attention, Safety/judgement, Problem solving Current Attention Level: Selective Memory: Decreased recall of precautions, Decreased short-term memory Following Commands: Follows one step commands consistently Safety/Judgement: Decreased awareness of safety Problem Solving: Difficulty sequencing, Requires verbal cues,  Requires tactile cues General Comments: Pt demonstrates poor safety awareness. unable to recall any precautions before during or after session despite education Difficult to assess due to: Tracheostomy (with PMSV 50% of session)   Blood pressure (!) 119/59, pulse 84, temperature 98.4 F (36.9 C), temperature source Oral, resp. rate 18, height 5' 1" (1.549 m), weight 58.1 kg (128 lb 1.4 oz), SpO2 98 %. Physical Exam  Nursing note and vitals reviewed. Constitutional: She is oriented to person, place, and time. She appears well-developed and well-nourished. No distress.  HENT:  Head: Normocephalic and atraumatic.  Mouth/Throat: Oropharynx is clear and moist.  Eyes: Conjunctivae and EOM are normal. Pupils are equal, round, and reactive to light.  Neck: Normal range of motion. Neck supple.  Cardiovascular: Normal rate and regular rhythm.   Respiratory: Effort normal. She has no wheezes. She exhibits tenderness.  Bouts of coughing thorough out the exam.  Upper airway sounds  GI: Soft. Bowel sounds are normal. She exhibits no distension. There is tenderness.  Musculoskeletal: She exhibits tenderness (LUE-AVG  tender to touch. NO warmth or edema. LLE donor site with resolving ecchymosis. ). She exhibits no edema.  Neurological: She is alert and oriented to person, place, and time.  Dysphonic Able to follow simple motor commands without difficulty.  Motor: 4/5 throughout Sensation intact to light touch  Skin: Skin is warm and dry. She is not   diaphoretic. No erythema.  Incision healing  Psychiatric: Her affect is blunt. She is slowed.    Results for orders placed or performed during the hospital encounter of 08/18/16 (from the past 48 hour(s))  Glucose, capillary     Status: Abnormal   Collection Time: 10/09/16  4:13 PM  Result Value Ref Range   Glucose-Capillary 54 (L) 65 - 99 mg/dL   Comment 1 Notify RN    Comment 2 Document in Chart   Glucose, capillary     Status: Abnormal    Collection Time: 10/09/16  4:50 PM  Result Value Ref Range   Glucose-Capillary 180 (H) 65 - 99 mg/dL   Comment 1 Notify RN    Comment 2 Document in Chart   Glucose, capillary     Status: Abnormal   Collection Time: 10/09/16  8:32 PM  Result Value Ref Range   Glucose-Capillary 234 (H) 65 - 99 mg/dL   Comment 1 Document in Chart   Glucose, capillary     Status: None   Collection Time: 10/09/16 11:49 PM  Result Value Ref Range   Glucose-Capillary 92 65 - 99 mg/dL  Glucose, capillary     Status: Abnormal   Collection Time: 10/10/16  3:46 AM  Result Value Ref Range   Glucose-Capillary 146 (H) 65 - 99 mg/dL   Comment 1 Document in Chart   Protime-INR     Status: Abnormal   Collection Time: 10/10/16  3:56 AM  Result Value Ref Range   Prothrombin Time 29.9 (H) 11.4 - 15.2 seconds   INR 2.78   CBC     Status: Abnormal   Collection Time: 10/10/16  3:56 AM  Result Value Ref Range   WBC 18.7 (H) 4.0 - 10.5 K/uL   RBC 3.99 3.87 - 5.11 MIL/uL   Hemoglobin 11.1 (L) 12.0 - 15.0 g/dL   HCT 34.6 (L) 36.0 - 46.0 %   MCV 86.7 78.0 - 100.0 fL   MCH 27.8 26.0 - 34.0 pg   MCHC 32.1 30.0 - 36.0 g/dL   RDW 16.2 (H) 11.5 - 15.5 %   Platelets 529 (H) 150 - 400 K/uL  Renal function panel     Status: Abnormal   Collection Time: 10/10/16  3:56 AM  Result Value Ref Range   Sodium 133 (L) 135 - 145 mmol/L   Potassium 4.0 3.5 - 5.1 mmol/L   Chloride 91 (L) 101 - 111 mmol/L   CO2 24 22 - 32 mmol/L   Glucose, Bld 132 (H) 65 - 99 mg/dL   BUN 82 (H) 6 - 20 mg/dL   Creatinine, Ser 5.88 (H) 0.44 - 1.00 mg/dL   Calcium 9.1 8.9 - 10.3 mg/dL   Phosphorus 7.2 (H) 2.5 - 4.6 mg/dL   Albumin 2.9 (L) 3.5 - 5.0 g/dL   GFR calc non Af Amer 7 (L) >60 mL/min   GFR calc Af Amer 8 (L) >60 mL/min    Comment: (NOTE) The eGFR has been calculated using the CKD EPI equation. This calculation has not been validated in all clinical situations. eGFR's persistently <60 mL/min signify possible Chronic Kidney Disease.     Anion gap 18 (H) 5 - 15  Parathyroid hormone, intact (no Ca)     Status: Abnormal   Collection Time: 10/10/16  3:56 AM  Result Value Ref Range   PTH 199 (H) 15 - 65 pg/mL    Comment: (NOTE) Performed At: [facility] [address] [place], [place] [id number] [place]   MD [id number]   Iron and TIBC     Status: Abnormal   Collection Time: 10/10/16  3:56 AM  Result Value Ref Range   Iron 22 (L) 28 - 170 ug/dL   TIBC 326 250 - 450 ug/dL   Saturation Ratios 7 (L) 10.4 - 31.8 %   UIBC 304 ug/dL  Ferritin     Status: None   Collection Time: 10/10/16  3:56 AM  Result Value Ref Range   Ferritin 272 11 - 307 ng/mL  Glucose, capillary     Status: Abnormal   Collection Time: 10/10/16  4:55 AM  Result Value Ref Range   Glucose-Capillary 130 (H) 65 - 99 mg/dL   Comment 1 Document in Chart   Glucose, capillary     Status: Abnormal   Collection Time: 10/10/16 11:10 AM  Result Value Ref Range   Glucose-Capillary 44 (LL) 65 - 99 mg/dL   Comment 1 Notify RN    Comment 2 Document in Chart   Glucose, capillary     Status: Abnormal   Collection Time: 10/10/16 11:34 AM  Result Value Ref Range   Glucose-Capillary 51 (L) 65 - 99 mg/dL   Comment 1 Notify RN    Comment 2 Document in Chart   Glucose, capillary     Status: Abnormal   Collection Time: 10/10/16 12:26 PM  Result Value Ref Range   Glucose-Capillary 208 (H) 65 - 99 mg/dL   Comment 1 Notify RN    Comment 2 Document in Chart   Glucose, capillary     Status: Abnormal   Collection Time: 10/10/16  4:21 PM  Result Value Ref Range   Glucose-Capillary 144 (H) 65 - 99 mg/dL   Comment 1 Notify RN    Comment 2 Document in Chart   Glucose, capillary     Status: Abnormal   Collection Time: 10/10/16  8:17 PM  Result Value Ref Range   Glucose-Capillary 260 (H) 65 - 99 mg/dL  Glucose, capillary     Status: Abnormal   Collection Time: 10/11/16 12:09 AM  Result Value Ref Range   Glucose-Capillary 169 (H) 65 - 99 mg/dL    Protime-INR     Status: Abnormal   Collection Time: 10/11/16  3:48 AM  Result Value Ref Range   Prothrombin Time 34.8 (H) 11.4 - 15.2 seconds   INR 3.35   Renal function panel     Status: Abnormal   Collection Time: 10/11/16  3:48 AM  Result Value Ref Range   Sodium 133 (L) 135 - 145 mmol/L   Potassium 3.4 (L) 3.5 - 5.1 mmol/L   Chloride 94 (L) 101 - 111 mmol/L   CO2 26 22 - 32 mmol/L   Glucose, Bld 53 (L) 65 - 99 mg/dL   BUN 22 (H) 6 - 20 mg/dL   Creatinine, Ser 3.44 (H) 0.44 - 1.00 mg/dL    Comment: DELTA CHECK NOTED   Calcium 8.5 (L) 8.9 - 10.3 mg/dL   Phosphorus 5.3 (H) 2.5 - 4.6 mg/dL   Albumin 2.6 (L) 3.5 - 5.0 g/dL   GFR calc non Af Amer 14 (L) >60 mL/min   GFR calc Af Amer 16 (L) >60 mL/min    Comment: (NOTE) The eGFR has been calculated using the CKD EPI equation. This calculation has not been validated in all clinical situations. eGFR's persistently <60 mL/min signify possible Chronic Kidney Disease.    Anion gap 13 5 - 15  CBC     Status: Abnormal  Collection Time: 10/11/16  3:48 AM  Result Value Ref Range   WBC 15.1 (H) 4.0 - 10.5 K/uL   RBC 3.81 (L) 3.87 - 5.11 MIL/uL   Hemoglobin 10.3 (L) 12.0 - 15.0 g/dL   HCT 33.5 (L) 36.0 - 46.0 %   MCV 87.9 78.0 - 100.0 fL   MCH 27.0 26.0 - 34.0 pg   MCHC 30.7 30.0 - 36.0 g/dL   RDW 16.2 (H) 11.5 - 15.5 %   Platelets 475 (H) 150 - 400 K/uL  Glucose, capillary     Status: Abnormal   Collection Time: 10/11/16  4:16 AM  Result Value Ref Range   Glucose-Capillary 55 (L) 65 - 99 mg/dL  Glucose, capillary     Status: Abnormal   Collection Time: 10/11/16  4:50 AM  Result Value Ref Range   Glucose-Capillary 150 (H) 65 - 99 mg/dL  Glucose, capillary     Status: None   Collection Time: 10/11/16  8:01 AM  Result Value Ref Range   Glucose-Capillary 96 65 - 99 mg/dL  Glucose, capillary     Status: Abnormal   Collection Time: 10/11/16 11:29 AM  Result Value Ref Range   Glucose-Capillary 194 (H) 65 - 99 mg/dL         Medical Problem List and Plan: 1.  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 2.  DVT Prophylaxis/Anticoagulation: Pharmaceutical: Coumadin 3. Pain Management: Hydrocodone prn effective. 4. Mood: LCSW to follow for evaluation and support.  5. Neuropsych: This patient is not fully capable of making decisions on her own behalf. 6. Skin/Wound Care: Routine pressure relief measures.  7. Fluids/Electrolytes/Nutrition: Strict I/O. Renal panel TTSa with HD.   Advance diet as tolerated 8. Leucocytosis: WBC continues to fluctuate--up to 17.0 today  On cipro for bronchitis  10 T2DM:Hgb A1c- 13.3. Currently on Levemir bid --diet advanced to regular with thin liquids. Continues to have hypoglycemic episodes daily due to variable intake. Will monitor blood sugars ac/hs with SSI for elevated BS 11. ESRD: Now HD dependent--to schedule HD in afternoons to help with tolerance of therapy during the day. Educate patient and family on renal diet. Electrolyte abnormalities being managed during HD.  12. Anemia of chronic disease: H/H ranging between 9.5 to 10.6. Monitor for signs of bleeding. On aranesp for erythropoiesis and nulecit for iron load 13. CAD s/p DES X 2 and MVR: Monitor for symptoms with increase in activity. On plavix and Lipitor.  14. Acute respiratory failure due to acute pulmonary edema:  Fluid overload compensated with HD.  15. AFlutter s/p DCCV/PAF: HR controlled on amiodarone. On coumadin.  16. Persistent cough with dysphonia: Off antibiotics. Continue robinul for secretions and Robitussin DM for cough. Encourage IS with use of flutter valve 17. Sleep disturbance: managed with benadryl prn    Post Admission Physician Evaluation: Preadmission assessment reviewed and changes made below. Functional deficits secondary  to debility from multiply cardial and pulmonary issues. 1. Patient is admitted to  receive collaborative, interdisciplinary care between the physiatrist, rehab nursing staff, and therapy team. 2. Patient's level of medical complexity and substantial therapy needs in context of that medical necessity cannot be provided at a lesser intensity of care such as a SNF. 3. Patient has experienced substantial functional loss from his/her baseline which was documented above under the "Functional History" and "Functional Status" headings.  Judging by the patient's diagnosis, physical exam, and functional history, the patient has potential for functional progress which   will result in measurable gains while on inpatient rehab.  These gains will be of substantial and practical use upon discharge  in facilitating mobility and self-care at the household level. 4. Physiatrist will provide 24 hour management of medical needs as well as oversight of the therapy plan/treatment and provide guidance as appropriate regarding the interaction of the two. 5. 24 hour rehab nursing will assist with bladder management, bowel management, safety, skin/wound care, disease management, medication administration, pain management and patient education  and help integrate therapy concepts, techniques,education, etc. 6. PT will assess and treat for/with: Lower extremity strength, range of motion, stamina, balance, functional mobility, safety, adaptive techniques and equipment, woundcare, coping skills, pain control, cardiac education.   Goals are: Supervision/Min A. 7. OT will assess and treat for/with: ADL's, functional mobility, safety, upper extremity strength, adaptive techniques and equipment, wound mgt, ego support, and community reintegration.   Goals are: Supervision/Min A. Therapy may not proceed with showering this patient. 8. SLP will assess and treat for/with: cognition, swallowing, speech.  Goals are: Supervision/Mod I. 9. Case Management and Social Worker will assess and treat for psychological issues and  discharge planning. 10. Team conference will be held weekly to assess progress toward goals and to determine barriers to discharge. 11. Patient will receive at least 3 hours of therapy per day at least 5 days per week. 12. ELOS: 18-21 days.       13. Prognosis:  good  Ankit Patel, MD, FAAPMR 10/11/2016  

## 2016-10-11 NOTE — Progress Notes (Signed)
Deborah Lorie Phenix, MD Physician Signed Physical Medicine and Rehabilitation  Consult Note Date of Service: 09/20/2016 8:12 AM  Related encounter: Admission (Current) from 08/18/2016 in [place] All Collapse All   [] Hide copied text [] Hover for attribution information      Physical Medicine and Rehabilitation Consult   Reason for Consult: Debility due to multiple medical issues.  Referring Physician: Dr. Prescott Gum.   HPI: Deborah [name] is a 60 y.o. female with history of T2DM, HTN, CKD stage III with medical non-compliance who was originally admitted to [facility] 08/15/16 with NSTEMI and underwent cardiac cath with stenting. History taken from chart review. She developed acute renal failure with metabolic acidosis and oliguria requiring CRRT. She went on to develop cardiogenic shock requiring intubation and was transferred to [facility] on 08/18/16 for treatment. 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 She underwent mitral valve repair by Prescott Gum on 9/21.  Right IJ placed on 9/28 and HD initiated.  She was extubated on 9/29 but reintubated due to septic shock due to staph bacteremia and required pressors for support.  Afib treated with TEE DCCV and in NSR on amiodarone.  She underwent tracheostomy on 10/5 and was weaned to ATC and tolerating PMSV for short periods of time. Has completed treatment for enterobacter HCAP but continues to have thick secretions. She remains NPO and was noted to be significantly deconditioned. PT/OT working on pregait activity--patient noted to have difficulty following one step commands with decreased memory, cognitive deficits and able to sit at EOB for 3 minutes. CIR recommended for follow up therapy.     Review of Systems  Constitutional: Positive for malaise/fatigue (PTA).  HENT: Negative for hearing loss.   Eyes: Negative for blurred vision and double vision.    Respiratory: Positive for cough and sputum production. Negative for shortness of breath.   Gastrointestinal: Negative for abdominal pain, heartburn and nausea.  Musculoskeletal: Negative for back pain, joint pain, myalgias and neck pain.  Neurological: Positive for focal weakness (RLE pain (weakness?) for past few years). Negative for dizziness, tingling and headaches.  Psychiatric/Behavioral: Positive for depression. The patient is nervous/anxious.          Past Medical History:  Diagnosis Date  . Diabetes mellitus   . Hyperlipidemia   . Hypertension          Past Surgical History:  Procedure Laterality Date  . CARDIAC CATHETERIZATION N/A 08/15/2016   Procedure: Left Heart Cath and Coronary Angiography;  Surgeon: Yolonda Kida, MD;  Location: [place] [facility];  Service: Cardiovascular;  Laterality: N/A;  . CARDIAC CATHETERIZATION N/A 08/15/2016   Procedure: Coronary Stent Intervention;  Surgeon: Yolonda Kida, MD;  Location: [place] [facility];  Service: Cardiovascular;  Laterality: N/A;  . CARDIAC CATHETERIZATION N/A 08/18/2016   Procedure: Right Heart Cath;  Surgeon: Jolaine Artist, MD;  Location: [place] [facility];  Service: Cardiovascular;  Laterality: N/A;  . CARDIAC CATHETERIZATION N/A 08/18/2016   Procedure: IABP Insertion;  Surgeon: Jolaine Artist, MD;  Location: [place] [facility];  Service: Cardiovascular;  Laterality: N/A;  . CARDIAC CATHETERIZATION Right 08/23/2016   Procedure: CENTRAL LINE INSERTION RIGHT SUBCLAVIAN;  Surgeon: Ivin Poot, MD;  Location: [place];  Service: Open Heart Surgery;  Laterality: Right;  . ENDOVEIN HARVEST OF GREATER SAPHENOUS VEIN Left 08/23/2016   Procedure: ENDOVEIN HARVEST OF GREATER SAPHENOUS VEIN;  Surgeon: Ivin Poot, MD;  Location: [place];  Service: Open Heart Surgery;  Laterality: Left;  . INSERTION OF DIALYSIS CATHETER Bilateral 08/31/2016   Procedure: INSERTION OF DIALYSIS CATHETER LEFT INTERNAL  JUGULAR VEIN & INSERTION OF TRIPLE LUMEN RIGHT INTERNAL JUGULAR VEIN;  Surgeon: Angelia Mould, MD;  Location: [place];  Service: Vascular;  Laterality: Bilateral;  . INTRAOPERATIVE TRANSESOPHAGEAL ECHOCARDIOGRAM N/A 08/23/2016   Procedure: INTRAOPERATIVE TRANSESOPHAGEAL ECHOCARDIOGRAM;  Surgeon: Ivin Poot, MD;  Location: [place];  Service: Open Heart Surgery;  Laterality: N/A;  . MITRAL VALVE REPAIR N/A 08/23/2016   Procedure: MITRAL VALVE REPAIR (MVR) USING 25MM EDWARDS MAGNA EASE BIOPROSTHESIS MITRAL  VALVE;  Surgeon: Ivin Poot, MD;  Location: [place];  Service: Open Heart Surgery;  Laterality: N/A;  . VAGINAL DELIVERY     x 6    Family History  Problem Relation Age of Onset  . Hypertension Mother   . Diabetes Mother   . Breast cancer Sister     Social History:  Lives with son and helps manage his home. 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 Has multiple family member in [place].  She reports that she has never smoked. She has never used smokeless tobacco. She reports that she does not drink alcohol or use drugs.    Allergies: No Known Allergies          Medications Prior to Admission  Medication Sig Dispense Refill  . amLODipine (NORVASC) 10 MG tablet Take 1 tablet (10 mg total) by mouth daily. 90 tablet 0  . aspirin EC 81 MG tablet Take 81 mg by mouth every other day.    Marland Kitchen atorvastatin (LIPITOR) 20 MG tablet Take 1 tablet (20 mg total) by mouth daily. 30 tablet 1  . ranitidine (ZANTAC) 150 MG tablet TAKE ONE TABLET BY MOUTH TWICE DAILY 180 tablet 1  . [EXPIRED] ticagrelor (BRILINTA) 90 MG TABS tablet Take 1 tablet (90 mg total) by mouth 2 (two) times daily. 60 tablet 6  . [DISCONTINUED] metFORMIN (GLUCOPHAGE) 500 MG tablet Take 500 mg by mouth 2 (two) times daily with a meal.    . [DISCONTINUED] metoprolol (LOPRESSOR) 100 MG tablet Take 100 mg by mouth 2 (two) times daily.    .  [DISCONTINUED] valsartan-hydrochlorothiazide (DIOVAN-HCT) 320-25 MG tablet Take 1 tablet by mouth daily.      Home: Home Living Family/patient expects to be discharged to:: Unsure Living Arrangements: Alone Available Help at Discharge: Family, Available PRN/intermittently Type of Home: House Additional Comments: pt on vent and trach and unable to provide all PLOF and home environment at this time.   Functional History: Prior Function Level of Independence: Independent Functional Status:  Mobility: Bed Mobility Overal bed mobility: Needs Assistance Bed Mobility: Rolling, Sidelying to Sit Rolling: Mod assist Sidelying to sit: Mod assist, +2 for safety/equipment Supine to sit: Max assist, +2 for physical assistance General bed mobility comments: cues for sequence and safety with assist to rotate trunk and rise from surface Transfers Overall transfer level: Needs assistance Equipment used: 2 person hand held assist Transfers: Sit to/from Stand, Stand Pivot Transfers Sit to Stand: Mod assist, +2 physical assistance, +2 safety/equipment Stand pivot transfers: Mod assist, +2 safety/equipment, +2 physical assistance General transfer comment: pt stood x 3 with trials of grossly 15 sec, 40 sec, 10 sec with standing marching on 1st trial. assist to advance RLE with pivot to chair and cues throughout with bil UE support for each  trial and assist for anterior translation and rise from surface      ADL: ADL Overall ADL's : Needs assistance/impaired Eating/Feeding: NPO Grooming: Wash/dry hands, Wash/dry face, Moderate assistance, Sitting Upper Body Bathing: Maximal assistance, Sitting Lower Body Bathing: Total assistance, Sit to/from stand Upper Body Dressing : Total assistance, Sitting Lower Body Dressing: Total assistance, Sit to/from stand Toilet Transfer: Moderate assistance, +2 for physical assistance, Stand-pivot, BSC Toileting- Clothing Manipulation and Hygiene: Total  assistance, Sit to/from stand Functional mobility during ADLs: Moderate assistance, +2 for physical assistance  Cognition: Cognition Overall Cognitive Status: Impaired/Different from baseline Orientation Level: (P) Intubated/Tracheostomy - Unable to assess Cognition Arousal/Alertness: Awake/alert Behavior During Therapy: Anxious Overall Cognitive Status: Impaired/Different from baseline Area of Impairment: Attention, Following commands, Safety/judgement, Problem solving, Memory Current Attention Level: Sustained Memory: Decreased recall of precautions, Decreased short-term memory Following Commands: Follows one step commands inconsistently Safety/Judgement: Decreased awareness of safety Problem Solving: Slow processing, Requires verbal cues, Difficulty sequencing General Comments: pt educated for sternal precautions Difficult to assess due to: Tracheostomy  Blood pressure 109/69, pulse 71, temperature 97.8 F (36.6 C), temperature source Oral, resp. rate 17, height 5' 1"  (1.549 m), weight 63.3 kg (139 lb 8.8 oz), SpO2 95 %. Physical Exam  Nursing note and vitals reviewed. Constitutional: She appears well-developed and well-nourished.  Lying in bed with mittens in place.   HENT:  Head: Normocephalic and atraumatic.  Eyes: Conjunctivae and EOM are normal. Pupils are equal, round, and reactive to light.  Neck: Normal range of motion.  Trach in place with ATC.   Cardiovascular: Normal rate.  An irregular rhythm present.  Murmur heard. Respiratory: Effort normal. No stridor. She has decreased breath sounds. She has rhonchi. She exhibits tenderness.  Congested upper airway sounds.   GI: Soft. Bowel sounds are normal. She exhibits no distension. There is no tenderness.  Rectal tube in place.   Genitourinary:  Genitourinary Comments: +Rectal tube  Musculoskeletal: She exhibits no edema or tenderness.  Dry dressing LLE with bruising at donor site.   Neurological: She is alert.    Dysphonia noted but able to vocalize occasionally without trach occluded.  Able to state age, DOB and needed cues for month.  Able to follow simple one step commands for the most part, but does have difficulty at times.  Mild delay in processing and needed redirection.  Motor: B/l UE 4+/5 LLE: Hip flexion 4/5, knee extension 4+/5, ankle dorsi/plantar flexion 4+/5 RLE: 3-/5 proximally, 4/5 distally DTRs 4+ RLE.   Skin: Skin is warm and dry.  Psychiatric: Her affect is blunt. Her speech is delayed. She is slowed.    Lab Results Last 24 Hours        Results for orders placed or performed during the hospital encounter of 08/18/16 (from the past 24 hour(s))  Glucose, capillary     Status: Abnormal   Collection Time: 09/19/16 12:04 PM  Result Value Ref Range   Glucose-Capillary 188 (H) 65 - 99 mg/dL   Comment 1 Capillary Specimen    Comment 2 Notify RN   Culture, respiratory (NON-Expectorated)     Status: None (Preliminary result)   Collection Time: 09/19/16  1:00 PM  Result Value Ref Range   Specimen Description TRACHEAL ASPIRATE    Special Requests NONE    Gram Stain      FEW WBC PRESENT, PREDOMINANTLY PMN FEW GRAM POSITIVE COCCI IN PAIRS AND CHAINS RARE GRAM POSITIVE COCCI IN CLUSTERS RARE YEAST   Culture PENDING    Report  Status PENDING   Glucose, capillary     Status: Abnormal   Collection Time: 09/19/16  7:32 PM  Result Value Ref Range   Glucose-Capillary 114 (H) 65 - 99 mg/dL   Comment 1 Capillary Specimen    Comment 2 Notify RN    Comment 3 Document in Chart   Glucose, capillary     Status: Abnormal   Collection Time: 09/19/16 11:42 PM  Result Value Ref Range   Glucose-Capillary 270 (H) 65 - 99 mg/dL   Comment 1 Capillary Specimen    Comment 2 Notify RN    Comment 3 Document in Chart   Glucose, capillary     Status: Abnormal   Collection Time: 09/20/16  3:56 AM  Result Value Ref Range   Glucose-Capillary 103 (H) 65 - 99  mg/dL   Comment 1 Capillary Specimen    Comment 2 Notify RN    Comment 3 Document in Chart   Cooxemetry Panel (carboxy, met, total hgb, O2 sat)     Status: Abnormal   Collection Time: 09/20/16  4:30 AM  Result Value Ref Range   Total hemoglobin 8.9 (L) 12.0 - 16.0 g/dL   O2 Saturation 58.5 %   Carboxyhemoglobin 1.3 0.5 - 1.5 %   Methemoglobin 1.0 0.0 - 1.5 %  CBC     Status: Abnormal   Collection Time: 09/20/16  5:00 AM  Result Value Ref Range   WBC 22.0 (H) 4.0 - 10.5 K/uL   RBC 3.06 (L) 3.87 - 5.11 MIL/uL   Hemoglobin 8.9 (L) 12.0 - 15.0 g/dL   HCT 28.3 (L) 36.0 - 46.0 %   MCV 92.5 78.0 - 100.0 fL   MCH 29.1 26.0 - 34.0 pg   MCHC 31.4 30.0 - 36.0 g/dL   RDW 17.0 (H) 11.5 - 15.5 %   Platelets 400 150 - 400 K/uL  Renal function panel     Status: Abnormal   Collection Time: 09/20/16  5:00 AM  Result Value Ref Range   Sodium 131 (L) 135 - 145 mmol/L   Potassium 4.0 3.5 - 5.1 mmol/L   Chloride 96 (L) 101 - 111 mmol/L   CO2 25 22 - 32 mmol/L   Glucose, Bld 104 (H) 65 - 99 mg/dL   BUN 31 (H) 6 - 20 mg/dL   Creatinine, Ser 1.94 (H) 0.44 - 1.00 mg/dL   Calcium 8.3 (L) 8.9 - 10.3 mg/dL   Phosphorus 1.8 (L) 2.5 - 4.6 mg/dL   Albumin 2.4 (L) 3.5 - 5.0 g/dL   GFR calc non Af Amer 27 (L) >60 mL/min   GFR calc Af Amer 31 (L) >60 mL/min   Anion gap 10 5 - 15  Protime-INR     Status: Abnormal   Collection Time: 09/20/16  5:19 AM  Result Value Ref Range   Prothrombin Time 22.1 (H) 11.4 - 15.2 seconds   INR 1.90       Imaging Results (Last 48 hours)  Dg Abd 1 View  Result Date: 09/18/2016 CLINICAL DATA:  Feeding tube placement. EXAM: ABDOMEN - 1 VIEW COMPARISON:  09/16/2016 . FINDINGS: Feeding tube noted with tip projected over the third portion of the duodenum. Distended loops of small bowel and large bowel are noted. No free air. Prior cardiac valve replacement. Bibasilar atelectasis and bilateral pleural effusions noted . IMPRESSION: 1.  Feeding tube noted with tip projected third portion the duodenum. Distended loops of small and large bowel are noted. No free air. 2. Prior cardiac valve  replacement. Bibasilar atelectasis and bilateral pleural effusions. Electronically Signed   By: Marcello Moores  Register   On: 09/18/2016 09:16   Dg Chest Port 1 View  Result Date: 09/19/2016 CLINICAL DATA:  Shortness of breath EXAM: PORTABLE CHEST 1 VIEW COMPARISON:  September 18, 2016 FINDINGS: Tracheostomy catheter tip is 5.8 cm above the carina. Dual-lumen central catheter tip is at the cavoatrial junction. Right subclavian catheter tip is also at the cavoatrial junction. Feeding tube tip is below the diaphragm. No pneumothorax. There is patchy airspace consolidation in both lower lobes with bilateral pleural effusions. There is also patchy atelectasis in the left lower lobe peripherally, stable. There is cardiomegaly with pulmonary venous hypertension. Atherosclerotic calcification is noted in the aorta. The patient is status post mitral valve replacement. IMPRESSION: Tube and catheter positions as described without pneumothorax. There is a degree of congestive heart failure with bilateral lower lobe airspace consolidation consistent with either alveolar edema or superimposed pneumonia. Both entities may exist concurrently. Appearance is essentially stable compared to 1 day prior. Electronically Signed   By: Lowella Grip III M.D.   On: 09/19/2016 08:32     Assessment/Plan: Diagnosis: Debility due to multiple medical issues Labs and images independently reviewed.  Records reviewed and summated above.  1. Does the need for close, 24 hr/day medical supervision in concert with the patient's rehab needs make it unreasonable for this patient to be served in a less intensive setting? Yes  2. Co-Morbidities requiring supervision/potential complications: T2 DM (Monitor in accordance with exercise and adjust meds as necessary), HTN (monitor and provide prns  in accordance with increased physical exertion and pain), AKI on CKD stage III (avoid nephrotoxic meds), medical non-compliance (cont to educate), NSTEMI (cont meds), MR, CHF (Monitor in accordance with increased physical activity and avoid UE resistance excercises), Afib (monitor HR with increased physical activity), tachypnea (monitor RR and O2 Sats with increased physical exertion), hyponatremia (cont to monitor, treat if necessary), leukocytosis (cont to monitor for signs and symptoms of infection, further workup if indicated), ABLA (transfuse if necessary to ensure appropriate perfusion for increased activity tolerance) 3. Due to bladder management, bowel management, safety, skin/wound care, disease management, medication administration, pain management and patient education, does the patient require 24 hr/day rehab nursing? Yes 4. Does the patient require coordinated care of a physician, rehab nurse, PT (1-2 hrs/day, 5 days/week), OT (1-2 hrs/day, 5 days/week) and SLP (1-2 hrs/day, 5 days/week) to address physical and functional deficits in the context of the above medical diagnosis(es)? Yes Addressing deficits in the following areas: balance, endurance, locomotion, strength, transferring, bowel/bladder control, bathing, dressing, feeding, toileting, speech, swallowing and psychosocial support 5. Can the patient actively participate in an intensive therapy program of at least 3 hrs of therapy per day at least 5 days per week? Potentially 6. The potential for patient to make measurable gains while on inpatient rehab is excellent 7. Anticipated functional outcomes upon discharge from inpatient rehab are supervision and min assist  with PT, supervision and min assist with OT, modified independent and supervision with SLP. 8. Estimated rehab length of stay to reach the above functional goals is: 18-21 days. 9. Does the patient have adequate social supports and living environment to accommodate these  discharge functional goals? Potentially 10. Anticipated D/C setting: Home 11. Anticipated post D/C treatments: HH therapy and Home excercise program 12. Overall Rehab/Functional Prognosis: good  RECOMMENDATIONS: This patient's condition is appropriate for continued rehabilitative care in the following setting: CIR if caregiver support available upon  discharge once medically stable. Patient has agreed to participate in recommended program. Potentially Note that insurance prior authorization may be required for reimbursement for recommended care.  Comment: Rehab Admissions Coordinator to follow up.  Delice Lesch, MD, [facility] 09/20/2016    Revision History                        Routing History

## 2016-10-11 NOTE — Progress Notes (Signed)
Modified Barium Swallow Progress Note  Patient Details  Name: Deborah [name] MRN: [id number] Date of Birth: 1956-01-02  Today's Date: 10/11/2016  Modified Barium Swallow completed.  Full report located under Chart Review in the Imaging Section.  Brief recommendations include the following:  Clinical Impression  Pt presents with mild pharyngeal and mild cervical esophageal dysphagia, with suspected primary esophageal dysphagia. She had a baseline, congested cough throughout MBS that was not associated with aspiration. Mild piecemeal swallowing observed with pureed and regular solids. A delay in swallow initation to the valleculae with reduced laryngeal closure seen for all PO intake. Given thin liquid, pt had a single event of silent aspiration, but when utilizing a chin tuck intermittent flash penetration occurred with no penetrate/aspirate residuals. Following multiple thin liquid trials, pt's strong cough resulted in retrograde backflow of boluses from the esophagus to the level of the valleculae. Retrograde backflow intermittently occurred into the pharynx with more solid trials; however, her airway remained protected throughout these events. Esophageal sweep showed mild build up of consistencies (MD not present to confirm). Recommend advancement to Dys 3 and thin liquids utilizing a chin tuck for every sip. MD may wish to consider further esophageal assessment given findings on most recent two MBS, which impacts pt's risk for post-prandial aspiration and may be influencing some of her coughing. Will continue to follow acutely for diet tolerance.   Swallow Evaluation Recommendations      SLP Diet Recommendations: Dysphagia 3 (Mech soft) solids;Thin liquid   Liquid Administration via: Cup;No straw   Medication Administration: Crushed with puree   Supervision: Patient able to self feed;Full supervision/cueing for compensatory strategies   Compensations: Minimize environmental  distractions;Slow rate;Small sips/bites;Chin tuck;Follow solids with liquid   Postural Changes: Remain semi-upright after after feeds/meals (Comment);Seated upright at 90 degrees   Oral Care Recommendations: Oral care BID       Ezekiel Slocumb, Student SLP  Shela Leff 10/11/2016,12:18 PM

## 2016-10-11 NOTE — Care Management Note (Signed)
Case Management Note Previous CM note initiated by Zenon Mayo, RN-10/06/2016, 3:29 PM   Patient Details  Name: Deborah [name] MRN: [id number] Date of Birth: 1956/09/28  Subjective/Objective:   Patient for dc to CIR . NCM spoke with Melissa in [place], she states the MD would like to see wbc trending down and need a definite plan for cipro before CIR can take patient.  NCM relayed this to the RN and she relayed message to [facility].                  Action/Plan:   Expected Discharge Date:    10/11/16              Expected Discharge Plan:  [place]  In-House Referral:  Clinical Social Work  Discharge planning Services  CM Consult  Post Acute Care Choice:    Choice offered to:     DME Arranged:    DME Agency:     HH Arranged:    [place] Agency:     Status of Service:  Completed, signed off  If discussed at [employer] [facility] [employer], dates discussed:    Discharge Disposition: [facility]   Additional Comments:  10/11/16- 56- Deborah Fakhouri RN CM- have heard from [place] with IP rehab- they have a bed today- and will plan to admit pt today to [facility]- MD has agreed to d/c plan.   Deborah Client [place], RN 10/11/2016, 11:08 AM [phone]

## 2016-10-11 NOTE — Progress Notes (Addendum)
[address]       [place] [address]             [phone]      9 Days Post-Op Procedure(s) (LRB): INSERTION OF ARTERIOVENOUS (AV) GORE-TEX GRAFT ARM (Left) Subjective: Feels so so this morning  Objective: Vital signs in last 24 hours: Temp:  [97.8 F (36.6 C)-98.2 F (36.8 C)] 98.2 F (36.8 C) (11/08 0422) Pulse Rate:  [80-92] 85 (11/08 0422) Cardiac Rhythm: Normal sinus rhythm (11/07 2004) Resp:  [17-22] 18 (11/08 0422) BP: (93-119)/(46-64) 114/62 (11/08 0422) SpO2:  [97 %-99 %] 99 % (11/08 0422) Weight:  [123 lb 7.3 oz (56 kg)-128 lb 1.4 oz (58.1 kg)] 128 lb 1.4 oz (58.1 kg) (11/08 0422)     Intake/Output from previous day: No intake/output data recorded. Intake/Output this shift: No intake/output data recorded.  General appearance: alert, cooperative and no distress Heart: regular rate and rhythm Lungs: clear to auscultation bilaterally Abdomen: soft, non-tender; bowel sounds normal; no masses,  no organomegaly Extremities: extremities normal, atraumatic, no cyanosis or edema Wound: clean and dry  Lab Results:  Recent Labs  10/10/16 0356 10/11/16 0348  WBC 18.7* 15.1*  HGB 11.1* 10.3*  HCT 34.6* 33.5*  PLT 529* 475*   BMET:  Recent Labs  10/10/16 0356 10/11/16 0348  NA 133* 133*  K 4.0 3.4*  CL 91* 94*  CO2 24 26  GLUCOSE 132* 53*  BUN 82* 22*  CREATININE 5.88* 3.44*  CALCIUM 9.1 8.5*    PT/INR:  Recent Labs  10/11/16 0348  LABPROT 34.8*  INR 3.35   ABG    Component Value Date/Time   PHART 7.487 (H) 10/04/2016 1430   HCO3 28.4 (H) 10/04/2016 1430   TCO2 26 09/08/2016 0412   ACIDBASEDEF 4.1 (H) 09/02/2016 1850   O2SAT 95.5 10/04/2016 1430   CBG (last 3)   Recent Labs  10/11/16 0009 10/11/16 0416 10/11/16 0450  GLUCAP 169* 55* 150*    Assessment/Plan: S/P Procedure(s) (LRB): INSERTION OF ARTERIOVENOUS (AV) GORE-TEX GRAFT ARM (Left)   1. CV - S/p STEMI, s/p PCI,s/p MV replacement. On Amiodarone  200 mg bid, Midodrine 10 mg q am, Plavix 75 mg daily,and Coumadin . INR increased to 3.35. No coumadin today 2.  Pulmonary - On room air. Tessalon Perles and Robitussin as needed.  Encourage incentive spirometer. 3. DM-CBGs 169/55/150 . On Metformin pre op. Only on Insulin post op l. Pre op HGA1C 13.3. Will need close medical follow up after discharge. Will discontinue night time meal coverage since no more night time tube feeds 4.  Anemia -H and H this am stable at 10.3 and 33.5 5. ESRD- HD every other day. Per Nephrology. 6. GI-Diet is dysphagia 2 (fine chop, nectar thick). Continue with nocturnal TFs and nutrition recommendations. Re-consult dietary for calorie count 7. Was on Cipro but cultures negative so stopped. WBC this am 15k. Remains afebrile. Monitor pulmonary status (history of PNA, productive cough).  Plan: discontinue night time insulin coverage since no more night tube feedings and hypoglycemic this morning. Re-consult dietary for calorie count and re-evaluation. No coumadin today for supratherapeutic INR.      LOS: 32 days    Elgie Collard 10/11/2016  patient examined and medical record reviewed,agree with above note. Tharon Aquas Trigt III 10/11/2016   Keep coumadin  at 2.5 mg after recent bump from 3 mg No evidence of pneumonia- cipro stopped nsr with stable BP Cough from prob tracheitis  related to prior trach- will improve with time, and repeat MBS requested to make sure she is on the correct diet   What she needs most at this point is Rehabilitative Therapies - PT, OT , Speech. She should be transferred when bed available

## 2016-10-11 NOTE — Progress Notes (Signed)
ANTICOAGULATION CONSULT NOTE - Initial Consult  Pharmacy Consult for Coumadin Indication: a. flutter  No Known Allergies  Patient Measurements: Height: 5\' 1"  (154.9 cm) Weight: 130 lb 1.1 oz (59 kg) IBW/kg (Calculated) : 47.8   Vital Signs: Temp: 98.4 F (36.9 C) (11/08 1633) Temp Source: Oral (11/08 1633) BP: 112/67 (11/08 1633) Pulse Rate: 88 (11/08 1633)  Labs:  Recent Labs  10/09/16 0256 10/10/16 0356 10/11/16 0348  HGB  --  11.1* 10.3*  HCT  --  34.6* 33.5*  PLT  --  529* 475*  LABPROT 23.9* 29.9* 34.8*  INR 2.10 2.78 3.35  CREATININE 4.59* 5.88* 3.44*    Estimated Creatinine Clearance: 14.5 mL/min (by C-G formula based on SCr of 3.44 mg/dL (H)).   Medical History: Past Medical History:  Diagnosis Date  . Diabetes mellitus   . Hyperlipidemia   . Hypertension     Medications:  Scheduled:  . amiodarone  200 mg Oral BID  . atorvastatin  20 mg Oral q1800  . benzonatate  100 mg Oral TID  . chlorhexidine  15 mL Mouth Rinse BID  . [START ON 10/12/2016] clopidogrel  75 mg Oral Daily  . [START ON 10/17/2016] darbepoetin (ARANESP) injection - DIALYSIS  100 mcg Intravenous Q Tue-HD  . [START ON 10/12/2016] famotidine  20 mg Oral Daily  . feeding supplement (NEPRO CARB STEADY)  1,000 mL Oral Daily  . [START ON 10/12/2016] feeding supplement (NEPRO CARB STEADY)  237 mL Oral Daily  . [START ON 10/12/2016] ferric gluconate (FERRLECIT/NULECIT) IV  250 mg Intravenous Q T,Th,Sa-HD  . Gerhardt's butt cream   Topical BID  . glycopyrrolate  1 mg Per Tube BID  . guaiFENesin  30 mL Per Tube Q6H  . hydrocortisone   Rectal BID  . insulin aspart  0-5 Units Subcutaneous QHS  . insulin aspart  0-9 Units Subcutaneous TID WC  . insulin detemir  5 Units Subcutaneous BID  . lanthanum  1,000 mg Oral TID WC  . [START ON 10/12/2016] midodrine  10 mg Oral q morning - 10a  . multivitamin  1 tablet Oral QHS  . [START ON 10/12/2016] multivitamin  15 mL Per Tube Daily  . phosphorus  500  mg Oral BID  . sodium chloride flush  10-40 mL Intracatheter Q12H  . [START ON 10/12/2016] warfarin  2.5 mg Oral q1800  . Warfarin - Physician Dosing Inpatient   Does not apply q1800    Assessment: 60 yo female with multiple medical issues just transferred to rehab after prolonged hospital course.  Pharmacy asked to dose Coumadin upon admission to rehab.  INR supratherapeutic today.  No bleeding or complications noted.  Goal of Therapy:  INR 2-2.5 Monitor platelets by anticoagulation protocol: Yes   Plan:  1. No Coumadin tonight. 2. Daily INR.  Uvaldo Rising, BCPS  Clinical Pharmacist Pager [phone]  10/11/2016 4:47 PM

## 2016-10-12 ENCOUNTER — Inpatient Hospital Stay (HOSPITAL_COMMUNITY): Payer: Self-pay | Admitting: Occupational Therapy

## 2016-10-12 ENCOUNTER — Encounter (HOSPITAL_COMMUNITY): Payer: Self-pay

## 2016-10-12 ENCOUNTER — Inpatient Hospital Stay (HOSPITAL_COMMUNITY): Payer: Self-pay | Admitting: Speech Pathology

## 2016-10-12 ENCOUNTER — Inpatient Hospital Stay (HOSPITAL_COMMUNITY): Payer: Medicaid Other | Admitting: Physical Therapy

## 2016-10-12 DIAGNOSIS — R490 Dysphonia: Secondary | ICD-10-CM

## 2016-10-12 DIAGNOSIS — Z794 Long term (current) use of insulin: Secondary | ICD-10-CM

## 2016-10-12 DIAGNOSIS — E1165 Type 2 diabetes mellitus with hyperglycemia: Secondary | ICD-10-CM

## 2016-10-12 DIAGNOSIS — D72820 Lymphocytosis (symptomatic): Secondary | ICD-10-CM

## 2016-10-12 DIAGNOSIS — G9341 Metabolic encephalopathy: Secondary | ICD-10-CM

## 2016-10-12 DIAGNOSIS — I2510 Atherosclerotic heart disease of native coronary artery without angina pectoris: Secondary | ICD-10-CM

## 2016-10-12 DIAGNOSIS — R5381 Other malaise: Principal | ICD-10-CM

## 2016-10-12 DIAGNOSIS — Z992 Dependence on renal dialysis: Secondary | ICD-10-CM

## 2016-10-12 DIAGNOSIS — N186 End stage renal disease: Secondary | ICD-10-CM

## 2016-10-12 DIAGNOSIS — E1122 Type 2 diabetes mellitus with diabetic chronic kidney disease: Secondary | ICD-10-CM

## 2016-10-12 DIAGNOSIS — R1312 Dysphagia, oropharyngeal phase: Secondary | ICD-10-CM

## 2016-10-12 DIAGNOSIS — IMO0002 Reserved for concepts with insufficient information to code with codable children: Secondary | ICD-10-CM

## 2016-10-12 DIAGNOSIS — D638 Anemia in other chronic diseases classified elsewhere: Secondary | ICD-10-CM

## 2016-10-12 DIAGNOSIS — E118 Type 2 diabetes mellitus with unspecified complications: Secondary | ICD-10-CM

## 2016-10-12 DIAGNOSIS — J810 Acute pulmonary edema: Secondary | ICD-10-CM

## 2016-10-12 LAB — PROTIME-INR
INR: 3.65
Prothrombin Time: 37.2 seconds — ABNORMAL HIGH (ref 11.4–15.2)

## 2016-10-12 LAB — RENAL FUNCTION PANEL
ANION GAP: 13 (ref 5–15)
Albumin: 2.7 g/dL — ABNORMAL LOW (ref 3.5–5.0)
BUN: 32 mg/dL — ABNORMAL HIGH (ref 6–20)
CHLORIDE: 94 mmol/L — AB (ref 101–111)
CO2: 23 mmol/L (ref 22–32)
Calcium: 8.5 mg/dL — ABNORMAL LOW (ref 8.9–10.3)
Creatinine, Ser: 5.04 mg/dL — ABNORMAL HIGH (ref 0.44–1.00)
GFR calc non Af Amer: 9 mL/min — ABNORMAL LOW (ref >60)
GFR, EST AFRICAN AMERICAN: 10 mL/min — AB (ref >60)
Glucose, Bld: 146 mg/dL — ABNORMAL HIGH (ref 65–99)
POTASSIUM: 4.2 mmol/L (ref 3.5–5.1)
Phosphorus: 7.5 mg/dL — ABNORMAL HIGH (ref 2.5–4.6)
Sodium: 130 mmol/L — ABNORMAL LOW (ref 135–145)

## 2016-10-12 LAB — GLUCOSE, CAPILLARY
GLUCOSE-CAPILLARY: 162 mg/dL — AB (ref 65–99)
GLUCOSE-CAPILLARY: 95 mg/dL (ref 65–99)
GLUCOSE-CAPILLARY: 125 mg/dL — AB (ref 65–99)
Glucose-Capillary: 136 mg/dL — ABNORMAL HIGH (ref 65–99)

## 2016-10-12 LAB — CBC WITH DIFFERENTIAL/PLATELET
Basophils Absolute: 0 10*3/uL (ref 0.0–0.1)
Basophils Relative: 0 %
Eosinophils Absolute: 0.3 10*3/uL (ref 0.0–0.7)
Eosinophils Relative: 2 %
HEMATOCRIT: 33.2 % — AB (ref 36.0–46.0)
HEMOGLOBIN: 10.5 g/dL — AB (ref 12.0–15.0)
LYMPHS ABS: 1.7 10*3/uL (ref 0.7–4.0)
LYMPHS PCT: 11 %
MCH: 27.4 pg (ref 26.0–34.0)
MCHC: 31.6 g/dL (ref 30.0–36.0)
MCV: 86.7 fL (ref 78.0–100.0)
MONOS PCT: 13 %
Monocytes Absolute: 1.9 10*3/uL — ABNORMAL HIGH (ref 0.1–1.0)
NEUTROS ABS: 11.1 10*3/uL — AB (ref 1.7–7.7)
NEUTROS PCT: 74 %
Platelets: 481 10*3/uL — ABNORMAL HIGH (ref 150–400)
RBC: 3.83 MIL/uL — AB (ref 3.87–5.11)
RDW: 16.2 % — ABNORMAL HIGH (ref 11.5–15.5)
WBC: 15 10*3/uL — ABNORMAL HIGH (ref 4.0–10.5)

## 2016-10-12 MED ORDER — PRO-STAT SUGAR FREE PO LIQD
30.0000 mL | Freq: Three times a day (TID) | ORAL | Status: DC
  Administered 2016-10-12 – 2016-11-01 (×38): 30 mL via ORAL
  Filled 2016-10-12 (×42): qty 30

## 2016-10-12 MED ORDER — BOOST / RESOURCE BREEZE PO LIQD
1.0000 | Freq: Two times a day (BID) | ORAL | Status: DC
  Administered 2016-10-13: 1 via ORAL

## 2016-10-12 MED ORDER — METOCLOPRAMIDE HCL 10 MG PO TABS
10.0000 mg | ORAL_TABLET | Freq: Three times a day (TID) | ORAL | Status: DC
  Administered 2016-10-12 – 2016-10-16 (×11): 10 mg via ORAL
  Filled 2016-10-12 (×11): qty 1

## 2016-10-12 NOTE — Progress Notes (Signed)
ANTICOAGULATION CONSULT NOTE - Follow Up Consult  Pharmacy Consult for coumadin Indication: a. flutter  No Known Allergies  Patient Measurements: Height: 5\' 1"  (154.9 cm) Weight: 130 lb 1.1 oz (59 kg) IBW/kg (Calculated) : 47.8 Heparin Dosing Weight:   Vital Signs: Temp: 98.9 F (37.2 C) (11/09 0543) Temp Source: Oral (11/09 0543) BP: 114/68 (11/09 0543) Pulse Rate: 88 (11/09 0543)  Labs:  Recent Labs  10/10/16 0356 10/11/16 0348 10/11/16 1720 10/12/16 0628  HGB 11.1* 10.3*  --  10.5*  HCT 34.6* 33.5*  --  33.2*  PLT 529* 475*  --  481*  LABPROT 29.9* 34.8*  --   --   INR 2.78 3.35  --   --   CREATININE 5.88* 3.44* 4.20* 5.04*    Estimated Creatinine Clearance: 9.9 mL/min (by C-G formula based on SCr of 5.04 mg/dL (H)).   Medications:  Scheduled:  . amiodarone  200 mg Oral BID  . atorvastatin  20 mg Oral q1800  . benzonatate  100 mg Oral TID  . chlorhexidine  15 mL Mouth Rinse BID  . clopidogrel  75 mg Oral Daily  . [START ON 10/17/2016] darbepoetin (ARANESP) injection - DIALYSIS  100 mcg Intravenous Q Tue-HD  . famotidine  20 mg Oral Daily  . feeding supplement (NEPRO CARB STEADY)  1,000 mL Oral Daily  . feeding supplement (NEPRO CARB STEADY)  237 mL Oral Daily  . ferric gluconate (FERRLECIT/NULECIT) IV  250 mg Intravenous Q T,Th,Sa-HD  . Gerhardt's butt cream   Topical BID  . glycopyrrolate  1 mg Per Tube BID  . guaiFENesin  30 mL Per Tube Q6H  . hydrocortisone   Rectal BID  . insulin aspart  0-5 Units Subcutaneous QHS  . insulin aspart  0-9 Units Subcutaneous TID WC  . insulin detemir  5 Units Subcutaneous BID  . lanthanum  1,000 mg Oral TID WC  . metoCLOPramide  10 mg Oral TID AC  . midodrine  10 mg Oral q morning - 10a  . multivitamin  1 tablet Oral QHS  . multivitamin  15 mL Per Tube Daily  . phosphorus  500 mg Oral BID  . sodium chloride flush  10-40 mL Intracatheter Q12H  . Warfarin - Pharmacist Dosing Inpatient   Does not apply q1800    Infusions:    Assessment: 60 yo female with a flutter is currently on suprtherapeutic coumadin.  INR today is 3.65.  Goal of Therapy:  INR 2 - 2.5 per MD Monitor platelets by anticoagulation protocol: Yes   Plan:  - No coumadin tonight - INR in am  Prescious Hurless, Tsz-Yin 10/12/2016,8:14 AM

## 2016-10-12 NOTE — Evaluation (Signed)
Speech Language Pathology Assessment and Plan  Patient Details  Name: Deborah [name] MRN: [id number] Date of Birth: [birth year]  SLP Diagnosis: Cognitive Impairments;Voice disorder  Rehab Potential: Good ELOS: 14-17 days     Today's Date: 10/12/2016 SLP Individual Time: 8127-5170 SLP Individual Time Calculation (min): 55 min    Problem List:  Patient Active Problem List   Diagnosis Date Noted  . Oropharyngeal dysphagia   . Uncontrolled type 2 diabetes mellitus with complication, with long-term current use of insulin ([place])   . Physical debility 10/11/2016  . Metabolic encephalopathy 01/74/9449  . Debility   . ESRD on dialysis ([place])   . Type 2 diabetes mellitus with complication, with long-term current use of insulin ([place])   . Anemia of chronic disease   . Coronary artery disease involving native coronary artery of native heart without angina pectoris   . Acute on chronic respiratory failure with hypoxia ([place])   . Atrial flutter ([place])   . Persistent cough   . Dysphonia   . Sleep disturbance   . Diabetes mellitus type 2 in nonobese ([name])   . Stage 3 chronic kidney disease   . NSTEMI (non-ST elevated myocardial infarction) ([place])   . Tachypnea   . Hyponatremia   . Lymphocytosis   . Acute blood loss anemia   . Pressure injury of skin 09/18/2016  . Tracheostomy status ([place])   . Delirium   . Encounter for attention to tracheostomy ([place])   . History of MVR with cardiopulmonary bypass   . Systolic congestive heart failure ([place])   . HCAP (healthcare-associated pneumonia)   . PAF (paroxysmal atrial fibrillation) ([place])   . Acute systolic congestive heart failure ([place])   . AKI (acute kidney injury) ([place])   . Abnormal LFTs   . S/P MVR (mitral valve replacement)   . Mitral regurgitation 08/23/2016  . Acute on chronic diastolic heart failure ([place]) 08/18/2016  . Acute pulmonary edema ([name])   . Respiratory failure ([place]) 08/17/2016  . Cardiogenic shock ([place]) 08/17/2016  . Acute  hypoxemic respiratory failure ([place])   . STEMI (ST elevation myocardial infarction) ([place]) 08/15/2016  . Noncompliance 01/25/2015  . Bereavement 07/09/2014  . Low back pain 02/17/2014  . Cervical cancer screening 11/20/2013  . Routine general medical examination at a health care facility 10/14/2013  . Chronic kidney disease, stage 3 06/14/2012  . Hypertension 03/08/2012  . Diabetes mellitus type 2, controlled 03/08/2012  . Hyperlipidemia 03/08/2012   Past Medical History:  Past Medical History:  Diagnosis Date  . Diabetes mellitus   . Hyperlipidemia   . Hypertension    Past Surgical History:  Past Surgical History:  Procedure Laterality Date  . AV FISTULA PLACEMENT Left 10/02/2016   Procedure: INSERTION OF ARTERIOVENOUS (AV) GORE-TEX GRAFT ARM;  Surgeon: Waynetta Sandy, MD;  Location: [facility];  Service: Vascular;  Laterality: Left;  . CARDIAC CATHETERIZATION N/A 08/15/2016   Procedure: Left Heart Cath and Coronary Angiography;  Surgeon: Yolonda Kida, MD;  Location: [facility] [facility];  Service: Cardiovascular;  Laterality: N/A;  . CARDIAC CATHETERIZATION N/A 08/15/2016   Procedure: Coronary Stent Intervention;  Surgeon: Yolonda Kida, MD;  Location: [place] [facility];  Service: Cardiovascular;  Laterality: N/A;  . CARDIAC CATHETERIZATION N/A 08/18/2016   Procedure: Right Heart Cath;  Surgeon: Jolaine Artist, MD;  Location: [facility];  Service: Cardiovascular;  Laterality: N/A;  . CARDIAC CATHETERIZATION N/A 08/18/2016   Procedure: IABP Insertion;  Surgeon: Shaune Pascal Bensimhon,  MD;  Location: [facility];  Service: Cardiovascular;  Laterality: N/A;  . CARDIAC CATHETERIZATION Right 08/23/2016   Procedure: CENTRAL LINE INSERTION RIGHT SUBCLAVIAN;  Surgeon: Ivin Poot, MD;  Location: [facility];  Service: Open Heart Surgery;  Laterality: Right;  . ENDOVEIN HARVEST OF GREATER SAPHENOUS VEIN Left 08/23/2016   Procedure: ENDOVEIN HARVEST OF GREATER  SAPHENOUS VEIN;  Surgeon: Ivin Poot, MD;  Location: [facility];  Service: Open Heart Surgery;  Laterality: Left;  . INSERTION OF DIALYSIS CATHETER Bilateral 08/31/2016   Procedure: INSERTION OF DIALYSIS CATHETER LEFT INTERNAL JUGULAR VEIN & INSERTION OF TRIPLE LUMEN RIGHT INTERNAL JUGULAR VEIN;  Surgeon: Angelia Mould, MD;  Location: [facility];  Service: Vascular;  Laterality: Bilateral;  . INTRAOPERATIVE TRANSESOPHAGEAL ECHOCARDIOGRAM N/A 08/23/2016   Procedure: INTRAOPERATIVE TRANSESOPHAGEAL ECHOCARDIOGRAM;  Surgeon: Ivin Poot, MD;  Location: [facility];  Service: Open Heart Surgery;  Laterality: N/A;  . MITRAL VALVE REPAIR N/A 08/23/2016   Procedure: MITRAL VALVE REPAIR (MVR) USING 25MM EDWARDS MAGNA EASE BIOPROSTHESIS MITRAL  VALVE;  Surgeon: Ivin Poot, MD;  Location: [place];  Service: Open Heart Surgery;  Laterality: N/A;  . VAGINAL DELIVERY     x 6    Assessment / Plan / Recommendation Clinical Impression Deborah [name] is a 60 y.o. female with history of T2DM, HTN, CKD stage III with medical non-compliance who was originally admitted to [facility] 08/15/16 with NSTEMI and underwent cardiac cath with stenting. She went on to develop cardiogenic shock requiring intubation and was transferred to [facility] on 08/18/16 for treatment. She was extubated on 9/29 but reintubated due to septic shock.  She underwent tracheostomy on 10/5 and was weaned to ATC and tolerating PMSV for short periods of time. Has completed treatment for enterobacter HCAP and was decannualted on 10/25.  MBS done 10/23 showing moderate pharyngeal dysphagia--she was started on dysphagia 2, nectar liquids with chin tuck to prevent penetration. Follow up MBS done  diet advanced to dysphagia 3, thin.  Pt was admitted to CIR on 10/11/2016.  SLP evaluation was completed on 10/12/2016 with the following results: Pt presents with significant dysphonia and decreased respiratory support for speechimpacting intelligibility in sentences and  conversations which SLP suspects to be due to multiple intubations with subsequent debility.  Attempted BSE today but pt declined POs due to nausea and vomiting prior to SLP arrival.  Will re-attempt BSE at next available appointment.   Pt also demonstrates moderate cognitive impairment characterized by decreased working memory, decreased delayed recall of information, decreased selective attention to tasks, and decreased functional problem solving.   Given the abovementioned deficits, pt would benefit from skilled ST while inpatient in order to maximize functional independence and reduce burden of care prior to discharge.  Anticipate that pt will need 24/7 supervision and ST follow up at next level of care.    Skilled Therapeutic Interventions          Cognitive-linguistic evaluation completed with results and recommendations reviewed with patient.   Pt scored 21 out of 30 on the MoCA which is indicative of moderate cognitive impairment as mentioned above.  While pt was unable to complete bedside swallow evaluation due to nausea, pt was able to recall swallowing precautions with min assist verbal cues.  Discussed rationale for ST interventions while inpatient and all questions were answered to pt's satisfaction at this time.      SLP Assessment  Patient will need skilled Speech Lanaguage Pathology Services during CIR admission  Recommendations  Patient destination: Home Follow up Recommendations: Home Health SLP;Outpatient SLP;24 hour supervision/assistance Equipment Recommended: None recommended by SLP    SLP Frequency 3 to 5 out of 7 days   SLP Duration  SLP Intensity  SLP Treatment/Interventions 14-17 days   Minumum of 1-2 x/day, 30 to 90 minutes  Cognitive remediation/compensation;Cueing hierarchy;Environmental controls;Functional tasks;Internal/external aids;Patient/family education    Pain Pain Assessment Pain Assessment: No/denies pain  Prior Functioning Cognitive/Linguistic  Baseline: Within functional limits Type of Home: Apartment  Lives With: Family Available Help at Discharge: Family;Available PRN/intermittently Vocation: On disability  Function:  Eating Eating                 Cognition Comprehension Comprehension assist level: Follows basic conversation/direction with extra time/assistive device  Expression   Expression assist level: Expresses basic 75 - 89% of the time/requires cueing 10 - 24% of the time. Needs helper to occlude trach/needs to repeat words.  Social Interaction Social Interaction assist level: Interacts appropriately 75 - 89% of the time - Needs redirection for appropriate language or to initiate interaction.  Problem Solving Problem solving assist level: Solves basic 75 - 89% of the time/requires cueing 10 - 24% of the time  Memory Memory assist level: Recognizes or recalls 75 - 89% of the time/requires cueing 10 - 24% of the time   Short Term Goals: Week 1: SLP Short Term Goal 1 (Week 1): Pt will selectively attend to tasks in a mildly distracting environment for 5-7 minutes with supervision verbal cues for redirection SLP Short Term Goal 2 (Week 1): Pt will complete basic familiar tasks with supervision verbal cues for functional problem solving.  SLP Short Term Goal 3 (Week 1): Pt will recall new information with supervision verbal cues for use of external aids.  SLP Short Term Goal 4 (Week 1): Pt will return demonstration of vocal hygiene techniques with min assist verbal cues.    Refer to Care Plan for Long Term Goals  Recommendations for other services: None  Discharge Criteria: Patient will be discharged from SLP if patient refuses treatment 3 consecutive times without medical reason, if treatment goals not met, if there is a change in medical status, if patient makes no progress towards goals or if patient is discharged from hospital.  The above assessment, treatment plan, treatment alternatives and goals were  discussed and mutually agreed upon: by patient  Emilio Math 10/12/2016, 7:44 PM

## 2016-10-12 NOTE — Progress Notes (Signed)
Patient ID: Deborah [name], female   DOB: [birth year], 60 y.o.   MRN: [id number]  [facility] Progress Note   Subjective:    56 kg post HD 11/7 well tolerated For HD today Still with cough Transferred to Rehab "Do you think I'll be home by Thanksgiving?"    Objective:   BP 114/68 (BP Location: Right Arm)   Pulse 88   Temp 98.9 F (37.2 C) (Oral)   Resp 20   Ht 5\' 1"  (1.549 m)   Wt 59 kg (130 lb 1.1 oz)   SpO2 95%   BMI 24.58 kg/m   Physical Exam: VS as noted NAD Prev trach site closed up Chest is clear anteriorly but has cough with deep breathing  S1S2 No S3 Healed sternotomy scar Abd soft and not tender No edema of LE's Left upper arm AVG with palpable thrill and good quality bruit (10/30) Left sided TDC (9/28 VVS)   Recent Labs Lab 10/05/16 1938 10/07/16 0904 10/08/16 0123 10/09/16 0256 10/10/16 0356 10/11/16 0348 10/11/16 1720 10/12/16 0628  NA 135 128* 134* 134* 133* 133* 129* 130*  K 3.8 4.0 3.5 4.1 4.0 3.4* 4.6 4.2  CL 97* 90* 94* 94* 91* 94* 93* 94*  CO2 26 23 25 24 24 26 23 23   GLUCOSE 129* 233* 238* 150* 132* 53* 195* 146*  BUN 24* 96* 34* 60* 82* 22* 27* 32*  CREATININE 1.97* 4.24* 2.66* 4.59* 5.88* 3.44* 4.20* 5.04*  CALCIUM 8.7* 8.9 9.1 9.1 9.1 8.5* 8.4* 8.5*  PHOS 1.2* 2.3*  --   --  7.2* 5.3*  --  7.5*   Iron/TIBC/Ferritin/ %Sat    Component Value Date/Time   IRON 22 (L) 10/10/2016 0356   TIBC 326 10/10/2016 0356   FERRITIN 272 10/10/2016 0356   IRONPCTSAT 7 (L) 10/10/2016 0356   Results for Deborah [name], Deborah [name] (MRN [id number]) as of 10/12/2016 08:46  Ref. Range 10/10/2016 03:56  PTH Latest Ref Range: 15 - 65 pg/mL 199 (H)    Recent Labs Lab 10/07/16 0904 10/10/16 0356 10/11/16 0348 10/12/16 0628  WBC 17.8* 18.7* 15.1* 15.0*  NEUTROABS  --   --   --  11.1*  HGB 10.6* 11.1* 10.3* 10.5*  HCT 34.3* 34.6* 33.5* 33.2*  MCV 89.6 86.7 87.9 86.7  PLT 390 529* 475* 481*    Lab Results  Component Value Date   INR 3.35  10/11/2016   INR 2.78 10/10/2016   INR 2.10 10/09/2016    Medications:    . amiodarone  200 mg Oral BID  . atorvastatin  20 mg Oral q1800  . benzonatate  100 mg Oral TID  . chlorhexidine  15 mL Mouth Rinse BID  . clopidogrel  75 mg Oral Daily  . [START ON 10/17/2016] darbepoetin (ARANESP) injection - DIALYSIS  100 mcg Intravenous Q Tue-HD  . famotidine  20 mg Oral Daily  . feeding supplement (NEPRO CARB STEADY)  1,000 mL Oral Daily  . feeding supplement (NEPRO CARB STEADY)  237 mL Oral Daily  . ferric gluconate (FERRLECIT/NULECIT) IV  250 mg Intravenous Q T,Th,Sa-HD  . Gerhardt's butt cream   Topical BID  . glycopyrrolate  1 mg Per Tube BID  . guaiFENesin  30 mL Per Tube Q6H  . hydrocortisone   Rectal BID  . insulin aspart  0-5 Units Subcutaneous QHS  . insulin aspart  0-9 Units Subcutaneous TID WC  . insulin detemir  5 Units Subcutaneous BID  . lanthanum  1,000 mg Oral  TID WC  . metoCLOPramide  10 mg Oral TID AC  . midodrine  10 mg Oral q morning - 10a  . multivitamin  1 tablet Oral QHS  . multivitamin  15 mL Per Tube Daily  . phosphorus  500 mg Oral BID  . sodium chloride flush  10-40 mL Intracatheter Q12H  . Warfarin - Pharmacist Dosing Inpatient   Does not apply q1800   Background: 60 y/o AAF with DM2, HTN, HLD presented to [facility] ER with infposterior STEMI on 9/12->cath->. PCS with stent x 2 by Dr. Clayborn Bigness. EF 55-60% with severe MR.  Post-cath respiratory failure ->intubation. Shock and renal failure (creatinine 1.6 on adm). Trialysis catheter placed 9/14 and pt had [facility] at [facility] for [facility] hours prior to transfer here on 08/18/16->back to cath lab -> ruptured posterior papillary muscle with severe MR->IABP/[place]. We were asked to see d/t progressive rise in creatinine/volume overload/anuric AKI .  CRRT re-initiated 08/21/16-9/20, then 9/21 post MVR. Transitioned to IHD.  Dialysis dependent since 08/17/16. No recovery. New ESRD.  Assessment/ Plan:    1. CAD status post STEMI/ruptured  posterior papillary muscle s/p mitral valve replacement on 08/23/16. Amio. Coumadin. Midodrine for BP.  2. 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  TTS schedule via Left TDC (9/28 [place] Dock). S/P LUA AVG placed on 10/30 (brachioaxillary) by Dr. Donzetta Matters - maturation in 4 weeks. EDW looks to be about 56-57 kg. (outpt has spot [facility] TTS). For HD today. 3. Hypokalemia - Gave 30 KDur 11/8 for K 3.4->K 4.2.  4. Anemia: Secondary to anemia of ESRD/Fe def - on darbe 100 QTuesday. TSat 7%. Fe load with HD. Hb today 10.5. 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 Added non ca based binder -  Fosrenol. May need higher dose - will follow phos.  PTH 199 no intervention needed. 2Ca bath with HD 6. Nutrition: Low albumin secondary to critical illness/prolonged hospitalization, (Nepro/continue renal vitamin) 7. Cough/intermittent dyspnea: Last CXR 11/5 clear, no edema. I presume aspiration is the concern. On Dysphagia diet. 8. Disposition - Now getting inpt rehab  Jamal Maes, MD [facility] [phone] Pager 10/12/2016, 8:41 AM

## 2016-10-12 NOTE — Evaluation (Signed)
Occupational Therapy Assessment and Plan  Patient Details  Name: Deborah [name] MRN: [id number] Date of Birth: [birth year]  OT Diagnosis: cognitive deficits, muscle weakness (generalized) and endurance deficits Rehab Potential: Rehab Potential (ACUTE ONLY): Excellent ELOS: 14-17 days   Today's Date: 10/12/2016 OT Individual Time: 1045-1200 OT Individual Time Calculation (min): 75 min      Problem List:  Patient Active Problem List   Diagnosis Date Noted  . Oropharyngeal dysphagia   . Uncontrolled type 2 diabetes mellitus with complication, with long-term current use of insulin ([place])   . Physical debility 10/11/2016  . Metabolic encephalopathy 08/65/7846  . Debility   . ESRD on dialysis ([place])   . Type 2 diabetes mellitus with complication, with long-term current use of insulin ([place])   . Anemia of chronic disease   . Coronary artery disease involving native coronary artery of native heart without angina pectoris   . Acute on chronic respiratory failure with hypoxia ([place])   . Atrial flutter ([place])   . Persistent cough   . Dysphonia   . Sleep disturbance   . Diabetes mellitus type 2 in nonobese ([name])   . Stage 3 chronic kidney disease   . NSTEMI (non-ST elevated myocardial infarction) ([place])   . Tachypnea   . Hyponatremia   . Lymphocytosis   . Acute blood loss anemia   . Pressure injury of skin 09/18/2016  . Tracheostomy status ([place])   . Delirium   . Encounter for attention to tracheostomy ([place])   . History of MVR with cardiopulmonary bypass   . Systolic congestive heart failure ([place])   . HCAP (healthcare-associated pneumonia)   . PAF (paroxysmal atrial fibrillation) ([place])   . Acute systolic congestive heart failure ([place])   . AKI (acute kidney injury) ([place])   . Abnormal LFTs   . S/P MVR (mitral valve replacement)   . Mitral regurgitation 08/23/2016  . Acute on chronic diastolic heart failure ([place]) 08/18/2016  . Acute pulmonary edema ([name])   . Respiratory failure ([place])  08/17/2016  . Cardiogenic shock ([place]) 08/17/2016  . Acute hypoxemic respiratory failure ([place])   . STEMI (ST elevation myocardial infarction) ([place]) 08/15/2016  . Noncompliance 01/25/2015  . Bereavement 07/09/2014  . Low back pain 02/17/2014  . Cervical cancer screening 11/20/2013  . Routine general medical examination at a health care facility 10/14/2013  . Chronic kidney disease, stage 3 06/14/2012  . Hypertension 03/08/2012  . Diabetes mellitus type 2, controlled 03/08/2012  . Hyperlipidemia 03/08/2012    Past Medical History:  Past Medical History:  Diagnosis Date  . Diabetes mellitus   . Hyperlipidemia   . Hypertension    Past Surgical History:  Past Surgical History:  Procedure Laterality Date  . AV FISTULA PLACEMENT Left 10/02/2016   Procedure: INSERTION OF ARTERIOVENOUS (AV) GORE-TEX GRAFT ARM;  Surgeon: Waynetta Sandy, MD;  Location: [facility];  Service: Vascular;  Laterality: Left;  . CARDIAC CATHETERIZATION N/A 08/15/2016   Procedure: Left Heart Cath and Coronary Angiography;  Surgeon: Yolonda Kida, MD;  Location: [facility];  Service: Cardiovascular;  Laterality: N/A;  . CARDIAC CATHETERIZATION N/A 08/15/2016   Procedure: Coronary Stent Intervention;  Surgeon: Yolonda Kida, MD;  Location: [facility];  Service: Cardiovascular;  Laterality: N/A;  . CARDIAC CATHETERIZATION N/A 08/18/2016   Procedure: Right Heart Cath;  Surgeon: Jolaine Artist, MD;  Location: [facility];  Service: Cardiovascular;  Laterality: N/A;  . CARDIAC CATHETERIZATION N/A 08/18/2016  Procedure: IABP Insertion;  Surgeon: Jolaine Artist, MD;  Location: [facility];  Service: Cardiovascular;  Laterality: N/A;  . CARDIAC CATHETERIZATION Right 08/23/2016   Procedure: CENTRAL LINE INSERTION RIGHT SUBCLAVIAN;  Surgeon: Ivin Poot, MD;  Location: [facility];  Service: Open Heart Surgery;  Laterality: Right;  . ENDOVEIN HARVEST OF GREATER SAPHENOUS VEIN  Left 08/23/2016   Procedure: ENDOVEIN HARVEST OF GREATER SAPHENOUS VEIN;  Surgeon: Ivin Poot, MD;  Location: [facility];  Service: Open Heart Surgery;  Laterality: Left;  . INSERTION OF DIALYSIS CATHETER Bilateral 08/31/2016   Procedure: INSERTION OF DIALYSIS CATHETER LEFT INTERNAL JUGULAR VEIN & INSERTION OF TRIPLE LUMEN RIGHT INTERNAL JUGULAR VEIN;  Surgeon: Angelia Mould, MD;  Location: [facility];  Service: Vascular;  Laterality: Bilateral;  . INTRAOPERATIVE TRANSESOPHAGEAL ECHOCARDIOGRAM N/A 08/23/2016   Procedure: INTRAOPERATIVE TRANSESOPHAGEAL ECHOCARDIOGRAM;  Surgeon: Ivin Poot, MD;  Location: [facility];  Service: Open Heart Surgery;  Laterality: N/A;  . MITRAL VALVE REPAIR N/A 08/23/2016   Procedure: MITRAL VALVE REPAIR (MVR) USING 25MM EDWARDS MAGNA EASE BIOPROSTHESIS MITRAL  VALVE;  Surgeon: Ivin Poot, MD;  Location: [facility];  Service: Open Heart Surgery;  Laterality: N/A;  . VAGINAL DELIVERY     x 6    Assessment & Plan Clinical Impression: Patient is a 60 y.o. year old female with recent admission to [facility] 08/15/16 with NSTEMI and underwent cardiac cath with stenting. History taken from chart review. She developed acute renal failure with metabolic acidosis and oliguria requiring CRRT. She went on to develop cardiogenic shock requiring intubation and was transferred to [facility] on 08/18/16 for treatment. 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 She underwent mitral valve repair by Prescott Gum on 9/21.  Right IJ placed on 9/28 and HD initiated.  She was extubated on 9/29 but reintubated due to septic shock due to staph bacteremia and required pressors for support.  A flutter treated with TEE DCCV on 10/2. On  Coumadin and amiodarone for sinus tach with PAF.  She underwent tracheostomy on 10/5 and was weaned to ATC and tolerating PMSV for short periods of time. Has completed treatment for enterobacter HCAP and was decannualted on 10/25.  Patient transferred to CIR on 10/11/2016 .    Patient currently requires max with basic self-care skills secondary to muscle weakness, decreased cardiorespiratoy endurance, decreased initiation, decreased attention, decreased awareness, decreased problem solving, decreased safety awareness and delayed processing and decreased sitting balance, decreased standing balance and decreased postural control.  Prior to hospitalization, patient could complete ADL and IADL with independent .  Patient will benefit from skilled intervention to increase independence with basic self-care skills prior to discharge home with care partner.  Anticipate patient will require intermittent supervision and follow up home health.  OT - End of Session Activity Tolerance: Tolerates < 10 min activity, no significant change in vital signs Endurance Deficit: Yes Endurance Deficit Description: patient required multiple seated rest breaks with mobility due to shortness of breath OT Assessment Rehab Potential (ACUTE ONLY): Excellent OT Patient demonstrates impairments in the following area(s): Balance;Cognition;Endurance;Motor;Safety OT Basic ADL's Functional Problem(s): Grooming;Bathing;Dressing;Toileting OT Advanced ADL's Functional Problem(s): Simple Meal Preparation OT Transfers Functional Problem(s): Toilet;Tub/Shower OT Plan OT Intensity: Minimum of 1-2 x/day, 45 to 90 minutes OT Frequency: 5 out of 7 days OT Duration/Estimated Length of Stay: 14-17 days OT Treatment/Interventions: Balance/vestibular training;Cognitive remediation/compensation;Community reintegration;Discharge planning;DME/adaptive equipment instruction;Functional mobility training;Patient/family education;Psychosocial support;Self Care/advanced ADL retraining;Therapeutic Activities;Therapeutic  Exercise;UE/LE Strength taining/ROM;UE/LE Coordination activities OT Basic Self-Care Anticipated Outcome(s): Mod I OT Toileting Anticipated Outcome(s): Mod I OT  Bathroom Transfers Anticipated Outcome(s): Mod I OT Recommendation Patient destination: Home Follow Up Recommendations: Home health OT Equipment Recommended: Tub/shower bench   Skilled Therapeutic Intervention Initial eval completed with treatment provided to address functional transfers, improved sit<>stand, standing tolerance, and adapted bathing/dressing skills. Pt completed stand-pivot transfers with overall Mod A. Pt with delayed processing throughout session. Bathing/dressing completed at sink level with Max questioning cues to initiate and sequence activity. Pt required multiple seated rest breaks during bathing/dressing 2/2 low endurance. Pt then ambulated short distance in hallway w/ RW and overall min A w/ intermittent Mod A for new LOB. Pt returned to bed at end of session and left with bed alarm on and needs met.    OT Evaluation Precautions/Restrictions  Precautions Precautions: Fall  Pain Pain Assessment Pain Assessment: No/denies pain Home Living/Prior Functioning Home Living Living Arrangements: Children Available Help at Discharge: Family, Available PRN/intermittently Type of Home: Apartment Home Access: Stairs to enter Technical brewer of Steps: 6 Entrance Stairs-Rails: Left Home Layout: One level Bathroom Shower/Tub: Government social research officer Accessibility: Yes Additional Comments: per pt chart, daughter to work from home but currently working night shift  Lives With: Family IADL History IADL Comments: Pt does not drive, her daughter would take her to the store to do grocery shopping or run errands Prior Function Level of Independence: Independent with basic ADLs, Independent with homemaking with ambulation  Able to Take Stairs?: Yes Driving: No Vocation: Other (comment) (disabled per pt report) Leisure: Hobbies-yes (Comment) Comments: go outside, read, do puzzle books ADL ADL ADL Comments: Please see functional  navigator Vision/Perception  Vision- History Baseline Vision/History: Wears glasses Wears Glasses: Reading only  Cognition Overall Cognitive Status: Impaired/Different from baseline Arousal/Alertness: Awake/alert Orientation Level: Situation;Place;Person Person: Oriented Place: Oriented Situation: Oriented Year: 2017 Month: November Day of Week: Correct Memory: Appears intact Immediate Memory Recall: Sock;Blue;Bed Memory Recall: Sock;Blue;Bed Memory Recall Sock: With Cue Memory Recall Blue: Without Cue Memory Recall Bed: Without Cue Awareness: Impaired Problem Solving: Impaired Initiating: Impaired Safety/Judgment: Impaired Sensation Sensation Light Touch: Impaired by gross assessment Additional Comments: intermittent tingling in B feet  Coordination Gross Motor Movements are Fluid and Coordinated: No Fine Motor Movements are Fluid and Coordinated: No Motor  Motor Motor: Within Functional Limits Motor - Skilled Clinical Observations: deconditioned and generalized weakness Balance Balance Balance Assessed: Yes Static Standing Balance Static Standing - Balance Support: Right upper extremity supported;During functional activity Static Standing - Level of Assistance: 4: Min assist Dynamic Standing Balance Dynamic Standing - Balance Support: During functional activity Dynamic Standing - Level of Assistance: 3: Min/Mod assist- Intermittent Mod A for posterior LOB Dynamic Standing - Balance Activities: Reaching for weighted objects Extremity/Trunk Assessment RUE Assessment RUE Assessment: Within Functional Limits (4-/5) LUE Assessment LUE Assessment: Within Functional Limits (4-/5)   See Function Navigator for Current Functional Status.   Refer to Care Plan for Long Term Goals  Recommendations for other services: None  Discharge Criteria: Patient will be discharged from OT if patient refuses treatment 3 consecutive times without medical reason, if treatment goals  not met, if there is a change in medical status, if patient makes no progress towards goals or if patient is discharged from hospital.  The above assessment, treatment plan, treatment alternatives and goals were discussed and mutually agreed upon: by patient  Valma Cava 10/12/2016, 12:55 PM

## 2016-10-12 NOTE — Progress Notes (Signed)
Patient information reviewed and entered into eRehab system by Haskell Rihn, RN, CRRN, PPS Coordinator.  Information including medical coding and functional independence measure will be reviewed and updated through discharge.    

## 2016-10-12 NOTE — Progress Notes (Signed)
[place] PHYSICAL MEDICINE & REHABILITATION     PROGRESS NOTE  Subjective/Complaints:  Pt seen sitting up in bed this AM.  She states she slept well overnight. Her appearance has improved.  She is ready to begin therapies.   ROS: +Cough. Denies CP, SOB, N/V/D.  Objective: Vital Signs: Blood pressure 114/68, pulse 88, temperature 98.9 F (37.2 C), temperature source Oral, resp. rate 20, height 5\' 1"  (1.549 m), weight 59 kg (130 lb 1.1 oz), SpO2 95 %. Dg Chest 2 View  Result Date: 10/11/2016 CLINICAL DATA:  Dyspnea and cough for 1 month. EXAM: CHEST  2 VIEW COMPARISON:  10/08/2016, 08/30/2016 FINDINGS: There is stable cardiomegaly. There is subpleural scarring in the left mid lung and left lung base. There may be a small right lower lobe infiltrate. No pneumothorax, effusion or CHF. Status post median sternotomy and mitral valvular repair. Dual-lumen left jugular central line catheter extends into the right atrium, unchanged in appearance. IMPRESSION: Stable cardiomegaly. Faint opacities in the right lower lobe medially which may reflect a small pneumonic consolidation. Atelectasis and/or scarring at the left lung base. Electronically Signed   By: Ashley Royalty M.D.   On: 10/11/2016 18:24   Dg Swallowing Func-speech Pathology  Result Date: 10/11/2016 Objective Swallowing Evaluation: Type of Study: MBS-Modified Barium Swallow Study Patient Details Name: STEPHENIA VOGAN MRN: [id number] Date of Birth: 1956/03/18 Today's Date: 10/11/2016 Time: SLP Start Time (ACUTE ONLY): 1055-SLP Stop Time (ACUTE ONLY): 1109 SLP Time Calculation (min) (ACUTE ONLY): 14 min Past Medical History: Past Medical History: Diagnosis Date . Diabetes mellitus  . Hyperlipidemia  . Hypertension  Past Surgical History: Past Surgical History: Procedure Laterality Date . AV FISTULA PLACEMENT Left 10/02/2016  Procedure: INSERTION OF ARTERIOVENOUS (AV) GORE-TEX GRAFT ARM;  Surgeon: Waynetta Sandy, MD;  Location: [place];  Service:  Vascular;  Laterality: Left; . CARDIAC CATHETERIZATION N/A 08/15/2016  Procedure: Left Heart Cath and Coronary Angiography;  Surgeon: Yolonda Kida, MD;  Location: [facility];  Service: Cardiovascular;  Laterality: N/A; . CARDIAC CATHETERIZATION N/A 08/15/2016  Procedure: Coronary Stent Intervention;  Surgeon: Yolonda Kida, MD;  Location: [place] [facility];  Service: Cardiovascular;  Laterality: N/A; . CARDIAC CATHETERIZATION N/A 08/18/2016  Procedure: Right Heart Cath;  Surgeon: Jolaine Artist, MD;  Location: [place] [facility];  Service: Cardiovascular;  Laterality: N/A; . CARDIAC CATHETERIZATION N/A 08/18/2016  Procedure: IABP Insertion;  Surgeon: Jolaine Artist, MD;  Location: [place];  Service: Cardiovascular;  Laterality: N/A; . CARDIAC CATHETERIZATION Right 08/23/2016  Procedure: CENTRAL LINE INSERTION RIGHT SUBCLAVIAN;  Surgeon: Ivin Poot, MD;  Location: [facility];  Service: Open Heart Surgery;  Laterality: Right; . ENDOVEIN HARVEST OF GREATER SAPHENOUS VEIN Left 08/23/2016  Procedure: ENDOVEIN HARVEST OF GREATER SAPHENOUS VEIN;  Surgeon: Ivin Poot, MD;  Location: [facility];  Service: Open Heart Surgery;  Laterality: Left; . INSERTION OF DIALYSIS CATHETER Bilateral 08/31/2016  Procedure: INSERTION OF DIALYSIS CATHETER LEFT INTERNAL JUGULAR VEIN & INSERTION OF TRIPLE LUMEN RIGHT INTERNAL JUGULAR VEIN;  Surgeon: Angelia Mould, MD;  Location: [place];  Service: Vascular;  Laterality: Bilateral; . INTRAOPERATIVE TRANSESOPHAGEAL ECHOCARDIOGRAM N/A 08/23/2016  Procedure: INTRAOPERATIVE TRANSESOPHAGEAL ECHOCARDIOGRAM;  Surgeon: Ivin Poot, MD;  Location: [place];  Service: Open Heart Surgery;  Laterality: N/A; . MITRAL VALVE REPAIR N/A 08/23/2016  Procedure: MITRAL VALVE REPAIR (MVR) USING 25MM EDWARDS MAGNA EASE BIOPROSTHESIS MITRAL  VALVE;  Surgeon: Ivin Poot, MD;  Location: [place];  Service: Open Heart Surgery;  Laterality: N/A; . VAGINAL DELIVERY    x 6 HPI: 60  yo female admitted [facility]/16 with STEMI s/p cath to [place]. Developed cardiogenic shock, VDRF (ETT 9/14-9/29), AKI with metabolic acidosis and started on CRRT 08/17/16. 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 She also had fever and found to have Group B Strep in sputum culture.Mitral valve replacement 9/20. Swallow evaluation 9/30 with recs for NPO given poor mentation, severely impaired swallow function s/p prolonged intubation. Reintubated 9/30 secondary to septic shock; trach 10/5; new onset right hemiplegia and right gaze preference.  CT negative for acute changes; dx acute encephalopathy. MBS on 10/23 and Dys 2 with nectar thick liquids using a chin tuck began. 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 She had a baseline, congested cough throughout MBS that was not associated with aspiration. Mild piecemeal swallowing observed with pureed and regular solids. A delay in swallow initation to the valleculae with reduced laryngeal closure seen for all PO intake. Given thin liquid, pt had a single event of silent aspiration, but when utilizing a chin tuck intermittent flash penetration occurred with no penetrate/aspirate residuals. Following multiple thin liquid trials, pt's strong cough resulted in retrograde backflow of boluses from the esophagus to the level of the valleculae. Retrograde backflow intermittently occurred into the pharynx with more solid trials; however, her airway remained protected throughout these events. Esophageal sweep showed mild build up of consistencies (MD not present to confirm). Recommend advancement to  Dys 3 and thin liquids utilizing a chin tuck for every sip. MD may wish to consider further esophageal assessment given findings on most recent two MBS, which impacts pt's risk for post-prandial aspiration and may be influencing some of her coughing. Will continue to follow acutely for diet tolerance. Impact on safety and function Mild aspiration risk   CHL IP TREATMENT RECOMMENDATION 10/11/2016 Treatment Recommendations Therapy as outlined in treatment plan below   Prognosis 10/11/2016 Prognosis for Safe Diet Advancement Good Barriers to Reach Goals -- Barriers/Prognosis Comment -- CHL IP DIET RECOMMENDATION 10/11/2016 SLP Diet Recommendations Dysphagia 3 (Mech soft) solids;Thin liquid Liquid Administration via Cup;No straw Medication Administration Crushed with puree Compensations Minimize environmental distractions;Slow rate;Small sips/bites;Chin tuck;Follow solids with liquid Postural Changes Remain semi-upright after after feeds/meals (Comment);Seated upright at 90 degrees   CHL IP OTHER RECOMMENDATIONS 10/11/2016 Recommended Consults -- Oral Care Recommendations Oral care BID Other Recommendations --   CHL IP FOLLOW UP RECOMMENDATIONS 10/11/2016 Follow up Recommendations Inpatient Rehab   CHL IP FREQUENCY AND DURATION 10/11/2016 Speech Therapy Frequency (ACUTE ONLY) min 2x/week Treatment Duration 2 weeks      CHL IP ORAL PHASE 10/11/2016 Oral Phase Impaired Oral - Pudding Teaspoon -- Oral - Pudding Cup -- Oral - Honey Teaspoon -- Oral - Honey Cup -- Oral - Nectar Teaspoon -- Oral - Nectar Cup NT Oral - Nectar Straw NT Oral - Thin Teaspoon NT Oral - Thin Cup WFL Oral - Thin Straw -- Oral - Puree Piecemeal swallowing Oral - Mech Soft NT Oral - Regular Piecemeal swallowing Oral - Multi-Consistency NT Oral - Pill NT Oral Phase - Comment --  CHL IP PHARYNGEAL PHASE 10/11/2016 Pharyngeal Phase Impaired Pharyngeal- Pudding Teaspoon -- Pharyngeal -- Pharyngeal- Pudding Cup --  Pharyngeal -- Pharyngeal- Honey Teaspoon --  Pharyngeal -- Pharyngeal- Honey Cup -- Pharyngeal -- Pharyngeal- Nectar Teaspoon -- Pharyngeal -- Pharyngeal- Nectar Cup NT Pharyngeal -- Pharyngeal- Nectar Straw NT Pharyngeal -- Pharyngeal- Thin Teaspoon NT Pharyngeal -- Pharyngeal- Thin Cup Penetration/Aspiration before swallow;Reduced airway/laryngeal closure;Delayed swallow initiation-vallecula;Compensatory strategies attempted (with notebox) Pharyngeal Material enters airway, passes BELOW cords without attempt by patient to eject out (silent aspiration);Material enters airway, remains ABOVE vocal cords then ejected out Pharyngeal- Thin Straw -- Pharyngeal -- Pharyngeal- Puree Delayed swallow initiation-vallecula;Reduced airway/laryngeal closure Pharyngeal -- Pharyngeal- Mechanical Soft -- Pharyngeal -- Pharyngeal- Regular Delayed swallow initiation-vallecula;Reduced airway/laryngeal closure Pharyngeal -- Pharyngeal- Multi-consistency -- Pharyngeal -- Pharyngeal- Pill NT Pharyngeal -- Pharyngeal Comment --  CHL IP CERVICAL ESOPHAGEAL PHASE 10/11/2016 Cervical Esophageal Phase Impaired Pudding Teaspoon -- Pudding Cup -- Honey Teaspoon -- Honey Cup -- Nectar Teaspoon -- Nectar Cup -- Nectar Straw -- Thin Teaspoon -- Thin Cup Esophageal backflow into the pharynx Thin Straw -- Puree Esophageal backflow into the pharynx Mechanical Soft -- Regular Esophageal backflow into the pharynx Multi-consistency -- Pill -- Cervical Esophageal Comment -- No flowsheet data found. Germain Osgood 10/11/2016, 1:23 PM  Note populated for Jiles Prows, student SLP Germain Osgood, M.A. CCC-SLP [phone]              Recent Labs  10/11/16 0348 10/12/16 0628  WBC 15.1* 15.0*  HGB 10.3* 10.5*  HCT 33.5* 33.2*  PLT 475* 481*    Recent Labs  10/11/16 1720 10/12/16 0628  NA 129* 130*  K 4.6 4.2  CL 93* 94*  GLUCOSE 195* 146*  BUN 27* 32*  CREATININE 4.20* 5.04*  CALCIUM 8.4* 8.5*   CBG (last 3)   Recent Labs  10/11/16 1604 10/11/16 2032 10/12/16 0631   GLUCAP 192* 253* 136*    Wt Readings from Last 3 Encounters:  10/11/16 59 kg (130 lb 1.1 oz)  10/11/16 58.1 kg (128 lb 1.4 oz)  08/18/16 71.2 kg (156 lb 15.5 oz)    Physical Exam:  BP 114/68 (BP Location: Right Arm)   Pulse 88   Temp 98.9 F (37.2 C) (Oral)   Resp 20   Ht 5\' 1"  (1.549 m)   Wt 59 kg (130 lb 1.1 oz)   SpO2 95%   BMI 24.58 kg/m  Constitutional:  She appears well-developed and well-nourished. No distress.  HENT: Normocephalic and atraumatic.  Eyes: EOM are normal. No discharge.  Cardiovascular: Normal rate and regular rhythm.  No JVD. Respiratory: Effort normal. She has no wheezes. She exhibits tenderness.  GI: Soft. Bowel sounds are normal. She exhibits no distension.  Musculoskeletal: She exhibits tenderness (LUE-AVG). She exhibits no edema.  Neurological: She is alert and oriented.  Dysphonic Motor: 4/5 throughout Skin: Skin is warm and dry. She is not diaphoretic. No erythema.  Incision healing  Psychiatric: Her affect is blunt. She is slowed.    Assessment/Plan: 1. Functional deficits secondary to debility from multiply cardial and pulmonary issues which require 3+ hours per day of interdisciplinary therapy in a comprehensive inpatient rehab setting. Physiatrist is providing close team supervision and 24 hour management of active medical problems listed below. Physiatrist and rehab team continue to assess barriers to discharge/monitor patient progress toward functional and medical goals.  Function:  Bathing Bathing position      Bathing parts      Bathing assist        Upper Body Dressing/Undressing Upper body dressing  Upper body assist        Lower Body Dressing/Undressing Lower body dressing                                  Lower body assist        Toileting Toileting     Toileting steps completed by helper: Performs perineal hygiene, Adjust clothing prior to toileting, Adjust clothing after  toileting    Toileting assist     Transfers Chair/bed transfer Chair/bed transfer activity did not occur: Safety/medical concerns           Locomotion Ambulation           Wheelchair          Cognition Comprehension Comprehension assist level: Understands basic 75 - 89% of the time/ requires cueing 10 - 24% of the time  Expression Expression assist level: Expresses basic 75 - 89% of the time/requires cueing 10 - 24% of the time. Needs helper to occlude trach/needs to repeat words.  Social Interaction Social Interaction assist level: Interacts appropriately 75 - 89% of the time - Needs redirection for appropriate language or to initiate interaction.  Problem Solving    Memory      Medical Problem List and Plan: 1.  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  Begin CIR 2.  DVT Prophylaxis/Anticoagulation: Pharmaceutical: Coumadin  INR pending 11/9 3. Pain Management: Hydrocodone prn effective. 4. Mood: LCSW to follow for evaluation and support.  5. Neuropsych: This patient is not fully capable of making decisions on her own behalf. 6. Skin/Wound Care: Routine pressure relief measures.  7. Fluids/Electrolytes/Nutrition: Strict I/O. Renal panel TTSa with HD.   D3, thin diet, advance diet as tolerated 8. Leucocytosis:   Cont cipro for bronchitis   WBCs 15.0 on 11/9  Will cont to monitor 10 T2DM:Hgb A1c- 13.3. Currently on Levemir bid.   Will monitor blood sugars ac/hs with SSI for elevated BS  Labile at present, will monitor for trend 11. ESRD: Now HD dependent--to schedule HD in afternoons to help with tolerance of therapy during the day. Educate patient and family on renal diet. Electrolyte abnormalities being managed during HD.  12. Anemia of chronic disease:   Hb 10.6 on 11/9  Monitor for signs of bleeding.   On aranesp for erythropoiesis and nulecit for iron load 13. CAD s/p DES X 2 and  MVR: Monitor for symptoms with increase in activity. On plavix and Lipitor.  14. Acute respiratory failure due to acute pulmonary edema:  Fluid overload compensated with HD.   CXR 11/8 reviewed, relatively unremarkable ?RLL finding 15. AFlutter s/p DCCV/PAF: HR controlled on amiodarone. On coumadin.  16. Persistent cough with dysphonia: Off antibiotics.   Continue robinul for secretions and Robitussin DM for cough, will consider weaning when appropriate  Encourage IS with use of flutter valve 17. Sleep disturbance: managed with benadryl prn  LOS (Days) 1 A FACE TO FACE EVALUATION WAS PERFORMED  Berl Bonfanti Lorie Phenix 10/12/2016 8:03 AM

## 2016-10-12 NOTE — Evaluation (Signed)
Physical Therapy Assessment and Plan  Patient Details  Name: Deborah [name] MRN: [id number] Date of Birth: 06/07/1956  PT Diagnosis: Abnormality of gait, Difficulty walking and Muscle weakness Rehab Potential: Good ELOS: 14-17 days   Today's Date: 10/12/2016 PT Individual Time: 0900-1000 PT Individual Time Calculation (min): 60 min     Problem List: Patient Active Problem List   Diagnosis Date Noted  . Oropharyngeal dysphagia   . Uncontrolled type 2 diabetes mellitus with complication, with long-term current use of insulin ([place])   . Physical debility 10/11/2016  . Metabolic encephalopathy 48/12/6551  . Debility   . ESRD on dialysis ([place])   . Type 2 diabetes mellitus with complication, with long-term current use of insulin ([place])   . Anemia of chronic disease   . Coronary artery disease involving native coronary artery of native heart without angina pectoris   . Acute on chronic respiratory failure with hypoxia ([place])   . Atrial flutter ([place])   . Persistent cough   . Dysphonia   . Sleep disturbance   . Diabetes mellitus type 2 in nonobese ([name])   . Stage 3 chronic kidney disease   . NSTEMI (non-ST elevated myocardial infarction) ([place])   . Tachypnea   . Hyponatremia   . Lymphocytosis   . Acute blood loss anemia   . Pressure injury of skin 09/18/2016  . Tracheostomy status ([place])   . Delirium   . Encounter for attention to tracheostomy ([place])   . History of MVR with cardiopulmonary bypass   . Systolic congestive heart failure ([place])   . HCAP (healthcare-associated pneumonia)   . PAF (paroxysmal atrial fibrillation) ([place])   . Acute systolic congestive heart failure ([place])   . AKI (acute kidney injury) ([place])   . Abnormal LFTs   . S/P MVR (mitral valve replacement)   . Mitral regurgitation 08/23/2016  . Acute on chronic diastolic heart failure ([place]) 08/18/2016  . Acute pulmonary edema ([name])   . Respiratory failure ([place]) 08/17/2016  . Cardiogenic shock ([place]) 08/17/2016  .  Acute hypoxemic respiratory failure ([place])   . STEMI (ST elevation myocardial infarction) ([place]) 08/15/2016  . Noncompliance 01/25/2015  . Bereavement 07/09/2014  . Low back pain 02/17/2014  . Cervical cancer screening 11/20/2013  . Routine general medical examination at a health care facility 10/14/2013  . Chronic kidney disease, stage 3 06/14/2012  . Hypertension 03/08/2012  . Diabetes mellitus type 2, controlled 03/08/2012  . Hyperlipidemia 03/08/2012    Past Medical History:  Past Medical History:  Diagnosis Date  . Diabetes mellitus   . Hyperlipidemia   . Hypertension    Past Surgical History:  Past Surgical History:  Procedure Laterality Date  . AV FISTULA PLACEMENT Left 10/02/2016   Procedure: INSERTION OF ARTERIOVENOUS (AV) GORE-TEX GRAFT ARM;  Surgeon: Waynetta Sandy, MD;  Location: [facility];  Service: Vascular;  Laterality: Left;  . CARDIAC CATHETERIZATION N/A 08/15/2016   Procedure: Left Heart Cath and Coronary Angiography;  Surgeon: Yolonda Kida, MD;  Location: [facility];  Service: Cardiovascular;  Laterality: N/A;  . CARDIAC CATHETERIZATION N/A 08/15/2016   Procedure: Coronary Stent Intervention;  Surgeon: Yolonda Kida, MD;  Location: [facility];  Service: Cardiovascular;  Laterality: N/A;  . CARDIAC CATHETERIZATION N/A 08/18/2016   Procedure: Right Heart Cath;  Surgeon: Jolaine Artist, MD;  Location: [facility] [facility];  Service: Cardiovascular;  Laterality: N/A;  . CARDIAC CATHETERIZATION N/A 08/18/2016   Procedure: IABP Insertion;  Surgeon: Quillian Quince  R Bensimhon, MD;  Location: [facility];  Service: Cardiovascular;  Laterality: N/A;  . CARDIAC CATHETERIZATION Right 08/23/2016   Procedure: CENTRAL LINE INSERTION RIGHT SUBCLAVIAN;  Surgeon: Ivin Poot, MD;  Location: [facility];  Service: Open Heart Surgery;  Laterality: Right;  . ENDOVEIN HARVEST OF GREATER SAPHENOUS VEIN Left 08/23/2016   Procedure: ENDOVEIN HARVEST OF GREATER  SAPHENOUS VEIN;  Surgeon: Ivin Poot, MD;  Location: [facility];  Service: Open Heart Surgery;  Laterality: Left;  . INSERTION OF DIALYSIS CATHETER Bilateral 08/31/2016   Procedure: INSERTION OF DIALYSIS CATHETER LEFT INTERNAL JUGULAR VEIN & INSERTION OF TRIPLE LUMEN RIGHT INTERNAL JUGULAR VEIN;  Surgeon: Angelia Mould, MD;  Location: [facility];  Service: Vascular;  Laterality: Bilateral;  . INTRAOPERATIVE TRANSESOPHAGEAL ECHOCARDIOGRAM N/A 08/23/2016   Procedure: INTRAOPERATIVE TRANSESOPHAGEAL ECHOCARDIOGRAM;  Surgeon: Ivin Poot, MD;  Location: [facility];  Service: Open Heart Surgery;  Laterality: N/A;  . MITRAL VALVE REPAIR N/A 08/23/2016   Procedure: MITRAL VALVE REPAIR (MVR) USING 25MM EDWARDS MAGNA EASE BIOPROSTHESIS MITRAL  VALVE;  Surgeon: Ivin Poot, MD;  Location: [facility];  Service: Open Heart Surgery;  Laterality: N/A;  . VAGINAL DELIVERY     x 6    Assessment & Plan Clinical Impression: Deborah [name] a 60 y.o.femalewith history of T2DM, HTN, CKD stage III with medical non-compliance who was originally admitted to [facility] 08/15/16 with NSTEMI and underwent cardiac cath with stenting. History taken from chart review. She developed acute renal failure with metabolic acidosis and oliguria requiring CRRT. She went on to develop cardiogenic shock requiring intubation and was transferred to [facility] on 08/18/16 for treatment. 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 She underwent mitral valve repair by [facility]cott Gum on 9/21. Right IJ placed on 9/28 and HD initiated. She was extubated on 9/29 but reintubated due to septic shock due to staph bacteremia and required pressors for support. A flutter treated with TEE DCCV on 10/2. On  Coumadin and amiodarone for sinus tach with PAF. She underwent tracheostomy on 10/5 and was weaned to ATC and tolerating PMSV for short periods of time. Has completed treatment for enterobacter HCAP and was decannualted  on 10/25.  MBS done 10/23 showing moderate pharyngeal dysphagia--she was started on dysphagia 2, nectar liquids with chin tuck to prevent penetration.  LUE AV graft and tunneled diatek catheter placed on 10/30 as patient HD dependent. Has been waned off milrinone and is tolerating HD session with midodrine on board. She continues to have frequent productive cough as well as paroxysmal episodes with activity and has been treated with cipro due to  Concerns of bronchitis.  Follow up CXR done 11/5 due to episode of hypoxia/dyspnea and showed lungs to be clear. Panda removed due to concerns of aspiration. Follow up MBS done today and diet advanced to dysphagia 3, thin. Therapy ongoing and patient with weakness with balance and proprioceptive deficits, scissoring gait as well as dysphagia. CIR was recommended for follow up therapy. Patient transferred to CIR on 10/11/2016.   Patient currently requires mod with mobility secondary to muscle weakness and muscle joint tightness, decreased cardiorespiratoy endurance and decreased standing balance, decreased postural control and decreased balance strategies.  Prior to hospitalization, patient was independent  with mobility and lived with Family in a [place] home.  Home access is 6Stairs to enter.  Patient will benefit from skilled PT intervention to maximize safe functional mobility, minimize fall risk and  decrease caregiver burden for planned discharge home with 24 hour supervision.  Anticipate patient will benefit from follow up [place] at discharge.  PT - End of Session Activity Tolerance: Decreased this session;Tolerates < 10 min activity, no significant change in vital signs Endurance Deficit: Yes Endurance Deficit Description: patient required multiple seated rest breaks with mobility due to shortness of breath PT Assessment Rehab Potential (ACUTE/IP ONLY): Good Barriers to Discharge: Decreased caregiver support (pt unsure of support) PT Patient demonstrates  impairments in the following area(s): Balance;Endurance;Motor;Safety PT Transfers Functional Problem(s): Bed Mobility;Bed to Chair;Car;Furniture PT Locomotion Functional Problem(s): Ambulation;Wheelchair Mobility;Stairs PT Plan PT Intensity: Minimum of 1-2 x/day ,45 to 90 minutes PT Frequency: 5 out of 7 days PT Duration Estimated Length of Stay: 14-17 days PT Treatment/Interventions: Ambulation/gait training;Balance/vestibular training;Cognitive remediation/compensation;Community reintegration;Discharge planning;Disease management/prevention;DME/adaptive equipment instruction;Functional mobility training;Neuromuscular re-education;Pain management;Patient/family education;Psychosocial support;Stair training;Therapeutic Activities;Therapeutic Exercise;UE/LE Strength taining/ROM;UE/LE Coordination activities;Wheelchair propulsion/positioning PT Transfers Anticipated Outcome(s): supervision PT Locomotion Anticipated Outcome(s): supervision household PT Recommendation Follow Up Recommendations: Home health PT Patient destination: Home Equipment Recommended: To be determined  Skilled Therapeutic Intervention Skilled therapeutic intervention initiated after completion of evaluation. Discussed with patient falls risk, safety within room, and focus of therapy during stay. Discussed possible length of stay, goals, and follow-up therapy. Patient incontinent of bowel, sat EOB and ambulated to toilet with HHA with mod A overall and total A for hygiene and donning new brief in standing with min-mod A for balance. Performed simulated car transfer with mod A and stair training up/down 8 3" stairs using 2 rails with min A. Patient required frequent rest breaks due to shortness of breath and nonproductive coughing episodes. Patient requested to return to bed at end of session, left semi reclined with needs in reach and bed alarm on.   PT Evaluation Precautions/Restrictions Precautions Precautions:  Fall General Chart Reviewed: Yes Family/Caregiver Present: No  Pain Pain Assessment Pain Assessment: No/denies pain Home Living/Prior Functioning Home Living Living Arrangements: Children Available Help at Discharge: Family;Available PRN/intermittently Type of Home: Apartment Home Access: Stairs to enter Entrance Stairs-Number of Steps: 6 Entrance Stairs-Rails: Left Home Layout: One level Bathroom Shower/Tub: Chiropodist: Standard Bathroom Accessibility: Yes Additional Comments: per pt chart, daughter to work from home but currently working night shift  Lives With: Family Prior Function Level of Independence: Independent with basic ADLs;Independent with homemaking with ambulation  Able to Take Stairs?: Yes Driving: No Vocation: Other (comment) (disabled per pt report) Leisure: Hobbies-yes (Comment) Comments: go outside, read, do puzzle books Vision/Perception   No change from baseline  Cognition Overall Cognitive Status: Impaired/Different from baseline Arousal/Alertness: Awake/alert Memory: Appears intact Awareness: Impaired Problem Solving: Impaired Safety/Judgment: Impaired Sensation Sensation Light Touch: Impaired by gross assessment Additional Comments: intermittent tingling in B feet  Coordination Gross Motor Movements are Fluid and Coordinated: No Fine Motor Movements are Fluid and Coordinated: No Motor  Motor Motor: Within Functional Limits Motor - Skilled Clinical Observations: deconditioned and generalized weakness  Mobility Bed Mobility Bed Mobility: Supine to Sit;Sit to Supine Supine to Sit: 5: Supervision;With rails;HOB elevated Sit to Supine: 5: Supervision;With rail Transfers Transfers: Yes Stand Pivot Transfers: 3: Mod assist Locomotion  Ambulation Ambulation: Yes Ambulation/Gait Assistance: 3: Mod assist Ambulation Distance (Feet): 20 Feet Assistive device: None Gait Gait: Yes Gait velocity: decreased Stairs /  Additional Locomotion Stairs: Yes Stairs Assistance: 4: Min assist Stair Management Technique: Two rails;Step to pattern;Forwards Number of Stairs: 8 Height of Stairs: 3 Wheelchair Mobility Wheelchair Mobility: No  Trunk/Postural Assessment  Cervical  Assessment Cervical Assessment: Within Functional Limits Thoracic Assessment Thoracic Assessment: Within Functional Limits Lumbar Assessment Lumbar Assessment: Within Functional Limits Postural Control Postural Control: Deficits on evaluation Protective Responses: impaired/insufficient  Balance Balance Balance Assessed: Yes Static Standing Balance Static Standing - Balance Support: Right upper extremity supported;During functional activity Static Standing - Level of Assistance: 4: Min assist Dynamic Standing Balance Dynamic Standing - Balance Support: During functional activity Dynamic Standing - Level of Assistance: 3: Mod assist Dynamic Standing - Balance Activities: Reaching for weighted objects Extremity Assessment  RUE Assessment RUE Assessment: (P) Within Functional Limits (4-/5) LUE Assessment LUE Assessment: (P) Within Functional Limits (4-/5) RLE Assessment RLE Assessment: Exceptions to [facility] RLE Strength RLE Overall Strength: Deficits RLE Overall Strength Comments: grossly 3+ to 4/5 throughout LLE Assessment LLE Assessment: Exceptions to [facility] LLE Strength LLE Overall Strength: Deficits LLE Overall Strength Comments: grossly 3+ to 4/5 throughout   See Function Navigator for Current Functional Status.   Refer to Care Plan for Long Term Goals  Recommendations for other services: None  Discharge Criteria: Patient will be discharged from PT if patient refuses treatment 3 consecutive times without medical reason, if treatment goals not met, if there is a change in medical status, if patient makes no progress towards goals or if patient is discharged from hospital.  The above assessment, treatment plan, treatment  alternatives and goals were discussed and mutually agreed upon: by patient  Laretta Alstrom 10/12/2016, 12:35 PM

## 2016-10-12 NOTE — Care Management Note (Signed)
[facility] Individual Statement of Services  Patient Name:  Deborah [name]  Date:  10/12/2016  Welcome to the [facility].  Our goal is to provide you with an individualized program based on your diagnosis and situation, designed to meet your specific needs.  With this comprehensive rehabilitation program, you will be expected to participate in at least 3 hours of rehabilitation therapies Monday-Friday, with modified therapy programming on the weekends.  Your rehabilitation program will include the following services:  Physical Therapy (PT), Occupational Therapy (OT), Speech Therapy (ST), 24 hour per day rehabilitation nursing, Therapeutic Recreaction (TR), Case Management (Social Worker), Rehabilitation Medicine, Nutrition Services and Pharmacy Services  Weekly team conferences will be held on Wednesday to discuss your progress.  Your Social Worker will talk with you frequently to get your input and to update you on team discussions.  Team conferences with you and your family in attendance may also be held.  Expected length of stay: 14-17 days Overall anticipated outcome: supervision-mod/i level  Depending on your progress and recovery, your program may change. Your Social Worker will coordinate services and will keep you informed of any changes. Your Social Worker's name and contact numbers are listed  below.  The following services may also be recommended but are not provided by the [facility] will be made to provide these services after discharge if needed.  Arrangements include referral to agencies that provide these services.  Your insurance has been verified to be:  Pending Medicaid Your primary doctor is: Ronette Deter   Pertinent information will be shared with your doctor and your insurance  company.  Social Worker:  Ovidio Kin, [place] or ([phone]  Information discussed with and copy given to patient by: Elease Hashimoto, 10/12/2016, 9:18 AM

## 2016-10-12 NOTE — Progress Notes (Signed)
Nutrition Follow-up  DOCUMENTATION CODES:   Non-severe (moderate) malnutrition in context of chronic illness  INTERVENTION:  48 hour calorie count initiated.  Discontinue Nepro Shake.   Provide Boost Breeze po BID, each supplement provides 250 kcal and 9 grams of protein.  Provide 30 ml Prostat po TID, each supplement provides 100 kcal and 15 grams of protein.   Encourage adequate PO intake.   NUTRITION DIAGNOSIS:   Malnutrition related to chronic illness as evidenced by moderate depletion of body fat, moderate depletions of muscle mass.  GOAL:   Patient will meet greater than or equal to 90% of their needs  MONITOR:   PO intake, Supplement acceptance, Diet advancement, Labs, Weight trends, Skin, I & O's  REASON FOR ASSESSMENT:   Consult Calorie Count (poor po)  ASSESSMENT:   60 y.o. female with history of T2DM, HTN, CKD stage III with medical non-compliance who was originally admitted to [facility] 08/15/16 with NSTEMI and underwent cardiac cath with stenting.  She developed acute renal failure with metabolic acidosis and oliguria requiring CRRT. She went on to develop cardiogenic shock requiring intubation and was transferred to [facility] on 08/18/16 for treatment. She underwent mitral valve repair by [place] Gum on 9/21.  Right IJ placed on 9/28 and HD initiated.  She underwent tracheostomy on 10/5 and was weaned to ATC and tolerating PMSV for short periods of time. Has completed treatment for enterobacter HCAP and was decannualted on 10/25. Panda removed due to concerns of aspiration. Follow up MBS done today and diet advanced to dysphagia 3, thin. Therapy ongoing and patient with weakness with balance and proprioceptive deficits, scissoring gait as well as dysphagia.  Pt familiar to this RD from most recent acute hospitalization. Meal completion has been varied from 40-100% with 40% po at lunch today. Pt reports she has been enjoying her dysphagia 3 diet with thin liquids better as she has  been able to choose/order her foods at meals. Pt reports her chewing/swallowing has been improving. Pt currently has Nepro shake ordered and has been refusing them as she reports she is lactose intolerant. RD to discontinue Nepro Shake and order Boost Breeze and Prostat to aid in caloric and protein needs. Pt encouraged to eat her foods at meals. Pt reports usual body weight of ~149 lbs. Pt with a 12.7% reported weight loss, however weight loss likely related to fluid status. Calorie count has been initiated. NT aware of new orders. RD to follow up tomorrow with day 1 calorie count results. Noted Nepro formula ordered. Pt with no tube assess. RD to discontinue the order.  Nutrition-Focused physical exam completed. Findings are moderate fat depletion, moderate muscle depletion, and mild edema.   Labs and medications reviewed. CBG's 136-253 mg/dL. Phosphorous elevated at 7.5.  Diet Order:  DIET DYS 3 Room service appropriate? Yes; Fluid consistency: Thin  Skin:  Wound (see comment) (Stage II on neck)  Last BM:  11/8  Height:   Ht Readings from Last 1 Encounters:  10/11/16 5\' 1"  (1.549 m)    Weight:   Wt Readings from Last 1 Encounters:  10/11/16 130 lb 1.1 oz (59 kg)    Ideal Body Weight:  47.7 kg  BMI:  Body mass index is 24.58 kg/m.  Estimated Nutritional Needs:   Kcal:  1700-1900  Protein:  80-100 grams  Fluid:  Per MD  EDUCATION NEEDS:   No education needs identified at this time  Corrin Parker, MS, RD, LDN Pager # [phone] After hours/ weekend pager #  [phone]  

## 2016-10-12 NOTE — Progress Notes (Signed)
Social Work Assessment and Plan Social Work Assessment and Plan  Patient Details  Name: Deborah [name] MRN: [id number] Date of Birth: Apr 05, 1956  Today's Date: 10/12/2016  Problem List:  Patient Active Problem List   Diagnosis Date Noted  . Oropharyngeal dysphagia   . Uncontrolled type 2 diabetes mellitus with complication, with long-term current use of insulin ([place])   . Physical debility 10/11/2016  . Metabolic encephalopathy 61/95/0932  . Debility   . ESRD on dialysis ([place])   . Type 2 diabetes mellitus with complication, with long-term current use of insulin ([place])   . Anemia of chronic disease   . Coronary artery disease involving native coronary artery of native heart without angina pectoris   . Acute on chronic respiratory failure with hypoxia ([place])   . Atrial flutter ([place])   . Persistent cough   . Dysphonia   . Sleep disturbance   . Diabetes mellitus type 2 in nonobese ([name])   . Stage 3 chronic kidney disease   . NSTEMI (non-ST elevated myocardial infarction) ([place])   . Tachypnea   . Hyponatremia   . Lymphocytosis   . Acute blood loss anemia   . Pressure injury of skin 09/18/2016  . Tracheostomy status ([place])   . Delirium   . Encounter for attention to tracheostomy ([place])   . History of MVR with cardiopulmonary bypass   . Systolic congestive heart failure ([place])   . HCAP (healthcare-associated pneumonia)   . PAF (paroxysmal atrial fibrillation) ([place])   . Acute systolic congestive heart failure ([place])   . AKI (acute kidney injury) ([place])   . Abnormal LFTs   . S/P MVR (mitral valve replacement)   . Mitral regurgitation 08/23/2016  . Acute on chronic diastolic heart failure ([place]) 08/18/2016  . Acute pulmonary edema ([name])   . Respiratory failure ([place]) 08/17/2016  . Cardiogenic shock ([place]) 08/17/2016  . Acute hypoxemic respiratory failure ([place])   . STEMI (ST elevation myocardial infarction) ([place]) 08/15/2016  . Noncompliance 01/25/2015  . Bereavement 07/09/2014  . Low  back pain 02/17/2014  . Cervical cancer screening 11/20/2013  . Routine general medical examination at a health care facility 10/14/2013  . Chronic kidney disease, stage 3 06/14/2012  . Hypertension 03/08/2012  . Diabetes mellitus type 2, controlled 03/08/2012  . Hyperlipidemia 03/08/2012   Past Medical History:  Past Medical History:  Diagnosis Date  . Diabetes mellitus   . Hyperlipidemia   . Hypertension    Past Surgical History:  Past Surgical History:  Procedure Laterality Date  . AV FISTULA PLACEMENT Left 10/02/2016   Procedure: INSERTION OF ARTERIOVENOUS (AV) GORE-TEX GRAFT ARM;  Surgeon: Waynetta Sandy, MD;  Location: [facility];  Service: Vascular;  Laterality: Left;  . CARDIAC CATHETERIZATION N/A 08/15/2016   Procedure: Left Heart Cath and Coronary Angiography;  Surgeon: Yolonda Kida, MD;  Location: [facility];  Service: Cardiovascular;  Laterality: N/A;  . CARDIAC CATHETERIZATION N/A 08/15/2016   Procedure: Coronary Stent Intervention;  Surgeon: Yolonda Kida, MD;  Location: [facility];  Service: Cardiovascular;  Laterality: N/A;  . CARDIAC CATHETERIZATION N/A 08/18/2016   Procedure: Right Heart Cath;  Surgeon: Jolaine Artist, MD;  Location: [facility];  Service: Cardiovascular;  Laterality: N/A;  . CARDIAC CATHETERIZATION N/A 08/18/2016   Procedure: IABP Insertion;  Surgeon: Jolaine Artist, MD;  Location: [facility];  Service: Cardiovascular;  Laterality: N/A;  . CARDIAC CATHETERIZATION Right 08/23/2016   Procedure: CENTRAL LINE INSERTION  RIGHT SUBCLAVIAN;  Surgeon: Ivin Poot, MD;  Location: [facility];  Service: Open Heart Surgery;  Laterality: Right;  . ENDOVEIN HARVEST OF GREATER SAPHENOUS VEIN Left 08/23/2016   Procedure: ENDOVEIN HARVEST OF GREATER SAPHENOUS VEIN;  Surgeon: Ivin Poot, MD;  Location: [facility];  Service: Open Heart Surgery;  Laterality: Left;  . INSERTION OF DIALYSIS CATHETER Bilateral 08/31/2016    Procedure: INSERTION OF DIALYSIS CATHETER LEFT INTERNAL JUGULAR VEIN & INSERTION OF TRIPLE LUMEN RIGHT INTERNAL JUGULAR VEIN;  Surgeon: Angelia Mould, MD;  Location: [facility];  Service: Vascular;  Laterality: Bilateral;  . INTRAOPERATIVE TRANSESOPHAGEAL ECHOCARDIOGRAM N/A 08/23/2016   Procedure: INTRAOPERATIVE TRANSESOPHAGEAL ECHOCARDIOGRAM;  Surgeon: Ivin Poot, MD;  Location: [facility];  Service: Open Heart Surgery;  Laterality: N/A;  . MITRAL VALVE REPAIR N/A 08/23/2016   Procedure: MITRAL VALVE REPAIR (MVR) USING 25MM EDWARDS MAGNA EASE BIOPROSTHESIS MITRAL  VALVE;  Surgeon: Ivin Poot, MD;  Location: [facility];  Service: Open Heart Surgery;  Laterality: N/A;  . VAGINAL DELIVERY     x 6   Social History:  reports that she has never smoked. She has never used smokeless tobacco. She reports that she does not drink alcohol or use drugs.  Family / Support Systems Marital Status: Widow/Widower Patient Roles: Parent, Caregiver Children: Teresa-daughter [phone]-cell  [name]-daughter [phone]-cell  [name]  [phone]-cell  Sarah-daughter- [phone] Other Supports: Friends Anticipated Caregiver: [name] and [name] will be the main caregiver's-will work on a plan Ability/Limitations of Caregiver: Plan to move around their work schedules Caregiver Availability: 24/7 Family Dynamics: Close knit family pt has five daughter's and one son who are all involved and supportive. She has one daughter in [place] but the rest are in [place].  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 Will look toward her children since there is no formal POA in place    Abuse/Neglect Physical Abuse: Denies Verbal Abuse: Denies Sexual Abuse: Denies Exploitation of patient/patient's resources: Denies Self-Neglect: Denies  Emotional Status Pt's affect, behavior adn adjustment status: Pt is motivated and glad to be here on rehab, her last step before going home. She has always been able to care for herself and help her grandchildren she enjoys this and wants to get back to this. She will just see how she does while here. Recent Psychosocial Issues: multiple health issues-will need good follow up from here, she is aware of this. Pyschiatric History: No history deferred depression screen due to she is still adjusting to rehab and seems to be coping appropriately with all of this. She is positive and has a strong faith. Will monitor while here and have neuro-psych see while here. Substance Abuse History: No issues  Patient / Family Perceptions, Expectations & Goals Pt/Family understanding of illness & functional limitations: Pt and daughter's have spoken with the MD's and feel their questions and concerns are being addressed. Pt is ready to start therapies today. Pt feels she will have more questions as rehab begins. Premorbid pt/family roles/activities: Mom, Geophysicist/field seismologist, friend, church member, etc Anticipated changes in roles/activities/participation: resume Pt/family expectations/goals: Pt states: " I want to be able to take care of myself and help my grnadchildren"  Daughter states: " We will do whatever my Mom needs for  [place] to do she has always been there for [place] so now it is our turn."  [employer]: None Premorbid Home Care/DME Agencies: None Transportation available at discharge: family Resource referrals recommended: Support group (specify)  Discharge Planning Living Arrangements: Children Support Systems: Children, Water engineer, Social worker community Type of Residence: Private residence Insurance underwriter Resources: Government social research officer  (medicaid pending) Financial Resources: Family Support Financial Screen Referred: Yes Living Expenses: Lives with family Money Management: Family Does the patient have any problems obtaining your medications?: Yes (Describe) (has no insurance) Home Management: pt and now her daughter's Patient/Family Preliminary Plans: Plans to go to [place] home and have other daughter-[name] assist also. They are moving around their work schedules to be able to provide care to pt at discharge. Pt has four daughter's and one son in [place], willing to assist her. Social Work Anticipated Follow Up Needs: HH/OP, Support Group  Clinical Impression Pleasant motivated female who has always been able to take care of herself and others. She is willing to work hard and do her part to recover from this. She has very supportive and committed children willing to assist Her at discharge. Will make sure she has applied for Medicaid and SSD since she will be disabled due to HD now. Will see if needs neuro-psych services while here. Await team's evaluations and work on a safe discharge plan.  Elease Hashimoto 10/12/2016, 9:15 AM

## 2016-10-12 NOTE — IPOC Note (Signed)
Overall Plan of Care [facility]) Patient Details Name: Deborah [name] MRN: [id number] DOB: 1956-09-21  Admitting Diagnosis: Debility  MVR   Hospital Problems: Principal Problem:   Debility Active Problems:   Physical debility   Metabolic encephalopathy   Oropharyngeal dysphagia   Uncontrolled type 2 diabetes mellitus with complication, with long-term current use of insulin ([place])     Functional Problem List: Nursing Bladder, Bowel, Endurance, Medication Management, Motor, Nutrition, Perception, Safety, Sensory, Skin Integrity  PT Balance, Endurance, Motor, Safety  OT Balance, Cognition, Endurance, Motor, Safety  SLP Cognition, Linguistic  TR         Basic ADL's: OT Grooming, Bathing, Dressing, Toileting     Advanced  ADL's: OT Simple Meal Preparation     Transfers: PT Bed Mobility, Bed to Chair, Car, Manufacturing systems engineer, Metallurgist: PT Ambulation, Emergency planning/management officer, Stairs     Additional Impairments: OT    SLP Communication, Social Cognition comprehension, expression Problem Solving, Memory, Attention, Awareness  TR      Anticipated Outcomes Item Anticipated Outcome  Self Feeding    Swallowing      Basic self-care  Mod I  Toileting  Mod I   Bathroom Transfers Mod I  Bowel/Bladder  managed  bowel and bladder min assist HD tues thurs sat  Transfers  supervision  Locomotion  supervision household  Communication  Supervision   Cognition  Supervision   Pain  n/a  Safety/Judgment  min assist    Therapy Plan: PT Intensity: Minimum of 1-2 x/day ,45 to 90 minutes PT Frequency: 5 out of 7 days PT Duration Estimated Length of Stay: 14-17 days OT Intensity: Minimum of 1-2 x/day, 45 to 90 minutes OT Frequency: 5 out of 7 days OT Duration/Estimated Length of Stay: 14-17 days SLP Intensity: Minumum of 1-2 x/day, 30 to 90 minutes SLP Frequency: 3 to 5 out of 7 days SLP Duration/Estimated Length of Stay: 14-17 days        Team  Interventions: Nursing Interventions Patient/Family Education, Bladder Management, Bowel Management, Disease Management/Prevention, Medication Management, Skin Care/Wound Management, Cognitive Remediation/Compensation, Dysphagia/Aspiration Precaution Training, Discharge Planning, Psychosocial Support  PT interventions Ambulation/gait training, Training and development officer, Cognitive remediation/compensation, Community reintegration, Discharge planning, Disease management/prevention, DME/adaptive equipment instruction, Functional mobility training, Neuromuscular re-education, Pain management, Patient/family education, Psychosocial support, Stair training, Therapeutic Activities, Therapeutic Exercise, UE/LE Strength taining/ROM, UE/LE Coordination activities, Wheelchair propulsion/positioning  OT Interventions Training and development officer, Cognitive remediation/compensation, Academic librarian, Discharge planning, DME/adaptive equipment instruction, Functional mobility training, Patient/family education, Psychosocial support, Self Care/advanced ADL retraining, Therapeutic Activities, Therapeutic Exercise, UE/LE Strength taining/ROM, UE/LE Coordination activities  SLP Interventions Cognitive remediation/compensation, English as a second language teacher, Environmental controls, Functional tasks, Internal/external aids, Patient/family education  TR Interventions    SW/CM Interventions Discharge Planning, Psychosocial Support, Patient/Family Education    Team Discharge Planning: Destination: PT-Home ,OT- Home , SLP-Home Projected Follow-up: PT-Home health PT, OT-  Home health OT, SLP-Home Health SLP, Outpatient SLP, 24 hour supervision/assistance Projected Equipment Needs: PT-To be determined, OT- Tub/shower bench, SLP-None recommended by SLP Equipment Details: PT- , OT-  Patient/family involved in discharge planning: PT- Patient,  OT-Patient, SLP-Patient  MD ELOS: 14-17 days. Medical Rehab Prognosis:  Good Assessment:   60 y.o.femalewith history of T2DM, HTN, CKD stage III with medical non-compliance who was originally admitted to [facility]15[facility] with NSTEMI and underwent cardiac cath with stenting. She developed acute renal failure with metabolic acidosis and oliguria requiring CRRT. She went on to develop cardiogenic shock requiring intubation and was transferred to  [facility] on 08/18/16 for treatment. 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 She underwent mitral valve repair by Prescott Gum on 9/21. Right IJ placed on 9/28 and HD initiated. She was extubated on 9/29 but reintubated due to septic shock due to staph bacteremia and required pressors for support. A flutter treated with TEE DCCV on 10/2. On  Coumadin and amiodarone for sinus tach with PAF. She underwent tracheostomy on 10/5 and was weaned to ATC and tolerating PMSV for short periods of time. Has completed treatment for enterobacter HCAP and was decannualted on 10/25.  MBS done 10/23 showing moderate pharyngeal dysphagia--she was started on dysphagia 2, nectar liquids with chin tuck to prevent penetration.  LUE AV graft and tunneled diatek catheter placed on 10/30 as patient HD dependent. Has been waned off milrinone and is tolerating HD session with midodrine on board. She continues to have frequent productive cough as well as paroxysmal episodes with activity and has been treated with cipro due to  Concerns of bronchitis.  Follow up CXR done 11/5 due to episode of hypoxia/dyspnea and showed lungs to be clear. Panda removed due to concerns of aspiration. Follow up MBS done and diet advanced to dysphagia 3, thin. Therapy ongoing and patient with weakness, balance deficits, poor endurance, proprioceptive deficits, scissoring gait as well as dysphagia. Pt will set goals for Mod I with PT/OT and Supervision/Min A with SLP.  See Team Conference Notes for weekly updates to the plan of care

## 2016-10-13 ENCOUNTER — Inpatient Hospital Stay (HOSPITAL_COMMUNITY): Payer: Self-pay | Admitting: Occupational Therapy

## 2016-10-13 ENCOUNTER — Inpatient Hospital Stay (HOSPITAL_COMMUNITY): Payer: Self-pay | Admitting: Speech Pathology

## 2016-10-13 ENCOUNTER — Inpatient Hospital Stay (HOSPITAL_COMMUNITY): Payer: Medicaid Other | Admitting: Physical Therapy

## 2016-10-13 DIAGNOSIS — R791 Abnormal coagulation profile: Secondary | ICD-10-CM | POA: Insufficient documentation

## 2016-10-13 DIAGNOSIS — R059 Cough, unspecified: Secondary | ICD-10-CM

## 2016-10-13 DIAGNOSIS — R05 Cough: Secondary | ICD-10-CM

## 2016-10-13 LAB — PROTIME-INR
INR: 3.37
Prothrombin Time: 34.9 seconds — ABNORMAL HIGH (ref 11.4–15.2)

## 2016-10-13 LAB — RENAL FUNCTION PANEL
ANION GAP: 13 (ref 5–15)
Albumin: 2.8 g/dL — ABNORMAL LOW (ref 3.5–5.0)
BUN: 12 mg/dL (ref 6–20)
CHLORIDE: 95 mmol/L — AB (ref 101–111)
CO2: 24 mmol/L (ref 22–32)
CREATININE: 2.74 mg/dL — AB (ref 0.44–1.00)
Calcium: 8.1 mg/dL — ABNORMAL LOW (ref 8.9–10.3)
GFR calc non Af Amer: 18 mL/min — ABNORMAL LOW (ref >60)
GFR, EST AFRICAN AMERICAN: 21 mL/min — AB (ref >60)
GLUCOSE: 143 mg/dL — AB (ref 65–99)
Phosphorus: 5.9 mg/dL — ABNORMAL HIGH (ref 2.5–4.6)
Potassium: 3.9 mmol/L (ref 3.5–5.1)
Sodium: 132 mmol/L — ABNORMAL LOW (ref 135–145)

## 2016-10-13 LAB — GLUCOSE, CAPILLARY
GLUCOSE-CAPILLARY: 151 mg/dL — AB (ref 65–99)
GLUCOSE-CAPILLARY: 248 mg/dL — AB (ref 65–99)
GLUCOSE-CAPILLARY: 192 mg/dL — AB (ref 65–99)
GLUCOSE-CAPILLARY: 189 mg/dL — AB (ref 65–99)

## 2016-10-13 MED ORDER — BOOST / RESOURCE BREEZE PO LIQD
1.0000 | Freq: Three times a day (TID) | ORAL | Status: DC
  Administered 2016-10-13 – 2016-11-01 (×29): 1 via ORAL

## 2016-10-13 MED ORDER — HYDROCODONE-ACETAMINOPHEN 7.5-325 MG/15ML PO SOLN: 15.0000 mL | Freq: Four times a day (QID) | ORAL | Status: DC | PRN

## 2016-10-13 MED ORDER — INSULIN DETEMIR 100 UNIT/ML ~~LOC~~ SOLN
7.0000 [IU] | Freq: Two times a day (BID) | SUBCUTANEOUS | Status: DC
  Administered 2016-10-13 – 2016-10-15 (×5): 7 [IU] via SUBCUTANEOUS
  Filled 2016-10-13 (×7): qty 0.07

## 2016-10-13 MED ORDER — PANTOPRAZOLE SODIUM 20 MG PO TBEC
20.0000 mg | DELAYED_RELEASE_TABLET | Freq: Two times a day (BID) | ORAL | Status: DC
  Administered 2016-10-13 – 2016-10-31 (×36): 20 mg via ORAL
  Filled 2016-10-13 (×37): qty 1

## 2016-10-13 NOTE — Progress Notes (Signed)
Day 1 of 2: Calorie Count Note  48 hour calorie count ordered.  Diet: Dysphagia 3 diet with thin liquids Supplements:   Boost Breeze po BID, each supplement provides 250 kcal and 9 grams of protein.  30 ml Prostat po TID, each supplement provides 100 kcal and 15 grams of protein.   Breakfast: 275 kcal, 13 grams of protein Lunch: 97 kcal, 3 grams of protein Dinner: no information provided Supplements: 450 kcal, 39 grams of protein  Day 1 Total intake: 822 kcal (48% of minimum estimated needs)  55 grams of protein (69% of minimum estimated needs)  Estimated Nutritional Needs:  Kcal:  1700-1900 Protein:  80-100 grams Fluid:  Per MD  Nutrition Dx:  Malnutrition related to chronic illness as evidenced by moderate depletion of body fat, moderate depletions of muscle mass; ongoing  Goal:  Pt to meet >/= 90% of their estimated nutrition needs; not met progressing  Intervention:   Continue 48 hour calorie count. ==> RD to follow up Monday 11/13 with full calorie count results.  Provide Boost Breeze po TID, each supplement provides 250 kcal and 9 grams of protein.  Provide 30 ml Prostat po TID, each supplement provides 100 kcal and 15 grams of protein.   Encourage adequate PO intake.   Corrin Parker, MS, RD, LDN Pager # [phone] After hours/ weekend pager # [phone]

## 2016-10-13 NOTE — Evaluation (Signed)
Speech Language Pathology Assessment and Plan  Patient Details  Name: Deborah [name] MRN: [id number] Date of Birth: 01-27-1956  SLP Diagnosis: Cognitive Impairments;Voice disorder;Dysphagia  Rehab Potential: Good ELOS: 14-17 days     Today's Date: 10/13/2016 SLP Individual Time: 5956-3875 SLP Individual Time Calculation (min): 60 min    Problem List: Patient Active Problem List   Diagnosis Date Noted  . Supratherapeutic INR   . Cough   . Oropharyngeal dysphagia   . Uncontrolled type 2 diabetes mellitus with complication, with long-term current use of insulin ([place])   . Physical debility 10/11/2016  . Metabolic encephalopathy 64/33/2951  . Debility   . ESRD on dialysis ([place])   . Type 2 diabetes mellitus with complication, with long-term current use of insulin ([place])   . Anemia of chronic disease   . Coronary artery disease involving native coronary artery of native heart without angina pectoris   . Acute on chronic respiratory failure with hypoxia ([place])   . Atrial flutter ([place])   . Persistent cough   . Dysphonia   . Sleep disturbance   . Diabetes mellitus type 2 in nonobese ([name])   . Stage 3 chronic kidney disease   . NSTEMI (non-ST elevated myocardial infarction) ([place])   . Tachypnea   . Hyponatremia   . Lymphocytosis   . Acute blood loss anemia   . Pressure injury of skin 09/18/2016  . Tracheostomy status ([place])   . Delirium   . Encounter for attention to tracheostomy ([place])   . History of MVR with cardiopulmonary bypass   . Systolic congestive heart failure ([place])   . HCAP (healthcare-associated pneumonia)   . PAF (paroxysmal atrial fibrillation) ([place])   . Acute systolic congestive heart failure ([place])   . AKI (acute kidney injury) ([place])   . Abnormal LFTs   . S/P MVR (mitral valve replacement)   . Mitral regurgitation 08/23/2016  . Acute on chronic diastolic heart failure ([place]) 08/18/2016  . Acute pulmonary edema ([name])   . Respiratory failure ([place]) 08/17/2016  .  Cardiogenic shock ([place]) 08/17/2016  . Acute hypoxemic respiratory failure ([place])   . STEMI (ST elevation myocardial infarction) ([place]) 08/15/2016  . Noncompliance 01/25/2015  . Bereavement 07/09/2014  . Low back pain 02/17/2014  . Cervical cancer screening 11/20/2013  . Routine general medical examination at a health care facility 10/14/2013  . Chronic kidney disease, stage 3 06/14/2012  . Hypertension 03/08/2012  . Diabetes mellitus type 2, controlled 03/08/2012  . Hyperlipidemia 03/08/2012   Past Medical History:  Past Medical History:  Diagnosis Date  . Diabetes mellitus   . Hyperlipidemia   . Hypertension    Past Surgical History:  Past Surgical History:  Procedure Laterality Date  . AV FISTULA PLACEMENT Left 10/02/2016   Procedure: INSERTION OF ARTERIOVENOUS (AV) GORE-TEX GRAFT ARM;  Surgeon: Waynetta Sandy, MD;  Location: [place];  Service: Vascular;  Laterality: Left;  . CARDIAC CATHETERIZATION N/A 08/15/2016   Procedure: Left Heart Cath and Coronary Angiography;  Surgeon: Yolonda Kida, MD;  Location: [place] [facility];  Service: Cardiovascular;  Laterality: N/A;  . CARDIAC CATHETERIZATION N/A 08/15/2016   Procedure: Coronary Stent Intervention;  Surgeon: Yolonda Kida, MD;  Location: [place] [facility];  Service: Cardiovascular;  Laterality: N/A;  . CARDIAC CATHETERIZATION N/A 08/18/2016   Procedure: Right Heart Cath;  Surgeon: Jolaine Artist, MD;  Location: [place] [facility];  Service: Cardiovascular;  Laterality: N/A;  . CARDIAC CATHETERIZATION N/A 08/18/2016  Procedure: IABP Insertion;  Surgeon: Jolaine Artist, MD;  Location: [place] [facility];  Service: Cardiovascular;  Laterality: N/A;  . CARDIAC CATHETERIZATION Right 08/23/2016   Procedure: CENTRAL LINE INSERTION RIGHT SUBCLAVIAN;  Surgeon: Ivin Poot, MD;  Location: [place];  Service: Open Heart Surgery;  Laterality: Right;  . ENDOVEIN HARVEST OF GREATER SAPHENOUS VEIN Left 08/23/2016    Procedure: ENDOVEIN HARVEST OF GREATER SAPHENOUS VEIN;  Surgeon: Ivin Poot, MD;  Location: [place];  Service: Open Heart Surgery;  Laterality: Left;  . INSERTION OF DIALYSIS CATHETER Bilateral 08/31/2016   Procedure: INSERTION OF DIALYSIS CATHETER LEFT INTERNAL JUGULAR VEIN & INSERTION OF TRIPLE LUMEN RIGHT INTERNAL JUGULAR VEIN;  Surgeon: Angelia Mould, MD;  Location: [place];  Service: Vascular;  Laterality: Bilateral;  . INTRAOPERATIVE TRANSESOPHAGEAL ECHOCARDIOGRAM N/A 08/23/2016   Procedure: INTRAOPERATIVE TRANSESOPHAGEAL ECHOCARDIOGRAM;  Surgeon: Ivin Poot, MD;  Location: [place];  Service: Open Heart Surgery;  Laterality: N/A;  . MITRAL VALVE REPAIR N/A 08/23/2016   Procedure: MITRAL VALVE REPAIR (MVR) USING 25MM EDWARDS MAGNA EASE BIOPROSTHESIS MITRAL  VALVE;  Surgeon: Ivin Poot, MD;  Location: [place];  Service: Open Heart Surgery;  Laterality: N/A;  . VAGINAL DELIVERY     x 6    Assessment / Plan / Recommendation Clinical Impression  Pt seen this date for clinical assessment of swallow function and cognitive-linguistic therapy. Pt was able to recall swallow precautions independently, but required intermittent verbal cueing to consistently utilize chin tuck. Pt is currently on Dys 3 and thin with chin tuck. Recommend continuation of current diet supervision to reinforce use of swallow precautions. Pt was trialed with regular consistency food items, but given persistent cough, it would be best to avoid dry, crumbly foods at this time. Pt verbalized agreement.  Skilled Therapeutic Interventions          Pt able to reliably sequence a complex functional activity with min verbal cueing for initiation. Discussed medication management and pt verbalized receptivity to try med box to increase organization and safety with medications. Will address in subsequent therapy sessions.   SLP Assessment  Patient will need skilled Speech Lanaguage Pathology Services during CIR admission     Recommendations  SLP Diet Recommendations: Dysphagia 3 (Mech soft);Thin Liquid Administration via: Cup;Straw Medication Administration: Whole meds with puree Supervision: Patient able to self feed;Full supervision/cueing for compensatory strategies Compensations: Minimize environmental distractions;Slow rate;Small sips/bites;Chin tuck;Follow solids with liquid Postural Changes and/or Swallow Maneuvers: Upright 30-60 min after meal;Seated upright 90 degrees Oral Care Recommendations: Oral care BID Patient destination: Home Follow up Recommendations: Home Health SLP;Outpatient SLP;24 hour supervision/assistance Equipment Recommended: None recommended by SLP    SLP Frequency 3 to 5 out of 7 days   SLP Duration  SLP Intensity  SLP Treatment/Interventions 14-17 days   Minumum of 1-2 x/day, 30 to 90 minutes  Dysphagia/aspiration precaution training;Environmental controls;Patient/family education    Pain    Prior Functioning    Function:  Eating Eating   Modified Consistency Diet: Yes Eating Assist Level: Supervision or verbal cues;More than reasonable amount of time           Cognition Comprehension Comprehension assist level: Understands basic 90% of the time/cues < 10% of the time  Expression   Expression assist level: Expresses basic 75 - 89% of the time/requires cueing 10 - 24% of the time. Needs helper to occlude trach/needs to repeat words.  Social Interaction Social Interaction assist level: Interacts appropriately 90% of the time -  Needs monitoring or encouragement for participation or interaction.  Problem Solving Problem solving assist level: Solves basic 75 - 89% of the time/requires cueing 10 - 24% of the time  Memory Memory assist level: Recognizes or recalls 75 - 89% of the time/requires cueing 10 - 24% of the time   Short Term Goals: Week 1: SLP Short Term Goal 1 (Week 1): Pt will selectively attend to tasks in a mildly distracting environment for 5-7  minutes with supervision verbal cues for redirection SLP Short Term Goal 2 (Week 1): Pt will complete basic familiar tasks with supervision verbal cues for functional problem solving.  SLP Short Term Goal 3 (Week 1): Pt will recall new information with supervision verbal cues for use of external aids.  SLP Short Term Goal 4 (Week 1): Pt will return demonstration of vocal hygiene techniques with min assist verbal cues.   SLP Short Term Goal 5 (Week 1): Pt will observe swallow precautions with PO intake at mod I level.  SLP Short Term Goal 6 (Week 1): Pt will tolerate trials of regular with oral management WFL at mod I.  Refer to Care Plan for Long Term Goals  Recommendations for other services: None  Discharge Criteria: Patient will be discharged from SLP if patient refuses treatment 3 consecutive times without medical reason, if treatment goals not met, if there is a change in medical status, if patient makes no progress towards goals or if patient is discharged from hospital.  The above assessment, treatment plan, treatment alternatives and goals were discussed and mutually agreed upon: by patient  Vinetta Bergamo MA, [place] 10/13/2016, 11:02 AM

## 2016-10-13 NOTE — Progress Notes (Signed)
Physical Therapy Session Note  Patient Details  Name: Deborah [name] MRN: [id number] Date of Birth: January 24, 1956  Today's Date: 10/13/2016 PT Individual Time: 1130-1200 and 1315-1345 PT Individual Time Calculation (min): 30 min and 30 min   Short Term Goals: Week 1:  PT Short Term Goal 1 (Week 1): Patient will perform transfers with min A.  PT Short Term Goal 2 (Week 1): Patient will ambulate 75 ft with min A.  PT Short Term Goal 3 (Week 1): Patient will maintain dynamic standing balance x 1 min with min A.  PT Short Term Goal 4 (Week 1): Patient will negotiate up/down 4 stairs using L rail only with min A.  PT Short Term Goal 5 (Week 1): Patient will propel wheelchair x 150 ft with supervision for strengthening.   Skilled Therapeutic Interventions/Progress Updates:    Treatment 1: Patient's daughter and granddaughter present and involved in session focused on bed mobility with supervision, sit <> stand transfers with mod A and verbal cues for safe hand placement, wheelchair propulsion x 100 ft using BUE with supervision and increased time, gait training without device x 80 ft with mod A overall and using RW x 80 ft with min A overall, and transfer to toilet due to stress bowel incontinence with frequent prolonged coughing. Patient initially reporting feeling swimmy headed with seated vitals 116/70, HR 99, and Sp02 97% and patient reporting that coughing causes onset of symptoms. Patient's daughter reports that patient will have 8-10 steps with landing and 1 rail to enter daughter Deborah [name]'s apartment where she plans to go at time of discharge. Patient requires seated rest breaks with mobility due to shortness of breath and coughing. Patient left sitting on toilet with family present, nurse tech notified of patient position.   Treatment 2: Patient in bed upon arrival with family arriving later to observe session. Patient transferred to wheelchair with mod A without device and ambulated up/down curb  and across compliant mulch surface using RW with min A overall and initial demonstration. Patient demonstrates high fall risk as noted by score of 18/56 on Berg Balance Scale, education provided on falls risk and safety in room, see details below. Patient agreeable to staying OOB to increase upright tolerance and left sitting in wheelchair to return to room with family.    Therapy Documentation Precautions:  Precautions Precautions: Fall Restrictions Weight Bearing Restrictions: No Pain: Pain Assessment Pain Assessment: No/denies pain Balance: Balance Balance Assessed: Yes Standardized Balance Assessment Standardized Balance Assessment: Berg Balance Test Berg Balance Test Sit to Stand: Able to stand  independently using hands Standing Unsupported: Able to stand 2 minutes with supervision Sitting with Back Unsupported but Feet Supported on Floor or Stool: Able to sit safely and securely 2 minutes Stand to Sit: Sits independently, has uncontrolled descent Transfers: Able to transfer with verbal cueing and /or supervision Standing Unsupported with Eyes Closed: Able to stand 3 seconds Standing Ubsupported with Feet Together: Able to place feet together independently but unable to hold for 30 seconds From Standing, Reach Forward with Outstretched Arm: Reaches forward but needs supervision From Standing Position, Pick up Object from Floor: Unable to try/needs assist to keep balance From Standing Position, Turn to Look Behind Over each Shoulder: Needs assist to keep from losing balance and falling Turn 360 Degrees: Needs assistance while turning Standing Unsupported, Alternately Place Feet on Step/Stool: Needs assistance to keep from falling or unable to try Standing Unsupported, One Foot in Front: Loses balance while stepping or standing Standing on  One Leg: Unable to try or needs assist to prevent fall Total Score: 18/56   See Function Navigator for Current Functional  Status.   Therapy/Group: Individual Therapy  Deborah [name] 10/13/2016, 12:55 PM

## 2016-10-13 NOTE — Progress Notes (Signed)
ANTICOAGULATION CONSULT NOTE - Follow Up Consult  Pharmacy Consult for coumadin Indication: a. flutter  No Known Allergies  Patient Measurements: Height: 5\' 1"  (154.9 cm) Weight: 125 lb 14.1 oz (57.1 kg) IBW/kg (Calculated) : 47.8 Heparin Dosing Weight:   Vital Signs: Temp: 98.2 F (36.8 C) (11/10 0535) Temp Source: Oral (11/10 0535) BP: 109/89 (11/10 0535) Pulse Rate: 91 (11/10 0535)  Labs:  Recent Labs  10/11/16 0348 10/11/16 1720 10/12/16 0628 10/12/16 0906 10/13/16 0538  HGB 10.3*  --  10.5*  --   --   HCT 33.5*  --  33.2*  --   --   PLT 475*  --  481*  --   --   LABPROT 34.8*  --   --  37.2* 34.9*  INR 3.35  --   --  3.65 3.37  CREATININE 3.44* 4.20* 5.04*  --  2.74*    Estimated Creatinine Clearance: 16.7 mL/min (by C-G formula based on SCr of 2.74 mg/dL (H)).  Assessment: 60 yo female with a flutter is currently on suprtherapeutic coumadin.  INR today is 3.37. Held coumadin since 11/8.   Goal of Therapy:  INR 2 - 2.5 per MD Monitor platelets by anticoagulation protocol: Yes   Plan:  - No coumadin tonight - INR in am  Manley Mason 10/13/2016,9:31 AM

## 2016-10-13 NOTE — Progress Notes (Signed)
Occupational Therapy Session Note  Patient Details  Name: Deborah [name] MRN: [id number] Date of Birth: 1956/03/03  Today's Date: 10/13/2016 OT Individual Time: 1000-1100 OT Individual Time Calculation (min): 60 min   Short Term Goals: Week 1:  OT Short Term Goal 1 (Week 1): Pt will tolerate 5 minutes standing at the sink to complete ADL tasks  with close supervision.  OT Short Term Goal 2 (Week 1): Pt will complete LB dressing with Min A OT Short Term Goal 3 (Week 1): Pt will complete toilet transfer with overall Min A OT Short Term Goal 4 (Week 1): Pt will follow 75% commands with min questioning cues.  Skilled Therapeutic Interventions/Progress Updates:   1:1 OT session focused on activity tolerance, modified bathing/dressingm sit<>stands, and cognitive retraining. Pt completed stand-pivot transfers with Min A and mod instructional cues for safe technique. Pt incontinent of stool, requiring dependent peri-care, pt continued to have loose stool throughout session with strain of coughing. Min A for sit<>stand at the sink and min A to maintain standing balance 2/2 posterior lean. Pt required Mod questioning cues for thoroughness during bathing tasks. Pt then brought to therapy gym and worked on problem solving with tall stacker peg board. Pt required 1 questioning cue to correct mistake. Pt fatigued with activity and brought back to room. Pt left semi-reclined in bed with needs met and daughter present.   Therapy Documentation Precautions:  Precautions Precautions: Fall Restrictions Weight Bearing Restrictions: Yes (sternal precautions) Pain:  none/denies pain ADL: ADL ADL Comments: Please see functional navigator  See Function Navigator for Current Functional Status.  Therapy/Group: Individual Therapy  Valma Cava 10/13/2016, 12:16 PM

## 2016-10-13 NOTE — Plan of Care (Signed)
Problem: Food- and Nutrition-Related Knowledge Deficit (NB-1.1) Goal: Nutrition education Formal process to instruct or train a patient/client in a skill or to impart knowledge to help patients/clients voluntarily manage or modify food choices and eating behavior to maintain or improve health. Outcome: Completed/Met Date Met: 10/13/16 Nutrition Education Note  RD consulted for renal/carbohydrate modified. Provided "Eating healthy with kidney disease" handout and "Counting carbohydrates with people with diabetes" from the Academy of Nutrition and Dietetics Manual to patient/family. Daughter at bedside. Reviewed food groups and provided written recommended serving sizes specifically determined for patient's current nutritional status.   Explained why diet restrictions are needed and provided lists of foods to limit/avoid that are high potassium, sodium, and phosphorus. Provided specific recommendations on safer alternatives of these foods. Strongly encouraged compliance of this diet.   Discussed importance of protein intake at each meal and snack. Provided examples of how to maximize protein intake throughout the day. Discussed and renal and diabetic friendly beverage options. Teach back method used.  Expect good compliance.  Corrin Parker, MS, RD, LDN Pager # [phone] After hours/ weekend pager # [phone]

## 2016-10-13 NOTE — Progress Notes (Signed)
Patient ID: Deborah [name], female   DOB: 1956-11-13, 60 y.o.   MRN: [id number]  [facility] Progress Note   Subjective:    Doing therapies Still with cough HD yesterday EDW established 57 kg    Objective:   BP 109/89 (BP Location: Right Arm)   Pulse 91   Temp 98.2 F (36.8 C) (Oral)   Resp 16   Ht 5\' 1"  (1.549 m)   Wt 57.1 kg (125 lb 14.1 oz)   SpO2 98%   BMI 23.79 kg/m   Physical Exam: VS as noted NAD Prev trach site closed Chest is clear anteriorly but has cough with deep breathing  S1S2 No S3 Healed sternotomy scar Abd soft and not tender No edema of LE's Left upper arm AVG + bruit (10/30) Left sided TDC (9/28 VVS)   Recent Labs Lab 10/07/16 0904 10/08/16 0123 10/09/16 0256 10/10/16 0356 10/11/16 0348 10/11/16 1720 10/12/16 0628 10/13/16 0538  NA 128* 134* 134* 133* 133* 129* 130* 132*  K 4.0 3.5 4.1 4.0 3.4* 4.6 4.2 3.9  CL 90* 94* 94* 91* 94* 93* 94* 95*  CO2 23 25 24 24 26 23 23 24   GLUCOSE 233* 238* 150* 132* 53* 195* 146* 143*  BUN 96* 34* 60* 82* 22* 27* 32* 12  CREATININE 4.24* 2.66* 4.59* 5.88* 3.44* 4.20* 5.04* 2.74*  CALCIUM 8.9 9.1 9.1 9.1 8.5* 8.4* 8.5* 8.1*  PHOS 2.3*  --   --  7.2* 5.3*  --  7.5* 5.9*   Iron/TIBC/Ferritin/ %Sat    Component Value Date/Time   IRON 22 (L) 10/10/2016 0356   TIBC 326 10/10/2016 0356   FERRITIN 272 10/10/2016 0356   IRONPCTSAT 7 (L) 10/10/2016 0356   Results for ENIS, LEATHERWOOD (MRN [id number]) as of 10/12/2016 08:46  Ref. Range 10/10/2016 03:56  PTH Latest Ref Range: 15 - 65 pg/mL 199 (H)    Recent Labs Lab 10/07/16 0904 10/10/16 0356 10/11/16 0348 10/12/16 0628  WBC 17.8* 18.7* 15.1* 15.0*  NEUTROABS  --   --   --  11.1*  HGB 10.6* 11.1* 10.3* 10.5*  HCT 34.3* 34.6* 33.5* 33.2*  MCV 89.6 86.7 87.9 86.7  PLT 390 529* 475* 481*    Lab Results  Component Value Date   INR 3.37 10/13/2016   INR 3.65 10/12/2016   INR 3.35 10/11/2016    Medications:    . amiodarone   200 mg Oral BID  . atorvastatin  20 mg Oral q1800  . benzonatate  100 mg Oral TID  . clopidogrel  75 mg Oral Daily  . [START ON 10/17/2016] darbepoetin (ARANESP) injection - DIALYSIS  100 mcg Intravenous Q Tue-HD  . famotidine  20 mg Oral Daily  . feeding supplement  1 Container Oral BID BM  . feeding supplement (PRO-STAT SUGAR FREE 64)  30 mL Oral TID  . ferric gluconate (FERRLECIT/NULECIT) IV  250 mg Intravenous Q T,Th,Sa-HD  . Gerhardt's butt cream   Topical BID  . glycopyrrolate  1 mg Per Tube BID  . guaiFENesin  30 mL Per Tube Q6H  . hydrocortisone   Rectal BID  . insulin aspart  0-5 Units Subcutaneous QHS  . insulin aspart  0-9 Units Subcutaneous TID WC  . insulin detemir  7 Units Subcutaneous BID  . lanthanum  1,000 mg Oral TID WC  . metoCLOPramide  10 mg Oral TID AC  . midodrine  10 mg Oral q morning - 10a  . multivitamin  1 tablet Oral  QHS  . multivitamin  15 mL Per Tube Daily  . pantoprazole  20 mg Oral BID  . sodium chloride flush  10-40 mL Intracatheter Q12H  . Warfarin - Pharmacist Dosing Inpatient   Does not apply q1800   Background: 60 y/o AAF with DM2, HTN, HLD presented to [facility] ER with infposterior STEMI on 9/12->cath->. PCS with stent x 2 by Dr. Clayborn Bigness. EF 55-60% with severe MR.  Post-cath respiratory failure ->intubation. Shock and renal failure (creatinine 1.6 on adm). Trialysis catheter placed 9/14 and pt had CR[facility] at [facility] for 48 hours prior to transfer here on 08/18/16->back to cath lab -> ruptured posterior papillary muscle with severe MR->IABP/[place]. We were asked to see d/t progressive rise in creatinine/volume overload/anuric AKI .  CRRT re-initiated 08/21/16-9/20, then 9/21 post MVR. Transitioned to IHD.  Dialysis dependent since 08/17/16. No recovery. New ESRD.  Assessment/ Plan:    1. 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 Amio. Coumadin. Midodrine for BP.  2. 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   TTS schedule via Left TDC (9/28 [place]). S/P LUA AVG placed on 10/30 (brachioaxillary) by Dr. Donzetta Matters - will be ready by Nov 30. EDW looks to be about 57 kg. (outpt has spot [place] TTS) Continue TTS 3. Anemia: Secondary to anemia of ESRD/Fe def - on darbe 100 QTuesday. TSat 7%. Fe load with HD. Last Hb 10.5. 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 Added non ca based binder -  Fosrenol - and is starting to decline some. May need higher dose - will follow phos.  PTH 199 no intervention needed. 2Ca bath with HD 5. Nutrition: Low albumin secondary to critical illness/prolonged hospitalization, (Nepro/continue renal vitamin) 6. Cough/intermittent dyspnea: Last CXR 11/5 clear, no edema. I presume aspiration is the concern. On Dysphagia diet. 7. Disposition - Now getting inpt rehab  Jamal Maes, MD [facility] [phone] Pager 10/13/2016, 1:50 PM

## 2016-10-13 NOTE — Progress Notes (Signed)
[facility] PHYSICAL MEDICINE & REHABILITATION     PROGRESS NOTE  Subjective/Complaints:  Pt sitting up in her chair, eating breakfast.  She slept well overnight.  She states she had a good first day of therapies and recently took some cough medicine, so that has improved as well.   ROS: +Cough. Denies CP, SOB, N/V/D.  Objective: Vital Signs: Blood pressure 109/89, pulse 91, temperature 98.2 F (36.8 C), temperature source Oral, resp. rate 16, height 5\' 1"  (1.549 m), weight 57.1 kg (125 lb 14.1 oz), SpO2 98 %. Dg Chest 2 View  Result Date: 10/11/2016 CLINICAL DATA:  Dyspnea and cough for 1 month. EXAM: CHEST  2 VIEW COMPARISON:  10/08/2016, 08/30/2016 FINDINGS: There is stable cardiomegaly. There is subpleural scarring in the left mid lung and left lung base. There may be a small right lower lobe infiltrate. No pneumothorax, effusion or CHF. Status post median sternotomy and mitral valvular repair. Dual-lumen left jugular central line catheter extends into the right atrium, unchanged in appearance. IMPRESSION: Stable cardiomegaly. Faint opacities in the right lower lobe medially which may reflect a small pneumonic consolidation. Atelectasis and/or scarring at the left lung base. Electronically Signed   By: Ashley Royalty M.D.   On: 10/11/2016 18:24   Dg Swallowing Func-speech Pathology  Result Date: 10/11/2016 Objective Swallowing Evaluation: Type of Study: MBS-Modified Barium Swallow Study Patient Details Name: MAKENA MURDOCK MRN: [id number] Date of Birth: December 22, 1955 Today's Date: 10/11/2016 Time: SLP Start Time (ACUTE ONLY): 1055-SLP Stop Time (ACUTE ONLY): 1109 SLP Time Calculation (min) (ACUTE ONLY): 14 min Past Medical History: Past Medical History: Diagnosis Date . Diabetes mellitus  . Hyperlipidemia  . Hypertension  Past Surgical History: Past Surgical History: Procedure Laterality Date . AV FISTULA PLACEMENT Left 10/02/2016  Procedure: INSERTION OF ARTERIOVENOUS (AV) GORE-TEX GRAFT ARM;   Surgeon: Waynetta Sandy, MD;  Location: [place];  Service: Vascular;  Laterality: Left; . CARDIAC CATHETERIZATION N/A 08/15/2016  Procedure: Left Heart Cath and Coronary Angiography;  Surgeon: Yolonda Kida, MD;  Location: [place] [facility];  Service: Cardiovascular;  Laterality: N/A; . CARDIAC CATHETERIZATION N/A 08/15/2016  Procedure: Coronary Stent Intervention;  Surgeon: Yolonda Kida, MD;  Location: [place] [facility];  Service: Cardiovascular;  Laterality: N/A; . CARDIAC CATHETERIZATION N/A 08/18/2016  Procedure: Right Heart Cath;  Surgeon: Jolaine Artist, MD;  Location: [place] [facility];  Service: Cardiovascular;  Laterality: N/A; . CARDIAC CATHETERIZATION N/A 08/18/2016  Procedure: IABP Insertion;  Surgeon: Jolaine Artist, MD;  Location: [facility];  Service: Cardiovascular;  Laterality: N/A; . CARDIAC CATHETERIZATION Right 08/23/2016  Procedure: CENTRAL LINE INSERTION RIGHT SUBCLAVIAN;  Surgeon: Ivin Poot, MD;  Location: [place];  Service: Open Heart Surgery;  Laterality: Right; . ENDOVEIN HARVEST OF GREATER SAPHENOUS VEIN Left 08/23/2016  Procedure: ENDOVEIN HARVEST OF GREATER SAPHENOUS VEIN;  Surgeon: Ivin Poot, MD;  Location: [place] [facility];  Service: Open Heart Surgery;  Laterality: Left; . INSERTION OF DIALYSIS CATHETER Bilateral 08/31/2016  Procedure: INSERTION OF DIALYSIS CATHETER LEFT INTERNAL JUGULAR VEIN & INSERTION OF TRIPLE LUMEN RIGHT INTERNAL JUGULAR VEIN;  Surgeon: Angelia Mould, MD;  Location: [facility];  Service: Vascular;  Laterality: Bilateral; . INTRAOPERATIVE TRANSESOPHAGEAL ECHOCARDIOGRAM N/A 08/23/2016  Procedure: INTRAOPERATIVE TRANSESOPHAGEAL ECHOCARDIOGRAM;  Surgeon: Ivin Poot, MD;  Location: [facility];  Service: Open Heart Surgery;  Laterality: N/A; . MITRAL VALVE REPAIR N/A 08/23/2016  Procedure: MITRAL VALVE REPAIR (MVR) USING 25MM EDWARDS MAGNA EASE BIOPROSTHESIS MITRAL  VALVE;  Surgeon: Ivin Poot, MD;  Location: [place];  Service: Open  Heart Surgery;  Laterality: N/A; . VAGINAL DELIVERY    x 6 HPI: 60 yo female admitted to [facility] 08/15/16 with STEMI s/p cath to [place]. Developed cardiogenic shock, VDRF (ETT 9/14-9/29), AKI with metabolic acidosis and started on CRRT 08/17/16. Transferred to [facility] to tx shock and evaluated new severe MR from papillary muscle rupture. She also had fever and found to have Group B Strep in sputum culture.Mitral valve replacement 9/20. Swallow evaluation 9/30 with recs for NPO given poor mentation, severely impaired swallow function s/p prolonged intubation. Reintubated 9/30 secondary to septic shock; trach 10/5; new onset right hemiplegia and right gaze preference.  CT negative for acute changes; dx acute encephalopathy. MBS on 10/23 and Dys 2 with nectar thick liquids using a chin tuck began. 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 She had a baseline, congested cough throughout MBS that was not associated with aspiration. Mild piecemeal swallowing observed with pureed and regular solids. A delay in swallow initation to the valleculae with reduced laryngeal closure seen for all PO intake. Given thin liquid, pt had a single event of silent aspiration, but when utilizing a chin tuck intermittent flash penetration occurred with no penetrate/aspirate residuals. Following multiple thin liquid trials, pt's strong cough resulted in retrograde backflow of boluses from the esophagus to the level of the valleculae. Retrograde backflow intermittently occurred into the pharynx with more solid trials; however, her airway remained protected throughout these events. Esophageal sweep showed mild build up of  consistencies (MD not present to confirm). Recommend advancement to Dys 3 and thin liquids utilizing a chin tuck for every sip. MD may wish to consider further esophageal assessment given findings on most recent two MBS, which impacts pt's risk for post-prandial aspiration and may be influencing some of her coughing. Will continue to follow acutely for diet tolerance. Impact on safety and function Mild aspiration risk   CHL IP TREATMENT RECOMMENDATION 10/11/2016 Treatment Recommendations Therapy as outlined in treatment plan below   Prognosis 10/11/2016 Prognosis for Safe Diet Advancement Good Barriers to Reach Goals -- Barriers/Prognosis Comment -- CHL IP DIET RECOMMENDATION 10/11/2016 SLP Diet Recommendations Dysphagia 3 (Mech soft) solids;Thin liquid Liquid Administration via Cup;No straw Medication Administration Crushed with puree Compensations Minimize environmental distractions;Slow rate;Small sips/bites;Chin tuck;Follow solids with liquid Postural Changes Remain semi-upright after after feeds/meals (Comment);Seated upright at 90 degrees   CHL IP OTHER RECOMMENDATIONS 10/11/2016 Recommended Consults -- Oral Care Recommendations Oral care BID Other Recommendations --   CHL IP FOLLOW UP RECOMMENDATIONS 10/11/2016 Follow up Recommendations Inpatient Rehab   CHL IP FREQUENCY AND DURATION 10/11/2016 Speech Therapy Frequency (ACUTE ONLY) min 2x/week Treatment Duration 2 weeks      CHL IP ORAL PHASE 10/11/2016 Oral Phase Impaired Oral - Pudding Teaspoon -- Oral - Pudding Cup -- Oral - Honey Teaspoon -- Oral - Honey Cup -- Oral - Nectar Teaspoon -- Oral - Nectar Cup NT Oral - Nectar Straw NT Oral - Thin Teaspoon NT Oral - Thin Cup WFL Oral - Thin Straw -- Oral - Puree Piecemeal swallowing Oral - Mech Soft NT Oral - Regular Piecemeal swallowing Oral - Multi-Consistency NT Oral - Pill NT Oral Phase - Comment --  CHL IP PHARYNGEAL PHASE 10/11/2016 Pharyngeal Phase Impaired  Pharyngeal- Pudding Teaspoon -- Pharyngeal --  Pharyngeal- Pudding Cup -- Pharyngeal -- Pharyngeal- Honey Teaspoon -- Pharyngeal -- Pharyngeal- Honey Cup -- Pharyngeal -- Pharyngeal- Nectar Teaspoon -- Pharyngeal -- Pharyngeal- Nectar Cup NT Pharyngeal -- Pharyngeal- Nectar Straw NT Pharyngeal -- Pharyngeal- Thin Teaspoon NT Pharyngeal -- Pharyngeal- Thin Cup Penetration/Aspiration before swallow;Reduced airway/laryngeal closure;Delayed swallow initiation-vallecula;Compensatory strategies attempted (with notebox) Pharyngeal Material enters airway, passes BELOW cords without attempt by patient to eject out (silent aspiration);Material enters airway, remains ABOVE vocal cords then ejected out Pharyngeal- Thin Straw -- Pharyngeal -- Pharyngeal- Puree Delayed swallow initiation-vallecula;Reduced airway/laryngeal closure Pharyngeal -- Pharyngeal- Mechanical Soft -- Pharyngeal -- Pharyngeal- Regular Delayed swallow initiation-vallecula;Reduced airway/laryngeal closure Pharyngeal -- Pharyngeal- Multi-consistency -- Pharyngeal -- Pharyngeal- Pill NT Pharyngeal -- Pharyngeal Comment --  CHL IP CERVICAL ESOPHAGEAL PHASE 10/11/2016 Cervical Esophageal Phase Impaired Pudding Teaspoon -- Pudding Cup -- Honey Teaspoon -- Honey Cup -- Nectar Teaspoon -- Nectar Cup -- Nectar Straw -- Thin Teaspoon -- Thin Cup Esophageal backflow into the pharynx Thin Straw -- Puree Esophageal backflow into the pharynx Mechanical Soft -- Regular Esophageal backflow into the pharynx Multi-consistency -- Pill -- Cervical Esophageal Comment -- No flowsheet data found. Germain Osgood 10/11/2016, 1:23 PM  Note populated for Jiles Prows, student SLP Germain Osgood, M.A. CCC-SLP [phone]              Recent Labs  10/11/16 0348 10/12/16 0628  WBC 15.1* 15.0*  HGB 10.3* 10.5*  HCT 33.5* 33.2*  PLT 475* 481*    Recent Labs  10/12/16 0628 10/13/16 0538  NA 130* 132*  K 4.2 3.9  CL 94* 95*  GLUCOSE 146* 143*  BUN 32* 12  CREATININE 5.04* 2.74*  CALCIUM 8.5* 8.1*   CBG (last  3)   Recent Labs  10/12/16 1939 10/12/16 2052 10/13/16 0636  GLUCAP 95 125* 151*    Wt Readings from Last 3 Encounters:  10/12/16 57.1 kg (125 lb 14.1 oz)  10/11/16 58.1 kg (128 lb 1.4 oz)  08/18/16 71.2 kg (156 lb 15.5 oz)    Physical Exam:  BP 109/89 (BP Location: Right Arm)   Pulse 91   Temp 98.2 F (36.8 C) (Oral)   Resp 16   Ht 5\' 1"  (1.549 m)   Wt 57.1 kg (125 lb 14.1 oz)   SpO2 98%   BMI 23.79 kg/m  Constitutional:  She appears well-developed and well-nourished. NAD. HENT: Normocephalic and atraumatic.  Eyes: EOM are normal. No discharge.  Cardiovascular: RRR. No JVD. Respiratory: Effort normal. She has no wheezes.  GI: Soft. Bowel sounds are normal. She exhibits no distension.  Musculoskeletal: She exhibits tenderness (LUE-AVG). She exhibits no edema.  Neurological: She is alert and oriented.  Dysphonic, improving Motor: 4/5 proximally, 4+/5 distally Skin: Skin is warm and dry. She is not diaphoretic. No erythema.  Incision healing  Psychiatric: Her affect is blunt. She is slowed, improving.    Assessment/Plan: 1. Functional deficits secondary to debility from multiply cardial and pulmonary issues which require 3+ hours per day of interdisciplinary therapy in a comprehensive inpatient rehab setting. Physiatrist is providing close team supervision and 24 hour management of active medical problems listed below. Physiatrist and rehab team continue to assess barriers to discharge/monitor patient progress toward functional and medical goals.  Function:  Bathing Bathing position      Bathing parts      Bathing assist        Upper Body Dressing/Undressing Upper body dressing  Upper body assist        Lower Body Dressing/Undressing Lower body dressing                                  Lower body assist        Toileting Toileting     Toileting steps completed by helper: Adjust clothing prior to toileting,  Performs perineal hygiene, Adjust clothing after toileting    Toileting assist Assist level: Two helpers   Transfers Chair/bed transfer Chair/bed transfer activity did not occur: Safety/medical concerns Chair/bed transfer method: Stand pivot Chair/bed transfer assist level: Moderate assist (Pt 50 - 74%/lift or lower) Chair/bed transfer assistive device: Armrests     Locomotion Ambulation     Max distance: 20 ft Assist level: Moderate assist (Pt 50 - 74%)   Wheelchair          Cognition Comprehension Comprehension assist level: Understands basic 90% of the time/cues < 10% of the time  Expression Expression assist level: Expresses basic 75 - 89% of the time/requires cueing 10 - 24% of the time. Needs helper to occlude trach/needs to repeat words.  Social Interaction Social Interaction assist level: Interacts appropriately 75 - 89% of the time - Needs redirection for appropriate language or to initiate interaction.  Problem Solving Problem solving assist level: Solves basic 75 - 89% of the time/requires cueing 10 - 24% of the time  Memory Memory assist level: Recognizes or recalls 75 - 89% of the time/requires cueing 10 - 24% of the time    Medical Problem List and Plan: 1.  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  Cont CIR 2.  DVT Prophylaxis/Anticoagulation: Pharmaceutical: Coumadin  INR supra therapeutic 11/10 3. Pain Management: Hydrocodone prn effective. 4. Mood: LCSW to follow for evaluation and support.  5. Neuropsych: This patient is not fully capable of making decisions on her own behalf. 6. Skin/Wound Care: Routine pressure relief measures.  7. Fluids/Electrolytes/Nutrition: Strict I/O. Renal panel TTSa with HD.   D3, thin diet, advance diet as tolerated 8. Leucocytosis:   Cont cipro for bronchitis   WBCs 15.0 on 11/9  Labs with HD 10 T2DM:Hgb A1c- 13.3.   Levemir increased to 7U bid  on 11/10  Will monitor blood sugars ac/hs with SSI for elevated BS  Will cont to monitor 11. ESRD: Now HD dependent--to schedule HD in afternoons to help with tolerance of therapy during the day. Educate patient and family on renal diet. Electrolyte abnormalities being managed during HD.  12. Anemia of chronic disease:   Hb 10.6 on 11/9  Monitor for signs of bleeding.   On aranesp for erythropoiesis and nulecit for iron load  Labs with HD 13. CAD s/p DES X 2 and MVR: Monitor for symptoms with increase in activity. On plavix and Lipitor.  14. Acute respiratory failure due to acute pulmonary edema:  Fluid overload compensated with HD.   CXR 11/8 reviewed, relatively unremarkable ?RLL finding 15. AFlutter s/p DCCV/PAF: HR controlled on amiodarone. On coumadin.  16. Persistent cough with dysphonia: Off antibiotics.   Continue robinul for secretions and Robitussin DM for cough, will consider weaning when appropriate  Encourage IS with use of flutter valve 17. Sleep disturbance: managed with benadryl prn  LOS (Days) 2 A FACE TO FACE EVALUATION WAS PERFORMED  Bernardino Dowell Lorie Phenix 10/13/2016 8:19 AM

## 2016-10-13 NOTE — Progress Notes (Addendum)
      [address]       [place] [address]             [phone]         Subjective: She shares she is doing well. Still coughing.   Objective: Vital signs in last 24 hours: Temp:  [97.7 F (36.5 C)-98.7 F (37.1 C)] 98.2 F (36.8 C) (11/10 0535) Pulse Rate:  [64-98] 91 (11/10 0535) Resp:  [16-18] 16 (11/10 0535) BP: (69-132)/(48-89) 109/89 (11/10 0535) SpO2:  [96 %-99 %] 98 % (11/10 0535) Weight:  [125 lb 14.1 oz (57.1 kg)-128 lb 4.9 oz (58.2 kg)] 125 lb 14.1 oz (57.1 kg) (11/09 2011)     Intake/Output from previous day: 11/09 0701 - 11/10 0700 In: 240 [P.O.:240] Out: 1101 [Emesis/NG output:1] Intake/Output this shift: Total I/O In: 250 [P.O.:240; I.V.:10] Out: -   General appearance: alert, cooperative and no distress Heart: regular rate and rhythm Lungs: clear to auscultation bilaterally Extremities: extremities normal, atraumatic, no cyanosis or edema Wound: clean and dry  Lab Results:  Recent Labs  10/11/16 0348 10/12/16 0628  WBC 15.1* 15.0*  HGB 10.3* 10.5*  HCT 33.5* 33.2*  PLT 475* 481*   BMET:  Recent Labs  10/12/16 0628 10/13/16 0538  NA 130* 132*  K 4.2 3.9  CL 94* 95*  CO2 23 24  GLUCOSE 146* 143*  BUN 32* 12  CREATININE 5.04* 2.74*  CALCIUM 8.5* 8.1*    PT/INR:  Recent Labs  10/13/16 0538  LABPROT 34.9*  INR 3.37   ABG    Component Value Date/Time   PHART 7.487 (H) 10/04/2016 1430   HCO3 28.4 (H) 10/04/2016 1430   TCO2 26 09/08/2016 0412   ACIDBASEDEF 4.1 (H) 09/02/2016 1850   O2SAT 95.5 10/04/2016 1430   CBG (last 3)   Recent Labs  10/12/16 2052 10/13/16 0636 10/13/16 1139  GLUCAP 125* 151* 248*    Assessment/Plan:  1. CV- NSR 90s with good BP 2. Pulm- continues to use IS. On Ra. No issues 3. Renal-on Dialysis, creatinine 2.74. 4. Endocrine-good overall control 5. Anticoagulation- continue coumadin. INR 3.37 today. Dosing per pharmacy. Also on Plavix. No asa. H and H stable.  6.  ID-WBC stable at 15.0.   Plan: Continue inpatient rehab for increased mobilization and strength. Medical management per attending.     LOS: 2 days    Elgie Collard 10/13/2016  Making progress on rehabilitation unit and benefiting from multiple rehabilitation therapies patient examined and medical record reviewed,agree with above note. Tharon Aquas Trigt III 10/13/2016

## 2016-10-14 ENCOUNTER — Inpatient Hospital Stay (HOSPITAL_COMMUNITY): Payer: Self-pay | Admitting: Occupational Therapy

## 2016-10-14 ENCOUNTER — Inpatient Hospital Stay (HOSPITAL_COMMUNITY): Payer: Self-pay | Admitting: Physical Therapy

## 2016-10-14 ENCOUNTER — Inpatient Hospital Stay (HOSPITAL_COMMUNITY): Payer: Medicaid Other | Admitting: Speech Pathology

## 2016-10-14 LAB — RENAL FUNCTION PANEL
Albumin: 2.7 g/dL — ABNORMAL LOW (ref 3.5–5.0)
Anion gap: 14 (ref 5–15)
BUN: 25 mg/dL — AB (ref 6–20)
CALCIUM: 8.5 mg/dL — AB (ref 8.9–10.3)
CHLORIDE: 94 mmol/L — AB (ref 101–111)
CO2: 24 mmol/L (ref 22–32)
CREATININE: 4.41 mg/dL — AB (ref 0.44–1.00)
GFR calc Af Amer: 12 mL/min — ABNORMAL LOW (ref >60)
GFR, EST NON AFRICAN AMERICAN: 10 mL/min — AB (ref >60)
Glucose, Bld: 212 mg/dL — ABNORMAL HIGH (ref 65–99)
Phosphorus: 8.7 mg/dL — ABNORMAL HIGH (ref 2.5–4.6)
Potassium: 4 mmol/L (ref 3.5–5.1)
SODIUM: 132 mmol/L — AB (ref 135–145)

## 2016-10-14 LAB — GLUCOSE, CAPILLARY
GLUCOSE-CAPILLARY: 210 mg/dL — AB (ref 65–99)
GLUCOSE-CAPILLARY: 172 mg/dL — AB (ref 65–99)
Glucose-Capillary: 192 mg/dL — ABNORMAL HIGH (ref 65–99)
Glucose-Capillary: 305 mg/dL — ABNORMAL HIGH (ref 65–99)
Glucose-Capillary: 126 mg/dL — ABNORMAL HIGH (ref 65–99)

## 2016-10-14 LAB — PROTIME-INR
INR: 4.12
PROTHROMBIN TIME: 41 s — AB (ref 11.4–15.2)

## 2016-10-14 MED ORDER — ALPRAZOLAM 0.25 MG PO TABS
0.2500 mg | ORAL_TABLET | Freq: Two times a day (BID) | ORAL | Status: DC | PRN
  Administered 2016-10-15 – 2016-10-31 (×14): 0.25 mg via ORAL
  Filled 2016-10-14 (×14): qty 1

## 2016-10-14 NOTE — Progress Notes (Signed)
Patient ID: Deborah [name], female   DOB: 23-Aug-1956, 60 y.o.   MRN: [id number]  [facility] Progress Note   Subjective:    Doing therapies Still with cough For HD today Son in with her today    Objective:   BP (!) 105/53 (BP Location: Left Arm) Comment: notify nurse  Pulse 91   Temp 97.8 F (36.6 C) (Oral)   Resp 16   Ht 5\' 1"  (1.549 m)   Wt 57.1 kg (125 lb 14.1 oz)   SpO2 100%   BMI 23.79 kg/m   Physical Exam: VS as noted NAD Prev trach site closed Chest is clear anteriorly, but with cough, stridorous sound to insp S1S2 No S3 Healed sternotomy scar Abd soft and not tender No edema of LE's Left upper arm AVG + bruit (10/30) Left sided TDC (9/28 VVS)   Recent Labs Lab 10/09/16 0256 10/10/16 0356 10/11/16 0348 10/11/16 1720 10/12/16 0628 10/13/16 0538 10/14/16 0411  NA 134* 133* 133* 129* 130* 132* 132*  K 4.1 4.0 3.4* 4.6 4.2 3.9 4.0  CL 94* 91* 94* 93* 94* 95* 94*  CO2 24 24 26 23 23 24 24   GLUCOSE 150* 132* 53* 195* 146* 143* 212*  BUN 60* 82* 22* 27* 32* 12 25*  CREATININE 4.59* 5.88* 3.44* 4.20* 5.04* 2.74* 4.41*  CALCIUM 9.1 9.1 8.5* 8.4* 8.5* 8.1* 8.5*  PHOS  --  7.2* 5.3*  --  7.5* 5.9* 8.7*   Iron/TIBC/Ferritin/ %Sat    Component Value Date/Time   IRON 22 (L) 10/10/2016 0356   TIBC 326 10/10/2016 0356   FERRITIN 272 10/10/2016 0356   IRONPCTSAT 7 (L) 10/10/2016 0356   Results for Deborah [name], Deborah [name] (MRN [id number]) as of 10/12/2016 08:46  Ref. Range 10/10/2016 03:56  PTH Latest Ref Range: 15 - 65 pg/mL 199 (H)    Recent Labs Lab 10/10/16 0356 10/11/16 0348 10/12/16 0628  WBC 18.7* 15.1* 15.0*  NEUTROABS  --   --  11.1*  HGB 11.1* 10.3* 10.5*  HCT 34.6* 33.5* 33.2*  MCV 86.7 87.9 86.7  PLT 529* 475* 481*    Lab Results  Component Value Date   INR 4.12 (HH) 10/14/2016   INR 3.37 10/13/2016   INR 3.65 10/12/2016    Medications:    . amiodarone  200 mg Oral BID  . atorvastatin  20 mg Oral q1800  . benzonatate   100 mg Oral TID  . clopidogrel  75 mg Oral Daily  . [START ON 10/17/2016] darbepoetin (ARANESP) injection - DIALYSIS  100 mcg Intravenous Q Tue-HD  . famotidine  20 mg Oral Daily  . feeding supplement  1 Container Oral TID BM  . feeding supplement (PRO-STAT SUGAR FREE 64)  30 mL Oral TID  . ferric gluconate (FERRLECIT/NULECIT) IV  250 mg Intravenous Q T,Th,Sa-HD  . Gerhardt's butt cream   Topical BID  . glycopyrrolate  1 mg Per Tube BID  . guaiFENesin  30 mL Per Tube Q6H  . hydrocortisone   Rectal BID  . insulin aspart  0-5 Units Subcutaneous QHS  . insulin aspart  0-9 Units Subcutaneous TID WC  . insulin detemir  7 Units Subcutaneous BID  . lanthanum  1,000 mg Oral TID WC  . metoCLOPramide  10 mg Oral TID AC  . midodrine  10 mg Oral q morning - 10a  . multivitamin  1 tablet Oral QHS  . multivitamin  15 mL Per Tube Daily  . pantoprazole  20 mg Oral  BID  . sodium chloride flush  10-40 mL Intracatheter Q12H  . Warfarin - Pharmacist Dosing Inpatient   Does not apply q1800   Background: 60 y/o AAF with DM2, HTN, HLD presented to [facility] ER with infposterior STEMI on 9/12->cath->. PCS with stent x 2 by Dr. Clayborn Bigness. EF 55-60% with severe MR.  Post-cath respiratory failure ->intubation. Shock and renal failure (creatinine 1.6 on adm). Trialysis catheter placed 9/14 and pt had CRRT at [facility] for 48 hours prior to transfer here on 08/18/16->back to cath lab -> ruptured posterior papillary muscle with severe MR->IABP/[place]. We were asked to see d/t progressive rise in creatinine/volume overload/anuric AKI .  CRRT re-initiated 08/21/16-9/20, then 9/21 post MVR. Transitioned to IHD.  Dialysis dependent since 08/17/16. No recovery. New ESRD.  Assessment/ Plan:    1. 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 Amio. Coumadin. INR supratherapeutic. Pharmacy managing. Using midodrine for BP.  2. 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  TTS schedule via  Left TDC (9/28 Scot Dock). S/P LUA AVG placed on 10/30 (brachioaxillary) by Dr. Donzetta Matters - will be ready by Nov 30. EDW looks to be about 57 kg. (outpt has spot [facility] TTS)  HD today after rehab 3. Anemia: Secondary to anemia of ESRD/Fe def - on darbe 100 QTuesday. TSat 7%. Fe load with HD. Last Hb 10.5. 4. CKD-MBD: Phos high. Started lanthanum. Phos has been all over the place. Suspect not getting the binder with her food. Increase to 2 gm ac. PTH 199 no intervention needed yet. 2Ca bath with HD 5. Nutrition: Low albumin secondary to critical illness/prolonged hospitalization, (Nepro/continue renal vitamin) 6. Cough/intermittent dyspnea: Last CXR 11/5 clear, no edema. I presume aspiration is the concern. On Dysphagia diet. Resp is stridorous but lungs o/w clear 7. Disposition - Cont rehab. Eventual home, HD at [place], [facility] [phone] Pager 10/14/2016, 10:53 AM

## 2016-10-14 NOTE — Progress Notes (Signed)
Physical Therapy Session Note  Patient Details  Name: Deborah [name] MRN: [id number] Date of Birth: March 20, 1956  Today's Date: 10/14/2016 PT Individual Time: 3343-5686 PT Individual Time Calculation (min): 55 min    Short Term Goals: Week 1:  PT Short Term Goal 1 (Week 1): Patient will perform transfers with min A.  PT Short Term Goal 2 (Week 1): Patient will ambulate 75 ft with min A.  PT Short Term Goal 3 (Week 1): Patient will maintain dynamic standing balance x 1 min with min A.  PT Short Term Goal 4 (Week 1): Patient will negotiate up/down 4 stairs using L rail only with min A.  PT Short Term Goal 5 (Week 1): Patient will propel wheelchair x 150 ft with supervision for strengthening.   Skilled Therapeutic Interventions/Progress Updates:   Pt presented in chair agreeable for therapy. Propelled w/c frombed side to gym with minA for endurance. Performed 3" stairs ascend/decend x8 with 2 rails. Pt instructed in safely performing sit to/from stand transfers with safe hand placement. Pt transferred w/c/mat/chair x 2 each with breaks required 2/2 coughing. Pt able to consistently perform transfers with CGA and RW.  Pt performed standing ball toss x3 trials lasting 30sec and 2x1 min respectively.  Pt able to maintain F/F+ balance with no AD and CGA.  Pt able to occasionally reach outside BOS with no LOB noted.  After standing trials pt requesting to return to bed 2/2 fatigue. Pt encouraged to sit in chair and agreeable to perform seated therex in room.  Pt performed LAQ, hip flexion x 15 bilaterally before requesting again to return to bed. Pt performed stand pivot transfer with RW and CGA and able to performed sit to supine with HOB elevated and bed rails with supervision. Pt able to perform additional SLR x 10, hip abd x 10, SAQ x 10, heel slides x10 each bilaterally.  Pt unable to complete any further activities 2/2 fatigue. Pt left in room with son present and all needs met.   Therapy  Documentation Precautions:  Precautions Precautions: Fall Restrictions Weight Bearing Restrictions: Yes (sternal precautions) General:   Vital Signs: Therapy Vitals Temp: 98.3 F (36.8 C) Temp Source: Oral Pulse Rate: 95 Resp: 16 BP: 122/72 Patient Position (if appropriate): Sitting Oxygen Therapy SpO2: 100 % O2 Device: Not Delivered Pain:   Mobility:   Locomotion :    Trunk/Postural Assessment :    Balance:   Exercises:   Other Treatments:     See Function Navigator for Current Functional Status.   Therapy/Group: Individual Therapy  Ishan Sanroman  Leotha Voeltz, PTA  10/14/2016, 2:01 PM

## 2016-10-14 NOTE — Progress Notes (Signed)
[facility] PHYSICAL MEDICINE & REHABILITATION     PROGRESS NOTE  Subjective/Complaints:  No issues overnitem appetite reported as ok  ROS: +Cough. Denies CP, SOB, N/V/D.  Objective: Vital Signs: Blood pressure (!) 105/53, pulse 91, temperature 97.8 F (36.6 C), temperature source Oral, resp. rate 16, height 5\' 1"  (1.549 m), weight 57.1 kg (125 lb 14.1 oz), SpO2 100 %. No results found.  Recent Labs  10/12/16 0628  WBC 15.0*  HGB 10.5*  HCT 33.2*  PLT 481*    Recent Labs  10/13/16 0538 10/14/16 0411  NA 132* 132*  K 3.9 4.0  CL 95* 94*  GLUCOSE 143* 212*  BUN 12 25*  CREATININE 2.74* 4.41*  CALCIUM 8.1* 8.5*   CBG (last 3)   Recent Labs  10/13/16 1639 10/13/16 2100 10/14/16 0631  GLUCAP 192* 189* 192*    Wt Readings from Last 3 Encounters:  10/12/16 57.1 kg (125 lb 14.1 oz)  10/11/16 58.1 kg (128 lb 1.4 oz)  08/18/16 71.2 kg (156 lb 15.5 oz)    Physical Exam:  BP (!) 105/53 (BP Location: Left Arm) Comment: notify nurse  Pulse 91   Temp 97.8 F (36.6 C) (Oral)   Resp 16   Ht 5\' 1"  (1.549 m)   Wt 57.1 kg (125 lb 14.1 oz)   SpO2 100%   BMI 23.79 kg/m  Constitutional:  She appears well-developed and well-nourished. NAD. HENT: Normocephalic and atraumatic.  Eyes: EOM are normal. No discharge.  Cardiovascular: RRR. No JVD. Respiratory: Effort normal. She has no wheezes.  GI: Soft. Bowel sounds are normal. She exhibits no distension.  Musculoskeletal: She exhibits tenderness (LUE-AVG). She exhibits no edema.  Neurological: She is alert and oriented.  Dysphonic, improving Motor: 4/5 proximally, 4+/5 distally Skin: Skin is warm and dry. She is not diaphoretic. No erythema.  Incision healing  Psychiatric: Her affect is blunt. She is slowed, improving.    Assessment/Plan: 1. Functional deficits secondary to debility from multiply cardial and pulmonary issues which require 3+ hours per day of interdisciplinary therapy in a comprehensive inpatient rehab  setting. Physiatrist is providing close team supervision and 24 hour management of active medical problems listed below. Physiatrist and rehab team continue to assess barriers to discharge/monitor patient progress toward functional and medical goals.  Function:  Bathing Bathing position      Bathing parts      Bathing assist        Upper Body Dressing/Undressing Upper body dressing                    Upper body assist        Lower Body Dressing/Undressing Lower body dressing                                  Lower body assist        Toileting Toileting Toileting activity did not occur: No continent bowel/bladder event   Toileting steps completed by helper: Adjust clothing prior to toileting, Performs perineal hygiene, Adjust clothing after toileting    Toileting assist Assist level: Two helpers   Transfers Chair/bed transfer Chair/bed transfer activity did not occur: Safety/medical concerns Chair/bed transfer method: Stand pivot Chair/bed transfer assist level: Moderate assist (Pt 50 - 74%/lift or lower) Chair/bed transfer assistive device: Armrests     Locomotion Ambulation     Max distance: 80 ft Assist level: Moderate assist (Pt 50 - 74%)  Wheelchair   Type: Manual Max wheelchair distance: 100 ft Assist Level: Supervision or verbal cues  Cognition Comprehension Comprehension assist level: Understands basic 90% of the time/cues < 10% of the time  Expression Expression assist level: Expresses basic 75 - 89% of the time/requires cueing 10 - 24% of the time. Needs helper to occlude trach/needs to repeat words.  Social Interaction Social Interaction assist level: Interacts appropriately 90% of the time - Needs monitoring or encouragement for participation or interaction.  Problem Solving Problem solving assist level: Solves basic 75 - 89% of the time/requires cueing 10 - 24% of the time  Memory Memory assist level: Recognizes or recalls 75 -  89% of the time/requires cueing 10 - 24% of the time    Medical Problem List and Plan: 1.  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  Cont CIR 2.  DVT Prophylaxis/Anticoagulation: Pharmaceutical: Coumadin  INR supra therapeutic 11/11,11/10, hold again, no evidence of hemorrhage 3. Pain Management: Hydrocodone prn effective. 4. Mood: LCSW to follow for evaluation and support.  5. Neuropsych: This patient is not fully capable of making decisions on her own behalf. 6. Skin/Wound Care: Routine pressure relief measures.  7. Fluids/Electrolytes/Nutrition: Strict I/O. Renal panel TTSa with HD.   D3, thin diet, advance diet as tolerated 8. Leucocytosis:   Cont cipro for bronchitis   WBCs 15.0 on 11/9  Labs with HD 10 T2DM:Hgb A1c- 13.3.   Levemir increased to 7U bid on 11/10  Will monitor blood sugars ac/hs with SSI for elevated BS   CBG (last 3)   Recent Labs  10/13/16 1639 10/13/16 2100 10/14/16 0631  GLUCAP 192* 189* 192*    11. ESRD: Now HD dependent--to schedule HD in afternoons to help with tolerance of therapy during the day. Educate patient and family on renal diet. Electrolyte abnormalities being managed during HD.  12. Anemia of chronic disease:   Hb 10.6 on 11/9  Monitor for signs of bleeding.   On aranesp for erythropoiesis and nulecit for iron load  Labs with HD 13. CAD s/p DES X 2 and MVR: Monitor for symptoms with increase in activity. On plavix and Lipitor.  14. Acute respiratory failure due to acute pulmonary edema:  Fluid overload compensated with HD.   No peripheral edema lungs have mild stridor otherwise no resp distress 15. AFlutter s/p DCCV/PAF: HR controlled on amiodarone. On coumadin.  16. Persistent cough with dysphonia: Off antibiotics.   Continue robinul for secretions and Robitussin DM for cough, will consider weaning when appropriate  Encourage IS with use of flutter  valve 17. Sleep disturbance: managed with benadryl prn  LOS (Days) 3 A FACE TO FACE EVALUATION WAS PERFORMED  Charlett Blake 10/14/2016 6:54 AM

## 2016-10-14 NOTE — Progress Notes (Signed)
Occupational Therapy Note  Patient Details  Name: Deborah [name] MRN: [id number] Date of Birth: January 01, 1956  Today's Date: 10/14/2016 OT Missed Time: 63 Minutes Missed Time Reason: Patient fatigue  Pt recently returned from PT session and asleep upon OT arrival. Per pt's son, pt is very tired and just laid down to rest. Declines participation in OT at this time. OT to follow up.   Daneen Schick Canio Winokur 10/14/2016, 2:36 PM

## 2016-10-14 NOTE — Progress Notes (Signed)
ANTICOAGULATION CONSULT NOTE - Follow Up Consult  Pharmacy Consult for coumadin Indication: a. flutter  No Known Allergies  Patient Measurements: Height: 5\' 1"  (154.9 cm) Weight: 125 lb 14.1 oz (57.1 kg) IBW/kg (Calculated) : 47.8  Vital Signs: Temp: 97.8 F (36.6 C) (11/11 0521) Temp Source: Oral (11/11 0521) BP: 105/53 (11/11 0521) Pulse Rate: 91 (11/11 0521)  Labs:  Recent Labs  10/12/16 0628 10/12/16 0906 10/13/16 0538 10/14/16 0411  HGB 10.5*  --   --   --   HCT 33.2*  --   --   --   PLT 481*  --   --   --   LABPROT  --  37.2* 34.9* 41.0*  INR  --  3.65 3.37 4.12*  CREATININE 5.04*  --  2.74* 4.41*    Estimated Creatinine Clearance: 10.4 mL/min (by C-G formula based on SCr of 4.41 mg/dL (H)).  Assessment: 60 yo female with a flutter is currently on suprtherapeutic coumadin.  INR today is 4.12. Held coumadin since 11/8.   Goal of Therapy:  INR 2 - 2.5 per MD Monitor platelets by anticoagulation protocol: Yes   Plan:  - No coumadin tonight - Daily PT/INR  Myer Peer Grayland Ormond), PharmD  PGY1 Pharmacy Resident Pager: [phone] 10/14/2016 7:45 AM

## 2016-10-14 NOTE — Progress Notes (Signed)
CRITICAL VALUE ALERT  Critical value received:  INR 4.12  Date of notification: 10/14/2016  Time of notification:  04:45 AM  Critical value read back: yes  Nurse who received alert:  Annita Brod  MD notified (1st page):  Kirsteins  Time of first page:  04:56 AM  MD notified (2nd page):  Time of second page:  Responding MD:  Letta Pate  Time MD responded: 04:56 AM

## 2016-10-14 NOTE — Progress Notes (Signed)
Occupational Therapy Session Note  Patient Details  Name: Deborah [name] MRN: [id number] Date of Birth: [birth year]  Today's Date: 10/14/2016 OT Individual Time: 1000-1100 OT Individual Time Calculation (min): 60 min   Short Term Goals: Week 1:  OT Short Term Goal 1 (Week 1): Pt will tolerate 5 minutes standing at the sink to complete ADL tasks  with close supervision.  OT Short Term Goal 2 (Week 1): Pt will complete LB dressing with Min A OT Short Term Goal 3 (Week 1): Pt will complete toilet transfer with overall Min A OT Short Term Goal 4 (Week 1): Pt will follow 75% commands with min questioning cues.  Skilled Therapeutic Interventions/Progress Updates:    1:1 OT session focused on activity tolerance, sit<>stands, functional transfers, and modified bathing/dressing. Pt completed bathing at the sink with Min A sit<>stands and Min A to maintain standing balance. Pt reported need for bathroom and transferred onto toilet with Mod A via stand-pivot transfer. Pt with successful Bm, dependent toileting completed. Worked on standing endurance at the sink to stand for oral care. Pt tolerated ~1 minute in standing and had to sit to finish grooming task 2/2 fatigue. Pt left seated in wc at end of session and left with needs met.   Therapy Documentation Precautions:  Precautions Precautions: Fall Restrictions Weight Bearing Restrictions: Yes (sternal precautions) Pain:  none/denies pain ADL: ADL ADL Comments: Please see functional navigator Exercises:   Other Treatments:    See Function Navigator for Current Functional Status.   Therapy/Group: Individual Therapy  Valma Cava 10/14/2016, 2:35 PM

## 2016-10-14 NOTE — Progress Notes (Signed)
Speech Language Pathology Daily Session Note  Patient Details  Name: Deborah [name] MRN: [id number] Date of Birth: 03/16/1956  Today's Date: 10/14/2016 SLP Individual Time: 1003-1030 SLP Individual Time Calculation (min): 27 min   Short Term Goals: Week 1: SLP Short Term Goal 1 (Week 1): Pt will selectively attend to tasks in a mildly distracting environment for 5-7 minutes with supervision verbal cues for redirection SLP Short Term Goal 2 (Week 1): Pt will complete basic familiar tasks with supervision verbal cues for functional problem solving.  SLP Short Term Goal 3 (Week 1): Pt will recall new information with supervision verbal cues for use of external aids.  SLP Short Term Goal 4 (Week 1): Pt will return demonstration of vocal hygiene techniques with min assist verbal cues.   SLP Short Term Goal 5 (Week 1): Pt will observe swallow precautions with PO intake at mod I level.  SLP Short Term Goal 6 (Week 1): Pt will tolerate trials of regular with oral management WFL at mod I.  Skilled Therapeutic Interventions: Skilled treatment session focused on cognitive goals. Upon arrival, patient was awake while supine in bed and appeared lethargic. SLP facilitated session by providing overall Max A verbal cues for recall of the functions of her current medications despite patient verbalizing she  knew the functions of her medications prior to starting task. Patient educated on importance of utilizing a QID pill box to maximize recall and safety with medications. She verbalized understanding and agreement. Pill box organization will be addressed at next session. Patient left supine in bed with all needs within reach. Continue with current plan of care.   Function:  Cognition Comprehension Comprehension assist level: Understands basic 90% of the time/cues < 10% of the time  Expression   Expression assist level: Expresses basic 75 - 89% of the time/requires cueing 10 - 24% of the time. Needs helper  to occlude trach/needs to repeat words.  Social Interaction Social Interaction assist level: Interacts appropriately 90% of the time - Needs monitoring or encouragement for participation or interaction.  Problem Solving Problem solving assist level: Solves basic 75 - 89% of the time/requires cueing 10 - 24% of the time  Memory Memory assist level: Recognizes or recalls 50 - 74% of the time/requires cueing 25 - 49% of the time    Pain Pain Assessment Pain Assessment: No/denies pain  Therapy/Group: Individual Therapy  Majid Mccravy 10/14/2016, 12:29 PM

## 2016-10-15 ENCOUNTER — Inpatient Hospital Stay (HOSPITAL_COMMUNITY): Payer: Medicaid Other | Admitting: Physical Therapy

## 2016-10-15 ENCOUNTER — Inpatient Hospital Stay (HOSPITAL_COMMUNITY): Payer: Medicaid Other

## 2016-10-15 LAB — RENAL FUNCTION PANEL
ALBUMIN: 3.1 g/dL — AB (ref 3.5–5.0)
Albumin: 2.7 g/dL — ABNORMAL LOW (ref 3.5–5.0)
Anion gap: 15 (ref 5–15)
Anion gap: 16 — ABNORMAL HIGH (ref 5–15)
BUN: 34 mg/dL — AB (ref 6–20)
BUN: 5 mg/dL — AB (ref 6–20)
CALCIUM: 8.5 mg/dL — AB (ref 8.9–10.3)
CHLORIDE: 95 mmol/L — AB (ref 101–111)
CO2: 25 mmol/L (ref 22–32)
CO2: 23 mmol/L (ref 22–32)
CREATININE: 5.76 mg/dL — AB (ref 0.44–1.00)
CREATININE: 2.05 mg/dL — AB (ref 0.44–1.00)
Calcium: 8.5 mg/dL — ABNORMAL LOW (ref 8.9–10.3)
Chloride: 95 mmol/L — ABNORMAL LOW (ref 101–111)
GFR calc Af Amer: 8 mL/min — ABNORMAL LOW (ref >60)
GFR, EST AFRICAN AMERICAN: 29 mL/min — AB (ref >60)
GFR, EST NON AFRICAN AMERICAN: 7 mL/min — AB (ref >60)
GFR, EST NON AFRICAN AMERICAN: 25 mL/min — AB (ref >60)
Glucose, Bld: 93 mg/dL (ref 65–99)
Glucose, Bld: 134 mg/dL — ABNORMAL HIGH (ref 65–99)
PHOSPHORUS: 3.1 mg/dL (ref 2.5–4.6)
POTASSIUM: 3.6 mmol/L (ref 3.5–5.1)
Phosphorus: 9.2 mg/dL — ABNORMAL HIGH (ref 2.5–4.6)
Potassium: 3.8 mmol/L (ref 3.5–5.1)
SODIUM: 134 mmol/L — AB (ref 135–145)
Sodium: 135 mmol/L (ref 135–145)

## 2016-10-15 LAB — PROTIME-INR
INR: 3.29
PROTHROMBIN TIME: 34.2 s — AB (ref 11.4–15.2)

## 2016-10-15 LAB — GLUCOSE, CAPILLARY
GLUCOSE-CAPILLARY: 111 mg/dL — AB (ref 65–99)
Glucose-Capillary: 224 mg/dL — ABNORMAL HIGH (ref 65–99)
Glucose-Capillary: 164 mg/dL — ABNORMAL HIGH (ref 65–99)
Glucose-Capillary: 130 mg/dL — ABNORMAL HIGH (ref 65–99)

## 2016-10-15 MED ORDER — GUAIFENESIN 100 MG/5ML PO SOLN
600.0000 mg | Freq: Four times a day (QID) | ORAL | Status: DC
  Administered 2016-10-15 – 2016-10-31 (×55): 600 mg via ORAL
  Administered 2016-10-31: 300 mg via ORAL
  Administered 2016-10-31 – 2016-11-01 (×4): 600 mg via ORAL
  Filled 2016-10-15 (×5): qty 30
  Filled 2016-10-15: qty 15
  Filled 2016-10-15: qty 30
  Filled 2016-10-15: qty 15
  Filled 2016-10-15 (×4): qty 30
  Filled 2016-10-15: qty 15
  Filled 2016-10-15 (×11): qty 30
  Filled 2016-10-15 (×2): qty 15
  Filled 2016-10-15: qty 30
  Filled 2016-10-15: qty 15
  Filled 2016-10-15 (×3): qty 30
  Filled 2016-10-15: qty 15
  Filled 2016-10-15 (×3): qty 30
  Filled 2016-10-15: qty 15
  Filled 2016-10-15 (×2): qty 30
  Filled 2016-10-15 (×2): qty 15
  Filled 2016-10-15 (×6): qty 30
  Filled 2016-10-15: qty 15
  Filled 2016-10-15 (×3): qty 30
  Filled 2016-10-15: qty 15
  Filled 2016-10-15 (×2): qty 30
  Filled 2016-10-15: qty 15
  Filled 2016-10-15 (×11): qty 30

## 2016-10-15 NOTE — Progress Notes (Signed)
[facility] PHYSICAL MEDICINE & REHABILITATION     PROGRESS NOTE  Subjective/Complaints:  Still coughing productive of whitish sputum.  Appreciate Nephro and CVTS notes  ROS: +Cough. Denies CP, SOB, N/V/D.  Objective: Vital Signs: Blood pressure (!) 108/52, pulse 100, temperature 98.9 F (37.2 C), temperature source Oral, resp. rate 20, height 5\' 1"  (1.549 m), weight 56.7 kg (125 lb), SpO2 98 %. No results found. No results for input(s): WBC, HGB, HCT, PLT in the last 72 hours.  Recent Labs  10/14/16 0411 10/15/16 0319  NA 132* 135  K 4.0 3.6  CL 94* 95*  GLUCOSE 212* 93  BUN 25* 34*  CREATININE 4.41* 5.76*  CALCIUM 8.5* 8.5*   CBG (last 3)   Recent Labs  10/14/16 1642 10/14/16 2035 10/15/16 0635  GLUCAP 172* 126* 111*    Wt Readings from Last 3 Encounters:  10/15/16 56.7 kg (125 lb)  10/11/16 58.1 kg (128 lb 1.4 oz)  08/18/16 71.2 kg (156 lb 15.5 oz)    Physical Exam:  BP (!) 108/52 (BP Location: Right Arm)   Pulse 100   Temp 98.9 F (37.2 C) (Oral)   Resp 20   Ht 5\' 1"  (1.549 m)   Wt 56.7 kg (125 lb)   SpO2 98%   BMI 23.62 kg/m  Constitutional:  She appears well-developed and well-nourished. NAD. HENT: Normocephalic and atraumatic.  Eyes: EOM are normal. No discharge.  Cardiovascular: RRR. No JVD. Respiratory: Effort normal. She has no wheezes.decreased BS R>L base  GI: Soft. Bowel sounds are normal. She exhibits no distension.  Musculoskeletal: She exhibits tenderness (LUE-AVG). She exhibits no edema.  Neurological: She is alert and oriented.  Dysphonic, improving Motor: 4/5 proximally, 4+/5 distally Skin: Skin is warm and dry. She is not diaphoretic. No erythema.  Incision healing  Psychiatric: Her affect is blunt. She is slowed, improving.    Assessment/Plan: 1. Functional deficits secondary to debility from multiply cardial and pulmonary issues which require 3+ hours per day of interdisciplinary therapy in a comprehensive inpatient rehab  setting. Physiatrist is providing close team supervision and 24 hour management of active medical problems listed below. Physiatrist and rehab team continue to assess barriers to discharge/monitor patient progress toward functional and medical goals.  Function:  Bathing Bathing position   Position: Wheelchair/chair at sink  Bathing parts Body parts bathed by patient: Right arm, Left arm, Chest, Abdomen, Right upper leg, Left upper leg, Front perineal area Body parts bathed by helper: Left lower leg, Right lower leg, Buttocks  Bathing assist Assist Level: Touching or steadying assistance(Pt > 75%)      Upper Body Dressing/Undressing Upper body dressing   What is the patient wearing?: Pull over shirt/dress     Pull over shirt/dress - Perfomed by patient: Thread/unthread right sleeve, Thread/unthread left sleeve Pull over shirt/dress - Perfomed by helper: Put head through opening, Pull shirt over trunk        Upper body assist        Lower Body Dressing/Undressing Lower body dressing   What is the patient wearing?: Pants, Non-skid slipper socks     Pants- Performed by patient: Pull pants up/down Pants- Performed by helper: Thread/unthread right pants leg, Thread/unthread left pants leg   Non-skid slipper socks- Performed by helper: Don/doff left sock, Don/doff right sock                  Lower body assist Assist for lower body dressing: Touching or steadying assistance (Pt > 75%)  Toileting Toileting Toileting activity did not occur: No continent bowel/bladder event   Toileting steps completed by helper: Performs perineal hygiene, Adjust clothing prior to toileting, Adjust clothing after toileting    Toileting assist Assist level: Touching or steadying assistance (Pt.75%)   Transfers Chair/bed transfer Chair/bed transfer activity did not occur: Safety/medical concerns Chair/bed transfer method: Stand pivot Chair/bed transfer assist level: Moderate assist (Pt  50 - 74%/lift or lower) Chair/bed transfer assistive device: Armrests     Locomotion Ambulation     Max distance: 80 ft Assist level: Moderate assist (Pt 50 - 74%)   Wheelchair   Type: Manual Max wheelchair distance: 100 ft Assist Level: Supervision or verbal cues  Cognition Comprehension Comprehension assist level: Understands basic 90% of the time/cues < 10% of the time  Expression Expression assist level: Expresses basic 75 - 89% of the time/requires cueing 10 - 24% of the time. Needs helper to occlude trach/needs to repeat words.  Social Interaction Social Interaction assist level: Interacts appropriately 90% of the time - Needs monitoring or encouragement for participation or interaction.  Problem Solving Problem solving assist level: Solves basic 75 - 89% of the time/requires cueing 10 - 24% of the time  Memory Memory assist level: Recognizes or recalls 50 - 74% of the time/requires cueing 25 - 49% of the time    Medical Problem List and Plan: 1.  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  Cont CIR PT,OT,SLP 2.  DVT Prophylaxis/Anticoagulation: Pharmaceutical: Coumadin  INR supra therapeutic 11/11,11/10, hold again, per Pharmacy 3. Pain Management: Hydrocodone prn effective. 4. Mood: LCSW to follow for evaluation and support.  5. Neuropsych: This patient is not fully capable of making decisions on her own behalf. 6. Skin/Wound Care: Routine pressure relief measures.  7. Fluids/Electrolytes/Nutrition: Strict I/O. Renal panel TTSa with HD.   D3, thin diet, advance diet as tolerated 8. Leucocytosis:   Cont cipro for bronchitis   WBCs 15.0 on 11/9  Labs with HD 10 T2DM:Hgb A1c- 13.3.   Levemir increased to 7U bid on 11/10  Will monitor blood sugars ac/hs with SSI for elevated BS   CBG (last 3)   Recent Labs  10/14/16 1642 10/14/16 2035 10/15/16 0635  GLUCAP 172* 126* 111*    11. ESRD:  Now HD dependent--to schedule HD in afternoons to help with tolerance of therapy during the day. Educate patient and family on renal diet. Electrolyte abnormalities being managed during HD.  12. Anemia of chronic disease:   Hb 10.6 on 11/9,   Monitor for signs of bleeding.   On aranesp for erythropoiesis and nulecit for iron load  Labs with HD 13. CAD s/p DES X 2 and MVR: Monitor for symptoms with increase in activity. On plavix and Lipitor.  14. Acute respiratory failure due to acute pulmonary edema:  Fluid overload compensated with HD.   No peripheral edema lungs have mild stridor otherwise no resp distress 15. AFlutter s/p DCCV/PAF: HR controlled on amiodarone. On coumadin.  16. Persistent cough with dysphonia: Off antibiotics. Recheck CXR, cont neb  Continue robinul for secretions and Robitussin DM for cough, will consider weaning when appropriate  Encourage IS with use of flutter valve 17. Sleep disturbance: managed with benadryl prn  LOS (Days) 4 A FACE TO FACE EVALUATION WAS PERFORMED  Charlett Blake 10/15/2016 7:52 AM

## 2016-10-15 NOTE — Progress Notes (Addendum)
[address]       [place] [address]             [phone]         Subjective: C/o cough and congestion but feels she is improving with rehab and getting stronger  Objective: Vital signs in last 24 hours: Temp:  [98.3 F (36.8 C)-98.9 F (37.2 C)] 98.9 F (37.2 C) (11/12 0620) Pulse Rate:  [90-110] 100 (11/12 0620) Resp:  [16-22] 20 (11/12 0620) BP: (101-180)/(42-100) 108/52 (11/12 0620) SpO2:  [97 %-100 %] 98 % (11/12 0620) Weight:  [125 lb (56.7 kg)-127 lb 6.8 oz (57.8 kg)] 125 lb (56.7 kg) (11/12 0620)  Hemodynamic parameters for last 24 hours:    Intake/Output from previous day: 11/11 0701 - 11/12 0700 In: 300 [P.O.:300] Out: 1500  Intake/Output this shift: Total I/O In: 100 [P.O.:100] Out: -   General appearance: alert, cooperative and no distress Heart: regular rate and rhythm and tachy Lungs: min dim in bases Abdomen: benign Extremities: no edema Wound: incis healing well  Lab Results: No results for input(s): WBC, HGB, HCT, PLT in the last 72 hours. BMET:  Recent Labs  10/14/16 0411 10/15/16 0319  NA 132* 135  K 4.0 3.6  CL 94* 95*  CO2 24 25  GLUCOSE 212* 93  BUN 25* 34*  CREATININE 4.41* 5.76*  CALCIUM 8.5* 8.5*    PT/INR:  Recent Labs  10/15/16 0735  LABPROT 34.2*  INR 3.29   ABG    Component Value Date/Time   PHART 7.487 (H) 10/04/2016 1430   HCO3 28.4 (H) 10/04/2016 1430   TCO2 26 09/08/2016 0412   ACIDBASEDEF 4.1 (H) 09/02/2016 1850   O2SAT 95.5 10/04/2016 1430   CBG (last 3)   Recent Labs  10/14/16 1642 10/14/16 2035 10/15/16 0635  GLUCAP 172* 126* 111*    Meds Scheduled Meds: . amiodarone  200 mg Oral BID  . atorvastatin  20 mg Oral q1800  . benzonatate  100 mg Oral TID  . clopidogrel  75 mg Oral Daily  . [START ON 10/17/2016] darbepoetin (ARANESP) injection - DIALYSIS  100 mcg Intravenous Q Tue-HD  . famotidine  20 mg Oral Daily  . feeding supplement  1 Container Oral TID BM  .  feeding supplement (PRO-STAT SUGAR FREE 64)  30 mL Oral TID  . ferric gluconate (FERRLECIT/NULECIT) IV  250 mg Intravenous Q T,Th,Sa-HD  . Gerhardt's butt cream   Topical BID  . glycopyrrolate  1 mg Per Tube BID  . guaiFENesin  30 mL Per Tube Q6H  . hydrocortisone   Rectal BID  . insulin aspart  0-5 Units Subcutaneous QHS  . insulin aspart  0-9 Units Subcutaneous TID WC  . insulin detemir  7 Units Subcutaneous BID  . lanthanum  1,000 mg Oral TID WC  . metoCLOPramide  10 mg Oral TID AC  . midodrine  10 mg Oral q morning - 10a  . multivitamin  1 tablet Oral QHS  . multivitamin  15 mL Per Tube Daily  . pantoprazole  20 mg Oral BID  . sodium chloride flush  10-40 mL Intracatheter Q12H  . Warfarin - Pharmacist Dosing Inpatient   Does not apply q1800   Continuous Infusions: PRN Meds:.acetaminophen, albuterol, ALPRAZolam, bisacodyl, chlorpheniramine-HYDROcodone, diphenhydrAMINE, diphenhydrAMINE, diphenhydrAMINE, guaiFENesin-dextromethorphan, HYDROcodone-acetaminophen, prochlorperazine **OR** prochlorperazine **OR** prochlorperazine, sodium chloride flush, sodium phosphate, traZODone  Xrays Dg Chest 2 View  Result Date: 10/15/2016 CLINICAL DATA:  Cough.  Chest pain  EXAM: CHEST  2 VIEW COMPARISON:  10/11/2016 FINDINGS: There is a left chest wall dialysis catheter with tips in the cavoatrial junction. Moderate cardiac enlargement. Previous median sternotomy and mitral bowel replacement. No pleural effusion or edema. No airspace opacities. IMPRESSION: 1. No acute cardiopulmonary abnormalities. Electronically Signed   By: Kerby Moors M.D.   On: 10/15/2016 09:13    Assessment/Plan:  1 making steady progress in rehab   LOS: 4 days    GOLD,WAYNE E 10/15/2016  patient examined and medical record reviewed,agree with above note. Tharon Aquas Trigt III 10/16/2016

## 2016-10-15 NOTE — Progress Notes (Signed)
ANTICOAGULATION CONSULT NOTE - Follow Up Consult  Pharmacy Consult for coumadin Indication: a. flutter  No Known Allergies  Patient Measurements: Height: 5\' 1"  (154.9 cm) Weight: 125 lb (56.7 kg) IBW/kg (Calculated) : 47.8  Vital Signs: Temp: 98.9 F (37.2 C) (11/12 0620) Temp Source: Oral (11/12 0620) BP: 108/52 (11/12 0620) Pulse Rate: 100 (11/12 0620)  Labs:  Recent Labs  10/13/16 0538 10/14/16 0411 10/15/16 0319 10/15/16 0735  LABPROT 34.9* 41.0*  --  34.2*  INR 3.37 4.12*  --  3.29  CREATININE 2.74* 4.41* 5.76*  --     Estimated Creatinine Clearance: 7.9 mL/min (by C-G formula based on SCr of 5.76 mg/dL (H)).  Assessment: 60 yo female with a flutter and currently supratherapeutic INR.  -INR decreased to 3.29. Held coumadin since 11/8.   Goal of Therapy:  INR 2 - 2.5 per MD Monitor platelets by anticoagulation protocol: Yes   Plan:  - No coumadin tonight - Daily PT/INR - Back on 2W, Prescott Gum was going to do 2.5 mg po daily FYI  Myer Peer Grayland Ormond), PharmD  PGY1 Pharmacy Resident Pager: [phone] 10/15/2016 8:57 AM

## 2016-10-15 NOTE — Progress Notes (Signed)
Patient ID: Deborah [name], female   DOB: 01/23/1956, 60 y.o.   MRN: [id number]  [facility] Progress Note   Subjective:    Had HD yesterday Wt 56.8 post (I think close to EDW)  Cough is her biggest complaint Coughing so much it's making her sick Had repeat CXR - clear Sounds upper airway to me Resps stridorous Does she need ENT to take a look?    Objective:   BP (!) 108/52 (BP Location: Right Arm)   Pulse 100   Temp 98.9 F (37.2 C) (Oral)   Resp 20   Ht 5\' 1"  (1.549 m)   Wt 56.7 kg (125 lb)   SpO2 98%   BMI 23.62 kg/m   Physical Exam: VS as noted NAD Prev trach site closed Chest is clear anteriorly, but with cough, stridorous sound to insp S1S2 No S3 Healed sternotomy scar Abd soft and not tender No edema of LE's Left upper arm AVG + bruit (10/30) Left sided TDC (9/28 VVS)   Recent Labs Lab 10/10/16 0356 10/11/16 0348 10/11/16 1720 10/12/16 0628 10/13/16 0538 10/14/16 0411 10/15/16 0319 10/15/16 0735  NA 133* 133* 129* 130* 132* 132* 135 134*  K 4.0 3.4* 4.6 4.2 3.9 4.0 3.6 3.8  CL 91* 94* 93* 94* 95* 94* 95* 95*  CO2 24 26 23 23 24 24 25 23   GLUCOSE 132* 53* 195* 146* 143* 212* 93 134*  BUN 82* 22* 27* 32* 12 25* 34* 5*  CREATININE 5.88* 3.44* 4.20* 5.04* 2.74* 4.41* 5.76* 2.05*  CALCIUM 9.1 8.5* 8.4* 8.5* 8.1* 8.5* 8.5* 8.5*  PHOS 7.2* 5.3*  --  7.5* 5.9* 8.7* 9.2* 3.1   Iron/TIBC/Ferritin/ %Sat    Component Value Date/Time   IRON 22 (L) 10/10/2016 0356   TIBC 326 10/10/2016 0356   FERRITIN 272 10/10/2016 0356   IRONPCTSAT 7 (L) 10/10/2016 0356   Results for Deborah [name], Deborah [name] (MRN [id number]) as of 10/12/2016 08:46  Ref. Range 10/10/2016 03:56  PTH Latest Ref Range: 15 - 65 pg/mL 199 (H)    Recent Labs Lab 10/10/16 0356 10/11/16 0348 10/12/16 0628  WBC 18.7* 15.1* 15.0*  NEUTROABS  --   --  11.1*  HGB 11.1* 10.3* 10.5*  HCT 34.6* 33.5* 33.2*  MCV 86.7 87.9 86.7  PLT 529* 475* 481*    Lab Results  Component Value  Date   INR 3.29 10/15/2016   INR 4.12 (HH) 10/14/2016   INR 3.37 10/13/2016    Medications:    . amiodarone  200 mg Oral BID  . atorvastatin  20 mg Oral q1800  . benzonatate  100 mg Oral TID  . clopidogrel  75 mg Oral Daily  . [START ON 10/17/2016] darbepoetin (ARANESP) injection - DIALYSIS  100 mcg Intravenous Q Tue-HD  . famotidine  20 mg Oral Daily  . feeding supplement  1 Container Oral TID BM  . feeding supplement (PRO-STAT SUGAR FREE 64)  30 mL Oral TID  . ferric gluconate (FERRLECIT/NULECIT) IV  250 mg Intravenous Q T,Th,Sa-HD  . Gerhardt's butt cream   Topical BID  . glycopyrrolate  1 mg Per Tube BID  . guaiFENesin  600 mg Oral Q6H  . hydrocortisone   Rectal BID  . insulin aspart  0-5 Units Subcutaneous QHS  . insulin aspart  0-9 Units Subcutaneous TID WC  . insulin detemir  7 Units Subcutaneous BID  . lanthanum  1,000 mg Oral TID WC  . metoCLOPramide  10 mg Oral TID AC  .  midodrine  10 mg Oral q morning - 10a  . multivitamin  1 tablet Oral QHS  . multivitamin  15 mL Per Tube Daily  . pantoprazole  20 mg Oral BID  . sodium chloride flush  10-40 mL Intracatheter Q12H  . Warfarin - Pharmacist Dosing Inpatient   Does not apply q1800   Background: 60 y/o AAF with DM2, HTN, HLD presented to [facility] ER with infposterior STEMI on 9/12->cath->. PCS with stent x 2 by Dr. Clayborn Bigness. EF 55-60% with severe MR.  Post-cath respiratory failure ->intubation. Shock and renal failure (creatinine 1.6 on adm). Trialysis catheter placed 9/14 and pt had CRRT at [facility] for 48 hours prior to transfer here on 08/18/16->back to cath lab -> ruptured posterior papillary muscle with severe MR->IABP/[place]. We were asked to see d/t progressive rise in creatinine/volume overload/anuric AKI .  CRRT re-initiated 08/21/16-9/20, then 9/21 post MVR. Transitioned to IHD.  Dialysis dependent since 08/17/16. No recovery. New ESRD.  Assessment/ Plan:    1. CAD status post STEMI/ruptured posterior papillary muscle s/p  mitral valve replacement on 08/23/16. Amio. Coumadin. INR supratherapeutic. Pharmacy managing. Using midodrine for BP.  2. 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  TTS schedule via Left TDC (9/28 Scot Dock). S/P LUA AVG placed on 10/30 (brachioaxillary) by Dr. Donzetta Matters - will be ready by Nov 30. EDW looks to be about 57 kg. (outpt has spot [place] TTS)   3. Anemia: 2/2  ESRD/Fe def - on darbe 100 QTuesday. TSat 7%. Fe load with HD. Last Hb 10.5. 4. CKD-MBD: Phos high. Started lanthanum. Phos has been all over the place. Suspect not getting the binder with her food. Increased to 2 gm ac. PTH 199 no intervention needed yet. 2Ca bath with HD. Follow labs w/HD 5. Nutrition: Low albumin secondary to critical illness/prolonged hospitalization, (Nepro/continue renal vitamin)  Cough/intermittent dyspnea: Last CXR 11/5 clear, no edema. I presume aspiration has been the concern. On Dysphagia diet. Resp is stridorous but lungs o/w clear Does ENT need to take a look? (Could she have some tracheal stenosis/)  6. Disposition - Cont rehab. Eventual home, HD at [place], [facility] [phone] Pager 10/15/2016, 11:49 AM

## 2016-10-15 NOTE — Progress Notes (Signed)
Physical Therapy Session Note  Patient Details  Name: Deborah [name] MRN: [id number] Date of Birth: 12-23-1955  Today's Date: 10/15/2016 PT Individual Time: 4081-4481 PT Individual Time Calculation (min): 48 min   Short Term Goals: Week 1:  PT Short Term Goal 1 (Week 1): Patient will perform transfers with min A.  PT Short Term Goal 2 (Week 1): Patient will ambulate 75 ft with min A.  PT Short Term Goal 3 (Week 1): Patient will maintain dynamic standing balance x 1 min with min A.  PT Short Term Goal 4 (Week 1): Patient will negotiate up/down 4 stairs using L rail only with min A.  PT Short Term Goal 5 (Week 1): Patient will propel wheelchair x 150 ft with supervision for strengthening.   Skilled Therapeutic Interventions/Progress Updates:    Patient in bed upon arrival with son present. Patient sat EOB to perform UB/LB dressing with assist to thread BLE and min A overall to transfer to wheelchair. Performed grooming at sink from wheelchair level with setup assist and increased time. Patient propelled wheelchair using BUE to gym with supervision and extra time and cues for sequencing and technique. Sit <> stand transfer training using RW with max verbal cues for safe hand placement and supervision overall. Gait training using RW x 80 ft + 25 ft + 25 ft with close supervision-steady assist and cues to decrease foot drag. Stair training up/down 4 (6") stairs x 2 using 2 rails with cues to ascend using LLE and descend using RLE with min A overall and decreased stability noted. Performed NuStep using BLE only at level 3 x < 29min before patient requested to return to bed. Patient appeared fatigued and required multiple seated rest breaks throughout session due to shortness of breath and frequent coughing. Patient left sitting in wheelchair with son present while nurse tech changed bed sheets. Patient missed 12 min of skilled physical therapy due to fatigue.   Therapy Documentation Precautions:   Precautions Precautions: Fall Restrictions Weight Bearing Restrictions: Yes (sternal precautions) Vital Signs: 99% Sp02 on ra HR 114 after ambulation Pain: Pain Assessment Pain Assessment: No/denies pain   See Function Navigator for Current Functional Status.   Therapy/Group: Individual Therapy  Laretta Alstrom 10/15/2016, 1:47 PM

## 2016-10-16 ENCOUNTER — Inpatient Hospital Stay (HOSPITAL_COMMUNITY): Payer: Self-pay | Admitting: Occupational Therapy

## 2016-10-16 ENCOUNTER — Inpatient Hospital Stay (HOSPITAL_COMMUNITY): Payer: Medicaid Other | Admitting: Speech Pathology

## 2016-10-16 ENCOUNTER — Inpatient Hospital Stay (HOSPITAL_COMMUNITY): Payer: Medicaid Other

## 2016-10-16 LAB — RENAL FUNCTION PANEL
ALBUMIN: 2.8 g/dL — AB (ref 3.5–5.0)
ANION GAP: 14 (ref 5–15)
BUN: 21 mg/dL — ABNORMAL HIGH (ref 6–20)
CHLORIDE: 94 mmol/L — AB (ref 101–111)
CO2: 23 mmol/L (ref 22–32)
Calcium: 8.6 mg/dL — ABNORMAL LOW (ref 8.9–10.3)
Creatinine, Ser: 4.01 mg/dL — ABNORMAL HIGH (ref 0.44–1.00)
GFR calc Af Amer: 13 mL/min — ABNORMAL LOW (ref >60)
GFR calc non Af Amer: 11 mL/min — ABNORMAL LOW (ref >60)
GLUCOSE: 167 mg/dL — AB (ref 65–99)
PHOSPHORUS: 5.4 mg/dL — AB (ref 2.5–4.6)
POTASSIUM: 4.3 mmol/L (ref 3.5–5.1)
Sodium: 131 mmol/L — ABNORMAL LOW (ref 135–145)

## 2016-10-16 LAB — GLUCOSE, CAPILLARY
GLUCOSE-CAPILLARY: 164 mg/dL — AB (ref 65–99)
GLUCOSE-CAPILLARY: 267 mg/dL — AB (ref 65–99)
Glucose-Capillary: 73 mg/dL (ref 65–99)
Glucose-Capillary: 116 mg/dL — ABNORMAL HIGH (ref 65–99)

## 2016-10-16 LAB — PROTIME-INR
INR: 4.3
Prothrombin Time: 42.4 seconds — ABNORMAL HIGH (ref 11.4–15.2)

## 2016-10-16 MED ORDER — INSULIN DETEMIR 100 UNIT/ML ~~LOC~~ SOLN
9.0000 [IU] | Freq: Two times a day (BID) | SUBCUTANEOUS | Status: DC
  Administered 2016-10-16 – 2016-10-17 (×3): 9 [IU] via SUBCUTANEOUS
  Filled 2016-10-16 (×4): qty 0.09

## 2016-10-16 MED ORDER — METOCLOPRAMIDE HCL 5 MG PO TABS
5.0000 mg | ORAL_TABLET | Freq: Three times a day (TID) | ORAL | Status: DC
  Administered 2016-10-16 – 2016-10-17 (×3): 5 mg via ORAL
  Filled 2016-10-16 (×3): qty 1

## 2016-10-16 NOTE — Progress Notes (Signed)
ANTICOAGULATION CONSULT NOTE - Follow Up Consult  Pharmacy Consult for coumadin Indication: a flutter  No Known Allergies  Patient Measurements: Height: 5\' 1"  (154.9 cm) Weight: 125 lb (56.7 kg) IBW/kg (Calculated) : 47.8 Heparin Dosing Weight:   Vital Signs: Temp: 98.4 F (36.9 C) (11/13 0510) Temp Source: Oral (11/13 0510) BP: 91/49 (11/13 0510) Pulse Rate: 91 (11/13 0510)  Labs:  Recent Labs  10/14/16 0411 10/15/16 0319 10/15/16 0735 10/16/16 0607  LABPROT 41.0*  --  34.2* 42.4*  INR 4.12*  --  3.29 4.30*  CREATININE 4.41* 5.76* 2.05* 4.01*    Estimated Creatinine Clearance: 11.4 mL/min (by C-G formula based on SCr of 4.01 mg/dL (H)).   Medications:  Scheduled:  . amiodarone  200 mg Oral BID  . atorvastatin  20 mg Oral q1800  . benzonatate  100 mg Oral TID  . clopidogrel  75 mg Oral Daily  . [START ON 10/17/2016] darbepoetin (ARANESP) injection - DIALYSIS  100 mcg Intravenous Q Tue-HD  . famotidine  20 mg Oral Daily  . feeding supplement  1 Container Oral TID BM  . feeding supplement (PRO-STAT SUGAR FREE 64)  30 mL Oral TID  . ferric gluconate (FERRLECIT/NULECIT) IV  250 mg Intravenous Q T,Th,Sa-HD  . Gerhardt's butt cream   Topical BID  . glycopyrrolate  1 mg Per Tube BID  . guaiFENesin  600 mg Oral Q6H  . hydrocortisone   Rectal BID  . insulin aspart  0-5 Units Subcutaneous QHS  . insulin aspart  0-9 Units Subcutaneous TID WC  . insulin detemir  7 Units Subcutaneous BID  . lanthanum  1,000 mg Oral TID WC  . metoCLOPramide  10 mg Oral TID AC  . midodrine  10 mg Oral q morning - 10a  . multivitamin  1 tablet Oral QHS  . multivitamin  15 mL Per Tube Daily  . pantoprazole  20 mg Oral BID  . sodium chloride flush  10-40 mL Intracatheter Q12H  . Warfarin - Pharmacist Dosing Inpatient   Does not apply q1800   Infusions:    Assessment: 60 yo female with a flutter is currently on supratherapeutic coumadin.  INR today is up to 4.3 even though no doses for  few days.  Goal of Therapy:  INR 2 - 2.5 Monitor platelets by anticoagulation protocol: Yes   Plan:  - no coumadin tonight - INR in am  Alberta Cairns, Tsz-Yin 10/16/2016,7:58 AM

## 2016-10-16 NOTE — Progress Notes (Signed)
Occupational Therapy Session Note  Patient Details  Name: Deborah [name] MRN: [id number] Date of Birth: 1956/03/08  Today's Date: 10/16/2016 OT Individual Time: 0900-1015 OT Individual Time Calculation (min): 75 min    Short Term Goals: Week 1:  OT Short Term Goal 1 (Week 1): Pt will tolerate 5 minutes standing at the sink to complete ADL tasks  with close supervision.  OT Short Term Goal 2 (Week 1): Pt will complete LB dressing with Min A OT Short Term Goal 3 (Week 1): Pt will complete toilet transfer with overall Min A OT Short Term Goal 4 (Week 1): Pt will follow 75% commands with min questioning cues.  Skilled Therapeutic Interventions/Progress Updates:    1:1 OT session focused on activity tolerance, modified bathing/dressing, and sequencing during ADL tasks. Bathing/dressing completed at the sink. Pt required  Min verbal cues to initiate tasks. Min A sit<>stands and CGA w/ intermittent Min A for posterior LOB during standing. SpO2 remained above 93% during ADLs. Pt tolerated standing for entire tooth brushing task today, but with increased SOB noted s/p standing activity. Pt then propelled w/c to therapy gym with Mod A and instructional cues for technique. Facilitated trunk extension, and weight shifting with sit<>stand reaching activity. Pt's SpO2 remained above 97% but increased SOB noted, HR 107. Pt took extended rest break, then ambulated 10 feet w/ RW and min A. Pt returned to room and left seated in w/c with son present. Therapy Documentation Precautions:  Precautions Precautions: Fall Restrictions Weight Bearing Restrictions: Yes (sternal precautions) Pain: Pain Assessment Pain Assessment: No/denies pain Pain Score: 0-No pain ADL: ADL ADL Comments: Please see functional navigator  See Function Navigator for Current Functional Status.   Therapy/Group: Individual Therapy  Valma Cava 10/16/2016, 3:28 PM

## 2016-10-16 NOTE — Progress Notes (Signed)
Deborah [name] Advertising copywriter at 658 of Critical Lab INR 4.30 : Unable to reach Dr. Posey Pronto [phone]), notified Silvestre Mesi noted will continue to monitor and observe.   Patient A/O, no noted distress and pain.

## 2016-10-16 NOTE — Progress Notes (Addendum)
Physical Therapy Session Note  Patient Details  Name: Deborah [name] MRN: [id number] Date of Birth: 14-Apr-1956  Today's Date: 10/16/2016 PT Individual Time: 0301-3143 PT Individual Time Calculation (min): 60 min    Short Term Goals: Week 1:  PT Short Term Goal 1 (Week 1): Patient will perform transfers with min A.  PT Short Term Goal 2 (Week 1): Patient will ambulate 75 ft with min A.  PT Short Term Goal 3 (Week 1): Patient will maintain dynamic standing balance x 1 min with min A.  PT Short Term Goal 4 (Week 1): Patient will negotiate up/down 4 stairs using L rail only with min A.  PT Short Term Goal 5 (Week 1): Patient will propel wheelchair x 150 ft with supervision for strengthening.   Skilled Therapeutic Interventions/Progress Updates:    Session focused on functional transfers, [place] HEP for strengthening and balance, overall endurance, gait with RW, and standing balance/tolerance while performing word search puzzle. Pt overall close supervision to steadying assist for transfers and gait using RW with cues for upright posture, step length, and correct positioning of RW during gait. Pt limited by coughing and low endurance but able to complete full session with rest breaks. Pt completed 10 reps each BLE of LAQ with 2# ankle weights, standing hip abduction with 2# ankle weights, standing hamstring curls (weight on LLE only), mini squats, and seated heel/toe raises (unable to complete correctly in standing). Also practice sit <> stands without UE support to work on eccentric control and transitional movements x 5 reps with min assist. Pt able to stand while completing puzzle with supervision x 1 min 26 sec and again x 1 min 24 sec before too fatigued. Reviewed d/c planning and conference schedule with pt and son who observed therapy session.    O2 (93-100%) and HR (96-102 bpm) monitored during session with activity.  Therapy Documentation Precautions:  Precautions Precautions:  Fall Restrictions Weight Bearing Restrictions: Yes (sternal precautions)   Pain:  Denies pain. Cough is most bothersome   See Function Navigator for Current Functional Status.   Therapy/Group: Individual Therapy  Canary Brim Ivory Broad, PT, DPT  10/16/2016, 11:58 AM

## 2016-10-16 NOTE — Progress Notes (Signed)
Speech Language Pathology Daily Session Note  Patient Details  Name: Deborah [name] MRN: [id number] Date of Birth: 1956-02-17  Today's Date: 10/16/2016 SLP Individual Time: 1310-1400 SLP Individual Time Calculation (min): 50 min   Short Term Goals: Week 1: SLP Short Term Goal 1 (Week 1): Pt will selectively attend to tasks in a mildly distracting environment for 5-7 minutes with supervision verbal cues for redirection SLP Short Term Goal 2 (Week 1): Pt will complete basic familiar tasks with supervision verbal cues for functional problem solving.  SLP Short Term Goal 3 (Week 1): Pt will recall new information with supervision verbal cues for use of external aids.  SLP Short Term Goal 4 (Week 1): Pt will return demonstration of vocal hygiene techniques with min assist verbal cues.   SLP Short Term Goal 5 (Week 1): Pt will observe swallow precautions with PO intake at mod I level.  SLP Short Term Goal 6 (Week 1): Pt will tolerate trials of regular with oral management WFL at mod I.  Skilled Therapeutic Interventions:Skilled therapy intervention focused on cognitive and dysphagia goals. Patient observed with thin liquid and meal tray of Dys. 3 textures with consistent baseline cough that did not appear to change with PO. Patient independently utilized chin tuck compensatory strategy for thin liquid, but required Min A verbal cues for use of liquid wash between bites of meal. Patient with persistent cough throughout session, which she reported is making her hesitant to eat very much of her meal. Given a medication management task, patient required Mod A verbal cues for functional problem solving to utilize a 4 time per day pillbox. Patient left upright in bed with all needs within reach and family member in room. Continue current plan of care.   Function:  Eating Eating   Modified Consistency Diet: Yes Eating Assist Level: Supervision or verbal cues           Cognition Comprehension  Comprehension assist level: Understands basic 90% of the time/cues < 10% of the time  Expression   Expression assist level: Expresses basic 75 - 89% of the time/requires cueing 10 - 24% of the time. Needs helper to occlude trach/needs to repeat words.  Social Interaction Social Interaction assist level: Interacts appropriately 90% of the time - Needs monitoring or encouragement for participation or interaction.  Problem Solving Problem solving assist level: Solves basic 50 - 74% of the time/requires cueing 25 - 49% of the time  Memory Memory assist level: Recognizes or recalls 50 - 74% of the time/requires cueing 25 - 49% of the time    Pain Pain Assessment Pain Assessment: No/denies pain Pain Score: 0-No pain  Therapy/Group: Individual Therapy  Thornton Papas 10/16/2016, 4:23 PM

## 2016-10-16 NOTE — Progress Notes (Addendum)
      [address]       [place] [address]             [phone]         Subjective: Still coughing. She does get short of breath occasionally during coughing episodes and with increased distance during her rehab sessions.   Objective: Vital signs in last 24 hours: Temp:  [98.4 F (36.9 C)-98.5 F (36.9 C)] 98.4 F (36.9 C) (11/13 0510) Pulse Rate:  [91-104] 91 (11/13 0510) Resp:  [18] 18 (11/13 0510) BP: (91-130)/(49-75) 91/49 (11/13 0510) SpO2:  [98 %-100 %] 98 % (11/13 0510)     Intake/Output from previous day: 11/12 0701 - 11/13 0700 In: 380 [P.O.:380] Out: -  Intake/Output this shift: Total I/O In: 240 [P.O.:240] Out: -   General appearance: alert, cooperative and no distress Heart: regular rate and rhythm Lungs: clear to auscultation bilaterally Extremities: edema none Wound: healed  Lab Results: No results for input(s): WBC, HGB, HCT, PLT in the last 72 hours. BMET:  Recent Labs  10/15/16 0735 10/16/16 0607  NA 134* 131*  K 3.8 4.3  CL 95* 94*  CO2 23 23  GLUCOSE 134* 167*  BUN 5* 21*  CREATININE 2.05* 4.01*  CALCIUM 8.5* 8.6*    PT/INR:  Recent Labs  10/16/16 0607  LABPROT 42.4*  INR 4.30*   ABG    Component Value Date/Time   PHART 7.487 (H) 10/04/2016 1430   HCO3 28.4 (H) 10/04/2016 1430   TCO2 26 09/08/2016 0412   ACIDBASEDEF 4.1 (H) 09/02/2016 1850   O2SAT 95.5 10/04/2016 1430   CBG (last 3)   Recent Labs  10/15/16 1649 10/15/16 2033 10/16/16 0638  GLUCAP 164* 130* 164*    Assessment/Plan: S/P  Continues to make good progress in rehab Coumadin dosing per pharmacy Tolerating a regular diet without issue Still c/o cough but with multiple PRN agents.    LOS: 5 days    Elgie Collard 10/16/2016 patient examined and medical record reviewed,agree with above note. Tharon Aquas Trigt III 10/16/2016

## 2016-10-16 NOTE — Progress Notes (Signed)
[facility] PHYSICAL MEDICINE & REHABILITATION     PROGRESS NOTE  Subjective/Complaints:  Pt seen sitting up in bed this AM.  She continues to cough.  Otherwise, she slept well overnight.    ROS: +Cough. Denies CP, SOB, N/V/D.  Objective: Vital Signs: Blood pressure (!) 91/49, pulse 91, temperature 98.4 F (36.9 C), temperature source Oral, resp. rate 18, height 5\' 1"  (1.549 m), weight 56.7 kg (125 lb), SpO2 98 %. Dg Chest 2 View  Result Date: 10/15/2016 CLINICAL DATA:  Cough.  Chest pain EXAM: CHEST  2 VIEW COMPARISON:  10/11/2016 FINDINGS: There is a left chest wall dialysis catheter with tips in the cavoatrial junction. Moderate cardiac enlargement. Previous median sternotomy and mitral bowel replacement. No pleural effusion or edema. No airspace opacities. IMPRESSION: 1. No acute cardiopulmonary abnormalities. Electronically Signed   By: Kerby Moors M.D.   On: 10/15/2016 09:13   No results for input(s): WBC, HGB, HCT, PLT in the last 72 hours.  Recent Labs  10/15/16 0735 10/16/16 0607  NA 134* 131*  K 3.8 4.3  CL 95* 94*  GLUCOSE 134* 167*  BUN 5* 21*  CREATININE 2.05* 4.01*  CALCIUM 8.5* 8.6*   CBG (last 3)   Recent Labs  10/15/16 1649 10/15/16 2033 10/16/16 0638  GLUCAP 164* 130* 164*    Wt Readings from Last 3 Encounters:  10/15/16 56.7 kg (125 lb)  10/11/16 58.1 kg (128 lb 1.4 oz)  08/18/16 71.2 kg (156 lb 15.5 oz)    Physical Exam:  BP (!) 91/49 (BP Location: Right Arm)   Pulse 91   Temp 98.4 F (36.9 C) (Oral)   Resp 18   Ht 5\' 1"  (1.549 m)   Wt 56.7 kg (125 lb)   SpO2 98%   BMI 23.62 kg/m  Constitutional:  She appears well-developed and well-nourished. NAD. HENT: Normocephalic and atraumatic.  Eyes: EOM are normal. No discharge.  Cardiovascular: RRR. No JVD. Respiratory: +Cough. Effort normal otherwise. She has no wheezes. GI: Soft. Bowel sounds are normal. She exhibits no distension.  Musculoskeletal: She exhibits tenderness (LUE-AVG). She  exhibits no edema.  Neurological: She is alert and oriented.  Dysphonic, improving Motor: 4-4+/5 proximally, 4+/5 distally Skin: Skin is warm and dry. She is not diaphoretic. No erythema.  Incision healing Psychiatric: Her affect is blunt. She is slowed, improving.    Assessment/Plan: 1. Functional deficits secondary to debility from multiply cardial and pulmonary issues which require 3+ hours per day of interdisciplinary therapy in a comprehensive inpatient rehab setting. Physiatrist is providing close team supervision and 24 hour management of active medical problems listed below. Physiatrist and rehab team continue to assess barriers to discharge/monitor patient progress toward functional and medical goals.  Function:  Bathing Bathing position   Position: Wheelchair/chair at sink  Bathing parts Body parts bathed by patient: Right arm, Left arm, Chest, Abdomen, Right upper leg, Left upper leg, Front perineal area Body parts bathed by helper: Left lower leg, Right lower leg, Buttocks  Bathing assist Assist Level: Touching or steadying assistance(Pt > 75%)      Upper Body Dressing/Undressing Upper body dressing   What is the patient wearing?: Pull over shirt/dress     Pull over shirt/dress - Perfomed by patient: Thread/unthread right sleeve, Thread/unthread left sleeve Pull over shirt/dress - Perfomed by helper: Put head through opening, Pull shirt over trunk        Upper body assist        Lower Body Dressing/Undressing Lower body dressing  What is the patient wearing?: Pants, Non-skid slipper socks     Pants- Performed by patient: Pull pants up/down Pants- Performed by helper: Thread/unthread right pants leg, Thread/unthread left pants leg   Non-skid slipper socks- Performed by helper: Don/doff left sock, Don/doff right sock                  Lower body assist Assist for lower body dressing: Touching or steadying assistance (Pt > 75%)       Toileting Toileting Toileting activity did not occur: No continent bowel/bladder event   Toileting steps completed by helper: Performs perineal hygiene, Adjust clothing prior to toileting, Adjust clothing after toileting    Toileting assist Assist level: Touching or steadying assistance (Pt.75%)   Transfers Chair/bed transfer Chair/bed transfer activity did not occur: Safety/medical concerns Chair/bed transfer method: Stand pivot Chair/bed transfer assist level: Touching or steadying assistance (Pt > 75%) Chair/bed transfer assistive device: Armrests, Medical sales representative     Max distance: 80 ft Assist level: Touching or steadying assistance (Pt > 75%)   Wheelchair   Type: Manual Max wheelchair distance: 100 ft Assist Level: Supervision or verbal cues  Cognition Comprehension Comprehension assist level: Understands basic 90% of the time/cues < 10% of the time  Expression Expression assist level: Expresses basic 75 - 89% of the time/requires cueing 10 - 24% of the time. Needs helper to occlude trach/needs to repeat words.  Social Interaction Social Interaction assist level: Interacts appropriately 90% of the time - Needs monitoring or encouragement for participation or interaction.  Problem Solving Problem solving assist level: Solves basic 75 - 89% of the time/requires cueing 10 - 24% of the time  Memory Memory assist level: Recognizes or recalls 50 - 74% of the time/requires cueing 25 - 49% of the time    Medical Problem List and Plan: 1.  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  Cont CIR 2.  DVT Prophylaxis/Anticoagulation: Pharmaceutical: Coumadin  INR supra therapeutic 11/13, appreciate Pharmacy recs 3. Pain Management: Hydrocodone prn effective. 4. Mood: LCSW to follow for evaluation and support.  5. Neuropsych: This patient is not fully capable of making decisions on her own  behalf. 6. Skin/Wound Care: Routine pressure relief measures.  7. Fluids/Electrolytes/Nutrition: Strict I/O. Renal panel TTSa with HD.   D3, thin diet, advance diet as tolerated  Will wean reglan 10/13 and cont 8. Leucocytosis:   Cont cipro for bronchitis   WBCs 15.0 on 11/9  Labs with HD 10 T2DM:Hgb A1c- 13.3.   Levemir increased to 7U bid on 11/10, increased to [place] on 11/13  Will monitor blood sugars ac/hs with SSI for elevated BS   CBG (last 3)   Recent Labs  10/15/16 1649 10/15/16 2033 10/16/16 0638  GLUCAP 164* 130* 164*    11. ESRD: Now HD dependent--to schedule HD in afternoons to help with tolerance of therapy during the day. Educate patient and family on renal diet. Electrolyte abnormalities being managed during HD.  12. Anemia of chronic disease:   Hb 10.5 on 11/9   Monitor for signs of bleeding.   On aranesp for erythropoiesis and nulecit for iron load  Labs with HD 13. CAD s/p DES X 2 and MVR: Monitor for symptoms with increase in activity. On plavix and Lipitor.  14. Acute respiratory failure due to acute pulmonary edema:  Fluid overload compensated with HD.  15. AFlutter s/p DCCV/PAF: HR controlled on amiodarone. On  coumadin.  16. Persistent cough with dysphonia: Off antibiotics. Repeat CXR 11/12 unremarkable for acute process  Cont neb  Continue robinul for secretions and Robitussin DM for cough, will consider weaning when appropriate  Encourage IS with use of flutter valve  Will consider ENT as outpt 17. Sleep disturbance: managed with benadryl prn  LOS (Days) 5 A FACE TO FACE EVALUATION WAS PERFORMED  Gavynn Duvall Lorie Phenix 10/16/2016 8:33 AM

## 2016-10-16 NOTE — Progress Notes (Signed)
Day 2 of 2: Calorie Count Note  48 hour calorie count ordered.  Diet: Dysphagia 3 diet with thin liquids Supplements:   Boost Breeze po TID, each supplement provides 250 kcal and 9 grams of protein.  30 ml Prostat po TID, each supplement provides 100 kcal and 15 grams of protein.   Breakfast: 102 kcal, 2 grams of protein Lunch: No information given Dinner: 291 kcal, 16 grams of protein Supplements:  950 kcal, 42 grams of protein  Day 2 Total intake: 1343 kcal (79% of minimum estimated needs)  60 grams of protein (75% of minimum estimated needs)  Estimated Nutritional Needs:  Kcal:  1700-1900 Protein:  80-100 grams Fluid:  Per MD  Nutrition Dx:  Malnutrition related to chronic illness as evidenced by moderate depletion of body fat, moderate depletions of muscle mass; ongoing  Goal:  Pt to meet >/= 90% of their estimated nutrition needs; progressing  Intervention:   Discontinue calorie count  Provide Boost Breeze po TID, each supplement provides 250 kcal and 9 grams of protein.  Provide 30 ml Prostat po TID, each supplement provides 100 kcal and 15 grams of protein.   Encourage adequate PO intake.   Corrin Parker, MS, RD, LDN Pager # [phone] After hours/ weekend pager # [phone]

## 2016-10-16 NOTE — Progress Notes (Signed)
Patient ID: Deborah [name], female   DOB: December 19, 1955, 60 y.o.   MRN: [id number]  [facility] Progress Note   Subjective:    Still reports cough that is making it hard to breathe.  Son and daughter visiting tonight.      Objective:   BP 108/75 (BP Location: Right Arm)   Pulse 96   Temp 98.7 F (37.1 C) (Oral)   Resp (!) 22   Ht 5\' 1"  (1.549 m)   Wt 56.7 kg (125 lb)   SpO2 98%   BMI 23.62 kg/m   Physical Exam: VS as noted NAD Prev trach site closed, + dry cough with inspiratory upper airway sounds Chest is clear anteriorly S1S2 No S3 Healed sternotomy scar Abd soft and not tender No edema of LE's Left upper arm AVG + bruit (10/30) Left sided TDC (9/28 VVS)   Recent Labs Lab 10/11/16 0348 10/11/16 1720 10/12/16 8882 10/13/16 0538 10/14/16 0411 10/15/16 0319 10/15/16 0735 10/16/16 0607  NA 133* 129* 130* 132* 132* 135 134* 131*  K 3.4* 4.6 4.2 3.9 4.0 3.6 3.8 4.3  CL 94* 93* 94* 95* 94* 95* 95* 94*  CO2 26 23 23 24 24 25 23 23   GLUCOSE 53* 195* 146* 143* 212* 93 134* 167*  BUN 22* 27* 32* 12 25* 34* 5* 21*  CREATININE 3.44* 4.20* 5.04* 2.74* 4.41* 5.76* 2.05* 4.01*  CALCIUM 8.5* 8.4* 8.5* 8.1* 8.5* 8.5* 8.5* 8.6*  PHOS 5.3*  --  7.5* 5.9* 8.7* 9.2* 3.1 5.4*   Iron/TIBC/Ferritin/ %Sat    Component Value Date/Time   IRON 22 (L) 10/10/2016 0356   TIBC 326 10/10/2016 0356   FERRITIN 272 10/10/2016 0356   IRONPCTSAT 7 (L) 10/10/2016 0356   Results for KYNNEDY, CARRENO (MRN [id number]) as of 10/12/2016 08:46  Ref. Range 10/10/2016 03:56  PTH Latest Ref Range: 15 - 65 pg/mL 199 (H)    Recent Labs Lab 10/10/16 0356 10/11/16 0348 10/12/16 0628  WBC 18.7* 15.1* 15.0*  NEUTROABS  --   --  11.1*  HGB 11.1* 10.3* 10.5*  HCT 34.6* 33.5* 33.2*  MCV 86.7 87.9 86.7  PLT 529* 475* 481*    Lab Results  Component Value Date   INR 4.30 (HH) 10/16/2016   INR 3.29 10/15/2016   INR 4.12 (HH) 10/14/2016    Medications:    . amiodarone  200 mg  Oral BID  . atorvastatin  20 mg Oral q1800  . clopidogrel  75 mg Oral Daily  . [START ON 10/17/2016] darbepoetin (ARANESP) injection - DIALYSIS  100 mcg Intravenous Q Tue-HD  . famotidine  20 mg Oral Daily  . feeding supplement  1 Container Oral TID BM  . feeding supplement (PRO-STAT SUGAR FREE 64)  30 mL Oral TID  . ferric gluconate (FERRLECIT/NULECIT) IV  250 mg Intravenous Q T,Th,Sa-HD  . Gerhardt's butt cream   Topical BID  . glycopyrrolate  1 mg Per Tube BID  . guaiFENesin  600 mg Oral Q6H  . hydrocortisone   Rectal BID  . insulin aspart  0-5 Units Subcutaneous QHS  . insulin aspart  0-9 Units Subcutaneous TID WC  . insulin detemir  9 Units Subcutaneous BID  . lanthanum  1,000 mg Oral TID WC  . metoCLOPramide  5 mg Oral TID AC  . midodrine  10 mg Oral q morning - 10a  . multivitamin  1 tablet Oral QHS  . multivitamin  15 mL Per Tube Daily  . pantoprazole  20 mg Oral BID  . sodium chloride flush  10-40 mL Intracatheter Q12H  . Warfarin - Pharmacist Dosing Inpatient   Does not apply q1800   Background: 60 y/o AAF with DM2, HTN, HLD presented to [facility] ER with infposterior STEMI on 9/12->cath->. PCS with stent x 2 by Dr. Clayborn Bigness. EF 55-60% with severe MR.  Post-cath respiratory failure ->intubation. Shock and renal failure (creatinine 1.6 on adm). Trialysis catheter placed 9/14 and pt had CRRT at [facility] for 48 hours prior to transfer here on 08/18/16->back to cath lab -> ruptured posterior papillary muscle with severe MR->IABP/[place]. We were asked to see d/t progressive rise in creatinine/volume overload/anuric AKI .  CRRT re-initiated 08/21/16-9/20, then 9/21 post MVR. Transitioned to IHD.  Dialysis dependent since 08/17/16. No recovery. New ESRD.  Assessment/ Plan:    1. 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 Amio. Coumadin. INR supratherapeutic. Pharmacy managing. Using midodrine for BP.  2. ESRD: Dialysis dependent since  08/17/16.  TTS schedule via Left TDC (9/28 Scot Dock). S/P LUA AVG placed on 10/30 (brachioaxillary) by Dr. Donzetta Matters - will be ready by Nov 30. EDW looks to be about 57 kg. (outpt has spot [place]art TTS)   3. Anemia: 2/2  ESRD/Fe def - on darbe 100 QTuesday. TSat 7%. Fe load with HD. Last Hb 10.5. 4. CKD-MBD: Phos high. Started lanthanum. Phos has been all over the place. Suspect not getting the binder with her food. Increased to 2 gm ac. PTH 199 no intervention needed yet. 2Ca bath with HD. Follow labs w/HD 5. Nutrition: Low albumin secondary to critical illness/prolonged hospitalization, (Nepro/continue renal vitamin)  Cough/intermittent dyspnea: Last CXR 11/5 clear, no edema. I presume aspiration has been the concern. On Dysphagia diet.  Has some inspiratory upper airway sounds.  ? Tracheal stenosis or tracheomalacia? 6. Disposition - Cont rehab. Eventual home, HD at [place], [place] Cell [phone] Pager 10/16/2016, 8:29 PM

## 2016-10-17 ENCOUNTER — Inpatient Hospital Stay (HOSPITAL_COMMUNITY): Payer: Medicaid Other | Admitting: Physical Therapy

## 2016-10-17 ENCOUNTER — Inpatient Hospital Stay (HOSPITAL_COMMUNITY): Payer: Self-pay | Admitting: Occupational Therapy

## 2016-10-17 ENCOUNTER — Inpatient Hospital Stay (HOSPITAL_COMMUNITY): Payer: Medicaid Other | Admitting: Speech Pathology

## 2016-10-17 LAB — CBC WITH DIFFERENTIAL/PLATELET
BASOS ABS: 0 10*3/uL (ref 0.0–0.1)
BASOS PCT: 0 %
EOS ABS: 0.2 10*3/uL (ref 0.0–0.7)
EOS PCT: 1 %
HCT: 33.3 % — ABNORMAL LOW (ref 36.0–46.0)
Hemoglobin: 10.5 g/dL — ABNORMAL LOW (ref 12.0–15.0)
Lymphocytes Relative: 12 %
Lymphs Abs: 1.8 10*3/uL (ref 0.7–4.0)
MCH: 27.2 pg (ref 26.0–34.0)
MCHC: 31.5 g/dL (ref 30.0–36.0)
MCV: 86.3 fL (ref 78.0–100.0)
Monocytes Absolute: 1.1 10*3/uL — ABNORMAL HIGH (ref 0.1–1.0)
Monocytes Relative: 7 %
NEUTROS PCT: 80 %
Neutro Abs: 12.1 10*3/uL — ABNORMAL HIGH (ref 1.7–7.7)
PLATELETS: 498 10*3/uL — AB (ref 150–400)
RBC: 3.86 MIL/uL — AB (ref 3.87–5.11)
RDW: 16 % — ABNORMAL HIGH (ref 11.5–15.5)
WBC: 15.2 10*3/uL — AB (ref 4.0–10.5)

## 2016-10-17 LAB — RENAL FUNCTION PANEL
Albumin: 2.8 g/dL — ABNORMAL LOW (ref 3.5–5.0)
Anion gap: 15 (ref 5–15)
BUN: 37 mg/dL — AB (ref 6–20)
CALCIUM: 8.5 mg/dL — AB (ref 8.9–10.3)
CHLORIDE: 93 mmol/L — AB (ref 101–111)
CO2: 24 mmol/L (ref 22–32)
CREATININE: 5.38 mg/dL — AB (ref 0.44–1.00)
GFR, EST AFRICAN AMERICAN: 9 mL/min — AB (ref >60)
GFR, EST NON AFRICAN AMERICAN: 8 mL/min — AB (ref >60)
Glucose, Bld: 84 mg/dL (ref 65–99)
Phosphorus: 6.2 mg/dL — ABNORMAL HIGH (ref 2.5–4.6)
Potassium: 4.3 mmol/L (ref 3.5–5.1)
SODIUM: 132 mmol/L — AB (ref 135–145)

## 2016-10-17 LAB — PROTIME-INR
INR: 5.06
INR: 5.18 — AB
PROTHROMBIN TIME: 48.6 s — AB (ref 11.4–15.2)
PROTHROMBIN TIME: 49.2 s — AB (ref 11.4–15.2)

## 2016-10-17 LAB — COMPREHENSIVE METABOLIC PANEL
ALT: 10 U/L — AB (ref 14–54)
AST: 14 U/L — AB (ref 15–41)
Albumin: 2.8 g/dL — ABNORMAL LOW (ref 3.5–5.0)
Alkaline Phosphatase: 81 U/L (ref 38–126)
Anion gap: 13 (ref 5–15)
BILIRUBIN TOTAL: 1.5 mg/dL — AB (ref 0.3–1.2)
BUN: 38 mg/dL — AB (ref 6–20)
CO2: 25 mmol/L (ref 22–32)
CREATININE: 5.79 mg/dL — AB (ref 0.44–1.00)
Calcium: 8.5 mg/dL — ABNORMAL LOW (ref 8.9–10.3)
Chloride: 95 mmol/L — ABNORMAL LOW (ref 101–111)
GFR, EST AFRICAN AMERICAN: 8 mL/min — AB (ref >60)
GFR, EST NON AFRICAN AMERICAN: 7 mL/min — AB (ref >60)
Glucose, Bld: 79 mg/dL (ref 65–99)
Potassium: 4.2 mmol/L (ref 3.5–5.1)
Sodium: 133 mmol/L — ABNORMAL LOW (ref 135–145)
TOTAL PROTEIN: 6.7 g/dL (ref 6.5–8.1)

## 2016-10-17 LAB — GLUCOSE, CAPILLARY
GLUCOSE-CAPILLARY: 79 mg/dL (ref 65–99)
GLUCOSE-CAPILLARY: 123 mg/dL — AB (ref 65–99)
GLUCOSE-CAPILLARY: 102 mg/dL — AB (ref 65–99)

## 2016-10-17 MED ORDER — ALBUTEROL SULFATE (2.5 MG/3ML) 0.083% IN NEBU
2.5000 mg | INHALATION_SOLUTION | RESPIRATORY_TRACT | Status: DC | PRN
  Administered 2016-10-17 – 2016-10-22 (×4): 2.5 mg via RESPIRATORY_TRACT
  Filled 2016-10-17 (×5): qty 3

## 2016-10-17 MED ORDER — DARBEPOETIN ALFA 100 MCG/0.5ML IJ SOSY
100.0000 ug | PREFILLED_SYRINGE | INTRAMUSCULAR | Status: DC
  Administered 2016-10-17 (×2): 100 ug via INTRAVENOUS

## 2016-10-17 MED ORDER — GLYCOPYRROLATE 1 MG PO TABS
1.0000 mg | ORAL_TABLET | Freq: Every day | ORAL | Status: DC
  Administered 2016-10-18 – 2016-10-19 (×2): 1 mg
  Filled 2016-10-17 (×3): qty 1

## 2016-10-17 MED ORDER — METOCLOPRAMIDE HCL 5 MG PO TABS
5.0000 mg | ORAL_TABLET | Freq: Three times a day (TID) | ORAL | Status: DC
  Administered 2016-10-17 – 2016-10-23 (×19): 5 mg via ORAL
  Filled 2016-10-17 (×20): qty 1

## 2016-10-17 MED ORDER — GABAPENTIN 100 MG PO CAPS: 100.0000 mg | ORAL_CAPSULE | Freq: Two times a day (BID) | ORAL | Status: DC

## 2016-10-17 MED ORDER — DARBEPOETIN ALFA 100 MCG/0.5ML IJ SOSY
PREFILLED_SYRINGE | INTRAMUSCULAR | Status: AC
  Administered 2016-10-17: 100 ug via INTRAVENOUS
  Filled 2016-10-17: qty 0.5

## 2016-10-17 MED ORDER — GABAPENTIN 300 MG PO CAPS
300.0000 mg | ORAL_CAPSULE | Freq: Every day | ORAL | Status: DC
  Administered 2016-10-17 – 2016-10-30 (×14): 300 mg via ORAL
  Filled 2016-10-17 (×14): qty 1

## 2016-10-17 MED ORDER — METOCLOPRAMIDE HCL 5 MG PO TABS: 5.0000 mg | ORAL_TABLET | Freq: Two times a day (BID) | ORAL | Status: DC

## 2016-10-17 MED ORDER — INSULIN DETEMIR 100 UNIT/ML ~~LOC~~ SOLN
8.0000 [IU] | Freq: Two times a day (BID) | SUBCUTANEOUS | Status: DC
  Administered 2016-10-18 – 2016-10-20 (×4): 8 [IU] via SUBCUTANEOUS
  Filled 2016-10-17 (×8): qty 0.08

## 2016-10-17 NOTE — Progress Notes (Signed)
ANTICOAGULATION CONSULT NOTE - Follow Up Consult  Pharmacy Consult for coumadin Indication: a flutter  No Known Allergies  Patient Measurements: Height: 5\' 1"  (154.9 cm) Weight: 125 lb (56.7 kg) IBW/kg (Calculated) : 47.8 Heparin Dosing Weight:   Vital Signs: Temp: 98.5 F (36.9 C) (11/14 0538) Temp Source: Oral (11/14 0538) BP: 104/65 (11/14 0538) Pulse Rate: 93 (11/14 0538)  Labs:  Recent Labs  10/16/16 0607 10/17/16 0455 10/17/16 0603  HGB  --   --  10.5*  HCT  --   --  33.3*  PLT  --   --  498*  LABPROT 42.4* 48.6* 49.2*  INR 4.30* 5.06* 5.18*  CREATININE 4.01* 5.38* 5.79*    Estimated Creatinine Clearance: 7.9 mL/min (by C-G formula based on SCr of 5.79 mg/dL (H)).   Medications:  Scheduled:  . amiodarone  200 mg Oral BID  . atorvastatin  20 mg Oral q1800  . clopidogrel  75 mg Oral Daily  . darbepoetin (ARANESP) injection - DIALYSIS  100 mcg Intravenous Q Tue-HD  . famotidine  20 mg Oral Daily  . feeding supplement  1 Container Oral TID BM  . feeding supplement (PRO-STAT SUGAR FREE 64)  30 mL Oral TID  . ferric gluconate (FERRLECIT/NULECIT) IV  250 mg Intravenous Q T,Th,Sa-HD  . Gerhardt's butt cream   Topical BID  . glycopyrrolate  1 mg Per Tube BID  . guaiFENesin  600 mg Oral Q6H  . hydrocortisone   Rectal BID  . insulin aspart  0-5 Units Subcutaneous QHS  . insulin aspart  0-9 Units Subcutaneous TID WC  . insulin detemir  9 Units Subcutaneous BID  . lanthanum  1,000 mg Oral TID WC  . metoCLOPramide  5 mg Oral TID AC  . midodrine  10 mg Oral q morning - 10a  . multivitamin  1 tablet Oral QHS  . multivitamin  15 mL Per Tube Daily  . pantoprazole  20 mg Oral BID  . sodium chloride flush  10-40 mL Intracatheter Q12H  . Warfarin - Pharmacist Dosing Inpatient   Does not apply q1800   Infusions:    Assessment: 60 yo female with aflutter is currently on supratherapeutic coumadin.  INR is up to 5.18.  Last coumadin dose was 11/07.  Goal of Therapy:   INR 2 - 2.5 per MD Monitor platelets by anticoagulation protocol: Yes   Plan:  - NO coumadin tonight - INR in am - Consider vitamin K to help reverse if bleeding occcurs  Keenan Trefry, Tsz-Yin 10/17/2016,8:23 AM

## 2016-10-17 NOTE — Procedures (Signed)
Patient seen and examined on Hemodialysis.B 400 R TDC UF goal 1l.  Tolerating treatment well. Treatment adjusted as needed.  Madelon Lips MD [facility]. Cell [phone] Pager # [phone] 4:26 PM

## 2016-10-17 NOTE — Progress Notes (Signed)
CRITICAL VALUE ALERT  Critical value received: INR 5.06  Date of notification: 10-17-16 Time of notification:  5am Critical value read back:Yes.    Nurse who received alert:  Mellody Memos  MD notified (1st page):Pam Love   Time of first page:  5:01 MD notified (2nd page):  Time of second page:  Responding MD:  Algis Liming   Time MD responded:  5:01

## 2016-10-17 NOTE — Progress Notes (Signed)
Patient with episode of coughing and regurgitation and increase WOB due to anxiety. Will increase Reglan back to TID ac/hs.  Robitussin has been scheduled qid since admission--dose increased.    Discussed patient with Dr. Rebecca Eaton recommended addition of neurontin 300 mg tid for cough and to follow up in office. Neurontin adjusted to 300 mg HS due to ESRD.

## 2016-10-17 NOTE — Progress Notes (Signed)
PHYSICAL MEDICINE & REHABILITATION     PROGRESS NOTE  Subjective/Complaints:  Pt sitting up in bed eating breakfast.  She slept well "okay" overnight.  She continues to have her cough.  ROS: +Cough. Denies CP, SOB, N/V/D.  Objective: Vital Signs: Blood pressure 104/65, pulse 93, temperature 98.5 F (36.9 C), temperature source Oral, resp. rate 20, height 5\' 1"  (1.549 m), weight 56.7 kg (125 lb), SpO2 97 %. No results found.  Recent Labs  10/17/16 0603  WBC 15.2*  HGB 10.5*  HCT 33.3*  PLT 498*    Recent Labs  10/17/16 0455 10/17/16 0603  NA 132* 133*  K 4.3 4.2  CL 93* 95*  GLUCOSE 84 79  BUN 37* 38*  CREATININE 5.38* 5.79*  CALCIUM 8.5* 8.5*   CBG (last 3)   Recent Labs  10/16/16 1704 10/16/16 2116 10/17/16 0646  GLUCAP 73 116* 79    Wt Readings from Last 3 Encounters:  10/15/16 56.7 kg (125 lb)  10/11/16 58.1 kg (128 lb 1.4 oz)  08/18/16 71.2 kg (156 lb 15.5 oz)    Physical Exam:  BP 104/65 (BP Location: Right Arm)   Pulse 93   Temp 98.5 F (36.9 C) (Oral)   Resp 20   Ht 5\' 1"  (1.549 m)   Wt 56.7 kg (125 lb)   SpO2 97%   BMI 23.62 kg/m  Constitutional:  She appears well-developed and well-nourished. NAD. HENT: Normocephalic and atraumatic.  Eyes: EOM are normal. No discharge.  Cardiovascular: RRR. No JVD. Respiratory: +Cough. Effort normal otherwise. She has no wheezes. GI: Soft. Bowel sounds are normal. She exhibits no distension.  Musculoskeletal: She exhibits tenderness (LUE-AVG). She exhibits no edema.  Neurological: She is alert and oriented.  Dysphonic, stable Motor: 4+/5 proximally, 4+/5 distally Skin: Skin is warm and dry. She is not diaphoretic. No erythema.  Incision healing Psychiatric: Her affect is blunt. She is slowed, improving.    Assessment/Plan: 1. Functional deficits secondary to debility from multiply cardial and pulmonary issues which require 3+ hours per day of interdisciplinary therapy in a comprehensive  inpatient rehab setting. Physiatrist is providing close team supervision and 24 hour management of active medical problems listed below. Physiatrist and rehab team continue to assess barriers to discharge/monitor patient progress toward functional and medical goals.  Function:  Bathing Bathing position   Position: Wheelchair/chair at sink  Bathing parts Body parts bathed by patient: Right arm, Left arm, Chest, Abdomen, Right upper leg, Left upper leg, Front perineal area, Left lower leg Body parts bathed by helper: Right lower leg, Buttocks  Bathing assist Assist Level: Touching or steadying assistance(Pt > 75%)      Upper Body Dressing/Undressing Upper body dressing   What is the patient wearing?: Bra, Pull over shirt/dress Bra - Perfomed by patient: Thread/unthread right bra strap, Thread/unthread left bra strap Bra - Perfomed by helper: Hook/unhook bra (pull down sports bra) Pull over shirt/dress - Perfomed by patient: Thread/unthread right sleeve, Thread/unthread left sleeve, Put head through opening Pull over shirt/dress - Perfomed by helper: Pull shirt over trunk        Upper body assist Assist Level: Touching or steadying assistance(Pt > 75%)      Lower Body Dressing/Undressing Lower body dressing   What is the patient wearing?: Pants, Non-skid slipper socks     Pants- Performed by patient: Pull pants up/down, Thread/unthread right pants leg Pants- Performed by helper: Thread/unthread left pants leg Non-skid slipper socks- Performed by patient: Don/doff right sock Non-skid  slipper socks- Performed by helper: Don/doff left sock                  Lower body assist Assist for lower body dressing: Touching or steadying assistance (Pt > 75%)      Toileting Toileting Toileting activity did not occur: No continent bowel/bladder event   Toileting steps completed by helper: Performs perineal hygiene, Adjust clothing prior to toileting, Adjust clothing after toileting     Toileting assist Assist level: Touching or steadying assistance (Pt.75%)   Transfers Chair/bed transfer Chair/bed transfer activity did not occur: Safety/medical concerns Chair/bed transfer method: Stand pivot Chair/bed transfer assist level: Touching or steadying assistance (Pt > 75%) Chair/bed transfer assistive device: Armrests, Medical sales representative     Max distance: 50' Assist level: Touching or steadying assistance (Pt > 75%)   Wheelchair   Type: Manual Max wheelchair distance: 100 ft Assist Level: Supervision or verbal cues  Cognition Comprehension Comprehension assist level: Understands basic 90% of the time/cues < 10% of the time  Expression Expression assist level: Expresses basic 75 - 89% of the time/requires cueing 10 - 24% of the time. Needs helper to occlude trach/needs to repeat words.  Social Interaction Social Interaction assist level: Interacts appropriately 90% of the time - Needs monitoring or encouragement for participation or interaction.  Problem Solving Problem solving assist level: Solves basic 50 - 74% of the time/requires cueing 25 - 49% of the time  Memory Memory assist level: Recognizes or recalls 50 - 74% of the time/requires cueing 25 - 49% of the time    Medical Problem List and Plan: 1.  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  Cont CIR 2.  DVT Prophylaxis/Anticoagulation: Pharmaceutical: Coumadin  INR supra therapeutic 11/14 (trending up), appreciate Pharmacy recs 3. Pain Management: Hydrocodone prn effective. 4. Mood: LCSW to follow for evaluation and support.  5. Neuropsych: This patient is not fully capable of making decisions on her own behalf. 6. Skin/Wound Care: Routine pressure relief measures.  7. Fluids/Electrolytes/Nutrition: Strict I/O. Renal panel TTSa with HD.   D3, thin diet, advance diet as tolerated  Weaned reglan 11/13, again on  11/14, will cont as tolerated 8. Leucocytosis:   Cont cipro for bronchitis   WBCs 15.2 on 11/14  Labs with HD 10 T2DM:Hgb A1c- 13.3.   Levemir increased to 7U bid on 11/10, increased to [place] on 11/13, will decrease to 8U on 11/14  Will monitor blood sugars ac/hs with SSI for elevated BS   CBG (last 3)   Recent Labs  10/16/16 1704 10/16/16 2116 10/17/16 0646  GLUCAP 73 116* 79    11. ESRD: Now HD dependent--to schedule HD in afternoons to help with tolerance of therapy during the day. Educate patient and family on renal diet. Electrolyte abnormalities being managed during HD.  12. Anemia of chronic disease:   Hb 10.5 on 11/14   Monitor for signs of bleeding.   On aranesp for erythropoiesis and nulecit for iron load  Labs with HD 13. CAD s/p DES X 2 and MVR: Monitor for symptoms with increase in activity. On plavix and Lipitor.  14. Acute respiratory failure due to acute pulmonary edema:  Fluid overload compensated with HD.  15. AFlutter s/p DCCV/PAF: HR controlled on amiodarone. On coumadin.  16. Persistent cough with dysphonia: Off antibiotics. Repeat CXR 11/12 unremarkable for acute process  Cont neb  Continue robinul for secretions and Robitussin DM  for cough, will consider weaning when appropriate  Encourage IS with use of flutter valve  Will consult ENT  17. Sleep disturbance: managed with benadryl prn  LOS (Days) 6 A FACE TO FACE EVALUATION WAS PERFORMED  Ankit Lorie Phenix 10/17/2016 8:34 AM

## 2016-10-17 NOTE — Progress Notes (Signed)
CRITICAL VALUE ALERT  Critical value received:  INR 5.18 Date of notification:  10-17-16 Time of notification: 7:15  Critical value read back:Yes.    Nurse who received alert:  Mellody Memos  MD notified (1st page): Marlowe Shores (spoken to by Velta Addison B.) Time of first page:  7:20  MD notified (2nd page):  Time of second page:  Responding MD:  Marlowe Shores Time MD responded:

## 2016-10-17 NOTE — Progress Notes (Signed)
Recreational Therapy Session Note  Patient Details  Name: Deborah [name] MRN: [id number] Date of Birth: 1956/06/14 Today's Date: 10/17/2016   Order received and chart reviewed.  Per discussion with team, pt not yet appropriate for TR services.  Pt is placed on HOLD, will continue to monitor for appropriateness. Guilherme Schwenke 10/17/2016, 2:34 PM

## 2016-10-17 NOTE — Progress Notes (Signed)
Patient ID: Deborah [name], female   DOB: 1955/12/24, 60 y.o.   MRN: [id number]  [facility] Progress Note   Subjective:    Sleepy this afternoon    Objective:   BP (!) 93/50 (BP Location: Right Arm)   Pulse 90   Temp 98.4 F (36.9 C) (Oral)   Resp 18   Ht 5\' 1"  (1.549 m)   Wt 56.9 kg (125 lb 7.1 oz)   SpO2 96%   BMI 23.70 kg/m   Physical Exam: VS as noted NAD Prev trach site closed, + dry cough with inspiratory upper airway sounds Chest is clear anteriorly, muffled at bases S1S2 No S3 Healed sternotomy scar Abd soft and not tender No edema of LE's Left upper arm AVG + bruit (10/30) Left sided TDC (9/28 VVS)   Recent Labs Lab 10/12/16 3154 10/13/16 0538 10/14/16 0411 10/15/16 0319 10/15/16 0735 10/16/16 0607 10/17/16 0455 10/17/16 0603  NA 130* 132* 132* 135 134* 131* 132* 133*  K 4.2 3.9 4.0 3.6 3.8 4.3 4.3 4.2  CL 94* 95* 94* 95* 95* 94* 93* 95*  CO2 23 24 24 25 23 23 24 25   GLUCOSE 146* 143* 212* 93 134* 167* 84 79  BUN 32* 12 25* 34* 5* 21* 37* 38*  CREATININE 5.04* 2.74* 4.41* 5.76* 2.05* 4.01* 5.38* 5.79*  CALCIUM 8.5* 8.1* 8.5* 8.5* 8.5* 8.6* 8.5* 8.5*  PHOS 7.5* 5.9* 8.7* 9.2* 3.1 5.4* 6.2*  --    Iron/TIBC/Ferritin/ %Sat    Component Value Date/Time   IRON 22 (L) 10/10/2016 0356   TIBC 326 10/10/2016 0356   FERRITIN 272 10/10/2016 0356   IRONPCTSAT 7 (L) 10/10/2016 0356   Results for Deborah [name] (MRN [id number]) as of 10/12/2016 08:46  Ref. Range 10/10/2016 03:56  PTH Latest Ref Range: 15 - 65 pg/mL 199 (H)    Recent Labs Lab 10/11/16 0348 10/12/16 0628 10/17/16 0603  WBC 15.1* 15.0* 15.2*  NEUTROABS  --  11.1* 12.1*  HGB 10.3* 10.5* 10.5*  HCT 33.5* 33.2* 33.3*  MCV 87.9 86.7 86.3  PLT 475* 481* 498*    Lab Results  Component Value Date   INR 5.18 (HH) 10/17/2016   INR 5.06 (HH) 10/17/2016   INR 4.30 (HH) 10/16/2016    Medications:    . amiodarone  200 mg Oral BID  . atorvastatin  20 mg Oral q1800   . clopidogrel  75 mg Oral Daily  . famotidine  20 mg Oral Daily  . feeding supplement  1 Container Oral TID BM  . feeding supplement (PRO-STAT SUGAR FREE 64)  30 mL Oral TID  . ferric gluconate (FERRLECIT/NULECIT) IV  250 mg Intravenous Q T,Th,Sa-HD  . gabapentin  300 mg Oral QHS  . Gerhardt's butt cream   Topical BID  . [START ON 10/18/2016] glycopyrrolate  1 mg Per Tube Q breakfast  . guaiFENesin  600 mg Oral Q6H  . hydrocortisone   Rectal BID  . insulin aspart  0-5 Units Subcutaneous QHS  . insulin aspart  0-9 Units Subcutaneous TID WC  . insulin detemir  8 Units Subcutaneous BID  . lanthanum  1,000 mg Oral TID WC  . metoCLOPramide  5 mg Oral TID AC & HS  . midodrine  10 mg Oral q morning - 10a  . multivitamin  1 tablet Oral QHS  . pantoprazole  20 mg Oral BID  . sodium chloride flush  10-40 mL Intracatheter Q12H  . Warfarin - Pharmacist Dosing Inpatient  Does not apply q1800   Background: 60 y/o AAF with DM2, HTN, HLD presented to [facility] ER with infposterior STEMI on 9/12->cath->. PCS with stent x 2 by Dr. Clayborn Bigness. EF 55-60% with severe MR.  Post-cath respiratory failure ->intubation. Shock and renal failure (creatinine 1.6 on adm). Trialysis catheter placed 9/14 and pt had CRRT at [facility] for 48 hours prior to transfer here on 08/18/16->back to cath lab -> ruptured posterior papillary muscle with severe MR->IABP/Swan. We were asked to see d/t progressive rise in creatinine/volume overload/anuric AKI .  CRRT re-initiated 08/21/16-9/20, then 9/21 post MVR. Transitioned to IHD.  Dialysis dependent since 08/17/16. No recovery. New ESRD.  Assessment/ Plan:    1. 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 Amio. Coumadin. INR supratherapeutic. Pharmacy managing. Using midodrine for BP.  2. 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  TTS schedule via Left TDC (9/28 Scot Dock). S/P LUA AVG placed on 10/30 (brachioaxillary) by Dr. Donzetta Matters - will be  ready by Nov 30. EDW looks to be about 57 kg. (outpt has spot [place] TTS)   3. Anemia: 2/2  ESRD/Fe def - on darbe 100 QTuesday. TSat 7%. Fe load with HD. Last Hb 10.5. 4. CKD-MBD: Phos high. Started lanthanum. Phos has been all over the place. Suspect not getting the binder with her food. Increased to 2 gm ac. PTH 199 no intervention needed yet. 2Ca bath with HD. Follow labs w/HD 5. Nutrition: Low albumin secondary to critical illness/prolonged hospitalization, (Nepro/continue renal vitamin)  Cough/intermittent dyspnea: Last CXR 11/5 clear, no edema. I presume aspiration has been the concern. On Dysphagia diet.  Has some inspiratory upper airway sounds.  ? Tracheal stenosis or tracheomalacia? 6. Disposition - Cont rehab. Eventual home, HD at [place], [place] Cell [phone] Pager 10/17/2016, 4:24 PM

## 2016-10-17 NOTE — Progress Notes (Signed)
Speech Language Pathology Note  Patient Details  Name: Deborah [name] MRN: [id number] Date of Birth: 08-Dec-1955 Today's Date: 10/17/2016  Nurse tech expressed concern with coughing during PO intake.  Chart reviewed, which revealed chronic cough that was documented as not indicative of aspiration per MBS.  I have discussed with Dr. Posey Pronto and agreed that a diet downgrade to Dys.2 textures may be helpful to decreased timeliness of oral phase and overall work of breathing during PO.  Orders modified to reflect change in plan of care.  Also note that we are awaiting ENT consult for further recommendations.       Gunnar Fusi, M.A., CCC-SLP [phone]  [place] 10/17/2016, 9:05 AM

## 2016-10-17 NOTE — Progress Notes (Signed)
Physical Therapy Session Note  Patient Details  Name: Deborah [name] MRN: [id number] Date of Birth: 1956-05-30  Today's Date: 10/17/2016 PT Individual Time: 9381-8299 and 3716-9678 PT Individual Time Calculation (min): 26 min and 38 min   Short Term Goals: Week 1:  PT Short Term Goal 1 (Week 1): Patient will perform transfers with min A.  PT Short Term Goal 2 (Week 1): Patient will ambulate 75 ft with min A.  PT Short Term Goal 3 (Week 1): Patient will maintain dynamic standing balance x 1 min with min A.  PT Short Term Goal 4 (Week 1): Patient will negotiate up/down 4 stairs using L rail only with min A.  PT Short Term Goal 5 (Week 1): Patient will propel wheelchair x 150 ft with supervision for strengthening.   Skilled Therapeutic Interventions/Progress Updates:    Treatment 1: Patient sitting up in bed eating soup with nurse tech present. Patient with continuous cough with wet quality and threw up some of lunch. Patient's RN arrived due to coughing episode and PA-C Pam Love notified and present to assess patient. Patient's cough improved slightly after stopping meal with cues for deep breathing but when patient attempted to sit EOB she had an increase in coughing and returned to bed. Patient unable to participate in therapy due to coughing and left in bed with son and RN present, will f/u as able.    Treatment 2: Patient asleep in bed with son departing at beginning of session. Patient reports feeling a little better and sat EOB with supervision and increased time. Session focused on strengthening and activity tolerance with sit <> stand transfers using RW and cues for hand placement, propelling wheelchair using BUE to and from gym with supervision, stair training up/down 4 (6") stairs using 2 rails x 2 with min A, gait training using RW 2 x 60 ft with min A overall, and seated BLE therex for LAQ, alt hip flexion, and heel raises x 10 each LE. Patient returned to bed and left semi reclined  with all needs within reach.   Therapy Documentation Precautions:  Precautions Precautions: Fall Restrictions Weight Bearing Restrictions: No Pain: Pain Assessment Pain Assessment: Faces Faces Pain Scale: Hurts little more Pain Type: Acute pain Pain Location: Throat Pain Descriptors / Indicators: Sore Pain Onset: On-going Pain Intervention(s): RN made aware   See Function Navigator for Current Functional Status.   Therapy/Group: Individual Therapy  Laretta Alstrom 10/17/2016, 2:36 PM

## 2016-10-17 NOTE — Progress Notes (Signed)
Speech Language Pathology Daily Session Note  Patient Details  Name: Deborah [name] MRN: [id number] Date of Birth: 01/31/1956  Today's Date: 10/17/2016 SLP Individual Time: 1100-3496 SLP Individual Time Calculation (min): 57 min   Short Term Goals: Week 1: SLP Short Term Goal 1 (Week 1): Pt will selectively attend to tasks in a mildly distracting environment for 5-7 minutes with supervision verbal cues for redirection SLP Short Term Goal 2 (Week 1): Pt will complete basic familiar tasks with supervision verbal cues for functional problem solving.  SLP Short Term Goal 3 (Week 1): Pt will recall new information with supervision verbal cues for use of external aids.  SLP Short Term Goal 4 (Week 1): Pt will return demonstration of vocal hygiene techniques with min assist verbal cues.   SLP Short Term Goal 5 (Week 1): Pt will observe swallow precautions with PO intake at mod I level.  SLP Short Term Goal 6 (Week 1): Pt will tolerate trials of regular with oral management WFL at mod I.  Skilled Therapeutic Interventions:Skilled therapy intervention focused on dysphagia and cognitive goals. Patient independently demonstrated chin tuck and small sips with water from a cup; intermittant coughing throughout session appears unrelated to PO intake. Patient diet downgraded to Dys. 2 to increase patient comfort level with eating while her intermittant cough persists. Patient educated on vocal hygiene, including daily water consumption; RN consulted to ensure compliance with renal diet restrictions. Patient required Min A question cues to recall alternating sips/bites swallowing strategy. Patient required Min A verbal cues for problem solving during functional shopping task. When asked at end of session to repeat back 3 components of vocal hygiene education, patient required Max A verbal cues for short term recall. Patient left upright in bed with bed alarm on and all needs within reach. Continue current plan  of care.    Function:  Eating Eating   Modified Consistency Diet: Yes Eating Assist Level: Swallowing techniques: self managed           Cognition Comprehension Comprehension assist level: Understands basic 90% of the time/cues < 10% of the time  Expression   Expression assist level: Expresses basic 75 - 89% of the time/requires cueing 10 - 24% of the time. Needs helper to occlude trach/needs to repeat words.  Social Interaction Social Interaction assist level: Interacts appropriately 90% of the time - Needs monitoring or encouragement for participation or interaction.  Problem Solving Problem solving assist level: Solves basic 50 - 74% of the time/requires cueing 25 - 49% of the time  Memory Memory assist level: Recognizes or recalls 25 - 49% of the time/requires cueing 50 - 75% of the time    Pain Pain Assessment Pain Assessment: No/denies pain  Therapy/Group: Individual Therapy  Thornton Papas 10/17/2016, 11:04 AM

## 2016-10-17 NOTE — Progress Notes (Signed)
Occupational Therapy Session Note  Patient Details  Name: Deborah [name] MRN: [id number] Date of Birth: 11-26-1956  Today's Date: 10/17/2016 OT Individual Time: 1000-1115 OT Individual Time Calculation (min): 75 min     Short Term Goals: Week 1:  OT Short Term Goal 1 (Week 1): Pt will tolerate 5 minutes standing at the sink to complete ADL tasks  with close supervision.  OT Short Term Goal 2 (Week 1): Pt will complete LB dressing with Min A OT Short Term Goal 3 (Week 1): Pt will complete toilet transfer with overall Min A OT Short Term Goal 4 (Week 1): Pt will follow 75% commands with min questioning cues.  Skilled Therapeutic Interventions/Progress Updates:    1:1 OT session focused on bathing/dressing modifications, activity tolerance, sit<>stands, functional mobility, and general strengthening. Pt transferred OOB and ambulated with RW to closet to collect clothing with Min A. Pt required seated rest break after ambulation, then completed bathing/dressing sit<>stand from w/c at sink w/ overall Min A. Min A/CGA for sit<>stands, and CGA/intermittent min A t for dynamic standing balance.  Pt then brought to long hallway and completed 20 feet 3x ambulation w/ RW and Min A. Cues for foot clearance with increased fatigue.   Therapy Documentation Precautions:  Precautions Precautions: Fall Restrictions Weight Bearing Restrictions: No Pain: Pain Assessment Pain Assessment: No/denies pain Pain Score: 0-No pain  ADL: ADL ADL Comments: Please see functional navigator  See Function Navigator for Current Functional Status.   Therapy/Group: Individual Therapy  Valma Cava 10/17/2016, 4:19 PM

## 2016-10-17 NOTE — Progress Notes (Signed)
      [address]       [place] [address]             [phone]         Subjective: Still having a cough that is productive. She states her sputum is white.   Objective: Vital signs in last 24 hours: Temp:  [98.5 F (36.9 C)-98.7 F (37.1 C)] 98.5 F (36.9 C) (11/14 0538) Pulse Rate:  [93-96] 93 (11/14 0538) Resp:  [20-22] 20 (11/14 0538) BP: (104-108)/(65-75) 104/65 (11/14 0538) SpO2:  [97 %-98 %] 97 % (11/14 0538)     Intake/Output from previous day: 11/13 0701 - 11/14 0700 In: 420 [P.O.:420] Out: -  Intake/Output this shift: Total I/O In: 120 [P.O.:120] Out: -   General appearance: alert, cooperative and no distress Heart: regular rate and rhythm Lungs: clear to auscultation bilaterally and rhonchi in upper lobes bilaterally Abdomen: soft, non-tender; bowel sounds normal; no masses,  no organomegaly Extremities: extremities normal, atraumatic, no cyanosis or edema Wound: clean and dry  Lab Results:  Recent Labs  10/17/16 0603  WBC 15.2*  HGB 10.5*  HCT 33.3*  PLT 498*   BMET:  Recent Labs  10/17/16 0455 10/17/16 0603  NA 132* 133*  K 4.3 4.2  CL 93* 95*  CO2 24 25  GLUCOSE 84 79  BUN 37* 38*  CREATININE 5.38* 5.79*  CALCIUM 8.5* 8.5*    PT/INR:  Recent Labs  10/17/16 0603  LABPROT 49.2*  INR 5.18*   ABG    Component Value Date/Time   PHART 7.487 (H) 10/04/2016 1430   HCO3 28.4 (H) 10/04/2016 1430   TCO2 26 09/08/2016 0412   ACIDBASEDEF 4.1 (H) 09/02/2016 1850   O2SAT 95.5 10/04/2016 1430   CBG (last 3)   Recent Labs  10/16/16 1704 10/16/16 2116 10/17/16 0646  GLUCAP 73 116* 79    Assessment/Plan:   Continues to make good progress in rehab and feeling stronger and more independent each day Coumadin dosing per pharmacy, INR 5.18, holding coumadin Tolerating a regular diet without issue Still c/o cough but with multiple PRN agents. Perhaps Mucinex may be helpful?    LOS: 6 days    Elgie Collard 10/17/2016

## 2016-10-18 ENCOUNTER — Inpatient Hospital Stay (HOSPITAL_COMMUNITY): Payer: Self-pay | Admitting: Occupational Therapy

## 2016-10-18 ENCOUNTER — Inpatient Hospital Stay (HOSPITAL_COMMUNITY): Payer: Self-pay | Admitting: Physical Therapy

## 2016-10-18 ENCOUNTER — Inpatient Hospital Stay (HOSPITAL_COMMUNITY): Payer: Medicaid Other | Admitting: Physical Therapy

## 2016-10-18 ENCOUNTER — Inpatient Hospital Stay (HOSPITAL_COMMUNITY): Payer: Medicaid Other | Admitting: Speech Pathology

## 2016-10-18 LAB — GLUCOSE, CAPILLARY
GLUCOSE-CAPILLARY: 173 mg/dL — AB (ref 65–99)
Glucose-Capillary: 123 mg/dL — ABNORMAL HIGH (ref 65–99)
Glucose-Capillary: 247 mg/dL — ABNORMAL HIGH (ref 65–99)
Glucose-Capillary: 100 mg/dL — ABNORMAL HIGH (ref 65–99)

## 2016-10-18 LAB — PROTIME-INR
INR: 5.02
Prothrombin Time: 46.7 seconds — ABNORMAL HIGH (ref 11.4–15.2)

## 2016-10-18 LAB — RENAL FUNCTION PANEL
ALBUMIN: 2.8 g/dL — AB (ref 3.5–5.0)
Anion gap: 14 (ref 5–15)
BUN: 12 mg/dL (ref 6–20)
CALCIUM: 8.7 mg/dL — AB (ref 8.9–10.3)
CO2: 26 mmol/L (ref 22–32)
CREATININE: 3.16 mg/dL — AB (ref 0.44–1.00)
Chloride: 91 mmol/L — ABNORMAL LOW (ref 101–111)
GFR calc Af Amer: 17 mL/min — ABNORMAL LOW (ref >60)
GFR calc non Af Amer: 15 mL/min — ABNORMAL LOW (ref >60)
GLUCOSE: 116 mg/dL — AB (ref 65–99)
PHOSPHORUS: 5 mg/dL — AB (ref 2.5–4.6)
Potassium: 4.7 mmol/L (ref 3.5–5.1)
SODIUM: 131 mmol/L — AB (ref 135–145)

## 2016-10-18 LAB — HEPATITIS B SURFACE ANTIGEN: Hepatitis B Surface Ag: NEGATIVE

## 2016-10-18 NOTE — Progress Notes (Signed)
Physical Therapy Session Note  Patient Details  Name: Deborah [name] MRN: [id number] Date of Birth: [birth year]  Today's Date: 10/18/2016 PT Individual Time: 0900-1000 PT Individual Time Calculation (min): 60 min    Short Term Goals: Week 1:  PT Short Term Goal 1 (Week 1): Patient will perform transfers with min A.  PT Short Term Goal 2 (Week 1): Patient will ambulate 75 ft with min A.  PT Short Term Goal 3 (Week 1): Patient will maintain dynamic standing balance x 1 min with min A.  PT Short Term Goal 4 (Week 1): Patient will negotiate up/down 4 stairs using L rail only with min A.  PT Short Term Goal 5 (Week 1): Patient will propel wheelchair x 150 ft with supervision for strengthening.   Skilled Therapeutic Interventions/Progress Updates:    Patient sitting up in bed eating breakfast, handoff from SLP. Patient declined further breakfast and required increased time to sit EOB using rail with HOB raised due to coughing. Patient stating, "I just want to rest," discussed changing plan of care to 15/7 due to decreased activity tolerance and patient in agreement, will discuss with team at conference. Performed LB dressing with mod A. Eventually patient transferred to wheelchair without device with mod A due to posterior lean. Patient propelled wheelchair to gym using BUE with supervision and increased time with cues for efficient technique. Gait training using RW 2 x 50 ft with supervision-min A and max cues for safety using RW especially with turning. Patient continues to require max cues for sit <> stand transfers for managing RW and safe hand placement. Performed NuStep using BUE/BLE at level 3 for 6 trials of 1 min each with prolonged rest between trials and able to tolerate 4 min without rest for total of 10 min with cues for pursed lip breathing. Sp02 on room air >96% throughout session. Patient left semi reclined in bed with bed alarm on and all needs in reach.   Therapy  Documentation Precautions:  Precautions Precautions: Fall Restrictions Weight Bearing Restrictions: No Pain:  Denies pain   See Function Navigator for Current Functional Status.   Therapy/Group: Individual Therapy  Carney Living A 10/18/2016, 10:00 AM

## 2016-10-18 NOTE — Progress Notes (Signed)
Patient ID: Deborah [name], female   DOB: Jan 23, 1956, 60 y.o.   MRN: [id number]  [facility] Progress Note   Subjective:    Seen in this AM--> eating breakfast with SLP    Objective:   BP (!) 100/58 (BP Location: Right Arm)   Pulse 90   Temp 98.9 F (37.2 C) (Oral)   Resp 18   Ht 5\' 1"  (1.549 m)   Wt 56 kg (123 lb 7.3 oz)   SpO2 100%   BMI 23.33 kg/m   Physical Exam: VS as noted NAD, sitting up and eating breakfast Prev trach site closed, + dry cough with inspiratory upper airway sounds Chest is clear anteriorly, muffled at bases S1S2 No S3 Healed sternotomy scar Abd soft and not tender No edema of LE's Left upper arm AVG + bruit (10/30) Left sided TDC (9/28 VVS)   Recent Labs Lab 10/13/16 0538 10/14/16 0411 10/15/16 0319 10/15/16 0735 10/16/16 0607 10/17/16 0455 10/17/16 0603 10/18/16 0630  NA 132* 132* 135 134* 131* 132* 133* 131*  K 3.9 4.0 3.6 3.8 4.3 4.3 4.2 4.7  CL 95* 94* 95* 95* 94* 93* 95* 91*  CO2 24 24 25 23 23 24 25 26   GLUCOSE 143* 212* 93 134* 167* 84 79 116*  BUN 12 25* 34* 5* 21* 37* 38* 12  CREATININE 2.74* 4.41* 5.76* 2.05* 4.01* 5.38* 5.79* 3.16*  CALCIUM 8.1* 8.5* 8.5* 8.5* 8.6* 8.5* 8.5* 8.7*  PHOS 5.9* 8.7* 9.2* 3.1 5.4* 6.2*  --  5.0*   Iron/TIBC/Ferritin/ %Sat    Component Value Date/Time   IRON 22 (L) 10/10/2016 0356   TIBC 326 10/10/2016 0356   FERRITIN 272 10/10/2016 0356   IRONPCTSAT 7 (L) 10/10/2016 0356   Results for Deborah [name], Deborah [name] (MRN [id number]) as of 10/12/2016 08:46  Ref. Range 10/10/2016 03:56  PTH Latest Ref Range: 15 - 65 pg/mL 199 (H)    Recent Labs Lab 10/12/16 0628 10/17/16 0603  WBC 15.0* 15.2*  NEUTROABS 11.1* 12.1*  HGB 10.5* 10.5*  HCT 33.2* 33.3*  MCV 86.7 86.3  PLT 481* 498*    Lab Results  Component Value Date   INR 5.02 (HH) 10/18/2016   INR 5.18 (HH) 10/17/2016   INR 5.06 (HH) 10/17/2016    Medications:    . amiodarone  200 mg Oral BID  . atorvastatin  20 mg Oral  q1800  . clopidogrel  75 mg Oral Daily  . darbepoetin (ARANESP) injection - DIALYSIS  100 mcg Intravenous Q Tue-HD  . famotidine  20 mg Oral Daily  . feeding supplement  1 Container Oral TID BM  . feeding supplement (PRO-STAT SUGAR FREE 64)  30 mL Oral TID  . ferric gluconate (FERRLECIT/NULECIT) IV  250 mg Intravenous Q T,Th,Sa-HD  . gabapentin  300 mg Oral QHS  . Gerhardt's butt cream   Topical BID  . glycopyrrolate  1 mg Per Tube Q breakfast  . guaiFENesin  600 mg Oral Q6H  . hydrocortisone   Rectal BID  . insulin aspart  0-5 Units Subcutaneous QHS  . insulin aspart  0-9 Units Subcutaneous TID WC  . insulin detemir  8 Units Subcutaneous BID  . lanthanum  1,000 mg Oral TID WC  . metoCLOPramide  5 mg Oral TID AC & HS  . midodrine  10 mg Oral q morning - 10a  . multivitamin  1 tablet Oral QHS  . pantoprazole  20 mg Oral BID  . sodium chloride flush  10-40 mL  Intracatheter Q12H  . Warfarin - Pharmacist Dosing Inpatient   Does not apply q1800   Background: 61 y/o AAF with DM2, HTN, HLD presented to [facility] ER with infposterior STEMI on 9/12->cath->. PCS with stent x 2 by Dr. Clayborn Bigness. EF 55-60% with severe MR.  Post-cath respiratory failure ->intubation. Shock and renal failure (creatinine 1.6 on adm). Trialysis catheter placed 9/14 and pt had CRRT at [facility] for 48 hours prior to transfer here on 08/18/16->back to cath lab -> ruptured posterior papillary muscle with severe MR->IABP/Swan. We were asked to see d/t progressive rise in creatinine/volume overload/anuric AKI .  CRRT re-initiated 08/21/16-9/20, then 9/21 post MVR. Transitioned to IHD.  Dialysis dependent since 08/17/16. No recovery. New ESRD.  Assessment/ Plan:    1. 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 Amio. Coumadin. INR supratherapeutic. Pharmacy managing. Using midodrine for BP.  2. 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  TTS schedule via Left TDC (9/28 Scot Dock). S/P LUA  AVG placed on 10/30 (brachioaxillary) by Dr. Donzetta Matters - will be ready by Nov 30. EDW looks to be about 57 kg. (outpt has spot [place] TTS)   3. Anemia: 2/2  ESRD/Fe def - on darbe 100 QTuesday. TSat 7%. Fe load with HD. Last Hb 10.5. 4. CKD-MBD: Phos high. Started lanthanum. Phos has been all over the place. Suspect not getting the binder with her food. Increased to 2 gm ac. PTH 199 no intervention needed yet. 2Ca bath with HD. Follow labs w/HD 5. Nutrition: Low albumin secondary to critical illness/prolonged hospitalization, (Nepro/continue renal vitamin)  Cough/intermittent dyspnea: Last CXR 11/5 clear, no edema. I presume aspiration has been the concern. On Dysphagia diet.  Has some inspiratory upper airway sounds.  ? Tracheal stenosis or tracheomalacia?.  ENT rec'd neurontin 300 mg daily, if pt shows signs of altered sensorium would decrease this to 100 mg daily--> gabapentin exclusively renally cleared. 6. Disposition - Cont rehab. Eventual home, HD at [place], [place] Cell [phone] Pager 10/18/2016, 4:10 PM

## 2016-10-18 NOTE — Patient Care Conference (Signed)
Inpatient RehabilitationTeam Conference and Plan of Care Update Date: 10/18/2016   Time: 2:15 PM    Patient Name: Deborah [name]      Medical Record Number: [id number]  Date of Birth: [birth year] Sex: Female         Room/Bed: 4M05C/4M05C-01 Payor Info: Payor: MEDICAID PENDING / Plan: MEDICAID PENDING / Product Type: *No Product type* /    Admitting Diagnosis: Debility  MVR   Admit Date/Time:  10/11/2016  4:28 PM Admission Comments: No comment available   Primary Diagnosis:  Debility Principal Problem: Debility  Patient Active Problem List   Diagnosis Date Noted  . Supratherapeutic INR   . Cough   . Oropharyngeal dysphagia   . Uncontrolled type 2 diabetes mellitus with complication, with long-term current use of insulin ([place])   . Physical debility 10/11/2016  . Metabolic encephalopathy 40/97/3532  . Debility   . ESRD on dialysis ([place])   . Type 2 diabetes mellitus with complication, with long-term current use of insulin ([place])   . Anemia of chronic disease   . Coronary artery disease involving native coronary artery of native heart without angina pectoris   . Acute on chronic respiratory failure with hypoxia ([place])   . Atrial flutter ([place])   . Persistent cough   . Dysphonia   . Sleep disturbance   . Diabetes mellitus type 2 in nonobese ([name])   . Stage 3 chronic kidney disease   . NSTEMI (non-ST elevated myocardial infarction) ([place])   . Tachypnea   . Hyponatremia   . Lymphocytosis   . Acute blood loss anemia   . Pressure injury of skin 09/18/2016  . Tracheostomy status ([place])   . Delirium   . Encounter for attention to tracheostomy ([place])   . History of MVR with cardiopulmonary bypass   . Systolic congestive heart failure ([place])   . HCAP (healthcare-associated pneumonia)   . PAF (paroxysmal atrial fibrillation) ([place])   . Acute systolic congestive heart failure ([place])   . AKI (acute kidney injury) ([place])   . Abnormal LFTs   . S/P MVR (mitral valve replacement)   . Mitral  regurgitation 08/23/2016  . Acute on chronic diastolic heart failure ([place]) 08/18/2016  . Acute pulmonary edema ([name])   . Respiratory failure ([place]) 08/17/2016  . Cardiogenic shock ([place]) 08/17/2016  . Acute hypoxemic respiratory failure ([place])   . STEMI (ST elevation myocardial infarction) ([place]) 08/15/2016  . Noncompliance 01/25/2015  . Bereavement 07/09/2014  . Low back pain 02/17/2014  . Cervical cancer screening 11/20/2013  . Routine general medical examination at a health care facility 10/14/2013  . Chronic kidney disease, stage 3 06/14/2012  . Hypertension 03/08/2012  . Diabetes mellitus type 2, controlled 03/08/2012  . Hyperlipidemia 03/08/2012    Expected Discharge Date: Expected Discharge Date: 11/01/16  Team Members Present: Physician leading conference: Dr. Delice Lesch Social Worker Present: Ovidio Kin, LCSW Nurse Present: Heather Roberts, RN PT Present: Carney Living, Dustin Folks, PT OT Present: Other (comment) Grayland Ormond Doe-OT) SLP Present: Weston Anna, SLP PPS Coordinator present : Daiva Nakayama, RN, CRRN     Current Status/Progress Goal Weekly Team Focus  Medical    Weakness, poor activity tolerance, dysphagia, dysarthria secondary to debility from multiple cardiac and pulmonary issues  Improve mobility, cough, supratherapeutic INR, dysphagia, DM, ESRD, anemia, leukocytosis  See above   Bowel/Bladder   Patient is anuric, incontinent of bowel, last BM on 10/17/16  timed toileting  monitor bowel function   Swallow/Nutrition/ Hydration   Dys. 2  textures with thin liquids, supervision for use of swallow strategies   Supervision  Tolerance of current diet, use of strategies, possible trials of upgraded textures    ADL's   Overall min/mod A  Overall supervision/Mod I  activity tolerance, standing endurance, ADL/self-care modifications, education, functional transfers   Mobility   min-mod A  supervision-mod I  activity tolerance, standing balance, generalized  strengthening, mobility training, energy conservation, pt/fam education   Communication             Safety/Cognition/ Behavioral Observations  Mod-Max A   Supervision  recall, problem solving, attention   Pain   No pain  to remain pain free  Assess and treat while in therapy   Skin   Chest incision is dry and intact  No new breakdown while in rehab  Continue monitoring the skin Q shift and PRN      *See Care Plan and progress notes for long and short-term goals.  Barriers to Discharge: Improve mobility, safety, cogh, INR, dysphagia, ESRD, DM, anemia, leukocytosis    Possible Resolutions to Barriers:  Therapies, phamracy recs for coumadin, Nephro recs, adjust DM meds, follow labs, ENT consult    Discharge Planning/Teaching Needs:  Home with daughter who can work from home. Multiple family members who will rotate care of pt aware will need 24 hr care.      Team Discussion:  Goals supervision-mod/i level. Currently having difficulty participating in three hours of therapies-made 15/7. Coughing more but non-productive. O2 sats good. Diet regular thin liquids. Coughing impacting eating due to stops when begins.Endurance and low activity tolerance working on.  Revisions to Treatment Plan:  DC 11/29-made 15/7   Continued Need for Acute Rehabilitation Level of Care: The patient requires daily medical management by a physician with specialized training in physical medicine and rehabilitation for the following conditions: Daily direction of a multidisciplinary physical rehabilitation program to ensure safe treatment while eliciting the highest outcome that is of practical value to the patient.: Yes Daily medical management of patient stability for increased activity during participation in an intensive rehabilitation regime.: Yes Daily analysis of laboratory values and/or radiology reports with any subsequent need for medication adjustment of medical intervention for : Cardiac  problems;Pulmonary problems;Other;Diabetes problems;Renal problems  Elease Hashimoto 10/18/2016, 3:17 PM

## 2016-10-18 NOTE — Progress Notes (Signed)
Speech Language Pathology Daily Session Note  Patient Details  Name: ZNIYAH MIDKIFF MRN: [id number] Date of Birth: [birth year]  Today's Date: 10/18/2016 SLP Individual Time: 0835-0900 SLP Individual Time Calculation (min): 25 min   Short Term Goals: Week 1: SLP Short Term Goal 1 (Week 1): Pt will selectively attend to tasks in a mildly distracting environment for 5-7 minutes with supervision verbal cues for redirection SLP Short Term Goal 2 (Week 1): Pt will complete basic familiar tasks with supervision verbal cues for functional problem solving.  SLP Short Term Goal 3 (Week 1): Pt will recall new information with supervision verbal cues for use of external aids.  SLP Short Term Goal 4 (Week 1): Pt will return demonstration of vocal hygiene techniques with min assist verbal cues.   SLP Short Term Goal 5 (Week 1): Pt will observe swallow precautions with PO intake at mod I level.  SLP Short Term Goal 6 (Week 1): Pt will tolerate trials of regular with oral management WFL at mod I.  Skilled Therapeutic Interventions:Skilled therapy intervention focused on dysphagia goals. Patient with continuing intermittent cough throughout session, regardless of PO intake. Patient consumed breakfast meal of Dys. 2 textures and thin liquids with no noted increase in coughing frequency; coughing appeared unrelated to PO intake and patient reports that she is tolerating the new Dys. 2 textures better than Dys. 3. Patient independently utilized chin tuck strategy after each sip of thin liquid, but required Mod A question cues for recall and implementation of alternating sips/bites. Patient with noted improvement in voicing, but after speaking with nephrologist amended patient's daily water intake from 24oz to 18oz to balance vocal hygiene needs with renal diet precautions. Patient left upright in bed with bed alarm on and all needs within reach. Continue current plan of care.    Function:  Eating Eating    Modified Consistency Diet: Yes Eating Assist Level: Supervision or verbal cues           Cognition Comprehension Comprehension assist level: Follows basic conversation/direction with extra time/assistive device  Expression   Expression assist level: Expresses basic 75 - 89% of the time/requires cueing 10 - 24% of the time. Needs helper to occlude trach/needs to repeat words.  Social Interaction Social Interaction assist level: Interacts appropriately 90% of the time - Needs monitoring or encouragement for participation or interaction.  Problem Solving Problem solving assist level: Solves basic 50 - 74% of the time/requires cueing 25 - 49% of the time  Memory Memory assist level: Recognizes or recalls 50 - 74% of the time/requires cueing 25 - 49% of the time    Pain Pain Assessment Pain Assessment: No/denies pain  Therapy/Group: Individual Therapy  Thornton Papas 10/18/2016, 3:18 PM

## 2016-10-18 NOTE — Progress Notes (Signed)
Occupational Therapy Weekly Progress Note  Patient Details  Name: Deborah [name] MRN: [id number] Date of Birth: 1956-10-23  Beginning of progress report period: October 12, 2016 End of progress report period: October 18, 2016  Today's Date: 10/18/2016 OT Individual Time: 1235-1400 OT Individual Time Calculation (min): 85 min    Patient has met 1 of 4 short term goals.  Pt is making slow progress with OT treatments at this time. Pt continues to be limited by fatigue and non-productive cough which is very limiting for pt's ability to participate in continuous activity-POC changed to 15/7. Pt has demonstrated improved ability to follow commands, and can complete stand-pivot transfers with overall mod A 2/2 posterior LOB. Pt continues to require min cues to initiate B/D tasks, and requires multiple rest breaks between sit<>stands during ADL.   Patient continues to demonstrate the following deficits: muscle weakness, decreased cardiorespiratoy endurance and decreased standing balance and decreased balance strategies  and therefore will continue to benefit from skilled OT intervention to enhance overall performance with BADL.  Patient progressing toward long term goals..  Plan of care revisions: 15/7.  OT Short Term Goals Week 1:  OT Short Term Goal 1 (Week 1): Pt will tolerate 5 minutes standing at the sink to complete ADL tasks  with close supervision.  OT Short Term Goal 1 - Progress (Week 1): Progressing toward goal OT Short Term Goal 2 (Week 1): Pt will complete LB dressing with Min A OT Short Term Goal 2 - Progress (Week 1): Progressing toward goal OT Short Term Goal 3 (Week 1): Pt will complete toilet transfer with overall Min A OT Short Term Goal 3 - Progress (Week 1): Progressing toward goal OT Short Term Goal 4 (Week 1): Pt will follow 75% commands with min questioning cues. OT Short Term Goal 4 - Progress (Week 1): Met Week 2:  OT Short Term Goal 1 (Week 2): Pt will tolerate 5  minutes standing at the sink to complete ADL tasks without seated rest break.  OT Short Term Goal 2 (Week 2): Pt will complete LB dressing with Min A OT Short Term Goal 3 (Week 2): Pt will maintain dynamic standing balance during ADL task with overall Min A OT Short Term Goal 4 (Week 2): Pt will complete toileting with Mod A OT Short Term Goal 5 (Week 2): Pt will complete toilet transfer with overall Min A and LRAD   Skilled Therapeutic Interventions/Progress Updates:   1:1 OT session focused on activity tolerance, standing endurance, modified bathing/dressing, and self-feeding. Pt greeted for first OT session in bed with episode of coughing and fatigue. Pt declined to participate in OT at the time, but agreeable to make up time before lunch. Pt with increased fatigue and shortness of breath during therapy session today. Stand-pivot to w/c with Mod A, and Min A sit<>stands for B/D. Pt incontinent of stool requiring total A for toileting today and Mod A for LB dressing. Overall Min/Mod A for standing balance w/ near posterior LOB when pulling pants over hips requiring Mod A to correct. Pt needed more rest breaks today during ADLs w/ UE tremors noted w/ fatigue.  Assisted pt with self-feeding upon lunch tray arrival. Pt required full supervision with VC for chin tuck, to swallow and to alternate food/drink between bites. Pt with increased coughing after meal; able to spit out minimal secretions. Rn notified of patient status and pt left seated up in w/c with needs met and call bell within reach.   Therapy  Documentation Precautions:  Precautions Precautions: Fall Restrictions Weight Bearing Restrictions: No General: General OT Amount of Missed Time: 35 Minutes Pain: Pain Assessment Pain Assessment: No/denies pain ADL: ADL ADL Comments: Please see functional navigator See Function Navigator for Current Functional Status.   Therapy/Group: Individual Therapy  Valma Cava 10/18/2016, 3:58  PM

## 2016-10-18 NOTE — Progress Notes (Signed)
ANTICOAGULATION CONSULT NOTE - Follow Up Consult  Pharmacy Consult for coumadin Indication: a flutter  No Known Allergies  Patient Measurements: Height: 5\' 1"  (154.9 cm) Weight: 123 lb 7.3 oz (56 kg) IBW/kg (Calculated) : 47.8 Heparin Dosing Weight:   Vital Signs: Temp: 98.9 F (37.2 C) (11/15 0530) Temp Source: Oral (11/15 0530) BP: 100/58 (11/15 0530) Pulse Rate: 90 (11/15 0530)  Labs:  Recent Labs  10/16/16 0607 10/17/16 0455 10/17/16 0603  HGB  --   --  10.5*  HCT  --   --  33.3*  PLT  --   --  498*  LABPROT 42.4* 48.6* 49.2*  INR 4.30* 5.06* 5.18*  CREATININE 4.01* 5.38* 5.79*    Estimated Creatinine Clearance: 7.9 mL/min (by C-G formula based on SCr of 5.79 mg/dL (H)).   Medications:  Scheduled:  . amiodarone  200 mg Oral BID  . atorvastatin  20 mg Oral q1800  . clopidogrel  75 mg Oral Daily  . darbepoetin (ARANESP) injection - DIALYSIS  100 mcg Intravenous Q Tue-HD  . famotidine  20 mg Oral Daily  . feeding supplement  1 Container Oral TID BM  . feeding supplement (PRO-STAT SUGAR FREE 64)  30 mL Oral TID  . ferric gluconate (FERRLECIT/NULECIT) IV  250 mg Intravenous Q T,Th,Sa-HD  . gabapentin  300 mg Oral QHS  . Gerhardt's butt cream   Topical BID  . glycopyrrolate  1 mg Per Tube Q breakfast  . guaiFENesin  600 mg Oral Q6H  . hydrocortisone   Rectal BID  . insulin aspart  0-5 Units Subcutaneous QHS  . insulin aspart  0-9 Units Subcutaneous TID WC  . insulin detemir  8 Units Subcutaneous BID  . lanthanum  1,000 mg Oral TID WC  . metoCLOPramide  5 mg Oral TID AC & HS  . midodrine  10 mg Oral q morning - 10a  . multivitamin  1 tablet Oral QHS  . pantoprazole  20 mg Oral BID  . sodium chloride flush  10-40 mL Intracatheter Q12H  . Warfarin - Pharmacist Dosing Inpatient   Does not apply q1800   Infusions:    Assessment: 60 yo female with aflutter is currently on supratherapeutic coumadin.  INR today is 5.02.  Goal of Therapy:  INR 2 -  2.5 Monitor platelets by anticoagulation protocol: Yes   Plan:  - no coumadin tonight - INR in am - consider vitamin K?  Janelle Spellman, Tsz-Yin 10/18/2016,8:15 AM

## 2016-10-18 NOTE — Progress Notes (Signed)
[facility] PHYSICAL MEDICINE & REHABILITATION     PROGRESS NOTE  Subjective/Complaints:  Pt sitting up in bed this AM.  She had issues to coughing and regurgitation yesterday and medications were adjusted.  See PA note. She states she feels better after the adjustment.   ROS: +Cough. Denies CP, SOB, N/V/D.  Objective: Vital Signs: Blood pressure (!) 100/58, pulse 90, temperature 98.9 F (37.2 C), temperature source Oral, resp. rate 18, height 5\' 1"  (1.549 m), weight 56 kg (123 lb 7.3 oz), SpO2 100 %. No results found.  Recent Labs  10/17/16 0603  WBC 15.2*  HGB 10.5*  HCT 33.3*  PLT 498*    Recent Labs  10/17/16 0455 10/17/16 0603  NA 132* 133*  K 4.3 4.2  CL 93* 95*  GLUCOSE 84 79  BUN 37* 38*  CREATININE 5.38* 5.79*  CALCIUM 8.5* 8.5*   CBG (last 3)   Recent Labs  10/17/16 1129 10/17/16 2033 10/18/16 0645  GLUCAP 123* 102* 123*    Wt Readings from Last 3 Encounters:  10/18/16 56 kg (123 lb 7.3 oz)  10/11/16 58.1 kg (128 lb 1.4 oz)  08/18/16 71.2 kg (156 lb 15.5 oz)    Physical Exam:  BP (!) 100/58 (BP Location: Right Arm)   Pulse 90   Temp 98.9 F (37.2 C) (Oral)   Resp 18   Ht 5\' 1"  (1.549 m)   Wt 56 kg (123 lb 7.3 oz)   SpO2 100%   BMI 23.33 kg/m  Constitutional:  She appears well-developed and well-nourished. NAD. HENT: Normocephalic and atraumatic.  Eyes: EOM are normal. No discharge.  Cardiovascular: RRR. No JVD. Respiratory: +cough. Effort normal otherwise. She has no wheezes. GI: Soft. Bowel sounds are normal. She exhibits no distension.  Musculoskeletal: She exhibits tenderness (LUE-AVG). She exhibits no edema.  Neurological: She is alert and oriented.  Dysphonia improving Motor: 4+/5 proximal to distal Skin: Skin is warm and dry. She is not diaphoretic. No erythema.  Incision healing Psychiatric: Her affect is blunt. She is slowed, improving.    Assessment/Plan: 1. Functional deficits secondary to debility from multiply cardial  and pulmonary issues which require 3+ hours per day of interdisciplinary therapy in a comprehensive inpatient rehab setting. Physiatrist is providing close team supervision and 24 hour management of active medical problems listed below. Physiatrist and rehab team continue to assess barriers to discharge/monitor patient progress toward functional and medical goals.  Function:  Bathing Bathing position   Position: Wheelchair/chair at sink  Bathing parts Body parts bathed by patient: Right arm, Left arm, Chest, Abdomen, Right upper leg, Left upper leg, Front perineal area, Left lower leg Body parts bathed by helper: Buttocks, Right lower leg  Bathing assist Assist Level: Touching or steadying assistance(Pt > 75%)      Upper Body Dressing/Undressing Upper body dressing   What is the patient wearing?: Pull over shirt/dress, Bra Bra - Perfomed by patient: Thread/unthread right bra strap, Thread/unthread left bra strap Bra - Perfomed by helper: Hook/unhook bra (pull down sports bra) Pull over shirt/dress - Perfomed by patient: Thread/unthread right sleeve, Put head through opening, Thread/unthread left sleeve Pull over shirt/dress - Perfomed by helper: Pull shirt over trunk        Upper body assist Assist Level: Touching or steadying assistance(Pt > 75%)      Lower Body Dressing/Undressing Lower body dressing   What is the patient wearing?: Pants, Non-skid slipper socks     Pants- Performed by patient: Pull pants up/down, Thread/unthread  right pants leg, Thread/unthread left pants leg Pants- Performed by helper: Thread/unthread left pants leg Non-skid slipper socks- Performed by patient: Don/doff right sock, Don/doff left sock Non-skid slipper socks- Performed by helper: Don/doff left sock                  Lower body assist Assist for lower body dressing: Touching or steadying assistance (Pt > 75%)      Toileting Toileting Toileting activity did not occur: No continent  bowel/bladder event   Toileting steps completed by helper: Performs perineal hygiene, Adjust clothing prior to toileting, Adjust clothing after toileting    Toileting assist Assist level: Touching or steadying assistance (Pt.75%)   Transfers Chair/bed transfer Chair/bed transfer activity did not occur: Safety/medical concerns Chair/bed transfer method: Ambulatory Chair/bed transfer assist level: Touching or steadying assistance (Pt > 75%) Chair/bed transfer assistive device: Armrests, Medical sales representative     Max distance: 60 ft Assist level: Touching or steadying assistance (Pt > 75%)   Wheelchair   Type: Manual Max wheelchair distance: 100 ft Assist Level: Supervision or verbal cues  Cognition Comprehension Comprehension assist level: Understands basic 90% of the time/cues < 10% of the time  Expression Expression assist level: Expresses basic 75 - 89% of the time/requires cueing 10 - 24% of the time. Needs helper to occlude trach/needs to repeat words.  Social Interaction Social Interaction assist level: Interacts appropriately 90% of the time - Needs monitoring or encouragement for participation or interaction.  Problem Solving Problem solving assist level: Solves basic 50 - 74% of the time/requires cueing 25 - 49% of the time  Memory Memory assist level: Recognizes or recalls 50 - 74% of the time/requires cueing 25 - 49% of the time    Medical Problem List and Plan: 1.  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  Cont CIR 2.  DVT Prophylaxis/Anticoagulation: Pharmaceutical: Coumadin  INR pending 11/15, appreciate Pharmacy recs 3. Pain Management: Hydrocodone prn effective. 4. Mood: LCSW to follow for evaluation and support.  5. Neuropsych: This patient is not fully capable of making decisions on her own behalf. 6. Skin/Wound Care: Routine pressure relief measures.  7.  Fluids/Electrolytes/Nutrition: Strict I/O. Renal panel TTSa with HD.   D3, thin diet, changed to D2 to help with fatigue  Weaned reglan 11/13, again on 11/14, increased back to 5mg  TID on 11/14 after regurgitation, will wean as tolerated 8. Leucocytosis:   Cont cipro for bronchitis   WBCs 15.2 on 11/14 (stable), however will consider CXR if necessary due to potential for aspiration  Labs with HD 10 T2DM:Hgb A1c- 13.3.   Levemir increased to 7U bid on 11/10, increased to [place] on 11/13, will decrease to 8U on 11/14  Improving  Will monitor blood sugars ac/hs with SSI for elevated BS   CBG (last 3)   Recent Labs  10/17/16 1129 10/17/16 2033 10/18/16 0645  GLUCAP 123* 102* 123*    11. ESRD: Now HD dependent--to schedule HD in afternoons to help with tolerance of therapy during the day. Educate patient and family on renal diet. Electrolyte abnormalities being managed during HD.  12. Anemia of chronic disease:   Hb 10.5 on 11/14   Monitor for signs of bleeding.   On aranesp for erythropoiesis and nulecit for iron load  Labs with HD 13. CAD s/p DES X 2 and MVR: Monitor for symptoms with increase in activity. On plavix and Lipitor.  14.  Acute respiratory failure due to acute pulmonary edema:  Fluid overload compensated with HD.  15. AFlutter s/p DCCV/PAF: HR controlled on amiodarone. On coumadin.  16. Persistent cough with dysphonia: Off antibiotics. Repeat CXR 11/12 unremarkable for acute process  Cont neb  Continue robinul for secretions and Robitussin DM for cough, increased on 11/14  Encourage IS with use of flutter valve  ENT consult pending 17. Sleep disturbance: managed with benadryl prn  LOS (Days) 7 A FACE TO FACE EVALUATION WAS PERFORMED  Dondrea Clendenin Lorie Phenix 10/18/2016 8:26 AM

## 2016-10-18 NOTE — Plan of Care (Signed)
Plan of care changed to 15/7  Difficulty tolerating rigorous therapy schedule 2/2 low activity tolerance and non-productive cough which is very fatiguing for pt. Care team agreeable and POC updated.   Daneen Schick Breckin Sav[place] OTR/L

## 2016-10-18 NOTE — Progress Notes (Signed)
      [address]       ,[place] [address]             [phone]         Subjective: Overall doing better.   Objective: Vital signs in last 24 hours: Temp:  [98.2 F (36.8 C)-98.9 F (37.2 C)] 98.9 F (37.2 C) (11/15 0530) Pulse Rate:  [90-112] 90 (11/15 0530) Resp:  [18-26] 18 (11/15 0530) BP: (93-138)/(50-68) 100/58 (11/15 0530) SpO2:  [92 %-100 %] 100 % (11/15 0530) Weight:  [123 lb 7.3 oz (56 kg)-125 lb 7.1 oz (56.9 kg)] 123 lb 7.3 oz (56 kg) (11/15 0530)     Intake/Output from previous day: 11/14 0701 - 11/15 0700 In: 240 [P.O.:240] Out: 1000  Intake/Output this shift: Total I/O In: 120 [P.O.:120] Out: -   General appearance: cooperative and no distress Heart: regular rate and rhythm Lungs: diffuse rhonchi Extremities: no edema Wound: c/d/i without drainage  Lab Results:  Recent Labs  10/17/16 0603  WBC 15.2*  HGB 10.5*  HCT 33.3*  PLT 498*   BMET:  Recent Labs  10/17/16 0603 10/18/16 0630  NA 133* 131*  K 4.2 4.7  CL 95* 91*  CO2 25 26  GLUCOSE 79 116*  BUN 38* 12  CREATININE 5.79* 3.16*  CALCIUM 8.5* 8.7*    PT/INR:  Recent Labs  10/18/16 0630  LABPROT 46.7*  INR 5.02*   ABG    Component Value Date/Time   PHART 7.487 (H) 10/04/2016 1430   HCO3 28.4 (H) 10/04/2016 1430   TCO2 26 09/08/2016 0412   ACIDBASEDEF 4.1 (H) 09/02/2016 1850   O2SAT 95.5 10/04/2016 1430   CBG (last 3)   Recent Labs  10/17/16 1129 10/17/16 2033 10/18/16 0645  GLUCAP 123* 102* 123*    Assessment/Plan:  1. Events noted-patient had a coughing exacerbation with associated shortness of breath. Medications adjusted.  2. Coumadin held again for supratherapeutic INR 3. Tolerating a normal diet, SLP following 4. Continues to make good progress.    LOS: 7 days    Elgie Collard 10/18/2016

## 2016-10-19 ENCOUNTER — Inpatient Hospital Stay (HOSPITAL_COMMUNITY): Payer: Medicaid Other | Admitting: Physical Therapy

## 2016-10-19 ENCOUNTER — Inpatient Hospital Stay (HOSPITAL_COMMUNITY): Payer: Self-pay | Admitting: Speech Pathology

## 2016-10-19 ENCOUNTER — Inpatient Hospital Stay (HOSPITAL_COMMUNITY): Payer: Self-pay | Admitting: Occupational Therapy

## 2016-10-19 LAB — RENAL FUNCTION PANEL
ALBUMIN: 2.7 g/dL — AB (ref 3.5–5.0)
Anion gap: 14 (ref 5–15)
BUN: 39 mg/dL — AB (ref 6–20)
CO2: 24 mmol/L (ref 22–32)
CREATININE: 5.69 mg/dL — AB (ref 0.44–1.00)
Calcium: 8.7 mg/dL — ABNORMAL LOW (ref 8.9–10.3)
Chloride: 90 mmol/L — ABNORMAL LOW (ref 101–111)
GFR calc Af Amer: 9 mL/min — ABNORMAL LOW (ref >60)
GFR, EST NON AFRICAN AMERICAN: 7 mL/min — AB (ref >60)
Glucose, Bld: 79 mg/dL (ref 65–99)
PHOSPHORUS: 4.8 mg/dL — AB (ref 2.5–4.6)
POTASSIUM: 4.7 mmol/L (ref 3.5–5.1)
Sodium: 128 mmol/L — ABNORMAL LOW (ref 135–145)

## 2016-10-19 LAB — GLUCOSE, CAPILLARY
GLUCOSE-CAPILLARY: 80 mg/dL (ref 65–99)
Glucose-Capillary: 119 mg/dL — ABNORMAL HIGH (ref 65–99)
Glucose-Capillary: 138 mg/dL — ABNORMAL HIGH (ref 65–99)
Glucose-Capillary: 61 mg/dL — ABNORMAL LOW (ref 65–99)

## 2016-10-19 LAB — CBC
HEMATOCRIT: 31.9 % — AB (ref 36.0–46.0)
Hemoglobin: 10.2 g/dL — ABNORMAL LOW (ref 12.0–15.0)
MCH: 27.4 pg (ref 26.0–34.0)
MCHC: 32 g/dL (ref 30.0–36.0)
MCV: 85.8 fL (ref 78.0–100.0)
PLATELETS: 460 10*3/uL — AB (ref 150–400)
RBC: 3.72 MIL/uL — ABNORMAL LOW (ref 3.87–5.11)
RDW: 15.8 % — AB (ref 11.5–15.5)
WBC: 13.6 10*3/uL — AB (ref 4.0–10.5)

## 2016-10-19 LAB — PROTIME-INR
INR: 3.77
Prothrombin Time: 38.1 seconds — ABNORMAL HIGH (ref 11.4–15.2)

## 2016-10-19 NOTE — Progress Notes (Signed)
ANTICOAGULATION CONSULT NOTE - Follow Up Consult  Pharmacy Consult for Coumadin Indication: atrial fibrillation  No Known Allergies  Patient Measurements: Height: 5\' 1"  (154.9 cm) Weight: 125 lb 7.1 oz (56.9 kg) IBW/kg (Calculated) : 47.8  Vital Signs: Temp: 99.1 F (37.3 C) (11/16 0500) Temp Source: Oral (11/16 0500) BP: 112/61 (11/16 0500) Pulse Rate: 93 (11/16 0500)  Labs:  Recent Labs  10/17/16 0455 10/17/16 0603 10/18/16 0630 10/19/16 0518  HGB  --  10.5*  --   --   HCT  --  33.3*  --   --   PLT  --  498*  --   --   LABPROT 48.6* 49.2* 46.7* 38.1*  INR 5.06* 5.18* 5.02* 3.77  CREATININE 5.38* 5.79* 3.16*  --     Estimated Creatinine Clearance: 14.5 mL/min (by C-G formula based on SCr of 3.16 mg/dL (H)).   Medications:  Scheduled:  . amiodarone  200 mg Oral BID  . atorvastatin  20 mg Oral q1800  . clopidogrel  75 mg Oral Daily  . darbepoetin (ARANESP) injection - DIALYSIS  100 mcg Intravenous Q Tue-HD  . famotidine  20 mg Oral Daily  . feeding supplement  1 Container Oral TID BM  . feeding supplement (PRO-STAT SUGAR FREE 64)  30 mL Oral TID  . ferric gluconate (FERRLECIT/NULECIT) IV  250 mg Intravenous Q T,Th,Sa-HD  . gabapentin  300 mg Oral QHS  . Gerhardt's butt cream   Topical BID  . glycopyrrolate  1 mg Per Tube Q breakfast  . guaiFENesin  600 mg Oral Q6H  . hydrocortisone   Rectal BID  . insulin aspart  0-5 Units Subcutaneous QHS  . insulin aspart  0-9 Units Subcutaneous TID WC  . insulin detemir  8 Units Subcutaneous BID  . lanthanum  1,000 mg Oral TID WC  . metoCLOPramide  5 mg Oral TID AC & HS  . midodrine  10 mg Oral q morning - 10a  . multivitamin  1 tablet Oral QHS  . pantoprazole  20 mg Oral BID  . sodium chloride flush  10-40 mL Intracatheter Q12H  . Warfarin - Pharmacist Dosing Inpatient   Does not apply q1800    Assessment: 59yo female with AFlutter, on supratherapeutic Coumadin.  INR improved today.  No Vit K has been given, no  bleeding noted.  Pt is on Amiodarone which may increase Coumadin effect, and completed Cipro on 11/5.  Goal of Therapy:  INR 2-2.5 per Dr Darcey Nora Monitor platelets by anticoagulation protocol: Yes   Plan:  No Coumadin today Continue daily INR   Gracy Bruins, PharmD [facility]

## 2016-10-19 NOTE — Progress Notes (Signed)
Patient ID: Deborah [name], female   DOB: [birth year], 60 y.o.   MRN: [id number]  [facility] Progress Note   Subjective:    Seen in this PM- sleeping    Objective:   BP 104/64 (BP Location: Right Arm)   Pulse 93   Temp 98.9 F (37.2 C) (Oral)   Resp 17   Ht 5\' 1"  (1.549 m)   Wt 56.9 kg (125 lb 7.1 oz)   SpO2 99%   BMI 23.70 kg/m   Physical Exam: VS as noted NAD, ssleeping Prev trach site closed, + dry cough with inspiratory upper airway sounds Chest is clear anteriorly, muffled at bases S1S2 No S3 Healed sternotomy scar Abd soft and not tender No edema of LE's Left upper arm AVG + bruit (10/30) Left sided TDC (9/28 VVS)   Recent Labs Lab 10/13/16 0538 10/14/16 0411 10/15/16 0319 10/15/16 0735 10/16/16 0607 10/17/16 0455 10/17/16 0603 10/18/16 0630  NA 132* 132* 135 134* 131* 132* 133* 131*  K 3.9 4.0 3.6 3.8 4.3 4.3 4.2 4.7  CL 95* 94* 95* 95* 94* 93* 95* 91*  CO2 24 24 25 23 23 24 25 26   GLUCOSE 143* 212* 93 134* 167* 84 79 116*  BUN 12 25* 34* 5* 21* 37* 38* 12  CREATININE 2.74* 4.41* 5.76* 2.05* 4.01* 5.38* 5.79* 3.16*  CALCIUM 8.1* 8.5* 8.5* 8.5* 8.6* 8.5* 8.5* 8.7*  PHOS 5.9* 8.7* 9.2* 3.1 5.4* 6.2*  --  5.0*   Iron/TIBC/Ferritin/ %Sat    Component Value Date/Time   IRON 22 (L) 10/10/2016 0356   TIBC 326 10/10/2016 0356   FERRITIN 272 10/10/2016 0356   IRONPCTSAT 7 (L) 10/10/2016 0356   Results for Deborah [name] (MRN [id number]) as of 10/12/2016 08:46  Ref. Range 10/10/2016 03:56  PTH Latest Ref Range: 15 - 65 pg/mL 199 (H)    Recent Labs Lab 10/17/16 0603  WBC 15.2*  NEUTROABS 12.1*  HGB 10.5*  HCT 33.3*  MCV 86.3  PLT 498*    Lab Results  Component Value Date   INR 3.77 10/19/2016   INR 5.02 (HH) 10/18/2016   INR 5.18 (HH) 10/17/2016    Medications:    . amiodarone  200 mg Oral BID  . atorvastatin  20 mg Oral q1800  . clopidogrel  75 mg Oral Daily  . darbepoetin (ARANESP) injection - DIALYSIS  100 mcg  Intravenous Q Tue-HD  . famotidine  20 mg Oral Daily  . feeding supplement  1 Container Oral TID BM  . feeding supplement (PRO-STAT SUGAR FREE 64)  30 mL Oral TID  . ferric gluconate (FERRLECIT/NULECIT) IV  250 mg Intravenous Q T,Th,Sa-HD  . gabapentin  300 mg Oral QHS  . Gerhardt's butt cream   Topical BID  . glycopyrrolate  1 mg Per Tube Q breakfast  . guaiFENesin  600 mg Oral Q6H  . hydrocortisone   Rectal BID  . insulin aspart  0-5 Units Subcutaneous QHS  . insulin aspart  0-9 Units Subcutaneous TID WC  . insulin detemir  8 Units Subcutaneous BID  . lanthanum  1,000 mg Oral TID WC  . metoCLOPramide  5 mg Oral TID AC & HS  . midodrine  10 mg Oral q morning - 10a  . multivitamin  1 tablet Oral QHS  . pantoprazole  20 mg Oral BID  . sodium chloride flush  10-40 mL Intracatheter Q12H  . Warfarin - Pharmacist Dosing Inpatient   Does not apply [phone]  Background: 60 y/o AAF with DM2, HTN, HLD presented to [facility] ER with infposterior STEMI on 9/12->cath->. PCS with stent x 2 by Dr. Clayborn Bigness. EF 55-60% with severe MR.  Post-cath respiratory failure ->intubation. Shock and renal failure (creatinine 1.6 on adm). Trialysis catheter placed 9/14 and pt had CRRT at [facility] for 48 hours prior to transfer here on 08/18/16->back to cath lab -> ruptured posterior papillary muscle with severe MR->IABP/[place]. We were asked to see d/t progressive rise in creatinine/volume overload/anuric AKI .  CRRT re-initiated 08/21/16-9/20, then 9/21 post MVR. Transitioned to IHD.  Dialysis dependent since 08/17/16. No recovery. New ESRD.  Assessment/ Plan:    1. 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 Amio. Coumadin. INR supratherapeutic. Pharmacy managing. Using midodrine for BP.  2. 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  TTS schedule via Left TDC (9/28 [facility]). S/P LUA AVG placed on 10/30 (brachioaxillary) by Dr. Donzetta Matters - will be ready by Nov 30. EDW looks to be  about 57 kg. (outpt has spot [facility] TTS)  Next HD today 11/16. 3. Anemia: 2/2  ESRD/Fe def - on darbe 100 QTuesday. TSat 7%. Fe load with HD. Last Hb 10.5. 4. CKD-MBD: Phos high. Started lanthanum. Phos has been all over the place. Suspect not getting the binder with her food. Increased to 2 gm ac. PTH 199 no intervention needed yet. 2Ca bath with HD. Follow labs w/HD 5. Nutrition: Low albumin secondary to critical illness/prolonged hospitalization, (Nepro/continue renal vitamin)  Cough/intermittent dyspnea: Last CXR 11/5 clear, no edema. I presume aspiration has been the concern. On Dysphagia diet.  Has some inspiratory upper airway sounds.  ? Tracheal stenosis or tracheomalacia?.  ENT rec'd neurontin 300 mg daily, if pt shows signs of altered sensorium would decrease this to 100 mg daily--> gabapentin exclusively renally cleared. 6. Disposition - Cont rehab. Eventual home, HD at [place], [facility] [phone] Pager 10/19/2016, 3:03 PM

## 2016-10-19 NOTE — Progress Notes (Addendum)
Social Work Patient ID: Deborah [name], female   DOB: Apr 30, 1956, 60 y.o.   MRN: [id number]  Met with pt to discuss team conference goal supervision level and target discharge 11/29. Teresa-daughter also made aware, she has numerous questions regarding medicaid, care at discharge and paying for medications. Will meet with daughter tomorrow at 3:00 pm  To answer her questions and address her concerns. Also contacted Renal HD and spoke with Bethena Roys to inform of the target discharge date so OP HD could be set up in [place].

## 2016-10-19 NOTE — Progress Notes (Signed)
Physical Therapy Session Note  Patient Details  Name: Deborah [name] MRN: [id number] Date of Birth: 09-Apr-1956  Today's Date: 10/19/2016 PT Individual Time: 1335-1413 PT Individual Time Calculation (min): 38 min   Short Term Goals: Week 1:  PT Short Term Goal 1 (Week 1): Patient will perform transfers with min A.  PT Short Term Goal 1 - Progress (Week 1): Partly met (inconsistent) PT Short Term Goal 2 (Week 1): Patient will ambulate 75 ft with min A.  PT Short Term Goal 2 - Progress (Week 1): Partly met (inconsistent) PT Short Term Goal 3 (Week 1): Patient will maintain dynamic standing balance x 1 min with min A.  PT Short Term Goal 3 - Progress (Week 1): Partly met PT Short Term Goal 4 (Week 1): Patient will negotiate up/down 4 stairs using L rail only with min A.  PT Short Term Goal 4 - Progress (Week 1): Not met PT Short Term Goal 5 (Week 1): Patient will propel wheelchair x 150 ft with supervision for strengthening.  PT Short Term Goal 5 - Progress (Week 1): Met  Skilled Therapeutic Interventions/Progress Updates:    Patient asleep in bed upon arrival, awakened and required increased time to come to EOB with supervision. Session focused on sit <> stand and stand pivot transfers using RW with max cues for hand placement and technique and keeping RW closer to body and min A overall, gait training using RW 2 x 50 ft with cues for R heel strike due to decreased RLE clearance and upright posture with min A, stair training up/down 4 (6") stairs using 2 rails with cues for safe step-to pattern ascending leading with LLE and descending with RLE with mod A, and propelling wheelchair using BUE back to room with more than reasonable time and supervision. Patient with less frequent coughing this date but appeared fatigued and required prolonged seated rest breaks with all mobility tasks to recover due to shortness of breath and fatigue. Patient left semi reclined in bed with all needs in reach and  bed alarm on.   Therapy Documentation Precautions:  Precautions Precautions: Fall Restrictions Weight Bearing Restrictions: No Pain: Pain Assessment Pain Assessment: No/denies pain   See Function Navigator for Current Functional Status.   Therapy/Group: Individual Therapy  Laretta Alstrom 10/19/2016, 2:15 PM

## 2016-10-19 NOTE — Progress Notes (Signed)
Speech Language Pathology Weekly Progress and Session Note  Patient Details  Name: Deborah [name] MRN: [id number] Date of Birth: [birth year]  Beginning of progress report period: October 12, 2016 End of progress report period: October 19, 2016  Today's Date: 10/19/2016 SLP Individual Time: 8250-0370 SLP Individual Time Calculation (min): 60 min   Short Term Goals: Week 1: SLP Short Term Goal 1 (Week 1): Pt will selectively attend to tasks in a mildly distracting environment for 5-7 minutes with supervision verbal cues for redirection SLP Short Term Goal 1 - Progress (Week 1): Met SLP Short Term Goal 2 (Week 1): Pt will complete basic familiar tasks with supervision verbal cues for functional problem solving.  SLP Short Term Goal 2 - Progress (Week 1): Met SLP Short Term Goal 3 (Week 1): Pt will recall new information with supervision verbal cues for use of external aids.  SLP Short Term Goal 3 - Progress (Week 1): Partly met SLP Short Term Goal 4 (Week 1): Pt will return demonstration of vocal hygiene techniques with min assist verbal cues.   SLP Short Term Goal 4 - Progress (Week 1): Partly met SLP Short Term Goal 5 (Week 1): Pt will observe swallow precautions with PO intake at mod I level.  SLP Short Term Goal 5 - Progress (Week 1): Partly met SLP Short Term Goal 6 (Week 1): Pt will tolerate trials of regular with oral management WFL at mod I. SLP Short Term Goal 6 - Progress (Week 1): Revised due to lack of progress    New Short Term Goals: Week 2: SLP Short Term Goal 1 (Week 2): Pt will recall new information with supervision verbal cues for use of external aids.  SLP Short Term Goal 2 (Week 2): Pt will observe swallow precautions with PO intake at mod I level.  SLP Short Term Goal 3 (Week 2): Pt will tolerate trials of Dys 3 and regular with oral management WFL at min A.  SLP Short Term Goal 4 (Week 2): Pt will demonstrate functional problem solving with min A. SLP Short Term  Goal 5 (Week 2): Pt will return demonstration of vocal hygiene techniques with min assist verbal cues.    Weekly Progress Updates:    Intensity: Minumum of 1-2 x/day, 30 to 90 minutes Frequency: 3 to 5 out of 7 days Duration/Length of Stay:   Treatment/Interventions: Dysphagia/aspiration precaution training;Environmental controls;Patient/family education;Cueing hierarchy;Cognitive remediation/compensation;Internal/external aids;Speech/Language facilitation;Functional tasks;Multimodal communication approach   Daily Session  Skilled Therapeutic Interventions: Pt declined PO intake this date as she was not hungry. Focus on cognitive goals. Pt able to verbally provide all compensatory strategies for swallowing at mod I with the exception of alternating solids with liquids. Recall of medications required mod A. Pt able to complete functional math word problems with min A. Therapy was somewhat interrupted by frequent coughing episodes with occasional production. Recall of novel information required min- mod semantic cueing for retrieval of information.       Function:   Eating Eating   Modified Consistency Diet: Yes Eating Assist Level: Supervision or verbal cues           Cognition Comprehension Comprehension assist level: Follows basic conversation/direction with extra time/assistive device  Expression   Expression assist level: Expresses basic 90% of the time/requires cueing < 10% of the time.  Social Interaction Social Interaction assist level: Interacts appropriately 90% of the time - Needs monitoring or encouragement for participation or interaction.  Problem Solving Problem solving assist level: Solves basic 50 -  74% of the time/requires cueing 25 - 49% of the time  Memory Memory assist level: Recognizes or recalls 50 - 74% of the time/requires cueing 25 - 49% of the time   General    Pain Pain Assessment Pain Assessment: No/denies pain  Therapy/Group: Individual  Therapy  Vinetta Bergamo MA, CCC-SLP 10/19/2016, 10:01 AM

## 2016-10-19 NOTE — Progress Notes (Addendum)
      [address]       [place] [address]             [phone]         Subjective: Tired this afternoon. Shares she had a busy morning with rehab.   Objective: Vital signs in last 24 hours: Temp:  [98.8 F (37.1 C)-99.1 F (37.3 C)] 99.1 F (37.3 C) (11/16 0500) Pulse Rate:  [84-93] 93 (11/16 0500) Resp:  [18] 18 (11/16 0500) BP: (112)/(61-62) 112/61 (11/16 0500) SpO2:  [95 %-98 %] 95 % (11/16 0500) Weight:  [125 lb 7.1 oz (56.9 kg)] 125 lb 7.1 oz (56.9 kg) (11/16 0500)     Intake/Output from previous day: 11/15 0701 - 11/16 0700 In: 420 [P.O.:420] Out: 300 [Urine:300] Intake/Output this shift: Total I/O In: 30 [P.O.:30] Out: -   General appearance: cooperative and no distress Heart: regular rate and rhythm Lungs: clear to auscultation bilaterally and rhonchi in the let upper lobe Abdomen: soft, non-tender; bowel sounds normal; no masses,  no organomegaly Extremities: extremities normal, atraumatic, no cyanosis or edema Wound: clean and dry  Lab Results:  Recent Labs  10/17/16 0603  WBC 15.2*  HGB 10.5*  HCT 33.3*  PLT 498*   BMET:  Recent Labs  10/17/16 0603 10/18/16 0630  NA 133* 131*  K 4.2 4.7  CL 95* 91*  CO2 25 26  GLUCOSE 79 116*  BUN 38* 12  CREATININE 5.79* 3.16*  CALCIUM 8.5* 8.7*    PT/INR:  Recent Labs  10/19/16 0518  LABPROT 38.1*  INR 3.77   ABG    Component Value Date/Time   PHART 7.487 (H) 10/04/2016 1430   HCO3 28.4 (H) 10/04/2016 1430   TCO2 26 09/08/2016 0412   ACIDBASEDEF 4.1 (H) 09/02/2016 1850   O2SAT 95.5 10/04/2016 1430   CBG (last 3)   Recent Labs  10/18/16 2015 10/19/16 0621 10/19/16 1202  GLUCAP 173* 119* 138*    Assessment/Plan:  1. Cough-on multiple PRN agents. Medications recently adjusted by primary  2. Coumadin held again for supratherapeutic INR, coming down now 3.77 3. Tolerating a normal diet, SLP following, blood glucose level well controlled 4. Continues to make  good progress    LOS: 8 days    Elgie Collard 10/19/2016  Goal INR 2.0 - 2.5 for bioprosthetic MVR , nsr Patient is benefiting greatly from [place] team care patient examined and medical record reviewed,agree with above note. Tharon Aquas Trigt III 10/19/2016

## 2016-10-19 NOTE — Progress Notes (Signed)
[facility]IT[facility]     PROGRESS NOTE  Subjective/Complaints:  No new issues overnight still has a cough. No sputum production. No fevers or chills, no shortness of breath  ROS: +Cough. Denies CP, SOB, N/V/D.  Objective: Vital Signs: Blood pressure 104/64, pulse 93, temperature 98.9 F (37.2 C), temperature source Oral, resp. rate 17, height 5\' 1"  (1.549 m), weight 56.9 kg (125 lb 7.1 oz), SpO2 99 %. No results found.  Recent Labs  10/17/16 0603  WBC 15.2*  HGB 10.5*  HCT 33.3*  PLT 498*    Recent Labs  10/17/16 0603 10/18/16 0630  NA 133* 131*  K 4.2 4.7  CL 95* 91*  GLUCOSE 79 116*  BUN 38* 12  CREATININE 5.79* 3.16*  CALCIUM 8.5* 8.7*   CBG (last 3)   Recent Labs  10/18/16 2015 10/19/16 0621 10/19/16 1202  GLUCAP 173* 119* 138*    Wt Readings from Last 3 Encounters:  10/19/16 56.9 kg (125 lb 7.1 oz)  10/11/16 58.1 kg (128 lb 1.4 oz)  08/18/16 71.2 kg (156 lb 15.5 oz)    Physical Exam:  BP 104/64 (BP Location: Right Arm)   Pulse 93   Temp 98.9 F (37.2 C) (Oral)   Resp 17   Ht 5\' 1"  (1.549 m)   Wt 56.9 kg (125 lb 7.1 oz)   SpO2 99%   BMI 23.70 kg/m  Constitutional:  She appears well-developed and well-nourished. NAD. HENT: Normocephalic and atraumatic.  Eyes: EOM are normal. No discharge.  Cardiovascular: RRR. No JVD. Respiratory: +cough. Effort normal otherwise. She has no wheezes. GI: Soft. Bowel sounds are normal. She exhibits no distension.  Musculoskeletal: She exhibits tenderness (LUE-AVG). She exhibits no edema.  Neurological: She is alert and oriented.  Dysphonia improving Motor: 4+/5 proximal to distal Skin: Skin is warm and dry. She is not diaphoretic. No erythema.  Incision healing Psychiatric: Her affect is blunt. She is slowed, improving.    Assessment/Plan: 1. Functional deficits secondary to debility from multiply cardial and pulmonary issues which require 3+ hours per day of interdisciplinary  therapy in a comprehensive inpatient rehab setting. Physiatrist is providing close team supervision and 24 hour management of active medical problems listed below. Physiatrist and rehab team continue to assess barriers to discharge/monitor patient progress toward functional and medical goals.  Function:  Bathing Bathing position   Position: Wheelchair/chair at sink  Bathing parts Body parts bathed by patient: Right arm, Left arm, Chest, Abdomen, Right upper leg, Left upper leg, Front perineal area Body parts bathed by helper: Buttocks, Right lower leg, Left lower leg  Bathing assist Assist Level: Touching or steadying assistance(Pt > 75%)      Upper Body Dressing/Undressing Upper body dressing   What is the patient wearing?: Pull over shirt/dress Bra - Perfomed by patient: Thread/unthread right bra strap, Thread/unthread left bra strap Bra - Perfomed by helper: Hook/unhook bra (pull down sports bra) Pull over shirt/dress - Perfomed by patient: Thread/unthread left sleeve, Thread/unthread right sleeve, Put head through opening Pull over shirt/dress - Perfomed by helper: Pull shirt over trunk        Upper body assist Assist Level: Touching or steadying assistance(Pt > 75%)      Lower Body Dressing/Undressing Lower body dressing   What is the patient wearing?: Pants, Non-skid slipper socks     Pants- Performed by patient: Pull pants up/down, Thread/unthread right pants leg, Thread/unthread left pants leg Pants- Performed by helper: Thread/unthread left pants leg Non-skid slipper  socks- Performed by patient: Don/doff right sock, Don/doff left sock Non-skid slipper socks- Performed by helper: Don/doff left sock                  Lower body assist Assist for lower body dressing: Touching or steadying assistance (Pt > 75%)      Toileting Toileting Toileting activity did not occur: No continent bowel/bladder event   Toileting steps completed by helper: Performs perineal  hygiene, Adjust clothing prior to toileting, Adjust clothing after toileting    Toileting assist Assist level: Touching or steadying assistance (Pt.75%)   Transfers Chair/bed transfer Chair/bed transfer activity did not occur: Safety/medical concerns Chair/bed transfer method: Stand pivot Chair/bed transfer assist level: Touching or steadying assistance (Pt > 75%) Chair/bed transfer assistive device: Armrests, Medical sales representative     Max distance: 50 ft Assist level: Touching or steadying assistance (Pt > 75%)   Wheelchair   Type: Manual Max wheelchair distance: 150 ft Assist Level: Supervision or verbal cues  Cognition Comprehension Comprehension assist level: Follows basic conversation/direction with extra time/assistive device  Expression Expression assist level: Expresses basic 90% of the time/requires cueing < 10% of the time.  Social Interaction Social Interaction assist level: Interacts appropriately 90% of the time - Needs monitoring or encouragement for participation or interaction.  Problem Solving Problem solving assist level: Solves basic 50 - 74% of the time/requires cueing 25 - 49% of the time  Memory Memory assist level: Recognizes or recalls 50 - 74% of the time/requires cueing 25 - 49% of the time    Medical Problem List and Plan: 1.  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  Cont CIR 2.  DVT Prophylaxis/Anticoagulation: Pharmaceutical: Coumadin  INR pending 11/15, appreciate Pharmacy recs 3. Pain Management: Hydrocodone prn effective. 4. Mood: LCSW to follow for evaluation and support.  5. Neuropsych: This patient is not fully capable of making decisions on her own behalf. 6. Skin/Wound Care: Routine pressure relief measures.  7. Fluids/Electrolytes/Nutrition: Strict I/O. Renal panel TTSa with HD.   D3, thin diet, changed to D2 to help with fatigue  Weaned reglan  11/13, again on 11/14, increased back to 5mg  TID on 11/14 after regurgitation, will wean as tolerated 8. Leucocytosis:   Cont cipro for bronchitis   WBCs 15.2 on 11/14 (stable), appreciate CVT S follow-up Labs with HD 10 T2DM:Hgb A1c- 13.3.   Levemir increased to 7U bid on 11/10, increased to [place] on 11/13, will decrease to 8U on 11/14  Improving  Will monitor blood sugars ac/hs with SSI for elevated BS   CBG (last 3)   Recent Labs  10/18/16 2015 10/19/16 0621 10/19/16 1202  GLUCAP 173* 119* 138*    11. ESRD: Now HD dependent--to schedule HD in afternoons to help with tolerance of therapy during the day. Educate patient and family on renal diet. Electrolyte abnormalities being managed during HD.  12. Anemia of chronic disease:   Hb 10.5 on 11/14   Monitor for signs of bleeding.   On aranesp for erythropoiesis and nulecit for iron load  Labs with HD 13. CAD s/p DES X 2 and MVR: Monitor for symptoms with increase in activity. On plavix and Lipitor.  14. Acute respiratory failure due to acute pulmonary edema:  Fluid overload compensated with HD.  15. AFlutter s/p DCCV/PAF: HR controlled on amiodarone. On coumadin.  16. Persistent cough with dysphonia: Off antibiotics. Repeat CXR 11/12 unremarkable for acute  process  Cont neb  Continue robinul for secretions and Robitussin DM for cough, increased on 11/14  Encourage IS with use of flutter valve  ENT consult pending 17. Sleep disturbance: managed with benadryl prn  LOS (Days) 8 A FACE TO FACE EVALUATION WAS PERFORMED  Charlett Blake 10/19/2016 3:25 PM

## 2016-10-19 NOTE — Progress Notes (Signed)
Dialysis treatment completed.  1000 mL ultrafiltrated.  500 mL net fluid removal.  Patient status unchanged. Lung sounds diminished to ausculation in all fields. Generalized edema. Cardiac: NSR.  Cleansed RIJ catheter with chlorhexidine.  Disconnected lines and flushed ports with saline per protocol.  Ports locked with heparin and capped per protocol.    Report given to bedside, RN Dolores Lory.

## 2016-10-19 NOTE — Progress Notes (Signed)
Patient arrived to unit by bed.  Reviewed treatment plan and this RN agrees with plan.  Report received from bedside RN, Vikki Ports.  Consent verified.  Patient A & O X 4.   Lung sounds clear to ausculation in all fields. Generalized edema. Cardiac:  NSR.  Removed caps and cleansed RIJ catheter with chlorhedxidine.  Aspirated ports of heparin and flushed them with saline per protocol.  Connected and secured lines, initiated treatment at 1637.  UF Goal of 1018mL and net fluid removal 0.5L.  Will continue to monitor.

## 2016-10-19 NOTE — Progress Notes (Signed)
Occupational Therapy Session Note  Patient Details  Name: Deborah [name] MRN: [id number] Date of Birth: [birth year]  Today's Date: 10/19/2016 OT Individual Time: 1100-1200 OT Individual Time Calculation (min): 60 min     Short Term Goals: Week 2:  OT Short Term Goal 1 (Week 2): Pt will tolerate 5 minutes standing at the sink to complete ADL tasks without seated rest break.  OT Short Term Goal 2 (Week 2): Pt will complete LB dressing with Min A OT Short Term Goal 3 (Week 2): Pt will maintain dynamic standing balance during ADL task with overall Min A OT Short Term Goal 4 (Week 2): Pt will complete toileting with Mod A OT Short Term Goal 5 (Week 2): Pt will complete toilet transfer with overall Min A and LRAD  Skilled Therapeutic Interventions/Progress Updates:    1:1 OT session focused on standing endurance, activity tolerance, general strengthening. Pt donned pants seated EOB with Max A to thread LE's through pants and Mod A for dynamic standing balance when pulling pants over hips. Pt brought to therapy gym and ambulated 10 feet w/ RW and Mod A to sit on therapy mat. Cues to reach back for controlled decent into sitting. Pt came to supine with supervision and completed 10 reps x2 sets of heel slides, hip aB/ADduction using maxislide, hip bridge, and straight leg raises. Pt required Min/Mod A to for support to complete bridging and straight leg raises. Standing endurance and static balance activity. Pt tolerated 1 minute standing on first trial, and 3, 30 second standing bouts with seated rest break in between. Pt w/ wide base of support and posterior lean; manual facilitation for proper base of support, weight shifting and postural control.   Returned to room and left semi-reclined in bed with bed alarm on. Slight improvement in frequency of cough today, but very fatigued overall.   Therapy Documentation Precautions:  Precautions Precautions: Fall Restrictions Weight Bearing Restrictions:  No Pain: Pain Assessment Pain Assessment: No/denies pain ADL: ADL ADL Comments: Please see functional navigator  See Function Navigator for Current Functional Status.   Therapy/Group: Individual Therapy  Valma Cava 10/19/2016, 3:52 PM

## 2016-10-19 NOTE — Progress Notes (Signed)
Physical Therapy Weekly Progress Note  Patient Details  Name: Deborah [name] MRN: [id number] Date of Birth: 09/20/1956  Beginning of progress report period: October 12, 2016 End of progress report period: October 19, 2016   Patient has met 1 of 5 short term goals. Patient has made minimal and inconsistent progress this week toward goals due to decreased activity tolerance and disruptive coughing episodes. Patient requires min-mod A overall using RW and demonstrates decreased postural control and core strength with posterior trunk lean. Plan of care changed to 15/7 due to decreased endurance to improve patient's ability to participate in therapy. Patient and family education ongoing.   Patient continues to demonstrate the following deficits: muscle weakness, decreased cardiorespiratory endurance, decreased standing balance, decreased postural control, and decreased balance strategies and therefore will continue to benefit from skilled PT intervention to enhance overall performance with activity tolerance, balance, postural control and ability to compensate for deficits.  Patient not progressing toward long term goals.  See goal revision.  Plan of care revisions: ambulation distance downgraded to 50 ft and transfers downgraded to supervision due to slow pt progress.  PT Short Term Goals Week 1:  PT Short Term Goal 1 (Week 1): Patient will perform transfers with min A.  PT Short Term Goal 1 - Progress (Week 1): Partly met (inconsistent) PT Short Term Goal 2 (Week 1): Patient will ambulate 75 ft with min A.  PT Short Term Goal 2 - Progress (Week 1): Partly met (inconsistent) PT Short Term Goal 3 (Week 1): Patient will maintain dynamic standing balance x 1 min with min A.  PT Short Term Goal 3 - Progress (Week 1): Partly met PT Short Term Goal 4 (Week 1): Patient will negotiate up/down 4 stairs using L rail only with min A.  PT Short Term Goal 4 - Progress (Week 1): Not met PT Short Term Goal 5  (Week 1): Patient will propel wheelchair x 150 ft with supervision for strengthening.  PT Short Term Goal 5 - Progress (Week 1): Met Week 2:  PT Short Term Goal 1 (Week 2): Patient will perform transfers with consistent min A.  PT Short Term Goal 2 (Week 2): Patient will ambulate 75 ft with min A consistently.  PT Short Term Goal 3 (Week 2): Patient will maintain dynamic standing balance x 1 min with min A.  PT Short Term Goal 4 (Week 2): Patient will negotiate up/down 4 stairs using L rail only with min A.   See Function Navigator for Current Functional Status.  Therapy/Group: Individual Therapy  Laretta Alstrom 10/19/2016, 12:25 PM

## 2016-10-20 ENCOUNTER — Inpatient Hospital Stay (HOSPITAL_COMMUNITY): Payer: Self-pay | Admitting: Occupational Therapy

## 2016-10-20 ENCOUNTER — Inpatient Hospital Stay (HOSPITAL_COMMUNITY): Payer: Medicaid Other | Admitting: Speech Pathology

## 2016-10-20 ENCOUNTER — Inpatient Hospital Stay (HOSPITAL_COMMUNITY): Payer: Medicaid Other | Admitting: Physical Therapy

## 2016-10-20 ENCOUNTER — Inpatient Hospital Stay (HOSPITAL_COMMUNITY): Payer: Medicaid Other

## 2016-10-20 LAB — GLUCOSE, CAPILLARY
GLUCOSE-CAPILLARY: 180 mg/dL — AB (ref 65–99)
Glucose-Capillary: 121 mg/dL — ABNORMAL HIGH (ref 65–99)
Glucose-Capillary: 240 mg/dL — ABNORMAL HIGH (ref 65–99)
Glucose-Capillary: 37 mg/dL — CL (ref 65–99)
Glucose-Capillary: 38 mg/dL — CL (ref 65–99)
Glucose-Capillary: 78 mg/dL (ref 65–99)

## 2016-10-20 LAB — PROTIME-INR
INR: 2.2
Prothrombin Time: 24.8 seconds — ABNORMAL HIGH (ref 11.4–15.2)

## 2016-10-20 MED ORDER — GLUCOSE 40 % PO GEL
ORAL | Status: AC
  Administered 2016-10-20: 17:00:00
  Filled 2016-10-20: qty 1

## 2016-10-20 MED ORDER — DEXTROSE 50 % IV SOLN
INTRAVENOUS | Status: AC
  Administered 2016-10-20: 17:00:00
  Filled 2016-10-20: qty 50

## 2016-10-20 MED ORDER — WARFARIN SODIUM 2 MG PO TABS
1.0000 mg | ORAL_TABLET | Freq: Once | ORAL | Status: AC
  Administered 2016-10-20: 1 mg via ORAL

## 2016-10-20 MED ORDER — INSULIN DETEMIR 100 UNIT/ML ~~LOC~~ SOLN
5.0000 [IU] | Freq: Two times a day (BID) | SUBCUTANEOUS | Status: DC
  Administered 2016-10-20 – 2016-10-22 (×3): 5 [IU] via SUBCUTANEOUS
  Filled 2016-10-20 (×5): qty 0.05

## 2016-10-20 NOTE — Progress Notes (Addendum)
[address]       [place] [address]             [phone]         Subjective: Had a bit of a choking episode , she is moving good air and able to speak  Objective: Vital signs in last 24 hours: Temp:  [98.2 F (36.8 C)-98.9 F (37.2 C)] 98.7 F (37.1 C) (11/17 0456) Pulse Rate:  [64-103] 94 (11/17 0456) Resp:  [12-18] 18 (11/17 0456) BP: (84-126)/(51-101) 101/58 (11/17 0456) SpO2:  [95 %-99 %] 99 % (11/17 0456) Weight:  [122 lb 9.2 oz (55.6 kg)-123 lb 14.4 oz (56.2 kg)] 123 lb 14.4 oz (56.2 kg) (11/17 0456)  Hemodynamic parameters for last 24 hours:    Intake/Output from previous day: 11/16 0701 - 11/17 0700 In: 390 [P.O.:390] Out: 500  Intake/Output this shift: Total I/O In: 120 [P.O.:120] Out: -   General appearance: alert, cooperative and uncomfortable from coughing Heart: regular rate and rhythm Lungs: some upper airway congestion Abdomen: soft Extremities: no edema Wound: incis healing well  Lab Results:  Recent Labs  10/19/16 1644  WBC 13.6*  HGB 10.2*  HCT 31.9*  PLT 460*   BMET:  Recent Labs  10/18/16 0630 10/19/16 1643  NA 131* 128*  K 4.7 4.7  CL 91* 90*  CO2 26 24  GLUCOSE 116* 79  BUN 12 39*  CREATININE 3.16* 5.69*  CALCIUM 8.7* 8.7*    PT/INR:  Recent Labs  10/20/16 0658  LABPROT 24.8*  INR 2.20   ABG    Component Value Date/Time   PHART 7.487 (H) 10/04/2016 1430   HCO3 28.4 (H) 10/04/2016 1430   TCO2 26 09/08/2016 0412   ACIDBASEDEF 4.1 (H) 09/02/2016 1850   O2SAT 95.5 10/04/2016 1430   CBG (last 3)   Recent Labs  10/19/16 2117 10/20/16 0629 10/20/16 1141  GLUCAP 80 121* 240*    Meds Scheduled Meds: . amiodarone  200 mg Oral BID  . atorvastatin  20 mg Oral q1800  . clopidogrel  75 mg Oral Daily  . darbepoetin (ARANESP) injection - DIALYSIS  100 mcg Intravenous Q Tue-HD  . famotidine  20 mg Oral Daily  . feeding supplement  1 Container Oral TID BM  . feeding supplement  (PRO-STAT SUGAR FREE 64)  30 mL Oral TID  . ferric gluconate (FERRLECIT/NULECIT) IV  250 mg Intravenous Q T,Th,Sa-HD  . gabapentin  300 mg Oral QHS  . Gerhardt's butt cream   Topical BID  . guaiFENesin  600 mg Oral Q6H  . hydrocortisone   Rectal BID  . insulin aspart  0-5 Units Subcutaneous QHS  . insulin aspart  0-9 Units Subcutaneous TID WC  . insulin detemir  8 Units Subcutaneous BID  . lanthanum  1,000 mg Oral TID WC  . metoCLOPramide  5 mg Oral TID AC & HS  . midodrine  10 mg Oral q morning - 10a  . multivitamin  1 tablet Oral QHS  . pantoprazole  20 mg Oral BID  . sodium chloride flush  10-40 mL Intracatheter Q12H  . warfarin  1 mg Oral ONCE-1800  . Warfarin - Pharmacist Dosing Inpatient   Does not apply q1800   Continuous Infusions: PRN Meds:.acetaminophen, albuterol, ALPRAZolam, bisacodyl, diphenhydrAMINE, diphenhydrAMINE, diphenhydrAMINE, guaiFENesin-dextromethorphan, HYDROcodone-acetaminophen, prochlorperazine **OR** prochlorperazine **OR** prochlorperazine, sodium chloride flush, traZODone  Xrays No results found.  Assessment/Plan:  Generally conts to feel well and strength, etc improving with therapies  INR 2.2 Swallowing conts to be an issue- SLP monitoring closely Medical management per primary SVC   LOS: 9 days    GOLD,WAYNE E 10/20/2016 Progressing in rehabilitation but with persistent coughing issues possibly related to tracheostomy. We'll get neck CT scan to assess airway anatomy, rule out stenosis or granulation tissue Tharon Aquas trigt M.D.

## 2016-10-20 NOTE — Progress Notes (Signed)
Nutrition Follow-up  DOCUMENTATION CODES:   Non-severe (moderate) malnutrition in context of chronic illness  INTERVENTION:  Continue Boost Breeze po TID, each supplement provides 250 kcal and 9 grams of protein.  Continue 30 ml Prostat po TID, each supplement provides 100 kcal and 15 grams of protein.  Encourage adequate PO intake.   NUTRITION DIAGNOSIS:   Malnutrition related to chronic illness as evidenced by moderate depletion of body fat, moderate depletions of muscle mass; ongoing  GOAL:   Patient will meet greater than or equal to 90% of their needs; met  MONITOR:   PO intake, Supplement acceptance, Diet advancement, Labs, Weight trends, Skin, I & O's  REASON FOR ASSESSMENT:   Consult Calorie Count (poor po)  ASSESSMENT:   60 y.o. female with history of T2DM, HTN, CKD stage III with medical non-compliance who was originally admitted to [facility] 08/15/16 with NSTEMI and underwent cardiac cath with stenting.  She developed acute renal failure with metabolic acidosis and oliguria requiring CRRT. She went on to develop cardiogenic shock requiring intubation and was transferred to [facility] on 08/18/16 for treatment. She underwent mitral valve repair by Prescott Gum on 9/21.  Right IJ placed on 9/28 and HD initiated.  She underwent tracheostomy on 10/5 and was weaned to ATC and tolerating PMSV for short periods of time. Has completed treatment for enterobacter HCAP and was decannualted on 10/25. Panda removed due to concerns of aspiration. Follow up MBS done today and diet advanced to dysphagia 3, thin. Therapy ongoing and patient with weakness with balance and proprioceptive deficits, scissoring gait as well as dysphagia.  Diet has been changed to dysphagia 2 diet with thin liquids to help with fatigue. Meal completion has been varied from 10-75%, however po intake today has been 50-75%. Pt currently has Boost Breeze and Prostat ordered and has been consuming them. RD to continue with current  orders. Pt encouraged to eat her foods at meals and to drink her supplements.   Labs and medications reviewed. Phosphorous elevated at 4.8.  Diet Order:  DIET DYS 2 Room service appropriate? Yes; Fluid consistency: Thin; Fluid restriction: 1200 mL Fluid  Skin:  Reviewed, no issues  Last BM:  11/16  Height:   Ht Readings from Last 1 Encounters:  10/11/16 5' 1"  (1.549 m)    Weight:   Wt Readings from Last 1 Encounters:  10/20/16 123 lb 14.4 oz (56.2 kg)    Ideal Body Weight:  47.7 kg  BMI:  Body mass index is 23.41 kg/m.  Estimated Nutritional Needs:   Kcal:  1700-1900  Protein:  80-100 grams  Fluid:  1.2 L/day  EDUCATION NEEDS:   No education needs identified at this time  Corrin Parker, MS, RD, LDN Pager # [phone] After hours/ weekend pager # [phone]

## 2016-10-20 NOTE — Progress Notes (Signed)
Speech Language Pathology Daily Session Note  Patient Details  Name: Deborah [name] MRN: [id number] Date of Birth: [birth year]  Today's Date: 10/20/2016 SLP Individual Time: 2878-6767 SLP Individual Time Calculation (min): 45 min   Short Term Goals: Week 2: SLP Short Term Goal 1 (Week 2): Pt will recall new information with supervision verbal cues for use of external aids.  SLP Short Term Goal 2 (Week 2): Pt will observe swallow precautions with PO intake at mod I level.  SLP Short Term Goal 3 (Week 2): Pt will tolerate trials of Dys 3 and regular with oral management WFL at min A.  SLP Short Term Goal 4 (Week 2): Pt will demonstrate functional problem solving with min A. SLP Short Term Goal 5 (Week 2): Pt will return demonstration of vocal hygiene techniques with min assist verbal cues.    Skilled Therapeutic Interventions:Skilled therapy intervention focused on cognitive goals. Patient with continuing intermittent cough throughout session. She reports cough syrup was somewhat effective to increase sleep last night. Mod A to organize information and apply reasoning to moderate level scheduling task. Pt with difficulty integrating important information to arrive at a solution.    Function:  Eating Eating                 Cognition Comprehension Comprehension assist level: Follows basic conversation/direction with extra time/assistive device  Expression   Expression assist level: Expresses basic 90% of the time/requires cueing < 10% of the time.  Social Interaction Social Interaction assist level: Interacts appropriately with others with medication or extra time (anti-anxiety, antidepressant).  Problem Solving Problem solving assist level: Solves basic 50 - 74% of the time/requires cueing 25 - 49% of the time  Memory Memory assist level: Recognizes or recalls 50 - 74% of the time/requires cueing 25 - 49% of the time    Pain Pain Assessment Pain Assessment: No/denies  pain  Therapy/Group: Individual Therapy  Vinetta Bergamo MA, CCC-SLP 10/20/2016, 10:29 AM

## 2016-10-20 NOTE — Progress Notes (Signed)
Physical Therapy Session Note  Patient Details  Name: Deborah [name] MRN: [id number] Date of Birth: 1956/10/13  Today's Date: 10/20/2016 PT Individual Time: 9357-0177 and 1300-1400 PT Individual Time Calculation (min): 23 min and 60 min   Short Term Goals: Week 2:  PT Short Term Goal 1 (Week 2): Patient will perform transfers with consistent min A.  PT Short Term Goal 2 (Week 2): Patient will ambulate 75 ft with min A consistently.  PT Short Term Goal 3 (Week 2): Patient will maintain dynamic standing balance x 1 min with min A.  PT Short Term Goal 4 (Week 2): Patient will negotiate up/down 4 stairs using L rail only with min A.   Skilled Therapeutic Interventions/Progress Updates:    Treatment 1: Patient in bed upon arrival after finishing breakfast. Patient donned socks in supine and sitting EOB with increased time and effort and multiple attempts with assist for R sock. Patient performed stand pivot transfer to wheelchair with min A and cues for technique. Performed grooming tasks at sink from wheelchair level with more than reasonable time. Patient requires cues for pursed lip breathing throughout session with no carryover noted and decreased coughing compared to earlier in the week. Patient requires increased time to complete all tasks due to DOE and fatigue. Patient agreeable to sitting up in recliner for increased OOB tolerance and left with BLE elevated and needs in reach.   Treatment 2: Patient in recliner upon arrival with increased coughing compared to AM session but appeared more alert and engaged this date. Patient's daughter requesting update, patient called daughter and education provided regarding progress to date. Patient performed stand pivot transfers with min A. Patient propelled wheelchair to and from gym using BUE with increased time and supervision. Gait training using RW with steady assist and intermittent supervision x 60 ft + 150 ft with improved safety and RLE  clearance noted. Patient negotiated up/down 4 (6") stairs using 2 rails x 2 with mod A and step-to pattern, cues for advancing BUE along rails and activating BLE. Seated kinetron from wheelchair level for BLE neuro re-ed and muscular endurance at 40 cm sec, multiple trials to fatigue for a total of 10 min. Patient continues to require prolonged seated rest between mobility tasks due to fatigue and SOB. Patient returned to bed at end of session with son present and needs in reach.   Therapy Documentation Precautions:  Precautions Precautions: Fall Restrictions Weight Bearing Restrictions: No Pain: Pain Assessment Pain Assessment: No/denies pain   See Function Navigator for Current Functional Status.   Therapy/Group: Individual Therapy  Laretta Alstrom 10/20/2016, 8:33 AM

## 2016-10-20 NOTE — Progress Notes (Signed)
Patient with hypoglycemic episode and low BS in past 24 hours. Will reduce lantus to 5 units bid and monitor. Po intake continues to be variable.

## 2016-10-20 NOTE — Progress Notes (Signed)
ANTICOAGULATION CONSULT NOTE - Follow Up Consult  Pharmacy Consult for coumadin Indication: atrial fibrillation  No Known Allergies  Patient Measurements: Height: 5\' 1"  (154.9 cm) Weight: 123 lb 14.4 oz (56.2 kg) IBW/kg (Calculated) : 47.8 Heparin Dosing Weight:   Vital Signs: Temp: 98.7 F (37.1 C) (11/17 0456) Temp Source: Oral (11/17 0456) BP: 101/58 (11/17 0456) Pulse Rate: 94 (11/17 0456)  Labs:  Recent Labs  10/18/16 0630 10/19/16 0518 10/19/16 1643 10/19/16 1644 10/20/16 0658  HGB  --   --   --  10.2*  --   HCT  --   --   --  31.9*  --   PLT  --   --   --  460*  --   LABPROT 46.7* 38.1*  --   --  24.8*  INR 5.02* 3.77  --   --  2.20  CREATININE 3.16*  --  5.69*  --   --     Estimated Creatinine Clearance: 8 mL/min (by C-G formula based on SCr of 5.69 mg/dL (H)).   Medications:  Scheduled:  . amiodarone  200 mg Oral BID  . atorvastatin  20 mg Oral q1800  . clopidogrel  75 mg Oral Daily  . darbepoetin (ARANESP) injection - DIALYSIS  100 mcg Intravenous Q Tue-HD  . famotidine  20 mg Oral Daily  . feeding supplement  1 Container Oral TID BM  . feeding supplement (PRO-STAT SUGAR FREE 64)  30 mL Oral TID  . ferric gluconate (FERRLECIT/NULECIT) IV  250 mg Intravenous Q T,Th,Sa-HD  . gabapentin  300 mg Oral QHS  . Gerhardt's butt cream   Topical BID  . glycopyrrolate  1 mg Per Tube Q breakfast  . guaiFENesin  600 mg Oral Q6H  . hydrocortisone   Rectal BID  . insulin aspart  0-5 Units Subcutaneous QHS  . insulin aspart  0-9 Units Subcutaneous TID WC  . insulin detemir  8 Units Subcutaneous BID  . lanthanum  1,000 mg Oral TID WC  . metoCLOPramide  5 mg Oral TID AC & HS  . midodrine  10 mg Oral q morning - 10a  . multivitamin  1 tablet Oral QHS  . pantoprazole  20 mg Oral BID  . sodium chloride flush  10-40 mL Intracatheter Q12H  . Warfarin - Pharmacist Dosing Inpatient   Does not apply q1800   Infusions:    Assessment: 60 yo female with afib is  currently on therapeutic coumadin.  INR is now down to 2.2 from 3.77  Goal of Therapy:  INR 2 - 2.5 Monitor platelets by anticoagulation protocol: Yes   Plan:  - coumadin 1 mg po x1 - INR in am  Shaylon Aden, Tsz-Yin 10/20/2016,8:23 AM

## 2016-10-20 NOTE — Progress Notes (Signed)
Patient ID: Deborah [name], female   DOB: [birth year], 60 y.o.   MRN: [id number]   [facility] Progress Note   Subjective:    Off floor.    Objective:   BP (!) 96/54 (BP Location: Right Arm)   Pulse 88   Temp 98.9 F (37.2 C) (Oral)   Resp 17   Ht 5\' 1"  (1.549 m)   Wt 56.2 kg (123 lb 14.4 oz)   SpO2 94%   BMI 23.41 kg/m   Physical Exam:  Pt unavailable for exam 11/17 VS as noted NAD, ssleeping Prev trach site closed, + dry cough with inspiratory upper airway sounds Chest is clear anteriorly, muffled at bases S1S2 No S3 Healed sternotomy scar Abd soft and not tender No edema of LE's Left upper arm AVG + bruit (10/30) Left sided TDC (9/28 VVS)   Recent Labs Lab 10/14/16 0411 10/15/16 0319 10/15/16 0735 10/16/16 0607 10/17/16 0455 10/17/16 0603 10/18/16 0630 10/19/16 1643  NA 132* 135 134* 131* 132* 133* 131* 128*  K 4.0 3.6 3.8 4.3 4.3 4.2 4.7 4.7  CL 94* 95* 95* 94* 93* 95* 91* 90*  CO2 24 25 23 23 24 25 26 24   GLUCOSE 212* 93 134* 167* 84 79 116* 79  BUN 25* 34* 5* 21* 37* 38* 12 39*  CREATININE 4.41* 5.76* 2.05* 4.01* 5.38* 5.79* 3.16* 5.69*  CALCIUM 8.5* 8.5* 8.5* 8.6* 8.5* 8.5* 8.7* 8.7*  PHOS 8.7* 9.2* 3.1 5.4* 6.2*  --  5.0* 4.8*   Iron/TIBC/Ferritin/ %Sat    Component Value Date/Time   IRON 22 (L) 10/10/2016 0356   TIBC 326 10/10/2016 0356   FERRITIN 272 10/10/2016 0356   IRONPCTSAT 7 (L) 10/10/2016 0356   Results for TREVIA, NOP (MRN [id number]) as of 10/12/2016 08:46  Ref. Range 10/10/2016 03:56  PTH Latest Ref Range: 15 - 65 pg/mL 199 (H)    Recent Labs Lab 10/17/16 0603 10/19/16 1644  WBC 15.2* 13.6*  NEUTROABS 12.1*  --   HGB 10.5* 10.2*  HCT 33.3* 31.9*  MCV 86.3 85.8  PLT 498* 460*    Lab Results  Component Value Date   INR 2.20 10/20/2016   INR 3.77 10/19/2016   INR 5.02 (HH) 10/18/2016    Medications:    . amiodarone  200 mg Oral BID  . atorvastatin  20 mg Oral q1800  . clopidogrel  75 mg Oral  Daily  . darbepoetin (ARANESP) injection - DIALYSIS  100 mcg Intravenous Q Tue-HD  . famotidine  20 mg Oral Daily  . feeding supplement  1 Container Oral TID BM  . feeding supplement (PRO-STAT SUGAR FREE 64)  30 mL Oral TID  . ferric gluconate (FERRLECIT/NULECIT) IV  250 mg Intravenous Q T,Th,Sa-HD  . gabapentin  300 mg Oral QHS  . Gerhardt's butt cream   Topical BID  . guaiFENesin  600 mg Oral Q6H  . hydrocortisone   Rectal BID  . insulin aspart  0-5 Units Subcutaneous QHS  . insulin aspart  0-9 Units Subcutaneous TID WC  . insulin detemir  5 Units Subcutaneous BID  . lanthanum  1,000 mg Oral TID WC  . metoCLOPramide  5 mg Oral TID AC & HS  . midodrine  10 mg Oral q morning - 10a  . multivitamin  1 tablet Oral QHS  . pantoprazole  20 mg Oral BID  . sodium chloride flush  10-40 mL Intracatheter Q12H  . Warfarin - Pharmacist Dosing Inpatient   Does not apply  q1800   Background: 60 y/o AAF with DM2, HTN, HLD presented to [facility] ER with infposterior STEMI on 9/12->cath->. PCS with stent x 2 by Dr. Clayborn Bigness. EF 55-60% with severe MR.  Post-cath respiratory failure ->intubation. Shock and renal failure (creatinine 1.6 on adm). Trialysis catheter placed 9/14 and pt had CRRT at [facility] for 48 hours prior to transfer here on 08/18/16->back to cath lab -> ruptured posterior papillary muscle with severe MR->IABP/Swan. We were asked to see d/t progressive rise in creatinine/volume overload/anuric AKI .  CRRT re-initiated 08/21/16-9/20, then 9/21 post MVR. Transitioned to IHD.  Dialysis dependent since 08/17/16. No recovery. New ESRD.  Assessment/ Plan:    1. 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 Amio. Coumadin. INR supratherapeutic. Pharmacy managing. Using midodrine for BP.  2. 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  TTS schedule via Left TDC (9/28 Scot Dock). S/P LUA AVG placed on 10/30 (brachioaxillary) by Dr. Donzetta Matters - will be ready by Nov 30. EDW  looks to be about 57 kg. (outpt has spot [facility] TTS)  Next HD 11/18 3. Anemia: 2/2  ESRD/Fe def - on darbe 100 QTuesday. TSat 7%. Fe load with HD. Last Hb 10.2 4. CKD-MBD: Phos high. Started lanthanum 1g AC. Phos has been labile, likely related to PO intake. PTH 199 no intervention needed yet. 2Ca bath with HD.  5. Nutrition: Low albumin secondary to critical illness/prolonged hospitalization, (Nepro/continue renal vitamin) 6. Cough/intermittent dyspnea: Last CXR 11/5 clear, no edema.  Has some inspiratory upper airway sounds.  ? Tracheal stenosis or tracheomalacia?.  ENT rec'd neurontin 300 mg daily, if pt shows signs of altered sensorium would decrease this to 100 mg daily--> gabapentin exclusively renally cleared.  Getting CT neck for eval today. 7. Disposition - Cont rehab. Eventual home, HD at [place], [place] [facility] [phone] Pager 10/20/2016, 10:41 PM

## 2016-10-20 NOTE — Progress Notes (Signed)
Social Work Patient ID: Deborah [name], female   DOB: [birth year], 60 y.o.   MRN: [id number]  Met with pt, Deborah [name]-daughter and Deborah [name]-son to discuss team conference goals supervision level and target discharge 11/29. Pt has Medicaid and disability pending but daughter is concerned about the cost of medications. Can get one month through the [employer] program and can pursue the [facility] in [place] was going there Prior to admission. Daughter to contact MA worker and see the status of her application. Will work on plans and son to be with for the first two weeks. They were asking how long does she need supervision, unsure. Continue to work on plans.

## 2016-10-20 NOTE — Progress Notes (Signed)
Occupational Therapy Session Note  Patient Details  Name: Deborah [name] MRN: [id number] Date of Birth: Apr 19, 1956  Today's Date: 10/20/2016 OT Individual Time: 1050-1130 OT Individual Time Calculation (min): 40 min     Short Term Goals: Week 2:  OT Short Term Goal 1 (Week 2): Pt will tolerate 5 minutes standing at the sink to complete ADL tasks without seated rest break.  OT Short Term Goal 2 (Week 2): Pt will complete LB dressing with Min A OT Short Term Goal 3 (Week 2): Pt will maintain dynamic standing balance during ADL task with overall Min A OT Short Term Goal 4 (Week 2): Pt will complete toileting with Mod A OT Short Term Goal 5 (Week 2): Pt will complete toilet transfer with overall Min A and LRAD  Skilled Therapeutic Interventions/Progress Updates:    Treatment session with focus on activity tolerance, standing balance, and increased independence with LB bathing and dressing.  Completed bathing at sit > stand level at sink with frequent seated rest breaks due to decreased endurance.  Pt completed sit <> stand with min assist - supervision throughout session, often requiring min assist for standing balance the more time she stood.  Provided pt with long handled sponge to increase independence with LB bathing.  Pt able to thread and pull up pants this session with min/steady assist for standing balance, requiring assist with donning socks due to time constraints.  Returned to recliner stand pivot with min assist for stability.  Pt left upright with family members in to visit.  Therapy Documentation Precautions:  Precautions Precautions: Fall Restrictions Weight Bearing Restrictions: No Pain: Pain Assessment Pain Assessment: No/denies pain  See Function Navigator for Current Functional Status.   Therapy/Group: Individual Therapy  Simonne Come 10/20/2016, 12:32 PM

## 2016-10-21 ENCOUNTER — Inpatient Hospital Stay (HOSPITAL_COMMUNITY): Payer: Medicaid Other | Admitting: Physical Therapy

## 2016-10-21 ENCOUNTER — Inpatient Hospital Stay (HOSPITAL_COMMUNITY): Payer: Medicaid Other

## 2016-10-21 ENCOUNTER — Inpatient Hospital Stay (HOSPITAL_COMMUNITY): Payer: Self-pay | Admitting: Occupational Therapy

## 2016-10-21 LAB — GLUCOSE, CAPILLARY
GLUCOSE-CAPILLARY: 79 mg/dL (ref 65–99)
GLUCOSE-CAPILLARY: 68 mg/dL (ref 65–99)
GLUCOSE-CAPILLARY: 85 mg/dL (ref 65–99)
Glucose-Capillary: 119 mg/dL — ABNORMAL HIGH (ref 65–99)
Glucose-Capillary: 297 mg/dL — ABNORMAL HIGH (ref 65–99)

## 2016-10-21 LAB — CBC
HEMATOCRIT: 31 % — AB (ref 36.0–46.0)
Hemoglobin: 9.8 g/dL — ABNORMAL LOW (ref 12.0–15.0)
MCH: 27.6 pg (ref 26.0–34.0)
MCHC: 31.6 g/dL (ref 30.0–36.0)
MCV: 87.3 fL (ref 78.0–100.0)
Platelets: 455 10*3/uL — ABNORMAL HIGH (ref 150–400)
RBC: 3.55 MIL/uL — AB (ref 3.87–5.11)
RDW: 16 % — AB (ref 11.5–15.5)
WBC: 11.9 10*3/uL — AB (ref 4.0–10.5)

## 2016-10-21 LAB — RENAL FUNCTION PANEL
ALBUMIN: 2.5 g/dL — AB (ref 3.5–5.0)
Anion gap: 12 (ref 5–15)
BUN: 27 mg/dL — AB (ref 6–20)
CHLORIDE: 92 mmol/L — AB (ref 101–111)
CO2: 24 mmol/L (ref 22–32)
CREATININE: 5.38 mg/dL — AB (ref 0.44–1.00)
Calcium: 8.5 mg/dL — ABNORMAL LOW (ref 8.9–10.3)
GFR, EST AFRICAN AMERICAN: 9 mL/min — AB (ref >60)
GFR, EST NON AFRICAN AMERICAN: 8 mL/min — AB (ref >60)
Glucose, Bld: 84 mg/dL (ref 65–99)
PHOSPHORUS: 3.3 mg/dL (ref 2.5–4.6)
POTASSIUM: 4.1 mmol/L (ref 3.5–5.1)
Sodium: 128 mmol/L — ABNORMAL LOW (ref 135–145)

## 2016-10-21 LAB — PROTIME-INR
INR: 2.11
Prothrombin Time: 24 seconds — ABNORMAL HIGH (ref 11.4–15.2)

## 2016-10-21 MED ORDER — WARFARIN SODIUM 2 MG PO TABS
1.0000 mg | ORAL_TABLET | Freq: Once | ORAL | Status: AC
  Administered 2016-10-21: 1 mg via ORAL
  Filled 2016-10-21: qty 0.5

## 2016-10-21 NOTE — Progress Notes (Signed)
Patient ID: Deborah [name], female   DOB: 1956/03/06, 61 y.o.   MRN: [id number]  [facility] Progress Note   Subjective:    Sleeping this AM with son at bedside.  CT neck shows possible mediastinal mass vs very large PA and narrow L sided bronchus.  Trach site without stenosis.    Objective:   BP (!) 97/57 (BP Location: Right Arm)   Pulse 88   Temp 98.9 F (37.2 C) (Oral)   Resp 16   Ht 5\' 1"  (1.549 m)   Wt 56.2 kg (123 lb 14.4 oz)   SpO2 97%   BMI 23.41 kg/m   Physical Exam:  VS as noted NAD, sleeping Prev trach site closed, intermittent coughing, less than before Normal WOB S1S2 No S3 Healed sternotomy scar Abd soft and not tender No edema of LE's Left upper arm AVG + bruit (10/30) Left sided TDC (9/28 VVS)   Recent Labs Lab 10/15/16 0319 10/15/16 0735 10/16/16 0607 10/17/16 0455 10/17/16 0603 10/18/16 0630 10/19/16 1643  NA 135 134* 131* 132* 133* 131* 128*  K 3.6 3.8 4.3 4.3 4.2 4.7 4.7  CL 95* 95* 94* 93* 95* 91* 90*  CO2 25 23 23 24 25 26 24   GLUCOSE 93 134* 167* 84 79 116* 79  BUN 34* 5* 21* 37* 38* 12 39*  CREATININE 5.76* 2.05* 4.01* 5.38* 5.79* 3.16* 5.69*  CALCIUM 8.5* 8.5* 8.6* 8.5* 8.5* 8.7* 8.7*  PHOS 9.2* 3.1 5.4* 6.2*  --  5.0* 4.8*   Iron/TIBC/Ferritin/ %Sat    Component Value Date/Time   IRON 22 (L) 10/10/2016 0356   TIBC 326 10/10/2016 0356   FERRITIN 272 10/10/2016 0356   IRONPCTSAT 7 (L) 10/10/2016 0356   Results for Deborah [name], Deborah [name] (MRN [id number]) as of 10/12/2016 08:46  Ref. Range 10/10/2016 03:56  PTH Latest Ref Range: 15 - 65 pg/mL 199 (H)    Recent Labs Lab 10/17/16 0603 10/19/16 1644  WBC 15.2* 13.6*  NEUTROABS 12.1*  --   HGB 10.5* 10.2*  HCT 33.3* 31.9*  MCV 86.3 85.8  PLT 498* 460*    Lab Results  Component Value Date   INR 2.11 10/21/2016   INR 2.20 10/20/2016   INR 3.77 10/19/2016    Medications:    . amiodarone  200 mg Oral BID  . atorvastatin  20 mg Oral q1800  . clopidogrel  75  mg Oral Daily  . darbepoetin (ARANESP) injection - DIALYSIS  100 mcg Intravenous Q Tue-HD  . famotidine  20 mg Oral Daily  . feeding supplement  1 Container Oral TID BM  . feeding supplement (PRO-STAT SUGAR FREE 64)  30 mL Oral TID  . gabapentin  300 mg Oral QHS  . Gerhardt's butt cream   Topical BID  . guaiFENesin  600 mg Oral Q6H  . hydrocortisone   Rectal BID  . insulin aspart  0-5 Units Subcutaneous QHS  . insulin aspart  0-9 Units Subcutaneous TID WC  . insulin detemir  5 Units Subcutaneous BID  . lanthanum  1,000 mg Oral TID WC  . metoCLOPramide  5 mg Oral TID AC & HS  . midodrine  10 mg Oral q morning - 10a  . multivitamin  1 tablet Oral QHS  . pantoprazole  20 mg Oral BID  . sodium chloride flush  10-40 mL Intracatheter Q12H  . warfarin  1 mg Oral ONCE-1800  . Warfarin - Pharmacist Dosing Inpatient   Does not apply [phone]  Background: 60 y/o AAF with DM2, HTN, HLD presented to [facility] ER with infposterior STEMI on 9/12->cath->. PCS with stent x 2 by Dr. Clayborn Bigness. EF 55-60% with severe MR.  Post-cath respiratory failure ->intubation. Shock and renal failure (creatinine 1.6 on adm). Trialysis catheter placed 9/14 and pt had CRRT at [facility] for 48 hours prior to transfer here on 08/18/16->back to cath lab -> ruptured posterior papillary muscle with severe MR->IABP/Swan. We were asked to see d/t progressive rise in creatinine/volume overload/anuric AKI .  CRRT re-initiated 08/21/16-9/20, then 9/21 post MVR. Transitioned to IHD.  Dialysis dependent since 08/17/16. No recovery. New ESRD.  Assessment/ Plan:    1. 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 Amio. Coumadin.  Using midodrine for BP.  2. 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  TTS schedule via Left TDC (9/28 Scot Dock). S/P LUA AVG placed on 10/30 (brachioaxillary) by Dr. Donzetta Matters - will be ready by Nov 30. EDW looks to be about 57 kg. (outpt has spot [facility] TTS)   Next HD 11/18 3. Anemia: 2/2  ESRD/Fe def - on darbe 100 QTuesday. TSat 7%. Fe load with HD. Last Hb 10.2 4. CKD-MBD: Phos high. Started lanthanum 1g AC. Phos has been labile, likely related to PO intake. PTH 199 no intervention needed yet. 2Ca bath with HD.  5. Nutrition: Low albumin secondary to critical illness/prolonged hospitalization, (Nepro/continue renal vitamin) 6. Cough/intermittent dyspnea: Last CXR 11/5 clear, no edema.  Has some inspiratory upper airway sounds.  CT neck showing large PA vs mediastinal mass.  CT surg following.  Have ordered CT chest without contrast 7. Disposition - Cont rehab. Eventual home, HD at [place], [place] [facility] [phone] Pager 10/21/2016, 1:20 PM

## 2016-10-21 NOTE — Progress Notes (Signed)
Called CT report to Dr. Naaman Plummer, No new orders, ok to give meds. Deborah [name]

## 2016-10-21 NOTE — Progress Notes (Signed)
Physical Therapy Session Note  Patient Details  Name: Deborah [name] MRN: [id number] Date of Birth: 1956-11-05  Today's Date: 10/21/2016 PT Individual Time: 7017-7939 PT Individual Time Calculation (min): 28 min  PT Concurrent Time: 856-930 PT Concurrent Time Calculation: 34 min Total of 62 min  Short Term Goals: Week 2:  PT Short Term Goal 1 (Week 2): Patient will perform transfers with consistent min A.  PT Short Term Goal 2 (Week 2): Patient will ambulate 75 ft with min A consistently.  PT Short Term Goal 3 (Week 2): Patient will maintain dynamic standing balance x 1 min with min A.  PT Short Term Goal 4 (Week 2): Patient will negotiate up/down 4 stairs using L rail only with min A.   Skilled Therapeutic Interventions/Progress Updates:    Session focused on mobility training, generalized strengthening, standing balance, and activity tolerance. Patient reported feeling weak and having a cold but agreeable to therapy. Patient performed stand pivot transfers with min A, propelled wheelchair to and from gym using BUE with supervision and increased time, gait training using RW x 110 ft with supervision-min A as pt fatigued and x 80 ft with steady assist, stair training up/down 4 (6") stairs using 2 rails with step-to pattern x 2 with min A overall, seated BLE therex for LAQ x 20 each LE, hip flexion x 20 each LE, hip adduction ball squeezes with 3 sec hold x 20, and hip abduction with theraband with 3 sec hold x 20, and standing without UE support to throw horse shoes with mod-max A and verbal/visual cues for postural control. Patient required prolonged seated rest breaks with cues for pursed lip breathing throughout session due to DOE and fatigue. Patient left in bed with son present and needs in reach.   Therapy Documentation Precautions:  Precautions Precautions: Fall Restrictions Weight Bearing Restrictions: No Pain: Pain Assessment Pain Assessment: No/denies pain   See Function  Navigator for Current Functional Status.   Therapy/Group: Individual Therapy  Deborah [name] 10/21/2016, 10:05 AM

## 2016-10-21 NOTE — Progress Notes (Signed)
Occupational Therapy Session Note  Patient Details  Name: Deborah [name] MRN: [id number] Date of Birth: 02/27/1956  Today's Date: 10/21/2016 OT Individual Time: 3646-8032 OT Individual Time Calculation (min): 33 min   Short Term Goals: Week 2:  OT Short Term Goal 1 (Week 2): Pt will tolerate 5 minutes standing at the sink to complete ADL tasks without seated rest break.  OT Short Term Goal 2 (Week 2): Pt will complete LB dressing with Min A OT Short Term Goal 3 (Week 2): Pt will maintain dynamic standing balance during ADL task with overall Min A OT Short Term Goal 4 (Week 2): Pt will complete toileting with Mod A OT Short Term Goal 5 (Week 2): Pt will complete toilet transfer with overall Min A and LRAD  Skilled Therapeutic Interventions/Progress Updates:   ADL-retraining with focus on improved activity tolerance, dynamic standing balance, transfers.   Pt received in bed, HOB elevated but struggling to complete self-feeding d/t poor positioning.   After setup to place w/c beside bed, pt terminated self-feeding and accepted setup for planned BADL at sink.   Pt required overall mod-min assist to roll to her left and exit bed, performing SPT with steadying assisit.   Pt required frequent rest breaks d/t upper respiratory congestion, coughing frequently until expelling sputum.   Pt then performed bathing/dressing at sink with overall mod assist to wash lower legs.   Pt declined to wash buttocks and periarea d/t new brief provided prior to treatment.   Pt completed sit>stand 2 times during session with tactile cues for hand placement and steadying assist to maintain standing balance during assisted dressing.   Pt left in w/c at end of session at bedside with all needs placed within reach.  Therapy Documentation Precautions:  Precautions Precautions: Fall Restrictions Weight Bearing Restrictions: No  Vital Signs: Therapy Vitals Temp: 98.9 F (37.2 C) Temp Source: Oral Pulse Rate: 88 Resp:  16 BP: (!) 97/57 Patient Position (if appropriate): Lying Oxygen Therapy SpO2: 97 % O2 Device: Not Delivered   Pain: No/denies pain     ADL: ADL ADL Comments: Please see functional navigator   See Function Navigator for Current Functional Status.   Therapy/Group: Individual Therapy  Mykayla Brinton 10/21/2016, 8:53 AM

## 2016-10-21 NOTE — Progress Notes (Signed)
[place] PHYSICAL MEDICINE & REHABILITATION     PROGRESS NOTE  Subjective/Complaints:  Sitting up eating breakfast. Denies any issues. No cough today. Hypoglycemic yesterday  ROS: Pt denies fever, rash/itching, headache, blurred or double vision, nausea, vomiting, abdominal pain, diarrhea, chest pain, shortness of breath, palpitations, dysuria, dizziness, neck pain, back pain, bleeding, anxiety, or depression   Objective: Vital Signs: Blood pressure (!) 97/57, pulse 88, temperature 98.9 F (37.2 C), temperature source Oral, resp. rate 16, height 5\' 1"  (1.549 m), weight 56.2 kg (123 lb 14.4 oz), SpO2 97 %. Ct Soft Tissue Neck Wo Contrast  Result Date: 10/20/2016 CLINICAL DATA:  None. FINDINGS: Pharynx and larynx: Layering secretions in the hypopharynx extending to the bilateral piriform sinus. Normal larynx. Mild proximal tracheal wall irregularity without stenosis. Salivary glands: Normal. Thyroid: Normal. Lymph nodes: No lymphadenopathy by CT size criteria though sensitivity decreased without contrast. Vascular: Retropharyngeal course of the carotid arteries. Tunneled dialysis catheter via LEFT internal jugular venous approach. Limited intracranial: Nonacute. Mild calcific atherosclerosis of the carotid siphons. Visualized orbits: Normal. Mastoids and visualized paranasal sinuses: Mild bilateral maxillary sinus mucosal thickening without air-fluid levels. Mastoid air cells are well aerated. Skeleton: Broad reversed cervical lordosis with moderate to severe C5-6 and C6-7 degenerative discs. Status post median sternotomy. Tooth 8 and LEFT mandible molar periapical lucency/abscess. Upper chest: Lung apices are clear. Markedly enlarged partially imaged main pulmonary artery versus mediastinal mass. Asymmetrically smaller LEFT mainstem bronchus. Other: Tracheostomy scar. IMPRESSION: Mild proximal tracheal wall irregularity without stenosis. Layering secretions in hypopharynx presents aspiration risk.  Asymmetrically smaller LEFT lower lobe mainstem bronchus. Markedly enlarged main pulmonary artery versus mediastinal mass. Recommend CT chest. Electronically Signed   By: Elon Alas M.D.   On: 10/20/2016 18:33    Recent Labs  10/19/16 1644  WBC 13.6*  HGB 10.2*  HCT 31.9*  PLT 460*    Recent Labs  10/19/16 1643  NA 128*  K 4.7  CL 90*  GLUCOSE 79  BUN 39*  CREATININE 5.69*  CALCIUM 8.7*   CBG (last 3)   Recent Labs  10/20/16 1701 10/20/16 2041 10/21/16 0632  GLUCAP 78 180* 119*    Wt Readings from Last 3 Encounters:  10/20/16 56.2 kg (123 lb 14.4 oz)  10/11/16 58.1 kg (128 lb 1.4 oz)  08/18/16 71.2 kg (156 lb 15.5 oz)    Physical Exam:  BP (!) 97/57 (BP Location: Right Arm)   Pulse 88   Temp 98.9 F (37.2 C) (Oral)   Resp 16   Ht 5\' 1"  (1.549 m)   Wt 56.2 kg (123 lb 14.4 oz)   SpO2 97%   BMI 23.41 kg/m  Constitutional:  She appears well-developed and well-nourished. NAD. HENT: Normocephalic and atraumatic.  Eyes: EOM are normal. No discharge.  Cardiovascular: RRR no jvd Respiratory:no cough. Occasional rhonchi GI: Soft.  BS+.  Musculoskeletal: She exhibits mild tenderness (LUE-AVG). She exhibits no edema.  Neurological: She is alert and oriented.  Dysphonia improving Motor: 4+/5 proximal to distal bilateral UE/LE Skin: Skin is warm and dry. She is not diaphoretic. No erythema.  Incision healing Psychiatric: Her affect is pleasant. She is slowed, improving.    Assessment/Plan: 1. Functional deficits secondary to debility from multiply cardial and pulmonary issues which require 3+ hours per day of interdisciplinary therapy in a comprehensive inpatient rehab setting. Physiatrist is providing close team supervision and 24 hour management of active medical problems listed below. Physiatrist and rehab team continue to assess barriers to discharge/monitor patient progress  toward functional and medical goals.  Function:  Bathing Bathing position    Position: Wheelchair/chair at sink  Bathing parts Body parts bathed by patient: Right arm, Left arm, Chest, Abdomen, Right upper leg, Left upper leg, Front perineal area, Right lower leg, Left lower leg, Buttocks Body parts bathed by helper: Back  Bathing assist Assist Level: Touching or steadying assistance(Pt > 75%)      Upper Body Dressing/Undressing Upper body dressing   What is the patient wearing?: Pull over shirt/dress Bra - Perfomed by patient: Thread/unthread right bra strap, Thread/unthread left bra strap Bra - Perfomed by helper: Hook/unhook bra (pull down sports bra) Pull over shirt/dress - Perfomed by patient: Thread/unthread left sleeve, Thread/unthread right sleeve, Put head through opening, Pull shirt over trunk Pull over shirt/dress - Perfomed by helper: Pull shirt over trunk        Upper body assist Assist Level: Supervision or verbal cues      Lower Body Dressing/Undressing Lower body dressing   What is the patient wearing?: Pants, Non-skid slipper socks     Pants- Performed by patient: Pull pants up/down, Thread/unthread right pants leg, Thread/unthread left pants leg Pants- Performed by helper: Thread/unthread left pants leg Non-skid slipper socks- Performed by patient: Don/doff right sock, Don/doff left sock Non-skid slipper socks- Performed by helper: Don/doff right sock, Don/doff left sock (due to time constraints)                  Lower body assist Assist for lower body dressing: Touching or steadying assistance (Pt > 75%) (Mod assist)      Toileting Toileting Toileting activity did not occur: No continent bowel/bladder event   Toileting steps completed by helper: Performs perineal hygiene, Adjust clothing prior to toileting, Adjust clothing after toileting    Toileting assist Assist level: Touching or steadying assistance (Pt.75%)   Transfers Chair/bed transfer Chair/bed transfer activity did not occur: Safety/medical concerns Chair/bed  transfer method: Stand pivot Chair/bed transfer assist level: Touching or steadying assistance (Pt > 75%) Chair/bed transfer assistive device: Armrests     Locomotion Ambulation     Max distance: 150 ft Assist level: Touching or steadying assistance (Pt > 75%)   Wheelchair   Type: Manual Max wheelchair distance: 150 ft Assist Level: Supervision or verbal cues  Cognition Comprehension Comprehension assist level: Follows basic conversation/direction with extra time/assistive device  Expression Expression assist level: Expresses basic 90% of the time/requires cueing < 10% of the time.  Social Interaction Social Interaction assist level: Interacts appropriately with others with medication or extra time (anti-anxiety, antidepressant).  Problem Solving Problem solving assist level: Solves basic 50 - 74% of the time/requires cueing 25 - 49% of the time  Memory Memory assist level: Recognizes or recalls 50 - 74% of the time/requires cueing 25 - 49% of the time    Medical Problem List and Plan: 1.  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  Cont CIR--no restrictions for activity 2.  DVT Prophylaxis/Anticoagulation: Pharmaceutical: Coumadin per pharmacy 3. Pain Management: Hydrocodone prn effective. 4. Mood: LCSW to follow for evaluation and support.  5. Neuropsych: This patient is not fully capable of making decisions on her own behalf. 6. Skin/Wound Care: Routine pressure relief measures.  7. Fluids/Electrolytes/Nutrition: Strict I/O. Renal panel TTSa with HD.   Diet changed to D2 to help with fatigue  Weaned reglan 11/13, again on 11/14, increased back to 5mg  TID on 11/14 after regurgitation, continue for now 8.  Leucocytosis:   Cont cipro for bronchitis   WBCs 13.6 on 11/16 (stable), appreciate CVT S follow-up Labs with HD 10 T2DM:Hgb A1c- 13.3.   Levemir decreased to 5u bid due hypoglycemia  yesterday/inconsistent intake  -monitor today   CBG (last 3)   Recent Labs  10/20/16 1701 10/20/16 2041 10/21/16 0632  GLUCAP 78 180* 119*    11. ESRD: Now HD dependent--to schedule HD in afternoons to help with tolerance of therapy during the day. Educate patient and family on renal diet. Electrolyte abnormalities being managed during HD.  12. Anemia of chronic disease:   Hb 10.2 on 11/16   Monitor for signs of bleeding.   On aranesp for erythropoiesis and nulecit for iron load  Labs with HD 13. CAD s/p DES X 2 and MVR: Monitor for symptoms with increase in activity. On plavix and Lipitor.  14. Acute respiratory failure due to acute pulmonary edema:  Fluid overload compensated with HD.  15. AFlutter s/p DCCV/PAF: HR controlled on amiodarone. On coumadin.  16. Persistent cough with dysphonia: Off antibiotics. Repeat CXR 11/12 unremarkable for acute process  -seems to be showing some improvement  Cont nebs  Continue robinul for secretions and Robitussin DM for cough, increased on 11/14  Encourage IS with use of flutter valve 17. Sleep disturbance: managed with benadryl prn  LOS (Days) 10 A FACE TO FACE EVALUATION WAS PERFORMED  Deborah [name] T 10/21/2016 8:11 AM

## 2016-10-21 NOTE — Progress Notes (Signed)
ANTICOAGULATION CONSULT NOTE - Follow Up Consult  Pharmacy Consult for coumadin Indication: atrial fibrillation  No Known Allergies  Patient Measurements: Height: 5\' 1"  (154.9 cm) Weight: 123 lb 14.4 oz (56.2 kg) IBW/kg (Calculated) : 47.8 Heparin Dosing Weight:   Vital Signs: Temp: 98.9 F (37.2 C) (11/18 0544) Temp Source: Oral (11/18 0544) BP: 97/57 (11/18 0544) Pulse Rate: 88 (11/18 0544)  Labs:  Recent Labs  10/19/16 0518 10/19/16 1643 10/19/16 1644 10/20/16 0658 10/21/16 0542  HGB  --   --  10.2*  --   --   HCT  --   --  31.9*  --   --   PLT  --   --  460*  --   --   LABPROT 38.1*  --   --  24.8* 24.0*  INR 3.77  --   --  2.20 2.11  CREATININE  --  5.69*  --   --   --     Estimated Creatinine Clearance: 8 mL/min (by C-G formula based on SCr of 5.69 mg/dL (H)).   Medications:  Scheduled:  . amiodarone  200 mg Oral BID  . atorvastatin  20 mg Oral q1800  . clopidogrel  75 mg Oral Daily  . darbepoetin (ARANESP) injection - DIALYSIS  100 mcg Intravenous Q Tue-HD  . famotidine  20 mg Oral Daily  . feeding supplement  1 Container Oral TID BM  . feeding supplement (PRO-STAT SUGAR FREE 64)  30 mL Oral TID  . gabapentin  300 mg Oral QHS  . Gerhardt's butt cream   Topical BID  . guaiFENesin  600 mg Oral Q6H  . hydrocortisone   Rectal BID  . insulin aspart  0-5 Units Subcutaneous QHS  . insulin aspart  0-9 Units Subcutaneous TID WC  . insulin detemir  5 Units Subcutaneous BID  . lanthanum  1,000 mg Oral TID WC  . metoCLOPramide  5 mg Oral TID AC & HS  . midodrine  10 mg Oral q morning - 10a  . multivitamin  1 tablet Oral QHS  . pantoprazole  20 mg Oral BID  . sodium chloride flush  10-40 mL Intracatheter Q12H  . Warfarin - Pharmacist Dosing Inpatient   Does not apply q1800   Infusions:    Assessment: 60 yo female with afib is currently on therapeutic coumadin.  INR is now down to 3.77>2.2>2.11 after one dose of coumadin 1 mg last night. No CBC since 11/16  when Hgb ~10, pLTC 460. No s/sx bleeding noted. Good PO intake, on amiodarone.   Goal of Therapy:  INR 2 - 2.5 Monitor platelets by anticoagulation protocol: Yes   Plan:  - coumadin 1 mg po x1 - INR in am -monitor for s/sx bleeding, CBC, PO intake, DDI, clinical picture.  Carlean Jews, Pharm.D. PGY1 Pharmacy Resident 11/18/201712:06 PM Pager [phone]

## 2016-10-22 ENCOUNTER — Inpatient Hospital Stay (HOSPITAL_COMMUNITY): Payer: Self-pay | Admitting: Occupational Therapy

## 2016-10-22 ENCOUNTER — Inpatient Hospital Stay (HOSPITAL_COMMUNITY): Payer: Self-pay | Admitting: Speech Pathology

## 2016-10-22 LAB — CBC
HCT: 31.8 % — ABNORMAL LOW (ref 36.0–46.0)
HEMOGLOBIN: 9.9 g/dL — AB (ref 12.0–15.0)
MCH: 27.3 pg (ref 26.0–34.0)
MCHC: 31.1 g/dL (ref 30.0–36.0)
MCV: 87.8 fL (ref 78.0–100.0)
PLATELETS: 384 10*3/uL (ref 150–400)
RBC: 3.62 MIL/uL — ABNORMAL LOW (ref 3.87–5.11)
RDW: 16.2 % — ABNORMAL HIGH (ref 11.5–15.5)
WBC: 10.4 10*3/uL (ref 4.0–10.5)

## 2016-10-22 LAB — GLUCOSE, CAPILLARY
GLUCOSE-CAPILLARY: 122 mg/dL — AB (ref 65–99)
GLUCOSE-CAPILLARY: 127 mg/dL — AB (ref 65–99)
Glucose-Capillary: 194 mg/dL — ABNORMAL HIGH (ref 65–99)
Glucose-Capillary: 104 mg/dL — ABNORMAL HIGH (ref 65–99)

## 2016-10-22 LAB — PROTIME-INR
INR: 1.8
PROTHROMBIN TIME: 21.1 s — AB (ref 11.4–15.2)

## 2016-10-22 MED ORDER — DARBEPOETIN ALFA 150 MCG/0.3ML IJ SOSY
150.0000 ug | PREFILLED_SYRINGE | INTRAMUSCULAR | Status: DC
  Filled 2016-10-22: qty 0.3

## 2016-10-22 MED ORDER — WARFARIN SODIUM 1 MG PO TABS
1.0000 mg | ORAL_TABLET | Freq: Once | ORAL | Status: AC
  Administered 2016-10-22: 1 mg via ORAL
  Filled 2016-10-22: qty 0.5
  Filled 2016-10-22: qty 1

## 2016-10-22 NOTE — Progress Notes (Signed)
[place] PHYSICAL MEDICINE & REHABILITATION     PROGRESS NOTE  Subjective/Complaints:  No problems this AM thus far. Able to sleep. Occasional cough  ROS: Pt denies fever, rash/itching, headache, blurred or double vision, nausea, vomiting, abdominal pain, diarrhea, chest pain, shortness of breath, palpitations, dysuria, dizziness, neck pain, back pain, bleeding, anxiety, or depression    Objective: Vital Signs: Blood pressure (!) 102/51, pulse 95, temperature 98.6 F (37 C), temperature source Oral, resp. rate 16, height 5\' 1"  (1.549 m), weight 56.9 kg (125 lb 7.1 oz), SpO2 95 %. Ct Soft Tissue Neck Wo Contrast  Result Date: 10/20/2016 CLINICAL DATA:  None. FINDINGS: Pharynx and larynx: Layering secretions in the hypopharynx extending to the bilateral piriform sinus. Normal larynx. Mild proximal tracheal wall irregularity without stenosis. Salivary glands: Normal. Thyroid: Normal. Lymph nodes: No lymphadenopathy by CT size criteria though sensitivity decreased without contrast. Vascular: Retropharyngeal course of the carotid arteries. Tunneled dialysis catheter via LEFT internal jugular venous approach. Limited intracranial: Nonacute. Mild calcific atherosclerosis of the carotid siphons. Visualized orbits: Normal. Mastoids and visualized paranasal sinuses: Mild bilateral maxillary sinus mucosal thickening without air-fluid levels. Mastoid air cells are well aerated. Skeleton: Broad reversed cervical lordosis with moderate to severe C5-6 and C6-7 degenerative discs. Status post median sternotomy. Tooth 8 and LEFT mandible molar periapical lucency/abscess. Upper chest: Lung apices are clear. Markedly enlarged partially imaged main pulmonary artery versus mediastinal mass. Asymmetrically smaller LEFT mainstem bronchus. Other: Tracheostomy scar. IMPRESSION: Mild proximal tracheal wall irregularity without stenosis. Layering secretions in hypopharynx presents aspiration risk. Asymmetrically smaller  LEFT lower lobe mainstem bronchus. Markedly enlarged main pulmonary artery versus mediastinal mass. Recommend CT chest. Electronically Signed   By: Elon Alas M.D.   On: 10/20/2016 18:33   Ct Chest Wo Contrast  Result Date: 10/21/2016 CLINICAL DATA:  60 year old female with possible mediastinal mass identified on neck CT. EXAM: CT CHEST WITHOUT CONTRAST TECHNIQUE: Multidetector CT imaging of the chest was performed following the standard protocol without IV contrast. COMPARISON:  10/20/2016 neck CT. FINDINGS: Cardiovascular: The possible mediastinal mass identified on neck CT represents a mildly enlarged main pulmonary artery with a diameter of 3.7 cm. Cardiomegaly, mitral valve replacement and left IJ central venous catheter with tip at the superior cavoatrial junction noted. No pericardial effusion. Mediastinum/Nodes: No mass, enlarged lymph nodes identified. Lungs/Pleura: Moderate bilateral lower lobe atelectasis identified. There is no evidence of pleural effusion, pneumothorax or mass. Upper Abdomen: No acute abnormality. Musculoskeletal: Median sternotomy changes noted. No acute bony abnormalities present. IMPRESSION: Mildly enlarged main pulmonary artery (3.7 cm) accounting for possible mediastinal mass identified on recent neck CT. Moderate bilateral lower lobe atelectasis. Cardiomegaly and mitral valve replacement. Electronically Signed   By: Margarette Canada M.D.   On: 10/21/2016 17:34    Recent Labs  10/21/16 1807 10/22/16 0403  WBC 11.9* 10.4  HGB 9.8* 9.9*  HCT 31.0* 31.8*  PLT 455* 384    Recent Labs  10/19/16 1643 10/21/16 1807  NA 128* 128*  K 4.7 4.1  CL 90* 92*  GLUCOSE 79 84  BUN 39* 27*  CREATININE 5.69* 5.38*  CALCIUM 8.7* 8.5*   CBG (last 3)   Recent Labs  10/21/16 2206 10/21/16 2310 10/22/16 0640  GLUCAP 68 85 122*    Wt Readings from Last 3 Encounters:  10/22/16 56.9 kg (125 lb 7.1 oz)  10/11/16 58.1 kg (128 lb 1.4 oz)  08/18/16 71.2 kg (156 lb  15.5 oz)    Physical Exam:  BP Marland Kitchen)  102/51 (BP Location: Right Arm)   Pulse 95   Temp 98.6 F (37 C) (Oral)   Resp 16   Ht 5\' 1"  (1.549 m)   Wt 56.9 kg (125 lb 7.1 oz)   SpO2 95%   BMI 23.70 kg/m  Constitutional:  She appears well-developed and well-nourished. NAD. HENT: NCAT Eyes: EOM are normal. No discharge.  Cardiovascular: RRR Respiratory:intermittent cough. Occasional rhonchi/ no distress GI: Soft, NT.  BS+.  Musculoskeletal: She exhibits mild tenderness (LUE-AVG). She exhibits no edema.  Neurological: She is alert and oriented.  Dysphonia present Motor: 4+/5 proximal to distal bilateral UE/LE Skin: Skin is warm and dry. She is not diaphoretic. No erythema.  Incision healing nicely Psychiatric: Her affect is flat    Assessment/Plan: 1. Functional deficits secondary to debility from multiply cardial and pulmonary issues which require 3+ hours per day of interdisciplinary therapy in a comprehensive inpatient rehab setting. Physiatrist is providing close team supervision and 24 hour management of active medical problems listed below. Physiatrist and rehab team continue to assess barriers to discharge/monitor patient progress toward functional and medical goals.  Function:  Bathing Bathing position   Position: Wheelchair/chair at sink  Bathing parts Body parts bathed by patient: Right arm, Left arm, Chest Body parts bathed by helper: Back, Left lower leg, Right lower leg  Bathing assist Assist Level:  (moderate assist)      Upper Body Dressing/Undressing Upper body dressing   What is the patient wearing?: Bra, Pull over shirt/dress Bra - Perfomed by patient: Thread/unthread right bra strap, Thread/unthread left bra strap Bra - Perfomed by helper: Hook/unhook bra (pull down sports bra) Pull over shirt/dress - Perfomed by patient: Put head through opening Pull over shirt/dress - Perfomed by helper: Pull shirt over trunk        Upper body assist Assist Level:  Touching or steadying assistance(Pt > 75%)      Lower Body Dressing/Undressing Lower body dressing   What is the patient wearing?: Pants, Non-skid slipper socks     Pants- Performed by patient: Pull pants up/down, Fasten/unfasten pants Pants- Performed by helper: Thread/unthread right pants leg, Thread/unthread left pants leg Non-skid slipper socks- Performed by patient: Don/doff right sock, Don/doff left sock Non-skid slipper socks- Performed by helper: Don/doff right sock, Don/doff left sock                  Lower body assist Assist for lower body dressing:  (Moderate assist)      Toileting Toileting Toileting activity did not occur: No continent bowel/bladder event   Toileting steps completed by helper: Performs perineal hygiene, Adjust clothing prior to toileting, Adjust clothing after toileting    Toileting assist Assist level: Touching or steadying assistance (Pt.75%)   Transfers Chair/bed transfer Chair/bed transfer activity did not occur: Safety/medical concerns Chair/bed transfer method: Stand pivot Chair/bed transfer assist level: Touching or steadying assistance (Pt > 75%) Chair/bed transfer assistive device: Armrests     Locomotion Ambulation     Max distance: 110 ft Assist level: Touching or steadying assistance (Pt > 75%)   Wheelchair   Type: Manual Max wheelchair distance: 150 ft Assist Level: Supervision or verbal cues  Cognition Comprehension Comprehension assist level: Follows basic conversation/direction with no assist  Expression Expression assist level: Expresses basic 90% of the time/requires cueing < 10% of the time.  Social Interaction Social Interaction assist level: Interacts appropriately with others with medication or extra time (anti-anxiety, antidepressant).  Problem Solving Problem solving assist level: Solves basic 50 -  74% of the time/requires cueing 25 - 49% of the time  Memory Memory assist level: Recognizes or recalls 50 - 74% of  the time/requires cueing 25 - 49% of the time    Medical Problem List and Plan: 1.  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  Cont CIR--up as tolerated 2.  DVT Prophylaxis/Anticoagulation: Pharmaceutical: Coumadin per pharmacy 3. Pain Management: Hydrocodone prn effective. 4. Mood: LCSW to follow for evaluation and support.  5. Neuropsych: This patient is not fully capable of making decisions on her own behalf. 6. Skin/Wound Care: Routine pressure relief measures.  7. Fluids/Electrolytes/Nutrition: Strict I/O. Renal panel TTSa with HD.   Diet changed to D2 to help with fatigue  Weaned reglan 11/13, again on 11/14, increased back to 5mg  TID on 11/14 after regurgitation, continue for now 8. Leucocytosis:   Cont cipro for bronchitis   WBCs 13.6 on 11/16 (stable), appreciate CVT S follow-up Labs with HD 10 T2DM:Hgb A1c- 13.3.   Levemir decreased to 5u bid due hypoglycemia yesterday/inconsistent intake  -hold levemir for now given persistent hypoglycemia   CBG (last 3)   Recent Labs  10/21/16 2206 10/21/16 2310 10/22/16 0640  GLUCAP 68 85 122*    11. ESRD: Now HD dependent--to schedule HD in afternoons to help with tolerance of therapy during the day. Educate patient and family on renal diet. Electrolyte abnormalities being managed during HD.  12. Anemia of chronic disease:   Hb 10.2 on 11/16   Monitor for signs of bleeding.   On aranesp for erythropoiesis and nulecit for iron load  Labs with HD 13. CAD s/p DES X 2 and MVR: Monitor for symptoms with increase in activity. On plavix and Lipitor.  14. Acute respiratory failure due to acute pulmonary edema:  Fluid overload compensated with HD.  15. AFlutter s/p DCCV/PAF: HR controlled on amiodarone. On coumadin.  16. Persistent cough with dysphonia: Off antibiotics. Repeat CXR 11/12 unremarkable for acute process  - some improvement  Cont  nebs  Continue robinul for secretions and Robitussin DM for cough, increased on 11/14  Encourage IS with use of flutter valve  -CT of chest shows enlarged PA which likely appeared as potential mass on neck CT. Surgery following along 17. Sleep disturbance: managed with benadryl prn  LOS (Days) 11 A FACE TO FACE EVALUATION WAS PERFORMED  Juanangel Soderholm T 10/22/2016 7:59 AM

## 2016-10-22 NOTE — Progress Notes (Signed)
Speech Language Pathology Daily Session Note  Patient Details  Name: Deborah [name] MRN: [id number] Date of Birth: 15-Sep-1956  Today's Date: 10/22/2016 SLP Individual Time: 1115-1200 SLP Individual Time Calculation (min): 45 min   Short Term Goals: Week 2: SLP Short Term Goal 1 (Week 2): Pt will recall new information with supervision verbal cues for use of external aids.  SLP Short Term Goal 2 (Week 2): Pt will observe swallow precautions with PO intake at mod I level.  SLP Short Term Goal 3 (Week 2): Pt will tolerate trials of Dys 3 and regular with oral management WFL at min A.  SLP Short Term Goal 4 (Week 2): Pt will demonstrate functional problem solving with min A. SLP Short Term Goal 5 (Week 2): Pt will return demonstration of vocal hygiene techniques with min assist verbal cues.    Skilled Therapeutic Interventions: Skilled treatment session focused on cognition goals. SLP facilitated the session by providing Min A verbal cues for functional problem solving during medication management tasks. Pt with increased difficulty with verbal communication this session d/t continuous coughing. Pt was returned to room by family members. Continue current plan of care.   Function:    Cognition Comprehension Comprehension assist level: Follows basic conversation/direction with no assist  Expression   Expression assist level: Expresses complex 90% of the time/cues < 10% of the time  Social Interaction Social Interaction assist level: Interacts appropriately with others with medication or extra time (anti-anxiety, antidepressant).  Problem Solving Problem solving assist level: Solves basic 50 - 74% of the time/requires cueing 25 - 49% of the time  Memory Memory assist level: Recognizes or recalls 50 - 74% of the time/requires cueing 25 - 49% of the time    Pain    Therapy/Group: Individual Therapy  Yassir Enis 10/22/2016, 1:32 PM

## 2016-10-22 NOTE — Progress Notes (Signed)
ANTICOAGULATION CONSULT NOTE - Follow Up Consult  Pharmacy Consult for coumadin Indication: atrial fibrillation  No Known Allergies  Patient Measurements: Height: 5\' 1"  (154.9 cm) Weight: 125 lb 7.1 oz (56.9 kg) IBW/kg (Calculated) : 47.8 Heparin Dosing Weight:   Vital Signs: Temp: 98.6 F (37 C) (11/19 0538) Temp Source: Oral (11/19 0538) BP: 102/51 (11/19 0538) Pulse Rate: 95 (11/19 0538)  Labs:  Recent Labs  10/19/16 1643  10/19/16 1644 10/20/16 0658 10/21/16 0542 10/21/16 1807 10/22/16 0403  HGB  --   < > 10.2*  --   --  9.8* 9.9*  HCT  --   --  31.9*  --   --  31.0* 31.8*  PLT  --   --  460*  --   --  455* 384  LABPROT  --   --   --  24.8* 24.0*  --  21.1*  INR  --   --   --  2.20 2.11  --  1.80  CREATININE 5.69*  --   --   --   --  5.38*  --   < > = values in this interval not displayed.  Estimated Creatinine Clearance: 8.5 mL/min (by C-G formula based on SCr of 5.38 mg/dL (H)).   Medications:  Scheduled:  . amiodarone  200 mg Oral BID  . atorvastatin  20 mg Oral q1800  . clopidogrel  75 mg Oral Daily  . darbepoetin (ARANESP) injection - DIALYSIS  100 mcg Intravenous Q Tue-HD  . famotidine  20 mg Oral Daily  . feeding supplement  1 Container Oral TID BM  . feeding supplement (PRO-STAT SUGAR FREE 64)  30 mL Oral TID  . gabapentin  300 mg Oral QHS  . Gerhardt's butt cream   Topical BID  . guaiFENesin  600 mg Oral Q6H  . hydrocortisone   Rectal BID  . insulin aspart  0-5 Units Subcutaneous QHS  . insulin aspart  0-9 Units Subcutaneous TID WC  . lanthanum  1,000 mg Oral TID WC  . metoCLOPramide  5 mg Oral TID AC & HS  . midodrine  10 mg Oral q morning - 10a  . multivitamin  1 tablet Oral QHS  . pantoprazole  20 mg Oral BID  . sodium chloride flush  10-40 mL Intracatheter Q12H  . Warfarin - Pharmacist Dosing Inpatient   Does not apply q1800   Infusions:    Assessment: 60 yo female with afib, now SUBtherapeutic on warfarin.  INR is now down to  3.77>2.2>2.11> 1.8 after two doses of coumadin 1 mg last night. CBC stable, No s/sx bleeding noted. Steady PO intake, on amiodarone.  Patient seems to be very sensitive looking at overall trends on warfarin. Will continue to be cautious. No CV procedures planned from my review of notes.   Goal of Therapy:  INR 2 - 2.5 Monitor platelets by anticoagulation protocol: Yes   Plan:  - coumadin 1 mg po x1 - INR in am -monitor for s/sx bleeding, CBC, PO intake, DDI, clinical picture.  Carlean Jews, Pharm.D. PGY1 Pharmacy Resident 11/19/201710:49 AM Pager [phone]

## 2016-10-22 NOTE — Progress Notes (Addendum)
Patient ID: Deborah [name], female   DOB: [birth year], 60 y.o.   MRN: [id number]  [facility] Progress Note   Subjective:    Pt resting with family at bedside    Objective:   BP 109/63 (BP Location: Right Arm)   Pulse 92   Temp 98.2 F (36.8 C) (Oral)   Resp 20   Ht 5\' 1"  (1.549 m)   Wt 56.9 kg (125 lb 7.1 oz)   SpO2 97%   BMI 23.70 kg/m   Physical Exam:  VS as noted NAD, sleeping Prev trach site closed, intermittent coughing, less than before Normal WOB S1S2 No S3 Healed sternotomy scar Abd soft and not tender No edema of LE's Left upper arm AVG + bruit (10/30) Left sided TDC (9/28 VVS)   Recent Labs Lab 10/16/16 0607 10/17/16 0455 10/17/16 0603 10/18/16 0630 10/19/16 1643 10/21/16 1807  NA 131* 132* 133* 131* 128* 128*  K 4.3 4.3 4.2 4.7 4.7 4.1  CL 94* 93* 95* 91* 90* 92*  CO2 23 24 25 26 24 24   GLUCOSE 167* 84 79 116* 79 84  BUN 21* 37* 38* 12 39* 27*  CREATININE 4.01* 5.38* 5.79* 3.16* 5.69* 5.38*  CALCIUM 8.6* 8.5* 8.5* 8.7* 8.7* 8.5*  PHOS 5.4* 6.2*  --  5.0* 4.8* 3.3   Iron/TIBC/Ferritin/ %Sat    Component Value Date/Time   IRON 22 (L) 10/10/2016 0356   TIBC 326 10/10/2016 0356   FERRITIN 272 10/10/2016 0356   IRONPCTSAT 7 (L) 10/10/2016 0356   Results for SHELA, ESSES (MRN [id number]) as of 10/12/2016 08:46  Ref. Range 10/10/2016 03:56  PTH Latest Ref Range: 15 - 65 pg/mL 199 (H)    Recent Labs Lab 10/17/16 0603 10/19/16 1644 10/21/16 1807 10/22/16 0403  WBC 15.2* 13.6* 11.9* 10.4  NEUTROABS 12.1*  --   --   --   HGB 10.5* 10.2* 9.8* 9.9*  HCT 33.3* 31.9* 31.0* 31.8*  MCV 86.3 85.8 87.3 87.8  PLT 498* 460* 455* 384    Lab Results  Component Value Date   INR 1.80 10/22/2016   INR 2.11 10/21/2016   INR 2.20 10/20/2016    Medications:    . amiodarone  200 mg Oral BID  . atorvastatin  20 mg Oral q1800  . clopidogrel  75 mg Oral Daily  . darbepoetin (ARANESP) injection - DIALYSIS  100 mcg Intravenous Q  Tue-HD  . famotidine  20 mg Oral Daily  . feeding supplement  1 Container Oral TID BM  . feeding supplement (PRO-STAT SUGAR FREE 64)  30 mL Oral TID  . gabapentin  300 mg Oral QHS  . Gerhardt's butt cream   Topical BID  . guaiFENesin  600 mg Oral Q6H  . hydrocortisone   Rectal BID  . insulin aspart  0-5 Units Subcutaneous QHS  . insulin aspart  0-9 Units Subcutaneous TID WC  . lanthanum  1,000 mg Oral TID WC  . metoCLOPramide  5 mg Oral TID AC & HS  . midodrine  10 mg Oral q morning - 10a  . multivitamin  1 tablet Oral QHS  . pantoprazole  20 mg Oral BID  . sodium chloride flush  10-40 mL Intracatheter Q12H  . warfarin  1 mg Oral ONCE-1800  . Warfarin - Pharmacist Dosing Inpatient   Does not apply q1800   Background: 60 y/o AAF with DM2, HTN, HLD presented to [facility] ER with infposterior STEMI on 9/12->cath->. PCS with stent x 2 by  Dr. Clayborn Bigness. EF 55-60% with severe MR.  Post-cath respiratory failure ->intubation. Shock and renal failure (creatinine 1.6 on adm). Trialysis catheter placed 9/14 and pt had CRRT at [facility] for 48 hours prior to transfer here on 08/18/16->back to cath lab -> ruptured posterior papillary muscle with severe MR->IABP/[place]. We were asked to see d/t progressive rise in creatinine/volume overload/anuric AKI .  CRRT re-initiated 08/21/16-9/20, then 9/21 post MVR. Transitioned to IHD.  Dialysis dependent since 08/17/16. No recovery. New ESRD.  Assessment/ Plan:    1. 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 Amio. Coumadin.  Using midodrine for BP.  2. 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  TTS schedule via Left TDC (9/28 [facility]). S/P LUA AVG placed on 10/30 (brachioaxillary) by Dr. Donzetta Matters - will be ready by Nov 30. EDW looks to be about 57 kg. (outpt has spot [facility] TTS)  Please note that due to Thanksgiving her next HD is 11/20 3. Anemia: 2/2  ESRD/Fe def - on darbe 100 Qtuesday--> increase to 150 mg,  to be given Monday 11/20. TSat 7%. S/p Fe load with HD. Last Hb 9.9 4. CKD-MBD: Phos high. Started lanthanum 1g AC. Phos has been labile, likely related to PO intake. PTH 199 no intervention needed yet. 2Ca bath with HD.  5. Nutrition: Low albumin secondary to critical illness/prolonged hospitalization, (Nepro/continue renal vitamin) 6. Cough/intermittent dyspnea: Last CXR 11/5 clear, no edema.  Has some inspiratory upper airway sounds.  CT neck showing large PA vs mediastinal mass.  CT surg following.  Have ordered CT chest without contrast which showed that pt has a large Pa, no mediastinal mass 7. Disposition - Cont rehab. Eventual home, HD at [place] [place] Cell [phone] Pager 10/22/2016, 4:21 PM

## 2016-10-22 NOTE — Progress Notes (Signed)
Occupational Therapy Session Note  Patient Details  Name: Deborah [name] MRN: [id number] Date of Birth: [birth year]  Today's Date: 10/22/2016 OT Individual Time: 7341-9379 OT Individual Time Calculation (min): 60 min     Short Term Goals: Week 1:  OT Short Term Goal 1 (Week 1): Pt will tolerate 5 minutes standing at the sink to complete ADL tasks  with close supervision.  OT Short Term Goal 1 - Progress (Week 1): Progressing toward goal OT Short Term Goal 2 (Week 1): Pt will complete LB dressing with Min A OT Short Term Goal 2 - Progress (Week 1): Progressing toward goal OT Short Term Goal 3 (Week 1): Pt will complete toilet transfer with overall Min A OT Short Term Goal 3 - Progress (Week 1): Progressing toward goal OT Short Term Goal 4 (Week 1): Pt will follow 75% commands with min questioning cues. OT Short Term Goal 4 - Progress (Week 1): Met Week 2:  OT Short Term Goal 1 (Week 2): Pt will tolerate 5 minutes standing at the sink to complete ADL tasks without seated rest break.  OT Short Term Goal 2 (Week 2): Pt will complete LB dressing with Min A OT Short Term Goal 3 (Week 2): Pt will maintain dynamic standing balance during ADL task with overall Min A OT Short Term Goal 4 (Week 2): Pt will complete toileting with Mod A OT Short Term Goal 5 (Week 2): Pt will complete toilet transfer with overall Min A and LRAD Week 3:     Skilled Therapeutic Interventions/Progress Updates:   Pt was lying in bed with nursing and daughter present at time of arrival. Pt reported having a difficult night sleeping but was agreeable to get up with therapy. Pt ambulated short distance to closet with Min A, RW and daughter providing w/c follow to retrieve clothing items. Pt then completed ADLs w/c level at sink with setup and overall Min A with extra time. Mod cues for sequencing and max cues for safety when transitioning from sit<stand as well as for washing/dressing LEs. One major LOB when standing at sink  with therapist correction. Pt reported that her legs "gave out." Pts daughter [name] educated on safe ADL setup at home with emphasis on having chairs directly behind pt when completing tasks with standing demands. [name] verbalized understanding. HR/02 sats assessed due to pt SOB at times, with 02 sats <97% on room air and HR normal. Pt completed oral care at sink with instruction on adaptive technique due to impaired [facility]. At end of session pt was left in w/c with daughter present.   Therapy Documentation Precautions:  Precautions Precautions: Fall Restrictions Weight Bearing Restrictions: No   Pain: No c/o pain during session    ADL: ADL ADL Comments: Please see functional navigator     See Function Navigator for Current Functional Status.   Therapy/Group: Individual Therapy  [name] A Hedi Barkan 10/22/2016, 12:29 PM

## 2016-10-23 ENCOUNTER — Inpatient Hospital Stay (HOSPITAL_COMMUNITY): Payer: Medicaid Other | Admitting: Physical Therapy

## 2016-10-23 ENCOUNTER — Inpatient Hospital Stay (HOSPITAL_COMMUNITY): Payer: Medicaid Other | Admitting: Occupational Therapy

## 2016-10-23 ENCOUNTER — Inpatient Hospital Stay (HOSPITAL_COMMUNITY): Payer: Medicaid Other | Admitting: Speech Pathology

## 2016-10-23 ENCOUNTER — Inpatient Hospital Stay (HOSPITAL_COMMUNITY): Payer: Self-pay | Admitting: Occupational Therapy

## 2016-10-23 DIAGNOSIS — R791 Abnormal coagulation profile: Secondary | ICD-10-CM | POA: Insufficient documentation

## 2016-10-23 LAB — GLUCOSE, CAPILLARY
GLUCOSE-CAPILLARY: 108 mg/dL — AB (ref 65–99)
GLUCOSE-CAPILLARY: 39 mg/dL — AB (ref 65–99)
GLUCOSE-CAPILLARY: 88 mg/dL (ref 65–99)
Glucose-Capillary: 261 mg/dL — ABNORMAL HIGH (ref 65–99)
Glucose-Capillary: 128 mg/dL — ABNORMAL HIGH (ref 65–99)
Glucose-Capillary: 101 mg/dL — ABNORMAL HIGH (ref 65–99)

## 2016-10-23 LAB — PROTIME-INR
INR: 1.79
Prothrombin Time: 21 seconds — ABNORMAL HIGH (ref 11.4–15.2)

## 2016-10-23 MED ORDER — INSULIN ASPART 100 UNIT/ML ~~LOC~~ SOLN
2.0000 [IU] | Freq: Three times a day (TID) | SUBCUTANEOUS | Status: DC
  Administered 2016-10-25: 2 [IU] via SUBCUTANEOUS
  Administered 2016-10-27 – 2016-10-28 (×3): 3 [IU] via SUBCUTANEOUS
  Administered 2016-10-29: 4 [IU] via SUBCUTANEOUS
  Administered 2016-10-30 – 2016-10-31 (×4): 3 [IU] via SUBCUTANEOUS
  Administered 2016-11-01: 2 [IU] via SUBCUTANEOUS
  Administered 2016-11-01: 3 [IU] via SUBCUTANEOUS

## 2016-10-23 MED ORDER — FLUTICASONE PROPIONATE 50 MCG/ACT NA SUSP
2.0000 | Freq: Every day | NASAL | Status: DC
  Administered 2016-10-26 – 2016-11-01 (×7): 2 via NASAL
  Filled 2016-10-23: qty 16

## 2016-10-23 MED ORDER — WARFARIN SODIUM 2 MG PO TABS
2.0000 mg | ORAL_TABLET | Freq: Once | ORAL | Status: AC
  Administered 2016-10-23: 2 mg via ORAL
  Filled 2016-10-23: qty 1

## 2016-10-23 MED ORDER — METOCLOPRAMIDE HCL 5 MG PO TABS
5.0000 mg | ORAL_TABLET | Freq: Two times a day (BID) | ORAL | Status: DC
  Administered 2016-10-23 – 2016-11-01 (×18): 5 mg via ORAL
  Filled 2016-10-23 (×18): qty 1

## 2016-10-23 MED ORDER — GLUCOSE 40 % PO GEL
ORAL | Status: AC
  Administered 2016-10-23: 17:00:00
  Filled 2016-10-23: qty 1

## 2016-10-23 NOTE — Plan of Care (Signed)
Problem: RH SKIN INTEGRITY Goal: RH STG SKIN FREE OF INFECTION/BREAKDOWN Outcome: Not Progressing Heels boggy, refusing prevalon boots and won't keep heels elevated.

## 2016-10-23 NOTE — Progress Notes (Signed)
[place] PHYSICAL MEDICINE & REHABILITATION     PROGRESS NOTE  Subjective/Complaints:  Pt laying in bed this AM.  Per report pt refusing interventions this AM, however, on exam, pt pleasant, stating she is doing better and her cough is stable.  She states she slept better and has questions about her CT.  ROS: +Cough.  Denies CP, SOB, NV/D.    Objective: Vital Signs: Blood pressure (!) 87/44, pulse 86, temperature 98.1 F (36.7 C), temperature source Oral, resp. rate 16, height 5\' 1"  (1.549 m), weight 57 kg (125 lb 10.6 oz), SpO2 98 %. Ct Chest Wo Contrast  Result Date: 10/21/2016 CLINICAL DATA:  60 year old female with possible mediastinal mass identified on neck CT. EXAM: CT CHEST WITHOUT CONTRAST TECHNIQUE: Multidetector CT imaging of the chest was performed following the standard protocol without IV contrast. COMPARISON:  10/20/2016 neck CT. FINDINGS: Cardiovascular: The possible mediastinal mass identified on neck CT represents a mildly enlarged main pulmonary artery with a diameter of 3.7 cm. Cardiomegaly, mitral valve replacement and left IJ central venous catheter with tip at the superior cavoatrial junction noted. No pericardial effusion. Mediastinum/Nodes: No mass, enlarged lymph nodes identified. Lungs/Pleura: Moderate bilateral lower lobe atelectasis identified. There is no evidence of pleural effusion, pneumothorax or mass. Upper Abdomen: No acute abnormality. Musculoskeletal: Median sternotomy changes noted. No acute bony abnormalities present. IMPRESSION: Mildly enlarged main pulmonary artery (3.7 cm) accounting for possible mediastinal mass identified on recent neck CT. Moderate bilateral lower lobe atelectasis. Cardiomegaly and mitral valve replacement. Electronically Signed   By: Margarette Canada M.D.   On: 10/21/2016 17:34    Recent Labs  10/21/16 1807 10/22/16 0403  WBC 11.9* 10.4  HGB 9.8* 9.9*  HCT 31.0* 31.8*  PLT 455* 384    Recent Labs  10/21/16 1807  NA 128*  K  4.1  CL 92*  GLUCOSE 84  BUN 27*  CREATININE 5.38*  CALCIUM 8.5*   CBG (last 3)   Recent Labs  10/22/16 1655 10/22/16 2121 10/23/16 0647  GLUCAP 127* 104* 108*    Wt Readings from Last 3 Encounters:  10/23/16 57 kg (125 lb 10.6 oz)  10/11/16 58.1 kg (128 lb 1.4 oz)  08/18/16 71.2 kg (156 lb 15.5 oz)    Physical Exam:  BP (!) 87/44 (BP Location: Right Arm)   Pulse 86   Temp 98.1 F (36.7 C) (Oral)   Resp 16   Ht 5\' 1"  (1.549 m)   Wt 57 kg (125 lb 10.6 oz)   SpO2 98%   BMI 23.74 kg/m  Constitutional:  She appears well-developed and well-nourished. NAD. HENT: [place], AT Eyes: EOM are normal. No discharge.  Cardiovascular: RRR. No JVD. Respiratory: Unlabored. Clear to auscultation.  GI: Soft, NT.  BS+.  Musculoskeletal: She exhibits mild tenderness (LUE-AVG). She exhibits no edema.  Neurological: She is alert and oriented.  Dysphonia stable Motor: 4+/5 proximal to distal bilateral UE/LE (stable) Skin: Skin is warm and dry. She is not diaphoretic. No erythema.  Incision healing  Psychiatric: Her affect is flat. Normal behavior.   Assessment/Plan: 1. Functional deficits secondary to debility from multiply cardial and pulmonary issues which require 3+ hours per day of interdisciplinary therapy in a comprehensive inpatient rehab setting. Physiatrist is providing close team supervision and 24 hour management of active medical problems listed below. Physiatrist and rehab team continue to assess barriers to discharge/monitor patient progress toward functional and medical goals.  Function:  Bathing Bathing position   Position: Wheelchair/chair at sink  Bathing parts Body parts bathed by patient: Right arm, Left arm, Chest, Abdomen, Front perineal area, Buttocks, Right upper leg, Left upper leg, Right lower leg, Left lower leg Body parts bathed by helper: Back  Bathing assist Assist Level: Touching or steadying assistance(Pt > 75%)      Upper Body  Dressing/Undressing Upper body dressing   What is the patient wearing?: Pull over shirt/dress Bra - Perfomed by patient: Thread/unthread right bra strap, Thread/unthread left bra strap Bra - Perfomed by helper: Hook/unhook bra (pull down sports bra) Pull over shirt/dress - Perfomed by patient: Thread/unthread right sleeve, Thread/unthread left sleeve, Put head through opening, Pull shirt over trunk Pull over shirt/dress - Perfomed by helper: Pull shirt over trunk        Upper body assist Assist Level: Touching or steadying assistance(Pt > 75%)      Lower Body Dressing/Undressing Lower body dressing   What is the patient wearing?: Pants, Non-skid slipper socks, Underwear Underwear - Performed by patient: Thread/unthread right underwear leg, Thread/unthread left underwear leg Underwear - Performed by helper: Pull underwear up/down Pants- Performed by patient: Thread/unthread right pants leg, Thread/unthread left pants leg Pants- Performed by helper: Pull pants up/down Non-skid slipper socks- Performed by patient: Don/doff left sock Non-skid slipper socks- Performed by helper: Don/doff left sock                  Lower body assist Assist for lower body dressing:  (Min-Mod A)      Toileting Toileting Toileting activity did not occur: No continent bowel/bladder event   Toileting steps completed by helper: Performs perineal hygiene, Adjust clothing prior to toileting, Adjust clothing after toileting    Toileting assist Assist level: Touching or steadying assistance (Pt.75%)   Transfers Chair/bed transfer Chair/bed transfer activity did not occur: Safety/medical concerns Chair/bed transfer method: Stand pivot Chair/bed transfer assist level: Touching or steadying assistance (Pt > 75%) Chair/bed transfer assistive device: Armrests, Medical sales representative     Max distance: 110 ft Assist level: Touching or steadying assistance (Pt > 75%)   Wheelchair   Type:  Manual Max wheelchair distance: 150 ft Assist Level: Supervision or verbal cues  Cognition Comprehension Comprehension assist level: Follows basic conversation/direction with no assist  Expression Expression assist level: Expresses basic 90% of the time/requires cueing < 10% of the time.  Social Interaction Social Interaction assist level: Interacts appropriately 75 - 89% of the time - Needs redirection for appropriate language or to initiate interaction.  Problem Solving Problem solving assist level: Solves basic 50 - 74% of the time/requires cueing 25 - 49% of the time  Memory Memory assist level: Recognizes or recalls 50 - 74% of the time/requires cueing 25 - 49% of the time    Medical Problem List and Plan: 1.  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  Cont CIR--up as tolerated 2.  DVT Prophylaxis/Anticoagulation: Pharmaceutical: Coumadin per pharmacy  INR subtherapeutic 11/20 3. Pain Management: Hydrocodone prn effective. 4. Mood: LCSW to follow for evaluation and support.  5. Neuropsych: This patient is not fully capable of making decisions on her own behalf. 6. Skin/Wound Care: Routine pressure relief measures.  7. Fluids/Electrolytes/Nutrition: Strict I/O. Renal panel TTSa with HD.   Diet changed to D2 to help with fatigue  Weaned reglan 11/13, again on 11/14, increased back to 5mg  TID on 11/14 after regurgitation, weaned again 11/20 8. Leucocytosis:   Cont cipro for bronchitis  WBCs 10.4 on 11/19, appreciate CTS follow-up Labs with HD 10 T2DM:Hgb A1c- 13.3.   Levemir on hold due to hypoglycemia   Stable at present  CBG (last 3)   Recent Labs  10/22/16 1655 10/22/16 2121 10/23/16 0647  GLUCAP 127* 104* 108*    11. ESRD: Now HD dependent--to schedule HD in afternoons to help with tolerance of therapy during the day. Educate patient and family on renal diet. Electrolyte abnormalities being  managed during HD.  12. Anemia of chronic disease:   Hb 9.9 on 11/20  Monitor for signs of bleeding.   On aranesp for erythropoiesis and nulecit for iron load  Labs with HD 13. CAD s/p DES X 2 and MVR: Monitor for symptoms with increase in activity. On plavix and Lipitor.  14. Acute respiratory failure due to acute pulmonary edema:  Fluid overload compensated with HD.  15. AFlutter s/p DCCV/PAF: HR controlled on amiodarone. On coumadin.  16. Persistent cough with dysphonia: Off antibiotics. Repeat CXR 11/12 unremarkable for acute process  - some improvement  Cont nebs  Continue robinul for secretions and Robitussin DM for cough, increased on 11/14  Encourage IS with use of flutter valve  -CT of chest shows enlarged PA which likely appeared as potential mass on neck CT. Surgery following along 17. Sleep disturbance: managed with benadryl prn  LOS (Days) 12 A FACE TO FACE EVALUATION WAS PERFORMED  Ankit Lorie Phenix 10/23/2016 9:06 AM

## 2016-10-23 NOTE — Progress Notes (Signed)
Occupational Therapy Session Note  Patient Details  Name: Deborah [name] MRN: [id number] Date of Birth: September 04, 1956  Today's Date: 10/23/2016 OT Individual Time: 0700-0720 OT Individual Time Calculation (min): 20 min  and Today's Date: 10/23/2016 OT Missed Time: 40 Minutes Missed Time Reason: Patient unwilling/refused to participate without medical reason     Short Term Goals: Week 2:  OT Short Term Goal 1 (Week 2): Pt will tolerate 5 minutes standing at the sink to complete ADL tasks without seated rest break.  OT Short Term Goal 2 (Week 2): Pt will complete LB dressing with Min A OT Short Term Goal 3 (Week 2): Pt will maintain dynamic standing balance during ADL task with overall Min A OT Short Term Goal 4 (Week 2): Pt will complete toileting with Mod A OT Short Term Goal 5 (Week 2): Pt will complete toilet transfer with overall Min A and LRAD  Skilled Therapeutic Interventions/Progress Updates:     Upon entering the room, pt supine in bed sleeping. Pt declining therapy this morning. OT educated pt on today's therapy schedule, expectation, importance of participation, and OT goals. Pt continued to decline OT intervention this session. Bed alarm activated. Call bell and all needed items within reach upon exiting the room.   Therapy Documentation Precautions:  Precautions Precautions: Fall Restrictions Weight Bearing Restrictions: No General: General OT Amount of Missed Time: 40 Minutes Vital Signs: Therapy Vitals Temp: 98.1 F (36.7 C) Temp Source: Oral Pulse Rate: 86 Resp: 16 BP: (!) 87/44 Patient Position (if appropriate): Lying Oxygen Therapy SpO2: 98 % O2 Device: Not Delivered Pain:   ADL: ADL ADL Comments: Please see functional navigator  See Function Navigator for Current Functional Status.   Therapy/Group: Individual Therapy  Gypsy Decant 10/23/2016, 7:28 AM

## 2016-10-23 NOTE — Progress Notes (Signed)
Assessment/ Plan:    1. 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 Amio. Coumadin.  Using midodrine for BP.  2. ESRD, new start: Dialysis dependent since 08/17/16.  TTS schedule via Left TDC (9/28 [facility]). S/P LUA AVG placed on 10/30 (brachioaxillary) by Dr. Donzetta Matters - will be ready by Nov 30. EDW looks to be about 57 kg. (outpt has spot [facility] TTS)  Due to Thanksgiving her next HD is 11/20 3. Disposition - Cont rehab. Eventual home, HD at [facility]  Subjective: Interval History: Getting PT  Objective: Vital signs in last 24 hours: Temp:  [98.1 F (36.7 C)-98.2 F (36.8 C)] 98.2 F (36.8 C) (11/20 1359) Pulse Rate:  [86-95] 95 (11/20 1359) Resp:  [16-20] 20 (11/20 1359) BP: (87-133)/(44-67) 133/67 (11/20 1359) SpO2:  [98 %] 98 % (11/20 1359) Weight:  [57 kg (125 lb 10.6 oz)] 57 kg (125 lb 10.6 oz) (11/20 0500) Weight change: 0 kg (0 lb)  Intake/Output from previous day: 11/19 0701 - 11/20 0700 In: 360 [P.O.:360] Out: -  Intake/Output this shift: Total I/O In: 160 [P.O.:160] Out: -   General appearance: alert, cooperative and weak voice Resp: clear to auscultation bilaterally Cardio: regular rate and rhythm, S1, S2 normal, no murmur, click, rub or gallop Extremities: LUE AV thrill  Lab Results:  Recent Labs  10/21/16 1807 10/22/16 0403  WBC 11.9* 10.4  HGB 9.8* 9.9*  HCT 31.0* 31.8*  PLT 455* 384   BMET:  Recent Labs  10/21/16 1807  NA 128*  K 4.1  CL 92*  CO2 24  GLUCOSE 84  BUN 27*  CREATININE 5.38*  CALCIUM 8.5*   No results for input(s): PTH in the last 72 hours. Iron Studies: No results for input(s): IRON, TIBC, TRANSFERRIN, FERRITIN in the last 72 hours. Studies/Results: Ct Chest Wo Contrast  Result Date: 10/21/2016 CLINICAL DATA:  59 year old female with possible mediastinal mass identified on neck CT. EXAM: CT CHEST WITHOUT CONTRAST TECHNIQUE: Multidetector CT imaging  of the chest was performed following the standard protocol without IV contrast. COMPARISON:  10/20/2016 neck CT. FINDINGS: Cardiovascular: The possible mediastinal mass identified on neck CT represents a mildly enlarged main pulmonary artery with a diameter of 3.7 cm. Cardiomegaly, mitral valve replacement and left IJ central venous catheter with tip at the superior cavoatrial junction noted. No pericardial effusion. Mediastinum/Nodes: No mass, enlarged lymph nodes identified. Lungs/Pleura: Moderate bilateral lower lobe atelectasis identified. There is no evidence of pleural effusion, pneumothorax or mass. Upper Abdomen: No acute abnormality. Musculoskeletal: Median sternotomy changes noted. No acute bony abnormalities present. IMPRESSION: Mildly enlarged main pulmonary artery (3.7 cm) accounting for possible mediastinal mass identified on recent neck CT. Moderate bilateral lower lobe atelectasis. Cardiomegaly and mitral valve replacement. Electronically Signed   By: Margarette Canada M.D.   On: 10/21/2016 17:34    Scheduled: . amiodarone  200 mg Oral BID  . atorvastatin  20 mg Oral q1800  . clopidogrel  75 mg Oral Daily  . [START ON 10/25/2016] darbepoetin (ARANESP) injection - DIALYSIS  150 mcg Intravenous Q Wed-HD  . famotidine  20 mg Oral Daily  . feeding supplement  1 Container Oral TID BM  . feeding supplement (PRO-STAT SUGAR FREE 64)  30 mL Oral TID  . fluticasone  2 spray Each Nare Daily  . gabapentin  300 mg Oral QHS  . Gerhardt's butt cream   Topical BID  . guaiFENesin  600 mg Oral Q6H  .  hydrocortisone   Rectal BID  . insulin aspart  0-5 Units Subcutaneous QHS  . insulin aspart  0-9 Units Subcutaneous TID WC  . lanthanum  1,000 mg Oral TID WC  . metoCLOPramide  5 mg Oral BID WC  . midodrine  10 mg Oral q morning - 10a  . multivitamin  1 tablet Oral QHS  . pantoprazole  20 mg Oral BID  . sodium chloride flush  10-40 mL Intracatheter Q12H  . warfarin  2 mg Oral ONCE-1800  . Warfarin -  Pharmacist Dosing Inpatient   Does not apply q1800    LOS: 12 days   Winton Offord C 10/23/2016,3:49 PM

## 2016-10-23 NOTE — Progress Notes (Signed)
Physical Therapy Session Note  Patient Details  Name: Deborah [name] MRN: [id number] Date of Birth: 04/05/1956  Today's Date: 10/23/2016 PT Individual Time: 1305-1400 PT Individual Time Calculation (min): 55 min    Short Term Goals: Week 2:  PT Short Term Goal 1 (Week 2): Patient will perform transfers with consistent min A.  PT Short Term Goal 2 (Week 2): Patient will ambulate 75 ft with min A consistently.  PT Short Term Goal 3 (Week 2): Patient will maintain dynamic standing balance x 1 min with min A.  PT Short Term Goal 4 (Week 2): Patient will negotiate up/down 4 stairs using L rail only with min A.   Skilled Therapeutic Interventions/Progress Updates:    Pt received in bed & agreeable to tx, denying c/o pain. Pt drinking water & required cuing to sit up right in bed and cuing to tuck chin after each sip. Pt appeared to have wheezing & difficulty coughing at beginning of session; provided instructions for large cough, SpO2 = 98%, HR = 98 bpm on room air. Pt ambulated 100 ft +  40 ft + 10 ft with RW & increasing forward trunk flexion, great reliance on BUE and close supervision increasing to min/mod assist 2/2 loss of balance. Provided max cuing for pt to ambulate within base of RW & pt requires cuing for safety with transfers. Pt completed car transfer at small SUV simulated height with supervision; pt reports her daughter has a Education officer, community and an Theatre manager truck she can ride home in, but she has to step up onto running board to transfer in/out of truck. Educated & provided demonstration for stair negotiation with single rail. Pt required max assist to negotiate single step laterally (trialed both L & R) with max cuing for placement of foot on each step and max cuing for technique. Pt appeared to have great difficulty following one step commands during task & presented with BLE knee buckling during task. Pt propelled w/c x 150 ft back to room with BUE & supervision for cardiovascular endurance  training. At end of session pt left in bed with all needs within reach & alarm set. Reviewed use of acapella flutter device & pt reported comfort with using device.   Therapy Documentation Precautions:  Precautions Precautions: Fall Restrictions Weight Bearing Restrictions: No    See Function Navigator for Current Functional Status.   Therapy/Group: Individual Therapy  [name] 10/23/2016, 2:34 PM

## 2016-10-23 NOTE — Plan of Care (Signed)
Problem: RH BOWEL ELIMINATION Goal: RH STG MANAGE BOWEL WITH ASSISTANCE STG Manage Bowel with min Assistance.  Outcome: Not Progressing Incontinent of stool, total assist with hygiene and linen change.

## 2016-10-23 NOTE — Progress Notes (Signed)
Poor appetite. Intermittent coughing fits. PRN xanax given at 2100 per patient's request. 2124 PRN albuterol neb treatment given. Thick peeling skin to bilateral feet, lotion applied. Bilateral heels boggy, refuses prevalon boots. Attempt to Elevate heels, but patient removes pillows. LBM 11/19-large incontinent BM.  Left AVG (+) bruit and thrill. Deborah [name] A

## 2016-10-23 NOTE — Progress Notes (Addendum)
Speech Language Pathology Daily Session Note  Patient Details  Name: Deborah [name] MRN: [id number] Date of Birth: 1956/09/15  Today's Date: 10/23/2016 SLP Individual Time: 7493-5521 SLP Individual Time Calculation (min): 30 min   Short Term Goals: Week 2: SLP Short Term Goal 1 (Week 2): Pt will recall new information with supervision verbal cues for use of external aids.  SLP Short Term Goal 2 (Week 2): Pt will observe swallow precautions with PO intake at mod I level.  SLP Short Term Goal 3 (Week 2): Pt will tolerate trials of Dys 3 and regular with oral management WFL at min A.  SLP Short Term Goal 4 (Week 2): Pt will demonstrate functional problem solving with min A. SLP Short Term Goal 5 (Week 2): Pt will return demonstration of vocal hygiene techniques with min assist verbal cues.    Skilled Therapeutic Interventions:   Skilled treatment session focused on addressing dysphagia and cognition goals. SLP facilitated session by providing Min assist question cues for use of external aids to assist with orientation to date.  Patient also able to recall 2/3 therapy tasks, verified via chart review.  Unable to locate external aid for vocal hygiene and as a result, patient required Max assist question cues to recall need for hydration to improve vocal quality.  Given that patient recently woke up ice chips were provided to facilitate this with Mod I use of chin tuck and cough x1.  Continue with current plan of care.    Function:  Cognition Comprehension Comprehension assist level: Follows basic conversation/direction with no assist  Expression   Expression assist level: Expresses basic needs/ideas: With extra time/assistive device  Social Interaction Social Interaction assist level: Interacts appropriately 75 - 89% of the time - Needs redirection for appropriate language or to initiate interaction.  Problem Solving Problem solving assist level: Solves basic 50 - 74% of the time/requires  cueing 25 - 49% of the time  Memory Memory assist level: Recognizes or recalls 50 - 74% of the time/requires cueing 25 - 49% of the time    Pain Pain Assessment Pain Assessment: No/denies pain  Therapy/Group: Individual Therapy  Carmelia Roller., CCC-SLP [phone]  [place] 10/23/2016, 4:53 PM

## 2016-10-23 NOTE — Progress Notes (Signed)
Speech Language Pathology Daily Session Note  Patient Details  Name: Deborah [name] MRN: [id number] Date of Birth: Sep 15, 1956  Today's Date: 10/23/2016 SLP Individual Time: 8144-8185 SLP Individual Time Calculation (min): 56 min   Short Term Goals:Week 2: SLP Short Term Goal 1 (Week 2): Pt will recall new information with supervision verbal cues for use of external aids.  SLP Short Term Goal 2 (Week 2): Pt will observe swallow precautions with PO intake at mod I level.  SLP Short Term Goal 3 (Week 2): Pt will tolerate trials of Dys 3 and regular with oral management WFL at min A.  SLP Short Term Goal 4 (Week 2): Pt will demonstrate functional problem solving with min A. SLP Short Term Goal 5 (Week 2): Pt will return demonstration of vocal hygiene techniques with min assist verbal cues.    Skilled Therapeutic Interventions:  Pt was seen for skilled ST targeting goals for cognition and dysphagia.  Pt consumed her lunch meal of dys 2 textures and thin liquids with overall mod I use of swallowing precautions.  Pt continues to have weak, congested cough which did not appear to worsen in severity or frequency with PO intake.  Recommend that pt remain on currently prescribed diet.  Therapist also facilitated the session with a novel card game targeting functional problem solving and recall of new information.  Pt needed mod assist verbal cues for working memory of task rules and procedures, as well as to plan and execute a problem solving strategy during the abovementioned task.  Pt was returned to room and left in recliner with call bell within reach.  Continue per current plan of care.       Function:  Eating Eating   Modified Consistency Diet: Yes Eating Assist Level: More than reasonable amount of time           Cognition Comprehension Comprehension assist level: Follows basic conversation/direction with no assist  Expression   Expression assist level: Expresses basic needs/ideas:  With extra time/assistive device  Social Interaction Social Interaction assist level: Interacts appropriately 75 - 89% of the time - Needs redirection for appropriate language or to initiate interaction.  Problem Solving Problem solving assist level: Solves basic 50 - 74% of the time/requires cueing 25 - 49% of the time  Memory Memory assist level: Recognizes or recalls 50 - 74% of the time/requires cueing 25 - 49% of the time    Pain Pain Assessment Pain Assessment: No/denies pain  Therapy/Group: Individual Therapy  Tetsuo Coppola, Selinda Orion 10/23/2016, 11:55 AM

## 2016-10-23 NOTE — Progress Notes (Signed)
Occupational Therapy Session Note  Patient Details  Name: Deborah [name] MRN: [id number] Date of Birth: [birth year]  Today's Date: 10/23/2016 OT Individual Time: 0930-1030 OT Individual Time Calculation (min): 60 min     Short Term Goals: Week 2:  OT Short Term Goal 1 (Week 2): Pt will tolerate 5 minutes standing at the sink to complete ADL tasks without seated rest break.  OT Short Term Goal 2 (Week 2): Pt will complete LB dressing with Min A OT Short Term Goal 3 (Week 2): Pt will maintain dynamic standing balance during ADL task with overall Min A OT Short Term Goal 4 (Week 2): Pt will complete toileting with Mod A OT Short Term Goal 5 (Week 2): Pt will complete toilet transfer with overall Min A and LRAD  Skilled Therapeutic Interventions/Progress Updates:    Treatment session with focus on activity tolerance, standing balance, and functional mobility.  Pt received in bed declining bathing and dressing but willing to participate in treatment session.  Pt completed bed mobility with supervision and ambulated 90 feet with RW with min guard.  Required prolonged seated rest break and then ambulated additional 75 feet with RW.  Pt with 1 LOB when turning into gym, getting legs crossed, requiring mod-max assist to regain balance.  Engaged in standing activity with pt standing with hips in hyperextension due to decreased activity tolerance and standing tolerance.  Pt able to tolerate standing 2-3 mins x4 to complete table to task.  Required mod cues for sequencing to complete pipe tree puzzle.  Returned to room via w/c due to fatigue and left seated in recliner.  Therapy Documentation Precautions: Precautions Precautions: Fall Restrictions Weight Bearing Restrictions: No General:   Vital Signs: Therapy Vitals Pulse Rate: 92 BP: (!) 99/56 Patient Position (if appropriate): Lying Pain: Pain Assessment Pain Assessment: No/denies pain  See Function Navigator for Current Functional  Status.   Therapy/Group: Individual Therapy  Simonne Come 10/23/2016, 12:23 PM

## 2016-10-23 NOTE — Progress Notes (Signed)
ANTICOAGULATION CONSULT NOTE - Follow Up Consult  Pharmacy Consult for coumadin Indication: atrial fibrillation  No Known Allergies  Patient Measurements: Height: 5\' 1"  (154.9 cm) Weight: 125 lb 10.6 oz (57 kg) IBW/kg (Calculated) : 47.8 Heparin Dosing Weight:   Vital Signs: Temp: 98.1 F (36.7 C) (11/20 0500) Temp Source: Oral (11/20 0500) BP: 87/44 (11/20 0500) Pulse Rate: 86 (11/20 0500)  Labs:  Recent Labs  10/21/16 0542 10/21/16 1807 10/22/16 0403 10/23/16 0526  HGB  --  9.8* 9.9*  --   HCT  --  31.0* 31.8*  --   PLT  --  455* 384  --   LABPROT 24.0*  --  21.1* 21.0*  INR 2.11  --  1.80 1.79  CREATININE  --  5.38*  --   --     Estimated Creatinine Clearance: 8.5 mL/min (by C-G formula based on SCr of 5.38 mg/dL (H)).   Medications:  Scheduled:  . amiodarone  200 mg Oral BID  . atorvastatin  20 mg Oral q1800  . clopidogrel  75 mg Oral Daily  . [START ON 10/25/2016] darbepoetin (ARANESP) injection - DIALYSIS  150 mcg Intravenous Q Wed-HD  . famotidine  20 mg Oral Daily  . feeding supplement  1 Container Oral TID BM  . feeding supplement (PRO-STAT SUGAR FREE 64)  30 mL Oral TID  . gabapentin  300 mg Oral QHS  . Gerhardt's butt cream   Topical BID  . guaiFENesin  600 mg Oral Q6H  . hydrocortisone   Rectal BID  . insulin aspart  0-5 Units Subcutaneous QHS  . insulin aspart  0-9 Units Subcutaneous TID WC  . lanthanum  1,000 mg Oral TID WC  . metoCLOPramide  5 mg Oral BID WC  . midodrine  10 mg Oral q morning - 10a  . multivitamin  1 tablet Oral QHS  . pantoprazole  20 mg Oral BID  . sodium chloride flush  10-40 mL Intracatheter Q12H  . Warfarin - Pharmacist Dosing Inpatient   Does not apply q1800   Infusions:    Assessment: 60 yo female with afib, now SUBtherapeutic on warfarin.  INR is now down to 1.79. Previously supratherapeutic INR likely d/t drug-drug interaction with cipro(11/1-11/5).  PTA dose: 2.5 mg daily  Goal of Therapy:  INR 2 -  2.5 Monitor platelets by anticoagulation protocol: Yes   Plan:  - coumadin 2 mg po x1 - INR in am - monitor for s/sx bleeding, CBC, PO intake, DDI, clinical picture.  Maryanna Shape, PharmD, BCPS  Clinical Pharmacist  Pager: [phone]

## 2016-10-23 NOTE — Progress Notes (Signed)
Hypoglycemic Event  CBG: 39  Treatment: 1 tube instant glucose  Symptoms: Shaky  Follow-up CBG: Time 1730 CBG Result:88  Possible Reasons for Event: Unknown  Comments/MD notified: P. Love, PA    Milus Mallick

## 2016-10-24 ENCOUNTER — Inpatient Hospital Stay (HOSPITAL_COMMUNITY): Payer: Medicaid Other | Admitting: Physical Therapy

## 2016-10-24 ENCOUNTER — Inpatient Hospital Stay (HOSPITAL_COMMUNITY): Payer: Self-pay | Admitting: Occupational Therapy

## 2016-10-24 ENCOUNTER — Inpatient Hospital Stay (HOSPITAL_COMMUNITY): Payer: Medicaid Other | Admitting: Speech Pathology

## 2016-10-24 DIAGNOSIS — D72829 Elevated white blood cell count, unspecified: Secondary | ICD-10-CM

## 2016-10-24 LAB — RENAL FUNCTION PANEL
ALBUMIN: 2.4 g/dL — AB (ref 3.5–5.0)
Anion gap: 12 (ref 5–15)
BUN: 26 mg/dL — ABNORMAL HIGH (ref 6–20)
CALCIUM: 8.5 mg/dL — AB (ref 8.9–10.3)
CO2: 24 mmol/L (ref 22–32)
CREATININE: 5.78 mg/dL — AB (ref 0.44–1.00)
Chloride: 91 mmol/L — ABNORMAL LOW (ref 101–111)
GFR calc Af Amer: 8 mL/min — ABNORMAL LOW (ref >60)
GFR calc non Af Amer: 7 mL/min — ABNORMAL LOW (ref >60)
GLUCOSE: 154 mg/dL — AB (ref 65–99)
PHOSPHORUS: 3.3 mg/dL (ref 2.5–4.6)
Potassium: 4.1 mmol/L (ref 3.5–5.1)
SODIUM: 127 mmol/L — AB (ref 135–145)

## 2016-10-24 LAB — CBC
HCT: 31.8 % — ABNORMAL LOW (ref 36.0–46.0)
Hemoglobin: 9.9 g/dL — ABNORMAL LOW (ref 12.0–15.0)
MCH: 27.1 pg (ref 26.0–34.0)
MCHC: 31.1 g/dL (ref 30.0–36.0)
MCV: 87.1 fL (ref 78.0–100.0)
PLATELETS: 402 10*3/uL — AB (ref 150–400)
RBC: 3.65 MIL/uL — AB (ref 3.87–5.11)
RDW: 16.8 % — ABNORMAL HIGH (ref 11.5–15.5)
WBC: 11.3 10*3/uL — ABNORMAL HIGH (ref 4.0–10.5)

## 2016-10-24 LAB — PROTIME-INR
INR: 1.63
PROTHROMBIN TIME: 19.5 s — AB (ref 11.4–15.2)

## 2016-10-24 LAB — GLUCOSE, CAPILLARY
GLUCOSE-CAPILLARY: 108 mg/dL — AB (ref 65–99)
GLUCOSE-CAPILLARY: 129 mg/dL — AB (ref 65–99)
GLUCOSE-CAPILLARY: 176 mg/dL — AB (ref 65–99)
Glucose-Capillary: 205 mg/dL — ABNORMAL HIGH (ref 65–99)

## 2016-10-24 MED ORDER — MIDODRINE HCL 5 MG PO TABS
ORAL_TABLET | ORAL | Status: AC
  Filled 2016-10-24: qty 2

## 2016-10-24 MED ORDER — WARFARIN SODIUM 2 MG PO TABS
2.0000 mg | ORAL_TABLET | Freq: Once | ORAL | Status: AC
  Administered 2016-10-24: 2 mg via ORAL
  Filled 2016-10-24: qty 1

## 2016-10-24 MED ORDER — AMIODARONE HCL 200 MG PO TABS
200.0000 mg | ORAL_TABLET | Freq: Every day | ORAL | Status: DC
  Administered 2016-10-25 – 2016-11-01 (×8): 200 mg via ORAL
  Filled 2016-10-24 (×9): qty 1

## 2016-10-24 NOTE — Procedures (Signed)
Tol HD . BP ok.  Small goal today. Cath working OK. k 4.1, Hgb 9.9gm Jeree Delcid C

## 2016-10-24 NOTE — Plan of Care (Signed)
Problem: RH BOWEL ELIMINATION Goal: RH STG MANAGE BOWEL WITH ASSISTANCE STG Manage Bowel with min Assistance.  Outcome: Not Progressing Pt incontinent of bowel

## 2016-10-24 NOTE — Progress Notes (Signed)
Received page from [place] concerning pts ongoing cough.  Noted TCTS continues to follow.    Cough very unlikely of cardiac origin.  OK to decrease amiodarone to 200 mg daily. Otherwise no cardiac needs at this time.    Discussed with Dr. Haroldine Laws.   Legrand Como [address] [place], PA-C 10/24/2016 2:40 PM

## 2016-10-24 NOTE — Progress Notes (Signed)
Occupational Therapy Session Note  Patient Details  Name: Deborah [name] MRN: [id number] Date of Birth: Dec 28, 1955  Today's Date: 10/24/2016 OT Individual Time: 1100-1145 OT Individual Time Calculation (min): 45 min  and Today's Date: 10/24/2016 OT Missed Time: 15 Minutes Missed Time Reason: Patient fatigue     Short Term Goals: Week 2:  OT Short Term Goal 1 (Week 2): Pt will tolerate 5 minutes standing at the sink to complete ADL tasks without seated rest break.  OT Short Term Goal 2 (Week 2): Pt will complete LB dressing with Min A OT Short Term Goal 3 (Week 2): Pt will maintain dynamic standing balance during ADL task with overall Min A OT Short Term Goal 4 (Week 2): Pt will complete toileting with Mod A OT Short Term Goal 5 (Week 2): Pt will complete toilet transfer with overall Min A and LRAD  Skilled Therapeutic Interventions/Progress Updates:    Treatment session with focus on activity tolerance and self-care retraining.  Pt received in bed stating that she had just returned from HD recently but was willing to engage in bathing and dressing tasks as she could tolerate.  Pt completed bathing at sit > stand level at sink with supervision and frequent rest breaks due to fatigue and increase in coughing with mobility.  Utilized long handled sponge for LB bathing to increase independence while conserving energy.  Pt reports urge to toilet, completed stand pivot transfer to toilet with min assist.  Pt incontinent of bowel prior to toileting and then continent of both bowel on bladder while on toilet.  Required increased time and then assistance for thoroughness with wiping post toileting.  Engaged in dressing with pt demonstrating difficulty with threading Rt pant leg and donning Rt sock.  Pt reports too fatigued due to early AM HD and requesting to return to bed and terminate session early.  Transferred back to bed with min assist stand pivot and left with all needs in reach.  Therapy  Documentation Precautions:  Precautions Precautions: Fall Restrictions Weight Bearing Restrictions: No General: General OT Amount of Missed Time: 15 Minutes PT Missed Treatment Reason: Unavailable (Comment) (HD) Vital Signs: Therapy Vitals Temp: 97.9 F (36.6 C) Temp Source: Oral Pulse Rate: 90 Resp: 16 BP: 116/62 Patient Position (if appropriate): Lying Oxygen Therapy SpO2: 94 % O2 Device: Not Delivered Pain: Pain Assessment Pain Assessment: No/denies pain Pain Score: 0-No pain  See Function Navigator for Current Functional Status.   Therapy/Group: Individual Therapy  Simonne Come 10/24/2016, 12:22 PM

## 2016-10-24 NOTE — Progress Notes (Signed)
Speech Language Pathology Daily Session Note  Patient Details  Name: Deborah [name] MRN: [id number] Date of Birth: 05/01/1956  Today's Date: 10/24/2016 SLP Individual Time: 5726-2035 SLP Individual Time Calculation (min): 28 min   Short Term Goals: Week 2: SLP Short Term Goal 1 (Week 2): Pt will recall new information with supervision verbal cues for use of external aids.  SLP Short Term Goal 2 (Week 2): Pt will observe swallow precautions with PO intake at mod I level.  SLP Short Term Goal 3 (Week 2): Pt will tolerate trials of Dys 3 and regular with oral management WFL at min A.  SLP Short Term Goal 4 (Week 2): Pt will demonstrate functional problem solving with min A. SLP Short Term Goal 5 (Week 2): Pt will return demonstration of vocal hygiene techniques with min assist verbal cues.    Skilled Therapeutic Interventions:Skilled therapy intervention focused on cognitive goals. Given a novel card task to learn, patient required Min A verbal cues for memory and problem solving to teach the task back to the clinician. When asked to repeat the card task, patient's need for cues increased to Mod A verbal and visual cues for problem solving and Max A verbal cues for short term recall by the end of session, suspect due to fatigue. Patient left upright in bed with NT in room and all needs within reach. Continue current plan of care.    Function:  Cognition Comprehension Comprehension assist level: Follows basic conversation/direction with no assist  Expression   Expression assist level: Expresses basic needs/ideas: With extra time/assistive device  Social Interaction Social Interaction assist level: Interacts appropriately 90% of the time - Needs monitoring or encouragement for participation or interaction.  Problem Solving Problem solving assist level: Solves basic 50 - 74% of the time/requires cueing 25 - 49% of the time  Memory Memory assist level: Recognizes or recalls 25 - 49% of the  time/requires cueing 50 - 75% of the time    Pain Pain Assessment Pain Assessment: No/denies pain  Therapy/Group: Individual Therapy  Thornton Papas 10/24/2016, 2:46 PM

## 2016-10-24 NOTE — Progress Notes (Addendum)
ANTICOAGULATION CONSULT NOTE - Follow Up Consult  Pharmacy Consult for coumadin Indication: atrial fibrillation  No Known Allergies  Patient Measurements: Height: 5\' 1"  (154.9 cm) Weight: 123 lb 0.3 oz (55.8 kg) IBW/kg (Calculated) : 47.8 Heparin Dosing Weight:   Vital Signs: Temp: 97.9 F (36.6 C) (11/21 0948) Temp Source: Oral (11/21 0948) BP: 116/62 (11/21 0948) Pulse Rate: 90 (11/21 0948)  Labs:  Recent Labs  10/21/16 1807 10/22/16 0403 10/23/16 0526 10/24/16 0625 10/24/16 0626 10/24/16 1143  HGB 9.8* 9.9*  --  9.9*  --   --   HCT 31.0* 31.8*  --  31.8*  --   --   PLT 455* 384  --  402*  --   --   LABPROT  --  21.1* 21.0*  --   --  19.5*  INR  --  1.80 1.79  --   --  1.63  CREATININE 5.38*  --   --   --  5.78*  --     Estimated Creatinine Clearance: 7.9 mL/min (by C-G formula based on SCr of 5.78 mg/dL (H)).   Assessment: 60 yo female with afib, now SUBtherapeutic on warfarin.  INR is now down to 1.63. Previously supratherapeutic INR likely d/t drug-drug interaction with cipro(11/1-11/5).  Goal of Therapy:  INR 2 - 2.5 Monitor platelets by anticoagulation protocol: Yes   Plan:  - coumadin 2 mg po x1 - INR in am - monitor for s/sx bleeding, CBC, PO intake, DDI, clinical picture.  Maryanna Shape, PharmD, BCPS  Clinical Pharmacist  Pager: [phone]

## 2016-10-24 NOTE — Progress Notes (Signed)
Physical Therapy Session Note  Patient Details  Name: Deborah [name] MRN: [id number] Date of Birth: Apr 11, 1956  Today's Date: 10/24/2016 PT Individual Time: 1030-1056 PT Individual Time Calculation (min): 26 min    Short Term Goals: Week 2:  PT Short Term Goal 1 (Week 2): Patient will perform transfers with consistent min A.  PT Short Term Goal 2 (Week 2): Patient will ambulate 75 ft with min A consistently.  PT Short Term Goal 3 (Week 2): Patient will maintain dynamic standing balance x 1 min with min A.  PT Short Term Goal 4 (Week 2): Patient will negotiate up/down 4 stairs using L rail only with min A.   Skilled Therapeutic Interventions/Progress Updates:    Patient at HD this AM at scheduled appointment time, seen upon return to unit until next scheduled OT session. Patient in bed with RN present to administer medication, sat EOB with supervision and required verbal cues for chin tuck with swallow. Gait training using RW x 70 ft with close supervision and verbal cues for keeping body in RW and smaller stride length. Sit <> stand transfers from EOB using RW with supervision and verbal cues for safe hand placement/technique. Instructed in BLE therex for strengthening and activity tolerance: standing marching using RW 2 x 10, standing heel raises using RW x 10, seated LAQ x 10 each LE. Patient fatigued and returned to supine x 2 during session for rest breaks. Patient continues to be limited by coughing with cues for pursed lip breathing and relaxation techniques. Patient left semi reclined in bed with needs in reach and bed alarm on.   Therapy Documentation Precautions:  Precautions Precautions: Fall Restrictions Weight Bearing Restrictions: No General: PT Amount of Missed Time (min): 34 Minutes PT Missed Treatment Reason: Unavailable (Comment) (HD) Pain: Pain Assessment Pain Assessment: No/denies pain Pain Score: 0-No pain  See Function Navigator for Current Functional  Status.   Therapy/Group: Individual Therapy  Laretta Alstrom 10/24/2016, 10:59 AM

## 2016-10-24 NOTE — Progress Notes (Signed)
[facility]IT[facility]     PROGRESS NOTE  Subjective/Complaints:  Pt seen in HD this AM due to the holidays.  She states she slept well overnight.   ROS: +Cough.  Denies CP, SOB, NV/D.    Objective: Vital Signs: Blood pressure 102/60, pulse 90, temperature 98.4 F (36.9 C), temperature source Oral, resp. rate 18, height 5\' 1"  (1.549 m), weight 55.9 kg (123 lb 3.8 oz), SpO2 94 %. No results found.  Recent Labs  10/22/16 0403 10/24/16 0625  WBC 10.4 11.3*  HGB 9.9* 9.9*  HCT 31.8* 31.8*  PLT 384 402*    Recent Labs  10/21/16 1807 10/24/16 0626  NA 128* 127*  K 4.1 4.1  CL 92* 91*  GLUCOSE 84 154*  BUN 27* 26*  CREATININE 5.38* 5.78*  CALCIUM 8.5* 8.5*   CBG (last 3)   Recent Labs  10/23/16 1730 10/23/16 1806 10/23/16 2150  GLUCAP 88 128* 101*    Wt Readings from Last 3 Encounters:  10/24/16 55.9 kg (123 lb 3.8 oz)  10/11/16 58.1 kg (128 lb 1.4 oz)  08/18/16 71.2 kg (156 lb 15.5 oz)    Physical Exam:  BP 102/60   Pulse 90   Temp 98.4 F (36.9 C) (Oral)   Resp 18   Ht 5\' 1"  (1.549 m)   Wt 55.9 kg (123 lb 3.8 oz)   SpO2 94%   BMI 23.29 kg/m  Constitutional:  She appears well-developed and well-nourished. NAD. HENT: [place], AT Eyes: EOM are normal. No discharge.  Cardiovascular: RRR. No JVD. Respiratory: Unlabored. Clear to auscultation.  GI: Soft, NT.  BS+.  Musculoskeletal: She exhibits no tenderness. She exhibits no edema.  Neurological: She is alert and oriented.  Dysphonia unchanged Motor: 4+/5 proximal to distal bilateral UE/LE (unchanged) Skin: Skin is warm and dry. She is not diaphoretic. No erythema.  Incision healed  Psychiatric: Her affect is flat. Normal behavior.   Assessment/Plan: 1. Functional deficits secondary to debility from multiply cardial and pulmonary issues which require 3+ hours per day of interdisciplinary therapy in a comprehensive inpatient rehab setting. Physiatrist is providing close team  supervision and 24 hour management of active medical problems listed below. Physiatrist and rehab team continue to assess barriers to discharge/monitor patient progress toward functional and medical goals.  Function:  Bathing Bathing position   Position: Wheelchair/chair at sink  Bathing parts Body parts bathed by patient: Right arm, Left arm, Chest, Abdomen, Front perineal area, Buttocks, Right upper leg, Left upper leg, Right lower leg, Left lower leg Body parts bathed by helper: Back  Bathing assist Assist Level: Touching or steadying assistance(Pt > 75%)      Upper Body Dressing/Undressing Upper body dressing   What is the patient wearing?: Pull over shirt/dress Bra - Perfomed by patient: Thread/unthread right bra strap, Thread/unthread left bra strap Bra - Perfomed by helper: Hook/unhook bra (pull down sports bra) Pull over shirt/dress - Perfomed by patient: Thread/unthread right sleeve, Thread/unthread left sleeve, Put head through opening, Pull shirt over trunk Pull over shirt/dress - Perfomed by helper: Pull shirt over trunk        Upper body assist Assist Level: Touching or steadying assistance(Pt > 75%)      Lower Body Dressing/Undressing Lower body dressing   What is the patient wearing?: Pants, Non-skid slipper socks, Underwear Underwear - Performed by patient: Thread/unthread right underwear leg, Thread/unthread left underwear leg Underwear - Performed by helper: Pull underwear up/down Pants- Performed by patient: Thread/unthread right pants  leg, Thread/unthread left pants leg Pants- Performed by helper: Pull pants up/down Non-skid slipper socks- Performed by patient: Don/doff left sock Non-skid slipper socks- Performed by helper: Don/doff left sock                  Lower body assist Assist for lower body dressing:  (Min-Mod A)      Toileting Toileting Toileting activity did not occur: No continent bowel/bladder event   Toileting steps completed by  helper: Performs perineal hygiene, Adjust clothing prior to toileting, Adjust clothing after toileting    Toileting assist Assist level: Touching or steadying assistance (Pt.75%)   Transfers Chair/bed transfer Chair/bed transfer activity did not occur: Safety/medical concerns Chair/bed transfer method: Ambulatory Chair/bed transfer assist level: Touching or steadying assistance (Pt > 75%) Chair/bed transfer assistive device: Medical sales representative     Max distance: 100 ft Assist level: Touching or steadying assistance (Pt > 75%)   Wheelchair   Type: Manual Max wheelchair distance: 150 ft Assist Level: Supervision or verbal cues  Cognition Comprehension Comprehension assist level: Follows basic conversation/direction with no assist  Expression Expression assist level: Expresses basic needs/ideas: With extra time/assistive device  Social Interaction Social Interaction assist level: Interacts appropriately 75 - 89% of the time - Needs redirection for appropriate language or to initiate interaction.  Problem Solving Problem solving assist level: Solves basic 50 - 74% of the time/requires cueing 25 - 49% of the time  Memory Memory assist level: Recognizes or recalls 50 - 74% of the time/requires cueing 25 - 49% of the time    Medical Problem List and Plan: 1.  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  Cont CIR--up as tolerated 2.  DVT Prophylaxis/Anticoagulation: Pharmaceutical: Coumadin per pharmacy  INR pending 11/21 3. Pain Management: Hydrocodone prn effective. 4. Mood: LCSW to follow for evaluation and support.  5. Neuropsych: This patient is not fully capable of making decisions on her own behalf. 6. Skin/Wound Care: Routine pressure relief measures.  7. Fluids/Electrolytes/Nutrition: Strict I/O. Renal panel TTSa with HD.   Diet changed to D2 to help with fatigue  Weaned reglan 11/13,  again on 11/14, increased back to 5mg  TID on 11/14 after regurgitation, weaned again 11/20 8. Persistent Leucocytosis:   Completed cipro for bronchitis   WBCs 11.3 on 11/21, appreciate CTS follow-up Labs with HD 10 T2DM:Hgb A1c- 13.3.   Levemir on hold due to hypoglycemia   Stable, with 1 spike on 11/21 CBG (last 3)   Recent Labs  10/23/16 1730 10/23/16 1806 10/23/16 2150  GLUCAP 88 128* 101*    11. ESRD: Now HD dependent--to schedule HD in afternoons to help with tolerance of therapy during the day. Educate patient and family on renal diet. Electrolyte abnormalities being managed during HD.  12. Anemia of chronic disease:   Hb 9.9 on 11/21  Monitor for signs of bleeding.   On aranesp for erythropoiesis and nulecit for iron load  Labs with HD 13. CAD s/p DES X 2 and MVR: Monitor for symptoms with increase in activity. On plavix and Lipitor.  14. Acute respiratory failure due to acute pulmonary edema:  Fluid overload compensated with HD.  15. AFlutter s/p DCCV/PAF: HR controlled on amiodarone. On coumadin.  16. Persistent cough with dysphonia: Off antibiotics. Repeat CXR 11/12 unremarkable for acute process  Cont nebs  Continue robinul for secretions and Robitussin DM for cough, increased on 11/14  Encourage IS with  use of flutter valve  -CT of chest shows enlarged PA which likely appeared as potential mass on neck CT. Surgery following along  Respiratory panel ordered  17. Sleep disturbance: managed with benadryl prn  LOS (Days) 13 A FACE TO FACE EVALUATION WAS PERFORMED  Ansleigh Safer Lorie Phenix 10/24/2016 9:55 AM

## 2016-10-24 NOTE — Progress Notes (Signed)
      [address]       [place] [address]             [phone]        Subjective: Still has coughing quite frequently  Objective: Vital signs in last 24 hours: Temp:  [97.9 F (36.6 C)-98.4 F (36.9 C)] 97.9 F (36.6 C) (11/21 0948) Pulse Rate:  [86-95] 90 (11/21 0948) Resp:  [16-22] 16 (11/21 0948) BP: (92-133)/(53-79) 116/62 (11/21 0948) SpO2:  [94 %-98 %] 94 % (11/21 0948) Weight:  [123 lb 0.3 oz (55.8 kg)-123 lb 3.8 oz (55.9 kg)] 123 lb 0.3 oz (55.8 kg) (11/21 0948)     Intake/Output from previous day: 11/20 0701 - 11/21 0700 In: 460 [P.O.:460] Out: -  Intake/Output this shift: No intake/output data recorded.  General appearance: alert, cooperative and no distress Heart: regular rate and rhythm Lungs: clear to auscultation bilaterally Extremities: no edema Wound: clean and dry  Lab Results:  Recent Labs  10/22/16 0403 10/24/16 0625  WBC 10.4 11.3*  HGB 9.9* 9.9*  HCT 31.8* 31.8*  PLT 384 402*   BMET:  Recent Labs  10/21/16 1807 10/24/16 0626  NA 128* 127*  K 4.1 4.1  CL 92* 91*  CO2 24 24  GLUCOSE 84 154*  BUN 27* 26*  CREATININE 5.38* 5.78*  CALCIUM 8.5* 8.5*    PT/INR:  Recent Labs  10/24/16 1143  LABPROT 19.5*  INR 1.63   ABG    Component Value Date/Time   PHART 7.487 (H) 10/04/2016 1430   HCO3 28.4 (H) 10/04/2016 1430   TCO2 26 09/08/2016 0412   ACIDBASEDEF 4.1 (H) 09/02/2016 1850   O2SAT 95.5 10/04/2016 1430   CBG (last 3)   Recent Labs  10/23/16 2150 10/24/16 1036 10/24/16 1205  GLUCAP 101* 108* 129*    Assessment/Plan:  1. Making good progress with PT/OT/speech pathology 2. Cough-medications recently changed. Management per primary 3. WBC 11.3 today. Has been mildly elevated since admission. Was on a broad spectrum antibiotic which was discontinued when she went to rehab. No need to initiate an antibiotic with a patient that has been afebrile without signs of infection.  4. HD today,  nephrology following and treating  5. Coumadin per pharmacy. INR 1.63, remains subtherapeutic.     LOS: 13 days    Elgie Collard 10/24/2016

## 2016-10-25 ENCOUNTER — Inpatient Hospital Stay (HOSPITAL_COMMUNITY): Payer: Self-pay | Admitting: Occupational Therapy

## 2016-10-25 ENCOUNTER — Inpatient Hospital Stay (HOSPITAL_COMMUNITY): Payer: Medicaid Other | Admitting: Speech Pathology

## 2016-10-25 ENCOUNTER — Inpatient Hospital Stay (HOSPITAL_COMMUNITY): Payer: Medicaid Other | Admitting: *Deleted

## 2016-10-25 LAB — RESPIRATORY PANEL BY PCR
Adenovirus: NOT DETECTED
BORDETELLA PERTUSSIS-RVPCR: NOT DETECTED
CORONAVIRUS OC43-RVPPCR: NOT DETECTED
Chlamydophila pneumoniae: NOT DETECTED
Coronavirus 229E: NOT DETECTED
Coronavirus HKU1: NOT DETECTED
Coronavirus NL63: NOT DETECTED
INFLUENZA A-RVPPCR: NOT DETECTED
INFLUENZA B-RVPPCR: NOT DETECTED
MYCOPLASMA PNEUMONIAE-RVPPCR: NOT DETECTED
Metapneumovirus: NOT DETECTED
PARAINFLUENZA VIRUS 1-RVPPCR: NOT DETECTED
PARAINFLUENZA VIRUS 4-RVPPCR: NOT DETECTED
Parainfluenza Virus 2: NOT DETECTED
Parainfluenza Virus 3: NOT DETECTED
RESPIRATORY SYNCYTIAL VIRUS-RVPPCR: NOT DETECTED
Rhinovirus / Enterovirus: NOT DETECTED

## 2016-10-25 LAB — PROTIME-INR
INR: 1.73
PROTHROMBIN TIME: 20.5 s — AB (ref 11.4–15.2)

## 2016-10-25 LAB — GLUCOSE, CAPILLARY
GLUCOSE-CAPILLARY: 173 mg/dL — AB (ref 65–99)
GLUCOSE-CAPILLARY: 137 mg/dL — AB (ref 65–99)
Glucose-Capillary: 228 mg/dL — ABNORMAL HIGH (ref 65–99)
Glucose-Capillary: 147 mg/dL — ABNORMAL HIGH (ref 65–99)

## 2016-10-25 MED ORDER — WARFARIN SODIUM 2 MG PO TABS
2.0000 mg | ORAL_TABLET | Freq: Once | ORAL | Status: AC
  Administered 2016-10-25: 2 mg via ORAL
  Filled 2016-10-25: qty 1

## 2016-10-25 MED ORDER — ALBUTEROL SULFATE (2.5 MG/3ML) 0.083% IN NEBU
2.5000 mg | INHALATION_SOLUTION | Freq: Four times a day (QID) | RESPIRATORY_TRACT | Status: DC
  Administered 2016-10-25: 2.5 mg via RESPIRATORY_TRACT
  Filled 2016-10-25: qty 3

## 2016-10-25 MED ORDER — ALBUTEROL SULFATE (2.5 MG/3ML) 0.083% IN NEBU
2.5000 mg | INHALATION_SOLUTION | Freq: Three times a day (TID) | RESPIRATORY_TRACT | Status: DC
  Administered 2016-10-25 – 2016-10-26 (×2): 2.5 mg via RESPIRATORY_TRACT
  Filled 2016-10-25 (×3): qty 3

## 2016-10-25 MED ORDER — ALBUTEROL SULFATE (2.5 MG/3ML) 0.083% IN NEBU
2.5000 mg | INHALATION_SOLUTION | RESPIRATORY_TRACT | Status: DC | PRN
  Administered 2016-10-27 – 2016-11-01 (×6): 2.5 mg via RESPIRATORY_TRACT
  Filled 2016-10-25 (×7): qty 3

## 2016-10-25 NOTE — Progress Notes (Signed)
Occupational Therapy Weekly Progress Note  Patient Details  Name: TASHEBA HENSON MRN: [id number] Date of Birth: 1956-08-28  Beginning of progress report period: October 12 2016 End of progress report period: October 25 2016  Patient has met 5 of 5 short term goals.    Patient continues to demonstrate the following deficits: activity tolerance, dynamic standing balance, safety awareness, and bilateral UE coordination, and therefore will continue to benefit from skilled OT intervention to enhance overall performance with BADL.  Ms. Papania has made gains with balance, cognitive awareness, activity tolerance, coordination, and standing endurance at time of report, reflected by STG achievement. Skilled OT will continue focus on stated deficits to maximize independence with self care, as well as caregiver education for safe transition home at time of discharge.   Patient progressing toward long term goals..  Continue plan of care.  OT Short Term Goals Week 3:  OT Short Term Goal 1 (Week 3): STGs=LTGs due to ELOS      Therapy Documentation Precautions:  Precautions Precautions: Fall Restrictions Weight Bearing Restrictions: No General:   Vital Signs: Therapy Vitals Temp: 98.3 F (36.8 C) Temp Source: Oral Pulse Rate: 82 Resp: 17 BP: 112/63 Patient Position (if appropriate): Lying Oxygen Therapy SpO2: 97 % O2 Device: Not Delivered Pain: Pain Assessment Pain Assessment: No/denies pain ADL: ADL ADL Comments: Please see functional navigator    See Function Navigator for Current Functional Status.   Therapy/Group: Individual Therapy  Barnes Florek A Ulysee Fyock 10/25/2016, 4:25 PM

## 2016-10-25 NOTE — Progress Notes (Signed)
Speech Language Pathology Daily Session Note  Patient Details  Name: Deborah [name] MRN: [id number] Date of Birth: 1955-12-14  Today's Date: 10/25/2016 SLP Individual Time: 0722-5750 SLP Individual Time Calculation (min): 58 min   Short Term Goals: Week 3: SLP Short Term Goal 1 (Week 3): Patient will verbalize vocal hygiene techniques and utilize when given opportunities with supervision verbal cues.   SLP Short Term Goal 2 (Week 3): Patient will demonstrate functional problem solving  for basic and familiar tasks with supervision verbal cues. SLP Short Term Goal 3 (Week 3): Patient will recall new information with supervision verbal cues for use of external aids.  SLP Short Term Goal 4 (Week 3): Patient will demonstrate efficient mastication and oral clearance with Dys. 3 textures overt 2 sessions prior to upgrade with supervision verbal cues.   Skilled Therapeutic Interventions:Skilled therapy intervention focused on cognitive goals. Given a medication management task, patient required Max A verbal cues for problem solving to use a 4 time a day pillbox, with cues fading to Mod A. Patient able to identify the name and purpose of 3/11 medications, requiring Max A verbal cues for recall. Suspect patient's vision may have been a small factor in performance, as patient said she did not have her glasses and later had trouble reading her therapy schedule. After therapist re-wrote the schedule in larger letters, patient was able to independently problem-solve for the timing of her breaks in therapy. Patient left upright in wheelchair with all needs within reach. Continue current plan of care.   Function:   Cognition Comprehension Comprehension assist level: Follows basic conversation/direction with no assist  Expression   Expression assist level: Expresses basic needs/ideas: With extra time/assistive device  Social Interaction Social Interaction assist level: Interacts appropriately with others  with medication or extra time (anti-anxiety, antidepressant).  Problem Solving Problem solving assist level: Solves basic 75 - 89% of the time/requires cueing 10 - 24% of the time  Memory Memory assist level: Recognizes or recalls 25 - 49% of the time/requires cueing 50 - 75% of the time    Pain Pain Assessment Pain Assessment: No/denies pain  Therapy/Group: Individual Therapy  Thornton Papas 10/25/2016, 2:41 PM

## 2016-10-25 NOTE — Progress Notes (Addendum)
Occupational Therapy Session Note  Patient Details  Name: Deborah [name] MRN: [id number] Date of Birth: [birth year]  Today's Date: 10/25/2016 OT Individual Time: 9741-6384 OT Individual Time Calculation (min): 29 min     Short Term Goals: Week 1:  OT Short Term Goal 1 (Week 1): Pt will tolerate 5 minutes standing at the sink to complete ADL tasks  with close supervision.  OT Short Term Goal 1 - Progress (Week 1): Progressing toward goal OT Short Term Goal 2 (Week 1): Pt will complete LB dressing with Min A OT Short Term Goal 2 - Progress (Week 1): Progressing toward goal OT Short Term Goal 3 (Week 1): Pt will complete toilet transfer with overall Min A OT Short Term Goal 3 - Progress (Week 1): Progressing toward goal OT Short Term Goal 4 (Week 1): Pt will follow 75% commands with min questioning cues. OT Short Term Goal 4 - Progress (Week 1): Met Week 2:  OT Short Term Goal 1 (Week 2): Pt will tolerate 5 minutes standing at the sink to complete ADL tasks without seated rest break.  OT Short Term Goal 2 (Week 2): Pt will complete LB dressing with Min A OT Short Term Goal 3 (Week 2): Pt will maintain dynamic standing balance during ADL task with overall Min A OT Short Term Goal 4 (Week 2): Pt will complete toileting with Mod A OT Short Term Goal 5 (Week 2): Pt will complete toilet transfer with overall Min A and LRAD Week 3:     Skilled Therapeutic Interventions/Progress Updates:   Pt was lying in bed with daughters [name] and Apolonio Schneiders) present at time of arrival. Pt reported that she had been coughing all night and was very tired, but agreeable to tx. Supine<sit completed with close supervision. Dressing completed at EOB with overall steady assist due to posterior LOBs. Questioning cues provided for safety awareness during sit<stands and during carryover of adaptive techniques when donning footwear. Oral care/lotion application tasks completed standing at sink (for over 5 minutes) with  steady assist and RW. Cues provided for adaptive technique for toothpaste application. Pt still exhibits UE coordination deficits and tasks were used for remediation of stated deficits. At end of session pt was left in w/c with all needs within reach. No c/o pain, but frequent coughing with nursing made aware.   Therapy Documentation Precautions:  Precautions Precautions: Fall Restrictions Weight Bearing Restrictions: No    Pain: No c/o pain during session    ADL: ADL ADL Comments: Please see functional navigator    See Function Navigator for Current Functional Status.   Therapy/Group: Individual Therapy  Rainn Bullinger A Freeda Spivey 10/25/2016, 12:44 PM

## 2016-10-25 NOTE — Patient Care Conference (Signed)
Inpatient RehabilitationTeam Conference and Plan of Care Update Date: 10/25/2016   Time: 10:20 AM    Patient Name: Deborah [name]      Medical Record Number: [id number]  Date of Birth: 09/15/1956 Sex: Female         Room/Bed: 4M05C/4M05C-01 Payor Info: Payor: MEDICAID PENDING / Plan: MEDICAID PENDING / Product Type: *No Product type* /    Admitting Diagnosis: Debility  MVR   Admit Date/Time:  10/11/2016  4:28 PM Admission Comments: No comment available   Primary Diagnosis:  Debility Principal Problem: Debility  Patient Active Problem List   Diagnosis Date Noted  . Leukocytosis   . Subtherapeutic international normalized ratio (INR)   . Supratherapeutic INR   . Cough   . Oropharyngeal dysphagia   . Uncontrolled type 2 diabetes mellitus with complication, with long-term current use of insulin ([place])   . Physical debility 10/11/2016  . Metabolic encephalopathy 53/29/9242  . Debility   . ESRD on dialysis ([place])   . Type 2 diabetes mellitus with complication, with long-term current use of insulin ([place])   . Anemia of chronic disease   . Coronary artery disease involving native coronary artery of native heart without angina pectoris   . Acute on chronic respiratory failure with hypoxia ([place])   . Atrial flutter ([place])   . Persistent cough   . Dysphonia   . Sleep disturbance   . Diabetes mellitus type 2 in nonobese ([name])   . Stage 3 chronic kidney disease   . NSTEMI (non-ST elevated myocardial infarction) ([place])   . Tachypnea   . Hyponatremia   . Lymphocytosis   . Acute blood loss anemia   . Pressure injury of skin 09/18/2016  . Tracheostomy status ([place])   . Delirium   . Encounter for attention to tracheostomy ([place])   . History of MVR with cardiopulmonary bypass   . Systolic congestive heart failure ([place])   . HCAP (healthcare-associated pneumonia)   . PAF (paroxysmal atrial fibrillation) ([place])   . Acute systolic congestive heart failure ([place])   . AKI (acute kidney injury)  ([place])   . Abnormal LFTs   . S/P MVR (mitral valve replacement)   . Mitral regurgitation 08/23/2016  . Acute on chronic diastolic heart failure ([place]) 08/18/2016  . Acute pulmonary edema ([name])   . Respiratory failure ([place]) 08/17/2016  . Cardiogenic shock ([place]) 08/17/2016  . Acute hypoxemic respiratory failure ([place])   . STEMI (ST elevation myocardial infarction) ([place]) 08/15/2016  . Noncompliance 01/25/2015  . Bereavement 07/09/2014  . Low back pain 02/17/2014  . Cervical cancer screening 11/20/2013  . Routine general medical examination at a health care facility 10/14/2013  . Chronic kidney disease, stage 3 06/14/2012  . Hypertension 03/08/2012  . Diabetes mellitus type 2, controlled 03/08/2012  . Hyperlipidemia 03/08/2012    Expected Discharge Date: Expected Discharge Date: 11/01/16  Team Members Present: Physician leading conference: Dr. Delice Lesch Social Worker Present: Ovidio Kin, LCSW Nurse Present: Dorien Chihuahua, RN PT Present: Canary Brim, PT OT Present: Other (comment) Oneida Arenas) SLP Present: Windell Moulding, SLP PPS Coordinator present : Daiva Nakayama, RN, CRRN     Current Status/Progress Goal Weekly Team Focus  Medical   Weakness, poor activity tolerance, dysphagia, dysarthria secondary to debility from multiply cardial and pulmonary issues  Improve mobility, cough, chronic medical issues  See above   Bowel/Bladder   Pt anuric BID bladder scans. Low volumes . Incontinent of bowel. LBM 10/25/16  timed toileting  Monitor.  Swallow/Nutrition/ Hydration   Dys. 2 textures with thin liquids, Full supervision  Supervision with least restrictive diet  Tolerance of current diet, increased use of swallow strategies, possible trials of upgraded textures    ADL's   Overall steady assist   Supervision-Mod I   safety awareness, activity tolerance, standing balance, family education    Mobility   min-mod A using RW, inconsistent  supervision-mod I  activity tolerance,  standing balance, functional mobility training, generalized strengthening, energy conservation, pt/family education   Communication   Mod A for use of vocal hygeiene strategies  Min A  continued education for vocal hygiene   Safety/Cognition/ Behavioral Observations  Mod A  Supervision  recall, problem solving, attention    Pain   No complaints of pain   <2  Assess and treat pain accordingly    Skin   Chest incision is clean and dry and intact.   No new breakdown while in rehab  Continue to monitor. encourage pressure relief       *See Care Plan and progress notes for long and short-term goals.  Barriers to Discharge: Improve mobility, safety, cough, INR, dysphagia, ESRD, DM, anemia, leukocytosis    Possible Resolutions to Barriers:  Therapies, pharmacy recs for coumadin, Nephro recs, follow labs, workup for cough    Discharge Planning/Teaching Needs:  Family working on 24 hr care between family members. Son has been here and encouraged daughter to attend therapies also      Team Discussion:  Progressing toward her goals of supervision/mod/i level. Posterior loss of balance and has sequencing. MD still working up pt for cough. Reflux issues prior to admission on meds for this. Activity tolerance and energy conservation better with. Dys 2 thin liquid diet  Revisions to Treatment Plan:  DC 11/29   Continued Need for Acute Rehabilitation Level of Care: The patient requires daily medical management by a physician with specialized training in physical medicine and rehabilitation for the following conditions: Daily direction of a multidisciplinary physical rehabilitation program to ensure safe treatment while eliciting the highest outcome that is of practical value to the patient.: Yes Daily medical management of patient stability for increased activity during participation in an intensive rehabilitation regime.: Yes Daily analysis of laboratory values and/or radiology reports with any  subsequent need for medication adjustment of medical intervention for : Cardiac problems;Pulmonary problems;Other;Diabetes problems;Renal problems  Elease Hashimoto 10/25/2016, 3:23 PM

## 2016-10-25 NOTE — Progress Notes (Signed)
Nutrition Follow-up  DOCUMENTATION CODES:   Non-severe (moderate) malnutrition in context of chronic illness  INTERVENTION:  Continue Boost Breeze po TID, each supplement provides 250 kcal and 9 grams of protein.  Continue 30 ml Prostat po TID, each supplement provides 100 kcal and 15 grams of protein.  Encourage adequate PO intake.   NUTRITION DIAGNOSIS:   Malnutrition related to chronic illness as evidenced by moderate depletion of body fat, moderate depletions of muscle mass; ongoing  GOAL:   Patient will meet greater than or equal to 90% of their needs;   MONITOR:   PO intake, Supplement acceptance, Diet advancement, Labs, Weight trends, Skin, I & O's  REASON FOR ASSESSMENT:   Consult Calorie Count (poor po)  ASSESSMENT:   60 y.o. female with history of T2DM, HTN, CKD stage III with medical non-compliance who was originally admitted to [facility] 08/15/16 with NSTEMI and underwent cardiac cath with stenting.  She developed acute renal failure with metabolic acidosis and oliguria requiring CRRT. She went on to develop cardiogenic shock requiring intubation and was transferred to [facility] on 08/18/16 for treatment. She underwent mitral valve repair by Prescott Gum on 9/21.  Right IJ placed on 9/28 and HD initiated.  She underwent tracheostomy on 10/5 and was weaned to ATC and tolerating PMSV for short periods of time. Has completed treatment for enterobacter HCAP and was decannualted on 10/25. Panda removed due to concerns of aspiration. Follow up MBS done today and diet advanced to dysphagia 3, thin. Therapy ongoing and patient with weakness with balance and proprioceptive deficits, scissoring gait as well as dysphagia.  Meal completion has been varied from 25-100% with most po intake at 50-75%. Pt currently has Boost Breeze and prostat ordered with varied consumption. RD to continue with current orders to aid in caloric and protein needs. Pt encouraged to eat her foods at meals and to drink  her supplements.   Labs and medications reviewed.   Diet Order:  DIET DYS 2 Room service appropriate? Yes; Fluid consistency: Thin; Fluid restriction: 1200 mL Fluid  Skin:  Reviewed, no issues  Last BM:  11/21  Height:   Ht Readings from Last 1 Encounters:  10/11/16 5\' 1"  (1.549 m)    Weight:   Wt Readings from Last 1 Encounters:  10/24/16 123 lb 0.3 oz (55.8 kg)    Ideal Body Weight:  47.7 kg  BMI:  Body mass index is 23.24 kg/m.  Estimated Nutritional Needs:   Kcal:  1700-1900  Protein:  80-100 grams  Fluid:  1.2 L/day  EDUCATION NEEDS:   No education needs identified at this time  Corrin Parker, MS, RD, LDN Pager # [phone] After hours/ weekend pager # [phone]

## 2016-10-25 NOTE — Progress Notes (Signed)
Social Work Patient ID: Deborah [name], female   DOB: 20-Jun-1956, 60 y.o.   MRN: [id number]  Met with pt and daughter who was here to attend therapies with pt today. Both can see her progress and  Are pleased with it. Aware MD still looking into cough she has and is on medications for her reflux. Family aware she will require 24 hr supervision at discharge. Between all of them they are trying to Provide this. Will check about her OP HD and if it has been scheduled yet. Will work on discharge needs.

## 2016-10-25 NOTE — Progress Notes (Signed)
Speech Language Pathology Weekly Progress Note  Patient Details  Name: [name] MRN: [id number] Date of Birth: 1956-01-21  Beginning of progress report period: October 19, 2016 End of progress report period: October 25, 2016   Short Term Goals: Week 2: SLP Short Term Goal 1 (Week 2): Pt will recall new information with supervision verbal cues for use of external aids.  SLP Short Term Goal 1 - Progress (Week 2): Not met SLP Short Term Goal 2 (Week 2): Pt will observe swallow precautions with PO intake at mod I level.  SLP Short Term Goal 2 - Progress (Week 2): Met SLP Short Term Goal 3 (Week 2): Pt will tolerate trials of Dys 3 and regular with oral management WFL at min A.  SLP Short Term Goal 3 - Progress (Week 2): Not met SLP Short Term Goal 4 (Week 2): Pt will demonstrate functional problem solving with min A. SLP Short Term Goal 4 - Progress (Week 2): Met SLP Short Term Goal 5 (Week 2): Pt will return demonstration of vocal hygiene techniques with min assist verbal cues.   SLP Short Term Goal 5 - Progress (Week 2): Met    New Short Term Goals: Week 3: SLP Short Term Goal 1 (Week 3): Patient will verbalize vocal hygiene techniques and utilize when given opportunities with supervision verbal cues.   SLP Short Term Goal 2 (Week 3): Patient will demonstrate functional problem solving  for basic and familiar tasks with supervision verbal cues. SLP Short Term Goal 3 (Week 3): Patient will recall new information with supervision verbal cues for use of external aids.  SLP Short Term Goal 4 (Week 3): Patient will demonstrate efficient mastication and oral clearance with Dys. 3 textures overt 2 sessions prior to upgrade with supervision verbal cues.   Weekly Progress Updates: Patient has made functional gains and has met 3 of 5 STG's this reporting period due to improved swallowing and cognitive functioning. Currently, patient is consuming Dys. 2 textures with thin liquids and is Mod  I for use of swallowing compensatory strategies. Possible trials of upgraded textures will be attempted this week. Patient also requires overall Min A to complete basic and familiar tasks safely in regards to attention and problem solving and Mod-Max A for recall. Patient and family education is ongoing. Patient would benefit from continued skilled SLP intervention to maximize her cognitive and swallowing function and overall functional independence prior to discharge.     Intensity: Minumum of 1-2 x/day, 30 to 90 minutes Frequency: 3 to 5 out of 7 days Duration/Length of Stay: 11/29 Treatment/Interventions: Dysphagia/aspiration precaution training;Environmental controls;Patient/family education;Cueing hierarchy;Cognitive remediation/compensation;Internal/external aids;Speech/Language facilitation;Functional tasks;Multimodal communication approach      Javarian Jakubiak 10/25/2016, 10:58 AM

## 2016-10-25 NOTE — Progress Notes (Signed)
1. 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 Amio. Coumadin. Using midodrine for BP.  2. ESRD, new start: Dialysis dependent since 08/17/16. TTS schedule via Left TDC (9/28 [facility]). S/P LUA AVG placed on 10/30 (brachioaxillary) by Dr. Donzetta Matters - will be ready by Nov 30. EDW looks to be about 57 kg. (outpt has spot [facility] TTS) Due to Thanksgiving her next HD is 11/23 3. Disposition - Cont rehab. Eventual home, HD at [facility]  Subjective: Interval History: No complaints  Objective: Vital signs in last 24 hours: Temp:  [98.8 F (37.1 C)] 98.8 F (37.1 C) (11/22 0524) Pulse Rate:  [88] 88 (11/22 0524) Resp:  [18] 18 (11/22 0524) BP: (94)/(60) 94/60 (11/22 0524) SpO2:  [96 %] 96 % (11/22 0524) Weight change: -0.1 kg (-3.5 oz)  Intake/Output from previous day: 11/21 0701 - 11/22 0700 In: 360 [P.O.:360] Out: 0  Intake/Output this shift: Total I/O In: 240 [P.O.:240] Out: -   alert and appropriate  Lab Results:  Recent Labs  10/24/16 0625  WBC 11.3*  HGB 9.9*  HCT 31.8*  PLT 402*   BMET:  Recent Labs  10/24/16 0626  NA 127*  K 4.1  CL 91*  CO2 24  GLUCOSE 154*  BUN 26*  CREATININE 5.78*  CALCIUM 8.5*   No results for input(s): PTH in the last 72 hours. Iron Studies: No results for input(s): IRON, TIBC, TRANSFERRIN, FERRITIN in the last 72 hours. Studies/Results: No results found.  Scheduled: . albuterol  2.5 mg Nebulization TID  . amiodarone  200 mg Oral Daily  . atorvastatin  20 mg Oral q1800  . clopidogrel  75 mg Oral Daily  . darbepoetin (ARANESP) injection - DIALYSIS  150 mcg Intravenous Q Wed-HD  . famotidine  20 mg Oral Daily  . feeding supplement  1 Container Oral TID BM  . feeding supplement (PRO-STAT SUGAR FREE 64)  30 mL Oral TID  . fluticasone  2 spray Each Nare Daily  . gabapentin  300 mg Oral QHS  . Gerhardt's butt cream   Topical BID  . guaiFENesin  600 mg Oral Q6H   . hydrocortisone   Rectal BID  . insulin aspart  2-4 Units Subcutaneous TID WC  . lanthanum  1,000 mg Oral TID WC  . metoCLOPramide  5 mg Oral BID WC  . midodrine  10 mg Oral q morning - 10a  . multivitamin  1 tablet Oral QHS  . pantoprazole  20 mg Oral BID  . sodium chloride flush  10-40 mL Intracatheter Q12H  . warfarin  2 mg Oral ONCE-1800  . Warfarin - Pharmacist Dosing Inpatient   Does not apply q1800    LOS: 14 days   Ayo Guarino C 10/25/2016,2:18 PM

## 2016-10-25 NOTE — Progress Notes (Signed)
Physical Therapy Session Note  Patient Details  Name: Deborah [name] MRN: [id number] Date of Birth: 08/25/1956  Today's Date: 10/25/2016 PT Individual Time: 1305-1405 PT Individual Time Calculation (min): 60 min    Short Term Goals:Week 2:  PT Short Term Goal 1 (Week 2): Patient will perform transfers with consistent min A.  PT Short Term Goal 2 (Week 2): Patient will ambulate 75 ft with min A consistently.  PT Short Term Goal 3 (Week 2): Patient will maintain dynamic standing balance x 1 min with min A.  PT Short Term Goal 4 (Week 2): Patient will negotiate up/down 4 stairs using L rail only with min A.   Skilled Therapeutic Interventions/Progress Updates:  Tx focused on functional mobility training, gait with RW, stairs, and NMR via [name] test and balance training. Pt resting in bed upon arrival.   Therapeutic exercise Performed incentive spirometry x10 with multi-modal cues for technique. Pt performed best when she closed her nostrils due to nasal breathing.  Supine heel slides, SLR, and bridging 2x10 each bil with cues for technique.  Serial sit<>stands x10 without UEs.   Berg Balance training 26/56, indicating high fall risk. Pt had most difficulty with tasks involving narrow BOS and dynamic tasks. PT with minimal/no righting reactions and tends to lean posteriorly without UE support.  5xSTS 32 sec from 25" surface and no UEs (across chest)   Therapeutic activity Supine>sit with S Sit<>stand with S and RW and cues for safety, anterior translation over BOS and safety.   Gait in controlled setting with close S and RW 2x150' with cues for safety.  Stairs x8 with 2 rails and Min A, limited by fatigue, marked by decreased hip/knee ext.   Pt encouraged to stay OOB and left up in recliner with all needs in reach.  [name], PT, DPT       Therapy Documentation Precautions:  Precautions Precautions: Fall Restrictions Weight Bearing Restrictions: No General:    Vital Signs: Oxygen Therapy O2 Device: Not Delivered Pain: none    See Function Navigator for Current Functional Status.   Therapy/Group: Individual Therapy  [name], PT, DPT  10/25/2016, 1:25 PM

## 2016-10-25 NOTE — Progress Notes (Signed)
Patient and family members deficit of knowledge, Educated patient and daughter on the importance of incentive and flutter at bedside on 10/24/16. Again tonight the patient notes that devices are not working, nor does family encourage her to use the devices. Did not awaken patient for midnight med because she was sleeping well. Patient needs continuous education on the incentive and flutter. She was encourage to rotate usage of each device when a commercial comes on.

## 2016-10-25 NOTE — Progress Notes (Addendum)
ANTICOAGULATION CONSULT NOTE - Follow Up Consult  Pharmacy Consult for coumadin Indication: atrial fibrillation  No Known Allergies  Patient Measurements: Height: 5\' 1"  (154.9 cm) Weight: 123 lb 0.3 oz (55.8 kg) IBW/kg (Calculated) : 47.8 Heparin Dosing Weight:   Vital Signs: Temp: 98.8 F (37.1 C) (11/22 0524) Temp Source: Oral (11/22 0524) BP: 94/60 (11/22 0524) Pulse Rate: 88 (11/22 0524)  Labs:  Recent Labs  10/23/16 0526 10/24/16 0625 10/24/16 0626 10/24/16 1143 10/25/16 0853  HGB  --  9.9*  --   --   --   HCT  --  31.8*  --   --   --   PLT  --  402*  --   --   --   LABPROT 21.0*  --   --  19.5* 20.5*  INR 1.79  --   --  1.63 1.73  CREATININE  --   --  5.78*  --   --     Estimated Creatinine Clearance: 7.9 mL/min (by C-G formula based on SCr of 5.78 mg/dL (H)).   Assessment: 60 yo female with afib, now SUBtherapeutic on warfarin.  INR is 1.73. Previously supratherapeutic INR likely d/t drug-drug interaction with cipro(11/1-11/5). Amiodarone decreased to 200 mg daily today.  Goal of Therapy:  INR 2 - 2.5 Monitor platelets by anticoagulation protocol: Yes   Plan:  - coumadin 2 mg po x1 - INR in am - monitor for s/sx bleeding, CBC, PO intake, DDI, clinical picture.  Maryanna Shape, PharmD, BCPS  Clinical Pharmacist  Pager: [phone]

## 2016-10-25 NOTE — Progress Notes (Signed)
[place] PHYSICAL MEDICINE & REHABILITATION     PROGRESS NOTE  Subjective/Complaints:  Pt sitting up in her chair this AM.  She continues to have a cough, but states it is manageable.  She states she slept "a little bit". She states she feels better.   ROS: +Cough.  Denies CP, SOB, NV/D.  Objective: Vital Signs: Blood pressure 94/60, pulse 88, temperature 98.8 F (37.1 C), temperature source Oral, resp. rate 18, height 5\' 1"  (1.549 m), weight 55.8 kg (123 lb 0.3 oz), SpO2 96 %. No results found.  Recent Labs  10/24/16 0625  WBC 11.3*  HGB 9.9*  HCT 31.8*  PLT 402*    Recent Labs  10/24/16 0626  NA 127*  K 4.1  CL 91*  GLUCOSE 154*  BUN 26*  CREATININE 5.78*  CALCIUM 8.5*   CBG (last 3)   Recent Labs  10/24/16 1652 10/24/16 2038 10/25/16 0650  GLUCAP 176* 205* 173*    Wt Readings from Last 3 Encounters:  10/24/16 55.8 kg (123 lb 0.3 oz)  10/11/16 58.1 kg (128 lb 1.4 oz)  08/18/16 71.2 kg (156 lb 15.5 oz)    Physical Exam:  BP 94/60 (BP Location: Right Arm)   Pulse 88   Temp 98.8 F (37.1 C) (Oral)   Resp 18   Ht 5\' 1"  (1.549 m)   Wt 55.8 kg (123 lb 0.3 oz)   SpO2 96%   BMI 23.24 kg/m  Constitutional:  She appears well-developed and well-nourished. NAD. Vital signs reviewed.  HENT: [place], AT Eyes: EOM are normal. No discharge.  Cardiovascular: RRR. No JVD. Respiratory: Unlabored. Clear to auscultation.  GI: Soft, NT.  BS+.  Musculoskeletal: She exhibits no tenderness. She exhibits no edema.  Neurological: She is alert and oriented.  Dysphonia unchanged Motor: 4+/5 proximal to distal bilateral UE/LE (stable) Skin: Skin is warm and dry. She is not diaphoretic. No erythema.  Incision healed  Psychiatric: Her affect is flat. Normal behavior.   Assessment/Plan: 1. Functional deficits secondary to debility from multiply cardial and pulmonary issues which require 3+ hours per day of interdisciplinary therapy in a comprehensive inpatient rehab  setting. Physiatrist is providing close team supervision and 24 hour management of active medical problems listed below. Physiatrist and rehab team continue to assess barriers to discharge/monitor patient progress toward functional and medical goals.  Function:  Bathing Bathing position   Position: Wheelchair/chair at sink  Bathing parts Body parts bathed by patient: Right arm, Left arm, Chest, Abdomen, Front perineal area, Buttocks, Right upper leg, Left upper leg, Right lower leg, Left lower leg Body parts bathed by helper: Back  Bathing assist Assist Level: Touching or steadying assistance(Pt > 75%)      Upper Body Dressing/Undressing Upper body dressing   What is the patient wearing?: Pull over shirt/dress, Bra Bra - Perfomed by patient: Thread/unthread right bra strap, Thread/unthread left bra strap, Hook/unhook bra (pull down sports bra) Bra - Perfomed by helper: Hook/unhook bra (pull down sports bra) Pull over shirt/dress - Perfomed by patient: Thread/unthread right sleeve, Thread/unthread left sleeve, Put head through opening, Pull shirt over trunk Pull over shirt/dress - Perfomed by helper: Pull shirt over trunk        Upper body assist Assist Level: Supervision or verbal cues, Set up   Set up : To obtain clothing/put away  Lower Body Dressing/Undressing Lower body dressing   What is the patient wearing?: Pants, Non-skid slipper socks Underwear - Performed by patient: Thread/unthread right underwear leg, Thread/unthread  left underwear leg Underwear - Performed by helper: Pull underwear up/down Pants- Performed by patient: Thread/unthread left pants leg, Pull pants up/down Pants- Performed by helper: Thread/unthread right pants leg Non-skid slipper socks- Performed by patient: Don/doff left sock Non-skid slipper socks- Performed by helper: Don/doff right sock                  Lower body assist Assist for lower body dressing:  (mod assist)       Toileting Toileting Toileting activity did not occur: No continent bowel/bladder event Toileting steps completed by patient: Adjust clothing prior to toileting, Adjust clothing after toileting Toileting steps completed by helper: Performs perineal hygiene    Toileting assist Assist level: Touching or steadying assistance (Pt.75%) (mod assist)   Transfers Chair/bed transfer Chair/bed transfer activity did not occur: Safety/medical concerns Chair/bed transfer method: Stand pivot Chair/bed transfer assist level: Touching or steadying assistance (Pt > 75%) Chair/bed transfer assistive device: Medical sales representative     Max distance: 70 ft Assist level: Supervision or verbal cues   Wheelchair   Type: Manual Max wheelchair distance: 150 ft Assist Level: Supervision or verbal cues  Cognition Comprehension Comprehension assist level: Follows basic conversation/direction with no assist  Expression Expression assist level: Expresses basic needs/ideas: With extra time/assistive device  Social Interaction Social Interaction assist level: Interacts appropriately 90% of the time - Needs monitoring or encouragement for participation or interaction.  Problem Solving Problem solving assist level: Solves basic 50 - 74% of the time/requires cueing 25 - 49% of the time  Memory Memory assist level: Recognizes or recalls 25 - 49% of the time/requires cueing 50 - 75% of the time    Medical Problem List and Plan: 1.  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  Cont CIR--up as tolerated 2.  DVT Prophylaxis/Anticoagulation: Pharmaceutical: Coumadin per pharmacy  INR pending 11/22 3. Pain Management: Hydrocodone prn effective. 4. Mood: LCSW to follow for evaluation and support.  5. Neuropsych: This patient is not fully capable of making decisions on her own behalf. 6. Skin/Wound Care: Routine pressure relief  measures.  7. Fluids/Electrolytes/Nutrition: Strict I/O. Renal panel TTSa with HD.   Diet changed to D2 to help with fatigue  Weaned reglan 11/13, again on 11/14, increased back to 5mg  TID on 11/14 after regurgitation, weaned again 11/20 8. Persistent Leucocytosis:   Completed cipro for bronchitis   WBCs 11.3 on 11/21, appreciate CTS follow-up Labs with HD 10 T2DM:Hgb A1c- 13.3.   Levemir on hold due to hypoglycemia   Slightly labile, will cont to monitor CBG (last 3)   Recent Labs  10/24/16 1652 10/24/16 2038 10/25/16 0650  GLUCAP 176* 205* 173*    11. ESRD: Now HD dependent--to schedule HD in afternoons to help with tolerance of therapy during the day. Educate patient and family on renal diet. Electrolyte abnormalities being managed during HD.  12. Anemia of chronic disease:   Hb 9.9 on 11/21  Monitor for signs of bleeding.   On aranesp for erythropoiesis and nulecit for iron load  Labs with HD 13. CAD s/p DES X 2 and MVR: Monitor for symptoms with increase in activity. On plavix and Lipitor.  14. Acute respiratory failure due to acute pulmonary edema:  Fluid overload compensated with HD.  15. AFlutter s/p DCCV/PAF: HR controlled on amiodarone. On coumadin.  16. Persistent cough with dysphonia: Off antibiotics.   Repeat CXR 11/12 unremarkable for acute process  Cont nebs  Continue robinul for secretions and Robitussin DM for cough, increased on 11/14  Encourage IS with use of flutter valve  CT of chest shows enlarged PA which likely appeared as potential mass on neck CT. Surgery following along  Respiratory panel pending  Appreciate CTS/HF evals  Will consider further medications 17. Sleep disturbance: managed with benadryl prn  LOS (Days) 14 A FACE TO FACE EVALUATION WAS PERFORMED  Ankit Lorie Phenix 10/25/2016 9:15 AM

## 2016-10-26 ENCOUNTER — Inpatient Hospital Stay (HOSPITAL_COMMUNITY): Payer: Self-pay | Admitting: Speech Pathology

## 2016-10-26 LAB — GLUCOSE, CAPILLARY
GLUCOSE-CAPILLARY: 207 mg/dL — AB (ref 65–99)
Glucose-Capillary: 183 mg/dL — ABNORMAL HIGH (ref 65–99)
Glucose-Capillary: 179 mg/dL — ABNORMAL HIGH (ref 65–99)

## 2016-10-26 LAB — PROTIME-INR
INR: 1.96
Prothrombin Time: 22.6 seconds — ABNORMAL HIGH (ref 11.4–15.2)

## 2016-10-26 MED ORDER — DARBEPOETIN ALFA 150 MCG/0.3ML IJ SOSY
150.0000 ug | PREFILLED_SYRINGE | INTRAMUSCULAR | Status: DC
Start: 2016-10-27 — End: 2016-11-01
  Administered 2016-10-29: 150 ug via INTRAVENOUS
  Filled 2016-10-26: qty 0.3

## 2016-10-26 MED ORDER — WARFARIN SODIUM 2 MG PO TABS
1.0000 mg | ORAL_TABLET | Freq: Once | ORAL | Status: AC
  Administered 2016-10-26: 1 mg via ORAL
  Filled 2016-10-26: qty 0.5

## 2016-10-26 MED ORDER — INSULIN DETEMIR 100 UNIT/ML ~~LOC~~ SOLN
3.0000 [IU] | Freq: Every day | SUBCUTANEOUS | Status: DC
  Administered 2016-10-26 – 2016-10-28 (×3): 3 [IU] via SUBCUTANEOUS
  Filled 2016-10-26 (×4): qty 0.03

## 2016-10-26 MED ORDER — ALBUTEROL SULFATE (2.5 MG/3ML) 0.083% IN NEBU
2.5000 mg | INHALATION_SOLUTION | Freq: Three times a day (TID) | RESPIRATORY_TRACT | Status: DC
  Administered 2016-10-27 – 2016-10-29 (×6): 2.5 mg via RESPIRATORY_TRACT
  Filled 2016-10-26 (×6): qty 3

## 2016-10-26 NOTE — Progress Notes (Addendum)
[place] PHYSICAL MEDICINE & REHABILITATION     PROGRESS NOTE  Subjective/Complaints:  Pt sitting up in bed this AM with family present.  She states she slept well overnight.  She is looking forward to thanksgiving dinner with her family.  She notes some improvement in cough with scheduled breathing treatments.   ROS: +Cough.  Denies CP, SOB, NV/D.  Objective: Vital Signs: Blood pressure 118/66, pulse 91, temperature 98.2 F (36.8 C), temperature source Oral, resp. rate 18, height 5\' 1"  (1.549 m), weight 55.8 kg (123 lb 0.3 oz), SpO2 97 %. No results found.  Recent Labs  10/24/16 0625  WBC 11.3*  HGB 9.9*  HCT 31.8*  PLT 402*    Recent Labs  10/24/16 0626  NA 127*  K 4.1  CL 91*  GLUCOSE 154*  BUN 26*  CREATININE 5.78*  CALCIUM 8.5*   CBG (last 3)   Recent Labs  10/25/16 1635 10/25/16 2040 10/26/16 0642  GLUCAP 137* 147* 183*    Wt Readings from Last 3 Encounters:  10/24/16 55.8 kg (123 lb 0.3 oz)  10/11/16 58.1 kg (128 lb 1.4 oz)  08/18/16 71.2 kg (156 lb 15.5 oz)    Physical Exam:  BP 118/66 (BP Location: Right Arm)   Pulse 91   Temp 98.2 F (36.8 C) (Oral)   Resp 18   Ht 5\' 1"  (1.549 m)   Wt 55.8 kg (123 lb 0.3 oz)   SpO2 97%   BMI 23.24 kg/m  Constitutional:  She appears well-developed and well-nourished. NAD. Vital signs reviewed.  HENT: [place], AT Eyes: EOM are normal. No discharge.  Cardiovascular: RRR. No JVD. Respiratory: Unlabored. Clear to auscultation.  GI: Soft, NT.  BS+.  Musculoskeletal: She exhibits no tenderness. She exhibits no edema.  Neurological: She is alert and oriented.  Dysphonia unchanged Motor: 4+/5 proximal to distal bilateral UE/LE (unchanged) Skin: Skin is warm and dry. She is not diaphoretic. No erythema.  Incision healed  Psychiatric: Her affect is flat. Normal behavior.   Assessment/Plan: 1. Functional deficits secondary to debility from multiply cardial and pulmonary issues which require 3+ hours per day of  interdisciplinary therapy in a comprehensive inpatient rehab setting. Physiatrist is providing close team supervision and 24 hour management of active medical problems listed below. Physiatrist and rehab team continue to assess barriers to discharge/monitor patient progress toward functional and medical goals.  Function:  Bathing Bathing position   Position: Wheelchair/chair at sink  Bathing parts Body parts bathed by patient: Right arm, Left arm, Chest, Abdomen, Front perineal area, Buttocks, Right upper leg, Left upper leg, Right lower leg, Left lower leg Body parts bathed by helper: Back  Bathing assist Assist Level: Touching or steadying assistance(Pt > 75%)      Upper Body Dressing/Undressing Upper body dressing   What is the patient wearing?: Pull over shirt/dress Bra - Perfomed by patient: Thread/unthread right bra strap, Thread/unthread left bra strap, Hook/unhook bra (pull down sports bra) Bra - Perfomed by helper: Hook/unhook bra (pull down sports bra) Pull over shirt/dress - Perfomed by patient: Thread/unthread right sleeve, Thread/unthread left sleeve, Put head through opening, Pull shirt over trunk Pull over shirt/dress - Perfomed by helper: Pull shirt over trunk        Upper body assist Assist Level: Supervision or verbal cues   Set up : To obtain clothing/put away  Lower Body Dressing/Undressing Lower body dressing   What is the patient wearing?: Pants, Non-skid slipper socks Underwear - Performed by patient: Thread/unthread right  underwear leg, Thread/unthread left underwear leg Underwear - Performed by helper: Pull underwear up/down Pants- Performed by patient: Thread/unthread left pants leg, Pull pants up/down Pants- Performed by helper: Thread/unthread right pants leg Non-skid slipper socks- Performed by patient: Don/doff right sock, Don/doff left sock Non-skid slipper socks- Performed by helper: Don/doff right sock                  Lower body assist  Assist for lower body dressing: Touching or steadying assistance (Pt > 75%)      Toileting Toileting Toileting activity did not occur: No continent bowel/bladder event Toileting steps completed by patient: Adjust clothing prior to toileting, Adjust clothing after toileting Toileting steps completed by helper: Performs perineal hygiene    Toileting assist Assist level: Touching or steadying assistance (Pt.75%) (mod assist)   Transfers Chair/bed transfer Chair/bed transfer activity did not occur: Safety/medical concerns Chair/bed transfer method: Stand pivot Chair/bed transfer assist level: Touching or steadying assistance (Pt > 75%) Chair/bed transfer assistive device: Armrests, Medical sales representative     Max distance: 150 Assist level: Supervision or verbal cues   Wheelchair   Type: Manual Max wheelchair distance: 150 ft Assist Level: Supervision or verbal cues  Cognition Comprehension Comprehension assist level: Follows basic conversation/direction with no assist  Expression Expression assist level: Expresses basic needs/ideas: With extra time/assistive device  Social Interaction Social Interaction assist level: Interacts appropriately with others with medication or extra time (anti-anxiety, antidepressant).  Problem Solving Problem solving assist level: Solves basic 75 - 89% of the time/requires cueing 10 - 24% of the time  Memory Memory assist level: Recognizes or recalls 25 - 49% of the time/requires cueing 50 - 75% of the time    Medical Problem List and Plan: 1.  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  Cont CIR, on hold today for holiday 2.  DVT Prophylaxis/Anticoagulation: Pharmaceutical: Coumadin per pharmacy  INR subtherapeutic 11/23 3. Pain Management: Hydrocodone prn effective. 4. Mood: LCSW to follow for evaluation and support.  5. Neuropsych: This patient is not fully  capable of making decisions on her own behalf. 6. Skin/Wound Care: Routine pressure relief measures.  7. Fluids/Electrolytes/Nutrition: Strict I/O. Renal panel TTSa with HD.   Diet changed to D2 to help with fatigue  Weaned reglan 11/13, again on 11/14, increased back to 5mg  TID on 11/14 after regurgitation, weaned again 11/20 8. Persistent Leucocytosis:   Completed cipro for bronchitis   WBCs 11.3 on 11/21, appreciate CTS follow-up   Labs with HD 10 T2DM:Hgb A1c- 13.3.   Levemir was held due to hypoglycemia, will start Levemir 3U qhs on 11/23  Will cont to monitor CBG (last 3)   Recent Labs  10/25/16 1635 10/25/16 2040 10/26/16 0642  GLUCAP 137* 147* 183*    11. ESRD: Now HD dependent--to schedule HD in afternoons to help with tolerance of therapy during the day. Educate patient and family on renal diet. Electrolyte abnormalities being managed during HD.  12. Anemia of chronic disease:   Hb 9.9 on 11/21  Monitor for signs of bleeding.   On aranesp for erythropoiesis and nulecit for iron load  Labs with HD 13. CAD s/p DES X 2 and MVR: Monitor for symptoms with increase in activity. On plavix and Lipitor.  14. Acute respiratory failure due to acute pulmonary edema:  Fluid overload compensated with HD.  15. AFlutter s/p DCCV/PAF: HR controlled on amiodarone. On coumadin.  16.  Persistent cough with dysphonia, interfering with speech and swallowing:   Off antibiotics.   Repeat CXR 11/12 unremarkable for acute process  Cont nebs  Continue robinul for secretions and Robitussin DM for cough, increased on 11/14  Encourage IS with use of flutter valve  CT of chest shows enlarged PA which likely appeared as potential mass on neck CT. Surgery following along  Respiratory panel neg 11/21  Appreciate CTS/HF evals  Trail of scheduled Nebs  Will consider trail of thorazine in future  Will consider further medications 17. Sleep disturbance: managed with benadryl prn  LOS (Days) 15 A  FACE TO FACE EVALUATION WAS PERFORMED  Ankit Lorie Phenix 10/26/2016 8:23 AM

## 2016-10-26 NOTE — Progress Notes (Signed)
PRN robitussin, 5 ml given at 0305.Patrici Ranks A

## 2016-10-26 NOTE — Plan of Care (Signed)
Problem: RH SKIN INTEGRITY Goal: RH STG MAINTAIN SKIN INTEGRITY WITH ASSISTANCE STG Maintain Skin Integrity With min Assistance.   Outcome: Not Progressing Doesn't turn and difficult to keep heels elevated. Refusing Prevalon boots.

## 2016-10-26 NOTE — Progress Notes (Signed)
ANTICOAGULATION CONSULT NOTE - Follow Up Consult  Pharmacy Consult for coumadin Indication: atrial fibrillation  No Known Allergies  Patient Measurements: Height: 5\' 1"  (154.9 cm) Weight: 123 lb 0.3 oz (55.8 kg) IBW/kg (Calculated) : 47.8   Vital Signs: Temp: 98.2 F (36.8 C) (11/23 0542) Temp Source: Oral (11/23 0542) BP: 118/66 (11/23 0542) Pulse Rate: 91 (11/23 0542)  Labs:  Recent Labs  10/24/16 0625 10/24/16 0626 10/24/16 1143 10/25/16 0853 10/26/16 0515  HGB 9.9*  --   --   --   --   HCT 31.8*  --   --   --   --   PLT 402*  --   --   --   --   LABPROT  --   --  19.5* 20.5* 22.6*  INR  --   --  1.63 1.73 1.96  CREATININE  --  5.78*  --   --   --     Estimated Creatinine Clearance: 7.9 mL/min (by C-G formula based on SCr of 5.78 mg/dL (H)).   Assessment: 60 yo female with afib, now SUBtherapeutic on warfarin.  INR is 1.96 after 3 doses of 2 mg. Previously supratherapeutic INR likely d/t drug-drug interaction with cipro(11/1-11/5). Amiodarone decreased to 200 mg daily as of 11/22, which could also lead to lower INRs from reduced drug-drug interactions. No signs of bleeding have been noted. Will give lower dose tonight due to INR trend.     Goal of Therapy:  INR 2 - 2.5 Monitor platelets by anticoagulation protocol: Yes   Plan:  - Warfarin 1 mg X 1 tonight  - Daily INR/ CBC Q 72 hours  - Monitor for s/sx bleeding, CBC, PO intake, DDI, clinical picture.  Uvaldo Bristle, PharmD PGY1 Pharmacy Resident  Pager: [phone]

## 2016-10-27 ENCOUNTER — Inpatient Hospital Stay (HOSPITAL_COMMUNITY): Payer: Medicaid Other | Admitting: Physical Therapy

## 2016-10-27 ENCOUNTER — Inpatient Hospital Stay (HOSPITAL_COMMUNITY): Payer: Medicaid Other | Admitting: Occupational Therapy

## 2016-10-27 ENCOUNTER — Inpatient Hospital Stay (HOSPITAL_COMMUNITY): Payer: Medicaid Other | Admitting: Speech Pathology

## 2016-10-27 LAB — CBC
HEMATOCRIT: 32.7 % — AB (ref 36.0–46.0)
HEMOGLOBIN: 10.2 g/dL — AB (ref 12.0–15.0)
MCH: 27.2 pg (ref 26.0–34.0)
MCHC: 31.2 g/dL (ref 30.0–36.0)
MCV: 87.2 fL (ref 78.0–100.0)
Platelets: 309 10*3/uL (ref 150–400)
RBC: 3.75 MIL/uL — ABNORMAL LOW (ref 3.87–5.11)
RDW: 16.8 % — AB (ref 11.5–15.5)
WBC: 10 10*3/uL (ref 4.0–10.5)

## 2016-10-27 LAB — PROTIME-INR
INR: 1.91
Prothrombin Time: 22.1 seconds — ABNORMAL HIGH (ref 11.4–15.2)

## 2016-10-27 LAB — GLUCOSE, CAPILLARY
GLUCOSE-CAPILLARY: 282 mg/dL — AB (ref 65–99)
Glucose-Capillary: 230 mg/dL — ABNORMAL HIGH (ref 65–99)
Glucose-Capillary: 124 mg/dL — ABNORMAL HIGH (ref 65–99)
Glucose-Capillary: 219 mg/dL — ABNORMAL HIGH (ref 65–99)

## 2016-10-27 MED ORDER — LIDOCAINE-PRILOCAINE 2.5-2.5 % EX CREA
1.0000 "application " | TOPICAL_CREAM | CUTANEOUS | Status: DC | PRN
  Filled 2016-10-27: qty 5

## 2016-10-27 MED ORDER — LIDOCAINE HCL (PF) 1 % IJ SOLN: 5.0000 mL | INTRAMUSCULAR | Status: DC | PRN

## 2016-10-27 MED ORDER — SODIUM CHLORIDE 0.9 % IV SOLN: 100.0000 mL | INTRAVENOUS | Status: DC | PRN

## 2016-10-27 MED ORDER — PENTAFLUOROPROP-TETRAFLUOROETH EX AERO: 1.0000 "application " | INHALATION_SPRAY | CUTANEOUS | Status: DC | PRN

## 2016-10-27 MED ORDER — ALTEPLASE 2 MG IJ SOLR
2.0000 mg | Freq: Once | INTRAMUSCULAR | Status: DC | PRN
  Filled 2016-10-27: qty 2

## 2016-10-27 MED ORDER — WARFARIN SODIUM 2 MG PO TABS
2.0000 mg | ORAL_TABLET | Freq: Once | ORAL | Status: AC
  Administered 2016-10-27: 2 mg via ORAL
  Filled 2016-10-27: qty 1

## 2016-10-27 MED ORDER — HEPARIN SODIUM (PORCINE) 1000 UNIT/ML DIALYSIS
20.0000 [IU]/kg | INTRAMUSCULAR | Status: DC | PRN
  Filled 2016-10-27: qty 2

## 2016-10-27 MED ORDER — PHENOL 1.4 % MT LIQD
1.0000 | Freq: Three times a day (TID) | OROMUCOSAL | Status: DC
  Administered 2016-10-27 – 2016-11-01 (×10): 1 via OROMUCOSAL
  Filled 2016-10-27 (×4): qty 177

## 2016-10-27 MED ORDER — HEPARIN SODIUM (PORCINE) 1000 UNIT/ML DIALYSIS
1000.0000 [IU] | INTRAMUSCULAR | Status: DC | PRN
  Filled 2016-10-27: qty 1

## 2016-10-27 NOTE — Progress Notes (Signed)
ANTICOAGULATION CONSULT NOTE - Follow Up Consult  Pharmacy Consult for coumadin Indication: atrial fibrillation  No Known Allergies  Patient Measurements: Height: 5\' 1"  (154.9 cm) Weight: 123 lb 0.3 oz (55.8 kg) IBW/kg (Calculated) : 47.8   Vital Signs: Temp: 97.5 F (36.4 C) (11/24 0527) Temp Source: Oral (11/24 0527) BP: 142/74 (11/24 0527) Pulse Rate: 100 (11/24 0527)  Labs:  Recent Labs  10/25/16 0853 10/26/16 0515 10/27/16 0613  HGB  --   --  10.2*  HCT  --   --  32.7*  PLT  --   --  309  LABPROT 20.5* 22.6* 22.1*  INR 1.73 1.96 1.91    Estimated Creatinine Clearance: 7.9 mL/min (by C-G formula based on SCr of 5.78 mg/dL (H)).   Assessment: 60 yo female with afib, now SUBtherapeutic on warfarin. INR is 1.91 stable. Previously supratherapeutic INR likely d/t drug-drug interaction with cipro (11/1-11/5). Amiodarone decreased to 200 mg daily as of 11/22, which could also lead to lower INRs from reduced drug-drug interactions. No signs of bleeding have been noted. CBC stable.   Goal of Therapy:  INR 2 - 2.5 Monitor platelets by anticoagulation protocol: Yes   Plan:  - Warfarin 2mg  X 1 tonight  - Daily INR - Monitor for s/sx bleeding, CBC, PO intake, DDI, clinical picture.   Elicia Lamp, PharmD, BCPS Clinical Pharmacist 10/27/2016 12:18 PM

## 2016-10-27 NOTE — Progress Notes (Signed)
Physical Therapy Weekly Progress Note  Patient Details  Name: Deborah [name] MRN: [id number] Date of Birth: 12/02/1956  Beginning of progress report period: October 19, 2016 End of progress report period: October 27, 2016  Today's Date: 10/27/2016 PT Individual Time: 1300-1345 PT Individual Time Calculation (min): 45 min   Patient has met 3 of 4 short term goals.  Patient making slow progress but steady toward goals and continues to be limited by decreased endurance, decreased balance, and weakness with continued cough. Plan to initiate hands-on family training in preparation for discharge home planned next week.  Patient continues to demonstrate the following deficits: muscle weakness, decreased cardiorespiratory endurance, decreased standing balance, decreased postural control, and decreased balance strategies and therefore will continue to benefit from skilled PT intervention to enhance overall performance with activity tolerance, balance, postural control, ability to compensate for deficits, functional use of  right upper extremity, right lower extremity, left upper extremity and left lower extremity and coordination.  Patient progressing toward long term goals.  Continue plan of care.  PT Short Term Goals Week 2:  PT Short Term Goal 1 (Week 2): Patient will perform transfers with consistent min A.  PT Short Term Goal 1 - Progress (Week 2): Met PT Short Term Goal 2 (Week 2): Patient will ambulate 75 ft with min A consistently.  PT Short Term Goal 2 - Progress (Week 2): Met PT Short Term Goal 3 (Week 2): Patient will maintain dynamic standing balance x 1 min with min A.  PT Short Term Goal 3 - Progress (Week 2): Met PT Short Term Goal 4 (Week 2): Patient will negotiate up/down 4 stairs using L rail only with min A.  PT Short Term Goal 4 - Progress (Week 2): Not met Week 3:  PT Short Term Goal 1 (Week 3): = LTGs due to anticipated LOS   Skilled Therapeutic Interventions/Progress  Updates:    Patient in wheelchair upon arrival with daughter [name] present initially and two other daughters arriving halfway through session to observe. [name] reports that patient will DC home with [name] initially while she has training for her new job. Discussed placing a chair on landing for negotiating 5 + 5 steps using 1 rail for seated rest break. Patient reporting having a cold and difficulty breathing last night but feeling better this date. Patient performed sit <> stand transfers using RW with supervision and demonstrated increased eccentric control this date with min cues for safe hand placement, verbalizing recalling need to "not flop." Gait training using RW x 150 ft overall with supervision except 1 posterior LOB when removing BUE from RW to throw tissue in trash requiring assist to recover with cues for safety to keep BUE on RW at all times + 50 ft with supervision overall except 1 posterior LOB when turning to back up to seat. Stair training up/down 4 (6") stairs using 2 rails x 2 with step-to pattern and min A overall with 1 seated rest break. Patient ambulated to ortho gym with close supervision. Demonstrated simulated car transfer to low truck height and patient performed transfer using RW with supervision. Initiated family training with one daughter who provided safe and appropriate supervision while patient ambulated back to room using RW. Patient left in bed with all needs in reach and family present.    Therapy Documentation Precautions:  Precautions Precautions: Fall Restrictions Weight Bearing Restrictions: No Pain: Pain Assessment Pain Assessment: No/denies pain   See Function Navigator for Current Functional Status.  Therapy/Group: Individual  Therapy  Carney Living A 10/27/2016, 2:30 PM

## 2016-10-27 NOTE — Progress Notes (Signed)
1. 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 Amio. Coumadin. Using midodrine for BP.  2. ESRD, new start:Dialysis dependent since 08/17/16. TTS schedule via Left TDC (9/28 [facility]). S/P LUA AVG placed on 10/30 (brachioaxillary) by Dr. Donzetta Matters - will be ready by Nov 30. EDW looks to be about 57 kg. (outpt has spot [facility]) Due to Thanksgiving her next HD is today 3. Disposition - Cont rehab. Eventual home, HD at [facility]  Subjective: Interval History: Getting stronger  Objective: Vital signs in last 24 hours: Temp:  [97.5 F (36.4 C)-98 F (36.7 C)] 98 F (36.7 C) (11/24 1409) Pulse Rate:  [96-100] 96 (11/24 1409) Resp:  [16] 16 (11/24 1409) BP: (127-142)/(66-74) 127/66 (11/24 1409) SpO2:  [96 %-99 %] 96 % (11/24 1409) Weight change:   Intake/Output from previous day: 11/23 0701 - 11/24 0700 In: 300 [P.O.:300] Out: -  Intake/Output this shift: Total I/O In: 240 [P.O.:240] Out: -   General appearance: alert and cooperative Resp: clear to auscultation bilaterally Chest wall: no tenderness, TDC on R Cardio: regular rate and rhythm, S1, S2 normal, no murmur, click, rub or gallop Extremities: extremities normal, atraumatic, no cyanosis or edema  Lab Results:  Recent Labs  10/27/16 0613  WBC 10.0  HGB 10.2*  HCT 32.7*  PLT 309   BMET: No results for input(s): NA, K, CL, CO2, GLUCOSE, BUN, CREATININE, CALCIUM in the last 72 hours. No results for input(s): PTH in the last 72 hours. Iron Studies: No results for input(s): IRON, TIBC, TRANSFERRIN, FERRITIN in the last 72 hours. Studies/Results: No results found.  Scheduled: . albuterol  2.5 mg Nebulization TID  . amiodarone  200 mg Oral Daily  . atorvastatin  20 mg Oral q1800  . clopidogrel  75 mg Oral Daily  . darbepoetin (ARANESP) injection - DIALYSIS  150 mcg Intravenous Q Fri-HD  . famotidine  20 mg Oral Daily  . feeding supplement  1  Container Oral TID BM  . feeding supplement (PRO-STAT SUGAR FREE 64)  30 mL Oral TID  . fluticasone  2 spray Each Nare Daily  . gabapentin  300 mg Oral QHS  . Gerhardt's butt cream   Topical BID  . guaiFENesin  600 mg Oral Q6H  . hydrocortisone   Rectal BID  . insulin aspart  2-4 Units Subcutaneous TID WC  . insulin detemir  3 Units Subcutaneous QHS  . lanthanum  1,000 mg Oral TID WC  . metoCLOPramide  5 mg Oral BID WC  . midodrine  10 mg Oral q morning - 10a  . multivitamin  1 tablet Oral QHS  . pantoprazole  20 mg Oral BID  . phenol  1 spray Mouth/Throat TID  . sodium chloride flush  10-40 mL Intracatheter Q12H  . warfarin  2 mg Oral ONCE-1800  . Warfarin - Pharmacist Dosing Inpatient   Does not apply q1800    LOS: 16 days   Prudy Candy C 10/27/2016,4:01 PM

## 2016-10-27 NOTE — Plan of Care (Signed)
Problem: RH Tub/Shower Transfers Goal: LTG Patient will perform tub/shower transfers w/assist (OT) LTG: Patient will perform tub/shower transfers with assist, with/without cues using equipment (OT)  LTG discontinued as pt is completing sponge baths only.

## 2016-10-27 NOTE — Progress Notes (Signed)
Speech Language Pathology Daily Session Note  Patient Details  Name: Deborah [name] MRN: [id number] Date of Birth: 03-Jul-1956  Today's Date: 10/27/2016 SLP Individual Time: 3300-7622 SLP Individual Time Calculation (min): 43 min   Short Term Goals: Week 3: SLP Short Term Goal 1 (Week 3): Patient will verbalize vocal hygiene techniques and utilize when given opportunities with supervision verbal cues.   SLP Short Term Goal 2 (Week 3): Patient will demonstrate functional problem solving  for basic and familiar tasks with supervision verbal cues. SLP Short Term Goal 3 (Week 3): Patient will recall new information with supervision verbal cues for use of external aids.  SLP Short Term Goal 4 (Week 3): Patient will demonstrate efficient mastication and oral clearance with Dys. 3 textures overt 2 sessions prior to upgrade with supervision verbal cues.   Skilled Therapeutic Interventions: Skilled treatment session focused on cognitive goals. SLP facilitated session by providing a novel card task that focused on delayed recall with use of memory compensatory strategies. Patient independently generated associations between 2 items but required Mod A semantic cues for utilization of associations to increase recall throughout task. However, patient recalled all items named at end of task with extra time and Min A verbal cues. Patient left upright in wheelchair with all needs within reach and RN present. Continue with current plan of care.   Function:   Cognition Comprehension Comprehension assist level: Follows basic conversation/direction with no assist  Expression   Expression assist level: Expresses basic needs/ideas: With extra time/assistive device  Social Interaction Social Interaction assist level: Interacts appropriately 90% of the time - Needs monitoring or encouragement for participation or interaction.  Problem Solving Problem solving assist level: Solves basic 75 - 89% of the  time/requires cueing 10 - 24% of the time  Memory Memory assist level: Recognizes or recalls 50 - 74% of the time/requires cueing 25 - 49% of the time    Pain Pain Assessment Pain Assessment: No/denies pain  Therapy/Group: Individual Therapy  Deborah [name] 10/27/2016, 11:31 AM

## 2016-10-27 NOTE — Progress Notes (Signed)
Occupational Therapy Session Note  Patient Details  Name: Deborah [name] MRN: [id number] Date of Birth: 12-30-1955  Today's Date: 10/27/2016 OT Individual Time: 4944-9675 OT Individual Time Calculation (min): 60 min     Short Term Goals:Week 3:  OT Short Term Goal 1 (Week 3): STGs=LTGs due to ELOS  Skilled Therapeutic Interventions/Progress Updates:    Pt received  In bed on 2L of O2 with 100% O2 sat rate. Pt agreeable to doffing O2 for therapy session. Vitals checked 2x at 99% O2 sats on room air.  Pt agreeable to self care this am. She used RW with close S to ambulated to toilet and complete toileting.  She then ambulated to w/c at sink for grooming, bathing, dressing. In standing, she needs touching A without UE support as she tends to lean back on her heels. Pt also needs max cues with safety with stand to sit to toilet and w/c as she positions herself too far forward and to the side and needs cues to back up fully and ensure she is directly in front of the seat. She responded with, "oh yeah, I keep doing that".  Her standing tolerance was about 5 minutes.   Discussed with pt her LTGs of mod I and the safety concerns observed today.  She agreed that receiving Supervision with all tasks was the safest method. Her family and friends are available at all times.   LTGs downgraded from mod I to supervision.    Therapy Documentation Precautions:  Precautions Precautions: Fall Restrictions Weight Bearing Restrictions: No    Pain: Pain Assessment Pain Assessment: No/denies pain ADL: ADL ADL Comments: Please see functional navigator   See Function Navigator for Current Functional Status.   Therapy/Group: Individual Therapy  [place] 10/27/2016, 12:15 PM

## 2016-10-27 NOTE — Plan of Care (Signed)
Problem: RH Balance Goal: LTG Patient will maintain dynamic standing with ADLs (OT) LTG:  Patient will maintain dynamic standing balance with assist during activities of daily living (OT)   LTG downgraded from mod I to S due to cognitive deficits/ safety awareness with mobility.  Problem: RH Grooming Goal: LTG Patient will perform grooming w/assist,cues/equip (OT) LTG: Patient will perform grooming with assist, with/without cues using equipment (OT)  LTG downgraded from mod I to S due to cognitive deficits/ safety awareness with mobility.   Problem: RH Dressing Goal: LTG Patient will perform lower body dressing w/assist (OT) LTG: Patient will perform lower body dressing with assist, with/without cues in positioning using equipment (OT)  LTG downgraded from mod I to S due to cognitive deficits/ safety awareness with mobility.   Problem: RH Toileting Goal: LTG Patient will perform toileting w/assist, cues/equip (OT) LTG: Patient will perform toiletiing (clothes management/hygiene) with assist, with/without cues using equipment (OT)  LTG downgraded from mod I to S due to cognitive deficits/ safety awareness with mobility.   Problem: RH Toilet Transfers Goal: LTG Patient will perform toilet transfers w/assist (OT) LTG: Patient will perform toilet transfers with assist, with/without cues using equipment (OT)  LTG downgraded from mod I to S due to cognitive deficits/ safety awareness with mobility.

## 2016-10-27 NOTE — Progress Notes (Signed)
[facility] PHYSICAL MEDICINE & REHABILITATION     PROGRESS NOTE  Subjective/Complaints:  Pt seen sitting up in bed eating breakfast.  She states she had problems with her cough overnight and needed supplemental O2.  She states she feels better this AM with hot tea.  She enjoyed dinner with her family.    ROS: +Cough.  Denies CP, SOB, NV/D.  Objective: Vital Signs: Blood pressure (!) 142/74, pulse 100, temperature 97.5 F (36.4 C), temperature source Oral, resp. rate 16, height 5\' 1"  (1.549 m), weight 55.8 kg (123 lb 0.3 oz), SpO2 99 %. No results found.  Recent Labs  10/27/16 0613  WBC 10.0  HGB 10.2*  HCT 32.7*  PLT 309   No results for input(s): NA, K, CL, GLUCOSE, BUN, CREATININE, CALCIUM in the last 72 hours.  Invalid input(s): CO CBG (last 3)   Recent Labs  10/26/16 1646 10/26/16 2116 10/27/16 0729  GLUCAP 179* 207* 230*    Wt Readings from Last 3 Encounters:  10/24/16 55.8 kg (123 lb 0.3 oz)  10/11/16 58.1 kg (128 lb 1.4 oz)  08/18/16 71.2 kg (156 lb 15.5 oz)    Physical Exam:  BP (!) 142/74 (BP Location: Right Arm)   Pulse 100   Temp 97.5 F (36.4 C) (Oral)   Resp 16   Ht 5\' 1"  (1.549 m)   Wt 55.8 kg (123 lb 0.3 oz)   SpO2 99%   BMI 23.24 kg/m  Constitutional:  She appears well-developed and well-nourished. NAD. Vital signs reviewed.  HENT: , AT Eyes: EOM are normal. No discharge.  Cardiovascular: RRR. No JVD. Respiratory: Unlabored. Clear to auscultation.  GI: Soft, NT.  BS+.  Musculoskeletal: She exhibits no tenderness. She exhibits no edema.  Neurological: She is alert and oriented.  Dysphonia stable Motor: 4+/5 proximal to distal bilateral UE/LE (stable) Skin: Skin is warm and dry. She is not diaphoretic. No erythema.  Incision healed  Psychiatric: Her affect is flat. Normal behavior.   Assessment/Plan: 1. Functional deficits secondary to debility from multiply cardial and pulmonary issues which require 3+ hours per day of  interdisciplinary therapy in a comprehensive inpatient rehab setting. Physiatrist is providing close team supervision and 24 hour management of active medical problems listed below. Physiatrist and rehab team continue to assess barriers to discharge/monitor patient progress toward functional and medical goals.  Function:  Bathing Bathing position   Position: Wheelchair/chair at sink  Bathing parts Body parts bathed by patient: Right arm, Left arm, Chest, Abdomen, Front perineal area, Buttocks, Right upper leg, Left upper leg, Right lower leg, Left lower leg Body parts bathed by helper: Back  Bathing assist Assist Level: Touching or steadying assistance(Pt > 75%)      Upper Body Dressing/Undressing Upper body dressing   What is the patient wearing?: Pull over shirt/dress Bra - Perfomed by patient: Thread/unthread right bra strap, Thread/unthread left bra strap, Hook/unhook bra (pull down sports bra) Bra - Perfomed by helper: Hook/unhook bra (pull down sports bra) Pull over shirt/dress - Perfomed by patient: Thread/unthread right sleeve, Thread/unthread left sleeve, Put head through opening, Pull shirt over trunk Pull over shirt/dress - Perfomed by helper: Pull shirt over trunk        Upper body assist Assist Level: Supervision or verbal cues   Set up : To obtain clothing/put away  Lower Body Dressing/Undressing Lower body dressing   What is the patient wearing?: Pants, Non-skid slipper socks Underwear - Performed by patient: Thread/unthread right underwear leg, Thread/unthread left underwear  leg Underwear - Performed by helper: Pull underwear up/down Pants- Performed by patient: Thread/unthread left pants leg, Pull pants up/down Pants- Performed by helper: Thread/unthread right pants leg Non-skid slipper socks- Performed by patient: Don/doff right sock, Don/doff left sock Non-skid slipper socks- Performed by helper: Don/doff right sock                  Lower body assist  Assist for lower body dressing: Touching or steadying assistance (Pt > 75%)      Toileting Toileting Toileting activity did not occur: No continent bowel/bladder event Toileting steps completed by patient: Adjust clothing prior to toileting, Adjust clothing after toileting Toileting steps completed by helper: Performs perineal hygiene    Toileting assist Assist level: Touching or steadying assistance (Pt.75%) (mod assist)   Transfers Chair/bed transfer Chair/bed transfer activity did not occur: Safety/medical concerns Chair/bed transfer method: Stand pivot Chair/bed transfer assist level: Touching or steadying assistance (Pt > 75%) Chair/bed transfer assistive device: Armrests, Medical sales representative     Max distance: 150 Assist level: Supervision or verbal cues   Wheelchair   Type: Manual Max wheelchair distance: 150 ft Assist Level: Supervision or verbal cues  Cognition Comprehension Comprehension assist level: Follows basic conversation/direction with no assist  Expression Expression assist level: Expresses basic needs/ideas: With extra time/assistive device  Social Interaction Social Interaction assist level: Interacts appropriately 75 - 89% of the time - Needs redirection for appropriate language or to initiate interaction.  Problem Solving Problem solving assist level: Solves basic 75 - 89% of the time/requires cueing 10 - 24% of the time  Memory Memory assist level: Recognizes or recalls 50 - 74% of the time/requires cueing 25 - 49% of the time    Medical Problem List and Plan: 1.  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  Cont CIR 2.  DVT Prophylaxis/Anticoagulation: Pharmaceutical: Coumadin per pharmacy  INR subtherapeutic 11/24 3. Pain Management: Hydrocodone prn effective. 4. Mood: LCSW to follow for evaluation and support.  5. Neuropsych: This patient is not fully capable  of making decisions on her own behalf. 6. Skin/Wound Care: Routine pressure relief measures.  7. Fluids/Electrolytes/Nutrition: Strict I/O. Renal panel TTSa with HD.   Diet changed to D2 to help with fatigue  Weaned reglan 11/13, again on 11/14, increased back to 5mg  TID on 11/14 after regurgitation, weaned again 11/20 8. Leucocytosis: ?Resolved  Completed cipro for bronchitis   WBCs 10.0 on 11/24, appreciate CTS follow-up   Labs with HD 10 T2DM:Hgb A1c- 13.3.   Levemir was held due to hypoglycemia, started Levemir 3U qhs on 11/23  Will cont to monitor, consider further increase tomorrow CBG (last 3)   Recent Labs  10/26/16 1646 10/26/16 2116 10/27/16 0729  GLUCAP 179* 207* 230*    11. ESRD: Now HD dependent--to schedule HD in afternoons to help with tolerance of therapy during the day. Educate patient and family on renal diet. Electrolyte abnormalities being managed during HD.  12. Anemia of chronic disease:   Hb 10.2 on 11/24  Monitor for signs of bleeding.   On aranesp for erythropoiesis and nulecit for iron load  Labs with HD 13. CAD s/p DES X 2 and MVR: Monitor for symptoms with increase in activity. On plavix and Lipitor.  14. Acute respiratory failure due to acute pulmonary edema:  Fluid overload compensated with HD.  15. AFlutter s/p DCCV/PAF: HR controlled on amiodarone. On coumadin.  16. Persistent  cough with dysphonia, interfering with speech and swallowing:   Off antibiotics.   Repeat CXR 11/12 unremarkable for acute process  Cont nebs  Continue robinul for secretions and Robitussin DM for cough, increased on 11/14  Encourage IS with use of flutter valve  CT of chest shows enlarged PA which likely appeared as potential mass on neck CT. Surgery following along  Respiratory panel neg 11/21  Appreciate CTS/HF evals  Trail of scheduled Nebs, respiratory d/ced  Will trial throat spray  Will consider trail of thorazine in future  Will consider further medications 17.  Sleep disturbance: managed with benadryl prn  LOS (Days) 16 A FACE TO FACE EVALUATION WAS PERFORMED  Ankit Lorie Phenix 10/27/2016 8:25 AM

## 2016-10-28 ENCOUNTER — Inpatient Hospital Stay (HOSPITAL_COMMUNITY): Payer: Self-pay | Admitting: Occupational Therapy

## 2016-10-28 ENCOUNTER — Inpatient Hospital Stay (HOSPITAL_COMMUNITY): Payer: Medicaid Other | Admitting: Occupational Therapy

## 2016-10-28 LAB — PROTIME-INR
INR: 1.66
PROTHROMBIN TIME: 19.8 s — AB (ref 11.4–15.2)

## 2016-10-28 LAB — GLUCOSE, CAPILLARY
GLUCOSE-CAPILLARY: 124 mg/dL — AB (ref 65–99)
GLUCOSE-CAPILLARY: 297 mg/dL — AB (ref 65–99)
Glucose-Capillary: 240 mg/dL — ABNORMAL HIGH (ref 65–99)
Glucose-Capillary: 196 mg/dL — ABNORMAL HIGH (ref 65–99)

## 2016-10-28 MED ORDER — WARFARIN SODIUM 3 MG PO TABS
3.0000 mg | ORAL_TABLET | Freq: Once | ORAL | Status: AC
  Administered 2016-10-28: 3 mg via ORAL
  Filled 2016-10-28 (×2): qty 1

## 2016-10-28 NOTE — Progress Notes (Signed)
Occupational Therapy Session Note  Patient Details  Name: Deborah [name] MRN: [id number] Date of Birth: 1956-06-22  Today's Date: 10/28/2016 OT Individual Time: 1300-1343 OT Individual Time Calculation (min): 43 min   Skilled Therapeutic Interventions/Progress Updates:   Skilled OT session completed with focus on cognition/memory, activity tolerance, and safety awareness. Pt was lying in bed at time of arrival, agreeable to tx. RN reported that 02 sats had just been checked: 100% on room air. Stand pivot with RW completed with Min A to w/c. Pt self propelled down hallway with extra time and instruction on PLB. Once in dayroom, pt completed scavenger hunt hiding items in various places and retrieving them afterwards. Pt required Min-Mod A for navigating w/c around tight spaces and for safety awareness. Questioning/instructional cues required for problem solving throughout. Pt able to recall 7/7 item locations with min cuing. Short term memory recall of items 3/7. Therapeutic use of music utilized today to increase volition. Afterwards pt self propelled back to room, able to pathfind with min vcs. Pt was left in w/c with all needs within reach at time of departure.   Therapy Documentation Precautions:  Precautions Precautions: Fall Restrictions Weight Bearing Restrictions: No   Vital Signs: 02 sats 99% during activity engagement Therapy Vitals Temp: 98.6 F (37 C) Temp Source: Oral Pulse Rate: (!) 105 Resp: 18 BP: 135/69 Patient Position (if appropriate): Sitting Oxygen Therapy SpO2: 100 % O2 Device: Not Delivered Pain: No c/o pain during session    ADL: ADL ADL Comments: Please see functional navigator    See Function Navigator for Current Functional Status.   Therapy/Group: Individual Therapy  Cherry Wittwer A Starlett Pehrson 10/28/2016, 3:55 PM

## 2016-10-28 NOTE — Progress Notes (Signed)
Occupational Therapy Session Note  Patient Details  Name: Deborah [name] MRN: [id number] Date of Birth: July 30, 1956  Today's Date: 10/28/2016 OT Individual Time: 1000-1045 and 1440-1530 OT Individual Time Calculation (min): 45 min and 50 min    Short Term Goals:Week 2:  OT Short Term Goal 1 (Week 2): Pt will tolerate 5 minutes standing at the sink to complete ADL tasks without seated rest break.  OT Short Term Goal 2 (Week 2): Pt will complete LB dressing with Min A OT Short Term Goal 3 (Week 2): Pt will maintain dynamic standing balance during ADL task with overall Min A OT Short Term Goal 4 (Week 2): Pt will complete toileting with Mod A OT Short Term Goal 5 (Week 2): Pt will complete toilet transfer with overall Min A and LRAD  Skilled Therapeutic Interventions/Progress Updates:    Visit 1:  No c/o pain Pt received in bed agreeable to therapy.  She did not feel the need to bathe today and already had on a long nightgown shirt she wanted to wear today. Pt sat to EOB and wanted to ambulate to therapy. With RW ambulated with touching A, halfway to gym after 50 ft she had a LOB due to L knee buckling. Mod A to regain balance with no fall. Pt rested in arm chair before transferring to w/c. Continued to rehab gym in w/c. Transferred to mat to work on LE and UE strength with AROM exercises. Ambulated halfway back to room with RW and then was transported via w/c to room. Pt opted to stay sitting up in w/c for lunch. Pt in room with all needs met.  Visit 2:  No c/o pain Pt resting in bed with family visiting. Pt agreeable to therapy. Pt sat to EOB and used RW to ambulate to toilet with steadying A. She did not follow cues to reach L hand back for bar and plopped on the toilet. Reviewed safety with stand to sit especially to low surfaces. Pt needed min A to stand from low toilet to RW and was able to cleanse self.  Ambulated to chair in room for vital signs taken by nursing. She then walked 100 feet  to w/c placed by nursing station. She wanted to feel some fresh air. Due to her security status, she can not go outside, but in the day room she could walk to open window.  After standing for a few minutes had a minor LOB posterior with steadying A to recover. Discussed safe stance as pt tends to put too much wt through her heels.  Sat for awhile and discussed plans for discharge with the family support she will have at home.  Pt taken back to her room and pt transferred into bed. Bed alarm set and pt with  All needs met.     Therapy Documentation Precautions:  Precautions Precautions: Fall Restrictions Weight Bearing Restrictions: No    Vital Signs: Therapy Vitals Temp: 98.2 F (36.8 C) Temp Source: Oral Pulse Rate: 97 Resp: 17 BP: 115/70 Patient Position (if appropriate): Sitting Oxygen Therapy SpO2: 97 % O2 Device: Not Delivered    ADL: ADL ADL Comments: Please see functional navigator  See Function Navigator for Current Functional Status.   Therapy/Group: Individual Therapy  SAGUIER,JULIA 10/28/2016, 8:32 AM

## 2016-10-28 NOTE — Progress Notes (Signed)
Patient came back from dialysis at around 2030, per dialysis RN doctor's order to stop dialysis due to an emergent dialysis for another patient. Rehab RN spoke to the daughter on the phone and she was aware of patient not being able to have full dialysis. Will call daughter for updates on her mom's next dialysis schedule.

## 2016-10-28 NOTE — Progress Notes (Signed)
PHYSICAL MEDICINE & REHABILITATION     PROGRESS NOTE  Subjective/Complaints:  Pt laying in bed this AM.  She has increased work of breathing, but sats ate WNL.  Daughter present, who is upset that pt's dialysis session was cut short yesterday due to an emergency with another patient.  She thinks this is the reason for her cough.  Spent significant time with explaining that her cough has been present for >1 month, but daughter reluctant to accept this.  She also is upset about the food. Per nursing, pt stated that her daughter makes her upset and anxious at times.   ROS: +Cough.  Denies CP, SOB, NV/D.  Objective: Vital Signs: Blood pressure 115/70, pulse 97, temperature 98.2 F (36.8 C), temperature source Oral, resp. rate 17, height 5\' 1"  (1.549 m), weight 56.1 kg (123 lb 10.9 oz), SpO2 97 %. No results found.  Recent Labs  10/27/16 0613  WBC 10.0  HGB 10.2*  HCT 32.7*  PLT 309   No results for input(s): NA, K, CL, GLUCOSE, BUN, CREATININE, CALCIUM in the last 72 hours.  Invalid input(s): CO CBG (last 3)   Recent Labs  10/27/16 1613 10/27/16 2035 10/28/16 0638  GLUCAP 124* 219* 240*    Wt Readings from Last 3 Encounters:  10/28/16 56.1 kg (123 lb 10.9 oz)  10/11/16 58.1 kg (128 lb 1.4 oz)  08/18/16 71.2 kg (156 lb 15.5 oz)    Physical Exam:  BP 115/70 (BP Location: Right Arm)   Pulse 97   Temp 98.2 F (36.8 C) (Oral)   Resp 17   Ht 5\' 1"  (1.549 m)   Wt 56.1 kg (123 lb 10.9 oz)   SpO2 97%   BMI 23.37 kg/m  Constitutional:  She appears well-developed and well-nourished. NAD. Vital signs reviewed.  HENT: [place], AT Eyes: EOM are normal. No discharge.  Cardiovascular: RRR. No JVD. Respiratory: Clear to auscultation. Increased WOB today. GI: Soft, NT.  BS+.  Musculoskeletal: She exhibits no tenderness. She exhibits no edema.  Neurological: She is alert and oriented.  Dysphonia stable Motor: 4+/5 proximal to distal bilateral UE/LE (unchanged) Skin: Skin  is warm and dry. She is not diaphoretic. No erythema.  Incision healed  Psychiatric: Her affect is flat. Normal behavior.   Assessment/Plan: 1. Functional deficits secondary to debility from multiply cardial and pulmonary issues which require 3+ hours per day of interdisciplinary therapy in a comprehensive inpatient rehab setting. Physiatrist is providing close team supervision and 24 hour management of active medical problems listed below. Physiatrist and rehab team continue to assess barriers to discharge/monitor patient progress toward functional and medical goals.  Function:  Bathing Bathing position   Position: Wheelchair/chair at sink  Bathing parts Body parts bathed by patient: Right arm, Left arm, Chest, Abdomen, Front perineal area, Buttocks, Right upper leg, Left upper leg, Right lower leg, Left lower leg Body parts bathed by helper: Back  Bathing assist Assist Level: Touching or steadying assistance(Pt > 75%)      Upper Body Dressing/Undressing Upper body dressing   What is the patient wearing?: Pull over shirt/dress, Bra Bra - Perfomed by patient: Thread/unthread right bra strap, Thread/unthread left bra strap, Hook/unhook bra (pull down sports bra) Bra - Perfomed by helper: Hook/unhook bra (pull down sports bra) Pull over shirt/dress - Perfomed by patient: Thread/unthread right sleeve, Thread/unthread left sleeve, Put head through opening, Pull shirt over trunk Pull over shirt/dress - Perfomed by helper: Pull shirt over trunk  Upper body assist Assist Level: Supervision or verbal cues   Set up : To obtain clothing/put away  Lower Body Dressing/Undressing Lower body dressing   What is the patient wearing?: Pants, Non-skid slipper socks Underwear - Performed by patient: Thread/unthread right underwear leg, Thread/unthread left underwear leg Underwear - Performed by helper: Pull underwear up/down Pants- Performed by patient: Thread/unthread left pants leg, Pull  pants up/down, Thread/unthread right pants leg Pants- Performed by helper: Thread/unthread right pants leg Non-skid slipper socks- Performed by patient: Don/doff right sock, Don/doff left sock Non-skid slipper socks- Performed by helper: Don/doff right sock                  Lower body assist Assist for lower body dressing: Touching or steadying assistance (Pt > 75%)      Toileting Toileting Toileting activity did not occur: No continent bowel/bladder event Toileting steps completed by patient: Adjust clothing prior to toileting, Adjust clothing after toileting Toileting steps completed by helper: Performs perineal hygiene    Toileting assist Assist level: Touching or steadying assistance (Pt.75%) (mod assist)   Transfers Chair/bed transfer Chair/bed transfer activity did not occur: Safety/medical concerns Chair/bed transfer method: Ambulatory Chair/bed transfer assist level: Touching or steadying assistance (Pt > 75%) Chair/bed transfer assistive device: Armrests, Medical sales representative     Max distance: 150 Assist level: Supervision or verbal cues   Wheelchair   Type: Manual Max wheelchair distance: 150 ft Assist Level: Supervision or verbal cues  Cognition Comprehension Comprehension assist level: Follows basic conversation/direction with no assist  Expression Expression assist level: Expresses basic needs/ideas: With extra time/assistive device  Social Interaction Social Interaction assist level: Interacts appropriately 90% of the time - Needs monitoring or encouragement for participation or interaction.  Problem Solving Problem solving assist level: Solves basic 75 - 89% of the time/requires cueing 10 - 24% of the time  Memory Memory assist level: Recognizes or recalls 50 - 74% of the time/requires cueing 25 - 49% of the time    Medical Problem List and Plan: 1.  Weakness, poor activity tolerance, dysphagia, dysarthria secondary to debility from multiply  cardial and pulmonary issues.  Cont CIR 2.  DVT Prophylaxis/Anticoagulation: Pharmaceutical: Coumadin per pharmacy  INR subtherapeutic 11/25 3. Pain Management: Hydrocodone prn effective. 4. Mood: LCSW to follow for evaluation and support.  5. Neuropsych: This patient is not fully capable of making decisions on her own behalf. 6. Skin/Wound Care: Routine pressure relief measures.  7. Fluids/Electrolytes/Nutrition: Strict I/O. Renal panel TTSa with HD.   Diet changed to D2 to help with fatigue  Weaned reglan 11/13, again on 11/14, increased back to 5mg  TID on 11/14 after regurgitation, weaned again 11/20 8. Leucocytosis: ?Resolved  Completed cipro for bronchitis   WBCs 10.0 on 11/24, appreciate CTS follow-up   Labs with HD 10 T2DM:Hgb A1c- 13.3.   Levemir was held due to hypoglycemia, started Levemir 3U qhs on 11/23  Labile, will not make adjustments due to risk of hypoglycemia   Will cont to monitor CBG (last 3)   Recent Labs  10/27/16 1613 10/27/16 2035 10/28/16 0638  GLUCAP 124* 219* 240*    11. ESRD: Now HD dependent--to schedule HD in afternoons to help with tolerance of therapy during the day. Educate patient and family on renal diet. Electrolyte abnormalities being managed during HD.  12. Anemia of chronic disease:   Hb 10.2 on 11/24  Monitor for signs of bleeding.   On aranesp for erythropoiesis and nulecit  for iron load  Labs with HD 13. CAD s/p DES X 2 and MVR: Monitor for symptoms with increase in activity. On plavix and Lipitor.  14. Acute respiratory failure due to acute pulmonary edema:  Fluid overload compensated with HD.  15. AFlutter s/p DCCV/PAF: HR controlled on amiodarone. On coumadin.  16. Persistent cough with dysphonia, interfering with speech and swallowing:   Off antibiotics.   Repeat CXR 11/12 unremarkable for acute process  Cont nebs  Continue robinul for secretions and Robitussin DM for cough, increased on 11/14  Encourage IS with use of flutter  valve  CT of chest shows enlarged PA which likely appeared as potential mass on neck CT. Surgery following along  Respiratory panel neg 11/21  Appreciate CTS/HF evals  Trail of scheduled Nebs, respiratory d/ced  Trial throat spray, pt notes improvement  Will consider trail of thorazine in future 17. Sleep disturbance: managed with benadryl prn  LOS (Days) 17 A FACE TO FACE EVALUATION WAS PERFORMED  Ayden Apodaca Lorie Phenix 10/28/2016 7:55 AM

## 2016-10-28 NOTE — Progress Notes (Signed)
ANTICOAGULATION CONSULT NOTE - Follow Up Consult  Pharmacy Consult for coumadin Indication: atrial fibrillation  No Known Allergies  Patient Measurements: Height: 5\' 1"  (154.9 cm) Weight: 123 lb 10.9 oz (56.1 kg) IBW/kg (Calculated) : 47.8   Vital Signs: Temp: 98.2 F (36.8 C) (11/25 0604) Temp Source: Oral (11/25 0604) BP: 115/70 (11/25 0604) Pulse Rate: 97 (11/25 0604)  Labs:  Recent Labs  10/26/16 0515 10/27/16 0613 10/28/16 0525  HGB  --  10.2*  --   HCT  --  32.7*  --   PLT  --  309  --   LABPROT 22.6* 22.1* 19.8*  INR 1.96 1.91 1.66    Estimated Creatinine Clearance: 7.9 mL/min (by C-G formula based on SCr of 5.78 mg/dL (H)).   Assessment: 60 yo female with afib, now SUBtherapeutic on warfarin. INR 1.91>1.66 after 2mg .  Previously supratherapeutic INR likely d/t drug-drug interaction with cipro (11/1-11/5). Amiodarone decreased to 200 mg daily as of 11/22, which could also lead to lower INRs from reduced drug-drug interactions. No signs of bleeding have been noted. CBC stable. Will increase dose tonight.   Goal of Therapy:  INR 2 - 2.5 Monitor platelets by anticoagulation protocol: Yes   Plan:  - Warfarin 3mg  X 1 tonight  - Daily INR - Monitor for s/sx bleeding, CBC, PO intake, DDI, clinical picture.  Dierdre Harness, Cain Sieve, PharmD Clinical Pharmacy Resident [phone] (Pager) 10/28/2016 1:41 PM

## 2016-10-29 ENCOUNTER — Inpatient Hospital Stay (HOSPITAL_COMMUNITY): Payer: Medicaid Other | Admitting: Physical Therapy

## 2016-10-29 ENCOUNTER — Inpatient Hospital Stay (HOSPITAL_COMMUNITY): Payer: Medicaid Other

## 2016-10-29 DIAGNOSIS — R7309 Other abnormal glucose: Secondary | ICD-10-CM

## 2016-10-29 LAB — GLUCOSE, CAPILLARY
GLUCOSE-CAPILLARY: 388 mg/dL — AB (ref 65–99)
GLUCOSE-CAPILLARY: 195 mg/dL — AB (ref 65–99)
GLUCOSE-CAPILLARY: 268 mg/dL — AB (ref 65–99)
Glucose-Capillary: 232 mg/dL — ABNORMAL HIGH (ref 65–99)
Glucose-Capillary: 210 mg/dL — ABNORMAL HIGH (ref 65–99)

## 2016-10-29 LAB — PROTIME-INR
INR: 1.64
PROTHROMBIN TIME: 19.6 s — AB (ref 11.4–15.2)

## 2016-10-29 MED ORDER — WARFARIN SODIUM 2 MG PO TABS
4.0000 mg | ORAL_TABLET | Freq: Once | ORAL | Status: AC
Start: 2016-10-29 — End: 2016-10-29
  Administered 2016-10-29: 4 mg via ORAL
  Filled 2016-10-29: qty 2

## 2016-10-29 MED ORDER — INSULIN DETEMIR 100 UNIT/ML ~~LOC~~ SOLN
5.0000 [IU] | Freq: Every day | SUBCUTANEOUS | Status: DC
Start: 2016-10-29 — End: 2016-10-31
  Administered 2016-10-29 – 2016-10-30 (×2): 5 [IU] via SUBCUTANEOUS
  Filled 2016-10-29 (×3): qty 0.05

## 2016-10-29 MED ORDER — ALBUTEROL SULFATE (2.5 MG/3ML) 0.083% IN NEBU
INHALATION_SOLUTION | RESPIRATORY_TRACT | Status: AC
  Administered 2016-10-29: 2.5 mg
  Filled 2016-10-29: qty 3

## 2016-10-29 MED ORDER — ALBUTEROL SULFATE (2.5 MG/3ML) 0.083% IN NEBU
2.5000 mg | INHALATION_SOLUTION | Freq: Two times a day (BID) | RESPIRATORY_TRACT | Status: DC
  Administered 2016-10-30 (×3): 2.5 mg via RESPIRATORY_TRACT
  Filled 2016-10-29 (×3): qty 3

## 2016-10-29 MED ORDER — DARBEPOETIN ALFA 150 MCG/0.3ML IJ SOSY
PREFILLED_SYRINGE | INTRAMUSCULAR | Status: AC
  Administered 2016-10-29: 150 ug via INTRAVENOUS
  Filled 2016-10-29: qty 0.3

## 2016-10-29 NOTE — Progress Notes (Signed)
Occupational Therapy Session Note  Patient Details  Name: HONEST SAFRANEK MRN: [id number] Date of Birth: [birth year]  Today's Date: 10/29/2016 OT Individual Time: 1330-1415 OT Individual Time Calculation (min): 45 min   Short Term Goals: Week 3:  OT Short Term Goal 1 (Week 3): STGs=LTGs due to ELOS  Skilled Therapeutic Interventions/Progress Updates:   Therapeutic activity with focus on family education (son [name] present), functional mobility, functional endurance, and safety awareness.   Pt received from physical therapy session with physical therapist in pt's room communicating with family members on phone regarding discharge recommendations.  Pt's son continued phone conversation follow-up with pt's sisters during session, often with aggravated tone, although following OT and pt through functional mobility in ADL kitchen, transfers to low couch, and re-education on home safety.   Patient's son terminated phone call and then engaged in family education on methods of w/c bump-up stairs, completing practice of bump-up with OT using gym training stairs for two flights of stairs with pt in her w/c.   Written directions for w/c bump up placed in Patient Handbook.   At end of session, OT and pt's son discussed discharge plan and pt's son was advised to seek social work assist with resolving possible family stress relating to after care of pt at her home.   Pt demo'd mild SOB during activities with need for rest breaks.   Pt required only min vc for transfer safety to lower herself using armrests, when available.  Therapy Documentation Precautions:  Precautions Precautions: Fall Restrictions Weight Bearing Restrictions: No   Vital Signs: Therapy Vitals Temp: 98.3 F (36.8 C) Temp Source: Oral Pulse Rate: 98 Resp: 17 BP: (!) 90/53 Patient Position (if appropriate): Sitting Oxygen Therapy SpO2: 97 % O2 Device: Not Delivered   Pain: Pain Assessment Pain Assessment: No/denies pain    ADL: ADL ADL Comments: Please see functional navigator   See Function Navigator for Current Functional Status.   Therapy/Group: Individual Therapy  Sorina Derrig 10/29/2016, 3:56 PM

## 2016-10-29 NOTE — Progress Notes (Signed)
  Assessment:  1.  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 Amio. Coumadin. Using midodrine for BP.  1. ESRD, new start:Dialysis dependent since 08/17/16. TTS schedule via [facility] (9/28 [facility]). S/P LUA AVG placed on 10/30 (brachioaxillary) by Dr. Donzetta Matters - will be ready by Nov 30. EDW looks to be about 57 kg. (outpt has spot [place]) Finished HD early this AM, next HD on Tuesday. ? DC Wed 2. Disposition - Cont rehab. Eventual home, HD at [facility]  Subjective: Interval History: Feeling well working with PT/OT  Objective: Vital signs in last 24 hours: Temp:  [97.8 F (36.6 C)-98.6 F (37 C)] 97.8 F (36.6 C) (11/26 0652) Pulse Rate:  [98-105] 101 (11/26 0652) Resp:  [17-20] 17 (11/26 0652) BP: (96-141)/(47-80) 101/47 (11/26 0652) SpO2:  [92 %-100 %] 97 % (11/26 0948) Weight:  [56 kg (123 lb 7.3 oz)-57.6 kg (126 lb 15.8 oz)] 56 kg (123 lb 7.3 oz) (11/26 0615) Weight change: -0.2 kg (-7.1 oz)  Intake/Output from previous day: 11/25 0701 - 11/26 0700 In: 190 [P.O.:190] Out: 1500  Intake/Output this shift: Total I/O In: 230 [P.O.:230] Out: -   General appearance: alert, cooperative and hoarse voice Resp: clear to auscultation bilaterally Chest wall: no tenderness Cardio: regular rate and rhythm, S1, S2 normal, no murmur, click, rub or gallop Extremities: extremities normal, atraumatic, no cyanosis or edema AVF  Lab Results:  Recent Labs  10/27/16 0613  WBC 10.0  HGB 10.2*  HCT 32.7*  PLT 309   BMET: No results for input(s): NA, K, CL, CO2, GLUCOSE, BUN, CREATININE, CALCIUM in the last 72 hours. No results for input(s): PTH in the last 72 hours. Iron Studies: No results for input(s): IRON, TIBC, TRANSFERRIN, FERRITIN in the last 72 hours. Studies/Results: No results found.  Scheduled: . albuterol  2.5 mg Nebulization BID  . amiodarone  200 mg Oral Daily  . atorvastatin  20 mg Oral  q1800  . clopidogrel  75 mg Oral Daily  . darbepoetin (ARANESP) injection - DIALYSIS  150 mcg Intravenous Q Fri-HD  . famotidine  20 mg Oral Daily  . feeding supplement  1 Container Oral TID BM  . feeding supplement (PRO-STAT SUGAR FREE 64)  30 mL Oral TID  . fluticasone  2 spray Each Nare Daily  . gabapentin  300 mg Oral QHS  . Gerhardt's butt cream   Topical BID  . guaiFENesin  600 mg Oral Q6H  . hydrocortisone   Rectal BID  . insulin aspart  2-4 Units Subcutaneous TID WC  . insulin detemir  5 Units Subcutaneous QHS  . lanthanum  1,000 mg Oral TID WC  . metoCLOPramide  5 mg Oral BID WC  . midodrine  10 mg Oral q morning - 10a  . multivitamin  1 tablet Oral QHS  . pantoprazole  20 mg Oral BID  . phenol  1 spray Mouth/Throat TID  . sodium chloride flush  10-40 mL Intracatheter Q12H  . warfarin  4 mg Oral ONCE-1800  . Warfarin - Pharmacist Dosing Inpatient   Does not apply q1800    LOS: 18 days   Quest Tavenner C 10/29/2016,2:08 PM

## 2016-10-29 NOTE — Progress Notes (Addendum)
Dialysis asked rn if patient can go to dialysis at 2 am or 7 am., Patient wanted to go at 7am. Later on, dialysis called again to check on patient, rn told them that they can pick her up earlier since her therapy schedule starts at 10 am. Rn informed the patient that she might go to dialysis around 430 am if that is ok due to her therapy schedule and she agreed. Dialysis picked her up at around 4:10 am.

## 2016-10-29 NOTE — Progress Notes (Signed)
Dr. Posey Pronto notified of elevated blood sugar.  Order to give 4 units of insulin as previously ordered.

## 2016-10-29 NOTE — Progress Notes (Signed)
[place] PHYSICAL MEDICINE & REHABILITATION     PROGRESS NOTE  Subjective/Complaints:  Pt seen laying in bed.  Daughter at bedside.  She recently returned from HD.  Pt is still confused about her cough and thinks she has a cold.   ROS: +Cough.  Denies CP, SOB, NV/D.  Objective: Vital Signs: Blood pressure (!) 101/47, pulse (!) 101, temperature 97.8 F (36.6 C), temperature source Oral, resp. rate 17, height 5\' 1"  (1.549 m), weight 56 kg (123 lb 7.3 oz), SpO2 98 %. No results found.  Recent Labs  10/27/16 0613  WBC 10.0  HGB 10.2*  HCT 32.7*  PLT 309   No results for input(s): NA, K, CL, GLUCOSE, BUN, CREATININE, CALCIUM in the last 72 hours.  Invalid input(s): CO CBG (last 3)   Recent Labs  10/28/16 1648 10/28/16 2042 10/29/16 0645  GLUCAP 196* 297* 210*    Wt Readings from Last 3 Encounters:  10/29/16 56 kg (123 lb 7.3 oz)  10/11/16 58.1 kg (128 lb 1.4 oz)  08/18/16 71.2 kg (156 lb 15.5 oz)    Physical Exam:  BP (!) 101/47 (BP Location: Right Arm)   Pulse (!) 101   Temp 97.8 F (36.6 C) (Oral)   Resp 17   Ht 5\' 1"  (1.549 m)   Wt 56 kg (123 lb 7.3 oz)   SpO2 98%   BMI 23.33 kg/m  Constitutional:  She appears well-developed and well-nourished. NAD. Vital signs reviewed.  HENT: [place], AT Eyes: EOM are normal. No discharge.  Cardiovascular: RRR. No JVD. Respiratory: Clear to auscultation. Unlabored. GI: Soft, NT.  BS+.  Musculoskeletal: She exhibits no tenderness. She exhibits no edema.  Neurological: She is alert and oriented.  Dysphonia stable Motor: 4+/5 proximal to distal bilateral UE/LE (stable) Skin: Skin is warm and dry. She is not diaphoretic. No erythema.  Incision healed  Psychiatric: Her affect is flat. Normal behavior.   Assessment/Plan: 1. Functional deficits secondary to debility from multiply cardial and pulmonary issues which require 3+ hours per day of interdisciplinary therapy in a comprehensive inpatient rehab setting. Physiatrist is  providing close team supervision and 24 hour management of active medical problems listed below. Physiatrist and rehab team continue to assess barriers to discharge/monitor patient progress toward functional and medical goals.  Function:  Bathing Bathing position   Position: Wheelchair/chair at sink  Bathing parts Body parts bathed by patient: Right arm, Left arm, Chest, Abdomen, Front perineal area, Buttocks, Right upper leg, Left upper leg, Right lower leg, Left lower leg Body parts bathed by helper: Back  Bathing assist Assist Level: Touching or steadying assistance(Pt > 75%)      Upper Body Dressing/Undressing Upper body dressing   What is the patient wearing?: Pull over shirt/dress, Bra Bra - Perfomed by patient: Thread/unthread right bra strap, Thread/unthread left bra strap, Hook/unhook bra (pull down sports bra) Bra - Perfomed by helper: Hook/unhook bra (pull down sports bra) Pull over shirt/dress - Perfomed by patient: Thread/unthread right sleeve, Thread/unthread left sleeve, Put head through opening, Pull shirt over trunk Pull over shirt/dress - Perfomed by helper: Pull shirt over trunk        Upper body assist Assist Level: Supervision or verbal cues   Set up : To obtain clothing/put away  Lower Body Dressing/Undressing Lower body dressing   What is the patient wearing?: Pants, Non-skid slipper socks Underwear - Performed by patient: Thread/unthread right underwear leg, Thread/unthread left underwear leg Underwear - Performed by helper: Pull underwear up/down Pants-  Performed by patient: Thread/unthread left pants leg, Pull pants up/down, Thread/unthread right pants leg Pants- Performed by helper: Thread/unthread right pants leg Non-skid slipper socks- Performed by patient: Don/doff right sock, Don/doff left sock Non-skid slipper socks- Performed by helper: Don/doff right sock                  Lower body assist Assist for lower body dressing: Touching or  steadying assistance (Pt > 75%)      Toileting Toileting Toileting activity did not occur: No continent bowel/bladder event Toileting steps completed by patient: Adjust clothing prior to toileting, Adjust clothing after toileting, Performs perineal hygiene Toileting steps completed by helper: Performs perineal hygiene    Toileting assist Assist level: Touching or steadying assistance (Pt.75%)   Transfers Chair/bed transfer Chair/bed transfer activity did not occur: Safety/medical concerns Chair/bed transfer method: Stand pivot Chair/bed transfer assist level: Touching or steadying assistance (Pt > 75%) Chair/bed transfer assistive device: Walker, Air cabin crew     Max distance: 150 Assist level: Supervision or verbal cues   Wheelchair   Type: Manual Max wheelchair distance: 150 ft Assist Level: Supervision or verbal cues  Cognition Comprehension Comprehension assist level: Follows basic conversation/direction with no assist  Expression Expression assist level: Expresses basic needs/ideas: With extra time/assistive device  Social Interaction Social Interaction assist level: Interacts appropriately 90% of the time - Needs monitoring or encouragement for participation or interaction.  Problem Solving Problem solving assist level: Solves basic 75 - 89% of the time/requires cueing 10 - 24% of the time  Memory Memory assist level: Recognizes or recalls 50 - 74% of the time/requires cueing 25 - 49% of the time    Medical Problem List and Plan: 1.  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  Cont CIR 2.  DVT Prophylaxis/Anticoagulation: Pharmaceutical: Coumadin per pharmacy  INR subtherapeutic 11/26 3. Pain Management: Hydrocodone prn effective. 4. Mood: LCSW to follow for evaluation and support.  5. Neuropsych: This patient is not fully capable of making decisions on her own  behalf. 6. Skin/Wound Care: Routine pressure relief measures.  7. Fluids/Electrolytes/Nutrition: Strict I/O. Renal panel TTSa with HD.   Diet changed to D2 to help with fatigue  Weaned reglan 11/13, again on 11/14, increased back to 5mg  TID on 11/14 after regurgitation, weaned again 11/20 8. Leucocytosis: ?Resolved  Completed cipro for bronchitis   WBCs 10.0 on 11/24, appreciate CTS follow-up   Labs with HD 10 T2DM:Hgb A1c- 13.3.   Levemir was held due to hypoglycemia, started Levemir 3U qhs on 11/23, increased to 5U 11/26  Remains labile, will make incremental changes due to risk of hypoglycemia   Will cont to monitor CBG (last 3)   Recent Labs  10/28/16 1648 10/28/16 2042 10/29/16 0645  GLUCAP 196* 297* 210*    11. ESRD: Now HD dependent--to schedule HD in afternoons to help with tolerance of therapy during the day. Educate patient and family on renal diet. Electrolyte abnormalities being managed during HD.  12. Anemia of chronic disease:   Hb 10.2 on 11/24  Monitor for signs of bleeding.   On aranesp for erythropoiesis and nulecit for iron load  Labs with HD 13. CAD s/p DES X 2 and MVR: Monitor for symptoms with increase in activity. On plavix and Lipitor.  14. Acute respiratory failure due to acute pulmonary edema:  Fluid overload compensated with HD.  15. AFlutter s/p DCCV/PAF: HR controlled on amiodarone.  On coumadin.  16. Persistent cough with dysphonia, interfering with speech and swallowing:   Off antibiotics.   Repeat CXR 11/12 unremarkable for acute process  Cont nebs  Continue robinul for secretions and Robitussin DM for cough, increased on 11/14  Encourage IS with use of flutter valve  CT of chest shows enlarged PA which likely appeared as potential mass on neck CT. Surgery following along  Respiratory panel neg 11/21  Appreciate CTS/HF evals  Trail of scheduled Nebs, respiratory d/ced  Trial throat spray, pt notes mild improvement  Will consider trail of  thorazine in future 17. Sleep disturbance: managed with benadryl prn  LOS (Days) 18 A FACE TO FACE EVALUATION WAS PERFORMED  Ankit Lorie Phenix 10/29/2016 7:52 AM

## 2016-10-29 NOTE — Progress Notes (Signed)
Physical Therapy Session Note  Patient Details  Name: Deborah [name] MRN: [id number] Date of Birth: [birth year]  Today's Date: 10/29/2016 PT Individual Time: 1245-1330  PT Individual Time Calculation (min): 45 min     Short Term Goals: Week 3:  PT Short Term Goal 1 (Week 3): = LTGs due to anticipated LOS  Skilled Therapeutic Interventions/Progress Updates:    Treatment 1: Patient asleep upon arrival. Patient declined AM therapy session reporting fatigue from early morning HD and not getting any rest last night. Patient reports she will participate in PM therapies. Patient left in bed with daughter [name] present. Will f/u as able.   Treatment 2: Patient in recliner with son [name] present. Patient reported recent episode of emesis and RN present to administer anti nausea medication. Gait training using RW x 85 ft with supervision overall and 2 LOB requiring min A to recover + 70 ft with supervision. Performed NuStep using BUE/BLE at level 3 x total of 5 min and 2 min using BLE only at level 1 with assist to initiate due to BLE weakness with multiple rest breaks as needed due to fatigue and shortness of breath. Discussed with [name] and patient discharge planning, need for photo of stairs for home access, setting chair on landing for rest break, f/u HHPT, and DME needs for transport chair and RW. [name] reports having 7 + 7 steps with L rail ascending to enter apartment. Attempted stair training up/down 2 (6") stairs using L rail only with max A and BLE buckling. Discussed with [name] and daughter [name] via phone recommendation for transport chair to decrease caregiver burden and bumping patient up/down stairs in chair for safety for home access. All verbalized understanding and OT notified to practice bumping wheelchair on stairs with [name] at next session. Patient left in wheelchair, handoff to OT.     Therapy Documentation Precautions:  Precautions Precautions: Fall Restrictions Weight  Bearing Restrictions: No Pain: Pain Assessment Pain Assessment: No/denies pain   See Function Navigator for Current Functional Status.   Therapy/Group: Individual Therapy  Laretta Alstrom 10/29/2016, 2:45 PM

## 2016-10-29 NOTE — Progress Notes (Signed)
ANTICOAGULATION CONSULT NOTE - Follow Up Consult  Pharmacy Consult for coumadin Indication: atrial fibrillation  No Known Allergies  Patient Measurements: Height: 5\' 1"  (154.9 cm) Weight: 123 lb 7.3 oz (56 kg) IBW/kg (Calculated) : 47.8   Vital Signs: Temp: 97.8 F (36.6 C) (11/26 0652) Temp Source: Oral (11/26 0652) BP: 101/47 (11/26 0652) Pulse Rate: 101 (11/26 0652)  Labs:  Recent Labs  10/27/16 0613 10/28/16 0525 10/29/16 1129  HGB 10.2*  --   --   HCT 32.7*  --   --   PLT 309  --   --   LABPROT 22.1* 19.8* 19.6*  INR 1.91 1.66 1.64    Estimated Creatinine Clearance: 7.9 mL/min (by C-G formula based on SCr of 5.78 mg/dL (H)).   Assessment: 60 yo female with afib. Previously supratherapeutic INR likely d/t drug-drug interaction with cipro (11/1-11/5). Amiodarone decreased to 200 mg daily as of 11/22, which could also lead to lower INRs from reduced drug-drug interactions. No bleeding. INR 1.91>1.66>1.64 on 3mg . Will increase dose tonight. Last CBC wnl. No signs/symptoms of bleeding noted.   Goal of Therapy:  INR 2 - 2.5 Monitor platelets by anticoagulation protocol: Yes   Plan:  - Warfarin 4mg  X 1 tonight  - Daily INR - Monitor for s/sx bleeding, CBC, PO intake, DDI, clinical picture.  Dierdre Harness, Cain Sieve, PharmD Clinical Pharmacy Resident [phone] (Pager) 10/29/2016 1:22 PM

## 2016-10-29 NOTE — Progress Notes (Signed)
Physical Therapy Session Note  Patient Details  Name: Deborah [name] MRN: [id number] Date of Birth: 05-23-1956  Today's Date: 10/29/2016 PT Individual Time: 1540-1626 PT Individual Time Calculation (min): 46 min    Short Term Goals: Week 3:  PT Short Term Goal 1 (Week 3): = LTGs due to anticipated LOS  Skilled Therapeutic Interventions/Progress Updates:    Pt seen to make up missed time from this morning.  Pt received in w/c & agreeable to tx, denying c/o pain. Pt's son [name]) present for session. Transported pt to gym via w/c total assist for time management & energy conservation. Pt utilized cybex kinetron in sitting for BLE strengthening purposes. Pt ambulated 45 ft with RW & close supervision<>min assist as pt experienced loss of balance with turning. Pt required rest break 2/2 SOB; SpO2 = 100% on room air, HR = 101 bpm. Pt performed BLE strengthening exercises including long arc quads with 1 lb weight and seated hip adduction pillow squeezes with cuing for proper technique. Pt attempted to perform standing hip/knee flexion but unable to demonstrate proper technique & follow instructions & pt reported her legs were tired. Pt propelled w/c back to room with BUE & supervision, requiring significantly extra time. Pt completed stand pivot w/c>bed with supervision & cuing for hand placement. At end of session pt left in bed with alarm set, all needs within reach & son present.   Therapy Documentation Precautions:  Precautions Precautions: Fall Restrictions Weight Bearing Restrictions: No   See Function Navigator for Current Functional Status.   Therapy/Group: Individual Therapy  [name] 10/29/2016, 5:04 PM

## 2016-10-30 ENCOUNTER — Inpatient Hospital Stay (HOSPITAL_COMMUNITY): Payer: Medicaid Other | Admitting: Speech Pathology

## 2016-10-30 ENCOUNTER — Inpatient Hospital Stay (HOSPITAL_COMMUNITY): Payer: Medicaid Other | Admitting: Physical Therapy

## 2016-10-30 ENCOUNTER — Inpatient Hospital Stay (HOSPITAL_COMMUNITY): Payer: Medicaid Other

## 2016-10-30 ENCOUNTER — Inpatient Hospital Stay (HOSPITAL_COMMUNITY): Payer: Self-pay | Admitting: Occupational Therapy

## 2016-10-30 LAB — CBC
HCT: 33.6 % — ABNORMAL LOW (ref 36.0–46.0)
HEMOGLOBIN: 10.6 g/dL — AB (ref 12.0–15.0)
MCH: 27.5 pg (ref 26.0–34.0)
MCHC: 31.5 g/dL (ref 30.0–36.0)
MCV: 87.3 fL (ref 78.0–100.0)
Platelets: 365 10*3/uL (ref 150–400)
RBC: 3.85 MIL/uL — AB (ref 3.87–5.11)
RDW: 16.7 % — ABNORMAL HIGH (ref 11.5–15.5)
WBC: 13.3 10*3/uL — ABNORMAL HIGH (ref 4.0–10.5)

## 2016-10-30 LAB — URINE MICROSCOPIC-ADD ON: RBC / HPF: NONE SEEN RBC/hpf (ref 0–5)

## 2016-10-30 LAB — URINALYSIS, ROUTINE W REFLEX MICROSCOPIC
GLUCOSE, UA: NEGATIVE mg/dL
Hgb urine dipstick: NEGATIVE
Ketones, ur: NEGATIVE mg/dL
Nitrite: NEGATIVE
PH: 5 (ref 5.0–8.0)
Protein, ur: 30 mg/dL — AB
SPECIFIC GRAVITY, URINE: 1.021 (ref 1.005–1.030)

## 2016-10-30 LAB — GLUCOSE, CAPILLARY
Glucose-Capillary: 267 mg/dL — ABNORMAL HIGH (ref 65–99)
Glucose-Capillary: 273 mg/dL — ABNORMAL HIGH (ref 65–99)
Glucose-Capillary: 188 mg/dL — ABNORMAL HIGH (ref 65–99)
Glucose-Capillary: 214 mg/dL — ABNORMAL HIGH (ref 65–99)

## 2016-10-30 LAB — PROTIME-INR
INR: 1.74
Prothrombin Time: 20.6 seconds — ABNORMAL HIGH (ref 11.4–15.2)

## 2016-10-30 MED ORDER — WARFARIN SODIUM 2 MG PO TABS
4.0000 mg | ORAL_TABLET | Freq: Once | ORAL | Status: AC
  Administered 2016-10-30: 4 mg via ORAL
  Filled 2016-10-30: qty 2

## 2016-10-30 NOTE — Progress Notes (Signed)
Subjective: Interval History: has complaints cough.  Objective: Vital signs in last 24 hours: Temp:  [97.9 F (36.6 C)-98.7 F (37.1 C)] 97.9 F (36.6 C) (11/27 1417) Pulse Rate:  [95-98] 95 (11/27 1417) Resp:  [18-19] 18 (11/27 1417) BP: (125-126)/(62-68) 125/62 (11/27 1417) SpO2:  [98 %-100 %] 100 % (11/27 1417) Weight change:   Intake/Output from previous day: 11/26 0701 - 11/27 0700 In: 470 [P.O.:470] Out: 0  Intake/Output this shift: Total I/O In: 300 [P.O.:300] Out: -   General appearance: alert, cooperative, cachectic and no distress Resp: rales bibasilar and rhonchi bibasilar Chest wall:  PC L IJ Cardio: S1, S2 normal and systolic murmur: holosystolic 2/6, blowing at apex GI: pos bs, liver down 4 cm Extremities: AVG LUA  Lab Results:  Recent Labs  10/30/16 0606  WBC 13.3*  HGB 10.6*  HCT 33.6*  PLT 365   BMET: No results for input(s): NA, K, CL, CO2, GLUCOSE, BUN, CREATININE, CALCIUM in the last 72 hours. No results for input(s): PTH in the last 72 hours. Iron Studies: No results for input(s): IRON, TIBC, TRANSFERRIN, FERRITIN in the last 72 hours.  Studies/Results: Dg Chest 2 View  Result Date: 10/30/2016 CLINICAL DATA:  Cough and leukocytosis EXAM: CHEST  2 VIEW COMPARISON:  Chest radiograph October 15, 2016 and chest CT October 21, 2016 FINDINGS: There is persistent consolidation in the medial bases bilaterally, slightly more although left than on the right. No new parenchymal lung opacity evident. Heart is borderline prominent pulmonary vascularity within normal limits. No adenopathy. Port-A-Cath tip is at the cavoatrial junction. No pneumothorax. There is a mitral valve replacement. No bone lesions. IMPRESSION: Bibasilar consolidation. No new opacity. Stable cardiac silhouette. Electronically Signed   By: Lowella Grip III M.D.   On: 10/30/2016 10:46    I have reviewed the patient's current medications.  Assessment/Plan: 1 ESRD for HD in am 2  MV repair 3 CAD 4 CVA 5 DM controlled 6 Anemia on esa 7 HPTH vit D P HD, esa,      LOS: 19 days   Jeremie Abdelaziz L 10/30/2016,5:49 PM

## 2016-10-30 NOTE — Progress Notes (Signed)
Physical Therapy Session Note  Patient Details  Name: Deborah [name] MRN: [id number] Date of Birth: August 15, 1956  Today's Date: 10/30/2016 PT Individual Time: 1045-1200 PT Individual Time Calculation (min): 75 min    Short Term Goals: Week 3:  PT Short Term Goal 1 (Week 3): = LTGs due to anticipated LOS  Skilled Therapeutic Interventions/Progress Updates:   Pt received seated in bed, denies pain and agreeable to treatment. Supine>sit with S and HOB elevated, bedrails. Gait 1x175', 72x80' with RW and close S with pt fatigued on second gait trial to return to room. Wii balance games in standing with no UE support for focus on ankle strategy, weight shifting. Standing alternating toe taps to 3" step with minA overall, with several LOBs posteriorly requiring min/modA and verbal cues for weight shifting to recover balance. Standing balance on balance foam with ball throws, and on balance wedge to facilitate closed chain dorsiflexion and ankle strategy for posterior LOB recovery. Sit <>stand x10 reps from edge of mat table, no UE support with min guard. Returned to room with gait as above, decreased distance due to fatigue and transported remaining distance totalA in w/c. Educated pt in assisted cough with therapist's hands on abdomen; pt with difficulty performing deep inhalation to prepare for cough however do note improvement in quality of cough when assisted. Stand pivot to return to bed with RW and S. Remained semi-reclined in bed at end of session, all needs in reach.   Therapy Documentation Precautions:  Precautions Precautions: Fall Restrictions Weight Bearing Restrictions: No Pain: Pain Assessment Pain Assessment: No/denies pain Pain Score: 0-No pain   See Function Navigator for Current Functional Status.   Therapy/Group: Individual Therapy  Luberta Mutter 10/30/2016, 12:08 PM

## 2016-10-30 NOTE — Progress Notes (Signed)
ANTICOAGULATION CONSULT NOTE - Follow Up Consult  Pharmacy Consult for coumadin Indication: aflutter  No Known Allergies  Patient Measurements: Height: 5\' 1"  (154.9 cm) Weight: 123 lb 7.3 oz (56 kg) IBW/kg (Calculated) : 47.8 Heparin Dosing Weight:   Vital Signs: Temp: 98.7 F (37.1 C) (11/27 0534) Temp Source: Oral (11/27 0534) BP: 126/68 (11/27 0534) Pulse Rate: 98 (11/27 0534)  Labs:  Recent Labs  10/28/16 0525 10/29/16 1129 10/30/16 0606  HGB  --   --  10.6*  HCT  --   --  33.6*  PLT  --   --  365  LABPROT 19.8* 19.6* 20.6*  INR 1.66 1.64 1.74    Estimated Creatinine Clearance: 7.9 mL/min (by C-G formula based on SCr of 5.78 mg/dL (H)).   Medications:  Scheduled:  . albuterol  2.5 mg Nebulization BID  . amiodarone  200 mg Oral Daily  . atorvastatin  20 mg Oral q1800  . clopidogrel  75 mg Oral Daily  . darbepoetin (ARANESP) injection - DIALYSIS  150 mcg Intravenous Q Fri-HD  . famotidine  20 mg Oral Daily  . feeding supplement  1 Container Oral TID BM  . feeding supplement (PRO-STAT SUGAR FREE 64)  30 mL Oral TID  . fluticasone  2 spray Each Nare Daily  . gabapentin  300 mg Oral QHS  . Gerhardt's butt cream   Topical BID  . guaiFENesin  600 mg Oral Q6H  . hydrocortisone   Rectal BID  . insulin aspart  2-4 Units Subcutaneous TID WC  . insulin detemir  5 Units Subcutaneous QHS  . lanthanum  1,000 mg Oral TID WC  . metoCLOPramide  5 mg Oral BID WC  . midodrine  10 mg Oral q morning - 10a  . multivitamin  1 tablet Oral QHS  . pantoprazole  20 mg Oral BID  . phenol  1 spray Mouth/Throat TID  . sodium chloride flush  10-40 mL Intracatheter Q12H  . Warfarin - Pharmacist Dosing Inpatient   Does not apply q1800   Infusions:    Assessment: 60 yo female with aflutter is currently on subtherapeutic coumadin.  INR is up a little to 1.74.  On amiodarone which may interact with coumadin.   Goal of Therapy:  INR 2-2.5 Monitor platelets by anticoagulation  protocol: Yes   Plan:  - coumadin 4 mg po x1 - INR in am  Keahi Mccarney, Tsz-Yin 10/30/2016,8:31 AM

## 2016-10-30 NOTE — Progress Notes (Signed)
Speech Language Pathology Daily Session Note  Patient Details  Name: Deborah [name] MRN: [id number] Date of Birth: 1956/07/03  Today's Date: 10/30/2016 SLP Individual Time: 0930-1000 SLP Individual Time Calculation (min): 30 min   Short Term Goals: Week 3: SLP Short Term Goal 1 (Week 3): Patient will verbalize vocal hygiene techniques and utilize when given opportunities with supervision verbal cues.   SLP Short Term Goal 2 (Week 3): Patient will demonstrate functional problem solving  for basic and familiar tasks with supervision verbal cues. SLP Short Term Goal 3 (Week 3): Patient will recall new information with supervision verbal cues for use of external aids.  SLP Short Term Goal 4 (Week 3): Patient will demonstrate efficient mastication and oral clearance with Dys. 3 textures overt 2 sessions prior to upgrade with supervision verbal cues.   Skilled Therapeutic Interventions:Skilled therapy intervention focused on cognitive goals. Patient was administered the [facility] Cognitive Assessment (MoCA) 7.2 and scored 23/30 points with 26 points or above considered normal. MoCA previously administered on 10/12/16 and patient scored 21/30 points. Patient exhibited deficits in the subtest on attention, but scored well on the short term memory subtest. Given Mod A verbal cues, patient recalled parameters of water intake for vocal hygeine, also saying that water intake has improved her GI issues. Patient left upright in wheelchair with all needs within reach. Continue current plan of care.    Function:  Cognition Comprehension Comprehension assist level: Follows basic conversation/direction with no assist  Expression   Expression assist level: Expresses basic needs/ideas: With no assist  Social Interaction Social Interaction assist level: Interacts appropriately 90% of the time - Needs monitoring or encouragement for participation or interaction.  Problem Solving Problem solving assist level:  Solves basic 75 - 89% of the time/requires cueing 10 - 24% of the time  Memory Memory assist level: Recognizes or recalls 75 - 89% of the time/requires cueing 10 - 24% of the time    Pain Pain Assessment Pain Assessment: No/denies pain Pain Score: 0-No pain  Therapy/Group: Individual Therapy  Thornton Papas 10/30/2016, 11:48 AM

## 2016-10-30 NOTE — Progress Notes (Addendum)
      [address]       [place] [address]             [phone]         Subjective: Sleepy  Objective: Vital signs in last 24 hours: Temp:  [97.9 F (36.6 C)-98.7 F (37.1 C)] 97.9 F (36.6 C) (11/27 1417) Pulse Rate:  [95-98] 95 (11/27 1417) Resp:  [17-19] 18 (11/27 1417) BP: (90-126)/(53-68) 125/62 (11/27 1417) SpO2:  [97 %-100 %] 100 % (11/27 1417)     Intake/Output from previous day: 11/26 0701 - 11/27 0700 In: 470 [P.O.:470] Out: 0  Intake/Output this shift: Total I/O In: 300 [P.O.:300] Out: -   General appearance: alert, cooperative and no distress Heart: regular rate and rhythm Lungs: clear to auscultation bilaterally Abdomen: soft, non-tender; bowel sounds normal; no masses,  no organomegaly Extremities: extremities normal, atraumatic, no cyanosis or edema Wound: clean and dry  Lab Results:  Recent Labs  10/30/16 0606  WBC 13.3*  HGB 10.6*  HCT 33.6*  PLT 365   BMET: No results for input(s): NA, K, CL, CO2, GLUCOSE, BUN, CREATININE, CALCIUM in the last 72 hours.  PT/INR:  Recent Labs  10/30/16 0606  LABPROT 20.6*  INR 1.74   ABG    Component Value Date/Time   PHART 7.487 (H) 10/04/2016 1430   HCO3 28.4 (H) 10/04/2016 1430   TCO2 26 09/08/2016 0412   ACIDBASEDEF 4.1 (H) 09/02/2016 1850   O2SAT 95.5 10/04/2016 1430   CBG (last 3)   Recent Labs  10/29/16 2142 10/30/16 0626 10/30/16 1211  GLUCAP 268* 267* 273*    Assessment/Plan:  1. Making good progress with PT/OT/speech pathology 2. Cough- Management per primary 3. WBC 13.3 today. Has been mildly elevated since admission.  4. HD per nephrology  5. Coumadin per pharmacy. INR 1.74, remains subtherapeutic.  6. CT of the chest and neck done. No tracheal stenosis. Will ask attending to review.  7. Needs tighter glycemic control, last CBGs 268, 267, 273  Chest xray reviewed. Making good progress.     LOS: 19 days    Elgie Collard 10/30/2016

## 2016-10-30 NOTE — Progress Notes (Signed)
Patient declined in and out cath to collect urine specimen for UA and culture, stated that she felt like she might urinate in a little while. Hat placed in patient's toilet to collect specimen.

## 2016-10-30 NOTE — Progress Notes (Signed)
[facility] PHYSICAL MEDICINE & REHABILITATION     PROGRESS NOTE  Subjective/Complaints:  Pt sitting up in bed this AM.  She slept well overnight.  She just received a breathing treatment and states she feels "a little better".   ROS: +cough.  Denies CP, SOB, NV/D.  Objective: Vital Signs: Blood pressure 126/68, pulse 98, temperature 98.7 F (37.1 C), temperature source Oral, resp. rate 19, height 5\' 1"  (1.549 m), weight 56 kg (123 lb 7.3 oz), SpO2 98 %. No results found.  Recent Labs  10/30/16 0606  WBC 13.3*  HGB 10.6*  HCT 33.6*  PLT 365   No results for input(s): NA, K, CL, GLUCOSE, BUN, CREATININE, CALCIUM in the last 72 hours.  Invalid input(s): CO CBG (last 3)   Recent Labs  10/29/16 1637 10/29/16 2142 10/30/16 0626  GLUCAP 195* 268* 267*    Wt Readings from Last 3 Encounters:  10/29/16 56 kg (123 lb 7.3 oz)  10/11/16 58.1 kg (128 lb 1.4 oz)  08/18/16 71.2 kg (156 lb 15.5 oz)    Physical Exam:  BP 126/68 (BP Location: Right Arm)   Pulse 98   Temp 98.7 F (37.1 C) (Oral)   Resp 19   Ht 5\' 1"  (1.549 m)   Wt 56 kg (123 lb 7.3 oz)   SpO2 98%   BMI 23.33 kg/m  Constitutional:  She appears well-developed and well-nourished. NAD. Vital signs reviewed.  HENT: [place], AT Eyes: EOM are normal. No discharge.  Cardiovascular: RRR. No JVD. Respiratory: Clear to auscultation. Unlabored. GI: Soft, NT.  BS+.  Musculoskeletal: She exhibits no tenderness. She exhibits no edema.  Neurological: She is alert and oriented.  Dysphonia (unchanged) Motor: 4+/5 proximal to distal bilateral UE/LE (unchanged) Skin: Skin is warm and dry. She is not diaphoretic. No erythema.  Incision healed  Psychiatric: Her affect is flat. Normal behavior.   Assessment/Plan: 1. Functional deficits secondary to debility from multiply cardial and pulmonary issues which require 3+ hours per day of interdisciplinary therapy in a comprehensive inpatient rehab setting. Physiatrist is providing  close team supervision and 24 hour management of active medical problems listed below. Physiatrist and rehab team continue to assess barriers to discharge/monitor patient progress toward functional and medical goals.  Function:  Bathing Bathing position   Position: Wheelchair/chair at sink  Bathing parts Body parts bathed by patient: Right arm, Left arm, Chest, Abdomen, Front perineal area, Buttocks, Right upper leg, Left upper leg, Right lower leg, Left lower leg Body parts bathed by helper: Back  Bathing assist Assist Level: Touching or steadying assistance(Pt > 75%)      Upper Body Dressing/Undressing Upper body dressing   What is the patient wearing?: Pull over shirt/dress, Bra Bra - Perfomed by patient: Thread/unthread right bra strap, Thread/unthread left bra strap, Hook/unhook bra (pull down sports bra) Bra - Perfomed by helper: Hook/unhook bra (pull down sports bra) Pull over shirt/dress - Perfomed by patient: Thread/unthread right sleeve, Thread/unthread left sleeve, Put head through opening, Pull shirt over trunk Pull over shirt/dress - Perfomed by helper: Pull shirt over trunk        Upper body assist Assist Level: Supervision or verbal cues   Set up : To obtain clothing/put away  Lower Body Dressing/Undressing Lower body dressing   What is the patient wearing?: Pants, Non-skid slipper socks Underwear - Performed by patient: Thread/unthread right underwear leg, Thread/unthread left underwear leg, Pull underwear up/down Underwear - Performed by helper: Pull underwear up/down Pants- Performed by patient: Thread/unthread  left pants leg, Pull pants up/down, Thread/unthread right pants leg Pants- Performed by helper: Thread/unthread right pants leg Non-skid slipper socks- Performed by patient: Don/doff right sock, Don/doff left sock Non-skid slipper socks- Performed by helper: Don/doff right sock                  Lower body assist Assist for lower body dressing:  Touching or steadying assistance (Pt > 75%)      Toileting Toileting Toileting activity did not occur: No continent bowel/bladder event Toileting steps completed by patient: Adjust clothing prior to toileting, Adjust clothing after toileting, Performs perineal hygiene Toileting steps completed by helper: Performs perineal hygiene    Toileting assist Assist level: Touching or steadying assistance (Pt.75%)   Transfers Chair/bed transfer Chair/bed transfer activity did not occur: Safety/medical concerns Chair/bed transfer method: Stand pivot Chair/bed transfer assist level: Touching or steadying assistance (Pt > 75%) Chair/bed transfer assistive device: Walker, Air cabin crew     Max distance: 45 ft Assist level: Touching or steadying assistance (Pt > 75%)   Wheelchair   Type: Manual Max wheelchair distance: 150 ft Assist Level: Supervision or verbal cues  Cognition Comprehension Comprehension assist level: Follows basic conversation/direction with no assist  Expression Expression assist level: Expresses basic needs/ideas: With extra time/assistive device  Social Interaction Social Interaction assist level: Interacts appropriately 90% of the time - Needs monitoring or encouragement for participation or interaction.  Problem Solving Problem solving assist level: Solves basic 75 - 89% of the time/requires cueing 10 - 24% of the time  Memory Memory assist level: Recognizes or recalls 50 - 74% of the time/requires cueing 25 - 49% of the time    Medical Problem List and Plan: 1.  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  Cont CIR 2.  DVT Prophylaxis/Anticoagulation: Pharmaceutical: Coumadin per pharmacy  INR subtherapeutic 11/27 3. Pain Management: Hydrocodone prn effective. 4. Mood: LCSW to follow for evaluation and support.  5. Neuropsych: This patient is not fully capable of  making decisions on her own behalf. 6. Skin/Wound Care: Routine pressure relief measures.  7. Fluids/Electrolytes/Nutrition: Strict I/O. Renal panel TTSa with HD.   Diet changed to D2 to help with fatigue  Weaned reglan 11/13, again on 11/14, increased back to 5mg  TID on 11/14 after regurgitation, weaned again 11/20 8. Leucocytosis: ?Resolved  Completed cipro for bronchitis   WBCs 10.0 on 11/24, appreciate CTS follow-up   Labs with HD 10 T2DM:Hgb A1c- 13.3.   Levemir was held due to hypoglycemia, started Levemir 3U qhs on 11/23, increased to 5U 11/26  Extremely labile due to intake yesterday and insulin doses held  Will cont to monitor and make incremental changes due to risk of hypoglycemia   Will cont to monitor CBG (last 3)   Recent Labs  10/29/16 1637 10/29/16 2142 10/30/16 0626  GLUCAP 195* 268* 267*    11. ESRD: Now HD dependent--to schedule HD in afternoons to help with tolerance of therapy during the day. Educate patient and family on renal diet. Electrolyte abnormalities being managed during HD.  12. Anemia of chronic disease:   Hb 10.6 on 11/27  Monitor for signs of bleeding.   On aranesp for erythropoiesis and nulecit for iron load  Labs with HD 13. CAD s/p DES X 2 and MVR: Monitor for symptoms with increase in activity. On plavix and Lipitor.  14. Acute respiratory failure due to acute pulmonary edema:  Fluid  overload compensated with HD.  15. AFlutter s/p DCCV/PAF: HR controlled on amiodarone. On coumadin.  16. Persistent cough with dysphonia, interfering with speech and swallowing:   Off antibiotics.   Repeat CXR 11/12 unremarkable for acute process  Cont nebs  Continue robinul for secretions and Robitussin DM for cough, increased on 11/14  Encourage IS with use of flutter valve  CT of chest shows enlarged PA which likely appeared as potential mass on neck CT. Surgery following along  Respiratory panel neg 11/21  Appreciate CTS/HF evals  Trail of scheduled  Nebs, respiratory d/ced  Trial throat spray, pt notes mild improvement  Will consider trail of thorazine in future 17. Sleep disturbance: managed with benadryl prn 18. Leukocytosis  Will order Urine  Will order CXR  LOS (Days) 19 A FACE TO FACE EVALUATION WAS PERFORMED  Marlys Stegmaier Lorie Phenix 10/30/2016 8:30 AM

## 2016-10-30 NOTE — Progress Notes (Signed)
Occupational Therapy Session Note  Patient Details  Name: Deborah [name] MRN: [id number] Date of Birth: 04-09-1956  Today's Date: 10/30/2016 OT Individual Time: 0800-0900 OT Individual Time Calculation (min): 60 min   Short Term Goals:Week 3:  OT Short Term Goal 1 (Week 3): STGs=LTGs due to ELOS  Skilled Therapeutic Interventions/Progress Updates:    Pt greeted semi-reclined in bed finishing  Breakfast with nurse tech present. OT provided supervision during eating, pt took appropriate sized bites and was able to self-feed with supervision. Pt then ambulated to closet w/ RW and close supervision to collect clothing. Pt required verbal cues for safety awareness and safe technique to obtain clothing using Rw. Cues to initiate bathing task, then ablet to sequence self-care activity with supervision. Improved safety awareness demonstrated by properly managing breaks prior to sit<>stands with w/c. Pt tolerated standing at the sink for toothbrushing task with supervision for dynamic standing balance. Pt left seated in wc at end of session with needs met.   Therapy Documentation Precautions:  Precautions Precautions: Fall Restrictions Weight Bearing Restrictions: No Pain: Pain Assessment Pain Assessment: No/denies pain Pain Score: 0-No pain ADL: ADL ADL Comments: Please see functional navigator  See Function Navigator for Current Functional Status.   Therapy/Group: Individual Therapy  Valma Cava 10/30/2016, 8:24 AM

## 2016-10-31 ENCOUNTER — Inpatient Hospital Stay (HOSPITAL_COMMUNITY): Payer: Medicaid Other | Admitting: Occupational Therapy

## 2016-10-31 ENCOUNTER — Inpatient Hospital Stay (HOSPITAL_COMMUNITY): Payer: Medicaid Other | Admitting: Speech Pathology

## 2016-10-31 ENCOUNTER — Inpatient Hospital Stay (HOSPITAL_COMMUNITY): Payer: Medicaid Other | Admitting: Physical Therapy

## 2016-10-31 DIAGNOSIS — N390 Urinary tract infection, site not specified: Secondary | ICD-10-CM

## 2016-10-31 LAB — RENAL FUNCTION PANEL
Albumin: 2.5 g/dL — ABNORMAL LOW (ref 3.5–5.0)
Anion gap: 13 (ref 5–15)
BUN: 48 mg/dL — ABNORMAL HIGH (ref 6–20)
CHLORIDE: 93 mmol/L — AB (ref 101–111)
CO2: 24 mmol/L (ref 22–32)
Calcium: 8.8 mg/dL — ABNORMAL LOW (ref 8.9–10.3)
Creatinine, Ser: 5.47 mg/dL — ABNORMAL HIGH (ref 0.44–1.00)
GFR, EST AFRICAN AMERICAN: 9 mL/min — AB (ref >60)
GFR, EST NON AFRICAN AMERICAN: 8 mL/min — AB (ref >60)
Glucose, Bld: 345 mg/dL — ABNORMAL HIGH (ref 65–99)
PHOSPHORUS: 2.5 mg/dL (ref 2.5–4.6)
POTASSIUM: 3.1 mmol/L — AB (ref 3.5–5.1)
Sodium: 130 mmol/L — ABNORMAL LOW (ref 135–145)

## 2016-10-31 LAB — CBC
HEMATOCRIT: 31.1 % — AB (ref 36.0–46.0)
HEMOGLOBIN: 9.6 g/dL — AB (ref 12.0–15.0)
MCH: 26.7 pg (ref 26.0–34.0)
MCHC: 30.9 g/dL (ref 30.0–36.0)
MCV: 86.4 fL (ref 78.0–100.0)
Platelets: 463 10*3/uL — ABNORMAL HIGH (ref 150–400)
RBC: 3.6 MIL/uL — AB (ref 3.87–5.11)
RDW: 16.4 % — ABNORMAL HIGH (ref 11.5–15.5)
WBC: 10.3 10*3/uL (ref 4.0–10.5)

## 2016-10-31 LAB — GLUCOSE, CAPILLARY
GLUCOSE-CAPILLARY: 142 mg/dL — AB (ref 65–99)
Glucose-Capillary: 249 mg/dL — ABNORMAL HIGH (ref 65–99)
Glucose-Capillary: 218 mg/dL — ABNORMAL HIGH (ref 65–99)

## 2016-10-31 LAB — PROTIME-INR
INR: 2.52
Prothrombin Time: 27.6 seconds — ABNORMAL HIGH (ref 11.4–15.2)

## 2016-10-31 MED ORDER — CEFDINIR 125 MG/5ML PO SUSR
300.0000 mg | ORAL | Status: DC
  Administered 2016-10-31: 300 mg via ORAL
  Filled 2016-10-31: qty 15

## 2016-10-31 MED ORDER — INSULIN DETEMIR 100 UNIT/ML ~~LOC~~ SOLN
8.0000 [IU] | Freq: Every day | SUBCUTANEOUS | Status: DC
  Administered 2016-10-31: 8 [IU] via SUBCUTANEOUS
  Filled 2016-10-31 (×3): qty 0.08

## 2016-10-31 MED ORDER — PANTOPRAZOLE SODIUM 40 MG PO TBEC
40.0000 mg | DELAYED_RELEASE_TABLET | Freq: Two times a day (BID) | ORAL | Status: DC
  Administered 2016-10-31 – 2016-11-01 (×2): 40 mg via ORAL
  Filled 2016-10-31 (×2): qty 1

## 2016-10-31 MED ORDER — WARFARIN SODIUM 2 MG PO TABS
2.0000 mg | ORAL_TABLET | Freq: Once | ORAL | Status: AC
  Administered 2016-10-31: 2 mg via ORAL
  Filled 2016-10-31: qty 1

## 2016-10-31 NOTE — Progress Notes (Signed)
Subjective: Interval History: has no complaint.  Objective: Vital signs in last 24 hours: Temp:  [97.7 F (36.5 C)-97.9 F (36.6 C)] 97.7 F (36.5 C) (11/28 0453) Pulse Rate:  [95-99] 99 (11/28 0453) Resp:  [18-20] 20 (11/28 0453) BP: (125-148)/(62-75) 148/75 (11/28 0453) SpO2:  [98 %-100 %] 99 % (11/28 0453) Weight change:   Intake/Output from previous day: 11/27 0701 - 11/28 0700 In: 540 [P.O.:540] Out: -  Intake/Output this shift: Total I/O In: 120 [P.O.:120] Out: -   General appearance: alert, cooperative, cachectic and no distress Resp: rales bibasilar and rhonchi bibasilar Chest wall: L IJ PC Cardio: S1, S2 normal and systolic murmur: holosystolic 2/6, blowing at apex GI: pos bs, liver down 4 cm Extremities: edema 1+ and AVG LUA B&T  Lab Results:  Recent Labs  10/30/16 0606  WBC 13.3*  HGB 10.6*  HCT 33.6*  PLT 365   BMET: No results for input(s): NA, K, CL, CO2, GLUCOSE, BUN, CREATININE, CALCIUM in the last 72 hours. No results for input(s): PTH in the last 72 hours. Iron Studies: No results for input(s): IRON, TIBC, TRANSFERRIN, FERRITIN in the last 72 hours.  Studies/Results: Dg Chest 2 View  Result Date: 10/30/2016 CLINICAL DATA:  Cough and leukocytosis EXAM: CHEST  2 VIEW COMPARISON:  Chest radiograph October 15, 2016 and chest CT October 21, 2016 FINDINGS: There is persistent consolidation in the medial bases bilaterally, slightly more although left than on the right. No new parenchymal lung opacity evident. Heart is borderline prominent pulmonary vascularity within normal limits. No adenopathy. Port-A-Cath tip is at the cavoatrial junction. No pneumothorax. There is a mitral valve replacement. No bone lesions. IMPRESSION: Bibasilar consolidation. No new opacity. Stable cardiac silhouette. Electronically Signed   By: Lowella Grip III M.D.   On: 10/30/2016 10:46    I have reviewed the patient's current medications.  Assessment/Plan: 1 ESRD for  HD, mild vol xs 2 Atelectasis  3 Anemia check CBC 4 HPTH vit D 5 DM ^ BS 6 Afib in NSR 7 CAD 8 MV repair P Hd, esa, vit D, glucose control.      LOS: 20 days   Dorraine Ellender L 10/31/2016,9:43 AM

## 2016-10-31 NOTE — Procedures (Signed)
I was present at this session.  I have reviewed the session itself and made appropriate changes.  Bp via PC, bp drop early, keep even.  Deborah [name] L 11/28/20175:38 PM

## 2016-10-31 NOTE — Progress Notes (Signed)
[facility]     PROGRESS NOTE  Subjective/Complaints:  Pt seen sitting up in bed.  She states she had trouble sleeping last night due to cough.    ROS: +cough.  Denies CP, SOB, NV/D.  Objective: Vital Signs: Blood pressure (!) 148/75, pulse 99, temperature 97.7 F (36.5 C), temperature source Oral, resp. rate 20, height 5\' 1"  (1.549 m), weight 56 kg (123 lb 7.3 oz), SpO2 99 %. Dg Chest 2 View  Result Date: 10/30/2016 CLINICAL DATA:  Cough and leukocytosis EXAM: CHEST  2 VIEW COMPARISON:  Chest radiograph October 15, 2016 and chest CT October 21, 2016 FINDINGS: There is persistent consolidation in the medial bases bilaterally, slightly more although left than on the right. No new parenchymal lung opacity evident. Heart is borderline prominent pulmonary vascularity within normal limits. No adenopathy. Port-A-Cath tip is at the cavoatrial junction. No pneumothorax. There is a mitral valve replacement. No bone lesions. IMPRESSION: Bibasilar consolidation. No new opacity. Stable cardiac silhouette. Electronically Signed   By: Lowella Grip III M.D.   On: 10/30/2016 10:46    Recent Labs  10/30/16 0606  WBC 13.3*  HGB 10.6*  HCT 33.6*  PLT 365   No results for input(s): NA, K, CL, GLUCOSE, BUN, CREATININE, CALCIUM in the last 72 hours.  Invalid input(s): CO CBG (last 3)   Recent Labs  10/30/16 1638 10/30/16 2104 10/31/16 0637  GLUCAP 188* 214* 249*    Wt Readings from Last 3 Encounters:  10/29/16 56 kg (123 lb 7.3 oz)  10/11/16 58.1 kg (128 lb 1.4 oz)  08/18/16 71.2 kg (156 lb 15.5 oz)    Physical Exam:  BP (!) 148/75 (BP Location: Right Arm)   Pulse 99   Temp 97.7 F (36.5 C) (Oral)   Resp 20   Ht 5\' 1"  (1.549 m)   Wt 56 kg (123 lb 7.3 oz)   SpO2 99%   BMI 23.33 kg/m  Constitutional:  She appears well-developed and well-nourished. NAD. Vital signs reviewed.  HENT: [place], AT Eyes: EOM are normal. No discharge.  Cardiovascular:  RRR. No JVD. Respiratory: Clear to auscultation. Unlabored. GI: Soft, NT.  BS+.  Musculoskeletal: She exhibits no tenderness. She exhibits no edema.  Neurological: She is alert and oriented.  Dysphonia (stable) Motor: 4+/5 proximal to distal bilateral UE/LE (unchanged) Skin: Skin is warm and dry. She is not diaphoretic. No erythema.  Incision healed  Psychiatric: Her affect is flat. Normal behavior.   Assessment/Plan: 1. Functional deficits secondary to debility from multiply cardial and pulmonary issues which require 3+ hours per day of interdisciplinary therapy in a comprehensive inpatient rehab setting. Physiatrist is providing close team supervision and 24 hour management of active medical problems listed below. Physiatrist and rehab team continue to assess barriers to discharge/monitor patient progress toward functional and medical goals.  Function:  Bathing Bathing position   Position: Wheelchair/chair at sink  Bathing parts Body parts bathed by patient: Right arm, Left arm, Chest, Abdomen, Front perineal area, Buttocks, Right upper leg, Left upper leg, Right lower leg, Left lower leg Body parts bathed by helper: Back  Bathing assist Assist Level: Touching or steadying assistance(Pt > 75%)      Upper Body Dressing/Undressing Upper body dressing   What is the patient wearing?: Pull over shirt/dress, Bra Bra - Perfomed by patient: Thread/unthread right bra strap, Thread/unthread left bra strap, Hook/unhook bra (pull down sports bra) Bra - Perfomed by helper: Hook/unhook bra (pull down sports bra) Pull  over shirt/dress - Perfomed by patient: Thread/unthread right sleeve, Thread/unthread left sleeve, Put head through opening, Pull shirt over trunk Pull over shirt/dress - Perfomed by helper: Pull shirt over trunk        Upper body assist Assist Level: Supervision or verbal cues   Set up : To obtain clothing/put away  Lower Body Dressing/Undressing Lower body dressing   What  is the patient wearing?: Pants, Non-skid slipper socks Underwear - Performed by patient: Thread/unthread right underwear leg, Thread/unthread left underwear leg, Pull underwear up/down Underwear - Performed by helper: Pull underwear up/down Pants- Performed by patient: Thread/unthread left pants leg, Pull pants up/down, Thread/unthread right pants leg Pants- Performed by helper: Thread/unthread right pants leg Non-skid slipper socks- Performed by patient: Don/doff right sock, Don/doff left sock Non-skid slipper socks- Performed by helper: Don/doff right sock                  Lower body assist Assist for lower body dressing: Touching or steadying assistance (Pt > 75%)      Toileting Toileting Toileting activity did not occur: No continent bowel/bladder event Toileting steps completed by patient: Adjust clothing prior to toileting, Performs perineal hygiene Toileting steps completed by helper: Adjust clothing after toileting Toileting Assistive Devices: Grab bar or rail  Toileting assist Assist level: Touching or steadying assistance (Pt.75%)   Transfers Chair/bed transfer Chair/bed transfer activity did not occur: Safety/medical concerns Chair/bed transfer method: Stand pivot Chair/bed transfer assist level: Supervision or verbal cues Chair/bed transfer assistive device: Environmental consultant, Air cabin crew     Max distance: 175 Assist level: Supervision or verbal cues   Wheelchair   Type: Manual Max wheelchair distance: 150 ft Assist Level: Supervision or verbal cues  Cognition Comprehension Comprehension assist level: Follows basic conversation/direction with no assist  Expression Expression assist level: Expresses basic needs/ideas: With no assist  Social Interaction Social Interaction assist level: Interacts appropriately 90% of the time - Needs monitoring or encouragement for participation or interaction.  Problem Solving Problem solving assist level: Solves  basic 75 - 89% of the time/requires cueing 10 - 24% of the time  Memory Memory assist level: Recognizes or recalls 75 - 89% of the time/requires cueing 10 - 24% of the time    Medical Problem List and Plan: 1.  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  Cont CIR 2.  DVT Prophylaxis/Anticoagulation: Pharmaceutical: Coumadin per pharmacy  INR therapeutic 11/28 3. Pain Management: Hydrocodone prn effective.  Gabapentin d/ced 11/28 ?cough 4. Mood: LCSW to follow for evaluation and support.  5. Neuropsych: This patient is not fully capable of making decisions on her own behalf. 6. Skin/Wound Care: Routine pressure relief measures.  7. Fluids/Electrolytes/Nutrition: Strict I/O. Renal panel TTSa with HD.   Diet changed to D2 to help with fatigue  Weaned reglan 11/13, again on 11/14, increased back to 5mg  TID on 11/14 after regurgitation, weaned again 11/20, will need to wean further at outpt 8. Leucocytosis: ?Resolved  Completed cipro for bronchitis   WBCs 10.0 on 11/24, appreciate CTS follow-up   Labs with HD 10 T2DM:Hgb A1c- 13.3.   Levemir was held due to hypoglycemia, started Levemir 3U qhs on 11/23, increased to 5U 11/26, increased to [place] 11/28  Labile, but trending up  Will cont to monitor and make incremental changes due to risk of hypoglycemia   Will cont to monitor CBG (last 3)   Recent Labs  10/30/16 1638  10/30/16 2104 10/31/16 0637  GLUCAP 188* 214* 249*    11. ESRD: Now HD dependent--to schedule HD in afternoons to help with tolerance of therapy during the day. Educate patient and family on renal diet. Electrolyte abnormalities being managed during HD.  12. Anemia of chronic disease:   Hb 10.6 on 11/27  Monitor for signs of bleeding.   On aranesp for erythropoiesis and nulecit for iron load  Labs with HD 13. CAD s/p DES X 2 and MVR: Monitor for symptoms with increase in activity. On plavix  and Lipitor.  14. Acute respiratory failure due to acute pulmonary edema:  Fluid overload compensated with HD.  15. AFlutter s/p DCCV/PAF: HR controlled on amiodarone. On coumadin.  16. Persistent cough with dysphonia, interfering with speech and swallowing:   Off antibiotics.   Repeat CXR 11/12 unremarkable for acute process, repeat CXR 11/27 reviewed, stable  Cont nebs  Continue robinul for secretions and Robitussin DM for cough, increased on 11/14  Encourage IS with use of flutter valve  CT of chest shows enlarged PA which likely appeared as potential mass on neck CT. Surgery following along  Respiratory panel neg 11/21  Appreciate CTS/HF evals  Trail of scheduled Nebs, respiratory d/ced  Trial throat spray, pt notes mild improvement  PPI increased 11/28  Will consider trail of thorazine in future 17. Sleep disturbance: managed with benadryl prn 18. Leukocytosis  UA mildly positive, Ucx pending  CXR unremarkable for acute process  Empiric cipro started  LOS (Days) 20 A FACE TO FACE EVALUATION WAS PERFORMED  Ankit Lorie Phenix 10/31/2016 8:22 AM

## 2016-10-31 NOTE — Progress Notes (Signed)
ANTICOAGULATION CONSULT NOTE - Follow Up Consult  Pharmacy Consult for coumadin Indication: aflutter  No Known Allergies  Patient Measurements: Height: 5\' 1"  (154.9 cm) Weight: 123 lb 7.3 oz (56 kg) IBW/kg (Calculated) : 47.8 Heparin Dosing Weight:   Vital Signs: Temp: 97.7 F (36.5 C) (11/28 0453) Temp Source: Oral (11/28 0453) BP: 148/75 (11/28 0453) Pulse Rate: 99 (11/28 0453)  Labs:  Recent Labs  10/29/16 1129 10/30/16 0606 10/31/16 0647  HGB  --  10.6*  --   HCT  --  33.6*  --   PLT  --  365  --   LABPROT 19.6* 20.6* 27.6*  INR 1.64 1.74 2.52    Estimated Creatinine Clearance: 7.9 mL/min (by C-G formula based on SCr of 5.78 mg/dL (H)).   Medications:  Scheduled:  . amiodarone  200 mg Oral Daily  . atorvastatin  20 mg Oral q1800  . clopidogrel  75 mg Oral Daily  . darbepoetin (ARANESP) injection - DIALYSIS  150 mcg Intravenous Q Fri-HD  . feeding supplement  1 Container Oral TID BM  . feeding supplement (PRO-STAT SUGAR FREE 64)  30 mL Oral TID  . fluticasone  2 spray Each Nare Daily  . Gerhardt's butt cream   Topical BID  . guaiFENesin  600 mg Oral Q6H  . hydrocortisone   Rectal BID  . insulin aspart  2-4 Units Subcutaneous TID WC  . insulin detemir  5 Units Subcutaneous QHS  . lanthanum  1,000 mg Oral TID WC  . metoCLOPramide  5 mg Oral BID WC  . midodrine  10 mg Oral q morning - 10a  . multivitamin  1 tablet Oral QHS  . pantoprazole  40 mg Oral BID  . phenol  1 spray Mouth/Throat TID  . sodium chloride flush  10-40 mL Intracatheter Q12H  . Warfarin - Pharmacist Dosing Inpatient   Does not apply q1800   Infusions:    Assessment: 60 yo female with aflutter is currently on slightly supratherapeutic coumadin but INR jumped from 1.74 to 2.52 after 2 days of coumadin 4 mg po.  On amiodarone.  Goal of Therapy:  INR 2-2.5 Monitor platelets by anticoagulation protocol: Yes   Plan:  - coumadin 2 mg po x1 - INR in am  Deborah [name], Deborah [name] 10/31/2016,8:30  AM

## 2016-10-31 NOTE — Progress Notes (Signed)
Social Work  Discharge Note  The overall goal for the admission was met for:   Discharge location: Yes-HOME WITH SON AND DAUGHTER'S TO BE ASSISTING-TEAM RECOMMENDS 24 HR SUPERVISION  Length of Stay: Yes-21 DAYS  Discharge activity level: Yes-SUPERVISION-MIN LEVEL  Home/community participation: Yes  Services provided included: MD, RD, PT, OT, SLP, RN, CM, Pharmacy and SW  Financial Services: Other: PENDING MEDICAID AND MEDICARE  Follow-up services arranged: Home Health: [facility]-ELIGIBLE FOR RN ONLY UNTIL MEDICARE APPROVED, DME: [facility]-TRANSPORT CHAIR, ROLLING WALKER, BEDSIDE COMMODE , TUB BENCH AND NEBULIZER MACHINE and Patient/Family has no preference for HH/DME agencies  Comments (or additional information):AWARE NOT ELIGIBLE FOR FOLLOW UP THERAPIES DUE TO NO QUALIFYING DIAGNOSIS. CAN GET ONCE MEDICARE APPROVED. GAVE HD SCHEDULE T, TH & SAT 12:00 SLOT AT [facility]. FIRST APPOINTMENT TO BE THERE AT 11:15. RECOMMENDATION FOR FAMILY MEMBER TO TAKE HER FOR THE FIRST COUPLE OF WEEKS BEFORE GETTING TRANSPORT DUE TO SEE HOW PT DOES WITH OP HD. PT WILL GET SISTER OR DAD TO TAKE HER. TEAM RECOMMENDS 24 HR SUPERVISION LEVEL UNSURE IF FAMILY WILL BE PROVIDING THIS, MADE AWARE AND SAW PT IN THERAPIES. MATCH PROGRAM GIVEN TO PT FOR MEDICATION ASSISTANCE, NEEDS TO FOLLOW UP WITH [facility] WILL NEED TO DO ANOTHER ELIGIBILITY DUE TO HAS BEEN TOO LONG SINCE HAS BEEN THERE.   Patient/Family verbalized understanding of follow-up arrangements: Yes  Individual responsible for coordination of the follow-up plan: TERESA-DAUGHTER, PT AND FRANCO-SON  Confirmed correct DME delivered: Elease Hashimoto 10/31/2016    Elease Hashimoto

## 2016-10-31 NOTE — Progress Notes (Addendum)
Social Work Patient ID: Deborah [name], female   DOB: Apr 15, 1956, 60 y.o.   MRN: [id number]  Met with pt and spoke with daughter-Teresa to discuss discharge tomorrow. Pt's equipment is in her room and only Follow up she is eligible for is a HHRN until her Medicare is approved. Have spoken with HD in [facility] regarding pt needing transportation and the first two weeks it would be good for her a family member to Take her and see how she does. Pt to talk with her sister and father about this, her son was suppose to be taking off two weeks from work unsure if he is going to be doing this. Her medicaid and disability are still pending Family will need to follow up on this. Gave pt information regarding her HD place, time and location. Have added a nebulizer machine to the equipment and should be delivered tomorrow prior to discharge. Pt felt all Questions were answered and daughter will be picking her up tomorrow after work. Daughter wants pt to be in HD M,W,F have informed her to let the HD site where Mom is going her preference and they can try to accommodate Them if a slot is available. Also aware will need to go through the eligibility process of the [facility] to be able to access their services, pt made aware of this. It had been too long since she went there. When insurance approved can get primary care MD.

## 2016-10-31 NOTE — Progress Notes (Signed)
Physical Therapy Discharge Summary  Patient Details  Name: Deborah [name] MRN: [id number] Date of Birth: [birth year]  Today's Date: 10/31/2016   Patient has met 7 of 8 long term goals due to improved activity tolerance, improved balance, improved postural control, increased strength, decreased pain, ability to compensate for deficits, functional use of  right upper extremity, right lower extremity, left upper extremity and left lower extremity, improved attention, improved awareness and improved coordination.  Patient to discharge at household ambulatory level Supervision.   Patient's care partner is independent to provide the necessary physical and cognitive assistance at discharge.  Reasons goals not met: Patient can require up to min A for dynamic standing balance due to weakness and impaired balance strategies.   Recommendation:  Patient will benefit from ongoing skilled PT services in home health setting to continue to advance safe functional mobility, address ongoing impairments in strength, balance, activity tolerance, and minimize fall risk.  Equipment: transport chair, RW  Reasons for discharge: treatment goals met and discharge from hospital  Patient/family agrees with progress made and goals achieved: Yes  PT Discharge Precautions/RestrictionsPrecautions Precautions: Fall Restrictions Weight Bearing Restrictions: No Pain Pain Assessment Pain Assessment: No/denies pain Vision/Perception   No change from baseline  Cognition Overall Cognitive Status: Impaired/Different from baseline Arousal/Alertness: Awake/alert Orientation Level: Oriented X4 Attention: Selective Selective Attention: Impaired Selective Attention Impairment: Functional basic Memory: Impaired Memory Impairment: Retrieval deficit;Decreased recall of new information;Storage deficit Awareness: Impaired Awareness Impairment: Anticipatory impairment Problem Solving: Impaired Problem Solving Impairment:  Functional basic Safety/Judgment: Impaired Sensation Sensation Light Touch: Appears Intact Hot/Cold: Appears Intact Proprioception: Appears Intact Coordination Gross Motor Movements are Fluid and Coordinated: No Fine Motor Movements are Fluid and Coordinated: No Motor  Motor Motor: Within Functional Limits Motor - Skilled Clinical Observations: deconditioned and generalized weakness  Mobility Bed Mobility Bed Mobility: Supine to Sit;Sit to Supine Supine to Sit: 6: Modified independent (Device/Increase time);HOB flat Sit to Supine: 6: Modified independent (Device/Increase time);HOB flat Transfers Transfers: Yes Stand Pivot Transfers: 5: Supervision;With armrests Locomotion  Ambulation Ambulation: Yes Ambulation/Gait Assistance: 5: Supervision Ambulation Distance (Feet): 175 Feet Assistive device: Rolling walker Gait Gait: Yes Gait Pattern: Impaired Gait Pattern: Step-through pattern;Poor foot clearance - right;Trunk flexed;Decreased trunk rotation;Left flexed knee in stance;Right flexed knee in stance Stairs / Additional Locomotion Stairs: Yes Stairs Assistance: 5: Supervision Stair Management Technique: Two rails;Step to pattern;Forwards Number of Stairs: 8 Height of Stairs: 3 Wheelchair Mobility Wheelchair Mobility: No  Trunk/Postural Assessment  Cervical Assessment Cervical Assessment: Within Functional Limits Thoracic Assessment Thoracic Assessment: Within Functional Limits Lumbar Assessment Lumbar Assessment: Within Functional Limits Postural Control Postural Control: Deficits on evaluation Protective Responses: impaired/insufficient  Balance Balance Balance Assessed: Yes Standardized Balance Assessment Standardized Balance Assessment: Berg Balance Test Berg Balance Test Sit to Stand: Able to stand  independently using hands Standing Unsupported: Able to stand 2 minutes with supervision Sitting with Back Unsupported but Feet Supported on Floor or Stool:  Able to sit safely and securely 2 minutes Stand to Sit: Controls descent by using hands Transfers: Able to transfer with verbal cueing and /or supervision Standing Unsupported with Eyes Closed: Able to stand 10 seconds with supervision Standing Ubsupported with Feet Together: Needs help to attain position and unable to hold for 15 seconds From Standing, Reach Forward with Outstretched Arm: Reaches forward but needs supervision From Standing Position, Pick up Object from Floor: Unable to try/needs assist to keep balance From Standing Position, Turn to Look Behind Over each Shoulder: Looks behind from both sides  and weight shifts well Turn 360 Degrees: Needs assistance while turning Standing Unsupported, Alternately Place Feet on Step/Stool: Needs assistance to keep from falling or unable to try Standing Unsupported, One Foot in Front: Needs help to step but can hold 15 seconds Standing on One Leg: Tries to lift leg/unable to hold 3 seconds but remains standing independently Total Score: 25 Static Standing Balance Static Standing - Balance Support: During functional activity Static Standing - Level of Assistance: 5: Stand by assistance Dynamic Standing Balance Dynamic Standing - Balance Support: During functional activity;No upper extremity supported Dynamic Standing - Level of Assistance: 4: Min assist Dynamic Standing - Balance Activities: Reaching for weighted objects;Reaching for objects;Forward lean/weight shifting Extremity Assessment  RUE Assessment RUE Assessment: Within Functional Limits LUE Assessment LUE Assessment: Within Functional Limits RLE Assessment RLE Assessment: Exceptions to [facility] RLE Strength RLE Overall Strength: Deficits RLE Overall Strength Comments: grossly 3+/5 LLE Assessment LLE Assessment: Exceptions to [facility] LLE Strength LLE Overall Strength: Deficits LLE Overall Strength Comments: grossly 3+/5   See Function Navigator for Current Functional  Status.  Carney Living A 10/31/2016, 1:51 PM

## 2016-10-31 NOTE — Progress Notes (Signed)
Physical Therapy Session Note  Patient Details  Name: Deborah [name] MRN: [id number] Date of Birth: Dec 03, 1956  Today's Date: 10/31/2016 PT Individual Time: 1136-1208 and 1302-1400  PT Individual Time Calculation (min): 32 min and 58 min   Short Term Goals: Week 3:  PT Short Term Goal 1 (Week 3): = LTGs due to anticipated LOS  Skilled Therapeutic Interventions/Progress Updates:    Treatment 1: Patient in wheelchair upon arrival. Reviewed delivered DME (transport chair and RW) and parts management. Gait training using RW x 150 ft with close supervision and 1 lateral LOB to L requiring mod assist to recover. Performed simulated car transfer to sedan height using RW with supervision and verbal cues for safe technique. Patient instructed in gait training across mulched surface using RW x 10 ft with posterior LOB requiring total A to prevent fall. Patient appeared to have more difficulty with breathing this date with Sp02 >96% on room air and required seated rest breaks with minimal activity, discussed with RN and PA-C Pam. Patient returned to room and left sitting in wheelchair with all needs in reach.    Treatment 2: Patient in wheelchair, completing breathing treatment. Gait training using RW x 175 ft with close supervision and no LOB. Patient continues to require cues for safe hand placement with sit <> stand transfers using RW with supervision overall. Stair training up/down 8 steps using 2 rails with supervision. Patient required prolonged rest breaks due to fatigue and SOB with all mobility. Reviewed need for family to bump patient up/down stairs in wheelchair for home access, verbalized understanding. Patient demonstrates high fall risk as noted by score of 25/56 on Berg Balance Scale, improved from 18/56 on 10/13/16. Patient propelled wheelchair using BUE x 100 ft with supervision and increased time. Reviewed supine and standing HEP handouts for BLE strengthening and falls prevention, patient  verbalized understanding. Patient requested to return to bed, left semi reclined with all needs within reach.   Therapy Documentation Precautions:  Precautions Precautions: Fall Restrictions Weight Bearing Restrictions: No Pain: Pain Assessment Pain Assessment: No/denies pain  See Function Navigator for Current Functional Status.   Therapy/Group: Individual Therapy  Laretta Alstrom 10/31/2016, 12:19 PM

## 2016-10-31 NOTE — Progress Notes (Signed)
Occupational Therapy Discharge Summary  Patient Details  Name: Deborah [name] MRN: [id number] Date of Birth: 07/26/1956  Today's Date: 10/31/2016 OT Individual Time: 1000-1100 OT Individual Time Calculation (min): 60 min     Patient has met 7 of 7 long term goals due to improved activity tolerance, improved balance, postural control, ability to compensate for deficits, improved attention, improved awareness and improved coordination.  Patient to discharge at overall Supervision level. Pt continues to require supervision for safety when standing during ADL/self-care tasks. Patient's care partners are her 6 children who are ndependent to provide the necessary physical and cognitive assistance at discharge.    Reasons goals not met: n/a  Recommendation:  Patient will benefit from ongoing skilled OT services in home health setting to continue to advance functional skills in the area of BADL.  Equipment: tub transfer bench, 3-in-1 BSC, rolling walker, transport chair  Reasons for discharge: treatment goals met and discharge from hospital  Patient/family agrees with progress made and goals achieved: Yes    Skilled OT Intervention: Pt on 3 L of O2 via [place] upon OT arrival. SpO2 at 98%. Pt taken off of O2 for ADLs and maintained SpO2 at 93% or above. Pt utilized RW to ambulated to closet to gather clothing with supervision and VC for walker placement. Bathing/dressing completed at the sink with overall supervision and increased time. Pt's activity tolerance has improved, as she is able to tolerate standing for completion of self-care/grooming tasks. She still requires rest breaks 2/2 fatigue and continued endurance deficits. OT educated pt on continued energy conservation techniques and safety awareness within home environment. Pt left seated in wc at end of session with needs met and call bell within reach.     OT Discharge Precautions/Restrictions Precautions Precautions:  Fall Restrictions Weight Bearing Restrictions: No Pain Pain Assessment Pain Assessment: No/denies pain ADL ADL Grooming: Supervision/safety Where Assessed-Grooming: Standing at sink Upper Body Bathing: Supervision/safety Where Assessed-Upper Body Bathing: Wheelchair, Sitting at sink, Standing at sink Lower Body Bathing: Supervision/safety Where Assessed-Lower Body Bathing: Standing at sink, Sitting at sink, Wheelchair Upper Body Dressing: Independent Where Assessed-Upper Body Dressing: Wheelchair Lower Body Dressing: Supervision/safety Where Assessed-Lower Body Dressing: Wheelchair ADL Comments: Please see functional navigator  Cognition Overall Cognitive Status: Impaired/Different from baseline Arousal/Alertness: Awake/alert Orientation Level: Oriented X4 Attention: Selective Selective Attention: Impaired Selective Attention Impairment: Functional basic Memory: Impaired Memory Impairment: Retrieval deficit;Decreased recall of new information;Storage deficit Awareness: Impaired Awareness Impairment: Anticipatory impairment Problem Solving: Impaired Problem Solving Impairment: Functional basic Safety/Judgment: Impaired Balance Balance Balance Assessed: Yes Static Standing Balance Static Standing - Balance Support: During functional activity Static Standing - Level of Assistance: 5: Stand by assistance Dynamic Standing Balance Dynamic Standing - Balance Support: During functional activity;No upper extremity supported Dynamic Standing - Level of Assistance: 5: Stand by assistance Dynamic Standing - Balance Activities: Reaching for weighted objects;Reaching for objects;Forward lean/weight shifting Extremity/Trunk Assessment RUE Assessment RUE Assessment: Within Functional Limits LUE Assessment LUE Assessment: Within Functional Limits   See Function Navigator for Current Functional Status.  Deborah [name] Deborah [name] 10/31/2016, 12:29 PM

## 2016-10-31 NOTE — Plan of Care (Signed)
Problem: RH Balance Goal: LTG Patient will maintain dynamic standing balance (PT) LTG:  Patient will maintain dynamic standing balance with assistance during mobility activities (PT)  Outcome: Not Met (add Reason) Patient requires min A overall for dynamic standing balance.

## 2016-10-31 NOTE — Progress Notes (Signed)
Speech Language Pathology Discharge Summary  Patient Details  Name: Deborah [name] MRN: [id number] Date of Birth: 1956-10-28  Today's Date: 10/31/2016 SLP Individual Time: 0832-0932 SLP Individual Time Calculation (min): 60 min    Skilled Therapeutic Interventions:   Skilled treatment session focused on addressing dysphagia and cognition goals. SLP facilitated session by providing education regarding recommended textures.  Patient able to verbalize appropriate textures such as soft canned fruit and vegetables as well as soft moist meat with Min cues to utilize external aid.  RN present for medication administration upgrade to small meds whole in puree and large crushed in puree.  SLP observed patient to alternate medication boluses with thin liquid cup sips and perform chin tuck in 50% of opportunities.  Patient required Max assist verbal and visual cues faded to Min verbal cues to perform tuck prior to initial swallow.  Patient with intermittent persistent dry cough throughout session, which is not related to aspiration.  Patient able to recall re-assessment from yesterday's session; however, could not recall specific deficits that were noted.  SLP re-educated on deficits in attention and recall and provided education regarding home management techniques.  Patient able to anticipate how to implement at home with Mod question cues.  Goals met and education complete; patient ready for discharge..   Patient has met 5 of 6 long term goals.  Patient to discharge at overall Supervision;Min level.  Reasons goals not met: patient requires Mod cues to recall vocal hygiene strategies    Clinical Impression/Discharge Summary:    Patient has made functional gains during this rehab admission. With improvements noted on the re-assessment of the [facility] Cognitive Assessment (MoCA) 7.2 with a score of 23/30 points (26 points or above considered normal), compared to previous administration on 10/12/16 when  patient scored 21/30 points.  Patient continues to exhibit deficits in attention and recall.  She is currently an overall Supervision-Min assist for basic cognitive tasks and requires Supervision-Min verbal cues for utilization of swallowing compensatory strategies to minimize safety with a Dys.2/Dys.3 texture diet with thin liquids via cup.  Patient education has been completed with handouts left for family and patient will discharge home with 24 hour supervision. Patient would benefit from follow up SLP services to continue efforts to maximize cognitive-linguistic skills and her functional independence to further reduce the burden of care.   Care Partner:  Caregiver Able to Provide Assistance: Yes  Type of Caregiver Assistance: Cognitive;Physical  Recommendation:  Home Health SLP;24 hour supervision/assistance  Rationale for SLP Follow Up: Maximize cognitive function and independence;Maximize swallowing safety;Reduce caregiver burden;Maximize functional communication   Equipment: None   Reasons for discharge: Treatment goals met;Discharged from hospital   Patient/Family Agrees with Progress Made and Goals Achieved: Yes   Function:  Eating Eating   Modified Consistency Diet: Yes Eating Assist Level: Supervision or verbal cues;Set up assist for   Eating Set Up Assist For: Opening containers       Cognition Comprehension Comprehension assist level: Follows basic conversation/direction with no assist  Expression   Expression assist level: Expresses basic needs/ideas: With no assist  Social Interaction Social Interaction assist level: Interacts appropriately 90% of the time - Needs monitoring or encouragement for participation or interaction.  Problem Solving Problem solving assist level: Solves basic 90% of the time/requires cueing < 10% of the time  Memory Memory assist level: Recognizes or recalls 75 - 89% of the time/requires cueing 10 - 24% of the time   Carmelia Roller.,  CCC-SLP [phone]  Jelina Paulsen 10/31/2016, 10:07 AM

## 2016-10-31 NOTE — Plan of Care (Signed)
Problem: RH Balance Goal: LTG Patient will maintain dynamic standing with ADLs (OT) LTG:  Patient will maintain dynamic standing balance with assist during activities of daily living (OT)   Outcome: Completed/Met Date Met: 10/31/16 Met 11/28-ESD  Problem: RH Grooming Goal: LTG Patient will perform grooming w/assist,cues/equip (OT) LTG: Patient will perform grooming with assist, with/without cues using equipment (OT)  Outcome: Completed/Met Date Met: 10/31/16 Met- 11/28- ESD  Problem: RH Bathing Goal: LTG Patient will bathe with assist, cues/equipment (OT) LTG: Patient will bathe specified number of body parts with assist with/without cues using equipment (position)  (OT)  Outcome: Completed/Met Date Met: 10/31/16 Met - 11/28 ESD  Problem: RH Dressing Goal: LTG Patient will perform upper body dressing (OT) LTG Patient will perform upper body dressing with assist, with/without cues (OT).  Outcome: Completed/Met Date Met: 10/31/16 Met- 11/28 - ESD

## 2016-10-31 NOTE — Progress Notes (Signed)
Nutrition Follow-up  DOCUMENTATION CODES:   Non-severe (moderate) malnutrition in context of chronic illness  INTERVENTION:  Continue Boost Breeze po TID, each supplement provides 250 kcal and 9 grams of protein.  Continue 30 ml Prostat po TID, each supplement provides 100 kcal and 15 grams of protein.   "Choose a Meal" Renal diet education booklet handout given.   NUTRITION DIAGNOSIS:   Malnutrition related to chronic illness as evidenced by moderate depletion of body fat, moderate depletions of muscle mass; ongoing  GOAL:   Patient will meet greater than or equal to 90% of their needs; met  MONITOR:   PO intake, Supplement acceptance, Diet advancement, Labs, Weight trends, Skin, I & O's  REASON FOR ASSESSMENT:   Consult Calorie Count (poor po)  ASSESSMENT:   60 y.o. female with history of T2DM, HTN, CKD stage III with medical non-compliance who was originally admitted to [facility] 08/15/16 with NSTEMI and underwent cardiac cath with stenting.  She developed acute renal failure with metabolic acidosis and oliguria requiring CRRT. She went on to develop cardiogenic shock requiring intubation and was transferred to [facility] on 08/18/16 for treatment. She underwent mitral valve repair by [place] Gum on 9/21.  Right IJ placed on 9/28 and HD initiated.  She underwent tracheostomy on 10/5 and was weaned to ATC and tolerating PMSV for short periods of time. Has completed treatment for enterobacter HCAP and was decannualted on 10/25. Panda removed due to concerns of aspiration. Follow up MBS done today and diet advanced to dysphagia 3, thin. Therapy ongoing and patient with weakness with balance and proprioceptive deficits, scissoring gait as well as dysphagia.  Plans for discharge home tomorrow. Meal completion has been varied from 20-100%. Pt reports appetite, chewing, swallowing has improved. Pt currently has Boost Breeze and Prostat and has been consuming them. RD to continue with current orders to  aid in adequate caloric and protein needs. Pt educated on the importance of adequate intake especially protein intake once discharge home. Pt expressed understanding. RD additionally consulted for diet education. Pt was given "Choose a Meal" Renal diet education booklet. Noted renal/carbohydrate modified diet education has been given previously. Noted daughter requesting information on outpatient nutritional appointment for diet education. Contact information on the [facility] was given. Pt instructed to make an appointment once discharged.   Diet Order:  DIET DYS 2 Room service appropriate? Yes; Fluid consistency: Thin; Fluid restriction: 1200 mL Fluid  Skin:  Reviewed, no issues  Last BM:  11/27  Height:   Ht Readings from Last 1 Encounters:  10/11/16 _0  (1.549 m)    Weight:   Wt Readings from Last 1 Encounters:  10/29/16 123 lb 7.3 oz (56 kg)    Ideal Body Weight:  47.7 kg  BMI:  Body mass index is 23.33 kg/m.  Estimated Nutritional Needs:   Kcal:  1700-1900  Protein:  80-100 grams  Fluid:  1.2 L/day  EDUCATION NEEDS:   No education needs identified at this time  Corrin Parker, MS, RD, LDN Pager # [phone] After hours/ weekend pager # [phone]

## 2016-11-01 ENCOUNTER — Inpatient Hospital Stay (HOSPITAL_COMMUNITY): Payer: Medicaid Other

## 2016-11-01 DIAGNOSIS — Z7901 Long term (current) use of anticoagulants: Secondary | ICD-10-CM

## 2016-11-01 LAB — PROTIME-INR
INR: 2.33
Prothrombin Time: 26 seconds — ABNORMAL HIGH (ref 11.4–15.2)

## 2016-11-01 LAB — URINE CULTURE

## 2016-11-01 LAB — GLUCOSE, CAPILLARY
GLUCOSE-CAPILLARY: 252 mg/dL — AB (ref 65–99)
Glucose-Capillary: 250 mg/dL — ABNORMAL HIGH (ref 65–99)
Glucose-Capillary: 131 mg/dL — ABNORMAL HIGH (ref 65–99)

## 2016-11-01 MED ORDER — INSULIN DETEMIR 100 UNIT/ML FLEXPEN: 8.0000 [IU] | PEN_INJECTOR | Freq: Every day | SUBCUTANEOUS | 0 refills | Status: DC

## 2016-11-01 MED ORDER — BLOOD GLUCOSE MONITOR KIT: PACK | 0 refills | Status: DC

## 2016-11-01 MED ORDER — ATORVASTATIN CALCIUM 20 MG PO TABS: 20.0000 mg | ORAL_TABLET | Freq: Every day | ORAL | 0 refills | Status: DC

## 2016-11-01 MED ORDER — GUAIFENESIN 100 MG/5ML PO SOLN: 30.0000 mL | Freq: Four times a day (QID) | ORAL | 0 refills | Status: DC

## 2016-11-01 MED ORDER — INSULIN DETEMIR 100 UNIT/ML ~~LOC~~ SOLN
8.0000 [IU] | Freq: Every day | SUBCUTANEOUS | Status: DC
  Administered 2016-11-01: 8 [IU] via SUBCUTANEOUS
  Filled 2016-11-01: qty 0.08

## 2016-11-01 MED ORDER — WARFARIN SODIUM 2 MG PO TABS
2.0000 mg | ORAL_TABLET | Freq: Once | ORAL | Status: AC
Start: 2016-11-01 — End: 2016-11-01
  Administered 2016-11-01: 2 mg via ORAL
  Filled 2016-11-01: qty 1

## 2016-11-01 MED ORDER — "NEEDLE (DISP) 30G X 1/2"" MISC": 1.0000 "application " | Freq: Every day | 0 refills | Status: DC

## 2016-11-01 MED ORDER — CEFDINIR 125 MG/5ML PO SUSR: 300.0000 mg | ORAL | 0 refills | Status: DC

## 2016-11-01 MED ORDER — MIDODRINE HCL 10 MG PO TABS: 10.0000 mg | ORAL_TABLET | Freq: Every morning | ORAL | 0 refills | Status: DC

## 2016-11-01 MED ORDER — AMIODARONE HCL 200 MG PO TABS: 200.0000 mg | ORAL_TABLET | Freq: Every day | ORAL | 0 refills | Status: DC

## 2016-11-01 MED ORDER — WARFARIN SODIUM 4 MG PO TABS: ORAL_TABLET | ORAL | 0 refills | Status: DC

## 2016-11-01 MED ORDER — ALPRAZOLAM 0.25 MG PO TABS: 0.2500 mg | ORAL_TABLET | Freq: Two times a day (BID) | ORAL | 0 refills | Status: DC | PRN

## 2016-11-01 MED ORDER — HYDROCORTISONE 2.5 % RE CREA: TOPICAL_CREAM | Freq: Two times a day (BID) | RECTAL | 0 refills | Status: DC | PRN

## 2016-11-01 MED ORDER — LANTHANUM CARBONATE 1000 MG PO CHEW: 1000.0000 mg | CHEWABLE_TABLET | Freq: Three times a day (TID) | ORAL | 0 refills | Status: AC

## 2016-11-01 MED ORDER — ALBUTEROL SULFATE (2.5 MG/3ML) 0.083% IN NEBU: 2.5000 mg | INHALATION_SOLUTION | Freq: Three times a day (TID) | RESPIRATORY_TRACT | 1 refills | Status: AC

## 2016-11-01 MED ORDER — PANTOPRAZOLE SODIUM 40 MG PO TBEC: 40.0000 mg | DELAYED_RELEASE_TABLET | Freq: Two times a day (BID) | ORAL | 0 refills | Status: DC

## 2016-11-01 MED ORDER — FLUTICASONE PROPIONATE 50 MCG/ACT NA SUSP: 2.0000 | Freq: Every day | NASAL | 0 refills | Status: DC

## 2016-11-01 MED ORDER — RENA-VITE PO TABS: 1.0000 | ORAL_TABLET | Freq: Every day | ORAL | 0 refills | Status: AC

## 2016-11-01 MED ORDER — METOCLOPRAMIDE HCL 5 MG PO TABS: 5.0000 mg | ORAL_TABLET | Freq: Two times a day (BID) | ORAL | 0 refills | Status: DC

## 2016-11-01 MED ORDER — CLOPIDOGREL BISULFATE 75 MG PO TABS: 75.0000 mg | ORAL_TABLET | Freq: Every day | ORAL | 0 refills | Status: DC

## 2016-11-01 MED ORDER — WARFARIN SODIUM 2 MG PO TABS: ORAL_TABLET | ORAL | 0 refills | Status: DC

## 2016-11-01 NOTE — Discharge Instructions (Signed)
Inpatient Rehab Discharge Instructions  Deborah [name] Discharge date and time:  11/01/16  Activities/Precautions/ Functional Status: Activity: no lifting, driving, or strenuous exercise till cleared by MD.  Diet: renal diet--soft/chopped foods.  1200 cc fluid/day Wound Care: keep wound clean and dry Contact MD if you develop any problems with your incision/wound--redness, swelling, increase in pain, drainage or if you develop fever or chills.   Functional status:  ___ No restrictions     ___ Walk up steps independently _X__ 24/7 supervision/assistance   ___ Walk up steps with assistance ___ Intermittent supervision/assistance  ___ Bathe/dress independently ___ Walk with walker     ___ Bathe/dress with assistance ___ Walk Independently    ___ Shower independently ___ Walk with assistance    ___ Shower with assistance _X__ No alcohol     ___ Return to work/school ________   Special Instructions: 1. Family needs to assist with medications, financial management and provide supervision at meal times.   2. No driving, lifting anything over 5 lbs or strenuous activity. 3. You need to take her to coumadin clinic this Friday for blood draw--after this time they can get you set up with clinic in [place]. .  4. Check blood sugars before meals and at bedtime.    COMMUNITY REFERRALS UPON DISCHARGE:    Home Health:   RN ONLY UNTIL MEDICARE APPROVED    Agency:[facility] Phone:[phone]   Date of last service:11/01/2016   Medical Equipment/Items Ordered:TRANSPORT CHAIR, ROLLING WALKER, BEDSIDE COMMODE, TUB BENCH AND NEBULIZER MACHINE  Agency/Supplier:[facility]   [phone]  [facility] PROGRAM FOR MEDICATION ASSISTANCE  HEMODIALYSIS--[place] [address] 12:00 SLOT EVERY T, TH AND SAT FIRST APPOINTMENT ON Thursday BE THERE AT 11:15 AM   My questions have been answered and I understand these instructions. I will adhere to these goals and the provided educational  materials after my discharge from the hospital.  Patient/Caregiver Signature _______________________________ Date __________  Clinician Signature _______________________________________ Date __________  Please bring this form and your medication list with you to all your follow-up doctor's appointments.

## 2016-11-01 NOTE — Progress Notes (Signed)
      [address]       [place] [address]             [phone]         Subjective: Still with cough. Excited to go home.  Objective: Vital signs in last 24 hours: Temp:  [97.7 F (36.5 C)-98.8 F (37.1 C)] 98.8 F (37.1 C) (11/29 0400) Pulse Rate:  [88-100] 100 (11/29 0400) Resp:  [17-24] 24 (11/29 0400) BP: (76-135)/(37-72) 118/64 (11/29 0400) SpO2:  [97 %-100 %] 97 % (11/29 0400) Weight:  [122 lb 2.2 oz (55.4 kg)-126 lb 1.7 oz (57.2 kg)] 122 lb 2.2 oz (55.4 kg) (11/29 0400)    Intake/Output from previous day: 11/28 0701 - 11/29 0700 In: 485 [P.O.:485] Out: 500  Intake/Output this shift: Total I/O In: 120 [P.O.:120] Out: -   General appearance: alert, cooperative and no distress Heart: regular rate and rhythm Lungs: clear to auscultation bilaterally Abdomen: soft, non-tender; bowel sounds normal; no masses,  no organomegaly Extremities: extremities normal, atraumatic, no cyanosis or edema Wound: clean and dry  Lab Results:  Recent Labs  10/30/16 0606 10/31/16 1803  WBC 13.3* 10.3  HGB 10.6* 9.6*  HCT 33.6* 31.1*  PLT 365 463*   BMET:  Recent Labs  10/31/16 1804  NA 130*  K 3.1*  CL 93*  CO2 24  GLUCOSE 345*  BUN 48*  CREATININE 5.47*  CALCIUM 8.8*    PT/INR:  Recent Labs  11/01/16 0514  LABPROT 26.0*  INR 2.33   ABG    Component Value Date/Time   PHART 7.487 (H) 10/04/2016 1430   HCO3 28.4 (H) 10/04/2016 1430   TCO2 26 09/08/2016 0412   ACIDBASEDEF 4.1 (H) 09/02/2016 1850   O2SAT 95.5 10/04/2016 1430   CBG (last 3)   Recent Labs  10/31/16 1139 10/31/16 1924 11/01/16 0629  GLUCAP 218* 142* 250*    Assessment/Plan:  1. Making good progress with PT/OT/speech pathology 2. Cough- Management per primary 3. WBC 10.3 today. Has been mildly elevated since admission.  4. HD per nephrology  5. Coumadin per pharmacy. INR 2.33-therapeutic.  6. CT of the chest and neck done. No tracheal stenosis. 7. Needs  tighter glycemic control, last CBGs 250, 142, 218. Primary titration of Levemir.   Plan: Discharge home today. Will follow-up in our office on 11/08/2016. Can call anytime with questions.    LOS: 21 days    Deborah [name] 11/01/2016

## 2016-11-01 NOTE — Discharge Summary (Addendum)
Physician Discharge Summary  Patient ID: Deborah [name] MRN: [id number] DOB/AGE: 59/11/1956 59 y.o.  Admit date: 10/11/2016 Discharge date: 11/01/2016  Discharge Diagnoses:  Principal Problem:   Physical debility Active Problems:   PAF (paroxysmal atrial fibrillation) ([name])   History of MVR with cardiopulmonary bypass   Metabolic encephalopathy   Oropharyngeal dysphagia   Uncontrolled type 2 diabetes mellitus with complication, with long-term current use of insulin ([name])   Cough   Leukocytosis   Labile blood glucose   Acute lower UTI   Chronic anticoagulation   Discharged Condition: stable   Significant Diagnostic Studies:   Labs:  Basic Metabolic Panel:  Recent Labs Lab 10/31/16 1804  NA 130*  K 3.1*  CL 93*  CO2 24  GLUCOSE 345*  BUN 48*  CREATININE 5.47*  CALCIUM 8.8*  PHOS 2.5    CBC:  Recent Labs Lab 10/27/16 0613 10/30/16 0606 10/31/16 1803  WBC 10.0 13.3* 10.3  HGB 10.2* 10.6* 9.6*  HCT 32.7* 33.6* 31.1*  MCV 87.2 87.3 86.4  PLT 309 365 463*    CBG:  Recent Labs Lab 10/31/16 1139 10/31/16 1924 11/01/16 0629 11/01/16 1134 11/01/16 1638  GLUCAP 218* 142* 250* 252* 131*    Brief HPI:   Deborah [name] a 59 y.o.femalewith history of T2DM, HTN, CKD stage III with medical non-compliance who was originally admitted to [facility] 08/15/16 with NSTEMI and underwent cardiac cath with stenting. History taken from chart review. She developed acute renal failure with metabolic acidosis and oliguria requiring CRRT. She went on to develop cardiogenic shock requiring intubation and was transferred to [facility] on 08/18/16 for treatment. 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 She underwent mitral valve repair by Prescott Gum on 9/21. Right IJ placed on 9/28 and HD initiated. She was extubated on 9/29 but reintubated due to septic shock due to staph bacteremia and required pressors for support. A flutter  treated with TEE DCCV on 10/2. On  Coumadin and amiodarone for sinus tach with PAF. She underwent tracheostomy on 10/5 and was weaned to ATC and tolerating PMSV for short periods of time. Has completed treatment for enterobacter HCAP and was decannualted on 10/25.  MBS done 10/23 showing moderate pharyngeal dysphagia--she was started on dysphagia 2, nectar liquids with chin tuck to prevent penetration.  LUE AV graft and tunneled diatek catheter placed on 10/30 as patient HD dependent. Has been waned off milrinone and is tolerating HD session with midodrine on board. She continued to have frequent productive cough as well as paroxysmal episodes with activity despite treatment for bronchitis.   She did have an episode of hypoxia and panda removed due to concerns of aspiration and diet advanced to dysphagia 3. Therapy ongoing and patient with weakness with balance and proprioceptive deficits, scissoring gait as well as dysphagia. CIR was recommended for follow up therapy.    Hospital Course: Deborah [name] was admitted to rehab 10/11/2016 for inpatient therapies to consist of PT, ST and OT at least three hours five days a week. Past admission physiatrist, therapy team and rehab RN have worked together to provide customized collaborative inpatient rehab.  Blood pressures have been stable with ProAmatine on board and she is tolerating HD on TTS after therapy sessions. She continued to have issues with prolonged coughing episodes despite addition of multiple cough medications. PPI was increased to bid and albuterol  nebulizer's were scheduled qid.  CT of chest/neck was done to rule out  tracheal mass/stenosis and showed mildly enlarged pulmonary artery accounting for possible mediastinal mass seen on CT.  She continues to have BLL atelectasis and has been educated on utilizing flutter valve every hour. Dr. Benjamine Mola was consulted for input and recommended follow up on outpatient basis as well as trial of gabapentin.   She was placed on gabapentin 300 mg at bedtime without improvement therefore this was discontinued. She continues on low dose Reglan bid and diet was down graded to dysphagia 2 to help with intake and for safety. Liquids have been advanced to thin with chin tuck. She was loaded with IV iron and aranesp 100 mcg weekly was added to help with anemia of chronic disease.   Heart rate has been stable on amiodarone and coumadin has been managed by pharmacy with protime checks. INR goal is 2-3 and she was discharged on 4 mg Mon/Wed/Fri alternating with 2 mg all other days.  Amiodarone was decreased to daily after discussion with cardiology to see if this would help with cough. Her po intake is slowly improving with stabilization of blood sugars. Diabetes has been monitored on ac/hs basis and levemir was adjusted to 8 units at bedtime.  She is tolerating HD on T,Th,Sa and fosrenol was added to help manage elevated phosphorous levels.  She was found to have leucocytosis on 11/27 and was started on cefdinir due to concerns of UTI. CXR done prior to discharge was negative for effusions or acute pulmonary disease.  She is to continue this for one week total for treatment after discharge. Sternotomy incision is intact and healing well without signs or symptoms of infection.    Rehab course: During patient's stay in rehab weekly team conferences were held to monitor patient's progress, set goals and discuss barriers to discharge. At admission, patient required max assist with basic self care tasks and moderate assist with mobility. She demonstrated moderate cognitive impairments with decrease in memory, recall and functional problem solving.  She has had improvement in activity tolerance, balance, postural control, as well as ability to compensate for deficits. She requires close supervision with cues transfers and to ambulate 150' with RW. She is able to complete ADL tasks with supervision for safety. She continues to  require supervision to min assist for basic cognitive tasks and to utilize safe swallow measures. MoCA score has improved from 21/30 to  23/30. Patient education was completed with multiple family member regarding safety, swallow precautions and assistance with cognitive tasks/medication management.     Disposition:  Home  Diet: Renal diet. Soft foods. 1200 cc fluid/day  Special Instructions: 1. Family needs to assist with medications, financial management and provide supervision at meal times.   2. No driving, lifting anything over 5 lbs or strenuous activity. 3. Family to go to coumadin clinic this Friday for blood draw. 4. Check blood sugars before meals and at bedtime.       Medication List    STOP taking these medications   benzonatate 100 MG capsule Commonly known as:  TESSALON   chlorpheniramine-HYDROcodone 10-8 MG/5ML Suer Commonly known as:  TUSSIONEX   Darbepoetin Alfa 100 MCG/0.5ML Sosy injection Commonly known as:  ARANESP   famotidine 40 MG/5ML suspension Commonly known as:  PEPCID   feeding supplement (NEPRO CARB STEADY) Liqd   ferric gluconate 250 mg in sodium chloride 0.9 % 100 mL   glycopyrrolate 1 MG tablet Commonly known as:  ROBINUL   HYDROcodone-acetaminophen 7.5-325 mg/15 ml solution Commonly known as:  HYCET   insulin  aspart 100 UNIT/ML injection Commonly known as:  novoLOG   mouth rinse Liqd solution   ondansetron 4 MG/2ML Soln injection Commonly known as:  ZOFRAN   RESOURCE THICKENUP CLEAR Powd     TAKE these medications   albuterol (2.5 MG/3ML) 0.083% nebulizer solution Commonly known as:  PROVENTIL Take 3 mLs (2.5 mg total) by nebulization 4 (four) times daily - after meals and at bedtime. What changed:  when to take this  reasons to take this   ALPRAZolam 0.25 MG tablet--Rx # 30 pills Commonly known as:  XANAX Take 1 tablet (0.25 mg total) by mouth 2 (two) times daily as needed for anxiety.   amiodarone 200 MG  tablet Commonly known as:  PACERONE Take 1 tablet (200 mg total) by mouth daily. What changed:  when to take this   atorvastatin 20 MG tablet Commonly known as:  LIPITOR Take 1 tablet (20 mg total) by mouth daily.   blood glucose meter kit and supplies Kit Dispense based on patient and insurance preference. Use up to four times daily as directed. (FOR ICD-9 250.00, 250.01).   cefdinir 125 MG/5ML suspension Commonly known as:  OMNICEF Take 12 mLs (300 mg total) by mouth every other day. Start taking on:  11/02/2016   clopidogrel 75 MG tablet Commonly known as:  PLAVIX Take 1 tablet (75 mg total) by mouth daily.   diphenhydrAMINE 12.5 MG/5ML liquid Commonly known as:  BENADRYL Take 5 mLs (12.5 mg total) by mouth daily as needed (before dialysis). What changed:  Another medication with the same name was removed. Continue taking this medication, and follow the directions you see here.   fluticasone 50 MCG/ACT nasal spray Commonly known as:  FLONASE Place 2 sprays into both nostrils daily. Start taking on:  11/02/2016   Gerhardt's butt cream Crea Apply 1 application topically 2 (two) times daily.   guaiFENesin 100 MG/5ML Soln Commonly known as:  ROBITUSSIN Take 30 mLs (600 mg total) by mouth every 6 (six) hours. What changed:  how to take this   hydrocortisone 2.5 % rectal cream Commonly known as:  ANUSOL-HC Place rectally 2 (two) times daily as needed for hemorrhoids or itching. What changed:  when to take this  reasons to take this   Insulin Detemir 100 UNIT/ML Pen Commonly known as:  LEVEMIR Inject 8 Units into the skin daily at 10 pm. What changed:  how much to take  when to take this   lanthanum 1000 MG chewable tablet Commonly known as:  FOSRENOL Chew 1 tablet (1,000 mg total) by mouth 3 (three) times daily with meals.   metoCLOPramide 5 MG tablet Commonly known as:  REGLAN Take 1 tablet (5 mg total) by mouth 2 (two) times daily with a meal.    midodrine 10 MG tablet Commonly known as:  PROAMATINE Take 1 tablet (10 mg total) by mouth every morning. Start taking on:  11/02/2016   multivitamin Tabs tablet Take 1 tablet by mouth at bedtime.   NEEDLE (DISP) 30 G 30G X 1/2" Misc Commonly known as:  BD DISP NEEDLES 1 application by Does not apply route at bedtime.   pantoprazole 40 MG tablet Commonly known as:  PROTONIX Take 1 tablet (40 mg total) by mouth 2 (two) times daily.   warfarin 2 MG tablet Commonly known as:  COUMADIN Take 2 mg on Tue, Thu, Sat, Sun What changed:  medication strength  how much to take  how to take this  when to take this  additional instructions   warfarin 4 MG tablet Commonly known as:  COUMADIN Take 4 mg on Mon, Wed, Fri with supper What changed:  You were already taking a medication with the same name, and this prescription was added. Make sure you understand how and when to take each.      Follow-up Information    Ankit Lorie Phenix, MD Follow up.   Specialty:  Physical Medicine and Rehabilitation Why:  office will call you with follow up appointment Contact information: [address] [place] [address] [phone]        Tharon Aquas Kerby Less III, MD Follow up on 11/08/2016.   Specialty:  Cardiothoracic Surgery Why:  Appointment at 12:30 pm Contact information: [address] [place] [address] [phone]        [facility] Follow up.   Specialty:  Primary Care Why:  FAMILY NEEDS TO TAKE PATIENT IN TO THE CLINIC IN THE NEXT 1-2 DAYS--SHE WILL NEED TO GO THROUGH A ELIGIBILITY PROCESS AGAIN BEFORE APPOINTMENT CAN BE MADE. Contact information: [place], [place] Follow up on 11/29/2016.   Specialty:  Otolaryngology Why:  Be there at 4:00 pm for evaluation of chronic cough.  Contact information: [place]  [place] [address] [phone]           Signed: Bary Leriche 11/01/2016, 6:10 PM

## 2016-11-01 NOTE — Progress Notes (Signed)
ANTICOAGULATION CONSULT NOTE - Follow Up Consult  Pharmacy Consult for coumadin Indication: aflutter  No Known Allergies  Patient Measurements: Height: 5\' 1"  (154.9 cm) Weight: 122 lb 2.2 oz (55.4 kg) IBW/kg (Calculated) : 47.8 Heparin Dosing Weight:   Vital Signs: Temp: 98.8 F (37.1 C) (11/29 0400) Temp Source: Oral (11/29 0400) BP: 118/64 (11/29 0400) Pulse Rate: 100 (11/29 0400)  Labs:  Recent Labs  10/30/16 0606 10/31/16 0647 10/31/16 1803 10/31/16 1804 11/01/16 0514  HGB 10.6*  --  9.6*  --   --   HCT 33.6*  --  31.1*  --   --   PLT 365  --  463*  --   --   LABPROT 20.6* 27.6*  --   --  26.0*  INR 1.74 2.52  --   --  2.33  CREATININE  --   --   --  5.47*  --     Estimated Creatinine Clearance: 8.4 mL/min (by C-G formula based on SCr of 5.47 mg/dL (H)).   Assessment: 60 yo female with aflutter is currently on therapeutic coumadin INR 2.33 today. Continue on amiodarone 200 mg daily. CBC stable, no bleeding noted per chart.   Goal of Therapy:  INR 2-2.5 Monitor platelets by anticoagulation protocol: Yes   Plan:  - coumadin 2 mg po x1 - INR in am  Maryanna Shape, PharmD, BCPS  Clinical Pharmacist  Pager: [phone]   11/01/2016,11:43 AM

## 2016-11-01 NOTE — Progress Notes (Signed)
[facility] PHYSICAL MEDICINE & REHABILITATION     PROGRESS NOTE  Subjective/Complaints:  Pt sitting up in bed this AM.  She continues to have a cough and this is her main concern.   ROS: +cough.  Denies CP, SOB, NV/D.  Objective: Vital Signs: Blood pressure 118/64, pulse 100, temperature 98.8 F (37.1 C), temperature source Oral, resp. rate (!) 24, height 5\' 1"  (1.549 m), weight 55.4 kg (122 lb 2.2 oz), SpO2 97 %. Dg Chest 2 View  Result Date: 11/01/2016 CLINICAL DATA:  Wheezing, shortness of breath, cough. EXAM: CHEST  2 VIEW COMPARISON:  Radiographs of October 30, 2016. FINDINGS: Stable cardiomegaly. Status post cardiac valve repair. Left internal jugular dialysis catheter is unchanged in position. No pneumothorax or pleural effusion is noted. No acute pulmonary disease is noted. Bony thorax is unremarkable. IMPRESSION: No active cardiopulmonary disease. Electronically Signed   By: Marijo Conception, M.D.   On: 11/01/2016 07:25   Dg Chest 2 View  Result Date: 10/30/2016 CLINICAL DATA:  Cough and leukocytosis EXAM: CHEST  2 VIEW COMPARISON:  Chest radiograph October 15, 2016 and chest CT October 21, 2016 FINDINGS: There is persistent consolidation in the medial bases bilaterally, slightly more although left than on the right. No new parenchymal lung opacity evident. Heart is borderline prominent pulmonary vascularity within normal limits. No adenopathy. Port-A-Cath tip is at the cavoatrial junction. No pneumothorax. There is a mitral valve replacement. No bone lesions. IMPRESSION: Bibasilar consolidation. No new opacity. Stable cardiac silhouette. Electronically Signed   By: Lowella Grip III M.D.   On: 10/30/2016 10:46    Recent Labs  10/30/16 0606 10/31/16 1803  WBC 13.3* 10.3  HGB 10.6* 9.6*  HCT 33.6* 31.1*  PLT 365 463*    Recent Labs  10/31/16 1804  NA 130*  K 3.1*  CL 93*  GLUCOSE 345*  BUN 48*  CREATININE 5.47*  CALCIUM 8.8*   CBG (last 3)   Recent Labs   10/31/16 1139 10/31/16 1924 11/01/16 0629  GLUCAP 218* 142* 250*    Wt Readings from Last 3 Encounters:  11/01/16 55.4 kg (122 lb 2.2 oz)  10/11/16 58.1 kg (128 lb 1.4 oz)  08/18/16 71.2 kg (156 lb 15.5 oz)    Physical Exam:  BP 118/64 (BP Location: Right Arm)   Pulse 100   Temp 98.8 F (37.1 C) (Oral)   Resp (!) 24   Ht 5\' 1"  (1.549 m)   Wt 55.4 kg (122 lb 2.2 oz)   SpO2 97%   BMI 23.08 kg/m  Constitutional:  She appears well-developed and well-nourished. NAD. Vital signs reviewed.  HENT: [place], AT Eyes: EOM are normal. No discharge.  Cardiovascular: RRR. No JVD. Respiratory: Clear to auscultation. Unlabored. GI: Soft, NT.  BS+.  Musculoskeletal: She exhibits no tenderness. She exhibits no edema.  Neurological: She is alert and oriented.  Dysphonia (unchanged) Motor: 4+/5 proximal to distal bilateral UE/LE (stable) Skin: Skin is warm and dry. She is not diaphoretic. No erythema.  Incision healed  Psychiatric: Her affect is flat. Normal behavior.   Assessment/Plan: 1. Functional deficits secondary to debility from multiply cardial and pulmonary issues which require 3+ hours per day of interdisciplinary therapy in a comprehensive inpatient rehab setting. Physiatrist is providing close team supervision and 24 hour management of active medical problems listed below. Physiatrist and rehab team continue to assess barriers to discharge/monitor patient progress toward functional and medical goals.  Function:  Bathing Bathing position   Position: Wheelchair/chair at sink  Bathing parts Body parts bathed by patient: Right arm, Left arm, Chest, Abdomen, Front perineal area, Buttocks, Right upper leg, Right lower leg, Left upper leg, Left lower leg Body parts bathed by helper: Back  Bathing assist Assist Level: Supervision or verbal cues, Set up   Set up : To obtain items  Upper Body Dressing/Undressing Upper body dressing   What is the patient wearing?: Pull over  shirt/dress Bra - Perfomed by patient: Thread/unthread right bra strap, Thread/unthread left bra strap, Hook/unhook bra (pull down sports bra) Bra - Perfomed by helper: Hook/unhook bra (pull down sports bra) Pull over shirt/dress - Perfomed by patient: Thread/unthread right sleeve, Thread/unthread left sleeve, Put head through opening, Pull shirt over trunk Pull over shirt/dress - Perfomed by helper: Pull shirt over trunk        Upper body assist Assist Level: More than reasonable time   Set up : To obtain clothing/put away  Lower Body Dressing/Undressing Lower body dressing   What is the patient wearing?: Pants, Non-skid slipper socks, Underwear Underwear - Performed by patient: Thread/unthread right underwear leg, Thread/unthread left underwear leg, Pull underwear up/down Underwear - Performed by helper: Pull underwear up/down Pants- Performed by patient: Thread/unthread right pants leg, Thread/unthread left pants leg, Pull pants up/down Pants- Performed by helper: Thread/unthread right pants leg Non-skid slipper socks- Performed by patient: Don/doff right sock, Don/doff left sock Non-skid slipper socks- Performed by helper: Don/doff right sock                  Lower body assist Assist for lower body dressing: Supervision or verbal cues      Toileting Toileting Toileting activity did not occur: No continent bowel/bladder event Toileting steps completed by patient: Adjust clothing prior to toileting, Performs perineal hygiene Toileting steps completed by helper: Adjust clothing after toileting Toileting Assistive Devices: Grab bar or rail  Toileting assist Assist level: Supervision or verbal cues   Transfers Chair/bed transfer Chair/bed transfer activity did not occur: Safety/medical concerns Chair/bed transfer method: Ambulatory, Stand pivot Chair/bed transfer assist level: Supervision or verbal cues Chair/bed transfer assistive device: Armrests, Environmental health practitioner     Max distance: 175 ft Assist level: Supervision or verbal cues   Wheelchair   Type: Manual Max wheelchair distance: 150 ft Assist Level: Supervision or verbal cues  Cognition Comprehension Comprehension assist level: Follows basic conversation/direction with no assist  Expression Expression assist level: Expresses basic needs/ideas: With no assist  Social Interaction Social Interaction assist level: Interacts appropriately 90% of the time - Needs monitoring or encouragement for participation or interaction.  Problem Solving Problem solving assist level: Solves basic 90% of the time/requires cueing < 10% of the time  Memory Memory assist level: Recognizes or recalls 75 - 89% of the time/requires cueing 10 - 24% of the time    Medical Problem List and Plan: 1.  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  D/c today  Will see patient for transitional care management in 1-2 weeks 2.  DVT Prophylaxis/Anticoagulation: Pharmaceutical: Coumadin per pharmacy  INR therapeutic 11/29 3. Pain Management: Hydrocodone prn effective.  Gabapentin d/ced 11/28 possibly due to ?cough 4. Mood: LCSW to follow for evaluation and support.  5. Neuropsych: This patient is not fully capable of making decisions on her own behalf. 6. Skin/Wound Care: Routine pressure relief measures.  7. Fluids/Electrolytes/Nutrition: Strict I/O. Renal panel TTSa with HD.   Diet changed to D2 to  help with fatigue  Weaned reglan 11/13, again on 11/14, increased back to 5mg  TID on 11/14 after regurgitation, weaned again 11/20, will need to wean further at outpt 8. Leucocytosis: ?Resolved  Completed cipro for bronchitis   WBCs 10.3 on 11/28, appreciate CTS follow-up   Labs with HD  UA mildly positive, Ucx mult-species  CXR unremarkable for acute process  Empiric cefdinir started 11/28 due to interaction with cipro with  amio/warfarin 10 T2DM:Hgb A1c- 13.3.   Levemir was held due to hypoglycemia, started Levemir 3U qhs on 11/23, increased to 5U 11/26, increased to [place] 11/28  Continues to be labile, but appears to be trending up  Will need further ambulatory adjustments, would be cautious as pt at risk for hypoglycemia   Will cont to monitor CBG (last 3)   Recent Labs  10/31/16 1139 10/31/16 1924 11/01/16 0629  GLUCAP 218* 142* 250*    11. ESRD: Now HD dependent--to schedule HD in afternoons to help with tolerance of therapy during the day. Educate patient and family on renal diet. Electrolyte abnormalities being managed during HD.  12. Anemia of chronic disease:   Hb 9.6 on 11/28  Monitor for signs of bleeding.   On aranesp for erythropoiesis and nulecit for iron load  Labs with HD 13. CAD s/p DES X 2 and MVR: Monitor for symptoms with increase in activity. On plavix and Lipitor.  14. Acute respiratory failure due to acute pulmonary edema:  Fluid overload compensated with HD.  15. AFlutter s/p DCCV/PAF: HR controlled on amiodarone. On coumadin.  16. Persistent cough with dysphonia, interfering with speech and swallowing:   Off antibiotics.   Repeat CXR 11/12 unremarkable for acute process, repeat CXR 11/27 reviewed, stable, CXR 11/29 reviewed, remains stable  Cont nebs  Continue robinul for secretions and Robitussin DM for cough, increased on 11/14  Encourage IS with use of flutter valve  CT of chest shows enlarged PA which likely appeared as potential mass on neck CT. Surgery following along  Respiratory panel neg 11/21  Appreciate CTS/HF evals  Trail of scheduled Nebs, respiratory d/ced  Trial throat spray, pt notes mild improvement  PPI increased 11/28  Will consider trail of thorazine in future 17. Sleep disturbance: managed with benadryl prn  LOS (Days) 21 A FACE TO FACE EVALUATION WAS PERFORMED  Bascom Biel Lorie Phenix 11/01/2016 8:22 AM

## 2016-11-01 NOTE — Progress Notes (Signed)
Patient ID: Deborah [name], female   DOB: 08-Apr-1956, 60 y.o.   MRN: [id number]  BP 118/64 (BP Location: Right Arm)   Pulse 100   Temp 98.8 F (37.1 C) (Oral)   Resp (!) 24   Ht 5\' 1"  (1.549 m)   Wt 55.4 kg (122 lb 2.2 oz)   SpO2 97%   BMI 23.08 kg/m   Patient reports that she is going home today-I will see her at the [facility] tomorrow.

## 2016-11-01 NOTE — Progress Notes (Signed)
Patient and her daughters discussed the discharged instructions with Pam Love,PA,before they left.

## 2016-11-02 ENCOUNTER — Telehealth: Payer: Self-pay

## 2016-11-02 ENCOUNTER — Emergency Department (HOSPITAL_COMMUNITY): Payer: Medicaid Other

## 2016-11-02 ENCOUNTER — Encounter (HOSPITAL_COMMUNITY): Payer: Self-pay | Admitting: *Deleted

## 2016-11-02 ENCOUNTER — Inpatient Hospital Stay (HOSPITAL_COMMUNITY)
Admission: EM | Admit: 2016-11-02 | Discharge: 2016-11-26 | DRG: 004 | Disposition: A | Discharge status: Other Acute Inpatient Hospital | Payer: Medicaid Other | Attending: Internal Medicine | Admitting: Internal Medicine

## 2016-11-02 ENCOUNTER — Telehealth: Payer: Self-pay | Admitting: *Deleted

## 2016-11-02 DIAGNOSIS — K219 Gastro-esophageal reflux disease without esophagitis: Secondary | ICD-10-CM | POA: Diagnosis present

## 2016-11-02 DIAGNOSIS — R627 Adult failure to thrive: Secondary | ICD-10-CM | POA: Diagnosis present

## 2016-11-02 DIAGNOSIS — I252 Old myocardial infarction: Secondary | ICD-10-CM

## 2016-11-02 DIAGNOSIS — R0602 Shortness of breath: Secondary | ICD-10-CM | POA: Diagnosis present

## 2016-11-02 DIAGNOSIS — J3800 Paralysis of vocal cords and larynx, unspecified: Secondary | ICD-10-CM | POA: Diagnosis present

## 2016-11-02 DIAGNOSIS — E785 Hyperlipidemia, unspecified: Secondary | ICD-10-CM | POA: Diagnosis present

## 2016-11-02 DIAGNOSIS — I480 Paroxysmal atrial fibrillation: Secondary | ICD-10-CM | POA: Diagnosis present

## 2016-11-02 DIAGNOSIS — N179 Acute kidney failure, unspecified: Secondary | ICD-10-CM | POA: Diagnosis present

## 2016-11-02 DIAGNOSIS — E43 Unspecified severe protein-calorie malnutrition: Secondary | ICD-10-CM | POA: Diagnosis present

## 2016-11-02 DIAGNOSIS — J383 Other diseases of vocal cords: Secondary | ICD-10-CM

## 2016-11-02 DIAGNOSIS — I9589 Other hypotension: Secondary | ICD-10-CM | POA: Diagnosis present

## 2016-11-02 DIAGNOSIS — R0603 Acute respiratory distress: Secondary | ICD-10-CM | POA: Diagnosis present

## 2016-11-02 DIAGNOSIS — E118 Type 2 diabetes mellitus with unspecified complications: Secondary | ICD-10-CM

## 2016-11-02 DIAGNOSIS — Z951 Presence of aortocoronary bypass graft: Secondary | ICD-10-CM | POA: Diagnosis not present

## 2016-11-02 DIAGNOSIS — F410 Panic disorder [episodic paroxysmal anxiety] without agoraphobia: Secondary | ICD-10-CM | POA: Diagnosis present

## 2016-11-02 DIAGNOSIS — R1310 Dysphagia, unspecified: Secondary | ICD-10-CM | POA: Diagnosis present

## 2016-11-02 DIAGNOSIS — J15211 Pneumonia due to Methicillin susceptible Staphylococcus aureus: Secondary | ICD-10-CM | POA: Diagnosis present

## 2016-11-02 DIAGNOSIS — N2581 Secondary hyperparathyroidism of renal origin: Secondary | ICD-10-CM | POA: Diagnosis present

## 2016-11-02 DIAGNOSIS — E1165 Type 2 diabetes mellitus with hyperglycemia: Secondary | ICD-10-CM | POA: Diagnosis present

## 2016-11-02 DIAGNOSIS — Z515 Encounter for palliative care: Secondary | ICD-10-CM | POA: Diagnosis present

## 2016-11-02 DIAGNOSIS — I219 Acute myocardial infarction, unspecified: Secondary | ICD-10-CM

## 2016-11-02 DIAGNOSIS — Z789 Other specified health status: Secondary | ICD-10-CM

## 2016-11-02 DIAGNOSIS — Z7901 Long term (current) use of anticoagulants: Secondary | ICD-10-CM

## 2016-11-02 DIAGNOSIS — Z952 Presence of prosthetic heart valve: Secondary | ICD-10-CM

## 2016-11-02 DIAGNOSIS — Z4659 Encounter for fitting and adjustment of other gastrointestinal appliance and device: Secondary | ICD-10-CM

## 2016-11-02 DIAGNOSIS — Z992 Dependence on renal dialysis: Secondary | ICD-10-CM

## 2016-11-02 DIAGNOSIS — J189 Pneumonia, unspecified organism: Secondary | ICD-10-CM | POA: Diagnosis present

## 2016-11-02 DIAGNOSIS — E876 Hypokalemia: Secondary | ICD-10-CM | POA: Diagnosis not present

## 2016-11-02 DIAGNOSIS — D638 Anemia in other chronic diseases classified elsewhere: Secondary | ICD-10-CM | POA: Diagnosis present

## 2016-11-02 DIAGNOSIS — I4892 Unspecified atrial flutter: Secondary | ICD-10-CM | POA: Diagnosis present

## 2016-11-02 DIAGNOSIS — I509 Heart failure, unspecified: Secondary | ICD-10-CM | POA: Diagnosis present

## 2016-11-02 DIAGNOSIS — J9601 Acute respiratory failure with hypoxia: Secondary | ICD-10-CM

## 2016-11-02 DIAGNOSIS — E119 Type 2 diabetes mellitus without complications: Secondary | ICD-10-CM

## 2016-11-02 DIAGNOSIS — Z430 Encounter for attention to tracheostomy: Secondary | ICD-10-CM

## 2016-11-02 DIAGNOSIS — I2101 ST elevation (STEMI) myocardial infarction involving left main coronary artery: Secondary | ICD-10-CM

## 2016-11-02 DIAGNOSIS — F411 Generalized anxiety disorder: Secondary | ICD-10-CM | POA: Diagnosis present

## 2016-11-02 DIAGNOSIS — Z7902 Long term (current) use of antithrombotics/antiplatelets: Secondary | ICD-10-CM

## 2016-11-02 DIAGNOSIS — N186 End stage renal disease: Secondary | ICD-10-CM

## 2016-11-02 DIAGNOSIS — IMO0002 Reserved for concepts with insufficient information to code with codable children: Secondary | ICD-10-CM

## 2016-11-02 DIAGNOSIS — Z931 Gastrostomy status: Secondary | ICD-10-CM

## 2016-11-02 DIAGNOSIS — Z794 Long term (current) use of insulin: Secondary | ICD-10-CM

## 2016-11-02 DIAGNOSIS — I340 Nonrheumatic mitral (valve) insufficiency: Secondary | ICD-10-CM | POA: Diagnosis present

## 2016-11-02 DIAGNOSIS — J9621 Acute and chronic respiratory failure with hypoxia: Secondary | ICD-10-CM | POA: Diagnosis present

## 2016-11-02 DIAGNOSIS — Z7189 Other specified counseling: Secondary | ICD-10-CM

## 2016-11-02 DIAGNOSIS — E1169 Type 2 diabetes mellitus with other specified complication: Secondary | ICD-10-CM | POA: Diagnosis present

## 2016-11-02 DIAGNOSIS — I2510 Atherosclerotic heart disease of native coronary artery without angina pectoris: Secondary | ICD-10-CM | POA: Diagnosis present

## 2016-11-02 DIAGNOSIS — A4101 Sepsis due to Methicillin susceptible Staphylococcus aureus: Secondary | ICD-10-CM | POA: Diagnosis present

## 2016-11-02 DIAGNOSIS — T380X5A Adverse effect of glucocorticoids and synthetic analogues, initial encounter: Secondary | ICD-10-CM | POA: Diagnosis present

## 2016-11-02 DIAGNOSIS — Y95 Nosocomial condition: Secondary | ICD-10-CM | POA: Diagnosis present

## 2016-11-02 DIAGNOSIS — Z79899 Other long term (current) drug therapy: Secondary | ICD-10-CM

## 2016-11-02 DIAGNOSIS — Z6824 Body mass index (BMI) 24.0-24.9, adult: Secondary | ICD-10-CM

## 2016-11-02 DIAGNOSIS — I132 Hypertensive heart and chronic kidney disease with heart failure and with stage 5 chronic kidney disease, or end stage renal disease: Secondary | ICD-10-CM | POA: Diagnosis present

## 2016-11-02 DIAGNOSIS — R7881 Bacteremia: Secondary | ICD-10-CM | POA: Diagnosis not present

## 2016-11-02 DIAGNOSIS — R9431 Abnormal electrocardiogram [ECG] [EKG]: Secondary | ICD-10-CM

## 2016-11-02 DIAGNOSIS — Z953 Presence of xenogenic heart valve: Secondary | ICD-10-CM

## 2016-11-02 DIAGNOSIS — T82898A Other specified complication of vascular prosthetic devices, implants and grafts, initial encounter: Secondary | ICD-10-CM | POA: Diagnosis not present

## 2016-11-02 DIAGNOSIS — E1122 Type 2 diabetes mellitus with diabetic chronic kidney disease: Secondary | ICD-10-CM | POA: Diagnosis present

## 2016-11-02 DIAGNOSIS — Z955 Presence of coronary angioplasty implant and graft: Secondary | ICD-10-CM

## 2016-11-02 DIAGNOSIS — B9561 Methicillin susceptible Staphylococcus aureus infection as the cause of diseases classified elsewhere: Secondary | ICD-10-CM

## 2016-11-02 HISTORY — DX: Disorder of kidney and ureter, unspecified: N28.9

## 2016-11-02 HISTORY — DX: Old myocardial infarction: I25.2

## 2016-11-02 LAB — CBC WITH DIFFERENTIAL/PLATELET
Basophils Absolute: 0.1 10*3/uL (ref 0.0–0.1)
Basophils Relative: 1 %
EOS ABS: 0.1 10*3/uL (ref 0.0–0.7)
Eosinophils Relative: 1 %
HEMATOCRIT: 36.4 % (ref 36.0–46.0)
HEMOGLOBIN: 11.3 g/dL — AB (ref 12.0–15.0)
LYMPHS ABS: 1.9 10*3/uL (ref 0.7–4.0)
Lymphocytes Relative: 14 %
MCH: 27.2 pg (ref 26.0–34.0)
MCHC: 31 g/dL (ref 30.0–36.0)
MCV: 87.7 fL (ref 78.0–100.0)
MONOS PCT: 10 %
Monocytes Absolute: 1.4 10*3/uL — ABNORMAL HIGH (ref 0.1–1.0)
NEUTROS PCT: 74 %
Neutro Abs: 10.5 10*3/uL — ABNORMAL HIGH (ref 1.7–7.7)
Platelets: 513 10*3/uL — ABNORMAL HIGH (ref 150–400)
RBC: 4.15 MIL/uL (ref 3.87–5.11)
RDW: 16.9 % — ABNORMAL HIGH (ref 11.5–15.5)
WBC: 13.9 10*3/uL — ABNORMAL HIGH (ref 4.0–10.5)

## 2016-11-02 LAB — MAGNESIUM: Magnesium: 1.7 mg/dL (ref 1.7–2.4)

## 2016-11-02 LAB — COMPREHENSIVE METABOLIC PANEL
ALK PHOS: 68 U/L (ref 38–126)
ALT: 19 U/L (ref 14–54)
ANION GAP: 18 — AB (ref 5–15)
AST: 28 U/L (ref 15–41)
Albumin: 2.8 g/dL — ABNORMAL LOW (ref 3.5–5.0)
BILIRUBIN TOTAL: 1 mg/dL (ref 0.3–1.2)
BUN: 7 mg/dL (ref 6–20)
CALCIUM: 8.9 mg/dL (ref 8.9–10.3)
CO2: 20 mmol/L — ABNORMAL LOW (ref 22–32)
Chloride: 95 mmol/L — ABNORMAL LOW (ref 101–111)
Creatinine, Ser: 1.99 mg/dL — ABNORMAL HIGH (ref 0.44–1.00)
GFR calc non Af Amer: 26 mL/min — ABNORMAL LOW (ref >60)
GFR, EST AFRICAN AMERICAN: 30 mL/min — AB (ref >60)
Glucose, Bld: 104 mg/dL — ABNORMAL HIGH (ref 65–99)
Potassium: 3.7 mmol/L (ref 3.5–5.1)
SODIUM: 133 mmol/L — AB (ref 135–145)
TOTAL PROTEIN: 7 g/dL (ref 6.5–8.1)

## 2016-11-02 LAB — I-STAT TROPONIN, ED: Troponin i, poc: 0.07 ng/mL (ref 0.00–0.08)

## 2016-11-02 LAB — PROTIME-INR
INR: 1.95
Prothrombin Time: 22.5 seconds — ABNORMAL HIGH (ref 11.4–15.2)

## 2016-11-02 LAB — GLUCOSE, CAPILLARY: GLUCOSE-CAPILLARY: 109 mg/dL — AB (ref 65–99)

## 2016-11-02 MED ORDER — ALBUTEROL SULFATE (2.5 MG/3ML) 0.083% IN NEBU
2.5000 mg | INHALATION_SOLUTION | Freq: Three times a day (TID) | RESPIRATORY_TRACT | Status: DC
  Administered 2016-11-02: 2.5 mg via RESPIRATORY_TRACT
  Filled 2016-11-02: qty 3

## 2016-11-02 MED ORDER — METOCLOPRAMIDE HCL 5 MG PO TABS
5.0000 mg | ORAL_TABLET | Freq: Two times a day (BID) | ORAL | Status: DC
  Administered 2016-11-03 – 2016-11-08 (×12): 5 mg via ORAL
  Filled 2016-11-02 (×13): qty 1

## 2016-11-02 MED ORDER — SODIUM CHLORIDE 0.9% FLUSH
3.0000 mL | Freq: Two times a day (BID) | INTRAVENOUS | Status: DC
  Administered 2016-11-02 – 2016-11-18 (×24): 3 mL via INTRAVENOUS
  Administered 2016-11-18: 10 mL via INTRAVENOUS
  Administered 2016-11-19 – 2016-11-24 (×12): 3 mL via INTRAVENOUS
  Administered 2016-11-25: 10 mL via INTRAVENOUS
  Administered 2016-11-25: 3 mL via INTRAVENOUS
  Administered 2016-11-26: 10 mL via INTRAVENOUS

## 2016-11-02 MED ORDER — INSULIN DETEMIR 100 UNIT/ML ~~LOC~~ SOLN
8.0000 [IU] | Freq: Every day | SUBCUTANEOUS | Status: DC
  Administered 2016-11-02 – 2016-11-04 (×3): 8 [IU] via SUBCUTANEOUS
  Filled 2016-11-02 (×4): qty 0.08

## 2016-11-02 MED ORDER — AMIODARONE HCL 200 MG PO TABS
200.0000 mg | ORAL_TABLET | Freq: Every day | ORAL | Status: DC
  Administered 2016-11-03 – 2016-11-10 (×8): 200 mg via ORAL
  Filled 2016-11-02 (×8): qty 1

## 2016-11-02 MED ORDER — PANTOPRAZOLE SODIUM 40 MG PO TBEC
40.0000 mg | DELAYED_RELEASE_TABLET | Freq: Two times a day (BID) | ORAL | Status: DC
  Administered 2016-11-02 – 2016-11-08 (×13): 40 mg via ORAL
  Filled 2016-11-02 (×13): qty 1

## 2016-11-02 MED ORDER — ALBUTEROL SULFATE (2.5 MG/3ML) 0.083% IN NEBU
2.5000 mg | INHALATION_SOLUTION | Freq: Once | RESPIRATORY_TRACT | Status: AC
  Administered 2016-11-02: 2.5 mg via RESPIRATORY_TRACT
  Filled 2016-11-02: qty 3

## 2016-11-02 MED ORDER — ATORVASTATIN CALCIUM 20 MG PO TABS
20.0000 mg | ORAL_TABLET | Freq: Every day | ORAL | Status: DC
  Administered 2016-11-03 – 2016-11-09 (×7): 20 mg via ORAL
  Filled 2016-11-02 (×6): qty 1

## 2016-11-02 MED ORDER — WARFARIN - PHARMACIST DOSING INPATIENT: Freq: Every day | Status: DC

## 2016-11-02 MED ORDER — WARFARIN SODIUM 4 MG PO TABS
4.0000 mg | ORAL_TABLET | ORAL | Status: AC
  Administered 2016-11-03: 4 mg via ORAL
  Filled 2016-11-02: qty 1

## 2016-11-02 MED ORDER — FLUTICASONE PROPIONATE 50 MCG/ACT NA SUSP
2.0000 | Freq: Every day | NASAL | Status: DC
  Administered 2016-11-03 – 2016-11-08 (×6): 2 via NASAL
  Filled 2016-11-02: qty 16

## 2016-11-02 MED ORDER — LORATADINE 10 MG PO TABS
10.0000 mg | ORAL_TABLET | Freq: Every day | ORAL | Status: DC
  Administered 2016-11-02 – 2016-11-08 (×7): 10 mg via ORAL
  Filled 2016-11-02 (×8): qty 1

## 2016-11-02 MED ORDER — METHYLPREDNISOLONE SODIUM SUCC 40 MG IJ SOLR
40.0000 mg | Freq: Two times a day (BID) | INTRAMUSCULAR | Status: DC
  Administered 2016-11-02 – 2016-11-06 (×9): 40 mg via INTRAVENOUS
  Filled 2016-11-02 (×9): qty 1

## 2016-11-02 MED ORDER — MIDODRINE HCL 5 MG PO TABS
10.0000 mg | ORAL_TABLET | Freq: Every morning | ORAL | Status: DC
  Administered 2016-11-03 – 2016-11-10 (×8): 10 mg via ORAL
  Filled 2016-11-02 (×9): qty 2

## 2016-11-02 MED ORDER — RENA-VITE PO TABS
1.0000 | ORAL_TABLET | Freq: Every day | ORAL | Status: DC
  Administered 2016-11-02 – 2016-11-25 (×23): 1 via ORAL
  Filled 2016-11-02 (×24): qty 1

## 2016-11-02 MED ORDER — CLOPIDOGREL BISULFATE 75 MG PO TABS
75.0000 mg | ORAL_TABLET | Freq: Every day | ORAL | Status: DC
  Administered 2016-11-03 – 2016-11-10 (×7): 75 mg via ORAL
  Filled 2016-11-02 (×7): qty 1

## 2016-11-02 MED ORDER — ALPRAZOLAM 0.25 MG PO TABS
0.2500 mg | ORAL_TABLET | Freq: Two times a day (BID) | ORAL | Status: DC | PRN
  Administered 2016-11-02 – 2016-11-07 (×6): 0.25 mg via ORAL
  Filled 2016-11-02 (×6): qty 1

## 2016-11-02 NOTE — Telephone Encounter (Signed)
First attempt to reach Deborah [name] for Transitional Care call. No answer on home or mobile and daughters number is the same. No ability to leave voicemail.

## 2016-11-02 NOTE — ED Notes (Signed)
Pt was discharged from hosp with neb tx's but has not been able to get them to work at home

## 2016-11-02 NOTE — Telephone Encounter (Signed)
error 

## 2016-11-02 NOTE — Telephone Encounter (Signed)
2nd attempt made to contact patient, talked to patients daughter, stated patient was currently in dialysis and will pass message to contact us back for transitional care

## 2016-11-02 NOTE — H&P (Signed)
Name: Deborah [name] MRN: [id number] DOB: [birth year]    ADMISSION DATE:  11/02/2016 CONSULTATION DATE:  11/02/2016  REFERRING MD :  Dr. Regenia Skeeter EDP  CHIEF COMPLAINT:  SOB  BRIEF PATIENT DESCRIPTION: 60 year old female with recent prolonged hospitalization secondary to papillary muscle rupture and subsequent HCAP. She was intubated requiring trach. Decannulated in the end of October. She then went to [facility] and was discharged 11/29. No presenting to [facility] ED 11/30 with compalints of SOB x1-2 weeks. Worse last 24 hours.   SIGNIFICANT EVENTS  09/12 Admit to [facility] 09/15 To [facility] 09/20 MVR with bioprosthetic valve 09/28 Permcath for HD 09/29 Extubated 09/30 Reintubated 10/05 Trach by Hyman Bible 10/09 Off vent 10/25 decannulated 11/8 to CIR 11/29 DC CIR 11/30 admit for resp distress  STUDIES:    HISTORY OF PRESENT ILLNESS:  60 year old female with past medical history as below, which is significant for diabetes, hypertension, and end-stage renal disease on dialysis TTS. She was recently admitted to [facility] 9/12 for an STEMI. Hospital course thing greatly complicated by acute renal failure requiring CRRT. She then developed neurogenic shock secondary to ruptured papillary muscle and underwent repair 9/21 by Dr. Prescott Gum. She required a prolonged ICU stay with mechanical ventilation and vasoactive infusions. Also complicated by atrial flutter and underwent cardioversion 10/2. Underwent tracheostomy 10/5 and was eventually weaned and decannulated 10/25. She was transferred to [facility] for further rehabilitation where she was slow to progress but it was eventually discharged 11/29. Family states she has been having some breathing difficulty for the last week even while she was in CIR. She has had difficulty taking deep breaths, as it makes her feel so she has to cough. She has also had trouble lying down flat to sleep. This is somewhat worsened over the last day since returning home. She had her first outpatient  dialysis treatment 10/30 and staff there did not like the way that she was breathing. For that reason I sent her to the emergency department for further evaluation. Laboratory and radiographic workup in the emergency department overall is nonspecific. PCCM asked to see.  PAST MEDICAL HISTORY :   has a past medical history of Diabetes mellitus; Hyperlipidemia; Hypertension; MI, old; and Renal disorder.  has a past surgical history that includes Vaginal delivery; Cardiac catheterization (N/A, 08/15/2016); Cardiac catheterization (N/A, 08/15/2016); Cardiac catheterization (N/A, 08/18/2016); Cardiac catheterization (N/A, 08/18/2016); Mitral valve repair (N/A, 08/23/2016); Intraoprative transesophageal echocardiogram (N/A, 08/23/2016); Endoharvest vein of greater saphenous vein (Left, 08/23/2016); Cardiac catheterization (Right, 08/23/2016); Insertion of dialysis catheter (Bilateral, 08/31/2016); AV fistula placement (Left, 10/02/2016); and tracheostomy reversal. Prior to Admission medications   Medication Sig Start Date End Date Taking? Authorizing Provider  albuterol (PROVENTIL) (2.5 MG/3ML) 0.083% nebulizer solution Take 3 mLs (2.5 mg total) by nebulization 4 (four) times daily - after meals and at bedtime. 11/01/16   Bary Leriche, PA-C  ALPRAZolam (XANAX) 0.25 MG tablet Take 1 tablet (0.25 mg total) by mouth 2 (two) times daily as needed for anxiety. 11/01/16   Bary Leriche, PA-C  amiodarone (PACERONE) 200 MG tablet Take 1 tablet (200 mg total) by mouth daily. 11/01/16   Ivan Anchors Love, PA-C  atorvastatin (LIPITOR) 20 MG tablet Take 1 tablet (20 mg total) by mouth daily. 11/01/16   Bary Leriche, PA-C  blood glucose meter kit and supplies KIT Dispense based on patient and insurance preference. Use up to four times daily as directed. (FOR ICD-9 250.00, 250.01). 11/01/16   Bary Leriche, PA-C  cefdinir (OMNICEF) 125 MG/5ML suspension Take 12 mLs (300 mg total) by mouth every other day. 11/02/16   Bary Leriche,  PA-C  clopidogrel (PLAVIX) 75 MG tablet Take 1 tablet (75 mg total) by mouth daily. 11/01/16   Bary Leriche, PA-C  diphenhydrAMINE (BENADRYL) 12.5 MG/5ML liquid Take 5 mLs (12.5 mg total) by mouth daily as needed (before dialysis). 10/06/16   Elgie Collard, PA-C  fluticasone (FLONASE) 50 MCG/ACT nasal spray Place 2 sprays into both nostrils daily. 11/02/16   Bary Leriche, PA-C  guaiFENesin (ROBITUSSIN) 100 MG/5ML SOLN Take 30 mLs (600 mg total) by mouth every 6 (six) hours. 11/01/16   Bary Leriche, PA-C  hydrocortisone (ANUSOL-HC) 2.5 % rectal cream Place rectally 2 (two) times daily as needed for hemorrhoids or itching. 11/01/16   Bary Leriche, PA-C  Hydrocortisone (GERHARDT'S BUTT CREAM) CREA Apply 1 application topically 2 (two) times daily. 10/06/16   Elgie Collard, PA-C  Insulin Detemir (LEVEMIR) 100 UNIT/ML Pen Inject 8 Units into the skin daily at 10 pm. 11/01/16   Ivan Anchors Love, PA-C  lanthanum (FOSRENOL) 1000 MG chewable tablet Chew 1 tablet (1,000 mg total) by mouth 3 (three) times daily with meals. 11/01/16 12/01/16  Bary Leriche, PA-C  metoCLOPramide (REGLAN) 5 MG tablet Take 1 tablet (5 mg total) by mouth 2 (two) times daily with a meal. 11/01/16   Ivan Anchors Love, PA-C  midodrine (PROAMATINE) 10 MG tablet Take 1 tablet (10 mg total) by mouth every morning. 11/02/16   Bary Leriche, PA-C  multivitamin (RENA-VIT) TABS tablet Take 1 tablet by mouth at bedtime. 11/01/16 12/01/16  Ivan Anchors Love, PA-C  NEEDLE, DISP, 30 G (BD DISP NEEDLES) 30G X 1/2" MISC 1 application by Does not apply route at bedtime. 11/01/16   Ivan Anchors Love, PA-C  pantoprazole (PROTONIX) 40 MG tablet Take 1 tablet (40 mg total) by mouth 2 (two) times daily. 11/01/16   Bary Leriche, PA-C  warfarin (COUMADIN) 2 MG tablet Take 2 mg on Tue, Thu, Sat, Sun 11/01/16   Ivan Anchors Love, PA-C  warfarin (COUMADIN) 4 MG tablet Take 4 mg on Mon, Wed, Fri with supper 11/01/16   Ivan Anchors Love, PA-C   No Known Allergies  FAMILY  HISTORY:  family history includes Breast cancer in her sister; Diabetes in her mother; Hypertension in her mother. SOCIAL HISTORY:  reports that she has never smoked. She has never used smokeless tobacco. She reports that she does not drink alcohol or use drugs.  REVIEW OF SYSTEMS:   Bolds are positive  Constitutional: weight loss, gain, night sweats, Fevers, chills, fatigue .  HEENT: headaches, Sore throat, sneezing, nasal congestion, post nasal drip, Difficulty swallowing, Tooth/dental problems, visual complaints visual changes, ear ache CV:  chest pain, radiates: ,Orthopnea, PND, swelling in lower extremities, dizziness, palpitations, syncope.  GI  heartburn, indigestion, abdominal pain, nausea, vomiting, diarrhea, change in bowel habits, loss of appetite, bloody stools.  Resp: cough, productive: , hemoptysis, dyspnea, chest pain, pleuritic.  Skin: rash or itching or icterus GU: dysuria, change in color of urine, urgency or frequency. flank pain, hematuria  MS: joint pain or swelling. decreased range of motion  Psych: change in mood or affect. depression or anxiety.  Neuro: difficulty with speech, weakness, numbness, ataxia    SUBJECTIVE:   VITAL SIGNS: Temp:  [98.7 F (37.1 C)] 98.7 F (37.1 C) (11/30 1801) Pulse Rate:  [95-109] 101 (11/30 2015) Resp:  [19-38] 25 (11/30 2015)  BP: (101-129)/(64-74) 114/66 (11/30 2015) SpO2:  [93 %-100 %] 93 % (11/30 2015) Weight:  [55.3 kg (122 lb)] 55.3 kg (122 lb) (11/30 1803)  PHYSICAL EXAMINATION: General:  Chronically ill appearing female Neuro:  Alert, oriented, non-focal HEENT:  [place]/AT, PERRL, no JVD Cardiovascular:  RRR, no MRG Lungs:  Insp/exp wheeze, also transmitted upper airway rasp, no true stridor.  Abdomen:  No acute deformity or ROM limitation Musculoskeletal:  No acute deformity or ROM limitation Skin:  Grossly intact   Recent Labs Lab 10/31/16 1804  NA 130*  K 3.1*  CL 93*  CO2 24  BUN 48*  CREATININE 5.47*    GLUCOSE 345*    Recent Labs Lab 10/30/16 0606 10/31/16 1803 11/02/16 1810  HGB 10.6* 9.6* 11.3*  HCT 33.6* 31.1* 36.4  WBC 13.3* 10.3 13.9*  PLT 365 463* 513*   Dg Chest 2 View  Result Date: 11/01/2016 CLINICAL DATA:  Wheezing, shortness of breath, cough. EXAM: CHEST  2 VIEW COMPARISON:  Radiographs of October 30, 2016. FINDINGS: Stable cardiomegaly. Status post cardiac valve repair. Left internal jugular dialysis catheter is unchanged in position. No pneumothorax or pleural effusion is noted. No acute pulmonary disease is noted. Bony thorax is unremarkable. IMPRESSION: No active cardiopulmonary disease. Electronically Signed   By: Marijo Conception, M.D.   On: 11/01/2016 07:25   Dg Chest Portable 1 View  Result Date: 11/02/2016 CLINICAL DATA:  Shortness of breath and tachypnea EXAM: PORTABLE CHEST 1 VIEW COMPARISON:  Yesterday FINDINGS: Dialysis catheter on the left with tip at the upper right atrium. Chronic cardiomegaly, status post mitral valve replacement. There is no edema, consolidation, effusion, or pneumothorax. IMPRESSION: No evidence of acute disease. Electronically Signed   By: Monte Fantasia M.D.   On: 11/02/2016 18:29    ASSESSMENT / PLAN:  Respiratory distress - recent prolonged intubation/tracheostomy now s/p decannulation about one month ago. This was worked up in rehab and CT did not show any tracheal stenosis. She was started on cough meds and GERD therapy maximized without improvement. She is also prescribed nebs that she has not been able to take since returning home, but has never smoked and carries no COPD diagnosis. Potentially could still have some upper airway trauma. Possibly a component of anxiety or allergies as well.  Plan: -Supplemental O2 as needed to keep O2 sats > 92% -Solumedrol 81m q 12 hours -Flonase, Claritin -Scheduled and PRN bronchodilators -Incentive spirometry -May need ENT to evaluate her airway if not improved -Low dose morphine for  WOB (0.543mq 6 hours)  Paroxysmal atrial fibrillation Chronic hypotension CAD, MVR -Continue amiodarone, may not be best choice with dyspnea, but no change on CXR so continue for now -Telemetry monitoring -Continue warfarin per home regimen, assess INR -Continue plavix, midodrine, statin  DM - CBG monitoring ACHS - Continue home dose insulin with sliding scale (Levemir 8 units daily)  GERD - Continue pantoprazole BID  Leukocytosis - afebrile, no obvious s/s infection - Monitor  Diet Heart healthy VTE ppx: on warfarin Admit SDU  PaGeorgann HousekeeperAGACNP-BC [facility]ulmonology/Critical Care Pager [phone] ([phone]/30/2017 9:12 PM

## 2016-11-02 NOTE — ED Provider Notes (Signed)
[facility] DEPT Provider Note   CSN: [id number] Arrival date & time: 11/02/16  1752     History   Chief Complaint Chief Complaint  Patient presents with  . Shortness of Breath    HPI Deborah [name] is a 60 y.o. female.  HPI  60 year old female presents with shortness of breath from dialysis. Her dialysis center center here because they states she was not breathing right. Patient states she's been having issues like this for the past 2 weeks. She was admitted to this and an outside hospital for 2 months after an MI but turned into renal failure requiring dialysis as well as intubation requiring trach. The trach is no longer in. She has a lot of congestion and cough and feels like she has short of breath at the level of her neck. No chest pain. Frequently breathes fast but feels like she can't control it.  Past Medical History:  Diagnosis Date  . Diabetes mellitus   . Hyperlipidemia   . Hypertension   . MI, old   . Renal disorder    dialysls    Patient Active Problem List   Diagnosis Date Noted  . Respiratory distress 11/02/2016  . Chronic anticoagulation   . Acute lower UTI   . Labile blood glucose   . Leukocytosis   . Subtherapeutic international normalized ratio (INR)   . Supratherapeutic INR   . Cough   . Oropharyngeal dysphagia   . Uncontrolled type 2 diabetes mellitus with complication, with long-term current use of insulin ([place])   . Physical debility 10/11/2016  . Metabolic encephalopathy 83/15/1761  . Debility   . ESRD on dialysis ([place])   . Type 2 diabetes mellitus with complication, with long-term current use of insulin ([place])   . Anemia of chronic disease   . Coronary artery disease involving native coronary artery of native heart without angina pectoris   . Acute on chronic respiratory failure with hypoxia ([place])   . Atrial flutter ([place])   . Persistent cough   . Dysphonia   . Sleep disturbance   . Diabetes mellitus type 2 in nonobese ([name])   . Stage  3 chronic kidney disease   . NSTEMI (non-ST elevated myocardial infarction) ([place])   . Tachypnea   . Hyponatremia   . Lymphocytosis   . Acute blood loss anemia   . Pressure injury of skin 09/18/2016  . Tracheostomy status ([place])   . Delirium   . Encounter for attention to tracheostomy ([place])   . History of MVR with cardiopulmonary bypass   . Systolic congestive heart failure ([place])   . HCAP (healthcare-associated pneumonia)   . PAF (paroxysmal atrial fibrillation) ([place])   . Acute systolic congestive heart failure ([place])   . AKI (acute kidney injury) ([place])   . Abnormal LFTs   . S/P MVR (mitral valve replacement)   . Mitral regurgitation 08/23/2016  . Acute on chronic diastolic heart failure ([place]) 08/18/2016  . Acute pulmonary edema ([name])   . Respiratory failure ([name]) 08/17/2016  . Cardiogenic shock ([place]) 08/17/2016  . Acute hypoxemic respiratory failure ([place])   . STEMI (ST elevation myocardial infarction) ([place]) 08/15/2016  . Noncompliance 01/25/2015  . Bereavement 07/09/2014  . Low back pain 02/17/2014  . Cervical cancer screening 11/20/2013  . Routine general medical examination at a health care facility 10/14/2013  . Chronic kidney disease, stage 3 06/14/2012  . Hypertension 03/08/2012  . Diabetes mellitus type 2, controlled 03/08/2012  . Hyperlipidemia 03/08/2012    Past  Surgical History:  Procedure Laterality Date  . AV FISTULA PLACEMENT Left 10/02/2016   Procedure: INSERTION OF ARTERIOVENOUS (AV) GORE-TEX GRAFT ARM;  Surgeon: Waynetta Sandy, MD;  Location: [facility];  Service: Vascular;  Laterality: Left;  . CARDIAC CATHETERIZATION N/A 08/15/2016   Procedure: Left Heart Cath and Coronary Angiography;  Surgeon: Yolonda Kida, MD;  Location: [facility] CV LAB;  Service: Cardiovascular;  Laterality: N/A;  . CARDIAC CATHETERIZATION N/A 08/15/2016   Procedure: Coronary Stent Intervention;  Surgeon: Yolonda Kida, MD;  Location: [facility] [facility];  Service:  Cardiovascular;  Laterality: N/A;  . CARDIAC CATHETERIZATION N/A 08/18/2016   Procedure: Right Heart Cath;  Surgeon: Jolaine Artist, MD;  Location: [facility] CV LAB;  Service: Cardiovascular;  Laterality: N/A;  . CARDIAC CATHETERIZATION N/A 08/18/2016   Procedure: IABP Insertion;  Surgeon: Jolaine Artist, MD;  Location: [facility] CV LAB;  Service: Cardiovascular;  Laterality: N/A;  . CARDIAC CATHETERIZATION Right 08/23/2016   Procedure: CENTRAL LINE INSERTION RIGHT SUBCLAVIAN;  Surgeon: Ivin Poot, MD;  Location: [facility];  Service: Open Heart Surgery;  Laterality: Right;  . ENDOVEIN HARVEST OF GREATER SAPHENOUS VEIN Left 08/23/2016   Procedure: ENDOVEIN HARVEST OF GREATER SAPHENOUS VEIN;  Surgeon: Ivin Poot, MD;  Location: [facility];  Service: Open Heart Surgery;  Laterality: Left;  . INSERTION OF DIALYSIS CATHETER Bilateral 08/31/2016   Procedure: INSERTION OF DIALYSIS CATHETER LEFT INTERNAL JUGULAR VEIN & INSERTION OF TRIPLE LUMEN RIGHT INTERNAL JUGULAR VEIN;  Surgeon: Angelia Mould, MD;  Location: [facility];  Service: Vascular;  Laterality: Bilateral;  . INTRAOPERATIVE TRANSESOPHAGEAL ECHOCARDIOGRAM N/A 08/23/2016   Procedure: INTRAOPERATIVE TRANSESOPHAGEAL ECHOCARDIOGRAM;  Surgeon: Ivin Poot, MD;  Location: [facility];  Service: Open Heart Surgery;  Laterality: N/A;  . MITRAL VALVE REPAIR N/A 08/23/2016   Procedure: MITRAL VALVE REPAIR (MVR) USING 25MM EDWARDS MAGNA EASE BIOPROSTHESIS MITRAL  VALVE;  Surgeon: Ivin Poot, MD;  Location: [facility];  Service: Open Heart Surgery;  Laterality: N/A;  . tracheostomy reversal    . VAGINAL DELIVERY     x 6    OB History    No data available       Home Medications    Prior to Admission medications   Medication Sig Start Date End Date Taking? Authorizing Provider  guaiFENesin (ROBITUSSIN) 100 MG/5ML SOLN Take 30 mLs (600 mg total) by mouth every 6 (six) hours. 11/01/16  Yes Ivan Anchors Love, PA-C  albuterol (PROVENTIL) (2.5  MG/3ML) 0.083% nebulizer solution Take 3 mLs (2.5 mg total) by nebulization 4 (four) times daily - after meals and at bedtime. 11/01/16   Bary Leriche, PA-C  ALPRAZolam (XANAX) 0.25 MG tablet Take 1 tablet (0.25 mg total) by mouth 2 (two) times daily as needed for anxiety. 11/01/16   Bary Leriche, PA-C  amiodarone (PACERONE) 200 MG tablet Take 1 tablet (200 mg total) by mouth daily. 11/01/16   Ivan Anchors Love, PA-C  atorvastatin (LIPITOR) 20 MG tablet Take 1 tablet (20 mg total) by mouth daily. 11/01/16   Bary Leriche, PA-C  cefdinir (OMNICEF) 125 MG/5ML suspension Take 12 mLs (300 mg total) by mouth every other day. 11/02/16   Bary Leriche, PA-C  clopidogrel (PLAVIX) 75 MG tablet Take 1 tablet (75 mg total) by mouth daily. 11/01/16   Bary Leriche, PA-C  hydrocortisone (ANUSOL-HC) 2.5 % rectal cream Place rectally 2 (two) times daily as needed for hemorrhoids or itching. 11/01/16  Bary Leriche, PA-C  Insulin Detemir (LEVEMIR) 100 UNIT/ML Pen Inject 8 Units into the skin daily at 10 pm. 11/01/16   Ivan Anchors Love, PA-C  lanthanum (FOSRENOL) 1000 MG chewable tablet Chew 1 tablet (1,000 mg total) by mouth 3 (three) times daily with meals. 11/01/16 12/01/16  Bary Leriche, PA-C  metoCLOPramide (REGLAN) 5 MG tablet Take 1 tablet (5 mg total) by mouth 2 (two) times daily with a meal. 11/01/16   Ivan Anchors Love, PA-C  midodrine (PROAMATINE) 10 MG tablet Take 1 tablet (10 mg total) by mouth every morning. 11/02/16   Bary Leriche, PA-C  multivitamin (RENA-VIT) TABS tablet Take 1 tablet by mouth at bedtime. 11/01/16 12/01/16  Ivan Anchors Love, PA-C  pantoprazole (PROTONIX) 40 MG tablet Take 1 tablet (40 mg total) by mouth 2 (two) times daily. 11/01/16   Bary Leriche, PA-C  warfarin (COUMADIN) 2 MG tablet Take 2 mg on Tue, Thu, Sat, Sun 11/01/16   Ivan Anchors Love, PA-C  warfarin (COUMADIN) 4 MG tablet Take 4 mg on Mon, Wed, Fri with supper 11/01/16   Bary Leriche, PA-C    Family History Family History    Problem Relation Age of Onset  . Hypertension Mother   . Diabetes Mother   . Breast cancer Sister     Social History Social History  Substance Use Topics  . Smoking status: Never Smoker  . Smokeless tobacco: Never Used  . Alcohol use No     Allergies   Patient has no known allergies.   Review of Systems Review of Systems  Constitutional: Negative for fever.  HENT: Positive for congestion.   Respiratory: Positive for cough and shortness of breath.   Cardiovascular: Negative for chest pain.  All other systems reviewed and are negative.    Physical Exam Updated Vital Signs BP 117/66   Pulse 101   Temp 98.7 F (37.1 C) (Oral)   Resp 23   Ht 5\' 2"  (1.575 m)   Wt 122 lb (55.3 kg)   SpO2 96%   BMI 22.31 kg/m   Physical Exam  Constitutional: She is oriented to person, place, and time. She appears well-developed and well-nourished.  HENT:  Head: Normocephalic and atraumatic.  Right Ear: External ear normal.  Left Ear: External ear normal.  Nose: Nose normal.  Eyes: Right eye exhibits no discharge. Left eye exhibits no discharge.  Neck: Neck supple.  Cardiovascular: Normal rate, regular rhythm and normal heart sounds.   Pulmonary/Chest: Breath sounds normal. No stridor. Tachypnea noted.  Abdominal: Soft. There is no tenderness.  Neurological: She is alert and oriented to person, place, and time.  Skin: Skin is warm and dry.  Nursing note and vitals reviewed.    ED Treatments / Results  Labs (all labs ordered are listed, but only abnormal results are displayed) Labs Reviewed  CBC WITH DIFFERENTIAL/PLATELET - Abnormal; Notable for the following:       Result Value   WBC 13.9 (*)    Hemoglobin 11.3 (*)    RDW 16.9 (*)    Platelets 513 (*)    Neutro Abs 10.5 (*)    Monocytes Absolute 1.4 (*)    All other components within normal limits  COMPREHENSIVE METABOLIC PANEL - Abnormal; Notable for the following:    Sodium 133 (*)    Chloride 95 (*)    CO2 20  (*)    Glucose, Bld 104 (*)    Creatinine, Ser 1.99 (*)    Albumin  2.8 (*)    GFR calc non Af Amer 26 (*)    GFR calc Af Amer 30 (*)    Anion gap 18 (*)    All other components within normal limits  PROTIME-INR - Abnormal; Notable for the following:    Prothrombin Time 22.5 (*)    All other components within normal limits  GLUCOSE, CAPILLARY - Abnormal; Notable for the following:    Glucose-Capillary 109 (*)    All other components within normal limits  MAGNESIUM  BASIC METABOLIC PANEL  PROTIME-INR  CBC  I-STAT TROPOININ, ED    EKG  EKG Interpretation  Date/Time:  Thursday November 02 2016 17:58:20 EST Ventricular Rate:  107 PR Interval:    QRS Duration: 90 QT Interval:  500 QTC Calculation: 667 R Axis:   56 Text Interpretation:  ** Critical Test Result: Long QTc Accelerated Junctional rhythm Nonspecific ST and T wave abnormality Prolonged QT Abnormal ECG Confirmed by Cira Deyoe MD, Kalen Neidert [phone]) on 11/02/2016 6:22:46 PM       Radiology Dg Chest 2 View  Result Date: 11/01/2016 CLINICAL DATA:  Wheezing, shortness of breath, cough. EXAM: CHEST  2 VIEW COMPARISON:  Radiographs of October 30, 2016. FINDINGS: Stable cardiomegaly. Status post cardiac valve repair. Left internal jugular dialysis catheter is unchanged in position. No pneumothorax or pleural effusion is noted. No acute pulmonary disease is noted. Bony thorax is unremarkable. IMPRESSION: No active cardiopulmonary disease. Electronically Signed   By: Marijo Conception, M.D.   On: 11/01/2016 07:25   Dg Chest Portable 1 View  Result Date: 11/02/2016 CLINICAL DATA:  Shortness of breath and tachypnea EXAM: PORTABLE CHEST 1 VIEW COMPARISON:  Yesterday FINDINGS: Dialysis catheter on the left with tip at the upper right atrium. Chronic cardiomegaly, status post mitral valve replacement. There is no edema, consolidation, effusion, or pneumothorax. IMPRESSION: No evidence of acute disease. Electronically Signed   By: Monte Fantasia M.D.   On: 11/02/2016 18:29    Procedures Procedures (including critical care time)  Medications Ordered in ED Medications  sodium chloride flush (NS) 0.9 % injection 3 mL (3 mLs Intravenous Given 11/02/16 2323)  albuterol (PROVENTIL) (2.5 MG/3ML) 0.083% nebulizer solution 2.5 mg (2.5 mg Nebulization Given 11/02/16 2322)  ALPRAZolam (XANAX) tablet 0.25 mg (0.25 mg Oral Given 11/02/16 2322)  amiodarone (PACERONE) tablet 200 mg (not administered)  atorvastatin (LIPITOR) tablet 20 mg (not administered)  clopidogrel (PLAVIX) tablet 75 mg (not administered)  fluticasone (FLONASE) 50 MCG/ACT nasal spray 2 spray (not administered)  insulin detemir (LEVEMIR) injection 8 Units (8 Units Subcutaneous Given 11/02/16 2323)  metoCLOPramide (REGLAN) tablet 5 mg (not administered)  midodrine (PROAMATINE) tablet 10 mg (not administered)  multivitamin (RENA-VIT) tablet 1 tablet (1 tablet Oral Given 11/02/16 2322)  pantoprazole (PROTONIX) EC tablet 40 mg (40 mg Oral Given 11/02/16 2322)  loratadine (CLARITIN) tablet 10 mg (10 mg Oral Given 11/02/16 2322)  methylPREDNISolone sodium succinate (SOLU-MEDROL) 40 mg/mL injection 40 mg (40 mg Intravenous Given 11/02/16 2323)  albuterol (PROVENTIL) (2.5 MG/3ML) 0.083% nebulizer solution 2.5 mg (2.5 mg Nebulization Given 11/02/16 1852)     Initial Impression / Assessment and Plan / ED Course  I have reviewed the triage vital signs and the nursing notes.  Pertinent labs & imaging results that were available during my care of the patient were reviewed by me and considered in my medical decision making (see chart for details).  Clinical Course     I think there is probably an anxiety component but also  possibly some swelling and/or congestion near her trachea. Given she was just intubated and then had a trach and she is to be observed and have likely a scope. Pulmonology/critical care has seen and will admit for steroids and further breathing treatments. Of  note patient does have a prolonged QTC but is asymptomatic at this time with benign electrolytes.  Final Clinical Impressions(s) / ED Diagnoses   Final diagnoses:  Respiratory distress  Prolonged Q-T interval on ECG    New Prescriptions Current Discharge Medication List       Sherwood Gambler, MD 11/02/16 [phone]

## 2016-11-02 NOTE — ED Triage Notes (Signed)
Pt sent here from dialysis b/c they didn't like the way she was breathing.  She did complete her dialysis.  Pt was discharged last night breathing heavily.  States she has to sit up at night and she can't sleep.  Pt states no improvement of sob after dialysis.  Able to speak in full sentences.  Denies chest.

## 2016-11-02 NOTE — Progress Notes (Signed)
ANTICOAGULATION CONSULT NOTE - Initial Consult  Pharmacy Consult for Coumadin Indication: atrial fibrillation  No Known Allergies  Patient Measurements: Height: 5\' 2"  (157.5 cm) Weight: 122 lb (55.3 kg) IBW/kg (Calculated) : 50.1  Vital Signs: Temp: 98.7 F (37.1 C) (11/30 1801) Temp Source: Oral (11/30 1801) BP: 117/66 (11/30 2130) Pulse Rate: 101 (11/30 2130)  Labs:  Recent Labs  10/31/16 0647 10/31/16 1803 10/31/16 1804 11/01/16 0514 11/02/16 1810 11/02/16 2239  HGB  --  9.6*  --   --  11.3*  --   HCT  --  31.1*  --   --  36.4  --   PLT  --  463*  --   --  513*  --   LABPROT 27.6*  --   --  26.0*  --  22.5*  INR 2.52  --   --  2.33  --  1.95  CREATININE  --   --  5.47*  --  1.99*  --     Estimated Creatinine Clearance: 24.1 mL/min (by C-G formula based on SCr of 1.99 mg/dL (H)).   Medical History: Past Medical History:  Diagnosis Date  . Diabetes mellitus   . Hyperlipidemia   . Hypertension   . MI, old   . Renal disorder    dialysls    Medications:  Prescriptions Prior to Admission  Medication Sig Dispense Refill Last Dose  . guaiFENesin (ROBITUSSIN) 100 MG/5ML SOLN Take 30 mLs (600 mg total) by mouth every 6 (six) hours. 3000 mL 0 11/01/2016 at Unknown time  . albuterol (PROVENTIL) (2.5 MG/3ML) 0.083% nebulizer solution Take 3 mLs (2.5 mg total) by nebulization 4 (four) times daily - after meals and at bedtime. 150 mL 1 not startec  . ALPRAZolam (XANAX) 0.25 MG tablet Take 1 tablet (0.25 mg total) by mouth 2 (two) times daily as needed for anxiety. 30 tablet 0 not started  . amiodarone (PACERONE) 200 MG tablet Take 1 tablet (200 mg total) by mouth daily. 30 tablet 0 not started  . atorvastatin (LIPITOR) 20 MG tablet Take 1 tablet (20 mg total) by mouth daily. 30 tablet 0 not started  . cefdinir (OMNICEF) 125 MG/5ML suspension Take 12 mLs (300 mg total) by mouth every other day. 48 mL 0 not started  . clopidogrel (PLAVIX) 75 MG tablet Take 1 tablet (75  mg total) by mouth daily. 30 tablet 0 not started  . hydrocortisone (ANUSOL-HC) 2.5 % rectal cream Place rectally 2 (two) times daily as needed for hemorrhoids or itching. 30 g 0 not started  . Insulin Detemir (LEVEMIR) 100 UNIT/ML Pen Inject 8 Units into the skin daily at 10 pm. 15 mL 0 not started  . lanthanum (FOSRENOL) 1000 MG chewable tablet Chew 1 tablet (1,000 mg total) by mouth 3 (three) times daily with meals. 90 tablet 0 not started  . metoCLOPramide (REGLAN) 5 MG tablet Take 1 tablet (5 mg total) by mouth 2 (two) times daily with a meal. 28 tablet 0 not started  . midodrine (PROAMATINE) 10 MG tablet Take 1 tablet (10 mg total) by mouth every morning. 30 tablet 0 not started  . multivitamin (RENA-VIT) TABS tablet Take 1 tablet by mouth at bedtime. 30 tablet 0 not started  . pantoprazole (PROTONIX) 40 MG tablet Take 1 tablet (40 mg total) by mouth 2 (two) times daily. 60 tablet 0 not started  . warfarin (COUMADIN) 2 MG tablet Take 2 mg on Tue, Thu, Sat, Sun 30 tablet 0 not started  .  warfarin (COUMADIN) 4 MG tablet Take 4 mg on Mon, Wed, Fri with supper 30 tablet 0 not started    Assessment: 60 y.o. F presents with SOB.Pt on coumadin PTA. Noted pt with recent prolonged hospitalization (d/c 11/29 from rehab). D/c home on coumadin 4mg  M/W/F and 2mg  T/T/S/S. Pt did not take today. Admission INR 1.95 (just slightly subtherapeutic). CBC stable on admission.  Goal of Therapy:  INR 2-3 Monitor platelets by anticoagulation protocol: Yes   Plan:  Daily INR Coumadin 4mg  po now  Sherlon Handing, PharmD, BCPS Clinical pharmacist, pager [phone] 11/02/2016,11:35 PM

## 2016-11-03 DIAGNOSIS — R0600 Dyspnea, unspecified: Secondary | ICD-10-CM

## 2016-11-03 DIAGNOSIS — J398 Other specified diseases of upper respiratory tract: Secondary | ICD-10-CM

## 2016-11-03 LAB — RENAL FUNCTION PANEL
ALBUMIN: 3.1 g/dL — AB (ref 3.5–5.0)
Anion gap: 25 — ABNORMAL HIGH (ref 5–15)
BUN: 13 mg/dL (ref 6–20)
CALCIUM: 9.3 mg/dL (ref 8.9–10.3)
CHLORIDE: 95 mmol/L — AB (ref 101–111)
CO2: 17 mmol/L — ABNORMAL LOW (ref 22–32)
CREATININE: 3.01 mg/dL — AB (ref 0.44–1.00)
GFR calc Af Amer: 18 mL/min — ABNORMAL LOW (ref >60)
GFR, EST NON AFRICAN AMERICAN: 16 mL/min — AB (ref >60)
Glucose, Bld: 181 mg/dL — ABNORMAL HIGH (ref 65–99)
Phosphorus: 4.6 mg/dL (ref 2.5–4.6)
Potassium: 3.7 mmol/L (ref 3.5–5.1)
SODIUM: 137 mmol/L (ref 135–145)

## 2016-11-03 LAB — GLUCOSE, CAPILLARY
GLUCOSE-CAPILLARY: 251 mg/dL — AB (ref 65–99)
GLUCOSE-CAPILLARY: 296 mg/dL — AB (ref 65–99)
GLUCOSE-CAPILLARY: 313 mg/dL — AB (ref 65–99)
Glucose-Capillary: 179 mg/dL — ABNORMAL HIGH (ref 65–99)
Glucose-Capillary: 196 mg/dL — ABNORMAL HIGH (ref 65–99)

## 2016-11-03 LAB — CBC
HCT: 37.7 % (ref 36.0–46.0)
Hemoglobin: 11.6 g/dL — ABNORMAL LOW (ref 12.0–15.0)
MCH: 27.4 pg (ref 26.0–34.0)
MCHC: 30.8 g/dL (ref 30.0–36.0)
MCV: 89.1 fL (ref 78.0–100.0)
PLATELETS: 491 10*3/uL — AB (ref 150–400)
RBC: 4.23 MIL/uL (ref 3.87–5.11)
RDW: 17.3 % — AB (ref 11.5–15.5)
WBC: 14.4 10*3/uL — AB (ref 4.0–10.5)

## 2016-11-03 LAB — MRSA PCR SCREENING: MRSA BY PCR: POSITIVE — AB

## 2016-11-03 LAB — PROTIME-INR
INR: 1.89
PROTHROMBIN TIME: 22 s — AB (ref 11.4–15.2)

## 2016-11-03 MED ORDER — LEVALBUTEROL HCL 0.63 MG/3ML IN NEBU
0.6300 mg | INHALATION_SOLUTION | Freq: Three times a day (TID) | RESPIRATORY_TRACT | Status: DC
  Administered 2016-11-03 (×3): 0.63 mg via RESPIRATORY_TRACT
  Filled 2016-11-03 (×4): qty 3

## 2016-11-03 MED ORDER — CHLORHEXIDINE GLUCONATE CLOTH 2 % EX PADS
6.0000 | MEDICATED_PAD | Freq: Every day | CUTANEOUS | Status: AC
  Administered 2016-11-03 – 2016-11-07 (×5): 6 via TOPICAL

## 2016-11-03 MED ORDER — LANTHANUM CARBONATE 500 MG PO CHEW
1000.0000 mg | CHEWABLE_TABLET | Freq: Three times a day (TID) | ORAL | Status: DC
  Administered 2016-11-03 – 2016-11-10 (×16): 1000 mg via ORAL
  Filled 2016-11-03 (×19): qty 2
  Filled 2016-11-03: qty 1
  Filled 2016-11-03: qty 2

## 2016-11-03 MED ORDER — WARFARIN SODIUM 4 MG PO TABS
4.0000 mg | ORAL_TABLET | Freq: Once | ORAL | Status: AC
  Administered 2016-11-03: 4 mg via ORAL
  Filled 2016-11-03: qty 1

## 2016-11-03 MED ORDER — MUPIROCIN 2 % EX OINT
1.0000 "application " | TOPICAL_OINTMENT | Freq: Two times a day (BID) | CUTANEOUS | Status: AC
  Administered 2016-11-03 – 2016-11-07 (×10): 1 via NASAL
  Filled 2016-11-03 (×2): qty 22

## 2016-11-03 MED ORDER — INSULIN ASPART 100 UNIT/ML ~~LOC~~ SOLN
2.0000 [IU] | SUBCUTANEOUS | Status: DC
  Administered 2016-11-03 (×2): 6 [IU] via SUBCUTANEOUS
  Administered 2016-11-03 – 2016-11-04 (×2): 4 [IU] via SUBCUTANEOUS
  Administered 2016-11-04: 6 [IU] via SUBCUTANEOUS
  Administered 2016-11-04: 2 [IU] via SUBCUTANEOUS
  Administered 2016-11-04: 4 [IU] via SUBCUTANEOUS
  Administered 2016-11-04: 6 [IU] via SUBCUTANEOUS

## 2016-11-03 MED ORDER — RACEPINEPHRINE HCL 2.25 % IN NEBU: 0.5000 mL | INHALATION_SOLUTION | RESPIRATORY_TRACT | Status: AC | PRN

## 2016-11-03 NOTE — Progress Notes (Signed)
Name: Deborah [name] MRN: [id number] DOB: [birth year]    ADMISSION DATE:  11/02/2016 CONSULTATION DATE:  11/02/2016  REFERRING MD :  Dr. Regenia Skeeter EDP  CHIEF COMPLAINT:  SOB  BRIEF PATIENT DESCRIPTION: 60 year old female with recent prolonged hospitalization secondary to papillary muscle rupture and subsequent HCAP. She was intubated requiring trach. Decannulated in the end of October. She then went to [facility] and was discharged 11/29. No presenting to [facility] ED 11/30 with compalints of SOB x1-2 weeks. Worse last 24 hours.   SIGNIFICANT EVENTS  09/12 Admit to [facility] 09/15 To [facility] 09/20 MVR with bioprosthetic valve 09/28 Permcath for HD 09/29 Extubated 09/30 Reintubated 10/05 Trach by [facility] Bible 10/09 Off vent 10/25 decannulated 11/8 to CIR 11/29 DC CIR 11/30 admit for resp distress  STUDIES:    HISTORY OF PRESENT ILLNESS:  60 year old female with past medical history as below, which is significant for diabetes, hypertension, and end-stage renal disease on dialysis TTS. She was recently admitted to [facility] 9/12 for an STEMI. Hospital course thing greatly complicated by acute renal failure requiring CRRT. She then developed neurogenic shock secondary to ruptured papillary muscle and underwent repair 9/21 by Dr. Prescott Gum. She required a prolonged ICU stay with mechanical ventilation and vasoactive infusions. Also complicated by atrial flutter and underwent cardioversion 10/2. Underwent tracheostomy 10/5 and was eventually weaned and decannulated 10/25. She was transferred to [facility] for further rehabilitation where she was slow to progress but it was eventually discharged 11/29. Family states she has been having some breathing difficulty for the last week even while she was in CIR. She has had difficulty taking deep breaths, as it makes her feel so she has to cough. She has also had trouble lying down flat to sleep. This is somewhat worsened over the last day since returning home. She had her first outpatient  dialysis treatment 10/30 and staff there did not like the way that she was breathing. For that reason I sent her to the emergency department for further evaluation. Laboratory and radiographic workup in the emergency department overall is nonspecific. PCCM asked to see.   SUBJECTIVE:  Pt. States she feels SOB at rest , saturations 100% on 2L, in no apparent distress.   VITAL SIGNS: Temp:  [97.6 F (36.4 C)-98.7 F (37.1 C)] 97.6 F (36.4 C) (12/01 0700) Pulse Rate:  [95-109] 101 (12/01 0700) Resp:  [19-38] 32 (12/01 0700) BP: (101-145)/(58-88) 126/88 (12/01 0700) SpO2:  [93 %-100 %] 100 % (12/01 0809) Weight:  [122 lb (55.3 kg)] 122 lb (55.3 kg) (11/30 1803)  PHYSICAL EXAMINATION: General:  Chronically ill appearing female, resting in bed Neuro:  Alert, oriented, non-focal, flat affect. HEENT:  [place]/AT, PERRL, no JVD Cardiovascular:  RRR, no MRG Lungs:  Insp/exp wheeze, also transmitted upper airway rasp, no true stridor.  Abdomen:  No acute deformity or ROM limitation Musculoskeletal:  No acute deformity or ROM limitation Skin:  Grossly intact   Recent Labs Lab 10/31/16 1804 11/02/16 1810 11/03/16 0503  NA 130* 133* 137  K 3.1* 3.7 3.7  CL 93* 95* 95*  CO2 24 20* 17*  BUN 48* 7 13  CREATININE 5.47* 1.99* 3.01*  GLUCOSE 345* 104* 181*    Recent Labs Lab 10/31/16 1803 11/02/16 1810 11/03/16 0503  HGB 9.6* 11.3* 11.6*  HCT 31.1* 36.4 37.7  WBC 10.3 13.9* 14.4*  PLT 463* 513* 491*   Dg Chest Portable 1 View  Result Date: 11/02/2016 CLINICAL DATA:  Shortness of breath and tachypnea EXAM:  PORTABLE CHEST 1 VIEW COMPARISON:  Yesterday FINDINGS: Dialysis catheter on the left with tip at the upper right atrium. Chronic cardiomegaly, status post mitral valve replacement. There is no edema, consolidation, effusion, or pneumothorax. IMPRESSION: No evidence of acute disease. Electronically Signed   By: Monte Fantasia M.D.   On: 11/02/2016 18:29    ASSESSMENT /  PLAN:  Respiratory distress - recent prolonged intubation/tracheostomy now s/p decannulation about one month ago. This was worked up in rehab and CT did not show any tracheal stenosis. She was started on cough meds and GERD therapy maximized without improvement. She is also prescribed nebs that she has not been able to take since returning home, but has never smoked and carries no COPD diagnosis. Potentially could still have some upper airway trauma. Possibly a component of anxiety or allergies as well.  Plan: -Supplemental O2 as needed to keep O2 sats > 92% -Solumedrol 40mg  q 12 hours - NIF/VC q 12 hours - Flonase, Claritin - Scheduled and PRN bronchodilators - Nebulized epi ordered prn stridor with respiratory decompensation. - Incentive spirometry - Mobilize as able - May need ENT to evaluate her airway if not improved - Low dose morphine for WOB (0.5mg  q 6 hours)  Paroxysmal atrial fibrillation Chronic hypotension CAD, MVR -Continue amiodarone, may not be best choice with dyspnea, but no change on CXR so continue for now -Telemetry monitoring -Continue warfarin per home regimen, assess INR/ moniotor for bleedig - Appreciate pharmacy assistance -Continue home plavix, midodrine, statin  DM - CBG monitoring ACHS - Continue home dose insulin with sliding scale (Levemir 8 units daily)  GERD - Continue pantoprazole BID  Leukocytosis - afebrile, no obvious s/s infection - Monitor fever/ WBC - Consider Pan Culture if develops fever  Renal Failure - Continue In Pt HD per renal - Monitor BMET daily -  Diet Heart healthy VTE ppx: on warfarin  Continue SDU status  Will consult ENT to evaluate stridor [facility] ENT consulted.Dr. [name]  Will need Case Management to ensure patient has a working nebulizer machine prior to DC home.Neb machine was not working at [facility] and she was unable to give herself neb treatments.  Magdalen Spatz, AGACNP-BC [place] Pager: [phone]  11/03/2016 8:56 AM

## 2016-11-03 NOTE — Progress Notes (Signed)
Pt still has somewhat stridorous sounds, but doesn't seem to sound as high pitched as previous. Pt does have bilateral wheeze, vitals are WNL. RT attempted NIF/VC with pt, pt unable to follow commands. Pt will open her eyes when her name is called, but does not follow my instructions. RN notified.  RT will continue to monitor.

## 2016-11-03 NOTE — Progress Notes (Deleted)
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 Donzetta Matters and using LIJ perm cath . Admitted 11/30  with SOB / She had  OP HD at [facility] TTS Dr. Juleen China follows/ [facility]) yesterday  For 3.5 hrs with OP RN report of SOB but did not want to come to [facility].   Pre wt 55.8 and post wt 56.0 kg she went home> became progressively more sob. Noted Admit CXR showing Lungs clear and  Admit team  Reviewed CT neck done recently in rehab and did not show tracheostenosis She was decannulated 10/25 .   Objective Vital signs in last 24 hours: Vitals:   11/02/16 2235 11/03/16 0359 11/03/16 0700 11/03/16 0809  BP: (!) 145/78 (!) 141/85 126/88   Pulse: 97 99 (!) 101   Resp: (!) 21 20 (!) 32   Temp: 98.2 F (36.8 C) 98.7 F (37.1 C) 97.6 F (36.4 C)   TempSrc: Oral Oral Oral   SpO2: 97% 99% 100% 100%  Weight:      Height:       Weight change:   Physical Exam: General: alert thin AAF chronically ill  / noted some resp .type Stridor  And not in Distress  Heart: RRR no mur, rub or gallop Lungs: Insp soft wheezes, and coarse resp sounds  Abdomen: bs pos  Soft , NT, ND, Extremities: no pedal edema Dialysis Access:  L IJ Perm cath /pos bruit LUA AVG   Op Dialysis orders=  [place] unit  ( Dr. Juleen China follows/ [facility] Nephrology )4hrs, 2k, 2.5 ca bath  LIJ perm cath, LUA AVF (11/02/16 ) no treatment meds listed .  Background: 60 y/o AAF with DM2, HTN, HLD presented to [facility] ER with infposterior STEMI on 9/12->cath->. PCS with stent x 2 by Dr. Clayborn Bigness. EF 55-60% with severe MR. Post-cath respiratory failure ->intubation. Shock and renal failure (creatinine 1.6 on adm). Trialysis catheter placed 9/14 and pt had CRRT at [facility] for 48 hours prior to  transfer here on 08/18/16->back to cath lab ->ruptured posterior papillary muscle with severe MR->IABP/[place]. We were asked to see d/t progressive rise in creatinine/volume overload/anuric AKI . CRRT re-initiated 08/21/16-9/20, then 9/21 post MVR. Transitioned to IHD.  Dialysis dependent since 08/17/16. No recovery. New ESRD.  Problem/Plan: 1.  SOB/ Resp  Distress  -WU per admit  , noted ENT consulted  to evaluate and inspect for tracheal stenosis/ with ho prolonged Intubation/ tracheostomy s/p decannulation about one month ago.  - no need for HD today. 2. ESRD - HD  TTS schedule ( as op Dr. Juleen China follows/ [facility] Nephrology ) 3. Anemia - hgb 11.6 no esa need, fu hgb trend  4. Secondary hyperparathyroidism -  Fosrenol binder/ no op vit d   5. HTN/volume - bp stable  uf tomorrow  -  2 l-2.5l , needs EDW established (was listed  80 kg AS OP, in error I believe)  6. Paroxysmal atrial fibrillation  -per admit amiodarone,warfarin 7. HO recent NSTEMI with shock 8. 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 HD tomorrow. No evidence pulm edema / vol overload at this time. CXR clear.  Plan lower dry wt  if possible w HD tomorrow.  Kelly Splinter MD [facility] pager [phone]   11/03/2016, 3:57 PM    Labs: Basic Metabolic Panel:  Recent Labs Lab 10/31/16 1804 11/02/16 1810 11/03/16 0503  NA 130* 133* 137  K 3.1* 3.7 3.7  CL 93* 95* 95*  CO2 24 20* 17*  GLUCOSE 345* 104* 181*  BUN 48* 7 13  CREATININE 5.47* 1.99* 3.01*  CALCIUM 8.8* 8.9 9.3  PHOS 2.5  --  4.6   Liver Function Tests:  Recent Labs Lab 10/31/16 1804 11/02/16 1810 11/03/16 0503  AST  --  28  --   ALT  --  19  --   ALKPHOS  --  68  --   BILITOT  --  1.0  --   PROT  --  7.0  --   ALBUMIN 2.5* 2.8* 3.1*   No results for input(s): LIPASE, AMYLASE in the last 168 hours. No  results for input(s): AMMONIA in the last 168 hours. CBC:  Recent Labs Lab 10/30/16 0606 10/31/16 1803 11/02/16 1810 11/03/16 0503  WBC 13.3* 10.3 13.9* 14.4*  NEUTROABS  --   --  10.5*  --   HGB 10.6* 9.6* 11.3* 11.6*  HCT 33.6* 31.1* 36.4 37.7  MCV 87.3 86.4 87.7 89.1  PLT 365 463* 513* 491*   Cardiac Enzymes: No results for input(s): CKTOTAL, CKMB, CKMBINDEX, TROPONINI in the last 168 hours. CBG:  Recent Labs Lab 11/01/16 0629 11/01/16 1134 11/01/16 1638 11/02/16 2243 11/03/16 0820  GLUCAP 250* 252* 131* 109* 179*    Studies/Results: Dg Chest Portable 1 View  Result Date: 11/02/2016 CLINICAL DATA:  Shortness of breath and tachypnea EXAM: PORTABLE CHEST 1 VIEW COMPARISON:  Yesterday FINDINGS: Dialysis catheter on the left with tip at the upper right atrium. Chronic cardiomegaly, status post mitral valve replacement. There is no edema, consolidation, effusion, or pneumothorax. IMPRESSION: No evidence of acute disease. Electronically Signed   By: Monte Fantasia M.D.   On: 11/02/2016 18:29   Medications:  . amiodarone  200 mg Oral Daily  . atorvastatin  20 mg Oral q1800  . Chlorhexidine Gluconate Cloth  6 each Topical Q0600  . clopidogrel  75 mg Oral Daily  . fluticasone  2 spray Each Nare Daily  . insulin detemir  8 Units Subcutaneous Q2200  . levalbuterol  0.63 mg Nebulization TID  . loratadine  10 mg Oral Daily  . methylPREDNISolone (SOLU-MEDROL) injection  40 mg Intravenous Q12H  . metoCLOPramide  5 mg Oral BID WC  . midodrine  10 mg Oral q morning - 10a  . multivitamin  1 tablet Oral QHS  . mupirocin ointment  1 application Nasal BID  . pantoprazole  40 mg Oral BID  . sodium chloride flush  3 mL Intravenous Q12H  . Warfarin - Pharmacist Dosing Inpatient   Does not apply [phone]

## 2016-11-03 NOTE — Consult Note (Signed)
Reason for Consult:airway obstruction Referring Physician: CCM  Deborah [name] is an 60 y.o. female.  HPI: Pt with hx of respiratory issues and resulted in mitral valve repair. She was not able to wean ventilator and had tracheotomy. She was decannulated and was now readmitted for respiratory distress. There was CT scan of the chest and neck with no stenosis but a narrowing of a bronchi from vessel. She is had stridor intermittantly today and had it at home also, She is improved on steroids and racemic.   Past Medical History:  Diagnosis Date  . Diabetes mellitus   . Hyperlipidemia   . Hypertension   . MI, old   . Renal disorder    dialysls    Past Surgical History:  Procedure Laterality Date  . AV FISTULA PLACEMENT Left 10/02/2016   Procedure: INSERTION OF ARTERIOVENOUS (AV) GORE-TEX GRAFT ARM;  Surgeon: Waynetta Sandy, MD;  Location: [place];  Service: Vascular;  Laterality: Left;  . CARDIAC CATHETERIZATION N/A 08/15/2016   Procedure: Left Heart Cath and Coronary Angiography;  Surgeon: Yolonda Kida, MD;  Location: [place] CV LAB;  Service: Cardiovascular;  Laterality: N/A;  . CARDIAC CATHETERIZATION N/A 08/15/2016   Procedure: Coronary Stent Intervention;  Surgeon: Yolonda Kida, MD;  Location: [place] CV LAB;  Service: Cardiovascular;  Laterality: N/A;  . CARDIAC CATHETERIZATION N/A 08/18/2016   Procedure: Right Heart Cath;  Surgeon: Jolaine Artist, MD;  Location: [place] CV LAB;  Service: Cardiovascular;  Laterality: N/A;  . CARDIAC CATHETERIZATION N/A 08/18/2016   Procedure: IABP Insertion;  Surgeon: Jolaine Artist, MD;  Location: [place] CV LAB;  Service: Cardiovascular;  Laterality: N/A;  . CARDIAC CATHETERIZATION Right 08/23/2016   Procedure: CENTRAL LINE INSERTION RIGHT SUBCLAVIAN;  Surgeon: Ivin Poot, MD;  Location: [place];  Service: Open Heart Surgery;  Laterality: Right;  . ENDOVEIN HARVEST OF GREATER SAPHENOUS VEIN Left 08/23/2016    Procedure: ENDOVEIN HARVEST OF GREATER SAPHENOUS VEIN;  Surgeon: Ivin Poot, MD;  Location: [place];  Service: Open Heart Surgery;  Laterality: Left;  . INSERTION OF DIALYSIS CATHETER Bilateral 08/31/2016   Procedure: INSERTION OF DIALYSIS CATHETER LEFT INTERNAL JUGULAR VEIN & INSERTION OF TRIPLE LUMEN RIGHT INTERNAL JUGULAR VEIN;  Surgeon: Angelia Mould, MD;  Location: [place];  Service: Vascular;  Laterality: Bilateral;  . INTRAOPERATIVE TRANSESOPHAGEAL ECHOCARDIOGRAM N/A 08/23/2016   Procedure: INTRAOPERATIVE TRANSESOPHAGEAL ECHOCARDIOGRAM;  Surgeon: Ivin Poot, MD;  Location: [place];  Service: Open Heart Surgery;  Laterality: N/A;  . MITRAL VALVE REPAIR N/A 08/23/2016   Procedure: MITRAL VALVE REPAIR (MVR) USING 25MM EDWARDS MAGNA EASE BIOPROSTHESIS MITRAL  VALVE;  Surgeon: Ivin Poot, MD;  Location: [place];  Service: Open Heart Surgery;  Laterality: N/A;  . tracheostomy reversal    . VAGINAL DELIVERY     x 6    Family History  Problem Relation Age of Onset  . Hypertension Mother   . Diabetes Mother   . Breast cancer Sister     Social History:  reports that she has never smoked. She has never used smokeless tobacco. She reports that she does not drink alcohol or use drugs.  Allergies: No Known Allergies  Medications: I have reviewed the patient's current medications.  Results for orders placed or performed during the hospital encounter of 11/02/16 (from the past 48 hour(s))  CBC with Differential     Status: Abnormal   Collection Time: 11/02/16  6:10 PM  Result Value Ref  Range   WBC 13.9 (H) 4.0 - 10.5 K/uL   RBC 4.15 3.87 - 5.11 MIL/uL   Hemoglobin 11.3 (L) 12.0 - 15.0 g/dL   HCT 36.4 36.0 - 46.0 %   MCV 87.7 78.0 - 100.0 fL   MCH 27.2 26.0 - 34.0 pg   MCHC 31.0 30.0 - 36.0 g/dL   RDW 16.9 (H) 11.5 - 15.5 %   Platelets 513 (H) 150 - 400 K/uL   Neutrophils Relative % 74 %   Neutro Abs 10.5 (H) 1.7 - 7.7 K/uL   Lymphocytes Relative 14 %   Lymphs Abs 1.9  0.7 - 4.0 K/uL   Monocytes Relative 10 %   Monocytes Absolute 1.4 (H) 0.1 - 1.0 K/uL   Eosinophils Relative 1 %   Eosinophils Absolute 0.1 0.0 - 0.7 K/uL   Basophils Relative 1 %   Basophils Absolute 0.1 0.0 - 0.1 K/uL  Comprehensive metabolic panel     Status: Abnormal   Collection Time: 11/02/16  6:10 PM  Result Value Ref Range   Sodium 133 (L) 135 - 145 mmol/L   Potassium 3.7 3.5 - 5.1 mmol/L   Chloride 95 (L) 101 - 111 mmol/L   CO2 20 (L) 22 - 32 mmol/L   Glucose, Bld 104 (H) 65 - 99 mg/dL   BUN 7 6 - 20 mg/dL   Creatinine, Ser 1.99 (H) 0.44 - 1.00 mg/dL   Calcium 8.9 8.9 - 10.3 mg/dL   Total Protein 7.0 6.5 - 8.1 g/dL   Albumin 2.8 (L) 3.5 - 5.0 g/dL   AST 28 15 - 41 U/L   ALT 19 14 - 54 U/L   Alkaline Phosphatase 68 38 - 126 U/L   Total Bilirubin 1.0 0.3 - 1.2 mg/dL   GFR calc non Af Amer 26 (L) >60 mL/min   GFR calc Af Amer 30 (L) >60 mL/min    Comment: (NOTE) The eGFR has been calculated using the CKD EPI equation. This calculation has not been validated in all clinical situations. eGFR's persistently <60 mL/min signify possible Chronic Kidney Disease.    Anion gap 18 (H) 5 - 15  Magnesium     Status: None   Collection Time: 11/02/16  6:23 PM  Result Value Ref Range   Magnesium 1.7 1.7 - 2.4 mg/dL  I-Stat Troponin, ED (not at [facility])     Status: None   Collection Time: 11/02/16  7:49 PM  Result Value Ref Range   Troponin i, poc 0.07 0.00 - 0.08 ng/mL   Comment 3            Comment: Due to the release kinetics of cTnI, a negative result within the first hours of the onset of symptoms does not rule out myocardial infarction with certainty. If myocardial infarction is still suspected, repeat the test at appropriate intervals.   Protime-INR     Status: Abnormal   Collection Time: 11/02/16 10:39 PM  Result Value Ref Range   Prothrombin Time 22.5 (H) 11.4 - 15.2 seconds   INR 1.95   Glucose, capillary     Status: Abnormal   Collection Time: 11/02/16 10:43 PM   Result Value Ref Range   Glucose-Capillary 109 (H) 65 - 99 mg/dL  Protime-INR     Status: Abnormal   Collection Time: 11/03/16  5:03 AM  Result Value Ref Range   Prothrombin Time 22.0 (H) 11.4 - 15.2 seconds   INR 1.89   CBC     Status: Abnormal  Collection Time: 11/03/16  5:03 AM  Result Value Ref Range   WBC 14.4 (H) 4.0 - 10.5 K/uL   RBC 4.23 3.87 - 5.11 MIL/uL   Hemoglobin 11.6 (L) 12.0 - 15.0 g/dL   HCT 37.7 36.0 - 46.0 %   MCV 89.1 78.0 - 100.0 fL   MCH 27.4 26.0 - 34.0 pg   MCHC 30.8 30.0 - 36.0 g/dL   RDW 17.3 (H) 11.5 - 15.5 %   Platelets 491 (H) 150 - 400 K/uL  Renal function panel     Status: Abnormal   Collection Time: 11/03/16  5:03 AM  Result Value Ref Range   Sodium 137 135 - 145 mmol/L   Potassium 3.7 3.5 - 5.1 mmol/L   Chloride 95 (L) 101 - 111 mmol/L   CO2 17 (L) 22 - 32 mmol/L   Glucose, Bld 181 (H) 65 - 99 mg/dL   BUN 13 6 - 20 mg/dL   Creatinine, Ser 3.01 (H) 0.44 - 1.00 mg/dL    Comment: DELTA CHECK NOTED   Calcium 9.3 8.9 - 10.3 mg/dL   Phosphorus 4.6 2.5 - 4.6 mg/dL   Albumin 3.1 (L) 3.5 - 5.0 g/dL   GFR calc non Af Amer 16 (L) >60 mL/min   GFR calc Af Amer 18 (L) >60 mL/min    Comment: (NOTE) The eGFR has been calculated using the CKD EPI equation. This calculation has not been validated in all clinical situations. eGFR's persistently <60 mL/min signify possible Chronic Kidney Disease.    Anion gap 25 (H) 5 - 15  MRSA PCR Screening     Status: Abnormal   Collection Time: 11/03/16  5:42 AM  Result Value Ref Range   MRSA by PCR POSITIVE (A) NEGATIVE    Comment:        The GeneXpert MRSA Assay (FDA approved for NASAL specimens only), is one component of a comprehensive MRSA colonization surveillance program. It is not intended to diagnose MRSA infection nor to guide or monitor treatment for MRSA infections. RESULT CALLED TO, READ BACK BY AND VERIFIED WITH: M.SCHMIT RN AT [phone] 11/03/16 BY A.DAVIS   Glucose, capillary     Status:  Abnormal   Collection Time: 11/03/16  8:20 AM  Result Value Ref Range   Glucose-Capillary 179 (H) 65 - 99 mg/dL   Comment 1 Notify RN    Comment 2 Document in Chart   Glucose, capillary     Status: Abnormal   Collection Time: 11/03/16  1:08 PM  Result Value Ref Range   Glucose-Capillary 296 (H) 65 - 99 mg/dL   Comment 1 Notify RN    Comment 2 Document in Chart     Dg Chest Portable 1 View  Result Date: 11/02/2016 CLINICAL DATA:  Shortness of breath and tachypnea EXAM: PORTABLE CHEST 1 VIEW COMPARISON:  Yesterday FINDINGS: Dialysis catheter on the left with tip at the upper right atrium. Chronic cardiomegaly, status post mitral valve replacement. There is no edema, consolidation, effusion, or pneumothorax. IMPRESSION: No evidence of acute disease. Electronically Signed   By: Monte Fantasia M.D.   On: 11/02/2016 18:29    ROS Blood pressure (!) 104/59, pulse 92, temperature 98.7 F (37.1 C), temperature source Oral, resp. rate (!) 24, height _0  (1.575 m), weight 55.3 kg (122 lb), SpO2 100 %. Physical Exam  Constitutional: She appears well-developed and well-nourished.  HENT:  Head: Normocephalic.  Nose: Nose normal.  Mouth/Throat: Oropharynx is clear and moist.  Her voice is harsh and  raspy. Occasionally I hear the stridor but mostly there is no distress or stridor. FOE- NP normal. Palate normal. Larynx has thickened vocal cords bilaterally and they only open in the intermediate position. Both cords adduct bilaterally, This narrowing is limiting to the airflow. It does not look like the arytenoids are not dislocated. There is no vocal cord lesions. Could not see into the subglottis. Larynx without inflammatory appearance.   Eyes: Conjunctivae are normal. Pupils are equal, round, and reactive to light.  Neck: Normal range of motion. Neck supple.  Trach scar    Assessment/Plan: Vocal cord dysfunction/respiratory distress- the cords are not opening from either scarring, peripheral  or central nerve issues, or inflammatory condition. It does not look like there is arytenoid dislocation. I think the patient will need another trach if she continues to struggle. It is not completely clear the larynx is the only issue. Will see how she does over the next few days and the family talks about the options,. Right now the son DOES NOT want another trach. There will be a family conference tomorrow at 63 am.   Melissa Montane 11/03/2016, 4:49 PM

## 2016-11-03 NOTE — Progress Notes (Signed)
ANTICOAGULATION CONSULT NOTE - Initial Consult  Pharmacy Consult for Coumadin Indication: atrial fibrillation  No Known Allergies  Patient Measurements: Height: 5\' 2"  (157.5 cm) Weight: 122 lb (55.3 kg) IBW/kg (Calculated) : 50.1  Vital Signs: Temp: 97.6 F (36.4 C) (12/01 0700) Temp Source: Oral (12/01 0700) BP: 126/88 (12/01 0700) Pulse Rate: 101 (12/01 0700)  Labs:  Recent Labs  10/31/16 1803 10/31/16 1804 11/01/16 0514 11/02/16 1810 11/02/16 2239 11/03/16 0503  HGB 9.6*  --   --  11.3*  --  11.6*  HCT 31.1*  --   --  36.4  --  37.7  PLT 463*  --   --  513*  --  491*  LABPROT  --   --  26.0*  --  22.5* 22.0*  INR  --   --  2.33  --  1.95 1.89  CREATININE  --  5.47*  --  1.99*  --  3.01*    Estimated Creatinine Clearance: 15.9 mL/min (by C-G formula based on SCr of 3.01 mg/dL (H)).   Medical History: Past Medical History:  Diagnosis Date  . Diabetes mellitus   . Hyperlipidemia   . Hypertension   . MI, old   . Renal disorder    dialysls    Medications:  Prescriptions Prior to Admission  Medication Sig Dispense Refill Last Dose  . amiodarone (PACERONE) 200 MG tablet Take 1 tablet (200 mg total) by mouth daily. 30 tablet 0 not started yet  . clopidogrel (PLAVIX) 75 MG tablet Take 1 tablet (75 mg total) by mouth daily. 30 tablet 0 not started yet  . guaiFENesin (ROBITUSSIN) 100 MG/5ML SOLN Take 30 mLs (600 mg total) by mouth every 6 (six) hours. 3000 mL 0 11/01/2016 at Unknown time  . albuterol (PROVENTIL) (2.5 MG/3ML) 0.083% nebulizer solution Take 3 mLs (2.5 mg total) by nebulization 4 (four) times daily - after meals and at bedtime. 150 mL 1 not startec  . ALPRAZolam (XANAX) 0.25 MG tablet Take 1 tablet (0.25 mg total) by mouth 2 (two) times daily as needed for anxiety. 30 tablet 0 not started  . atorvastatin (LIPITOR) 20 MG tablet Take 1 tablet (20 mg total) by mouth daily. 30 tablet 0 not started  . cefdinir (OMNICEF) 125 MG/5ML suspension Take 12 mLs  (300 mg total) by mouth every other day. 48 mL 0 not started  . hydrocortisone (ANUSOL-HC) 2.5 % rectal cream Place rectally 2 (two) times daily as needed for hemorrhoids or itching. 30 g 0 not started  . Insulin Detemir (LEVEMIR) 100 UNIT/ML Pen Inject 8 Units into the skin daily at 10 pm. 15 mL 0 not started  . lanthanum (FOSRENOL) 1000 MG chewable tablet Chew 1 tablet (1,000 mg total) by mouth 3 (three) times daily with meals. 90 tablet 0 not started  . metoCLOPramide (REGLAN) 5 MG tablet Take 1 tablet (5 mg total) by mouth 2 (two) times daily with a meal. 28 tablet 0 not started  . midodrine (PROAMATINE) 10 MG tablet Take 1 tablet (10 mg total) by mouth every morning. 30 tablet 0 not started  . multivitamin (RENA-VIT) TABS tablet Take 1 tablet by mouth at bedtime. 30 tablet 0 not started  . pantoprazole (PROTONIX) 40 MG tablet Take 1 tablet (40 mg total) by mouth 2 (two) times daily. 60 tablet 0 not started  . warfarin (COUMADIN) 2 MG tablet Take 2 mg on Tue, Thu, Sat, Sun 30 tablet 0 not started  . warfarin (COUMADIN) 4 MG tablet Take  4 mg on Mon, Wed, Fri with supper 30 tablet 0 not started    Assessment: 60 y.o. F presents with SOB from HD. On Coumadin for Afib. 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 Recent prolonged hospitalization (d/c 11/29 from rehab). INR on admission INR 1.95 - received 11/30 4mg  dose inpatient at ~0045. INR today remains slightly subtherapeutic at 1.89. Hgb low stable at 11.6, plt 491.   PTA dose (from previous discharge): 4mg  M/W/F and 2mg  T/T/S/S  Goal of Therapy:  INR 2-3 Monitor platelets by anticoagulation protocol: Yes   Plan:  -Coumadin 4mg  x1 tonight -Daily INR -Monitor CBC, s/s bleeding   Stephens November, PharmD Clinical Pharmacist 1:08 PM, 11/03/2016

## 2016-11-03 NOTE — Progress Notes (Signed)
Pt transferred from ED to 3s10 after report.  Pt arrives displaying tachypnea, breath sounds clear, and normal oxygen sat.  Supplemental oxygen and breathing treatment given.  Pt alert and oriented.  Will continue to monitor.

## 2016-11-03 NOTE — Progress Notes (Signed)
Audible stridor heard when entering pt's room. No obvious respiratory distress noted, but pt does say she feels SOB. Humidity added to O2 for comfort. RN notifying MD. RT will continue to monitor.

## 2016-11-03 NOTE — Care Management Note (Signed)
Case Management Note  Patient Details  Name: Deborah [name] MRN: [id number] Date of Birth: March 28, 1956  Subjective/Objective:        presents with recent prolonged hospitalization secondary to papillary muscle rupture and subsequent HCAP. She was intubated requiring trach. Decannulated in the end of October. She then went to [facility] and was discharged 11/29. No presenting to [facility] ED 11/30 with compalints of SOB x1-2 weeks             Action/Plan:   Expected Discharge Date:                  Expected Discharge Plan:  [place]  In-House Referral:  Clinical Social Work  Discharge planning Services  CM Consult  Post Acute Care Choice:    Choice offered to:     DME Arranged:    DME Agency:     HH Arranged:    [place] Agency:     Status of Service:  In process, will continue to follow  If discussed at Long Length of Stay Meetings, dates discussed:    Additional Comments:  Zenon Mayo, RN 11/03/2016, 4:50 PM

## 2016-11-03 NOTE — Telephone Encounter (Signed)
Attempted to call patient, was informed that patient has been readmitted back into hospital

## 2016-11-04 ENCOUNTER — Inpatient Hospital Stay (HOSPITAL_COMMUNITY): Payer: Medicaid Other

## 2016-11-04 DIAGNOSIS — I959 Hypotension, unspecified: Secondary | ICD-10-CM

## 2016-11-04 LAB — GLUCOSE, CAPILLARY
GLUCOSE-CAPILLARY: 153 mg/dL — AB (ref 65–99)
GLUCOSE-CAPILLARY: 269 mg/dL — AB (ref 65–99)
Glucose-Capillary: 144 mg/dL — ABNORMAL HIGH (ref 65–99)
Glucose-Capillary: 197 mg/dL — ABNORMAL HIGH (ref 65–99)
Glucose-Capillary: 347 mg/dL — ABNORMAL HIGH (ref 65–99)

## 2016-11-04 LAB — BASIC METABOLIC PANEL
Anion gap: 13 (ref 5–15)
BUN: 29 mg/dL — ABNORMAL HIGH (ref 6–20)
CHLORIDE: 94 mmol/L — AB (ref 101–111)
CO2: 26 mmol/L (ref 22–32)
CREATININE: 4.7 mg/dL — AB (ref 0.44–1.00)
Calcium: 9.1 mg/dL (ref 8.9–10.3)
GFR calc non Af Amer: 9 mL/min — ABNORMAL LOW (ref >60)
GFR, EST AFRICAN AMERICAN: 11 mL/min — AB (ref >60)
Glucose, Bld: 151 mg/dL — ABNORMAL HIGH (ref 65–99)
Potassium: 3.4 mmol/L — ABNORMAL LOW (ref 3.5–5.1)
Sodium: 133 mmol/L — ABNORMAL LOW (ref 135–145)

## 2016-11-04 LAB — PROTIME-INR
INR: 3.14
Prothrombin Time: 32.9 seconds — ABNORMAL HIGH (ref 11.4–15.2)

## 2016-11-04 LAB — CBC
HEMATOCRIT: 31.5 % — AB (ref 36.0–46.0)
HEMOGLOBIN: 9.9 g/dL — AB (ref 12.0–15.0)
MCH: 27.2 pg (ref 26.0–34.0)
MCHC: 31.4 g/dL (ref 30.0–36.0)
MCV: 86.5 fL (ref 78.0–100.0)
Platelets: 533 10*3/uL — ABNORMAL HIGH (ref 150–400)
RBC: 3.64 MIL/uL — ABNORMAL LOW (ref 3.87–5.11)
RDW: 17.4 % — ABNORMAL HIGH (ref 11.5–15.5)
WBC: 16.7 10*3/uL — ABNORMAL HIGH (ref 4.0–10.5)

## 2016-11-04 MED ORDER — DARBEPOETIN ALFA 60 MCG/0.3ML IJ SOSY
60.0000 ug | PREFILLED_SYRINGE | INTRAMUSCULAR | Status: DC
  Administered 2016-11-04 – 2016-11-11 (×2): 60 ug via INTRAVENOUS
  Filled 2016-11-04 (×2): qty 0.3

## 2016-11-04 MED ORDER — SODIUM CHLORIDE 0.9 % IV SOLN: 100.0000 mL | INTRAVENOUS | Status: DC | PRN

## 2016-11-04 MED ORDER — LEVALBUTEROL HCL 0.63 MG/3ML IN NEBU
0.6300 mg | INHALATION_SOLUTION | Freq: Four times a day (QID) | RESPIRATORY_TRACT | Status: DC | PRN
  Administered 2016-11-04 – 2016-11-06 (×3): 0.63 mg via RESPIRATORY_TRACT
  Filled 2016-11-04 (×2): qty 3

## 2016-11-04 MED ORDER — NEPRO/CARBSTEADY PO LIQD
237.0000 mL | Freq: Two times a day (BID) | ORAL | Status: DC
  Administered 2016-11-04 – 2016-11-07 (×4): 237 mL via ORAL
  Filled 2016-11-04 (×15): qty 237

## 2016-11-04 MED ORDER — DARBEPOETIN ALFA 60 MCG/0.3ML IJ SOSY
PREFILLED_SYRINGE | INTRAMUSCULAR | Status: AC
  Filled 2016-11-04: qty 0.3

## 2016-11-04 MED ORDER — INSULIN ASPART 100 UNIT/ML ~~LOC~~ SOLN
0.0000 [IU] | Freq: Three times a day (TID) | SUBCUTANEOUS | Status: DC
  Administered 2016-11-04: 11 [IU] via SUBCUTANEOUS
  Administered 2016-11-05 (×2): 8 [IU] via SUBCUTANEOUS
  Administered 2016-11-05: 15 [IU] via SUBCUTANEOUS

## 2016-11-04 MED ORDER — ALTEPLASE 2 MG IJ SOLR: 2.0000 mg | Freq: Once | INTRAMUSCULAR | Status: DC | PRN

## 2016-11-04 MED ORDER — HEPARIN SODIUM (PORCINE) 1000 UNIT/ML DIALYSIS
100.0000 [IU]/kg | INTRAMUSCULAR | Status: DC | PRN
  Filled 2016-11-04: qty 6

## 2016-11-04 MED ORDER — PENTAFLUOROPROP-TETRAFLUOROETH EX AERO: 1.0000 "application " | INHALATION_SPRAY | CUTANEOUS | Status: DC | PRN

## 2016-11-04 MED ORDER — LIDOCAINE HCL (PF) 1 % IJ SOLN: 5.0000 mL | INTRAMUSCULAR | Status: DC | PRN

## 2016-11-04 MED ORDER — INSULIN ASPART 100 UNIT/ML ~~LOC~~ SOLN
0.0000 [IU] | Freq: Every day | SUBCUTANEOUS | Status: DC
  Administered 2016-11-05: 5 [IU] via SUBCUTANEOUS

## 2016-11-04 MED ORDER — HEPARIN SODIUM (PORCINE) 1000 UNIT/ML DIALYSIS
1000.0000 [IU] | INTRAMUSCULAR | Status: DC | PRN
  Filled 2016-11-04: qty 1

## 2016-11-04 MED ORDER — LIDOCAINE-PRILOCAINE 2.5-2.5 % EX CREA
1.0000 "application " | TOPICAL_CREAM | CUTANEOUS | Status: DC | PRN
  Filled 2016-11-04: qty 5

## 2016-11-04 NOTE — Progress Notes (Signed)
PROGRESS NOTE        PATIENT DETAILS Name: Deborah [name] Age: 60 y.o. Sex: female Date of Birth: 05-May-1956 Admit Date: 11/02/2016 Admitting Physician Javier Glazier, MD XIP:JASNKN,LZJQBHAL AZBELL, MD  Brief Narrative: Patient is a 60 y.o. female with recent prolonged hospitalization (from 9/15-11/8) for STEMI requiring PCI (at [facility]), complicated by cardiac [facility] shock and acute hypoxemic respiratory failure requiring ventilator support due to papillary muscle rupture causing severe MR. She was transferred to [facility], and underwent mitral valve replacement on 9/20. Postoperatively, she developed septic shock secondary to healthcare associated pneumonia, atrial flutter requiring cardioversion, and unfortunately developed renal failure and is now ESRD and dependent on dialysis. She subsequently required a tracheostomy. She was transferred to  [facility] on 11/8. She was subsequently stabilized and discharged home on 11/29, she is now status post decannulation of her recent tracheostomy. 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 ENT, currently in discussion with patient and family regarding repeat tracheostomy tracheostomy  SIGNIFICANT EVENTS  09/12 Admit to [facility] 09/15 To [facility] 09/20 MVR with bioprosthetic valve 09/28 Permcath for HD 09/29 Extubated 09/30 Reintubated 10/05 Trach by Hyman Bible 10/09 Off vent 10/25 decannulated 11/8 to CIR 11/29 DC CIR 11/30 admit for resp distress  Subjective: Feels better, I do not hear any stridor this morning.  Assessment/Plan: Dyspnea: Do not see any evidence of hypoxia on admission, apparently she had stridor. Recent CT neck on 11/17 did not show any tracheal stenosis, per ENT this is most likely secondary to vocal cord  dysfunction. Her lungs are completely clear on exam, her chest x-ray does not show any pulmonary edema-I doubt that she has any pulmonary edema at this time.ENT currently in discussion with patient and family regarding tracheostomy placement. She currently is stable, I did not hear any stridor on my exam this morning, plans are to continue with steroids, nebs and await further recommendations from ENT. In the new stepdown monitoring.  Paroxysmal atrial fibrillation: Appears to be sinus rhythm, continue amiodarone for rate control, on Coumadin. INR therapeutic, pharmacy managing Coumadin while inpatient.   Recent Inferior posterior STEMI on 9/13 with DES x 2 to OM-1: Continue Plavix and statin-denies chest pain. Shortness of breath likely secondary to vocal cord dysfunction.  Severe ischemic mitral regurgitation due to ruptured posterior papillary muscle in setting of inferoposterior STEMI:s/p MVR 08/23/2016   ESRD: Unfortunately developed renal failure during her most recent long hospitalization-now ESRD on hemodialysis TTS, nephrology following.  Chronic hypotension: On Midodrine  Insulin dependent type 2 diabetes: CBGs stable, continue 8 units of Levemir and SSI. Follow CBGs and adjust accordingly.  Anemia: Probably secondary to chronic disease/ESRD, defer to nephrology  Mild leukocytosis: Probably secondary to steroids, no indication of infection-monitor off antibiotics  DVT Prophylaxis: Full dose anticoagulation with Coumadin  Code Status: Full code   Family Communication: None at bedside  Disposition Plan: Remain inpatient-remain in SDU-suspect will require several more days of hospitalization  Antimicrobial agents: None  Procedures: None  CONSULTS:  pulmonary/intensive care, nephrology and ENT  Time spent: 25 minutes-Greater than 50% of this time was spent in counseling, explanation of diagnosis, planning of further management, and  coordination of  care.  MEDICATIONS: Anti-infectives    None      Scheduled Meds: . amiodarone  200 mg Oral Daily  . atorvastatin  20 mg Oral q1800  . Chlorhexidine Gluconate Cloth  6 each Topical Q0600  . clopidogrel  75 mg Oral Daily  . fluticasone  2 spray Each Nare Daily  . insulin aspart  2-6 Units Subcutaneous Q4H  . insulin detemir  8 Units Subcutaneous Q2200  . lanthanum  1,000 mg Oral TID WC  . levalbuterol  0.63 mg Nebulization TID  . loratadine  10 mg Oral Daily  . methylPREDNISolone (SOLU-MEDROL) injection  40 mg Intravenous Q12H  . metoCLOPramide  5 mg Oral BID WC  . midodrine  10 mg Oral q morning - 10a  . multivitamin  1 tablet Oral QHS  . mupirocin ointment  1 application Nasal BID  . pantoprazole  40 mg Oral BID  . sodium chloride flush  3 mL Intravenous Q12H  . Warfarin - Pharmacist Dosing Inpatient   Does not apply q1800   Continuous Infusions: PRN Meds:.ALPRAZolam, Racepinephrine HCl   PHYSICAL EXAM: Vital signs: Vitals:   11/03/16 2135 11/03/16 2353 11/04/16 0345 11/04/16 0800  BP:  122/65 (!) 99/54 123/70  Pulse:  92 82 81  Resp:  20 (!) 21 14  Temp:  97.4 F (36.3 C) 97.4 F (36.3 C) 97.3 F (36.3 C)  TempSrc:  Axillary Axillary Oral  SpO2: 95% 99% 100% 100%  Weight:      Height:       Filed Weights   11/02/16 1803  Weight: 55.3 kg (122 lb)   Body mass index is 22.31 kg/m.   General appearance :Awake, alert, not in any distress.  Eyes:, pupils equally reactive to light and accomodation,no scleral icterus. HEENT: Atraumatic and Normocephalic Neck: supple, no JVD. No cervical lymphadenopathy.  Resp:Good air entry bilaterally, no added sounds  CVS: S1 S2 regular GI: Bowel sounds present, Non tender and not distended with no gaurding, rigidity or rebound.No organomegaly Extremities: B/L Lower Ext shows no edema, both legs are warm to touch Neurology:  speech clear,Non focal, sensation is grossly intact. Psychiatric: Normal judgment and insight. Alert  and oriented x 3.  Musculoskeletal:No digital cyanosis Skin:No Rash, warm and dry Wounds:N/A  I have personally reviewed following labs and imaging studies  LABORATORY DATA: CBC:  Recent Labs Lab 10/30/16 0606 10/31/16 1803 11/02/16 1810 11/03/16 0503 11/04/16 0358  WBC 13.3* 10.3 13.9* 14.4* 16.7*  NEUTROABS  --   --  10.5*  --   --   HGB 10.6* 9.6* 11.3* 11.6* 9.9*  HCT 33.6* 31.1* 36.4 37.7 31.5*  MCV 87.3 86.4 87.7 89.1 86.5  PLT 365 463* 513* 491* 533*    Basic Metabolic Panel:  Recent Labs Lab 10/31/16 1804 11/02/16 1810 11/02/16 1823 11/03/16 0503 11/04/16 0358  NA 130* 133*  --  137 133*  K 3.1* 3.7  --  3.7 3.4*  CL 93* 95*  --  95* 94*  CO2 24 20*  --  17* 26  GLUCOSE 345* 104*  --  181* 151*  BUN 48* 7  --  13 29*  CREATININE 5.47* 1.99*  --  3.01* 4.70*  CALCIUM 8.8* 8.9  --  9.3 9.1  MG  --   --  1.7  --   --   PHOS 2.5  --   --  4.6  --     GFR: Estimated Creatinine Clearance: 10.2 mL/min (by C-G  formula based on SCr of 4.7 mg/dL (H)).  Liver Function Tests:  Recent Labs Lab 10/31/16 1804 11/02/16 1810 11/03/16 0503  AST  --  28  --   ALT  --  19  --   ALKPHOS  --  68  --   BILITOT  --  1.0  --   PROT  --  7.0  --   ALBUMIN 2.5* 2.8* 3.1*   No results for input(s): LIPASE, AMYLASE in the last 168 hours. No results for input(s): AMMONIA in the last 168 hours.  Coagulation Profile:  Recent Labs Lab 10/31/16 0647 11/01/16 0514 11/02/16 2239 11/03/16 0503 11/04/16 0358  INR 2.52 2.33 1.95 1.89 3.14    Cardiac Enzymes: No results for input(s): CKTOTAL, CKMB, CKMBINDEX, TROPONINI in the last 168 hours.  BNP (last 3 results) No results for input(s): PROBNP in the last 8760 hours.  HbA1C: No results for input(s): HGBA1C in the last 72 hours.  CBG:  Recent Labs Lab 11/03/16 1600 11/03/16 2006 11/03/16 2353 11/04/16 0345 11/04/16 0801  GLUCAP 196* 313* 251* 153* 144*    Lipid Profile: No results for input(s):  CHOL, HDL, LDLCALC, TRIG, CHOLHDL, LDLDIRECT in the last 72 hours.  Thyroid Function Tests: No results for input(s): TSH, T4TOTAL, FREET4, T3FREE, THYROIDAB in the last 72 hours.  Anemia Panel: No results for input(s): VITAMINB12, FOLATE, FERRITIN, TIBC, IRON, RETICCTPCT in the last 72 hours.  Urine analysis:    Component Value Date/Time   COLORURINE YELLOW 10/30/2016 2120   APPEARANCEUR CLOUDY (A) 10/30/2016 2120   LABSPEC 1.021 10/30/2016 2120   PHURINE 5.0 10/30/2016 2120   GLUCOSEU NEGATIVE 10/30/2016 2120   HGBUR NEGATIVE 10/30/2016 2120   BILIRUBINUR SMALL (A) 10/30/2016 2120   [place] NEGATIVE 10/30/2016 2120   PROTEINUR 30 (A) 10/30/2016 2120   NITRITE NEGATIVE 10/30/2016 2120   LEUKOCYTESUR SMALL (A) 10/30/2016 2120    Sepsis Labs: Lactic Acid, Venous    Component Value Date/Time   LATICACIDVEN 0.89 09/10/2016 0736    MICROBIOLOGY: Recent Results (from the past 240 hour(s))  Urine culture     Status: Abnormal   Collection Time: 10/30/16  9:19 PM  Result Value Ref Range Status   Specimen Description URINE, CLEAN CATCH  Final   Special Requests NONE  Final   Culture MULTIPLE SPECIES PRESENT, SUGGEST RECOLLECTION (A)  Final   Report Status 11/01/2016 FINAL  Final  MRSA PCR Screening     Status: Abnormal   Collection Time: 11/03/16  5:42 AM  Result Value Ref Range Status   MRSA by PCR POSITIVE (A) NEGATIVE Final    Comment:        The GeneXpert MRSA Assay (FDA approved for NASAL specimens only), is one component of a comprehensive MRSA colonization surveillance program. It is not intended to diagnose MRSA infection nor to guide or monitor treatment for MRSA infections. RESULT CALLED TO, READ BACK BY AND VERIFIED WITH: M.SCHMIT RN AT [phone] 11/03/16 BY A.DAVIS     RADIOLOGY STUDIES/RESULTS: Dg Chest 2 View  Result Date: 11/01/2016 CLINICAL DATA:  Wheezing, shortness of breath, cough. EXAM: CHEST  2 VIEW COMPARISON:  Radiographs of October 30, 2016.  FINDINGS: Stable cardiomegaly. Status post cardiac valve repair. Left internal jugular dialysis catheter is unchanged in position. No pneumothorax or pleural effusion is noted. No acute pulmonary disease is noted. Bony thorax is unremarkable. IMPRESSION: No active cardiopulmonary disease. Electronically Signed   By: Marijo Conception, M.D.   On: 11/01/2016 07:25  Dg Chest 2 View  Result Date: 10/30/2016 CLINICAL DATA:  Cough and leukocytosis EXAM: CHEST  2 VIEW COMPARISON:  Chest radiograph October 15, 2016 and chest CT October 21, 2016 FINDINGS: There is persistent consolidation in the medial bases bilaterally, slightly more although left than on the right. No new parenchymal lung opacity evident. Heart is borderline prominent pulmonary vascularity within normal limits. No adenopathy. Port-A-Cath tip is at the cavoatrial junction. No pneumothorax. There is a mitral valve replacement. No bone lesions. IMPRESSION: Bibasilar consolidation. No new opacity. Stable cardiac silhouette. Electronically Signed   By: Lowella Grip III M.D.   On: 10/30/2016 10:46   Dg Chest 2 View  Result Date: 10/15/2016 CLINICAL DATA:  Cough.  Chest pain EXAM: CHEST  2 VIEW COMPARISON:  10/11/2016 FINDINGS: There is a left chest wall dialysis catheter with tips in the cavoatrial junction. Moderate cardiac enlargement. Previous median sternotomy and mitral bowel replacement. No pleural effusion or edema. No airspace opacities. IMPRESSION: 1. No acute cardiopulmonary abnormalities. Electronically Signed   By: Kerby Moors M.D.   On: 10/15/2016 09:13   Dg Chest 2 View  Result Date: 10/11/2016 CLINICAL DATA:  Dyspnea and cough for 1 month. EXAM: CHEST  2 VIEW COMPARISON:  10/08/2016, 08/30/2016 FINDINGS: There is stable cardiomegaly. There is subpleural scarring in the left mid lung and left lung base. There may be a small right lower lobe infiltrate. No pneumothorax, effusion or CHF. Status post median sternotomy and mitral  valvular repair. Dual-lumen left jugular central line catheter extends into the right atrium, unchanged in appearance. IMPRESSION: Stable cardiomegaly. Faint opacities in the right lower lobe medially which may reflect a small pneumonic consolidation. Atelectasis and/or scarring at the left lung base. Electronically Signed   By: Ashley Royalty M.D.   On: 10/11/2016 18:24   Ct Soft Tissue Neck Wo Contrast  Result Date: 10/20/2016 CLINICAL DATA:  None. FINDINGS: Pharynx and larynx: Layering secretions in the hypopharynx extending to the bilateral piriform sinus. Normal larynx. Mild proximal tracheal wall irregularity without stenosis. Salivary glands: Normal. Thyroid: Normal. Lymph nodes: No lymphadenopathy by CT size criteria though sensitivity decreased without contrast. Vascular: Retropharyngeal course of the carotid arteries. Tunneled dialysis catheter via LEFT internal jugular venous approach. Limited intracranial: Nonacute. Mild calcific atherosclerosis of the carotid siphons. Visualized orbits: Normal. Mastoids and visualized paranasal sinuses: Mild bilateral maxillary sinus mucosal thickening without air-fluid levels. Mastoid air cells are well aerated. Skeleton: Broad reversed cervical lordosis with moderate to severe C5-6 and C6-7 degenerative discs. Status post median sternotomy. Tooth 8 and LEFT mandible molar periapical lucency/abscess. Upper chest: Lung apices are clear. Markedly enlarged partially imaged main pulmonary artery versus mediastinal mass. Asymmetrically smaller LEFT mainstem bronchus. Other: Tracheostomy scar. IMPRESSION: Mild proximal tracheal wall irregularity without stenosis. Layering secretions in hypopharynx presents aspiration risk. Asymmetrically smaller LEFT lower lobe mainstem bronchus. Markedly enlarged main pulmonary artery versus mediastinal mass. Recommend CT chest. Electronically Signed   By: Elon Alas M.D.   On: 10/20/2016 18:33   Ct Chest Wo Contrast  Result  Date: 10/21/2016 CLINICAL DATA:  60 year old female with possible mediastinal mass identified on neck CT. EXAM: CT CHEST WITHOUT CONTRAST TECHNIQUE: Multidetector CT imaging of the chest was performed following the standard protocol without IV contrast. COMPARISON:  10/20/2016 neck CT. FINDINGS: Cardiovascular: The possible mediastinal mass identified on neck CT represents a mildly enlarged main pulmonary artery with a diameter of 3.7 cm. Cardiomegaly, mitral valve replacement and left IJ central venous catheter with tip at  the superior cavoatrial junction noted. No pericardial effusion. Mediastinum/Nodes: No mass, enlarged lymph nodes identified. Lungs/Pleura: Moderate bilateral lower lobe atelectasis identified. There is no evidence of pleural effusion, pneumothorax or mass. Upper Abdomen: No acute abnormality. Musculoskeletal: Median sternotomy changes noted. No acute bony abnormalities present. IMPRESSION: Mildly enlarged main pulmonary artery (3.7 cm) accounting for possible mediastinal mass identified on recent neck CT. Moderate bilateral lower lobe atelectasis. Cardiomegaly and mitral valve replacement. Electronically Signed   By: Margarette Canada M.D.   On: 10/21/2016 17:34   Dg Chest Portable 1 View  Result Date: 11/02/2016 CLINICAL DATA:  Shortness of breath and tachypnea EXAM: PORTABLE CHEST 1 VIEW COMPARISON:  Yesterday FINDINGS: Dialysis catheter on the left with tip at the upper right atrium. Chronic cardiomegaly, status post mitral valve replacement. There is no edema, consolidation, effusion, or pneumothorax. IMPRESSION: No evidence of acute disease. Electronically Signed   By: Monte Fantasia M.D.   On: 11/02/2016 18:29   Dg Chest Port 1 View  Result Date: 10/08/2016 CLINICAL DATA:  Cough and dyspnea EXAM: PORTABLE CHEST 1 VIEW COMPARISON:  10/04/2016 FINDINGS: Dual-lumen left jugular central line extends into the right atrium. Prior sternotomy and cardiac valvuloplasty. Minimal curvilinear  scarring or atelectasis in the left base. The lungs are otherwise clear. There is no large effusion. The pulmonary vasculature is normal. Unchanged cardiomegaly. Hilar and mediastinal contours are unremarkable and unchanged. IMPRESSION: Minimal curvilinear scarring or atelectasis in the left base. Unchanged cardiomegaly. Electronically Signed   By: Andreas Newport M.D.   On: 10/08/2016 03:04   Dg Abd Portable 1v  Result Date: 10/07/2016 CLINICAL DATA:  Check feeding catheter placement EXAM: PORTABLE ABDOMEN - 1 VIEW COMPARISON:  None. FINDINGS: Scattered large and small bowel gas is noted. Contrast fecal material is noted throughout the colon. A feeding catheter is noted with the tip in the distal stomach directed towards the pylorus. Postsurgical changes in the chest are noted. IMPRESSION: Feeding catheter in the distal stomach. Electronically Signed   By: Inez Catalina M.D.   On: 10/07/2016 10:53   Dg Swallowing Func-speech Pathology  Result Date: 10/11/2016 Objective Swallowing Evaluation: Type of Study: MBS-Modified Barium Swallow Study Patient Details Name: RICHELL CORKER MRN: [id number] Date of Birth: 1956/09/20 Today's Date: 10/11/2016 Time: SLP Start Time (ACUTE ONLY): 1055-SLP Stop Time (ACUTE ONLY): 1109 SLP Time Calculation (min) (ACUTE ONLY): 14 min Past Medical History: Past Medical History: Diagnosis Date . Diabetes mellitus  . Hyperlipidemia  . Hypertension  Past Surgical History: Past Surgical History: Procedure Laterality Date . AV FISTULA PLACEMENT Left 10/02/2016  Procedure: INSERTION OF ARTERIOVENOUS (AV) GORE-TEX GRAFT ARM;  Surgeon: Waynetta Sandy, MD;  Location: [place];  Service: Vascular;  Laterality: Left; . CARDIAC CATHETERIZATION N/A 08/15/2016  Procedure: Left Heart Cath and Coronary Angiography;  Surgeon: Yolonda Kida, MD;  Location: [place] [facility];  Service: Cardiovascular;  Laterality: N/A; . CARDIAC CATHETERIZATION N/A 08/15/2016  Procedure: Coronary Stent  Intervention;  Surgeon: Yolonda Kida, MD;  Location: [place] [facility];  Service: Cardiovascular;  Laterality: N/A; . CARDIAC CATHETERIZATION N/A 08/18/2016  Procedure: Right Heart Cath;  Surgeon: Jolaine Artist, MD;  Location: [place] [facility];  Service: Cardiovascular;  Laterality: N/A; . CARDIAC CATHETERIZATION N/A 08/18/2016  Procedure: IABP Insertion;  Surgeon: Jolaine Artist, MD;  Location: [place] [facility];  Service: Cardiovascular;  Laterality: N/A; . CARDIAC CATHETERIZATION Right 08/23/2016  Procedure: CENTRAL LINE INSERTION RIGHT SUBCLAVIAN;  Surgeon: Ivin Poot, MD;  Location: [facility];  Service: Open Heart Surgery;  Laterality: Right; . ENDOVEIN HARVEST OF GREATER SAPHENOUS VEIN Left 08/23/2016  Procedure: ENDOVEIN HARVEST OF GREATER SAPHENOUS VEIN;  Surgeon: Ivin Poot, MD;  Location: [place];  Service: Open Heart Surgery;  Laterality: Left; . INSERTION OF DIALYSIS CATHETER Bilateral 08/31/2016  Procedure: INSERTION OF DIALYSIS CATHETER LEFT INTERNAL JUGULAR VEIN & INSERTION OF TRIPLE LUMEN RIGHT INTERNAL JUGULAR VEIN;  Surgeon: Angelia Mould, MD;  Location: [place];  Service: Vascular;  Laterality: Bilateral; . INTRAOPERATIVE TRANSESOPHAGEAL ECHOCARDIOGRAM N/A 08/23/2016  Procedure: INTRAOPERATIVE TRANSESOPHAGEAL ECHOCARDIOGRAM;  Surgeon: Ivin Poot, MD;  Location: [place];  Service: Open Heart Surgery;  Laterality: N/A; . MITRAL VALVE REPAIR N/A 08/23/2016  Procedure: MITRAL VALVE REPAIR (MVR) USING 25MM EDWARDS MAGNA EASE BIOPROSTHESIS MITRAL  VALVE;  Surgeon: Ivin Poot, MD;  Location: [place];  Service: Open Heart Surgery;  Laterality: N/A; . VAGINAL DELIVERY    x 6 HPI: 60 yo female admitted to [facility]/15/16 with STEMI s/p cath to [place]. Developed cardiogenic shock, VDRF (ETT 9/14-9/29), AKI with metabolic acidosis and started on CRRT 08/17/16. Transferred to [facility] to tx shock and evaluated new severe MR from papillary muscle rupture. She also had fever and found to have  Group B Strep in sputum culture.Mitral valve replacement 9/20. Swallow evaluation 9/30 with recs for NPO given poor mentation, severely impaired swallow function s/p prolonged intubation. Reintubated 9/30 secondary to septic shock; trach 10/5; new onset right hemiplegia and right gaze preference.  CT negative for acute changes; dx acute encephalopathy. MBS on 10/23 and Dys 2 with nectar thick liquids using a chin tuck began. 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 She had a baseline, congested cough throughout MBS that was not associated with aspiration. Mild piecemeal swallowing observed with pureed and regular solids. A delay in swallow initation to the valleculae with reduced laryngeal closure seen for all PO intake. Given thin liquid, pt had a single event of silent aspiration, but when utilizing a chin tuck intermittent flash penetration occurred with no penetrate/aspirate residuals. Following multiple thin liquid trials, pt's strong cough resulted in retrograde backflow of boluses from the esophagus to the level of the valleculae. Retrograde backflow intermittently occurred into the pharynx with more solid trials; however, her airway remained protected throughout these events. Esophageal sweep showed mild build up of consistencies (MD not present to confirm). Recommend advancement to Dys 3 and thin liquids utilizing a chin tuck for every sip. MD may wish to consider further esophageal assessment given findings on most recent two MBS, which impacts pt's risk for post-prandial aspiration and may be influencing some of her coughing. Will continue to follow acutely for diet tolerance.  Impact on safety and function Mild aspiration risk   CHL IP TREATMENT RECOMMENDATION 10/11/2016 Treatment Recommendations Therapy as outlined in treatment plan below   Prognosis 10/11/2016 Prognosis for Safe Diet Advancement Good Barriers to Reach Goals -- Barriers/Prognosis Comment -- CHL IP DIET RECOMMENDATION 10/11/2016 SLP Diet Recommendations Dysphagia 3 (Mech soft) solids;Thin liquid Liquid Administration via Cup;No straw Medication Administration Crushed with puree Compensations Minimize environmental distractions;Slow rate;Small sips/bites;Chin tuck;Follow solids with liquid Postural Changes Remain semi-upright after after feeds/meals (Comment);Seated upright at 90 degrees   CHL IP OTHER RECOMMENDATIONS 10/11/2016 Recommended Consults -- Oral Care  Recommendations Oral care BID Other Recommendations --   CHL IP FOLLOW UP RECOMMENDATIONS 10/11/2016 Follow up Recommendations Inpatient Rehab   CHL IP FREQUENCY AND DURATION 10/11/2016 Speech Therapy Frequency (ACUTE ONLY) min 2x/week Treatment Duration 2 weeks      CHL IP ORAL PHASE 10/11/2016 Oral Phase Impaired Oral - Pudding Teaspoon -- Oral - Pudding Cup -- Oral - Honey Teaspoon -- Oral - Honey Cup -- Oral - Nectar Teaspoon -- Oral - Nectar Cup NT Oral - Nectar Straw NT Oral - Thin Teaspoon NT Oral - Thin Cup WFL Oral - Thin Straw -- Oral - Puree Piecemeal swallowing Oral - Mech Soft NT Oral - Regular Piecemeal swallowing Oral - Multi-Consistency NT Oral - Pill NT Oral Phase - Comment --  CHL IP PHARYNGEAL PHASE 10/11/2016 Pharyngeal Phase Impaired Pharyngeal- Pudding Teaspoon -- Pharyngeal -- Pharyngeal- Pudding Cup -- Pharyngeal -- Pharyngeal- Honey Teaspoon -- Pharyngeal -- Pharyngeal- Honey Cup -- Pharyngeal -- Pharyngeal- Nectar Teaspoon -- Pharyngeal -- Pharyngeal- Nectar Cup NT Pharyngeal -- Pharyngeal- Nectar Straw NT Pharyngeal -- Pharyngeal- Thin Teaspoon NT Pharyngeal -- Pharyngeal- Thin Cup Penetration/Aspiration before swallow;Reduced airway/laryngeal  closure;Delayed swallow initiation-vallecula;Compensatory strategies attempted (with notebox) Pharyngeal Material enters airway, passes BELOW cords without attempt by patient to eject out (silent aspiration);Material enters airway, remains ABOVE vocal cords then ejected out Pharyngeal- Thin Straw -- Pharyngeal -- Pharyngeal- Puree Delayed swallow initiation-vallecula;Reduced airway/laryngeal closure Pharyngeal -- Pharyngeal- Mechanical Soft -- Pharyngeal -- Pharyngeal- Regular Delayed swallow initiation-vallecula;Reduced airway/laryngeal closure Pharyngeal -- Pharyngeal- Multi-consistency -- Pharyngeal -- Pharyngeal- Pill NT Pharyngeal -- Pharyngeal Comment --  CHL IP CERVICAL ESOPHAGEAL PHASE 10/11/2016 Cervical Esophageal Phase Impaired Pudding Teaspoon -- Pudding Cup -- Honey Teaspoon -- Honey Cup -- Nectar Teaspoon -- Nectar Cup -- Nectar Straw -- Thin Teaspoon -- Thin Cup Esophageal backflow into the pharynx Thin Straw -- Puree Esophageal backflow into the pharynx Mechanical Soft -- Regular Esophageal backflow into the pharynx Multi-consistency -- Pill -- Cervical Esophageal Comment -- No flowsheet data found. Germain Osgood 10/11/2016, 1:23 PM  Note populated for Jiles Prows, student SLP Germain Osgood, M.A. CCC-SLP [phone]               LOS: 2 days   Oren Binet, MD  [facility] Pager:[phone]  If 7PM-7AM, please contact night-coverage [email or web address] Password [email or web address] 11/04/2016, 10:33 AM

## 2016-11-04 NOTE — Progress Notes (Signed)
ANTICOAGULATION CONSULT NOTE - Initial Consult  Pharmacy Consult for Coumadin Indication: atrial fibrillation  No Known Allergies  Patient Measurements: Height: 5\' 2"  (157.5 cm) Weight: 122 lb (55.3 kg) IBW/kg (Calculated) : 50.1  Vital Signs: Temp: 97.3 F (36.3 C) (12/02 0800) Temp Source: Oral (12/02 0800) BP: 123/70 (12/02 0800) Pulse Rate: 81 (12/02 0800)  Labs:  Recent Labs  11/02/16 1810 11/02/16 2239 11/03/16 0503 11/04/16 0358  HGB 11.3*  --  11.6* 9.9*  HCT 36.4  --  37.7 31.5*  PLT 513*  --  491* 533*  LABPROT  --  22.5* 22.0* 32.9*  INR  --  1.95 1.89 3.14  CREATININE 1.99*  --  3.01* 4.70*    Estimated Creatinine Clearance: 10.2 mL/min (by C-G formula based on SCr of 4.7 mg/dL (H)).   Medical History: Past Medical History:  Diagnosis Date  . Diabetes mellitus   . Hyperlipidemia   . Hypertension   . MI, old   . Renal disorder    dialysls    Medications:  Prescriptions Prior to Admission  Medication Sig Dispense Refill Last Dose  . amiodarone (PACERONE) 200 MG tablet Take 1 tablet (200 mg total) by mouth daily. 30 tablet 0 not started yet  . clopidogrel (PLAVIX) 75 MG tablet Take 1 tablet (75 mg total) by mouth daily. 30 tablet 0 not started yet  . guaiFENesin (ROBITUSSIN) 100 MG/5ML SOLN Take 30 mLs (600 mg total) by mouth every 6 (six) hours. 3000 mL 0 11/01/2016 at Unknown time  . albuterol (PROVENTIL) (2.5 MG/3ML) 0.083% nebulizer solution Take 3 mLs (2.5 mg total) by nebulization 4 (four) times daily - after meals and at bedtime. 150 mL 1 not startec  . ALPRAZolam (XANAX) 0.25 MG tablet Take 1 tablet (0.25 mg total) by mouth 2 (two) times daily as needed for anxiety. 30 tablet 0 not started  . atorvastatin (LIPITOR) 20 MG tablet Take 1 tablet (20 mg total) by mouth daily. 30 tablet 0 not started  . cefdinir (OMNICEF) 125 MG/5ML suspension Take 12 mLs (300 mg total) by mouth every other day. 48 mL 0 not started  . hydrocortisone (ANUSOL-HC)  2.5 % rectal cream Place rectally 2 (two) times daily as needed for hemorrhoids or itching. 30 g 0 not started  . Insulin Detemir (LEVEMIR) 100 UNIT/ML Pen Inject 8 Units into the skin daily at 10 pm. 15 mL 0 not started  . lanthanum (FOSRENOL) 1000 MG chewable tablet Chew 1 tablet (1,000 mg total) by mouth 3 (three) times daily with meals. 90 tablet 0 not started  . metoCLOPramide (REGLAN) 5 MG tablet Take 1 tablet (5 mg total) by mouth 2 (two) times daily with a meal. 28 tablet 0 not started  . midodrine (PROAMATINE) 10 MG tablet Take 1 tablet (10 mg total) by mouth every morning. 30 tablet 0 not started  . multivitamin (RENA-VIT) TABS tablet Take 1 tablet by mouth at bedtime. 30 tablet 0 not started  . pantoprazole (PROTONIX) 40 MG tablet Take 1 tablet (40 mg total) by mouth 2 (two) times daily. 60 tablet 0 not started  . warfarin (COUMADIN) 2 MG tablet Take 2 mg on Tue, Thu, Sat, Sun 30 tablet 0 not started  . warfarin (COUMADIN) 4 MG tablet Take 4 mg on Mon, Wed, Fri with supper 30 tablet 0 not started    Assessment: 60 y.o. F presents with SOB from HD. On Coumadin PTA for Afib. 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 Recent prolonged hospitalization (d/c 11/29  from rehab). INR on admission 1.95 >> up to 3.14 after 2 x 4mg  doses.  Hgb 11.6 > 9.9.  No bleeding noted.  PTA dose (from previous discharge): 4mg  M/W/F and 2mg  T/T/S/S  Goal of Therapy:  INR 2-3 Monitor platelets by anticoagulation protocol: Yes   Plan:  -No Coumadin tonight -Daily INR -Monitor CBC, s/s bleeding -Follow up plans/need for trach - may need to hold Coumadin  [employer], Pharm.D., BCPS Clinical Pharmacist 11/04/2016 9:38 AM

## 2016-11-04 NOTE — Progress Notes (Signed)
Subjective:  Breathing better no sob/   For HD today in Room / Noted Dr. Vassie Loll  (ENT) dw pt and family another tracheotomy.  Objective Vital signs in last 24 hours: Vitals:   11/04/16 0345 11/04/16 0800 11/04/16 1130 11/04/16 1134  BP: (!) 99/54 123/70  114/62  Pulse: 82 81  80  Resp: (!) 21 14  15   Temp: 97.4 F (36.3 C) 97.3 F (36.3 C) 97.7 F (36.5 C)   TempSrc: Axillary Oral Oral   SpO2: 100% 100%  100%  Weight:      Height:       Weight change:  Physical Exam: General: alert thin AAF chronically ill  , NAD  OX3   Heart: RRR no mur, rub or gallop Lungs: slightly decr. bs / faint inspir wheeze  Abdomen: bs pos  Soft , NT, ND, Extremities: no pedal edema Dialysis Access:  L IJ Perm cath /pos bruit LUA AVG   Op Dialysis orders=  [place] unit  ( Dr. Juleen China follows/ [facility] )4hrs, 2k, 2.5 ca bath  [facility]J perm cath, LUA AVF (11/02/16 ) no treatment meds listed    Problem/Plan: 1.  SOB/ Resp  Distress  On admit now resolved after steroids/ nebs/ Etiology thought sec Vocal cord dysfunction-ENT consulted and dw family RX plans  2. ESRD - HD  TTS schedule ( as op Dr. Juleen China follows/ [facility] ) hd today  k 3.4  Use 4.0 k bath / will use avgg starting next week tue. 3. Anemia - hgb 11.6>9.6  This am/ reck  On hd /  no esa need, if still <11 start Aranesp 45mcg weekly hd  Give today / also needs weekly Venofer with TFS last admit <10% 4. Secondary hyperparathyroidism -  Fosrenol binder/ no op vit d  Needed with PTH 199  5. HTN/volume - bp stable  uf tomorrow  -  2 l-2.5l , needs EDW established (was listed  80 kg AS OP, in error I believe)  Wt 55.3 on admit  No other  needs daily wts 6. Paroxysmal atrial fibrillation  -per admit amiodarone,warfarin/ NS  On exam /tele. 7. HO recent NSTEMI with shock 8. 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 DM type 2 - per admit  Ernest Haber, PA-C [place] [phone] 11/04/2016,2:22 PM  LOS: 2 days    Pt seen, examined and agree w A/P as above.  Kelly Splinter MD [facility] pager [phone]   11/04/2016, 2:56 PM    Labs: Basic Metabolic Panel:  Recent Labs Lab 10/31/16 1804 11/02/16 1810 11/03/16 0503 11/04/16 0358  NA 130* 133* 137 133*  K 3.1* 3.7 3.7 3.4*  CL 93* 95* 95* 94*  CO2 24 20* 17* 26  GLUCOSE 345* 104* 181* 151*  BUN 48* 7 13 29*  CREATININE 5.47* 1.99* 3.01* 4.70*  CALCIUM 8.8* 8.9 9.3 9.1  PHOS 2.5  --  4.6  --    Liver Function Tests:  Recent Labs Lab 10/31/16 1804 11/02/16 1810 11/03/16 0503  AST  --  28  --   ALT  --  19  --   ALKPHOS  --  68  --   BILITOT  --  1.0  --   PROT  --  7.0  --   ALBUMIN 2.5* 2.8* 3.1*   No results for input(s): LIPASE, AMYLASE in the last 168 hours. No results for input(s): AMMONIA in the last 168 hours. CBC:  Recent Labs Lab 10/30/16 0606  10/31/16 1803 11/02/16 1810 11/03/16 0503 11/04/16 0358  WBC 13.3* 10.3 13.9* 14.4* 16.7*  NEUTROABS  --   --  10.5*  --   --   HGB 10.6* 9.6* 11.3* 11.6* 9.9*  HCT 33.6* 31.1* 36.4 37.7 31.5*  MCV 87.3 86.4 87.7 89.1 86.5  PLT 365 463* 513* 491* 533*   Cardiac Enzymes: No results for input(s): CKTOTAL, CKMB, CKMBINDEX, TROPONINI in the last 168 hours. CBG:  Recent Labs Lab 11/03/16 2006 11/03/16 2353 11/04/16 0345 11/04/16 0801 11/04/16 1134  GLUCAP 313* 251* 153* 144* 197*    Studies/Results: Dg Chest Port 1 View  Result Date: 11/04/2016 CLINICAL DATA:  Acute respiratory failure with hypoxia. EXAM: PORTABLE CHEST 1 VIEW COMPARISON:  11/02/2016. FINDINGS: Cardiomegaly. Prior median sternotomy for valvular disease. Dual lumen catheter from LEFT IJ approach lies with tips in the proximal RA. No infiltrates or edema. No effusion or pneumothorax. Mild subsegmental atelectasis may be present in the retrocardiac region. IMPRESSION: Cardiomegaly.  Dual lumen catheter in satisfactory position. Slight increase in retrocardiac opacity could  represent subsegmental atelectasis. Electronically Signed   By: Staci Righter M.D.   On: 11/04/2016 11:11   Dg Chest Portable 1 View  Result Date: 11/02/2016 CLINICAL DATA:  Shortness of breath and tachypnea EXAM: PORTABLE CHEST 1 VIEW COMPARISON:  Yesterday FINDINGS: Dialysis catheter on the left with tip at the upper right atrium. Chronic cardiomegaly, status post mitral valve replacement. There is no edema, consolidation, effusion, or pneumothorax. IMPRESSION: No evidence of acute disease. Electronically Signed   By: Monte Fantasia M.D.   On: 11/02/2016 18:29   Medications:  . amiodarone  200 mg Oral Daily  . atorvastatin  20 mg Oral q1800  . Chlorhexidine Gluconate Cloth  6 each Topical Q0600  . clopidogrel  75 mg Oral Daily  . feeding supplement (NEPRO CARB STEADY)  237 mL Oral BID BM  . fluticasone  2 spray Each Nare Daily  . insulin aspart  2-6 Units Subcutaneous Q4H  . insulin detemir  8 Units Subcutaneous Q2200  . lanthanum  1,000 mg Oral TID WC  . loratadine  10 mg Oral Daily  . methylPREDNISolone (SOLU-MEDROL) injection  40 mg Intravenous Q12H  . metoCLOPramide  5 mg Oral BID WC  . midodrine  10 mg Oral q morning - 10a  . multivitamin  1 tablet Oral QHS  . mupirocin ointment  1 application Nasal BID  . pantoprazole  40 mg Oral BID  . sodium chloride flush  3 mL Intravenous Q12H  . Warfarin - Pharmacist Dosing Inpatient   Does not apply [phone]

## 2016-11-04 NOTE — Progress Notes (Signed)
Pt. Performed .7L on the vital capacity and -20 on the NIF.

## 2016-11-04 NOTE — Progress Notes (Signed)
VC and NIF done with decent effort. VC 646mL. Pt was unable to adequately perform NIF. Not sure if pt is not understanding directions, or if she just doesn't have the strength to perform maneuver. Pt has congested, nonproductive cough. Pt states she feels like her breathing has improved a little from yesterday. RT will continue to monitor.

## 2016-11-04 NOTE — Progress Notes (Signed)
Subjective: No worsening symptoms last night. She is still having intermittent stridor. No true respiratory distress. Her saturations are staying in the 90s.  Objective: Vital signs in last 24 hours: Temp:  [97.3 F (36.3 C)-98.7 F (37.1 C)] 97.3 F (36.3 C) (12/02 0800) Pulse Rate:  [81-100] 81 (12/02 0800) Resp:  [14-35] 14 (12/02 0800) BP: (99-128)/(54-79) 123/70 (12/02 0800) SpO2:  [95 %-100 %] 100 % (12/02 0800) Last BM Date: 11/03/16  Intake/Output from previous day: 12/01 0701 - 12/02 0700 In: 60 [P.O.:60] Out: -  Intake/Output this shift: No intake/output data recorded.  Alert and awake, she does have intermittent stridor in her voice and the voice is still harsh but certainly understandable. Face: No evidence of lesions or swelling. Facial nerve is intact. Eyes: Pupils equally round and reactive to light extraocular motor muscles are intact Ears: No evidence of any lesions Nose: No swelling or drainage. Septum seems to be midline. The external nose has no deformity or inflammation. Oral cavity/oropharynx: No evidence of any erythema or lesions. Tongue looks normal. Neck: No swelling, masses, or lesions. Soft and good range of motion Neurologic: No evidence of focal lesions or abnormalities. resp: normal effort Cv: Regular Ext: no tenderness or swelling Psych: no obvious issues   Lab Results:   Recent Labs  11/03/16 0503 11/04/16 0358  WBC 14.4* 16.7*  HGB 11.6* 9.9*  HCT 37.7 31.5*  PLT 491* 533*   BMET  Recent Labs  11/03/16 0503 11/04/16 0358  NA 137 133*  K 3.7 3.4*  CL 95* 94*  CO2 17* 26  GLUCOSE 181* 151*  BUN 13 29*  CREATININE 3.01* 4.70*  CALCIUM 9.3 9.1   PT/INR  Recent Labs  11/03/16 0503 11/04/16 0358  LABPROT 22.0* 32.9*  INR 1.89 3.14   ABG No results for input(s): PHART, HCO3 in the last 72 hours.  Invalid input(s): PCO2, PO2  Studies/Results: Dg Chest Portable 1 View  Result Date: 11/02/2016 CLINICAL DATA:   Shortness of breath and tachypnea EXAM: PORTABLE CHEST 1 VIEW COMPARISON:  Yesterday FINDINGS: Dialysis catheter on the left with tip at the upper right atrium. Chronic cardiomegaly, status post mitral valve replacement. There is no edema, consolidation, effusion, or pneumothorax. IMPRESSION: No evidence of acute disease. Electronically Signed   By: Monte Fantasia M.D.   On: 11/02/2016 18:29    Anti-infectives: Anti-infectives    None      Assessment/Plan: s/p * No surgery found * I had a family conference with the nurse in the room with multiple family members and the patient. I spent 45 minutes with the discussion. We discussed the situation of her airway. We discussed that there still could be some issues with her pulmonary status and some of her shortness of breath may be secondary to residual pulmonary problems but it seems a major portion of this is her vocal cord dysfunction. Because of the above situation and all the medical problems she has its no guarantee that her shortness of breath will be completely resolved even with tracheotomy. We described and talked about the vocal cords not opening. They understand that bypassing this would require either an endotracheal tube or a trach. Right now she is stable but that seems to fluctuate and certainly I will expect that any exertion would increase her stridor. We discussed the tracheotomy including risks, benefits, and options. We discussed how it is handled and cared for after the surgery and going into the future. I cannot tell them if the vocal  cords will begin to function or not but in the future there may be some ability to augment the vocal cords to allow an airway that still provides for voicing. The family asked many questions regarding this whole situation. The patient is still undecided as to exactly what she wants to do. She does want to remain a full code and she understands that  if she gets into trouble she will need to be intubated.  All the patient's questions as well as all the family members questions were answered.  LOS: 2 days    Melissa Montane 11/04/2016

## 2016-11-05 DIAGNOSIS — D631 Anemia in chronic kidney disease: Secondary | ICD-10-CM

## 2016-11-05 LAB — GLUCOSE, CAPILLARY
GLUCOSE-CAPILLARY: 285 mg/dL — AB (ref 65–99)
Glucose-Capillary: 256 mg/dL — ABNORMAL HIGH (ref 65–99)
Glucose-Capillary: 373 mg/dL — ABNORMAL HIGH (ref 65–99)
Glucose-Capillary: 380 mg/dL — ABNORMAL HIGH (ref 65–99)

## 2016-11-05 LAB — CBC
HEMATOCRIT: 33.8 % — AB (ref 36.0–46.0)
Hemoglobin: 10.7 g/dL — ABNORMAL LOW (ref 12.0–15.0)
MCH: 27.6 pg (ref 26.0–34.0)
MCHC: 31.7 g/dL (ref 30.0–36.0)
MCV: 87.1 fL (ref 78.0–100.0)
Platelets: 532 10*3/uL — ABNORMAL HIGH (ref 150–400)
RBC: 3.88 MIL/uL (ref 3.87–5.11)
RDW: 17.9 % — AB (ref 11.5–15.5)
WBC: 18.9 10*3/uL — AB (ref 4.0–10.5)

## 2016-11-05 LAB — PROTIME-INR
INR: 3.69
Prothrombin Time: 37.5 seconds — ABNORMAL HIGH (ref 11.4–15.2)

## 2016-11-05 MED ORDER — INSULIN DETEMIR 100 UNIT/ML ~~LOC~~ SOLN
15.0000 [IU] | Freq: Every day | SUBCUTANEOUS | Status: DC
  Administered 2016-11-05: 15 [IU] via SUBCUTANEOUS
  Filled 2016-11-05 (×2): qty 0.15

## 2016-11-05 NOTE — Progress Notes (Signed)
ANTICOAGULATION CONSULT NOTE - Initial Consult  Pharmacy Consult for Coumadin Indication: atrial fibrillation  No Known Allergies  Patient Measurements: Height: 5\' 2"  (157.5 cm) Weight: 119 lb 0.8 oz (54 kg) IBW/kg (Calculated) : 50.1  Vital Signs: Temp: 97.5 F (36.4 C) (12/03 0802) Temp Source: Oral (12/03 0802) BP: 130/69 (12/03 0802) Pulse Rate: 86 (12/03 0802)  Labs:  Recent Labs  11/02/16 1810  11/03/16 0503 11/04/16 0358 11/05/16 0402  HGB 11.3*  --  11.6* 9.9* 10.7*  HCT 36.4  --  37.7 31.5* 33.8*  PLT 513*  --  491* 533* 532*  LABPROT  --   < > 22.0* 32.9* 37.5*  INR  --   < > 1.89 3.14 3.69  CREATININE 1.99*  --  3.01* 4.70*  --   < > = values in this interval not displayed.  Estimated Creatinine Clearance: 10.2 mL/min (by C-G formula based on SCr of 4.7 mg/dL (H)).   Medical History: Past Medical History:  Diagnosis Date  . Diabetes mellitus   . Hyperlipidemia   . Hypertension   . MI, old   . Renal disorder    dialysls    Medications:  Prescriptions Prior to Admission  Medication Sig Dispense Refill Last Dose  . amiodarone (PACERONE) 200 MG tablet Take 1 tablet (200 mg total) by mouth daily. 30 tablet 0 not started yet  . clopidogrel (PLAVIX) 75 MG tablet Take 1 tablet (75 mg total) by mouth daily. 30 tablet 0 not started yet  . guaiFENesin (ROBITUSSIN) 100 MG/5ML SOLN Take 30 mLs (600 mg total) by mouth every 6 (six) hours. 3000 mL 0 11/01/2016 at Unknown time  . albuterol (PROVENTIL) (2.5 MG/3ML) 0.083% nebulizer solution Take 3 mLs (2.5 mg total) by nebulization 4 (four) times daily - after meals and at bedtime. 150 mL 1 not startec  . ALPRAZolam (XANAX) 0.25 MG tablet Take 1 tablet (0.25 mg total) by mouth 2 (two) times daily as needed for anxiety. 30 tablet 0 not started  . atorvastatin (LIPITOR) 20 MG tablet Take 1 tablet (20 mg total) by mouth daily. 30 tablet 0 not started  . cefdinir (OMNICEF) 125 MG/5ML suspension Take 12 mLs (300 mg  total) by mouth every other day. 48 mL 0 not started  . hydrocortisone (ANUSOL-HC) 2.5 % rectal cream Place rectally 2 (two) times daily as needed for hemorrhoids or itching. 30 g 0 not started  . Insulin Detemir (LEVEMIR) 100 UNIT/ML Pen Inject 8 Units into the skin daily at 10 pm. 15 mL 0 not started  . lanthanum (FOSRENOL) 1000 MG chewable tablet Chew 1 tablet (1,000 mg total) by mouth 3 (three) times daily with meals. 90 tablet 0 not started  . metoCLOPramide (REGLAN) 5 MG tablet Take 1 tablet (5 mg total) by mouth 2 (two) times daily with a meal. 28 tablet 0 not started  . midodrine (PROAMATINE) 10 MG tablet Take 1 tablet (10 mg total) by mouth every morning. 30 tablet 0 not started  . multivitamin (RENA-VIT) TABS tablet Take 1 tablet by mouth at bedtime. 30 tablet 0 not started  . pantoprazole (PROTONIX) 40 MG tablet Take 1 tablet (40 mg total) by mouth 2 (two) times daily. 60 tablet 0 not started  . warfarin (COUMADIN) 2 MG tablet Take 2 mg on Tue, Thu, Sat, Sun 30 tablet 0 not started  . warfarin (COUMADIN) 4 MG tablet Take 4 mg on Mon, Wed, Fri with supper 30 tablet 0 not started    Assessment:  60 y.o. F presents with SOB from outpt HD center. On Coumadin PTA for Afib. 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 Recent prolonged hospitalization (d/c 11/29 from rehab). INR on admission 1.95 >> now up to 3.69 after 2 x 4mg  doses.  Hgb 10.7.  No bleeding noted.  PTA dose (from previous discharge): 4mg  M/W/F and 2mg  T/T/S/S  Goal of Therapy:  INR 2-3 Monitor platelets by anticoagulation protocol: Yes   Plan:  No Coumadin tonight Daily INR Monitor CBC, s/s bleeding Follow up plans/need for trach - may need to hold Coumadin  [employer], Pharm.D., BCPS Clinical Pharmacist 11/05/2016 10:56 AM

## 2016-11-05 NOTE — Progress Notes (Signed)
VC-0.4L NIF- -15cmh2o

## 2016-11-05 NOTE — Progress Notes (Signed)
Subjective: feeling a little better today, cough/ congestion has improved some, no new c/o   Objective Vital signs in last 24 hours: Vitals:   11/04/16 2340 11/05/16 0250 11/05/16 0251 11/05/16 0802  BP: 114/60 117/68 117/68 130/69  Pulse: 83 86 87 86  Resp: 20 16 19 20   Temp: 97.7 F (36.5 C)  98.1 F (36.7 C) 97.5 F (36.4 C)  TempSrc: Oral  Oral Oral  SpO2: 100% 100% 100% 100%  Weight:   54 kg (119 lb 0.8 oz)   Height:   5\' 2"  (1.575 m)    Weight change:  Physical Exam: General: alert thin AAF chronically ill  , NAD  OX3   Heart: RRR no mur, rub or gallop Lungs: clear lungs post to bases bilat; insp wheezing resolved, +coarse rhonchi off and on  Abdomen: bs pos  Soft , NT, ND, Extremities: no pedal edema Dialysis Access:  L IJ Perm cath /pos bruit LUA AVG   Op Dialysis orders=  [place] unit  ( Dr. Juleen China follows/ [facility] )4hrs, 2k, 2.5 ca bath  [facility] perm cath, LUA AVF (11/02/16 ) no treatment meds listed  Don't have a dry wt, she was only at OP unit one day then readmitted   Problem/Plan: 1. Cough/ dyspnea - per ENT upper airway issues/ stridor. See their detailed note.  On steroids, seems better today 2. ESRD - recent start in hosp 3 mos; TTS schedule ( Dr. [facility]), will use avgg starting next week tue. Had only one OP dialysis before being readmitted  3. Volume - don't have a dry wt, euvol on exam at 54kg today 4. Anemia - hgb 11.6>9.6  This am/ reck  On hd /  no esa need, if still <11 start Aranesp 48mcg weekly hd  Give today / also needs weekly Venofer with TFS last admit <10% 5. Secondary hyperparathyroidism -  Fosrenol binder/ no op vit d  Needed with PTH 199  6. HTN/volume - was dc'd home on midodrine 10 qam, not sure she needs this, will dc for now.  7. Paroxysmal atrial fibrillation  -per admit amiodarone,warfarin/ NS  On exam /tele. 8. HO recent NSTEMI with shock 9. 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 DM  type 2 - per admit  Ernest Haber, PA-C [place] [phone] 11/05/2016,12:03 PM  LOS: 3 days   Pt seen, examined and agree w A/P as above.  Kelly Splinter MD [facility] pager [phone]   11/05/2016, 12:03 PM    Labs: Basic Metabolic Panel:  Recent Labs Lab 10/31/16 1804 11/02/16 1810 11/03/16 0503 11/04/16 0358  NA 130* 133* 137 133*  K 3.1* 3.7 3.7 3.4*  CL 93* 95* 95* 94*  CO2 24 20* 17* 26  GLUCOSE 345* 104* 181* 151*  BUN 48* 7 13 29*  CREATININE 5.47* 1.99* 3.01* 4.70*  CALCIUM 8.8* 8.9 9.3 9.1  PHOS 2.5  --  4.6  --    Liver Function Tests:  Recent Labs Lab 10/31/16 1804 11/02/16 1810 11/03/16 0503  AST  --  28  --   ALT  --  19  --   ALKPHOS  --  68  --   BILITOT  --  1.0  --   PROT  --  7.0  --   ALBUMIN 2.5* 2.8* 3.1*   No results for input(s): LIPASE, AMYLASE in the last 168 hours. No results for input(s): AMMONIA in the last 168 hours. CBC:  Recent  Labs Lab 10/31/16 1803 11/02/16 1810 11/03/16 0503 11/04/16 0358 11/05/16 0402  WBC 10.3 13.9* 14.4* 16.7* 18.9*  NEUTROABS  --  10.5*  --   --   --   HGB 9.6* 11.3* 11.6* 9.9* 10.7*  HCT 31.1* 36.4 37.7 31.5* 33.8*  MCV 86.4 87.7 89.1 86.5 87.1  PLT 463* 513* 491* 533* 532*   Cardiac Enzymes: No results for input(s): CKTOTAL, CKMB, CKMBINDEX, TROPONINI in the last 168 hours. CBG:  Recent Labs Lab 11/04/16 0801 11/04/16 1134 11/04/16 1536 11/04/16 2003 11/05/16 0804  GLUCAP 144* 197* 269* 347* 256*    Studies/Results: Dg Chest Port 1 View  Result Date: 11/04/2016 CLINICAL DATA:  Acute respiratory failure with hypoxia. EXAM: PORTABLE CHEST 1 VIEW COMPARISON:  11/02/2016. FINDINGS: Cardiomegaly. Prior median sternotomy for valvular disease. Dual lumen catheter from LEFT IJ approach lies with tips in the proximal RA. No infiltrates or edema. No effusion or pneumothorax. Mild subsegmental atelectasis may be present in the retrocardiac region.  IMPRESSION: Cardiomegaly.  Dual lumen catheter in satisfactory position. Slight increase in retrocardiac opacity could represent subsegmental atelectasis. Electronically Signed   By: Staci Righter M.D.   On: 11/04/2016 11:11   Medications:  . amiodarone  200 mg Oral Daily  . atorvastatin  20 mg Oral q1800  . Chlorhexidine Gluconate Cloth  6 each Topical Q0600  . clopidogrel  75 mg Oral Daily  . darbepoetin (ARANESP) injection - DIALYSIS  60 mcg Intravenous Q Sat-HD  . feeding supplement (NEPRO CARB STEADY)  237 mL Oral BID BM  . fluticasone  2 spray Each Nare Daily  . insulin aspart  0-15 Units Subcutaneous TID WC  . insulin aspart  0-5 Units Subcutaneous QHS  . insulin detemir  15 Units Subcutaneous Q2200  . lanthanum  1,000 mg Oral TID WC  . loratadine  10 mg Oral Daily  . methylPREDNISolone (SOLU-MEDROL) injection  40 mg Intravenous Q12H  . metoCLOPramide  5 mg Oral BID WC  . midodrine  10 mg Oral q morning - 10a  . multivitamin  1 tablet Oral QHS  . mupirocin ointment  1 application Nasal BID  . pantoprazole  40 mg Oral BID  . sodium chloride flush  3 mL Intravenous Q12H  . Warfarin - Pharmacist Dosing Inpatient   Does not apply [phone]

## 2016-11-05 NOTE — Progress Notes (Addendum)
PROGRESS NOTE        PATIENT DETAILS Name: Deborah [name] Age: 60 y.o. Sex: female Date of Birth: 07-21-1956 Admit Date: 11/02/2016 Admitting Physician Javier Glazier, MD BWI:OMBTDH,RCBULAGT AZBELL, MD  Brief Narrative: Patient is a 60 y.o. female with recent prolonged hospitalization (from 9/15-11/8) for STEMI requiring PCI (at [facility]), complicated by cardiac [facility] shock and acute hypoxemic respiratory failure requiring ventilator support due to papillary muscle rupture causing severe MR. She was transferred to [facility], and underwent mitral valve replacement on 9/20. Postoperatively, she developed septic shock secondary to healthcare associated pneumonia, atrial flutter requiring cardioversion, and unfortunately developed renal failure and is now ESRD and dependent on dialysis. She subsequently required a tracheostomy. She was transferred to  [facility] on 11/8. She was subsequently stabilized and discharged home on 11/29, she is now status post decannulation of her recent tracheostomy. 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 ENT, currently in discussion with patient and family regarding repeat tracheostomy tracheostomy  SIGNIFICANT EVENTS  09/12 Admit to [facility] 09/15 To [facility] 09/20 MVR with bioprosthetic valve 09/28 Permcath for HD 09/29 Extubated 09/30 Reintubated 10/05 Trach by Hyman Bible 10/09 Off vent 10/25 decannulated 11/8 to CIR 11/29 DC CIR 11/30 admit for resp distress  Subjective: Continues to cough- no SOB or stridor.   Assessment/Plan: Dyspnea:felt to be secondary to vocal cord dysfunction. Recent CT neck on 11/17 did not show any tracheal stenosi.ENT in discussion with family regarding tracheostomy. She currently is stable, I do not hear any  stridor on my exam this morning, plans are to continue with steroids, nebs and await further recommendations from ENT-furthermore patient/fammily still deciding about trach.Continue stepdown monitoring.  Paroxysmal atrial fibrillation: Appears to be sinus rhythm, continue amiodarone for rate control, on Coumadin. INR therapeutic, pharmacy managing Coumadin while inpatient.   Recent Inferior posterior STEMI on 9/13 with DES x 2 to OM-1: Continue Plavix and statin-denies chest pain. Shortness of breath likely secondary to vocal cord dysfunction.  Severe ischemic mitral regurgitation due to ruptured posterior papillary muscle in setting of inferoposterior STEMI:s/p MVR 08/23/2016   ESRD: Unfortunately developed renal failure during her most recent long hospitalization-now ESRD on hemodialysis TTS, nephrology following.  Chronic hypotension: On Midodrine  Insulin dependent type 2 diabetes: CBGs elevated-on steroids, increase Levemir to 15 units, continue SSI. Follow CBGs and adjust accordingly.  Anemia: Probably secondary to chronic disease/ESRD, defer to nephrology  Mild leukocytosis: Probably secondary to steroids, no indication of infection-monitor off antibiotics  DVT Prophylaxis: Full dose anticoagulation with Coumadin  Code Status: Full code   Family Communication: Daughter at bedside-explained concerns regarding trach placement-have encouraged them to ask questions to ENT  Disposition Plan: Remain inpatient-remain in SDU-suspect will require several more days of hospitalization  Antimicrobial agents: None  Procedures: None  CONSULTS:  pulmonary/intensive care, nephrology and ENT  Time spent: 25 minutes-Greater than 50% of this time was spent in counseling, explanation of diagnosis, planning of further management, and coordination of care.  MEDICATIONS: Anti-infectives    None      Scheduled Meds: . amiodarone  200 mg Oral Daily  . atorvastatin  20 mg Oral q1800  .  Chlorhexidine Gluconate Cloth  6 each Topical Q0600  . clopidogrel  75 mg Oral Daily  . darbepoetin (ARANESP) injection - DIALYSIS  60 mcg Intravenous Q Sat-HD  . feeding supplement (NEPRO CARB STEADY)  237 mL Oral BID BM  . fluticasone  2 spray Each Nare Daily  . insulin aspart  0-15 Units Subcutaneous TID WC  . insulin aspart  0-5 Units Subcutaneous QHS  . insulin detemir  8 Units Subcutaneous Q2200  . lanthanum  1,000 mg Oral TID WC  . loratadine  10 mg Oral Daily  . methylPREDNISolone (SOLU-MEDROL) injection  40 mg Intravenous Q12H  . metoCLOPramide  5 mg Oral BID WC  . midodrine  10 mg Oral q morning - 10a  . multivitamin  1 tablet Oral QHS  . mupirocin ointment  1 application Nasal BID  . pantoprazole  40 mg Oral BID  . sodium chloride flush  3 mL Intravenous Q12H  . Warfarin - Pharmacist Dosing Inpatient   Does not apply q1800   Continuous Infusions: PRN Meds:.sodium chloride, sodium chloride, ALPRAZolam, alteplase, heparin, heparin, levalbuterol, lidocaine (PF), lidocaine-prilocaine, pentafluoroprop-tetrafluoroeth   PHYSICAL EXAM: Vital signs: Vitals:   11/04/16 2340 11/05/16 0250 11/05/16 0251 11/05/16 0802  BP: 114/60 117/68 117/68 130/69  Pulse: 83 86 87 86  Resp: 20 16 19 20   Temp: 97.7 F (36.5 C)  98.1 F (36.7 C) 97.5 F (36.4 C)  TempSrc: Oral  Oral Oral  SpO2: 100% 100% 100% 100%  Weight:   54 kg (119 lb 0.8 oz)   Height:   5\' 2"  (1.575 m)    Filed Weights   11/04/16 1623 11/04/16 2230 11/05/16 0251  Weight: 56.7 kg (125 lb) 54.7 kg (120 lb 9.5 oz) 54 kg (119 lb 0.8 oz)   Body mass index is 21.77 kg/m.   General appearance :Awake, alert, not in any distress.  Eyes:, pupils equally reactive to light and accomodation,no scleral icterus. HEENT: Atraumatic and Normocephalic Neck: supple, no JVD. No cervical lymphadenopathy.  Resp:Good air entry bilaterally, no added sounds  CVS: S1 S2 regular GI: Bowel sounds present, Non tender and not distended with  no gaurding, rigidity or rebound.No organomegaly Extremities: B/L Lower Ext shows no edema, both legs are warm to touch Neurology:  speech clear,Non focal, sensation is grossly intact. Psychiatric: Normal judgment and insight. Alert and oriented x 3.  Musculoskeletal:No digital cyanosis Skin:No Rash, warm and dry Wounds:N/A  I have personally reviewed following labs and imaging studies  LABORATORY DATA: CBC:  Recent Labs Lab 10/31/16 1803 11/02/16 1810 11/03/16 0503 11/04/16 0358 11/05/16 0402  WBC 10.3 13.9* 14.4* 16.7* 18.9*  NEUTROABS  --  10.5*  --   --   --   HGB 9.6* 11.3* 11.6* 9.9* 10.7*  HCT 31.1* 36.4 37.7 31.5* 33.8*  MCV 86.4 87.7 89.1 86.5 87.1  PLT 463* 513* 491* 533* 532*    Basic Metabolic Panel:  Recent Labs Lab 10/31/16 1804 11/02/16 1810 11/02/16 1823 11/03/16 0503 11/04/16 0358  NA 130* 133*  --  137 133*  K 3.1* 3.7  --  3.7 3.4*  CL 93* 95*  --  95* 94*  CO2 24 20*  --  17* 26  GLUCOSE 345* 104*  --  181* 151*  BUN 48* 7  --  13 29*  CREATININE 5.47* 1.99*  --  3.01* 4.70*  CALCIUM 8.8* 8.9  --  9.3 9.1  MG  --   --  1.7  --   --  PHOS 2.5  --   --  4.6  --     GFR: Estimated Creatinine Clearance: 10.2 mL/min (by C-G formula based on SCr of 4.7 mg/dL (H)).  Liver Function Tests:  Recent Labs Lab 10/31/16 1804 11/02/16 1810 11/03/16 0503  AST  --  28  --   ALT  --  19  --   ALKPHOS  --  68  --   BILITOT  --  1.0  --   PROT  --  7.0  --   ALBUMIN 2.5* 2.8* 3.1*   No results for input(s): LIPASE, AMYLASE in the last 168 hours. No results for input(s): AMMONIA in the last 168 hours.  Coagulation Profile:  Recent Labs Lab 11/01/16 0514 11/02/16 2239 11/03/16 0503 11/04/16 0358 11/05/16 0402  INR 2.33 1.95 1.89 3.14 3.69    Cardiac Enzymes: No results for input(s): CKTOTAL, CKMB, CKMBINDEX, TROPONINI in the last 168 hours.  BNP (last 3 results) No results for input(s): PROBNP in the last 8760 hours.  HbA1C: No  results for input(s): HGBA1C in the last 72 hours.  CBG:  Recent Labs Lab 11/04/16 0801 11/04/16 1134 11/04/16 1536 11/04/16 2003 11/05/16 0804  GLUCAP 144* 197* 269* 347* 256*    Lipid Profile: No results for input(s): CHOL, HDL, LDLCALC, TRIG, CHOLHDL, LDLDIRECT in the last 72 hours.  Thyroid Function Tests: No results for input(s): TSH, T4TOTAL, FREET4, T3FREE, THYROIDAB in the last 72 hours.  Anemia Panel: No results for input(s): VITAMINB12, FOLATE, FERRITIN, TIBC, IRON, RETICCTPCT in the last 72 hours.  Urine analysis:    Component Value Date/Time   COLORURINE YELLOW 10/30/2016 2120   APPEARANCEUR CLOUDY (A) 10/30/2016 2120   LABSPEC 1.021 10/30/2016 2120   PHURINE 5.0 10/30/2016 2120   GLUCOSEU NEGATIVE 10/30/2016 2120   HGBUR NEGATIVE 10/30/2016 2120   BILIRUBINUR SMALL (A) 10/30/2016 2120   [place] NEGATIVE 10/30/2016 2120   PROTEINUR 30 (A) 10/30/2016 2120   NITRITE NEGATIVE 10/30/2016 2120   LEUKOCYTESUR SMALL (A) 10/30/2016 2120    Sepsis Labs: Lactic Acid, Venous    Component Value Date/Time   LATICACIDVEN 0.89 09/10/2016 0736    MICROBIOLOGY: Recent Results (from the past 240 hour(s))  Urine culture     Status: Abnormal   Collection Time: 10/30/16  9:19 PM  Result Value Ref Range Status   Specimen Description URINE, CLEAN CATCH  Final   Special Requests NONE  Final   Culture MULTIPLE SPECIES PRESENT, SUGGEST RECOLLECTION (A)  Final   Report Status 11/01/2016 FINAL  Final  MRSA PCR Screening     Status: Abnormal   Collection Time: 11/03/16  5:42 AM  Result Value Ref Range Status   MRSA by PCR POSITIVE (A) NEGATIVE Final    Comment:        The GeneXpert MRSA Assay (FDA approved for NASAL specimens only), is one component of a comprehensive MRSA colonization surveillance program. It is not intended to diagnose MRSA infection nor to guide or monitor treatment for MRSA infections. RESULT CALLED TO, READ BACK BY AND VERIFIED  WITH: M.SCHMIT RN AT [phone] 11/03/16 BY A.DAVIS     RADIOLOGY STUDIES/RESULTS: Dg Chest 2 View  Result Date: 11/01/2016 CLINICAL DATA:  Wheezing, shortness of breath, cough. EXAM: CHEST  2 VIEW COMPARISON:  Radiographs of October 30, 2016. FINDINGS: Stable cardiomegaly. Status post cardiac valve repair. Left internal jugular dialysis catheter is unchanged in position. No pneumothorax or pleural effusion is noted. No acute pulmonary disease is noted. Bony thorax is  unremarkable. IMPRESSION: No active cardiopulmonary disease. Electronically Signed   By: Marijo Conception, M.D.   On: 11/01/2016 07:25   Dg Chest 2 View  Result Date: 10/30/2016 CLINICAL DATA:  Cough and leukocytosis EXAM: CHEST  2 VIEW COMPARISON:  Chest radiograph October 15, 2016 and chest CT October 21, 2016 FINDINGS: There is persistent consolidation in the medial bases bilaterally, slightly more although left than on the right. No new parenchymal lung opacity evident. Heart is borderline prominent pulmonary vascularity within normal limits. No adenopathy. Port-A-Cath tip is at the cavoatrial junction. No pneumothorax. There is a mitral valve replacement. No bone lesions. IMPRESSION: Bibasilar consolidation. No new opacity. Stable cardiac silhouette. Electronically Signed   By: Lowella Grip III M.D.   On: 10/30/2016 10:46   Dg Chest 2 View  Result Date: 10/15/2016 CLINICAL DATA:  Cough.  Chest pain EXAM: CHEST  2 VIEW COMPARISON:  10/11/2016 FINDINGS: There is a left chest wall dialysis catheter with tips in the cavoatrial junction. Moderate cardiac enlargement. Previous median sternotomy and mitral bowel replacement. No pleural effusion or edema. No airspace opacities. IMPRESSION: 1. No acute cardiopulmonary abnormalities. Electronically Signed   By: Kerby Moors M.D.   On: 10/15/2016 09:13   Dg Chest 2 View  Result Date: 10/11/2016 CLINICAL DATA:  Dyspnea and cough for 1 month. EXAM: CHEST  2 VIEW COMPARISON:   10/08/2016, 08/30/2016 FINDINGS: There is stable cardiomegaly. There is subpleural scarring in the left mid lung and left lung base. There may be a small right lower lobe infiltrate. No pneumothorax, effusion or CHF. Status post median sternotomy and mitral valvular repair. Dual-lumen left jugular central line catheter extends into the right atrium, unchanged in appearance. IMPRESSION: Stable cardiomegaly. Faint opacities in the right lower lobe medially which may reflect a small pneumonic consolidation. Atelectasis and/or scarring at the left lung base. Electronically Signed   By: Ashley Royalty M.D.   On: 10/11/2016 18:24   Ct Soft Tissue Neck Wo Contrast  Result Date: 10/20/2016 CLINICAL DATA:  None. FINDINGS: Pharynx and larynx: Layering secretions in the hypopharynx extending to the bilateral piriform sinus. Normal larynx. Mild proximal tracheal wall irregularity without stenosis. Salivary glands: Normal. Thyroid: Normal. Lymph nodes: No lymphadenopathy by CT size criteria though sensitivity decreased without contrast. Vascular: Retropharyngeal course of the carotid arteries. Tunneled dialysis catheter via LEFT internal jugular venous approach. Limited intracranial: Nonacute. Mild calcific atherosclerosis of the carotid siphons. Visualized orbits: Normal. Mastoids and visualized paranasal sinuses: Mild bilateral maxillary sinus mucosal thickening without air-fluid levels. Mastoid air cells are well aerated. Skeleton: Broad reversed cervical lordosis with moderate to severe C5-6 and C6-7 degenerative discs. Status post median sternotomy. Tooth 8 and LEFT mandible molar periapical lucency/abscess. Upper chest: Lung apices are clear. Markedly enlarged partially imaged main pulmonary artery versus mediastinal mass. Asymmetrically smaller LEFT mainstem bronchus. Other: Tracheostomy scar. IMPRESSION: Mild proximal tracheal wall irregularity without stenosis. Layering secretions in hypopharynx presents aspiration  risk. Asymmetrically smaller LEFT lower lobe mainstem bronchus. Markedly enlarged main pulmonary artery versus mediastinal mass. Recommend CT chest. Electronically Signed   By: Elon Alas M.D.   On: 10/20/2016 18:33   Ct Chest Wo Contrast  Result Date: 10/21/2016 CLINICAL DATA:  60 year old female with possible mediastinal mass identified on neck CT. EXAM: CT CHEST WITHOUT CONTRAST TECHNIQUE: Multidetector CT imaging of the chest was performed following the standard protocol without IV contrast. COMPARISON:  10/20/2016 neck CT. FINDINGS: Cardiovascular: The possible mediastinal mass identified on neck CT represents a mildly  enlarged main pulmonary artery with a diameter of 3.7 cm. Cardiomegaly, mitral valve replacement and left IJ central venous catheter with tip at the superior cavoatrial junction noted. No pericardial effusion. Mediastinum/Nodes: No mass, enlarged lymph nodes identified. Lungs/Pleura: Moderate bilateral lower lobe atelectasis identified. There is no evidence of pleural effusion, pneumothorax or mass. Upper Abdomen: No acute abnormality. Musculoskeletal: Median sternotomy changes noted. No acute bony abnormalities present. IMPRESSION: Mildly enlarged main pulmonary artery (3.7 cm) accounting for possible mediastinal mass identified on recent neck CT. Moderate bilateral lower lobe atelectasis. Cardiomegaly and mitral valve replacement. Electronically Signed   By: Margarette Canada M.D.   On: 10/21/2016 17:34   Dg Chest Port 1 View  Result Date: 11/04/2016 CLINICAL DATA:  Acute respiratory failure with hypoxia. EXAM: PORTABLE CHEST 1 VIEW COMPARISON:  11/02/2016. FINDINGS: Cardiomegaly. Prior median sternotomy for valvular disease. Dual lumen catheter from LEFT IJ approach lies with tips in the proximal RA. No infiltrates or edema. No effusion or pneumothorax. Mild subsegmental atelectasis may be present in the retrocardiac region. IMPRESSION: Cardiomegaly.  Dual lumen catheter in  satisfactory position. Slight increase in retrocardiac opacity could represent subsegmental atelectasis. Electronically Signed   By: Staci Righter M.D.   On: 11/04/2016 11:11   Dg Chest Portable 1 View  Result Date: 11/02/2016 CLINICAL DATA:  Shortness of breath and tachypnea EXAM: PORTABLE CHEST 1 VIEW COMPARISON:  Yesterday FINDINGS: Dialysis catheter on the left with tip at the upper right atrium. Chronic cardiomegaly, status post mitral valve replacement. There is no edema, consolidation, effusion, or pneumothorax. IMPRESSION: No evidence of acute disease. Electronically Signed   By: Monte Fantasia M.D.   On: 11/02/2016 18:29   Dg Chest Port 1 View  Result Date: 10/08/2016 CLINICAL DATA:  Cough and dyspnea EXAM: PORTABLE CHEST 1 VIEW COMPARISON:  10/04/2016 FINDINGS: Dual-lumen left jugular central line extends into the right atrium. Prior sternotomy and cardiac valvuloplasty. Minimal curvilinear scarring or atelectasis in the left base. The lungs are otherwise clear. There is no large effusion. The pulmonary vasculature is normal. Unchanged cardiomegaly. Hilar and mediastinal contours are unremarkable and unchanged. IMPRESSION: Minimal curvilinear scarring or atelectasis in the left base. Unchanged cardiomegaly. Electronically Signed   By: Andreas [place] M.D.   On: 10/08/2016 03:04   Dg Abd Portable 1v  Result Date: 10/07/2016 CLINICAL DATA:  Check feeding catheter placement EXAM: PORTABLE ABDOMEN - 1 VIEW COMPARISON:  None. FINDINGS: Scattered large and small bowel gas is noted. Contrast fecal material is noted throughout the colon. A feeding catheter is noted with the tip in the distal stomach directed towards the pylorus. Postsurgical changes in the chest are noted. IMPRESSION: Feeding catheter in the distal stomach. Electronically Signed   By: Inez Catalina M.D.   On: 10/07/2016 10:53   Dg Swallowing Func-speech Pathology  Result Date: 10/11/2016 Objective Swallowing Evaluation: Type of  Study: MBS-Modified Barium Swallow Study Patient Details Name: TANI VIRGO MRN: [id number] Date of Birth: 1956/05/28 Today's Date: 10/11/2016 Time: SLP Start Time (ACUTE ONLY): 1055-SLP Stop Time (ACUTE ONLY): 1109 SLP Time Calculation (min) (ACUTE ONLY): 14 min Past Medical History: Past Medical History: Diagnosis Date . Diabetes mellitus  . Hyperlipidemia  . Hypertension  Past Surgical History: Past Surgical History: Procedure Laterality Date . AV FISTULA PLACEMENT Left 10/02/2016  Procedure: INSERTION OF ARTERIOVENOUS (AV) GORE-TEX GRAFT ARM;  Surgeon: Waynetta Sandy, MD;  Location: [place];  Service: Vascular;  Laterality: Left; . CARDIAC CATHETERIZATION N/A 08/15/2016  Procedure: Left Heart Cath and  Coronary Angiography;  Surgeon: Yolonda Kida, MD;  Location: [place] CV LAB;  Service: Cardiovascular;  Laterality: N/A; . CARDIAC CATHETERIZATION N/A 08/15/2016  Procedure: Coronary Stent Intervention;  Surgeon: Yolonda Kida, MD;  Location: [place] CV LAB;  Service: Cardiovascular;  Laterality: N/A; . CARDIAC CATHETERIZATION N/A 08/18/2016  Procedure: Right Heart Cath;  Surgeon: Jolaine Artist, MD;  Location: [place] CV LAB;  Service: Cardiovascular;  Laterality: N/A; . CARDIAC CATHETERIZATION N/A 08/18/2016  Procedure: IABP Insertion;  Surgeon: Jolaine Artist, MD;  Location: [place] CV LAB;  Service: Cardiovascular;  Laterality: N/A; . CARDIAC CATHETERIZATION Right 08/23/2016  Procedure: CENTRAL LINE INSERTION RIGHT SUBCLAVIAN;  Surgeon: Ivin Poot, MD;  Location: [place];  Service: Open Heart Surgery;  Laterality: Right; . ENDOVEIN HARVEST OF GREATER SAPHENOUS VEIN Left 08/23/2016  Procedure: ENDOVEIN HARVEST OF GREATER SAPHENOUS VEIN;  Surgeon: Ivin Poot, MD;  Location: [place];  Service: Open Heart Surgery;  Laterality: Left; . INSERTION OF DIALYSIS CATHETER Bilateral 08/31/2016  Procedure: INSERTION OF DIALYSIS CATHETER LEFT INTERNAL JUGULAR VEIN & INSERTION OF  TRIPLE LUMEN RIGHT INTERNAL JUGULAR VEIN;  Surgeon: Angelia Mould, MD;  Location: [place];  Service: Vascular;  Laterality: Bilateral; . INTRAOPERATIVE TRANSESOPHAGEAL ECHOCARDIOGRAM N/A 08/23/2016  Procedure: INTRAOPERATIVE TRANSESOPHAGEAL ECHOCARDIOGRAM;  Surgeon: Ivin Poot, MD;  Location: [place];  Service: Open Heart Surgery;  Laterality: N/A; . MITRAL VALVE REPAIR N/A 08/23/2016  Procedure: MITRAL VALVE REPAIR (MVR) USING 25MM EDWARDS MAGNA EASE BIOPROSTHESIS MITRAL  VALVE;  Surgeon: Ivin Poot, MD;  Location: [place];  Service: Open Heart Surgery;  Laterality: N/A; . VAGINAL DELIVERY    x 6 HPI: 60 yo female [facility] to [facility]15/16 with STEMI s/p cath to [place]. Developed cardiogenic shock, VDRF (ETT 9/14-9/29), AKI with metabolic acidosis and started on CRRT 08/17/16. Transferred to [facility] to tx shock and evaluated new severe MR from papillary muscle rupture. She also had fever and found to have Group B Strep in sputum culture.Mitral valve replacement 9/20. Swallow evaluation 9/30 with recs for NPO given poor mentation, severely impaired swallow function s/p prolonged intubation. Reintubated 9/30 secondary to septic shock; trach 10/5; new onset right hemiplegia and right gaze preference.  CT negative for acute changes; dx acute encephalopathy. MBS on 10/23 and Dys 2 with nectar thick liquids using a chin tuck began. 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 She had a baseline, congested cough throughout MBS that was not associated with aspiration. Mild piecemeal swallowing observed with pureed and regular solids. A delay in swallow initation to the valleculae with reduced  laryngeal closure seen for all PO intake. Given thin liquid, pt had a single event of silent aspiration, but when utilizing a chin tuck intermittent flash penetration occurred with no penetrate/aspirate residuals. Following multiple thin liquid trials, pt's strong cough resulted in retrograde backflow of boluses from the esophagus to the level of the valleculae. Retrograde backflow intermittently occurred into the pharynx with more solid trials; however, her airway remained protected throughout these events. Esophageal sweep showed mild build up of consistencies (MD not present to confirm). Recommend advancement to Dys 3 and thin liquids utilizing a chin tuck for every sip. MD may wish to consider further esophageal assessment given findings on most recent two MBS, which  impacts pt's risk for post-prandial aspiration and may be influencing some of her coughing. Will continue to follow acutely for diet tolerance. Impact on safety and function Mild aspiration risk   CHL IP TREATMENT RECOMMENDATION 10/11/2016 Treatment Recommendations Therapy as outlined in treatment plan below   Prognosis 10/11/2016 Prognosis for Safe Diet Advancement Good Barriers to Reach Goals -- Barriers/Prognosis Comment -- CHL IP DIET RECOMMENDATION 10/11/2016 SLP Diet Recommendations Dysphagia 3 (Mech soft) solids;Thin liquid Liquid Administration via Cup;No straw Medication Administration Crushed with puree Compensations Minimize environmental distractions;Slow rate;Small sips/bites;Chin tuck;Follow solids with liquid Postural Changes Remain semi-upright after after feeds/meals (Comment);Seated upright at 90 degrees   CHL IP OTHER RECOMMENDATIONS 10/11/2016 Recommended Consults -- Oral Care Recommendations Oral care BID Other Recommendations --   CHL IP FOLLOW UP RECOMMENDATIONS 10/11/2016 Follow up Recommendations Inpatient Rehab   CHL IP FREQUENCY AND DURATION 10/11/2016 Speech Therapy Frequency (ACUTE ONLY) min 2x/week Treatment Duration 2 weeks       CHL IP ORAL PHASE 10/11/2016 Oral Phase Impaired Oral - Pudding Teaspoon -- Oral - Pudding Cup -- Oral - Honey Teaspoon -- Oral - Honey Cup -- Oral - Nectar Teaspoon -- Oral - Nectar Cup NT Oral - Nectar Straw NT Oral - Thin Teaspoon NT Oral - Thin Cup WFL Oral - Thin Straw -- Oral - Puree Piecemeal swallowing Oral - Mech Soft NT Oral - Regular Piecemeal swallowing Oral - Multi-Consistency NT Oral - Pill NT Oral Phase - Comment --  CHL IP PHARYNGEAL PHASE 10/11/2016 Pharyngeal Phase Impaired Pharyngeal- Pudding Teaspoon -- Pharyngeal -- Pharyngeal- Pudding Cup -- Pharyngeal -- Pharyngeal- Honey Teaspoon -- Pharyngeal -- Pharyngeal- Honey Cup -- Pharyngeal -- Pharyngeal- Nectar Teaspoon -- Pharyngeal -- Pharyngeal- Nectar Cup NT Pharyngeal -- Pharyngeal- Nectar Straw NT Pharyngeal -- Pharyngeal- Thin Teaspoon NT Pharyngeal -- Pharyngeal- Thin Cup Penetration/Aspiration before swallow;Reduced airway/laryngeal closure;Delayed swallow initiation-vallecula;Compensatory strategies attempted (with notebox) Pharyngeal Material enters airway, passes BELOW cords without attempt by patient to eject out (silent aspiration);Material enters airway, remains ABOVE vocal cords then ejected out Pharyngeal- Thin Straw -- Pharyngeal -- Pharyngeal- Puree Delayed swallow initiation-vallecula;Reduced airway/laryngeal closure Pharyngeal -- Pharyngeal- Mechanical Soft -- Pharyngeal -- Pharyngeal- Regular Delayed swallow initiation-vallecula;Reduced airway/laryngeal closure Pharyngeal -- Pharyngeal- Multi-consistency -- Pharyngeal -- Pharyngeal- Pill NT Pharyngeal -- Pharyngeal Comment --  CHL IP CERVICAL ESOPHAGEAL PHASE 10/11/2016 Cervical Esophageal Phase Impaired Pudding Teaspoon -- Pudding Cup -- Honey Teaspoon -- Honey Cup -- Nectar Teaspoon -- Nectar Cup -- Nectar Straw -- Thin Teaspoon -- Thin Cup Esophageal backflow into the pharynx Thin Straw -- Puree Esophageal backflow into the pharynx Mechanical Soft -- Regular Esophageal  backflow into the pharynx Multi-consistency -- Pill -- Cervical Esophageal Comment -- No flowsheet data found. Germain Osgood 10/11/2016, 1:23 PM  Note populated for Jiles Prows, student SLP Germain Osgood, M.A. CCC-SLP [phone]               LOS: 3 days   Oren Binet, MD  [facility] Pager:[phone]  If 7PM-7AM, please contact night-coverage [email or web address] Password [email or web address] 11/05/2016, 10:53 AM

## 2016-11-06 ENCOUNTER — Other Ambulatory Visit: Payer: Self-pay | Admitting: Otolaryngology

## 2016-11-06 DIAGNOSIS — E118 Type 2 diabetes mellitus with unspecified complications: Secondary | ICD-10-CM

## 2016-11-06 DIAGNOSIS — J383 Other diseases of vocal cords: Secondary | ICD-10-CM

## 2016-11-06 LAB — GLUCOSE, CAPILLARY
GLUCOSE-CAPILLARY: 550 mg/dL — AB (ref 65–99)
GLUCOSE-CAPILLARY: 237 mg/dL — AB (ref 65–99)
GLUCOSE-CAPILLARY: 150 mg/dL — AB (ref 65–99)
GLUCOSE-CAPILLARY: 175 mg/dL — AB (ref 65–99)
Glucose-Capillary: 490 mg/dL — ABNORMAL HIGH (ref 65–99)

## 2016-11-06 LAB — PROTIME-INR
INR: 3.18
PROTHROMBIN TIME: 33.3 s — AB (ref 11.4–15.2)

## 2016-11-06 LAB — CBC
HEMATOCRIT: 36.2 % (ref 36.0–46.0)
Hemoglobin: 11.4 g/dL — ABNORMAL LOW (ref 12.0–15.0)
MCH: 27.5 pg (ref 26.0–34.0)
MCHC: 31.5 g/dL (ref 30.0–36.0)
MCV: 87.4 fL (ref 78.0–100.0)
PLATELETS: 561 10*3/uL — AB (ref 150–400)
RBC: 4.14 MIL/uL (ref 3.87–5.11)
RDW: 18.4 % — AB (ref 11.5–15.5)
WBC: 19 10*3/uL — AB (ref 4.0–10.5)

## 2016-11-06 MED ORDER — SODIUM CHLORIDE 0.9 % IV SOLN
125.0000 mg | INTRAVENOUS | Status: DC
  Administered 2016-11-07 – 2016-11-18 (×5): 125 mg via INTRAVENOUS
  Filled 2016-11-06 (×12): qty 10

## 2016-11-06 MED ORDER — INSULIN ASPART 100 UNIT/ML ~~LOC~~ SOLN
0.0000 [IU] | Freq: Three times a day (TID) | SUBCUTANEOUS | Status: DC
  Administered 2016-11-06: 16 [IU] via SUBCUTANEOUS
  Administered 2016-11-06: 3 [IU] via SUBCUTANEOUS

## 2016-11-06 MED ORDER — INSULIN DETEMIR 100 UNIT/ML ~~LOC~~ SOLN
20.0000 [IU] | Freq: Every day | SUBCUTANEOUS | Status: DC
  Administered 2016-11-06: 20 [IU] via SUBCUTANEOUS
  Filled 2016-11-06: qty 0.2

## 2016-11-06 MED ORDER — INSULIN DETEMIR 100 UNIT/ML ~~LOC~~ SOLN
15.0000 [IU] | Freq: Every day | SUBCUTANEOUS | Status: DC
  Administered 2016-11-06: 15 [IU] via SUBCUTANEOUS
  Filled 2016-11-06 (×2): qty 0.15

## 2016-11-06 MED ORDER — INSULIN ASPART 100 UNIT/ML ~~LOC~~ SOLN
4.0000 [IU] | Freq: Three times a day (TID) | SUBCUTANEOUS | Status: DC
  Administered 2016-11-06: 4 [IU] via SUBCUTANEOUS

## 2016-11-06 MED ORDER — INSULIN ASPART 100 UNIT/ML ~~LOC~~ SOLN
20.0000 [IU] | Freq: Once | SUBCUTANEOUS | Status: AC
  Administered 2016-11-06: 20 [IU] via SUBCUTANEOUS

## 2016-11-06 MED ORDER — PHYTONADIONE 5 MG PO TABS
5.0000 mg | ORAL_TABLET | Freq: Once | ORAL | Status: AC
  Administered 2016-11-06: 5 mg via ORAL
  Filled 2016-11-06: qty 1

## 2016-11-06 NOTE — Telephone Encounter (Signed)
error 

## 2016-11-06 NOTE — Progress Notes (Signed)
NIF and VC done at this time. Pateint weak, and effort not that good. NIF -18 x2, VC 0.4-0.5L x2. BBS clear and diminished. No distress noted

## 2016-11-06 NOTE — Progress Notes (Signed)
Patient performed NIF-20 and VC .45 Patient effort was good. No distress noted at this time.

## 2016-11-06 NOTE — Progress Notes (Signed)
Patient's son has been visiting since after lunchtime. Patient began to c/o anxiety and feeling like she couldn't catch her breath. Lung sounds were unchanged, but stridor sounded worse in patient's upper airway/throat. Patient requested something for anxiety and Xanax was administered at 1357. Dr. Sloan Leiter notified and came to evaluate patient. Xopenex administered at 1437. Dr. Lamonte Sakai also on unit to evaluate patient. During reassessment at 1500, patient was noted to be resting comfortably. Stridor appeared to be slightly decreased.  Joellen Jersey RN.

## 2016-11-06 NOTE — Progress Notes (Signed)
ANTICOAGULATION CONSULT NOTE - Initial Consult  Pharmacy Consult for Heparin Indication: atrial fibrillation  No Known Allergies  Patient Measurements: Height: 5\' 2"  (032.1 cm) Weight: 121 lb 4.1 oz (55 kg) IBW/kg (Calculated) : 50.1 Heparin Dosing Weight:  55kg  Vital Signs: Temp: 98 F (36.7 C) (12/04 0758) Temp Source: Oral (12/04 0758) BP: 129/78 (12/04 0758) Pulse Rate: 90 (12/04 0758)  Labs:  Recent Labs  11/04/16 0358 11/05/16 0402 11/06/16 0348  HGB 9.9* 10.7* 11.4*  HCT 31.5* 33.8* 36.2  PLT 533* 532* 561*  LABPROT 32.9* 37.5* 33.3*  INR 3.14 3.69 3.18  CREATININE 4.70*  --   --     Estimated Creatinine Clearance: 10.2 mL/min (by C-G formula based on SCr of 4.7 mg/dL (H)).   Medical History: Past Medical History:  Diagnosis Date  . Diabetes mellitus   . Hyperlipidemia   . Hypertension   . MI, old   . Renal disorder    dialysls    Assessment: 60 yo F presents on 11/30 with SOB. On Coumadin PTA for Afib, also has a recent MVR.   Anticoag: On Coumadin for Afib. 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 Recent prolonged hospitalization (d/c 11/29 from rehab). Hgb 11.4 improved. INR 3.18 down slightly today. Start IV heparin when INR<2 for trach.  Goal of Therapy:  Heparin level 0.3-0.7 units/ml Monitor platelets by anticoagulation protocol: Yes   Plan:  Start IV heparin when INR<2 Holding Coumadin for trach Daily INR  Kostantinos Tallman S. Alford Highland, PharmD, [facility] Clinical Staff Pharmacist Pager [phone]  [name] 11/06/2016,8:40 AM

## 2016-11-06 NOTE — Progress Notes (Addendum)
PROGRESS NOTE        PATIENT DETAILS Name: Deborah [name] Age: 60 y.o. Sex: female Date of Birth: 03/23/1956 Admit Date: 11/02/2016 Admitting Physician Javier Glazier, MD CBJ:SEGBTD,VVOHYWVP AZBELL, MD  Brief Narrative: Patient is a 60 y.o. female with recent prolonged hospitalization (from 9/15-11/8) for STEMI requiring PCI (at [facility]), complicated by cardiac [facility] shock and acute hypoxemic respiratory failure requiring ventilator support due to papillary muscle rupture causing severe MR. She was transferred to [facility], and underwent mitral valve replacement on 9/20. Postoperatively, she developed septic shock secondary to healthcare associated pneumonia, atrial flutter requiring cardioversion, and unfortunately developed renal failure and is now ESRD and dependent on dialysis. She subsequently required a tracheostomy. She was transferred to  [facility] on 11/8. She was subsequently stabilized and discharged home on 11/29, she is now status post decannulation of her recent tracheostomy. 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 ENT on board and in discussion with family regarding timing of tracheostomy  SIGNIFICANT EVENTS  09/12 Admit to [facility] 09/15 To [facility] 09/20 MVR with bioprosthetic valve 09/28 Permcath for HD 09/29 Extubated 09/30 Reintubated 10/05 Trach by Hyman Bible 10/09 Off vent 10/25 decannulated 11/8 to CIR 11/29 DC CIR 11/30 admit for resp distress  Subjective: Continues to cough- she has had d/w family-and is now willing to proceed with tracheostomy  Assessment/Plan: Dyspnea:felt to be secondary to vocal cord dysfunction. Recent CT neck on 11/17 did not show any tracheal stenosi.ENT in discussion with family regarding tracheostomy-and after much  discussion with pharmacy she is now willing to proceed with a tracheostomy. She currently is stable, I do not hear any stridor on my exam this morning-she still has some amount of coughing.I have spoken with Dr Jeneen Rinks made him aware of the patient's decision to proceed with tracheostomy-Plans are to continue with steroids, nebs and await further recommendations from ENT regarding timing of Tracheostomy.   Paroxysmal atrial fibrillation: Appears to be sinus rhythm, continue amiodarone for rate control-INR still around 3-plans are to hold Coumadin in anticipation of tracheostomy.If INR falls <2 will need to start Heparin.    Addendum Will give one dose of Vit K 5 mg, repeat INR tomorrow, in anticipation of trach  Recent Inferior posterior STEMI on 9/13 with DES x 2 to OM-1: Continue Plavix and statin-denies chest pain. Shortness of breath likely secondary to vocal cord dysfunction.  Severe ischemic mitral regurgitation due to ruptured posterior papillary muscle in setting of inferoposterior STEMI:s/p MVR 08/23/2016   ESRD: Unfortunately developed renal failure during her most recent long hospitalization-now ESRD on hemodialysis TTS, nephrology following.  Chronic hypotension: On Midodrine  Insulin dependent type 2 diabetes: CBGs elevated-on steroids, increase Levemir to 20 units, continue SSI. Follow CBGs and adjust accordingly.  Anemia: Probably secondary to chronic disease/ESRD, defer to nephrology  Mild leukocytosis: Probably secondary to steroids, no indication of infection-monitor off antibiotics  DVT Prophylaxis: Full dose anticoagulation with Coumadin  Code Status: Full code   Family Communication: None at [place] conversation with daughter and patient yesterday  Addendum 2:50 pm Long d/w son at bedside-he has questions mostly to with etiology of vocal cord dysfunction-which I am  unable to answer, have advised him to ask ENT.I had  Furthermore, he is contemplating  signing out AMA and going to [facility]-I have advised against this.   Patient also seems to be slightly more anxious than yesterday-she is coughing a bit more-and has some intermittent stridor today-have spoken with Dr Lamonte Sakai PCCM-who will eyeball the patient, he reviewed ENT consult note and advised to continue current care.   Disposition Plan: Remain inpatient-remain in SDU-suspect will require several more days of hospitalization  Antimicrobial agents: None  Procedures: None  CONSULTS:  pulmonary/intensive care, nephrology and ENT  Time spent: 25 minutes-Greater than 50% of this time was spent in counseling, explanation of diagnosis, planning of further management, and coordination of care.  MEDICATIONS: Anti-infectives    None      Scheduled Meds: . amiodarone  200 mg Oral Daily  . atorvastatin  20 mg Oral q1800  . Chlorhexidine Gluconate Cloth  6 each Topical Q0600  . clopidogrel  75 mg Oral Daily  . darbepoetin (ARANESP) injection - DIALYSIS  60 mcg Intravenous Q Sat-HD  . feeding supplement (NEPRO CARB STEADY)  237 mL Oral BID BM  . fluticasone  2 spray Each Nare Daily  . insulin aspart  0-15 Units Subcutaneous TID WC  . insulin aspart  0-5 Units Subcutaneous QHS  . insulin detemir  15 Units Subcutaneous Q2200  . lanthanum  1,000 mg Oral TID WC  . loratadine  10 mg Oral Daily  . methylPREDNISolone (SOLU-MEDROL) injection  40 mg Intravenous Q12H  . metoCLOPramide  5 mg Oral BID WC  . midodrine  10 mg Oral q morning - 10a  . multivitamin  1 tablet Oral QHS  . mupirocin ointment  1 application Nasal BID  . pantoprazole  40 mg Oral BID  . sodium chloride flush  3 mL Intravenous Q12H  . Warfarin - Pharmacist Dosing Inpatient   Does not apply q1800   Continuous Infusions: PRN Meds:.sodium chloride, sodium chloride, ALPRAZolam, alteplase, heparin, heparin, levalbuterol, lidocaine (PF), lidocaine-prilocaine, pentafluoroprop-tetrafluoroeth   PHYSICAL EXAM: Vital  signs: Vitals:   11/05/16 2157 11/05/16 2247 11/05/16 2250 11/06/16 0420  BP:  (!) 106/57 (!) 106/57 127/71  Pulse:  76 77 83  Resp:  (!) 22 (!) 21 18  Temp:  97.6 F (36.4 C)  98.2 F (36.8 C)  TempSrc:  Oral  Oral  SpO2: 100% 99% 100% 99%  Weight:    55 kg (121 lb 4.1 oz)  Height:       Filed Weights   11/04/16 2230 11/05/16 0251 11/06/16 0420  Weight: 54.7 kg (120 lb 9.5 oz) 54 kg (119 lb 0.8 oz) 55 kg (121 lb 4.1 oz)   Body mass index is 22.18 kg/m.   General appearance :Awake, alert, not in any distress.  Eyes:, pupils equally reactive to light and accomodation,no scleral icterus. HEENT: Atraumatic and Normocephalic Neck: supple, no JVD. No cervical lymphadenopathy.  Resp:Good air entry bilaterally, no added sounds  CVS: S1 S2 regular GI: Bowel sounds present, Non tender and not distended with no gaurding, rigidity or rebound.No organomegaly Extremities: B/L Lower Ext shows no edema, both legs are warm to touch Neurology:  speech clear,Non focal, sensation is grossly intact. Psychiatric: Normal judgment and insight. Alert and oriented x 3.  Musculoskeletal:No digital cyanosis Skin:No Rash, warm and dry Wounds:N/A  I have personally reviewed following labs and imaging studies  LABORATORY DATA: CBC:  Recent Labs Lab 11/02/16 1810 11/03/16 0503 11/04/16 0358 11/05/16 0402 11/06/16 1308  WBC 13.9* 14.4* 16.7* 18.9* 19.0*  NEUTROABS 10.5*  --   --   --   --   HGB 11.3* 11.6* 9.9* 10.7* 11.4*  HCT 36.4 37.7 31.5* 33.8* 36.2  MCV 87.7 89.1 86.5 87.1 87.4  PLT 513* 491* 533* 532* 561*    Basic Metabolic Panel:  Recent Labs Lab 10/31/16 1804 11/02/16 1810 11/02/16 1823 11/03/16 0503 11/04/16 0358  NA 130* 133*  --  137 133*  K 3.1* 3.7  --  3.7 3.4*  CL 93* 95*  --  95* 94*  CO2 24 20*  --  17* 26  GLUCOSE 345* 104*  --  181* 151*  BUN 48* 7  --  13 29*  CREATININE 5.47* 1.99*  --  3.01* 4.70*  CALCIUM 8.8* 8.9  --  9.3 9.1  MG  --   --  1.7  --    --   PHOS 2.5  --   --  4.6  --     GFR: Estimated Creatinine Clearance: 10.2 mL/min (by C-G formula based on SCr of 4.7 mg/dL (H)).  Liver Function Tests:  Recent Labs Lab 10/31/16 1804 11/02/16 1810 11/03/16 0503  AST  --  28  --   ALT  --  19  --   ALKPHOS  --  68  --   BILITOT  --  1.0  --   PROT  --  7.0  --   ALBUMIN 2.5* 2.8* 3.1*   No results for input(s): LIPASE, AMYLASE in the last 168 hours. No results for input(s): AMMONIA in the last 168 hours.  Coagulation Profile:  Recent Labs Lab 11/02/16 2239 11/03/16 0503 11/04/16 0358 11/05/16 0402 11/06/16 0348  INR 1.95 1.89 3.14 3.69 3.18    Cardiac Enzymes: No results for input(s): CKTOTAL, CKMB, CKMBINDEX, TROPONINI in the last 168 hours.  BNP (last 3 results) No results for input(s): PROBNP in the last 8760 hours.  HbA1C: No results for input(s): HGBA1C in the last 72 hours.  CBG:  Recent Labs Lab 11/04/16 2003 11/05/16 0804 11/05/16 1209 11/05/16 1637 11/05/16 2130  GLUCAP 347* 256* 373* 285* 380*    Lipid Profile: No results for input(s): CHOL, HDL, LDLCALC, TRIG, CHOLHDL, LDLDIRECT in the last 72 hours.  Thyroid Function Tests: No results for input(s): TSH, T4TOTAL, FREET4, T3FREE, THYROIDAB in the last 72 hours.  Anemia Panel: No results for input(s): VITAMINB12, FOLATE, FERRITIN, TIBC, IRON, RETICCTPCT in the last 72 hours.  Urine analysis:    Component Value Date/Time   COLORURINE YELLOW 10/30/2016 2120   APPEARANCEUR CLOUDY (A) 10/30/2016 2120   LABSPEC 1.021 10/30/2016 2120   PHURINE 5.0 10/30/2016 2120   GLUCOSEU NEGATIVE 10/30/2016 2120   HGBUR NEGATIVE 10/30/2016 2120   BILIRUBINUR SMALL (A) 10/30/2016 2120   [place] NEGATIVE 10/30/2016 2120   PROTEINUR 30 (A) 10/30/2016 2120   NITRITE NEGATIVE 10/30/2016 2120   LEUKOCYTESUR SMALL (A) 10/30/2016 2120    Sepsis Labs: Lactic Acid, Venous    Component Value Date/Time   LATICACIDVEN 0.89 09/10/2016 0736     MICROBIOLOGY: Recent Results (from the past 240 hour(s))  Urine culture     Status: Abnormal   Collection Time: 10/30/16  9:19 PM  Result Value Ref Range Status   Specimen Description URINE, CLEAN CATCH  Final   Special Requests NONE  Final   Culture MULTIPLE SPECIES PRESENT, SUGGEST RECOLLECTION (A)  Final   Report Status 11/01/2016 FINAL  Final  MRSA PCR Screening  Status: Abnormal   Collection Time: 11/03/16  5:42 AM  Result Value Ref Range Status   MRSA by PCR POSITIVE (A) NEGATIVE Final    Comment:        The GeneXpert MRSA Assay (FDA approved for NASAL specimens only), is one component of a comprehensive MRSA colonization surveillance program. It is not intended to diagnose MRSA infection nor to guide or monitor treatment for MRSA infections. RESULT CALLED TO, READ BACK BY AND VERIFIED WITH: M.SCHMIT RN AT [phone] 11/03/16 BY A.DAVIS     RADIOLOGY STUDIES/RESULTS: Dg Chest 2 View  Result Date: 11/01/2016 CLINICAL DATA:  Wheezing, shortness of breath, cough. EXAM: CHEST  2 VIEW COMPARISON:  Radiographs of October 30, 2016. FINDINGS: Stable cardiomegaly. Status post cardiac valve repair. Left internal jugular dialysis catheter is unchanged in position. No pneumothorax or pleural effusion is noted. No acute pulmonary disease is noted. Bony thorax is unremarkable. IMPRESSION: No active cardiopulmonary disease. Electronically Signed   By: Marijo Conception, M.D.   On: 11/01/2016 07:25   Dg Chest 2 View  Result Date: 10/30/2016 CLINICAL DATA:  Cough and leukocytosis EXAM: CHEST  2 VIEW COMPARISON:  Chest radiograph October 15, 2016 and chest CT October 21, 2016 FINDINGS: There is persistent consolidation in the medial bases bilaterally, slightly more although left than on the right. No new parenchymal lung opacity evident. Heart is borderline prominent pulmonary vascularity within normal limits. No adenopathy. Port-A-Cath tip is at the cavoatrial junction. No pneumothorax.  There is a mitral valve replacement. No bone lesions. IMPRESSION: Bibasilar consolidation. No new opacity. Stable cardiac silhouette. Electronically Signed   By: Lowella Grip III M.D.   On: 10/30/2016 10:46   Dg Chest 2 View  Result Date: 10/15/2016 CLINICAL DATA:  Cough.  Chest pain EXAM: CHEST  2 VIEW COMPARISON:  10/11/2016 FINDINGS: There is a left chest wall dialysis catheter with tips in the cavoatrial junction. Moderate cardiac enlargement. Previous median sternotomy and mitral bowel replacement. No pleural effusion or edema. No airspace opacities. IMPRESSION: 1. No acute cardiopulmonary abnormalities. Electronically Signed   By: Kerby Moors M.D.   On: 10/15/2016 09:13   Dg Chest 2 View  Result Date: 10/11/2016 CLINICAL DATA:  Dyspnea and cough for 1 month. EXAM: CHEST  2 VIEW COMPARISON:  10/08/2016, 08/30/2016 FINDINGS: There is stable cardiomegaly. There is subpleural scarring in the left mid lung and left lung base. There may be a small right lower lobe infiltrate. No pneumothorax, effusion or CHF. Status post median sternotomy and mitral valvular repair. Dual-lumen left jugular central line catheter extends into the right atrium, unchanged in appearance. IMPRESSION: Stable cardiomegaly. Faint opacities in the right lower lobe medially which may reflect a small pneumonic consolidation. Atelectasis and/or scarring at the left lung base. Electronically Signed   By: Ashley Royalty M.D.   On: 10/11/2016 18:24   Ct Soft Tissue Neck Wo Contrast  Result Date: 10/20/2016 CLINICAL DATA:  None. FINDINGS: Pharynx and larynx: Layering secretions in the hypopharynx extending to the bilateral piriform sinus. Normal larynx. Mild proximal tracheal wall irregularity without stenosis. Salivary glands: Normal. Thyroid: Normal. Lymph nodes: No lymphadenopathy by CT size criteria though sensitivity decreased without contrast. Vascular: Retropharyngeal course of the carotid arteries. Tunneled dialysis  catheter via LEFT internal jugular venous approach. Limited intracranial: Nonacute. Mild calcific atherosclerosis of the carotid siphons. Visualized orbits: Normal. Mastoids and visualized paranasal sinuses: Mild bilateral maxillary sinus mucosal thickening without air-fluid levels. Mastoid air cells are well aerated. Skeleton: Broad reversed  cervical lordosis with moderate to severe C5-6 and C6-7 degenerative discs. Status post median sternotomy. Tooth 8 and LEFT mandible molar periapical lucency/abscess. Upper chest: Lung apices are clear. Markedly enlarged partially imaged main pulmonary artery versus mediastinal mass. Asymmetrically smaller LEFT mainstem bronchus. Other: Tracheostomy scar. IMPRESSION: Mild proximal tracheal wall irregularity without stenosis. Layering secretions in hypopharynx presents aspiration risk. Asymmetrically smaller LEFT lower lobe mainstem bronchus. Markedly enlarged main pulmonary artery versus mediastinal mass. Recommend CT chest. Electronically Signed   By: Elon Alas M.D.   On: 10/20/2016 18:33   Ct Chest Wo Contrast  Result Date: 10/21/2016 CLINICAL DATA:  60 year old female with possible mediastinal mass identified on neck CT. EXAM: CT CHEST WITHOUT CONTRAST TECHNIQUE: Multidetector CT imaging of the chest was performed following the standard protocol without IV contrast. COMPARISON:  10/20/2016 neck CT. FINDINGS: Cardiovascular: The possible mediastinal mass identified on neck CT represents a mildly enlarged main pulmonary artery with a diameter of 3.7 cm. Cardiomegaly, mitral valve replacement and left IJ central venous catheter with tip at the superior cavoatrial junction noted. No pericardial effusion. Mediastinum/Nodes: No mass, enlarged lymph nodes identified. Lungs/Pleura: Moderate bilateral lower lobe atelectasis identified. There is no evidence of pleural effusion, pneumothorax or mass. Upper Abdomen: No acute abnormality. Musculoskeletal: Median sternotomy  changes noted. No acute bony abnormalities present. IMPRESSION: Mildly enlarged main pulmonary artery (3.7 cm) accounting for possible mediastinal mass identified on recent neck CT. Moderate bilateral lower lobe atelectasis. Cardiomegaly and mitral valve replacement. Electronically Signed   By: Margarette Canada M.D.   On: 10/21/2016 17:34   Dg Chest Port 1 View  Result Date: 11/04/2016 CLINICAL DATA:  Acute respiratory failure with hypoxia. EXAM: PORTABLE CHEST 1 VIEW COMPARISON:  11/02/2016. FINDINGS: Cardiomegaly. Prior median sternotomy for valvular disease. Dual lumen catheter from LEFT IJ approach lies with tips in the proximal RA. No infiltrates or edema. No effusion or pneumothorax. Mild subsegmental atelectasis may be present in the retrocardiac region. IMPRESSION: Cardiomegaly.  Dual lumen catheter in satisfactory position. Slight increase in retrocardiac opacity could represent subsegmental atelectasis. Electronically Signed   By: Staci Righter M.D.   On: 11/04/2016 11:11   Dg Chest Portable 1 View  Result Date: 11/02/2016 CLINICAL DATA:  Shortness of breath and tachypnea EXAM: PORTABLE CHEST 1 VIEW COMPARISON:  Yesterday FINDINGS: Dialysis catheter on the left with tip at the upper right atrium. Chronic cardiomegaly, status post mitral valve replacement. There is no edema, consolidation, effusion, or pneumothorax. IMPRESSION: No evidence of acute disease. Electronically Signed   By: Monte Fantasia M.D.   On: 11/02/2016 18:29   Dg Chest Port 1 View  Result Date: 10/08/2016 CLINICAL DATA:  Cough and dyspnea EXAM: PORTABLE CHEST 1 VIEW COMPARISON:  10/04/2016 FINDINGS: Dual-lumen left jugular central line extends into the right atrium. Prior sternotomy and cardiac valvuloplasty. Minimal curvilinear scarring or atelectasis in the left base. The lungs are otherwise clear. There is no large effusion. The pulmonary vasculature is normal. Unchanged cardiomegaly. Hilar and mediastinal contours are  unremarkable and unchanged. IMPRESSION: Minimal curvilinear scarring or atelectasis in the left base. Unchanged cardiomegaly. Electronically Signed   By: Andreas Newport M.D.   On: 10/08/2016 03:04   Dg Abd Portable 1v  Result Date: 10/07/2016 CLINICAL DATA:  Check feeding catheter placement EXAM: PORTABLE ABDOMEN - 1 VIEW COMPARISON:  None. FINDINGS: Scattered large and small bowel gas is noted. Contrast fecal material is noted throughout the colon. A feeding catheter is noted with the tip in the distal stomach  directed towards the pylorus. Postsurgical changes in the chest are noted. IMPRESSION: Feeding catheter in the distal stomach. Electronically Signed   By: Inez Catalina M.D.   On: 10/07/2016 10:53   Dg Swallowing Func-speech Pathology  Result Date: 10/11/2016 Objective Swallowing Evaluation: Type of Study: MBS-Modified Barium Swallow Study Patient Details Name: PORCHIA SINKLER MRN: [id number] Date of Birth: 02-25-1956 Today's Date: 10/11/2016 Time: SLP Start Time (ACUTE ONLY): 1055-SLP Stop Time (ACUTE ONLY): 1109 SLP Time Calculation (min) (ACUTE ONLY): 14 min Past Medical History: Past Medical History: Diagnosis Date . Diabetes mellitus  . Hyperlipidemia  . Hypertension  Past Surgical History: Past Surgical History: Procedure Laterality Date . AV FISTULA PLACEMENT Left 10/02/2016  Procedure: INSERTION OF ARTERIOVENOUS (AV) GORE-TEX GRAFT ARM;  Surgeon: Waynetta Sandy, MD;  Location: [place];  Service: Vascular;  Laterality: Left; . CARDIAC CATHETERIZATION N/A 08/15/2016  Procedure: Left Heart Cath and Coronary Angiography;  Surgeon: Yolonda Kida, MD;  Location: [place] CV LAB;  Service: Cardiovascular;  Laterality: N/A; . CARDIAC CATHETERIZATION N/A 08/15/2016  Procedure: Coronary Stent Intervention;  Surgeon: Yolonda Kida, MD;  Location: [place] CV LAB;  Service: Cardiovascular;  Laterality: N/A; . CARDIAC CATHETERIZATION N/A 08/18/2016  Procedure: Right Heart Cath;   Surgeon: Jolaine Artist, MD;  Location: [place] CV LAB;  Service: Cardiovascular;  Laterality: N/A; . CARDIAC CATHETERIZATION N/A 08/18/2016  Procedure: IABP Insertion;  Surgeon: Jolaine Artist, MD;  Location: [place] CV LAB;  Service: Cardiovascular;  Laterality: N/A; . CARDIAC CATHETERIZATION Right 08/23/2016  Procedure: CENTRAL LINE INSERTION RIGHT SUBCLAVIAN;  Surgeon: Ivin Poot, MD;  Location: [place];  Service: Open Heart Surgery;  Laterality: Right; . ENDOVEIN HARVEST OF GREATER SAPHENOUS VEIN Left 08/23/2016  Procedure: ENDOVEIN HARVEST OF GREATER SAPHENOUS VEIN;  Surgeon: Ivin Poot, MD;  Location: [place];  Service: Open Heart Surgery;  Laterality: Left; . INSERTION OF DIALYSIS CATHETER Bilateral 08/31/2016  Procedure: INSERTION OF DIALYSIS CATHETER LEFT INTERNAL JUGULAR VEIN & INSERTION OF TRIPLE LUMEN RIGHT INTERNAL JUGULAR VEIN;  Surgeon: Angelia Mould, MD;  Location: [place];  Service: Vascular;  Laterality: Bilateral; . INTRAOPERATIVE TRANSESOPHAGEAL ECHOCARDIOGRAM N/A 08/23/2016  Procedure: INTRAOPERATIVE TRANSESOPHAGEAL ECHOCARDIOGRAM;  Surgeon: Ivin Poot, MD;  Location: [place];  Service: Open Heart Surgery;  Laterality: N/A; . MITRAL VALVE REPAIR N/A 08/23/2016  Procedure: MITRAL VALVE REPAIR (MVR) USING 25MM EDWARDS MAGNA EASE BIOPROSTHESIS MITRAL  VALVE;  Surgeon: Ivin Poot, MD;  Location: [place];  Service: Open Heart Surgery;  Laterality: N/A; . VAGINAL DELIVERY    x 6 HPI: 60 yo female admitted to [facility] 08/15/16 with STEMI s/p cath to [place]. Developed cardiogenic shock, VDRF (ETT 9/14-9/29), AKI with metabolic acidosis and started on CRRT 08/17/16. Transferred to [facility] to tx shock and evaluated new severe MR from papillary muscle rupture. She also had fever and found to have Group B Strep in sputum culture.Mitral valve replacement 9/20. Swallow evaluation 9/30 with recs for NPO given poor mentation, severely impaired swallow function s/p prolonged intubation.  Reintubated 9/30 secondary to septic shock; trach 10/5; new onset right hemiplegia and right gaze preference.  CT negative for acute changes; dx acute encephalopathy. MBS on 10/23 and Dys 2 with nectar thick liquids using a chin tuck began. Subjective: alert, participatory Assessment / Plan / Recommendation CHL IP CLINICAL IMPRESSIONS 10/11/2016 Therapy Diagnosis Mild pharyngeal phase dysphagia;Suspected primary esophageal dysphagia;Mild cervical esophageal phase dysphagia Clinical Impression Pt presents with mild pharyngeal and mild cervical esophageal dysphagia,  with suspected primary esophageal dysphagia. She had a baseline, congested cough throughout MBS that was not associated with aspiration. Mild piecemeal swallowing observed with pureed and regular solids. A delay in swallow initation to the valleculae with reduced laryngeal closure seen for all PO intake. Given thin liquid, pt had a single event of silent aspiration, but when utilizing a chin tuck intermittent flash penetration occurred with no penetrate/aspirate residuals. Following multiple thin liquid trials, pt's strong cough resulted in retrograde backflow of boluses from the esophagus to the level of the valleculae. Retrograde backflow intermittently occurred into the pharynx with more solid trials; however, her airway remained protected throughout these events. Esophageal sweep showed mild build up of consistencies (MD not present to confirm). Recommend advancement to Dys 3 and thin liquids utilizing a chin tuck for every sip. MD may wish to consider further esophageal assessment given findings on most recent two MBS, which impacts pt's risk for post-prandial aspiration and may be influencing some of her coughing. Will continue to follow acutely for diet tolerance. Impact on safety and function Mild aspiration risk   CHL IP TREATMENT RECOMMENDATION 10/11/2016 Treatment Recommendations Therapy as outlined in treatment plan below   Prognosis 10/11/2016  Prognosis for Safe Diet Advancement Good Barriers to Reach Goals -- Barriers/Prognosis Comment -- CHL IP DIET RECOMMENDATION 10/11/2016 SLP Diet Recommendations Dysphagia 3 (Mech soft) solids;Thin liquid Liquid Administration via Cup;No straw Medication Administration Crushed with puree Compensations Minimize environmental distractions;Slow rate;Small sips/bites;Chin tuck;Follow solids with liquid Postural Changes Remain semi-upright after after feeds/meals (Comment);Seated upright at 90 degrees   CHL IP OTHER RECOMMENDATIONS 10/11/2016 Recommended Consults -- Oral Care Recommendations Oral care BID Other Recommendations --   CHL IP FOLLOW UP RECOMMENDATIONS 10/11/2016 Follow up Recommendations Inpatient Rehab   CHL IP FREQUENCY AND DURATION 10/11/2016 Speech Therapy Frequency (ACUTE ONLY) min 2x/week Treatment Duration 2 weeks      CHL IP ORAL PHASE 10/11/2016 Oral Phase Impaired Oral - Pudding Teaspoon -- Oral - Pudding Cup -- Oral - Honey Teaspoon -- Oral - Honey Cup -- Oral - Nectar Teaspoon -- Oral - Nectar Cup NT Oral - Nectar Straw NT Oral - Thin Teaspoon NT Oral - Thin Cup WFL Oral - Thin Straw -- Oral - Puree Piecemeal swallowing Oral - Mech Soft NT Oral - Regular Piecemeal swallowing Oral - Multi-Consistency NT Oral - Pill NT Oral Phase - Comment --  CHL IP PHARYNGEAL PHASE 10/11/2016 Pharyngeal Phase Impaired Pharyngeal- Pudding Teaspoon -- Pharyngeal -- Pharyngeal- Pudding Cup -- Pharyngeal -- Pharyngeal- Honey Teaspoon -- Pharyngeal -- Pharyngeal- Honey Cup -- Pharyngeal -- Pharyngeal- Nectar Teaspoon -- Pharyngeal -- Pharyngeal- Nectar Cup NT Pharyngeal -- Pharyngeal- Nectar Straw NT Pharyngeal -- Pharyngeal- Thin Teaspoon NT Pharyngeal -- Pharyngeal- Thin Cup Penetration/Aspiration before swallow;Reduced airway/laryngeal closure;Delayed swallow initiation-vallecula;Compensatory strategies attempted (with notebox) Pharyngeal Material enters airway, passes BELOW cords without attempt by patient to eject out  (silent aspiration);Material enters airway, remains ABOVE vocal cords then ejected out Pharyngeal- Thin Straw -- Pharyngeal -- Pharyngeal- Puree Delayed swallow initiation-vallecula;Reduced airway/laryngeal closure Pharyngeal -- Pharyngeal- Mechanical Soft -- Pharyngeal -- Pharyngeal- Regular Delayed swallow initiation-vallecula;Reduced airway/laryngeal closure Pharyngeal -- Pharyngeal- Multi-consistency -- Pharyngeal -- Pharyngeal- Pill NT Pharyngeal -- Pharyngeal Comment --  CHL IP CERVICAL ESOPHAGEAL PHASE 10/11/2016 Cervical Esophageal Phase Impaired Pudding Teaspoon -- Pudding Cup -- Honey Teaspoon -- Honey Cup -- Nectar Teaspoon -- Nectar Cup -- Nectar Straw -- Thin Teaspoon -- Thin Cup Esophageal backflow into the pharynx Thin Straw -- Puree Esophageal backflow into the pharynx  Mechanical Soft -- Regular Esophageal backflow into the pharynx Multi-consistency -- Pill -- Cervical Esophageal Comment -- No flowsheet data found. Germain Osgood 10/11/2016, 1:23 PM  Note populated for Jiles Prows, student SLP Germain Osgood, M.A. CCC-SLP [phone]               LOS: 4 days   Oren Binet, MD  [facility] Pager:[phone]  If 7PM-7AM, please contact night-coverage [email or web address] Password [email or web address] 11/06/2016, 7:59 AM

## 2016-11-06 NOTE — Progress Notes (Signed)
Subjective: feeling a little better today, cough/ congestion has improved some, no new c/o   Objective Vital signs in last 24 hours: Vitals:   11/05/16 2247 11/05/16 2250 11/06/16 0420 11/06/16 0758  BP: (!) 106/57 (!) 106/57 127/71 129/78  Pulse: 76 77 83 90  Resp: (!) 22 (!) 21 18 (!) 21  Temp: 97.6 F (36.4 C)  98.2 F (36.8 C) 98 F (36.7 C)  TempSrc: Oral  Oral Oral  SpO2: 99% 100% 99% 100%  Weight:   55 kg (121 lb 4.1 oz)   Height:       Weight change: -1.7 kg (-3 lb 12 oz) Physical Exam: General: alert thin AAF chronically ill  , NAD  OX3   Heart: RRR no mur, rub or gallop Lungs: clear lungs post to bases bilat; insp wheezing resolved, +coarse rhonchi off and on  Abdomen: bs pos  Soft , NT, ND, Extremities: no pedal edema Dialysis Access:  L IJ Perm cath /pos bruit LUA AVG   Op Dialysis orders=  [place] unit  ( Dr. Juleen China follows/ [facility] )4hrs, 2k, 2.5 ca bath  [facility] perm cath, LUA AVF (11/02/16 ) no treatment meds listed  Don't have a dry wt, she was only at OP unit one day then readmitted   Problem/Plan: 1. Cough/ dyspnea - per ENT upper airway issues/ stridor. See their detailed note.  On steroids, seems better today, discussions ongoing- pt seems to be leaningtoward having trach placed 2. ESRD - recent start in hosp 3 mos; TTS schedule ( Dr. [facility]), will use avgg starting tomorrow. Had only one OP dialysis before being readmitted  3. Volume - don't have a dry wt, euvol on exam at [place] today 4. Anemia - hgb 11.6>9.6, now 11.4   On darbe, will do iron as well  5. Secondary hyperparathyroidism -  Fosrenol binder/ no op vit d  Needed with PTH 199  6. HTN/volume - was dc'd home on midodrine 10 qam, have d/cd 7. Paroxysmal atrial fibrillation  -per admit amiodarone,warfarin/ NS  On exam /tele. 8. HO recent NSTEMI with shock 9. 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 DM type 2 - per admit 11. Elytes- k lowish- run on  higher K bath, low sodium indicates more fluid to take- phos OK  Deborah [name] A  11/06/2016, 8:27 AM    Labs: Basic Metabolic Panel:  Recent Labs Lab 10/31/16 1804 11/02/16 1810 11/03/16 0503 11/04/16 0358  NA 130* 133* 137 133*  K 3.1* 3.7 3.7 3.4*  CL 93* 95* 95* 94*  CO2 24 20* 17* 26  GLUCOSE 345* 104* 181* 151*  BUN 48* 7 13 29*  CREATININE 5.47* 1.99* 3.01* 4.70*  CALCIUM 8.8* 8.9 9.3 9.1  PHOS 2.5  --  4.6  --    Liver Function Tests:  Recent Labs Lab 10/31/16 1804 11/02/16 1810 11/03/16 0503  AST  --  28  --   ALT  --  19  --   ALKPHOS  --  68  --   BILITOT  --  1.0  --   PROT  --  7.0  --   ALBUMIN 2.5* 2.8* 3.1*   No results for input(s): LIPASE, AMYLASE in the last 168 hours. No results for input(s): AMMONIA in the last 168 hours. CBC:  Recent Labs Lab 11/02/16 1810 11/03/16 0503 11/04/16 0358 11/05/16 0402 11/06/16 0348  WBC 13.9* 14.4* 16.7* 18.9* 19.0*  NEUTROABS 10.5*  --   --   --   --  HGB 11.3* 11.6* 9.9* 10.7* 11.4*  HCT 36.4 37.7 31.5* 33.8* 36.2  MCV 87.7 89.1 86.5 87.1 87.4  PLT 513* 491* 533* 532* 561*   Cardiac Enzymes: No results for input(s): CKTOTAL, CKMB, CKMBINDEX, TROPONINI in the last 168 hours. CBG:  Recent Labs Lab 11/05/16 0804 11/05/16 1209 11/05/16 1637 11/05/16 2130 11/06/16 0757  GLUCAP 256* 373* 285* 380* 490*    Studies/Results: No results found. Medications:  . amiodarone  200 mg Oral Daily  . atorvastatin  20 mg Oral q1800  . Chlorhexidine Gluconate Cloth  6 each Topical Q0600  . clopidogrel  75 mg Oral Daily  . darbepoetin (ARANESP) injection - DIALYSIS  60 mcg Intravenous Q Sat-HD  . feeding supplement (NEPRO CARB STEADY)  237 mL Oral BID BM  . fluticasone  2 spray Each Nare Daily  . insulin aspart  0-15 Units Subcutaneous TID WC  . insulin aspart  0-5 Units Subcutaneous QHS  . insulin detemir  20 Units Subcutaneous Q2200  . lanthanum  1,000 mg Oral TID WC  . loratadine  10 mg  Oral Daily  . methylPREDNISolone (SOLU-MEDROL) injection  40 mg Intravenous Q12H  . metoCLOPramide  5 mg Oral BID WC  . midodrine  10 mg Oral q morning - 10a  . multivitamin  1 tablet Oral QHS  . mupirocin ointment  1 application Nasal BID  . pantoprazole  40 mg Oral BID  . sodium chloride flush  3 mL Intravenous Q12H  . Warfarin - Pharmacist Dosing Inpatient   Does not apply [phone]

## 2016-11-07 ENCOUNTER — Other Ambulatory Visit: Payer: Self-pay | Admitting: Cardiothoracic Surgery

## 2016-11-07 ENCOUNTER — Inpatient Hospital Stay (HOSPITAL_COMMUNITY): Payer: Medicaid Other

## 2016-11-07 DIAGNOSIS — Z951 Presence of aortocoronary bypass graft: Secondary | ICD-10-CM

## 2016-11-07 LAB — CBC
HCT: 37.5 % (ref 36.0–46.0)
HCT: 42 % (ref 36.0–46.0)
HEMOGLOBIN: 12 g/dL (ref 12.0–15.0)
Hemoglobin: 13.7 g/dL (ref 12.0–15.0)
MCH: 27.8 pg (ref 26.0–34.0)
MCH: 28.3 pg (ref 26.0–34.0)
MCHC: 32 g/dL (ref 30.0–36.0)
MCHC: 32.6 g/dL (ref 30.0–36.0)
MCV: 86.8 fL (ref 78.0–100.0)
MCV: 86.8 fL (ref 78.0–100.0)
PLATELETS: 462 10*3/uL — AB (ref 150–400)
Platelets: 509 10*3/uL — ABNORMAL HIGH (ref 150–400)
RBC: 4.32 MIL/uL (ref 3.87–5.11)
RBC: 4.84 MIL/uL (ref 3.87–5.11)
RDW: 18.5 % — ABNORMAL HIGH (ref 11.5–15.5)
RDW: 19.4 % — AB (ref 11.5–15.5)
WBC: 31.9 10*3/uL — AB (ref 4.0–10.5)
WBC: 27.1 10*3/uL — AB (ref 4.0–10.5)

## 2016-11-07 LAB — RENAL FUNCTION PANEL
ANION GAP: 15 (ref 5–15)
Albumin: 2.5 g/dL — ABNORMAL LOW (ref 3.5–5.0)
BUN: 70 mg/dL — ABNORMAL HIGH (ref 6–20)
CHLORIDE: 91 mmol/L — AB (ref 101–111)
CO2: 22 mmol/L (ref 22–32)
CREATININE: 7.04 mg/dL — AB (ref 0.44–1.00)
Calcium: 9 mg/dL (ref 8.9–10.3)
GFR, EST AFRICAN AMERICAN: 7 mL/min — AB (ref >60)
GFR, EST NON AFRICAN AMERICAN: 6 mL/min — AB (ref >60)
Glucose, Bld: 388 mg/dL — ABNORMAL HIGH (ref 65–99)
POTASSIUM: 3.9 mmol/L (ref 3.5–5.1)
Phosphorus: 3.7 mg/dL (ref 2.5–4.6)
Sodium: 128 mmol/L — ABNORMAL LOW (ref 135–145)

## 2016-11-07 LAB — GLUCOSE, CAPILLARY
GLUCOSE-CAPILLARY: 153 mg/dL — AB (ref 65–99)
GLUCOSE-CAPILLARY: 125 mg/dL — AB (ref 65–99)
GLUCOSE-CAPILLARY: 126 mg/dL — AB (ref 65–99)
Glucose-Capillary: 164 mg/dL — ABNORMAL HIGH (ref 65–99)
Glucose-Capillary: 210 mg/dL — ABNORMAL HIGH (ref 65–99)
Glucose-Capillary: 158 mg/dL — ABNORMAL HIGH (ref 65–99)

## 2016-11-07 LAB — TROPONIN I: TROPONIN I: 0.07 ng/mL — AB (ref <0.03)

## 2016-11-07 LAB — BLOOD GAS, ARTERIAL
ACID-BASE EXCESS: 4.1 mmol/L — AB (ref 0.0–2.0)
BICARBONATE: 27.1 mmol/L (ref 20.0–28.0)
DRAWN BY: 406621
O2 CONTENT: 2 L/min
O2 SAT: 91.2 %
PATIENT TEMPERATURE: 98.6
PH ART: 7.522 — AB (ref 7.350–7.450)
pCO2 arterial: 33.2 mmHg (ref 32.0–48.0)
pO2, Arterial: 58.6 mmHg — ABNORMAL LOW (ref 83.0–108.0)

## 2016-11-07 LAB — COMPREHENSIVE METABOLIC PANEL
ALT: 21 U/L (ref 14–54)
AST: 22 U/L (ref 15–41)
Albumin: 3.1 g/dL — ABNORMAL LOW (ref 3.5–5.0)
Alkaline Phosphatase: 69 U/L (ref 38–126)
Anion gap: 16 — ABNORMAL HIGH (ref 5–15)
BUN: 20 mg/dL (ref 6–20)
CHLORIDE: 93 mmol/L — AB (ref 101–111)
CO2: 25 mmol/L (ref 22–32)
Calcium: 9.2 mg/dL (ref 8.9–10.3)
Creatinine, Ser: 3.16 mg/dL — ABNORMAL HIGH (ref 0.44–1.00)
GFR calc Af Amer: 17 mL/min — ABNORMAL LOW (ref >60)
GFR, EST NON AFRICAN AMERICAN: 15 mL/min — AB (ref >60)
Glucose, Bld: 147 mg/dL — ABNORMAL HIGH (ref 65–99)
POTASSIUM: 3.2 mmol/L — AB (ref 3.5–5.1)
SODIUM: 134 mmol/L — AB (ref 135–145)
Total Bilirubin: 0.9 mg/dL (ref 0.3–1.2)
Total Protein: 7.5 g/dL (ref 6.5–8.1)

## 2016-11-07 LAB — PROTIME-INR
INR: 2.63
INR: 1.8
PROTHROMBIN TIME: 28.6 s — AB (ref 11.4–15.2)
PROTHROMBIN TIME: 21.2 s — AB (ref 11.4–15.2)

## 2016-11-07 MED ORDER — INSULIN DETEMIR 100 UNIT/ML ~~LOC~~ SOLN
10.0000 [IU] | Freq: Every day | SUBCUTANEOUS | Status: DC
  Administered 2016-11-07: 10 [IU] via SUBCUTANEOUS
  Filled 2016-11-07 (×2): qty 0.1

## 2016-11-07 MED ORDER — ALPRAZOLAM 0.5 MG PO TABS
0.5000 mg | ORAL_TABLET | Freq: Three times a day (TID) | ORAL | Status: DC | PRN
  Administered 2016-11-07: 0.5 mg via ORAL
  Filled 2016-11-07: qty 1

## 2016-11-07 MED ORDER — INSULIN ASPART 100 UNIT/ML ~~LOC~~ SOLN
0.0000 [IU] | Freq: Three times a day (TID) | SUBCUTANEOUS | Status: DC
  Administered 2016-11-07: 2 [IU] via SUBCUTANEOUS
  Administered 2016-11-08: 3 [IU] via SUBCUTANEOUS
  Administered 2016-11-08: 5 [IU] via SUBCUTANEOUS
  Administered 2016-11-08: 3 [IU] via SUBCUTANEOUS

## 2016-11-07 MED ORDER — MENTHOL 3 MG MT LOZG
1.0000 | LOZENGE | OROMUCOSAL | Status: DC | PRN
  Filled 2016-11-07: qty 9

## 2016-11-07 MED ORDER — LORAZEPAM 2 MG/ML IJ SOLN
1.0000 mg | Freq: Once | INTRAMUSCULAR | Status: AC
  Administered 2016-11-07: 1 mg via INTRAVENOUS
  Filled 2016-11-07: qty 1

## 2016-11-07 MED ORDER — HEPARIN (PORCINE) IN NACL 100-0.45 UNIT/ML-% IJ SOLN
950.0000 [IU]/h | INTRAMUSCULAR | Status: DC
  Administered 2016-11-07: 800 [IU]/h via INTRAVENOUS
  Administered 2016-11-09 – 2016-11-10 (×2): 900 [IU]/h via INTRAVENOUS
  Administered 2016-11-11: 950 [IU]/h via INTRAVENOUS
  Filled 2016-11-07 (×7): qty 250

## 2016-11-07 MED ORDER — INSULIN DETEMIR 100 UNIT/ML ~~LOC~~ SOLN
15.0000 [IU] | Freq: Every day | SUBCUTANEOUS | Status: DC
  Administered 2016-11-07: 15 [IU] via SUBCUTANEOUS
  Filled 2016-11-07: qty 0.15

## 2016-11-07 MED ORDER — MORPHINE SULFATE (PF) 2 MG/ML IV SOLN
1.0000 mg | INTRAVENOUS | Status: DC | PRN
  Administered 2016-11-10: 1 mg via INTRAVENOUS
  Filled 2016-11-07: qty 1

## 2016-11-07 MED ORDER — HEPARIN SODIUM (PORCINE) 1000 UNIT/ML DIALYSIS: 20.0000 [IU]/kg | INTRAMUSCULAR | Status: DC | PRN

## 2016-11-07 MED ORDER — LORAZEPAM 2 MG/ML IJ SOLN
0.5000 mg | Freq: Four times a day (QID) | INTRAMUSCULAR | Status: DC | PRN
  Administered 2016-11-07: 0.5 mg via INTRAVENOUS
  Filled 2016-11-07: qty 1

## 2016-11-07 MED ORDER — METHYLPREDNISOLONE SODIUM SUCC 40 MG IJ SOLR
40.0000 mg | INTRAMUSCULAR | Status: DC
  Administered 2016-11-07: 40 mg via INTRAVENOUS
  Filled 2016-11-07: qty 1

## 2016-11-07 NOTE — Progress Notes (Signed)
NIF -28, VC 0.55L, Moderate effort from pt, however doing coughing pre and post. RT will continue to monitor.

## 2016-11-07 NOTE — Progress Notes (Signed)
Subjective: stable overnight  Objective Vital signs in last 24 hours: Vitals:   11/07/16 0225 11/07/16 0500 11/07/16 0505 11/07/16 0804  BP: (!) 141/71  119/72 134/75  Pulse: 94  88 88  Resp: (!) 28  (!) 25 20  Temp: 97.5 F (36.4 C)  98.1 F (36.7 C) 97.8 F (36.6 C)  TempSrc: Oral   Oral  SpO2: 97%  99% 100%  Weight:  54.5 kg (120 lb 2.4 oz)    Height:       Weight change: -0.5 kg (-1 lb 1.6 oz) Physical Exam: General: alert thin AAF chronically ill  , NAD  OX3  - pleasant Heart: RRR no mur, rub or gallop Lungs: clear lungs post to bases bilat; insp wheezing resolved, +coarse rhonchi off and on  Abdomen: bs pos  Soft , NT, ND, Extremities: no pedal edema Dialysis Access:  L IJ Perm cath /pos bruit LUA AVG   Op Dialysis orders=  [place] unit  ( Dr. Juleen China follows/ [facility] )4hrs, 2k, 2.5 ca bath  LIJ perm cath, LUA AVF (11/02/16 ) no treatment meds listed  Don't have a dry wt, she was only at OP unit one day then readmitted   Problem/Plan: 1. Cough/ dyspnea - per ENT upper airway issues/ stridor. See their detailed note.  On steroids, seems better today, discussions ongoing- pt seems to be leaningtoward having trach placed- is scheduled for Thursday  2. ESRD - recent start in hosp 3 mos; TTS schedule ( Dr. [facility]), will use avgg starting today. Had only one OP dialysis before being readmitted  3. Volume - don't have a dry wt, euvol on exam at [place] today 4. Anemia - hgb 11.6>9.6, now 12 ?   On darbe 60 q week  and iron 5. Secondary hyperparathyroidism -  Fosrenol binder/ no op vit d  needed with PTH 199  6. HTN/volume - was sent home on midodrine 10 qam, have d/cd 7. Paroxysmal atrial fibrillation  -per admit amiodarone,warfarin/ NS  On exam /tele. 8. HO recent NSTEMI with shock 9. 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 DM type 2 - per admit 11. Elytes- k lowish- run on higher K bath, low sodium indicates more fluid to  take- phos OK  Delle Andrzejewski A  11/07/2016, 8:11 AM    Labs: Basic Metabolic Panel:  Recent Labs Lab 10/31/16 1804 11/02/16 1810 11/03/16 0503 11/04/16 0358  NA 130* 133* 137 133*  K 3.1* 3.7 3.7 3.4*  CL 93* 95* 95* 94*  CO2 24 20* 17* 26  GLUCOSE 345* 104* 181* 151*  BUN 48* 7 13 29*  CREATININE 5.47* 1.99* 3.01* 4.70*  CALCIUM 8.8* 8.9 9.3 9.1  PHOS 2.5  --  4.6  --    Liver Function Tests:  Recent Labs Lab 10/31/16 1804 11/02/16 1810 11/03/16 0503  AST  --  28  --   ALT  --  19  --   ALKPHOS  --  68  --   BILITOT  --  1.0  --   PROT  --  7.0  --   ALBUMIN 2.5* 2.8* 3.1*   No results for input(s): LIPASE, AMYLASE in the last 168 hours. No results for input(s): AMMONIA in the last 168 hours. CBC:  Recent Labs Lab 11/02/16 1810 11/03/16 0503 11/04/16 0358 11/05/16 0402 11/06/16 0348 11/07/16 0332  WBC 13.9* 14.4* 16.7* 18.9* 19.0* 31.9*  NEUTROABS 10.5*  --   --   --   --   --  HGB 11.3* 11.6* 9.9* 10.7* 11.4* 12.0  HCT 36.4 37.7 31.5* 33.8* 36.2 37.5  MCV 87.7 89.1 86.5 87.1 87.4 86.8  PLT 513* 491* 533* 532* 561* 509*   Cardiac Enzymes: No results for input(s): CKTOTAL, CKMB, CKMBINDEX, TROPONINI in the last 168 hours. CBG:  Recent Labs Lab 11/06/16 1414 11/06/16 1636 11/06/16 2116 11/07/16 0506 11/07/16 0802  GLUCAP 237* 150* 175* 164* 210*    Studies/Results: No results found. Medications:  . amiodarone  200 mg Oral Daily  . atorvastatin  20 mg Oral q1800  . clopidogrel  75 mg Oral Daily  . darbepoetin (ARANESP) injection - DIALYSIS  60 mcg Intravenous Q Sat-HD  . feeding supplement (NEPRO CARB STEADY)  237 mL Oral BID BM  . ferric gluconate (FERRLECIT/NULECIT) IV  125 mg Intravenous Q T,Th,Sa-HD  . fluticasone  2 spray Each Nare Daily  . insulin aspart  0-15 Units Subcutaneous TID WC  . insulin aspart  0-5 Units Subcutaneous QHS  . insulin detemir  10 Units Subcutaneous Daily  . insulin detemir  15 Units Subcutaneous  Q2200  . lanthanum  1,000 mg Oral TID WC  . loratadine  10 mg Oral Daily  . methylPREDNISolone (SOLU-MEDROL) injection  40 mg Intravenous Q24H  . metoCLOPramide  5 mg Oral BID WC  . midodrine  10 mg Oral q morning - 10a  . multivitamin  1 tablet Oral QHS  . mupirocin ointment  1 application Nasal BID  . pantoprazole  40 mg Oral BID  . sodium chloride flush  3 mL Intravenous Q12H  . Warfarin - Pharmacist Dosing Inpatient   Does not apply [phone]

## 2016-11-07 NOTE — Procedures (Signed)
Patient was seen on dialysis and the procedure was supervised.  BFR 300  Via AVG BP is  116/72.   Patient appears to be tolerating treatment well  Deborah [name] A 11/07/2016

## 2016-11-07 NOTE — Progress Notes (Signed)
Dialysis treatment completed.  2500 mL ultrafiltrated and net fluid removal 2000 mL.    Patient status unchanged. Lung sounds diminished, coarse to ausculation in all fields. No edema. Cardiac: ST.  Disconnected lines and removed needles.  Pressure held for 10 minutes and band aid/gauze dressing applied.  Report given to bedside RN, Judson Roch.

## 2016-11-07 NOTE — Significant Event (Signed)
Rapid Response Event Note  Overview:  Called by RN to evaluate patient for increased WOB and tachycardia Time Called: 1820 Arrival Time: 1822 Event Type: Respiratory, Cardiac  Initial Focused Assessment:  Called for evaluation of patient with increased WOB and tachycardia.  On my arrival to patients room, MD and family at bedside.  Patient trying to eat dinner on arrival. RN states she has been having trouble breathing since returning from HD at ~1530.  She has received xanax and ativan with no relief.  She appears to be anxious. Breath Sounds are clear   Interventions:  118/90, 126, 42 92% EKG done, ABG done.  PCXR and labs ordered.  Blood culture ordered  Plan of Care (if not transferred):  RN to monitor and follow up on labs results.    Event Summary:  RN to call if assistance needed   at      at          [facility], Harlin Rain

## 2016-11-07 NOTE — Progress Notes (Signed)
ANTICOAGULATION CONSULT NOTE - f/u Consult  Pharmacy Consult for Heparin Indication: atrial fibrillation  No Known Allergies  Patient Measurements: Height: 5\' 2"  (157.5 cm) Weight: 115 lb 11.9 oz (52.5 kg) IBW/kg (Calculated) : 50.1 Heparin Dosing Weight:  55kg  Vital Signs: Temp: 98 F (36.7 C) (12/05 1457) Temp Source: Oral (12/05 0804) BP: 153/87 (12/05 1500) Pulse Rate: 107 (12/05 1500)  Labs:  Recent Labs  11/05/16 0402 11/06/16 0348 11/07/16 0332 11/07/16 1110  HGB 10.7* 11.4* 12.0  --   HCT 33.8* 36.2 37.5  --   PLT 532* 561* 509*  --   LABPROT 37.5* 33.3* 28.6*  --   INR 3.69 3.18 2.63  --   CREATININE  --   --   --  7.04*    Estimated Creatinine Clearance: 6.8 mL/min (by C-G formula based on SCr of 7.04 mg/dL (H)).   Medical History: Past Medical History:  Diagnosis Date  . Diabetes mellitus   . Hyperlipidemia   . Hypertension   . MI, old   . Renal disorder    dialysls    Assessment: 60 yo F presents on 11/30 with SOB. On Coumadin PTA for Afib, also has a recent MVR.   Anticoag: On Coumadin for Afib. 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 Recent prolonged hospitalization (d/c 11/29 from rehab). Hgb 12 improved. INR 3.18>2.63 today. Start IV heparin when INR<2 for trach. - Vitamin K 5mg  po x 1 on 12/4  Goal of Therapy:  Heparin level 0.3-0.7 units/ml Monitor platelets by anticoagulation protocol: Yes   Plan:  Start IV heparin when INR<2 Holding Coumadin for trach Daily INR  Reuven Braver S. Alford Highland, PharmD, BCPS Clinical Staff Pharmacist Pager [phone]  [name] 11/07/2016,3:41 PM

## 2016-11-07 NOTE — Progress Notes (Signed)
Informed by RN that patient continues to be anxious, tachycardic I had seen patient earlier, thought to have symptoms very typical of her usual anxiety/panic attack-had tried Xanax and then Ativan without any response. She still is anxious, sinus tachycardic to the 130-140's-and tachypneic.BP is stable, is mentating well and was trying to eat dinner when I walked in. She has no obvious stridor on exam  Will recheck labs/ABG/CXR Will try one additional dose of ATivan Asked PCCM-Dr Nelda Marseille for further recommendations-plans are to await labs and then touch base with PCCM if needed  Discussed with daughter at bedside

## 2016-11-07 NOTE — Progress Notes (Addendum)
PROGRESS NOTE        PATIENT DETAILS Name: Deborah [name] Age: 60 y.o. Sex: female Date of Birth: Apr 07, 1956 Admit Date: 11/02/2016 Admitting Physician Javier Glazier, MD DGU:[id number] AZBELL, MD  Brief Narrative: Patient is a 60 y.o. female with recent prolonged hospitalization (from 9/15-11/8) for STEMI requiring PCI (at [facility]), complicated by cardiac [facility] shock and acute hypoxemic respiratory failure requiring ventilator support due to papillary muscle rupture causing severe MR. She was transferred to [facility], and underwent mitral valve replacement on 9/20. Postoperatively, she developed septic shock secondary to healthcare associated pneumonia, atrial flutter requiring cardioversion, and unfortunately developed renal failure and is now ESRD and dependent on dialysis. She subsequently required a tracheostomy. She was transferred to  [facility] on 11/8. She was subsequently stabilized and discharged home on 11/29, she is now status post decannulation of her recent tracheostomy. 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 ENT on board and in discussion with family, tentatively tracheostomy scheduled for 12/7  SIGNIFICANT EVENTS  09/12 Admit to [facility] 09/15 To [facility] 09/20 MVR with bioprosthetic valve 09/28 Permcath for HD 09/29 Extubated 09/30 Reintubated 10/05 Trach by [facility] Bible 10/09 Off vent 10/25 decannulated 11/8 to CIR 11/29 DC CIR 11/30 admit for resp distress  Subjective: Continues to cough- I do not hear any wheezing this morning. she has had d/w family-and is now willing to proceed with tracheostomy  Assessment/Plan: Dyspnea:felt to be secondary to vocal cord dysfunction. Recent CT neck on 11/17 did not show any tracheal stenosi.ENT in  discussion with family regarding tracheostomy-and after much discussion with pharmacy she is now willing to proceed with a tracheostomy, per ENT-scheduled tentatively for 12/7. Will taper down steroids continue nebs and other supportive measures.   Leukocytosis: No indication of infection, remains afebrile and nontoxic appearing. I suspect this is secondary to steroids, and taper down steroids and follow CBC.  Paroxysmal atrial fibrillation: Appears to be sinus rhythm, continue amiodarone for rate control-INR decreasing-given 5 mg of vitamin K yesterday, has not had any dosing of Coumadin since hospitalization. Will allow INR to drift down, repeat INR tomorrow. Should be in range for surgery by 12/7.If INR falls <2 will need to start Heparin.    Recent Inferior posterior STEMI on 9/13 with DES x 2 to OM-1: Continue Plavix and statin-denies chest pain. Shortness of breath likely secondary to vocal cord dysfunction.  Severe ischemic mitral regurgitation due to ruptured posterior papillary muscle in setting of inferoposterior STEMI:s/p MVR 08/23/2016   ESRD: Unfortunately developed renal failure during her most recent long hospitalization-now ESRD on hemodialysis TTS, nephrology following.  Chronic hypotension: On Midodrine  Insulin dependent type 2 diabetes: CBGs elevated-on steroids, increase Levemir to 20 units, continue SSI. Follow CBGs and adjust accordingly.  Anemia: Probably secondary to chronic disease/ESRD, defer to nephrology  Anxiety: continue Xanax-follow  Addendum 5:30 pm Informed by RN patient having a panic attack-seen at bedside-claims her current symptoms are very typical of her usual anxiety attack=lungs are clear-there is no inspiratory stridor. Will increase Xanax to 0.5 mg TID prn, and add Ativan if still not relieved. Will follow.  Mild leukocytosis: Probably secondary to steroids, no indication  of infection-monitor off antibiotics  DVT Prophylaxis: Therapeutic  INR-coumadin on hold  Code Status: Full code   Family Communication: Daughter at bedside-she does not   Disposition Plan: Remain inpatient-remain in SDU-suspect will require several more days of hospitalization  Antimicrobial agents: None  Procedures: None  CONSULTS:  pulmonary/intensive care, nephrology and ENT  Time spent: 25 minutes-Greater than 50% of this time was spent in counseling, explanation of diagnosis, planning of further management, and coordination of care.  MEDICATIONS: Anti-infectives    None      Scheduled Meds: . amiodarone  200 mg Oral Daily  . atorvastatin  20 mg Oral q1800  . clopidogrel  75 mg Oral Daily  . darbepoetin (ARANESP) injection - DIALYSIS  60 mcg Intravenous Q Sat-HD  . feeding supplement (NEPRO CARB STEADY)  237 mL Oral BID BM  . ferric gluconate (FERRLECIT/NULECIT) IV  125 mg Intravenous Q T,Th,Sa-HD  . fluticasone  2 spray Each Nare Daily  . insulin aspart  0-15 Units Subcutaneous TID WC  . insulin aspart  0-5 Units Subcutaneous QHS  . insulin detemir  10 Units Subcutaneous Daily  . insulin detemir  15 Units Subcutaneous Q2200  . lanthanum  1,000 mg Oral TID WC  . loratadine  10 mg Oral Daily  . methylPREDNISolone (SOLU-MEDROL) injection  40 mg Intravenous Q24H  . metoCLOPramide  5 mg Oral BID WC  . midodrine  10 mg Oral q morning - 10a  . multivitamin  1 tablet Oral QHS  . mupirocin ointment  1 application Nasal BID  . pantoprazole  40 mg Oral BID  . sodium chloride flush  3 mL Intravenous Q12H  . Warfarin - Pharmacist Dosing Inpatient   Does not apply q1800   Continuous Infusions: PRN Meds:.sodium chloride, sodium chloride, ALPRAZolam, alteplase, heparin, heparin, heparin, levalbuterol, lidocaine (PF), lidocaine-prilocaine, menthol-cetylpyridinium, pentafluoroprop-tetrafluoroeth   PHYSICAL EXAM: Vital signs: Vitals:   11/07/16 0804 11/07/16 1042 11/07/16 1057 11/07/16 1130  BP: 134/75 (!) 114/59 123/67 109/64  Pulse:  88 79 80 80  Resp: 20 20    Temp: 97.8 F (36.6 C) 98 F (36.7 C)    TempSrc: Oral     SpO2: 100%     Weight:      Height:       Filed Weights   11/05/16 0251 11/06/16 0420 11/07/16 0500  Weight: 54 kg (119 lb 0.8 oz) 55 kg (121 lb 4.1 oz) 54.5 kg (120 lb 2.4 oz)   Body mass index is 21.98 kg/m.   General appearance :Awake, alert, not in any distress.  Eyes:, pupils equally reactive to light and accomodation,no scleral icterus. HEENT: Atraumatic and Normocephalic Neck: supple, no JVD. No cervical lymphadenopathy.  Resp:Good air entry bilaterally, no added sounds  CVS: S1 S2 regular GI: Bowel sounds present, Non tender and not distended with no gaurding, rigidity or rebound.No organomegaly Extremities: B/L Lower Ext shows no edema, both legs are warm to touch Neurology:  speech clear,Non focal, sensation is grossly intact. Psychiatric: Normal judgment and insight. Alert and oriented x 3.  Musculoskeletal:No digital cyanosis Skin:No Rash, warm and dry Wounds:N/A  I have personally reviewed following labs and imaging studies  LABORATORY DATA: CBC:  Recent Labs Lab 11/02/16 1810 11/03/16 0503 11/04/16 0358 11/05/16 0402 11/06/16 0348 11/07/16 0332  WBC 13.9* 14.4* 16.7* 18.9* 19.0* 31.9*  NEUTROABS 10.5*  --   --   --   --   --   HGB 11.3* 11.6* 9.9* 10.7* 11.4* 12.0  HCT 36.4  37.7 31.5* 33.8* 36.2 37.5  MCV 87.7 89.1 86.5 87.1 87.4 86.8  PLT 513* 491* 533* 532* 561* 509*    Basic Metabolic Panel:  Recent Labs Lab 10/31/16 1804 11/02/16 1810 11/02/16 1823 11/03/16 0503 11/04/16 0358 11/07/16 1110  NA 130* 133*  --  137 133* 128*  K 3.1* 3.7  --  3.7 3.4* 3.9  CL 93* 95*  --  95* 94* 91*  CO2 24 20*  --  17* 26 22  GLUCOSE 345* 104*  --  181* 151* 388*  BUN 48* 7  --  13 29* 70*  CREATININE 5.47* 1.99*  --  3.01* 4.70* 7.04*  CALCIUM 8.8* 8.9  --  9.3 9.1 9.0  MG  --   --  1.7  --   --   --   PHOS 2.5  --   --  4.6  --  3.7    GFR: Estimated  Creatinine Clearance: 6.8 mL/min (by C-G formula based on SCr of 7.04 mg/dL (H)).  Liver Function Tests:  Recent Labs Lab 10/31/16 1804 11/02/16 1810 11/03/16 0503 11/07/16 1110  AST  --  28  --   --   ALT  --  19  --   --   ALKPHOS  --  68  --   --   BILITOT  --  1.0  --   --   PROT  --  7.0  --   --   ALBUMIN 2.5* 2.8* 3.1* 2.5*   No results for input(s): LIPASE, AMYLASE in the last 168 hours. No results for input(s): AMMONIA in the last 168 hours.  Coagulation Profile:  Recent Labs Lab 11/03/16 0503 11/04/16 0358 11/05/16 0402 11/06/16 0348 11/07/16 0332  INR 1.89 3.14 3.69 3.18 2.63    Cardiac Enzymes: No results for input(s): CKTOTAL, CKMB, CKMBINDEX, TROPONINI in the last 168 hours.  BNP (last 3 results) No results for input(s): PROBNP in the last 8760 hours.  HbA1C: No results for input(s): HGBA1C in the last 72 hours.  CBG:  Recent Labs Lab 11/06/16 1414 11/06/16 1636 11/06/16 2116 11/07/16 0506 11/07/16 0802  GLUCAP 237* 150* 175* 164* 210*    Lipid Profile: No results for input(s): CHOL, HDL, LDLCALC, TRIG, CHOLHDL, LDLDIRECT in the last 72 hours.  Thyroid Function Tests: No results for input(s): TSH, T4TOTAL, FREET4, T3FREE, THYROIDAB in the last 72 hours.  Anemia Panel: No results for input(s): VITAMINB12, FOLATE, FERRITIN, TIBC, IRON, RETICCTPCT in the last 72 hours.  Urine analysis:    Component Value Date/Time   COLORURINE YELLOW 10/30/2016 2120   APPEARANCEUR CLOUDY (A) 10/30/2016 2120   LABSPEC 1.021 10/30/2016 2120   PHURINE 5.0 10/30/2016 2120   GLUCOSEU NEGATIVE 10/30/2016 2120   HGBUR NEGATIVE 10/30/2016 2120   BILIRUBINUR SMALL (A) 10/30/2016 2120   [place] NEGATIVE 10/30/2016 2120   PROTEINUR 30 (A) 10/30/2016 2120   NITRITE NEGATIVE 10/30/2016 2120   LEUKOCYTESUR SMALL (A) 10/30/2016 2120    Sepsis Labs: Lactic Acid, Venous    Component Value Date/Time   LATICACIDVEN 0.89 09/10/2016 0736     MICROBIOLOGY: Recent Results (from the past 240 hour(s))  Urine culture     Status: Abnormal   Collection Time: 10/30/16  9:19 PM  Result Value Ref Range Status   Specimen Description URINE, CLEAN CATCH  Final   Special Requests NONE  Final   Culture MULTIPLE SPECIES PRESENT, SUGGEST RECOLLECTION (A)  Final   Report Status 11/01/2016 FINAL  Final  MRSA PCR  Screening     Status: Abnormal   Collection Time: 11/03/16  5:42 AM  Result Value Ref Range Status   MRSA by PCR POSITIVE (A) NEGATIVE Final    Comment:        The GeneXpert MRSA Assay (FDA approved for NASAL specimens only), is one component of a comprehensive MRSA colonization surveillance program. It is not intended to diagnose MRSA infection nor to guide or monitor treatment for MRSA infections. RESULT CALLED TO, READ BACK BY AND VERIFIED WITH: M.SCHMIT RN AT [phone] 11/03/16 BY A.DAVIS     RADIOLOGY STUDIES/RESULTS: Dg Chest 2 View  Result Date: 11/01/2016 CLINICAL DATA:  Wheezing, shortness of breath, cough. EXAM: CHEST  2 VIEW COMPARISON:  Radiographs of October 30, 2016. FINDINGS: Stable cardiomegaly. Status post cardiac valve repair. Left internal jugular dialysis catheter is unchanged in position. No pneumothorax or pleural effusion is noted. No acute pulmonary disease is noted. Bony thorax is unremarkable. IMPRESSION: No active cardiopulmonary disease. Electronically Signed   By: Marijo Conception, M.D.   On: 11/01/2016 07:25   Dg Chest 2 View  Result Date: 10/30/2016 CLINICAL DATA:  Cough and leukocytosis EXAM: CHEST  2 VIEW COMPARISON:  Chest radiograph October 15, 2016 and chest CT October 21, 2016 FINDINGS: There is persistent consolidation in the medial bases bilaterally, slightly more although left than on the right. No new parenchymal lung opacity evident. Heart is borderline prominent pulmonary vascularity within normal limits. No adenopathy. Port-A-Cath tip is at the cavoatrial junction. No pneumothorax.  There is a mitral valve replacement. No bone lesions. IMPRESSION: Bibasilar consolidation. No new opacity. Stable cardiac silhouette. Electronically Signed   By: Lowella Grip III M.D.   On: 10/30/2016 10:46   Dg Chest 2 View  Result Date: 10/15/2016 CLINICAL DATA:  Cough.  Chest pain EXAM: CHEST  2 VIEW COMPARISON:  10/11/2016 FINDINGS: There is a left chest wall dialysis catheter with tips in the cavoatrial junction. Moderate cardiac enlargement. Previous median sternotomy and mitral bowel replacement. No pleural effusion or edema. No airspace opacities. IMPRESSION: 1. No acute cardiopulmonary abnormalities. Electronically Signed   By: Kerby Moors M.D.   On: 10/15/2016 09:13   Dg Chest 2 View  Result Date: 10/11/2016 CLINICAL DATA:  Dyspnea and cough for 1 month. EXAM: CHEST  2 VIEW COMPARISON:  10/08/2016, 08/30/2016 FINDINGS: There is stable cardiomegaly. There is subpleural scarring in the left mid lung and left lung base. There may be a small right lower lobe infiltrate. No pneumothorax, effusion or CHF. Status post median sternotomy and mitral valvular repair. Dual-lumen left jugular central line catheter extends into the right atrium, unchanged in appearance. IMPRESSION: Stable cardiomegaly. Faint opacities in the right lower lobe medially which may reflect a small pneumonic consolidation. Atelectasis and/or scarring at the left lung base. Electronically Signed   By: Ashley Royalty M.D.   On: 10/11/2016 18:24   Ct Soft Tissue Neck Wo Contrast  Result Date: 10/20/2016 CLINICAL DATA:  None. FINDINGS: Pharynx and larynx: Layering secretions in the hypopharynx extending to the bilateral piriform sinus. Normal larynx. Mild proximal tracheal wall irregularity without stenosis. Salivary glands: Normal. Thyroid: Normal. Lymph nodes: No lymphadenopathy by CT size criteria though sensitivity decreased without contrast. Vascular: Retropharyngeal course of the carotid arteries. Tunneled dialysis  catheter via LEFT internal jugular venous approach. Limited intracranial: Nonacute. Mild calcific atherosclerosis of the carotid siphons. Visualized orbits: Normal. Mastoids and visualized paranasal sinuses: Mild bilateral maxillary sinus mucosal thickening without air-fluid levels. Mastoid air cells are  well aerated. Skeleton: Broad reversed cervical lordosis with moderate to severe C5-6 and C6-7 degenerative discs. Status post median sternotomy. Tooth 8 and LEFT mandible molar periapical lucency/abscess. Upper chest: Lung apices are clear. Markedly enlarged partially imaged main pulmonary artery versus mediastinal mass. Asymmetrically smaller LEFT mainstem bronchus. Other: Tracheostomy scar. IMPRESSION: Mild proximal tracheal wall irregularity without stenosis. Layering secretions in hypopharynx presents aspiration risk. Asymmetrically smaller LEFT lower lobe mainstem bronchus. Markedly enlarged main pulmonary artery versus mediastinal mass. Recommend CT chest. Electronically Signed   By: Elon Alas M.D.   On: 10/20/2016 18:33   Ct Chest Wo Contrast  Result Date: 10/21/2016 CLINICAL DATA:  60 year old female with possible mediastinal mass identified on neck CT. EXAM: CT CHEST WITHOUT CONTRAST TECHNIQUE: Multidetector CT imaging of the chest was performed following the standard protocol without IV contrast. COMPARISON:  10/20/2016 neck CT. FINDINGS: Cardiovascular: The possible mediastinal mass identified on neck CT represents a mildly enlarged main pulmonary artery with a diameter of 3.7 cm. Cardiomegaly, mitral valve replacement and left IJ central venous catheter with tip at the superior cavoatrial junction noted. No pericardial effusion. Mediastinum/Nodes: No mass, enlarged lymph nodes identified. Lungs/Pleura: Moderate bilateral lower lobe atelectasis identified. There is no evidence of pleural effusion, pneumothorax or mass. Upper Abdomen: No acute abnormality. Musculoskeletal: Median sternotomy  changes noted. No acute bony abnormalities present. IMPRESSION: Mildly enlarged main pulmonary artery (3.7 cm) accounting for possible mediastinal mass identified on recent neck CT. Moderate bilateral lower lobe atelectasis. Cardiomegaly and mitral valve replacement. Electronically Signed   By: Margarette Canada M.D.   On: 10/21/2016 17:34   Dg Chest Port 1 View  Result Date: 11/04/2016 CLINICAL DATA:  Acute respiratory failure with hypoxia. EXAM: PORTABLE CHEST 1 VIEW COMPARISON:  11/02/2016. FINDINGS: Cardiomegaly. Prior median sternotomy for valvular disease. Dual lumen catheter from LEFT IJ approach lies with tips in the proximal RA. No infiltrates or edema. No effusion or pneumothorax. Mild subsegmental atelectasis may be present in the retrocardiac region. IMPRESSION: Cardiomegaly.  Dual lumen catheter in satisfactory position. Slight increase in retrocardiac opacity could represent subsegmental atelectasis. Electronically Signed   By: Staci Righter M.D.   On: 11/04/2016 11:11   Dg Chest Portable 1 View  Result Date: 11/02/2016 CLINICAL DATA:  Shortness of breath and tachypnea EXAM: PORTABLE CHEST 1 VIEW COMPARISON:  Yesterday FINDINGS: Dialysis catheter on the left with tip at the upper right atrium. Chronic cardiomegaly, status post mitral valve replacement. There is no edema, consolidation, effusion, or pneumothorax. IMPRESSION: No evidence of acute disease. Electronically Signed   By: Monte Fantasia M.D.   On: 11/02/2016 18:29   Dg Swallowing Func-speech Pathology  Result Date: 10/11/2016 Objective Swallowing Evaluation: Type of Study: MBS-Modified Barium Swallow Study Patient Details Name: Deborah [name] MRN: [id number] Date of Birth: 05/10/1956 Today's Date: 10/11/2016 Time: SLP Start Time (ACUTE ONLY): 1055-SLP Stop Time (ACUTE ONLY): 1109 SLP Time Calculation (min) (ACUTE ONLY): 14 min Past Medical History: Past Medical History: Diagnosis Date . Diabetes mellitus  . Hyperlipidemia  .  Hypertension  Past Surgical History: Past Surgical History: Procedure Laterality Date . AV FISTULA PLACEMENT Left 10/02/2016  Procedure: INSERTION OF ARTERIOVENOUS (AV) GORE-TEX GRAFT ARM;  Surgeon: Waynetta Sandy, MD;  Location: [place];  Service: Vascular;  Laterality: Left; . CARDIAC CATHETERIZATION N/A 08/15/2016  Procedure: Left Heart Cath and Coronary Angiography;  Surgeon: Yolonda Kida, MD;  Location: [place] [facility];  Service: Cardiovascular;  Laterality: N/A; . CARDIAC CATHETERIZATION N/A 08/15/2016  Procedure:  Coronary Stent Intervention;  Surgeon: Yolonda Kida, MD;  Location: [place] [facility];  Service: Cardiovascular;  Laterality: N/A; . CARDIAC CATHETERIZATION N/A 08/18/2016  Procedure: Right Heart Cath;  Surgeon: Jolaine Artist, MD;  Location: [place] [facility];  Service: Cardiovascular;  Laterality: N/A; . CARDIAC CATHETERIZATION N/A 08/18/2016  Procedure: IABP Insertion;  Surgeon: Jolaine Artist, MD;  Location: [place] [facility];  Service: Cardiovascular;  Laterality: N/A; . CARDIAC CATHETERIZATION Right 08/23/2016  Procedure: CENTRAL LINE INSERTION RIGHT SUBCLAVIAN;  Surgeon: Ivin Poot, MD;  Location: [place];  Service: Open Heart Surgery;  Laterality: Right; . ENDOVEIN HARVEST OF GREATER SAPHENOUS VEIN Left 08/23/2016  Procedure: ENDOVEIN HARVEST OF GREATER SAPHENOUS VEIN;  Surgeon: Ivin Poot, MD;  Location: [place];  Service: Open Heart Surgery;  Laterality: Left; . INSERTION OF DIALYSIS CATHETER Bilateral 08/31/2016  Procedure: INSERTION OF DIALYSIS CATHETER LEFT INTERNAL JUGULAR VEIN & INSERTION OF TRIPLE LUMEN RIGHT INTERNAL JUGULAR VEIN;  Surgeon: Angelia Mould, MD;  Location: [place];  Service: Vascular;  Laterality: Bilateral; . INTRAOPERATIVE TRANSESOPHAGEAL ECHOCARDIOGRAM N/A 08/23/2016  Procedure: INTRAOPERATIVE TRANSESOPHAGEAL ECHOCARDIOGRAM;  Surgeon: Ivin Poot, MD;  Location: [place];  Service: Open Heart Surgery;  Laterality: N/A; .  MITRAL VALVE REPAIR N/A 08/23/2016  Procedure: MITRAL VALVE REPAIR (MVR) USING 25MM EDWARDS MAGNA EASE BIOPROSTHESIS MITRAL  VALVE;  Surgeon: Ivin Poot, MD;  Location: [place];  Service: Open Heart Surgery;  Laterality: N/A; . VAGINAL DELIVERY    x 6 HPI: 60 yo female admitted to [facility] 08/15/16 with STEMI s/p cath to [place]. Developed cardiogenic shock, VDRF (ETT 9/14-9/29), AKI with metabolic acidosis and started on CRRT 08/17/16. Transferred to [facility] to tx shock and evaluated new severe MR from papillary muscle rupture. She also had fever and found to have Group B Strep in sputum culture.Mitral valve replacement 9/20. Swallow evaluation 9/30 with recs for NPO given poor mentation, severely impaired swallow function s/p prolonged intubation. Reintubated 9/30 secondary to septic shock; trach 10/5; new onset right hemiplegia and right gaze preference.  CT negative for acute changes; dx acute encephalopathy. MBS on 10/23 and Dys 2 with nectar thick liquids using a chin tuck began. 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 She had a baseline, congested cough throughout MBS that was not associated with aspiration. Mild piecemeal swallowing observed with pureed and regular solids. A delay in swallow initation to the valleculae with reduced laryngeal closure seen for all PO intake. Given thin liquid, pt had a single event of silent aspiration, but when utilizing a chin tuck intermittent flash penetration occurred with no penetrate/aspirate residuals. Following multiple thin liquid trials, pt's strong cough resulted in retrograde backflow of boluses from the esophagus to the level of the  valleculae. Retrograde backflow intermittently occurred into the pharynx with more solid trials; however, her airway remained protected throughout these events. Esophageal sweep showed mild build up of consistencies (MD not present to confirm). Recommend advancement to Dys 3 and thin liquids utilizing a chin tuck for every sip. MD may wish to consider further esophageal assessment given findings on most recent two MBS, which impacts pt's risk for post-prandial aspiration and may be influencing some of her coughing. Will continue to follow acutely for diet tolerance. Impact on safety and function  Mild aspiration risk   CHL IP TREATMENT RECOMMENDATION 10/11/2016 Treatment Recommendations Therapy as outlined in treatment plan below   Prognosis 10/11/2016 Prognosis for Safe Diet Advancement Good Barriers to Reach Goals -- Barriers/Prognosis Comment -- CHL IP DIET RECOMMENDATION 10/11/2016 SLP Diet Recommendations Dysphagia 3 (Mech soft) solids;Thin liquid Liquid Administration via Cup;No straw Medication Administration Crushed with puree Compensations Minimize environmental distractions;Slow rate;Small sips/bites;Chin tuck;Follow solids with liquid Postural Changes Remain semi-upright after after feeds/meals (Comment);Seated upright at 90 degrees   CHL IP OTHER RECOMMENDATIONS 10/11/2016 Recommended Consults -- Oral Care Recommendations Oral care BID Other Recommendations --   CHL IP FOLLOW UP RECOMMENDATIONS 10/11/2016 Follow up Recommendations Inpatient Rehab   CHL IP FREQUENCY AND DURATION 10/11/2016 Speech Therapy Frequency (ACUTE ONLY) min 2x/week Treatment Duration 2 weeks      CHL IP ORAL PHASE 10/11/2016 Oral Phase Impaired Oral - Pudding Teaspoon -- Oral - Pudding Cup -- Oral - Honey Teaspoon -- Oral - Honey Cup -- Oral - Nectar Teaspoon -- Oral - Nectar Cup NT Oral - Nectar Straw NT Oral - Thin Teaspoon NT Oral - Thin Cup WFL Oral - Thin Straw -- Oral - Puree Piecemeal swallowing Oral - Mech Soft NT Oral - Regular  Piecemeal swallowing Oral - Multi-Consistency NT Oral - Pill NT Oral Phase - Comment --  CHL IP PHARYNGEAL PHASE 10/11/2016 Pharyngeal Phase Impaired Pharyngeal- Pudding Teaspoon -- Pharyngeal -- Pharyngeal- Pudding Cup -- Pharyngeal -- Pharyngeal- Honey Teaspoon -- Pharyngeal -- Pharyngeal- Honey Cup -- Pharyngeal -- Pharyngeal- Nectar Teaspoon -- Pharyngeal -- Pharyngeal- Nectar Cup NT Pharyngeal -- Pharyngeal- Nectar Straw NT Pharyngeal -- Pharyngeal- Thin Teaspoon NT Pharyngeal -- Pharyngeal- Thin Cup Penetration/Aspiration before swallow;Reduced airway/laryngeal closure;Delayed swallow initiation-vallecula;Compensatory strategies attempted (with notebox) Pharyngeal Material enters airway, passes BELOW cords without attempt by patient to eject out (silent aspiration);Material enters airway, remains ABOVE vocal cords then ejected out Pharyngeal- Thin Straw -- Pharyngeal -- Pharyngeal- Puree Delayed swallow initiation-vallecula;Reduced airway/laryngeal closure Pharyngeal -- Pharyngeal- Mechanical Soft -- Pharyngeal -- Pharyngeal- Regular Delayed swallow initiation-vallecula;Reduced airway/laryngeal closure Pharyngeal -- Pharyngeal- Multi-consistency -- Pharyngeal -- Pharyngeal- Pill NT Pharyngeal -- Pharyngeal Comment --  CHL IP CERVICAL ESOPHAGEAL PHASE 10/11/2016 Cervical Esophageal Phase Impaired Pudding Teaspoon -- Pudding Cup -- Honey Teaspoon -- Honey Cup -- Nectar Teaspoon -- Nectar Cup -- Nectar Straw -- Thin Teaspoon -- Thin Cup Esophageal backflow into the pharynx Thin Straw -- Puree Esophageal backflow into the pharynx Mechanical Soft -- Regular Esophageal backflow into the pharynx Multi-consistency -- Pill -- Cervical Esophageal Comment -- No flowsheet data found. Germain Osgood 10/11/2016, 1:23 PM  Note populated for Jiles Prows, student SLP Germain Osgood, M.A. CCC-SLP [phone]               LOS: 5 days   Oren Binet, MD  [facility] Pager:[phone]  If 7PM-7AM, please  contact night-coverage [email or web address] Password [email or web address] 11/07/2016, 11:52 AM

## 2016-11-07 NOTE — Progress Notes (Signed)
ANTICOAGULATION CONSULT NOTE - f/u Consult  Pharmacy Consult for Heparin Indication: atrial fibrillation  No Known Allergies  Patient Measurements: Height: 5\' 2"  (157.5 cm) Weight: 115 lb 11.9 oz (52.5 kg) IBW/kg (Calculated) : 50.1 Heparin Dosing Weight:  55kg  Vital Signs: Temp: 97.9 F (36.6 C) (12/05 2005) Temp Source: Oral (12/05 2005) BP: 104/89 (12/05 1545) Pulse Rate: 118 (12/05 2002)  Labs:  Recent Labs  11/06/16 0348 11/07/16 0332 11/07/16 1110 11/07/16 1902  HGB 11.4* 12.0  --  13.7  HCT 36.2 37.5  --  42.0  PLT 561* 509*  --  462*  LABPROT 33.3* 28.6*  --  21.2*  INR 3.18 2.63  --  1.80  CREATININE  --   --  7.04*  --     Estimated Creatinine Clearance: 6.8 mL/min (by C-G formula based on SCr of 7.04 mg/dL (H)).   Medical History: Past Medical History:  Diagnosis Date  . Diabetes mellitus   . Hyperlipidemia   . Hypertension   . MI, old   . Renal disorder    dialysls    Assessment: 60 yo F presents on 11/30 with SOB. On Coumadin PTA for Afib, also has a recent MVR.   Anticoag: On Coumadin for Afib. 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 Recent prolonged hospitalization (d/c 11/29 from rehab). Hgb 13.7 improved with stat labs tonight. INR trended down quickly today to 1.8 tonight. Will go ahead and start IV heparin.   Goal of Therapy:  Heparin level 0.3-0.7 units/ml Monitor platelets by anticoagulation protocol: Yes   Plan:  Start heparin at 800 units/hr Holding Coumadin for trach Daily INR  Erin Hearing PharmD., BCPS Clinical Pharmacist Pager [phone] 11/07/2016 8:33 PM

## 2016-11-07 NOTE — Progress Notes (Signed)
Dr. Sloan Leiter called to start pt on Heparin for INR <2.0. Heparin notified. Lab called regarding critical troponin 0.07. Notified NP on called via [place]. Pt is alert and oriented. Stated she just feel anxious. Mentioned her operation on Thursday. Stated she is ready for the trach because she is tired of coughing. It seems like its never ending. MD is aware. Will continue to monitor.

## 2016-11-07 NOTE — Progress Notes (Signed)
Patient arrived to unit per bed.  Reviewed treatment plan and this RN agrees.  Report received from bedside RN, Judson Roch.  Consent verified.  Patient A & O X 4. Lung sounds coarse to ausculation in all fields. Genearlized edema. Cardiac: NSR.  Prepped LUAVG with alcohol and cannulated with two 17 gauge needles.  Pulsation of blood noted.  Flushed access well with saline per protocol.  Connected and secured lines and initiated tx at 1057.  UF goal of 3000 mL and net fluid removal of 2500 mL.  Will continue to monitor.

## 2016-11-07 NOTE — Progress Notes (Signed)
NIF-  -5 VC- 0.5L Pt showing poor effort and/or understanding.  RT worked with pt but she would blow into the NIF and would never give a good deep breath on VC.  RT will monitor.

## 2016-11-08 ENCOUNTER — Inpatient Hospital Stay (HOSPITAL_COMMUNITY): Payer: Medicaid Other

## 2016-11-08 ENCOUNTER — Ambulatory Visit: Payer: Self-pay | Admitting: Cardiothoracic Surgery

## 2016-11-08 ENCOUNTER — Encounter: Payer: Self-pay | Admitting: Physical Medicine & Rehabilitation

## 2016-11-08 DIAGNOSIS — F411 Generalized anxiety disorder: Secondary | ICD-10-CM

## 2016-11-08 LAB — TROPONIN I: TROPONIN I: 0.09 ng/mL — AB (ref <0.03)

## 2016-11-08 LAB — BLOOD CULTURE ID PANEL (REFLEXED)
Acinetobacter baumannii: NOT DETECTED
CANDIDA ALBICANS: NOT DETECTED
CANDIDA GLABRATA: NOT DETECTED
Candida krusei: NOT DETECTED
Candida parapsilosis: NOT DETECTED
Candida tropicalis: NOT DETECTED
ENTEROBACTER CLOACAE COMPLEX: NOT DETECTED
ENTEROBACTERIACEAE SPECIES: NOT DETECTED
ENTEROCOCCUS SPECIES: NOT DETECTED
Escherichia coli: NOT DETECTED
HAEMOPHILUS INFLUENZAE: NOT DETECTED
Klebsiella oxytoca: NOT DETECTED
Klebsiella pneumoniae: NOT DETECTED
LISTERIA MONOCYTOGENES: NOT DETECTED
METHICILLIN RESISTANCE: NOT DETECTED
NEISSERIA MENINGITIDIS: NOT DETECTED
PROTEUS SPECIES: NOT DETECTED
Pseudomonas aeruginosa: NOT DETECTED
STAPHYLOCOCCUS SPECIES: DETECTED — AB
STREPTOCOCCUS AGALACTIAE: NOT DETECTED
STREPTOCOCCUS SPECIES: NOT DETECTED
Serratia marcescens: NOT DETECTED
Staphylococcus aureus (BCID): DETECTED — AB
Streptococcus pneumoniae: NOT DETECTED
Streptococcus pyogenes: NOT DETECTED

## 2016-11-08 LAB — CBC
HEMATOCRIT: 38.4 % (ref 36.0–46.0)
Hemoglobin: 12.3 g/dL (ref 12.0–15.0)
MCH: 28 pg (ref 26.0–34.0)
MCHC: 32 g/dL (ref 30.0–36.0)
MCV: 87.5 fL (ref 78.0–100.0)
Platelets: 369 10*3/uL (ref 150–400)
RBC: 4.39 MIL/uL (ref 3.87–5.11)
RDW: 19.8 % — AB (ref 11.5–15.5)
WBC: 30.5 10*3/uL — AB (ref 4.0–10.5)

## 2016-11-08 LAB — GLUCOSE, CAPILLARY
GLUCOSE-CAPILLARY: 152 mg/dL — AB (ref 65–99)
GLUCOSE-CAPILLARY: 203 mg/dL — AB (ref 65–99)
GLUCOSE-CAPILLARY: 109 mg/dL — AB (ref 65–99)
Glucose-Capillary: 170 mg/dL — ABNORMAL HIGH (ref 65–99)

## 2016-11-08 LAB — HEPARIN LEVEL (UNFRACTIONATED)
HEPARIN UNFRACTIONATED: 1.08 [IU]/mL — AB (ref 0.30–0.70)
HEPARIN UNFRACTIONATED: 0.57 [IU]/mL (ref 0.30–0.70)
Heparin Unfractionated: 0.28 IU/mL — ABNORMAL LOW (ref 0.30–0.70)

## 2016-11-08 LAB — PROTIME-INR
INR: 1.7
Prothrombin Time: 20.1 seconds — ABNORMAL HIGH (ref 11.4–15.2)

## 2016-11-08 MED ORDER — DEXTROSE 5 % IV SOLN
1.0000 g | INTRAVENOUS | Status: DC
  Administered 2016-11-08 – 2016-11-11 (×3): 1 g via INTRAVENOUS
  Filled 2016-11-08 (×5): qty 1

## 2016-11-08 MED ORDER — LORAZEPAM 2 MG/ML IJ SOLN
1.0000 mg | Freq: Four times a day (QID) | INTRAMUSCULAR | Status: DC | PRN
  Administered 2016-11-09: 1 mg via INTRAVENOUS
  Filled 2016-11-08: qty 1

## 2016-11-08 MED ORDER — INSULIN DETEMIR 100 UNIT/ML ~~LOC~~ SOLN
12.0000 [IU] | Freq: Every day | SUBCUTANEOUS | Status: DC
  Administered 2016-11-08 – 2016-11-09 (×2): 12 [IU] via SUBCUTANEOUS
  Filled 2016-11-08 (×2): qty 0.12

## 2016-11-08 MED ORDER — CLONAZEPAM 0.5 MG PO TABS
0.5000 mg | ORAL_TABLET | Freq: Two times a day (BID) | ORAL | Status: DC
  Administered 2016-11-08 – 2016-11-10 (×5): 0.5 mg via ORAL
  Filled 2016-11-08 (×5): qty 1

## 2016-11-08 MED ORDER — INSULIN DETEMIR 100 UNIT/ML ~~LOC~~ SOLN
8.0000 [IU] | Freq: Every day | SUBCUTANEOUS | Status: DC
  Administered 2016-11-08: 8 [IU] via SUBCUTANEOUS
  Filled 2016-11-08 (×2): qty 0.08

## 2016-11-08 MED ORDER — VANCOMYCIN HCL 10 G IV SOLR
1250.0000 mg | Freq: Once | INTRAVENOUS | Status: AC
  Administered 2016-11-08: 1250 mg via INTRAVENOUS
  Filled 2016-11-08: qty 1250

## 2016-11-08 MED ORDER — VANCOMYCIN HCL 500 MG IV SOLR
500.0000 mg | INTRAVENOUS | Status: DC
  Administered 2016-11-11: 500 mg via INTRAVENOUS
  Filled 2016-11-08 (×5): qty 500

## 2016-11-08 NOTE — Care Management Note (Signed)
Case Management Note  Patient Details  Name: THEOLA CUELLAR MRN: [id number] Date of Birth: 03-Oct-1956  Subjective/Objective:  12/1- presents with recent prolonged hospitalization secondary to papillary muscle rupture and subsequent HCAP. She was intubated requiring trach. Decannulated in the end of October. She then went to [facility] and was discharged 11/29. No presenting to [facility] ED 11/30 with compalints of SOB x1-2 weeks    12/ 6 1645 NCM spoke with son [name] [phone] he was in the room, he states when patient came home from CIR in a w/chair she still could not walk and she will most likely need to go to SNF to get rehab after dc.  Patient has a rolling walker at home and a w/chair and a bsc.  She is for tracheostomy 12/7. Will need pt eval after tracheostomy.  NCM will cont to follow for dc needs.                        Action/Plan:   Expected Discharge Date:                  Expected Discharge Plan:  Skilled Nursing Facility  In-House Referral:  Clinical Social Work  Discharge planning Services  CM Consult  Post Acute Care Choice:    Choice offered to:     DME Arranged:    DME Agency:     HH Arranged:    [place] Agency:     Status of Service:  In process, will continue to follow  If discussed at Long Length of Stay Meetings, dates discussed:    Additional Comments:  Zenon Mayo, RN 11/08/2016, 5:00 PM

## 2016-11-08 NOTE — Progress Notes (Signed)
PROGRESS NOTE        PATIENT DETAILS Name: Deborah [name] Age: 60 y.o. Sex: female Date of Birth: Jan 29, 1956 Admit Date: 11/02/2016 Admitting Physician Javier Glazier, MD RUE:[id number] AZBELL, MD  Brief Narrative: Patient is a 60 y.o. female with recent prolonged hospitalization (from 9/15-11/8) for STEMI requiring PCI (at [facility]), complicated by cardiac [facility] shock and acute hypoxemic respiratory failure requiring ventilator support due to papillary muscle rupture causing severe MR. She was transferred to [facility], and underwent mitral valve replacement on 9/20. Postoperatively, she developed septic shock secondary to healthcare associated pneumonia, atrial flutter requiring cardioversion, and unfortunately developed renal failure and is now ESRD and dependent on dialysis. She subsequently required a tracheostomy. She was transferred to  [facility] on 11/8. She was subsequently stabilized and discharged home on 11/29, she is now status post decannulation of her recent tracheostomy. 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 ENT on board and in discussion with family, tentatively tracheostomy scheduled for 12/7. Hospital course has been complicated by anxiety spells/panic attacks, and possible healthcare associated pneumonia.  SIGNIFICANT EVENTS  09/12 Admit to [facility] 09/15 To [facility] 09/20 MVR with bioprosthetic valve 09/28 Permcath for HD 09/29 Extubated 09/30 Reintubated 10/05 Trach by Hyman Bible 10/09 Off vent 10/25 decannulated 11/8 to CIR 11/29 DC CIR 11/30 admit for resp distress 12/5 persistent tachycardia, chest x-ray suggests pneumonia.  Subjective: Continues to cough- I do not hear any wheezing this morning. she has had d/w family-and is now willing  to proceed with tracheostomy  Assessment/Plan: Dyspnea:felt to be secondary to vocal cord dysfunction. Recent CT neck on 11/17 did not show any tracheal stenosi.ENT following, after much discussion with family she is now willing to proceed with a tracheostomy, per ENT-scheduled tentatively for 12/7. Will taper down steroids continue nebs and other supportive measures.   Leukocytosis: Initially thought to be a candidate with steroids, however due to persistent sinus tachycardia on 12/5-repeat chest x-ray confirms retrocardiac infiltrate-have started empiric vancomycin and cefepime. Follow CBC, await blood cultures.  Possible healthcare associated pneumonia: See above  Paroxysmal atrial fibrillation: Appears to be sinus rhythm, continue amiodarone for rate control-INR now less than 2, and on IV heparin in anticipation of tracheostomy on 12/7. She will need to be restarted on Coumadin after tracheostomy.   Recent Inferior posterior STEMI on 9/13 with DES x 2 to OM-1: Continue Plavix and statin-denies chest pain. Shortness of breath likely secondary to vocal cord dysfunction.  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  Chronic hypotension: On Midodrine  Insulin dependent type 2 diabetes: CBGs initially elevated due to steroids, steroids now being tapered down-CBGs relatively stable-have decreased Levemir to 8 units in a.m. and 12 units in p.m. Suspect once steroids are discontinued, she could go back on her usual home regimen.Follow CBGs and adjust accordingly.  Anemia: Probably secondary to chronic disease/ESRD, defer to nephrology  Anxiety: Hospital course complicated by episodes of panic attacks/anxiety-have started scheduled Klonopin, continue with  as needed Xanax  and Ativan for now.  DVT Prophylaxis: On IV Heparin  Code Status: Full code   Family Communication: Daughter at bedside-she does not   Disposition Plan: Remain inpatient-remain in SDU-suspect will require several more days of hospitalization  Antimicrobial agents: None  Procedures: None  CONSULTS:  pulmonary/intensive care, nephrology and ENT  Time spent: 25 minutes-Greater than 50% of this time was spent in counseling, explanation of diagnosis, planning of further management, and coordination of care.  MEDICATIONS: Anti-infectives    Start     Dose/Rate Route Frequency Ordered Stop   11/09/16 1200  vancomycin (VANCOCIN) 500 mg in sodium chloride 0.9 % 100 mL IVPB     500 mg 100 mL/hr over 60 Minutes Intravenous Every T-Th-Sa (Hemodialysis) 11/08/16 0919     11/08/16 1000  ceFEPIme (MAXIPIME) 1 g in dextrose 5 % 50 mL IVPB     1 g 100 mL/hr over 30 Minutes Intravenous Every 24 hours 11/08/16 0919     11/08/16 0930  vancomycin (VANCOCIN) 1,250 mg in sodium chloride 0.9 % 250 mL IVPB     1,250 mg 166.7 mL/hr over 90 Minutes Intravenous  Once 11/08/16 0919        Scheduled Meds: . amiodarone  200 mg Oral Daily  . atorvastatin  20 mg Oral q1800  . ceFEPime (MAXIPIME) IV  1 g Intravenous Q24H  . clonazePAM  0.5 mg Oral BID  . clopidogrel  75 mg Oral Daily  . darbepoetin (ARANESP) injection - DIALYSIS  60 mcg Intravenous Q Sat-HD  . feeding supplement (NEPRO CARB STEADY)  237 mL Oral BID BM  . ferric gluconate (FERRLECIT/NULECIT) IV  125 mg Intravenous Q T,Th,Sa-HD  . fluticasone  2 spray Each Nare Daily  . insulin aspart  0-15 Units Subcutaneous TID WC  . insulin aspart  0-5 Units Subcutaneous QHS  . insulin detemir  12 Units Subcutaneous Q2200  . insulin detemir  8 Units Subcutaneous Daily  . lanthanum  1,000 mg Oral TID WC  . loratadine  10 mg Oral Daily  . metoCLOPramide  5 mg Oral BID WC  . midodrine  10 mg Oral q morning - 10a  . multivitamin   1 tablet Oral QHS  . pantoprazole  40 mg Oral BID  . sodium chloride flush  3 mL Intravenous Q12H  . vancomycin  1,250 mg Intravenous Once  . [START ON 11/09/2016] vancomycin  500 mg Intravenous Q T,Th,Sa-HD  . Warfarin - Pharmacist Dosing Inpatient   Does not apply q1800   Continuous Infusions: . heparin 900 Units/hr (11/08/16 0850)   PRN Meds:.levalbuterol, LORazepam, menthol-cetylpyridinium, morphine injection   PHYSICAL EXAM: Vital signs: Vitals:   11/07/16 2005 11/07/16 2334 11/08/16 0400 11/08/16 0839  BP:  (!) 90/54 132/86 124/70  Pulse:  (!) 108 (!) 109 98  Resp:  (!) 26 (!) 29 (!) 29  Temp: 97.9 F (36.6 C)  97.7 F (36.5 C) 97.8 F (36.6 C)  TempSrc: Oral  Axillary Oral  SpO2:  94% 98% 100%  Weight:      Height:       Filed Weights   11/07/16 0500 11/07/16 1042 11/07/16 1457  Weight: 54.5 kg (120 lb 2.4 oz) 54.5 kg (120 lb 2.4 oz) 52.5 kg (115 lb 11.9 oz)   Body mass index is 21.17 kg/m.   General appearance :Awake, alert, not in any distress.  Eyes:, pupils equally reactive to light and accomodation,no scleral icterus. HEENT: Atraumatic and Normocephalic Neck: supple, no JVD. No cervical lymphadenopathy.  Resp:Good air entry bilaterally, no added sounds  CVS: S1 S2 regular GI: Bowel sounds present, Non tender and not distended with no gaurding, rigidity or rebound.No organomegaly Extremities: B/L Lower Ext shows no edema, both legs are warm to touch Neurology:  speech clear,Non focal, sensation is grossly intact. Psychiatric: Normal judgment and insight. Alert and oriented x 3.  Musculoskeletal:No digital cyanosis Skin:No Rash, warm and dry Wounds:N/A  I have personally reviewed following labs and imaging studies  LABORATORY DATA: CBC:  Recent Labs Lab 11/02/16 1810  11/05/16 0402 11/06/16 0348 11/07/16 0332 11/07/16 1902 11/08/16 0433  WBC 13.9*  < > 18.9* 19.0* 31.9* 27.1* 30.5*  NEUTROABS 10.5*  --   --   --   --   --   --   HGB 11.3*  <  > 10.7* 11.4* 12.0 13.7 12.3  HCT 36.4  < > 33.8* 36.2 37.5 42.0 38.4  MCV 87.7  < > 87.1 87.4 86.8 86.8 87.5  PLT 513*  < > 532* 561* 509* 462* 369  < > = values in this interval not displayed.  Basic Metabolic Panel:  Recent Labs Lab 11/02/16 1810 11/02/16 1823 11/03/16 0503 11/04/16 0358 11/07/16 1110 11/07/16 1902  NA 133*  --  137 133* 128* 134*  K 3.7  --  3.7 3.4* 3.9 3.2*  CL 95*  --  95* 94* 91* 93*  CO2 20*  --  17* 26 22 25   GLUCOSE 104*  --  181* 151* 388* 147*  BUN 7  --  13 29* 70* 20  CREATININE 1.99*  --  3.01* 4.70* 7.04* 3.16*  CALCIUM 8.9  --  9.3 9.1 9.0 9.2  MG  --  1.7  --   --   --   --   PHOS  --   --  4.6  --  3.7  --     GFR: Estimated Creatinine Clearance: 15.2 mL/min (by C-G formula based on SCr of 3.16 mg/dL (H)).  Liver Function Tests:  Recent Labs Lab 11/02/16 1810 11/03/16 0503 11/07/16 1110 11/07/16 1902  AST 28  --   --  22  ALT 19  --   --  21  ALKPHOS 68  --   --  69  BILITOT 1.0  --   --  0.9  PROT 7.0  --   --  7.5  ALBUMIN 2.8* 3.1* 2.5* 3.1*   No results for input(s): LIPASE, AMYLASE in the last 168 hours. No results for input(s): AMMONIA in the last 168 hours.  Coagulation Profile:  Recent Labs Lab 11/05/16 0402 11/06/16 0348 11/07/16 0332 11/07/16 1902 11/08/16 0433  INR 3.69 3.18 2.63 1.80 1.70    Cardiac Enzymes:  Recent Labs Lab 11/07/16 1902 11/08/16 0433  TROPONINI 0.07* 0.09*    BNP (last 3 results) No results for input(s): PROBNP in the last 8760 hours.  HbA1C: No results for input(s): HGBA1C in the last 72 hours.  CBG:  Recent Labs Lab 11/07/16 1311 11/07/16 1628 11/07/16 1854 11/07/16 2113 11/08/16 0836  GLUCAP 153* 125* 158* 126* 152*    Lipid Profile: No results for input(s): CHOL, HDL, LDLCALC, TRIG, CHOLHDL, LDLDIRECT in the last 72 hours.  Thyroid Function Tests: No results for input(s): TSH, T4TOTAL, FREET4, T3FREE, THYROIDAB in the last 72 hours.  Anemia Panel: No  results for input(s): VITAMINB12, FOLATE, FERRITIN, TIBC, IRON, RETICCTPCT in the last 72 hours.  Urine analysis:    Component Value Date/Time   COLORURINE YELLOW 10/30/2016  2120   APPEARANCEUR CLOUDY (A) 10/30/2016 2120   LABSPEC 1.021 10/30/2016 2120   PHURINE 5.0 10/30/2016 2120   GLUCOSEU NEGATIVE 10/30/2016 2120   HGBUR NEGATIVE 10/30/2016 2120   BILIRUBINUR SMALL (A) 10/30/2016 2120   [place] NEGATIVE 10/30/2016 2120   PROTEINUR 30 (A) 10/30/2016 2120   NITRITE NEGATIVE 10/30/2016 2120   LEUKOCYTESUR SMALL (A) 10/30/2016 2120    Sepsis Labs: Lactic Acid, Venous    Component Value Date/Time   LATICACIDVEN 0.89 09/10/2016 0736    MICROBIOLOGY: Recent Results (from the past 240 hour(s))  Urine culture     Status: Abnormal   Collection Time: 10/30/16  9:19 PM  Result Value Ref Range Status   Specimen Description URINE, CLEAN CATCH  Final   Special Requests NONE  Final   Culture MULTIPLE SPECIES PRESENT, SUGGEST RECOLLECTION (A)  Final   Report Status 11/01/2016 FINAL  Final  MRSA PCR Screening     Status: Abnormal   Collection Time: 11/03/16  5:42 AM  Result Value Ref Range Status   MRSA by PCR POSITIVE (A) NEGATIVE Final    Comment:        The GeneXpert MRSA Assay (FDA approved for NASAL specimens only), is one component of a comprehensive MRSA colonization surveillance program. It is not intended to diagnose MRSA infection nor to guide or monitor treatment for MRSA infections. RESULT CALLED TO, READ BACK BY AND VERIFIED WITH: M.SCHMIT RN AT [phone] 11/03/16 BY A.DAVIS     RADIOLOGY STUDIES/RESULTS: Dg Chest 2 View  Result Date: 11/08/2016 CLINICAL DATA:  Short of breath, hypertension, diabetes EXAM: CHEST  2 VIEW COMPARISON:  Portable chest x-ray of 11/07/2016 FINDINGS: Opacity remains at the left arm right lung base with apparent air bronchograms most consistent with left lower lobe pneumonia. Mild atelectasis at the right lung base remains. No definite  pleural effusion is seen. Cardiomegaly is stable. Left central venous line is unchanged in position. Mitral valve replacement is present. IMPRESSION: 1. Persistent left lower lobe opacity most suspicious for pneumonia. 2. Stable cardiomegaly with central venous line. 3. Right basilar linear atelectasis. Electronically Signed   By: Ivar Drape M.D.   On: 11/08/2016 08:39   Dg Chest 2 View  Result Date: 11/01/2016 CLINICAL DATA:  Wheezing, shortness of breath, cough. EXAM: CHEST  2 VIEW COMPARISON:  Radiographs of October 30, 2016. FINDINGS: Stable cardiomegaly. Status post cardiac valve repair. Left internal jugular dialysis catheter is unchanged in position. No pneumothorax or pleural effusion is noted. No acute pulmonary disease is noted. Bony thorax is unremarkable. IMPRESSION: No active cardiopulmonary disease. Electronically Signed   By: Marijo Conception, M.D.   On: 11/01/2016 07:25   Dg Chest 2 View  Result Date: 10/30/2016 CLINICAL DATA:  Cough and leukocytosis EXAM: CHEST  2 VIEW COMPARISON:  Chest radiograph October 15, 2016 and chest CT October 21, 2016 FINDINGS: There is persistent consolidation in the medial bases bilaterally, slightly more although left than on the right. No new parenchymal lung opacity evident. Heart is borderline prominent pulmonary vascularity within normal limits. No adenopathy. Port-A-Cath tip is at the cavoatrial junction. No pneumothorax. There is a mitral valve replacement. No bone lesions. IMPRESSION: Bibasilar consolidation. No new opacity. Stable cardiac silhouette. Electronically Signed   By: Lowella Grip III M.D.   On: 10/30/2016 10:46   Dg Chest 2 View  Result Date: 10/15/2016 CLINICAL DATA:  Cough.  Chest pain EXAM: CHEST  2 VIEW COMPARISON:  10/11/2016 FINDINGS: There is a left chest  wall dialysis catheter with tips in the cavoatrial junction. Moderate cardiac enlargement. Previous median sternotomy and mitral bowel replacement. No pleural effusion  or edema. No airspace opacities. IMPRESSION: 1. No acute cardiopulmonary abnormalities. Electronically Signed   By: Kerby Moors M.D.   On: 10/15/2016 09:13   Dg Chest 2 View  Result Date: 10/11/2016 CLINICAL DATA:  Dyspnea and cough for 1 month. EXAM: CHEST  2 VIEW COMPARISON:  10/08/2016, 08/30/2016 FINDINGS: There is stable cardiomegaly. There is subpleural scarring in the left mid lung and left lung base. There may be a small right lower lobe infiltrate. No pneumothorax, effusion or CHF. Status post median sternotomy and mitral valvular repair. Dual-lumen left jugular central line catheter extends into the right atrium, unchanged in appearance. IMPRESSION: Stable cardiomegaly. Faint opacities in the right lower lobe medially which may reflect a small pneumonic consolidation. Atelectasis and/or scarring at the left lung base. Electronically Signed   By: Ashley Royalty M.D.   On: 10/11/2016 18:24   Ct Soft Tissue Neck Wo Contrast  Result Date: 10/20/2016 CLINICAL DATA:  None. FINDINGS: Pharynx and larynx: Layering secretions in the hypopharynx extending to the bilateral piriform sinus. Normal larynx. Mild proximal tracheal wall irregularity without stenosis. Salivary glands: Normal. Thyroid: Normal. Lymph nodes: No lymphadenopathy by CT size criteria though sensitivity decreased without contrast. Vascular: Retropharyngeal course of the carotid arteries. Tunneled dialysis catheter via LEFT internal jugular venous approach. Limited intracranial: Nonacute. Mild calcific atherosclerosis of the carotid siphons. Visualized orbits: Normal. Mastoids and visualized paranasal sinuses: Mild bilateral maxillary sinus mucosal thickening without air-fluid levels. Mastoid air cells are well aerated. Skeleton: Broad reversed cervical lordosis with moderate to severe C5-6 and C6-7 degenerative discs. Status post median sternotomy. Tooth 8 and LEFT mandible molar periapical lucency/abscess. Upper chest: Lung apices are  clear. Markedly enlarged partially imaged main pulmonary artery versus mediastinal mass. Asymmetrically smaller LEFT mainstem bronchus. Other: Tracheostomy scar. IMPRESSION: Mild proximal tracheal wall irregularity without stenosis. Layering secretions in hypopharynx presents aspiration risk. Asymmetrically smaller LEFT lower lobe mainstem bronchus. Markedly enlarged main pulmonary artery versus mediastinal mass. Recommend CT chest. Electronically Signed   By: Elon Alas M.D.   On: 10/20/2016 18:33   Ct Chest Wo Contrast  Result Date: 10/21/2016 CLINICAL DATA:  60 year old female with possible mediastinal mass identified on neck CT. EXAM: CT CHEST WITHOUT CONTRAST TECHNIQUE: Multidetector CT imaging of the chest was performed following the standard protocol without IV contrast. COMPARISON:  10/20/2016 neck CT. FINDINGS: Cardiovascular: The possible mediastinal mass identified on neck CT represents a mildly enlarged main pulmonary artery with a diameter of 3.7 cm. Cardiomegaly, mitral valve replacement and left IJ central venous catheter with tip at the superior cavoatrial junction noted. No pericardial effusion. Mediastinum/Nodes: No mass, enlarged lymph nodes identified. Lungs/Pleura: Moderate bilateral lower lobe atelectasis identified. There is no evidence of pleural effusion, pneumothorax or mass. Upper Abdomen: No acute abnormality. Musculoskeletal: Median sternotomy changes noted. No acute bony abnormalities present. IMPRESSION: Mildly enlarged main pulmonary artery (3.7 cm) accounting for possible mediastinal mass identified on recent neck CT. Moderate bilateral lower lobe atelectasis. Cardiomegaly and mitral valve replacement. Electronically Signed   By: Margarette Canada M.D.   On: 10/21/2016 17:34   Dg Chest Port 1 View  Result Date: 11/07/2016 CLINICAL DATA:  Dyspnea, hypertension, myocardial infarction and diabetes. EXAM: PORTABLE CHEST 1 VIEW COMPARISON:  11/04/2016 FINDINGS: There is stable  mild cardiomegaly. The patient is status post mitral valvular replacement. Median sternotomy sutures are noted. Dialysis catheter from  left IJ approach is noted with tip at the cavoatrial junction. No pneumothorax. Retrocardiac opacities are seen suspicious for left lower lobe pneumonia/atelectasis and probable small layering effusion. IMPRESSION: Retrocardiac opacity suspicious for pneumonia and/or atelectasis. Small layering left effusion is not entirely excluded. Otherwise no significant change. Electronically Signed   By: Ashley Royalty M.D.   On: 11/07/2016 20:54   Dg Chest Port 1 View  Result Date: 11/04/2016 CLINICAL DATA:  Acute respiratory failure with hypoxia. EXAM: PORTABLE CHEST 1 VIEW COMPARISON:  11/02/2016. FINDINGS: Cardiomegaly. Prior median sternotomy for valvular disease. Dual lumen catheter from LEFT IJ approach lies with tips in the proximal RA. No infiltrates or edema. No effusion or pneumothorax. Mild subsegmental atelectasis may be present in the retrocardiac region. IMPRESSION: Cardiomegaly.  Dual lumen catheter in satisfactory position. Slight increase in retrocardiac opacity could represent subsegmental atelectasis. Electronically Signed   By: Staci Righter M.D.   On: 11/04/2016 11:11   Dg Chest Portable 1 View  Result Date: 11/02/2016 CLINICAL DATA:  Shortness of breath and tachypnea EXAM: PORTABLE CHEST 1 VIEW COMPARISON:  Yesterday FINDINGS: Dialysis catheter on the left with tip at the upper right atrium. Chronic cardiomegaly, status post mitral valve replacement. There is no edema, consolidation, effusion, or pneumothorax. IMPRESSION: No evidence of acute disease. Electronically Signed   By: Monte Fantasia M.D.   On: 11/02/2016 18:29   Dg Swallowing Func-speech Pathology  Result Date: 10/11/2016 Objective Swallowing Evaluation: Type of Study: MBS-Modified Barium Swallow Study Patient Details Name: Deborah [name] MRN: [id number] Date of Birth: 19-Jul-1956 Today's Date:  10/11/2016 Time: SLP Start Time (ACUTE ONLY): 1055-SLP Stop Time (ACUTE ONLY): 1109 SLP Time Calculation (min) (ACUTE ONLY): 14 min Past Medical History: Past Medical History: Diagnosis Date . Diabetes mellitus  . Hyperlipidemia  . Hypertension  Past Surgical History: Past Surgical History: Procedure Laterality Date . AV FISTULA PLACEMENT Left 10/02/2016  Procedure: INSERTION OF ARTERIOVENOUS (AV) GORE-TEX GRAFT ARM;  Surgeon: Waynetta Sandy, MD;  Location: [place];  Service: Vascular;  Laterality: Left; . CARDIAC CATHETERIZATION N/A 08/15/2016  Procedure: Left Heart Cath and Coronary Angiography;  Surgeon: Yolonda Kida, MD;  Location: [place] CV [facility];  Service: Cardiovascular;  Laterality: N/A; . CARDIAC CATHETERIZATION N/A 08/15/2016  Procedure: Coronary Stent Intervention;  Surgeon: Yolonda Kida, MD;  Location: [place] [facility];  Service: Cardiovascular;  Laterality: N/A; . CARDIAC CATHETERIZATION N/A 08/18/2016  Procedure: Right Heart Cath;  Surgeon: Jolaine Artist, MD;  Location: [place] [facility];  Service: Cardiovascular;  Laterality: N/A; . CARDIAC CATHETERIZATION N/A 08/18/2016  Procedure: IABP Insertion;  Surgeon: Jolaine Artist, MD;  Location: [place] [facility];  Service: Cardiovascular;  Laterality: N/A; . CARDIAC CATHETERIZATION Right 08/23/2016  Procedure: CENTRAL LINE INSERTION RIGHT SUBCLAVIAN;  Surgeon: Ivin Poot, MD;  Location: [place];  Service: Open Heart Surgery;  Laterality: Right; . ENDOVEIN HARVEST OF GREATER SAPHENOUS VEIN Left 08/23/2016  Procedure: ENDOVEIN HARVEST OF GREATER SAPHENOUS VEIN;  Surgeon: Ivin Poot, MD;  Location: [place];  Service: Open Heart Surgery;  Laterality: Left; . INSERTION OF DIALYSIS CATHETER Bilateral 08/31/2016  Procedure: INSERTION OF DIALYSIS CATHETER LEFT INTERNAL JUGULAR VEIN & INSERTION OF TRIPLE LUMEN RIGHT INTERNAL JUGULAR VEIN;  Surgeon: Angelia Mould, MD;  Location: [place];  Service: Vascular;  Laterality:  Bilateral; . INTRAOPERATIVE TRANSESOPHAGEAL ECHOCARDIOGRAM N/A 08/23/2016  Procedure: INTRAOPERATIVE TRANSESOPHAGEAL ECHOCARDIOGRAM;  Surgeon: Ivin Poot, MD;  Location: [place];  Service: Open Heart Surgery;  Laterality:  N/A; . MITRAL VALVE REPAIR N/A 08/23/2016  Procedure: MITRAL VALVE REPAIR (MVR) USING 25MM EDWARDS MAGNA EASE BIOPROSTHESIS MITRAL  VALVE;  Surgeon: Ivin Poot, MD;  Location: [place];  Service: Open Heart Surgery;  Laterality: N/A; . VAGINAL DELIVERY    x 6 HPI: 60 yo female admitted to [facility]15/16 with STEMI s/p cath to [place]. Developed cardiogenic shock, VDRF (ETT 9/14-9/29), AKI with metabolic acidosis and started on CRRT 08/17/16. Transferred to [facility] to tx shock and evaluated new severe MR from papillary muscle rupture. She also had fever and found to have Group B Strep in sputum culture.Mitral valve replacement 9/20. Swallow evaluation 9/30 with recs for NPO given poor mentation, severely impaired swallow function s/p prolonged intubation. Reintubated 9/30 secondary to septic shock; trach 10/5; new onset right hemiplegia and right gaze preference.  CT negative for acute changes; dx acute encephalopathy. MBS on 10/23 and Dys 2 with nectar thick liquids using a chin tuck began. 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 She had a baseline, congested cough throughout MBS that was not associated with aspiration. Mild piecemeal swallowing observed with pureed and regular solids. A delay in swallow initation to the valleculae with reduced laryngeal closure seen for all PO intake. Given thin liquid, pt had a single event of silent aspiration, but when utilizing a chin  tuck intermittent flash penetration occurred with no penetrate/aspirate residuals. Following multiple thin liquid trials, pt's strong cough resulted in retrograde backflow of boluses from the esophagus to the level of the valleculae. Retrograde backflow intermittently occurred into the pharynx with more solid trials; however, her airway remained protected throughout these events. Esophageal sweep showed mild build up of consistencies (MD not present to confirm). Recommend advancement to Dys 3 and thin liquids utilizing a chin tuck for every sip. MD may wish to consider further esophageal assessment given findings on most recent two MBS, which impacts pt's risk for post-prandial aspiration and may be influencing some of her coughing. Will continue to follow acutely for diet tolerance. Impact on safety and function Mild aspiration risk   [facility] IP TREATMENT RECOMMENDATION 10/11/2016 Treatment Recommendations Therapy as outlined in treatment plan below   Prognosis 10/11/2016 Prognosis for Safe Diet Advancement Good Barriers to Reach Goals -- Barriers/Prognosis Comment -- [facility] IP DIET RECOMMENDATION 10/11/2016 SLP Diet Recommendations Dysphagia 3 (Mech soft) solids;Thin liquid Liquid Administration via Cup;No straw Medication Administration Crushed with puree Compensations Minimize environmental distractions;Slow rate;Small sips/bites;Chin tuck;Follow solids with liquid Postural Changes Remain semi-upright after after feeds/meals (Comment);Seated upright at 90 degrees   [facility] IP OTHER RECOMMENDATIONS 10/11/2016 Recommended Consults -- Oral Care Recommendations Oral care BID Other Recommendations --   [facility] IP FOLLOW UP RECOMMENDATIONS 10/11/2016 Follow up Recommendations Inpatient Rehab   [facility] IP FREQUENCY AND DURATION 10/11/2016 Speech Therapy Frequency (ACUTE ONLY) min 2x/week Treatment Duration 2 weeks      [facility] IP ORAL PHASE 10/11/2016 Oral Phase Impaired Oral - Pudding Teaspoon -- Oral - Pudding Cup -- Oral - Honey Teaspoon -- Oral  - Honey Cup -- Oral - Nectar Teaspoon -- Oral - Nectar Cup NT Oral - Nectar Straw NT Oral - Thin Teaspoon NT Oral - Thin Cup WFL Oral - Thin Straw -- Oral - Puree Piecemeal swallowing Oral - Mech Soft NT Oral - Regular Piecemeal  swallowing Oral - Multi-Consistency NT Oral - Pill NT Oral Phase - Comment --  [facility] IP PHARYNGEAL PHASE 10/11/2016 Pharyngeal Phase Impaired Pharyngeal- Pudding Teaspoon -- Pharyngeal -- Pharyngeal- Pudding Cup -- Pharyngeal -- Pharyngeal- Honey Teaspoon -- Pharyngeal -- Pharyngeal- Honey Cup -- Pharyngeal -- Pharyngeal- Nectar Teaspoon -- Pharyngeal -- Pharyngeal- Nectar Cup NT Pharyngeal -- Pharyngeal- Nectar Straw NT Pharyngeal -- Pharyngeal- Thin Teaspoon NT Pharyngeal -- Pharyngeal- Thin Cup Penetration/Aspiration before swallow;Reduced airway/laryngeal closure;Delayed swallow initiation-vallecula;Compensatory strategies attempted (with notebox) Pharyngeal Material enters airway, passes BELOW cords without attempt by patient to eject out (silent aspiration);Material enters airway, remains ABOVE vocal cords then ejected out Pharyngeal- Thin Straw -- Pharyngeal -- Pharyngeal- Puree Delayed swallow initiation-vallecula;Reduced airway/laryngeal closure Pharyngeal -- Pharyngeal- Mechanical Soft -- Pharyngeal -- Pharyngeal- Regular Delayed swallow initiation-vallecula;Reduced airway/laryngeal closure Pharyngeal -- Pharyngeal- Multi-consistency -- Pharyngeal -- Pharyngeal- Pill NT Pharyngeal -- Pharyngeal Comment --  [facility] IP CERVICAL ESOPHAGEAL PHASE 10/11/2016 Cervical Esophageal Phase Impaired Pudding Teaspoon -- Pudding Cup -- Honey Teaspoon -- Honey Cup -- Nectar Teaspoon -- Nectar Cup -- Nectar Straw -- Thin Teaspoon -- Thin Cup Esophageal backflow into the pharynx Thin Straw -- Puree Esophageal backflow into the pharynx Mechanical Soft -- Regular Esophageal backflow into the pharynx Multi-consistency -- Pill -- Cervical Esophageal Comment -- No flowsheet data found. Germain Osgood  10/11/2016, 1:23 PM  Note populated for Jiles Prows, student SLP Germain Osgood, M.A. CCC-SLP [phone]               LOS: 6 days   Oren Binet, MD  [facility] Pager:[phone]  If 7PM-7AM, please contact night-coverage [email or web address] Password [email or web address] 11/08/2016, 10:40 AM

## 2016-11-08 NOTE — Procedures (Signed)
Pt seems weak, ? Pt effort, pt sitting up in bed, attempted NIF & VC each multiple times.  NIF -18, VC 0.5L, RT will monitor

## 2016-11-08 NOTE — Progress Notes (Signed)
Subjective: She is doing well currently. Apparently she did have some difficulty last night.  Objective: Vital signs in last 24 hours: Temp:  [97.5 F (36.4 C)-98.7 F (37.1 C)] 98 F (36.7 C) (12/06 1123) Pulse Rate:  [87-125] 92 (12/06 1123) Resp:  [19-38] 29 (12/06 1123) BP: (90-153)/(48-89) 107/65 (12/06 1123) SpO2:  [94 %-100 %] 98 % (12/06 1123) Weight:  [52.5 kg (115 lb 11.9 oz)] 52.5 kg (115 lb 11.9 oz) (12/05 1457) Last BM Date: 11/08/16  Intake/Output from previous day: 12/05 0701 - 12/06 0700 In: -  Out: 2000  Intake/Output this shift: Total I/O In: 240 [P.O.:240] Out: -   Very sleepy. She has no active audible stridor. No change in her neck.  Lab Results:   Recent Labs  11/07/16 1902 11/08/16 0433  WBC 27.1* 30.5*  HGB 13.7 12.3  HCT 42.0 38.4  PLT 462* 369   BMET  Recent Labs  11/07/16 1110 11/07/16 1902  NA 128* 134*  K 3.9 3.2*  CL 91* 93*  CO2 22 25  GLUCOSE 388* 147*  BUN 70* 20  CREATININE 7.04* 3.16*  CALCIUM 9.0 9.2   PT/INR  Recent Labs  11/07/16 1902 11/08/16 0433  LABPROT 21.2* 20.1*  INR 1.80 1.70   ABG  Recent Labs  11/07/16 1841  PHART 7.522*  HCO3 27.1    Studies/Results: Dg Chest 2 View  Result Date: 11/08/2016 CLINICAL DATA:  Short of breath, hypertension, diabetes EXAM: CHEST  2 VIEW COMPARISON:  Portable chest x-ray of 11/07/2016 FINDINGS: Opacity remains at the left arm right lung base with apparent air bronchograms most consistent with left lower lobe pneumonia. Mild atelectasis at the right lung base remains. No definite pleural effusion is seen. Cardiomegaly is stable. Left central venous line is unchanged in position. Mitral valve replacement is present. IMPRESSION: 1. Persistent left lower lobe opacity most suspicious for pneumonia. 2. Stable cardiomegaly with central venous line. 3. Right basilar linear atelectasis. Electronically Signed   By: Ivar Drape M.D.   On: 11/08/2016 08:39   Dg Chest Port 1  View  Result Date: 11/07/2016 CLINICAL DATA:  Dyspnea, hypertension, myocardial infarction and diabetes. EXAM: PORTABLE CHEST 1 VIEW COMPARISON:  11/04/2016 FINDINGS: There is stable mild cardiomegaly. The patient is status post mitral valvular replacement. Median sternotomy sutures are noted. Dialysis catheter from left IJ approach is noted with tip at the cavoatrial junction. No pneumothorax. Retrocardiac opacities are seen suspicious for left lower lobe pneumonia/atelectasis and probable small layering effusion. IMPRESSION: Retrocardiac opacity suspicious for pneumonia and/or atelectasis. Small layering left effusion is not entirely excluded. Otherwise no significant change. Electronically Signed   By: Ashley Royalty M.D.   On: 11/07/2016 20:54    Anti-infectives: Anti-infectives    Start     Dose/Rate Route Frequency Ordered Stop   11/09/16 1200  vancomycin (VANCOCIN) 500 mg in sodium chloride 0.9 % 100 mL IVPB     500 mg 100 mL/hr over 60 Minutes Intravenous Every T-Th-Sa (Hemodialysis) 11/08/16 0919     11/08/16 1000  ceFEPIme (MAXIPIME) 1 g in dextrose 5 % 50 mL IVPB     1 g 100 mL/hr over 30 Minutes Intravenous Every 24 hours 11/08/16 0919     11/08/16 0930  vancomycin (VANCOCIN) 1,250 mg in sodium chloride 0.9 % 250 mL IVPB     1,250 mg 166.7 mL/hr over 90 Minutes Intravenous  Once 11/08/16 0919        Assessment/Plan: s/p Procedure(s): TRACHEOSTOMY (N/A) I again discussed  the tracheotomy with the patient yesterday. Answered all her questions. Today based on information from the hospitalist service the family still had questions so I came by to address. There was a family member in the room. We again discussed the tracheotomy. We discussed how it's performed. We discussed postoperative care, benefits, future need and use of the trach. Risks discussed. We discussed the fact that the vocal cords may not be the entire problem with the airway difficulty and that the lungs or heart still may  be a component of the problem so it's not a certainty that the tracheotomy will fix her breathing issue completely. All the family member as well as patient's questions were answered.Will [proceed tomorrow.   LOS: 6 days    Melissa Montane 11/08/2016

## 2016-11-08 NOTE — Progress Notes (Signed)
ANTICOAGULATION CONSULT NOTE - f/u Consult  Pharmacy Consult for Heparin Indication: atrial fibrillation,   No Known Allergies  Patient Measurements: Height: 5\' 2"  (157.5 cm) Weight: 115 lb 11.9 oz (52.5 kg) IBW/kg (Calculated) : 50.1 Heparin Dosing Weight:  55kg  Vital Signs: Temp: 98.1 F (36.7 C) (12/06 1421) Temp Source: Oral (12/06 1421) BP: 116/70 (12/06 1421) Pulse Rate: 90 (12/06 1421)  Labs:  Recent Labs  11/07/16 0332 11/07/16 1110 11/07/16 1902 11/08/16 0433 11/08/16 0725 11/08/16 1500  HGB 12.0  --  13.7 12.3  --   --   HCT 37.5  --  42.0 38.4  --   --   PLT 509*  --  462* 369  --   --   LABPROT 28.6*  --  21.2* 20.1*  --   --   INR 2.63  --  1.80 1.70  --   --   HEPARINUNFRC  --   --   --  1.08* 0.28* 0.57  CREATININE  --  7.04* 3.16*  --   --   --   TROPONINI  --   --  0.07* 0.09*  --   --     Estimated Creatinine Clearance: 15.2 mL/min (by C-G formula based on SCr of 3.16 mg/dL (H)).   Medical History: Past Medical History:  Diagnosis Date  . Diabetes mellitus   . Hyperlipidemia   . Hypertension   . MI, old   . Renal disorder    dialysls    Assessment: 60 yo F presents on 11/30 with SOB. On Coumadin PTA for Afib, also has a recent MVR.   Anticoag: On Coumadin for Afib. 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 Recent prolonged hospitalization (d/c 11/29 from rehab). Hold Coumadin for trach. Hgb 12.3, Plts 369 falling. INR 3.18>2.63>1.7 today. HL 1.08>0.28 on recheck due to access restrictions in arm. PM HL 0.57 now in goal range. - Vitamin K 5mg  po x 1 on 12/4 - PTA dose (from previous discharge): 4mg  M/W/F and 2mg  T/T/S/S with admit INR 1.95. - 12/6: Heparin running in the hand (RN may have to hold hep, flush, then draw labs)   Goal of Therapy:  Heparin level 0.3-0.7 units/ml Monitor platelets by anticoagulation protocol: Yes   Plan:  - Continue IV heparin at 900 units/hr - Next HL and CBC in AM  Malachai Schalk S. Alford Highland, PharmD, BCPS Clinical  Staff Pharmacist Pager [phone]  [name] 11/08/2016,3:49 PM

## 2016-11-08 NOTE — Progress Notes (Addendum)
ANTICOAGULATION CONSULT NOTE - f/u Consult  Pharmacy Consult for Heparin + Vanco/Cefepime Indication: atrial fibrillation, HCAP  No Known Allergies  Patient Measurements: Height: 5\' 2"  (157.5 cm) Weight: 115 lb 11.9 oz (52.5 kg) IBW/kg (Calculated) : 50.1 Heparin Dosing Weight:  55kg  Vital Signs: Temp: 97.8 F (36.6 C) (12/06 0839) Temp Source: Oral (12/06 0839) BP: 124/70 (12/06 0839) Pulse Rate: 98 (12/06 0839)  Labs:  Recent Labs  11/07/16 0332 11/07/16 1110 11/07/16 1902 11/08/16 0433 11/08/16 0725  HGB 12.0  --  13.7 12.3  --   HCT 37.5  --  42.0 38.4  --   PLT 509*  --  462* 369  --   LABPROT 28.6*  --  21.2* 20.1*  --   INR 2.63  --  1.80 1.70  --   HEPARINUNFRC  --   --   --  1.08* 0.28*  CREATININE  --  7.04* 3.16*  --   --   TROPONINI  --   --  0.07* 0.09*  --     Estimated Creatinine Clearance: 15.2 mL/min (by C-G formula based on SCr of 3.16 mg/dL (H)).   Medical History: Past Medical History:  Diagnosis Date  . Diabetes mellitus   . Hyperlipidemia   . Hypertension   . MI, old   . Renal disorder    dialysls    Assessment: 60 yo F presents on 11/30 with SOB. On Coumadin PTA for Afib, also has a recent MVR.   Anticoag: On Coumadin for Afib. 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 Recent prolonged hospitalization (d/c 11/29 from rehab). Hold Coumadin for trach. Hgb 12.3, Plts 369 falling. INR 3.18>2.63>1.7 today. HL 1.08>0.28 on recheck due to access restrictions in arm. - Vitamin K 5mg  po x 1 on 12/4 - PTA dose (from previous discharge): 4mg  M/W/F and 2mg  T/T/S/S with admit INR 1.95. - 12/6: Heparin running in the hand (RN may have to hold hep, flush, then draw labs)   ID: Afeb, WBC trending up 30.5 (on steroids>taper). Afeb. Start Vanco/Cefepime #1 for HCAP on CXR 12/6. Dose for ESRD.  Antimicrobials: Vanco 12/6>> Cefepime 12/6>>   Microbiology results:  11/29 UCx: mult species - recollect?  12/1 MRSA PCR: positive 12/5 bC x 2:   Goal of  Therapy:  Heparin level 0.3-0.7 units/ml Monitor platelets by anticoagulation protocol: Yes   Plan:  - Increase IV heparin to 900 units/hr and recheck in 6 hrs. - Holding Coumadin for trach - Daily INR for now - Repeat trach 12/7 - Vancomycin 1250mg  IV x 1 then 500mg  IV TTS - Cefepime 1g IV q24h  Deborah [name], PharmD, BCPS Clinical Staff Pharmacist Pager [phone]  Deborah [name] 11/08/2016,8:48 AM

## 2016-11-08 NOTE — Progress Notes (Signed)
Subjective: HD yest- removed 2000- tolerated well.  Notes from rapid response but does not seem major intervention was needed   Objective Vital signs in last 24 hours: Vitals:   11/07/16 2002 11/07/16 2005 11/07/16 2334 11/08/16 0400  BP:   (!) 90/54 132/86  Pulse: (!) 118  (!) 108 (!) 109  Resp: 19  (!) 26 (!) 29  Temp:  97.9 F (36.6 C)  97.7 F (36.5 C)  TempSrc:  Oral  Axillary  SpO2: 96%  94% 98%  Weight:      Height:       Weight change: 0 kg (0 lb) Physical Exam: General: alert thin AAF chronically ill  , NAD  OX3  - sleeping alternating with coughing Heart: RRR no mur, rub or gallop Lungs: clear lungs post to bases bilat; insp wheezing resolved, +coarse rhonchi off and on  Abdomen: bs pos  Soft , NT, ND, Extremities: no pedal edema Dialysis Access:  L IJ Perm cath /pos bruit LUA AVG   Op Dialysis orders=  [place] unit  ( Dr. Juleen China follows/ [facility] )4hrs, 2k, 2.5 ca bath  [facility] perm cath, LUA AVF (11/02/16 ) no treatment meds listed  Don't have [name] dry wt, she was only at OP unit one day then readmitted   Problem/Plan: 1. Cough/ dyspnea - per ENT upper airway issues/ stridor- trach placement scheduled for tomorrow  2. ESRD - recent start in hosp 3 mos; TTS schedule ( Dr. [facility]), used avgg yest- went fine. Plan next treatment for tomorrow- will need to coordinate with surgery.  Look to get PC out next week if no issues with AVG 3. Volume - don't have [name] dry wt, euvol on exam at 52.5kg today 4. Anemia - hgb 11.6>9.6, now 12 ?   On darbe 60 q week  and iron- anticipate that hgb will drop around surgery  5. Secondary hyperparathyroidism -  Fosrenol binder/ no op vit d  needed with PTH 199  6. HTN/volume - was sent home on midodrine 10 qam, have d/cd 7. Paroxysmal atrial fibrillation  -per admit amiodarone,warfarin/ NS  On exam /tele. 8. HO recent NSTEMI with shock 9. 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 DM type 2 - per  admit 11. E[place]tes- k lowish- run on higher K bath, low sodium indicates more fluid to take (better after HD) - phos OK  Deborah [name]  11/08/2016, 7:20 AM    Labs: Basic Metabolic Panel:  Recent Labs Lab 11/03/16 0503 11/04/16 0358 11/07/16 1110 11/07/16 1902  NA 137 133* 128* 134*  K 3.7 3.4* 3.9 3.2*  CL 95* 94* 91* 93*  CO2 17* 26 22 25   GLUCOSE 181* 151* 388* 147*  BUN 13 29* 70* 20  CREATININE 3.01* 4.70* 7.04* 3.16*  CALCIUM 9.3 9.1 9.0 9.2  PHOS 4.6  --  3.7  --    Liver Function Tests:  Recent Labs Lab 11/02/16 1810 11/03/16 0503 11/07/16 1110 11/07/16 1902  AST 28  --   --  22  ALT 19  --   --  21  ALKPHOS 68  --   --  69  BILITOT 1.0  --   --  0.9  PROT 7.0  --   --  7.5  ALBUMIN 2.8* 3.1* 2.5* 3.1*   No results for input(s): LIPASE, AMYLASE in the last 168 hours. No results for input(s): AMMONIA in the last 168 hours. CBC:  Recent Labs Lab 11/02/16 1810  11/05/16  0402 11/06/16 0348 11/07/16 0332 11/07/16 1902 11/08/16 0433  WBC 13.9*  < > 18.9* 19.0* 31.9* 27.1* 30.5*  NEUTROABS 10.5*  --   --   --   --   --   --   HGB 11.3*  < > 10.7* 11.4* 12.0 13.7 12.3  HCT 36.4  < > 33.8* 36.2 37.5 42.0 38.4  MCV 87.7  < > 87.1 87.4 86.8 86.8 87.5  PLT 513*  < > 532* 561* 509* 462* 369  < > = values in this interval not displayed. Cardiac Enzymes:  Recent Labs Lab 11/07/16 1902 11/08/16 0433  TROPONINI 0.07* 0.09*   CBG:  Recent Labs Lab 11/07/16 0802 11/07/16 1311 11/07/16 1628 11/07/16 1854 11/07/16 2113  GLUCAP 210* 153* 125* 158* 126*    Studies/Results: Dg Chest Port 1 View  Result Date: 11/07/2016 CLINICAL DATA:  Dyspnea, hypertension, myocardial infarction and diabetes. EXAM: PORTABLE CHEST 1 VIEW COMPARISON:  11/04/2016 FINDINGS: There is stable mild cardiomegaly. The patient is status post mitral valvular replacement. Median sternotomy sutures are noted. Dialysis catheter from left IJ approach is noted with tip at the  cavoatrial junction. No pneumothorax. Retrocardiac opacities are seen suspicious for left lower lobe pneumonia/atelectasis and probable small layering effusion. IMPRESSION: Retrocardiac opacity suspicious for pneumonia and/or atelectasis. Small layering left effusion is not entirely excluded. Otherwise no significant change. Electronically Signed   By: Ashley Royalty M.D.   On: 11/07/2016 20:54   Medications: . heparin 800 Units/hr (11/07/16 2130)   . amiodarone  200 mg Oral Daily  . atorvastatin  20 mg Oral q1800  . clonazePAM  0.5 mg Oral BID  . clopidogrel  75 mg Oral Daily  . darbepoetin (ARANESP) injection - DIALYSIS  60 mcg Intravenous Q Sat-HD  . feeding supplement (NEPRO CARB STEADY)  237 mL Oral BID BM  . ferric gluconate (FERRLECIT/NULECIT) IV  125 mg Intravenous Q T,Th,Sa-HD  . fluticasone  2 spray Each Nare Daily  . insulin aspart  0-15 Units Subcutaneous TID WC  . insulin aspart  0-5 Units Subcutaneous QHS  . insulin detemir  12 Units Subcutaneous Q2200  . insulin detemir  8 Units Subcutaneous Daily  . lanthanum  1,000 mg Oral TID WC  . loratadine  10 mg Oral Daily  . metoCLOPramide  5 mg Oral BID WC  . midodrine  10 mg Oral q morning - 10a  . multivitamin  1 tablet Oral QHS  . pantoprazole  40 mg Oral BID  . sodium chloride flush  3 mL Intravenous Q12H  . Warfarin - Pharmacist Dosing Inpatient   Does not apply [phone]

## 2016-11-08 NOTE — Progress Notes (Signed)
PHARMACY - PHYSICIAN COMMUNICATION CRITICAL VALUE ALERT - BLOOD CULTURE IDENTIFICATION (BCID)  Results for orders placed or performed during the hospital encounter of 11/02/16  Blood Culture ID Panel (Reflexed) (Collected: 11/07/2016  7:02 PM)  Result Value Ref Range   Enterococcus species NOT DETECTED NOT DETECTED   Listeria monocytogenes NOT DETECTED NOT DETECTED   Staphylococcus species DETECTED (A) NOT DETECTED   Staphylococcus aureus DETECTED (A) NOT DETECTED   Methicillin resistance NOT DETECTED NOT DETECTED   Streptococcus species NOT DETECTED NOT DETECTED   Streptococcus agalactiae NOT DETECTED NOT DETECTED   Streptococcus pneumoniae NOT DETECTED NOT DETECTED   Streptococcus pyogenes NOT DETECTED NOT DETECTED   Acinetobacter baumannii NOT DETECTED NOT DETECTED   Enterobacteriaceae species NOT DETECTED NOT DETECTED   Enterobacter cloacae complex NOT DETECTED NOT DETECTED   Escherichia coli NOT DETECTED NOT DETECTED   Klebsiella oxytoca NOT DETECTED NOT DETECTED   Klebsiella pneumoniae NOT DETECTED NOT DETECTED   Proteus species NOT DETECTED NOT DETECTED   Serratia marcescens NOT DETECTED NOT DETECTED   Haemophilus influenzae NOT DETECTED NOT DETECTED   Neisseria meningitidis NOT DETECTED NOT DETECTED   Pseudomonas aeruginosa NOT DETECTED NOT DETECTED   Candida albicans NOT DETECTED NOT DETECTED   Candida glabrata NOT DETECTED NOT DETECTED   Candida krusei NOT DETECTED NOT DETECTED   Candida parapsilosis NOT DETECTED NOT DETECTED   Candida tropicalis NOT DETECTED NOT DETECTED    Name of physician (or Provider) Contacted: K Schorr ([facility])  Changes to prescribed antibiotics required: Continue vancomycin/cefepime for now  Narda Bonds 11/08/2016  11:48 PM

## 2016-11-09 ENCOUNTER — Inpatient Hospital Stay (HOSPITAL_COMMUNITY): Payer: Medicaid Other

## 2016-11-09 ENCOUNTER — Inpatient Hospital Stay (HOSPITAL_COMMUNITY): Payer: Medicaid Other | Admitting: Anesthesiology

## 2016-11-09 ENCOUNTER — Encounter (HOSPITAL_COMMUNITY)
Admission: EM | Disposition: A | Discharge status: Other Acute Inpatient Hospital | Payer: Self-pay | Source: Home / Self Care | Attending: Pulmonary Disease

## 2016-11-09 ENCOUNTER — Encounter (HOSPITAL_COMMUNITY): Payer: Self-pay | Admitting: Certified Registered Nurse Anesthetist

## 2016-11-09 DIAGNOSIS — D72828 Other elevated white blood cell count: Secondary | ICD-10-CM

## 2016-11-09 DIAGNOSIS — R7881 Bacteremia: Secondary | ICD-10-CM

## 2016-11-09 DIAGNOSIS — Z930 Tracheostomy status: Secondary | ICD-10-CM

## 2016-11-09 DIAGNOSIS — Z952 Presence of prosthetic heart valve: Secondary | ICD-10-CM

## 2016-11-09 DIAGNOSIS — J189 Pneumonia, unspecified organism: Secondary | ICD-10-CM

## 2016-11-09 DIAGNOSIS — Z803 Family history of malignant neoplasm of breast: Secondary | ICD-10-CM

## 2016-11-09 DIAGNOSIS — Z8249 Family history of ischemic heart disease and other diseases of the circulatory system: Secondary | ICD-10-CM

## 2016-11-09 DIAGNOSIS — Y95 Nosocomial condition: Secondary | ICD-10-CM

## 2016-11-09 DIAGNOSIS — Z833 Family history of diabetes mellitus: Secondary | ICD-10-CM

## 2016-11-09 DIAGNOSIS — J9690 Respiratory failure, unspecified, unspecified whether with hypoxia or hypercapnia: Secondary | ICD-10-CM

## 2016-11-09 HISTORY — PX: TRACHEOSTOMY TUBE PLACEMENT: SHX814

## 2016-11-09 LAB — CBC
HCT: 37.9 % (ref 36.0–46.0)
HCT: 35.3 % — ABNORMAL LOW (ref 36.0–46.0)
Hemoglobin: 12 g/dL (ref 12.0–15.0)
Hemoglobin: 11.1 g/dL — ABNORMAL LOW (ref 12.0–15.0)
MCH: 28 pg (ref 26.0–34.0)
MCH: 27.9 pg (ref 26.0–34.0)
MCHC: 31.7 g/dL (ref 30.0–36.0)
MCHC: 31.4 g/dL (ref 30.0–36.0)
MCV: 88.3 fL (ref 78.0–100.0)
MCV: 88.7 fL (ref 78.0–100.0)
PLATELETS: 350 10*3/uL (ref 150–400)
PLATELETS: 305 10*3/uL (ref 150–400)
RBC: 4.29 MIL/uL (ref 3.87–5.11)
RBC: 3.98 MIL/uL (ref 3.87–5.11)
RDW: 19.3 % — AB (ref 11.5–15.5)
RDW: 19.3 % — AB (ref 11.5–15.5)
WBC: 24.5 10*3/uL — ABNORMAL HIGH (ref 4.0–10.5)
WBC: 18.8 10*3/uL — ABNORMAL HIGH (ref 4.0–10.5)

## 2016-11-09 LAB — PROTIME-INR
INR: 1.41
PROTHROMBIN TIME: 17.4 s — AB (ref 11.4–15.2)

## 2016-11-09 LAB — GLUCOSE, CAPILLARY
GLUCOSE-CAPILLARY: 156 mg/dL — AB (ref 65–99)
Glucose-Capillary: 107 mg/dL — ABNORMAL HIGH (ref 65–99)
Glucose-Capillary: 112 mg/dL — ABNORMAL HIGH (ref 65–99)
Glucose-Capillary: 122 mg/dL — ABNORMAL HIGH (ref 65–99)
Glucose-Capillary: 193 mg/dL — ABNORMAL HIGH (ref 65–99)

## 2016-11-09 LAB — RENAL FUNCTION PANEL
ALBUMIN: 2.8 g/dL — AB (ref 3.5–5.0)
ANION GAP: 17 — AB (ref 5–15)
BUN: 18 mg/dL (ref 6–20)
CALCIUM: 8.6 mg/dL — AB (ref 8.9–10.3)
CO2: 18 mmol/L — AB (ref 22–32)
CREATININE: 2.62 mg/dL — AB (ref 0.44–1.00)
Chloride: 100 mmol/L — ABNORMAL LOW (ref 101–111)
GFR calc Af Amer: 22 mL/min — ABNORMAL LOW (ref >60)
GFR calc non Af Amer: 19 mL/min — ABNORMAL LOW (ref >60)
GLUCOSE: 139 mg/dL — AB (ref 65–99)
PHOSPHORUS: 3.5 mg/dL (ref 2.5–4.6)
Potassium: 4.2 mmol/L (ref 3.5–5.1)
SODIUM: 135 mmol/L (ref 135–145)

## 2016-11-09 LAB — POCT I-STAT 3, ART BLOOD GAS (G3+)
ACID-BASE DEFICIT: 4 mmol/L — AB (ref 0.0–2.0)
BICARBONATE: 20 mmol/L (ref 20.0–28.0)
O2 SAT: 97 %
PCO2 ART: 33.8 mmHg (ref 32.0–48.0)
PO2 ART: 97 mmHg (ref 83.0–108.0)
Patient temperature: 100.4
TCO2: 21 mmol/L (ref 0–100)
pH, Arterial: 7.386 (ref 7.350–7.450)

## 2016-11-09 LAB — POCT I-STAT 4, (NA,K, GLUC, HGB,HCT)
GLUCOSE: 103 mg/dL — AB (ref 65–99)
HEMATOCRIT: 41 % (ref 36.0–46.0)
Hemoglobin: 13.9 g/dL (ref 12.0–15.0)
Potassium: 4.1 mmol/L (ref 3.5–5.1)
SODIUM: 135 mmol/L (ref 135–145)

## 2016-11-09 LAB — HEPARIN LEVEL (UNFRACTIONATED): Heparin Unfractionated: 0.4 IU/mL (ref 0.30–0.70)

## 2016-11-09 SURGERY — CREATION, TRACHEOSTOMY
Anesthesia: General | Site: Throat

## 2016-11-09 MED ORDER — LIDOCAINE HCL (CARDIAC) 20 MG/ML IV SOLN
INTRAVENOUS | Status: DC | PRN
  Administered 2016-11-09: 80 mg via INTRAVENOUS

## 2016-11-09 MED ORDER — FENTANYL CITRATE (PF) 100 MCG/2ML IJ SOLN
INTRAMUSCULAR | Status: AC
  Filled 2016-11-09: qty 2

## 2016-11-09 MED ORDER — SODIUM CHLORIDE 0.9 % IV SOLN
INTRAVENOUS | Status: DC | PRN
  Administered 2016-11-09 (×2): via INTRAVENOUS

## 2016-11-09 MED ORDER — LIDOCAINE HCL (PF) 1 % IJ SOLN
INTRAMUSCULAR | Status: AC
  Filled 2016-11-09: qty 30

## 2016-11-09 MED ORDER — DOPAMINE-DEXTROSE 3.2-5 MG/ML-% IV SOLN
0.0000 ug/kg/min | INTRAVENOUS | Status: DC
  Administered 2016-11-09: 5 ug/kg/min via INTRAVENOUS
  Administered 2016-11-09: 10 ug/kg/min via INTRAVENOUS
  Filled 2016-11-09: qty 250

## 2016-11-09 MED ORDER — LORAZEPAM 2 MG/ML IJ SOLN
0.5000 mg | Freq: Once | INTRAMUSCULAR | Status: AC
  Administered 2016-11-09: 0.5 mg via INTRAVENOUS

## 2016-11-09 MED ORDER — MIDAZOLAM HCL 2 MG/2ML IJ SOLN
INTRAMUSCULAR | Status: AC
  Filled 2016-11-09: qty 2

## 2016-11-09 MED ORDER — ROCURONIUM BROMIDE 100 MG/10ML IV SOLN
INTRAVENOUS | Status: DC | PRN
  Administered 2016-11-09: 50 mg via INTRAVENOUS

## 2016-11-09 MED ORDER — PHENYLEPHRINE HCL 10 MG/ML IJ SOLN
INTRAVENOUS | Status: DC | PRN
  Administered 2016-11-09: 60 ug/min via INTRAVENOUS

## 2016-11-09 MED ORDER — RIFAMPIN ORAL SUSPENSION 25 MG/ML
300.0000 mg | Freq: Three times a day (TID) | ORAL | Status: DC
  Administered 2016-11-09 – 2016-11-10 (×2): 300 mg via ORAL
  Filled 2016-11-09 (×3): qty 5

## 2016-11-09 MED ORDER — DEXMEDETOMIDINE HCL IN NACL 200 MCG/50ML IV SOLN
INTRAVENOUS | Status: DC | PRN
  Administered 2016-11-09: 0.7 ug/kg/h via INTRAVENOUS

## 2016-11-09 MED ORDER — PROPOFOL 10 MG/ML IV BOLUS
INTRAVENOUS | Status: AC
  Filled 2016-11-09: qty 20

## 2016-11-09 MED ORDER — PHENYLEPHRINE HCL 10 MG/ML IJ SOLN
INTRAMUSCULAR | Status: DC | PRN
  Administered 2016-11-09: 160 ug via INTRAVENOUS
  Administered 2016-11-09 (×3): 120 ug via INTRAVENOUS

## 2016-11-09 MED ORDER — LORAZEPAM 2 MG/ML IJ SOLN
INTRAMUSCULAR | Status: AC
  Administered 2016-11-09: 0.5 mg via INTRAVENOUS
  Filled 2016-11-09: qty 1

## 2016-11-09 MED ORDER — 0.9 % SODIUM CHLORIDE (POUR BTL) OPTIME
TOPICAL | Status: DC | PRN
  Administered 2016-11-09: 1000 mL

## 2016-11-09 MED ORDER — PROPOFOL 10 MG/ML IV BOLUS
INTRAVENOUS | Status: DC | PRN
  Administered 2016-11-09: 120 mg via INTRAVENOUS

## 2016-11-09 MED ORDER — ALBUMIN HUMAN 5 % IV SOLN
INTRAVENOUS | Status: DC | PRN
  Administered 2016-11-09: 15:00:00 via INTRAVENOUS

## 2016-11-09 MED ORDER — FENTANYL CITRATE (PF) 100 MCG/2ML IJ SOLN: 25.0000 ug | INTRAMUSCULAR | Status: DC | PRN

## 2016-11-09 MED ORDER — ORAL CARE MOUTH RINSE
15.0000 mL | Freq: Four times a day (QID) | OROMUCOSAL | Status: DC
  Administered 2016-11-09 – 2016-11-26 (×55): 15 mL via OROMUCOSAL

## 2016-11-09 MED ORDER — LIDOCAINE-EPINEPHRINE (PF) 1 %-1:200000 IJ SOLN
INTRAMUSCULAR | Status: AC
  Filled 2016-11-09: qty 30

## 2016-11-09 MED ORDER — INSULIN ASPART 100 UNIT/ML ~~LOC~~ SOLN
0.0000 [IU] | SUBCUTANEOUS | Status: DC
  Administered 2016-11-09: 3 [IU] via SUBCUTANEOUS
  Administered 2016-11-10: 5 [IU] via SUBCUTANEOUS
  Administered 2016-11-10: 3 [IU] via SUBCUTANEOUS
  Administered 2016-11-10: 2 [IU] via SUBCUTANEOUS
  Administered 2016-11-11: 11 [IU] via SUBCUTANEOUS
  Administered 2016-11-11: 5 [IU] via SUBCUTANEOUS
  Administered 2016-11-11: 8 [IU] via SUBCUTANEOUS
  Administered 2016-11-11 (×2): 5 [IU] via SUBCUTANEOUS
  Administered 2016-11-11: 8 [IU] via SUBCUTANEOUS
  Administered 2016-11-12 (×2): 2 [IU] via SUBCUTANEOUS
  Administered 2016-11-12 (×2): 3 [IU] via SUBCUTANEOUS
  Administered 2016-11-14: 2 [IU] via SUBCUTANEOUS
  Administered 2016-11-14: 5 [IU] via SUBCUTANEOUS
  Administered 2016-11-14: 2 [IU] via SUBCUTANEOUS
  Administered 2016-11-15: 3 [IU] via SUBCUTANEOUS

## 2016-11-09 MED ORDER — SUCCINYLCHOLINE CHLORIDE 20 MG/ML IJ SOLN
INTRAMUSCULAR | Status: DC | PRN
  Administered 2016-11-09: 100 mg via INTRAVENOUS

## 2016-11-09 MED ORDER — DEXMEDETOMIDINE HCL IN NACL 200 MCG/50ML IV SOLN
0.4000 ug/kg/h | INTRAVENOUS | Status: DC
  Administered 2016-11-09: 0.4 ug/kg/h via INTRAVENOUS
  Administered 2016-11-09: 0.7 ug/kg/h via INTRAVENOUS
  Administered 2016-11-10: 0.4 ug/kg/h via INTRAVENOUS
  Administered 2016-11-10: 0.7 ug/kg/h via INTRAVENOUS
  Administered 2016-11-10: 0.8 ug/kg/h via INTRAVENOUS
  Filled 2016-11-09 (×5): qty 50

## 2016-11-09 MED ORDER — INSULIN DETEMIR 100 UNIT/ML ~~LOC~~ SOLN
8.0000 [IU] | Freq: Every day | SUBCUTANEOUS | Status: DC
  Filled 2016-11-09: qty 0.08

## 2016-11-09 MED ORDER — CHLORHEXIDINE GLUCONATE 0.12% ORAL RINSE (MEDLINE KIT)
15.0000 mL | Freq: Two times a day (BID) | OROMUCOSAL | Status: DC
  Administered 2016-11-09 – 2016-11-25 (×30): 15 mL via OROMUCOSAL

## 2016-11-09 SURGICAL SUPPLY — 36 items
BLADE SURG 15 STRL LF DISP TIS (BLADE) IMPLANT
BLADE SURG 15 STRL SS (BLADE)
BLADE SURG ROTATE 9660 (MISCELLANEOUS) IMPLANT
CANISTER SUCTION 2500CC (MISCELLANEOUS) ×3 IMPLANT
CLEANER TIP ELECTROSURG 2X2 (MISCELLANEOUS) ×3 IMPLANT
CLOSURE WOUND 1/2 X4 (GAUZE/BANDAGES/DRESSINGS)
COVER SURGICAL LIGHT HANDLE (MISCELLANEOUS) ×3 IMPLANT
CRADLE DONUT ADULT HEAD (MISCELLANEOUS) IMPLANT
DRAPE PROXIMA HALF (DRAPES) IMPLANT
ELECT COATED BLADE 2.86 ST (ELECTRODE) ×3 IMPLANT
ELECT REM PT RETURN 9FT ADLT (ELECTROSURGICAL) ×3
ELECTRODE REM PT RTRN 9FT ADLT (ELECTROSURGICAL) ×1 IMPLANT
GAUZE SPONGE 4X4 16PLY XRAY LF (GAUZE/BANDAGES/DRESSINGS) ×3 IMPLANT
GLOVE ECLIPSE 7.5 STRL STRAW (GLOVE) ×6 IMPLANT
GOWN STRL REUS W/ TWL LRG LVL3 (GOWN DISPOSABLE) ×2 IMPLANT
GOWN STRL REUS W/TWL LRG LVL3 (GOWN DISPOSABLE) ×4
KIT BASIN OR (CUSTOM PROCEDURE TRAY) ×3 IMPLANT
KIT ROOM TURNOVER OR (KITS) ×3 IMPLANT
NEEDLE HYPO 25GX1X1/2 BEV (NEEDLE) ×3 IMPLANT
NS IRRIG 1000ML POUR BTL (IV SOLUTION) ×3 IMPLANT
PAD ARMBOARD 7.5X6 YLW CONV (MISCELLANEOUS) ×6 IMPLANT
PENCIL FOOT CONTROL (ELECTRODE) ×3 IMPLANT
SPONGE DRAIN TRACH 4X4 STRL 2S (GAUZE/BANDAGES/DRESSINGS) IMPLANT
STRIP CLOSURE SKIN 1/2X4 (GAUZE/BANDAGES/DRESSINGS) IMPLANT
SURGILUBE 2OZ TUBE FLIPTOP (MISCELLANEOUS) IMPLANT
SUT CHROMIC GUT 2 0 PS 2 27 (SUTURE) ×3 IMPLANT
SUT ETHILON 3 0 PS 1 (SUTURE) ×3 IMPLANT
SUT SILK 3 0 SH 30 (SUTURE) ×3 IMPLANT
SUT SILK 3 0 TIES 17X18 (SUTURE) ×2
SUT SILK 3-0 18XBRD TIE BLK (SUTURE) ×1 IMPLANT
SYR CONTROL 10ML LL (SYRINGE) ×3 IMPLANT
TOWEL OR 17X24 6PK STRL BLUE (TOWEL DISPOSABLE) IMPLANT
TRAY ENT MC OR (CUSTOM PROCEDURE TRAY) ×3 IMPLANT
TUBE CONNECTING 12'X1/4 (SUCTIONS) ×1
TUBE CONNECTING 12X1/4 (SUCTIONS) ×2 IMPLANT
WATER STERILE IRR 1000ML POUR (IV SOLUTION) ×3 IMPLANT

## 2016-11-09 NOTE — Anesthesia Preprocedure Evaluation (Addendum)
Anesthesia Evaluation  Patient identified by MRN, date of birth, ID band Patient awake    Reviewed: Allergy & Precautions, NPO status , Patient's Chart, lab work & pertinent test results  Airway Mallampati: III  TM Distance: >3 FB Neck ROM: Full  Mouth opening: Limited Mouth Opening  Dental  (+) Dental Advisory Given   Pulmonary pneumonia,  Respiratory distress post tracheostomy. Plan for repeat trach today.   breath sounds clear to auscultation       Cardiovascular hypertension, Pt. on medications + CAD, + Past MI and +CHF  + dysrhythmias  Rhythm:Irregular Rate:Normal  Normal LV size with mild LV hypertrophy. EF 60-65%. Normal RV   size with mildly decreased systolic function. No evidence for   pulmonary hypertension. The interventricular septum is shifted   mildly to the left but there is no evidence for marked RV   abnormality. The bioprosthetic mitral valve appears to function   normally.   Neuro/Psych PSYCHIATRIC DISORDERS negative neurological ROS     GI/Hepatic negative GI ROS, Neg liver ROS,   Endo/Other  diabetes  Renal/GU Dialysis and ESRFRenal disease     Musculoskeletal   Abdominal   Peds  Hematology  (+) anemia ,   Anesthesia Other Findings   Reproductive/Obstetrics                            Lab Results  Component Value Date   WBC 24.5 (H) 11/09/2016   HGB 12.0 11/09/2016   HCT 37.9 11/09/2016   MCV 88.3 11/09/2016   PLT 350 11/09/2016   Lab Results  Component Value Date   CREATININE 3.16 (H) 11/07/2016   BUN 20 11/07/2016   NA 134 (L) 11/07/2016   K 3.2 (L) 11/07/2016   CL 93 (L) 11/07/2016   CO2 25 11/07/2016    Anesthesia Physical Anesthesia Plan  ASA: III  Anesthesia Plan: General   Post-op Pain Management:    Induction: Intravenous  Airway Management Planned: Oral ETT  Additional Equipment:   Intra-op Plan:   Post-operative Plan: Possible  Post-op intubation/ventilation  Informed Consent: I have reviewed the patients History and Physical, chart, labs and discussed the procedure including the risks, benefits and alternatives for the proposed anesthesia with the patient or authorized representative who has indicated his/her understanding and acceptance.   Dental advisory given  Plan Discussed with: CRNA  Anesthesia Plan Comments:         Anesthesia Quick Evaluation

## 2016-11-09 NOTE — Progress Notes (Signed)
PROGRESS NOTE        PATIENT DETAILS Name: Deborah [name] Age: 60 y.o. Sex: female Date of Birth: Jan 19, 1956 Admit Date: 11/02/2016 Admitting Physician Javier Glazier, MD CXK:GYJEHU,DJSHFWYO AZBELL, MD  Brief Narrative: Patient is a 60 y.o. female with recent prolonged hospitalization (from 9/15-11/8) for STEMI requiring PCI (at [facility]), complicated by cardiac [facility] shock and acute hypoxemic respiratory failure requiring ventilator support due to papillary muscle rupture causing severe MR. She was transferred to [facility], and underwent mitral valve replacement on 9/20. Postoperatively, she developed septic shock secondary to healthcare associated pneumonia, atrial flutter requiring cardioversion, and unfortunately developed renal failure and is now ESRD and dependent on dialysis. She subsequently required a tracheostomy. She was transferred to  [facility] on 11/8. She was subsequently stabilized and discharged home on 11/29, she is now status post decannulation of her recent tracheostomy. 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 ENT on board and in discussion with family, tentatively tracheostomy scheduled for 12/7. Hospital course has been complicated by anxiety spells/panic attacks, and possible healthcare associated pneumonia.  SIGNIFICANT EVENTS  09/12 Admit to [facility] 09/15 To [facility] 09/20 MVR with bioprosthetic valve 09/28 Permcath for HD 09/29 Extubated 09/30 Reintubated 10/05 Trach by Hyman Bible 10/09 Off vent 10/25 decannulated 11/8 to CIR 11/29 DC CIR 11/30 admit for resp distress 12/5 persistent tachycardia, chest x-ray suggests pneumonia. 12/7. Due for another tracheostomy by ENT  Subjective:  She is in bed, denies any fever or chills, no headache or  chest pain, mild shortness of breath and cough, no abdominal pain, feels weak all over but no focal weakness.   Assessment/Plan:  Dyspnea: felt to be secondary to vocal cord dysfunction. Recent CT neck on 11/17 did not show any tracheal stenosi.ENT following, after much discussion with family she is now willing to proceed with a tracheostomy, per ENT-scheduled tentatively for 12/7. Continue supportive care, now off steroids.   Leukocytosis: Due to combination of recent steroid use and now chest x-ray suggesting pneumonia, improving on vancomycin and cefepime, blood cultures preliminary growing staph aureus, ID likely to follow per protocol, clinically better, repeat chest x-ray tomorrow.  Possible healthcare associated pneumonia: See above  Paroxysmal atrial fibrillation: [place] vasc 2 score of at least 3. Appears to be sinus rhythm, continue amiodarone for rate control-INR now less than 2, and on IV heparin in anticipation of tracheostomy on 12/7. Pharmacy bridging Coumadin and heparin.   Recent Inferior posterior STEMI on 9/13 with DES x 2 to OM-1: Continue Plavix and statin-denies chest pain. No acute cardiac issues.  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  Chronic hypotension: On Midodrine  Anemia: Secondary to chronic disease/ESRD, defer to nephrology  Anxiety: Hospital course complicated by episodes of panic attacks/anxiety-have started scheduled Klonopin, continue with as needed Xanax and Ativan for now.  Insulin dependent type 2 diabetes: CBGs initially elevated due to steroids, steroids now being tapered down-CBGs relatively stable-have decreased Levemir to 8 units in a.m. and 12 units  in p.m. monitor CBGs.  Lab Results  Component  Value Date   HGBA1C 13.3 (H) 08/15/2016   CBG (last 3)   Recent Labs  11/08/16 1643 11/08/16 2056 11/09/16 0754  GLUCAP 170* 109* 107*      DVT Prophylaxis: On IV Heparin/Coumadin  Code Status: Full code   Family Communication: Daughter at bedside-she does not   Disposition Plan: Remain in SDU  Antimicrobial agents: None  Procedures: None  CONSULTS:  pulmonary/intensive care, nephrology and ENT, ID per protocol  Time spent: 25 minutes-Greater than 50% of this time was spent in counseling, explanation of diagnosis, planning of further management, and coordination of care.  MEDICATIONS: Anti-infectives    Start     Dose/Rate Route Frequency Ordered Stop   11/09/16 1200  vancomycin (VANCOCIN) 500 mg in sodium chloride 0.9 % 100 mL IVPB     500 mg 100 mL/hr over 60 Minutes Intravenous Every T-Th-Sa (Hemodialysis) 11/08/16 0919     11/08/16 1000  ceFEPIme (MAXIPIME) 1 g in dextrose 5 % 50 mL IVPB     1 g 100 mL/hr over 30 Minutes Intravenous Every 24 hours 11/08/16 0919     11/08/16 0930  vancomycin (VANCOCIN) 1,250 mg in sodium chloride 0.9 % 250 mL IVPB     1,250 mg 166.7 mL/hr over 90 Minutes Intravenous  Once 11/08/16 0919 11/08/16 1415      Scheduled Meds: . amiodarone  200 mg Oral Daily  . atorvastatin  20 mg Oral q1800  . ceFEPime (MAXIPIME) IV  1 g Intravenous Q24H  . clonazePAM  0.5 mg Oral BID  . clopidogrel  75 mg Oral Daily  . darbepoetin (ARANESP) injection - DIALYSIS  60 mcg Intravenous Q Sat-HD  . feeding supplement (NEPRO CARB STEADY)  237 mL Oral BID BM  . ferric gluconate (FERRLECIT/NULECIT) IV  125 mg Intravenous Q T,Th,Sa-HD  . fluticasone  2 spray Each Nare Daily  . insulin aspart  0-15 Units Subcutaneous TID WC  . insulin aspart  0-5 Units Subcutaneous QHS  . insulin detemir  12 Units Subcutaneous Q2200  . insulin detemir  8 Units Subcutaneous Daily  . lanthanum  1,000 mg Oral TID WC  . loratadine  10 mg Oral Daily  .  metoCLOPramide  5 mg Oral BID WC  . midodrine  10 mg Oral q morning - 10a  . multivitamin  1 tablet Oral QHS  . pantoprazole  40 mg Oral BID  . sodium chloride flush  3 mL Intravenous Q12H  . vancomycin  500 mg Intravenous Q T,Th,Sa-HD  . Warfarin - Pharmacist Dosing Inpatient   Does not apply q1800   Continuous Infusions: . heparin 900 Units/hr (11/09/16 0113)   PRN Meds:.levalbuterol, LORazepam, menthol-cetylpyridinium, morphine injection   PHYSICAL EXAM: Vital signs: Vitals:   11/09/16 0841 11/09/16 0851 11/09/16 0856 11/09/16 0900  BP: 122/64 125/69  115/70  Pulse: 95 94  95  Resp: (!) 25 (!) 26  (!) 25  Temp: 98.5 F (36.9 C)     TempSrc: Oral     SpO2: 100% 100% 100% 100%  Weight: 52.9 kg (116 lb 10 oz)     Height:       Filed Weights   11/08/16 0500 11/09/16 0500 11/09/16 0841  Weight: 51.7 kg (113 lb 15.7 oz) 51.6 kg (113 lb 12.1 oz) 52.9 kg (116 lb 10 oz)   Body mass index is 21.33 kg/m.   General appearance :Awake, alert, not in any distress. Mild upper airway  stridor  Eyes:, pupils equally reactive to light and accomodation,no scleral icterus. HEENT: Atraumatic and Normocephalic Neck: supple, no JVD. No cervical lymphadenopathy.  Resp:Good air entry bilaterally, no added sounds  CVS: S1 S2 regular GI: Bowel sounds present, Non tender and not distended with no gaurding, rigidity or rebound.No organomegaly Extremities: B/L Lower Ext shows no edema, both legs are warm to touch Neurology:  speech clear ,Non focal, sensation is grossly intact. Psychiatric: Normal judgment and insight. Alert and oriented x 3.  Musculoskeletal:No digital cyanosis Skin:No Rash, warm and dry    I have personally reviewed following labs and imaging studies  LABORATORY DATA: CBC:  Recent Labs Lab 11/02/16 1810  11/06/16 0348 11/07/16 0332 11/07/16 1902 11/08/16 0433 11/09/16 0421  WBC 13.9*  < > 19.0* 31.9* 27.1* 30.5* 24.5*  NEUTROABS 10.5*  --   --   --   --   --   --    HGB 11.3*  < > 11.4* 12.0 13.7 12.3 12.0  HCT 36.4  < > 36.2 37.5 42.0 38.4 37.9  MCV 87.7  < > 87.4 86.8 86.8 87.5 88.3  PLT 513*  < > 561* 509* 462* 369 350  < > = values in this interval not displayed.  Basic Metabolic Panel:  Recent Labs Lab 11/02/16 1810 11/02/16 1823 11/03/16 0503 11/04/16 0358 11/07/16 1110 11/07/16 1902  NA 133*  --  137 133* 128* 134*  K 3.7  --  3.7 3.4* 3.9 3.2*  CL 95*  --  95* 94* 91* 93*  CO2 20*  --  17* 26 22 25   GLUCOSE 104*  --  181* 151* 388* 147*  BUN 7  --  13 29* 70* 20  CREATININE 1.99*  --  3.01* 4.70* 7.04* 3.16*  CALCIUM 8.9  --  9.3 9.1 9.0 9.2  MG  --  1.7  --   --   --   --   PHOS  --   --  4.6  --  3.7  --     GFR: Estimated Creatinine Clearance: 15.2 mL/min (by C-G formula based on SCr of 3.16 mg/dL (H)).  Liver Function Tests:  Recent Labs Lab 11/02/16 1810 11/03/16 0503 11/07/16 1110 11/07/16 1902  AST 28  --   --  22  ALT 19  --   --  21  ALKPHOS 68  --   --  69  BILITOT 1.0  --   --  0.9  PROT 7.0  --   --  7.5  ALBUMIN 2.8* 3.1* 2.5* 3.1*   No results for input(s): LIPASE, AMYLASE in the last 168 hours. No results for input(s): AMMONIA in the last 168 hours.  Coagulation Profile:  Recent Labs Lab 11/06/16 0348 11/07/16 0332 11/07/16 1902 11/08/16 0433 11/09/16 0421  INR 3.18 2.63 1.80 1.70 1.41    Cardiac Enzymes:  Recent Labs Lab 11/07/16 1902 11/08/16 0433  TROPONINI 0.07* 0.09*    BNP (last 3 results) No results for input(s): PROBNP in the last 8760 hours.  HbA1C: No results for input(s): HGBA1C in the last 72 hours.  CBG:  Recent Labs Lab 11/08/16 0836 11/08/16 1119 11/08/16 1643 11/08/16 2056 11/09/16 0754  GLUCAP 152* 203* 170* 109* 107*    Lipid Profile: No results for input(s): CHOL, HDL, LDLCALC, TRIG, CHOLHDL, LDLDIRECT in the last 72 hours.  Thyroid Function Tests: No results for input(s): TSH, T4TOTAL, FREET4, T3FREE, THYROIDAB in the last 72 hours.  Anemia  Panel: No results for  input(s): VITAMINB12, FOLATE, FERRITIN, TIBC, IRON, RETICCTPCT in the last 72 hours.  Urine analysis:    Component Value Date/Time   COLORURINE YELLOW 10/30/2016 2120   APPEARANCEUR CLOUDY (A) 10/30/2016 2120   LABSPEC 1.021 10/30/2016 2120   PHURINE 5.0 10/30/2016 2120   GLUCOSEU NEGATIVE 10/30/2016 2120   HGBUR NEGATIVE 10/30/2016 2120   BILIRUBINUR SMALL (A) 10/30/2016 2120   [place] NEGATIVE 10/30/2016 2120   PROTEINUR 30 (A) 10/30/2016 2120   NITRITE NEGATIVE 10/30/2016 2120   LEUKOCYTESUR SMALL (A) 10/30/2016 2120    Sepsis Labs: Lactic Acid, Venous    Component Value Date/Time   LATICACIDVEN 0.89 09/10/2016 0736    MICROBIOLOGY: Recent Results (from the past 240 hour(s))  Urine culture     Status: Abnormal   Collection Time: 10/30/16  9:19 PM  Result Value Ref Range Status   Specimen Description URINE, CLEAN CATCH  Final   Special Requests NONE  Final   Culture MULTIPLE SPECIES PRESENT, SUGGEST RECOLLECTION (A)  Final   Report Status 11/01/2016 FINAL  Final  MRSA PCR Screening     Status: Abnormal   Collection Time: 11/03/16  5:42 AM  Result Value Ref Range Status   MRSA by PCR POSITIVE (A) NEGATIVE Final    Comment:        The GeneXpert MRSA Assay (FDA approved for NASAL specimens only), is one component of a comprehensive MRSA colonization surveillance program. It is not intended to diagnose MRSA infection nor to guide or monitor treatment for MRSA infections. RESULT CALLED TO, READ BACK BY AND VERIFIED WITH: M.SCHMIT RN AT [phone] 11/03/16 BY A.DAVIS   Culture, blood (Routine X 2) w Reflex to ID Panel     Status: None (Preliminary result)   Collection Time: 11/07/16  7:02 PM  Result Value Ref Range Status   Specimen Description BLOOD RIGHT ANTECUBITAL  Final   Special Requests IN PEDIATRIC BOTTLE 2CC  Final   Culture  Setup Time   Final    AEROBIC BOTTLE ONLY GRAM POSITIVE COCCI Organism ID to follow CRITICAL RESULT CALLED TO,  READ BACK BY AND VERIFIED WITH: Narda Bonds, PHARMD @2343  11/08/16 MKELLY,MLT    Culture PENDING  Incomplete   Report Status PENDING  Incomplete  Blood Culture ID Panel (Reflexed)     Status: Abnormal   Collection Time: 11/07/16  7:02 PM  Result Value Ref Range Status   Enterococcus species NOT DETECTED NOT DETECTED Final   Listeria monocytogenes NOT DETECTED NOT DETECTED Final   Staphylococcus species DETECTED (A) NOT DETECTED Final    Comment: CRITICAL RESULT CALLED TO, READ BACK BY AND VERIFIED WITH: JAMES LEDFORD, PHARMD @2343  11/08/16 MKELLY,MLT    Staphylococcus aureus DETECTED (A) NOT DETECTED Final    Comment: CRITICAL RESULT CALLED TO, READ BACK BY AND VERIFIED WITH: JAMES LEDFORD, PHARMD @2343  11/08/16 MKELLY,MLT    Methicillin resistance NOT DETECTED NOT DETECTED Final   Streptococcus species NOT DETECTED NOT DETECTED Final   Streptococcus agalactiae NOT DETECTED NOT DETECTED Final   Streptococcus pneumoniae NOT DETECTED NOT DETECTED Final   Streptococcus pyogenes NOT DETECTED NOT DETECTED Final   Acinetobacter baumannii NOT DETECTED NOT DETECTED Final   Enterobacteriaceae species NOT DETECTED NOT DETECTED Final   Enterobacter cloacae complex NOT DETECTED NOT DETECTED Final   Escherichia coli NOT DETECTED NOT DETECTED Final   Klebsiella oxytoca NOT DETECTED NOT DETECTED Final   Klebsiella pneumoniae NOT DETECTED NOT DETECTED Final   Proteus species NOT DETECTED NOT DETECTED Final   Serratia marcescens  NOT DETECTED NOT DETECTED Final   Haemophilus influenzae NOT DETECTED NOT DETECTED Final   Neisseria meningitidis NOT DETECTED NOT DETECTED Final   Pseudomonas aeruginosa NOT DETECTED NOT DETECTED Final   Candida albicans NOT DETECTED NOT DETECTED Final   Candida glabrata NOT DETECTED NOT DETECTED Final   Candida krusei NOT DETECTED NOT DETECTED Final   Candida parapsilosis NOT DETECTED NOT DETECTED Final   Candida tropicalis NOT DETECTED NOT DETECTED Final  Culture,  blood (Routine X 2) w Reflex to ID Panel     Status: None (Preliminary result)   Collection Time: 11/07/16  7:08 PM  Result Value Ref Range Status   Specimen Description BLOOD RIGHT HAND  Final   Special Requests BOTTLES DRAWN AEROBIC ONLY 5CC  Final   Culture  Setup Time   Final    GRAM POSITIVE COCCI IN CLUSTERS AEROBIC BOTTLE ONLY CRITICAL VALUE NOTED.  VALUE IS CONSISTENT WITH PREVIOUSLY REPORTED AND CALLED VALUE.    Culture GRAM POSITIVE COCCI  Final   Report Status PENDING  Incomplete    RADIOLOGY STUDIES/RESULTS: Dg Chest 2 View  Result Date: 11/08/2016 CLINICAL DATA:  Short of breath, hypertension, diabetes EXAM: CHEST  2 VIEW COMPARISON:  Portable chest x-ray of 11/07/2016 FINDINGS: Opacity remains at the left arm right lung base with apparent air bronchograms most consistent with left lower lobe pneumonia. Mild atelectasis at the right lung base remains. No definite pleural effusion is seen. Cardiomegaly is stable. Left central venous line is unchanged in position. Mitral valve replacement is present. IMPRESSION: 1. Persistent left lower lobe opacity most suspicious for pneumonia. 2. Stable cardiomegaly with central venous line. 3. Right basilar linear atelectasis. Electronically Signed   By: Ivar Drape M.D.   On: 11/08/2016 08:39   Dg Chest 2 View  Result Date: 11/01/2016 CLINICAL DATA:  Wheezing, shortness of breath, cough. EXAM: CHEST  2 VIEW COMPARISON:  Radiographs of October 30, 2016. FINDINGS: Stable cardiomegaly. Status post cardiac valve repair. Left internal jugular dialysis catheter is unchanged in position. No pneumothorax or pleural effusion is noted. No acute pulmonary disease is noted. Bony thorax is unremarkable. IMPRESSION: No active cardiopulmonary disease. Electronically Signed   By: Marijo Conception, M.D.   On: 11/01/2016 07:25   Dg Chest 2 View  Result Date: 10/30/2016 CLINICAL DATA:  Cough and leukocytosis EXAM: CHEST  2 VIEW COMPARISON:  Chest radiograph  October 15, 2016 and chest CT October 21, 2016 FINDINGS: There is persistent consolidation in the medial bases bilaterally, slightly more although left than on the right. No new parenchymal lung opacity evident. Heart is borderline prominent pulmonary vascularity within normal limits. No adenopathy. Port-A-Cath tip is at the cavoatrial junction. No pneumothorax. There is a mitral valve replacement. No bone lesions. IMPRESSION: Bibasilar consolidation. No new opacity. Stable cardiac silhouette. Electronically Signed   By: Lowella Grip III M.D.   On: 10/30/2016 10:46   Dg Chest 2 View  Result Date: 10/15/2016 CLINICAL DATA:  Cough.  Chest pain EXAM: CHEST  2 VIEW COMPARISON:  10/11/2016 FINDINGS: There is a left chest wall dialysis catheter with tips in the cavoatrial junction. Moderate cardiac enlargement. Previous median sternotomy and mitral bowel replacement. No pleural effusion or edema. No airspace opacities. IMPRESSION: 1. No acute cardiopulmonary abnormalities. Electronically Signed   By: Kerby Moors M.D.   On: 10/15/2016 09:13   Dg Chest 2 View  Result Date: 10/11/2016 CLINICAL DATA:  Dyspnea and cough for 1 month. EXAM: CHEST  2 VIEW COMPARISON:  10/08/2016, 08/30/2016 FINDINGS: There is stable cardiomegaly. There is subpleural scarring in the left mid lung and left lung base. There may be a small right lower lobe infiltrate. No pneumothorax, effusion or CHF. Status post median sternotomy and mitral valvular repair. Dual-lumen left jugular central line catheter extends into the right atrium, unchanged in appearance. IMPRESSION: Stable cardiomegaly. Faint opacities in the right lower lobe medially which may reflect a small pneumonic consolidation. Atelectasis and/or scarring at the left lung base. Electronically Signed   By: Ashley Royalty M.D.   On: 10/11/2016 18:24   Ct Soft Tissue Neck Wo Contrast  Result Date: 10/20/2016 CLINICAL DATA:  None. FINDINGS: Pharynx and larynx: Layering  secretions in the hypopharynx extending to the bilateral piriform sinus. Normal larynx. Mild proximal tracheal wall irregularity without stenosis. Salivary glands: Normal. Thyroid: Normal. Lymph nodes: No lymphadenopathy by CT size criteria though sensitivity decreased without contrast. Vascular: Retropharyngeal course of the carotid arteries. Tunneled dialysis catheter via LEFT internal jugular venous approach. Limited intracranial: Nonacute. Mild calcific atherosclerosis of the carotid siphons. Visualized orbits: Normal. Mastoids and visualized paranasal sinuses: Mild bilateral maxillary sinus mucosal thickening without air-fluid levels. Mastoid air cells are well aerated. Skeleton: Broad reversed cervical lordosis with moderate to severe C5-6 and C6-7 degenerative discs. Status post median sternotomy. Tooth 8 and LEFT mandible molar periapical lucency/abscess. Upper chest: Lung apices are clear. Markedly enlarged partially imaged main pulmonary artery versus mediastinal mass. Asymmetrically smaller LEFT mainstem bronchus. Other: Tracheostomy scar. IMPRESSION: Mild proximal tracheal wall irregularity without stenosis. Layering secretions in hypopharynx presents aspiration risk. Asymmetrically smaller LEFT lower lobe mainstem bronchus. Markedly enlarged main pulmonary artery versus mediastinal mass. Recommend CT chest. Electronically Signed   By: Elon Alas M.D.   On: 10/20/2016 18:33   Ct Chest Wo Contrast  Result Date: 10/21/2016 CLINICAL DATA:  60 year old female with possible mediastinal mass identified on neck CT. EXAM: CT CHEST WITHOUT CONTRAST TECHNIQUE: Multidetector CT imaging of the chest was performed following the standard protocol without IV contrast. COMPARISON:  10/20/2016 neck CT. FINDINGS: Cardiovascular: The possible mediastinal mass identified on neck CT represents a mildly enlarged main pulmonary artery with a diameter of 3.7 cm. Cardiomegaly, mitral valve replacement and left IJ  central venous catheter with tip at the superior cavoatrial junction noted. No pericardial effusion. Mediastinum/Nodes: No mass, enlarged lymph nodes identified. Lungs/Pleura: Moderate bilateral lower lobe atelectasis identified. There is no evidence of pleural effusion, pneumothorax or mass. Upper Abdomen: No acute abnormality. Musculoskeletal: Median sternotomy changes noted. No acute bony abnormalities present. IMPRESSION: Mildly enlarged main pulmonary artery (3.7 cm) accounting for possible mediastinal mass identified on recent neck CT. Moderate bilateral lower lobe atelectasis. Cardiomegaly and mitral valve replacement. Electronically Signed   By: Margarette Canada M.D.   On: 10/21/2016 17:34   Dg Chest Port 1 View  Result Date: 11/07/2016 CLINICAL DATA:  Dyspnea, hypertension, myocardial infarction and diabetes. EXAM: PORTABLE CHEST 1 VIEW COMPARISON:  11/04/2016 FINDINGS: There is stable mild cardiomegaly. The patient is status post mitral valvular replacement. Median sternotomy sutures are noted. Dialysis catheter from left IJ approach is noted with tip at the cavoatrial junction. No pneumothorax. Retrocardiac opacities are seen suspicious for left lower lobe pneumonia/atelectasis and probable small layering effusion. IMPRESSION: Retrocardiac opacity suspicious for pneumonia and/or atelectasis. Small layering left effusion is not entirely excluded. Otherwise no significant change. Electronically Signed   By: Ashley Royalty M.D.   On: 11/07/2016 20:54   Dg Chest Port 1 View  Result Date: 11/04/2016 CLINICAL  DATA:  Acute respiratory failure with hypoxia. EXAM: PORTABLE CHEST 1 VIEW COMPARISON:  11/02/2016. FINDINGS: Cardiomegaly. Prior median sternotomy for valvular disease. Dual lumen catheter from LEFT IJ approach lies with tips in the proximal RA. No infiltrates or edema. No effusion or pneumothorax. Mild subsegmental atelectasis may be present in the retrocardiac region. IMPRESSION: Cardiomegaly.  Dual  lumen catheter in satisfactory position. Slight increase in retrocardiac opacity could represent subsegmental atelectasis. Electronically Signed   By: Staci Righter M.D.   On: 11/04/2016 11:11   Dg Chest Portable 1 View  Result Date: 11/02/2016 CLINICAL DATA:  Shortness of breath and tachypnea EXAM: PORTABLE CHEST 1 VIEW COMPARISON:  Yesterday FINDINGS: Dialysis catheter on the left with tip at the upper right atrium. Chronic cardiomegaly, status post mitral valve replacement. There is no edema, consolidation, effusion, or pneumothorax. IMPRESSION: No evidence of acute disease. Electronically Signed   By: Monte Fantasia M.D.   On: 11/02/2016 18:29   Dg Swallowing Func-speech Pathology  Result Date: 10/11/2016 Objective Swallowing Evaluation: Type of Study: MBS-Modified Barium Swallow Study Patient Details Name: JURNIE GARRITANO MRN: [id number] Date of Birth: 10-09-1956 Today's Date: 10/11/2016 Time: SLP Start Time (ACUTE ONLY): 1055-SLP Stop Time (ACUTE ONLY): 1109 SLP Time Calculation (min) (ACUTE ONLY): 14 min Past Medical History: Past Medical History: Diagnosis Date . Diabetes mellitus  . Hyperlipidemia  . Hypertension  Past Surgical History: Past Surgical History: Procedure Laterality Date . AV FISTULA PLACEMENT Left 10/02/2016  Procedure: INSERTION OF ARTERIOVENOUS (AV) GORE-TEX GRAFT ARM;  Surgeon: Waynetta Sandy, MD;  Location: [place];  Service: Vascular;  Laterality: Left; . CARDIAC CATHETERIZATION N/A 08/15/2016  Procedure: Left Heart Cath and Coronary Angiography;  Surgeon: Yolonda Kida, MD;  Location: [place] [facility];  Service: Cardiovascular;  Laterality: N/A; . CARDIAC CATHETERIZATION N/A 08/15/2016  Procedure: Coronary Stent Intervention;  Surgeon: Yolonda Kida, MD;  Location: [place] [facility];  Service: Cardiovascular;  Laterality: N/A; . CARDIAC CATHETERIZATION N/A 08/18/2016  Procedure: Right Heart Cath;  Surgeon: Jolaine Artist, MD;  Location: [place] [facility]  [facility];  Service: Cardiovascular;  Laterality: N/A; . CARDIAC CATHETERIZATION N/A 08/18/2016  Procedure: IABP Insertion;  Surgeon: Jolaine Artist, MD;  Location: [place] [facility];  Service: Cardiovascular;  Laterality: N/A; . CARDIAC CATHETERIZATION Right 08/23/2016  Procedure: CENTRAL LINE INSERTION RIGHT SUBCLAVIAN;  Surgeon: Ivin Poot, MD;  Location: [place];  Service: Open Heart Surgery;  Laterality: Right; . ENDOVEIN HARVEST OF GREATER SAPHENOUS VEIN Left 08/23/2016  Procedure: ENDOVEIN HARVEST OF GREATER SAPHENOUS VEIN;  Surgeon: Ivin Poot, MD;  Location: [place];  Service: Open Heart Surgery;  Laterality: Left; . INSERTION OF DIALYSIS CATHETER Bilateral 08/31/2016  Procedure: INSERTION OF DIALYSIS CATHETER LEFT INTERNAL JUGULAR VEIN & INSERTION OF TRIPLE LUMEN RIGHT INTERNAL JUGULAR VEIN;  Surgeon: Angelia Mould, MD;  Location: [place];  Service: Vascular;  Laterality: Bilateral; . INTRAOPERATIVE TRANSESOPHAGEAL ECHOCARDIOGRAM N/A 08/23/2016  Procedure: INTRAOPERATIVE TRANSESOPHAGEAL ECHOCARDIOGRAM;  Surgeon: Ivin Poot, MD;  Location: [place];  Service: Open Heart Surgery;  Laterality: N/A; . MITRAL VALVE REPAIR N/A 08/23/2016  Procedure: MITRAL VALVE REPAIR (MVR) USING 25MM EDWARDS MAGNA EASE BIOPROSTHESIS MITRAL  VALVE;  Surgeon: Ivin Poot, MD;  Location: [place];  Service: Open Heart Surgery;  Laterality: N/A; . VAGINAL DELIVERY    x 6 HPI: 60 yo female admitted to [facility] 08/15/16 with STEMI s/p cath to [place]. Developed cardiogenic shock, VDRF (ETT 9/14-9/29), AKI with metabolic acidosis and started  on CRRT 08/17/16. 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 She also had fever and found to have Group B Strep in sputum culture.Mitral valve replacement 9/20. Swallow evaluation 9/30 with recs for NPO given poor mentation, severely impaired swallow function s/p prolonged intubation. Reintubated 9/30 secondary to septic shock; trach 10/5; new  onset right hemiplegia and right gaze preference.  CT negative for acute changes; dx acute encephalopathy. MBS on 10/23 and Dys 2 with nectar thick liquids using a chin tuck began. 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 She had a baseline, congested cough throughout MBS that was not associated with aspiration. Mild piecemeal swallowing observed with pureed and regular solids. A delay in swallow initation to the valleculae with reduced laryngeal closure seen for all PO intake. Given thin liquid, pt had a single event of silent aspiration, but when utilizing a chin tuck intermittent flash penetration occurred with no penetrate/aspirate residuals. Following multiple thin liquid trials, pt's strong cough resulted in retrograde backflow of boluses from the esophagus to the level of the valleculae. Retrograde backflow intermittently occurred into the pharynx with more solid trials; however, her airway remained protected throughout these events. Esophageal sweep showed mild build up of consistencies (MD not present to confirm). Recommend advancement to Dys 3 and thin liquids utilizing a chin tuck for every sip. MD may wish to consider further esophageal assessment given findings on most recent two MBS, which impacts pt's risk for post-prandial aspiration and may be influencing some of her coughing. Will continue to follow acutely for diet tolerance. Impact on safety and function Mild aspiration risk   CHL IP TREATMENT RECOMMENDATION 10/11/2016 Treatment Recommendations Therapy as outlined in treatment plan below   Prognosis 10/11/2016 Prognosis for Safe Diet Advancement Good Barriers to Reach  Goals -- Barriers/Prognosis Comment -- CHL IP DIET RECOMMENDATION 10/11/2016 SLP Diet Recommendations Dysphagia 3 (Mech soft) solids;Thin liquid Liquid Administration via Cup;No straw Medication Administration Crushed with puree Compensations Minimize environmental distractions;Slow rate;Small sips/bites;Chin tuck;Follow solids with liquid Postural Changes Remain semi-upright after after feeds/meals (Comment);Seated upright at 90 degrees   CHL IP OTHER RECOMMENDATIONS 10/11/2016 Recommended Consults -- Oral Care Recommendations Oral care BID Other Recommendations --   CHL IP FOLLOW UP RECOMMENDATIONS 10/11/2016 Follow up Recommendations Inpatient Rehab   CHL IP FREQUENCY AND DURATION 10/11/2016 Speech Therapy Frequency (ACUTE ONLY) min 2x/week Treatment Duration 2 weeks      CHL IP ORAL PHASE 10/11/2016 Oral Phase Impaired Oral - Pudding Teaspoon -- Oral - Pudding Cup -- Oral - Honey Teaspoon -- Oral - Honey Cup -- Oral - Nectar Teaspoon -- Oral - Nectar Cup NT Oral - Nectar Straw NT Oral - Thin Teaspoon NT Oral - Thin Cup WFL Oral - Thin Straw -- Oral - Puree Piecemeal swallowing Oral - Mech Soft NT Oral - Regular Piecemeal swallowing Oral - Multi-Consistency NT Oral - Pill NT Oral Phase - Comment --  CHL IP PHARYNGEAL PHASE 10/11/2016 Pharyngeal Phase Impaired Pharyngeal- Pudding Teaspoon -- Pharyngeal -- Pharyngeal- Pudding Cup -- Pharyngeal -- Pharyngeal- Honey Teaspoon -- Pharyngeal -- Pharyngeal- Honey Cup -- Pharyngeal -- Pharyngeal- Nectar Teaspoon -- Pharyngeal -- Pharyngeal- Nectar Cup NT Pharyngeal -- Pharyngeal- Nectar Straw NT Pharyngeal -- Pharyngeal- Thin Teaspoon NT Pharyngeal -- Pharyngeal- Thin Cup  Penetration/Aspiration before swallow;Reduced airway/laryngeal closure;Delayed swallow initiation-vallecula;Compensatory strategies attempted (with notebox) Pharyngeal Material enters airway, passes BELOW cords without attempt by patient to eject out (silent aspiration);Material enters airway, remains ABOVE  vocal cords then ejected out Pharyngeal- Thin Straw -- Pharyngeal -- Pharyngeal- Puree Delayed swallow initiation-vallecula;Reduced airway/laryngeal closure Pharyngeal -- Pharyngeal- Mechanical Soft -- Pharyngeal -- Pharyngeal- Regular Delayed swallow initiation-vallecula;Reduced airway/laryngeal closure Pharyngeal -- Pharyngeal- Multi-consistency -- Pharyngeal -- Pharyngeal- Pill NT Pharyngeal -- Pharyngeal Comment --  CHL IP CERVICAL ESOPHAGEAL PHASE 10/11/2016 Cervical Esophageal Phase Impaired Pudding Teaspoon -- Pudding Cup -- Honey Teaspoon -- Honey Cup -- Nectar Teaspoon -- Nectar Cup -- Nectar Straw -- Thin Teaspoon -- Thin Cup Esophageal backflow into the pharynx Thin Straw -- Puree Esophageal backflow into the pharynx Mechanical Soft -- Regular Esophageal backflow into the pharynx Multi-consistency -- Pill -- Cervical Esophageal Comment -- No flowsheet data found. Germain Osgood 10/11/2016, 1:23 PM  Note populated for Jiles Prows, student SLP Germain Osgood, M.A. CCC-SLP [phone]               LOS: 7 days   Thurnell Lose, MD  [facility] Pager:[phone]  If 7PM-7AM, please contact night-coverage [email or web address] Password [email or web address] 11/09/2016, 9:28 AM

## 2016-11-09 NOTE — Progress Notes (Signed)
This RN notified by unit secretary that patient will not be returning to 3S10 and will be transferred to ICU after surgery. All belongings gathered from room, placed in belongings bags, and placed at nurse's station until patient receives a bed. Belongings include: multiple puzzle books, a bible, a pair of pants, a pair of shoes, a home nebulizer machine from [facility], and a denture cup containing 2 bracelets and 1 ring. Report will be given to next nurse when patient receives a new bed.  Joellen Jersey, RN.

## 2016-11-09 NOTE — Procedures (Signed)
Patient was seen on dialysis and the procedure was supervised.  BFR 400  Via AVG BP is  120/61.   Patient appears to be tolerating treatment well  Sissy Goetzke A 11/09/2016

## 2016-11-09 NOTE — Progress Notes (Signed)
Pt unavailable in HD for NIF and VC.  Trach scheduled for today.

## 2016-11-09 NOTE — Anesthesia Procedure Notes (Addendum)
Procedure Name: Intubation Date/Time: 11/09/2016 2:40 PM Performed by: Salli Quarry Dacota Devall Pre-anesthesia Checklist: Patient identified, Emergency Drugs available, Suction available and Patient being monitored Patient Re-evaluated:Patient Re-evaluated prior to inductionOxygen Delivery Method: Circle System Utilized Preoxygenation: Pre-oxygenation with 100% oxygen Intubation Type: IV induction and Rapid sequence Ventilation: Mask ventilation without difficulty Laryngoscope Size: Glidescope and 3 Grade View: Grade I Tube type: Oral Tube size: 6.0 mm Number of attempts: 1 Airway Equipment and Method: Stylet Placement Confirmation: ETT inserted through vocal cords under direct vision,  positive ETCO2 and breath sounds checked- equal and bilateral Secured at: 21 cm Tube secured with: Tape Dental Injury: Teeth and Oropharynx as per pre-operative assessment

## 2016-11-09 NOTE — Progress Notes (Signed)
Subjective: Seen on HD today- frequent coughing and rapid resp rate- for elective trach later today   Objective Vital signs in last 24 hours: Vitals:   11/09/16 0841 11/09/16 0851 11/09/16 0856 11/09/16 0900  BP: 122/64 125/69  115/70  Pulse: 95 94  95  Resp: (!) 25 (!) 26  (!) 25  Temp: 98.5 F (36.9 C)     TempSrc: Oral     SpO2: 100% 100% 100% 100%  Weight: 52.9 kg (116 lb 10 oz)     Height:       Weight change: -2.9 kg (-6 lb 6.3 oz) Physical Exam: General: alert thin AAF chronically ill  , NAD  OX3  - sleeping alternating with coughing Heart: RRR no mur, rub or gallop Lungs: clear lungs post to bases bilat; insp wheezing resolved, +coarse rhonchi off and on  Abdomen: bs pos  Soft , NT, ND, Extremities: no pedal edema Dialysis Access:  L IJ Perm cath /pos bruit LUA AVG   Op Dialysis orders=  [place] unit  ( Dr. Juleen China follows/ [facility] )4hrs, 2k, 2.5 ca bath  LIJ perm cath, LUA AVF (11/02/16 ) no treatment meds listed  Don't have [name] dry wt, she was only at OP unit one day then readmitted   Problem/Plan: 1. Cough/ dyspnea - per ENT upper airway issues/ stridor- trach placement scheduled for today  2. ESRD - recent start in hosp 3 mos; TTS schedule ( Dr. [facility]), using avgg - went fine.  Look to get PC out next week if no issues with AVG 3. Volume - don't have [name] dry wt, euvol on exam at 52.9 kg today 4. Anemia - hgb 11.6>9.6, now 12 ?   On darbe 60 q week  and iron- anticipate that hgb will drop around surgery  5. Secondary hyperparathyroidism -  Fosrenol binder/ no op vit d  needed with PTH 199  6. HTN/volume - was sent home on midodrine 10 qam, have d/cd 7. Paroxysmal atrial fibrillation  -per admit amiodarone,warfarin/ NS  On exam /tele. 8. HO recent NSTEMI with shock 9. 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 DM type 2 - per admit 11. Elytes- k lowish- run on higher K bath, low sodium indicates more fluid to take  (better after HD) - phos OK  Deborah [name]  11/09/2016, 10:24 AM    Labs: Basic Metabolic Panel:  Recent Labs Lab 11/03/16 0503 11/04/16 0358 11/07/16 1110 11/07/16 1902  NA 137 133* 128* 134*  K 3.7 3.4* 3.9 3.2*  CL 95* 94* 91* 93*  CO2 17* 26 22 25   GLUCOSE 181* 151* 388* 147*  BUN 13 29* 70* 20  CREATININE 3.01* 4.70* 7.04* 3.16*  CALCIUM 9.3 9.1 9.0 9.2  PHOS 4.6  --  3.7  --    Liver Function Tests:  Recent Labs Lab 11/02/16 1810 11/03/16 0503 11/07/16 1110 11/07/16 1902  AST 28  --   --  22  ALT 19  --   --  21  ALKPHOS 68  --   --  69  BILITOT 1.0  --   --  0.9  PROT 7.0  --   --  7.5  ALBUMIN 2.8* 3.1* 2.5* 3.1*   No results for input(s): LIPASE, AMYLASE in the last 168 hours. No results for input(s): AMMONIA in the last 168 hours. CBC:  Recent Labs Lab 11/02/16 1810  11/06/16 0348 11/07/16 0332 11/07/16 1902 11/08/16 0433 11/09/16 0421  WBC 13.9*  < >  19.0* 31.9* 27.1* 30.5* 24.5*  NEUTROABS 10.5*  --   --   --   --   --   --   HGB 11.3*  < > 11.4* 12.0 13.7 12.3 12.0  HCT 36.4  < > 36.2 37.5 42.0 38.4 37.9  MCV 87.7  < > 87.4 86.8 86.8 87.5 88.3  PLT 513*  < > 561* 509* 462* 369 350  < > = values in this interval not displayed. Cardiac Enzymes:  Recent Labs Lab 11/07/16 1902 11/08/16 0433  TROPONINI 0.07* 0.09*   CBG:  Recent Labs Lab 11/08/16 0836 11/08/16 1119 11/08/16 1643 11/08/16 2056 11/09/16 0754  GLUCAP 152* 203* 170* 109* 107*    Studies/Results: Dg Chest 2 View  Result Date: 11/08/2016 CLINICAL DATA:  Short of breath, hypertension, diabetes EXAM: CHEST  2 VIEW COMPARISON:  Portable chest x-ray of 11/07/2016 FINDINGS: Opacity remains at the left arm right lung base with apparent air bronchograms most consistent with left lower lobe pneumonia. Mild atelectasis at the right lung base remains. No definite pleural effusion is seen. Cardiomegaly is stable. Left central venous line is unchanged in position.  Mitral valve replacement is present. IMPRESSION: 1. Persistent left lower lobe opacity most suspicious for pneumonia. 2. Stable cardiomegaly with central venous line. 3. Right basilar linear atelectasis. Electronically Signed   By: Ivar Drape M.D.   On: 11/08/2016 08:39   Dg Chest Port 1 View  Result Date: 11/07/2016 CLINICAL DATA:  Dyspnea, hypertension, myocardial infarction and diabetes. EXAM: PORTABLE CHEST 1 VIEW COMPARISON:  11/04/2016 FINDINGS: There is stable mild cardiomegaly. The patient is status post mitral valvular replacement. Median sternotomy sutures are noted. Dialysis catheter from left IJ approach is noted with tip at the cavoatrial junction. No pneumothorax. Retrocardiac opacities are seen suspicious for left lower lobe pneumonia/atelectasis and probable small layering effusion. IMPRESSION: Retrocardiac opacity suspicious for pneumonia and/or atelectasis. Small layering left effusion is not entirely excluded. Otherwise no significant change. Electronically Signed   By: Ashley Royalty M.D.   On: 11/07/2016 20:54   Medications: . heparin 900 Units/hr (11/09/16 0113)   . amiodarone  200 mg Oral Daily  . atorvastatin  20 mg Oral q1800  . ceFEPime (MAXIPIME) IV  1 g Intravenous Q24H  . clonazePAM  0.5 mg Oral BID  . clopidogrel  75 mg Oral Daily  . darbepoetin (ARANESP) injection - DIALYSIS  60 mcg Intravenous Q Sat-HD  . feeding supplement (NEPRO CARB STEADY)  237 mL Oral BID BM  . ferric gluconate (FERRLECIT/NULECIT) IV  125 mg Intravenous Q T,Th,Sa-HD  . fluticasone  2 spray Each Nare Daily  . insulin aspart  0-15 Units Subcutaneous TID WC  . insulin aspart  0-5 Units Subcutaneous QHS  . insulin detemir  12 Units Subcutaneous Q2200  . [START ON 11/10/2016] insulin detemir  8 Units Subcutaneous Daily  . lanthanum  1,000 mg Oral TID WC  . loratadine  10 mg Oral Daily  . metoCLOPramide  5 mg Oral BID WC  . midodrine  10 mg Oral q morning - 10a  . multivitamin  1 tablet Oral QHS   . pantoprazole  40 mg Oral BID  . sodium chloride flush  3 mL Intravenous Q12H  . vancomycin  500 mg Intravenous Q T,Th,Sa-HD  . Warfarin - Pharmacist Dosing Inpatient   Does not apply [phone]

## 2016-11-09 NOTE — Op Note (Signed)
Preop/postop diagnosis: Respiratory distress  Procedure: Tracheotomy and direct laryngoscopy Anesthesia: Gen. Estimated blood loss: Less than 5 mL Indications: 60 year old who has had significant medical issues with valve replacement and pulmonary problems that resulted in a previous tracheotomy. She was decannulated sent home and was having stridor breathing difficulties. She was readmitted to the hospital now with intermittent and some persistent stridor. She has bilateral vocal cord dysfunction. The cords do not open with inspiration. The family as well as the patient were informed risk and benefits of the procedure and options were discussed all questions are answered and consent was obtained.  Procedure: Patient was taken the operating room placed in the supine position after general endotracheal tube anesthesia was prepped and draped in the usual sterile manner. The incision was made over the same area of scar where the previous stoma had closed. It was dissected down to the trachea which is fairly easily found and dissected with blunt dissection a hemostat. An inferior base flap was created to remove the scar tissue. The endotracheal tube was removed and the site was still too small for a #6 Shiley to comfortably past even after gentle dilation. An endotracheal tube was placed into the stoma and then the inferior trachea was further dissected with a small incision midline made to open it up more and that was on left to allow the #6 Shiley to pass easily. There was good end tidal CO2 return. There was good hemostasis. The trach was secured with a 3-0 nylon and a Velcro trach collar. The patient was then examined with the glide scope to examine the larynx. Vocal cord still looked normal and the cord seemed open with manipulation indicating they were not mechanically scarred. The patient was then brought to intensive care unit in stable condition on the ventilator. Counts correct

## 2016-11-09 NOTE — Consult Note (Addendum)
[facility] for Infectious Disease  Date of Admission:  11/02/2016  Date of Consult:  11/09/2016  Reason for Consult: Staph bacteremia Referring Physician: CHAMP  Impression/Recommendation Staph bacteremia HCAP Steroid induced leukocytosis Prev CV surgery (MV bioprosthesis)  Would Stop cefepime at day 8 continue vanco add rifampin (LFs normal) Consider pulling her HD line TEE Await final of her BCx Repeat BCx in AM   Thank you so much for this interesting consult,   Bobby Rumpf (pager) [phone] [email or web address]  BRITANI BEATTIE is an 60 y.o. female.  HPI: 60 yo F with hx of admit to [facility] for STEMI, PCI. She developed shock and respiratory failure. She was transferred to 9-15. On 9-20 she underwent MVR with bioprosthesis. She developed septic shock afterwards.  She was able to be extubated by 9-29, required re-intubation on 9-30. She had trach performed 10-5. She was able to be decanulated by 10-25.  She was sent to In pt rehab on 11-8 and was d/c 11-29.  She returned on 11-30 with respiratory distress.  WBC 14.4.  She appeared to be doing well until 12-5 when she became tachycardic, anxious and tachypneic. She had BCx done at that time in addition to other labs.  She had a CXR showing a possible retrocardiac infiltrate. She was suspected to have HCAP.  She as started on vanco/cefepime on 12-6.  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   Past Medical History:  Diagnosis Date  . Diabetes mellitus   . Hyperlipidemia   . Hypertension   . MI, old   . Renal disorder    dialysls    Past Surgical History:  Procedure Laterality Date  . AV FISTULA PLACEMENT Left 10/02/2016   Procedure: INSERTION OF ARTERIOVENOUS (AV) GORE-TEX GRAFT ARM;  Surgeon: Waynetta Sandy, MD;  Location: [place];  Service: Vascular;  Laterality: Left;  . CARDIAC CATHETERIZATION N/A 08/15/2016   Procedure: Left Heart Cath and Coronary  Angiography;  Surgeon: Yolonda Kida, MD;  Location: [place] [facility];  Service: Cardiovascular;  Laterality: N/A;  . CARDIAC CATHETERIZATION N/A 08/15/2016   Procedure: Coronary Stent Intervention;  Surgeon: Yolonda Kida, MD;  Location: [place] [facility];  Service: Cardiovascular;  Laterality: N/A;  . CARDIAC CATHETERIZATION N/A 08/18/2016   Procedure: Right Heart Cath;  Surgeon: Jolaine Artist, MD;  Location: [place] [facility];  Service: Cardiovascular;  Laterality: N/A;  . CARDIAC CATHETERIZATION N/A 08/18/2016   Procedure: IABP Insertion;  Surgeon: Jolaine Artist, MD;  Location: [place] [facility];  Service: Cardiovascular;  Laterality: N/A;  . CARDIAC CATHETERIZATION Right 08/23/2016   Procedure: CENTRAL LINE INSERTION RIGHT SUBCLAVIAN;  Surgeon: Ivin Poot, MD;  Location: [place];  Service: Open Heart Surgery;  Laterality: Right;  . ENDOVEIN HARVEST OF GREATER SAPHENOUS VEIN Left 08/23/2016   Procedure: ENDOVEIN HARVEST OF GREATER SAPHENOUS VEIN;  Surgeon: Ivin Poot, MD;  Location: [place];  Service: Open Heart Surgery;  Laterality: Left;  . INSERTION OF DIALYSIS CATHETER Bilateral 08/31/2016   Procedure: INSERTION OF DIALYSIS CATHETER LEFT INTERNAL JUGULAR VEIN & INSERTION OF TRIPLE LUMEN RIGHT INTERNAL JUGULAR VEIN;  Surgeon: Angelia Mould, MD;  Location: [place];  Service: Vascular;  Laterality: Bilateral;  . INTRAOPERATIVE TRANSESOPHAGEAL ECHOCARDIOGRAM N/A 08/23/2016   Procedure: INTRAOPERATIVE TRANSESOPHAGEAL ECHOCARDIOGRAM;  Surgeon: Ivin Poot, MD;  Location: [place];  Service: Open Heart Surgery;  Laterality: N/A;  . MITRAL VALVE REPAIR N/A 08/23/2016  Procedure: MITRAL VALVE REPAIR (MVR) USING 25MM EDWARDS MAGNA EASE BIOPROSTHESIS MITRAL  VALVE;  Surgeon: Ivin Poot, MD;  Location: [place];  Service: Open Heart Surgery;  Laterality: N/A;  . tracheostomy reversal    . VAGINAL DELIVERY     x 6     No Known Allergies  Medications:   Scheduled: . [MAR Hold] amiodarone  200 mg Oral Daily  . [MAR Hold] atorvastatin  20 mg Oral q1800  . [MAR Hold] ceFEPime (MAXIPIME) IV  1 g Intravenous Q24H  . [MAR Hold] clonazePAM  0.5 mg Oral BID  . [MAR Hold] clopidogrel  75 mg Oral Daily  . [MAR Hold] darbepoetin (ARANESP) injection - DIALYSIS  60 mcg Intravenous Q Sat-HD  . [MAR Hold] feeding supplement (NEPRO CARB STEADY)  237 mL Oral BID BM  . [MAR Hold] ferric gluconate (FERRLECIT/NULECIT) IV  125 mg Intravenous Q T,Th,Sa-HD  . [MAR Hold] fluticasone  2 spray Each Nare Daily  . [MAR Hold] insulin aspart  0-15 Units Subcutaneous TID WC  . [MAR Hold] insulin aspart  0-5 Units Subcutaneous QHS  . [MAR Hold] insulin detemir  12 Units Subcutaneous Q2200  . [MAR Hold] insulin detemir  8 Units Subcutaneous Daily  . [MAR Hold] lanthanum  1,000 mg Oral TID WC  . [MAR Hold] loratadine  10 mg Oral Daily  . [MAR Hold] metoCLOPramide  5 mg Oral BID WC  . [MAR Hold] midodrine  10 mg Oral q morning - 10a  . [MAR Hold] multivitamin  1 tablet Oral QHS  . [MAR Hold] pantoprazole  40 mg Oral BID  . [MAR Hold] sodium chloride flush  3 mL Intravenous Q12H  . [MAR Hold] vancomycin  500 mg Intravenous Q T,Th,Sa-HD  . [MAR Hold] Warfarin - Pharmacist Dosing Inpatient   Does not apply q1800    Abtx:  Anti-infectives    Start     Dose/Rate Route Frequency Ordered Stop   11/09/16 1200  [MAR Hold]  vancomycin (VANCOCIN) 500 mg in sodium chloride 0.9 % 100 mL IVPB     (MAR Hold since 11/09/16 1449)   500 mg 100 mL/hr over 60 Minutes Intravenous Every T-Th-Sa (Hemodialysis) 11/08/16 0919     11/08/16 1000  [MAR Hold]  ceFEPIme (MAXIPIME) 1 g in dextrose 5 % 50 mL IVPB     (MAR Hold since 11/09/16 1449)   1 g 100 mL/hr over 30 Minutes Intravenous Every 24 hours 11/08/16 0919     11/08/16 0930  vancomycin (VANCOCIN) 1,250 mg in sodium chloride 0.9 % 250 mL IVPB     1,250 mg 166.7 mL/hr over 90 Minutes Intravenous  Once 11/08/16 0919 11/08/16 1415       Total days of antibiotics: 2 vanco/cefepime          Social History:  reports that she has never smoked. She has never used smokeless tobacco. She reports that she does not drink alcohol or use drugs.  Family History  Problem Relation Age of Onset  . Hypertension Mother   . Diabetes Mother   . Breast cancer Sister     General ROS: pt sedated in PACU  Blood pressure 134/69, pulse (!) 108, temperature 98.5 F (36.9 C), temperature source Oral, resp. rate (!) 34, height 5' 2"  (1.575 m), weight 52.9 kg (116 lb 10 oz), SpO2 97 %. General appearance: no distress Eyes: negative findings: pupils = Neck: trach site clean Lungs: rhonchi anterior - bilateral Heart: tachycardia Abdomen: abnormal findings:  hypoactive bowel  sounds and nontender Extremities: no LE wounds. LUE shows AVF with +bruit. clean.   L upper chest HD line- no d/c.    Results for orders placed or performed during the hospital encounter of 11/02/16 (from the past 48 hour(s))  Glucose, capillary     Status: Abnormal   Collection Time: 11/07/16  4:28 PM  Result Value Ref Range   Glucose-Capillary 125 (H) 65 - 99 mg/dL  Blood gas, arterial     Status: Abnormal   Collection Time: 11/07/16  6:41 PM  Result Value Ref Range   O2 Content 2.0 L/min   Delivery systems NASAL CANNULA    pH, Arterial 7.522 (H) 7.350 - 7.450   pCO2 arterial 33.2 32.0 - 48.0 mmHg   pO2, Arterial 58.6 (L) 83.0 - 108.0 mmHg   Bicarbonate 27.1 20.0 - 28.0 mmol/L   Acid-Base Excess 4.1 (H) 0.0 - 2.0 mmol/L   O2 Saturation 91.2 %   Patient temperature 98.6    Collection site RIGHT RADIAL    Drawn by [id number]    Sample type ARTERIAL DRAW    Allens test (pass/fail) PASS PASS  Glucose, capillary     Status: Abnormal   Collection Time: 11/07/16  6:54 PM  Result Value Ref Range   Glucose-Capillary 158 (H) 65 - 99 mg/dL  Troponin I     Status: Abnormal   Collection Time: 11/07/16  7:02 PM  Result Value Ref Range   Troponin I 0.07 (HH) <0.03  ng/mL    Comment: CRITICAL RESULT CALLED TO, READ BACK BY AND VERIFIED WITH: Bethann Humble RN [phone] 2032 GREEN R   CBC     Status: Abnormal   Collection Time: 11/07/16  7:02 PM  Result Value Ref Range   WBC 27.1 (H) 4.0 - 10.5 K/uL   RBC 4.84 3.87 - 5.11 MIL/uL   Hemoglobin 13.7 12.0 - 15.0 g/dL   HCT 42.0 36.0 - 46.0 %   MCV 86.8 78.0 - 100.0 fL   MCH 28.3 26.0 - 34.0 pg   MCHC 32.6 30.0 - 36.0 g/dL   RDW 19.4 (H) 11.5 - 15.5 %   Platelets 462 (H) 150 - 400 K/uL  Comprehensive metabolic panel     Status: Abnormal   Collection Time: 11/07/16  7:02 PM  Result Value Ref Range   Sodium 134 (L) 135 - 145 mmol/L   Potassium 3.2 (L) 3.5 - 5.1 mmol/L    Comment: DELTA CHECK NOTED   Chloride 93 (L) 101 - 111 mmol/L   CO2 25 22 - 32 mmol/L   Glucose, Bld 147 (H) 65 - 99 mg/dL   BUN 20 6 - 20 mg/dL   Creatinine, Ser 3.16 (H) 0.44 - 1.00 mg/dL    Comment: DIALYSIS   Calcium 9.2 8.9 - 10.3 mg/dL   Total Protein 7.5 6.5 - 8.1 g/dL   Albumin 3.1 (L) 3.5 - 5.0 g/dL   AST 22 15 - 41 U/L   ALT 21 14 - 54 U/L   Alkaline Phosphatase 69 38 - 126 U/L   Total Bilirubin 0.9 0.3 - 1.2 mg/dL   GFR calc non Af Amer 15 (L) >60 mL/min   GFR calc Af Amer 17 (L) >60 mL/min    Comment: (NOTE) The eGFR has been calculated using the CKD EPI equation. This calculation has not been validated in all clinical situations. eGFR's persistently <60 mL/min signify possible Chronic Kidney Disease.    Anion gap 16 (H) 5 - 15  Protime-INR  Status: Abnormal   Collection Time: 11/07/16  7:02 PM  Result Value Ref Range   Prothrombin Time 21.2 (H) 11.4 - 15.2 seconds   INR 1.80   Culture, blood (Routine X 2) w Reflex to ID Panel     Status: None (Preliminary result)   Collection Time: 11/07/16  7:02 PM  Result Value Ref Range   Specimen Description BLOOD RIGHT ANTECUBITAL    Special Requests IN PEDIATRIC BOTTLE 2CC    Culture  Setup Time      AEROBIC BOTTLE ONLY GRAM POSITIVE COCCI Organism ID to  follow CRITICAL RESULT CALLED TO, READ BACK BY AND VERIFIED WITH: JAMES LEDFORD, PHARMD @2343  11/08/16 MKELLY,MLT    Culture      GRAM POSITIVE COCCI CULTURE REINCUBATED FOR BETTER GROWTH    Report Status PENDING   Blood Culture ID Panel (Reflexed)     Status: Abnormal   Collection Time: 11/07/16  7:02 PM  Result Value Ref Range   Enterococcus species NOT DETECTED NOT DETECTED   Listeria monocytogenes NOT DETECTED NOT DETECTED   Staphylococcus species DETECTED (A) NOT DETECTED    Comment: CRITICAL RESULT CALLED TO, READ BACK BY AND VERIFIED WITH: JAMES LEDFORD, PHARMD @2343  11/08/16 MKELLY,MLT    Staphylococcus aureus DETECTED (A) NOT DETECTED    Comment: CRITICAL RESULT CALLED TO, READ BACK BY AND VERIFIED WITH: JAMES LEDFORD, PHARMD @2343  11/08/16 MKELLY,MLT    Methicillin resistance NOT DETECTED NOT DETECTED   Streptococcus species NOT DETECTED NOT DETECTED   Streptococcus agalactiae NOT DETECTED NOT DETECTED   Streptococcus pneumoniae NOT DETECTED NOT DETECTED   Streptococcus pyogenes NOT DETECTED NOT DETECTED   Acinetobacter baumannii NOT DETECTED NOT DETECTED   Enterobacteriaceae species NOT DETECTED NOT DETECTED   Enterobacter cloacae complex NOT DETECTED NOT DETECTED   Escherichia coli NOT DETECTED NOT DETECTED   Klebsiella oxytoca NOT DETECTED NOT DETECTED   Klebsiella pneumoniae NOT DETECTED NOT DETECTED   Proteus species NOT DETECTED NOT DETECTED   Serratia marcescens NOT DETECTED NOT DETECTED   Haemophilus influenzae NOT DETECTED NOT DETECTED   Neisseria meningitidis NOT DETECTED NOT DETECTED   Pseudomonas aeruginosa NOT DETECTED NOT DETECTED   Candida albicans NOT DETECTED NOT DETECTED   Candida glabrata NOT DETECTED NOT DETECTED   Candida krusei NOT DETECTED NOT DETECTED   Candida parapsilosis NOT DETECTED NOT DETECTED   Candida tropicalis NOT DETECTED NOT DETECTED  Culture, blood (Routine X 2) w Reflex to ID Panel     Status: None (Preliminary result)    Collection Time: 11/07/16  7:08 PM  Result Value Ref Range   Specimen Description BLOOD RIGHT HAND    Special Requests BOTTLES DRAWN AEROBIC ONLY 5CC    Culture  Setup Time      GRAM POSITIVE COCCI IN CLUSTERS AEROBIC BOTTLE ONLY CRITICAL VALUE NOTED.  VALUE IS CONSISTENT WITH PREVIOUSLY REPORTED AND CALLED VALUE.    Culture GRAM POSITIVE COCCI    Report Status PENDING   Glucose, capillary     Status: Abnormal   Collection Time: 11/07/16  9:13 PM  Result Value Ref Range   Glucose-Capillary 126 (H) 65 - 99 mg/dL  Heparin level (unfractionated)     Status: Abnormal   Collection Time: 11/08/16  4:33 AM  Result Value Ref Range   Heparin Unfractionated 1.08 (H) 0.30 - 0.70 IU/mL    Comment: RESULTS CONFIRMED BY MANUAL DILUTION        IF HEPARIN RESULTS ARE BELOW EXPECTED VALUES, AND PATIENT DOSAGE HAS BEEN CONFIRMED, SUGGEST  FOLLOW UP TESTING OF ANTITHROMBIN III LEVELS.   CBC     Status: Abnormal   Collection Time: 11/08/16  4:33 AM  Result Value Ref Range   WBC 30.5 (H) 4.0 - 10.5 K/uL    Comment: REPEATED TO VERIFY   RBC 4.39 3.87 - 5.11 MIL/uL   Hemoglobin 12.3 12.0 - 15.0 g/dL   HCT 38.4 36.0 - 46.0 %   MCV 87.5 78.0 - 100.0 fL   MCH 28.0 26.0 - 34.0 pg   MCHC 32.0 30.0 - 36.0 g/dL   RDW 19.8 (H) 11.5 - 15.5 %   Platelets 369 150 - 400 K/uL  Protime-INR     Status: Abnormal   Collection Time: 11/08/16  4:33 AM  Result Value Ref Range   Prothrombin Time 20.1 (H) 11.4 - 15.2 seconds   INR 1.70   Troponin I     Status: Abnormal   Collection Time: 11/08/16  4:33 AM  Result Value Ref Range   Troponin I 0.09 (HH) <0.03 ng/mL    Comment: CRITICAL VALUE NOTED.  VALUE IS CONSISTENT WITH PREVIOUSLY REPORTED AND CALLED VALUE.  Heparin level (unfractionated)     Status: Abnormal   Collection Time: 11/08/16  7:25 AM  Result Value Ref Range   Heparin Unfractionated 0.28 (L) 0.30 - 0.70 IU/mL    Comment:        IF HEPARIN RESULTS ARE BELOW EXPECTED VALUES, AND PATIENT DOSAGE  HAS BEEN CONFIRMED, SUGGEST FOLLOW UP TESTING OF ANTITHROMBIN III LEVELS.   Glucose, capillary     Status: Abnormal   Collection Time: 11/08/16  8:36 AM  Result Value Ref Range   Glucose-Capillary 152 (H) 65 - 99 mg/dL   Comment 1 Notify RN    Comment 2 Document in Chart   Glucose, capillary     Status: Abnormal   Collection Time: 11/08/16 11:19 AM  Result Value Ref Range   Glucose-Capillary 203 (H) 65 - 99 mg/dL   Comment 1 Notify RN    Comment 2 Document in Chart   Heparin level (unfractionated)     Status: None   Collection Time: 11/08/16  3:00 PM  Result Value Ref Range   Heparin Unfractionated 0.57 0.30 - 0.70 IU/mL    Comment:        IF HEPARIN RESULTS ARE BELOW EXPECTED VALUES, AND PATIENT DOSAGE HAS BEEN CONFIRMED, SUGGEST FOLLOW UP TESTING OF ANTITHROMBIN III LEVELS.   Glucose, capillary     Status: Abnormal   Collection Time: 11/08/16  4:43 PM  Result Value Ref Range   Glucose-Capillary 170 (H) 65 - 99 mg/dL   Comment 1 Notify RN    Comment 2 Document in Chart   Glucose, capillary     Status: Abnormal   Collection Time: 11/08/16  8:56 PM  Result Value Ref Range   Glucose-Capillary 109 (H) 65 - 99 mg/dL   Comment 1 Notify RN   Heparin level (unfractionated)     Status: None   Collection Time: 11/09/16  4:21 AM  Result Value Ref Range   Heparin Unfractionated 0.40 0.30 - 0.70 IU/mL    Comment:        IF HEPARIN RESULTS ARE BELOW EXPECTED VALUES, AND PATIENT DOSAGE HAS BEEN CONFIRMED, SUGGEST FOLLOW UP TESTING OF ANTITHROMBIN III LEVELS.   CBC     Status: Abnormal   Collection Time: 11/09/16  4:21 AM  Result Value Ref Range   WBC 24.5 (H) 4.0 - 10.5 K/uL   RBC 4.29 3.87 -  5.11 MIL/uL   Hemoglobin 12.0 12.0 - 15.0 g/dL   HCT 37.9 36.0 - 46.0 %   MCV 88.3 78.0 - 100.0 fL   MCH 28.0 26.0 - 34.0 pg   MCHC 31.7 30.0 - 36.0 g/dL   RDW 19.3 (H) 11.5 - 15.5 %   Platelets 350 150 - 400 K/uL  Protime-INR     Status: Abnormal   Collection Time: 11/09/16  4:21  AM  Result Value Ref Range   Prothrombin Time 17.4 (H) 11.4 - 15.2 seconds   INR 1.41   Glucose, capillary     Status: Abnormal   Collection Time: 11/09/16  7:54 AM  Result Value Ref Range   Glucose-Capillary 107 (H) 65 - 99 mg/dL   Comment 1 Notify RN    Comment 2 Document in Chart   Glucose, capillary     Status: Abnormal   Collection Time: 11/09/16  1:09 PM  Result Value Ref Range   Glucose-Capillary 112 (H) 65 - 99 mg/dL   Comment 1 Notify RN    Comment 2 Document in Chart       Component Value Date/Time   SDES BLOOD RIGHT HAND 11/07/2016 1908   SPECREQUEST BOTTLES DRAWN AEROBIC ONLY 5CC 11/07/2016 1908   CULT GRAM POSITIVE COCCI 11/07/2016 1908   REPTSTATUS PENDING 11/07/2016 1908   Dg Chest 2 View  Result Date: 11/08/2016 CLINICAL DATA:  Short of breath, hypertension, diabetes EXAM: CHEST  2 VIEW COMPARISON:  Portable chest x-ray of 11/07/2016 FINDINGS: Opacity remains at the left arm right lung base with apparent air bronchograms most consistent with left lower lobe pneumonia. Mild atelectasis at the right lung base remains. No definite pleural effusion is seen. Cardiomegaly is stable. Left central venous line is unchanged in position. Mitral valve replacement is present. IMPRESSION: 1. Persistent left lower lobe opacity most suspicious for pneumonia. 2. Stable cardiomegaly with central venous line. 3. Right basilar linear atelectasis. Electronically Signed   By: Ivar Drape M.D.   On: 11/08/2016 08:39   Dg Chest Port 1 View  Result Date: 11/07/2016 CLINICAL DATA:  Dyspnea, hypertension, myocardial infarction and diabetes. EXAM: PORTABLE CHEST 1 VIEW COMPARISON:  11/04/2016 FINDINGS: There is stable mild cardiomegaly. The patient is status post mitral valvular replacement. Median sternotomy sutures are noted. Dialysis catheter from left IJ approach is noted with tip at the cavoatrial junction. No pneumothorax. Retrocardiac opacities are seen suspicious for left lower lobe  pneumonia/atelectasis and probable small layering effusion. IMPRESSION: Retrocardiac opacity suspicious for pneumonia and/or atelectasis. Small layering left effusion is not entirely excluded. Otherwise no significant change. Electronically Signed   By: Ashley Royalty M.D.   On: 11/07/2016 20:54   Recent Results (from the past 240 hour(s))  Urine culture     Status: Abnormal   Collection Time: 10/30/16  9:19 PM  Result Value Ref Range Status   Specimen Description URINE, CLEAN CATCH  Final   Special Requests NONE  Final   Culture MULTIPLE SPECIES PRESENT, SUGGEST RECOLLECTION (A)  Final   Report Status 11/01/2016 FINAL  Final  MRSA PCR Screening     Status: Abnormal   Collection Time: 11/03/16  5:42 AM  Result Value Ref Range Status   MRSA by PCR POSITIVE (A) NEGATIVE Final    Comment:        The GeneXpert MRSA Assay (FDA approved for NASAL specimens only), is one component of a comprehensive MRSA colonization surveillance program. It is not intended to diagnose MRSA infection nor to  guide or monitor treatment for MRSA infections. RESULT CALLED TO, READ BACK BY AND VERIFIED WITH: M.SCHMIT RN AT [id number] 11/03/16 BY A.DAVIS   Culture, blood (Routine X 2) w Reflex to ID Panel     Status: None (Preliminary result)   Collection Time: 11/07/16  7:02 PM  Result Value Ref Range Status   Specimen Description BLOOD RIGHT ANTECUBITAL  Final   Special Requests IN PEDIATRIC BOTTLE 2CC  Final   Culture  Setup Time   Final    AEROBIC BOTTLE ONLY GRAM POSITIVE COCCI Organism ID to follow CRITICAL RESULT CALLED TO, READ BACK BY AND VERIFIED WITH: Narda Bonds, PHARMD @2343  11/08/16 MKELLY,MLT    Culture   Final    GRAM POSITIVE COCCI CULTURE REINCUBATED FOR BETTER GROWTH    Report Status PENDING  Incomplete  Blood Culture ID Panel (Reflexed)     Status: Abnormal   Collection Time: 11/07/16  7:02 PM  Result Value Ref Range Status   Enterococcus species NOT DETECTED NOT DETECTED Final    Listeria monocytogenes NOT DETECTED NOT DETECTED Final   Staphylococcus species DETECTED (A) NOT DETECTED Final    Comment: CRITICAL RESULT CALLED TO, READ BACK BY AND VERIFIED WITH: JAMES LEDFORD, PHARMD @2343  11/08/16 MKELLY,MLT    Staphylococcus aureus DETECTED (A) NOT DETECTED Final    Comment: CRITICAL RESULT CALLED TO, READ BACK BY AND VERIFIED WITH: JAMES LEDFORD, PHARMD @2343  11/08/16 MKELLY,MLT    Methicillin resistance NOT DETECTED NOT DETECTED Final   Streptococcus species NOT DETECTED NOT DETECTED Final   Streptococcus agalactiae NOT DETECTED NOT DETECTED Final   Streptococcus pneumoniae NOT DETECTED NOT DETECTED Final   Streptococcus pyogenes NOT DETECTED NOT DETECTED Final   Acinetobacter baumannii NOT DETECTED NOT DETECTED Final   Enterobacteriaceae species NOT DETECTED NOT DETECTED Final   Enterobacter cloacae complex NOT DETECTED NOT DETECTED Final   Escherichia coli NOT DETECTED NOT DETECTED Final   Klebsiella oxytoca NOT DETECTED NOT DETECTED Final   Klebsiella pneumoniae NOT DETECTED NOT DETECTED Final   Proteus species NOT DETECTED NOT DETECTED Final   Serratia marcescens NOT DETECTED NOT DETECTED Final   Haemophilus influenzae NOT DETECTED NOT DETECTED Final   Neisseria meningitidis NOT DETECTED NOT DETECTED Final   Pseudomonas aeruginosa NOT DETECTED NOT DETECTED Final   Candida albicans NOT DETECTED NOT DETECTED Final   Candida glabrata NOT DETECTED NOT DETECTED Final   Candida krusei NOT DETECTED NOT DETECTED Final   Candida parapsilosis NOT DETECTED NOT DETECTED Final   Candida tropicalis NOT DETECTED NOT DETECTED Final  Culture, blood (Routine X 2) w Reflex to ID Panel     Status: None (Preliminary result)   Collection Time: 11/07/16  7:08 PM  Result Value Ref Range Status   Specimen Description BLOOD RIGHT HAND  Final   Special Requests BOTTLES DRAWN AEROBIC ONLY 5CC  Final   Culture  Setup Time   Final    GRAM POSITIVE COCCI IN CLUSTERS AEROBIC BOTTLE  ONLY CRITICAL VALUE NOTED.  VALUE IS CONSISTENT WITH PREVIOUSLY REPORTED AND CALLED VALUE.    Culture GRAM POSITIVE COCCI  Final   Report Status PENDING  Incomplete      11/09/2016, 2:50 PM     LOS: 7 days    Records and images were personally reviewed where available.

## 2016-11-09 NOTE — Progress Notes (Signed)
RT NOTE:   NIF/VC not collected. Pt sleeping when RT arrived. Will attempt in the morning. Pt scheduled 12/07 for elective trach. Will assess need for NIF/VC at that time.

## 2016-11-09 NOTE — Progress Notes (Signed)
Pt transported from PACU to 2M08 without incident.

## 2016-11-09 NOTE — Anesthesia Postprocedure Evaluation (Signed)
Anesthesia Post Note  Patient: Deborah [name]  Procedure(s) Performed: Procedure(s) (LRB): TRACHEOSTOMY (N/A)  Patient location during evaluation: PACU Anesthesia Type: General Level of consciousness: awake and alert Pain management: pain level controlled Vital Signs Assessment: post-procedure vital signs reviewed and stable Respiratory status: spontaneous breathing, respiratory function stable and patient on ventilator - see flowsheet for VS (On ventilator via trach) Cardiovascular status: blood pressure returned to baseline and stable Postop Assessment: no signs of nausea or vomiting Anesthetic complications: no    Last Vitals:  Vitals:   11/09/16 1536 11/09/16 1541  BP:  (!) 144/79  Pulse:  93  Resp:  18  Temp: 36.8 C     Last Pain:  Vitals:   11/09/16 1310  TempSrc:   PainSc: 0-No pain                 Tiajuana Amass

## 2016-11-09 NOTE — Progress Notes (Signed)
[facility] Progress Note Patient Name: Deborah [name] DOB: 04-Jul-1956 MRN: [id number]   Date of Service  11/09/2016  HPI/Events of Note  Renal pt hypotensive p trach   eICU Interventions  Dopamine titration      Intervention Category Major Interventions: Shock - evaluation and management  Christinia Gully 11/09/2016, 6:01 PM

## 2016-11-09 NOTE — Progress Notes (Signed)
ABG collected  

## 2016-11-09 NOTE — Progress Notes (Signed)
Patient arrived on unit on ventilator with heparin and precedex gtt. Bp is 69/51, HR 37. MD notified of soft bp. RN notified E-link for bp support. Awaiting order.

## 2016-11-09 NOTE — Progress Notes (Signed)
RN in room to get patient ready for dialysis/surgery while pt's son and daughter were present in room. After leaving the room, the patient's daughter approached RN very aggressively and began questioning why there was a camera in the room and why they were "spying on her mother" and stating "that's an invasion of my mother's privacy." RN attempted to explain to the daughter the camera is [facility] and they're critical care nurses that check in on the patient periodically and monitor for complications/issues. I also explained they usually introduce themselves immediately upon turning on the camera, which they apparently did not do this time. I also explained I was unaware the camera was turned on while I was in the room or I would have explained it to her while in the room. Daughter was very rude and spoke aggressively towards RN throughout the conversation. After having this conversation, this RN telephoned [facility] and spoke with Dwana Melena who stated she was going to introduce herself, but saw the RN in the room, and turned the camera off. Will continue to monitor the situation.  Joellen Jersey, RN.

## 2016-11-09 NOTE — Transfer of Care (Signed)
Immediate Anesthesia Transfer of Care Note  Patient: Deborah [name]  Procedure(s) Performed: Procedure(s): TRACHEOSTOMY (N/A)  Patient Location: PACU  Anesthesia Type:General  Level of Consciousness: Patient remains intubated per anesthesia plan  Airway & Oxygen Therapy: Patient remains intubated per anesthesia plan and Patient placed on Ventilator (see vital sign flow sheet for setting)  Post-op Assessment: Report given to RN and Post -op Vital signs reviewed and stable  Post vital signs: Reviewed and stable  Last Vitals:  Vitals:   11/09/16 1100 11/09/16 1130  BP: 108/63 134/69  Pulse: (!) 110 (!) 108  Resp: (!) 30 (!) 34  Temp:      Last Pain:  Vitals:   11/09/16 1310  TempSrc:   PainSc: 0-No pain         Complications: No apparent anesthesia complications

## 2016-11-10 ENCOUNTER — Inpatient Hospital Stay (HOSPITAL_COMMUNITY): Payer: Medicaid Other

## 2016-11-10 ENCOUNTER — Encounter (HOSPITAL_COMMUNITY): Payer: Self-pay | Admitting: Otolaryngology

## 2016-11-10 DIAGNOSIS — J9509 Other tracheostomy complication: Secondary | ICD-10-CM

## 2016-11-10 DIAGNOSIS — T380X5A Adverse effect of glucocorticoids and synthetic analogues, initial encounter: Secondary | ICD-10-CM

## 2016-11-10 DIAGNOSIS — Y838 Other surgical procedures as the cause of abnormal reaction of the patient, or of later complication, without mention of misadventure at the time of the procedure: Secondary | ICD-10-CM

## 2016-11-10 DIAGNOSIS — J3802 Paralysis of vocal cords and larynx, bilateral: Secondary | ICD-10-CM

## 2016-11-10 DIAGNOSIS — Z9889 Other specified postprocedural states: Secondary | ICD-10-CM

## 2016-11-10 DIAGNOSIS — R7881 Bacteremia: Secondary | ICD-10-CM | POA: Insufficient documentation

## 2016-11-10 DIAGNOSIS — J3800 Paralysis of vocal cords and larynx, unspecified: Secondary | ICD-10-CM

## 2016-11-10 DIAGNOSIS — D638 Anemia in other chronic diseases classified elsewhere: Secondary | ICD-10-CM

## 2016-11-10 DIAGNOSIS — Z7952 Long term (current) use of systemic steroids: Secondary | ICD-10-CM

## 2016-11-10 LAB — CBC
HCT: 36.1 % (ref 36.0–46.0)
Hemoglobin: 11.5 g/dL — ABNORMAL LOW (ref 12.0–15.0)
MCH: 28.1 pg (ref 26.0–34.0)
MCHC: 31.9 g/dL (ref 30.0–36.0)
MCV: 88.3 fL (ref 78.0–100.0)
PLATELETS: 298 10*3/uL (ref 150–400)
RBC: 4.09 MIL/uL (ref 3.87–5.11)
RDW: 19.1 % — ABNORMAL HIGH (ref 11.5–15.5)
WBC: 16.9 10*3/uL — AB (ref 4.0–10.5)

## 2016-11-10 LAB — PROTIME-INR
INR: 1.33
PROTHROMBIN TIME: 16.5 s — AB (ref 11.4–15.2)

## 2016-11-10 LAB — GLUCOSE, CAPILLARY
GLUCOSE-CAPILLARY: 133 mg/dL — AB (ref 65–99)
GLUCOSE-CAPILLARY: 226 mg/dL — AB (ref 65–99)
GLUCOSE-CAPILLARY: 53 mg/dL — AB (ref 65–99)
GLUCOSE-CAPILLARY: 99 mg/dL (ref 65–99)
Glucose-Capillary: 151 mg/dL — ABNORMAL HIGH (ref 65–99)
Glucose-Capillary: 117 mg/dL — ABNORMAL HIGH (ref 65–99)
Glucose-Capillary: 47 mg/dL — ABNORMAL LOW (ref 65–99)
Glucose-Capillary: 58 mg/dL — ABNORMAL LOW (ref 65–99)
Glucose-Capillary: 96 mg/dL (ref 65–99)

## 2016-11-10 LAB — HEPARIN LEVEL (UNFRACTIONATED): HEPARIN UNFRACTIONATED: 0.31 [IU]/mL (ref 0.30–0.70)

## 2016-11-10 MED ORDER — VANCOMYCIN HCL 500 MG IV SOLR
500.0000 mg | Freq: Once | INTRAVENOUS | Status: AC
  Administered 2016-11-10: 500 mg via INTRAVENOUS
  Filled 2016-11-10: qty 500

## 2016-11-10 MED ORDER — MIDAZOLAM HCL 2 MG/2ML IJ SOLN: 2.0000 mg | INTRAMUSCULAR | Status: DC | PRN

## 2016-11-10 MED ORDER — PANTOPRAZOLE SODIUM 40 MG PO PACK
40.0000 mg | PACK | Freq: Every day | ORAL | Status: DC
  Administered 2016-11-10 – 2016-11-26 (×17): 40 mg
  Filled 2016-11-10 (×17): qty 20

## 2016-11-10 MED ORDER — AMIODARONE HCL 200 MG PO TABS
200.0000 mg | ORAL_TABLET | Freq: Every day | ORAL | Status: DC
  Administered 2016-11-11 – 2016-11-26 (×16): 200 mg
  Filled 2016-11-10 (×17): qty 1

## 2016-11-10 MED ORDER — CLOPIDOGREL BISULFATE 75 MG PO TABS
75.0000 mg | ORAL_TABLET | Freq: Every day | ORAL | Status: DC
  Administered 2016-11-11 – 2016-11-26 (×16): 75 mg
  Filled 2016-11-10 (×16): qty 1

## 2016-11-10 MED ORDER — VITAL AF 1.2 CAL PO LIQD
1000.0000 mL | ORAL | Status: DC
  Administered 2016-11-10 – 2016-11-25 (×10): 1000 mL
  Filled 2016-11-10 (×17): qty 1000

## 2016-11-10 MED ORDER — DEXTROSE 50 % IV SOLN
50.0000 mL | Freq: Once | INTRAVENOUS | Status: AC
  Administered 2016-11-10: 50 mL via INTRAVENOUS
  Filled 2016-11-10: qty 50

## 2016-11-10 MED ORDER — ATORVASTATIN CALCIUM 20 MG PO TABS
20.0000 mg | ORAL_TABLET | Freq: Every day | ORAL | Status: DC
  Administered 2016-11-10 – 2016-11-25 (×15): 20 mg
  Filled 2016-11-10 (×16): qty 1

## 2016-11-10 MED ORDER — WARFARIN SODIUM 5 MG PO TABS: 5.0000 mg | ORAL_TABLET | Freq: Once | ORAL | Status: DC

## 2016-11-10 MED ORDER — CLONAZEPAM 0.5 MG PO TABS
0.5000 mg | ORAL_TABLET | Freq: Two times a day (BID) | ORAL | Status: DC
  Administered 2016-11-10 – 2016-11-26 (×32): 0.5 mg
  Filled 2016-11-10 (×32): qty 1

## 2016-11-10 MED ORDER — FLUTICASONE PROPIONATE 50 MCG/ACT NA SUSP
2.0000 | NASAL | Status: DC | PRN
  Filled 2016-11-10: qty 16

## 2016-11-10 MED ORDER — METOCLOPRAMIDE HCL 5 MG/5ML PO SOLN
10.0000 mg | Freq: Two times a day (BID) | ORAL | Status: DC
  Administered 2016-11-10: 10 mg via ORAL
  Filled 2016-11-10: qty 10

## 2016-11-10 MED ORDER — FENTANYL CITRATE (PF) 100 MCG/2ML IJ SOLN
100.0000 ug | INTRAMUSCULAR | Status: DC | PRN
  Administered 2016-11-10: 100 ug via INTRAVENOUS

## 2016-11-10 MED ORDER — DEXTROSE 50 % IV SOLN
INTRAVENOUS | Status: AC
  Filled 2016-11-10: qty 50

## 2016-11-10 MED ORDER — SODIUM CHLORIDE 0.9 % IV BOLUS (SEPSIS)
1000.0000 mL | Freq: Once | INTRAVENOUS | Status: AC
  Administered 2016-11-10: 1000 mL via INTRAVENOUS

## 2016-11-10 MED ORDER — METOCLOPRAMIDE HCL 10 MG/10ML PO SOLN
10.0000 mg | Freq: Two times a day (BID) | ORAL | Status: DC
  Administered 2016-11-10 – 2016-11-26 (×31): 10 mg
  Filled 2016-11-10 (×32): qty 10

## 2016-11-10 MED ORDER — VITAL HIGH PROTEIN PO LIQD: 1000.0000 mL | ORAL | Status: DC

## 2016-11-10 MED ORDER — RIFAMPIN ORAL SUSPENSION 25 MG/ML
300.0000 mg | Freq: Three times a day (TID) | ORAL | Status: DC
  Administered 2016-11-10 – 2016-11-26 (×44): 300 mg
  Filled 2016-11-10 (×58): qty 5

## 2016-11-10 MED ORDER — FENTANYL CITRATE (PF) 100 MCG/2ML IJ SOLN
25.0000 ug | INTRAMUSCULAR | Status: DC | PRN
  Administered 2016-11-10 – 2016-11-12 (×11): 100 ug via INTRAVENOUS
  Administered 2016-11-13 – 2016-11-14 (×3): 50 ug via INTRAVENOUS
  Administered 2016-11-15: 100 ug via INTRAVENOUS
  Filled 2016-11-10 (×15): qty 2

## 2016-11-10 MED ORDER — MIDODRINE HCL 5 MG PO TABS
10.0000 mg | ORAL_TABLET | Freq: Every morning | ORAL | Status: DC
  Administered 2016-11-11 – 2016-11-26 (×16): 10 mg
  Filled 2016-11-10 (×16): qty 2

## 2016-11-10 MED ORDER — FENTANYL CITRATE (PF) 100 MCG/2ML IJ SOLN
100.0000 ug | INTRAMUSCULAR | Status: DC | PRN
Start: 2016-11-10 — End: 2016-11-10
  Filled 2016-11-10: qty 2

## 2016-11-10 NOTE — Care Management Note (Signed)
Case Management Note  Patient Details  Name: Deborah [name] MRN: [id number] Date of Birth: 02-03-1956  Subjective/Objective:  12/1- presents with recent prolonged hospitalization secondary to papillary muscle rupture and subsequent HCAP. She was intubated requiring trach. Decannulated in the end of October. She then went to [facility] and was discharged 11/29. [facility]30 with compalints of SOB x1-2 weeks    12/ 6 1645 NCM spoke with son [name] [phone] he was in the room, he states when patient came home from CIR in a w/chair she still could not walk and she will most likely need to go to SNF to get rehab after dc.  Patient has a rolling walker at home and a w/chair and a bsc.  She is for tracheostomy 12/7. Will need pt eval after tracheostomy.  NCM will cont to follow for dc needs.                        Action/Plan:   Expected Discharge Date:                  Expected Discharge Plan:  Skilled Nursing Facility  In-House Referral:  Clinical Social Work  Discharge planning Services  CM Consult  Post Acute Care Choice:    Choice offered to:     DME Arranged:    DME Agency:     HH Arranged:    [place] Agency:     Status of Service:  In process, will continue to follow  If discussed at Long Length of Stay Meetings, dates discussed:    Additional Comments: 11/10/2016 Pt transferred to ICU 11/09/16.  Pt is being considered for PEG tube.  Pt remains on the ventilator via fresh trach.  Pt is not an LTACH candidate due to lack of benefit.  PT will assess pt when she is medically stable - CSW made aware of family request above.  CM will continue to follow for discharge needs Maryclare Labrador, RN 11/10/2016, 2:50 PM

## 2016-11-10 NOTE — Progress Notes (Signed)
ANTICOAGULATION CONSULT NOTE   Pharmacy Consult for heparin Indication: atrial fibrillation  No Known Allergies  Patient Measurements: Height: 5\' 2"  (157.5 cm) Weight: 117 lb 1 oz (53.1 kg) IBW/kg (Calculated) : 50.1 Heparin Dosing Weight: 55 kg  Vital Signs: Temp: 98.4 F (36.9 C) (12/08 1141) Temp Source: Oral (12/08 1141) BP: 137/63 (12/08 1300) Pulse Rate: 61 (12/08 1300)  Labs:  Recent Labs  11/07/16 1902 11/08/16 0433  11/08/16 1500 11/09/16 0421 11/09/16 1426 11/09/16 1801 11/10/16 0216  HGB 13.7 12.3  --   --  12.0 13.9 11.1* 11.5*  HCT 42.0 38.4  --   --  37.9 41.0 35.3* 36.1  PLT 462* 369  --   --  350  --  305 298  LABPROT 21.2* 20.1*  --   --  17.4*  --   --  16.5*  INR 1.80 1.70  --   --  1.41  --   --  1.33  HEPARINUNFRC  --  1.08*  < > 0.57 0.40  --   --  0.31  CREATININE 3.16*  --   --   --   --   --  2.62*  --   TROPONINI 0.07* 0.09*  --   --   --   --   --   --   < > = values in this interval not displayed.  Estimated Creatinine Clearance: 18.3 mL/min (by C-G formula based on SCr of 2.62 mg/dL (H)).   Medical History: Past Medical History:  Diagnosis Date  . Diabetes mellitus   . Hyperlipidemia   . Hypertension   . MI, old   . Renal disorder    dialysls    Assessment: 23 yoF that remains on heparin infusion for atrial fibrillation while warfarin on hold. Heparin level remains therapeutic this morning at 0.31 but has trended down over last 48 hours. CBC stable and planning for gastrostomy tube placement on Sunday or Monday.    Goal of Therapy:  Heparin level 0.3-0.7 units/ml Monitor platelets by anticoagulation protocol: Yes   Plan:  1. Increase heparin infusion slightly to 950 units/hr  2. Continue daily heparin level  Vincenza Hews, PharmD, BCPS 11/10/2016, 1:22 PM Pager: [phone]

## 2016-11-10 NOTE — Progress Notes (Signed)
Patient ID: Deborah [name], female   DOB: 09-Jul-1956, 60 y.o.   MRN: [id number]   [facility] Progress Note   Assessment/ Plan:   1. 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 ESRD: Status post hemodialysis yesterday, no acute dialysis needs at this time, will order for hemodialysis again tomorrow 3. Anemia: Hemoglobin levels acceptable, continue to monitor status post tracheostomy for additional need to titrate ESA 4. CKD-MBD: Currently nothing by mouth while intubated, binders on hold, not on VDRA with suppressed PTH 5. Nutrition: Appears to be frail and gradually failing to thrive with malnutrition-continue nutritional supplementation 6. Hypertension: Blood pressures appear to well controlled, continue to monitor  Subjective:   Underwent tracheostomy yesterday-intubated and currently sedated    Objective:   BP (!) 114/59   Pulse 63   Temp 97.3 F (36.3 C) (Oral)   Resp (!) 27   Ht 5' 2"  (1.575 m)   Wt 53.1 kg (117 lb 1 oz)   SpO2 100%   BMI 21.41 kg/m   Physical Exam: SVX:[id number], sedated, unresponsive CVS: Pulse regular rhythm, normal rate, S1 and S2 normal Resp: Anteriorly clear to auscultation, no rales/rhonchi Abd: Soft, flat, nontender Ext: No lower extremity edema. Left upper arm AVG with good thrill/bruit  Labs: BMET  Recent Labs Lab 11/04/16 0358 11/07/16 1110 11/07/16 1902 11/09/16 1426 11/09/16 1801  NA 133* 128* 134* 135 135  K 3.4* 3.9 3.2* 4.1 4.2  CL 94* 91* 93*  --  100*  CO2 26 22 25   --  18*  GLUCOSE 151* 388* 147* 103* 139*  BUN 29* 70* 20  --  18  CREATININE 4.70* 7.04* 3.16*  --  2.62*  CALCIUM 9.1 9.0 9.2  --  8.6*  PHOS  --  3.7  --   --  3.5   CBC  Recent Labs Lab 11/08/16 0433 11/09/16 0421 11/09/16 1426 11/09/16 1801 11/10/16 0216  WBC 30.5* 24.5*  --  18.8* 16.9*  HGB 12.3 12.0 13.9 11.1* 11.5*  HCT 38.4  37.9 41.0 35.3* 36.1  MCV 87.5 88.3  --  88.7 88.3  PLT 369 350  --  305 298   Medications:    . amiodarone  200 mg Oral Daily  . atorvastatin  20 mg Oral q1800  . ceFEPime (MAXIPIME) IV  1 g Intravenous Q24H  . chlorhexidine gluconate (MEDLINE KIT)  15 mL Mouth Rinse BID  . clonazePAM  0.5 mg Oral BID  . clopidogrel  75 mg Oral Daily  . darbepoetin (ARANESP) injection - DIALYSIS  60 mcg Intravenous Q Sat-HD  . ferric gluconate (FERRLECIT/NULECIT) IV  125 mg Intravenous Q T,Th,Sa-HD  . insulin aspart  0-15 Units Subcutaneous Q4H  . mouth rinse  15 mL Mouth Rinse QID  . metoCLOPramide  10 mg Oral BID  . midodrine  10 mg Oral q morning - 10a  . multivitamin  1 tablet Oral QHS  . pantoprazole sodium  40 mg Per Tube Daily  . rifampin  300 mg Oral Q8H  . sodium chloride flush  3 mL Intravenous Q12H  . vancomycin  500 mg Intravenous Q T,Th,Sa-HD  . vancomycin  500 mg Intravenous Once  . warfarin  5 mg Oral ONCE-1800  . Warfarin - Pharmacist Dosing Inpatient   Does not apply Q3300   Elmarie Shiley, MD 11/10/2016, 8:41 AM

## 2016-11-10 NOTE — Progress Notes (Signed)
SLP Cancellation Note  Patient Details Name: Deborah [name] MRN: [id number] DOB: 12/19/1955   Cancelled treatment:       Reason Eval/Treat Not Completed: Patient not medically ready. Orders received for swallow evaluation. Per chart review, pt received trach on previous date with hopes for weaning this morning. Will hold swallow evaluation pending weaning attempts. Please consider PMV evaluation as well to optimize swallowing function.   Germain Osgood 11/10/2016, 9:24 AM  Germain Osgood, M.A. CCC-SLP [phone]

## 2016-11-10 NOTE — Consult Note (Signed)
Chief Complaint: dysphagia secondary to VDRF  Referring Physician:Dr. Tera Partridge  Supervising Physician: Corrie Mckusick  Patient Status: [facility] - In-pt  HPI: Deborah [name] is an 60 y.o. female who has an unfortunate recent stream of events including MI with PCI and subsequent cardiogenic shock with papillary muscle rupture requiring a MV replacement.  She had respiratory failure and was trached.  She recovered, was decannulated, and went to CIR and was eventually discharged home after a 2 month extended hospital stay.  Unfortunately she had to be readmitted secondary to respiratory failure from vocal cord paralysis.  She underwent a repeat tracheostomy yesterday.  We have been asked to see her in regards to needing a gastrostomy tube for feeding and medication access.  She is currently on plavix and a heparin drip in place of coumadin for her a. Fib.  Her coumadin is likely going to be discontinued permanently, but will remain on her heparin drip for now.  Her plavix will need to remain in place as she had two DES placed on mid-September secondary to her acute MI.  Past Medical History:  Past Medical History:  Diagnosis Date  . Diabetes mellitus   . Hyperlipidemia   . Hypertension   . MI, old   . Renal disorder    dialysls    Past Surgical History:  Past Surgical History:  Procedure Laterality Date  . AV FISTULA PLACEMENT Left 10/02/2016   Procedure: INSERTION OF ARTERIOVENOUS (AV) GORE-TEX GRAFT ARM;  Surgeon: Waynetta Sandy, MD;  Location: [place];  Service: Vascular;  Laterality: Left;  . CARDIAC CATHETERIZATION N/A 08/15/2016   Procedure: Left Heart Cath and Coronary Angiography;  Surgeon: Yolonda Kida, MD;  Location: [place] CV LAB;  Service: Cardiovascular;  Laterality: N/A;  . CARDIAC CATHETERIZATION N/A 08/15/2016   Procedure: Coronary Stent Intervention;  Surgeon: Yolonda Kida, MD;  Location: [place] CV LAB;  Service: Cardiovascular;  Laterality:  N/A;  . CARDIAC CATHETERIZATION N/A 08/18/2016   Procedure: Right Heart Cath;  Surgeon: Jolaine Artist, MD;  Location: [place] CV LAB;  Service: Cardiovascular;  Laterality: N/A;  . CARDIAC CATHETERIZATION N/A 08/18/2016   Procedure: IABP Insertion;  Surgeon: Jolaine Artist, MD;  Location: [place] CV LAB;  Service: Cardiovascular;  Laterality: N/A;  . CARDIAC CATHETERIZATION Right 08/23/2016   Procedure: CENTRAL LINE INSERTION RIGHT SUBCLAVIAN;  Surgeon: Ivin Poot, MD;  Location: [place];  Service: Open Heart Surgery;  Laterality: Right;  . ENDOVEIN HARVEST OF GREATER SAPHENOUS VEIN Left 08/23/2016   Procedure: ENDOVEIN HARVEST OF GREATER SAPHENOUS VEIN;  Surgeon: Ivin Poot, MD;  Location: [place];  Service: Open Heart Surgery;  Laterality: Left;  . INSERTION OF DIALYSIS CATHETER Bilateral 08/31/2016   Procedure: INSERTION OF DIALYSIS CATHETER LEFT INTERNAL JUGULAR VEIN & INSERTION OF TRIPLE LUMEN RIGHT INTERNAL JUGULAR VEIN;  Surgeon: Angelia Mould, MD;  Location: [place];  Service: Vascular;  Laterality: Bilateral;  . INTRAOPERATIVE TRANSESOPHAGEAL ECHOCARDIOGRAM N/A 08/23/2016   Procedure: INTRAOPERATIVE TRANSESOPHAGEAL ECHOCARDIOGRAM;  Surgeon: Ivin Poot, MD;  Location: [place];  Service: Open Heart Surgery;  Laterality: N/A;  . MITRAL VALVE REPAIR N/A 08/23/2016   Procedure: MITRAL VALVE REPAIR (MVR) USING 25MM EDWARDS MAGNA EASE BIOPROSTHESIS MITRAL  VALVE;  Surgeon: Ivin Poot, MD;  Location: [place];  Service: Open Heart Surgery;  Laterality: N/A;  . tracheostomy reversal    . TRACHEOSTOMY TUBE PLACEMENT N/A 11/09/2016   Procedure: TRACHEOSTOMY;  Surgeon: Melissa Montane,  MD;  Location: [facility];  Service: ENT;  Laterality: N/A;  . VAGINAL DELIVERY     x 6    Family History:  Family History  Problem Relation Age of Onset  . Hypertension Mother   . Diabetes Mother   . Breast cancer Sister     Social History:  reports that she has never smoked. She has never  used smokeless tobacco. She reports that she does not drink alcohol or use drugs.  Allergies: No Known Allergies  Medications: Medications reviewed in Epic  Please HPI for pertinent positives, otherwise complete 10 system ROS negative.  Mallampati Score: MD Evaluation Airway: Other (comments) Airway comments: trach in place Heart: WNL Abdomen: WNL Chest/ Lungs: Other (comments) Chest/ lungs comments: HCAP, vented ASA  Classification: 4  Physical Exam: BP (!) 122/58   Pulse 65   Temp 98.4 F (36.9 C) (Oral)   Resp (!) 32   Ht 5' 2"  (1.575 m)   Wt 117 lb 1 oz (53.1 kg)   SpO2 100%   BMI 21.41 kg/m  Body mass index is 21.41 kg/m. General: ill-appearing black female who is laying in bed in NAD secondary to sedation HEENT: head is normocephalic, atraumatic.  Eyes are closed and squeezes them together when trying to pull her eyelids open.  Ears and nose without any masses or lesions.  Lurline Idol is in place in midline of her neck Heart: regular, rate, and rhythm.  Normal s1,s2. Soft murmur. No obvious gallops, or rubs noted.  Palpable radial and pedal pulses bilaterally Lungs: coarse lung sounds bilaterally.  On vent with trach in place Abd: soft, NT, ND, +BS, no masses, hernias, or organomegaly Psych: unable to assess as she is sedated on the vent   Labs: Results for orders placed or performed during the hospital encounter of 11/02/16 (from the past 48 hour(s))  Heparin level (unfractionated)     Status: None   Collection Time: 11/08/16  3:00 PM  Result Value Ref Range   Heparin Unfractionated 0.57 0.30 - 0.70 IU/mL    Comment:        IF HEPARIN RESULTS ARE BELOW EXPECTED VALUES, AND PATIENT DOSAGE HAS BEEN CONFIRMED, SUGGEST FOLLOW UP TESTING OF ANTITHROMBIN III LEVELS.   Glucose, capillary     Status: Abnormal   Collection Time: 11/08/16  4:43 PM  Result Value Ref Range   Glucose-Capillary 170 (H) 65 - 99 mg/dL   Comment 1 Notify RN    Comment 2 Document in Chart     Glucose, capillary     Status: Abnormal   Collection Time: 11/08/16  8:56 PM  Result Value Ref Range   Glucose-Capillary 109 (H) 65 - 99 mg/dL   Comment 1 Notify RN   Heparin level (unfractionated)     Status: None   Collection Time: 11/09/16  4:21 AM  Result Value Ref Range   Heparin Unfractionated 0.40 0.30 - 0.70 IU/mL    Comment:        IF HEPARIN RESULTS ARE BELOW EXPECTED VALUES, AND PATIENT DOSAGE HAS BEEN CONFIRMED, SUGGEST FOLLOW UP TESTING OF ANTITHROMBIN III LEVELS.   CBC     Status: Abnormal   Collection Time: 11/09/16  4:21 AM  Result Value Ref Range   WBC 24.5 (H) 4.0 - 10.5 K/uL   RBC 4.29 3.87 - 5.11 MIL/uL   Hemoglobin 12.0 12.0 - 15.0 g/dL   HCT 37.9 36.0 - 46.0 %   MCV 88.3 78.0 - 100.0 fL   MCH 28.0  26.0 - 34.0 pg   MCHC 31.7 30.0 - 36.0 g/dL   RDW 19.3 (H) 11.5 - 15.5 %   Platelets 350 150 - 400 K/uL  Protime-INR     Status: Abnormal   Collection Time: 11/09/16  4:21 AM  Result Value Ref Range   Prothrombin Time 17.4 (H) 11.4 - 15.2 seconds   INR 1.41   Glucose, capillary     Status: Abnormal   Collection Time: 11/09/16  7:54 AM  Result Value Ref Range   Glucose-Capillary 107 (H) 65 - 99 mg/dL   Comment 1 Notify RN    Comment 2 Document in Chart   Glucose, capillary     Status: Abnormal   Collection Time: 11/09/16  1:09 PM  Result Value Ref Range   Glucose-Capillary 112 (H) 65 - 99 mg/dL   Comment 1 Notify RN    Comment 2 Document in Chart   I-STAT 4, (NA,K, GLUC, HGB,HCT)     Status: Abnormal   Collection Time: 11/09/16  2:26 PM  Result Value Ref Range   Sodium 135 135 - 145 mmol/L   Potassium 4.1 3.5 - 5.1 mmol/L   Glucose, Bld 103 (H) 65 - 99 mg/dL   HCT 41.0 36.0 - 46.0 %   Hemoglobin 13.9 12.0 - 15.0 g/dL  Glucose, capillary     Status: Abnormal   Collection Time: 11/09/16  4:31 PM  Result Value Ref Range   Glucose-Capillary 122 (H) 65 - 99 mg/dL  Renal function panel     Status: Abnormal   Collection Time: 11/09/16  6:01 PM   Result Value Ref Range   Sodium 135 135 - 145 mmol/L   Potassium 4.2 3.5 - 5.1 mmol/L   Chloride 100 (L) 101 - 111 mmol/L   CO2 18 (L) 22 - 32 mmol/L   Glucose, Bld 139 (H) 65 - 99 mg/dL   BUN 18 6 - 20 mg/dL   Creatinine, Ser 2.62 (H) 0.44 - 1.00 mg/dL   Calcium 8.6 (L) 8.9 - 10.3 mg/dL   Phosphorus 3.5 2.5 - 4.6 mg/dL   Albumin 2.8 (L) 3.5 - 5.0 g/dL   GFR calc non Af Amer 19 (L) >60 mL/min   GFR calc Af Amer 22 (L) >60 mL/min    Comment: (NOTE) The eGFR has been calculated using the CKD EPI equation. This calculation has not been validated in all clinical situations. eGFR's persistently <60 mL/min signify possible Chronic Kidney Disease.    Anion gap 17 (H) 5 - 15  CBC     Status: Abnormal   Collection Time: 11/09/16  6:01 PM  Result Value Ref Range   WBC 18.8 (H) 4.0 - 10.5 K/uL   RBC 3.98 3.87 - 5.11 MIL/uL   Hemoglobin 11.1 (L) 12.0 - 15.0 g/dL    Comment: REPEATED TO VERIFY DELTA CHECK NOTED    HCT 35.3 (L) 36.0 - 46.0 %   MCV 88.7 78.0 - 100.0 fL   MCH 27.9 26.0 - 34.0 pg   MCHC 31.4 30.0 - 36.0 g/dL   RDW 19.3 (H) 11.5 - 15.5 %   Platelets 305 150 - 400 K/uL  Culture, blood (Routine X 2) w Reflex to ID Panel     Status: None (Preliminary result)   Collection Time: 11/09/16  6:05 PM  Result Value Ref Range   Specimen Description BLOOD RIGHT HAND    Special Requests BOTTLES DRAWN AEROBIC ONLY 6CC    Culture NO GROWTH < 24 HOURS  Report Status PENDING   Glucose, capillary     Status: Abnormal   Collection Time: 11/09/16  8:34 PM  Result Value Ref Range   Glucose-Capillary 156 (H) 65 - 99 mg/dL   Comment 1 Notify RN    Comment 2 Document in Chart   Glucose, capillary     Status: Abnormal   Collection Time: 11/09/16 11:21 PM  Result Value Ref Range   Glucose-Capillary 193 (H) 65 - 99 mg/dL   Comment 1 Notify RN    Comment 2 Document in Chart   I-STAT 3, arterial blood gas (G3+)     Status: Abnormal   Collection Time: 11/09/16 11:46 PM  Result Value Ref  Range   pH, Arterial 7.386 7.350 - 7.450   pCO2 arterial 33.8 32.0 - 48.0 mmHg   pO2, Arterial 97.0 83.0 - 108.0 mmHg   Bicarbonate 20.0 20.0 - 28.0 mmol/L   TCO2 21 0 - 100 mmol/L   O2 Saturation 97.0 %   Acid-base deficit 4.0 (H) 0.0 - 2.0 mmol/L   Patient temperature 100.4 F    Sample type ARTERIAL   Protime-INR     Status: Abnormal   Collection Time: 11/10/16  2:16 AM  Result Value Ref Range   Prothrombin Time 16.5 (H) 11.4 - 15.2 seconds   INR 1.33   CBC     Status: Abnormal   Collection Time: 11/10/16  2:16 AM  Result Value Ref Range   WBC 16.9 (H) 4.0 - 10.5 K/uL   RBC 4.09 3.87 - 5.11 MIL/uL   Hemoglobin 11.5 (L) 12.0 - 15.0 g/dL   HCT 36.1 36.0 - 46.0 %   MCV 88.3 78.0 - 100.0 fL   MCH 28.1 26.0 - 34.0 pg   MCHC 31.9 30.0 - 36.0 g/dL   RDW 19.1 (H) 11.5 - 15.5 %   Platelets 298 150 - 400 K/uL  Heparin level (unfractionated)     Status: None   Collection Time: 11/10/16  2:16 AM  Result Value Ref Range   Heparin Unfractionated 0.31 0.30 - 0.70 IU/mL    Comment:        IF HEPARIN RESULTS ARE BELOW EXPECTED VALUES, AND PATIENT DOSAGE HAS BEEN CONFIRMED, SUGGEST FOLLOW UP TESTING OF ANTITHROMBIN III LEVELS.   Glucose, capillary     Status: None   Collection Time: 11/10/16  3:28 AM  Result Value Ref Range   Glucose-Capillary 99 65 - 99 mg/dL   Comment 1 Notify RN    Comment 2 Document in Chart   Glucose, capillary     Status: Abnormal   Collection Time: 11/10/16  7:48 AM  Result Value Ref Range   Glucose-Capillary 133 (H) 65 - 99 mg/dL   Comment 1 Notify RN    Comment 2 Document in Chart   Glucose, capillary     Status: Abnormal   Collection Time: 11/10/16 11:40 AM  Result Value Ref Range   Glucose-Capillary 151 (H) 65 - 99 mg/dL   Comment 1 Notify RN    Comment 2 Document in Chart     Imaging: Dg Chest Port 1 View  Result Date: 11/10/2016 CLINICAL DATA:  Vent trach pt sob assess lines and tubes EXAM: PORTABLE CHEST 1 VIEW COMPARISON:  11/09/2016  FINDINGS: Tracheostomy tube appears unchanged, approximately 6 cm above the carina. Left-sided dual lumen catheter tip overlies the level of the lower superior vena cava. Patient has had median sternotomy and valve replacement. A nasogastric tube is in place, tip off the  image beyond the gastroesophageal junction. Small amount of contrast is identified in the left upper quadrant of the abdomen. The heart is enlarged. There is minimal opacity at the left lung base which is similar in appearance to previous exams. No evidence for significant pulmonary edema. IMPRESSION: 1. Lines and tubes as described. 2. Persistent left lower lobe opacity. 3. Cardiomegaly without evidence for pulmonary edema. Electronically Signed   By: Nolon Nations M.D.   On: 11/10/2016 08:03   Dg Chest Port 1 View  Result Date: 11/09/2016 CLINICAL DATA:  Tracheostomy placement EXAM: PORTABLE CHEST 1 VIEW COMPARISON:  11/08/2016 FINDINGS: Heart size is top-normal. Retrocardiac opacity persists which may represent chronic atelectasis and/or infiltrate. No overt pulmonary edema. Patient status post mitral valvular replacement. Median sternotomy sutures are in place. Left IJ dialysis catheter tip is stable in at the cavoatrial junction. A tracheostomy tube tip is seen centered over the upper third of the trachea with tip approximately 4.8 cm above the carina. Gastric tube extends into the stomach. IMPRESSION: Satisfactory support line and tube positions. Chronic retrocardiac opacity which may reflect atelectasis and/or pneumonia. Electronically Signed   By: Ashley Royalty M.D.   On: 11/09/2016 19:11   Dg Abd Portable 1v  Result Date: 11/09/2016 CLINICAL DATA:  60 y/o  F; enteric tube placement. EXAM: PORTABLE ABDOMEN - 1 VIEW COMPARISON:  10/07/2016 abdominal radiograph. FINDINGS: Retained contrast in the colon. Enteric tube tip projects over the mid stomach. Partially visualized median sternotomy and valve replacement. Right central venous  catheter tip projects over cavoatrial junction. IMPRESSION: Enteric tube tip projects over mid stomach. Electronically Signed   By: Kristine Garbe M.D.   On: 11/09/2016 19:00    Assessment/Plan 1. Dysphagia secondary to VDRF A request has been made for a gastrostomy tube.  We will await the family being made aware of this by the primary service then we will plan to consent the family for this procedure.  She is currently on heparin and plavix.  The heparin we will hold about 4 hrs prior to her procedure; however, due to her recent DES placement, her plavix can not be held and we will have to proceed while still on this medication.  This will put her at a higher risk of a bleeding complication.  She is soon to be started on TFs.  We will hold this after MN on Sunday night/Monday morning so hopefully we can proceed with placement on Monday.  We will follow up with the patient and her family in order to obtain consent.  Thank you for this interesting consult.  I greatly enjoyed meeting ENCARNACION BOLE and look forward to participating in their care.  A copy of this report was sent to the requesting provider on this date.  Electronically Signed: Henreitta Cea 11/10/2016, 12:16 PM   I spent a total of 20 Minutes    in face to face in clinical consultation, greater than 50% of which was counseling/coordinating care for dysphagia secondary to VDRF

## 2016-11-10 NOTE — Progress Notes (Addendum)
Initial Nutrition Assessment  DOCUMENTATION CODES:   Non-severe (moderate) malnutrition in context of chronic illness  INTERVENTION:    Initiate TF via OGT with Vital AF 1.2 at goal rate of 50 ml/h (1200 ml per day) to provide 1440 kcals, 90 gm protein, 973 ml free water daily.  NUTRITION DIAGNOSIS:   Malnutrition related to chronic illness as evidenced by mild depletion of body fat, mild depletion of muscle mass, percent weight loss (9% weight loss within one month).  GOAL:   Patient will meet greater than or equal to 90% of their needs  MONITOR:   Vent status, Labs, TF tolerance, I & O's  REASON FOR ASSESSMENT:   Consult Enteral/tube feeding initiation and management  ASSESSMENT:   59 year old female with recent prolonged hospitalization secondary to papillary muscle rupture and subsequent HCAP. She was intubated requiring trach. Decannulated in the end of October. She then went to [facility] and was discharged 11/29. No presenting to [facility] ED 11/30 with compalints of SOB x1-2 weeks. She was seen by ENT they found cords were not opening from either scarring, inflammation, or nerve issue. They were hoping she would improve in the coming days, however, she did not and was taken for tracheostomy 12/7.  Discussed patient with RN today. Radiology consulted for PEG placement--may be able to place PEG on Monday.  Patient is a HD patient, received last HD yesterday. Received MD Consult for TF initiation and management. Patient is currently intubated on ventilator support MV: 9 L/min Temp (24hrs), Avg:98.7 F (37.1 C), Min:97.3 F (36.3 C), Max:100.4 F (38 C)  Nutrition-Focused physical exam completed. Findings are mild-moderate fat depletion, mild-moderate muscle depletion, and mild edema. 9% weight loss in the past month is significant. Patient with moderate PCM. Labs reviewed. CBG's: [phone] Medications reviewed and include Reglan, renal MVI. Receiving antibiotics for  bacteremia/HCAP.  Diet Order:  Diet NPO time specified Diet NPO time specified Except for: Sips with Meds  Skin:  Wound (see comment) (stage II to sacrum)  Last BM:  12/6  Height:   Ht Readings from Last 1 Encounters:  11/10/16 5\' 2"  (1.575 m)    Weight:   Wt Readings from Last 1 Encounters:  11/10/16 117 lb 1 oz (53.1 kg)    Ideal Body Weight:  50 kg  BMI:  Body mass index is 21.41 kg/m.  Estimated Nutritional Needs:   Kcal:  1434  Protein:  80-90 gm  Fluid:  1.6 L  EDUCATION NEEDS:   No education needs identified at this time  Molli Barrows, [place], [place], [place] Pager [phone] After Hours Pager [phone]

## 2016-11-10 NOTE — Progress Notes (Addendum)
INFECTIOUS DISEASE PROGRESS NOTE  ID: WENDY HOBACK is a 60 y.o. female with  Active Problems:   Mitral regurgitation   S/P MVR (mitral valve replacement)   HCAP (healthcare-associated pneumonia)   Diabetes mellitus type 2 in nonobese ([name])   ESRD on dialysis ([name])   Anemia of chronic disease   Atrial flutter ([name])   Respiratory distress  Subjective: No response on exam  Abtx:  Anti-infectives    Start     Dose/Rate Route Frequency Ordered Stop   11/10/16 0815  vancomycin (VANCOCIN) 500 mg in sodium chloride 0.9 % 100 mL IVPB     500 mg 100 mL/hr over 60 Minutes Intravenous  Once 11/10/16 0802     11/09/16 1830  rifampin (RIFADIN) 60 mg/mL oral suspension 300 mg     300 mg Oral Every 8 hours 11/09/16 1711     11/09/16 1200  vancomycin (VANCOCIN) 500 mg in sodium chloride 0.9 % 100 mL IVPB     500 mg 100 mL/hr over 60 Minutes Intravenous Every T-Th-Sa (Hemodialysis) 11/08/16 0919     11/08/16 1000  ceFEPIme (MAXIPIME) 1 g in dextrose 5 % 50 mL IVPB     1 g 100 mL/hr over 30 Minutes Intravenous Every 24 hours 11/08/16 0919     11/08/16 0930  vancomycin (VANCOCIN) 1,250 mg in sodium chloride 0.9 % 250 mL IVPB     1,250 mg 166.7 mL/hr over 90 Minutes Intravenous  Once 11/08/16 0919 11/08/16 1415      Medications:  Scheduled: . amiodarone  200 mg Oral Daily  . atorvastatin  20 mg Oral q1800  . ceFEPime (MAXIPIME) IV  1 g Intravenous Q24H  . chlorhexidine gluconate (MEDLINE KIT)  15 mL Mouth Rinse BID  . clonazePAM  0.5 mg Oral BID  . clopidogrel  75 mg Oral Daily  . darbepoetin (ARANESP) injection - DIALYSIS  60 mcg Intravenous Q Sat-HD  . ferric gluconate (FERRLECIT/NULECIT) IV  125 mg Intravenous Q T,Th,Sa-HD  . insulin aspart  0-15 Units Subcutaneous Q4H  . mouth rinse  15 mL Mouth Rinse QID  . metoCLOPramide  10 mg Oral BID  . midodrine  10 mg Oral q morning - 10a  . multivitamin  1 tablet Oral QHS  . pantoprazole sodium  40 mg Per Tube Daily  . rifampin  300 mg  Oral Q8H  . sodium chloride flush  3 mL Intravenous Q12H  . vancomycin  500 mg Intravenous Q T,Th,Sa-HD  . vancomycin  500 mg Intravenous Once  . warfarin  5 mg Oral ONCE-1800  . Warfarin - Pharmacist Dosing Inpatient   Does not apply q1800    Objective: Vital signs in last 24 hours: Temp:  [97.3 F (36.3 C)-100.4 F (38 C)] 97.3 F (36.3 C) (12/08 0751) Pulse Rate:  [63-129] 63 (12/08 0817) Resp:  [18-35] 27 (12/08 0817) BP: (49-199)/(36-101) 114/59 (12/08 0817) SpO2:  [96 %-100 %] 100 % (12/08 0700) FiO2 (%):  [40 %-50 %] 40 % (12/08 0817) Weight:  [52.9 kg (116 lb 10 oz)-53.1 kg (117 lb 1 oz)] 53.1 kg (117 lb 1 oz) (12/08 0130)   General appearance: no distress Resp: rhonchi bilaterally Cardio: regular rate and rhythm GI: normal findings: bowel sounds normal and soft, non-tender  Lab Results  Recent Labs  11/07/16 1902  11/09/16 1426 11/09/16 1801 11/10/16 0216  WBC 27.1*  < >  --  18.8* 16.9*  HGB 13.7  < > 13.9 11.1* 11.5*  HCT 42.0  < >  41.0 35.3* 36.1  NA 134*  --  135 135  --   K 3.2*  --  4.1 4.2  --   CL 93*  --   --  100*  --   CO2 25  --   --  18*  --   BUN 20  --   --  18  --   CREATININE 3.16*  --   --  2.62*  --   < > = values in this interval not displayed. Liver Panel  Recent Labs  11/07/16 1902 11/09/16 1801  PROT 7.5  --   ALBUMIN 3.1* 2.8*  AST 22  --   ALT 21  --   ALKPHOS 69  --   BILITOT 0.9  --    Sedimentation Rate No results for input(s): ESRSEDRATE in the last 72 hours. C-Reactive Protein No results for input(s): CRP in the last 72 hours.  Microbiology: Recent Results (from the past 240 hour(s))  MRSA PCR Screening     Status: Abnormal   Collection Time: 11/03/16  5:42 AM  Result Value Ref Range Status   MRSA by PCR POSITIVE (A) NEGATIVE Final    Comment:        The GeneXpert MRSA Assay (FDA approved for NASAL specimens only), is one component of a comprehensive MRSA colonization surveillance program. It is  not intended to diagnose MRSA infection nor to guide or monitor treatment for MRSA infections. RESULT CALLED TO, READ BACK BY AND VERIFIED WITH: M.SCHMIT RN AT [phone] 11/03/16 BY A.DAVIS   Culture, blood (Routine X 2) w Reflex to ID Panel     Status: None (Preliminary result)   Collection Time: 11/07/16  7:02 PM  Result Value Ref Range Status   Specimen Description BLOOD RIGHT ANTECUBITAL  Final   Special Requests IN PEDIATRIC BOTTLE 2CC  Final   Culture  Setup Time   Final    AEROBIC BOTTLE ONLY GRAM POSITIVE COCCI Organism ID to follow CRITICAL RESULT CALLED TO, READ BACK BY AND VERIFIED WITH: Narda Bonds, PHARMD @2343  11/08/16 MKELLY,MLT    Culture   Final    GRAM POSITIVE COCCI CULTURE REINCUBATED FOR BETTER GROWTH    Report Status PENDING  Incomplete  Blood Culture ID Panel (Reflexed)     Status: Abnormal   Collection Time: 11/07/16  7:02 PM  Result Value Ref Range Status   Enterococcus species NOT DETECTED NOT DETECTED Final   Listeria monocytogenes NOT DETECTED NOT DETECTED Final   Staphylococcus species DETECTED (A) NOT DETECTED Final    Comment: CRITICAL RESULT CALLED TO, READ BACK BY AND VERIFIED WITH: JAMES LEDFORD, PHARMD @2343  11/08/16 MKELLY,MLT    Staphylococcus aureus DETECTED (A) NOT DETECTED Final    Comment: CRITICAL RESULT CALLED TO, READ BACK BY AND VERIFIED WITH: JAMES LEDFORD, PHARMD @2343  11/08/16 MKELLY,MLT    Methicillin resistance NOT DETECTED NOT DETECTED Final   Streptococcus species NOT DETECTED NOT DETECTED Final   Streptococcus agalactiae NOT DETECTED NOT DETECTED Final   Streptococcus pneumoniae NOT DETECTED NOT DETECTED Final   Streptococcus pyogenes NOT DETECTED NOT DETECTED Final   Acinetobacter baumannii NOT DETECTED NOT DETECTED Final   Enterobacteriaceae species NOT DETECTED NOT DETECTED Final   Enterobacter cloacae complex NOT DETECTED NOT DETECTED Final   Escherichia coli NOT DETECTED NOT DETECTED Final   Klebsiella oxytoca NOT  DETECTED NOT DETECTED Final   Klebsiella pneumoniae NOT DETECTED NOT DETECTED Final   Proteus species NOT DETECTED NOT DETECTED Final   Serratia marcescens NOT  DETECTED NOT DETECTED Final   Haemophilus influenzae NOT DETECTED NOT DETECTED Final   Neisseria meningitidis NOT DETECTED NOT DETECTED Final   Pseudomonas aeruginosa NOT DETECTED NOT DETECTED Final   Candida albicans NOT DETECTED NOT DETECTED Final   Candida glabrata NOT DETECTED NOT DETECTED Final   Candida krusei NOT DETECTED NOT DETECTED Final   Candida parapsilosis NOT DETECTED NOT DETECTED Final   Candida tropicalis NOT DETECTED NOT DETECTED Final  Culture, blood (Routine X 2) w Reflex to ID Panel     Status: None (Preliminary result)   Collection Time: 11/07/16  7:08 PM  Result Value Ref Range Status   Specimen Description BLOOD RIGHT HAND  Final   Special Requests BOTTLES DRAWN AEROBIC ONLY 5CC  Final   Culture  Setup Time   Final    GRAM POSITIVE COCCI IN CLUSTERS AEROBIC BOTTLE ONLY CRITICAL VALUE NOTED.  VALUE IS CONSISTENT WITH PREVIOUSLY REPORTED AND CALLED VALUE.    Culture GRAM POSITIVE COCCI  Final   Report Status PENDING  Incomplete    Studies/Results: Dg Chest Port 1 View  Result Date: 11/10/2016 CLINICAL DATA:  Vent trach pt sob assess lines and tubes EXAM: PORTABLE CHEST 1 VIEW COMPARISON:  11/09/2016 FINDINGS: Tracheostomy tube appears unchanged, approximately 6 cm above the carina. Left-sided dual lumen catheter tip overlies the level of the lower superior vena cava. Patient has had median sternotomy and valve replacement. A nasogastric tube is in place, tip off the image beyond the gastroesophageal junction. Small amount of contrast is identified in the left upper quadrant of the abdomen. The heart is enlarged. There is minimal opacity at the left lung base which is similar in appearance to previous exams. No evidence for significant pulmonary edema. IMPRESSION: 1. Lines and tubes as described. 2. Persistent  left lower lobe opacity. 3. Cardiomegaly without evidence for pulmonary edema. Electronically Signed   By: Nolon Nations M.D.   On: 11/10/2016 08:03   Dg Chest Port 1 View  Result Date: 11/09/2016 CLINICAL DATA:  Tracheostomy placement EXAM: PORTABLE CHEST 1 VIEW COMPARISON:  11/08/2016 FINDINGS: Heart size is top-normal. Retrocardiac opacity persists which may represent chronic atelectasis and/or infiltrate. No overt pulmonary edema. Patient status post mitral valvular replacement. Median sternotomy sutures are in place. Left IJ dialysis catheter tip is stable in at the cavoatrial junction. A tracheostomy tube tip is seen centered over the upper third of the trachea with tip approximately 4.8 cm above the carina. Gastric tube extends into the stomach. IMPRESSION: Satisfactory support line and tube positions. Chronic retrocardiac opacity which may reflect atelectasis and/or pneumonia. Electronically Signed   By: Ashley Royalty M.D.   On: 11/09/2016 19:11   Dg Abd Portable 1v  Result Date: 11/09/2016 CLINICAL DATA:  60 y/o  F; enteric tube placement. EXAM: PORTABLE ABDOMEN - 1 VIEW COMPARISON:  10/07/2016 abdominal radiograph. FINDINGS: Retained contrast in the colon. Enteric tube tip projects over the mid stomach. Partially visualized median sternotomy and valve replacement. Right central venous catheter tip projects over cavoatrial junction. IMPRESSION: Enteric tube tip projects over mid stomach. Electronically Signed   By: Kristine Garbe M.D.   On: 11/09/2016 19:00     Assessment/Plan: Staph bacteremia HCAP Steroid induced leukocytosis Prev CV surgery (MV bioprosthesis) Vocal cord paralysis, trach redo 12-7  Stop cefepime at day 8 (12-13) continue vanco Continue rifampin (prev LFs normal) Consider pulling her HD line when assured that her graft is mature (possibly tomorrow) TEE Repeat BCx 12-7 pending  Total days of  antibiotics: 3 vanco/cefpime/rifampin         Bobby Rumpf Infectious Diseases (pager) [phone] [email or web address] 11/10/2016, 8:37 AM  LOS: 8 days

## 2016-11-10 NOTE — Progress Notes (Signed)
Extensive update given to patient son at bedside. Advised him that she is more awake than this morning, he did not verbalize understanding. He kept asking me "why does she have those mittens on her hands". I explained the use of the safety mittens while patient was still "groggy". Even after extensive conversation the patient's son continued to ask questions that were repetitive and alluding to his confusion about her status. I finally simplified the information to state "your mother is getting better".   Son stated to me : "You said her eyes were open this morning and when I came she had her eyes closed."  Once again the patient's son has expressed a very poor understanding of hospital care and the critical yet stable condition of his mother.  Son expressed to me that he didn't want his mother to feel like she was "in prison" because she had safety mittens on her hands. I once again explained to him that these mittens were for her safety and that until she is more awake she needs them so she doesn't accidentally pull out her tubes and harm herself.  He was still very un accepting despite the multiple hospital admissions and previous use of more invasive restraints.  When patient was asked if the mittens bothered her at all she nodded to express "no". I again asked in front of family and she nodded "no" so as to express the mittens were not a concern.

## 2016-11-10 NOTE — Progress Notes (Signed)
Pharmacy Antibiotic Note Deborah [name] is a 60 y.o. female admitted on 11/02/2016 that is currently on day 3 of Cefepime and vancomycin along with day 2 of rifampin for treatment of PNA and MSSA bacteremia per BCID.    Plan: 1. Continue Cefepime 1 gram IV every 24 hours 2. Vancomycin dose for 12/7 not documented so will give vancomycin 500 mg IV x 1 today and continue with dosing with IHD on TTS 3. Follow up final culture data   Height: 5\' 2"  (157.5 cm) Weight: 117 lb 1 oz (53.1 kg) IBW/kg (Calculated) : 50.1  Temp (24hrs), Avg:98.7 F (37.1 C), Min:97.3 F (36.3 C), Max:100.4 F (38 C)   Recent Labs Lab 11/04/16 0358  11/07/16 1110 11/07/16 1902 11/08/16 0433 11/09/16 0421 11/09/16 1801 11/10/16 0216  WBC 16.7*  < >  --  27.1* 30.5* 24.5* 18.8* 16.9*  CREATININE 4.70*  --  7.04* 3.16*  --   --  2.62*  --   < > = values in this interval not displayed.  Estimated Creatinine Clearance: 18.3 mL/min (by C-G formula based on SCr of 2.62 mg/dL (H)).    No Known Allergies  Antimicrobials: Vanco 12/6>> Cefepime 12/6>> Rifampin 12/7 >>   Microbiology results:  11/29 UCx: mult species - recollect 12/1 MRSA PCR: positive 12/5 bC x 2: BCID 1/2 MSSA 12/7 BCx: px  Thank you for allowing pharmacy to be a part of this patient's care.  Vincenza Hews, PharmD, BCPS 11/10/2016, 1:38 PM Pager: [phone]

## 2016-11-10 NOTE — Progress Notes (Signed)
Patient Daughter Deborah [name] called me this afternoon and asked for an update, she did not have the password and therefore I could not provide her information over the phone. She advised me that she was in the hospital and would like to come see her mother. She also asked me "Is my mama on life support", which I could not answer without her HIPAA password.    Deborah [name] came to bedside during non-visitor hours and her brother was at the bedside as well. She and him began having an aggressive conversation concerning the care of their mother at the bedside. Deborah [name] then asked if I could give her an update on her mother outside the room. I then gave her a full update on her mother. During this update she continually told me stories of her other siblings and issues with law enforcement and lawyers, for which I attempted to re-direct to focus on her mother's care and not discuss her apparent family and law enforcement concerns. During our conversation she was typing on her phone and half heartedly listening to the update I gave her concerning her mother.   Deborah [name] told me that she was her mother's primary care giver yet this was her first time calling and or talking to anyone about her mother's care during this admission. She as admitted 11/30-2017 today is 11/10/2016.  She told me " I didn't wanna get into that family drama." I again tried to place the focus on her mother's care and away from her compliants about other family members.  Deborah [name] left the unit and verbalized understanding of her mother's status, we sat up a password for her to receive information and she was very pleasant and thankful upon exiting the unit. Will continue to monitor patient very closely.

## 2016-11-10 NOTE — Plan of Care (Signed)
Patient seen this morning, she is currently on mechanical ventilation via her tracheostomy, she is also on Precedex strip. I have discussed this case with critical care they are the primary on this patient. I will assume care once patient comes out of ICU.

## 2016-11-10 NOTE — Progress Notes (Signed)
ANTICOAGULATION CONSULT NOTE - Initial Consult  Pharmacy Consult for Heparin/Warfarin  Indication: atrial fibrillation  No Known Allergies  Patient Measurements: Height: 5\' 2"  (157.5 cm) Weight: 117 lb 1 oz (53.1 kg) IBW/kg (Calculated) : 50.1  Vital Signs: Temp: 98.8 F (37.1 C) (12/08 0326) Temp Source: Oral (12/08 0326) BP: 106/48 (12/08 0500) Pulse Rate: 67 (12/08 0500)  Labs:  Recent Labs  11/07/16 1110  11/07/16 1902 11/08/16 0433  11/08/16 1500 11/09/16 0421 11/09/16 1426 11/09/16 1801 11/10/16 0216  HGB  --   < > 13.7 12.3  --   --  12.0 13.9 11.1* 11.5*  HCT  --   < > 42.0 38.4  --   --  37.9 41.0 35.3* 36.1  PLT  --   < > 462* 369  --   --  350  --  305 298  LABPROT  --   < > 21.2* 20.1*  --   --  17.4*  --   --  16.5*  INR  --   < > 1.80 1.70  --   --  1.41  --   --  1.33  HEPARINUNFRC  --   --   --  1.08*  < > 0.57 0.40  --   --  0.31  CREATININE 7.04*  --  3.16*  --   --   --   --   --  2.62*  --   TROPONINI  --   --  0.07* 0.09*  --   --   --   --   --   --   < > = values in this interval not displayed.  Estimated Creatinine Clearance: 18.3 mL/min (by C-G formula based on SCr of 2.62 mg/dL (H)).   Medical History: Past Medical History:  Diagnosis Date  . Diabetes mellitus   . Hyperlipidemia   . Hypertension   . MI, old   . Renal disorder    dialysls    Assessment: Warfarin previously on hold for trach, pt was on heparin in the meantime, now re-starting warfarin. Hgb 11.5. INR 1.33 this AM. Heparin level therapeutic at 0.31.   Goal of Therapy:  INR 2-3 Heparin level 0.3-0.7 units/ml Monitor platelets by anticoagulation protocol: Yes   Plan:  -Continue heparin at 900 units/hr -Daily CBC/HL -Warfarin 5 mg PO x 1 at 1800 -Daily PT/INR -Monitor for bleeding -DC heparin when INR >2  Narda Bonds 11/10/2016,6:17 AM

## 2016-11-10 NOTE — Progress Notes (Signed)
Precidex weaned as per Dr. Ashok Cordia

## 2016-11-10 NOTE — Progress Notes (Addendum)
Name: Deborah [name] MRN: [id number] DOB: Jun 11, 1956    ADMISSION DATE:  11/02/2016 CONSULTATION DATE:  11/02/2016  REFERRING MD :  Dr. Regenia Skeeter EDP  CHIEF COMPLAINT:  SOB  BRIEF PATIENT DESCRIPTION: 60 year old female with recent prolonged hospitalization secondary to papillary muscle rupture and subsequent HCAP. She was intubated requiring trach. Decannulated in the end of October. She then went to [facility] and was discharged 11/29. No presenting to [facility] ED 11/30 with compalints of SOB x1-2 weeks. She was seen by ENT they found cords were not opening from either scarring, inflammation, or nerve issue. They were hoping she would improve in the coming days, however, she did not and was taken for tracheostomy 12/7.  SIGNIFICANT EVENTS  09/12 Admit to [facility] 09/15 To [facility] 09/20 MVR with bioprosthetic valve 09/28 Permcath for HD 09/29 Extubated 09/30 Reintubated 10/05 Trach by Hyman Bible 10/09 Off vent 10/25 decannulated 11/8 to CIR 11/29 DC CIR 11/30 admit for resp distress  STUDIES:    SUBJECTIVE:    VITAL SIGNS: Temp:  [97.3 F (36.3 C)-100.4 F (38 C)] 100.4 F (38 C) (12/07 2315) Pulse Rate:  [79-129] 96 (12/08 0130) Resp:  [18-35] 20 (12/08 0130) BP: (49-199)/(36-101) 110/94 (12/08 0130) SpO2:  [96 %-100 %] 100 % (12/08 0130) FiO2 (%):  [40 %-50 %] 40 % (12/08 0009) Weight:  [51.6 kg (113 lb 12.1 oz)-53.1 kg (117 lb 1 oz)] 53.1 kg (117 lb 1 oz) (12/08 0130)  PHYSICAL EXAMINATION: General:  Chronically ill appearing female, resting in bed. Neuro:  Alert, oriented, non-focal, flat affect. HEENT:  [place]/AT, PERRL, no JVD. Cardiovascular:  RRR, no MRG . Lungs:  Insp/exp wheeze, also transmitted upper airway rasp, no true stridor.  Abdomen:  No acute deformity or ROM limitation. Musculoskeletal:  No acute deformity or ROM limitation. Skin:  Grossly intact.   Recent Labs Lab 11/07/16 1110 11/07/16 1902 11/09/16 1426 11/09/16 1801  NA 128* 134* 135 135  K 3.9 3.2* 4.1 4.2  CL  91* 93*  --  100*  CO2 22 25  --  18*  BUN 70* 20  --  18  CREATININE 7.04* 3.16*  --  2.62*  GLUCOSE 388* 147* 103* 139*    Recent Labs Lab 11/08/16 0433 11/09/16 0421 11/09/16 1426 11/09/16 1801  HGB 12.3 12.0 13.9 11.1*  HCT 38.4 37.9 41.0 35.3*  WBC 30.5* 24.5*  --  18.8*  PLT 369 350  --  305   Dg Chest 2 View  Result Date: 11/08/2016 CLINICAL DATA:  Short of breath, hypertension, diabetes EXAM: CHEST  2 VIEW COMPARISON:  Portable chest x-ray of 11/07/2016 FINDINGS: Opacity remains at the left arm right lung base with apparent air bronchograms most consistent with left lower lobe pneumonia. Mild atelectasis at the right lung base remains. No definite pleural effusion is seen. Cardiomegaly is stable. Left central venous line is unchanged in position. Mitral valve replacement is present. IMPRESSION: 1. Persistent left lower lobe opacity most suspicious for pneumonia. 2. Stable cardiomegaly with central venous line. 3. Right basilar linear atelectasis. Electronically Signed   By: Ivar Drape M.D.   On: 11/08/2016 08:39   Dg Chest Port 1 View  Result Date: 11/09/2016 CLINICAL DATA:  Tracheostomy placement EXAM: PORTABLE CHEST 1 VIEW COMPARISON:  11/08/2016 FINDINGS: Heart size is top-normal. Retrocardiac opacity persists which may represent chronic atelectasis and/or infiltrate. No overt pulmonary edema. Patient status post mitral valvular replacement. Median sternotomy sutures are in place. Left IJ dialysis catheter tip is  stable in at the cavoatrial junction. A tracheostomy tube tip is seen centered over the upper third of the trachea with tip approximately 4.8 cm above the carina. Gastric tube extends into the stomach. IMPRESSION: Satisfactory support line and tube positions. Chronic retrocardiac opacity which may reflect atelectasis and/or pneumonia. Electronically Signed   By: Ashley Royalty M.D.   On: 11/09/2016 19:11   Dg Abd Portable 1v  Result Date: 11/09/2016 CLINICAL DATA:  60  y/o  F; enteric tube placement. EXAM: PORTABLE ABDOMEN - 1 VIEW COMPARISON:  10/07/2016 abdominal radiograph. FINDINGS: Retained contrast in the colon. Enteric tube tip projects over the mid stomach. Partially visualized median sternotomy and valve replacement. Right central venous catheter tip projects over cavoatrial junction. IMPRESSION: Enteric tube tip projects over mid stomach. Electronically Signed   By: Kristine Garbe M.D.   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  - continue dopamine infusion for goal MAP > 71mmHg, suspect this will wean off quickly - Bolus 1L NS now - Continue midodrine per tube - Assess lactic  Paroxysmal atrial fibrillation - Telemetry - Rate controlled - Continue amiodarone - Continue heparin per pharmacy, will need to transition back to coumadin. - Follow INR  HCAP - Bacteremia - Cefepime 12/6 > - vancomycin 12/6 > - Cultures blood 12/5 > GPC clusters >>>  CAD, MVR -Continue home plavix, statin  DM - CBG monitoring Q4 hours - Continue home dose insulin with sliding scale (Levemir 8 units daily)  GERD - Continue pantoprazole BID - TF per protocol  Leukocytosis - afebrile, no obvious s/s infection - Monitor fever/ WBC - Consider Pan Culture if develops fever  ESRD on HD TTS - HD per renal - Monitor BMET daily  Anxiety - PRN fentanyl - Precedex  Anemia of chronic illness - monitor  To ICU post op > can likely tx to SDU in AM  Georgann Housekeeper, AGACNP-BC [facility] Pulmonology/Critical Care Pager [phone] or [phone]  11/10/2016 3:00 AM

## 2016-11-11 DIAGNOSIS — T82898A Other specified complication of vascular prosthetic devices, implants and grafts, initial encounter: Secondary | ICD-10-CM

## 2016-11-11 DIAGNOSIS — D649 Anemia, unspecified: Secondary | ICD-10-CM

## 2016-11-11 DIAGNOSIS — I4892 Unspecified atrial flutter: Secondary | ICD-10-CM

## 2016-11-11 DIAGNOSIS — R0603 Acute respiratory distress: Secondary | ICD-10-CM

## 2016-11-11 DIAGNOSIS — J9621 Acute and chronic respiratory failure with hypoxia: Secondary | ICD-10-CM

## 2016-11-11 DIAGNOSIS — E119 Type 2 diabetes mellitus without complications: Secondary | ICD-10-CM

## 2016-11-11 LAB — GLUCOSE, CAPILLARY
GLUCOSE-CAPILLARY: 269 mg/dL — AB (ref 65–99)
GLUCOSE-CAPILLARY: 227 mg/dL — AB (ref 65–99)
GLUCOSE-CAPILLARY: 303 mg/dL — AB (ref 65–99)
Glucose-Capillary: 212 mg/dL — ABNORMAL HIGH (ref 65–99)
Glucose-Capillary: 219 mg/dL — ABNORMAL HIGH (ref 65–99)
Glucose-Capillary: 286 mg/dL — ABNORMAL HIGH (ref 65–99)

## 2016-11-11 LAB — RENAL FUNCTION PANEL
Albumin: 2 g/dL — ABNORMAL LOW (ref 3.5–5.0)
Anion gap: 13 (ref 5–15)
BUN: 46 mg/dL — AB (ref 6–20)
CO2: 21 mmol/L — AB (ref 22–32)
Calcium: 8.4 mg/dL — ABNORMAL LOW (ref 8.9–10.3)
Chloride: 102 mmol/L (ref 101–111)
Creatinine, Ser: 4.35 mg/dL — ABNORMAL HIGH (ref 0.44–1.00)
GFR calc Af Amer: 12 mL/min — ABNORMAL LOW (ref >60)
GFR calc non Af Amer: 10 mL/min — ABNORMAL LOW (ref >60)
GLUCOSE: 198 mg/dL — AB (ref 65–99)
PHOSPHORUS: 3.3 mg/dL (ref 2.5–4.6)
Potassium: 3.8 mmol/L (ref 3.5–5.1)
SODIUM: 136 mmol/L (ref 135–145)

## 2016-11-11 LAB — CULTURE, BLOOD (ROUTINE X 2)

## 2016-11-11 LAB — CBC
HEMATOCRIT: 32.4 % — AB (ref 36.0–46.0)
HEMOGLOBIN: 10.1 g/dL — AB (ref 12.0–15.0)
MCH: 27.5 pg (ref 26.0–34.0)
MCHC: 31.2 g/dL (ref 30.0–36.0)
MCV: 88.3 fL (ref 78.0–100.0)
PLATELETS: 253 10*3/uL (ref 150–400)
RBC: 3.67 MIL/uL — ABNORMAL LOW (ref 3.87–5.11)
RDW: 18.6 % — AB (ref 11.5–15.5)
WBC: 17.4 10*3/uL — ABNORMAL HIGH (ref 4.0–10.5)

## 2016-11-11 LAB — PROTIME-INR
INR: 1.48
Prothrombin Time: 18.1 seconds — ABNORMAL HIGH (ref 11.4–15.2)

## 2016-11-11 LAB — HEPARIN LEVEL (UNFRACTIONATED): Heparin Unfractionated: 0.33 [IU]/mL (ref 0.30–0.70)

## 2016-11-11 LAB — MAGNESIUM: MAGNESIUM: 1.7 mg/dL (ref 1.7–2.4)

## 2016-11-11 MED ORDER — DOCUSATE SODIUM 50 MG/5ML PO LIQD
100.0000 mg | Freq: Two times a day (BID) | ORAL | Status: DC
  Administered 2016-11-11 – 2016-11-24 (×16): 100 mg
  Filled 2016-11-11 (×22): qty 10

## 2016-11-11 MED ORDER — LIDOCAINE HCL (PF) 1 % IJ SOLN
INTRAMUSCULAR | Status: AC
  Filled 2016-11-11: qty 5

## 2016-11-11 MED ORDER — INSULIN DETEMIR 100 UNIT/ML ~~LOC~~ SOLN
8.0000 [IU] | Freq: Every day | SUBCUTANEOUS | Status: DC
  Administered 2016-11-11 – 2016-11-12 (×2): 8 [IU] via SUBCUTANEOUS
  Filled 2016-11-11 (×3): qty 0.08

## 2016-11-11 MED ORDER — CEFAZOLIN IN D5W 1 GM/50ML IV SOLN
1.0000 g | INTRAVENOUS | Status: DC
  Administered 2016-11-11 – 2016-11-24 (×12): 1 g via INTRAVENOUS
  Filled 2016-11-11 (×17): qty 50

## 2016-11-11 MED ORDER — DARBEPOETIN ALFA 60 MCG/0.3ML IJ SOSY
PREFILLED_SYRINGE | INTRAMUSCULAR | Status: AC
  Filled 2016-11-11: qty 0.3

## 2016-11-11 MED ORDER — LIDOCAINE HCL (PF) 1 % IJ SOLN
5.0000 mL | Freq: Once | INTRAMUSCULAR | Status: AC
  Administered 2016-11-11: 5 mL via INTRADERMAL

## 2016-11-11 MED ORDER — PROCHLORPERAZINE EDISYLATE 5 MG/ML IJ SOLN
10.0000 mg | INTRAMUSCULAR | Status: DC | PRN
  Administered 2016-11-11 – 2016-11-12 (×2): 10 mg via INTRAVENOUS
  Filled 2016-11-11 (×3): qty 2

## 2016-11-11 MED ORDER — GUAIFENESIN-CODEINE 100-10 MG/5ML PO SOLN
5.0000 mL | Freq: Four times a day (QID) | ORAL | Status: DC
  Administered 2016-11-11 – 2016-11-26 (×56): 5 mL
  Filled 2016-11-11 (×55): qty 5

## 2016-11-11 MED ORDER — SENNOSIDES 8.8 MG/5ML PO SYRP
5.0000 mL | ORAL_SOLUTION | Freq: Two times a day (BID) | ORAL | Status: DC
  Administered 2016-11-11 – 2016-11-24 (×15): 5 mL
  Filled 2016-11-11 (×21): qty 5

## 2016-11-11 NOTE — Progress Notes (Signed)
Patient ID: Deborah [name], female   DOB: 23-Aug-1956, 60 y.o.   MRN: [id number]  [facility] Progress Note   Assessment/ Plan:   1. Cough/dyspnea/respiratory failure-status post tracheostomy 12/7 for recurrent tracheal stenosis, currently on ventilator support and anticipated to have chronic weaning course. 2. ESRD: Continue hemodialysis on a Tuesday/Thursday/Saturday schedule with dialysis ordered for later today. Use left upper arm AV graft and plan to discontinue left IJ tunneled hemodialysis catheter to help with source control of Staphylococcus bacteremia. 3. Anemia: Hemoglobin levels acceptable, continue to monitor status post tracheostomy for additional need to titrate ESA 4. CKD-MBD: Currently nothing by mouth while intubated, binders on hold, not on VDRA with suppressed PTH 5. Nutrition: Appears to be frail and gradually failing to thrive with malnutrition-continue nutritional supplementation 6. Hypertension: Blood pressures appear to well controlled, continue to monitor  Subjective:   She denies any pain or discomfort at this time and notes as I explained to her the plan of doing dialysis later today. Interesting interactions noted with family members per documentation.    Objective:   BP (!) 104/54   Pulse 64   Temp 98 F (36.7 C) (Oral)   Resp 15   Ht _0  (1.575 m)   Wt 55.6 kg (122 lb 9.2 oz)   SpO2 100%   BMI 22.42 kg/m   Physical Exam: IWL:[id number], awake and responds to questions CVS: Pulse regular rhythm, normal rate, S1 and S2 normal Resp: Anteriorly clear to auscultation, no rales/rhonchi Abd: Soft, flat, nontender Ext: No lower extremity edema. Left upper arm AVG with good thrill/bruit. Left IJ [facility]  Labs: BMET  Recent Labs Lab 11/07/16 1110 11/07/16 1902 11/09/16 1426 11/09/16 1801 11/11/16 0220  NA 128* 134* 135 135 136  K 3.9 3.2* 4.1 4.2 3.8  CL 91* 93*  --  100* 102  CO2 22 25  --  18* 21*  GLUCOSE 388* 147* 103* 139* 198*  BUN  70* 20  --  18 46*  CREATININE 7.04* 3.16*  --  2.62* 4.35*  CALCIUM 9.0 9.2  --  8.6* 8.4*  PHOS 3.7  --   --  3.5 3.3   CBC  Recent Labs Lab 11/09/16 0421 11/09/16 1426 11/09/16 1801 11/10/16 0216 11/11/16 0220  WBC 24.5*  --  18.8* 16.9* 17.4*  HGB 12.0 13.9 11.1* 11.5* 10.1*  HCT 37.9 41.0 35.3* 36.1 32.4*  MCV 88.3  --  88.7 88.3 88.3  PLT 350  --  305 298 253   Medications:    . amiodarone  200 mg Per Tube Daily  . atorvastatin  20 mg Per Tube q1800  . ceFEPime (MAXIPIME) IV  1 g Intravenous Q24H  . chlorhexidine gluconate (MEDLINE KIT)  15 mL Mouth Rinse BID  . clonazePAM  0.5 mg Per Tube BID  . clopidogrel  75 mg Per Tube Daily  . darbepoetin (ARANESP) injection - DIALYSIS  60 mcg Intravenous Q Sat-HD  . ferric gluconate (FERRLECIT/NULECIT) IV  125 mg Intravenous Q T,Th,Sa-HD  . insulin aspart  0-15 Units Subcutaneous Q4H  . mouth rinse  15 mL Mouth Rinse QID  . metoCLOPramide  10 mg Per Tube BID  . midodrine  10 mg Per Tube q morning - 10a  . multivitamin  1 tablet Oral QHS  . pantoprazole sodium  40 mg Per Tube Daily  . rifampin  300 mg Per Tube Q8H  . sodium chloride flush  3 mL Intravenous Q12H  . vancomycin  500  mg Intravenous Q T,Th,Sa-HD   Elmarie Shiley, MD 11/11/2016, 8:06 AM

## 2016-11-11 NOTE — Progress Notes (Signed)
[place] Progress Note Patient Name: Deborah [name] DOB: [birth year] MRN: [id number]   Date of Service  11/11/2016  HPI/Events of Note  1. N/V - Enteral feeds on hold. Can't order Zofran or Phenergan d/t prolonged QT history.   eICU Interventions  Will order: 1. Compazine 10 mg IV Q 4 hours PRN N/V.     Intervention Category Intermediate Interventions: Other:  Lysle Dingwall 11/11/2016, 8:54 PM

## 2016-11-11 NOTE — Op Note (Signed)
    Patient name: Deborah [name] MRN: [id number] DOB: 06-18-1956 Sex: female  11/02/2016 - 11/09/2016 Pre-operative Diagnosis: dialysis catheter related sepsis Post-operative diagnosis:  Same Surgeon:  Annamarie Major Procedure:   Removal of left Tunneled IJ dialysis catheter Specimens:  Catheter tip for cx   Indications:  I was asked by Dr. Posey Pronto to remove the left IJ dialysis catheter due to bacteremia.  The patient has a functioning left upper arm AVGG  Procedure:  The patient was identified in her room, 2M08.  The catheter exit site was sterilly prepped.  1% lidocaine was used for local anesthesia Using blunt dissection, the cuff was exposed and the catheter was removed in its entirety.  The tip was sent for culture.  A pressure dressing was applied.  There were no immediate complications   Disposition:  To PACU in stable condition.   Theotis Burrow, M.D. [facility] Office: [phone] Pager:  [phone]

## 2016-11-11 NOTE — Progress Notes (Signed)
Name: Deborah [name] MRN: [id number] DOB: 1956/04/21    ADMISSION DATE:  11/02/2016 CONSULTATION DATE:  11/02/2016  REFERRING MD :  Dr. Regenia Skeeter EDP  CHIEF COMPLAINT:  SOB  BRIEF PATIENT DESCRIPTION: 60 year old female with recent prolonged hospitalization secondary to papillary muscle rupture and subsequent HCAP. She was intubated requiring trach. Decannulated in the end of October. She then went to [facility] and was discharged 11/29. No presenting to [facility] ED 11/30 with compalints of SOB x1-2 weeks. She was seen by ENT they found cords were not opening from either scarring, inflammation, or nerve issue. They were hoping she would improve in the coming days, however, she did not and was taken for tracheostomy 12/7.  SIGNIFICANT EVENTS  09/12 Admit to [facility] 09/15 To [facility] 09/20 MVR with bioprosthetic valve 09/28 Permcath for HD 09/29 Extubated 09/30 Reintubated 10/05 Trach by Hyman Bible 10/09 Off vent 10/25 decannulated 11/8 to CIR 11/29 DC CIR 11/30 admit for resp distress 12/09 L IJ Tunneled HD catheter removed  STUDIES:   SUBJECTIVE:  Tunneled HD catheter was removed this morning. Undergoing HD today via graft. Denies any pain or difficulty breathing.   REVIEW OF SYSTEMS:  Unable to obtain with sedation & ventilator requirement.   VITAL SIGNS: Temp:  [97.3 F (36.3 C)-98.4 F (36.9 C)] 97.4 F (36.3 C) (12/09 1300) Pulse Rate:  [58-111] 111 (12/09 1533) Resp:  [15-33] 33 (12/09 1533) BP: (85-155)/(26-115) 155/72 (12/09 1533) SpO2:  [94 %-100 %] 98 % (12/09 1533) FiO2 (%):  [40 %] 40 % (12/09 1533) Weight:  [122 lb 9.2 oz (55.6 kg)-123 lb 7.3 oz (56 kg)] 123 lb 7.3 oz (56 kg) (12/09 1300)  PHYSICAL EXAMINATION: General:  Elderly female. No distress. Neuro:  Awake. Follows commands. No meningismus.  HEENT:  Moist membranes. No scleral injection. Minimal bleeding around tracheostomy stoma. Cardiovascular:  Regular rate. No edema or JVD. Pulmonary:  Good aeration bilaterally. Symmetric  chest wall expansion on ventilator. Abdomen:  Soft. Nondistended. Normal bowel sounds. Integument:  Warm & dry. No rash on exposed skin.    Recent Labs Lab 11/07/16 1902 11/09/16 1426 11/09/16 1801 11/11/16 0220  NA 134* 135 135 136  K 3.2* 4.1 4.2 3.8  CL 93*  --  100* 102  CO2 25  --  18* 21*  BUN 20  --  18 46*  CREATININE 3.16*  --  2.62* 4.35*  GLUCOSE 147* 103* 139* 198*    Recent Labs Lab 11/09/16 1801 11/10/16 0216 11/11/16 0220  HGB 11.1* 11.5* 10.1*  HCT 35.3* 36.1 32.4*  WBC 18.8* 16.9* 17.4*  PLT 305 298 253   Dg Chest Port 1 View  Result Date: 11/10/2016 CLINICAL DATA:  Vent trach pt sob assess lines and tubes EXAM: PORTABLE CHEST 1 VIEW COMPARISON:  11/09/2016 FINDINGS: Tracheostomy tube appears unchanged, approximately 6 cm above the carina. Left-sided dual lumen catheter tip overlies the level of the lower superior vena cava. Patient has had median sternotomy and valve replacement. A nasogastric tube is in place, tip off the image beyond the gastroesophageal junction. Small amount of contrast is identified in the left upper quadrant of the abdomen. The heart is enlarged. There is minimal opacity at the left lung base which is similar in appearance to previous exams. No evidence for significant pulmonary edema. IMPRESSION: 1. Lines and tubes as described. 2. Persistent left lower lobe opacity. 3. Cardiomegaly without evidence for pulmonary edema. Electronically Signed   By: Nolon Nations M.D.   On:  11/10/2016 08:03   Dg Chest Port 1 View  Result Date: 11/09/2016 CLINICAL DATA:  Tracheostomy placement EXAM: PORTABLE CHEST 1 VIEW COMPARISON:  11/08/2016 FINDINGS: Heart size is top-normal. Retrocardiac opacity persists which may represent chronic atelectasis and/or infiltrate. No overt pulmonary edema. Patient status post mitral valvular replacement. Median sternotomy sutures are in place. Left IJ dialysis catheter tip is stable in at the cavoatrial junction. A  tracheostomy tube tip is seen centered over the upper third of the trachea with tip approximately 4.8 cm above the carina. Gastric tube extends into the stomach. IMPRESSION: Satisfactory support line and tube positions. Chronic retrocardiac opacity which may reflect atelectasis and/or pneumonia. Electronically Signed   By: Ashley Royalty M.D.   On: 11/09/2016 19:11   Dg Abd Portable 1v  Result Date: 11/09/2016 CLINICAL DATA:  60 y/o  F; enteric tube placement. EXAM: PORTABLE ABDOMEN - 1 VIEW COMPARISON:  10/07/2016 abdominal radiograph. FINDINGS: Retained contrast in the colon. Enteric tube tip projects over the mid stomach. Partially visualized median sternotomy and valve replacement. Right central venous catheter tip projects over cavoatrial junction. IMPRESSION: Enteric tube tip projects over mid stomach. Electronically Signed   By: Kristine Garbe M.D.   On: 11/09/2016 19:00   MICROBIOLOGY: Urine Ctx 11/27:  Multiple species MRSA PCR 12/1:  Positive Blood Ctx x2 12/5 >> 2/2 Staph aureus  ANTIBIOTICS: Cefepime 12/6 - 12/9 Vancomycin 12/6 - 12/9 Rifapmin 12/7 >> Cefazolin 12/9 >>  LINES/TUBES: Tracheostomy 6.4 cuffed [name]) 12/7 >> PIV  ASSESSMENT / PLAN:  60 y.o. female with Vocal cord paralysis status post repeat tracheostomy. Patient with acute on chronic hypertension resolved after fluid bolus. Continuing on broad-spectrum antibiotics for sepsis secondary to bacteremia/healthcare associated pneumonia per ID recommendations.  1. Vocal Cord Paralysis:  S/P repeat trach by ENT on 12/7. Tracheostomy care per ENT. Begin ventilator wean & transition to tracheostomy collar quickly hopefully. Starting Codeine/Guaifenesin solution for coughing via tube q6hr.  2. Acute on Chronic Hypotension:  Resolved after fluid bolus. Continuing Midodrine 10mg  via tube daily. 3. Paroxysmal Atrial Fibrillation:  Continuing Amiodarone. Continuous telemetry monitoring. Holding Coumadin. Heparin drip  per pharmacy protocol. 4. HCAP/Bacteremia/Sepsis:  Awaiting finalization of cultures. Vancomycin & Cefepime Day #4 & Rifampin Day #3. ID following & guiding antibiotics. Switching to Rifampin & Cefazolin today. 5. ESRD:  On renal replacement therapy as per Nephrology. Tunneled HD Catheter removed today (12/9). 6. Recent Inferior STEMI:  S/P DESx2.  Continuing Plavix & Lipitor. 7. Anxiety:  Weaning Precedex drip. Fentanyl IV as needed for pain. Klonopin VT bid.  8. Leukocytosis:  Improving. Likely secondary to sepsis.  9. GERD:  Protonix via tube BID. 10. Anemia:  Secondary to chronic illness. No signs of bleeding. Trending cell counts daily w/ CBC. Ferric Gluconate IV with dialysis & Aranesp IV Saturdays.  11. DM Type 2:  Glucose uncontrolled. Continuing Accu-Checks q4hr with SSI per Moderate Algorithm. Starting Levemir 8u  qhs. 12.  13. Prophylaxis:  Heparin drip per protocol & Protonix VT bid. SCDs. 14. Diet:  Dietician consulted for tube feedings. Consulted IR for PEG tube placement. Speech consulted. 15. Sedation:  Desired RASS 0 to -1. Precedex drip & scheduled Klonopin bid.  I have spent a total of 32 minutes of critical care time today caring for the patient and reviewing the patient's electronic medical record.  Sonia Baller Ashok Cordia, M.D. [facility] Pulmonary & Critical Care Pager:  [phone] After 3pm or if no response, call [phone] 11/11/2016 3:45 PM

## 2016-11-11 NOTE — Progress Notes (Signed)
ANTICOAGULATION CONSULT NOTE   Pharmacy Consult for heparin Indication: atrial fibrillation  No Known Allergies  Patient Measurements: Height: 5\' 2"  (157.5 cm) Weight: 122 lb 9.2 oz (55.6 kg) IBW/kg (Calculated) : 50.1 Heparin Dosing Weight: 55 kg  Vital Signs: Temp: 98.4 F (36.9 C) (12/09 0826) Temp Source: Oral (12/09 0826) BP: 115/43 (12/09 0900) Pulse Rate: 70 (12/09 0900)  Labs:  Recent Labs  11/09/16 0421  11/09/16 1801 11/10/16 0216 11/11/16 0220  HGB 12.0  < > 11.1* 11.5* 10.1*  HCT 37.9  < > 35.3* 36.1 32.4*  PLT 350  --  305 298 253  LABPROT 17.4*  --   --  16.5* 18.1*  INR 1.41  --   --  1.33 1.48  HEPARINUNFRC 0.40  --   --  0.31 0.33  CREATININE  --   --  2.62*  --  4.35*  < > = values in this interval not displayed.  Estimated Creatinine Clearance: 11 mL/min (by C-G formula based on SCr of 4.35 mg/dL (H)).   Medical History: Past Medical History:  Diagnosis Date  . Diabetes mellitus   . Hyperlipidemia   . Hypertension   . MI, old   . Renal disorder    dialysls    Assessment: 35 yoF that remains on heparin infusion for atrial fibrillation while warfarin on hold. Heparin level remains therapeutic this morning at 0.33 on heparin infusion at 950 units/hr. CBC stable with no reported bleeding.    Goal of Therapy:  Heparin level 0.3-0.7 units/ml Monitor platelets by anticoagulation protocol: Yes   Plan:  1. Continue heparin infusion at 950 units/hr  2. Daily heparin level and CBC  Vincenza Hews, PharmD, BCPS 11/11/2016, 10:19 AM Pager: [phone]

## 2016-11-11 NOTE — Progress Notes (Signed)
Pharmacy Antibiotic Note Deborah [name] is a 60 y.o. female admitted on 11/02/2016.  Currently with MSSA bacteremia and pharmacy asked to transition from Cefepime and vancomycin to Cefazolin for treatment.    Plan: 1. Begin Cefazolin 1 gram IV every 24 hours   Height: 5\' 2"  (157.5 cm) Weight: 123 lb 7.3 oz (56 kg) IBW/kg (Calculated) : 50.1  Temp (24hrs), Avg:97.9 F (36.6 C), Min:97.3 F (36.3 C), Max:98.4 F (36.9 C)   Recent Labs Lab 11/07/16 1110 11/07/16 1902 11/08/16 0433 11/09/16 0421 11/09/16 1801 11/10/16 0216 11/11/16 0220  WBC  --  27.1* 30.5* 24.5* 18.8* 16.9* 17.4*  CREATININE 7.04* 3.16*  --   --  2.62*  --  4.35*    Estimated Creatinine Clearance: 11 mL/min (by C-G formula based on SCr of 4.35 mg/dL (H)).    No Known Allergies  Antimicrobials: Vanco 12/6>>12/3 Cefepime 12/6>> 12/9  Rifampin 12/7 >>  Ancef 12/9 >>    Microbiology results:  11/29 UCx: mult species - recollect 12/1 MRSA PCR: positive 12/5 bC x 2: BCID 1/2 MSSA 12/7 BCx: ngtd  Thank you for allowing pharmacy to be a part of this patient's care.  Vincenza Hews, PharmD, BCPS 11/11/2016, 2:28 PM Pager: [phone]

## 2016-11-11 NOTE — Progress Notes (Signed)
       [facility]  Date of Admission:  11/02/2016           Day 4 vancomycin        Day 4 cefepime        Day 3 rifampin  Active Problems:   Mitral regurgitation   S/P MVR (mitral valve replacement)   HCAP (healthcare-associated pneumonia)   Diabetes mellitus type 2 in nonobese ([name])   ESRD on dialysis ([name])   Anemia of chronic disease   Atrial flutter ([name])   Respiratory distress   Vocal cord paralysis   Bacteremia   . Darbepoetin Alfa      . amiodarone  200 mg Per Tube Daily  . atorvastatin  20 mg Per Tube q1800  . ceFEPime (MAXIPIME) IV  1 g Intravenous Q24H  . chlorhexidine gluconate (MEDLINE KIT)  15 mL Mouth Rinse BID  . clonazePAM  0.5 mg Per Tube BID  . clopidogrel  75 mg Per Tube Daily  . darbepoetin (ARANESP) injection - DIALYSIS  60 mcg Intravenous Q Sat-HD  . ferric gluconate (FERRLECIT/NULECIT) IV  125 mg Intravenous Q T,Th,Sa-HD  . insulin aspart  0-15 Units Subcutaneous Q4H  . mouth rinse  15 mL Mouth Rinse QID  . metoCLOPramide  10 mg Per Tube BID  . midodrine  10 mg Per Tube q morning - 10a  . multivitamin  1 tablet Oral QHS  . pantoprazole sodium  40 mg Per Tube Daily  . rifampin  300 mg Per Tube Q8H  . sodium chloride flush  3 mL Intravenous Q12H  . vancomycin  500 mg Intravenous Q T,Th,Sa-HD   Review of Systems: Review of Systems  Unable to perform ROS: Intubated    Past Medical History:  Diagnosis Date  . Diabetes mellitus   . Hyperlipidemia   . Hypertension   . MI, old   . Renal disorder    dialysls    Social History  Substance Use Topics  . Smoking status: Never Smoker  . Smokeless tobacco: Never Used  . Alcohol use No    Family History  Problem Relation Age of Onset  . Hypertension Mother   . Diabetes Mother   . Breast cancer Sister    No Known Allergies  OBJECTIVE: Vitals:   11/11/16 1300 11/11/16 1320 11/11/16 1325 11/11/16 1400  BP: (!) 115/37 (!) 128/26 (!) 115/55 (!) 129/56  Pulse:  80 83 85 76  Resp: 19 18 19 18  Temp: 97.4 F (36.3 C)     TempSrc: Oral     SpO2: 99% 99% 100% 99%  Weight: 123 lb 7.3 oz (56 kg)     Height:       Body mass index is 22.58 kg/m.  Physical Exam  Constitutional:  She is alert.  Cardiovascular: Normal rate and regular rhythm.   No murmur heard. Pulmonary/Chest: Effort normal and breath sounds normal. She has no wheezes. She has no rales.  Abdominal: Soft. There is no tenderness.  Neurological: She is alert.  Skin: No rash noted.    Lab Results Lab Results  Component Value Date   WBC 17.4 (H) 11/11/2016   HGB 10.1 (L) 11/11/2016   HCT 32.4 (L) 11/11/2016   MCV 88.3 11/11/2016   PLT 253 11/11/2016    Lab Results  Component Value Date   CREATININE 4.35 (H) 11/11/2016   BUN 46 (H) 11/11/2016   NA 136 11/11/2016   K 3.8 11/11/2016     CL 102 11/11/2016   CO2 21 (L) 11/11/2016    Lab Results  Component Value Date   ALT 21 11/07/2016   AST 22 11/07/2016   ALKPHOS 69 11/07/2016   BILITOT 0.9 11/07/2016     Microbiology: Recent Results (from the past 240 hour(s))  MRSA PCR Screening     Status: Abnormal   Collection Time: 11/03/16  5:42 AM  Result Value Ref Range Status   MRSA by PCR POSITIVE (A) NEGATIVE Final    Comment:        The GeneXpert MRSA Assay (FDA approved for NASAL specimens only), is one component of a comprehensive MRSA colonization surveillance program. It is not intended to diagnose MRSA infection nor to guide or monitor treatment for MRSA infections. RESULT CALLED TO, READ BACK BY AND VERIFIED WITH: M.SCHMIT RN AT [phone] 11/03/16 BY A.DAVIS   Culture, blood (Routine X 2) w Reflex to ID Panel     Status: Abnormal   Collection Time: 11/07/16  7:02 PM  Result Value Ref Range Status   Specimen Description BLOOD RIGHT ANTECUBITAL  Final   Special Requests IN PEDIATRIC BOTTLE 2CC  Final   Culture  Setup Time   Final    AEROBIC BOTTLE ONLY GRAM POSITIVE COCCI Organism ID to follow CRITICAL  RESULT CALLED TO, READ BACK BY AND VERIFIED WITH: JAMES LEDFORD, PHARMD _0  11/08/16 MKELLY,MLT    Culture STAPHYLOCOCCUS AUREUS (A)  Final   Report Status 11/11/2016 FINAL  Final   Organism ID, Bacteria STAPHYLOCOCCUS AUREUS  Final      Susceptibility   Staphylococcus aureus - MIC*    CIPROFLOXACIN 2 INTERMEDIATE Intermediate     ERYTHROMYCIN >=8 RESISTANT Resistant     GENTAMICIN <=0.5 SENSITIVE Sensitive     OXACILLIN 0.5 SENSITIVE Sensitive     TETRACYCLINE <=1 SENSITIVE Sensitive     VANCOMYCIN <=0.5 SENSITIVE Sensitive     TRIMETH/SULFA <=10 SENSITIVE Sensitive     CLINDAMYCIN <=0.25 RESISTANT Resistant     RIFAMPIN <=0.5 SENSITIVE Sensitive     Inducible Clindamycin POSITIVE Resistant     * STAPHYLOCOCCUS AUREUS  Blood Culture ID Panel (Reflexed)     Status: Abnormal   Collection Time: 11/07/16  7:02 PM  Result Value Ref Range Status   Enterococcus species NOT DETECTED NOT DETECTED Final   Listeria monocytogenes NOT DETECTED NOT DETECTED Final   Staphylococcus species DETECTED (A) NOT DETECTED Final    Comment: CRITICAL RESULT CALLED TO, READ BACK BY AND VERIFIED WITH: JAMES LEDFORD, PHARMD _1  11/08/16 MKELLY,MLT    Staphylococcus aureus DETECTED (A) NOT DETECTED Final    Comment: CRITICAL RESULT CALLED TO, READ BACK BY AND VERIFIED WITH: JAMES LEDFORD, PHARMD _2  11/08/16 MKELLY,MLT    Methicillin resistance NOT DETECTED NOT DETECTED Final   Streptococcus species NOT DETECTED NOT DETECTED Final   Streptococcus agalactiae NOT DETECTED NOT DETECTED Final   Streptococcus pneumoniae NOT DETECTED NOT DETECTED Final   Streptococcus pyogenes NOT DETECTED NOT DETECTED Final   Acinetobacter baumannii NOT DETECTED NOT DETECTED Final   Enterobacteriaceae species NOT DETECTED NOT DETECTED Final   Enterobacter cloacae complex NOT DETECTED NOT DETECTED Final   Escherichia coli NOT DETECTED NOT DETECTED Final   Klebsiella oxytoca NOT DETECTED NOT DETECTED Final   Klebsiella  pneumoniae NOT DETECTED NOT DETECTED Final   Proteus species NOT DETECTED NOT DETECTED Final   Serratia marcescens NOT DETECTED NOT DETECTED Final   Haemophilus influenzae NOT DETECTED NOT DETECTED Final   Neisseria meningitidis NOT DETECTED NOT  DETECTED Final   Pseudomonas aeruginosa NOT DETECTED NOT DETECTED Final   Candida albicans NOT DETECTED NOT DETECTED Final   Candida glabrata NOT DETECTED NOT DETECTED Final   Candida krusei NOT DETECTED NOT DETECTED Final   Candida parapsilosis NOT DETECTED NOT DETECTED Final   Candida tropicalis NOT DETECTED NOT DETECTED Final  Culture, blood (Routine X 2) w Reflex to ID Panel     Status: Abnormal   Collection Time: 11/07/16  7:08 PM  Result Value Ref Range Status   Specimen Description BLOOD RIGHT HAND  Final   Special Requests BOTTLES DRAWN AEROBIC ONLY 5CC  Final   Culture  Setup Time   Final    GRAM POSITIVE COCCI IN CLUSTERS AEROBIC BOTTLE ONLY CRITICAL VALUE NOTED.  VALUE IS CONSISTENT WITH PREVIOUSLY REPORTED AND CALLED VALUE.    Culture (A)  Final    STAPHYLOCOCCUS AUREUS SUSCEPTIBILITIES PERFORMED ON PREVIOUS CULTURE WITHIN THE LAST 5 DAYS.    Report Status 11/11/2016 FINAL  Final  Culture, blood (Routine X 2) w Reflex to ID Panel     Status: None (Preliminary result)   Collection Time: 11/09/16  6:05 PM  Result Value Ref Range Status   Specimen Description BLOOD RIGHT HAND  Final   Special Requests BOTTLES DRAWN AEROBIC ONLY 6CC  Final   Culture NO GROWTH < 24 HOURS  Final   Report Status PENDING  Incomplete     ASSESSMENT: She recently developed leukocytosis, fever and left lower lobe infiltrates. 2 of 2 blood cultures are growing MSSA. Repeat blood cultures and TEE are pending. I will narrow antibiotic therapy to cefazolin and rifampin.  PLAN: 1. Narrow antibiotic therapy to cefazolin and rifampin 2. Await results of repeat blood cultures and TEE   , MD [facility] [phone] pager   [phone] cell 11/11/2016, 2:14 PMPatient ID: Deborah [name], female   DOB: 05/30/1956, 60 y.o.   MRN: [id number]  

## 2016-11-11 NOTE — Progress Notes (Signed)
SLP Cancellation Note  Patient Details Name: Deborah [name] MRN: [id number] DOB: 09-12-1956   Cancelled treatment:       Reason Eval/Treat Not Completed: Medical issues which prohibited therapy. Patient continues to remain on vent. Per notes, likely chronic weaning process. If patient alert and attempting to communicate, in-line PMSV may be beneficial at this time to facilitate communication and aid in weaning process. Holding swallow evaluation order. MD,  please order PMSV if agree.   [place], CCC-SLP [phone]  Deborah [name] 11/11/2016, 9:11 AM

## 2016-11-12 DIAGNOSIS — B9561 Methicillin susceptible Staphylococcus aureus infection as the cause of diseases classified elsewhere: Secondary | ICD-10-CM | POA: Diagnosis present

## 2016-11-12 DIAGNOSIS — R7881 Bacteremia: Secondary | ICD-10-CM | POA: Diagnosis present

## 2016-11-12 LAB — CBC
HCT: 34 % — ABNORMAL LOW (ref 36.0–46.0)
Hemoglobin: 10.6 g/dL — ABNORMAL LOW (ref 12.0–15.0)
MCH: 27.2 pg (ref 26.0–34.0)
MCHC: 31.2 g/dL (ref 30.0–36.0)
MCV: 87.2 fL (ref 78.0–100.0)
PLATELETS: 251 10*3/uL (ref 150–400)
RBC: 3.9 MIL/uL (ref 3.87–5.11)
RDW: 18.7 % — AB (ref 11.5–15.5)
WBC: 18.7 10*3/uL — AB (ref 4.0–10.5)

## 2016-11-12 LAB — MAGNESIUM: Magnesium: 1.7 mg/dL (ref 1.7–2.4)

## 2016-11-12 LAB — RENAL FUNCTION PANEL
ALBUMIN: 2.1 g/dL — AB (ref 3.5–5.0)
Anion gap: 10 (ref 5–15)
BUN: 19 mg/dL (ref 6–20)
CO2: 30 mmol/L (ref 22–32)
Calcium: 8.6 mg/dL — ABNORMAL LOW (ref 8.9–10.3)
Chloride: 95 mmol/L — ABNORMAL LOW (ref 101–111)
Creatinine, Ser: 3.03 mg/dL — ABNORMAL HIGH (ref 0.44–1.00)
GFR calc Af Amer: 18 mL/min — ABNORMAL LOW (ref >60)
GFR calc non Af Amer: 16 mL/min — ABNORMAL LOW (ref >60)
GLUCOSE: 148 mg/dL — AB (ref 65–99)
PHOSPHORUS: 2 mg/dL — AB (ref 2.5–4.6)
POTASSIUM: 3.1 mmol/L — AB (ref 3.5–5.1)
SODIUM: 135 mmol/L (ref 135–145)

## 2016-11-12 LAB — HEPARIN LEVEL (UNFRACTIONATED): Heparin Unfractionated: <0.1 IU/mL — ABNORMAL LOW (ref 0.30–0.70)

## 2016-11-12 LAB — GLUCOSE, CAPILLARY
GLUCOSE-CAPILLARY: 106 mg/dL — AB (ref 65–99)
GLUCOSE-CAPILLARY: 145 mg/dL — AB (ref 65–99)
Glucose-Capillary: 165 mg/dL — ABNORMAL HIGH (ref 65–99)
Glucose-Capillary: 155 mg/dL — ABNORMAL HIGH (ref 65–99)
Glucose-Capillary: 145 mg/dL — ABNORMAL HIGH (ref 65–99)

## 2016-11-12 LAB — PROTIME-INR
INR: 1.45
Prothrombin Time: 17.7 seconds — ABNORMAL HIGH (ref 11.4–15.2)

## 2016-11-12 MED ORDER — ONDANSETRON HCL 4 MG/2ML IJ SOLN
4.0000 mg | Freq: Four times a day (QID) | INTRAMUSCULAR | Status: DC | PRN
  Administered 2016-11-13 – 2016-11-23 (×7): 4 mg via INTRAVENOUS
  Filled 2016-11-12 (×7): qty 2

## 2016-11-12 NOTE — Progress Notes (Signed)
Patient ID: Deborah [name], female   DOB: 1956/04/13, 60 y.o.   MRN: [id number]  [facility] Progress Note   Assessment/ Plan:   1. Cough/dyspnea/respiratory failure-status post tracheostomy 12/7 for vocal cord paralysis, currently on ventilator support and anticipated to have weaning and transition to tracheostomy collar when permitted. 2. ESRD: Continue hemodialysis on a Tuesday/Thursday/Saturday schedule with dialysis done yesterday without problems. Appreciate assistance from vascular surgery in removing her dialysis catheter for MSSA bacteremia/sepsis management. Mature LUA AVG. 3. Anemia: Hemoglobin levels acceptable, continue to monitor status post tracheostomy for additional need to titrate ESA 4. CKD-MBD: Currently nothing by mouth while intubated, binders on hold, not on VDRA with suppressed PTH 5. Nutrition: Appears to be frail and gradually failing to thrive with malnutrition-continue nutritional supplementation 6. HCAP/MSSA bacteremia with sepsis: On rifampin and cefazolin after previously on vancomycin/cefepime and rifampin. Afebrile appears to be hemodynamically stable.  Subjective:   Appears to be comfortable and denies any complaints. No acute events noted from overnight.    Objective:   BP (!) 149/71   Pulse 94   Temp 98.7 F (37.1 C) (Oral)   Resp 16   Ht 5' 2" (1.575 m)   Wt 54.9 kg (121 lb 0.5 oz)   SpO2 100%   BMI 22.14 kg/m   Physical Exam: Gen: On ventilator via tracheostomy, awake and responds to questions CVS: Pulse regular rhythm, normal rate, S1 and S2 normal Resp: Anteriorly clear to auscultation, no rales/rhonchi Abd: Soft, flat, nontender Ext: No lower extremity edema. Left upper arm AVG with good thrill/bruit. Left IJ [facility]  Labs: BMET  Recent Labs Lab 11/07/16 1110 11/07/16 1902 11/09/16 1426 11/09/16 1801 11/11/16 0220 11/12/16 0235  NA 128* 134* 135 135 136 135  K 3.9 3.2* 4.1 4.2 3.8 3.1*  CL 91* 93*  --  100* 102 95*  CO2  22 25  --  18* 21* 30  GLUCOSE 388* 147* 103* 139* 198* 148*  BUN 70* 20  --  18 46* 19  CREATININE 7.04* 3.16*  --  2.62* 4.35* 3.03*  CALCIUM 9.0 9.2  --  8.6* 8.4* 8.6*  PHOS 3.7  --   --  3.5 3.3 2.0*   CBC  Recent Labs Lab 11/09/16 1801 11/10/16 0216 11/11/16 0220 11/12/16 0235  WBC 18.8* 16.9* 17.4* 18.7*  HGB 11.1* 11.5* 10.1* 10.6*  HCT 35.3* 36.1 32.4* 34.0*  MCV 88.7 88.3 88.3 87.2  PLT 305 298 253 251   Medications:    . amiodarone  200 mg Per Tube Daily  . atorvastatin  20 mg Per Tube q1800  .  ceFAZolin (ANCEF) IV  1 g Intravenous Q24H  . chlorhexidine gluconate (MEDLINE KIT)  15 mL Mouth Rinse BID  . clonazePAM  0.5 mg Per Tube BID  . clopidogrel  75 mg Per Tube Daily  . darbepoetin (ARANESP) injection - DIALYSIS  60 mcg Intravenous Q Sat-HD  . docusate  100 mg Per Tube BID  . ferric gluconate (FERRLECIT/NULECIT) IV  125 mg Intravenous Q T,Th,Sa-HD  . guaiFENesin-codeine  5 mL Per Tube Q6H  . insulin aspart  0-15 Units Subcutaneous Q4H  . insulin detemir  8 Units Subcutaneous QHS  . mouth rinse  15 mL Mouth Rinse QID  . metoCLOPramide  10 mg Per Tube BID  . midodrine  10 mg Per Tube q morning - 10a  . multivitamin  1 tablet Oral QHS  . pantoprazole sodium  40 mg Per Tube Daily  .  rifampin  300 mg Per Tube Q8H  . sennosides  5 mL Per Tube BID  . sodium chloride flush  3 mL Intravenous Q12H   Elmarie Shiley, MD 11/12/2016, 8:15 AM

## 2016-11-12 NOTE — Progress Notes (Signed)
Name: Deborah [name] MRN: [id number] DOB: 09/24/1956    ADMISSION DATE:  11/02/2016 CONSULTATION DATE:  11/02/2016  REFERRING MD :  Dr. Regenia Skeeter EDP  CHIEF COMPLAINT:  SOB  BRIEF PATIENT DESCRIPTION: 60 year old female with recent prolonged hospitalization secondary to papillary muscle rupture and subsequent HCAP. She was intubated requiring trach. Decannulated in the end of October. She then went to [facility] and was discharged 11/29. No presenting to [facility] ED 11/30 with compalints of SOB x1-2 weeks. She was seen by ENT they found cords were not opening from either scarring, inflammation, or nerve issue. They were hoping she would improve in the coming days, however, she did not and was taken for tracheostomy 12/7.  SIGNIFICANT EVENTS  09/12 Admit to [facility] 09/15 To [facility] 09/20 MVR with bioprosthetic valve 09/28 Permcath for HD 09/29 Extubated 09/30 Reintubated 10/05 Trach by Hyman Bible 10/09 Off vent 10/25 decannulated 11/8 to CIR 11/29 DC CIR 11/30 admit for resp distress 12/09 L IJ Tunneled HD catheter removed  STUDIES:   SUBJECTIVE:  Patient did have some mild emesis overnight. Denies any abdominal pain or nausea at this time. Denies any chest pain or difficulty breathing at this time.  REVIEW OF SYSTEMS:  Unable to obtain with ventilator requirement.   VITAL SIGNS: Temp:  [97.9 F (36.6 C)-99.4 F (37.4 C)] 98.7 F (37.1 C) (12/10 1223) Pulse Rate:  [86-113] 87 (12/10 1300) Resp:  [12-33] 14 (12/10 1300) BP: (101-173)/(30-115) 123/61 (12/10 1300) SpO2:  [96 %-100 %] 99 % (12/10 1300) FiO2 (%):  [40 %] 40 % (12/10 1141) Weight:  [119 lb 0.8 oz (54 kg)-121 lb 0.5 oz (54.9 kg)] 121 lb 0.5 oz (54.9 kg) (12/10 0400)  PHYSICAL EXAMINATION: General:  Elderly female. Eyes closed but opens them easily. Family at bedside. Neuro:  Follows commands. No meningismus. Tends to voice. Nods to questions. HEENT:  Moist membranes. No scleral injection. No further bleeding around tracheostomy  stoma. Cardiovascular:  Regular rate. No edema or JVD. Pulmonary:  Good aeration bilaterally. Symmetric chest wall expansion on ventilator. Tolerating pressure support wean today. Abdomen:  Soft. Nondistended. Normoactive bowel sounds. Integument:  Warm & dry. No rash on exposed skin.    Recent Labs Lab 11/09/16 1801 11/11/16 0220 11/12/16 0235  NA 135 136 135  K 4.2 3.8 3.1*  CL 100* 102 95*  CO2 18* 21* 30  BUN 18 46* 19  CREATININE 2.62* 4.35* 3.03*  GLUCOSE 139* 198* 148*    Recent Labs Lab 11/10/16 0216 11/11/16 0220 11/12/16 0235  HGB 11.5* 10.1* 10.6*  HCT 36.1 32.4* 34.0*  WBC 16.9* 17.4* 18.7*  PLT 298 253 251   No results found. MICROBIOLOGY: Urine Ctx 11/27:  Multiple species MRSA PCR 12/1:  Positive Blood Ctx x2 12/5:  2/2 MSSA Blood Ctx x1 12/7 >> Catheter Tip Culture 12/9 >>  ANTIBIOTICS: Cefepime 12/6 - 12/9 Vancomycin 12/6 - 12/9 Rifapmin 12/7 >> Cefazolin 12/9 >>  LINES/TUBES: Tracheostomy 6.4 cuffed [name]) 12/7 >> PIV  ASSESSMENT / PLAN:  60 y.o. female with Vocal cord paralysis status post repeat tracheostomy. Patient with acute on chronic hypertension resolved after fluid bolus. Continuing on broad-spectrum antibiotics for sepsis secondary to bacteremia/healthcare associated pneumonia per ID recommendations.  1. Vocal Cord Paralysis:  S/P repeat trach by ENT on 12/7. Tracheostomy care per ENT. Continuing ventilator wean & transition to tracheostomy collar quickly hopefully. Codeine/Guaifenesin liquid for coughing via tube q6hr scheduled. Mild bleeding around stoma 12/9 therefore holding Heparin. 2. Acute on  Chronic Hypotension:  Resolved after fluid bolus. Continuing Midodrine 10mg  via tube daily. 3. Paroxysmal Atrial Fibrillation:  Continuing Amiodarone. Continuous telemetry monitoring. Holding Coumadin. Holding Heparin drip. 4. HCAP/Bacteremia/Sepsis:  Awaiting finalization of repeat culture 12/7 ID following & guiding antibiotics:  Rifampin & Cefazolin. 5. ESRD:  On renal replacement therapy as per Nephrology. Tunneled HD Catheter removed 12/9. 6. Recent Inferior STEMI:  S/P DESx2.  Continuing Plavix & Lipitor. 7. Anxiety:  Weaning Precedex drip. Fentanyl IV as needed for pain. Klonopin VT bid.  8. Leukocytosis:  Stable. Multifactorial.  9. GERD:  Protonix via tube BID. 10. Anemia:  Secondary to chronic illness. No signs of bleeding. Trending cell counts daily w/ CBC. Ferric Gluconate IV with dialysis & Aranesp IV Saturdays.  11. DM Type 2:  Glucose uncontrolled. Continuing Accu-Checks q4hr with SSI per Moderate Algorithm. Levemir 8u [place] qhs. 12. Prophylaxis:  Protonix VT bid & SCDs. 13. Diet:  Dietician consulted for tube feedings. Consulted IR for PEG tube placement & family made aware. Speech consulted. 14. Sedation:  Desired RASS 0 to -1. Precedex drip & scheduled Klonopin bid.  Family:  Multiple family members updated 12/10 by Dr. Ashok Cordia.  I have spent a total of 31 minutes of critical care time today caring for the patient, updating family, and reviewing the patient's electronic medical record.  Sonia Baller Ashok Cordia, M.D. [facility] Pager:  [phone] After 3pm or if no response, call [phone] 11/12/2016 2:14 PM

## 2016-11-12 NOTE — Progress Notes (Signed)
ANTICOAGULATION CONSULT NOTE   Pharmacy Consult for heparin Indication: atrial fibrillation  No Known Allergies  Patient Measurements: Height: 5\' 2"  (157.5 cm) Weight: 121 lb 0.5 oz (54.9 kg) IBW/kg (Calculated) : 50.1 Heparin Dosing Weight: 55 kg  Vital Signs: Temp: 98.7 F (37.1 C) (12/10 1223) Temp Source: Oral (12/10 1223) BP: 109/57 (12/10 1400) Pulse Rate: 83 (12/10 1400)  Labs:  Recent Labs  11/09/16 1801 11/10/16 0216 11/11/16 0220 11/12/16 0235  HGB 11.1* 11.5* 10.1* 10.6*  HCT 35.3* 36.1 32.4* 34.0*  PLT 305 298 253 251  LABPROT  --  16.5* 18.1* 17.7*  INR  --  1.33 1.48 1.45  HEPARINUNFRC  --  0.31 0.33 <0.10*  CREATININE 2.62*  --  4.35* 3.03*    Estimated Creatinine Clearance: 15.8 mL/min (by C-G formula based on SCr of 3.03 mg/dL (H)).   Medical History: Past Medical History:  Diagnosis Date  . Diabetes mellitus   . Hyperlipidemia   . Hypertension   . MI, old   . Renal disorder    dialysls    Assessment: 89 yoF with atrial fibrillation on warfarin PTA that is currently on hold.  Heparin infusion turned off on 12/9 pm do to bleeding around trach that has since stopped.  CBC remains stable.  Per CCM continue to hold heparin for today and assess resuming after g-tube placement on 12/11.  Goal of Therapy:  Heparin level 0.3-0.7 units/ml Monitor platelets by anticoagulation protocol: Yes   Plan:  1. Follow up on anticoagulation plans post g- tube placement.   Vincenza Hews, PharmD, BCPS 11/12/2016, 2:27 PM Pager: [phone]

## 2016-11-12 NOTE — Progress Notes (Signed)
Patient ID: Deborah [name], female   DOB: 15-Mar-1956, 60 y.o.   MRN: [id number]          [facility]    Date of Admission:  11/02/2016   Total days of antibiotics 5         She has MSSA bacteremia and pneumonia. She is afebrile. Repeat blood cultures on 11/09/2016 are negative. I recommend TEE given her history of mitral valve replacement in September. I will continue cefazolin and rifampin for now.        Michel Bickers, MD [facility] [place] [facility] [phone] pager   [phone] cell 12/07/2015, 1:32 PM

## 2016-11-12 NOTE — Progress Notes (Signed)
SLP Cancellation Note  Patient Details Name: Deborah [name] MRN: [id number] DOB: [birth year]   Cancelled treatment:       Reason Eval/Treat Not Completed: Medical issues which prohibited therapy. Pt remains on the vent. If appropriate, consider in-line PMV evaluation. Will continue to hold swallow evaluation.   Germain Osgood 11/12/2016, 7:59 AM  Germain Osgood, M.A. CCC-SLP [phone]

## 2016-11-13 ENCOUNTER — Inpatient Hospital Stay (HOSPITAL_COMMUNITY): Payer: Medicaid Other

## 2016-11-13 ENCOUNTER — Encounter (HOSPITAL_COMMUNITY): Payer: Self-pay | Admitting: Interventional Radiology

## 2016-11-13 DIAGNOSIS — R7881 Bacteremia: Secondary | ICD-10-CM

## 2016-11-13 HISTORY — PX: IR GENERIC HISTORICAL: IMG1180011

## 2016-11-13 LAB — RENAL FUNCTION PANEL
ALBUMIN: 2.1 g/dL — AB (ref 3.5–5.0)
Anion gap: 15 (ref 5–15)
BUN: 26 mg/dL — ABNORMAL HIGH (ref 6–20)
CALCIUM: 8.7 mg/dL — AB (ref 8.9–10.3)
CO2: 23 mmol/L (ref 22–32)
CREATININE: 4.44 mg/dL — AB (ref 0.44–1.00)
Chloride: 98 mmol/L — ABNORMAL LOW (ref 101–111)
GFR calc non Af Amer: 10 mL/min — ABNORMAL LOW (ref >60)
GFR, EST AFRICAN AMERICAN: 12 mL/min — AB (ref >60)
Glucose, Bld: 46 mg/dL — ABNORMAL LOW (ref 65–99)
Phosphorus: 1.7 mg/dL — ABNORMAL LOW (ref 2.5–4.6)
Potassium: 3.8 mmol/L (ref 3.5–5.1)
SODIUM: 136 mmol/L (ref 135–145)

## 2016-11-13 LAB — GLUCOSE, CAPILLARY
GLUCOSE-CAPILLARY: 43 mg/dL — AB (ref 65–99)
GLUCOSE-CAPILLARY: 123 mg/dL — AB (ref 65–99)
GLUCOSE-CAPILLARY: 58 mg/dL — AB (ref 65–99)
GLUCOSE-CAPILLARY: 174 mg/dL — AB (ref 65–99)
GLUCOSE-CAPILLARY: 82 mg/dL (ref 65–99)
Glucose-Capillary: 190 mg/dL — ABNORMAL HIGH (ref 65–99)
Glucose-Capillary: 197 mg/dL — ABNORMAL HIGH (ref 65–99)
Glucose-Capillary: 214 mg/dL — ABNORMAL HIGH (ref 65–99)
Glucose-Capillary: 226 mg/dL — ABNORMAL HIGH (ref 65–99)
Glucose-Capillary: 56 mg/dL — ABNORMAL LOW (ref 65–99)
Glucose-Capillary: 92 mg/dL (ref 65–99)

## 2016-11-13 LAB — CBC
HCT: 34 % — ABNORMAL LOW (ref 36.0–46.0)
HEMOGLOBIN: 10.8 g/dL — AB (ref 12.0–15.0)
MCH: 27.3 pg (ref 26.0–34.0)
MCHC: 31.8 g/dL (ref 30.0–36.0)
MCV: 86.1 fL (ref 78.0–100.0)
Platelets: 297 10*3/uL (ref 150–400)
RBC: 3.95 MIL/uL (ref 3.87–5.11)
RDW: 18.2 % — ABNORMAL HIGH (ref 11.5–15.5)
WBC: 15.6 10*3/uL — AB (ref 4.0–10.5)

## 2016-11-13 LAB — PROTIME-INR
INR: 1.34
PROTHROMBIN TIME: 16.6 s — AB (ref 11.4–15.2)

## 2016-11-13 LAB — MAGNESIUM: MAGNESIUM: 1.7 mg/dL (ref 1.7–2.4)

## 2016-11-13 LAB — GLUCOSE, RANDOM: GLUCOSE: 494 mg/dL — AB (ref 65–99)

## 2016-11-13 MED ORDER — CEFAZOLIN SODIUM-DEXTROSE 2-4 GM/100ML-% IV SOLN
INTRAVENOUS | Status: AC
  Administered 2016-11-13: 2 g
  Filled 2016-11-13: qty 100

## 2016-11-13 MED ORDER — FENTANYL CITRATE (PF) 100 MCG/2ML IJ SOLN
INTRAMUSCULAR | Status: AC | PRN
  Administered 2016-11-13 (×2): 50 ug via INTRAVENOUS

## 2016-11-13 MED ORDER — MIDAZOLAM HCL 2 MG/2ML IJ SOLN
INTRAMUSCULAR | Status: AC
  Filled 2016-11-13: qty 4

## 2016-11-13 MED ORDER — DEXTROSE 50 % IV SOLN
INTRAVENOUS | Status: AC
  Administered 2016-11-13: 50 mL
  Filled 2016-11-13: qty 50

## 2016-11-13 MED ORDER — SODIUM CHLORIDE 0.9 % IV SOLN
INTRAVENOUS | Status: DC
  Administered 2016-11-16 – 2016-11-18 (×2): via INTRAVENOUS

## 2016-11-13 MED ORDER — LIDOCAINE HCL 1 % IJ SOLN
INTRAMUSCULAR | Status: AC | PRN
  Administered 2016-11-13: 10 mL

## 2016-11-13 MED ORDER — IOPAMIDOL (ISOVUE-300) INJECTION 61%
INTRAVENOUS | Status: AC
  Filled 2016-11-13: qty 50

## 2016-11-13 MED ORDER — DEXTROSE 50 % IV SOLN
INTRAVENOUS | Status: AC
  Administered 2016-11-13: 25 mL
  Filled 2016-11-13: qty 50

## 2016-11-13 MED ORDER — FENTANYL CITRATE (PF) 100 MCG/2ML IJ SOLN
INTRAMUSCULAR | Status: AC
  Filled 2016-11-13: qty 4

## 2016-11-13 MED ORDER — HEPARIN (PORCINE) IN NACL 100-0.45 UNIT/ML-% IJ SOLN
950.0000 [IU]/h | INTRAMUSCULAR | Status: DC
  Administered 2016-11-13 – 2016-11-15 (×3): 950 [IU]/h via INTRAVENOUS
  Filled 2016-11-13 (×4): qty 250

## 2016-11-13 MED ORDER — MIDAZOLAM HCL 2 MG/2ML IJ SOLN
INTRAMUSCULAR | Status: AC | PRN
  Administered 2016-11-13 (×2): 1 mg via INTRAVENOUS

## 2016-11-13 MED ORDER — IOPAMIDOL (ISOVUE-300) INJECTION 61%
INTRAVENOUS | Status: AC
  Administered 2016-11-13: 15 mL
  Filled 2016-11-13: qty 50

## 2016-11-13 MED ORDER — LIDOCAINE HCL 1 % IJ SOLN
INTRAMUSCULAR | Status: AC
  Filled 2016-11-13: qty 20

## 2016-11-13 MED ORDER — SODIUM CHLORIDE 0.9 % IV SOLN
INTRAVENOUS | Status: AC | PRN
  Administered 2016-11-13: 30 mL/h via INTRAVENOUS

## 2016-11-13 MED ORDER — GLUCOSE 40 % PO GEL
ORAL | Status: AC
  Filled 2016-11-13: qty 1

## 2016-11-13 MED ORDER — GLUCOSE 40 % PO GEL
1.0000 | Freq: Once | ORAL | Status: AC
  Administered 2016-11-13: 37.5 g via ORAL

## 2016-11-13 NOTE — Progress Notes (Signed)
Rt Note: pt transported to IR and back to 15M on vent, no compications.  Pt placed on full support for transport.

## 2016-11-13 NOTE — Progress Notes (Signed)
Name: Deborah [name] MRN: [id number] DOB: Jul 17, 1956    ADMISSION DATE:  11/02/2016 CONSULTATION DATE:  11/02/2016  REFERRING MD :  Dr. Regenia Skeeter EDP  CHIEF COMPLAINT:  SOB  BRIEF PATIENT DESCRIPTION: 60 year old female ESRD 10/3/0/17 with recent prolonged hospitalization secondary to papillary muscle rupture and subsequent HCAP. She was intubated requiring trach. Decannulated in the end of October. She then went to [facility] and was discharged 11/29. No presenting to [facility] ED 11/30 with compalints of SOB x1-2 weeks. She was seen by ENT they found cords were not opening from either scarring, inflammation, or nerve issue. They were hoping she would improve in the coming days, however, she did not and was taken for tracheostomy 12/7.  SIGNIFICANT EVENTS  09/12 Admit to [facility] 09/15 To [facility] 09/20 MVR with bioprosthetic valve 09/28 Permcath for HD 09/29 Extubated 09/30 Reintubated 10/05 Trach by Hyman Bible 10/09 Off vent 10/25 decannulated 11/8 to CIR 11/29 DC CIR 11/30 admit for resp distress 127 - redo trach Tracheostomy 6.4 cuffed [name]) 12/7 >> 12/09 L IJ Tunneled HD catheter removed . LUE graft being used 12/10 -  Patient did have some mild emesis overnight. Denies any abdominal pain or nausea at this time. Denies any chest pain or difficulty breathing at this time.    SUBJECTIVE/OVERNIGHT/INTERVAL HX 12/11  - not on pressors.Not on sedation gtt. On trach .Did PSV15/5 but unable to get to trach collar. PEG pending 11/13/2016 .Hardd stick for PIV per RN  VITAL SIGNS: Temp:  [97.4 F (36.3 C)-98.7 F (37.1 C)] 98.1 F (36.7 C) (12/11 0750) Pulse Rate:  [72-91] 78 (12/11 1000) Resp:  [11-21] 17 (12/11 1000) BP: (98-157)/(46-69) 155/53 (12/11 1000) SpO2:  [98 %-100 %] 99 % (12/11 1000) FiO2 (%):  [40 %] 40 % (12/11 1000) Weight:  [54.6 kg (120 lb 5.9 oz)] 54.6 kg (120 lb 5.9 oz) (12/11 0400)  PHYSICAL EXAMINATION: General:  Elderly female. Eyes closed but opens them easily. Neuro:  Follows  commands. No meningismus. Tends to voice. Nods to questions. HEENT:  Moist membranes. No scleral injection. No further bleeding around tracheostomy stoma. Cardiovascular:  Regular rate. No edema or JVD. Pulmonary:  Good aeration bilaterally. Symmetric chest wall expansion on ventilator. Tolerating pressure support wean today. Abdomen:  Soft. Nondistended. Normoactive bowel sounds. Integument:  Warm & dry. No rash on exposed skin.   PULMONARY  Recent Labs Lab 11/07/16 1841 11/09/16 2346  PHART 7.522* 7.386  PCO2ART 33.2 33.8  PO2ART 58.6* 97.0  HCO3 27.1 20.0  TCO2  --  21  O2SAT 91.2 97.0    CBC  Recent Labs Lab 11/11/16 0220 11/12/16 0235 11/13/16 0300  HGB 10.1* 10.6* 10.8*  HCT 32.4* 34.0* 34.0*  WBC 17.4* 18.7* 15.6*  PLT 253 251 297    COAGULATION  Recent Labs Lab 11/09/16 0421 11/10/16 0216 11/11/16 0220 11/12/16 0235 11/13/16 0300  INR 1.41 1.33 1.48 1.45 1.34    CARDIAC   Recent Labs Lab 11/07/16 1902 11/08/16 0433  TROPONINI 0.07* 0.09*   No results for input(s): PROBNP in the last 168 hours.   CHEMISTRY  Recent Labs Lab 11/07/16 1110 11/07/16 1902 11/09/16 1426 11/09/16 1801 11/11/16 0220 11/12/16 0235 11/13/16 0300  NA 128* 134* 135 135 136 135 136  K 3.9 3.2* 4.1 4.2 3.8 3.1* 3.8  CL 91* 93*  --  100* 102 95* 98*  CO2 22 25  --  18* 21* 30 23  GLUCOSE 388* 147* 103* 139* 198* 148* 46*  BUN 70* 20  --  18 46* 19 26*  CREATININE 7.04* 3.16*  --  2.62* 4.35* 3.03* 4.44*  CALCIUM 9.0 9.2  --  8.6* 8.4* 8.6* 8.7*  MG  --   --   --   --  1.7 1.7 1.7  PHOS 3.7  --   --  3.5 3.3 2.0* 1.7*   Estimated Creatinine Clearance: 10.8 mL/min (by C-G formula based on SCr of 4.44 mg/dL (H)).   LIVER  Recent Labs Lab 11/07/16 1902  11/09/16 0421 11/09/16 1801 11/10/16 0216 11/11/16 0220 11/12/16 0235 11/13/16 0300  AST 22  --   --   --   --   --   --   --   ALT 21  --   --   --   --   --   --   --   ALKPHOS 69  --   --   --   --    --   --   --   BILITOT 0.9  --   --   --   --   --   --   --   PROT 7.5  --   --   --   --   --   --   --   ALBUMIN 3.1*  --   --  2.8*  --  2.0* 2.1* 2.1*  INR 1.80  < > 1.41  --  1.33 1.48 1.45 1.34  < > = values in this interval not displayed.   INFECTIOUS No results for input(s): LATICACIDVEN, PROCALCITON in the last 168 hours.   ENDOCRINE CBG (last 3)   Recent Labs  11/13/16 0436 11/13/16 0748 11/13/16 0824  GLUCAP 123* 56* 58*         IMAGING x48h  - image(s) personally visualized  -   highlighted in bold No results found.    MICROBIOLOGY: Urine Ctx 11/27:  Multiple species MRSA PCR 12/1:  Positive Blood Ctx x2 12/5:  2/2 MSSA Blood Ctx x1 12/7 >> Catheter Tip Culture 12/9 >>    ANTIBIOTICS: Cefepime 12/6 - 12/9 Vancomycin 12/6 - 12/9 Rifapmin 12/7 >> Cefazolin 12/9 >>  LINES/TUBES:  PIV  ASSESSMENT / PLAN:  60 y.o. female with Vocal cord paralysis status post repeat tracheostomy. Patient with acute on chronic hypertension resolved after fluid bolus. Continuing on broad-spectrum antibiotics for sepsis secondary to bacteremia/healthcare associated pneumonia per ID recommendations.  1. Vocal Cord Paralysis:  S/P repeat trach by ENT on 12/7. Tracheostomy care per ENT. Continuing ventilator wean & transition to tracheostomy collar quickly hopefully. Codeine/Guaifenesin liquid for coughing via tube q6hr scheduled. Mild bleeding around stoma 12/9 therefore holding Heparin. Bleeding resolved 11/13/16 . Restarted heparin after PEG placemnt   2. Acute on Chronic Hypotension:  Resolved after fluid bolus. Continuing Midodrine 10mg  via tube daily.  3. Paroxysmal Atrial Fibrillation:  Continuing Amiodarone. Continuous telemetry monitoring. Holding Coumadin. Holding Heparin drip till G tube placemtn . 4. HCAP/Bacteremia/Sepsis:  Awaiting finalization of repeat culture 12/7 ID following & guiding antibiotics: Rifampin & Cefazolin.  5. ESRD:  On renal  replacement therapy as per Nephrology. Tunneled HD Catheter removed 12/9. HD via LUE graft  6. Recent Inferior STEMI:  S/P DESx2.  Continuing Plavix & Lipitor . 7. Anxiety:  Off  Precedex drip. Fentanyl IV as needed for pain. Klonopin VT bid.  Signed own consent for PEG 11/13/2016   8. Leukocytosis:  Stable. Multifactorial.   9. GERD:  Protonix via tube BID.  10. Anemia:  Secondary to chronic illness. No signs of bleeding. Trending cell counts daily w/ CBC. Ferric Gluconate IV with dialysis & Aranesp IV Saturdays.   11. DM Type 2:  Glucose uncontrolled. Continuing Accu-Checks q4hr with SSI per Moderate Algorithm. Levemir 8u [place] qhs.  12. Prophylaxis:  Protonix VT bid & SCDs.  13. Diet:  Dietician consulted for tube feedings. Consulted IR for PEG tube placement & family made aware. Speech consulted.  14. Sedation:  Desired RASS 0 to -1.  -  scheduled Klonopin bid. Dc precdexgtt from [facility] 11/13/16  Family:  Multiple family members updated 12/10 by Dr. Ashok Cordia.   Dispo   move to SDU 11/13/2016.    Dr. Brand Males, M.D., [facility].C.P Pulmonary and Critical Care Medicine Staff Physician [place] [facility] Pager: [phone], If no answer or between  15:00h - 7:00h: call [phone]  11/13/2016 10:55 AM

## 2016-11-13 NOTE — Procedures (Signed)
Interventional Radiology Procedure Note  Procedure: Placement of percutaneous 67F pull-through gastrostomy tube. Complications: None Recommendations: - NPO except for sips and chips remainder of today and overnight - Maintain G-tube to LWS until tomorrow morning  - May advance diet as tolerated and begin using tube tomorrow morning - MUST RECEIVE PLAVIX DOSE TODAY  Signed,   Dulcy Fanny. Earleen Newport, DO

## 2016-11-13 NOTE — Progress Notes (Signed)
Spoke with Vaughan Basta, RN bedside nurse regarding PICC placement.  As patient is currently being followed by nephrology, we are unable to put PICC until nephrologist approves.  Vaughan Basta, RN states that she will let Dr. Chase Caller know.  Carolee Rota, RN VAST

## 2016-11-13 NOTE — Progress Notes (Signed)
Inpatient Diabetes Program Recommendations  AACE/ADA: New Consensus Statement on Inpatient Glycemic Control (2015)  Target Ranges:  Prepandial:   less than 140 mg/dL      Peak postprandial:   less than 180 mg/dL (1-2 hours)      Critically ill patients:  140 - 180 mg/dL   Lab Results  Component Value Date   GLUCAP 58 (L) 11/13/2016   HGBA1C 13.3 (H) 08/15/2016   Inpatient Diabetes Program Recommendations:    Consider reducing Novolog correction to sensitive q 4 hours.  Thanks, Adah Perl, RN, BC-ADM Inpatient Diabetes Coordinator Pager [phone] (8a-5p)

## 2016-11-13 NOTE — Progress Notes (Signed)
SLP Cancellation Note  Patient Details Name: Deborah [name] MRN: [id number] DOB: [birth year]   Cancelled treatment:       Reason Eval/Treat Not Completed: Medical issues which prohibited therapy. Pt remains on full vent support and per RN is going for PEG today. Will continue to hold on swallow evaluation; however, MD may wish to consider in-line PMV if pt will require more prolonged vent wean.   Germain Osgood 11/13/2016, 10:38 AM  Germain Osgood, M.A. CCC-SLP [phone]

## 2016-11-13 NOTE — Progress Notes (Addendum)
[place] for heparin Indication: atrial fibrillation; bioprosthetic MVR  No Known Allergies  Patient Measurements: Height: 5\' 2"  (157.5 cm) Weight: 120 lb 5.9 oz (54.6 kg) IBW/kg (Calculated) : 50.1 Heparin Dosing Weight: 55 kg  Vital Signs: Temp: 97.7 F (36.5 C) (12/11 1544) Temp Source: Oral (12/11 1544) BP: 155/49 (12/11 1500) Pulse Rate: 78 (12/11 1500)  Labs:  Recent Labs  11/11/16 0220 11/12/16 0235 11/13/16 0300  HGB 10.1* 10.6* 10.8*  HCT 32.4* 34.0* 34.0*  PLT 253 251 297  LABPROT 18.1* 17.7* 16.6*  INR 1.48 1.45 1.34  HEPARINUNFRC 0.33 <0.10*  --   CREATININE 4.35* 3.03* 4.44*    Estimated Creatinine Clearance: 10.8 mL/min (by C-G formula based on SCr of 4.44 mg/dL (H)).   Medical History: Past Medical History:  Diagnosis Date  . Diabetes mellitus   . Hyperlipidemia   . Hypertension   . MI, old   . Renal disorder    dialysls    Assessment: 81 yoF with atrial fibrillation on warfarin PTA that is currently on hold for G-tube placement.  Heparin infusion was turned off on 12/9 pm do to bleeding around trach that has since stopped.  G- tube now placed and ok per IR (Dr. Earleen Newport) and CCM (Dr. Chase Caller) to resume heparin 6 hours post procedure. CBC stable. No further bleeding noted. Procedure went well with minimal stick needed.   Goal of Therapy:  Heparin level 0.3-0.7 units/ml Monitor platelets by anticoagulation protocol: Yes   Plan:  Restart heparin at 950 units/hr at 20:30 PM tonight.  No bolus due to recent procedure. Heparin level 6 hours post restart - ok to do with AM labs.  Monitor daily heparin level and CBC while on therapy.  Follow-up restart of warfarin as able  Sloan Leiter, PharmD, BCPS Clinical Pharmacist [phone] 11/13/2016, 4:06 PM Pager: [phone]

## 2016-11-13 NOTE — Progress Notes (Signed)
[facility] Progress Note   Subjective: sp trach now, on vent, responsive  Vitals:   11/13/16 0900 11/13/16 1000 11/13/16 1100 11/13/16 1129  BP: (!) 157/46 (!) 155/53 (!) 95/51 (!) 95/51  Pulse: 73 78 77 73  Resp: 18 17 18 14   Temp:      TempSrc:      SpO2: 100% 99% 99% 100%  Weight:      Height:        Inpatient medications: . amiodarone  200 mg Per Tube Daily  . atorvastatin  20 mg Per Tube q1800  .  ceFAZolin (ANCEF) IV  1 g Intravenous Q24H  . chlorhexidine gluconate (MEDLINE KIT)  15 mL Mouth Rinse BID  . clonazePAM  0.5 mg Per Tube BID  . clopidogrel  75 mg Per Tube Daily  . darbepoetin (ARANESP) injection - DIALYSIS  60 mcg Intravenous Q Sat-HD  . docusate  100 mg Per Tube BID  . ferric gluconate (FERRLECIT/NULECIT) IV  125 mg Intravenous Q T,Th,Sa-HD  . guaiFENesin-codeine  5 mL Per Tube Q6H  . insulin aspart  0-15 Units Subcutaneous Q4H  . insulin detemir  8 Units Subcutaneous QHS  . mouth rinse  15 mL Mouth Rinse QID  . metoCLOPramide  10 mg Per Tube BID  . midodrine  10 mg Per Tube q morning - 10a  . multivitamin  1 tablet Oral QHS  . pantoprazole sodium  40 mg Per Tube Daily  . rifampin  300 mg Per Tube Q8H  . sennosides  5 mL Per Tube BID  . sodium chloride flush  3 mL Intravenous Q12H   . sodium chloride Stopped (11/13/16 1000)  . feeding supplement (VITAL AF 1.2 CAL) Stopped (11/11/16 1829)   fentaNYL (SUBLIMAZE) injection, fluticasone, levalbuterol, ondansetron (ZOFRAN) IV, prochlorperazine  Exam: Alert, on vent w trach No jvd Chest clear bilat RRR no rg ABd soft ntnd +bs   LUA AVF+bruit No LE edema  12/8 CXR no edema, LLL opacity  Dialysis: [facility] ([place])  4h 2/2.5 bath  L IJ perm cath (now removed)/ LUA AVG (using now), dont' have dry wt yet, only had one outpt HD after dc'd on 11/29      Assessment: 1. MSSA bacteremia/ cath sepsis - 12/05, 2/2 blood cx's, sp HD cath removal, using AVG; rifampin/ Ancef per  ID 2. SP repeat trach 12/7- for VC paralysis and upper airway obstruction, on vent 3. ESRD TTS HD started HD Sept 2017 4. Anemia Hb stable, no need ESA 5. 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 Vol - stable wt 54-55kg, no vol excess, no OP dry wt yet, last CXR no edema  Plan - HD Tuesday   Kelly Splinter MD [facility] pager [phone]   11/13/2016, 11:37 AM    Recent Labs Lab 11/11/16 0220 11/12/16 0235 11/13/16 0300 11/13/16 1043  NA 136 135 136  --   K 3.8 3.1* 3.8  --   CL 102 95* 98*  --   CO2 21* 30 23  --   GLUCOSE 198* 148* 46* 494*  BUN 46* 19 26*  --   CREATININE 4.35* 3.03* 4.44*  --   CALCIUM 8.4* 8.6* 8.7*  --   PHOS 3.3 2.0* 1.7*  --     Recent Labs Lab 11/07/16 1902  11/11/16 0220 11/12/16 0235 11/13/16 0300  AST 22  --   --   --   --   ALT 21  --   --   --   --  ALKPHOS 69  --   --   --   --   BILITOT 0.9  --   --   --   --   PROT 7.5  --   --   --   --   ALBUMIN 3.1*  < > 2.0* 2.1* 2.1*  < > = values in this interval not displayed.  Recent Labs Lab 11/11/16 0220 11/12/16 0235 11/13/16 0300  WBC 17.4* 18.7* 15.6*  HGB 10.1* 10.6* 10.8*  HCT 32.4* 34.0* 34.0*  MCV 88.3 87.2 86.1  PLT 253 251 297   Iron/TIBC/Ferritin/ %Sat    Component Value Date/Time   IRON 22 (L) 10/10/2016 0356   TIBC 326 10/10/2016 0356   FERRITIN 272 10/10/2016 0356   IRONPCTSAT 7 (L) 10/10/2016 8550

## 2016-11-13 NOTE — Sedation Documentation (Signed)
Patient is resting comfortably.  tOLERATED g TUBE INSERTION WELL.  reADY FOR TX BACK TO 3m

## 2016-11-13 NOTE — Procedures (Signed)
Interventional Radiology Procedure Note  Procedure: Placement of a left IJ approach double lumen cuffed catheter.  22cm length.  Tip is positioned at the superior cavoatrial junction and catheter is ready for immediate use.  Complications: None Recommendations:  - Ok to use - Do not submerge for 7 days - Routine care   Signed,  Dulcy Fanny. Earleen Newport, DO

## 2016-11-13 NOTE — Progress Notes (Signed)
INFECTIOUS DISEASE PROGRESS NOTE  ID: Deborah [name] is a 60 y.o. female with  Principal Problem:   Staphylococcus aureus bacteremia Active Problems:   Mitral regurgitation   S/P MVR (mitral valve replacement)   HCAP (healthcare-associated pneumonia)   Diabetes mellitus type 2 in nonobese ([name])   ESRD on dialysis ([place])   Anemia of chronic disease   Atrial flutter ([name])   Respiratory distress   Vocal cord paralysis  Subjective: Awake, alert, comfortable  Abtx:  Anti-infectives    Start     Dose/Rate Route Frequency Ordered Stop   11/11/16 1800  ceFAZolin (ANCEF) IVPB 1 g/50 mL premix     1 g 100 mL/hr over 30 Minutes Intravenous Every 24 hours 11/11/16 1425     11/10/16 1400  rifampin (RIFADIN) 60 mg/mL oral suspension 300 mg     300 mg Per Tube Every 8 hours 11/10/16 1029     11/10/16 0815  vancomycin (VANCOCIN) 500 mg in sodium chloride 0.9 % 100 mL IVPB     500 mg 100 mL/hr over 60 Minutes Intravenous  Once 11/10/16 0802 11/10/16 1143   11/09/16 1830  rifampin (RIFADIN) 60 mg/mL oral suspension 300 mg  Status:  Discontinued     300 mg Oral Every 8 hours 11/09/16 1711 11/10/16 1029   11/09/16 1200  vancomycin (VANCOCIN) 500 mg in sodium chloride 0.9 % 100 mL IVPB  Status:  Discontinued     500 mg 100 mL/hr over 60 Minutes Intravenous Every T-Th-Sa (Hemodialysis) 11/08/16 0919 11/11/16 1424   11/08/16 1000  ceFEPIme (MAXIPIME) 1 g in dextrose 5 % 50 mL IVPB  Status:  Discontinued     1 g 100 mL/hr over 30 Minutes Intravenous Every 24 hours 11/08/16 0919 11/11/16 1424   11/08/16 0930  vancomycin (VANCOCIN) 1,250 mg in sodium chloride 0.9 % 250 mL IVPB     1,250 mg 166.7 mL/hr over 90 Minutes Intravenous  Once 11/08/16 0919 11/08/16 1415      Medications:  Scheduled: . amiodarone  200 mg Per Tube Daily  . atorvastatin  20 mg Per Tube q1800  .  ceFAZolin (ANCEF) IV  1 g Intravenous Q24H  . chlorhexidine gluconate (MEDLINE KIT)  15 mL Mouth Rinse BID  . clonazePAM   0.5 mg Per Tube BID  . clopidogrel  75 mg Per Tube Daily  . darbepoetin (ARANESP) injection - DIALYSIS  60 mcg Intravenous Q Sat-HD  . docusate  100 mg Per Tube BID  . ferric gluconate (FERRLECIT/NULECIT) IV  125 mg Intravenous Q T,Th,Sa-HD  . guaiFENesin-codeine  5 mL Per Tube Q6H  . insulin aspart  0-15 Units Subcutaneous Q4H  . insulin detemir  8 Units Subcutaneous QHS  . mouth rinse  15 mL Mouth Rinse QID  . metoCLOPramide  10 mg Per Tube BID  . midodrine  10 mg Per Tube q morning - 10a  . multivitamin  1 tablet Oral QHS  . pantoprazole sodium  40 mg Per Tube Daily  . rifampin  300 mg Per Tube Q8H  . sennosides  5 mL Per Tube BID  . sodium chloride flush  3 mL Intravenous Q12H    Objective: Vital signs in last 24 hours: Temp:  [97.4 F (36.3 C)-98.7 F (37.1 C)] 98.1 F (36.7 C) (12/11 0750) Pulse Rate:  [72-91] 75 (12/11 0833) Resp:  [11-21] 18 (12/11 0833) BP: (98-141)/(47-69) 138/68 (12/11 0833) SpO2:  [98 %-100 %] 100 % (12/11 0833) FiO2 (%):  [40 %]  40 % (12/11 0833) Weight:  [54.6 kg (120 lb 5.9 oz)] 54.6 kg (120 lb 5.9 oz) (12/11 0400)   General appearance: alert, cooperative and no distress Resp: diminished breath sounds bilaterally Cardio: regular rate and rhythm GI: normal findings: soft, non-tender and abnormal findings:  hypoactive bowel sounds Extremities: LUE AVF + bruit  Lab Results  Recent Labs  11/12/16 0235 11/13/16 0300  WBC 18.7* 15.6*  HGB 10.6* 10.8*  HCT 34.0* 34.0*  NA 135 136  K 3.1* 3.8  CL 95* 98*  CO2 30 23  BUN 19 26*  CREATININE 3.03* 4.44*   Liver Panel  Recent Labs  11/12/16 0235 11/13/16 0300  ALBUMIN 2.1* 2.1*   Sedimentation Rate No results for input(s): ESRSEDRATE in the last 72 hours. C-Reactive Protein No results for input(s): CRP in the last 72 hours.  Microbiology: Recent Results (from the past 240 hour(s))  Culture, blood (Routine X 2) w Reflex to ID Panel     Status: Abnormal   Collection Time:  11/07/16  7:02 PM  Result Value Ref Range Status   Specimen Description BLOOD RIGHT ANTECUBITAL  Final   Special Requests IN PEDIATRIC BOTTLE 2CC  Final   Culture  Setup Time   Final    AEROBIC BOTTLE ONLY GRAM POSITIVE COCCI Organism ID to follow CRITICAL RESULT CALLED TO, READ BACK BY AND VERIFIED WITH: JAMES LEDFORD, PHARMD _0  11/08/16 MKELLY,MLT    Culture STAPHYLOCOCCUS AUREUS (A)  Final   Report Status 11/11/2016 FINAL  Final   Organism ID, Bacteria STAPHYLOCOCCUS AUREUS  Final      Susceptibility   Staphylococcus aureus - MIC*    CIPROFLOXACIN 2 INTERMEDIATE Intermediate     ERYTHROMYCIN >=8 RESISTANT Resistant     GENTAMICIN <=0.5 SENSITIVE Sensitive     OXACILLIN 0.5 SENSITIVE Sensitive     TETRACYCLINE <=1 SENSITIVE Sensitive     VANCOMYCIN <=0.5 SENSITIVE Sensitive     TRIMETH/SULFA <=10 SENSITIVE Sensitive     CLINDAMYCIN <=0.25 RESISTANT Resistant     RIFAMPIN <=0.5 SENSITIVE Sensitive     Inducible Clindamycin POSITIVE Resistant     * STAPHYLOCOCCUS AUREUS  Blood Culture ID Panel (Reflexed)     Status: Abnormal   Collection Time: 11/07/16  7:02 PM  Result Value Ref Range Status   Enterococcus species NOT DETECTED NOT DETECTED Final   Listeria monocytogenes NOT DETECTED NOT DETECTED Final   Staphylococcus species DETECTED (A) NOT DETECTED Final    Comment: CRITICAL RESULT CALLED TO, READ BACK BY AND VERIFIED WITH: JAMES LEDFORD, PHARMD _1  11/08/16 MKELLY,MLT    Staphylococcus aureus DETECTED (A) NOT DETECTED Final    Comment: CRITICAL RESULT CALLED TO, READ BACK BY AND VERIFIED WITH: JAMES LEDFORD, PHARMD _2  11/08/16 MKELLY,MLT    Methicillin resistance NOT DETECTED NOT DETECTED Final   Streptococcus species NOT DETECTED NOT DETECTED Final   Streptococcus agalactiae NOT DETECTED NOT DETECTED Final   Streptococcus pneumoniae NOT DETECTED NOT DETECTED Final   Streptococcus pyogenes NOT DETECTED NOT DETECTED Final   Acinetobacter baumannii NOT DETECTED  NOT DETECTED Final   Enterobacteriaceae species NOT DETECTED NOT DETECTED Final   Enterobacter cloacae complex NOT DETECTED NOT DETECTED Final   Escherichia coli NOT DETECTED NOT DETECTED Final   Klebsiella oxytoca NOT DETECTED NOT DETECTED Final   Klebsiella pneumoniae NOT DETECTED NOT DETECTED Final   Proteus species NOT DETECTED NOT DETECTED Final   Serratia marcescens NOT DETECTED NOT DETECTED Final   Haemophilus influenzae NOT DETECTED NOT DETECTED Final  Neisseria meningitidis NOT DETECTED NOT DETECTED Final   Pseudomonas aeruginosa NOT DETECTED NOT DETECTED Final   Candida albicans NOT DETECTED NOT DETECTED Final   Candida glabrata NOT DETECTED NOT DETECTED Final   Candida krusei NOT DETECTED NOT DETECTED Final   Candida parapsilosis NOT DETECTED NOT DETECTED Final   Candida tropicalis NOT DETECTED NOT DETECTED Final  Culture, blood (Routine X 2) w Reflex to ID Panel     Status: Abnormal   Collection Time: 11/07/16  7:08 PM  Result Value Ref Range Status   Specimen Description BLOOD RIGHT HAND  Final   Special Requests BOTTLES DRAWN AEROBIC ONLY 5CC  Final   Culture  Setup Time   Final    GRAM POSITIVE COCCI IN CLUSTERS AEROBIC BOTTLE ONLY CRITICAL VALUE NOTED.  VALUE IS CONSISTENT WITH PREVIOUSLY REPORTED AND CALLED VALUE.    Culture (A)  Final    STAPHYLOCOCCUS AUREUS SUSCEPTIBILITIES PERFORMED ON PREVIOUS CULTURE WITHIN THE LAST 5 DAYS.    Report Status 11/11/2016 FINAL  Final  Culture, blood (Routine X 2) w Reflex to ID Panel     Status: None (Preliminary result)   Collection Time: 11/09/16  6:05 PM  Result Value Ref Range Status   Specimen Description BLOOD RIGHT HAND  Final   Special Requests BOTTLES DRAWN AEROBIC ONLY 6CC  Final   Culture NO GROWTH 3 DAYS  Final   Report Status PENDING  Incomplete  Cath Tip Culture     Status: None (Preliminary result)   Collection Time: 11/11/16  2:58 PM  Result Value Ref Range Status   Specimen Description CATH TIP  Final    Special Requests NONE  Final   Culture NO GROWTH < 24 HOURS  Final   Report Status PENDING  Incomplete    Studies/Results: No results found.   Assessment/Plan: MSSA bacteremia HCAP Steroid induced leukocytosis Prev CV surgery (MV bioprosthesis) Vocal cord paralysis, trach redo 12-7  improving Continue rifampin (prev LFs normal) Consider pulling her HD line out on 12-7 Await TEE Repeat BCx 12-7 ngtd  Total days of antibiotics: 6 (ancef/rifampin)         Bobby Rumpf Infectious Diseases (pager) [phone] [email or web address] 11/13/2016, 9:16 AM  LOS: 11 days

## 2016-11-14 DIAGNOSIS — R7881 Bacteremia: Secondary | ICD-10-CM

## 2016-11-14 DIAGNOSIS — E118 Type 2 diabetes mellitus with unspecified complications: Secondary | ICD-10-CM

## 2016-11-14 DIAGNOSIS — I2109 ST elevation (STEMI) myocardial infarction involving other coronary artery of anterior wall: Secondary | ICD-10-CM

## 2016-11-14 DIAGNOSIS — E1165 Type 2 diabetes mellitus with hyperglycemia: Secondary | ICD-10-CM

## 2016-11-14 DIAGNOSIS — I219 Acute myocardial infarction, unspecified: Secondary | ICD-10-CM

## 2016-11-14 DIAGNOSIS — Z931 Gastrostomy status: Secondary | ICD-10-CM

## 2016-11-14 DIAGNOSIS — IMO0002 Reserved for concepts with insufficient information to code with codable children: Secondary | ICD-10-CM

## 2016-11-14 DIAGNOSIS — Z992 Dependence on renal dialysis: Secondary | ICD-10-CM

## 2016-11-14 DIAGNOSIS — N186 End stage renal disease: Secondary | ICD-10-CM

## 2016-11-14 DIAGNOSIS — I480 Paroxysmal atrial fibrillation: Secondary | ICD-10-CM

## 2016-11-14 LAB — POTASSIUM: POTASSIUM: 3.9 mmol/L (ref 3.5–5.1)

## 2016-11-14 LAB — CULTURE, BLOOD (ROUTINE X 2): CULTURE: NO GROWTH

## 2016-11-14 LAB — PROTIME-INR
INR: 1.87
PROTHROMBIN TIME: 21.8 s — AB (ref 11.4–15.2)

## 2016-11-14 LAB — CBC
HEMATOCRIT: 31.9 % — AB (ref 36.0–46.0)
HEMOGLOBIN: 10 g/dL — AB (ref 12.0–15.0)
MCH: 27 pg (ref 26.0–34.0)
MCHC: 31.3 g/dL (ref 30.0–36.0)
MCV: 86.2 fL (ref 78.0–100.0)
Platelets: 329 10*3/uL (ref 150–400)
RBC: 3.7 MIL/uL — AB (ref 3.87–5.11)
RDW: 18.4 % — ABNORMAL HIGH (ref 11.5–15.5)
WBC: 13.6 10*3/uL — ABNORMAL HIGH (ref 4.0–10.5)

## 2016-11-14 LAB — GLUCOSE, CAPILLARY
GLUCOSE-CAPILLARY: 80 mg/dL (ref 65–99)
Glucose-Capillary: 132 mg/dL — ABNORMAL HIGH (ref 65–99)
Glucose-Capillary: 107 mg/dL — ABNORMAL HIGH (ref 65–99)
Glucose-Capillary: 145 mg/dL — ABNORMAL HIGH (ref 65–99)
Glucose-Capillary: 117 mg/dL — ABNORMAL HIGH (ref 65–99)

## 2016-11-14 LAB — RENAL FUNCTION PANEL
ANION GAP: 14 (ref 5–15)
Albumin: 1.9 g/dL — ABNORMAL LOW (ref 3.5–5.0)
BUN: 32 mg/dL — ABNORMAL HIGH (ref 6–20)
CALCIUM: 8.5 mg/dL — AB (ref 8.9–10.3)
CO2: 25 mmol/L (ref 22–32)
CREATININE: 5.76 mg/dL — AB (ref 0.44–1.00)
Chloride: 96 mmol/L — ABNORMAL LOW (ref 101–111)
GFR, EST AFRICAN AMERICAN: 8 mL/min — AB (ref >60)
GFR, EST NON AFRICAN AMERICAN: 7 mL/min — AB (ref >60)
Glucose, Bld: 115 mg/dL — ABNORMAL HIGH (ref 65–99)
PHOSPHORUS: 2.1 mg/dL — AB (ref 2.5–4.6)
Potassium: 3.1 mmol/L — ABNORMAL LOW (ref 3.5–5.1)
SODIUM: 135 mmol/L (ref 135–145)

## 2016-11-14 LAB — HEPARIN LEVEL (UNFRACTIONATED)
HEPARIN UNFRACTIONATED: 0.36 [IU]/mL (ref 0.30–0.70)
Heparin Unfractionated: 0.57 IU/mL (ref 0.30–0.70)

## 2016-11-14 LAB — MAGNESIUM
MAGNESIUM: 1.7 mg/dL (ref 1.7–2.4)
MAGNESIUM: 2 mg/dL (ref 1.7–2.4)

## 2016-11-14 MED ORDER — POTASSIUM CHLORIDE 20 MEQ/15ML (10%) PO SOLN
30.0000 meq | Freq: Once | ORAL | Status: AC
  Administered 2016-11-14: 30 meq
  Filled 2016-11-14: qty 30

## 2016-11-14 MED ORDER — BACITRACIN-NEOMYCIN-POLYMYXIN OINTMENT TUBE
1.0000 | TOPICAL_OINTMENT | Freq: Every day | CUTANEOUS | Status: AC
Start: 2016-11-14 — End: 2016-11-20
  Administered 2016-11-14 – 2016-11-20 (×7): 1 via TOPICAL
  Filled 2016-11-14 (×2): qty 1

## 2016-11-14 MED ORDER — MAGNESIUM SULFATE 2 GM/50ML IV SOLN
2.0000 g | Freq: Once | INTRAVENOUS | Status: AC
  Administered 2016-11-14: 2 g via INTRAVENOUS
  Filled 2016-11-14: qty 50

## 2016-11-14 NOTE — Progress Notes (Signed)
PROGRESS NOTE    Deborah [name]  AJG:[id number] DOB: March 04, 1956 DOA: 11/02/2016 PCP: Rica Mast, MD   Brief Narrative:  60-yo BF PMHx Prolonged intubation requiring tracheostomy placement and subsequent decannulation, STEMI with Papillary Muscle Rupture requiring S/P surgical repair in September, Atrial Flutter S/P cardioversion on Coumadin. HTN, DM type II uncontrolled with complication, HLD, ESRD on HD (T/TH/Sat)  Patient reports her dyspnea has persisted even in rehabilitation. Reports intermittent nonproductive cough as well as intermittent wheezing. Denies any chest pain or tightness at this time. Denies any subjective fever, chills, or sweats. Reports she was using incentive spirometry but it's unclear whether or not this was effective. Patient presented with labored breathing today during hemodialysis session. She does endorse some orthopnea as well as paroxysmal nocturnal dyspnea.    Subjective: 12/12  patient appears to be A/O 4. Nods yes and no to questions appropriately. Appears comfortable. Indicates negative CP, negative SOB (on vent).     Assessment & Plan:   Principal Problem:   Staphylococcus aureus bacteremia Active Problems:   Mitral regurgitation   S/P MVR (mitral valve replacement)   HCAP (healthcare-associated pneumonia)   Diabetes mellitus type 2 in nonobese ([name])   ESRD on dialysis ([place])   Anemia of chronic disease   Atrial flutter ([name])   Respiratory distress   Vocal cord paralysis  Vocal cord paralysis S/P repeat tracheostomy by ENT on 12/7 -Tracheostomy care per ENT. - Continuing ventilator wean &transition to tracheostomy collar quickly hopefully. Codeine/Guaifenesin liquid for coughing via tube q6hr scheduled.   HCAP/Bacteremia/Sepsis:  -Awaiting finalization of repeat culture 12/7 ID following &guiding antibiotics: Rifampin & Cefazolin  PEG stoma bleeding -Mild bleeding around stoma 12/9 therefore holding Heparin.  -Bleeding  resolved 11/13/16 .   ESRD on HD (T/TH/Sat) -Per nephrology  Hypokalemia -Potassium goal> 4 -Potassium 30 mEq -Repeat K/Mg: At goal  Hypomagnesemia -Magnesium goal> 2 -Magnesium IV 2 g  Paroxysmal Atrial Fibrillation -Amiodarone  Bioprosthetic heart valve  Recent Inferior STEMI:  -S/P DESx2. Continuing Plavix &Lipitor  Hypotension -Midodrin 10 mg daily  DM type II uncontrolled with complication -3/55 Hemoglobin A1c= 13.3 -Vital AF 1.2 CAL 50 ml/hr -Moderate SSI  Anxiety     DVT prophylaxis: Heparin drip Code Status: Full Family Communication: None   Disposition Plan: SNF   Consultants:  [facility] Nephrology ID    Procedures/Significant Events:  09/12 Admit to [facility] 09/15 To [facility] 09/20 MVR with bioprosthetic valve 09/28 Permcath for HD 09/29 Extubated 09/30 Reintubated 10/05 Trach by Hyman Bible 10/09 Off vent 10/25 decannulated 11/8 to CIR 11/29 DC CIR 11/30 admit for resp distress 127 - redo trach Tracheostomy 6.4 cuffed [name]) 12/7 >> 12/09 L IJ Tunneled HD catheter removed . LUE graft being used 12/10 -  Patient did have some mild emesis overnight. Denies any abdominal pain or nausea at this time. Denies any chest pain or difficulty breathing at this time.   VENTILATOR SETTINGS:    Cultures Urine Ctx 11/27: Multiple species MRSA PCR 12/1: Positive Blood Ctx x2 12/5:  2/2 MSSA Blood Ctx x1 12/7 >> Catheter Tip Culture 12/9 >>  Antimicrobials: Cefepime 12/6 - 12/9 Vancomycin 12/6 - 12/9 Rifapmin 12/7 >> Cefazolin 12/9 >>   Devices    LINES / TUBES:      Continuous Infusions: . sodium chloride 10 mL/hr at 11/13/16 2000  . feeding supplement (VITAL AF 1.2 CAL) Stopped (11/11/16 1829)  . heparin 950 Units/hr (11/14/16 0600)     Objective: Vitals:   11/14/16 0337 11/14/16  0400 11/14/16 0500 11/14/16 0600  BP:  (!) 134/55 (!) 102/48 (!) 118/56  Pulse:  79 83 84  Resp:  _0 Temp: 98.4 F (36.9 C)     TempSrc: Oral       SpO2: 100% 100% 100% 100%  Weight:   56.8 kg (125 lb 3.5 oz)   Height:        Intake/Output Summary (Last 24 hours) at 11/14/16 0750 Last data filed at 11/14/16 0700  Gross per 24 hour  Intake           480.54 ml  Output                0 ml  Net           480.54 ml   Filed Weights   11/12/16 0400 11/13/16 0400 11/14/16 0500  Weight: 54.9 kg (121 lb 0.5 oz) 54.6 kg (120 lb 5.9 oz) 56.8 kg (125 lb 3.5 oz)    Examination:  General:A/O 4. Nods yes and no to questions appropriately, positive acute on chronic respiratory distress (vent dependent) Eyes: negative scleral hemorrhage, negative anisocoria, negative icterus ENT: Negative Runny nose, negative gingival bleeding, Neck:  Negative scars, masses, torticollis, lymphadenopathy, JVD, tracheostomy present negative sign of infection. Lungs: Clear to auscultation bilaterally without wheezes or crackles Cardiovascular: Regular rate and rhythm without murmur gallop or rub normal S1 and S2 Abdomen: negative abdominal pain, nondistended, positive soft, bowel sounds, no rebound, no ascites, no appreciable mass Extremities: No significant cyanosis, clubbing, or edema bilateral lower extremities Skin: Negative rashes, lesions, ulcers Psychiatric:  Negative depression, negative anxiety, negative fatigue, negative mania  Central nervous system:  Cranial nerves II through XII intact, tongue/uvula midline, all extremities muscle strength 5/5, sensation intact throughout, negative receptive aphasia.  .     Data Reviewed: Care during the described time interval was provided by me .  I have reviewed this patient's available data, including medical history, events of note, physical examination, and all test results as part of my evaluation. I have personally reviewed and interpreted all radiology studies.  CBC:  Recent Labs Lab 11/10/16 0216 11/11/16 0220 11/12/16 0235 11/13/16 0300 11/14/16 0523  WBC 16.9* 17.4* 18.7* 15.6* 13.6*  HGB  11.5* 10.1* 10.6* 10.8* 10.0*  HCT 36.1 32.4* 34.0* 34.0* 31.9*  MCV 88.3 88.3 87.2 86.1 86.2  PLT 298 253 251 297 220   Basic Metabolic Panel:  Recent Labs Lab 11/09/16 1801 11/11/16 0220 11/12/16 0235 11/13/16 0300 11/13/16 1043 11/14/16 0523  NA 135 136 135 136  --  135  K 4.2 3.8 3.1* 3.8  --  3.1*  CL 100* 102 95* 98*  --  96*  CO2 18* 21* 30 23  --  25  GLUCOSE 139* 198* 148* 46* 494* 115*  BUN 18 46* 19 26*  --  32*  CREATININE 2.62* 4.35* 3.03* 4.44*  --  5.76*  CALCIUM 8.6* 8.4* 8.6* 8.7*  --  8.5*  MG  --  1.7 1.7 1.7  --  1.7  PHOS 3.5 3.3 2.0* 1.7*  --  2.1*   GFR: Estimated Creatinine Clearance: 8.3 mL/min (by C-G formula based on SCr of 5.76 mg/dL (H)). Liver Function Tests:  Recent Labs Lab 11/07/16 1902 11/09/16 1801 11/11/16 0220 11/12/16 0235 11/13/16 0300 11/14/16 0523  AST 22  --   --   --   --   --   ALT 21  --   --   --   --   --  ALKPHOS 69  --   --   --   --   --   BILITOT 0.9  --   --   --   --   --   PROT 7.5  --   --   --   --   --   ALBUMIN 3.1* 2.8* 2.0* 2.1* 2.1* 1.9*   No results for input(s): LIPASE, AMYLASE in the last 168 hours. No results for input(s): AMMONIA in the last 168 hours. Coagulation Profile:  Recent Labs Lab 11/10/16 0216 11/11/16 0220 11/12/16 0235 11/13/16 0300 11/14/16 0523  INR 1.33 1.48 1.45 1.34 1.87   Cardiac Enzymes:  Recent Labs Lab 11/07/16 1902 11/08/16 0433  TROPONINI 0.07* 0.09*   BNP (last 3 results) No results for input(s): PROBNP in the last 8760 hours. HbA1C: No results for input(s): HGBA1C in the last 72 hours. CBG:  Recent Labs Lab 11/13/16 1538 11/13/16 1820 11/13/16 1956 11/13/16 2359 11/14/16 0401  GLUCAP 226* 197* 190* 214* 132*   Lipid Profile: No results for input(s): CHOL, HDL, LDLCALC, TRIG, CHOLHDL, LDLDIRECT in the last 72 hours. Thyroid Function Tests: No results for input(s): TSH, T4TOTAL, FREET4, T3FREE, THYROIDAB in the last 72 hours. Anemia Panel: No  results for input(s): VITAMINB12, FOLATE, FERRITIN, TIBC, IRON, RETICCTPCT in the last 72 hours. Urine analysis:    Component Value Date/Time   COLORURINE YELLOW 10/30/2016 2120   APPEARANCEUR CLOUDY (A) 10/30/2016 2120   LABSPEC 1.021 10/30/2016 2120   PHURINE 5.0 10/30/2016 2120   GLUCOSEU NEGATIVE 10/30/2016 2120   HGBUR NEGATIVE 10/30/2016 2120   BILIRUBINUR SMALL (A) 10/30/2016 2120   KETONESUR NEGATIVE 10/30/2016 2120   PROTEINUR 30 (A) 10/30/2016 2120   NITRITE NEGATIVE 10/30/2016 2120   LEUKOCYTESUR SMALL (A) 10/30/2016 2120   Sepsis Labs: _0 (procalcitonin:4,lacticidven:4)  ) Recent Results (from the past 240 hour(s))  Culture, blood (Routine X 2) w Reflex to ID Panel     Status: Abnormal   Collection Time: 11/07/16  7:02 PM  Result Value Ref Range Status   Specimen Description BLOOD RIGHT ANTECUBITAL  Final   Special Requests IN PEDIATRIC BOTTLE 2CC  Final   Culture  Setup Time   Final    AEROBIC BOTTLE ONLY GRAM POSITIVE COCCI Organism ID to follow CRITICAL RESULT CALLED TO, READ BACK BY AND VERIFIED WITH: JAMES LEDFORD, PHARMD _1  11/08/16 MKELLY,MLT    Culture STAPHYLOCOCCUS AUREUS (A)  Final   Report Status 11/11/2016 FINAL  Final   Organism ID, Bacteria STAPHYLOCOCCUS AUREUS  Final      Susceptibility   Staphylococcus aureus - MIC*    CIPROFLOXACIN 2 INTERMEDIATE Intermediate     ERYTHROMYCIN >=8 RESISTANT Resistant     GENTAMICIN <=0.5 SENSITIVE Sensitive     OXACILLIN 0.5 SENSITIVE Sensitive     TETRACYCLINE <=1 SENSITIVE Sensitive     VANCOMYCIN <=0.5 SENSITIVE Sensitive     TRIMETH/SULFA <=10 SENSITIVE Sensitive     CLINDAMYCIN <=0.25 RESISTANT Resistant     RIFAMPIN <=0.5 SENSITIVE Sensitive     Inducible Clindamycin POSITIVE Resistant     * STAPHYLOCOCCUS AUREUS  Blood Culture ID Panel (Reflexed)     Status: Abnormal   Collection Time: 11/07/16  7:02 PM  Result Value Ref Range Status   Enterococcus species NOT DETECTED NOT DETECTED  Final   Listeria monocytogenes NOT DETECTED NOT DETECTED Final   Staphylococcus species DETECTED (A) NOT DETECTED Final    Comment: CRITICAL RESULT CALLED TO, READ BACK BY AND VERIFIED WITH: JAMES LEDFORD,  PHARMD _0  11/08/16 MKELLY,MLT    Staphylococcus aureus DETECTED (A) NOT DETECTED Final    Comment: CRITICAL RESULT CALLED TO, READ BACK BY AND VERIFIED WITH: JAMES LEDFORD, PHARMD _1  11/08/16 MKELLY,MLT    Methicillin resistance NOT DETECTED NOT DETECTED Final   Streptococcus species NOT DETECTED NOT DETECTED Final   Streptococcus agalactiae NOT DETECTED NOT DETECTED Final   Streptococcus pneumoniae NOT DETECTED NOT DETECTED Final   Streptococcus pyogenes NOT DETECTED NOT DETECTED Final   Acinetobacter baumannii NOT DETECTED NOT DETECTED Final   Enterobacteriaceae species NOT DETECTED NOT DETECTED Final   Enterobacter cloacae complex NOT DETECTED NOT DETECTED Final   Escherichia coli NOT DETECTED NOT DETECTED Final   Klebsiella oxytoca NOT DETECTED NOT DETECTED Final   Klebsiella pneumoniae NOT DETECTED NOT DETECTED Final   Proteus species NOT DETECTED NOT DETECTED Final   Serratia marcescens NOT DETECTED NOT DETECTED Final   Haemophilus influenzae NOT DETECTED NOT DETECTED Final   Neisseria meningitidis NOT DETECTED NOT DETECTED Final   Pseudomonas aeruginosa NOT DETECTED NOT DETECTED Final   Candida albicans NOT DETECTED NOT DETECTED Final   Candida glabrata NOT DETECTED NOT DETECTED Final   Candida krusei NOT DETECTED NOT DETECTED Final   Candida parapsilosis NOT DETECTED NOT DETECTED Final   Candida tropicalis NOT DETECTED NOT DETECTED Final  Culture, blood (Routine X 2) w Reflex to ID Panel     Status: Abnormal   Collection Time: 11/07/16  7:08 PM  Result Value Ref Range Status   Specimen Description BLOOD RIGHT HAND  Final   Special Requests BOTTLES DRAWN AEROBIC ONLY 5CC  Final   Culture  Setup Time   Final    GRAM POSITIVE COCCI IN CLUSTERS AEROBIC BOTTLE  ONLY CRITICAL VALUE NOTED.  VALUE IS CONSISTENT WITH PREVIOUSLY REPORTED AND CALLED VALUE.    Culture (A)  Final    STAPHYLOCOCCUS AUREUS SUSCEPTIBILITIES PERFORMED ON PREVIOUS CULTURE WITHIN THE LAST 5 DAYS.    Report Status 11/11/2016 FINAL  Final  Culture, blood (Routine X 2) w Reflex to ID Panel     Status: None (Preliminary result)   Collection Time: 11/09/16  6:05 PM  Result Value Ref Range Status   Specimen Description BLOOD RIGHT HAND  Final   Special Requests BOTTLES DRAWN AEROBIC ONLY 6CC  Final   Culture NO GROWTH 4 DAYS  Final   Report Status PENDING  Incomplete  Cath Tip Culture     Status: None (Preliminary result)   Collection Time: 11/11/16  2:58 PM  Result Value Ref Range Status   Specimen Description CATH TIP  Final   Special Requests NONE  Final   Culture NO GROWTH 2 DAYS  Final   Report Status PENDING  Incomplete         Radiology Studies: Ir Gastrostomy Tube Mod Sed  Result Date: 11/13/2016 INDICATION: 60 year old female with dysphagia. EXAM: PERC PLACEMENT GASTROSTOMY MEDICATIONS: 2.0 g Ancef; Antibiotics were administered within 1 hour of the procedure. ANESTHESIA/SEDATION: Versed 2.0 mg IV; Fentanyl 100 mcg IV Moderate Sedation Time:  10 The patient was continuously monitored during the procedure by the interventional radiology nurse under my direct supervision. CONTRAST:  10 cc - administered into the gastric lumen. FLUOROSCOPY TIME:  Fluoroscopy Time: 0 minutes 42 seconds (1 mGy). COMPLICATIONS: None PROCEDURE: Informed written consent was obtained from the patient and the patient's family after a thorough discussion of the procedural risks, benefits and alternatives. All questions were addressed. Maximal Sterile Barrier Technique was utilized including caps, mask, sterile  gowns, sterile gloves, sterile drape, hand hygiene and skin antiseptic. A timeout was performed prior to the initiation of the procedure. The procedure, risks, benefits, and alternatives  were explained to the patient. Questions regarding the procedure were encouraged and answered. The patient understands and consents to the procedure. The epigastrium was prepped with Betadine in a sterile fashion, and a sterile drape was applied covering the operative field. A sterile gown and sterile gloves were used for the procedure. A 5-French orogastric tube is placed under fluoroscopic guidance. Scout imaging of the abdomen confirms barium within the transverse colon. The stomach was distended with gas. Under fluoroscopic guidance, an 18 gauge needle was utilized to puncture the anterior wall of the body of the stomach. An Amplatz wire was advanced through the needle passing a T fastener into the lumen of the stomach. The T fastener was secured for gastropexy. A 9-French sheath was inserted. A snare was advanced through the 9-French sheath. A Britta Mccreedy was advanced through the orogastric tube. It was snared then pulled out the oral cavity, pulling the snare, as well. The leading edge of the gastrostomy was attached to the snare. It was then pulled down the esophagus and out the percutaneous site. It was secured in place. Contrast was injected. No complication IMPRESSION: Status post fluoroscopic placed percutaneous gastrostomy tube, with 20 [place] pull-through. Signed, Dulcy Fanny. Earleen Newport, DO [facility] Radiology Electronically Signed   By: Corrie Mckusick D.O.   On: 11/13/2016 15:02   Ir Fluoro Guide Cv Line Left  Result Date: 11/13/2016 INDICATION: 60 year old female with bacteremia.  Renal failure. She has been referred for tunneled central catheter placement. EXAM: IMAGE GUIDED TUNNELED CENTRAL VENOUS LINE MEDICATIONS: 2.0 g Ancef; The antibiotic was administered within an appropriate time interval prior to skin puncture. ANESTHESIA/SEDATION: None FLUOROSCOPY TIME:  Fluoroscopy Time: 0 minutes 18 seconds COMPLICATIONS: None PROCEDURE: After written informed  consent was obtained, patient was placed in the supine position on angiographic table. Patency of the left internal jugular vein was confirmed with ultrasound with image documentation. Patient was prepped and draped in the usual sterile fashion including the right neck and right superior chest. Using ultrasound guidance, the skin and subcutaneous tissues overlying the right internal jugular vein were generously infiltrated with 1% lidocaine without epinephrine. Using ultrasound guidance, the right internal jugular vein was punctured with a micropuncture needle, and an 018 wire was advanced into the right heart confirming venous access. A small stab incision was made with an 11 blade scalpel. Peel-away sheath was placed over the wire, and then the wire was removed, marking the wire for estimation of internal catheter length. The chest wall was then generously infiltrated with 1% lidocaine for local anesthesia along the tissue tract. Small stab incision was made with 11 blade scalpel, and then the catheter was back tunneled to the puncture site at the right internal jugular vein. Catheter was pulled through the tract, with the catheter amputated at 22 cm. Catheter was advanced through the peel-away sheath, and the peel-away sheath was removed. Final image was stored. The catheter was anchored to the chest wall with 2 retention sutures, and Derma bond was used to seal the right internal jugular vein incision site and at the right chest wall. Patient tolerated the procedure well and remained hemodynamically stable throughout. No complications were encountered and no significant blood loss was encountered. IMPRESSION: Status post left IJ central venous catheter with a cuffed double lumen 22 cm catheter. Catheter ready for  use. Signed, Dulcy Fanny. Earleen Newport, DO [facility] Radiology Electronically Signed   By: Corrie Mckusick D.O.   On: 11/13/2016 15:01   Ir US Guide Vasc Access  Left  Result Date: 11/13/2016 INDICATION: 60 year old female with bacteremia.  Renal failure. She has been referred for tunneled central catheter placement. EXAM: IMAGE GUIDED TUNNELED CENTRAL VENOUS LINE MEDICATIONS: 2.0 g Ancef; The antibiotic was administered within an appropriate time interval prior to skin puncture. ANESTHESIA/SEDATION: None FLUOROSCOPY TIME:  Fluoroscopy Time: 0 minutes 18 seconds COMPLICATIONS: None PROCEDURE: After written informed consent was obtained, patient was placed in the supine position on angiographic table. Patency of the left internal jugular vein was confirmed with ultrasound with image documentation. Patient was prepped and draped in the usual sterile fashion including the right neck and right superior chest. Using ultrasound guidance, the skin and subcutaneous tissues overlying the right internal jugular vein were generously infiltrated with 1% lidocaine without epinephrine. Using ultrasound guidance, the right internal jugular vein was punctured with a micropuncture needle, and an 018 wire was advanced into the right heart confirming venous access. A small stab incision was made with an 11 blade scalpel. Peel-away sheath was placed over the wire, and then the wire was removed, marking the wire for estimation of internal catheter length. The chest wall was then generously infiltrated with 1% lidocaine for local anesthesia along the tissue tract. Small stab incision was made with 11 blade scalpel, and then the catheter was back tunneled to the puncture site at the right internal jugular vein. Catheter was pulled through the tract, with the catheter amputated at 22 cm. Catheter was advanced through the peel-away sheath, and the peel-away sheath was removed. Final image was stored. The catheter was anchored to the chest wall with 2 retention sutures, and Derma bond was used to seal the right internal jugular vein incision site and at the right chest wall. Patient tolerated the  procedure well and remained hemodynamically stable throughout. No complications were encountered and no significant blood loss was encountered. IMPRESSION: Status post left IJ central venous catheter with a cuffed double lumen 22 cm catheter. Catheter ready for use. Signed, Dulcy Fanny. Earleen Newport, DO [facility] Radiology Electronically Signed   By: Corrie Mckusick D.O.   On: 11/13/2016 15:01        Scheduled Meds: . amiodarone  200 mg Per Tube Daily  . atorvastatin  20 mg Per Tube q1800  .  ceFAZolin (ANCEF) IV  1 g Intravenous Q24H  . chlorhexidine gluconate (MEDLINE KIT)  15 mL Mouth Rinse BID  . clonazePAM  0.5 mg Per Tube BID  . clopidogrel  75 mg Per Tube Daily  . darbepoetin (ARANESP) injection - DIALYSIS  60 mcg Intravenous Q Sat-HD  . docusate  100 mg Per Tube BID  . ferric gluconate (FERRLECIT/NULECIT) IV  125 mg Intravenous Q T,Th,Sa-HD  . guaiFENesin-codeine  5 mL Per Tube Q6H  . insulin aspart  0-15 Units Subcutaneous Q4H  . mouth rinse  15 mL Mouth Rinse QID  . metoCLOPramide  10 mg Per Tube BID  . midodrine  10 mg Per Tube q morning - 10a  . multivitamin  1 tablet Oral QHS  . pantoprazole sodium  40 mg Per Tube Daily  . rifampin  300 mg Per Tube Q8H  . sennosides  5 mL Per Tube BID  . sodium chloride flush  3 mL Intravenous Q12H   Continuous Infusions: . sodium  chloride 10 mL/hr at 11/13/16 2000  . feeding supplement (VITAL AF 1.2 CAL) Stopped (11/11/16 1829)  . heparin 950 Units/hr (11/14/16 0600)     LOS: 12 days    Time spent: 40 minutes    Markeria Goetsch, Geraldo Docker, MD [facility] Pager [phone]   If 7PM-7AM, please contact night-coverage [email or web address] Password [email or web address] 11/14/2016, 7:50 AM

## 2016-11-14 NOTE — Care Management Note (Addendum)
Case Management Note  Patient Details  Name: Deborah [name] MRN: [id number] Date of Birth: 1956/10/17  Subjective/Objective:  12/1- presents with recent prolonged hospitalization secondary to papillary muscle rupture and subsequent HCAP. She was intubated requiring trach. Decannulated in the end of October. She then went to [facility] and was discharged 11/29. No presenting to [facility] ED 11/30 with compalints of SOB x1-2 weeks    12/ 6 1645 NCM spoke with son [name] [phone] he was in the room, he states when patient came home from CIR in a w/chair she still could not walk and she will most likely need to go to SNF to get rehab after dc.  Patient has a rolling walker at home and a w/chair and a bsc.  She is for tracheostomy 12/7. Will need pt eval after tracheostomy.  NCM will cont to follow for dc needs.                        Action/Plan:   Expected Discharge Date:                  Expected Discharge Plan:  Skilled Nursing Facility  In-House Referral:  Clinical Social Work  Discharge planning Services  CM Consult  Post Acute Care Choice:    Choice offered to:     DME Arranged:    DME Agency:     HH Arranged:    Ali Molina Agency:     Status of Service:  In process, will continue to follow  If discussed at Long Length of Stay Meetings, dates discussed:    Additional Comments: 11/14/2016  Discussed in LOS 11/14/16- deemed appropriate for continued stay and palliative consult to determine goals of care.  CM ordered palliative consult per communicated order of attending. CM contacted by [place] center Director and informed that pt had received only one treatment at center before being admitted in [facility].  Director relayed that if pt still requires trach at discharge it will need to be cuffless and capped.  CSW actively following for discharge as SNF is recommended.    11/10/16 Pt transferred to ICU 11/09/16.  Pt is being considered for PEG tube.  Pt remains on the ventilator via fresh  trach.  Pt is not an LTACH candidate due to lack of benefit.  PT will assess pt when she is medically stable - CSW made aware of family request above.  CM will continue to follow for discharge needs Maryclare Labrador, RN 11/14/2016, 9:12 AM

## 2016-11-14 NOTE — Progress Notes (Signed)
SLP Note  Patient Details Name: Deborah [name] MRN: [id number] DOB: [birth year]       Pt on full vent support; s/p PEG.  Vocal fold dysfunction, poor adduction per ENT findings.  Orders received for PMV assessment per Dr. Chase Caller, inline vs standard depending on length of time for weaning.  When pt is ready for swallow evaluation will need FEES.  Will follow for readiness.  Deborah [name] 11/14/2016, 9:36 AM

## 2016-11-14 NOTE — Progress Notes (Signed)
INFECTIOUS DISEASE PROGRESS NOTE  ID: Deborah [name] is a 60 y.o. female with  Principal Problem:   Staphylococcus aureus bacteremia Active Problems:   Mitral regurgitation   S/P MVR (mitral valve replacement)   HCAP (healthcare-associated pneumonia)   Diabetes mellitus type 2 in nonobese ([name])   ESRD on dialysis ([place])   Anemia of chronic disease   Atrial flutter ([name])   Respiratory distress   Vocal cord paralysis  Subjective: Awake and alert.  Interactive.  C/o abd discomfort/ nausea.   Abtx:  Anti-infectives    Start     Dose/Rate Route Frequency Ordered Stop   11/13/16 1410  ceFAZolin (ANCEF) 2-4 GM/100ML-% IVPB    Comments:  Shaaron Adler   : cabinet override      11/13/16 1410 11/13/16 1432   11/11/16 1800  ceFAZolin (ANCEF) IVPB 1 g/50 mL premix     1 g 100 mL/hr over 30 Minutes Intravenous Every 24 hours 11/11/16 1425     11/10/16 1400  rifampin (RIFADIN) 60 mg/mL oral suspension 300 mg     300 mg Per Tube Every 8 hours 11/10/16 1029     11/10/16 0815  vancomycin (VANCOCIN) 500 mg in sodium chloride 0.9 % 100 mL IVPB     500 mg 100 mL/hr over 60 Minutes Intravenous  Once 11/10/16 0802 11/10/16 1143   11/09/16 1830  rifampin (RIFADIN) 60 mg/mL oral suspension 300 mg  Status:  Discontinued     300 mg Oral Every 8 hours 11/09/16 1711 11/10/16 1029   11/09/16 1200  vancomycin (VANCOCIN) 500 mg in sodium chloride 0.9 % 100 mL IVPB  Status:  Discontinued     500 mg 100 mL/hr over 60 Minutes Intravenous Every T-Th-Sa (Hemodialysis) 11/08/16 0919 11/11/16 1424   11/08/16 1000  ceFEPIme (MAXIPIME) 1 g in dextrose 5 % 50 mL IVPB  Status:  Discontinued     1 g 100 mL/hr over 30 Minutes Intravenous Every 24 hours 11/08/16 0919 11/11/16 1424   11/08/16 0930  vancomycin (VANCOCIN) 1,250 mg in sodium chloride 0.9 % 250 mL IVPB     1,250 mg 166.7 mL/hr over 90 Minutes Intravenous  Once 11/08/16 0919 11/08/16 1415      Medications:  Scheduled: . amiodarone  200 mg Per  Tube Daily  . atorvastatin  20 mg Per Tube q1800  .  ceFAZolin (ANCEF) IV  1 g Intravenous Q24H  . chlorhexidine gluconate (MEDLINE KIT)  15 mL Mouth Rinse BID  . clonazePAM  0.5 mg Per Tube BID  . clopidogrel  75 mg Per Tube Daily  . darbepoetin (ARANESP) injection - DIALYSIS  60 mcg Intravenous Q Sat-HD  . docusate  100 mg Per Tube BID  . ferric gluconate (FERRLECIT/NULECIT) IV  125 mg Intravenous Q T,Th,Sa-HD  . guaiFENesin-codeine  5 mL Per Tube Q6H  . insulin aspart  0-15 Units Subcutaneous Q4H  . magnesium sulfate 1 - 4 g bolus IVPB  2 g Intravenous Once  . mouth rinse  15 mL Mouth Rinse QID  . metoCLOPramide  10 mg Per Tube BID  . midodrine  10 mg Per Tube q morning - 10a  . multivitamin  1 tablet Oral QHS  . pantoprazole sodium  40 mg Per Tube Daily  . rifampin  300 mg Per Tube Q8H  . sennosides  5 mL Per Tube BID  . sodium chloride flush  3 mL Intravenous Q12H    Objective: Vital signs in last 24 hours: Temp:  [  97.7 F (36.5 C)-98.4 F (36.9 C)] 97.8 F (36.6 C) (12/12 0757) Pulse Rate:  [73-102] 83 (12/12 0900) Resp:  [14-34] 18 (12/12 0900) BP: (95-174)/(39-116) 108/48 (12/12 0900) SpO2:  [99 %-100 %] 100 % (12/12 0900) FiO2 (%):  [40 %-100 %] 40 % (12/12 0827) Weight:  [56.8 kg (125 lb 3.5 oz)] 56.8 kg (125 lb 3.5 oz) (12/12 0500)   General appearance: alert, cooperative and no distress Resp: clear to auscultation bilaterally Cardio: regular rate and rhythm GI: normal findings: bowel sounds normal and soft, non-tender  Lab Results  Recent Labs  11/13/16 0300 11/14/16 0523  WBC 15.6* 13.6*  HGB 10.8* 10.0*  HCT 34.0* 31.9*  NA 136 135  K 3.8 3.1*  CL 98* 96*  CO2 23 25  BUN 26* 32*  CREATININE 4.44* 5.76*   Liver Panel  Recent Labs  11/13/16 0300 11/14/16 0523  ALBUMIN 2.1* 1.9*   Sedimentation Rate No results for input(s): ESRSEDRATE in the last 72 hours. C-Reactive Protein No results for input(s): CRP in the last 72  hours.  Microbiology: Recent Results (from the past 240 hour(s))  Culture, blood (Routine X 2) w Reflex to ID Panel     Status: Abnormal   Collection Time: 11/07/16  7:02 PM  Result Value Ref Range Status   Specimen Description BLOOD RIGHT ANTECUBITAL  Final   Special Requests IN PEDIATRIC BOTTLE 2CC  Final   Culture  Setup Time   Final    AEROBIC BOTTLE ONLY GRAM POSITIVE COCCI Organism ID to follow CRITICAL RESULT CALLED TO, READ BACK BY AND VERIFIED WITH: JAMES LEDFORD, PHARMD @2343  11/08/16 MKELLY,MLT    Culture STAPHYLOCOCCUS AUREUS (A)  Final   Report Status 11/11/2016 FINAL  Final   Organism ID, Bacteria STAPHYLOCOCCUS AUREUS  Final      Susceptibility   Staphylococcus aureus - MIC*    CIPROFLOXACIN 2 INTERMEDIATE Intermediate     ERYTHROMYCIN >=8 RESISTANT Resistant     GENTAMICIN <=0.5 SENSITIVE Sensitive     OXACILLIN 0.5 SENSITIVE Sensitive     TETRACYCLINE <=1 SENSITIVE Sensitive     VANCOMYCIN <=0.5 SENSITIVE Sensitive     TRIMETH/SULFA <=10 SENSITIVE Sensitive     CLINDAMYCIN <=0.25 RESISTANT Resistant     RIFAMPIN <=0.5 SENSITIVE Sensitive     Inducible Clindamycin POSITIVE Resistant     * STAPHYLOCOCCUS AUREUS  Blood Culture ID Panel (Reflexed)     Status: Abnormal   Collection Time: 11/07/16  7:02 PM  Result Value Ref Range Status   Enterococcus species NOT DETECTED NOT DETECTED Final   Listeria monocytogenes NOT DETECTED NOT DETECTED Final   Staphylococcus species DETECTED (A) NOT DETECTED Final    Comment: CRITICAL RESULT CALLED TO, READ BACK BY AND VERIFIED WITH: JAMES LEDFORD, PHARMD @2343  11/08/16 MKELLY,MLT    Staphylococcus aureus DETECTED (A) NOT DETECTED Final    Comment: CRITICAL RESULT CALLED TO, READ BACK BY AND VERIFIED WITH: JAMES LEDFORD, PHARMD @2343  11/08/16 MKELLY,MLT    Methicillin resistance NOT DETECTED NOT DETECTED Final   Streptococcus species NOT DETECTED NOT DETECTED Final   Streptococcus agalactiae NOT DETECTED NOT DETECTED  Final   Streptococcus pneumoniae NOT DETECTED NOT DETECTED Final   Streptococcus pyogenes NOT DETECTED NOT DETECTED Final   Acinetobacter baumannii NOT DETECTED NOT DETECTED Final   Enterobacteriaceae species NOT DETECTED NOT DETECTED Final   Enterobacter cloacae complex NOT DETECTED NOT DETECTED Final   Escherichia coli NOT DETECTED NOT DETECTED Final   Klebsiella oxytoca NOT DETECTED NOT DETECTED  Final   Klebsiella pneumoniae NOT DETECTED NOT DETECTED Final   Proteus species NOT DETECTED NOT DETECTED Final   Serratia marcescens NOT DETECTED NOT DETECTED Final   Haemophilus influenzae NOT DETECTED NOT DETECTED Final   Neisseria meningitidis NOT DETECTED NOT DETECTED Final   Pseudomonas aeruginosa NOT DETECTED NOT DETECTED Final   Candida albicans NOT DETECTED NOT DETECTED Final   Candida glabrata NOT DETECTED NOT DETECTED Final   Candida krusei NOT DETECTED NOT DETECTED Final   Candida parapsilosis NOT DETECTED NOT DETECTED Final   Candida tropicalis NOT DETECTED NOT DETECTED Final  Culture, blood (Routine X 2) w Reflex to ID Panel     Status: Abnormal   Collection Time: 11/07/16  7:08 PM  Result Value Ref Range Status   Specimen Description BLOOD RIGHT HAND  Final   Special Requests BOTTLES DRAWN AEROBIC ONLY 5CC  Final   Culture  Setup Time   Final    GRAM POSITIVE COCCI IN CLUSTERS AEROBIC BOTTLE ONLY CRITICAL VALUE NOTED.  VALUE IS CONSISTENT WITH PREVIOUSLY REPORTED AND CALLED VALUE.    Culture (A)  Final    STAPHYLOCOCCUS AUREUS SUSCEPTIBILITIES PERFORMED ON PREVIOUS CULTURE WITHIN THE LAST 5 DAYS.    Report Status 11/11/2016 FINAL  Final  Culture, blood (Routine X 2) w Reflex to ID Panel     Status: None (Preliminary result)   Collection Time: 11/09/16  6:05 PM  Result Value Ref Range Status   Specimen Description BLOOD RIGHT HAND  Final   Special Requests BOTTLES DRAWN AEROBIC ONLY 6CC  Final   Culture NO GROWTH 4 DAYS  Final   Report Status PENDING  Incomplete  Cath  Tip Culture     Status: None (Preliminary result)   Collection Time: 11/11/16  2:58 PM  Result Value Ref Range Status   Specimen Description CATH TIP  Final   Special Requests NONE  Final   Culture NO GROWTH 3 DAYS  Final   Report Status PENDING  Incomplete    Studies/Results: Ir Gastrostomy Tube Mod Sed  Result Date: 11/13/2016 INDICATION: 60 year old female with dysphagia. EXAM: PERC PLACEMENT GASTROSTOMY MEDICATIONS: 2.0 g Ancef; Antibiotics were administered within 1 hour of the procedure. ANESTHESIA/SEDATION: Versed 2.0 mg IV; Fentanyl 100 mcg IV Moderate Sedation Time:  10 The patient was continuously monitored during the procedure by the interventional radiology nurse under my direct supervision. CONTRAST:  10 cc - administered into the gastric lumen. FLUOROSCOPY TIME:  Fluoroscopy Time: 0 minutes 42 seconds (1 mGy). COMPLICATIONS: None PROCEDURE: Informed written consent was obtained from the patient and the patient's family after a thorough discussion of the procedural risks, benefits and alternatives. All questions were addressed. Maximal Sterile Barrier Technique was utilized including caps, mask, sterile gowns, sterile gloves, sterile drape, hand hygiene and skin antiseptic. A timeout was performed prior to the initiation of the procedure. The procedure, risks, benefits, and alternatives were explained to the patient. Questions regarding the procedure were encouraged and answered. The patient understands and consents to the procedure. The epigastrium was prepped with Betadine in a sterile fashion, and a sterile drape was applied covering the operative field. A sterile gown and sterile gloves were used for the procedure. A 5-French orogastric tube is placed under fluoroscopic guidance. Scout imaging of the abdomen confirms barium within the transverse colon. The stomach was distended with gas. Under fluoroscopic guidance, an 18 gauge needle was utilized to puncture the anterior wall of the  body of the stomach. An Amplatz wire was advanced  through the needle passing a T fastener into the lumen of the stomach. The T fastener was secured for gastropexy. A 9-French sheath was inserted. A snare was advanced through the 9-French sheath. A Britta Mccreedy was advanced through the orogastric tube. It was snared then pulled out the oral cavity, pulling the snare, as well. The leading edge of the gastrostomy was attached to the snare. It was then pulled down the esophagus and out the percutaneous site. It was secured in place. Contrast was injected. No complication IMPRESSION: Status post fluoroscopic placed percutaneous gastrostomy tube, with 20 [place] pull-through. Signed, Dulcy Fanny. Earleen Newport, DO [facility] Radiology Electronically Signed   By: Corrie Mckusick D.O.   On: 11/13/2016 15:02   Ir Fluoro Guide Cv Line Left  Result Date: 11/13/2016 INDICATION: 60 year old female with bacteremia.  Renal failure. She has been referred for tunneled central catheter placement. EXAM: IMAGE GUIDED TUNNELED CENTRAL VENOUS LINE MEDICATIONS: 2.0 g Ancef; The antibiotic was administered within an appropriate time interval prior to skin puncture. ANESTHESIA/SEDATION: None FLUOROSCOPY TIME:  Fluoroscopy Time: 0 minutes 18 seconds COMPLICATIONS: None PROCEDURE: After written informed consent was obtained, patient was placed in the supine position on angiographic table. Patency of the left internal jugular vein was confirmed with ultrasound with image documentation. Patient was prepped and draped in the usual sterile fashion including the right neck and right superior chest. Using ultrasound guidance, the skin and subcutaneous tissues overlying the right internal jugular vein were generously infiltrated with 1% lidocaine without epinephrine. Using ultrasound guidance, the right internal jugular vein was punctured with a micropuncture needle, and an 018 wire was advanced into the right  heart confirming venous access. A small stab incision was made with an 11 blade scalpel. Peel-away sheath was placed over the wire, and then the wire was removed, marking the wire for estimation of internal catheter length. The chest wall was then generously infiltrated with 1% lidocaine for local anesthesia along the tissue tract. Small stab incision was made with 11 blade scalpel, and then the catheter was back tunneled to the puncture site at the right internal jugular vein. Catheter was pulled through the tract, with the catheter amputated at 22 cm. Catheter was advanced through the peel-away sheath, and the peel-away sheath was removed. Final image was stored. The catheter was anchored to the chest wall with 2 retention sutures, and Derma bond was used to seal the right internal jugular vein incision site and at the right chest wall. Patient tolerated the procedure well and remained hemodynamically stable throughout. No complications were encountered and no significant blood loss was encountered. IMPRESSION: Status post left IJ central venous catheter with a cuffed double lumen 22 cm catheter. Catheter ready for use. Signed, Dulcy Fanny. Earleen Newport, DO [facility] Radiology Electronically Signed   By: Corrie Mckusick D.O.   On: 11/13/2016 15:01   Ir US Guide Vasc Access Left  Result Date: 11/13/2016 INDICATION: 60 year old female with bacteremia.  Renal failure. She has been referred for tunneled central catheter placement. EXAM: IMAGE GUIDED TUNNELED CENTRAL VENOUS LINE MEDICATIONS: 2.0 g Ancef; The antibiotic was administered within an appropriate time interval prior to skin puncture. ANESTHESIA/SEDATION: None FLUOROSCOPY TIME:  Fluoroscopy Time: 0 minutes 18 seconds COMPLICATIONS: None PROCEDURE: After written informed consent was obtained, patient was placed in the supine position on angiographic table. Patency of the left internal jugular vein was confirmed with  ultrasound with image documentation. Patient was prepped and draped in  the usual sterile fashion including the right neck and right superior chest. Using ultrasound guidance, the skin and subcutaneous tissues overlying the right internal jugular vein were generously infiltrated with 1% lidocaine without epinephrine. Using ultrasound guidance, the right internal jugular vein was punctured with a micropuncture needle, and an 018 wire was advanced into the right heart confirming venous access. A small stab incision was made with an 11 blade scalpel. Peel-away sheath was placed over the wire, and then the wire was removed, marking the wire for estimation of internal catheter length. The chest wall was then generously infiltrated with 1% lidocaine for local anesthesia along the tissue tract. Small stab incision was made with 11 blade scalpel, and then the catheter was back tunneled to the puncture site at the right internal jugular vein. Catheter was pulled through the tract, with the catheter amputated at 22 cm. Catheter was advanced through the peel-away sheath, and the peel-away sheath was removed. Final image was stored. The catheter was anchored to the chest wall with 2 retention sutures, and Derma bond was used to seal the right internal jugular vein incision site and at the right chest wall. Patient tolerated the procedure well and remained hemodynamically stable throughout. No complications were encountered and no significant blood loss was encountered. IMPRESSION: Status post left IJ central venous catheter with a cuffed double lumen 22 cm catheter. Catheter ready for use. Signed, Dulcy Fanny. Earleen Newport, DO [facility] Radiology Electronically Signed   By: Corrie Mckusick D.O.   On: 11/13/2016 15:01     Assessment/Plan: MSSA bacteremia HCAP Steroid induced leukocytosis Prev CV surgery (MV bioprosthesis) Vocal cord paralysis, trach redo 12-7 PEG  12-11  improving Continuerifampin (prev LFs normal) HD line out on 12-7 Await TEE Repeat BCx 12-7 ngtd WBC improving  Total days of antibiotics: 7 (ancef/rifampin)         Bobby Rumpf Infectious Diseases (pager) [phone] [email or web address] 11/14/2016, 10:02 AM  LOS: 12 days

## 2016-11-14 NOTE — Progress Notes (Signed)
ANTICOAGULATION CONSULT NOTE - Follow Up Consult  Pharmacy Consult for Heparin  Indication: atrial fibrillation and bioprosthetic MVR  No Known Allergies  Patient Measurements: Height: 5\' 2"  (157.5 cm) Weight: 123 lb 3.8 oz (55.9 kg) IBW/kg (Calculated) : 50.1  Vital Signs: Temp: 98.9 F (37.2 C) (12/12 1230) Temp Source: Oral (12/12 1230) BP: 136/74 (12/12 1400) Pulse Rate: 95 (12/12 1400)  Labs:  Recent Labs  11/12/16 0235 11/13/16 0300 11/14/16 0523 11/14/16 1149  HGB 10.6* 10.8* 10.0*  --   HCT 34.0* 34.0* 31.9*  --   PLT 251 297 329  --   LABPROT 17.7* 16.6* 21.8*  --   INR 1.45 1.34 1.87  --   HEPARINUNFRC <0.10*  --  0.36 0.57  CREATININE 3.03* 4.44* 5.76*  --     Estimated Creatinine Clearance: 8.3 mL/min (by C-G formula based on SCr of 5.76 mg/dL (H)).   Assessment: 60 year old female on IV heparin for atrial fibrillation resumed post G-tube placement on 11/13/16.   Heparin level is therapeutic at 0.57 on 950 units/hr.  CBC stable. No bleeding reported per patient or RN.   Goal of Therapy:  Heparin level 0.3-0.7 units/ml Monitor platelets by anticoagulation protocol: Yes   Plan:  Continue heparin at 950 units/hr. Daily heparin level and CBC Follow-up restart of warfarin as able now with G-tube in place.   Sloan Leiter, PharmD, BCPS Clinical Pharmacist [phone] 11/14/2016,2:07 PM

## 2016-11-14 NOTE — Progress Notes (Signed)
[facility] Progress Note   Subjective:  Awake, alert, NAD. For HD today.   Objective Vitals:   11/14/16 0821 11/14/16 0827 11/14/16 0900 11/14/16 1000  BP: (!) 162/42  (!) 108/48 (!) 133/41  Pulse: 89  83 91  Resp: (!) 24  18 (!) 24  Temp:      TempSrc:      SpO2: 100% 100% 100% 100%  Weight:      Height:       Physical Exam General: Alert, smiles to verbal stimuli.  Heart: S1.S2. RRR  Lungs: Trach to vent, BBS, symmetrical chest excursions. CTAB A/P Abdomen: Soft, non-tender, PEG/NG in place.  Extremities: No LE Edema.  Dialysis Access: LUA AVF + bruit  Additional Objective Labs: Basic Metabolic Panel:  Recent Labs Lab 11/12/16 0235 11/13/16 0300 11/13/16 1043 11/14/16 0523  NA 135 136  --  135  K 3.1* 3.8  --  3.1*  CL 95* 98*  --  96*  CO2 30 23  --  25  GLUCOSE 148* 46* 494* 115*  BUN 19 26*  --  32*  CREATININE 3.03* 4.44*  --  5.76*  CALCIUM 8.6* 8.7*  --  8.5*  PHOS 2.0* 1.7*  --  2.1*   Liver Function Tests: CBC:  Recent Labs Lab 11/10/16 0216 11/11/16 0220 11/12/16 0235 11/13/16 0300 11/14/16 0523  WBC 16.9* 17.4* 18.7* 15.6* 13.6*  HGB 11.5* 10.1* 10.6* 10.8* 10.0*  HCT 36.1 32.4* 34.0* 34.0* 31.9*  MCV 88.3 88.3 87.2 86.1 86.2  PLT 298 253 251 297 329   Blood Culture    Component Value Date/Time   SDES CATH TIP 11/11/2016 1458   SPECREQUEST NONE 11/11/2016 1458   CULT NO GROWTH 3 DAYS 11/11/2016 1458   REPTSTATUS PENDING 11/11/2016 1458    Cardiac Enzymes:  Recent Labs Lab 11/07/16 1902 11/08/16 0433  TROPONINI 0.07* 0.09*   CBG:  Recent Labs Lab 11/13/16 1820 11/13/16 1956 11/13/16 2359 11/14/16 0401 11/14/16 0755  GLUCAP 197* 190* 214* 132* 107*   Studies/Results: Ir Gastrostomy Tube Mod Sed  Result Date: 11/13/2016 INDICATION: 60 year old female with dysphagia. EXAM: PERC PLACEMENT GASTROSTOMY MEDICATIONS: 2.0 g Ancef; Antibiotics were administered within 1 hour of the procedure.  ANESTHESIA/SEDATION: Versed 2.0 mg IV; Fentanyl 100 mcg IV Moderate Sedation Time:  10 The patient was continuously monitored during the procedure by the interventional radiology nurse under my direct supervision. CONTRAST:  10 cc - administered into the gastric lumen. FLUOROSCOPY TIME:  Fluoroscopy Time: 0 minutes 42 seconds (1 mGy). COMPLICATIONS: None PROCEDURE: Informed written consent was obtained from the patient and the patient's family after a thorough discussion of the procedural risks, benefits and alternatives. All questions were addressed. Maximal Sterile Barrier Technique was utilized including caps, mask, sterile gowns, sterile gloves, sterile drape, hand hygiene and skin antiseptic. A timeout was performed prior to the initiation of the procedure. The procedure, risks, benefits, and alternatives were explained to the patient. Questions regarding the procedure were encouraged and answered. The patient understands and consents to the procedure. The epigastrium was prepped with Betadine in a sterile fashion, and a sterile drape was applied covering the operative field. A sterile gown and sterile gloves were used for the procedure. A 5-French orogastric tube is placed under fluoroscopic guidance. Scout imaging of the abdomen confirms barium within the transverse colon. The stomach was distended with gas. Under fluoroscopic guidance, an 18 gauge needle was utilized to puncture the anterior wall of the body of the stomach.  An Amplatz wire was advanced through the needle passing a T fastener into the lumen of the stomach. The T fastener was secured for gastropexy. A 9-French sheath was inserted. A snare was advanced through the 9-French sheath. A Britta Mccreedy was advanced through the orogastric tube. It was snared then pulled out the oral cavity, pulling the snare, as well. The leading edge of the gastrostomy was attached to the snare. It was then pulled down the esophagus and out the percutaneous site. It was  secured in place. Contrast was injected. No complication IMPRESSION: Status post fluoroscopic placed percutaneous gastrostomy tube, with 20 [place] pull-through. Signed, Dulcy Fanny. Earleen Newport, DO [facility] Radiology Electronically Signed   By: Corrie Mckusick D.O.   On: 11/13/2016 15:02   Ir Fluoro Guide Cv Line Left  Result Date: 11/13/2016 INDICATION: 60 year old female with bacteremia.  Renal failure. She has been referred for tunneled central catheter placement. EXAM: IMAGE GUIDED TUNNELED CENTRAL VENOUS LINE MEDICATIONS: 2.0 g Ancef; The antibiotic was administered within an appropriate time interval prior to skin puncture. ANESTHESIA/SEDATION: None FLUOROSCOPY TIME:  Fluoroscopy Time: 0 minutes 18 seconds COMPLICATIONS: None PROCEDURE: After written informed consent was obtained, patient was placed in the supine position on angiographic table. Patency of the left internal jugular vein was confirmed with ultrasound with image documentation. Patient was prepped and draped in the usual sterile fashion including the right neck and right superior chest. Using ultrasound guidance, the skin and subcutaneous tissues overlying the right internal jugular vein were generously infiltrated with 1% lidocaine without epinephrine. Using ultrasound guidance, the right internal jugular vein was punctured with a micropuncture needle, and an 018 wire was advanced into the right heart confirming venous access. A small stab incision was made with an 11 blade scalpel. Peel-away sheath was placed over the wire, and then the wire was removed, marking the wire for estimation of internal catheter length. The chest wall was then generously infiltrated with 1% lidocaine for local anesthesia along the tissue tract. Small stab incision was made with 11 blade scalpel, and then the catheter was back tunneled to the puncture site at the right internal jugular vein. Catheter was pulled through the  tract, with the catheter amputated at 22 cm. Catheter was advanced through the peel-away sheath, and the peel-away sheath was removed. Final image was stored. The catheter was anchored to the chest wall with 2 retention sutures, and Derma bond was used to seal the right internal jugular vein incision site and at the right chest wall. Patient tolerated the procedure well and remained hemodynamically stable throughout. No complications were encountered and no significant blood loss was encountered. IMPRESSION: Status post left IJ central venous catheter with a cuffed double lumen 22 cm catheter. Catheter ready for use. Signed, Dulcy Fanny. Earleen Newport, DO [facility] Radiology Electronically Signed   By: Corrie Mckusick D.O.   On: 11/13/2016 15:01   Ir US Guide Vasc Access Left  Result Date: 11/13/2016 INDICATION: 60 year old female with bacteremia.  Renal failure. She has been referred for tunneled central catheter placement. EXAM: IMAGE GUIDED TUNNELED CENTRAL VENOUS LINE MEDICATIONS: 2.0 g Ancef; The antibiotic was administered within an appropriate time interval prior to skin puncture. ANESTHESIA/SEDATION: None FLUOROSCOPY TIME:  Fluoroscopy Time: 0 minutes 18 seconds COMPLICATIONS: None PROCEDURE: After written informed consent was obtained, patient was placed in the supine position on angiographic table. Patency of the left internal jugular vein was confirmed with ultrasound with image documentation. Patient  was prepped and draped in the usual sterile fashion including the right neck and right superior chest. Using ultrasound guidance, the skin and subcutaneous tissues overlying the right internal jugular vein were generously infiltrated with 1% lidocaine without epinephrine. Using ultrasound guidance, the right internal jugular vein was punctured with a micropuncture needle, and an 018 wire was advanced into the right heart confirming venous access. A small stab  incision was made with an 11 blade scalpel. Peel-away sheath was placed over the wire, and then the wire was removed, marking the wire for estimation of internal catheter length. The chest wall was then generously infiltrated with 1% lidocaine for local anesthesia along the tissue tract. Small stab incision was made with 11 blade scalpel, and then the catheter was back tunneled to the puncture site at the right internal jugular vein. Catheter was pulled through the tract, with the catheter amputated at 22 cm. Catheter was advanced through the peel-away sheath, and the peel-away sheath was removed. Final image was stored. The catheter was anchored to the chest wall with 2 retention sutures, and Derma bond was used to seal the right internal jugular vein incision site and at the right chest wall. Patient tolerated the procedure well and remained hemodynamically stable throughout. No complications were encountered and no significant blood loss was encountered. IMPRESSION: Status post left IJ central venous catheter with a cuffed double lumen 22 cm catheter. Catheter ready for use. Signed, Dulcy Fanny. Earleen Newport, DO [facility] Radiology Electronically Signed   By: Corrie Mckusick D.O.   On: 11/13/2016 15:01   Medications: . sodium chloride 10 mL/hr at 11/13/16 2000  . feeding supplement (VITAL AF 1.2 CAL) Stopped (11/11/16 1829)  . heparin 950 Units/hr (11/14/16 0600)   . amiodarone  200 mg Per Tube Daily  . atorvastatin  20 mg Per Tube q1800  .  ceFAZolin (ANCEF) IV  1 g Intravenous Q24H  . chlorhexidine gluconate (MEDLINE KIT)  15 mL Mouth Rinse BID  . clonazePAM  0.5 mg Per Tube BID  . clopidogrel  75 mg Per Tube Daily  . darbepoetin (ARANESP) injection - DIALYSIS  60 mcg Intravenous Q Sat-HD  . docusate  100 mg Per Tube BID  . ferric gluconate (FERRLECIT/NULECIT) IV  125 mg Intravenous Q T,Th,Sa-HD  . guaiFENesin-codeine  5 mL Per Tube Q6H  . insulin aspart   0-15 Units Subcutaneous Q4H  . mouth rinse  15 mL Mouth Rinse QID  . metoCLOPramide  10 mg Per Tube BID  . midodrine  10 mg Per Tube q morning - 10a  . multivitamin  1 tablet Oral QHS  . pantoprazole sodium  40 mg Per Tube Daily  . rifampin  300 mg Per Tube Q8H  . sennosides  5 mL Per Tube BID  . 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 Per PCCM.  3.[facility] HD started HD Sept 2017. For HD today.  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 Run even today.  7. Nutrition: Albumin 1.9. Tube feeding on hold. Peg in place.   Rita H. Brown NP-C 11/14/2016, 11:34 AM  [facility] [phone]  Pt seen, examined and agree w A/P as above.  Kelly Splinter MD [employer] pager [phone]   11/14/2016, 2:47 PM

## 2016-11-14 NOTE — Progress Notes (Signed)
Pt placed back on full support at this time due to increased RR >35 & inc WOB, RT will monitor

## 2016-11-14 NOTE — Progress Notes (Signed)
Pt having periods of apnea without stimulation to keep awake.  Placed back on full support at this time, will try wean later this am when pt more awake

## 2016-11-14 NOTE — Progress Notes (Signed)
Referring Physician(s): Nestor,J  Supervising Physician: Marybelle Killings  Patient Status:  [facility] - In-pt  Chief Complaint:  Dysphagia/resp failure  Subjective: Pt without acute changes   Allergies: Patient has no known allergies.  Medications: Prior to Admission medications   Medication Sig Start Date End Date Taking? Authorizing Provider  amiodarone (PACERONE) 200 MG tablet Take 1 tablet (200 mg total) by mouth daily. 11/01/16  Yes Ivan Anchors Love, PA-C  clopidogrel (PLAVIX) 75 MG tablet Take 1 tablet (75 mg total) by mouth daily. 11/01/16  Yes Ivan Anchors Love, PA-C  guaiFENesin (ROBITUSSIN) 100 MG/5ML SOLN Take 30 mLs (600 mg total) by mouth every 6 (six) hours. 11/01/16  Yes Ivan Anchors Love, PA-C  albuterol (PROVENTIL) (2.5 MG/3ML) 0.083% nebulizer solution Take 3 mLs (2.5 mg total) by nebulization 4 (four) times daily - after meals and at bedtime. 11/01/16   Bary Leriche, PA-C  ALPRAZolam (XANAX) 0.25 MG tablet Take 1 tablet (0.25 mg total) by mouth 2 (two) times daily as needed for anxiety. 11/01/16   Ivan Anchors Love, PA-C  atorvastatin (LIPITOR) 20 MG tablet Take 1 tablet (20 mg total) by mouth daily. 11/01/16   Bary Leriche, PA-C  cefdinir (OMNICEF) 125 MG/5ML suspension Take 12 mLs (300 mg total) by mouth every other day. 11/02/16   Bary Leriche, PA-C  hydrocortisone (ANUSOL-HC) 2.5 % rectal cream Place rectally 2 (two) times daily as needed for hemorrhoids or itching. 11/01/16   Bary Leriche, PA-C  Insulin Detemir (LEVEMIR) 100 UNIT/ML Pen Inject 8 Units into the skin daily at 10 pm. 11/01/16   Ivan Anchors Love, PA-C  lanthanum (FOSRENOL) 1000 MG chewable tablet Chew 1 tablet (1,000 mg total) by mouth 3 (three) times daily with meals. 11/01/16 12/01/16  Bary Leriche, PA-C  metoCLOPramide (REGLAN) 5 MG tablet Take 1 tablet (5 mg total) by mouth 2 (two) times daily with a meal. 11/01/16   Ivan Anchors Love, PA-C  midodrine (PROAMATINE) 10 MG tablet Take 1 tablet (10 mg total) by mouth  every morning. 11/02/16   Bary Leriche, PA-C  multivitamin (RENA-VIT) TABS tablet Take 1 tablet by mouth at bedtime. 11/01/16 12/01/16  Ivan Anchors Love, PA-C  pantoprazole (PROTONIX) 40 MG tablet Take 1 tablet (40 mg total) by mouth 2 (two) times daily. 11/01/16   Bary Leriche, PA-C  warfarin (COUMADIN) 2 MG tablet Take 2 mg on Tue, Thu, Sat, Sun 11/01/16   Ivan Anchors Love, PA-C  warfarin (COUMADIN) 4 MG tablet Take 4 mg on Mon, Wed, Fri with supper 11/01/16   Bary Leriche, PA-C     Vital Signs: BP (!) 162/42   Pulse 89   Temp 97.8 F (36.6 C) (Oral)   Resp (!) 24   Ht 5\' 2"  (1.575 m)   Wt 125 lb 3.5 oz (56.8 kg)   SpO2 100%   BMI 22.90 kg/m   Physical Exam G tube intact, insertion site ok, abd soft,ND; left IJ cath ok, no hematoma  Imaging: Ir Gastrostomy Tube Mod Sed  Result Date: 11/13/2016 INDICATION: 60 year old female with dysphagia. EXAM: PERC PLACEMENT GASTROSTOMY MEDICATIONS: 2.0 g Ancef; Antibiotics were administered within 1 hour of the procedure. ANESTHESIA/SEDATION: Versed 2.0 mg IV; Fentanyl 100 mcg IV Moderate Sedation Time:  10 The patient was continuously monitored during the procedure by the interventional radiology nurse under my direct supervision. CONTRAST:  10 cc - administered into the gastric lumen. FLUOROSCOPY TIME:  Fluoroscopy Time: 0 minutes 42 seconds (  1 mGy). COMPLICATIONS: None PROCEDURE: Informed written consent was obtained from the patient and the patient's family after a thorough discussion of the procedural risks, benefits and alternatives. All questions were addressed. Maximal Sterile Barrier Technique was utilized including caps, mask, sterile gowns, sterile gloves, sterile drape, hand hygiene and skin antiseptic. A timeout was performed prior to the initiation of the procedure. The procedure, risks, benefits, and alternatives were explained to the patient. Questions regarding the procedure were encouraged and answered. The patient understands and  consents to the procedure. The epigastrium was prepped with Betadine in a sterile fashion, and a sterile drape was applied covering the operative field. A sterile gown and sterile gloves were used for the procedure. A 5-French orogastric tube is placed under fluoroscopic guidance. Scout imaging of the abdomen confirms barium within the transverse colon. The stomach was distended with gas. Under fluoroscopic guidance, an 18 gauge needle was utilized to puncture the anterior wall of the body of the stomach. An Amplatz wire was advanced through the needle passing a T fastener into the lumen of the stomach. The T fastener was secured for gastropexy. A 9-French sheath was inserted. A snare was advanced through the 9-French sheath. A Britta Mccreedy was advanced through the orogastric tube. It was snared then pulled out the oral cavity, pulling the snare, as well. The leading edge of the gastrostomy was attached to the snare. It was then pulled down the esophagus and out the percutaneous site. It was secured in place. Contrast was injected. No complication IMPRESSION: Status post fluoroscopic placed percutaneous gastrostomy tube, with 20 [place] pull-through. Signed, Dulcy Fanny. Earleen Newport, DO [facility] Radiology Electronically Signed   By: Corrie Mckusick D.O.   On: 11/13/2016 15:02   Ir Fluoro Guide Cv Line Left  Result Date: 11/13/2016 INDICATION: 60 year old female with bacteremia.  Renal failure. She has been referred for tunneled central catheter placement. EXAM: IMAGE GUIDED TUNNELED CENTRAL VENOUS LINE MEDICATIONS: 2.0 g Ancef; The antibiotic was administered within an appropriate time interval prior to skin puncture. ANESTHESIA/SEDATION: None FLUOROSCOPY TIME:  Fluoroscopy Time: 0 minutes 18 seconds COMPLICATIONS: None PROCEDURE: After written informed consent was obtained, patient was placed in the supine position on angiographic table. Patency of the left internal jugular  vein was confirmed with ultrasound with image documentation. Patient was prepped and draped in the usual sterile fashion including the right neck and right superior chest. Using ultrasound guidance, the skin and subcutaneous tissues overlying the right internal jugular vein were generously infiltrated with 1% lidocaine without epinephrine. Using ultrasound guidance, the right internal jugular vein was punctured with a micropuncture needle, and an 018 wire was advanced into the right heart confirming venous access. A small stab incision was made with an 11 blade scalpel. Peel-away sheath was placed over the wire, and then the wire was removed, marking the wire for estimation of internal catheter length. The chest wall was then generously infiltrated with 1% lidocaine for local anesthesia along the tissue tract. Small stab incision was made with 11 blade scalpel, and then the catheter was back tunneled to the puncture site at the right internal jugular vein. Catheter was pulled through the tract, with the catheter amputated at 22 cm. Catheter was advanced through the peel-away sheath, and the peel-away sheath was removed. Final image was stored. The catheter was anchored to the chest wall with 2 retention sutures, and Derma bond was used to seal the right internal jugular vein incision site and at the right  chest wall. Patient tolerated the procedure well and remained hemodynamically stable throughout. No complications were encountered and no significant blood loss was encountered. IMPRESSION: Status post left IJ central venous catheter with a cuffed double lumen 22 cm catheter. Catheter ready for use. Signed, Dulcy Fanny. Earleen Newport, DO [facility] Radiology Electronically Signed   By: Corrie Mckusick D.O.   On: 11/13/2016 15:01   Ir US Guide Vasc Access Left  Result Date: 11/13/2016 INDICATION: 60 year old female with bacteremia.  Renal failure. She has been referred for  tunneled central catheter placement. EXAM: IMAGE GUIDED TUNNELED CENTRAL VENOUS LINE MEDICATIONS: 2.0 g Ancef; The antibiotic was administered within an appropriate time interval prior to skin puncture. ANESTHESIA/SEDATION: None FLUOROSCOPY TIME:  Fluoroscopy Time: 0 minutes 18 seconds COMPLICATIONS: None PROCEDURE: After written informed consent was obtained, patient was placed in the supine position on angiographic table. Patency of the left internal jugular vein was confirmed with ultrasound with image documentation. Patient was prepped and draped in the usual sterile fashion including the right neck and right superior chest. Using ultrasound guidance, the skin and subcutaneous tissues overlying the right internal jugular vein were generously infiltrated with 1% lidocaine without epinephrine. Using ultrasound guidance, the right internal jugular vein was punctured with a micropuncture needle, and an 018 wire was advanced into the right heart confirming venous access. A small stab incision was made with an 11 blade scalpel. Peel-away sheath was placed over the wire, and then the wire was removed, marking the wire for estimation of internal catheter length. The chest wall was then generously infiltrated with 1% lidocaine for local anesthesia along the tissue tract. Small stab incision was made with 11 blade scalpel, and then the catheter was back tunneled to the puncture site at the right internal jugular vein. Catheter was pulled through the tract, with the catheter amputated at 22 cm. Catheter was advanced through the peel-away sheath, and the peel-away sheath was removed. Final image was stored. The catheter was anchored to the chest wall with 2 retention sutures, and Derma bond was used to seal the right internal jugular vein incision site and at the right chest wall. Patient tolerated the procedure well and remained hemodynamically stable throughout. No complications were encountered and no significant blood  loss was encountered. IMPRESSION: Status post left IJ central venous catheter with a cuffed double lumen 22 cm catheter. Catheter ready for use. Signed, Dulcy Fanny. Earleen Newport, DO [facility] Radiology Electronically Signed   By: Corrie Mckusick D.O.   On: 11/13/2016 15:01    Labs:  CBC:  Recent Labs  11/11/16 0220 11/12/16 0235 11/13/16 0300 11/14/16 0523  WBC 17.4* 18.7* 15.6* 13.6*  HGB 10.1* 10.6* 10.8* 10.0*  HCT 32.4* 34.0* 34.0* 31.9*  PLT 253 251 297 329    COAGS:  Recent Labs  08/23/16 1553 08/27/16 0321 08/28/16 0429 08/29/16 0431  11/11/16 0220 11/12/16 0235 11/13/16 0300 11/14/16 0523  INR 1.50  --   --   --   < > 1.48 1.45 1.34 1.87  APTT 40* 123* 156* 194*  --   --   --   --   --   < > = values in this interval not displayed.  BMP:  Recent Labs  11/11/16 0220 11/12/16 0235 11/13/16 0300 11/13/16 1043 11/14/16 0523  NA 136 135 136  --  135  K 3.8 3.1* 3.8  --  3.1*  CL 102 95* 98*  --  96*  CO2 21* 30 23  --  25  GLUCOSE 198* 148* 46* 494* 115*  BUN 46* 19 26*  --  32*  CALCIUM 8.4* 8.6* 8.7*  --  8.5*  CREATININE 4.35* 3.03* 4.44*  --  5.76*  GFRNONAA 10* 16* 10*  --  7*  GFRAA 12* 18* 12*  --  8*    LIVER FUNCTION TESTS:  Recent Labs  09/25/16 0400  10/17/16 0603  11/02/16 1810  11/07/16 1902  11/11/16 0220 11/12/16 0235 11/13/16 0300 11/14/16 0523  BILITOT 0.8  --  1.5*  --  1.0  --  0.9  --   --   --   --   --   AST 33  --  14*  --  28  --  22  --   --   --   --   --   ALT 36  --  10*  --  19  --  21  --   --   --   --   --   ALKPHOS 111  --  81  --  68  --  69  --   --   --   --   --   PROT 6.5  --  6.7  --  7.0  --  7.5  --   --   --   --   --   ALBUMIN 2.6*  < > 2.8*  < > 2.8*  < > 3.1*  < > 2.0* 2.1* 2.1* 1.9*  < > = values in this interval not displayed.  Assessment and Plan: VDRF/dysphagia; s/p perc G tube/left IJ cath placement 12/11; AF; WBC 13.6(15.6), hgb 10.0(10.8), K 3.1-  replace; creat 5.76(4.44); ok to use tube for feeds; resume anticoagulation   Electronically Signed: D. Rowe Robert 11/14/2016, 9:05 AM   I spent a total of 15 minutes at the the patient's bedside AND on the patient's hospital floor or unit, greater than 50% of which was counseling/coordinating care for gastrostomy tube placement    Patient ID: Harrison Mons, female   DOB: 1956/01/08, 60 y.o.   MRN: [id number]

## 2016-11-14 NOTE — Consult Note (Signed)
Consultation Note Date: 11/14/2016   Patient Name: Deborah [name]  DOB: 1955/12/23  MRN: [id number]  Age / Sex: 60 y.o., female  PCP: Jackolyn Confer, MD Referring Physician: Allie Bossier, MD  Reason for Consultation: Establishing goals of care  HPI/Patient Profile: 60 y.o. female  with past medical history of DM, HTN,  ESRD (now on perm HD),  admitted on 11/02/2016 with shortness of breath. She recently had a prolonged hospitalization spanning September through November d/t STEMI with papillary muscle rupture. During that stay she required trach, but was able to be decannulated and returned home after going to CIR. She was at home for less than a day when she returned to ED with complaints of increasing SOB. 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 She now also has HCAP with MSSA bacteremia.   Timeline 9/12- original admission to [facility] with NSTEMI, ARF, metabolic acidosis- intubated, cardiac cath, CRRT 9/15- transferred to [facility] for care 9/21- valve replacement surgery 9/28-HD initiated 9/29- extubated, then reintubated d/t septic shock (bacteremia), pressors 10/2- Atrial flutter- tx with transesophogeal guided electrocardioversion 10/5 tracheostomy, weaned to ATC, tolerated passey-muir for short periods of time 10/23- tolerated dysphagia 2 diet w/ nectar liquids 10/25- decannulated 10/30- tunneled HD cath placed, HD dependent 11/8- admitted to [place] 11/29-dc'd home 11/30- (current admission) to ED with increasing dyspnea 12/2 ENT eval- rec trach for vocal coord dysfunction causing airway obstruction 12/6 CXR- HCAP, blood cultures positive for MSSA 12/7- Tracheostomy, hypotensive requiring dopamine titration, full ventilator support 12/8- TF via OGT started, failing to thrive 12/9- Tunneled dialysis catheter removed d/t infection, HD by  graft 12/11- remains on full vent support, PEG placed 12/12- Palliative consult  Clinical Assessment and Goals of Care: I met with patient at bedside. She was alert and oriented. We discussed her current status and GOC with her communicating via handwriting. Her current goals are to return home by Christmas. Discussed that she may need to consider reframing her GOC as she has had difficulty liberating from ventilator. At this point it is possible she will require long term care and there are limited options for where a patient can go requiring HD and ventilator. Placement may require her to reside out of state. Patient asked when her trach will come out. Discussed that it is possible she may require trach for lifetime. Her response was that she did not want to "live like this". She requested a second opinion regarding her prognosis and stated "I didn't come here to die." We discussed that while she will continue to live with current supports (trach, vent, HD, TF) that it is also reasonable to consider her quality of life and what is acceptable to her.She requested that palliative medicine consult with her family and explain her current situation to them.  This patient has high risk for mortality with FTT albumin of 1.5, prolonged mechanical ventilation with comorbidities including ESRD- on HD, and septic shock. I spoke with patient's son, [name] and he is available for  family meeting on 12/14 at [place].  Primary decision maker:  PATIENT    SUMMARY OF RECOMMENDATIONS -Continue current level of care -Palliative medicine will continue to follow and discuss [place] with patient and her family    Code Status/Advance Care Planning:  Full code   Palliative Prophylaxis:   Frequent Pain Assessment  Additional Recommendations (Limitations, Scope, Preferences):  Full Scope Treatment  Psycho-social/Spiritual:   Desire for further Chaplaincy support:No  Additional Recommendations: Caregiving   Support/Resources  Prognosis:   Unable to determine  Discharge Planning: To Be Determined      Primary Diagnoses: Present on Admission: . Respiratory distress . Anemia of chronic disease . Atrial flutter ([place]) . HCAP (healthcare-associated pneumonia) . Mitral regurgitation . Staphylococcus aureus bacteremia   I have reviewed the medical record, interviewed the patient and family, and examined the patient. The following aspects are pertinent.  Past Medical History:  Diagnosis Date  . Diabetes mellitus   . Hyperlipidemia   . Hypertension   . MI, old   . Renal disorder    dialysls   Social History   Social History  . Marital status: Widowed    Spouse name: N/A  . Number of children: 6  . Years of education: N/A   Social History Main Topics  . Smoking status: Never Smoker  . Smokeless tobacco: Never Used  . Alcohol use No  . Drug use: No  . Sexual activity: Not Asked   Other Topics Concern  . None   Social History Narrative   Lives in [place]. Has 2 daughtesr, 3 grandkids.   Family History  Problem Relation Age of Onset  . Hypertension Mother   . Diabetes Mother   . Breast cancer Sister    Scheduled Meds: . amiodarone  200 mg Per Tube Daily  . atorvastatin  20 mg Per Tube q1800  .  ceFAZolin (ANCEF) IV  1 g Intravenous Q24H  . chlorhexidine gluconate (MEDLINE KIT)  15 mL Mouth Rinse BID  . clonazePAM  0.5 mg Per Tube BID  . clopidogrel  75 mg Per Tube Daily  . darbepoetin (ARANESP) injection - DIALYSIS  60 mcg Intravenous Q Sat-HD  . docusate  100 mg Per Tube BID  . ferric gluconate (FERRLECIT/NULECIT) IV  125 mg Intravenous Q T,Th,Sa-HD  . guaiFENesin-codeine  5 mL Per Tube Q6H  . insulin aspart  0-15 Units Subcutaneous Q4H  . mouth rinse  15 mL Mouth Rinse QID  . metoCLOPramide  10 mg Per Tube BID  . midodrine  10 mg Per Tube q morning - 10a  . multivitamin  1 tablet Oral QHS  . neomycin-bacitracin-polymyxin  1 application Topical Daily  .  pantoprazole sodium  40 mg Per Tube Daily  . rifampin  300 mg Per Tube Q8H  . sennosides  5 mL Per Tube BID  . sodium chloride flush  3 mL Intravenous Q12H   Continuous Infusions: . sodium chloride 10 mL/hr at 11/14/16 1500  . feeding supplement (VITAL AF 1.2 CAL) Stopped (11/11/16 1829)  . heparin 950 Units/hr (11/14/16 1500)   PRN Meds:.fentaNYL (SUBLIMAZE) injection, fluticasone, levalbuterol, ondansetron (ZOFRAN) IV, prochlorperazine Medications Prior to Admission:  Prior to Admission medications   Medication Sig Start Date End Date Taking? Authorizing Provider  amiodarone (PACERONE) 200 MG tablet Take 1 tablet (200 mg total) by mouth daily. 11/01/16  Yes Ivan Anchors Love, PA-C  clopidogrel (PLAVIX) 75 MG tablet Take 1 tablet (75 mg total) by mouth daily. 11/01/16  Yes Olin Hauser  S Love, PA-C  guaiFENesin (ROBITUSSIN) 100 MG/5ML SOLN Take 30 mLs (600 mg total) by mouth every 6 (six) hours. 11/01/16  Yes Ivan Anchors Love, PA-C  albuterol (PROVENTIL) (2.5 MG/3ML) 0.083% nebulizer solution Take 3 mLs (2.5 mg total) by nebulization 4 (four) times daily - after meals and at bedtime. 11/01/16   Bary Leriche, PA-C  ALPRAZolam (XANAX) 0.25 MG tablet Take 1 tablet (0.25 mg total) by mouth 2 (two) times daily as needed for anxiety. 11/01/16   Ivan Anchors Love, PA-C  atorvastatin (LIPITOR) 20 MG tablet Take 1 tablet (20 mg total) by mouth daily. 11/01/16   Bary Leriche, PA-C  cefdinir (OMNICEF) 125 MG/5ML suspension Take 12 mLs (300 mg total) by mouth every other day. 11/02/16   Bary Leriche, PA-C  hydrocortisone (ANUSOL-HC) 2.5 % rectal cream Place rectally 2 (two) times daily as needed for hemorrhoids or itching. 11/01/16   Bary Leriche, PA-C  Insulin Detemir (LEVEMIR) 100 UNIT/ML Pen Inject 8 Units into the skin daily at 10 pm. 11/01/16   Ivan Anchors Love, PA-C  lanthanum (FOSRENOL) 1000 MG chewable tablet Chew 1 tablet (1,000 mg total) by mouth 3 (three) times daily with meals. 11/01/16 12/01/16  Bary Leriche, PA-C  metoCLOPramide (REGLAN) 5 MG tablet Take 1 tablet (5 mg total) by mouth 2 (two) times daily with a meal. 11/01/16   Ivan Anchors Love, PA-C  midodrine (PROAMATINE) 10 MG tablet Take 1 tablet (10 mg total) by mouth every morning. 11/02/16   Bary Leriche, PA-C  multivitamin (RENA-VIT) TABS tablet Take 1 tablet by mouth at bedtime. 11/01/16 12/01/16  Ivan Anchors Love, PA-C  pantoprazole (PROTONIX) 40 MG tablet Take 1 tablet (40 mg total) by mouth 2 (two) times daily. 11/01/16   Bary Leriche, PA-C  warfarin (COUMADIN) 2 MG tablet Take 2 mg on Tue, Thu, Sat, Sun 11/01/16   Ivan Anchors Love, PA-C  warfarin (COUMADIN) 4 MG tablet Take 4 mg on Mon, Wed, Fri with supper 11/01/16   Ivan Anchors Love, PA-C   No Known Allergies Review of Systems  All other systems reviewed and are negative.   Physical Exam  Constitutional: She is oriented to person, place, and time.  Frail, appears older than stated age  HENT:  Unable to speak d/t trach  Cardiovascular:  Murmur heard. tachycardic  Pulmonary/Chest: Effort normal and breath sounds normal.  Trach with full ventilator support  Abdominal: Soft. Bowel sounds are normal.  Musculoskeletal: She exhibits edema.  Generalized weakness  Neurological: She is alert and oriented to person, place, and time.  Skin: Skin is warm and dry.  Psychiatric: She has a normal mood and affect. Her behavior is normal. Judgment and thought content normal.    Vital Signs: BP (!) 147/64   Pulse 100   Temp 98.7 F (37.1 C) (Oral)   Resp 16   Ht 5' 2"  (1.575 m)   Wt 55.9 kg (123 lb 3.8 oz)   SpO2 100%   BMI 22.54 kg/m  Pain Assessment: CPOT   Pain Score: Asleep   SpO2: SpO2: 100 % O2 Device:SpO2: 100 % O2 Flow Rate: .O2 Flow Rate (L/min): 3 L/min  IO: Intake/output summary:  Intake/Output Summary (Last 24 hours) at 11/14/16 1621 Last data filed at 11/14/16 1500  Gross per 24 hour  Intake           637.54 ml  Output  0 ml  Net           637.54  ml    LBM: Last BM Date: 11/13/16 Baseline Weight: Weight: 55.3 kg (122 lb) Most recent weight: Weight: 55.9 kg (123 lb 3.8 oz)     Palliative Assessment/Data:   Flowsheet Rows   Flowsheet Row Most Recent Value  Intake Tab  Referral Department  Hospitalist  Unit at Time of Referral  ICU  Palliative Care Primary Diagnosis  Cardiac  Date Notified  11/14/16  Palliative Care Type  New Palliative care  Reason for referral  Clarify Goals of Care  Date of Admission  11/02/16  # of days IP prior to Palliative referral  12  Clinical Assessment  Psychosocial & Spiritual Assessment  Palliative Care Outcomes      Time In: 1400 Time Out: 1600 Time Total: 120 minutes Greater than 50%  of this time was spent counseling and coordinating care related to the above assessment and plan.  Signed by: Payton Emerald, NP   Please contact Palliative Medicine Team phone at [phone] for questions and concerns.  For individual provider: See Shea Evans

## 2016-11-14 NOTE — Progress Notes (Signed)
ANTICOAGULATION CONSULT NOTE - Follow Up Consult  Pharmacy Consult for Heparin  Indication: atrial fibrillation and bioprosthetic MVR  No Known Allergies  Patient Measurements: Height: 5\' 2"  (157.5 cm) Weight: 125 lb 3.5 oz (56.8 kg) IBW/kg (Calculated) : 50.1  Vital Signs: Temp: 98.4 F (36.9 C) (12/12 0337) Temp Source: Oral (12/12 0337) BP: 118/56 (12/12 0600) Pulse Rate: 84 (12/12 0600)  Labs:  Recent Labs  11/12/16 0235 11/13/16 0300 11/14/16 0523  HGB 10.6* 10.8* 10.0*  HCT 34.0* 34.0* 31.9*  PLT 251 297 329  LABPROT 17.7* 16.6* 21.8*  INR 1.45 1.34 1.87  HEPARINUNFRC <0.10*  --  0.36  CREATININE 3.03* 4.44* 5.76*    Estimated Creatinine Clearance: 8.3 mL/min (by C-G formula based on SCr of 5.76 mg/dL (H)).   Assessment: Therapeutic heparin level x 1 after re-start s/p G-tube placement  Goal of Therapy:  Heparin level 0.3-0.7 units/ml Monitor platelets by anticoagulation protocol: Yes   Plan:  -Cont heparin at 950 units/hr -1200 HL  Narda Bonds 11/14/2016,6:24 AM

## 2016-11-15 DIAGNOSIS — J383 Other diseases of vocal cords: Secondary | ICD-10-CM

## 2016-11-15 DIAGNOSIS — Z7189 Other specified counseling: Secondary | ICD-10-CM

## 2016-11-15 DIAGNOSIS — Z515 Encounter for palliative care: Secondary | ICD-10-CM

## 2016-11-15 LAB — RENAL FUNCTION PANEL
ANION GAP: 11 (ref 5–15)
Albumin: 1.9 g/dL — ABNORMAL LOW (ref 3.5–5.0)
BUN: 8 mg/dL (ref 6–20)
CALCIUM: 8 mg/dL — AB (ref 8.9–10.3)
CO2: 27 mmol/L (ref 22–32)
Chloride: 98 mmol/L — ABNORMAL LOW (ref 101–111)
Creatinine, Ser: 2.83 mg/dL — ABNORMAL HIGH (ref 0.44–1.00)
GFR calc Af Amer: 20 mL/min — ABNORMAL LOW (ref >60)
GFR calc non Af Amer: 17 mL/min — ABNORMAL LOW (ref >60)
GLUCOSE: 68 mg/dL (ref 65–99)
Phosphorus: 1.2 mg/dL — ABNORMAL LOW (ref 2.5–4.6)
Potassium: 3.6 mmol/L (ref 3.5–5.1)
SODIUM: 136 mmol/L (ref 135–145)

## 2016-11-15 LAB — GLUCOSE, CAPILLARY
GLUCOSE-CAPILLARY: 162 mg/dL — AB (ref 65–99)
GLUCOSE-CAPILLARY: 87 mg/dL (ref 65–99)
GLUCOSE-CAPILLARY: 94 mg/dL (ref 65–99)
Glucose-Capillary: 73 mg/dL (ref 65–99)
Glucose-Capillary: 117 mg/dL — ABNORMAL HIGH (ref 65–99)
Glucose-Capillary: 122 mg/dL — ABNORMAL HIGH (ref 65–99)

## 2016-11-15 LAB — PROTIME-INR
INR: 2.16
PROTHROMBIN TIME: 24.4 s — AB (ref 11.4–15.2)

## 2016-11-15 LAB — CBC
HCT: 31.4 % — ABNORMAL LOW (ref 36.0–46.0)
HEMOGLOBIN: 10 g/dL — AB (ref 12.0–15.0)
MCH: 27.3 pg (ref 26.0–34.0)
MCHC: 31.8 g/dL (ref 30.0–36.0)
MCV: 85.8 fL (ref 78.0–100.0)
PLATELETS: 350 10*3/uL (ref 150–400)
RBC: 3.66 MIL/uL — ABNORMAL LOW (ref 3.87–5.11)
RDW: 18.6 % — ABNORMAL HIGH (ref 11.5–15.5)
WBC: 15.6 10*3/uL — ABNORMAL HIGH (ref 4.0–10.5)

## 2016-11-15 LAB — CATH TIP CULTURE: Culture: NO GROWTH

## 2016-11-15 LAB — HEPARIN LEVEL (UNFRACTIONATED): Heparin Unfractionated: 0.59 IU/mL (ref 0.30–0.70)

## 2016-11-15 LAB — MAGNESIUM: Magnesium: 2.1 mg/dL (ref 1.7–2.4)

## 2016-11-15 LAB — HEPATITIS B SURFACE ANTIGEN: HEP B S AG: NEGATIVE

## 2016-11-15 MED ORDER — WARFARIN SODIUM 4 MG PO TABS
4.0000 mg | ORAL_TABLET | Freq: Once | ORAL | Status: AC
  Administered 2016-11-15: 4 mg
  Filled 2016-11-15: qty 1

## 2016-11-15 MED ORDER — K PHOS MONO-SOD PHOS DI & MONO 155-852-130 MG PO TABS
500.0000 mg | ORAL_TABLET | Freq: Three times a day (TID) | ORAL | Status: DC
  Administered 2016-11-15 (×3): 500 mg via ORAL
  Filled 2016-11-15 (×4): qty 2

## 2016-11-15 MED ORDER — INSULIN ASPART 100 UNIT/ML ~~LOC~~ SOLN
0.0000 [IU] | SUBCUTANEOUS | Status: DC
  Administered 2016-11-15: 1 [IU] via SUBCUTANEOUS
  Administered 2016-11-16 (×2): 5 [IU] via SUBCUTANEOUS
  Administered 2016-11-16: 2 [IU] via SUBCUTANEOUS
  Administered 2016-11-16 (×2): 3 [IU] via SUBCUTANEOUS
  Administered 2016-11-16: 1 [IU] via SUBCUTANEOUS
  Administered 2016-11-17: 5 [IU] via SUBCUTANEOUS
  Administered 2016-11-17 (×3): 3 [IU] via SUBCUTANEOUS
  Administered 2016-11-17: 5 [IU] via SUBCUTANEOUS
  Administered 2016-11-17: 7 [IU] via SUBCUTANEOUS
  Administered 2016-11-18: 3 [IU] via SUBCUTANEOUS
  Administered 2016-11-18: 7 [IU] via SUBCUTANEOUS
  Administered 2016-11-18: 2 [IU] via SUBCUTANEOUS
  Administered 2016-11-18: 3 [IU] via SUBCUTANEOUS
  Administered 2016-11-18: 7 [IU] via SUBCUTANEOUS
  Administered 2016-11-18: 9 [IU] via SUBCUTANEOUS
  Administered 2016-11-19 (×2): 5 [IU] via SUBCUTANEOUS
  Administered 2016-11-19: 7 [IU] via SUBCUTANEOUS
  Administered 2016-11-19 (×2): 5 [IU] via SUBCUTANEOUS
  Administered 2016-11-19 – 2016-11-20 (×2): 7 [IU] via SUBCUTANEOUS
  Administered 2016-11-20: 9 [IU] via SUBCUTANEOUS
  Administered 2016-11-20: 5 [IU] via SUBCUTANEOUS
  Administered 2016-11-20: 7 [IU] via SUBCUTANEOUS
  Administered 2016-11-20 (×2): 5 [IU] via SUBCUTANEOUS
  Administered 2016-11-21: 7 [IU] via SUBCUTANEOUS
  Administered 2016-11-21 (×2): 3 [IU] via SUBCUTANEOUS
  Administered 2016-11-21: 7 [IU] via SUBCUTANEOUS
  Administered 2016-11-21: 3 [IU] via SUBCUTANEOUS
  Administered 2016-11-21: 5 [IU] via SUBCUTANEOUS
  Administered 2016-11-22: 3 [IU] via SUBCUTANEOUS
  Administered 2016-11-22: 7 [IU] via SUBCUTANEOUS
  Administered 2016-11-22: 9 [IU] via SUBCUTANEOUS
  Administered 2016-11-22: 7 [IU] via SUBCUTANEOUS
  Administered 2016-11-22: 9 [IU] via SUBCUTANEOUS
  Administered 2016-11-23: 1 [IU] via SUBCUTANEOUS
  Administered 2016-11-23: 3 [IU] via SUBCUTANEOUS
  Administered 2016-11-23 (×2): 2 [IU] via SUBCUTANEOUS
  Administered 2016-11-23: 7 [IU] via SUBCUTANEOUS
  Administered 2016-11-24: 1 [IU] via SUBCUTANEOUS
  Administered 2016-11-24: 6 [IU] via SUBCUTANEOUS
  Administered 2016-11-24: 2 [IU] via SUBCUTANEOUS
  Administered 2016-11-24: 1 [IU] via SUBCUTANEOUS
  Administered 2016-11-24 – 2016-11-25 (×2): 2 [IU] via SUBCUTANEOUS
  Administered 2016-11-25 (×2): 1 [IU] via SUBCUTANEOUS
  Administered 2016-11-26: 5 [IU] via SUBCUTANEOUS
  Administered 2016-11-26: 1 [IU] via SUBCUTANEOUS

## 2016-11-15 MED ORDER — FENTANYL CITRATE (PF) 100 MCG/2ML IJ SOLN
25.0000 ug | INTRAMUSCULAR | Status: DC | PRN
  Administered 2016-11-15 – 2016-11-20 (×3): 50 ug via INTRAVENOUS
  Administered 2016-11-21: 25 ug via INTRAVENOUS
  Administered 2016-11-22: 50 ug via INTRAVENOUS
  Filled 2016-11-15 (×5): qty 2

## 2016-11-15 MED ORDER — WARFARIN - PHARMACIST DOSING INPATIENT
Freq: Every day | Status: DC
  Administered 2016-11-15: 1
  Administered 2016-11-20 – 2016-11-22 (×2)
  Administered 2016-11-24: 1
  Administered 2016-11-25: 18:00:00

## 2016-11-15 NOTE — Progress Notes (Signed)
6 Days Post-Op  Subjective: She is still on the vent. No bleeding from trach and seems to be ventilating well  Objective: Vital signs in last 24 hours: Temp:  [98.1 F (36.7 C)-99 F (37.2 C)] 98.2 F (36.8 C) (12/13 0447) Pulse Rate:  [71-108] 89 (12/13 0810) Resp:  [12-33] 23 (12/13 0810) BP: (92-163)/(32-98) 96/59 (12/13 0700) SpO2:  [92 %-100 %] 100 % (12/13 0810) FiO2 (%):  [40 %] 40 % (12/13 0810) Weight:  [55.9 kg (123 lb 3.8 oz)] 55.9 kg (123 lb 3.8 oz) (12/13 0447) Last BM Date: 11/13/16  Intake/Output from previous day: 12/12 0701 - 12/13 0700 In: 686.2 [I.V.:442; NG/GT:144.2; IV Piggyback:100] Out: 0  Intake/Output this shift: No intake/output data recorded.  trach is clean and no bleeding. good airway. vent attached. no swelling.   Lab Results:   Recent Labs  11/14/16 0523 11/15/16 0500  WBC 13.6* 15.6*  HGB 10.0* 10.0*  HCT 31.9* 31.4*  PLT 329 350   BMET  Recent Labs  11/14/16 0523 11/14/16 1845 11/15/16 0500  NA 135  --  136  K 3.1* 3.9 3.6  CL 96*  --  98*  CO2 25  --  27  GLUCOSE 115*  --  68  BUN 32*  --  8  CREATININE 5.76*  --  2.83*  CALCIUM 8.5*  --  8.0*   PT/INR  Recent Labs  11/14/16 0523 11/15/16 0500  LABPROT 21.8* 24.4*  INR 1.87 2.16   ABG No results for input(s): PHART, HCO3 in the last 72 hours.  Invalid input(s): PCO2, PO2  Studies/Results: Ir Gastrostomy Tube Mod Sed  Result Date: 11/13/2016 INDICATION: 60 year old female with dysphagia. EXAM: PERC PLACEMENT GASTROSTOMY MEDICATIONS: 2.0 g Ancef; Antibiotics were administered within 1 hour of the procedure. ANESTHESIA/SEDATION: Versed 2.0 mg IV; Fentanyl 100 mcg IV Moderate Sedation Time:  10 The patient was continuously monitored during the procedure by the interventional radiology nurse under my direct supervision. CONTRAST:  10 cc - administered into the gastric lumen. FLUOROSCOPY TIME:  Fluoroscopy Time: 0 minutes 42 seconds (1 mGy). COMPLICATIONS: None  PROCEDURE: Informed written consent was obtained from the patient and the patient's family after a thorough discussion of the procedural risks, benefits and alternatives. All questions were addressed. Maximal Sterile Barrier Technique was utilized including caps, mask, sterile gowns, sterile gloves, sterile drape, hand hygiene and skin antiseptic. A timeout was performed prior to the initiation of the procedure. The procedure, risks, benefits, and alternatives were explained to the patient. Questions regarding the procedure were encouraged and answered. The patient understands and consents to the procedure. The epigastrium was prepped with Betadine in a sterile fashion, and a sterile drape was applied covering the operative field. A sterile gown and sterile gloves were used for the procedure. A 5-French orogastric tube is placed under fluoroscopic guidance. Scout imaging of the abdomen confirms barium within the transverse colon. The stomach was distended with gas. Under fluoroscopic guidance, an 18 gauge needle was utilized to puncture the anterior wall of the body of the stomach. An Amplatz wire was advanced through the needle passing a T fastener into the lumen of the stomach. The T fastener was secured for gastropexy. A 9-French sheath was inserted. A snare was advanced through the 9-French sheath. A Britta Mccreedy was advanced through the orogastric tube. It was snared then pulled out the oral cavity, pulling the snare, as well. The leading edge of the gastrostomy was attached to the snare. It was then pulled down  the esophagus and out the percutaneous site. It was secured in place. Contrast was injected. No complication IMPRESSION: Status post fluoroscopic placed percutaneous gastrostomy tube, with 20 [place] pull-through. Signed, Dulcy Fanny. Earleen Newport, DO [facility] Radiology Electronically Signed   By: Corrie Mckusick D.O.   On: 11/13/2016 15:02   Ir Fluoro Guide Cv Line  Left  Result Date: 11/13/2016 INDICATION: 60 year old female with bacteremia.  Renal failure. She has been referred for tunneled central catheter placement. EXAM: IMAGE GUIDED TUNNELED CENTRAL VENOUS LINE MEDICATIONS: 2.0 g Ancef; The antibiotic was administered within an appropriate time interval prior to skin puncture. ANESTHESIA/SEDATION: None FLUOROSCOPY TIME:  Fluoroscopy Time: 0 minutes 18 seconds COMPLICATIONS: None PROCEDURE: After written informed consent was obtained, patient was placed in the supine position on angiographic table. Patency of the left internal jugular vein was confirmed with ultrasound with image documentation. Patient was prepped and draped in the usual sterile fashion including the right neck and right superior chest. Using ultrasound guidance, the skin and subcutaneous tissues overlying the right internal jugular vein were generously infiltrated with 1% lidocaine without epinephrine. Using ultrasound guidance, the right internal jugular vein was punctured with a micropuncture needle, and an 018 wire was advanced into the right heart confirming venous access. A small stab incision was made with an 11 blade scalpel. Peel-away sheath was placed over the wire, and then the wire was removed, marking the wire for estimation of internal catheter length. The chest wall was then generously infiltrated with 1% lidocaine for local anesthesia along the tissue tract. Small stab incision was made with 11 blade scalpel, and then the catheter was back tunneled to the puncture site at the right internal jugular vein. Catheter was pulled through the tract, with the catheter amputated at 22 cm. Catheter was advanced through the peel-away sheath, and the peel-away sheath was removed. Final image was stored. The catheter was anchored to the chest wall with 2 retention sutures, and Derma bond was used to seal the right internal jugular vein incision site and at the right chest wall. Patient tolerated the  procedure well and remained hemodynamically stable throughout. No complications were encountered and no significant blood loss was encountered. IMPRESSION: Status post left IJ central venous catheter with a cuffed double lumen 22 cm catheter. Catheter ready for use. Signed, Dulcy Fanny. Earleen Newport, DO [facility] Radiology Electronically Signed   By: Corrie Mckusick D.O.   On: 11/13/2016 15:01   Ir US Guide Vasc Access Left  Result Date: 11/13/2016 INDICATION: 60 year old female with bacteremia.  Renal failure. She has been referred for tunneled central catheter placement. EXAM: IMAGE GUIDED TUNNELED CENTRAL VENOUS LINE MEDICATIONS: 2.0 g Ancef; The antibiotic was administered within an appropriate time interval prior to skin puncture. ANESTHESIA/SEDATION: None FLUOROSCOPY TIME:  Fluoroscopy Time: 0 minutes 18 seconds COMPLICATIONS: None PROCEDURE: After written informed consent was obtained, patient was placed in the supine position on angiographic table. Patency of the left internal jugular vein was confirmed with ultrasound with image documentation. Patient was prepped and draped in the usual sterile fashion including the right neck and right superior chest. Using ultrasound guidance, the skin and subcutaneous tissues overlying the right internal jugular vein were generously infiltrated with 1% lidocaine without epinephrine. Using ultrasound guidance, the right internal jugular vein was punctured with a micropuncture needle, and an 018 wire was advanced into the right heart confirming venous access. A small stab incision was made with an 11 blade  scalpel. Peel-away sheath was placed over the wire, and then the wire was removed, marking the wire for estimation of internal catheter length. The chest wall was then generously infiltrated with 1% lidocaine for local anesthesia along the tissue tract. Small stab incision was made with 11 blade scalpel, and then the catheter  was back tunneled to the puncture site at the right internal jugular vein. Catheter was pulled through the tract, with the catheter amputated at 22 cm. Catheter was advanced through the peel-away sheath, and the peel-away sheath was removed. Final image was stored. The catheter was anchored to the chest wall with 2 retention sutures, and Derma bond was used to seal the right internal jugular vein incision site and at the right chest wall. Patient tolerated the procedure well and remained hemodynamically stable throughout. No complications were encountered and no significant blood loss was encountered. IMPRESSION: Status post left IJ central venous catheter with a cuffed double lumen 22 cm catheter. Catheter ready for use. Signed, Dulcy Fanny. Earleen Newport, DO [name] Radiology Electronically Signed   By: Corrie Mckusick D.O.   On: 11/13/2016 15:01    Anti-infectives: Anti-infectives    Start     Dose/Rate Route Frequency Ordered Stop   11/13/16 1410  ceFAZolin (ANCEF) 2-4 GM/100ML-% IVPB    Comments:  Shaaron Adler   : cabinet override      11/13/16 1410 11/13/16 1432   11/11/16 1800  ceFAZolin (ANCEF) IVPB 1 g/50 mL premix     1 g 100 mL/hr over 30 Minutes Intravenous Every 24 hours 11/11/16 1425     11/10/16 1400  rifampin (RIFADIN) 60 mg/mL oral suspension 300 mg     300 mg Per Tube Every 8 hours 11/10/16 1029     11/10/16 0815  vancomycin (VANCOCIN) 500 mg in sodium chloride 0.9 % 100 mL IVPB     500 mg 100 mL/hr over 60 Minutes Intravenous  Once 11/10/16 0802 11/10/16 1143   11/09/16 1830  rifampin (RIFADIN) 60 mg/mL oral suspension 300 mg  Status:  Discontinued     300 mg Oral Every 8 hours 11/09/16 1711 11/10/16 1029   11/09/16 1200  vancomycin (VANCOCIN) 500 mg in sodium chloride 0.9 % 100 mL IVPB  Status:  Discontinued     500 mg 100 mL/hr over 60 Minutes Intravenous Every T-Th-Sa (Hemodialysis) 11/08/16 0919 11/11/16 1424   11/08/16 1000   ceFEPIme (MAXIPIME) 1 g in dextrose 5 % 50 mL IVPB  Status:  Discontinued     1 g 100 mL/hr over 30 Minutes Intravenous Every 24 hours 11/08/16 0919 11/11/16 1424   11/08/16 0930  vancomycin (VANCOCIN) 1,250 mg in sodium chloride 0.9 % 250 mL IVPB     1,250 mg 166.7 mL/hr over 90 Minutes Intravenous  Once 11/08/16 0919 11/08/16 1415      Assessment/Plan: s/p Procedure(s): TRACHEOSTOMY (N/A) she is still on the vent so obviously I cannot change to uncuffed trach yet. she will continue with medical care until off the vent then consider trach adjustment for  speech.   LOS: 13 days    Melissa Montane 11/15/2016

## 2016-11-15 NOTE — Plan of Care (Signed)
Problem: Pain Managment: Goal: General experience of comfort will improve Outcome: Progressing Pt was assessed for pain throughout shift, repositioned her more comfort

## 2016-11-15 NOTE — Progress Notes (Signed)
INFECTIOUS DISEASE PROGRESS NOTE  ID: Deborah [name] is a 60 y.o. female with  Principal Problem:   Staphylococcus aureus bacteremia Active Problems:   Mitral regurgitation   S/P MVR (mitral valve replacement)   HCAP (healthcare-associated pneumonia)   Diabetes mellitus type 2 in nonobese ([name])   ESRD on dialysis ([place])   Anemia of chronic disease   Atrial flutter ([name])   Respiratory distress   Vocal cord paralysis   Bacteremia   End-stage renal disease on hemodialysis ([name])   Paroxysmal atrial fibrillation ([place])   Heart attack   Uncontrolled type 2 diabetes mellitus with complication, without long-term current use of insulin [facility])   Advance care planning   Vocal cord dysfunction   Palliative care by specialist   Goals of care, counseling/discussion  Subjective: No distress.   Abtx:  Anti-infectives    Start     Dose/Rate Route Frequency Ordered Stop   11/13/16 1410  ceFAZolin (ANCEF) 2-4 GM/100ML-% IVPB    Comments:  Shaaron Adler   : cabinet override      11/13/16 1410 11/13/16 1432   11/11/16 1800  ceFAZolin (ANCEF) IVPB 1 g/50 mL premix     1 g 100 mL/hr over 30 Minutes Intravenous Every 24 hours 11/11/16 1425     11/10/16 1400  rifampin (RIFADIN) 60 mg/mL oral suspension 300 mg     300 mg Per Tube Every 8 hours 11/10/16 1029     11/10/16 0815  vancomycin (VANCOCIN) 500 mg in sodium chloride 0.9 % 100 mL IVPB     500 mg 100 mL/hr over 60 Minutes Intravenous  Once 11/10/16 0802 11/10/16 1143   11/09/16 1830  rifampin (RIFADIN) 60 mg/mL oral suspension 300 mg  Status:  Discontinued     300 mg Oral Every 8 hours 11/09/16 1711 11/10/16 1029   11/09/16 1200  vancomycin (VANCOCIN) 500 mg in sodium chloride 0.9 % 100 mL IVPB  Status:  Discontinued     500 mg 100 mL/hr over 60 Minutes Intravenous Every T-Th-Sa (Hemodialysis) 11/08/16 0919 11/11/16 1424   11/08/16 1000  ceFEPIme (MAXIPIME) 1 g in dextrose 5 % 50 mL IVPB  Status:  Discontinued     1 g 100 mL/hr over 30  Minutes Intravenous Every 24 hours 11/08/16 0919 11/11/16 1424   11/08/16 0930  vancomycin (VANCOCIN) 1,250 mg in sodium chloride 0.9 % 250 mL IVPB     1,250 mg 166.7 mL/hr over 90 Minutes Intravenous  Once 11/08/16 0919 11/08/16 1415      Medications:  Scheduled: . amiodarone  200 mg Per Tube Daily  . atorvastatin  20 mg Per Tube q1800  .  ceFAZolin (ANCEF) IV  1 g Intravenous Q24H  . chlorhexidine gluconate (MEDLINE KIT)  15 mL Mouth Rinse BID  . clonazePAM  0.5 mg Per Tube BID  . clopidogrel  75 mg Per Tube Daily  . darbepoetin (ARANESP) injection - DIALYSIS  60 mcg Intravenous Q Sat-HD  . docusate  100 mg Per Tube BID  . ferric gluconate (FERRLECIT/NULECIT) IV  125 mg Intravenous Q T,Th,Sa-HD  . guaiFENesin-codeine  5 mL Per Tube Q6H  . insulin aspart  0-9 Units Subcutaneous Q4H  . mouth rinse  15 mL Mouth Rinse QID  . metoCLOPramide  10 mg Per Tube BID  . midodrine  10 mg Per Tube q morning - 10a  . multivitamin  1 tablet Oral QHS  . neomycin-bacitracin-polymyxin  1 application Topical Daily  . pantoprazole sodium  40  mg Per Tube Daily  . phosphorus  500 mg Oral TID  . rifampin  300 mg Per Tube Q8H  . sennosides  5 mL Per Tube BID  . sodium chloride flush  3 mL Intravenous Q12H  . warfarin  4 mg Per Tube ONCE-1800  . Warfarin - Pharmacist Dosing Inpatient   Does not apply q1800    Objective: Vital signs in last 24 hours: Temp:  [98.1 F (36.7 C)-99.1 F (37.3 C)] 98.6 F (37 C) (12/13 1210) Pulse Rate:  [71-108] 85 (12/13 1615) Resp:  [16-27] 18 (12/13 1615) BP: (92-163)/(32-85) 117/48 (12/13 1300) SpO2:  [92 %-100 %] 100 % (12/13 1615) FiO2 (%):  [40 %] 40 % (12/13 1615) Weight:  [55.9 kg (123 lb 3.8 oz)] 55.9 kg (123 lb 3.8 oz) (12/13 0447)   General appearance: alert and no distress Resp: rales anterior - bilateral Chest wall: no tenderness, HD R chest line clean Cardio: regular rate and rhythm GI: normal findings: bowel sounds normal and soft,  non-tender  Lab Results  Recent Labs  11/14/16 0523 11/14/16 1845 11/15/16 0500  WBC 13.6*  --  15.6*  HGB 10.0*  --  10.0*  HCT 31.9*  --  31.4*  NA 135  --  136  K 3.1* 3.9 3.6  CL 96*  --  98*  CO2 25  --  27  BUN 32*  --  8  CREATININE 5.76*  --  2.83*   Liver Panel  Recent Labs  11/14/16 0523 11/15/16 0500  ALBUMIN 1.9* 1.9*   Sedimentation Rate No results for input(s): ESRSEDRATE in the last 72 hours. C-Reactive Protein No results for input(s): CRP in the last 72 hours.  Microbiology: Recent Results (from the past 240 hour(s))  Culture, blood (Routine X 2) w Reflex to ID Panel     Status: Abnormal   Collection Time: 11/07/16  7:02 PM  Result Value Ref Range Status   Specimen Description BLOOD RIGHT ANTECUBITAL  Final   Special Requests IN PEDIATRIC BOTTLE 2CC  Final   Culture  Setup Time   Final    AEROBIC BOTTLE ONLY GRAM POSITIVE COCCI Organism ID to follow CRITICAL RESULT CALLED TO, READ BACK BY AND VERIFIED WITH: Narda Bonds, PHARMD @2343  11/08/16 MKELLY,MLT    Culture STAPHYLOCOCCUS AUREUS (A)  Final   Report Status 11/11/2016 FINAL  Final   Organism ID, Bacteria STAPHYLOCOCCUS AUREUS  Final      Susceptibility   Staphylococcus aureus - MIC*    CIPROFLOXACIN 2 INTERMEDIATE Intermediate     ERYTHROMYCIN >=8 RESISTANT Resistant     GENTAMICIN <=0.5 SENSITIVE Sensitive     OXACILLIN 0.5 SENSITIVE Sensitive     TETRACYCLINE <=1 SENSITIVE Sensitive     VANCOMYCIN <=0.5 SENSITIVE Sensitive     TRIMETH/SULFA <=10 SENSITIVE Sensitive     CLINDAMYCIN <=0.25 RESISTANT Resistant     RIFAMPIN <=0.5 SENSITIVE Sensitive     Inducible Clindamycin POSITIVE Resistant     * STAPHYLOCOCCUS AUREUS  Blood Culture ID Panel (Reflexed)     Status: Abnormal   Collection Time: 11/07/16  7:02 PM  Result Value Ref Range Status   Enterococcus species NOT DETECTED NOT DETECTED Final   Listeria monocytogenes NOT DETECTED NOT DETECTED Final   Staphylococcus species  DETECTED (A) NOT DETECTED Final    Comment: CRITICAL RESULT CALLED TO, READ BACK BY AND VERIFIED WITH: JAMES LEDFORD, PHARMD @2343  11/08/16 MKELLY,MLT    Staphylococcus aureus DETECTED (A) NOT DETECTED Final  Comment: CRITICAL RESULT CALLED TO, READ BACK BY AND VERIFIED WITH: JAMES LEDFORD, PHARMD @2343  11/08/16 MKELLY,MLT    Methicillin resistance NOT DETECTED NOT DETECTED Final   Streptococcus species NOT DETECTED NOT DETECTED Final   Streptococcus agalactiae NOT DETECTED NOT DETECTED Final   Streptococcus pneumoniae NOT DETECTED NOT DETECTED Final   Streptococcus pyogenes NOT DETECTED NOT DETECTED Final   Acinetobacter baumannii NOT DETECTED NOT DETECTED Final   Enterobacteriaceae species NOT DETECTED NOT DETECTED Final   Enterobacter cloacae complex NOT DETECTED NOT DETECTED Final   Escherichia coli NOT DETECTED NOT DETECTED Final   Klebsiella oxytoca NOT DETECTED NOT DETECTED Final   Klebsiella pneumoniae NOT DETECTED NOT DETECTED Final   Proteus species NOT DETECTED NOT DETECTED Final   Serratia marcescens NOT DETECTED NOT DETECTED Final   Haemophilus influenzae NOT DETECTED NOT DETECTED Final   Neisseria meningitidis NOT DETECTED NOT DETECTED Final   Pseudomonas aeruginosa NOT DETECTED NOT DETECTED Final   Candida albicans NOT DETECTED NOT DETECTED Final   Candida glabrata NOT DETECTED NOT DETECTED Final   Candida krusei NOT DETECTED NOT DETECTED Final   Candida parapsilosis NOT DETECTED NOT DETECTED Final   Candida tropicalis NOT DETECTED NOT DETECTED Final  Culture, blood (Routine X 2) w Reflex to ID Panel     Status: Abnormal   Collection Time: 11/07/16  7:08 PM  Result Value Ref Range Status   Specimen Description BLOOD RIGHT HAND  Final   Special Requests BOTTLES DRAWN AEROBIC ONLY 5CC  Final   Culture  Setup Time   Final    GRAM POSITIVE COCCI IN CLUSTERS AEROBIC BOTTLE ONLY CRITICAL VALUE NOTED.  VALUE IS CONSISTENT WITH PREVIOUSLY REPORTED AND CALLED VALUE.     Culture (A)  Final    STAPHYLOCOCCUS AUREUS SUSCEPTIBILITIES PERFORMED ON PREVIOUS CULTURE WITHIN THE LAST 5 DAYS.    Report Status 11/11/2016 FINAL  Final  Culture, blood (Routine X 2) w Reflex to ID Panel     Status: None   Collection Time: 11/09/16  6:05 PM  Result Value Ref Range Status   Specimen Description BLOOD RIGHT HAND  Final   Special Requests BOTTLES DRAWN AEROBIC ONLY [place]  Final   Culture NO GROWTH 5 DAYS  Final   Report Status 11/14/2016 FINAL  Final  Cath Tip Culture     Status: None   Collection Time: 11/11/16  2:58 PM  Result Value Ref Range Status   Specimen Description CATH TIP  Final   Special Requests NONE  Final   Culture NO GROWTH 3 DAYS  Final   Report Status 11/15/2016 FINAL  Final    Studies/Results: No results found.   Assessment/Plan: MSSAbacteremia HCAP Steroid induced leukocytosis Prev CV surgery (MV bioprosthesis) Vocal cord paralysis, trach redo 12-7 PEG 12-11  Improving, out of unit Slight increase in WBC today Continuerifampin (prev LFs normal) HD line out on 12-7, new line in R chest.  Await TEE Repeat BCx 12-7 ngtd  Total days of antibiotics: 8 (ancef/rifampin)         Bobby Rumpf Infectious Diseases (pager) [phone] [email or web address] 11/15/2016, 4:35 PM  LOS: 13 days

## 2016-11-15 NOTE — Progress Notes (Addendum)
ANTICOAGULATION CONSULT NOTE - Follow Up Consult  Pharmacy Consult for Heparin  Indication: atrial fibrillation and bioprosthetic MVR  No Known Allergies  Patient Measurements: Height: 5\' 2"  (157.5 cm) Weight: 123 lb 3.8 oz (55.9 kg) IBW/kg (Calculated) : 50.1  Vital Signs: Temp: 99.1 F (37.3 C) (12/13 0832) Temp Source: Axillary (12/13 0832) BP: 107/69 (12/13 0900) Pulse Rate: 97 (12/13 0900)  Labs:  Recent Labs  11/13/16 0300 11/14/16 0523 11/14/16 1149 11/15/16 0450 11/15/16 0500  HGB 10.8* 10.0*  --   --  10.0*  HCT 34.0* 31.9*  --   --  31.4*  PLT 297 329  --   --  350  LABPROT 16.6* 21.8*  --   --  24.4*  INR 1.34 1.87  --   --  2.16  HEPARINUNFRC  --  0.36 0.57 0.59  --   CREATININE 4.44* 5.76*  --   --  2.83*    Estimated Creatinine Clearance: 16.9 mL/min (by C-G formula based on SCr of 2.83 mg/dL (H)).   Assessment: 61 yo M with h/o atrial fibrillation, PTA warfarin on hold, to continue on heparin gtt.  Heparin level is therapeutic at 0.59 on 950 units/hr.  CBC stable. No s/sx of bleeding.   Goal of Therapy:  Heparin level 0.3-0.7 units/ml Monitor platelets by anticoagulation protocol: Yes   Plan:  Continue heparin at 950 units/hr. Daily heparin level and CBC Follow-up restart warfarin now with G-tube in place.   Gwenlyn Perking, PharmD PGY1 Pharmacy Resident Pager: [phone] 11/15/2016 9:52 AM   ADDENDUM:  Billy Fischer wafarin now that G tube placed. PTA Coumadin dose was 2mg  daily exc for 4mg  on MWF. Has not had Coumadin in over a week but INR trending up some to 2.16. No known liver dysfunction. Started on rifampin which may decrease the effectiveness of Coumadin.  Goal: INR 2.0-3.0  Plan: Give Coumadin 4mg  PO x 1 tonight Continue heparin gtt but may be able to stop in the morning if INR remains therapeutic Monitor daily HL / INR, CBC, s/s of bleed

## 2016-11-15 NOTE — Plan of Care (Signed)
Problem: Skin Integrity: Goal: Risk for impaired skin integrity will decrease Outcome: Progressing Foam placed on stage 2 on bottom. Patient turned, with focus on off loading.   Problem: Tissue Perfusion: Goal: Risk factors for ineffective tissue perfusion will decrease Outcome: Completed/Met Date Met: 11/15/16 SCD's being utilized for this patient.  Problem: Activity: Goal: Risk for activity intolerance will decrease Outcome: Progressing Patient worked with PT today and was able to sit at side of bed and stand with 2 person assist.   Problem: Nutrition: Goal: Adequate nutrition will be maintained Outcome: Progressing Order placed to restart patient's tube feedings. Tube feeds started at 4m/hr and will be increased every 4 hours by 10.   Problem: Bowel/Gastric: Goal: Will not experience complications related to bowel motility Outcome: Progressing Patient having frequent loose stools. They are reddish brown in color. Night shift RN notified and will try to test patient for C diff.

## 2016-11-15 NOTE — Progress Notes (Signed)
[facility] Progress Note   Subjective:  Awake, alert, NAD. For HD today.   Objective Vitals:   11/15/16 0810 11/15/16 0832 11/15/16 0900 11/15/16 1116  BP:  (!) 93/59 107/69   Pulse: 89  97 81  Resp: (!) 23  (!) 21 19  Temp:  99.1 F (37.3 C)    TempSrc:  Axillary    SpO2: 100%  100% 100%  Weight:      Height:       Physical Exam General: Alert, smiles to verbal stimuli.  Heart: S1.S2. RRR  Lungs: Trach to vent, BBS, symmetrical chest excursions. CTAB A/P Abdomen: Soft, non-tender, PEG/NG in place.  Extremities: No LE Edema.  Dialysis Access: LUA AVF + bruit  Additional Objective Labs: Basic Metabolic Panel:  Recent Labs Lab 11/13/16 0300 11/13/16 1043 11/14/16 0523 11/14/16 1845 11/15/16 0500  NA 136  --  135  --  136  K 3.8  --  3.1* 3.9 3.6  CL 98*  --  96*  --  98*  CO2 23  --  25  --  27  GLUCOSE 46* 494* 115*  --  68  BUN 26*  --  32*  --  8  CREATININE 4.44*  --  5.76*  --  2.83*  CALCIUM 8.7*  --  8.5*  --  8.0*  PHOS 1.7*  --  2.1*  --  1.2*   Liver Function Tests: CBC:  Recent Labs Lab 11/11/16 0220 11/12/16 0235 11/13/16 0300 11/14/16 0523 11/15/16 0500  WBC 17.4* 18.7* 15.6* 13.6* 15.6*  HGB 10.1* 10.6* 10.8* 10.0* 10.0*  HCT 32.4* 34.0* 34.0* 31.9* 31.4*  MCV 88.3 87.2 86.1 86.2 85.8  PLT 253 251 297 329 350   Blood Culture    Component Value Date/Time   SDES CATH TIP 11/11/2016 1458   SPECREQUEST NONE 11/11/2016 1458   CULT NO GROWTH 3 DAYS 11/11/2016 1458   REPTSTATUS 11/15/2016 FINAL 11/11/2016 1458    Cardiac Enzymes: No results for input(s): CKTOTAL, CKMB, CKMBINDEX, TROPONINI in the last 168 hours. CBG:  Recent Labs Lab 11/14/16 1549 11/14/16 1957 11/15/16 0015 11/15/16 0453 11/15/16 0831  GLUCAP 80 117* 162* 73 87    Medications: . sodium chloride 10 mL/hr at 11/14/16 2000  . feeding supplement (VITAL AF 1.2 CAL) Stopped (11/11/16 1829)  . heparin 950 Units/hr (11/14/16 2144)   .  amiodarone  200 mg Per Tube Daily  . atorvastatin  20 mg Per Tube q1800  .  ceFAZolin (ANCEF) IV  1 g Intravenous Q24H  . chlorhexidine gluconate (MEDLINE KIT)  15 mL Mouth Rinse BID  . clonazePAM  0.5 mg Per Tube BID  . clopidogrel  75 mg Per Tube Daily  . darbepoetin (ARANESP) injection - DIALYSIS  60 mcg Intravenous Q Sat-HD  . docusate  100 mg Per Tube BID  . ferric gluconate (FERRLECIT/NULECIT) IV  125 mg Intravenous Q T,Th,Sa-HD  . guaiFENesin-codeine  5 mL Per Tube Q6H  . insulin aspart  0-15 Units Subcutaneous Q4H  . mouth rinse  15 mL Mouth Rinse QID  . metoCLOPramide  10 mg Per Tube BID  . midodrine  10 mg Per Tube q morning - 10a  . multivitamin  1 tablet Oral QHS  . neomycin-bacitracin-polymyxin  1 application Topical Daily  . pantoprazole sodium  40 mg Per Tube Daily  . rifampin  300 mg Per Tube Q8H  . sennosides  5 mL Per Tube BID  . 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 Per PCCM.  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 Nutrition: Albumin 1.9. Tube feeding on hold. Peg in place.  8. Hypophosphatemia 9. Hypokalemia   Plan - HD tomorrow, min UF, replace phos/ K per NG/PEG   Kelly Splinter MD [place] pager [phone]   11/15/2016, 11:40 AM

## 2016-11-15 NOTE — Progress Notes (Signed)
Daily Progress Note   Patient Name: Deborah [name]       Date: 11/15/2016 DOB: 1956/04/12  Age: 60 y.o. MRN#: [id number] Attending Physician: Cherene Altes, MD Primary Care Physician: Rica Mast, MD Admit Date: 11/02/2016  Reason for Consultation/Follow-up: Establishing goals of care  Subjective: Attempted to call patient's daughter, [name], this morning to continue to coordinate family meeting. Left message requesting return call. Met with patient. She was somnolent and drifted to sleep during our conversation. I gave her a notepad and pens to help her communicate. Also printed out a timeline of her care so that she could see what she has been through thus far. Informed her of meeting tomorrow morning with her son, [name]. She had no complaints or questions.  Review of Systems  All other systems reviewed and are negative.   Length of Stay: 13  Current Medications: Scheduled Meds:  . amiodarone  200 mg Per Tube Daily  . atorvastatin  20 mg Per Tube q1800  .  ceFAZolin (ANCEF) IV  1 g Intravenous Q24H  . chlorhexidine gluconate (MEDLINE KIT)  15 mL Mouth Rinse BID  . clonazePAM  0.5 mg Per Tube BID  . clopidogrel  75 mg Per Tube Daily  . darbepoetin (ARANESP) injection - DIALYSIS  60 mcg Intravenous Q Sat-HD  . docusate  100 mg Per Tube BID  . ferric gluconate (FERRLECIT/NULECIT) IV  125 mg Intravenous Q T,Th,Sa-HD  . guaiFENesin-codeine  5 mL Per Tube Q6H  . insulin aspart  0-15 Units Subcutaneous Q4H  . mouth rinse  15 mL Mouth Rinse QID  . metoCLOPramide  10 mg Per Tube BID  . midodrine  10 mg Per Tube q morning - 10a  . multivitamin  1 tablet Oral QHS  . neomycin-bacitracin-polymyxin  1 application Topical Daily  . pantoprazole sodium  40 mg Per Tube Daily  .  rifampin  300 mg Per Tube Q8H  . sennosides  5 mL Per Tube BID  . sodium chloride flush  3 mL Intravenous Q12H    Continuous Infusions: . sodium chloride 10 mL/hr at 11/14/16 2000  . feeding supplement (VITAL AF 1.2 CAL) Stopped (11/11/16 1829)  . heparin 950 Units/hr (11/14/16 2144)    PRN Meds: fentaNYL (SUBLIMAZE) injection, fluticasone, levalbuterol, ondansetron (ZOFRAN) IV, prochlorperazine  Physical Exam  Constitutional: She appears well-developed and well-nourished. No distress.  Cardiovascular: Normal rate and regular rhythm.   Pulmonary/Chest: She has wheezes.  trach, full vent support  Abdominal: Soft. Bowel sounds are normal.  PEG in place with continuous TF running  Musculoskeletal: She exhibits edema.  Neurological:  drowsy  Skin: Skin is warm and dry.  Nursing note and vitals reviewed.           Vital Signs: BP 107/69   Pulse 97   Temp 99.1 F (37.3 C) (Axillary)   Resp (!) 21   Ht 5' 2"  (1.575 m)   Wt 55.9 kg (123 lb 3.8 oz)   SpO2 100%   BMI 22.54 kg/m  SpO2: SpO2: 100 % O2 Device: O2 Device: Ventilator O2 Flow Rate: O2 Flow Rate (L/min): 3 L/min  Intake/output summary:  Intake/Output Summary (Last 24 hours) at 11/15/16 1018 Last data filed at 11/15/16 3818  Gross per 24 hour  Intake           590.17 ml  Output                0 ml  Net           590.17 ml   LBM: Last BM Date: 11/13/16 Baseline Weight: Weight: 55.3 kg (122 lb) Most recent weight: Weight: 55.9 kg (123 lb 3.8 oz)       Palliative Assessment/Data: PPS: 10%    Flowsheet Rows   Flowsheet Row Most Recent Value  Intake Tab  Referral Department  Hospitalist  Unit at Time of Referral  ICU  Palliative Care Primary Diagnosis  Cardiac  Date Notified  11/14/16  Palliative Care Type  New Palliative care  Reason for referral  Clarify Goals of Care  Date of Admission  11/02/16  # of days IP prior to Palliative referral  12  Clinical Assessment  Psychosocial & Spiritual Assessment    Palliative Care Outcomes      Patient Active Problem List   Diagnosis Date Noted  . Advance care planning   . Vocal cord dysfunction   . Palliative care by specialist   . Bacteremia   . End-stage renal disease on hemodialysis ([place])   . Paroxysmal atrial fibrillation ([name])   . Heart attack   . Uncontrolled type 2 diabetes mellitus with complication, without long-term current use of insulin ([place])   . Staphylococcus aureus bacteremia 11/12/2016  . Vocal cord paralysis   . Respiratory distress 11/02/2016  . Chronic anticoagulation   . Acute lower UTI   . Labile blood glucose   . Leukocytosis   . Subtherapeutic international normalized ratio (INR)   . Supratherapeutic INR   . Cough   . Oropharyngeal dysphagia   . Uncontrolled type 2 diabetes mellitus with complication, with long-term current use of insulin ([place])   . Physical debility 10/11/2016  . Metabolic encephalopathy 29/93/7169  . Debility   . ESRD on dialysis ([place])   . Type 2 diabetes mellitus with complication, with long-term current use of insulin ([place])   . Anemia of chronic disease   . Coronary artery disease involving native coronary artery of native heart without angina pectoris   . Acute on chronic respiratory failure with hypoxia ([place])   . Atrial flutter ([place])   . Persistent cough   . Dysphonia   . Sleep disturbance   . Diabetes mellitus type 2 in nonobese ([name])   . Stage 3 chronic kidney disease   . NSTEMI (non-ST elevated myocardial infarction) ([place])   .  Tachypnea   . Hyponatremia   . Lymphocytosis   . Acute blood loss anemia   . Pressure injury of skin 09/18/2016  . Tracheostomy status ([place])   . Delirium   . Encounter for attention to tracheostomy ([place])   . History of MVR with cardiopulmonary bypass   . Systolic congestive heart failure ([place])   . HCAP (healthcare-associated pneumonia)   . PAF (paroxysmal atrial fibrillation) ([place])   . Acute systolic congestive heart failure ([place])   . AKI (acute  kidney injury) ([place])   . Abnormal LFTs   . S/P MVR (mitral valve replacement)   . Mitral regurgitation 08/23/2016  . Acute on chronic diastolic heart failure ([place]) 08/18/2016  . Acute pulmonary edema ([name])   . Respiratory failure ([place]) 08/17/2016  . Cardiogenic shock ([place]) 08/17/2016  . Acute hypoxemic respiratory failure ([place])   . STEMI (ST elevation myocardial infarction) ([place]) 08/15/2016  . Noncompliance 01/25/2015  . Bereavement 07/09/2014  . Low back pain 02/17/2014  . Cervical cancer screening 11/20/2013  . Routine general medical examination at a health care facility 10/14/2013  . Chronic kidney disease, stage 3 06/14/2012  . Hypertension 03/08/2012  . Diabetes mellitus type 2, controlled 03/08/2012  . Hyperlipidemia 03/08/2012    Palliative Care Assessment & Plan   Patient Profile: 60 y.o. female  with past medical history of DM, HTN,  ESRD (now on perm HD),  admitted on 11/02/2016 with shortness of breath. She recently had a prolonged hospitalization spanning September through November d/t STEMI with papillary muscle rupture. During that stay she required trach, but was able to be decannulated and returned home after going to CIR. She was at home for less than a day when she returned to ED with complaints of increasing SOB. 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 She now also has HCAP with MSSA bacteremia.   Assessment/Recommendations/Plan   Continue current level of care  Family meeting planned for 12/14 @11   Palliative medicine will continue to follow  Goals of Care and Additional Recommendations:  Limitations on Scope of Treatment: Full Scope Treatment  Code Status:  Full code  Prognosis:   Unable to determine  Discharge Planning:  To Be Determined  Care plan was discussed with patient.  Thank you for allowing the Palliative Medicine Team to assist in the care of this patient.   Time In: 1000  Time Out: 1025 Total Time 25 minutes Prolonged Time Billed No      Greater than 50%  of this time was spent counseling and coordinating care related to the above assessment and plan.  Mariana Kaufman, AGNP-C Palliative Medicine   Please contact Palliative Medicine Team phone at [phone] for questions and concerns.

## 2016-11-15 NOTE — Progress Notes (Addendum)
Nutrition Follow Up  DOCUMENTATION CODES:   Non-severe (moderate) malnutrition in context of chronic illness  INTERVENTION:    Restart Vital AF 1.2 at 20 ml/hr and increase by 10 ml every 8 hours to goal rate of 50 ml/hr to provide 1440 kcals, 90 gm protein, 973 ml free water daily.  NUTRITION DIAGNOSIS:   Malnutrition related to chronic illness as evidenced by mild depletion of body fat, mild depletion of muscle mass, percent weight loss (9% weight loss within one month), ongoing  GOAL:   Patient will meet greater than or equal to 90% of their needs, currently unmet  MONITOR:   Vent status, Labs, TF tolerance, I & O's  ASSESSMENT:   60 year old female with recent prolonged hospitalization secondary to papillary muscle rupture and subsequent HCAP. She was intubated requiring trach. Decannulated in the end of October. She then went to [facility] and was discharged 11/29. No presenting to [facility] ED 11/30 with compalints of SOB x1-2 weeks. She was seen by ENT they found cords were not opening from either scarring, inflammation, or nerve issue. They were hoping she would improve in the coming days, however, she did not and was taken for tracheostomy 12/7.   Patient is currently on ventilator support >> trach MV: 7.4 L/min Temp (24hrs), Avg:98.6 F (37 C), Min:98.1 F (36.7 C), Max:99.1 F (37.3 C)  Pt s/p PEG tube placed 12/11 >> ok to use 12/12. NGT in place >> can D/C if ok with CCM and pt tolerating TF. ESRD >> continues on HD. Speech Path following. CBG's [id number].   Diet Order:  Diet NPO time specified Except for: Sips with Meds  Skin:  Wound (see comment) (stage II to sacrum)  Last BM:  12/13  Height:   Ht Readings from Last 1 Encounters:  11/10/16 5\' 2"  (1.575 m)    Weight:   Wt Readings from Last 1 Encounters:  11/15/16 123 lb 3.8 oz (55.9 kg)    Ideal Body Weight:  50 kg  BMI:  Body mass index is 22.54 kg/m.  Estimated Nutritional Needs:   Kcal:   1300-1500  Protein:  80-90 gm  Fluid:  >/= 1.5 L  EDUCATION NEEDS:   No education needs identified at this time  Arthur Holms, RD, LDN Pager #: [phone] After-Hours Pager #: [phone]

## 2016-11-15 NOTE — Progress Notes (Signed)
[place] TEAM 1 - Stepdown/ICU TEAM  Deborah [name]  VIF:[id number] DOB: 1956/01/10 DOA: 11/02/2016 PCP: Rica Mast, MD    Brief Narrative:  85-yo F Hx Prolonged intubation requiring tracheostomy placement and subsequent decannulation, STEMI with Papillary Muscle Rupture requiring surgical repair in September, Atrial Flutter S/P cardioversion on Coumadin, HTN, DM2, HLD, and ESRD on HD (T/TH/Sat) who presented with labored breathing during her hemodialysis session.   Significant Events: 09/12 Admit to [facility] 09/15 Tx to [facility] 09/20 MVR with bioprosthetic valve 09/28 Permcath for HD 09/29 Extubated 09/30 Reintubated 10/05 Trach by Hyman Bible 10/09 Off vent 10/25 Decannulated 11/8 to CIR 11/29 DC from CIR 11/30 admit for resp distress 127 redo trach Tracheostomy 6.4 cuffed [name] - ENT)  12/09 L IJ Tunneled HD catheter removed  12/11 PEG tube in IR - L IJ double lumen cath in IR   Subjective: Patient is resting comfortably in bed.  She denies chest pain fevers chills or abdominal pain.  When I ask her if she is short of breath she shakes her head to indicate that she is not.  Assessment & Plan:  Vocal cord paralysis S/P repeat tracheostomy by ENT 12/7 tracheostomy care and vent management per ENT/PCCM  HCAP ID following &guiding antibiotics  MSSA Bacteremia  Presumed HD cath related - HD cath out per ID - to have TEE - ID following   ESRD on HD (T/TH/Sat) HD continues via AVG - Nephrology following   Hypokalemia Corrected   Hypomagnesemia Corrected  Paroxysmal Atrial Fibrillation Rate controlled - on amiodarone plus heparin presently  Bioprosthetic heart valve ID requesting TEE  Acute on Chronic Hypotension Cont midodrine - blood pressure stable  Inferior STEMI S/P DES x 05 August 2016 continue Plavix  DM2 9/12 A1c 13.3  Anxiety Appears controlled at the present time  DVT prophylaxis: IV heparin Code Status: FULL CODE Family Communication:  no family present at time of exam  Disposition Plan: SDU  Consultants:  PCCM Nephrology  ID ENT  Antimicrobials:  Cefepime 12/6 - 12/9 Vancomycin 12/6 - 12/9 Rifapmin 12/7 > Cefazolin 12/9 >  Objective: Blood pressure (!) 117/48, pulse 87, temperature 98.6 F (37 C), temperature source Oral, resp. rate 18, height 5' 2"  (1.575 m), weight 55.9 kg (123 lb 3.8 oz), SpO2 100 %.  Intake/Output Summary (Last 24 hours) at 11/15/16 1436 Last data filed at 11/15/16 1300  Gross per 24 hour  Intake           749.17 ml  Output                0 ml  Net           749.17 ml   Filed Weights   11/14/16 1230 11/14/16 1713 11/15/16 0447  Weight: 55.9 kg (123 lb 3.8 oz) 55.9 kg (123 lb 3.8 oz) 55.9 kg (123 lb 3.8 oz)    Examination: General: No acute respiratory distress on vent presently  Lungs: Clear to auscultation bilaterally - no wheeze  Cardiovascular: Regular rate and rhythm without murmur  Abdomen: Nontender, nondistended, soft, PEG insertion near midline clean and dry  Extremities: No significant cyanosis, clubbing, or edema   CBC:  Recent Labs Lab 11/11/16 0220 11/12/16 0235 11/13/16 0300 11/14/16 0523 11/15/16 0500  WBC 17.4* 18.7* 15.6* 13.6* 15.6*  HGB 10.1* 10.6* 10.8* 10.0* 10.0*  HCT 32.4* 34.0* 34.0* 31.9* 31.4*  MCV 88.3 87.2 86.1 86.2 85.8  PLT 253 251 297 329 147   Basic Metabolic Panel:  Recent  Labs Lab 11/11/16 0220 11/12/16 0235 11/13/16 0300 11/13/16 1043 11/14/16 0523 11/14/16 1845 11/15/16 0500  NA 136 135 136  --  135  --  136  K 3.8 3.1* 3.8  --  3.1* 3.9 3.6  CL 102 95* 98*  --  96*  --  98*  CO2 21* 30 23  --  25  --  27  GLUCOSE 198* 148* 46* 494* 115*  --  68  BUN 46* 19 26*  --  32*  --  8  CREATININE 4.35* 3.03* 4.44*  --  5.76*  --  2.83*  CALCIUM 8.4* 8.6* 8.7*  --  8.5*  --  8.0*  MG 1.7 1.7 1.7  --  1.7 2.0 2.1  PHOS 3.3 2.0* 1.7*  --  2.1*  --  1.2*   GFR: Estimated Creatinine Clearance: 16.9 mL/min (by C-G formula based on  SCr of 2.83 mg/dL (H)).  Liver Function Tests:  Recent Labs Lab 11/11/16 0220 11/12/16 0235 11/13/16 0300 11/14/16 0523 11/15/16 0500  ALBUMIN 2.0* 2.1* 2.1* 1.9* 1.9*    Coagulation Profile:  Recent Labs Lab 11/11/16 0220 11/12/16 0235 11/13/16 0300 11/14/16 0523 11/15/16 0500  INR 1.48 1.45 1.34 1.87 2.16    HbA1C: Hgb A1c MFr Bld  Date/Time Value Ref Range Status  08/15/2016 01:38 PM 13.3 (H) 4.8 - 5.6 % Final    Comment:    (NOTE)         Pre-diabetes: 5.7 - 6.4         Diabetes: >6.4         Glycemic control for adults with diabetes: <7.0   07/09/2014 01:54 PM 7.6 (H) 4.6 - 6.5 % Final    Comment:    Glycemic Control Guidelines for People with Diabetes:Non Diabetic:  <6%Goal of Therapy: <7%Additional Action Suggested:  >8%     CBG:  Recent Labs Lab 11/14/16 1957 11/15/16 0015 11/15/16 0453 11/15/16 0831 11/15/16 1214  GLUCAP 117* 162* 73 87 94    Recent Results (from the past 240 hour(s))  Culture, blood (Routine X 2) w Reflex to ID Panel     Status: Abnormal   Collection Time: 11/07/16  7:02 PM  Result Value Ref Range Status   Specimen Description BLOOD RIGHT ANTECUBITAL  Final   Special Requests IN PEDIATRIC BOTTLE 2CC  Final   Culture  Setup Time   Final    AEROBIC BOTTLE ONLY GRAM POSITIVE COCCI Organism ID to follow CRITICAL RESULT CALLED TO, READ BACK BY AND VERIFIED WITH: Narda Bonds, PHARMD @2343  11/08/16 MKELLY,MLT    Culture STAPHYLOCOCCUS AUREUS (A)  Final   Report Status 11/11/2016 FINAL  Final   Organism ID, Bacteria STAPHYLOCOCCUS AUREUS  Final      Susceptibility   Staphylococcus aureus - MIC*    CIPROFLOXACIN 2 INTERMEDIATE Intermediate     ERYTHROMYCIN >=8 RESISTANT Resistant     GENTAMICIN <=0.5 SENSITIVE Sensitive     OXACILLIN 0.5 SENSITIVE Sensitive     TETRACYCLINE <=1 SENSITIVE Sensitive     VANCOMYCIN <=0.5 SENSITIVE Sensitive     TRIMETH/SULFA <=10 SENSITIVE Sensitive     CLINDAMYCIN <=0.25 RESISTANT  Resistant     RIFAMPIN <=0.5 SENSITIVE Sensitive     Inducible Clindamycin POSITIVE Resistant     * STAPHYLOCOCCUS AUREUS  Blood Culture ID Panel (Reflexed)     Status: Abnormal   Collection Time: 11/07/16  7:02 PM  Result Value Ref Range Status   Enterococcus species NOT DETECTED NOT DETECTED Final  Listeria monocytogenes NOT DETECTED NOT DETECTED Final   Staphylococcus species DETECTED (A) NOT DETECTED Final    Comment: CRITICAL RESULT CALLED TO, READ BACK BY AND VERIFIED WITH: JAMES LEDFORD, PHARMD @2343  11/08/16 MKELLY,MLT    Staphylococcus aureus DETECTED (A) NOT DETECTED Final    Comment: CRITICAL RESULT CALLED TO, READ BACK BY AND VERIFIED WITH: JAMES LEDFORD, PHARMD @2343  11/08/16 MKELLY,MLT    Methicillin resistance NOT DETECTED NOT DETECTED Final   Streptococcus species NOT DETECTED NOT DETECTED Final   Streptococcus agalactiae NOT DETECTED NOT DETECTED Final   Streptococcus pneumoniae NOT DETECTED NOT DETECTED Final   Streptococcus pyogenes NOT DETECTED NOT DETECTED Final   Acinetobacter baumannii NOT DETECTED NOT DETECTED Final   Enterobacteriaceae species NOT DETECTED NOT DETECTED Final   Enterobacter cloacae complex NOT DETECTED NOT DETECTED Final   Escherichia coli NOT DETECTED NOT DETECTED Final   Klebsiella oxytoca NOT DETECTED NOT DETECTED Final   Klebsiella pneumoniae NOT DETECTED NOT DETECTED Final   Proteus species NOT DETECTED NOT DETECTED Final   Serratia marcescens NOT DETECTED NOT DETECTED Final   Haemophilus influenzae NOT DETECTED NOT DETECTED Final   Neisseria meningitidis NOT DETECTED NOT DETECTED Final   Pseudomonas aeruginosa NOT DETECTED NOT DETECTED Final   Candida albicans NOT DETECTED NOT DETECTED Final   Candida glabrata NOT DETECTED NOT DETECTED Final   Candida krusei NOT DETECTED NOT DETECTED Final   Candida parapsilosis NOT DETECTED NOT DETECTED Final   Candida tropicalis NOT DETECTED NOT DETECTED Final  Culture, blood (Routine X 2) w  Reflex to ID Panel     Status: Abnormal   Collection Time: 11/07/16  7:08 PM  Result Value Ref Range Status   Specimen Description BLOOD RIGHT HAND  Final   Special Requests BOTTLES DRAWN AEROBIC ONLY 5CC  Final   Culture  Setup Time   Final    GRAM POSITIVE COCCI IN CLUSTERS AEROBIC BOTTLE ONLY CRITICAL VALUE NOTED.  VALUE IS CONSISTENT WITH PREVIOUSLY REPORTED AND CALLED VALUE.    Culture (A)  Final    STAPHYLOCOCCUS AUREUS SUSCEPTIBILITIES PERFORMED ON PREVIOUS CULTURE WITHIN THE LAST 5 DAYS.    Report Status 11/11/2016 FINAL  Final  Culture, blood (Routine X 2) w Reflex to ID Panel     Status: None   Collection Time: 11/09/16  6:05 PM  Result Value Ref Range Status   Specimen Description BLOOD RIGHT HAND  Final   Special Requests BOTTLES DRAWN AEROBIC ONLY [place]  Final   Culture NO GROWTH 5 DAYS  Final   Report Status 11/14/2016 FINAL  Final  Cath Tip Culture     Status: None   Collection Time: 11/11/16  2:58 PM  Result Value Ref Range Status   Specimen Description CATH TIP  Final   Special Requests NONE  Final   Culture NO GROWTH 3 DAYS  Final   Report Status 11/15/2016 FINAL  Final     Scheduled Meds: . amiodarone  200 mg Per Tube Daily  . atorvastatin  20 mg Per Tube q1800  .  ceFAZolin (ANCEF) IV  1 g Intravenous Q24H  . chlorhexidine gluconate (MEDLINE KIT)  15 mL Mouth Rinse BID  . clonazePAM  0.5 mg Per Tube BID  . clopidogrel  75 mg Per Tube Daily  . darbepoetin (ARANESP) injection - DIALYSIS  60 mcg Intravenous Q Sat-HD  . docusate  100 mg Per Tube BID  . ferric gluconate (FERRLECIT/NULECIT) IV  125 mg Intravenous Q T,Th,Sa-HD  . guaiFENesin-codeine  5  mL Per Tube Q6H  . insulin aspart  0-15 Units Subcutaneous Q4H  . mouth rinse  15 mL Mouth Rinse QID  . metoCLOPramide  10 mg Per Tube BID  . midodrine  10 mg Per Tube q morning - 10a  . multivitamin  1 tablet Oral QHS  . neomycin-bacitracin-polymyxin  1 application Topical Daily  . pantoprazole sodium  40 mg  Per Tube Daily  . phosphorus  500 mg Oral TID  . rifampin  300 mg Per Tube Q8H  . sennosides  5 mL Per Tube BID  . sodium chloride flush  3 mL Intravenous Q12H   Continuous Infusions: . sodium chloride 10 mL/hr at 11/14/16 2000  . feeding supplement (VITAL AF 1.2 CAL) Stopped (11/11/16 1829)  . heparin 950 Units/hr (11/14/16 2144)     LOS: 13 days   Cherene Altes, MD [facility] Office  [phone] Pager - Text Page per Amion as per below:  On-Call/Text Page:      [email or web address]      password [email or web address]  If 7PM-7AM, please contact night-coverage [email or web address] Password [email or web address] 11/15/2016, 2:36 PM

## 2016-11-15 NOTE — Progress Notes (Signed)
Pharmacy Antibiotic Note Deborah [name] is a 60 y.o. female admitted on 11/02/2016 with HCAP and MSSA bacteremia in setting of recent valve replacement; repeat blood cultures negative. Today is day 8 of abx. WBC remains elevated at 15.6 (last steroid dose 12/5), afebrile. Pt has ESRD on HD.  Plan: Continue cefazolin 1 gram IV every 24 hours F/u TEE, abx LOT   Height: 5\' 2"  (157.5 cm) Weight: 123 lb 3.8 oz (55.9 kg) IBW/kg (Calculated) : 50.1  Temp (24hrs), Avg:98.6 F (37 C), Min:98.1 F (36.7 C), Max:99.1 F (37.3 C)   Recent Labs Lab 11/11/16 0220 11/12/16 0235 11/13/16 0300 11/14/16 0523 11/15/16 0500  WBC 17.4* 18.7* 15.6* 13.6* 15.6*  CREATININE 4.35* 3.03* 4.44* 5.76* 2.83*    Estimated Creatinine Clearance: 16.9 mL/min (by C-G formula based on SCr of 2.83 mg/dL (H)).    No Known Allergies  Antimicrobials: Vanco 12/6>>12/3 Cefepime 12/6>>12/9 Rifampin 12/7 >> Cefazolin 12/9 >>    Microbiology results:  11/27 UCx: mult species 12/1 MRSA PCR: positive 12/5 BCx: BCID 2/2 MSSA 12/7 BCx: no growth final 12/9 cath tip: ngtd  Thank you for allowing pharmacy to be a part of this patient's care.  Gwenlyn Perking, PharmD PGY1 Pharmacy Resident Pager: [phone] 11/15/2016 9:03 AM

## 2016-11-15 NOTE — Progress Notes (Signed)
Name: Deborah [name] MRN: [id number] DOB: July 03, 1956    ADMISSION DATE:  11/02/2016 CONSULTATION DATE:  11/02/2016  REFERRING MD :  Dr. Regenia Skeeter EDP  CHIEF COMPLAINT:  SOB  BRIEF PATIENT DESCRIPTION: 60 year old female ESRD 10/3/0/17 with recent prolonged hospitalization secondary to papillary muscle rupture and subsequent HCAP. She was intubated requiring trach. Decannulated in the end of October. She then went to [facility] and was discharged 11/29. No presenting to [facility] ED 11/30 with compalints of SOB x1-2 weeks. She was seen by ENT they found cords were not opening from either scarring, inflammation, or nerve issue. They were hoping she would improve in the coming days, however, she did not and was taken for tracheostomy 12/7.  SIGNIFICANT EVENTS  09/12 Admit to [facility] 09/15 To [facility] 09/20 MVR with bioprosthetic valve 09/28 Permcath for HD 09/29 Extubated 09/30 Reintubated 10/05 Trach by Hyman Bible 10/09 Off vent 10/25 decannulated 11/8 to CIR 11/29 DC CIR 11/30 admit for resp distress 127 - redo trach Tracheostomy 6.4 cuffed [name]) 12/7 >> 12/09 L IJ Tunneled HD catheter removed . LUE graft being used 12/10 -  Patient did have some mild emesis overnight. Denies any abdominal pain or nausea at this time. Denies any chest pain or difficulty breathing at this time.    SUBJECTIVE/OVERNIGHT/INTERVAL HX:  Had G tube and left IJ placed 12/11 by IR. Met with palliative care 12/12, family meeting planned for tomorrow 12/14.  VITAL SIGNS: Temp:  [98.1 F (36.7 C)-99.1 F (37.3 C)] 99.1 F (37.3 C) (12/13 0832) Pulse Rate:  [71-108] 97 (12/13 0900) Resp:  [12-33] 21 (12/13 0900) BP: (92-163)/(32-98) 107/69 (12/13 0900) SpO2:  [92 %-100 %] 100 % (12/13 0900) FiO2 (%):  [40 %] 40 % (12/13 0810) Weight:  [123 lb 3.8 oz (55.9 kg)] 123 lb 3.8 oz (55.9 kg) (12/13 0447)  PHYSICAL EXAMINATION: General:  Elderly female. In NAD. Neuro:  Follows commands. No meningismus. Nods to questions. HEENT:   Moist membranes. No scleral injection. No further bleeding around tracheostomy stoma. Cardiovascular:  Regular rate. No edema or JVD. Pulmonary:  Good aeration bilaterally. Symmetric chest wall expansion on ventilator. Back on PRVC. Abdomen:  Soft. Nondistended. Normoactive bowel sounds. Integument:  Warm & dry. No rash on exposed skin.   PULMONARY  Recent Labs Lab 11/09/16 2346  PHART 7.386  PCO2ART 33.8  PO2ART 97.0  HCO3 20.0  TCO2 21  O2SAT 97.0    CBC  Recent Labs Lab 11/13/16 0300 11/14/16 0523 11/15/16 0500  HGB 10.8* 10.0* 10.0*  HCT 34.0* 31.9* 31.4*  WBC 15.6* 13.6* 15.6*  PLT 297 329 350    COAGULATION  Recent Labs Lab 11/11/16 0220 11/12/16 0235 11/13/16 0300 11/14/16 0523 11/15/16 0500  INR 1.48 1.45 1.34 1.87 2.16    CARDIAC  No results for input(s): TROPONINI in the last 168 hours. No results for input(s): PROBNP in the last 168 hours.   CHEMISTRY  Recent Labs Lab 11/11/16 0220 11/12/16 0235 11/13/16 0300 11/13/16 1043 11/14/16 0523 11/14/16 1845 11/15/16 0500  NA 136 135 136  --  135  --  136  K 3.8 3.1* 3.8  --  3.1* 3.9 3.6  CL 102 95* 98*  --  96*  --  98*  CO2 21* 30 23  --  25  --  27  GLUCOSE 198* 148* 46* 494* 115*  --  68  BUN 46* 19 26*  --  32*  --  8  CREATININE 4.35* 3.03* 4.44*  --  5.76*  --  2.83*  CALCIUM 8.4* 8.6* 8.7*  --  8.5*  --  8.0*  MG 1.7 1.7 1.7  --  1.7 2.0 2.1  PHOS 3.3 2.0* 1.7*  --  2.1*  --  1.2*   Estimated Creatinine Clearance: 16.9 mL/min (by C-G formula based on SCr of 2.83 mg/dL (H)).   LIVER  Recent Labs Lab 11/11/16 0220 11/12/16 0235 11/13/16 0300 11/14/16 0523 11/15/16 0500  ALBUMIN 2.0* 2.1* 2.1* 1.9* 1.9*  INR 1.48 1.45 1.34 1.87 2.16     INFECTIOUS No results for input(s): LATICACIDVEN, PROCALCITON in the last 168 hours.   ENDOCRINE CBG (last 3)   Recent Labs  11/15/16 0015 11/15/16 0453 11/15/16 0831  GLUCAP 162* 73 87         IMAGING x48h  - image(s)  personally visualized  -   highlighted in bold Ir Gastrostomy Tube Mod Sed  Result Date: 11/13/2016 INDICATION: 60 year old female with dysphagia. EXAM: PERC PLACEMENT GASTROSTOMY MEDICATIONS: 2.0 g Ancef; Antibiotics were administered within 1 hour of the procedure. ANESTHESIA/SEDATION: Versed 2.0 mg IV; Fentanyl 100 mcg IV Moderate Sedation Time:  10 The patient was continuously monitored during the procedure by the interventional radiology nurse under my direct supervision. CONTRAST:  10 cc - administered into the gastric lumen. FLUOROSCOPY TIME:  Fluoroscopy Time: 0 minutes 42 seconds (1 mGy). COMPLICATIONS: None PROCEDURE: Informed written consent was obtained from the patient and the patient's family after a thorough discussion of the procedural risks, benefits and alternatives. All questions were addressed. Maximal Sterile Barrier Technique was utilized including caps, mask, sterile gowns, sterile gloves, sterile drape, hand hygiene and skin antiseptic. A timeout was performed prior to the initiation of the procedure. The procedure, risks, benefits, and alternatives were explained to the patient. Questions regarding the procedure were encouraged and answered. The patient understands and consents to the procedure. The epigastrium was prepped with Betadine in a sterile fashion, and a sterile drape was applied covering the operative field. A sterile gown and sterile gloves were used for the procedure. A 5-French orogastric tube is placed under fluoroscopic guidance. Scout imaging of the abdomen confirms barium within the transverse colon. The stomach was distended with gas. Under fluoroscopic guidance, an 18 gauge needle was utilized to puncture the anterior wall of the body of the stomach. An Amplatz wire was advanced through the needle passing a T fastener into the lumen of the stomach. The T fastener was secured for gastropexy. A 9-French sheath was inserted. A snare was advanced through the 9-French  sheath. A Britta Mccreedy was advanced through the orogastric tube. It was snared then pulled out the oral cavity, pulling the snare, as well. The leading edge of the gastrostomy was attached to the snare. It was then pulled down the esophagus and out the percutaneous site. It was secured in place. Contrast was injected. No complication IMPRESSION: Status post fluoroscopic placed percutaneous gastrostomy tube, with 20 [place] pull-through. Signed, Dulcy Fanny. Earleen Newport, DO [facility] Radiology Electronically Signed   By: Corrie Mckusick D.O.   On: 11/13/2016 15:02   Ir Fluoro Guide Cv Line Left  Result Date: 11/13/2016 INDICATION: 60 year old female with bacteremia.  Renal failure. She has been referred for tunneled central catheter placement. EXAM: IMAGE GUIDED TUNNELED CENTRAL VENOUS LINE MEDICATIONS: 2.0 g Ancef; The antibiotic was administered within an appropriate time interval prior to skin puncture. ANESTHESIA/SEDATION: None FLUOROSCOPY TIME:  Fluoroscopy Time: 0 minutes 18 seconds COMPLICATIONS: None PROCEDURE: After written  informed consent was obtained, patient was placed in the supine position on angiographic table. Patency of the left internal jugular vein was confirmed with ultrasound with image documentation. Patient was prepped and draped in the usual sterile fashion including the right neck and right superior chest. Using ultrasound guidance, the skin and subcutaneous tissues overlying the right internal jugular vein were generously infiltrated with 1% lidocaine without epinephrine. Using ultrasound guidance, the right internal jugular vein was punctured with a micropuncture needle, and an 018 wire was advanced into the right heart confirming venous access. A small stab incision was made with an 11 blade scalpel. Peel-away sheath was placed over the wire, and then the wire was removed, marking the wire for estimation of internal catheter length. The chest wall was  then generously infiltrated with 1% lidocaine for local anesthesia along the tissue tract. Small stab incision was made with 11 blade scalpel, and then the catheter was back tunneled to the puncture site at the right internal jugular vein. Catheter was pulled through the tract, with the catheter amputated at 22 cm. Catheter was advanced through the peel-away sheath, and the peel-away sheath was removed. Final image was stored. The catheter was anchored to the chest wall with 2 retention sutures, and Derma bond was used to seal the right internal jugular vein incision site and at the right chest wall. Patient tolerated the procedure well and remained hemodynamically stable throughout. No complications were encountered and no significant blood loss was encountered. IMPRESSION: Status post left IJ central venous catheter with a cuffed double lumen 22 cm catheter. Catheter ready for use. Signed, Dulcy Fanny. Earleen Newport, DO [facility] Radiology Electronically Signed   By: Corrie Mckusick D.O.   On: 11/13/2016 15:01   Ir US Guide Vasc Access Left  Result Date: 11/13/2016 INDICATION: 60 year old female with bacteremia.  Renal failure. She has been referred for tunneled central catheter placement. EXAM: IMAGE GUIDED TUNNELED CENTRAL VENOUS LINE MEDICATIONS: 2.0 g Ancef; The antibiotic was administered within an appropriate time interval prior to skin puncture. ANESTHESIA/SEDATION: None FLUOROSCOPY TIME:  Fluoroscopy Time: 0 minutes 18 seconds COMPLICATIONS: None PROCEDURE: After written informed consent was obtained, patient was placed in the supine position on angiographic table. Patency of the left internal jugular vein was confirmed with ultrasound with image documentation. Patient was prepped and draped in the usual sterile fashion including the right neck and right superior chest. Using ultrasound guidance, the skin and subcutaneous tissues overlying the right internal  jugular vein were generously infiltrated with 1% lidocaine without epinephrine. Using ultrasound guidance, the right internal jugular vein was punctured with a micropuncture needle, and an 018 wire was advanced into the right heart confirming venous access. A small stab incision was made with an 11 blade scalpel. Peel-away sheath was placed over the wire, and then the wire was removed, marking the wire for estimation of internal catheter length. The chest wall was then generously infiltrated with 1% lidocaine for local anesthesia along the tissue tract. Small stab incision was made with 11 blade scalpel, and then the catheter was back tunneled to the puncture site at the right internal jugular vein. Catheter was pulled through the tract, with the catheter amputated at 22 cm. Catheter was advanced through the peel-away sheath, and the peel-away sheath was removed. Final image was stored. The catheter was anchored to the chest wall with 2 retention sutures, and Derma bond was used to seal the right internal jugular vein incision site and at  the right chest wall. Patient tolerated the procedure well and remained hemodynamically stable throughout. No complications were encountered and no significant blood loss was encountered. IMPRESSION: Status post left IJ central venous catheter with a cuffed double lumen 22 cm catheter. Catheter ready for use. Signed, Dulcy Fanny. Earleen Newport, DO [facility] Radiology Electronically Signed   By: Corrie Mckusick D.O.   On: 11/13/2016 15:01      MICROBIOLOGY: Urine Ctx 11/27:  Multiple species MRSA PCR 12/1:  Positive Blood Ctx x2 12/5:  2/2 MSSA Blood Ctx x1 12/7 >> Catheter Tip Culture 12/9 >>    ANTIBIOTICS: Cefepime 12/6 - 12/9 Vancomycin 12/6 - 12/9 Rifapmin 12/7 >> Cefazolin 12/9 >>  LINES/TUBES:  PIV  ASSESSMENT / PLAN:  60 y.o. female with Vocal cord paralysis status post repeat tracheostomy. Patient with acute on  chronic hypertension resolved after fluid bolus. Continuing on broad-spectrum antibiotics for sepsis secondary to bacteremia/healthcare associated pneumonia per ID recommendations.  Vocal Cord Paralysis:  S/P repeat trach by ENT on 12/7. Plan: Tracheostomy care per ENT.  Continuing ventilator wean & transition to tracheostomy collar hopefully (so far not been able to tolerate). Codeine/Guaifenesin liquid for coughing via tube q6hr scheduled.  Acute on Chronic Hypotension: Resolved after fluid bolus. Plan: Continuing Midodrine 39m via tube daily.  Paroxysmal Atrial Fibrillation. Plan: Continuing Amiodarone, heparin.  Continuous telemetry monitoring.  HCAP/Bacteremia/Sepsis:  Awaiting finalization of repeat culture 12/7. Plan: ID following & guiding antibiotics: Rifampin & Cefazolin.  ESRD - Tunneled HD Catheter removed 12/9. HD now via LUE graft Plan: On renal replacement therapy as per Nephrology.    Rest per primary team.   RMontey Hora PA - [facility] Pager: [phone] or ([phone]/13/2017, 11:02 AM

## 2016-11-15 NOTE — Progress Notes (Signed)
Physical Therapy Treatment Patient Details Name: Deborah [name] MRN: [id number] DOB: 06/27/1956 Today's Date: 11/15/2016    History of Present Illness Pt is a 60 y/o female admitted to [facility] on 11/30 secondary to dyspnea after being discharged on 11/29 from CIR. Pt was admitted to CIR from 11/8 through 11/29. PMH including but not limited to A-fib/flutter, DM, ESRD, and CAD s/p STEMI in September (08/15/16). Pt was intubated on 9/30 with trach placement on 10/5. She progressed to weaning off of vent on 10/09 and decannulated on 10/25. She has since had to have a second trach placement this admission on 11/09/16.    PT Comments    Pt presented supine in bed with HOB elevated, awake and willing to participate in therapy session. Prior to admission per pt's chart she d/c'd from CIR on 11/29 at w/c level. However, pt reported that she was ambulating with use of a SPC. No family or caregiver present to determine PLOF at the time of evaluation. Pt currently requires mod-max A x2 for bed mobility and mod A x2 for transfers. Pt would continue to benefit from skilled physical therapy services at this time while admitted and after d/c to address her below listed limitations in order to improve her overall safety and independence with functional mobility.   Follow Up Recommendations  SNF     Equipment Recommendations  None recommended by PT    Recommendations for Other Services       Precautions / Restrictions Precautions Precautions: Fall;Other (comment) (trach, vent, peg, NG) Restrictions Weight Bearing Restrictions: No    Mobility  Bed Mobility Overal bed mobility: Needs Assistance Bed Mobility: Rolling;Sidelying to Sit;Sit to Supine Rolling: Supervision Sidelying to sit: Mod assist;+2 for physical assistance;+2 for safety/equipment   Sit to supine: Max assist;+2 for physical assistance;+2 for safety/equipment   General bed mobility comments: pt able to roll in bed with use of bed rails and  supervision for safety. To achieve sitting EOB from sidelying, pt required mod A x2 at trunk and use of bed pad to position hips. To return to supine from sitting, pt required max A x2 at bilateral LEs and trunk  Transfers Overall transfer level: Needs assistance Equipment used: 2 person hand held assist Transfers: Sit to/from Stand Sit to Stand: Mod assist;+2 physical assistance;+2 safety/equipment         General transfer comment: pt required increased time and mod A x2 to achieve standing from bed with RN present to assist with lines/tubes. Pt performed sit-to-stand from bed x2.  Ambulation/Gait                 Stairs            Wheelchair Mobility    Modified Rankin (Stroke Patients Only)       Balance Overall balance assessment: Needs assistance Sitting-balance support: Feet supported;Bilateral upper extremity supported Sitting balance-Leahy Scale: Poor Sitting balance - Comments: pt reliant on bilateral UEs to maintain upright sitting balance, min guard for static sitting for safety   Standing balance support: During functional activity;Bilateral upper extremity supported Standing balance-Leahy Scale: Poor Standing balance comment: pt reliant on bilateral UEs on therapist and tech (2 person HHA)                    Cognition Arousal/Alertness: Awake/alert Behavior During Therapy: WFL for tasks assessed/performed;Flat affect Overall Cognitive Status: Difficult to assess  Exercises      General Comments        Pertinent Vitals/Pain Pain Assessment: No/denies pain    Home Living Family/patient expects to be discharged to:: Unsure Living Arrangements: Children             Additional Comments: Difficult to assess secondary to trach with pt on vent    Prior Function Level of Independence: Needs assistance  Gait / Transfers Assistance Needed: per pt's chart, after d/c from CIR pt was using a w/c for  mobility   Comments: difficult to assess as pt is with trach on a vent   PT Goals (current goals can now be found in the care plan section) Acute Rehab PT Goals Patient Stated Goal: none stated PT Goal Formulation: With patient Time For Goal Achievement: 11/29/16 Potential to Achieve Goals: Fair    Frequency    Min 3X/week      PT Plan      Co-evaluation             End of Session Equipment Utilized During Treatment: Gait belt;Other (comment) (vent) Activity Tolerance: Patient limited by fatigue Patient left: in bed;with call bell/phone within reach;with SCD's reapplied     Time: 4314-2767 PT Time Calculation (min) (ACUTE ONLY): 24 min  Charges:  $Therapeutic Activity: 8-22 mins                    G CodesClearnce Sorrel Liley Rake November 21, 2016, 3:22 PM Sherie Don, [place], DPT [phone]

## 2016-11-16 DIAGNOSIS — Z7189 Other specified counseling: Secondary | ICD-10-CM

## 2016-11-16 LAB — PROTIME-INR
INR: 2.88
PROTHROMBIN TIME: 30.8 s — AB (ref 11.4–15.2)

## 2016-11-16 LAB — GLUCOSE, CAPILLARY
GLUCOSE-CAPILLARY: 144 mg/dL — AB (ref 65–99)
GLUCOSE-CAPILLARY: 203 mg/dL — AB (ref 65–99)
GLUCOSE-CAPILLARY: 221 mg/dL — AB (ref 65–99)
GLUCOSE-CAPILLARY: 279 mg/dL — AB (ref 65–99)
Glucose-Capillary: 274 mg/dL — ABNORMAL HIGH (ref 65–99)
Glucose-Capillary: 182 mg/dL — ABNORMAL HIGH (ref 65–99)

## 2016-11-16 LAB — CBC
HCT: 31.3 % — ABNORMAL LOW (ref 36.0–46.0)
HEMOGLOBIN: 9.7 g/dL — AB (ref 12.0–15.0)
MCH: 27.2 pg (ref 26.0–34.0)
MCHC: 31 g/dL (ref 30.0–36.0)
MCV: 87.9 fL (ref 78.0–100.0)
Platelets: 354 10*3/uL (ref 150–400)
RBC: 3.56 MIL/uL — AB (ref 3.87–5.11)
RDW: 19.2 % — ABNORMAL HIGH (ref 11.5–15.5)
WBC: 15.2 10*3/uL — ABNORMAL HIGH (ref 4.0–10.5)

## 2016-11-16 LAB — RENAL FUNCTION PANEL
ANION GAP: 12 (ref 5–15)
Albumin: 1.8 g/dL — ABNORMAL LOW (ref 3.5–5.0)
BUN: 13 mg/dL (ref 6–20)
CHLORIDE: 95 mmol/L — AB (ref 101–111)
CO2: 27 mmol/L (ref 22–32)
Calcium: 7.8 mg/dL — ABNORMAL LOW (ref 8.9–10.3)
Creatinine, Ser: 4.1 mg/dL — ABNORMAL HIGH (ref 0.44–1.00)
GFR calc non Af Amer: 11 mL/min — ABNORMAL LOW (ref >60)
GFR, EST AFRICAN AMERICAN: 13 mL/min — AB (ref >60)
Glucose, Bld: 147 mg/dL — ABNORMAL HIGH (ref 65–99)
Phosphorus: 3.4 mg/dL (ref 2.5–4.6)
Potassium: 3.2 mmol/L — ABNORMAL LOW (ref 3.5–5.1)
Sodium: 134 mmol/L — ABNORMAL LOW (ref 135–145)

## 2016-11-16 LAB — HEPARIN LEVEL (UNFRACTIONATED): Heparin Unfractionated: 0.47 IU/mL (ref 0.30–0.70)

## 2016-11-16 MED ORDER — POTASSIUM CHLORIDE CRYS ER 20 MEQ PO TBCR
40.0000 meq | EXTENDED_RELEASE_TABLET | Freq: Once | ORAL | Status: DC
  Filled 2016-11-16: qty 2

## 2016-11-16 MED ORDER — POTASSIUM CHLORIDE 20 MEQ/15ML (10%) PO SOLN
40.0000 meq | Freq: Once | ORAL | Status: AC
  Administered 2016-11-16: 40 meq
  Filled 2016-11-16: qty 30

## 2016-11-16 MED ORDER — DARBEPOETIN ALFA 100 MCG/0.5ML IJ SOSY
100.0000 ug | PREFILLED_SYRINGE | INTRAMUSCULAR | Status: DC
  Administered 2016-11-18 – 2016-11-25 (×2): 100 ug via INTRAVENOUS
  Filled 2016-11-16 (×3): qty 0.5

## 2016-11-16 MED ORDER — WARFARIN SODIUM 2 MG PO TABS
2.0000 mg | ORAL_TABLET | Freq: Once | ORAL | Status: AC
  Administered 2016-11-16: 2 mg via ORAL
  Filled 2016-11-16: qty 1

## 2016-11-16 NOTE — Consult Note (Addendum)
[place] Nurse wound consult note Reason for Consult: Consult requested for buttocks/sacrum wounds.  Pt was noted to have a stage 2 pressure injury on 11/30, present on admission according to the nursing flow sheet. Wound type: Stage 2 pressure injury to sacrum; 1.5X1.5X.1cm, pink and moist.  Surrounded by patchy areas of red macerated skin which is beginning to peel; appearance consistent with moisture associated skin damage across bilat buttocks, gluteal fold, and sacrum. Pressure Ulcer POA: Yes Dressing procedure/placement/frequency: Foam dressing to protect and repel moisture. Family members present in room to discuss plan of care. Please re-consult if further assistance is needed.  Thank-you,  Julien Girt MSN, [place], [place], [place], [place]

## 2016-11-16 NOTE — Plan of Care (Signed)
Problem: Nutrition: Goal: Adequate nutrition will be maintained Outcome: Progressing Tube feeding started today Vital AF1.2 @50  ml/hr goal.

## 2016-11-16 NOTE — Progress Notes (Signed)
SLP Cancellation Note  Patient Details Name: CHASTITY NOLAND MRN: [id number] DOB: 22-Feb-1956   Cancelled treatment:        Had arranged inline vent Passy-Muir speaking valve with RT. Pt receiving HD in room currently and HD RN requested that we defer PMS assessment. Will plan to see next date.    [place] Siren 11/16/2016, 2:55 PM  Orbie Pyo Colvin Caroli.Ed [employer] [phone]

## 2016-11-16 NOTE — Progress Notes (Signed)
INFECTIOUS DISEASE PROGRESS NOTE  ID: Deborah [name] is a 60 y.o. female with  Principal Problem:   Staphylococcus aureus bacteremia Active Problems:   Mitral regurgitation   S/P MVR (mitral valve replacement)   HCAP (healthcare-associated pneumonia)   Diabetes mellitus type 2 in nonobese ([name])   ESRD on dialysis ([place])   Anemia of chronic disease   Atrial flutter ([name])   Respiratory distress   Vocal cord paralysis   Bacteremia   End-stage renal disease on hemodialysis ([name])   Paroxysmal atrial fibrillation ([place])   Heart attack   Uncontrolled type 2 diabetes mellitus with complication, without long-term current use of insulin ([place])   Advance care planning   Vocal cord dysfunction   Palliative care by specialist   Goals of care, counseling/discussion  Subjective: without complaints On HD  Abtx:  Anti-infectives    Start     Dose/Rate Route Frequency Ordered Stop   11/13/16 1410  ceFAZolin (ANCEF) 2-4 GM/100ML-% IVPB    Comments:  Shaaron Adler   : cabinet override      11/13/16 1410 11/13/16 1432   11/11/16 1800  ceFAZolin (ANCEF) IVPB 1 g/50 mL premix     1 g 100 mL/hr over 30 Minutes Intravenous Every 24 hours 11/11/16 1425     11/10/16 1400  rifampin (RIFADIN) 60 mg/mL oral suspension 300 mg     300 mg Per Tube Every 8 hours 11/10/16 1029     11/10/16 0815  vancomycin (VANCOCIN) 500 mg in sodium chloride 0.9 % 100 mL IVPB     500 mg 100 mL/hr over 60 Minutes Intravenous  Once 11/10/16 0802 11/10/16 1143   11/09/16 1830  rifampin (RIFADIN) 60 mg/mL oral suspension 300 mg  Status:  Discontinued     300 mg Oral Every 8 hours 11/09/16 1711 11/10/16 1029   11/09/16 1200  vancomycin (VANCOCIN) 500 mg in sodium chloride 0.9 % 100 mL IVPB  Status:  Discontinued     500 mg 100 mL/hr over 60 Minutes Intravenous Every T-Th-Sa (Hemodialysis) 11/08/16 0919 11/11/16 1424   11/08/16 1000  ceFEPIme (MAXIPIME) 1 g in dextrose 5 % 50 mL IVPB  Status:  Discontinued     1 g 100  mL/hr over 30 Minutes Intravenous Every 24 hours 11/08/16 0919 11/11/16 1424   11/08/16 0930  vancomycin (VANCOCIN) 1,250 mg in sodium chloride 0.9 % 250 mL IVPB     1,250 mg 166.7 mL/hr over 90 Minutes Intravenous  Once 11/08/16 0919 11/08/16 1415      Medications:  Scheduled: . amiodarone  200 mg Per Tube Daily  . atorvastatin  20 mg Per Tube q1800  .  ceFAZolin (ANCEF) IV  1 g Intravenous Q24H  . chlorhexidine gluconate (MEDLINE KIT)  15 mL Mouth Rinse BID  . clonazePAM  0.5 mg Per Tube BID  . clopidogrel  75 mg Per Tube Daily  . darbepoetin (ARANESP) injection - DIALYSIS  60 mcg Intravenous Q Sat-HD  . docusate  100 mg Per Tube BID  . ferric gluconate (FERRLECIT/NULECIT) IV  125 mg Intravenous Q T,Th,Sa-HD  . guaiFENesin-codeine  5 mL Per Tube Q6H  . insulin aspart  0-9 Units Subcutaneous Q4H  . mouth rinse  15 mL Mouth Rinse QID  . metoCLOPramide  10 mg Per Tube BID  . midodrine  10 mg Per Tube q morning - 10a  . multivitamin  1 tablet Oral QHS  . neomycin-bacitracin-polymyxin  1 application Topical Daily  . pantoprazole sodium  40 mg Per Tube Daily  . rifampin  300 mg Per Tube Q8H  . sennosides  5 mL Per Tube BID  . sodium chloride flush  3 mL Intravenous Q12H  . warfarin  2 mg Oral ONCE-1800  . Warfarin - Pharmacist Dosing Inpatient   Does not apply q1800    Objective: Vital signs in last 24 hours: Temp:  [98 F (36.7 C)-98.7 F (37.1 C)] 98.1 F (36.7 C) (12/14 1233) Pulse Rate:  [74-93] 93 (12/14 1410) Resp:  [14-21] 21 (12/14 1410) BP: (90-114)/(55-62) 92/56 (12/14 1410) SpO2:  [99 %-100 %] 100 % (12/14 1410) FiO2 (%):  [40 %] 40 % (12/14 1132) Weight:  [56 kg (123 lb 7.3 oz)] 56 kg (123 lb 7.3 oz) (12/14 0353)   General appearance: alert, cooperative and no distress Resp: diminished breath sounds bilaterally Cardio: regular rate and rhythm GI: normal findings: bowel sounds normal and soft, non-tender  Lab Results  Recent Labs  11/15/16 0500  11/16/16 0500  WBC 15.6* 15.2*  HGB 10.0* 9.7*  HCT 31.4* 31.3*  NA 136 134*  K 3.6 3.2*  CL 98* 95*  CO2 27 27  BUN 8 13  CREATININE 2.83* 4.10*   Liver Panel  Recent Labs  11/15/16 0500 11/16/16 0500  ALBUMIN 1.9* 1.8*   Sedimentation Rate No results for input(s): ESRSEDRATE in the last 72 hours. C-Reactive Protein No results for input(s): CRP in the last 72 hours.  Microbiology: Recent Results (from the past 240 hour(s))  Culture, blood (Routine X 2) w Reflex to ID Panel     Status: Abnormal   Collection Time: 11/07/16  7:02 PM  Result Value Ref Range Status   Specimen Description BLOOD RIGHT ANTECUBITAL  Final   Special Requests IN PEDIATRIC BOTTLE 2CC  Final   Culture  Setup Time   Final    AEROBIC BOTTLE ONLY GRAM POSITIVE COCCI Organism ID to follow CRITICAL RESULT CALLED TO, READ BACK BY AND VERIFIED WITH: Narda Bonds, PHARMD @2343  11/08/16 MKELLY,MLT    Culture STAPHYLOCOCCUS AUREUS (A)  Final   Report Status 11/11/2016 FINAL  Final   Organism ID, Bacteria STAPHYLOCOCCUS AUREUS  Final      Susceptibility   Staphylococcus aureus - MIC*    CIPROFLOXACIN 2 INTERMEDIATE Intermediate     ERYTHROMYCIN >=8 RESISTANT Resistant     GENTAMICIN <=0.5 SENSITIVE Sensitive     OXACILLIN 0.5 SENSITIVE Sensitive     TETRACYCLINE <=1 SENSITIVE Sensitive     VANCOMYCIN <=0.5 SENSITIVE Sensitive     TRIMETH/SULFA <=10 SENSITIVE Sensitive     CLINDAMYCIN <=0.25 RESISTANT Resistant     RIFAMPIN <=0.5 SENSITIVE Sensitive     Inducible Clindamycin POSITIVE Resistant     * STAPHYLOCOCCUS AUREUS  Blood Culture ID Panel (Reflexed)     Status: Abnormal   Collection Time: 11/07/16  7:02 PM  Result Value Ref Range Status   Enterococcus species NOT DETECTED NOT DETECTED Final   Listeria monocytogenes NOT DETECTED NOT DETECTED Final   Staphylococcus species DETECTED (A) NOT DETECTED Final    Comment: CRITICAL RESULT CALLED TO, READ BACK BY AND VERIFIED WITH: JAMES LEDFORD,  PHARMD @2343  11/08/16 MKELLY,MLT    Staphylococcus aureus DETECTED (A) NOT DETECTED Final    Comment: CRITICAL RESULT CALLED TO, READ BACK BY AND VERIFIED WITH: JAMES LEDFORD, PHARMD @2343  11/08/16 MKELLY,MLT    Methicillin resistance NOT DETECTED NOT DETECTED Final   Streptococcus species NOT DETECTED NOT DETECTED Final   Streptococcus agalactiae NOT DETECTED NOT  DETECTED Final   Streptococcus pneumoniae NOT DETECTED NOT DETECTED Final   Streptococcus pyogenes NOT DETECTED NOT DETECTED Final   Acinetobacter baumannii NOT DETECTED NOT DETECTED Final   Enterobacteriaceae species NOT DETECTED NOT DETECTED Final   Enterobacter cloacae complex NOT DETECTED NOT DETECTED Final   Escherichia coli NOT DETECTED NOT DETECTED Final   Klebsiella oxytoca NOT DETECTED NOT DETECTED Final   Klebsiella pneumoniae NOT DETECTED NOT DETECTED Final   Proteus species NOT DETECTED NOT DETECTED Final   Serratia marcescens NOT DETECTED NOT DETECTED Final   Haemophilus influenzae NOT DETECTED NOT DETECTED Final   Neisseria meningitidis NOT DETECTED NOT DETECTED Final   Pseudomonas aeruginosa NOT DETECTED NOT DETECTED Final   Candida albicans NOT DETECTED NOT DETECTED Final   Candida glabrata NOT DETECTED NOT DETECTED Final   Candida krusei NOT DETECTED NOT DETECTED Final   Candida parapsilosis NOT DETECTED NOT DETECTED Final   Candida tropicalis NOT DETECTED NOT DETECTED Final  Culture, blood (Routine X 2) w Reflex to ID Panel     Status: Abnormal   Collection Time: 11/07/16  7:08 PM  Result Value Ref Range Status   Specimen Description BLOOD RIGHT HAND  Final   Special Requests BOTTLES DRAWN AEROBIC ONLY 5CC  Final   Culture  Setup Time   Final    GRAM POSITIVE COCCI IN CLUSTERS AEROBIC BOTTLE ONLY CRITICAL VALUE NOTED.  VALUE IS CONSISTENT WITH PREVIOUSLY REPORTED AND CALLED VALUE.    Culture (A)  Final    STAPHYLOCOCCUS AUREUS SUSCEPTIBILITIES PERFORMED ON PREVIOUS CULTURE WITHIN THE LAST 5 DAYS.     Report Status 11/11/2016 FINAL  Final  Culture, blood (Routine X 2) w Reflex to ID Panel     Status: None   Collection Time: 11/09/16  6:05 PM  Result Value Ref Range Status   Specimen Description BLOOD RIGHT HAND  Final   Special Requests BOTTLES DRAWN AEROBIC ONLY [place]  Final   Culture NO GROWTH 5 DAYS  Final   Report Status 11/14/2016 FINAL  Final  Cath Tip Culture     Status: None   Collection Time: 11/11/16  2:58 PM  Result Value Ref Range Status   Specimen Description CATH TIP  Final   Special Requests NONE  Final   Culture NO GROWTH 3 DAYS  Final   Report Status 11/15/2016 FINAL  Final    Studies/Results: No results found.   Assessment/Plan: MSSAbacteremia HCAP Steroid induced leukocytosis Prev CV surgery (MV bioprosthesis) Vocal cord paralysis, trach redo 12-7 PEG 12-11  WBCs stable today Continuerifampin (prev LFTs normal) HD line out on 12-7, new line in R chest.  Await TEE to determine LOT Repeat BCx 12-7 negative  Total days of antibiotics: 9(ancef/rifampin)         Bobby Rumpf Infectious Diseases (pager) [phone] [email or web address] 11/16/2016, 2:43 PM  LOS: 14 days

## 2016-11-16 NOTE — Progress Notes (Signed)
CSW consulted for possible vent/dialysis SNF placement.  Palliative had discussion today with several family members and per notes will be talking with additional family tomorrow- CSW will continue to follow and assist with disposition needs pending patient and family decisions.  Jorge Ny, LCSW Clinical Social Worker [phone]

## 2016-11-16 NOTE — Progress Notes (Signed)
Nutrition Consult/Brief Note  RD consulted this AM for TF initiation & management.  Palliative Care NP speaking with family upon visit; did not disturb.  Spoke with RN.  Vital AF 1.2 formula currently infusing at goal rate of 50 ml/hr via PEG tube.  Tolerating well.  NGT discontinued.  Will continue to follow.  Arthur Holms, RD, LDN Pager #: [phone] After-Hours Pager #: [phone]

## 2016-11-16 NOTE — Progress Notes (Signed)
[facility] Progress Note   Subjective: Seen in room, dialysis session just getting started. Denies pain today. Awake, still on vent with trach.  Objective Vitals:   11/16/16 0816 11/16/16 1132 11/16/16 1233 11/16/16 1410  BP:   105/60 (!) 92/56  Pulse: 93 90  93  Resp: 14 19  (!) 21  Temp:   98.1 F (36.7 C)   TempSrc:   Oral   SpO2: 100% 100%  100%  Weight:      Height:       Physical Exam General: Awake; with trach/on vent. NAD Heart: RRR; no murmur Lungs: Clear anteriorly; ventilated Abdomen: soft, non-tender Extremities: No LE edema Dialysis Access: LUE AVG + thrill  Additional Objective Labs: Basic Metabolic Panel:  Recent Labs Lab 11/14/16 0523 11/14/16 1845 11/15/16 0500 11/16/16 0500  NA 135  --  136 134*  K 3.1* 3.9 3.6 3.2*  CL 96*  --  98* 95*  CO2 25  --  27 27  GLUCOSE 115*  --  68 147*  BUN 32*  --  8 13  CREATININE 5.76*  --  2.83* 4.10*  CALCIUM 8.5*  --  8.0* 7.8*  PHOS 2.1*  --  1.2* 3.4   Liver Function Tests:  Recent Labs Lab 11/14/16 0523 11/15/16 0500 11/16/16 0500  ALBUMIN 1.9* 1.9* 1.8*   CBC:  Recent Labs Lab 11/12/16 0235 11/13/16 0300 11/14/16 0523 11/15/16 0500 11/16/16 0500  WBC 18.7* 15.6* 13.6* 15.6* 15.2*  HGB 10.6* 10.8* 10.0* 10.0* 9.7*  HCT 34.0* 34.0* 31.9* 31.4* 31.3*  MCV 87.2 86.1 86.2 85.8 87.9  PLT 251 297 329 350 354   Blood Culture    Component Value Date/Time   SDES CATH TIP 11/11/2016 1458   SPECREQUEST NONE 11/11/2016 1458   CULT NO GROWTH 3 DAYS 11/11/2016 1458   REPTSTATUS 11/15/2016 FINAL 11/11/2016 1458   CBG:  Recent Labs Lab 11/15/16 2017 11/16/16 0022 11/16/16 0321 11/16/16 0801 11/16/16 1231  GLUCAP 122* 221* 144* 279* 274*   Medications: . sodium chloride 10 mL/hr at 11/14/16 2000  . feeding supplement (VITAL AF 1.2 CAL) 1,000 mL (11/16/16 0607)   . amiodarone  200 mg Per Tube Daily  . atorvastatin  20 mg Per Tube q1800  .  ceFAZolin (ANCEF) IV  1 g  Intravenous Q24H  . chlorhexidine gluconate (MEDLINE KIT)  15 mL Mouth Rinse BID  . clonazePAM  0.5 mg Per Tube BID  . clopidogrel  75 mg Per Tube Daily  . darbepoetin (ARANESP) injection - DIALYSIS  60 mcg Intravenous Q Sat-HD  . docusate  100 mg Per Tube BID  . ferric gluconate (FERRLECIT/NULECIT) IV  125 mg Intravenous Q T,Th,Sa-HD  . guaiFENesin-codeine  5 mL Per Tube Q6H  . insulin aspart  0-9 Units Subcutaneous Q4H  . mouth rinse  15 mL Mouth Rinse QID  . metoCLOPramide  10 mg Per Tube BID  . midodrine  10 mg Per Tube q morning - 10a  . multivitamin  1 tablet Oral QHS  . neomycin-bacitracin-polymyxin  1 application Topical Daily  . pantoprazole sodium  40 mg Per Tube Daily  . rifampin  300 mg Per Tube Q8H  . sennosides  5 mL Per Tube BID  . sodium chloride flush  3 mL Intravenous Q12H  . warfarin  2 mg Oral ONCE-1800  . Warfarin - Pharmacist Dosing Inpatient   Does not apply q1800    Dialysis Orders: [facility] on TTS schedule. 4 hours,  2K/2.5Ca bath, no EDW (only 1 OP dialysis), prior L IJ (removed), now using LUE AVG  Assessment/Plan: 1. MSSA bacteremia/HD line sepsis: BCx 12/05 grew MSSA, s/p HD cath removal, using L AVG. ID following:On Cefazolin/Rifampin. Plan for TEE soon. 2. Vocal cord paralysis, s/p repeat trach 12/7: Remains on vent/full support. Per PCCM.  3. ESRD: Continue TTS HD, just started on HD Sept 2017. 4. Anemia: Hgb 9.9. Continue Aranesp weekly, increase dose 60 ->144mg 5. MBD: Ca/Phos/PTH ok. No meds for now. 6. BP/Volume: Weighs reasonnable stable ~55-56kg. No edema on exam, running even today. CXR 12/8 without pulm edema. 7. Nutrition: Albumin 1.8. Tube feeds ongoing. 8. Hypophosphatemia: Better today. 9. Hypokalemia: 4K bath with HD. 10. Hx MV replacement: Per primary. 11. Atrial fibrillation: Per primary. On amiodarone/warfarin. 12. DM: Per primary.  KVeneta Penton PA-C 11/16/2016, 2:43 PM  [facility] Pager: [phone] Pt seen, examined and agree w A/P as above.  RKelly SplinterMD CNewell Rubbermaidpager [phone]  11/16/2016, 3:19 PM

## 2016-11-16 NOTE — Progress Notes (Signed)
Called to room by patient's daughter, [name], and she asked me as to which MD came to see her mother. I explained to her that it was Danna Hefty.  "Is he a doctor?  We need Dr. Lawson Fiscal to come and see my mom."  Gerald Stabs was notified of this request and NP stated that patient refused TEE.  Per NP they were only consulted.

## 2016-11-16 NOTE — Care Management Note (Signed)
Case Management Note  Patient Details  Name: Deborah [name] MRN: [id number] Date of Birth: 11/20/1956  Subjective/Objective:   Pt transferred from 48M with trach, PEG, on vent, not weaning per PCCM, also ESRD on HD.  Discussed with PC NP that pt will likely need transfer to vent SNF in [place] or [place].  CSW following.                               Expected Discharge Plan:  Skilled Nursing Facility  In-House Referral:  Clinical Social Work  Discharge planning Services  CM Consult  Status of Service:  In process, will continue to follow  Girard Cooter, RN 11/16/2016, 11:47 AM

## 2016-11-16 NOTE — Progress Notes (Addendum)
[place] TEAM 1 - Stepdown/ICU TEAM  Deborah [name]  [name]:[id number] DOB: 06/06/1956 DOA: 11/02/2016 PCP: Rica Mast, MD    Brief Narrative:  6-yo F Hx Prolonged intubation requiring tracheostomy placement and subsequent decannulation, STEMI with Papillary Muscle Rupture requiring surgical repair in September, Atrial Flutter S/P cardioversion on Coumadin, HTN, DM2, HLD, and ESRD on HD (T/TH/Sat) who presented with labored breathing during her hemodialysis session. She was intubated requiring trach. Decannulated in the end of October. She then went to [facility] and was discharged 11/29. No presenting to [facility] ED 11/30 with compalints of SOB x1-2 weeks. She was seen by ENT they found cords were not opening from either scarring, inflammation, or nerve issue. They were hoping she would improve in the coming days, however, she did not and was taken for tracheostomy 12/7. Now with MSSA bacteremia,hcap, new PEG 12/11.  Significant Events: 09/12 Admit to [facility] 09/15 Tx to [facility] 09/20 MVR with bioprosthetic valve 09/28 Permcath for HD 09/29 Extubated 09/30 Reintubated 10/05 Trach by Hyman Bible 10/09 Off vent 10/25 Decannulated 11/8 to CIR 11/29 DC from CIR 11/30 admit for resp distress 127 redo trach Tracheostomy 6.4 cuffed [name] - ENT)  12/09 L IJ Tunneled HD catheter removed  12/11 PEG tube in IR - L IJ double lumen cath in IR   Subjective: Patient is resting comfortably in bed.  She denies chest pain fevers chills or abdominal pain.  When I ask her if she is short of breath she shakes her head to indicate that she is not.  Assessment & Plan: Vocal cord paralysis S/P repeat tracheostomy by ENT 12/7 Acute on chronic respiratory failure status post   tracheostomy tracheostomy care and vent management per ENT/PCCM on vent/full support Family meeting, called in by palliative care. Patient's family has concerns about ventilatory dependence,  pulmonary notified that family had questions for  them  HCAP ID following &guiding antibiotics,Blood Ctx x2 12/5:  2/2 MSSA  MSSA Bacteremia  Presumed HD cath related - HD cath out per ID - to have TEE , contacted cardiology to confirm that she is on the schedule for TEE - ID following   ESRD on HD (T/TH/Sat) HD continues via AVG - Nephrology following ,started HD Sept 2017 HD 12/14  Hypokalemia Continue to replete   Hypomagnesemia Corrected  Paroxysmal Atrial Fibrillation Rate controlled - on amiodarone , heparin discontinued given therapeutic INR  Bioprosthetic heart valve ID requesting TEE  Acute on Chronic Hypotension Cont midodrine - blood pressure stable  Inferior STEMI S/P DES x 05 August 2016 continue Plavix  DM2 9/12 A1c 13.3  Anxiety Appears controlled at the present time  Nutrition: Albumin 1.9. Tube feeding on hold. Peg in place  DVT prophylaxis: IV heparin Code Status: FULL CODE Family Communication: whole extended family and daughter by the bedside  Disposition Plan: SDU, palliative care consult Met with palliative care 12/12, family meeting planned for   12/14.  Consultants:  PCCM Nephrology  ID ENT  Antimicrobials:  Cefepime 12/6 - 12/9 Vancomycin 12/6 - 12/9 Rifapmin 12/7 > Cefazolin 12/9 >  Objective: Blood pressure 107/60, pulse 90, temperature 98 F (36.7 C), temperature source Oral, resp. rate 19, height _0  (1.575 m), weight 56 kg (123 lb 7.3 oz), SpO2 100 %.  Intake/Output Summary (Last 24 hours) at 11/16/16 0752 Last data filed at 11/16/16 0700  Gross per 24 hour  Intake          1050.85 ml  Output  20 ml  Net          1030.85 ml   Filed Weights   11/14/16 1713 11/15/16 0447 11/16/16 0353  Weight: 55.9 kg (123 lb 3.8 oz) 55.9 kg (123 lb 3.8 oz) 56 kg (123 lb 7.3 oz)    Examination: General:  Elderly female. In NAD. Neuro:  Follows commands. No meningismus. Nods to questions. HEENT:  Moist membranes. No scleral injection. No further bleeding  around tracheostomy stoma. Cardiovascular:  Regular rate. No edema or JVD. Pulmonary:  Good aeration bilaterally. Symmetric chest wall expansion on ventilator. Back on PRVC. Abdomen:  Soft. Nondistended. Normoactive bowel sounds. Integument:  Warm & dry. No rash on exposed skin.   CBC:  Recent Labs Lab 11/12/16 0235 11/13/16 0300 11/14/16 0523 11/15/16 0500 11/16/16 0500  WBC 18.7* 15.6* 13.6* 15.6* 15.2*  HGB 10.6* 10.8* 10.0* 10.0* 9.7*  HCT 34.0* 34.0* 31.9* 31.4* 31.3*  MCV 87.2 86.1 86.2 85.8 87.9  PLT 251 297 329 350 737   Basic Metabolic Panel:  Recent Labs Lab 11/12/16 0235 11/13/16 0300 11/13/16 1043 11/14/16 0523 11/14/16 1845 11/15/16 0500 11/16/16 0500  NA 135 136  --  135  --  136 134*  K 3.1* 3.8  --  3.1* 3.9 3.6 3.2*  CL 95* 98*  --  96*  --  98* 95*  CO2 30 23  --  25  --  27 27  GLUCOSE 148* 46* 494* 115*  --  68 147*  BUN 19 26*  --  32*  --  8 13  CREATININE 3.03* 4.44*  --  5.76*  --  2.83* 4.10*  CALCIUM 8.6* 8.7*  --  8.5*  --  8.0* 7.8*  MG 1.7 1.7  --  1.7 2.0 2.1  --   PHOS 2.0* 1.7*  --  2.1*  --  1.2* 3.4   GFR: Estimated Creatinine Clearance: 11.7 mL/min (by C-G formula based on SCr of 4.1 mg/dL (H)).  Liver Function Tests:  Recent Labs Lab 11/12/16 0235 11/13/16 0300 11/14/16 0523 11/15/16 0500 11/16/16 0500  ALBUMIN 2.1* 2.1* 1.9* 1.9* 1.8*    Coagulation Profile:  Recent Labs Lab 11/12/16 0235 11/13/16 0300 11/14/16 0523 11/15/16 0500 11/16/16 0500  INR 1.45 1.34 1.87 2.16 2.88    HbA1C: Hgb A1c MFr Bld  Date/Time Value Ref Range Status  08/15/2016 01:38 PM 13.3 (H) 4.8 - 5.6 % Final    Comment:    (NOTE)         Pre-diabetes: 5.7 - 6.4         Diabetes: >6.4         Glycemic control for adults with diabetes: <7.0   07/09/2014 01:54 PM 7.6 (H) 4.6 - 6.5 % Final    Comment:    Glycemic Control Guidelines for People with Diabetes:Non Diabetic:  <6%Goal of Therapy: <7%Additional Action Suggested:  >8%      CBG:  Recent Labs Lab 11/15/16 1214 11/15/16 1703 11/15/16 2017 11/16/16 0022 11/16/16 0321  GLUCAP 94 117* 122* 221* 144*    Recent Results (from the past 240 hour(s))  Culture, blood (Routine X 2) w Reflex to ID Panel     Status: Abnormal   Collection Time: 11/07/16  7:02 PM  Result Value Ref Range Status   Specimen Description BLOOD RIGHT ANTECUBITAL  Final   Special Requests IN PEDIATRIC BOTTLE 2CC  Final   Culture  Setup Time   Final    AEROBIC BOTTLE ONLY [place]  Organism ID to follow CRITICAL RESULT CALLED TO, READ BACK BY AND VERIFIED WITH: Narda Bonds, PHARMD _0  11/08/16 MKELLY,MLT    Culture STAPHYLOCOCCUS AUREUS (A)  Final   Report Status 11/11/2016 FINAL  Final   Organism ID, Bacteria STAPHYLOCOCCUS AUREUS  Final      Susceptibility   Staphylococcus aureus - MIC*    CIPROFLOXACIN 2 INTERMEDIATE Intermediate     ERYTHROMYCIN >=8 RESISTANT Resistant     GENTAMICIN <=0.5 SENSITIVE Sensitive     OXACILLIN 0.5 SENSITIVE Sensitive     TETRACYCLINE <=1 SENSITIVE Sensitive     VANCOMYCIN <=0.5 SENSITIVE Sensitive     TRIMETH/SULFA <=10 SENSITIVE Sensitive     CLINDAMYCIN <=0.25 RESISTANT Resistant     RIFAMPIN <=0.5 SENSITIVE Sensitive     Inducible Clindamycin POSITIVE Resistant     * STAPHYLOCOCCUS AUREUS  Blood Culture ID Panel (Reflexed)     Status: Abnormal   Collection Time: 11/07/16  7:02 PM  Result Value Ref Range Status   Enterococcus species NOT DETECTED NOT DETECTED Final   Listeria monocytogenes NOT DETECTED NOT DETECTED Final   Staphylococcus species DETECTED (A) NOT DETECTED Final    Comment: CRITICAL RESULT CALLED TO, READ BACK BY AND VERIFIED WITH: JAMES LEDFORD, PHARMD _1  11/08/16 MKELLY,MLT    Staphylococcus aureus DETECTED (A) NOT DETECTED Final    Comment: CRITICAL RESULT CALLED TO, READ BACK BY AND VERIFIED WITH: JAMES LEDFORD, PHARMD _2  11/08/16 MKELLY,MLT    Methicillin resistance NOT DETECTED NOT DETECTED  Final   Streptococcus species NOT DETECTED NOT DETECTED Final   Streptococcus agalactiae NOT DETECTED NOT DETECTED Final   Streptococcus pneumoniae NOT DETECTED NOT DETECTED Final   Streptococcus pyogenes NOT DETECTED NOT DETECTED Final   Acinetobacter baumannii NOT DETECTED NOT DETECTED Final   Enterobacteriaceae species NOT DETECTED NOT DETECTED Final   Enterobacter cloacae complex NOT DETECTED NOT DETECTED Final   Escherichia coli NOT DETECTED NOT DETECTED Final   Klebsiella oxytoca NOT DETECTED NOT DETECTED Final   Klebsiella pneumoniae NOT DETECTED NOT DETECTED Final   Proteus species NOT DETECTED NOT DETECTED Final   Serratia marcescens NOT DETECTED NOT DETECTED Final   Haemophilus influenzae NOT DETECTED NOT DETECTED Final   Neisseria meningitidis NOT DETECTED NOT DETECTED Final   Pseudomonas aeruginosa NOT DETECTED NOT DETECTED Final   Candida albicans NOT DETECTED NOT DETECTED Final   Candida glabrata NOT DETECTED NOT DETECTED Final   Candida krusei NOT DETECTED NOT DETECTED Final   Candida parapsilosis NOT DETECTED NOT DETECTED Final   Candida tropicalis NOT DETECTED NOT DETECTED Final  Culture, blood (Routine X 2) w Reflex to ID Panel     Status: Abnormal   Collection Time: 11/07/16  7:08 PM  Result Value Ref Range Status   Specimen Description BLOOD RIGHT HAND  Final   Special Requests BOTTLES DRAWN AEROBIC ONLY 5CC  Final   Culture  Setup Time   Final    GRAM POSITIVE COCCI IN CLUSTERS AEROBIC BOTTLE ONLY CRITICAL VALUE NOTED.  VALUE IS CONSISTENT WITH PREVIOUSLY REPORTED AND CALLED VALUE.    Culture (A)  Final    STAPHYLOCOCCUS AUREUS SUSCEPTIBILITIES PERFORMED ON PREVIOUS CULTURE WITHIN THE LAST 5 DAYS.    Report Status 11/11/2016 FINAL  Final  Culture, blood (Routine X 2) w Reflex to ID Panel     Status: None   Collection Time: 11/09/16  6:05 PM  Result Value Ref Range Status   Specimen Description BLOOD RIGHT HAND  Final   Special Requests BOTTLES DRAWN  AEROBIC ONLY 6CC  Final   Culture NO GROWTH 5 DAYS  Final   Report Status 11/14/2016 FINAL  Final  Cath Tip Culture     Status: None   Collection Time: 11/11/16  2:58 PM  Result Value Ref Range Status   Specimen Description CATH TIP  Final   Special Requests NONE  Final   Culture NO GROWTH 3 DAYS  Final   Report Status 11/15/2016 FINAL  Final     Scheduled Meds: . amiodarone  200 mg Per Tube Daily  . atorvastatin  20 mg Per Tube q1800  .  ceFAZolin (ANCEF) IV  1 g Intravenous Q24H  . chlorhexidine gluconate (MEDLINE KIT)  15 mL Mouth Rinse BID  . clonazePAM  0.5 mg Per Tube BID  . clopidogrel  75 mg Per Tube Daily  . darbepoetin (ARANESP) injection - DIALYSIS  60 mcg Intravenous Q Sat-HD  . docusate  100 mg Per Tube BID  . ferric gluconate (FERRLECIT/NULECIT) IV  125 mg Intravenous Q T,Th,Sa-HD  . guaiFENesin-codeine  5 mL Per Tube Q6H  . insulin aspart  0-9 Units Subcutaneous Q4H  . mouth rinse  15 mL Mouth Rinse QID  . metoCLOPramide  10 mg Per Tube BID  . midodrine  10 mg Per Tube q morning - 10a  . multivitamin  1 tablet Oral QHS  . neomycin-bacitracin-polymyxin  1 application Topical Daily  . pantoprazole sodium  40 mg Per Tube Daily  . potassium chloride  40 mEq Oral Once  . rifampin  300 mg Per Tube Q8H  . sennosides  5 mL Per Tube BID  . sodium chloride flush  3 mL Intravenous Q12H  . Warfarin - Pharmacist Dosing Inpatient   Does not apply q1800   Continuous Infusions: . sodium chloride 10 mL/hr at 11/14/16 2000  . feeding supplement (VITAL AF 1.2 CAL) 1,000 mL (11/16/16 0607)     LOS: 14 days     [facility] Office  [phone] Pager - Text Page per Shea Evans as per below:  On-Call/Text Page:      [email or web address]      password [email or web address]  If 7PM-7AM, please contact night-coverage [email or web address] Password [email or web address] 11/16/2016, 7:52 AM

## 2016-11-16 NOTE — Progress Notes (Signed)
    [facility] has been requested to perform a transesophageal echocardiogram on Deborah [name] for SBE.  After careful review of history and examination, the risks and benefits of transesophageal echocardiogram have been explained including risks of esophageal damage, perforation (1:10,000 risk), bleeding, pharyngeal hematoma as well as other potential complications associated with conscious sedation including aspiration, arrhythmia, respiratory failure and death. Alternatives to treatment were discussed, questions were answered. Patient is NOT willing to proceed. Her son was present for the discussion as well.  He will talk with his mother and notify the primary team if she changes her mind.  Murray Hodgkins, NP  11/16/2016 6:29 PM

## 2016-11-16 NOTE — NC FL2 (Signed)
[place] LEVEL OF CARE SCREENING TOOL     IDENTIFICATION  Patient Name: Deborah [name] Birthdate: [birth year] Sex: female Admission Date (Current Location): 11/02/2016  [facility] and [place] Number:  Engineering geologist and Address:  [facility] [place], [place] [address] [place], [place] [address]      Provider Number: [id number]  Attending Physician Name and Address:  Reyne Dumas, MD  Relative Name and Phone Number:       Current Level of Care: Hospital Recommended Level of Care: Mutual Prior Approval Number:    Date Approved/Denied:   PASRR Number:    Discharge Plan: SNF    Current Diagnoses: Patient Active Problem List   Diagnosis Date Noted  . Advance care planning   . Vocal cord dysfunction   . Palliative care by specialist   . Goals of care, counseling/discussion   . Bacteremia   . End-stage renal disease on hemodialysis ([place])   . Paroxysmal atrial fibrillation ([name])   . Heart attack   . Uncontrolled type 2 diabetes mellitus with complication, without long-term current use of insulin ([place])   . Staphylococcus aureus bacteremia 11/12/2016  . Vocal cord paralysis   . Respiratory distress 11/02/2016  . Chronic anticoagulation   . Acute lower UTI   . Labile blood glucose   . Leukocytosis   . Subtherapeutic international normalized ratio (INR)   . Supratherapeutic INR   . Cough   . Oropharyngeal dysphagia   . Uncontrolled type 2 diabetes mellitus with complication, with long-term current use of insulin ([place])   . Physical debility 10/11/2016  . Metabolic encephalopathy 26/20/3559  . Debility   . ESRD on dialysis ([place])   . Type 2 diabetes mellitus with complication, with long-term current use of insulin ([place])   . Anemia of chronic disease   . Coronary artery disease involving native coronary artery of native heart without angina pectoris   . Acute on chronic respiratory failure with hypoxia ([place])   . Atrial  flutter ([place])   . Persistent cough   . Dysphonia   . Sleep disturbance   . Diabetes mellitus type 2 in nonobese ([name])   . Stage 3 chronic kidney disease   . NSTEMI (non-ST elevated myocardial infarction) ([place])   . Tachypnea   . Hyponatremia   . Lymphocytosis   . Acute blood loss anemia   . Pressure injury of skin 09/18/2016  . Tracheostomy status ([place])   . Delirium   . Encounter for attention to tracheostomy ([place])   . History of MVR with cardiopulmonary bypass   . Systolic congestive heart failure ([place])   . HCAP (healthcare-associated pneumonia)   . PAF (paroxysmal atrial fibrillation) ([place])   . Acute systolic congestive heart failure ([place])   . AKI (acute kidney injury) ([place])   . Abnormal LFTs   . S/P MVR (mitral valve replacement)   . Mitral regurgitation 08/23/2016  . Acute on chronic diastolic heart failure ([place]) 08/18/2016  . Acute pulmonary edema ([name])   . Respiratory failure ([place]) 08/17/2016  . Cardiogenic shock ([place]) 08/17/2016  . Acute hypoxemic respiratory failure ([place])   . STEMI (ST elevation myocardial infarction) ([place]) 08/15/2016  . Noncompliance 01/25/2015  . Bereavement 07/09/2014  . Low back pain 02/17/2014  . Cervical cancer screening 11/20/2013  . Routine general medical examination at a health care facility 10/14/2013  . Chronic kidney disease, stage 3 06/14/2012  . Hypertension 03/08/2012  . Diabetes mellitus type 2,  controlled 03/08/2012  . Hyperlipidemia 03/08/2012    Orientation RESPIRATION BLADDER Height & Weight     Self, Time, Situation, Place  Vent, Tracheostomy, O2 (56m shiley cuffed, FiO2 40%) Incontinent Weight: 125 lb 3.5 oz (56.8 kg) Height:  5' 2"  (157.5 cm)  BEHAVIORAL SYMPTOMS/MOOD NEUROLOGICAL BOWEL NUTRITION STATUS      Incontinent (gastrostomy) Feeding tube  AMBULATORY STATUS COMMUNICATION OF NEEDS Skin   Extensive Assist Non-Verbally PU Stage and Appropriate Care   PU Stage 2 Dressing:  (sacrum)                   Personal  Care Assistance Level of Assistance  Bathing, Dressing Bathing Assistance: Maximum assistance   Dressing Assistance: Maximum assistance     Functional Limitations Info  Speech     Speech Info: Impaired    SPECIAL CARE FACTORS FREQUENCY  PT (By licensed PT), OT (By licensed OT)     PT Frequency: 5/wk OT Frequency: 5/wk            Contractures      Additional Factors Info  Code Status, Allergies, Psychotropic, Insulin Sliding Scale, Isolation Precautions Code Status Info: FULL Allergies Info: NKA Psychotropic Info: zoloft Insulin Sliding Scale Info: 6/day Isolation Precautions Info: MRSA     Current Medications (11/16/2016):  This is the current hospital active medication list Current Facility-Administered Medications  Medication Dose Route Frequency Provider Last Rate Last Dose  . 0.9 %  sodium chloride infusion   Intravenous Continuous VChesley Mires MD 10 mL/hr at 11/14/16 2000    . amiodarone (PACERONE) tablet 200 mg  200 mg Per Tube Daily JJavier Glazier MD   200 mg at 11/16/16 1004  . atorvastatin (LIPITOR) tablet 20 mg  20 mg Per Tube q1800 JJavier Glazier MD   20 mg at 11/15/16 1718  . ceFAZolin (ANCEF) IVPB 1 g/50 mL premix  1 g Intravenous Q24H JJavier Glazier MD   1 g at 11/15/16 1721  . chlorhexidine gluconate (MEDLINE KIT) (PERIDEX) 0.12 % solution 15 mL  15 mL Mouth Rinse BID PThurnell Lose MD   15 mL at 11/16/16 0758  . clonazePAM (KLONOPIN) tablet 0.5 mg  0.5 mg Per Tube BID JJavier Glazier MD   0.5 mg at 11/16/16 1003  . clopidogrel (PLAVIX) tablet 75 mg  75 mg Per Tube Daily JJavier Glazier MD   75 mg at 11/16/16 1003  . [START ON 11/18/2016] Darbepoetin Alfa (ARANESP) injection 100 mcg  100 mcg Intravenous Q Sat-HD KLoren Racer PA-C      . docusate (COLACE) 50 MG/5ML liquid 100 mg  100 mg Per Tube BID JJavier Glazier MD   100 mg at 11/14/16 2114  . feeding supplement (VITAL AF 1.2 CAL) liquid 1,000 mL  1,000 mL Per Tube  Continuous JCherene Altes MD 50 mL/hr at 11/16/16 0607 1,000 mL at 11/16/16 0607  . fentaNYL (SUBLIMAZE) injection 25-50 mcg  25-50 mcg Intravenous Q1H PRN JCherene Altes MD   50 mcg at 11/15/16 2128  . ferric gluconate (NULECIT) 125 mg in sodium chloride 0.9 % 100 mL IVPB  125 mg Intravenous Q T,Th,Sa-HD KCorliss Parish MD   125 mg at 11/16/16 1622  . fluticasone (FLONASE) 50 MCG/ACT nasal spray 2 spray  2 spray Each Nare PRN PThurnell Lose MD      . guaiFENesin-codeine 100-10 MG/5ML solution 5 mL  5 mL Per Tube Q6H JJavier Glazier MD   5  mL at 11/16/16 1350  . insulin aspart (novoLOG) injection 0-9 Units  0-9 Units Subcutaneous Q4H Cherene Altes, MD   5 Units at 11/16/16 1300  . levalbuterol (XOPENEX) nebulizer solution 0.63 mg  0.63 mg Nebulization Q6H PRN Jonetta Osgood, MD   0.63 mg at 11/06/16 1437  . MEDLINE mouth rinse  15 mL Mouth Rinse QID Thurnell Lose, MD   15 mL at 11/16/16 1200  . metoCLOPramide (REGLAN) 10 MG/10ML solution 10 mg  10 mg Per Tube BID Javier Glazier, MD   10 mg at 11/16/16 1004  . midodrine (PROAMATINE) tablet 10 mg  10 mg Per Tube q morning - 10a Javier Glazier, MD   10 mg at 11/16/16 1003  . multivitamin (RENA-VIT) tablet 1 tablet  1 tablet Oral QHS Corey Harold, NP   1 tablet at 11/15/16 2128  . neomycin-bacitracin-polymyxin (NEOSPORIN) ointment 1 application  1 application Topical Daily Corrie Mckusick, DO   1 application at 35/57/32 1353  . ondansetron (ZOFRAN) injection 4 mg  4 mg Intravenous Q6H PRN Javier Glazier, MD   4 mg at 11/14/16 2332  . pantoprazole sodium (PROTONIX) 40 mg/20 mL oral suspension 40 mg  40 mg Per Tube Daily Thurnell Lose, MD   40 mg at 11/16/16 1003  . prochlorperazine (COMPAZINE) injection 10 mg  10 mg Intravenous Q4H PRN Anders Simmonds, MD   10 mg at 11/12/16 0703  . rifampin (RIFADIN) 60 mg/mL oral suspension 300 mg  300 mg Per Tube Q8H Javier Glazier, MD   300 mg at 11/16/16 0551  . sennosides  (SENOKOT) 8.8 MG/5ML syrup 5 mL  5 mL Per Tube BID Javier Glazier, MD   5 mL at 11/14/16 2114  . sodium chloride flush (NS) 0.9 % injection 3 mL  3 mL Intravenous Q12H Corey Harold, NP   3 mL at 11/16/16 1004  . warfarin (COUMADIN) tablet 2 mg  2 mg Oral ONCE-1800 Honor Loh, [facility]      . Warfarin - Pharmacist Dosing Inpatient   Does not apply q1800 Cherene Altes, MD   1 each at 11/15/16 1800     Discharge Medications: Please see discharge summary for a list of discharge medications.  Relevant Imaging Results:  Relevant Lab Results:   Additional Information SS#: [id number], patient will need in house dialysis  Jorge Ny, LCSW

## 2016-11-16 NOTE — Progress Notes (Signed)
ANTICOAGULATION CONSULT NOTE - Follow Up Consult  Pharmacy Consult for Heparin & Warfarin  Indication: atrial fibrillation and bioprosthetic MVR  No Known Allergies  Patient Measurements: Height: 5\' 2"  (157.5 cm) Weight: 123 lb 7.3 oz (56 kg) IBW/kg (Calculated) : 50.1  Vital Signs: Temp: 98 F (36.7 C) (12/14 0353) Temp Source: Oral (12/14 0353) BP: 107/60 (12/14 0600) Pulse Rate: 90 (12/14 0600)  Labs:  Recent Labs  11/14/16 0523 11/14/16 1149 11/15/16 0450 11/15/16 0500 11/16/16 0500  HGB 10.0*  --   --  10.0* 9.7*  HCT 31.9*  --   --  31.4* 31.3*  PLT 329  --   --  350 354  LABPROT 21.8*  --   --  24.4* 30.8*  INR 1.87  --   --  2.16 2.88  HEPARINUNFRC 0.36 0.57 0.59  --  0.47  CREATININE 5.76*  --   --  2.83* 4.10*    Estimated Creatinine Clearance: 11.7 mL/min (by C-G formula based on SCr of 4.1 mg/dL (H)).   Assessment: 60 yo M with h/o atrial fibrillation, PTA warfarin was on hold. Pharmacy originally consulted for heparin gtt. Restarted wafarin per pharmacy on 12/13 now that G-tube placed. PTA warfarin dose was 2mg  daily except for 4mg  on MWF. Had not had warfarin over a week but INR noted to be trending up. No known liver dysfunction. Also on rifampin which may decrease the effectiveness of warfarin. Given one 4mg  dose of warfarin on 12/13 and INR increased from 2.16 to 2.88. CBC low, but stable. No overt s/s bleeding.   Goal of Therapy:  INR 2.0-3.0   Plan:  D/C heparin  Give Coumadin 2mg  PO x 1 tonight Monitor daily INR, CBC q72h, s/s of bleed  Argie Ramming, PharmD Pharmacy Resident  Pager [phone] 11/16/16 7:46 AM

## 2016-11-17 ENCOUNTER — Encounter (HOSPITAL_COMMUNITY)
Admission: EM | Disposition: A | Discharge status: Other Acute Inpatient Hospital | Payer: Self-pay | Source: Home / Self Care | Attending: Pulmonary Disease

## 2016-11-17 DIAGNOSIS — Z515 Encounter for palliative care: Secondary | ICD-10-CM

## 2016-11-17 LAB — CBC
HCT: 31.2 % — ABNORMAL LOW (ref 36.0–46.0)
Hemoglobin: 9.8 g/dL — ABNORMAL LOW (ref 12.0–15.0)
MCH: 27.3 pg (ref 26.0–34.0)
MCHC: 31.4 g/dL (ref 30.0–36.0)
MCV: 86.9 fL (ref 78.0–100.0)
PLATELETS: 344 10*3/uL (ref 150–400)
RBC: 3.59 MIL/uL — ABNORMAL LOW (ref 3.87–5.11)
RDW: 19 % — AB (ref 11.5–15.5)
WBC: 17.7 10*3/uL — AB (ref 4.0–10.5)

## 2016-11-17 LAB — PROTIME-INR
INR: 3.97
PROTHROMBIN TIME: 39.8 s — AB (ref 11.4–15.2)

## 2016-11-17 LAB — COMPREHENSIVE METABOLIC PANEL
AST: 18 U/L (ref 15–41)
Albumin: 1.9 g/dL — ABNORMAL LOW (ref 3.5–5.0)
Alkaline Phosphatase: 73 U/L (ref 38–126)
Anion gap: 11 (ref 5–15)
BUN: 11 mg/dL (ref 6–20)
CHLORIDE: 95 mmol/L — AB (ref 101–111)
CO2: 27 mmol/L (ref 22–32)
CREATININE: 3.64 mg/dL — AB (ref 0.44–1.00)
Calcium: 8.2 mg/dL — ABNORMAL LOW (ref 8.9–10.3)
GFR calc Af Amer: 15 mL/min — ABNORMAL LOW (ref >60)
GFR, EST NON AFRICAN AMERICAN: 13 mL/min — AB (ref >60)
GLUCOSE: 290 mg/dL — AB (ref 65–99)
Potassium: 4.4 mmol/L (ref 3.5–5.1)
Sodium: 133 mmol/L — ABNORMAL LOW (ref 135–145)
Total Bilirubin: 1 mg/dL (ref 0.3–1.2)
Total Protein: 5.6 g/dL — ABNORMAL LOW (ref 6.5–8.1)

## 2016-11-17 LAB — GLUCOSE, CAPILLARY
GLUCOSE-CAPILLARY: 233 mg/dL — AB (ref 65–99)
GLUCOSE-CAPILLARY: 213 mg/dL — AB (ref 65–99)
Glucose-Capillary: 250 mg/dL — ABNORMAL HIGH (ref 65–99)
Glucose-Capillary: 263 mg/dL — ABNORMAL HIGH (ref 65–99)
Glucose-Capillary: 287 mg/dL — ABNORMAL HIGH (ref 65–99)
Glucose-Capillary: 334 mg/dL — ABNORMAL HIGH (ref 65–99)

## 2016-11-17 SURGERY — ECHOCARDIOGRAM, TRANSESOPHAGEAL
Anesthesia: Moderate Sedation

## 2016-11-17 NOTE — Progress Notes (Addendum)
INFECTIOUS DISEASE PROGRESS NOTE  ID: Deborah [name] is a 60 y.o. female with  Principal Problem:   Staphylococcus aureus bacteremia Active Problems:   Mitral regurgitation   S/P MVR (mitral valve replacement)   HCAP (healthcare-associated pneumonia)   Diabetes mellitus type 2 in nonobese ([name])   ESRD on dialysis ([place])   Anemia of chronic disease   Atrial flutter ([name])   Respiratory distress   Vocal cord paralysis   Bacteremia   End-stage renal disease on hemodialysis ([name])   Paroxysmal atrial fibrillation ([place])   Heart attack   Uncontrolled type 2 diabetes mellitus with complication, without long-term current use of insulin ([place])   Advance care planning   Vocal cord dysfunction   Palliative care by specialist   Goals of care, counseling/discussion  Subjective: No reponse  Abtx:  Anti-infectives    Start     Dose/Rate Route Frequency Ordered Stop   11/13/16 1410  ceFAZolin (ANCEF) 2-4 GM/100ML-% IVPB    Comments:  Shaaron Adler   : cabinet override      11/13/16 1410 11/13/16 1432   11/11/16 1800  ceFAZolin (ANCEF) IVPB 1 g/50 mL premix     1 g 100 mL/hr over 30 Minutes Intravenous Every 24 hours 11/11/16 1425     11/10/16 1400  rifampin (RIFADIN) 60 mg/mL oral suspension 300 mg     300 mg Per Tube Every 8 hours 11/10/16 1029     11/10/16 0815  vancomycin (VANCOCIN) 500 mg in sodium chloride 0.9 % 100 mL IVPB     500 mg 100 mL/hr over 60 Minutes Intravenous  Once 11/10/16 0802 11/10/16 1143   11/09/16 1830  rifampin (RIFADIN) 60 mg/mL oral suspension 300 mg  Status:  Discontinued     300 mg Oral Every 8 hours 11/09/16 1711 11/10/16 1029   11/09/16 1200  vancomycin (VANCOCIN) 500 mg in sodium chloride 0.9 % 100 mL IVPB  Status:  Discontinued     500 mg 100 mL/hr over 60 Minutes Intravenous Every T-Th-Sa (Hemodialysis) 11/08/16 0919 11/11/16 1424   11/08/16 1000  ceFEPIme (MAXIPIME) 1 g in dextrose 5 % 50 mL IVPB  Status:  Discontinued     1 g 100 mL/hr over 30  Minutes Intravenous Every 24 hours 11/08/16 0919 11/11/16 1424   11/08/16 0930  vancomycin (VANCOCIN) 1,250 mg in sodium chloride 0.9 % 250 mL IVPB     1,250 mg 166.7 mL/hr over 90 Minutes Intravenous  Once 11/08/16 0919 11/08/16 1415      Medications:  Scheduled: . amiodarone  200 mg Per Tube Daily  . atorvastatin  20 mg Per Tube q1800  .  ceFAZolin (ANCEF) IV  1 g Intravenous Q24H  . chlorhexidine gluconate (MEDLINE KIT)  15 mL Mouth Rinse BID  . clonazePAM  0.5 mg Per Tube BID  . clopidogrel  75 mg Per Tube Daily  . [START ON 11/18/2016] darbepoetin (ARANESP) injection - DIALYSIS  100 mcg Intravenous Q Sat-HD  . docusate  100 mg Per Tube BID  . ferric gluconate (FERRLECIT/NULECIT) IV  125 mg Intravenous Q T,Th,Sa-HD  . guaiFENesin-codeine  5 mL Per Tube Q6H  . insulin aspart  0-9 Units Subcutaneous Q4H  . mouth rinse  15 mL Mouth Rinse QID  . metoCLOPramide  10 mg Per Tube BID  . midodrine  10 mg Per Tube q morning - 10a  . multivitamin  1 tablet Oral QHS  . neomycin-bacitracin-polymyxin  1 application Topical Daily  . pantoprazole sodium  40 mg Per Tube Daily  . rifampin  300 mg Per Tube Q8H  . sennosides  5 mL Per Tube BID  . sodium chloride flush  3 mL Intravenous Q12H  . Warfarin - Pharmacist Dosing Inpatient   Does not apply q1800    Objective: Vital signs in last 24 hours: Temp:  [97.9 F (36.6 C)-99.1 F (37.3 C)] 98.5 F (36.9 C) (12/15 1152) Pulse Rate:  [77-101] 101 (12/15 1614) Resp:  [16-26] 22 (12/15 1614) BP: (84-104)/(51-67) 101/64 (12/15 1152) SpO2:  [100 %] 100 % (12/15 1614) FiO2 (%):  [40 %] 40 % (12/15 1614) Weight:  [56.8 kg (125 lb 3.5 oz)] 56.8 kg (125 lb 3.5 oz) (12/15 0400)   General appearance: no distress Resp: clear to auscultation bilaterally Cardio: regular rate and rhythm GI: normal findings: bowel sounds normal and soft, non-tender  Lab Results  Recent Labs  11/16/16 0500 11/17/16 0945  WBC 15.2* 17.7*  HGB 9.7* 9.8*  HCT  31.3* 31.2*  NA 134* 133*  K 3.2* 4.4  CL 95* 95*  CO2 27 27  BUN 13 11  CREATININE 4.10* 3.64*   Liver Panel  Recent Labs  11/16/16 0500 11/17/16 0945  PROT  --  5.6*  ALBUMIN 1.8* 1.9*  AST  --  18  ALT  --  <5*  ALKPHOS  --  73  BILITOT  --  1.0   Sedimentation Rate No results for input(s): ESRSEDRATE in the last 72 hours. C-Reactive Protein No results for input(s): CRP in the last 72 hours.  Microbiology: Recent Results (from the past 240 hour(s))  Culture, blood (Routine X 2) w Reflex to ID Panel     Status: Abnormal   Collection Time: 11/07/16  7:02 PM  Result Value Ref Range Status   Specimen Description BLOOD RIGHT ANTECUBITAL  Final   Special Requests IN PEDIATRIC BOTTLE 2CC  Final   Culture  Setup Time   Final    AEROBIC BOTTLE ONLY GRAM POSITIVE COCCI Organism ID to follow CRITICAL RESULT CALLED TO, READ BACK BY AND VERIFIED WITH: Narda Bonds, PHARMD @2343  11/08/16 MKELLY,MLT    Culture STAPHYLOCOCCUS AUREUS (A)  Final   Report Status 11/11/2016 FINAL  Final   Organism ID, Bacteria STAPHYLOCOCCUS AUREUS  Final      Susceptibility   Staphylococcus aureus - MIC*    CIPROFLOXACIN 2 INTERMEDIATE Intermediate     ERYTHROMYCIN >=8 RESISTANT Resistant     GENTAMICIN <=0.5 SENSITIVE Sensitive     OXACILLIN 0.5 SENSITIVE Sensitive     TETRACYCLINE <=1 SENSITIVE Sensitive     VANCOMYCIN <=0.5 SENSITIVE Sensitive     TRIMETH/SULFA <=10 SENSITIVE Sensitive     CLINDAMYCIN <=0.25 RESISTANT Resistant     RIFAMPIN <=0.5 SENSITIVE Sensitive     Inducible Clindamycin POSITIVE Resistant     * STAPHYLOCOCCUS AUREUS  Blood Culture ID Panel (Reflexed)     Status: Abnormal   Collection Time: 11/07/16  7:02 PM  Result Value Ref Range Status   Enterococcus species NOT DETECTED NOT DETECTED Final   Listeria monocytogenes NOT DETECTED NOT DETECTED Final   Staphylococcus species DETECTED (A) NOT DETECTED Final    Comment: CRITICAL RESULT CALLED TO, READ BACK BY AND  VERIFIED WITH: JAMES LEDFORD, PHARMD @2343  11/08/16 MKELLY,MLT    Staphylococcus aureus DETECTED (A) NOT DETECTED Final    Comment: CRITICAL RESULT CALLED TO, READ BACK BY AND VERIFIED WITH: JAMES LEDFORD, PHARMD @2343  11/08/16 MKELLY,MLT    Methicillin resistance NOT DETECTED NOT  DETECTED Final   Streptococcus species NOT DETECTED NOT DETECTED Final   Streptococcus agalactiae NOT DETECTED NOT DETECTED Final   Streptococcus pneumoniae NOT DETECTED NOT DETECTED Final   Streptococcus pyogenes NOT DETECTED NOT DETECTED Final   Acinetobacter baumannii NOT DETECTED NOT DETECTED Final   Enterobacteriaceae species NOT DETECTED NOT DETECTED Final   Enterobacter cloacae complex NOT DETECTED NOT DETECTED Final   Escherichia coli NOT DETECTED NOT DETECTED Final   Klebsiella oxytoca NOT DETECTED NOT DETECTED Final   Klebsiella pneumoniae NOT DETECTED NOT DETECTED Final   Proteus species NOT DETECTED NOT DETECTED Final   Serratia marcescens NOT DETECTED NOT DETECTED Final   Haemophilus influenzae NOT DETECTED NOT DETECTED Final   Neisseria meningitidis NOT DETECTED NOT DETECTED Final   Pseudomonas aeruginosa NOT DETECTED NOT DETECTED Final   Candida albicans NOT DETECTED NOT DETECTED Final   Candida glabrata NOT DETECTED NOT DETECTED Final   Candida krusei NOT DETECTED NOT DETECTED Final   Candida parapsilosis NOT DETECTED NOT DETECTED Final   Candida tropicalis NOT DETECTED NOT DETECTED Final  Culture, blood (Routine X 2) w Reflex to ID Panel     Status: Abnormal   Collection Time: 11/07/16  7:08 PM  Result Value Ref Range Status   Specimen Description BLOOD RIGHT HAND  Final   Special Requests BOTTLES DRAWN AEROBIC ONLY 5CC  Final   Culture  Setup Time   Final    GRAM POSITIVE COCCI IN CLUSTERS AEROBIC BOTTLE ONLY CRITICAL VALUE NOTED.  VALUE IS CONSISTENT WITH PREVIOUSLY REPORTED AND CALLED VALUE.    Culture (A)  Final    STAPHYLOCOCCUS AUREUS SUSCEPTIBILITIES PERFORMED ON PREVIOUS  CULTURE WITHIN THE LAST 5 DAYS.    Report Status 11/11/2016 FINAL  Final  Culture, blood (Routine X 2) w Reflex to ID Panel     Status: None   Collection Time: 11/09/16  6:05 PM  Result Value Ref Range Status   Specimen Description BLOOD RIGHT HAND  Final   Special Requests BOTTLES DRAWN AEROBIC ONLY [place]  Final   Culture NO GROWTH 5 DAYS  Final   Report Status 11/14/2016 FINAL  Final  Cath Tip Culture     Status: None   Collection Time: 11/11/16  2:58 PM  Result Value Ref Range Status   Specimen Description CATH TIP  Final   Special Requests NONE  Final   Culture NO GROWTH 3 DAYS  Final   Report Status 11/15/2016 FINAL  Final    Studies/Results: No results found.   Assessment/Plan: MSSAbacteremia HCAP Steroid induced leukocytosis Prev CV surgery (MV bioprosthesis) Vocal cord paralysis, trach redo 12-7 PEG 12-11  WBCs slightly up today Repeat BCx 12-7 negative Continuerifampin (prev LFTs normal) HD line out on 12-7, new line in R chest.  Tried to answer daughters multiple questions.  Refused TEE. Would give her 6 weeks of anbx.  plese repeat anbx 2 weeks after end of therapy Available as needed.   Total days of antibiotics: 10(ancef/rifampin)         Bobby Rumpf Infectious Diseases (pager) [phone] [email or web address] 11/17/2016, 4:37 PM  LOS: 15 days

## 2016-11-17 NOTE — Evaluation (Signed)
[facility] Speaking [facility]ve - Evaluation Patient Details  Name: Deborah [name] MRN: [id number] Date of Birth: 1956-02-23  Today's Date: 11/17/2016 Time: [id number] SLP Time Calculation (min) (ACUTE ONLY): 23 min  Past Medical History:  Past Medical History:  Diagnosis Date  . Diabetes mellitus   . Hyperlipidemia   . Hypertension   . MI, old   . Renal disorder    dialysls   Past Surgical History:  Past Surgical History:  Procedure Laterality Date  . AV FISTULA PLACEMENT Left 10/02/2016   Procedure: INSERTION OF ARTERIOVENOUS (AV) GORE-TEX GRAFT ARM;  Surgeon: Waynetta Sandy, MD;  Location: [place];  Service: Vascular;  Laterality: Left;  . CARDIAC CATHETERIZATION N/A 08/15/2016   Procedure: Left Heart Cath and Coronary Angiography;  Surgeon: Yolonda Kida, MD;  Location: [place] [facility];  Service: Cardiovascular;  Laterality: N/A;  . CARDIAC CATHETERIZATION N/A 08/15/2016   Procedure: Coronary Stent Intervention;  Surgeon: Yolonda Kida, MD;  Location: [place] [facility];  Service: Cardiovascular;  Laterality: N/A;  . CARDIAC CATHETERIZATION N/A 08/18/2016   Procedure: Right Heart Cath;  Surgeon: Jolaine Artist, MD;  Location: [place] [facility];  Service: Cardiovascular;  Laterality: N/A;  . CARDIAC CATHETERIZATION N/A 08/18/2016   Procedure: IABP Insertion;  Surgeon: Jolaine Artist, MD;  Location: [place] [facility];  Service: Cardiovascular;  Laterality: N/A;  . CARDIAC CATHETERIZATION Right 08/23/2016   Procedure: CENTRAL LINE INSERTION RIGHT SUBCLAVIAN;  Surgeon: Ivin Poot, MD;  Location: [place];  Service: Open Heart Surgery;  Laterality: Right;  . ENDOVEIN HARVEST OF GREATER SAPHENOUS VEIN Left 08/23/2016   Procedure: ENDOVEIN HARVEST OF GREATER SAPHENOUS VEIN;  Surgeon: Ivin Poot, MD;  Location: [place];  Service: Open Heart Surgery;  Laterality: Left;  . INSERTION OF DIALYSIS CATHETER Bilateral 08/31/2016   Procedure: INSERTION OF DIALYSIS  CATHETER LEFT INTERNAL JUGULAR VEIN & INSERTION OF TRIPLE LUMEN RIGHT INTERNAL JUGULAR VEIN;  Surgeon: Angelia Mould, MD;  Location: [place];  Service: Vascular;  Laterality: Bilateral;  . INTRAOPERATIVE TRANSESOPHAGEAL ECHOCARDIOGRAM N/A 08/23/2016   Procedure: INTRAOPERATIVE TRANSESOPHAGEAL ECHOCARDIOGRAM;  Surgeon: Ivin Poot, MD;  Location: [place];  Service: Open Heart Surgery;  Laterality: N/A;  . IR GENERIC HISTORICAL  11/13/2016   IR US GUIDE VASC ACCESS LEFT 11/13/2016 Corrie Mckusick, DO [facility]  . IR GENERIC HISTORICAL  11/13/2016   IR FLUORO GUIDE CV LINE LEFT 11/13/2016 Corrie Mckusick, DO [facility]  . IR GENERIC HISTORICAL  11/13/2016   IR GASTROSTOMY TUBE MOD SED 11/13/2016 Corrie Mckusick, DO [facility]  . MITRAL VALVE REPAIR N/A 08/23/2016   Procedure: MITRAL VALVE REPAIR (MVR) USING 25MM EDWARDS MAGNA EASE BIOPROSTHESIS MITRAL  VALVE;  Surgeon: Ivin Poot, MD;  Location: [place];  Service: Open Heart Surgery;  Laterality: N/A;  . tracheostomy reversal    . TRACHEOSTOMY TUBE PLACEMENT N/A 11/09/2016   Procedure: TRACHEOSTOMY;  Surgeon: Melissa Montane, MD;  Location: [place];  Service: ENT;  Laterality: N/A;  . VAGINAL DELIVERY     x 6   HPI:  Pt is a 60 y/o female recently discharged 11/29 from [facility] and presented to [facility] on 11/30 secondary to dyspnea. PMH including but not limited to A-fib/flutter, DM, ESRD, and CAD s/p STEMI in September (08/15/16). Pt was intubated on 9/30 with trach placement on 10/5. She progressed to weaning off of vent on 10/09 and decannulated on 10/25. She has since had to have a second trach placement  this admission on 11/09/16.   Assessment / Plan / Recommendation Clinical Impression  Pt placed on trach collar by RT and SLP initiated PMSV assessment after period of 10 min to adjust from ventilator. Valve popping off initially with each attempt to verbalize due to exhaled air coming through trach versus diverted to upper airway. Towards end of  session pt vocalizing with breathy quality and decreased vocal intensity. Demonstrated use of strategies following instruction/demonstration with mod cues needed. HR an SpO2 stable. RR ranged 18-33. Daughters present and educated throughout assessment. Pt smiling and happy with hearing her voice. Recommend pt wear with SLP only with full supervision. Will follow up next week.      SLP Assessment  Patient needs continued Speech Lanaguage Pathology Services    Follow Up Recommendations  Inpatient Rehab    Frequency and Duration min 2x/week  2 weeks    PMSV Trial PMSV was placed for:  (intervals with total time 18 min) Able to redirect subglottic air through upper airway: Yes Able to Attain Phonation: Yes Voice Quality: Breathy;Low vocal intensity Able to Expectorate Secretions: Yes Level of Secretion Expectoration with PMSV: Tracheal Breath Support for Phonation: Moderately decreased Intelligibility: Intelligibility reduced Word: 50-74% accurate Phrase: 25-49% accurate Respirations During Trial:  (18-33) SpO2 During Trial: 100 % Pulse During Trial: 103 Behavior: Alert;Controlled;Cooperative;Expresses self well;Responsive to questions   Tracheostomy Tube       Vent Dependency  Vent Mode: PRVC Set Rate: 18 bmp PEEP: 5 cmH20 FiO2 (%): 40 % Vt Set: 400 mL    Cuff Deflation Trial  GO Tolerated Cuff Deflation: Yes Length of Time for Cuff Deflation Trial:  (23 min) Behavior: Alert;Controlled;Cooperative;Responsive to questions        Houston Siren 11/17/2016, 4:20 PM   Orbie Pyo Colvin Caroli.Ed [employer] [phone]

## 2016-11-17 NOTE — Progress Notes (Signed)
[facility] Progress Note   Subjective:  Denies CP or new symptoms, appears tired today. Daughter in room.  Objective Vitals:   11/17/16 0824 11/17/16 0827 11/17/16 1152 11/17/16 1353  BP: (!) 92/59  101/64   Pulse: 88 91 95 98  Resp: 16 (!) 26 19 20   Temp: 98.4 F (36.9 C)  98.5 F (36.9 C)   TempSrc: Oral  Oral   SpO2: 100% 100% 100% 100%  Weight:      Height:       Physical Exam General: Awake; with trach/on vent. NAD Heart: RRR; no murmur Lungs: Clear anteriorly; ventilated Abdomen: soft, non-tender Extremities: No LE edema Dialysis Access: LUE AVG + thrill  Additional Objective Labs: Basic Metabolic Panel:  Recent Labs Lab 11/14/16 0523  11/15/16 0500 11/16/16 0500 11/17/16 0945  NA 135  --  136 134* 133*  K 3.1*  < > 3.6 3.2* 4.4  CL 96*  --  98* 95* 95*  CO2 25  --  27 27 27   GLUCOSE 115*  --  68 147* 290*  BUN 32*  --  8 13 11   CREATININE 5.76*  --  2.83* 4.10* 3.64*  CALCIUM 8.5*  --  8.0* 7.8* 8.2*  PHOS 2.1*  --  1.2* 3.4  --   < > = values in this interval not displayed. Liver Function Tests:  Recent Labs Lab 11/15/16 0500 11/16/16 0500 11/17/16 0945  AST  --   --  18  ALT  --   --  <5*  ALKPHOS  --   --  73  BILITOT  --   --  1.0  PROT  --   --  5.6*  ALBUMIN 1.9* 1.8* 1.9*   CBC:  Recent Labs Lab 11/13/16 0300 11/14/16 0523 11/15/16 0500 11/16/16 0500 11/17/16 0945  WBC 15.6* 13.6* 15.6* 15.2* 17.7*  HGB 10.8* 10.0* 10.0* 9.7* 9.8*  HCT 34.0* 31.9* 31.4* 31.3* 31.2*  MCV 86.1 86.2 85.8 87.9 86.9  PLT 297 329 350 354 344   Blood Culture    Component Value Date/Time   SDES CATH TIP 11/11/2016 1458   SPECREQUEST NONE 11/11/2016 1458   CULT NO GROWTH 3 DAYS 11/11/2016 1458   REPTSTATUS 11/15/2016 FINAL 11/11/2016 1458   CBG:  Recent Labs Lab 11/16/16 2024 11/17/16 0009 11/17/16 0440 11/17/16 0820 11/17/16 1233  GLUCAP 182* 287* 334* 233* 263*   Medications: . sodium chloride 10 mL/hr at 11/16/16  2033  . feeding supplement (VITAL AF 1.2 CAL) 1,000 mL (11/16/16 2122)   . amiodarone  200 mg Per Tube Daily  . atorvastatin  20 mg Per Tube q1800  .  ceFAZolin (ANCEF) IV  1 g Intravenous Q24H  . chlorhexidine gluconate (MEDLINE KIT)  15 mL Mouth Rinse BID  . clonazePAM  0.5 mg Per Tube BID  . clopidogrel  75 mg Per Tube Daily  . [START ON 11/18/2016] darbepoetin (ARANESP) injection - DIALYSIS  100 mcg Intravenous Q Sat-HD  . docusate  100 mg Per Tube BID  . ferric gluconate (FERRLECIT/NULECIT) IV  125 mg Intravenous Q T,Th,Sa-HD  . guaiFENesin-codeine  5 mL Per Tube Q6H  . insulin aspart  0-9 Units Subcutaneous Q4H  . mouth rinse  15 mL Mouth Rinse QID  . metoCLOPramide  10 mg Per Tube BID  . midodrine  10 mg Per Tube q morning - 10a  . multivitamin  1 tablet Oral QHS  . neomycin-bacitracin-polymyxin  1 application Topical Daily  . pantoprazole  sodium  40 mg Per Tube Daily  . rifampin  300 mg Per Tube Q8H  . sennosides  5 mL Per Tube BID  . sodium chloride flush  3 mL Intravenous Q12H  . Warfarin - Pharmacist Dosing Inpatient   Does not apply q1800    Dialysis Orders: [place] [facility] on TTS schedule. 4 hours, 2K/2.5Ca bath, no EDW (only 1 OP dialysis), prior L IJ (removed), now using LUE AVG  Assessment/Plan: 1. MSSA bacteremia/HD line sepsis: BCx 12/05 grew MSSA, s/p HD cath removal, using L AVG. ID following:On Cefazolin/Rifampin. Plan for TEE soon. 2. Vocal cord paralysis, s/p repeat trach 12/7: Remains on vent/full support.  3. ESRD: Continue TTS HD, just started on HD Sept 2017. 4. Anemia: Hgb 9.8. Continue Aranesp weekly, increase dose 60 ->155mg. 5. MBD: Ca/Phos/PTH ok. No meds for now. 6. BP/Volume: Weights reasonable stable ~55-56kg. No edema on exam, run even on HD. CXR 12/8 without pulm edema. 7. Nutrition: Albumin 1.9. Tube feeds ongoing. 8. Hypophosphatemia: Better today. 9. Hypokalemia: 4K bath with HD. 10. Hx MV replacement: Per primary. 11. Atrial  fibrillation: Per primary. On amiodarone/warfarin. 12. DM: Per primary.  KVeneta Penton PA-C 11/17/2016, 4:08 PM  [facility] Pager: [phone] Pt seen, examined and agree w A/P as above.  RKelly SplinterMD CNewell Rubbermaidpager [phone]  11/17/2016, 5:39 PM

## 2016-11-17 NOTE — Progress Notes (Signed)
Inpatient Diabetes Program Recommendations  AACE/ADA: New Consensus Statement on Inpatient Glycemic Control (2015)  Target Ranges:  Prepandial:   less than 140 mg/dL      Peak postprandial:   less than 180 mg/dL (1-2 hours)      Critically ill patients:  140 - 180 mg/dL   Lab Results  Component Value Date   GLUCAP 233 (H) 11/17/2016   HGBA1C 13.3 (H) 08/15/2016    Review of Glycemic ControlResults for RONASIA, ISOLA (MRN [id number]) as of 11/17/2016 09:47  Ref. Range 11/16/2016 16:45 11/16/2016 20:24 11/17/2016 00:09 11/17/2016 04:40 11/17/2016 08:20  Glucose-Capillary Latest Ref Range: 65 - 99 mg/dL 203 (H) 182 (H) 287 (H) 334 (H) 233 (H)    Inpatient Diabetes Program Recommendations:   Blood sugars trending up with tube feeds.  Consider adding Levemir 5 units.  Thanks, Adah Perl, RN, BC-ADM Inpatient Diabetes Coordinator Pager [phone] (8a-5p)

## 2016-11-17 NOTE — Progress Notes (Addendum)
Name: Deborah [name] MRN: [id number] DOB: [birth year]    ADMISSION DATE:  11/02/2016 CONSULTATION DATE:  11/02/2016  REFERRING MD :  Dr. Regenia Skeeter EDP  CHIEF COMPLAINT:  SOB  BRIEF PATIENT DESCRIPTION: 60 year old female ESRD 10/3/0/17 with recent prolonged hospitalization secondary to papillary muscle rupture and subsequent HCAP. She was intubated requiring trach. Decannulated in the end of October. She then went to [facility] and was discharged 11/29. No presenting to [facility] ED 11/30 with compalints of SOB x1-2 weeks. She was seen by ENT they found cords were not opening from either scarring, inflammation, or nerve issue. They were hoping she would improve in the coming days, however, she did not and was taken for tracheostomy 12/7.  SIGNIFICANT EVENTS  09/12 Admit to [facility] 09/15 To [facility] 09/20 MVR with bioprosthetic valve 09/28 Permcath for HD 09/29 Extubated 09/30 Reintubated 10/05 Trach by Hyman Bible 10/09 Off vent 10/25 decannulated 11/8 to CIR 11/29 DC CIR 11/30 admit for resp distress 127 - redo trach Tracheostomy 6.4 cuffed [name]) 12/7 >> 12/09 L IJ Tunneled HD catheter removed . LUE graft being used 12/10 -  Patient did have some mild emesis overnight. Denies any abdominal pain or nausea at this time. Denies any chest pain or difficulty breathing at this time.  SUBJECTIVE/OVERNIGHT/INTERVAL HX Patient currently tolerating wean since 0900.   VITAL SIGNS: Temp:  [97.9 F (36.6 C)-99.1 F (37.3 C)] 98.5 F (36.9 C) (12/15 1152) Pulse Rate:  [77-95] 95 (12/15 1152) Resp:  [16-26] 19 (12/15 1152) BP: (84-104)/(51-67) 101/64 (12/15 1152) SpO2:  [100 %] 100 % (12/15 1152) FiO2 (%):  [40 %] 40 % (12/15 1152) Weight:  [56.8 kg (125 lb 3.5 oz)] 56.8 kg (125 lb 3.5 oz) (12/15 0400)  PHYSICAL EXAMINATION: General:  Elderly female on vent  Neuro:  Alert, follows commands HEENT:  Normocephalic, Trach in place   Cardiovascular: RRR, no MRG, NI s1/s2  Pulmonary: Unlabored, Diminished as bases.  Tolerating pressure support wean today. Abdomen:  Soft. Nondistended. Normoactive bowel sounds. Integument:  Warm & dry. No rash on exposed skin.   PULMONARY No results for input(s): PHART, PCO2ART, PO2ART, HCO3, TCO2, O2SAT in the last 168 hours.  Invalid input(s): PCO2, PO2  CBC  Recent Labs Lab 11/15/16 0500 11/16/16 0500 11/17/16 0945  HGB 10.0* 9.7* 9.8*  HCT 31.4* 31.3* 31.2*  WBC 15.6* 15.2* 17.7*  PLT 350 354 344    COAGULATION  Recent Labs Lab 11/13/16 0300 11/14/16 0523 11/15/16 0500 11/16/16 0500 11/17/16 0500  INR 1.34 1.87 2.16 2.88 3.97    CARDIAC  No results for input(s): TROPONINI in the last 168 hours. No results for input(s): PROBNP in the last 168 hours.   CHEMISTRY  Recent Labs Lab 11/12/16 0235 11/13/16 0300 11/13/16 1043 11/14/16 0523 11/14/16 1845 11/15/16 0500 11/16/16 0500 11/17/16 0945  NA 135 136  --  135  --  136 134* 133*  K 3.1* 3.8  --  3.1* 3.9 3.6 3.2* 4.4  CL 95* 98*  --  96*  --  98* 95* 95*  CO2 30 23  --  25  --  27 27 27   GLUCOSE 148* 46* 494* 115*  --  68 147* 290*  BUN 19 26*  --  32*  --  8 13 11   CREATININE 3.03* 4.44*  --  5.76*  --  2.83* 4.10* 3.64*  CALCIUM 8.6* 8.7*  --  8.5*  --  8.0* 7.8* 8.2*  MG 1.7 1.7  --  1.7 2.0 2.1  --   --   PHOS 2.0* 1.7*  --  2.1*  --  1.2* 3.4  --    Estimated Creatinine Clearance: 13.2 mL/min (by C-G formula based on SCr of 3.64 mg/dL (H)).   LIVER  Recent Labs Lab 11/13/16 0300 11/14/16 0523 11/15/16 0500 11/16/16 0500 11/17/16 0500 11/17/16 0945  AST  --   --   --   --   --  18  ALT  --   --   --   --   --  <5*  ALKPHOS  --   --   --   --   --  73  BILITOT  --   --   --   --   --  1.0  PROT  --   --   --   --   --  5.6*  ALBUMIN 2.1* 1.9* 1.9* 1.8*  --  1.9*  INR 1.34 1.87 2.16 2.88 3.97  --      INFECTIOUS No results for input(s): LATICACIDVEN, PROCALCITON in the last 168 hours.   ENDOCRINE CBG (last 3)   Recent Labs  11/17/16 0440  11/17/16 0820 11/17/16 1233  GLUCAP 334* 233* 263*     MICROBIOLOGY: Urine Ctx 11/27:  Multiple species MRSA PCR 12/1:  Positive Blood Ctx x2 12/5:  2/2 MSSA Blood Ctx x1 12/7 >> Catheter Tip Culture 12/9 >>   ANTIBIOTICS: Cefepime 12/6 - 12/9 Vancomycin 12/6 - 12/9 Rifapmin 12/7 >> Cefazolin 12/9 >>  LINES/TUBES: PIV  ASSESSMENT / PLAN:   60 year old female with prolonged hospital stay, originally suffered a STEMI with papillary muscle rupture requiring surgical repair in September. The course of stay was complicated by acute on chronic renal failure, requiring CRRT, atrial flutter with underwent cardioversion 10/2. Underwent tracheostomy 10/5 and was eventually weaned and decannulated 10/25. She was transferred to [facility] for further rehabilitation where she was slow to progress but it was eventually discharged 11/29. On 11/30 presented back to ED for dyspnea, requiring tracheostomy on 12/7 by Dr. [name], and a PEG tube placement on 12/11.    Acute on chronic respiratory failure secondary to Vocal Cord Paralysis s/p ENT trach 12/7  HCAP H/O Tracheostomy (After prolonged intubation), second on 12/7 Plan: Daily Wean as tolerated  Vent support as needed  Trach Collar as tolerated Daily  Continue working with speech therapy Keep oxygen saturation >92   Other medical issues per [facility]   Hayden Pedro, AG-ACNP [facility]  Pgr: [phone]  PCCM Pgr: [phone]

## 2016-11-17 NOTE — Progress Notes (Addendum)
[name] TEAM 1 - Stepdown/ICU TEAM  Deborah [name]  AQT:[id number] DOB: 07/27/1956 DOA: 11/02/2016 PCP: Deborah Mast, MD    Brief Narrative:  59-yo F Hx Prolonged intubation requiring tracheostomy placement and subsequent decannulation, STEMI with Papillary Muscle Rupture requiring surgical repair in September, Atrial Flutter S/P cardioversion on Coumadin, HTN, DM2, HLD, and ESRD on HD (T/TH/Sat) who presented with labored breathing during her hemodialysis session. She was intubated requiring trach. Decannulated in the end of October. She then went to [facility] and was discharged 11/29. No presenting to [facility] ED 11/30 with compalints of SOB x1-2 weeks. She was seen by ENT they found cords were not opening from either scarring, inflammation, or nerve issue. They were hoping she would improve in the coming days, however, she did not and was taken for tracheostomy 12/7. Now with MSSA bacteremia,hcap, new PEG 12/11. Refused TEE 12/14  Significant Events: 09/12 Admit to [facility] 09/15 Tx to [facility] 09/20 MVR with bioprosthetic valve 09/28 Permcath for HD 09/29 Extubated 09/30 Reintubated 10/05 Trach by Hyman Bible 10/09 Off vent 10/25 Decannulated 11/8 to CIR 11/29 DC from CIR 11/30 admit for resp distress 127 redo trach Tracheostomy 6.4 cuffed [name] - ENT)  12/09 L IJ Tunneled HD catheter removed  12/11 PEG tube in IR - L IJ double lumen cath in IR   Subjective: Patient is resting comfortably in bed.  She denies chest pain fevers chills or abdominal pain.  When I ask her if she is short of breath she shakes her head to indicate that she is not.    Assessment & Plan: Vocal cord paralysis S/P repeat tracheostomy by ENT 12/7 Acute on chronic respiratory failure status post   tracheostomy tracheostomy care and vent management per ENT/PCCM on vent/full support Family meeting, called in by palliative care. Patient's family has concerns about ventilatory dependence,  pulmonary notified that family had  questions for them Requested CTS to evaluate based on family's request, evaluate for vent  dependence, need for TEE in the setting of new mitral valve replacement 9/20, as patient declined to have this done by cardiology   HCAP ID following &guiding antibiotics,Blood Ctx x2 12/5:  2/2 MSSA. ID recommends TEE  MSSA Bacteremia  Presumed HD cath related - HD cath out per ID - to have TEE , contacted cardiology to confirm that she is on the schedule for TEE - ID following   ESRD on HD (T/TH/Sat) HD continues via AVG - Nephrology following ,started HD Sept 2017 HD 12/14, in the room  Hypokalemia Continue to replete   Hypomagnesemia Corrected  Paroxysmal Atrial Fibrillation Rate controlled - on amiodarone , heparin discontinued given therapeutic INR  Bioprosthetic heart valve ID requesting TEE, family declined  Acute on Chronic Hypotension Cont midodrine - blood pressure stable  Inferior STEMI S/P DES x 05 August 2016 continue Plavix  DM2 9/12 A1c 13.3  Anxiety Appears controlled at the present time  Nutrition: Albumin 1.9. Tube feeding on hold. Peg in place  DVT prophylaxis: IV heparin Code Status: FULL CODE Family Communication: niece by bedside  Disposition Plan: SDU, palliative care consult Met with palliative care 12/12, family meeting planned for   12/14.  Consultants:  PCCM Nephrology  ID ENT CTS  Antimicrobials:  Cefepime 12/6 - 12/9 Vancomycin 12/6 - 12/9 Rifapmin 12/7 > Cefazolin 12/9 >  Objective: Blood pressure (!) 92/59, pulse 91, temperature 98.4 F (36.9 C), temperature source Oral, resp. rate (!) 26, height 5' 2"  (1.575 m), weight 56.8 kg (125 lb  3.5 oz), SpO2 100 %.  Intake/Output Summary (Last 24 hours) at 11/17/16 0933 Last data filed at 11/17/16 0517  Gross per 24 hour  Intake             1033 ml  Output                0 ml  Net             1033 ml   Filed Weights   11/16/16 1400 11/16/16 1740 11/17/16 0400  Weight: 56.8  kg (125 lb 3.5 oz) 56.8 kg (125 lb 3.5 oz) 56.8 kg (125 lb 3.5 oz)    Examination: General:  Elderly female. In NAD. Neuro:  Follows commands. No meningismus. Nods to questions. HEENT:  Moist membranes. No scleral injection. No further bleeding around tracheostomy stoma. Cardiovascular:  Regular rate. No edema or JVD. Pulmonary:  Good aeration bilaterally. Symmetric chest wall expansion on ventilator. Back on PRVC. Abdomen:  Soft. Nondistended. Normoactive bowel sounds. Integument:  Warm & dry. No rash on exposed skin.   CBC:  Recent Labs Lab 11/12/16 0235 11/13/16 0300 11/14/16 0523 11/15/16 0500 11/16/16 0500  WBC 18.7* 15.6* 13.6* 15.6* 15.2*  HGB 10.6* 10.8* 10.0* 10.0* 9.7*  HCT 34.0* 34.0* 31.9* 31.4* 31.3*  MCV 87.2 86.1 86.2 85.8 87.9  PLT 251 297 329 350 829   Basic Metabolic Panel:  Recent Labs Lab 11/12/16 0235 11/13/16 0300 11/13/16 1043 11/14/16 0523 11/14/16 1845 11/15/16 0500 11/16/16 0500  NA 135 136  --  135  --  136 134*  K 3.1* 3.8  --  3.1* 3.9 3.6 3.2*  CL 95* 98*  --  96*  --  98* 95*  CO2 30 23  --  25  --  27 27  GLUCOSE 148* 46* 494* 115*  --  68 147*  BUN 19 26*  --  32*  --  8 13  CREATININE 3.03* 4.44*  --  5.76*  --  2.83* 4.10*  CALCIUM 8.6* 8.7*  --  8.5*  --  8.0* 7.8*  MG 1.7 1.7  --  1.7 2.0 2.1  --   PHOS 2.0* 1.7*  --  2.1*  --  1.2* 3.4   GFR: Estimated Creatinine Clearance: 11.7 mL/min (by C-G formula based on SCr of 4.1 mg/dL (H)).  Liver Function Tests:  Recent Labs Lab 11/12/16 0235 11/13/16 0300 11/14/16 0523 11/15/16 0500 11/16/16 0500  ALBUMIN 2.1* 2.1* 1.9* 1.9* 1.8*    Coagulation Profile:  Recent Labs Lab 11/13/16 0300 11/14/16 0523 11/15/16 0500 11/16/16 0500 11/17/16 0500  INR 1.34 1.87 2.16 2.88 3.97    HbA1C: Hgb A1c MFr Bld  Date/Time Value Ref Range Status  08/15/2016 01:38 PM 13.3 (H) 4.8 - 5.6 % Final    Comment:    (NOTE)         Pre-diabetes: 5.7 - 6.4         Diabetes: >6.4          Glycemic control for adults with diabetes: <7.0   07/09/2014 01:54 PM 7.6 (H) 4.6 - 6.5 % Final    Comment:    Glycemic Control Guidelines for People with Diabetes:Non Diabetic:  <6%Goal of Therapy: <7%Additional Action Suggested:  >8%     CBG:  Recent Labs Lab 11/16/16 1645 11/16/16 2024 11/17/16 0009 11/17/16 0440 11/17/16 0820  GLUCAP 203* 182* 287* 334* 233*    Recent Results (from the past 240 hour(s))  Culture, blood (Routine X 2) w  Reflex to ID Panel     Status: Abnormal   Collection Time: 11/07/16  7:02 PM  Result Value Ref Range Status   Specimen Description BLOOD RIGHT ANTECUBITAL  Final   Special Requests IN PEDIATRIC BOTTLE 2CC  Final   Culture  Setup Time   Final    AEROBIC BOTTLE ONLY GRAM POSITIVE COCCI Organism ID to follow CRITICAL RESULT CALLED TO, READ BACK BY AND VERIFIED WITH: Narda Bonds, PHARMD @2343  11/08/16 MKELLY,MLT    Culture STAPHYLOCOCCUS AUREUS (A)  Final   Report Status 11/11/2016 FINAL  Final   Organism ID, Bacteria STAPHYLOCOCCUS AUREUS  Final      Susceptibility   Staphylococcus aureus - MIC*    CIPROFLOXACIN 2 INTERMEDIATE Intermediate     ERYTHROMYCIN >=8 RESISTANT Resistant     GENTAMICIN <=0.5 SENSITIVE Sensitive     OXACILLIN 0.5 SENSITIVE Sensitive     TETRACYCLINE <=1 SENSITIVE Sensitive     VANCOMYCIN <=0.5 SENSITIVE Sensitive     TRIMETH/SULFA <=10 SENSITIVE Sensitive     CLINDAMYCIN <=0.25 RESISTANT Resistant     RIFAMPIN <=0.5 SENSITIVE Sensitive     Inducible Clindamycin POSITIVE Resistant     * STAPHYLOCOCCUS AUREUS  Blood Culture ID Panel (Reflexed)     Status: Abnormal   Collection Time: 11/07/16  7:02 PM  Result Value Ref Range Status   Enterococcus species NOT DETECTED NOT DETECTED Final   Listeria monocytogenes NOT DETECTED NOT DETECTED Final   Staphylococcus species DETECTED (A) NOT DETECTED Final    Comment: CRITICAL RESULT CALLED TO, READ BACK BY AND VERIFIED WITH: JAMES LEDFORD, PHARMD @2343  11/08/16  MKELLY,MLT    Staphylococcus aureus DETECTED (A) NOT DETECTED Final    Comment: CRITICAL RESULT CALLED TO, READ BACK BY AND VERIFIED WITH: Narda Bonds, PHARMD @2343  11/08/16 MKELLY,MLT    Methicillin resistance NOT DETECTED NOT DETECTED Final   Streptococcus species NOT DETECTED NOT DETECTED Final   Streptococcus agalactiae NOT DETECTED NOT DETECTED Final   Streptococcus pneumoniae NOT DETECTED NOT DETECTED Final   Streptococcus pyogenes NOT DETECTED NOT DETECTED Final   Acinetobacter baumannii NOT DETECTED NOT DETECTED Final   Enterobacteriaceae species NOT DETECTED NOT DETECTED Final   Enterobacter cloacae complex NOT DETECTED NOT DETECTED Final   Escherichia coli NOT DETECTED NOT DETECTED Final   Klebsiella oxytoca NOT DETECTED NOT DETECTED Final   Klebsiella pneumoniae NOT DETECTED NOT DETECTED Final   Proteus species NOT DETECTED NOT DETECTED Final   Serratia marcescens NOT DETECTED NOT DETECTED Final   Haemophilus influenzae NOT DETECTED NOT DETECTED Final   Neisseria meningitidis NOT DETECTED NOT DETECTED Final   Pseudomonas aeruginosa NOT DETECTED NOT DETECTED Final   Candida albicans NOT DETECTED NOT DETECTED Final   Candida glabrata NOT DETECTED NOT DETECTED Final   Candida krusei NOT DETECTED NOT DETECTED Final   Candida parapsilosis NOT DETECTED NOT DETECTED Final   Candida tropicalis NOT DETECTED NOT DETECTED Final  Culture, blood (Routine X 2) w Reflex to ID Panel     Status: Abnormal   Collection Time: 11/07/16  7:08 PM  Result Value Ref Range Status   Specimen Description BLOOD RIGHT HAND  Final   Special Requests BOTTLES DRAWN AEROBIC ONLY 5CC  Final   Culture  Setup Time   Final    GRAM POSITIVE COCCI IN CLUSTERS AEROBIC BOTTLE ONLY CRITICAL VALUE NOTED.  VALUE IS CONSISTENT WITH PREVIOUSLY REPORTED AND CALLED VALUE.    Culture (A)  Final    STAPHYLOCOCCUS AUREUS SUSCEPTIBILITIES PERFORMED ON PREVIOUS  CULTURE WITHIN THE LAST 5 DAYS.    Report Status  11/11/2016 FINAL  Final  Culture, blood (Routine X 2) w Reflex to ID Panel     Status: None   Collection Time: 11/09/16  6:05 PM  Result Value Ref Range Status   Specimen Description BLOOD RIGHT HAND  Final   Special Requests BOTTLES DRAWN AEROBIC ONLY [place]  Final   Culture NO GROWTH 5 DAYS  Final   Report Status 11/14/2016 FINAL  Final  Cath Tip Culture     Status: None   Collection Time: 11/11/16  2:58 PM  Result Value Ref Range Status   Specimen Description CATH TIP  Final   Special Requests NONE  Final   Culture NO GROWTH 3 DAYS  Final   Report Status 11/15/2016 FINAL  Final     Scheduled Meds: . amiodarone  200 mg Per Tube Daily  . atorvastatin  20 mg Per Tube q1800  .  ceFAZolin (ANCEF) IV  1 g Intravenous Q24H  . chlorhexidine gluconate (MEDLINE KIT)  15 mL Mouth Rinse BID  . clonazePAM  0.5 mg Per Tube BID  . clopidogrel  75 mg Per Tube Daily  . [START ON 11/18/2016] darbepoetin (ARANESP) injection - DIALYSIS  100 mcg Intravenous Q Sat-HD  . docusate  100 mg Per Tube BID  . ferric gluconate (FERRLECIT/NULECIT) IV  125 mg Intravenous Q T,Th,Sa-HD  . guaiFENesin-codeine  5 mL Per Tube Q6H  . insulin aspart  0-9 Units Subcutaneous Q4H  . mouth rinse  15 mL Mouth Rinse QID  . metoCLOPramide  10 mg Per Tube BID  . midodrine  10 mg Per Tube q morning - 10a  . multivitamin  1 tablet Oral QHS  . neomycin-bacitracin-polymyxin  1 application Topical Daily  . pantoprazole sodium  40 mg Per Tube Daily  . rifampin  300 mg Per Tube Q8H  . sennosides  5 mL Per Tube BID  . sodium chloride flush  3 mL Intravenous Q12H  . Warfarin - Pharmacist Dosing Inpatient   Does not apply q1800   Continuous Infusions: . sodium chloride 10 mL/hr at 11/16/16 2033  . feeding supplement (VITAL AF 1.2 CAL) 1,000 mL (11/16/16 2122)     LOS: 15 days   Cherene Altes, MD [facility] Office  [phone] Pager - Text Page per Amion as per below:  On-Call/Text Page:      [email or web address]       password [email or web address]  If 7PM-7AM, please contact night-coverage [email or web address] Password [facility] 11/17/2016, 9:33 AM

## 2016-11-17 NOTE — Progress Notes (Signed)
Daily Progress Note   Patient Name: Deborah [name]       Date: 11/17/2016 DOB: 11-Jun-1956  Age: 60 y.o. MRN#: [id number] Attending Physician: Reyne Dumas, MD Primary Care Physician: Rica Mast, MD Admit Date: 11/02/2016  Reason for Consultation/Follow-up: Establishing goals of care  Subjective: I met today with Deborah [name], son [name], daughter Deborah [name], multiple siblings, and a niece. We reviewed Deborah [name]'s medical history since September to shed better light on why she is now in this current situation. Deborah [name] specifically had many questions and I answered the best I am able after extensive review of history but I am not sure that she trusted my answers or explanations anyway.   My goal was to make sure that we all have the same information. It was clear that their goals are for continued aggressive efforts with hope of recovery.   The idea of disposition came up and everyone was very upset. I explained that we do not have a date for discharge at this time but we are always trying to plan and look ahead. I did explain the complexity of Deborah [name] requiring HD as well as a trach currently requiring mechanical ventilation and that these are very specialized interventions and there are not many facilities that provide both. They talk of home - HOWEVER, I do not believe she would be able to do outpatient HD at this time. I explained that all this depends on her progress.   Unfortunately this was not a very productive meeting. I believe Deborah [name]'s siblings understand. Daughter Deborah [name] is struggling and frustrated. I will continue to follow and support any way I am able.   Review of Systems  All other systems reviewed and are negative.   Length of Stay: 15  Current Medications: Scheduled  Meds:  . amiodarone  200 mg Per Tube Daily  . atorvastatin  20 mg Per Tube q1800  .  ceFAZolin (ANCEF) IV  1 g Intravenous Q24H  . chlorhexidine gluconate (MEDLINE KIT)  15 mL Mouth Rinse BID  . clonazePAM  0.5 mg Per Tube BID  . clopidogrel  75 mg Per Tube Daily  . [START ON 11/18/2016] darbepoetin (ARANESP) injection - DIALYSIS  100 mcg Intravenous Q Sat-HD  . docusate  100 mg Per Tube BID  . ferric gluconate (FERRLECIT/NULECIT) IV  125 mg Intravenous Q T,Th,Sa-HD  . guaiFENesin-codeine  5 mL Per Tube Q6H  . insulin aspart  0-9 Units Subcutaneous Q4H  . mouth rinse  15 mL Mouth Rinse QID  . metoCLOPramide  10 mg Per Tube BID  . midodrine  10 mg Per Tube q morning - 10a  . multivitamin  1 tablet Oral QHS  . neomycin-bacitracin-polymyxin  1 application Topical Daily  . pantoprazole sodium  40 mg Per Tube Daily  . rifampin  300 mg Per Tube Q8H  . sennosides  5 mL Per Tube BID  . sodium chloride flush  3 mL Intravenous Q12H  . Warfarin - Pharmacist Dosing Inpatient   Does not apply q1800    Continuous Infusions: . sodium chloride 10 mL/hr at 11/16/16 2033  . feeding supplement (VITAL AF 1.2 CAL) 1,000 mL (11/17/16 1700)    PRN Meds: fentaNYL (SUBLIMAZE) injection, fluticasone, levalbuterol, ondansetron (ZOFRAN) IV, prochlorperazine  Physical Exam  Constitutional: She is oriented to person, place, and time. She appears well-developed and well-nourished. No distress. She is intubated.  Neck:  +tracheostomy with mechanical ventilation  Cardiovascular: Normal rate and regular rhythm.   Pulmonary/Chest: Effort normal. No tachypnea. She is intubated. No respiratory distress.  On pressure support weaning mode - tolerating well  Abdominal: Soft. Normal appearance.  PEG in place with continuous TF running  Musculoskeletal: She exhibits edema.  Neurological: She is alert and oriented to person, place, and time.  Skin: Skin is warm and dry.  Nursing note and vitals reviewed.             Vital Signs: BP 95/64   Pulse 92   Temp 97.9 F (36.6 C) (Oral)   Resp 18   Ht 5' 2"  (1.575 m)   Wt 56.8 kg (125 lb 3.5 oz)   SpO2 100%   BMI 22.90 kg/m  SpO2: SpO2: 100 % O2 Device: O2 Device: Ventilator O2 Flow Rate: O2 Flow Rate (L/min): 12 L/min  Intake/output summary:   Intake/Output Summary (Last 24 hours) at 11/17/16 1847 Last data filed at 11/17/16 1730  Gross per 24 hour  Intake             1613 ml  Output                0 ml  Net             1613 ml   LBM: Last BM Date: 11/17/16 Baseline Weight: Weight: 55.3 kg (122 lb) Most recent weight: Weight: 56.8 kg (125 lb 3.5 oz)       Palliative Assessment/Data: PPS: 10%    Flowsheet Rows   Flowsheet Row Most Recent Value  Intake Tab  Referral Department  Hospitalist  Unit at Time of Referral  ICU  Palliative Care Primary Diagnosis  Cardiac  Date Notified  11/14/16  Palliative Care Type  New Palliative care  Reason for referral  Clarify Goals of Care  Date of Admission  11/02/16  # of days IP prior to Palliative referral  12  Clinical Assessment  Psychosocial & Spiritual Assessment  Palliative Care Outcomes      Patient Active Problem List   Diagnosis Date Noted  . Advance care planning   . Vocal cord dysfunction   . Palliative care by specialist   . Goals of care, counseling/discussion   . Bacteremia   . End-stage renal disease on hemodialysis ([place])   . Paroxysmal atrial fibrillation ([name])   . Heart attack   .  Uncontrolled type 2 diabetes mellitus with complication, without long-term current use of insulin ([place])   . Staphylococcus aureus bacteremia 11/12/2016  . Vocal cord paralysis   . Respiratory distress 11/02/2016  . Chronic anticoagulation   . Acute lower UTI   . Labile blood glucose   . Leukocytosis   . Subtherapeutic international normalized ratio (INR)   . Supratherapeutic INR   . Cough   . Oropharyngeal dysphagia   . Uncontrolled type 2 diabetes mellitus with complication, with  long-term current use of insulin ([place])   . Physical debility 10/11/2016  . Metabolic encephalopathy 04/13/210  . Debility   . ESRD on dialysis ([place])   . Type 2 diabetes mellitus with complication, with long-term current use of insulin ([place])   . Anemia of chronic disease   . Coronary artery disease involving native coronary artery of native heart without angina pectoris   . Acute on chronic respiratory failure with hypoxia ([place])   . Atrial flutter ([place])   . Persistent cough   . Dysphonia   . Sleep disturbance   . Diabetes mellitus type 2 in nonobese ([name])   . Stage 3 chronic kidney disease   . NSTEMI (non-ST elevated myocardial infarction) ([place])   . Tachypnea   . Hyponatremia   . Lymphocytosis   . Acute blood loss anemia   . Pressure injury of skin 09/18/2016  . Tracheostomy status ([place])   . Delirium   . Encounter for attention to tracheostomy ([place])   . History of MVR with cardiopulmonary bypass   . Systolic congestive heart failure ([place])   . HCAP (healthcare-associated pneumonia)   . PAF (paroxysmal atrial fibrillation) ([place])   . Acute systolic congestive heart failure ([place])   . AKI (acute kidney injury) ([place])   . Abnormal LFTs   . S/P MVR (mitral valve replacement)   . Mitral regurgitation 08/23/2016  . Acute on chronic diastolic heart failure ([place]) 08/18/2016  . Acute pulmonary edema ([name])   . Respiratory failure ([place]) 08/17/2016  . Cardiogenic shock ([place]) 08/17/2016  . Acute hypoxemic respiratory failure ([place])   . STEMI (ST elevation myocardial infarction) ([place]) 08/15/2016  . Noncompliance 01/25/2015  . Bereavement 07/09/2014  . Low back pain 02/17/2014  . Cervical cancer screening 11/20/2013  . Routine general medical examination at a health care facility 10/14/2013  . Chronic kidney disease, stage 3 06/14/2012  . Hypertension 03/08/2012  . Diabetes mellitus type 2, controlled 03/08/2012  . Hyperlipidemia 03/08/2012    Palliative Care Assessment & Plan    Patient Profile: 60 y.o. female  with past medical history of DM, HTN,  ESRD (now on perm HD),  admitted on 11/02/2016 with shortness of breath. She recently had a prolonged hospitalization spanning September through November d/t STEMI with papillary muscle rupture. During that stay she had prolong intubation and required trach, but was able to be decannulated and returned home after going to CIR. She was at home for less than a day when she returned to ED with complaints of increasing SOB. ENT workup revealed paralyzed vocal cords with airway obstruction requiring tracheostomy on 12/7 and PEG placement 12/11. She now also has HCAP with MSSA bacteremia.   Assessment/Recommendations/Plan   Continue weaning efforts with hopes of minimizing ventilator support. Pt/family hopeful for recovery and eventually for return home.   Family (daughter Deborah [name] specifically) wishes to speak with pulmonary MD and has many questions.   Will continue to support and help with decisions as they arise.   Goals  of Care and Additional Recommendations:  Limitations on Scope of Treatment: Full Scope Treatment  Code Status:  Full code  Prognosis:   Unable to determine  Discharge Planning:  To Be Determined   Thank you for allowing the Palliative Medicine Team to assist in the care of this patient.   Time In: 1100 Time Out: 1210 Total Time 70 minutes Prolonged Time Billed yes      Greater than 50%  of this time was spent counseling and coordinating care related to the above assessment and plan.  Vinie Sill, NP Palliative Medicine Team Pager # [phone] (M-F 8a-5p) Team Phone # [phone] (Nights/Weekends)   Please contact Palliative Medicine Team phone at [phone] for questions and concerns.

## 2016-11-17 NOTE — Progress Notes (Signed)
Physical Therapy Treatment Patient Details Name: Deborah [name] MRN: [id number] DOB: [birth year] Today's Date: 11/17/2016    History of Present Illness Pt is a 60 y/o female admitted to [facility] on 11/30 secondary to dyspnea after being discharged on 11/29 from CIR. Pt was admitted to CIR from 11/8 through 11/29. PMH including but not limited to A-fib/flutter, DM, ESRD, and CAD s/p STEMI in September (08/15/16). Pt was intubated on 9/30 with trach placement on 10/5. She progressed to weaning off of vent on 10/09 and decannulated on 10/25. She has since had to have a second trach placement this admission on 11/09/16.    PT Comments    Pt presented supine in bed with HOB elevated, awake and willing to participate in therapy session. Pt making slow progress with mobility. She was able to take two side steps with mod A x2 at bedside this session, but very limited secondary to weakness and fatigue. All VSS throughout session. Pt would continue to benefit from skilled physical therapy services at this time while admitted and after d/c to address her limitations in order to improve her overall safety and independence with functional mobility.   Follow Up Recommendations  SNF     Equipment Recommendations  None recommended by PT    Recommendations for Other Services       Precautions / Restrictions Precautions Precautions: Fall;Other (comment) (trach, vent, PEG) Restrictions Weight Bearing Restrictions: No    Mobility  Bed Mobility Overal bed mobility: Needs Assistance Bed Mobility: Supine to Sit;Sit to Supine     Supine to sit: Mod assist;+2 for physical assistance Sit to supine: Max assist;+2 for physical assistance;+2 for safety/equipment   General bed mobility comments: Pt required increased time, use of bed rails, and mod A x2 at trunk and use of bed pad to position hips to achieve sitting EOB. To return to supine from sitting, pt required max A x2 at bilateral LEs and  trunk  Transfers Overall transfer level: Needs assistance Equipment used: 2 person hand held assist Transfers: Sit to/from Stand Sit to Stand: Mod assist;+2 physical assistance;+2 safety/equipment         General transfer comment: pt required increased time and mod A x2 to achieve standing from bed, performed x2 from bed   Ambulation/Gait Ambulation/Gait assistance: Mod assist;+2 physical assistance Ambulation Distance (Feet):  (two side steps) Assistive device: 2 person hand held assist       General Gait Details: pt able to take two side steps at bedside with mod A x2, two person HHA. pt very limited secondary to fatigue, weakness and being on vent.   Stairs            Wheelchair Mobility    Modified Rankin (Stroke Patients Only)       Balance Overall balance assessment: Needs assistance Sitting-balance support: Feet supported;No upper extremity supported Sitting balance-Leahy Scale: Fair     Standing balance support: During functional activity;Bilateral upper extremity supported Standing balance-Leahy Scale: Poor Standing balance comment: pt reliant on bilateral UEs on therapist and tech (2 person HHA)                    Cognition Arousal/Alertness: Awake/alert Behavior During Therapy: WFL for tasks assessed/performed;Flat affect Overall Cognitive Status: Difficult to assess                      Exercises      General Comments        Pertinent Vitals/Pain  Pain Assessment: No/denies pain    Home Living                      Prior Function            PT Goals (current goals can now be found in the care plan section) Acute Rehab PT Goals Patient Stated Goal: none stated PT Goal Formulation: With patient Time For Goal Achievement: 11/29/16 Potential to Achieve Goals: Fair Progress towards PT goals: Progressing toward goals    Frequency    Min 3X/week      PT Plan Current plan remains appropriate     Co-evaluation             End of Session Equipment Utilized During Treatment: Gait belt;Other (comment) (vent) Activity Tolerance: Patient limited by fatigue Patient left: in bed;with call bell/phone within reach;with family/visitor present;with SCD's reapplied     Time: 7793-9030 PT Time Calculation (min) (ACUTE ONLY): 23 min  Charges:  $Therapeutic Activity: 23-37 mins                    G CodesClearnce Sorrel Goodwin [place] 2016/11/28, 3:54 PM Sherie Don, [place], DPT [phone]

## 2016-11-17 NOTE — Progress Notes (Signed)
ANTICOAGULATION CONSULT NOTE - Follow Up Consult  Pharmacy Consult for Heparin & Warfarin  Indication: atrial fibrillation and bioprosthetic MVR  No Known Allergies  Patient Measurements: Height: 5\' 2"  (157.5 cm) Weight: 125 lb 3.5 oz (56.8 kg) IBW/kg (Calculated) : 50.1  Vital Signs: Temp: 98.4 F (36.9 C) (12/15 0824) Temp Source: Oral (12/15 0824) BP: 92/59 (12/15 0824) Pulse Rate: 91 (12/15 0827)  Labs:  Recent Labs  11/14/16 1149 11/15/16 0450 11/15/16 0500 11/16/16 0500 11/17/16 0500  HGB  --   --  10.0* 9.7*  --   HCT  --   --  31.4* 31.3*  --   PLT  --   --  350 354  --   LABPROT  --   --  24.4* 30.8* 39.8*  INR  --   --  2.16 2.88 3.97  HEPARINUNFRC 0.57 0.59  --  0.47  --   CREATININE  --   --  2.83* 4.10*  --     Estimated Creatinine Clearance: 11.7 mL/min (by C-G formula based on SCr of 4.1 mg/dL (H)).   Assessment: 60 yo M with h/o atrial fibrillation, PTA warfarin was on hold. Pharmacy originally consulted for heparin gtt. Restarted wafarin per pharmacy on 12/13 now that G-tube placed. PTA warfarin dose was 2mg  daily except for 4mg  on MWF. Had not had warfarin over a week but INR noted to be trending up. No known liver dysfunction. Also on rifampin which may decrease the effectiveness of warfarin. Given one 4mg  dose of warfarin on 12/13 and INR increased from 2.16 to 2.88 and now with lower dose yesterday INR up to 3.97. CBC low, but stable. No overt s/s bleeding.   Goal of Therapy:  INR 2.0-3.0   Plan:  Hold Coumadin today Monitor daily INR, CBC, s/s of bleed  Elenor Quinones, PharmD, BCPS Clinical Pharmacist Pager [phone] 11/17/2016 10:15 AM

## 2016-11-17 NOTE — Progress Notes (Signed)
PT placed on trach collar  12LPM 40%  Sating good and stable

## 2016-11-17 NOTE — Progress Notes (Signed)
Upon assessment, noted patient had bleeding under central line dressing, soiling gown.  Changed dressing with other RN and noted bleeding source to be at site of suture that has come out.  No further bleeding, cancelled consultation to IV team.

## 2016-11-17 NOTE — Progress Notes (Signed)
Daily Progress Note   Patient Name: Deborah [name]       Date: 11/17/2016 DOB: 1956/10/26  Age: 60 y.o. MRN#: [id number] Attending Physician: Reyne Dumas, MD Primary Care Physician: Rica Mast, MD Admit Date: 11/02/2016  Reason for Consultation/Follow-up: Establishing goals of care  Subjective: Ms. Espy is in better spirits today. [name], another daughter Apolonio Schneiders?), and her brother-in-law are at bedside. They are much more calm today. Ms. Balzarini continues to wean well and hopeful for trach collar trial today. We discussed techniques for distraction and entertainment to help with Ms. Chismar's QOL especially while hospitalized.   I received a call from daughter, [name], who I was to meet and she did not want to be with some of her family and I met with her privately elsewhere in the hospital. Extremely complicated family dynamics and tension. I explained same medical history and barriers to recovery at this time. [name] is more receptive. I answered all her questions. She says that she is supportive of what her mother desires. She was concerned as she tells me that she was concerned when her mother started to say how she doesn't want to live this way. However, I am not so certain that her prognosis is so poor that she will not make improvement. I do believe that it will be a long and difficult journey (possibly requiring out of state placement) with no guarantee to what this could be long term for her. [name] seems to understand and appreciates the discussion.   Review of Systems  All other systems reviewed and are negative.   Length of Stay: 15  Current Medications: Scheduled Meds:  . amiodarone  200 mg Per Tube Daily  . atorvastatin  20 mg Per Tube q1800  .  ceFAZolin (ANCEF) IV  1  g Intravenous Q24H  . chlorhexidine gluconate (MEDLINE KIT)  15 mL Mouth Rinse BID  . clonazePAM  0.5 mg Per Tube BID  . clopidogrel  75 mg Per Tube Daily  . [START ON 11/18/2016] darbepoetin (ARANESP) injection - DIALYSIS  100 mcg Intravenous Q Sat-HD  . docusate  100 mg Per Tube BID  . ferric gluconate (FERRLECIT/NULECIT) IV  125 mg Intravenous Q T,Th,Sa-HD  . guaiFENesin-codeine  5 mL Per Tube Q6H  . insulin aspart  0-9 Units Subcutaneous Q4H  . mouth  rinse  15 mL Mouth Rinse QID  . metoCLOPramide  10 mg Per Tube BID  . midodrine  10 mg Per Tube q morning - 10a  . multivitamin  1 tablet Oral QHS  . neomycin-bacitracin-polymyxin  1 application Topical Daily  . pantoprazole sodium  40 mg Per Tube Daily  . rifampin  300 mg Per Tube Q8H  . sennosides  5 mL Per Tube BID  . sodium chloride flush  3 mL Intravenous Q12H  . Warfarin - Pharmacist Dosing Inpatient   Does not apply q1800    Continuous Infusions: . sodium chloride 10 mL/hr at 11/16/16 2033  . feeding supplement (VITAL AF 1.2 CAL) 1,000 mL (11/17/16 1700)    PRN Meds: fentaNYL (SUBLIMAZE) injection, fluticasone, levalbuterol, ondansetron (ZOFRAN) IV, prochlorperazine  Physical Exam  Constitutional: She is oriented to person, place, and time. She appears well-developed and well-nourished. No distress. She is intubated.  Neck:  +tracheostomy with mechanical ventilation  Cardiovascular: Normal rate and regular rhythm.   Pulmonary/Chest: Effort normal. No tachypnea. She is intubated. No respiratory distress.  On pressure support weaning mode - tolerating well  Abdominal: Soft. Normal appearance.  PEG in place with continuous TF running  Musculoskeletal: She exhibits edema.  Neurological: She is alert and oriented to person, place, and time.  Skin: Skin is warm and dry.  Nursing note and vitals reviewed.           Vital Signs: BP 95/64   Pulse 92   Temp 97.9 F (36.6 C) (Oral)   Resp 18   Ht 5' 2" (1.575 m)   Wt  56.8 kg (125 lb 3.5 oz)   SpO2 100%   BMI 22.90 kg/m  SpO2: SpO2: 100 % O2 Device: O2 Device: Ventilator O2 Flow Rate: O2 Flow Rate (L/min): 12 L/min  Intake/output summary:   Intake/Output Summary (Last 24 hours) at 11/17/16 1911 Last data filed at 11/17/16 1730  Gross per 24 hour  Intake             1613 ml  Output                0 ml  Net             1613 ml   LBM: Last BM Date: 11/17/16 Baseline Weight: Weight: 55.3 kg (122 lb) Most recent weight: Weight: 56.8 kg (125 lb 3.5 oz)       Palliative Assessment/Data: PPS: 30%    Flowsheet Rows   Flowsheet Row Most Recent Value  Intake Tab  Referral Department  Hospitalist  Unit at Time of Referral  ICU  Palliative Care Primary Diagnosis  Cardiac  Date Notified  11/14/16  Palliative Care Type  New Palliative care  Reason for referral  Clarify Goals of Care  Date of Admission  11/02/16  # of days IP prior to Palliative referral  12  Clinical Assessment  Psychosocial & Spiritual Assessment  Palliative Care Outcomes      Patient Active Problem List   Diagnosis Date Noted  . Advance care planning   . Vocal cord dysfunction   . Palliative care by specialist   . Goals of care, counseling/discussion   . Bacteremia   . End-stage renal disease on hemodialysis ([place])   . Paroxysmal atrial fibrillation ([name])   . Heart attack   . Uncontrolled type 2 diabetes mellitus with complication, without long-term current use of insulin ([place])   . Staphylococcus aureus bacteremia 11/12/2016  . Vocal cord paralysis   .  Respiratory distress 11/02/2016  . Chronic anticoagulation   . Acute lower UTI   . Labile blood glucose   . Leukocytosis   . Subtherapeutic international normalized ratio (INR)   . Supratherapeutic INR   . Cough   . Oropharyngeal dysphagia   . Uncontrolled type 2 diabetes mellitus with complication, with long-term current use of insulin ([place])   . Physical debility 10/11/2016  . Metabolic encephalopathy 14/43/1540   . Debility   . ESRD on dialysis ([place])   . Type 2 diabetes mellitus with complication, with long-term current use of insulin ([place])   . Anemia of chronic disease   . Coronary artery disease involving native coronary artery of native heart without angina pectoris   . Acute on chronic respiratory failure with hypoxia ([place])   . Atrial flutter ([place])   . Persistent cough   . Dysphonia   . Sleep disturbance   . Diabetes mellitus type 2 in nonobese ([name])   . Stage 3 chronic kidney disease   . NSTEMI (non-ST elevated myocardial infarction) ([place])   . Tachypnea   . Hyponatremia   . Lymphocytosis   . Acute blood loss anemia   . Pressure injury of skin 09/18/2016  . Tracheostomy status ([place])   . Delirium   . Encounter for attention to tracheostomy ([place])   . History of MVR with cardiopulmonary bypass   . Systolic congestive heart failure ([place])   . HCAP (healthcare-associated pneumonia)   . PAF (paroxysmal atrial fibrillation) ([place])   . Acute systolic congestive heart failure ([place])   . AKI (acute kidney injury) ([place])   . Abnormal LFTs   . S/P MVR (mitral valve replacement)   . Mitral regurgitation 08/23/2016  . Acute on chronic diastolic heart failure ([place]) 08/18/2016  . Acute pulmonary edema ([name])   . Respiratory failure ([place]) 08/17/2016  . Cardiogenic shock ([place]) 08/17/2016  . Acute hypoxemic respiratory failure ([place])   . STEMI (ST elevation myocardial infarction) ([place]) 08/15/2016  . Noncompliance 01/25/2015  . Bereavement 07/09/2014  . Low back pain 02/17/2014  . Cervical cancer screening 11/20/2013  . Routine general medical examination at a health care facility 10/14/2013  . Chronic kidney disease, stage 3 06/14/2012  . Hypertension 03/08/2012  . Diabetes mellitus type 2, controlled 03/08/2012  . Hyperlipidemia 03/08/2012    Palliative Care Assessment & Plan   Patient Profile: 60 y.o. female  with past medical history of DM, HTN,  ESRD (now on perm HD),  admitted on  11/02/2016 with shortness of breath. She recently had a prolonged hospitalization spanning September through November d/t STEMI with papillary muscle rupture. During that stay she had prolong intubation and required trach, but was able to be decannulated and returned home after going to CIR. She was at home for less than a day when she returned to ED with complaints of increasing SOB. ENT workup revealed paralyzed vocal cords with airway obstruction requiring tracheostomy on 12/7 and PEG placement 12/11. She now also has HCAP with MSSA bacteremia.   Assessment/Recommendations/Plan   Continue weaning efforts with hopes of minimizing ventilator support. Pt/family hopeful for recovery and eventually for return home is her goal.   Will continue to support and help with decisions as they arise.   Goals of Care and Additional Recommendations:  Limitations on Scope of Treatment: Full Scope Treatment  Code Status:  Full code  Prognosis:   Unable to determine  Discharge Planning:  To Be Determined   Thank you for allowing the Palliative  Medicine Team to assist in the care of this patient.   Time In: 1230 Time Out: 1315 Total Time 45 minutes Prolonged Time Billed  no      Greater than 50%  of this time was spent counseling and coordinating care related to the above assessment and plan.  Vinie Sill, NP Palliative Medicine Team Pager # [phone] (M-F 8a-5p) Team Phone # [phone] (Nights/Weekends)   Please contact Palliative Medicine Team phone at [phone] for questions and concerns.

## 2016-11-17 NOTE — Progress Notes (Signed)
PT asked to be put back on vent  Stated she was tired

## 2016-11-18 ENCOUNTER — Inpatient Hospital Stay (HOSPITAL_COMMUNITY): Payer: Medicaid Other

## 2016-11-18 LAB — PROTIME-INR
INR: 4.24 — AB
PROTHROMBIN TIME: 41.8 s — AB (ref 11.4–15.2)

## 2016-11-18 LAB — CBC
HEMATOCRIT: 28.1 % — AB (ref 36.0–46.0)
HEMOGLOBIN: 8.8 g/dL — AB (ref 12.0–15.0)
MCH: 27.1 pg (ref 26.0–34.0)
MCHC: 31.3 g/dL (ref 30.0–36.0)
MCV: 86.5 fL (ref 78.0–100.0)
Platelets: 324 10*3/uL (ref 150–400)
RBC: 3.25 MIL/uL — AB (ref 3.87–5.11)
RDW: 19.1 % — ABNORMAL HIGH (ref 11.5–15.5)
WBC: 16.8 10*3/uL — ABNORMAL HIGH (ref 4.0–10.5)

## 2016-11-18 LAB — GLUCOSE, CAPILLARY
GLUCOSE-CAPILLARY: 253 mg/dL — AB (ref 65–99)
GLUCOSE-CAPILLARY: 313 mg/dL — AB (ref 65–99)
GLUCOSE-CAPILLARY: 328 mg/dL — AB (ref 65–99)
GLUCOSE-CAPILLARY: 334 mg/dL — AB (ref 65–99)
Glucose-Capillary: 233 mg/dL — ABNORMAL HIGH (ref 65–99)
Glucose-Capillary: 155 mg/dL — ABNORMAL HIGH (ref 65–99)
Glucose-Capillary: 243 mg/dL — ABNORMAL HIGH (ref 65–99)

## 2016-11-18 LAB — RENAL FUNCTION PANEL
ANION GAP: 10 (ref 5–15)
Albumin: 1.7 g/dL — ABNORMAL LOW (ref 3.5–5.0)
BUN: 19 mg/dL (ref 6–20)
CALCIUM: 7.9 mg/dL — AB (ref 8.9–10.3)
CHLORIDE: 95 mmol/L — AB (ref 101–111)
CO2: 26 mmol/L (ref 22–32)
Creatinine, Ser: 4.36 mg/dL — ABNORMAL HIGH (ref 0.44–1.00)
GFR calc non Af Amer: 10 mL/min — ABNORMAL LOW (ref >60)
GFR, EST AFRICAN AMERICAN: 12 mL/min — AB (ref >60)
GLUCOSE: 318 mg/dL — AB (ref 65–99)
POTASSIUM: 3.7 mmol/L (ref 3.5–5.1)
Phosphorus: 2.9 mg/dL (ref 2.5–4.6)
SODIUM: 131 mmol/L — AB (ref 135–145)

## 2016-11-18 MED ORDER — LIDOCAINE HCL (PF) 1 % IJ SOLN: 5.0000 mL | INTRAMUSCULAR | Status: DC | PRN

## 2016-11-18 MED ORDER — SODIUM CHLORIDE 0.9 % IV SOLN: 100.0000 mL | INTRAVENOUS | Status: DC | PRN

## 2016-11-18 MED ORDER — ALTEPLASE 2 MG IJ SOLR: 2.0000 mg | Freq: Once | INTRAMUSCULAR | Status: DC | PRN

## 2016-11-18 MED ORDER — HEPARIN SODIUM (PORCINE) 1000 UNIT/ML DIALYSIS
1000.0000 [IU] | INTRAMUSCULAR | Status: DC | PRN
  Filled 2016-11-18: qty 1

## 2016-11-18 MED ORDER — PENTAFLUOROPROP-TETRAFLUOROETH EX AERO
1.0000 | INHALATION_SPRAY | CUTANEOUS | Status: DC | PRN
Start: 2016-11-18 — End: 2016-11-25

## 2016-11-18 MED ORDER — HEPARIN SODIUM (PORCINE) 1000 UNIT/ML DIALYSIS: 2000.0000 [IU] | INTRAMUSCULAR | Status: DC | PRN

## 2016-11-18 MED ORDER — PRO-STAT SUGAR FREE PO LIQD
30.0000 mL | Freq: Two times a day (BID) | ORAL | Status: DC
  Administered 2016-11-18 – 2016-11-26 (×17): 30 mL via ORAL
  Filled 2016-11-18 (×16): qty 30

## 2016-11-18 MED ORDER — PENTAFLUOROPROP-TETRAFLUOROETH EX AERO: 1.0000 "application " | INHALATION_SPRAY | CUTANEOUS | Status: DC | PRN

## 2016-11-18 MED ORDER — INSULIN DETEMIR 100 UNIT/ML ~~LOC~~ SOLN
6.0000 [IU] | Freq: Every day | SUBCUTANEOUS | Status: DC
  Administered 2016-11-19: 6 [IU] via SUBCUTANEOUS
  Filled 2016-11-18 (×2): qty 0.06

## 2016-11-18 MED ORDER — LIDOCAINE-PRILOCAINE 2.5-2.5 % EX CREA: 1.0000 "application " | TOPICAL_CREAM | CUTANEOUS | Status: DC | PRN

## 2016-11-18 MED ORDER — HEPARIN SODIUM (PORCINE) 1000 UNIT/ML DIALYSIS: 1000.0000 [IU] | INTRAMUSCULAR | Status: DC | PRN

## 2016-11-18 MED ORDER — LIDOCAINE-PRILOCAINE 2.5-2.5 % EX CREA
1.0000 "application " | TOPICAL_CREAM | CUTANEOUS | Status: DC | PRN
  Administered 2016-11-20: 1 via TOPICAL
  Filled 2016-11-18: qty 5

## 2016-11-18 NOTE — Progress Notes (Signed)
CRITICAL VALUE ALERT  Critical value received:  INR 4.24  Date of notification:  11/18/2016   Time of notification:  6:48 AM   Critical value read back: yes  Nurse who received alert:  Reatha Armour   MD notified (1st page):  M. Lynch  Time of first page:  6:49 AM   MD notified (2nd page):  Time of second page:  Responding MD:    Time MD responded:  [phone]

## 2016-11-18 NOTE — Progress Notes (Signed)
[facility] Progress Note   Subjective: Had HD this AM. Nurse reports pt had cramping/some nausea while on HD. TF recently resumed. Unclear if nausea R/T TF or HD. Resting now, son at bedside. Very cooperative pt.   Objective Vitals:   11/18/16 1045 11/18/16 1100 11/18/16 1115 11/18/16 1124  BP: 115/69 115/69 125/75   Pulse: 93 95 89 89  Resp: _0 Temp:      TempSrc:      SpO2:    100%  Weight:      Height:       Physical Exam General: Awake, alert, trach to vent. Communicates via nods (yes/no) to questions. Some lip speak.  Heart: S1,S2, RRR. SR on monitor Lungs: Trach to vent adeq cuff inflation, sym. Chest excursions. CTA A/P. Full vent support at present.  Abdomen: Soft, non-tender. Peg with TF.  Extremities: No LE edema. Dialysis Access: LUA AVG + bruit.     Additional Objective Labs: Basic Metabolic Panel:  Recent Labs Lab 11/15/16 0500 11/16/16 0500 11/17/16 0945 11/18/16 0757  NA 136 134* 133* 131*  K 3.6 3.2* 4.4 3.7  CL 98* 95* 95* 95*  CO2 _1 GLUCOSE 68 147* 290* 318*  BUN _2 CREATININE 2.83* 4.10* 3.64* 4.36*  CALCIUM 8.0* 7.8* 8.2* 7.9*  PHOS 1.2* 3.4  --  2.9   Liver Function Tests:  Recent Labs Lab 11/16/16 0500 11/17/16 0945 11/18/16 0757  AST  --  18  --   ALT  --  <5*  --   ALKPHOS  --  73  --   BILITOT  --  1.0  --   PROT  --  5.6*  --   ALBUMIN 1.8* 1.9* 1.7*   No results for input(s): LIPASE, AMYLASE in the last 168 hours. CBC:  Recent Labs Lab 11/14/16 0523 11/15/16 0500 11/16/16 0500 11/17/16 0945 11/18/16 0759  WBC 13.6* 15.6* 15.2* 17.7* 16.8*  HGB 10.0* 10.0* 9.7* 9.8* 8.8*  HCT 31.9* 31.4* 31.3* 31.2* 28.1*  MCV 86.2 85.8 87.9 86.9 86.5  PLT 329 350 354 344 324   Blood Culture    Component Value Date/Time   SDES CATH TIP 11/11/2016 1458   SPECREQUEST NONE 11/11/2016 1458   CULT NO GROWTH 3 DAYS 11/11/2016 1458   REPTSTATUS 11/15/2016 FINAL 11/11/2016 1458     Cardiac Enzymes: No results for input(s): CKTOTAL, CKMB, CKMBINDEX, TROPONINI in the last 168 hours. CBG:  Recent Labs Lab 11/17/16 2030 11/18/16 0035 11/18/16 0440 11/18/16 0743 11/18/16 1333  GLUCAP 250* 334* 233* 328* 155*   Iron Studies: No results for input(s): IRON, TIBC, TRANSFERRIN, FERRITIN in the last 72 hours. _3 @ Studies/Results: Ct Chest Wo Contrast  Result Date: 11/18/2016 CLINICAL DATA:  Hospital acquired pneumonia.  Ventilator. EXAM: CT CHEST WITHOUT CONTRAST TECHNIQUE: Multidetector CT imaging of the chest was performed following the standard protocol without IV contrast. COMPARISON:  11/10/2016 chest x-ray.  Chest CT 10/21/2016 FINDINGS: Cardiovascular: Heart is mildly enlarged. Prior mitral valve repair. Left dialysis catheter in place with the tip in the upper right atrium. Aorta is normal caliber. Mediastinum/Nodes: No mediastinal, hilar, or axillary adenopathy. Lungs/Pleura: Tracheostomy tube in place. Dependent opacity in the right lower lobe, atelectasis versus pneumonia. More dense consolidation in the left lower lobe concerning for pneumonia. No pleural effusions. Upper Abdomen: Imaging into the upper abdomen shows no acute findings. Layering high-density material in the gallbladder may reflect small stones. Musculoskeletal: Chest wall  soft tissues are unremarkable. No acute bony abnormality or focal bone lesion. IMPRESSION: Dense consolidation in the left lower lobe compatible with pneumonia. Right lower lobe atelectasis or infiltrate. Mild cardiomegaly. Tracheostomy tube in place. Question small layering stones in the gallbladder. Electronically Signed   By: Rolm Baptise M.D.   On: 11/18/2016 07:15   Medications: . sodium chloride 10 mL/hr at 11/18/16 0100  . feeding supplement (VITAL AF 1.2 CAL) 1,000 mL (11/18/16 0100)   . amiodarone  200 mg Per Tube Daily  . atorvastatin  20 mg Per Tube q1800  .  ceFAZolin (ANCEF) IV  1 g Intravenous Q24H  .  chlorhexidine gluconate (MEDLINE KIT)  15 mL Mouth Rinse BID  . clonazePAM  0.5 mg Per Tube BID  . clopidogrel  75 mg Per Tube Daily  . darbepoetin (ARANESP) injection - DIALYSIS  100 mcg Intravenous Q Sat-HD  . docusate  100 mg Per Tube BID  . ferric gluconate (FERRLECIT/NULECIT) IV  125 mg Intravenous Q T,Th,Sa-HD  . guaiFENesin-codeine  5 mL Per Tube Q6H  . insulin aspart  0-9 Units Subcutaneous Q4H  . mouth rinse  15 mL Mouth Rinse QID  . metoCLOPramide  10 mg Per Tube BID  . midodrine  10 mg Per Tube q morning - 10a  . multivitamin  1 tablet Oral QHS  . neomycin-bacitracin-polymyxin  1 application Topical Daily  . pantoprazole sodium  40 mg Per Tube Daily  . rifampin  300 mg Per Tube Q8H  . sennosides  5 mL Per Tube BID  . sodium chloride flush  3 mL Intravenous Q12H  . Warfarin - Pharmacist Dosing Inpatient   Does not apply q1800     Dialysis Orders: [facility] on TTS schedule. 4 hours, 2K/2.5Ca bath, no EDW (only 1 OP dialysis), prior L IJ (removed), now using LUE AVG  Assessment/Plan: 1. MSSA bacteremia/HD linesepsis: BCx 12/05 grew MSSA, s/p HD cath removal, using L AVG. ID following:On Cefazolin/Rifampin. Plan for TEE soon. 2. Vocal cord paralysis, s/p repeat trach12/7: Remains on vent/full support.  3. ESRD: ContinueTTS HD, juststarted on HD Sept 2017. Next HD 11/21/16.  4. Anemia: Hgb 9.8. Continue Aranesp weekly, increase dose 60 ->154mg. 5. MBD: Ca/Phos/PTH ok. No meds for now. 6. BP/Volume: OP EDW 56 kg. No edema on exam, run even on HD. CXR 12/8 without pulm edema. HD today. Net UF 500 post wt 55.7. Had issues with cramping. BP stable. May need to allow EDW to drift up.  7. Nutrition: Albumin 1.9.Tube feeds ongoing. Severe protein calorie malnutrition. Add prostat.   8. Hypophosphatemia: Phos 2.9. 9. Hypokalemia: 4K bath with HD. K+ 3.7 10. Hx MV replacement: Per primary. 11. Atrial fibrillation: Per primary. On amiodarone/warfarin. 12. DM: Per  primary   Rita H. Brown NP-C 11/18/2016, 2:10 PM  [facility] [phone] Pt seen, examined and agree w A/P as above.  RKelly SplinterMD CNewell Rubbermaidpager [phone]  11/18/2016, 3:37 PM

## 2016-11-18 NOTE — Progress Notes (Signed)
Speech Therapist has triggered a prescreen request for CIR.  With pt's current medical condition, CIR is not on the table.  Plan is likely to be vent SNF in [place] or [place].    [place] Admissions Coordinator Cell [phone] Office [phone]

## 2016-11-18 NOTE — Progress Notes (Signed)
Patient bleeding from central line insertion site.Pressure dressing applied.

## 2016-11-18 NOTE — Progress Notes (Addendum)
Palliative:  I checked in today with Deborah [name]. She is in dialysis session. Children Deborah [name] and Deborah [name] are at bedside. She is pleased with her trial of trach collar and PMV yesterday. No complaints. They continue to hope for further improvement. No further questions/concerns at this time. Palliative will continue to follow and shadow for any decline but at this time goals are clear.   No charge  Vinie Sill, NP Palliative Medicine Team Pager # [phone] (M-F 8a-5p) Team Phone # [phone] (Nights/Weekends)

## 2016-11-18 NOTE — Progress Notes (Signed)
ANTICOAGULATION CONSULT NOTE - Follow Up Consult  Pharmacy Consult for warfarin and cefazolin Indication: atrial fibrillation and bioprosthetic MVR, MSSA bacteremia   No Known Allergies  Patient Measurements: Height: 5\' 2"  (157.5 cm) Weight: 125 lb 3.5 oz (56.8 kg) IBW/kg (Calculated) : 50.1  Vital Signs: Temp: 97.8 F (36.6 C) (12/16 0845) Temp Source: Oral (12/16 0845) BP: 115/69 (12/16 1100) Pulse Rate: 95 (12/16 1100)  Labs:  Recent Labs  11/16/16 0500 11/17/16 0500 11/17/16 0945 11/18/16 0500 11/18/16 0757 11/18/16 0759  HGB 9.7*  --  9.8*  --   --  8.8*  HCT 31.3*  --  31.2*  --   --  28.1*  PLT 354  --  344  --   --  324  LABPROT 30.8* 39.8*  --  41.8*  --   --   INR 2.88 3.97  --  4.24*  --   --   HEPARINUNFRC 0.47  --   --   --   --   --   CREATININE 4.10*  --  3.64*  --  4.36*  --     Estimated Creatinine Clearance: 11 mL/min (by C-G formula based on SCr of 4.36 mg/dL (H)).   Medications:  Prescriptions Prior to Admission  Medication Sig Dispense Refill Last Dose  . amiodarone (PACERONE) 200 MG tablet Take 1 tablet (200 mg total) by mouth daily. 30 tablet 0 not started yet  . clopidogrel (PLAVIX) 75 MG tablet Take 1 tablet (75 mg total) by mouth daily. 30 tablet 0 not started yet  . guaiFENesin (ROBITUSSIN) 100 MG/5ML SOLN Take 30 mLs (600 mg total) by mouth every 6 (six) hours. 3000 mL 0 11/01/2016 at Unknown time  . albuterol (PROVENTIL) (2.5 MG/3ML) 0.083% nebulizer solution Take 3 mLs (2.5 mg total) by nebulization 4 (four) times daily - after meals and at bedtime. 150 mL 1 not startec  . ALPRAZolam (XANAX) 0.25 MG tablet Take 1 tablet (0.25 mg total) by mouth 2 (two) times daily as needed for anxiety. 30 tablet 0 not started  . atorvastatin (LIPITOR) 20 MG tablet Take 1 tablet (20 mg total) by mouth daily. 30 tablet 0 not started  . cefdinir (OMNICEF) 125 MG/5ML suspension Take 12 mLs (300 mg total) by mouth every other day. 48 mL 0 not started  .  hydrocortisone (ANUSOL-HC) 2.5 % rectal cream Place rectally 2 (two) times daily as needed for hemorrhoids or itching. 30 g 0 not started  . Insulin Detemir (LEVEMIR) 100 UNIT/ML Pen Inject 8 Units into the skin daily at 10 pm. 15 mL 0 not started  . lanthanum (FOSRENOL) 1000 MG chewable tablet Chew 1 tablet (1,000 mg total) by mouth 3 (three) times daily with meals. 90 tablet 0 not started  . metoCLOPramide (REGLAN) 5 MG tablet Take 1 tablet (5 mg total) by mouth 2 (two) times daily with a meal. 28 tablet 0 not started  . midodrine (PROAMATINE) 10 MG tablet Take 1 tablet (10 mg total) by mouth every morning. 30 tablet 0 not started  . multivitamin (RENA-VIT) TABS tablet Take 1 tablet by mouth at bedtime. 30 tablet 0 not started  . pantoprazole (PROTONIX) 40 MG tablet Take 1 tablet (40 mg total) by mouth 2 (two) times daily. 60 tablet 0 not started  . warfarin (COUMADIN) 2 MG tablet Take 2 mg on Tue, Thu, Sat, Sun 30 tablet 0 not started  . warfarin (COUMADIN) 4 MG tablet Take 4 mg on Mon, Wed, Fri with supper  30 tablet 0 not started    Assessment: 60 yo F admitted 11/02/2016 on warfarin PTA for Afib and bioprosthetic MVR. Pharmacy originally consulted for heparin drip but stopped 12/9 for bleeding around trach site. Restarted warfarin per pharmacy on 12/13 after G-tube placement. Patient also on interacting medications with rifampin (decreases INR) and amiodarone (increases INR). Warfarin dose held yesterday due to elevated INR.   INR 4.24 (while holding warfarin), Hgb trending down 8.8, Plt wnl. Pt has had bleeding from under central line dressing  PTA dose (from previous discharge): 4mg  M/W/F and 2mg  T/T/S/S   Patient also with MSSA bacteremia being treated with cefazolin and rifampin. Treatment duration is expected to be 6 weeks.  Patient is afebrile but WBC remains elevated at 16.8.   Goal of Therapy:  INR 2-3 Monitor platelets by anticoagulation protocol: Yes   Plan:  - Hold warfarin  tonight - Monitor daily INR, CBC and signs/symptoms of further bleeding - Continue cefazolin 1g IV q24h (HD sched not stable)  Dimitri Ped, PharmD, BCPS PGY-2 Infectious Diseases Pharmacy Resident Pager: [phone] 11/18/2016,11:08 AM

## 2016-11-18 NOTE — Progress Notes (Signed)
[place] TEAM 1 - Stepdown/ICU TEAM  Deborah [name]  XKG:[id number] DOB: August 16, 1956 DOA: 11/02/2016 PCP: Rica Mast, MD    Brief Narrative:  30-yo F Hx prolonged intubation requiring tracheostomy placement and subsequent decannulation, STEMI with Papillary Muscle Rupture requiring surgical repair in September, Atrial Flutter S/P cardioversion on Coumadin, HTN, DM2, HLD, and ESRD on HD (T/TH/Sat) who presented with labored breathing during her hemodialysis session.   Significant Events: 09/12 Admit to [facility] 09/15 Tx to [facility] 09/20 MVR with bioprosthetic valve 09/28 Permcath for HD 09/29 Extubated 09/30 Reintubated 10/05 Trach by Hyman Bible 10/09 Off vent 10/25 Decannulated 11/8 to CIR 11/29 DC from CIR 11/30 admit for resp distress 127 redo trach Tracheostomy 6.4 cuffed [name] - ENT)  12/09 L IJ Tunneled HD catheter removed  12/11 PEG tube in IR - L IJ double lumen cath in IR   Subjective: The patient experienced significant bleeding at the site of her central line insertion earlier this morning.  At the time of my visit this appears to have stopped.  She is on the ventilator at the time of visit but appears to be resting comfortably and denies any acute complaints.  Assessment & Plan:  Vocal cord paralysis S/P repeat tracheostomy by ENT 12/7 tracheostomy care and vent management per ENT/PCCM  HCAP No clinical symptoms to suggest persisting pulmonary infection  MSSA Bacteremia  Presumed HD cath related - HD cath out per ID - refused TEE therefore ID suggests 6 weeks of abx w/ repeat blood cx 2 weeks after course completed   ESRD on HD (T/TH/Sat) HD continues via AVG - Nephrology following   Hypokalemia Corrected   Hypomagnesemia Corrected  Paroxysmal Atrial Fibrillation Rate controlled - on amiodarone & warfain presently   Bioprosthetic Mitral valve ID requesting TEE - pt refused   Acute on Chronic Hypotension Cont midodrine - BP stable  Inferior STEMI  S/P DES x 05 August 2016 continue Plavix  DM2 9/12 A1c 13.3 - CBG quite variable - gently adjust insulin regimen and follow   Anxiety Appears controlled at the present time  DVT prophylaxis: Warfarin Code Status: FULL CODE Family Communication: Spoke with son at bedside  Disposition Plan: SDU  Consultants:  PCCM Nephrology  ID ENT  Antimicrobials:  Cefepime 12/6 - 12/9 Vancomycin 12/6 - 12/9 Rifapmin 12/7 > Cefazolin 12/9 >  Objective: Blood pressure (!) 94/57, pulse 88, temperature 97.8 F (36.6 C), temperature source Oral, resp. rate 18, height 5' 2"  (1.575 m), weight 55.7 kg (122 lb 12.7 oz), SpO2 100 %.  Intake/Output Summary (Last 24 hours) at 11/18/16 1553 Last data filed at 11/18/16 1400  Gross per 24 hour  Intake             1560 ml  Output              700 ml  Net              860 ml   Filed Weights   11/18/16 0450 11/18/16 0732 11/18/16 1135  Weight: 56 kg (123 lb 7.3 oz) 56.8 kg (125 lb 3.5 oz) 55.7 kg (122 lb 12.7 oz)    Examination: General: No acute respiratory distress on vent Lungs: Clear to auscultation bilaterally w/o wheeze  Cardiovascular: Regular rate and rhythm  Abdomen: Nontender, nondistended, soft Extremities: No significant cyanosis, clubbing, edema   CBC:  Recent Labs Lab 11/14/16 0523 11/15/16 0500 11/16/16 0500 11/17/16 0945 11/18/16 0759  WBC 13.6* 15.6* 15.2* 17.7* 16.8*  HGB 10.0* 10.0*  9.7* 9.8* 8.8*  HCT 31.9* 31.4* 31.3* 31.2* 28.1*  MCV 86.2 85.8 87.9 86.9 86.5  PLT 329 350 354 344 947   Basic Metabolic Panel:  Recent Labs Lab 11/12/16 0235 11/13/16 0300  11/14/16 0523 11/14/16 1845 11/15/16 0500 11/16/16 0500 11/17/16 0945 11/18/16 0757  NA 135 136  --  135  --  136 134* 133* 131*  K 3.1* 3.8  --  3.1* 3.9 3.6 3.2* 4.4 3.7  CL 95* 98*  --  96*  --  98* 95* 95* 95*  CO2 30 23  --  25  --  27 27 27 26   GLUCOSE 148* 46*  < > 115*  --  68 147* 290* 318*  BUN 19 26*  --  32*  --  8 13 11 19     CREATININE 3.03* 4.44*  --  5.76*  --  2.83* 4.10* 3.64* 4.36*  CALCIUM 8.6* 8.7*  --  8.5*  --  8.0* 7.8* 8.2* 7.9*  MG 1.7 1.7  --  1.7 2.0 2.1  --   --   --   PHOS 2.0* 1.7*  --  2.1*  --  1.2* 3.4  --  2.9  < > = values in this interval not displayed. GFR: Estimated Creatinine Clearance: 11 mL/min (by C-G formula based on SCr of 4.36 mg/dL (H)).  Liver Function Tests:  Recent Labs Lab 11/14/16 0523 11/15/16 0500 11/16/16 0500 11/17/16 0945 11/18/16 0757  AST  --   --   --  18  --   ALT  --   --   --  <5*  --   ALKPHOS  --   --   --  73  --   BILITOT  --   --   --  1.0  --   PROT  --   --   --  5.6*  --   ALBUMIN 1.9* 1.9* 1.8* 1.9* 1.7*    Coagulation Profile:  Recent Labs Lab 11/14/16 0523 11/15/16 0500 11/16/16 0500 11/17/16 0500 11/18/16 0500  INR 1.87 2.16 2.88 3.97 4.24*    HbA1C: Hgb A1c MFr Bld  Date/Time Value Ref Range Status  08/15/2016 01:38 PM 13.3 (H) 4.8 - 5.6 % Final    Comment:    (NOTE)         Pre-diabetes: 5.7 - 6.4         Diabetes: >6.4         Glycemic control for adults with diabetes: <7.0   07/09/2014 01:54 PM 7.6 (H) 4.6 - 6.5 % Final    Comment:    Glycemic Control Guidelines for People with Diabetes:Non Diabetic:  <6%Goal of Therapy: <7%Additional Action Suggested:  >8%     CBG:  Recent Labs Lab 11/17/16 2030 11/18/16 0035 11/18/16 0440 11/18/16 0743 11/18/16 1333  GLUCAP 250* 334* 233* 328* 155*    Recent Results (from the past 240 hour(s))  Culture, blood (Routine X 2) w Reflex to ID Panel     Status: None   Collection Time: 11/09/16  6:05 PM  Result Value Ref Range Status   Specimen Description BLOOD RIGHT HAND  Final   Special Requests BOTTLES DRAWN AEROBIC ONLY [place]  Final   Culture NO GROWTH 5 DAYS  Final   Report Status 11/14/2016 FINAL  Final  Cath Tip Culture     Status: None   Collection Time: 11/11/16  2:58 PM  Result Value Ref Range Status   Specimen Description CATH TIP  Final  Special Requests  NONE  Final   Culture NO GROWTH 3 DAYS  Final   Report Status 11/15/2016 FINAL  Final     Scheduled Meds: . amiodarone  200 mg Per Tube Daily  . atorvastatin  20 mg Per Tube q1800  .  ceFAZolin (ANCEF) IV  1 g Intravenous Q24H  . chlorhexidine gluconate (MEDLINE KIT)  15 mL Mouth Rinse BID  . clonazePAM  0.5 mg Per Tube BID  . clopidogrel  75 mg Per Tube Daily  . darbepoetin (ARANESP) injection - DIALYSIS  100 mcg Intravenous Q Sat-HD  . docusate  100 mg Per Tube BID  . feeding supplement (PRO-STAT SUGAR FREE 64)  30 mL Oral BID  . ferric gluconate (FERRLECIT/NULECIT) IV  125 mg Intravenous Q T,Th,Sa-HD  . guaiFENesin-codeine  5 mL Per Tube Q6H  . insulin aspart  0-9 Units Subcutaneous Q4H  . mouth rinse  15 mL Mouth Rinse QID  . metoCLOPramide  10 mg Per Tube BID  . midodrine  10 mg Per Tube q morning - 10a  . multivitamin  1 tablet Oral QHS  . neomycin-bacitracin-polymyxin  1 application Topical Daily  . pantoprazole sodium  40 mg Per Tube Daily  . rifampin  300 mg Per Tube Q8H  . sennosides  5 mL Per Tube BID  . sodium chloride flush  3 mL Intravenous Q12H  . Warfarin - Pharmacist Dosing Inpatient   Does not apply q1800   Continuous Infusions: . sodium chloride 10 mL/hr at 11/18/16 0100  . feeding supplement (VITAL AF 1.2 CAL) 1,000 mL (11/18/16 0100)     LOS: 16 days   Cherene Altes, MD [facility] Office  [phone] Pager - Text Page per Amion as per below:  On-Call/Text Page:      [email or web address]      password [email or web address]  If 7PM-7AM, please contact night-coverage [email or web address] Password [facility] 11/18/2016, 3:53 PM

## 2016-11-18 NOTE — Progress Notes (Signed)
Pt. Continues to bleed from under dressing.  Pressure gauze dressing applied over site.  Will continue to monitor

## 2016-11-19 LAB — GLUCOSE, CAPILLARY
GLUCOSE-CAPILLARY: 328 mg/dL — AB (ref 65–99)
Glucose-Capillary: 248 mg/dL — ABNORMAL HIGH (ref 65–99)
Glucose-Capillary: 297 mg/dL — ABNORMAL HIGH (ref 65–99)
Glucose-Capillary: 297 mg/dL — ABNORMAL HIGH (ref 65–99)
Glucose-Capillary: 298 mg/dL — ABNORMAL HIGH (ref 65–99)
Glucose-Capillary: 306 mg/dL — ABNORMAL HIGH (ref 65–99)

## 2016-11-19 LAB — CBC
HEMATOCRIT: 30 % — AB (ref 36.0–46.0)
Hemoglobin: 9.2 g/dL — ABNORMAL LOW (ref 12.0–15.0)
MCH: 27.1 pg (ref 26.0–34.0)
MCHC: 30.7 g/dL (ref 30.0–36.0)
MCV: 88.2 fL (ref 78.0–100.0)
Platelets: 325 10*3/uL (ref 150–400)
RBC: 3.4 MIL/uL — ABNORMAL LOW (ref 3.87–5.11)
RDW: 19.5 % — ABNORMAL HIGH (ref 11.5–15.5)
WBC: 17 10*3/uL — ABNORMAL HIGH (ref 4.0–10.5)

## 2016-11-19 LAB — PROTIME-INR
INR: 1.95
Prothrombin Time: 22.6 seconds — ABNORMAL HIGH (ref 11.4–15.2)

## 2016-11-19 MED ORDER — WARFARIN SODIUM 1 MG PO TABS
1.0000 mg | ORAL_TABLET | Freq: Once | ORAL | Status: AC
  Administered 2016-11-19: 1 mg via ORAL
  Filled 2016-11-19: qty 1

## 2016-11-19 MED ORDER — INSULIN DETEMIR 100 UNIT/ML ~~LOC~~ SOLN
10.0000 [IU] | Freq: Every day | SUBCUTANEOUS | Status: DC
  Administered 2016-11-20 (×2): 10 [IU] via SUBCUTANEOUS
  Filled 2016-11-19 (×4): qty 0.1

## 2016-11-19 NOTE — Progress Notes (Signed)
[place] TEAM 1 - Stepdown/ICU TEAM  Deborah [name]  [id number]:[id number] DOB: January 17, 1956 DOA: 11/02/2016 PCP: Rica Mast, MD    Brief Narrative:  71-yo F Hx prolonged intubation requiring tracheostomy placement and subsequent decannulation, STEMI with Papillary Muscle Rupture requiring surgical repair in September, Atrial Flutter S/P cardioversion on Coumadin, HTN, DM2, HLD, and ESRD on HD (T/TH/Sat) who presented with labored breathing during her hemodialysis session.   Significant Events: 09/12 Admit to [facility] 09/15 Tx to [facility] 09/20 MVR with bioprosthetic valve 09/28 Permcath for HD 09/29 Extubated 09/30 Reintubated 10/05 Trach by Hyman Bible 10/09 Off vent 10/25 Decannulated 11/8 to CIR 11/29 DC from CIR 11/30 admit for resp distress 127 redo trach Tracheostomy 6.4 cuffed [name] - ENT)  12/09 L IJ Tunneled HD catheter removed  12/11 PEG tube in IR - L IJ double lumen cath in IR   Subjective: The patient appears to be resting comfortably.  She denies any new complaints.  She is presently on trach collar and tolerating this well.  Assessment & Plan:  Vocal cord paralysis - repeat tracheostomy by ENT 12/7 tracheostomy care and vent management per ENT/PCCM  HCAP No clinical symptoms to suggest persisting pulmonary infection  MSSA Bacteremia  Presumed HD cath related - HD cath out per ID - refused TEE therefore ID suggests 6 weeks of abx w/ repeat blood cx 2 weeks after course completed   ESRD on HD (T/TH/Sat) HD continues via AVG - Nephrology following   Paroxysmal Atrial Fibrillation Rate controlled - on amiodarone & warfain presently   Bioprosthetic Mitral valve ID requested TEE - pt refused   Acute on Chronic Hypotension Cont midodrine - BP stable  Inferior STEMI S/P DES x 05 August 2016 continue Plavix  DM2 9/12 A1c 13.3 - CBG remains above goal - adjust treatment and follow trend  Anxiety Appears controlled at the present time  DVT prophylaxis:  Warfarin Code Status: FULL CODE Family Communication: Spoke with son at bedside  Disposition Plan: SDU  Consultants:  PCCM Nephrology  ID ENT  Antimicrobials:  Cefepime 12/6 - 12/9 Vancomycin 12/6 - 12/9 Rifapmin 12/7 > Cefazolin 12/9 >  Objective: Blood pressure 102/60, pulse 98, temperature 98.2 F (36.8 C), temperature source Oral, resp. rate (!) 27, height 5' 2"  (1.575 m), weight 59 kg (130 lb 1.1 oz), SpO2 100 %.  Intake/Output Summary (Last 24 hours) at 11/19/16 1526 Last data filed at 11/19/16 0900  Gross per 24 hour  Intake             1070 ml  Output                0 ml  Net             1070 ml   Filed Weights   11/18/16 0732 11/18/16 1135 11/19/16 0500  Weight: 56.8 kg (125 lb 3.5 oz) 55.7 kg (122 lb 12.7 oz) 59 kg (130 lb 1.1 oz)    Examination: General: No acute respiratory distress on TC  Lungs: Clear to auscultation bilaterally Cardiovascular: Regular rate and rhythm  Abdomen: Nontender, nondistended, soft - PEG insertion clean and dry  Extremities: No significant cyanosis, clubbing, edema   CBC:  Recent Labs Lab 11/15/16 0500 11/16/16 0500 11/17/16 0945 11/18/16 0759 11/19/16 0415  WBC 15.6* 15.2* 17.7* 16.8* 17.0*  HGB 10.0* 9.7* 9.8* 8.8* 9.2*  HCT 31.4* 31.3* 31.2* 28.1* 30.0*  MCV 85.8 87.9 86.9 86.5 88.2  PLT 350 354 344 324 782   Basic Metabolic  Panel:  Recent Labs Lab 11/13/16 0300  11/14/16 0523 11/14/16 1845 11/15/16 0500 11/16/16 0500 11/17/16 0945 11/18/16 0757  NA 136  --  135  --  136 134* 133* 131*  K 3.8  --  3.1* 3.9 3.6 3.2* 4.4 3.7  CL 98*  --  96*  --  98* 95* 95* 95*  CO2 23  --  25  --  27 27 27 26   GLUCOSE 46*  < > 115*  --  68 147* 290* 318*  BUN 26*  --  32*  --  8 13 11 19   CREATININE 4.44*  --  5.76*  --  2.83* 4.10* 3.64* 4.36*  CALCIUM 8.7*  --  8.5*  --  8.0* 7.8* 8.2* 7.9*  MG 1.7  --  1.7 2.0 2.1  --   --   --   PHOS 1.7*  --  2.1*  --  1.2* 3.4  --  2.9  < > = values in this interval not  displayed. GFR: Estimated Creatinine Clearance: 11 mL/min (by C-G formula based on SCr of 4.36 mg/dL (H)).  Liver Function Tests:  Recent Labs Lab 11/14/16 0523 11/15/16 0500 11/16/16 0500 11/17/16 0945 11/18/16 0757  AST  --   --   --  18  --   ALT  --   --   --  <5*  --   ALKPHOS  --   --   --  73  --   BILITOT  --   --   --  1.0  --   PROT  --   --   --  5.6*  --   ALBUMIN 1.9* 1.9* 1.8* 1.9* 1.7*    Coagulation Profile:  Recent Labs Lab 11/15/16 0500 11/16/16 0500 11/17/16 0500 11/18/16 0500 11/19/16 0415  INR 2.16 2.88 3.97 4.24* 1.95    HbA1C: Hgb A1c MFr Bld  Date/Time Value Ref Range Status  08/15/2016 01:38 PM 13.3 (H) 4.8 - 5.6 % Final    Comment:    (NOTE)         Pre-diabetes: 5.7 - 6.4         Diabetes: >6.4         Glycemic control for adults with diabetes: <7.0   07/09/2014 01:54 PM 7.6 (H) 4.6 - 6.5 % Final    Comment:    Glycemic Control Guidelines for People with Diabetes:Non Diabetic:  <6%Goal of Therapy: <7%Additional Action Suggested:  >8%     CBG:  Recent Labs Lab 11/18/16 2345 11/19/16 0029 11/19/16 0347 11/19/16 0806 11/19/16 1255  GLUCAP 253* 248* 328* 306* 297*    Recent Results (from the past 240 hour(s))  Culture, blood (Routine X 2) w Reflex to ID Panel     Status: None   Collection Time: 11/09/16  6:05 PM  Result Value Ref Range Status   Specimen Description BLOOD RIGHT HAND  Final   Special Requests BOTTLES DRAWN AEROBIC ONLY [place]  Final   Culture NO GROWTH 5 DAYS  Final   Report Status 11/14/2016 FINAL  Final  Cath Tip Culture     Status: None   Collection Time: 11/11/16  2:58 PM  Result Value Ref Range Status   Specimen Description CATH TIP  Final   Special Requests NONE  Final   Culture NO GROWTH 3 DAYS  Final   Report Status 11/15/2016 FINAL  Final     Scheduled Meds: . amiodarone  200 mg Per Tube Daily  . atorvastatin  20 mg Per Tube q1800  .  ceFAZolin (ANCEF) IV  1 g Intravenous Q24H  . chlorhexidine  gluconate (MEDLINE KIT)  15 mL Mouth Rinse BID  . clonazePAM  0.5 mg Per Tube BID  . clopidogrel  75 mg Per Tube Daily  . darbepoetin (ARANESP) injection - DIALYSIS  100 mcg Intravenous Q Sat-HD  . docusate  100 mg Per Tube BID  . feeding supplement (PRO-STAT SUGAR FREE 64)  30 mL Oral BID  . guaiFENesin-codeine  5 mL Per Tube Q6H  . insulin aspart  0-9 Units Subcutaneous Q4H  . insulin detemir  6 Units Subcutaneous QHS  . mouth rinse  15 mL Mouth Rinse QID  . metoCLOPramide  10 mg Per Tube BID  . midodrine  10 mg Per Tube q morning - 10a  . multivitamin  1 tablet Oral QHS  . neomycin-bacitracin-polymyxin  1 application Topical Daily  . pantoprazole sodium  40 mg Per Tube Daily  . rifampin  300 mg Per Tube Q8H  . sennosides  5 mL Per Tube BID  . sodium chloride flush  3 mL Intravenous Q12H  . warfarin  1 mg Oral ONCE-1800  . Warfarin - Pharmacist Dosing Inpatient   Does not apply q1800   Continuous Infusions: . sodium chloride 10 mL/hr at 11/19/16 0900  . feeding supplement (VITAL AF 1.2 CAL) 1,000 mL (11/19/16 0800)     LOS: 17 days   Cherene Altes, MD [facility] Office  [phone] Pager - Text Page per Amion as per below:  On-Call/Text Page:      [email or web address]      password [email or web address]  If 7PM-7AM, please contact night-coverage [email or web address] Password [email or web address] 11/19/2016, 3:26 PM

## 2016-11-19 NOTE — Progress Notes (Signed)
ANTICOAGULATION CONSULT NOTE - Follow Up Consult  Pharmacy Consult for warfarin  Indication: atrial fibrillation and bioprosthetic MVR  No Known Allergies  Patient Measurements: Height: 5\' 2"  (157.5 cm) Weight: 130 lb 1.1 oz (59 kg) IBW/kg (Calculated) : 50.1  Vital Signs: Temp: 98.4 F (36.9 C) (12/17 0800) Temp Source: Oral (12/17 0800) BP: 117/89 (12/17 0800) Pulse Rate: 109 (12/17 0800)  Labs:  Recent Labs  11/17/16 0500  11/17/16 0945 11/18/16 0500 11/18/16 0757 11/18/16 0759 11/19/16 0415  HGB  --   < > 9.8*  --   --  8.8* 9.2*  HCT  --   --  31.2*  --   --  28.1* 30.0*  PLT  --   --  344  --   --  324 325  LABPROT 39.8*  --   --  41.8*  --   --  22.6*  INR 3.97  --   --  4.24*  --   --  1.95  CREATININE  --   --  3.64*  --  4.36*  --   --   < > = values in this interval not displayed.  Estimated Creatinine Clearance: 11 mL/min (by C-G formula based on SCr of 4.36 mg/dL (H)).   Medications:  Prescriptions Prior to Admission  Medication Sig Dispense Refill Last Dose  . amiodarone (PACERONE) 200 MG tablet Take 1 tablet (200 mg total) by mouth daily. 30 tablet 0 not started yet  . clopidogrel (PLAVIX) 75 MG tablet Take 1 tablet (75 mg total) by mouth daily. 30 tablet 0 not started yet  . guaiFENesin (ROBITUSSIN) 100 MG/5ML SOLN Take 30 mLs (600 mg total) by mouth every 6 (six) hours. 3000 mL 0 11/01/2016 at Unknown time  . albuterol (PROVENTIL) (2.5 MG/3ML) 0.083% nebulizer solution Take 3 mLs (2.5 mg total) by nebulization 4 (four) times daily - after meals and at bedtime. 150 mL 1 not startec  . ALPRAZolam (XANAX) 0.25 MG tablet Take 1 tablet (0.25 mg total) by mouth 2 (two) times daily as needed for anxiety. 30 tablet 0 not started  . atorvastatin (LIPITOR) 20 MG tablet Take 1 tablet (20 mg total) by mouth daily. 30 tablet 0 not started  . cefdinir (OMNICEF) 125 MG/5ML suspension Take 12 mLs (300 mg total) by mouth every other day. 48 mL 0 not started  .  hydrocortisone (ANUSOL-HC) 2.5 % rectal cream Place rectally 2 (two) times daily as needed for hemorrhoids or itching. 30 g 0 not started  . Insulin Detemir (LEVEMIR) 100 UNIT/ML Pen Inject 8 Units into the skin daily at 10 pm. 15 mL 0 not started  . lanthanum (FOSRENOL) 1000 MG chewable tablet Chew 1 tablet (1,000 mg total) by mouth 3 (three) times daily with meals. 90 tablet 0 not started  . metoCLOPramide (REGLAN) 5 MG tablet Take 1 tablet (5 mg total) by mouth 2 (two) times daily with a meal. 28 tablet 0 not started  . midodrine (PROAMATINE) 10 MG tablet Take 1 tablet (10 mg total) by mouth every morning. 30 tablet 0 not started  . multivitamin (RENA-VIT) TABS tablet Take 1 tablet by mouth at bedtime. 30 tablet 0 not started  . pantoprazole (PROTONIX) 40 MG tablet Take 1 tablet (40 mg total) by mouth 2 (two) times daily. 60 tablet 0 not started  . warfarin (COUMADIN) 2 MG tablet Take 2 mg on Tue, Thu, Sat, Sun 30 tablet 0 not started  . warfarin (COUMADIN) 4 MG tablet Take 4  mg on Mon, Wed, Fri with supper 30 tablet 0 not started    Assessment: 60 yo F admitted 11/02/2016 on warfarin PTA for Afib and bioprosthetic MVR. Pharmacy originally consulted for heparin drip but stopped 12/9 for bleeding around trach site. Restarted warfarin per pharmacy on 12/13 after G-tube placement. Patient also on interacting medications with rifampin (decreases INR) and amiodarone (increases INR). Warfarin dose held yesterday due to elevated INR.   INR decreased significantly down to 1.95 from 4.24 (while holding warfarin), Hgb trending down 9.2, Plt wnl. Pt had bleeding from under central line dressing yesterday but RN states the bleeding has stopped completely today.   PTA dose (from previous discharge): 4mg  M/W/F and 2mg  T/T/S/S   Goal of Therapy:  INR 2-3 Monitor platelets by anticoagulation protocol: Yes   Plan:  - Warfarin 1 mg x1 tonight - Monitor daily INR, CBC and signs/symptoms of further bleeding    Dimitri Ped, PharmD, BCPS PGY-2 Infectious Diseases Pharmacy Resident Pager: [phone] 11/19/2016,10:55 AM

## 2016-11-19 NOTE — Progress Notes (Signed)
[facility] Progress Note   Subjective: no new problems overnight. On trach collar now, attempting to wean gradually   Objective Vitals:   11/19/16 0757 11/19/16 0800 11/19/16 1124 11/19/16 1200  BP:  117/89  102/60  Pulse: (!) 103 (!) 109 100 98  Resp: (!) 27 (!) 23 (!) 29 (!) 27  Temp:  98.4 F (36.9 C)  98.2 F (36.8 C)  TempSrc:  Oral  Oral  SpO2: 100% 100% 100% 100%  Weight:      Height:       Physical Exam General: Awake, alert, trach to vent. Communicates via nods (yes/no) to questions. Some lip speak.  Heart: S1,S2, RRR. SR on monitor Lungs: Trach to vent adeq cuff inflation, sym. Chest excursions. CTA A/P. Full vent support at present.  Abdomen: Soft, non-tender. Peg with TF.  Extremities: No LE edema. Dialysis Access: LUA AVG + bruit.   Dialysis Orders: Deborah [name] on TTS schedule. 4 hours, 2K/2.5Ca bath, no EDW (only 1 OP dialysis), prior L IJ (removed), now using LUE AVG  Assessment: 1. MSSA bacteremia/HD linesepsis: s/p HD cath removal, using L AVG. Refused TEE, empiric 6 wks IV abx per ID 2. Vocal cord paralysis, s/p repeat trach12/7: Remains on vent, weaning now  3. 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 No signs of recovery. HD Tuesday 4. Anemia: Hgb 9.8. Continue Aranesp weekly, increase dose 60 ->142mg. 5. MBD: Ca/Phos/PTH ok. No meds for now. 6. BP/Volume: OP EDW 56 kg. No edema on exam, run even on HD. CXR 12/8 without pulm edema. HD today. Net UF 500 post wt 55.7. Had issues with cramping. BP stable. May need to allow EDW to drift up.  7. Nutrition: Albumin 1.9.Tube feeds ongoing. Severe protein calorie malnutrition. Add prostat.   8. Hypophosphatemia: Phos 2.9. 9. Hypokalemia: 4K bath with HD. K+ 3.7 10. 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 Atrial fibrillation: Per primary. On amiodarone/warfarin. 12. DM: Per primary  [facility] [phone]  11/19/2016, 12:52 PM    Additional Objective Labs: Basic Metabolic Panel:  Recent Labs Lab 11/15/16 0500 11/16/16 0500 11/17/16 0945 11/18/16 0757  NA 136 134* 133* 131*  K 3.6 3.2* 4.4 3.7  CL 98* 95* 95* 95*  CO2 27 27 27 26   GLUCOSE 68 147* 290* 318*  BUN 8 13 11 19   CREATININE 2.83* 4.10* 3.64* 4.36*  CALCIUM 8.0* 7.8* 8.2* 7.9*  PHOS 1.2* 3.4  --  2.9   Liver Function Tests:  Recent Labs Lab 11/16/16 0500 11/17/16 0945 11/18/16 0757  AST  --  18  --   ALT  --  <5*  --   ALKPHOS  --  73  --   BILITOT  --  1.0  --   PROT  --  5.6*  --   ALBUMIN 1.8* 1.9* 1.7*   No results for input(s): LIPASE, AMYLASE in the last 168 hours. CBC:  Recent Labs Lab 11/15/16 0500 11/16/16 0500 11/17/16 0945 11/18/16 0759 11/19/16 0415  WBC 15.6* 15.2* 17.7* 16.8* 17.0*  HGB 10.0* 9.7* 9.8* 8.8* 9.2*  HCT 31.4* 31.3* 31.2* 28.1* 30.0*  MCV 85.8 87.9 86.9 86.5 88.2  PLT 350 354 344 324 325   Blood Culture    Component Value Date/Time   SDES CATH TIP 11/11/2016 1458  SPECREQUEST NONE 11/11/2016 1458   CULT NO GROWTH 3 DAYS 11/11/2016 1458   REPTSTATUS 11/15/2016 FINAL 11/11/2016 1458    Cardiac Enzymes: No results for input(s): CKTOTAL, CKMB, CKMBINDEX, TROPONINI in the last 168 hours. CBG:  Recent Labs Lab 11/18/16 2024 11/18/16 2345 11/19/16 0029 11/19/16 0347 11/19/16 0806  GLUCAP 313* 253* 248* 328* 306*   Iron Studies: No results for input(s): IRON, TIBC, TRANSFERRIN, FERRITIN in the last 72 hours. @lablastinr3 @ Studies/Results: Ct Chest Wo Contrast  Result Date: 11/18/2016 CLINICAL DATA:  Hospital acquired pneumonia.  Ventilator. EXAM: CT CHEST WITHOUT CONTRAST TECHNIQUE: Multidetector CT imaging of the chest was performed following the standard protocol without IV contrast. COMPARISON:  11/10/2016 chest x-ray.  Chest CT 10/21/2016 FINDINGS: Cardiovascular: Heart is  mildly enlarged. Prior mitral valve repair. Left dialysis catheter in place with the tip in the upper right atrium. Aorta is normal caliber. Mediastinum/Nodes: No mediastinal, hilar, or axillary adenopathy. Lungs/Pleura: Tracheostomy tube in place. Dependent opacity in the right lower lobe, atelectasis versus pneumonia. More dense consolidation in the left lower lobe concerning for pneumonia. No pleural effusions. Upper Abdomen: Imaging into the upper abdomen shows no acute findings. Layering high-density material in the gallbladder may reflect small stones. Musculoskeletal: Chest wall soft tissues are unremarkable. No acute bony abnormality or focal bone lesion. IMPRESSION: Dense consolidation in the left lower lobe compatible with pneumonia. Right lower lobe atelectasis or infiltrate. Mild cardiomegaly. Tracheostomy tube in place. Question small layering stones in the gallbladder. Electronically Signed   By: Rolm Baptise M.D.   On: 11/18/2016 07:15   Medications: . sodium chloride 10 mL/hr at 11/19/16 0900  . feeding supplement (VITAL AF 1.2 CAL) 1,000 mL (11/19/16 0800)   . amiodarone  200 mg Per Tube Daily  . atorvastatin  20 mg Per Tube q1800  .  ceFAZolin (ANCEF) IV  1 g Intravenous Q24H  . chlorhexidine gluconate (MEDLINE KIT)  15 mL Mouth Rinse BID  . clonazePAM  0.5 mg Per Tube BID  . clopidogrel  75 mg Per Tube Daily  . darbepoetin (ARANESP) injection - DIALYSIS  100 mcg Intravenous Q Sat-HD  . docusate  100 mg Per Tube BID  . feeding supplement (PRO-STAT SUGAR FREE 64)  30 mL Oral BID  . guaiFENesin-codeine  5 mL Per Tube Q6H  . insulin aspart  0-9 Units Subcutaneous Q4H  . insulin detemir  6 Units Subcutaneous QHS  . mouth rinse  15 mL Mouth Rinse QID  . metoCLOPramide  10 mg Per Tube BID  . midodrine  10 mg Per Tube q morning - 10a  . multivitamin  1 tablet Oral QHS  . neomycin-bacitracin-polymyxin  1 application Topical Daily  . pantoprazole sodium  40 mg Per Tube Daily  .  rifampin  300 mg Per Tube Q8H  . sennosides  5 mL Per Tube BID  . sodium chloride flush  3 mL Intravenous Q12H  . warfarin  1 mg Oral ONCE-1800  . Warfarin - Pharmacist Dosing Inpatient   Does not apply [phone]

## 2016-11-20 LAB — GLUCOSE, CAPILLARY
GLUCOSE-CAPILLARY: 317 mg/dL — AB (ref 65–99)
GLUCOSE-CAPILLARY: 292 mg/dL — AB (ref 65–99)
GLUCOSE-CAPILLARY: 373 mg/dL — AB (ref 65–99)
GLUCOSE-CAPILLARY: 267 mg/dL — AB (ref 65–99)
Glucose-Capillary: 227 mg/dL — ABNORMAL HIGH (ref 65–99)
Glucose-Capillary: 293 mg/dL — ABNORMAL HIGH (ref 65–99)
Glucose-Capillary: 397 mg/dL — ABNORMAL HIGH (ref 65–99)

## 2016-11-20 LAB — PROTIME-INR
INR: 1.41
PROTHROMBIN TIME: 17.4 s — AB (ref 11.4–15.2)

## 2016-11-20 MED ORDER — WARFARIN SODIUM 3 MG PO TABS
3.0000 mg | ORAL_TABLET | Freq: Once | ORAL | Status: AC
  Administered 2016-11-20: 3 mg via ORAL
  Filled 2016-11-20: qty 1

## 2016-11-20 NOTE — Progress Notes (Signed)
Physical Therapy Treatment Patient Details Name: Deborah [name] MRN: [id number] DOB: [birth year] Today's Date: 11/20/2016    History of Present Illness Pt is a 60 y/o female admitted to [facility] on 11/30 secondary to dyspnea after being discharged on 11/29 from CIR. Pt was admitted to CIR from 11/8 through 11/29. PMH including but not limited to A-fib/flutter, DM, ESRD, and CAD s/p STEMI in September (08/15/16). Pt was intubated on 9/30 with trach placement on 10/5. She progressed to weaning off of vent on 10/09 and decannulated on 10/25. She has since had to have a second trach placement this admission on 11/09/16.    PT Comments    Pt more independent with bed mobility today, was able to get to EOB with min-guard A. However, pt was hopeful to ambulate but knees buckling R>L in standing and could only transfer bed to chair with +2 mod A. There ex performed in chair. Pt very flat throughout session without eye contact. PT will continue to follow.   Follow Up Recommendations  SNF     Equipment Recommendations  None recommended by PT    Recommendations for Other Services       Precautions / Restrictions Precautions Precautions: Fall Precaution Comments: trach, PEG Restrictions Weight Bearing Restrictions: No    Mobility  Bed Mobility Overal bed mobility: Needs Assistance Bed Mobility: Supine to Sit     Supine to sit: Min guard     General bed mobility comments: pt able to initiate bed mobility and get to EOB without physical assist, min-guard to manage lines and guard pt as she scooted to EOB  Transfers Overall transfer level: Needs assistance Equipment used: Rolling walker (2 wheeled) Transfers: Sit to/from [facility] Sit to Stand: Mod assist;+2 physical assistance Stand pivot transfers: Mod assist;+2 physical assistance       General transfer comment: pt stood to RW with mod A +2 and fist time, right knee buckled and pt sat back down to bed. Pt had been  hopeful of ambulating but could not maintain standing long enough to ambulate. So SPT to recliner with +2 mod A and small pivot steps with RW  Ambulation/Gait             General Gait Details: knees buckling in standing, unable to ambulate safely   Stairs            Wheelchair Mobility    Modified Rankin (Stroke Patients Only)       Balance Overall balance assessment: Needs assistance Sitting-balance support: Feet supported;No upper extremity supported Sitting balance-Leahy Scale: Fair     Standing balance support: During functional activity;Bilateral upper extremity supported Standing balance-Leahy Scale: Poor Standing balance comment: reliant on RW                    Cognition Arousal/Alertness: Awake/alert Behavior During Therapy: Flat affect Overall Cognitive Status: Difficult to assess                 General Comments: pt very flat, does not make eye contact, does not attempt to communicate with therapists. Daughter also very flat and seems scared.     Exercises General Exercises - Lower Extremity Quad Sets: AROM;Both;10 reps;Seated Gluteal Sets: AROM;Both;10 reps;Seated Hip ABduction/ADduction: AROM;Both;5 reps;Seated Straight Leg Raises: AAROM;Both;10 reps;Seated    General Comments General comments (skin integrity, edema, etc.): O2 sats remained 100' on 5L O2, O2 off for short period for pivot to chair and O2 sats remained 99%  Pertinent Vitals/Pain Pain Assessment: No/denies pain    Home Living                      Prior Function            PT Goals (current goals can now be found in the care plan section) Acute Rehab PT Goals Patient Stated Goal: none stated PT Goal Formulation: With patient Time For Goal Achievement: 11/29/16 Potential to Achieve Goals: Fair Progress towards PT goals: Progressing toward goals    Frequency    Min 3X/week      PT Plan Current plan remains appropriate     Co-evaluation             End of Session Equipment Utilized During Treatment: Gait belt;Oxygen Activity Tolerance: Patient limited by fatigue Patient left: in chair;with call bell/phone within reach;with family/visitor present     Time: 1141-1210 PT Time Calculation (min) (ACUTE ONLY): 29 min  Charges:  $Therapeutic Activity: 23-37 mins                    G Codes:     Leighton Roach, PT  Acute Rehab Services  [place] 11/20/2016, 12:47 PM

## 2016-11-20 NOTE — Progress Notes (Signed)
[facility] PROGRESS NOTE    Progress Note  Deborah [name]  GYF:[id number] DOB: [birth year] DOA: 11/02/2016 PCP: Rica Mast, MD     Brief Narrative:   Deborah [name] is an 60 y.o. female Hxprolonged intubation requiring tracheostomy placement and subsequent decannulation,STEMI with Papillary Muscle Rupture requiring surgical repair in September, Atrial Flutter S/P cardioversion on Coumadin, HTN, DM2, HLD, and ESRD on HD (T/TH/Sat) who presented with labored breathing during her hemodialysis session  Assessment/Plan:   Significant Events: 09/12 Admit to [facility] 09/15 Tx to [facility] 09/20 MVR with bioprosthetic valve 09/28 Permcath for HD 09/29 Extubated 09/30 Reintubated 10/05 Trach by Hyman Bible 10/09 Off vent 10/25 Decannulated 11/8 to CIR 11/29 DC from CIR 11/30 admit for resp distress 127 redo trach Tracheostomy 6.4 cuffed [name] - ENT)  12/09 L IJ Tunneled HD catheter removed  12/11 PEG tube in IR - L IJ double lumen cath in IR   Subjective: The patient appears to be resting comfortably.  She denies any new complaints.  She is presently on trach collar and tolerating this well.  Assessment & Plan:  Vocal cord paralysis - repeat tracheostomy by ENT 12/7 Tracheostomy care and vent management per ENT/PCCM, PCCM to attempt ATC overnight. Tequiring tracheostomy on 12/7 by  Dr. [name], and a PEG tube placement on 12/11. PT eval pending, family resistate to SNF if needed.  HCAP No clinical symptoms to suggest persisting pulmonary infection  MSSA Bacteremia  Presumed HD cath related - HD cath out per ID - refused TEE therefore ID suggests 6 weeks of abx w/ repeat blood cx 2 weeks after course completed .  ESRD on HD (T/TH/Sat) HD continues via AVG - Nephrology following   Paroxysmal Atrial Fibrillation Rate controlled - on amiodarone & warfain presently   Bioprosthetic Mitral valve ID requested TEE - pt refused   Acute on Chronic Hypotension Cont midodrine -  BP stable  Inferior STEMI S/P DES x 05 August 2016 continue Plavix  DM2 9/12 A1c 13.3 - CBG remains above goal - adjust treatment and follow trend  Anxiety Appears controlled at the present time    DVT prophylaxis: coumadin Family Communication:None Disposition Plan/Barrier to D/C: hopefully in 1-2 days Code Status:     Code Status Orders        Start     Ordered   11/14/16 1348  Full code  Continuous     11/14/16 1347    Code Status History    Date Active Date Inactive Code Status Order ID Comments User Context   11/02/2016  9:16 PM 11/14/2016  1:47 PM Full Code [id number]  Corey Harold, NP ED   10/11/2016  4:35 PM 11/01/2016 10:00 PM Full Code [id number]  Bary Leriche, PA-C Inpatient   10/11/2016  4:35 PM 10/11/2016  4:35 PM Full Code [id number]  Bary Leriche, PA-C Inpatient   08/18/2016  2:56 PM 08/31/2016  8:38 AM Full Code [id number]  Shirley Friar, PA-C Inpatient   08/17/2016  7:58 AM 08/18/2016 11:51 AM Full Code [id number]  Flora Lipps, MD Inpatient   08/15/2016  4:16 PM 08/15/2016  7:06 PM Full Code [id number]  Yolonda Kida, MD Inpatient        IV Access:    Peripheral IV   Procedures and diagnostic studies:   No results found.   Medical Consultants:    None.  Anti-Infectives:   Ancef 12.5.2017>> will need 6 week  Subjective:    Deborah [name] No verbal point  with hand she is doing better.  Objective:    Vitals:   11/20/16 0414 11/20/16 0800 11/20/16 0819 11/20/16 0834  BP:  95/64 95/64 95/64  Pulse:  99 100   Resp:  16 (!) 21   Temp:    97.8 F (36.6 C)  TempSrc:    Oral  SpO2:  100% 100%   Weight: 60 kg (132 lb 4.4 oz)     Height:        Intake/Output Summary (Last 24 hours) at 11/20/16 1153 Last data filed at 11/20/16 0800  Gross per 24 hour  Intake              630 ml  Output                0 ml  Net              630 ml   Filed Weights   11/19/16 0500 11/20/16 0000 11/20/16 0414  Weight: 59 kg (130 lb 1.1 oz)  60 kg (132 lb 4.4 oz) 60 kg (132 lb 4.4 oz)    Exam: General exam: In no acute distress.Trach in place Respiratory system: Good air movement and clear to auscultation. Cardiovascular system: S1 & S2 heard, RRR. No JVD. Gastrointestinal system: Abdomen is nondistended, soft and nontender.  Central nervous system: Alert and oriented. No focal neurological deficits. Severely weakened upper and lower extremities. Extremities: No pedal edema. Skin: No rashes, lesions or ulcers    Data Reviewed:    Labs: Basic Metabolic Panel:  Recent Labs Lab 11/14/16 0523 11/14/16 1845 11/15/16 0500 11/16/16 0500 11/17/16 0945 11/18/16 0757  NA 135  --  136 134* 133* 131*  K 3.1* 3.9 3.6 3.2* 4.4 3.7  CL 96*  --  98* 95* 95* 95*  CO2 25  --  27 27 27 26  GLUCOSE 115*  --  68 147* 290* 318*  BUN 32*  --  8 13 11 19  CREATININE 5.76*  --  2.83* 4.10* 3.64* 4.36*  CALCIUM 8.5*  --  8.0* 7.8* 8.2* 7.9*  MG 1.7 2.0 2.1  --   --   --   PHOS 2.1*  --  1.2* 3.4  --  2.9   GFR Estimated Creatinine Clearance: 11 mL/min (by C-G formula based on SCr of 4.36 mg/dL (H)). Liver Function Tests:  Recent Labs Lab 11/14/16 0523 11/15/16 0500 11/16/16 0500 11/17/16 0945 11/18/16 0757  AST  --   --   --  18  --   ALT  --   --   --  <5*  --   ALKPHOS  --   --   --  73  --   BILITOT  --   --   --  1.0  --   PROT  --   --   --  5.6*  --   ALBUMIN 1.9* 1.9* 1.8* 1.9* 1.7*   No results for input(s): LIPASE, AMYLASE in the last 168 hours. No results for input(s): AMMONIA in the last 168 hours. Coagulation profile  Recent Labs Lab 11/16/16 0500 11/17/16 0500 11/18/16 0500 11/19/16 0415 11/20/16 0414  INR 2.88 3.97 4.24* 1.95 1.41    CBC:  Recent Labs Lab 11/15/16 0500 11/16/16 0500 11/17/16 0945 11/18/16 0759 11/19/16 0415  WBC 15.6* 15.2* 17.7* 16.8* 17.0*  HGB 10.0* 9.7* 9.8* 8.8* 9.2*  HCT 31.4* 31.3* 31.2* 28.1* 30.0*  MCV 85.8 87.9 86.9 86.5 88.2  PLT 350 354 344 324 325      Cardiac Enzymes: No results for input(s): CKTOTAL, CKMB, CKMBINDEX, TROPONINI in the last 168 hours. BNP (last 3 results) No results for input(s): PROBNP in the last 8760 hours. CBG:  Recent Labs Lab 11/19/16 1701 11/19/16 1944 11/20/16 0012 11/20/16 0400 11/20/16 0830  GLUCAP 297* 298* 397* 317* 293*   D-Dimer: No results for input(s): DDIMER in the last 72 hours. Hgb A1c: No results for input(s): HGBA1C in the last 72 hours. Lipid Profile: No results for input(s): CHOL, HDL, LDLCALC, TRIG, CHOLHDL, LDLDIRECT in the last 72 hours. Thyroid function studies: No results for input(s): TSH, T4TOTAL, T3FREE, THYROIDAB in the last 72 hours.  Invalid input(s): FREET3 Anemia work up: No results for input(s): VITAMINB12, FOLATE, FERRITIN, TIBC, IRON, RETICCTPCT in the last 72 hours. Sepsis Labs:  Recent Labs Lab 11/16/16 0500 11/17/16 0945 11/18/16 0759 11/19/16 0415  WBC 15.2* 17.7* 16.8* 17.0*   Microbiology Recent Results (from the past 240 hour(s))  Cath Tip Culture     Status: None   Collection Time: 11/11/16  2:58 PM  Result Value Ref Range Status   Specimen Description CATH TIP  Final   Special Requests NONE  Final   Culture NO GROWTH 3 DAYS  Final   Report Status 11/15/2016 FINAL  Final     Medications:   . amiodarone  200 mg Per Tube Daily  . atorvastatin  20 mg Per Tube q1800  .  ceFAZolin (ANCEF) IV  1 g Intravenous Q24H  . chlorhexidine gluconate (MEDLINE KIT)  15 mL Mouth Rinse BID  . clonazePAM  0.5 mg Per Tube BID  . clopidogrel  75 mg Per Tube Daily  . darbepoetin (ARANESP) injection - DIALYSIS  100 mcg Intravenous Q Sat-HD  . docusate  100 mg Per Tube BID  . feeding supplement (PRO-STAT SUGAR FREE 64)  30 mL Oral BID  . guaiFENesin-codeine  5 mL Per Tube Q6H  . insulin aspart  0-9 Units Subcutaneous Q4H  . insulin detemir  10 Units Subcutaneous QHS  . mouth rinse  15 mL Mouth Rinse QID  . metoCLOPramide  10 mg Per Tube BID  . midodrine  10  mg Per Tube q morning - 10a  . multivitamin  1 tablet Oral QHS  . pantoprazole sodium  40 mg Per Tube Daily  . rifampin  300 mg Per Tube Q8H  . sennosides  5 mL Per Tube BID  . sodium chloride flush  3 mL Intravenous Q12H  . warfarin  3 mg Oral ONCE-1800  . Warfarin - Pharmacist Dosing Inpatient   Does not apply q1800   Continuous Infusions: . feeding supplement (VITAL AF 1.2 CAL) 1,000 mL (11/20/16 0800)    Time spent: 25 min   LOS: 18 days   Charlynne Cousins  [facility] Pager [phone]  *Please refer to [email or web address], password [email or web address] to get updated schedule on who will round on this patient, as hospitalists switch teams weekly. If 7PM-7AM, please contact night-coverage at [email or web address], password [email or web address] for any overnight needs.  11/20/2016, 11:53 AM

## 2016-11-20 NOTE — Progress Notes (Signed)
ANTICOAGULATION CONSULT NOTE - Follow Up Consult  Pharmacy Consult for warfarin  Indication: atrial fibrillation and bioprosthetic MVR  No Known Allergies  Patient Measurements: Height: 5\' 2"  (157.5 cm) Weight: 132 lb 4.4 oz (60 kg) IBW/kg (Calculated) : 50.1  Vital Signs: Temp: 97.9 F (36.6 C) (12/18 0400) Temp Source: Oral (12/18 0400) BP: 98/68 (12/18 0400) Pulse Rate: 96 (12/18 0400)  Labs:  Recent Labs  11/17/16 0945 11/18/16 0500 11/18/16 0757 11/18/16 0759 11/19/16 0415 11/20/16 0414  HGB 9.8*  --   --  8.8* 9.2*  --   HCT 31.2*  --   --  28.1* 30.0*  --   PLT 344  --   --  324 325  --   LABPROT  --  41.8*  --   --  22.6* 17.4*  INR  --  4.24*  --   --  1.95 1.41  CREATININE 3.64*  --  4.36*  --   --   --     Estimated Creatinine Clearance: 11 mL/min (by C-G formula based on SCr of 4.36 mg/dL (H)).   Medications:  Prescriptions Prior to Admission  Medication Sig Dispense Refill Last Dose  . amiodarone (PACERONE) 200 MG tablet Take 1 tablet (200 mg total) by mouth daily. 30 tablet 0 not started yet  . clopidogrel (PLAVIX) 75 MG tablet Take 1 tablet (75 mg total) by mouth daily. 30 tablet 0 not started yet  . guaiFENesin (ROBITUSSIN) 100 MG/5ML SOLN Take 30 mLs (600 mg total) by mouth every 6 (six) hours. 3000 mL 0 11/01/2016 at Unknown time  . albuterol (PROVENTIL) (2.5 MG/3ML) 0.083% nebulizer solution Take 3 mLs (2.5 mg total) by nebulization 4 (four) times daily - after meals and at bedtime. 150 mL 1 not startec  . ALPRAZolam (XANAX) 0.25 MG tablet Take 1 tablet (0.25 mg total) by mouth 2 (two) times daily as needed for anxiety. 30 tablet 0 not started  . atorvastatin (LIPITOR) 20 MG tablet Take 1 tablet (20 mg total) by mouth daily. 30 tablet 0 not started  . cefdinir (OMNICEF) 125 MG/5ML suspension Take 12 mLs (300 mg total) by mouth every other day. 48 mL 0 not started  . hydrocortisone (ANUSOL-HC) 2.5 % rectal cream Place rectally 2 (two) times daily as  needed for hemorrhoids or itching. 30 g 0 not started  . Insulin Detemir (LEVEMIR) 100 UNIT/ML Pen Inject 8 Units into the skin daily at 10 pm. 15 mL 0 not started  . lanthanum (FOSRENOL) 1000 MG chewable tablet Chew 1 tablet (1,000 mg total) by mouth 3 (three) times daily with meals. 90 tablet 0 not started  . metoCLOPramide (REGLAN) 5 MG tablet Take 1 tablet (5 mg total) by mouth 2 (two) times daily with a meal. 28 tablet 0 not started  . midodrine (PROAMATINE) 10 MG tablet Take 1 tablet (10 mg total) by mouth every morning. 30 tablet 0 not started  . multivitamin (RENA-VIT) TABS tablet Take 1 tablet by mouth at bedtime. 30 tablet 0 not started  . pantoprazole (PROTONIX) 40 MG tablet Take 1 tablet (40 mg total) by mouth 2 (two) times daily. 60 tablet 0 not started  . warfarin (COUMADIN) 2 MG tablet Take 2 mg on Tue, Thu, Sat, Sun 30 tablet 0 not started  . warfarin (COUMADIN) 4 MG tablet Take 4 mg on Mon, Wed, Fri with supper 30 tablet 0 not started    Assessment: 60 yo F admitted 11/02/2016 on warfarin PTA for Afib and  bioprosthetic MVR. Pharmacy originally consulted for heparin drip but stopped 12/9 for bleeding around trach site. Restarted warfarin per pharmacy on 12/13 after G-tube placement.   INR decreased overnight again to 1.41, likely due to recent held doses. Hemoglobin over th past few days has been low/stable and platelet counts within normal limits. No notes of bleeding per RN.   Patient is also on interacting medications with rifampin (decreases INR) since 12/08 and amiodarone (increases INR). She is currently on tube feeds.   PTA dose (from previous discharge): 4mg  M/W/F and 2mg  T/T/S/S. Requirements have likely changed due to drug interactions.   Given trending down of INR again, will give small boost dose of warfarin tonight.   Goal of Therapy:  INR 2-3 Monitor platelets by anticoagulation protocol: Yes   Plan:  - Warfarin 3 mg x1 tonight - Monitor daily INR, CBC and  signs/symptoms of further bleeding   Demetrius Charity, PharmD Acute Care Pharmacy Resident  Pager: [phone] 11/20/2016

## 2016-11-20 NOTE — Progress Notes (Signed)
Inpatient Diabetes Program Recommendations  AACE/ADA: New Consensus Statement on Inpatient Glycemic Control (2015)  Target Ranges:  Prepandial:   less than 140 mg/dL      Peak postprandial:   less than 180 mg/dL (1-2 hours)      Critically ill patients:  140 - 180 mg/dL  Results for EVY, LUTTERMAN (MRN [id number]) as of 11/20/2016 10:23  Ref. Range 11/19/2016 08:06 11/19/2016 12:55 11/19/2016 17:01 11/19/2016 19:44 11/20/2016 00:12 11/20/2016 04:00 11/20/2016 08:30  Glucose-Capillary Latest Ref Range: 65 - 99 mg/dL 306 (H) 297 (H) 297 (H) 298 (H) 397 (H) 317 (H) 293 (H)    Review of Glycemic Control  Current orders for Inpatient glycemic control: Levemir 10 units QHS, Novolog 0-9 units Q4H  Inpatient Diabetes Program Recommendations: Insulin - Basal: Please consider increasing Levemir to 10 units BID. Insulin - Meal Coverage: Please consider ordering Novolog 4 units Q4H for tube feeding coverage (in addition to Novolog correction scale).  Thanks, Barnie Alderman, RN, MSN, CDE Diabetes Coordinator Inpatient Diabetes Program [phone] (Team Pager from 8am to 5pm)

## 2016-11-20 NOTE — Progress Notes (Signed)
[facility] Progress Note   Subjective:  Denies pain today. No new issues. On trach/vent.  Objective Vitals:   11/20/16 0414 11/20/16 0800 11/20/16 0819 11/20/16 0834  BP:  _0  Pulse:  99 100   Resp:  16 (!) 21   Temp:    97.8 F (36.6 C)  TempSrc:    Oral  SpO2:  100% 100%   Weight: 60 kg (132 lb 4.4 oz)     Height:       Physical Exam General: Awake, alert. AND. On trach/vent. Communicates by nodding (yes/no). Heart: RRR; no murmur Lungs: Clear anteriorly; on vent. Abdomen: Soft, non-tender. PEG. Extremities: No LE edema. Dialysis Access: LUE AVG + bruit.  Additional Objective Labs: Basic Metabolic Panel:  Recent Labs Lab 11/15/16 0500 11/16/16 0500 11/17/16 0945 11/18/16 0757  NA 136 134* 133* 131*  K 3.6 3.2* 4.4 3.7  CL 98* 95* 95* 95*  CO2 _1 GLUCOSE 68 147* 290* 318*  BUN _2 CREATININE 2.83* 4.10* 3.64* 4.36*  CALCIUM 8.0* 7.8* 8.2* 7.9*  PHOS 1.2* 3.4  --  2.9   Liver Function Tests:  Recent Labs Lab 11/16/16 0500 11/17/16 0945 11/18/16 0757  AST  --  18  --   ALT  --  <5*  --   ALKPHOS  --  73  --   BILITOT  --  1.0  --   PROT  --  5.6*  --   ALBUMIN 1.8* 1.9* 1.7*   CBC:  Recent Labs Lab 11/15/16 0500 11/16/16 0500 11/17/16 0945 11/18/16 0759 11/19/16 0415  WBC 15.6* 15.2* 17.7* 16.8* 17.0*  HGB 10.0* 9.7* 9.8* 8.8* 9.2*  HCT 31.4* 31.3* 31.2* 28.1* 30.0*  MCV 85.8 87.9 86.9 86.5 88.2  PLT 350 354 344 324 325  CBG:  Recent Labs Lab 11/19/16 1701 11/19/16 1944 11/20/16 0012 11/20/16 0400 11/20/16 0830  GLUCAP 297* 298* 397* 317* 293*   Medications: . feeding supplement (VITAL AF 1.2 CAL) 1,000 mL (11/20/16 0800)   . amiodarone  200 mg Per Tube Daily  . atorvastatin  20 mg Per Tube q1800  .  ceFAZolin (ANCEF) IV  1 g Intravenous Q24H  . chlorhexidine gluconate (MEDLINE KIT)  15 mL Mouth Rinse BID  . clonazePAM  0.5 mg Per Tube BID  . clopidogrel  75 mg Per Tube Daily   . darbepoetin (ARANESP) injection - DIALYSIS  100 mcg Intravenous Q Sat-HD  . docusate  100 mg Per Tube BID  . feeding supplement (PRO-STAT SUGAR FREE 64)  30 mL Oral BID  . guaiFENesin-codeine  5 mL Per Tube Q6H  . insulin aspart  0-9 Units Subcutaneous Q4H  . insulin detemir  10 Units Subcutaneous QHS  . mouth rinse  15 mL Mouth Rinse QID  . metoCLOPramide  10 mg Per Tube BID  . midodrine  10 mg Per Tube q morning - 10a  . multivitamin  1 tablet Oral QHS  . pantoprazole sodium  40 mg Per Tube Daily  . rifampin  300 mg Per Tube Q8H  . sennosides  5 mL Per Tube BID  . sodium chloride flush  3 mL Intravenous Q12H  . warfarin  3 mg Oral ONCE-1800  . Warfarin - Pharmacist Dosing Inpatient   Does not apply q1800    Dialysis Orders: [facility] on TTS schedule. 4 hours, 2K/2.5Ca bath, no EDW (only 1 OP dialysis), prior L IJ (removed), now using  LUE AVG  Assessment/Plan: 1. MSSA bacteremia/HD linesepsis: s/p HD cath removal, using L AVG. Refused TEE, empiric 6 wks IV abx per ID 2. Vocal cord paralysis, s/p repeat trach12/7: Remains on vent, weaning now  3. 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 No signs of recovery. Next HD 12/19. 4. Anemia: Hgb 9.2. Continue Aranesp 112mg weekly. 5. MBD: Ca/Phos/PTH ok. No meds for now. 6. BP/Volume: OP EDW 56 kg. No edema on exam, run even on HD. CXR 12/8 without pulm edema. HD today. Net UF 500 post wt 55.7. Had issues with cramping. BP stable. May need to allow EDW to drift up.  7. Nutrition: Albumin 1.7.Tube feeds ongoing, + prostat. Severe protein calorie malnutrition. 8. Hypophosphatemia: Phos 2.9. 9. Hypokalemia: 4K bath with HD. K+ 3.7 10. 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 Atrial fibrillation: Per primary. On amiodarone/warfarin. 12. DM: Per primary  KVeneta Penton PA-C 11/20/2016, 11:08 AM  [facility] Pager:  [phone]

## 2016-11-20 NOTE — Progress Notes (Signed)
Name: Deborah [name] MRN: [id number] DOB: 18-Jul-1956    ADMISSION DATE:  11/02/2016 CONSULTATION DATE:  11/02/2016  REFERRING MD :  Dr. Regenia Skeeter EDP  CHIEF COMPLAINT:  SOB  BRIEF PATIENT DESCRIPTION: 60 year old female ESRD 10/3/0/17 with recent prolonged hospitalization secondary to papillary muscle rupture and subsequent HCAP. She was intubated requiring trach. Decannulated in the end of October. She then went to [facility] and was discharged 11/29. No presenting to [facility] ED 11/30 with compalints of SOB x1-2 weeks. She was seen by ENT they found cords were not opening from either scarring, inflammation, or nerve issue. They were hoping she would improve in the coming days, however, she did not and was taken for tracheostomy 12/7.  SIGNIFICANT EVENTS  09/12 Admit to [facility] 09/15 To [facility] 09/20 MVR with bioprosthetic valve 09/28 Permcath for HD 09/29 Extubated 09/30 Reintubated 10/05 Trach by Hyman Bible 10/09 Off vent 10/25 decannulated 11/8 to CIR 11/29 DC CIR 11/30 admit for resp distress 127 - redo trach Tracheostomy 6.4 cuffed [name]) 12/7 >> 12/09 L IJ Tunneled HD catheter removed . LUE graft being used 12/10 -  Patient did have some mild emesis overnight. Denies any abdominal pain or nausea at this time. Denies any chest pain or difficulty breathing at this time.  SUBJECTIVE/OVERNIGHT/INTERVAL HX Tolerating ATC daytime, qhs vent. No c/o.   VITAL SIGNS: Temp:  [97.8 F (36.6 C)-98.5 F (36.9 C)] 97.8 F (36.6 C) (12/18 0834) Pulse Rate:  [93-100] 99 (12/18 0800) Resp:  [16-29] 16 (12/18 0800) BP: (95-105)/(58-68) 95/64 (12/18 0834) SpO2:  [100 %] 100 % (12/18 0800) FiO2 (%):  [28 %-40 %] 28 % (12/18 0815) Weight:  [60 kg (132 lb 4.4 oz)] 60 kg (132 lb 4.4 oz) (12/18 0414)  PHYSICAL EXAMINATION: General:  Elderly female, NAD on ATC  Neuro:  Alert, follows commands HEENT:  Normocephalic, Trach in place   Cardiovascular: RRR, no MRG, NI s1/s2  Pulmonary: Unlabored on trach collar,  Diminished as bases otherwise clear  Abdomen:  Soft. Nondistended. Normoactive bowel sounds. Integument:  Warm & dry. No rash on exposed skin.   PULMONARY No results for input(s): PHART, PCO2ART, PO2ART, HCO3, TCO2, O2SAT in the last 168 hours.  Invalid input(s): PCO2, PO2  CBC  Recent Labs Lab 11/17/16 0945 11/18/16 0759 11/19/16 0415  HGB 9.8* 8.8* 9.2*  HCT 31.2* 28.1* 30.0*  WBC 17.7* 16.8* 17.0*  PLT 344 324 325    COAGULATION  Recent Labs Lab 11/16/16 0500 11/17/16 0500 11/18/16 0500 11/19/16 0415 11/20/16 0414  INR 2.88 3.97 4.24* 1.95 1.41    CARDIAC  No results for input(s): TROPONINI in the last 168 hours. No results for input(s): PROBNP in the last 168 hours.   CHEMISTRY  Recent Labs Lab 11/14/16 0523 11/14/16 1845 11/15/16 0500 11/16/16 0500 11/17/16 0945 11/18/16 0757  NA 135  --  136 134* 133* 131*  K 3.1* 3.9 3.6 3.2* 4.4 3.7  CL 96*  --  98* 95* 95* 95*  CO2 25  --  27 27 27 26   GLUCOSE 115*  --  68 147* 290* 318*  BUN 32*  --  8 13 11 19   CREATININE 5.76*  --  2.83* 4.10* 3.64* 4.36*  CALCIUM 8.5*  --  8.0* 7.8* 8.2* 7.9*  MG 1.7 2.0 2.1  --   --   --   PHOS 2.1*  --  1.2* 3.4  --  2.9   Estimated Creatinine Clearance: 11 mL/min (by C-G formula based  on SCr of 4.36 mg/dL (H)).   LIVER  Recent Labs Lab 11/14/16 0523 11/15/16 0500 11/16/16 0500 11/17/16 0500 11/17/16 0945 11/18/16 0500 11/18/16 0757 11/19/16 0415 11/20/16 0414  AST  --   --   --   --  18  --   --   --   --   ALT  --   --   --   --  <5*  --   --   --   --   ALKPHOS  --   --   --   --  73  --   --   --   --   BILITOT  --   --   --   --  1.0  --   --   --   --   PROT  --   --   --   --  5.6*  --   --   --   --   ALBUMIN 1.9* 1.9* 1.8*  --  1.9*  --  1.7*  --   --   INR 1.87 2.16 2.88 3.97  --  4.24*  --  1.95 1.41     INFECTIOUS No results for input(s): LATICACIDVEN, PROCALCITON in the last 168 hours.   ENDOCRINE CBG (last 3)   Recent Labs   11/20/16 0012 11/20/16 0400 11/20/16 0830  GLUCAP 397* 317* 293*     MICROBIOLOGY: Urine Ctx 11/27:  Multiple species MRSA PCR 12/1:  Positive Blood Ctx x2 12/5:  2/2 MSSA Blood Ctx x1 12/7 >>NEG Catheter Tip Culture 12/9 >>NEG   ANTIBIOTICS: Cefepime 12/6 - 12/9 Vancomycin 12/6 - 12/9 Rifapmin 12/7 >> Cefazolin 12/9 >>  LINES/TUBES: PIV  ASSESSMENT / PLAN:   60 year old female with prolonged hospital stay, originally suffered a STEMI with papillary muscle rupture requiring surgical repair in September. The course of stay was complicated by acute on chronic renal failure, requiring CRRT, atrial flutter with underwent cardioversion 10/2. Underwent tracheostomy 10/5 and was eventually weaned and decannulated 10/25. She was transferred to [facility] for further rehabilitation where she was slow to progress but it was eventually discharged 11/29. On 11/30 presented back to ED for dyspnea, requiring tracheostomy on 12/7 by Dr. [name], and a PEG tube placement on 12/11.    Acute on chronic respiratory failure secondary to Vocal Cord Paralysis s/p ENT trach 12/7  HCAP H/O Tracheostomy (After prolonged intubation), second on 12/7 Plan: Continue ATC daytime as tol - can likely attempt ATC overnight as tolerated - no clear reason for her to need nocturnal pos pressure Continue working with speech therapy Keep oxygen saturation >92   Other medical issues per [facility]    Deborah Madrid, NP 11/20/2016  9:39 AM Pager: [phone] or [phone]   STAFF NOTE: IMerrie Roof, MD FACP have personally reviewed patient's available data, including medical history, events of note, physical examination and test results as part of my evaluation. I have discussed with resident/NP and other care providers such as pharmacist, RN and RRT. In addition, I personally evaluated patient and elicited key findings of: awake, off vent, good BS, to goal TC x 24 hours as goal, cuff down, she had  significant upper airway issues which may limit PMV in future, would have to be very conservative, d/w RT   Lavon Paganini. Titus Mould, MD, [place] Pgr: [facility] 11/20/2016 3:14 PM

## 2016-11-20 NOTE — Consult Note (Signed)
WOC re-consulted, however when I arrived to the unit, nurse reports she did not realize that my [place] partner had just seen the patient.  No new needs.  Dalesha Stanback Liane Comber MSN, RN, [employer], CNS

## 2016-11-21 LAB — GLUCOSE, CAPILLARY
GLUCOSE-CAPILLARY: 304 mg/dL — AB (ref 65–99)
GLUCOSE-CAPILLARY: 415 mg/dL — AB (ref 65–99)
Glucose-Capillary: 292 mg/dL — ABNORMAL HIGH (ref 65–99)
Glucose-Capillary: 228 mg/dL — ABNORMAL HIGH (ref 65–99)
Glucose-Capillary: 226 mg/dL — ABNORMAL HIGH (ref 65–99)
Glucose-Capillary: 340 mg/dL — ABNORMAL HIGH (ref 65–99)

## 2016-11-21 LAB — GLUCOSE, RANDOM: Glucose, Bld: 409 mg/dL — ABNORMAL HIGH (ref 65–99)

## 2016-11-21 LAB — RENAL FUNCTION PANEL
ALBUMIN: 1.7 g/dL — AB (ref 3.5–5.0)
Anion gap: 10 (ref 5–15)
BUN: 43 mg/dL — AB (ref 6–20)
CHLORIDE: 90 mmol/L — AB (ref 101–111)
CO2: 25 mmol/L (ref 22–32)
Calcium: 8.1 mg/dL — ABNORMAL LOW (ref 8.9–10.3)
Creatinine, Ser: 4.21 mg/dL — ABNORMAL HIGH (ref 0.44–1.00)
GFR calc Af Amer: 12 mL/min — ABNORMAL LOW (ref >60)
GFR calc non Af Amer: 11 mL/min — ABNORMAL LOW (ref >60)
GLUCOSE: 434 mg/dL — AB (ref 65–99)
PHOSPHORUS: 2.6 mg/dL (ref 2.5–4.6)
POTASSIUM: 4.2 mmol/L (ref 3.5–5.1)
Sodium: 125 mmol/L — ABNORMAL LOW (ref 135–145)

## 2016-11-21 LAB — CBC
HEMATOCRIT: 27.1 % — AB (ref 36.0–46.0)
Hemoglobin: 8.6 g/dL — ABNORMAL LOW (ref 12.0–15.0)
MCH: 27.7 pg (ref 26.0–34.0)
MCHC: 31.7 g/dL (ref 30.0–36.0)
MCV: 87.4 fL (ref 78.0–100.0)
Platelets: 329 10*3/uL (ref 150–400)
RBC: 3.1 MIL/uL — ABNORMAL LOW (ref 3.87–5.11)
RDW: 19.5 % — AB (ref 11.5–15.5)
WBC: 14.2 10*3/uL — ABNORMAL HIGH (ref 4.0–10.5)

## 2016-11-21 LAB — PROTIME-INR
INR: 1.43
PROTHROMBIN TIME: 17.6 s — AB (ref 11.4–15.2)

## 2016-11-21 MED ORDER — INSULIN DETEMIR 100 UNIT/ML ~~LOC~~ SOLN
15.0000 [IU] | Freq: Every day | SUBCUTANEOUS | Status: DC
  Filled 2016-11-21: qty 0.15

## 2016-11-21 MED ORDER — INSULIN DETEMIR 100 UNIT/ML ~~LOC~~ SOLN
10.0000 [IU] | Freq: Two times a day (BID) | SUBCUTANEOUS | Status: DC
  Administered 2016-11-21 – 2016-11-22 (×2): 10 [IU] via SUBCUTANEOUS
  Filled 2016-11-21 (×3): qty 0.1

## 2016-11-21 MED ORDER — INSULIN ASPART 100 UNIT/ML ~~LOC~~ SOLN
4.0000 [IU] | Freq: Four times a day (QID) | SUBCUTANEOUS | Status: DC
  Administered 2016-11-21 – 2016-11-22 (×4): 4 [IU] via SUBCUTANEOUS

## 2016-11-21 MED ORDER — INSULIN ASPART 100 UNIT/ML ~~LOC~~ SOLN
10.0000 [IU] | Freq: Once | SUBCUTANEOUS | Status: AC
  Administered 2016-11-21: 10 [IU] via SUBCUTANEOUS

## 2016-11-21 MED ORDER — WARFARIN SODIUM 3 MG PO TABS
3.0000 mg | ORAL_TABLET | Freq: Once | ORAL | Status: AC
  Administered 2016-11-21: 3 mg via ORAL
  Filled 2016-11-21: qty 1

## 2016-11-21 NOTE — Plan of Care (Signed)
Problem: Education: Goal: Knowledge of [facility] Education information/materials will improve Outcome: Progressing Discussed with patient and daughters importance of weaning her off the mechanical ventilation with some teach back displayed.  The daughter [name] wants by primary doctor and updated when to mom's return to home (please call at [phone]).

## 2016-11-21 NOTE — Plan of Care (Signed)
Problem: Education: Goal: Knowledge of  [facility] Education information/materials will improve Outcome: Progressing Discussed with family and patient about dialysis and amount pulled off today. Talked about plan of care with some teach back displayed.

## 2016-11-21 NOTE — Progress Notes (Addendum)
PROGRESS NOTE    Deborah [name]  EXB:[id number] DOB: Oct 08, 1956 DOA: 11/02/2016 PCP: Rica Mast, MD   Brief Narrative: 59 y.o. female Hxprolonged intubation requiring tracheostomy placement and subsequent decannulation,STEMI with Papillary Muscle Rupture requiring surgical repair in September, Atrial Flutter S/P cardioversion on Coumadin, HTN, DM2, HLD, and ESRD on HD (T/TH/Sat) who presented with labored breathing during her hemodialysissession.  Assessment & Plan:   Significant Events: 09/12 Admit to [facility] 09/15 Tx to [facility] 09/20 MVR with bioprosthetic valve 09/28 Permcath for HD 09/29 Extubated 09/30 Reintubated 10/05 Trach by Hyman Bible 10/09 Off vent 10/25 Decannulated 11/8 to CIR 11/29 DC from CIR 11/30 admit for resp distress 127 redo trach Tracheostomy 6.4 cuffed([place] - ENT)  12/09 L IJ Tunneled HD catheter removed  12/11 PEG tube in IR - L IJ double lumen cath in IR   # Vocal cord paralysis - repeat tracheostomy by ENT 12/7 -Tracheostomy care and vent management per ENT/PCCM. -had tracheostomy on 12/7 by Dr. [name], and a PEG tube placement on 12/11. -PT eval recommended SNF however family and patient resistance to SNF. Difficult discharge planning because of requiring ventilator support and hemodialysis treatment. Discussed with the care management team regarding discharge planning. Also discussed with the patient and her daughter at bedside at length.  # HCAP -Improved now. Currently on ventilator support.  # MSSA Bacteremia  -Presumed HD cath related - HD cath out per ID - refused TEE therefore ID suggests 6 weeks of abx w/ repeat blood cx 2 weeks after course completed . -Currently on IV cefazolin and rifampin.  #ESRD on HD (T/TH/Sat) -HD continues via AVG - Nephrology following  -Monitor electrolytes, blood pressure.  #Paroxysmal Atrial Fibrillation -Continue amiodarone for rate control. On Coumadin for systemic anticoagulation. Monitor INR. INR  is subtherapeutic today. Pharmacy is dosing Coumadin.  #Bioprosthetic Mitral valve ID requested TEE - pt refused  -stable  # Acute on Chronic Hypotension -Continue midodrine - BP stable  #Inferior STEMI S/P DES x 05 August 2016 -continue Plavix, Lipitor  # Uncontrolled Type 2 diabetes with hyperglycemia: Blood sugar level is elevated. I increased the dose of Levemir to 10 units bid and added novolog 4 units ever 6 hours. Continue sliding scale. Patient is on continuous tube feeding.  #Anxiety -Continue supportive care.  # Normocytic anemia due to CKD: on ESA and iron treatment during HD as per nephrologist. Monitor CBC. No sign of bleeding.   Principal Problem:   Staphylococcus aureus bacteremia Active Problems:   Mitral regurgitation   S/P MVR (mitral valve replacement)   HCAP (healthcare-associated pneumonia)   Diabetes mellitus type 2 in nonobese ([name])   ESRD on dialysis ([name])   Anemia of chronic disease   Atrial flutter ([name])   Respiratory distress   Vocal cord paralysis   Bacteremia   End-stage renal disease on hemodialysis ([name])   Paroxysmal atrial fibrillation ([place])   Heart attack   Uncontrolled type 2 diabetes mellitus with complication, without long-term current use of insulin ([place])   Advance care planning   Vocal cord dysfunction   Palliative care by specialist   Goals of care, counseling/discussion   Palliative care encounter  DVT prophylaxis: Systemic anticoagulation Code Status: Full code Family Communication: Discussed with the patient's daughter at length at bedside. Disposition Plan: Likely discharge to SNF versus home with home care. They complicated discharge planning because of family declining SNF or rehabilitation. Patient is on ventilator and required hemodialysis treatment. I discussed this with the case manager.  Consultants:  Nephrologist, palliative care, infectious disease  Antimicrobials: On cefazolin for total 6 weeks duration.  On rifampin as per ID.  Subjective: Patient was seen and examined at bedside. Patient is on track collar. She is able to communicate by writing and by signs. Patient's daughter at bedside. Denied chest pain, shortness of breath, nausea or vomiting.   Objective: Vitals:   11/21/16 1206 11/21/16 1240 11/21/16 1248 11/21/16 1513  BP: 112/75  125/72 100/72  Pulse: 95 96 (!) 103 (!) 109  Resp: 20 20 (!) 21 (!) 21  Temp: 97.9 F (36.6 C)  98.6 F (37 C)   TempSrc: Oral  Oral   SpO2: 100% 100% 100% 99%  Weight: 60.5 kg (133 lb 6.1 oz)     Height:        Intake/Output Summary (Last 24 hours) at 11/21/16 1553 Last data filed at 11/21/16 1350  Gross per 24 hour  Intake           1150.5 ml  Output             1001 ml  Net            149.5 ml   Filed Weights   11/21/16 0800 11/21/16 0821 11/21/16 1206  Weight: 61.6 kg (135 lb 12.9 oz) 61.6 kg (135 lb 12.9 oz) 60.5 kg (133 lb 6.1 oz)    Examination:  General exam: Ill-looking female lying on bed comfortable, Neck: Track collar site clean, no discharge or bleeding noticed Respiratory system: Clear to auscultation. Respiratory effort normal. No wheezing or crackle Cardiovascular system: S1 & S2 heard, RRR.  No pedal edema. Gastrointestinal system: Abdomen is nondistended, soft and nontender. Normal bowel sounds heard. Central nervous system: Alert awake and following commands. Extremities: Symmetric 5 x 5 power. Skin: No rashes, lesions or ulcers Psychiatry: Difficulty to assess because of track collar and not able to talk.     Data Reviewed: I have personally reviewed following labs and imaging studies  CBC:  Recent Labs Lab 11/16/16 0500 11/17/16 0945 11/18/16 0759 11/19/16 0415 11/21/16 0700  WBC 15.2* 17.7* 16.8* 17.0* 14.2*  HGB 9.7* 9.8* 8.8* 9.2* 8.6*  HCT 31.3* 31.2* 28.1* 30.0* 27.1*  MCV 87.9 86.9 86.5 88.2 87.4  PLT 354 344 324 325 264   Basic Metabolic Panel:  Recent Labs Lab 11/14/16 1845   11/15/16 0500 11/16/16 0500 11/17/16 0945 11/18/16 0757 11/21/16 0534 11/21/16 0700  NA  --   --  136 134* 133* 131*  --  125*  K 3.9  --  3.6 3.2* 4.4 3.7  --  4.2  CL  --   --  98* 95* 95* 95*  --  90*  CO2  --   --  _0 --  25  GLUCOSE  --   < > 68 147* 290* 318* 409* 434*  BUN  --   --  _1 --  43*  CREATININE  --   --  2.83* 4.10* 3.64* 4.36*  --  4.21*  CALCIUM  --   --  8.0* 7.8* 8.2* 7.9*  --  8.1*  MG 2.0  --  2.1  --   --   --   --   --   PHOS  --   --  1.2* 3.4  --  2.9  --  2.6  < > = values in this interval not displayed. GFR: Estimated Creatinine Clearance: 12.3 mL/min (by C-G formula based on  SCr of 4.21 mg/dL (H)). Liver Function Tests:  Recent Labs Lab 11/15/16 0500 11/16/16 0500 11/17/16 0945 11/18/16 0757 11/21/16 0700  AST  --   --  18  --   --   ALT  --   --  <5*  --   --   ALKPHOS  --   --  73  --   --   BILITOT  --   --  1.0  --   --   PROT  --   --  5.6*  --   --   ALBUMIN 1.9* 1.8* 1.9* 1.7* 1.7*   No results for input(s): LIPASE, AMYLASE in the last 168 hours. No results for input(s): AMMONIA in the last 168 hours. Coagulation Profile:  Recent Labs Lab 11/17/16 0500 11/18/16 0500 11/19/16 0415 11/20/16 0414 11/21/16 0544  INR 3.97 4.24* 1.95 1.41 1.43   Cardiac Enzymes: No results for input(s): CKTOTAL, CKMB, CKMBINDEX, TROPONINI in the last 168 hours. BNP (last 3 results) No results for input(s): PROBNP in the last 8760 hours. HbA1C: No results for input(s): HGBA1C in the last 72 hours. CBG:  Recent Labs Lab 11/20/16 1958 11/20/16 2339 11/21/16 0532 11/21/16 1020 11/21/16 1246  GLUCAP 267* 227* 415* 292* 228*   Lipid Profile: No results for input(s): CHOL, HDL, LDLCALC, TRIG, CHOLHDL, LDLDIRECT in the last 72 hours. Thyroid Function Tests: No results for input(s): TSH, T4TOTAL, FREET4, T3FREE, THYROIDAB in the last 72 hours. Anemia Panel: No results for input(s): VITAMINB12, FOLATE, FERRITIN, TIBC,  IRON, RETICCTPCT in the last 72 hours. Sepsis Labs: No results for input(s): PROCALCITON, LATICACIDVEN in the last 168 hours.  No results found for this or any previous visit (from the past 240 hour(s)).       Radiology Studies: No results found.      Scheduled Meds: . amiodarone  200 mg Per Tube Daily  . atorvastatin  20 mg Per Tube q1800  .  ceFAZolin (ANCEF) IV  1 g Intravenous Q24H  . chlorhexidine gluconate (MEDLINE KIT)  15 mL Mouth Rinse BID  . clonazePAM  0.5 mg Per Tube BID  . clopidogrel  75 mg Per Tube Daily  . darbepoetin (ARANESP) injection - DIALYSIS  100 mcg Intravenous Q Sat-HD  . docusate  100 mg Per Tube BID  . feeding supplement (PRO-STAT SUGAR FREE 64)  30 mL Oral BID  . guaiFENesin-codeine  5 mL Per Tube Q6H  . insulin aspart  0-9 Units Subcutaneous Q4H  . insulin detemir  10 Units Subcutaneous QHS  . mouth rinse  15 mL Mouth Rinse QID  . metoCLOPramide  10 mg Per Tube BID  . midodrine  10 mg Per Tube q morning - 10a  . multivitamin  1 tablet Oral QHS  . pantoprazole sodium  40 mg Per Tube Daily  . rifampin  300 mg Per Tube Q8H  . sennosides  5 mL Per Tube BID  . sodium chloride flush  3 mL Intravenous Q12H  . warfarin  3 mg Oral ONCE-1800  . Warfarin - Pharmacist Dosing Inpatient   Does not apply q1800   Continuous Infusions: . feeding supplement (VITAL AF 1.2 CAL) 1,000 mL (11/20/16 1730)     LOS: 19 days    Marielouise Amey Tanna Furry, MD [facility] Pager [phone]  If 7PM-7AM, please contact night-coverage [email or web address] Password [email or web address] 11/21/2016, 3:53 PM

## 2016-11-21 NOTE — Progress Notes (Signed)
Patient will need vent/dialysis or trach/dialysis center.  CSW faxed to the two closest dialysis capable facilities    [place], [place], [place] [place] could not accept patient for another 30 days because they are new dialysis center and require approval to open another dialysis bed- also state that they can only take patient with vent/dialysis and could not accommodate patient if she weans to just trach collar   [facility], [place], [place]- [phone]-  Sent updates 12/19 on dialysis- they are reviewing but could take vent or trach dialysis- would need to speak with a family member or patient regarding [place]  CSW will continue to follow  Jorge Ny, [place] Social Worker [phone]

## 2016-11-21 NOTE — Progress Notes (Signed)
Speech Language Pathology Treatment: Deborah [name] Speaking valve  Patient Details Name: Deborah [name] MRN: [id number] DOB: 1956/09/23 Today's Date: 11/21/2016 Time: 1200-1220 SLP Time Calculation (min) (ACUTE ONLY): 20 min  Assessment / Plan / Recommendation Clinical Impression  Pt was seen for PMSV trial following in-room dialysis. Pt alert and willing to try placement. Respiratory present and switched pt to trach collar. SLP placed valve, which pt tolerated until she started to speak. At that point, pt began coughing, unable to speak or stop coughing. She coughed the valve off at one point.  This was tried 2 more times with the same result. Pt had just finished dialysis, so she may have been fatigued.  ST will continue to follow for PMSV trials and pt/family education.   HPI HPI: Pt is a 60 y/o female recently discharged 11/29 from [facility] and presented to [facility] on 11/30 secondary to dyspnea. PMH including but not limited to A-fib/flutter, DM, ESRD, and CAD s/p STEMI in September (08/15/16). Pt was intubated on 9/30 with trach placement on 10/5. She progressed to weaning off of vent on 10/09 and decannulated on 10/25. She has since had to have a second trach placement this admission on 11/09/16.      SLP Plan  Continue with current plan of care     Recommendations         Patient may use Passy-Muir Speech Valve: with SLP only PMSV Supervision: Full         Follow up Recommendations: Inpatient Rehab Plan: Continue with current plan of care       Deborah [name] B. [place], [facility], CCC-SLP [phone]  Deborah [name] 11/21/2016, 1:16 PM

## 2016-11-21 NOTE — Clinical Social Work Note (Signed)
Clinical Social Work Assessment  Patient Details  Name: Deborah [name] MRN: [id number] Date of Birth: August 21, 1956  Date of referral:  11/21/16               Reason for consult:  Facility Placement                Permission sought to share information with:  Facility Art therapist granted to share information::  Yes, Verbal Permission Granted  Name::        Agency::  SNFs  Relationship::     Contact Information:     Housing/Transportation Living arrangements for the past 2 months:  Single Family Home Source of Information:  Patient Patient Interpreter Needed:  None Criminal Activity/Legal Involvement Pertinent to Current Situation/Hospitalization:  No - Comment as needed Significant Relationships:  Adult Children Lives with:    Do you feel safe going back to the place where you live?    Need for family participation in patient care:  No (Coment)  Care giving concerns:  Patient needing vent/dialysis and unable to return home with trach and continue outpatient dialysis   Social Worker assessment / plan:  CSW and RNCM spoke to patient and patient dtr concerning placement options for patient with her current medical needs.  Explained that because patient would need to get dialysis in a SNF there are only a few options that are relatively close and they are out of state.    Patient expressed understanding by nodding.  Per RNCM pt dtr was frustrated by lack of options and wants to look into closer possibilities.  Employment status:    Insurance information:  Self Pay (Medicaid Pending) PT Recommendations:  [employer] / Referral to community resources:  [place]  Patient/Family's Response to care:  Though family are not realistic about patients choices the patient seemed calm and reasonable about possibility of out of state placement.  Patient/Family's Understanding of and Emotional Response to Diagnosis, Current Treatment,  and Prognosis:  Patient has previously expressed understanding of the long road to recovery ahead of her but maintains that she wants to continue trying to get better and go home.  Emotional Assessment Appearance:  Appears stated age Attitude/Demeanor/Rapport:    Affect (typically observed):  Accepting, Calm, Appropriate Orientation:  Oriented to Situation, Oriented to  Time, Oriented to Place, Oriented to Self Alcohol / Substance use:  Not Applicable Psych involvement (Current and /or in the community):  No (Comment)  Discharge Needs  Concerns to be addressed:  Care Coordination Readmission within the last 30 days:  Yes Current discharge risk:  Physical Impairment, Chronically ill Barriers to Discharge:  Continued Medical Work up, [employer] not available   Jorge Ny, LCSW 11/21/2016, 4:11 PM

## 2016-11-21 NOTE — Progress Notes (Signed)
Pharmacy Antibiotic Note Deborah [name] is a 60 y.o. female admitted on 11/02/2016 with HCAP and MSSA bacteremia in setting of recent valve replacement; Patient has CKD on HD   Plan: Continue cefazolin 1 gram IV every 24 hours (d/t erratic HD schedule) Rifampin per ID 6 week duration per ID, pt refused TEE  Height: 5\' 2"  (157.5 cm) Weight: 138 lb 14.2 oz (63 kg) IBW/kg (Calculated) : 50.1  Temp (24hrs), Avg:98.2 F (36.8 C), Min:97.8 F (36.6 C), Max:98.6 F (37 C)   Recent Labs Lab 11/15/16 0500 11/16/16 0500 11/17/16 0945 11/18/16 0757 11/18/16 0759 11/19/16 0415  WBC 15.6* 15.2* 17.7*  --  16.8* 17.0*  CREATININE 2.83* 4.10* 3.64* 4.36*  --   --     Estimated Creatinine Clearance: 12.1 mL/min (by C-G formula based on SCr of 4.36 mg/dL (H)).    No Known Allergies  Pharmacy will sign off, please let us know for any further abx needs  Levester Fresh, PharmD, BCPS, BCCCP Clinical Pharmacist Clinical phone for 11/21/2016 from 7a-3:30p: [phone] If after 3:30p, please call main pharmacy at: [phone] 11/21/2016 7:54 AM

## 2016-11-21 NOTE — Progress Notes (Signed)
Inpatient Diabetes Program Recommendations  AACE/ADA: New Consensus Statement on Inpatient Glycemic Control (2015)  Target Ranges:  Prepandial:   less than 140 mg/dL      Peak postprandial:   less than 180 mg/dL (1-2 hours)      Critically ill patients:  140 - 180 mg/dL   Results for NIKITA, SURMAN (MRN [id number]) as of 11/21/2016 08:45  Ref. Range 11/20/2016 08:30 11/20/2016 12:52 11/20/2016 17:03 11/20/2016 19:58 11/20/2016 23:39 11/21/2016 05:32  Glucose-Capillary Latest Ref Range: 65 - 99 mg/dL 293 (H) 292 (H) 373 (H) 267 (H) 227 (H) 415 (H)   Review of Glycemic Control  Current orders for Inpatient glycemic control: Levemir 10 units QHS, Novolog 0-9 units Q4H  Inpatient Diabetes Program Recommendations: Insulin - Basal: Please consider increasing Levemir to 10 units BID. Insulin - Meal Coverage: Please consider ordering Novolog 4 units Q4H for tube feeding coverage (in addition to Novolog correction scale).  Note: Over the past 24 hours, glucose has ranged from 227-415 mg/dl and patient has received a total of Novolog 35 units for correction.   Thanks, Barnie Alderman, RN, MSN, CDE Diabetes Coordinator Inpatient Diabetes Program [phone] (Team Pager from 8am to 5pm)

## 2016-11-21 NOTE — Procedures (Signed)
I have seen and examined this patient and agree with the plan of care. Patient seen during dialysis treatment no complaints  BP 130/90  Goal 3 L  Ca 8.1  Phos 2.6 Alb 1.7 Hb 8.6  No edema     Deborah [name] W 11/21/2016, 9:14 AM

## 2016-11-21 NOTE — Progress Notes (Signed)
ANTICOAGULATION CONSULT NOTE - Follow Up Consult  Pharmacy Consult for warfarin  Indication: atrial fibrillation and bioprosthetic MVR  No Known Allergies  Patient Measurements: Height: 5\' 2"  (157.5 cm) Weight: 138 lb 14.2 oz (63 kg) IBW/kg (Calculated) : 50.1  Vital Signs: Temp: 98.2 F (36.8 C) (12/18 2300) Temp Source: Oral (12/18 2300) BP: 116/76 (12/19 0500) Pulse Rate: 103 (12/19 0600)  Labs:  Recent Labs  11/18/16 0757 11/18/16 0759 11/19/16 0415 11/20/16 0414 11/21/16 0544  HGB  --  8.8* 9.2*  --   --   HCT  --  28.1* 30.0*  --   --   PLT  --  324 325  --   --   LABPROT  --   --  22.6* 17.4* 17.6*  INR  --   --  1.95 1.41 1.43  CREATININE 4.36*  --   --   --   --     Estimated Creatinine Clearance: 12.1 mL/min (by C-G formula based on SCr of 4.36 mg/dL (H)).   Assessment: 60 yo F presents on 11/30 with SOB. 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 Admit INR 1.95 Warfarin re-started 12/13. 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 Last HD 12/16 (3.5 hrs @ BFR 400), Na 131, other lytes ok. - ferric gluconate with HD, midodrine daily, rena-vit  Heme/Onc: H&H 9.2/30, Plt 325 (12/17)  Goal of Therapy:  INR 2-3 Monitor platelets by anticoagulation protocol: Yes   Plan:  - Warfarin 3 mg x1 tonight - Monitor daily INR, CBC and signs/symptoms of further bleeding, watch DDI - CBC in am  Levester Fresh, PharmD, BCPS, BCCCP Clinical Pharmacist Clinical phone for 11/21/2016 from 7a-3:30p: [phone] If after 3:30p, please call main pharmacy at: [phone] 11/21/2016 7:53 AM

## 2016-11-21 NOTE — Care Management Note (Signed)
Case Management Note  Patient Details  Name: Deborah [name] MRN: [id number] Date of Birth: 12/20/1955  Subjective/Objective:   CM met with pt and daughter, [name] ([phone]), to discuss options.  They have previously been informed that the facilities that provide HD for pts with trachs are out of state - they continue to resist that option.  CM provided information about home on vent with no HD or home on TC.  One OP HD facility in [place] will take trach pts if trach is capped - currently it is unclear whether pt's trach can be capped.  Daughter states she is going to call facilities in [place] and surrounding counties as she believes there are other facilities that will take trach pts who are on HD.  Per Financial Counselor, medicaid approval is contingent on disability and disability determination is due 11/26/16. .                      Expected Discharge Plan:  Skilled Nursing Facility  In-House Referral:  Clinical Social Work  Discharge planning Services  CM Consult  Status of Service:  In process, will continue to follow  ,  T, RN 11/21/2016, 12:02 PM  

## 2016-11-22 LAB — GLUCOSE, CAPILLARY
GLUCOSE-CAPILLARY: 219 mg/dL — AB (ref 65–99)
GLUCOSE-CAPILLARY: 391 mg/dL — AB (ref 65–99)
Glucose-Capillary: 318 mg/dL — ABNORMAL HIGH (ref 65–99)
Glucose-Capillary: 309 mg/dL — ABNORMAL HIGH (ref 65–99)
Glucose-Capillary: 353 mg/dL — ABNORMAL HIGH (ref 65–99)

## 2016-11-22 LAB — PROTIME-INR
INR: 1.46
PROTHROMBIN TIME: 17.8 s — AB (ref 11.4–15.2)

## 2016-11-22 LAB — CBC
HCT: 29.1 % — ABNORMAL LOW (ref 36.0–46.0)
Hemoglobin: 8.9 g/dL — ABNORMAL LOW (ref 12.0–15.0)
MCH: 27.3 pg (ref 26.0–34.0)
MCHC: 30.6 g/dL (ref 30.0–36.0)
MCV: 89.3 fL (ref 78.0–100.0)
PLATELETS: 359 10*3/uL (ref 150–400)
RBC: 3.26 MIL/uL — ABNORMAL LOW (ref 3.87–5.11)
RDW: 20 % — AB (ref 11.5–15.5)
WBC: 13.8 10*3/uL — ABNORMAL HIGH (ref 4.0–10.5)

## 2016-11-22 MED ORDER — INSULIN DETEMIR 100 UNIT/ML ~~LOC~~ SOLN
20.0000 [IU] | Freq: Two times a day (BID) | SUBCUTANEOUS | Status: DC
  Administered 2016-11-22 – 2016-11-26 (×8): 20 [IU] via SUBCUTANEOUS
  Filled 2016-11-22 (×8): qty 0.2

## 2016-11-22 MED ORDER — WARFARIN SODIUM 5 MG PO TABS
7.5000 mg | ORAL_TABLET | Freq: Once | ORAL | Status: AC
  Administered 2016-11-22: 7.5 mg via ORAL
  Filled 2016-11-22: qty 1.5
  Filled 2016-11-22: qty 2

## 2016-11-22 MED ORDER — INSULIN ASPART 100 UNIT/ML ~~LOC~~ SOLN
6.0000 [IU] | Freq: Four times a day (QID) | SUBCUTANEOUS | Status: DC
  Administered 2016-11-22 – 2016-11-26 (×14): 6 [IU] via SUBCUTANEOUS

## 2016-11-22 NOTE — Progress Notes (Signed)
Pts daughters, Deborah [name] and Deborah [name], were present in patients room this evening. The plan of care was discussed with Deborah [name], who is opposed to [facility] in [place], [place], and wants to explore other options for patient. The daughters contacted Dr. Jacqulyn Liner, Cardiologist, who came to see patient. Dr. Cline Crock informed the family that he would request/order consult with ENT, Dr Vassie Loll to discuss options to repair vocal cords; that the patient does not need the vent due to her current saturation status of 100% on trach collar at 5L of O2.   The siblings also requested that Gila Lauf be made the HPOA. The patient also requested that either daughter, Deborah [name] or Deborah [name] be present at all times when providers are with patient discussing plan of care at discharge from [facility].

## 2016-11-22 NOTE — Progress Notes (Signed)
CT surgery Rounds  Patient breathing comfortably on trach collar with saturation 100% Lungs are clear Prosthetic mitral valve functioning without murmur, sternal incision well-healed, sinus rhythm Does not appear the patient is going to be ventilator dependent but will need tracheostomy We'll contact her ENT consultant Dr. [name] regarding possible cord reinforcement or injection Will follow Deborah [name] trigt M.D.

## 2016-11-22 NOTE — Progress Notes (Signed)
[facility] ROUNDING NOTE   Subjective:   Interval History:  No complaints  Trach   Objective:  Vital signs in last 24 hours:  Temp:  [97.9 F (36.6 C)-99 F (37.2 C)] 98.5 F (36.9 C) (12/20 0900) Pulse Rate:  [86-110] 98 (12/20 0900) Resp:  [18-22] 20 (12/20 0900) BP: (94-125)/(60-77) 98/64 (12/20 0900) SpO2:  [99 %-100 %] 100 % (12/20 0900) FiO2 (%):  [28 %-30 %] 30 % (12/20 0821) Weight:  [60.5 kg (133 lb 6.1 oz)-61.1 kg (134 lb 11.2 oz)] 61.1 kg (134 lb 11.2 oz) (12/20 0315)  Weight change: -1.4 kg (-3 lb 1.4 oz) Filed Weights   11/21/16 0821 11/21/16 1206 11/22/16 0315  Weight: 61.6 kg (135 lb 12.9 oz) 60.5 kg (133 lb 6.1 oz) 61.1 kg (134 lb 11.2 oz)    Intake/Output: I/O last 3 completed shifts: In: 2253 [I.V.:293; Other:60; NG/GT:1800; IV Piggyback:100] Out: 1001 [Other:1000; Stool:1]   Intake/Output this shift:  Total I/O In: 125 [NG/GT:125] Out: -   CVS- RRR RS- CTA trach site clean  ABD- BS present soft non-distended EXT- no edema   Basic Metabolic Panel:  Recent Labs Lab 11/16/16 0500 11/17/16 0945 11/18/16 0757 11/21/16 0534 11/21/16 0700  NA 134* 133* 131*  --  125*  K 3.2* 4.4 3.7  --  4.2  CL 95* 95* 95*  --  90*  CO2 27 27 26   --  25  GLUCOSE 147* 290* 318* 409* 434*  BUN 13 11 19   --  43*  CREATININE 4.10* 3.64* 4.36*  --  4.21*  CALCIUM 7.8* 8.2* 7.9*  --  8.1*  PHOS 3.4  --  2.9  --  2.6    Liver Function Tests:  Recent Labs Lab 11/16/16 0500 11/17/16 0945 11/18/16 0757 11/21/16 0700  AST  --  18  --   --   ALT  --  <5*  --   --   ALKPHOS  --  73  --   --   BILITOT  --  1.0  --   --   PROT  --  5.6*  --   --   ALBUMIN 1.8* 1.9* 1.7* 1.7*   No results for input(s): LIPASE, AMYLASE in the last 168 hours. No results for input(s): AMMONIA in the last 168 hours.  CBC:  Recent Labs Lab 11/17/16 0945 11/18/16 0759 11/19/16 0415 11/21/16 0700 11/22/16 0220  WBC 17.7* 16.8* 17.0* 14.2* 13.8*  HGB 9.8*  8.8* 9.2* 8.6* 8.9*  HCT 31.2* 28.1* 30.0* 27.1* 29.1*  MCV 86.9 86.5 88.2 87.4 89.3  PLT 344 324 325 329 359    Cardiac Enzymes: No results for input(s): CKTOTAL, CKMB, CKMBINDEX, TROPONINI in the last 168 hours.  BNP: Invalid input(s): POCBNP  CBG:  Recent Labs Lab 11/21/16 1603 11/21/16 2052 11/21/16 2332 11/22/16 0414 11/22/16 0807  GLUCAP 226* 340* 304* 219* 318*    Microbiology: Results for orders placed or performed during the hospital encounter of 11/02/16  MRSA PCR Screening     Status: Abnormal   Collection Time: 11/03/16  5:42 AM  Result Value Ref Range Status   MRSA by PCR POSITIVE (A) NEGATIVE Final    Comment:        The GeneXpert MRSA Assay (FDA approved for NASAL specimens only), is one component of a comprehensive MRSA colonization surveillance program. It is not intended to diagnose MRSA infection nor to guide or monitor treatment for MRSA infections. RESULT CALLED TO, READ BACK BY AND  VERIFIED WITH: M.SCHMIT RN AT [phone] 11/03/16 BY A.DAVIS   Culture, blood (Routine X 2) w Reflex to ID Panel     Status: Abnormal   Collection Time: 11/07/16  7:02 PM  Result Value Ref Range Status   Specimen Description BLOOD RIGHT ANTECUBITAL  Final   Special Requests IN PEDIATRIC BOTTLE 2CC  Final   Culture  Setup Time   Final    AEROBIC BOTTLE ONLY GRAM POSITIVE COCCI Organism ID to follow CRITICAL RESULT CALLED TO, READ BACK BY AND VERIFIED WITH: Narda Bonds, PHARMD @2343  11/08/16 MKELLY,MLT    Culture STAPHYLOCOCCUS AUREUS (A)  Final   Report Status 11/11/2016 FINAL  Final   Organism ID, Bacteria STAPHYLOCOCCUS AUREUS  Final      Susceptibility   Staphylococcus aureus - MIC*    CIPROFLOXACIN 2 INTERMEDIATE Intermediate     ERYTHROMYCIN >=8 RESISTANT Resistant     GENTAMICIN <=0.5 SENSITIVE Sensitive     OXACILLIN 0.5 SENSITIVE Sensitive     TETRACYCLINE <=1 SENSITIVE Sensitive     VANCOMYCIN <=0.5 SENSITIVE Sensitive     TRIMETH/SULFA <=10  SENSITIVE Sensitive     CLINDAMYCIN <=0.25 RESISTANT Resistant     RIFAMPIN <=0.5 SENSITIVE Sensitive     Inducible Clindamycin POSITIVE Resistant     * STAPHYLOCOCCUS AUREUS  Blood Culture ID Panel (Reflexed)     Status: Abnormal   Collection Time: 11/07/16  7:02 PM  Result Value Ref Range Status   Enterococcus species NOT DETECTED NOT DETECTED Final   Listeria monocytogenes NOT DETECTED NOT DETECTED Final   Staphylococcus species DETECTED (A) NOT DETECTED Final    Comment: CRITICAL RESULT CALLED TO, READ BACK BY AND VERIFIED WITH: JAMES LEDFORD, PHARMD @2343  11/08/16 MKELLY,MLT    Staphylococcus aureus DETECTED (A) NOT DETECTED Final    Comment: CRITICAL RESULT CALLED TO, READ BACK BY AND VERIFIED WITH: JAMES LEDFORD, PHARMD @2343  11/08/16 MKELLY,MLT    Methicillin resistance NOT DETECTED NOT DETECTED Final   Streptococcus species NOT DETECTED NOT DETECTED Final   Streptococcus agalactiae NOT DETECTED NOT DETECTED Final   Streptococcus pneumoniae NOT DETECTED NOT DETECTED Final   Streptococcus pyogenes NOT DETECTED NOT DETECTED Final   Acinetobacter baumannii NOT DETECTED NOT DETECTED Final   Enterobacteriaceae species NOT DETECTED NOT DETECTED Final   Enterobacter cloacae complex NOT DETECTED NOT DETECTED Final   Escherichia coli NOT DETECTED NOT DETECTED Final   Klebsiella oxytoca NOT DETECTED NOT DETECTED Final   Klebsiella pneumoniae NOT DETECTED NOT DETECTED Final   Proteus species NOT DETECTED NOT DETECTED Final   Serratia marcescens NOT DETECTED NOT DETECTED Final   Haemophilus influenzae NOT DETECTED NOT DETECTED Final   Neisseria meningitidis NOT DETECTED NOT DETECTED Final   Pseudomonas aeruginosa NOT DETECTED NOT DETECTED Final   Candida albicans NOT DETECTED NOT DETECTED Final   Candida glabrata NOT DETECTED NOT DETECTED Final   Candida krusei NOT DETECTED NOT DETECTED Final   Candida parapsilosis NOT DETECTED NOT DETECTED Final   Candida tropicalis NOT DETECTED  NOT DETECTED Final  Culture, blood (Routine X 2) w Reflex to ID Panel     Status: Abnormal   Collection Time: 11/07/16  7:08 PM  Result Value Ref Range Status   Specimen Description BLOOD RIGHT HAND  Final   Special Requests BOTTLES DRAWN AEROBIC ONLY 5CC  Final   Culture  Setup Time   Final    GRAM POSITIVE COCCI IN CLUSTERS AEROBIC BOTTLE ONLY CRITICAL VALUE NOTED.  VALUE IS CONSISTENT WITH PREVIOUSLY REPORTED AND  CALLED VALUE.    Culture (A)  Final    STAPHYLOCOCCUS AUREUS SUSCEPTIBILITIES PERFORMED ON PREVIOUS CULTURE WITHIN THE LAST 5 DAYS.    Report Status 11/11/2016 FINAL  Final  Culture, blood (Routine X 2) w Reflex to ID Panel     Status: None   Collection Time: 11/09/16  6:05 PM  Result Value Ref Range Status   Specimen Description BLOOD RIGHT HAND  Final   Special Requests BOTTLES DRAWN AEROBIC ONLY [place]  Final   Culture NO GROWTH 5 DAYS  Final   Report Status 11/14/2016 FINAL  Final  Cath Tip Culture     Status: None   Collection Time: 11/11/16  2:58 PM  Result Value Ref Range Status   Specimen Description CATH TIP  Final   Special Requests NONE  Final   Culture NO GROWTH 3 DAYS  Final   Report Status 11/15/2016 FINAL  Final    Coagulation Studies:  Recent Labs  11/20/16 0414 11/21/16 0544 11/22/16 0220  LABPROT 17.4* 17.6* 17.8*  INR 1.41 1.43 1.46    Urinalysis: No results for input(s): COLORURINE, LABSPEC, PHURINE, GLUCOSEU, HGBUR, BILIRUBINUR, KETONESUR, PROTEINUR, UROBILINOGEN, NITRITE, LEUKOCYTESUR in the last 72 hours.  Invalid input(s): APPERANCEUR    Imaging: No results found.   Medications:   . feeding supplement (VITAL AF 1.2 CAL) 1,000 mL (11/20/16 1730)   . amiodarone  200 mg Per Tube Daily  . atorvastatin  20 mg Per Tube q1800  .  ceFAZolin (ANCEF) IV  1 g Intravenous Q24H  . chlorhexidine gluconate (MEDLINE KIT)  15 mL Mouth Rinse BID  . clonazePAM  0.5 mg Per Tube BID  . clopidogrel  75 mg Per Tube Daily  . darbepoetin  (ARANESP) injection - DIALYSIS  100 mcg Intravenous Q Sat-HD  . docusate  100 mg Per Tube BID  . feeding supplement (PRO-STAT SUGAR FREE 64)  30 mL Oral BID  . guaiFENesin-codeine  5 mL Per Tube Q6H  . insulin aspart  0-9 Units Subcutaneous Q4H  . insulin aspart  4 Units Subcutaneous Q6H  . insulin detemir  10 Units Subcutaneous BID  . mouth rinse  15 mL Mouth Rinse QID  . metoCLOPramide  10 mg Per Tube BID  . midodrine  10 mg Per Tube q morning - 10a  . multivitamin  1 tablet Oral QHS  . pantoprazole sodium  40 mg Per Tube Daily  . rifampin  300 mg Per Tube Q8H  . sennosides  5 mL Per Tube BID  . sodium chloride flush  3 mL Intravenous Q12H  . warfarin  7.5 mg Oral ONCE-1800  . Warfarin - Pharmacist Dosing Inpatient   Does not apply q1800   sodium chloride, sodium chloride, alteplase, fentaNYL (SUBLIMAZE) injection, fluticasone, heparin, levalbuterol, lidocaine-prilocaine, ondansetron (ZOFRAN) IV, pentafluoroprop-tetrafluoroeth, prochlorperazine  Assessment/ Plan:  1. MSSA bacteremia/HD linesepsis: s/p HD cath removal, using L AVG. Refused TEE, empiric 6 wks IV abx per ID 2. Vocal cord paralysis, s/p repeat trach12/7: Remains on vent, weaning now 3. 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 No signs of recovery. Next HD 12/21 4. Anemia: Hgb 8.9. Continue Aranesp 173mg weekly. 5. MBD: Ca/Phos/PTH ok. No meds for now. 6. BP/Volume: OP EDW 56 kg. No edema on exam, run even on HD. CXR 12/8 without pulm edema. HD today. Net UF 500 post wt 55.7. Had issues with cramping. BP stable. May need to allow EDW to drift up.  7. Nutrition:  Albumin 1.7.Tube feeds ongoing, + prostat. Severe protein calorie malnutrition. 8. Hypophosphatemia: Phos 2.9. 9. Hypokalemia: 2 K bath  10. 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 Atrial fibrillation: Per primary. On amiodarone/warfarin. 12. DM: Per  primary    LOS: 14 Diontay Rosencrans W @TODAY @10 :38 AM

## 2016-11-22 NOTE — Progress Notes (Addendum)
PROGRESS NOTE    Deborah [name]  NTZ:[id number] DOB: Aug 29, 1956 DOA: 11/02/2016 PCP: Deborah Mast, MD   Brief Narrative:  82-yo BF PMHx Prolonged intubation requiring tracheostomy placement and subsequent decannulation, STEMI with Papillary Muscle Rupture requiring S/P surgical repair in September, Atrial Flutter S/P cardioversion on Coumadin. HTN, DM type II uncontrolled with complication, HLD, ESRD on HD (T/TH/Sat)  Patient reports her dyspnea has persisted even in rehabilitation. Reports intermittent nonproductive cough as well as intermittent wheezing. Denies any chest pain or tightness at this time. Denies any subjective fever, chills, or sweats. Reports she was using incentive spirometry but it's unclear whether or not this was effective. Patient presented with labored breathing today during hemodialysis session. She does endorse some orthopnea as well as paroxysmal nocturnal dyspnea.    Subjective: 12/21  patient appears to be A/O 4. Nods yes and no to questions appropriately. Appears comfortable. On vent. Daughter, Deborah [name] states not satisfied with care pain afford her here at [facility] requests transfer to [facility] as they believe able get better care and it will be decreased burden on the family i.e. elective transfer.     Assessment & Plan:   Principal Problem:   Staphylococcus aureus bacteremia Active Problems:   Mitral regurgitation   S/P MVR (mitral valve replacement)   HCAP (healthcare-associated pneumonia)   Diabetes mellitus type 2 in nonobese ([name])   ESRD on dialysis ([place])   Anemia of chronic disease   Atrial flutter ([name])   Respiratory distress   Vocal cord paralysis   Bacteremia   End-stage renal disease on hemodialysis ([name])   Paroxysmal atrial fibrillation ([place])   Heart attack   Uncontrolled type 2 diabetes mellitus with complication, without long-term current use of insulin [facility])   Advance care planning   Vocal cord dysfunction    Palliative care by specialist   Goals of care, counseling/discussion   Palliative care encounter  Vocal cord paralysis S/P repeat tracheostomy by ENT on 12/7 -Tracheostomy care per ENT. - Continuing ventilator wean &transition to tracheostomy collar. Patient still requiring ventilator support. -Daughter Deborah [name] unable to comprehend that mother requires ventilator support as well as HD and there are minimal facilities available to accept patient with both knees. -Daughter would like patient transferred to [facility] for second opinion. Per Deborah [name] ENT note from 12/21:  patient has fixation of both of her cords in the paramedian position thus creating a very narrow airway.  Injection would only make the situation worse. Medialization would also not be an option for same reason  as the cords are already medialized. The trach cannot come out until the vocal cords either begin to function or a surgical procedure is done to remove a portion of the vocal cord. It would not be recommended to laser or remove any portion of the cord until more time his past for the function of the vocal cords to potentially return.  HCAP/Bacteremia/Sepsis:  -Continue antibiotics per ID. -Repeat blood culture 12/7 negative   PEG stoma bleeding -Bleeding resolved 11/13/16 .   ESRD on HD (T/TH/Sat) -Per nephrology  Hypokalemia -Potassium goal> 4 -Potassium 30 mEq -Repeat K/Mg: At goal  Hypomagnesemia -Magnesium goal> 2 -Magnesium IV 2 g  Paroxysmal Atrial Fibrillation -Amiodarone  Bioprosthetic heart valve -  Recent Inferior STEMI:  -S/P DESx2. Continuing Plavix &Lipitor  Hypotension -Midodrin 10 mg daily  DM type II uncontrolled with complication -6/75 Hemoglobin A1c= 13.3 -Vital AF 1.2 CAL 50 ml/hr -Moderate SSI  Anxiety  Goals of care -Daughter Deborah [name] states that mother and family have agreed that not receiving best care at [facility] request transfer to [facility] in order to obtain better care. States they understand this is an elective transfer and that they will incur the cost of transportation. Also understand they need to provide name of physician who will accept patient at [facility].     DVT prophylaxis: Heparin drip Code Status: Full Family Communication: None   Disposition Plan: SNF   Consultants:  [facility]phrology ID Deborah [name] ENT   Procedures/Significant Events:  09/12 Admit to [facility] 09/15 To [facility] 09/20 MVR with bioprosthetic valve 09/28 Permcath for HD 09/29 Extubated 09/30 Reintubated 10/05 Trach by Deborah [name] 10/09 Off vent 10/25 decannulated 11/8 to CIR 11/29 DC CIR 11/30 admit for resp distress 127 - redo trach Tracheostomy 6.4 cuffed Janace [name]) 12/7 >> 12/09 L IJ Tunneled HD catheter removed . LUE graft being used 12/10 -  Patient did have some mild emesis overnight. Denies any abdominal pain or nausea at this time. Denies any chest pain or difficulty breathing at this time.   VENTILATOR SETTINGS:    Cultures Urine Ctx 11/27: Multiple species MRSA PCR 12/1: Positive Blood Ctx x2 12/5:  2/2 MSSA Blood Ctx x1 12/7 >> negative Catheter Tip Culture 12/9 >> negative    Antimicrobials: Anti-infectives    Start     Dose/Rate Route Frequency Ordered Stop   11/13/16 1410  ceFAZolin (ANCEF) 2-4 GM/100ML-% IVPB    Comments:  Deborah [name]   : cabinet override      11/13/16 1410 11/13/16 1432   11/11/16 1800  ceFAZolin (ANCEF) IVPB 1 g/50 mL premix     1 g 100 mL/hr over 30 Minutes Intravenous Every 24 hours 11/11/16 1425     11/10/16 1400  rifampin (RIFADIN) 60 mg/mL oral suspension 300 mg     300 mg Per Tube Every 8 hours 11/10/16 1029     11/10/16 0815  vancomycin (VANCOCIN) 500 mg in sodium chloride 0.9 % 100 mL IVPB     500 mg 100 mL/hr over 60 Minutes Intravenous  Once 11/10/16 0802 11/10/16 1143   11/09/16 1830  rifampin (RIFADIN) 60 mg/mL oral suspension 300 mg  Status:  Discontinued      300 mg Oral Every 8 hours 11/09/16 1711 11/10/16 1029   11/09/16 1200  vancomycin (VANCOCIN) 500 mg in sodium chloride 0.9 % 100 mL IVPB  Status:  Discontinued     500 mg 100 mL/hr over 60 Minutes Intravenous Every T-Th-Sa (Hemodialysis) 11/08/16 0919 11/11/16 1424   11/08/16 1000  ceFEPIme (MAXIPIME) 1 g in dextrose 5 % 50 mL IVPB  Status:  Discontinued     1 g 100 mL/hr over 30 Minutes Intravenous Every 24 hours 11/08/16 0919 11/11/16 1424   11/08/16 0930  vancomycin (VANCOCIN) 1,250 mg in sodium chloride 0.9 % 250 mL IVPB     1,250 mg 166.7 mL/hr over 90 Minutes Intravenous  Once 11/08/16 0919 11/08/16 1415       Devices    LINES / TUBES:      Continuous Infusions: . feeding supplement (VITAL AF 1.2 CAL) 1,000 mL (11/20/16 1730)     Objective: Vitals:   11/22/16 0821 11/22/16 0900 11/22/16 1207 11/22/16 1635  BP:  98/64 111/67 107/70  Pulse: 96 98 (!) 103 (!) 104  Resp: 18 20 (!) 24 (!) 27  Temp:  98.5 F (36.9 C) 97.6 F (36.4 C)   TempSrc:  Oral Oral   SpO2: 100% 100% 100% 99%  Weight:      Height:        Intake/Output Summary (Last 24 hours) at 11/22/16 1746 Last data filed at 11/22/16 0830  Gross per 24 hour  Intake            907.5 ml  Output                0 ml  Net            907.5 ml   Filed Weights   11/21/16 0821 11/21/16 1206 11/22/16 0315  Weight: 61.6 kg (135 lb 12.9 oz) 60.5 kg (133 lb 6.1 oz) 61.1 kg (134 lb 11.2 oz)    Examination:  General:A/O 4. Nods yes and no to questions appropriately, positive acute on chronic respiratory distress (vent dependent: Continued failure to wean) Eyes: negative scleral hemorrhage, negative anisocoria, negative icterus ENT: Negative Runny nose, negative gingival bleeding, Neck:  Negative scars, masses, torticollis, lymphadenopathy, JVD, tracheostomy present negative sign of infection. Lungs: Clear to auscultation bilaterally without wheezes or crackles Cardiovascular: Regular rate and rhythm without  murmur gallop or rub normal S1 and S2 Abdomen: negative abdominal pain, nondistended, positive soft, bowel sounds, no rebound, no ascites, no appreciable mass Extremities: No significant cyanosis, clubbing, or edema bilateral lower extremities Skin: Negative rashes, lesions, ulcers Psychiatric:  Negative depression, negative anxiety, negative fatigue, negative mania  Central nervous system:  Cranial nerves II through XII intact, tongue/uvula midline, all extremities muscle strength 5/5, sensation intact throughout, negative receptive aphasia.  .     Data Reviewed: Care during the described time interval was provided by me .  I have reviewed this patient's available data, including medical history, events of note, physical examination, and all test results as part of my evaluation. I have personally reviewed and interpreted all radiology studies.  CBC:  Recent Labs Lab 11/17/16 0945 11/18/16 0759 11/19/16 0415 11/21/16 0700 11/22/16 0220  WBC 17.7* 16.8* 17.0* 14.2* 13.8*  HGB 9.8* 8.8* 9.2* 8.6* 8.9*  HCT 31.2* 28.1* 30.0* 27.1* 29.1*  MCV 86.9 86.5 88.2 87.4 89.3  PLT 344 324 325 329 732   Basic Metabolic Panel:  Recent Labs Lab 11/16/16 0500 11/17/16 0945 11/18/16 0757 11/21/16 0534 11/21/16 0700  NA 134* 133* 131*  --  125*  K 3.2* 4.4 3.7  --  4.2  CL 95* 95* 95*  --  90*  CO2 27 27 26   --  25  GLUCOSE 147* 290* 318* 409* 434*  BUN 13 11 19   --  43*  CREATININE 4.10* 3.64* 4.36*  --  4.21*  CALCIUM 7.8* 8.2* 7.9*  --  8.1*  PHOS 3.4  --  2.9  --  2.6   GFR: Estimated Creatinine Clearance: 12.4 mL/min (by C-G formula based on SCr of 4.21 mg/dL (H)). Liver Function Tests:  Recent Labs Lab 11/16/16 0500 11/17/16 0945 11/18/16 0757 11/21/16 0700  AST  --  18  --   --   ALT  --  <5*  --   --   ALKPHOS  --  73  --   --   BILITOT  --  1.0  --   --   PROT  --  5.6*  --   --   ALBUMIN 1.8* 1.9* 1.7* 1.7*   No results for input(s): LIPASE, AMYLASE in the last  168 hours. No results for input(s): AMMONIA in the last 168 hours. Coagulation Profile:  Recent Labs  Lab 11/18/16 0500 11/19/16 0415 11/20/16 0414 11/21/16 0544 11/22/16 0220  INR 4.24* 1.95 1.41 1.43 1.46   Cardiac Enzymes: No results for input(s): CKTOTAL, CKMB, CKMBINDEX, TROPONINI in the last 168 hours. BNP (last 3 results) No results for input(s): PROBNP in the last 8760 hours. HbA1C: No results for input(s): HGBA1C in the last 72 hours. CBG:  Recent Labs Lab 11/21/16 2332 11/22/16 0414 11/22/16 0807 11/22/16 1128 11/22/16 1644  GLUCAP 304* 219* 318* 309* 353*   Lipid Profile: No results for input(s): CHOL, HDL, LDLCALC, TRIG, CHOLHDL, LDLDIRECT in the last 72 hours. Thyroid Function Tests: No results for input(s): TSH, T4TOTAL, FREET4, T3FREE, THYROIDAB in the last 72 hours. Anemia Panel: No results for input(s): VITAMINB12, FOLATE, FERRITIN, TIBC, IRON, RETICCTPCT in the last 72 hours. Urine analysis:    Component Value Date/Time   COLORURINE YELLOW 10/30/2016 2120   APPEARANCEUR CLOUDY (A) 10/30/2016 2120   LABSPEC 1.021 10/30/2016 2120   PHURINE 5.0 10/30/2016 2120   GLUCOSEU NEGATIVE 10/30/2016 2120   HGBUR NEGATIVE 10/30/2016 2120   BILIRUBINUR SMALL (A) 10/30/2016 2120   [place] NEGATIVE 10/30/2016 2120   PROTEINUR 30 (A) 10/30/2016 2120   NITRITE NEGATIVE 10/30/2016 2120   LEUKOCYTESUR SMALL (A) 10/30/2016 2120   Sepsis Labs: @LABRCNTIP (procalcitonin:4,lacticidven:4)  ) No results found for this or any previous visit (from the past 240 hour(s)).       Radiology Studies: No results found.      Scheduled Meds: . amiodarone  200 mg Per Tube Daily  . atorvastatin  20 mg Per Tube q1800  .  ceFAZolin (ANCEF) IV  1 g Intravenous Q24H  . chlorhexidine gluconate (MEDLINE KIT)  15 mL Mouth Rinse BID  . clonazePAM  0.5 mg Per Tube BID  . clopidogrel  75 mg Per Tube Daily  . darbepoetin (ARANESP) injection - DIALYSIS  100 mcg Intravenous Q  Sat-HD  . docusate  100 mg Per Tube BID  . feeding supplement (PRO-STAT SUGAR FREE 64)  30 mL Oral BID  . guaiFENesin-codeine  5 mL Per Tube Q6H  . insulin aspart  0-9 Units Subcutaneous Q4H  . insulin aspart  6 Units Subcutaneous Q6H  . insulin detemir  20 Units Subcutaneous BID  . mouth rinse  15 mL Mouth Rinse QID  . metoCLOPramide  10 mg Per Tube BID  . midodrine  10 mg Per Tube q morning - 10a  . multivitamin  1 tablet Oral QHS  . pantoprazole sodium  40 mg Per Tube Daily  . rifampin  300 mg Per Tube Q8H  . sennosides  5 mL Per Tube BID  . sodium chloride flush  3 mL Intravenous Q12H  . warfarin  7.5 mg Oral ONCE-1800  . Warfarin - Pharmacist Dosing Inpatient   Does not apply q1800   Continuous Infusions: . feeding supplement (VITAL AF 1.2 CAL) 1,000 mL (11/20/16 1730)     LOS: 20 days    Time spent: 40 minutes    WOODS, Geraldo Docker, MD [facility] Pager [phone]   If 7PM-7AM, please contact night-coverage [email or web address] Password [facility] 11/22/2016, 5:46 PM

## 2016-11-22 NOTE — Progress Notes (Addendum)
PROGRESS NOTE                                                                                                                                                                                                             Patient Demographics:    Deborah [name], is a 60 y.o. female, DOB - [birth year], ZOX:[id number]  Admit date - 11/02/2016   Admitting Physician Javier Glazier, MD  Outpatient Primary MD for the patient is Rica Mast, MD  LOS - 20  Outpatient Specialists:none  Chief Complaint  Patient presents with  . Shortness of Breath       Brief Narrative    60 y.o.femaleHxprolonged intubation requiring tracheostomy placement and subsequent decannulation,STEMI with Papillary Muscle Rupture requiring surgical repair in September, Atrial Flutter S/P cardioversion on Coumadin, HTN, DM2, HLD, and ESRD on HD (T/TH/Sat) who presented with labored breathing during her hemodialysissession.     Significant Events: 09/12 Admit to [facility] 09/15 Tx to [facility] 09/20 MVR with bioprosthetic valve 09/28 Permcath for HD 09/29 Extubated 09/30 Reintubated 10/05 Trach by Hyman Bible 10/09 Off vent 10/25 Decannulated 11/8 to CIR 11/29 DC from CIR 11/30 admit for resp distress 127 redo trach Tracheostomy 6.4 cuffed(Byers - ENT)  12/09 L IJ Tunneled HD catheter removed  12/11 PEG tube in IR - L IJ double lumen cath in IR    Subjective:   C/o pain over trach site   Assessment  & Plan :    Vocal cord paralysis  -Tracheostomy care and vent management per ENT/PCCM. - tracheostomy on 12/7 by Dr. [name], and a PEG tube placement on 12/11. -PT eval recommended SNF but Difficult discharge planning because of requiring ventilator support and hemodialysis treatment.  tolerating trach collar during the day.   MSSA Bacteremia  -suspect  HD cath related  - HD cath removed  on 12/7. - refused TEE therefore ID suggests 6 weeks  of abx w/ repeat blood cx 2 weeks after course completed . -Currently on IV cefazolin and rifampin. Antibiotic stop date after 12/20/2015.  Uncontrolled Type 2 diabetes with hyperglycemia:  CBG in 350s-400. Increased levemir and and aspart dose further today. On continuous tube feeding.  HCAP -Improved now. Currently on ventilator support. Trach collar as much tolerated  ESRD on HD (T/TH/Sat) -HD continues via AVG - renal following  -Monitor electrolytes, blood pressure.  Paroxysmal Atrial Fibrillation -Continue amiodarone for rate control. On Coumadin for systemic anticoagulation. Monitor INR. INR is subtherapeutic today. Pharmacy is dosing Coumadin.  Bioprosthetic Mitral valve ID recommended TEE but pt refused. Now treating empirically  Chronic Hypotension -Continue midodrine. stable  Inferior STEMIS/P DES x 05 August 2016 -continue Plavix, Lipitor   Anxiety - supportive care.  Anemia of chr kidney disease. On aranesp and iron with HD.     Code Status : full code  Family Communication  : daughter at bedside  Disposition Plan  : awaiting approval for SNF at [facility], daughter requested to reapply for medicaid in [place]  Barriers For Discharge : placement, may stay here until next week  Consults  :  PCCM  ENT  Procedures  :  See above  DVT Prophylaxis  :  Lovenox  Lab Results  Component Value Date   PLT 359 11/22/2016    Antibiotics  :    Anti-infectives    Start     Dose/Rate Route Frequency Ordered Stop   11/13/16 1410  ceFAZolin (ANCEF) 2-4 GM/100ML-% IVPB    Comments:  Shaaron Adler   : cabinet override      11/13/16 1410 11/13/16 1432   11/11/16 1800  ceFAZolin (ANCEF) IVPB 1 g/50 mL premix     1 g 100 mL/hr over 30 Minutes Intravenous Every 24 hours 11/11/16 1425     11/10/16 1400  rifampin (RIFADIN) 60 mg/mL oral suspension 300 mg     300 mg Per Tube Every 8 hours 11/10/16 1029     11/10/16 0815  vancomycin (VANCOCIN) 500 mg in  sodium chloride 0.9 % 100 mL IVPB     500 mg 100 mL/hr over 60 Minutes Intravenous  Once 11/10/16 0802 11/10/16 1143   11/09/16 1830  rifampin (RIFADIN) 60 mg/mL oral suspension 300 mg  Status:  Discontinued     300 mg Oral Every 8 hours 11/09/16 1711 11/10/16 1029   11/09/16 1200  vancomycin (VANCOCIN) 500 mg in sodium chloride 0.9 % 100 mL IVPB  Status:  Discontinued     500 mg 100 mL/hr over 60 Minutes Intravenous Every T-Th-Sa (Hemodialysis) 11/08/16 0919 11/11/16 1424   11/08/16 1000  ceFEPIme (MAXIPIME) 1 g in dextrose 5 % 50 mL IVPB  Status:  Discontinued     1 g 100 mL/hr over 30 Minutes Intravenous Every 24 hours 11/08/16 0919 11/11/16 1424   11/08/16 0930  vancomycin (VANCOCIN) 1,250 mg in sodium chloride 0.9 % 250 mL IVPB     1,250 mg 166.7 mL/hr over 90 Minutes Intravenous  Once 11/08/16 0919 11/08/16 1415        Objective:   Vitals:   11/22/16 0821 11/22/16 0900 11/22/16 1207 11/22/16 1635  BP:  98/64 111/67 107/70  Pulse: 96 98 (!) 103 (!) 104  Resp: 18 20 (!) 24 (!) 27  Temp:  98.5 F (36.9 C) 97.6 F (36.4 C)   TempSrc:  Oral Oral   SpO2: 100% 100% 100% 99%  Weight:      Height:        Wt Readings from Last 3 Encounters:  11/22/16 61.1 kg (134 lb 11.2 oz)  10/11/16 58.1 kg (128 lb 1.4 oz)  08/18/16 71.2 kg (156 lb 15.5 oz)     Intake/Output Summary (Last 24 hours) at 11/22/16 1703 Last data filed at 11/22/16 0830  Gross per 24 hour  Intake          1133.33 ml  Output  0 ml  Net          1133.33 ml     Physical Exam  Gen: not in distress. fatigued HEENT: trach site clean,  moist mucosa, supple neck Chest: clear b/l, no added sounds CVS: N S1&S2, no murmurs GI: soft, NT, ND, BS+, G tube+ Musculoskeletal: warm, no edema CNS: alert and awake, nonverbal due to trach    Data Review:    CBC  Recent Labs Lab 11/17/16 0945 11/18/16 0759 11/19/16 0415 11/21/16 0700 11/22/16 0220  WBC 17.7* 16.8* 17.0* 14.2* 13.8*  HGB 9.8*  8.8* 9.2* 8.6* 8.9*  HCT 31.2* 28.1* 30.0* 27.1* 29.1*  PLT 344 324 325 329 359  MCV 86.9 86.5 88.2 87.4 89.3  MCH 27.3 27.1 27.1 27.7 27.3  MCHC 31.4 31.3 30.7 31.7 30.6  RDW 19.0* 19.1* 19.5* 19.5* 20.0*    Chemistries   Recent Labs Lab 11/16/16 0500 11/17/16 0945 11/18/16 0757 11/21/16 0534 11/21/16 0700  NA 134* 133* 131*  --  125*  K 3.2* 4.4 3.7  --  4.2  CL 95* 95* 95*  --  90*  CO2 _0 --  25  GLUCOSE 147* 290* 318* 409* 434*  BUN _1 --  43*  CREATININE 4.10* 3.64* 4.36*  --  4.21*  CALCIUM 7.8* 8.2* 7.9*  --  8.1*  AST  --  18  --   --   --   ALT  --  <5*  --   --   --   ALKPHOS  --  73  --   --   --   BILITOT  --  1.0  --   --   --    ------------------------------------------------------------------------------------------------------------------ No results for input(s): CHOL, HDL, LDLCALC, TRIG, CHOLHDL, LDLDIRECT in the last 72 hours.  Lab Results  Component Value Date   HGBA1C 13.3 (H) 08/15/2016   ------------------------------------------------------------------------------------------------------------------ No results for input(s): TSH, T4TOTAL, T3FREE, THYROIDAB in the last 72 hours.  Invalid input(s): FREET3 ------------------------------------------------------------------------------------------------------------------ No results for input(s): VITAMINB12, FOLATE, FERRITIN, TIBC, IRON, RETICCTPCT in the last 72 hours.  Coagulation profile  Recent Labs Lab 11/18/16 0500 11/19/16 0415 11/20/16 0414 11/21/16 0544 11/22/16 0220  INR 4.24* 1.95 1.41 1.43 1.46    No results for input(s): DDIMER in the last 72 hours.  Cardiac Enzymes No results for input(s): CKMB, TROPONINI, MYOGLOBIN in the last 168 hours.  Invalid input(s): CK ------------------------------------------------------------------------------------------------------------------    Component Value Date/Time   BNP 2,436.0 (H) 08/15/2016 1906    Inpatient  Medications  Scheduled Meds: . amiodarone  200 mg Per Tube Daily  . atorvastatin  20 mg Per Tube q1800  .  ceFAZolin (ANCEF) IV  1 g Intravenous Q24H  . chlorhexidine gluconate (MEDLINE KIT)  15 mL Mouth Rinse BID  . clonazePAM  0.5 mg Per Tube BID  . clopidogrel  75 mg Per Tube Daily  . darbepoetin (ARANESP) injection - DIALYSIS  100 mcg Intravenous Q Sat-HD  . docusate  100 mg Per Tube BID  . feeding supplement (PRO-STAT SUGAR FREE 64)  30 mL Oral BID  . guaiFENesin-codeine  5 mL Per Tube Q6H  . insulin aspart  0-9 Units Subcutaneous Q4H  . insulin aspart  4 Units Subcutaneous Q6H  . insulin detemir  10 Units Subcutaneous BID  . mouth rinse  15 mL Mouth Rinse QID  . metoCLOPramide  10 mg Per Tube BID  . midodrine  10 mg Per Tube q morning -  10a  . multivitamin  1 tablet Oral QHS  . pantoprazole sodium  40 mg Per Tube Daily  . rifampin  300 mg Per Tube Q8H  . sennosides  5 mL Per Tube BID  . sodium chloride flush  3 mL Intravenous Q12H  . warfarin  7.5 mg Oral ONCE-1800  . Warfarin - Pharmacist Dosing Inpatient   Does not apply q1800   Continuous Infusions: . feeding supplement (VITAL AF 1.2 CAL) 1,000 mL (11/20/16 1730)   PRN Meds:.sodium chloride, sodium chloride, alteplase, fentaNYL (SUBLIMAZE) injection, fluticasone, heparin, levalbuterol, lidocaine-prilocaine, ondansetron (ZOFRAN) IV, pentafluoroprop-tetrafluoroeth, prochlorperazine  Micro Results No results found for this or any previous visit (from the past 240 hour(s)).  Radiology Reports Dg Chest 2 View  Result Date: 11/08/2016 CLINICAL DATA:  Short of breath, hypertension, diabetes EXAM: CHEST  2 VIEW COMPARISON:  Portable chest x-ray of 11/07/2016 FINDINGS: Opacity remains at the left arm right lung base with apparent air bronchograms most consistent with left lower lobe pneumonia. Mild atelectasis at the right lung base remains. No definite pleural effusion is seen. Cardiomegaly is stable. Left central venous line  is unchanged in position. Mitral valve replacement is present. IMPRESSION: 1. Persistent left lower lobe opacity most suspicious for pneumonia. 2. Stable cardiomegaly with central venous line. 3. Right basilar linear atelectasis. Electronically Signed   By: Ivar Drape M.D.   On: 11/08/2016 08:39   Dg Chest 2 View  Result Date: 11/01/2016 CLINICAL DATA:  Wheezing, shortness of breath, cough. EXAM: CHEST  2 VIEW COMPARISON:  Radiographs of October 30, 2016. FINDINGS: Stable cardiomegaly. Status post cardiac valve repair. Left internal jugular dialysis catheter is unchanged in position. No pneumothorax or pleural effusion is noted. No acute pulmonary disease is noted. Bony thorax is unremarkable. IMPRESSION: No active cardiopulmonary disease. Electronically Signed   By: Marijo Conception, M.D.   On: 11/01/2016 07:25   Dg Chest 2 View  Result Date: 10/30/2016 CLINICAL DATA:  Cough and leukocytosis EXAM: CHEST  2 VIEW COMPARISON:  Chest radiograph October 15, 2016 and chest CT October 21, 2016 FINDINGS: There is persistent consolidation in the medial bases bilaterally, slightly more although left than on the right. No new parenchymal lung opacity evident. Heart is borderline prominent pulmonary vascularity within normal limits. No adenopathy. Port-A-Cath tip is at the cavoatrial junction. No pneumothorax. There is a mitral valve replacement. No bone lesions. IMPRESSION: Bibasilar consolidation. No new opacity. Stable cardiac silhouette. Electronically Signed   By: Lowella Grip III M.D.   On: 10/30/2016 10:46   Ct Chest Wo Contrast  Result Date: 11/18/2016 CLINICAL DATA:  Hospital acquired pneumonia.  Ventilator. EXAM: CT CHEST WITHOUT CONTRAST TECHNIQUE: Multidetector CT imaging of the chest was performed following the standard protocol without IV contrast. COMPARISON:  11/10/2016 chest x-ray.  Chest CT 10/21/2016 FINDINGS: Cardiovascular: Heart is mildly enlarged. Prior mitral valve repair. Left  dialysis catheter in place with the tip in the upper right atrium. Aorta is normal caliber. Mediastinum/Nodes: No mediastinal, hilar, or axillary adenopathy. Lungs/Pleura: Tracheostomy tube in place. Dependent opacity in the right lower lobe, atelectasis versus pneumonia. More dense consolidation in the left lower lobe concerning for pneumonia. No pleural effusions. Upper Abdomen: Imaging into the upper abdomen shows no acute findings. Layering high-density material in the gallbladder may reflect small stones. Musculoskeletal: Chest wall soft tissues are unremarkable. No acute bony abnormality or focal bone lesion. IMPRESSION: Dense consolidation in the left lower lobe compatible with pneumonia. Right lower lobe atelectasis or infiltrate.  Mild cardiomegaly. Tracheostomy tube in place. Question small layering stones in the gallbladder. Electronically Signed   By: Rolm Baptise M.D.   On: 11/18/2016 07:15   Ir Gastrostomy Tube Mod Sed  Result Date: 11/13/2016 INDICATION: 60 year old female with dysphagia. EXAM: PERC PLACEMENT GASTROSTOMY MEDICATIONS: 2.0 g Ancef; Antibiotics were administered within 1 hour of the procedure. ANESTHESIA/SEDATION: Versed 2.0 mg IV; Fentanyl 100 mcg IV Moderate Sedation Time:  10 The patient was continuously monitored during the procedure by the interventional radiology nurse under my direct supervision. CONTRAST:  10 cc - administered into the gastric lumen. FLUOROSCOPY TIME:  Fluoroscopy Time: 0 minutes 42 seconds (1 mGy). COMPLICATIONS: None PROCEDURE: Informed written consent was obtained from the patient and the patient's family after a thorough discussion of the procedural risks, benefits and alternatives. All questions were addressed. Maximal Sterile Barrier Technique was utilized including caps, mask, sterile gowns, sterile gloves, sterile drape, hand hygiene and skin antiseptic. A timeout was performed prior to the initiation of the procedure. The procedure, risks, benefits,  and alternatives were explained to the patient. Questions regarding the procedure were encouraged and answered. The patient understands and consents to the procedure. The epigastrium was prepped with Betadine in a sterile fashion, and a sterile drape was applied covering the operative field. A sterile gown and sterile gloves were used for the procedure. A 5-French orogastric tube is placed under fluoroscopic guidance. Scout imaging of the abdomen confirms barium within the transverse colon. The stomach was distended with gas. Under fluoroscopic guidance, an 18 gauge needle was utilized to puncture the anterior wall of the body of the stomach. An Amplatz wire was advanced through the needle passing a T fastener into the lumen of the stomach. The T fastener was secured for gastropexy. A 9-French sheath was inserted. A snare was advanced through the 9-French sheath. A Britta Mccreedy was advanced through the orogastric tube. It was snared then pulled out the oral cavity, pulling the snare, as well. The leading edge of the gastrostomy was attached to the snare. It was then pulled down the esophagus and out the percutaneous site. It was secured in place. Contrast was injected. No complication IMPRESSION: Status post fluoroscopic placed percutaneous gastrostomy tube, with 20 [place] pull-through. Signed, Dulcy Fanny. Earleen Newport, DO [facility] Radiology Electronically Signed   By: Corrie Mckusick D.O.   On: 11/13/2016 15:02   Ir Fluoro Guide Cv Line Left  Result Date: 11/13/2016 INDICATION: 61 year old female with bacteremia.  Renal failure. She has been referred for tunneled central catheter placement. EXAM: IMAGE GUIDED TUNNELED CENTRAL VENOUS LINE MEDICATIONS: 2.0 g Ancef; The antibiotic was administered within an appropriate time interval prior to skin puncture. ANESTHESIA/SEDATION: None FLUOROSCOPY TIME:  Fluoroscopy Time: 0 minutes 18 seconds COMPLICATIONS: None PROCEDURE: After  written informed consent was obtained, patient was placed in the supine position on angiographic table. Patency of the left internal jugular vein was confirmed with ultrasound with image documentation. Patient was prepped and draped in the usual sterile fashion including the right neck and right superior chest. Using ultrasound guidance, the skin and subcutaneous tissues overlying the right internal jugular vein were generously infiltrated with 1% lidocaine without epinephrine. Using ultrasound guidance, the right internal jugular vein was punctured with a micropuncture needle, and an 018 wire was advanced into the right heart confirming venous access. A small stab incision was made with an 11 blade scalpel. Peel-away sheath was placed over the wire, and then the wire was removed, marking the  wire for estimation of internal catheter length. The chest wall was then generously infiltrated with 1% lidocaine for local anesthesia along the tissue tract. Small stab incision was made with 11 blade scalpel, and then the catheter was back tunneled to the puncture site at the right internal jugular vein. Catheter was pulled through the tract, with the catheter amputated at 22 cm. Catheter was advanced through the peel-away sheath, and the peel-away sheath was removed. Final image was stored. The catheter was anchored to the chest wall with 2 retention sutures, and Derma bond was used to seal the right internal jugular vein incision site and at the right chest wall. Patient tolerated the procedure well and remained hemodynamically stable throughout. No complications were encountered and no significant blood loss was encountered. IMPRESSION: Status post left IJ central venous catheter with a cuffed double lumen 22 cm catheter. Catheter ready for use. Signed, Dulcy Fanny. Earleen Newport, DO [facility] Radiology Electronically Signed   By: Corrie Mckusick D.O.   On: 11/13/2016 15:01   Ir US  Guide Vasc Access Left  Result Date: 11/13/2016 INDICATION: 60 year old female with bacteremia.  Renal failure. She has been referred for tunneled central catheter placement. EXAM: IMAGE GUIDED TUNNELED CENTRAL VENOUS LINE MEDICATIONS: 2.0 g Ancef; The antibiotic was administered within an appropriate time interval prior to skin puncture. ANESTHESIA/SEDATION: None FLUOROSCOPY TIME:  Fluoroscopy Time: 0 minutes 18 seconds COMPLICATIONS: None PROCEDURE: After written informed consent was obtained, patient was placed in the supine position on angiographic table. Patency of the left internal jugular vein was confirmed with ultrasound with image documentation. Patient was prepped and draped in the usual sterile fashion including the right neck and right superior chest. Using ultrasound guidance, the skin and subcutaneous tissues overlying the right internal jugular vein were generously infiltrated with 1% lidocaine without epinephrine. Using ultrasound guidance, the right internal jugular vein was punctured with a micropuncture needle, and an 018 wire was advanced into the right heart confirming venous access. A small stab incision was made with an 11 blade scalpel. Peel-away sheath was placed over the wire, and then the wire was removed, marking the wire for estimation of internal catheter length. The chest wall was then generously infiltrated with 1% lidocaine for local anesthesia along the tissue tract. Small stab incision was made with 11 blade scalpel, and then the catheter was back tunneled to the puncture site at the right internal jugular vein. Catheter was pulled through the tract, with the catheter amputated at 22 cm. Catheter was advanced through the peel-away sheath, and the peel-away sheath was removed. Final image was stored. The catheter was anchored to the chest wall with 2 retention sutures, and Derma bond was used to seal the right internal jugular vein incision site and at the right chest wall.  Patient tolerated the procedure well and remained hemodynamically stable throughout. No complications were encountered and no significant blood loss was encountered. IMPRESSION: Status post left IJ central venous catheter with a cuffed double lumen 22 cm catheter. Catheter ready for use. Signed, Dulcy Fanny. Earleen Newport, DO [facility] Radiology Electronically Signed   By: Corrie Mckusick D.O.   On: 11/13/2016 15:01   Dg Chest Port 1 View  Result Date: 11/10/2016 CLINICAL DATA:  Vent trach pt sob assess lines and tubes EXAM: PORTABLE CHEST 1 VIEW COMPARISON:  11/09/2016 FINDINGS: Tracheostomy tube appears unchanged, approximately 6 cm above the carina. Left-sided dual lumen catheter tip overlies the level of  the lower superior vena cava. Patient has had median sternotomy and valve replacement. A nasogastric tube is in place, tip off the image beyond the gastroesophageal junction. Small amount of contrast is identified in the left upper quadrant of the abdomen. The heart is enlarged. There is minimal opacity at the left lung base which is similar in appearance to previous exams. No evidence for significant pulmonary edema. IMPRESSION: 1. Lines and tubes as described. 2. Persistent left lower lobe opacity. 3. Cardiomegaly without evidence for pulmonary edema. Electronically Signed   By: Nolon Nations M.D.   On: 11/10/2016 08:03   Dg Chest Port 1 View  Result Date: 11/09/2016 CLINICAL DATA:  Tracheostomy placement EXAM: PORTABLE CHEST 1 VIEW COMPARISON:  11/08/2016 FINDINGS: Heart size is top-normal. Retrocardiac opacity persists which may represent chronic atelectasis and/or infiltrate. No overt pulmonary edema. Patient status post mitral valvular replacement. Median sternotomy sutures are in place. Left IJ dialysis catheter tip is stable in at the cavoatrial junction. A tracheostomy tube tip is seen centered over the upper third of the trachea with tip approximately  4.8 cm above the carina. Gastric tube extends into the stomach. IMPRESSION: Satisfactory support line and tube positions. Chronic retrocardiac opacity which may reflect atelectasis and/or pneumonia. Electronically Signed   By: Ashley Royalty M.D.   On: 11/09/2016 19:11   Dg Chest Port 1 View  Result Date: 11/07/2016 CLINICAL DATA:  Dyspnea, hypertension, myocardial infarction and diabetes. EXAM: PORTABLE CHEST 1 VIEW COMPARISON:  11/04/2016 FINDINGS: There is stable mild cardiomegaly. The patient is status post mitral valvular replacement. Median sternotomy sutures are noted. Dialysis catheter from left IJ approach is noted with tip at the cavoatrial junction. No pneumothorax. Retrocardiac opacities are seen suspicious for left lower lobe pneumonia/atelectasis and probable small layering effusion. IMPRESSION: Retrocardiac opacity suspicious for pneumonia and/or atelectasis. Small layering left effusion is not entirely excluded. Otherwise no significant change. Electronically Signed   By: Ashley Royalty M.D.   On: 11/07/2016 20:54   Dg Chest Port 1 View  Result Date: 11/04/2016 CLINICAL DATA:  Acute respiratory failure with hypoxia. EXAM: PORTABLE CHEST 1 VIEW COMPARISON:  11/02/2016. FINDINGS: Cardiomegaly. Prior median sternotomy for valvular disease. Dual lumen catheter from LEFT IJ approach lies with tips in the proximal RA. No infiltrates or edema. No effusion or pneumothorax. Mild subsegmental atelectasis may be present in the retrocardiac region. IMPRESSION: Cardiomegaly.  Dual lumen catheter in satisfactory position. Slight increase in retrocardiac opacity could represent subsegmental atelectasis. Electronically Signed   By: Staci Righter M.D.   On: 11/04/2016 11:11   Dg Chest Portable 1 View  Result Date: 11/02/2016 CLINICAL DATA:  Shortness of breath and tachypnea EXAM: PORTABLE CHEST 1 VIEW COMPARISON:  Yesterday FINDINGS: Dialysis catheter on the left with tip at the upper right atrium. Chronic  cardiomegaly, status post mitral valve replacement. There is no edema, consolidation, effusion, or pneumothorax. IMPRESSION: No evidence of acute disease. Electronically Signed   By: Monte Fantasia M.D.   On: 11/02/2016 18:29   Dg Abd Portable 1v  Result Date: 11/09/2016 CLINICAL DATA:  60 y/o  F; enteric tube placement. EXAM: PORTABLE ABDOMEN - 1 VIEW COMPARISON:  10/07/2016 abdominal radiograph. FINDINGS: Retained contrast in the colon. Enteric tube tip projects over the mid stomach. Partially visualized median sternotomy and valve replacement. Right central venous catheter tip projects over cavoatrial junction. IMPRESSION: Enteric tube tip projects over mid stomach. Electronically Signed   By: Kristine Garbe M.D.   On: 11/09/2016 19:00  Time Spent in minutes  25   Louellen Molder M.D on 11/22/2016 at 5:03 PM  Between 7am to 7pm - Pager - [phone]  After 7pm go to [email or web address] - password [facility]  [facility] -  Office  [phone]

## 2016-11-22 NOTE — Progress Notes (Signed)
CSW spoke with patient and family today in regards to [place] [facility]- dtr [name] at bedside and continues to questions why we cannot find closer facility- CSW reinforced that there are no other vent/trach and dialysis compatible facilities   [name] states [name] would be best dtr to speak to because she worked on [employer] for patient  Verona spoke with [name] who is also not happy about lack of choices but will call CSW tomorrow during lunch break to discuss- CSW informed [name] of facility name and she will research.  CSW will continue to follow  Jorge Ny, LCSW Clinical Social Worker [phone]

## 2016-11-22 NOTE — Progress Notes (Signed)
Placed pt back on vent due to pt getting tired.  Pt will rest on ventilator with full support tonight and proceed with ATC in the morning.

## 2016-11-22 NOTE — Progress Notes (Signed)
Nutrition Follow Up  DOCUMENTATION CODES:   Non-severe (moderate) malnutrition in context of chronic illness  INTERVENTION:    Continue Vital AF 1.2 formula at goal rate of 50 ml/hr to provide 1440 kcals, 90 gm protein, 973 ml free water daily  NUTRITION DIAGNOSIS:   Malnutrition related to chronic illness as evidenced by mild depletion of body fat, mild depletion of muscle mass, percent weight loss (9% weight loss within one month), ongoing  GOAL:   Patient will meet greater than or equal to 90% of their needs, met  MONITOR:   Vent status, Labs, TF tolerance, I & O's  ASSESSMENT:   60 year old female with recent prolonged hospitalization secondary to papillary muscle rupture and subsequent HCAP. She was intubated requiring trach. Decannulated in the end of October. She then went to [facility] and was discharged 11/29. No presenting to [facility] ED 11/30 with compalints of SOB x1-2 weeks. She was seen by ENT they found cords were not opening from either scarring, inflammation, or nerve issue. They were hoping she would improve in the coming days, however, she did not and was taken for tracheostomy 12/7.   Patient is currently on ventilator support >> trach MV: 7.2 L/min Temp (24hrs), Avg:98.5 F (36.9 C), Min:97.6 F (36.4 C), Max:99 F (37.2 C)  Vital AF 1.2 currently infusing at goal rate of 50 ml/hr via PEG tube >> tolerating well. ESRD new to HD since 08/2016 >> next HD 12/21. Weight fluctuating but stable. Speech Path following. CBG's [phone].   Diet Order:  Diet NPO time specified  Skin:  Wound (see comment) (stage II to sacrum)  Last BM:  12/13  Height:   Ht Readings from Last 1 Encounters:  11/10/16 5' 2"  (1.575 m)    Weight:  Wt Readings from Last 1 Encounters:  11/22/16 134 lb 11.2 oz (61.1 kg)   12/19  133 lb 12/18  132 lb 12/17  130 lb 12/16  125 lb 12/15  125 lb 12/14  125 lb 12/13  123 lb 12/12  123 lb 12/11  120 lb 12/10  121 lb 12/09  123  lb 12/08  117 lb  Ideal Body Weight:  50 kg  BMI:  Body mass index is 24.64 kg/m.  Estimated Nutritional Needs (using EDW):  Kcal:  1300-1500  Protein:  80-90 gm  Fluid:  >/= 1.5 L  EDUCATION NEEDS:   No education needs identified at this time  Arthur Holms, RD, LDN Pager #: [phone] After-Hours Pager #: [phone]

## 2016-11-22 NOTE — Progress Notes (Signed)
ANTICOAGULATION CONSULT NOTE - Follow Up Consult  Pharmacy Consult for warfarin  Indication: atrial fibrillation and bioprosthetic MVR  No Known Allergies  Patient Measurements: Height: 5\' 2"  (157.5 cm) Weight: 134 lb 11.2 oz (61.1 kg) IBW/kg (Calculated) : 50.1  Vital Signs: Temp: 98.1 F (36.7 C) (12/20 0400) Temp Source: Oral (12/20 0400) BP: 108/68 (12/20 0600) Pulse Rate: 96 (12/20 0821)  Labs:  Recent Labs  11/20/16 0414 11/21/16 0544 11/21/16 0700 11/22/16 0220  HGB  --   --  8.6* 8.9*  HCT  --   --  27.1* 29.1*  PLT  --   --  329 359  LABPROT 17.4* 17.6*  --  17.8*  INR 1.41 1.43  --  1.46  CREATININE  --   --  4.21*  --     Estimated Creatinine Clearance: 12.4 mL/min (by C-G formula based on SCr of 4.21 mg/dL (H)).   Assessment: 60 yo F presents on 11/30 with SOB. 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 Admit INR 1.95 Warfarin re-started 12/13. INR 1.46  PTA dose 4mg  M/W/F and 2mg  T/T/S/S   Nephro: ESRD on HD TTS via AV graft, removed L IJ TDC on 12/9. Last HD 12/16 (3.5 hrs @ BFR 400), Na 131, other lytes ok. - ferric gluconate with HD, midodrine daily, rena-vit  Heme/Onc: H&H 8.9/29.1, Plt 359  Goal of Therapy:  INR 2-3 Monitor platelets by anticoagulation protocol: Yes   Plan:  - Warfarin 7.5 mg x1 tonight - Monitor daily INR, CBC and signs/symptoms of further bleeding, watch DDI (rifampin probably greater than amio)  Levester Fresh, PharmD, BCPS, BCCCP Clinical Pharmacist Clinical phone for 11/22/2016 from 7a-3:30p: [phone] If after 3:30p, please call main pharmacy at: [phone] 11/22/2016 9:00 AM

## 2016-11-22 NOTE — Progress Notes (Signed)
Inpatient Diabetes Program Recommendations  AACE/ADA: New Consensus Statement on Inpatient Glycemic Control (2015)  Target Ranges:  Prepandial:   less than 140 mg/dL      Peak postprandial:   less than 180 mg/dL (1-2 hours)      Critically ill patients:  140 - 180 mg/dL   Lab Results  Component Value Date   GLUCAP 309 (H) 11/22/2016   HGBA1C 13.3 (H) 08/15/2016   Review of Glycemic Control  Diabetes history: DM 2 Outpatient Diabetes medications: Levemir 8 units QHS Current orders for Inpatient glycemic control: Levemir 10 units BID, Novolog Sensitive Q4hours, Novolog 4 units Q4 Tube Feed Coverage  Inpatient Diabetes Program Recommendations:   Glucose still elevated mostly in the 300's. Please consider increasing Levemir to 15 units BID.  Thanks,  Tama Headings RN, MSN, [facility] Inpatient Diabetes Coordinator Team Pager [phone] (8a-5p)

## 2016-11-23 ENCOUNTER — Inpatient Hospital Stay (HOSPITAL_COMMUNITY): Payer: Medicaid Other

## 2016-11-23 DIAGNOSIS — I2101 ST elevation (STEMI) myocardial infarction involving left main coronary artery: Secondary | ICD-10-CM

## 2016-11-23 DIAGNOSIS — J9601 Acute respiratory failure with hypoxia: Secondary | ICD-10-CM

## 2016-11-23 DIAGNOSIS — A4101 Sepsis due to Methicillin susceptible Staphylococcus aureus: Principal | ICD-10-CM

## 2016-11-23 LAB — BASIC METABOLIC PANEL
ANION GAP: 7 (ref 5–15)
BUN: 35 mg/dL — ABNORMAL HIGH (ref 6–20)
CALCIUM: 8.4 mg/dL — AB (ref 8.9–10.3)
CO2: 31 mmol/L (ref 22–32)
Chloride: 94 mmol/L — ABNORMAL LOW (ref 101–111)
Creatinine, Ser: 3.4 mg/dL — ABNORMAL HIGH (ref 0.44–1.00)
GFR, EST AFRICAN AMERICAN: 16 mL/min — AB (ref >60)
GFR, EST NON AFRICAN AMERICAN: 14 mL/min — AB (ref >60)
GLUCOSE: 169 mg/dL — AB (ref 65–99)
POTASSIUM: 4.5 mmol/L (ref 3.5–5.1)
Sodium: 132 mmol/L — ABNORMAL LOW (ref 135–145)

## 2016-11-23 LAB — GLUCOSE, CAPILLARY
GLUCOSE-CAPILLARY: 126 mg/dL — AB (ref 65–99)
GLUCOSE-CAPILLARY: 188 mg/dL — AB (ref 65–99)
Glucose-Capillary: 162 mg/dL — ABNORMAL HIGH (ref 65–99)
Glucose-Capillary: 166 mg/dL — ABNORMAL HIGH (ref 65–99)
Glucose-Capillary: 228 mg/dL — ABNORMAL HIGH (ref 65–99)
Glucose-Capillary: 317 mg/dL — ABNORMAL HIGH (ref 65–99)

## 2016-11-23 LAB — RENAL FUNCTION PANEL
ALBUMIN: 1.7 g/dL — AB (ref 3.5–5.0)
ANION GAP: 9 (ref 5–15)
BUN: 37 mg/dL — ABNORMAL HIGH (ref 6–20)
CALCIUM: 8.7 mg/dL — AB (ref 8.9–10.3)
CO2: 27 mmol/L (ref 22–32)
Chloride: 95 mmol/L — ABNORMAL LOW (ref 101–111)
Creatinine, Ser: 3.47 mg/dL — ABNORMAL HIGH (ref 0.44–1.00)
GFR calc non Af Amer: 13 mL/min — ABNORMAL LOW (ref >60)
GFR, EST AFRICAN AMERICAN: 16 mL/min — AB (ref >60)
GLUCOSE: 197 mg/dL — AB (ref 65–99)
PHOSPHORUS: 2.3 mg/dL — AB (ref 2.5–4.6)
POTASSIUM: 4.6 mmol/L (ref 3.5–5.1)
SODIUM: 131 mmol/L — AB (ref 135–145)

## 2016-11-23 LAB — CBC
HEMATOCRIT: 27.5 % — AB (ref 36.0–46.0)
HEMOGLOBIN: 8.5 g/dL — AB (ref 12.0–15.0)
MCH: 27.6 pg (ref 26.0–34.0)
MCHC: 30.9 g/dL (ref 30.0–36.0)
MCV: 89.3 fL (ref 78.0–100.0)
Platelets: 361 10*3/uL (ref 150–400)
RBC: 3.08 MIL/uL — AB (ref 3.87–5.11)
RDW: 20.4 % — ABNORMAL HIGH (ref 11.5–15.5)
WBC: 13.6 10*3/uL — ABNORMAL HIGH (ref 4.0–10.5)

## 2016-11-23 LAB — PROTIME-INR
INR: 1.35
PROTHROMBIN TIME: 16.8 s — AB (ref 11.4–15.2)

## 2016-11-23 MED ORDER — WARFARIN SODIUM 5 MG PO TABS
7.5000 mg | ORAL_TABLET | Freq: Once | ORAL | Status: AC
  Administered 2016-11-23: 7.5 mg via ORAL
  Filled 2016-11-23: qty 2

## 2016-11-23 NOTE — Care Management Note (Signed)
Case Management Note  Patient Details  Name: Deborah [name] MRN: [id number] Date of Birth: 03-14-1956  Subjective/Objective:    Staff RN requested CM talk with pt and dtr about transfer to [facility].  Dtr, [name], states her sister called [facility] and they informed her they would take pt in transfer and they would arrange transportation.  Advised dtr that physician would need to accept pt and facility would need to assign bed before transfer could be arranged and that pt would potentially be responsible for cost of ambulance transport to [place].  Dtr states she wants to discuss with attending.                      Expected Discharge Plan:  Skilled Nursing Facility  In-House Referral:  Clinical Social Work  Discharge planning Services  CM Consult  Status of Service:  In process, will continue to follow  Girard Cooter, RN 11/23/2016, 3:28 PM

## 2016-11-23 NOTE — Progress Notes (Signed)
PT Cancellation Note  Patient Details Name: Deborah [name] MRN: [id number] DOB: [birth year]   Cancelled Treatment:    Reason Eval/Treat Not Completed: Patient at procedure or test/unavailable (pt receiving HD). Will check back later.   [name] 11/23/2016, 10:19 AM [name] PT [phone]

## 2016-11-23 NOTE — Progress Notes (Signed)
ANTICOAGULATION CONSULT NOTE - Follow Up Consult  Pharmacy Consult for warfarin  Indication: atrial fibrillation and bioprosthetic MVR  No Known Allergies  Patient Measurements: Height: 5\' 2"  (157.5 cm) Weight: 139 lb 1.8 oz (63.1 kg) IBW/kg (Calculated) : 50.1  Vital Signs: Temp: 97.8 F (36.6 C) (12/21 0730) Temp Source: Oral (12/21 0730) BP: 116/64 (12/21 0858) Pulse Rate: 96 (12/21 0858)  Labs:  Recent Labs  11/21/16 0544  11/21/16 0700 11/22/16 0220 11/23/16 0500 11/23/16 0630 11/23/16 0730  HGB  --   < > 8.6* 8.9*  --   --  8.5*  HCT  --   --  27.1* 29.1*  --   --  27.5*  PLT  --   --  329 359  --   --  361  LABPROT 17.6*  --   --  17.8* 16.8*  --   --   INR 1.43  --   --  1.46 1.35  --   --   CREATININE  --   --  4.21*  --   --  3.40* 3.47*  < > = values in this interval not displayed.  Estimated Creatinine Clearance: 15.2 mL/min (by C-G formula based on SCr of 3.47 mg/dL (H)).   Assessment: 60 yo F presents on 11/30 with SOB. 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 Admit INR 1.95 Warfarin re-started 12/13. INR 1.35 - minimal budge likely d/t rifampin, will increase dose further Friday if no increase in INR  PTA dose 4mg  M/W/F and 2mg  T/T/S/S   Nephro: ESRD on HD TTS via AV graft, removed L IJ TDC on 12/9. Last HD 12/16 (3.5 hrs @ BFR 400), Na 131, other lytes ok. - ferric gluconate with HD, midodrine daily, rena-vit  Heme/Onc: H&H 8.5/27.5, Plt 361  Goal of Therapy:  INR 2-3 Monitor platelets by anticoagulation protocol: Yes   Plan:  - Warfarin 7.5 mg x1 tonight - Monitor daily INR, CBC and signs/symptoms of further bleeding, watch DDI (rifampin probably greater than amio)  Levester Fresh, PharmD, BCPS, BCCCP Clinical Pharmacist Clinical phone for 11/23/2016 from 7a-3:30p: [phone] If after 3:30p, please call main pharmacy at:  [phone] 11/23/2016 9:18 AM

## 2016-11-23 NOTE — Progress Notes (Signed)
SLP Cancellation Note  Patient Details Name: Deborah [name] MRN: [id number] DOB: 07/08/1956   Cancelled treatment:       Reason Eval/Treat Not Completed: Medical issues which prohibited therapy. Patient on vent, lethargic after HD this am.   Gabriel Rainwater [place], CCC-SLP [phone]    Gabriel Rainwater Meryl 11/23/2016, 2:29 PM

## 2016-11-23 NOTE — Progress Notes (Signed)
14 Days Post-Op  Subjective: She is still intermittently on a ventilator but weaning is continuing.  Objective: Vital signs in last 24 hours: Temp:  [97.6 F (36.4 C)-98.5 F (36.9 C)] 97.6 F (36.4 C) (12/21 0300) Pulse Rate:  [96-106] 106 (12/21 0000) Resp:  [18-27] 25 (12/21 0000) BP: (98-111)/(64-70) 111/66 (12/21 0000) SpO2:  [99 %-100 %] 99 % (12/21 0345) FiO2 (%):  [28 %-30 %] 30 % (12/21 0345) Weight:  [62.8 kg (138 lb 7.2 oz)] 62.8 kg (138 lb 7.2 oz) (12/21 0500) Last BM Date: 11/21/16  Intake/Output from previous day: 12/20 0701 - 12/21 0700 In: 1200 [NG/GT:1150; IV Piggyback:50] Out: -  Intake/Output this shift: No intake/output data recorded.  The trach is still a balloon trach and the cuff is still up. There is no evidence of any infection and the trach looks clean and dry with minimal secretions  Lab Results:   Recent Labs  11/21/16 0700 11/22/16 0220  WBC 14.2* 13.8*  HGB 8.6* 8.9*  HCT 27.1* 29.1*  PLT 329 359   BMET  Recent Labs  11/21/16 0534 11/21/16 0700  NA  --  125*  K  --  4.2  CL  --  90*  CO2  --  25  GLUCOSE 409* 434*  BUN  --  43*  CREATININE  --  4.21*  CALCIUM  --  8.1*   PT/INR  Recent Labs  11/22/16 0220 11/23/16 0500  LABPROT 17.8* 16.8*  INR 1.46 1.35   ABG No results for input(s): PHART, HCO3 in the last 72 hours.  Invalid input(s): PCO2, PO2  Studies/Results: No results found.  Anti-infectives: Anti-infectives    Start     Dose/Rate Route Frequency Ordered Stop   11/13/16 1410  ceFAZolin (ANCEF) 2-4 GM/100ML-% IVPB    Comments:  Shaaron Adler   : cabinet override      11/13/16 1410 11/13/16 1432   11/11/16 1800  ceFAZolin (ANCEF) IVPB 1 g/50 mL premix     1 g 100 mL/hr over 30 Minutes Intravenous Every 24 hours 11/11/16 1425     11/10/16 1400  rifampin (RIFADIN) 60 mg/mL oral suspension 300 mg     300 mg Per Tube Every 8 hours 11/10/16 1029     11/10/16 0815  vancomycin (VANCOCIN) 500 mg in sodium  chloride 0.9 % 100 mL IVPB     500 mg 100 mL/hr over 60 Minutes Intravenous  Once 11/10/16 0802 11/10/16 1143   11/09/16 1830  rifampin (RIFADIN) 60 mg/mL oral suspension 300 mg  Status:  Discontinued     300 mg Oral Every 8 hours 11/09/16 1711 11/10/16 1029   11/09/16 1200  vancomycin (VANCOCIN) 500 mg in sodium chloride 0.9 % 100 mL IVPB  Status:  Discontinued     500 mg 100 mL/hr over 60 Minutes Intravenous Every T-Th-Sa (Hemodialysis) 11/08/16 0919 11/11/16 1424   11/08/16 1000  ceFEPIme (MAXIPIME) 1 g in dextrose 5 % 50 mL IVPB  Status:  Discontinued     1 g 100 mL/hr over 30 Minutes Intravenous Every 24 hours 11/08/16 0919 11/11/16 1424   11/08/16 0930  vancomycin (VANCOCIN) 1,250 mg in sodium chloride 0.9 % 250 mL IVPB     1,250 mg 166.7 mL/hr over 90 Minutes Intravenous  Once 11/08/16 0919 11/08/16 1415      Assessment/Plan: s/p Procedure(s): TRACHEOSTOMY (N/A) She is still on the ventilator intermittently so changing to a uncuffed tube is not practical at this point. The family was present  wanting to know when she will be able to phonate which will not be until a uncuffed Shiley can be placed. The family asked about injection or  phonosurgery for this problem.. 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 The patient has fixation of both of her cords in the paramedian position thus creating a very narrow airway.  Injection would only make the situation worse. Medialization would also not be an option for same reason  as the cords are already medialized. The trach cannot come out until the vocal cords either begin to function or a surgical procedure is done to remove a portion of the vocal cord. It would not be recommended to laser or remove any portion of the cord until more time his past for the function of the vocal cords to potentially return. She will be relegated to a trach  with either a cap or a  Passy-Muir valve to phonate once  she is off the ventilator.. I talked to 2 family members last night and happy to talk to any other family members regarding her condition and explanation for the problem and issues surrounding  it. Clearly her problem was not only the vocal cords but also her lungs given the length of time she has required the ventilator until now.   LOS: 21 days    Melissa Montane 11/23/2016

## 2016-11-23 NOTE — Procedures (Signed)
I have seen and examined this patient and agree with the plan of care .   Patient was seen tolerating dialysis at bedside  No issues   Hb 8.5      Ca 8.4   Alb 1.7  Phos 2.3   Hemodynamically stable during treatment    Goal 3 L   Deborah [name] W 11/23/2016, 9:31 AM

## 2016-11-23 NOTE — Progress Notes (Signed)
Name: Deborah [name] MRN: [id number] DOB: [birth year]    ADMISSION DATE:  11/02/2016 CONSULTATION DATE:  11/02/2016  REFERRING MD :  Dr. Regenia Skeeter EDP  CHIEF COMPLAINT:  SOB  BRIEF PATIENT DESCRIPTION: 60 year old female ESRD 10/3/0/17 with recent prolonged hospitalization secondary to papillary muscle rupture and subsequent HCAP. She was intubated requiring trach. Decannulated in the end of October. She then went to [facility] and was discharged 11/29. No presenting to [facility] ED 11/30 with compalints of SOB x1-2 weeks. She was seen by ENT they found cords were not opening from either scarring, inflammation, or nerve issue. They were hoping she would improve in the coming days, however, she did not and was taken for tracheostomy 12/7.  SIGNIFICANT EVENTS  09/12 Admit to [facility] 09/15 To [facility] 09/20 MVR with bioprosthetic valve 09/28 Permcath for HD 09/29 Extubated 09/30 Reintubated 10/05 Trach by Hyman Bible 10/09 Off vent 10/25 decannulated 11/8 to CIR 11/29 DC CIR 11/30 admit for resp distress 12/7 - redo trach Tracheostomy 6.4 cuffed [name]) 12/7 >> 12/09 L IJ Tunneled HD catheter removed . LUE graft being used 12/10 -  Patient did have some mild emesis overnight. Denies any abdominal pain or nausea at this time. Denies any chest pain or difficulty breathing at this time.  SUBJECTIVE/OVERNIGHT/INTERVAL HX Tolerating ATC daytime 12/18 but has not made much progress since then HD this am Denies pain  VITAL SIGNS: Temp:  [97.6 F (36.4 C)-98.2 F (36.8 C)] 98.2 F (36.8 C) (12/21 1243) Pulse Rate:  [95-110] 110 (12/21 1243) Resp:  [18-27] 21 (12/21 1243) BP: (107-121)/(51-70) 111/63 (12/21 1243) SpO2:  [99 %-100 %] 100 % (12/21 1243) FiO2 (%):  [28 %-30 %] 30 % (12/21 1157) Weight:  [131 lb 6.3 oz (59.6 kg)-139 lb 1.8 oz (63.1 kg)] 131 lb 6.3 oz (59.6 kg) (12/21 1136)  PHYSICAL EXAMINATION: General:  Elderly female, NAD on vent Neuro:  Alert, follows commands HEENT:  Normocephalic, Trach in  place   Cardiovascular: RRR, no MRG, NI s1/s2  Pulmonary: Unlabored on trach collar, Diminished as bases otherwise clear  Abdomen:  Soft. Nondistended. Normoactive bowel sounds. Integument:  Warm & dry. No rash on exposed skin.   PULMONARY No results for input(s): PHART, PCO2ART, PO2ART, HCO3, TCO2, O2SAT in the last 168 hours.  Invalid input(s): PCO2, PO2  CBC  Recent Labs Lab 11/21/16 0700 11/22/16 0220 11/23/16 0730  HGB 8.6* 8.9* 8.5*  HCT 27.1* 29.1* 27.5*  WBC 14.2* 13.8* 13.6*  PLT 329 359 361    COAGULATION  Recent Labs Lab 11/19/16 0415 11/20/16 0414 11/21/16 0544 11/22/16 0220 11/23/16 0500  INR 1.95 1.41 1.43 1.46 1.35    CARDIAC  No results for input(s): TROPONINI in the last 168 hours. No results for input(s): PROBNP in the last 168 hours.   CHEMISTRY  Recent Labs Lab 11/17/16 0945 11/18/16 0757 11/21/16 0534 11/21/16 0700 11/23/16 0630 11/23/16 0730  NA 133* 131*  --  125* 132* 131*  K 4.4 3.7  --  4.2 4.5 4.6  CL 95* 95*  --  90* 94* 95*  CO2 27 26  --  25 31 27   GLUCOSE 290* 318* 409* 434* 169* 197*  BUN 11 19  --  43* 35* 37*  CREATININE 3.64* 4.36*  --  4.21* 3.40* 3.47*  CALCIUM 8.2* 7.9*  --  8.1* 8.4* 8.7*  PHOS  --  2.9  --  2.6  --  2.3*   Estimated Creatinine Clearance: 13.8 mL/min (by C-G  formula based on SCr of 3.47 mg/dL (H)).   LIVER  Recent Labs Lab 11/17/16 0945  11/18/16 0757 11/19/16 0415 11/20/16 0414 11/21/16 0544 11/21/16 0700 11/22/16 0220 11/23/16 0500 11/23/16 0730  AST 18  --   --   --   --   --   --   --   --   --   ALT <5*  --   --   --   --   --   --   --   --   --   ALKPHOS 73  --   --   --   --   --   --   --   --   --   BILITOT 1.0  --   --   --   --   --   --   --   --   --   PROT 5.6*  --   --   --   --   --   --   --   --   --   ALBUMIN 1.9*  --  1.7*  --   --   --  1.7*  --   --  1.7*  INR  --   < >  --  1.95 1.41 1.43  --  1.46 1.35  --   < > = values in this interval not  displayed.   INFECTIOUS No results for input(s): LATICACIDVEN, PROCALCITON in the last 168 hours.   ENDOCRINE CBG (last 3)   Recent Labs  11/23/16 0341 11/23/16 0824 11/23/16 1243  GLUCAP 162* 166* 126*     MICROBIOLOGY: Urine Ctx 11/27:  Multiple species MRSA PCR 12/1:  Positive Blood Ctx x2 12/5:  2/2 MSSA Blood Ctx x1 12/7 >>NEG Catheter Tip Culture 12/9 >>NEG   ANTIBIOTICS: Cefepime 12/6 - 12/9 Vancomycin 12/6 - 12/9 Rifapmin 12/7 >> Cefazolin 12/9 >>  LINES/TUBES: PIV  ASSESSMENT / PLAN:   60 year old female with prolonged hospital stay, originally suffered a STEMI with papillary muscle rupture requiring surgical repair in September. The course was complicated by acute on chronic renal failure, requiring CRRT, atrial flutter with underwent cardioversion 10/2. Underwent tracheostomy 10/5 and was eventually weaned and decannulated 10/25. She was transferred to [facility] for further rehabilitation where she was slow to progress but it was eventually discharged 11/29. On 11/30 presented back to ED for dyspnea -vocal cord paralysis, requiring tracheostomy on 12/7 by Dr. [name], and a PEG tube placement on 12/11.   Per ENT there is  ' fixation of both of her cords in the paramedian position thus creating a very narrow airway'   Acute on chronic respiratory failure secondary to Vocal Cord Paralysis s/p ENT trach 12/7  HCAP H/O Tracheostomy (After prolonged intubation), second on 12/7 Plan: Continue weaning efforts with PSV/ ATC as tol - hope to progress to ATC x 24h & liberate her from vent- no clear reason for her to need vent other than deconditioning - obtain pCXR today  Keep oxygen saturation >92  Cautious with PMV eventually due to vocal cord issues  Long conversation with  Daughter, answered all her questions , also d/w Dr Sherral Hammers- daughter requesting transfer to [facility] to see if any other interventions could be done for mom. I reassured her   Other medical issues  per [facility]    Kara Mead MD. FCCP. [facility] Pager [phone] If no response call 319 [phone]   11/23/2016  11/23/2016  2:36 PM

## 2016-11-23 NOTE — Progress Notes (Addendum)
Hemodialysis completed.  Per report from HMD RN Cory Munch, RN) 3L was removed.    Daughter, [name], at bedside wanting to talk to MD.  MD notified.  Case Manager went to talk to patient's daughter.  Ms. [name] stated that she wants to talk to MD, not another RN.

## 2016-11-24 DIAGNOSIS — J9600 Acute respiratory failure, unspecified whether with hypoxia or hypercapnia: Secondary | ICD-10-CM

## 2016-11-24 DIAGNOSIS — E118 Type 2 diabetes mellitus with unspecified complications: Secondary | ICD-10-CM

## 2016-11-24 DIAGNOSIS — R7881 Bacteremia: Secondary | ICD-10-CM

## 2016-11-24 DIAGNOSIS — E1169 Type 2 diabetes mellitus with other specified complication: Secondary | ICD-10-CM

## 2016-11-24 DIAGNOSIS — E785 Hyperlipidemia, unspecified: Secondary | ICD-10-CM

## 2016-11-24 DIAGNOSIS — I2101 ST elevation (STEMI) myocardial infarction involving left main coronary artery: Secondary | ICD-10-CM

## 2016-11-24 DIAGNOSIS — A4101 Sepsis due to Methicillin susceptible Staphylococcus aureus: Secondary | ICD-10-CM

## 2016-11-24 DIAGNOSIS — E1165 Type 2 diabetes mellitus with hyperglycemia: Secondary | ICD-10-CM

## 2016-11-24 DIAGNOSIS — B9561 Methicillin susceptible Staphylococcus aureus infection as the cause of diseases classified elsewhere: Secondary | ICD-10-CM

## 2016-11-24 LAB — GLUCOSE, CAPILLARY
GLUCOSE-CAPILLARY: 117 mg/dL — AB (ref 65–99)
GLUCOSE-CAPILLARY: 157 mg/dL — AB (ref 65–99)
Glucose-Capillary: 104 mg/dL — ABNORMAL HIGH (ref 65–99)
Glucose-Capillary: 119 mg/dL — ABNORMAL HIGH (ref 65–99)
Glucose-Capillary: 123 mg/dL — ABNORMAL HIGH (ref 65–99)
Glucose-Capillary: 125 mg/dL — ABNORMAL HIGH (ref 65–99)
Glucose-Capillary: 145 mg/dL — ABNORMAL HIGH (ref 65–99)

## 2016-11-24 LAB — BASIC METABOLIC PANEL
Anion gap: 10 (ref 5–15)
BUN: 30 mg/dL — ABNORMAL HIGH (ref 6–20)
CHLORIDE: 94 mmol/L — AB (ref 101–111)
CO2: 30 mmol/L (ref 22–32)
CREATININE: 2.66 mg/dL — AB (ref 0.44–1.00)
Calcium: 8.4 mg/dL — ABNORMAL LOW (ref 8.9–10.3)
GFR calc non Af Amer: 19 mL/min — ABNORMAL LOW (ref >60)
GFR, EST AFRICAN AMERICAN: 21 mL/min — AB (ref >60)
Glucose, Bld: 176 mg/dL — ABNORMAL HIGH (ref 65–99)
POTASSIUM: 4 mmol/L (ref 3.5–5.1)
SODIUM: 134 mmol/L — AB (ref 135–145)

## 2016-11-24 LAB — MAGNESIUM: MAGNESIUM: 2.1 mg/dL (ref 1.7–2.4)

## 2016-11-24 LAB — PROTIME-INR
INR: 1.7
Prothrombin Time: 20.1 seconds — ABNORMAL HIGH (ref 11.4–15.2)

## 2016-11-24 MED ORDER — WARFARIN SODIUM 5 MG PO TABS
7.5000 mg | ORAL_TABLET | Freq: Once | ORAL | Status: AC
  Administered 2016-11-24: 7.5 mg via ORAL
  Filled 2016-11-24: qty 2

## 2016-11-24 NOTE — Progress Notes (Signed)
Placed pt back on vent at this time per pt request due to being tired

## 2016-11-24 NOTE — Progress Notes (Signed)
Name: Deborah [name] MRN: [id number] DOB: July 17, 1956    ADMISSION DATE:  11/02/2016 CONSULTATION DATE:  11/02/2016  REFERRING MD :  Dr. Regenia Skeeter EDP  CHIEF COMPLAINT:  SOB  BRIEF PATIENT DESCRIPTION: 60 year old female ESRD 10/3/0/17 with recent prolonged hospitalization secondary to papillary muscle rupture and subsequent HCAP. She was intubated requiring trach. Decannulated in the end of October. She then went to [facility] and was discharged 11/29. No presenting to [facility] ED 11/30 with compalints of SOB x1-2 weeks. She was seen by ENT they found cords were not opening from either scarring, inflammation, or nerve issue. They were hoping she would improve in the coming days, however, she did not and was taken for tracheostomy 12/7.  SIGNIFICANT EVENTS  09/12 Admit to [facility] 09/15 To [facility] 09/20 MVR with bioprosthetic valve 09/28 Permcath for HD 09/29 Extubated 09/30 Reintubated 10/05 Trach by Hyman Bible 10/09 Off vent 10/25 decannulated 11/8 to CIR 11/29 DC CIR 11/30 admit for resp distress 12/7 - redo trach Tracheostomy 6.4 cuffed [name]) 12/7 >> 12/09 L IJ Tunneled HD catheter removed . LUE graft being used 12/10 -  Patient did have some mild emesis overnight. Denies any abdominal pain or nausea at this time. Denies any chest pain or difficulty breathing at this time.  SUBJECTIVE/OVERNIGHT/INTERVAL HX Tolerating ATC today Appears comfortable  VITAL SIGNS: Temp:  [98.2 F (36.8 C)-99 F (37.2 C)] 98.4 F (36.9 C) (12/22 1232) Pulse Rate:  [90-101] 92 (12/22 1602) Resp:  [19-26] 25 (12/22 1602) BP: (96-118)/(56-85) 107/62 (12/22 1602) SpO2:  [98 %-100 %] 98 % (12/22 1602) FiO2 (%):  [28 %-30 %] 28 % (12/22 1602) Weight:  [135 lb 9.3 oz (61.5 kg)] 135 lb 9.3 oz (61.5 kg) (12/22 0432)  PHYSICAL EXAMINATION: General:  Elderly female, NAD  Neuro:  Alert, follows commands HEENT:  Normocephalic, Trach in place   Cardiovascular: RRR, no MRG, NI s1/s2  Pulmonary: Unlabored on trach collar,  Diminished as bases otherwise clear  Abdomen:  Soft. Nondistended. Normoactive bowel sounds. Integument:  Warm & dry. No rash on exposed skin.   PULMONARY No results for input(s): PHART, PCO2ART, PO2ART, HCO3, TCO2, O2SAT in the last 168 hours.  Invalid input(s): PCO2, PO2  CBC  Recent Labs Lab 11/21/16 0700 11/22/16 0220 11/23/16 0730  HGB 8.6* 8.9* 8.5*  HCT 27.1* 29.1* 27.5*  WBC 14.2* 13.8* 13.6*  PLT 329 359 361    COAGULATION  Recent Labs Lab 11/20/16 0414 11/21/16 0544 11/22/16 0220 11/23/16 0500 11/24/16 0435  INR 1.41 1.43 1.46 1.35 1.70    CARDIAC  No results for input(s): TROPONINI in the last 168 hours. No results for input(s): PROBNP in the last 168 hours.   CHEMISTRY  Recent Labs Lab 11/18/16 0757 11/21/16 0534 11/21/16 0700 11/23/16 0630 11/23/16 0730 11/24/16 0851  NA 131*  --  125* 132* 131* 134*  K 3.7  --  4.2 4.5 4.6 4.0  CL 95*  --  90* 94* 95* 94*  CO2 26  --  25 31 27 30   GLUCOSE 318* 409* 434* 169* 197* 176*  BUN 19  --  43* 35* 37* 30*  CREATININE 4.36*  --  4.21* 3.40* 3.47* 2.66*  CALCIUM 7.9*  --  8.1* 8.4* 8.7* 8.4*  MG  --   --   --   --   --  2.1  PHOS 2.9  --  2.6  --  2.3*  --    Estimated Creatinine Clearance: 19.7 mL/min (by C-G  formula based on SCr of 2.66 mg/dL (H)).   LIVER  Recent Labs Lab 11/18/16 0757  11/20/16 0414 11/21/16 0544 11/21/16 0700 11/22/16 0220 11/23/16 0500 11/23/16 0730 11/24/16 0435  ALBUMIN 1.7*  --   --   --  1.7*  --   --  1.7*  --   INR  --   < > 1.41 1.43  --  1.46 1.35  --  1.70  < > = values in this interval not displayed.   INFECTIOUS No results for input(s): LATICACIDVEN, PROCALCITON in the last 168 hours.   ENDOCRINE CBG (last 3)   Recent Labs  11/24/16 0810 11/24/16 1233 11/24/16 1548  GLUCAP 119* 157* 125*     MICROBIOLOGY: Urine Ctx 11/27:  Multiple species MRSA PCR 12/1:  Positive Blood Ctx x2 12/5:  2/2 MSSA Blood Ctx x1 12/7 >>NEG Catheter Tip  Culture 12/9 >>NEG   ANTIBIOTICS: Cefepime 12/6 - 12/9 Vancomycin 12/6 - 12/9 Rifapmin 12/7 >> Cefazolin 12/9 >>  LINES/TUBES: PIV  ASSESSMENT / PLAN:   60 year old female with prolonged hospital stay, originally suffered a STEMI with papillary muscle rupture requiring surgical repair in September. The course was complicated by acute on chronic renal failure, requiring CRRT, atrial flutter with underwent cardioversion 10/2. Underwent tracheostomy 10/5 and was eventually weaned and decannulated 10/25. She was transferred to [facility] for further rehabilitation where she was slow to progress but it was eventually discharged 11/29. On 11/30 presented back to ED for dyspnea -vocal cord paralysis, requiring tracheostomy on 12/7 by Dr. [name], and a PEG tube placement on 12/11.   Per ENT there is  ' fixation of both of her cords in the paramedian position thus creating a very narrow airway'   Acute on chronic respiratory failure secondary to Vocal Cord Paralysis s/p ENT trach 12/7  HCAP H/O Tracheostomy (After prolonged intubation), second on 12/7 Plan: Continue ATC as tol - hopefully she will not require ventilator again  Cautious with PMV  due to vocal cord issues, use under supervision only  I had a Long conversation with  Daughter rene on 12/21, answered all her questions , also d/w Dr Sherral Hammers- today her other daughter was present and very confrontational, was requesting transfer to [facility], stating that she was her POA, and telling me that we were focusing on the wrong issues like the ventilator. I explained to her that weaning her off the ventilator was the primary goal & the process of doing so. I also explained our team's role in her care so far including her ICU stay. I also note long discussions that both ENT and the primary team have had with this family Since she is liberated from the ventilator, PCCM will sign off for now We remain available for questions as needed   Other medical issues  per [facility]    Kara Mead MD. [facility]. [place] [facility] Pager [phone] If no response call [phone]    11/24/2016  5:00 PM

## 2016-11-24 NOTE — Progress Notes (Signed)
Deborah [name]   Subjective:   Interval History: appears comfortable  Trach no issues  Objective:  Vital signs in last 24 hours:  Temp:  [98.2 F (36.8 C)-99.3 F (37.4 C)] 98.4 F (36.9 C) (12/22 1232) Pulse Rate:  [90-102] 91 (12/22 1232) Resp:  [17-26] 26 (12/22 1232) BP: (96-118)/(56-85) 118/71 (12/22 1232) SpO2:  [98 %-100 %] 99 % (12/22 1232) FiO2 (%):  [28 %-30 %] 28 % (12/22 1112) Weight:  [61.5 kg (135 lb 9.3 oz)] 61.5 kg (135 lb 9.3 oz) (12/22 0432)  Weight change: 0.3 kg (10.6 oz) Filed Weights   11/23/16 0730 11/23/16 1136 11/24/16 0432  Weight: 63.1 kg (139 lb 1.8 oz) 59.6 kg (131 lb 6.3 oz) 61.5 kg (135 lb 9.3 oz)    Intake/Output: I/O last 3 completed shifts: In: 2278 [I.V.:3; NG/GT:2225; IV Piggyback:50] Out: 3000 [Other:3000]   Intake/Output this shift:  No intake/output data recorded.  CVS- RRR RS- CTA trach  ABD- BS present soft non-distended EXT- no edema   Basic Metabolic Panel:  Recent Labs Lab 11/18/16 0757 11/21/16 0534 11/21/16 0700 11/23/16 0630 11/23/16 0730 11/24/16 0851  NA 131*  --  125* 132* 131* 134*  K 3.7  --  4.2 4.5 4.6 4.0  CL 95*  --  90* 94* 95* 94*  CO2 26  --  _0 GLUCOSE 318* 409* 434* 169* 197* 176*  BUN 19  --  43* 35* 37* 30*  CREATININE 4.36*  --  4.21* 3.40* 3.47* 2.66*  CALCIUM 7.9*  --  8.1* 8.4* 8.7* 8.4*  MG  --   --   --   --   --  2.1  PHOS 2.9  --  2.6  --  2.3*  --     Liver Function Tests:  Recent Labs Lab 11/18/16 0757 11/21/16 0700 11/23/16 0730  ALBUMIN 1.7* 1.7* 1.7*   No results for input(s): LIPASE, AMYLASE in the last 168 hours. No results for input(s): AMMONIA in the last 168 hours.  CBC:  Recent Labs Lab 11/18/16 0759 11/19/16 0415 11/21/16 0700 11/22/16 0220 11/23/16 0730  WBC 16.8* 17.0* 14.2* 13.8* 13.6*  HGB 8.8* 9.2* 8.6* 8.9* 8.5*  HCT 28.1* 30.0* 27.1* 29.1* 27.5*  MCV 86.5 88.2 87.4 89.3 89.3  PLT 324 325 329 359 361     Cardiac Enzymes: No results for input(s): CKTOTAL, CKMB, CKMBINDEX, TROPONINI in the last 168 hours.  BNP: Invalid input(s): POCBNP  CBG:  Recent Labs Lab 11/23/16 1955 11/24/16 0036 11/24/16 0326 11/24/16 0810 11/24/16 1233  GLUCAP 188* 145* 117* 59* 157*    Microbiology: Results for orders placed or performed during the hospital encounter of 11/02/16  MRSA PCR Screening     Status: Abnormal   Collection Time: 11/03/16  5:42 AM  Result Value Ref Range Status   MRSA by PCR POSITIVE (A) NEGATIVE Final    Comment:        The GeneXpert MRSA Assay (FDA approved for NASAL specimens only), is one component of a comprehensive MRSA colonization surveillance program. It is not intended to diagnose MRSA infection nor to guide or monitor treatment for MRSA infections. RESULT CALLED TO, READ BACK BY AND VERIFIED WITH: M.SCHMIT RN AT [phone] 11/03/16 BY A.DAVIS   Culture, blood (Routine X 2) [name] Reflex to ID Panel     Status: Abnormal   Collection Time: 11/07/16  7:02 PM  Result Value Ref Range Status   Specimen Description BLOOD RIGHT  ANTECUBITAL  Final   Special Requests IN PEDIATRIC BOTTLE 2CC  Final   Culture  Setup Time   Final    AEROBIC BOTTLE ONLY GRAM POSITIVE COCCI Organism ID to follow CRITICAL RESULT CALLED TO, READ BACK BY AND VERIFIED WITH: JAMES LEDFORD, PHARMD _0  11/08/16 MKELLY,MLT    Culture STAPHYLOCOCCUS AUREUS (A)  Final   Report Status 11/11/2016 FINAL  Final   Organism ID, Bacteria STAPHYLOCOCCUS AUREUS  Final      Susceptibility   Staphylococcus aureus - MIC*    CIPROFLOXACIN 2 INTERMEDIATE Intermediate     ERYTHROMYCIN >=8 RESISTANT Resistant     GENTAMICIN <=0.5 SENSITIVE Sensitive     OXACILLIN 0.5 SENSITIVE Sensitive     TETRACYCLINE <=1 SENSITIVE Sensitive     VANCOMYCIN <=0.5 SENSITIVE Sensitive     TRIMETH/SULFA <=10 SENSITIVE Sensitive     CLINDAMYCIN <=0.25 RESISTANT Resistant     RIFAMPIN <=0.5 SENSITIVE Sensitive     Inducible  Clindamycin POSITIVE Resistant     * STAPHYLOCOCCUS AUREUS  Blood Culture ID Panel (Reflexed)     Status: Abnormal   Collection Time: 11/07/16  7:02 PM  Result Value Ref Range Status   Enterococcus species NOT DETECTED NOT DETECTED Final   Listeria monocytogenes NOT DETECTED NOT DETECTED Final   Staphylococcus species DETECTED (A) NOT DETECTED Final    Comment: CRITICAL RESULT CALLED TO, READ BACK BY AND VERIFIED WITH: JAMES LEDFORD, PHARMD _1  11/08/16 MKELLY,MLT    Staphylococcus aureus DETECTED (A) NOT DETECTED Final    Comment: CRITICAL RESULT CALLED TO, READ BACK BY AND VERIFIED WITH: JAMES LEDFORD, PHARMD _2  11/08/16 MKELLY,MLT    Methicillin resistance NOT DETECTED NOT DETECTED Final   Streptococcus species NOT DETECTED NOT DETECTED Final   Streptococcus agalactiae NOT DETECTED NOT DETECTED Final   Streptococcus pneumoniae NOT DETECTED NOT DETECTED Final   Streptococcus pyogenes NOT DETECTED NOT DETECTED Final   Acinetobacter baumannii NOT DETECTED NOT DETECTED Final   Enterobacteriaceae species NOT DETECTED NOT DETECTED Final   Enterobacter cloacae complex NOT DETECTED NOT DETECTED Final   Escherichia coli NOT DETECTED NOT DETECTED Final   Klebsiella oxytoca NOT DETECTED NOT DETECTED Final   Klebsiella pneumoniae NOT DETECTED NOT DETECTED Final   Proteus species NOT DETECTED NOT DETECTED Final   Serratia marcescens NOT DETECTED NOT DETECTED Final   Haemophilus influenzae NOT DETECTED NOT DETECTED Final   Neisseria meningitidis NOT DETECTED NOT DETECTED Final   Pseudomonas aeruginosa NOT DETECTED NOT DETECTED Final   Candida albicans NOT DETECTED NOT DETECTED Final   Candida glabrata NOT DETECTED NOT DETECTED Final   Candida krusei NOT DETECTED NOT DETECTED Final   Candida parapsilosis NOT DETECTED NOT DETECTED Final   Candida tropicalis NOT DETECTED NOT DETECTED Final  Culture, blood (Routine X 2) [name] Reflex to ID Panel     Status: Abnormal   Collection Time: 11/07/16   7:08 PM  Result Value Ref Range Status   Specimen Description BLOOD RIGHT HAND  Final   Special Requests BOTTLES DRAWN AEROBIC ONLY 5CC  Final   Culture  Setup Time   Final    GRAM POSITIVE COCCI IN CLUSTERS AEROBIC BOTTLE ONLY CRITICAL VALUE NOTED.  VALUE IS CONSISTENT WITH PREVIOUSLY REPORTED AND CALLED VALUE.    Culture (A)  Final    STAPHYLOCOCCUS AUREUS SUSCEPTIBILITIES PERFORMED ON PREVIOUS CULTURE WITHIN THE LAST 5 DAYS.    Report Status 11/11/2016 FINAL  Final  Culture, blood (Routine X 2) [name] Reflex to ID Panel  Status: None   Collection Time: 11/09/16  6:05 PM  Result Value Ref Range Status   Specimen Description BLOOD RIGHT HAND  Final   Special Requests BOTTLES DRAWN AEROBIC ONLY [place]  Final   Culture NO GROWTH 5 DAYS  Final   Report Status 11/14/2016 FINAL  Final  Cath Tip Culture     Status: None   Collection Time: 11/11/16  2:58 PM  Result Value Ref Range Status   Specimen Description CATH TIP  Final   Special Requests NONE  Final   Culture NO GROWTH 3 DAYS  Final   Report Status 11/15/2016 FINAL  Final    Coagulation Studies:  Recent Labs  11/22/16 0220 11/23/16 0500 11/24/16 0435  LABPROT 17.8* 16.8* 20.1*  INR 1.46 1.35 1.70    Urinalysis: No results for input(s): COLORURINE, LABSPEC, PHURINE, GLUCOSEU, HGBUR, BILIRUBINUR, KETONESUR, PROTEINUR, UROBILINOGEN, NITRITE, LEUKOCYTESUR in the last 72 hours.  Invalid input(s): APPERANCEUR    Imaging: Dg Chest Port 1 View  Result Date: 11/23/2016 CLINICAL DATA:  Weaning from the ventilator. End-stage renal disease on dialysis. History of previous MI, mitral regurgitation, atrial flutter, and diabetes. EXAM: PORTABLE CHEST 1 VIEW COMPARISON:  Portable chest x-ray of November 10, 2016 and chest CT scan of November 18, 2016. FINDINGS: There remain infiltrates in the lower lobes greatest on the left. There is no significant pleural effusion. There is no pneumothorax. The heart is top-normal in size. The  pulmonary vascularity is not engorged. The sternal wires and the mitral valve ring appear stable. The tracheostomy appliance tip projects at the level of the clavicular heads. The right internal jugular venous catheter tip projects over the midportion of the SVC. The observed bony thorax is unremarkable. IMPRESSION: Persistent bibasilar pneumonia greatest on the left. No CHF or significant pleural effusion. Electronically Signed   By: David  Martinique M.D.   On: 11/23/2016 15:47     Medications:   . feeding supplement (VITAL AF 1.2 CAL) 1,000 mL (11/23/16 2138)   . amiodarone  200 mg Per Tube Daily  . atorvastatin  20 mg Per Tube q1800  .  ceFAZolin (ANCEF) IV  1 g Intravenous Q24H  . chlorhexidine gluconate (MEDLINE KIT)  15 mL Mouth Rinse BID  . clonazePAM  0.5 mg Per Tube BID  . clopidogrel  75 mg Per Tube Daily  . darbepoetin (ARANESP) injection - DIALYSIS  100 mcg Intravenous Q Sat-HD  . docusate  100 mg Per Tube BID  . feeding supplement (PRO-STAT SUGAR FREE 64)  30 mL Oral BID  . guaiFENesin-codeine  5 mL Per Tube Q6H  . insulin aspart  0-9 Units Subcutaneous Q4H  . insulin aspart  6 Units Subcutaneous Q6H  . insulin detemir  20 Units Subcutaneous BID  . mouth rinse  15 mL Mouth Rinse QID  . metoCLOPramide  10 mg Per Tube BID  . midodrine  10 mg Per Tube q morning - 10a  . multivitamin  1 tablet Oral QHS  . pantoprazole sodium  40 mg Per Tube Daily  . rifampin  300 mg Per Tube Q8H  . sennosides  5 mL Per Tube BID  . sodium chloride flush  3 mL Intravenous Q12H  . warfarin  7.5 mg Oral ONCE-1800  . Warfarin - Pharmacist Dosing Inpatient   Does not apply q1800   sodium chloride, sodium chloride, alteplase, fentaNYL (SUBLIMAZE) injection, fluticasone, heparin, levalbuterol, lidocaine-prilocaine, ondansetron (ZOFRAN) IV, pentafluoroprop-tetrafluoroeth, prochlorperazine  Assessment/ Plan:  1. MSSA bacteremia/HD linesepsis: s/p HD  cath removal, using L AVG. Refused TEE, empiric 6  wks IV abx per ID 2. Vocal cord paralysis, s/p repeat trach12/7: Remains on vent, weaning now 3. 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 No signs of recovery. Next HD 12/23 4. Anemia: Hgb 8.9.Continue Aranesp 155mg weekly. 5. MBD: Ca/Phos/PTH ok. No meds for now. 6. BP/Volume: OP EDW 56 kg. No edema on exam, run even on HD. CXR 12/8 without pulm edema. HD today. Net UF 500 post wt 55.7. Had issues with cramping. BP stable. May need to allow EDW to drift up.  7. Nutrition: Albumin 1.7.Tube feeds ongoing, + prostat. Severe protein calorie malnutrition. 8. Hypophosphatemia: Phos 2.9. 9. Hypokalemia: 2 K bath  10. Hx MV replacement: Sept 2017 presented [name] acute MI and papillary musc rupture, acute MR > had MVR 11. Atrial fibrillation: Per primary. On amiodarone/warfarin. 12. DM: Per primary     LOS: [id number],Deborah [name] _0 _1 :35 PM

## 2016-11-24 NOTE — Progress Notes (Addendum)
PROGRESS NOTE    Deborah [name]  ZOX:[id number] DOB: 07-12-1956 DOA: 11/02/2016 PCP: Rica Mast, MD   Brief Narrative:  61-yo BF PMHx Prolonged intubation requiring tracheostomy placement and subsequent decannulation, STEMI with Papillary Muscle Rupture requiring S/P surgical repair in September, Atrial Flutter S/P cardioversion on Coumadin. HTN, DM type II uncontrolled with complication, HLD, ESRD on HD (T/TH/Sat)  Patient reports her dyspnea has persisted even in rehabilitation. Reports intermittent nonproductive cough as well as intermittent wheezing. Denies any chest pain or tightness at this time. Denies any subjective fever, chills, or sweats. Reports she was using incentive spirometry but it's unclear whether or not this was effective. Patient presented with labored breathing today during hemodialysis session. She does endorse some orthopnea as well as paroxysmal nocturnal dyspnea.    Subjective: 12/22 patient appears to be A/O 4. Nods yes and no to questions appropriately. Appears comfortable. On Trach Cuff. States has not been in chair nor has she tried PMV. NOTE: Asked patient and patient's RN if daughters had left name of Physician from [facility] (surgeon?) That has agreed to accept patient in transfer. 80 name still unavailable. ,     Assessment & Plan:   Principal Problem:   Staphylococcus aureus bacteremia Active Problems:   Mitral regurgitation   S/P MVR (mitral valve replacement)   HCAP (healthcare-associated pneumonia)   Diabetes mellitus type 2 in nonobese ([name])   ESRD on dialysis ([place])   Anemia of chronic disease   Atrial flutter ([name])   Respiratory distress   Vocal cord paralysis   Bacteremia   End-stage renal disease on hemodialysis ([name])   Paroxysmal atrial fibrillation ([name])   Heart attack   Uncontrolled type 2 diabetes mellitus with complication, without long-term current use of insulin ([place])   Advance care planning   Vocal cord  dysfunction   Palliative care by specialist   Goals of care, counseling/discussion   Palliative care encounter   Bacteremia due to Staphylococcus aureus   Staphylococcus aureus bacteremia with sepsis ([place])   Uncontrolled type 2 diabetes mellitus with complication ([place])   ST elevation myocardial infarction involving left main coronary artery ([place])  Vocal cord paralysis S/P repeat tracheostomy by ENT on 12/7 -Tracheostomy care per ENT. - Continuing ventilator wean &transition to tracheostomy collar. Patient still requiring ventilator support. -Daughter [name] unable to comprehend that mother requires ventilator support as well as HD and there are minimal facilities available to accept patient with both knees. -Daughter would like patient transferred to [facility] for second opinion. Per Dr.John [name] ENT note from 12/21:  patient has fixation of both of her cords in the paramedian position thus creating a very narrow airway.  Injection would only make the situation worse. Medialization would also not be an option for same reason  as the cords are already medialized. The trach cannot come out until the vocal cords either begin to function or a surgical procedure is done to remove a portion of the vocal cord. It would not be recommended to laser or remove any portion of the cord until more time his past for the function of the vocal cords to potentially return. -12/22 Speech attempted to use PMV, unfortunately cords fixed in such a position that enough air is allowed to pass through and pressure buildup occurs necessitating removal of PMV. Would be very judicious in the use of PMV and only under DIRECT supervision   HCAP/Bacteremia/Sepsis:  -Continue antibiotics per ID. -Repeat blood culture 12/7 negative   PEG  stoma bleeding -Bleeding resolved 11/13/16 .   ESRD on HD (T/TH/Sat) -Per nephrology  Hypokalemia -Potassium goal> 4  Hypomagnesemia -Magnesium goal>  2  Paroxysmal Atrial Fibrillation -Amiodarone -Heparin-->Warfarin Recent Labs Lab 11/20/16 0414 11/21/16 0544 11/22/16 0220 11/23/16 0500 11/24/16 0435  INR 1.41 1.43 1.46 1.35 1.70    Bioprosthetic heart valve -Currently no issues  Recent Inferior STEMI:  -S/P DESx2.  -Plavix 75 mg daily  HLD -Lipitor 20 mg daily -Lipid panel pending  Hypotension -Midodrin 10 mg daily  DM type II uncontrolled with complication -2/63 Hemoglobin A1c= 13.3 -Vital AF 1.2 CAL 50 ml/hr -Levemir 20 units BID NovoLog 6 units QID -Sensitive SSI  Anxiety  Deconditioning -Out of bed to chair q shift  Goals of care -Daughter [name] states that mother and family have agreed that not receiving best care at [facility] request transfer to [facility] in order to obtain better care. States they understand this is an elective transfer and that they will incur the cost of transportation. Also understand they need to provide name of physician who will accept patient at [facility]. -NOTE: Since no name of physician provided by Daughter [name], contacted the [facility] general patient transfer center [phone]. Spoke with Dr. Vista Lawman Attending Physician Internal Medicine and requested transfer to [facility] per family's request for second opinion. Unfortunately at this time Dr. Vista Lawman [facility] has declined to accept patient in transfer - Daughter [name], threatened to remove patient AMA. Explained to patient and daughter what AMA actually meant and that I believe this will result in patient's death at which point patient stated she did not want to leave AMA (nodded her head no when asked did she want to leave AMA)  ADDENDUM 12/22: After obtaining answer from [facility] myself and [place] met with Daughter [name], to inform her that the request for transfer had been denied. She became upset and again reiterated how [facility] and staff had been not been serving the interests of her mother in the  following manner. She stated More or less that we have been negligent by: 1. Incorrect intubation of patient causing vocal cord trauma. 2. Incorrect treatment of patient i.e. using dropped equipment on the floor, improper bandaging of wounds, improper sterilization of equipment thereby causing bacteremia. 42 had lied about the bacteremia and not informed her or her sister concerning treatment plan 3. Physician's/medical staff intentionally avoiding family and not communicating plan of care daily. 4. Physician's intentionally misleading/lying to family concerning plan of care 5. Performing unnecessary tracheostomy and placing patient unnecessarily on ventilator. 56. Daughter [name] accused Dr. Sherral Hammers of sending falsified documentation resulting in patient not being accepted to [facility]. Recommend on 12/23 Risk Management be consulted. LEFT message with Eldridge Scot [phone] and requested that she contact me in order to be brought onto case.   ADDENDUM 12/23: Spoke with Risk Management Eldridge Scot [phone] advised her of ongoing issues with family members. Risk Management now where and will follow peripherally. Do not believe in the best interest to initiate contact with the family at this time.     DVT prophylaxis: Heparin drip--warfarin Code Status: Full Family Communication: None   Disposition Plan: SNF   Consultants:  Dr. Elsworth Soho [facility] ID Dr.John [name] ENT   Procedures/Significant Events:  09/12 Admit to [facility] 09/15 To [facility] 09/20 MVR with bioprosthetic valve 09/28 Permcath for HD 09/29 Extubated 09/30 Reintubated 10/05 Trach by Hyman Bible 10/09 Off vent 10/25 decannulated 11/8 to CIR 11/29 DC CIR 11/30  admit for resp distress 127 - redo trach Tracheostomy 6.4 cuffed [name]) 12/7 >> 12/09 L IJ Tunneled HD catheter removed . LUE graft being used 12/10 -  Patient did have some mild emesis overnight. Denies any abdominal pain or nausea at this time. Denies any chest  pain or difficulty breathing at this time.   VENTILATOR SETTINGS:    Cultures Urine Ctx 11/27: Multiple species MRSA PCR 12/1: Positive Blood Ctx x2 12/5:  2/2 MSSA Blood Ctx x1 12/7 >> negative Catheter Tip Culture 12/9 >> negative    Antimicrobials: Anti-infectives    Start     Dose/Rate Route Frequency Ordered Stop   11/13/16 1410  ceFAZolin (ANCEF) 2-4 GM/100ML-% IVPB    Comments:  Shaaron Adler   : cabinet override      11/13/16 1410 11/13/16 1432   11/11/16 1800  ceFAZolin (ANCEF) IVPB 1 g/50 mL premix     1 g 100 mL/hr over 30 Minutes Intravenous Every 24 hours 11/11/16 1425     11/10/16 1400  rifampin (RIFADIN) 60 mg/mL oral suspension 300 mg     300 mg Per Tube Every 8 hours 11/10/16 1029     11/10/16 0815  vancomycin (VANCOCIN) 500 mg in sodium chloride 0.9 % 100 mL IVPB     500 mg 100 mL/hr over 60 Minutes Intravenous  Once 11/10/16 0802 11/10/16 1143   11/09/16 1830  rifampin (RIFADIN) 60 mg/mL oral suspension 300 mg  Status:  Discontinued     300 mg Oral Every 8 hours 11/09/16 1711 11/10/16 1029   11/09/16 1200  vancomycin (VANCOCIN) 500 mg in sodium chloride 0.9 % 100 mL IVPB  Status:  Discontinued     500 mg 100 mL/hr over 60 Minutes Intravenous Every T-Th-Sa (Hemodialysis) 11/08/16 0919 11/11/16 1424   11/08/16 1000  ceFEPIme (MAXIPIME) 1 g in dextrose 5 % 50 mL IVPB  Status:  Discontinued     1 g 100 mL/hr over 30 Minutes Intravenous Every 24 hours 11/08/16 0919 11/11/16 1424   11/08/16 0930  vancomycin (VANCOCIN) 1,250 mg in sodium chloride 0.9 % 250 mL IVPB     1,250 mg 166.7 mL/hr over 90 Minutes Intravenous  Once 11/08/16 0919 11/08/16 1415       Devices    LINES / TUBES:      Continuous Infusions: . feeding supplement (VITAL AF 1.2 CAL) 1,000 mL (11/23/16 2138)     Objective: Vitals:   11/24/16 0427 11/24/16 0432 11/24/16 0753 11/24/16 0812  BP:   (!) 96/56 107/64  Pulse:   90 94  Resp:   (!) 21 19  Temp:    99 F (37.2 C)    TempSrc:    Oral  SpO2: 99%  99% 99%  Weight:  61.5 kg (135 lb 9.3 oz)    Height:        Intake/Output Summary (Last 24 hours) at 11/24/16 0847 Last data filed at 11/24/16 0600  Gross per 24 hour  Intake             1103 ml  Output             3000 ml  Net            -1897 ml   Filed Weights   11/23/16 0730 11/23/16 1136 11/24/16 0432  Weight: 63.1 kg (139 lb 1.8 oz) 59.6 kg (131 lb 6.3 oz) 61.5 kg (135 lb 9.3 oz)    Examination:  General:A/O 4. Nods yes and no to  questions appropriately And mouth answers., positive acute on chronic respiratory distress (currently on trach collar) Eyes: negative scleral hemorrhage, negative anisocoria, negative icterus ENT: Negative Runny nose, negative gingival bleeding, Neck:  Negative scars, masses, torticollis, lymphadenopathy, JVD, tracheostomy present negative sign of infection. Lungs: Clear to auscultation bilaterally without wheezes or crackles Cardiovascular: Regular rate and rhythm without murmur gallop or rub normal S1 and S2 Abdomen: negative abdominal pain, nondistended, positive soft, bowel sounds, no rebound, no ascites, no appreciable mass Extremities: No significant cyanosis, clubbing, or edema bilateral lower extremities Skin: Negative rashes, lesions, ulcers Psychiatric:  Negative depression, negative anxiety, negative fatigue, negative mania  Central nervous system:  Cranial nerves II through XII intact, tongue/uvula midline, all extremities muscle strength 5/5, sensation intact throughout, negative receptive aphasia.  .     Data Reviewed: Care during the described time interval was provided by me .  I have reviewed this patient's available data, including medical history, events of note, physical examination, and all test results as part of my evaluation. I have personally reviewed and interpreted all radiology studies.  CBC:  Recent Labs Lab 11/18/16 0759 11/19/16 0415 11/21/16 0700 11/22/16 0220 11/23/16 0730   WBC 16.8* 17.0* 14.2* 13.8* 13.6*  HGB 8.8* 9.2* 8.6* 8.9* 8.5*  HCT 28.1* 30.0* 27.1* 29.1* 27.5*  MCV 86.5 88.2 87.4 89.3 89.3  PLT 324 325 329 359 338   Basic Metabolic Panel:  Recent Labs Lab 11/17/16 0945 11/18/16 0757 11/21/16 0534 11/21/16 0700 11/23/16 0630 11/23/16 0730  NA 133* 131*  --  125* 132* 131*  K 4.4 3.7  --  4.2 4.5 4.6  CL 95* 95*  --  90* 94* 95*  CO2 27 26  --  25 31 27   GLUCOSE 290* 318* 409* 434* 169* 197*  BUN 11 19  --  43* 35* 37*  CREATININE 3.64* 4.36*  --  4.21* 3.40* 3.47*  CALCIUM 8.2* 7.9*  --  8.1* 8.4* 8.7*  PHOS  --  2.9  --  2.6  --  2.3*   GFR: Estimated Creatinine Clearance: 15.1 mL/min (by C-G formula based on SCr of 3.47 mg/dL (H)). Liver Function Tests:  Recent Labs Lab 11/17/16 0945 11/18/16 0757 11/21/16 0700 11/23/16 0730  AST 18  --   --   --   ALT <5*  --   --   --   ALKPHOS 73  --   --   --   BILITOT 1.0  --   --   --   PROT 5.6*  --   --   --   ALBUMIN 1.9* 1.7* 1.7* 1.7*   No results for input(s): LIPASE, AMYLASE in the last 168 hours. No results for input(s): AMMONIA in the last 168 hours. Coagulation Profile:  Recent Labs Lab 11/20/16 0414 11/21/16 0544 11/22/16 0220 11/23/16 0500 11/24/16 0435  INR 1.41 1.43 1.46 1.35 1.70   Cardiac Enzymes: No results for input(s): CKTOTAL, CKMB, CKMBINDEX, TROPONINI in the last 168 hours. BNP (last 3 results) No results for input(s): PROBNP in the last 8760 hours. HbA1C: No results for input(s): HGBA1C in the last 72 hours. CBG:  Recent Labs Lab 11/23/16 1700 11/23/16 1955 11/24/16 0036 11/24/16 0326 11/24/16 0810  GLUCAP 228* 188* 145* 117* 119*   Lipid Profile: No results for input(s): CHOL, HDL, LDLCALC, TRIG, CHOLHDL, LDLDIRECT in the last 72 hours. Thyroid Function Tests: No results for input(s): TSH, T4TOTAL, FREET4, T3FREE, THYROIDAB in the last 72 hours. Anemia Panel: No results for  input(s): VITAMINB12, FOLATE, FERRITIN, TIBC, IRON,  RETICCTPCT in the last 72 hours. Urine analysis:    Component Value Date/Time   COLORURINE YELLOW 10/30/2016 2120   APPEARANCEUR CLOUDY (A) 10/30/2016 2120   LABSPEC 1.021 10/30/2016 2120   PHURINE 5.0 10/30/2016 2120   GLUCOSEU NEGATIVE 10/30/2016 2120   HGBUR NEGATIVE 10/30/2016 2120   BILIRUBINUR SMALL (A) 10/30/2016 2120   [place] NEGATIVE 10/30/2016 2120   PROTEINUR 30 (A) 10/30/2016 2120   NITRITE NEGATIVE 10/30/2016 2120   LEUKOCYTESUR SMALL (A) 10/30/2016 2120   Sepsis Labs: @LABRCNTIP (procalcitonin:4,lacticidven:4)  ) No results found for this or any previous visit (from the past 240 hour(s)).       Radiology Studies: Dg Chest Port 1 View  Result Date: 11/23/2016 CLINICAL DATA:  Weaning from the ventilator. End-stage renal disease on dialysis. History of previous MI, mitral regurgitation, atrial flutter, and diabetes. EXAM: PORTABLE CHEST 1 VIEW COMPARISON:  Portable chest x-ray of November 10, 2016 and chest CT scan of November 18, 2016. FINDINGS: There remain infiltrates in the lower lobes greatest on the left. There is no significant pleural effusion. There is no pneumothorax. The heart is top-normal in size. The pulmonary vascularity is not engorged. The sternal wires and the mitral valve ring appear stable. The tracheostomy appliance tip projects at the level of the clavicular heads. The right internal jugular venous catheter tip projects over the midportion of the SVC. The observed bony thorax is unremarkable. IMPRESSION: Persistent bibasilar pneumonia greatest on the left. No CHF or significant pleural effusion. Electronically Signed   By: David  Martinique M.D.   On: 11/23/2016 15:47        Scheduled Meds: . amiodarone  200 mg Per Tube Daily  . atorvastatin  20 mg Per Tube q1800  .  ceFAZolin (ANCEF) IV  1 g Intravenous Q24H  . chlorhexidine gluconate (MEDLINE KIT)  15 mL Mouth Rinse BID  . clonazePAM  0.5 mg Per Tube BID  . clopidogrel  75 mg Per Tube Daily    . darbepoetin (ARANESP) injection - DIALYSIS  100 mcg Intravenous Q Sat-HD  . docusate  100 mg Per Tube BID  . feeding supplement (PRO-STAT SUGAR FREE 64)  30 mL Oral BID  . guaiFENesin-codeine  5 mL Per Tube Q6H  . insulin aspart  0-9 Units Subcutaneous Q4H  . insulin aspart  6 Units Subcutaneous Q6H  . insulin detemir  20 Units Subcutaneous BID  . mouth rinse  15 mL Mouth Rinse QID  . metoCLOPramide  10 mg Per Tube BID  . midodrine  10 mg Per Tube q morning - 10a  . multivitamin  1 tablet Oral QHS  . pantoprazole sodium  40 mg Per Tube Daily  . rifampin  300 mg Per Tube Q8H  . sennosides  5 mL Per Tube BID  . sodium chloride flush  3 mL Intravenous Q12H  . Warfarin - Pharmacist Dosing Inpatient   Does not apply q1800   Continuous Infusions: . feeding supplement (VITAL AF 1.2 CAL) 1,000 mL (11/23/16 2138)     LOS: 22 days    Time spent: 40 minutes    Annalicia Renfrew, Geraldo Docker, MD [facility] Pager [phone]   If 7PM-7AM, please contact night-coverage [email or web address] Password [facility] 11/24/2016, 8:47 AM

## 2016-11-24 NOTE — Progress Notes (Signed)
Physical Therapy Treatment Patient Details Name: Deborah [name] MRN: [id number] DOB: [birth year] Today's Date: 11/24/2016    History of Present Illness Pt is a 60 y/o female admitted to [facility] on 11/30 secondary to dyspnea after being discharged on 11/29 from CIR. Pt was admitted to CIR from 11/8 through 11/29. PMH including but not limited to A-fib/flutter, DM, ESRD, and CAD s/p STEMI in September (08/15/16). Pt was intubated on 9/30 with trach placement on 10/5. She progressed to weaning off of vent on 10/09 and decannulated on 10/25. She has since had to have a second trach placement this admission on 11/09/16.    PT Comments    Pt making good progress with mobility.   Follow Up Recommendations  SNF     Equipment Recommendations  None recommended by PT    Recommendations for Other Services       Precautions / Restrictions Precautions Precautions: Fall Precaution Comments: trach, PEG Restrictions Weight Bearing Restrictions: No    Mobility  Bed Mobility Overal bed mobility: Needs Assistance Bed Mobility: Supine to Sit     Supine to sit: Min assist     General bed mobility comments: Assist to elevate trunk into sitting  Transfers Overall transfer level: Needs assistance Equipment used: Rolling walker (2 wheeled) Transfers: Sit to/from [facility] Sit to Stand: +2 physical assistance;Min assist Stand pivot transfers: +2 physical assistance;Min assist       General transfer comment: Assist to bring hips up and for safety. When rising from bed +2 min A and when rising from recliner +1 min A and 1 person for lines/tubes. Verbal cues for hand placement  Ambulation/Gait Ambulation/Gait assistance: Min assist;+2 safety/equipment Ambulation Distance (Feet): 6 Feet Assistive device: Rolling walker (2 wheeled) Gait Pattern/deviations: Step-through pattern;Decreased step length - right;Decreased step length - left;Shuffle;Trunk flexed Gait velocity: decr Gait  velocity interpretation: Below normal speed for age/gender General Gait Details: Assist for support and balance. Close follow with chair. Verbal cues to stay closer to walker. VSS   Stairs            Wheelchair Mobility    Modified Rankin (Stroke Patients Only)       Balance Overall balance assessment: Needs assistance Sitting-balance support: Feet supported;No upper extremity supported Sitting balance-Leahy Scale: Fair     Standing balance support: During functional activity;Bilateral upper extremity supported Standing balance-Leahy Scale: Poor Standing balance comment: walker and min A for static standing                    Cognition Arousal/Alertness: Awake/alert Behavior During Therapy: Flat affect Overall Cognitive Status: Difficult to assess                 General Comments: pt very flat with little eye contact    Exercises      General Comments        Pertinent Vitals/Pain Pain Assessment: No/denies pain    Home Living                      Prior Function            PT Goals (current goals can now be found in the care plan section) Progress towards PT goals: Progressing toward goals    Frequency    Min 2X/week      PT Plan Current plan remains appropriate;Frequency needs to be updated    Co-evaluation             End  of Session Equipment Utilized During Treatment: Gait belt;Oxygen Activity Tolerance: Patient tolerated treatment well Patient left: in chair;with call bell/phone within reach     Time: 1146-1205 PT Time Calculation (min) (ACUTE ONLY): 19 min  Charges:  $Gait Training: 8-22 mins                    G CodesShary Decamp [facility] 12-12-16, 12:55 PM [employer] PT [phone]

## 2016-11-24 NOTE — Progress Notes (Signed)
Speech Language Pathology Treatment: [name] Speaking valve  Patient Details Name: Deborah [name] MRN: [id number] DOB: 06-22-1956 Today's Date: 11/24/2016 Time: 1145-1200 SLP Time Calculation (min) (ACUTE ONLY): 15 min  Assessment / Plan / Recommendation Clinical Impression  Continued f/u for communication, PMV.  Limited changes in ability to use PMV -  this is due to bilateral vocal fold fixation, with reduced ability to abduct her folds, causing narrowing of airway.  With valve placed for very short intervals (1-2 respiratory cycles), pt is able to produce adequate quality phonation to say 1-4 words, but then valve must be immediately removed due to air trapping/poor passage of airflow through larynx.  Pt cannot tolerate valve for greater duration than this, and downsize or change to cuffless will not result in improvements given that deficit is at level of larynx.  Pt is writing/using phone to communicate, but incentive to communicate appears to have declined.  Pt did ask "when will this get better?" - acknowledged her frustration at pace of recovery and offered support.  Will continue to follow. D/W Dr. Sherral Hammers.    HPI HPI: Pt is a 60 y/o female recently discharged 11/29 from [facility] and presented to [facility] on 11/30 secondary to dyspnea. PMH including but not limited to A-fib/flutter, DM, ESRD, and CAD s/p STEMI in September (08/15/16). Pt was intubated on 9/30 with trach placement on 10/5. She progressed to weaning off of vent on 10/09 and decannulated on 10/25. She has since had to have a second trach placement this admission on 11/09/16. ENT has been following - pt has thickened vocal folds bilaterally, with fixation of both of her cords in the paramedian position thus creating a very narrow airway.  Pt has had difficulty tolerating PMV due to vocal fold deficit.  Downsizing of trach or changing to cuffless will have little impact on toleration of PMV.      SLP Plan  Continue with current plan of care      Recommendations         Patient may use Passy-Muir Speech Valve: with SLP only PMSV Supervision: Full         Plan: Continue with current plan of care       GO               Farryn Linares L. Tivis Ringer, [place] CCC/SLP Pager [phone]  [name] 11/24/2016, 12:18 PM

## 2016-11-24 NOTE — Progress Notes (Signed)
ANTICOAGULATION CONSULT NOTE - Follow Up Consult  Pharmacy Consult for warfarin  Indication: atrial fibrillation and bioprosthetic MVR  No Known Allergies  Patient Measurements: Height: 5\' 2"  (157.5 cm) Weight: 135 lb 9.3 oz (61.5 kg) IBW/kg (Calculated) : 50.1  Vital Signs: Temp: 99 F (37.2 C) (12/22 0812) Temp Source: Oral (12/22 0812) BP: 107/64 (12/22 0812) Pulse Rate: 94 (12/22 0812)  Labs:  Recent Labs  11/22/16 0220 11/23/16 0500 11/23/16 0630 11/23/16 0730 11/24/16 0435  HGB 8.9*  --   --  8.5*  --   HCT 29.1*  --   --  27.5*  --   PLT 359  --   --  361  --   LABPROT 17.8* 16.8*  --   --  20.1*  INR 1.46 1.35  --   --  1.70  CREATININE  --   --  3.40* 3.47*  --     Estimated Creatinine Clearance: 15.1 mL/min (by C-G formula based on SCr of 3.47 mg/dL (H)).   Assessment: 60 yo F presents on 11/30 with SOB. 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 Admit INR 1.95 Warfarin re-started 12/13. 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 Last HD 12/16 (3.5 hrs @ BFR 400), Na 131, other lytes ok. - ferric gluconate with HD, midodrine daily, rena-vit  Heme/Onc: H&H 8.5/27.5, Plt 361 (12/21)  Goal of Therapy:  INR 2-3 Monitor platelets by anticoagulation protocol: Yes   Plan:  - Warfarin 7.5 mg x1 tonight - Monitor daily INR, CBC and signs/symptoms of further bleeding, watch DDI (rifampin probably greater than amio)  Levester Fresh, PharmD, BCPS, BCCCP Clinical Pharmacist Clinical phone for 11/24/2016 from 7a-3:30p: [phone] If after 3:30p, please call main pharmacy at: [phone] 11/24/2016 9:35 AM

## 2016-11-25 DIAGNOSIS — I483 Typical atrial flutter: Secondary | ICD-10-CM

## 2016-11-25 LAB — MAGNESIUM: MAGNESIUM: 2.2 mg/dL (ref 1.7–2.4)

## 2016-11-25 LAB — CBC
HEMATOCRIT: 27.7 % — AB (ref 36.0–46.0)
HEMOGLOBIN: 8.5 g/dL — AB (ref 12.0–15.0)
MCH: 27.4 pg (ref 26.0–34.0)
MCHC: 30.7 g/dL (ref 30.0–36.0)
MCV: 89.4 fL (ref 78.0–100.0)
Platelets: 413 10*3/uL — ABNORMAL HIGH (ref 150–400)
RBC: 3.1 MIL/uL — ABNORMAL LOW (ref 3.87–5.11)
RDW: 20.1 % — ABNORMAL HIGH (ref 11.5–15.5)
WBC: 8.3 10*3/uL (ref 4.0–10.5)

## 2016-11-25 LAB — BASIC METABOLIC PANEL
Anion gap: 11 (ref 5–15)
BUN: 45 mg/dL — AB (ref 6–20)
CHLORIDE: 91 mmol/L — AB (ref 101–111)
CO2: 28 mmol/L (ref 22–32)
CREATININE: 3.54 mg/dL — AB (ref 0.44–1.00)
Calcium: 8.3 mg/dL — ABNORMAL LOW (ref 8.9–10.3)
GFR calc Af Amer: 15 mL/min — ABNORMAL LOW (ref >60)
GFR calc non Af Amer: 13 mL/min — ABNORMAL LOW (ref >60)
GLUCOSE: 134 mg/dL — AB (ref 65–99)
Potassium: 3.8 mmol/L (ref 3.5–5.1)
SODIUM: 130 mmol/L — AB (ref 135–145)

## 2016-11-25 LAB — GLUCOSE, CAPILLARY
GLUCOSE-CAPILLARY: 115 mg/dL — AB (ref 65–99)
GLUCOSE-CAPILLARY: 113 mg/dL — AB (ref 65–99)
GLUCOSE-CAPILLARY: 106 mg/dL — AB (ref 65–99)
GLUCOSE-CAPILLARY: 159 mg/dL — AB (ref 65–99)
Glucose-Capillary: 141 mg/dL — ABNORMAL HIGH (ref 65–99)
Glucose-Capillary: 147 mg/dL — ABNORMAL HIGH (ref 65–99)

## 2016-11-25 LAB — LIPID PANEL
Cholesterol: 130 mg/dL (ref 0–200)
HDL: 39 mg/dL — ABNORMAL LOW (ref >40)
LDL CALC: 74 mg/dL (ref 0–99)
TRIGLYCERIDES: 86 mg/dL (ref <150)
Total CHOL/HDL Ratio: 3.3 RATIO
VLDL: 17 mg/dL (ref 0–40)

## 2016-11-25 LAB — RENAL FUNCTION PANEL
ALBUMIN: 1.7 g/dL — AB (ref 3.5–5.0)
ANION GAP: 11 (ref 5–15)
BUN: 43 mg/dL — ABNORMAL HIGH (ref 6–20)
CO2: 28 mmol/L (ref 22–32)
Calcium: 8.3 mg/dL — ABNORMAL LOW (ref 8.9–10.3)
Chloride: 91 mmol/L — ABNORMAL LOW (ref 101–111)
Creatinine, Ser: 3.21 mg/dL — ABNORMAL HIGH (ref 0.44–1.00)
GFR calc non Af Amer: 15 mL/min — ABNORMAL LOW (ref >60)
GFR, EST AFRICAN AMERICAN: 17 mL/min — AB (ref >60)
GLUCOSE: 132 mg/dL — AB (ref 65–99)
PHOSPHORUS: 3.5 mg/dL (ref 2.5–4.6)
POTASSIUM: 4 mmol/L (ref 3.5–5.1)
SODIUM: 130 mmol/L — AB (ref 135–145)

## 2016-11-25 LAB — PROTIME-INR
INR: 1.64
Prothrombin Time: 19.6 seconds — ABNORMAL HIGH (ref 11.4–15.2)

## 2016-11-25 MED ORDER — LIDOCAINE HCL (PF) 1 % IJ SOLN: 5.0000 mL | INTRAMUSCULAR | Status: DC | PRN

## 2016-11-25 MED ORDER — DARBEPOETIN ALFA 100 MCG/0.5ML IJ SOSY: 100.0000 ug | PREFILLED_SYRINGE | INTRAMUSCULAR | Status: DC

## 2016-11-25 MED ORDER — SODIUM CHLORIDE 0.9 % IV SOLN: 100.0000 mL | INTRAVENOUS | Status: DC | PRN

## 2016-11-25 MED ORDER — ALTEPLASE 2 MG IJ SOLR: 2.0000 mg | Freq: Once | INTRAMUSCULAR | Status: DC | PRN

## 2016-11-25 MED ORDER — CEFAZOLIN SODIUM-DEXTROSE 2-4 GM/100ML-% IV SOLN
2.0000 g | INTRAVENOUS | Status: DC
  Administered 2016-11-25: 2 g via INTRAVENOUS
  Filled 2016-11-25: qty 100

## 2016-11-25 MED ORDER — WARFARIN SODIUM 5 MG PO TABS
7.5000 mg | ORAL_TABLET | Freq: Once | ORAL | Status: AC
  Administered 2016-11-25: 7.5 mg via ORAL
  Filled 2016-11-25: qty 2

## 2016-11-25 MED ORDER — LIDOCAINE-PRILOCAINE 2.5-2.5 % EX CREA: 1.0000 "application " | TOPICAL_CREAM | CUTANEOUS | Status: DC | PRN

## 2016-11-25 MED ORDER — HEPARIN SODIUM (PORCINE) 1000 UNIT/ML DIALYSIS
1000.0000 [IU] | INTRAMUSCULAR | Status: DC | PRN
  Filled 2016-11-25: qty 1

## 2016-11-25 MED ORDER — HEPARIN SODIUM (PORCINE) 1000 UNIT/ML DIALYSIS: 1000.0000 [IU] | INTRAMUSCULAR | Status: DC | PRN

## 2016-11-25 MED ORDER — PENTAFLUOROPROP-TETRAFLUOROETH EX AERO: 1.0000 "application " | INHALATION_SPRAY | CUTANEOUS | Status: DC | PRN

## 2016-11-25 MED ORDER — CEFAZOLIN SODIUM-DEXTROSE 2-4 GM/100ML-% IV SOLN: 2.0000 g | INTRAVENOUS | Status: AC

## 2016-11-25 MED ORDER — LIDOCAINE-PRILOCAINE 2.5-2.5 % EX CREA
1.0000 "application " | TOPICAL_CREAM | CUTANEOUS | Status: DC | PRN
  Filled 2016-11-25: qty 5

## 2016-11-25 MED ORDER — RIFAMPIN ORAL SUSPENSION 25 MG/ML
300.0000 mg | Freq: Three times a day (TID) | ORAL | Status: AC
Start: 2016-11-25 — End: 2016-12-26

## 2016-11-25 NOTE — Plan of Care (Signed)
Problem: Education: Goal: Knowledge of [facility] Education information/materials will improve Outcome: Progressing Discussed with patient and family about bed process for transfer to another hospital with teach back displayed

## 2016-11-25 NOTE — Procedures (Signed)
I have seen and examined this patient and agree with the plan of care   Patient undergoing dialysis  BP controlled  Hb 8.5     Ca 8.4    K 4      Removal 2-3 L   .  Deborah [name] W 11/25/2016, 10:51 AM

## 2016-11-25 NOTE — Discharge Summary (Addendum)
Physician Discharge Summary  Deborah [name] DGU:[id number] DOB: 10/27/1956 DOA: 11/02/2016  PCP: Rica Mast, MD  Admit date: 11/02/2016 Discharge date: 11/25/2016  Admitted From: [facility] Disposition: Transfer to [facility]  Recommendations for Outpatient Follow-up:  1. Please obtain BMP/CBC in one week 2. Continue rifampin and Ancef for total of 6 weeks, finish date is 12/26/2016  Home Health: NA Equipment/Devices:NA  Discharge Condition: Stable CODE STATUS: Full Code Diet recommendation: Diet NPO time specified  Brief/Interim Summary: 60 year old female with significant recent medical history with prolonged intubation requiring tracheostomy placement and subsequent decannulation. Patient suffered a STEMI with papillary muscle rupture requiring surgical repair in September. Also known underlying atrial flutter status post cardioversion. Patient reports her dyspnea has persisted even in rehabilitation. Reports intermittent nonproductive cough as well as intermittent wheezing. Denies any chest pain or tightness at this time. Denies any subjective fever, chills, or sweats. Reports she was using incentive spirometry but it's unclear whether or not this was effective. Patient presented with labored breathing today during hemodialysis session. She does endorse some orthopnea as well as paroxysmal nocturnal dyspnea.  Discharge Diagnoses:  Principal Problem:   Staphylococcus aureus bacteremia Active Problems:   Mitral regurgitation   S/P MVR (mitral valve replacement)   HCAP (healthcare-associated pneumonia)   Diabetes mellitus type 2 in nonobese ([name])   ESRD on dialysis ([name])   Anemia of chronic disease   Atrial flutter ([name])   Respiratory distress   Vocal cord paralysis   Bacteremia   End-stage renal disease on hemodialysis ([name])   Paroxysmal atrial fibrillation ([name])   Heart attack   Uncontrolled type 2 diabetes mellitus with complication, without  long-term current use of insulin ([place])   Advance care planning   Vocal cord dysfunction   Palliative care by specialist   Goals of care, counseling/discussion   Palliative care encounter   Bacteremia due to Staphylococcus aureus   Staphylococcus aureus bacteremia with sepsis ([place])   Uncontrolled type 2 diabetes mellitus with complication ([place])   ST elevation myocardial infarction involving left main coronary artery ([place])   Chronic respiratory failure with hypoxia -Patient currently on trach collar daytime, ventilator at nighttime and during dialysis. -Noted local cords paralyzed in abducted position which compromised airway patency. -Continue weaning process, to follow with pulmonology at [facility]  Vocal cord paralysis S/P repeat tracheostomy by ENT 12/7 -Initial tracheostomy placed on 10/5 for prolonged intubation, was able to decannulate on 10/25. -Patient discharged to [place] [facility] on 10/11/2016. -Patient admitted to the ICU on 11/30 is 2017 for respiratory distress -Repeat tracheostomy redone on 12/7 by Dr. [name] of ENT for vocal cord paralysis.  MSSA Bacteremia  -Blood culture positive on 12/6, presumed secondary to HD catheter. Left IJ tunneled catheter removed on 12/10. -Patient does have bioprosthetic mitral valve, she declined TEE. -ID recommended antibiotics for total of 6 weeks, she is on Ancef and rifampin, finish date is 12/26/2016  HCAP -This is treated with antibiotics, no symptoms or signs suggesting pneumonia now.  ESRD on HD (T/TH/Sat) -Patient presented to the hospital on 9/12 with creatinine of 1.6 and blood glucose of 583. -Probably had CKD stage III prior to admission secondary to uncontrolled diabetes mellitus. Patient currently on hemodialysis. -Received contrast with cardiac catheterization, had hypotension because of acute CHF secondary to chordae tendineae rupture and sepsis which probably worsened her CKD. -She currently gets dialysis from  left upper extremity graft placed by Dr. Donzetta Matters on 11/02/2016  Hypokalemia -Corrected  Hypomagnesemia -Repleted  Paroxysmal Atrial  Fibrillation -Rate is controlled with amiodarone. -CHA2DS2/VAS is 5 calculated 4 HTN, CHF, DM 2, female gender and PVD. -She is on Coumadin, INR on discharge is 1.6 continue Coumadin for target INR of 2.0-3.0.  Bioprosthetic Mitral valve -Patient undergone single-vessel CABG and mitral valve replacement with bioprosthetic valve done on 08/23/2016. -She had acute congestive heart failure secondary to ruptured chordae tendineae after her AMI. -Has concurrent MSSA bacteremia, patient refused TEE. -ID recommended antibiotics for total of 6 weeks.  Acute on Chronic Hypotension -This is improved with midodrine, continue.  Inferior STEMI S/P DES x 05 August 2016, CAD -Received single-vessel CABG to OM1 on 08/23/2016 and PCI was DS 2 on 08/18/2016 -Continue  DM2, uncontrolled -Uncontrolled diabetes results type II, hemoglobin A1c was 13.3 on 9/12 this is correlate with mean plasma glucose of 335. -Currently on Levemir insulin 20 units twice a day and 6 units of NovoLog every 6 hours. -She is receiving tube feeding through PEG tube.  Dysphagia -Seen by SLP, continues to recommend nothing by mouth. -Patient has PEG tube placed on 12/11, getting to feeding   Procedures/Significant Events:  09/12 Admit to [facility], chest pain 09/15 To [facility], STEMI, placement of stent in the OM1 09/20 MVR with bioprosthetic valve and single-vessel CABG 09/28 Permcath for HD 09/29 Extubated 09/30 Reintubated 10/05 Trach by Dr. Nelda Marseille 10/09 Off vent 10/25 decannulated 11/8 to CIR 11/29 DC check 2 [facility] 11/30 readmitted to the ICU for respiratory distress 12/6 blood cultures shows MSSA. 12/7 - redo trach Tracheostomy 6.4 cuffed [name]) 12/7 >> 12/09 L IJ Tunneled HD catheter removed, LUE graft being used. 12/10 - Patient did have some mild  emesis overnight. Denies any abdominal pain or nausea at this time.  12/11 placement of PEG tube  Discharge Instructions  Discharge Instructions    Increase activity slowly    Complete by:  As directed      Allergies as of 11/25/2016   No Known Allergies     Medication List    STOP taking these medications   cefdinir 125 MG/5ML suspension Commonly known as:  OMNICEF     TAKE these medications   albuterol (2.5 MG/3ML) 0.083% nebulizer solution Commonly known as:  PROVENTIL Take 3 mLs (2.5 mg total) by nebulization 4 (four) times daily - after meals and at bedtime.   ALPRAZolam 0.25 MG tablet Commonly known as:  XANAX Take 1 tablet (0.25 mg total) by mouth 2 (two) times daily as needed for anxiety.   amiodarone 200 MG tablet Commonly known as:  PACERONE Take 1 tablet (200 mg total) by mouth daily.   atorvastatin 20 MG tablet Commonly known as:  LIPITOR Take 1 tablet (20 mg total) by mouth daily.   ceFAZolin 2-4 GM/100ML-% IVPB Commonly known as:  ANCEF Inject 100 mLs (2 g total) into the vein every Tuesday, Thursday, and Saturday at 6 PM.   clopidogrel 75 MG tablet Commonly known as:  PLAVIX Take 1 tablet (75 mg total) by mouth daily.   Darbepoetin Alfa 100 MCG/0.5ML Sosy injection Commonly known as:  ARANESP Inject 0.5 mLs (100 mcg total) into the vein every Saturday with hemodialysis. Start taking on:  12/02/2016   guaiFENesin 100 MG/5ML Soln Commonly known as:  ROBITUSSIN Take 30 mLs (600 mg total) by mouth every 6 (six) hours.   hydrocortisone 2.5 % rectal cream Commonly known as:  ANUSOL-HC Place rectally 2 (two) times daily as needed for hemorrhoids or itching.   Insulin Detemir 100 UNIT/ML Pen Commonly  known as:  LEVEMIR Inject 8 Units into the skin daily at 10 pm.   lanthanum 1000 MG chewable tablet Commonly known as:  FOSRENOL Chew 1 tablet (1,000 mg total) by mouth 3 (three) times daily with meals.   metoCLOPramide 5 MG tablet Commonly known  as:  REGLAN Take 1 tablet (5 mg total) by mouth 2 (two) times daily with a meal.   midodrine 10 MG tablet Commonly known as:  PROAMATINE Take 1 tablet (10 mg total) by mouth every morning.   multivitamin Tabs tablet Take 1 tablet by mouth at bedtime.   pantoprazole 40 MG tablet Commonly known as:  PROTONIX Take 1 tablet (40 mg total) by mouth 2 (two) times daily.   rifampin 60 mg/mL Susp oral suspension Commonly known as:  RIFADIN Place 5 mLs (300 mg total) into feeding tube every 8 (eight) hours.   warfarin 2 MG tablet Commonly known as:  COUMADIN Take 2 mg on Tue, Thu, Sat, Sun   warfarin 4 MG tablet Commonly known as:  COUMADIN Take 4 mg on Mon, Wed, Fri with supper       No Known Allergies  Consultations: Treatment Team:  Roney Jaffe, MD Edrick Oh, MD   Procedures (Echo, Carotid, EGD, Colonoscopy, ERCP)   Radiological studies: Dg Chest 2 View  Result Date: 11/08/2016 CLINICAL DATA:  Short of breath, hypertension, diabetes EXAM: CHEST  2 VIEW COMPARISON:  Portable chest x-ray of 11/07/2016 FINDINGS: Opacity remains at the left arm right lung base with apparent air bronchograms most consistent with left lower lobe pneumonia. Mild atelectasis at the right lung base remains. No definite pleural effusion is seen. Cardiomegaly is stable. Left central venous line is unchanged in position. Mitral valve replacement is present. IMPRESSION: 1. Persistent left lower lobe opacity most suspicious for pneumonia. 2. Stable cardiomegaly with central venous line. 3. Right basilar linear atelectasis. Electronically Signed   By: Ivar Drape M.D.   On: 11/08/2016 08:39   Dg Chest 2 View  Result Date: 11/01/2016 CLINICAL DATA:  Wheezing, shortness of breath, cough. EXAM: CHEST  2 VIEW COMPARISON:  Radiographs of October 30, 2016. FINDINGS: Stable cardiomegaly. Status post cardiac valve repair. Left internal jugular dialysis catheter is unchanged in position. No pneumothorax or  pleural effusion is noted. No acute pulmonary disease is noted. Bony thorax is unremarkable. IMPRESSION: No active cardiopulmonary disease. Electronically Signed   By: Marijo Conception, M.D.   On: 11/01/2016 07:25   Dg Chest 2 View  Result Date: 10/30/2016 CLINICAL DATA:  Cough and leukocytosis EXAM: CHEST  2 VIEW COMPARISON:  Chest radiograph October 15, 2016 and chest CT October 21, 2016 FINDINGS: There is persistent consolidation in the medial bases bilaterally, slightly more although left than on the right. No new parenchymal lung opacity evident. Heart is borderline prominent pulmonary vascularity within normal limits. No adenopathy. Port-A-Cath tip is at the cavoatrial junction. No pneumothorax. There is a mitral valve replacement. No bone lesions. IMPRESSION: Bibasilar consolidation. No new opacity. Stable cardiac silhouette. Electronically Signed   By: Lowella Grip III M.D.   On: 10/30/2016 10:46   Ct Chest Wo Contrast  Result Date: 11/18/2016 CLINICAL DATA:  Hospital acquired pneumonia.  Ventilator. EXAM: CT CHEST WITHOUT CONTRAST TECHNIQUE: Multidetector CT imaging of the chest was performed following the standard protocol without IV contrast. COMPARISON:  11/10/2016 chest x-ray.  Chest CT 10/21/2016 FINDINGS: Cardiovascular: Heart is mildly enlarged. Prior mitral valve repair. Left dialysis catheter in place with the tip in the  upper right atrium. Aorta is normal caliber. Mediastinum/Nodes: No mediastinal, hilar, or axillary adenopathy. Lungs/Pleura: Tracheostomy tube in place. Dependent opacity in the right lower lobe, atelectasis versus pneumonia. More dense consolidation in the left lower lobe concerning for pneumonia. No pleural effusions. Upper Abdomen: Imaging into the upper abdomen shows no acute findings. Layering high-density material in the gallbladder may reflect small stones. Musculoskeletal: Chest wall soft tissues are unremarkable. No acute bony abnormality or focal bone  lesion. IMPRESSION: Dense consolidation in the left lower lobe compatible with pneumonia. Right lower lobe atelectasis or infiltrate. Mild cardiomegaly. Tracheostomy tube in place. Question small layering stones in the gallbladder. Electronically Signed   By: Rolm Baptise M.D.   On: 11/18/2016 07:15   Ir Gastrostomy Tube Mod Sed  Result Date: 11/13/2016 INDICATION: 60 year old female with dysphagia. EXAM: PERC PLACEMENT GASTROSTOMY MEDICATIONS: 2.0 g Ancef; Antibiotics were administered within 1 hour of the procedure. ANESTHESIA/SEDATION: Versed 2.0 mg IV; Fentanyl 100 mcg IV Moderate Sedation Time:  10 The patient was continuously monitored during the procedure by the interventional radiology nurse under my direct supervision. CONTRAST:  10 cc - administered into the gastric lumen. FLUOROSCOPY TIME:  Fluoroscopy Time: 0 minutes 42 seconds (1 mGy). COMPLICATIONS: None PROCEDURE: Informed written consent was obtained from the patient and the patient's family after a thorough discussion of the procedural risks, benefits and alternatives. All questions were addressed. Maximal Sterile Barrier Technique was utilized including caps, mask, sterile gowns, sterile gloves, sterile drape, hand hygiene and skin antiseptic. A timeout was performed prior to the initiation of the procedure. The procedure, risks, benefits, and alternatives were explained to the patient. Questions regarding the procedure were encouraged and answered. The patient understands and consents to the procedure. The epigastrium was prepped with Betadine in a sterile fashion, and a sterile drape was applied covering the operative field. A sterile gown and sterile gloves were used for the procedure. A 5-French orogastric tube is placed under fluoroscopic guidance. Scout imaging of the abdomen confirms barium within the transverse colon. The stomach was distended with gas. Under fluoroscopic guidance, an 18 gauge needle was utilized to puncture the  anterior wall of the body of the stomach. An Amplatz wire was advanced through the needle passing a T fastener into the lumen of the stomach. The T fastener was secured for gastropexy. A 9-French sheath was inserted. A snare was advanced through the 9-French sheath. A Britta Mccreedy was advanced through the orogastric tube. It was snared then pulled out the oral cavity, pulling the snare, as well. The leading edge of the gastrostomy was attached to the snare. It was then pulled down the esophagus and out the percutaneous site. It was secured in place. Contrast was injected. No complication IMPRESSION: Status post fluoroscopic placed percutaneous gastrostomy tube, with 20 [place] pull-through. Signed, Dulcy Fanny. Earleen Newport, DO [facility] Radiology Electronically Signed   By: Corrie Mckusick D.O.   On: 11/13/2016 15:02   Ir Fluoro Guide Cv Line Left  Result Date: 11/13/2016 INDICATION: 60 year old female with bacteremia.  Renal failure. She has been referred for tunneled central catheter placement. EXAM: IMAGE GUIDED TUNNELED CENTRAL VENOUS LINE MEDICATIONS: 2.0 g Ancef; The antibiotic was administered within an appropriate time interval prior to skin puncture. ANESTHESIA/SEDATION: None FLUOROSCOPY TIME:  Fluoroscopy Time: 0 minutes 18 seconds COMPLICATIONS: None PROCEDURE: After written informed consent was obtained, patient was placed in the supine position on angiographic table. Patency of the left internal jugular vein was confirmed with ultrasound  with image documentation. Patient was prepped and draped in the usual sterile fashion including the right neck and right superior chest. Using ultrasound guidance, the skin and subcutaneous tissues overlying the right internal jugular vein were generously infiltrated with 1% lidocaine without epinephrine. Using ultrasound guidance, the right internal jugular vein was punctured with a micropuncture needle, and an 018 wire was  advanced into the right heart confirming venous access. A small stab incision was made with an 11 blade scalpel. Peel-away sheath was placed over the wire, and then the wire was removed, marking the wire for estimation of internal catheter length. The chest wall was then generously infiltrated with 1% lidocaine for local anesthesia along the tissue tract. Small stab incision was made with 11 blade scalpel, and then the catheter was back tunneled to the puncture site at the right internal jugular vein. Catheter was pulled through the tract, with the catheter amputated at 22 cm. Catheter was advanced through the peel-away sheath, and the peel-away sheath was removed. Final image was stored. The catheter was anchored to the chest wall with 2 retention sutures, and Derma bond was used to seal the right internal jugular vein incision site and at the right chest wall. Patient tolerated the procedure well and remained hemodynamically stable throughout. No complications were encountered and no significant blood loss was encountered. IMPRESSION: Status post left IJ central venous catheter with a cuffed double lumen 22 cm catheter. Catheter ready for use. Signed, Dulcy Fanny. Earleen Newport, DO [facility] Radiology Electronically Signed   By: Corrie Mckusick D.O.   On: 11/13/2016 15:01   Ir US Guide Vasc Access Left  Result Date: 11/13/2016 INDICATION: 60 year old female with bacteremia.  Renal failure. She has been referred for tunneled central catheter placement. EXAM: IMAGE GUIDED TUNNELED CENTRAL VENOUS LINE MEDICATIONS: 2.0 g Ancef; The antibiotic was administered within an appropriate time interval prior to skin puncture. ANESTHESIA/SEDATION: None FLUOROSCOPY TIME:  Fluoroscopy Time: 0 minutes 18 seconds COMPLICATIONS: None PROCEDURE: After written informed consent was obtained, patient was placed in the supine position on angiographic table. Patency of the left internal jugular  vein was confirmed with ultrasound with image documentation. Patient was prepped and draped in the usual sterile fashion including the right neck and right superior chest. Using ultrasound guidance, the skin and subcutaneous tissues overlying the right internal jugular vein were generously infiltrated with 1% lidocaine without epinephrine. Using ultrasound guidance, the right internal jugular vein was punctured with a micropuncture needle, and an 018 wire was advanced into the right heart confirming venous access. A small stab incision was made with an 11 blade scalpel. Peel-away sheath was placed over the wire, and then the wire was removed, marking the wire for estimation of internal catheter length. The chest wall was then generously infiltrated with 1% lidocaine for local anesthesia along the tissue tract. Small stab incision was made with 11 blade scalpel, and then the catheter was back tunneled to the puncture site at the right internal jugular vein. Catheter was pulled through the tract, with the catheter amputated at 22 cm. Catheter was advanced through the peel-away sheath, and the peel-away sheath was removed. Final image was stored. The catheter was anchored to the chest wall with 2 retention sutures, and Derma bond was used to seal the right internal jugular vein incision site and at the right chest wall. Patient tolerated the procedure well and remained hemodynamically stable throughout. No complications were encountered and no significant blood loss was encountered.  IMPRESSION: Status post left IJ central venous catheter with a cuffed double lumen 22 cm catheter. Catheter ready for use. Signed, Dulcy Fanny. Earleen Newport, DO [facility] Radiology Electronically Signed   By: Corrie Mckusick D.O.   On: 11/13/2016 15:01   Dg Chest Port 1 View  Result Date: 11/23/2016 CLINICAL DATA:  Weaning from the ventilator. End-stage renal disease on dialysis. History of  previous MI, mitral regurgitation, atrial flutter, and diabetes. EXAM: PORTABLE CHEST 1 VIEW COMPARISON:  Portable chest x-ray of November 10, 2016 and chest CT scan of November 18, 2016. FINDINGS: There remain infiltrates in the lower lobes greatest on the left. There is no significant pleural effusion. There is no pneumothorax. The heart is top-normal in size. The pulmonary vascularity is not engorged. The sternal wires and the mitral valve ring appear stable. The tracheostomy appliance tip projects at the level of the clavicular heads. The right internal jugular venous catheter tip projects over the midportion of the SVC. The observed bony thorax is unremarkable. IMPRESSION: Persistent bibasilar pneumonia greatest on the left. No CHF or significant pleural effusion. Electronically Signed   By: David  Martinique M.D.   On: 11/23/2016 15:47   Dg Chest Port 1 View  Result Date: 11/10/2016 CLINICAL DATA:  Vent trach pt sob assess lines and tubes EXAM: PORTABLE CHEST 1 VIEW COMPARISON:  11/09/2016 FINDINGS: Tracheostomy tube appears unchanged, approximately 6 cm above the carina. Left-sided dual lumen catheter tip overlies the level of the lower superior vena cava. Patient has had median sternotomy and valve replacement. A nasogastric tube is in place, tip off the image beyond the gastroesophageal junction. Small amount of contrast is identified in the left upper quadrant of the abdomen. The heart is enlarged. There is minimal opacity at the left lung base which is similar in appearance to previous exams. No evidence for significant pulmonary edema. IMPRESSION: 1. Lines and tubes as described. 2. Persistent left lower lobe opacity. 3. Cardiomegaly without evidence for pulmonary edema. Electronically Signed   By: Nolon Nations M.D.   On: 11/10/2016 08:03   Dg Chest Port 1 View  Result Date: 11/09/2016 CLINICAL DATA:  Tracheostomy placement EXAM: PORTABLE CHEST 1 VIEW COMPARISON:  11/08/2016 FINDINGS: Heart size  is top-normal. Retrocardiac opacity persists which may represent chronic atelectasis and/or infiltrate. No overt pulmonary edema. Patient status post mitral valvular replacement. Median sternotomy sutures are in place. Left IJ dialysis catheter tip is stable in at the cavoatrial junction. A tracheostomy tube tip is seen centered over the upper third of the trachea with tip approximately 4.8 cm above the carina. Gastric tube extends into the stomach. IMPRESSION: Satisfactory support line and tube positions. Chronic retrocardiac opacity which may reflect atelectasis and/or pneumonia. Electronically Signed   By: Ashley Royalty M.D.   On: 11/09/2016 19:11   Dg Chest Port 1 View  Result Date: 11/07/2016 CLINICAL DATA:  Dyspnea, hypertension, myocardial infarction and diabetes. EXAM: PORTABLE CHEST 1 VIEW COMPARISON:  11/04/2016 FINDINGS: There is stable mild cardiomegaly. The patient is status post mitral valvular replacement. Median sternotomy sutures are noted. Dialysis catheter from left IJ approach is noted with tip at the cavoatrial junction. No pneumothorax. Retrocardiac opacities are seen suspicious for left lower lobe pneumonia/atelectasis and probable small layering effusion. IMPRESSION: Retrocardiac opacity suspicious for pneumonia and/or atelectasis. Small layering left effusion is not entirely excluded. Otherwise no significant change. Electronically Signed   By: Ashley Royalty M.D.   On: 11/07/2016 20:54   Dg Chest  Port 1 View  Result Date: 11/04/2016 CLINICAL DATA:  Acute respiratory failure with hypoxia. EXAM: PORTABLE CHEST 1 VIEW COMPARISON:  11/02/2016. FINDINGS: Cardiomegaly. Prior median sternotomy for valvular disease. Dual lumen catheter from LEFT IJ approach lies with tips in the proximal RA. No infiltrates or edema. No effusion or pneumothorax. Mild subsegmental atelectasis may be present in the retrocardiac region. IMPRESSION: Cardiomegaly.  Dual lumen catheter in satisfactory position. Slight  increase in retrocardiac opacity could represent subsegmental atelectasis. Electronically Signed   By: Staci Righter M.D.   On: 11/04/2016 11:11   Dg Chest Portable 1 View  Result Date: 11/02/2016 CLINICAL DATA:  Shortness of breath and tachypnea EXAM: PORTABLE CHEST 1 VIEW COMPARISON:  Yesterday FINDINGS: Dialysis catheter on the left with tip at the upper right atrium. Chronic cardiomegaly, status post mitral valve replacement. There is no edema, consolidation, effusion, or pneumothorax. IMPRESSION: No evidence of acute disease. Electronically Signed   By: Monte Fantasia M.D.   On: 11/02/2016 18:29   Dg Abd Portable 1v  Result Date: 11/09/2016 CLINICAL DATA:  60 y/o  F; enteric tube placement. EXAM: PORTABLE ABDOMEN - 1 VIEW COMPARISON:  10/07/2016 abdominal radiograph. FINDINGS: Retained contrast in the colon. Enteric tube tip projects over the mid stomach. Partially visualized median sternotomy and valve replacement. Right central venous catheter tip projects over cavoatrial junction. IMPRESSION: Enteric tube tip projects over mid stomach. Electronically Signed   By: Kristine Garbe M.D.   On: 11/09/2016 19:00    Subjective:  Discharge Exam: Vitals:   11/25/16 1230 11/25/16 1300 11/25/16 1400 11/25/16 1430  BP: 122/69 125/70 130/70 126/69  Pulse: 93 94 96 96  Resp: (!) 23 (!) 22 20 20   Temp:      TempSrc:      SpO2: 98% 99% 99% 100%  Weight:      Height:       General: Pt is alert, awake, not in acute distress Cardiovascular: RRR, S1/S2 +, no rubs, no gallops Respiratory: CTA bilaterally, no wheezing, no rhonchi Abdominal: Soft, NT, ND, bowel sounds + Extremities: no edema, no cyanosis   The results of significant diagnostics from this hospitalization (including imaging, microbiology, ancillary and laboratory) are listed below for reference.    Microbiology: No results found for this or any previous visit (from the past 240 hour(s)).   Labs: BNP (last 3  results)  Recent Labs  08/15/16 1906  BNP 4,008.6*   Basic Metabolic Panel:  Recent Labs Lab 11/21/16 0700 11/23/16 0630 11/23/16 0730 11/24/16 0851 11/25/16 0800 11/25/16 1030  NA 125* 132* 131* 134* 130* 130*  K 4.2 4.5 4.6 4.0 3.8 4.0  CL 90* 94* 95* 94* 91* 91*  CO2 25 31 27 30 28 28   GLUCOSE 434* 169* 197* 176* 134* 132*  BUN 43* 35* 37* 30* 45* 43*  CREATININE 4.21* 3.40* 3.47* 2.66* 3.54* 3.21*  CALCIUM 8.1* 8.4* 8.7* 8.4* 8.3* 8.3*  MG  --   --   --  2.1 2.2  --   PHOS 2.6  --  2.3*  --   --  3.5   Liver Function Tests:  Recent Labs Lab 11/21/16 0700 11/23/16 0730 11/25/16 1030  ALBUMIN 1.7* 1.7* 1.7*   No results for input(s): LIPASE, AMYLASE in the last 168 hours. No results for input(s): AMMONIA in the last 168 hours. CBC:  Recent Labs Lab 11/19/16 0415 11/21/16 0700 11/22/16 0220 11/23/16 0730 11/25/16 0202  WBC 17.0* 14.2* 13.8* 13.6* 8.3  HGB 9.2* 8.6*  8.9* 8.5* 8.5*  HCT 30.0* 27.1* 29.1* 27.5* 27.7*  MCV 88.2 87.4 89.3 89.3 89.4  PLT 325 329 359 361 413*   Cardiac Enzymes: No results for input(s): CKTOTAL, CKMB, CKMBINDEX, TROPONINI in the last 168 hours. BNP: Invalid input(s): POCBNP CBG:  Recent Labs Lab 11/24/16 2059 11/24/16 2341 11/25/16 0339 11/25/16 0917 11/25/16 1233  GLUCAP 123* 104* 141* 147* 115*   D-Dimer No results for input(s): DDIMER in the last 72 hours. Hgb A1c No results for input(s): HGBA1C in the last 72 hours. Lipid Profile  Recent Labs  11/25/16 0202  CHOL 130  HDL 39*  LDLCALC 74  TRIG 86  CHOLHDL 3.3   Thyroid function studies No results for input(s): TSH, T4TOTAL, T3FREE, THYROIDAB in the last 72 hours.  Invalid input(s): FREET3 Anemia work up No results for input(s): VITAMINB12, FOLATE, FERRITIN, TIBC, IRON, RETICCTPCT in the last 72 hours. Urinalysis    Component Value Date/Time   COLORURINE YELLOW 10/30/2016 2120   APPEARANCEUR CLOUDY (A) 10/30/2016 2120   LABSPEC 1.021  10/30/2016 2120   PHURINE 5.0 10/30/2016 2120   GLUCOSEU NEGATIVE 10/30/2016 2120   HGBUR NEGATIVE 10/30/2016 2120   BILIRUBINUR SMALL (A) 10/30/2016 2120   [place] NEGATIVE 10/30/2016 2120   PROTEINUR 30 (A) 10/30/2016 2120   NITRITE NEGATIVE 10/30/2016 2120   LEUKOCYTESUR SMALL (A) 10/30/2016 2120   Sepsis Labs Invalid input(s): PROCALCITONIN,  WBC,  LACTICIDVEN Microbiology No results found for this or any previous visit (from the past 240 hour(s)).   Time coordinating discharge: Over 30 minutes  SIGNED:   Birdie Hopes, MD  [facility] 11/25/2016, 2:51 PM Pager   If 7PM-7AM, please contact night-coverage [email or web address] Password [email or web address]

## 2016-11-25 NOTE — Progress Notes (Addendum)
Pharmacy Antibiotic Note Deborah [name] is a 60 y.o. female admitted on 11/02/2016 with HCAP and MSSA bacteremia in setting of recent valve replacement. Patient has CKD on HD   Plan: Cefazolin 2g IV qTTSat @ 1800 Rifampin 300mg  PO q8h (no dose adjustment required for HD) 6 week duration per ID, pt refused TEE Pharmacy to sign off- please reconsult for antibiotic needs   Height: 5\' 2"  (157.5 cm) Weight: 136 lb 14.5 oz (62.1 kg) IBW/kg (Calculated) : 50.1  Temp (24hrs), Avg:98.4 F (36.9 C), Min:97.8 F (36.6 C), Max:98.9 F (37.2 C)   Recent Labs Lab 11/19/16 0415 11/21/16 0700 11/22/16 0220 11/23/16 0630 11/23/16 0730 11/24/16 0851 11/25/16 0202 11/25/16 1030  WBC 17.0* 14.2* 13.8*  --  13.6*  --  8.3  --   CREATININE  --  4.21*  --  3.40* 3.47* 2.66*  --  3.21*    Estimated Creatinine Clearance: 16.4 mL/min (by C-G formula based on SCr of 3.21 mg/dL (H)).    No Known Allergies  Antimicrobials: Ancef 12/9 >>  Rifampin  12/7 >> Vanco 12/6 >>12/9 Cefepime 12/6 >> 12/9  Microbiology results:  11/27 UCx: mult species 12/1 MRSA PCR: positive 12/5 BCx: BCID 2/2 MSSA 12/7 BCx: neg 12/9: cath tip: neg  Deborah Kerman D. Deborah [name], PharmD, BCPS Clinical Pharmacist Pager: [phone] 11/25/2016 12:01 PM

## 2016-11-25 NOTE — Clinical Social Work Note (Signed)
Patient transferring to [facility], per discharge summary.   CSW signing off.  Deborah [name], [place]

## 2016-11-25 NOTE — Progress Notes (Signed)
ANTICOAGULATION CONSULT NOTE - Follow Up Consult  Pharmacy Consult for warfarin  Indication: atrial fibrillation and bioprosthetic MVR  No Known Allergies  Patient Measurements: Height: 5\' 2"  (157.5 cm) Weight: 136 lb 14.5 oz (62.1 kg) IBW/kg (Calculated) : 50.1  Vital Signs: Temp: 98.2 F (36.8 C) (12/23 1100) Temp Source: Oral (12/23 1100) BP: 110/69 (12/23 1200) Pulse Rate: 94 (12/23 1200)  Labs:  Recent Labs  11/23/16 0500  11/23/16 0730 11/24/16 0435 11/24/16 0851 11/25/16 0202 11/25/16 1030  HGB  --   --  8.5*  --   --  8.5*  --   HCT  --   --  27.5*  --   --  27.7*  --   PLT  --   --  361  --   --  413*  --   LABPROT 16.8*  --   --  20.1*  --  19.6*  --   INR 1.35  --   --  1.70  --  1.64  --   CREATININE  --   < > 3.47*  --  2.66*  --  3.21*  < > = values in this interval not displayed.  Estimated Creatinine Clearance: 16.4 mL/min (by C-G formula based on SCr of 3.21 mg/dL (H)).   Assessment: 59 yo F presents on 11/30 with SOB. On Coumadin PTA for Afib, also has a recent bioprosthetic MVR  Continues on warfarin for afib and hx of bioprosthetic MVR completed in 08/2016. Admit INR 1.95. Warfarin re-started 12/13. Current INR 1.64  PTA dose 4mg  M/W/F and 2mg  T/T/S/S (note: rifampin can lower INR d/t induction of CYP system- patient will likely require higher doses of warfarin while on rifampin.)  Goal of Therapy:  INR 2-3 Monitor platelets by anticoagulation protocol: Yes   Plan:  - Warfarin 7.5 mg x1 tonight - Monitor daily INR, CBC and signs/symptoms of further bleeding - Follow drug-drug interactions  Janazia Schreier D. Asah Lamay, PharmD, BCPS Clinical Pharmacist Pager: [phone] 11/25/2016 12:08 PM

## 2016-11-25 NOTE — Progress Notes (Signed)
Daughter [name] calling, asking to speak with Dr [place] Poli.text page sent with tel number

## 2016-11-26 LAB — GLUCOSE, CAPILLARY
GLUCOSE-CAPILLARY: 130 mg/dL — AB (ref 65–99)
Glucose-Capillary: 278 mg/dL — ABNORMAL HIGH (ref 65–99)
Glucose-Capillary: 168 mg/dL — ABNORMAL HIGH (ref 65–99)

## 2016-11-26 LAB — PROTIME-INR
INR: 2.12
Prothrombin Time: 24.1 seconds — ABNORMAL HIGH (ref 11.4–15.2)

## 2016-11-26 MED ORDER — WARFARIN SODIUM 5 MG PO TABS: 5.0000 mg | ORAL_TABLET | Freq: Once | ORAL | Status: DC

## 2016-11-26 NOTE — Progress Notes (Signed)
ANTICOAGULATION CONSULT NOTE - Follow Up Consult  Pharmacy Consult for warfarin  Indication: atrial fibrillation and bioprosthetic MVR  No Known Allergies  Patient Measurements: Height: 5\' 2"  (157.5 cm) Weight: 134 lb 4.2 oz (60.9 kg) IBW/kg (Calculated) : 50.1  Vital Signs: Temp: 98 F (36.7 C) (12/24 0800) Temp Source: Oral (12/24 0800) BP: 103/65 (12/24 0806) Pulse Rate: 94 (12/24 0806)  Labs:  Recent Labs  11/24/16 0435 11/24/16 0851 11/25/16 0202 11/25/16 0800 11/25/16 1030 11/26/16 0400  HGB  --   --  8.5*  --   --   --   HCT  --   --  27.7*  --   --   --   PLT  --   --  413*  --   --   --   LABPROT 20.1*  --  19.6*  --   --  24.1*  INR 1.70  --  1.64  --   --  2.12  CREATININE  --  2.66*  --  3.54* 3.21*  --     Estimated Creatinine Clearance: 16.2 mL/min (by C-G formula based on SCr of 3.21 mg/dL (H)).   Assessment: 60 yo F presents on 11/30 with SOB. On Coumadin PTA for Afib, also has a recent bioprosthetic MVR  Continues on warfarin for afib and hx of bioprosthetic MVR completed in 08/2016. Admit INR 1.95. Warfarin re-started 12/13. Current INR 2.12  PTA dose 4mg  M/W/F and 2mg  T/T/S/S (note: rifampin can lower INR d/t induction of CYP system- patient will likely require higher doses of warfarin while on rifampin.)  Goal of Therapy:  INR 2-3 Monitor platelets by anticoagulation protocol: Yes   Plan:  - Warfarin 5 mg x1 tonight - Monitor daily INR, CBC and signs/symptoms of further bleeding - Follow drug-drug interactions  Thank you Anette Guarneri, PharmD [phone] 11/26/2016 9:47 AM

## 2016-11-26 NOTE — Progress Notes (Signed)
Patient report called to [place] .  [facility] called at [phone] transport probably in an hour and will call back Jerral Bonito [phone].

## 2016-11-26 NOTE — Progress Notes (Addendum)
[facility] from [phone] they called at [phone] and gave bed [facility] [address].  I was asked to call back for report in 30 minutes.  Upon returning the call the charge nurse had told bed control they were at the maximum limit of vent patients and room is on hold at this time. Informed daughter in the room and attempted to call [name] several times.  Bed still pending.

## 2016-11-26 NOTE — Progress Notes (Signed)
Patient Daughter Ms Deborah [name] came to the nurses station yelling and screaming that Nurse ":Estill Bamberg is not allowed in my mothers room". And "I dont care if everyone else is busy". Estill Bamberg RN had gone to the room after NT  [facility] requested assistance to pull up patient in bed. Patient Daughter was asking to speak to the charge nurse and unit Director. Administrator on duty [facility])  Made aware and came to the unit. Clarissa AD made aware via phone. Charge nurse also made aware.  When I returned to the patient room, patient was stable and lying in bed comfortably without any complains.Daughter Deborah [name] was sitting in the reclining chair  uncomplaining. Both the patient and Patient daughter were informed of bed availability at [facility] and that transportation [facility] is also aware and patient has been placed on the list.

## 2016-11-26 NOTE — Progress Notes (Signed)
-  Daughter [name] by the bedside.Sighned transfer consent.Patient transferred to [facility] via care link

## 2016-11-26 NOTE — Progress Notes (Signed)
[facility] ROUNDING NOTE   Subjective:   Interval History:  Nurse informs me that patient is being transferred to [facility]   Objective:  Vital signs in last 24 hours:  Temp:  [97.9 F (36.6 C)-99.2 F (37.3 C)] 98 F (36.7 C) (12/24 0800) Pulse Rate:  [90-104] 94 (12/24 0806) Resp:  [18-26] 18 (12/24 0806) BP: (94-130)/(53-70) 103/65 (12/24 0806) SpO2:  [96 %-100 %] 100 % (12/24 0806) FiO2 (%):  [28 %-30 %] 30 % (12/24 0806) Weight:  [60.1 kg (132 lb 7.9 oz)-62.1 kg (136 lb 14.5 oz)] 60.9 kg (134 lb 4.2 oz) (12/24 0338)  Weight change: 0.8 kg (1 lb 12.2 oz) Filed Weights   11/25/16 1100 11/25/16 1525 11/26/16 0338  Weight: 62.1 kg (136 lb 14.5 oz) 60.1 kg (132 lb 7.9 oz) 60.9 kg (134 lb 4.2 oz)    Intake/Output: I/O last 3 completed shifts: In: 2230 [I.V.:260; Other:120; NG/GT:1750; IV Piggyback:100] Out: 2000 [Other:2000]   Intake/Output this shift:  No intake/output data recorded.  Trach   CVS- RRR RS- CTA ABD- BS present soft non-distended EXT- no edema   Basic Metabolic Panel:  Recent Labs Lab 11/21/16 0700 11/23/16 0630 11/23/16 0730 11/24/16 0851 11/25/16 0800 11/25/16 1030  NA 125* 132* 131* 134* 130* 130*  K 4.2 4.5 4.6 4.0 3.8 4.0  CL 90* 94* 95* 94* 91* 91*  CO2 25 31 27 30 28 28   GLUCOSE 434* 169* 197* 176* 134* 132*  BUN 43* 35* 37* 30* 45* 43*  CREATININE 4.21* 3.40* 3.47* 2.66* 3.54* 3.21*  CALCIUM 8.1* 8.4* 8.7* 8.4* 8.3* 8.3*  MG  --   --   --  2.1 2.2  --   PHOS 2.6  --  2.3*  --   --  3.5    Liver Function Tests:  Recent Labs Lab 11/21/16 0700 11/23/16 0730 11/25/16 1030  ALBUMIN 1.7* 1.7* 1.7*   No results for input(s): LIPASE, AMYLASE in the last 168 hours. No results for input(s): AMMONIA in the last 168 hours.  CBC:  Recent Labs Lab 11/21/16 0700 11/22/16 0220 11/23/16 0730 11/25/16 0202  WBC 14.2* 13.8* 13.6* 8.3  HGB 8.6* 8.9* 8.5* 8.5*  HCT 27.1* 29.1* 27.5* 27.7*  MCV 87.4 89.3 89.3 89.4  PLT  329 359 361 413*    Cardiac Enzymes: No results for input(s): CKTOTAL, CKMB, CKMBINDEX, TROPONINI in the last 168 hours.  BNP: Invalid input(s): POCBNP  CBG:  Recent Labs Lab 11/25/16 1646 11/25/16 2006 11/25/16 2315 11/26/16 0318 11/26/16 0803  GLUCAP 113* 106* 159* 278* 130*    Microbiology: Results for orders placed or performed during the hospital encounter of 11/02/16  MRSA PCR Screening     Status: Abnormal   Collection Time: 11/03/16  5:42 AM  Result Value Ref Range Status   MRSA by PCR POSITIVE (A) NEGATIVE Final    Comment:        The GeneXpert MRSA Assay (FDA approved for NASAL specimens only), is one component of a comprehensive MRSA colonization surveillance program. It is not intended to diagnose MRSA infection nor to guide or monitor treatment for MRSA infections. RESULT CALLED TO, READ BACK BY AND VERIFIED WITH: M.SCHMIT RN AT [phone] 11/03/16 BY A.DAVIS   Culture, blood (Routine X 2) w Reflex to ID Panel     Status: Abnormal   Collection Time: 11/07/16  7:02 PM  Result Value Ref Range Status   Specimen Description BLOOD RIGHT ANTECUBITAL  Final   Special Requests IN  PEDIATRIC BOTTLE 2CC  Final   Culture  Setup Time   Final    AEROBIC BOTTLE ONLY GRAM POSITIVE COCCI Organism ID to follow CRITICAL RESULT CALLED TO, READ BACK BY AND VERIFIED WITH: Narda Bonds, PHARMD @2343  11/08/16 MKELLY,MLT    Culture STAPHYLOCOCCUS AUREUS (A)  Final   Report Status 11/11/2016 FINAL  Final   Organism ID, Bacteria STAPHYLOCOCCUS AUREUS  Final      Susceptibility   Staphylococcus aureus - MIC*    CIPROFLOXACIN 2 INTERMEDIATE Intermediate     ERYTHROMYCIN >=8 RESISTANT Resistant     GENTAMICIN <=0.5 SENSITIVE Sensitive     OXACILLIN 0.5 SENSITIVE Sensitive     TETRACYCLINE <=1 SENSITIVE Sensitive     VANCOMYCIN <=0.5 SENSITIVE Sensitive     TRIMETH/SULFA <=10 SENSITIVE Sensitive     CLINDAMYCIN <=0.25 RESISTANT Resistant     RIFAMPIN <=0.5 SENSITIVE Sensitive      Inducible Clindamycin POSITIVE Resistant     * STAPHYLOCOCCUS AUREUS  Blood Culture ID Panel (Reflexed)     Status: Abnormal   Collection Time: 11/07/16  7:02 PM  Result Value Ref Range Status   Enterococcus species NOT DETECTED NOT DETECTED Final   Listeria monocytogenes NOT DETECTED NOT DETECTED Final   Staphylococcus species DETECTED (A) NOT DETECTED Final    Comment: CRITICAL RESULT CALLED TO, READ BACK BY AND VERIFIED WITH: JAMES LEDFORD, PHARMD @2343  11/08/16 MKELLY,MLT    Staphylococcus aureus DETECTED (A) NOT DETECTED Final    Comment: CRITICAL RESULT CALLED TO, READ BACK BY AND VERIFIED WITH: JAMES LEDFORD, PHARMD @2343  11/08/16 MKELLY,MLT    Methicillin resistance NOT DETECTED NOT DETECTED Final   Streptococcus species NOT DETECTED NOT DETECTED Final   Streptococcus agalactiae NOT DETECTED NOT DETECTED Final   Streptococcus pneumoniae NOT DETECTED NOT DETECTED Final   Streptococcus pyogenes NOT DETECTED NOT DETECTED Final   Acinetobacter baumannii NOT DETECTED NOT DETECTED Final   Enterobacteriaceae species NOT DETECTED NOT DETECTED Final   Enterobacter cloacae complex NOT DETECTED NOT DETECTED Final   Escherichia coli NOT DETECTED NOT DETECTED Final   Klebsiella oxytoca NOT DETECTED NOT DETECTED Final   Klebsiella pneumoniae NOT DETECTED NOT DETECTED Final   Proteus species NOT DETECTED NOT DETECTED Final   Serratia marcescens NOT DETECTED NOT DETECTED Final   Haemophilus influenzae NOT DETECTED NOT DETECTED Final   Neisseria meningitidis NOT DETECTED NOT DETECTED Final   Pseudomonas aeruginosa NOT DETECTED NOT DETECTED Final   Candida albicans NOT DETECTED NOT DETECTED Final   Candida glabrata NOT DETECTED NOT DETECTED Final   Candida krusei NOT DETECTED NOT DETECTED Final   Candida parapsilosis NOT DETECTED NOT DETECTED Final   Candida tropicalis NOT DETECTED NOT DETECTED Final  Culture, blood (Routine X 2) w Reflex to ID Panel     Status: Abnormal   Collection  Time: 11/07/16  7:08 PM  Result Value Ref Range Status   Specimen Description BLOOD RIGHT HAND  Final   Special Requests BOTTLES DRAWN AEROBIC ONLY 5CC  Final   Culture  Setup Time   Final    GRAM POSITIVE COCCI IN CLUSTERS AEROBIC BOTTLE ONLY CRITICAL VALUE NOTED.  VALUE IS CONSISTENT WITH PREVIOUSLY REPORTED AND CALLED VALUE.    Culture (A)  Final    STAPHYLOCOCCUS AUREUS SUSCEPTIBILITIES PERFORMED ON PREVIOUS CULTURE WITHIN THE LAST 5 DAYS.    Report Status 11/11/2016 FINAL  Final  Culture, blood (Routine X 2) w Reflex to ID Panel     Status: None   Collection Time: 11/09/16  6:05 PM  Result Value Ref Range Status   Specimen Description BLOOD RIGHT HAND  Final   Special Requests BOTTLES DRAWN AEROBIC ONLY [place]  Final   Culture NO GROWTH 5 DAYS  Final   Report Status 11/14/2016 FINAL  Final  Cath Tip Culture     Status: None   Collection Time: 11/11/16  2:58 PM  Result Value Ref Range Status   Specimen Description CATH TIP  Final   Special Requests NONE  Final   Culture NO GROWTH 3 DAYS  Final   Report Status 11/15/2016 FINAL  Final    Coagulation Studies:  Recent Labs  11/24/16 0435 11/25/16 0202 11/26/16 0400  LABPROT 20.1* 19.6* 24.1*  INR 1.70 1.64 2.12    Urinalysis: No results for input(s): COLORURINE, LABSPEC, PHURINE, GLUCOSEU, HGBUR, BILIRUBINUR, KETONESUR, PROTEINUR, UROBILINOGEN, NITRITE, LEUKOCYTESUR in the last 72 hours.  Invalid input(s): APPERANCEUR    Imaging: No results found.   Medications:   . feeding supplement (VITAL AF 1.2 CAL) 1,000 mL (11/25/16 1901)   . amiodarone  200 mg Per Tube Daily  . atorvastatin  20 mg Per Tube q1800  .  ceFAZolin (ANCEF) IV  2 g Intravenous Q T,Th,Sat-1800  . chlorhexidine gluconate (MEDLINE KIT)  15 mL Mouth Rinse BID  . clonazePAM  0.5 mg Per Tube BID  . clopidogrel  75 mg Per Tube Daily  . darbepoetin (ARANESP) injection - DIALYSIS  100 mcg Intravenous Q Sat-HD  . docusate  100 mg Per Tube BID  .  feeding supplement (PRO-STAT SUGAR FREE 64)  30 mL Oral BID  . guaiFENesin-codeine  5 mL Per Tube Q6H  . insulin aspart  0-9 Units Subcutaneous Q4H  . insulin aspart  6 Units Subcutaneous Q6H  . insulin detemir  20 Units Subcutaneous BID  . mouth rinse  15 mL Mouth Rinse QID  . metoCLOPramide  10 mg Per Tube BID  . midodrine  10 mg Per Tube q morning - 10a  . multivitamin  1 tablet Oral QHS  . pantoprazole sodium  40 mg Per Tube Daily  . rifampin  300 mg Per Tube Q8H  . sennosides  5 mL Per Tube BID  . sodium chloride flush  3 mL Intravenous Q12H  . warfarin  5 mg Oral ONCE-1800  . Warfarin - Pharmacist Dosing Inpatient   Does not apply q1800   sodium chloride, sodium chloride, alteplase, fentaNYL (SUBLIMAZE) injection, fluticasone, heparin, levalbuterol, lidocaine (PF), lidocaine-prilocaine, ondansetron (ZOFRAN) IV, pentafluoroprop-tetrafluoroeth, prochlorperazine  Assessment/ Plan:  60 year old female with significant recent medical history with prolonged intubation requiring tracheostomy placement and subsequent decannulation. Patient suffered a STEMI with papillary muscle rupture requiring surgical repair in September. Also known underlying atrial flutter status post cardioversion 1. MSSA bacteremia/HD linesepsis: s/p HD cath removal, using L AVG. Refused TEE, empiric 6 wks IV abx per ID 2. Vocal cord paralysis, s/p repeat trach12/7: Remains on vent, weaning now 3. 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 No signs of recovery. Next HD 12/26  Being transferred [facility] 4. Anemia: Hgb 8.9.Continue Aranesp 1107mg weekly. 5. MBD: Ca/Phos/PTH ok. No meds for now. 6. BP/Volume: OP EDW 56 kg. No edema on exam, run even on HD. CXR 12/8 without pulm edema. HD today. Net UF 500 post wt 55.7. Had issues with cramping. BP stable. May need to allow EDW to drift up.  7. Nutrition: Albumin 1.7.Tube feeds ongoing, + prostat. Severe protein calorie malnutrition. 8.  Hypophosphatemia:  Phos 2.9. 9. Hypokalemia: 2 K bath  10. 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 Atrial fibrillation: Per primary. On amiodarone/warfarin. 12. DM: Per primary   LOS: [address] @TODAY @10 :03 AM

## 2016-11-26 NOTE — Progress Notes (Signed)
Patient transferred to [facility] yesterday, did not go because of bed availability. Informed by nursing staff should read today, transportation dispatched.  Deborah [name] Pager: [phone] 11/26/2016, 10:29 AM

## 2016-11-26 NOTE — Progress Notes (Signed)
Talked to [facility] via phone and informed her of the transfer to [facility] ICU room [phone].  And stated "she was grateful for all our help".

## 2016-11-27 NOTE — Consult Note (Signed)
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 Donzetta Matters and using LIJ perm cath . Admitted 11/30  with SOB / She had  OP HD at [facility] TTS Dr. Juleen China follows/ [facility]) yesterday  For 3.5 hrs with OP RN report of SOB but did not want to come to [facility].   Pre wt 55.8 and post wt 56.0 kg she went home> became progressively more sob. Noted Admit CXR showing Lungs clear and  Admit team  Reviewed CT neck done recently in rehab and did not show tracheostenosis She was decannulated 10/25 .   Objective Vital signs in last 24 hours: Vitals:   11/26/16 1200 11/26/16 1243 11/26/16 1300 11/26/16 1349  BP: 115/71 115/71 106/64   Pulse: 98 88 87 88  Resp: 20 18 18    Temp: 98.5 F (36.9 C)     TempSrc: Oral     SpO2: 100% 100% 100% 99%  Weight:      Height:       Weight change:   Physical Exam: General: alert thin AAF chronically ill  / noted some resp .type Stridor  And not in Distress  Heart: RRR no mur, rub or gallop Lungs: Insp soft wheezes, and coarse resp sounds  Abdomen: bs pos  Soft , NT, ND, Extremities: no pedal edema Dialysis Access:  L IJ Perm cath /pos bruit LUA AVG   Op Dialysis orders=  [place] [facility]  ( Dr. Juleen China follows/ [facility] )4hrs, 2k, 2.5 ca bath  LIJ perm cath, LUA AVF (11/02/16 ) no treatment meds listed .  Background: 60 y/o AAF with DM2, HTN, HLD presented to [facility] ER with infposterior STEMI on 9/12->cath->. PCS with stent x 2 by Dr. Clayborn Bigness. EF 55-60% with severe MR. Post-cath respiratory failure ->intubation. Shock and renal failure (creatinine 1.6 on adm). 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 We were asked to see d/t progressive rise in creatinine/volume overload/anuric AKI . CRRT re-initiated 08/21/16-9/20, then 9/21 post MVR. Transitioned to IHD.  Dialysis dependent since 08/17/16. No recovery. New ESRD.  Problem/Plan: 1.  SOB/ Resp  Distress  -WU per admit  , noted ENT consulted  to evaluate and inspect for tracheal stenosis/ with ho prolonged Intubation/ tracheostomy s/p decannulation about one month ago.  - no need for HD today. 2. ESRD - HD  TTS schedule ( as op Dr. Juleen China follows/ [facility] ) 3. Anemia - hgb 11.6 no esa need, fu hgb trend  4. Secondary hyperparathyroidism -  Fosrenol binder/ no op vit d   5. HTN/volume - bp stable  uf tomorrow  -  2 l-2.5l , needs EDW established (was listed  80 kg AS OP, in error I believe)  6. Paroxysmal atrial fibrillation  -per admit amiodarone,warfarin 7. HO recent NSTEMI with shock 8. 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 HD tomorrow. No evidence pulm edema / vol overload at this time. CXR clear.  Plan lower dry wt if possible w HD tomorrow.  Kelly Splinter MD [facility] pager [phone]   11/03/2016, 12:36 PM    Labs: Basic Metabolic Panel:  Recent Labs Lab 11/21/16 0700  11/23/16 0730 11/24/16 0851 11/25/16 0800 11/25/16 1030  NA 125*  < > 131* 134* 130* 130*  K 4.2  < > 4.6 4.0 3.8 4.0  CL 90*  < > 95* 94* 91* 91*  CO2 25  < > 27 30 28 28   GLUCOSE 434*  < > 197* 176* 134* 132*  BUN 43*  < > 37* 30* 45* 43*  CREATININE 4.21*  < > 3.47* 2.66* 3.54* 3.21*  CALCIUM 8.1*  < > 8.7* 8.4* 8.3* 8.3*  PHOS 2.6  --  2.3*  --   --  3.5  < > = values in this interval not displayed. Liver Function Tests:  Recent Labs Lab 11/21/16 0700 11/23/16 0730 11/25/16 1030  ALBUMIN 1.7* 1.7* 1.7*   No results for input(s):  LIPASE, AMYLASE in the last 168 hours. No results for input(s): AMMONIA in the last 168 hours. CBC:  Recent Labs Lab 11/21/16 0700 11/22/16 0220 11/23/16 0730 11/25/16 0202  WBC 14.2* 13.8* 13.6* 8.3  HGB 8.6* 8.9* 8.5* 8.5*  HCT 27.1* 29.1* 27.5* 27.7*  MCV 87.4 89.3 89.3 89.4  PLT 329 359 361 413*   Cardiac Enzymes: No results for input(s): CKTOTAL, CKMB, CKMBINDEX, TROPONINI in the last 168 hours. CBG:  Recent Labs Lab 11/25/16 2006 11/25/16 2315 11/26/16 0318 11/26/16 0803 11/26/16 1235  GLUCAP 106* 159* 278* 130* 168*    Studies/Results: No results found. Medications:

## 2016-12-09 DIAGNOSIS — F4322 Adjustment disorder with anxiety: Secondary | ICD-10-CM | POA: Insufficient documentation

## 2017-07-03 ENCOUNTER — Emergency Department: Payer: Medicaid Other

## 2017-07-03 ENCOUNTER — Emergency Department
Admission: EM | Admit: 2017-07-03 | Discharge: 2017-07-03 | Disposition: A | Discharge status: Home / Self Care | Payer: Medicaid Other | Attending: Emergency Medicine | Admitting: Emergency Medicine

## 2017-07-03 DIAGNOSIS — I120 Hypertensive chronic kidney disease with stage 5 chronic kidney disease or end stage renal disease: Secondary | ICD-10-CM | POA: Insufficient documentation

## 2017-07-03 DIAGNOSIS — Z7902 Long term (current) use of antithrombotics/antiplatelets: Secondary | ICD-10-CM | POA: Insufficient documentation

## 2017-07-03 DIAGNOSIS — Z794 Long term (current) use of insulin: Secondary | ICD-10-CM | POA: Insufficient documentation

## 2017-07-03 DIAGNOSIS — I4892 Unspecified atrial flutter: Secondary | ICD-10-CM | POA: Insufficient documentation

## 2017-07-03 DIAGNOSIS — Z992 Dependence on renal dialysis: Secondary | ICD-10-CM | POA: Diagnosis not present

## 2017-07-03 DIAGNOSIS — E119 Type 2 diabetes mellitus without complications: Secondary | ICD-10-CM | POA: Diagnosis not present

## 2017-07-03 DIAGNOSIS — I480 Paroxysmal atrial fibrillation: Secondary | ICD-10-CM | POA: Insufficient documentation

## 2017-07-03 DIAGNOSIS — Z79899 Other long term (current) drug therapy: Secondary | ICD-10-CM | POA: Insufficient documentation

## 2017-07-03 DIAGNOSIS — J9500 Unspecified tracheostomy complication: Secondary | ICD-10-CM | POA: Insufficient documentation

## 2017-07-03 DIAGNOSIS — I2510 Atherosclerotic heart disease of native coronary artery without angina pectoris: Secondary | ICD-10-CM | POA: Diagnosis not present

## 2017-07-03 DIAGNOSIS — Z7901 Long term (current) use of anticoagulants: Secondary | ICD-10-CM | POA: Diagnosis not present

## 2017-07-03 DIAGNOSIS — N186 End stage renal disease: Secondary | ICD-10-CM | POA: Diagnosis not present

## 2017-07-03 DIAGNOSIS — I252 Old myocardial infarction: Secondary | ICD-10-CM | POA: Diagnosis not present

## 2017-07-03 DIAGNOSIS — R0602 Shortness of breath: Secondary | ICD-10-CM | POA: Diagnosis present

## 2017-07-03 NOTE — ED Provider Notes (Signed)
[facility] Emergency Department Provider Note  ____________________________________________   First MD Initiated Contact with Patient 07/03/17 1105     (approximate)  I have reviewed the triage vital signs and the nursing notes.   HISTORY  Chief Complaint Shortness of Breath   HPI GIMENA BUICK is a 61 y.o. female who comes to the emergency department with 1 day of shortness of breath. She has had a tracheostomy for the past 7 months. Her home health nurse today change the inner cannula but it did not seem to help.According to her home health nurse she is not suctioning up more sputum than normal although there is some blood. The symptoms began suddenly today. They do not seem to be getting worse but are constant.   Past Medical History:  Diagnosis Date  . Diabetes mellitus   . Hyperlipidemia   . Hypertension   . MI, old   . Renal disorder    dialysls    Patient Active Problem List   Diagnosis Date Noted  . Bacteremia due to Staphylococcus aureus   . Staphylococcus aureus bacteremia with sepsis ([place])   . Uncontrolled type 2 diabetes mellitus with complication ([place])   . ST elevation myocardial infarction involving left main coronary artery ([place])   . Palliative care encounter   . Advance care planning   . Vocal cord dysfunction   . Palliative care by specialist   . Goals of care, counseling/discussion   . Bacteremia   . End-stage renal disease on hemodialysis ([place])   . Paroxysmal atrial fibrillation ([name])   . Heart attack ([place])   . Uncontrolled type 2 diabetes mellitus with complication, without long-term current use of insulin ([place])   . Staphylococcus aureus bacteremia 11/12/2016  . Vocal cord paralysis   . Respiratory distress 11/02/2016  . Chronic anticoagulation   . Acute lower UTI   . Labile blood glucose   . Leukocytosis   . Subtherapeutic international normalized ratio (INR)   . Supratherapeutic INR   . Cough   . Oropharyngeal  dysphagia   . Uncontrolled type 2 diabetes mellitus with complication, with long-term current use of insulin ([place])   . Physical debility 10/11/2016  . Metabolic encephalopathy 75/64/3329  . Debility   . ESRD on dialysis ([place])   . Type 2 diabetes mellitus with complication, with long-term current use of insulin ([place])   . Anemia of chronic disease   . Coronary artery disease involving native coronary artery of native heart without angina pectoris   . Acute on chronic respiratory failure with hypoxia ([place])   . Atrial flutter ([place])   . Persistent cough   . Dysphonia   . Sleep disturbance   . Diabetes mellitus type 2 in nonobese ([name])   . Stage 3 chronic kidney disease   . NSTEMI (non-ST elevated myocardial infarction) ([place])   . Tachypnea   . Hyponatremia   . Lymphocytosis   . Acute blood loss anemia   . Pressure injury of skin 09/18/2016  . Tracheostomy status ([place])   . Delirium   . Encounter for attention to tracheostomy ([place])   . History of MVR with cardiopulmonary bypass   . Systolic congestive heart failure ([place])   . HCAP (healthcare-associated pneumonia)   . PAF (paroxysmal atrial fibrillation) ([place])   . Acute systolic congestive heart failure ([place])   . AKI (acute kidney injury) ([place])   . Abnormal LFTs   . S/P MVR (mitral valve replacement)   . Mitral regurgitation  08/23/2016  . Acute on chronic diastolic heart failure ([place]) 08/18/2016  . Acute pulmonary edema ([name])   . Respiratory failure ([place]) 08/17/2016  . Cardiogenic shock ([place]) 08/17/2016  . Acute hypoxemic respiratory failure ([place])   . STEMI (ST elevation myocardial infarction) ([place]) 08/15/2016  . Noncompliance 01/25/2015  . Bereavement 07/09/2014  . Low back pain 02/17/2014  . Cervical cancer screening 11/20/2013  . Routine general medical examination at a health care facility 10/14/2013  . Chronic kidney disease, stage 3 06/14/2012  . Hypertension 03/08/2012  . Diabetes mellitus type 2, controlled 03/08/2012    . Hyperlipidemia 03/08/2012    Past Surgical History:  Procedure Laterality Date  . AV FISTULA PLACEMENT Left 10/02/2016   Procedure: INSERTION OF ARTERIOVENOUS (AV) GORE-TEX GRAFT ARM;  Surgeon: Waynetta Sandy, MD;  Location: [place];  Service: Vascular;  Laterality: Left;  . CARDIAC CATHETERIZATION N/A 08/15/2016   Procedure: Left Heart Cath and Coronary Angiography;  Surgeon: Yolonda Kida, MD;  Location: [place] CV LAB;  Service: Cardiovascular;  Laterality: N/A;  . CARDIAC CATHETERIZATION N/A 08/15/2016   Procedure: Coronary Stent Intervention;  Surgeon: Yolonda Kida, MD;  Location: [place] CV LAB;  Service: Cardiovascular;  Laterality: N/A;  . CARDIAC CATHETERIZATION N/A 08/18/2016   Procedure: Right Heart Cath;  Surgeon: Jolaine Artist, MD;  Location: [place] CV LAB;  Service: Cardiovascular;  Laterality: N/A;  . CARDIAC CATHETERIZATION N/A 08/18/2016   Procedure: IABP Insertion;  Surgeon: Jolaine Artist, MD;  Location: [place] [facility];  Service: Cardiovascular;  Laterality: N/A;  . CARDIAC CATHETERIZATION Right 08/23/2016   Procedure: CENTRAL LINE INSERTION RIGHT SUBCLAVIAN;  Surgeon: Ivin Poot, MD;  Location: [place];  Service: Open Heart Surgery;  Laterality: Right;  . ENDOVEIN HARVEST OF GREATER SAPHENOUS VEIN Left 08/23/2016   Procedure: ENDOVEIN HARVEST OF GREATER SAPHENOUS VEIN;  Surgeon: Ivin Poot, MD;  Location: [place];  Service: Open Heart Surgery;  Laterality: Left;  . INSERTION OF DIALYSIS CATHETER Bilateral 08/31/2016   Procedure: INSERTION OF DIALYSIS CATHETER LEFT INTERNAL JUGULAR VEIN & INSERTION OF TRIPLE LUMEN RIGHT INTERNAL JUGULAR VEIN;  Surgeon: Angelia Mould, MD;  Location: [place];  Service: Vascular;  Laterality: Bilateral;  . INTRAOPERATIVE TRANSESOPHAGEAL ECHOCARDIOGRAM N/A 08/23/2016   Procedure: INTRAOPERATIVE TRANSESOPHAGEAL ECHOCARDIOGRAM;  Surgeon: Ivin Poot, MD;  Location: [place];  Service: Open  Heart Surgery;  Laterality: N/A;  . IR GENERIC HISTORICAL  11/13/2016   IR US GUIDE VASC ACCESS LEFT 11/13/2016 Corrie Mckusick, DO [facility]INTER[facility]  . IR GENERIC HISTORICAL  11/13/2016   IR FLUORO GUIDE CV LINE LEFT 11/13/2016 Corrie Mckusick, DO [facility]  . IR GENERIC HISTORICAL  11/13/2016   IR GASTROSTOMY TUBE MOD SED 11/13/2016 Corrie Mckusick, DO [facility]  . MITRAL VALVE REPAIR N/A 08/23/2016   Procedure: MITRAL VALVE REPAIR (MVR) USING 25MM EDWARDS MAGNA EASE BIOPROSTHESIS MITRAL  VALVE;  Surgeon: Ivin Poot, MD;  Location: [place];  Service: Open Heart Surgery;  Laterality: N/A;  . tracheostomy reversal    . TRACHEOSTOMY TUBE PLACEMENT N/A 11/09/2016   Procedure: TRACHEOSTOMY;  Surgeon: Melissa Montane, MD;  Location: [place];  Service: ENT;  Laterality: N/A;  . VAGINAL DELIVERY     x 6    Prior to Admission medications   Medication Sig Start Date End Date Taking? Authorizing Provider  albuterol (PROVENTIL) (2.5 MG/3ML) 0.083% nebulizer solution Take 3 mLs (2.5 mg total) by nebulization 4 (four) times daily - after  meals and at bedtime. 11/01/16   Love, Ivan Anchors, PA-C  ALPRAZolam (XANAX) 0.25 MG tablet Take 1 tablet (0.25 mg total) by mouth 2 (two) times daily as needed for anxiety. 11/01/16   Love, Ivan Anchors, PA-C  amiodarone (PACERONE) 200 MG tablet Take 1 tablet (200 mg total) by mouth daily. 11/01/16   Love, Ivan Anchors, PA-C  atorvastatin (LIPITOR) 20 MG tablet Take 1 tablet (20 mg total) by mouth daily. 11/01/16   Love, Ivan Anchors, PA-C  clopidogrel (PLAVIX) 75 MG tablet Take 1 tablet (75 mg total) by mouth daily. 11/01/16   Love, Ivan Anchors, PA-C  Darbepoetin Alfa (ARANESP) 100 MCG/0.5ML SOSY injection Inject 0.5 mLs (100 mcg total) into the vein every Saturday with hemodialysis. 12/02/16   Verlee Monte, MD  guaiFENesin (ROBITUSSIN) 100 MG/5ML SOLN Take 30 mLs (600 mg total) by mouth every 6 (six) hours. 11/01/16   Love, Ivan Anchors, PA-C  hydrocortisone (ANUSOL-HC) 2.5 % rectal cream Place  rectally 2 (two) times daily as needed for hemorrhoids or itching. 11/01/16   Love, Ivan Anchors, PA-C  Insulin Detemir (LEVEMIR) 100 UNIT/ML Pen Inject 8 Units into the skin daily at 10 pm. 11/01/16   Love, Ivan Anchors, PA-C  metoCLOPramide (REGLAN) 5 MG tablet Take 1 tablet (5 mg total) by mouth 2 (two) times daily with a meal. 11/01/16   Love, Ivan Anchors, PA-C  midodrine (PROAMATINE) 10 MG tablet Take 1 tablet (10 mg total) by mouth every morning. 11/02/16   Love, Ivan Anchors, PA-C  pantoprazole (PROTONIX) 40 MG tablet Take 1 tablet (40 mg total) by mouth 2 (two) times daily. 11/01/16   Bary Leriche, PA-C  warfarin (COUMADIN) 2 MG tablet Take 2 mg on Tue, Thu, Sat, Sun 11/01/16   Bary Leriche, PA-C  warfarin (COUMADIN) 4 MG tablet Take 4 mg on Mon, Wed, Fri with supper 11/01/16   Love, Ivan Anchors, PA-C    Allergies Patient has no known allergies.  Family History  Problem Relation Age of Onset  . Hypertension Mother   . Diabetes Mother   . Breast cancer Sister     Social History Social History  Substance Use Topics  . Smoking status: Never Smoker  . Smokeless tobacco: Never Used  . Alcohol use No    Review of Systems Constitutional: No fever/chills Eyes: No visual changes. ENT: No sore throat. Cardiovascular: Denies chest pain. Respiratory: Positive shortness of breath. Gastrointestinal: No abdominal pain.  No nausea, no vomiting.  No diarrhea.  No constipation. Genitourinary: Negative for dysuria. Musculoskeletal: Negative for back pain. Skin: Negative for rash. Neurological: Negative for headaches, focal weakness or numbness.   ____________________________________________   PHYSICAL EXAM:  VITAL SIGNS: ED Triage Vitals  Enc Vitals Group     BP      Pulse      Resp      Temp      Temp src      SpO2      Weight      Height      Head Circumference      Peak Flow      Pain Score      Pain Loc      Pain Edu?      Excl. in [place]?     Constitutional: Alert and oriented  4 somewhat anxious appearing Eyes: PERRL EOMI. Head: Atraumatic. Nose: No congestion/rhinnorhea. Mouth/Throat: Tracheostomy in place [facility] dislodged Neck: No stridor.   Cardiovascular: Normal rate, regular rhythm. Grossly normal heart  sounds.  Good peripheral circulation. Respiratory: Normal respiratory effort.  No retractions. Lungs CTAB and moving good air Gastrointestinal: Soft nontender Musculoskeletal: No lower extremity edema   Neurologic:   No gross focal neurologic deficits are appreciated. Skin:  Skin is warm, dry and intact. No rash noted. Psychiatric: Mood and affect are normal.     ____________________________________________   DIFFERENTIAL includes but not limited to  Tracheitis, pneumothorax, pneumonia, dislodged tracheostomy tube, tracheal stenosis ____________________________________________   LABS (all labs ordered are listed, but only abnormal results are displayed)  Labs Reviewed - No data to display   __________________________________________  EKG   ____________________________________________  RADIOLOGY  Chest x-ray with tracheostomy tube in good position ____________________________________________   PROCEDURES  Procedure(s) performed:yes  Tracheostomy change: I changed the patient's tracheostomy with respiratory therapy at bedside. First I removed the inner cannula of recurrent tracheostomy, second IV placed a suction catheter through the current tracheostomy into her airway and the patient immediately began to cough. I then removed her current tracheostomy and inserted the 6-0 tracheostomy and then reinserted the inner cannula. Following the procedure the patient was saturating 100% and had clear breath sounds.  Procedures  Critical Care performed: no  Observation: no ____________________________________________   INITIAL IMPRESSION / ASSESSMENT AND PLAN / ED COURSE  Pertinent labs & imaging results that were available during my  care of the patient were reviewed by me and considered in my medical decision making (see chart for details).  On arrival the patient is somewhat anxious appearing with a partially dislodged tracheostomy tube although she is saturating 100%. I'll obtain an x-ray and then exchanged the tracheostomy.     ----------------------------------------- 12:12 PM on 07/03/2017 -----------------------------------------  The patient was saturating well but felt short of breath and her tracheostomy tube was half dislodged. With respiratory therapy at bedside I advanced the suction catheter through the tracheostomy as a tube changer removed her previous tracheostomy tube and inserted a new one. I inserted the 6-0 uncuffed fenestrated tracheostomy tube that she brought with her. I have a consult out to her otolaryngologist Dr. Brigitte Pulse at [facility] now. ____________________________________________  ----------------------------------------- 12:37 PM on 07/03/2017 -----------------------------------------  I discussed the case with the patient's otolaryngologist Dr. Brigitte Pulse who does not recommend any further intervention and she feels the patient is stable for discharge.  FINAL CLINICAL IMPRESSION(S) / ED DIAGNOSES  Final diagnoses:  Tracheostomy complication, unspecified complication type ([place])      NEW MEDICATIONS STARTED DURING THIS VISIT:  Discharge Medication List as of 07/03/2017  1:21 PM       Note:  This document was prepared using Dragon voice recognition software and may include unintentional dictation errors.     Darel Hong, MD 07/03/17 2022

## 2017-07-03 NOTE — ED Triage Notes (Addendum)
Per EMS pt had an open heart surgery and a trach was placed.  Size 6 uncuffed.  Pt is due for a trach change on the 13th of next month.  Pt c/o ShOB and feels that maybe her trach is clogged.  Pt is not normally on oxygen but is on 2L O2.  Pt is A&Ox4.  Pt is on dialysis and due for it today.

## 2017-07-03 NOTE — Discharge Instructions (Signed)
Please keep your follow-up appointment with your otolaryngologist Dr. Brigitte Pulse as scheduled. Return to the emergency department for any concerns.  It was a pleasure to take care of you today, and thank you for coming to our emergency department.  If you have any questions or concerns before leaving please ask the nurse to grab me and I'm more than happy to go through your aftercare instructions again.  If you were prescribed any opioid pain medication today such as Norco, Vicodin, Percocet, morphine, hydrocodone, or oxycodone please make sure you do not drive when you are taking this medication as it can alter your ability to drive safely.  If you have any concerns once you are home that you are not improving or are in fact getting worse before you can make it to your follow-up appointment, please do not hesitate to call [phone] and come back for further evaluation.  Darel Hong, MD  Dg Chest 2 View  Result Date: 07/03/2017 CLINICAL DATA:  Shortness of breath . EXAM: CHEST  2 VIEW COMPARISON:  11/23/2016.  11/10/2016 . FINDINGS: Tracheostomy tube noted in stable position. Prior cardiac valve replacement. Cardiomegaly with mild pulmonary vascular prominence. Mild increase in interstitial markings noted bilaterally . Mild CHF cannot be excluded. Low lung volumes with mild bibasilar atelectasis. IMPRESSION: 1. Tracheostomy tube noted in good anatomic position. 2. Prior cardiac valve replacement. Cardiomegaly. Mild increase interstitial markings noted bilaterally. Mild CHF cannot be completely excluded. 3. Low lung volumes with mild bibasilar atelectasis . Electronically Signed   By: Marcello Moores  Register   On: 07/03/2017 12:02

## 2017-07-12 ENCOUNTER — Encounter: Payer: Self-pay | Admitting: Emergency Medicine

## 2017-07-12 ENCOUNTER — Emergency Department
Admission: EM | Admit: 2017-07-12 | Discharge: 2017-07-12 | Disposition: A | Discharge status: Home / Self Care | Payer: Medicaid Other | Attending: Emergency Medicine | Admitting: Emergency Medicine

## 2017-07-12 DIAGNOSIS — N390 Urinary tract infection, site not specified: Secondary | ICD-10-CM | POA: Insufficient documentation

## 2017-07-12 DIAGNOSIS — Z992 Dependence on renal dialysis: Secondary | ICD-10-CM | POA: Diagnosis not present

## 2017-07-12 DIAGNOSIS — I132 Hypertensive heart and chronic kidney disease with heart failure and with stage 5 chronic kidney disease, or end stage renal disease: Secondary | ICD-10-CM | POA: Diagnosis not present

## 2017-07-12 DIAGNOSIS — Z7901 Long term (current) use of anticoagulants: Secondary | ICD-10-CM | POA: Insufficient documentation

## 2017-07-12 DIAGNOSIS — I2510 Atherosclerotic heart disease of native coronary artery without angina pectoris: Secondary | ICD-10-CM | POA: Insufficient documentation

## 2017-07-12 DIAGNOSIS — E1122 Type 2 diabetes mellitus with diabetic chronic kidney disease: Secondary | ICD-10-CM | POA: Insufficient documentation

## 2017-07-12 DIAGNOSIS — R531 Weakness: Secondary | ICD-10-CM | POA: Diagnosis not present

## 2017-07-12 DIAGNOSIS — Z79899 Other long term (current) drug therapy: Secondary | ICD-10-CM | POA: Diagnosis not present

## 2017-07-12 DIAGNOSIS — I5042 Chronic combined systolic (congestive) and diastolic (congestive) heart failure: Secondary | ICD-10-CM | POA: Diagnosis not present

## 2017-07-12 DIAGNOSIS — N186 End stage renal disease: Secondary | ICD-10-CM | POA: Insufficient documentation

## 2017-07-12 DIAGNOSIS — R42 Dizziness and giddiness: Secondary | ICD-10-CM | POA: Diagnosis not present

## 2017-07-12 LAB — COMPREHENSIVE METABOLIC PANEL
ALBUMIN: 3.6 g/dL (ref 3.5–5.0)
ALK PHOS: 166 U/L — AB (ref 38–126)
ALT: 30 U/L (ref 14–54)
ANION GAP: 8 (ref 5–15)
AST: 29 U/L (ref 15–41)
BUN: 46 mg/dL — ABNORMAL HIGH (ref 6–20)
CHLORIDE: 102 mmol/L (ref 101–111)
CO2: 27 mmol/L (ref 22–32)
Calcium: 9.1 mg/dL (ref 8.9–10.3)
Creatinine, Ser: 7.09 mg/dL — ABNORMAL HIGH (ref 0.44–1.00)
GFR calc Af Amer: 7 mL/min — ABNORMAL LOW (ref >60)
GFR calc non Af Amer: 6 mL/min — ABNORMAL LOW (ref >60)
Glucose, Bld: 205 mg/dL — ABNORMAL HIGH (ref 65–99)
POTASSIUM: 3.8 mmol/L (ref 3.5–5.1)
SODIUM: 137 mmol/L (ref 135–145)
Total Bilirubin: 0.8 mg/dL (ref 0.3–1.2)
Total Protein: 6.9 g/dL (ref 6.5–8.1)

## 2017-07-12 LAB — CBC WITH DIFFERENTIAL/PLATELET
BASOS PCT: 0 %
Basophils Absolute: 0.1 10*3/uL (ref 0–0.1)
EOS ABS: 0.2 10*3/uL (ref 0–0.7)
Eosinophils Relative: 1 %
HCT: 37.5 % (ref 35.0–47.0)
HEMOGLOBIN: 12 g/dL (ref 12.0–16.0)
Lymphocytes Relative: 6 %
Lymphs Abs: 0.9 10*3/uL — ABNORMAL LOW (ref 1.0–3.6)
MCH: 27.3 pg (ref 26.0–34.0)
MCHC: 32 g/dL (ref 32.0–36.0)
MCV: 85.4 fL (ref 80.0–100.0)
MONOS PCT: 9 %
Monocytes Absolute: 1.2 10*3/uL — ABNORMAL HIGH (ref 0.2–0.9)
NEUTROS PCT: 84 %
Neutro Abs: 11.7 10*3/uL — ABNORMAL HIGH (ref 1.4–6.5)
Platelets: 194 10*3/uL (ref 150–440)
RBC: 4.4 MIL/uL (ref 3.80–5.20)
RDW: 16.5 % — AB (ref 11.5–14.5)
WBC: 14 10*3/uL — ABNORMAL HIGH (ref 3.6–11.0)

## 2017-07-12 LAB — URINALYSIS, ROUTINE W REFLEX MICROSCOPIC
Bilirubin Urine: NEGATIVE
Glucose, UA: NEGATIVE mg/dL
KETONES UR: NEGATIVE mg/dL
NITRITE: NEGATIVE
PH: 5 (ref 5.0–8.0)
Protein, ur: 100 mg/dL — AB
SPECIFIC GRAVITY, URINE: 1.016 (ref 1.005–1.030)

## 2017-07-12 LAB — TROPONIN I: Troponin I: 0.03 ng/mL (ref <0.03)

## 2017-07-12 MED ORDER — ALBUTEROL SULFATE (2.5 MG/3ML) 0.083% IN NEBU
2.5000 mg | INHALATION_SOLUTION | Freq: Once | RESPIRATORY_TRACT | Status: AC
  Administered 2017-07-12: 2.5 mg via RESPIRATORY_TRACT
  Filled 2017-07-12: qty 3

## 2017-07-12 MED ORDER — CEPHALEXIN 500 MG PO CAPS: 500.0000 mg | ORAL_CAPSULE | Freq: Two times a day (BID) | ORAL | 0 refills | Status: DC

## 2017-07-12 MED ORDER — DEXTROSE 5 % IV SOLN
1.0000 g | INTRAVENOUS | Status: AC
  Administered 2017-07-12: 1 g via INTRAVENOUS
  Filled 2017-07-12: qty 10

## 2017-07-12 NOTE — ED Triage Notes (Signed)
Pt was scheduled for dialysis today and declined d/t feeling weak. No specific complaints. Hx of mi, trach and stent placement since sept 2017

## 2017-07-12 NOTE — ED Notes (Signed)
Pt requested a breathing tx before being discharged. Pt receiving as d/c instructions given. W/c in room for discharge.

## 2017-07-12 NOTE — ED Provider Notes (Signed)
[facility] Emergency Department Provider Note  ____________________________________________   First MD Initiated Contact with Patient 07/12/17 1246     (approximate)  I have reviewed the triage vital signs and the nursing notes.   HISTORY  Chief Complaint Weakness    HPI Deborah [name] is a 61 y.o. female with extensive chronic medical history including chronic trach and ESRD on dialysis (T, Th, Sat).  She presents today for evaluation of generalized weakness.  She reports some dizziness (room spinning) earlier today but that has resolved.  She has no specific focal Neurological deficits, she just feels overall weak.  She describes her symptoms as moderate to severe.  Her home nurse is with her today.  Of note she was seen about 10 days ago in this emergency department after her trach became dislodged and her blood pressures were significantly elevated that day but her vital signs are all reassuring today.  other than feeling vertiginous earlier today, she denies anyadditional symptoms, specifically including fever/chills, chest pain, shortness of breath, cough, nausea, vomiting, abdominal pain, dysuria.  Even though she is end-stage renal on dialysis she still says that she urinates several times a day.  Both she and her nurse state that she has not had any foul-smelling urine recently.  She has had some slightly increased bilateral edema in her feet compared with her baseline but she has no associated redness nor pain.  He denies headache, numbness/tingling in any of her extremities, andno acute weakness in any of her extremities.  as a result of her generalized weakness, she did not go to dialysis today.   Past Medical History:  Diagnosis Date  . Diabetes mellitus   . Hyperlipidemia   . Hypertension   . MI, old   . Renal disorder    dialysls    Patient Active Problem List   Diagnosis Date Noted  . Bacteremia due to Staphylococcus aureus   .  Staphylococcus aureus bacteremia with sepsis ([place])   . Uncontrolled type 2 diabetes mellitus with complication ([place])   . ST elevation myocardial infarction involving left main coronary artery ([place])   . Palliative care encounter   . Advance care planning   . Vocal cord dysfunction   . Palliative care by specialist   . Goals of care, counseling/discussion   . Bacteremia   . End-stage renal disease on hemodialysis ([place])   . Paroxysmal atrial fibrillation ([name])   . Heart attack ([place])   . Uncontrolled type 2 diabetes mellitus with complication, without long-term current use of insulin ([place])   . Staphylococcus aureus bacteremia 11/12/2016  . Vocal cord paralysis   . Respiratory distress 11/02/2016  . Chronic anticoagulation   . Acute lower UTI   . Labile blood glucose   . Leukocytosis   . Subtherapeutic international normalized ratio (INR)   . Supratherapeutic INR   . Cough   . Oropharyngeal dysphagia   . Uncontrolled type 2 diabetes mellitus with complication, with long-term current use of insulin ([place])   . Physical debility 10/11/2016  . Metabolic encephalopathy 44/31/5400  . Debility   . ESRD on dialysis ([place])   . Type 2 diabetes mellitus with complication, with long-term current use of insulin ([place])   . Anemia of chronic disease   . Coronary artery disease involving native coronary artery of native heart without angina pectoris   . Acute on chronic respiratory failure with hypoxia ([place])   . Atrial flutter ([place])   . Persistent cough   .  Dysphonia   . Sleep disturbance   . Diabetes mellitus type 2 in nonobese ([name])   . Stage 3 chronic kidney disease   . NSTEMI (non-ST elevated myocardial infarction) ([place])   . Tachypnea   . Hyponatremia   . Lymphocytosis   . Acute blood loss anemia   . Pressure injury of skin 09/18/2016  . Tracheostomy status ([place])   . Delirium   . Encounter for attention to tracheostomy ([place])   . History of MVR with cardiopulmonary bypass   . Systolic  congestive heart failure ([place])   . HCAP (healthcare-associated pneumonia)   . PAF (paroxysmal atrial fibrillation) ([place])   . Acute systolic congestive heart failure ([place])   . AKI (acute kidney injury) ([place])   . Abnormal LFTs   . S/P MVR (mitral valve replacement)   . Mitral regurgitation 08/23/2016  . Acute on chronic diastolic heart failure ([place]) 08/18/2016  . Acute pulmonary edema ([name])   . Respiratory failure ([place]) 08/17/2016  . Cardiogenic shock ([place]) 08/17/2016  . Acute hypoxemic respiratory failure ([place])   . STEMI (ST elevation myocardial infarction) ([place]) 08/15/2016  . Noncompliance 01/25/2015  . Bereavement 07/09/2014  . Low back pain 02/17/2014  . Cervical cancer screening 11/20/2013  . Routine general medical examination at a health care facility 10/14/2013  . Chronic kidney disease, stage 3 06/14/2012  . Hypertension 03/08/2012  . Diabetes mellitus type 2, controlled 03/08/2012  . Hyperlipidemia 03/08/2012    Past Surgical History:  Procedure Laterality Date  . AV FISTULA PLACEMENT Left 10/02/2016   Procedure: INSERTION OF ARTERIOVENOUS (AV) GORE-TEX GRAFT ARM;  Surgeon: Waynetta Sandy, MD;  Location: [place];  Service: Vascular;  Laterality: Left;  . CARDIAC CATHETERIZATION N/A 08/15/2016   Procedure: Left Heart Cath and Coronary Angiography;  Surgeon: Yolonda Kida, MD;  Location: [place] [facility];  Service: Cardiovascular;  Laterality: N/A;  . CARDIAC CATHETERIZATION N/A 08/15/2016   Procedure: Coronary Stent Intervention;  Surgeon: Yolonda Kida, MD;  Location: [place] [facility];  Service: Cardiovascular;  Laterality: N/A;  . CARDIAC CATHETERIZATION N/A 08/18/2016   Procedure: Right Heart Cath;  Surgeon: Jolaine Artist, MD;  Location: [place] [facility];  Service: Cardiovascular;  Laterality: N/A;  . CARDIAC CATHETERIZATION N/A 08/18/2016   Procedure: IABP Insertion;  Surgeon: Jolaine Artist, MD;  Location: [place] [facility];  Service:  Cardiovascular;  Laterality: N/A;  . CARDIAC CATHETERIZATION Right 08/23/2016   Procedure: CENTRAL LINE INSERTION RIGHT SUBCLAVIAN;  Surgeon: Ivin Poot, MD;  Location: [place];  Service: Open Heart Surgery;  Laterality: Right;  . ENDOVEIN HARVEST OF GREATER SAPHENOUS VEIN Left 08/23/2016   Procedure: ENDOVEIN HARVEST OF GREATER SAPHENOUS VEIN;  Surgeon: Ivin Poot, MD;  Location: [place];  Service: Open Heart Surgery;  Laterality: Left;  . INSERTION OF DIALYSIS CATHETER Bilateral 08/31/2016   Procedure: INSERTION OF DIALYSIS CATHETER LEFT INTERNAL JUGULAR VEIN & INSERTION OF TRIPLE LUMEN RIGHT INTERNAL JUGULAR VEIN;  Surgeon: Angelia Mould, MD;  Location: [facility];  Service: Vascular;  Laterality: Bilateral;  . INTRAOPERATIVE TRANSESOPHAGEAL ECHOCARDIOGRAM N/A 08/23/2016   Procedure: INTRAOPERATIVE TRANSESOPHAGEAL ECHOCARDIOGRAM;  Surgeon: Ivin Poot, MD;  Location: [place];  Service: Open Heart Surgery;  Laterality: N/A;  . IR GENERIC HISTORICAL  11/13/2016   IR US GUIDE VASC ACCESS LEFT 11/13/2016 Corrie Mckusick, DO [facility]  . IR GENERIC HISTORICAL  11/13/2016   IR FLUORO GUIDE CV LINE LEFT 11/13/2016 Corrie Mckusick, DO [facility]  .  IR GENERIC HISTORICAL  11/13/2016   IR GASTROSTOMY TUBE MOD SED 11/13/2016 Corrie Mckusick, DO [facility]  . MITRAL VALVE REPAIR N/A 08/23/2016   Procedure: MITRAL VALVE REPAIR (MVR) USING 25MM EDWARDS MAGNA EASE BIOPROSTHESIS MITRAL  VALVE;  Surgeon: Ivin Poot, MD;  Location: [facility];  Service: Open Heart Surgery;  Laterality: N/A;  . tracheostomy reversal    . TRACHEOSTOMY TUBE PLACEMENT N/A 11/09/2016   Procedure: TRACHEOSTOMY;  Surgeon: Melissa Montane, MD;  Location: [place];  Service: ENT;  Laterality: N/A;  . VAGINAL DELIVERY     x 6    Prior to Admission medications   Medication Sig Start Date End Date Taking? Authorizing Provider  albuterol (PROVENTIL) (2.5 MG/3ML) 0.083% nebulizer solution Take 3 mLs (2.5 mg total) by nebulization 4  (four) times daily - after meals and at bedtime. 11/01/16  Yes Love, Ivan Anchors, PA-C  amiodarone (PACERONE) 200 MG tablet Take 1 tablet (200 mg total) by mouth daily. 11/01/16  Yes Love, Ivan Anchors, PA-C  apixaban (ELIQUIS) 2.5 MG TABS tablet Take 2.5 mg by mouth. 06/22/17  Yes [provider]  atorvastatin (LIPITOR) 20 MG tablet Take 1 tablet (20 mg total) by mouth daily. 11/01/16  Yes Love, Ivan Anchors, PA-C  B Complex-C-Folic Acid (VOL-CARE RX PO) Take 1 tablet by mouth daily.   Yes [provider]  clopidogrel (PLAVIX) 75 MG tablet Take 1 tablet (75 mg total) by mouth daily. 11/01/16  Yes Love, Ivan Anchors, PA-C  Darbepoetin Alfa (ARANESP) 100 MCG/0.5ML SOSY injection Inject 0.5 mLs (100 mcg total) into the vein every Saturday with hemodialysis. 12/02/16  Yes Verlee Monte, MD  epoetin alfa (EPOGEN,PROCRIT) 2000 UNIT/ML injection Give in Dialysis session as directed  (04/06/17 dose = 4000unit weekly) 04/06/17  Yes [provider]  guaiFENesin (ROBITUSSIN) 100 MG/5ML SOLN Take 30 mLs (600 mg total) by mouth every 6 (six) hours. 11/01/16  Yes Love, Ivan Anchors, PA-C  hydrocortisone (ANUSOL-HC) 2.5 % rectal cream Place rectally 2 (two) times daily as needed for hemorrhoids or itching. 11/01/16  Yes Love, Ivan Anchors, PA-C  insulin glargine (LANTUS) 100 UNIT/ML injection Inject into the skin. 06/22/17 09/14/17 Yes [provider]  Melatonin 3 MG TABS Take 6 mg by mouth. 04/06/17  Yes [provider]  metoCLOPramide (REGLAN) 5 MG tablet Take 1 tablet (5 mg total) by mouth 2 (two) times daily with a meal. Patient taking differently: Take 5 mg by mouth 3 (three) times daily before meals.  11/01/16  Yes Love, Ivan Anchors, PA-C  metoprolol tartrate (LOPRESSOR) 25 MG tablet Take 12.5 mg by mouth. Take 1/2 tablet by mouth in the morning and 1/2 tablet by mouth in the evening. 06/22/17  Yes [provider]  midodrine (PROAMATINE) 10 MG tablet Take 1 tablet (10 mg total) by mouth  every morning. 11/02/16  Yes Love, Ivan Anchors, PA-C  pantoprazole (PROTONIX) 40 MG tablet Take 1 tablet (40 mg total) by mouth 2 (two) times daily. 11/01/16  Yes Love, Ivan Anchors, PA-C  polyethylene glycol (MIRALAX / GLYCOLAX) packet Take by mouth. 04/06/17  Yes [provider]  QUEtiapine (SEROQUEL) 25 MG tablet Take 25 mg by mouth. 06/11/17  Yes [provider]  senna (SENOKOT) 8.6 MG tablet Take by mouth. 04/06/17  Yes [provider]  sevelamer carbonate (RENVELA) 800 MG tablet Take 800 mg by mouth.  06/22/17 06/22/18 Yes [provider]  sodium chloride HYPERTONIC 3 % nebulizer solution Inhale 4 mL by nebulization every 6 (  six) hours. 06/22/17 06/22/18 Yes [provider]  acetaminophen (TYLENOL) 325 MG tablet Take 650 mg by mouth. 04/06/17   [provider]  ALPRAZolam Duanne Moron) 0.25 MG tablet Take 1 tablet (0.25 mg total) by mouth 2 (two) times daily as needed for anxiety. 11/01/16   Love, Ivan Anchors, PA-C  cephALEXin (KEFLEX) 500 MG capsule Take 1 capsule (500 mg total) by mouth 2 (two) times daily. 07/12/17   Hinda Kehr, MD  Insulin Detemir (LEVEMIR) 100 UNIT/ML Pen Inject 8 Units into the skin daily at 10 pm. 11/01/16   Love, Ivan Anchors, PA-C  warfarin (COUMADIN) 2 MG tablet Take 2 mg on Tue, Thu, Sat, Sun Patient not taking: Reported on 07/12/2017 11/01/16   Bary Leriche, PA-C  warfarin (COUMADIN) 4 MG tablet Take 4 mg on Mon, Wed, Fri with supper Patient not taking: Reported on 07/12/2017 11/01/16   Bary Leriche, PA-C    Allergies Patient has no known allergies.  Family History  Problem Relation Age of Onset  . Hypertension Mother   . Diabetes Mother   . Breast cancer Sister     Social History Social History  Substance Use Topics  . Smoking status: Never Smoker  . Smokeless tobacco: Never Used  . Alcohol use No    Review of Systems Constitutional: No fever/chills. generalized weakness. Eyes: No visual changes. ENT: No sore throat.   trach is functioning appropriately. Cardiovascular: Denies chest pain. Respiratory: Denies shortness of breath. Gastrointestinal: No abdominal pain.  No nausea, no vomiting.  No diarrhea.  No constipation. Genitourinary: Negative for dysuria. Musculoskeletal: Negative for neck pain.  Negative for back pain. Integumentary: Negative for rash. Neurological: dizziness/vertigo earlier today, now resolved.  No acute weakness/numbness/tingling in her extremities.   ____________________________________________   PHYSICAL EXAM:  VITAL SIGNS: ED Triage Vitals  Enc Vitals Group     BP 07/12/17 1229 131/71     Pulse Rate 07/12/17 1229 69     Resp 07/12/17 1229 18     Temp 07/12/17 1229 98.9 F (37.2 C)     Temp Source 07/12/17 1229 Oral     SpO2 07/12/17 1229 98 %     Weight 07/12/17 1230 59 kg (130 lb)     Height 07/12/17 1230 1.575 m (5\' 2" )     Head Circumference --      Peak Flow --      Pain Score 07/12/17 1228 0     Pain Loc --      Pain Edu? --      Excl. in [place]? --     Constitutional: Alert and oriented. multiple chronic medical issues but does not appear to have any acute illness nor distress at this time Eyes: Conjunctivae are normal. PERRL. EOMI. no nystagmus. Head: Atraumatic. Nose: No congestion/rhinnorhea. Mouth/Throat: Mucous membranes are moist. Neck: No stridor.  No meningeal signs.  trachin place and functioning appropiately Cardiovascular: Normal rate, regular rhythm. Good peripheral circulation. Grossly normal heart sounds. Respiratory: Normal respiratory effort.  No retractions. Lungs CTAB. Gastrointestinal: Soft and nontender. No distention.  Musculoskeletal: mild bilateral peripheral edema of her feet, not extending proximally beyond the ankles.  No redness nor erythema nor pain/tenderness Neurologic:  Normal speech and language for a patient with a trach. No gross focal neurologic deficits are appreciated; she has good grip strength bilaterally and normal bicep  flexion bilaterally and plantar flexion in bilateral legs.  No sensory deficits appreciated Skin:  Skin is warm, dry and intact. No rash noted.  Psychiatric: Mood and affect are normal. Speech and behavior are normal.  ____________________________________________   LABS (all labs ordered are listed, but only abnormal results are displayed)  Labs Reviewed  CBC WITH DIFFERENTIAL/PLATELET - Abnormal; Notable for the following:       Result Value   WBC 14.0 (*)    RDW 16.5 (*)    Neutro Abs 11.7 (*)    Lymphs Abs 0.9 (*)    Monocytes Absolute 1.2 (*)    All other components within normal limits  COMPREHENSIVE METABOLIC PANEL - Abnormal; Notable for the following:    Glucose, Bld 205 (*)    BUN 46 (*)    Creatinine, Ser 7.09 (*)    Alkaline Phosphatase 166 (*)    GFR calc non Af Amer 6 (*)    GFR calc Af Amer 7 (*)    All other components within normal limits  TROPONIN I - Abnormal; Notable for the following:    Troponin I 0.03 (*)    All other components within normal limits  URINALYSIS, ROUTINE W REFLEX MICROSCOPIC - Abnormal; Notable for the following:    Color, Urine YELLOW (*)    APPearance CLOUDY (*)    Hgb urine dipstick SMALL (*)    Protein, ur 100 (*)    Leukocytes, UA SMALL (*)    Bacteria, UA RARE (*)    Squamous Epithelial / LPF 6-30 (*)    All other components within normal limits  URINE CULTURE   ____________________________________________  EKG  ED ECG REPORT I, Ziare Cryder, the attending physician, personally viewed and interpreted this ECG.  Date: 07/12/2017 EKG Time: 12:34 Rate: 71 Rhythm: normal sinus rhythm QRS Axis: borderline LAD Intervals: normal ST/T Wave abnormalities: Non-specific ST segment / T-wave changes, but no evidence of acute ischemia. Narrative Interpretation: unremarkable  ____________________________________________  RADIOLOGY   No results found.  ____________________________________________   PROCEDURES  Critical  Care performed: No   Procedure(s) performed:   Procedures   ____________________________________________   INITIAL IMPRESSION / ASSESSMENT AND PLAN / ED COURSE  Pertinent labs & imaging results that were available during my care of the patient were reviewed by me and considered in my medical decision making (see chart for details).  the patient is well-appearing and in no acute distress at this time.  No obvious explanation for her generalized weakness.  She has a very mild leukocytosis of 14 but has no respiratory symptoms.  Her vital signs are within normal limits.  Her EKG is unremarkable.  Her electrolytes are all within normal limits in spite of missing dialysis today.  Since she does continue to produce urine we will check a urinalysis as she may be suffering from a urinary tract infection although she has no dysuria at this time. She has no acute neurological deficits although she did have the episode of dizziness earlier today.  Her signs and symptoms are not consistent with TIA/CVA, but I will discuss additional evaluation with her to see if she wants to pursue further workup.  Clinical Course as of Jul 12 1622  Thu Jul 12, 2017  1451 I had a discussion with the patient about whether or not to obtain any imaging of her head, specifically an MR brain to rule out CVA.  I explained that I found the possibility of CVA to be very unlikely given that her symptoms of vertigo had completely resolved and she has no acute neurological deficits at this time.  Even if she did have a TIA  would not show up on MR.  However I did offer an MRI to her if she felt a severe enough degree of concern over what she experienced previously.  She declined the imaging given that her dizziness has resolved and in my opinion has the capacity to make her own decisions.  I think this is appropriate.  Her urinalysis indicates a probable UTI.  Admittedly this is difficult to interpret in the setting of end-stage renal  disease on hemodialysis, but she does urinate several times a day and an acute or subacute infection could be triggering to both her mild leukocytosis and her generalized weakness  I will treat her with ceftriaxone 1 g IV in the emergency department and a course of Keflex as an outpatient.  I verified in UpToDate that the manufacturer does not recommend dosage adjustment for ESRD on HD.  [CF]    Clinical Course User Index [CF] Hinda Kehr, MD    ____________________________________________  FINAL CLINICAL IMPRESSION(S) / ED DIAGNOSES  Final diagnoses:  Generalized weakness  Urinary tract infection without hematuria, site unspecified  ESRD on hemodialysis ([place])     MEDICATIONS GIVEN DURING THIS VISIT:  Medications  albuterol (PROVENTIL) (2.5 MG/3ML) 0.083% nebulizer solution 2.5 mg (not administered)  cefTRIAXone (ROCEPHIN) 1 g in dextrose 5 % 50 mL IVPB (1 g Intravenous Restarted 07/12/17 1508)     NEW OUTPATIENT MEDICATIONS STARTED DURING THIS VISIT:  New Prescriptions   CEPHALEXIN (KEFLEX) 500 MG CAPSULE    Take 1 capsule (500 mg total) by mouth 2 (two) times daily.    Modified Medications   No medications on file    Discontinued Medications   No medications on file     Note:  This document was prepared using Dragon voice recognition software and may include unintentional dictation errors.    Hinda Kehr, MD 07/12/17 [phone]

## 2017-07-12 NOTE — ED Notes (Signed)
Iv inserted by val and labs obtained and sent

## 2017-07-12 NOTE — ED Notes (Signed)
Pt eating lunch tray  

## 2017-07-12 NOTE — Discharge Instructions (Signed)
As we discussed, your workup today was generally reassuring.  It appears you have a urinary tract infection which may be the cause of your generalized weakness.  We gave you a dose of antibiotics in the Emergency Department and wrote a prescription for you to take antibiotics as an outpatient.  Please follow up with your regular doctor at the next available opportuntiy and continue with dialysis as scheduled.  Please return immediately to the Emergency Department if you develop any new or worsening symptoms that concern you.

## 2017-07-14 LAB — URINE CULTURE: Special Requests: NORMAL

## 2017-07-19 ENCOUNTER — Other Ambulatory Visit: Payer: Self-pay

## 2017-07-20 ENCOUNTER — Encounter: Payer: Self-pay | Admitting: Podiatry

## 2017-07-20 ENCOUNTER — Ambulatory Visit (INDEPENDENT_AMBULATORY_CARE_PROVIDER_SITE_OTHER): Payer: Medicaid Other | Admitting: Podiatry

## 2017-07-20 DIAGNOSIS — B351 Tinea unguium: Secondary | ICD-10-CM | POA: Diagnosis not present

## 2017-07-20 DIAGNOSIS — E0842 Diabetes mellitus due to underlying condition with diabetic polyneuropathy: Secondary | ICD-10-CM | POA: Diagnosis not present

## 2017-07-20 DIAGNOSIS — M79676 Pain in unspecified toe(s): Secondary | ICD-10-CM | POA: Diagnosis not present

## 2017-07-20 MED ORDER — PREGABALIN 100 MG PO CAPS: 100.0000 mg | ORAL_CAPSULE | Freq: Two times a day (BID) | ORAL | 2 refills | Status: DC

## 2017-07-20 MED ORDER — NONFORMULARY OR COMPOUNDED ITEM: 2 refills | Status: DC

## 2017-07-20 NOTE — Progress Notes (Signed)
   Subjective:    Patient ID: Deborah [name], female    DOB: 1956-05-16, 61 y.o.   MRN: [id number]  HPI  Chief Complaint  Patient presents with  . Nail Problem    [place]/Diabetic   . Diabetes    Type 2 diabetic - last a1c - 6.1        Review of Systems  Respiratory: Positive for apnea and shortness of breath.   Endocrine: Positive for polyuria.  All other systems reviewed and are negative.      Objective:   Physical Exam        Assessment & Plan:

## 2017-07-21 NOTE — Progress Notes (Signed)
Patient ID: Deborah [name], female   DOB: February 22, 1956, 61 y.o.   MRN: [id number]   SUBJECTIVE Patient with a history of diabetes mellitus presents to office today complaining of elongated, thickened nails. Pain while ambulating in shoes. Patient is unable to trim their own nails.   OBJECTIVE General Patient is awake, alert, and oriented x 3 and in no acute distress. Derm Skin is dry and supple bilateral. Negative open lesions or macerations. Remaining integument unremarkable. Nails are tender, long, thickened and dystrophic with subungual debris, consistent with onychomycosis, 1-5 bilateral. No signs of infection noted. Vasc  DP and PT pedal pulses palpable bilaterally. Temperature gradient within normal limits.  Neuro Epicritic and protective threshold sensation diminished bilaterally.  Musculoskeletal Exam No symptomatic pedal deformities noted bilateral. Muscular strength within normal limits.  ASSESSMENT 1. Diabetes Mellitus w/ peripheral neuropathy 2. Onychomycosis of nail due to dermatophyte bilateral 3. Pain in foot bilateral  PLAN OF CARE 1. Patient evaluated today. 2. Instructed to maintain good pedal hygiene and foot care. Stressed importance of controlling blood sugar.  3. Mechanical debridement of nails 1-5 bilaterally performed using a nail nipper. Filed with dremel without incident.  4. Prescription for Lyrica 100 mg twice a day  5. Prescription for peripheral neuropathy cream dispensed through [facility]  6. Return to clinic in 3 mos.     Edrick Kins, DPM [facility]  Dr. Edrick Kins, [place]                                        [place], [place] [address]                Office [phone]  Fax [phone]

## 2017-07-24 ENCOUNTER — Emergency Department: Payer: Medicaid Other

## 2017-07-24 ENCOUNTER — Encounter: Payer: Self-pay | Admitting: Emergency Medicine

## 2017-07-24 ENCOUNTER — Emergency Department
Admission: EM | Admit: 2017-07-24 | Discharge: 2017-07-24 | Disposition: A | Discharge status: Home / Self Care | Payer: Medicaid Other | Attending: Emergency Medicine | Admitting: Emergency Medicine

## 2017-07-24 DIAGNOSIS — N186 End stage renal disease: Secondary | ICD-10-CM | POA: Insufficient documentation

## 2017-07-24 DIAGNOSIS — I120 Hypertensive chronic kidney disease with stage 5 chronic kidney disease or end stage renal disease: Secondary | ICD-10-CM | POA: Diagnosis not present

## 2017-07-24 DIAGNOSIS — R509 Fever, unspecified: Secondary | ICD-10-CM | POA: Diagnosis present

## 2017-07-24 DIAGNOSIS — R0602 Shortness of breath: Secondary | ICD-10-CM | POA: Insufficient documentation

## 2017-07-24 DIAGNOSIS — E119 Type 2 diabetes mellitus without complications: Secondary | ICD-10-CM | POA: Diagnosis not present

## 2017-07-24 DIAGNOSIS — R059 Cough, unspecified: Secondary | ICD-10-CM

## 2017-07-24 DIAGNOSIS — R05 Cough: Secondary | ICD-10-CM | POA: Diagnosis not present

## 2017-07-24 DIAGNOSIS — Z992 Dependence on renal dialysis: Secondary | ICD-10-CM | POA: Diagnosis not present

## 2017-07-24 LAB — URINALYSIS, COMPLETE (UACMP) WITH MICROSCOPIC
BILIRUBIN URINE: NEGATIVE
Bacteria, UA: NONE SEEN
GLUCOSE, UA: 50 mg/dL — AB
Hgb urine dipstick: NEGATIVE
KETONES UR: NEGATIVE mg/dL
LEUKOCYTES UA: NEGATIVE
Nitrite: NEGATIVE
PH: 8 (ref 5.0–8.0)
Protein, ur: 100 mg/dL — AB
RBC / HPF: NONE SEEN RBC/hpf (ref 0–5)
Specific Gravity, Urine: 1.01 (ref 1.005–1.030)

## 2017-07-24 LAB — BASIC METABOLIC PANEL
Anion gap: 9 (ref 5–15)
BUN: 28 mg/dL — ABNORMAL HIGH (ref 6–20)
CHLORIDE: 105 mmol/L (ref 101–111)
CO2: 26 mmol/L (ref 22–32)
CREATININE: 6.08 mg/dL — AB (ref 0.44–1.00)
Calcium: 9.4 mg/dL (ref 8.9–10.3)
GFR calc non Af Amer: 7 mL/min — ABNORMAL LOW (ref >60)
GFR, EST AFRICAN AMERICAN: 8 mL/min — AB (ref >60)
GLUCOSE: 218 mg/dL — AB (ref 65–99)
Potassium: 4.3 mmol/L (ref 3.5–5.1)
Sodium: 140 mmol/L (ref 135–145)

## 2017-07-24 LAB — CBC WITH DIFFERENTIAL/PLATELET
BASOS PCT: 0 %
Basophils Absolute: 0.1 10*3/uL (ref 0–0.1)
Eosinophils Absolute: 0.3 10*3/uL (ref 0–0.7)
Eosinophils Relative: 2 %
HEMATOCRIT: 35.9 % (ref 35.0–47.0)
HEMOGLOBIN: 11.7 g/dL — AB (ref 12.0–16.0)
LYMPHS ABS: 1.4 10*3/uL (ref 1.0–3.6)
LYMPHS PCT: 12 %
MCH: 27.2 pg (ref 26.0–34.0)
MCHC: 32.5 g/dL (ref 32.0–36.0)
MCV: 83.8 fL (ref 80.0–100.0)
MONO ABS: 0.9 10*3/uL (ref 0.2–0.9)
MONOS PCT: 8 %
NEUTROS ABS: 9.8 10*3/uL — AB (ref 1.4–6.5)
NEUTROS PCT: 78 %
Platelets: 262 10*3/uL (ref 150–440)
RBC: 4.28 MIL/uL (ref 3.80–5.20)
RDW: 17.5 % — ABNORMAL HIGH (ref 11.5–14.5)
WBC: 12.5 10*3/uL — ABNORMAL HIGH (ref 3.6–11.0)

## 2017-07-24 LAB — TROPONIN I: TROPONIN I: 0.03 ng/mL — AB (ref <0.03)

## 2017-07-24 LAB — BRAIN NATRIURETIC PEPTIDE: B Natriuretic Peptide: 868 pg/mL — ABNORMAL HIGH (ref 0.0–100.0)

## 2017-07-24 LAB — LACTIC ACID, PLASMA: Lactic Acid, Venous: 1.3 mmol/L (ref 0.5–1.9)

## 2017-07-24 MED ORDER — LEVOFLOXACIN 750 MG PO TABS
750.0000 mg | ORAL_TABLET | Freq: Once | ORAL | Status: AC
  Administered 2017-07-24: 750 mg via ORAL
  Filled 2017-07-24: qty 1

## 2017-07-24 NOTE — ED Notes (Signed)
Pt repports having a fever this morning of 100.5, pt has a long term trach, home health nurse reports fever was 99.5 yesterday, pt denies pain or any other symptoms

## 2017-07-24 NOTE — ED Triage Notes (Signed)
Pt to ed with c/o sob, decreased sats at home and fever since yesterday. Last dialysis was Saturday. Pt reports nausea.

## 2017-07-24 NOTE — ED Notes (Signed)
Date and time results received: 07/24/17  (use smartphrase ".now" to insert current time)  Test: troponin Critical Value: 0.03  Name of Provider Notified: Dr Jimmye Norman  Orders Received? Or Actions Taken?:

## 2017-07-24 NOTE — ED Provider Notes (Signed)
[facility] Emergency Department Provider Note       Time seen: ----------------------------------------- 9:37 AM on 07/24/2017 -----------------------------------------     I have reviewed the triage vital signs and the nursing notes.   HISTORY   Chief Complaint No chief complaint on file.    HPI Deborah [name] is a 61 y.o. female who presents to the ED for fever and cough with some sputum production and shortness of breath. Reportedly she has borderline low oxygen saturations. Recently she was treated for UTI and had been having some dizziness but she feels like this had improved. Her last dialysis was on Saturday and she is due for dialysis today. Nothing makes her symptoms better.    Past Medical History:  Diagnosis Date  . Diabetes mellitus   . Hyperlipidemia   . Hypertension   . MI, old   . Renal disorder    dialysls    Patient Active Problem List   Diagnosis Date Noted  . Adjustment disorder with anxious mood 12/09/2016  . Bacteremia due to Staphylococcus aureus   . Staphylococcus aureus bacteremia with sepsis ([place])   . Uncontrolled type 2 diabetes mellitus with complication ([place])   . ST elevation myocardial infarction involving left main coronary artery ([place])   . Palliative care encounter   . Advance care planning   . Vocal cord dysfunction   . Palliative care by specialist   . Goals of care, counseling/discussion   . Bacteremia   . End-stage renal disease on hemodialysis ([place])   . Paroxysmal atrial fibrillation ([name])   . Heart attack ([place])   . Uncontrolled type 2 diabetes mellitus with complication, without long-term current use of insulin ([place])   . Staphylococcus aureus bacteremia 11/12/2016  . Vocal cord paralysis   . Respiratory distress 11/02/2016  . Chronic anticoagulation   . Acute lower UTI   . Labile blood glucose   . Leukocytosis   . Subtherapeutic international normalized ratio (INR)   . Supratherapeutic INR   .  Cough   . Oropharyngeal dysphagia   . Uncontrolled type 2 diabetes mellitus with complication, with long-term current use of insulin ([place])   . Physical debility 10/11/2016  . Metabolic encephalopathy 20/94/7096  . Debility   . ESRD on dialysis ([place])   . Type 2 diabetes mellitus with complication, with long-term current use of insulin ([place])   . Anemia of chronic disease   . Coronary artery disease involving native coronary artery of native heart without angina pectoris   . Acute on chronic respiratory failure with hypoxemia ([place])   . Atrial flutter ([place])   . Persistent cough   . Dysphonia   . Sleep disturbance   . Diabetes mellitus type 2 in nonobese ([name])   . Stage 3 chronic kidney disease   . NSTEMI (non-ST elevated myocardial infarction) ([place])   . Tachypnea   . Hyponatremia   . Lymphocytosis   . Acute blood loss anemia   . Pressure injury of skin 09/18/2016  . History of tracheostomy   . Delirium   . Encounter for attention to tracheostomy ([place])   . History of MVR with cardiopulmonary bypass   . Systolic congestive heart failure ([place])   . HCAP (healthcare-associated pneumonia)   . PAF (paroxysmal atrial fibrillation) ([place])   . Acute systolic congestive heart failure ([place])   . AKI (acute kidney injury) ([place])   . Abnormal LFTs   . S/P MVR (mitral valve replacement)   . Mitral regurgitation 08/23/2016  .  Acute on chronic diastolic heart failure ([place]) 08/18/2016  . Acute pulmonary edema ([name])   . Respiratory failure ([place]) 08/17/2016  . Cardiogenic shock ([place]) 08/17/2016  . Acute hypoxemic respiratory failure ([place])   . STEMI (ST elevation myocardial infarction) ([place]) 08/15/2016  . Noncompliance 01/25/2015  . Bereavement 07/09/2014  . Low back pain 02/17/2014  . Cervical cancer screening 11/20/2013  . Routine general medical examination at a health care facility 10/14/2013  . Chronic kidney disease, stage 3 06/14/2012  . Hypertension 03/08/2012  . Diabetes mellitus type 2,  controlled 03/08/2012  . Hyperlipidemia 03/08/2012    Past Surgical History:  Procedure Laterality Date  . AV FISTULA PLACEMENT Left 10/02/2016   Procedure: INSERTION OF ARTERIOVENOUS (AV) GORE-TEX GRAFT ARM;  Surgeon: Waynetta Sandy, MD;  Location: [facility];  Service: Vascular;  Laterality: Left;  . CARDIAC CATHETERIZATION N/A 08/15/2016   Procedure: Left Heart Cath and Coronary Angiography;  Surgeon: Yolonda Kida, MD;  Location: [place];  Service: Cardiovascular;  Laterality: N/A;  . CARDIAC CATHETERIZATION N/A 08/15/2016   Procedure: Coronary Stent Intervention;  Surgeon: Yolonda Kida, MD;  Location: [place] [facility];  Service: Cardiovascular;  Laterality: N/A;  . CARDIAC CATHETERIZATION N/A 08/18/2016   Procedure: Right Heart Cath;  Surgeon: Jolaine Artist, MD;  Location: [facility];  Service: Cardiovascular;  Laterality: N/A;  . CARDIAC CATHETERIZATION N/A 08/18/2016   Procedure: IABP Insertion;  Surgeon: Jolaine Artist, MD;  Location: [place] [facility];  Service: Cardiovascular;  Laterality: N/A;  . CARDIAC CATHETERIZATION Right 08/23/2016   Procedure: CENTRAL LINE INSERTION RIGHT SUBCLAVIAN;  Surgeon: Ivin Poot, MD;  Location: [facility];  Service: Open Heart Surgery;  Laterality: Right;  . ENDOVEIN HARVEST OF GREATER SAPHENOUS VEIN Left 08/23/2016   Procedure: ENDOVEIN HARVEST OF GREATER SAPHENOUS VEIN;  Surgeon: Ivin Poot, MD;  Location: [place];  Service: Open Heart Surgery;  Laterality: Left;  . INSERTION OF DIALYSIS CATHETER Bilateral 08/31/2016   Procedure: INSERTION OF DIALYSIS CATHETER LEFT INTERNAL JUGULAR VEIN & INSERTION OF TRIPLE LUMEN RIGHT INTERNAL JUGULAR VEIN;  Surgeon: Angelia Mould, MD;  Location: [facility];  Service: Vascular;  Laterality: Bilateral;  . INTRAOPERATIVE TRANSESOPHAGEAL ECHOCARDIOGRAM N/A 08/23/2016   Procedure: INTRAOPERATIVE TRANSESOPHAGEAL ECHOCARDIOGRAM;  Surgeon: Ivin Poot, MD;  Location: [facility];  Service: Open Heart Surgery;  Laterality: N/A;  . IR GENERIC HISTORICAL  11/13/2016   IR US GUIDE VASC ACCESS LEFT 11/13/2016 Corrie Mckusick, DO [facility]  . IR GENERIC HISTORICAL  11/13/2016   IR FLUORO GUIDE CV LINE LEFT 11/13/2016 Corrie Mckusick, DO [facility]  . IR GENERIC HISTORICAL  11/13/2016   IR GASTROSTOMY TUBE MOD SED 11/13/2016 Corrie Mckusick, DO [facility]  . MITRAL VALVE REPAIR N/A 08/23/2016   Procedure: MITRAL VALVE REPAIR (MVR) USING 25MM EDWARDS MAGNA EASE BIOPROSTHESIS MITRAL  VALVE;  Surgeon: Ivin Poot, MD;  Location: [facility];  Service: Open Heart Surgery;  Laterality: N/A;  . tracheostomy reversal    . TRACHEOSTOMY TUBE PLACEMENT N/A 11/09/2016   Procedure: TRACHEOSTOMY;  Surgeon: Melissa Montane, MD;  Location: [facility];  Service: ENT;  Laterality: N/A;  . VAGINAL DELIVERY     x 6    Allergies Patient has no known allergies.  Social History Social History  Substance Use Topics  . Smoking status: Never Smoker  . Smokeless tobacco: Never Used  . Alcohol use No    Review of Systems Constitutional: Positive for  fever Eyes: Negative for vision changes ENT:  Negative for congestion, sore throat Cardiovascular: Negative for chest pain. Respiratory: Positive for cough, shortness of breath Gastrointestinal: Negative for abdominal pain, positive for nausea Genitourinary: Negative for dysuria. Musculoskeletal: Negative for back pain. Skin: Negative for rash. Neurological: Negative for headaches, focal weakness or numbness. Positive for dizziness  All systems negative/normal/unremarkable except as stated in the HPI  ____________________________________________   PHYSICAL EXAM:  VITAL SIGNS: ED Triage Vitals  Enc Vitals Group     BP      Pulse      Resp      Temp      Temp src      SpO2      Weight      Height      Head Circumference      Peak Flow      Pain Score      Pain Loc      Pain Edu?      Excl. in [place]?     Constitutional: Alert and  oriented. Well appearing and in no distress. Eyes: Conjunctivae are normal. Normal extraocular movements. ENT   Head: Normocephalic and atraumatic.   Nose: No congestion/rhinnorhea.   Mouth/Throat: Mucous membranes are moist.   Neck: No stridor.No obvious secretions from the trach Cardiovascular: Normal rate, regular rhythm. No murmurs, rubs, or gallops. Respiratory: Normal respiratory effort without tachypnea nor retractions. Breath sounds are clear and equal bilaterally. No wheezes/rales/rhonchi. Gastrointestinal: Soft and nontender. Normal bowel sounds Musculoskeletal: Nontender with normal range of motion in extremities. No lower extremity tenderness nor edema. Left upper arm AV fistula with thrill and bruit Neurologic:  Normal speech and language. No gross focal neurologic deficits are appreciated.  Skin:  Skin is warm, dry and intact. No rash noted. Psychiatric: Mood and affect are normal. Speech and behavior are normal.  ____________________________________________  EKG: Interpreted by me. Sinus rhythm with a rate of 86 bpm, prolonged PR interval, normal QRS, long QT.  ____________________________________________  ED COURSE:  Pertinent labs & imaging results that were available during my care of the patient were reviewed by me and considered in my medical decision making (see chart for details). Patient presents for fever and cough, we will assess with labs and imaging as indicated.   Procedures ____________________________________________   LABS (pertinent positives/negatives)  Labs Reviewed  CBC WITH DIFFERENTIAL/PLATELET - Abnormal; Notable for the following:       Result Value   WBC 12.5 (*)    Hemoglobin 11.7 (*)    RDW 17.5 (*)    Neutro Abs 9.8 (*)    All other components within normal limits  CULTURE, BLOOD (ROUTINE X 2)  CULTURE, BLOOD (ROUTINE X 2)  BASIC METABOLIC PANEL  BRAIN NATRIURETIC PEPTIDE  TROPONIN I  URINALYSIS, COMPLETE (UACMP) WITH  MICROSCOPIC  LACTIC ACID, PLASMA    RADIOLOGY Images were viewed by me  Chest x-ray IMPRESSION: Cardiomegaly with vascular congestion and mild interstitial prominence, possibly early interstitial edema.  Suspect small bilateral effusions.  Bibasilar atelectasis. ____________________________________________  FINAL ASSESSMENT AND PLAN  Cough, end-stage renal disease on dialysis  Plan: Patient's labs and imaging were dictated above. Patient had presented for borderline fever with cough. She does appear to need dialysis today and has agreed to go at this time. Currently her vital signs are stable and we have not found any source for the borderline temperature. I did give her a dose of Levaquin here to cover for any occult infection but currently  otherwise her workup is reassuring.   Earleen Newport, MD   Note: This note was generated in part or whole with voice recognition software. Voice recognition is usually quite accurate but there are transcription errors that can and very often do occur. I apologize for any typographical errors that were not detected and corrected.     Earleen Newport, MD 07/24/17 1245

## 2017-07-29 LAB — CULTURE, BLOOD (ROUTINE X 2)
CULTURE: NO GROWTH
CULTURE: NO GROWTH
Special Requests: ADEQUATE
Special Requests: ADEQUATE

## 2017-08-13 ENCOUNTER — Telehealth: Payer: Self-pay

## 2017-08-13 MED ORDER — GABAPENTIN 100 MG PO CAPS: 100.0000 mg | ORAL_CAPSULE | Freq: Two times a day (BID) | ORAL | 1 refills | Status: DC

## 2017-08-13 NOTE — Telephone Encounter (Signed)
Medicaid will not cover Lyrica, Per Dr. Amalia Hailey, ok to call in Gabapentin 100mg .  Script has been called to pharmacy.

## 2017-09-08 ENCOUNTER — Inpatient Hospital Stay
Admission: EM | Admit: 2017-09-08 | Discharge: 2017-09-10 | DRG: 871 | Disposition: A | Discharge status: Home / Self Care | Payer: Medicaid Other | Attending: Internal Medicine | Admitting: Internal Medicine

## 2017-09-08 ENCOUNTER — Inpatient Hospital Stay: Admit: 2017-09-08 | Payer: Medicaid Other

## 2017-09-08 ENCOUNTER — Emergency Department: Payer: Medicaid Other

## 2017-09-08 ENCOUNTER — Encounter: Payer: Self-pay | Admitting: Emergency Medicine

## 2017-09-08 DIAGNOSIS — E871 Hypo-osmolality and hyponatremia: Secondary | ICD-10-CM | POA: Diagnosis present

## 2017-09-08 DIAGNOSIS — E1143 Type 2 diabetes mellitus with diabetic autonomic (poly)neuropathy: Secondary | ICD-10-CM | POA: Diagnosis present

## 2017-09-08 DIAGNOSIS — E785 Hyperlipidemia, unspecified: Secondary | ICD-10-CM | POA: Diagnosis present

## 2017-09-08 DIAGNOSIS — Z803 Family history of malignant neoplasm of breast: Secondary | ICD-10-CM

## 2017-09-08 DIAGNOSIS — I252 Old myocardial infarction: Secondary | ICD-10-CM | POA: Diagnosis not present

## 2017-09-08 DIAGNOSIS — Z992 Dependence on renal dialysis: Secondary | ICD-10-CM

## 2017-09-08 DIAGNOSIS — Z833 Family history of diabetes mellitus: Secondary | ICD-10-CM | POA: Diagnosis not present

## 2017-09-08 DIAGNOSIS — E1165 Type 2 diabetes mellitus with hyperglycemia: Secondary | ICD-10-CM | POA: Diagnosis present

## 2017-09-08 DIAGNOSIS — Z930 Tracheostomy status: Secondary | ICD-10-CM | POA: Diagnosis not present

## 2017-09-08 DIAGNOSIS — J9621 Acute and chronic respiratory failure with hypoxia: Secondary | ICD-10-CM | POA: Diagnosis present

## 2017-09-08 DIAGNOSIS — I132 Hypertensive heart and chronic kidney disease with heart failure and with stage 5 chronic kidney disease, or end stage renal disease: Secondary | ICD-10-CM | POA: Diagnosis present

## 2017-09-08 DIAGNOSIS — A419 Sepsis, unspecified organism: Secondary | ICD-10-CM | POA: Diagnosis present

## 2017-09-08 DIAGNOSIS — Z794 Long term (current) use of insulin: Secondary | ICD-10-CM

## 2017-09-08 DIAGNOSIS — D631 Anemia in chronic kidney disease: Secondary | ICD-10-CM | POA: Diagnosis present

## 2017-09-08 DIAGNOSIS — R05 Cough: Secondary | ICD-10-CM | POA: Diagnosis present

## 2017-09-08 DIAGNOSIS — R509 Fever, unspecified: Secondary | ICD-10-CM

## 2017-09-08 DIAGNOSIS — Y95 Nosocomial condition: Secondary | ICD-10-CM | POA: Diagnosis present

## 2017-09-08 DIAGNOSIS — I2510 Atherosclerotic heart disease of native coronary artery without angina pectoris: Secondary | ICD-10-CM | POA: Diagnosis present

## 2017-09-08 DIAGNOSIS — I5033 Acute on chronic diastolic (congestive) heart failure: Secondary | ICD-10-CM | POA: Diagnosis present

## 2017-09-08 DIAGNOSIS — Z8249 Family history of ischemic heart disease and other diseases of the circulatory system: Secondary | ICD-10-CM

## 2017-09-08 DIAGNOSIS — N186 End stage renal disease: Secondary | ICD-10-CM | POA: Diagnosis present

## 2017-09-08 DIAGNOSIS — K3184 Gastroparesis: Secondary | ICD-10-CM | POA: Diagnosis present

## 2017-09-08 DIAGNOSIS — E1122 Type 2 diabetes mellitus with diabetic chronic kidney disease: Secondary | ICD-10-CM | POA: Diagnosis present

## 2017-09-08 DIAGNOSIS — N2581 Secondary hyperparathyroidism of renal origin: Secondary | ICD-10-CM | POA: Diagnosis present

## 2017-09-08 DIAGNOSIS — R0602 Shortness of breath: Secondary | ICD-10-CM

## 2017-09-08 DIAGNOSIS — Z7901 Long term (current) use of anticoagulants: Secondary | ICD-10-CM

## 2017-09-08 DIAGNOSIS — I4891 Unspecified atrial fibrillation: Secondary | ICD-10-CM | POA: Diagnosis present

## 2017-09-08 DIAGNOSIS — J189 Pneumonia, unspecified organism: Secondary | ICD-10-CM | POA: Diagnosis present

## 2017-09-08 DIAGNOSIS — I361 Nonrheumatic tricuspid (valve) insufficiency: Secondary | ICD-10-CM | POA: Diagnosis not present

## 2017-09-08 DIAGNOSIS — R059 Cough, unspecified: Secondary | ICD-10-CM

## 2017-09-08 LAB — COMPREHENSIVE METABOLIC PANEL
ALK PHOS: 117 U/L (ref 38–126)
ALT: 33 U/L (ref 14–54)
AST: 22 U/L (ref 15–41)
Albumin: 3.8 g/dL (ref 3.5–5.0)
Anion gap: 12 (ref 5–15)
BILIRUBIN TOTAL: 1.8 mg/dL — AB (ref 0.3–1.2)
BUN: 19 mg/dL (ref 6–20)
CHLORIDE: 98 mmol/L — AB (ref 101–111)
CO2: 24 mmol/L (ref 22–32)
CREATININE: 3.14 mg/dL — AB (ref 0.44–1.00)
Calcium: 8.5 mg/dL — ABNORMAL LOW (ref 8.9–10.3)
GFR calc non Af Amer: 15 mL/min — ABNORMAL LOW (ref >60)
GFR, EST AFRICAN AMERICAN: 17 mL/min — AB (ref >60)
GLUCOSE: 288 mg/dL — AB (ref 65–99)
POTASSIUM: 3.7 mmol/L (ref 3.5–5.1)
Sodium: 134 mmol/L — ABNORMAL LOW (ref 135–145)
Total Protein: 7 g/dL (ref 6.5–8.1)

## 2017-09-08 LAB — MRSA PCR SCREENING: MRSA by PCR: NEGATIVE

## 2017-09-08 LAB — CBC WITH DIFFERENTIAL/PLATELET
Basophils Absolute: 0.1 10*3/uL (ref 0–0.1)
Basophils Relative: 0 %
Eosinophils Absolute: 0.1 10*3/uL (ref 0–0.7)
Eosinophils Relative: 1 %
HEMATOCRIT: 29.9 % — AB (ref 35.0–47.0)
Hemoglobin: 9.9 g/dL — ABNORMAL LOW (ref 12.0–16.0)
LYMPHS PCT: 4 %
Lymphs Abs: 0.7 10*3/uL — ABNORMAL LOW (ref 1.0–3.6)
MCH: 28 pg (ref 26.0–34.0)
MCHC: 33.1 g/dL (ref 32.0–36.0)
MCV: 84.7 fL (ref 80.0–100.0)
MONO ABS: 1.5 10*3/uL — AB (ref 0.2–0.9)
MONOS PCT: 9 %
NEUTROS ABS: 13.9 10*3/uL — AB (ref 1.4–6.5)
Neutrophils Relative %: 86 %
Platelets: 277 10*3/uL (ref 150–440)
RBC: 3.53 MIL/uL — ABNORMAL LOW (ref 3.80–5.20)
RDW: 20.3 % — AB (ref 11.5–14.5)
WBC: 16.2 10*3/uL — ABNORMAL HIGH (ref 3.6–11.0)

## 2017-09-08 LAB — HEMOGLOBIN A1C
Hgb A1c MFr Bld: 8.4 % — ABNORMAL HIGH (ref 4.8–5.6)
Mean Plasma Glucose: 194.38 mg/dL

## 2017-09-08 LAB — GLUCOSE, CAPILLARY
Glucose-Capillary: 199 mg/dL — ABNORMAL HIGH (ref 65–99)
Glucose-Capillary: 204 mg/dL — ABNORMAL HIGH (ref 65–99)
Glucose-Capillary: 120 mg/dL — ABNORMAL HIGH (ref 65–99)
Glucose-Capillary: 237 mg/dL — ABNORMAL HIGH (ref 65–99)

## 2017-09-08 LAB — PROCALCITONIN: Procalcitonin: 0.17 ng/mL

## 2017-09-08 LAB — LACTIC ACID, PLASMA: Lactic Acid, Venous: 1 mmol/L (ref 0.5–1.9)

## 2017-09-08 LAB — TSH: TSH: 1.518 u[IU]/mL (ref 0.350–4.500)

## 2017-09-08 LAB — PHOSPHORUS: PHOSPHORUS: 3.2 mg/dL (ref 2.5–4.6)

## 2017-09-08 MED ORDER — VANCOMYCIN HCL 500 MG IV SOLR
500.0000 mg | INTRAVENOUS | Status: DC | PRN
  Filled 2017-09-08: qty 500

## 2017-09-08 MED ORDER — MELATONIN 3 MG PO TABS: 6.0000 mg | ORAL_TABLET | Freq: Every day | ORAL | Status: DC

## 2017-09-08 MED ORDER — DEXTROSE 5 % IV SOLN
1.0000 g | INTRAVENOUS | Status: DC
  Administered 2017-09-09: 1 g via INTRAVENOUS
  Filled 2017-09-08 (×2): qty 1

## 2017-09-08 MED ORDER — GABAPENTIN 100 MG PO CAPS
100.0000 mg | ORAL_CAPSULE | Freq: Two times a day (BID) | ORAL | Status: DC
  Administered 2017-09-08 – 2017-09-10 (×5): 100 mg via ORAL
  Filled 2017-09-08 (×5): qty 1

## 2017-09-08 MED ORDER — ACETAMINOPHEN 650 MG RE SUPP: 650.0000 mg | Freq: Four times a day (QID) | RECTAL | Status: DC | PRN

## 2017-09-08 MED ORDER — VANCOMYCIN HCL IN DEXTROSE 1-5 GM/200ML-% IV SOLN: 1000.0000 mg | Freq: Once | INTRAVENOUS | Status: DC

## 2017-09-08 MED ORDER — FUROSEMIDE 10 MG/ML IJ SOLN
40.0000 mg | INTRAMUSCULAR | Status: AC
  Administered 2017-09-08: 40 mg via INTRAVENOUS
  Filled 2017-09-08: qty 4

## 2017-09-08 MED ORDER — ONDANSETRON HCL 4 MG/2ML IJ SOLN: 4.0000 mg | Freq: Four times a day (QID) | INTRAMUSCULAR | Status: DC | PRN

## 2017-09-08 MED ORDER — ALBUTEROL SULFATE (2.5 MG/3ML) 0.083% IN NEBU
2.5000 mg | INHALATION_SOLUTION | Freq: Three times a day (TID) | RESPIRATORY_TRACT | Status: DC
  Administered 2017-09-08 – 2017-09-10 (×7): 2.5 mg via RESPIRATORY_TRACT
  Filled 2017-09-08 (×8): qty 3

## 2017-09-08 MED ORDER — METOCLOPRAMIDE HCL 5 MG PO TABS
5.0000 mg | ORAL_TABLET | Freq: Two times a day (BID) | ORAL | Status: DC
  Administered 2017-09-08 – 2017-09-10 (×5): 5 mg via ORAL
  Filled 2017-09-08 (×5): qty 1

## 2017-09-08 MED ORDER — ONDANSETRON HCL 4 MG PO TABS: 4.0000 mg | ORAL_TABLET | Freq: Four times a day (QID) | ORAL | Status: DC | PRN

## 2017-09-08 MED ORDER — FUROSEMIDE 40 MG PO TABS
40.0000 mg | ORAL_TABLET | Freq: Every day | ORAL | Status: DC
  Administered 2017-09-08 – 2017-09-10 (×3): 40 mg via ORAL
  Filled 2017-09-08 (×3): qty 1

## 2017-09-08 MED ORDER — MELATONIN 5 MG PO TABS
5.0000 mg | ORAL_TABLET | Freq: Every day | ORAL | Status: DC
  Administered 2017-09-08 – 2017-09-09 (×2): 5 mg via ORAL
  Filled 2017-09-08 (×3): qty 1

## 2017-09-08 MED ORDER — ATORVASTATIN CALCIUM 20 MG PO TABS
80.0000 mg | ORAL_TABLET | Freq: Every day | ORAL | Status: DC
  Administered 2017-09-08 – 2017-09-09 (×2): 80 mg via ORAL
  Filled 2017-09-08 (×2): qty 4

## 2017-09-08 MED ORDER — IPRATROPIUM-ALBUTEROL 0.5-2.5 (3) MG/3ML IN SOLN
3.0000 mL | Freq: Once | RESPIRATORY_TRACT | Status: AC
  Administered 2017-09-08: 3 mL via RESPIRATORY_TRACT
  Filled 2017-09-08: qty 3

## 2017-09-08 MED ORDER — SODIUM CHLORIDE 3 % IN NEBU
4.0000 mL | INHALATION_SOLUTION | RESPIRATORY_TRACT | Status: DC | PRN
  Filled 2017-09-08: qty 4

## 2017-09-08 MED ORDER — ALBUTEROL SULFATE (2.5 MG/3ML) 0.083% IN NEBU
2.5000 mg | INHALATION_SOLUTION | Freq: Four times a day (QID) | RESPIRATORY_TRACT | Status: DC | PRN
  Administered 2017-09-08: 2.5 mg via RESPIRATORY_TRACT

## 2017-09-08 MED ORDER — SODIUM CHLORIDE 0.9 % IV SOLN
1500.0000 mg | Freq: Once | INTRAVENOUS | Status: AC
  Administered 2017-09-08: 1500 mg via INTRAVENOUS
  Filled 2017-09-08: qty 1500

## 2017-09-08 MED ORDER — QUETIAPINE FUMARATE 25 MG PO TABS
50.0000 mg | ORAL_TABLET | Freq: Every day | ORAL | Status: DC
  Administered 2017-09-08 – 2017-09-09 (×2): 50 mg via ORAL
  Filled 2017-09-08 (×2): qty 2

## 2017-09-08 MED ORDER — PANTOPRAZOLE SODIUM 40 MG PO TBEC
40.0000 mg | DELAYED_RELEASE_TABLET | Freq: Two times a day (BID) | ORAL | Status: DC
  Administered 2017-09-08 – 2017-09-10 (×5): 40 mg via ORAL
  Filled 2017-09-08 (×5): qty 1

## 2017-09-08 MED ORDER — INSULIN GLARGINE 100 UNIT/ML ~~LOC~~ SOLN
8.0000 [IU] | Freq: Every day | SUBCUTANEOUS | Status: DC
  Administered 2017-09-08 – 2017-09-09 (×2): 8 [IU] via SUBCUTANEOUS
  Filled 2017-09-08 (×3): qty 0.08

## 2017-09-08 MED ORDER — INSULIN ASPART 100 UNIT/ML ~~LOC~~ SOLN
0.0000 [IU] | Freq: Three times a day (TID) | SUBCUTANEOUS | Status: DC
  Administered 2017-09-08: 3 [IU] via SUBCUTANEOUS
  Administered 2017-09-08 – 2017-09-09 (×3): 2 [IU] via SUBCUTANEOUS
  Administered 2017-09-09 – 2017-09-10 (×2): 3 [IU] via SUBCUTANEOUS
  Filled 2017-09-08 (×6): qty 1

## 2017-09-08 MED ORDER — ACETAMINOPHEN 325 MG PO TABS: 650.0000 mg | ORAL_TABLET | Freq: Four times a day (QID) | ORAL | Status: DC | PRN

## 2017-09-08 MED ORDER — HEPARIN SODIUM (PORCINE) 5000 UNIT/ML IJ SOLN: 5000.0000 [IU] | Freq: Three times a day (TID) | INTRAMUSCULAR | Status: DC

## 2017-09-08 MED ORDER — METOPROLOL TARTRATE 25 MG PO TABS
12.5000 mg | ORAL_TABLET | Freq: Two times a day (BID) | ORAL | Status: DC
  Administered 2017-09-08 – 2017-09-09 (×3): 12.5 mg via ORAL
  Filled 2017-09-08 (×3): qty 1

## 2017-09-08 MED ORDER — EPOETIN ALFA 10000 UNIT/ML IJ SOLN
4000.0000 [IU] | INTRAMUSCULAR | Status: DC
Start: 2017-09-10 — End: 2017-09-10
  Administered 2017-09-10: 4000 [IU] via INTRAVENOUS

## 2017-09-08 MED ORDER — DOCUSATE SODIUM 100 MG PO CAPS
100.0000 mg | ORAL_CAPSULE | Freq: Two times a day (BID) | ORAL | Status: DC
  Administered 2017-09-08 – 2017-09-10 (×5): 100 mg via ORAL
  Filled 2017-09-08 (×5): qty 1

## 2017-09-08 MED ORDER — DEXTROSE 5 % IV SOLN
2.0000 g | Freq: Once | INTRAVENOUS | Status: AC
  Administered 2017-09-08: 2 g via INTRAVENOUS
  Filled 2017-09-08: qty 2

## 2017-09-08 MED ORDER — AMIODARONE HCL 200 MG PO TABS
200.0000 mg | ORAL_TABLET | Freq: Every day | ORAL | Status: DC
  Administered 2017-09-09: 200 mg via ORAL
  Filled 2017-09-08 (×2): qty 1

## 2017-09-08 MED ORDER — APIXABAN 2.5 MG PO TABS
2.5000 mg | ORAL_TABLET | Freq: Two times a day (BID) | ORAL | Status: DC
  Administered 2017-09-08 – 2017-09-10 (×5): 2.5 mg via ORAL
  Filled 2017-09-08 (×5): qty 1

## 2017-09-08 MED ORDER — ALPRAZOLAM 0.25 MG PO TABS: 0.2500 mg | ORAL_TABLET | Freq: Two times a day (BID) | ORAL | Status: DC | PRN

## 2017-09-08 NOTE — ED Triage Notes (Signed)
Hx of trach placed at the beginning of last month 9/6, comes into night for cough and fever. Patient states she is coughing up phenom through the trach.

## 2017-09-08 NOTE — ED Notes (Signed)
md forbach aware of temperature

## 2017-09-08 NOTE — Progress Notes (Signed)
POST DIALYSIS ASSESSMENT 

## 2017-09-08 NOTE — Progress Notes (Signed)
PRE DIALYSIS ASSESSMENT 

## 2017-09-08 NOTE — Consult Note (Signed)
Date: 09/08/2017                  Patient Name:  Deborah [name]  MRN: [id number]  DOB: [birth year]  Age / Sex: 61 y.o., female         PCP: System, Pcp Not In                 Service Requesting Consult: IM/ [name], MD                 Reason for Consult: ESRD, hemodialysis             History of Present Illness: Patient is a 61 y.o. 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 She now dialyzes at [facility] on [address] on Monday, Wednesday and Friday and is followed by [facility] physicians. She presents for complaints of coughing that started yesterday morning. Her last dialysis was yesterday which she completed. She also noticed fever last night. 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 Her cough is productive of clear to yellow sputum.  Her chest x-ray shows cardiomegaly and is concerning for volume overload. Nephrology consult has been requested to evaluate for dialysis.   Medications: Outpatient medications: Prescriptions Prior to Admission  Medication Sig Dispense Refill Last Dose  . acetaminophen (TYLENOL) 325 MG tablet Take 650 mg by mouth.   prn at prn  . albuterol (PROVENTIL) (2.5 MG/3ML) 0.083% nebulizer solution Take 2.5 mg by nebulization every 6 (six) hours as needed.      . ALPRAZolam (XANAX) 0.25 MG tablet Take 1 tablet (0.25 mg total) by mouth 2 (two) times daily as needed for anxiety. 30 tablet 0 09/07/2017 at Unknown time  . amiodarone (PACERONE) 200 MG tablet Take 1 tablet (200 mg total) by mouth daily. 30 tablet 0 09/07/2017  at Unknown time  . apixaban (ELIQUIS) 2.5 MG TABS tablet Take 2.5 mg by mouth 2 (two) times daily.    09/07/2017 at Unknown time  . atorvastatin (LIPITOR) 80 MG tablet Take 80 mg by mouth.     . B Complex-C-Folic Acid (VOL-CARE RX PO) Take 1 tablet by mouth daily.   09/07/2017 at Unknown time  . furosemide (LASIX) 40 MG tablet Take 40 mg by mouth daily. ON mornings of dialysis     . gabapentin (NEURONTIN) 100 MG capsule Take 1 capsule (100 mg total) by mouth 2 (two) times daily. 60 capsule 1 09/07/2017 at Unknown time  . insulin glargine (LANTUS) 100 UNIT/ML injection Inject 14 Units into the skin at bedtime.   09/07/2017 at Unknown time  . Melatonin 3 MG TABS Take 6 mg by mouth.   09/07/2017 at Unknown time  . metoCLOPramide (REGLAN) 5 MG tablet Take 1 tablet (5 mg total) by mouth 2 (two) times daily with a meal. (Patient taking differently: Take 5 mg by mouth 3 (three) times daily before meals. ) 28 tablet 0 prn  . metoprolol tartrate (LOPRESSOR) 25 MG tablet Take 12.5 mg by mouth. Take 1/2 tablet by mouth in the morning and 1/2 tablet by mouth in the evening.  09/07/2017 at Unknown time  . pantoprazole (PROTONIX) 40 MG tablet Take 1 tablet (40 mg total) by mouth 2 (two) times daily. 60 tablet 0 09/08/2017 at Unknown time  . QUEtiapine (SEROQUEL) 50 MG tablet Take 50 mg by mouth.   09/07/2017 at Unknown time  . sodium chloride HYPERTONIC 3 % nebulizer solution Inhale 4 mL by nebulization every 6 (six) hours.   09/07/2017 at Unknown time  . albuterol (PROVENTIL) (2.5 MG/3ML) 0.083% nebulizer solution Take 3 mLs (2.5 mg total) by nebulization 4 (four) times daily - after meals and at bedtime. 150 mL 1 07/12/2017 at 0800  . Blood Glucose Monitoring Suppl (GLUCOCOM BLOOD GLUCOSE MONITOR) DEVI Use to check blood sugars 4-6 times per day Disp per insurance company kit ICD-10 E11.9     . cephALEXin (KEFLEX) 500 MG capsule Take 1 capsule (500 mg total) by mouth 2 (two) times daily. (Patient not taking: Reported on  09/08/2017) 14 capsule 0 Completed Course at Unknown time  . clopidogrel (PLAVIX) 75 MG tablet Take 1 tablet (75 mg total) by mouth daily. (Patient not taking: Reported on 09/08/2017) 30 tablet 0 Not Taking at Unknown time  . Darbepoetin Alfa (ARANESP) 100 MCG/0.5ML SOSY injection Inject 0.5 mLs (100 mcg total) into the vein every Saturday with hemodialysis. (Patient not taking: Reported on 09/08/2017) 4.2 mL  Not Taking at Unknown time  . guaiFENesin (ROBITUSSIN) 100 MG/5ML SOLN Take 30 mLs (600 mg total) by mouth every 6 (six) hours. (Patient not taking: Reported on 09/08/2017) 3000 mL 0 Not Taking at Unknown time  . hydrocortisone (ANUSOL-HC) 2.5 % rectal cream Place rectally 2 (two) times daily as needed for hemorrhoids or itching. (Patient not taking: Reported on 09/08/2017) 30 g 0 Not Taking at Unknown time  . Insulin Detemir (LEVEMIR) 100 UNIT/ML Pen Inject 8 Units into the skin daily at 10 pm. (Patient not taking: Reported on 09/08/2017) 15 mL 0 Not Taking at Unknown time  . NONFORMULARY OR COMPOUNDED ITEM See pharmacy note 120 each 2   . pregabalin (LYRICA) 100 MG capsule Take 1 capsule (100 mg total) by mouth 2 (two) times daily. (Patient not taking: Reported on 09/08/2017) 60 capsule 2 Not Taking at Unknown time  . warfarin (COUMADIN) 2 MG tablet Take 2 mg on Tue, Thu, Sat, Sun (Patient not taking: Reported on 07/12/2017) 30 tablet 0 Not Taking at Unknown time  . warfarin (COUMADIN) 4 MG tablet Take 4 mg on Mon, Wed, Fri with supper (Patient not taking: Reported on 07/12/2017) 30 tablet 0 Not Taking at Unknown time    Current medications: Current Facility-Administered Medications  Medication Dose Route Frequency Provider Last Rate Last Dose  . acetaminophen (TYLENOL) tablet 650 mg  650 mg Oral Q6H PRN Harrie Foreman, MD       Or  . acetaminophen (TYLENOL) suppository 650 mg  650 mg Rectal Q6H PRN Harrie Foreman, MD      . albuterol (PROVENTIL) (2.5 MG/3ML) 0.083% nebulizer solution 2.5 mg   2.5 mg Nebulization TID PC & HS Harrie Foreman, MD   2.5 mg at 09/08/17 0740  . albuterol (PROVENTIL) (2.5 MG/3ML) 0.083% nebulizer solution 2.5 mg  2.5 mg Nebulization Q6H PRN Harrie Foreman, MD      . ALPRAZolam Duanne Moron) tablet 0.25 mg  0.25 mg Oral BID PRN Harrie Foreman, MD      . amiodarone (PACERONE) tablet 200 mg  200 mg Oral Daily Harrie Foreman, MD      .  apixaban (ELIQUIS) tablet 2.5 mg  2.5 mg Oral BID Harrie Foreman, MD      . atorvastatin (LIPITOR) tablet 80 mg  80 mg Oral q1800 Harrie Foreman, MD      . Derrill Memo ON 09/09/2017] ceFEPIme (MAXIPIME) 1 g in dextrose 5 % 50 mL IVPB  1 g Intravenous Q24H Hinda Kehr, MD      . docusate sodium (COLACE) capsule 100 mg  100 mg Oral BID Harrie Foreman, MD      . furosemide (LASIX) tablet 40 mg  40 mg Oral Daily Harrie Foreman, MD      . gabapentin (NEURONTIN) capsule 100 mg  100 mg Oral BID Harrie Foreman, MD      . insulin aspart (novoLOG) injection 0-9 Units  0-9 Units Subcutaneous TID WC Harrie Foreman, MD   2 Units at 09/08/17 0848  . insulin glargine (LANTUS) injection 8 Units  8 Units Subcutaneous QHS Harrie Foreman, MD      . Melatonin TABS 5 mg  5 mg Oral QHS Harrie Foreman, MD      . metoCLOPramide (REGLAN) tablet 5 mg  5 mg Oral BID WC Harrie Foreman, MD   5 mg at 09/08/17 0848  . metoprolol tartrate (LOPRESSOR) tablet 12.5 mg  12.5 mg Oral BID Harrie Foreman, MD      . ondansetron [facility]) tablet 4 mg  4 mg Oral Q6H PRN Harrie Foreman, MD       Or  . ondansetron [facility]) injection 4 mg  4 mg Intravenous Q6H PRN Harrie Foreman, MD      . pantoprazole (PROTONIX) EC tablet 40 mg  40 mg Oral BID Harrie Foreman, MD      . QUEtiapine (SEROQUEL) tablet 50 mg  50 mg Oral QHS Harrie Foreman, MD      . sodium chloride HYPERTONIC 3 % nebulizer solution 4 mL  4 mL Nebulization PRN Harrie Foreman, MD      . vancomycin (VANCOCIN) 500 mg in sodium chloride 0.9 % 100 mL  IVPB  500 mg Intravenous Q dialysis Hinda Kehr, MD          Allergies: No Known Allergies    Past Medical History: Past Medical History:  Diagnosis Date  . Diabetes mellitus   . Hyperlipidemia   . Hypertension   . MI, old   . Renal disorder    dialysls     Past Surgical History: Past Surgical History:  Procedure Laterality Date  . AV FISTULA PLACEMENT Left 10/02/2016   Procedure: INSERTION OF ARTERIOVENOUS (AV) GORE-TEX GRAFT ARM;  Surgeon: Waynetta Sandy, MD;  Location: [place];  Service: Vascular;  Laterality: Left;  . CARDIAC CATHETERIZATION N/A 08/15/2016   Procedure: Left Heart Cath and Coronary Angiography;  Surgeon: Yolonda Kida, MD;  Location: [place] CV LAB;  Service: Cardiovascular;  Laterality: N/A;  . CARDIAC CATHETERIZATION N/A 08/15/2016   Procedure: Coronary Stent Intervention;  Surgeon: Yolonda Kida, MD;  Location: [place] CV LAB;  Service: Cardiovascular;  Laterality: N/A;  . CARDIAC CATHETERIZATION N/A 08/18/2016   Procedure: Right Heart Cath;  Surgeon: Jolaine Artist, MD;  Location: [place] CV LAB;  Service: Cardiovascular;  Laterality: N/A;  . CARDIAC CATHETERIZATION N/A 08/18/2016   Procedure: IABP Insertion;  Surgeon: Jolaine Artist, MD;  Location: [place] CV LAB;  Service: Cardiovascular;  Laterality: N/A;  . CARDIAC CATHETERIZATION Right 08/23/2016   Procedure:  CENTRAL LINE INSERTION RIGHT SUBCLAVIAN;  Surgeon: Ivin Poot, MD;  Location: [place];  Service: Open Heart Surgery;  Laterality: Right;  . ENDOVEIN HARVEST OF GREATER SAPHENOUS VEIN Left 08/23/2016   Procedure: ENDOVEIN HARVEST OF GREATER SAPHENOUS VEIN;  Surgeon: Ivin Poot, MD;  Location: [place];  Service: Open Heart Surgery;  Laterality: Left;  . INSERTION OF DIALYSIS CATHETER Bilateral 08/31/2016   Procedure: INSERTION OF DIALYSIS CATHETER LEFT INTERNAL JUGULAR VEIN & INSERTION OF TRIPLE LUMEN RIGHT INTERNAL JUGULAR VEIN;  Surgeon: Angelia Mould, MD;  Location: [place];  Service: Vascular;  Laterality: Bilateral;  . INTRAOPERATIVE TRANSESOPHAGEAL ECHOCARDIOGRAM N/A 08/23/2016   Procedure: INTRAOPERATIVE TRANSESOPHAGEAL ECHOCARDIOGRAM;  Surgeon: Ivin Poot, MD;  Location: [place];  Service: Open Heart Surgery;  Laterality: N/A;  . IR GENERIC HISTORICAL  11/13/2016   IR US GUIDE VASC ACCESS LEFT 11/13/2016 Corrie Mckusick, DO [facility]  . IR GENERIC HISTORICAL  11/13/2016   IR FLUORO GUIDE CV LINE LEFT 11/13/2016 Corrie Mckusick, DO [facility]  . IR GENERIC HISTORICAL  11/13/2016   IR GASTROSTOMY TUBE MOD SED 11/13/2016 Corrie Mckusick, DO [facility]  . MITRAL VALVE REPAIR N/A 08/23/2016   Procedure: MITRAL VALVE REPAIR (MVR) USING 25MM EDWARDS MAGNA EASE BIOPROSTHESIS MITRAL  VALVE;  Surgeon: Ivin Poot, MD;  Location: [place];  Service: Open Heart Surgery;  Laterality: N/A;  . tracheostomy reversal    . TRACHEOSTOMY TUBE PLACEMENT N/A 11/09/2016   Procedure: TRACHEOSTOMY;  Surgeon: Melissa Montane, MD;  Location: [place];  Service: ENT;  Laterality: N/A;  . VAGINAL DELIVERY     x 6     Family History: Family History  Problem Relation Age of Onset  . Hypertension Mother   . Diabetes Mother   . Breast cancer Sister      Social History: Social History   Social History  . Marital status: Widowed    Spouse name: N/A  . Number of children: 6  . Years of education: N/A   Occupational History  . Not on file.   Social History Main Topics  . Smoking status: Never Smoker  . Smokeless tobacco: Never Used  . Alcohol use No  . Drug use: No  . Sexual activity: Not on file   Other Topics Concern  . Not on file   Social History Narrative   Lives in [place]. Has 2 daughtesr, 3 grandkids.     Review of Systems: Gen: Fevers as described above. No chills. No weight changes HEENT: No hearing or vision problems reported CV: No chest pain or shortness of breath Resp: Productive Cough, some wheezing GI: No nausea.  Vomiting last night. Reports loose stools. No blood in the stool GU : Still makes urine. Denies any blood in the urine MS: Uses rolling walker Derm:  No complaints Psych: No complaints Heme: No complaints Neuro: No complaints Endocrine: No complaints  Vital Signs: Blood pressure (!) 141/62, pulse 73, temperature 99.2 F (37.3 C), temperature source Oral, resp. rate 18, height _0  (1.549 m), weight 64.5 kg (142 lb 1.6 oz), SpO2 98 %.   Intake/Output Summary (Last 24 hours) at 09/08/17 1019 Last data filed at 09/08/17 0852  Gross per 24 hour  Intake                0 ml  Output              225 ml  Net             -  225 ml    Weight trends: Filed Weights   09/08/17 0052 09/08/17 0428  Weight: 61.2 kg (135 lb) 64.5 kg (142 lb 1.6 oz)    Physical Exam: General:  No acute distress, laying in the bed   HEENT Anicteric, moist oral mucous membranes, hoarse voice   Neck:  Tracheostomy in place   Lungs: Diffuse mild wheezing and rhonchi, mild basilar crackles   Heart:: irregular rhythm   Abdomen: Soft, nontender, nondistended   Extremities:  1+ pitting edema bilaterally   Neurologic: Alert, oriented   Skin: No acute rashes   Access: AV fistula, good thrill           Lab results: Basic Metabolic Panel:  Recent Labs Lab 09/08/17 0114  NA 134*  K 3.7  CL 98*  CO2 24  GLUCOSE 288*  BUN 19  CREATININE 3.14*  CALCIUM 8.5*    Liver Function Tests:  Recent Labs Lab 09/08/17 0114  AST 22  ALT 33  ALKPHOS 117  BILITOT 1.8*  PROT 7.0  ALBUMIN 3.8   No results for input(s): LIPASE, AMYLASE in the last 168 hours. No results for input(s): AMMONIA in the last 168 hours.  CBC:  Recent Labs Lab 09/08/17 0114  WBC 16.2*  NEUTROABS 13.9*  HGB 9.9*  HCT 29.9*  MCV 84.7  PLT 277    Cardiac Enzymes: No results for input(s): CKTOTAL, TROPONINI in the last 168 hours.  BNP: Invalid input(s): POCBNP  CBG:  Recent Labs Lab 09/08/17 0818  GLUCAP 199*     Microbiology: Recent Results (from the past 720 hour(s))  MRSA PCR Screening     Status: None   Collection Time: 09/08/17  4:28 AM  Result Value Ref Range Status   MRSA by PCR NEGATIVE NEGATIVE Final    Comment:        The GeneXpert MRSA Assay (FDA approved for NASAL specimens only), is one component of a comprehensive MRSA colonization surveillance program. It is not intended to diagnose MRSA infection nor to guide or monitor treatment for MRSA infections.      Coagulation Studies: No results for input(s): LABPROT, INR in the last 72 hours.  Urinalysis: No results for input(s): COLORURINE, LABSPEC, PHURINE, GLUCOSEU, HGBUR, BILIRUBINUR, KETONESUR, PROTEINUR, UROBILINOGEN, NITRITE, LEUKOCYTESUR in the last 72 hours.  Invalid input(s): APPERANCEUR      Imaging: Dg Chest 2 View  Result Date: 09/08/2017 CLINICAL DATA:  Cough EXAM: CHEST  2 VIEW COMPARISON:  Chest radiograph 07/24/2017 FINDINGS: Tracheostomy tube is in the level of the clavicles. There is unchanged cardiomegaly. There is central pulmonary vascular congestion and suspected early interstitial edema. No pleural effusion or pneumothorax. No focal airspace consolidation. IMPRESSION: Cardiomegaly and pulmonary vascular congestion versus early pulmonary edema. Electronically Signed   By: Ulyses Jarred M.D.   On: 09/08/2017 01:40      Assessment & Plan: Pt is a 61 y.o.   female with h/o Ac resp failure requiring trrach 08/2016, CAD, (08/2016) STEMI with papillary muscle rupture requiring surgical repair of mitral valve, A Fib, h/o cardioversion, ESRD, MRSA bactermia, DM-2 since 2011, h/o Gastric tube now removed;   [facility]/ [address]/ MWF/ [facility] Physicians  1. End-stage renal disease 2. Anemia of chronic kidney disease 3. Cough, pneumonia, fever 4. Acute pulmonary edema 5. Secondary hyperparathyroidism  Plan: Extra hemodialysis treatment today for volume removal to see if fluid optimization will help with  cough Antibiotics as per primary team Monitor phosphorus during admission Epogen with hemodialysis on regular days

## 2017-09-08 NOTE — Consult Note (Signed)
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 Fever, increased cough, yellowish sputum production. Certainly agree with covering for tracheobronchitis, she is presently on vancomycin and cefepime, would check respiratory culture data and de-escalate accordingly, she does have bronchial breath sounds appreciated on the right side with an elevated white count although her procalcitonin is normal. Would continue albuterol and Atrovent. If cough does not improve would add systemic steroids.   Hyponatremia. Per nephrology  Hyperglycemia. On coverage  End-stage renal disease. Being evaluated by nephrology considering increasing intensity of dialysis secondary to bimalleolar lobe pattern on chest x-ray  Leukocytosis. On empiric broad-spectrum coverage  Anemia. No evidence of active bleeding  Name: Deborah [name] MRN: [id number] DOB: 07/04/1956    ADMISSION DATE:  09/08/2017 CONSULTATION DATE:  09/08/2017  REFERRING MD :  Dr. Darvin Neighbours  CHIEF COMPLAINT:  Cough   HISTORY OF PRESENT ILLNESS:  Deborah [name] is a very pleasant 61 year old African-American female with a past medical history remarkable for hypertension, hyperlipidemia, diabetes mellitus, coronary artery disease, history of papillary muscle rupture, mitral valve repair, atrial fibrillation, end-stage renal disease on Monday Wednesday Friday hemodialysis, vocal cord paralysis with chronic tracheostomy, presented with complaints of coughing beginning yesterday morning. She states she did have an episode of fever and sweats, yellowish productive sputum noted. She denied any hemoptysis, she is not on systemic steroids but does take albuterol and Atrovent as needed. Pertinent labs include a normal pro calcitonin, creatinine of 3.14, blood sugar elevated at 288, leukocytosis at 162, hemoglobin of 9.9. Chest x-ray performed revealed cardiomegaly with  pulmonary edema and vascular congestion.  PAST MEDICAL HISTORY :  Past Medical History:  Diagnosis Date  . Diabetes mellitus   . Hyperlipidemia   . Hypertension   . MI, old   . Renal disorder    dialysls   Past Surgical History:  Procedure Laterality Date  . AV FISTULA PLACEMENT Left 10/02/2016   Procedure: INSERTION OF ARTERIOVENOUS (AV) GORE-TEX GRAFT ARM;  Surgeon: Waynetta Sandy, MD;  Location: [place];  Service: Vascular;  Laterality: Left;  . CARDIAC CATHETERIZATION N/A 08/15/2016   Procedure: Left Heart Cath and Coronary Angiography;  Surgeon: Yolonda Kida, MD;  Location: [place] [facility];  Service: Cardiovascular;  Laterality: N/A;  . CARDIAC CATHETERIZATION N/A 08/15/2016   Procedure: Coronary Stent Intervention;  Surgeon: Yolonda Kida, MD;  Location: [place] [facility];  Service: Cardiovascular;  Laterality: N/A;  . CARDIAC CATHETERIZATION N/A 08/18/2016   Procedure: Right Heart Cath;  Surgeon: Jolaine Artist, MD;  Location: [place] [facility];  Service: Cardiovascular;  Laterality: N/A;  . CARDIAC CATHETERIZATION N/A 08/18/2016   Procedure: IABP Insertion;  Surgeon: Jolaine Artist, MD;  Location: [place] [facility];  Service: Cardiovascular;  Laterality: N/A;  . CARDIAC CATHETERIZATION Right 08/23/2016   Procedure: CENTRAL LINE INSERTION RIGHT SUBCLAVIAN;  Surgeon: Ivin Poot, MD;  Location: [place];  Service: Open Heart Surgery;  Laterality: Right;  . ENDOVEIN HARVEST OF GREATER SAPHENOUS VEIN Left 08/23/2016   Procedure: ENDOVEIN HARVEST OF GREATER SAPHENOUS VEIN;  Surgeon: Ivin Poot, MD;  Location: [place];  Service: Open Heart Surgery;  Laterality: Left;  . INSERTION OF DIALYSIS CATHETER Bilateral 08/31/2016   Procedure: INSERTION OF DIALYSIS CATHETER LEFT INTERNAL JUGULAR VEIN & INSERTION OF TRIPLE LUMEN RIGHT INTERNAL JUGULAR VEIN;  Surgeon: Angelia Mould, MD;  Location: [place];  Service:  Vascular;  Laterality: Bilateral;  .  INTRAOPERATIVE TRANSESOPHAGEAL ECHOCARDIOGRAM N/A 08/23/2016   Procedure: INTRAOPERATIVE TRANSESOPHAGEAL ECHOCARDIOGRAM;  Surgeon: Ivin Poot, MD;  Location: [place];  Service: Open Heart Surgery;  Laterality: N/A;  . IR GENERIC HISTORICAL  11/13/2016   IR US GUIDE VASC ACCESS LEFT 11/13/2016 Corrie Mckusick, DO [facility]  . IR GENERIC HISTORICAL  11/13/2016   IR FLUORO GUIDE CV LINE LEFT 11/13/2016 Corrie Mckusick, DO [facility]  . IR GENERIC HISTORICAL  11/13/2016   IR GASTROSTOMY TUBE MOD SED 11/13/2016 Corrie Mckusick, DO [facility]  . MITRAL VALVE REPAIR N/A 08/23/2016   Procedure: MITRAL VALVE REPAIR (MVR) USING 25MM EDWARDS MAGNA EASE BIOPROSTHESIS MITRAL  VALVE;  Surgeon: Ivin Poot, MD;  Location: [place];  Service: Open Heart Surgery;  Laterality: N/A;  . tracheostomy reversal    . TRACHEOSTOMY TUBE PLACEMENT N/A 11/09/2016   Procedure: TRACHEOSTOMY;  Surgeon: Melissa Montane, MD;  Location: [place];  Service: ENT;  Laterality: N/A;  . VAGINAL DELIVERY     x 6   Prior to Admission medications   Medication Sig Start Date End Date Taking? Authorizing Provider  acetaminophen (TYLENOL) 325 MG tablet Take 650 mg by mouth. 04/06/17  Yes [provider]  albuterol (PROVENTIL) (2.5 MG/3ML) 0.083% nebulizer solution Take 2.5 mg by nebulization every 6 (six) hours as needed.    Yes [provider]  ALPRAZolam (XANAX) 0.25 MG tablet Take 1 tablet (0.25 mg total) by mouth 2 (two) times daily as needed for anxiety. 11/01/16  Yes Love, Ivan Anchors, PA-C  amiodarone (PACERONE) 200 MG tablet Take 1 tablet (200 mg total) by mouth daily. 11/01/16  Yes Love, Ivan Anchors, PA-C  apixaban (ELIQUIS) 2.5 MG TABS tablet Take 2.5 mg by mouth 2 (two) times daily.  06/22/17  Yes [provider]  atorvastatin (LIPITOR) 80 MG tablet Take 80 mg by mouth.   Yes [provider]  B Complex-C-Folic Acid (VOL-CARE RX PO) Take 1 tablet by mouth daily.   Yes [provider]  furosemide  (LASIX) 40 MG tablet Take 40 mg by mouth daily. ON mornings of dialysis   Yes [provider]  gabapentin (NEURONTIN) 100 MG capsule Take 1 capsule (100 mg total) by mouth 2 (two) times daily. 08/13/17  Yes Edrick Kins, DPM  insulin glargine (LANTUS) 100 UNIT/ML injection Inject 14 Units into the skin at bedtime.   Yes [provider]  Melatonin 3 MG TABS Take 6 mg by mouth. 04/06/17  Yes [provider]  metoCLOPramide (REGLAN) 5 MG tablet Take 1 tablet (5 mg total) by mouth 2 (two) times daily with a meal. Patient taking differently: Take 5 mg by mouth 3 (three) times daily before meals.  11/01/16  Yes Love, Ivan Anchors, PA-C  metoprolol tartrate (LOPRESSOR) 25 MG tablet Take 12.5 mg by mouth. Take 1/2 tablet by mouth in the morning and 1/2 tablet by mouth in the evening. 06/22/17  Yes [provider]  pantoprazole (PROTONIX) 40 MG tablet Take 1 tablet (40 mg total) by mouth 2 (two) times daily. 11/01/16  Yes Love, Ivan Anchors, PA-C  QUEtiapine (SEROQUEL) 50 MG tablet Take 50 mg by mouth. 04/06/17  Yes [provider]  sodium chloride HYPERTONIC 3 % nebulizer solution Inhale 4 mL by nebulization every 6 (six) hours. 06/22/17 06/22/18 Yes [provider]  albuterol (PROVENTIL) (2.5 MG/3ML) 0.083% nebulizer solution Take 3 mLs (2.5 mg total) by nebulization 4 (four) times daily - after  meals and at bedtime. 11/01/16   Love, Ivan Anchors, PA-C  Blood Glucose Monitoring Suppl (GLUCOCOM BLOOD GLUCOSE MONITOR) DEVI Use to check blood sugars 4-6 times per day Disp per insurance company kit ICD-10 E11.9 07/18/17   [provider]  cephALEXin (KEFLEX) 500 MG capsule Take 1 capsule (500 mg total) by mouth 2 (two) times daily. Patient not taking: Reported on 09/08/2017 07/12/17   Hinda Kehr, MD  clopidogrel (PLAVIX) 75 MG tablet Take 1 tablet (75 mg total) by mouth daily. Patient not taking: Reported on 09/08/2017 11/01/16   Love, Ivan Anchors, PA-C  Darbepoetin Alfa  (ARANESP) 100 MCG/0.5ML SOSY injection Inject 0.5 mLs (100 mcg total) into the vein every Saturday with hemodialysis. Patient not taking: Reported on 09/08/2017 12/02/16   Verlee Monte, MD  guaiFENesin (ROBITUSSIN) 100 MG/5ML SOLN Take 30 mLs (600 mg total) by mouth every 6 (six) hours. Patient not taking: Reported on 09/08/2017 11/01/16   Love, Ivan Anchors, PA-C  hydrocortisone (ANUSOL-HC) 2.5 % rectal cream Place rectally 2 (two) times daily as needed for hemorrhoids or itching. Patient not taking: Reported on 09/08/2017 11/01/16   Love, Ivan Anchors, PA-C  Insulin Detemir (LEVEMIR) 100 UNIT/ML Pen Inject 8 Units into the skin daily at 10 pm. Patient not taking: Reported on 09/08/2017 11/01/16   Bary Leriche, PA-C  NONFORMULARY OR COMPOUNDED ITEM See pharmacy note 07/20/17   Edrick Kins, DPM  pregabalin (LYRICA) 100 MG capsule Take 1 capsule (100 mg total) by mouth 2 (two) times daily. Patient not taking: Reported on 09/08/2017 07/20/17   Edrick Kins, DPM  warfarin (COUMADIN) 2 MG tablet Take 2 mg on Tue, Thu, Sat, Sun Patient not taking: Reported on 07/12/2017 11/01/16   Bary Leriche, PA-C  warfarin (COUMADIN) 4 MG tablet Take 4 mg on Mon, Wed, Fri with supper Patient not taking: Reported on 07/12/2017 11/01/16   Bary Leriche, PA-C   No Known Allergies  FAMILY HISTORY:  Family History  Problem Relation Age of Onset  . Hypertension Mother   . Diabetes Mother   . Breast cancer Sister    SOCIAL HISTORY:  reports that she has never smoked. She has never used smokeless tobacco. She reports that she does not drink alcohol or use drugs.  REVIEW OF SYSTEMS:   Constitutional: Feels well. Cardiovascular: No chest pain.  Pulmonary: Denies dyspnea.   The remainder of systems were reviewed and were found to be negative other than what is documented in the HPI.    VITAL SIGNS: Temp:  [99.2 F (37.3 C)-101.8 F (38.8 C)] 99.2 F (37.3 C) (10/06 0432) Pulse Rate:  [73-82] 73 (10/06 0432) Resp:   [16-27] 18 (10/06 0330) BP: (141-166)/(62-106) 141/62 (10/06 0432) SpO2:  [88 %-100 %] 98 % (10/06 0740) Weight:  [61.2 kg (135 lb)-64.5 kg (142 lb 1.6 oz)] 64.5 kg (142 lb 1.6 oz) (10/06 0428) HEMODYNAMICS:   VENTILATOR SETTINGS:   INTAKE / OUTPUT:  Intake/Output Summary (Last 24 hours) at 09/08/17 1051 Last data filed at 09/08/17 0852  Gross per 24 hour  Intake                0 ml  Output              225 ml  Net             -225 ml    Physical Examination:   VS: BP (!) 141/62 (BP Location: Right Arm)   Pulse 73   Temp  99.2 F (37.3 C) (Oral)   Resp 18   Ht _0  (1.549 m)   Wt 64.5 kg (142 lb 1.6 oz)   SpO2 98%   BMI 26.85 kg/m   General Appearance: No distress  Neuro:without focal findings, mental status. HEENT: PERRLA, EOM intact, no ptosis, no other lesions noticed; Tracheostomy in place, no bleeding or infection noted at site Pulmonary: Central congestion with bronchial breath sounds appreciated right greater than left Cardiovascular  irregular irregular rhythm with controlled ventricular response Abdomen: Benign, Soft, non-tender, No masses, hepatosplenomegaly, No lymphadenopathy Endoc: No evident thyromegaly, no signs of acromegaly. Skin:   warm, no rashes, no ecchymosis  Extremities: normal, no cyanosis, clubbing, no edema, warm with normal capillary refill.    LABS: Reviewed   LABORATORY PANEL:   CBC  Recent Labs Lab 09/08/17 0114  WBC 16.2*  HGB 9.9*  HCT 29.9*  PLT 277    Chemistries   Recent Labs Lab 09/08/17 0114  NA 134*  K 3.7  CL 98*  CO2 24  GLUCOSE 288*  BUN 19  CREATININE 3.14*  CALCIUM 8.5*  AST 22  ALT 33  ALKPHOS 117  BILITOT 1.8*     Recent Labs Lab 09/08/17 0818  GLUCAP 199*   No results for input(s): PHART, PCO2ART, PO2ART in the last 168 hours.  Recent Labs Lab 09/08/17 0114  AST 22  ALT 33  ALKPHOS 117  BILITOT 1.8*  ALBUMIN 3.8    Cardiac Enzymes No results for input(s): TROPONINI in the  last 168 hours.  RADIOLOGY:  Dg Chest 2 View  Result Date: 09/08/2017 CLINICAL DATA:  Cough EXAM: CHEST  2 VIEW COMPARISON:  Chest radiograph 07/24/2017 FINDINGS: Tracheostomy tube is in the level of the clavicles. There is unchanged cardiomegaly. There is central pulmonary vascular congestion and suspected early interstitial edema. No pleural effusion or pneumothorax. No focal airspace consolidation. IMPRESSION: Cardiomegaly and pulmonary vascular congestion versus early pulmonary edema. Electronically Signed   By: Ulyses Jarred M.D.   On: 09/08/2017 01:40     Hermelinda Dellen, DO 09/08/2017, 10:51 AM

## 2017-09-08 NOTE — H&P (Signed)
Deborah [name] is an 61 y.o. female.   Chief Complaint: Cough HPI: The patient with past medical history of respiratory failure status post trach placement, coronary artery disease, hypertension, diabetes and ESRD presents emergency department complaining of cough. The patient developed a nonproductive cough today. She reports subjective fevers and was found to be febrile in the emergency department to 101.62F. The patient has recent history of an 83-monthhospitalization secondary to respiratory failure. Chest x-ray today shows pulmonary edema with possible infiltrate. Oxygen saturations have been normal on room air but the patient has persistent tachypnea. She was given cefepime and vancomycin in the emergency department as well as breathing treatments. Due to her state of sepsis the emergency department staff called the hospitalist service for further management.  Past Medical History:  Diagnosis Date  . Diabetes mellitus   . Hyperlipidemia   . Hypertension   . MI, old   . Renal disorder    dialysls    Past Surgical History:  Procedure Laterality Date  . AV FISTULA PLACEMENT Left 10/02/2016   Procedure: INSERTION OF ARTERIOVENOUS (AV) GORE-TEX GRAFT ARM;  Surgeon: BWaynetta Sandy MD;  [place] [facility]  Service: Vascular;  Laterality: Left;  . CARDIAC CATHETERIZATION N/A 08/15/2016   Procedure: Left Heart Cath and Coronary Angiography;  Surgeon: DYolonda Kida MD;  [place];  Service: Cardiovascular;  Laterality: N/A;  . CARDIAC CATHETERIZATION N/A 08/15/2016   Procedure: Coronary Stent Intervention;  Surgeon: DYolonda Kida MD;  [facility];  Service: Cardiovascular;  Laterality: N/A;  . CARDIAC CATHETERIZATION N/A 08/18/2016   Procedure: Right Heart Cath;  Surgeon: DJolaine Artist MD;  [place];  Service: Cardiovascular;  Laterality: N/A;  . CARDIAC CATHETERIZATION N/A 08/18/2016   Procedure: IABP Insertion;   Surgeon: DJolaine Artist MD;  [place] [facility];  Service: Cardiovascular;  Laterality: N/A;  . CARDIAC CATHETERIZATION Right 08/23/2016   Procedure: CENTRAL LINE INSERTION RIGHT SUBCLAVIAN;  Surgeon: PIvin Poot MD;  [place]  Service: Open Heart Surgery;  Laterality: Right;  . ENDOVEIN HARVEST OF GREATER SAPHENOUS VEIN Left 08/23/2016   Procedure: ENDOVEIN HARVEST OF GREATER SAPHENOUS VEIN;  Surgeon: PIvin Poot MD;  [place]  Service: Open Heart Surgery;  Laterality: Left;  . INSERTION OF DIALYSIS CATHETER Bilateral 08/31/2016   Procedure: INSERTION OF DIALYSIS CATHETER LEFT INTERNAL JUGULAR VEIN & INSERTION OF TRIPLE LUMEN RIGHT INTERNAL JUGULAR VEIN;  Surgeon: CAngelia Mould MD;  [place]  Service: Vascular;  Laterality: Bilateral;  . INTRAOPERATIVE TRANSESOPHAGEAL ECHOCARDIOGRAM N/A 08/23/2016   Procedure: INTRAOPERATIVE TRANSESOPHAGEAL ECHOCARDIOGRAM;  Surgeon: PIvin Poot MD;  [place]  Service: Open Heart Surgery;  Laterality: N/A;  . IR GENERIC HISTORICAL  11/13/2016   IR [place] 11/13/2016 JCorrie Mckusick DO [facility]  . IR GENERIC HISTORICAL  11/13/2016   IR FLUORO GUIDE CV LINE LEFT 11/13/2016 JCorrie Mckusick DO [facility]  . IR GENERIC HISTORICAL  11/13/2016   IR GASTROSTOMY TUBE MOD SED 11/13/2016 JCorrie Mckusick DO [facility]  . MITRAL VALVE REPAIR N/A 08/23/2016   Procedure: MITRAL VALVE REPAIR (MVR) USING 25MM EDWARDS MAGNA EASE BIOPROSTHESIS MITRAL  VALVE;  Surgeon: PIvin Poot MD;  [place] [facility]  Service: Open Heart Surgery;  Laterality: N/A;  . tracheostomy reversal    . TRACHEOSTOMY TUBE PLACEMENT N/A 11/09/2016   Procedure: TRACHEOSTOMY;  Surgeon: JMelissa Montane MD;  [facility];  Service: ENT;  Laterality: N/A;  . VAGINAL DELIVERY     x 6    Family History  Problem Relation Age of Onset  . Hypertension Mother   . Diabetes Mother   . Breast cancer Sister    Social  History:  reports that she has never smoked. She has never used smokeless tobacco. She reports that she does not drink alcohol or use drugs.  Allergies: No Known Allergies   (Not in a hospital admission)  Results for orders placed or performed during the hospital encounter of 09/08/17 (from the past 48 hour(s))  CBC with Differential/Platelet     Status: Abnormal   Collection Time: 09/08/17  1:14 AM  Result Value Ref Range   WBC 16.2 (H) 3.6 - 11.0 K/uL   RBC 3.53 (L) 3.80 - 5.20 MIL/uL   Hemoglobin 9.9 (L) 12.0 - 16.0 g/dL   HCT 29.9 (L) 35.0 - 47.0 %   MCV 84.7 80.0 - 100.0 fL   MCH 28.0 26.0 - 34.0 pg   MCHC 33.1 32.0 - 36.0 g/dL   RDW 20.3 (H) 11.5 - 14.5 %   Platelets 277 150 - 440 K/uL   Neutrophils Relative % 86 %   Neutro Abs 13.9 (H) 1.4 - 6.5 K/uL   Lymphocytes Relative 4 %   Lymphs Abs 0.7 (L) 1.0 - 3.6 K/uL   Monocytes Relative 9 %   Monocytes Absolute 1.5 (H) 0.2 - 0.9 K/uL   Eosinophils Relative 1 %   Eosinophils Absolute 0.1 0 - 0.7 K/uL   Basophils Relative 0 %   Basophils Absolute 0.1 0 - 0.1 K/uL  Lactic acid, plasma     Status: None   Collection Time: 09/08/17  1:14 AM  Result Value Ref Range   Lactic Acid, Venous 1.0 0.5 - 1.9 mmol/L  Comprehensive metabolic panel     Status: Abnormal   Collection Time: 09/08/17  1:14 AM  Result Value Ref Range   Sodium 134 (L) 135 - 145 mmol/L   Potassium 3.7 3.5 - 5.1 mmol/L   Chloride 98 (L) 101 - 111 mmol/L   CO2 24 22 - 32 mmol/L   Glucose, Bld 288 (H) 65 - 99 mg/dL   BUN 19 6 - 20 mg/dL   Creatinine, Ser 3.14 (H) 0.44 - 1.00 mg/dL   Calcium 8.5 (L) 8.9 - 10.3 mg/dL   Total Protein 7.0 6.5 - 8.1 g/dL   Albumin 3.8 3.5 - 5.0 g/dL   AST 22 15 - 41 U/L   ALT 33 14 - 54 U/L   Alkaline Phosphatase 117 38 - 126 U/L   Total Bilirubin 1.8 (H) 0.3 - 1.2 mg/dL   GFR calc non Af Amer 15 (L) >60 mL/min   GFR calc Af Amer 17 (L) >60 mL/min    Comment: (NOTE) The eGFR has been calculated using the CKD EPI  equation. This calculation has not been validated in all clinical situations. eGFR's persistently <60 mL/min signify possible Chronic Kidney Disease.    Anion gap 12 5 - 15   Dg Chest 2 View  Result Date: 09/08/2017 CLINICAL DATA:  Cough EXAM: CHEST  2 VIEW COMPARISON:  Chest radiograph 07/24/2017 FINDINGS: Tracheostomy tube is in the level of the clavicles. There is unchanged cardiomegaly. There is central pulmonary vascular congestion and suspected early interstitial edema. No pleural effusion or pneumothorax. No focal airspace consolidation. IMPRESSION: Cardiomegaly and pulmonary vascular congestion versus early pulmonary edema. Electronically Signed  By: Ulyses Jarred M.D.   On: 09/08/2017 01:40    Review of Systems  Constitutional: Positive for fever. Negative for chills.  HENT: Negative for sore throat and tinnitus.   Eyes: Negative for blurred vision and redness.  Respiratory: Positive for cough and shortness of breath.   Cardiovascular: Negative for chest pain, palpitations, orthopnea and PND.  Gastrointestinal: Negative for abdominal pain, diarrhea, nausea and vomiting.  Genitourinary: Negative for dysuria, frequency and urgency.  Musculoskeletal: Negative for joint pain and myalgias.  Skin: Negative for rash.       No lesions  Neurological: Negative for speech change, focal weakness and weakness.  Endo/Heme/Allergies: Does not bruise/bleed easily.       No temperature intolerance  Psychiatric/Behavioral: Negative for depression and suicidal ideas.    Blood pressure (!) 141/69, pulse 78, temperature (!) 101.8 F (38.8 C), temperature source Oral, resp. rate 18, height 5' 1"  (1.549 m), weight 61.2 kg (135 lb), SpO2 91 %. Physical Exam  Vitals reviewed. Constitutional: She is oriented to person, place, and time. She appears well-developed and well-nourished. No distress.  HENT:  Head: Normocephalic and atraumatic.  Mouth/Throat: Oropharynx is clear and moist.  Eyes:  Pupils are equal, round, and reactive to light. Conjunctivae and EOM are normal. No scleral icterus.  Neck: Normal range of motion. Neck supple. No JVD present. No tracheal deviation present. No thyromegaly present.  Cardiovascular: Normal rate, regular rhythm and normal heart sounds.  Exam reveals no gallop and no friction rub.   No murmur heard. Respiratory: Effort normal and breath sounds normal.  GI: Soft. Bowel sounds are normal. She exhibits no distension. There is no tenderness.  Genitourinary:  Genitourinary Comments: Deferred  Musculoskeletal: Normal range of motion. She exhibits no edema.  Lymphadenopathy:    She has no cervical adenopathy.  Neurological: She is alert and oriented to person, place, and time. No cranial nerve deficit. She exhibits normal muscle tone.  Skin: Skin is warm and dry. No rash noted. No erythema.  Psychiatric: She has a normal mood and affect. Her behavior is normal. Judgment and thought content normal.     Assessment/Plan This is a 62 year old female admitted for sepsis. 1. Sepsis: The patient meets criteria via fever, tachypnea and leukocytosis. She is hemodynamically stable. Follow blood cultures for growth and sensitivities. Continue broad-spectrum antibiotics. 2. HCAP: Possible infiltrate superimposed on interstitial edema. Continue cefepime and vancomycin. The patient has a history of prolonged hospitalization secondary to pneumonia and respiratory failure. She is status post tracheostomy which is still in place. 3. ESRD: On dialysis; the patient still makes urine. I have given her 1 dose of Lasix 40 mg IV. Monitor urine output. Also obtain echocardiogram to assess cardiac function in light of interstitial edema post dialysis treatment. 4. 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 Sliding scale insulin while hospitalized. Also continue gabapentin and Reglan 5. History of atrial  fibrillation: Continue amiodarone and Eliquis. Currently in normal sinus rhythm. 6. Hyperlipidemia: Continue statin therapy 7. DVT prophylaxis: Full dose anticoagulation 8. GI prophylaxis: Pantoprazole per home regimen The patient is a full code. Time spent on admission was inpatient care approximately 45 minutes  Harrie Foreman, MD 09/08/2017, 4:05 AM

## 2017-09-08 NOTE — Progress Notes (Signed)
This note also relates to the following rows which could not be included: Pulse Rate - Cannot attach notes to unvalidated device data Resp - Cannot attach notes to unvalidated device data BP - Cannot attach notes to unvalidated device data  HD STARTED

## 2017-09-08 NOTE — Progress Notes (Signed)
Pharmacy Antibiotic Note  Deborah [name] is a 61 y.o. female admitted on 09/08/2017 with pneumonia.  Pharmacy has been consulted for vancomycin and cefepime dosing.  Plan: HD patient Vancomycin 1500 mg loading dose ordered. 500 mg with every HD. Level will need to be ordered before 3rd HD session once schedule is established.  Cefepime 1 gram q 24 hours ordered.  Height: 5\' 1"  (154.9 cm) Weight: 142 lb 1.6 oz (64.5 kg) IBW/kg (Calculated) : 47.8  Temp (24hrs), Avg:100.6 F (38.1 C), Min:99.2 F (37.3 C), Max:101.8 F (38.8 C)   Recent Labs Lab 09/08/17 0114  WBC 16.2*  CREATININE 3.14*  LATICACIDVEN 1.0    Estimated Creatinine Clearance: 16.4 mL/min (A) (by C-G formula based on SCr of 3.14 mg/dL (H)).    No Known Allergies  Antimicrobials this admission: Vancomycin, cefepime 10/6  >>    >>   Dose adjustments this admission:   Microbiology results: 10/6 BCx: pending 10/6 UCx: pending  10/6 MRSA PCR: pending      10/6 UA: pending  Thank you for allowing pharmacy to be a part of this patient's care.  Sophie Tamez S 09/08/2017 4:52 AM

## 2017-09-08 NOTE — ED Provider Notes (Signed)
[facility] Emergency Department Provider Note  ____________________________________________   First MD Initiated Contact with Patient 09/08/17 0153     (approximate)  I have reviewed the triage vital signs and the nursing notes.   HISTORY  Chief Complaint Cough    HPI Deborah [name] is a 61 y.o. female with a complicated medical history that includes a chronic trach after an 11 month long hospitalization last year that resulted in transfer from [facility] to [facility], trach placement, etc.  This was apparently all secondary to overwhelming sepsis and prolonged respiratory failure.  She is a dialysis patient and has diabetes in addition to her trach.  Her trach was switched out at [facility] about one month ago.  She presents per private vehicle tonight for evaluation of cough and fever.  The symptoms have developed over the last 24 hours.  She reports increased phlegm production.  She denies any specific shortness of breath but the cough has been problematic.  She also felt hot and had a fever of 101.8 in the emergency department. She has not been on antibiotics recently.  She reports that the symptoms are moderate in severity.  She otherwise feels well, has been eating and drinking, denies chest pain, and does report that she has a "upset stomach" and has had a loose stool and an episode of vomiting today.  Nothing in particular makes the patient's symptoms better nor worse.      Past Medical History:  Diagnosis Date  . Diabetes mellitus   . Hyperlipidemia   . Hypertension   . MI, old   . Renal disorder    dialysls    Patient Active Problem List   Diagnosis Date Noted  . Sepsis ([place]) 09/08/2017  . Adjustment disorder with anxious mood 12/09/2016  . Bacteremia due to Staphylococcus aureus   . Staphylococcus aureus bacteremia with sepsis ([place])   . Uncontrolled type 2 diabetes mellitus with complication ([place])   . ST elevation myocardial infarction involving left  main coronary artery ([place])   . Palliative care encounter   . Advance care planning   . Vocal cord dysfunction   . Palliative care by specialist   . Goals of care, counseling/discussion   . Bacteremia   . End-stage renal disease on hemodialysis ([place])   . Paroxysmal atrial fibrillation ([name])   . Heart attack ([place])   . Uncontrolled type 2 diabetes mellitus with complication, without long-term current use of insulin ([place])   . Staphylococcus aureus bacteremia 11/12/2016  . Vocal cord paralysis   . Respiratory distress 11/02/2016  . Chronic anticoagulation   . Acute lower UTI   . Labile blood glucose   . Leukocytosis   . Subtherapeutic international normalized ratio (INR)   . Supratherapeutic INR   . Cough   . Oropharyngeal dysphagia   . Uncontrolled type 2 diabetes mellitus with complication, with long-term current use of insulin ([place])   . Physical debility 10/11/2016  . Metabolic encephalopathy 24/26/8341  . Debility   . ESRD on dialysis ([place])   . Type 2 diabetes mellitus with complication, with long-term current use of insulin ([place])   . Anemia of chronic disease   . Coronary artery disease involving native coronary artery of native heart without angina pectoris   . Acute on chronic respiratory failure with hypoxemia ([place])   . Atrial flutter ([place])   . Persistent cough   . Dysphonia   . Sleep disturbance   . Diabetes mellitus type 2 in  nonobese ([place])   . Stage 3 chronic kidney disease ([place])   . NSTEMI (non-ST elevated myocardial infarction) ([place])   . Tachypnea   . Hyponatremia   . Lymphocytosis   . Acute blood loss anemia   . Pressure injury of skin 09/18/2016  . History of tracheostomy   . Delirium   . Encounter for attention to tracheostomy ([place])   . History of MVR with cardiopulmonary bypass   . Systolic congestive heart failure ([place])   . HCAP (healthcare-associated pneumonia)   . PAF (paroxysmal atrial fibrillation) ([place])   . Acute systolic congestive heart failure  ([place])   . AKI (acute kidney injury) ([place])   . Abnormal LFTs   . S/P MVR (mitral valve replacement)   . Mitral regurgitation 08/23/2016  . Acute on chronic diastolic heart failure ([place]) 08/18/2016  . Acute pulmonary edema ([name])   . Respiratory failure ([place]) 08/17/2016  . Cardiogenic shock ([place]) 08/17/2016  . Acute hypoxemic respiratory failure ([place])   . STEMI (ST elevation myocardial infarction) ([place]) 08/15/2016  . Noncompliance 01/25/2015  . Bereavement 07/09/2014  . Low back pain 02/17/2014  . Cervical cancer screening 11/20/2013  . Routine general medical examination at a health care facility 10/14/2013  . Chronic kidney disease, stage 3 ([place]) 06/14/2012  . Hypertension 03/08/2012  . Diabetes mellitus type 2, controlled 03/08/2012  . Hyperlipidemia 03/08/2012    Past Surgical History:  Procedure Laterality Date  . AV FISTULA PLACEMENT Left 10/02/2016   Procedure: INSERTION OF ARTERIOVENOUS (AV) GORE-TEX GRAFT ARM;  Surgeon: Waynetta Sandy, MD;  Location: [place];  Service: Vascular;  Laterality: Left;  . CARDIAC CATHETERIZATION N/A 08/15/2016   Procedure: Left Heart Cath and Coronary Angiography;  Surgeon: Yolonda Kida, MD;  Location: [place] [facility];  Service: Cardiovascular;  Laterality: N/A;  . CARDIAC CATHETERIZATION N/A 08/15/2016   Procedure: Coronary Stent Intervention;  Surgeon: Yolonda Kida, MD;  Location: [place] [facility];  Service: Cardiovascular;  Laterality: N/A;  . CARDIAC CATHETERIZATION N/A 08/18/2016   Procedure: Right Heart Cath;  Surgeon: Jolaine Artist, MD;  Location: [place] [facility];  Service: Cardiovascular;  Laterality: N/A;  . CARDIAC CATHETERIZATION N/A 08/18/2016   Procedure: IABP Insertion;  Surgeon: Jolaine Artist, MD;  Location: [place] [facility];  Service: Cardiovascular;  Laterality: N/A;  . CARDIAC CATHETERIZATION Right 08/23/2016   Procedure: CENTRAL LINE INSERTION RIGHT SUBCLAVIAN;  Surgeon: Ivin Poot, MD;   Location: [place] [facility];  Service: Open Heart Surgery;  Laterality: Right;  . ENDOVEIN HARVEST OF GREATER SAPHENOUS VEIN Left 08/23/2016   Procedure: ENDOVEIN HARVEST OF GREATER SAPHENOUS VEIN;  Surgeon: Ivin Poot, MD;  Location: [facility];  Service: Open Heart Surgery;  Laterality: Left;  . INSERTION OF DIALYSIS CATHETER Bilateral 08/31/2016   Procedure: INSERTION OF DIALYSIS CATHETER LEFT INTERNAL JUGULAR VEIN & INSERTION OF TRIPLE LUMEN RIGHT INTERNAL JUGULAR VEIN;  Surgeon: Angelia Mould, MD;  Location: [facility];  Service: Vascular;  Laterality: Bilateral;  . INTRAOPERATIVE TRANSESOPHAGEAL ECHOCARDIOGRAM N/A 08/23/2016   Procedure: INTRAOPERATIVE TRANSESOPHAGEAL ECHOCARDIOGRAM;  Surgeon: Ivin Poot, MD;  Location: [place];  Service: Open Heart Surgery;  Laterality: N/A;  . IR GENERIC HISTORICAL  11/13/2016   IR US GUIDE VASC ACCESS LEFT 11/13/2016 Corrie Mckusick, DO [facility]  . IR GENERIC HISTORICAL  11/13/2016   IR FLUORO GUIDE CV LINE LEFT 11/13/2016 Corrie Mckusick, DO [facility]  . IR GENERIC HISTORICAL  11/13/2016   IR GASTROSTOMY TUBE  MOD SED 11/13/2016 Corrie Mckusick, DO [facility]  . MITRAL VALVE REPAIR N/A 08/23/2016   Procedure: MITRAL VALVE REPAIR (MVR) USING 25MM EDWARDS MAGNA EASE BIOPROSTHESIS MITRAL  VALVE;  Surgeon: Ivin Poot, MD;  Location: [facility];  Service: Open Heart Surgery;  Laterality: N/A;  . tracheostomy reversal    . TRACHEOSTOMY TUBE PLACEMENT N/A 11/09/2016   Procedure: TRACHEOSTOMY;  Surgeon: Melissa Montane, MD;  Location: [facility];  Service: ENT;  Laterality: N/A;  . VAGINAL DELIVERY     x 6    Prior to Admission medications   Medication Sig Start Date End Date Taking? Authorizing Provider  acetaminophen (TYLENOL) 325 MG tablet Take 650 mg by mouth. 04/06/17  Yes [provider]  albuterol (PROVENTIL) (2.5 MG/3ML) 0.083% nebulizer solution Take 2.5 mg by nebulization every 6 (six) hours as needed.    Yes [provider]  ALPRAZolam (XANAX)  0.25 MG tablet Take 1 tablet (0.25 mg total) by mouth 2 (two) times daily as needed for anxiety. 11/01/16  Yes Love, Ivan Anchors, PA-C  amiodarone (PACERONE) 200 MG tablet Take 1 tablet (200 mg total) by mouth daily. 11/01/16  Yes Love, Ivan Anchors, PA-C  apixaban (ELIQUIS) 2.5 MG TABS tablet Take 2.5 mg by mouth 2 (two) times daily.  06/22/17  Yes [provider]  atorvastatin (LIPITOR) 80 MG tablet Take 80 mg by mouth.   Yes [provider]  B Complex-C-Folic Acid (VOL-CARE RX PO) Take 1 tablet by mouth daily.   Yes [provider]  furosemide (LASIX) 40 MG tablet Take 40 mg by mouth daily. ON mornings of dialysis   Yes [provider]  gabapentin (NEURONTIN) 100 MG capsule Take 1 capsule (100 mg total) by mouth 2 (two) times daily. 08/13/17  Yes Edrick Kins, DPM  insulin glargine (LANTUS) 100 UNIT/ML injection Inject 14 Units into the skin at bedtime.   Yes [provider]  Melatonin 3 MG TABS Take 6 mg by mouth. 04/06/17  Yes [provider]  metoCLOPramide (REGLAN) 5 MG tablet Take 1 tablet (5 mg total) by mouth 2 (two) times daily with a meal. Patient taking differently: Take 5 mg by mouth 3 (three) times daily before meals.  11/01/16  Yes Love, Ivan Anchors, PA-C  metoprolol tartrate (LOPRESSOR) 25 MG tablet Take 12.5 mg by mouth. Take 1/2 tablet by mouth in the morning and 1/2 tablet by mouth in the evening. 06/22/17  Yes [provider]  pantoprazole (PROTONIX) 40 MG tablet Take 1 tablet (40 mg total) by mouth 2 (two) times daily. 11/01/16  Yes Love, Ivan Anchors, PA-C  QUEtiapine (SEROQUEL) 50 MG tablet Take 50 mg by mouth. 04/06/17  Yes [provider]  sodium chloride HYPERTONIC 3 % nebulizer solution Inhale 4 mL by nebulization every 6 (six) hours. 06/22/17 06/22/18 Yes [provider]  albuterol (PROVENTIL) (2.5 MG/3ML) 0.083% nebulizer solution Take 3 mLs (2.5 mg total) by nebulization 4 (four) times daily - after meals and at  bedtime. 11/01/16   Love, Ivan Anchors, PA-C  Blood Glucose Monitoring Suppl (GLUCOCOM BLOOD GLUCOSE MONITOR) DEVI Use to check blood sugars 4-6 times per day Disp per insurance company kit ICD-10 E11.9 07/18/17   [provider]  cephALEXin (KEFLEX) 500 MG capsule Take 1 capsule (500 mg total) by mouth 2 (two) times daily. Patient not taking: Reported on 09/08/2017 07/12/17   Hinda Kehr, MD  clopidogrel (PLAVIX) 75 MG tablet Take 1 tablet (75 mg total) by mouth daily.  Patient not taking: Reported on 09/08/2017 11/01/16   Love, Ivan Anchors, PA-C  Darbepoetin Alfa (ARANESP) 100 MCG/0.5ML SOSY injection Inject 0.5 mLs (100 mcg total) into the vein every Saturday with hemodialysis. Patient not taking: Reported on 09/08/2017 12/02/16   Verlee Monte, MD  guaiFENesin (ROBITUSSIN) 100 MG/5ML SOLN Take 30 mLs (600 mg total) by mouth every 6 (six) hours. Patient not taking: Reported on 09/08/2017 11/01/16   Love, Ivan Anchors, PA-C  hydrocortisone (ANUSOL-HC) 2.5 % rectal cream Place rectally 2 (two) times daily as needed for hemorrhoids or itching. Patient not taking: Reported on 09/08/2017 11/01/16   Love, Ivan Anchors, PA-C  Insulin Detemir (LEVEMIR) 100 UNIT/ML Pen Inject 8 Units into the skin daily at 10 pm. Patient not taking: Reported on 09/08/2017 11/01/16   Bary Leriche, PA-C  NONFORMULARY OR COMPOUNDED ITEM See pharmacy note 07/20/17   Edrick Kins, DPM  pregabalin (LYRICA) 100 MG capsule Take 1 capsule (100 mg total) by mouth 2 (two) times daily. Patient not taking: Reported on 09/08/2017 07/20/17   Edrick Kins, DPM  warfarin (COUMADIN) 2 MG tablet Take 2 mg on Tue, Thu, Sat, Sun Patient not taking: Reported on 07/12/2017 11/01/16   Bary Leriche, PA-C  warfarin (COUMADIN) 4 MG tablet Take 4 mg on Mon, Wed, Fri with supper Patient not taking: Reported on 07/12/2017 11/01/16   Bary Leriche, PA-C    Allergies Patient has no known allergies.  Family History  Problem Relation Age of Onset  .  Hypertension Mother   . Diabetes Mother   . Breast cancer Sister     Social History Social History  Substance Use Topics  . Smoking status: Never Smoker  . Smokeless tobacco: Never Used  . Alcohol use No    Review of Systems Constitutional: Fever to 101.8 Eyes: No visual changes. ENT: No sore throat. Cardiovascular: Denies chest pain. Respiratory: Reactive cough through trach.  Denies shortness of breath.   Gastrointestinal: No abdominal pain.  One episode of vomiting and an episode of loose stool Genitourinary: Negative for dysuria (patient still produces urine and typically urinates 2-3 times per day) Musculoskeletal: Negative for neck pain.  Negative for back pain. Integumentary: Negative for rash. Neurological: Negative for headaches, focal weakness or numbness.   ____________________________________________   PHYSICAL EXAM:  VITAL SIGNS: ED Triage Vitals  Enc Vitals Group     BP 09/08/17 0051 (!) 150/106     Pulse Rate 09/08/17 0051 82     Resp 09/08/17 0051 20     Temp 09/08/17 0051 (!) 100.7 F (38.2 C)     Temp Source 09/08/17 0051 Oral     SpO2 09/08/17 0051 93 %     Weight 09/08/17 0052 61.2 kg (135 lb)     Height 09/08/17 0052 1.549 m (5' 1" )     Head Circumference --      Peak Flow --      Pain Score --      Pain Loc --      Pain Edu? --      Excl. in [place]? --     Constitutional: Alert and oriented. Well appearing and in no acute distress. Patient is able to converse using a valve on her trach Eyes: Conjunctivae are normal.  Head: Atraumatic. Nose: No congestion/rhinnorhea. Mouth/Throat: Mucous membranes are moist. Neck: No stridor.  No meningeal signs.  Trach in place and functioning well. Cardiovascular: Normal rate, regular rhythm. Good peripheral circulation. Grossly normal heart sounds. Respiratory:  Slightly increased respiratory effort.  No retractions. Lungs CTAB.  Frequent cough, occasionally productive Gastrointestinal: Soft and nontender.  No distention.  Musculoskeletal: No lower extremity tenderness nor edema. No gross deformities of extremities. Neurologic:  Normal speech and language. No gross focal neurologic deficits are appreciated.  Skin:  Skin is warm, dry and intact. No rash noted. Psychiatric: Mood and affect are normal. Speech and behavior are normal.  ____________________________________________   LABS (all labs ordered are listed, but only abnormal results are displayed)  Labs Reviewed  CBC WITH DIFFERENTIAL/PLATELET - Abnormal; Notable for the following:       Result Value   WBC 16.2 (*)    RBC 3.53 (*)    Hemoglobin 9.9 (*)    HCT 29.9 (*)    RDW 20.3 (*)    Neutro Abs 13.9 (*)    Lymphs Abs 0.7 (*)    Monocytes Absolute 1.5 (*)    All other components within normal limits  COMPREHENSIVE METABOLIC PANEL - Abnormal; Notable for the following:    Sodium 134 (*)    Chloride 98 (*)    Glucose, Bld 288 (*)    Creatinine, Ser 3.14 (*)    Calcium 8.5 (*)    Total Bilirubin 1.8 (*)    GFR calc non Af Amer 15 (*)    GFR calc Af Amer 17 (*)    All other components within normal limits  MRSA PCR SCREENING  CULTURE, BLOOD (ROUTINE X 2)  CULTURE, BLOOD (ROUTINE X 2)  URINE CULTURE  CULTURE, EXPECTORATED SPUTUM-ASSESSMENT  LACTIC ACID, PLASMA  PROCALCITONIN  TSH  URINALYSIS, ROUTINE W REFLEX MICROSCOPIC  HEMOGLOBIN A1C   ____________________________________________  EKG  None - EKG not ordered by ED physician ____________________________________________  RADIOLOGY   Dg Chest 2 View  Result Date: 09/08/2017 CLINICAL DATA:  Cough EXAM: CHEST  2 VIEW COMPARISON:  Chest radiograph 07/24/2017 FINDINGS: Tracheostomy tube is in the level of the clavicles. There is unchanged cardiomegaly. There is central pulmonary vascular congestion and suspected early interstitial edema. No pleural effusion or pneumothorax. No focal airspace consolidation. IMPRESSION: Cardiomegaly and pulmonary vascular congestion  versus early pulmonary edema. Electronically Signed   By: Ulyses Jarred M.D.   On: 09/08/2017 01:40    ____________________________________________   PROCEDURES  Critical Care performed: Yes, see critical care procedure note(s)   Procedure(s) performed:   Procedures   ____________________________________________   INITIAL IMPRESSION / ASSESSMENT AND PLAN / ED COURSE  As part of my medical decision making, I reviewed the following data within the [place] History obtained from family, Nursing notes reviewed and incorporated, [facility] , Old chart reviewed, Radiograph reviewed (see Hospital Course for details)  and Notes from prior ED visits    The patient is having a frequent cough which results in increased work of breathing.  She has a leukocytosis of 16.2 and a normal lactic acid.  However she is febrile and her chest x-ray, which reflects pulmonary vascular congestion versus early pulmonary edema, may also reflect healthcare associated pneumonia, particularly given the fact that she has a trach and most likely has some occasional aspiration.  I am concerned about the fact that she has these lung changes in spite of having a full course of dialysis today.  She is a high-risk patient based on all of her comorbidities which include end-stage renal disease on hemodialysis, diabetes, and her tracheostomy.  I discussed with her and her family at length the  benefit of inpatient treatment for healthcare associated pneumonia versus outpatient follow-up.  They agreed for her to stay in the hospital for IV antibiotics and further evaluation.  I treated her with cefepime and vancomycin given all of her recent healthcare exposures.     ____________________________________________  FINAL CLINICAL IMPRESSION(S) / ED DIAGNOSES  Final diagnoses:  Fever, unspecified fever cause  Cough  HCAP (healthcare-associated pneumonia)  ESRD on  hemodialysis ([place])     MEDICATIONS GIVEN DURING THIS VISIT:  Medications  acetaminophen (TYLENOL) tablet 650 mg (not administered)    Or  acetaminophen (TYLENOL) suppository 650 mg (not administered)  ondansetron (ZOFRAN) tablet 4 mg (not administered)    Or  ondansetron (ZOFRAN) injection 4 mg (not administered)  docusate sodium (COLACE) capsule 100 mg (not administered)  albuterol (PROVENTIL) (2.5 MG/3ML) 0.083% nebulizer solution 2.5 mg (2.5 mg Nebulization Given 09/08/17 0740)  ALPRAZolam (XANAX) tablet 0.25 mg (not administered)  amiodarone (PACERONE) tablet 200 mg (not administered)  metoCLOPramide (REGLAN) tablet 5 mg (not administered)  pantoprazole (PROTONIX) EC tablet 40 mg (not administered)  apixaban (ELIQUIS) tablet 2.5 mg (not administered)  metoprolol tartrate (LOPRESSOR) tablet 12.5 mg (not administered)  sodium chloride HYPERTONIC 3 % nebulizer solution 4 mL (not administered)  albuterol (PROVENTIL) (2.5 MG/3ML) 0.083% nebulizer solution 2.5 mg (not administered)  QUEtiapine (SEROQUEL) tablet 50 mg (not administered)  gabapentin (NEURONTIN) capsule 100 mg (not administered)  furosemide (LASIX) tablet 40 mg (not administered)  atorvastatin (LIPITOR) tablet 80 mg (not administered)  Melatonin TABS 5 mg (not administered)  ceFEPIme (MAXIPIME) 1 g in dextrose 5 % 50 mL IVPB (not administered)  vancomycin (VANCOCIN) 500 mg in sodium chloride 0.9 % 100 mL IVPB (not administered)  insulin glargine (LANTUS) injection 8 Units (not administered)  insulin aspart (novoLOG) injection 0-9 Units (not administered)  ipratropium-albuterol (DUONEB) 0.5-2.5 (3) MG/3ML nebulizer solution 3 mL (3 mLs Nebulization Given 09/08/17 0309)  ceFEPIme (MAXIPIME) 2 g in dextrose 5 % 50 mL IVPB (0 g Intravenous Stopped 09/08/17 0346)  vancomycin (VANCOCIN) 1,500 mg in sodium chloride 0.9 % 500 mL IVPB (1,500 mg Intravenous New Bag/Given 09/08/17 0446)  furosemide (LASIX) injection 40 mg (40 mg  Intravenous Given 09/08/17 0459)     NEW OUTPATIENT MEDICATIONS STARTED DURING THIS VISIT:  Current Discharge Medication List      Current Discharge Medication List      Current Discharge Medication List       Note:  This document was prepared using Dragon voice recognition software and may include unintentional dictation errors.    Hinda Kehr, MD 09/08/17 [phone]

## 2017-09-08 NOTE — Progress Notes (Signed)
This note also relates to the following rows which could not be included: Pulse Rate - Cannot attach notes to unvalidated device data Resp - Cannot attach notes to unvalidated device data BP - Cannot attach notes to unvalidated device data  HD COMPLETED  

## 2017-09-08 NOTE — ED Notes (Signed)
Patient transported to X-ray 

## 2017-09-08 NOTE — ED Notes (Signed)
Pt to floor with maxipime hanging.

## 2017-09-09 ENCOUNTER — Inpatient Hospital Stay: Payer: Medicaid Other

## 2017-09-09 ENCOUNTER — Inpatient Hospital Stay (HOSPITAL_COMMUNITY)
Admit: 2017-09-09 | Discharge: 2017-09-09 | Disposition: A | Discharge status: Home / Self Care | Payer: Medicaid Other | Attending: Internal Medicine | Admitting: Internal Medicine

## 2017-09-09 DIAGNOSIS — I361 Nonrheumatic tricuspid (valve) insufficiency: Secondary | ICD-10-CM

## 2017-09-09 LAB — CBC WITH DIFFERENTIAL/PLATELET
Basophils Absolute: 0.1 K/uL (ref 0–0.1)
Basophils Relative: 1 %
Eosinophils Absolute: 0.2 K/uL (ref 0–0.7)
Eosinophils Relative: 2 %
HCT: 27.8 % — ABNORMAL LOW (ref 35.0–47.0)
Hemoglobin: 9.3 g/dL — ABNORMAL LOW (ref 12.0–16.0)
Lymphocytes Relative: 11 %
Lymphs Abs: 1.1 K/uL (ref 1.0–3.6)
MCH: 28.7 pg (ref 26.0–34.0)
MCHC: 33.3 g/dL (ref 32.0–36.0)
MCV: 86.2 fL (ref 80.0–100.0)
Monocytes Absolute: 1.2 K/uL — ABNORMAL HIGH (ref 0.2–0.9)
Monocytes Relative: 12 %
Neutro Abs: 7.4 K/uL — ABNORMAL HIGH (ref 1.4–6.5)
Neutrophils Relative %: 74 %
Platelets: 270 K/uL (ref 150–440)
RBC: 3.23 MIL/uL — ABNORMAL LOW (ref 3.80–5.20)
RDW: 20.8 % — ABNORMAL HIGH (ref 11.5–14.5)
WBC: 10 K/uL (ref 3.6–11.0)

## 2017-09-09 LAB — URINALYSIS, ROUTINE W REFLEX MICROSCOPIC
Bacteria, UA: NONE SEEN
Bilirubin Urine: NEGATIVE
Glucose, UA: NEGATIVE mg/dL
Hgb urine dipstick: NEGATIVE
Ketones, ur: NEGATIVE mg/dL
Nitrite: NEGATIVE
Protein, ur: 100 mg/dL — AB
Specific Gravity, Urine: 1.013 (ref 1.005–1.030)
pH: 7 (ref 5.0–8.0)

## 2017-09-09 LAB — ECHOCARDIOGRAM COMPLETE
Height: 61 in
Weight: 2306.89 oz

## 2017-09-09 LAB — GLUCOSE, CAPILLARY
Glucose-Capillary: 208 mg/dL — ABNORMAL HIGH (ref 65–99)
Glucose-Capillary: 189 mg/dL — ABNORMAL HIGH (ref 65–99)
Glucose-Capillary: 181 mg/dL — ABNORMAL HIGH (ref 65–99)
Glucose-Capillary: 167 mg/dL — ABNORMAL HIGH (ref 65–99)

## 2017-09-09 LAB — PROCALCITONIN: Procalcitonin: 0.35 ng/mL

## 2017-09-09 MED ORDER — AMOXICILLIN-POT CLAVULANATE 875-125 MG PO TABS: 1.0000 | ORAL_TABLET | Freq: Two times a day (BID) | ORAL | 0 refills | Status: DC

## 2017-09-09 NOTE — Discharge Instructions (Signed)
Resume diet and activity as before ° ° °

## 2017-09-09 NOTE — Progress Notes (Signed)
*  PRELIMINARY RESULTS* Echocardiogram 2D Echocardiogram has been performed.  Deborah [name] Deborah [name] 09/09/2017, 11:48 AM

## 2017-09-09 NOTE — Progress Notes (Signed)
[facility], [place] 09/09/17  Subjective:   Patient feels better today after HD. 1500 cc was removed yesterday Cough has improved. No further fevers Patient and family are asking about going home  Objective:  Vital signs in last 24 hours:  Temp:  [98.2 F (36.8 C)-98.7 F (37.1 C)] 98.2 F (36.8 C) (10/07 0449) Pulse Rate:  [62-72] 64 (10/07 0449) Resp:  [15-23] 18 (10/07 0449) BP: (109-139)/(51-68) 113/51 (10/07 0449) SpO2:  [98 %-100 %] 99 % (10/07 0738) FiO2 (%):  [30 %] 30 % (10/07 0738) Weight:  [64.2 kg (141 lb 8.6 oz)-66 kg (145 lb 8.1 oz)] 65.4 kg (144 lb 2.9 oz) (10/07 0543)  Weight change: 4.764 kg (10 lb 8.1 oz) Filed Weights   09/08/17 1415 09/08/17 1715 09/09/17 0543  Weight: 66 kg (145 lb 8.1 oz) 64.2 kg (141 lb 8.6 oz) 65.4 kg (144 lb 2.9 oz)    Intake/Output:    Intake/Output Summary (Last 24 hours) at 09/09/17 1138 Last data filed at 09/09/17 0900  Gross per 24 hour  Intake              240 ml  Output             1500 ml  Net            -1260 ml     Physical Exam: General: NAD, laying in bed  HEENT anicteric  Neck Trach in place  Pulm/lungs Still has some mild crackles at bases  CVS/Heart irregular, soft systolic murmur  Abdomen:  Soft, NT  Extremities: No edema  Neurologic: Alert, oriented  Skin: No acute rashes  Access: AVF       Basic Metabolic Panel:   Recent Labs Lab 09/08/17 0114 09/08/17 1305  NA 134*  --   K 3.7  --   CL 98*  --   CO2 24  --   GLUCOSE 288*  --   BUN 19  --   CREATININE 3.14*  --   CALCIUM 8.5*  --   PHOS  --  3.2     CBC:  Recent Labs Lab 09/08/17 0114 09/09/17 0423  WBC 16.2* 10.0  NEUTROABS 13.9* 7.4*  HGB 9.9* 9.3*  HCT 29.9* 27.8*  MCV 84.7 86.2  PLT 277 270     Lab Results  Component Value Date   HEPBSAG Negative 11/14/2016   HEPBSAB Non Reactive 08/17/2016   HEPBIGM Negative 08/26/2016      Microbiology:  Recent Results (from the past 240  hour(s))  Blood culture (routine x 2)     Status: None (Preliminary result)   Collection Time: 09/08/17  2:08 AM  Result Value Ref Range Status   Specimen Description BLOOD RIGHT ANTECUBITAL  Final   Special Requests   Final    BOTTLES DRAWN AEROBIC AND ANAEROBIC Blood Culture adequate volume   Culture NO GROWTH 1 DAY  Final   Report Status PENDING  Incomplete  Blood culture (routine x 2)     Status: None (Preliminary result)   Collection Time: 09/08/17  2:08 AM  Result Value Ref Range Status   Specimen Description BLOOD BLOOD RIGHT FOREARM  Final   Special Requests   Final    BOTTLES DRAWN AEROBIC AND ANAEROBIC Blood Culture adequate volume   Culture NO GROWTH 1 DAY  Final   Report Status PENDING  Incomplete  MRSA PCR Screening     Status: None   Collection Time: 09/08/17  4:28 AM  Result Value Ref Range Status   MRSA by PCR NEGATIVE NEGATIVE Final    Comment:        The GeneXpert MRSA Assay (FDA approved for NASAL specimens only), is one component of a comprehensive MRSA colonization surveillance program. It is not intended to diagnose MRSA infection nor to guide or monitor treatment for MRSA infections.   Culture, respiratory (NON-Expectorated)     Status: None (Preliminary result)   Collection Time: 09/08/17  5:48 PM  Result Value Ref Range Status   Specimen Description INDUCED SPUTUM  Final   Special Requests NONE  Final   Gram Stain   Final    FEW WBC PRESENT, PREDOMINANTLY PMN FEW GRAM NEGATIVE RODS Performed at [facility], [address] [place], [place] [address]    Culture PENDING  Incomplete   Report Status PENDING  Incomplete    Coagulation Studies: No results for input(s): LABPROT, INR in the last 72 hours.  Urinalysis:  Recent Labs  09/09/17 1056  COLORURINE YELLOW*  LABSPEC 1.013  PHURINE 7.0  GLUCOSEU NEGATIVE  HGBUR NEGATIVE  BILIRUBINUR NEGATIVE  KETONESUR NEGATIVE  PROTEINUR 100*  NITRITE NEGATIVE  LEUKOCYTESUR MODERATE*       Imaging: Dg Chest 2 View  Result Date: 09/08/2017 CLINICAL DATA:  Cough EXAM: CHEST  2 VIEW COMPARISON:  Chest radiograph 07/24/2017 FINDINGS: Tracheostomy tube is in the level of the clavicles. There is unchanged cardiomegaly. There is central pulmonary vascular congestion and suspected early interstitial edema. No pleural effusion or pneumothorax. No focal airspace consolidation. IMPRESSION: Cardiomegaly and pulmonary vascular congestion versus early pulmonary edema. Electronically Signed   By: Ulyses Jarred M.D.   On: 09/08/2017 01:40     Medications:   . ceFEPime (MAXIPIME) IV     . albuterol  2.5 mg Nebulization TID PC & HS  . amiodarone  200 mg Oral Daily  . apixaban  2.5 mg Oral BID  . atorvastatin  80 mg Oral q1800  . docusate sodium  100 mg Oral BID  . [START ON 09/10/2017] epoetin (EPOGEN/PROCRIT) injection  4,000 Units Intravenous Q M,W,F-HD  . furosemide  40 mg Oral Daily  . gabapentin  100 mg Oral BID  . insulin aspart  0-9 Units Subcutaneous TID WC  . insulin glargine  8 Units Subcutaneous QHS  . Melatonin  5 mg Oral QHS  . metoCLOPramide  5 mg Oral BID WC  . metoprolol tartrate  12.5 mg Oral BID  . pantoprazole  40 mg Oral BID  . 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 End-stage renal disease 2. Anemia of chronic kidney disease 3. Cough, pneumonia, fever 4. Acute pulmonary edema 5. Secondary hyperparathyroidism  Plan: Extra hemodialysis treatment removed 1500 with clinical improvement Next HD planned for  Monday. CXR still concerning for fluid and cardiomegaly Patient asking about going h ome and thinking about AMA Encouraged to stay for her treatments as advised Antibiotics as per primary team Monitor phosphorus during admission, 3.2 currently Epogen with hemodialysis on regular days   LOS: 1 Twinkle Sockwell 10/7/201811:38 [place], [place]  [phone] 

## 2017-09-09 NOTE — Progress Notes (Signed)
[place] at [place] NAME: Deborah [name]    MR#:  [id number]  DATE OF BIRTH:  [birth year]  SUBJECTIVE:  CHIEF COMPLAINT:   Chief Complaint  Patient presents with  . Cough   Feels better. Cough decreased. Had dialysis yesterday. Afebrile today. Chest x-ray still shows pulmonary edema and pleural effusion.  REVIEW OF SYSTEMS:    Review of Systems  Constitutional: Positive for malaise/fatigue. Negative for chills and fever.  HENT: Negative for sore throat.   Eyes: Negative for blurred vision, double vision and pain.  Respiratory: Positive for cough, sputum production and shortness of breath. Negative for hemoptysis and wheezing.   Cardiovascular: Negative for chest pain, palpitations, orthopnea and leg swelling.  Gastrointestinal: Negative for abdominal pain, constipation, diarrhea, heartburn, nausea and vomiting.  Genitourinary: Negative for dysuria and hematuria.  Musculoskeletal: Negative for back pain and joint pain.  Skin: Negative for rash.  Neurological: Positive for weakness. Negative for sensory change, speech change, focal weakness and headaches.  Endo/Heme/Allergies: Does not bruise/bleed easily.  Psychiatric/Behavioral: Negative for depression. The patient is not nervous/anxious.     DRUG ALLERGIES:  No Known Allergies  VITALS:  Blood pressure (!) 141/59, pulse 69, temperature 98.3 F (36.8 C), temperature source Oral, resp. rate 14, height 5\' 1"  (1.549 m), weight 65.4 kg (144 lb 2.9 oz), SpO2 99 %.  PHYSICAL EXAMINATION:   Physical Exam  GENERAL:  61 y.o.-year-old patient lying in the bed with no acute distress.  EYES: Pupils equal, round, reactive to light and accommodation. No scleral icterus. Extraocular muscles intact.  HEENT: Head atraumatic, normocephalic. Oropharynx and nasopharynx clear. Tracheostomy in place. NECK:  Supple, no jugular venous distention. No thyroid enlargement, no tenderness.  LUNGS: b/l coarse  breath sounds CARDIOVASCULAR: S1, S2 normal. No murmurs, rubs, or gallops.  ABDOMEN: Soft, nontender, nondistended. Bowel sounds present. No organomegaly or mass.  EXTREMITIES: No cyanosis, clubbing or edema b/l.    NEUROLOGIC: Cranial nerves II through XII are intact. No focal Motor or sensory deficits b/l.   PSYCHIATRIC: The patient is alert and oriented x 3.  SKIN: No obvious rash, lesion, or ulcer.   LABORATORY PANEL:   CBC  Recent Labs Lab 09/09/17 0423  WBC 10.0  HGB 9.3*  HCT 27.8*  PLT 270   ------------------------------------------------------------------------------------------------------------------ Chemistries   Recent Labs Lab 09/08/17 0114  NA 134*  K 3.7  CL 98*  CO2 24  GLUCOSE 288*  BUN 19  CREATININE 3.14*  CALCIUM 8.5*  AST 22  ALT 33  ALKPHOS 117  BILITOT 1.8*   ------------------------------------------------------------------------------------------------------------------  Cardiac Enzymes No results for input(s): TROPONINI in the last 168 hours. ------------------------------------------------------------------------------------------------------------------  RADIOLOGY:  Dg Chest 2 View  Result Date: 09/08/2017 CLINICAL DATA:  Cough EXAM: CHEST  2 VIEW COMPARISON:  Chest radiograph 07/24/2017 FINDINGS: Tracheostomy tube is in the level of the clavicles. There is unchanged cardiomegaly. There is central pulmonary vascular congestion and suspected early interstitial edema. No pleural effusion or pneumothorax. No focal airspace consolidation. IMPRESSION: Cardiomegaly and pulmonary vascular congestion versus early pulmonary edema. Electronically Signed   By: Ulyses Jarred M.D.   On: 09/08/2017 01:40   Dg Chest Port 1 View  Result Date: 09/09/2017 CLINICAL DATA:  Shortness of breath. EXAM: PORTABLE CHEST 1 VIEW COMPARISON:  09/08/2017 FINDINGS: Tracheostomy appears unchanged. Chronic cardiomegaly with mitral or tricuspid valve replacement.  Patchy atelectasis and or infiltrate in both lower lobes. Upper lungs are clear. Tiny amount of pleural fluid on the  right. IMPRESSION: Patchy atelectasis and or infiltrate in both lower lobes. Small amount of pleural fluid on the right. Upper lobes clear. Electronically Signed   By: Nelson Chimes M.D.   On: 09/09/2017 11:49     ASSESSMENT AND PLAN:   * Acute on chronic diastolic congestive heart failure * Acute on chronic hypoxic respiratory failure with tracheostomy * Bilateral pneumonia * Atrial fibrillation * Diabetes mellitus type 2 * End-stage renal disease on hemodialysis  Patient is improving with dialysis. Also on IV antibiotics. MRSA screen negative. We'll stop vancomycin. Tracheal aspirate cultures are pending. Appreciate pulmonary input. We'll reassess this patient after dialysis tomorrow. Most likely discharge home on Augmentin tomorrow after dialysis.  All the records are reviewed and case discussed with Care Management/Social Workerr. Management plans discussed with the patient, family and they are in agreement.  CODE STATUS: FULL CODE  DVT Prophylaxis: SCDs  TOTAL TIME TAKING CARE OF THIS PATIENT: 35 minutes.   POSSIBLE D/C IN 1-2 DAYS, DEPENDING ON CLINICAL CONDITION.  [name] R M.D on 09/09/2017 at 1:27 PM  Between 7am to 6pm - Pager - [phone]  After 6pm go to [email or web address] - password [email or web address] [facility]  Office  [phone]  CC: Primary care physician; System, Pcp Not In  Note: This dictation was prepared with Dragon dictation along with smaller phrase technology. Any transcriptional errors that result from this process are unintentional.

## 2017-09-10 LAB — GLUCOSE, CAPILLARY: Glucose-Capillary: 225 mg/dL — ABNORMAL HIGH (ref 65–99)

## 2017-09-10 LAB — PROCALCITONIN: Procalcitonin: 0.3 ng/mL

## 2017-09-10 NOTE — Procedures (Signed)
Pt arrived to HDU via hospital transport staff at or about 1055 and was assessed prior to tx and found to have a LUA AVG, which was prepared per p&p and cannulated w 15 ga needles w/o issue; order includes RTD 3.5 hours on 3251 bath, BFR 400, DFR 600 and attempt to achieve 1.5 to 2 kg fluid removal as tolerated by pt; pt initiated at 1115 w/o issue but note higher venous pressures at BFR 300 but pressures do not improve with needle repositioning; pt completed her entire tx and achieved target of 1.6 kg fluid removal w/o complication, all blood was rinsed back and needles aseptically de-cannulated and pressure dressings applied; hemostasis achieved w/o prolonged bleeding and pt was stable at the end of her tx, upon post tx assessment and when leaving HDU under the care of transport staff; report called to primary RN

## 2017-09-10 NOTE — Progress Notes (Signed)
[place], [place] 09/10/17  Subjective:  Patient seen at bedside. She will be due for hemodialysis today.    Objective:  Vital signs in last 24 hours:  Temp:  [97.7 F (36.5 C)-98.3 F (36.8 C)] 97.7 F (36.5 C) (10/08 0900) Pulse Rate:  [63-71] 70 (10/08 0900) Resp:  [14-17] 15 (10/08 0900) BP: (108-141)/(50-59) 108/58 (10/08 0900) SpO2:  [96 %-100 %] 98 % (10/08 0900) FiO2 (%):  [28 %-30 %] 28 % (10/08 0748) Weight:  [64.1 kg (141 lb 5 oz)] 64.1 kg (141 lb 5 oz) (10/08 0541)  Weight change: -1.9 kg (-4 lb 3 oz) Filed Weights   09/08/17 1715 09/09/17 0543 09/10/17 0541  Weight: 64.2 kg (141 lb 8.6 oz) 65.4 kg (144 lb 2.9 oz) 64.1 kg (141 lb 5 oz)    Intake/Output:    Intake/Output Summary (Last 24 hours) at 09/10/17 1049 Last data filed at 09/10/17 0541  Gross per 24 hour  Intake               50 ml  Output              200 ml  Net             -150 ml     Physical Exam: General: NAD, laying in bed  HEENT anicteric  Neck Trach in place  Pulm/lungs Scattered rhonchi, normal effort  CVS/Heart irregular, soft systolic murmur  Abdomen:  Soft, NTND  Extremities: No edema  Neurologic: Alert, oriented, follows commands  Skin: No acute rashes  Access: AVF       Basic Metabolic Panel:   Recent Labs Lab 09/08/17 0114 09/08/17 1305  NA 134*  --   K 3.7  --   CL 98*  --   CO2 24  --   GLUCOSE 288*  --   BUN 19  --   CREATININE 3.14*  --   CALCIUM 8.5*  --   PHOS  --  3.2     CBC:  Recent Labs Lab 09/08/17 0114 09/09/17 0423  WBC 16.2* 10.0  NEUTROABS 13.9* 7.4*  HGB 9.9* 9.3*  HCT 29.9* 27.8*  MCV 84.7 86.2  PLT 277 270      Lab Results  Component Value Date   HEPBSAG Negative 11/14/2016   HEPBSAB Non Reactive 08/17/2016   HEPBIGM Negative 08/26/2016      Microbiology:  Recent Results (from the past 240 hour(s))  Blood culture (routine x 2)     Status: None (Preliminary result)   Collection Time:  09/08/17  2:08 AM  Result Value Ref Range Status   Specimen Description BLOOD RIGHT ANTECUBITAL  Final   Special Requests   Final    BOTTLES DRAWN AEROBIC AND ANAEROBIC Blood Culture adequate volume   Culture NO GROWTH 2 DAYS  Final   Report Status PENDING  Incomplete  Blood culture (routine x 2)     Status: None (Preliminary result)   Collection Time: 09/08/17  2:08 AM  Result Value Ref Range Status   Specimen Description BLOOD BLOOD RIGHT FOREARM  Final   Special Requests   Final    BOTTLES DRAWN AEROBIC AND ANAEROBIC Blood Culture adequate volume   Culture NO GROWTH 2 DAYS  Final   Report Status PENDING  Incomplete  MRSA PCR Screening     Status: None   Collection Time: 09/08/17  4:28 AM  Result Value Ref Range Status   MRSA by PCR NEGATIVE NEGATIVE Final  Comment:        The GeneXpert MRSA Assay (FDA approved for NASAL specimens only), is one component of a comprehensive MRSA colonization surveillance program. It is not intended to diagnose MRSA infection nor to guide or monitor treatment for MRSA infections.   Culture, respiratory (NON-Expectorated)     Status: None (Preliminary result)   Collection Time: 09/08/17  5:48 PM  Result Value Ref Range Status   Specimen Description INDUCED SPUTUM  Final   Special Requests NONE  Final   Gram Stain   Final    FEW WBC PRESENT, PREDOMINANTLY PMN FEW GRAM NEGATIVE RODS Performed at [facility], [address], [place], [place] [address]    Culture PENDING  Incomplete   Report Status PENDING  Incomplete    Coagulation Studies: No results for input(s): LABPROT, INR in the last 72 hours.  Urinalysis:  Recent Labs  09/09/17 1056  COLORURINE YELLOW*  LABSPEC 1.013  PHURINE 7.0  GLUCOSEU NEGATIVE  HGBUR NEGATIVE  BILIRUBINUR NEGATIVE  KETONESUR NEGATIVE  PROTEINUR 100*  NITRITE NEGATIVE  LEUKOCYTESUR MODERATE*      Imaging: Dg Chest Port 1 View  Result Date: 09/09/2017 CLINICAL DATA:  Shortness of  breath. EXAM: PORTABLE CHEST 1 VIEW COMPARISON:  09/08/2017 FINDINGS: Tracheostomy appears unchanged. Chronic cardiomegaly with mitral or tricuspid valve replacement. Patchy atelectasis and or infiltrate in both lower lobes. Upper lungs are clear. Tiny amount of pleural fluid on the right. IMPRESSION: Patchy atelectasis and or infiltrate in both lower lobes. Small amount of pleural fluid on the right. Upper lobes clear. Electronically Signed   By: Nelson Chimes M.D.   On: 09/09/2017 11:49     Medications:   . ceFEPime (MAXIPIME) IV Stopped (09/10/17 0123)   . albuterol  2.5 mg Nebulization TID PC & HS  . amiodarone  200 mg Oral Daily  . apixaban  2.5 mg Oral BID  . atorvastatin  80 mg Oral q1800  . docusate sodium  100 mg Oral BID  . epoetin (EPOGEN/PROCRIT) injection  4,000 Units Intravenous Q M,W,F-HD  . furosemide  40 mg Oral Daily  . gabapentin  100 mg Oral BID  . insulin aspart  0-9 Units Subcutaneous TID WC  . insulin glargine  8 Units Subcutaneous QHS  . Melatonin  5 mg Oral QHS  . metoCLOPramide  5 mg Oral BID WC  . metoprolol tartrate  12.5 mg Oral BID  . pantoprazole  40 mg Oral BID  . 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 End-stage renal disease on HD MWF 2. Anemia of chronic kidney disease, hgb 9.3 3. Cough, pneumonia, fever 4. Acute pulmonary edema 5. Secondary hyperparathyroidism, phosphorus 3.2.   Plan: Patient due for hemodialysis today. Orders have been prepared. We will continue dialysis on a Monday, Wednesday,  Friday schedule. We will maintain the patient on Epogen 4000 units IV with dialysis for her anemia. We will also continue to periodically monitor bone marrow metabolism parameters. Most recent serum phosphorus as above was 3.2.   LOS: 2 Eltha Tingley, [place] 10/8/201810:49 AM  [facility] [place], [place]

## 2017-09-10 NOTE — Progress Notes (Signed)
Pt discharged per MD order. IV removed. Discharge paperwork reviewed with pt. Prescription given to pt.  Questions answered to pts satisfaction. Pt taken downstairs in wheelchair pushed by staff.

## 2017-09-11 LAB — CULTURE, RESPIRATORY W GRAM STAIN

## 2017-09-11 LAB — URINE CULTURE

## 2017-09-12 ENCOUNTER — Other Ambulatory Visit: Payer: Self-pay | Admitting: Physician Assistant

## 2017-09-12 NOTE — Discharge Summary (Signed)
[facility] [place] NAME: Deborah [name]    MR#:  [id number]  DATE OF BIRTH:  Feb 28, 1956  DATE OF ADMISSION:  09/08/2017 ADMITTING PHYSICIAN: Harrie Foreman, MD  DATE OF DISCHARGE: 09/10/2017  5:31 PM  PRIMARY CARE PHYSICIAN: System, Pcp Not In   ADMISSION DIAGNOSIS:  Cough [R05] ESRD on hemodialysis ([place]) [N18.6, Z99.2] HCAP (healthcare-associated pneumonia) [J18.9] Fever, unspecified fever cause [R50.9]  DISCHARGE DIAGNOSIS:  Active Problems:   Sepsis ([place])   SECONDARY DIAGNOSIS:   Past Medical History:  Diagnosis Date  . Diabetes mellitus   . Hyperlipidemia   . Hypertension   . MI, old   . Renal disorder    dialysls     ADMITTING HISTORY  Chief Complaint: Cough HPI: The patient with past medical history of respiratory failure status post trach placement, coronary artery disease, hypertension, diabetes and ESRD presents emergency department complaining of cough. The patient developed a nonproductive cough today. She reports subjective fevers and was found to be febrile in the emergency department to 101.39F. The patient has recent history of an 21-monthhospitalization secondary to respiratory failure. Chest x-ray today shows pulmonary edema with possible infiltrate. Oxygen saturations have been normal on room air but the patient has persistent tachypnea. She was given cefepime and vancomycin in the emergency department as well as breathing treatments. Due to her state of sepsis the emergency department staff called the hospitalist service for further management.  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 Initially needed increased oxygen which was slowly weaned off. She had  dialysis 2 sessions during the hospital stay with significant fluid removed. With this her shortness of breath improved well. Patient was also found to be febrile with elevated white count and bilateral pneumonitis. Initially started on broad-spectrum IV antibiotics. Cultures remain negative. MRSA PCR negative. She is being discharged home on Augmentin. Other medications remain the same. Patient is being discharged home in stable condition to follow-up with her primary care physician in a week.  CONSULTS OBTAINED:  Treatment Team:  SMurlean Iba MD  DRUG ALLERGIES:  No Known Allergies  DISCHARGE MEDICATIONS:   Discharge Medication List as of 09/10/2017  3:39 PM    START taking these medications   Details  amoxicillin-clavulanate (AUGMENTIN) 875-125 MG tablet Take 1 tablet by mouth 2 (two) times daily., Starting Sun 09/09/2017, Print      CONTINUE these medications which have NOT CHANGED   Details  acetaminophen (TYLENOL) 325 MG tablet Take 650 mg by mouth., Starting Fri 04/06/2017, Historical Med    !! albuterol (PROVENTIL) (2.5 MG/3ML) 0.083% nebulizer solution Take 2.5 mg by nebulization every 6 (six) hours as needed. , Historical Med    ALPRAZolam (XANAX) 0.25 MG tablet Take 1 tablet (0.25 mg total) by mouth 2 (two) times daily as needed for anxiety., Starting Wed 11/01/2016, Print    amiodarone (PACERONE) 200 MG tablet Take 1 tablet (200 mg total) by mouth daily., Starting Wed 11/01/2016, Normal    apixaban (ELIQUIS) 2.5 MG TABS tablet Take 2.5 mg by mouth 2 (two) times daily. , Starting Fri 06/22/2017, Historical Med    atorvastatin (LIPITOR) 80 MG tablet Take 80 mg by mouth., Historical Med    B Complex-C-Folic Acid (VOL-CARE RX PO) Take  1 tablet by mouth daily., Historical Med    furosemide (LASIX) 40 MG tablet Take 40 mg by mouth daily. ON mornings of dialysis, Historical Med    gabapentin (NEURONTIN) 100 MG capsule Take 1 capsule (100 mg total) by mouth 2 (two) times daily.,  Starting Mon 08/13/2017, Phone In    insulin glargine (LANTUS) 100 UNIT/ML injection Inject 14 Units into the skin at bedtime., Historical Med    Melatonin 3 MG TABS Take 6 mg by mouth., Starting Fri 04/06/2017, Historical Med    metoCLOPramide (REGLAN) 5 MG tablet Take 1 tablet (5 mg total) by mouth 2 (two) times daily with a meal., Starting Wed 11/01/2016, Normal    metoprolol tartrate (LOPRESSOR) 25 MG tablet Take 12.5 mg by mouth. Take 1/2 tablet by mouth in the morning and 1/2 tablet by mouth in the evening., Starting Fri 06/22/2017, Historical Med    pantoprazole (PROTONIX) 40 MG tablet Take 1 tablet (40 mg total) by mouth 2 (two) times daily., Starting Wed 11/01/2016, Normal    QUEtiapine (SEROQUEL) 50 MG tablet Take 50 mg by mouth., Starting Fri 04/06/2017, Historical Med    sodium chloride HYPERTONIC 3 % nebulizer solution Inhale 4 mL by nebulization every 6 (six) hours., Historical Med    !! albuterol (PROVENTIL) (2.5 MG/3ML) 0.083% nebulizer solution Take 3 mLs (2.5 mg total) by nebulization 4 (four) times daily - after meals and at bedtime., Starting Wed 11/01/2016, Normal    Blood Glucose Monitoring Suppl (GLUCOCOM BLOOD GLUCOSE MONITOR) DEVI Use to check blood sugars 4-6 times per day Disp per insurance company kit ICD-10 E11.9, Historical Med    NONFORMULARY OR COMPOUNDED ITEM See pharmacy note, No Print     !! - Potential duplicate medications found. Please discuss with provider.    STOP taking these medications     cephALEXin (KEFLEX) 500 MG capsule      clopidogrel (PLAVIX) 75 MG tablet      Darbepoetin Alfa (ARANESP) 100 MCG/0.5ML SOSY injection      guaiFENesin (ROBITUSSIN) 100 MG/5ML SOLN      hydrocortisone (ANUSOL-HC) 2.5 % rectal cream      Insulin Detemir (LEVEMIR) 100 UNIT/ML Pen      pregabalin (LYRICA) 100 MG capsule      warfarin (COUMADIN) 2 MG tablet      warfarin (COUMADIN) 4 MG tablet         Today   VITAL SIGNS:  Blood pressure (!) 130/57,  pulse 70, temperature 98.2 F (36.8 C), temperature source Oral, resp. rate 18, height 5' 1"  (1.549 m), weight 64.1 kg (141 lb 5 oz), SpO2 97 %.  I/O:  No intake or output data in the 24 hours ending 09/12/17 1435  PHYSICAL EXAMINATION:  Physical Exam  GENERAL:  61 y.o.-year-old patient lying in the bed with no acute distress.  LUNGS: Normal breath sounds bilaterally, no wheezing, rales,rhonchi or crepitation. No use of accessory muscles of respiration.  CARDIOVASCULAR: S1, S2 normal. No murmurs, rubs, or gallops.  ABDOMEN: Soft, non-tender, non-distended. Bowel sounds present. No organomegaly or mass.  NEUROLOGIC: Moves all 4 extremities. PSYCHIATRIC: The patient is alert and oriented x 3.  SKIN: No obvious rash, lesion, or ulcer.   DATA REVIEW:   CBC  Recent Labs Lab 09/09/17 0423  WBC 10.0  HGB 9.3*  HCT 27.8*  PLT 270    Chemistries   Recent Labs Lab 09/08/17 0114  NA 134*  K 3.7  CL 98*  CO2 24  GLUCOSE 288*  BUN 19  CREATININE 3.14*  CALCIUM 8.5*  AST 22  ALT 33  ALKPHOS 117  BILITOT 1.8*    Cardiac Enzymes No results for input(s): TROPONINI in the last 168 hours.  Microbiology Results  Results for orders placed or performed during the hospital encounter of 09/08/17  Blood culture (routine x 2)     Status: None (Preliminary result)   Collection Time: 09/08/17  2:08 AM  Result Value Ref Range Status   Specimen Description BLOOD RIGHT ANTECUBITAL  Final   Special Requests   Final    BOTTLES DRAWN AEROBIC AND ANAEROBIC Blood Culture adequate volume   Culture NO GROWTH 4 DAYS  Final   Report Status PENDING  Incomplete  Blood culture (routine x 2)     Status: None (Preliminary result)   Collection Time: 09/08/17  2:08 AM  Result Value Ref Range Status   Specimen Description BLOOD BLOOD RIGHT FOREARM  Final   Special Requests   Final    BOTTLES DRAWN AEROBIC AND ANAEROBIC Blood Culture adequate volume   Culture NO GROWTH 4 DAYS  Final   Report  Status PENDING  Incomplete  MRSA PCR Screening     Status: None   Collection Time: 09/08/17  4:28 AM  Result Value Ref Range Status   MRSA by PCR NEGATIVE NEGATIVE Final    Comment:        The GeneXpert MRSA Assay (FDA approved for NASAL specimens only), is one component of a comprehensive MRSA colonization surveillance program. It is not intended to diagnose MRSA infection nor to guide or monitor treatment for MRSA infections.   Culture, respiratory (NON-Expectorated)     Status: None   Collection Time: 09/08/17  5:48 PM  Result Value Ref Range Status   Specimen Description INDUCED SPUTUM  Final   Special Requests NONE  Final   Gram Stain   Final    FEW WBC PRESENT, PREDOMINANTLY PMN FEW GRAM NEGATIVE RODS Performed at [facility], [address], [place], [place] [address]    Culture MODERATE STENOTROPHOMONAS MALTOPHILIA  Final   Report Status 09/11/2017 FINAL  Final   Organism ID, Bacteria STENOTROPHOMONAS MALTOPHILIA  Final      Susceptibility   Stenotrophomonas maltophilia - MIC*    LEVOFLOXACIN 0.5 SENSITIVE Sensitive     TRIMETH/SULFA <=20 SENSITIVE Sensitive     * MODERATE STENOTROPHOMONAS MALTOPHILIA  Urine Culture     Status: Abnormal   Collection Time: 09/09/17  6:04 PM  Result Value Ref Range Status   Specimen Description URINE, RANDOM  Final   Special Requests NONE  Final   Culture MULTIPLE SPECIES PRESENT, SUGGEST RECOLLECTION (A)  Final   Report Status 09/11/2017 FINAL  Final    RADIOLOGY:  No results found.  Follow up with PCP in 1 week.  Management plans discussed with the patient, family and they are in agreement.  CODE STATUS:  Code Status History    Date Active Date Inactive Code Status Order ID Comments User Context   09/08/2017  4:19 AM 09/10/2017  8:37 PM Full Code [id number]  Harrie Foreman, MD Inpatient   11/14/2016  1:47 PM 11/26/2016  5:31 PM Full Code [id number]  Corrie Mckusick, DO Inpatient   11/02/2016  9:16 PM 11/14/2016   1:47 PM Full Code [id number]  Corey Harold, NP ED   10/11/2016  4:35 PM 11/01/2016 10:00 PM Full Code [id number]  Bary Leriche, PA-C Inpatient   10/11/2016  4:35 PM 10/11/2016  4:35 PM Full Code [id number]  Flora Lipps Inpatient   08/18/2016  2:56 PM 08/31/2016  8:38 AM Full Code [id number]  Shirley Friar, PA-C Inpatient   08/17/2016  7:58 AM 08/18/2016 11:51 AM Full Code [id number]  Flora Lipps, MD Inpatient   08/15/2016  4:16 PM 08/15/2016  7:06 PM Full Code [id number]  Yolonda Kida, MD Inpatient      TOTAL TIME TAKING CARE OF THIS PATIENT ON DAY OF DISCHARGE: more than 30 minutes.   [name] R M.D on 09/12/2017 at 2:35 PM  Between 7am to 6pm - Pager - [phone]  After 6pm go to [email or web address] - password [email or web address] [facility]  Office  [phone]  CC: Primary care physician; System, Pcp Not In  Note: This dictation was prepared with Dragon dictation along with smaller phrase technology. Any transcriptional errors that result from this process are unintentional.

## 2017-09-13 LAB — CULTURE, BLOOD (ROUTINE X 2)
CULTURE: NO GROWTH
Culture: NO GROWTH
SPECIAL REQUESTS: ADEQUATE
Special Requests: ADEQUATE

## 2017-09-25 IMAGING — CR DG CHEST 1V PORT
1 series · 1 of 1 positions shown · non-contrast
Comparison: Portable chest x-ray earlier today [DATE] p.m. and
previously.

CLINICAL DATA: Placement of femoral Swan-Ganz catheter and
intra-aortic balloon pump catheter.

EXAM:
PORTABLE CHEST 1 VIEW [DATE] p.m.:

[AP]
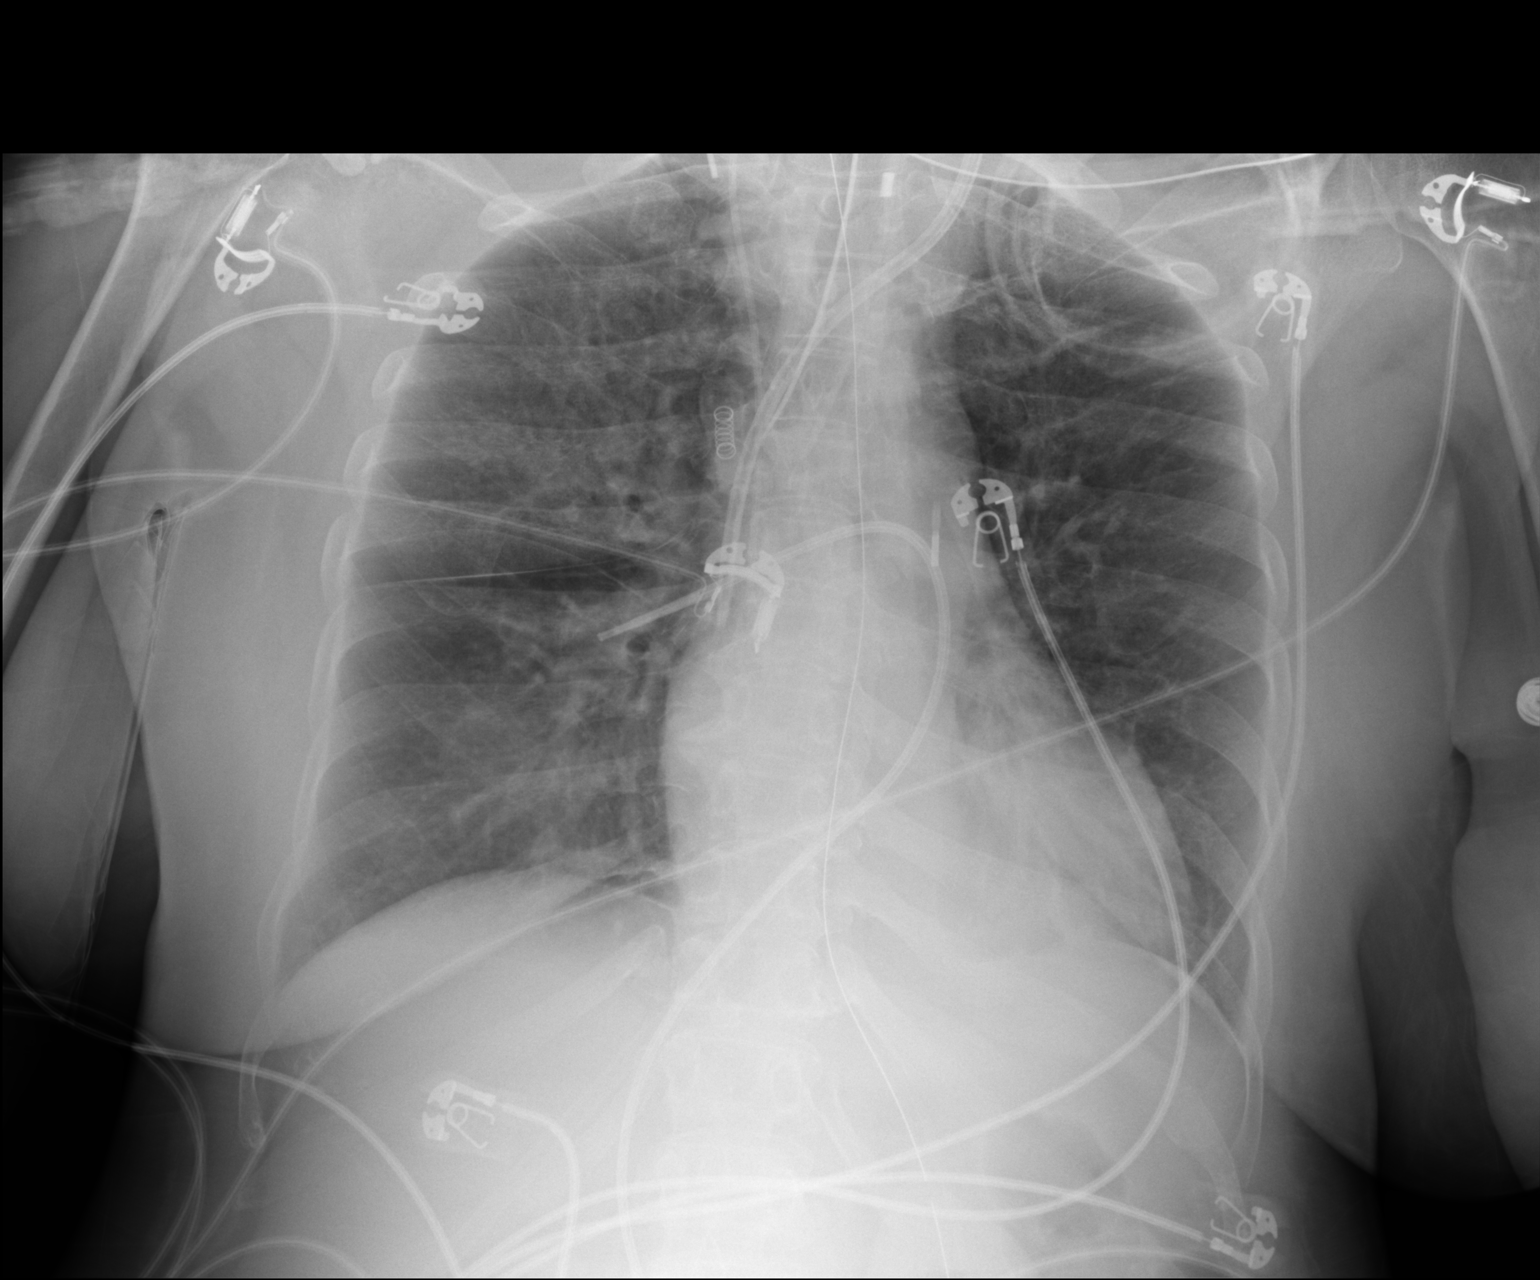

[1 of 1 positions shown; findings below may reference images not displayed]

FINDINGS: Femoral Swan-Ganz catheter tip projects at the expected location of
the right middle lobe pulmonary artery. Intra-aortic balloon pump
catheter tip projects over the proximal descending thoracic aorta.

Endotracheal tube tip now projects approximately 2-3 cm above the
carina. Right jugular central venous catheter tip projects over the
upper SVC. Left jugular central venous catheter tip projects over
the lower SVC. Nasogastric tube courses below the diaphragm into the
stomach.

Improved aeration in the left lower lobe since the most recent
examination 45 minutes earlier, though streaky opacities persist.
Improved perihilar airspace pulmonary edema in both lungs. No new
pulmonary parenchymal abnormalities.
IMPRESSION: 1. Swan-Ganz catheter tip projects at the expected location of the
right middle lobe pulmonary artery.
2. Intra-aortic balloon pump catheter tip projects over the proximal
descending thoracic aorta.
3. Remaining support apparatus satisfactory.
4. Improved aeration in both lungs since the examination performed
45 minutes ago, although streaky left lower lobe atelectasis and/or
pneumonia and mild perihilar airspace pulmonary edema persists.
5. No new abnormalities.

## 2017-09-26 IMAGING — CR DG CHEST 1V PORT
1 series · 1 of 1 positions shown · non-contrast
Comparison: 08/18/2016

CLINICAL DATA: Pulmonary edema

EXAM:
PORTABLE CHEST 1 VIEW

[AP]
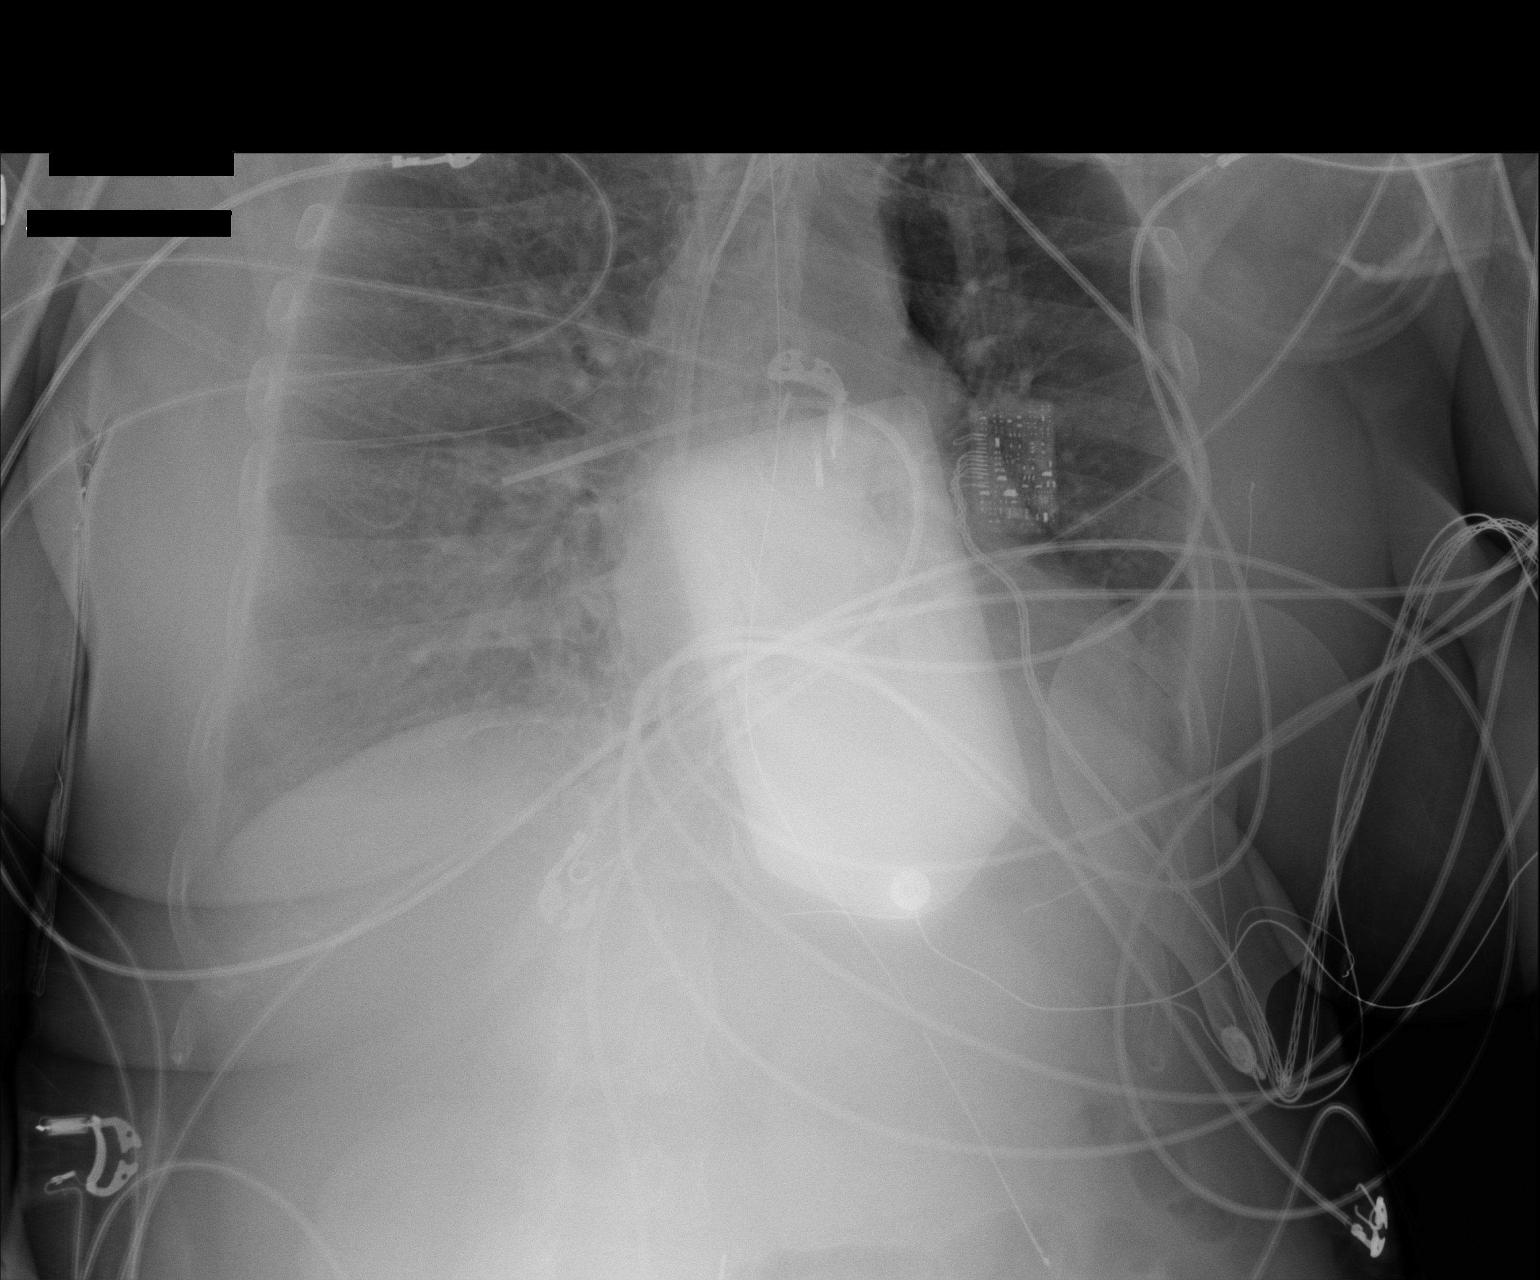

[1 of 1 positions shown; findings below may reference images not displayed]

FINDINGS: External pad artifact. Endotracheal tube, central venous lines, and
NG tube unchanged. Swan-Ganz catheter and intra aortic balloon pump
unchanged. Stable cardiac silhouette. Mild central venous pulmonary
congestion. No overt pulmonary edema. No pneumothorax.
IMPRESSION: 1. Stable support apparatus.
2. Central venous congestion.  No significant change.

## 2017-09-27 IMAGING — CR DG CHEST 1V PORT
1 series · 1 of 1 positions shown · non-contrast
Comparison: 08/19/2016

CLINICAL DATA: Pt was hypotensive with respiratory failure x a few
days ago. Hx of DM, and HTN. Pt had a cardiac cath on 08-15-16.

EXAM:
PORTABLE CHEST 1 VIEW

[AP]
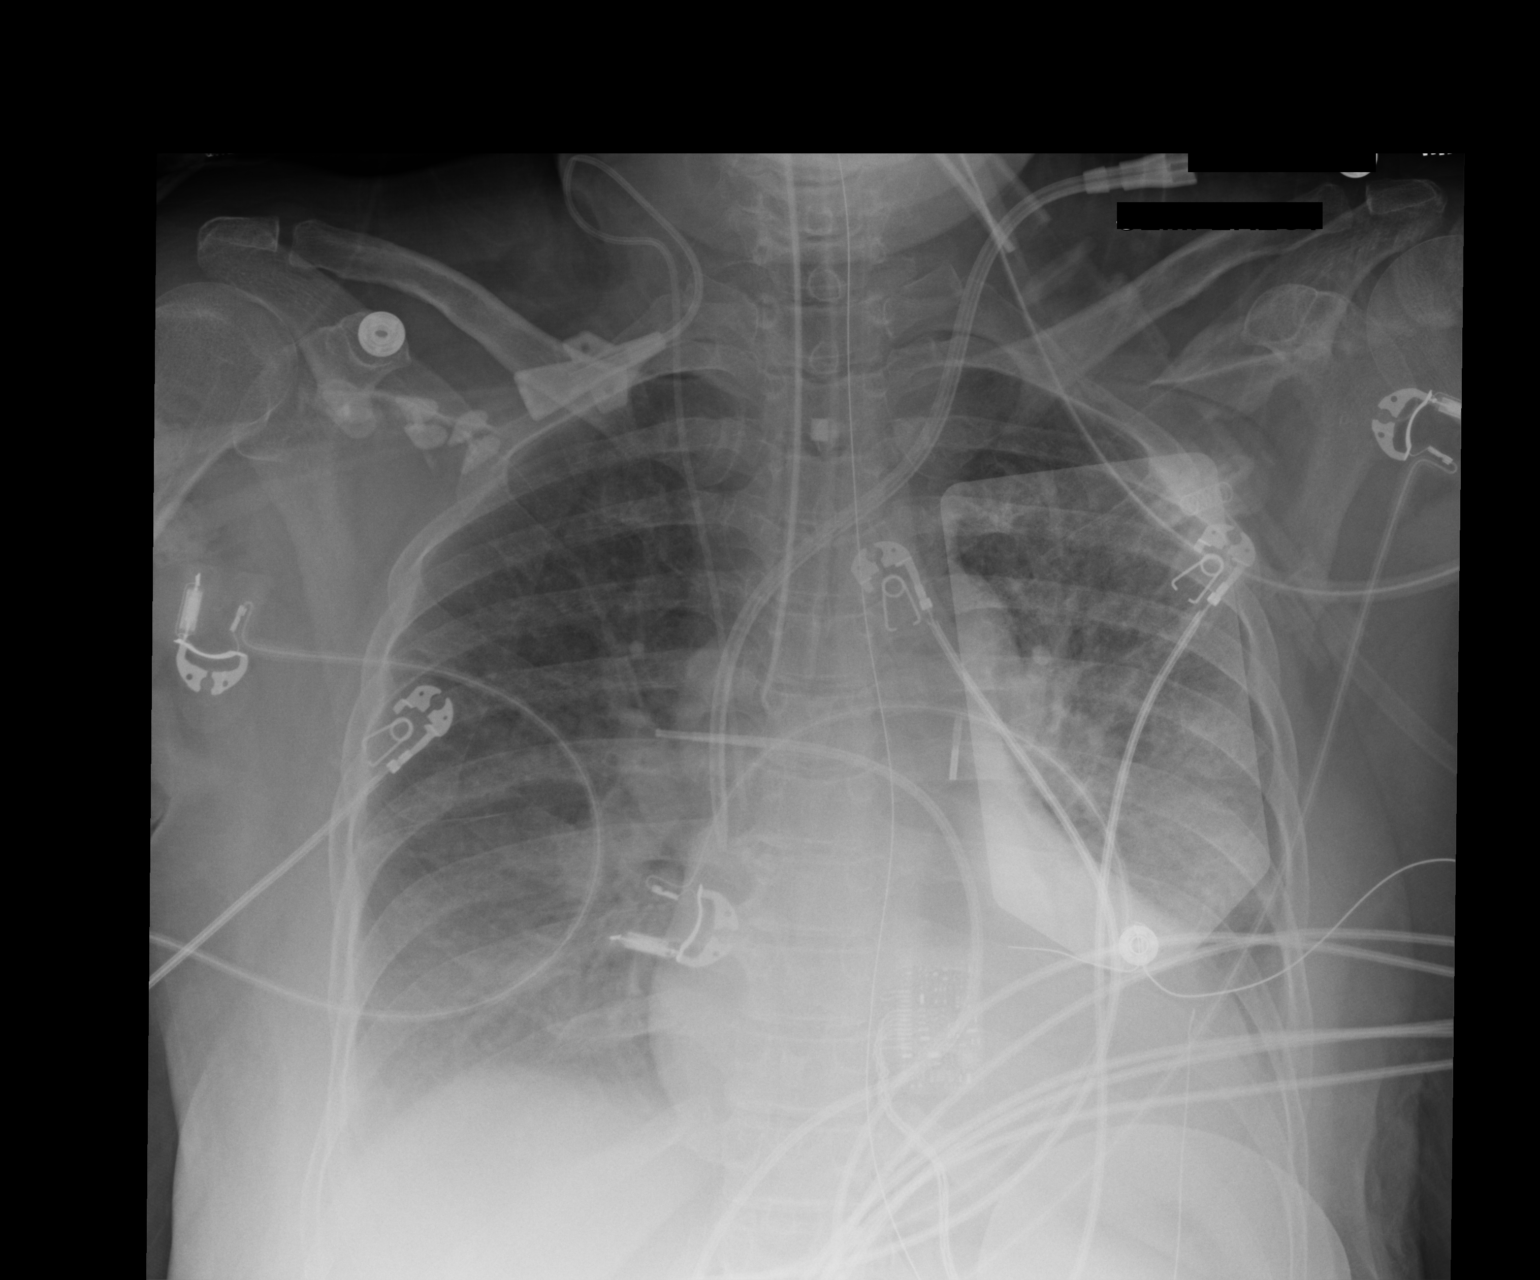

[1 of 1 positions shown; findings below may reference images not displayed]

FINDINGS: Endotracheal to appears low at the level of the carina. LEFT and
RIGHT central venous line are unchanged. Swan-Ganz catheter from an
inferior approach unchanged. There is some improvement aeration lung
bases. Mild pulmonary edema remains. No pneumothorax.
IMPRESSION: 1. Endotracheal tube appears low at the level the carina.
2. Stable support apparatus.
3. Slight improved aeration lung bases with persistent mild edema.

## 2017-09-28 IMAGING — CR DG CHEST 1V PORT
1 series · 1 of 1 positions shown · non-contrast
Comparison: 08/20/2016

CLINICAL DATA: Acute hypoxemic respiratory failure

EXAM:
PORTABLE CHEST 1 VIEW

[AP]
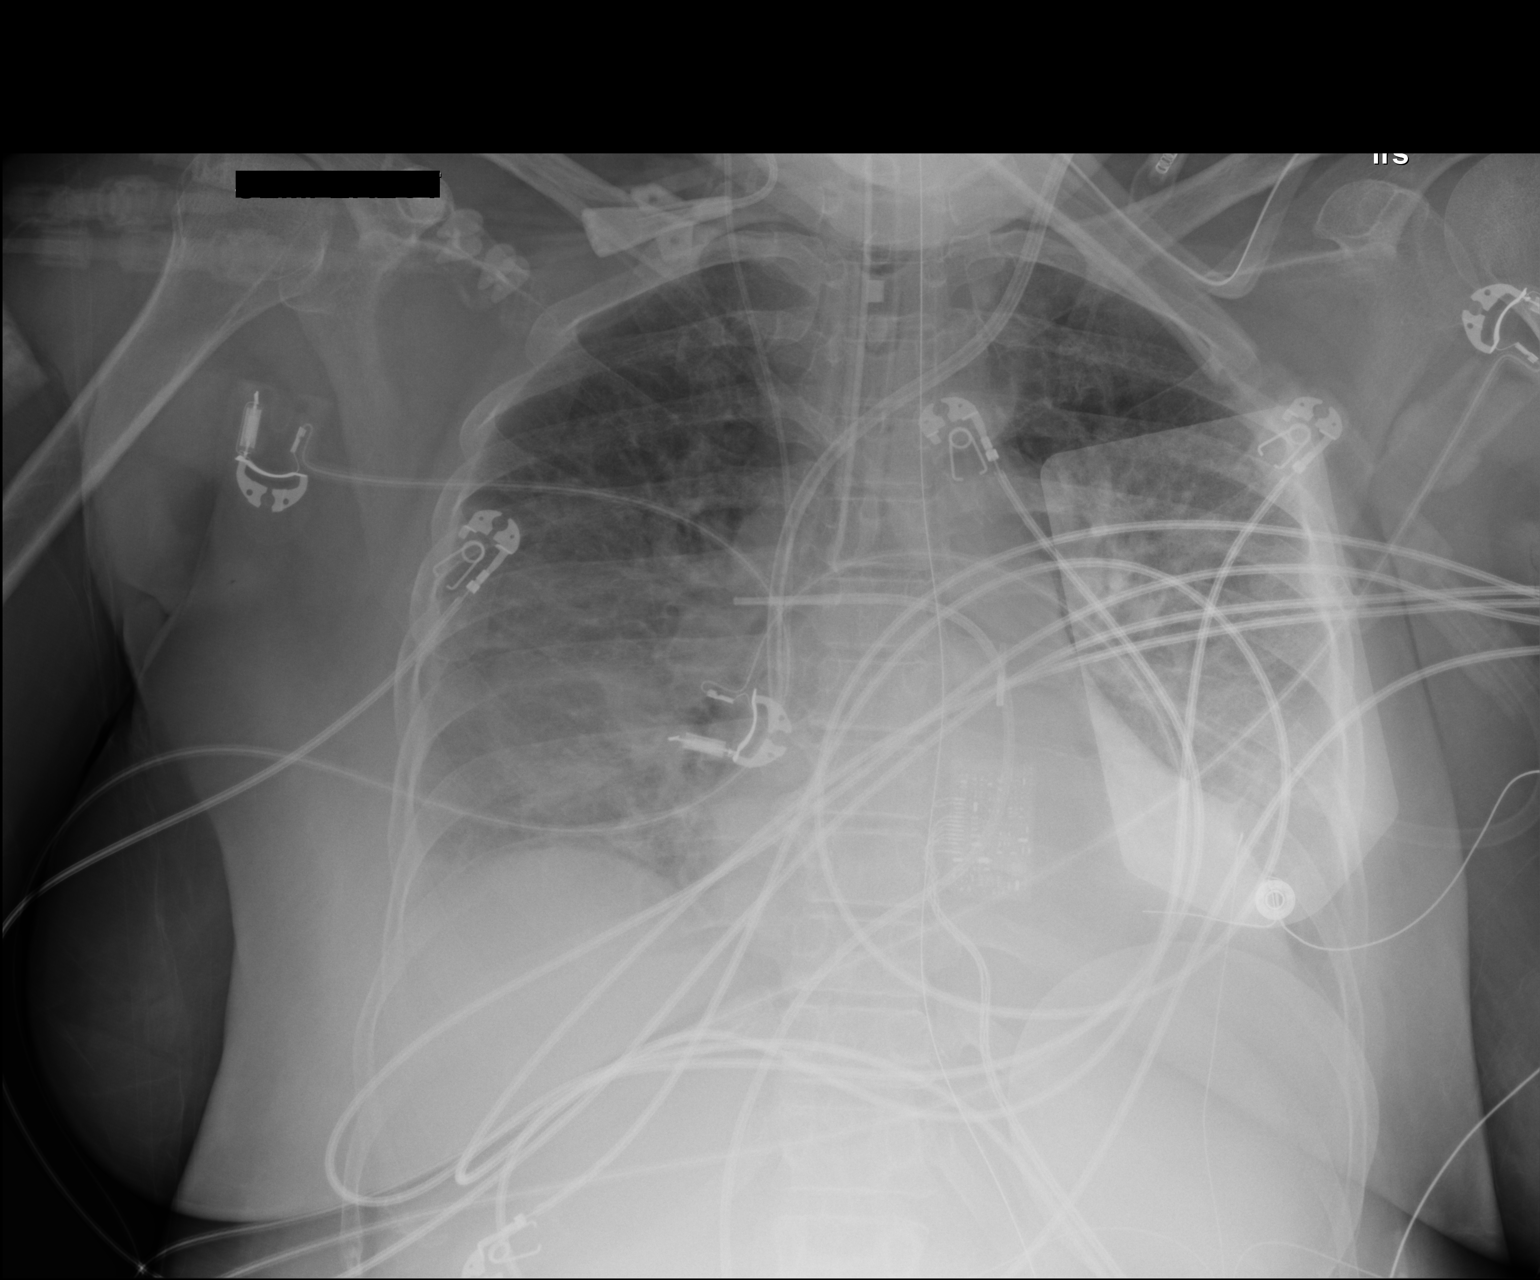

[1 of 1 positions shown; findings below may reference images not displayed]

FINDINGS: Support devices are stable. Endotracheal tube remains at the level
of the carina. Borderline heart size. Bilateral airspace opacities
are again noted, likely edema. Findings stable or slightly worsened
since prior study. Possible small right effusion.
IMPRESSION: Slight interval worsening in pulmonary edema pattern.

Small right effusion.

Endotracheal tube remains at the level of the carina and could be
retracted 2-3 cm for optimal positioning.

## 2017-09-29 IMAGING — DX DG ABD PORTABLE 1V
1 series · 1 of 1 positions shown · non-contrast
Comparison: Chest x-ray 08/22/2016.  KUB 08/17/2016

CLINICAL DATA: Balloon pump position

EXAM:
PORTABLE ABDOMEN - 1 VIEW

[abdomen supine]
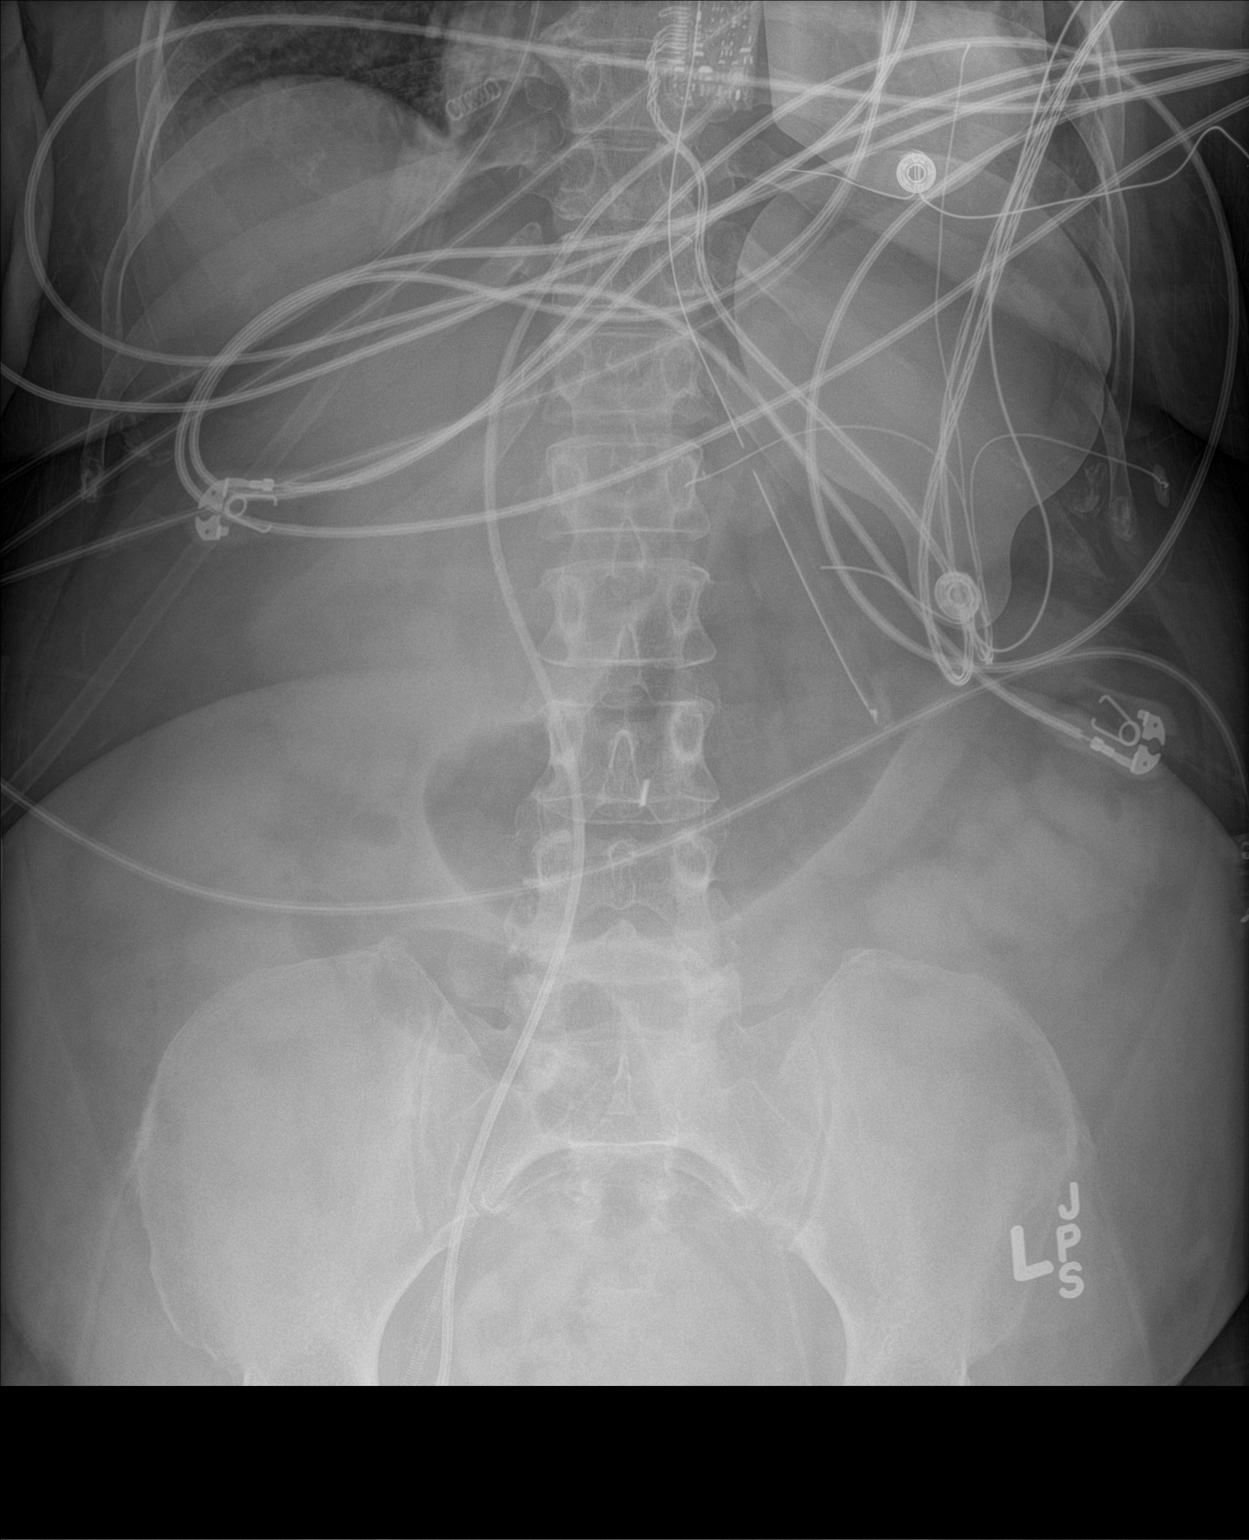

[1 of 1 positions shown; findings below may reference images not displayed]

FINDINGS: Right femoral Swan-Ganz catheter extends above the diaphragm .

Intra-aortic balloon pump marker appears overlying the L3 vertebral
body in previously was in the thoracic aorta.

Left femoral venous catheter in the left iliac vein.

NG tube in the stomach.

Normal bowel gas pattern.
IMPRESSION: Intra-aortic balloon pump marker  overlies the L3 vertebral body.

## 2017-10-01 ENCOUNTER — Emergency Department: Payer: Medicaid Other

## 2017-10-01 ENCOUNTER — Inpatient Hospital Stay
Admission: EM | Admit: 2017-10-01 | Discharge: 2017-10-03 | DRG: 871 | Disposition: A | Discharge status: Home Health | Payer: Medicaid Other | Attending: Internal Medicine | Admitting: Internal Medicine

## 2017-10-01 DIAGNOSIS — J189 Pneumonia, unspecified organism: Secondary | ICD-10-CM | POA: Diagnosis present

## 2017-10-01 DIAGNOSIS — Z992 Dependence on renal dialysis: Secondary | ICD-10-CM

## 2017-10-01 DIAGNOSIS — I2510 Atherosclerotic heart disease of native coronary artery without angina pectoris: Secondary | ICD-10-CM | POA: Diagnosis present

## 2017-10-01 DIAGNOSIS — E1122 Type 2 diabetes mellitus with diabetic chronic kidney disease: Secondary | ICD-10-CM | POA: Diagnosis present

## 2017-10-01 DIAGNOSIS — Z8701 Personal history of pneumonia (recurrent): Secondary | ICD-10-CM | POA: Diagnosis not present

## 2017-10-01 DIAGNOSIS — I5033 Acute on chronic diastolic (congestive) heart failure: Secondary | ICD-10-CM | POA: Diagnosis present

## 2017-10-01 DIAGNOSIS — Y95 Nosocomial condition: Secondary | ICD-10-CM | POA: Diagnosis present

## 2017-10-01 DIAGNOSIS — Z952 Presence of prosthetic heart valve: Secondary | ICD-10-CM

## 2017-10-01 DIAGNOSIS — I132 Hypertensive heart and chronic kidney disease with heart failure and with stage 5 chronic kidney disease, or end stage renal disease: Secondary | ICD-10-CM | POA: Diagnosis present

## 2017-10-01 DIAGNOSIS — Z955 Presence of coronary angioplasty implant and graft: Secondary | ICD-10-CM | POA: Diagnosis not present

## 2017-10-01 DIAGNOSIS — E876 Hypokalemia: Secondary | ICD-10-CM | POA: Diagnosis present

## 2017-10-01 DIAGNOSIS — Z8249 Family history of ischemic heart disease and other diseases of the circulatory system: Secondary | ICD-10-CM | POA: Diagnosis not present

## 2017-10-01 DIAGNOSIS — E785 Hyperlipidemia, unspecified: Secondary | ICD-10-CM | POA: Diagnosis present

## 2017-10-01 DIAGNOSIS — N186 End stage renal disease: Secondary | ICD-10-CM | POA: Diagnosis present

## 2017-10-01 DIAGNOSIS — N2581 Secondary hyperparathyroidism of renal origin: Secondary | ICD-10-CM | POA: Diagnosis present

## 2017-10-01 DIAGNOSIS — Z794 Long term (current) use of insulin: Secondary | ICD-10-CM | POA: Diagnosis not present

## 2017-10-01 DIAGNOSIS — Z7902 Long term (current) use of antithrombotics/antiplatelets: Secondary | ICD-10-CM

## 2017-10-01 DIAGNOSIS — Z930 Tracheostomy status: Secondary | ICD-10-CM

## 2017-10-01 DIAGNOSIS — Z7901 Long term (current) use of anticoagulants: Secondary | ICD-10-CM

## 2017-10-01 DIAGNOSIS — D631 Anemia in chronic kidney disease: Secondary | ICD-10-CM | POA: Diagnosis present

## 2017-10-01 DIAGNOSIS — Z79899 Other long term (current) drug therapy: Secondary | ICD-10-CM | POA: Diagnosis not present

## 2017-10-01 DIAGNOSIS — I252 Old myocardial infarction: Secondary | ICD-10-CM

## 2017-10-01 DIAGNOSIS — R05 Cough: Secondary | ICD-10-CM | POA: Diagnosis present

## 2017-10-01 DIAGNOSIS — A419 Sepsis, unspecified organism: Secondary | ICD-10-CM | POA: Diagnosis present

## 2017-10-01 DIAGNOSIS — I4891 Unspecified atrial fibrillation: Secondary | ICD-10-CM | POA: Diagnosis present

## 2017-10-01 DIAGNOSIS — J159 Unspecified bacterial pneumonia: Secondary | ICD-10-CM | POA: Diagnosis present

## 2017-10-01 DIAGNOSIS — Z833 Family history of diabetes mellitus: Secondary | ICD-10-CM

## 2017-10-01 LAB — CBC WITH DIFFERENTIAL/PLATELET
Basophils Absolute: 0.1 10*3/uL (ref 0–0.1)
Basophils Relative: 1 %
EOS PCT: 0 %
Eosinophils Absolute: 0.1 10*3/uL (ref 0–0.7)
HEMATOCRIT: 29.5 % — AB (ref 35.0–47.0)
Hemoglobin: 9.8 g/dL — ABNORMAL LOW (ref 12.0–16.0)
LYMPHS PCT: 5 %
Lymphs Abs: 0.9 10*3/uL — ABNORMAL LOW (ref 1.0–3.6)
MCH: 29.3 pg (ref 26.0–34.0)
MCHC: 33 g/dL (ref 32.0–36.0)
MCV: 88.6 fL (ref 80.0–100.0)
MONO ABS: 1.7 10*3/uL — AB (ref 0.2–0.9)
Monocytes Relative: 10 %
Neutro Abs: 13.6 10*3/uL — ABNORMAL HIGH (ref 1.4–6.5)
Neutrophils Relative %: 84 %
PLATELETS: 261 10*3/uL (ref 150–440)
RBC: 3.33 MIL/uL — ABNORMAL LOW (ref 3.80–5.20)
RDW: 16.9 % — AB (ref 11.5–14.5)
WBC: 16.3 10*3/uL — AB (ref 3.6–11.0)

## 2017-10-01 LAB — BASIC METABOLIC PANEL
Anion gap: 12 (ref 5–15)
BUN: 13 mg/dL (ref 6–20)
CALCIUM: 9 mg/dL (ref 8.9–10.3)
CO2: 29 mmol/L (ref 22–32)
Chloride: 95 mmol/L — ABNORMAL LOW (ref 101–111)
Creatinine, Ser: 2.76 mg/dL — ABNORMAL HIGH (ref 0.44–1.00)
GFR calc Af Amer: 20 mL/min — ABNORMAL LOW (ref >60)
GFR, EST NON AFRICAN AMERICAN: 18 mL/min — AB (ref >60)
GLUCOSE: 124 mg/dL — AB (ref 65–99)
POTASSIUM: 3.3 mmol/L — AB (ref 3.5–5.1)
SODIUM: 136 mmol/L (ref 135–145)

## 2017-10-01 LAB — LACTIC ACID, PLASMA
LACTIC ACID, VENOUS: 1 mmol/L (ref 0.5–1.9)
Lactic Acid, Venous: 0.7 mmol/L (ref 0.5–1.9)

## 2017-10-01 LAB — GLUCOSE, CAPILLARY: Glucose-Capillary: 216 mg/dL — ABNORMAL HIGH (ref 65–99)

## 2017-10-01 MED ORDER — VANCOMYCIN HCL IN DEXTROSE 1-5 GM/200ML-% IV SOLN
1000.0000 mg | Freq: Once | INTRAVENOUS | Status: DC
  Filled 2017-10-01: qty 200

## 2017-10-01 MED ORDER — POLYETHYLENE GLYCOL 3350 17 G PO PACK: 17.0000 g | PACK | Freq: Every day | ORAL | Status: DC | PRN

## 2017-10-01 MED ORDER — APIXABAN 2.5 MG PO TABS
2.5000 mg | ORAL_TABLET | Freq: Two times a day (BID) | ORAL | Status: DC
  Administered 2017-10-01 – 2017-10-03 (×4): 2.5 mg via ORAL
  Filled 2017-10-01 (×4): qty 1

## 2017-10-01 MED ORDER — PANTOPRAZOLE SODIUM 40 MG PO TBEC
40.0000 mg | DELAYED_RELEASE_TABLET | Freq: Two times a day (BID) | ORAL | Status: DC
  Administered 2017-10-02 – 2017-10-03 (×3): 40 mg via ORAL
  Filled 2017-10-01 (×3): qty 1

## 2017-10-01 MED ORDER — DEXTROSE 5 % IV SOLN
2.0000 g | Freq: Once | INTRAVENOUS | Status: AC
  Administered 2017-10-01: 2 g via INTRAVENOUS
  Filled 2017-10-01: qty 2

## 2017-10-01 MED ORDER — SEVELAMER CARBONATE 800 MG PO TABS
1600.0000 mg | ORAL_TABLET | Freq: Three times a day (TID) | ORAL | Status: DC
  Administered 2017-10-02 – 2017-10-03 (×5): 1600 mg via ORAL
  Filled 2017-10-01 (×5): qty 2

## 2017-10-01 MED ORDER — METOCLOPRAMIDE HCL 5 MG PO TABS
5.0000 mg | ORAL_TABLET | Freq: Two times a day (BID) | ORAL | Status: DC
  Administered 2017-10-02 – 2017-10-03 (×3): 5 mg via ORAL
  Filled 2017-10-01 (×3): qty 1

## 2017-10-01 MED ORDER — ONDANSETRON HCL 4 MG/2ML IJ SOLN: 4.0000 mg | Freq: Four times a day (QID) | INTRAMUSCULAR | Status: DC | PRN

## 2017-10-01 MED ORDER — VANCOMYCIN HCL 500 MG IV SOLR: 500.0000 mg | Freq: Once | INTRAVENOUS | Status: DC

## 2017-10-01 MED ORDER — VANCOMYCIN HCL 500 MG IV SOLR
500.0000 mg | INTRAVENOUS | Status: DC
  Administered 2017-10-03: 500 mg via INTRAVENOUS
  Filled 2017-10-01: qty 500

## 2017-10-01 MED ORDER — DEXTROSE 5 % IV SOLN
1.0000 g | INTRAVENOUS | Status: DC
  Administered 2017-10-02: 1 g via INTRAVENOUS
  Filled 2017-10-01 (×2): qty 1

## 2017-10-01 MED ORDER — ATORVASTATIN CALCIUM 20 MG PO TABS
80.0000 mg | ORAL_TABLET | Freq: Every day | ORAL | Status: DC
  Administered 2017-10-02: 80 mg via ORAL
  Filled 2017-10-01: qty 4

## 2017-10-01 MED ORDER — AMIODARONE HCL 200 MG PO TABS
200.0000 mg | ORAL_TABLET | Freq: Every day | ORAL | Status: DC
  Administered 2017-10-02 – 2017-10-03 (×2): 200 mg via ORAL
  Filled 2017-10-01 (×2): qty 1

## 2017-10-01 MED ORDER — ONDANSETRON HCL 4 MG PO TABS: 4.0000 mg | ORAL_TABLET | Freq: Four times a day (QID) | ORAL | Status: DC | PRN

## 2017-10-01 MED ORDER — GABAPENTIN 100 MG PO CAPS
100.0000 mg | ORAL_CAPSULE | Freq: Two times a day (BID) | ORAL | Status: DC
  Administered 2017-10-01 – 2017-10-03 (×4): 100 mg via ORAL
  Filled 2017-10-01 (×4): qty 1

## 2017-10-01 MED ORDER — IPRATROPIUM-ALBUTEROL 0.5-2.5 (3) MG/3ML IN SOLN
3.0000 mL | Freq: Once | RESPIRATORY_TRACT | Status: AC
  Administered 2017-10-01: 3 mL via RESPIRATORY_TRACT
  Filled 2017-10-01: qty 3

## 2017-10-01 MED ORDER — VANCOMYCIN HCL 10 G IV SOLR
1500.0000 mg | Freq: Once | INTRAVENOUS | Status: AC
  Administered 2017-10-01: 1500 mg via INTRAVENOUS
  Filled 2017-10-01: qty 1500

## 2017-10-01 MED ORDER — CLOPIDOGREL BISULFATE 75 MG PO TABS
75.0000 mg | ORAL_TABLET | Freq: Every day | ORAL | Status: DC
  Administered 2017-10-02 – 2017-10-03 (×2): 75 mg via ORAL
  Filled 2017-10-01 (×2): qty 1

## 2017-10-01 MED ORDER — MELATONIN 5 MG PO TABS
2.5000 mg | ORAL_TABLET | Freq: Every day | ORAL | Status: DC
  Administered 2017-10-01 – 2017-10-02 (×2): 5 mg via ORAL
  Filled 2017-10-01 (×3): qty 1

## 2017-10-01 MED ORDER — ALPRAZOLAM 0.25 MG PO TABS: 0.2500 mg | ORAL_TABLET | Freq: Two times a day (BID) | ORAL | Status: DC | PRN

## 2017-10-01 MED ORDER — INSULIN GLARGINE 100 UNIT/ML ~~LOC~~ SOLN
14.0000 [IU] | Freq: Every day | SUBCUTANEOUS | Status: DC
  Administered 2017-10-01 – 2017-10-02 (×2): 14 [IU] via SUBCUTANEOUS
  Filled 2017-10-01 (×3): qty 0.14

## 2017-10-01 MED ORDER — HYDRALAZINE HCL 20 MG/ML IJ SOLN: 10.0000 mg | Freq: Four times a day (QID) | INTRAMUSCULAR | Status: DC | PRN

## 2017-10-01 MED ORDER — INSULIN ASPART 100 UNIT/ML ~~LOC~~ SOLN
0.0000 [IU] | Freq: Every day | SUBCUTANEOUS | Status: DC
  Administered 2017-10-01 – 2017-10-02 (×2): 2 [IU] via SUBCUTANEOUS
  Filled 2017-10-01 (×2): qty 1

## 2017-10-01 MED ORDER — INSULIN ASPART 100 UNIT/ML ~~LOC~~ SOLN
0.0000 [IU] | Freq: Three times a day (TID) | SUBCUTANEOUS | Status: DC
  Administered 2017-10-02: 7 [IU] via SUBCUTANEOUS
  Administered 2017-10-02: 9 [IU] via SUBCUTANEOUS
  Administered 2017-10-02: 5 [IU] via SUBCUTANEOUS
  Administered 2017-10-03: 2 [IU] via SUBCUTANEOUS
  Filled 2017-10-01 (×4): qty 1

## 2017-10-01 MED ORDER — ALBUTEROL SULFATE (2.5 MG/3ML) 0.083% IN NEBU
5.0000 mg | INHALATION_SOLUTION | Freq: Once | RESPIRATORY_TRACT | Status: AC
  Administered 2017-10-01: 5 mg via RESPIRATORY_TRACT
  Filled 2017-10-01: qty 6

## 2017-10-01 MED ORDER — SODIUM CHLORIDE 0.9 % IV BOLUS (SEPSIS): 1000.0000 mL | Freq: Once | INTRAVENOUS | Status: DC

## 2017-10-01 MED ORDER — ACETAMINOPHEN 650 MG RE SUPP: 650.0000 mg | Freq: Four times a day (QID) | RECTAL | Status: DC | PRN

## 2017-10-01 MED ORDER — HEPARIN SODIUM (PORCINE) 5000 UNIT/ML IJ SOLN: 5000.0000 [IU] | Freq: Three times a day (TID) | INTRAMUSCULAR | Status: DC

## 2017-10-01 MED ORDER — BISACODYL 5 MG PO TBEC: 5.0000 mg | DELAYED_RELEASE_TABLET | Freq: Every day | ORAL | Status: DC | PRN

## 2017-10-01 MED ORDER — FUROSEMIDE 40 MG PO TABS
40.0000 mg | ORAL_TABLET | Freq: Every day | ORAL | Status: DC
  Administered 2017-10-02 – 2017-10-03 (×2): 40 mg via ORAL
  Filled 2017-10-01 (×2): qty 1

## 2017-10-01 MED ORDER — DEXTROSE 5 % IV SOLN
500.0000 mg | Freq: Once | INTRAVENOUS | Status: DC
  Filled 2017-10-01: qty 500

## 2017-10-01 MED ORDER — ACETAMINOPHEN 325 MG PO TABS: 650.0000 mg | ORAL_TABLET | Freq: Four times a day (QID) | ORAL | Status: DC | PRN

## 2017-10-01 MED ORDER — METHYLPREDNISOLONE SODIUM SUCC 125 MG IJ SOLR
125.0000 mg | Freq: Once | INTRAMUSCULAR | Status: AC
  Administered 2017-10-01: 125 mg via INTRAVENOUS
  Filled 2017-10-01: qty 2

## 2017-10-01 MED ORDER — POLYETHYLENE GLYCOL 3350 17 G PO PACK: 17.0000 g | PACK | Freq: Every day | ORAL | Status: DC

## 2017-10-01 NOTE — Progress Notes (Signed)
Pharmacy Antibiotic Note  Deborah [name] is a 61 y.o. female admitted on 10/01/2017 with PNA.  Pharmacy has been consulted for cefepime and vancomycin dosing.  Plan: 1. Cefepime 1 gm IV Q24H (give after dialysis) 2. Vancomycin 1 gm IV x 1 in ED, will give an additional 500 mg IV x 1 to make a total 1.5 gm IV x 1 load, then continue maintenance dosing of 500 mg IV Q-dialysis (MWF). Pharmacy will continue to follow and adjust as needed to maintain trough 15 to 20 mcg/mL.   Height: 5\' 1"  (154.9 cm) Weight: 141 lb (64 kg) IBW/kg (Calculated) : 47.8  Temp (24hrs), Avg:98.9 F (37.2 C), Min:98.9 F (37.2 C), Max:98.9 F (37.2 C)   Recent Labs Lab 10/01/17 1704  WBC 16.3*  CREATININE 2.76*    Estimated Creatinine Clearance: 18.6 mL/min (A) (by C-G formula based on SCr of 2.76 mg/dL (H)).    No Known Allergies  Thank you for allowing pharmacy to be a part of this patient's care.  Laural Benes, Pharm.D., BCPS Clinical Pharmacist 10/01/2017 5:53 PM

## 2017-10-01 NOTE — ED Triage Notes (Signed)
Pt brought in by [facility] from [facility] dialysis.  Pt received 3 1/2 hours of HD today with 1.3L removed.  Pt told dialysis she was SOB with cough and dialysis sent pt to ER.  Pt c/o cough x several days with worsening over past couple of days.  Denies fevers.  White thick sputum reported by pt to EMS.  Pt has trach, with no supportive oxygen therapy.  Pt O2 97% on RA per EMS.  EMS attempted to suction pt with no sputum obtained in transport.  Pt is A&Ox4, in NAD, coughing off an on during triage.

## 2017-10-01 NOTE — H&P (Signed)
[facility] NAME: Deborah [name]    MR#:  [id number]  DATE OF BIRTH:  [birth year]  DATE OF ADMISSION:  10/01/2017  PRIMARY CARE PHYSICIAN: Stark Klein   REQUESTING/REFERRING PHYSICIAN: dr Joni Fears  CHIEF COMPLAINT:   cough HISTORY OF PRESENT ILLNESS:  Deborah [name]  is a 61 y.o. female with a known history of respiratory failure status post tracheostomy about a year ago, CAD with cardiac stents, diabetes and end-stage renal disease on hemodialysis who presents to the emergency room due to cough. Patient was discharged on October 8 due to cough, fluid overload and pneumonia. She reports over the past few days she has had increasing cough, fever and chills along with some generalized weakness. She did go to dialysis today and had her usual dialysis schedule today. Chest x-ray today shows pulmonary edema and bilateral pneumonia. She has been started on cefepime, azithromycin and vancomycin emergency room. Upon discharge during last hospital stay she was discharged with Augmentin. PAST MEDICAL HISTORY:   Past Medical History:  Diagnosis Date  . Diabetes mellitus   . Hyperlipidemia   . Hypertension   . MI, old   . Renal disorder    dialysls    PAST SURGICAL HISTORY:   Past Surgical History:  Procedure Laterality Date  . AV FISTULA PLACEMENT Left 10/02/2016   Procedure: INSERTION OF ARTERIOVENOUS (AV) GORE-TEX GRAFT ARM;  Surgeon: Waynetta Sandy, MD;  Location: [place];  Service: Vascular;  Laterality: Left;  . CARDIAC CATHETERIZATION N/A 08/15/2016   Procedure: Left Heart Cath and Coronary Angiography;  Surgeon: Yolonda Kida, MD;  Location: [place] [facility];  Service: Cardiovascular;  Laterality: N/A;  . CARDIAC CATHETERIZATION N/A 08/15/2016   Procedure: Coronary Stent Intervention;  Surgeon: Yolonda Kida, MD;  Location: [place] [facility];  Service: Cardiovascular;  Laterality: N/A;  . CARDIAC CATHETERIZATION  N/A 08/18/2016   Procedure: Right Heart Cath;  Surgeon: Jolaine Artist, MD;  Location: [place] [facility];  Service: Cardiovascular;  Laterality: N/A;  . CARDIAC CATHETERIZATION N/A 08/18/2016   Procedure: IABP Insertion;  Surgeon: Jolaine Artist, MD;  Location: [place] [facility];  Service: Cardiovascular;  Laterality: N/A;  . CARDIAC CATHETERIZATION Right 08/23/2016   Procedure: CENTRAL LINE INSERTION RIGHT SUBCLAVIAN;  Surgeon: Ivin Poot, MD;  Location: [place];  Service: Open Heart Surgery;  Laterality: Right;  . ENDOVEIN HARVEST OF GREATER SAPHENOUS VEIN Left 08/23/2016   Procedure: ENDOVEIN HARVEST OF GREATER SAPHENOUS VEIN;  Surgeon: Ivin Poot, MD;  Location: [place];  Service: Open Heart Surgery;  Laterality: Left;  . INSERTION OF DIALYSIS CATHETER Bilateral 08/31/2016   Procedure: INSERTION OF DIALYSIS CATHETER LEFT INTERNAL JUGULAR VEIN & INSERTION OF TRIPLE LUMEN RIGHT INTERNAL JUGULAR VEIN;  Surgeon: Angelia Mould, MD;  Location: [place];  Service: Vascular;  Laterality: Bilateral;  . INTRAOPERATIVE TRANSESOPHAGEAL ECHOCARDIOGRAM N/A 08/23/2016   Procedure: INTRAOPERATIVE TRANSESOPHAGEAL ECHOCARDIOGRAM;  Surgeon: Ivin Poot, MD;  Location: [place];  Service: Open Heart Surgery;  Laterality: N/A;  . IR GENERIC HISTORICAL  11/13/2016   IR US GUIDE VASC ACCESS LEFT 11/13/2016 Corrie Mckusick, DO [facility]  . IR GENERIC HISTORICAL  11/13/2016   IR FLUORO GUIDE CV LINE LEFT 11/13/2016 Corrie Mckusick, DO [facility]  . IR GENERIC HISTORICAL  11/13/2016   IR GASTROSTOMY TUBE MOD SED 11/13/2016 Corrie Mckusick, DO [facility]  . MITRAL VALVE REPAIR N/A 08/23/2016   Procedure: MITRAL VALVE  REPAIR (MVR) USING 25MM EDWARDS MAGNA EASE BIOPROSTHESIS MITRAL  VALVE;  Surgeon: Ivin Poot, MD;  Location: [place];  Service: Open Heart Surgery;  Laterality: N/A;  . tracheostomy reversal    . TRACHEOSTOMY TUBE PLACEMENT N/A 11/09/2016   Procedure: TRACHEOSTOMY;  Surgeon: Melissa Montane,  MD;  Location: [place];  Service: ENT;  Laterality: N/A;  . VAGINAL DELIVERY     x 6    SOCIAL HISTORY:   Social History  Substance Use Topics  . Smoking status: Never Smoker  . Smokeless tobacco: Never Used  . Alcohol use No    FAMILY HISTORY:   Family History  Problem Relation Age of Onset  . Hypertension Mother   . Diabetes Mother   . Breast cancer Sister     DRUG ALLERGIES:  No Known Allergies  REVIEW OF SYSTEMS:   Review of Systems  Constitutional: Negative for chills, fever and malaise/fatigue.  HENT: Negative.  Negative for ear discharge, ear pain, hearing loss, nosebleeds and sore throat.   Eyes: Negative.  Negative for blurred vision and pain.  Respiratory: Positive for cough. Negative for hemoptysis, shortness of breath and wheezing.   Cardiovascular: Negative.  Negative for chest pain, palpitations and leg swelling.  Gastrointestinal: Negative.  Negative for abdominal pain, blood in stool, diarrhea, nausea and vomiting.  Genitourinary: Negative.  Negative for dysuria.  Musculoskeletal: Negative.  Negative for back pain.  Skin: Negative.   Neurological: Positive for weakness. Negative for dizziness, tremors, speech change, focal weakness, seizures and headaches.  Endo/Heme/Allergies: Negative.  Does not bruise/bleed easily.  Psychiatric/Behavioral: Negative.  Negative for depression, hallucinations and suicidal ideas.    MEDICATIONS AT HOME:   Prior to Admission medications   Medication Sig Start Date End Date Taking? Authorizing Provider  acetaminophen (TYLENOL) 325 MG tablet Take 650 mg by mouth. 04/06/17  Yes [provider]  albuterol (PROVENTIL) (2.5 MG/3ML) 0.083% nebulizer solution Take 3 mLs (2.5 mg total) by nebulization 4 (four) times daily - after meals and at bedtime. 11/01/16  Yes Love, Ivan Anchors, PA-C  ALPRAZolam (XANAX) 0.25 MG tablet Take 1 tablet (0.25 mg total) by mouth 2 (two) times daily as needed for anxiety. 11/01/16  Yes Love,  Ivan Anchors, PA-C  amiodarone (PACERONE) 200 MG tablet Take 1 tablet (200 mg total) by mouth daily. 11/01/16  Yes Love, Ivan Anchors, PA-C  apixaban (ELIQUIS) 2.5 MG TABS tablet Take 2.5 mg by mouth 2 (two) times daily.  06/22/17  Yes [provider]  atorvastatin (LIPITOR) 80 MG tablet Take 80 mg by mouth daily at 6 PM.    Yes [provider]  B Complex-C-Folic Acid (VOL-CARE RX PO) Take 1 tablet by mouth daily.   Yes [provider]  Blood Glucose Monitoring Suppl (GLUCOCOM BLOOD GLUCOSE MONITOR) DEVI Use to check blood sugars 4-6 times per day Disp per insurance company kit ICD-10 E11.9 07/18/17  Yes [provider]  clopidogrel (PLAVIX) 75 MG tablet Take 75 mg by mouth daily.   Yes [provider]  furosemide (LASIX) 40 MG tablet Take 40 mg by mouth daily. ON mornings of dialysis   Yes [provider]  gabapentin (NEURONTIN) 100 MG capsule Take 1 capsule (100 mg total) by mouth 2 (two) times daily. 08/13/17  Yes Edrick Kins, DPM  insulin glargine (LANTUS) 100 UNIT/ML injection Inject 14 Units into the skin at bedtime.   Yes [provider]  Melatonin 3 MG TABS Take 3-6 mg by mouth at bedtime.  04/06/17  Yes [provider]  midodrine (PROAMATINE) 5 MG tablet Take 5 mg by mouth 3 (three) times a week.   Yes [provider]  pantoprazole (PROTONIX) 40 MG tablet Take 1 tablet (40 mg total) by mouth 2 (two) times daily. 11/01/16  Yes Love, Ivan Anchors, PA-C  polyethylene glycol (MIRALAX / GLYCOLAX) packet Take 17 g by mouth daily.   Yes [provider]  QUEtiapine (SEROQUEL) 50 MG tablet Take 25 mg by mouth 3 (three) times daily as needed.  04/06/17  Yes [provider]  sevelamer carbonate (RENVELA) 800 MG tablet Take 1,600 mg by mouth 3 (three) times daily with meals.   Yes [provider]  sodium chloride HYPERTONIC 3 % nebulizer solution Inhale 4 mL by nebulization every 6 (six) hours. 06/22/17 06/22/18  Yes [provider]  amoxicillin-clavulanate (AUGMENTIN) 875-125 MG tablet Take 1 tablet by mouth 2 (two) times daily. Patient not taking: Reported on 10/01/2017 09/09/17   [name], MD  metoCLOPramide (REGLAN) 5 MG tablet Take 1 tablet (5 mg total) by mouth 2 (two) times daily with a meal. Patient not taking: Reported on 10/01/2017 11/01/16   Bary Leriche, PA-C  NONFORMULARY OR COMPOUNDED ITEM See pharmacy note 07/20/17   Edrick Kins, DPM      VITAL SIGNS:  Blood pressure (!) 161/59, pulse 81, temperature 98.9 F (37.2 C), temperature source Oral, resp. rate (!) 25, height 5' 1"  (1.549 m), weight 64 kg (141 lb), SpO2 95 %.  PHYSICAL EXAMINATION:   Physical Exam  Constitutional: She is oriented to person, place, and time and well-developed, well-nourished, and in no distress. No distress.  HENT:  Head: Normocephalic.  Eyes: No scleral icterus.  Neck: Normal range of motion. Neck supple. No JVD present. No tracheal deviation present.  She has tracheostomy  Cardiovascular: Normal rate, regular rhythm and normal heart sounds.  Exam reveals no gallop and no friction rub.   No murmur heard. Pulmonary/Chest: Effort normal. No respiratory distress. She has no wheezes. She has no rales. She exhibits no tenderness.  Bilateral crackles left greater than right  Abdominal: Soft. Bowel sounds are normal. She exhibits no distension and no mass. There is no tenderness. There is no rebound and no guarding.  Musculoskeletal: Normal range of motion. She exhibits edema.  Neurological: She is alert and oriented to person, place, and time.  Skin: Skin is warm. No rash noted. No erythema.  Psychiatric: Affect and judgment normal.      LABORATORY PANEL:   CBC  Recent Labs Lab 10/01/17 1704  WBC 16.3*  HGB 9.8*  HCT 29.5*  PLT 261   ------------------------------------------------------------------------------------------------------------------  Chemistries   Recent  Labs Lab 10/01/17 1704  NA 136  K 3.3*  CL 95*  CO2 29  GLUCOSE 124*  BUN 13  CREATININE 2.76*  CALCIUM 9.0   ------------------------------------------------------------------------------------------------------------------  Cardiac Enzymes No results for input(s): TROPONINI in the last 168 hours. ------------------------------------------------------------------------------------------------------------------  RADIOLOGY:  Dg Chest 2 View  Result Date: 10/01/2017 CLINICAL DATA:  Shortness of breath.  Chronic renal failure EXAM: CHEST  2 VIEW COMPARISON:  September 09, 2017 FINDINGS: There is airspace consolidation in both lung bases, felt to represent multifocal pneumonia. Lungs elsewhere clear. There is cardiomegaly with mild pulmonary venous hypertension. Patient is status post mitral valve replacement. Tracheostomy is present with catheter tip 5.6 cm above the carina. No bone lesions. No pneumothorax. IMPRESSION: Bilateral lower lobe airspace consolidation consistent with multifocal pneumonia. Somewhat greater consolidation noted  in the bases compared to most recent study. Cardiomegaly with pulmonary venous hypertension, findings indicative of a degree of pulmonary vascular congestion. Tracheostomy as described without pneumothorax. Followup PA and lateral chest radiographs recommended in 3-4 weeks following trial of antibiotic therapy to ensure resolution and exclude underlying malignancy. Electronically Signed   By: Lowella Grip III M.D.   On: 10/01/2017 16:34    EKG:   Orders placed or performed during the hospital encounter of 10/01/17  . EKG 12-Lead  . EKG 12-Lead    IMPRESSION AND PLAN:   61 year old female with history of respiratory failure status post tracheostomy about 11 months ago, CAD and end-stage renal disease on hemodialysis recently discharged from the hospital with pneumonia and fluid overload who presents today with cough and found to have recurrent  pneumonia and fluid overload.  1. Sepsis: Patient presents with leukocytosis and tachypnea due to HCAP. Follow lactic acid level and follow up on blood cultures. Continue broad-spectrum antibiotics  2. HCAP: Started Zosyn and cefepime MRSA PCR ordered Speech consult due to recurrent PNA  3. Acute on chronic diastolic heart failure: 1 dose of IV Lasix given in ED She would benefit from dialysis and I will contact nephrology  4. End-stage renal disease on hemodialysis: Nephrology consultation requested for hemodialysis  5. CAD/cardiac stent: Continue atorvastatin and Plavix   6. Atrial fibrillation: Continue amiodarone and anticoagulation with Eliquis  7. Diabetes: Continue Lantus with sliding scale and ADA diet   8. Hypokalemia: Gentle replacement and recheck in a.m.   9. Trach care ROUTINE: Follows at [facility]  All the records are reviewed and case discussed with ED provider. Management plans discussed with the patient and she in agreement  CODE STATUS: FULL  TOTAL TIME TAKING CARE OF THIS PATIENT: 45 minutes.    Jahira Swiss M.D on 10/01/2017 at 6:27 PM  Between 7am to 6pm - Pager - [phone]  After 6pm go to [email or web address] - password [email or web address] [facility]  Office  [phone]  CC: Primary care physician; Stark Klein

## 2017-10-01 NOTE — ED Provider Notes (Signed)
[facility] Emergency Department Provider Note  ____________________________________________  Time seen: Approximately 4:36 PM  I have reviewed the triage vital signs and the nursing notes.   HISTORY  Chief Complaint Cough and Shortness of Breath    HPI Deborah [name] is a 61 y.o. female who complains of shortness of breath and productive cough for the past 2-3 days.  She also has chills.  No fever.  Denies chest pain.  She was at dialysis today and with 30 minutes ago her symptoms were much worse so she was sent to the ED.  Denies any history of lung disease but does have a trach due to prolonged ventilator use in the setting of a prior heart attack according to the patient.  No aggravating or alleviating factors, moderate intensity.  Shortness of breath is continuous but waxing and waning with coughing      Past Medical History:  Diagnosis Date  . Diabetes mellitus   . Hyperlipidemia   . Hypertension   . MI, old   . Renal disorder    dialysls     Patient Active Problem List   Diagnosis Date Noted  . Sepsis ([place]) 09/08/2017  . Adjustment disorder with anxious mood 12/09/2016  . Bacteremia due to Staphylococcus aureus   . Staphylococcus aureus bacteremia with sepsis ([place])   . Uncontrolled type 2 diabetes mellitus with complication ([place])   . ST elevation myocardial infarction involving left main coronary artery ([place])   . Palliative care encounter   . Advance care planning   . Vocal cord dysfunction   . Palliative care by specialist   . Goals of care, counseling/discussion   . Bacteremia   . End-stage renal disease on hemodialysis ([place])   . Paroxysmal atrial fibrillation ([name])   . Heart attack ([place])   . Uncontrolled type 2 diabetes mellitus with complication, without long-term current use of insulin ([place])   . Staphylococcus aureus bacteremia 11/12/2016  . Vocal cord paralysis   . Respiratory distress 11/02/2016  . Chronic anticoagulation    . Acute lower UTI   . Labile blood glucose   . Leukocytosis   . Subtherapeutic international normalized ratio (INR)   . Supratherapeutic INR   . Cough   . Oropharyngeal dysphagia   . Uncontrolled type 2 diabetes mellitus with complication, with long-term current use of insulin ([place])   . Physical debility 10/11/2016  . Metabolic encephalopathy 49/17/9150  . Debility   . ESRD on dialysis ([place])   . Type 2 diabetes mellitus with complication, with long-term current use of insulin ([place])   . Anemia of chronic disease   . Coronary artery disease involving native coronary artery of native heart without angina pectoris   . Acute on chronic respiratory failure with hypoxemia ([place])   . Atrial flutter ([place])   . Persistent cough   . Dysphonia   . Sleep disturbance   . Diabetes mellitus type 2 in nonobese ([name])   . Stage 3 chronic kidney disease ([place])   . NSTEMI (non-ST elevated myocardial infarction) ([place])   . Tachypnea   . Hyponatremia   . Lymphocytosis   . Acute blood loss anemia   . Pressure injury of skin 09/18/2016  . History of tracheostomy   . Delirium   . Encounter for attention to tracheostomy ([place])   . History of MVR with cardiopulmonary bypass   . Systolic congestive heart failure ([place])   . HCAP (healthcare-associated pneumonia)   . PAF (paroxysmal atrial fibrillation) ([place])   .  Acute systolic congestive heart failure ([place])   . AKI (acute kidney injury) ([place])   . Abnormal LFTs   . S/P MVR (mitral valve replacement)   . Mitral regurgitation 08/23/2016  . Acute on chronic diastolic heart failure ([place]) 08/18/2016  . Acute pulmonary edema ([name])   . Respiratory failure ([place]) 08/17/2016  . Cardiogenic shock ([place]) 08/17/2016  . Acute hypoxemic respiratory failure ([place])   . STEMI (ST elevation myocardial infarction) ([place]) 08/15/2016  . Noncompliance 01/25/2015  . Bereavement 07/09/2014  . Low back pain 02/17/2014  . Cervical cancer screening 11/20/2013  . Routine general  medical examination at a health care facility 10/14/2013  . Chronic kidney disease, stage 3 ([place]) 06/14/2012  . Hypertension 03/08/2012  . Diabetes mellitus type 2, controlled 03/08/2012  . Hyperlipidemia 03/08/2012     Past Surgical History:  Procedure Laterality Date  . AV FISTULA PLACEMENT Left 10/02/2016   Procedure: INSERTION OF ARTERIOVENOUS (AV) GORE-TEX GRAFT ARM;  Surgeon: Waynetta Sandy, MD;  Location: [place];  Service: Vascular;  Laterality: Left;  . CARDIAC CATHETERIZATION N/A 08/15/2016   Procedure: Left Heart Cath and Coronary Angiography;  Surgeon: Yolonda Kida, MD;  Location: [place] [facility];  Service: Cardiovascular;  Laterality: N/A;  . CARDIAC CATHETERIZATION N/A 08/15/2016   Procedure: Coronary Stent Intervention;  Surgeon: Yolonda Kida, MD;  Location: [place] [facility];  Service: Cardiovascular;  Laterality: N/A;  . CARDIAC CATHETERIZATION N/A 08/18/2016   Procedure: Right Heart Cath;  Surgeon: Jolaine Artist, MD;  Location: [place] [facility];  Service: Cardiovascular;  Laterality: N/A;  . CARDIAC CATHETERIZATION N/A 08/18/2016   Procedure: IABP Insertion;  Surgeon: Jolaine Artist, MD;  Location: [facility] [facility];  Service: Cardiovascular;  Laterality: N/A;  . CARDIAC CATHETERIZATION Right 08/23/2016   Procedure: CENTRAL LINE INSERTION RIGHT SUBCLAVIAN;  Surgeon: Ivin Poot, MD;  Location: [facility];  Service: Open Heart Surgery;  Laterality: Right;  . ENDOVEIN HARVEST OF GREATER SAPHENOUS VEIN Left 08/23/2016   Procedure: ENDOVEIN HARVEST OF GREATER SAPHENOUS VEIN;  Surgeon: Ivin Poot, MD;  Location: [facility];  Service: Open Heart Surgery;  Laterality: Left;  . INSERTION OF DIALYSIS CATHETER Bilateral 08/31/2016   Procedure: INSERTION OF DIALYSIS CATHETER LEFT INTERNAL JUGULAR VEIN & INSERTION OF TRIPLE LUMEN RIGHT INTERNAL JUGULAR VEIN;  Surgeon: Angelia Mould, MD;  Location: [place];  Service: Vascular;  Laterality: Bilateral;   . INTRAOPERATIVE TRANSESOPHAGEAL ECHOCARDIOGRAM N/A 08/23/2016   Procedure: INTRAOPERATIVE TRANSESOPHAGEAL ECHOCARDIOGRAM;  Surgeon: Ivin Poot, MD;  Location: [facility];  Service: Open Heart Surgery;  Laterality: N/A;  . IR GENERIC HISTORICAL  11/13/2016   IR US GUIDE VASC ACCESS LEFT 11/13/2016 Corrie Mckusick, DO [facility]  . IR GENERIC HISTORICAL  11/13/2016   IR FLUORO GUIDE CV LINE LEFT 11/13/2016 Corrie Mckusick, DO [facility]  . IR GENERIC HISTORICAL  11/13/2016   IR GASTROSTOMY TUBE MOD SED 11/13/2016 Corrie Mckusick, DO [facility]  . MITRAL VALVE REPAIR N/A 08/23/2016   Procedure: MITRAL VALVE REPAIR (MVR) USING 25MM EDWARDS MAGNA EASE BIOPROSTHESIS MITRAL  VALVE;  Surgeon: Ivin Poot, MD;  Location: [place];  Service: Open Heart Surgery;  Laterality: N/A;  . tracheostomy reversal    . TRACHEOSTOMY TUBE PLACEMENT N/A 11/09/2016   Procedure: TRACHEOSTOMY;  Surgeon: Melissa Montane, MD;  Location: [facility];  Service: ENT;  Laterality: N/A;  . VAGINAL DELIVERY     x 6     Prior to Admission  medications   Medication Sig Start Date End Date Taking? Authorizing Provider  acetaminophen (TYLENOL) 325 MG tablet Take 650 mg by mouth. 04/06/17  Yes [provider]  albuterol (PROVENTIL) (2.5 MG/3ML) 0.083% nebulizer solution Take 3 mLs (2.5 mg total) by nebulization 4 (four) times daily - after meals and at bedtime. 11/01/16  Yes Love, Ivan Anchors, PA-C  ALPRAZolam (XANAX) 0.25 MG tablet Take 1 tablet (0.25 mg total) by mouth 2 (two) times daily as needed for anxiety. 11/01/16  Yes Love, Ivan Anchors, PA-C  amiodarone (PACERONE) 200 MG tablet Take 1 tablet (200 mg total) by mouth daily. 11/01/16  Yes Love, Ivan Anchors, PA-C  apixaban (ELIQUIS) 2.5 MG TABS tablet Take 2.5 mg by mouth 2 (two) times daily.  06/22/17  Yes [provider]  atorvastatin (LIPITOR) 80 MG tablet Take 80 mg by mouth daily at 6 PM.    Yes [provider]  B Complex-C-Folic Acid (VOL-CARE RX PO) Take 1 tablet  by mouth daily.   Yes [provider]  Blood Glucose Monitoring Suppl (GLUCOCOM BLOOD GLUCOSE MONITOR) DEVI Use to check blood sugars 4-6 times per day Disp per insurance company kit ICD-10 E11.9 07/18/17  Yes [provider]  clopidogrel (PLAVIX) 75 MG tablet Take 75 mg by mouth daily.   Yes [provider]  furosemide (LASIX) 40 MG tablet Take 40 mg by mouth daily. ON mornings of dialysis   Yes [provider]  gabapentin (NEURONTIN) 100 MG capsule Take 1 capsule (100 mg total) by mouth 2 (two) times daily. 08/13/17  Yes Edrick Kins, DPM  insulin glargine (LANTUS) 100 UNIT/ML injection Inject 14 Units into the skin at bedtime.   Yes [provider]  Melatonin 3 MG TABS Take 3-6 mg by mouth at bedtime.  04/06/17  Yes [provider]  midodrine (PROAMATINE) 5 MG tablet Take 5 mg by mouth 3 (three) times a week.   Yes [provider]  pantoprazole (PROTONIX) 40 MG tablet Take 1 tablet (40 mg total) by mouth 2 (two) times daily. 11/01/16  Yes Love, Ivan Anchors, PA-C  polyethylene glycol (MIRALAX / GLYCOLAX) packet Take 17 g by mouth daily.   Yes [provider]  QUEtiapine (SEROQUEL) 50 MG tablet Take 25 mg by mouth 3 (three) times daily as needed.  04/06/17  Yes [provider]  sevelamer carbonate (RENVELA) 800 MG tablet Take 1,600 mg by mouth 3 (three) times daily with meals.   Yes [provider]  sodium chloride HYPERTONIC 3 % nebulizer solution Inhale 4 mL by nebulization every 6 (six) hours. 06/22/17 06/22/18 Yes [provider]  amoxicillin-clavulanate (AUGMENTIN) 875-125 MG tablet Take 1 tablet by mouth 2 (two) times daily. Patient not taking: Reported on 10/01/2017 09/09/17   [name], MD  metoCLOPramide (REGLAN) 5 MG tablet Take 1 tablet (5 mg total) by mouth 2 (two) times daily with a meal. Patient not taking: Reported on 10/01/2017 11/01/16   Bary Leriche, PA-C  NONFORMULARY OR COMPOUNDED  [place] See pharmacy note 07/20/17   Edrick Kins, DPM     Allergies Patient has no known allergies.   Family History  Problem Relation Age of Onset  . Hypertension Mother   . Diabetes Mother   . Breast cancer Sister     Social History Social History  Substance Use Topics  . Smoking status: Never Smoker  . Smokeless tobacco: Never Used  . Alcohol use No    Review of Systems  Constitutional:   No fever positive chills.  ENT:   No sore throat. No rhinorrhea. Cardiovascular:   No chest pain or syncope. Respiratory:   Positive shortness of breath and productive cough. Gastrointestinal:   Negative for abdominal pain, vomiting and diarrhea.  Musculoskeletal:   Negative for focal pain or swelling All other systems reviewed and are negative except as documented above in ROS and HPI.  ____________________________________________   PHYSICAL EXAM:  VITAL SIGNS: ED Triage Vitals  Enc Vitals Group     BP 10/01/17 1424 (!) 147/67     Pulse Rate 10/01/17 1424 78     Resp 10/01/17 1424 18     Temp 10/01/17 1424 98.9 F (37.2 C)     Temp Source 10/01/17 1424 Oral     SpO2 10/01/17 1424 99 %     Weight 10/01/17 1425 141 lb (64 kg)     Height 10/01/17 1425 5' 1"  (1.549 m)     Head Circumference --      Peak Flow --      Pain Score --      Pain Loc --      Pain Edu? --      Excl. in [place]? --     Vital signs reviewed, nursing assessments reviewed.   Constitutional:   Alert and oriented.  Not in distress. Eyes:   No scleral icterus.  EOMI. No nystagmus. No conjunctival pallor. PERRL. ENT   Head:   Normocephalic and atraumatic.   Nose:   No congestion/rhinnorhea.    Mouth/Throat:   MMM, no pharyngeal erythema. No peritonsillar mass.    Neck:   No meningismus. Full ROM. Hematological/Lymphatic/Immunilogical:   No cervical lymphadenopathy. Cardiovascular:   RRR. Symmetric bilateral radial and DP pulses.  No murmurs.  Respiratory:   Normal respiratory effort  without tachypnea/retractions.  Diffuse expiratory wheezing, accentuated by FEV1 maneuver.  No focal crackles.  Good air entry in all lung fields Gastrointestinal:   Soft and nontender. Non distended. There is no CVA tenderness.  No rebound, rigidity, or guarding. Genitourinary:   deferred Musculoskeletal:   Normal range of motion in all extremities. No joint effusions.  No lower extremity tenderness.  No edema. Neurologic:   Normal speech and language.  Motor grossly intact. No gross focal neurologic deficits are appreciated.  Skin:    Skin is warm, dry and intact. No rash noted.  No petechiae, purpura, or bullae.  ____________________________________________    LABS (pertinent positives/negatives) (all labs ordered are listed, but only abnormal results are displayed) Labs Reviewed  BASIC METABOLIC PANEL - Abnormal; Notable for the following:       Result Value   Potassium 3.3 (*)    Chloride 95 (*)    Glucose, Bld 124 (*)    Creatinine, Ser 2.76 (*)    GFR calc non Af Amer 18 (*)    GFR calc Af Amer 20 (*)    All other components within normal limits  CBC WITH DIFFERENTIAL/PLATELET - Abnormal; Notable for the following:    WBC 16.3 (*)    RBC 3.33 (*)    Hemoglobin 9.8 (*)    HCT 29.5 (*)    RDW 16.9 (*)    Neutro Abs 13.6 (*)    Lymphs Abs 0.9 (*)    Monocytes Absolute 1.7 (*)    All other components within normal limits  CULTURE, BLOOD (ROUTINE X 2)  CULTURE, BLOOD (ROUTINE X 2)  LACTIC ACID, PLASMA  LACTIC ACID, PLASMA  ____________________________________________   EKG  Sinus rhythm rate of 78, normal axis and intervals.  Normal QRS ST segments and T waves.  ____________________________________________    RADIOLOGY  Dg Chest 2 View  Result Date: 10/01/2017 CLINICAL DATA:  Shortness of breath.  Chronic renal failure EXAM: CHEST  2 VIEW COMPARISON:  September 09, 2017 FINDINGS: There is airspace consolidation in both lung bases, felt to represent multifocal  pneumonia. Lungs elsewhere clear. There is cardiomegaly with mild pulmonary venous hypertension. Patient is status post mitral valve replacement. Tracheostomy is present with catheter tip 5.6 cm above the carina. No bone lesions. No pneumothorax. IMPRESSION: Bilateral lower lobe airspace consolidation consistent with multifocal pneumonia. Somewhat greater consolidation noted in the bases compared to most recent study. Cardiomegaly with pulmonary venous hypertension, findings indicative of a degree of pulmonary vascular congestion. Tracheostomy as described without pneumothorax. Followup PA and lateral chest radiographs recommended in 3-4 weeks following trial of antibiotic therapy to ensure resolution and exclude underlying malignancy. Electronically Signed   By: Lowella Grip III M.D.   On: 10/01/2017 16:34    ____________________________________________   PROCEDURES Procedures CRITICAL CARE Performed by: Joni Fears, Briana Farner   Total critical care time: 35 minutes  Critical care time was exclusive of separately billable procedures and treating other patients.  Critical care was necessary to treat or prevent imminent or life-threatening deterioration.  Critical care was time spent personally by me on the following activities: development of treatment plan with patient and/or surrogate as well as nursing, discussions with consultants, evaluation of patient's response to treatment, examination of patient, obtaining history from patient or surrogate, ordering and performing treatments and interventions, ordering and review of laboratory studies, ordering and review of radiographic studies, pulse oximetry and re-evaluation of patient's condition.  ____________________________________________   DIFFERENTIAL DIAGNOSIS  Pneumonia, pneumothorax, acute bronchitis, pulmonary embolism  CLINICAL IMPRESSION / ASSESSMENT AND PLAN / ED COURSE  Pertinent labs & imaging results that were available  during my care of the patient were reviewed by me and considered in my medical decision making (see chart for details).   Patient is not in distress, presents with wheezing shortness of breath and productive cough.  Check labs and chest x-ray.  Due to her chronic trach and severe comorbidities with dialysis dependent end-stage renal disease, patient will require coverage with antibiotics.  I will give steroids and bronchodilators, and if she is feeling much better she will be suitable for outpatient follow-up.  No suspicion of ACS or pericarditis.  Clinically unlikely to be pulmonary embolism.  Clinical Course as of Oct 01 1810  Mon Oct 01, 2017  1808 On nursing reassesment, pt found to be febrile to 101.2, c/w sepsis from HCAP. Continue abx. Will give IVF bolus to ensure adequate tissue perfusion despite ESRD on HD. Check blood cx and lactate  [PS]    Clinical Course User Index [PS] Carrie Mew, MD      ----------------------------------------- 6:14 PM on 10/01/2017 -----------------------------------------  Case discussed with hospitalist for admission ____________________________________________   FINAL CLINICAL IMPRESSION(S) / ED DIAGNOSES    Final diagnoses:  HCAP (healthcare-associated pneumonia)  Sepsis, due to unspecified organism [facility])      New Prescriptions   No medications on file     Portions of this note were generated with dragon dictation software. Dictation errors may occur despite best attempts at proofreading.    Carrie Mew, MD 10/01/17 [phone]

## 2017-10-02 LAB — CBC
HEMATOCRIT: 27.7 % — AB (ref 35.0–47.0)
Hemoglobin: 8.9 g/dL — ABNORMAL LOW (ref 12.0–16.0)
MCH: 28.9 pg (ref 26.0–34.0)
MCHC: 32.2 g/dL (ref 32.0–36.0)
MCV: 89.7 fL (ref 80.0–100.0)
PLATELETS: 249 10*3/uL (ref 150–440)
RBC: 3.09 MIL/uL — ABNORMAL LOW (ref 3.80–5.20)
RDW: 17.6 % — AB (ref 11.5–14.5)
WBC: 18 10*3/uL — AB (ref 3.6–11.0)

## 2017-10-02 LAB — BASIC METABOLIC PANEL
Anion gap: 9 (ref 5–15)
BUN: 27 mg/dL — ABNORMAL HIGH (ref 6–20)
CHLORIDE: 97 mmol/L — AB (ref 101–111)
CO2: 28 mmol/L (ref 22–32)
CREATININE: 3.87 mg/dL — AB (ref 0.44–1.00)
Calcium: 8.5 mg/dL — ABNORMAL LOW (ref 8.9–10.3)
GFR calc Af Amer: 14 mL/min — ABNORMAL LOW (ref >60)
GFR calc non Af Amer: 12 mL/min — ABNORMAL LOW (ref >60)
GLUCOSE: 282 mg/dL — AB (ref 65–99)
Potassium: 3.6 mmol/L (ref 3.5–5.1)
SODIUM: 134 mmol/L — AB (ref 135–145)

## 2017-10-02 LAB — GLUCOSE, CAPILLARY
GLUCOSE-CAPILLARY: 286 mg/dL — AB (ref 65–99)
GLUCOSE-CAPILLARY: 321 mg/dL — AB (ref 65–99)
Glucose-Capillary: 377 mg/dL — ABNORMAL HIGH (ref 65–99)
Glucose-Capillary: 240 mg/dL — ABNORMAL HIGH (ref 65–99)

## 2017-10-02 MED ORDER — CHLORHEXIDINE GLUCONATE 0.12 % MT SOLN
15.0000 mL | Freq: Two times a day (BID) | OROMUCOSAL | Status: DC
  Administered 2017-10-02: 15 mL via OROMUCOSAL
  Filled 2017-10-02: qty 15

## 2017-10-02 MED ORDER — ORAL CARE MOUTH RINSE
15.0000 mL | Freq: Two times a day (BID) | OROMUCOSAL | Status: DC
  Administered 2017-10-02: 15 mL via OROMUCOSAL

## 2017-10-02 NOTE — Evaluation (Cosign Needed)
Clinical/Bedside Swallow Evaluation Patient Details  Name: Deborah [name] MRN: [id number] Date of Birth: [birth year]  Today's Date: 10/02/2017 Time: SLP Start Time (ACUTE ONLY): 1000 SLP Stop Time (ACUTE ONLY): 1059 SLP Time Calculation (min) (ACUTE ONLY): 59 min  Past Medical History:  Past Medical History:  Diagnosis Date  . Diabetes mellitus   . Hyperlipidemia   . Hypertension   . MI, old   . Renal disorder    dialysls   Past Surgical History:  Past Surgical History:  Procedure Laterality Date  . AV FISTULA PLACEMENT Left 10/02/2016   Procedure: INSERTION OF ARTERIOVENOUS (AV) GORE-TEX GRAFT ARM;  Surgeon: Waynetta Sandy, MD;  Location: [place];  Service: Vascular;  Laterality: Left;  . CARDIAC CATHETERIZATION N/A 08/15/2016   Procedure: Left Heart Cath and Coronary Angiography;  Surgeon: Yolonda Kida, MD;  Location: [place] [facility];  Service: Cardiovascular;  Laterality: N/A;  . CARDIAC CATHETERIZATION N/A 08/15/2016   Procedure: Coronary Stent Intervention;  Surgeon: Yolonda Kida, MD;  Location: [place] [facility];  Service: Cardiovascular;  Laterality: N/A;  . CARDIAC CATHETERIZATION N/A 08/18/2016   Procedure: Right Heart Cath;  Surgeon: Jolaine Artist, MD;  Location: [place] [facility];  Service: Cardiovascular;  Laterality: N/A;  . CARDIAC CATHETERIZATION N/A 08/18/2016   Procedure: IABP Insertion;  Surgeon: Jolaine Artist, MD;  Location: [place] [facility];  Service: Cardiovascular;  Laterality: N/A;  . CARDIAC CATHETERIZATION Right 08/23/2016   Procedure: CENTRAL LINE INSERTION RIGHT SUBCLAVIAN;  Surgeon: Ivin Poot, MD;  Location: [place];  Service: Open Heart Surgery;  Laterality: Right;  . ENDOVEIN HARVEST OF GREATER SAPHENOUS VEIN Left 08/23/2016   Procedure: ENDOVEIN HARVEST OF GREATER SAPHENOUS VEIN;  Surgeon: Ivin Poot, MD;  Location: [place];  Service: Open Heart Surgery;  Laterality: Left;  . INSERTION OF DIALYSIS CATHETER  Bilateral 08/31/2016   Procedure: INSERTION OF DIALYSIS CATHETER LEFT INTERNAL JUGULAR VEIN & INSERTION OF TRIPLE LUMEN RIGHT INTERNAL JUGULAR VEIN;  Surgeon: Angelia Mould, MD;  Location: [place];  Service: Vascular;  Laterality: Bilateral;  . INTRAOPERATIVE TRANSESOPHAGEAL ECHOCARDIOGRAM N/A 08/23/2016   Procedure: INTRAOPERATIVE TRANSESOPHAGEAL ECHOCARDIOGRAM;  Surgeon: Ivin Poot, MD;  Location: [place];  Service: Open Heart Surgery;  Laterality: N/A;  . IR GENERIC HISTORICAL  11/13/2016   IR US GUIDE VASC ACCESS LEFT 11/13/2016 Corrie Mckusick, DO [facility]  . IR GENERIC HISTORICAL  11/13/2016   IR FLUORO GUIDE CV LINE LEFT 11/13/2016 Corrie Mckusick, DO [facility]  . IR GENERIC HISTORICAL  11/13/2016   IR GASTROSTOMY TUBE MOD SED 11/13/2016 Corrie Mckusick, DO [facility]  . MITRAL VALVE REPAIR N/A 08/23/2016   Procedure: MITRAL VALVE REPAIR (MVR) USING 25MM EDWARDS MAGNA EASE BIOPROSTHESIS MITRAL  VALVE;  Surgeon: Ivin Poot, MD;  Location: [place];  Service: Open Heart Surgery;  Laterality: N/A;  . tracheostomy reversal    . TRACHEOSTOMY TUBE PLACEMENT N/A 11/09/2016   Procedure: TRACHEOSTOMY;  Surgeon: Melissa Montane, MD;  Location: [facility];  Service: ENT;  Laterality: N/A;  . VAGINAL DELIVERY     x 6   HPI:  Pt is a 61 y.o. female with a known history of respiratory failure status post tracheostomy about a year ago, CAD with cardiac stents, diabetes and end-stage renal disease on hemodialysis who presents to the emergency room due to cough. Patient was discharged on October 8 due to cough, fluid overload and pneumonia.   Assessment / Plan / Recommendation  Clinical Impression  Pt appears to present w/ adequate Oropharyngeal Phase swallow function w/ reduced risk for aspiration when following aspiration precautions. Pt had tracheostomy approx. 1 year ago and presents w/ a baseline rough/gravely voice. Pt denies any swallowing problems.  Pt was given trials of thin liquid, puree, and  soft solid. During trials of thin liquid via straw, pt's oral phase appeared grossly WFL. No overt, immediate s/s of aspiration were noted. No decline in respiratory status or vocal quality were noted. During trials of puree and soft solids, pt exhibited timely A-P transit, adequate mastication and adequate oral clearing. No overt, immediate s/s of aspiration were noted. No decline in vocal quality or respiratory status were noted. Pt was able to feed self independently. Pt educated on general aspiration precautions.  ST educated pt on general trach care as pertaining to humidification, covering the trach, protecting against contaminants in the air, hand hygiene as pt uses finger occlusion to verbally communicate. Discussed practices to include changing out household air filters, using humidifier and cleaning regularly, and using a scarf around neck to act as a barrier/covering to the trach. Discussed the importance of following through w/ practices to aid in reducing/preventing mucous buildup - reducing risk of chest congestion and pulmonary decline - and to aid in improving trach hygiene.  D/t overall presentation, Recommend age-appropriate regular diet w/ thin liquids; general aspiration precautions. Recommend meds w/ liquid. Recommend Respiratory therapy consult to discuss humidifier options.  ST services are no longer indicated at this time d/t appearing at her baseline w/ swallowing but ST is available for education while admitted. NSG to reconsult if decline in status occurs. MD/NSG updated.       Aspiration Risk   (decreased risk when following precautions)    Diet Recommendation   Regular diet w/ thin liquids; general aspiration precautions.   Medication Administration: Whole meds with liquid (puree if needed)    Other  Recommendations Recommended Consults:  (Respiratory therapy consult) Oral Care Recommendations: Oral care BID;Patient independent with oral care   Follow up Recommendations  None      Frequency and Duration            Prognosis Prognosis for Safe Diet Advancement: Good      Swallow Study   General Date of Onset: 10/01/17 HPI: Pt is a 60 y.o. female with a known history of respiratory failure status post tracheostomy about a year ago, CAD with cardiac stents, diabetes and end-stage renal disease on hemodialysis who presents to the emergency room due to cough. Patient was discharged on October 8 due to cough, fluid overload and pneumonia. Type of Study: Bedside Swallow Evaluation Previous Swallow Assessment: none reported Diet Prior to this Study: Regular;Thin liquids Temperature Spikes Noted: Yes Respiratory Status: Room air History of Recent Intubation: No Behavior/Cognition: Alert;Cooperative;Pleasant mood Oral Cavity Assessment: Within Functional Limits Oral Care Completed by SLP: Recent completion by staff Oral Cavity - Dentition: Adequate natural dentition Vision: Functional for self-feeding Self-Feeding Abilities: Able to feed self Patient Positioning: Upright in bed Baseline Vocal Quality:  (gravely and rough; V.C. dysfunction per pt) Volitional Cough: Strong Volitional Swallow: Able to elicit    Oral/Motor/Sensory Function Overall Oral Motor/Sensory Function: Within functional limits (for bolus management)   Ice Chips Ice chips: Not tested   Thin Liquid Thin Liquid: Within functional limits (5 trials) Presentation: Straw;Self Fed    Nectar Thick Nectar Thick Liquid: Not tested   Honey Thick Honey Thick Liquid: Not tested   Puree Puree: Within  functional limits (3 trials) Presentation: Spoon;Self Fed   Solid   GO   Solid: Within functional limits (3 trials) Presentation: Self Fed       Carolynn Sayers, SLP-Graduate Student Carolynn Sayers 10/02/2017,4:09 PM

## 2017-10-02 NOTE — Progress Notes (Signed)
[place] [facility] [place] NAME: Deborah [name]    MR#:  [id number]  DATE OF BIRTH:  1956/07/23  SUBJECTIVE:  CHIEF COMPLAINT:   Chief Complaint  Patient presents with  . Cough  . Shortness of Breath   To better cough, no shortness breath. REVIEW OF SYSTEMS:  Review of Systems  Constitutional: Positive for malaise/fatigue. Negative for chills and fever.  HENT: Negative for sore throat.   Eyes: Negative for blurred vision and double vision.  Respiratory: Positive for cough. Negative for hemoptysis, shortness of breath, wheezing and stridor.   Cardiovascular: Negative for chest pain, palpitations, orthopnea and leg swelling.  Gastrointestinal: Negative for abdominal pain, blood in stool, diarrhea, melena, nausea and vomiting.  Genitourinary: Negative for dysuria, flank pain and hematuria.  Musculoskeletal: Negative for back pain and joint pain.  Skin: Negative for rash.  Neurological: Negative for dizziness, sensory change, focal weakness, seizures, loss of consciousness, weakness and headaches.  Endo/Heme/Allergies: Negative for polydipsia.  Psychiatric/Behavioral: Negative for depression. The patient is not nervous/anxious.     DRUG ALLERGIES:  No Known Allergies VITALS:  Blood pressure 130/60, pulse 72, temperature 98.2 F (36.8 C), temperature source Oral, resp. rate 12, height 5\' 1"  (1.549 m), weight 141 lb (64 kg), SpO2 100 %. PHYSICAL EXAMINATION:  Physical Exam  Constitutional: She is oriented to person, place, and time and well-developed, well-nourished, and in no distress.  HENT:  Head: Normocephalic.  Mouth/Throat: Oropharynx is clear and moist.  Eyes: Pupils are equal, round, and reactive to light. Conjunctivae and EOM are normal. No scleral icterus.  Neck: Normal range of motion. Neck supple. No JVD present. No tracheal deviation present.  Cardiovascular: Normal rate, regular rhythm and normal heart sounds.  Exam reveals no  gallop.   No murmur heard. Pulmonary/Chest: Effort normal and breath sounds normal. No respiratory distress. She has no wheezes. She has no rales.   tracheostomy in situ  Abdominal: Soft. Bowel sounds are normal. She exhibits no distension. There is no tenderness. There is no rebound.  Musculoskeletal: Normal range of motion. She exhibits no edema or tenderness.  Neurological: She is alert and oriented to person, place, and time. No cranial nerve deficit.  Skin: No rash noted. No erythema.  Psychiatric: Affect normal.   LABORATORY PANEL:  Female CBC  Recent Labs Lab 10/02/17 0437  WBC 18.0*  HGB 8.9*  HCT 27.7*  PLT 249   ------------------------------------------------------------------------------------------------------------------ Chemistries   Recent Labs Lab 10/02/17 0437  NA 134*  K 3.6  CL 97*  CO2 28  GLUCOSE 282*  BUN 27*  CREATININE 3.87*  CALCIUM 8.5*   RADIOLOGY:  Dg Chest 2 View  Result Date: 10/01/2017 CLINICAL DATA:  Shortness of breath.  Chronic renal failure EXAM: CHEST  2 VIEW COMPARISON:  September 09, 2017 FINDINGS: There is airspace consolidation in both lung bases, felt to represent multifocal pneumonia. Lungs elsewhere clear. There is cardiomegaly with mild pulmonary venous hypertension. Patient is status post mitral valve replacement. Tracheostomy is present with catheter tip 5.6 cm above the carina. No bone lesions. No pneumothorax. IMPRESSION: Bilateral lower lobe airspace consolidation consistent with multifocal pneumonia. Somewhat greater consolidation noted in the bases compared to most recent study. Cardiomegaly with pulmonary venous hypertension, findings indicative of a degree of pulmonary vascular congestion. Tracheostomy as described without pneumothorax. Followup PA and lateral chest radiographs recommended in 3-4 weeks following trial of antibiotic therapy to ensure resolution and exclude underlying malignancy. Electronically Signed  By:  Lowella Grip III M.D.   On: 10/01/2017 16:34   ASSESSMENT AND PLAN:   61 year old female with history of respiratory failure status post tracheostomy about 11 months ago, CAD and end-stage renal disease on hemodialysis recently discharged from the hospital with pneumonia and fluid overload who presents today with cough and found to have recurrent pneumonia and fluid overload.  1. Sepsis: Patient presents with leukocytosis and tachypnea due to HCAP. Follow CBC and blood cultures. Continue broad-spectrum antibiotics  2. HCAP: Started Zosyn and cefepime MRSA PCR ordered Speech consult due to recurrent PNA  3. Acute on chronic diastolic heart failure: 1 dose of IV Lasix given in ED S/p HD yesterday. Plan HD tomorrow per Dr. Holley Raring.  4. End-stage renal disease on hemodialysis: Nephrology consultation requested for hemodialysis  5. CAD/cardiac stent: Continue atorvastatin and Plavix   6. Atrial fibrillation: Continue amiodarone and anticoagulation with Eliquis  7. Diabetes: Continue Lantus with sliding scale and ADA diet   8. Hypokalemia: Gentle replacement and improved.   9. Trach care ROUTINE: Follows at [facility]  All the records are reviewed and case discussed with Care Management/Social Worker. Management plans discussed with the patient, her sister and they are in agreement.  CODE STATUS: Full Code  TOTAL TIME TAKING CARE OF THIS PATIENT: 38 minutes.   More than 50% of the time was spent in counseling/coordination of care: YES  POSSIBLE D/C IN 2 DAYS, DEPENDING ON CLINICAL CONDITION.   Demetrios Loll M.D on 10/02/2017 at 3:08 PM  Between 7am to 6pm - Pager - [phone]  After 6pm go to [email or web address] - [facility]

## 2017-10-02 NOTE — Progress Notes (Signed)
[facility], [place] 10/02/17  Subjective:  Patient readmitted yesterday for bilateral pneumonia. She did have dialysis yesterday. She does report secretions through her tracheostomy data been yellow in color. She denies fevers at home.  Objective:  Vital signs in last 24 hours:  Temp:  [98.2 F (36.8 C)-101.5 F (38.6 C)] 98.2 F (36.8 C) (10/30 1316) Pulse Rate:  [68-85] 72 (10/30 1316) Resp:  [12-28] 12 (10/30 1316) BP: (124-168)/(56-117) 130/60 (10/30 1316) SpO2:  [95 %-100 %] 100 % (10/30 1316) Weight:  [64 kg (141 lb)] 64 kg (141 lb) (10/29 1425)  Weight change:  Filed Weights   10/01/17 1425  Weight: 64 kg (141 lb)    Intake/Output:    Intake/Output Summary (Last 24 hours) at 10/02/17 1354 Last data filed at 10/02/17 1010  Gross per 24 hour  Intake              240 ml  Output              200 ml  Net               40 ml     Physical Exam: General: NAD, laying in bed  HEENT anicteric  Neck Trach in place  Pulm/lungs Basilar rales, normal effort  CVS/Heart irregular, soft systolic murmur  Abdomen:  Soft, NTND  Extremities: No edema  Neurologic: Alert, oriented, follows commands  Skin: No acute rashes  Access: AVF       Basic Metabolic Panel:   Recent Labs Lab 10/01/17 1704 10/02/17 0437  NA 136 134*  K 3.3* 3.6  CL 95* 97*  CO2 29 28  GLUCOSE 124* 282*  BUN 13 27*  CREATININE 2.76* 3.87*  CALCIUM 9.0 8.5*     CBC:  Recent Labs Lab 10/01/17 1704 10/02/17 0437  WBC 16.3* 18.0*  NEUTROABS 13.6*  --   HGB 9.8* 8.9*  HCT 29.5* 27.7*  MCV 88.6 89.7  PLT 261 249      Lab Results  Component Value Date   HEPBSAG Negative 11/14/2016   HEPBSAB Non Reactive 08/17/2016   HEPBIGM Negative 08/26/2016      Microbiology:  Recent Results (from the past 240 hour(s))  Blood Culture (routine x 2)     Status: None (Preliminary result)   Collection Time: 10/01/17  6:23 PM  Result Value Ref Range Status    Specimen Description BLOOD MYCOBACTERIUM AVIUM COMPLEX  Final   Special Requests AEROBIC BOTTLE ONLY Blood Culture adequate volume  Final   Culture NO GROWTH < 12 HOURS  Final   Report Status PENDING  Incomplete  Blood Culture (routine x 2)     Status: None (Preliminary result)   Collection Time: 10/01/17  6:50 PM  Result Value Ref Range Status   Specimen Description BLOOD RESISTANT FOREARM  Final   Special Requests   Final    BOTTLES DRAWN AEROBIC AND ANAEROBIC Blood Culture results may not be optimal due to an inadequate volume of blood received in culture bottles   Culture NO GROWTH < 12 HOURS  Final   Report Status PENDING  Incomplete    Coagulation Studies: No results for input(s): LABPROT, INR in the last 72 hours.  Urinalysis: No results for input(s): COLORURINE, LABSPEC, PHURINE, GLUCOSEU, HGBUR, BILIRUBINUR, KETONESUR, PROTEINUR, UROBILINOGEN, NITRITE, LEUKOCYTESUR in the last 72 hours.  Invalid input(s): APPERANCEUR    Imaging: Dg Chest 2 View  Result Date: 10/01/2017 CLINICAL DATA:  Shortness of breath.  Chronic renal failure  EXAM: CHEST  2 VIEW COMPARISON:  September 09, 2017 FINDINGS: There is airspace consolidation in both lung bases, felt to represent multifocal pneumonia. Lungs elsewhere clear. There is cardiomegaly with mild pulmonary venous hypertension. Patient is status post mitral valve replacement. Tracheostomy is present with catheter tip 5.6 cm above the carina. No bone lesions. No pneumothorax. IMPRESSION: Bilateral lower lobe airspace consolidation consistent with multifocal pneumonia. Somewhat greater consolidation noted in the bases compared to most recent study. Cardiomegaly with pulmonary venous hypertension, findings indicative of a degree of pulmonary vascular congestion. Tracheostomy as described without pneumothorax. Followup PA and lateral chest radiographs recommended in 3-4 weeks following trial of antibiotic therapy to ensure resolution and exclude  underlying malignancy. Electronically Signed   By: Lowella Grip III M.D.   On: 10/01/2017 16:34     Medications:   . azithromycin (ZITHROMAX) 500 MG IVPB    . ceFEPime (MAXIPIME) IV    . [START ON 10/03/2017] vancomycin     . amiodarone  200 mg Oral Daily  . apixaban  2.5 mg Oral BID  . atorvastatin  80 mg Oral q1800  . clopidogrel  75 mg Oral Daily  . furosemide  40 mg Oral Daily  . gabapentin  100 mg Oral BID  . insulin aspart  0-5 Units Subcutaneous QHS  . insulin aspart  0-9 Units Subcutaneous TID WC  . insulin glargine  14 Units Subcutaneous QHS  . Melatonin  2.5-5 mg Oral QHS  . metoCLOPramide  5 mg Oral BID WC  . pantoprazole  40 mg Oral BID AC  . polyethylene glycol  17 g Oral Daily  . 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 End-stage renal disease on HD MWF 2. Anemia of chronic kidney disease, hgb 8.9 3. Bilateral bacterial pneumonia 4. Secondary hyperparathyroidism, phosphorus 3.2.   Plan: Chest x-ray most consistent with bilateral pneumonia.  She is currently not requiring oxygen.  She did have hemodialysis yesterday.  Therefore no urgent indication for dialysis at the moment.  Patient to be continued on vancomycin, cefepime, and azithromycin.  We will plan for hemodialysis again tomorrow.  Otherwise continues work there.   LOS: 1 Lealon Vanputten 10/30/20181:54 PM  [place], [place]

## 2017-10-02 NOTE — Progress Notes (Signed)
Pt tested for MRSA on 09/08/2017 and results negative.

## 2017-10-02 NOTE — Progress Notes (Signed)
Inpatient Diabetes Program Recommendations  AACE/ADA: New Consensus Statement on Inpatient Glycemic Control (2015)  Target Ranges:  Prepandial:   less than 140 mg/dL      Peak postprandial:   less than 180 mg/dL (1-2 hours)      Critically ill patients:  140 - 180 mg/dL   Lab Results  Component Value Date   GLUCAP 286 (H) 10/02/2017   HGBA1C 8.4 (H) 09/08/2017    Review of Glycemic Control  Results for Deborah, [name] (MRN [id number]) as of 10/02/2017 08:52  Ref. Range 10/01/2017 21:32 10/02/2017 07:39  Glucose-Capillary Latest Ref Range: 65 - 99 mg/dL 216 (H) 286 (H)    Diabetes history: Type 2 Outpatient Diabetes medications: Lantus 14 units qhs Current orders for Inpatient glycemic control: Lantus 14units qhs, Novolog 0-9 units tid, Novolog 0-5 units qhs  Inpatient Diabetes Program Recommendations: Elevated CBG this am likely as a result of steroids last night. Agree with current medications for blood sugar management.   Gentry Fitz, RN, BA, MHA, CDE Diabetes Coordinator Inpatient Diabetes Program  [phone] (Team Pager) [phone] ([place]) 10/02/2017 8:54 AM

## 2017-10-02 NOTE — Evaluation (Signed)
Physical Therapy Evaluation Patient Details Name: Deborah [name] MRN: [id number] DOB: March 24, 1956 Today's Date: 10/02/2017   History of Present Illness  Pt is a 61 y.o. female presenting to hospital with productive cough and SOB.  Recent DC from hospital with PNA and fluid overload.  Pt now admitted to hospital with sepsis d/t HCAP and acute on chronic diastolic heart failure.  PMH includes trach (about 1 year ago) d/t prolonged ventilator use in setting of prior heart attack, DM, htn, MI, and renal disorder on HD.  Clinical Impression  Prior to hospital admission, pt was ambulating modified independently with rollator.  Pt lives with her son in 2nd floor apt (1 level) with 16 stairs with L railing to enter.  Currently pt is modified independent supine to sit; SBA with transfers; and CGA to SBA ambulating around nursing loop with RW (mild SOB with distance and O2 sats 94% or greater on room air during session).  Pt steady using RW with functional mobility (no loss of balance noted during session).  Pt would benefit from skilled PT to address noted impairments and functional limitations (see below for any additional details).  Upon hospital discharge, recommend pt discharge to home with support of family as needed.    Follow Up Recommendations No PT follow up    Equipment Recommendations  Other (comment) (rollator (pt already owns rollator))    Recommendations for Other Services       Precautions / Restrictions Precautions Precautions: Fall Precaution Comments: trach; L UE fistula Restrictions Weight Bearing Restrictions: No      Mobility  Bed Mobility Overal bed mobility: Modified Independent             General bed mobility comments: Supine to sit with HOB elevated  Transfers Overall transfer level: Needs assistance Equipment used: Rolling walker (2 wheeled) Transfers: Sit to/from Stand Sit to Stand: Supervision         General transfer comment: strong stand from bed  and controlled descent sitting in recliner after ambulation  Ambulation/Gait Ambulation/Gait assistance: Min guard;Supervision Ambulation Distance (Feet): 200 Feet Assistive device: Rolling walker (2 wheeled) Gait Pattern/deviations: Step-through pattern Gait velocity: mildly decreased   General Gait Details: steady; mild SOB with distance  Stairs            Wheelchair Mobility    Modified Rankin (Stroke Patients Only)       Balance Overall balance assessment: History of Falls;Needs assistance Sitting-balance support: No upper extremity supported;Feet supported Sitting balance-Leahy Scale: Good Sitting balance - Comments: sitting reaching within BOS   Standing balance support: No upper extremity supported Standing balance-Leahy Scale: Good Standing balance comment: standing reaching within BOS                             Pertinent Vitals/Pain Pain Assessment: No/denies pain  HR 71-76 bpm during session.    Home Living Family/patient expects to be discharged to:: Private residence Living Arrangements: Children (Pt's son) Available Help at Discharge: Family;Available PRN/intermittently Type of Home: Apartment (2nd floor apt) Home Access: Stairs to enter Entrance Stairs-Rails: Left Entrance Stairs-Number of Steps: 8 plus 8 with L railing ascending Home Layout: One level Home Equipment: Walker - 4 wheels;Grab bars - toilet      Prior Function Level of Independence: Independent with assistive device(s)         Comments: Ambulates with rollator; reports 1 fall onto knee in past 6 months (no injury)  Hand Dominance        Extremity/Trunk Assessment   Upper Extremity Assessment Upper Extremity Assessment: Generalized weakness    Lower Extremity Assessment Lower Extremity Assessment: Generalized weakness    Cervical / Trunk Assessment Cervical / Trunk Assessment: Normal  Communication   Communication: Tracheostomy  Cognition  Arousal/Alertness: Awake/alert Behavior During Therapy: WFL for tasks assessed/performed Overall Cognitive Status: Within Functional Limits for tasks assessed                                        General Comments General comments (skin integrity, edema, etc.): Pt resting in bed upon PT arrival.  Nursing cleared pt for participation in physical therapy.  Pt agreeable to PT session.    Exercises  Ambulation   Assessment/Plan    PT Assessment Patient needs continued PT services  PT Problem List Decreased strength;Decreased activity tolerance;Decreased mobility       PT Treatment Interventions DME instruction;Gait training;Stair training;Functional mobility training;Therapeutic activities;Therapeutic exercise;Balance training;Patient/family education    PT Goals (Current goals can be found in the Care Plan section)  Acute Rehab PT Goals Patient Stated Goal: to go home PT Goal Formulation: With patient Time For Goal Achievement: 10/16/17 Potential to Achieve Goals: Good    Frequency Min 2X/week   Barriers to discharge        Co-evaluation               AM-PAC PT "6 Clicks" Daily Activity  Outcome Measure Difficulty turning over in bed (including adjusting bedclothes, sheets and blankets)?: None Difficulty moving from lying on back to sitting on the side of the bed? : None Difficulty sitting down on and standing up from a chair with arms (e.g., wheelchair, bedside commode, etc,.)?: A Little Help needed moving to and from a bed to chair (including a wheelchair)?: A Little Help needed walking in hospital room?: A Little Help needed climbing 3-5 steps with a railing? : A Little 6 Click Score: 20    End of Session Equipment Utilized During Treatment: Gait belt Activity Tolerance: Patient tolerated treatment well Patient left: in chair;with call bell/phone within reach;with chair alarm set Nurse Communication: Mobility status;Precautions PT Visit  Diagnosis: Muscle weakness (generalized) (M62.81);History of falling (Z91.81)    Time: 7544-9201 PT Time Calculation (min) (ACUTE ONLY): 28 min   Charges:   PT Evaluation $PT Eval Low Complexity: 1 Low PT Treatments $Therapeutic Exercise: 8-22 mins   PT G Codes:   PT G-Codes **NOT FOR INPATIENT CLASS** Functional Assessment Tool Used: AM-PAC 6 Clicks Basic Mobility Functional Limitation: Mobility: Walking and moving around Mobility: Walking and Moving Around Current Status (E0712): At least 20 percent but less than 40 percent impaired, limited or restricted Mobility: Walking and Moving Around Goal Status [phone]): 0 percent impaired, limited or restricted   Leitha Bleak, PT 10/02/17, 2:36 PM [phone]

## 2017-10-03 LAB — HIV ANTIBODY (ROUTINE TESTING W REFLEX): HIV SCREEN 4TH GENERATION: NONREACTIVE

## 2017-10-03 LAB — CBC
HCT: 25.9 % — ABNORMAL LOW (ref 35.0–47.0)
Hemoglobin: 8.7 g/dL — ABNORMAL LOW (ref 12.0–16.0)
MCH: 30.2 pg (ref 26.0–34.0)
MCHC: 33.5 g/dL (ref 32.0–36.0)
MCV: 90.1 fL (ref 80.0–100.0)
PLATELETS: 221 10*3/uL (ref 150–440)
RBC: 2.88 MIL/uL — AB (ref 3.80–5.20)
RDW: 17.3 % — AB (ref 11.5–14.5)
WBC: 14.5 10*3/uL — ABNORMAL HIGH (ref 3.6–11.0)

## 2017-10-03 LAB — PHOSPHORUS: Phosphorus: 3.5 mg/dL (ref 2.5–4.6)

## 2017-10-03 LAB — GLUCOSE, CAPILLARY
GLUCOSE-CAPILLARY: 184 mg/dL — AB (ref 65–99)
Glucose-Capillary: 110 mg/dL — ABNORMAL HIGH (ref 65–99)

## 2017-10-03 MED ORDER — HEPARIN SODIUM (PORCINE) 1000 UNIT/ML DIALYSIS
1000.0000 [IU] | INTRAMUSCULAR | Status: DC | PRN
  Filled 2017-10-03: qty 1

## 2017-10-03 MED ORDER — SODIUM CHLORIDE 0.9 % IV SOLN: 100.0000 mL | INTRAVENOUS | Status: DC | PRN

## 2017-10-03 MED ORDER — PENTAFLUOROPROP-TETRAFLUOROETH EX AERO
1.0000 "application " | INHALATION_SPRAY | CUTANEOUS | Status: DC | PRN
  Filled 2017-10-03: qty 30

## 2017-10-03 MED ORDER — LIDOCAINE-PRILOCAINE 2.5-2.5 % EX CREA
1.0000 "application " | TOPICAL_CREAM | CUTANEOUS | Status: DC | PRN
  Filled 2017-10-03: qty 5

## 2017-10-03 MED ORDER — ALTEPLASE 2 MG IJ SOLR: 2.0000 mg | Freq: Once | INTRAMUSCULAR | Status: DC | PRN

## 2017-10-03 MED ORDER — LIDOCAINE HCL (PF) 1 % IJ SOLN
5.0000 mL | INTRAMUSCULAR | Status: DC | PRN
  Filled 2017-10-03: qty 5

## 2017-10-03 MED ORDER — LEVOFLOXACIN 500 MG PO TABS: 500.0000 mg | ORAL_TABLET | ORAL | 0 refills | Status: AC

## 2017-10-03 MED ORDER — LEVOFLOXACIN 500 MG PO TABS: 500.0000 mg | ORAL_TABLET | ORAL | 0 refills | Status: DC

## 2017-10-03 NOTE — Progress Notes (Signed)
IV was removed. Discharge instructions, follow-up appointments, and prescriptions were provided to the pt. The pt was taken downstairs via wheelchair by RN.

## 2017-10-03 NOTE — Discharge Summary (Signed)
[place] [facility] [place] NAME: Deborah [name]    MR#:  [id number]  DATE OF BIRTH:  1956/09/02  DATE OF ADMISSION:  10/01/2017 ADMITTING PHYSICIAN: Bettey Costa, MD  DATE OF DISCHARGE: 10/03/2017  PRIMARY CARE PHYSICIAN: Dr Stark Klein    ADMISSION DIAGNOSIS:  HCAP (healthcare-associated pneumonia) [J18.9] Sepsis, due to unspecified organism ([place]) [A41.9]  DISCHARGE DIAGNOSIS:  Active Problems:   HCAP (healthcare-associated pneumonia)   SECONDARY DIAGNOSIS:   Past Medical History:  Diagnosis Date  . Diabetes mellitus   . Hyperlipidemia   . Hypertension   . MI, old   . Renal disorder    dialysls    HOSPITAL COURSE:   61 year old female with history of respiratory failure status post tracheostomy about 11 months ago, CAD and end-stage renal disease on hemodialysis recently discharged from the hospital with pneumonia and fluid overload who presents today with cough and found to have recurrent pneumonia and fluid overload.  1. Sepsis: Patient presents with leukocytosis and tachypnea due to HCAP. She was started on IV broad-spectrum antibodies.  Her symptoms of cough have improved.  She will be discharged on Levaquin renally dosed.    2. HCAP: Plan as stated above  Speech  consultant did not find the patient to have any aspiration risk.    3. Acute on chronic diastolic heart failure: Patient symptoms have improved   After IV fluids and dialysis.  4. End-stage renal disease on hemodialysis: She will continue outpatient regimen of dialysis.  5. CAD/cardiac stent: Continue atorvastatin and Plavix   6. Atrial fibrillation: Continue amiodarone and anticoagulation with Eliquis  7. Diabetes: Continue Lantusand ADA diet   8. Hypokalemia: This is improved with repletion 9. Trach care ROUTINE: Follows at [facility]    DISCHARGE CONDITIONS AND DIET:   Stable for discharge on renal/diabetic diet  CONSULTS OBTAINED:  Treatment  Team:  Anthonette Legato, MD  DRUG ALLERGIES:  No Known Allergies  DISCHARGE MEDICATIONS:   Current Discharge Medication List    START taking these medications   Details  levofloxacin (LEVAQUIN) 500 MG tablet Take 1 tablet (500 mg total) by mouth every other day. Qty: 3 tablet, Refills: 0      CONTINUE these medications which have NOT CHANGED   Details  acetaminophen (TYLENOL) 325 MG tablet Take 650 mg by mouth.    albuterol (PROVENTIL) (2.5 MG/3ML) 0.083% nebulizer solution Take 3 mLs (2.5 mg total) by nebulization 4 (four) times daily - after meals and at bedtime. Qty: 150 mL, Refills: 1    ALPRAZolam (XANAX) 0.25 MG tablet Take 1 tablet (0.25 mg total) by mouth 2 (two) times daily as needed for anxiety. Qty: 30 tablet, Refills: 0    amiodarone (PACERONE) 200 MG tablet Take 1 tablet (200 mg total) by mouth daily. Qty: 30 tablet, Refills: 0    apixaban (ELIQUIS) 2.5 MG TABS tablet Take 2.5 mg by mouth 2 (two) times daily.     atorvastatin (LIPITOR) 80 MG tablet Take 80 mg by mouth daily at 6 PM.     B Complex-C-Folic Acid (VOL-CARE RX PO) Take 1 tablet by mouth daily.    Blood Glucose Monitoring Suppl (GLUCOCOM BLOOD GLUCOSE MONITOR) DEVI Use to check blood sugars 4-6 times per day Disp per insurance company kit ICD-10 E11.9    clopidogrel (PLAVIX) 75 MG tablet Take 75 mg by mouth daily.    furosemide (LASIX) 40 MG tablet Take 40 mg by mouth daily. ON mornings of dialysis  gabapentin (NEURONTIN) 100 MG capsule Take 1 capsule (100 mg total) by mouth 2 (two) times daily. Qty: 60 capsule, Refills: 1    insulin glargine (LANTUS) 100 UNIT/ML injection Inject 14 Units into the skin at bedtime.    Melatonin 3 MG TABS Take 3-6 mg by mouth at bedtime.     midodrine (PROAMATINE) 5 MG tablet Take 5 mg by mouth 3 (three) times a week.    pantoprazole (PROTONIX) 40 MG tablet Take 1 tablet (40 mg total) by mouth 2 (two) times daily. Qty: 60 tablet, Refills: 0    polyethylene  glycol (MIRALAX / GLYCOLAX) packet Take 17 g by mouth daily.    QUEtiapine (SEROQUEL) 50 MG tablet Take 25 mg by mouth 3 (three) times daily as needed.     sevelamer carbonate (RENVELA) 800 MG tablet Take 1,600 mg by mouth 3 (three) times daily with meals.    sodium chloride HYPERTONIC 3 % nebulizer solution Inhale 4 mL by nebulization every 6 (six) hours.    NONFORMULARY OR COMPOUNDED ITEM See pharmacy note Qty: 120 each, Refills: 2      STOP taking these medications     amoxicillin-clavulanate (AUGMENTIN) 875-125 MG tablet      metoCLOPramide (REGLAN) 5 MG tablet           Today   CHIEF COMPLAINT:   No acute issues overnight.  Patient has a cough however much improved since admission.   VITAL SIGNS:  Blood pressure (!) 146/66, pulse 64, temperature 98.3 F (36.8 C), temperature source Oral, resp. rate 13, height 5' 1"  (1.549 m), weight 63 kg (138 lb 14.2 oz), SpO2 100 %.   REVIEW OF SYSTEMS:  Review of Systems  Constitutional: Negative.  Negative for chills, fever and malaise/fatigue.  HENT: Negative.  Negative for ear discharge, ear pain, hearing loss, nosebleeds and sore throat.   Eyes: Negative.  Negative for blurred vision and pain.  Respiratory: Negative.  Negative for cough, hemoptysis, shortness of breath and wheezing.   Cardiovascular: Negative.  Negative for chest pain, palpitations and leg swelling.  Gastrointestinal: Negative.  Negative for abdominal pain, blood in stool, diarrhea, nausea and vomiting.  Genitourinary: Negative.  Negative for dysuria.  Musculoskeletal: Negative.  Negative for back pain.  Skin: Negative.   Neurological: Negative for dizziness, tremors, speech change, focal weakness, seizures and headaches.  Endo/Heme/Allergies: Negative.  Does not bruise/bleed easily.  Psychiatric/Behavioral: Negative.  Negative for depression, hallucinations and suicidal ideas.     PHYSICAL EXAMINATION:  GENERAL:  61 y.o.-year-old patient lying in the  bed with no acute distress.  NECK:  Supple, no jugular venous distention. No thyroid enlargement, no tenderness.  Has trach LUNGS: Normal breath sounds bilaterally, no wheezing, rales,rhonchi  No use of accessory muscles of respiration.  CARDIOVASCULAR: S1, S2 normal. No murmurs, rubs, or gallops.  ABDOMEN: Soft, non-tender, non-distended. Bowel sounds present. No organomegaly or mass.  EXTREMITIES: No pedal edema, cyanosis, or clubbing.  PSYCHIATRIC: The patient is alert and oriented x 3.  SKIN: No obvious rash, lesion, or ulcer.   DATA REVIEW:   CBC  Recent Labs Lab 10/03/17 0528  WBC 14.5*  HGB 8.7*  HCT 25.9*  PLT 221    Chemistries   Recent Labs Lab 10/02/17 0437  NA 134*  K 3.6  CL 97*  CO2 28  GLUCOSE 282*  BUN 27*  CREATININE 3.87*  CALCIUM 8.5*    Cardiac Enzymes No results for input(s): TROPONINI in the last 168 hours.  Microbiology Results  @  [id number]@  RADIOLOGY:  Dg Chest 2 View  Result Date: 10/01/2017 CLINICAL DATA:  Shortness of breath.  Chronic renal failure EXAM: CHEST  2 VIEW COMPARISON:  September 09, 2017 FINDINGS: There is airspace consolidation in both lung bases, felt to represent multifocal pneumonia. Lungs elsewhere clear. There is cardiomegaly with mild pulmonary venous hypertension. Patient is status post mitral valve replacement. Tracheostomy is present with catheter tip 5.6 cm above the carina. No bone lesions. No pneumothorax. IMPRESSION: Bilateral lower lobe airspace consolidation consistent with multifocal pneumonia. Somewhat greater consolidation noted in the bases compared to most recent study. Cardiomegaly with pulmonary venous hypertension, findings indicative of a degree of pulmonary vascular congestion. Tracheostomy as described without pneumothorax. Followup PA and lateral chest radiographs recommended in 3-4 weeks following trial of antibiotic therapy to ensure resolution and exclude underlying malignancy. Electronically Signed    By: Lowella Grip III M.D.   On: 10/01/2017 16:34      Current Discharge Medication List    START taking these medications   Details  levofloxacin (LEVAQUIN) 500 MG tablet Take 1 tablet (500 mg total) by mouth every other day. Qty: 3 tablet, Refills: 0      CONTINUE these medications which have NOT CHANGED   Details  acetaminophen (TYLENOL) 325 MG tablet Take 650 mg by mouth.    albuterol (PROVENTIL) (2.5 MG/3ML) 0.083% nebulizer solution Take 3 mLs (2.5 mg total) by nebulization 4 (four) times daily - after meals and at bedtime. Qty: 150 mL, Refills: 1    ALPRAZolam (XANAX) 0.25 MG tablet Take 1 tablet (0.25 mg total) by mouth 2 (two) times daily as needed for anxiety. Qty: 30 tablet, Refills: 0    amiodarone (PACERONE) 200 MG tablet Take 1 tablet (200 mg total) by mouth daily. Qty: 30 tablet, Refills: 0    apixaban (ELIQUIS) 2.5 MG TABS tablet Take 2.5 mg by mouth 2 (two) times daily.     atorvastatin (LIPITOR) 80 MG tablet Take 80 mg by mouth daily at 6 PM.     B Complex-C-Folic Acid (VOL-CARE RX PO) Take 1 tablet by mouth daily.    Blood Glucose Monitoring Suppl (GLUCOCOM BLOOD GLUCOSE MONITOR) DEVI Use to check blood sugars 4-6 times per day Disp per insurance company kit ICD-10 E11.9    clopidogrel (PLAVIX) 75 MG tablet Take 75 mg by mouth daily.    furosemide (LASIX) 40 MG tablet Take 40 mg by mouth daily. ON mornings of dialysis    gabapentin (NEURONTIN) 100 MG capsule Take 1 capsule (100 mg total) by mouth 2 (two) times daily. Qty: 60 capsule, Refills: 1    insulin glargine (LANTUS) 100 UNIT/ML injection Inject 14 Units into the skin at bedtime.    Melatonin 3 MG TABS Take 3-6 mg by mouth at bedtime.     midodrine (PROAMATINE) 5 MG tablet Take 5 mg by mouth 3 (three) times a week.    pantoprazole (PROTONIX) 40 MG tablet Take 1 tablet (40 mg total) by mouth 2 (two) times daily. Qty: 60 tablet, Refills: 0    polyethylene glycol (MIRALAX / GLYCOLAX) packet  Take 17 g by mouth daily.    QUEtiapine (SEROQUEL) 50 MG tablet Take 25 mg by mouth 3 (three) times daily as needed.     sevelamer carbonate (RENVELA) 800 MG tablet Take 1,600 mg by mouth 3 (three) times daily with meals.    sodium chloride HYPERTONIC 3 % nebulizer solution Inhale 4 mL by nebulization every 6 (six) hours.  NONFORMULARY OR COMPOUNDED ITEM See pharmacy note Qty: 120 each, Refills: 2      STOP taking these medications     amoxicillin-clavulanate (AUGMENTIN) 875-125 MG tablet      metoCLOPramide (REGLAN) 5 MG tablet            Management plans discussed with the patient and she is in agreement. Stable for discharge home  Patient should follow up with pcp  CODE STATUS:     Code Status Orders        Start     Ordered   10/01/17 1943  Full code  Continuous     10/01/17 1942    Code Status History    Date Active Date Inactive Code Status Order ID Comments User Context   09/08/2017  4:19 AM 09/10/2017  8:37 PM Full Code [id number]  Harrie Foreman, MD Inpatient   11/14/2016  1:47 PM 11/26/2016  5:31 PM Full Code [id number]  Corrie Mckusick, DO Inpatient   11/02/2016  9:16 PM 11/14/2016  1:47 PM Full Code [id number]  Corey Harold, NP ED   10/11/2016  4:35 PM 11/01/2016 10:00 PM Full Code [id number]  Bary Leriche, PA-C Inpatient   10/11/2016  4:35 PM 10/11/2016  4:35 PM Full Code [id number]  Bary Leriche, PA-C Inpatient   08/18/2016  2:56 PM 08/31/2016  8:38 AM Full Code [id number]  Shirley Friar, PA-C Inpatient   08/17/2016  7:58 AM 08/18/2016 11:51 AM Full Code [id number]  Flora Lipps, MD Inpatient   08/15/2016  4:16 PM 08/15/2016  7:06 PM Full Code [id number]  Yolonda Kida, MD Inpatient      TOTAL TIME TAKING CARE OF THIS PATIENT: 37 minutes.    Note: This dictation was prepared with Dragon dictation along with smaller phrase technology. Any transcriptional errors that result from this process are unintentional.  Kattia Selley M.D on 10/03/2017  at 10:11 AM  Between 7am to 6pm - Pager - [phone] After 6pm go to [email or web address] - password [email or web address] [place] [facility]  Office  [phone]  CC: Primary care physician; System, Pcp Not In

## 2017-10-03 NOTE — Progress Notes (Signed)
HD initiated via L AVG without issue using 15g needles x2. Access secured. Patient given warm blanket. Continue to monitor.

## 2017-10-03 NOTE — Care Management (Signed)
Patient to discharge home today.  Deborah [name] HD liaison notified of discharge. Patient confirms that her home aide services is PHI paid for my Medicaid.  Patient will not require home health services at discharge.  RNCM signing off.

## 2017-10-03 NOTE — Progress Notes (Signed)
Inpatient Diabetes Program Recommendations  AACE/ADA: New Consensus Statement on Inpatient Glycemic Control (2015)  Target Ranges:  Prepandial:   less than 140 mg/dL      Peak postprandial:   less than 180 mg/dL (1-2 hours)      Critically ill patients:  140 - 180 mg/dL   Lab Results  Component Value Date   GLUCAP 184 (H) 10/03/2017   HGBA1C 8.4 (H) 09/08/2017    Review of Glycemic Control  Results for Deborah [name], Deborah [name] (MRN [id number]) as of 10/03/2017 09:55  Ref. Range 10/02/2017 07:39 10/02/2017 11:34 10/02/2017 16:36 10/02/2017 21:20 10/03/2017 07:45  Glucose-Capillary Latest Ref Range: 65 - 99 mg/dL 286 (H) 321 (H) 377 (H) 240 (H) 184 (H)    Diabetes history: Type 2 Outpatient Diabetes medications: Lantus 14 units qhs Current orders for Inpatient glycemic control: Lantus 14units qhs, Novolog 0-9 units tid, Novolog 0-5 units qhs  Inpatient Diabetes Program Recommendations:Consider increasing Lantus to 16 units qhs (fasting blood sugar remains elevated)  Gentry Fitz, RN, BA, [place], CDE Diabetes Coordinator Inpatient Diabetes Program  [phone] (Team Pager) [phone] ([place]) 10/03/2017 9:57 AM

## 2017-10-03 NOTE — Progress Notes (Signed)
PT Cancellation Note  Patient Details Name: CANDIA KINGSBURY MRN: [id number] DOB: 1956/07/25   Cancelled Treatment:    Reason Eval/Treat Not Completed: Patient at procedure or test/unavailable.  Pt currently off floor for dialysis.  Will re-attempt PT treatment session at a later date/time.  Leitha Bleak, PT 10/03/17, 10:35 AM [phone]

## 2017-10-03 NOTE — Progress Notes (Signed)
Pre HD  

## 2017-10-03 NOTE — Progress Notes (Signed)
Post HD assessment unchanged Patient denies pain

## 2017-10-03 NOTE — Progress Notes (Signed)
HD completed without issue. Patient tolerated well. UF 2L as ordered.

## 2017-10-03 NOTE — Progress Notes (Signed)
[facility], [place] 10/03/17  Subjective:  Patient seen and evaluated during hemodialysis. Appears to be tolerating well. Still having a bit of cough.  Objective:  Vital signs in last 24 hours:  Temp:  [98.2 F (36.8 C)-98.3 F (36.8 C)] 98.3 F (36.8 C) (10/31 0933) Pulse Rate:  [63-72] 63 (10/31 1145) Resp:  [12-18] 13 (10/31 1145) BP: (110-166)/(51-74) 166/68 (10/31 1145) SpO2:  [93 %-100 %] 100 % (10/31 1145) Weight:  [63 kg (138 lb 14.2 oz)-64 kg (141 lb 1.5 oz)] 63 kg (138 lb 14.2 oz) (10/31 0933)  Weight change: 0.043 kg (1.5 oz) Filed Weights   10/01/17 1425 10/03/17 0446 10/03/17 0933  Weight: 64 kg (141 lb) 64 kg (141 lb 1.5 oz) 63 kg (138 lb 14.2 oz)    Intake/Output:    Intake/Output Summary (Last 24 hours) at 10/03/17 1159 Last data filed at 10/03/17 0848  Gross per 24 hour  Intake              290 ml  Output              400 ml  Net             -110 ml     Physical Exam: General: NAD, laying in bed  HEENT anicteric  Neck Trach in place  Pulm/lungs Basilar rales, normal effort  CVS/Heart irregular, soft systolic murmur  Abdomen:  Soft, NTND  Extremities: No edema  Neurologic: Alert, oriented, follows commands  Skin: No acute rashes  Access: AVF       Basic Metabolic Panel:   Recent Labs Lab 10/01/17 1704 10/02/17 0437 10/03/17 0949  NA 136 134*  --   K 3.3* 3.6  --   CL 95* 97*  --   CO2 29 28  --   GLUCOSE 124* 282*  --   BUN 13 27*  --   CREATININE 2.76* 3.87*  --   CALCIUM 9.0 8.5*  --   PHOS  --   --  3.5     CBC:  Recent Labs Lab 10/01/17 1704 10/02/17 0437 10/03/17 0528  WBC 16.3* 18.0* 14.5*  NEUTROABS 13.6*  --   --   HGB 9.8* 8.9* 8.7*  HCT 29.5* 27.7* 25.9*  MCV 88.6 89.7 90.1  PLT 261 249 221      Lab Results  Component Value Date   HEPBSAG Negative 11/14/2016   HEPBSAB Non Reactive 08/17/2016   HEPBIGM Negative 08/26/2016      Microbiology:  Recent Results (from the  past 240 hour(s))  Blood Culture (routine x 2)     Status: None (Preliminary result)   Collection Time: 10/01/17  6:23 PM  Result Value Ref Range Status   Specimen Description BLOOD MYCOBACTERIUM AVIUM COMPLEX  Final   Special Requests AEROBIC BOTTLE ONLY Blood Culture adequate volume  Final   Culture NO GROWTH 2 DAYS  Final   Report Status PENDING  Incomplete  Blood Culture (routine x 2)     Status: None (Preliminary result)   Collection Time: 10/01/17  6:50 PM  Result Value Ref Range Status   Specimen Description BLOOD RESISTANT FOREARM  Final   Special Requests   Final    BOTTLES DRAWN AEROBIC AND ANAEROBIC Blood Culture results may not be optimal due to an inadequate volume of blood received in culture bottles   Culture NO GROWTH 2 DAYS  Final   Report Status PENDING  Incomplete    Coagulation Studies: No  results for input(s): LABPROT, INR in the last 72 hours.  Urinalysis: No results for input(s): COLORURINE, LABSPEC, PHURINE, GLUCOSEU, HGBUR, BILIRUBINUR, KETONESUR, PROTEINUR, UROBILINOGEN, NITRITE, LEUKOCYTESUR in the last 72 hours.  Invalid input(s): APPERANCEUR    Imaging: Dg Chest 2 View  Result Date: 10/01/2017 CLINICAL DATA:  Shortness of breath.  Chronic renal failure EXAM: CHEST  2 VIEW COMPARISON:  September 09, 2017 FINDINGS: There is airspace consolidation in both lung bases, felt to represent multifocal pneumonia. Lungs elsewhere clear. There is cardiomegaly with mild pulmonary venous hypertension. Patient is status post mitral valve replacement. Tracheostomy is present with catheter tip 5.6 cm above the carina. No bone lesions. No pneumothorax. IMPRESSION: Bilateral lower lobe airspace consolidation consistent with multifocal pneumonia. Somewhat greater consolidation noted in the bases compared to most recent study. Cardiomegaly with pulmonary venous hypertension, findings indicative of a degree of pulmonary vascular congestion. Tracheostomy as described without  pneumothorax. Followup PA and lateral chest radiographs recommended in 3-4 weeks following trial of antibiotic therapy to ensure resolution and exclude underlying malignancy. Electronically Signed   By: Lowella Grip III M.D.   On: 10/01/2017 16:34     Medications:   . sodium chloride    . sodium chloride    . azithromycin (ZITHROMAX) 500 MG IVPB    . ceFEPime (MAXIPIME) IV Stopped (10/02/17 1802)  . vancomycin     . amiodarone  200 mg Oral Daily  . apixaban  2.5 mg Oral BID  . atorvastatin  80 mg Oral q1800  . chlorhexidine  15 mL Mouth Rinse BID  . clopidogrel  75 mg Oral Daily  . furosemide  40 mg Oral Daily  . gabapentin  100 mg Oral BID  . insulin aspart  0-5 Units Subcutaneous QHS  . insulin aspart  0-9 Units Subcutaneous TID WC  . insulin glargine  14 Units Subcutaneous QHS  . mouth rinse  15 mL Mouth Rinse q12n4p  . Melatonin  2.5-5 mg Oral QHS  . metoCLOPramide  5 mg Oral BID WC  . pantoprazole  40 mg Oral BID AC  . polyethylene glycol  17 g Oral Daily  . sevelamer carbonate  1,600 mg Oral TID WC   sodium chloride, sodium chloride, acetaminophen **OR** acetaminophen, ALPRAZolam, alteplase, bisacodyl, heparin, hydrALAZINE, lidocaine (PF), lidocaine-prilocaine, ondansetron **OR** ondansetron (ZOFRAN) IV, pentafluoroprop-tetrafluoroeth, polyethylene glycol  Assessment/ Plan:  61 y.o.African American female with h/o Ac resp failure requiring trrach 08/2016, CAD, (08/2016) STEMI with papillary muscle rupture requiring surgical repair of mitral valve, A Fib, h/o cardioversion, ESRD, MRSA bactermia, DM-2 since 2011, h/o Gastric tube now removed;   [facility]/ [address]/ MWF/[facility]  1. End-stage renal disease on HD MWF 2. Anemia of chronic kidney disease, hgb 8.7 3. Bilateral bacterial pneumonia 4. Secondary hyperparathyroidism, phosphorus 3.5  Plan: Patient seen and evaluated during hemodialysis today. She appears to be tolerating this quite well.  We plan to  complete hemodialysis today. Her next dialysis will most likely be Friday as an outpatient. Continue erythropoietin stimulating agents as an outpatient. Management of pneumonia as per hospitalist.  Phosphorous under good control and we recommended the patient continue on Renvela 1600 mg by mouth 3 times a day as an outpatient.   LOS: 2 Jonatha Gagen, [place] 10/31/201811:59 AM  [place] [facility], [place]

## 2017-10-03 NOTE — Progress Notes (Signed)
Pre hd Patient currently without complaint. Lungs diminished bilaterally. No pitting edema.

## 2017-10-04 LAB — PARATHYROID HORMONE, INTACT (NO CA): PTH: 270 pg/mL — AB (ref 15–65)

## 2017-10-05 ENCOUNTER — Observation Stay
Admission: EM | Admit: 2017-10-05 | Discharge: 2017-10-06 | Disposition: A | Discharge status: Home / Self Care | Payer: Medicaid Other | Attending: Vascular Surgery | Admitting: Vascular Surgery

## 2017-10-05 ENCOUNTER — Encounter
Admission: EM | Disposition: A | Discharge status: Home / Self Care | Payer: Self-pay | Source: Home / Self Care | Attending: Emergency Medicine

## 2017-10-05 ENCOUNTER — Encounter: Payer: Self-pay | Admitting: *Deleted

## 2017-10-05 DIAGNOSIS — J9611 Chronic respiratory failure with hypoxia: Secondary | ICD-10-CM | POA: Insufficient documentation

## 2017-10-05 DIAGNOSIS — T82858A Stenosis of vascular prosthetic devices, implants and grafts, initial encounter: Secondary | ICD-10-CM | POA: Insufficient documentation

## 2017-10-05 DIAGNOSIS — Z955 Presence of coronary angioplasty implant and graft: Secondary | ICD-10-CM | POA: Diagnosis not present

## 2017-10-05 DIAGNOSIS — T82868A Thrombosis of vascular prosthetic devices, implants and grafts, initial encounter: Secondary | ICD-10-CM | POA: Diagnosis not present

## 2017-10-05 DIAGNOSIS — E1122 Type 2 diabetes mellitus with diabetic chronic kidney disease: Secondary | ICD-10-CM | POA: Diagnosis not present

## 2017-10-05 DIAGNOSIS — T82898A Other specified complication of vascular prosthetic devices, implants and grafts, initial encounter: Principal | ICD-10-CM | POA: Insufficient documentation

## 2017-10-05 DIAGNOSIS — Z7901 Long term (current) use of anticoagulants: Secondary | ICD-10-CM | POA: Insufficient documentation

## 2017-10-05 DIAGNOSIS — Z7902 Long term (current) use of antithrombotics/antiplatelets: Secondary | ICD-10-CM | POA: Insufficient documentation

## 2017-10-05 DIAGNOSIS — I2510 Atherosclerotic heart disease of native coronary artery without angina pectoris: Secondary | ICD-10-CM | POA: Insufficient documentation

## 2017-10-05 DIAGNOSIS — R58 Hemorrhage, not elsewhere classified: Secondary | ICD-10-CM

## 2017-10-05 DIAGNOSIS — Z992 Dependence on renal dialysis: Secondary | ICD-10-CM | POA: Insufficient documentation

## 2017-10-05 DIAGNOSIS — T82838A Hemorrhage of vascular prosthetic devices, implants and grafts, initial encounter: Secondary | ICD-10-CM | POA: Diagnosis present

## 2017-10-05 DIAGNOSIS — D631 Anemia in chronic kidney disease: Secondary | ICD-10-CM | POA: Diagnosis not present

## 2017-10-05 DIAGNOSIS — F4322 Adjustment disorder with anxiety: Secondary | ICD-10-CM | POA: Insufficient documentation

## 2017-10-05 DIAGNOSIS — E785 Hyperlipidemia, unspecified: Secondary | ICD-10-CM | POA: Diagnosis not present

## 2017-10-05 DIAGNOSIS — I252 Old myocardial infarction: Secondary | ICD-10-CM | POA: Insufficient documentation

## 2017-10-05 DIAGNOSIS — Z794 Long term (current) use of insulin: Secondary | ICD-10-CM | POA: Insufficient documentation

## 2017-10-05 DIAGNOSIS — Z952 Presence of prosthetic heart valve: Secondary | ICD-10-CM | POA: Insufficient documentation

## 2017-10-05 DIAGNOSIS — Y832 Surgical operation with anastomosis, bypass or graft as the cause of abnormal reaction of the patient, or of later complication, without mention of misadventure at the time of the procedure: Secondary | ICD-10-CM | POA: Diagnosis not present

## 2017-10-05 DIAGNOSIS — I130 Hypertensive heart and chronic kidney disease with heart failure and stage 1 through stage 4 chronic kidney disease, or unspecified chronic kidney disease: Secondary | ICD-10-CM | POA: Insufficient documentation

## 2017-10-05 DIAGNOSIS — T829XXA Unspecified complication of cardiac and vascular prosthetic device, implant and graft, initial encounter: Secondary | ICD-10-CM | POA: Diagnosis present

## 2017-10-05 DIAGNOSIS — N186 End stage renal disease: Secondary | ICD-10-CM | POA: Diagnosis not present

## 2017-10-05 DIAGNOSIS — I5032 Chronic diastolic (congestive) heart failure: Secondary | ICD-10-CM | POA: Diagnosis not present

## 2017-10-05 DIAGNOSIS — E11649 Type 2 diabetes mellitus with hypoglycemia without coma: Secondary | ICD-10-CM | POA: Insufficient documentation

## 2017-10-05 DIAGNOSIS — E119 Type 2 diabetes mellitus without complications: Secondary | ICD-10-CM | POA: Diagnosis not present

## 2017-10-05 DIAGNOSIS — I10 Essential (primary) hypertension: Secondary | ICD-10-CM | POA: Diagnosis not present

## 2017-10-05 HISTORY — PX: A/V FISTULAGRAM: CATH118298

## 2017-10-05 HISTORY — PX: A/V SHUNTOGRAM: CATH118297

## 2017-10-05 LAB — CBC WITH DIFFERENTIAL/PLATELET
BASOS ABS: 0.1 10*3/uL (ref 0–0.1)
BASOS PCT: 1 %
EOS PCT: 3 %
Eosinophils Absolute: 0.3 10*3/uL (ref 0–0.7)
HEMATOCRIT: 29.4 % — AB (ref 35.0–47.0)
Hemoglobin: 9.8 g/dL — ABNORMAL LOW (ref 12.0–16.0)
Lymphocytes Relative: 17 %
Lymphs Abs: 1.4 10*3/uL (ref 1.0–3.6)
MCH: 30.3 pg (ref 26.0–34.0)
MCHC: 33.4 g/dL (ref 32.0–36.0)
MCV: 90.7 fL (ref 80.0–100.0)
MONO ABS: 1 10*3/uL — AB (ref 0.2–0.9)
Monocytes Relative: 12 %
NEUTROS ABS: 5.6 10*3/uL (ref 1.4–6.5)
Neutrophils Relative %: 67 %
PLATELETS: 245 10*3/uL (ref 150–440)
RBC: 3.24 MIL/uL — ABNORMAL LOW (ref 3.80–5.20)
RDW: 17.3 % — AB (ref 11.5–14.5)
WBC: 8.3 10*3/uL (ref 3.6–11.0)

## 2017-10-05 LAB — APTT: APTT: 33 s (ref 24–36)

## 2017-10-05 LAB — GLUCOSE, CAPILLARY: Glucose-Capillary: 195 mg/dL — ABNORMAL HIGH (ref 65–99)

## 2017-10-05 LAB — PROTIME-INR
INR: 1.56
PROTHROMBIN TIME: 18.5 s — AB (ref 11.4–15.2)

## 2017-10-05 SURGERY — A/V SHUNTOGRAM
Anesthesia: Moderate Sedation

## 2017-10-05 MED ORDER — FENTANYL CITRATE (PF) 100 MCG/2ML IJ SOLN
INTRAMUSCULAR | Status: AC
  Filled 2017-10-05: qty 2

## 2017-10-05 MED ORDER — ALPRAZOLAM 0.5 MG PO TABS: 0.2500 mg | ORAL_TABLET | Freq: Two times a day (BID) | ORAL | Status: DC | PRN

## 2017-10-05 MED ORDER — GABAPENTIN 100 MG PO CAPS
100.0000 mg | ORAL_CAPSULE | Freq: Two times a day (BID) | ORAL | Status: DC
  Administered 2017-10-05 – 2017-10-06 (×2): 100 mg via ORAL
  Filled 2017-10-05 (×2): qty 1

## 2017-10-05 MED ORDER — CEFAZOLIN SODIUM-DEXTROSE 2-4 GM/100ML-% IV SOLN: 2.0000 g | INTRAVENOUS | Status: DC

## 2017-10-05 MED ORDER — ACETAMINOPHEN 650 MG RE SUPP: 650.0000 mg | Freq: Four times a day (QID) | RECTAL | Status: DC | PRN

## 2017-10-05 MED ORDER — LIDOCAINE HCL (PF) 1 % IJ SOLN
5.0000 mL | Freq: Once | INTRAMUSCULAR | Status: AC
  Administered 2017-10-05: 5 mL via INTRADERMAL

## 2017-10-05 MED ORDER — MIDODRINE HCL 5 MG PO TABS
5.0000 mg | ORAL_TABLET | ORAL | Status: DC
  Filled 2017-10-05: qty 1

## 2017-10-05 MED ORDER — AMIODARONE HCL 200 MG PO TABS
200.0000 mg | ORAL_TABLET | Freq: Every day | ORAL | Status: DC
  Administered 2017-10-06: 200 mg via ORAL
  Filled 2017-10-05 (×2): qty 1

## 2017-10-05 MED ORDER — LEVOFLOXACIN 500 MG PO TABS: 500.0000 mg | ORAL_TABLET | ORAL | Status: DC

## 2017-10-05 MED ORDER — HEPARIN SODIUM (PORCINE) 1000 UNIT/ML IJ SOLN
INTRAMUSCULAR | Status: AC
  Filled 2017-10-05: qty 1

## 2017-10-05 MED ORDER — CEFAZOLIN SODIUM 1 G IJ SOLR
INTRAMUSCULAR | Status: AC
  Administered 2017-10-05: 19:00:00 via INTRAVENOUS
  Filled 2017-10-05: qty 20

## 2017-10-05 MED ORDER — CLOPIDOGREL BISULFATE 75 MG PO TABS
75.0000 mg | ORAL_TABLET | Freq: Every day | ORAL | Status: DC
  Administered 2017-10-06: 75 mg via ORAL
  Filled 2017-10-05: qty 1

## 2017-10-05 MED ORDER — IOPAMIDOL (ISOVUE-300) INJECTION 61%
INTRAVENOUS | Status: DC | PRN
  Administered 2017-10-05: 35 mL via INTRAVENOUS

## 2017-10-05 MED ORDER — ALBUTEROL SULFATE (2.5 MG/3ML) 0.083% IN NEBU
2.5000 mg | INHALATION_SOLUTION | Freq: Three times a day (TID) | RESPIRATORY_TRACT | Status: DC
  Administered 2017-10-05 – 2017-10-06 (×2): 2.5 mg via RESPIRATORY_TRACT
  Filled 2017-10-05 (×4): qty 3

## 2017-10-05 MED ORDER — ATORVASTATIN CALCIUM 20 MG PO TABS: 80.0000 mg | ORAL_TABLET | Freq: Every day | ORAL | Status: DC

## 2017-10-05 MED ORDER — FUROSEMIDE 40 MG PO TABS
40.0000 mg | ORAL_TABLET | Freq: Every day | ORAL | Status: DC
  Administered 2017-10-06: 40 mg via ORAL
  Filled 2017-10-05: qty 1

## 2017-10-05 MED ORDER — FENTANYL CITRATE (PF) 100 MCG/2ML IJ SOLN
INTRAMUSCULAR | Status: DC | PRN
  Administered 2017-10-05: 50 ug via INTRAVENOUS

## 2017-10-05 MED ORDER — POLYETHYLENE GLYCOL 3350 17 G PO PACK: 17.0000 g | PACK | Freq: Every day | ORAL | Status: DC | PRN

## 2017-10-05 MED ORDER — MIDAZOLAM HCL 5 MG/5ML IJ SOLN
INTRAMUSCULAR | Status: AC
  Filled 2017-10-05: qty 5

## 2017-10-05 MED ORDER — ACETAMINOPHEN 325 MG PO TABS: 650.0000 mg | ORAL_TABLET | Freq: Four times a day (QID) | ORAL | Status: DC | PRN

## 2017-10-05 MED ORDER — INSULIN GLARGINE 100 UNIT/ML ~~LOC~~ SOLN
14.0000 [IU] | Freq: Every day | SUBCUTANEOUS | Status: DC
  Administered 2017-10-05: 14 [IU] via SUBCUTANEOUS
  Filled 2017-10-05 (×2): qty 0.14

## 2017-10-05 MED ORDER — HYDROCODONE-ACETAMINOPHEN 5-325 MG PO TABS
1.0000 | ORAL_TABLET | ORAL | Status: DC | PRN
  Administered 2017-10-05: 1 via ORAL
  Administered 2017-10-06 (×2): 2 via ORAL
  Filled 2017-10-05 (×2): qty 2
  Filled 2017-10-05: qty 1

## 2017-10-05 MED ORDER — ONDANSETRON HCL 4 MG/2ML IJ SOLN: 4.0000 mg | Freq: Four times a day (QID) | INTRAMUSCULAR | Status: DC | PRN

## 2017-10-05 MED ORDER — POLYETHYLENE GLYCOL 3350 17 G PO PACK
17.0000 g | PACK | Freq: Every day | ORAL | Status: DC
  Filled 2017-10-05: qty 1

## 2017-10-05 MED ORDER — ONDANSETRON HCL 4 MG PO TABS
4.0000 mg | ORAL_TABLET | Freq: Four times a day (QID) | ORAL | Status: DC | PRN
  Administered 2017-10-06: 4 mg via ORAL
  Filled 2017-10-05: qty 1

## 2017-10-05 MED ORDER — CEFAZOLIN SODIUM-DEXTROSE 2-4 GM/100ML-% IV SOLN: 2.0000 g | Freq: Once | INTRAVENOUS | Status: DC

## 2017-10-05 MED ORDER — GELATIN ABSORBABLE 12-7 MM EX MISC
1.0000 | Freq: Once | CUTANEOUS | Status: DC
  Filled 2017-10-05: qty 1

## 2017-10-05 MED ORDER — SEVELAMER CARBONATE 800 MG PO TABS
1600.0000 mg | ORAL_TABLET | Freq: Three times a day (TID) | ORAL | Status: DC
  Administered 2017-10-06: 1600 mg via ORAL
  Filled 2017-10-05: qty 2

## 2017-10-05 MED ORDER — MIDAZOLAM HCL 2 MG/2ML IJ SOLN
INTRAMUSCULAR | Status: DC | PRN
  Administered 2017-10-05: 2 mg via INTRAVENOUS

## 2017-10-05 MED ORDER — MELATONIN 5 MG PO TABS
5.0000 mg | ORAL_TABLET | Freq: Every day | ORAL | Status: DC
  Administered 2017-10-05: 5 mg via ORAL
  Filled 2017-10-05 (×2): qty 1

## 2017-10-05 MED ORDER — LIDOCAINE HCL (PF) 1 % IJ SOLN
INTRAMUSCULAR | Status: AC
  Administered 2017-10-05: 5 mL via INTRADERMAL
  Filled 2017-10-05: qty 5

## 2017-10-05 MED ORDER — DOCUSATE SODIUM 100 MG PO CAPS
100.0000 mg | ORAL_CAPSULE | Freq: Two times a day (BID) | ORAL | Status: DC
  Administered 2017-10-05: 100 mg via ORAL
  Filled 2017-10-05 (×2): qty 1

## 2017-10-05 MED ORDER — PANTOPRAZOLE SODIUM 40 MG PO TBEC
40.0000 mg | DELAYED_RELEASE_TABLET | Freq: Two times a day (BID) | ORAL | Status: DC
  Administered 2017-10-05 – 2017-10-06 (×2): 40 mg via ORAL
  Filled 2017-10-05 (×2): qty 1

## 2017-10-05 SURGICAL SUPPLY — 13 items
BALLN ULTRVRSE 8X60X75C (BALLOONS) ×3
BALLOON ULTRVRSE 8X60X75C (BALLOONS) ×1 IMPLANT
COVER PROBE U/S 5X48 (MISCELLANEOUS) ×3 IMPLANT
DEVICE PRESTO INFLATION (MISCELLANEOUS) ×3 IMPLANT
NEEDLE ENTRY 21GA 7CM ECHOTIP (NEEDLE) ×3 IMPLANT
PACK ANGIOGRAPHY (CUSTOM PROCEDURE TRAY) ×3 IMPLANT
SET INTRO CAPELLA COAXIAL (SET/KITS/TRAYS/PACK) ×3 IMPLANT
SHEATH BRITE TIP 6FRX5.5 (SHEATH) ×3 IMPLANT
SHEATH BRITE TIP 7FRX5.5 (SHEATH) ×3 IMPLANT
STENT VIABAHN 8X50X120 (Permanent Stent) ×6 IMPLANT
STENT VIABAHN5X120X8X (Permanent Stent) ×2 IMPLANT
SUT MNCRL AB 4-0 PS2 18 (SUTURE) ×3 IMPLANT
WIRE G 018X200 V18 (WIRE) ×3 IMPLANT

## 2017-10-05 NOTE — Progress Notes (Signed)
Suction at bedside, extra DIC's are available at this time., ambu bag, and extra trach kit

## 2017-10-05 NOTE — Progress Notes (Signed)
Pt receiving breathing treatment at this time without signs of respiratory distress. Airway secure. Pt requested inner cannula be changed out, RT changed our IC without complication. No secretions noted at this time.

## 2017-10-05 NOTE — ED Triage Notes (Signed)
PT to ED from dialysis because left arm fistula would not stop bleeding after treatment. Bleeding has continued for 1 hour.Bleeding controlled at this time. PT is on Plavix after a clot in fistula 1 year ago.

## 2017-10-05 NOTE — Progress Notes (Signed)
Patient clinically stable post procedure, son at bedside. Eating sandwich at this time, without difficulty.bp stable, on trach collar @ room air with sats 98-100%. Denies complaints. Dr Delana Meyer out to speak with son and patient with questions answered. Patient to stay overnight in hospital and plan for discharge tomorrow as remains stable. Report called to care nurse on [place] with plans reviewed.

## 2017-10-05 NOTE — ED Provider Notes (Signed)
[facility] Emergency Department Provider Note  ____________________________________________   I have reviewed the triage vital signs and the nursing notes.   HISTORY  Chief Complaint Dialysis shunt bleeding    HPI Deborah [name] is a 61 y.o. female finished dialysis today and then when they took the needle out had ongoing bleeding.  Is controlled with pressure.   Location left fistula Radiation n/a` Quality bright red blood Duration since dialysis Timing see above Severity controlled unless pressure removed Associated sxs none not lightheaded PriorTreatment direct pressure   Past Medical History:  Diagnosis Date  . Diabetes mellitus   . Hyperlipidemia   . Hypertension   . MI, old   . Renal disorder    dialysls    Patient Active Problem List   Diagnosis Date Noted  . Sepsis ([place]) 09/08/2017  . Adjustment disorder with anxious mood 12/09/2016  . Bacteremia due to Staphylococcus aureus   . Staphylococcus aureus bacteremia with sepsis ([place])   . Uncontrolled type 2 diabetes mellitus with complication ([place])   . ST elevation myocardial infarction involving left main coronary artery ([place])   . Palliative care encounter   . Advance care planning   . Vocal cord dysfunction   . Palliative care by specialist   . Goals of care, counseling/discussion   . Bacteremia   . End-stage renal disease on hemodialysis ([place])   . Paroxysmal atrial fibrillation ([name])   . Heart attack ([place])   . Uncontrolled type 2 diabetes mellitus with complication, without long-term current use of insulin ([place])   . Staphylococcus aureus bacteremia 11/12/2016  . Vocal cord paralysis   . Respiratory distress 11/02/2016  . Chronic anticoagulation   . Acute lower UTI   . Labile blood glucose   . Leukocytosis   . Subtherapeutic international normalized ratio (INR)   . Supratherapeutic INR   . Cough   . Oropharyngeal dysphagia   . Uncontrolled type 2 diabetes mellitus with  complication, with long-term current use of insulin ([place])   . Physical debility 10/11/2016  . Metabolic encephalopathy 35/32/9924  . Debility   . ESRD on dialysis ([place])   . Type 2 diabetes mellitus with complication, with long-term current use of insulin ([place])   . Anemia of chronic disease   . Coronary artery disease involving native coronary artery of native heart without angina pectoris   . Acute on chronic respiratory failure with hypoxemia ([place])   . Atrial flutter ([place])   . Persistent cough   . Dysphonia   . Sleep disturbance   . Diabetes mellitus type 2 in nonobese ([name])   . Stage 3 chronic kidney disease ([place])   . NSTEMI (non-ST elevated myocardial infarction) ([place])   . Tachypnea   . Hyponatremia   . Lymphocytosis   . Acute blood loss anemia   . Pressure injury of skin 09/18/2016  . History of tracheostomy   . Delirium   . Encounter for attention to tracheostomy ([place])   . History of MVR with cardiopulmonary bypass   . Systolic congestive heart failure ([place])   . HCAP (healthcare-associated pneumonia)   . PAF (paroxysmal atrial fibrillation) ([place])   . Acute systolic congestive heart failure ([place])   . AKI (acute kidney injury) ([place])   . Abnormal LFTs   . S/P MVR (mitral valve replacement)   . Mitral regurgitation 08/23/2016  . Acute on chronic diastolic heart failure ([place]) 08/18/2016  . Acute pulmonary edema ([name])   . Respiratory failure ([place]) 08/17/2016  .  Cardiogenic shock ([place]) 08/17/2016  . Acute hypoxemic respiratory failure ([place])   . STEMI (ST elevation myocardial infarction) ([place]) 08/15/2016  . Noncompliance 01/25/2015  . Bereavement 07/09/2014  . Low back pain 02/17/2014  . Cervical cancer screening 11/20/2013  . Routine general medical examination at a health care facility 10/14/2013  . Chronic kidney disease, stage 3 ([place]) 06/14/2012  . Hypertension 03/08/2012  . Diabetes mellitus type 2, controlled 03/08/2012  . Hyperlipidemia 03/08/2012    Past  Surgical History:  Procedure Laterality Date  . AV FISTULA PLACEMENT Left 10/02/2016   Procedure: INSERTION OF ARTERIOVENOUS (AV) GORE-TEX GRAFT ARM;  Surgeon: Waynetta Sandy, MD;  Location: [facility];  Service: Vascular;  Laterality: Left;  . CARDIAC CATHETERIZATION N/A 08/15/2016   Procedure: Left Heart Cath and Coronary Angiography;  Surgeon: Yolonda Kida, MD;  Location: [facility];  Service: Cardiovascular;  Laterality: N/A;  . CARDIAC CATHETERIZATION N/A 08/15/2016   Procedure: Coronary Stent Intervention;  Surgeon: Yolonda Kida, MD;  Location: [facility] CV [facility];  Service: Cardiovascular;  Laterality: N/A;  . CARDIAC CATHETERIZATION N/A 08/18/2016   Procedure: Right Heart Cath;  Surgeon: Jolaine Artist, MD;  Location: [facility];  Service: Cardiovascular;  Laterality: N/A;  . CARDIAC CATHETERIZATION N/A 08/18/2016   Procedure: IABP Insertion;  Surgeon: Jolaine Artist, MD;  Location: [facility] CV LAB;  Service: Cardiovascular;  Laterality: N/A;  . CARDIAC CATHETERIZATION Right 08/23/2016   Procedure: CENTRAL LINE INSERTION RIGHT SUBCLAVIAN;  Surgeon: Ivin Poot, MD;  Location: [facility];  Service: Open Heart Surgery;  Laterality: Right;  . ENDOVEIN HARVEST OF GREATER SAPHENOUS VEIN Left 08/23/2016   Procedure: ENDOVEIN HARVEST OF GREATER SAPHENOUS VEIN;  Surgeon: Ivin Poot, MD;  Location: [facility];  Service: Open Heart Surgery;  Laterality: Left;  . INSERTION OF DIALYSIS CATHETER Bilateral 08/31/2016   Procedure: INSERTION OF DIALYSIS CATHETER LEFT INTERNAL JUGULAR VEIN & INSERTION OF TRIPLE LUMEN RIGHT INTERNAL JUGULAR VEIN;  Surgeon: Angelia Mould, MD;  Location: [facility];  Service: Vascular;  Laterality: Bilateral;  . INTRAOPERATIVE TRANSESOPHAGEAL ECHOCARDIOGRAM N/A 08/23/2016   Procedure: INTRAOPERATIVE TRANSESOPHAGEAL ECHOCARDIOGRAM;  Surgeon: Ivin Poot, MD;  Location: [facility];  Service: Open Heart Surgery;  Laterality: N/A;  . IR  GENERIC HISTORICAL  11/13/2016   IR US GUIDE VASC ACCESS LEFT 11/13/2016 Corrie Mckusick, DO [facility]  . IR GENERIC HISTORICAL  11/13/2016   IR FLUORO GUIDE CV LINE LEFT 11/13/2016 Corrie Mckusick, DO [facility]  . IR GENERIC HISTORICAL  11/13/2016   IR GASTROSTOMY TUBE MOD SED 11/13/2016 Corrie Mckusick, DO [facility]  . MITRAL VALVE REPAIR N/A 08/23/2016   Procedure: MITRAL VALVE REPAIR (MVR) USING 25MM EDWARDS MAGNA EASE BIOPROSTHESIS MITRAL  VALVE;  Surgeon: Ivin Poot, MD;  Location: [facility];  Service: Open Heart Surgery;  Laterality: N/A;  . tracheostomy reversal    . TRACHEOSTOMY TUBE PLACEMENT N/A 11/09/2016   Procedure: TRACHEOSTOMY;  Surgeon: Melissa Montane, MD;  Location: [facility];  Service: ENT;  Laterality: N/A;  . VAGINAL DELIVERY     x 6    Prior to Admission medications   Medication Sig Start Date End Date Taking? Authorizing Provider  acetaminophen (TYLENOL) 325 MG tablet Take 650 mg by mouth. 04/06/17   [provider]  albuterol (PROVENTIL) (2.5 MG/3ML) 0.083% nebulizer solution Take 3 mLs (2.5 mg total) by nebulization 4 (four) times daily - after meals and at bedtime. 11/01/16   Reesa Chew  S, PA-C  ALPRAZolam (XANAX) 0.25 MG tablet Take 1 tablet (0.25 mg total) by mouth 2 (two) times daily as needed for anxiety. 11/01/16   Love, Ivan Anchors, PA-C  amiodarone (PACERONE) 200 MG tablet Take 1 tablet (200 mg total) by mouth daily. 11/01/16   Love, Ivan Anchors, PA-C  apixaban (ELIQUIS) 2.5 MG TABS tablet Take 2.5 mg by mouth 2 (two) times daily.  06/22/17   [provider]  atorvastatin (LIPITOR) 80 MG tablet Take 80 mg by mouth daily at 6 PM.     [provider]  B Complex-C-Folic Acid (VOL-CARE RX PO) Take 1 tablet by mouth daily.    [provider]  Blood Glucose Monitoring Suppl (GLUCOCOM BLOOD GLUCOSE MONITOR) DEVI Use to check blood sugars 4-6 times per day Disp per insurance company kit ICD-10 E11.9 07/18/17   [provider]   clopidogrel (PLAVIX) 75 MG tablet Take 75 mg by mouth daily.    [provider]  furosemide (LASIX) 40 MG tablet Take 40 mg by mouth daily. ON mornings of dialysis    [provider]  gabapentin (NEURONTIN) 100 MG capsule Take 1 capsule (100 mg total) by mouth 2 (two) times daily. 08/13/17   Edrick Kins, DPM  insulin glargine (LANTUS) 100 UNIT/ML injection Inject 14 Units into the skin at bedtime.    [provider]  levofloxacin (LEVAQUIN) 500 MG tablet Take 1 tablet (500 mg total) by mouth every other day. 10/03/17 10/10/17  Demetrios Loll, MD  Melatonin 3 MG TABS Take 3-6 mg by mouth at bedtime.  04/06/17   [provider]  midodrine (PROAMATINE) 5 MG tablet Take 5 mg by mouth 3 (three) times a week.    [provider]  NONFORMULARY OR COMPOUNDED ITEM See pharmacy note 07/20/17   Edrick Kins, DPM  pantoprazole (PROTONIX) 40 MG tablet Take 1 tablet (40 mg total) by mouth 2 (two) times daily. 11/01/16   Love, Ivan Anchors, PA-C  polyethylene glycol (MIRALAX / GLYCOLAX) packet Take 17 g by mouth daily.    [provider]  QUEtiapine (SEROQUEL) 50 MG tablet Take 25 mg by mouth 3 (three) times daily as needed.  04/06/17   [provider]  sevelamer carbonate (RENVELA) 800 MG tablet Take 1,600 mg by mouth 3 (three) times daily with meals.    [provider]  sodium chloride HYPERTONIC 3 % nebulizer solution Inhale 4 mL by nebulization every 6 (six) hours. 06/22/17 06/22/18  [provider]    Allergies Patient has no known allergies.  Family History  Problem Relation Age of Onset  . Hypertension Mother   . Diabetes Mother   . Breast cancer Sister     Social History Social History  Substance Use Topics  . Smoking status: Never Smoker  . Smokeless tobacco: Never Used  . Alcohol use No    Review of Systems Constitutional: No fever/chills Eyes: No visual changes. ENT: No sore throat. No stiff neck no neck  pain Cardiovascular: Denies chest pain. Respiratory: Denies shortness of breath. Gastrointestinal:   no vomiting.  No diarrhea.  No constipation. Genitourinary: Negative for dysuria. Musculoskeletal: Negative lower extremity swelling Skin: Negative for rash. Neurological: Negative for severe headaches, focal weakness or numbness.   ____________________________________________   PHYSICAL EXAM:  VITAL SIGNS: ED Triage Vitals [10/05/17 1550]  Enc Vitals Group     BP      Pulse      Resp      Temp  Temp src      SpO2      Weight 141 lb (64 kg)     Height 5' 2"  (1.575 m)     Head Circumference      Peak Flow      Pain Score      Pain Loc      Pain Edu?      Excl. in [place]?     Constitutional: Alert and oriented. Well appearing and in no acute distress. Eyes: Conjunctivae are normal Head: Atraumatic HEENT: No congestion/rhinnorhea. Mucous membranes are moist.  Oropharynx non-erythematous Neck:   Nontender with no meningismus, no masses, trach in place Cardiovascular: Normal rate, regular rhythm. Grossly normal heart sounds.  Good peripheral circulation. Musculoskeletal: No lower extremity tenderness, no upper extremity tenderness. No joint effusions, no DVT signs strong distal pulses no edema Fistula bleeding arterially when pressure removed Neurologic:  Normal speech and language. No gross focal neurologic deficits are appreciated.  Skin:  Skin is warm, dry and intact. No rash noted. Psychiatric: Mood and affect are normal. Speech and behavior are normal.  ____________________________________________   LABS (all labs ordered are listed, but only abnormal results are displayed)  Labs Reviewed  CBC WITH DIFFERENTIAL/PLATELET  PROTIME-INR  APTT    Pertinent labs  results that were available during my care of the patient were reviewed by me and considered in my medical decision making (see chart for details). ____________________________________________  EKG  I  personally interpreted any EKGs ordered by me or triage  ____________________________________________  RADIOLOGY  Pertinent labs & imaging results that were available during my care of the patient were reviewed by me and considered in my medical decision making (see chart for details). If possible, patient and/or family made aware of any abnormal findings. ____________________________________________    PROCEDURES  Procedure(s) performed: With patient consent, I tried a stepwise progression to control bleeding.  At L times, bleeding was controlled when I was not actually trying to do a procedure.  Initially we tried Gelfoam which did nothing that we tried quick clot which did nothing all of this with direct pressure, after that, I tried a stitch with tourniquet, and again had breakthrough bleeding.  I then tried Dermabond again using tourniquet, with no success.  Bleeding is controlled at this time with a clamp.  Patient not otherwise symptomatic.  Procedures  Critical Care performed: None  ____________________________________________   INITIAL IMPRESSION / ASSESSMENT AND PLAN / ED COURSE  Pertinent labs & imaging results that were available during my care of the patient were reviewed by me and considered in my medical decision making (see chart for details).  See above, total tourniquet time was less than 8 minutes.  Bleeding controlled at this time with a superficial clamp which does not cut off circulation but is sufficient to stop the bleeding, patient in no acute distress.  We are awaiting vascular surgeon.  I did discuss with Dr. Delana Meyer who will come evaluate bleeding fistula.   ____________________________________________   FINAL CLINICAL IMPRESSION(S) / ED DIAGNOSES  Final diagnoses:  None      This chart was dictated using voice recognition software.  Despite best efforts to proofread,  errors can occur which can change meaning.      Schuyler Amor, MD 10/05/17  [phone]

## 2017-10-05 NOTE — Op Note (Signed)
OPERATIVE NOTE   PROCEDURE: 1. Contrast injection left arm brachial axillary AV access 2. Percutaneous transluminal angioplasty and stent placement of AV graft left arm 2 locations both peripheral  PRE-OPERATIVE DIAGNOSIS: Complication of dialysis access                                                       End Stage Renal Disease  POST-OPERATIVE DIAGNOSIS: same as above   SURGEON: Katha Cabal, M.D.  ANESTHESIA: Conscious sedation was administered under my direct supervision by the interventional radiology RN. IV Versed plus fentanyl were utilized. Continuous ECG, pulse oximetry and blood pressure was monitored throughout the entire procedure.  Conscious sedation was for a total of 36.  ESTIMATED BLOOD LOSS: minimal  FINDING(S): 1. 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  Greater than 90% stenosis at the venous anastomosis  SPECIMEN(S):  None  CONTRAST: 35 cc  FLUOROSCOPY TIME: 2.0 minutes  INDICATIONS: SHELL BLANCHETTE is a 61 y.o. female who  presents with malfunctioning left brachial axillary AV access.  She presented to the emergency room with pulsatile bleeding from the midportion of her graft associated with a 1 cm centimeter ulceration.  The patient is brought emergently for angiography with intervention of the AV access.  The patient is aware the risks include but are not limited to: bleeding, infection, thrombosis of the cannulated access, and possible anaphylactic reaction to the contrast.  The patient acknowledges if the access can not be salvaged a tunneled catheter will be needed and will be placed during this procedure.  The patient is aware of the risks of the procedure and elects to proceed with the angiogram and intervention.  DESCRIPTION: After full informed written consent was obtained, the patient was brought back to the Special Procedure suite and placed supine position.  Appropriate cardiopulmonary monitors  were placed.  The left arm was prepped and draped in the standard fashion.  Appropriate timeout is called. The left brachial axillary AV graft was cannulated with a micropuncture needle.  Cannulation was performed with ultrasound guidance. Ultrasound was placed in a sterile sleeve, the AV access was interrogated and noted to be echolucent and compressible indicating patency. Image was recorded for the permanent record. The puncture is performed under continuous ultrasound visualization.   The microwire was advanced and the needle was exchanged for  a microsheath.  The J-wire was then advanced and a 7 Fr sheath inserted.  Hand injections were completed to image the access from the arterial anastomosis through the entire access.  The central venous structures were also imaged by hand injections later in the case.  Based on the images, there was aneurysmal formation in the midportion with obvious extravasation of contrast.  There is also a 90% stenosis at the venous anastomosis.  And 8 x 50 via bond was then deployed across the aneurysm and postdilated with an 8 mm Ultraverse balloon inflated to 16 atm for 15 seconds.  Follow-up imaging demonstrated complete exclusion of the aneurysm and there was no longer any evidence of extravasation.  Clinically the bleeding from the skin had now ceased.  Attention was then turned to the venous outflow.  It.  An 8 mm x 50 mm Viabahn was deployed across the stenoses and postdilated with an 8 mm Ultraverse balloon.  Follow-up imaging demonstrates complete resolution of the strictures and cessation of bleeding with rapid flow of contrast through the graft, the central venous anatomy is preserved.  A 4-0 Monocryl purse-string suture was sewn around the sheath.  The sheath was removed and light pressure was applied.  A sterile bandage was applied to the puncture site.    COMPLICATIONS: None  CONDITION: Carlynn Purl, M.D [facility] Office:  [phone]  10/05/2017 7:20 PM

## 2017-10-05 NOTE — H&P (Signed)
[facility] Admission History & Physical  MRN : [id number]  Deborah [name] is a 61 y.o. (1956-08-02) female who presents with chief complaint of  Chief Complaint  Patient presents with  . Dialysis shunt bleeding  .  History of Present Illness: I am asked to evaluate the patient by the ER physician Dr. Burlene Arnt. The patient was sent to the ER because they were unable to achieve adequate hemostasis after dialysis this morning.  Family also reports that there has been prolonged bleeding going on frequently after dialysis.  The patient estimates these problems have been going on for many weeks. The patient is unaware of any other change.  Patient denies pain or tenderness overlying the access.  There is no pain with dialysis.  The patient denies hand pain or finger pain consistent with steal syndrome.   Good she is on Eliquis 2  Current Facility-Administered Medications  Medication Dose Route Frequency Provider Last Rate Last Dose  . lidocaine (PF) (XYLOCAINE) 1 % injection           . gelatin adsorbable (GELFOAM/SURGIFOAM) sponge 12-7 mm 1 each  1 each Topical Once Schuyler Amor, MD       Current Outpatient Prescriptions  Medication Sig Dispense Refill  . acetaminophen (TYLENOL) 325 MG tablet Take 650 mg by mouth.    Marland Kitchen albuterol (PROVENTIL) (2.5 MG/3ML) 0.083% nebulizer solution Take 3 mLs (2.5 mg total) by nebulization 4 (four) times daily - after meals and at bedtime. 150 mL 1  . ALPRAZolam (XANAX) 0.25 MG tablet Take 1 tablet (0.25 mg total) by mouth 2 (two) times daily as needed for anxiety. 30 tablet 0  . amiodarone (PACERONE) 200 MG tablet Take 1 tablet (200 mg total) by mouth daily. 30 tablet 0  . apixaban (ELIQUIS) 2.5 MG TABS tablet Take 2.5 mg by mouth 2 (two) times daily.     Marland Kitchen atorvastatin (LIPITOR) 80 MG tablet Take 80 mg by mouth daily at 6 PM.     . B Complex-C-Folic Acid (VOL-CARE RX PO) Take 1 tablet by mouth daily.    . Blood Glucose  Monitoring Suppl (GLUCOCOM BLOOD GLUCOSE MONITOR) DEVI Use to check blood sugars 4-6 times per day Disp per insurance company kit ICD-10 E11.9    . clopidogrel (PLAVIX) 75 MG tablet Take 75 mg by mouth daily.    . furosemide (LASIX) 40 MG tablet Take 40 mg by mouth daily. ON mornings of dialysis    . gabapentin (NEURONTIN) 100 MG capsule Take 1 capsule (100 mg total) by mouth 2 (two) times daily. 60 capsule 1  . insulin glargine (LANTUS) 100 UNIT/ML injection Inject 14 Units into the skin at bedtime.    Marland Kitchen levofloxacin (LEVAQUIN) 500 MG tablet Take 1 tablet (500 mg total) by mouth every other day. 3 tablet 0  . Melatonin 3 MG TABS Take 3-6 mg by mouth at bedtime.     . midodrine (PROAMATINE) 5 MG tablet Take 5 mg by mouth 3 (three) times a week.    . NONFORMULARY OR COMPOUNDED ITEM See pharmacy note 120 each 2  . pantoprazole (PROTONIX) 40 MG tablet Take 1 tablet (40 mg total) by mouth 2 (two) times daily. 60 tablet 0  . polyethylene glycol (MIRALAX / GLYCOLAX) packet Take 17 g by mouth daily.    . QUEtiapine (SEROQUEL) 50 MG tablet Take 25 mg by mouth 3 (three) times daily as needed.     . sevelamer carbonate (RENVELA) 800 MG tablet  Take 1,600 mg by mouth 3 (three) times daily with meals.    . sodium chloride HYPERTONIC 3 % nebulizer solution Inhale 4 mL by nebulization every 6 (six) hours.      Past Medical History:  Diagnosis Date  . Diabetes mellitus   . Hyperlipidemia   . Hypertension   . MI, old   . Renal disorder    dialysls    Past Surgical History:  Procedure Laterality Date  . AV FISTULA PLACEMENT Left 10/02/2016   Procedure: INSERTION OF ARTERIOVENOUS (AV) GORE-TEX GRAFT ARM;  Surgeon: Waynetta Sandy, MD;  Location: [place];  Service: Vascular;  Laterality: Left;  . CARDIAC CATHETERIZATION N/A 08/15/2016   Procedure: Left Heart Cath and Coronary Angiography;  Surgeon: Yolonda Kida, MD;  Location: [place] [facility];  Service: Cardiovascular;  Laterality: N/A;   . CARDIAC CATHETERIZATION N/A 08/15/2016   Procedure: Coronary Stent Intervention;  Surgeon: Yolonda Kida, MD;  Location: [place] [facility];  Service: Cardiovascular;  Laterality: N/A;  . CARDIAC CATHETERIZATION N/A 08/18/2016   Procedure: Right Heart Cath;  Surgeon: Jolaine Artist, MD;  Location: [place] [facility];  Service: Cardiovascular;  Laterality: N/A;  . CARDIAC CATHETERIZATION N/A 08/18/2016   Procedure: IABP Insertion;  Surgeon: Jolaine Artist, MD;  Location: [place] [facility];  Service: Cardiovascular;  Laterality: N/A;  . CARDIAC CATHETERIZATION Right 08/23/2016   Procedure: CENTRAL LINE INSERTION RIGHT SUBCLAVIAN;  Surgeon: Ivin Poot, MD;  Location: [place];  Service: Open Heart Surgery;  Laterality: Right;  . ENDOVEIN HARVEST OF GREATER SAPHENOUS VEIN Left 08/23/2016   Procedure: ENDOVEIN HARVEST OF GREATER SAPHENOUS VEIN;  Surgeon: Ivin Poot, MD;  Location: [place];  Service: Open Heart Surgery;  Laterality: Left;  . INSERTION OF DIALYSIS CATHETER Bilateral 08/31/2016   Procedure: INSERTION OF DIALYSIS CATHETER LEFT INTERNAL JUGULAR VEIN & INSERTION OF TRIPLE LUMEN RIGHT INTERNAL JUGULAR VEIN;  Surgeon: Angelia Mould, MD;  Location: [place];  Service: Vascular;  Laterality: Bilateral;  . INTRAOPERATIVE TRANSESOPHAGEAL ECHOCARDIOGRAM N/A 08/23/2016   Procedure: INTRAOPERATIVE TRANSESOPHAGEAL ECHOCARDIOGRAM;  Surgeon: Ivin Poot, MD;  Location: [place];  Service: Open Heart Surgery;  Laterality: N/A;  . IR GENERIC HISTORICAL  11/13/2016   IR US GUIDE VASC ACCESS LEFT 11/13/2016 Corrie Mckusick, DO [facility]  . IR GENERIC HISTORICAL  11/13/2016   IR FLUORO GUIDE CV LINE LEFT 11/13/2016 Corrie Mckusick, DO [facility]  . IR GENERIC HISTORICAL  11/13/2016   IR GASTROSTOMY TUBE MOD SED 11/13/2016 Corrie Mckusick, DO [facility]  . MITRAL VALVE REPAIR N/A 08/23/2016   Procedure: MITRAL VALVE REPAIR (MVR) USING 25MM EDWARDS MAGNA EASE BIOPROSTHESIS MITRAL   VALVE;  Surgeon: Ivin Poot, MD;  Location: [place];  Service: Open Heart Surgery;  Laterality: N/A;  . tracheostomy reversal    . TRACHEOSTOMY TUBE PLACEMENT N/A 11/09/2016   Procedure: TRACHEOSTOMY;  Surgeon: Melissa Montane, MD;  Location: [place];  Service: ENT;  Laterality: N/A;  . VAGINAL DELIVERY     x 6    Social History Social History  Substance Use Topics  . Smoking status: Never Smoker  . Smokeless tobacco: Never Used  . Alcohol use No    Family History Family History  Problem Relation Age of Onset  . Hypertension Mother   . Diabetes Mother   . Breast cancer Sister     No family history of bleeding or clotting disorders, autoimmune disease or porphyria  No Known Allergies  REVIEW OF SYSTEMS (Negative unless checked)  Constitutional: [] Weight loss  [] Fever  [] Chills Cardiac: [] Chest pain   [] Chest pressure   [] Palpitations   [] Shortness of breath when laying flat   [] Shortness of breath at rest   [x] Shortness of breath with exertion. Vascular:  [] Pain in legs with walking   [] Pain in legs at rest   [] Pain in legs when laying flat   [] Claudication   [] Pain in feet when walking  [] Pain in feet at rest  [] Pain in feet when laying flat   [] History of DVT   [] Phlebitis   [] Swelling in legs   [] Varicose veins   [] Non-healing ulcers Pulmonary:   [] Uses home oxygen   [] Productive cough   [] Hemoptysis   [] Wheeze  [] COPD   [] Asthma Neurologic:  [] Dizziness  [] Blackouts   [] Seizures   [] History of stroke   [] History of TIA  [] Aphasia   [] Temporary blindness   [] Dysphagia   [] Weakness or numbness in arms   [] Weakness or numbness in legs Musculoskeletal:  [] Arthritis   [] Joint swelling   [] Joint pain   [] Low back pain Hematologic:  [] Easy bruising  [] Easy bleeding   [] Hypercoagulable state   [] Anemic  [] Hepatitis Gastrointestinal:  [] Blood in stool   [] Vomiting blood  [] Gastroesophageal reflux/heartburn   [] Difficulty swallowing. Genitourinary:  [x] Chronic kidney disease   [] Difficult  urination  [] Frequent urination  [] Burning with urination   [] Blood in urine Skin:  [] Rashes   [] Ulcers   [] Wounds Psychological:  [] History of anxiety   []  History of major depression.  Physical Examination  Vitals:   10/05/17 1550 10/05/17 1600 10/05/17 1630 10/05/17 1700  BP:  (!) 179/80 (!) 186/85 (!) 190/94  Pulse:  68 72 73  SpO2:  100% 100% 99%  Weight: 64 kg (141 lb)     Height: 5' 2"  (1.575 m)      Body mass index is 25.79 kg/m. Gen: WD/WN, NAD Head: [place]/AT, No temporalis wasting. Prominent temp pulse not noted. Ear/Nose/Throat: Hearing grossly intact, nares w/o erythema or drainage, oropharynx w/o Erythema/Exudate,  Eyes: Conjunctiva clear, sclera non-icteric Neck: Trachea midline.  No JVD.  Pulmonary: Permanent tracheostomy good air movement, respirations not labored, no use of accessory muscles.  Cardiac: RRR, normal S1, S2. Vascular:  left brachial axillary AV graft with pulsatile bleeding over an area approximately 1 cm in diameter Vessel Right Left  Radial Palpable Palpable  Ulnar Not Palpable Not Palpable  Brachial Palpable Palpable  Carotid Palpable, without bruit Palpable, without bruit  Gastrointestinal: soft, non-tender/non-distended. No guarding/reflex.  Musculoskeletal: M/S 5/5 throughout.  Extremities without ischemic changes.  No deformity or atrophy.  Neurologic: Sensation grossly intact in extremities.  Symmetrical.  Speech is fluent. Motor exam as listed above. Psychiatric: Judgment intact, Mood & affect appropriate for pt's clinical situation. Dermatologic: No rashes or ulcers noted.  No cellulitis or open wounds. Lymph : No Cervical, Axillary, or Inguinal lymphadenopathy.   CBC Lab Results  Component Value Date   WBC 8.3 10/05/2017   HGB 9.8 (L) 10/05/2017   HCT 29.4 (L) 10/05/2017   MCV 90.7 10/05/2017   PLT 245 10/05/2017    BMET    Component Value Date/Time   NA 134 (L) 10/02/2017 0437   K 3.6 10/02/2017 0437   CL 97 (L) 10/02/2017  0437   CO2 28 10/02/2017 0437   GLUCOSE 282 (H) 10/02/2017 0437   BUN 27 (H) 10/02/2017 0437   CREATININE 3.87 (H) 10/02/2017 0437   CREATININE 1.41 (H) 10/04/2012 1631  CALCIUM 8.5 (L) 10/02/2017 0437   GFRNONAA 12 (L) 10/02/2017 0437   GFRAA 14 (L) 10/02/2017 0437   Estimated Creatinine Clearance: 13.6 mL/min (A) (by C-G formula based on SCr of 3.87 mg/dL (H)).  COAG Lab Results  Component Value Date   INR 1.56 10/05/2017   INR 2.12 11/26/2016   INR 1.64 11/25/2016    Radiology Dg Chest 2 View  Result Date: 10/01/2017 CLINICAL DATA:  Shortness of breath.  Chronic renal failure EXAM: CHEST  2 VIEW COMPARISON:  September 09, 2017 FINDINGS: There is airspace consolidation in both lung bases, felt to represent multifocal pneumonia. Lungs elsewhere clear. There is cardiomegaly with mild pulmonary venous hypertension. Patient is status post mitral valve replacement. Tracheostomy is present with catheter tip 5.6 cm above the carina. No bone lesions. No pneumothorax. IMPRESSION: Bilateral lower lobe airspace consolidation consistent with multifocal pneumonia. Somewhat greater consolidation noted in the bases compared to most recent study. Cardiomegaly with pulmonary venous hypertension, findings indicative of a degree of pulmonary vascular congestion. Tracheostomy as described without pneumothorax. Followup PA and lateral chest radiographs recommended in 3-4 weeks following trial of antibiotic therapy to ensure resolution and exclude underlying malignancy. Electronically Signed   By: Lowella Grip III M.D.   On: 10/01/2017 16:34   Dg Chest 2 View  Result Date: 09/08/2017 CLINICAL DATA:  Cough EXAM: CHEST  2 VIEW COMPARISON:  Chest radiograph 07/24/2017 FINDINGS: Tracheostomy tube is in the level of the clavicles. There is unchanged cardiomegaly. There is central pulmonary vascular congestion and suspected early interstitial edema. No pleural effusion or pneumothorax. No focal airspace  consolidation. IMPRESSION: Cardiomegaly and pulmonary vascular congestion versus early pulmonary edema. Electronically Signed   By: Ulyses Jarred M.D.   On: 09/08/2017 01:40   Dg Chest Port 1 View  Result Date: 09/09/2017 CLINICAL DATA:  Shortness of breath. EXAM: PORTABLE CHEST 1 VIEW COMPARISON:  09/08/2017 FINDINGS: Tracheostomy appears unchanged. Chronic cardiomegaly with mitral or tricuspid valve replacement. Patchy atelectasis and or infiltrate in both lower lobes. Upper lungs are clear. Tiny amount of pleural fluid on the right. IMPRESSION: Patchy atelectasis and or infiltrate in both lower lobes. Small amount of pleural fluid on the right. Upper lobes clear. Electronically Signed   By: Nelson Chimes M.D.   On: 09/09/2017 11:49    Assessment/Plan 1.  Complication dialysis device with thrombosis AV access:  Patient's left arm brachial axillary dialysis access is bleeding significantly.  This is a life-threatening situation.  I have prepped the arm anesthetized with 1% lidocaine and put to 3-0 nylon horizontal mattress sutures into the soft tissue.  Although this slowed the bleeding this does not stop the bleeding.  Having accomplished this much I was better able to discern that the area of erosion over the graft is significantly larger than just a puncture site.  Given these findings I will move forward with emergent angiography.  The patient will undergo angiography and correction of any problems using interventional techniques with the hope of restoring function to the access.  The risks and benefits were described to the patient.  All questions were answered.  The patient agrees to proceed with angiography and intervention. Potassium will be drawn to ensure that it is an appropriate level prior to performing intervention.  I have also discussed with the family and the patient that if graft is actually visualized once the bleeding has been stopped that this is a serious problem that will require  removal of the graft or at least  a significant portion of the graft and revision.  2.  End-stage renal disease requiring hemodialysis:  Patient will continue dialysis therapy without further interruption if a successful intervention is not achieved then a tunneled catheter will be placed. Dialysis has already been arranged.  3.  Hypertension:  Patient will continue medical management; nephrology is following no changes in oral medications.  4. Diabetes mellitus:  Glucose will be monitored and oral medications been held this morning once the patient has undergone the patient's procedure po intake will be reinitiated and again Accu-Cheks will be used to assess the blood glucose level and treat as needed. The patient will be restarted on the patient's usual hypoglycemic regime.    5.  Coronary artery disease:  EKG will be monitored. Nitrates will be used if needed. The patient's oral cardiac medications will be continued.    Hortencia Pilar, MD  10/05/2017 6:23 PM

## 2017-10-05 NOTE — ED Notes (Signed)
Specials at bedside to take pt to specials. Bleeding is still not controlled at this time and pt and caregiver have been updated on plan of care. Pt will be taken to specials in attempt to stop bleeding. If bleeding stops pt will be brought back to ED to be discharged, if complications arise pt will be admitted to hospital. Reports called to Altha Harm, RN by this RN and all questions were answered. Pt has belongings in her possession at this time. Pt wearing jeans, tan shirt, grey sweatshirt that was cut, and shoes. Caregiver reports to this RN she has all other pt care equipment with her and will take it with her at this time.   Clamp remains on pts arm at this time. Pt alert and oriented and showing no symptoms of complications with blood loss. No dizziness or lightheadedness reported. Vitals remain stable at this time. Trach suction performed prior to pt leaving ED treatment room. Pt is no respiratory distress.

## 2017-10-06 LAB — CBC
HCT: 25 % — ABNORMAL LOW (ref 35.0–47.0)
Hemoglobin: 8.3 g/dL — ABNORMAL LOW (ref 12.0–16.0)
MCH: 30.1 pg (ref 26.0–34.0)
MCHC: 33.2 g/dL (ref 32.0–36.0)
MCV: 90.6 fL (ref 80.0–100.0)
PLATELETS: 206 10*3/uL (ref 150–440)
RBC: 2.76 MIL/uL — ABNORMAL LOW (ref 3.80–5.20)
RDW: 16.5 % — AB (ref 11.5–14.5)
WBC: 7.6 10*3/uL (ref 3.6–11.0)

## 2017-10-06 LAB — BASIC METABOLIC PANEL
Anion gap: 10 (ref 5–15)
BUN: 23 mg/dL — AB (ref 6–20)
CALCIUM: 8.8 mg/dL — AB (ref 8.9–10.3)
CO2: 30 mmol/L (ref 22–32)
CREATININE: 3.64 mg/dL — AB (ref 0.44–1.00)
Chloride: 97 mmol/L — ABNORMAL LOW (ref 101–111)
GFR calc Af Amer: 15 mL/min — ABNORMAL LOW (ref >60)
GFR, EST NON AFRICAN AMERICAN: 13 mL/min — AB (ref >60)
GLUCOSE: 238 mg/dL — AB (ref 65–99)
Potassium: 3.7 mmol/L (ref 3.5–5.1)
SODIUM: 137 mmol/L (ref 135–145)

## 2017-10-06 LAB — CULTURE, BLOOD (ROUTINE X 2)
CULTURE: NO GROWTH
Culture: NO GROWTH
SPECIAL REQUESTS: ADEQUATE

## 2017-10-06 LAB — GLUCOSE, CAPILLARY: GLUCOSE-CAPILLARY: 234 mg/dL — AB (ref 65–99)

## 2017-10-06 MED ORDER — ALBUTEROL SULFATE (2.5 MG/3ML) 0.083% IN NEBU: 2.5000 mg | INHALATION_SOLUTION | Freq: Two times a day (BID) | RESPIRATORY_TRACT | Status: DC

## 2017-10-06 MED ORDER — INSULIN ASPART 100 UNIT/ML ~~LOC~~ SOLN: 0.0000 [IU] | Freq: Three times a day (TID) | SUBCUTANEOUS | Status: DC

## 2017-10-06 NOTE — Discharge Summary (Signed)
Physician Discharge Summary  Patient ID: Deborah [name] MRN: [id number] DOB/AGE: 03-Jun-1956 61 y.o.  Admit date: 10/05/2017 Discharge date: 10/06/2017  Admission Diagnoses:  Discharge Diagnoses:  Active Problems:   Hemorrhage from arteriovenous dialysis graft [facility])   Discharged Condition: good  Hospital Course: Patient with Left brachial axillary fistula. Hemorrhage and aneurysm. Viabahn stent placed with good result. Remained HD stable, no further bleeding overnight.  Consults: None  Significant Diagnostic Studies: none  1. Treatments: surgery: Contrast injection left arm brachial axillary AV access Percutaneous transluminal angioplasty and stent placement of AV graft left arm 2 locations both peripheral  Discharge Exam: Blood pressure (!) 153/67, pulse 71, temperature 98.1 F (36.7 C), temperature source Oral, resp. rate 14, height 5' 2"  (1.575 m), weight 63.5 kg (140 lb), SpO2 100 %. General appearance: alert and no distress Cardio: regular rate and rhythm, S1, S2 normal, no murmur, click, rub or gallop Extremities: Left arm: warm Pulses: Left arm: +thrill/bruit in fistula, +radial pulse Skin: Skin over fistula intact  Disposition: 06-Home-Health Care Svc  Discharge Instructions    Activity as tolerated - No restrictions    Complete by:  As directed    Call MD for:  redness, tenderness, or signs of infection (pain, swelling, bleeding, redness, odor or green/yellow discharge around incision site)    Complete by:  As directed    Call MD for:  severe or increased pain, loss or decreased feeling  in affected limb(s)    Complete by:  As directed    Call MD for:  temperature >100.5    Complete by:  As directed    Discharge instructions    Complete by:  As directed    Please return for bleeding at fistula site or increased pain. May resume HD as scheduled   Remove dressing in 48 hours    Complete by:  As directed    Resume previous diet    Complete by:  As directed       Allergies as of 10/06/2017   No Known Allergies     Medication List    TAKE these medications   acetaminophen 325 MG tablet Commonly known as:  TYLENOL Take 650 mg by mouth.   albuterol (2.5 MG/3ML) 0.083% nebulizer solution Commonly known as:  PROVENTIL Take 3 mLs (2.5 mg total) by nebulization 4 (four) times daily - after meals and at bedtime.   ALPRAZolam 0.25 MG tablet Commonly known as:  XANAX Take 1 tablet (0.25 mg total) by mouth 2 (two) times daily as needed for anxiety.   amiodarone 200 MG tablet Commonly known as:  PACERONE Take 1 tablet (200 mg total) by mouth daily.   apixaban 2.5 MG Tabs tablet Commonly known as:  ELIQUIS Take 2.5 mg by mouth 2 (two) times daily.   atorvastatin 80 MG tablet Commonly known as:  LIPITOR Take 80 mg by mouth daily at 6 PM.   clopidogrel 75 MG tablet Commonly known as:  PLAVIX Take 75 mg by mouth daily.   furosemide 40 MG tablet Commonly known as:  LASIX Take 40 mg by mouth daily. ON mornings of dialysis   gabapentin 100 MG capsule Commonly known as:  NEURONTIN Take 1 capsule (100 mg total) by mouth 2 (two) times daily.   GLUCOCOM BLOOD GLUCOSE MONITOR Devi Use to check blood sugars 4-6 times per day Disp per insurance company kit ICD-10 E11.9   insulin glargine 100 UNIT/ML injection Commonly known as:  LANTUS Inject 14 Units into the skin at  bedtime.   levofloxacin 500 MG tablet Commonly known as:  LEVAQUIN Take 1 tablet (500 mg total) by mouth every other day.   Melatonin 3 MG Tabs Take 3-6 mg by mouth at bedtime.   midodrine 5 MG tablet Commonly known as:  PROAMATINE Take 5 mg by mouth 3 (three) times a week.   NONFORMULARY OR COMPOUNDED ITEM See pharmacy note   pantoprazole 40 MG tablet Commonly known as:  PROTONIX Take 1 tablet (40 mg total) by mouth 2 (two) times daily.   polyethylene glycol packet Commonly known as:  MIRALAX / GLYCOLAX Take 17 g by mouth daily.   QUEtiapine 50 MG  tablet Commonly known as:  SEROQUEL Take 25 mg by mouth 3 (three) times daily as needed.   sevelamer carbonate 800 MG tablet Commonly known as:  RENVELA Take 1,600 mg by mouth 3 (three) times daily with meals.   sodium chloride HYPERTONIC 3 % nebulizer solution Inhale 4 mL by nebulization every 6 (six) hours.   VOL-CARE RX PO Take 1 tablet by mouth daily.      Follow-up Information    Stegmayer, Janalyn Harder, PA-C Follow up in 1 week(s).   Specialty:  Physician Assistant Why:  no studies see next week to check the arm Contact information: [address] [place] [address] [phone]           Signed: Evaristo Bury 10/06/2017, 12:34 PM

## 2017-10-06 NOTE — Progress Notes (Addendum)
Deborah [name] to be D/C'd home per MD order.  Discussed prescriptions and follow up appointments with the patient. Prescriptions given to patient, medication list explained in detail. Pt verbalized understanding.  Allergies as of 10/06/2017   No Known Allergies     Medication List    TAKE these medications   acetaminophen 325 MG tablet Commonly known as:  TYLENOL Take 650 mg by mouth.   albuterol (2.5 MG/3ML) 0.083% nebulizer solution Commonly known as:  PROVENTIL Take 3 mLs (2.5 mg total) by nebulization 4 (four) times daily - after meals and at bedtime.   ALPRAZolam 0.25 MG tablet Commonly known as:  XANAX Take 1 tablet (0.25 mg total) by mouth 2 (two) times daily as needed for anxiety.   amiodarone 200 MG tablet Commonly known as:  PACERONE Take 1 tablet (200 mg total) by mouth daily.   apixaban 2.5 MG Tabs tablet Commonly known as:  ELIQUIS Take 2.5 mg by mouth 2 (two) times daily.   atorvastatin 80 MG tablet Commonly known as:  LIPITOR Take 80 mg by mouth daily at 6 PM.   clopidogrel 75 MG tablet Commonly known as:  PLAVIX Take 75 mg by mouth daily.   furosemide 40 MG tablet Commonly known as:  LASIX Take 40 mg by mouth daily. ON mornings of dialysis   gabapentin 100 MG capsule Commonly known as:  NEURONTIN Take 1 capsule (100 mg total) by mouth 2 (two) times daily.   GLUCOCOM BLOOD GLUCOSE MONITOR Devi Use to check blood sugars 4-6 times per day Disp per insurance company kit ICD-10 E11.9   insulin glargine 100 UNIT/ML injection Commonly known as:  LANTUS Inject 14 Units into the skin at bedtime.   levofloxacin 500 MG tablet Commonly known as:  LEVAQUIN Take 1 tablet (500 mg total) by mouth every other day.   Melatonin 3 MG Tabs Take 3-6 mg by mouth at bedtime.   midodrine 5 MG tablet Commonly known as:  PROAMATINE Take 5 mg by mouth 3 (three) times a week.   NONFORMULARY OR COMPOUNDED ITEM See pharmacy note   pantoprazole 40 MG tablet Commonly  known as:  PROTONIX Take 1 tablet (40 mg total) by mouth 2 (two) times daily.   polyethylene glycol packet Commonly known as:  MIRALAX / GLYCOLAX Take 17 g by mouth daily.   QUEtiapine 50 MG tablet Commonly known as:  SEROQUEL Take 25 mg by mouth 3 (three) times daily as needed.   sevelamer carbonate 800 MG tablet Commonly known as:  RENVELA Take 1,600 mg by mouth 3 (three) times daily with meals.   sodium chloride HYPERTONIC 3 % nebulizer solution Inhale 4 mL by nebulization every 6 (six) hours.   VOL-CARE RX PO Take 1 tablet by mouth daily.       Vitals:   10/05/17 2325 10/06/17 0359  BP:  (!) 153/67  Pulse:  71  Resp:    Temp:  98.1 F (36.7 C)  SpO2: 98% 100%    Skin clean, dry and intact without evidence of skin break down, no evidence of skin tears noted. IV catheter discontinued intact. Site without signs and symptoms of complications. Dressing and pressure applied. Pt denies pain at this time. No complaints noted.  An After Visit Summary was printed and given to the patient. Patient escorted via [place], and D/C home via private auto. Pt prefers to take pain meds and wait 1 hour for discharge  Deborah [name] A

## 2017-10-06 NOTE — Consult Note (Addendum)
I have seen the pt and reviewed and updated her meds at 9 am in morning. I will continue to follow the pt. Full consult note to follow.  Will cont to manage DM, HTN, CAD and ESRD on HD.  Pt is stable medically currently.

## 2017-10-06 NOTE — Progress Notes (Signed)
Patient came in with bleeding of her underlying dialysis access.  She underwent PTCA and stent placement. Her bleeding subsequent he resolved. She had dialysis yesterday. No need for dialysis at the moment.

## 2017-10-07 NOTE — Consult Note (Signed)
[facility] NAME: Deborah [name]    MR#:  [id number]  DATE OF BIRTH:  12-27-1955  DATE OF ADMISSION:  10/05/2017  PRIMARY CARE PHYSICIAN: System, Pcp Not In   REQUESTING/REFERRING PHYSICIAN: Schnier  CHIEF COMPLAINT:   Chief Complaint  Patient presents with  . Dialysis shunt bleeding    HISTORY OF PRESENT ILLNESS: Deborah [name]  is a 61 y.o. female with a known history of DM, Htn, CAD, HLD, ESRD on HD- had a bleed after finishing her HD, came to ER- urgent angiogram and dilation done to stop bleeding. Stable, no complains. Medical consult Is for medical management.  PAST MEDICAL HISTORY:   Past Medical History:  Diagnosis Date  . Diabetes mellitus   . Hyperlipidemia   . Hypertension   . MI, old   . Renal disorder    dialysls    PAST SURGICAL HISTORY: Past Surgical History:  Procedure Laterality Date  . AV FISTULA PLACEMENT Left 10/02/2016   Procedure: INSERTION OF ARTERIOVENOUS (AV) GORE-TEX GRAFT ARM;  Surgeon: Waynetta Sandy, MD;  Location: [facility];  Service: Vascular;  Laterality: Left;  . CARDIAC CATHETERIZATION N/A 08/15/2016   Procedure: Left Heart Cath and Coronary Angiography;  Surgeon: Yolonda Kida, MD;  Location: [facility];  Service: Cardiovascular;  Laterality: N/A;  . CARDIAC CATHETERIZATION N/A 08/15/2016   Procedure: Coronary Stent Intervention;  Surgeon: Yolonda Kida, MD;  Location: [facility];  Service: Cardiovascular;  Laterality: N/A;  . CARDIAC CATHETERIZATION N/A 08/18/2016   Procedure: Right Heart Cath;  Surgeon: Jolaine Artist, MD;  Location: [place] [facility];  Service: Cardiovascular;  Laterality: N/A;  . CARDIAC CATHETERIZATION N/A 08/18/2016   Procedure: IABP Insertion;  Surgeon: Jolaine Artist, MD;  Location: [facility];  Service: Cardiovascular;  Laterality: N/A;  . CARDIAC CATHETERIZATION Right 08/23/2016   Procedure: CENTRAL LINE INSERTION RIGHT  SUBCLAVIAN;  Surgeon: Ivin Poot, MD;  Location: [facility];  Service: Open Heart Surgery;  Laterality: Right;  . ENDOVEIN HARVEST OF GREATER SAPHENOUS VEIN Left 08/23/2016   Procedure: ENDOVEIN HARVEST OF GREATER SAPHENOUS VEIN;  Surgeon: Ivin Poot, MD;  Location: [facility];  Service: Open Heart Surgery;  Laterality: Left;  . INSERTION OF DIALYSIS CATHETER Bilateral 08/31/2016   Procedure: INSERTION OF DIALYSIS CATHETER LEFT INTERNAL JUGULAR VEIN & INSERTION OF TRIPLE LUMEN RIGHT INTERNAL JUGULAR VEIN;  Surgeon: Angelia Mould, MD;  Location: [facility];  Service: Vascular;  Laterality: Bilateral;  . INTRAOPERATIVE TRANSESOPHAGEAL ECHOCARDIOGRAM N/A 08/23/2016   Procedure: INTRAOPERATIVE TRANSESOPHAGEAL ECHOCARDIOGRAM;  Surgeon: Ivin Poot, MD;  Location: [facility];  Service: Open Heart Surgery;  Laterality: N/A;  . IR GENERIC HISTORICAL  11/13/2016   IR US GUIDE VASC ACCESS LEFT 11/13/2016 Corrie Mckusick, DO [facility]  . IR GENERIC HISTORICAL  11/13/2016   IR FLUORO GUIDE CV LINE LEFT 11/13/2016 Corrie Mckusick, DO [facility]  . IR GENERIC HISTORICAL  11/13/2016   IR GASTROSTOMY TUBE MOD SED 11/13/2016 Corrie Mckusick, DO [facility]  . MITRAL VALVE REPAIR N/A 08/23/2016   Procedure: MITRAL VALVE REPAIR (MVR) USING 25MM EDWARDS MAGNA EASE BIOPROSTHESIS MITRAL  VALVE;  Surgeon: Ivin Poot, MD;  Location: [facility];  Service: Open Heart Surgery;  Laterality: N/A;  . tracheostomy reversal    . TRACHEOSTOMY TUBE PLACEMENT N/A 11/09/2016   Procedure: TRACHEOSTOMY;  Surgeon: Melissa Montane, MD;  Location: [facility];  Service: ENT;  Laterality:  N/A;  . VAGINAL DELIVERY     x 6    SOCIAL HISTORY:  Social History  Substance Use Topics  . Smoking status: Never Smoker  . Smokeless tobacco: Never Used  . Alcohol use No    FAMILY HISTORY:  Family History  Problem Relation Age of Onset  . Hypertension Mother   . Diabetes Mother   . Breast cancer Sister     DRUG ALLERGIES: No Known  Allergies  REVIEW OF SYSTEMS:   CONSTITUTIONAL: No fever, fatigue or weakness.  EYES: No blurred or double vision.  EARS, NOSE, AND THROAT: No tinnitus or ear pain.  RESPIRATORY: No cough, shortness of breath, wheezing or hemoptysis.  CARDIOVASCULAR: No chest pain, orthopnea, edema.  GASTROINTESTINAL: No nausea, vomiting, diarrhea or abdominal pain.  GENITOURINARY: No dysuria, hematuria.  ENDOCRINE: No polyuria, nocturia,  HEMATOLOGY: No anemia, easy bruising or bleeding SKIN: No rash or lesion. MUSCULOSKELETAL: No joint pain or arthritis.   NEUROLOGIC: No tingling, numbness, weakness.  PSYCHIATRY: No anxiety or depression.   MEDICATIONS AT HOME:  Prior to Admission medications   Medication Sig Start Date End Date Taking? Authorizing Provider  acetaminophen (TYLENOL) 325 MG tablet Take 650 mg by mouth. 04/06/17  Yes [provider]  albuterol (PROVENTIL) (2.5 MG/3ML) 0.083% nebulizer solution Take 3 mLs (2.5 mg total) by nebulization 4 (four) times daily - after meals and at bedtime. 11/01/16  Yes Love, Ivan Anchors, PA-C  amiodarone (PACERONE) 200 MG tablet Take 1 tablet (200 mg total) by mouth daily. 11/01/16  Yes Love, Ivan Anchors, PA-C  apixaban (ELIQUIS) 2.5 MG TABS tablet Take 2.5 mg by mouth 2 (two) times daily.  06/22/17  Yes [provider]  atorvastatin (LIPITOR) 80 MG tablet Take 80 mg by mouth daily at 6 PM.    Yes [provider]  Blood Glucose Monitoring Suppl (GLUCOCOM BLOOD GLUCOSE MONITOR) DEVI Use to check blood sugars 4-6 times per day Disp per insurance company kit ICD-10 E11.9 07/18/17  Yes [provider]  clopidogrel (PLAVIX) 75 MG tablet Take 75 mg by mouth daily.   Yes [provider]  furosemide (LASIX) 40 MG tablet Take 40 mg by mouth daily. ON mornings of dialysis   Yes [provider]  gabapentin (NEURONTIN) 100 MG capsule Take 1 capsule (100 mg total) by mouth 2 (two) times daily. 08/13/17  Yes Edrick Kins, DPM   insulin glargine (LANTUS) 100 UNIT/ML injection Inject 14 Units into the skin at bedtime.   Yes [provider]  levofloxacin (LEVAQUIN) 500 MG tablet Take 1 tablet (500 mg total) by mouth every other day. 10/03/17 10/10/17 Yes Demetrios Loll, MD  Melatonin 3 MG TABS Take 3-6 mg by mouth at bedtime.  04/06/17  Yes [provider]  midodrine (PROAMATINE) 5 MG tablet Take 5 mg by mouth 3 (three) times a week.   Yes [provider]  pantoprazole (PROTONIX) 40 MG tablet Take 1 tablet (40 mg total) by mouth 2 (two) times daily. 11/01/16  Yes Love, Ivan Anchors, PA-C  QUEtiapine (SEROQUEL) 50 MG tablet Take 25 mg by mouth 3 (three) times daily as needed.  04/06/17  Yes [provider]  sevelamer carbonate (RENVELA) 800 MG tablet Take 1,600 mg by mouth 3 (three) times daily with meals.   Yes [provider]  sodium chloride HYPERTONIC 3 % nebulizer solution Inhale 4 mL by nebulization every 6 (six) hours. 06/22/17 06/22/18 Yes [provider]  ALPRAZolam Duanne Moron) 0.25 MG tablet Take  1 tablet (0.25 mg total) by mouth 2 (two) times daily as needed for anxiety. Patient not taking: Reported on 10/05/2017 11/01/16   Love, Ivan Anchors, PA-C  B Complex-C-Folic Acid (VOL-CARE RX PO) Take 1 tablet by mouth daily.    [provider]  NONFORMULARY OR COMPOUNDED ITEM See pharmacy note 07/20/17   Edrick Kins, DPM  polyethylene glycol [facility] / GLYCOLAX) packet Take 17 g by mouth daily.    [provider]      PHYSICAL EXAMINATION:   VITAL SIGNS: Blood pressure (!) 153/67, pulse 71, temperature 98.1 F (36.7 C), temperature source Oral, resp. rate 14, height 5' 2"  (1.575 m), weight 63.5 kg (140 lb), SpO2 100 %.  GENERAL:  61 y.o.-year-old patient lying in the bed with no acute distress.  EYES: Pupils equal, round, reactive to light and accommodation. No scleral icterus. Extraocular muscles intact.  HEENT: Head atraumatic, normocephalic. Oropharynx and  nasopharynx clear.  NECK:  Supple, no jugular venous distention. No thyroid enlargement, no tenderness.  LUNGS: Normal breath sounds bilaterally, no wheezing, rales,rhonchi or crepitation. No use of accessory muscles of respiration.  CARDIOVASCULAR: S1, S2 normal. No murmurs, rubs, or gallops.  ABDOMEN: Soft, nontender, nondistended. Bowel sounds present. No organomegaly or mass.  EXTREMITIES: No pedal edema, cyanosis, or clubbing. Left arm have fistula and dressing over it. NEUROLOGIC: Cranial nerves II through XII are intact. Muscle strength 5/5 in all extremities. Sensation intact. Gait not checked.  PSYCHIATRIC: The patient is alert and oriented x 3.  SKIN: No obvious rash, lesion, or ulcer.   LABORATORY PANEL:   CBC  Recent Labs Lab 10/01/17 1704 10/02/17 0437 10/03/17 0528 10/05/17 1616 10/06/17 0458  WBC 16.3* 18.0* 14.5* 8.3 7.6  HGB 9.8* 8.9* 8.7* 9.8* 8.3*  HCT 29.5* 27.7* 25.9* 29.4* 25.0*  PLT 261 249 221 245 206  MCV 88.6 89.7 90.1 90.7 90.6  MCH 29.3 28.9 30.2 30.3 30.1  MCHC 33.0 32.2 33.5 33.4 33.2  RDW 16.9* 17.6* 17.3* 17.3* 16.5*  LYMPHSABS 0.9*  --   --  1.4  --   MONOABS 1.7*  --   --  1.0*  --   EOSABS 0.1  --   --  0.3  --   BASOSABS 0.1  --   --  0.1  --    ------------------------------------------------------------------------------------------------------------------  Chemistries   Recent Labs Lab 10/01/17 1704 10/02/17 0437 10/06/17 0458  NA 136 134* 137  K 3.3* 3.6 3.7  CL 95* 97* 97*  CO2 29 28 30   GLUCOSE 124* 282* 238*  BUN 13 27* 23*  CREATININE 2.76* 3.87* 3.64*  CALCIUM 9.0 8.5* 8.8*   ------------------------------------------------------------------------------------------------------------------ estimated creatinine clearance is 14.4 mL/min (A) (by C-G formula based on SCr of 3.64 mg/dL (H)). ------------------------------------------------------------------------------------------------------------------ No results for  input(s): TSH, T4TOTAL, T3FREE, THYROIDAB in the last 72 hours.  Invalid input(s): FREET3   Coagulation profile  Recent Labs Lab 10/05/17 1616  INR 1.56   ------------------------------------------------------------------------------------------------------------------- No results for input(s): DDIMER in the last 72 hours. -------------------------------------------------------------------------------------------------------------------  Cardiac Enzymes No results for input(s): CKMB, TROPONINI, MYOGLOBIN in the last 168 hours.  Invalid input(s): CK ------------------------------------------------------------------------------------------------------------------ Invalid input(s): POCBNP  ---------------------------------------------------------------------------------------------------------------  Urinalysis    Component Value Date/Time   COLORURINE YELLOW (A) 09/09/2017 1056   APPEARANCEUR HAZY (A) 09/09/2017 1056   LABSPEC 1.013 09/09/2017 1056   PHURINE 7.0 09/09/2017 1056   GLUCOSEU NEGATIVE 09/09/2017 1056   HGBUR NEGATIVE 09/09/2017 [place] 09/09/2017 1056   KETONESUR NEGATIVE  09/09/2017 1056   PROTEINUR 100 (A) 09/09/2017 1056   NITRITE NEGATIVE 09/09/2017 1056   LEUKOCYTESUR MODERATE (A) 09/09/2017 1056     RADIOLOGY: No results found.  EKG: Orders placed or performed during the hospital encounter of 10/01/17  . EKG 12-Lead  . EKG 12-Lead    IMPRESSION AND PLAN:  * DM   Cont lantua, Added ISS>  * CAD    Cont Eliquis and Plavix,   * A fib    COnt amiodarone and Eliquis.  * ESRD on HD    Called Nephro consult, had a full HD yesterday.  * Bleed from fistula    Stopped now, manage per vascular.  All the records are reviewed and case discussed with ED provider. Management plans discussed with the patient, family and they are in agreement.  CODE STATUS: Full. Code Status History    Date Active Date Inactive Code Status  Order ID Comments User Context   10/05/2017  7:19 PM 10/06/2017  7:05 PM Full Code [id number]  Schnier, Dolores Lory, MD Inpatient   10/01/2017  7:42 PM 10/03/2017  7:33 PM Full Code [id number]  Bettey Costa, MD Inpatient   09/08/2017  4:19 AM 09/10/2017  8:37 PM Full Code [id number]  Harrie Foreman, MD Inpatient   11/14/2016  1:47 PM 11/26/2016  5:31 PM Full Code [id number]  Corrie Mckusick, DO Inpatient   11/02/2016  9:16 PM 11/14/2016  1:47 PM Full Code [id number]  Corey Harold, NP ED   10/11/2016  4:35 PM 11/01/2016 10:00 PM Full Code [id number]  Bary Leriche, PA-C Inpatient   10/11/2016  4:35 PM 10/11/2016  4:35 PM Full Code [id number]  Bary Leriche, PA-C Inpatient   08/18/2016  2:56 PM 08/31/2016  8:38 AM Full Code [id number]  Shirley Friar, PA-C Inpatient   08/17/2016  7:58 AM 08/18/2016 11:51 AM Full Code [id number]  Flora Lipps, MD Inpatient   08/15/2016  4:16 PM 08/15/2016  7:06 PM Full Code [id number]  Yolonda Kida, MD Inpatient       TOTAL TIME TAKING CARE OF THIS PATIENT: 40 minutes.    Vaughan Basta M.D on 10/07/2017   Between 7am to 6pm - Pager - [phone]  After 6pm go to [email or web address] - password [email or web address] [facility]  Office  [phone]  CC: Primary care physician; System, Pcp Not In   Note: This dictation was prepared with Dragon dictation along with smaller phrase technology. Any transcriptional errors that result from this process are unintentional.

## 2017-10-08 ENCOUNTER — Encounter: Payer: Self-pay | Admitting: Vascular Surgery

## 2017-10-11 DIAGNOSIS — J40 Bronchitis, not specified as acute or chronic: Secondary | ICD-10-CM | POA: Insufficient documentation

## 2017-10-16 ENCOUNTER — Other Ambulatory Visit: Payer: Self-pay

## 2017-10-16 DIAGNOSIS — E1140 Type 2 diabetes mellitus with diabetic neuropathy, unspecified: Secondary | ICD-10-CM | POA: Insufficient documentation

## 2017-10-16 MED ORDER — GABAPENTIN 100 MG PO CAPS: 100.0000 mg | ORAL_CAPSULE | Freq: Two times a day (BID) | ORAL | 3 refills | Status: DC

## 2017-10-16 NOTE — Telephone Encounter (Signed)
Pharmacy request for refill in Gabapentin    Per Dr. Amalia Hailey, ok to refill.   This has been sent to pharmacy

## 2017-10-26 ENCOUNTER — Ambulatory Visit: Payer: Medicaid Other | Admitting: Podiatry

## 2017-10-30 ENCOUNTER — Ambulatory Visit: Payer: Medicaid Other | Admitting: Podiatry

## 2017-10-30 ENCOUNTER — Encounter: Payer: Self-pay | Admitting: Podiatry

## 2017-10-30 DIAGNOSIS — M79676 Pain in unspecified toe(s): Secondary | ICD-10-CM

## 2017-10-30 DIAGNOSIS — B351 Tinea unguium: Secondary | ICD-10-CM

## 2017-10-30 DIAGNOSIS — E0842 Diabetes mellitus due to underlying condition with diabetic polyneuropathy: Secondary | ICD-10-CM

## 2017-10-30 MED ORDER — GABAPENTIN 300 MG PO CAPS: 300.0000 mg | ORAL_CAPSULE | Freq: Three times a day (TID) | ORAL | 6 refills | Status: DC

## 2017-11-01 NOTE — Progress Notes (Signed)
   Subjective: Patient with diabetes mellitus presents to the office today for chief complaint of painful toenails.. Patient states that the pain is ongoing and is affecting their ability to ambulate without pain. Patient presents today for further treatment and evaluation.  Objective:  Physical Exam General: Alert and oriented x3 in no acute distress  Dermatology: Hyperkeratotic, dystrophic nails noted 1-5 bilateral skin is warm, dry and supple bilateral lower extremities. Negative for open lesions or macerations.  Vascular: Palpable pedal pulses bilaterally. No edema or erythema noted. Capillary refill within normal limits.  Neurological: Epicritic and protective threshold diminished bilaterally.   Musculoskeletal Exam: Pain on palpation at the keratotic lesion noted. Range of motion within normal limits bilateral. Muscle strength 5/5 in all groups bilateral.  Assessment: #1 Diabetes mellitus w/ peripheral neuropathy #2  Pain due to onychomycosis of toenail   Plan of Care:  #1 Patient evaluated #2  Mechanical debridement of nails 1-5 bilateral was performed using a nail nipper without incident or bleeding. #3 Insurance would not cover Lyrica. Prescription for Gabapentin 300 mg three times daily provided to patient.  #5 Patient is to return to the clinic in 3 months.    Edrick Kins, DPM [facility]  Dr. Edrick Kins, [address]                                        [place], [place] [address]                Office [phone]  Fax [phone]

## 2018-05-07 ENCOUNTER — Ambulatory Visit: Payer: Medicaid Other | Admitting: Podiatry

## 2018-05-07 ENCOUNTER — Encounter: Payer: Self-pay | Admitting: Podiatry

## 2018-05-07 DIAGNOSIS — B351 Tinea unguium: Secondary | ICD-10-CM | POA: Diagnosis not present

## 2018-05-07 DIAGNOSIS — E0842 Diabetes mellitus due to underlying condition with diabetic polyneuropathy: Secondary | ICD-10-CM

## 2018-05-07 DIAGNOSIS — M79676 Pain in unspecified toe(s): Secondary | ICD-10-CM

## 2018-05-09 NOTE — Progress Notes (Signed)
   SUBJECTIVE Patient with a history of diabetes mellitus presents to office today complaining of elongated, thickened nails that cause pain while ambulating in shoes. She is unable to trim her own nails. Patient is here for further evaluation and treatment.   Past Medical History:  Diagnosis Date  . Diabetes mellitus   . Hyperlipidemia   . Hypertension   . MI, old   . Renal disorder    dialysls    OBJECTIVE General Patient is awake, alert, and oriented x 3 and in no acute distress. Derm Skin is dry and supple bilateral. Negative open lesions or macerations. Remaining integument unremarkable. Nails are tender, long, thickened and dystrophic with subungual debris, consistent with onychomycosis, 1-5 bilateral. No signs of infection noted. Vasc  DP and PT pedal pulses palpable bilaterally. Temperature gradient within normal limits.  Neuro Epicritic and protective threshold sensation diminished bilaterally.  Musculoskeletal Exam No symptomatic pedal deformities noted bilateral. Muscular strength within normal limits.  ASSESSMENT 1. Diabetes Mellitus w/ peripheral neuropathy 2. Onychomycosis of nail due to dermatophyte bilateral 3. Pain in foot bilateral  PLAN OF CARE 1. Patient evaluated today. 2. Instructed to maintain good pedal hygiene and foot care. Stressed importance of controlling blood sugar.  3. Mechanical debridement of nails 1-5 bilaterally performed using a nail nipper. Filed with dremel without incident.  4. Return to clinic in 3 mos.     Edrick Kins, DPM [facility]  Dr. Edrick Kins, [place]                                        [place], [place] [address]                Office [phone]  Fax [phone]

## 2018-11-12 ENCOUNTER — Ambulatory Visit: Payer: Medicaid Other | Admitting: Podiatry

## 2018-12-03 ENCOUNTER — Encounter: Payer: Self-pay | Admitting: Podiatry

## 2018-12-03 ENCOUNTER — Ambulatory Visit (INDEPENDENT_AMBULATORY_CARE_PROVIDER_SITE_OTHER): Payer: Medicare Other | Admitting: Podiatry

## 2018-12-03 DIAGNOSIS — M79676 Pain in unspecified toe(s): Secondary | ICD-10-CM

## 2018-12-03 DIAGNOSIS — B351 Tinea unguium: Secondary | ICD-10-CM | POA: Diagnosis not present

## 2018-12-03 DIAGNOSIS — E0842 Diabetes mellitus due to underlying condition with diabetic polyneuropathy: Secondary | ICD-10-CM

## 2018-12-06 NOTE — Progress Notes (Signed)
   SUBJECTIVE Patient with a history of diabetes mellitus presents to office today complaining of elongated, thickened nails that cause pain while ambulating in shoes. She is unable to trim her own nails. Patient is here for further evaluation and treatment.   Past Medical History:  Diagnosis Date  . Diabetes mellitus   . Hyperlipidemia   . Hypertension   . MI, old   . Renal disorder    dialysls    OBJECTIVE General Patient is awake, alert, and oriented x 3 and in no acute distress. Derm Skin is dry and supple bilateral. Negative open lesions or macerations. Remaining integument unremarkable. Nails are tender, long, thickened and dystrophic with subungual debris, consistent with onychomycosis, 1-5 bilateral. No signs of infection noted. Vasc  DP and PT pedal pulses palpable bilaterally. Temperature gradient within normal limits.  Neuro Epicritic and protective threshold sensation diminished bilaterally.  Musculoskeletal Exam No symptomatic pedal deformities noted bilateral. Muscular strength within normal limits.  ASSESSMENT 1. Diabetes Mellitus w/ peripheral neuropathy 2. Onychomycosis of nail due to dermatophyte bilateral 3. Pain in foot bilateral  PLAN OF CARE 1. Patient evaluated today. 2. Instructed to maintain good pedal hygiene and foot care. Stressed importance of controlling blood sugar.  3. Mechanical debridement of nails 1-5 bilaterally performed using a nail nipper. Filed with dremel without incident.  4. Return to clinic in 3 mos.     Edrick Kins, DPM [facility]  Dr. Edrick Kins, [place]                                        [place], [place] [address]                Office [phone]  Fax [phone]

## 2018-12-31 ENCOUNTER — Other Ambulatory Visit: Payer: Self-pay | Admitting: Podiatry

## 2019-01-02 NOTE — Telephone Encounter (Signed)
Okay to refill? 

## 2019-01-06 MED ORDER — GABAPENTIN 300 MG PO CAPS: 300.0000 mg | ORAL_CAPSULE | Freq: Three times a day (TID) | ORAL | 1 refills | Status: DC

## 2019-01-06 NOTE — Addendum Note (Signed)
Addended by: Graceann Congress D on: 01/06/2019 05:29 PM   Modules accepted: Orders

## 2019-06-03 ENCOUNTER — Ambulatory Visit: Payer: Medicare Other | Admitting: Podiatry

## 2019-09-18 ENCOUNTER — Other Ambulatory Visit: Payer: Self-pay | Admitting: Podiatry

## 2019-10-17 ENCOUNTER — Inpatient Hospital Stay
Admission: EM | Admit: 2019-10-17 | Discharge: 2019-10-24 | DRG: 291 | Disposition: A | Discharge status: Home / Self Care | Payer: Medicare Other | Attending: Internal Medicine | Admitting: Internal Medicine

## 2019-10-17 ENCOUNTER — Other Ambulatory Visit: Payer: Self-pay

## 2019-10-17 ENCOUNTER — Emergency Department: Payer: Medicare Other

## 2019-10-17 DIAGNOSIS — J9601 Acute respiratory failure with hypoxia: Secondary | ICD-10-CM | POA: Diagnosis present

## 2019-10-17 DIAGNOSIS — B961 Klebsiella pneumoniae [K. pneumoniae] as the cause of diseases classified elsewhere: Secondary | ICD-10-CM | POA: Diagnosis not present

## 2019-10-17 DIAGNOSIS — R0902 Hypoxemia: Secondary | ICD-10-CM

## 2019-10-17 DIAGNOSIS — Z7682 Awaiting organ transplant status: Secondary | ICD-10-CM

## 2019-10-17 DIAGNOSIS — Z952 Presence of prosthetic heart valve: Secondary | ICD-10-CM | POA: Diagnosis not present

## 2019-10-17 DIAGNOSIS — Z930 Tracheostomy status: Secondary | ICD-10-CM | POA: Diagnosis not present

## 2019-10-17 DIAGNOSIS — J810 Acute pulmonary edema: Secondary | ICD-10-CM | POA: Diagnosis not present

## 2019-10-17 DIAGNOSIS — J189 Pneumonia, unspecified organism: Secondary | ICD-10-CM

## 2019-10-17 DIAGNOSIS — I480 Paroxysmal atrial fibrillation: Secondary | ICD-10-CM | POA: Diagnosis present

## 2019-10-17 DIAGNOSIS — I509 Heart failure, unspecified: Secondary | ICD-10-CM

## 2019-10-17 DIAGNOSIS — E785 Hyperlipidemia, unspecified: Secondary | ICD-10-CM | POA: Diagnosis present

## 2019-10-17 DIAGNOSIS — I2510 Atherosclerotic heart disease of native coronary artery without angina pectoris: Secondary | ICD-10-CM | POA: Diagnosis present

## 2019-10-17 DIAGNOSIS — I4892 Unspecified atrial flutter: Secondary | ICD-10-CM | POA: Diagnosis present

## 2019-10-17 DIAGNOSIS — Z951 Presence of aortocoronary bypass graft: Secondary | ICD-10-CM

## 2019-10-17 DIAGNOSIS — Z20828 Contact with and (suspected) exposure to other viral communicable diseases: Secondary | ICD-10-CM | POA: Diagnosis present

## 2019-10-17 DIAGNOSIS — I161 Hypertensive emergency: Secondary | ICD-10-CM | POA: Diagnosis present

## 2019-10-17 DIAGNOSIS — N186 End stage renal disease: Secondary | ICD-10-CM | POA: Diagnosis present

## 2019-10-17 DIAGNOSIS — I132 Hypertensive heart and chronic kidney disease with heart failure and with stage 5 chronic kidney disease, or end stage renal disease: Secondary | ICD-10-CM | POA: Diagnosis present

## 2019-10-17 DIAGNOSIS — Z8249 Family history of ischemic heart disease and other diseases of the circulatory system: Secondary | ICD-10-CM

## 2019-10-17 DIAGNOSIS — D631 Anemia in chronic kidney disease: Secondary | ICD-10-CM | POA: Diagnosis present

## 2019-10-17 DIAGNOSIS — Z79899 Other long term (current) drug therapy: Secondary | ICD-10-CM

## 2019-10-17 DIAGNOSIS — J90 Pleural effusion, not elsewhere classified: Secondary | ICD-10-CM | POA: Diagnosis not present

## 2019-10-17 DIAGNOSIS — J150 Pneumonia due to Klebsiella pneumoniae: Secondary | ICD-10-CM | POA: Diagnosis present

## 2019-10-17 DIAGNOSIS — I361 Nonrheumatic tricuspid (valve) insufficiency: Secondary | ICD-10-CM | POA: Diagnosis not present

## 2019-10-17 DIAGNOSIS — I059 Rheumatic mitral valve disease, unspecified: Secondary | ICD-10-CM | POA: Diagnosis present

## 2019-10-17 DIAGNOSIS — Z992 Dependence on renal dialysis: Secondary | ICD-10-CM | POA: Diagnosis not present

## 2019-10-17 DIAGNOSIS — N2581 Secondary hyperparathyroidism of renal origin: Secondary | ICD-10-CM | POA: Diagnosis present

## 2019-10-17 DIAGNOSIS — Z1611 Resistance to penicillins: Secondary | ICD-10-CM | POA: Diagnosis present

## 2019-10-17 DIAGNOSIS — Z227 Latent tuberculosis: Secondary | ICD-10-CM | POA: Diagnosis not present

## 2019-10-17 DIAGNOSIS — I252 Old myocardial infarction: Secondary | ICD-10-CM | POA: Diagnosis not present

## 2019-10-17 DIAGNOSIS — Z794 Long term (current) use of insulin: Secondary | ICD-10-CM

## 2019-10-17 DIAGNOSIS — I5031 Acute diastolic (congestive) heart failure: Secondary | ICD-10-CM | POA: Diagnosis not present

## 2019-10-17 DIAGNOSIS — Z7901 Long term (current) use of anticoagulants: Secondary | ICD-10-CM

## 2019-10-17 DIAGNOSIS — K219 Gastro-esophageal reflux disease without esophagitis: Secondary | ICD-10-CM | POA: Diagnosis present

## 2019-10-17 DIAGNOSIS — Z8615 Personal history of latent tuberculosis infection: Secondary | ICD-10-CM

## 2019-10-17 DIAGNOSIS — Z833 Family history of diabetes mellitus: Secondary | ICD-10-CM

## 2019-10-17 DIAGNOSIS — Z803 Family history of malignant neoplasm of breast: Secondary | ICD-10-CM

## 2019-10-17 DIAGNOSIS — E118 Type 2 diabetes mellitus with unspecified complications: Secondary | ICD-10-CM | POA: Diagnosis not present

## 2019-10-17 DIAGNOSIS — I5033 Acute on chronic diastolic (congestive) heart failure: Secondary | ICD-10-CM | POA: Diagnosis present

## 2019-10-17 DIAGNOSIS — I10 Essential (primary) hypertension: Secondary | ICD-10-CM | POA: Diagnosis not present

## 2019-10-17 DIAGNOSIS — E1122 Type 2 diabetes mellitus with diabetic chronic kidney disease: Secondary | ICD-10-CM | POA: Diagnosis present

## 2019-10-17 DIAGNOSIS — I4891 Unspecified atrial fibrillation: Secondary | ICD-10-CM | POA: Diagnosis not present

## 2019-10-17 DIAGNOSIS — E1140 Type 2 diabetes mellitus with diabetic neuropathy, unspecified: Secondary | ICD-10-CM | POA: Diagnosis present

## 2019-10-17 DIAGNOSIS — T8579XA Infection and inflammatory reaction due to other internal prosthetic devices, implants and grafts, initial encounter: Secondary | ICD-10-CM | POA: Diagnosis not present

## 2019-10-17 DIAGNOSIS — Z9861 Coronary angioplasty status: Secondary | ICD-10-CM

## 2019-10-17 DIAGNOSIS — E1165 Type 2 diabetes mellitus with hyperglycemia: Secondary | ICD-10-CM | POA: Diagnosis present

## 2019-10-17 DIAGNOSIS — B9561 Methicillin susceptible Staphylococcus aureus infection as the cause of diseases classified elsewhere: Secondary | ICD-10-CM | POA: Diagnosis not present

## 2019-10-17 DIAGNOSIS — I371 Nonrheumatic pulmonary valve insufficiency: Secondary | ICD-10-CM | POA: Diagnosis not present

## 2019-10-17 LAB — GLUCOSE, CAPILLARY
Glucose-Capillary: 177 mg/dL — ABNORMAL HIGH (ref 70–99)
Glucose-Capillary: 101 mg/dL — ABNORMAL HIGH (ref 70–99)
Glucose-Capillary: 94 mg/dL (ref 70–99)
Glucose-Capillary: 148 mg/dL — ABNORMAL HIGH (ref 70–99)
Glucose-Capillary: 225 mg/dL — ABNORMAL HIGH (ref 70–99)

## 2019-10-17 LAB — TRIGLYCERIDES: Triglycerides: 83 mg/dL (ref <150)

## 2019-10-17 LAB — CBC WITH DIFFERENTIAL/PLATELET
Abs Immature Granulocytes: 0.05 10*3/uL (ref 0.00–0.07)
Basophils Absolute: 0.1 10*3/uL (ref 0.0–0.1)
Basophils Relative: 1 %
Eosinophils Absolute: 0.2 10*3/uL (ref 0.0–0.5)
Eosinophils Relative: 2 %
HCT: 28.4 % — ABNORMAL LOW (ref 36.0–46.0)
Hemoglobin: 9.4 g/dL — ABNORMAL LOW (ref 12.0–15.0)
Immature Granulocytes: 0 %
Lymphocytes Relative: 11 %
Lymphs Abs: 1.4 10*3/uL (ref 0.7–4.0)
MCH: 29.6 pg (ref 26.0–34.0)
MCHC: 33.1 g/dL (ref 30.0–36.0)
MCV: 89.3 fL (ref 80.0–100.0)
Monocytes Absolute: 1.1 10*3/uL — ABNORMAL HIGH (ref 0.1–1.0)
Monocytes Relative: 9 %
Neutro Abs: 9.7 10*3/uL — ABNORMAL HIGH (ref 1.7–7.7)
Neutrophils Relative %: 77 %
Platelets: 250 10*3/uL (ref 150–400)
RBC: 3.18 MIL/uL — ABNORMAL LOW (ref 3.87–5.11)
RDW: 15.2 % (ref 11.5–15.5)
WBC: 12.7 10*3/uL — ABNORMAL HIGH (ref 4.0–10.5)
nRBC: 0 % (ref 0.0–0.2)

## 2019-10-17 LAB — BASIC METABOLIC PANEL
Anion gap: 15 (ref 5–15)
BUN: 49 mg/dL — ABNORMAL HIGH (ref 8–23)
CO2: 27 mmol/L (ref 22–32)
Calcium: 8.9 mg/dL (ref 8.9–10.3)
Chloride: 98 mmol/L (ref 98–111)
Creatinine, Ser: 6.47 mg/dL — ABNORMAL HIGH (ref 0.44–1.00)
GFR calc Af Amer: 7 mL/min — ABNORMAL LOW (ref >60)
GFR calc non Af Amer: 6 mL/min — ABNORMAL LOW (ref >60)
Glucose, Bld: 166 mg/dL — ABNORMAL HIGH (ref 70–99)
Potassium: 3.8 mmol/L (ref 3.5–5.1)
Sodium: 140 mmol/L (ref 135–145)

## 2019-10-17 LAB — SARS CORONAVIRUS 2 (TAT 6-24 HRS): SARS Coronavirus 2: NEGATIVE

## 2019-10-17 LAB — FIBRIN DERIVATIVES D-DIMER (ARMC ONLY): Fibrin derivatives D-dimer (ARMC): 846.15 ng/mL (FEU) — ABNORMAL HIGH (ref 0.00–499.00)

## 2019-10-17 LAB — HEMOGLOBIN A1C
Hgb A1c MFr Bld: 7.4 % — ABNORMAL HIGH (ref 4.8–5.6)
Mean Plasma Glucose: 165.68 mg/dL

## 2019-10-17 LAB — FIBRINOGEN: Fibrinogen: 356 mg/dL (ref 210–475)

## 2019-10-17 LAB — BRAIN NATRIURETIC PEPTIDE: B Natriuretic Peptide: 1350 pg/mL — ABNORMAL HIGH (ref 0.0–100.0)

## 2019-10-17 LAB — TROPONIN I (HIGH SENSITIVITY)
Troponin I (High Sensitivity): 37 ng/L — ABNORMAL HIGH (ref <18)
Troponin I (High Sensitivity): 50 ng/L — ABNORMAL HIGH (ref <18)

## 2019-10-17 LAB — TSH: TSH: 3.105 u[IU]/mL (ref 0.350–4.500)

## 2019-10-17 LAB — C-REACTIVE PROTEIN: CRP: <0.8 mg/dL (ref <1.0)

## 2019-10-17 LAB — FERRITIN: Ferritin: 1293 ng/mL — ABNORMAL HIGH (ref 11–307)

## 2019-10-17 LAB — LACTATE DEHYDROGENASE: LDH: 280 U/L — ABNORMAL HIGH (ref 98–192)

## 2019-10-17 LAB — MAGNESIUM: Magnesium: 1.8 mg/dL (ref 1.7–2.4)

## 2019-10-17 LAB — MRSA PCR SCREENING: MRSA by PCR: NEGATIVE

## 2019-10-17 LAB — PROCALCITONIN: Procalcitonin: 0.23 ng/mL

## 2019-10-17 MED ORDER — SODIUM CHLORIDE 0.9% FLUSH
3.0000 mL | Freq: Two times a day (BID) | INTRAVENOUS | Status: DC
  Administered 2019-10-17 – 2019-10-24 (×14): 3 mL via INTRAVENOUS

## 2019-10-17 MED ORDER — ATORVASTATIN CALCIUM 80 MG PO TABS
80.0000 mg | ORAL_TABLET | Freq: Every day | ORAL | Status: DC
  Administered 2019-10-17 – 2019-10-23 (×6): 80 mg via ORAL
  Filled 2019-10-17: qty 4
  Filled 2019-10-17: qty 1
  Filled 2019-10-17 (×2): qty 4
  Filled 2019-10-17: qty 1
  Filled 2019-10-17: qty 4

## 2019-10-17 MED ORDER — LABETALOL HCL 5 MG/ML IV SOLN: 20.0000 mg | INTRAVENOUS | Status: DC | PRN

## 2019-10-17 MED ORDER — GABAPENTIN 100 MG PO CAPS
100.0000 mg | ORAL_CAPSULE | Freq: Two times a day (BID) | ORAL | Status: DC
  Administered 2019-10-17 – 2019-10-23 (×14): 100 mg via ORAL
  Filled 2019-10-17 (×16): qty 1

## 2019-10-17 MED ORDER — VOL-CARE RX 1 MG PO TABS: ORAL_TABLET | Freq: Every day | ORAL | Status: DC

## 2019-10-17 MED ORDER — ONDANSETRON HCL 4 MG/2ML IJ SOLN
4.0000 mg | Freq: Four times a day (QID) | INTRAMUSCULAR | Status: DC | PRN
  Administered 2019-10-17: 4 mg via INTRAVENOUS
  Filled 2019-10-17: qty 2

## 2019-10-17 MED ORDER — MIDODRINE HCL 5 MG PO TABS
5.0000 mg | ORAL_TABLET | ORAL | Status: DC
  Administered 2019-10-17 – 2019-10-20 (×2): 5 mg via ORAL
  Filled 2019-10-17 (×2): qty 1

## 2019-10-17 MED ORDER — SEVELAMER CARBONATE 800 MG PO TABS
1600.0000 mg | ORAL_TABLET | Freq: Three times a day (TID) | ORAL | Status: DC
  Administered 2019-10-17 – 2019-10-24 (×19): 1600 mg via ORAL
  Filled 2019-10-17 (×22): qty 2

## 2019-10-17 MED ORDER — AMIODARONE HCL 200 MG PO TABS
200.0000 mg | ORAL_TABLET | Freq: Every day | ORAL | Status: DC
  Administered 2019-10-17 – 2019-10-24 (×8): 200 mg via ORAL
  Filled 2019-10-17 (×8): qty 1

## 2019-10-17 MED ORDER — HYDROCOD POLST-CPM POLST ER 10-8 MG/5ML PO SUER: 5.0000 mL | Freq: Two times a day (BID) | ORAL | Status: DC | PRN

## 2019-10-17 MED ORDER — ALPRAZOLAM 0.5 MG PO TABS
0.2500 mg | ORAL_TABLET | Freq: Two times a day (BID) | ORAL | Status: DC | PRN
  Administered 2019-10-20 – 2019-10-22 (×2): 0.25 mg via ORAL
  Filled 2019-10-17 (×2): qty 1

## 2019-10-17 MED ORDER — ENOXAPARIN SODIUM 40 MG/0.4ML ~~LOC~~ SOLN
40.0000 mg | SUBCUTANEOUS | Status: DC
  Filled 2019-10-17: qty 0.4

## 2019-10-17 MED ORDER — INSULIN GLARGINE 100 UNIT/ML ~~LOC~~ SOLN
14.0000 [IU] | Freq: Every day | SUBCUTANEOUS | Status: DC
  Administered 2019-10-17 – 2019-10-23 (×7): 14 [IU] via SUBCUTANEOUS
  Filled 2019-10-17 (×8): qty 0.14

## 2019-10-17 MED ORDER — SODIUM CHLORIDE 0.9 % IV SOLN
500.0000 mg | INTRAVENOUS | Status: AC
  Administered 2019-10-17 – 2019-10-21 (×5): 500 mg via INTRAVENOUS
  Filled 2019-10-17 (×5): qty 500

## 2019-10-17 MED ORDER — VANCOMYCIN HCL 500 MG IV SOLR
500.0000 mg | Freq: Once | INTRAVENOUS | Status: AC
  Administered 2019-10-17: 500 mg via INTRAVENOUS
  Filled 2019-10-17: qty 500

## 2019-10-17 MED ORDER — VANCOMYCIN HCL IN DEXTROSE 1-5 GM/200ML-% IV SOLN
1000.0000 mg | Freq: Once | INTRAVENOUS | Status: AC
  Administered 2019-10-17: 1000 mg via INTRAVENOUS
  Filled 2019-10-17: qty 200

## 2019-10-17 MED ORDER — ALBUTEROL SULFATE (2.5 MG/3ML) 0.083% IN NEBU
2.5000 mg | INHALATION_SOLUTION | Freq: Three times a day (TID) | RESPIRATORY_TRACT | Status: DC
  Administered 2019-10-17 – 2019-10-19 (×11): 2.5 mg via RESPIRATORY_TRACT
  Filled 2019-10-17 (×11): qty 3

## 2019-10-17 MED ORDER — ASPIRIN EC 81 MG PO TBEC
81.0000 mg | DELAYED_RELEASE_TABLET | Freq: Every day | ORAL | Status: DC
  Administered 2019-10-17 – 2019-10-24 (×8): 81 mg via ORAL
  Filled 2019-10-17 (×8): qty 1

## 2019-10-17 MED ORDER — ACETAMINOPHEN 325 MG PO TABS
650.0000 mg | ORAL_TABLET | ORAL | Status: DC | PRN
  Administered 2019-10-17 – 2019-10-18 (×2): 650 mg via ORAL
  Filled 2019-10-17 (×2): qty 2

## 2019-10-17 MED ORDER — CARVEDILOL 25 MG PO TABS
25.0000 mg | ORAL_TABLET | Freq: Two times a day (BID) | ORAL | Status: DC
  Administered 2019-10-17 – 2019-10-24 (×13): 25 mg via ORAL
  Filled 2019-10-17: qty 2
  Filled 2019-10-17: qty 1
  Filled 2019-10-17: qty 2
  Filled 2019-10-17: qty 1
  Filled 2019-10-17: qty 2
  Filled 2019-10-17 (×7): qty 1
  Filled 2019-10-17 (×2): qty 2

## 2019-10-17 MED ORDER — EPOETIN ALFA 10000 UNIT/ML IJ SOLN
10000.0000 [IU] | INTRAMUSCULAR | Status: DC
  Administered 2019-10-17 – 2019-10-24 (×4): 10000 [IU] via INTRAVENOUS
  Filled 2019-10-17: qty 1

## 2019-10-17 MED ORDER — MELATONIN 5 MG PO TABS
5.0000 mg | ORAL_TABLET | Freq: Every day | ORAL | Status: DC
  Administered 2019-10-17 – 2019-10-23 (×7): 5 mg via ORAL
  Filled 2019-10-17 (×9): qty 1

## 2019-10-17 MED ORDER — PANTOPRAZOLE SODIUM 40 MG PO TBEC
40.0000 mg | DELAYED_RELEASE_TABLET | Freq: Two times a day (BID) | ORAL | Status: DC
  Administered 2019-10-17 – 2019-10-24 (×15): 40 mg via ORAL
  Filled 2019-10-17 (×15): qty 1

## 2019-10-17 MED ORDER — SODIUM CHLORIDE 0.9 % IV SOLN
2.0000 g | Freq: Once | INTRAVENOUS | Status: AC
  Administered 2019-10-17: 2 g via INTRAVENOUS
  Filled 2019-10-17: qty 2

## 2019-10-17 MED ORDER — GUAIFENESIN ER 600 MG PO TB12
600.0000 mg | ORAL_TABLET | Freq: Two times a day (BID) | ORAL | Status: DC
  Administered 2019-10-17 – 2019-10-23 (×12): 600 mg via ORAL
  Filled 2019-10-17 (×15): qty 1

## 2019-10-17 MED ORDER — HEPARIN SODIUM (PORCINE) 5000 UNIT/ML IJ SOLN
5000.0000 [IU] | Freq: Two times a day (BID) | INTRAMUSCULAR | Status: DC
  Administered 2019-10-17 – 2019-10-19 (×5): 5000 [IU] via SUBCUTANEOUS
  Filled 2019-10-17 (×5): qty 1

## 2019-10-17 MED ORDER — ZOLPIDEM TARTRATE 5 MG PO TABS
5.0000 mg | ORAL_TABLET | Freq: Every evening | ORAL | Status: DC | PRN
  Administered 2019-10-23: 5 mg via ORAL
  Filled 2019-10-17: qty 1

## 2019-10-17 MED ORDER — SODIUM CHLORIDE 0.9 % IV SOLN
250.0000 mL | INTRAVENOUS | Status: DC | PRN
  Administered 2019-10-18 – 2019-10-22 (×2): 250 mL via INTRAVENOUS

## 2019-10-17 MED ORDER — GABAPENTIN 300 MG PO CAPS: 300.0000 mg | ORAL_CAPSULE | Freq: Three times a day (TID) | ORAL | Status: DC

## 2019-10-17 MED ORDER — FUROSEMIDE 10 MG/ML IJ SOLN
40.0000 mg | Freq: Two times a day (BID) | INTRAMUSCULAR | Status: DC
  Administered 2019-10-17 – 2019-10-24 (×15): 40 mg via INTRAVENOUS
  Filled 2019-10-17 (×15): qty 4

## 2019-10-17 MED ORDER — CHLORHEXIDINE GLUCONATE CLOTH 2 % EX PADS
6.0000 | MEDICATED_PAD | Freq: Every day | CUTANEOUS | Status: DC
  Administered 2019-10-22 – 2019-10-24 (×2): 6 via TOPICAL
  Filled 2019-10-17: qty 6

## 2019-10-17 MED ORDER — SODIUM CHLORIDE 0.9% FLUSH: 3.0000 mL | INTRAVENOUS | Status: DC | PRN

## 2019-10-17 MED ORDER — POLYETHYLENE GLYCOL 3350 17 G PO PACK
17.0000 g | PACK | Freq: Every day | ORAL | Status: DC
  Filled 2019-10-17 (×7): qty 1

## 2019-10-17 MED ORDER — QUETIAPINE FUMARATE 25 MG PO TABS: 25.0000 mg | ORAL_TABLET | Freq: Three times a day (TID) | ORAL | Status: DC | PRN

## 2019-10-17 MED ORDER — SODIUM CHLORIDE 0.9 % IV SOLN
2.0000 g | INTRAVENOUS | Status: AC
  Administered 2019-10-17 – 2019-10-21 (×5): 2 g via INTRAVENOUS
  Filled 2019-10-17 (×4): qty 2
  Filled 2019-10-17: qty 20

## 2019-10-17 MED ORDER — INSULIN ASPART 100 UNIT/ML ~~LOC~~ SOLN
0.0000 [IU] | Freq: Three times a day (TID) | SUBCUTANEOUS | Status: DC
  Administered 2019-10-17: 1 [IU] via SUBCUTANEOUS
  Administered 2019-10-17 – 2019-10-18 (×2): 2 [IU] via SUBCUTANEOUS
  Administered 2019-10-18 (×2): 3 [IU] via SUBCUTANEOUS
  Administered 2019-10-19 – 2019-10-20 (×4): 2 [IU] via SUBCUTANEOUS
  Administered 2019-10-20: 1 [IU] via SUBCUTANEOUS
  Administered 2019-10-21: 2 [IU] via SUBCUTANEOUS
  Administered 2019-10-21: 1 [IU] via SUBCUTANEOUS
  Administered 2019-10-21: 3 [IU] via SUBCUTANEOUS
  Administered 2019-10-22 (×2): 2 [IU] via SUBCUTANEOUS
  Administered 2019-10-23: 1 [IU] via SUBCUTANEOUS
  Administered 2019-10-23: 3 [IU] via SUBCUTANEOUS
  Administered 2019-10-23: 1 [IU] via SUBCUTANEOUS
  Filled 2019-10-17 (×18): qty 1

## 2019-10-17 MED ORDER — NITROGLYCERIN IN D5W 200-5 MCG/ML-% IV SOLN
30.0000 ug/min | INTRAVENOUS | Status: DC
  Administered 2019-10-17: 30 ug/min via INTRAVENOUS
  Filled 2019-10-17: qty 250

## 2019-10-17 NOTE — ED Notes (Signed)
Pt up to use bathroom 

## 2019-10-17 NOTE — ED Notes (Signed)
1 set of blood cultures sent to lab from dialysis tubing. Unsuccessful attempt to obtain other set of blood cultures. Lab called and they stated that they would send someone to obtain second set of blood cultures.

## 2019-10-17 NOTE — Progress Notes (Signed)
Pre HD Tx Note  I arrived to ED 17 to initiate and deliver HD Tx. Pt is on RA SPO2 97 breathing via Tracheotomy. Pt Temp is 100.6 Primary RN and Nephrologist are notified. AVG is patent and WDL Pt will run for 3.5hrs on 3K2.5Ca. P t is currently on continuous  10/17/19 1145  Hand-Off documentation  Report given to (Full Name) Newt Minion RN   Report received from (Full Name) Weston Brass RN  Vital Signs  Temp (!) 100.6 F (38.1 C)  Temp Source Oral  Pulse Rate 85  Pulse Rate Source Monitor  Resp 20  BP (!) 144/127  BP Location Right Leg  BP Method Automatic  Patient Position (if appropriate) Lying  Oxygen Therapy  SpO2 95 %  O2 Device Tracheostomy Collar  Pain Assessment  Pain Scale 0-10  Pain Score 0  Dialysis Weight  Weight 65.7 kg  Type of Weight Pre-Dialysis  Time-Out for Hemodialysis  What Procedure? HD   Pt Identifiers(min of two) First/Last Name;MRN/Account#  Correct Site? Yes  Correct Side? Yes  Correct Procedure? Yes  Rad Studies Available? Yes  Safety Precautions Reviewed? Yes  Engineer, civil (consulting) Number [place] Number  (ICU 17)  UF/Alarm Test Passed  Conductivity: Meter 14  Conductivity: Machine  13.6  pH 7.4  Reverse Osmosis  (RO 4)  Normal Saline Lot Number [id number]  Dialyzer Lot Number [id number]  Disposable Set Lot Number [id number]  Machine Temperature 98.8 F (37.1 C)  Musician and Audible Yes  Blood Lines Intact and Secured Yes  Pre Treatment Patient Checks  Vascular access used during treatment Graft  Patient is receiving dialysis in a chair  (in bed)  Hepatitis B Surface Antigen Results Negative  Date Hepatitis B Surface Antigen Drawn 10/08/19  Hepatitis B Surface Antibody  (<10)  Date Hepatitis B Surface Antibody Drawn 07/09/19  Hemodialysis Consent Verified Yes  Hemodialysis Standing Orders Initiated Yes  ECG (Telemetry) Monitor On Yes  Prime Ordered Normal Saline  Length of  DialysisTreatment -hour(s) 3.5 Hour(s)   Dialysis Treatment Comments  (Na140)  Dialyzer Elisio 17H NR  Dialysate 3K;2.5 Ca  Dialysis Anticoagulant None  Dialysate Flow Ordered 600  Blood Flow Rate Ordered 400 mL/min  Ultrafiltration Goal 3.5 Liters  Dialysis Blood Pressure Support Ordered Albumin  Education / Care Plan  Dialysis Education Provided Yes  Documented Education in Care Plan Yes  Fistula / Graft Left Upper arm  Placement Date: 10/02/16   Placed prior to admission: No  Orientation: Left  Access Location: Upper arm  Fistula / Graft Assessment Present;Thrill;Bruit  Status Accessed  Needle Size 15  Drainage Description None   Nitroglycerin 31mcg/min and Zithromax 500mg  in 250 ml. Will keep monitoring

## 2019-10-17 NOTE — ED Notes (Signed)
Sent message to pharmacy for lovenox

## 2019-10-17 NOTE — Consult Note (Signed)
Cardiology Consultation Note    Patient ID: NEGAR SIELER, MRN: [id number], DOB/AGE: May 31, 1956 63 y.o. Admit date: 10/17/2019   Date of Consult: 10/17/2019 Primary Physician: System, Pcp Not In Primary Cardiologist: [facility]  Chief Complaint: sob Reason for Consultation: chf Requesting MD: Dr. Nevada Crane  HPI: Deborah [name] is a 63 y.o. 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  She is not on oxygen at home.  She has had a dry cough with orthopnea PND.  She states she has not missed any of her dialysis events.  She was significantly hypertensive in the emergency room with systolic pressures approaching 200.  She had a temperature of 99.2 with white blood cell count of 12.7 with neutrophilia.  She was given antibiotics as well as placed on a nitroglycerin drip 10 manage her blood pressure.  She is currently dialyzing in the ER.  Chest x-ray showed mild pulmonary vascular congestion with bilateral small pleural effusions.  Serum high-sensitivity troponin was 37.  BNP was 1350 up from 868 2 years ago.  EKG revealed sinus rhythm with nonspecific ST-T wave changes.  She is on amlodipine at 10 mg daily, atorvastatin at 80 mg daily, carvedilol 25 mg twice daily, Eliquis 2.5 mg twice daily, and furosemide.  Past Medical History:  Diagnosis Date  . Diabetes mellitus   . Hyperlipidemia   . Hypertension   . MI, old   . Renal disorder    dialysls      Surgical History:  Past Surgical History:  Procedure Laterality Date  . A/V FISTULAGRAM N/A 10/05/2017   Procedure: A/V Fistulagram;  Surgeon: Katha Cabal, MD;  Location: [place]  CV [facility];  Service: Cardiovascular;  Laterality: N/A;  . A/V SHUNTOGRAM N/A 10/05/2017   Procedure: A/V SHUNTOGRAM;  Surgeon: Katha Cabal, MD;  Location: [place] [facility];  Service: Cardiovascular;  Laterality: N/A;  . AV FISTULA PLACEMENT Left 10/02/2016   Procedure: INSERTION OF ARTERIOVENOUS (AV) GORE-TEX GRAFT ARM;  Surgeon: Waynetta Sandy, MD;  Location: [place];  Service: Vascular;  Laterality: Left;  . CARDIAC CATHETERIZATION N/A 08/15/2016   Procedure: Left Heart Cath and Coronary Angiography;  Surgeon: Yolonda Kida, MD;  Location: [place] [facility];  Service: Cardiovascular;  Laterality: N/A;  . CARDIAC CATHETERIZATION N/A 08/15/2016   Procedure: Coronary Stent Intervention;  Surgeon: Yolonda Kida, MD;  Location: [place] [facility];  Service: Cardiovascular;  Laterality: N/A;  . CARDIAC CATHETERIZATION N/A 08/18/2016   Procedure: Right Heart Cath;  Surgeon: Jolaine Artist, MD;  Location: [place] [facility];  Service: Cardiovascular;  Laterality: N/A;  . CARDIAC CATHETERIZATION N/A 08/18/2016   Procedure: IABP Insertion;  Surgeon: Jolaine Artist, MD;  Location: [place] CV [facility];  Service: Cardiovascular;  Laterality: N/A;  . CARDIAC CATHETERIZATION Right 08/23/2016   Procedure: CENTRAL LINE INSERTION RIGHT SUBCLAVIAN;  Surgeon: Ivin Poot, MD;  Location: [place];  Service: Open Heart Surgery;  Laterality: Right;  . ENDOVEIN HARVEST OF GREATER SAPHENOUS VEIN Left 08/23/2016   Procedure: ENDOVEIN HARVEST OF GREATER SAPHENOUS VEIN;  Surgeon: Ivin Poot, MD;  Location: [place];  Service: Open Heart Surgery;  Laterality: Left;  . INSERTION OF DIALYSIS CATHETER Bilateral 08/31/2016   Procedure: INSERTION OF DIALYSIS CATHETER LEFT INTERNAL JUGULAR VEIN & INSERTION OF TRIPLE LUMEN RIGHT INTERNAL JUGULAR VEIN;  Surgeon: Angelia Mould, MD;  Location: [place];  Service: Vascular;  Laterality: Bilateral;  . INTRAOPERATIVE TRANSESOPHAGEAL ECHOCARDIOGRAM N/A  08/23/2016   Procedure: INTRAOPERATIVE TRANSESOPHAGEAL ECHOCARDIOGRAM;  Surgeon: Ivin Poot, MD;  Location: [place];  Service: Open Heart Surgery;  Laterality: N/A;  . IR GENERIC HISTORICAL  11/13/2016   IR US GUIDE VASC ACCESS LEFT 11/13/2016 Corrie Mckusick, DO [facility]  . IR GENERIC HISTORICAL  11/13/2016   IR FLUORO GUIDE CV LINE LEFT 11/13/2016 Corrie Mckusick, DO [facility]  . IR GENERIC HISTORICAL  11/13/2016   IR GASTROSTOMY TUBE MOD SED 11/13/2016 Corrie Mckusick, DO [facility]  . MITRAL VALVE REPAIR N/A 08/23/2016   Procedure: MITRAL VALVE REPAIR (MVR) USING 25MM EDWARDS MAGNA EASE BIOPROSTHESIS MITRAL  VALVE;  Surgeon: Ivin Poot, MD;  Location: [place];  Service: Open Heart Surgery;  Laterality: N/A;  . tracheostomy reversal    . TRACHEOSTOMY TUBE PLACEMENT N/A 11/09/2016   Procedure: TRACHEOSTOMY;  Surgeon: Melissa Montane, MD;  Location: [place];  Service: ENT;  Laterality: N/A;  . VAGINAL DELIVERY     x 6     Home Meds: Prior to Admission medications   Medication Sig Start Date End Date Taking? Authorizing Provider  acetaminophen (TYLENOL) 325 MG tablet Take 650 mg by mouth. 04/06/17   [provider]  albuterol (PROVENTIL) (2.5 MG/3ML) 0.083% nebulizer solution Take 3 mLs (2.5 mg total) by nebulization 4 (four) times daily - after meals and at bedtime. 11/01/16   Love, Ivan Anchors, PA-C  ALPRAZolam (XANAX) 0.25 MG tablet Take 1 tablet (0.25 mg total) by mouth 2 (two) times daily as needed for anxiety. 11/01/16   Love, Ivan Anchors, PA-C  amiodarone (PACERONE) 200 MG tablet Take 1 tablet (200 mg total) by mouth daily. 11/01/16   Love, Ivan Anchors, PA-C  apixaban (ELIQUIS) 2.5 MG TABS tablet Take 2.5 mg by mouth 2 (two) times daily.  06/22/17   [provider]  atorvastatin (LIPITOR) 80 MG tablet Take 80 mg by mouth daily at 6 PM.     [provider]  B Complex-C-Folic Acid (VOL-CARE RX PO) Take 1 tablet by mouth daily.    [provider]  Blood Glucose  Monitoring Suppl (GLUCOCOM BLOOD GLUCOSE MONITOR) DEVI Use to check blood sugars 4-6 times per day Disp per insurance company kit ICD-10 E11.9 07/18/17   [provider]  carvedilol (COREG) 25 MG tablet Take 25 mg by mouth 2 (two) times daily. 04/22/18   [provider]  clopidogrel (PLAVIX) 75 MG tablet Take 75 mg by mouth daily.    [provider]  furosemide (LASIX) 40 MG tablet Take 40 mg by mouth daily. ON mornings of dialysis    [provider]  gabapentin (NEURONTIN) 100 MG capsule Take 1 capsule (100 mg total) 2 (two) times daily by mouth. 10/16/17   Edrick Kins, DPM  gabapentin (NEURONTIN) 300 MG capsule TAKE 1 CAPSULE BY MOUTH THREE TIMES DAILY 09/18/19   Edrick Kins, DPM  insulin glargine (LANTUS) 100 UNIT/ML injection Inject 14 Units into the skin at bedtime.    [provider]  Melatonin 3 MG TABS Take 3-6 mg by mouth at bedtime.  04/06/17   [provider]  midodrine (PROAMATINE) 5 MG tablet Take 5 mg by mouth  3 (three) times a week.    [provider]  NONFORMULARY OR COMPOUNDED ITEM See pharmacy note 07/20/17   Edrick Kins, DPM  pantoprazole (PROTONIX) 40 MG tablet Take 1 tablet (40 mg total) by mouth 2 (two) times daily. 11/01/16   Love, Ivan Anchors, PA-C  polyethylene glycol (MIRALAX / GLYCOLAX) packet Take 17 g by mouth daily.    [provider]  QUEtiapine (SEROQUEL) 50 MG tablet Take 25 mg by mouth 3 (three) times daily as needed.  04/06/17   [provider]  sevelamer carbonate (RENVELA) 800 MG tablet Take 1,600 mg by mouth 3 (three) times daily with meals.    [provider]  traZODone (DESYREL) 50 MG tablet  04/04/18   [provider]    Inpatient Medications:  . albuterol  2.5 mg Nebulization TID PC & HS  . amiodarone  200 mg Oral Daily  . aspirin EC  81 mg Oral Daily  . atorvastatin  80 mg Oral q1800  . carvedilol  25 mg Oral BID  . Chlorhexidine Gluconate Cloth  6 each  Topical Q0600  . epoetin (EPOGEN/PROCRIT) injection  10,000 Units Intravenous Q M,W,F-HD  . furosemide  40 mg Intravenous Q12H  . gabapentin  100 mg Oral BID  . guaiFENesin  600 mg Oral BID  . heparin injection (subcutaneous)  5,000 Units Subcutaneous Q12H  . insulin aspart  0-9 Units Subcutaneous TID WC  . insulin glargine  14 Units Subcutaneous QHS  . Melatonin  3-6 mg Oral QHS  . midodrine  5 mg Oral Once per day on Mon Wed Fri  . pantoprazole  40 mg Oral BID  . polyethylene glycol  17 g Oral Daily  . sevelamer carbonate  1,600 mg Oral TID WC  . sodium chloride flush  3 mL Intravenous Q12H   . sodium chloride    . azithromycin Stopped (10/17/19 1253)  . cefTRIAXone (ROCEPHIN)  IV    . nitroGLYCERIN 30 mcg/min (10/17/19 0920)    Allergies: No Known Allergies  Social History   Socioeconomic History  . Marital status: Widowed    Spouse name: Not on file  . Number of children: 6  . Years of education: Not on file  . Highest education level: Not on file  Occupational History  . Not on file  Social Needs  . Financial resource strain: Not on file  . Food insecurity    Worry: Not on file    Inability: Not on file  . Transportation needs    Medical: Not on file    Non-medical: Not on file  Tobacco Use  . Smoking status: Never Smoker  . Smokeless tobacco: Never Used  Substance and Sexual Activity  . Alcohol use: No  . Drug use: No  . Sexual activity: Not on file  Lifestyle  . Physical activity    Days per week: Not on file    Minutes per session: Not on file  . Stress: Not on file  Relationships  . Social Herbalist on phone: Not on file    Gets together: Not on file    Attends religious service: Not on file    Active member of club or organization: Not on file    Attends meetings of clubs or organizations: Not on file    Relationship status: Not on file  . Intimate partner violence    Fear of current or ex partner: Not on file    Emotionally abused:  Not on file    Physically abused: Not on file    Forced sexual activity: Not on file  Other Topics Concern  . Not on file  Social History Narrative   Lives in [place]. Has 2 daughtesr, 3 grandkids.     Family History  Problem Relation Age of Onset  . Hypertension Mother   . Diabetes Mother   . Breast cancer Sister      Review of Systems: A 12-system review of systems was performed and is negative except as noted in the HPI.  Labs: No results for input(s): CKTOTAL, CKMB, TROPONINI in the last 72 hours. Lab Results  Component Value Date   WBC 12.7 (H) 10/17/2019   HGB 9.4 (L) 10/17/2019   HCT 28.4 (L) 10/17/2019   MCV 89.3 10/17/2019   PLT 250 10/17/2019    Recent Labs  Lab 10/17/19 0445  NA 140  K 3.8  CL 98  CO2 27  BUN 49*  CREATININE 6.47*  CALCIUM 8.9  GLUCOSE 166*   Lab Results  Component Value Date   CHOL 130 11/25/2016   HDL 39 (L) 11/25/2016   LDLCALC 74 11/25/2016   TRIG 83 10/17/2019   No results found for: DDIMER  Radiology/Studies:  Dg Chest Portable 1 View  Result Date: 10/17/2019 CLINICAL DATA:  Shortness of breath. Hypoxia. Dialysis patient. EXAM: PORTABLE CHEST 1 VIEW COMPARISON:  Two-view chest x-ray 10/01/2017 FINDINGS: Chronic tracheostomy is noted. The heart is enlarged. Mild pulmonary vascular congestion is present. Bibasilar airspace disease is new. Bilateral pleural effusions are present. There is no significant consolidation in the upper lobes. The visualized soft tissues and bony thorax are unremarkable. IMPRESSION: 1. Cardiomegaly with mild pulmonary vascular congestion and bilateral pleural effusions compatible with congestive heart failure. 2. Bibasilar airspace disease may represent atelectasis, but is concerning for bibasilar pneumonia. Electronically Signed   By: San Morelle M.D.   On: 10/17/2019 05:18    Wt Readings from Last 3 Encounters:  10/17/19 65.7 kg  10/06/17 63.5 kg  10/03/17 61 kg    EKG: nsr with non  specific st t wave changes  Physical Exam:AA female in no acute distress Blood pressure (!) 145/78, pulse 79, temperature (!) 100.6 F (38.1 C), temperature source Oral, resp. rate 20, height 5' 2"  (1.575 m), weight 65.7 kg, SpO2 98 %. Body mass index is 26.49 kg/m. General: Well developed, well nourished, in no acute distress. Head: Normocephalic, atraumatic, sclera non-icteric, no xanthomas, nares are without discharge.  Neck: Negative for carotid bruits. JVD not elevated. Lungs: Clear bilaterally to auscultation without wheezes, rales, or rhonchi. Breathing is unlabored. Heart: RRR with S1 S2. No murmurs, rubs, or gallops appreciated. Abdomen: Soft, non-tender, non-distended with normoactive bowel sounds. No hepatomegaly. No rebound/guarding. No obvious abdominal masses. Msk:  Strength and tone appear normal for age. Extremities: No clubbing or cyanosis. Fistula in left upper arm.  Distal pedal pulses are 2+ and equal bilaterally. Neuro: Alert and oriented X 3. No facial asymmetry. No focal deficit. Moves all extremities spontaneously. Psych:  Responds to questions appropriately with a normal affect.     Assessment and Plan  63 y.o. 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 Improved after hemodialysis which was limited somewhat due to hypotension during HD. Was on iv ntg drip for accellerated hypertension on presentation. NTG drip currrently off.  CHF-has HFpEF with ef normal ef by echo at [facility] in December of last year.Will attempt to control with iv lasix 40  bid which has been ordered and  continue with hd as needed.  Carvedilol 25 mg bid. Will evaluatae with echo for interval change in lv funciton  Paroxysmal afib-is on apixiban 2.5 mg bid and amiodarone woo m daily. Currently in nsr. Will continue with this regimen.   Cad/mitral valve disease. 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 Had inferior posterior stemi in 2013 with a des to [place]. Underwent emergent mvr with a tissue prosthesis due to ruptured posterior papillary muscle. Pst op course complicated by ckd and prolonged intubation requiring trach. Currently appears stable from ischemic [place]. Will review echo shen available to evaluate lv funciton and valves. Conitnue with current outpatient regimen for now. Includes asa 81 mg daily and atorvastatin at 80 mg daily.   HTN-currently controlled after hd. Will reuire midodrine during hd. Continue off of iv ntg for now. Continue with carvedilol at 25 mg dialy and follow.   Hyperlipidemia-conitnue with atorvastatin.   DM-insultin as is being done.   Signed, Teodoro Spray MD 10/17/2019, 12:56 PM Pager: [phone]

## 2019-10-17 NOTE — ED Provider Notes (Signed)
[facility] Emergency Department Provider Note  ____________________________________________  Time seen: Approximately 5:10 AM  I have reviewed the triage vital signs and the nursing notes.   HISTORY  Chief Complaint Shortness of Breath    HPI Deborah [name] is a 63 y.o. female with a history of hypertension diabetes end-stage renal disease on hemodialysis who comes the ED complaining of shortness of breath that started 24 hours ago, associated with productive cough.  Denies fever chills body aches or sweats, no sick contacts.  No chest pain.  Due to worsening shortness of breath, EMS was called this evening and found room air oxygen saturation of 81%.  Place the patient on nonrebreather for transport.  Patient does have a tracheostomy. Patient Is a dialysis Monday Wednesday Friday, last went 2 days ago on Wednesday.      Past Medical History:  Diagnosis Date  . Diabetes mellitus   . Hyperlipidemia   . Hypertension   . MI, old   . Renal disorder    dialysls     Patient Active Problem List   Diagnosis Date Noted  . Acute CHF (congestive heart failure) ([place]) 10/17/2019  . Diabetic neuropathy ([place]) 10/16/2017  . Tracheobronchitis 10/11/2017  . Hemorrhage from arteriovenous dialysis graft ([place]) 10/05/2017  . Sepsis ([place]) 09/08/2017  . Adjustment disorder with anxious mood 12/09/2016  . Bacteremia due to Staphylococcus aureus   . Staphylococcus aureus bacteremia with sepsis ([place])   . Uncontrolled type 2 diabetes mellitus with complication ([place])   . ST elevation myocardial infarction involving left main coronary artery ([place])   . Palliative care encounter   . Advance care planning   . Vocal cord dysfunction   . Palliative care by specialist   . Goals of care, counseling/discussion   . Bacteremia   . End-stage renal disease on hemodialysis ([place])   . Paroxysmal atrial fibrillation ([name])   . Heart attack ([place])   . Uncontrolled type 2 diabetes  mellitus with complication, without long-term current use of insulin ([place])   . Staphylococcus aureus bacteremia 11/12/2016  . Vocal cord paralysis   . Respiratory distress 11/02/2016  . Chronic anticoagulation   . Acute lower UTI   . Labile blood glucose   . Leukocytosis   . Subtherapeutic international normalized ratio (INR)   . Supratherapeutic INR   . Cough   . Oropharyngeal dysphagia   . Uncontrolled type 2 diabetes mellitus with complication, with long-term current use of insulin ([place])   . Physical debility 10/11/2016  . Metabolic encephalopathy 74/94/4967  . Debility   . ESRD on dialysis ([place])   . Type 2 diabetes mellitus with complication, with long-term current use of insulin ([place])   . Anemia of chronic disease   . Coronary artery disease involving native coronary artery of native heart without angina pectoris   . Acute on chronic respiratory failure with hypoxemia ([place])   . Atrial flutter ([place])   . Persistent cough   . Dysphonia   . Sleep disturbance   . Diabetes mellitus type 2 in nonobese ([name])   . Stage 3 chronic kidney disease   . NSTEMI (non-ST elevated myocardial infarction) ([place])   . Tachypnea   . Hyponatremia   . Lymphocytosis   . Acute blood loss anemia   . Pressure injury of skin 09/18/2016  . History of tracheostomy   . Delirium   . Encounter for attention to tracheostomy ([place])   . History of MVR with cardiopulmonary bypass   .  Systolic congestive heart failure ([place])   . HCAP (healthcare-associated pneumonia)   . PAF (paroxysmal atrial fibrillation) ([place])   . Acute systolic congestive heart failure ([place])   . AKI (acute kidney injury) ([place])   . Abnormal LFTs   . S/P MVR (mitral valve replacement)   . Mitral regurgitation 08/23/2016  . Acute on chronic diastolic heart failure ([place]) 08/18/2016  . Acute pulmonary edema ([name])   . Respiratory failure ([place]) 08/17/2016  . Cardiogenic shock ([place]) 08/17/2016  . Acute hypoxemic respiratory failure ([place])   .  STEMI (ST elevation myocardial infarction) ([place]) 08/15/2016  . Noncompliance 01/25/2015  . Bereavement 07/09/2014  . Low back pain 02/17/2014  . Cervical cancer screening 11/20/2013  . Routine general medical examination at a health care facility 10/14/2013  . Chronic kidney disease, stage 3 06/14/2012  . Hypertension 03/08/2012  . Diabetes mellitus type 2, controlled 03/08/2012  . Hyperlipidemia 03/08/2012     Past Surgical History:  Procedure Laterality Date  . A/V FISTULAGRAM N/A 10/05/2017   Procedure: A/V Fistulagram;  Surgeon: Katha Cabal, MD;  Location: [place] [facility];  Service: Cardiovascular;  Laterality: N/A;  . A/V SHUNTOGRAM N/A 10/05/2017   Procedure: A/V SHUNTOGRAM;  Surgeon: Katha Cabal, MD;  Location: [place] [facility];  Service: Cardiovascular;  Laterality: N/A;  . AV FISTULA PLACEMENT Left 10/02/2016   Procedure: INSERTION OF ARTERIOVENOUS (AV) GORE-TEX GRAFT ARM;  Surgeon: Waynetta Sandy, MD;  Location: [place];  Service: Vascular;  Laterality: Left;  . CARDIAC CATHETERIZATION N/A 08/15/2016   Procedure: Left Heart Cath and Coronary Angiography;  Surgeon: Yolonda Kida, MD;  Location: [place] [facility];  Service: Cardiovascular;  Laterality: N/A;  . CARDIAC CATHETERIZATION N/A 08/15/2016   Procedure: Coronary Stent Intervention;  Surgeon: Yolonda Kida, MD;  Location: [place] [facility];  Service: Cardiovascular;  Laterality: N/A;  . CARDIAC CATHETERIZATION N/A 08/18/2016   Procedure: Right Heart Cath;  Surgeon: Jolaine Artist, MD;  Location: [place] [facility];  Service: Cardiovascular;  Laterality: N/A;  . CARDIAC CATHETERIZATION N/A 08/18/2016   Procedure: IABP Insertion;  Surgeon: Jolaine Artist, MD;  Location: [place] [facility];  Service: Cardiovascular;  Laterality: N/A;  . CARDIAC CATHETERIZATION Right 08/23/2016   Procedure: CENTRAL LINE INSERTION RIGHT SUBCLAVIAN;  Surgeon: Ivin Poot, MD;  Location: [place];   Service: Open Heart Surgery;  Laterality: Right;  . ENDOVEIN HARVEST OF GREATER SAPHENOUS VEIN Left 08/23/2016   Procedure: ENDOVEIN HARVEST OF GREATER SAPHENOUS VEIN;  Surgeon: Ivin Poot, MD;  Location: [place];  Service: Open Heart Surgery;  Laterality: Left;  . INSERTION OF DIALYSIS CATHETER Bilateral 08/31/2016   Procedure: INSERTION OF DIALYSIS CATHETER LEFT INTERNAL JUGULAR VEIN & INSERTION OF TRIPLE LUMEN RIGHT INTERNAL JUGULAR VEIN;  Surgeon: Angelia Mould, MD;  Location: [place];  Service: Vascular;  Laterality: Bilateral;  . INTRAOPERATIVE TRANSESOPHAGEAL ECHOCARDIOGRAM N/A 08/23/2016   Procedure: INTRAOPERATIVE TRANSESOPHAGEAL ECHOCARDIOGRAM;  Surgeon: Ivin Poot, MD;  Location: [place];  Service: Open Heart Surgery;  Laterality: N/A;  . IR GENERIC HISTORICAL  11/13/2016   IR US GUIDE VASC ACCESS LEFT 11/13/2016 Corrie Mckusick, DO [facility]  . IR GENERIC HISTORICAL  11/13/2016   IR FLUORO GUIDE CV LINE LEFT 11/13/2016 Corrie Mckusick, DO [facility]  . IR GENERIC HISTORICAL  11/13/2016   IR GASTROSTOMY TUBE MOD SED 11/13/2016 Corrie Mckusick, DO [facility]  . MITRAL VALVE REPAIR N/A 08/23/2016   Procedure: MITRAL  VALVE REPAIR (MVR) USING 25MM EDWARDS MAGNA EASE BIOPROSTHESIS MITRAL  VALVE;  Surgeon: Ivin Poot, MD;  Location: [place];  Service: Open Heart Surgery;  Laterality: N/A;  . tracheostomy reversal    . TRACHEOSTOMY TUBE PLACEMENT N/A 11/09/2016   Procedure: TRACHEOSTOMY;  Surgeon: Melissa Montane, MD;  Location: [place];  Service: ENT;  Laterality: N/A;  . VAGINAL DELIVERY     x 6     Prior to Admission medications   Medication Sig Start Date End Date Taking? Authorizing Provider  acetaminophen (TYLENOL) 325 MG tablet Take 650 mg by mouth. 04/06/17   [provider]  albuterol (PROVENTIL) (2.5 MG/3ML) 0.083% nebulizer solution Take 3 mLs (2.5 mg total) by nebulization 4 (four) times daily - after meals and at bedtime. 11/01/16   Love, Ivan Anchors, PA-C   ALPRAZolam (XANAX) 0.25 MG tablet Take 1 tablet (0.25 mg total) by mouth 2 (two) times daily as needed for anxiety. 11/01/16   Love, Ivan Anchors, PA-C  amiodarone (PACERONE) 200 MG tablet Take 1 tablet (200 mg total) by mouth daily. 11/01/16   Love, Ivan Anchors, PA-C  apixaban (ELIQUIS) 2.5 MG TABS tablet Take 2.5 mg by mouth 2 (two) times daily.  06/22/17   [provider]  atorvastatin (LIPITOR) 80 MG tablet Take 80 mg by mouth daily at 6 PM.     [provider]  B Complex-C-Folic Acid (VOL-CARE RX PO) Take 1 tablet by mouth daily.    [provider]  Blood Glucose Monitoring Suppl (GLUCOCOM BLOOD GLUCOSE MONITOR) DEVI Use to check blood sugars 4-6 times per day Disp per insurance company kit ICD-10 E11.9 07/18/17   [provider]  carvedilol (COREG) 25 MG tablet Take 25 mg by mouth 2 (two) times daily. 04/22/18   [provider]  clopidogrel (PLAVIX) 75 MG tablet Take 75 mg by mouth daily.    [provider]  furosemide (LASIX) 40 MG tablet Take 40 mg by mouth daily. ON mornings of dialysis    [provider]  gabapentin (NEURONTIN) 100 MG capsule Take 1 capsule (100 mg total) 2 (two) times daily by mouth. 10/16/17   Edrick Kins, DPM  gabapentin (NEURONTIN) 300 MG capsule TAKE 1 CAPSULE BY MOUTH THREE TIMES DAILY 09/18/19   Edrick Kins, DPM  insulin glargine (LANTUS) 100 UNIT/ML injection Inject 14 Units into the skin at bedtime.    [provider]  Melatonin 3 MG TABS Take 3-6 mg by mouth at bedtime.  04/06/17   [provider]  midodrine (PROAMATINE) 5 MG tablet Take 5 mg by mouth 3 (three) times a week.    [provider]  NONFORMULARY OR COMPOUNDED ITEM See pharmacy note 07/20/17   Edrick Kins, DPM  pantoprazole (PROTONIX) 40 MG tablet Take 1 tablet (40 mg total) by mouth 2 (two) times daily. 11/01/16   Love, Ivan Anchors, PA-C  polyethylene glycol (MIRALAX / GLYCOLAX) packet Take 17 g by mouth daily.     [provider]  QUEtiapine (SEROQUEL) 50 MG tablet Take 25 mg by mouth 3 (three) times daily as needed.  04/06/17   [provider]  sevelamer carbonate (RENVELA) 800 MG tablet Take 1,600 mg by mouth 3 (three) times daily with meals.    [provider]  traZODone (DESYREL) 50 MG tablet  04/04/18   [provider]     Allergies Patient has no known allergies.   Family History  Problem Relation Age of Onset  .  Hypertension Mother   . Diabetes Mother   . Breast cancer Sister     Social History Social History   Tobacco Use  . Smoking status: Never Smoker  . Smokeless tobacco: Never Used  Substance Use Topics  . Alcohol use: No  . Drug use: No    Review of Systems  Constitutional:   No fever or chills.  ENT:   No sore throat. No rhinorrhea. Cardiovascular:   No chest pain or syncope. Respiratory:   Positive shortness of breath and cough. Gastrointestinal:   Negative for abdominal pain, vomiting and diarrhea.  Musculoskeletal:   Negative for focal pain or swelling All other systems reviewed and are negative except as documented above in ROS and HPI.  ____________________________________________   PHYSICAL EXAM:  VITAL SIGNS: ED Triage Vitals  Enc Vitals Group     BP 10/17/19 0432 (!) 184/95     Pulse Rate 10/17/19 0432 89     Resp 10/17/19 0432 20     Temp 10/17/19 0432 99.2 F (37.3 C)     Temp Source 10/17/19 0432 Oral     SpO2 10/17/19 0432 96 %     Weight 10/17/19 0433 145 lb (65.8 kg)     Height 10/17/19 0433 5' 2"  (1.575 m)     Head Circumference --      Peak Flow --      Pain Score 10/17/19 0433 0     Pain Loc --      Pain Edu? --      Excl. in [place]? --     Vital signs reviewed, nursing assessments reviewed.   Constitutional:   Alert and oriented.  Ill-appearing Eyes:   Conjunctivae are normal. EOMI. PERRL. ENT      Head:   Normocephalic and atraumatic.      Nose:   Wearing a mask.      Mouth/Throat:   Wearing a  mask.      Neck:   No meningismus. Full ROM.  No JVD Hematological/Lymphatic/Immunilogical:   No cervical lymphadenopathy. Cardiovascular:   RRR. Symmetric bilateral radial and DP pulses.  No murmurs. Cap refill less than 2 seconds. Respiratory:   Normal respiratory effort without tachypnea/retractions.  Bilateral basilar crackles Gastrointestinal:   Soft and nontender. Non distended. There is no CVA tenderness.  No rebound, rigidity, or guarding. Musculoskeletal:   Normal range of motion in all extremities. No joint effusions.  No lower extremity tenderness.  No edema. Neurologic:   Normal speech and language.  Motor grossly intact. No acute focal neurologic deficits are appreciated.  Skin:    Skin is warm, dry and intact. No rash noted.  No petechiae, purpura, or bullae.  ____________________________________________    LABS (pertinent positives/negatives) (all labs ordered are listed, but only abnormal results are displayed) Labs Reviewed  BASIC METABOLIC PANEL - Abnormal; Notable for the following components:      Result Value   Glucose, Bld 166 (*)    BUN 49 (*)    Creatinine, Ser 6.47 (*)    GFR calc non Af Amer 6 (*)    GFR calc Af Amer 7 (*)    All other components within normal limits  BRAIN NATRIURETIC PEPTIDE - Abnormal; Notable for the following components:   B Natriuretic Peptide 1,350.0 (*)    All other components within normal limits  CBC WITH DIFFERENTIAL/PLATELET - Abnormal; Notable for the following components:   WBC 12.7 (*)    RBC 3.18 (*)    Hemoglobin  9.4 (*)    HCT 28.4 (*)    Neutro Abs 9.7 (*)    Monocytes Absolute 1.1 (*)    All other components within normal limits  TROPONIN I (HIGH SENSITIVITY) - Abnormal; Notable for the following components:   Troponin I (High Sensitivity) 37 (*)    All other components within normal limits  SARS CORONAVIRUS 2 (TAT 6-24 HRS)  PROCALCITONIN  FIBRIN DERIVATIVES D-DIMER ([facility] ONLY)  LACTATE DEHYDROGENASE   FERRITIN  TRIGLYCERIDES  FIBRINOGEN  C-REACTIVE PROTEIN  HIV ANTIBODY (ROUTINE TESTING W REFLEX)  TSH  MAGNESIUM  TROPONIN I (HIGH SENSITIVITY)   ____________________________________________   EKG  Interpreted by me Sinus rhythm rate of 89, normal axis and intervals.  Normal QRS ST segments and T waves.  ____________________________________________    GNFAOZHYQ  Dg Chest Portable 1 View  Result Date: 10/17/2019 CLINICAL DATA:  Shortness of breath. Hypoxia. Dialysis patient. EXAM: PORTABLE CHEST 1 VIEW COMPARISON:  Two-view chest x-ray 10/01/2017 FINDINGS: Chronic tracheostomy is noted. The heart is enlarged. Mild pulmonary vascular congestion is present. Bibasilar airspace disease is new. Bilateral pleural effusions are present. There is no significant consolidation in the upper lobes. The visualized soft tissues and bony thorax are unremarkable. IMPRESSION: 1. Cardiomegaly with mild pulmonary vascular congestion and bilateral pleural effusions compatible with congestive heart failure. 2. Bibasilar airspace disease may represent atelectasis, but is concerning for bibasilar pneumonia. Electronically Signed   By: San Morelle M.D.   On: 10/17/2019 05:18    ____________________________________________   PROCEDURES .Critical Care Performed by: Carrie Mew, MD Authorized by: Carrie Mew, MD   Critical care provider statement:    Critical care time (minutes):  35   Critical care time was exclusive of:  Separately billable procedures and treating other patients   Critical care was necessary to treat or prevent imminent or life-threatening deterioration of the following conditions:  Respiratory failure   Critical care was time spent personally by me on the following activities:  Development of treatment plan with patient or surrogate, discussions with consultants, evaluation of patient's response to treatment, examination of patient, obtaining history from  patient or surrogate, ordering and performing treatments and interventions, ordering and review of laboratory studies, ordering and review of radiographic studies, pulse oximetry, re-evaluation of patient's condition and review of old charts    ____________________________________________  DIFFERENTIAL DIAGNOSIS   Pulmonary edema, pleural effusion, pneumonia, COVID-19, pneumothorax, non-STEMI, congestive heart failure  CLINICAL IMPRESSION / ASSESSMENT AND PLAN / ED COURSE  Medications ordered in the ED: Medications  vancomycin (VANCOCIN) IVPB 1000 mg/200 mL premix (has no administration in time range)  ceFEPIme (MAXIPIME) 2 g in sodium chloride 0.9 % 100 mL IVPB (2 g Intravenous New Bag/Given 10/17/19 0551)  nitroGLYCERIN 50 mg in dextrose 5 % 250 mL (0.2 mg/mL) infusion (has no administration in time range)  vancomycin (VANCOCIN) 500 mg in sodium chloride 0.9 % 100 mL IVPB (has no administration in time range)  amiodarone (PACERONE) tablet 200 mg (has no administration in time range)  atorvastatin (LIPITOR) tablet 80 mg (has no administration in time range)  carvedilol (COREG) tablet 25 mg (has no administration in time range)  ALPRAZolam (XANAX) tablet 0.25 mg (has no administration in time range)  midodrine (PROAMATINE) tablet 5 mg (has no administration in time range)  QUEtiapine (SEROQUEL) tablet 25 mg (has no administration in time range)  insulin glargine (LANTUS) injection 14 Units (has no administration in time range)  pantoprazole (PROTONIX) EC tablet 40 mg (has no administration  in time range)  polyethylene glycol (MIRALAX / GLYCOLAX) packet 17 g (has no administration in time range)  sevelamer carbonate (RENVELA) tablet 1,600 mg (has no administration in time range)  Melatonin TABS 3-6 mg (has no administration in time range)  gabapentin (NEURONTIN) capsule 100 mg (has no administration in time range)  gabapentin (NEURONTIN) capsule 300 mg (has no administration in time  range)  Vol-Care Rx TABS (has no administration in time range)  albuterol (PROVENTIL) (2.5 MG/3ML) 0.083% nebulizer solution 2.5 mg (has no administration in time range)  sodium chloride flush (NS) 0.9 % injection 3 mL (has no administration in time range)  sodium chloride flush (NS) 0.9 % injection 3 mL (has no administration in time range)  0.9 %  sodium chloride infusion (has no administration in time range)  acetaminophen (TYLENOL) tablet 650 mg (has no administration in time range)  ondansetron (ZOFRAN) injection 4 mg (has no administration in time range)  enoxaparin (LOVENOX) injection 40 mg (has no administration in time range)  furosemide (LASIX) injection 40 mg (has no administration in time range)  aspirin EC tablet 81 mg (has no administration in time range)  zolpidem (AMBIEN) tablet 5 mg (has no administration in time range)  cefTRIAXone (ROCEPHIN) 2 g in sodium chloride 0.9 % 100 mL IVPB (has no administration in time range)  azithromycin (ZITHROMAX) 500 mg in sodium chloride 0.9 % 250 mL IVPB (has no administration in time range)  guaiFENesin (MUCINEX) 12 hr tablet 600 mg (has no administration in time range)  chlorpheniramine-HYDROcodone (TUSSIONEX) 10-8 MG/5ML suspension 5 mL (has no administration in time range)  labetalol (NORMODYNE) injection 20 mg (has no administration in time range)    Pertinent labs & imaging results that were available during my care of the patient were reviewed by me and considered in my medical decision making (see chart for details).  RAECHAL RABEN was evaluated in Emergency Department on 10/17/2019 for the symptoms described in the history of present illness. She was evaluated in the context of the global COVID-19 pandemic, which necessitated consideration that the patient might be at risk for infection with the SARS-CoV-2 virus that causes COVID-19. Institutional protocols and algorithms that pertain to the evaluation of patients at risk for COVID-19  are in a state of rapid change based on information released by regulatory bodies including the CDC and federal and state organizations. These policies and algorithms were followed during the patient's care in the ED.     Clinical Course as of Oct 16 614  Fri Oct 17, 2019  6767 Patient presents with cough, shortness of breath, bilateral basilar crackles and hypoxia with room air oxygen saturation of about 82%.  Will check labs and chest x-ray.  Clinically does not appear volume overloaded based on lack of JVD or peripheral edema, but lung crackles and severe hypertension could be due to volume overload versus pneumonia.  Not septic on initial assessment.   [PS]    Clinical Course User Index [PS] Carrie Mew, MD    ----------------------------------------- 6:15 AM on 10/17/2019 ----------------------------------------- Labs show elevated BNP.  Troponin is 37 which is not concerning for ACS at this time.  CBC and chemistry panel are overall unremarkable.  Chest x-ray shows both pleural effusions concerning for volume overload/CHF as well as bibasilar airspace disease concerning for multifocal pneumonia.  Broad-spectrum antibiotics to cover HCAP ordered.  Nitroglycerin infusion started to provide venodilation and help alleviate volume overload in the lungs to improve symptoms until dialysis can be  arranged.  Currently dialysis will have to be on hold until Covid result is available.    ____________________________________________   FINAL CLINICAL IMPRESSION(S) / ED DIAGNOSES    Final diagnoses:  Acute respiratory failure with hypoxia ([name])  ESRD on hemodialysis ([place])  HCAP (healthcare-associated pneumonia)  Tracheostomy tube present [facility])     ED Discharge Orders    None      Portions of this note were generated with dragon dictation software. Dictation errors may occur despite best attempts at proofreading.   Carrie Mew, MD 10/17/19 [phone]

## 2019-10-17 NOTE — ED Notes (Signed)
Attempted to call report to dialysis- no answer

## 2019-10-17 NOTE — Progress Notes (Signed)
[facility]  ROUNDING NOTE   Subjective:   [name] admitted to [facility] on 10/17/2019 for breathing difficulty Last hemodialysis treatment was Wednesday 11/11.   Son at bedside.   Patient presents with shortness of breath, productive cough, and hypoxia.   COVID-19 screen is pending.   Objective:  Vital signs in last 24 hours:  Temp:  [99.2 F (37.3 C)] 99.2 F (37.3 C) (11/13 0432) Pulse Rate:  [82-89] 82 (11/13 0945) Resp:  [20-25] 25 (11/13 0730) BP: (154-189)/(81-95) 154/81 (11/13 0945) SpO2:  [94 %-96 %] 94 % (11/13 0945) Weight:  [65.8 kg] 65.8 kg (11/13 0433)  Weight change:  Filed Weights   10/17/19 0433  Weight: 65.8 kg    Intake/Output: I/O last 3 completed shifts: In: 100 [IV Piggyback:100] Out: -    Intake/Output this shift:  Total I/O In: 300 [IV Piggyback:300] Out: -   Physical Exam: General: Sitting up in stretch  Head: Normocephalic, atraumatic. Moist oral mucosal membranes  Eyes: Anicteric, PERRL  Neck: +tracheostomy  Lungs:  Coarse bilateral breath sounds.   Heart: Regular rate and rhythm  Abdomen:  Soft, nontender  Extremities:  trace peripheral edema.  Neurologic: Nonfocal, moving all four extremities  Skin: No lesions  Access: Left AVG    Basic Metabolic Panel: Recent Labs  Lab 10/17/19 0445  NA 140  K 3.8  CL 98  CO2 27  GLUCOSE 166*  BUN 49*  CREATININE 6.47*  CALCIUM 8.9  MG 1.8    Liver Function Tests: No results for input(s): AST, ALT, ALKPHOS, BILITOT, PROT, ALBUMIN in the last 168 hours. No results for input(s): LIPASE, AMYLASE in the last 168 hours. No results for input(s): AMMONIA in the last 168 hours.  CBC: Recent Labs  Lab 10/17/19 0445  WBC 12.7*  NEUTROABS 9.7*  HGB 9.4*  HCT 28.4*  MCV 89.3  PLT 250    Cardiac Enzymes: No results for input(s): CKTOTAL, CKMB, CKMBINDEX, TROPONINI in the last 168 hours.  BNP: Invalid input(s): POCBNP  CBG: Recent Labs  Lab 10/17/19 0942   GLUCAP 177*    Microbiology: Results for orders placed or performed during the hospital encounter of 10/01/17  Blood Culture (routine x 2)     Status: None   Collection Time: 10/01/17  6:23 PM   Specimen: BLOOD  Result Value Ref Range Status   Specimen Description BLOOD MYCOBACTERIUM AVIUM COMPLEX  Final   Special Requests AEROBIC BOTTLE ONLY Blood Culture adequate volume  Final   Culture NO GROWTH 5 DAYS  Final   Report Status 10/06/2017 FINAL  Final  Blood Culture (routine x 2)     Status: None   Collection Time: 10/01/17  6:50 PM   Specimen: BLOOD  Result Value Ref Range Status   Specimen Description BLOOD RESISTANT FOREARM  Final   Special Requests   Final    BOTTLES DRAWN AEROBIC AND ANAEROBIC Blood Culture results may not be optimal due to an inadequate volume of blood received in culture bottles   Culture NO GROWTH 5 DAYS  Final   Report Status 10/06/2017 FINAL  Final    Coagulation Studies: No results for input(s): LABPROT, INR in the last 72 hours.  Urinalysis: No results for input(s): COLORURINE, LABSPEC, PHURINE, GLUCOSEU, HGBUR, BILIRUBINUR, KETONESUR, PROTEINUR, UROBILINOGEN, NITRITE, LEUKOCYTESUR in the last 72 hours.  Invalid input(s): APPERANCEUR    Imaging: Dg Chest Portable 1 View  Result Date: 10/17/2019 CLINICAL DATA:  Shortness of breath. Hypoxia. Dialysis patient. EXAM:  PORTABLE CHEST 1 VIEW COMPARISON:  Two-view chest x-ray 10/01/2017 FINDINGS: Chronic tracheostomy is noted. The heart is enlarged. Mild pulmonary vascular congestion is present. Bibasilar airspace disease is new. Bilateral pleural effusions are present. There is no significant consolidation in the upper lobes. The visualized soft tissues and bony thorax are unremarkable. IMPRESSION: 1. Cardiomegaly with mild pulmonary vascular congestion and bilateral pleural effusions compatible with congestive heart failure. 2. Bibasilar airspace disease may represent atelectasis, but is concerning for  bibasilar pneumonia. Electronically Signed   By: San Morelle M.D.   On: 10/17/2019 05:18     Medications:   . sodium chloride    . azithromycin 500 mg (10/17/19 0923)  . cefTRIAXone (ROCEPHIN)  IV    . nitroGLYCERIN 30 mcg/min (10/17/19 0920)   . albuterol  2.5 mg Nebulization TID PC & HS  . amiodarone  200 mg Oral Daily  . aspirin EC  81 mg Oral Daily  . atorvastatin  80 mg Oral q1800  . carvedilol  25 mg Oral BID  . Chlorhexidine Gluconate Cloth  6 each Topical Q0600  . enoxaparin (LOVENOX) injection  40 mg Subcutaneous Q24H  . furosemide  40 mg Intravenous Q12H  . gabapentin  100 mg Oral BID  . gabapentin  300 mg Oral TID  . guaiFENesin  600 mg Oral BID  . insulin aspart  0-9 Units Subcutaneous TID WC  . insulin glargine  14 Units Subcutaneous QHS  . Melatonin  3-6 mg Oral QHS  . midodrine  5 mg Oral Once per day on Mon Wed Fri  . pantoprazole  40 mg Oral BID  . polyethylene glycol  17 g Oral Daily  . sevelamer carbonate  1,600 mg Oral TID WC  . sodium chloride flush  3 mL Intravenous Q12H  . Vol-Care Rx   Oral Daily   sodium chloride, acetaminophen, ALPRAZolam, chlorpheniramine-HYDROcodone, labetalol, labetalol, ondansetron (ZOFRAN) IV, QUEtiapine, sodium chloride flush, zolpidem  Assessment/ Plan:  [name] is a 63 y.o. black female with end stage renal disease on hemodialysis, tracheostomy, hypotension, diabetes mellitus type II insulin dependent, diabetic neuropathy,  coronary artery disease, mitral valve repair, atrial flutter on apixaban  [facility] MWF [facility] left AVG Chronic tracheostomy  1. End Stage Renal Disease: with pulmonary edema - emergent dialysis for today. Orders prepared.  2. Respiratory failure: requiring oxygen.  Status post tracheostomy - empiric antibiotics - emergent hemodialysis treatment as above.  - COVID-19 pending.   3. Hypertension: usually requires midodrine before dialysis. However with elevated  blood pressure on encounter. Will hold midodrine before dialysis.  4. Anemia of chronic kidney disease: hemoglobin 9.4 - EPO with HD treatment   LOS: 0 Hailey Stormer 11/13/202010:11 AM

## 2019-10-17 NOTE — ED Notes (Signed)
Notified by dialysis RN that pt's BP had dropped during treatment and nitro drip was paused as a result. BP checked by this RN: 83/69 (MAP 76). NT remains stopped at this time. Pt appears slightly more lethargic and not as quickly responsive as earlier. Midodrine given. CBG 94. Dialysis RN states she will stop treatment for today. Fluid removed 2563mL.

## 2019-10-17 NOTE — ED Triage Notes (Signed)
Pt arrives to ED via [facility] from home with c/o shortness of breath/breathing difficulty that started around 24hrs ago with a cough. EMS reports pt's O2 sats were 81% on RA upon their arrival; improved to the low 90's when placed on 15L O2 NRB. Pt is a dialysis pt (M-W-F) and reports a full treatment was completed on Wed. Pt denies body aches, fever, or chills. Dr Joni Fears at bedside upon pt's arrival to ED.

## 2019-10-17 NOTE — Progress Notes (Signed)
Pre HD Tx Assessment   10/17/19 1145  Neurological  Level of Consciousness Alert  Orientation Level Oriented X4  Respiratory  Respiratory Pattern Regular;Accessory muscle use  Chest Assessment Chest expansion symmetrical  R Upper  Breath Sounds Coarse crackles  L Upper Breath Sounds Coarse crackles  R Lower Breath Sounds Diminished  L Lower Breath Sounds Diminished  Cardiac  Pulse Regular  Heart Sounds S1, S2  Jugular Venous Distention (JVD) No  ECG Monitor Yes  Antiarrhythmic device No  Vascular  R Radial Pulse +2  L Radial Pulse +2  Musculoskeletal  Musculoskeletal (WDL) X  Generalized Weakness Yes  Gastrointestinal  Bowel Sounds Assessment Hypoactive  Last BM Date 10/17/19  GU Assessment  Genitourinary (WDL) X  Genitourinary Symptoms Oliguria (HD Pt)  Psychosocial  Psychosocial (WDL) WDL

## 2019-10-17 NOTE — Progress Notes (Signed)
HD Tx Completed   10/17/19 1530  Vital Signs  Pulse Rate 78  BP 117/69  Oxygen Therapy  SpO2 98 %  O2 Device Tracheostomy Collar  O2 Flow Rate (L/min) 13 L/min  Pain Assessment  Pain Scale 0-10  Pain Score 0

## 2019-10-17 NOTE — Progress Notes (Signed)
PHARMACIST - PHYSICIAN ORDER COMMUNICATION  CONCERNING: P&T Medication Policy on Herbal Medications  DESCRIPTION:  This patient's order for:  Vol-Care RX tablet  has been noted.  This product(s) is classified as an "herbal" or natural product. Due to a lack of definitive safety studies or FDA approval, nonstandard manufacturing practices, plus the potential risk of unknown drug-drug interactions while on inpatient medications, the Pharmacy and Therapeutics Committee does not permit the use of "herbal" or natural products of this type within [facility].   ACTION TAKEN: The pharmacy department is unable to verify this order at this time and your patient has been informed of this safety policy. Please reevaluate patient's clinical condition at discharge and address if the herbal or natural product(s) should be resumed at that time.

## 2019-10-17 NOTE — ED Notes (Addendum)
Sent pharmacy a message to verify orders for anitbiotics

## 2019-10-17 NOTE — ED Notes (Signed)
Dialysis at bedside

## 2019-10-17 NOTE — ED Notes (Signed)
Meal tray given, son at bedside assisting with feeding

## 2019-10-17 NOTE — ED Notes (Signed)
Attempted to give report to floor nurse, Darlene. She states that she is reviewing her chart and will call back in 5 minutes.

## 2019-10-17 NOTE — ED Notes (Signed)
IV team at bedside 

## 2019-10-17 NOTE — Progress Notes (Signed)
IV team unable to attempt IV start at this time due to Resp working with the patient, RN made aware also patient states she needed to use the bathroom

## 2019-10-17 NOTE — Progress Notes (Signed)
Pt was suctioned for a small amount of thick yellow secretions. The inner cannula was changed. The old inner cannula was slightly clogged with thick yellow secretions. A 35% aerosol trach collar was applied with humidity. Sa02 is 97%.

## 2019-10-17 NOTE — ED Notes (Signed)
IV team back at bedside

## 2019-10-17 NOTE — Progress Notes (Signed)
PHARMACY -  BRIEF ANTIBIOTIC NOTE   Pharmacy has received consult(s) for Vancomycin and Cefepime from an ED provider.  The patient's profile has been reviewed for ht/wt/allergies/indication/available labs.    One time order(s) placed for Vancomycin 1000mg  + 500mg  additional to make 1500mg  total loading dose and Cefepime 2000mg   Further antibiotics/pharmacy consults should be ordered by admitting physician if indicated.                       Thank you, Hart Robinsons A 10/17/2019  5:48 AM

## 2019-10-17 NOTE — Progress Notes (Signed)
Established hemodialysis patient known at [facility] MWF 10:40, patient transports with [facility]. Please contact me with any dialysis placement concerns.  Elvera Bicker Dialysis Coordinator [phone]

## 2019-10-17 NOTE — Progress Notes (Signed)
PHARMACIST - PHYSICIAN COMMUNICATION  CONCERNING:  Enoxaparin (Lovenox) for DVT Prophylaxis    RECOMMENDATION: Patient was prescribed enoxaprin 40mg  q24 hours for VTE prophylaxis.   Filed Weights   10/17/19 0433  Weight: 145 lb (65.8 kg)    Body mass index is 26.52 kg/m.  Estimated Creatinine Clearance: 8 mL/min (A) (by C-G formula based on SCr of 6.47 mg/dL (H)).  Patient is candidate for SQ heparin 5000 units q12h based on non-surgical patient with ESRD on HD  DESCRIPTION: Pharmacy has adjusted DVT prophylaxis per [facility] policy.  Patient is now receiving SQ heparin 5000 units q12h  [place] Resident 10/17/2019 10:53 AM

## 2019-10-17 NOTE — H&P (Addendum)
Deborah [name] at [place] NAME: Deborah [name]    MR#:  [id number]  DATE OF BIRTH:  1956/08/05  DATE OF ADMISSION:  10/17/2019  PRIMARY CARE PHYSICIAN: System, Pcp Not In   REQUESTING/REFERRING PHYSICIAN: Brenton Grills, MD CHIEF COMPLAINT:   Chief Complaint  Patient presents with   Shortness of Breath    HISTORY OF PRESENT ILLNESS:  Deborah [name]  is a 63 y.o. African-American female with a known history of type II arrhythmias, hypertension, dyslipidemia and end-stage renal disease on hemodialysis on Monday Wednesday Friday, who presented to the emergency room with acute onset of worsening dyspnea and hypoxia with a pulse ox entry of 81% on room air.  Patient is status post tracheostomy but is not on home O2.  She has been having dry cough here occasionally productive of clear sputum at home.  She admitted to orthopnea and paroxysmal nocturnal dyspnea.  She had no fever or chills.  No worsening lower extremity edema.  No headache or dizziness or blurred vision.  No nausea vomiting or abdominal pain.  She did not miss any hemodialysis session.  Upon presentation to the emergency room, blood pressure was 184/95 and later 200/89 with pulse currently of 96% on her percent nonrebreather and a temperature of 99.2.  Labs revealed mild cytosis with WBC of 12.7 and neutrophilia.  ANC/ALC ratio was 9.7/1.4.  BNP was 1350 and high-sensitivity troponin I of 37.  CMP showed BUN of 49 creatinine 6.47.  Blood cultures were ordered.  The patient was given IV cefepime and vancomycin as well as IV nitroglycerin drip.  She will be admitted to a telemetry bed for further evaluation and management. PAST MEDICAL HISTORY:   Past Medical History:  Diagnosis Date   Diabetes mellitus    Hyperlipidemia    Hypertension    MI, old    Renal disorder    dialysls    PAST SURGICAL HISTORY:   Past Surgical History:  Procedure Laterality Date   A/V FISTULAGRAM N/A 10/05/2017   Procedure: A/V Fistulagram;  Surgeon: Katha Cabal, MD;  Location: [facility];  Service: Cardiovascular;  Laterality: N/A;   A/V SHUNTOGRAM N/A 10/05/2017   Procedure: A/V SHUNTOGRAM;  Surgeon: Katha Cabal, MD;  Location: [facility];  Service: Cardiovascular;  Laterality: N/A;   AV FISTULA PLACEMENT Left 10/02/2016   Procedure: INSERTION OF ARTERIOVENOUS (AV) GORE-TEX GRAFT ARM;  Surgeon: Waynetta Sandy, MD;  Location: [facility];  Service: Vascular;  Laterality: Left;   CARDIAC CATHETERIZATION N/A 08/15/2016   Procedure: Left Heart Cath and Coronary Angiography;  Surgeon: Yolonda Kida, MD;  Location: [facility];  Service: Cardiovascular;  Laterality: N/A;   CARDIAC CATHETERIZATION N/A 08/15/2016   Procedure: Coronary Stent Intervention;  Surgeon: Yolonda Kida, MD;  Location: [facility];  Service: Cardiovascular;  Laterality: N/A;   CARDIAC CATHETERIZATION N/A 08/18/2016   Procedure: Right Heart Cath;  Surgeon: Jolaine Artist, MD;  Location: [facility];  Service: Cardiovascular;  Laterality: N/A;   CARDIAC CATHETERIZATION N/A 08/18/2016   Procedure: IABP Insertion;  Surgeon: Jolaine Artist, MD;  Location: [facility];  Service: Cardiovascular;  Laterality: N/A;   CARDIAC CATHETERIZATION Right 08/23/2016   Procedure: CENTRAL LINE INSERTION RIGHT SUBCLAVIAN;  Surgeon: Ivin Poot, MD;  Location: [facility];  Service: Open Heart Surgery;  Laterality: Right;   ENDOVEIN HARVEST OF GREATER SAPHENOUS VEIN Left 08/23/2016   Procedure:  ENDOVEIN HARVEST OF GREATER SAPHENOUS VEIN;  Surgeon: Ivin Poot, MD;  Location: [facility];  Service: Open Heart Surgery;  Laterality: Left;   INSERTION OF DIALYSIS CATHETER Bilateral 08/31/2016   Procedure: INSERTION OF DIALYSIS CATHETER LEFT INTERNAL JUGULAR VEIN & INSERTION OF TRIPLE LUMEN RIGHT INTERNAL JUGULAR VEIN;  Surgeon: Angelia Mould, MD;  Location: [facility];  Service:  Vascular;  Laterality: Bilateral;   INTRAOPERATIVE TRANSESOPHAGEAL ECHOCARDIOGRAM N/A 08/23/2016   Procedure: INTRAOPERATIVE TRANSESOPHAGEAL ECHOCARDIOGRAM;  Surgeon: Ivin Poot, MD;  Location: [facility];  Service: Open Heart Surgery;  Laterality: N/A;   IR GENERIC HISTORICAL  11/13/2016   IR US GUIDE VASC ACCESS LEFT 11/13/2016 Corrie Mckusick, DO [facility]   IR GENERIC HISTORICAL  11/13/2016   IR FLUORO GUIDE CV LINE LEFT 11/13/2016 Corrie Mckusick, DO [facility]   IR GENERIC HISTORICAL  11/13/2016   IR GASTROSTOMY TUBE MOD SED 11/13/2016 Corrie Mckusick, DO [facility]   MITRAL VALVE REPAIR N/A 08/23/2016   Procedure: MITRAL VALVE REPAIR (MVR) USING 25MM EDWARDS MAGNA EASE BIOPROSTHESIS MITRAL  VALVE;  Surgeon: Ivin Poot, MD;  Location: [facility];  Service: Open Heart Surgery;  Laterality: N/A;   tracheostomy reversal     TRACHEOSTOMY TUBE PLACEMENT N/A 11/09/2016   Procedure: TRACHEOSTOMY;  Surgeon: Melissa Montane, MD;  [facility];  Service: ENT;  Laterality: N/A;   VAGINAL DELIVERY     x 6     SOCIAL HISTORY:   Social History   Tobacco Use   Smoking status: Never Smoker   Smokeless tobacco: Never Used  Substance Use Topics   Alcohol use: No    FAMILY HISTORY:   Family History  Problem Relation Age of Onset   Hypertension Mother    Diabetes Mother    Breast cancer Sister     DRUG ALLERGIES:  No Known Allergies  REVIEW OF SYSTEMS:   ROS As per history of present illness. All pertinent systems were reviewed above. Constitutional,  HEENT, cardiovascular, respiratory, GI, GU, musculoskeletal, neuro, psychiatric, endocrine,  integumentary and hematologic systems were reviewed and are otherwise  negative/unremarkable except for positive findings mentioned above in the HPI.   MEDICATIONS AT HOME:   Prior to Admission medications   Medication Sig Start Date End Date Taking? Authorizing Provider  acetaminophen (TYLENOL) 325 MG tablet Take 650 mg by  mouth. 04/06/17   [provider]  albuterol (PROVENTIL) (2.5 MG/3ML) 0.083% nebulizer solution Take 3 mLs (2.5 mg total) by nebulization 4 (four) times daily - after meals and at bedtime. 11/01/16   Love, Ivan Anchors, PA-C  ALPRAZolam (XANAX) 0.25 MG tablet Take 1 tablet (0.25 mg total) by mouth 2 (two) times daily as needed for anxiety. 11/01/16   Love, Ivan Anchors, PA-C  amiodarone (PACERONE) 200 MG tablet Take 1 tablet (200 mg total) by mouth daily. 11/01/16   Love, Ivan Anchors, PA-C  apixaban (ELIQUIS) 2.5 MG TABS tablet Take 2.5 mg by mouth 2 (two) times daily.  06/22/17   [provider]  atorvastatin (LIPITOR) 80 MG tablet Take 80 mg by mouth daily at 6 PM.     [provider]  B Complex-C-Folic Acid (VOL-CARE RX PO) Take 1 tablet by mouth daily.    [provider]  Blood Glucose Monitoring Suppl (GLUCOCOM BLOOD GLUCOSE MONITOR) DEVI Use to check blood sugars 4-6 times per day Disp per insurance company kit ICD-10 E11.9 07/18/17   [provider]  carvedilol (COREG) 25 MG tablet Take 25 mg  by mouth 2 (two) times daily. 04/22/18   [provider]  clopidogrel (PLAVIX) 75 MG tablet Take 75 mg by mouth daily.    [provider]  furosemide (LASIX) 40 MG tablet Take 40 mg by mouth daily. ON mornings of dialysis    [provider]  gabapentin (NEURONTIN) 100 MG capsule Take 1 capsule (100 mg total) 2 (two) times daily by mouth. 10/16/17   Edrick Kins, DPM  gabapentin (NEURONTIN) 300 MG capsule TAKE 1 CAPSULE BY MOUTH THREE TIMES DAILY 09/18/19   Edrick Kins, DPM  insulin glargine (LANTUS) 100 UNIT/ML injection Inject 14 Units into the skin at bedtime.    [provider]  Melatonin 3 MG TABS Take 3-6 mg by mouth at bedtime.  04/06/17   [provider]  midodrine (PROAMATINE) 5 MG tablet Take 5 mg by mouth 3 (three) times a week.    [provider]  NONFORMULARY OR COMPOUNDED ITEM See pharmacy note 07/20/17    Edrick Kins, DPM  pantoprazole (PROTONIX) 40 MG tablet Take 1 tablet (40 mg total) by mouth 2 (two) times daily. 11/01/16   Love, Ivan Anchors, PA-C  polyethylene glycol (MIRALAX / GLYCOLAX) packet Take 17 g by mouth daily.    [provider]  QUEtiapine (SEROQUEL) 50 MG tablet Take 25 mg by mouth 3 (three) times daily as needed.  04/06/17   [provider]  sevelamer carbonate (RENVELA) 800 MG tablet Take 1,600 mg by mouth 3 (three) times daily with meals.    [provider]  traZODone (DESYREL) 50 MG tablet  04/04/18   [provider]      VITAL SIGNS:  Blood pressure (!) 184/95, pulse 89, temperature 99.2 F (37.3 C), temperature source Oral, resp. rate 20, height 5' 2"  (1.575 m), weight 65.8 kg, SpO2 96 %.  PHYSICAL EXAMINATION:  Physical Exam  GENERAL:  63 y.o.-year-old African-American female  patient lying in the bed with no acute distress.  EYES: Pupils equal, round, reactive to light and accommodation. No scleral icterus. Extraocular muscles intact.  HEENT: Head atraumatic, normocephalic. Oropharynx and nasopharynx clear.  NECK:  Supple, no jugular venous distention. No thyroid enlargement, no tenderness.  Tracheostomy tube in place. LUNGS: Diminished bibasilar breath sounds with bibasal and midlung zone crackles. CARDIOVASCULAR: Regular rate and rhythm, S1, S2 normal. No murmurs, rubs, or gallops.  ABDOMEN: Soft, nondistended, nontender. Bowel sounds present. No organomegaly or mass.  EXTREMITIES: No pedal edema, cyanosis, or clubbing.  NEUROLOGIC: Cranial nerves II through XII are intact. Muscle strength 5/5 in all extremities. Sensation intact. Gait not checked.  PSYCHIATRIC: The patient is alert and oriented x 3.  Normal affect and good eye contact. SKIN: No obvious rash, lesion, or ulcer.   LABORATORY PANEL:   CBC Recent Labs  Lab 10/17/19 0445  WBC 12.7*  HGB 9.4*  HCT 28.4*  PLT 250    ------------------------------------------------------------------------------------------------------------------  Chemistries  Recent Labs  Lab 10/17/19 0445  NA 140  K 3.8  CL 98  CO2 27  GLUCOSE 166*  BUN 49*  CREATININE 6.47*  CALCIUM 8.9   ------------------------------------------------------------------------------------------------------------------  Cardiac Enzymes No results for input(s): TROPONINI in the last 168 hours. ------------------------------------------------------------------------------------------------------------------  RADIOLOGY:  Dg Chest Portable 1 View  Result Date: 10/17/2019 CLINICAL DATA:  Shortness of breath. Hypoxia. Dialysis patient. EXAM: PORTABLE CHEST 1 VIEW COMPARISON:  Two-view chest x-ray 10/01/2017 FINDINGS: Chronic tracheostomy is noted. The heart is enlarged. Mild pulmonary vascular congestion is present. Bibasilar airspace disease  is new. Bilateral pleural effusions are present. There is no significant consolidation in the upper lobes. The visualized soft tissues and bony thorax are unremarkable. IMPRESSION: 1. Cardiomegaly with mild pulmonary vascular congestion and bilateral pleural effusions compatible with congestive heart failure. 2. Bibasilar airspace disease may represent atelectasis, but is concerning for bibasilar pneumonia. Electronically Signed   By: San Morelle M.D.   On: 10/17/2019 05:18      IMPRESSION AND PLAN:   1.  Acute on chronic diastolic CHF with subsequent acute hypoxic respiratory failure.  Patient's last EF was 55 to 60% in 2018.  She will be admitted to telemetry bed.  We will diurese with IV Lasix for pulmonary congestion and obtain a cardiology consultation this a.m. as well as 2D echo. I notified Dr. Ubaldo Glassing about the patient.  2.  Suspected bibasilar likely community-acquired bacterial pneumonia.  Differential diagnosis would include viral multifocal pneumonia.  The patient will be placed on IV  Rocephin and Zithromax while following blood cultures.  Will obtain sputum cultures and pneumonia antigens.  COVID-19 test is currently pending.  Mucolytic therapy will be provided.  Tracheostomy tube will be suctioned.  Respiratory therapy consult was obtained.  Trach collar may need to be replaced.  3.  End-stage renal disease on hemodialysis on Monday Wednesday Friday.  We will continue Renvela, calcitriol and Renagel as well as renal multivitamins.  Nephrology consultation will be obtained for follow-up.  I notified Dr. Juleen China about the patient  3.  Hypertensive urgency.  This may be contributing to #1.  Norvasc and ARB therapy will be continued.  We will place her on as needed IV labetalol.  4.  Dyslipidemia.  Statin therapy will be resumed.  5.  Type 2 diabetes mellitus.  The patient will be placed on supplemental coverage with NovoLog.  We will continue her basal coverage.  6.  GERD.  PPI therapy will be resumed.  7.  Paroxysmal atrial flutter.  Will check EKG  8.  DVT prophylaxis.  Subcu heparin.  All the records are reviewed and case discussed with ED provider. The plan of care was discussed in details with the patient (and son). I answered all questions. The patient agreed to proceed with the above mentioned plan. Further management will depend upon hospital course.   CODE STATUS: Full code  TOTAL TIME TAKING CARE OF THIS PATIENT: 55 minutes.    Christel Mormon M.D on 10/17/2019 at [facility]   From 7 PM-7 AM, contact night-coverage [email or web address]  CC: Primary care physician; System, Pcp Not In   Note: This dictation was prepared with Dragon dictation along with smaller phrase technology. Any transcriptional errors that result from this process are unintentional.

## 2019-10-17 NOTE — Progress Notes (Signed)
Post HD Note  Pt tolerated the Tx for 3.5 hrs however, BP dropped during Tx and UF was tuned off. Nitro infusion was stopped primary rn was notified and administered Midodrine accordingly. Pt experienced severe cramps in on her left leg. BFR was reduced and UF was turned off to manage symptoms. Prescribed UF of 3500 was not met. Actual UF is 2942m. Nephrologist is notified. Pt Access is patent WDL post Tx. Pt had several productive coughs and 1 incident of emesis.   10/17/19 1545  Hand-Off documentation  Report given to (Full Name) AAlwyn RenRN   Report received from (Full Name) DNewt MinionRN   Vital Signs  Temp 97.7 F (36.5 C)  Temp Source Oral  Pulse Rate 82  Pulse Rate Source Monitor  Resp 20  BP 107/60  BP Location Right Arm  BP Method Automatic  Patient Position (if appropriate) Lying  Oxygen Therapy  SpO2 94 %  O2 Device Tracheostomy Collar  O2 Flow Rate (L/min) 13 L/min  Pain Assessment  Pain Scale 0-10  Pain Score 0  Post-Hemodialysis Assessment  Rinseback Volume (mL) 250 mL  KECN 76.2 V  Dialyzer Clearance Lightly streaked  Duration of HD Treatment -hour(s) 3.5 hour(s)  Hemodialysis Intake (mL) 500 mL  UF Total -Machine (mL) 2910 mL  Net UF (mL) 2410 mL  Tolerated HD Treatment Yes  AVG/AVF Arterial Site Held (minutes) 8 minutes  AVG/AVF Venous Site Held (minutes) 10 minutes  Fistula / Graft Left Upper arm  Placement Date: 10/02/16   Placed prior to admission: No  Orientation: Left  Access Location: Upper arm  Site Condition No complications  Fistula / Graft Assessment Present;Absent ;Thrill  Status Deaccessed  Needle Size 15  Drainage Description None

## 2019-10-17 NOTE — ED Notes (Signed)
RT called for in-line trach suction d/t copious secretions.

## 2019-10-17 NOTE — ED Notes (Signed)
Respiratory called to come suction pt trach, states they will come down now.

## 2019-10-17 NOTE — Progress Notes (Signed)
Deborah [name]  is a 63 y.o. African-American female with a known history of type II arrhythmias, hypertension, dyslipidemia and end-stage renal disease on hemodialysis on Monday Wednesday Friday, who presented to the emergency room with acute onset of worsening dyspnea and hypoxia with a pulse ox entry of 81% on room air.  Patient is status post tracheostomy but is not on home O2.  She has been having dry cough here occasionally productive of clear sputum at home.  She admitted to orthopnea and paroxysmal nocturnal dyspnea.  She had no fever or chills.  No worsening lower extremity edema.  No headache or dizziness or blurred vision.  No nausea vomiting or abdominal pain.  She did not miss any hemodialysis session.  Upon presentation to the emergency room, blood pressure was 184/95 and later 200/89 with pulse currently of 96% on her percent nonrebreather and a temperature of 99.2.  Labs revealed mild cytosis with WBC of 12.7 and neutrophilia.  ANC/ALC ratio was 9.7/1.4.  BNP was 1350 and high-sensitivity troponin I of 37.  CMP showed BUN of 49 creatinine 6.47.  Blood cultures were ordered.  The patient was given IV cefepime and vancomycin as well as IV nitroglycerin drip.  She will be admitted to a telemetry bed for further evaluation and management.  Patient was seen and examined at her bedside in the ED.  Reports dyspnea with minimal exertion worsened with ambulation.  Also reports pleuritic pain with nonproductive cough.  COVID-19 test in process.  Plan for HD today.  Last HD was on Wednesday, 10/15/2019.  Independently viewed chest x-ray done on admission which showed cardiomegaly with increase in pulmonary vascularity and bilateral pleural effusions.  BNP elevated greater than 1300.  Last 2D echo done in 2018 showed normal LVEF 55 to 60%.  A repeat TTE is pending.  Please refer to H&P dictated by my partner Dr. Sidney Ace, on 10/17/2019 for further details of the assessment and plan.

## 2019-10-17 NOTE — Progress Notes (Signed)
Post HD Assessment   10/17/19 1558  Neurological  Level of Consciousness Alert  Orientation Level Oriented X4  Respiratory  Respiratory Pattern Regular;Accessory muscle use  Chest Assessment Chest expansion symmetrical  Bilateral Breath Sounds Coarse crackles  Cardiac  Pulse Regular  Heart Sounds S1, S2  Jugular Venous Distention (JVD) No  ECG Monitor Yes  Antiarrhythmic device No  Vascular  R Radial Pulse +2  L Radial Pulse +2  Integumentary  Integumentary (WDL) WDL  Musculoskeletal  Musculoskeletal (WDL) X  Generalized Weakness Yes  Gastrointestinal  Bowel Sounds Assessment Hypoactive  GU Assessment  Genitourinary (WDL) X  Genitourinary Symptoms Oliguria (HD Pt)  Psychosocial  Psychosocial (WDL) WDL

## 2019-10-17 NOTE — ED Notes (Signed)
Attending MD notified that nitro drip remains stopped and current BP is 91/57. Order received to d/c nitro drip.

## 2019-10-17 NOTE — Progress Notes (Signed)
HD Tx Started    10/17/19 1200  Vital Signs  Pulse Rate 79  BP (!) 161/85  Oxygen Therapy  SpO2 98 %  O2 Device Tracheostomy Collar  Pain Assessment  Pain Scale 0-10  Pain Score 0  During Hemodialysis Assessment  Blood Flow Rate (mL/min) 400 mL/min  Arterial Pressure (mmHg) -120 mmHg  Venous Pressure (mmHg) 170 mmHg  Transmembrane Pressure (mmHg) 60 mmHg  Ultrafiltration Rate (mL/min) 1000 mL/min  Dialysate Flow Rate (mL/min) 600 ml/min  Conductivity: Machine  14  HD Safety Checks Performed Yes  Dialysis Fluid Bolus Normal Saline  Bolus Amount (mL) 250 mL  Intra-Hemodialysis Comments Tx initiated

## 2019-10-17 NOTE — Plan of Care (Signed)
  Problem: Fluid Volume: Goal: Compliance with measures to maintain balanced fluid volume will improve Outcome: Progressing   Problem: Clinical Measurements: Goal: Complications related to the disease process, condition or treatment will be avoided or minimized Outcome: Progressing  Pt is currently receiving HD Tx at [facility]. Pt is on room air tracheostomy collar with 13L O2. Pt blood cultures from Dialysis line, as per nephrologist order, has been drawn and sent. Pt received Respiratory breathing tx and suctioning. Pt is currently receiving Ceftriaxone 2grm  over 15 min and continuous Nitro 43mcg/min. Will keep monitoring

## 2019-10-18 ENCOUNTER — Inpatient Hospital Stay (HOSPITAL_COMMUNITY)
Admit: 2019-10-18 | Discharge: 2019-10-18 | Disposition: A | Discharge status: Home / Self Care | Payer: Medicare Other | Attending: Family Medicine | Admitting: Family Medicine

## 2019-10-18 DIAGNOSIS — I371 Nonrheumatic pulmonary valve insufficiency: Secondary | ICD-10-CM

## 2019-10-18 DIAGNOSIS — I361 Nonrheumatic tricuspid (valve) insufficiency: Secondary | ICD-10-CM

## 2019-10-18 DIAGNOSIS — I509 Heart failure, unspecified: Secondary | ICD-10-CM

## 2019-10-18 LAB — BASIC METABOLIC PANEL
Anion gap: 11 (ref 5–15)
BUN: 28 mg/dL — ABNORMAL HIGH (ref 8–23)
CO2: 31 mmol/L (ref 22–32)
Calcium: 8.7 mg/dL — ABNORMAL LOW (ref 8.9–10.3)
Chloride: 96 mmol/L — ABNORMAL LOW (ref 98–111)
Creatinine, Ser: 5.34 mg/dL — ABNORMAL HIGH (ref 0.44–1.00)
GFR calc Af Amer: 9 mL/min — ABNORMAL LOW (ref >60)
GFR calc non Af Amer: 8 mL/min — ABNORMAL LOW (ref >60)
Glucose, Bld: 179 mg/dL — ABNORMAL HIGH (ref 70–99)
Potassium: 4.2 mmol/L (ref 3.5–5.1)
Sodium: 138 mmol/L (ref 135–145)

## 2019-10-18 LAB — CBC
HCT: 26.4 % — ABNORMAL LOW (ref 36.0–46.0)
Hemoglobin: 9 g/dL — ABNORMAL LOW (ref 12.0–15.0)
MCH: 29.8 pg (ref 26.0–34.0)
MCHC: 34.1 g/dL (ref 30.0–36.0)
MCV: 87.4 fL (ref 80.0–100.0)
Platelets: 236 10*3/uL (ref 150–400)
RBC: 3.02 MIL/uL — ABNORMAL LOW (ref 3.87–5.11)
RDW: 15.8 % — ABNORMAL HIGH (ref 11.5–15.5)
WBC: 12.6 10*3/uL — ABNORMAL HIGH (ref 4.0–10.5)
nRBC: 0 % (ref 0.0–0.2)

## 2019-10-18 LAB — BRAIN NATRIURETIC PEPTIDE: B Natriuretic Peptide: 796 pg/mL — ABNORMAL HIGH (ref 0.0–100.0)

## 2019-10-18 LAB — GLUCOSE, CAPILLARY
Glucose-Capillary: 172 mg/dL — ABNORMAL HIGH (ref 70–99)
Glucose-Capillary: 240 mg/dL — ABNORMAL HIGH (ref 70–99)
Glucose-Capillary: 201 mg/dL — ABNORMAL HIGH (ref 70–99)
Glucose-Capillary: 208 mg/dL — ABNORMAL HIGH (ref 70–99)

## 2019-10-18 LAB — ECHOCARDIOGRAM COMPLETE
Height: 62 in
Weight: 2220.8 oz

## 2019-10-18 MED ORDER — OXYCODONE HCL 5 MG PO TABS: 5.0000 mg | ORAL_TABLET | Freq: Four times a day (QID) | ORAL | Status: DC | PRN

## 2019-10-18 MED ORDER — SENNOSIDES-DOCUSATE SODIUM 8.6-50 MG PO TABS
2.0000 | ORAL_TABLET | Freq: Every day | ORAL | Status: DC
  Administered 2019-10-22: 2 via ORAL
  Filled 2019-10-18 (×5): qty 2

## 2019-10-18 NOTE — TOC Initial Note (Signed)
Transition of Care [facility]) - Initial/Assessment Note    Patient Details  Name: Deborah [name] MRN: [id number] Date of Birth: Jun 20, 1956  Transition of Care [facility]) CM/SW Contact:    Latanya Maudlin, RN Phone Number: 10/18/2019, 9:16 AM  Clinical Narrative:  TOC consulted to complete high risk readmission assessment. Patient is currently receiving HD and unable to complete full TOC team assessment. Most information obtained from chart review and communication with bedside staff.  Patient lives in an apartment with son. All MD's are at [facility]. Patient is HD MWF at [facility]. Uses [facility].                Has use [facility]" through [facility] in the past. Has chronic trache. Was not requiring O2 prior to admission but per Resp patient has used HF oxygen here and has required suctioning. Per last RNCM documentation patient had medicaid provided Aide services multiple times per week.  TOC will continue to follow and assist with disposition as needed.   Expected Discharge Plan: Home/Self Care Barriers to Discharge: Continued Medical Work up   Patient Goals and CMS Choice        Expected Discharge Plan and Services Expected Discharge Plan: Home/Self Care   Discharge Planning Services: CM Consult                                          Prior Living Arrangements/Services                       Activities of Daily Living Home Assistive Devices/Equipment: Nebulizer, Radio producer (specify quad or straight), Vent/Trach supplies ADL Screening (condition at time of admission) Patient's cognitive ability adequate to safely complete daily activities?: Yes Is the patient deaf or have difficulty hearing?: No Does the patient have difficulty seeing, even when wearing glasses/contacts?: No Does the patient have difficulty concentrating, remembering, or making decisions?: No Patient able to express need for assistance with ADLs?: Yes Does the patient have difficulty dressing or bathing?:  No Independently performs ADLs?: Yes (appropriate for developmental age) Does the patient have difficulty walking or climbing stairs?: No Weakness of Legs: None Weakness of Arms/Hands: None  Permission Sought/Granted                  Emotional Assessment              Admission diagnosis:  Acute respiratory failure with hypoxia ([place]) [J96.01] ESRD on hemodialysis ([place]) [N18.6, Z99.2] HCAP (healthcare-associated pneumonia) [J18.9] Tracheostomy tube present ([place]) [Z93.0] Patient Active Problem List   Diagnosis Date Noted  . Acute CHF (congestive heart failure) ([place]) 10/17/2019  . Diabetic neuropathy ([place]) 10/16/2017  . Tracheobronchitis 10/11/2017  . Hemorrhage from arteriovenous dialysis graft ([place]) 10/05/2017  . Sepsis ([place]) 09/08/2017  . Adjustment disorder with anxious mood 12/09/2016  . Bacteremia due to Staphylococcus aureus   . Staphylococcus aureus bacteremia with sepsis ([place])   . Uncontrolled type 2 diabetes mellitus with complication ([place])   . ST elevation myocardial infarction involving left main coronary artery ([place])   . Palliative care encounter   . Advance care planning   . Vocal cord dysfunction   . Palliative care by specialist   . Goals of care, counseling/discussion   . Bacteremia   . End-stage renal disease on hemodialysis ([place])   . Paroxysmal atrial fibrillation ([name])   . Heart attack (  [place])   . Uncontrolled type 2 diabetes mellitus with complication, without long-term current use of insulin ([place])   . Staphylococcus aureus bacteremia 11/12/2016  . Vocal cord paralysis   . Respiratory distress 11/02/2016  . Chronic anticoagulation   . Acute lower UTI   . Labile blood glucose   . Leukocytosis   . Subtherapeutic international normalized ratio (INR)   . Supratherapeutic INR   . Cough   . Oropharyngeal dysphagia   . Uncontrolled type 2 diabetes mellitus with complication, with long-term current use of insulin ([place])   . Physical debility  10/11/2016  . Metabolic encephalopathy [id number]  . Debility   . ESRD on dialysis ([place])   . Type 2 diabetes mellitus with complication, with long-term current use of insulin ([place])   . Anemia of chronic disease   . Coronary artery disease involving native coronary artery of native heart without angina pectoris   . Acute on chronic respiratory failure with hypoxemia ([place])   . Atrial flutter ([place])   . Persistent cough   . Dysphonia   . Sleep disturbance   . Diabetes mellitus type 2 in nonobese ([name])   . Stage 3 chronic kidney disease   . NSTEMI (non-ST elevated myocardial infarction) ([place])   . Tachypnea   . Hyponatremia   . Lymphocytosis   . Acute blood loss anemia   . Pressure injury of skin 09/18/2016  . History of tracheostomy   . Delirium   . Encounter for attention to tracheostomy ([place])   . History of MVR with cardiopulmonary bypass   . Systolic congestive heart failure ([place])   . HCAP (healthcare-associated pneumonia)   . PAF (paroxysmal atrial fibrillation) ([place])   . Acute systolic congestive heart failure ([place])   . AKI (acute kidney injury) ([place])   . Abnormal LFTs   . S/P MVR (mitral valve replacement)   . Mitral regurgitation 08/23/2016  . Acute on chronic diastolic heart failure ([place]) 08/18/2016  . Acute pulmonary edema ([name])   . Respiratory failure ([place]) 08/17/2016  . Cardiogenic shock ([place]) 08/17/2016  . Acute hypoxemic respiratory failure ([place])   . STEMI (ST elevation myocardial infarction) ([place]) 08/15/2016  . Noncompliance 01/25/2015  . Bereavement 07/09/2014  . Low back pain 02/17/2014  . Cervical cancer screening 11/20/2013  . Routine general medical examination at a health care facility 10/14/2013  . Chronic kidney disease, stage 3 06/14/2012  . Hypertension 03/08/2012  . Diabetes mellitus type 2, controlled 03/08/2012  . Hyperlipidemia 03/08/2012   PCP:  System, Pcp Not In Pharmacy:   [facility]  [address] [place] [address] Phone: [phone] Fax: [phone]  [place], [place] - [address] [facility]. [address] [place] [address] Phone: [phone] Fax: [place] [address] [place] [address] Phone: [phone] Fax: [place] [address], [place] [address] [place] [address] Phone: [phone] Fax: [phone]     Social Determinants of Health (SDOH) Interventions    Readmission Risk Interventions Readmission Risk Prevention Plan 10/18/2019  Transportation Screening Complete  PCP or Specialist appointment within 3-5 days of discharge [place] Not Applicable  Some recent data might be hidden

## 2019-10-18 NOTE — Progress Notes (Signed)
Trach care complete. Inner cannula changed. Small amount of brown/blood tinged secretions noted on inner cannula and during suctioning. Trach checklist and equipment at bedside. Patient tolerated suctioning and inner cannula change, well.

## 2019-10-18 NOTE — Progress Notes (Signed)
Patient Name: Deborah [name] Date of Encounter: 10/18/2019  Hospital Problem List     Active Problems:   Acute CHF (congestive heart failure) [facility])    Patient Profile     63 y.o. female with history of end-stage renal disease on hemodialysis, awaiting renal transplant, hypertension, cad s/p pci/cabg, history of valve replacement in the mitral position with a bioprosthetic device, history of paroxysmal atrial fibrillation,, history of tracheostomy who presented to emergency room with acute onset of worsening shortness of breath and noted to have hypoxia with a pulse oximetry of 81% on room air.CXR revealed cardiomegaly with pulmonary vascular congestion.  Subjective   Doing better this morning.  Less short of breath.  BNP decreased to 796 from 1350 yesterday.  Inpatient Medications    . albuterol  2.5 mg Nebulization TID PC & HS  . amiodarone  200 mg Oral Daily  . aspirin EC  81 mg Oral Daily  . atorvastatin  80 mg Oral q1800  . carvedilol  25 mg Oral BID  . Chlorhexidine Gluconate Cloth  6 each Topical Q0600  . epoetin (EPOGEN/PROCRIT) injection  10,000 Units Intravenous Q M,W,F-HD  . furosemide  40 mg Intravenous Q12H  . gabapentin  100 mg Oral BID  . guaiFENesin  600 mg Oral BID  . heparin injection (subcutaneous)  5,000 Units Subcutaneous Q12H  . insulin aspart  0-9 Units Subcutaneous TID WC  . insulin glargine  14 Units Subcutaneous QHS  . Melatonin  5 mg Oral QHS  . midodrine  5 mg Oral Once per day on Mon Wed Fri  . pantoprazole  40 mg Oral BID  . polyethylene glycol  17 g Oral Daily  . senna-docusate  2 tablet Oral QHS  . sevelamer carbonate  1,600 mg Oral TID WC  . sodium chloride flush  3 mL Intravenous Q12H    Vital Signs    Vitals:   10/17/19 2301 10/18/19 0450 10/18/19 0731 10/18/19 0807  BP:  (!) 144/71 (!) 181/78   Pulse:  66 70 75  Resp:  20 18 18   Temp:  98.6 F (37 C) 99 F (37.2 C)   TempSrc:  Oral Oral   SpO2: 94% 97% 97% 97%  Weight:  63 kg     Height:        Intake/Output Summary (Last 24 hours) at 10/18/2019 1022 Last data filed at 10/18/2019 0052 Gross per 24 hour  Intake 103 ml  Output 2410 ml  Net -2307 ml   Filed Weights   10/17/19 0433 10/17/19 1145 10/18/19 0450  Weight: 65.8 kg 65.7 kg 63 kg    Physical Exam    GEN: Well nourished, well developed, in no acute distress.  HEENT: normal.  Neck: Supple, no JVD, carotid bruits, or masses. Cardiac: RRR, no murmurs, rubs, or gallops. No clubbing, cyanosis, edema.  Radials/DP/PT 2+ and equal bilaterally.  Respiratory:  Respirations regular and unlabored, clear to auscultation bilaterally. GI: Soft, nontender, nondistended, BS + x 4. MS: no deformity or atrophy. Skin: warm and dry, no rash. Neuro:  Strength and sensation are intact. Psych: Normal affect.  Labs    CBC Recent Labs    10/17/19 0445 10/18/19 0537  WBC 12.7* 12.6*  NEUTROABS 9.7*  --   HGB 9.4* 9.0*  HCT 28.4* 26.4*  MCV 89.3 87.4  PLT 250 [id number]   Basic Metabolic Panel Recent Labs    10/17/19 0445 10/18/19 0517  NA 140 138  K 3.8 4.2  CL  98 96*  CO2 27 31  GLUCOSE 166* 179*  BUN 49* 28*  CREATININE 6.47* 5.34*  CALCIUM 8.9 8.7*  MG 1.8  --    Liver Function Tests No results for input(s): AST, ALT, ALKPHOS, BILITOT, PROT, ALBUMIN in the last 72 hours. No results for input(s): LIPASE, AMYLASE in the last 72 hours. Cardiac Enzymes No results for input(s): CKTOTAL, CKMB, CKMBINDEX, TROPONINI in the last 72 hours. BNP Recent Labs    10/17/19 0445 10/18/19 0517  BNP 1,350.0* 796.0*   D-Dimer No results for input(s): DDIMER in the last 72 hours. Hemoglobin A1C Recent Labs    10/17/19 0907  HGBA1C 7.4*   Fasting Lipid Panel Recent Labs    10/17/19 0445  TRIG 83   Thyroid Function Tests Recent Labs    10/17/19 0445  TSH 3.105    Telemetry    Normal sinus rhythm at a rate of 70.  ECG    Normal sinus rhythm with no ischemia  Radiology    Dg Chest Portable  1 View  Result Date: 10/17/2019 CLINICAL DATA:  Shortness of breath. Hypoxia. Dialysis patient. EXAM: PORTABLE CHEST 1 VIEW COMPARISON:  Two-view chest x-ray 10/01/2017 FINDINGS: Chronic tracheostomy is noted. The heart is enlarged. Mild pulmonary vascular congestion is present. Bibasilar airspace disease is new. Bilateral pleural effusions are present. There is no significant consolidation in the upper lobes. The visualized soft tissues and bony thorax are unremarkable. IMPRESSION: 1. Cardiomegaly with mild pulmonary vascular congestion and bilateral pleural effusions compatible with congestive heart failure. 2. Bibasilar airspace disease may represent atelectasis, but is concerning for bibasilar pneumonia. Electronically Signed   By: San Morelle M.D.   On: 10/17/2019 05:18    Assessment & Plan    63 y.o. female with history of end-stage renal disease on hemodialysis, awaiting renal transplant, hypertension, cad s/p pci/cabg, history of valve replacement in the mitral position with a bioprosthetic device, history of paroxysmal atrial fibrillation,, history of tracheostomy who presented to emergency room with acute onset of worsening shortness of breath and noted to have hypoxia with a pulse oximetry of 81% on room air.CXR revealed cardiomegaly with pulmonary vascular congestion. Improved after hemodialysis.  Complains of right arm pain at IV site.  No obvious infiltrate.  CHF-has HFpEF with ef normal ef [facility].Will attempt to control with iv lasix 40 bid.  Carvedilol 25 mg bid.  Echo is pending.  BNP is improved.  Will follow.   Paroxysmal afib-is on apixiban 2.5 mg bid and amiodarone 200 mg daily. Currently in nsr. Will continue with this regimen.   Cad/mitral valve disease. -Had ruptured posterior papillar Next y muscle with cardiogenic shock in 2017 . Had inferior posterior stemi in 2013 with a des to [place]. Underwent emergent mvr with a tissue prosthesis  due to ruptured posterior papillary muscle. Pst op course complicated by ckd and prolonged intubation requiring trach. Currently appears stable from ischemic [place]. Will review echo when available to evaluate lv funciton and valves. Conitnue with current outpatient regimen for now. Includes asa 81 mg daily and atorvastatin at 80 mg daily.   HTN-currently controlled after hd. Will reuire midodrine during hd. Continue off of iv ntg for now. Continue with carvedilol at 25 mg dialy and follow.   Hyperlipidemia-conitnue with atorvastatin.   DM-insultin as is being done.    Signed, Javier Docker Sahvanna Mcmanigal MD 10/18/2019, 10:22 AM  Pager: [phone]

## 2019-10-18 NOTE — Progress Notes (Signed)
[facility]  ROUNDING NOTE   Subjective:   Hemodialysis treatment yesterday. UF of 2.4 liters.  Tolerated treatment well.   Son at bedside.   Objective:  Vital signs in last 24 hours:  Temp:  [97.6 F (36.4 C)-100.6 F (38.1 C)] 99 F (37.2 C) (11/14 0731) Pulse Rate:  [64-93] 75 (11/14 0807) Resp:  [16-20] 18 (11/14 0807) BP: (77-181)/(44-127) 181/78 (11/14 0731) SpO2:  [91 %-100 %] 97 % (11/14 0807) FiO2 (%):  [35 %-40 %] 40 % (11/14 0807) Weight:  [63 kg-65.7 kg] 63 kg (11/14 0450)  Weight change: -0.071 kg Filed Weights   10/17/19 0433 10/17/19 1145 10/18/19 0450  Weight: 65.8 kg 65.7 kg 63 kg    Intake/Output: I/O last 3 completed shifts: In: 503 [I.V.:3; IV Piggyback:500] Out: [id number] [Other:2410]   Intake/Output this shift:  No intake/output data recorded.  Physical Exam: General: Sitting up in stretch  Head: Normocephalic, atraumatic. Moist oral mucosal membranes  Eyes: Anicteric, PERRL  Neck: +tracheostomy  Lungs:  Coarse bilateral breath sounds.   Heart: Regular rate and rhythm  Abdomen:  Soft, nontender  Extremities:  trace peripheral edema.  Neurologic: Nonfocal, moving all four extremities  Skin: No lesions  Access: Left AVG    Basic Metabolic Panel: Recent Labs  Lab 10/17/19 0445 10/18/19 0517  NA 140 138  K 3.8 4.2  CL 98 96*  CO2 27 31  GLUCOSE 166* 179*  BUN 49* 28*  CREATININE 6.47* 5.34*  CALCIUM 8.9 8.7*  MG 1.8  --     Liver Function Tests: No results for input(s): AST, ALT, ALKPHOS, BILITOT, PROT, ALBUMIN in the last 168 hours. No results for input(s): LIPASE, AMYLASE in the last 168 hours. No results for input(s): AMMONIA in the last 168 hours.  CBC: Recent Labs  Lab 10/17/19 0445 10/18/19 0537  WBC 12.7* 12.6*  NEUTROABS 9.7*  --   HGB 9.4* 9.0*  HCT 28.4* 26.4*  MCV 89.3 87.4  PLT 250 236    Cardiac Enzymes: No results for input(s): CKTOTAL, CKMB, CKMBINDEX, TROPONINI in the last 168  hours.  BNP: Invalid input(s): POCBNP  CBG: Recent Labs  Lab 10/17/19 1356 10/17/19 1455 10/17/19 1903 10/17/19 2048 10/18/19 0733  GLUCAP 101* 94 148* 225* 172*    Microbiology: Results for orders placed or performed during the hospital encounter of 10/17/19  SARS CORONAVIRUS 2 (TAT 6-24 HRS) Nasopharyngeal Nasopharyngeal Swab     Status: None   Collection Time: 10/17/19  4:45 AM   Specimen: Nasopharyngeal Swab  Result Value Ref Range Status   SARS Coronavirus 2 NEGATIVE NEGATIVE Final    Comment: (NOTE) SARS-CoV-2 target nucleic acids are NOT DETECTED. The SARS-CoV-2 RNA is generally detectable in upper and lower respiratory specimens during the acute phase of infection. Negative results do not preclude SARS-CoV-2 infection, do not rule out co-infections with other pathogens, and should not be used as the sole basis for treatment or other patient management decisions. Negative results must be combined with clinical observations, patient history, and epidemiological information. The expected result is Negative. Fact Sheet for Patients: SugarRoll.be Fact Sheet for Healthcare Providers: https://www.woods-mathews.com/ This test is not yet approved or cleared by the [place] FDA and  has been authorized for detection and/or diagnosis of SARS-CoV-2 by FDA under an Emergency Use Authorization (EUA). This EUA will remain  in effect (meaning this test can be used) for the duration of the COVID-19 declaration under Section 56 4(b)(1) of the Act, 21 U.S.C. section  360bbb-3(b)(1), unless the authorization is terminated or revoked sooner. Performed at [facility], [place] [address] [place], [place] [address]   Culture, respiratory     Status: None (Preliminary result)   Collection Time: 10/17/19 12:42 PM   Specimen: Tracheal Aspirate  Result Value Ref Range Status   Specimen Description   Final    TRACHEAL ASPIRATE Performed  at [facility], [address], [place], [place] [address]    Special Requests   Final    NONE Performed at [facility], [place], [place] [address]    Gram Stain   Final    ABUNDANT WBC PRESENT, PREDOMINANTLY PMN ABUNDANT GRAM POSITIVE RODS FEW GRAM POSITIVE COCCI Performed at [facility], [place] [address], [place], [place] [address]    Culture PENDING  Incomplete   Report Status PENDING  Incomplete  CULTURE, BLOOD (ROUTINE X 2) w Reflex to ID Panel     Status: None (Preliminary result)   Collection Time: 10/17/19 12:50 PM   Specimen: BLOOD  Result Value Ref Range Status   Specimen Description BLOOD PORTA CATH  Final   Special Requests   Final    BOTTLES DRAWN AEROBIC AND ANAEROBIC Blood Culture results may not be optimal due to an excessive volume of blood received in culture bottles   Culture   Final    NO GROWTH < 24 HOURS Performed at [facility], [address], [place], [place] [address]    Report Status PENDING  Incomplete  CULTURE, BLOOD (ROUTINE X 2) w Reflex to ID Panel     Status: None (Preliminary result)   Collection Time: 10/17/19  2:20 PM   Specimen: BLOOD  Result Value Ref Range Status   Specimen Description BLOOD RT HAND  Final   Special Requests   Final    BOTTLES DRAWN AEROBIC AND ANAEROBIC Blood Culture results may not be optimal due to an inadequate volume of blood received in culture bottles   Culture   Final    NO GROWTH < 24 HOURS Performed at [facility], [address], [place], [place] [address]    Report Status PENDING  Incomplete  MRSA PCR Screening     Status: None   Collection Time: 10/17/19  9:50 PM   Specimen: Nasopharyngeal  Result Value Ref Range Status   MRSA by PCR NEGATIVE NEGATIVE Final    Comment:        The GeneXpert MRSA Assay (FDA approved for NASAL specimens only), is one component of a comprehensive MRSA colonization surveillance program. It is not intended to  diagnose MRSA infection nor to guide or monitor treatment for MRSA infections. Performed at [facility], [address] [place], [place] [address]     Coagulation Studies: No results for input(s): LABPROT, INR in the last 72 hours.  Urinalysis: No results for input(s): COLORURINE, LABSPEC, PHURINE, GLUCOSEU, HGBUR, BILIRUBINUR, KETONESUR, PROTEINUR, UROBILINOGEN, NITRITE, LEUKOCYTESUR in the last 72 hours.  Invalid input(s): APPERANCEUR    Imaging: Dg Chest Portable 1 View  Result Date: 10/17/2019 CLINICAL DATA:  Shortness of breath. Hypoxia. Dialysis patient. EXAM: PORTABLE CHEST 1 VIEW COMPARISON:  Two-view chest x-ray 10/01/2017 FINDINGS: Chronic tracheostomy is noted. The heart is enlarged. Mild pulmonary vascular congestion is present. Bibasilar airspace disease is new. Bilateral pleural effusions are present. There is no significant consolidation in the upper lobes. The visualized soft tissues and bony thorax are unremarkable. IMPRESSION: 1. Cardiomegaly with mild pulmonary vascular  congestion and bilateral pleural effusions compatible with congestive heart failure. 2. Bibasilar airspace disease may represent atelectasis, but is concerning for bibasilar pneumonia. Electronically Signed   By: San Morelle M.D.   On: 10/17/2019 05:18     Medications:   . sodium chloride 250 mL (10/18/19 0621)  . azithromycin 500 mg (10/18/19 0622)  . cefTRIAXone (ROCEPHIN)  IV Stopped (10/17/19 1332)   . albuterol  2.5 mg Nebulization TID PC & HS  . amiodarone  200 mg Oral Daily  . aspirin EC  81 mg Oral Daily  . atorvastatin  80 mg Oral q1800  . carvedilol  25 mg Oral BID  . Chlorhexidine Gluconate Cloth  6 each Topical Q0600  . epoetin (EPOGEN/PROCRIT) injection  10,000 Units Intravenous Q M,W,F-HD  . furosemide  40 mg Intravenous Q12H  . gabapentin  100 mg Oral BID  . guaiFENesin  600 mg Oral BID  . heparin injection (subcutaneous)  5,000 Units Subcutaneous Q12H  .  insulin aspart  0-9 Units Subcutaneous TID WC  . insulin glargine  14 Units Subcutaneous QHS  . Melatonin  5 mg Oral QHS  . midodrine  5 mg Oral Once per day on Mon Wed Fri  . pantoprazole  40 mg Oral BID  . polyethylene glycol  17 g Oral Daily  . sevelamer carbonate  1,600 mg Oral TID WC  . sodium chloride flush  3 mL Intravenous Q12H   sodium chloride, acetaminophen, ALPRAZolam, chlorpheniramine-HYDROcodone, labetalol, labetalol, ondansetron (ZOFRAN) IV, QUEtiapine, sodium chloride flush, zolpidem  Assessment/ Plan:  [name] is a 63 y.o. black female with end stage renal disease on hemodialysis, tracheostomy, hypotension, diabetes mellitus type II insulin dependent, diabetic neuropathy,  coronary artery disease, mitral valve repair, atrial flutter on apixaban  [facility] MWF [facility] left AVG Chronic tracheostomy  1. End Stage Renal Disease: with pulmonary edema - emergent dialysis on admission - Continue MWF schedule  2. Respiratory failure: requiring oxygen. COVID-19 negative Status post tracheostomy - empiric antibiotics - emergent hemodialysis treatment as above.   3. Hypertension: usually requires midodrine before dialysis. However with elevated blood pressure on encounter. Will hold midodrine before dialysis.  4. Anemia of chronic kidney disease:   - EPO with HD treatments.  5. Secondary Hyperparathyroidism: - sevelamer   LOS: 1 Deborah [name] 11/14/20208:26 AM

## 2019-10-18 NOTE — Progress Notes (Signed)
*  PRELIMINARY RESULTS* Echocardiogram 2D Echocardiogram has been performed.  Deborah [name] Taffy Delconte 10/18/2019, 12:36 PM

## 2019-10-18 NOTE — Progress Notes (Signed)
PROGRESS NOTE  Deborah [name] [id number] DOB: 1956/07/04 DOA: 10/17/2019 PCP: System, Pcp Not In  HPI/Recap of past 24 hours: Deborah [name] a63 y.o.African-American femalewith a known history of type II arrhythmias, hypertension, dyslipidemia and end-stage renal disease on hemodialysis on Monday Wednesday Friday, who presented to the emergency room with acute onset of worsening dyspnea and hypoxia with a pulse ox entry of 81% on room air. Patient is status post tracheostomy but is not on home O2. She has been having dry cough here occasionally productive of clear sputum at home. She admitted to orthopnea and paroxysmal nocturnal dyspnea. She had no fever or chills.No worsening lower extremity edema. No headache or dizziness or blurred vision. No nausea vomiting or abdominal pain. She did not miss any hemodialysis session.  Upon presentation to the emergency room, blood pressure was 184/95and later 200/89with pulse currently of 96% on her percent nonrebreather and a temperature of 99.2. Labs revealed mild cytosis with WBC of 12.7 and neutrophilia. ANC/ALC ratio was 9.7/1.4. BNP was 1350 and high-sensitivity troponin I of 37. CMP showed BUN of 49 creatinine 6.47. Blood cultures were ordered.  The patient was given IV cefepime and vancomycin as well as IV nitroglycerin drip. She will be admitted to a telemetry bed for further evaluation and management.  Patient was seen and examined at her bedside in the ED.  Reports dyspnea with minimal exertion worsened with ambulation.  Also reports pleuritic pain with nonproductive cough.  COVID-19 test in process.  Plan for HD today.  Last HD was on Wednesday, 10/15/2019.  Independently viewed chest x-ray done on admission which showed cardiomegaly with increase in pulmonary vascularity and bilateral pleural effusions.  BNP elevated greater than 1300.  Last 2D echo done in 2018 showed normal LVEF 55 to 60%.  A repeat TTE is pending.   10/18/19: Patient was seen and examined at bedside this morning.  States she feels better after hemodialysis.  She denies dyspnea..  No anginal symptoms.  Assessment/Plan: Active Problems:   Acute CHF (congestive heart failure) ([name])  Acute on chronic diastolic CHF with subsequent acute hypoxic respiratory failure.  Patient's last EF was 55 to 60% in 2018.  Hemodialyzed on 10/17/2019 with improvement of symptomatology. Seen by cardiology, following. Medications as recommended by cardiology.  Suspected bibasilar likely community-acquired bacterial pneumonia.  Differential diagnosis would include viral multifocal pneumonia.   Continue Rocephin azithromycin empirically. Trach suctioning by RT.   Maintain O2 saturation greater than 92%.  End-stage renal disease on hemodialysis on Monday Wednesday Friday.  We will continue Renvela, calcitriol and Renagel as well as renal multivitamins.  Seen by nephrology.  Electrolytes and volume status managed with dialysis  Hypertensive emergency with flash pulmonary edema C/ home BP meds  Norvasc and ARB  C/ on as needed IV labetalol.  Dyslipidemia.  Statin therapy will be resumed.  Type 2 diabetes mellitus with hyperglycemias.   C/w basal and short acting insulin  GERD.  C/ PPI therapy   Paroxysmal atrial fib on eliquis.  C/ amiodarone for rhythm control. 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  SQ hep TID   Objective: Vitals:   10/17/19 2301 10/18/19 0450 10/18/19 0731 10/18/19 0807  BP:  (!) 144/71 (!) 181/78   Pulse:  66 70 75  Resp:  20 18 18   Temp:  98.6 F (37 C) 99 F (37.2 C)   TempSrc:  Oral Oral  SpO2: 94% 97% 97% 97%  Weight:  63 kg    Height:        Intake/Output Summary (Last 24 hours) at 10/18/2019 1440 Last data filed at 10/18/2019 1150 Gross per 24 hour  Intake 243 ml  Output 2410 ml  Net -2167 ml    Filed Weights   10/17/19 0433 10/17/19 1145 10/18/19 0450  Weight: 65.8 kg 65.7 kg 63 kg    Exam:  . General: 63 y.o. year-old female well developed well nourished in no acute distress.  Alert and oriented x4. . Cardiovascular: Regular rate and rhythm with no rubs or gallops.  No thyromegaly or JVD noted.   Marland Kitchen Respiratory: Mild rales at bases with no wheezes. Good inspiratory effort. Trach collar in place. . Abdomen: Soft nontender nondistended with normal bowel sounds x4 quadrants. . Musculoskeletal: No lower extremity edema. 2/4 pulses in all 4 extremities. Marland Kitchen Psychiatry: Mood is appropriate for condition and setting   Data Reviewed: CBC: Recent Labs  Lab 10/17/19 0445 10/18/19 0537  WBC 12.7* 12.6*  NEUTROABS 9.7*  --   HGB 9.4* 9.0*  HCT 28.4* 26.4*  MCV 89.3 87.4  PLT 250 [id number]   Basic Metabolic Panel: Recent Labs  Lab 10/17/19 0445 10/18/19 0517  NA 140 138  K 3.8 4.2  CL 98 96*  CO2 27 31  GLUCOSE 166* 179*  BUN 49* 28*  CREATININE 6.47* 5.34*  CALCIUM 8.9 8.7*  MG 1.8  --    GFR: Estimated Creatinine Clearance: 9.5 mL/min (A) (by C-G formula based on SCr of 5.34 mg/dL (H)). Liver Function Tests: No results for input(s): AST, ALT, ALKPHOS, BILITOT, PROT, ALBUMIN in the last 168 hours. No results for input(s): LIPASE, AMYLASE in the last 168 hours. No results for input(s): AMMONIA in the last 168 hours. Coagulation Profile: No results for input(s): INR, PROTIME in the last 168 hours. Cardiac Enzymes: No results for input(s): CKTOTAL, CKMB, CKMBINDEX, TROPONINI in the last 168 hours. BNP (last 3 results) No results for input(s): PROBNP in the last 8760 hours. HbA1C: Recent Labs    10/17/19 0907  HGBA1C 7.4*   CBG: Recent Labs  Lab 10/17/19 1455 10/17/19 1903 10/17/19 2048 10/18/19 0733 10/18/19 1140  GLUCAP 94 148* 225* 172* 240*   Lipid Profile: Recent Labs    10/17/19 0445  TRIG 83   Thyroid Function Tests: Recent Labs    10/17/19  0445  TSH 3.105   Anemia Panel: Recent Labs    10/17/19 0445  FERRITIN 1,293*   Urine analysis:    Component Value Date/Time   COLORURINE YELLOW (A) 09/09/2017 1056   APPEARANCEUR HAZY (A) 09/09/2017 1056   LABSPEC 1.013 09/09/2017 1056   PHURINE 7.0 09/09/2017 1056   GLUCOSEU NEGATIVE 09/09/2017 1056   HGBUR NEGATIVE 09/09/2017 1056   BILIRUBINUR NEGATIVE 09/09/2017 1056   KETONESUR NEGATIVE 09/09/2017 1056   PROTEINUR 100 (A) 09/09/2017 1056   NITRITE NEGATIVE 09/09/2017 1056   LEUKOCYTESUR MODERATE (A) 09/09/2017 1056   Sepsis Labs: @LABRCNTIP (procalcitonin:4,lacticidven:4)  ) Recent Results (from the past 240 hour(s))  SARS CORONAVIRUS 2 (TAT 6-24 HRS) Nasopharyngeal Nasopharyngeal Swab     Status: None   Collection Time: 10/17/19  4:45 AM   Specimen: Nasopharyngeal Swab  Result Value Ref Range Status   SARS Coronavirus 2 NEGATIVE NEGATIVE Final    Comment: (NOTE) SARS-CoV-2 target nucleic acids are NOT DETECTED. The SARS-CoV-2 RNA is generally detectable in upper and lower respiratory specimens during the acute phase of infection. Negative  results do not preclude SARS-CoV-2 infection, do not rule out co-infections with other pathogens, and should not be used as the sole basis for treatment or other patient management decisions. Negative results must be combined with clinical observations, patient history, and epidemiological information. The expected result is Negative. Fact Sheet for Patients: SugarRoll.be Fact Sheet for Healthcare Providers: https://www.woods-mathews.com/ This test is not yet approved or cleared by the [place] FDA and  has been authorized for detection and/or diagnosis of SARS-CoV-2 by FDA under an Emergency Use Authorization (EUA). This EUA will remain  in effect (meaning this test can be used) for the duration of the COVID-19 declaration under Section 56 4(b)(1) of the Act, 21 U.S.C. section  360bbb-3(b)(1), unless the authorization is terminated or revoked sooner. Performed at [facility], [place] [address], [place], [place] [address]   Culture, respiratory     Status: None (Preliminary result)   Collection Time: 10/17/19 12:42 PM   Specimen: Tracheal Aspirate  Result Value Ref Range Status   Specimen Description   Final    TRACHEAL ASPIRATE Performed at [facility], [address], [place], [place] [address]    Special Requests   Final    NONE Performed at [facility], [place], [place] [address]    Gram Stain   Final    ABUNDANT WBC PRESENT, PREDOMINANTLY PMN ABUNDANT GRAM POSITIVE RODS FEW GRAM POSITIVE COCCI Performed at [facility], [place] [address], [place], [place] [address]    Culture FEW KLEBSIELLA OXYTOCA  Final   Report Status PENDING  Incomplete  CULTURE, BLOOD (ROUTINE X 2) w Reflex to ID Panel     Status: None (Preliminary result)   Collection Time: 10/17/19 12:50 PM   Specimen: BLOOD  Result Value Ref Range Status   Specimen Description BLOOD PORTA CATH  Final   Special Requests   Final    BOTTLES DRAWN AEROBIC AND ANAEROBIC Blood Culture results may not be optimal due to an excessive volume of blood received in culture bottles   Culture   Final    NO GROWTH < 24 HOURS Performed at [facility], [address] [place], [place] [address]    Report Status PENDING  Incomplete  CULTURE, BLOOD (ROUTINE X 2) w Reflex to ID Panel     Status: None (Preliminary result)   Collection Time: 10/17/19  2:20 PM   Specimen: BLOOD  Result Value Ref Range Status   Specimen Description BLOOD RT HAND  Final   Special Requests   Final    BOTTLES DRAWN AEROBIC AND ANAEROBIC Blood Culture results may not be optimal due to an inadequate volume of blood received in culture bottles   Culture   Final    NO GROWTH < 24 HOURS Performed at [facility], [address], [place], [place] [address]    Report  Status PENDING  Incomplete  MRSA PCR Screening     Status: None   Collection Time: 10/17/19  9:50 PM   Specimen: Nasopharyngeal  Result Value Ref Range Status   MRSA by PCR NEGATIVE NEGATIVE Final    Comment:        The GeneXpert MRSA Assay (FDA approved for NASAL specimens only), is one component of a comprehensive MRSA colonization surveillance program. It is not intended to diagnose MRSA infection nor to guide or monitor treatment for MRSA infections. Performed at [facility], [address], [place], [place] [address]  Studies: No results found.  Scheduled Meds: . albuterol  2.5 mg Nebulization TID PC & HS  . amiodarone  200 mg Oral Daily  . aspirin EC  81 mg Oral Daily  . atorvastatin  80 mg Oral q1800  . carvedilol  25 mg Oral BID  . Chlorhexidine Gluconate Cloth  6 each Topical Q0600  . epoetin (EPOGEN/PROCRIT) injection  10,000 Units Intravenous Q M,W,F-HD  . furosemide  40 mg Intravenous Q12H  . gabapentin  100 mg Oral BID  . guaiFENesin  600 mg Oral BID  . heparin injection (subcutaneous)  5,000 Units Subcutaneous Q12H  . insulin aspart  0-9 Units Subcutaneous TID WC  . insulin glargine  14 Units Subcutaneous QHS  . Melatonin  5 mg Oral QHS  . midodrine  5 mg Oral Once per day on Mon Wed Fri  . pantoprazole  40 mg Oral BID  . polyethylene glycol  17 g Oral Daily  . senna-docusate  2 tablet Oral QHS  . sevelamer carbonate  1,600 mg Oral TID WC  . sodium chloride flush  3 mL Intravenous Q12H    Continuous Infusions: . sodium chloride 250 mL (10/18/19 0621)  . azithromycin 500 mg (10/18/19 0622)  . cefTRIAXone (ROCEPHIN)  IV 2 g (10/18/19 1006)     LOS: 1 day     Kayleen Memos, MD [facility] Pager [phone]  If 7PM-7AM, please contact night-coverage [email or web address] Password [email or web address] 10/18/2019, 2:40 PM

## 2019-10-19 ENCOUNTER — Inpatient Hospital Stay: Payer: Medicare Other

## 2019-10-19 LAB — BASIC METABOLIC PANEL
Anion gap: 11 (ref 5–15)
BUN: 49 mg/dL — ABNORMAL HIGH (ref 8–23)
CO2: 27 mmol/L (ref 22–32)
Calcium: 8.3 mg/dL — ABNORMAL LOW (ref 8.9–10.3)
Chloride: 97 mmol/L — ABNORMAL LOW (ref 98–111)
Creatinine, Ser: 7.46 mg/dL — ABNORMAL HIGH (ref 0.44–1.00)
GFR calc Af Amer: 6 mL/min — ABNORMAL LOW (ref >60)
GFR calc non Af Amer: 5 mL/min — ABNORMAL LOW (ref >60)
Glucose, Bld: 190 mg/dL — ABNORMAL HIGH (ref 70–99)
Potassium: 4.1 mmol/L (ref 3.5–5.1)
Sodium: 135 mmol/L (ref 135–145)

## 2019-10-19 LAB — CBC
HCT: 25.4 % — ABNORMAL LOW (ref 36.0–46.0)
Hemoglobin: 8.3 g/dL — ABNORMAL LOW (ref 12.0–15.0)
MCH: 29.5 pg (ref 26.0–34.0)
MCHC: 32.7 g/dL (ref 30.0–36.0)
MCV: 90.4 fL (ref 80.0–100.0)
Platelets: 240 10*3/uL (ref 150–400)
RBC: 2.81 MIL/uL — ABNORMAL LOW (ref 3.87–5.11)
RDW: 15.5 % (ref 11.5–15.5)
WBC: 13.2 10*3/uL — ABNORMAL HIGH (ref 4.0–10.5)
nRBC: 0 % (ref 0.0–0.2)

## 2019-10-19 LAB — GLUCOSE, CAPILLARY
Glucose-Capillary: 180 mg/dL — ABNORMAL HIGH (ref 70–99)
Glucose-Capillary: 189 mg/dL — ABNORMAL HIGH (ref 70–99)
Glucose-Capillary: 153 mg/dL — ABNORMAL HIGH (ref 70–99)
Glucose-Capillary: 130 mg/dL — ABNORMAL HIGH (ref 70–99)

## 2019-10-19 LAB — PROCALCITONIN: Procalcitonin: 1.12 ng/mL

## 2019-10-19 MED ORDER — APIXABAN 2.5 MG PO TABS
2.5000 mg | ORAL_TABLET | Freq: Two times a day (BID) | ORAL | Status: DC
  Administered 2019-10-19 – 2019-10-24 (×11): 2.5 mg via ORAL
  Filled 2019-10-19 (×11): qty 1

## 2019-10-19 NOTE — Progress Notes (Signed)
[facility]  ROUNDING NOTE   Subjective:   Patient states her cough and sputum production has improved.   Objective:  Vital signs in last 24 hours:  Temp:  [98.7 F (37.1 C)-99 F (37.2 C)] 98.7 F (37.1 C) (11/15 0740) Pulse Rate:  [63-76] 63 (11/15 0813) Resp:  [18-20] 18 (11/15 0813) BP: (125-152)/(62-75) 125/75 (11/15 0740) SpO2:  [91 %-97 %] 97 % (11/15 0813) FiO2 (%):  [28 %] 28 % (11/15 0813) Weight:  [64 kg] 64 kg (11/15 0258)  Weight change: -1.652 kg Filed Weights   10/17/19 1145 10/18/19 0450 10/19/19 0258  Weight: 65.7 kg 63 kg 64 kg    Intake/Output: I/O last 3 completed shifts: In: 772.8 [P.O.:240; I.V.:3; IV Piggyback:529.8] Out: 0    Intake/Output this shift:  No intake/output data recorded.  Physical Exam: General: Sitting up in stretch  Head: Normocephalic, atraumatic. Moist oral mucosal membranes  Eyes: Anicteric, PERRL  Neck: +tracheostomy  Lungs:  Coarse bilateral breath sounds.   Heart: Regular rate and rhythm  Abdomen:  Soft, nontender  Extremities:  trace peripheral edema.  Neurologic: Nonfocal, moving all four extremities  Skin: No lesions  Access: Left AVG    Basic Metabolic Panel: Recent Labs  Lab 10/17/19 0445 10/18/19 0517 10/19/19 0553  NA 140 138 135  K 3.8 4.2 4.1  CL 98 96* 97*  CO2 27 31 27   GLUCOSE 166* 179* 190*  BUN 49* 28* 49*  CREATININE 6.47* 5.34* 7.46*  CALCIUM 8.9 8.7* 8.3*  MG 1.8  --   --     Liver Function Tests: No results for input(s): AST, ALT, ALKPHOS, BILITOT, PROT, ALBUMIN in the last 168 hours. No results for input(s): LIPASE, AMYLASE in the last 168 hours. No results for input(s): AMMONIA in the last 168 hours.  CBC: Recent Labs  Lab 10/17/19 0445 10/18/19 0537 10/19/19 0553  WBC 12.7* 12.6* 13.2*  NEUTROABS 9.7*  --   --   HGB 9.4* 9.0* 8.3*  HCT 28.4* 26.4* 25.4*  MCV 89.3 87.4 90.4  PLT 250 236 240    Cardiac Enzymes: No results for input(s): CKTOTAL, CKMB,  CKMBINDEX, TROPONINI in the last 168 hours.  BNP: Invalid input(s): POCBNP  CBG: Recent Labs  Lab 10/18/19 1140 10/18/19 1645 10/18/19 2110 10/19/19 0742 10/19/19 1219  GLUCAP 240* 201* 208* 180* 189*    Microbiology: Results for orders placed or performed during the hospital encounter of 10/17/19  SARS CORONAVIRUS 2 (TAT 6-24 HRS) Nasopharyngeal Nasopharyngeal Swab     Status: None   Collection Time: 10/17/19  4:45 AM   Specimen: Nasopharyngeal Swab  Result Value Ref Range Status   SARS Coronavirus 2 NEGATIVE NEGATIVE Final    Comment: (NOTE) SARS-CoV-2 target nucleic acids are NOT DETECTED. The SARS-CoV-2 RNA is generally detectable in upper and lower respiratory specimens during the acute phase of infection. Negative results do not preclude SARS-CoV-2 infection, do not rule out co-infections with other pathogens, and should not be used as the sole basis for treatment or other patient management decisions. Negative results must be combined with clinical observations, patient history, and epidemiological information. The expected result is Negative. Fact Sheet for Patients: SugarRoll.be Fact Sheet for Healthcare Providers: https://www.woods-mathews.com/ This test is not yet approved or cleared by the [place] FDA and  has been authorized for detection and/or diagnosis of SARS-CoV-2 by FDA under an Emergency Use Authorization (EUA). This EUA will remain  in effect (meaning this test can be used) for the  duration of the COVID-19 declaration under Section 56 4(b)(1) of the Act, 21 U.S.C. section 360bbb-3(b)(1), unless the authorization is terminated or revoked sooner. Performed at [facility], [place] [address] [place], [place] [address]   Culture, respiratory     Status: None (Preliminary result)   Collection Time: 10/17/19 12:42 PM   Specimen: Tracheal Aspirate  Result Value Ref Range Status   Specimen Description    Final    TRACHEAL ASPIRATE Performed at [facility], [address], [place], [place] [address]    Special Requests   Final    NONE Performed at [facility], [address] [place], [place] [address]    Gram Stain   Final    ABUNDANT WBC PRESENT, PREDOMINANTLY PMN ABUNDANT GRAM POSITIVE RODS FEW GRAM POSITIVE COCCI    Culture   Final    FEW KLEBSIELLA OXYTOCA CULTURE REINCUBATED FOR BETTER GROWTH Performed at [facility], [place] [address] [place], [place] [address]    Report Status PENDING  Incomplete   Organism ID, Bacteria KLEBSIELLA OXYTOCA  Final      Susceptibility   Klebsiella oxytoca - MIC*    AMPICILLIN >=32 RESISTANT Resistant     CEFAZOLIN 16 SENSITIVE Sensitive     CEFEPIME <=1 SENSITIVE Sensitive     CEFTAZIDIME <=1 SENSITIVE Sensitive     CEFTRIAXONE <=1 SENSITIVE Sensitive     CIPROFLOXACIN <=0.25 SENSITIVE Sensitive     GENTAMICIN <=1 SENSITIVE Sensitive     IMIPENEM <=0.25 SENSITIVE Sensitive     TRIMETH/SULFA <=20 SENSITIVE Sensitive     AMPICILLIN/SULBACTAM 16 INTERMEDIATE Intermediate     PIP/TAZO <=4 SENSITIVE Sensitive     Extended ESBL NEGATIVE Sensitive     * FEW KLEBSIELLA OXYTOCA  CULTURE, BLOOD (ROUTINE X 2) w Reflex to ID Panel     Status: None (Preliminary result)   Collection Time: 10/17/19 12:50 PM   Specimen: BLOOD  Result Value Ref Range Status   Specimen Description BLOOD PORTA CATH  Final   Special Requests   Final    BOTTLES DRAWN AEROBIC AND ANAEROBIC Blood Culture results may not be optimal due to an excessive volume of blood received in culture bottles   Culture   Final    NO GROWTH < 24 HOURS Performed at [facility], [address], [place], [place] [address]    Report Status PENDING  Incomplete  CULTURE, BLOOD (ROUTINE X 2) w Reflex to ID Panel     Status: None (Preliminary result)   Collection Time: 10/17/19  2:20 PM   Specimen: BLOOD  Result Value Ref Range Status   Specimen  Description BLOOD RT HAND  Final   Special Requests   Final    BOTTLES DRAWN AEROBIC AND ANAEROBIC Blood Culture results may not be optimal due to an inadequate volume of blood received in culture bottles   Culture   Final    NO GROWTH < 24 HOURS Performed at [facility], [address] [place], [place] [address]    Report Status PENDING  Incomplete  MRSA PCR Screening     Status: None   Collection Time: 10/17/19  9:50 PM   Specimen: Nasopharyngeal  Result Value Ref Range Status   MRSA by PCR NEGATIVE NEGATIVE Final    Comment:        The GeneXpert MRSA Assay (FDA approved for NASAL specimens only), is one component of a comprehensive MRSA colonization surveillance program. It is not intended  to diagnose MRSA infection nor to guide or monitor treatment for MRSA infections. Performed at [facility], [place] [place], [place] [address]   Culture, respiratory     Status: None (Preliminary result)   Collection Time: 10/18/19  2:45 PM   Specimen: Tracheal Aspirate  Result Value Ref Range Status   Specimen Description   Final    TRACHEAL ASPIRATE Performed at [facility], [address] [place], [place] [address]    Special Requests   Final    NONE Performed at [facility], [place] [address] [place],  [address]    Gram Stain PENDING  Incomplete   Culture   Final    CULTURE REINCUBATED FOR BETTER GROWTH Performed at [facility], [place] [address], [place],  [address]    Report Status PENDING  Incomplete    Coagulation Studies: No results for input(s): LABPROT, INR in the last 72 hours.  Urinalysis: No results for input(s): COLORURINE, LABSPEC, PHURINE, GLUCOSEU, HGBUR, BILIRUBINUR, KETONESUR, PROTEINUR, UROBILINOGEN, NITRITE, LEUKOCYTESUR in the last 72 hours.  Invalid input(s): APPERANCEUR    Imaging: Dg Chest Port 1 View  Result Date: 10/19/2019 CLINICAL DATA:  Community acquired pneumonia EXAM:  PORTABLE CHEST 1 VIEW COMPARISON:  10/17/2019 FINDINGS: Tip of tracheostomy tube is above the carina. Previous median sternotomy and mitral valvuloplasty. Mild cardiac enlargement. Bilateral pleural effusions and increased interstitial edema is noted. Atelectasis and/or airspace disease noted in the left lower lobe and right lung base. IMPRESSION: 1. Mild CHF. 2. Bilateral lower lobe opacities which may represent atelectasis and/or pneumonia. Electronically Signed   By: Kerby Moors M.D.   On: 10/19/2019 11:33     Medications:   . sodium chloride 250 mL (10/18/19 0621)  . azithromycin 500 mg (10/19/19 0620)  . cefTRIAXone (ROCEPHIN)  IV 2 g (10/19/19 0855)   . albuterol  2.5 mg Nebulization TID PC & HS  . amiodarone  200 mg Oral Daily  . aspirin EC  81 mg Oral Daily  . atorvastatin  80 mg Oral q1800  . carvedilol  25 mg Oral BID  . Chlorhexidine Gluconate Cloth  6 each Topical Q0600  . epoetin (EPOGEN/PROCRIT) injection  10,000 Units Intravenous Q M,W,F-HD  . furosemide  40 mg Intravenous Q12H  . gabapentin  100 mg Oral BID  . guaiFENesin  600 mg Oral BID  . heparin injection (subcutaneous)  5,000 Units Subcutaneous Q12H  . insulin aspart  0-9 Units Subcutaneous TID WC  . insulin glargine  14 Units Subcutaneous QHS  . Melatonin  5 mg Oral QHS  . midodrine  5 mg Oral Once per day on Mon Wed Fri  . pantoprazole  40 mg Oral BID  . polyethylene glycol  17 g Oral Daily  . senna-docusate  2 tablet Oral QHS  . sevelamer carbonate  1,600 mg Oral TID WC  . sodium chloride flush  3 mL Intravenous Q12H   sodium chloride, acetaminophen, ALPRAZolam, chlorpheniramine-HYDROcodone, labetalol, labetalol, ondansetron (ZOFRAN) IV, oxyCODONE, QUEtiapine, sodium chloride flush, zolpidem  Assessment/ Plan:  [name] is a 63 y.o. black female with end stage renal disease on hemodialysis, tracheostomy, hypotension, diabetes mellitus type II insulin dependent, diabetic neuropathy,  coronary  artery disease, mitral valve repair, atrial flutter on apixaban  [facility] MWF [facility] left AVG Chronic tracheostomy  1. End Stage Renal Disease: with pulmonary edema - emergent dialysis on admission - Continue MWF schedule  2. Respiratory failure: requiring oxygen. COVID-19  negative Status post tracheostomy - empiric antibiotics  3. Hypertension: usually requires midodrine before dialysis. However with elevated blood pressure on encounter.  - midodrine before dialysis.  4. Anemia of chronic kidney disease:  Hemoglobin 8.3 - EPO with HD treatments.  5. Secondary Hyperparathyroidism: - sevelamer   LOS: 2 Maxwell Lemen 11/15/20201:02 PM

## 2019-10-19 NOTE — Progress Notes (Signed)
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  Symptoms improving with dialysis.  Subjective   Feels a lot better.  Anxious to go home.  Inpatient Medications    . albuterol  2.5 mg Nebulization TID PC & HS  . amiodarone  200 mg Oral Daily  . aspirin EC  81 mg Oral Daily  . atorvastatin  80 mg Oral q1800  . carvedilol  25 mg Oral BID  . Chlorhexidine Gluconate Cloth  6 each Topical Q0600  . epoetin (EPOGEN/PROCRIT) injection  10,000 Units Intravenous Q M,W,F-HD  . furosemide  40 mg Intravenous Q12H  . gabapentin  100 mg Oral BID  . guaiFENesin  600 mg Oral BID  . heparin injection (subcutaneous)  5,000 Units Subcutaneous Q12H  . insulin aspart  0-9 Units Subcutaneous TID WC  . insulin glargine  14 Units Subcutaneous QHS  . Melatonin  5 mg Oral QHS  . midodrine  5 mg Oral Once per day on Mon Wed Fri  . pantoprazole  40 mg Oral BID  . polyethylene glycol  17 g Oral Daily  . senna-docusate  2 tablet Oral QHS  . sevelamer carbonate  1,600 mg Oral TID WC  . sodium chloride flush  3 mL Intravenous Q12H    Vital Signs    Vitals:   10/19/19 0258 10/19/19 0329 10/19/19 0740 10/19/19 0813  BP:  (!) 152/66 125/75   Pulse:  64 70 63  Resp:  20  18  Temp:  98.7 F (37.1 C) 98.7 F (37.1 C)   TempSrc:  Oral Oral   SpO2:  96% 97% 97%  Weight: 64 kg     Height:         Intake/Output Summary (Last 24 hours) at 10/19/2019 1256 Last data filed at 10/19/2019 0620 Gross per 24 hour  Intake 529.84 ml  Output 0 ml  Net 529.84 ml   Filed Weights   10/17/19 1145 10/18/19 0450 10/19/19 0258  Weight: 65.7 kg 63 kg 64 kg    Physical Exam    GEN: Well nourished, well developed, in no acute distress.  HEENT: normal.  Neck: Tracheostomy in place Cardiac: RRR, no murmurs, rubs, or gallops. No clubbing, cyanosis, edema.  Radials/DP/PT 2+ and equal bilaterally.  Respiratory: Tracheostomy in place.  Mild expiratory wheezes in the upper airway. GI: Soft, nontender, nondistended, BS + x 4. MS: no deformity or atrophy. Skin: warm and dry, no rash. Neuro:  Strength and sensation are intact. Psych: Normal affect.  Labs    CBC Recent Labs    10/17/19 0445 10/18/19 0537 10/19/19 0553  WBC 12.7* 12.6* 13.2*  NEUTROABS 9.7*  --   --   HGB 9.4* 9.0* 8.3*  HCT 28.4* 26.4* 25.4*  MCV 89.3 87.4 90.4  PLT 250 236 [id number]   Basic Metabolic Panel Recent Labs    10/17/19 0445 10/18/19 0517 10/19/19 0553  NA 140 138 135  K 3.8 4.2  4.1  CL 98 96* 97*  CO2 27 31 27   GLUCOSE 166* 179* 190*  BUN 49* 28* 49*  CREATININE 6.47* 5.34* 7.46*  CALCIUM 8.9 8.7* 8.3*  MG 1.8  --   --    Liver Function Tests No results for input(s): AST, ALT, ALKPHOS, BILITOT, PROT, ALBUMIN in the last 72 hours. No results for input(s): LIPASE, AMYLASE in the last 72 hours. Cardiac Enzymes No results for input(s): CKTOTAL, CKMB, CKMBINDEX, TROPONINI in the last 72 hours. BNP Recent Labs    10/17/19 0445 10/18/19 0517  BNP 1,350.0* 796.0*   D-Dimer No results for input(s): DDIMER in the last 72 hours. Hemoglobin A1C Recent Labs    10/17/19 0907  HGBA1C 7.4*   Fasting Lipid Panel Recent Labs    10/17/19 0445  TRIG 83   Thyroid Function Tests Recent Labs    10/17/19 0445  TSH 3.105    Telemetry    Normal sinus rhythm  ECG    Normal sinus rhythm with no  ischemia  Radiology    Dg Chest Port 1 View  Result Date: 10/19/2019 CLINICAL DATA:  Community acquired pneumonia EXAM: PORTABLE CHEST 1 VIEW COMPARISON:  10/17/2019 FINDINGS: Tip of tracheostomy tube is above the carina. Previous median sternotomy and mitral valvuloplasty. Mild cardiac enlargement. Bilateral pleural effusions and increased interstitial edema is noted. Atelectasis and/or airspace disease noted in the left lower lobe and right lung base. IMPRESSION: 1. Mild CHF. 2. Bilateral lower lobe opacities which may represent atelectasis and/or pneumonia. Electronically Signed   By: Kerby Moors M.D.   On: 10/19/2019 11:33   Dg Chest Portable 1 View  Result Date: 10/17/2019 CLINICAL DATA:  Shortness of breath. Hypoxia. Dialysis patient. EXAM: PORTABLE CHEST 1 VIEW COMPARISON:  Two-view chest x-ray 10/01/2017 FINDINGS: Chronic tracheostomy is noted. The heart is enlarged. Mild pulmonary vascular congestion is present. Bibasilar airspace disease is new. Bilateral pleural effusions are present. There is no significant consolidation in the upper lobes. The visualized soft tissues and bony thorax are unremarkable. IMPRESSION: 1. Cardiomegaly with mild pulmonary vascular congestion and bilateral pleural effusions compatible with congestive heart failure. 2. Bibasilar airspace disease may represent atelectasis, but is concerning for bibasilar pneumonia. Electronically Signed   By: San Morelle M.D.   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 Improved after hemodialysis.  Improved today.  CHF-has HFpEF with ef normal [facility].Will attempt to control with iv lasix 40 bid. Carvedilol 25 mg bid.    Echo showed  ejection fraction of 55 to 60% with moderate LVH.  RV function is normal.  Mild to moderate sclerosis of the aortic valve with no aortic stenosis.  Estimated RV systolic pressure of [id number].  BNP is improved.  Will follow and likely will dialyze tomorrow.  Will reevaluate in the morning however if stable postdialysis may be able to discharge with outpatient follow-up with her cardiologist at [facility].   Paroxysmal afib-is on apixiban 2.5 mg bid and amiodarone 200 mg daily. Currently in nsr. Will continue  with this regimen.   Cad/mitral valve disease. -Had ruptured posterior papillar Next y muscle with cardiogenic shock in 2017 . Had inferior posterior stemi in 2013 with a des to [place]. Underwent emergent mvr with a tissue prosthesis due to ruptured posterior papillary muscle. Pst op course complicated by ckd and prolonged intubation requiring trach. Currently appears stable from ischemic [place].  Surgical intervention of the mitral valve appears well intact.  Conitnue with current outpatient regimen for now. Includes asa 81 mg daily and atorvastatin at 80 mg daily.   HTN-currently controlled after hd.  Continue with carvedilol at 25 mg dialy and follow.   Hyperlipidemia-conitnue with atorvastatin.   DM-insultin as is being done.  Signed, Javier Docker Levora Werden MD 10/19/2019, 12:56 PM  Pager: [phone]

## 2019-10-19 NOTE — Progress Notes (Signed)
PROGRESS NOTE  Deborah [name] [id number] DOB: 22-Aug-1956 DOA: 10/17/2019 PCP: System, Pcp Not In  HPI/Recap of past 24 hours: [name] a63 y.o.African-American femalewith a known history of type II arrhythmias, hypertension, dyslipidemia and end-stage renal disease on hemodialysis on Monday Wednesday Friday, who presented to the emergency room with acute onset of worsening dyspnea and hypoxia with a pulse ox entry of 81% on room air. Patient is status post tracheostomy but is not on home O2. She has been having dry cough here occasionally productive of clear sputum at home. She admitted to orthopnea and paroxysmal nocturnal dyspnea. She had no fever or chills.No worsening lower extremity edema. No headache or dizziness or blurred vision. No nausea vomiting or abdominal pain. She did not miss any hemodialysis session.  Upon presentation to the emergency room, blood pressure was 184/95and later 200/89with pulse currently of 96% on her percent nonrebreather and a temperature of 99.2. Labs revealed mild cytosis with WBC of 12.7 and neutrophilia. ANC/ALC ratio was 9.7/1.4. BNP was 1350 and high-sensitivity troponin I of 37. CMP showed BUN of 49 creatinine 6.47. Blood cultures were ordered.  The patient was given IV cefepime and vancomycin as well as IV nitroglycerin drip. She will be admitted to a telemetry bed for further evaluation and management.  Patient was seen and examined at her bedside in the ED.  Reports dyspnea with minimal exertion worsened with ambulation.  Also reports pleuritic pain with nonproductive cough.  COVID-19 test in process.  Plan for HD today.  Last HD was on Wednesday, 10/15/2019.  Independently viewed chest x-ray done on admission which showed cardiomegaly with increase in pulmonary vascularity and bilateral pleural effusions.  BNP elevated greater than 1300.  Last 2D echo done in 2018 showed normal LVEF 55 to 60%.  A repeat TTE is pending.   10/19/19: Patient was seen and examined at her bedside this morning.  She states she feels better.  Cough is improved.  She is on Rocephin and azithromycin for Klebsiella oxytoca pneumonia.  Awaiting sensitivities.  Will obtain home O2 evaluation for DC planning.   Assessment/Plan: Active Problems:   Acute CHF (congestive heart failure) ([name])  Acute on chronic diastolic CHF with subsequent acute hypoxic respiratory failure.  Patient's last EF was 55 to 60% in 2018.  Hemodialyzed on 10/17/2019 with improvement of symptomatology. Seen by cardiology, following. Medications as recommended by cardiology. 2D echo done on 10/18/2019 shows normal LVEF.  Reviewed by cardiology.  No changes.  Will need to follow-up with her cardiologist outpatient.  Klebsiella oxytolca community-acquired bacterial pneumonia.   Sputum culture positive for Klebsiella Awaiting sensitivities Continue Rocephin and azithromycin empirically continue the sensitivities return.   Procalcitonin elevated greater than 1.1. Continue to monitor fever curve and WBC Trach suctioning by RT.   continue to maintain O2 saturation greater than 92%.  End-stage renal disease on hemodialysis on Monday Wednesday Friday.  We will continue Renvela, calcitriol and Renagel as well as renal multivitamins.  Seen by nephrology.  Electrolytes and volume status managed with dialysis Possible hemodialysis tomorrow 10/20/2019.  Hypertensive emergency with flash pulmonary edema C/ home BP meds  Blood pressure is at goal Continue Norvasc and ARB  Continue on as needed IV labetalol.  Dyslipidemia.  Continue statin therapy   Type 2 diabetes mellitus with hyperglycemias.   C/w basal and short acting insulin Hemoglobin A1c 7.4 on 10/17/2019 Continue to avoid hypoglycemia  GERD.  C/ PPI therapy   Paroxysmal atrial fib on eliquis.  C/ amiodarone  for rhythm control. 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  eliquis   Objective: Vitals:   10/19/19 0258 10/19/19 0329 10/19/19 0740 10/19/19 0813  BP:  (!) 152/66 125/75   Pulse:  64 70 63  Resp:  20  18  Temp:  98.7 F (37.1 C) 98.7 F (37.1 C)   TempSrc:  Oral Oral   SpO2:  96% 97% 97%  Weight: 64 kg     Height:        Intake/Output Summary (Last 24 hours) at 10/19/2019 1328 Last data filed at 10/19/2019 [id number] Gross per 24 hour  Intake 529.84 ml  Output 0 ml  Net 529.84 ml   Filed Weights   10/17/19 1145 10/18/19 0450 10/19/19 0258  Weight: 65.7 kg 63 kg 64 kg    Exam:  . General: 64 y.o. year-old female well-developed well-nourished in no acute distress.  Alert and oriented x4.   . Cardiovascular: Regular rate and rhythm no rubs or gallops. Marland Kitchen Respiratory: Mild rales at bases with no wheezes noted.  Poor respiratory effort.  Trach collar in place.   . Abdomen: Soft nontender nondistended normal bowel sounds present.   . Musculoskeletal: No lower extremity edema.  Peripheral pulses in all 4 extremities.   Marland Kitchen Psychiatry: Mood is appropriate for condition and setting.   Data Reviewed: CBC: Recent Labs  Lab 10/17/19 0445 10/18/19 0537 10/19/19 0553  WBC 12.7* 12.6* 13.2*  NEUTROABS 9.7*  --   --   HGB 9.4* 9.0* 8.3*  HCT 28.4* 26.4* 25.4*  MCV 89.3 87.4 90.4  PLT 250 236 [id number]   Basic Metabolic Panel: Recent Labs  Lab 10/17/19 0445 10/18/19 0517 10/19/19 0553  NA 140 138 135  K 3.8 4.2 4.1  CL 98 96* 97*  CO2 27 31 27   GLUCOSE 166* 179* 190*  BUN 49* 28* 49*  CREATININE 6.47* 5.34* 7.46*  CALCIUM 8.9 8.7* 8.3*  MG 1.8  --   --    GFR: Estimated Creatinine Clearance: 6.9 mL/min (A) (by C-G formula based on SCr of 7.46 mg/dL (H)). Liver Function Tests: No results for input(s): AST, ALT, ALKPHOS, BILITOT, PROT, ALBUMIN in the last 168 hours. No results for input(s): LIPASE, AMYLASE in the last 168 hours. No results for input(s): AMMONIA in the last 168  hours. Coagulation Profile: No results for input(s): INR, PROTIME in the last 168 hours. Cardiac Enzymes: No results for input(s): CKTOTAL, CKMB, CKMBINDEX, TROPONINI in the last 168 hours. BNP (last 3 results) No results for input(s): PROBNP in the last 8760 hours. HbA1C: Recent Labs    10/17/19 0907  HGBA1C 7.4*   CBG: Recent Labs  Lab 10/18/19 1140 10/18/19 1645 10/18/19 2110 10/19/19 0742 10/19/19 1219  GLUCAP 240* 201* 208* 180* 189*   Lipid Profile: Recent Labs    10/17/19 0445  TRIG 83   Thyroid Function Tests: Recent Labs    10/17/19 0445  TSH 3.105   Anemia Panel: Recent Labs    10/17/19 0445  FERRITIN 1,293*   Urine analysis:    Component Value Date/Time   COLORURINE YELLOW (A) 09/09/2017 1056   APPEARANCEUR HAZY (A) 09/09/2017 1056   LABSPEC 1.013 09/09/2017 1056   PHURINE 7.0 09/09/2017 1056   GLUCOSEU NEGATIVE 09/09/2017 1056   HGBUR NEGATIVE 09/09/2017 1056   BILIRUBINUR NEGATIVE 09/09/2017 1056   KETONESUR NEGATIVE 09/09/2017 1056   PROTEINUR 100 (A) 09/09/2017 1056   NITRITE NEGATIVE 09/09/2017  [place] (A) 09/09/2017 1056   Sepsis Labs: @LABRCNTIP (procalcitonin:4,lacticidven:4)  ) Recent Results (from the past 240 hour(s))  SARS CORONAVIRUS 2 (TAT 6-24 HRS) Nasopharyngeal Nasopharyngeal Swab     Status: None   Collection Time: 10/17/19  4:45 AM   Specimen: Nasopharyngeal Swab  Result Value Ref Range Status   SARS Coronavirus 2 NEGATIVE NEGATIVE Final    Comment: (NOTE) SARS-CoV-2 target nucleic acids are NOT DETECTED. The SARS-CoV-2 RNA is generally detectable in upper and lower respiratory specimens during the acute phase of infection. Negative results do not preclude SARS-CoV-2 infection, do not rule out co-infections with other pathogens, and should not be used as the sole basis for treatment or other patient management decisions. Negative results must be combined with clinical observations, patient  history, and epidemiological information. The expected result is Negative. Fact Sheet for Patients: SugarRoll.be Fact Sheet for Healthcare Providers: https://www.woods-mathews.com/ This test is not yet approved or cleared by the [place] FDA and  has been authorized for detection and/or diagnosis of SARS-CoV-2 by FDA under an Emergency Use Authorization (EUA). This EUA will remain  in effect (meaning this test can be used) for the duration of the COVID-19 declaration under Section 56 4(b)(1) of the Act, 21 U.S.C. section 360bbb-3(b)(1), unless the authorization is terminated or revoked sooner. Performed at [facility], [place] [address], [place], [place] [address]   Culture, respiratory     Status: None (Preliminary result)   Collection Time: 10/17/19 12:42 PM   Specimen: Tracheal Aspirate  Result Value Ref Range Status   Specimen Description   Final    TRACHEAL ASPIRATE Performed at [facility], [address] [place], [place] [address]    Special Requests   Final    NONE Performed at [facility], [address] [place], [place] [address]    Gram Stain   Final    ABUNDANT WBC PRESENT, PREDOMINANTLY PMN ABUNDANT GRAM POSITIVE RODS FEW GRAM POSITIVE COCCI    Culture   Final    FEW KLEBSIELLA OXYTOCA CULTURE REINCUBATED FOR BETTER GROWTH Performed at [facility], [place] [address] [place], [place] [address]    Report Status PENDING  Incomplete   Organism ID, Bacteria KLEBSIELLA OXYTOCA  Final      Susceptibility   Klebsiella oxytoca - MIC*    AMPICILLIN >=32 RESISTANT Resistant     CEFAZOLIN 16 SENSITIVE Sensitive     CEFEPIME <=1 SENSITIVE Sensitive     CEFTAZIDIME <=1 SENSITIVE Sensitive     CEFTRIAXONE <=1 SENSITIVE Sensitive     CIPROFLOXACIN <=0.25 SENSITIVE Sensitive     GENTAMICIN <=1 SENSITIVE Sensitive     IMIPENEM <=0.25 SENSITIVE Sensitive     TRIMETH/SULFA <=20 SENSITIVE  Sensitive     AMPICILLIN/SULBACTAM 16 INTERMEDIATE Intermediate     PIP/TAZO <=4 SENSITIVE Sensitive     Extended ESBL NEGATIVE Sensitive     * FEW KLEBSIELLA OXYTOCA  CULTURE, BLOOD (ROUTINE X 2) w Reflex to ID Panel     Status: None (Preliminary result)   Collection Time: 10/17/19 12:50 PM   Specimen: BLOOD  Result Value Ref Range Status   Specimen Description BLOOD PORTA CATH  Final   Special Requests   Final    BOTTLES DRAWN AEROBIC AND ANAEROBIC Blood Culture results may not be optimal due to an excessive volume of blood received in culture bottles   Culture   Final    NO GROWTH < 24 HOURS Performed at  [facility], [address], [place], [place] [address]    Report Status PENDING  Incomplete  CULTURE, BLOOD (ROUTINE X 2) w Reflex to ID Panel     Status: None (Preliminary result)   Collection Time: 10/17/19  2:20 PM   Specimen: BLOOD  Result Value Ref Range Status   Specimen Description BLOOD RT HAND  Final   Special Requests   Final    BOTTLES DRAWN AEROBIC AND ANAEROBIC Blood Culture results may not be optimal due to an inadequate volume of blood received in culture bottles   Culture   Final    NO GROWTH < 24 HOURS Performed at [facility], [address], [place], [place] [address]    Report Status PENDING  Incomplete  MRSA PCR Screening     Status: None   Collection Time: 10/17/19  9:50 PM   Specimen: Nasopharyngeal  Result Value Ref Range Status   MRSA by PCR NEGATIVE NEGATIVE Final    Comment:        The GeneXpert MRSA Assay (FDA approved for NASAL specimens only), is one component of a comprehensive MRSA colonization surveillance program. It is not intended to diagnose MRSA infection nor to guide or monitor treatment for MRSA infections. Performed at [facility], [address] [place], [place] [address]   Culture, respiratory     Status: None (Preliminary result)   Collection Time: 10/18/19  2:45 PM   Specimen: Tracheal  Aspirate  Result Value Ref Range Status   Specimen Description   Final    TRACHEAL ASPIRATE Performed at [facility], [address], [place], [place] [address]    Special Requests   Final    NONE Performed at [facility], [address] [place], [place] [address]    Gram Stain PENDING  Incomplete   Culture   Final    CULTURE REINCUBATED FOR BETTER GROWTH Performed at [facility], [place] [address], [place],  [address]    Report Status PENDING  Incomplete      Studies: Dg Chest Port 1 View  Result Date: 10/19/2019 CLINICAL DATA:  Community acquired pneumonia EXAM: PORTABLE CHEST 1 VIEW COMPARISON:  10/17/2019 FINDINGS: Tip of tracheostomy tube is above the carina. Previous median sternotomy and mitral valvuloplasty. Mild cardiac enlargement. Bilateral pleural effusions and increased interstitial edema is noted. Atelectasis and/or airspace disease noted in the left lower lobe and right lung base. IMPRESSION: 1. Mild CHF. 2. Bilateral lower lobe opacities which may represent atelectasis and/or pneumonia. Electronically Signed   By: Kerby Moors M.D.   On: 10/19/2019 11:33    Scheduled Meds: . albuterol  2.5 mg Nebulization TID PC & HS  . amiodarone  200 mg Oral Daily  . aspirin EC  81 mg Oral Daily  . atorvastatin  80 mg Oral q1800  . carvedilol  25 mg Oral BID  . Chlorhexidine Gluconate Cloth  6 each Topical Q0600  . epoetin (EPOGEN/PROCRIT) injection  10,000 Units Intravenous Q M,W,F-HD  . furosemide  40 mg Intravenous Q12H  . gabapentin  100 mg Oral BID  . guaiFENesin  600 mg Oral BID  . heparin injection (subcutaneous)  5,000 Units Subcutaneous Q12H  . insulin aspart  0-9 Units Subcutaneous TID WC  . insulin glargine  14 Units Subcutaneous QHS  . Melatonin  5 mg Oral QHS  . midodrine  5 mg Oral Once per day on Mon Wed Fri  . pantoprazole  40 mg Oral  BID  . polyethylene glycol  17 g Oral Daily  . senna-docusate  2 tablet Oral QHS  .  sevelamer carbonate  1,600 mg Oral TID WC  . sodium chloride flush  3 mL Intravenous Q12H    Continuous Infusions: . sodium chloride 250 mL (10/18/19 0621)  . azithromycin 500 mg (10/19/19 0620)  . cefTRIAXone (ROCEPHIN)  IV 2 g (10/19/19 0855)     LOS: 2 days     Kayleen Memos, MD [facility] Pager [phone]  If 7PM-7AM, please contact night-coverage [email or web address] Password [email or web address] 10/19/2019, 1:28 PM

## 2019-10-20 LAB — BASIC METABOLIC PANEL
Anion gap: 14 (ref 5–15)
BUN: 70 mg/dL — ABNORMAL HIGH (ref 8–23)
CO2: 24 mmol/L (ref 22–32)
Calcium: 9 mg/dL (ref 8.9–10.3)
Chloride: 100 mmol/L (ref 98–111)
Creatinine, Ser: 8.66 mg/dL — ABNORMAL HIGH (ref 0.44–1.00)
GFR calc Af Amer: 5 mL/min — ABNORMAL LOW (ref >60)
GFR calc non Af Amer: 4 mL/min — ABNORMAL LOW (ref >60)
Glucose, Bld: 212 mg/dL — ABNORMAL HIGH (ref 70–99)
Potassium: 4.2 mmol/L (ref 3.5–5.1)
Sodium: 138 mmol/L (ref 135–145)

## 2019-10-20 LAB — CBC WITH DIFFERENTIAL/PLATELET
Abs Immature Granulocytes: 0.08 10*3/uL — ABNORMAL HIGH (ref 0.00–0.07)
Basophils Absolute: 0.1 10*3/uL (ref 0.0–0.1)
Basophils Relative: 0 %
Eosinophils Absolute: 0.4 10*3/uL (ref 0.0–0.5)
Eosinophils Relative: 3 %
HCT: 25.6 % — ABNORMAL LOW (ref 36.0–46.0)
Hemoglobin: 8.8 g/dL — ABNORMAL LOW (ref 12.0–15.0)
Immature Granulocytes: 1 %
Lymphocytes Relative: 8 %
Lymphs Abs: 1.1 10*3/uL (ref 0.7–4.0)
MCH: 29.9 pg (ref 26.0–34.0)
MCHC: 34.4 g/dL (ref 30.0–36.0)
MCV: 87.1 fL (ref 80.0–100.0)
Monocytes Absolute: 1.1 10*3/uL — ABNORMAL HIGH (ref 0.1–1.0)
Monocytes Relative: 8 %
Neutro Abs: 11.2 10*3/uL — ABNORMAL HIGH (ref 1.7–7.7)
Neutrophils Relative %: 80 %
Platelets: 263 10*3/uL (ref 150–400)
RBC: 2.94 MIL/uL — ABNORMAL LOW (ref 3.87–5.11)
RDW: 15.3 % (ref 11.5–15.5)
WBC: 14 10*3/uL — ABNORMAL HIGH (ref 4.0–10.5)
nRBC: 0.1 % (ref 0.0–0.2)

## 2019-10-20 LAB — RENAL FUNCTION PANEL
Albumin: 3 g/dL — ABNORMAL LOW (ref 3.5–5.0)
Anion gap: 15 (ref 5–15)
BUN: 77 mg/dL — ABNORMAL HIGH (ref 8–23)
CO2: 23 mmol/L (ref 22–32)
Calcium: 9 mg/dL (ref 8.9–10.3)
Chloride: 100 mmol/L (ref 98–111)
Creatinine, Ser: 9.6 mg/dL — ABNORMAL HIGH (ref 0.44–1.00)
GFR calc Af Amer: 5 mL/min — ABNORMAL LOW (ref >60)
GFR calc non Af Amer: 4 mL/min — ABNORMAL LOW (ref >60)
Glucose, Bld: 157 mg/dL — ABNORMAL HIGH (ref 70–99)
Phosphorus: 2.9 mg/dL (ref 2.5–4.6)
Potassium: 3.9 mmol/L (ref 3.5–5.1)
Sodium: 138 mmol/L (ref 135–145)

## 2019-10-20 LAB — GLUCOSE, CAPILLARY
Glucose-Capillary: 194 mg/dL — ABNORMAL HIGH (ref 70–99)
Glucose-Capillary: 150 mg/dL — ABNORMAL HIGH (ref 70–99)

## 2019-10-20 LAB — PROCALCITONIN: Procalcitonin: 0.79 ng/mL

## 2019-10-20 MED ORDER — ALBUTEROL SULFATE (2.5 MG/3ML) 0.083% IN NEBU: 2.5000 mg | INHALATION_SOLUTION | RESPIRATORY_TRACT | Status: DC | PRN

## 2019-10-20 MED ORDER — IPRATROPIUM-ALBUTEROL 0.5-2.5 (3) MG/3ML IN SOLN
3.0000 mL | Freq: Three times a day (TID) | RESPIRATORY_TRACT | Status: DC
  Administered 2019-10-20 – 2019-10-24 (×11): 3 mL via RESPIRATORY_TRACT
  Filled 2019-10-20 (×11): qty 3

## 2019-10-20 MED ORDER — ALBUTEROL SULFATE (2.5 MG/3ML) 0.083% IN NEBU
2.5000 mg | INHALATION_SOLUTION | Freq: Three times a day (TID) | RESPIRATORY_TRACT | Status: DC
  Administered 2019-10-20: 2.5 mg via RESPIRATORY_TRACT
  Filled 2019-10-20: qty 3

## 2019-10-20 MED ORDER — MIDODRINE HCL 5 MG PO TABS: 5.0000 mg | ORAL_TABLET | Freq: Every day | ORAL | Status: DC | PRN

## 2019-10-20 NOTE — Progress Notes (Signed)
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    Subjective   Less sob but still dyspnaic  Inpatient Medications    . amiodarone  200 mg Oral Daily  . apixaban  2.5 mg Oral BID  . aspirin EC  81 mg Oral Daily  . atorvastatin  80 mg Oral q1800  . carvedilol  25 mg Oral BID  . Chlorhexidine Gluconate Cloth  6 each Topical Q0600  . epoetin (EPOGEN/PROCRIT) injection  10,000 Units Intravenous Q M,W,F-HD  . furosemide  40 mg Intravenous Q12H  . gabapentin  100 mg Oral BID  . guaiFENesin  600 mg Oral BID  . insulin aspart  0-9 Units Subcutaneous TID WC  . insulin glargine  14 Units Subcutaneous QHS  . ipratropium-albuterol  3 mL Nebulization TID  . Melatonin  5 mg Oral QHS  . midodrine  5 mg Oral Once per day on Mon Wed Fri  . pantoprazole  40 mg Oral BID  . polyethylene glycol  17 g Oral Daily  . senna-docusate  2 tablet Oral QHS  . sevelamer carbonate  1,600 mg Oral TID WC  . sodium chloride flush  3 mL Intravenous Q12H    Vital Signs    Vitals:   10/19/19 2111 10/20/19 0503 10/20/19 0752 10/20/19 0755  BP:  (!) 160/74 (!) 153/70   Pulse:  76 72 72  Resp:  18 20 18   Temp:  99.3 F (37.4 C) 98.8 F (37.1 C)   TempSrc:  Oral Oral   SpO2: 95% 97% 100% 98%  Weight:  65.7 kg    Height:        Intake/Output Summary (Last 24 hours) at 10/20/2019 1238 Last data  filed at 10/20/2019 0400 Gross per 24 hour  Intake 420.08 ml  Output -  Net 420.08 ml   Filed Weights   10/18/19 0450 10/19/19 0258 10/20/19 0503  Weight: 63 kg 64 kg 65.7 kg    Physical Exam    GEN: Well nourished, well developed, in no acute distress.  HEENT: normal.  Neck: tracheostomy in place. Cardiac: RRR, no murmurs, rubs, or gallops. No clubbing, cyanosis, edema.  Respiratory:  Respirations regular and unlabored, clear to auscultation bilaterally. GI: Soft, nontender, nondistended, BS + x 4. MS: no deformity or atrophy. Skin: warm and dry, no rash. Neuro:  Strength and sensation are intact. Psych: Normal affect.  Labs    CBC Recent Labs    10/19/19 0553 10/20/19 0523  WBC 13.2* 14.0*  NEUTROABS  --  11.2*  HGB 8.3* 8.8*  HCT 25.4* 25.6*  MCV 90.4 87.1  PLT 240 [id number]   Basic Metabolic Panel Recent Labs    10/19/19 0553 10/20/19 0523  NA 135 138  K 4.1 4.2  CL 97* 100  CO2 27 24  GLUCOSE 190* 212*  BUN 49* 70*  CREATININE 7.46* 8.66*  CALCIUM 8.3* 9.0   Liver Function Tests No results for input(s):  AST, ALT, ALKPHOS, BILITOT, PROT, ALBUMIN in the last 72 hours. No results for input(s): LIPASE, AMYLASE in the last 72 hours. Cardiac Enzymes No results for input(s): CKTOTAL, CKMB, CKMBINDEX, TROPONINI in the last 72 hours. BNP Recent Labs    10/18/19 0517  BNP 796.0*   D-Dimer No results for input(s): DDIMER in the last 72 hours. Hemoglobin A1C No results for input(s): HGBA1C in the last 72 hours. Fasting Lipid Panel No results for input(s): CHOL, HDL, LDLCALC, TRIG, CHOLHDL, LDLDIRECT in the last 72 hours. Thyroid Function Tests No results for input(s): TSH, T4TOTAL, T3FREE, THYROIDAB in the last 72 hours.  Invalid input(s): FREET3  Telemetry    nsr  ECG    nsr with no ischemia  Radiology    Dg Chest Port 1 View  Result Date: 10/19/2019 CLINICAL DATA:  Community acquired pneumonia EXAM: PORTABLE CHEST 1 VIEW COMPARISON:   10/17/2019 FINDINGS: Tip of tracheostomy tube is above the carina. Previous median sternotomy and mitral valvuloplasty. Mild cardiac enlargement. Bilateral pleural effusions and increased interstitial edema is noted. Atelectasis and/or airspace disease noted in the left lower lobe and right lung base. IMPRESSION: 1. Mild CHF. 2. Bilateral lower lobe opacities which may represent atelectasis and/or pneumonia. Electronically Signed   By: Kerby Moors M.D.   On: 10/19/2019 11:33   Dg Chest Portable 1 View  Result Date: 10/17/2019 CLINICAL DATA:  Shortness of breath. Hypoxia. Dialysis patient. EXAM: PORTABLE CHEST 1 VIEW COMPARISON:  Two-view chest x-ray 10/01/2017 FINDINGS: Chronic tracheostomy is noted. The heart is enlarged. Mild pulmonary vascular congestion is present. Bibasilar airspace disease is new. Bilateral pleural effusions are present. There is no significant consolidation in the upper lobes. The visualized soft tissues and bony thorax are unremarkable. IMPRESSION: 1. Cardiomegaly with mild pulmonary vascular congestion and bilateral pleural effusions compatible with congestive heart failure. 2. Bibasilar airspace disease may represent atelectasis, but is concerning for bibasilar pneumonia. Electronically Signed   By: San Morelle M.D.   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 Improved after hemodialysis.    1. CHF-has HFpEF-ef normal ef by [facility] at [facility] in December of last year.Will attempt to  control with iv lasix 40 bid. Carvedilol 25 mg bid.  Echo showed ejection fraction of 55 to 60% with moderate LVH.  RV function is normal.  Mild to moderate sclerosis of the aortic valve with no aortic stenosis.  Estimated RV systolic pressure of [id number] mmHg.  BNP is improved to 796 from 1350 on admission.     2. Paroxysmal afib-is on apixiban 2.5 mg bid and amiodarone200 mgdaily. Currently in nsr. Will continue with this regimen.   3. Cad/mitral valve disease. -Had ruptured posterior papillary muscle with cardiogenic shock in 2017 . Had inferior posterior stemi in 2013 with a des to [place]. Underwent emergent mvr with a tissue prosthesis due to ruptured posterior papillary muscle. Post op course complicated by ckd and prolonged intubation requiring trach. Currently appears stable from ischemic [place].  Surgical intervention of the mitral valve appears well intact.  Conitnue with current outpatient regimen for now. Includes asa  81 mg daily and atorvastatin at 80 mg daily.   4.HTN-currently controlled after hd.  Continue with carvedilol at 25 mg dialy and follow.   5. Hyperlipidemia-conitnue with atorvastatin.   6. DM-insultin as is being done.  Signed, Javier Docker Jamiee Milholland MD 10/20/2019, 12:38 PM  Pager: [phone]

## 2019-10-20 NOTE — Evaluation (Signed)
Physical Therapy Evaluation Patient Details Name: Deborah [name] MRN: [id number] DOB: 07-02-1956 Today's Date: 10/20/2019   History of Present Illness  Per MD H&P: Pt is a 63 y.o. African-American female with a known history of type II arrhythmias, hypertension, dyslipidemia and end-stage renal disease on hemodialysis on M,W,F, who presented to the emergency room with acute onset of worsening dyspnea and hypoxia with a pulse ox entry of 81% on room air.  Patient is status post tracheostomy but is not on home O2.  She has been having dry cough here occasionally productive of clear sputum at home.  She admitted to orthopnea and paroxysmal nocturnal dyspnea.  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    Clinical Impression  Pt presented with deficits in strength, transfers, mobility, gait, balance, and activity tolerance.  Pt was Mod Ind with bed mobility tasks and SBA with transfers performing both with slow, deliberate movements.  Pt used a RW to assist with amb at the EOB with amb distance somewhat limited by the airflow tubing to her trach but with pt also reporting feeling weaker than her baseline.  Pt's SpO2 on 6LO2/min was 90-91% at rest and 96% after amb with HR WNL.  Pt will benefit from HHPT services upon discharge to safely address above deficits for decreased caregiver assistance and eventual return to PLOF.        Follow Up Recommendations Home health PT;Supervision - Intermittent    Equipment Recommendations  None recommended by PT    Recommendations for Other Services       Precautions / Restrictions Precautions Precautions: Fall Restrictions Weight Bearing Restrictions: No      Mobility  Bed Mobility Overal bed mobility: Modified Independent             General bed mobility comments:  Extra time and effort only without use of rail  Transfers Overall transfer level: Needs assistance Equipment used: Rolling walker (2 wheeled) Transfers: Sit to/from Stand Sit to Stand: Supervision         General transfer comment: Good eccentric and concentric control  Ambulation/Gait Ambulation/Gait assistance: Min guard Gait Distance (Feet): 15 Feet Assistive device: Rolling walker (2 wheeled) Gait Pattern/deviations: Step-through pattern;Decreased step length - right;Decreased step length - left Gait velocity: decreased   General Gait Details: Pt steady with amb with amb at EOB only secondary to cannula tubing  Stairs            Wheelchair Mobility    Modified Rankin (Stroke Patients Only)       Balance Overall balance assessment: Needs assistance Sitting-balance support: No upper extremity supported;Feet supported Sitting balance-Leahy Scale: Normal     Standing balance support: Bilateral upper extremity supported;During functional activity Standing balance-Leahy Scale: Good                               Pertinent Vitals/Pain Pain Assessment: No/denies pain    Home Living Family/patient expects to be discharged to:: Private residence Living Arrangements: Children Available Help at Discharge: Personal care attendant;Family;Available 24 hours/day Type of Home: Apartment Home Access: Stairs to enter Entrance Stairs-Rails: Left Entrance Stairs-Number of Steps: 8 Home Layout: One level Home Equipment: Walker - 2 wheels;Walker - 4 wheels;Grab bars - toilet;Cane - quad;Cane - single point  Prior Function Level of Independence: Independent         Comments: Ind Amb without an AD limited community distances, Ind with ADLs, no fall history     Hand Dominance        Extremity/Trunk Assessment   Upper Extremity Assessment Upper Extremity Assessment: Generalized weakness    Lower Extremity Assessment Lower Extremity Assessment:  Generalized weakness       Communication   Communication: Tracheostomy  Cognition Arousal/Alertness: Awake/alert Behavior During Therapy: WFL for tasks assessed/performed Overall Cognitive Status: Within Functional Limits for tasks assessed                                        General Comments      Exercises Total Joint Exercises Ankle Circles/Pumps: Strengthening;Both;10 reps Quad Sets: Strengthening;Both;10 reps Gluteal Sets: Strengthening;Both;10 reps Towel Squeeze: Strengthening;Both;10 reps Hip ABduction/ADduction: AROM;Both;10 reps Long Arc Quad: Strengthening;Both;10 reps Marching in Standing: AROM;Both;10 reps;Seated;Standing Other Exercises Other Exercises: HEP education for BLE APs, QS, GS, and LAQs x 10 each every 1-2 hrs   Assessment/Plan    PT Assessment Patient needs continued PT services  PT Problem List Decreased strength;Decreased activity tolerance;Decreased balance       PT Treatment Interventions DME instruction;Gait training;Therapeutic activities;Functional mobility training;Stair training;Therapeutic exercise;Balance training;Patient/family education    PT Goals (Current goals can be found in the Care Plan section)  Acute Rehab PT Goals Patient Stated Goal: To get stronger PT Goal Formulation: With patient Time For Goal Achievement: 11/02/19 Potential to Achieve Goals: Good    Frequency Min 2X/week   Barriers to discharge        Co-evaluation               AM-PAC PT "6 Clicks" Mobility  Outcome Measure Help needed turning from your back to your side while in a flat bed without using bedrails?: None Help needed moving from lying on your back to sitting on the side of a flat bed without using bedrails?: None Help needed moving to and from a bed to a chair (including a wheelchair)?: A Little Help needed standing up from a chair using your arms (e.g., wheelchair or bedside chair)?: A Little Help needed to walk in  hospital room?: A Little Help needed climbing 3-5 steps with a railing? : A Little 6 Click Score: 20    End of Session Equipment Utilized During Treatment: Gait belt;Oxygen Activity Tolerance: Patient tolerated treatment well Patient left: in bed;with call bell/phone within reach;with bed alarm set Nurse Communication: Mobility status PT Visit Diagnosis: Muscle weakness (generalized) (M62.81);Difficulty in walking, not elsewhere classified (R26.2)    Time: [id number] PT Time Calculation (min) (ACUTE ONLY): 32 min   Charges:   PT Evaluation $PT Eval Moderate Complexity: 1 Mod PT Treatments $Therapeutic Exercise: 8-22 mins        D. Scott Jaslyn Bansal PT, DPT 10/20/19, 1:28 PM

## 2019-10-20 NOTE — Progress Notes (Signed)
HD T x Started   10/20/19 1512  Vital Signs  Pulse Rate 70  BP (!) 164/73  Oxygen Therapy  SpO2 96 %  O2 Device Tracheostomy Collar  O2 Flow Rate (L/min) 6 L/min  Pain Assessment  Pain Scale 0-10  Pain Score 0  During Hemodialysis Assessment  Blood Flow Rate (mL/min) 400 mL/min  Arterial Pressure (mmHg) -90 mmHg  Venous Pressure (mmHg) 120 mmHg  Transmembrane Pressure (mmHg) 80 mmHg  Ultrafiltration Rate (mL/min) 1000 mL/min  Dialysate Flow Rate (mL/min) 600 ml/min  Conductivity: Machine  13.6  HD Safety Checks Performed Yes  Intra-Hemodialysis Comments Tx initiated     10/20/19 1512  Vital Signs  Pulse Rate 70  BP (!) 164/73  Oxygen Therapy  SpO2 96 %  O2 Device Tracheostomy Collar  O2 Flow Rate (L/min) 6 L/min  Pain Assessment  Pain Scale 0-10  Pain Score 0  During Hemodialysis Assessment  Blood Flow Rate (mL/min) 400 mL/min  Arterial Pressure (mmHg) -90 mmHg  Venous Pressure (mmHg) 120 mmHg  Transmembrane Pressure (mmHg) 80 mmHg  Ultrafiltration Rate (mL/min) 1000 mL/min  Dialysate Flow Rate (mL/min) 600 ml/min  Conductivity: Machine  13.6  HD Safety Checks Performed Yes  Intra-Hemodialysis Comments Tx initiated

## 2019-10-20 NOTE — Plan of Care (Signed)
?  Problem: Fluid Volume: ?Goal: Compliance with measures to maintain balanced fluid volume will improve ?Outcome: Progressing ?  ?Problem: Clinical Measurements: ?Goal: Complications related to the disease process, condition or treatment will be avoided or minimized ?Outcome: Progressing ?  ?

## 2019-10-20 NOTE — Progress Notes (Signed)
[facility]  ROUNDING NOTE   Subjective:   Denies any acute complaints Currently requiring high flow oxygen   Objective:  Vital signs in last 24 hours:  Temp:  [98.8 F (37.1 C)-99.3 F (37.4 C)] 98.8 F (37.1 C) (11/16 0752) Pulse Rate:  [72-81] 72 (11/16 0755) Resp:  [16-20] 18 (11/16 0755) BP: (153-168)/(70-74) 153/70 (11/16 0752) SpO2:  [94 %-100 %] 98 % (11/16 0755) FiO2 (%):  [28 %] 28 % (11/16 0755) Weight:  [65.7 kg] 65.7 kg (11/16 0503)  Weight change: 1.633 kg Filed Weights   10/18/19 0450 10/19/19 0258 10/20/19 0503  Weight: 63 kg 64 kg 65.7 kg    Intake/Output: I/O last 3 completed shifts: In: 949.9 [IV Piggyback:949.9] Out: -    Intake/Output this shift:  No intake/output data recorded.  Physical Exam: General: Sitting up in bed  Head: Normocephalic, atraumatic. Moist oral mucosal membranes  Eyes: Anicteric,  Neck: +tracheostomy  Lungs:  Coarse bilateral breath sounds.  Left basilar crackles  Heart: Regular rate and rhythm  Abdomen:  Soft, nontender  Extremities:  trace peripheral edema.  Neurologic: Nonfocal, able to follow simple commands  Skin: No lesions  Access: Left AVG    Basic Metabolic Panel: Recent Labs  Lab 10/17/19 0445 10/18/19 0517 10/19/19 0553 10/20/19 0523  NA 140 138 135 138  K 3.8 4.2 4.1 4.2  CL 98 96* 97* 100  CO2 27 31 27 24   GLUCOSE 166* 179* 190* 212*  BUN 49* 28* 49* 70*  CREATININE 6.47* 5.34* 7.46* 8.66*  CALCIUM 8.9 8.7* 8.3* 9.0  MG 1.8  --   --   --     Liver Function Tests: No results for input(s): AST, ALT, ALKPHOS, BILITOT, PROT, ALBUMIN in the last 168 hours. No results for input(s): LIPASE, AMYLASE in the last 168 hours. No results for input(s): AMMONIA in the last 168 hours.  CBC: Recent Labs  Lab 10/17/19 0445 10/18/19 0537 10/19/19 0553 10/20/19 0523  WBC 12.7* 12.6* 13.2* 14.0*  NEUTROABS 9.7*  --   --  11.2*  HGB 9.4* 9.0* 8.3* 8.8*  HCT 28.4* 26.4* 25.4* 25.6*  MCV  89.3 87.4 90.4 87.1  PLT 250 236 240 263    Cardiac Enzymes: No results for input(s): CKTOTAL, CKMB, CKMBINDEX, TROPONINI in the last 168 hours.  BNP: Invalid input(s): POCBNP  CBG: Recent Labs  Lab 10/19/19 0742 10/19/19 1219 10/19/19 1730 10/19/19 2112 10/20/19 0752  GLUCAP 180* 189* 153* 130* 194*    Microbiology: Results for orders placed or performed during the hospital encounter of 10/17/19  SARS CORONAVIRUS 2 (TAT 6-24 HRS) Nasopharyngeal Nasopharyngeal Swab     Status: None   Collection Time: 10/17/19  4:45 AM   Specimen: Nasopharyngeal Swab  Result Value Ref Range Status   SARS Coronavirus 2 NEGATIVE NEGATIVE Final    Comment: (NOTE) SARS-CoV-2 target nucleic acids are NOT DETECTED. The SARS-CoV-2 RNA is generally detectable in upper and lower respiratory specimens during the acute phase of infection. Negative results do not preclude SARS-CoV-2 infection, do not rule out co-infections with other pathogens, and should not be used as the sole basis for treatment or other patient management decisions. Negative results must be combined with clinical observations, patient history, and epidemiological information. The expected result is Negative. Fact Sheet for Patients: SugarRoll.be Fact Sheet for Healthcare Providers: https://www.woods-mathews.com/ This test is not yet approved or cleared by the [place] FDA and  has been authorized for detection and/or diagnosis of SARS-CoV-2 by  FDA under an Emergency Use Authorization (EUA). This EUA will remain  in effect (meaning this test can be used) for the duration of the COVID-19 declaration under Section 56 4(b)(1) of the Act, 21 U.S.C. section 360bbb-3(b)(1), unless the authorization is terminated or revoked sooner. Performed at [facility], [place] [address], [place], [place] [address]   Culture, respiratory     Status: None (Preliminary result)   Collection Time:  10/17/19 12:42 PM   Specimen: Tracheal Aspirate  Result Value Ref Range Status   Specimen Description   Final    TRACHEAL ASPIRATE Performed at [facility], [address], [place], [place] [address]    Special Requests   Final    NONE Performed at [facility], [address] [place], [place] [address]    Gram Stain   Final    ABUNDANT WBC PRESENT, PREDOMINANTLY PMN ABUNDANT GRAM POSITIVE RODS FEW GRAM POSITIVE COCCI    Culture   Final    FEW KLEBSIELLA OXYTOCA CULTURE REINCUBATED FOR BETTER GROWTH Performed at [facility], [place] [address] [place], [place] [address]    Report Status PENDING  Incomplete   Organism ID, Bacteria KLEBSIELLA OXYTOCA  Final      Susceptibility   Klebsiella oxytoca - MIC*    AMPICILLIN >=32 RESISTANT Resistant     CEFAZOLIN 16 SENSITIVE Sensitive     CEFEPIME <=1 SENSITIVE Sensitive     CEFTAZIDIME <=1 SENSITIVE Sensitive     CEFTRIAXONE <=1 SENSITIVE Sensitive     CIPROFLOXACIN <=0.25 SENSITIVE Sensitive     GENTAMICIN <=1 SENSITIVE Sensitive     IMIPENEM <=0.25 SENSITIVE Sensitive     TRIMETH/SULFA <=20 SENSITIVE Sensitive     AMPICILLIN/SULBACTAM 16 INTERMEDIATE Intermediate     PIP/TAZO <=4 SENSITIVE Sensitive     Extended ESBL NEGATIVE Sensitive     * FEW KLEBSIELLA OXYTOCA  CULTURE, BLOOD (ROUTINE X 2) w Reflex to ID Panel     Status: None (Preliminary result)   Collection Time: 10/17/19 12:50 PM   Specimen: BLOOD  Result Value Ref Range Status   Specimen Description BLOOD PORTA CATH  Final   Special Requests   Final    BOTTLES DRAWN AEROBIC AND ANAEROBIC Blood Culture results may not be optimal due to an excessive volume of blood received in culture bottles   Culture   Final    NO GROWTH 3 DAYS Performed at [facility], [address] [place], [place] [address]    Report Status PENDING  Incomplete  CULTURE, BLOOD (ROUTINE X 2) w Reflex to ID Panel     Status: None (Preliminary result)    Collection Time: 10/17/19  2:20 PM   Specimen: BLOOD  Result Value Ref Range Status   Specimen Description BLOOD RT HAND  Final   Special Requests   Final    BOTTLES DRAWN AEROBIC AND ANAEROBIC Blood Culture results may not be optimal due to an inadequate volume of blood received in culture bottles   Culture   Final    NO GROWTH 3 DAYS Performed at [facility], [address] [place], [place] [address]    Report Status PENDING  Incomplete  MRSA PCR Screening     Status: None   Collection Time: 10/17/19  9:50 PM   Specimen: Nasopharyngeal  Result Value Ref Range Status   MRSA by PCR NEGATIVE NEGATIVE Final    Comment:        The GeneXpert MRSA Assay (  FDA approved for NASAL specimens only), is one component of a comprehensive MRSA colonization surveillance program. It is not intended to diagnose MRSA infection nor to guide or monitor treatment for MRSA infections. Performed at [facility], [address] [place], [place] [address]   Culture, respiratory     Status: None (Preliminary result)   Collection Time: 10/18/19  2:45 PM   Specimen: Tracheal Aspirate  Result Value Ref Range Status   Specimen Description   Final    TRACHEAL ASPIRATE Performed at [facility], [address] [place], [place] [address]    Special Requests   Final    NONE Performed at [facility], [place], [place] [address]    Gram Stain   Final    ABUNDANT WBC PRESENT,BOTH PMN AND MONONUCLEAR FEW GRAM POSITIVE COCCI FEW GRAM POSITIVE RODS FEW GRAM NEGATIVE RODS    Culture   Final    FEW GRAM NEGATIVE RODS IDENTIFICATION AND SUSCEPTIBILITIES TO FOLLOW Performed at [facility], [place] [address] [place], [place] [address]    Report Status PENDING  Incomplete    Coagulation Studies: No results for input(s): LABPROT, INR in the last 72 hours.  Urinalysis: No results for input(s): COLORURINE, LABSPEC, PHURINE, GLUCOSEU, HGBUR,  BILIRUBINUR, KETONESUR, PROTEINUR, UROBILINOGEN, NITRITE, LEUKOCYTESUR in the last 72 hours.  Invalid input(s): APPERANCEUR    Imaging: Dg Chest Port 1 View  Result Date: 10/19/2019 CLINICAL DATA:  Community acquired pneumonia EXAM: PORTABLE CHEST 1 VIEW COMPARISON:  10/17/2019 FINDINGS: Tip of tracheostomy tube is above the carina. Previous median sternotomy and mitral valvuloplasty. Mild cardiac enlargement. Bilateral pleural effusions and increased interstitial edema is noted. Atelectasis and/or airspace disease noted in the left lower lobe and right lung base. IMPRESSION: 1. Mild CHF. 2. Bilateral lower lobe opacities which may represent atelectasis and/or pneumonia. Electronically Signed   By: Kerby Moors M.D.   On: 10/19/2019 11:33     Medications:   . sodium chloride 250 mL (10/18/19 0621)  . azithromycin 500 mg (10/20/19 0552)  . cefTRIAXone (ROCEPHIN)  IV 2 g (10/20/19 0919)   . amiodarone  200 mg Oral Daily  . apixaban  2.5 mg Oral BID  . aspirin EC  81 mg Oral Daily  . atorvastatin  80 mg Oral q1800  . carvedilol  25 mg Oral BID  . Chlorhexidine Gluconate Cloth  6 each Topical Q0600  . epoetin (EPOGEN/PROCRIT) injection  10,000 Units Intravenous Q M,W,F-HD  . furosemide  40 mg Intravenous Q12H  . gabapentin  100 mg Oral BID  . guaiFENesin  600 mg Oral BID  . insulin aspart  0-9 Units Subcutaneous TID WC  . insulin glargine  14 Units Subcutaneous QHS  . ipratropium-albuterol  3 mL Nebulization TID  . Melatonin  5 mg Oral QHS  . midodrine  5 mg Oral Once per day on Mon Wed Fri  . pantoprazole  40 mg Oral BID  . polyethylene glycol  17 g Oral Daily  . senna-docusate  2 tablet Oral QHS  . sevelamer carbonate  1,600 mg Oral TID WC  . sodium chloride flush  3 mL Intravenous Q12H   sodium chloride, acetaminophen, albuterol, ALPRAZolam, chlorpheniramine-HYDROcodone, labetalol, labetalol, ondansetron (ZOFRAN) IV, oxyCODONE, QUEtiapine, sodium chloride flush,  zolpidem  Assessment/ Plan:  [name] is a 63 y.o. black female with end stage renal disease on hemodialysis, tracheostomy, hypotension, diabetes mellitus type II insulin dependent, diabetic neuropathy,  coronary artery disease,  mitral valve repair, atrial flutter on apixaban  [facility] MWF [facility] left AVG Chronic tracheostomy  1. End Stage Renal Disease: with pulmonary edema - emergent dialysis on admission - Continue MWF schedule  2. Acute hypoxic Respiratory failure: requiring oxygen. COVID-19 negative chronic tracheostomy - empiric antibiotics. - Klebsiella pneumonia.  Antibiotics as per internal medicine team  3. Hypertension: usually requires midodrine before dialysis. However with elevated blood pressure today - midodrine before dialysis as needed for hypotension  4. Anemia of chronic kidney disease:   Lab Results  Component Value Date   HGB 8.8 (L) 10/20/2019   - EPO with HD treatments.  5. Secondary Hyperparathyroidism: Lab Results  Component Value Date   PTH 270 (H) 10/03/2017   CALCIUM 9.0 10/20/2019   CAION 1.09 (L) 09/01/2016   PHOS 3.5 10/03/2017   - sevelamer with meals     LOS: 3 Peggye Poon 11/16/202010:55 AM

## 2019-10-20 NOTE — Progress Notes (Signed)
Pre HD Note    10/20/19 1510  Hand-Off documentation  Report given to (Full Name) Newt Minion RN   Report received from (Full Name) Clinton Sawyer Chritsmas RN   Vital Signs  Temp 98.6 F (37 C)  Temp Source Oral  Pulse Rate 70  Pulse Rate Source Monitor  Resp 18  BP (!) 164/73  BP Location Right Arm  BP Method Automatic  Patient Position (if appropriate) Lying  Oxygen Therapy  SpO2 97 %  O2 Device Tracheostomy Collar  O2 Flow Rate (L/min) 6 L/min  Pain Assessment  Pain Scale 0-10  Pain Score 0  Time-Out for Hemodialysis  What Procedure? HD   Pt Identifiers(min of two) First/Last Name;MRN/Account#  Correct Site? Yes  Correct Side? Yes  Correct Procedure? Yes  Consents Verified? Yes  Safety Precautions Reviewed? Yes  Engineer, civil (consulting) Number 7  Station Number 1  UF/Alarm Test Passed  Conductivity: Meter 13.8  Conductivity: Machine  13.9  pH 7.4  Reverse Osmosis Main   Normal Saline Lot Number [id number]  Dialyzer Lot Number [id number]  Disposable Set Lot Number [id number]  Machine Temperature 98.6 F (37 C)  Musician and Audible Yes  Blood Lines Intact and Secured Yes  Pre Treatment Patient Checks  Vascular access used during treatment Fistula  HD catheter dressing before treatment WDL  Patient is receiving dialysis in a chair  (In bed)  Hepatitis B Surface Antigen Results Negative  Date Hepatitis B Surface Antigen Drawn 10/08/19  Hepatitis B Surface Antibody  (<10)  Date Hepatitis B Surface Antibody Drawn 07/09/19  Hemodialysis Consent Verified Yes  Hemodialysis Standing Orders Initiated Yes  ECG (Telemetry) Monitor On Yes  Prime Ordered Normal Saline  Length of  DialysisTreatment -hour(s) 3.5 Hour(s)  Dialysis Treatment Comments  (Na140)  Dialyzer Elisio 17H NR  Dialysate 2K;2.5 Ca  Dialysis Anticoagulant None  Blood Flow Rate Ordered 400 mL/min  Ultrafiltration Goal 3 Liters  Pre Treatment Labs Renal panel;CBC  Dialysis Blood Pressure  Support Ordered Albumin  Fistula / Graft Left Upper arm  Placement Date: 10/02/16   Placed prior to admission: No  Orientation: Left  Access Location: Upper arm  Site Condition No complications  Fistula / Graft Assessment Present;Thrill;Bruit  Status Accessed  Needle Size 15  Drainage Description None

## 2019-10-20 NOTE — Care Management Important Message (Signed)
Important Message  Patient Details  Name: SYLVA RUSHER MRN: [id number] Date of Birth: [birth year]   Medicare Important Message Given:  Yes     Dannette Barbara 10/20/2019, 12:19 PM

## 2019-10-20 NOTE — Progress Notes (Signed)
Pre hd tx assessment   10/20/19 1510  Neurological  Level of Consciousness Alert  Orientation Level Oriented X4  Respiratory  Respiratory Pattern Regular;Unlabored  Chest Assessment Chest expansion symmetrical  Bilateral Breath Sounds Clear;Diminished  R Upper  Breath Sounds Clear  L Upper Breath Sounds Clear  R Lower Breath Sounds Clear;Diminished  L Lower Breath Sounds Clear;Diminished  Cough None  Tracheostomy Shiley 6 mm Uncuffed  Placement Date/Time: 11/09/16 1500   Placed By: (c) Other (Comment)  Brand: Shiley  Size (mm): (c) 6 mm  Style: Uncuffed  Status Secured  Site Assessment Clean;Dry  Ties Assessment Dry;Clean  Tracheostomy Equipment at bedside Yes and checklist posted at head of bed  Cardiac  Pulse Regular  Heart Sounds S1, S2  Jugular Venous Distention (JVD) No  ECG Monitor Yes  Cardiac Rhythm NSR  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

## 2019-10-20 NOTE — Progress Notes (Signed)
Post HD Tx Assessment   10/20/19 1830  Neurological  Level of Consciousness Alert  Orientation Level Oriented X4  Respiratory  Respiratory Pattern Regular;Unlabored  Chest Assessment Chest expansion symmetrical  Bilateral Breath Sounds Clear;Diminished  Tracheostomy Shiley 6 mm Uncuffed  Placement Date/Time: 11/09/16 1500   Placed By: (c) Other (Comment)  Brand: Shiley  Size (mm): (c) 6 mm  Style: Uncuffed  Status Secured  Site Assessment Clean;Dry  Ties Assessment Dry;Clean  Tracheostomy Equipment at bedside Yes and checklist posted at head of bed  Cardiac  Pulse Regular  Heart Sounds S1, S2  Jugular Venous Distention (JVD) No  ECG Monitor Yes  Cardiac Rhythm NSR  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

## 2019-10-20 NOTE — Progress Notes (Signed)
PROGRESS NOTE  Deborah [name] [id number] DOB: 02-01-1956 DOA: 10/17/2019 PCP: System, Pcp Not In  HPI/Recap of past 24 hours: [name] a63 y.o.African-American femalewith a known history of type II arrhythmias, hypertension, dyslipidemia and end-stage renal disease on hemodialysis on Monday Wednesday Friday, who presented to the emergency room with acute onset of worsening dyspnea and hypoxia with a pulse ox entry of 81% on room air. Patient is status post tracheostomy but is not on home O2. She has been having dry cough here occasionally productive of clear sputum at home. She admitted to orthopnea and paroxysmal nocturnal dyspnea. She had no fever or chills.No worsening lower extremity edema. No headache or dizziness or blurred vision. No nausea vomiting or abdominal pain. She did not miss any hemodialysis session.  Upon presentation to the emergency room, blood pressure was 184/95and later 200/89with pulse currently of 96% on her percent nonrebreather and a temperature of 99.2. Labs revealed mild cytosis with WBC of 12.7 and neutrophilia. ANC/ALC ratio was 9.7/1.4. BNP was 1350 and high-sensitivity troponin I of 37. CMP showed BUN of 49 creatinine 6.47. Blood cultures were ordered.  The patient was given IV cefepime and vancomycin as well as IV nitroglycerin drip. She will be admitted to a telemetry bed for further evaluation and management.  Patient was seen and examined at her bedside in the ED.  Reports dyspnea with minimal exertion worsened with ambulation.  Also reports pleuritic pain with nonproductive cough.  COVID-19 test in process.  Plan for HD today.  Last HD was on Wednesday, 10/15/2019.  Independently viewed chest x-ray done on admission which showed cardiomegaly with increase in pulmonary vascularity and bilateral pleural effusions.  BNP elevated greater than 1300.  Last 2D echo done in 2018 showed normal LVEF 55 to 60%.  A repeat TTE is pending.   10/19/19: Patient was seen and examined at her bedside this morning.  She states she feels better.  Cough is improved.  She is on Rocephin and azithromycin for Klebsiella oxytoca pneumonia.  Awaiting sensitivities.  Will obtain home O2 evaluation for DC planning.  10/20/19: seen and examined at bedside. Dyspnea with ambulation. No chest pain. CXR with pulmonary edema and small right pleural effusion.   Assessment/Plan: Active Problems:   Acute CHF (congestive heart failure) ([name])  Acute on chronic diastolic CHF with subsequent acute hypoxic respiratory failure.  Patient's last EF was 55 to 60% in 2018.  Hemodialyzed on 10/17/2019 with improvement of symptomatology. Seen by cardiology, following. Medications as recommended by cardiology. 2D echo done on 10/18/2019 shows normal LVEF.  Reviewed by cardiology.  No changes.  Will need to follow-up with her cardiologist outpatient.  Resolving acute hypoxic respiratory failure likely 2/2 to pulmonary edema suspect infective in the setting of PNA Continue volume management at hemodialysis Independently reviewed CXR done on 10/19/19 which showed b/l patchy infiltrated and R pleural effusion. Maintain O2 sat >92%  Klebsiella oxytolca community-acquired bacterial pneumonia.   Sputum culture positive for Klebsiella Awaiting sensitivities Continue Rocephin and azithromycin empirically continue the sensitivities return.   Procalcitonin trending down 1.1>> 0.79. Continue to monitor fever curve and WBC Trach suctioning by RT.   continue to maintain O2 saturation greater than 92%.  End-stage renal disease on hemodialysis on Monday Wednesday Friday.  We will continue Renvela, calcitriol and Renagel as well as renal multivitamins.  Seen by nephrology.  Electrolytes and volume status managed with dialysis Possible hemodialysis 10/20/2019.  Hypertensive emergency with flash pulmonary edema C/ home BP meds  Blood pressure is at goal Continue Norvasc  and ARB  Continue on as needed IV labetalol.  Dyslipidemia.  Continue statin therapy   Type 2 diabetes mellitus with hyperglycemias.   C/w basal and short acting insulin Hemoglobin A1c 7.4 on 10/17/2019 Continue to avoid hypoglycemia  GERD.  C/ PPI therapy   Paroxysmal atrial fib on eliquis.  C/ amiodarone for rhythm control. 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  eliquis   Objective: Vitals:   10/19/19 2111 10/20/19 0503 10/20/19 0752 10/20/19 0755  BP:  (!) 160/74 (!) 153/70   Pulse:  76 72 72  Resp:  18 20 18   Temp:  99.3 F (37.4 C) 98.8 F (37.1 C)   TempSrc:  Oral Oral   SpO2: 95% 97% 100% 98%  Weight:  65.7 kg    Height:        Intake/Output Summary (Last 24 hours) at 10/20/2019 1348 Last data filed at 10/20/2019 0400 Gross per 24 hour  Intake 420.08 ml  Output -  Net 420.08 ml   Filed Weights   10/18/19 0450 10/19/19 0258 10/20/19 0503  Weight: 63 kg 64 kg 65.7 kg    Exam:  . General: 63 y.o. year-old female well-developed well-nourished in no acute distress.  Alert and oriented x4.  Trach collar in place. . Cardiovascular: Regular rhythm.  No rubs or gallops.   Marland Kitchen Respiratory: Mild rales bilaterally anteriorly.  No wheezing noted.  Poor inspiratory effort.  Trach collar in place.   . Abdomen: Soft nontender nondistended normal bowel sounds.  Musculoskeletal: No lower extremity edema.  2/4 pulses in all 4 extremities.   Marland Kitchen Psychiatry: Mood is appropriate for condition and setting.   Data Reviewed: CBC: Recent Labs  Lab 10/17/19 0445 10/18/19 0537 10/19/19 0553 10/20/19 0523  WBC 12.7* 12.6* 13.2* 14.0*  NEUTROABS 9.7*  --   --  11.2*  HGB 9.4* 9.0* 8.3* 8.8*  HCT 28.4* 26.4* 25.4* 25.6*  MCV 89.3 87.4 90.4 87.1  PLT 250 236 240 [id number]   Basic Metabolic Panel: Recent Labs  Lab 10/17/19 0445 10/18/19 0517 10/19/19 0553 10/20/19 0523  NA 140 138 135 138  K  3.8 4.2 4.1 4.2  CL 98 96* 97* 100  CO2 27 31 27 24   GLUCOSE 166* 179* 190* 212*  BUN 49* 28* 49* 70*  CREATININE 6.47* 5.34* 7.46* 8.66*  CALCIUM 8.9 8.7* 8.3* 9.0  MG 1.8  --   --   --    GFR: Estimated Creatinine Clearance: 6 mL/min (A) (by C-G formula based on SCr of 8.66 mg/dL (H)). Liver Function Tests: No results for input(s): AST, ALT, ALKPHOS, BILITOT, PROT, ALBUMIN in the last 168 hours. No results for input(s): LIPASE, AMYLASE in the last 168 hours. No results for input(s): AMMONIA in the last 168 hours. Coagulation Profile: No results for input(s): INR, PROTIME in the last 168 hours. Cardiac Enzymes: No results for input(s): CKTOTAL, CKMB, CKMBINDEX, TROPONINI in the last 168 hours. BNP (last 3 results) No results for input(s): PROBNP in the last 8760 hours. HbA1C: No results for input(s): HGBA1C in the last 72 hours. CBG: Recent Labs  Lab 10/19/19 0742 10/19/19 1219 10/19/19 1730 10/19/19 2112 10/20/19 0752  GLUCAP 180* 189* 153* 130* 194*   Lipid Profile: No results for input(s): CHOL, HDL, LDLCALC, TRIG, CHOLHDL, LDLDIRECT in the last 72 hours. Thyroid Function Tests: No results for input(s): TSH, T4TOTAL, FREET4, T3FREE, THYROIDAB in  the last 72 hours. Anemia Panel: No results for input(s): VITAMINB12, FOLATE, FERRITIN, TIBC, IRON, RETICCTPCT in the last 72 hours. Urine analysis:    Component Value Date/Time   COLORURINE YELLOW (A) 09/09/2017 1056   APPEARANCEUR HAZY (A) 09/09/2017 1056   LABSPEC 1.013 09/09/2017 1056   PHURINE 7.0 09/09/2017 1056   GLUCOSEU NEGATIVE 09/09/2017 1056   HGBUR NEGATIVE 09/09/2017 1056   BILIRUBINUR NEGATIVE 09/09/2017 1056   KETONESUR NEGATIVE 09/09/2017 1056   PROTEINUR 100 (A) 09/09/2017 1056   NITRITE NEGATIVE 09/09/2017 1056   LEUKOCYTESUR MODERATE (A) 09/09/2017 1056   Sepsis Labs: @LABRCNTIP (procalcitonin:4,lacticidven:4)  ) Recent Results (from the past 240 hour(s))  SARS CORONAVIRUS 2 (TAT 6-24 HRS)  Nasopharyngeal Nasopharyngeal Swab     Status: None   Collection Time: 10/17/19  4:45 AM   Specimen: Nasopharyngeal Swab  Result Value Ref Range Status   SARS Coronavirus 2 NEGATIVE NEGATIVE Final    Comment: (NOTE) SARS-CoV-2 target nucleic acids are NOT DETECTED. The SARS-CoV-2 RNA is generally detectable in upper and lower respiratory specimens during the acute phase of infection. Negative results do not preclude SARS-CoV-2 infection, do not rule out co-infections with other pathogens, and should not be used as the sole basis for treatment or other patient management decisions. Negative results must be combined with clinical observations, patient history, and epidemiological information. The expected result is Negative. Fact Sheet for Patients: SugarRoll.be Fact Sheet for Healthcare Providers: https://www.woods-mathews.com/ This test is not yet approved or cleared by the [place] FDA and  has been authorized for detection and/or diagnosis of SARS-CoV-2 by FDA under an Emergency Use Authorization (EUA). This EUA will remain  in effect (meaning this test can be used) for the duration of the COVID-19 declaration under Section 56 4(b)(1) of the Act, 21 U.S.C. section 360bbb-3(b)(1), unless the authorization is terminated or revoked sooner. Performed at [facility], [place] [address] [place], [place] [address]   Culture, respiratory     Status: None (Preliminary result)   Collection Time: 10/17/19 12:42 PM   Specimen: Tracheal Aspirate  Result Value Ref Range Status   Specimen Description   Final    TRACHEAL ASPIRATE Performed at [facility], [address], [place], [place] [address]    Special Requests   Final    NONE Performed at [facility], [address] [place], [place] [address]    Gram Stain   Final    ABUNDANT WBC PRESENT, PREDOMINANTLY PMN ABUNDANT GRAM POSITIVE RODS FEW GRAM POSITIVE COCCI     Culture   Final    FEW KLEBSIELLA OXYTOCA CULTURE REINCUBATED FOR BETTER GROWTH Performed at [facility], [place] [address] [place],  [address]    Report Status PENDING  Incomplete   Organism ID, Bacteria KLEBSIELLA OXYTOCA  Final      Susceptibility   Klebsiella oxytoca - MIC*    AMPICILLIN >=32 RESISTANT Resistant     CEFAZOLIN 16 SENSITIVE Sensitive     CEFEPIME <=1 SENSITIVE Sensitive     CEFTAZIDIME <=1 SENSITIVE Sensitive     CEFTRIAXONE <=1 SENSITIVE Sensitive     CIPROFLOXACIN <=0.25 SENSITIVE Sensitive     GENTAMICIN <=1 SENSITIVE Sensitive     IMIPENEM <=0.25 SENSITIVE Sensitive     TRIMETH/SULFA <=20 SENSITIVE Sensitive     AMPICILLIN/SULBACTAM 16 INTERMEDIATE Intermediate     PIP/TAZO <=4 SENSITIVE Sensitive     Extended ESBL NEGATIVE Sensitive     * FEW KLEBSIELLA OXYTOCA  CULTURE, BLOOD (  ROUTINE X 2) w Reflex to ID Panel     Status: None (Preliminary result)   Collection Time: 10/17/19 12:50 PM   Specimen: BLOOD  Result Value Ref Range Status   Specimen Description BLOOD PORTA CATH  Final   Special Requests   Final    BOTTLES DRAWN AEROBIC AND ANAEROBIC Blood Culture results may not be optimal due to an excessive volume of blood received in culture bottles   Culture   Final    NO GROWTH 3 DAYS Performed at [facility], [address], [place], [place] [address]    Report Status PENDING  Incomplete  CULTURE, BLOOD (ROUTINE X 2) w Reflex to ID Panel     Status: None (Preliminary result)   Collection Time: 10/17/19  2:20 PM   Specimen: BLOOD  Result Value Ref Range Status   Specimen Description BLOOD RT HAND  Final   Special Requests   Final    BOTTLES DRAWN AEROBIC AND ANAEROBIC Blood Culture results may not be optimal due to an inadequate volume of blood received in culture bottles   Culture   Final    NO GROWTH 3 DAYS Performed at [facility], [address], [place], [place] [address]    Report Status PENDING   Incomplete  MRSA PCR Screening     Status: None   Collection Time: 10/17/19  9:50 PM   Specimen: Nasopharyngeal  Result Value Ref Range Status   MRSA by PCR NEGATIVE NEGATIVE Final    Comment:        The GeneXpert MRSA Assay (FDA approved for NASAL specimens only), is one component of a comprehensive MRSA colonization surveillance program. It is not intended to diagnose MRSA infection nor to guide or monitor treatment for MRSA infections. Performed at [facility], [address] [place], [place] [address]   Culture, respiratory     Status: None (Preliminary result)   Collection Time: 10/18/19  2:45 PM   Specimen: Tracheal Aspirate  Result Value Ref Range Status   Specimen Description   Final    TRACHEAL ASPIRATE Performed at [facility], [address], [place], [place] [address]    Special Requests   Final    NONE Performed at [facility], [place], [place] [address]    Gram Stain   Final    ABUNDANT WBC PRESENT,BOTH PMN AND MONONUCLEAR FEW GRAM POSITIVE COCCI FEW GRAM POSITIVE RODS FEW GRAM NEGATIVE RODS    Culture   Final    FEW GRAM NEGATIVE RODS IDENTIFICATION AND SUSCEPTIBILITIES TO FOLLOW Performed at [facility], [place] [address], [place], [place] [address]    Report Status PENDING  Incomplete      Studies: No results found.  Scheduled Meds: . amiodarone  200 mg Oral Daily  . apixaban  2.5 mg Oral BID  . aspirin EC  81 mg Oral Daily  . atorvastatin  80 mg Oral q1800  . carvedilol  25 mg Oral BID  . Chlorhexidine Gluconate Cloth  6 each Topical Q0600  . epoetin (EPOGEN/PROCRIT) injection  10,000 Units Intravenous Q M,W,F-HD  . furosemide  40 mg Intravenous Q12H  . gabapentin  100 mg Oral BID  . guaiFENesin  600 mg Oral BID  . insulin aspart  0-9 Units Subcutaneous TID WC  . insulin glargine  14 Units Subcutaneous QHS  . ipratropium-albuterol  3 mL Nebulization TID  . Melatonin  5 mg Oral QHS  .  midodrine  5 mg Oral Once per day on Mon Wed Fri  . pantoprazole  40 mg Oral BID  . polyethylene glycol  17 g Oral Daily  . senna-docusate  2 tablet Oral QHS  . sevelamer carbonate  1,600 mg Oral TID WC  . sodium chloride flush  3 mL Intravenous Q12H    Continuous Infusions: . sodium chloride 250 mL (10/18/19 0621)  . azithromycin 500 mg (10/20/19 0552)  . cefTRIAXone (ROCEPHIN)  IV 2 g (10/20/19 0919)     LOS: 3 days     Kayleen Memos, MD [facility] Pager [phone]  If 7PM-7AM, please contact night-coverage [email or web address] Password [email or web address] 10/20/2019, 1:48 PM

## 2019-10-20 NOTE — Progress Notes (Signed)
Post HD Tx Note Pt tolerated most of HD Tx however requested to stop Tx 20 min than prescribed time due to severe leg cramping.  Pt received 10K Ep as PO. Pt tx run for 2hrs62min on 3K2.5Ca. Pt received suctioning twice. Pt took off her trach collar. Report was given to RN and request to Respiratory to change Pt' inner trach collar. Pt reported no chest pain or SOB. [facility]    10/20/19 1830  Hand-Off documentation  Report given to (Full Name) Clinton Sawyer Christmas RN   Report received from (Full Name) Newt Minion RN   Vital Signs  Temp 98.9 F (37.2 C)  Temp Source Oral  Pulse Rate 79  Pulse Rate Source Monitor  Resp 20  BP (!) 147/77  BP Location Right Arm  BP Method Automatic  Patient Position (if appropriate) Lying  Oxygen Therapy  SpO2 100 %  O2 Device Tracheostomy Collar  O2 Flow Rate (L/min) 6 L/min  Pain Assessment  Pain Scale 0-10  Pain Score 0  Post-Hemodialysis Assessment  Rinseback Volume (mL) 250 mL  KECN 68.3 V  Dialyzer Clearance Lightly streaked  Duration of HD Treatment -hour(s) 2.4 hour(s)  Hemodialysis Intake (mL) 500 mL  UF Total -Machine (mL) 3152 mL  Net UF (mL) 2652 mL  Tolerated HD Treatment Yes  AVG/AVF Arterial Site Held (minutes) 8 minutes  AVG/AVF Venous Site Held (minutes) 7 minutes  Fistula / Graft Left Upper arm  Placement Date: 10/02/16   Placed prior to admission: No  Orientation: Left  Access Location: Upper arm  Site Condition No complications  Fistula / Graft Assessment Present;Thrill;Bruit  Status Deaccessed  Drainage Description None

## 2019-10-20 NOTE — Progress Notes (Signed)
HD Tx ended    10/20/19 1825  Vital Signs  Pulse Rate 80  BP 120/70  Oxygen Therapy  SpO2 100 %  O2 Device Tracheostomy Collar  O2 Flow Rate (L/min) 6 L/min  Pain Assessment  Pain Scale 0-10  Pain Score 8  During Hemodialysis Assessment  Blood Flow Rate (mL/min) 400 mL/min  Arterial Pressure (mmHg) -150 mmHg  Venous Pressure (mmHg) 150 mmHg  Transmembrane Pressure (mmHg) 70 mmHg  Ultrafiltration Rate (mL/min) 1000 mL/min  Dialysate Flow Rate (mL/min) 600 ml/min  Conductivity: Machine  14  HD Safety Checks Performed Yes  Intra-Hemodialysis Comments Progressing as prescribed

## 2019-10-20 NOTE — Progress Notes (Signed)
OT Cancellation Note  Patient Details Name: Deborah [name] MRN: [id number] DOB: June 21, 1956   Cancelled Treatment:    Reason Eval/Treat Not Completed: Patient at procedure or test/ unavailable. Consult received, chart reviewed. Pt out of room upon attempt. Will re-attempt OT evaluation at later date/time as pt is available and medically appropriate.   Jeni Salles, MPH, MS, OTR/L ascom [phone] 10/20/19, 3:03 PM

## 2019-10-21 LAB — CBC
HCT: 25.8 % — ABNORMAL LOW (ref 36.0–46.0)
Hemoglobin: 8.9 g/dL — ABNORMAL LOW (ref 12.0–15.0)
MCH: 30 pg (ref 26.0–34.0)
MCHC: 34.5 g/dL (ref 30.0–36.0)
MCV: 86.9 fL (ref 80.0–100.0)
Platelets: 280 10*3/uL (ref 150–400)
RBC: 2.97 MIL/uL — ABNORMAL LOW (ref 3.87–5.11)
RDW: 15.7 % — ABNORMAL HIGH (ref 11.5–15.5)
WBC: 13.5 10*3/uL — ABNORMAL HIGH (ref 4.0–10.5)
nRBC: 0 % (ref 0.0–0.2)

## 2019-10-21 LAB — CULTURE, RESPIRATORY W GRAM STAIN

## 2019-10-21 LAB — BASIC METABOLIC PANEL
Anion gap: 12 (ref 5–15)
BUN: 37 mg/dL — ABNORMAL HIGH (ref 8–23)
CO2: 26 mmol/L (ref 22–32)
Calcium: 8.5 mg/dL — ABNORMAL LOW (ref 8.9–10.3)
Chloride: 98 mmol/L (ref 98–111)
Creatinine, Ser: 5.88 mg/dL — ABNORMAL HIGH (ref 0.44–1.00)
GFR calc Af Amer: 8 mL/min — ABNORMAL LOW (ref >60)
GFR calc non Af Amer: 7 mL/min — ABNORMAL LOW (ref >60)
Glucose, Bld: 161 mg/dL — ABNORMAL HIGH (ref 70–99)
Potassium: 4.1 mmol/L (ref 3.5–5.1)
Sodium: 136 mmol/L (ref 135–145)

## 2019-10-21 LAB — GLUCOSE, CAPILLARY
Glucose-Capillary: 140 mg/dL — ABNORMAL HIGH (ref 70–99)
Glucose-Capillary: 167 mg/dL — ABNORMAL HIGH (ref 70–99)
Glucose-Capillary: 248 mg/dL — ABNORMAL HIGH (ref 70–99)
Glucose-Capillary: 188 mg/dL — ABNORMAL HIGH (ref 70–99)

## 2019-10-21 LAB — PROCALCITONIN: Procalcitonin: 1.19 ng/mL

## 2019-10-21 NOTE — Progress Notes (Signed)
PROGRESS NOTE  Deborah [name] [id number] DOB: [birth year] DOA: 10/17/2019 PCP: System, Pcp Not In  HPI/Recap of past 24 hours: Deborah [name] a63 y.o.African-American femalewith a known history of type II arrhythmias, hypertension, dyslipidemia and end-stage renal disease on hemodialysis on Monday Wednesday Friday, who presented to the emergency room with acute onset of worsening dyspnea and hypoxia with a pulse ox entry of 81% on room air. Patient is status post tracheostomy but is not on home O2. She has been having dry cough here occasionally productive of clear sputum at home. She admitted to orthopnea and paroxysmal nocturnal dyspnea. She had no fever or chills.No worsening lower extremity edema. No headache or dizziness or blurred vision. No nausea vomiting or abdominal pain. She did not miss any hemodialysis session.  Upon presentation to the emergency room, blood pressure was 184/95and later 200/89with pulse currently of 96% on her percent nonrebreather and a temperature of 99.2. Labs revealed mild cytosis with WBC of 12.7 and neutrophilia. ANC/ALC ratio was 9.7/1.4. BNP was 1350 and high-sensitivity troponin I of 37. CMP showed BUN of 49 creatinine 6.47. Blood cultures were ordered.  The patient was given IV cefepime and vancomycin as well as IV nitroglycerin drip. She will be admitted to a telemetry bed for further evaluation and management.  Patient was seen and examined at her bedside in the ED.  Reports dyspnea with minimal exertion worsened with ambulation.  Also reports pleuritic pain with nonproductive cough.  COVID-19 test in process.  Plan for HD today.  Last HD was on Wednesday, 10/15/2019.  Independently viewed chest x-ray done on admission which showed cardiomegaly with increase in pulmonary vascularity and bilateral pleural effusions.  BNP elevated greater than 1300.  Last 2D echo done in 2018 showed normal LVEF 55 to 60%.  A repeat TTE is pending.   Currently on Rocephin and azithromycin for Klebsiella oxytoca pneumonia. Resistant to ampicillin and unasyn  10/21/19: seen and examined at her bedside. No acute events overnight. Procalcitonin elevated >1,0. Planned HD tomorrow and possible dc to home after HD with oral abx.   Assessment/Plan: Active Problems:   Acute CHF (congestive heart failure) ([name])  Acute on chronic diastolic CHF with subsequent acute hypoxic respiratory failure.  Patient's last EF was 55 to 60% in 2018.  Hemodialyzed on 10/17/2019 with improvement of symptomatology. Seen by cardiology, following. Medications as recommended by cardiology. 2D echo done on 10/18/2019 shows normal LVEF.  Reviewed by cardiology.  No changes.  Will need to follow-up with her cardiologist outpatient.  Resolving acute hypoxic respiratory failure likely 2/2 to pulmonary edema suspect infective in the setting of PNA Continue volume management at hemodialysis Independently reviewed CXR done on 10/19/19 which showed b/l patchy infiltrated and R pleural effusion. Maintain O2 sat >92%  Klebsiella oxytolca community-acquired bacterial pneumonia.   Sputum culture positive for Klebsiella Resistant to ampicillin and unasyn Continue Rocephin and azithromycin empirically continue the sensitivities return.   Procalcitonin trending down 1.1>> 0.79>> 1.19 on 10/21/19 Wbc trending down Continue to monitor fever curve and WBC Trach suctioning by RT.   continue to maintain O2 saturation greater than 92%.  End-stage renal disease on hemodialysis on Monday Wednesday Friday.  We will continue Renvela, calcitriol and Renagel as well as renal multivitamins.  Seen by nephrology.  Electrolytes and volume status managed with dialysis Possible hemodialysis 10/20/2019.  Hypertensive emergency with flash pulmonary edema C/ home BP meds  Blood pressure is at goal Continue Norvasc and ARB  Continue on as needed IV  labetalol.  Dyslipidemia.  Continue  statin therapy   Type 2 diabetes mellitus with hyperglycemias.   C/w basal and short acting insulin Hemoglobin A1c 7.4 on 10/17/2019 Continue to avoid hypoglycemia  GERD.  C/ PPI therapy   Paroxysmal atrial fib on eliquis.  C/ amiodarone for rhythm control. 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  eliquis   Objective: Vitals:   10/20/19 1909 10/20/19 2026 10/21/19 0543 10/21/19 0740  BP: (!) 133/57  125/63 (!) 132/53  Pulse: 75  67 72  Resp:    17  Temp: 98.1 F (36.7 C)  98.3 F (36.8 C) 98 F (36.7 C)  TempSrc: Oral  Oral Oral  SpO2: 100% 100% 100% 99%  Weight:   62.6 kg   Height:        Intake/Output Summary (Last 24 hours) at 10/21/2019 1519 Last data filed at 10/21/2019 0900 Gross per 24 hour  Intake 240 ml  Output 2652 ml  Net -2412 ml   Filed Weights   10/19/19 0258 10/20/19 0503 10/21/19 0543  Weight: 64 kg 65.7 kg 62.6 kg    Exam:  . General: 63 y.o. year-old female WD WN NAD A&O x 4 Trach collar in place. . Cardiovascular: RRR no rubs or gallops.   Marland Kitchen Respiratory: Mild rales at bases. No wheezes.  Trach collar in place.   . Abdomen: soft NT ND NBS .  Musculoskeletal: No LE edema . Psychiatry:Mood is appropriate.   Data Reviewed: CBC: Recent Labs  Lab 10/17/19 0445 10/18/19 0537 10/19/19 0553 10/20/19 0523 10/21/19 0546  WBC 12.7* 12.6* 13.2* 14.0* 13.5*  NEUTROABS 9.7*  --   --  11.2*  --   HGB 9.4* 9.0* 8.3* 8.8* 8.9*  HCT 28.4* 26.4* 25.4* 25.6* 25.8*  MCV 89.3 87.4 90.4 87.1 86.9  PLT 250 236 240 263 [id number]   Basic Metabolic Panel: Recent Labs  Lab 10/17/19 0445 10/18/19 0517 10/19/19 0553 10/20/19 0523 10/20/19 0923 10/21/19 0546  NA 140 138 135 138 138 136  K 3.8 4.2 4.1 4.2 3.9 4.1  CL 98 96* 97* 100 100 98  CO2 27 31 27 24 23 26   GLUCOSE 166* 179* 190* 212* 157* 161*  BUN 49* 28* 49* 70* 77* 37*  CREATININE 6.47* 5.34* 7.46* 8.66* 9.60*  5.88*  CALCIUM 8.9 8.7* 8.3* 9.0 9.0 8.5*  MG 1.8  --   --   --   --   --   PHOS  --   --   --   --  2.9  --    GFR: Estimated Creatinine Clearance: 8.6 mL/min (A) (by C-G formula based on SCr of 5.88 mg/dL (H)). Liver Function Tests: Recent Labs  Lab 10/20/19 0923  ALBUMIN 3.0*   No results for input(s): LIPASE, AMYLASE in the last 168 hours. No results for input(s): AMMONIA in the last 168 hours. Coagulation Profile: No results for input(s): INR, PROTIME in the last 168 hours. Cardiac Enzymes: No results for input(s): CKTOTAL, CKMB, CKMBINDEX, TROPONINI in the last 168 hours. BNP (last 3 results) No results for input(s): PROBNP in the last 8760 hours. HbA1C: No results for input(s): HGBA1C in the last 72 hours. CBG: Recent Labs  Lab 10/19/19 2112 10/20/19 0752 10/20/19 1203 10/21/19 0730 10/21/19 1153  GLUCAP 130* 194* 150* 140* 167*   Lipid Profile: No results for input(s): CHOL, HDL, LDLCALC, TRIG, CHOLHDL, LDLDIRECT in the last 72 hours. Thyroid Function Tests: No  results for input(s): TSH, T4TOTAL, FREET4, T3FREE, THYROIDAB in the last 72 hours. Anemia Panel: No results for input(s): VITAMINB12, FOLATE, FERRITIN, TIBC, IRON, RETICCTPCT in the last 72 hours. Urine analysis:    Component Value Date/Time   COLORURINE YELLOW (A) 09/09/2017 1056   APPEARANCEUR HAZY (A) 09/09/2017 1056   LABSPEC 1.013 09/09/2017 1056   PHURINE 7.0 09/09/2017 1056   GLUCOSEU NEGATIVE 09/09/2017 1056   HGBUR NEGATIVE 09/09/2017 1056   BILIRUBINUR NEGATIVE 09/09/2017 1056   KETONESUR NEGATIVE 09/09/2017 1056   PROTEINUR 100 (A) 09/09/2017 1056   NITRITE NEGATIVE 09/09/2017 1056   LEUKOCYTESUR MODERATE (A) 09/09/2017 1056   Sepsis Labs: @LABRCNTIP (procalcitonin:4,lacticidven:4)  ) Recent Results (from the past 240 hour(s))  SARS CORONAVIRUS 2 (TAT 6-24 HRS) Nasopharyngeal Nasopharyngeal Swab     Status: None   Collection Time: 10/17/19  4:45 AM   Specimen: Nasopharyngeal Swab   Result Value Ref Range Status   SARS Coronavirus 2 NEGATIVE NEGATIVE Final    Comment: (NOTE) SARS-CoV-2 target nucleic acids are NOT DETECTED. The SARS-CoV-2 RNA is generally detectable in upper and lower respiratory specimens during the acute phase of infection. Negative results do not preclude SARS-CoV-2 infection, do not rule out co-infections with other pathogens, and should not be used as the sole basis for treatment or other patient management decisions. Negative results must be combined with clinical observations, patient history, and epidemiological information. The expected result is Negative. Fact Sheet for Patients: SugarRoll.be Fact Sheet for Healthcare Providers: https://www.woods-mathews.com/ This test is not yet approved or cleared by the [place] FDA and  has been authorized for detection and/or diagnosis of SARS-CoV-2 by FDA under an Emergency Use Authorization (EUA). This EUA will remain  in effect (meaning this test can be used) for the duration of the COVID-19 declaration under Section 56 4(b)(1) of the Act, 21 U.S.C. section 360bbb-3(b)(1), unless the authorization is terminated or revoked sooner. Performed at [facility], [place] [address] [place], [place] [address]   Culture, respiratory     Status: None   Collection Time: 10/17/19 12:42 PM   Specimen: Tracheal Aspirate  Result Value Ref Range Status   Specimen Description   Final    TRACHEAL ASPIRATE Performed at [facility], [address] [place], [place] [address]    Special Requests   Final    NONE Performed at [facility], [place], [place] [address]    Gram Stain   Final    ABUNDANT WBC PRESENT, PREDOMINANTLY PMN ABUNDANT GRAM POSITIVE RODS FEW GRAM POSITIVE COCCI Performed at [facility], [place] [address], [place], [place] [address]    Culture   Final    FEW KLEBSIELLA OXYTOCA FEW STAPHYLOCOCCUS  AUREUS    Report Status 10/21/2019 FINAL  Final   Organism ID, Bacteria KLEBSIELLA OXYTOCA  Final   Organism ID, Bacteria STAPHYLOCOCCUS AUREUS  Final      Susceptibility   Klebsiella oxytoca - MIC*    AMPICILLIN >=32 RESISTANT Resistant     CEFAZOLIN 16 SENSITIVE Sensitive     CEFEPIME <=1 SENSITIVE Sensitive     CEFTAZIDIME <=1 SENSITIVE Sensitive     CEFTRIAXONE <=1 SENSITIVE Sensitive     CIPROFLOXACIN <=0.25 SENSITIVE Sensitive     GENTAMICIN <=1 SENSITIVE Sensitive     IMIPENEM <=0.25 SENSITIVE Sensitive     TRIMETH/SULFA <=20 SENSITIVE Sensitive     AMPICILLIN/SULBACTAM 16 INTERMEDIATE Intermediate     PIP/TAZO <=4 SENSITIVE Sensitive  Extended ESBL NEGATIVE Sensitive     * FEW KLEBSIELLA OXYTOCA   Staphylococcus aureus - MIC*    CIPROFLOXACIN <=0.5 SENSITIVE Sensitive     ERYTHROMYCIN <=0.25 SENSITIVE Sensitive     GENTAMICIN <=0.5 SENSITIVE Sensitive     OXACILLIN <=0.25 SENSITIVE Sensitive     TETRACYCLINE <=1 SENSITIVE Sensitive     VANCOMYCIN 1 SENSITIVE Sensitive     TRIMETH/SULFA <=10 SENSITIVE Sensitive     CLINDAMYCIN <=0.25 SENSITIVE Sensitive     RIFAMPIN <=0.5 SENSITIVE Sensitive     Inducible Clindamycin NEGATIVE Sensitive     * FEW STAPHYLOCOCCUS AUREUS  CULTURE, BLOOD (ROUTINE X 2) w Reflex to ID Panel     Status: None (Preliminary result)   Collection Time: 10/17/19 12:50 PM   Specimen: BLOOD  Result Value Ref Range Status   Specimen Description BLOOD PORTA CATH  Final   Special Requests   Final    BOTTLES DRAWN AEROBIC AND ANAEROBIC Blood Culture results may not be optimal due to an excessive volume of blood received in culture bottles   Culture   Final    NO GROWTH 4 DAYS Performed at [facility], [address] [place], [place] [address]    Report Status PENDING  Incomplete  CULTURE, BLOOD (ROUTINE X 2) w Reflex to ID Panel     Status: None (Preliminary result)   Collection Time: 10/17/19  2:20 PM   Specimen: BLOOD  Result Value  Ref Range Status   Specimen Description BLOOD RT HAND  Final   Special Requests   Final    BOTTLES DRAWN AEROBIC AND ANAEROBIC Blood Culture results may not be optimal due to an inadequate volume of blood received in culture bottles   Culture   Final    NO GROWTH 4 DAYS Performed at [facility], [address], [place], [place] [address]    Report Status PENDING  Incomplete  MRSA PCR Screening     Status: None   Collection Time: 10/17/19  9:50 PM   Specimen: Nasopharyngeal  Result Value Ref Range Status   MRSA by PCR NEGATIVE NEGATIVE Final    Comment:        The GeneXpert MRSA Assay (FDA approved for NASAL specimens only), is one component of a comprehensive MRSA colonization surveillance program. It is not intended to diagnose MRSA infection nor to guide or monitor treatment for MRSA infections. Performed at [facility], [address] [place], [place] [address]   Culture, respiratory     Status: None   Collection Time: 10/18/19  2:45 PM   Specimen: Tracheal Aspirate  Result Value Ref Range Status   Specimen Description   Final    TRACHEAL ASPIRATE Performed at [facility], [address] [place], [place] [address]    Special Requests   Final    NONE Performed at [facility], [place], [place] [address]    Gram Stain   Final    ABUNDANT WBC PRESENT,BOTH PMN AND MONONUCLEAR FEW GRAM POSITIVE COCCI FEW GRAM POSITIVE RODS FEW GRAM NEGATIVE RODS Performed at [facility], [place] [address] [place],  [address]    Culture FEW KLEBSIELLA OXYTOCA  Final   Report Status 10/21/2019 FINAL  Final   Organism ID, Bacteria KLEBSIELLA OXYTOCA  Final      Susceptibility   Klebsiella oxytoca - MIC*    AMPICILLIN >=32 RESISTANT Resistant     CEFAZOLIN <=4 SENSITIVE Sensitive  CEFEPIME <=1 SENSITIVE Sensitive     CEFTAZIDIME <=1 SENSITIVE Sensitive     CEFTRIAXONE <=1 SENSITIVE Sensitive     CIPROFLOXACIN  <=0.25 SENSITIVE Sensitive     GENTAMICIN <=1 SENSITIVE Sensitive     IMIPENEM <=0.25 SENSITIVE Sensitive     TRIMETH/SULFA <=20 SENSITIVE Sensitive     AMPICILLIN/SULBACTAM 16 INTERMEDIATE Intermediate     PIP/TAZO <=4 SENSITIVE Sensitive     Extended ESBL NEGATIVE Sensitive     * FEW KLEBSIELLA OXYTOCA      Studies: No results found.  Scheduled Meds: . amiodarone  200 mg Oral Daily  . apixaban  2.5 mg Oral BID  . aspirin EC  81 mg Oral Daily  . atorvastatin  80 mg Oral q1800  . carvedilol  25 mg Oral BID  . Chlorhexidine Gluconate Cloth  6 each Topical Q0600  . epoetin (EPOGEN/PROCRIT) injection  10,000 Units Intravenous Q M,W,F-HD  . furosemide  40 mg Intravenous Q12H  . gabapentin  100 mg Oral BID  . guaiFENesin  600 mg Oral BID  . insulin aspart  0-9 Units Subcutaneous TID WC  . insulin glargine  14 Units Subcutaneous QHS  . ipratropium-albuterol  3 mL Nebulization TID  . Melatonin  5 mg Oral QHS  . pantoprazole  40 mg Oral BID  . polyethylene glycol  17 g Oral Daily  . senna-docusate  2 tablet Oral QHS  . sevelamer carbonate  1,600 mg Oral TID WC  . sodium chloride flush  3 mL Intravenous Q12H    Continuous Infusions: . sodium chloride 250 mL (10/18/19 0621)     LOS: 4 days     Kayleen Memos, MD [facility] Pager [phone]  If 7PM-7AM, please contact night-coverage [email or web address] Password [facility] 10/21/2019, 3:19 PM

## 2019-10-21 NOTE — Progress Notes (Signed)
OT Cancellation Note  Patient Details Name: Deborah [name] MRN: [id number] DOB: 13-Nov-1956   Cancelled Treatment:    Reason Eval/Treat Not Completed: Other (comment). Upon attempt, pt requesting deep suction prior to activity. RN notified. Will re-attempt OT evaluation at later date/time as pt is able to participate and medically appropriate.   Jeni Salles, MPH, MS, OTR/L ascom [phone] 10/21/19, 10:02 AM

## 2019-10-21 NOTE — Progress Notes (Signed)
[facility]  ROUNDING NOTE   Subjective:   Denies any acute complaints Currently requiring high flow oxygen Son at bedside 11/16 0701 - 11/17 0700 In: -  Out: 2652  2600 cc removed with HD yesterday   Objective:  Vital signs in last 24 hours:  Temp:  [98 F (36.7 C)-98.9 F (37.2 C)] 98.4 F (36.9 C) (11/17 1609) Pulse Rate:  [67-80] 68 (11/17 1609) Resp:  [17-20] 17 (11/17 1609) BP: (68-147)/(53-77) 128/60 (11/17 1609) SpO2:  [98 %-100 %] 100 % (11/17 1609) FiO2 (%):  [28 %] 28 % (11/16 2026) Weight:  [62.6 kg] 62.6 kg (11/17 0543)  Weight change: -3.039 kg Filed Weights   10/19/19 0258 10/20/19 0503 10/21/19 0543  Weight: 64 kg 65.7 kg 62.6 kg    Intake/Output: I/O last 3 completed shifts: In: 420.1 [IV Piggyback:420.1] Out: 2652 [Other:2652]   Intake/Output this shift:  Total I/O In: 240 [P.O.:240] Out: -   Physical Exam: General: Sitting up in bed  Head: Normocephalic, atraumatic. Moist oral mucosal membranes  Eyes: Anicteric,  Neck: +tracheostomy  Lungs:  Mild coarse Breath sounds. Improved exam  Heart: Regular rate and rhythm  Abdomen:  Soft, nontender  Extremities:  trace peripheral edema.  Neurologic: Nonfocal, able to follow simple commands  Skin: No lesions  Access: Left AVG    Basic Metabolic Panel: Recent Labs  Lab 10/17/19 0445 10/18/19 0517 10/19/19 0553 10/20/19 0523 10/20/19 0923 10/21/19 0546  NA 140 138 135 138 138 136  K 3.8 4.2 4.1 4.2 3.9 4.1  CL 98 96* 97* 100 100 98  CO2 27 31 27 24 23 26   GLUCOSE 166* 179* 190* 212* 157* 161*  BUN 49* 28* 49* 70* 77* 37*  CREATININE 6.47* 5.34* 7.46* 8.66* 9.60* 5.88*  CALCIUM 8.9 8.7* 8.3* 9.0 9.0 8.5*  MG 1.8  --   --   --   --   --   PHOS  --   --   --   --  2.9  --     Liver Function Tests: Recent Labs  Lab 10/20/19 0923  ALBUMIN 3.0*   No results for input(s): LIPASE, AMYLASE in the last 168 hours. No results for input(s): AMMONIA in the last 168  hours.  CBC: Recent Labs  Lab 10/17/19 0445 10/18/19 0537 10/19/19 0553 10/20/19 0523 10/21/19 0546  WBC 12.7* 12.6* 13.2* 14.0* 13.5*  NEUTROABS 9.7*  --   --  11.2*  --   HGB 9.4* 9.0* 8.3* 8.8* 8.9*  HCT 28.4* 26.4* 25.4* 25.6* 25.8*  MCV 89.3 87.4 90.4 87.1 86.9  PLT 250 236 240 263 280    Cardiac Enzymes: No results for input(s): CKTOTAL, CKMB, CKMBINDEX, TROPONINI in the last 168 hours.  BNP: Invalid input(s): POCBNP  CBG: Recent Labs  Lab 10/20/19 0752 10/20/19 1203 10/21/19 0730 10/21/19 1153 10/21/19 1629  GLUCAP 194* 150* 140* 167* 248*    Microbiology: Results for orders placed or performed during the hospital encounter of 10/17/19  SARS CORONAVIRUS 2 (TAT 6-24 HRS) Nasopharyngeal Nasopharyngeal Swab     Status: None   Collection Time: 10/17/19  4:45 AM   Specimen: Nasopharyngeal Swab  Result Value Ref Range Status   SARS Coronavirus 2 NEGATIVE NEGATIVE Final    Comment: (NOTE) SARS-CoV-2 target nucleic acids are NOT DETECTED. The SARS-CoV-2 RNA is generally detectable in upper and lower respiratory specimens during the acute phase of infection. Negative results do not preclude SARS-CoV-2 infection, do not rule out co-infections with  other pathogens, and should not be used as the sole basis for treatment or other patient management decisions. Negative results must be combined with clinical observations, patient history, and epidemiological information. The expected result is Negative. Fact Sheet for Patients: SugarRoll.be Fact Sheet for Healthcare Providers: https://www.woods-mathews.com/ This test is not yet approved or cleared by the [place] FDA and  has been authorized for detection and/or diagnosis of SARS-CoV-2 by FDA under an Emergency Use Authorization (EUA). This EUA will remain  in effect (meaning this test can be used) for the duration of the COVID-19 declaration under Section 56 4(b)(1) of  the Act, 21 U.S.C. section 360bbb-3(b)(1), unless the authorization is terminated or revoked sooner. Performed at [facility], [place] [address] [place], [place] [address]   Culture, respiratory     Status: None   Collection Time: 10/17/19 12:42 PM   Specimen: Tracheal Aspirate  Result Value Ref Range Status   Specimen Description   Final    TRACHEAL ASPIRATE Performed at [facility], [address], [place], [place] [address]    Special Requests   Final    NONE Performed at [facility], [place], [place] [address]    Gram Stain   Final    ABUNDANT WBC PRESENT, PREDOMINANTLY PMN ABUNDANT GRAM POSITIVE RODS FEW GRAM POSITIVE COCCI Performed at [facility], [place] [address], [place], [place] [address]    Culture   Final    FEW KLEBSIELLA OXYTOCA FEW STAPHYLOCOCCUS AUREUS    Report Status 10/21/2019 FINAL  Final   Organism ID, Bacteria KLEBSIELLA OXYTOCA  Final   Organism ID, Bacteria STAPHYLOCOCCUS AUREUS  Final      Susceptibility   Klebsiella oxytoca - MIC*    AMPICILLIN >=32 RESISTANT Resistant     CEFAZOLIN 16 SENSITIVE Sensitive     CEFEPIME <=1 SENSITIVE Sensitive     CEFTAZIDIME <=1 SENSITIVE Sensitive     CEFTRIAXONE <=1 SENSITIVE Sensitive     CIPROFLOXACIN <=0.25 SENSITIVE Sensitive     GENTAMICIN <=1 SENSITIVE Sensitive     IMIPENEM <=0.25 SENSITIVE Sensitive     TRIMETH/SULFA <=20 SENSITIVE Sensitive     AMPICILLIN/SULBACTAM 16 INTERMEDIATE Intermediate     PIP/TAZO <=4 SENSITIVE Sensitive     Extended ESBL NEGATIVE Sensitive     * FEW KLEBSIELLA OXYTOCA   Staphylococcus aureus - MIC*    CIPROFLOXACIN <=0.5 SENSITIVE Sensitive     ERYTHROMYCIN <=0.25 SENSITIVE Sensitive     GENTAMICIN <=0.5 SENSITIVE Sensitive     OXACILLIN <=0.25 SENSITIVE Sensitive     TETRACYCLINE <=1 SENSITIVE Sensitive     VANCOMYCIN 1 SENSITIVE Sensitive     TRIMETH/SULFA <=10 SENSITIVE Sensitive     CLINDAMYCIN <=0.25 SENSITIVE  Sensitive     RIFAMPIN <=0.5 SENSITIVE Sensitive     Inducible Clindamycin NEGATIVE Sensitive     * FEW STAPHYLOCOCCUS AUREUS  CULTURE, BLOOD (ROUTINE X 2) w Reflex to ID Panel     Status: None (Preliminary result)   Collection Time: 10/17/19 12:50 PM   Specimen: BLOOD  Result Value Ref Range Status   Specimen Description BLOOD PORTA CATH  Final   Special Requests   Final    BOTTLES DRAWN AEROBIC AND ANAEROBIC Blood Culture results may not be optimal due to an excessive volume of blood received in culture bottles   Culture   Final    NO GROWTH 4 DAYS Performed at [facility], [address] [place], [place] [address]  Report Status PENDING  Incomplete  CULTURE, BLOOD (ROUTINE X 2) w Reflex to ID Panel     Status: None (Preliminary result)   Collection Time: 10/17/19  2:20 PM   Specimen: BLOOD  Result Value Ref Range Status   Specimen Description BLOOD RT HAND  Final   Special Requests   Final    BOTTLES DRAWN AEROBIC AND ANAEROBIC Blood Culture results may not be optimal due to an inadequate volume of blood received in culture bottles   Culture   Final    NO GROWTH 4 DAYS Performed at [facility], [address], [place], [place] [address]    Report Status PENDING  Incomplete  MRSA PCR Screening     Status: None   Collection Time: 10/17/19  9:50 PM   Specimen: Nasopharyngeal  Result Value Ref Range Status   MRSA by PCR NEGATIVE NEGATIVE Final    Comment:        The GeneXpert MRSA Assay (FDA approved for NASAL specimens only), is one component of a comprehensive MRSA colonization surveillance program. It is not intended to diagnose MRSA infection nor to guide or monitor treatment for MRSA infections. Performed at [facility], [address] [place], [place] [address]   Culture, respiratory     Status: None   Collection Time: 10/18/19  2:45 PM   Specimen: Tracheal Aspirate  Result Value Ref Range Status   Specimen Description   Final     TRACHEAL ASPIRATE Performed at [facility], [address] [place], [place] [address]    Special Requests   Final    NONE Performed at [facility], [place], [place] [address]    Gram Stain   Final    ABUNDANT WBC PRESENT,BOTH PMN AND MONONUCLEAR FEW GRAM POSITIVE COCCI FEW GRAM POSITIVE RODS FEW GRAM NEGATIVE RODS Performed at [facility], [place] [address] [place], [place] [address]    Culture FEW KLEBSIELLA OXYTOCA  Final   Report Status 10/21/2019 FINAL  Final   Organism ID, Bacteria KLEBSIELLA OXYTOCA  Final      Susceptibility   Klebsiella oxytoca - MIC*    AMPICILLIN >=32 RESISTANT Resistant     CEFAZOLIN <=4 SENSITIVE Sensitive     CEFEPIME <=1 SENSITIVE Sensitive     CEFTAZIDIME <=1 SENSITIVE Sensitive     CEFTRIAXONE <=1 SENSITIVE Sensitive     CIPROFLOXACIN <=0.25 SENSITIVE Sensitive     GENTAMICIN <=1 SENSITIVE Sensitive     IMIPENEM <=0.25 SENSITIVE Sensitive     TRIMETH/SULFA <=20 SENSITIVE Sensitive     AMPICILLIN/SULBACTAM 16 INTERMEDIATE Intermediate     PIP/TAZO <=4 SENSITIVE Sensitive     Extended ESBL NEGATIVE Sensitive     * FEW KLEBSIELLA OXYTOCA    Coagulation Studies: No results for input(s): LABPROT, INR in the last 72 hours.  Urinalysis: No results for input(s): COLORURINE, LABSPEC, PHURINE, GLUCOSEU, HGBUR, BILIRUBINUR, KETONESUR, PROTEINUR, UROBILINOGEN, NITRITE, LEUKOCYTESUR in the last 72 hours.  Invalid input(s): APPERANCEUR    Imaging: No results found.   Medications:   . sodium chloride 250 mL (10/18/19 0621)   . amiodarone  200 mg Oral Daily  . apixaban  2.5 mg Oral BID  . aspirin EC  81 mg Oral Daily  . atorvastatin  80 mg Oral q1800  . carvedilol  25 mg Oral BID  . Chlorhexidine Gluconate Cloth  6 each Topical Q0600  . epoetin (EPOGEN/PROCRIT) injection  10,000 Units Intravenous Q M,W,F-HD  .  furosemide  40 mg Intravenous Q12H  . gabapentin  100 mg Oral BID  . guaiFENesin   600 mg Oral BID  . insulin aspart  0-9 Units Subcutaneous TID WC  . insulin glargine  14 Units Subcutaneous QHS  . ipratropium-albuterol  3 mL Nebulization TID  . Melatonin  5 mg Oral QHS  . pantoprazole  40 mg Oral BID  . polyethylene glycol  17 g Oral Daily  . senna-docusate  2 tablet Oral QHS  . sevelamer carbonate  1,600 mg Oral TID WC  . sodium chloride flush  3 mL Intravenous Q12H   sodium chloride, acetaminophen, albuterol, ALPRAZolam, chlorpheniramine-HYDROcodone, labetalol, labetalol, midodrine, ondansetron (ZOFRAN) IV, oxyCODONE, QUEtiapine, sodium chloride flush, zolpidem  Assessment/ Plan:  [name] is a 63 y.o. black female with end stage renal disease on hemodialysis, tracheostomy, hypotension, diabetes mellitus type II insulin dependent, diabetic neuropathy,  coronary artery disease, mitral valve repair, atrial flutter on apixaban  [facility] MWF [facility] left AVG Chronic tracheostomy  1. End Stage Renal Disease: with pulmonary edema - emergent dialysis on admission - Continue MWF schedule - next HD to be scheduled for wednesday  2. Acute hypoxic Respiratory failure: requiring oxygen. COVID-19 negative chronic tracheostomy - empiric antibiotics. - Klebsiella pneumonia.  Antibiotics as per internal medicine team  3. Hypertension: usually requires midodrine before dialysis. However with elevated blood pressure today - midodrine before dialysis as needed for hypotension  4. Anemia of chronic kidney disease:   Lab Results  Component Value Date   HGB 8.9 (L) 10/21/2019   - EPO with HD treatments.  5. Secondary Hyperparathyroidism: Lab Results  Component Value Date   PTH 270 (H) 10/03/2017   CALCIUM 8.5 (L) 10/21/2019   CAION 1.09 (L) 09/01/2016   PHOS 2.9 10/20/2019   - sevelamer with meals     LOS: 4 Deborah [name] 11/17/20205:08 PM

## 2019-10-21 NOTE — Progress Notes (Signed)
Physical Therapy Treatment Patient Details Name: Deborah [name] MRN: [id number] DOB: [birth year] Today's Date: 10/21/2019    History of Present Illness Per MD H&P: Pt is a 63 y.o. African-American female with a known history of type II arrhythmias, hypertension, dyslipidemia and end-stage renal disease on hemodialysis on M,W,F, who presented to the emergency room with acute onset of worsening dyspnea and hypoxia with a pulse ox entry of 81% on room air.  Patient is status post tracheostomy but is not on home O2.  She has been having dry cough here occasionally productive of clear sputum at home.  She admitted to orthopnea and paroxysmal nocturnal dyspnea.  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    PT Comments    Pt presented with deficits in strength, transfers, mobility, gait, balance, and activity tolerance but made good progress towards goals this session.  Pt continued to demonstrate good eccentric and concentric control with transfers from various height surfaces.  Pt ambulated with both a RW and a SPC 2 x 20' each with slow cadence and short B step length with min instability with the SPC and no noted instability with the RW.  Pt did not require physical assistance to recover from min instability with the [facility] and prefers the cane over the RW.  Pt will benefit from HHPT services upon discharge to safely address above deficits for decreased caregiver assistance and eventual return to PLOF.     Follow Up Recommendations  Home health PT;Supervision - Intermittent     Equipment Recommendations  None recommended by PT    Recommendations for Other Services       Precautions / Restrictions Precautions Precautions: Fall Restrictions Weight Bearing Restrictions: No    Mobility  Bed Mobility Overal bed  mobility: Modified Independent             General bed mobility comments: Extra time and effort only without use of rail  Transfers Overall transfer level: Needs assistance Equipment used: Rolling walker (2 wheeled);Straight cane Transfers: Sit to/from Stand Sit to Stand: Supervision         General transfer comment: Good eccentric and concentric control  Ambulation/Gait Ambulation/Gait assistance: Min guard Gait Distance (Feet): 20 Feet Assistive device: Rolling walker (2 wheeled);Straight cane Gait Pattern/deviations: Step-through pattern;Decreased step length - right;Decreased step length - left Gait velocity: decreased   General Gait Details: Slow cadence with gait with short B step length with min instability with the SPC and no noted instability with the RW   Stairs             Wheelchair Mobility    Modified Rankin (Stroke Patients Only)       Balance Overall balance assessment: Needs assistance Sitting-balance support: No upper extremity supported;Feet supported Sitting balance-Leahy Scale: Normal     Standing balance support: Bilateral upper extremity supported;During functional activity;Single extremity supported Standing balance-Leahy Scale: Good Standing balance comment: Fair stability with the SPC and good stability with the RW                            Cognition Arousal/Alertness: Awake/alert Behavior During Therapy: WFL for tasks assessed/performed Overall Cognitive Status: Within Functional Limits for tasks assessed  Exercises Total Joint Exercises Ankle Circles/Pumps: Strengthening;Both;10 reps Quad Sets: Strengthening;Both;10 reps Towel Squeeze: Strengthening;Both;10 reps Hip ABduction/ADduction: AROM;Both;5 reps Straight Leg Raises: AROM;Both;5 reps Long Arc Quad: Strengthening;Both;10 reps Marching in Standing: AROM;Both;10 reps;Seated;5 reps    General  Comments        Pertinent Vitals/Pain Pain Assessment: No/denies pain    Home Living                      Prior Function            PT Goals (current goals can now be found in the care plan section) Progress towards PT goals: Progressing toward goals    Frequency    Min 2X/week      PT Plan Current plan remains appropriate    Co-evaluation              AM-PAC PT "6 Clicks" Mobility   Outcome Measure  Help needed turning from your back to your side while in a flat bed without using bedrails?: None Help needed moving from lying on your back to sitting on the side of a flat bed without using bedrails?: None Help needed moving to and from a bed to a chair (including a wheelchair)?: A Little Help needed standing up from a chair using your arms (e.g., wheelchair or bedside chair)?: A Little Help needed to walk in hospital room?: A Little Help needed climbing 3-5 steps with a railing? : A Little 6 Click Score: 20    End of Session Equipment Utilized During Treatment: Gait belt;Oxygen Activity Tolerance: Patient tolerated treatment well Patient left: in chair;with call bell/phone within reach;with family/visitor present;with chair alarm set Nurse Communication: Mobility status PT Visit Diagnosis: Muscle weakness (generalized) (M62.81);Difficulty in walking, not elsewhere classified (R26.2)     Time: [id number] PT Time Calculation (min) (ACUTE ONLY): 32 min  Charges:  $Gait Training: 8-22 mins $Therapeutic Exercise: 8-22 mins                     D. Scott Evie Crumpler PT, DPT 10/21/19, 4:35 PM

## 2019-10-21 NOTE — Progress Notes (Signed)
Patient Name: Deborah [name] Date of Encounter: 10/21/2019  Hospital Problem List     Active Problems:   Acute CHF (congestive heart failure) [facility])    Patient Profile     63 y.o.femalewith history ofend-stage renal disease on hemodialysis and is awaiting renal transplant, hypertension,cad s/p pci/cabg,history of valve replacement in the mitral position with a bioprosthetic device, history of paroxysmal atrial fibrillation,, history of tracheostomy who presented to emergency room with acute onset of worsening shortness of breath. She was noted to have hypoxia with a pulse oximetry of 81% on room air .CXR revealed cardiomegaly with pulmonary vascular congestion.   Subjective   Less short of breath today.  She feels better.  Inpatient Medications    . amiodarone  200 mg Oral Daily  . apixaban  2.5 mg Oral BID  . aspirin EC  81 mg Oral Daily  . atorvastatin  80 mg Oral q1800  . carvedilol  25 mg Oral BID  . Chlorhexidine Gluconate Cloth  6 each Topical Q0600  . epoetin (EPOGEN/PROCRIT) injection  10,000 Units Intravenous Q M,W,F-HD  . furosemide  40 mg Intravenous Q12H  . gabapentin  100 mg Oral BID  . guaiFENesin  600 mg Oral BID  . insulin aspart  0-9 Units Subcutaneous TID WC  . insulin glargine  14 Units Subcutaneous QHS  . ipratropium-albuterol  3 mL Nebulization TID  . Melatonin  5 mg Oral QHS  . pantoprazole  40 mg Oral BID  . polyethylene glycol  17 g Oral Daily  . senna-docusate  2 tablet Oral QHS  . sevelamer carbonate  1,600 mg Oral TID WC  . sodium chloride flush  3 mL Intravenous Q12H    Vital Signs    Vitals:   10/20/19 1909 10/20/19 2026 10/21/19 0543 10/21/19 0740  BP: (!) 133/57  125/63 (!) 132/53  Pulse: 75  67 72  Resp:    17  Temp: 98.1 F (36.7 C)  98.3 F (36.8 C) 98 F (36.7 C)  TempSrc: Oral  Oral Oral  SpO2: 100% 100% 100% 99%  Weight:   62.6 kg   Height:        Intake/Output Summary (Last 24 hours) at 10/21/2019 1245 Last  data filed at 10/21/2019 0900 Gross per 24 hour  Intake 240 ml  Output 2652 ml  Net -2412 ml   Filed Weights   10/19/19 0258 10/20/19 0503 10/21/19 0543  Weight: 64 kg 65.7 kg 62.6 kg    Physical Exam    GEN: Well nourished, well developed, in no acute distress.  HEENT: normal.  Neck: Supple, no JVD, carotid bruits, or masses. Cardiac: RRR, no murmurs, rubs, or gallops. No clubbing, cyanosis, edema.  Radials/DP/PT 2+ and equal bilaterally.  Respiratory:  Respirations regular and unlabored, clear to auscultation bilaterally. GI: Soft, nontender, nondistended, BS + x 4. MS: no deformity or atrophy. Skin: warm and dry, no rash. Neuro:  Strength and sensation are intact. Psych: Normal affect.  Labs    CBC Recent Labs    10/20/19 0523 10/21/19 0546  WBC 14.0* 13.5*  NEUTROABS 11.2*  --   HGB 8.8* 8.9*  HCT 25.6* 25.8*  MCV 87.1 86.9  PLT 263 [id number]   Basic Metabolic Panel Recent Labs    10/20/19 0923 10/21/19 0546  NA 138 136  K 3.9 4.1  CL 100 98  CO2 23 26  GLUCOSE 157* 161*  BUN 77* 37*  CREATININE 9.60* 5.88*  CALCIUM 9.0 8.5*  PHOS  2.9  --    Liver Function Tests Recent Labs    10/20/19 0923  ALBUMIN 3.0*   No results for input(s): LIPASE, AMYLASE in the last 72 hours. Cardiac Enzymes No results for input(s): CKTOTAL, CKMB, CKMBINDEX, TROPONINI in the last 72 hours. BNP No results for input(s): BNP in the last 72 hours. D-Dimer No results for input(s): DDIMER in the last 72 hours. Hemoglobin A1C No results for input(s): HGBA1C in the last 72 hours. Fasting Lipid Panel No results for input(s): CHOL, HDL, LDLCALC, TRIG, CHOLHDL, LDLDIRECT in the last 72 hours. Thyroid Function Tests No results for input(s): TSH, T4TOTAL, T3FREE, THYROIDAB in the last 72 hours.  Invalid input(s): FREET3  Telemetry    Normal sinus rhythm  ECG    Sinus rhythm with no ischemic changes  Radiology    Dg Chest Port 1 View  Result Date: 10/19/2019 CLINICAL  DATA:  Community acquired pneumonia EXAM: PORTABLE CHEST 1 VIEW COMPARISON:  10/17/2019 FINDINGS: Tip of tracheostomy tube is above the carina. Previous median sternotomy and mitral valvuloplasty. Mild cardiac enlargement. Bilateral pleural effusions and increased interstitial edema is noted. Atelectasis and/or airspace disease noted in the left lower lobe and right lung base. IMPRESSION: 1. Mild CHF. 2. Bilateral lower lobe opacities which may represent atelectasis and/or pneumonia. Electronically Signed   By: Kerby Moors M.D.   On: 10/19/2019 11:33   Dg Chest Portable 1 View  Result Date: 10/17/2019 CLINICAL DATA:  Shortness of breath. Hypoxia. Dialysis patient. EXAM: PORTABLE CHEST 1 VIEW COMPARISON:  Two-view chest x-ray 10/01/2017 FINDINGS: Chronic tracheostomy is noted. The heart is enlarged. Mild pulmonary vascular congestion is present. Bibasilar airspace disease is new. Bilateral pleural effusions are present. There is no significant consolidation in the upper lobes. The visualized soft tissues and bony thorax are unremarkable. IMPRESSION: 1. Cardiomegaly with mild pulmonary vascular congestion and bilateral pleural effusions compatible with congestive heart failure. 2. Bibasilar airspace disease may represent atelectasis, but is concerning for bibasilar pneumonia. Electronically Signed   By: San Morelle M.D.   On: 10/17/2019 05:18    Assessment & Plan    1. CHF-has HFpEF-ef normal ef by [facility] at [facility] in December of last year.Will attempt to control with iv lasix 40 bid. Carvedilol 25 mg bid.Echo showedejection fraction of 55 to 60% with moderate LVH. RV function is normal. Mild to moderate sclerosis of the aortic valve with no aortic stenosis. Estimated RV systolic pressure of [id number] mmHg. BNP is improved to 796. Improving with hemodialysis.  Appears near baseline.  Will dialyze tomorrow.  Consider discharge after dialysis tomorrow if she continues to improve.   2.  Paroxysmal afib-is on apixiban 2.5 mg bid and amiodarone200 mgdaily. Currently in nsr. Will continue with this regimen.   3. Cad/mitral valve disease. -Had ruptured posterior papillary muscle with cardiogenic shock in 2017 . Had inferior posterior stemi in 2013 with a des to [place]. Underwent emergent mvr with a tissue prosthesis due to ruptured posterior papillary muscle. Post op course complicated by ckd and prolonged intubation requiring trach. Currently appears stable from ischemic [place]. Surgical intervention of the mitral valve appears well intact. Conitnue with current outpatient regimen for now. Includes asa 81 mg daily and atorvastatin at 80 mg daily.   4.HTN-currently controlled after hd. Continue with carvedilol at 25 mg dialy and follow pressures.   5. Hyperlipidemia-conitnue with atorvastatin.   6. DM-insultin as is being done.   Signed, Javier Docker Jourdyn Hasler MD 10/21/2019, 12:45 PM  Pager: [phone]

## 2019-10-21 NOTE — TOC Progression Note (Signed)
Transition of Care [facility]) - Progression Note    Patient Details  Name: Deborah [name] MRN: [id number] Date of Birth: 04-13-1956  Transition of Care [facility]) CM/SW [place], LCSW Phone Number: 10/21/2019, 2:01 PM  Clinical Narrative: CSW met with patient. Son at bedside. CSW introduced role and explained that PT recommendations would be discussed. Patient already has an aide through [place] that comes out MWF 8-4:30 pm. They will think about home health PT and OT. Gave CMS scores for agencies that serve her zip code. Will follow up late today/tomorrow morning.     Expected Discharge Plan: Home/Self Care Barriers to Discharge: Continued Medical Work up  Expected Discharge Plan and Services Expected Discharge Plan: Home/Self Care   Discharge Planning Services: CM Consult                                           Social Determinants of Health (SDOH) Interventions    Readmission Risk Interventions Readmission Risk Prevention Plan 10/18/2019  Transportation Screening Complete  PCP or Specialist appointment within 3-5 days of discharge [place] Not Applicable  Some recent data might be hidden

## 2019-10-21 NOTE — Progress Notes (Signed)
Pt sats on 28% aerosol trach collar    96%  Pt sats on roomair sitting in bed    89%  Pt sats walking in room on roomair     86%  Pt stated that she did not have any shortness of breath during entire time checking sats. Respiratory rate 18/min, Heart rate 74/min. Pt placed back on aerosol trach collar at 28% FiO2, sats 96%.

## 2019-10-22 DIAGNOSIS — I10 Essential (primary) hypertension: Secondary | ICD-10-CM

## 2019-10-22 DIAGNOSIS — J810 Acute pulmonary edema: Secondary | ICD-10-CM

## 2019-10-22 DIAGNOSIS — J150 Pneumonia due to Klebsiella pneumoniae: Secondary | ICD-10-CM

## 2019-10-22 DIAGNOSIS — I480 Paroxysmal atrial fibrillation: Secondary | ICD-10-CM

## 2019-10-22 LAB — CBC
HCT: 25 % — ABNORMAL LOW (ref 36.0–46.0)
Hemoglobin: 8.4 g/dL — ABNORMAL LOW (ref 12.0–15.0)
MCH: 29.3 pg (ref 26.0–34.0)
MCHC: 33.6 g/dL (ref 30.0–36.0)
MCV: 87.1 fL (ref 80.0–100.0)
Platelets: 279 10*3/uL (ref 150–400)
RBC: 2.87 MIL/uL — ABNORMAL LOW (ref 3.87–5.11)
RDW: 15.9 % — ABNORMAL HIGH (ref 11.5–15.5)
WBC: 11.4 10*3/uL — ABNORMAL HIGH (ref 4.0–10.5)
nRBC: 0 % (ref 0.0–0.2)

## 2019-10-22 LAB — GLUCOSE, CAPILLARY
Glucose-Capillary: 178 mg/dL — ABNORMAL HIGH (ref 70–99)
Glucose-Capillary: 119 mg/dL — ABNORMAL HIGH (ref 70–99)
Glucose-Capillary: 200 mg/dL — ABNORMAL HIGH (ref 70–99)
Glucose-Capillary: 267 mg/dL — ABNORMAL HIGH (ref 70–99)

## 2019-10-22 LAB — CULTURE, BLOOD (ROUTINE X 2)
Culture: NO GROWTH
Culture: NO GROWTH

## 2019-10-22 LAB — BASIC METABOLIC PANEL
Anion gap: 16 — ABNORMAL HIGH (ref 5–15)
BUN: 61 mg/dL — ABNORMAL HIGH (ref 8–23)
CO2: 25 mmol/L (ref 22–32)
Calcium: 8.9 mg/dL (ref 8.9–10.3)
Chloride: 96 mmol/L — ABNORMAL LOW (ref 98–111)
Creatinine, Ser: 8.01 mg/dL — ABNORMAL HIGH (ref 0.44–1.00)
GFR calc Af Amer: 6 mL/min — ABNORMAL LOW (ref >60)
GFR calc non Af Amer: 5 mL/min — ABNORMAL LOW (ref >60)
Glucose, Bld: 230 mg/dL — ABNORMAL HIGH (ref 70–99)
Potassium: 4.4 mmol/L (ref 3.5–5.1)
Sodium: 137 mmol/L (ref 135–145)

## 2019-10-22 LAB — PROCALCITONIN: Procalcitonin: 0.96 ng/mL

## 2019-10-22 MED ORDER — RENA-VITE PO TABS
1.0000 | ORAL_TABLET | Freq: Every day | ORAL | Status: DC
  Administered 2019-10-22 – 2019-10-23 (×2): 1 via ORAL
  Filled 2019-10-22 (×3): qty 1

## 2019-10-22 MED ORDER — CLINDAMYCIN PHOSPHATE 900 MG/50ML IV SOLN
900.0000 mg | Freq: Three times a day (TID) | INTRAVENOUS | Status: DC
  Administered 2019-10-22 – 2019-10-23 (×2): 900 mg via INTRAVENOUS
  Filled 2019-10-22 (×6): qty 50

## 2019-10-22 MED ORDER — SODIUM CHLORIDE 0.9 % IV SOLN
2.0000 g | INTRAVENOUS | Status: DC
  Administered 2019-10-22: 2 g via INTRAVENOUS
  Filled 2019-10-22: qty 20
  Filled 2019-10-22: qty 2

## 2019-10-22 MED ORDER — NEPRO/CARBSTEADY PO LIQD
237.0000 mL | Freq: Two times a day (BID) | ORAL | Status: DC
  Administered 2019-10-23: 237 mL via ORAL

## 2019-10-22 NOTE — Progress Notes (Signed)
OT Cancellation Note  Patient Details Name: Deborah [name] MRN: [id number] DOB: 1956-04-29   Cancelled Treatment:    Reason Eval/Treat Not Completed: Other (comment)  Pt has just returned from HD when OT presents for therapy treatment. Pt politely declines to engage in therapy at this time citing fatigue following HD.   Gerrianne Scale, MS, OTR/L ascom [phone] or [phone] 10/22/19, 3:08 PM

## 2019-10-22 NOTE — Progress Notes (Signed)
Patient returned from dialysis treatment. No complaints. VSS. Lunch ordered. Meds given. Will continue to monitor.

## 2019-10-22 NOTE — Progress Notes (Signed)
Pre HD Assessment    10/22/19 0945  Neurological  Level of Consciousness Alert  Orientation Level Oriented X4  Respiratory  Respiratory Pattern Regular  Chest Assessment Chest expansion symmetrical  Bilateral Breath Sounds Clear;Diminished  Cough None  Vascular  R Radial Pulse +2  L Radial Pulse +2  Psychosocial  Psychosocial (WDL) WDL

## 2019-10-22 NOTE — Progress Notes (Signed)
Pre HD [facility]   10/22/19 0945  Hand-Off documentation  Report given to (Full Name) Beatris Ship, R  Report received from (Full Name) Pat Patrick, RN   Vital Signs  Temp 98.1 F (36.7 C)  Temp Source Oral  Pulse Rate 72  Pulse Rate Source Monitor  Resp 16  BP 127/62  BP Location Right Arm  BP Method Automatic  Patient Position (if appropriate) Lying  Oxygen Therapy  SpO2 93 %  O2 Device Tracheostomy Collar  O2 Flow Rate (L/min) 5 L/min  FiO2 (%) 28 %  Pulse Oximetry Type Continuous  Pain Assessment  Pain Scale 0-10  Pain Score 0  Dialysis Weight  Weight 65.2 kg  Type of Weight Pre-Dialysis  Time-Out for Hemodialysis  What Procedure? HD   Pt Identifiers(min of two) First/Last Name;MRN/Account#  Correct Site? Yes  Correct Side? Yes  Correct Procedure? Yes  Consents Verified? Yes  Rad Studies Available? N/A  Safety Precautions Reviewed? Yes  Engineer, civil (consulting) Number 3  Station Number 4  UF/Alarm Test Passed  Conductivity: Meter 13.8  Conductivity: Machine  13.8  pH 7.2  Reverse Osmosis Main  Normal Saline Lot Number [id number]  Dialyzer Lot Number [id number]  Disposable Set Lot Number [id number]  Machine Temperature 98.6 F (37 C)  Musician and Audible Yes  Blood Lines Intact and Secured Yes  Pre Treatment Patient Checks  Vascular access used during treatment Fistula  Hepatitis B Surface Antigen Results Negative  Date Hepatitis B Surface Antigen Drawn 10/08/19  Hepatitis B Surface Antibody  (<10)  Date Hepatitis B Surface Antibody Drawn 07/09/19  Hemodialysis Consent Verified Yes  Hemodialysis Standing Orders Initiated Yes  ECG (Telemetry) Monitor On Yes  Prime Ordered Normal Saline  Length of  DialysisTreatment -hour(s) 3.5 Hour(s)  Dialysis Treatment Comments NA 140  Dialyzer Elisio 17H NR  Dialysate 2.5 Ca;3K  Dialysis Anticoagulant None  Dialysate Flow Ordered 800  Blood Flow Rate Ordered 400 mL/min  Ultrafiltration Goal 1.5 Liters  Dialysis Blood  Pressure Support Ordered Normal Saline  Education / Care Plan  Dialysis Education Provided Yes  Documented Education in Care Plan Yes  Fistula / Graft Left Upper arm  Placement Date: 10/02/16   Placed prior to admission: No  Orientation: Left  Access Location: Upper arm  Site Condition No complications  Fistula / Graft Assessment Present;Thrill;Bruit  Status Patent  Drainage Description None

## 2019-10-22 NOTE — Progress Notes (Signed)
Spoke with patient today and noticed that patient is requiring O2 at 6L through trach. Concerned about patient going back to home unit requiring O2 and clinic not being able to support this, DC called [place] to inquire whether this status has changed for patient. Glenard Haring RN stated that patient has a home RN that comes to treatments with patient to assist with suctioning if needed, however patient was capped and did not require O2. Per Theadora Rama, Freight forwarder at [employer], patient will be able to resume at this clinic only if O2 is lower than 5L and home RN will be present with patient the entire treatment. Please contact me with any questions or concerns.  Elvera Bicker  Dialysis Coordinator [phone]

## 2019-10-22 NOTE — Progress Notes (Signed)
Physical Therapy Treatment Patient Details Name: Deborah [name] MRN: [id number] DOB: [birth year] Today's Date: 10/22/2019    History of Present Illness Per MD H&P: Pt is a 63 y.o. African-American female with a known history of type II arrhythmias, hypertension, dyslipidemia and end-stage renal disease on hemodialysis on M,W,F, who presented to the emergency room with acute onset of worsening dyspnea and hypoxia with a pulse ox entry of 81% on room air.  Patient is status post tracheostomy but is not on home O2.  She has been having dry cough here occasionally productive of clear sputum at home.  She admitted to orthopnea and paroxysmal nocturnal dyspnea.  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    PT Comments    Pt presented with deficits in strength, transfers, mobility, gait, balance, and activity tolerance.  Per MD ok for trial wean on room air this session.  Baseline on aerosol trach collar 28%: SpO2 99-100%, HR 76; On room air at rest and with supine/seated therex: SpO2 93-95%, HR 78 bpm; after ambulation 20 feet on room air: SpO2 86%, HR 84 bpm; after sitting for 30 sec on room air: SpO2 95%, HR 80 bpm.  Pt steady with amb with the RW with slow cadence but without LOB and no adverse symptoms noted during the session other than mild fatigue that pt attributed to just having had HD.  Pt returned to supplemental O2 with nsg and MD notified of above results on room air.  Pt will benefit from HHPT services upon discharge to safely address above deficits for decreased caregiver assistance and eventual return to PLOF.      Follow Up Recommendations  Home health PT;Supervision - Intermittent     Equipment Recommendations  None recommended by PT    Recommendations for Other Services       Precautions /  Restrictions Precautions Precautions: Fall Restrictions Weight Bearing Restrictions: No    Mobility  Bed Mobility Overal bed mobility: Modified Independent             General bed mobility comments: Extra time and effort only without use of rail  Transfers Overall transfer level: Needs assistance Equipment used: Rolling walker (2 wheeled) Transfers: Sit to/from Stand Sit to Stand: Supervision         General transfer comment: Good eccentric and concentric control  Ambulation/Gait Ambulation/Gait assistance: Min guard Gait Distance (Feet): 20 Feet Assistive device: Rolling walker (2 wheeled) Gait Pattern/deviations: Step-through pattern;Decreased step length - right;Decreased step length - left Gait velocity: decreased   General Gait Details: Slow cadence with gait with short B step length with no noted instability with the RW   Stairs             Wheelchair Mobility    Modified Rankin (Stroke Patients Only)       Balance Overall balance assessment: Needs assistance Sitting-balance support: No upper extremity supported;Feet supported Sitting balance-Leahy Scale: Normal     Standing balance support: Bilateral upper extremity supported;During functional activity Standing balance-Leahy Scale: Good                              Cognition Arousal/Alertness: Awake/alert Behavior During Therapy: WFL for tasks assessed/performed Overall Cognitive Status: Within Functional Limits for tasks assessed  Exercises Total Joint Exercises Ankle Circles/Pumps: Strengthening;Both;10 reps Quad Sets: Strengthening;Both;10 reps Gluteal Sets: Strengthening;Both;10 reps Towel Squeeze: Strengthening;Both;10 reps Hip ABduction/ADduction: AROM;Both;10 reps Long Arc Quad: Strengthening;Both;10 reps;15 reps Knee Flexion: Strengthening;Both;10 reps;15 reps    General Comments        Pertinent  Vitals/Pain Pain Assessment: No/denies pain    Home Living                      Prior Function            PT Goals (current goals can now be found in the care plan section) Progress towards PT goals: Progressing toward goals    Frequency    Min 2X/week      PT Plan Current plan remains appropriate    Co-evaluation              AM-PAC PT "6 Clicks" Mobility   Outcome Measure  Help needed turning from your back to your side while in a flat bed without using bedrails?: None Help needed moving from lying on your back to sitting on the side of a flat bed without using bedrails?: None Help needed moving to and from a bed to a chair (including a wheelchair)?: A Little Help needed standing up from a chair using your arms (e.g., wheelchair or bedside chair)?: A Little Help needed to walk in hospital room?: A Little Help needed climbing 3-5 steps with a railing? : A Little 6 Click Score: 20    End of Session Equipment Utilized During Treatment: Gait belt;Oxygen Activity Tolerance: Patient tolerated treatment well Patient left: in bed;with call bell/phone within reach;with bed alarm set Nurse Communication: Mobility status PT Visit Diagnosis: Muscle weakness (generalized) (M62.81);Difficulty in walking, not elsewhere classified (R26.2)     Time: [id number] PT Time Calculation (min) (ACUTE ONLY): 27 min  Charges:  $Therapeutic Exercise: 23-37 mins                     D. Royetta Asal PT, DPT 10/22/19, 4:42 PM

## 2019-10-22 NOTE — Progress Notes (Signed)
Post HD Assessment    10/22/19 1335  Neurological  Level of Consciousness Alert  Orientation Level Oriented X4  Respiratory  Respiratory Pattern Regular  Chest Assessment Chest expansion symmetrical  Bilateral Breath Sounds Clear;Diminished  Cough None  Vascular  R Radial Pulse +2  L Radial Pulse +2  Psychosocial  Psychosocial (WDL) WDL

## 2019-10-22 NOTE — Progress Notes (Signed)
HD Tx started w/o issue , pt on humidified air at 6L and 28%   10/22/19 0954  Vital Signs  Pulse Rate 70  Pulse Rate Source Monitor  Resp 16  BP 130/64  BP Location Right Arm  BP Method Automatic  Patient Position (if appropriate) Lying  Oxygen Therapy  O2 Device Tracheostomy Collar  O2 Flow Rate (L/min) 5 L/min  FiO2 (%) 28 %  Pulse Oximetry Type Continuous  During Hemodialysis Assessment  Blood Flow Rate (mL/min) 400 mL/min  Arterial Pressure (mmHg) -160 mmHg  Venous Pressure (mmHg) 130 mmHg  Transmembrane Pressure (mmHg) 60 mmHg  Ultrafiltration Rate (mL/min) 570 mL/min  Dialysate Flow Rate (mL/min) 800 ml/min  Conductivity: Machine  13.8  HD Safety Checks Performed Yes  Dialysis Fluid Bolus Normal Saline  Bolus Amount (mL) 250 mL  Intra-Hemodialysis Comments Tx initiated  Fistula / Graft Left Upper arm  Placement Date: 10/02/16   Placed prior to admission: No  Orientation: Left  Access Location: Upper arm  Site Condition No complications  Fistula / Graft Assessment Present;Thrill;Bruit  Status Accessed  Needle Size 15g   Drainage Description None

## 2019-10-22 NOTE — Progress Notes (Signed)
[facility]  ROUNDING NOTE   Subjective:   Denies any acute complaints Currently requiring high flow oxygen   11/17 0701 - 11/18 0700 In: 580 [P.O.:580] Out: 0    Seen during hD Tolerating well   HEMODIALYSIS FLOWSHEET:  Blood Flow Rate (mL/min): 375 mL/min Arterial Pressure (mmHg): -230 mmHg Venous Pressure (mmHg): 230 mmHg Transmembrane Pressure (mmHg): 70 mmHg Ultrafiltration Rate (mL/min): 580 mL/min Dialysate Flow Rate (mL/min): 600 ml/min Conductivity: Machine : 13.8 Conductivity: Machine : 13.8 Dialysis Fluid Bolus: Normal Saline Bolus Amount (mL): 250 mL     Objective:  Vital signs in last 24 hours:  Temp:  [98 F (36.7 C)-99.3 F (37.4 C)] 99.1 F (37.3 C) (11/18 1600) Pulse Rate:  [66-76] 76 (11/18 1600) Resp:  [10-20] 18 (11/18 1600) BP: (115-167)/(59-74) 134/66 (11/18 1600) SpO2:  [93 %-100 %] 100 % (11/18 1600) FiO2 (%):  [28 %] 28 % (11/18 0954) Weight:  [63.9 kg-65.2 kg] 63.9 kg (11/18 1345)  Weight change: 1.724 kg Filed Weights   10/22/19 0447 10/22/19 0945 10/22/19 1345  Weight: 64.4 kg 65.2 kg 63.9 kg    Intake/Output: I/O last 3 completed shifts: In: 71 [P.O.:580] Out: 0    Intake/Output this shift:  Total I/O In: 240 [P.O.:240] Out: 1500 [Other:1500]  Physical Exam: General: NAD  Head: Normocephalic, atraumatic. Moist oral mucosal membranes  Eyes: Anicteric,  Neck: +tracheostomy  Lungs:  Mild coarse Breath sounds. Improved exam  Heart: Regular rate and rhythm  Abdomen:  Soft, nontender  Extremities:  trace peripheral edema.  Neurologic: Nonfocal, able to follow simple commands  Skin: No lesions  Access: Left AVG    Basic Metabolic Panel: Recent Labs  Lab 10/17/19 0445  10/19/19 0553 10/20/19 0523 10/20/19 0923 10/21/19 0546 10/22/19 0429  NA 140   < > 135 138 138 136 137  K 3.8   < > 4.1 4.2 3.9 4.1 4.4  CL 98   < > 97* 100 100 98 96*  CO2 27   < > 27 24 23 26 25   GLUCOSE 166*   < > 190* 212*  157* 161* 230*  BUN 49*   < > 49* 70* 77* 37* 61*  CREATININE 6.47*   < > 7.46* 8.66* 9.60* 5.88* 8.01*  CALCIUM 8.9   < > 8.3* 9.0 9.0 8.5* 8.9  MG 1.8  --   --   --   --   --   --   PHOS  --   --   --   --  2.9  --   --    < > = values in this interval not displayed.    Liver Function Tests: Recent Labs  Lab 10/20/19 0923  ALBUMIN 3.0*   No results for input(s): LIPASE, AMYLASE in the last 168 hours. No results for input(s): AMMONIA in the last 168 hours.  CBC: Recent Labs  Lab 10/17/19 0445 10/18/19 0537 10/19/19 0553 10/20/19 0523 10/21/19 0546 10/22/19 0429  WBC 12.7* 12.6* 13.2* 14.0* 13.5* 11.4*  NEUTROABS 9.7*  --   --  11.2*  --   --   HGB 9.4* 9.0* 8.3* 8.8* 8.9* 8.4*  HCT 28.4* 26.4* 25.4* 25.6* 25.8* 25.0*  MCV 89.3 87.4 90.4 87.1 86.9 87.1  PLT 250 236 240 263 280 279    Cardiac Enzymes: No results for input(s): CKTOTAL, CKMB, CKMBINDEX, TROPONINI in the last 168 hours.  BNP: Invalid input(s): POCBNP  CBG: Recent Labs  Lab 10/21/19 1629 10/21/19 2130 10/22/19  0745 10/22/19 1421 10/22/19 1748  GLUCAP 248* 188* 178* 119* 200*    Microbiology: Results for orders placed or performed during the hospital encounter of 10/17/19  SARS CORONAVIRUS 2 (TAT 6-24 HRS) Nasopharyngeal Nasopharyngeal Swab     Status: None   Collection Time: 10/17/19  4:45 AM   Specimen: Nasopharyngeal Swab  Result Value Ref Range Status   SARS Coronavirus 2 NEGATIVE NEGATIVE Final    Comment: (NOTE) SARS-CoV-2 target nucleic acids are NOT DETECTED. The SARS-CoV-2 RNA is generally detectable in upper and lower respiratory specimens during the acute phase of infection. Negative results do not preclude SARS-CoV-2 infection, do not rule out co-infections with other pathogens, and should not be used as the sole basis for treatment or other patient management decisions. Negative results must be combined with clinical observations, patient history, and epidemiological  information. The expected result is Negative. Fact Sheet for Patients: SugarRoll.be Fact Sheet for Healthcare Providers: https://www.woods-mathews.com/ This test is not yet approved or cleared by the [place] FDA and  has been authorized for detection and/or diagnosis of SARS-CoV-2 by FDA under an Emergency Use Authorization (EUA). This EUA will remain  in effect (meaning this test can be used) for the duration of the COVID-19 declaration under Section 56 4(b)(1) of the Act, 21 U.S.C. section 360bbb-3(b)(1), unless the authorization is terminated or revoked sooner. Performed at [facility], [place] [address], [place], [place] [address]   Culture, respiratory     Status: None   Collection Time: 10/17/19 12:42 PM   Specimen: Tracheal Aspirate  Result Value Ref Range Status   Specimen Description   Final    TRACHEAL ASPIRATE Performed at [facility], [address] [place], [place] [address]    Special Requests   Final    NONE Performed at [facility], [place], [place] [address]    Gram Stain   Final    ABUNDANT WBC PRESENT, PREDOMINANTLY PMN ABUNDANT GRAM POSITIVE RODS FEW GRAM POSITIVE COCCI Performed at [facility], [place] [address] [place],  [address]    Culture   Final    FEW KLEBSIELLA OXYTOCA FEW STAPHYLOCOCCUS AUREUS    Report Status 10/21/2019 FINAL  Final   Organism ID, Bacteria KLEBSIELLA OXYTOCA  Final   Organism ID, Bacteria STAPHYLOCOCCUS AUREUS  Final      Susceptibility   Klebsiella oxytoca - MIC*    AMPICILLIN >=32 RESISTANT Resistant     CEFAZOLIN 16 SENSITIVE Sensitive     CEFEPIME <=1 SENSITIVE Sensitive     CEFTAZIDIME <=1 SENSITIVE Sensitive     CEFTRIAXONE <=1 SENSITIVE Sensitive     CIPROFLOXACIN <=0.25 SENSITIVE Sensitive     GENTAMICIN <=1 SENSITIVE Sensitive     IMIPENEM <=0.25 SENSITIVE Sensitive     TRIMETH/SULFA <=20 SENSITIVE Sensitive      AMPICILLIN/SULBACTAM 16 INTERMEDIATE Intermediate     PIP/TAZO <=4 SENSITIVE Sensitive     Extended ESBL NEGATIVE Sensitive     * FEW KLEBSIELLA OXYTOCA   Staphylococcus aureus - MIC*    CIPROFLOXACIN <=0.5 SENSITIVE Sensitive     ERYTHROMYCIN <=0.25 SENSITIVE Sensitive     GENTAMICIN <=0.5 SENSITIVE Sensitive     OXACILLIN <=0.25 SENSITIVE Sensitive     TETRACYCLINE <=1 SENSITIVE Sensitive     VANCOMYCIN 1 SENSITIVE Sensitive     TRIMETH/SULFA <=10 SENSITIVE Sensitive     CLINDAMYCIN <=0.25 SENSITIVE Sensitive     RIFAMPIN <=0.5 SENSITIVE Sensitive     Inducible  Clindamycin NEGATIVE Sensitive     * FEW STAPHYLOCOCCUS AUREUS  CULTURE, BLOOD (ROUTINE X 2) w Reflex to ID Panel     Status: None   Collection Time: 10/17/19 12:50 PM   Specimen: BLOOD  Result Value Ref Range Status   Specimen Description BLOOD PORTA CATH  Final   Special Requests   Final    BOTTLES DRAWN AEROBIC AND ANAEROBIC Blood Culture results may not be optimal due to an excessive volume of blood received in culture bottles   Culture   Final    NO GROWTH 5 DAYS Performed at [facility], [address] [place], [place] [address]    Report Status 10/22/2019 FINAL  Final  CULTURE, BLOOD (ROUTINE X 2) w Reflex to ID Panel     Status: None   Collection Time: 10/17/19  2:20 PM   Specimen: BLOOD  Result Value Ref Range Status   Specimen Description BLOOD RT HAND  Final   Special Requests   Final    BOTTLES DRAWN AEROBIC AND ANAEROBIC Blood Culture results may not be optimal due to an inadequate volume of blood received in culture bottles   Culture   Final    NO GROWTH 5 DAYS Performed at [facility], [address] [place], [place] [address]    Report Status 10/22/2019 FINAL  Final  MRSA PCR Screening     Status: None   Collection Time: 10/17/19  9:50 PM   Specimen: Nasopharyngeal  Result Value Ref Range Status   MRSA by PCR NEGATIVE NEGATIVE Final    Comment:        The GeneXpert  MRSA Assay (FDA approved for NASAL specimens only), is one component of a comprehensive MRSA colonization surveillance program. It is not intended to diagnose MRSA infection nor to guide or monitor treatment for MRSA infections. Performed at [facility], [address] [place], [place] [address]   Culture, respiratory     Status: None   Collection Time: 10/18/19  2:45 PM   Specimen: Tracheal Aspirate  Result Value Ref Range Status   Specimen Description   Final    TRACHEAL ASPIRATE Performed at [facility], [address] [place], [place] [address]    Special Requests   Final    NONE Performed at [facility], [place], [place] [address]    Gram Stain   Final    ABUNDANT WBC PRESENT,BOTH PMN AND MONONUCLEAR FEW GRAM POSITIVE COCCI FEW GRAM POSITIVE RODS FEW GRAM NEGATIVE RODS Performed at [facility], [place] [address] [place], [place] [address]    Culture FEW KLEBSIELLA OXYTOCA  Final   Report Status 10/21/2019 FINAL  Final   Organism ID, Bacteria KLEBSIELLA OXYTOCA  Final      Susceptibility   Klebsiella oxytoca - MIC*    AMPICILLIN >=32 RESISTANT Resistant     CEFAZOLIN <=4 SENSITIVE Sensitive     CEFEPIME <=1 SENSITIVE Sensitive     CEFTAZIDIME <=1 SENSITIVE Sensitive     CEFTRIAXONE <=1 SENSITIVE Sensitive     CIPROFLOXACIN <=0.25 SENSITIVE Sensitive     GENTAMICIN <=1 SENSITIVE Sensitive     IMIPENEM <=0.25 SENSITIVE Sensitive     TRIMETH/SULFA <=20 SENSITIVE Sensitive     AMPICILLIN/SULBACTAM 16 INTERMEDIATE Intermediate     PIP/TAZO <=4 SENSITIVE Sensitive     Extended ESBL NEGATIVE Sensitive     * FEW KLEBSIELLA OXYTOCA    Coagulation Studies: No results for input(s): LABPROT, INR  in the last 72 hours.  Urinalysis: No results for input(s): COLORURINE, LABSPEC, PHURINE, GLUCOSEU, HGBUR, BILIRUBINUR, KETONESUR, PROTEINUR, UROBILINOGEN, NITRITE, LEUKOCYTESUR in the last 72 hours.  Invalid input(s):  APPERANCEUR    Imaging: No results found.   Medications:   . sodium chloride 250 mL (10/18/19 0621)  . cefTRIAXone (ROCEPHIN)  IV    . clindamycin (CLEOCIN) IV     . amiodarone  200 mg Oral Daily  . apixaban  2.5 mg Oral BID  . aspirin EC  81 mg Oral Daily  . atorvastatin  80 mg Oral q1800  . carvedilol  25 mg Oral BID  . Chlorhexidine Gluconate Cloth  6 each Topical Q0600  . epoetin (EPOGEN/PROCRIT) injection  10,000 Units Intravenous Q M,W,F-HD  . feeding supplement (NEPRO CARB STEADY)  237 mL Oral BID BM  . furosemide  40 mg Intravenous Q12H  . gabapentin  100 mg Oral BID  . guaiFENesin  600 mg Oral BID  . insulin aspart  0-9 Units Subcutaneous TID WC  . insulin glargine  14 Units Subcutaneous QHS  . ipratropium-albuterol  3 mL Nebulization TID  . Melatonin  5 mg Oral QHS  . multivitamin  1 tablet Oral QHS  . pantoprazole  40 mg Oral BID  . polyethylene glycol  17 g Oral Daily  . senna-docusate  2 tablet Oral QHS  . sevelamer carbonate  1,600 mg Oral TID WC  . sodium chloride flush  3 mL Intravenous Q12H   sodium chloride, acetaminophen, albuterol, ALPRAZolam, chlorpheniramine-HYDROcodone, labetalol, labetalol, midodrine, ondansetron (ZOFRAN) IV, oxyCODONE, QUEtiapine, sodium chloride flush, zolpidem  Assessment/ Plan:  [name] is a 63 y.o. black female with end stage renal disease on hemodialysis, tracheostomy, hypotension, diabetes mellitus type II insulin dependent, diabetic neuropathy,  coronary artery disease, mitral valve repair, atrial flutter on apixaban  [facility] Nephrology MWF [facility] left AVG Chronic tracheostomy  1. End Stage Renal Disease: with pulmonary edema - emergent dialysis on admission - Continue MWF schedule - seen during HD; tolerating well  2. Acute hypoxic Respiratory failure: requiring oxygen. COVID-19 negative chronic tracheostomy  - Klebsiella pneumonia.  Antibiotics as per internal medicine team  3.  Anemia of  chronic kidney disease:   Lab Results  Component Value Date   HGB 8.4 (L) 10/22/2019   - EPO with HD treatments.  4. Secondary Hyperparathyroidism: Lab Results  Component Value Date   PTH 270 (H) 10/03/2017   CALCIUM 8.9 10/22/2019   CAION 1.09 (L) 09/01/2016   PHOS 2.9 10/20/2019   - sevelamer with meals     LOS: 5 Berish Bohman 11/18/20205:57 PM

## 2019-10-22 NOTE — Progress Notes (Signed)
HD Tx completed tolerated well   10/22/19 1330  Vital Signs  Pulse Rate 73  Pulse Rate Source Monitor  Resp 16  BP (!) 159/73  BP Location Right Arm  BP Method Automatic  Patient Position (if appropriate) Lying  Oxygen Therapy  SpO2 100 %  O2 Device Tracheostomy Collar  O2 Flow Rate (L/min) 5 L/min  Pulse Oximetry Type Continuous  During Hemodialysis Assessment  HD Safety Checks Performed Yes  KECN 63.5 KECN  Dialysis Fluid Bolus Normal Saline  Bolus Amount (mL) 250 mL  Intra-Hemodialysis Comments Tx completed  Fistula / Graft Left Upper arm  Placement Date: 10/02/16   Placed prior to admission: No  Orientation: Left  Access Location: Upper arm  Site Condition No complications  Fistula / Graft Assessment Present;Thrill;Bruit  Status Deaccessed  Drainage Description None

## 2019-10-22 NOTE — Progress Notes (Signed)
  Post HD [facility]    10/22/19 1345  Hand-Off documentation  Report given to (Full Name) Loma Newton, RN   Report received from (Full Name) Beatris Ship, RN   Vital Signs  Temp 98 F (36.7 C)  Temp Source Oral  Pulse Rate Source Monitor  BP (!) 160/74  BP Location Right Arm  BP Method Automatic  Patient Position (if appropriate) Lying  Oxygen Therapy  O2 Device Tracheostomy Collar  O2 Flow Rate (L/min) 5 L/min  Pulse Oximetry Type Continuous  Pain Assessment  Pain Scale 0-10  Pain Score 0  Dialysis Weight  Weight 63.9 kg  Type of Weight Post-Dialysis  Post-Hemodialysis Assessment  Rinseback Volume (mL) 250 mL  KECN 63.5 V  Dialyzer Clearance Clear  Duration of HD Treatment -hour(s) 3.5 hour(s)  Hemodialysis Intake (mL) 500 mL  UF Total -Machine (mL) 2000 mL  Net UF (mL) 1500 mL  AVG/AVF Arterial Site Held (minutes) 10 minutes  AVG/AVF Venous Site Held (minutes) 5 minutes

## 2019-10-23 ENCOUNTER — Inpatient Hospital Stay: Payer: Medicare Other

## 2019-10-23 DIAGNOSIS — Z955 Presence of coronary angioplasty implant and graft: Secondary | ICD-10-CM

## 2019-10-23 DIAGNOSIS — E1122 Type 2 diabetes mellitus with diabetic chronic kidney disease: Secondary | ICD-10-CM

## 2019-10-23 DIAGNOSIS — Z952 Presence of prosthetic heart valve: Secondary | ICD-10-CM

## 2019-10-23 DIAGNOSIS — J9601 Acute respiratory failure with hypoxia: Secondary | ICD-10-CM

## 2019-10-23 DIAGNOSIS — I252 Old myocardial infarction: Secondary | ICD-10-CM

## 2019-10-23 DIAGNOSIS — Z8701 Personal history of pneumonia (recurrent): Secondary | ICD-10-CM

## 2019-10-23 DIAGNOSIS — Z992 Dependence on renal dialysis: Secondary | ICD-10-CM

## 2019-10-23 DIAGNOSIS — I2510 Atherosclerotic heart disease of native coronary artery without angina pectoris: Secondary | ICD-10-CM

## 2019-10-23 DIAGNOSIS — Z930 Tracheostomy status: Secondary | ICD-10-CM

## 2019-10-23 DIAGNOSIS — Z227 Latent tuberculosis: Secondary | ICD-10-CM

## 2019-10-23 DIAGNOSIS — N186 End stage renal disease: Secondary | ICD-10-CM

## 2019-10-23 DIAGNOSIS — J90 Pleural effusion, not elsewhere classified: Secondary | ICD-10-CM

## 2019-10-23 DIAGNOSIS — B961 Klebsiella pneumoniae [K. pneumoniae] as the cause of diseases classified elsewhere: Secondary | ICD-10-CM

## 2019-10-23 DIAGNOSIS — T8579XA Infection and inflammatory reaction due to other internal prosthetic devices, implants and grafts, initial encounter: Secondary | ICD-10-CM

## 2019-10-23 DIAGNOSIS — J189 Pneumonia, unspecified organism: Secondary | ICD-10-CM

## 2019-10-23 DIAGNOSIS — Z8619 Personal history of other infectious and parasitic diseases: Secondary | ICD-10-CM

## 2019-10-23 DIAGNOSIS — I4891 Unspecified atrial fibrillation: Secondary | ICD-10-CM

## 2019-10-23 DIAGNOSIS — B9561 Methicillin susceptible Staphylococcus aureus infection as the cause of diseases classified elsewhere: Secondary | ICD-10-CM

## 2019-10-23 LAB — GLUCOSE, CAPILLARY
Glucose-Capillary: 135 mg/dL — ABNORMAL HIGH (ref 70–99)
Glucose-Capillary: 131 mg/dL — ABNORMAL HIGH (ref 70–99)
Glucose-Capillary: 232 mg/dL — ABNORMAL HIGH (ref 70–99)
Glucose-Capillary: 175 mg/dL — ABNORMAL HIGH (ref 70–99)

## 2019-10-23 LAB — BASIC METABOLIC PANEL
Anion gap: 15 (ref 5–15)
BUN: 36 mg/dL — ABNORMAL HIGH (ref 8–23)
CO2: 25 mmol/L (ref 22–32)
Calcium: 8.7 mg/dL — ABNORMAL LOW (ref 8.9–10.3)
Chloride: 96 mmol/L — ABNORMAL LOW (ref 98–111)
Creatinine, Ser: 5.51 mg/dL — ABNORMAL HIGH (ref 0.44–1.00)
GFR calc Af Amer: 9 mL/min — ABNORMAL LOW (ref >60)
GFR calc non Af Amer: 8 mL/min — ABNORMAL LOW (ref >60)
Glucose, Bld: 151 mg/dL — ABNORMAL HIGH (ref 70–99)
Potassium: 4.3 mmol/L (ref 3.5–5.1)
Sodium: 136 mmol/L (ref 135–145)

## 2019-10-23 LAB — CBC
HCT: 25.5 % — ABNORMAL LOW (ref 36.0–46.0)
Hemoglobin: 8.4 g/dL — ABNORMAL LOW (ref 12.0–15.0)
MCH: 30 pg (ref 26.0–34.0)
MCHC: 32.9 g/dL (ref 30.0–36.0)
MCV: 91.1 fL (ref 80.0–100.0)
Platelets: 298 10*3/uL (ref 150–400)
RBC: 2.8 MIL/uL — ABNORMAL LOW (ref 3.87–5.11)
RDW: 16 % — ABNORMAL HIGH (ref 11.5–15.5)
WBC: 10.6 10*3/uL — ABNORMAL HIGH (ref 4.0–10.5)
nRBC: 0 % (ref 0.0–0.2)

## 2019-10-23 LAB — PROCALCITONIN: Procalcitonin: 0.54 ng/mL

## 2019-10-23 NOTE — Evaluation (Signed)
Occupational Therapy Evaluation Patient Details Name: Deborah [name] MRN: [id number] DOB: 1956/06/02 Today's Date: 10/23/2019    History of Present Illness Per MD H&P: Pt is a 63 y.o. African-American female with a known history of type II arrhythmias, hypertension, dyslipidemia and end-stage renal disease on hemodialysis on M,W,F, who presented to the emergency room with acute onset of worsening dyspnea and hypoxia with a pulse ox entry of 81% on room air.  Patient is status post tracheostomy but is not on home O2.  She has been having dry cough here occasionally productive of clear sputum at home.  She admitted to orthopnea and paroxysmal nocturnal dyspnea.  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   Clinical Impression   Deborah [name] was seen for OT evaluation this date. Pt received semi-supine in bed with son at bedside. Pt reports she was independent in all ADLs prior to admission. She endorses using a SPC and a rollator on occasion for mobility, but states she was steadily beginning to walk without them before getting sick. Pt lives with her son in a 1 level apartment home with 8 steps to enter. Pt denies supplemental O2 use at home. Pt reports becoming easily fatigued or out of breath with minimal exertion. Pt currently requires supervision to minimal assist for exertional ADL tasks such as bathing and lower body dressing. She requires moderate cueing for implementation of energy conservation strategies during ADL tasks. Pt educated in energy conservation strategies including: activity pacing, home/routines modifications, work simplification, AE/DME, prioritizing of meaningful occupations, and falls prevention. Handout provided. Pt verbalized understanding but would benefit from additional skilled OT services to maximize  recall and carryover of learned techniques and facilitate implementation of learned techniques into daily routines. Upon discharge, recommend [place] services.       Follow Up Recommendations  Home health OT    Equipment Recommendations  None recommended by OT(Pt has BSC)    Recommendations for Other Services       Precautions / Restrictions Precautions Precautions: Fall Restrictions Weight Bearing Restrictions: No      Mobility Bed Mobility Overal bed mobility: Modified Independent             General bed mobility comments: Pt comes to sitting EOB given minimal increased time/effort to perform. OT provides assistance with management of lines/leads this date.  Transfers Overall transfer level: Needs assistance Equipment used: None Transfers: Sit to/from Stand Sit to Stand: Supervision         General transfer comment: Pt stands at EOB for ~2 min with no LOB or difficulty noted. Side steps at EOB to reposition. Declines further mobility this date.    Balance Overall balance assessment: Needs assistance Sitting-balance support: No upper extremity supported;Feet supported Sitting balance-Leahy Scale: Normal     Standing balance support: Bilateral upper extremity supported;During functional activity;No upper extremity supported Standing balance-Leahy Scale: Good                             ADL either performed or assessed with clinical judgement   ADL                                         General  ADL Comments: Pt requires supervision to min assist for heavy ADL tasks such as dressing and bathing 2/2 increased fatigue and SOB during exertional activity. Pt reports she is generally independent at baseline, but requires some assistance from her son for IADL management such as grocery shopping, etc. Particularly since the start of the COVID-19 pandemic as she tries to limit her time in public as much as possible.     Vision Baseline  Vision/History: Wears glasses Wears Glasses: At all times Patient Visual Report: No change from baseline       Perception     Praxis      Pertinent Vitals/Pain Pain Assessment: No/denies pain     Hand Dominance Right   Extremity/Trunk Assessment Upper Extremity Assessment Upper Extremity Assessment: Generalized weakness   Lower Extremity Assessment Lower Extremity Assessment: Generalized weakness   Cervical / Trunk Assessment Cervical / Trunk Assessment: Normal   Communication Communication Communication: Tracheostomy   Cognition Arousal/Alertness: Awake/alert Behavior During Therapy: WFL for tasks assessed/performed Overall Cognitive Status: Within Functional Limits for tasks assessed                                 General Comments: Pt pleasant, agreeable to OT session, follows multi-step commands consistently t/o assessment.   General Comments  Pt SpO2 remains at 97-99 % throughout session with trach collar in place.    Exercises Other Exercises Other Exercises: Pt educated in falls prevention strategies, safe use of AE/DME for ADL mgt, & energy conservation strategies including the 4 Ps. Handout provided.   Shoulder Instructions      Home Living Family/patient expects to be discharged to:: Private residence Living Arrangements: Children Available Help at Discharge: Personal care attendant;Family;Available 24 hours/day Type of Home: Apartment Home Access: Stairs to enter Entrance Stairs-Number of Steps: 8 Entrance Stairs-Rails: Left Home Layout: One level     Bathroom Shower/Tub: Teacher, early years/pre: Standard     Home Equipment: Environmental consultant - 2 wheels;Walker - 4 wheels;Grab bars - toilet;Cane - quad;Cane - single point          Prior Functioning/Environment Level of Independence: Independent        Comments: Ind Amb without an AD limited community distances, Ind with ADLs, no fall history        OT Problem List:  Decreased strength;Decreased coordination;Cardiopulmonary status limiting activity;Decreased activity tolerance;Decreased safety awareness;Decreased knowledge of use of DME or AE      OT Treatment/Interventions: Self-care/ADL training;Balance training;Therapeutic exercise;Therapeutic activities;Energy conservation;DME and/or AE instruction;Patient/family education    OT Goals(Current goals can be found in the care plan section) Acute Rehab OT Goals Patient Stated Goal: To get stronger and get back to shopping and being active. OT Goal Formulation: With patient Time For Goal Achievement: 11/06/19 Potential to Achieve Goals: Good ADL Goals Pt Will Perform Grooming: sitting;standing;with modified independence(With LRAD PRN for improved safety and functional independence) Pt Will Perform Upper Body Dressing: sitting;with modified independence(With LRAD PRN for improved safety and functional independence) Pt Will Perform Lower Body Dressing: sit to/from stand;with modified independence(With LRAD PRN for improved safety and functional independence) Additional ADL Goal #1: Pt will independently verbalize a plan to implement at least 3 learned energy conservation strategies into her daily routines/home environment for improved safety and functional independence upon hospital DC.  OT Frequency: Min 1X/week   Barriers to D/C: Decreased caregiver support;Inaccessible home environment  Co-evaluation              AM-PAC OT "6 Clicks" Daily Activity     Outcome Measure Help from another person eating meals?: None Help from another person taking care of personal grooming?: None Help from another person toileting, which includes using toliet, bedpan, or urinal?: A Little Help from another person bathing (including washing, rinsing, drying)?: A Little Help from another person to put on and taking off regular upper body clothing?: None Help from another person to put on and taking off  regular lower body clothing?: A Little 6 Click Score: 21   End of Session Equipment Utilized During Treatment: Gait belt  Activity Tolerance: Patient tolerated treatment well Patient left: in bed;with call bell/phone within reach;with family/visitor present;with bed alarm set  OT Visit Diagnosis: Other abnormalities of gait and mobility (R26.89);Muscle weakness (generalized) (M62.81)                Time: [id number] OT Time Calculation (min): 29 min Charges:  OT General Charges $OT Visit: 1 Visit OT Evaluation $OT Eval Moderate Complexity: 1 Mod OT Treatments $Self Care/Home Management : 8-22 mins   Shara Blazing, M.S., OTR/L Ascom: [phone] 10/23/19, 3:51 PM

## 2019-10-23 NOTE — Care Management Important Message (Signed)
Important Message  Patient Details  Name: BRENDI VIALE MRN: [id number] Date of Birth: 1956-03-28   Medicare Important Message Given:  Yes     Dannette Barbara 10/23/2019, 1:33 PM

## 2019-10-23 NOTE — Progress Notes (Signed)
[facility]  ROUNDING NOTE   Subjective:   Denies any acute complaints Currently requiring high flow oxygen- 5 L Voice is much more clear   11/18 0701 - 11/19 0700 In: 240 [P.O.:240] Out: 1500         Objective:  Vital signs in last 24 hours:  Temp:  [98.1 F (36.7 C)-98.7 F (37.1 C)] 98.2 F (36.8 C) (11/19 1553) Pulse Rate:  [59-74] 66 (11/19 1553) Resp:  [18] 18 (11/19 1553) BP: (117-148)/(59-68) 117/59 (11/19 1553) SpO2:  [97 %-100 %] 100 % (11/19 1553) FiO2 (%):  [28 %] 28 % (11/19 1440) Weight:  [63.2 kg] 63.2 kg (11/19 0342)  Weight change: 0.835 kg Filed Weights   10/22/19 0945 10/22/19 1345 10/23/19 0342  Weight: 65.2 kg 63.9 kg 63.2 kg    Intake/Output: I/O last 3 completed shifts: In: 340 [P.O.:340] Out: 1500 [Other:1500]   Intake/Output this shift:  Total I/O In: 720 [P.O.:720] Out: 0   Physical Exam: General: NAD  Head: Normocephalic, atraumatic. Moist oral mucosal membranes  Eyes: Anicteric,  Neck: +tracheostomy  Lungs:  Clear to auscultation. Improved exam  Heart: Regular rate and rhythm  Abdomen:  Soft, nontender  Extremities:  trace peripheral edema.  Neurologic: Nonfocal, able to follow simple commands  Skin: No lesions  Access: Left AVG    Basic Metabolic Panel: Recent Labs  Lab 10/17/19 0445  10/20/19 0523 10/20/19 0923 10/21/19 0546 10/22/19 0429 10/23/19 0639  NA 140   < > 138 138 136 137 136  K 3.8   < > 4.2 3.9 4.1 4.4 4.3  CL 98   < > 100 100 98 96* 96*  CO2 27   < > 24 23 26 25 25   GLUCOSE 166*   < > 212* 157* 161* 230* 151*  BUN 49*   < > 70* 77* 37* 61* 36*  CREATININE 6.47*   < > 8.66* 9.60* 5.88* 8.01* 5.51*  CALCIUM 8.9   < > 9.0 9.0 8.5* 8.9 8.7*  MG 1.8  --   --   --   --   --   --   PHOS  --   --   --  2.9  --   --   --    < > = values in this interval not displayed.    Liver Function Tests: Recent Labs  Lab 10/20/19 0923  ALBUMIN 3.0*   No results for input(s): LIPASE, AMYLASE in  the last 168 hours. No results for input(s): AMMONIA in the last 168 hours.  CBC: Recent Labs  Lab 10/17/19 0445  10/19/19 0553 10/20/19 0523 10/21/19 0546 10/22/19 0429 10/23/19 0639  WBC 12.7*   < > 13.2* 14.0* 13.5* 11.4* 10.6*  NEUTROABS 9.7*  --   --  11.2*  --   --   --   HGB 9.4*   < > 8.3* 8.8* 8.9* 8.4* 8.4*  HCT 28.4*   < > 25.4* 25.6* 25.8* 25.0* 25.5*  MCV 89.3   < > 90.4 87.1 86.9 87.1 91.1  PLT 250   < > 240 263 280 279 298   < > = values in this interval not displayed.    Cardiac Enzymes: No results for input(s): CKTOTAL, CKMB, CKMBINDEX, TROPONINI in the last 168 hours.  BNP: Invalid input(s): POCBNP  CBG: Recent Labs  Lab 10/22/19 1748 10/22/19 2057 10/23/19 0807 10/23/19 1134 10/23/19 1656  GLUCAP 200* 267* 135* 131* 232*    Microbiology: Results for orders  placed or performed during the hospital encounter of 10/17/19  SARS CORONAVIRUS 2 (TAT 6-24 HRS) Nasopharyngeal Nasopharyngeal Swab     Status: None   Collection Time: 10/17/19  4:45 AM   Specimen: Nasopharyngeal Swab  Result Value Ref Range Status   SARS Coronavirus 2 NEGATIVE NEGATIVE Final    Comment: (NOTE) SARS-CoV-2 target nucleic acids are NOT DETECTED. The SARS-CoV-2 RNA is generally detectable in upper and lower respiratory specimens during the acute phase of infection. Negative results do not preclude SARS-CoV-2 infection, do not rule out co-infections with other pathogens, and should not be used as the sole basis for treatment or other patient management decisions. Negative results must be combined with clinical observations, patient history, and epidemiological information. The expected result is Negative. Fact Sheet for Patients: SugarRoll.be Fact Sheet for Healthcare Providers: https://www.woods-mathews.com/ This test is not yet approved or cleared by the [place] FDA and  has been authorized for detection and/or diagnosis of  SARS-CoV-2 by FDA under an Emergency Use Authorization (EUA). This EUA will remain  in effect (meaning this test can be used) for the duration of the COVID-19 declaration under Section 56 4(b)(1) of the Act, 21 U.S.C. section 360bbb-3(b)(1), unless the authorization is terminated or revoked sooner. Performed at [facility], [place] [address] [place], [place] [address]   Culture, respiratory     Status: None   Collection Time: 10/17/19 12:42 PM   Specimen: Tracheal Aspirate  Result Value Ref Range Status   Specimen Description   Final    TRACHEAL ASPIRATE Performed at [facility], [address], [place], [place] [address]    Special Requests   Final    NONE Performed at [facility], [place], [place] [address]    Gram Stain   Final    ABUNDANT WBC PRESENT, PREDOMINANTLY PMN ABUNDANT GRAM POSITIVE RODS FEW GRAM POSITIVE COCCI Performed at [facility], [place] [address] [place],  [address]    Culture   Final    FEW KLEBSIELLA OXYTOCA FEW STAPHYLOCOCCUS AUREUS    Report Status 10/21/2019 FINAL  Final   Organism ID, Bacteria KLEBSIELLA OXYTOCA  Final   Organism ID, Bacteria STAPHYLOCOCCUS AUREUS  Final      Susceptibility   Klebsiella oxytoca - MIC*    AMPICILLIN >=32 RESISTANT Resistant     CEFAZOLIN 16 SENSITIVE Sensitive     CEFEPIME <=1 SENSITIVE Sensitive     CEFTAZIDIME <=1 SENSITIVE Sensitive     CEFTRIAXONE <=1 SENSITIVE Sensitive     CIPROFLOXACIN <=0.25 SENSITIVE Sensitive     GENTAMICIN <=1 SENSITIVE Sensitive     IMIPENEM <=0.25 SENSITIVE Sensitive     TRIMETH/SULFA <=20 SENSITIVE Sensitive     AMPICILLIN/SULBACTAM 16 INTERMEDIATE Intermediate     PIP/TAZO <=4 SENSITIVE Sensitive     Extended ESBL NEGATIVE Sensitive     * FEW KLEBSIELLA OXYTOCA   Staphylococcus aureus - MIC*    CIPROFLOXACIN <=0.5 SENSITIVE Sensitive     ERYTHROMYCIN <=0.25 SENSITIVE Sensitive     GENTAMICIN <=0.5 SENSITIVE Sensitive      OXACILLIN <=0.25 SENSITIVE Sensitive     TETRACYCLINE <=1 SENSITIVE Sensitive     VANCOMYCIN 1 SENSITIVE Sensitive     TRIMETH/SULFA <=10 SENSITIVE Sensitive     CLINDAMYCIN <=0.25 SENSITIVE Sensitive     RIFAMPIN <=0.5 SENSITIVE Sensitive     Inducible Clindamycin NEGATIVE Sensitive     * FEW STAPHYLOCOCCUS AUREUS  CULTURE, BLOOD (ROUTINE X 2) w Reflex  to ID Panel     Status: None   Collection Time: 10/17/19 12:50 PM   Specimen: BLOOD  Result Value Ref Range Status   Specimen Description BLOOD PORTA CATH  Final   Special Requests   Final    BOTTLES DRAWN AEROBIC AND ANAEROBIC Blood Culture results may not be optimal due to an excessive volume of blood received in culture bottles   Culture   Final    NO GROWTH 5 DAYS Performed at [facility] [address] [place], [place] [address]    Report Status 10/22/2019 FINAL  Final  CULTURE, BLOOD (ROUTINE X 2) w Reflex to ID Panel     Status: None   Collection Time: 10/17/19  2:20 PM   Specimen: BLOOD  Result Value Ref Range Status   Specimen Description BLOOD RT HAND  Final   Special Requests   Final    BOTTLES DRAWN AEROBIC AND ANAEROBIC Blood Culture results may not be optimal due to an inadequate volume of blood received in culture bottles   Culture   Final    NO GROWTH 5 DAYS Performed at [facility], [address] [place], [place] [address]    Report Status 10/22/2019 FINAL  Final  MRSA PCR Screening     Status: None   Collection Time: 10/17/19  9:50 PM   Specimen: Nasopharyngeal  Result Value Ref Range Status   MRSA by PCR NEGATIVE NEGATIVE Final    Comment:        The GeneXpert MRSA Assay (FDA approved for NASAL specimens only), is one component of a comprehensive MRSA colonization surveillance program. It is not intended to diagnose MRSA infection nor to guide or monitor treatment for MRSA infections. Performed at [facility], [address] [place], [place] [address]    Culture, respiratory     Status: None   Collection Time: 10/18/19  2:45 PM   Specimen: Tracheal Aspirate  Result Value Ref Range Status   Specimen Description   Final    TRACHEAL ASPIRATE Performed at [facility], [address] [place], [place] [address]    Special Requests   Final    NONE Performed at [facility], [place], [place] [address]    Gram Stain   Final    ABUNDANT WBC PRESENT,BOTH PMN AND MONONUCLEAR FEW GRAM POSITIVE COCCI FEW GRAM POSITIVE RODS FEW GRAM NEGATIVE RODS Performed at [facility], [place] [address] [place], [place] [address]    Culture FEW KLEBSIELLA OXYTOCA  Final   Report Status 10/21/2019 FINAL  Final   Organism ID, Bacteria KLEBSIELLA OXYTOCA  Final      Susceptibility   Klebsiella oxytoca - MIC*    AMPICILLIN >=32 RESISTANT Resistant     CEFAZOLIN <=4 SENSITIVE Sensitive     CEFEPIME <=1 SENSITIVE Sensitive     CEFTAZIDIME <=1 SENSITIVE Sensitive     CEFTRIAXONE <=1 SENSITIVE Sensitive     CIPROFLOXACIN <=0.25 SENSITIVE Sensitive     GENTAMICIN <=1 SENSITIVE Sensitive     IMIPENEM <=0.25 SENSITIVE Sensitive     TRIMETH/SULFA <=20 SENSITIVE Sensitive     AMPICILLIN/SULBACTAM 16 INTERMEDIATE Intermediate     PIP/TAZO <=4 SENSITIVE Sensitive     Extended ESBL NEGATIVE Sensitive     * FEW KLEBSIELLA OXYTOCA    Coagulation Studies: No results for input(s): LABPROT, INR in the last 72 hours.  Urinalysis: No results for input(s): COLORURINE, LABSPEC, [place], [place], [place], [place], [place], [place],  UROBILINOGEN, NITRITE, LEUKOCYTESUR in the last 72 hours.  Invalid input(s): APPERANCEUR    Imaging: Dg Chest Port 1 View  Result Date: 10/23/2019 CLINICAL DATA:  Short of breath. Hypoxia. EXAM: PORTABLE CHEST 1 VIEW COMPARISON:  Radiograph 10/19/2019 FINDINGS: Tracheostomy tube unchanged in position. Sternotomy wires overlies stable cardiac silhouette. There is improvement in LEFT basilar  atelectasis. Decrease in small effusions. Mild bibasilar atelectasis remains. No edema infiltrate or pneumothorax. IMPRESSION: 1. Improvement in LEFT basilar atelectasis and decreased effusions. 2. Persistent mild bibasilar atelectasis. Electronically Signed   By: Suzy Bouchard M.D.   On: 10/23/2019 09:56     Medications:   . sodium chloride 250 mL (10/22/19 2004)   . amiodarone  200 mg Oral Daily  . apixaban  2.5 mg Oral BID  . aspirin EC  81 mg Oral Daily  . atorvastatin  80 mg Oral q1800  . carvedilol  25 mg Oral BID  . Chlorhexidine Gluconate Cloth  6 each Topical Q0600  . epoetin (EPOGEN/PROCRIT) injection  10,000 Units Intravenous Q M,W,F-HD  . feeding supplement (NEPRO CARB STEADY)  237 mL Oral BID BM  . furosemide  40 mg Intravenous Q12H  . gabapentin  100 mg Oral BID  . guaiFENesin  600 mg Oral BID  . insulin aspart  0-9 Units Subcutaneous TID WC  . insulin glargine  14 Units Subcutaneous QHS  . ipratropium-albuterol  3 mL Nebulization TID  . Melatonin  5 mg Oral QHS  . multivitamin  1 tablet Oral QHS  . pantoprazole  40 mg Oral BID  . polyethylene glycol  17 g Oral Daily  . senna-docusate  2 tablet Oral QHS  . sevelamer carbonate  1,600 mg Oral TID WC  . sodium chloride flush  3 mL Intravenous Q12H   sodium chloride, acetaminophen, albuterol, ALPRAZolam, chlorpheniramine-HYDROcodone, labetalol, labetalol, midodrine, ondansetron (ZOFRAN) IV, oxyCODONE, QUEtiapine, sodium chloride flush, zolpidem  Assessment/ Plan:  Deborah [name] is a 63 y.o. black female with end stage renal disease on hemodialysis, tracheostomy, hypotension, diabetes mellitus type II insulin dependent, diabetic neuropathy,  coronary artery disease, mitral valve repair, atrial fibrrilation (anticoagulation with apixaban)  [facility] Nephrology MWF [facility] left AVG Chronic tracheostomy  1. End Stage Renal Disease: with pulmonary edema - emergent dialysis on admission - Continue MWF  schedule - seen during HD; tolerating well  2. Acute hypoxic Respiratory failure: requiring oxygen. COVID-19 negative chronic tracheostomy  - Klebsiella pneumonia.  Antibiotics as per internal medicine team  3.  Anemia of chronic kidney disease:   Lab Results  Component Value Date   HGB 8.4 (L) 10/23/2019   - EPO with HD treatments.  4. Secondary Hyperparathyroidism: Lab Results  Component Value Date   PTH 270 (H) 10/03/2017   CALCIUM 8.7 (L) 10/23/2019   CAION 1.09 (L) 09/01/2016   PHOS 2.9 10/20/2019   - sevelamer with meals     LOS: 6 Kawehi Hostetter 11/19/20206:16 PM

## 2019-10-23 NOTE — Progress Notes (Signed)
Patient Name: Deborah [name] Date of Encounter: 10/23/2019  Hospital Problem List     Active Problems:   Acute CHF (congestive heart failure) [facility])    Patient Profile       Subjective     Inpatient Medications    . amiodarone  200 mg Oral Daily  . apixaban  2.5 mg Oral BID  . aspirin EC  81 mg Oral Daily  . atorvastatin  80 mg Oral q1800  . carvedilol  25 mg Oral BID  . Chlorhexidine Gluconate Cloth  6 each Topical Q0600  . epoetin (EPOGEN/PROCRIT) injection  10,000 Units Intravenous Q M,W,F-HD  . feeding supplement (NEPRO CARB STEADY)  237 mL Oral BID BM  . furosemide  40 mg Intravenous Q12H  . gabapentin  100 mg Oral BID  . guaiFENesin  600 mg Oral BID  . insulin aspart  0-9 Units Subcutaneous TID WC  . insulin glargine  14 Units Subcutaneous QHS  . ipratropium-albuterol  3 mL Nebulization TID  . Melatonin  5 mg Oral QHS  . multivitamin  1 tablet Oral QHS  . pantoprazole  40 mg Oral BID  . polyethylene glycol  17 g Oral Daily  . senna-docusate  2 tablet Oral QHS  . sevelamer carbonate  1,600 mg Oral TID WC  . sodium chloride flush  3 mL Intravenous Q12H    Vital Signs    Vitals:   10/22/19 1926 10/22/19 2300 10/23/19 0342 10/23/19 0807  BP: 136/68  128/63 (!) 148/65  Pulse: 74  63 (!) 59  Resp:    18  Temp: 98.7 F (37.1 C)  98.1 F (36.7 C) 98.3 F (36.8 C)  TempSrc: Oral  Oral Oral  SpO2: 99% 97% 99% 100%  Weight:   63.2 kg   Height:        Intake/Output Summary (Last 24 hours) at 10/23/2019 1509 Last data filed at 10/23/2019 1348 Gross per 24 hour  Intake 720 ml  Output 0 ml  Net 720 ml   Filed Weights   10/22/19 0945 10/22/19 1345 10/23/19 0342  Weight: 65.2 kg 63.9 kg 63.2 kg    Physical Exam     Labs    CBC Recent Labs    10/22/19 0429 10/23/19 0639  WBC 11.4* 10.6*  HGB 8.4* 8.4*  HCT 25.0* 25.5*  MCV 87.1 91.1  PLT 279 [id number]   Basic Metabolic Panel Recent Labs    10/22/19 0429 10/23/19 0639  NA 137 136  K 4.4 4.3   CL 96* 96*  CO2 25 25  GLUCOSE 230* 151*  BUN 61* 36*  CREATININE 8.01* 5.51*  CALCIUM 8.9 8.7*   Liver Function Tests No results for input(s): AST, ALT, ALKPHOS, BILITOT, PROT, ALBUMIN in the last 72 hours. No results for input(s): LIPASE, AMYLASE in the last 72 hours. Cardiac Enzymes No results for input(s): CKTOTAL, CKMB, CKMBINDEX, TROPONINI in the last 72 hours. BNP No results for input(s): BNP in the last 72 hours. D-Dimer No results for input(s): DDIMER in the last 72 hours. Hemoglobin A1C No results for input(s): HGBA1C in the last 72 hours. Fasting Lipid Panel No results for input(s): CHOL, HDL, LDLCALC, TRIG, CHOLHDL, LDLDIRECT in the last 72 hours. Thyroid Function Tests No results for input(s): TSH, T4TOTAL, T3FREE, THYROIDAB in the last 72 hours.  Invalid input(s): FREET3  Telemetry   nsr  ECG    nsr  Radiology    Dg Chest Port 1 View  Result Date: 10/23/2019  CLINICAL DATA:  Short of breath. Hypoxia. EXAM: PORTABLE CHEST 1 VIEW COMPARISON:  Radiograph 10/19/2019 FINDINGS: Tracheostomy tube unchanged in position. Sternotomy wires overlies stable cardiac silhouette. There is improvement in LEFT basilar atelectasis. Decrease in small effusions. Mild bibasilar atelectasis remains. No edema infiltrate or pneumothorax. IMPRESSION: 1. Improvement in LEFT basilar atelectasis and decreased effusions. 2. Persistent mild bibasilar atelectasis. Electronically Signed   By: Suzy Bouchard M.D.   On: 10/23/2019 09:56   Dg Chest Port 1 View  Result Date: 10/19/2019 CLINICAL DATA:  Community acquired pneumonia EXAM: PORTABLE CHEST 1 VIEW COMPARISON:  10/17/2019 FINDINGS: Tip of tracheostomy tube is above the carina. Previous median sternotomy and mitral valvuloplasty. Mild cardiac enlargement. Bilateral pleural effusions and increased interstitial edema is noted. Atelectasis and/or airspace disease noted in the left lower lobe and right lung base. IMPRESSION: 1. Mild CHF. 2.  Bilateral lower lobe opacities which may represent atelectasis and/or pneumonia. Electronically Signed   By: Kerby Moors M.D.   On: 10/19/2019 11:33   Dg Chest Portable 1 View  Result Date: 10/17/2019 CLINICAL DATA:  Shortness of breath. Hypoxia. Dialysis patient. EXAM: PORTABLE CHEST 1 VIEW COMPARISON:  Two-view chest x-ray 10/01/2017 FINDINGS: Chronic tracheostomy is noted. The heart is enlarged. Mild pulmonary vascular congestion is present. Bibasilar airspace disease is new. Bilateral pleural effusions are present. There is no significant consolidation in the upper lobes. The visualized soft tissues and bony thorax are unremarkable. IMPRESSION: 1. Cardiomegaly with mild pulmonary vascular congestion and bilateral pleural effusions compatible with congestive heart failure. 2. Bibasilar airspace disease may represent atelectasis, but is concerning for bibasilar pneumonia. Electronically Signed   By: San Morelle M.D.   On: 10/17/2019 05:18    Assessment & Plan    1.CHF-has HFpEF-ef normal ef by [facility] at [facility] in December of last year.Will attempt to control with iv lasix 40 bid. Carvedilol 25 mg bid.Echo showedejection fraction of 55 to 60% with moderate LVH. RV function is normal. Mild to moderate sclerosis of the aortic valve with no aortic stenosis. Estimated RV systolic pressure of [id number] mmHg. BNP is improvedto 796. Improving with hemodialysis.    2.Paroxysmal afib-is on apixiban 2.5 mg bid and amiodarone200 mgdaily. Currently in nsr. Will continue with this regimen.   3.Cad/mitral valve disease. -Had ruptured posterior papillarymuscle with cardiogenic shock in 2017 . Had inferior posterior stemi in 2013 with a des to [place]. Underwent emergent mvr with a tissue prosthesis due to ruptured posterior papillary muscle. Post op course complicated by ckd and prolonged intubation requiring trach. Currently appears stable from ischemic [place]. Surgical intervention of the  mitral valve appears well intact. Conitnue with current outpatient regimen for now. Includes asa 81 mg daily and atorvastatin at 80 mg daily.   4.HTN-currently controlled after hd. Continue with carvedilol at 25 mg dialy and follow pressures.   5.Hyperlipidemia-conitnue with atorvastatin.   6.DM-insultin as is being done.    Signed, Javier Docker Fath MD 10/23/2019, 3:09 PM  Pager: [phone]

## 2019-10-23 NOTE — Consult Note (Signed)
NAME: Deborah [name]  DOB: 1956/02/22  MRN: [id number]  Date/Time: 10/23/2019 2:56 PM  REQUESTING PROVIDER: Dr. Benny Lennert Subjective:  REASON FOR CONSULT: Klebsiella and MSSA in tracheal aspirate with pneumonia ? Deborah [name] is a 63 y.o. 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 fib, diabetes mellitus, presented to ED on 10/17/2019 via EMS with complaints of shortness of breath, and cough.  EMS reported an oxygen saturation of 91% on room air upon their arrival.  She was placed on 15 L oxygen nonrebreather and it improved to the low 90s.  Patient did not have any body ache fever or chills.  In the ED her vitals were blood pressure 184/95, heart rate of 89, temperature of 99.2, sats of 96%.  Her temperature later became 100.6.  Labs revealed a WBC of 12.7, hemoglobin of 9.4, glucose of 166, creatinine of 6.47.Marland Kitchen  EKG was sinus rhythm.  Chest x-ray revealed cardiomegaly with mild pulmonary vascular congestion and bilateral pleural effusion compatible with congestive heart failure.  There was also question of bibasilar airspace disease may represent atelectasis but was concerning for bibasilar pneumonia.  She was started on vancomycin and cefepime. She has a tracheostomy and the tracheal aspirate culture showed Klebsiella oxytoca as well as staph aureus.  Patient is currently on ceftriaxone and clindamycin and I am asked to  see the patient for further recommendation.  Patient says on the day of admission she was doing okay in the morning but was then feeling very tired and at nighttime she could not lay flat and was very short of breath and hence EMS was called.  She denied any fever or chills.  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 Coronary artery disease status post stent Tracheal stenosis Previous heart failure and respiratory failure needing respiratory support with vent Staph aureus bacteremia in 2017 Past Surgical History:  Procedure Laterality Date   A/V FISTULAGRAM N/A 10/05/2017   Procedure: A/V Fistulagram;  Surgeon: Katha Cabal, MD;  Location: [place] [facility];  Service: Cardiovascular;  Laterality: N/A;   A/V SHUNTOGRAM N/A 10/05/2017   Procedure: A/V SHUNTOGRAM;  Surgeon: Katha Cabal, MD;  Location: [place] [facility];  Service: Cardiovascular;  Laterality: N/A;   AV FISTULA PLACEMENT Left 10/02/2016   Procedure: INSERTION OF ARTERIOVENOUS (AV) GORE-TEX GRAFT ARM;  Surgeon: Waynetta Sandy, MD;  Location: [place];  Service: Vascular;  Laterality: Left;   CARDIAC CATHETERIZATION N/A 08/15/2016   Procedure: Left Heart Cath and Coronary Angiography;  Surgeon: Yolonda Kida, MD;  Location: [place] [facility];  Service: Cardiovascular;  Laterality: N/A;   CARDIAC CATHETERIZATION N/A 08/15/2016   Procedure: Coronary Stent Intervention;  Surgeon: Yolonda Kida, MD;  Location: [place] [facility];  Service: Cardiovascular;  Laterality:  N/A;   CARDIAC CATHETERIZATION N/A 08/18/2016   Procedure: Right Heart Cath;  Surgeon: Jolaine Artist, MD;  Location: [place] [facility];  Service: Cardiovascular;  Laterality: N/A;   CARDIAC CATHETERIZATION N/A  08/18/2016   Procedure: IABP Insertion;  Surgeon: Jolaine Artist, MD;  Location: [place] [facility];  Service: Cardiovascular;  Laterality: N/A;   CARDIAC CATHETERIZATION Right 08/23/2016   Procedure: CENTRAL LINE INSERTION RIGHT SUBCLAVIAN;  Surgeon: Ivin Poot, MD;  Location: [place];  Service: Open Heart Surgery;  Laterality: Right;   ENDOVEIN HARVEST OF GREATER SAPHENOUS VEIN Left 08/23/2016   Procedure: ENDOVEIN HARVEST OF GREATER SAPHENOUS VEIN;  Surgeon: Ivin Poot, MD;  Location: [place];  Service: Open Heart Surgery;  Laterality: Left;   INSERTION OF DIALYSIS CATHETER Bilateral 08/31/2016   Procedure: INSERTION OF DIALYSIS CATHETER LEFT INTERNAL JUGULAR VEIN & INSERTION OF TRIPLE LUMEN RIGHT INTERNAL JUGULAR VEIN;  Surgeon: Angelia Mould, MD;  Location: [place];  Service: Vascular;  Laterality: Bilateral;   INTRAOPERATIVE TRANSESOPHAGEAL ECHOCARDIOGRAM N/A 08/23/2016   Procedure: INTRAOPERATIVE TRANSESOPHAGEAL ECHOCARDIOGRAM;  Surgeon: Ivin Poot, MD;  Location: [place];  Service: Open Heart Surgery;  Laterality: N/A;   IR GENERIC HISTORICAL  11/13/2016   IR US GUIDE VASC ACCESS LEFT 11/13/2016 Corrie Mckusick, DO [facility]   IR GENERIC HISTORICAL  11/13/2016   IR FLUORO GUIDE CV LINE LEFT 11/13/2016 Corrie Mckusick, DO [facility]   IR GENERIC HISTORICAL  11/13/2016   IR GASTROSTOMY TUBE MOD SED 11/13/2016 Corrie Mckusick, DO [facility]   MITRAL VALVE REPAIR N/A 08/23/2016   Procedure: MITRAL VALVE REPAIR (MVR) USING 25MM EDWARDS MAGNA EASE BIOPROSTHESIS MITRAL  VALVE;  Surgeon: Ivin Poot, MD;  Location: [place];  Service: Open Heart Surgery;  Laterality: N/A;   tracheostomy reversal     TRACHEOSTOMY TUBE PLACEMENT N/A 11/09/2016   Procedure: TRACHEOSTOMY;  Surgeon: Melissa Montane, MD;  Location: [place];  Service: ENT;  Laterality: N/A;   VAGINAL DELIVERY     x 6    Social History   Socioeconomic History   Marital status: Widowed    Spouse name: Not on  file   Number of children: 6   Years of education: Not on file   Highest education level: Not on file  Occupational History   Not on file  Social Needs   Financial resource strain: Not on file   Food insecurity    Worry: Not on file    Inability: Not on file   Transportation needs    Medical: Not on file    Non-medical: Not on file  Tobacco Use   Smoking status: Never Smoker   Smokeless tobacco: Never Used  Substance and Sexual Activity   Alcohol use: No   Drug use: No   Sexual activity: Not on file  Lifestyle   Physical activity    Days per week: Not on file    Minutes per session: Not on file   Stress: Not on file  Relationships   Social connections    Talks on phone: Not on file    Gets together: Not on file    Attends religious service: Not on file    Active member of club or organization: Not on file    Attends meetings of clubs or organizations: Not on file    Relationship status: Not on file   Intimate partner violence    Fear of current or ex partner: Not on file  Emotionally abused: Not on file    Physically abused: Not on file    Forced sexual activity: Not on file  Other Topics Concern   Not on file  Social History Narrative   Lives in [place]. Has 2 daughtesr, 3 grandkids.    Family History  Problem Relation Age of Onset   Hypertension Mother    Diabetes Mother    Breast cancer Sister    No Known Allergies  ? Current Facility-Administered Medications  Medication Dose Route Frequency Provider Last Rate Last Dose   0.9 %  sodium chloride infusion  250 mL Intravenous PRN Mansy, Jan A, MD 10 mL/hr at 10/22/19 2004 250 mL at 10/22/19 2004   acetaminophen (TYLENOL) tablet 650 mg  650 mg Oral Q4H PRN Mansy, Jan A, MD   650 mg at 10/18/19 0824   albuterol (PROVENTIL) (2.5 MG/3ML) 0.083% nebulizer solution 2.5 mg  2.5 mg Nebulization Q4H PRN Irene Pap N, DO       ALPRAZolam Duanne Moron) tablet 0.25 mg  0.25 mg Oral BID PRN Mansy,  Jan A, MD   0.25 mg at 10/22/19 0854   amiodarone (PACERONE) tablet 200 mg  200 mg Oral Daily Mansy, Jan A, MD   200 mg at 10/23/19 0947   apixaban (ELIQUIS) tablet 2.5 mg  2.5 mg Oral BID Irene Pap N, DO   2.5 mg at 10/23/19 0947   aspirin EC tablet 81 mg  81 mg Oral Daily Mansy, Jan A, MD   81 mg at 10/23/19 0949   atorvastatin (LIPITOR) tablet 80 mg  80 mg Oral q1800 Mansy, Jan A, MD   80 mg at 10/22/19 1838   carvedilol (COREG) tablet 25 mg  25 mg Oral BID Mansy, Jan A, MD   25 mg at 10/23/19 0947   cefTRIAXone (ROCEPHIN) 2 g in sodium chloride 0.9 % 100 mL IVPB  2 g Intravenous Q24H Swayze, Ava, DO 200 mL/hr at 10/22/19 2118 2 g at 10/22/19 2118   Chlorhexidine Gluconate Cloth 2 % PADS 6 each  6 each Topical Q0600 Kolluru, Lurena Nida, MD   6 each at 10/22/19 0636   chlorpheniramine-HYDROcodone (TUSSIONEX) 10-8 MG/5ML suspension 5 mL  5 mL Oral Q12H PRN Mansy, Jan A, MD       clindamycin (CLEOCIN) IVPB 900 mg  900 mg Intravenous Q8H Swayze, Ava, DO 100 mL/hr at 10/23/19 0330 900 mg at 10/23/19 0330   epoetin alfa (EPOGEN) injection 10,000 Units  10,000 Units Intravenous Q M,W,F-HD Kolluru, Sarath, MD   10,000 Units at 10/22/19 1200   feeding supplement (NEPRO CARB STEADY) liquid 237 mL  237 mL Oral BID BM Swayze, Ava, DO   237 mL at 10/23/19 1422   furosemide (LASIX) injection 40 mg  40 mg Intravenous Q12H Mansy, Jan A, MD   40 mg at 10/23/19 0830   gabapentin (NEURONTIN) capsule 100 mg  100 mg Oral BID Mansy, Jan A, MD   100 mg at 10/23/19 0947   guaiFENesin (MUCINEX) 12 hr tablet 600 mg  600 mg Oral BID Mansy, Jan A, MD   600 mg at 10/23/19 0947   insulin aspart (novoLOG) injection 0-9 Units  0-9 Units Subcutaneous TID WC Mansy, Jan A, MD   1 Units at 10/23/19 1409   insulin glargine (LANTUS) injection 14 Units  14 Units Subcutaneous QHS Mansy, Jan A, MD   14 Units at 10/22/19 2216   ipratropium-albuterol (DUONEB) 0.5-2.5 (3) MG/3ML nebulizer solution 3 mL  3 mL Nebulization  TID  Irene Pap N, DO   3 mL at 10/23/19 1438   labetalol (NORMODYNE) injection 20 mg  20 mg Intravenous Q3H PRN Mansy, Jan A, MD       labetalol (NORMODYNE) injection 20 mg  20 mg Intravenous Q3H PRN Mansy, Jan A, MD       Melatonin TABS 5 mg  5 mg Oral QHS Mansy, Jan A, MD   5 mg at 10/22/19 2215   midodrine (PROAMATINE) tablet 5 mg  5 mg Oral Daily PRN Murlean Iba, MD       multivitamin (RENA-VIT) tablet 1 tablet  1 tablet Oral QHS Swayze, Ava, DO   1 tablet at 10/22/19 2214   ondansetron (ZOFRAN) injection 4 mg  4 mg Intravenous Q6H PRN Mansy, Jan A, MD   4 mg at 10/17/19 1359   oxyCODONE (Oxy IR/ROXICODONE) immediate release tablet 5 mg  5 mg Oral Q6H PRN [place] Crane, Carole N, DO       pantoprazole (PROTONIX) EC tablet 40 mg  40 mg Oral BID Mansy, Jan A, MD   40 mg at 10/23/19 0947   polyethylene glycol (MIRALAX / GLYCOLAX) packet 17 g  17 g Oral Daily Mansy, Jan A, MD       QUEtiapine (SEROQUEL) tablet 25 mg  25 mg Oral TID PRN Mansy, Arvella Merles, MD       senna-docusate (Senokot-S) tablet 2 tablet  2 tablet Oral QHS Irene Pap N, DO   2 tablet at 10/22/19 2203   sevelamer carbonate (RENVELA) tablet 1,600 mg  1,600 mg Oral TID WC Mansy, Jan A, MD   1,600 mg at 10/23/19 1409   sodium chloride flush (NS) 0.9 % injection 3 mL  3 mL Intravenous Q12H Mansy, Jan A, MD   3 mL at 10/22/19 2204   sodium chloride flush (NS) 0.9 % injection 3 mL  3 mL Intravenous PRN Mansy, Jan A, MD       zolpidem (AMBIEN) tablet 5 mg  5 mg Oral QHS PRN Mansy, Arvella Merles, MD         Abtx:  Anti-infectives (From admission, onward)   Start     Dose/Rate Route Frequency Ordered Stop   10/22/19 1900  cefTRIAXone (ROCEPHIN) 2 g in sodium chloride 0.9 % 100 mL IVPB     2 g 200 mL/hr over 30 Minutes Intravenous Every 24 hours 10/22/19 1743     10/22/19 1900  clindamycin (CLEOCIN) IVPB 900 mg     900 mg 100 mL/hr over 30 Minutes Intravenous Every 8 hours 10/22/19 1743     10/17/19 0830  cefTRIAXone (ROCEPHIN) 2 g  in sodium chloride 0.9 % 100 mL IVPB     2 g 200 mL/hr over 30 Minutes Intravenous Every 24 hours 10/17/19 0610 10/21/19 1122   10/17/19 0745  vancomycin (VANCOCIN) 500 mg in sodium chloride 0.9 % 100 mL IVPB     500 mg 100 mL/hr over 60 Minutes Intravenous  Once 10/17/19 0554 10/17/19 0916   10/17/19 0615  azithromycin (ZITHROMAX) 500 mg in sodium chloride 0.9 % 250 mL IVPB     500 mg 250 mL/hr over 60 Minutes Intravenous Every 24 hours 10/17/19 0610 10/21/19 0749   10/17/19 0545  vancomycin (VANCOCIN) IVPB 1000 mg/200 mL premix     1,000 mg 200 mL/hr over 60 Minutes Intravenous  Once 10/17/19 0539 10/17/19 0746   10/17/19 0545  ceFEPIme (MAXIPIME) 2 g in sodium chloride 0.9 % 100 mL IVPB     2  g 200 mL/hr over 30 Minutes Intravenous  Once 10/17/19 0539 10/17/19 0622      REVIEW OF SYSTEMS:  Const: negative fever, negative chills, negative weight loss Eyes: negative diplopia or visual changes, negative eye pain ENT: negative coryza, negative sore throat Resp: negative cough, hemoptysis, has dyspnea Cards: negative for chest pain, palpitations, lower extremity edema GU: negative for frequency, dysuria and hematuria GI: Negative for abdominal pain, diarrhea, bleeding, constipation Skin: negative for rash and pruritus Heme: negative for easy bruising and gum/nose bleeding MS: Generalized weakness Neurolo:negative for headaches, dizziness, vertigo, memory problems  Psych: negative for feelings of anxiety, depression  Endocrine: negative for thyroid, diabetes Allergy/Immunology- negative for any medication or food allergies  Objective:  VITALS:  BP (!) 148/65 (BP Location: Right Arm)    Pulse (!) 59    Temp 98.3 F (36.8 C) (Oral)    Resp 18    Ht 5\' 2"  (1.575 m)    Wt 63.2 kg    SpO2 100%    BMI 25.50 kg/m  PHYSICAL EXAM:  General: Alert, cooperative, no distress, appears stated age.  Head: Normocephalic, without obvious abnormality, atraumatic. Eyes: Conjunctivae clear,  anicteric sclerae. Pupils are equal ENT Nares normal. No drainage or sinus tenderness. Tracheostomy site covered with Shiley Lips, mucosa, and tongue normal. No Thrush Neck: Supple, symmetrical, no adenopathy, thyroid: non tender no carotid bruit and no JVD. Back: No CVA tenderness. Lungs: Bilateral air entry decreased bases.  Occasional rhonchi Heart: Regular rate and rhythm, no murmur, rub or gallop. Sternal scar Abdomen: Soft, non-tender,not distended. Bowel sounds normal. No masses Extremities: atraumatic, no cyanosis. No edema. No clubbing Skin: No rashes or lesions. Or bruising Lymph: Cervical, supraclavicular normal. Neurologic: Grossly non-focal Pertinent Labs Lab Results CBC    Component Value Date/Time   WBC 10.6 (H) 10/23/2019 0639   RBC 2.80 (L) 10/23/2019 0639   HGB 8.4 (L) 10/23/2019 0639   HCT 25.5 (L) 10/23/2019 0639   PLT 298 10/23/2019 0639   MCV 91.1 10/23/2019 0639   MCH 30.0 10/23/2019 0639   MCHC 32.9 10/23/2019 0639   RDW 16.0 (H) 10/23/2019 0639   LYMPHSABS 1.1 10/20/2019 0523   MONOABS 1.1 (H) 10/20/2019 0523   EOSABS 0.4 10/20/2019 0523   BASOSABS 0.1 10/20/2019 0523    CMP Latest Ref Rng & Units 10/23/2019 10/22/2019 10/21/2019  Glucose 70 - 99 mg/dL 151(H) 230(H) 161(H)  BUN 8 - 23 mg/dL 36(H) 61(H) 37(H)  Creatinine 0.44 - 1.00 mg/dL 5.51(H) 8.01(H) 5.88(H)  Sodium 135 - 145 mmol/L 136 137 136  Potassium 3.5 - 5.1 mmol/L 4.3 4.4 4.1  Chloride 98 - 111 mmol/L 96(L) 96(L) 98  CO2 22 - 32 mmol/L 25 25 26   Calcium 8.9 - 10.3 mg/dL 8.7(L) 8.9 8.5(L)  Total Protein 6.5 - 8.1 g/dL - - -  Total Bilirubin 0.3 - 1.2 mg/dL - - -  Alkaline Phos 38 - 126 U/L - - -  AST 15 - 41 U/L - - -  ALT 14 - 54 U/L - - -      Microbiology: Recent Results (from the past 240 hour(s))  SARS CORONAVIRUS 2 (TAT 6-24 HRS) Nasopharyngeal Nasopharyngeal Swab     Status: None   Collection Time: 10/17/19  4:45 AM   Specimen: Nasopharyngeal Swab  Result Value Ref  Range Status   SARS Coronavirus 2 NEGATIVE NEGATIVE Final    Comment: (NOTE) SARS-CoV-2 target nucleic acids are NOT DETECTED. The SARS-CoV-2 RNA is generally detectable in upper  and lower respiratory specimens during the acute phase of infection. Negative results do not preclude SARS-CoV-2 infection, do not rule out co-infections with other pathogens, and should not be used as the sole basis for treatment or other patient management decisions. Negative results must be combined with clinical observations, patient history, and epidemiological information. The expected result is Negative. Fact Sheet for Patients: SugarRoll.be Fact Sheet for Healthcare Providers: https://www.woods-mathews.com/ This test is not yet approved or cleared by the [place] FDA and  has been authorized for detection and/or diagnosis of SARS-CoV-2 by FDA under an Emergency Use Authorization (EUA). This EUA will remain  in effect (meaning this test can be used) for the duration of the COVID-19 declaration under Section 56 4(b)(1) of the Act, 21 U.S.C. section 360bbb-3(b)(1), unless the authorization is terminated or revoked sooner. Performed at [facility], [place] [address] [place], [place] [address]   Culture, respiratory     Status: None   Collection Time: 10/17/19 12:42 PM   Specimen: Tracheal Aspirate  Result Value Ref Range Status   Specimen Description   Final    TRACHEAL ASPIRATE Performed at [facility], [address] [place], [place] [address]    Special Requests   Final    NONE Performed at [facility], [place], [place] [address]    Gram Stain   Final    ABUNDANT WBC PRESENT, PREDOMINANTLY PMN ABUNDANT GRAM POSITIVE RODS FEW GRAM POSITIVE COCCI Performed at [facility], [place] [address], [place], [place] [address]    Culture   Final    FEW KLEBSIELLA OXYTOCA FEW STAPHYLOCOCCUS AUREUS    Report  Status 10/21/2019 FINAL  Final   Organism ID, Bacteria KLEBSIELLA OXYTOCA  Final   Organism ID, Bacteria STAPHYLOCOCCUS AUREUS  Final      Susceptibility   Klebsiella oxytoca - MIC*    AMPICILLIN >=32 RESISTANT Resistant     CEFAZOLIN 16 SENSITIVE Sensitive     CEFEPIME <=1 SENSITIVE Sensitive     CEFTAZIDIME <=1 SENSITIVE Sensitive     CEFTRIAXONE <=1 SENSITIVE Sensitive     CIPROFLOXACIN <=0.25 SENSITIVE Sensitive     GENTAMICIN <=1 SENSITIVE Sensitive     IMIPENEM <=0.25 SENSITIVE Sensitive     TRIMETH/SULFA <=20 SENSITIVE Sensitive     AMPICILLIN/SULBACTAM 16 INTERMEDIATE Intermediate     PIP/TAZO <=4 SENSITIVE Sensitive     Extended ESBL NEGATIVE Sensitive     * FEW KLEBSIELLA OXYTOCA   Staphylococcus aureus - MIC*    CIPROFLOXACIN <=0.5 SENSITIVE Sensitive     ERYTHROMYCIN <=0.25 SENSITIVE Sensitive     GENTAMICIN <=0.5 SENSITIVE Sensitive     OXACILLIN <=0.25 SENSITIVE Sensitive     TETRACYCLINE <=1 SENSITIVE Sensitive     VANCOMYCIN 1 SENSITIVE Sensitive     TRIMETH/SULFA <=10 SENSITIVE Sensitive     CLINDAMYCIN <=0.25 SENSITIVE Sensitive     RIFAMPIN <=0.5 SENSITIVE Sensitive     Inducible Clindamycin NEGATIVE Sensitive     * FEW STAPHYLOCOCCUS AUREUS  CULTURE, BLOOD (ROUTINE X 2) w Reflex to ID Panel     Status: None   Collection Time: 10/17/19 12:50 PM   Specimen: BLOOD  Result Value Ref Range Status   Specimen Description BLOOD PORTA CATH  Final   Special Requests   Final    BOTTLES DRAWN AEROBIC AND ANAEROBIC Blood Culture results may not be optimal due to an excessive volume of blood received in culture bottles   Culture  Final    NO GROWTH 5 DAYS Performed at [facility], [address] [place], [place] [address]    Report Status 10/22/2019 FINAL  Final  CULTURE, BLOOD (ROUTINE X 2) w Reflex to ID Panel     Status: None   Collection Time: 10/17/19  2:20 PM   Specimen: BLOOD  Result Value Ref Range Status   Specimen Description BLOOD RT HAND   Final   Special Requests   Final    BOTTLES DRAWN AEROBIC AND ANAEROBIC Blood Culture results may not be optimal due to an inadequate volume of blood received in culture bottles   Culture   Final    NO GROWTH 5 DAYS Performed at [facility], [address] [place], [place] [address]    Report Status 10/22/2019 FINAL  Final  MRSA PCR Screening     Status: None   Collection Time: 10/17/19  9:50 PM   Specimen: Nasopharyngeal  Result Value Ref Range Status   MRSA by PCR NEGATIVE NEGATIVE Final    Comment:        The GeneXpert MRSA Assay (FDA approved for NASAL specimens only), is one component of a comprehensive MRSA colonization surveillance program. It is not intended to diagnose MRSA infection nor to guide or monitor treatment for MRSA infections. Performed at [facility], [address] [place], [place] [address]   Culture, respiratory     Status: None   Collection Time: 10/18/19  2:45 PM   Specimen: Tracheal Aspirate  Result Value Ref Range Status   Specimen Description   Final    TRACHEAL ASPIRATE Performed at [facility], [address] [place], [place] [address]    Special Requests   Final    NONE Performed at [facility], [place], [place] [address]    Gram Stain   Final    ABUNDANT WBC PRESENT,BOTH PMN AND MONONUCLEAR FEW GRAM POSITIVE COCCI FEW GRAM POSITIVE RODS FEW GRAM NEGATIVE RODS Performed at [facility], [place] [address] [place],  [address]    Culture FEW KLEBSIELLA OXYTOCA  Final   Report Status 10/21/2019 FINAL  Final   Organism ID, Bacteria KLEBSIELLA OXYTOCA  Final      Susceptibility   Klebsiella oxytoca - MIC*    AMPICILLIN >=32 RESISTANT Resistant     CEFAZOLIN <=4 SENSITIVE Sensitive     CEFEPIME <=1 SENSITIVE Sensitive     CEFTAZIDIME <=1 SENSITIVE Sensitive     CEFTRIAXONE <=1 SENSITIVE Sensitive     CIPROFLOXACIN <=0.25 SENSITIVE Sensitive     GENTAMICIN <=1  SENSITIVE Sensitive     IMIPENEM <=0.25 SENSITIVE Sensitive     TRIMETH/SULFA <=20 SENSITIVE Sensitive     AMPICILLIN/SULBACTAM 16 INTERMEDIATE Intermediate     PIP/TAZO <=4 SENSITIVE Sensitive     Extended ESBL NEGATIVE Sensitive     * FEW KLEBSIELLA OXYTOCA    IMAGING RESULTS: 11/13   11/19  I have personally reviewed the films ? Impression/Recommendation ? ?63 year old female with complicated medical history.  Acute hypoxic respiratory failure on admission.  Likely due to congestive heart failure.  Also being treated as pneumonia.  Initially on vancomycin and cefepime and now on ceftriaxone and clindamycin.  MSSA and and Klebsiella oxytoca in the tracheal aspirate likely  colonizing the tracheostomy.  As she is been adequately treated for nearly 7 days of antibiotics a chest x-ray is improved especially the basilar atelectasis. The procalcitonin level is not significant, so I  would recommend discontinuing antibiotics.  If shortness of breath is persisting it could very well be her underlying CHF.  Coronary artery disease with MI leading to papillary muscle rupture in 2017 needing mitral valve replacement.  Tracheostomy following a prolonged ventilation in 2017 followed by complication with subglottic stenosis and now has a Shiley and followed at [facility]..  End-stage renal disease.  On dialysis now. She is on the transplant list at [facility].  History of positive PPD/QuantiFERON gold while work-up for transplant.  She will follow-up at [facility] for the same.   ___________________________________________________ Discussed with patient, her son and the hospitalist ID will sign off.  Call if needed. Note:  This document was prepared using Dragon voice recognition software and may include unintentional dictation errors.

## 2019-10-23 NOTE — TOC Progression Note (Signed)
Transition of Care [facility]) - Progression Note    Patient Details  Name: Deborah [name] MRN: [id number] Date of Birth: Jul 06, 1956  Transition of Care [facility]) CM/SW [place], LCSW Phone Number: 10/23/2019, 12:38 PM  Clinical Narrative: Patient has decided she does not want home health therapy at discharge. She just wants to continue with her PCS aide three times a week.  Expected Discharge Plan: Home/Self Care Barriers to Discharge: Continued Medical Work up  Expected Discharge Plan and Services Expected Discharge Plan: Home/Self Care   Discharge Planning Services: CM Consult                                           Social Determinants of Health (SDOH) Interventions    Readmission Risk Interventions Readmission Risk Prevention Plan 10/18/2019  Transportation Screening Complete  PCP or Specialist appointment within 3-5 days of discharge [place] Not Applicable  Some recent data might be hidden

## 2019-10-23 NOTE — Progress Notes (Signed)
PROGRESS NOTE  Deborah [name] [id number] DOB: 1956-07-08 DOA: 10/17/2019 PCP: System, Pcp Not In  Brief History   [name] a76 y.o.African-American femalewith a known history of type II arrhythmias, hypertension, dyslipidemia and end-stage renal disease on hemodialysis on Monday Wednesday Friday, who presented to the emergency room with acute onset of worsening dyspnea and hypoxia with a pulse ox entry of 81% on room air. Patient is status post tracheostomy but is not on home O2. She has been having dry cough here occasionally productive of clear sputum at home. She admitted to orthopnea and paroxysmal nocturnal dyspnea. She had no fever or chills.No worsening lower extremity edema. No headache or dizziness or blurred vision. No nausea vomiting or abdominal pain. She did not miss any hemodialysis session.  Upon presentation to the emergency room, blood pressure was 184/95and later 200/89with pulse currently of 96% on her percent nonrebreather and a temperature of 99.2. Labs revealed mild cytosis with WBC of 12.7 and neutrophilia. ANC/ALC ratio was 9.7/1.4. BNP was 1350 and high-sensitivity troponin I of 37. CMP showed BUN of 49 creatinine 6.47. Blood cultures were ordered.  The patient was given IV cefepime and vancomycin as well as IV nitroglycerin drip. She will be admitted to a telemetry bed for further evaluation and management.  Patient was seen and examined at her bedside in the ED. Reports dyspnea with minimal exertion worsened with ambulation. Also reports pleuritic pain with nonproductive cough.  COVID-19 test in process.Plan for HD today. Last HD was on Wednesday, 10/15/2019. Independently viewed chest x-ray done on admission which showed cardiomegaly with increase in pulmonary vascularity and bilateral pleural effusions. BNP elevated greater than 1300. Last 2D echo done in2018 showed normal LVEF 55 to 60%. A repeat TTEis pending.  Currently on  Rocephin and azithromycin for Klebsiella oxytoca pneumonia. Resistant to ampicillin and unasyn.   Consultants  . Nephrology . Cardiology  Procedures  . None  Antibiotics   Anti-infectives (From admission, onward)   Start     Dose/Rate Route Frequency Ordered Stop   10/22/19 1900  cefTRIAXone (ROCEPHIN) 2 g in sodium chloride 0.9 % 100 mL IVPB  Status:  Discontinued     2 g 200 mL/hr over 30 Minutes Intravenous Every 24 hours 10/22/19 1743 10/23/19 1614   10/22/19 1900  clindamycin (CLEOCIN) IVPB 900 mg  Status:  Discontinued     900 mg 100 mL/hr over 30 Minutes Intravenous Every 8 hours 10/22/19 1743 10/23/19 1614   10/17/19 0830  cefTRIAXone (ROCEPHIN) 2 g in sodium chloride 0.9 % 100 mL IVPB     2 g 200 mL/hr over 30 Minutes Intravenous Every 24 hours 10/17/19 0610 10/21/19 1122   10/17/19 0745  vancomycin (VANCOCIN) 500 mg in sodium chloride 0.9 % 100 mL IVPB     500 mg 100 mL/hr over 60 Minutes Intravenous  Once 10/17/19 0554 10/17/19 0916   10/17/19 0615  azithromycin (ZITHROMAX) 500 mg in sodium chloride 0.9 % 250 mL IVPB     500 mg 250 mL/hr over 60 Minutes Intravenous Every 24 hours 10/17/19 0610 10/21/19 0749   10/17/19 0545  vancomycin (VANCOCIN) IVPB 1000 mg/200 mL premix     1,000 mg 200 mL/hr over 60 Minutes Intravenous  Once 10/17/19 0539 10/17/19 0746   10/17/19 0545  ceFEPIme (MAXIPIME) 2 g in sodium chloride 0.9 % 100 mL IVPB     2 g 200 mL/hr over 30 Minutes Intravenous  Once 10/17/19 0539 10/17/19 0622    .  Marland Kitchen  Subjective  The patient is resting comfortably. No new complaints.  Objective   Vitals:  Vitals:   10/23/19 0807 10/23/19 1553  BP: (!) 148/65 (!) 117/59  Pulse: (!) 59 66  Resp: 18 18  Temp: 98.3 F (36.8 C) 98.2 F (36.8 C)  SpO2: 100% 100%   Exam:  Constitutional:  . The patient is awake, alert, and oriented x 3. No acute distress. Respiratory:  . No increased work of breathing. . No wheezes, rales, or rhonchi . No tactile  fremitus Cardiovascular:  . Regular rate and rhythm . No murmurs, ectopy, or gallups. . No lateral PMI. No thrills. Abdomen:  . Abdomen is soft, non-tender, non-distended . No hernias, masses, or organomegaly . Normoactive bowel sounds.  Musculoskeletal:  . No cyanosis, clubbing, or edema Skin:  . No rashes, lesions, ulcers . palpation of skin: no induration or nodules Neurologic:  . CN 2-12 intact . Sensation all 4 extremities intact Psychiatric:  . Mental status o Mood, affect appropriate o Orientation to person, place, time  . judgment and insight appear intact   I have personally reviewed the following:   Today's Data  . Vitals, BMP, CBC  Lab Data    Micro Data  . Sputum positive for klebsiella oxytoca and MSSA  Scheduled Meds: . amiodarone  200 mg Oral Daily  . apixaban  2.5 mg Oral BID  . aspirin EC  81 mg Oral Daily  . atorvastatin  80 mg Oral q1800  . carvedilol  25 mg Oral BID  . Chlorhexidine Gluconate Cloth  6 each Topical Q0600  . epoetin (EPOGEN/PROCRIT) injection  10,000 Units Intravenous Q M,W,F-HD  . feeding supplement (NEPRO CARB STEADY)  237 mL Oral BID BM  . furosemide  40 mg Intravenous Q12H  . gabapentin  100 mg Oral BID  . guaiFENesin  600 mg Oral BID  . insulin aspart  0-9 Units Subcutaneous TID WC  . insulin glargine  14 Units Subcutaneous QHS  . ipratropium-albuterol  3 mL Nebulization TID  . Melatonin  5 mg Oral QHS  . multivitamin  1 tablet Oral QHS  . pantoprazole  40 mg Oral BID  . polyethylene glycol  17 g Oral Daily  . senna-docusate  2 tablet Oral QHS  . sevelamer carbonate  1,600 mg Oral TID WC  . sodium chloride flush  3 mL Intravenous Q12H   Continuous Infusions: . sodium chloride 250 mL (10/22/19 2004)    Active Problems:   Acute CHF (congestive heart failure) ([name])   LOS: 6 days   A & P   Acute on chronic diastolic CHFwith subsequent acute hypoxic respiratory failure. Cardiology was consulted. Echocardiogram  performed on 55-60% Normal Righ systolic function, bioprosthetic mitral valve in correct position. Indeterminate findings for diastolic dysfunction. Volume has been reduced by HD . I appreciate the assistance of cardiology and nephrology.   Resolving acute hypoxic respiratory failure likely 2/2 to pulmonary edema suspect infective in the setting of PNA: The patient continues to have high oxygen requirements. Maintain O2 sat >92%. Wean as possible.   Klebsiella oxytolca community-acquired bacterialpneumonia: Sputum culture positive for Klebsiella that is resistant to ampicillin and unasyn. Continue Rocephin and azithromycin empirically continue the sensitivities return. Wbc trending down.Continue to monitor fever curve and WBC. Trach suctioning by RT.  End-stage renal disease on hemodialysis: HD on Monday Wednesday Friday. We will continue Renvela, calcitriol and Renagel as well as renal multivitamins. I appreciate nephrology's assistance. .  Hypertensive emergency with flash pulmonary  edema: Blood pressures under fair control with carvedilol.   Dyslipidemia: Continue statin therapy   Type 2 diabetes mellitus with hyperglycemia: Continue lantus 14 units subcutaneous daily with correction insulin. The patient is on a hypoglycemic protocol. Hemoglobin A1c 7.4 on 10/17/2019.  GERD: Continue Protonix.  Paroxysmal atrial fib: The patient is on eliquis. Continue amiodarone for rhythm control.  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 As per cardiology.  I have seen and examined this patient myself. I have spent 32 minutes in her evaluation and care.  Steel Kerney, DO [facility] Direct contact: see [email or web address]  7PM-7AM contact night coverage as above 10/22/2019, 5:26 PM  LOS: 6 days

## 2019-10-23 NOTE — Progress Notes (Signed)
PROGRESS NOTE  Deborah [name] [id number] DOB: 1956-02-17 DOA: 10/17/2019 PCP: System, Pcp Not In  Brief History   Deborah [name] a78 y.o.African-American femalewith a known history of type II arrhythmias, hypertension, dyslipidemia and end-stage renal disease on hemodialysis on Monday Wednesday Friday, who presented to the emergency room with acute onset of worsening dyspnea and hypoxia with a pulse ox entry of 81% on room air. Patient is status post tracheostomy but is not on home O2. She has been having dry cough here occasionally productive of clear sputum at home. She admitted to orthopnea and paroxysmal nocturnal dyspnea. She had no fever or chills.No worsening lower extremity edema. No headache or dizziness or blurred vision. No nausea vomiting or abdominal pain. She did not miss any hemodialysis session.  Upon presentation to the emergency room, blood pressure was 184/95and later 200/89with pulse currently of 96% on her percent nonrebreather and a temperature of 99.2. Labs revealed mild cytosis with WBC of 12.7 and neutrophilia. ANC/ALC ratio was 9.7/1.4. BNP was 1350 and high-sensitivity troponin I of 37. CMP showed BUN of 49 creatinine 6.47. Blood cultures were ordered.  The patient was given IV cefepime and vancomycin as well as IV nitroglycerin drip. She will be admitted to a telemetry bed for further evaluation and management.  Patient was seen and examined at her bedside in the ED. Reports dyspnea with minimal exertion worsened with ambulation. Also reports pleuritic pain with nonproductive cough.  COVID-19 test in process.Plan for HD today. Last HD was on Wednesday, 10/15/2019. Independently viewed chest x-ray done on admission which showed cardiomegaly with increase in pulmonary vascularity and bilateral pleural effusions. BNP elevated greater than 1300. Last 2D echo done in2018 showed normal LVEF 55 to 60%. A repeat TTEis pending.  Currently on  Rocephin and azithromycin for Klebsiella oxytoca pneumonia. Resistant to ampicillin and unasyn. Infectious disease has been consulted.  Consultants  . Nephrology . Cardiology . Infectious Disease  Procedures  . Hemodialysis  Antibiotics   Anti-infectives (From admission, onward)   Start     Dose/Rate Route Frequency Ordered Stop   10/22/19 1900  cefTRIAXone (ROCEPHIN) 2 g in sodium chloride 0.9 % 100 mL IVPB  Status:  Discontinued     2 g 200 mL/hr over 30 Minutes Intravenous Every 24 hours 10/22/19 1743 10/23/19 1614   10/22/19 1900  clindamycin (CLEOCIN) IVPB 900 mg  Status:  Discontinued     900 mg 100 mL/hr over 30 Minutes Intravenous Every 8 hours 10/22/19 1743 10/23/19 1614   10/17/19 0830  cefTRIAXone (ROCEPHIN) 2 g in sodium chloride 0.9 % 100 mL IVPB     2 g 200 mL/hr over 30 Minutes Intravenous Every 24 hours 10/17/19 0610 10/21/19 1122   10/17/19 0745  vancomycin (VANCOCIN) 500 mg in sodium chloride 0.9 % 100 mL IVPB     500 mg 100 mL/hr over 60 Minutes Intravenous  Once 10/17/19 0554 10/17/19 0916   10/17/19 0615  azithromycin (ZITHROMAX) 500 mg in sodium chloride 0.9 % 250 mL IVPB     500 mg 250 mL/hr over 60 Minutes Intravenous Every 24 hours 10/17/19 0610 10/21/19 0749   10/17/19 0545  vancomycin (VANCOCIN) IVPB 1000 mg/200 mL premix     1,000 mg 200 mL/hr over 60 Minutes Intravenous  Once 10/17/19 0539 10/17/19 0746   10/17/19 0545  ceFEPIme (MAXIPIME) 2 g in sodium chloride 0.9 % 100 mL IVPB     2 g 200 mL/hr over 30 Minutes Intravenous  Once 10/17/19 0539 10/17/19 0622     .  Subjective  The patient is resting comfortably. No new complaints.  Objective   Vitals:  Vitals:   10/23/19 0807 10/23/19 1553  BP: (!) 148/65 (!) 117/59  Pulse: (!) 59 66  Resp: 18 18  Temp: 98.3 F (36.8 C) 98.2 F (36.8 C)  SpO2: 100% 100%   Exam:  Constitutional:  . The patient is awake, alert, and oriented x 3. No acute distress. Respiratory:  . No increased  work of breathing. . No wheezes, rales, or rhonchi . No tactile fremitus Cardiovascular:  . Regular rate and rhythm . No murmurs, ectopy, or gallups. . No lateral PMI. No thrills. Abdomen:  . Abdomen is soft, non-tender, non-distended . No hernias, masses, or organomegaly . Normoactive bowel sounds.  Musculoskeletal:  . No cyanosis, clubbing, or edema Skin:  . No rashes, lesions, ulcers . palpation of skin: no induration or nodules Neurologic:  . CN 2-12 intact . Sensation all 4 extremities intact Psychiatric:  . Mental status o Mood, affect appropriate o Orientation to person, place, time  . judgment and insight appear intact   I have personally reviewed the following:   Today's Data  . Vitals, BMP, CBC  Lab Data    Micro Data  . Sputum positive for klebsiella oxytoca  Scheduled Meds: . amiodarone  200 mg Oral Daily  . apixaban  2.5 mg Oral BID  . aspirin EC  81 mg Oral Daily  . atorvastatin  80 mg Oral q1800  . carvedilol  25 mg Oral BID  . Chlorhexidine Gluconate Cloth  6 each Topical Q0600  . epoetin (EPOGEN/PROCRIT) injection  10,000 Units Intravenous Q M,W,F-HD  . feeding supplement (NEPRO CARB STEADY)  237 mL Oral BID BM  . furosemide  40 mg Intravenous Q12H  . gabapentin  100 mg Oral BID  . guaiFENesin  600 mg Oral BID  . insulin aspart  0-9 Units Subcutaneous TID WC  . insulin glargine  14 Units Subcutaneous QHS  . ipratropium-albuterol  3 mL Nebulization TID  . Melatonin  5 mg Oral QHS  . multivitamin  1 tablet Oral QHS  . pantoprazole  40 mg Oral BID  . polyethylene glycol  17 g Oral Daily  . senna-docusate  2 tablet Oral QHS  . sevelamer carbonate  1,600 mg Oral TID WC  . sodium chloride flush  3 mL Intravenous Q12H   Continuous Infusions: . sodium chloride 250 mL (10/22/19 2004)    Active Problems:   Acute CHF (congestive heart failure) ([name])   LOS: 6 days   A & P   Acute on chronic diastolic CHFwith subsequent acute hypoxic  respiratory failure. Cardiology was consulted. Echocardiogram performed on 55-60% Normal Righ systolic function, bioprosthetic mitral valve in correct position. Indeterminate findings for diastolic dysfunction. Volume has been reduced by HD . I appreciate the assistance of cardiology and nephrology.   Resolving acute hypoxic respiratory failure likely 2/2 to pulmonary edema suspect infective in the setting of PNA: The patient continues to have high oxygen requirements. Maintain O2 sat >92%. Wean as possible.   Klebsiella oxytolca community-acquired bacterialpneumonia: Sputum culture positive for Klebsiella that is resistant to ampicillin and unasyn. Continue Rocephin and azithromycin empirically continue the sensitivities return. Wbc trending down.Continue to monitor fever curve and WBC. Trach suctioning by RT.I have consulted infectious disease to help determine duration of therapy and possible conversion to oral antibiotics. I appreciate their assistance.  End-stage renal disease on hemodialysis: HD on Monday Wednesday Friday. We will continue Renvela, calcitriol  and Renagel as well as renal multivitamins. I appreciate nephrology's assistance. .  Hypertensive emergency with flash pulmonary edema: Blood pressures under fair control with carvedilol.   Dyslipidemia: Continue statin therapy   Type 2 diabetes mellitus with hyperglycemia: Continue lantus 14 units subcutaneous daily with correction insulin. The patient is on a hypoglycemic protocol. Hemoglobin A1c 7.4 on 10/17/2019.  GERD: Continue Protonix.  Paroxysmal atrial fib: The patient is on eliquis. Continue amiodarone for rhythm control.  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 As per cardiology.  I have seen and examined this patient myself. I have spent 38 minutes in her evaluation and care.  Johnmichael Melhorn, DO [facility] Direct contact: see [email or web address]  7PM-7AM contact night coverage as  above 10/23/2019, 11:15 AM  LOS: 6 days

## 2019-10-24 DIAGNOSIS — I161 Hypertensive emergency: Secondary | ICD-10-CM

## 2019-10-24 DIAGNOSIS — E118 Type 2 diabetes mellitus with unspecified complications: Secondary | ICD-10-CM

## 2019-10-24 DIAGNOSIS — I5031 Acute diastolic (congestive) heart failure: Secondary | ICD-10-CM

## 2019-10-24 LAB — GLUCOSE, CAPILLARY
Glucose-Capillary: 188 mg/dL — ABNORMAL HIGH (ref 70–99)
Glucose-Capillary: 97 mg/dL (ref 70–99)

## 2019-10-24 MED ORDER — ASPIRIN 81 MG PO TBEC: 81.0000 mg | DELAYED_RELEASE_TABLET | Freq: Every day | ORAL | 0 refills | Status: AC

## 2019-10-24 MED ORDER — ASPIRIN 81 MG PO TBEC: 81.0000 mg | DELAYED_RELEASE_TABLET | Freq: Every day | ORAL | 0 refills | Status: DC

## 2019-10-24 MED ORDER — SENNOSIDES-DOCUSATE SODIUM 8.6-50 MG PO TABS: 2.0000 | ORAL_TABLET | Freq: Every day | ORAL | 0 refills | Status: DC

## 2019-10-24 MED ORDER — ALPRAZOLAM 0.25 MG PO TABS: 0.2500 mg | ORAL_TABLET | Freq: Two times a day (BID) | ORAL | 0 refills | Status: DC | PRN

## 2019-10-24 MED ORDER — AMIODARONE HCL 200 MG PO TABS: 200.0000 mg | ORAL_TABLET | Freq: Every day | ORAL | 0 refills | Status: DC

## 2019-10-24 NOTE — Progress Notes (Signed)
Post HD Assessment   10/24/19 1245  Neurological  Level of Consciousness Alert  Orientation Level Oriented to person;Oriented to place  Respiratory  Respiratory Pattern Irregular  Bilateral Breath Sounds Clear  Cardiac  Pulse Regular  Heart Sounds S1, S2  Vascular  R Radial Pulse +2  L Radial Pulse +2  Psychosocial  Psychosocial (WDL) WDL

## 2019-10-24 NOTE — Progress Notes (Signed)
Pre HD Assessment   10/24/19 0851  Neurological  Level of Consciousness Alert  Orientation Level Oriented to person;Oriented to place  Respiratory  Respiratory Pattern Irregular  Bilateral Breath Sounds Rhonchi  Cardiac  Pulse Regular  Heart Sounds S1, S2  Vascular  R Radial Pulse +2  L Radial Pulse +2  Psychosocial  Psychosocial (WDL) WDL

## 2019-10-24 NOTE — Progress Notes (Signed)
HD Completed   10/24/19 1235  Vital Signs  Temp 98.3 F (36.8 C)  Temp Source Oral  Pulse Rate 68  Pulse Rate Source Dinamap  Resp 13  BP (!) 178/72  BP Location Right Arm  BP Method Automatic  Patient Position (if appropriate) Sitting  Oxygen Therapy  SpO2 100 %  O2 Device Tracheostomy Collar  Pain Assessment  Pain Scale 0-10  Pain Score 0  During Hemodialysis Assessment  KECN 79.6 KECN  Dialysis Fluid Bolus Normal Saline  Bolus Amount (mL) 250 mL  Intra-Hemodialysis Comments Tx completed;Tolerated well

## 2019-10-24 NOTE — TOC Transition Note (Signed)
Transition of Care [facility]) - CM/SW Discharge Note   Patient Details  Name: Deborah [name] MRN: [id number] Date of Birth: 05/01/1956  Transition of Care [facility]) CM/SW Contact:  Candie Chroman, LCSW Phone Number: 10/24/2019, 3:30 PM   Clinical Narrative:  Patient has orders to discharge home today. No further concerns. CSW signing off.   Final next level of care: Home/Self Care Barriers to Discharge: Barriers Resolved   Patient Goals and CMS Choice        Discharge Placement                Patient to be transferred to facility by: Son will take her home.   Patient and family notified of of transfer: 10/24/19  Discharge Plan and Services   Discharge Planning Services: CM Consult                                 Social Determinants of Health ([place]) Interventions     Readmission Risk Interventions Readmission Risk Prevention Plan 10/18/2019  Transportation Screening Complete  PCP or Specialist appointment within 3-5 days of discharge [place] Not Applicable  Some recent data might be hidden

## 2019-10-24 NOTE — Progress Notes (Signed)
Harrison Mons to be D/C'd Home per MD order.  Discussed prescriptions and follow up appointments with the patient. Prescriptions given to patient, medication list explained in detail. Pt verbalized understanding.Son at bedside  Allergies as of 10/24/2019   No Known Allergies     Medication List    STOP taking these medications   amLODipine 5 MG tablet Commonly known as: NORVASC   clopidogrel 75 MG tablet Commonly known as: PLAVIX   Levemir FlexTouch 100 UNIT/ML Pen Generic drug: Insulin Detemir   olmesartan 20 MG tablet Commonly known as: BENICAR   Renagel 800 MG tablet Generic drug: sevelamer     TAKE these medications   acetaminophen 325 MG tablet Commonly known as: TYLENOL Take 650 mg by mouth.   albuterol (2.5 MG/3ML) 0.083% nebulizer solution Commonly known as: PROVENTIL Take 3 mLs (2.5 mg total) by nebulization 4 (four) times daily - after meals and at bedtime.   ALPRAZolam 0.25 MG tablet Commonly known as: XANAX Take 1 tablet (0.25 mg total) by mouth 2 (two) times daily as needed for anxiety.   amiodarone 200 MG tablet Commonly known as: PACERONE Take 1 tablet (200 mg total) by mouth daily.   apixaban 2.5 MG Tabs tablet Commonly known as: ELIQUIS Take 2.5 mg by mouth 2 (two) times daily.   aspirin 81 MG EC tablet Take 1 tablet (81 mg total) by mouth daily.   atorvastatin 80 MG tablet Commonly known as: LIPITOR Take 80 mg by mouth daily at 6 PM.   carvedilol 25 MG tablet Commonly known as: COREG Take 25 mg by mouth 2 (two) times daily.   furosemide 40 MG tablet Commonly known as: LASIX Take 40 mg by mouth daily. ON mornings of dialysis   gabapentin 300 MG capsule Commonly known as: NEURONTIN TAKE 1 CAPSULE BY MOUTH THREE TIMES DAILY   GlucoCom Blood Glucose Monitor Devi Use to check blood sugars 4-6 times per day Disp per insurance company kit ICD-10 E11.9   insulin glargine 100 UNIT/ML injection Commonly known as: LANTUS Inject 14 Units  into the skin at bedtime.   lidocaine-prilocaine cream Commonly known as: EMLA Apply 1 application topically every dialysis.   Melatonin 3 MG Tabs Take 3-6 mg by mouth at bedtime.   midodrine 5 MG tablet Commonly known as: PROAMATINE Take 5 mg by mouth 3 (three) times a week.   NONFORMULARY OR COMPOUNDED ITEM See pharmacy note   pantoprazole 40 MG tablet Commonly known as: PROTONIX Take 1 tablet (40 mg total) by mouth 2 (two) times daily.   polyethylene glycol 17 g packet Commonly known as: MIRALAX / GLYCOLAX Take 17 g by mouth daily.   QUEtiapine 50 MG tablet Commonly known as: SEROQUEL Take 25 mg by mouth 3 (three) times daily as needed.   Rena-Vite Rx 1 MG Tabs Take 1 tablet by mouth daily.   senna-docusate 8.6-50 MG tablet Commonly known as: Senokot-S Take 2 tablets by mouth at bedtime.   sevelamer carbonate 800 MG tablet Commonly known as: RENVELA Take 1,600 mg by mouth 3 (three) times daily with meals.   traZODone 50 MG tablet Commonly known as: DESYREL Take 50 mg by mouth at bedtime as needed for sleep.            Durable Medical Equipment  (From admission, onward)         Start     Ordered   10/24/19 0949  DME Oxygen  Once    Question Answer Comment  Length of  Need Lifetime   Frequency Continuous (stationary and portable oxygen unit needed)   Oxygen conserving device Yes   Oxygen delivery system Gas      10/24/19 0949          Vitals:   10/24/19 1403 10/24/19 1424  BP:    Pulse: 79 90  Resp:    Temp:    SpO2: 98% 96%    Skin clean, dry and intact without evidence of skin break down, no evidence of skin tears noted. IV catheter discontinued intact. Site without signs and symptoms of complications. Dressing and pressure applied. Pt denies pain at this time. No complaints noted.  An After Visit Summary was printed and given to the patient. Patient escorted via [place], and D/C home via private auto.  Rolley Sims

## 2019-10-24 NOTE — Progress Notes (Signed)
Post HD   10/24/19 1243  Vital Signs  Temp 98.3 F (36.8 C)  Temp Source Oral  Pulse Rate 73  Pulse Rate Source Dinamap  Resp 19  BP (!) 162/67  BP Location Right Arm  BP Method Automatic  Patient Position (if appropriate) Sitting  Oxygen Therapy  SpO2 100 %  O2 Device Tracheostomy Collar  Pain Assessment  Pain Scale 0-10  Pain Score 0  Post-Hemodialysis Assessment  Rinseback Volume (mL) 250 mL  KECN 79.6 V  Dialyzer Clearance Lightly streaked  Duration of HD Treatment -hour(s) 3.5 hour(s)  Hemodialysis Intake (mL) 500 mL  UF Total -Machine (mL) 2000 mL  Net UF (mL) 1500 mL  Tolerated HD Treatment Yes  AVG/AVF Arterial Site Held (minutes)  (5)  AVG/AVF Venous Site Held (minutes)  (5)  Fistula / Graft Left Upper arm  Placement Date: 10/02/16   Placed prior to admission: No  Orientation: Left  Access Location: Upper arm  Site Condition No complications  Fistula / Graft Assessment Bruit;Thrill;Present  Status Deaccessed  Drainage Description None

## 2019-10-24 NOTE — TOC Progression Note (Addendum)
Transition of Care [facility]) - Progression Note    Patient Details  Name: Deborah VASSELL MRN: [id number] Date of Birth: [birth year]  Transition [facility]) CM/SW [place], LCSW Phone Number: 10/24/2019, 12:37 PM  Clinical Narrative: Patient has orders to discharge home today. Notified [employer] representative know about home oxygen order. Will need qualifying sats. RN is aware and will reach out to respiratory.     2:35 pm: RN said she was able to wean patient to room air. She will notify the MD.  Expected Discharge Plan: Home/Self Care Barriers to Discharge: Continued Medical Work up  Expected Discharge Plan and Services Expected Discharge Plan: Home/Self Care   Discharge Planning Services: CM Consult     Expected Discharge Date: 10/24/19                                     Social Determinants of Health (SDOH) Interventions    Readmission Risk Interventions Readmission Risk Prevention Plan 10/18/2019  Transportation Screening Complete  PCP or Specialist appointment within 3-5 days of discharge [place] Not Applicable  Some recent data might be hidden

## 2019-10-24 NOTE — Progress Notes (Signed)
HD Initiated   10/24/19 0900  Vital Signs  Temp 98.6 F (37 C)  Temp Source Oral  Pulse Rate 65  Pulse Rate Source Dinamap  Resp 14  BP (!) 161/76  BP Location Right Arm  BP Method Automatic  Patient Position (if appropriate) Sitting  Oxygen Therapy  SpO2 100 %  O2 Device Tracheostomy Collar  Pain Assessment  Pain Scale 0-10  Pain Score 0  During Hemodialysis Assessment  Blood Flow Rate (mL/min) 400 mL/min  Arterial Pressure (mmHg) -110 mmHg  Venous Pressure (mmHg) 190 mmHg  Transmembrane Pressure (mmHg) 60 mmHg  Ultrafiltration Rate (mL/min) 570 mL/min  Dialysate Flow Rate (mL/min) 600 ml/min  Conductivity: Machine  13.8  HD Safety Checks Performed Yes  Dialysis Fluid Bolus Normal Saline  Bolus Amount (mL) 250 mL  Intra-Hemodialysis Comments Tx initiated

## 2019-10-24 NOTE — Progress Notes (Addendum)
Pre HD    10/24/19 0850  Vital Signs  Temp 98.6 F (37 C)  Temp Source Oral  Pulse Rate 65  Pulse Rate Source Dinamap  Resp 14  BP (!) 168/71  BP Location Right Arm  BP Method Automatic  Patient Position (if appropriate) Sitting  Oxygen Therapy  SpO2 98 %  O2 Device Tracheostomy Collar  Pain Assessment  Pain Scale 0-10  Pain Score 0  Dialysis Weight  Weight  (unable to weigh pt)  Type of Weight Pre-Dialysis  Time-Out for Hemodialysis  What Procedure? HD  Pt Identifiers(min of two) First/Last Name;MRN/Account#;Pt's DOB(use if MRN/Acct# not available  Correct Site? Yes  Correct Side? Yes  Correct Procedure? Yes  Consents Verified? Yes  Rad Studies Available? N/A  Safety Precautions Reviewed? Yes  Engineer, civil (consulting) Number 3  Station Number 4  UF/Alarm Test Passed  Conductivity: Meter 13.8  Conductivity: Machine  13.8  pH 7  Reverse Osmosis main  Normal Saline Lot Number [id number]  Dialyzer Lot Number [id number]  Disposable Set Lot Number [id number]  Dialysate Acid Bath Lot Number [id number]  Dialysate HCO3 Bath Lot Number [id number]  Machine Temperature 98.6 F (37 C)  Musician and Audible Yes  Blood Lines Intact and Secured Yes  Pre Treatment Patient Checks  Vascular access used during treatment Fistula  Hepatitis B Surface Antigen Results Negative  Date Hepatitis B Surface Antigen Drawn 10/08/19  Hemodialysis Consent Verified Yes  Hemodialysis Standing Orders Initiated Yes  ECG (Telemetry) Monitor On Yes  Prime Ordered Normal Saline  Length of  DialysisTreatment -hour(s) 3.5 Hour(s)  Dialysis Treatment Comments  (Na 140)  Dialyzer Elisio 17H NR  Dialysate 3K;2.5 Ca  Dialysate Flow Ordered 600  Blood Flow Rate Ordered 400 mL/min  Ultrafiltration Goal 1.5 Liters  Dialysis Blood Pressure Support Ordered Normal Saline  Education / Care Plan  Dialysis Education Provided Yes  Documented Education in Care Plan Yes  Outpatient Plan of Care Reviewed and  on Chart Yes  Fistula / Graft Left Upper arm  Placement Date: 10/02/16   Placed prior to admission: No  Orientation: Left  Access Location: Upper arm  Site Condition No complications  Fistula / Graft Assessment Bruit;Thrill;Present  Status Accessed  Needle Size 15  Drainage Description None

## 2019-10-24 NOTE — Progress Notes (Signed)
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 She was noted to have hypoxia with a pulse oximetry of 81% on room air .CXR revealed cardiomegaly with pulmonary vascular congestion.  Subjective   Denies excessive shortness of breath today.  Still on fairly high flow oxygen via trach.  Inpatient Medications    . amiodarone  200 mg Oral Daily  . apixaban  2.5 mg Oral BID  . aspirin EC  81 mg Oral Daily  . atorvastatin  80 mg Oral q1800  . carvedilol  25 mg Oral BID  . Chlorhexidine Gluconate Cloth  6 each Topical Q0600  . epoetin (EPOGEN/PROCRIT) injection  10,000 Units Intravenous Q M,W,F-HD  . feeding supplement (NEPRO CARB STEADY)  237 mL Oral BID BM  . furosemide  40 mg Intravenous Q12H  . gabapentin  100 mg Oral BID  . guaiFENesin  600 mg Oral BID  . insulin aspart  0-9 Units Subcutaneous TID WC  . insulin glargine  14 Units Subcutaneous QHS  . ipratropium-albuterol  3 mL Nebulization TID  . Melatonin  5 mg Oral QHS  . multivitamin  1 tablet Oral QHS  . pantoprazole  40 mg Oral BID  . polyethylene glycol  17 g Oral Daily  . senna-docusate  2 tablet Oral QHS  . sevelamer carbonate  1,600 mg Oral TID WC  . sodium chloride flush  3 mL Intravenous Q12H    Vital Signs    Vitals:   10/23/19 1937 10/23/19 2037 10/24/19 0350 10/24/19 0745  BP: (!) 145/69  (!) 152/74 (!) 158/70  Pulse: 71  65 66  Resp: 18  19 18   Temp: 98.5 F (36.9 C)  98.3 F (36.8 C) 98.4 F (36.9 C)  TempSrc:  Oral   Oral  SpO2: 100% 99% 99% 100%  Weight:   64.5 kg   Height:        Intake/Output Summary (Last 24 hours) at 10/24/2019 0836 Last data filed at 10/24/2019 0439 Gross per 24 hour  Intake 480 ml  Output 0 ml  Net 480 ml   Filed Weights   10/22/19 1345 10/23/19 0342 10/24/19 0350  Weight: 63.9 kg 63.2 kg 64.5 kg    Physical Exam    GEN: Well nourished, well developed, in no acute distress.  HEENT: normal.  Neck: Supple, no JVD, carotid bruits, or masses. Cardiac: RRR, no murmurs, rubs, or gallops. No clubbing, cyanosis, edema.  Radials/DP/PT 2+ and equal bilaterally.  Respiratory: Scattered rhonchi bilaterally. GI: Soft, nontender, nondistended, BS + x 4. MS: no deformity or atrophy. Skin: warm and dry, no rash. Neuro:  Strength and sensation are intact. Psych: Normal affect.  Labs    CBC Recent Labs    10/22/19 0429 10/23/19 0639  WBC 11.4* 10.6*  HGB 8.4* 8.4*  HCT 25.0* 25.5*  MCV 87.1 91.1  PLT 279 [id number]   Basic Metabolic Panel Recent Labs    10/22/19 0429 10/23/19 0639  NA 137 136  K 4.4 4.3  CL 96* 96*  CO2 25 25  GLUCOSE 230*  151*  BUN 61* 36*  CREATININE 8.01* 5.51*  CALCIUM 8.9 8.7*   Liver Function Tests No results for input(s): AST, ALT, ALKPHOS, BILITOT, PROT, ALBUMIN in the last 72 hours. No results for input(s): LIPASE, AMYLASE in the last 72 hours. Cardiac Enzymes No results for input(s): CKTOTAL, CKMB, CKMBINDEX, TROPONINI in the last 72 hours. BNP No results for input(s): BNP in the last 72 hours. D-Dimer No results for input(s): DDIMER in the last 72 hours. Hemoglobin A1C No results for input(s): HGBA1C in the last 72 hours. Fasting Lipid Panel No results for input(s): CHOL, HDL, LDLCALC, TRIG, CHOLHDL, LDLDIRECT in the last 72 hours. Thyroid Function Tests No results for input(s): TSH, T4TOTAL, T3FREE, THYROIDAB in the last 72 hours.  Invalid input(s): FREET3  Telemetry    Normal sinus rhythm  ECG      Radiology     Dg Chest Port 1 View  Result Date: 10/23/2019 CLINICAL DATA:  Short of breath. Hypoxia. EXAM: PORTABLE CHEST 1 VIEW COMPARISON:  Radiograph 10/19/2019 FINDINGS: Tracheostomy tube unchanged in position. Sternotomy wires overlies stable cardiac silhouette. There is improvement in LEFT basilar atelectasis. Decrease in small effusions. Mild bibasilar atelectasis remains. No edema infiltrate or pneumothorax. IMPRESSION: 1. Improvement in LEFT basilar atelectasis and decreased effusions. 2. Persistent mild bibasilar atelectasis. Electronically Signed   By: Suzy Bouchard M.D.   On: 10/23/2019 09:56   Dg Chest Port 1 View  Result Date: 10/19/2019 CLINICAL DATA:  Community acquired pneumonia EXAM: PORTABLE CHEST 1 VIEW COMPARISON:  10/17/2019 FINDINGS: Tip of tracheostomy tube is above the carina. Previous median sternotomy and mitral valvuloplasty. Mild cardiac enlargement. Bilateral pleural effusions and increased interstitial edema is noted. Atelectasis and/or airspace disease noted in the left lower lobe and right lung base. IMPRESSION: 1. Mild CHF. 2. Bilateral lower lobe opacities which may represent atelectasis and/or pneumonia. Electronically Signed   By: Kerby Moors M.D.   On: 10/19/2019 11:33   Dg Chest Portable 1 View  Result Date: 10/17/2019 CLINICAL DATA:  Shortness of breath. Hypoxia. Dialysis patient. EXAM: PORTABLE CHEST 1 VIEW COMPARISON:  Two-view chest x-ray 10/01/2017 FINDINGS: Chronic tracheostomy is noted. The heart is enlarged. Mild pulmonary vascular congestion is present. Bibasilar airspace disease is new. Bilateral pleural effusions are present. There is no significant consolidation in the upper lobes. The visualized soft tissues and bony thorax are unremarkable. IMPRESSION: 1. Cardiomegaly with mild pulmonary vascular congestion and bilateral pleural effusions compatible with congestive heart failure. 2. Bibasilar airspace disease may represent atelectasis, but is concerning  for bibasilar pneumonia. Electronically Signed   By: San Morelle M.D.   On: 10/17/2019 05:18    Assessment & Plan    1.CHF-has HFpEF-ef normal ef by echo at [facility] in December of last year.Will attempt to control with iv lasix 40 bid. Carvedilol 25 mg bid.Echo showedejection fraction of 55 to 60% with moderate LVH. RV function is normal. Mild to moderate sclerosis of the aortic valve with no aortic stenosis. Estimated RV systolic pressure of [id number] mmHg. BNP is improvedto 796. Improving with hemodialysis. CXR yesterday revealed improvement in bibasilaar atelectasis and less effusions.    2.Paroxysmal afib-is on apixiban 2.5 mg bid and amiodarone200 mgdaily. Currently in nsr. Will continue with this regimen.   3.Cad/mitral valve disease. -Had ruptured posterior papillarymuscle with cardiogenic shock in 2017 . Had inferior posterior stemi in 2013 with a des to [place]. Underwent emergent mvr with a tissue prosthesis due to ruptured posterior papillary muscle. Post op course complicated by ckd  and prolonged intubation requiring trach. Currently appears stable from ischemic [place]. Vlve is functioning well. Conitnue with current outpatient regimen for now. Includes asa 81 mg daily and atorvastatin at 80 mg daily.   4.HTN-currently controlled after hd. Continue with carvedilol at 25 mg dialy and follow pressures.  Continue with midodrine and during dialysis to support blood pressure.  5.Hyperlipidemia-conitnue with atorvastatin.   6.DM-insultin as is being done.  7.  End-stage renal disease-complicated by pulmonary edema.  Continuing to dialyze likely will dialyze today.  Appreciate nephrology's input.On lasix 40 iv bid.   Signed, Javier Docker Amai Cappiello MD 10/24/2019, 8:36 AM  Pager: [phone]

## 2019-10-24 NOTE — Plan of Care (Signed)
  Problem: Fluid Volume: Goal: Compliance with measures to maintain balanced fluid volume will improve Outcome: Progressing   Problem: Clinical Measurements: Goal: Complications related to the disease process, condition or treatment will be avoided or minimized Outcome: Progressing   Problem: Clinical Measurements: Goal: Ability to maintain clinical measurements within normal limits will improve Outcome: Progressing Goal: Cardiovascular complication will be avoided Outcome: Progressing   Problem: Activity: Goal: Risk for activity intolerance will decrease Outcome: Progressing

## 2019-10-24 NOTE — Discharge Summary (Signed)
Physician Discharge Summary  MERCADIES CO YYQ:[id number] DOB: 08-06-1956 DOA: 10/17/2019  PCP: System, Pcp Not In  Admit date: 10/17/2019 Discharge date: 10/24/2019  Recommendations for Outpatient Follow-up:  1. Follow up with PCP in 7-10 days. 2. Keep appointment with dialysis 3. Follow up with transplant center as directed. 4. Home health PT/OT. 5. Follow up with nephrology as directed.  Follow-up Information    Teodoro Spray, MD. Go on 10/28/2019.   Specialty: Cardiology Why: APPOINTMENT AT 10:30AM Contact information: [place] [address] [phone]          Discharge Diagnoses: Principal diagnosis is #1 1. Acute on chronic diastolic CHF  2. Acute hypoxic respiratory failure 3. Pulmonary edema 4. Klebsiella oxytoca Community Acquired Pneumonia 5. ESRD on HD, On list for renal transplant 6. Hypertensive emergency with pulmonary edema 7. Dyslipidemia 8. DM II with hyperglycemia 9. GERD 10. Paroxysmal atrial fibrillation 11. 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  Filed Weights   10/22/19 1345 10/23/19 0342 10/24/19 0350  Weight: 63.9 kg 63.2 kg 64.5 kg    History of present illness:  Deborah [name]  is a 63 y.o. African-American female with a known history of type II arrhythmias, hypertension, dyslipidemia and end-stage renal disease on hemodialysis on Monday Wednesday Friday, who presented to the emergency room with acute onset of worsening dyspnea and hypoxia with a pulse ox entry of 81% on room air.  Patient is status post tracheostomy but is not on home O2.  She has been having dry cough here occasionally productive of clear sputum at home.  She admitted to orthopnea and paroxysmal nocturnal dyspnea.  She had no fever or chills.  No worsening lower extremity edema.  No headache or dizziness or blurred  vision.  No nausea vomiting or abdominal pain.  She did not miss any hemodialysis session.  Upon presentation to the emergency room, blood pressure was 184/95 and later 200/89 with pulse currently of 96% on her percent nonrebreather and a temperature of 99.2.  Labs revealed mild cytosis with WBC of 12.7 and neutrophilia.  ANC/ALC ratio was 9.7/1.4.  BNP was 1350 and high-sensitivity troponin I of 37.  CMP showed BUN of 49 creatinine 6.47.  Blood cultures were ordered.  The patient was given IV cefepime and vancomycin as well as IV nitroglycerin drip.  She will be admitted to a telemetry bed for further evaluation and management.  Hospital Course:  [name] a85 y.o.African-American femalewith a known history of type II arrhythmias, hypertension, dyslipidemia and end-stage renal disease on hemodialysis on Monday Wednesday Friday, who presented to the emergency room with acute onset of worsening dyspnea and hypoxia with a pulse ox entry of 81% on room air. Patient is status post tracheostomy but is not on home O2. She has been having dry cough here occasionally productive of clear sputum at home. She admitted to orthopnea and paroxysmal nocturnal dyspnea. She had no fever or chills.No worsening lower extremity edema. No headache or dizziness or blurred vision. No nausea vomiting or abdominal pain. She did not miss any hemodialysis session.  Upon presentation to the emergency room, blood pressure was 184/95and later 200/89with pulse currently of 96% on her percent nonrebreather and a temperature of 99.2. Labs revealed mild cytosis with WBC of 12.7 and neutrophilia. ANC/ALC ratio was 9.7/1.4. BNP was 1350 and high-sensitivity troponin I of 37. CMP showed BUN of 49 creatinine  6.47. Blood cultures were ordered.  The patient was given IV cefepime and vancomycin as well as IV nitroglycerin drip. She will be admitted to a telemetry bed for further evaluation and management.   Patient was seen and examined at her bedside in the ED. Reports dyspnea with minimal exertion worsened with ambulation. Also reports pleuritic pain with nonproductive cough.  COVID-19 test in process.Plan for HD today. Last HD was on Wednesday, 10/15/2019. Independently viewed chest x-ray done on admission which showed cardiomegaly with increase in pulmonary vascularity and bilateral pleural effusions. BNP elevated greater than 1300. Last 2D echo done in2018 showed normal LVEF 55 to 60%. A repeat TTEis pending.  Currently onRocephin and azithromycin for Klebsiella oxytoca pneumonia.Resistant to ampicillin and unasyn. Infectious disease was consulted. They advised that antibiotics could be stopped. The patient is being weaned off of O2. She will be discharged to home in fair condition.  Today's assessment: S: The patient is resting comfortably. No new complaints. O: Vitals:  Vitals:   10/24/19 1403 10/24/19 1424  BP:    Pulse: 79 90  Resp:    Temp:    SpO2: 98% 96%   Constitutional:   The patient is awake, alert, and oriented x 3. No acute distress. Respiratory:   No increased work of breathing.  No wheezes, rales, or rhonchi  No tactile fremitus Cardiovascular:   Regular rate and rhythm  No murmurs, ectopy, or gallups.  No lateral PMI. No thrills. Abdomen:   Abdomen is soft, non-tender, non-distended  No hernias, masses, or organomegaly  Normoactive bowel sounds.  Musculoskeletal:   No cyanosis, clubbing, or edema Skin:   No rashes, lesions, ulcers  palpation of skin: no induration or nodules Neurologic:   CN 2-12 intact  Sensation all 4 extremities intact Psychiatric:   Mental status ? Mood, affect appropriate ? Orientation to person, place, time   judgment and insight appear intact  Discharge Instructions  Discharge Instructions    Activity as tolerated - No restrictions   Complete by: As directed    Call MD for:  persistant nausea  and vomiting   Complete by: As directed    Call MD for:  severe uncontrolled pain   Complete by: As directed    Diet - low sodium heart healthy   Complete by: As directed    Discharge instructions   Complete by: As directed    Follow up with PCP in 7-10 days. Keep dialysis appointments. Keep appointments with transplant center.   Increase activity slowly   Complete by: As directed      Allergies as of 10/24/2019   No Known Allergies     Medication List    STOP taking these medications   amLODipine 5 MG tablet Commonly known as: NORVASC   clopidogrel 75 MG tablet Commonly known as: PLAVIX   Levemir FlexTouch 100 UNIT/ML Pen Generic drug: Insulin Detemir   olmesartan 20 MG tablet Commonly known as: BENICAR   Renagel 800 MG tablet Generic drug: sevelamer     TAKE these medications   acetaminophen 325 MG tablet Commonly known as: TYLENOL Take 650 mg by mouth.   albuterol (2.5 MG/3ML) 0.083% nebulizer solution Commonly known as: PROVENTIL Take 3 mLs (2.5 mg total) by nebulization 4 (four) times daily - after meals and at bedtime.   ALPRAZolam 0.25 MG tablet Commonly known as: XANAX Take 1 tablet (0.25 mg total) by mouth 2 (two) times daily as needed for anxiety.   amiodarone 200 MG tablet Commonly known  as: PACERONE Take 1 tablet (200 mg total) by mouth daily.   apixaban 2.5 MG Tabs tablet Commonly known as: ELIQUIS Take 2.5 mg by mouth 2 (two) times daily.   aspirin 81 MG EC tablet Take 1 tablet (81 mg total) by mouth daily.   atorvastatin 80 MG tablet Commonly known as: LIPITOR Take 80 mg by mouth daily at 6 PM.   carvedilol 25 MG tablet Commonly known as: COREG Take 25 mg by mouth 2 (two) times daily.   furosemide 40 MG tablet Commonly known as: LASIX Take 40 mg by mouth daily. ON mornings of dialysis   gabapentin 300 MG capsule Commonly known as: NEURONTIN TAKE 1 CAPSULE BY MOUTH THREE TIMES DAILY   GlucoCom Blood Glucose Monitor Devi Use  to check blood sugars 4-6 times per day Disp per insurance company kit ICD-10 E11.9   insulin glargine 100 UNIT/ML injection Commonly known as: LANTUS Inject 14 Units into the skin at bedtime.   lidocaine-prilocaine cream Commonly known as: EMLA Apply 1 application topically every dialysis.   Melatonin 3 MG Tabs Take 3-6 mg by mouth at bedtime.   midodrine 5 MG tablet Commonly known as: PROAMATINE Take 5 mg by mouth 3 (three) times a week.   NONFORMULARY OR COMPOUNDED ITEM See pharmacy note   pantoprazole 40 MG tablet Commonly known as: PROTONIX Take 1 tablet (40 mg total) by mouth 2 (two) times daily.   polyethylene glycol 17 g packet Commonly known as: MIRALAX / GLYCOLAX Take 17 g by mouth daily.   QUEtiapine 50 MG tablet Commonly known as: SEROQUEL Take 25 mg by mouth 3 (three) times daily as needed.   Rena-Vite Rx 1 MG Tabs Take 1 tablet by mouth daily.   senna-docusate 8.6-50 MG tablet Commonly known as: Senokot-S Take 2 tablets by mouth at bedtime.   sevelamer carbonate 800 MG tablet Commonly known as: RENVELA Take 1,600 mg by mouth 3 (three) times daily with meals.   traZODone 50 MG tablet Commonly known as: DESYREL Take 50 mg by mouth at bedtime as needed for sleep.            Durable Medical Equipment  (From admission, onward)         Start     Ordered   10/24/19 0949  DME Oxygen  Once    Question Answer Comment  Length of Need Lifetime   Frequency Continuous (stationary and portable oxygen unit needed)   Oxygen conserving device Yes   Oxygen delivery system Gas      10/24/19 0949         No Known Allergies  The results of significant diagnostics from this hospitalization (including imaging, microbiology, ancillary and laboratory) are listed below for reference.    Significant Diagnostic Studies: Dg Chest Port 1 View  Result Date: 10/23/2019 CLINICAL DATA:  Short of breath. Hypoxia. EXAM: PORTABLE CHEST 1 VIEW COMPARISON:   Radiograph 10/19/2019 FINDINGS: Tracheostomy tube unchanged in position. Sternotomy wires overlies stable cardiac silhouette. There is improvement in LEFT basilar atelectasis. Decrease in small effusions. Mild bibasilar atelectasis remains. No edema infiltrate or pneumothorax. IMPRESSION: 1. Improvement in LEFT basilar atelectasis and decreased effusions. 2. Persistent mild bibasilar atelectasis. Electronically Signed   By: Suzy Bouchard M.D.   On: 10/23/2019 09:56   Dg Chest Port 1 View  Result Date: 10/19/2019 CLINICAL DATA:  Community acquired pneumonia EXAM: PORTABLE CHEST 1 VIEW COMPARISON:  10/17/2019 FINDINGS: Tip of tracheostomy tube is above the carina. Previous median sternotomy and  mitral valvuloplasty. Mild cardiac enlargement. Bilateral pleural effusions and increased interstitial edema is noted. Atelectasis and/or airspace disease noted in the left lower lobe and right lung base. IMPRESSION: 1. Mild CHF. 2. Bilateral lower lobe opacities which may represent atelectasis and/or pneumonia. Electronically Signed   By: Kerby Moors M.D.   On: 10/19/2019 11:33   Dg Chest Portable 1 View  Result Date: 10/17/2019 CLINICAL DATA:  Shortness of breath. Hypoxia. Dialysis patient. EXAM: PORTABLE CHEST 1 VIEW COMPARISON:  Two-view chest x-ray 10/01/2017 FINDINGS: Chronic tracheostomy is noted. The heart is enlarged. Mild pulmonary vascular congestion is present. Bibasilar airspace disease is new. Bilateral pleural effusions are present. There is no significant consolidation in the upper lobes. The visualized soft tissues and bony thorax are unremarkable. IMPRESSION: 1. Cardiomegaly with mild pulmonary vascular congestion and bilateral pleural effusions compatible with congestive heart failure. 2. Bibasilar airspace disease may represent atelectasis, but is concerning for bibasilar pneumonia. Electronically Signed   By: San Morelle M.D.   On: 10/17/2019 05:18    Microbiology: Recent  Results (from the past 240 hour(s))  SARS CORONAVIRUS 2 (TAT 6-24 HRS) Nasopharyngeal Nasopharyngeal Swab     Status: None   Collection Time: 10/17/19  4:45 AM   Specimen: Nasopharyngeal Swab  Result Value Ref Range Status   SARS Coronavirus 2 NEGATIVE NEGATIVE Final    Comment: (NOTE) SARS-CoV-2 target nucleic acids are NOT DETECTED. The SARS-CoV-2 RNA is generally detectable in upper and lower respiratory specimens during the acute phase of infection. Negative results do not preclude SARS-CoV-2 infection, do not rule out co-infections with other pathogens, and should not be used as the sole basis for treatment or other patient management decisions. Negative results must be combined with clinical observations, patient history, and epidemiological information. The expected result is Negative. Fact Sheet for Patients: SugarRoll.be Fact Sheet for Healthcare Providers: https://www.woods-mathews.com/ This test is not yet approved or cleared by the [place] FDA and  has been authorized for detection and/or diagnosis of SARS-CoV-2 by FDA under an Emergency Use Authorization (EUA). This EUA will remain  in effect (meaning this test can be used) for the duration of the COVID-19 declaration under Section 56 4(b)(1) of the Act, 21 U.S.C. section 360bbb-3(b)(1), unless the authorization is terminated or revoked sooner. Performed at [facility], [place] [address], [place], [place] [address]   Culture, respiratory     Status: None   Collection Time: 10/17/19 12:42 PM   Specimen: Tracheal Aspirate  Result Value Ref Range Status   Specimen Description   Final    TRACHEAL ASPIRATE Performed at [facility], [address], [place], [place] [address]    Special Requests   Final    NONE Performed at [facility], [place], [place] [address]    Gram Stain   Final    ABUNDANT WBC PRESENT, PREDOMINANTLY PMN  ABUNDANT GRAM POSITIVE RODS FEW GRAM POSITIVE COCCI Performed at [facility], [place] [address] [place], [place] [address]    Culture   Final    FEW KLEBSIELLA OXYTOCA FEW STAPHYLOCOCCUS AUREUS    Report Status 10/21/2019 FINAL  Final   Organism ID, Bacteria KLEBSIELLA OXYTOCA  Final   Organism ID, Bacteria STAPHYLOCOCCUS AUREUS  Final      Susceptibility   Klebsiella oxytoca - MIC*    AMPICILLIN >=32 RESISTANT Resistant     CEFAZOLIN 16 SENSITIVE Sensitive     CEFEPIME <=1 SENSITIVE Sensitive  CEFTAZIDIME <=1 SENSITIVE Sensitive     CEFTRIAXONE <=1 SENSITIVE Sensitive     CIPROFLOXACIN <=0.25 SENSITIVE Sensitive     GENTAMICIN <=1 SENSITIVE Sensitive     IMIPENEM <=0.25 SENSITIVE Sensitive     TRIMETH/SULFA <=20 SENSITIVE Sensitive     AMPICILLIN/SULBACTAM 16 INTERMEDIATE Intermediate     PIP/TAZO <=4 SENSITIVE Sensitive     Extended ESBL NEGATIVE Sensitive     * FEW KLEBSIELLA OXYTOCA   Staphylococcus aureus - MIC*    CIPROFLOXACIN <=0.5 SENSITIVE Sensitive     ERYTHROMYCIN <=0.25 SENSITIVE Sensitive     GENTAMICIN <=0.5 SENSITIVE Sensitive     OXACILLIN <=0.25 SENSITIVE Sensitive     TETRACYCLINE <=1 SENSITIVE Sensitive     VANCOMYCIN 1 SENSITIVE Sensitive     TRIMETH/SULFA <=10 SENSITIVE Sensitive     CLINDAMYCIN <=0.25 SENSITIVE Sensitive     RIFAMPIN <=0.5 SENSITIVE Sensitive     Inducible Clindamycin NEGATIVE Sensitive     * FEW STAPHYLOCOCCUS AUREUS  CULTURE, BLOOD (ROUTINE X 2) w Reflex to ID Panel     Status: None   Collection Time: 10/17/19 12:50 PM   Specimen: BLOOD  Result Value Ref Range Status   Specimen Description BLOOD PORTA CATH  Final   Special Requests   Final    BOTTLES DRAWN AEROBIC AND ANAEROBIC Blood Culture results may not be optimal due to an excessive volume of blood received in culture bottles   Culture   Final    NO GROWTH 5 DAYS Performed at [facility], [address] [place], [place] [address]    Report Status  10/22/2019 FINAL  Final  CULTURE, BLOOD (ROUTINE X 2) w Reflex to ID Panel     Status: None   Collection Time: 10/17/19  2:20 PM   Specimen: BLOOD  Result Value Ref Range Status   Specimen Description BLOOD RT HAND  Final   Special Requests   Final    BOTTLES DRAWN AEROBIC AND ANAEROBIC Blood Culture results may not be optimal due to an inadequate volume of blood received in culture bottles   Culture   Final    NO GROWTH 5 DAYS Performed at [facility], [address] [place], [place] [address]    Report Status 10/22/2019 FINAL  Final  MRSA PCR Screening     Status: None   Collection Time: 10/17/19  9:50 PM   Specimen: Nasopharyngeal  Result Value Ref Range Status   MRSA by PCR NEGATIVE NEGATIVE Final    Comment:        The GeneXpert MRSA Assay (FDA approved for NASAL specimens only), is one component of a comprehensive MRSA colonization surveillance program. It is not intended to diagnose MRSA infection nor to guide or monitor treatment for MRSA infections. Performed at [facility], [address] [place], [place] [address]   Culture, respiratory     Status: None   Collection Time: 10/18/19  2:45 PM   Specimen: Tracheal Aspirate  Result Value Ref Range Status   Specimen Description   Final    TRACHEAL ASPIRATE Performed at [facility], [address] [place], [place] [address]    Special Requests   Final    NONE Performed at [facility], [place], [place] [address]    Gram Stain   Final    ABUNDANT WBC PRESENT,BOTH PMN AND MONONUCLEAR FEW GRAM POSITIVE COCCI FEW GRAM POSITIVE RODS FEW GRAM NEGATIVE RODS Performed at [facility], [address] [place], [place] [address]    Culture FEW KLEBSIELLA OXYTOCA  Final   Report Status 10/21/2019 FINAL  Final   Organism ID, Bacteria KLEBSIELLA OXYTOCA  Final      Susceptibility   Klebsiella oxytoca - MIC*    AMPICILLIN >=32 RESISTANT Resistant      CEFAZOLIN <=4 SENSITIVE Sensitive     CEFEPIME <=1 SENSITIVE Sensitive     CEFTAZIDIME <=1 SENSITIVE Sensitive     CEFTRIAXONE <=1 SENSITIVE Sensitive     CIPROFLOXACIN <=0.25 SENSITIVE Sensitive     GENTAMICIN <=1 SENSITIVE Sensitive     IMIPENEM <=0.25 SENSITIVE Sensitive     TRIMETH/SULFA <=20 SENSITIVE Sensitive     AMPICILLIN/SULBACTAM 16 INTERMEDIATE Intermediate     PIP/TAZO <=4 SENSITIVE Sensitive     Extended ESBL NEGATIVE Sensitive     * FEW KLEBSIELLA OXYTOCA     Labs: Basic Metabolic Panel: Recent Labs  Lab 10/20/19 0523 10/20/19 0923 10/21/19 0546 10/22/19 0429 10/23/19 0639  NA 138 138 136 137 136  K 4.2 3.9 4.1 4.4 4.3  CL 100 100 98 96* 96*  CO2 _0 GLUCOSE 212* 157* 161* 230* 151*  BUN 70* 77* 37* 61* 36*  CREATININE 8.66* 9.60* 5.88* 8.01* 5.51*  CALCIUM 9.0 9.0 8.5* 8.9 8.7*  PHOS  --  2.9  --   --   --    Liver Function Tests: Recent Labs  Lab 10/20/19 0923  ALBUMIN 3.0*   No results for input(s): LIPASE, AMYLASE in the last 168 hours. No results for input(s): AMMONIA in the last 168 hours. CBC: Recent Labs  Lab 10/19/19 0553 10/20/19 0523 10/21/19 0546 10/22/19 0429 10/23/19 0639  WBC 13.2* 14.0* 13.5* 11.4* 10.6*  NEUTROABS  --  11.2*  --   --   --   HGB 8.3* 8.8* 8.9* 8.4* 8.4*  HCT 25.4* 25.6* 25.8* 25.0* 25.5*  MCV 90.4 87.1 86.9 87.1 91.1  PLT 240 263 280 279 298   Cardiac Enzymes: No results for input(s): CKTOTAL, CKMB, CKMBINDEX, TROPONINI in the last 168 hours. BNP: BNP (last 3 results) Recent Labs    10/17/19 0445 10/18/19 0517  BNP 1,350.0* 796.0*    ProBNP (last 3 results) No results for input(s): PROBNP in the last 8760 hours.  CBG: Recent Labs  Lab 10/23/19 1134 10/23/19 1656 10/23/19 2155 10/24/19 0745 10/24/19 1338  GLUCAP 131* 232* 175* 188* 97    Active Problems:   Acute CHF (congestive heart failure) ([name])   Time coordinating discharge: 38 minutes.  Signed:        Jaydee Ingman, DO  [facility]  10/24/2019, 4:03 PM

## 2019-10-24 NOTE — Progress Notes (Signed)
[facility]  ROUNDING NOTE   Subjective:   Denies any acute complaints Currently requiring high flow oxygen- 6 L   11/19 0701 - 11/20 0700 In: 720 [P.O.:720] Out: 0   . Seen during HD. Tolerating well   HEMODIALYSIS FLOWSHEET:  Blood Flow Rate (mL/min): 400 mL/min Arterial Pressure (mmHg): -140 mmHg Venous Pressure (mmHg): 180 mmHg Transmembrane Pressure (mmHg): 60 mmHg Ultrafiltration Rate (mL/min): 570 mL/min Dialysate Flow Rate (mL/min): 600 ml/min Conductivity: Machine : 13.8 Conductivity: Machine : 13.8 Dialysis Fluid Bolus: Normal Saline Bolus Amount (mL): 250 mL        Objective:  Vital signs in last 24 hours:  Temp:  [98.2 F (36.8 C)-99 F (37.2 C)] 99 F (37.2 C) (11/20 1349) Pulse Rate:  [65-90] 90 (11/20 1424) Resp:  [8-23] 19 (11/20 1243) BP: (117-178)/(59-76) 158/69 (11/20 1349) SpO2:  [96 %-100 %] 96 % (11/20 1424) FiO2 (%):  [28 %] 28 % (11/20 1345) Weight:  [64.5 kg] 64.5 kg (11/20 0350)  Weight change: -0.699 kg Filed Weights   10/22/19 1345 10/23/19 0342 10/24/19 0350  Weight: 63.9 kg 63.2 kg 64.5 kg    Intake/Output: I/O last 3 completed shifts: In: 42 [P.O.:720] Out: 0    Intake/Output this shift:  Total I/O In: -  Out: 1800 [Other:1500; Stool:300]  Physical Exam: General: NAD  Head: Normocephalic, atraumatic. Moist oral mucosal membranes  Eyes: Anicteric,  Neck: +tracheostomy  Lungs:  Clear to auscultation. Improved exam  Heart: Regular rate and rhythm  Abdomen:  Soft, nontender  Extremities:  trace peripheral edema.  Neurologic: Nonfocal, able to follow simple commands  Skin: No lesions  Access: Left AVG    Basic Metabolic Panel: Recent Labs  Lab 10/20/19 0523 10/20/19 0923 10/21/19 0546 10/22/19 0429 10/23/19 0639  NA 138 138 136 137 136  K 4.2 3.9 4.1 4.4 4.3  CL 100 100 98 96* 96*  CO2 24 23 26 25 25   GLUCOSE 212* 157* 161* 230* 151*  BUN 70* 77* 37* 61* 36*  CREATININE 8.66* 9.60* 5.88*  8.01* 5.51*  CALCIUM 9.0 9.0 8.5* 8.9 8.7*  PHOS  --  2.9  --   --   --     Liver Function Tests: Recent Labs  Lab 10/20/19 0923  ALBUMIN 3.0*   No results for input(s): LIPASE, AMYLASE in the last 168 hours. No results for input(s): AMMONIA in the last 168 hours.  CBC: Recent Labs  Lab 10/19/19 0553 10/20/19 0523 10/21/19 0546 10/22/19 0429 10/23/19 0639  WBC 13.2* 14.0* 13.5* 11.4* 10.6*  NEUTROABS  --  11.2*  --   --   --   HGB 8.3* 8.8* 8.9* 8.4* 8.4*  HCT 25.4* 25.6* 25.8* 25.0* 25.5*  MCV 90.4 87.1 86.9 87.1 91.1  PLT 240 263 280 279 298    Cardiac Enzymes: No results for input(s): CKTOTAL, CKMB, CKMBINDEX, TROPONINI in the last 168 hours.  BNP: Invalid input(s): POCBNP  CBG: Recent Labs  Lab 10/23/19 1134 10/23/19 1656 10/23/19 2155 10/24/19 0745 10/24/19 1338  GLUCAP 131* 232* 175* 188* 38    Microbiology: Results for orders placed or performed during the hospital encounter of 10/17/19  SARS CORONAVIRUS 2 (TAT 6-24 HRS) Nasopharyngeal Nasopharyngeal Swab     Status: None   Collection Time: 10/17/19  4:45 AM   Specimen: Nasopharyngeal Swab  Result Value Ref Range Status   SARS Coronavirus 2 NEGATIVE NEGATIVE Final    Comment: (NOTE) SARS-CoV-2 target nucleic acids are NOT DETECTED. The SARS-CoV-2  RNA is generally detectable in upper and lower respiratory specimens during the acute phase of infection. Negative results do not preclude SARS-CoV-2 infection, do not rule out co-infections with other pathogens, and should not be used as the sole basis for treatment or other patient management decisions. Negative results must be combined with clinical observations, patient history, and epidemiological information. The expected result is Negative. Fact Sheet for Patients: SugarRoll.be Fact Sheet for Healthcare Providers: https://www.woods-mathews.com/ This test is not yet approved or cleared by the [place] FDA and  has been authorized for detection and/or diagnosis of SARS-CoV-2 by FDA under an Emergency Use Authorization (EUA). This EUA will remain  in effect (meaning this test can be used) for the duration of the COVID-19 declaration under Section 56 4(b)(1) of the Act, 21 U.S.C. section 360bbb-3(b)(1), unless the authorization is terminated or revoked sooner. Performed at [facility], [place] [address], [place], [place] [address]   Culture, respiratory     Status: None   Collection Time: 10/17/19 12:42 PM   Specimen: Tracheal Aspirate  Result Value Ref Range Status   Specimen Description   Final    TRACHEAL ASPIRATE Performed at [facility], [address], [place], [place] [address]    Special Requests   Final    NONE Performed at [facility], [place], [place] [address]    Gram Stain   Final    ABUNDANT WBC PRESENT, PREDOMINANTLY PMN ABUNDANT GRAM POSITIVE RODS FEW GRAM POSITIVE COCCI Performed at [facility], [place] [address], [place], [place] [address]    Culture   Final    FEW KLEBSIELLA OXYTOCA FEW STAPHYLOCOCCUS AUREUS    Report Status 10/21/2019 FINAL  Final   Organism ID, Bacteria KLEBSIELLA OXYTOCA  Final   Organism ID, Bacteria STAPHYLOCOCCUS AUREUS  Final      Susceptibility   Klebsiella oxytoca - MIC*    AMPICILLIN >=32 RESISTANT Resistant     CEFAZOLIN 16 SENSITIVE Sensitive     CEFEPIME <=1 SENSITIVE Sensitive     CEFTAZIDIME <=1 SENSITIVE Sensitive     CEFTRIAXONE <=1 SENSITIVE Sensitive     CIPROFLOXACIN <=0.25 SENSITIVE Sensitive     GENTAMICIN <=1 SENSITIVE Sensitive     IMIPENEM <=0.25 SENSITIVE Sensitive     TRIMETH/SULFA <=20 SENSITIVE Sensitive     AMPICILLIN/SULBACTAM 16 INTERMEDIATE Intermediate     PIP/TAZO <=4 SENSITIVE Sensitive     Extended ESBL NEGATIVE Sensitive     * FEW KLEBSIELLA OXYTOCA   Staphylococcus aureus - MIC*    CIPROFLOXACIN <=0.5 SENSITIVE Sensitive     ERYTHROMYCIN  <=0.25 SENSITIVE Sensitive     GENTAMICIN <=0.5 SENSITIVE Sensitive     OXACILLIN <=0.25 SENSITIVE Sensitive     TETRACYCLINE <=1 SENSITIVE Sensitive     VANCOMYCIN 1 SENSITIVE Sensitive     TRIMETH/SULFA <=10 SENSITIVE Sensitive     CLINDAMYCIN <=0.25 SENSITIVE Sensitive     RIFAMPIN <=0.5 SENSITIVE Sensitive     Inducible Clindamycin NEGATIVE Sensitive     * FEW STAPHYLOCOCCUS AUREUS  CULTURE, BLOOD (ROUTINE X 2) w Reflex to ID Panel     Status: None   Collection Time: 10/17/19 12:50 PM   Specimen: BLOOD  Result Value Ref Range Status   Specimen Description BLOOD PORTA CATH  Final   Special Requests   Final    BOTTLES DRAWN AEROBIC AND ANAEROBIC Blood Culture results may not be optimal due to an excessive volume of blood received in  culture bottles   Culture   Final    NO GROWTH 5 DAYS Performed at [facility], [address] [place], [place] [address]    Report Status 10/22/2019 FINAL  Final  CULTURE, BLOOD (ROUTINE X 2) w Reflex to ID Panel     Status: None   Collection Time: 10/17/19  2:20 PM   Specimen: BLOOD  Result Value Ref Range Status   Specimen Description BLOOD RT HAND  Final   Special Requests   Final    BOTTLES DRAWN AEROBIC AND ANAEROBIC Blood Culture results may not be optimal due to an inadequate volume of blood received in culture bottles   Culture   Final    NO GROWTH 5 DAYS Performed at [facility], [address] [place], [place] [address]    Report Status 10/22/2019 FINAL  Final  MRSA PCR Screening     Status: None   Collection Time: 10/17/19  9:50 PM   Specimen: Nasopharyngeal  Result Value Ref Range Status   MRSA by PCR NEGATIVE NEGATIVE Final    Comment:        The GeneXpert MRSA Assay (FDA approved for NASAL specimens only), is one component of a comprehensive MRSA colonization surveillance program. It is not intended to diagnose MRSA infection nor to guide or monitor treatment for MRSA infections. Performed at  [facility], [place] [place], [place] [address]   Culture, respiratory     Status: None   Collection Time: 10/18/19  2:45 PM   Specimen: Tracheal Aspirate  Result Value Ref Range Status   Specimen Description   Final    TRACHEAL ASPIRATE Performed at [facility], [address] [place], [place] [address]    Special Requests   Final    NONE Performed at [facility], [place], [place] [address]    Gram Stain   Final    ABUNDANT WBC PRESENT,BOTH PMN AND MONONUCLEAR FEW GRAM POSITIVE COCCI FEW GRAM POSITIVE RODS FEW GRAM NEGATIVE RODS Performed at [facility], [place] [address] [place], [place] [address]    Culture FEW KLEBSIELLA OXYTOCA  Final   Report Status 10/21/2019 FINAL  Final   Organism ID, Bacteria KLEBSIELLA OXYTOCA  Final      Susceptibility   Klebsiella oxytoca - MIC*    AMPICILLIN >=32 RESISTANT Resistant     CEFAZOLIN <=4 SENSITIVE Sensitive     CEFEPIME <=1 SENSITIVE Sensitive     CEFTAZIDIME <=1 SENSITIVE Sensitive     CEFTRIAXONE <=1 SENSITIVE Sensitive     CIPROFLOXACIN <=0.25 SENSITIVE Sensitive     GENTAMICIN <=1 SENSITIVE Sensitive     IMIPENEM <=0.25 SENSITIVE Sensitive     TRIMETH/SULFA <=20 SENSITIVE Sensitive     AMPICILLIN/SULBACTAM 16 INTERMEDIATE Intermediate     PIP/TAZO <=4 SENSITIVE Sensitive     Extended ESBL NEGATIVE Sensitive     * FEW KLEBSIELLA OXYTOCA    Coagulation Studies: No results for input(s): LABPROT, INR in the last 72 hours.  Urinalysis: No results for input(s): COLORURINE, LABSPEC, PHURINE, GLUCOSEU, HGBUR, BILIRUBINUR, KETONESUR, PROTEINUR, UROBILINOGEN, NITRITE, LEUKOCYTESUR in the last 72 hours.  Invalid input(s): APPERANCEUR    Imaging: Dg Chest Port 1 View  Result Date: 10/23/2019 CLINICAL DATA:  Short of breath. Hypoxia. EXAM: PORTABLE CHEST 1 VIEW COMPARISON:  Radiograph 10/19/2019 FINDINGS: Tracheostomy tube unchanged in position. Sternotomy wires  overlies stable cardiac silhouette. There is improvement in LEFT basilar atelectasis. Decrease in small effusions. Mild  bibasilar atelectasis remains. No edema infiltrate or pneumothorax. IMPRESSION: 1. Improvement in LEFT basilar atelectasis and decreased effusions. 2. Persistent mild bibasilar atelectasis. Electronically Signed   By: Suzy Bouchard M.D.   On: 10/23/2019 09:56     Medications:   . sodium chloride 250 mL (10/22/19 2004)   . amiodarone  200 mg Oral Daily  . apixaban  2.5 mg Oral BID  . aspirin EC  81 mg Oral Daily  . atorvastatin  80 mg Oral q1800  . carvedilol  25 mg Oral BID  . Chlorhexidine Gluconate Cloth  6 each Topical Q0600  . epoetin (EPOGEN/PROCRIT) injection  10,000 Units Intravenous Q M,W,F-HD  . feeding supplement (NEPRO CARB STEADY)  237 mL Oral BID BM  . furosemide  40 mg Intravenous Q12H  . gabapentin  100 mg Oral BID  . guaiFENesin  600 mg Oral BID  . insulin aspart  0-9 Units Subcutaneous TID WC  . insulin glargine  14 Units Subcutaneous QHS  . ipratropium-albuterol  3 mL Nebulization TID  . Melatonin  5 mg Oral QHS  . multivitamin  1 tablet Oral QHS  . pantoprazole  40 mg Oral BID  . polyethylene glycol  17 g Oral Daily  . senna-docusate  2 tablet Oral QHS  . sevelamer carbonate  1,600 mg Oral TID WC  . sodium chloride flush  3 mL Intravenous Q12H   sodium chloride, acetaminophen, albuterol, ALPRAZolam, chlorpheniramine-HYDROcodone, labetalol, labetalol, midodrine, ondansetron (ZOFRAN) IV, oxyCODONE, QUEtiapine, sodium chloride flush, zolpidem  Assessment/ Plan:  [name] is a 63 y.o. black female with end stage renal disease on hemodialysis, tracheostomy, hypotension, diabetes mellitus type II insulin dependent, diabetic neuropathy,  coronary artery disease, mitral valve repair, atrial fibrrilation (anticoagulation with apixaban)  [facility] Nephrology MWF [facility] left AVG Chronic tracheostomy  1. End Stage Renal Disease: with  pulmonary edema - emergent dialysis on admission - Continue MWF schedule - seen during HD; tolerating well  2. Acute hypoxic Respiratory failure: requiring oxygen. COVID-19 negative chronic tracheostomy  - Klebsiella pneumonia.  Antibiotics as per internal medicine team  3.  Anemia of chronic kidney disease:   Lab Results  Component Value Date   HGB 8.4 (L) 10/23/2019   - EPO with HD treatments.  4. Secondary Hyperparathyroidism: Lab Results  Component Value Date   PTH 270 (H) 10/03/2017   CALCIUM 8.7 (L) 10/23/2019   CAION 1.09 (L) 09/01/2016   PHOS 2.9 10/20/2019   - sevelamer with meals  5.  Atrial fibrillation -Anticoagulation with apixaban   LOS: 7 Cornellius Kropp 11/20/20203:33 PM

## 2019-12-06 ENCOUNTER — Other Ambulatory Visit: Payer: Self-pay | Admitting: Podiatry

## 2020-03-06 ENCOUNTER — Other Ambulatory Visit: Payer: Self-pay | Admitting: Podiatry

## 2020-04-01 ENCOUNTER — Inpatient Hospital Stay
Admission: EM | Admit: 2020-04-01 | Discharge: 2020-04-04 | DRG: 640 | Disposition: A | Discharge status: Home / Self Care | Payer: Medicare Other | Attending: Family Medicine | Admitting: Family Medicine

## 2020-04-01 DIAGNOSIS — I959 Hypotension, unspecified: Secondary | ICD-10-CM | POA: Diagnosis present

## 2020-04-01 DIAGNOSIS — Z8249 Family history of ischemic heart disease and other diseases of the circulatory system: Secondary | ICD-10-CM

## 2020-04-01 DIAGNOSIS — I120 Hypertensive chronic kidney disease with stage 5 chronic kidney disease or end stage renal disease: Secondary | ICD-10-CM | POA: Diagnosis present

## 2020-04-01 DIAGNOSIS — Z9115 Patient's noncompliance with renal dialysis: Secondary | ICD-10-CM

## 2020-04-01 DIAGNOSIS — Z79899 Other long term (current) drug therapy: Secondary | ICD-10-CM

## 2020-04-01 DIAGNOSIS — E8770 Fluid overload, unspecified: Secondary | ICD-10-CM | POA: Diagnosis not present

## 2020-04-01 DIAGNOSIS — E1122 Type 2 diabetes mellitus with diabetic chronic kidney disease: Secondary | ICD-10-CM | POA: Diagnosis present

## 2020-04-01 DIAGNOSIS — J40 Bronchitis, not specified as acute or chronic: Secondary | ICD-10-CM

## 2020-04-01 DIAGNOSIS — I252 Old myocardial infarction: Secondary | ICD-10-CM

## 2020-04-01 DIAGNOSIS — E785 Hyperlipidemia, unspecified: Secondary | ICD-10-CM | POA: Diagnosis present

## 2020-04-01 DIAGNOSIS — J386 Stenosis of larynx: Secondary | ICD-10-CM | POA: Diagnosis present

## 2020-04-01 DIAGNOSIS — I2510 Atherosclerotic heart disease of native coronary artery without angina pectoris: Secondary | ICD-10-CM | POA: Diagnosis present

## 2020-04-01 DIAGNOSIS — Z20822 Contact with and (suspected) exposure to covid-19: Secondary | ICD-10-CM | POA: Diagnosis present

## 2020-04-01 DIAGNOSIS — Z7901 Long term (current) use of anticoagulants: Secondary | ICD-10-CM

## 2020-04-01 DIAGNOSIS — E1140 Type 2 diabetes mellitus with diabetic neuropathy, unspecified: Secondary | ICD-10-CM | POA: Diagnosis present

## 2020-04-01 DIAGNOSIS — Z992 Dependence on renal dialysis: Secondary | ICD-10-CM

## 2020-04-01 DIAGNOSIS — N186 End stage renal disease: Secondary | ICD-10-CM | POA: Diagnosis present

## 2020-04-01 DIAGNOSIS — Z7982 Long term (current) use of aspirin: Secondary | ICD-10-CM

## 2020-04-01 DIAGNOSIS — D631 Anemia in chronic kidney disease: Secondary | ICD-10-CM | POA: Diagnosis present

## 2020-04-01 DIAGNOSIS — Z833 Family history of diabetes mellitus: Secondary | ICD-10-CM

## 2020-04-01 DIAGNOSIS — J9600 Acute respiratory failure, unspecified whether with hypoxia or hypercapnia: Secondary | ICD-10-CM | POA: Diagnosis present

## 2020-04-01 DIAGNOSIS — Z930 Tracheostomy status: Secondary | ICD-10-CM

## 2020-04-01 DIAGNOSIS — E119 Type 2 diabetes mellitus without complications: Secondary | ICD-10-CM

## 2020-04-01 DIAGNOSIS — Z794 Long term (current) use of insulin: Secondary | ICD-10-CM

## 2020-04-01 DIAGNOSIS — J810 Acute pulmonary edema: Secondary | ICD-10-CM

## 2020-04-01 DIAGNOSIS — J9601 Acute respiratory failure with hypoxia: Secondary | ICD-10-CM | POA: Diagnosis present

## 2020-04-01 DIAGNOSIS — N2581 Secondary hyperparathyroidism of renal origin: Secondary | ICD-10-CM | POA: Diagnosis present

## 2020-04-01 DIAGNOSIS — I480 Paroxysmal atrial fibrillation: Secondary | ICD-10-CM | POA: Diagnosis present

## 2020-04-01 DIAGNOSIS — Z803 Family history of malignant neoplasm of breast: Secondary | ICD-10-CM

## 2020-04-01 DIAGNOSIS — Z79890 Hormone replacement therapy (postmenopausal): Secondary | ICD-10-CM

## 2020-04-02 ENCOUNTER — Other Ambulatory Visit: Payer: Self-pay

## 2020-04-02 ENCOUNTER — Emergency Department: Payer: Medicare Other

## 2020-04-02 DIAGNOSIS — Z833 Family history of diabetes mellitus: Secondary | ICD-10-CM | POA: Diagnosis not present

## 2020-04-02 DIAGNOSIS — Z9115 Patient's noncompliance with renal dialysis: Secondary | ICD-10-CM | POA: Diagnosis not present

## 2020-04-02 DIAGNOSIS — I120 Hypertensive chronic kidney disease with stage 5 chronic kidney disease or end stage renal disease: Secondary | ICD-10-CM | POA: Diagnosis present

## 2020-04-02 DIAGNOSIS — Z803 Family history of malignant neoplasm of breast: Secondary | ICD-10-CM | POA: Diagnosis not present

## 2020-04-02 DIAGNOSIS — Z8249 Family history of ischemic heart disease and other diseases of the circulatory system: Secondary | ICD-10-CM | POA: Diagnosis not present

## 2020-04-02 DIAGNOSIS — I252 Old myocardial infarction: Secondary | ICD-10-CM | POA: Diagnosis not present

## 2020-04-02 DIAGNOSIS — E1140 Type 2 diabetes mellitus with diabetic neuropathy, unspecified: Secondary | ICD-10-CM | POA: Diagnosis present

## 2020-04-02 DIAGNOSIS — J9600 Acute respiratory failure, unspecified whether with hypoxia or hypercapnia: Secondary | ICD-10-CM | POA: Diagnosis present

## 2020-04-02 DIAGNOSIS — J386 Stenosis of larynx: Secondary | ICD-10-CM | POA: Diagnosis present

## 2020-04-02 DIAGNOSIS — I480 Paroxysmal atrial fibrillation: Secondary | ICD-10-CM | POA: Diagnosis present

## 2020-04-02 DIAGNOSIS — Z20822 Contact with and (suspected) exposure to covid-19: Secondary | ICD-10-CM | POA: Diagnosis present

## 2020-04-02 DIAGNOSIS — J9601 Acute respiratory failure with hypoxia: Secondary | ICD-10-CM | POA: Diagnosis present

## 2020-04-02 DIAGNOSIS — E8770 Fluid overload, unspecified: Secondary | ICD-10-CM | POA: Diagnosis present

## 2020-04-02 DIAGNOSIS — E119 Type 2 diabetes mellitus without complications: Secondary | ICD-10-CM | POA: Diagnosis not present

## 2020-04-02 DIAGNOSIS — Z7982 Long term (current) use of aspirin: Secondary | ICD-10-CM | POA: Diagnosis not present

## 2020-04-02 DIAGNOSIS — Z992 Dependence on renal dialysis: Secondary | ICD-10-CM | POA: Diagnosis not present

## 2020-04-02 DIAGNOSIS — E785 Hyperlipidemia, unspecified: Secondary | ICD-10-CM | POA: Diagnosis present

## 2020-04-02 DIAGNOSIS — N2581 Secondary hyperparathyroidism of renal origin: Secondary | ICD-10-CM | POA: Diagnosis present

## 2020-04-02 DIAGNOSIS — N186 End stage renal disease: Secondary | ICD-10-CM | POA: Diagnosis present

## 2020-04-02 DIAGNOSIS — I2510 Atherosclerotic heart disease of native coronary artery without angina pectoris: Secondary | ICD-10-CM | POA: Diagnosis present

## 2020-04-02 DIAGNOSIS — I959 Hypotension, unspecified: Secondary | ICD-10-CM | POA: Diagnosis present

## 2020-04-02 DIAGNOSIS — E1122 Type 2 diabetes mellitus with diabetic chronic kidney disease: Secondary | ICD-10-CM | POA: Diagnosis present

## 2020-04-02 DIAGNOSIS — D631 Anemia in chronic kidney disease: Secondary | ICD-10-CM | POA: Diagnosis present

## 2020-04-02 DIAGNOSIS — Z930 Tracheostomy status: Secondary | ICD-10-CM | POA: Diagnosis not present

## 2020-04-02 DIAGNOSIS — Z7901 Long term (current) use of anticoagulants: Secondary | ICD-10-CM | POA: Diagnosis not present

## 2020-04-02 DIAGNOSIS — J810 Acute pulmonary edema: Secondary | ICD-10-CM | POA: Diagnosis present

## 2020-04-02 LAB — CBC WITH DIFFERENTIAL/PLATELET
Abs Immature Granulocytes: 0.03 10*3/uL (ref 0.00–0.07)
Basophils Absolute: 0.1 10*3/uL (ref 0.0–0.1)
Basophils Relative: 1 %
Eosinophils Absolute: 0.3 10*3/uL (ref 0.0–0.5)
Eosinophils Relative: 4 %
HCT: 29.1 % — ABNORMAL LOW (ref 36.0–46.0)
Hemoglobin: 9.6 g/dL — ABNORMAL LOW (ref 12.0–15.0)
Immature Granulocytes: 0 %
Lymphocytes Relative: 14 %
Lymphs Abs: 1.2 10*3/uL (ref 0.7–4.0)
MCH: 30.1 pg (ref 26.0–34.0)
MCHC: 33 g/dL (ref 30.0–36.0)
MCV: 91.2 fL (ref 80.0–100.0)
Monocytes Absolute: 0.8 10*3/uL (ref 0.1–1.0)
Monocytes Relative: 10 %
Neutro Abs: 6 10*3/uL (ref 1.7–7.7)
Neutrophils Relative %: 71 %
Platelets: 260 10*3/uL (ref 150–400)
RBC: 3.19 MIL/uL — ABNORMAL LOW (ref 3.87–5.11)
RDW: 17.4 % — ABNORMAL HIGH (ref 11.5–15.5)
WBC: 8.4 10*3/uL (ref 4.0–10.5)
nRBC: 0 % (ref 0.0–0.2)

## 2020-04-02 LAB — GLUCOSE, CAPILLARY
Glucose-Capillary: 105 mg/dL — ABNORMAL HIGH (ref 70–99)
Glucose-Capillary: 122 mg/dL — ABNORMAL HIGH (ref 70–99)
Glucose-Capillary: 124 mg/dL — ABNORMAL HIGH (ref 70–99)
Glucose-Capillary: 162 mg/dL — ABNORMAL HIGH (ref 70–99)
Glucose-Capillary: 179 mg/dL — ABNORMAL HIGH (ref 70–99)
Glucose-Capillary: 91 mg/dL (ref 70–99)

## 2020-04-02 LAB — BASIC METABOLIC PANEL
Anion gap: 14 (ref 5–15)
BUN: 81 mg/dL — ABNORMAL HIGH (ref 8–23)
CO2: 27 mmol/L (ref 22–32)
Calcium: 8.7 mg/dL — ABNORMAL LOW (ref 8.9–10.3)
Chloride: 100 mmol/L (ref 98–111)
Creatinine, Ser: 9.97 mg/dL — ABNORMAL HIGH (ref 0.44–1.00)
GFR calc Af Amer: 4 mL/min — ABNORMAL LOW (ref >60)
GFR calc non Af Amer: 4 mL/min — ABNORMAL LOW (ref >60)
Glucose, Bld: 166 mg/dL — ABNORMAL HIGH (ref 70–99)
Potassium: 4 mmol/L (ref 3.5–5.1)
Sodium: 141 mmol/L (ref 135–145)

## 2020-04-02 LAB — BLOOD GAS, VENOUS
Acid-Base Excess: 5.1 mmol/L — ABNORMAL HIGH (ref 0.0–2.0)
Bicarbonate: 30.5 mmol/L — ABNORMAL HIGH (ref 20.0–28.0)
FIO2: 0.4
O2 Saturation: 71.9 %
Patient temperature: 37
pCO2, Ven: 47 mmHg (ref 44.0–60.0)
pH, Ven: 7.42 (ref 7.250–7.430)
pO2, Ven: 37 mmHg (ref 32.0–45.0)

## 2020-04-02 LAB — BRAIN NATRIURETIC PEPTIDE: B Natriuretic Peptide: 959 pg/mL — ABNORMAL HIGH (ref 0.0–100.0)

## 2020-04-02 LAB — APTT: aPTT: 38 seconds — ABNORMAL HIGH (ref 24–36)

## 2020-04-02 LAB — RENAL FUNCTION PANEL
Albumin: 3.9 g/dL (ref 3.5–5.0)
Anion gap: 14 (ref 5–15)
BUN: 89 mg/dL — ABNORMAL HIGH (ref 8–23)
CO2: 28 mmol/L (ref 22–32)
Calcium: 8.5 mg/dL — ABNORMAL LOW (ref 8.9–10.3)
Chloride: 101 mmol/L (ref 98–111)
Creatinine, Ser: 9.78 mg/dL — ABNORMAL HIGH (ref 0.44–1.00)
GFR calc Af Amer: 4 mL/min — ABNORMAL LOW (ref >60)
GFR calc non Af Amer: 4 mL/min — ABNORMAL LOW (ref >60)
Glucose, Bld: 132 mg/dL — ABNORMAL HIGH (ref 70–99)
Phosphorus: 4.4 mg/dL (ref 2.5–4.6)
Potassium: 4.4 mmol/L (ref 3.5–5.1)
Sodium: 143 mmol/L (ref 135–145)

## 2020-04-02 LAB — CBC
HCT: 31.9 % — ABNORMAL LOW (ref 36.0–46.0)
Hemoglobin: 10.5 g/dL — ABNORMAL LOW (ref 12.0–15.0)
MCH: 30.1 pg (ref 26.0–34.0)
MCHC: 32.9 g/dL (ref 30.0–36.0)
MCV: 91.4 fL (ref 80.0–100.0)
Platelets: 275 10*3/uL (ref 150–400)
RBC: 3.49 MIL/uL — ABNORMAL LOW (ref 3.87–5.11)
RDW: 17.8 % — ABNORMAL HIGH (ref 11.5–15.5)
WBC: 9.6 10*3/uL (ref 4.0–10.5)
nRBC: 0 % (ref 0.0–0.2)

## 2020-04-02 LAB — MRSA PCR SCREENING: MRSA by PCR: NEGATIVE

## 2020-04-02 LAB — TROPONIN I (HIGH SENSITIVITY): Troponin I (High Sensitivity): 13 ng/L (ref <18)

## 2020-04-02 LAB — HEMOGLOBIN A1C
Hgb A1c MFr Bld: 6.4 % — ABNORMAL HIGH (ref 4.8–5.6)
Mean Plasma Glucose: 136.98 mg/dL

## 2020-04-02 LAB — RESPIRATORY PANEL BY RT PCR (FLU A&B, COVID)
Influenza A by PCR: NEGATIVE
Influenza B by PCR: NEGATIVE
SARS Coronavirus 2 by RT PCR: NEGATIVE

## 2020-04-02 LAB — LACTIC ACID, PLASMA
Lactic Acid, Venous: 1.5 mmol/L (ref 0.5–1.9)
Lactic Acid, Venous: 1.5 mmol/L (ref 0.5–1.9)

## 2020-04-02 LAB — PHOSPHORUS: Phosphorus: 4.3 mg/dL (ref 2.5–4.6)

## 2020-04-02 LAB — HIV ANTIBODY (ROUTINE TESTING W REFLEX): HIV Screen 4th Generation wRfx: NONREACTIVE

## 2020-04-02 LAB — MAGNESIUM: Magnesium: 2 mg/dL (ref 1.7–2.4)

## 2020-04-02 LAB — PROCALCITONIN: Procalcitonin: 0.21 ng/mL

## 2020-04-02 MED ORDER — ONDANSETRON HCL 4 MG/2ML IJ SOLN
4.0000 mg | Freq: Once | INTRAMUSCULAR | Status: AC
  Administered 2020-04-02: 4 mg via INTRAVENOUS

## 2020-04-02 MED ORDER — IPRATROPIUM-ALBUTEROL 0.5-2.5 (3) MG/3ML IN SOLN: 3.0000 mL | Freq: Four times a day (QID) | RESPIRATORY_TRACT | Status: DC | PRN

## 2020-04-02 MED ORDER — INSULIN ASPART 100 UNIT/ML ~~LOC~~ SOLN
0.0000 [IU] | SUBCUTANEOUS | Status: DC
  Administered 2020-04-02: 21:00:00 1 [IU] via SUBCUTANEOUS
  Filled 2020-04-02: qty 1

## 2020-04-02 MED ORDER — FUROSEMIDE 10 MG/ML IJ SOLN
80.0000 mg | Freq: Once | INTRAMUSCULAR | Status: AC
  Administered 2020-04-02: 01:00:00 80 mg via INTRAVENOUS
  Filled 2020-04-02: qty 8

## 2020-04-02 MED ORDER — SODIUM CHLORIDE 0.9 % IV SOLN
2.0000 g | INTRAVENOUS | Status: DC
  Administered 2020-04-02: 01:00:00 2 g via INTRAVENOUS
  Filled 2020-04-02 (×2): qty 20

## 2020-04-02 MED ORDER — HEPARIN SODIUM (PORCINE) 5000 UNIT/ML IJ SOLN: 5000.0000 [IU] | Freq: Three times a day (TID) | INTRAMUSCULAR | Status: DC

## 2020-04-02 MED ORDER — ONDANSETRON HCL 4 MG/2ML IJ SOLN: 4.0000 mg | Freq: Four times a day (QID) | INTRAMUSCULAR | Status: DC | PRN

## 2020-04-02 MED ORDER — SODIUM CHLORIDE 0.9 % IV SOLN
500.0000 mg | INTRAVENOUS | Status: DC
  Administered 2020-04-02: 500 mg via INTRAVENOUS
  Filled 2020-04-02 (×2): qty 500

## 2020-04-02 MED ORDER — CHLORHEXIDINE GLUCONATE CLOTH 2 % EX PADS
6.0000 | MEDICATED_PAD | Freq: Every day | CUTANEOUS | Status: DC
  Administered 2020-04-03 – 2020-04-04 (×2): 6 via TOPICAL

## 2020-04-02 MED ORDER — LIDOCAINE-PRILOCAINE 2.5-2.5 % EX CREA
1.0000 "application " | TOPICAL_CREAM | CUTANEOUS | Status: DC | PRN
  Filled 2020-04-02: qty 5

## 2020-04-02 MED ORDER — ATORVASTATIN CALCIUM 20 MG PO TABS
80.0000 mg | ORAL_TABLET | Freq: Every day | ORAL | Status: DC
  Administered 2020-04-02 – 2020-04-03 (×2): 80 mg via ORAL
  Filled 2020-04-02: qty 4
  Filled 2020-04-02: qty 8

## 2020-04-02 MED ORDER — ASPIRIN EC 81 MG PO TBEC
81.0000 mg | DELAYED_RELEASE_TABLET | Freq: Every day | ORAL | Status: DC
  Administered 2020-04-03 – 2020-04-04 (×2): 81 mg via ORAL
  Filled 2020-04-02 (×2): qty 1

## 2020-04-02 MED ORDER — PANTOPRAZOLE SODIUM 40 MG PO TBEC
40.0000 mg | DELAYED_RELEASE_TABLET | Freq: Two times a day (BID) | ORAL | Status: DC
  Administered 2020-04-02 – 2020-04-04 (×4): 40 mg via ORAL
  Filled 2020-04-02 (×4): qty 1

## 2020-04-02 MED ORDER — MIDODRINE HCL 5 MG PO TABS: 5.0000 mg | ORAL_TABLET | ORAL | Status: DC

## 2020-04-02 MED ORDER — POLYETHYLENE GLYCOL 3350 17 G PO PACK: 17.0000 g | PACK | Freq: Every day | ORAL | Status: DC | PRN

## 2020-04-02 MED ORDER — TRAZODONE HCL 50 MG PO TABS
50.0000 mg | ORAL_TABLET | Freq: Every evening | ORAL | Status: DC | PRN
  Administered 2020-04-02 – 2020-04-03 (×2): 50 mg via ORAL
  Filled 2020-04-02 (×2): qty 1

## 2020-04-02 MED ORDER — AMIODARONE HCL 200 MG PO TABS
200.0000 mg | ORAL_TABLET | Freq: Every day | ORAL | Status: DC
  Administered 2020-04-03 – 2020-04-04 (×2): 200 mg via ORAL
  Filled 2020-04-02 (×2): qty 1

## 2020-04-02 MED ORDER — POLYETHYLENE GLYCOL 3350 17 G PO PACK
17.0000 g | PACK | Freq: Every day | ORAL | Status: DC
  Administered 2020-04-03: 17 g via ORAL
  Filled 2020-04-02 (×2): qty 1

## 2020-04-02 MED ORDER — APIXABAN 2.5 MG PO TABS
2.5000 mg | ORAL_TABLET | Freq: Two times a day (BID) | ORAL | Status: DC
  Administered 2020-04-02 – 2020-04-04 (×4): 2.5 mg via ORAL
  Filled 2020-04-02 (×4): qty 1

## 2020-04-02 MED ORDER — ONDANSETRON HCL 4 MG/2ML IJ SOLN
INTRAMUSCULAR | Status: AC
  Filled 2020-04-02: qty 2

## 2020-04-02 MED ORDER — ALPRAZOLAM 0.25 MG PO TABS: 0.2500 mg | ORAL_TABLET | Freq: Two times a day (BID) | ORAL | Status: DC | PRN

## 2020-04-02 MED ORDER — RENA-VITE PO TABS
1.0000 | ORAL_TABLET | Freq: Every day | ORAL | Status: DC
  Administered 2020-04-03 – 2020-04-04 (×2): 1 via ORAL
  Filled 2020-04-02 (×2): qty 1

## 2020-04-02 MED ORDER — DOCUSATE SODIUM 100 MG PO CAPS: 100.0000 mg | ORAL_CAPSULE | Freq: Two times a day (BID) | ORAL | Status: DC | PRN

## 2020-04-02 MED ORDER — MELATONIN 5 MG PO TABS
5.0000 mg | ORAL_TABLET | Freq: Every day | ORAL | Status: DC
  Administered 2020-04-03: 5 mg via ORAL
  Filled 2020-04-02 (×2): qty 1

## 2020-04-02 MED ORDER — GABAPENTIN 300 MG PO CAPS
300.0000 mg | ORAL_CAPSULE | Freq: Three times a day (TID) | ORAL | Status: DC
  Administered 2020-04-02 – 2020-04-04 (×6): 300 mg via ORAL
  Filled 2020-04-02 (×6): qty 1

## 2020-04-02 NOTE — H&P (Signed)
Name: Deborah [name] MRN: [id number] DOB: 05-Dec-1955    ADMISSION DATE:  04/01/2020 CONSULTATION DATE: 04/02/2020  REFERRING MD : Dr. 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  She presented to [facility] ER on 04/30 via EMS with complaint of shortness of breath, panic attack, and nausea/vomting.  Pt does have a chronic tracheostomy, and at baseline she is on RA. In the ER pt hypoxic with O2 sats in the mid 80's to low 90's, therefore she was placed on 40% trach collar. CXR revealed cardiomegaly and interstitial edema.  Lab results revealed glucose 166, BUN 81, creatinine 9.97, BNP 959, hgb 9.6, lactic acid 1.5, and COVID-19/Influeza PCR negative.  Pt did miss her scheduled HD session on 04/29.  In there ER pt received 80 mg of iv lasix, ceftriaxone, and azithromycin. Nephrology consulted and pt scheduled for hemodialysis on 04/20.  ER physician contacted hospitalist team for hospital  Admission.  However, due to chronic tracheostomy hospitalist team requested pt to be admitted to ICU.  ER physician contacted PCCM team for admission, however pt did not meet ICU criteria.  Therefore, PCCM team admitted pt to the stepdown unit.   PAST MEDICAL HISTORY :   has a past medical history of Diabetes mellitus, Hyperlipidemia, Hypertension, MI, old, and Renal disorder.  has a past surgical history that includes Vaginal delivery; Cardiac catheterization (N/A, 08/15/2016);  Cardiac catheterization (N/A, 08/15/2016); Cardiac catheterization (N/A, 08/18/2016); Cardiac catheterization (N/A, 08/18/2016); Mitral valve repair (N/A, 08/23/2016); Intraoprative transesophageal echocardiogram (N/A, 08/23/2016); Endoharvest vein of greater saphenous vein (Left, 08/23/2016); Cardiac catheterization (Right, 08/23/2016); Insertion of dialysis catheter (Bilateral, 08/31/2016); AV fistula placement (Left, 10/02/2016); tracheostomy reversal; Tracheostomy tube placement (N/A, 11/09/2016); ir generic historical (11/13/2016); ir generic historical (11/13/2016); ir generic historical (11/13/2016); A/V SHUNTOGRAM (N/A, 10/05/2017); and A/V Fistulagram (N/A, 10/05/2017). Prior to Admission medications   Medication Sig Start Date End Date Taking? Authorizing Provider  acetaminophen (TYLENOL) 325 MG tablet Take 650 mg by mouth. 04/06/17   [provider]  albuterol (PROVENTIL) (2.5 MG/3ML) 0.083% nebulizer solution Take 3 mLs (2.5 mg total) by nebulization 4 (four) times daily - after meals and at bedtime. 11/01/16   Love, Ivan Anchors, PA-C  ALPRAZolam (XANAX) 0.25 MG tablet Take 1 tablet (0.25 mg total) by mouth 2 (two) times daily as needed for anxiety. 10/24/19   Swayze, Ava, DO  amiodarone (PACERONE) 200 MG tablet Take 1 tablet (200 mg total) by mouth daily. 10/24/19   Swayze, Ava, DO  apixaban (ELIQUIS) 2.5 MG TABS tablet Take 2.5 mg by mouth 2 (two) times daily.  06/22/17   [provider]  aspirin 81 MG EC tablet Take 1 tablet (81 mg total) by mouth daily. 10/24/19   Swayze, Ava, DO  atorvastatin (LIPITOR) 80 MG tablet Take 80 mg by mouth daily at 6 PM.     [provider]  B Complex-C-Folic Acid (RENA-VITE RX) 1 MG TABS Take 1 tablet by mouth daily. 09/01/19   [provider]  Blood Glucose Monitoring Suppl (GLUCOCOM BLOOD GLUCOSE MONITOR) DEVI Use to check blood sugars 4-6 times per day Disp per insurance company kit ICD-10 E11.9 07/18/17   [provider]    carvedilol (COREG) 25 MG tablet Take 25 mg by mouth 2 (two) times daily. 04/22/18   [provider]  furosemide (LASIX) 40 MG tablet Take 40 mg by mouth daily. ON mornings of dialysis    [provider]  gabapentin (NEURONTIN) 300 MG capsule TAKE 1 CAPSULE BY MOUTH THREE TIMES DAILY 03/08/20   Edrick Kins, DPM  insulin glargine (LANTUS) 100 UNIT/ML injection Inject 14 Units into the skin at bedtime.    [provider]  lidocaine-prilocaine (EMLA) cream Apply 1 application topically every dialysis. 09/26/19   [provider]  Melatonin 3 MG TABS Take 3-6 mg by mouth at bedtime.  04/06/17   [provider]  midodrine (PROAMATINE) 5 MG tablet Take 5 mg by mouth 3 (three) times a week.    [provider]  NONFORMULARY OR COMPOUNDED ITEM See pharmacy note 07/20/17   Edrick Kins, DPM  pantoprazole (PROTONIX) 40 MG tablet Take 1 tablet (40 mg total) by mouth 2 (two) times daily. 11/01/16   Love, Ivan Anchors, PA-C  polyethylene glycol (MIRALAX / GLYCOLAX) packet Take 17 g by mouth daily.    [provider]  QUEtiapine (SEROQUEL) 50 MG tablet Take 25 mg by mouth 3 (three) times daily as needed.  04/06/17   [provider]  senna-docusate (SENOKOT-S) 8.6-50 MG tablet Take 2 tablets by mouth at bedtime. 10/24/19   Swayze, Ava, DO  sevelamer carbonate (RENVELA) 800 MG tablet Take 1,600 mg by mouth 3 (three) times daily with meals.    [provider]  traZODone (DESYREL) 50 MG tablet Take 50 mg by mouth at bedtime as needed for sleep.  04/04/18   [provider]   No Known Allergies  FAMILY HISTORY:  family history includes Breast cancer in her sister; Diabetes in her mother; Hypertension in her mother. SOCIAL HISTORY:  reports that she has never smoked. She has never used smokeless tobacco. She reports that she does not drink alcohol or use drugs.  REVIEW OF SYSTEMS: Positives in BOLD  Constitutional: Negative for fever,  chills, weight loss, malaise/fatigue and diaphoresis.  HENT: Negative for hearing loss, ear pain, nosebleeds, congestion, sore throat, neck pain, tinnitus and ear discharge.   Eyes: Negative for blurred vision, double vision, photophobia, pain, discharge and redness.  Respiratory: cough, hemoptysis, sputum production, shortness of breath, wheezing and stridor.   Cardiovascular: Negative for chest pain, palpitations, orthopnea, claudication, leg swelling and PND.  Gastrointestinal: heartburn, nausea, vomiting, abdominal pain, diarrhea, constipation, blood in stool and melena.  Genitourinary: Negative for dysuria, urgency, frequency, hematuria and flank pain.  Musculoskeletal: Negative for myalgias, back pain, joint pain and falls.  Skin: Negative for itching and rash.  Neurological: Negative for dizziness, tingling, tremors, sensory change, speech change, focal weakness, seizures, loss of consciousness, weakness and headaches.  Endo/Heme/Allergies: Negative for environmental allergies and polydipsia. Does not bruise/bleed easily.  SUBJECTIVE:  No complaints at this time   VITAL SIGNS: Temp:  [98.1 F (36.7 C)] 98.1 F (36.7 C) (04/30 0001) Pulse Rate:  [62-70] 66 (04/30 0155) Resp:  [14-18] 14 (04/30 0155) BP: (134-154)/(66-76) 134/66 (04/30 0130) SpO2:  [92 %-98 %] 92 % (04/30 0155) FiO2 (%):  [40 %] 40 % (04/30 0028) Weight:  [63.5 kg] 63.5 kg (04/30 0002)  PHYSICAL EXAMINATION: General: well developed, well nourished female, resting in bed NAD  Neuro: alert, follows commands, PERRLA HEENT: supple, no JVD  Cardiovascular: nsr, rrr, no R/G  Lungs: diminished with rhonchi throughout, even, non labored, chronic tracheostomy   Abdomen: +BS x4, soft, non tender, non distended  Musculoskeletal: normal bulk and tone, no edema  Skin: midline tracheostomy intact   Recent Labs  Lab 04/02/20 0003  NA 141  K 4.0  CL 100  CO2 27  BUN 81*  CREATININE 9.97*  GLUCOSE 166*   Recent  Labs  Lab 04/02/20 0003  HGB 9.6*  HCT 29.1*  WBC 8.4  PLT 260   DG Chest Port 1 View  Result Date: 04/02/2020 CLINICAL DATA:  Dyspnea EXAM: PORTABLE CHEST 1 VIEW COMPARISON:  October 23, 2019 FINDINGS: There is stable cardiomegaly. There is prominence of the pulmonary vasculature with increased interstitial markings/fluffy airspace opacities throughout both lungs. No focal airspace consolidation or pleural effusion. Overlying median sternotomy wires and tracheostomy tube is seen. Vascular stent is seen within the left proximal arm. IMPRESSION: Cardiomegaly and interstitial edema. Electronically Signed   By: Prudencio Pair M.D.   On: 04/02/2020 00:42    ASSESSMENT / PLAN:  Acute hypoxic respiratory failure secondary to pulmonary edema in the setting of ESRD and missed HD session  Supplemental O2 via chronic tracheostomy for hypoxia and/or tachypnea  Prn bronchodilator therapy  Nephrology consulted appreciate input-pt to undergo hemodialysis 04/30 Trend BMP  Replace electrolytes as indicated  Monitor UOP Avoid nephrotoxic medications  Trend WBC and fever curve  Will check PCT  Pt currently on azithromycin and ceftriaxone for possible CAP, if pct negative will consider discontinuing abx therapy  Trach care per protocol   HTN-stable  Hx: HLD and MI  Continuous telemetry monitoring  Will check troponin  Continue outpatient amiodarone, apixaban, atorvastatin, and midodrine  Type II Diabetes Mellitus  CBG q4hrs SSI   Marda Stalker, [place] Pager [phone] (please enter 7 digits) PCCM Consult Pager [phone] (please enter 7 digits)

## 2020-04-02 NOTE — Progress Notes (Signed)
   04/02/20 0953  Vital Signs  Pulse Rate 64  Pulse Rate Source Monitor  Resp 15  BP Location Right Arm  BP Method Automatic  Patient Position (if appropriate) Lying  Oxygen Therapy  SpO2 98 %  During Hemodialysis Assessment  Blood Flow Rate (mL/min) 150 mL/min  Arterial Pressure (mmHg) 10 mmHg  Venous Pressure (mmHg) 70 mmHg  Transmembrane Pressure (mmHg) 60 mmHg  Ultrafiltration Rate (mL/min) 1140 mL/min  Dialysate Flow Rate (mL/min) 600 ml/min  Conductivity: Machine  13.9  HD Safety Checks Performed Yes  Dialysis Fluid Bolus Normal Saline  Bolus Amount (mL) 250 mL  Intra-Hemodialysis Comments Tx initiated  Treatment iniitiated no s/s of distress to note. Goal to remove 3.5L of fluid on 2K bath

## 2020-04-02 NOTE — ED Notes (Signed)
Blood culture set #1 obtained from RIGHT hand by this tech.

## 2020-04-02 NOTE — ED Notes (Signed)
RT at bedside.

## 2020-04-02 NOTE — Progress Notes (Signed)
Goal lowered due to c/o cramping and drop in b/p

## 2020-04-02 NOTE — ED Notes (Signed)
Pt in bed. Pts trach has 10LPM of oxygen going to it that RT set up. Lights in room modified for comfort. Blood cultures obtained. Second set obtained from right [facility]. Limb alert placed on left arm. Pt placed on pure wick.

## 2020-04-02 NOTE — ED Notes (Signed)
Hand off of care and report given to ICU nurse.

## 2020-04-02 NOTE — Progress Notes (Signed)
   04/02/20 1330  Hand-Off documentation  Handoff Given Given to shift RN/LPN  Report given to (Full Name) Wilhelmenia Blase  Handoff Received Received from shift RN/LPN  Report received from (Full Name) Sherren Mocha RN  Vital Signs  Pulse Rate 76  Resp 18  BP (!) 149/65  BP Location Right Arm  BP Method Automatic  Patient Position (if appropriate) Lying  Oxygen Therapy  SpO2 97 %  Post-Hemodialysis Assessment  Rinseback Volume (mL) 250 mL  KECN 80.8 V  Dialyzer Clearance Lightly streaked  Duration of HD Treatment -hour(s) 3.5 hour(s)  Hemodialysis Intake (mL) 700 mL  UF Total -Machine (mL) 2724 mL  Net UF (mL) 2024 mL  Tolerated HD Treatment Yes  Post-Hemodialysis Comments unable to reach goal cramping no other problems  AVG/AVF Arterial Site Held (minutes) 10 minutes  AVG/AVF Venous Site Held (minutes) 10 minutes  Education / Care Plan  Dialysis Education Provided Yes  Note  Observations treatment completed  TREATMENT COMPLETED TOLERATED WELL UNABLE TO REACH GOAL DUE TO CRAMPING, CRAMPING RESOLVED PT RESTED COMFORTABLY 2L REMOVED

## 2020-04-02 NOTE — ED Triage Notes (Signed)
PT TO THE ER VIA [facility] FOR SOB. PT BECAME SOB AND THEN HAD A PANIC ATTACK, BECAME NAUSEATED AND VOMITED.  VOMIT WAS YELLOW SECRETIONS AND PT FEELS BETTER AFTER. 4 MG ZOFRAN GIVEN  IM. pT WAS ON OXYGEN IN THE PAST BUT NOT CURRENTLY.  SATS IN THE 80S FINGERS COLD. BP 184/60, HERT RATE IN 80S. PT MISSED DIALYSIS YESTERDAY , FISTULA ON THE LEFT.

## 2020-04-02 NOTE — ED Notes (Signed)
Pt transported to ICU with 2RNs. Pt on stretcher with monitor on. Pt taken up with non-rebreather at 15lpm to the trach.

## 2020-04-02 NOTE — Progress Notes (Signed)
[facility]  ROUNDING NOTE   Subjective:   [name] admitted to [facility] on 04/01/2020 for Acute respiratory failure [facility]) [J96.00]  Patient admitted to ICU. Placed on emergent hemodialysis treatment this morning. UF of 2 liters. Patient weaned off oxygen.     HEMODIALYSIS FLOWSHEET:  Blood Flow Rate (mL/min): 150 mL/min Arterial Pressure (mmHg): 50 mmHg Venous Pressure (mmHg): 60 mmHg Transmembrane Pressure (mmHg): 60 mmHg Ultrafiltration Rate (mL/min): 0 mL/min Dialysate Flow Rate (mL/min): 600 ml/min Conductivity: Machine : 13.8 Conductivity: Machine : 13.8 Dialysis Fluid Bolus: Normal Saline Bolus Amount (mL): 250 mL    Objective:  Vital signs in last 24 hours:  Temp:  [97.1 F (36.2 C)-98.4 F (36.9 C)] 97.1 F (36.2 C) (04/30 1325) Pulse Rate:  [61-94] 76 (04/30 1330) Resp:  [9-21] 18 (04/30 1330) BP: (96-154)/(54-116) 149/65 (04/30 1330) SpO2:  [90 %-100 %] 97 % (04/30 1330) FiO2 (%):  [35 %-40 %] 35 % (04/30 0431) Weight:  [63.5 kg-66.7 kg] 66.7 kg (04/30 0457)  Weight change:  Filed Weights   04/02/20 0002 04/02/20 0457  Weight: 63.5 kg 66.7 kg    Intake/Output: I/O last 3 completed shifts: In: 349.6 [IV Piggyback:349.6] Out: -    Intake/Output this shift:  Total I/O In: -  Out: 2024 [Other:2024]  Physical Exam: General: NAD,   Head: Normocephalic, atraumatic. Moist oral mucosal membranes  Eyes: Anicteric, PERRL  Neck: +tracheostomy  Lungs:  Bilateral crackles  Heart: Regular rate and rhythm  Abdomen:  Soft, nontender,   Extremities:  + peripheral edema.  Neurologic: Nonfocal, moving all four extremities  Skin: No lesions  Access: Left AVG    Basic Metabolic Panel: Recent Labs  Lab 04/02/20 0003 04/02/20 0338  NA 141 143  K 4.0 4.4  CL 100 101  CO2 27 28  GLUCOSE 166* 132*  BUN 81* 89*  CREATININE 9.97* 9.78*  CALCIUM 8.7* 8.5*  MG  --  2.0  PHOS  --  4.4  4.3    Liver Function Tests: Recent Labs  Lab  04/02/20 0338  ALBUMIN 3.9   No results for input(s): LIPASE, AMYLASE in the last 168 hours. No results for input(s): AMMONIA in the last 168 hours.  CBC: Recent Labs  Lab 04/02/20 0003 04/02/20 0338  WBC 8.4 9.6  NEUTROABS 6.0  --   HGB 9.6* 10.5*  HCT 29.1* 31.9*  MCV 91.2 91.4  PLT 260 275    Cardiac Enzymes: No results for input(s): CKTOTAL, CKMB, CKMBINDEX, TROPONINI in the last 168 hours.  BNP: Invalid input(s): POCBNP  CBG: Recent Labs  Lab 04/02/20 0321 04/02/20 0717 04/02/20 1152 04/02/20 1621  GLUCAP 124* 122* 91 105*    Microbiology: Results for orders placed or performed during the hospital encounter of 04/01/20  Blood Culture (routine x 2)     Status: None (Preliminary result)   Collection Time: 04/02/20 12:09 AM   Specimen: BLOOD RIGHT HAND  Result Value Ref Range Status   Specimen Description BLOOD RIGHT HAND  Final   Special Requests   Final    BOTTLES DRAWN AEROBIC AND ANAEROBIC Blood Culture adequate volume   Culture   Final    NO GROWTH < 12 HOURS Performed at [facility], [address] [place],  [address]    Report Status PENDING  Incomplete  Respiratory Panel by RT PCR (Flu A&B, Covid) - Nasopharyngeal Swab     Status: None   Collection Time: 04/02/20 12:09 AM   Specimen:  Nasopharyngeal Swab  Result Value Ref Range Status   SARS Coronavirus 2 by RT PCR NEGATIVE NEGATIVE Final    Comment: (NOTE) SARS-CoV-2 target nucleic acids are NOT DETECTED. The SARS-CoV-2 RNA is generally detectable in upper respiratoy specimens during the acute phase of infection. The lowest concentration of SARS-CoV-2 viral copies this assay can detect is 131 copies/mL. A negative result does not preclude SARS-Cov-2 infection and should not be used as the sole basis for treatment or other patient management decisions. A negative result may occur with  improper specimen collection/handling, submission of specimen other than nasopharyngeal  swab, presence of viral mutation(s) within the areas targeted by this assay, and inadequate number of viral copies (<131 copies/mL). A negative result must be combined with clinical observations, patient history, and epidemiological information. The expected result is Negative. Fact Sheet for Patients:  PinkCheek.be Fact Sheet for Healthcare Providers:  GravelBags.it This test is not yet ap proved or cleared by the [place] FDA and  has been authorized for detection and/or diagnosis of SARS-CoV-2 by FDA under an Emergency Use Authorization (EUA). This EUA will remain  in effect (meaning this test can be used) for the duration of the COVID-19 declaration under Section 564(b)(1) of the Act, 21 U.S.C. section 360bbb-3(b)(1), unless the authorization is terminated or revoked sooner.    Influenza A by PCR NEGATIVE NEGATIVE Final   Influenza B by PCR NEGATIVE NEGATIVE Final    Comment: (NOTE) The Xpert Xpress SARS-CoV-2/FLU/RSV assay is intended as an aid in  the diagnosis of influenza from Nasopharyngeal swab specimens and  should not be used as a sole basis for treatment. Nasal washings and  aspirates are unacceptable for Xpert Xpress SARS-CoV-2/FLU/RSV  testing. Fact Sheet for Patients: PinkCheek.be Fact Sheet for Healthcare Providers: GravelBags.it This test is not yet approved or cleared by the [place] FDA and  has been authorized for detection and/or diagnosis of SARS-CoV-2 by  FDA under an Emergency Use Authorization (EUA). This EUA will remain  in effect (meaning this test can be used) for the duration of the  Covid-19 declaration under Section 564(b)(1) of the Act, 21  U.S.C. section 360bbb-3(b)(1), unless the authorization is  terminated or revoked. Performed at [facility], [address], [place], [place] [address]   Blood Culture  (routine x 2)     Status: None (Preliminary result)   Collection Time: 04/02/20 12:14 AM   Specimen: Right Antecubital; Blood  Result Value Ref Range Status   Specimen Description RIGHT ANTECUBITAL  Final   Special Requests   Final    BOTTLES DRAWN AEROBIC AND ANAEROBIC Blood Culture adequate volume   Culture   Final    NO GROWTH < 12 HOURS Performed at [facility], [address] [place], [place] [address]    Report Status PENDING  Incomplete  MRSA PCR Screening     Status: None   Collection Time: 04/02/20  3:14 AM   Specimen: Nasopharyngeal  Result Value Ref Range Status   MRSA by PCR NEGATIVE NEGATIVE Final    Comment:        The GeneXpert MRSA Assay (FDA approved for NASAL specimens only), is one component of a comprehensive MRSA colonization surveillance program. It is not intended to diagnose MRSA infection nor to guide or monitor treatment for MRSA infections. Performed at [facility], [address] [place], [place] [address]     Coagulation Studies: No results for input(s): LABPROT, INR in the last 72 hours.  Urinalysis: No results for input(s): COLORURINE, LABSPEC, PHURINE, GLUCOSEU, HGBUR, BILIRUBINUR, KETONESUR, PROTEINUR, UROBILINOGEN, NITRITE, LEUKOCYTESUR in the last 72 hours.  Invalid input(s): APPERANCEUR    Imaging: DG Chest Port 1 View  Result Date: 04/02/2020 CLINICAL DATA:  Dyspnea EXAM: PORTABLE CHEST 1 VIEW COMPARISON:  October 23, 2019 FINDINGS: There is stable cardiomegaly. There is prominence of the pulmonary vasculature with increased interstitial markings/fluffy airspace opacities throughout both lungs. No focal airspace consolidation or pleural effusion. Overlying median sternotomy wires and tracheostomy tube is seen. Vascular stent is seen within the left proximal arm. IMPRESSION: Cardiomegaly and interstitial edema. Electronically Signed   By: Prudencio Pair M.D.   On: 04/02/2020 00:42     Medications:    .  amiodarone  200 mg Oral Daily  . apixaban  2.5 mg Oral BID  . aspirin EC  81 mg Oral Daily  . atorvastatin  80 mg Oral q1800  . Chlorhexidine Gluconate Cloth  6 each Topical Daily  . gabapentin  300 mg Oral TID  . insulin aspart  0-6 Units Subcutaneous Q4H  . melatonin  5 mg Oral QHS  . midodrine  5 mg Oral Once per day on Mon Wed Fri  . multivitamin  1 tablet Oral Daily  . pantoprazole  40 mg Oral BID  . polyethylene glycol  17 g Oral Daily   ALPRAZolam, docusate sodium, ipratropium-albuterol, lidocaine-prilocaine, ondansetron (ZOFRAN) IV, polyethylene glycol, traZODone  Assessment/ Plan:  Ms. DUSTYN ARMBRISTER is a 64 y.o. black female with with end stage renal disease on hemodialysis, tracheostomy, hypotension, diabetes mellitus type II insulin dependent, diabetic neuropathy,  coronary artery disease, mitral valve repair, atrial fibrillation (anticoagulation with apixaban) admitted to [facility] on 04/01/2020 for Acute respiratory failure [facility]) [J96.00]   [facility] Nephrology MWF [facility] left AVG Chronic tracheostomy  1. End Stage Renal Disease: with pulmonary edema - emergent dialysis this morning on admission.  - Continue MWF schedule. Monitor daily for dialysis need.  - seen during HD; tolerating well  2. Acute hypoxic Respiratory failure: requiring oxygen. Secondary to pulmonary edema.  With chronic tracheostomy  3.  Anemia of chronic kidney disease:  hemoglobin 10.5, holding EPO  4. Secondary Hyperparathyroidism: outpatient labs on 4/7 PTH 203, phos 4.3, and calcium 8.9.  - sevelamer with meals.   5. Hypotension - midodrine.    LOS: 0 Arlie Riker 4/30/20215:06 PM

## 2020-04-02 NOTE — TOC Initial Note (Addendum)
Transition of Care [facility]) - Initial/Assessment Note    Patient Details  Name: Deborah [name] MRN: [id number] Date of Birth: 1956/06/19  Transition of Care [facility]) CM/SW Contact:    Magnus Ivan, LCSW Phone Number: 04/02/2020, 11:11 AM  Clinical Narrative:           CSW met with patient at bedside for Readmission Screening. Patient reported she lives with her son [name]. PCP is Dr. Alyson Ingles through [facility]. Pharmacy is [facility] on [address] and patient denied issues with obtaining her medicines. Patient reported she uses [employer] for transportation. Patient reported she has [facility] services through [place], [place] left a voicemail with [name] to confirm this, waiting to hear back. Patient came in with a home trach. Patient reported she has trach supplies including suction and a nebulizer at home. Patient is a dialysis patient. CSW informed Dialysis Coordinator Estill Bamberg of patient being here. CSW encouraged patient to reach out with needs and will continue to follow.   2:15- Received update from [employer] who reported patient is not active with [facility], but is active with their Nursing Office due to her trach. Tommi Rumps reported he notified the staff at that office of patient being at the hospital.   Expected Discharge Plan: [place] Barriers to Discharge: Continued Medical Work up   Patient Goals and CMS Choice Patient states their goals for this hospitalization and ongoing recovery are:: to return home   Choice offered to / list presented to : Patient  Expected Discharge Plan and Services Expected Discharge Plan: [place]       Living arrangements for the past 2 months: [place]: [place] Date [place]: 04/02/20   Representative spoke with at [place]: Left a Advertising account executive for [employer]  Prior Living Arrangements/Services Living  arrangements for the past 2 months: Single Family Home Lives with:: Adult Children Patient language and need for interpreter reviewed:: Yes Do you feel safe going back to the place where you live?: Yes      Need for Family Participation in Patient Care: Yes (Comment) Care giver support system in place?: Yes (comment) Current home services: DME, Homehealth aide Criminal Activity/Legal Involvement Pertinent to Current Situation/Hospitalization: No - Comment as needed  Activities of Daily Living      Permission Sought/Granted Permission sought to share information with : Family Supports       Permission granted to share info w AGENCY: [facility]        Emotional Assessment Appearance:: Appears younger than stated age Attitude/Demeanor/Rapport: Engaged Affect (typically observed): Calm Orientation: : Oriented to Self, Oriented to Place, Oriented to  Time, Oriented to Situation Alcohol / Substance Use: Not Applicable Psych Involvement: No (comment)  Admission diagnosis:  Acute respiratory failure ([place]) [J96.00] Patient Active Problem List   Diagnosis Date Noted  . Acute respiratory failure ([place]) 04/02/2020  . Acute CHF (congestive heart failure) ([place]) 10/17/2019  . Diabetic neuropathy ([place]) 10/16/2017  . Tracheobronchitis 10/11/2017  . Hemorrhage from arteriovenous dialysis graft ([place]) 10/05/2017  . Sepsis ([place]) 09/08/2017  . Adjustment disorder with anxious mood 12/09/2016  . Bacteremia due to Staphylococcus aureus   . Staphylococcus aureus bacteremia with sepsis ([place])   . Uncontrolled type 2 diabetes mellitus with  complication ([place])   . ST elevation myocardial infarction involving left main coronary artery ([place])   . Palliative care encounter   . Advance care planning   . Vocal cord dysfunction   . Palliative care by specialist   . Goals of care, counseling/discussion   . Bacteremia   . End-stage renal disease on hemodialysis ([place])   . Paroxysmal atrial  fibrillation ([name])   . Heart attack ([place])   . Uncontrolled type 2 diabetes mellitus with complication, without long-term current use of insulin ([place])   . Staphylococcus aureus bacteremia 11/12/2016  . Vocal cord paralysis   . Respiratory distress 11/02/2016  . Chronic anticoagulation   . Acute lower UTI   . Labile blood glucose   . Leukocytosis   . Subtherapeutic international normalized ratio (INR)   . Supratherapeutic INR   . Cough   . Oropharyngeal dysphagia   . Uncontrolled type 2 diabetes mellitus with complication, with long-term current use of insulin ([place])   . Physical debility 10/11/2016  . Metabolic encephalopathy 69/79/4801  . Debility   . ESRD on dialysis ([place])   . Type 2 diabetes mellitus with complication, with long-term current use of insulin ([place])   . Anemia of chronic disease   . Coronary artery disease involving native coronary artery of native heart without angina pectoris   . Acute on chronic respiratory failure with hypoxemia ([place])   . Atrial flutter ([place])   . Persistent cough   . Dysphonia   . Sleep disturbance   . Diabetes mellitus type 2 in nonobese ([name])   . Stage 3 chronic kidney disease   . NSTEMI (non-ST elevated myocardial infarction) ([place])   . Tachypnea   . Hyponatremia   . Lymphocytosis   . Acute blood loss anemia   . Pressure injury of skin 09/18/2016  . History of tracheostomy   . Delirium   . Encounter for attention to tracheostomy ([place])   . History of MVR with cardiopulmonary bypass   . Systolic congestive heart failure ([place])   . HCAP (healthcare-associated pneumonia)   . PAF (paroxysmal atrial fibrillation) ([place])   . Acute systolic congestive heart failure ([place])   . AKI (acute kidney injury) ([name])   . Abnormal LFTs   . S/P MVR (mitral valve replacement)   . Mitral regurgitation 08/23/2016  . Acute on chronic diastolic heart failure ([place]) 08/18/2016  . Acute pulmonary edema ([name])   . Respiratory failure ([place]) 08/17/2016  .  Cardiogenic shock ([place]) 08/17/2016  . Acute hypoxemic respiratory failure ([place])   . STEMI (ST elevation myocardial infarction) ([place]) 08/15/2016  . Noncompliance 01/25/2015  . Bereavement 07/09/2014  . Low back pain 02/17/2014  . Cervical cancer screening 11/20/2013  . Routine general medical examination at a health care facility 10/14/2013  . Chronic kidney disease, stage 3 06/14/2012  . Hypertension 03/08/2012  . Diabetes mellitus type 2, controlled 03/08/2012  . Hyperlipidemia 03/08/2012   PCP:  System, Pcp Not In Pharmacy:   [facility]  [address] [place] [address] Phone: [phone] Fax: [phone]  [place], [place] - [address] [facility]. [address] [place] [address] Phone: [phone] Fax: [place] [address] [place] [address] Phone: [phone] Fax: [place] [address] [place] [address] [place] [address] Phone: [phone] Fax: [phone]  Social Determinants of Health (SDOH) Interventions    Readmission Risk Interventions Readmission Risk Prevention Plan 04/02/2020 10/18/2019  Transportation Screening Complete Complete  Medication Review Press photographer) Complete -  PCP or Specialist appointment within 3-5 days of discharge Complete Complete  HRI or [place] Complete Not Applicable  Some recent data might be hidden

## 2020-04-02 NOTE — Progress Notes (Signed)
CODE SEPSIS - PHARMACY COMMUNICATION  **Broad Spectrum Antibiotics should be administered within 1 hour of Sepsis diagnosis**  Time Code Sepsis Called/Page Received: 0010  Antibiotics Ordered: Rocephin and Zithromax  Time of 1st antibiotic administration: 0046  Additional action taken by pharmacy: n/a  If necessary, Name of Provider/Nurse Contacted: n/a   Ena Dawley ,PharmD Clinical Pharmacist  04/02/2020  12:54 AM

## 2020-04-02 NOTE — ED Notes (Signed)
Called pt son Terrial Rhodes and updated per pt request. Will update son again if any changes or room is assigned.

## 2020-04-02 NOTE — ED Notes (Signed)
Provider notified of [phone] note

## 2020-04-02 NOTE — ED Provider Notes (Signed)
[facility] Emergency Department Provider Note  ____________________________________________   First MD Initiated Contact with Patient 04/02/20 0002     (approximate)  I have reviewed the triage vital signs and the nursing notes.   HISTORY  Chief Complaint Shortness of Breath   HPI Deborah [name] is a 64 y.o. female with below list of previous medical conditions including end-stage renal disease on dialysis Monday Wednesday Friday.  Patient unfortunately missed dialysis yesterday.  Patient presents via EMS secondary to dyspnea hypoxia with oxygen saturation on arrival to emergency department of 69% on room air.  EMS states on their arrival to the patient's home she was satting 70%.  Patient also admits to cough that began today as well.  Patient denies any fever.        Past Medical History:  Diagnosis Date  . Diabetes mellitus   . Hyperlipidemia   . Hypertension   . MI, old   . Renal disorder    dialysls    Patient Active Problem List   Diagnosis Date Noted  . Acute respiratory failure ([place]) 04/02/2020  . Acute CHF (congestive heart failure) ([place]) 10/17/2019  . Diabetic neuropathy ([place]) 10/16/2017  . Tracheobronchitis 10/11/2017  . Hemorrhage from arteriovenous dialysis graft ([place]) 10/05/2017  . Sepsis ([place]) 09/08/2017  . Adjustment disorder with anxious mood 12/09/2016  . Bacteremia due to Staphylococcus aureus   . Staphylococcus aureus bacteremia with sepsis ([place])   . Uncontrolled type 2 diabetes mellitus with complication ([place])   . ST elevation myocardial infarction involving left main coronary artery ([place])   . Palliative care encounter   . Advance care planning   . Vocal cord dysfunction   . Palliative care by specialist   . Goals of care, counseling/discussion   . Bacteremia   . End-stage renal disease on hemodialysis ([place])   . Paroxysmal atrial fibrillation ([name])   . Heart attack ([place])   . Uncontrolled type 2 diabetes mellitus  with complication, without long-term current use of insulin ([place])   . Staphylococcus aureus bacteremia 11/12/2016  . Vocal cord paralysis   . Respiratory distress 11/02/2016  . Chronic anticoagulation   . Acute lower UTI   . Labile blood glucose   . Leukocytosis   . Subtherapeutic international normalized ratio (INR)   . Supratherapeutic INR   . Cough   . Oropharyngeal dysphagia   . Uncontrolled type 2 diabetes mellitus with complication, with long-term current use of insulin ([place])   . Physical debility 10/11/2016  . Metabolic encephalopathy 37/09/6268  . Debility   . ESRD on dialysis ([place])   . Type 2 diabetes mellitus with complication, with long-term current use of insulin ([place])   . Anemia of chronic disease   . Coronary artery disease involving native coronary artery of native heart without angina pectoris   . Acute on chronic respiratory failure with hypoxemia ([place])   . Atrial flutter ([place])   . Persistent cough   . Dysphonia   . Sleep disturbance   . Diabetes mellitus type 2 in nonobese ([name])   . Stage 3 chronic kidney disease   . NSTEMI (non-ST elevated myocardial infarction) ([place])   . Tachypnea   . Hyponatremia   . Lymphocytosis   . Acute blood loss anemia   . Pressure injury of skin 09/18/2016  . History of tracheostomy   . Delirium   . Encounter for attention to tracheostomy ([place])   . History of MVR with cardiopulmonary bypass   . Systolic  congestive heart failure ([place])   . HCAP (healthcare-associated pneumonia)   . PAF (paroxysmal atrial fibrillation) ([place])   . Acute systolic congestive heart failure ([place])   . AKI (acute kidney injury) ([place])   . Abnormal LFTs   . S/P MVR (mitral valve replacement)   . Mitral regurgitation 08/23/2016  . Acute on chronic diastolic heart failure ([place]) 08/18/2016  . Acute pulmonary edema ([name])   . Respiratory failure ([place]) 08/17/2016  . Cardiogenic shock ([place]) 08/17/2016  . Acute hypoxemic respiratory failure ([place])   . STEMI (ST  elevation myocardial infarction) ([place]) 08/15/2016  . Noncompliance 01/25/2015  . Bereavement 07/09/2014  . Low back pain 02/17/2014  . Cervical cancer screening 11/20/2013  . Routine general medical examination at a health care facility 10/14/2013  . Chronic kidney disease, stage 3 06/14/2012  . Hypertension 03/08/2012  . Diabetes mellitus type 2, controlled 03/08/2012  . Hyperlipidemia 03/08/2012    Past Surgical History:  Procedure Laterality Date  . A/V FISTULAGRAM N/A 10/05/2017   Procedure: A/V Fistulagram;  Surgeon: Katha Cabal, MD;  Location: [place] [facility];  Service: Cardiovascular;  Laterality: N/A;  . A/V SHUNTOGRAM N/A 10/05/2017   Procedure: A/V SHUNTOGRAM;  Surgeon: Katha Cabal, MD;  Location: [place] [facility];  Service: Cardiovascular;  Laterality: N/A;  . AV FISTULA PLACEMENT Left 10/02/2016   Procedure: INSERTION OF ARTERIOVENOUS (AV) GORE-TEX GRAFT ARM;  Surgeon: Waynetta Sandy, MD;  Location: [place];  Service: Vascular;  Laterality: Left;  . CARDIAC CATHETERIZATION N/A 08/15/2016   Procedure: Left Heart Cath and Coronary Angiography;  Surgeon: Yolonda Kida, MD;  Location: [place] [facility];  Service: Cardiovascular;  Laterality: N/A;  . CARDIAC CATHETERIZATION N/A 08/15/2016   Procedure: Coronary Stent Intervention;  Surgeon: Yolonda Kida, MD;  Location: [place] [facility];  Service: Cardiovascular;  Laterality: N/A;  . CARDIAC CATHETERIZATION N/A 08/18/2016   Procedure: Right Heart Cath;  Surgeon: Jolaine Artist, MD;  Location: [place] [facility];  Service: Cardiovascular;  Laterality: N/A;  . CARDIAC CATHETERIZATION N/A 08/18/2016   Procedure: IABP Insertion;  Surgeon: Jolaine Artist, MD;  Location: [place] [facility];  Service: Cardiovascular;  Laterality: N/A;  . CARDIAC CATHETERIZATION Right 08/23/2016   Procedure: CENTRAL LINE INSERTION RIGHT SUBCLAVIAN;  Surgeon: Ivin Poot, MD;  Location: [place];  Service:  Open Heart Surgery;  Laterality: Right;  . ENDOVEIN HARVEST OF GREATER SAPHENOUS VEIN Left 08/23/2016   Procedure: ENDOVEIN HARVEST OF GREATER SAPHENOUS VEIN;  Surgeon: Ivin Poot, MD;  Location: [place];  Service: Open Heart Surgery;  Laterality: Left;  . INSERTION OF DIALYSIS CATHETER Bilateral 08/31/2016   Procedure: INSERTION OF DIALYSIS CATHETER LEFT INTERNAL JUGULAR VEIN & INSERTION OF TRIPLE LUMEN RIGHT INTERNAL JUGULAR VEIN;  Surgeon: Angelia Mould, MD;  Location: [place];  Service: Vascular;  Laterality: Bilateral;  . INTRAOPERATIVE TRANSESOPHAGEAL ECHOCARDIOGRAM N/A 08/23/2016   Procedure: INTRAOPERATIVE TRANSESOPHAGEAL ECHOCARDIOGRAM;  Surgeon: Ivin Poot, MD;  Location: [place];  Service: Open Heart Surgery;  Laterality: N/A;  . IR GENERIC HISTORICAL  11/13/2016   IR US GUIDE VASC ACCESS LEFT 11/13/2016 Corrie Mckusick, DO [facility]  . IR GENERIC HISTORICAL  11/13/2016   IR FLUORO GUIDE CV LINE LEFT 11/13/2016 Corrie Mckusick, DO [facility]  . IR GENERIC HISTORICAL  11/13/2016   IR GASTROSTOMY TUBE MOD SED 11/13/2016 Corrie Mckusick, DO [facility]  . MITRAL VALVE REPAIR N/A 08/23/2016   Procedure: MITRAL VALVE REPAIR (  MVR) USING 25MM EDWARDS MAGNA EASE BIOPROSTHESIS MITRAL  VALVE;  Surgeon: Ivin Poot, MD;  Location: [place];  Service: Open Heart Surgery;  Laterality: N/A;  . tracheostomy reversal    . TRACHEOSTOMY TUBE PLACEMENT N/A 11/09/2016   Procedure: TRACHEOSTOMY;  Surgeon: Melissa Montane, MD;  Location: [place];  Service: ENT;  Laterality: N/A;  . VAGINAL DELIVERY     x 6    Prior to Admission medications   Medication Sig Start Date End Date Taking? Authorizing Provider  acetaminophen (TYLENOL) 325 MG tablet Take 650 mg by mouth. 04/06/17   [provider]  albuterol (PROVENTIL) (2.5 MG/3ML) 0.083% nebulizer solution Take 3 mLs (2.5 mg total) by nebulization 4 (four) times daily - after meals and at bedtime. 11/01/16   Love, Ivan Anchors, PA-C  ALPRAZolam (XANAX)  0.25 MG tablet Take 1 tablet (0.25 mg total) by mouth 2 (two) times daily as needed for anxiety. 10/24/19   Swayze, Ava, DO  amiodarone (PACERONE) 200 MG tablet Take 1 tablet (200 mg total) by mouth daily. 10/24/19   Swayze, Ava, DO  apixaban (ELIQUIS) 2.5 MG TABS tablet Take 2.5 mg by mouth 2 (two) times daily.  06/22/17   [provider]  aspirin 81 MG EC tablet Take 1 tablet (81 mg total) by mouth daily. 10/24/19   Swayze, Ava, DO  atorvastatin (LIPITOR) 80 MG tablet Take 80 mg by mouth daily at 6 PM.     [provider]  B Complex-C-Folic Acid (RENA-VITE RX) 1 MG TABS Take 1 tablet by mouth daily. 09/01/19   [provider]  Blood Glucose Monitoring Suppl (GLUCOCOM BLOOD GLUCOSE MONITOR) DEVI Use to check blood sugars 4-6 times per day Disp per insurance company kit ICD-10 E11.9 07/18/17   [provider]  carvedilol (COREG) 25 MG tablet Take 25 mg by mouth 2 (two) times daily. 04/22/18   [provider]  furosemide (LASIX) 40 MG tablet Take 40 mg by mouth daily. ON mornings of dialysis    [provider]  gabapentin (NEURONTIN) 300 MG capsule TAKE 1 CAPSULE BY MOUTH THREE TIMES DAILY 03/08/20   Edrick Kins, DPM  insulin glargine (LANTUS) 100 UNIT/ML injection Inject 14 Units into the skin at bedtime.    [provider]  lidocaine-prilocaine (EMLA) cream Apply 1 application topically every dialysis. 09/26/19   [provider]  Melatonin 3 MG TABS Take 3-6 mg by mouth at bedtime.  04/06/17   [provider]  midodrine (PROAMATINE) 5 MG tablet Take 5 mg by mouth 3 (three) times a week.    [provider]  NONFORMULARY OR COMPOUNDED ITEM See pharmacy note 07/20/17   Edrick Kins, DPM  pantoprazole (PROTONIX) 40 MG tablet Take 1 tablet (40 mg total) by mouth 2 (two) times daily. 11/01/16   Love, Ivan Anchors, PA-C  polyethylene glycol (MIRALAX / GLYCOLAX) packet Take 17 g by mouth daily.    [provider]    QUEtiapine (SEROQUEL) 50 MG tablet Take 25 mg by mouth 3 (three) times daily as needed.  04/06/17   [provider]  senna-docusate (SENOKOT-S) 8.6-50 MG tablet Take 2 tablets by mouth at bedtime. 10/24/19   Swayze, Ava, DO  sevelamer carbonate (RENVELA) 800 MG tablet Take 1,600 mg by mouth 3 (three) times daily with meals.    [provider]  traZODone (DESYREL) 50 MG tablet Take 50 mg by mouth at bedtime as needed for sleep.  04/04/18   [provider]    Allergies Patient has no known allergies.  Family History  Problem Relation Age of Onset  . Hypertension Mother   . Diabetes Mother   . Breast cancer Sister     Social History Social History   Tobacco Use  . Smoking status: Never Smoker  . Smokeless tobacco: Never Used  Substance Use Topics  . Alcohol use: No  . Drug use: No    Review of Systems Constitutional: No fever/chills Eyes: No visual changes. ENT: No sore throat. Cardiovascular: Denies chest pain. Respiratory: Positive for cough and dyspnea Gastrointestinal: No abdominal pain.  No nausea, no vomiting.  No diarrhea.  No constipation. Genitourinary: Negative for dysuria. Musculoskeletal: Negative for neck pain.  Negative for back pain. Integumentary: Negative for rash. Neurological: Negative for headaches, focal weakness or numbness.  ____________________________________________   PHYSICAL EXAM:  VITAL SIGNS: ED Triage Vitals  Enc Vitals Group     BP --      Pulse Rate 04/02/20 0001 68     Resp --      Temp --      Temp src --      SpO2 04/02/20 0001 97 %     Weight 04/02/20 0002 63.5 kg (140 lb)     Height 04/02/20 0002 1.575 m (5' 2" )     Head Circumference --      Peak Flow --      Pain Score 04/02/20 0002 0     Pain Loc --      Pain Edu? --      Excl. in [place]? --     Constitutional: Alert and oriented.  Apparent respiratory difficulty Eyes: Conjunctivae are normal.  Mouth/Throat: Patient is wearing a mask. Neck: No  stridor.  No meningeal signs.   Cardiovascular: Normal rate, regular rhythm. Good peripheral circulation. Grossly normal heart sounds. Respiratory: Tachypnea, positive accessory respiratory muscle use, bibasilar rhonchi Gastrointestinal: Soft and nontender. No distention. :  Musculoskeletal: No lower extremity tenderness nor edema. No gross deformities of extremities. Neurologic:  Normal speech and language. No gross focal neurologic deficits are appreciated.  Skin:  Skin is warm, dry and intact. Psychiatric: Mood and affect are normal. Speech and behavior are normal.  ____________________________________________   LABS (all labs ordered are listed, but only abnormal results are displayed)  Labs Reviewed  CBC WITH DIFFERENTIAL/PLATELET - Abnormal; Notable for the following components:      Result Value   RBC 3.19 (*)    Hemoglobin 9.6 (*)    HCT 29.1 (*)    RDW 17.4 (*)    All other components within normal limits  BASIC METABOLIC PANEL - Abnormal; Notable for the following components:   Glucose, Bld 166 (*)    BUN 81 (*)    Creatinine, Ser 9.97 (*)    Calcium 8.7 (*)    GFR calc non Af Amer 4 (*)    GFR calc Af Amer 4 (*)    All other components within normal limits  BRAIN NATRIURETIC PEPTIDE - Abnormal; Notable for the following components:   B Natriuretic Peptide 959.0 (*)    All other components within normal limits  APTT - Abnormal; Notable for the following components:   aPTT 38 (*)    All other components within normal limits  BLOOD GAS, VENOUS - Abnormal; Notable for the following components:   Bicarbonate 30.5 (*)    Acid-Base Excess 5.1 (*)    All other components within normal limits  GLUCOSE, CAPILLARY -  Abnormal; Notable for the following components:   Glucose-Capillary 124 (*)    All other components within normal limits  RESPIRATORY PANEL BY RT PCR (FLU A&B, COVID)  MRSA PCR SCREENING  CULTURE, BLOOD (ROUTINE X 2)  CULTURE, BLOOD (ROUTINE X 2)  LACTIC  ACID, PLASMA  LACTIC ACID, PLASMA  PROCALCITONIN  MAGNESIUM  PHOSPHORUS  HIV ANTIBODY (ROUTINE TESTING W REFLEX)  HEMOGLOBIN A1C  TROPONIN I (HIGH SENSITIVITY)   ____________________________________________  EKG  ED ECG REPORT I, Reid N Sharonda Llamas, the attending physician, personally viewed and interpreted this ECG.   Date: 04/02/2020  EKG Time: 12:04 AM  Rate: 67  Rhythm: Normal sinus rhythm  Axis: Normal  Intervals: Normal  ST&T Change: None  ____________________________________________  RADIOLOGY I, [place] N Glynda Soliday, personally viewed and evaluated these images (plain radiographs) as part of my medical decision making, as well as reviewing the written report by the radiologist.  ED MD interpretation: Cardiomegaly with interstitial edema.  Official radiology report(s): DG Chest Port 1 View  Result Date: 04/02/2020 CLINICAL DATA:  Dyspnea EXAM: PORTABLE CHEST 1 VIEW COMPARISON:  October 23, 2019 FINDINGS: There is stable cardiomegaly. There is prominence of the pulmonary vasculature with increased interstitial markings/fluffy airspace opacities throughout both lungs. No focal airspace consolidation or pleural effusion. Overlying median sternotomy wires and tracheostomy tube is seen. Vascular stent is seen within the left proximal arm. IMPRESSION: Cardiomegaly and interstitial edema. Electronically Signed   By: Prudencio Pair M.D.   On: 04/02/2020 00:42      .Critical Care Performed by: Gregor Hams, MD Authorized by: Gregor Hams, MD   Critical care provider statement:    Critical care time (minutes):  30   Critical care time was exclusive of:  Separately billable procedures and treating other patients   Critical care was necessary to treat or prevent imminent or life-threatening deterioration of the following conditions:  Respiratory failure   Critical care was time spent personally by me on the following activities:  Development of treatment plan with patient  or surrogate, discussions with consultants, evaluation of patient's response to treatment, examination of patient, obtaining history from patient or surrogate, ordering and performing treatments and interventions, ordering and review of laboratory studies, ordering and review of radiographic studies, pulse oximetry, re-evaluation of patient's condition and review of old charts     ____________________________________________   INITIAL IMPRESSION / MDM / [place] / ED COURSE  As part of my medical decision making, I reviewed the following data within the electronic MEDICAL RECORD NUMBER   64 year old female presented with above-stated history and physical exam differential diagnosis including but not limited to acute on chronic respiratory failure possibly due to pulmonary edema given missed dialysis treatment.  Also considered possibility of pneumonia pulmonary emboli.  Chest x-ray findings consistent with interstitial edema.  Oxygen via trach collar was applied with resultant oxygen saturation 95% at present.  Patient also in Lasix 80 mg IV.  Following beforementioned intervention patient's respiratory status markedly improved.  Patient discussed with Dr. Juleen China nephrology with plan to dialyze the patient's later on this morning.  In addition patient discussed with Hinton Dyer NP ICU will admit the patient for further evaluation and management. ____________________________________________  FINAL CLINICAL IMPRESSION(S) / ED DIAGNOSES  Final diagnoses:  Acute respiratory failure with hypoxia ([name])  Acute pulmonary edema ([name])     MEDICATIONS GIVEN DURING THIS VISIT:  Medications  cefTRIAXone (ROCEPHIN) 2 g in sodium chloride 0.9 % 100 mL IVPB (0 g Intravenous  Stopped 04/02/20 0135)  azithromycin (ZITHROMAX) 500 mg in sodium chloride 0.9 % 250 mL IVPB (500 mg Intravenous New Bag/Given 04/02/20 0138)  docusate sodium (COLACE) capsule 100 mg (has no administration in time range)  polyethylene  glycol (MIRALAX / GLYCOLAX) packet 17 g (has no administration in time range)  ondansetron (ZOFRAN) injection 4 mg (has no administration in time range)  ipratropium-albuterol (DUONEB) 0.5-2.5 (3) MG/3ML nebulizer solution 3 mL (has no administration in time range)  Chlorhexidine Gluconate Cloth 2 % PADS 6 each (has no administration in time range)  amiodarone (PACERONE) tablet 200 mg (has no administration in time range)  aspirin EC tablet 81 mg (has no administration in time range)  apixaban (ELIQUIS) tablet 2.5 mg (has no administration in time range)  atorvastatin (LIPITOR) tablet 80 mg (has no administration in time range)  multivitamin (RENA-VIT) tablet 1 tablet (has no administration in time range)  gabapentin (NEURONTIN) capsule 300 mg (has no administration in time range)  lidocaine-prilocaine (EMLA) cream 1 application (has no administration in time range)  melatonin tablet 5 mg (has no administration in time range)  midodrine (PROAMATINE) tablet 5 mg (has no administration in time range)  pantoprazole (PROTONIX) EC tablet 40 mg (has no administration in time range)  polyethylene glycol (MIRALAX / GLYCOLAX) packet 17 g (has no administration in time range)  traZODone (DESYREL) tablet 50 mg (has no administration in time range)  insulin aspart (novoLOG) injection 0-6 Units (0 Units Subcutaneous Not Given 04/02/20 0335)  furosemide (LASIX) injection 80 mg (80 mg Intravenous Given 04/02/20 0037)  ondansetron (ZOFRAN) injection 4 mg (4 mg Intravenous Given 04/02/20 0154)     ED Discharge Orders    None      *Please note:  MEKLIT COTTA was evaluated in Emergency Department on 04/02/2020 for the symptoms described in the history of present illness. She was evaluated in the context of the global COVID-19 pandemic, which necessitated consideration that the patient might be at risk for infection with the SARS-CoV-2 virus that causes COVID-19. Institutional protocols and algorithms that  pertain to the evaluation of patients at risk for COVID-19 are in a state of rapid change based on information released by regulatory bodies including the CDC and federal and state organizations. These policies and algorithms were followed during the patient's care in the ED.  Some ED evaluations and interventions may be delayed as a result of limited staffing during the pandemic.*  Note:  This document was prepared using Dragon voice recognition software and may include unintentional dictation errors.   Gregor Hams, MD 04/02/20 [phone]

## 2020-04-02 NOTE — ED Notes (Signed)
Pt had episode of bradycardia and heart rate dropped into the 40s for less than 2 minutes. Heart rate then went back to the 60s. Pt then endorsed nausea and had several episodes of mucous like emesis. RT notified, as pt states she feels like there is some build up on with her trach. RT states they will come down. ER provider to be notified.

## 2020-04-02 NOTE — Progress Notes (Signed)
Established hemodialysis patient known at [facility] MWF 10:40, patient transports with [facility]. Please contact me with any dialysis placement concerns.

## 2020-04-02 NOTE — Progress Notes (Signed)
uf off 200 bolus given patient c/o cramping 2L removed so far

## 2020-04-02 NOTE — ED Notes (Signed)
Respiratory in with pt to place pt on oxygen via trach mask.

## 2020-04-03 LAB — RENAL FUNCTION PANEL
Albumin: 3.3 g/dL — ABNORMAL LOW (ref 3.5–5.0)
Anion gap: 10 (ref 5–15)
BUN: 40 mg/dL — ABNORMAL HIGH (ref 8–23)
CO2: 29 mmol/L (ref 22–32)
Calcium: 8.1 mg/dL — ABNORMAL LOW (ref 8.9–10.3)
Chloride: 97 mmol/L — ABNORMAL LOW (ref 98–111)
Creatinine, Ser: 6.18 mg/dL — ABNORMAL HIGH (ref 0.44–1.00)
GFR calc Af Amer: 8 mL/min — ABNORMAL LOW (ref >60)
GFR calc non Af Amer: 7 mL/min — ABNORMAL LOW (ref >60)
Glucose, Bld: 163 mg/dL — ABNORMAL HIGH (ref 70–99)
Phosphorus: 4.9 mg/dL — ABNORMAL HIGH (ref 2.5–4.6)
Potassium: 3.9 mmol/L (ref 3.5–5.1)
Sodium: 136 mmol/L (ref 135–145)

## 2020-04-03 LAB — CBC
HCT: 26.4 % — ABNORMAL LOW (ref 36.0–46.0)
Hemoglobin: 8.5 g/dL — ABNORMAL LOW (ref 12.0–15.0)
MCH: 30 pg (ref 26.0–34.0)
MCHC: 32.2 g/dL (ref 30.0–36.0)
MCV: 93.3 fL (ref 80.0–100.0)
Platelets: 225 10*3/uL (ref 150–400)
RBC: 2.83 MIL/uL — ABNORMAL LOW (ref 3.87–5.11)
RDW: 17 % — ABNORMAL HIGH (ref 11.5–15.5)
WBC: 8.1 10*3/uL (ref 4.0–10.5)
nRBC: 0 % (ref 0.0–0.2)

## 2020-04-03 LAB — BRAIN NATRIURETIC PEPTIDE: B Natriuretic Peptide: 598 pg/mL — ABNORMAL HIGH (ref 0.0–100.0)

## 2020-04-03 LAB — GLUCOSE, CAPILLARY
Glucose-Capillary: 125 mg/dL — ABNORMAL HIGH (ref 70–99)
Glucose-Capillary: 114 mg/dL — ABNORMAL HIGH (ref 70–99)
Glucose-Capillary: 212 mg/dL — ABNORMAL HIGH (ref 70–99)

## 2020-04-03 MED ORDER — INSULIN ASPART 100 UNIT/ML ~~LOC~~ SOLN
0.0000 [IU] | Freq: Every day | SUBCUTANEOUS | Status: DC
  Administered 2020-04-03: 2 [IU] via SUBCUTANEOUS
  Filled 2020-04-03: qty 1

## 2020-04-03 MED ORDER — INSULIN ASPART 100 UNIT/ML ~~LOC~~ SOLN
0.0000 [IU] | Freq: Three times a day (TID) | SUBCUTANEOUS | Status: DC
  Administered 2020-04-04: 1 [IU] via SUBCUTANEOUS
  Filled 2020-04-03: qty 1

## 2020-04-03 MED ORDER — TORSEMIDE 20 MG PO TABS
40.0000 mg | ORAL_TABLET | Freq: Every day | ORAL | Status: DC
  Administered 2020-04-03 – 2020-04-04 (×2): 40 mg via ORAL
  Filled 2020-04-03 (×2): qty 2

## 2020-04-03 NOTE — Progress Notes (Signed)
Hd complete

## 2020-04-03 NOTE — Progress Notes (Signed)
This note also relates to the following rows which could not be included: Pulse Rate - Cannot attach notes to unvalidated device data Resp - Cannot attach notes to unvalidated device data SpO2 - Cannot attach notes to unvalidated device data  Hd started  

## 2020-04-03 NOTE — Progress Notes (Signed)
Patient transferred to HD 

## 2020-04-03 NOTE — Progress Notes (Signed)
Patient placed on 28% Venturi setup through trach collar for transfer to 204. ATC set up taken to 204 and setup at 5L/28% for patient once she is moved to room.

## 2020-04-03 NOTE — Progress Notes (Signed)
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 She initially required 40% trach collar. Patient is s/p renal replacement therapy with improvement in her respiratory failure and she has been weaned down to 28% FIO2 via trach collar

## 2020-04-03 NOTE — Progress Notes (Signed)
Patient returned from dialysis. Oxygen hooked up by respiratory. VSS

## 2020-04-03 NOTE — Progress Notes (Signed)
Patient transferred to [facility] by this RN. Patient bed placed in lowest position, call light within reach, and patient appears in no distress.    Patient trach collar placed in correct position with 5L, 28% O2.  O2 saturation is currently 100%.  External catheter in place and connected to suction, and spare trach setup on counter next to sink.    This RN gave verbal update to [place], RN and left patient.

## 2020-04-03 NOTE — Progress Notes (Signed)
Name: Deborah [name] MRN: [id number] DOB: November 07, 1956    ADMISSION DATE:  04/01/2020 CONSULTATION DATE: 04/02/2020  REFERRING MD : Dr. 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  She presented to [facility] ER on 04/30 via EMS with complaint of shortness of breath, panic attack, and nausea/vomting.  Pt does have a chronic tracheostomy, and at baseline she is on RA. In the ER pt hypoxic with O2 sats in the mid 80's to low 90's, therefore she was placed on 40% trach collar. CXR revealed cardiomegaly and interstitial edema.  Lab results revealed glucose 166, BUN 81, creatinine 9.97, BNP 959, hgb 9.6, lactic acid 1.5, and COVID-19/Influeza PCR negative.  Pt did miss her scheduled HD session on 04/29.  In there ER pt received 80 mg of iv lasix, ceftriaxone, and azithromycin. Nephrology consulted and pt scheduled for hemodialysis on 04/20.  ER physician contacted hospitalist team for hospital  Admission.  However, due to chronic tracheostomy hospitalist team requested pt to be admitted to ICU.  ER physician contacted PCCM team for admission, however pt did not meet ICU criteria.  Therefore, PCCM team admitted pt to the stepdown unit.   PAST MEDICAL HISTORY :   has a past medical history of Diabetes mellitus,  Hyperlipidemia, Hypertension, MI, old, and Renal disorder.  has a past surgical history that includes Vaginal delivery; Cardiac catheterization (N/A, 08/15/2016); Cardiac catheterization (N/A, 08/15/2016); Cardiac catheterization (N/A, 08/18/2016); Cardiac catheterization (N/A, 08/18/2016); Mitral valve repair (N/A, 08/23/2016); Intraoprative transesophageal echocardiogram (N/A, 08/23/2016); Endoharvest vein of greater saphenous vein (Left, 08/23/2016); Cardiac catheterization (Right, 08/23/2016); Insertion of dialysis catheter (Bilateral, 08/31/2016); AV fistula placement (Left, 10/02/2016); tracheostomy reversal; Tracheostomy tube placement (N/A, 11/09/2016); ir generic historical (11/13/2016); ir generic historical (11/13/2016); ir generic historical (11/13/2016); A/V SHUNTOGRAM (N/A, 10/05/2017); and A/V Fistulagram (N/A, 10/05/2017). Prior to Admission medications   Medication Sig Start Date End Date Taking? Authorizing Provider  acetaminophen (TYLENOL) 325 MG tablet Take 650 mg by mouth. 04/06/17   [provider]  albuterol (PROVENTIL) (2.5 MG/3ML) 0.083% nebulizer solution Take 3 mLs (2.5 mg total) by nebulization 4 (four) times daily - after meals and at bedtime. 11/01/16   Love, Ivan Anchors, PA-C  ALPRAZolam (XANAX) 0.25 MG tablet Take 1 tablet (0.25 mg total) by mouth 2 (two) times daily as needed for anxiety. 10/24/19   Swayze, Ava, DO  amiodarone (PACERONE) 200 MG tablet Take 1 tablet (200 mg total) by mouth daily. 10/24/19   Swayze, Ava, DO  apixaban (ELIQUIS) 2.5 MG TABS tablet Take 2.5 mg by mouth 2 (two) times daily.  06/22/17   [provider]  aspirin 81 MG EC tablet Take 1 tablet (81 mg total) by mouth daily. 10/24/19   Swayze, Ava, DO  atorvastatin (LIPITOR) 80 MG tablet Take 80 mg by mouth daily at 6  PM.     [provider]  B Complex-C-Folic Acid (RENA-VITE RX) 1 MG TABS Take 1 tablet by mouth daily. 09/01/19   [provider]  Blood Glucose Monitoring Suppl  (GLUCOCOM BLOOD GLUCOSE MONITOR) DEVI Use to check blood sugars 4-6 times per day Disp per insurance company kit ICD-10 E11.9 07/18/17   [provider]  carvedilol (COREG) 25 MG tablet Take 25 mg by mouth 2 (two) times daily. 04/22/18   [provider]  furosemide (LASIX) 40 MG tablet Take 40 mg by mouth daily. ON mornings of dialysis    [provider]  gabapentin (NEURONTIN) 300 MG capsule TAKE 1 CAPSULE BY MOUTH THREE TIMES DAILY 03/08/20   Edrick Kins, DPM  insulin glargine (LANTUS) 100 UNIT/ML injection Inject 14 Units into the skin at bedtime.    [provider]  lidocaine-prilocaine (EMLA) cream Apply 1 application topically every dialysis. 09/26/19   [provider]  Melatonin 3 MG TABS Take 3-6 mg by mouth at bedtime.  04/06/17   [provider]  midodrine (PROAMATINE) 5 MG tablet Take 5 mg by mouth 3 (three) times a week.    [provider]  NONFORMULARY OR COMPOUNDED ITEM See pharmacy note 07/20/17   Edrick Kins, DPM  pantoprazole (PROTONIX) 40 MG tablet Take 1 tablet (40 mg total) by mouth 2 (two) times daily. 11/01/16   Love, Ivan Anchors, PA-C  polyethylene glycol (MIRALAX / GLYCOLAX) packet Take 17 g by mouth daily.    [provider]  QUEtiapine (SEROQUEL) 50 MG tablet Take 25 mg by mouth 3 (three) times daily as needed.  04/06/17   [provider]  senna-docusate (SENOKOT-S) 8.6-50 MG tablet Take 2 tablets by mouth at bedtime. 10/24/19   Swayze, Ava, DO  sevelamer carbonate (RENVELA) 800 MG tablet Take 1,600 mg by mouth 3 (three) times daily with meals.    [provider]  traZODone (DESYREL) 50 MG tablet Take 50 mg by mouth at bedtime as needed for sleep.  04/04/18   [provider]   No Known Allergies  FAMILY HISTORY:  family history includes Breast cancer in her sister; Diabetes in her mother; Hypertension in her mother. SOCIAL HISTORY:  reports that she has never smoked. She has never  used smokeless tobacco. She reports that she does not drink alcohol or use drugs.  REVIEW OF SYSTEMS: Positives in BOLD  Constitutional: Negative for fever, chills, weight loss, malaise/fatigue and diaphoresis.  HENT: Negative for hearing loss, ear pain, nosebleeds, congestion, sore throat, neck pain, tinnitus and ear discharge.   Eyes: Negative for blurred vision, double vision, photophobia, pain, discharge and redness.  Respiratory: cough, hemoptysis, sputum production, shortness of breath, wheezing and stridor.   Cardiovascular: Negative for chest pain, palpitations, orthopnea, claudication, leg swelling and PND.  Gastrointestinal: heartburn, nausea, vomiting, abdominal pain, diarrhea, constipation, blood in stool and melena.  Genitourinary: Negative for dysuria, urgency, frequency, hematuria and flank pain.  Musculoskeletal: Negative for myalgias, back pain, joint pain and falls.  Skin: Negative for itching and rash.  Neurological: Negative for dizziness, tingling, tremors, sensory change, speech change, focal weakness, seizures, loss of consciousness, weakness and headaches.  Endo/Heme/Allergies: Negative for environmental allergies and polydipsia. Does not bruise/bleed easily.  SUBJECTIVE:  No complaints at this time   VITAL SIGNS: Temp:  [97.1 F (36.2 C)-98.5 F (36.9 C)] 98.5 F (36.9 C) (04/30 1912) Pulse Rate:  [62-94] 76 (04/30 1330) Resp:  [9-21] 18 (04/30 1330) BP: (103-150)/(54-116) 149/65 (  04/30 1330) SpO2:  [91 %-100 %] 97 % (04/30 1330) Weight:  [67.3 kg] 67.3 kg (05/01 0500)  PHYSICAL EXAMINATION: General: well developed, well nourished female, resting in bed NAD  Neuro: alert, follows commands, PERRLA HEENT: supple, no JVD  Cardiovascular: nsr, rrr, no R/G  Lungs: diminished with rhonchi throughout, even, non labored, chronic tracheostomy   Abdomen: +BS x4, soft, non tender, non distended  Musculoskeletal: normal bulk and tone, no edema  Skin: midline  tracheostomy intact   Recent Labs  Lab 04/02/20 0003 04/02/20 0338 04/03/20 0404  NA 141 143 136  K 4.0 4.4 3.9  CL 100 101 97*  CO2 27 28 29   BUN 81* 89* 40*  CREATININE 9.97* 9.78* 6.18*  GLUCOSE 166* 132* 163*   Recent Labs  Lab 04/02/20 0003 04/02/20 0338 04/03/20 0404  HGB 9.6* 10.5* 8.5*  HCT 29.1* 31.9* 26.4*  WBC 8.4 9.6 8.1  PLT 260 275 225   DG Chest Port 1 View  Result Date: 04/02/2020 CLINICAL DATA:  Dyspnea EXAM: PORTABLE CHEST 1 VIEW COMPARISON:  October 23, 2019 FINDINGS: There is stable cardiomegaly. There is prominence of the pulmonary vasculature with increased interstitial markings/fluffy airspace opacities throughout both lungs. No focal airspace consolidation or pleural effusion. Overlying median sternotomy wires and tracheostomy tube is seen. Vascular stent is seen within the left proximal arm. IMPRESSION: Cardiomegaly and interstitial edema. Electronically Signed   By: Prudencio Pair M.D.   On: 04/02/2020 00:42    ASSESSMENT / PLAN:  Acute hypoxic respiratory failure secondary to pulmonary edema in the setting of ESRD and missed HD session  Supplemental O2 via chronic tracheostomy for hypoxia and/or tachypnea  Prn bronchodilator therapy  Nephrology consulted appreciate input-pt to undergo hemodialysis 04/30 Trend BMP  Replace electrolytes as indicated  Monitor UOP Avoid nephrotoxic medications  Trend WBC and fever curve  Will check PCT  Pt currently on azithromycin and ceftriaxone for possible CAP, if pct negative will consider discontinuing abx therapy  Trach care per protocol   HTN-stable  Hx: HLD and MI  Continuous telemetry monitoring  Will check troponin  Continue outpatient amiodarone, apixaban, atorvastatin, and midodrine  Type II Diabetes Mellitus  CBG q4hrs SSI   Critical care provider statement:    Critical care time (minutes):  32   Critical care time was exclusive of:  Separately billable procedures and  treating other  patients   Critical care was necessary to treat or prevent imminent or  life-threatening deterioration of the following conditions:  Acute hypoxemic respiratory failure, pulmonary edema, multiple comorbid conditions   Critical care was time spent personally by me on the following  activities:  Development of treatment plan with patient or surrogate,  discussions with consultants, evaluation of patient's response to  treatment, examination of patient, obtaining history from patient or  surrogate, ordering and performing treatments and interventions, ordering  and review of laboratory studies and re-evaluation of patient's condition   I assumed direction of critical care for this patient from another  provider in my specialty: no     Ottie Glazier, M.D.  Pulmonary & [place]

## 2020-04-03 NOTE — Progress Notes (Signed)
[facility]  ROUNDING NOTE   Subjective:   [name] admitted to [facility] on 04/01/2020 for Acute respiratory failure ([place]) [J96.00]   Patient underwent hemodialysis yesterday and 2 L were removed Today she is on 5 L oxygen supplementation Normally she does not wear oxygen at home She is able to eat without any problem She is also able to talk after covering her tracheostomy She is able to talk on the phone without any problem this morning   Objective:  Vital signs in last 24 hours:  Temp:  [97.1 F (36.2 C)-98.5 F (36.9 C)] 98.5 F (36.9 C) (04/30 1912) Pulse Rate:  [64-94] 76 (04/30 1330) Resp:  [15-21] 18 (04/30 1330) BP: (103-149)/(54-79) 149/65 (04/30 1330) SpO2:  [91 %-98 %] 94 % (05/01 0800) Weight:  [67.3 kg] 67.3 kg (05/01 0500)  Weight change: 3.796 kg Filed Weights   04/02/20 0002 04/02/20 0457 04/03/20 0500  Weight: 63.5 kg 66.7 kg 67.3 kg    Intake/Output: I/O last 3 completed shifts: In: 349.6 [IV Piggyback:349.6] Out: 2174 [Urine:150; Other:2024]   Intake/Output this shift:  No intake/output data recorded.  Physical Exam: General: NAD,   Head: Normocephalic, atraumatic. Moist oral mucosal membranes  Eyes: Anicteric,  Neck: +tracheostomy  Lungs:  Bilateral mild basilar crackles  Heart: Regular rate and rhythm  Abdomen:  Soft, nontender,   Extremities:  + peripheral edema.  Neurologic:  Alert, oriented  Skin: No lesions  Access: Left AVG    Basic Metabolic Panel: Recent Labs  Lab 04/02/20 0003 04/02/20 0338 04/03/20 0404  NA 141 143 136  K 4.0 4.4 3.9  CL 100 101 97*  CO2 27 28 29   GLUCOSE 166* 132* 163*  BUN 81* 89* 40*  CREATININE 9.97* 9.78* 6.18*  CALCIUM 8.7* 8.5* 8.1*  MG  --  2.0  --   PHOS  --  4.4  4.3 4.9*    Liver Function Tests: Recent Labs  Lab 04/02/20 0338 04/03/20 0404  ALBUMIN 3.9 3.3*   No results for input(s): LIPASE, AMYLASE in the last 168 hours. No results for input(s): AMMONIA in  the last 168 hours.  CBC: Recent Labs  Lab 04/02/20 0003 04/02/20 0338 04/03/20 0404  WBC 8.4 9.6 8.1  NEUTROABS 6.0  --   --   HGB 9.6* 10.5* 8.5*  HCT 29.1* 31.9* 26.4*  MCV 91.2 91.4 93.3  PLT 260 275 225    Cardiac Enzymes: No results for input(s): CKTOTAL, CKMB, CKMBINDEX, TROPONINI in the last 168 hours.  BNP: Invalid input(s): POCBNP  CBG: Recent Labs  Lab 04/02/20 1152 04/02/20 1621 04/02/20 2033 04/02/20 2335 04/03/20 0756  GLUCAP 91 105* 179* 162* 125*    Microbiology: Results for orders placed or performed during the hospital encounter of 04/01/20  Blood Culture (routine x 2)     Status: None (Preliminary result)   Collection Time: 04/02/20 12:09 AM   Specimen: BLOOD RIGHT HAND  Result Value Ref Range Status   Specimen Description BLOOD RIGHT HAND  Final   Special Requests   Final    BOTTLES DRAWN AEROBIC AND ANAEROBIC Blood Culture adequate volume   Culture   Final    NO GROWTH 1 DAY Performed at [facility], [address] [place], [place] [address]    Report Status PENDING  Incomplete  Respiratory Panel by RT PCR (Flu A&B, Covid) - Nasopharyngeal Swab     Status: None   Collection Time: 04/02/20 12:09 AM   Specimen: Nasopharyngeal  Swab  Result Value Ref Range Status   SARS Coronavirus 2 by RT PCR NEGATIVE NEGATIVE Final    Comment: (NOTE) SARS-CoV-2 target nucleic acids are NOT DETECTED. The SARS-CoV-2 RNA is generally detectable in upper respiratoy specimens during the acute phase of infection. The lowest concentration of SARS-CoV-2 viral copies this assay can detect is 131 copies/mL. A negative result does not preclude SARS-Cov-2 infection and should not be used as the sole basis for treatment or other patient management decisions. A negative result may occur with  improper specimen collection/handling, submission of specimen other than nasopharyngeal swab, presence of viral mutation(s) within the areas targeted by this assay,  and inadequate number of viral copies (<131 copies/mL). A negative result must be combined with clinical observations, patient history, and epidemiological information. The expected result is Negative. Fact Sheet for Patients:  PinkCheek.be Fact Sheet for Healthcare Providers:  GravelBags.it This test is not yet ap proved or cleared by the [place] FDA and  has been authorized for detection and/or diagnosis of SARS-CoV-2 by FDA under an Emergency Use Authorization (EUA). This EUA will remain  in effect (meaning this test can be used) for the duration of the COVID-19 declaration under Section 564(b)(1) of the Act, 21 U.S.C. section 360bbb-3(b)(1), unless the authorization is terminated or revoked sooner.    Influenza A by PCR NEGATIVE NEGATIVE Final   Influenza B by PCR NEGATIVE NEGATIVE Final    Comment: (NOTE) The Xpert Xpress SARS-CoV-2/FLU/RSV assay is intended as an aid in  the diagnosis of influenza from Nasopharyngeal swab specimens and  should not be used as a sole basis for treatment. Nasal washings and  aspirates are unacceptable for Xpert Xpress SARS-CoV-2/FLU/RSV  testing. Fact Sheet for Patients: PinkCheek.be Fact Sheet for Healthcare Providers: GravelBags.it This test is not yet approved or cleared by the [place] FDA and  has been authorized for detection and/or diagnosis of SARS-CoV-2 by  FDA under an Emergency Use Authorization (EUA). This EUA will remain  in effect (meaning this test can be used) for the duration of the  Covid-19 declaration under Section 564(b)(1) of the Act, 21  U.S.C. section 360bbb-3(b)(1), unless the authorization is  terminated or revoked. Performed at [facility], [address] [place], [place] [address]   Blood Culture (routine x 2)     Status: None (Preliminary result)   Collection Time: 04/02/20  12:14 AM   Specimen: Right Antecubital; Blood  Result Value Ref Range Status   Specimen Description RIGHT ANTECUBITAL  Final   Special Requests   Final    BOTTLES DRAWN AEROBIC AND ANAEROBIC Blood Culture adequate volume   Culture   Final    NO GROWTH 1 DAY Performed at [facility], [address], [place], [place] [address]    Report Status PENDING  Incomplete  MRSA PCR Screening     Status: None   Collection Time: 04/02/20  3:14 AM   Specimen: Nasopharyngeal  Result Value Ref Range Status   MRSA by PCR NEGATIVE NEGATIVE Final    Comment:        The GeneXpert MRSA Assay (FDA approved for NASAL specimens only), is one component of a comprehensive MRSA colonization surveillance program. It is not intended to diagnose MRSA infection nor to guide or monitor treatment for MRSA infections. Performed at [facility], [address] [place], [place] [address]     Coagulation Studies: No results for input(s): LABPROT, INR in the last 72 hours.  Urinalysis: No  results for input(s): COLORURINE, LABSPEC, Jo[place]quin, GLUCOSEU, HGBUR, BILIRUBINUR, KETONESUR, PROTEINUR, UROBILINOGEN, NITRITE, LEUKOCYTESUR in the last 72 hours.  Invalid input(s): APPERANCEUR    Imaging: DG Chest Port 1 View  Result Date: 04/02/2020 CLINICAL DATA:  Dyspnea EXAM: PORTABLE CHEST 1 VIEW COMPARISON:  October 23, 2019 FINDINGS: There is stable cardiomegaly. There is prominence of the pulmonary vasculature with increased interstitial markings/fluffy airspace opacities throughout both lungs. No focal airspace consolidation or pleural effusion. Overlying median sternotomy wires and tracheostomy tube is seen. Vascular stent is seen within the left proximal arm. IMPRESSION: Cardiomegaly and interstitial edema. Electronically Signed   By: Prudencio Pair M.D.   On: 04/02/2020 00:42     Medications:    . amiodarone  200 mg Oral Daily  . apixaban  2.5 mg Oral BID  . aspirin EC  81 mg Oral Daily   . atorvastatin  80 mg Oral q1800  . Chlorhexidine Gluconate Cloth  6 each Topical Daily  . gabapentin  300 mg Oral TID  . insulin aspart  0-5 Units Subcutaneous QHS  . insulin aspart  0-6 Units Subcutaneous TID WC  . melatonin  5 mg Oral QHS  . midodrine  5 mg Oral Once per day on Mon Wed Fri  . multivitamin  1 tablet Oral Daily  . pantoprazole  40 mg Oral BID  . polyethylene glycol  17 g Oral Daily   ALPRAZolam, docusate sodium, ipratropium-albuterol, lidocaine-prilocaine, ondansetron (ZOFRAN) IV, polyethylene glycol, traZODone  Assessment/ Plan:  Deborah [name] is a 64 y.o. black female with with end stage renal disease on hemodialysis, tracheostomy, hypotension, diabetes mellitus type II insulin dependent, diabetic neuropathy,  coronary artery disease, mitral valve repair, atrial fibrillation (anticoagulation with apixaban) admitted to [facility] on 04/01/2020 for Acute respiratory failure [facility]) [J96.00]   [facility] Nephrology MWF [facility]  left AVG Chronic tracheostomy  1. End Stage Renal Disease: with acute pulmonary edema - emergent dialysis this morning on admission.  2 L were removed - Continue MWF schedule. Monitor daily for dialysis need.  -Discussed with patient that she still may have some residual volume overload as she is requiring 5 L oxygen supplementation -She has agreed to UF only treatment.  Orders placed for 1 to 1.5 L fluid removal  2. Acute hypoxic Respiratory failure: requiring oxygen. Secondary to pulmonary edema.  With chronic tracheostomy See above  3.  Anemia of chronic kidney disease:   Lab Results  Component Value Date   HGB 8.5 (L) 04/03/2020  We will schedule Epogen with routine hemodialysis treatment starting Monday   4. Secondary Hyperparathyroidism: outpatient labs on 4/7 PTH 203, phos 4.3, and calcium 8.9.  Lab Results  Component Value Date   PTH 270 (H) 10/03/2017   CALCIUM 8.1 (L) 04/03/2020   CAION 1.09 (L) 09/01/2016   PHOS 4.9 (H)  04/03/2020   - sevelamer with meals.     LOS: 1 Lyah Millirons 5/1/202111:05 AM

## 2020-04-04 DIAGNOSIS — J9601 Acute respiratory failure with hypoxia: Secondary | ICD-10-CM

## 2020-04-04 DIAGNOSIS — E8770 Fluid overload, unspecified: Principal | ICD-10-CM

## 2020-04-04 DIAGNOSIS — N186 End stage renal disease: Secondary | ICD-10-CM

## 2020-04-04 DIAGNOSIS — E119 Type 2 diabetes mellitus without complications: Secondary | ICD-10-CM

## 2020-04-04 DIAGNOSIS — J810 Acute pulmonary edema: Secondary | ICD-10-CM

## 2020-04-04 LAB — GLUCOSE, CAPILLARY
Glucose-Capillary: 140 mg/dL — ABNORMAL HIGH (ref 70–99)
Glucose-Capillary: 156 mg/dL — ABNORMAL HIGH (ref 70–99)

## 2020-04-04 MED ORDER — GABAPENTIN 300 MG PO CAPS: 300.0000 mg | ORAL_CAPSULE | Freq: Every day | ORAL | Status: AC

## 2020-04-04 NOTE — Discharge Summary (Signed)
Physician Discharge Summary  Deborah [name] VQM:[id number] DOB: Mar 27, 1956 DOA: 04/01/2020  PCP: Leta Speller, MD  Admit date: 04/01/2020 Discharge date: 04/04/2020  Recommendations for Outpatient Follow-up:  1. Routine care  Follow-up Information    Leta Speller, MD Follow up.   Specialty: Internal Medicine Why: Please schedule PCP follow up within 3-5 days of discharge date. Contact information: [address] [place] [address] [phone]            Discharge Diagnoses: Principal diagnosis is #1 1. Acute hypoxic respiratory failure secondary to volume overload secondary to missed hemodialysis session.  In context of chronic tracheostomy.  Chest x-ray showed pulmonary edema. 2. 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 ESRD on hemodialysis with associated anemia of ESRD 4. Diabetes mellitus type 2 with diabetic nephropathy 5. 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  Imaging revealed interstitial edema.  Missed hemodialysis 4/29.  Admitted by critical care for acute hypoxic respiratory failure secondary to pulmonary edema in the setting of ESRD and missed HD.  Hospital Course:  Patient underwent emergent hemodialysis with rapid clinical improvement.  Additional hemodialysis the following day, no hypoxia has resolved condition.  Back to baseline.  Hospitalization was uncomplicated.  Individual issues as below.  Discussed with Dr. Candiss Norse who  concurs.  Acute hypoxic respiratory failure secondary to volume overload secondary to missed hemodialysis session.  In context of chronic tracheostomy.  Chest x-ray showed pulmonary edema. 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  Continue Lantus, gabapentin  Chronic atrial fibrillation on apixaban --Continue amiodarone, apixaban, carvedilol  Significant Hospital Events    4/30 admitted for acute respiratory distress, hypoxia secondary to volume overload secondary to missed HD.  Seen by nephrology, underwent emergent hemodialysis.  5/1 wean down to 28% FiO2 on trach underwent ultrafiltration for 1-1.5 L removal.  Consults:   Admitted by critical care   nephrology  Procedures:   Emergent HD  Today's assessment: S: Feels back to normal today.  Breathing fine.  No complaints.  Discussed with nursing, no new issues. O: Vitals:  Vitals:   04/04/20 0420 04/04/20 0952  BP: (!) 98/59 (!) 116/55  Pulse: 67 65  Resp: 16 18  Temp: (!) 97.5 F (36.4 C) 98.7 F (37.1 C)  SpO2: 96% 94%    Constitutional:  . Appears calm and comfortable Respiratory:  . CTA bilaterally, no w/r/r.  . Respiratory effort normal.  SPO2 94-100% on room air Cardiovascular:  . RRR, no m/r/g . No LE extremity edema   Psychiatric:  . Mental status o Mood, affect appropriate  CBG stable  Discharge Instructions  Discharge Instructions    Activity as tolerated - No restrictions   Complete by: As directed    Diet - low sodium heart healthy   Complete by: As directed    Diet Carb Modified   Complete  by: As directed    Discharge instructions   Complete by: As directed    Call your physician or seek immediate medical attention for shortness of breath, swelling  or worsening of condition.     Allergies as of 04/04/2020   No Known Allergies     Medication List    TAKE these medications   acetaminophen 325 MG tablet Commonly known as: TYLENOL Take 650 mg by mouth.   albuterol (2.5 MG/3ML) 0.083% nebulizer solution Commonly known as: PROVENTIL Take 3 mLs (2.5 mg total) by nebulization 4 (four) times daily - after meals and at bedtime. What changed:   when to take this  reasons to take this   ALPRAZolam 0.25 MG tablet Commonly known as: XANAX Take 1 tablet (0.25 mg total) by mouth 2 (two) times daily as needed for anxiety.   amLODipine 5 MG tablet Commonly known as: NORVASC Take 10 mg by mouth daily.   apixaban 2.5 MG Tabs tablet Commonly known as: ELIQUIS Take 2.5 mg by mouth 2 (two) times daily.   aspirin 81 MG EC tablet Take 1 tablet (81 mg total) by mouth daily.   atorvastatin 80 MG tablet Commonly known as: LIPITOR Take 80 mg by mouth daily at 6 PM.   carvedilol 25 MG tablet Commonly known as: COREG Take 25 mg by mouth 2 (two) times daily.   escitalopram 10 MG tablet Commonly known as: LEXAPRO Take 10 mg by mouth daily.   furosemide 40 MG tablet Commonly known as: LASIX Take 40 mg by mouth daily. ON mornings of dialysis, Monday, Wednesday, Friday   gabapentin 300 MG capsule Commonly known as: NEURONTIN Take 1 capsule (300 mg total) by mouth at bedtime.   Levemir FlexTouch 100 UNIT/ML FlexPen Generic drug: insulin detemir Inject 13 Units into the skin at bedtime.   lidocaine-prilocaine cream Commonly known as: EMLA Apply 1 application topically every dialysis.   midodrine 5 MG tablet Commonly known as: PROAMATINE Take 5 mg by mouth 3 (three) times a week.   olmesartan 20 MG tablet Commonly known as: BENICAR Take 20 mg by mouth daily.   pantoprazole 40 MG tablet Commonly known as: PROTONIX Take 1 tablet (40 mg total) by mouth 2 (two) times daily.   Rena-Vite Rx 1 MG Tabs Take 1 tablet by mouth  daily.   sevelamer carbonate 800 MG tablet Commonly known as: RENVELA Take 1,600 mg by mouth 3 (three) times daily with meals.   traZODone 50 MG tablet Commonly known as: DESYREL Take 50 mg by mouth at bedtime as needed for sleep.      No Known Allergies  The results of significant diagnostics from this hospitalization (including imaging, microbiology, ancillary and laboratory) are listed below for reference.    Significant Diagnostic Studies: DG Chest Port 1 View  Result Date: 04/02/2020 CLINICAL DATA:  Dyspnea EXAM: PORTABLE CHEST 1 VIEW COMPARISON:  October 23, 2019 FINDINGS: There is stable cardiomegaly. There is prominence of the pulmonary vasculature with increased interstitial markings/fluffy airspace opacities throughout both lungs. No focal airspace consolidation or pleural effusion. Overlying median sternotomy wires and tracheostomy tube is seen. Vascular stent is seen within the left proximal arm. IMPRESSION: Cardiomegaly and interstitial edema. Electronically Signed   By: Prudencio Pair M.D.   On: 04/02/2020 00:42    Microbiology: Recent Results (from the past 240 hour(s))  Blood Culture (routine x 2)     Status: None (Preliminary result)   Collection Time: 04/02/20 12:09 AM   Specimen: BLOOD RIGHT  HAND  Result Value Ref Range Status   Specimen Description BLOOD RIGHT HAND  Final   Special Requests   Final    BOTTLES DRAWN AEROBIC AND ANAEROBIC Blood Culture adequate volume   Culture   Final    NO GROWTH 2 DAYS Performed at [facility], [address], [place], [place] [address]    Report Status PENDING  Incomplete  Respiratory Panel by RT PCR (Flu A&B, Covid) - Nasopharyngeal Swab     Status: None   Collection Time: 04/02/20 12:09 AM   Specimen: Nasopharyngeal Swab  Result Value Ref Range Status   SARS Coronavirus 2 by RT PCR NEGATIVE NEGATIVE Final    Comment: (NOTE) SARS-CoV-2 target nucleic acids are NOT DETECTED. The SARS-CoV-2 RNA is generally  detectable in upper respiratoy specimens during the acute phase of infection. The lowest concentration of SARS-CoV-2 viral copies this assay can detect is 131 copies/mL. A negative result does not preclude SARS-Cov-2 infection and should not be used as the sole basis for treatment or other patient management decisions. A negative result may occur with  improper specimen collection/handling, submission of specimen other than nasopharyngeal swab, presence of viral mutation(s) within the areas targeted by this assay, and inadequate number of viral copies (<131 copies/mL). A negative result must be combined with clinical observations, patient history, and epidemiological information. The expected result is Negative. Fact Sheet for Patients:  PinkCheek.be Fact Sheet for Healthcare Providers:  GravelBags.it This test is not yet ap proved or cleared by the [place] FDA and  has been authorized for detection and/or diagnosis of SARS-CoV-2 by FDA under an Emergency Use Authorization (EUA). This EUA will remain  in effect (meaning this test can be used) for the duration of the COVID-19 declaration under Section 564(b)(1) of the Act, 21 U.S.C. section 360bbb-3(b)(1), unless the authorization is terminated or revoked sooner.    Influenza A by PCR NEGATIVE NEGATIVE Final   Influenza B by PCR NEGATIVE NEGATIVE Final    Comment: (NOTE) The Xpert Xpress SARS-CoV-2/FLU/RSV assay is intended as an aid in  the diagnosis of influenza from Nasopharyngeal swab specimens and  should not be used as a sole basis for treatment. Nasal washings and  aspirates are unacceptable for Xpert Xpress SARS-CoV-2/FLU/RSV  testing. Fact Sheet for Patients: PinkCheek.be Fact Sheet for Healthcare Providers: GravelBags.it This test is not yet approved or cleared by the [place] FDA and  has been  authorized for detection and/or diagnosis of SARS-CoV-2 by  FDA under an Emergency Use Authorization (EUA). This EUA will remain  in effect (meaning this test can be used) for the duration of the  Covid-19 declaration under Section 564(b)(1) of the Act, 21  U.S.C. section 360bbb-3(b)(1), unless the authorization is  terminated or revoked. Performed at [facility], [address] [place], [place] [address]   Blood Culture (routine x 2)     Status: None (Preliminary result)   Collection Time: 04/02/20 12:14 AM   Specimen: Right Antecubital; Blood  Result Value Ref Range Status   Specimen Description RIGHT ANTECUBITAL  Final   Special Requests   Final    BOTTLES DRAWN AEROBIC AND ANAEROBIC Blood Culture adequate volume   Culture   Final    NO GROWTH 2 DAYS Performed at [facility], [address], [place], [place] [address]    Report Status PENDING  Incomplete  MRSA PCR Screening     Status: None   Collection Time: 04/02/20  3:14 AM  Specimen: Nasopharyngeal  Result Value Ref Range Status   MRSA by PCR NEGATIVE NEGATIVE Final    Comment:        The GeneXpert MRSA Assay (FDA approved for NASAL specimens only), is one component of a comprehensive MRSA colonization surveillance program. It is not intended to diagnose MRSA infection nor to guide or monitor treatment for MRSA infections. Performed at [facility], [address] [place], [place] [address]      Labs: Basic Metabolic Panel: Recent Labs  Lab 04/02/20 0003 04/02/20 0338 04/03/20 0404  NA 141 143 136  K 4.0 4.4 3.9  CL 100 101 97*  CO2 27 28 29   GLUCOSE 166* 132* 163*  BUN 81* 89* 40*  CREATININE 9.97* 9.78* 6.18*  CALCIUM 8.7* 8.5* 8.1*  MG  --  2.0  --   PHOS  --  4.4  4.3 4.9*   Liver Function Tests: Recent Labs  Lab 04/02/20 0338 04/03/20 0404  ALBUMIN 3.9 3.3*   CBC: Recent Labs  Lab 04/02/20 0003 04/02/20 0338 04/03/20 0404  WBC 8.4 9.6 8.1    NEUTROABS 6.0  --   --   HGB 9.6* 10.5* 8.5*  HCT 29.1* 31.9* 26.4*  MCV 91.2 91.4 93.3  PLT 260 275 225    Recent Labs    10/18/19 0517 04/02/20 0003 04/03/20 0404  BNP 796.0* 959.0* 598.0*   CBG: Recent Labs  Lab 04/02/20 2335 04/03/20 0756 04/03/20 1720 04/03/20 2122 04/04/20 0753  GLUCAP 162* 125* 114* 212* 140*    Active Problems:   Acute respiratory failure ([place])   Time coordinating discharge: 35 minutes  Signed:  Murray Hodgkins, MD  [facility]  04/04/2020, 9:52 AM

## 2020-04-04 NOTE — Progress Notes (Signed)
Left upper arm lesion Advised patient to follow-up with dermatology

## 2020-04-04 NOTE — Progress Notes (Signed)
[facility]  ROUNDING NOTE   Subjective:   [name] admitted to [facility] on 04/01/2020 for Acute respiratory failure ([place]) [J96.00]   Patient underwent hemodialysis yesterday and another 1500 cc has been removed Today she is off oxygen and is asking about going home  Objective:  Vital signs in last 24 hours:  Temp:  [97.5 F (36.4 C)-99.1 F (37.3 C)] 97.5 F (36.4 C) (05/02 0420) Pulse Rate:  [63-71] 67 (05/02 0420) Resp:  [12-20] 16 (05/02 0420) BP: (98-178)/(55-76) 98/59 (05/02 0420) SpO2:  [95 %-100 %] 96 % (05/02 0420) FiO2 (%):  [28 %] 28 % (05/02 0221)  Weight change:  Filed Weights   04/02/20 0002 04/02/20 0457 04/03/20 0500  Weight: 63.5 kg 66.7 kg 67.3 kg    Intake/Output: I/O last 3 completed shifts: In: -  Out: 1650 [Urine:150; Other:1500]   Intake/Output this shift:  No intake/output data recorded.  Physical Exam: General: NAD,   Head: Normocephalic, atraumatic. Moist oral mucosal membranes  Eyes: Anicteric,  Neck: +tracheostomy  Lungs:   Clear to auscultation  Heart: Regular rate and rhythm  Abdomen:  Soft, nontender,   Extremities:  + peripheral edema.  Neurologic:  Alert, oriented  Skin: No lesions  Access: Left AVG    Basic Metabolic Panel: Recent Labs  Lab 04/02/20 0003 04/02/20 0338 04/03/20 0404  NA 141 143 136  K 4.0 4.4 3.9  CL 100 101 97*  CO2 27 28 29   GLUCOSE 166* 132* 163*  BUN 81* 89* 40*  CREATININE 9.97* 9.78* 6.18*  CALCIUM 8.7* 8.5* 8.1*  MG  --  2.0  --   PHOS  --  4.4  4.3 4.9*    Liver Function Tests: Recent Labs  Lab 04/02/20 0338 04/03/20 0404  ALBUMIN 3.9 3.3*   No results for input(s): LIPASE, AMYLASE in the last 168 hours. No results for input(s): AMMONIA in the last 168 hours.  CBC: Recent Labs  Lab 04/02/20 0003 04/02/20 0338 04/03/20 0404  WBC 8.4 9.6 8.1  NEUTROABS 6.0  --   --   HGB 9.6* 10.5* 8.5*  HCT 29.1* 31.9* 26.4*  MCV 91.2 91.4 93.3  PLT 260 275 225     Cardiac Enzymes: No results for input(s): CKTOTAL, CKMB, CKMBINDEX, TROPONINI in the last 168 hours.  BNP: Invalid input(s): POCBNP  CBG: Recent Labs  Lab 04/02/20 2335 04/03/20 0756 04/03/20 1720 04/03/20 2122 04/04/20 0753  GLUCAP 162* 125* 114* 212* 140*    Microbiology: Results for orders placed or performed during the hospital encounter of 04/01/20  Blood Culture (routine x 2)     Status: None (Preliminary result)   Collection Time: 04/02/20 12:09 AM   Specimen: BLOOD RIGHT HAND  Result Value Ref Range Status   Specimen Description BLOOD RIGHT HAND  Final   Special Requests   Final    BOTTLES DRAWN AEROBIC AND ANAEROBIC Blood Culture adequate volume   Culture   Final    NO GROWTH 2 DAYS Performed at [facility], [address], [place], [place] [address]    Report Status PENDING  Incomplete  Respiratory Panel by RT PCR (Flu A&B, Covid) - Nasopharyngeal Swab     Status: None   Collection Time: 04/02/20 12:09 AM   Specimen: Nasopharyngeal Swab  Result Value Ref Range Status   SARS Coronavirus 2 by RT PCR NEGATIVE NEGATIVE Final    Comment: (NOTE) SARS-CoV-2 target nucleic acids are NOT DETECTED. The SARS-CoV-2 RNA is generally detectable  in upper respiratoy specimens during the acute phase of infection. The lowest concentration of SARS-CoV-2 viral copies this assay can detect is 131 copies/mL. A negative result does not preclude SARS-Cov-2 infection and should not be used as the sole basis for treatment or other patient management decisions. A negative result may occur with  improper specimen collection/handling, submission of specimen other than nasopharyngeal swab, presence of viral mutation(s) within the areas targeted by this assay, and inadequate number of viral copies (<131 copies/mL). A negative result must be combined with clinical observations, patient history, and epidemiological information. The expected result is Negative. Fact Sheet  for Patients:  PinkCheek.be Fact Sheet for Healthcare Providers:  GravelBags.it This test is not yet ap proved or cleared by the [place] FDA and  has been authorized for detection and/or diagnosis of SARS-CoV-2 by FDA under an Emergency Use Authorization (EUA). This EUA will remain  in effect (meaning this test can be used) for the duration of the COVID-19 declaration under Section 564(b)(1) of the Act, 21 U.S.C. section 360bbb-3(b)(1), unless the authorization is terminated or revoked sooner.    Influenza A by PCR NEGATIVE NEGATIVE Final   Influenza B by PCR NEGATIVE NEGATIVE Final    Comment: (NOTE) The Xpert Xpress SARS-CoV-2/FLU/RSV assay is intended as an aid in  the diagnosis of influenza from Nasopharyngeal swab specimens and  should not be used as a sole basis for treatment. Nasal washings and  aspirates are unacceptable for Xpert Xpress SARS-CoV-2/FLU/RSV  testing. Fact Sheet for Patients: PinkCheek.be Fact Sheet for Healthcare Providers: GravelBags.it This test is not yet approved or cleared by the [place] FDA and  has been authorized for detection and/or diagnosis of SARS-CoV-2 by  FDA under an Emergency Use Authorization (EUA). This EUA will remain  in effect (meaning this test can be used) for the duration of the  Covid-19 declaration under Section 564(b)(1) of the Act, 21  U.S.C. section 360bbb-3(b)(1), unless the authorization is  terminated or revoked. Performed at [facility], [address] [place], [place] [address]   Blood Culture (routine x 2)     Status: None (Preliminary result)   Collection Time: 04/02/20 12:14 AM   Specimen: Right Antecubital; Blood  Result Value Ref Range Status   Specimen Description RIGHT ANTECUBITAL  Final   Special Requests   Final    BOTTLES DRAWN AEROBIC AND ANAEROBIC Blood Culture adequate  volume   Culture   Final    NO GROWTH 2 DAYS Performed at [facility], [address], [place], [place] [address]    Report Status PENDING  Incomplete  MRSA PCR Screening     Status: None   Collection Time: 04/02/20  3:14 AM   Specimen: Nasopharyngeal  Result Value Ref Range Status   MRSA by PCR NEGATIVE NEGATIVE Final    Comment:        The GeneXpert MRSA Assay (FDA approved for NASAL specimens only), is one component of a comprehensive MRSA colonization surveillance program. It is not intended to diagnose MRSA infection nor to guide or monitor treatment for MRSA infections. Performed at [facility], [address] [place], [place] [address]     Coagulation Studies: No results for input(s): LABPROT, INR in the last 72 hours.  Urinalysis: No results for input(s): COLORURINE, LABSPEC, PHURINE, GLUCOSEU, HGBUR, BILIRUBINUR, KETONESUR, PROTEINUR, UROBILINOGEN, NITRITE, LEUKOCYTESUR in the last 72 hours.  Invalid input(s): APPERANCEUR    Imaging: No results found.   Medications:    .  amiodarone  200 mg Oral Daily  . apixaban  2.5 mg Oral BID  . aspirin EC  81 mg Oral Daily  . atorvastatin  80 mg Oral q1800  . Chlorhexidine Gluconate Cloth  6 each Topical Daily  . gabapentin  300 mg Oral TID  . insulin aspart  0-5 Units Subcutaneous QHS  . insulin aspart  0-6 Units Subcutaneous TID WC  . melatonin  5 mg Oral QHS  . midodrine  5 mg Oral Once per day on Mon Wed Fri  . multivitamin  1 tablet Oral Daily  . pantoprazole  40 mg Oral BID  . polyethylene glycol  17 g Oral Daily  . torsemide  40 mg Oral Daily   ALPRAZolam, docusate sodium, ipratropium-albuterol, lidocaine-prilocaine, ondansetron (ZOFRAN) IV, polyethylene glycol, traZODone  Assessment/ Plan:  [name] is a 64 y.o. black female with with end stage renal disease on hemodialysis, tracheostomy, hypotension, diabetes mellitus type II insulin dependent, diabetic neuropathy,   coronary artery disease, mitral valve repair, atrial fibrillation (anticoagulation with apixaban) admitted to [facility] on 04/01/2020 for Acute respiratory failure [facility]) [J96.00]   [facility] MWF [facility] left AVG Chronic tracheostomy  1. End Stage Renal Disease: with acute pulmonary edema - emergent dialysis at the time of admission.  Total of 3.5 L removed this admission - Continue MWF schedule. Monitor daily for dialysis need.  -Patient is now off oxygen supplementation -She may resume her normal dialysis as outpatient   2. Acute hypoxic Respiratory failure: requiring oxygen. Secondary to pulmonary edema.  With chronic tracheostomy See above  3.  Anemia of chronic kidney disease:   Lab Results  Component Value Date   HGB 8.5 (L) 04/03/2020  Continue ESA agents with routine hemodialysis treatment as outpatient   4. Secondary Hyperparathyroidism: outpatient labs on 4/7 PTH 203, phos 4.3, and calcium 8.9.  Lab Results  Component Value Date   PTH 270 (H) 10/03/2017   CALCIUM 8.1 (L) 04/03/2020   CAION 1.09 (L) 09/01/2016   PHOS 4.9 (H) 04/03/2020   - sevelamer with meals.     LOS: 2 [address] 5/2/20219:34 AM

## 2020-04-04 NOTE — Plan of Care (Signed)
No falls. Airway suctioning provided as needed. Oxygen 93% on room air. No falls. IV removed.  Problem: Education: Goal: Knowledge of General Education information will improve Description: Including pain rating scale, medication(s)/side effects and non-pharmacologic comfort measures Outcome: Completed/Met   Problem: Education: Goal: Knowledge of General Education information will improve Description: Including pain rating scale, medication(s)/side effects and non-pharmacologic comfort measures Outcome: Completed/Met   Problem: Health Behavior/Discharge Planning: Goal: Ability to manage health-related needs will improve Outcome: Completed/Met   Problem: Clinical Measurements: Goal: Ability to maintain clinical measurements within normal limits will improve Outcome: Completed/Met Goal: Will remain free from infection Outcome: Completed/Met Goal: Diagnostic test results will improve Outcome: Completed/Met Goal: Respiratory complications will improve Outcome: Completed/Met Goal: Cardiovascular complication will be avoided Outcome: Completed/Met   Problem: Activity: Goal: Risk for activity intolerance will decrease Outcome: Completed/Met   Problem: Nutrition: Goal: Adequate nutrition will be maintained Outcome: Completed/Met   Problem: Coping: Goal: Level of anxiety will decrease Outcome: Completed/Met   Problem: Elimination: Goal: Will not experience complications related to bowel motility Outcome: Completed/Met Goal: Will not experience complications related to urinary retention Outcome: Completed/Met   Problem: Pain Managment: Goal: General experience of comfort will improve Outcome: Completed/Met   Problem: Safety: Goal: Ability to remain free from injury will improve Outcome: Completed/Met   Problem: Skin Integrity: Goal: Risk for impaired skin integrity will decrease Outcome: Completed/Met

## 2020-04-07 LAB — CULTURE, BLOOD (ROUTINE X 2)
Culture: NO GROWTH
Culture: NO GROWTH
Special Requests: ADEQUATE
Special Requests: ADEQUATE

## 2020-05-27 ENCOUNTER — Other Ambulatory Visit: Payer: Self-pay | Admitting: Podiatry

## 2020-06-02 ENCOUNTER — Other Ambulatory Visit: Payer: Self-pay | Admitting: Podiatry

## 2020-06-09 ENCOUNTER — Other Ambulatory Visit: Payer: Self-pay | Admitting: Podiatry

## 2020-06-11 ENCOUNTER — Other Ambulatory Visit: Payer: Self-pay | Admitting: Podiatry

## 2020-06-15 ENCOUNTER — Other Ambulatory Visit: Payer: Self-pay

## 2020-06-15 DIAGNOSIS — E1140 Type 2 diabetes mellitus with diabetic neuropathy, unspecified: Secondary | ICD-10-CM | POA: Diagnosis not present

## 2020-06-15 DIAGNOSIS — E1122 Type 2 diabetes mellitus with diabetic chronic kidney disease: Secondary | ICD-10-CM | POA: Diagnosis not present

## 2020-06-15 DIAGNOSIS — N183 Chronic kidney disease, stage 3 unspecified: Secondary | ICD-10-CM | POA: Insufficient documentation

## 2020-06-15 DIAGNOSIS — I5022 Chronic systolic (congestive) heart failure: Secondary | ICD-10-CM | POA: Diagnosis not present

## 2020-06-15 DIAGNOSIS — Z794 Long term (current) use of insulin: Secondary | ICD-10-CM | POA: Diagnosis not present

## 2020-06-15 DIAGNOSIS — I120 Hypertensive chronic kidney disease with stage 5 chronic kidney disease or end stage renal disease: Secondary | ICD-10-CM | POA: Insufficient documentation

## 2020-06-15 DIAGNOSIS — N186 End stage renal disease: Secondary | ICD-10-CM | POA: Insufficient documentation

## 2020-06-15 DIAGNOSIS — D689 Coagulation defect, unspecified: Secondary | ICD-10-CM | POA: Diagnosis present

## 2020-06-15 DIAGNOSIS — Z79899 Other long term (current) drug therapy: Secondary | ICD-10-CM | POA: Insufficient documentation

## 2020-06-15 DIAGNOSIS — I213 ST elevation (STEMI) myocardial infarction of unspecified site: Secondary | ICD-10-CM | POA: Diagnosis not present

## 2020-06-15 DIAGNOSIS — Z992 Dependence on renal dialysis: Secondary | ICD-10-CM | POA: Diagnosis not present

## 2020-06-15 NOTE — ED Triage Notes (Signed)
Pt arrives to ED via [facility] from home with c/o bleeding from her dialysis site x1 day. Pt states she went to remove the bandage and clean the site, at which point it started bleeding. Pt reports access site has been in place for "almost 4 years now". Pt is A&O, in NAD; RR even, regular, and unlabored. Pt reports being r/x'd Eliquis and taking as directed.

## 2020-06-16 ENCOUNTER — Emergency Department
Admission: EM | Admit: 2020-06-16 | Discharge: 2020-06-16 | Disposition: A | Discharge status: Home / Self Care | Payer: Medicare Other | Attending: Emergency Medicine | Admitting: Emergency Medicine

## 2020-06-16 DIAGNOSIS — L988 Other specified disorders of the skin and subcutaneous tissue: Secondary | ICD-10-CM

## 2020-06-16 DIAGNOSIS — N186 End stage renal disease: Secondary | ICD-10-CM

## 2020-06-16 NOTE — ED Notes (Signed)
Left arm wrapped with gauze and pressure dressing per EDP request.

## 2020-06-16 NOTE — ED Provider Notes (Signed)
[facility] Emergency Department Provider Note  ____________________________________________   First MD Initiated Contact with Patient 06/16/20 0149     (approximate)  I have reviewed the triage vital signs and the nursing notes.   HISTORY  Chief Complaint Coagulation Disorder    HPI Deborah [name] is a 64 y.o. female with ESRD on dialysis who comes in with concerns for bleeding from her dialysis site.  Patient states that she went to remove the bandage and clean the site which point started bleeding.  She is had this site for 4 years now.  To note patient is on Eliquis. Patient gets dialysis Monday, Wednesday, Friday. She denies any missed dialysis. She states that it normal for her to clean the area the day before her dialysis session. The son was with her and they noted that there was some venous bleeding out of it, it was not arterial or shooting. It was mild, nothing made it better, nothing made it worse. Was not a significant amount but just was kind of a slow leak. He put gauze and tape all the way around it.  She otherwise feels that her normal self.          Past Medical History:  Diagnosis Date   Diabetes mellitus    Hyperlipidemia    Hypertension    MI, old    Renal disorder    dialysls    Patient Active Problem List   Diagnosis Date Noted   Acute respiratory failure ([place]) 04/02/2020   Acute CHF (congestive heart failure) ([place]) 10/17/2019   Diabetic neuropathy ([place]) 10/16/2017   Tracheobronchitis 10/11/2017   Hemorrhage from arteriovenous dialysis graft ([place]) 10/05/2017   Sepsis ([place]) 09/08/2017   Adjustment disorder with anxious mood 12/09/2016   Bacteremia due to Staphylococcus aureus    Staphylococcus aureus bacteremia with sepsis ([place])    Uncontrolled type 2 diabetes mellitus with complication ([name])    ST elevation myocardial infarction involving left main coronary artery [facility])    Palliative care encounter     Advance care planning    Vocal cord dysfunction    Palliative care by specialist    Goals of care, counseling/discussion    Bacteremia    End-stage renal disease on hemodialysis ([place])    Paroxysmal atrial fibrillation ([place])    Heart attack ([place])    Uncontrolled type 2 diabetes mellitus with complication, without long-term current use of insulin ([place])    Staphylococcus aureus bacteremia 11/12/2016   Vocal cord paralysis    Respiratory distress 11/02/2016   Chronic anticoagulation    Acute lower UTI    Labile blood glucose    Leukocytosis    Subtherapeutic international normalized ratio (INR)    Supratherapeutic INR    Cough    Oropharyngeal dysphagia    Uncontrolled type 2 diabetes mellitus with complication, with long-term current use of insulin ([place])    Physical debility 25/95/6387   Metabolic encephalopathy 56/43/3295   Debility    ESRD on dialysis [facility])    Type 2 diabetes mellitus with complication, with long-term current use of insulin ([name])    Anemia of chronic disease    Coronary artery disease involving native coronary artery of native heart without angina pectoris    Acute on chronic respiratory failure with hypoxemia ([name])    Atrial flutter ([name])    Persistent cough    Dysphonia    Sleep disturbance    Diabetes mellitus type 2 in nonobese ([place])    Stage  3 chronic kidney disease    NSTEMI (non-ST elevated myocardial infarction) ([name])    Tachypnea    Hyponatremia    Lymphocytosis    Acute blood loss anemia    Pressure injury of skin 09/18/2016   History of tracheostomy    Delirium    Encounter for attention to tracheostomy [facility])    History of MVR with cardiopulmonary bypass    Systolic congestive heart failure ([name])    HCAP (healthcare-associated pneumonia)    PAF (paroxysmal atrial fibrillation) ([name])    Acute systolic congestive heart failure ([name])    AKI (acute kidney injury) ([name])    Abnormal LFTs    S/P  MVR (mitral valve replacement)    Mitral regurgitation 08/23/2016   Acute on chronic diastolic heart failure ([place]) 08/18/2016   Acute pulmonary edema ([name])    Respiratory failure ([place]) 08/17/2016   Cardiogenic shock ([place]) 08/17/2016   Acute hypoxemic respiratory failure ([name])    STEMI (ST elevation myocardial infarction) ([place]) 08/15/2016   Noncompliance 01/25/2015   Bereavement 07/09/2014   Low back pain 02/17/2014   Cervical cancer screening 11/20/2013   Routine general medical examination at a health care facility 10/14/2013   Chronic kidney disease, stage 3 06/14/2012   Hypertension 03/08/2012   Diabetes mellitus type 2, controlled 03/08/2012   Hyperlipidemia 03/08/2012    Past Surgical History:  Procedure Laterality Date   A/V FISTULAGRAM N/A 10/05/2017   Procedure: A/V Fistulagram;  Surgeon: Katha Cabal, MD;  Location: [place] CV LAB;  Service: Cardiovascular;  Laterality: N/A;   A/V SHUNTOGRAM N/A 10/05/2017   Procedure: A/V SHUNTOGRAM;  Surgeon: Katha Cabal, MD;  Location: [place] CV LAB;  Service: Cardiovascular;  Laterality: N/A;   AV FISTULA PLACEMENT Left 10/02/2016   Procedure: INSERTION OF ARTERIOVENOUS (AV) GORE-TEX GRAFT ARM;  Surgeon: Waynetta Sandy, MD;  Location: [place];  Service: Vascular;  Laterality: Left;   CARDIAC CATHETERIZATION N/A 08/15/2016   Procedure: Left Heart Cath and Coronary Angiography;  Surgeon: Yolonda Kida, MD;  Location: [place] CV LAB;  Service: Cardiovascular;  Laterality: N/A;   CARDIAC CATHETERIZATION N/A 08/15/2016   Procedure: Coronary Stent Intervention;  Surgeon: Yolonda Kida, MD;  Location: [place] CV LAB;  Service: Cardiovascular;  Laterality: N/A;   CARDIAC CATHETERIZATION N/A 08/18/2016   Procedure: Right Heart Cath;  Surgeon: Jolaine Artist, MD;  Location: [place] CV LAB;  Service: Cardiovascular;  Laterality: N/A;   CARDIAC CATHETERIZATION N/A  08/18/2016   Procedure: IABP Insertion;  Surgeon: Jolaine Artist, MD;  Location: [place] CV LAB;  Service: Cardiovascular;  Laterality: N/A;   CARDIAC CATHETERIZATION Right 08/23/2016   Procedure: CENTRAL LINE INSERTION RIGHT SUBCLAVIAN;  Surgeon: Ivin Poot, MD;  Location: [place];  Service: Open Heart Surgery;  Laterality: Right;   ENDOVEIN HARVEST OF GREATER SAPHENOUS VEIN Left 08/23/2016   Procedure: ENDOVEIN HARVEST OF GREATER SAPHENOUS VEIN;  Surgeon: Ivin Poot, MD;  Location: [place];  Service: Open Heart Surgery;  Laterality: Left;   INSERTION OF DIALYSIS CATHETER Bilateral 08/31/2016   Procedure: INSERTION OF DIALYSIS CATHETER LEFT INTERNAL JUGULAR VEIN & INSERTION OF TRIPLE LUMEN RIGHT INTERNAL JUGULAR VEIN;  Surgeon: Angelia Mould, MD;  Location: [place];  Service: Vascular;  Laterality: Bilateral;   INTRAOPERATIVE TRANSESOPHAGEAL ECHOCARDIOGRAM N/A 08/23/2016   Procedure: INTRAOPERATIVE TRANSESOPHAGEAL ECHOCARDIOGRAM;  Surgeon: Ivin Poot, MD;  Location: [place];  Service: Open Heart Surgery;  Laterality: N/A;   IR GENERIC  HISTORICAL  11/13/2016   IR US GUIDE VASC ACCESS LEFT 11/13/2016 Corrie Mckusick, DO [facility]INTER[facility]   IR GENERIC HISTORICAL  11/13/2016   IR FLUORO GUIDE CV LINE LEFT 11/13/2016 Corrie Mckusick, DO [facility]   IR GENERIC HISTORICAL  11/13/2016   IR GASTROSTOMY TUBE MOD SED 11/13/2016 Corrie Mckusick, DO [facility]   MITRAL VALVE REPAIR N/A 08/23/2016   Procedure: MITRAL VALVE REPAIR (MVR) USING 25MM EDWARDS MAGNA EASE BIOPROSTHESIS MITRAL  VALVE;  Surgeon: Ivin Poot, MD;  Location: [place];  Service: Open Heart Surgery;  Laterality: N/A;   tracheostomy reversal     TRACHEOSTOMY TUBE PLACEMENT N/A 11/09/2016   Procedure: TRACHEOSTOMY;  Surgeon: Melissa Montane, MD;  Location: [place];  Service: ENT;  Laterality: N/A;   VAGINAL DELIVERY     x 6    Prior to Admission medications   Medication Sig Start Date End Date Taking? Authorizing  Provider  acetaminophen (TYLENOL) 325 MG tablet Take 650 mg by mouth. 04/06/17   [provider]  albuterol (PROVENTIL) (2.5 MG/3ML) 0.083% nebulizer solution Take 3 mLs (2.5 mg total) by nebulization 4 (four) times daily - after meals and at bedtime. Patient taking differently: Take 2.5 mg by nebulization 3 (three) times daily as needed.  11/01/16   Love, Ivan Anchors, PA-C  ALPRAZolam (XANAX) 0.25 MG tablet Take 1 tablet (0.25 mg total) by mouth 2 (two) times daily as needed for anxiety. 10/24/19   Swayze, Ava, DO  amLODipine (NORVASC) 5 MG tablet Take 10 mg by mouth daily. 01/08/20   [provider]  apixaban (ELIQUIS) 2.5 MG TABS tablet Take 2.5 mg by mouth 2 (two) times daily.  06/22/17   [provider]  aspirin 81 MG EC tablet Take 1 tablet (81 mg total) by mouth daily. 10/24/19   Swayze, Ava, DO  atorvastatin (LIPITOR) 80 MG tablet Take 80 mg by mouth daily at 6 PM.     [provider]  B Complex-C-Folic Acid (RENA-VITE RX) 1 MG TABS Take 1 tablet by mouth daily. 09/01/19   [provider]  carvedilol (COREG) 25 MG tablet Take 25 mg by mouth 2 (two) times daily. 04/22/18   [provider]  escitalopram (LEXAPRO) 10 MG tablet Take 10 mg by mouth daily. 03/09/20   [provider]  furosemide (LASIX) 40 MG tablet Take 40 mg by mouth daily. ON mornings of dialysis, Monday, Wednesday, Friday    [provider]  gabapentin (NEURONTIN) 300 MG capsule Take 1 capsule (300 mg total) by mouth at bedtime. 04/04/20   Samuella Cota, MD  LEVEMIR FLEXTOUCH 100 UNIT/ML FlexPen Inject 13 Units into the skin at bedtime. 03/05/20   [provider]  lidocaine-prilocaine (EMLA) cream Apply 1 application topically every dialysis. 09/26/19   [provider]  midodrine (PROAMATINE) 5 MG tablet Take 5 mg by mouth 3 (three) times a week.    [provider]  olmesartan (BENICAR) 20 MG tablet Take 20 mg by mouth daily. 03/12/20    [provider]  pantoprazole (PROTONIX) 40 MG tablet Take 1 tablet (40 mg total) by mouth 2 (two) times daily. 11/01/16   Love, Ivan Anchors, PA-C  sevelamer carbonate (RENVELA) 800 MG tablet Take 1,600 mg by mouth 3 (three) times daily with meals.    [provider]  traZODone (DESYREL) 50 MG tablet Take 50 mg by mouth at bedtime as needed for sleep.  04/04/18   [provider]    Allergies Patient  has no known allergies.  Family History  Problem Relation Age of Onset   Hypertension Mother    Diabetes Mother    Breast cancer Sister     Social History Social History   Tobacco Use   Smoking status: Never Smoker   Smokeless tobacco: Never Used  Substance Use Topics   Alcohol use: No   Drug use: No      Review of Systems Constitutional: No fever/chills Eyes: No visual changes. ENT: No sore throat. Cardiovascular: Denies chest pain. Respiratory: Denies shortness of breath. Gastrointestinal: No abdominal pain.  No nausea, no vomiting.  No diarrhea.  No constipation. Genitourinary: Negative for dysuria. Musculoskeletal: Negative for back pain.  Bleeding from fistula Skin: Negative for rash. Neurological: Negative for headaches, focal weakness or numbness. All other ROS negative ____________________________________________   PHYSICAL EXAM:  VITAL SIGNS: ED Triage Vitals  Enc Vitals Group     BP 06/15/20 2124 (!) 168/77     Pulse Rate 06/15/20 2124 72     Resp 06/15/20 2124 17     Temp 06/15/20 2124 98.1 F (36.7 C)     Temp Source 06/15/20 2124 Oral     SpO2 06/15/20 2124 97 %     Weight 06/15/20 2121 140 lb (63.5 kg)     Height 06/15/20 2121 5\' 2"  (1.575 m)     Head Circumference --      Peak Flow --      Pain Score 06/15/20 2121 0     Pain Loc --      Pain Edu? --      Excl. in [place]? --     Constitutional: Alert and oriented. Well appearing and in no acute distress. Eyes: Conjunctivae are normal. EOMI. Head: Atraumatic. Nose:  No congestion/rhinnorhea. Mouth/Throat: Mucous membranes are moist.   Trach in place Neck: No stridor. Trachea Midline. FROM Cardiovascular: Normal rate, regular rhythm. Grossly normal heart sounds.  Good peripheral circulation. Respiratory: Normal respiratory effort.  No retractions. Lungs CTAB. Gastrointestinal: Soft and nontender. No distention. No abdominal bruits.  Musculoskeletal: No lower extremity tenderness nor edema.  No joint effusions.  Fistula noted to the left arm with good thrill.  Pinprick area with a dot of venous blood that immediately stops and forms clot without need to do any interventions..  2+ distal pulse.  No swelling of the arm. Neurologic:  Normal speech and language. No gross focal neurologic deficits are appreciated.  Skin:  Skin is warm, dry and intact. No rash noted. Psychiatric: Mood and affect are normal. Speech and behavior are normal. GU: Deferred   ____________________________________________     INITIAL IMPRESSION / ASSESSMENT AND PLAN / ED COURSE  Deborah [name] was evaluated in Emergency Department on 06/16/2020 for the symptoms described in the history of present illness. She was evaluated in the context of the global COVID-19 pandemic, which necessitated consideration that the patient might be at risk for infection with the SARS-CoV-2 virus that causes COVID-19. Institutional protocols and algorithms that pertain to the evaluation of patients at risk for COVID-19 are in a state of rapid change based on information released by regulatory bodies including the CDC and federal and state organizations. These policies and algorithms were followed during the patient's care in the ED.     Patient has been in the ER for over 6 hours prior to my evaluation. I remove the dressing that the son had placed and there was a drop of venous blood over the area. We watched  the area for over an hour there was a clot formed but there was no further bleeding. Patient has not  missed any dialysis to suggest needing labs and she has dialysis this morning that she states that she can get to. She also according to family of her did not lose a significant amount of blood to suggest worsening anemia. From this. Patient has been in the ER for over 7 hours it has been stable and feels comfortable with being discharged home given the area was no longer bleeding. We placed another gauze with compressive bandage over it does she can follow-up with dialysis in a few hours.      ____________________________________________   FINAL CLINICAL IMPRESSION(S) / ED DIAGNOSES   Final diagnoses:  ESRD (end stage renal disease) ([place])  Fistula      MEDICATIONS GIVEN DURING THIS VISIT:  Medications - No data to display   ED Discharge Orders    None       Note:  This document was prepared using Dragon voice recognition software and may include unintentional dictation errors.   Vanessa Solomons, MD 06/16/20 [phone]

## 2020-06-16 NOTE — Discharge Instructions (Addendum)
There is no longer any bleeding from your fistula. When you go to your dialysis center today they can check the area and see if it still bleeding at that time. Make sure to let them know about this issue that you had to help prevent something like this from happening again

## 2020-06-25 ENCOUNTER — Inpatient Hospital Stay
Admission: EM | Admit: 2020-06-25 | Discharge: 2020-06-27 | DRG: 189 | Disposition: A | Discharge status: Home / Self Care | Payer: Medicare Other | Attending: Internal Medicine | Admitting: Internal Medicine

## 2020-06-25 ENCOUNTER — Emergency Department: Payer: Medicare Other

## 2020-06-25 ENCOUNTER — Inpatient Hospital Stay: Payer: Medicare Other

## 2020-06-25 ENCOUNTER — Other Ambulatory Visit: Payer: Self-pay

## 2020-06-25 DIAGNOSIS — Z955 Presence of coronary angioplasty implant and graft: Secondary | ICD-10-CM | POA: Diagnosis not present

## 2020-06-25 DIAGNOSIS — N2581 Secondary hyperparathyroidism of renal origin: Secondary | ICD-10-CM | POA: Diagnosis present

## 2020-06-25 DIAGNOSIS — R509 Fever, unspecified: Secondary | ICD-10-CM | POA: Insufficient documentation

## 2020-06-25 DIAGNOSIS — I10 Essential (primary) hypertension: Secondary | ICD-10-CM | POA: Diagnosis not present

## 2020-06-25 DIAGNOSIS — Z7982 Long term (current) use of aspirin: Secondary | ICD-10-CM | POA: Diagnosis not present

## 2020-06-25 DIAGNOSIS — E785 Hyperlipidemia, unspecified: Secondary | ICD-10-CM | POA: Diagnosis not present

## 2020-06-25 DIAGNOSIS — E1142 Type 2 diabetes mellitus with diabetic polyneuropathy: Secondary | ICD-10-CM | POA: Diagnosis present

## 2020-06-25 DIAGNOSIS — E1169 Type 2 diabetes mellitus with other specified complication: Secondary | ICD-10-CM | POA: Diagnosis not present

## 2020-06-25 DIAGNOSIS — I5033 Acute on chronic diastolic (congestive) heart failure: Secondary | ICD-10-CM | POA: Diagnosis present

## 2020-06-25 DIAGNOSIS — Z833 Family history of diabetes mellitus: Secondary | ICD-10-CM | POA: Diagnosis not present

## 2020-06-25 DIAGNOSIS — Z79891 Long term (current) use of opiate analgesic: Secondary | ICD-10-CM

## 2020-06-25 DIAGNOSIS — Z992 Dependence on renal dialysis: Secondary | ICD-10-CM

## 2020-06-25 DIAGNOSIS — Z7901 Long term (current) use of anticoagulants: Secondary | ICD-10-CM

## 2020-06-25 DIAGNOSIS — I252 Old myocardial infarction: Secondary | ICD-10-CM

## 2020-06-25 DIAGNOSIS — I1311 Hypertensive heart and chronic kidney disease without heart failure, with stage 5 chronic kidney disease, or end stage renal disease: Secondary | ICD-10-CM | POA: Diagnosis present

## 2020-06-25 DIAGNOSIS — Z952 Presence of prosthetic heart valve: Secondary | ICD-10-CM | POA: Diagnosis not present

## 2020-06-25 DIAGNOSIS — R0602 Shortness of breath: Secondary | ICD-10-CM | POA: Diagnosis not present

## 2020-06-25 DIAGNOSIS — Z8249 Family history of ischemic heart disease and other diseases of the circulatory system: Secondary | ICD-10-CM | POA: Diagnosis not present

## 2020-06-25 DIAGNOSIS — I480 Paroxysmal atrial fibrillation: Secondary | ICD-10-CM | POA: Diagnosis present

## 2020-06-25 DIAGNOSIS — D631 Anemia in chronic kidney disease: Secondary | ICD-10-CM | POA: Diagnosis present

## 2020-06-25 DIAGNOSIS — J9601 Acute respiratory failure with hypoxia: Principal | ICD-10-CM | POA: Diagnosis present

## 2020-06-25 DIAGNOSIS — E1122 Type 2 diabetes mellitus with diabetic chronic kidney disease: Secondary | ICD-10-CM | POA: Diagnosis present

## 2020-06-25 DIAGNOSIS — N186 End stage renal disease: Secondary | ICD-10-CM | POA: Diagnosis present

## 2020-06-25 DIAGNOSIS — I2510 Atherosclerotic heart disease of native coronary artery without angina pectoris: Secondary | ICD-10-CM | POA: Diagnosis present

## 2020-06-25 DIAGNOSIS — Z794 Long term (current) use of insulin: Secondary | ICD-10-CM | POA: Diagnosis not present

## 2020-06-25 DIAGNOSIS — Z20822 Contact with and (suspected) exposure to covid-19: Secondary | ICD-10-CM | POA: Diagnosis present

## 2020-06-25 DIAGNOSIS — Z79899 Other long term (current) drug therapy: Secondary | ICD-10-CM | POA: Diagnosis not present

## 2020-06-25 DIAGNOSIS — Z930 Tracheostomy status: Secondary | ICD-10-CM

## 2020-06-25 DIAGNOSIS — Z8619 Personal history of other infectious and parasitic diseases: Secondary | ICD-10-CM | POA: Diagnosis not present

## 2020-06-25 LAB — CBC
HCT: 42 % (ref 36.0–46.0)
Hemoglobin: 13.8 g/dL (ref 12.0–15.0)
MCH: 28.1 pg (ref 26.0–34.0)
MCHC: 32.9 g/dL (ref 30.0–36.0)
MCV: 85.5 fL (ref 80.0–100.0)
Platelets: 242 10*3/uL (ref 150–400)
RBC: 4.91 MIL/uL (ref 3.87–5.11)
RDW: 16.9 % — ABNORMAL HIGH (ref 11.5–15.5)
WBC: 7.8 10*3/uL (ref 4.0–10.5)
nRBC: 0 % (ref 0.0–0.2)

## 2020-06-25 LAB — LACTIC ACID, PLASMA: Lactic Acid, Venous: 0.8 mmol/L (ref 0.5–1.9)

## 2020-06-25 LAB — COMPREHENSIVE METABOLIC PANEL
ALT: 14 U/L (ref 0–44)
AST: 20 U/L (ref 15–41)
Albumin: 4.1 g/dL (ref 3.5–5.0)
Alkaline Phosphatase: 76 U/L (ref 38–126)
Anion gap: 14 (ref 5–15)
BUN: 67 mg/dL — ABNORMAL HIGH (ref 8–23)
CO2: 29 mmol/L (ref 22–32)
Calcium: 9.5 mg/dL (ref 8.9–10.3)
Chloride: 93 mmol/L — ABNORMAL LOW (ref 98–111)
Creatinine, Ser: 8.07 mg/dL — ABNORMAL HIGH (ref 0.44–1.00)
GFR calc Af Amer: 6 mL/min — ABNORMAL LOW (ref >60)
GFR calc non Af Amer: 5 mL/min — ABNORMAL LOW (ref >60)
Glucose, Bld: 194 mg/dL — ABNORMAL HIGH (ref 70–99)
Potassium: 4.4 mmol/L (ref 3.5–5.1)
Sodium: 136 mmol/L (ref 135–145)
Total Bilirubin: 1.4 mg/dL — ABNORMAL HIGH (ref 0.3–1.2)
Total Protein: 8.4 g/dL — ABNORMAL HIGH (ref 6.5–8.1)

## 2020-06-25 LAB — GLUCOSE, CAPILLARY: Glucose-Capillary: 243 mg/dL — ABNORMAL HIGH (ref 70–99)

## 2020-06-25 LAB — SARS CORONAVIRUS 2 BY RT PCR (HOSPITAL ORDER, PERFORMED IN ~~LOC~~ HOSPITAL LAB): SARS Coronavirus 2: NEGATIVE

## 2020-06-25 MED ORDER — VANCOMYCIN HCL 500 MG/100ML IV SOLN
500.0000 mg | Freq: Once | INTRAVENOUS | Status: AC
  Administered 2020-06-25: 23:00:00 500 mg via INTRAVENOUS
  Filled 2020-06-25 (×2): qty 100

## 2020-06-25 MED ORDER — ACETAMINOPHEN 325 MG PO TABS: 650.0000 mg | ORAL_TABLET | Freq: Four times a day (QID) | ORAL | Status: DC | PRN

## 2020-06-25 MED ORDER — IRBESARTAN 150 MG PO TABS
150.0000 mg | ORAL_TABLET | Freq: Every day | ORAL | Status: DC
  Administered 2020-06-26 – 2020-06-27 (×2): 150 mg via ORAL
  Filled 2020-06-25 (×2): qty 1

## 2020-06-25 MED ORDER — ASPIRIN EC 81 MG PO TBEC
81.0000 mg | DELAYED_RELEASE_TABLET | Freq: Every day | ORAL | Status: DC
  Administered 2020-06-26 – 2020-06-27 (×2): 81 mg via ORAL
  Filled 2020-06-25 (×2): qty 1

## 2020-06-25 MED ORDER — VANCOMYCIN HCL 750 MG/150ML IV SOLN: 750.0000 mg | INTRAVENOUS | Status: DC

## 2020-06-25 MED ORDER — SEVELAMER CARBONATE 800 MG PO TABS
1600.0000 mg | ORAL_TABLET | Freq: Three times a day (TID) | ORAL | Status: DC
  Administered 2020-06-26 – 2020-06-27 (×3): 1600 mg via ORAL
  Filled 2020-06-25 (×6): qty 2

## 2020-06-25 MED ORDER — SODIUM CHLORIDE 0.9 % IV SOLN
2.0000 g | Freq: Once | INTRAVENOUS | Status: AC
  Administered 2020-06-25: 2 g via INTRAVENOUS
  Filled 2020-06-25: qty 2

## 2020-06-25 MED ORDER — PANTOPRAZOLE SODIUM 40 MG PO TBEC
40.0000 mg | DELAYED_RELEASE_TABLET | Freq: Every day | ORAL | Status: DC
  Administered 2020-06-26 – 2020-06-27 (×2): 40 mg via ORAL
  Filled 2020-06-25 (×2): qty 1

## 2020-06-25 MED ORDER — MIDODRINE HCL 5 MG PO TABS: 5.0000 mg | ORAL_TABLET | ORAL | Status: DC

## 2020-06-25 MED ORDER — FUROSEMIDE 40 MG PO TABS
40.0000 mg | ORAL_TABLET | Freq: Every day | ORAL | Status: DC
  Administered 2020-06-26 – 2020-06-27 (×2): 40 mg via ORAL
  Filled 2020-06-25 (×2): qty 1

## 2020-06-25 MED ORDER — TRAZODONE HCL 50 MG PO TABS: 50.0000 mg | ORAL_TABLET | ORAL | Status: DC

## 2020-06-25 MED ORDER — ONDANSETRON HCL 4 MG/2ML IJ SOLN: 4.0000 mg | Freq: Four times a day (QID) | INTRAMUSCULAR | Status: DC | PRN

## 2020-06-25 MED ORDER — AMLODIPINE BESYLATE 10 MG PO TABS
10.0000 mg | ORAL_TABLET | Freq: Every day | ORAL | Status: DC
  Administered 2020-06-26 – 2020-06-27 (×2): 10 mg via ORAL
  Filled 2020-06-25: qty 1
  Filled 2020-06-25: qty 2

## 2020-06-25 MED ORDER — ALBUTEROL SULFATE (2.5 MG/3ML) 0.083% IN NEBU
2.5000 mg | INHALATION_SOLUTION | Freq: Three times a day (TID) | RESPIRATORY_TRACT | Status: DC
  Administered 2020-06-26 – 2020-06-27 (×3): 2.5 mg via RESPIRATORY_TRACT
  Filled 2020-06-25 (×3): qty 3

## 2020-06-25 MED ORDER — CARVEDILOL 25 MG PO TABS
25.0000 mg | ORAL_TABLET | Freq: Two times a day (BID) | ORAL | Status: DC
  Administered 2020-06-25 – 2020-06-27 (×4): 25 mg via ORAL
  Filled 2020-06-25 (×4): qty 1

## 2020-06-25 MED ORDER — SODIUM CHLORIDE 0.9 % IV SOLN
1.0000 g | INTRAVENOUS | Status: DC
  Administered 2020-06-26: 1 g via INTRAVENOUS
  Filled 2020-06-25 (×2): qty 1

## 2020-06-25 MED ORDER — ACETAMINOPHEN 650 MG RE SUPP: 650.0000 mg | Freq: Four times a day (QID) | RECTAL | Status: DC | PRN

## 2020-06-25 MED ORDER — APIXABAN 2.5 MG PO TABS
2.5000 mg | ORAL_TABLET | Freq: Two times a day (BID) | ORAL | Status: DC
  Administered 2020-06-25 – 2020-06-27 (×4): 2.5 mg via ORAL
  Filled 2020-06-25 (×4): qty 1

## 2020-06-25 MED ORDER — GABAPENTIN 300 MG PO CAPS
300.0000 mg | ORAL_CAPSULE | Freq: Every day | ORAL | Status: DC
  Administered 2020-06-25 – 2020-06-26 (×2): 300 mg via ORAL
  Filled 2020-06-25 (×2): qty 1

## 2020-06-25 MED ORDER — RENA-VITE PO TABS
1.0000 | ORAL_TABLET | Freq: Every day | ORAL | Status: DC
  Administered 2020-06-26 – 2020-06-27 (×2): 1 via ORAL
  Filled 2020-06-25 (×2): qty 1

## 2020-06-25 MED ORDER — ATORVASTATIN CALCIUM 20 MG PO TABS
80.0000 mg | ORAL_TABLET | Freq: Every day | ORAL | Status: DC
  Administered 2020-06-26: 18:00:00 80 mg via ORAL
  Filled 2020-06-25: qty 4

## 2020-06-25 MED ORDER — ONDANSETRON HCL 4 MG PO TABS: 4.0000 mg | ORAL_TABLET | Freq: Four times a day (QID) | ORAL | Status: DC | PRN

## 2020-06-25 MED ORDER — LIDOCAINE-PRILOCAINE 2.5-2.5 % EX CREA
1.0000 "application " | TOPICAL_CREAM | CUTANEOUS | Status: DC | PRN
  Filled 2020-06-25: qty 5

## 2020-06-25 MED ORDER — IOHEXOL 350 MG/ML SOLN
75.0000 mL | Freq: Once | INTRAVENOUS | Status: AC | PRN
  Administered 2020-06-25: 75 mL via INTRAVENOUS

## 2020-06-25 MED ORDER — INSULIN DETEMIR 100 UNIT/ML ~~LOC~~ SOLN
13.0000 [IU] | Freq: Every day | SUBCUTANEOUS | Status: DC
  Administered 2020-06-25 – 2020-06-26 (×2): 13 [IU] via SUBCUTANEOUS
  Filled 2020-06-25 (×3): qty 0.13

## 2020-06-25 MED ORDER — VANCOMYCIN HCL IN DEXTROSE 1-5 GM/200ML-% IV SOLN
1000.0000 mg | Freq: Once | INTRAVENOUS | Status: AC
  Administered 2020-06-25: 1000 mg via INTRAVENOUS
  Filled 2020-06-25: qty 200

## 2020-06-25 NOTE — Progress Notes (Signed)
Pharmacy Antibiotic Note  Deborah [name] is a 64 y.o. female admitted on 06/25/2020 with fever in dialysis patient.  Pharmacy has been consulted for Vancomycin and Cefepime dosing.  Plan: Vancomycin - load with a total dose of 1500mg  IV followed by 750mg  IV after each dialysis session. Normally has dialysis every Monday, Wednesday, Friday. Will follow up schedule in house.  Cefepime 1g IV q24h  Height: 5\' 2"  (157.5 cm) Weight: 63.5 kg (140 lb) IBW/kg (Calculated) : 50.1  Temp (24hrs), Avg:99.5 F (37.5 C), Min:99.5 F (37.5 C), Max:99.5 F (37.5 C)  Recent Labs  Lab 06/25/20 1251 06/25/20 1700  WBC 7.8  --   CREATININE 8.07*  --   LATICACIDVEN  --  0.8    Estimated Creatinine Clearance: 6.3 mL/min (A) (by C-G formula based on SCr of 8.07 mg/dL (H)).    No Known Allergies  Antimicrobials this admission: Vancomycin 7/23 >>  Cefepime 7/23 >>   Dose adjustments this admission:  Microbiology results:   Thank you for allowing pharmacy to be a part of this patients care.  Paulina Fusi, PharmD, BCPS 06/25/2020 6:48 PM

## 2020-06-25 NOTE — ED Triage Notes (Signed)
Pt in via EMS from home with c/o cold sx's. Pt has fever of 104, nasal congestion and cough. Pt is also a dialysis patient.

## 2020-06-25 NOTE — ED Notes (Signed)
Attempted IV without success.

## 2020-06-25 NOTE — ED Triage Notes (Signed)
Pt home health nurse reports the pt having cough with congestion, fever, thickened secretions with a trach since last ngiht. Pt is in NAD at present.

## 2020-06-25 NOTE — H&P (Signed)
[facility] NAME: Deborah [name]    MR#:  [id number]  DATE OF BIRTH:  24-Jan-1956  DATE OF ADMISSION:  06/25/2020  PRIMARY CARE PHYSICIAN: Leta Speller, MD   REQUESTING/REFERRING PHYSICIAN: Dr Merlyn Lot  CHIEF COMPLAINT:   Chief Complaint  Patient presents with  . Cough  . Shortness of Breath    HISTORY OF PRESENT ILLNESS:  Deborah [name]  is a 64 y.o. female with a known history of tracheostomy.  She had her nurse come out and change out the trach because of congestion.  She has been coughing and bringing up whitish phlegm in the trach.  She has had some runny nose.  The nurse noted a fever of 100.4 at home.  She is a little short of breath.  Her pulse ox was low.  She has had some chills.  Occasional wheezing.  In the ER, she had a low pulse ox of 86% and was placed on oxygen.  Hospitalist services were contacted for further evaluation.  The patient did not go to dialysis today.  PAST MEDICAL HISTORY:   Past Medical History:  Diagnosis Date  . Diabetes mellitus   . Hyperlipidemia   . Hypertension   . MI, old   . Renal disorder    dialysls    PAST SURGICAL HISTORY:   Past Surgical History:  Procedure Laterality Date  . A/V FISTULAGRAM N/A 10/05/2017   Procedure: A/V Fistulagram;  Surgeon: Katha Cabal, MD;  Location: [facility];  Service: Cardiovascular;  Laterality: N/A;  . A/V SHUNTOGRAM N/A 10/05/2017   Procedure: A/V SHUNTOGRAM;  Surgeon: Katha Cabal, MD;  Location: [place] [facility];  Service: Cardiovascular;  Laterality: N/A;  . AV FISTULA PLACEMENT Left 10/02/2016   Procedure: INSERTION OF ARTERIOVENOUS (AV) GORE-TEX GRAFT ARM;  Surgeon: Waynetta Sandy, MD;  Location: [place];  Service: Vascular;  Laterality: Left;  . CARDIAC CATHETERIZATION N/A 08/15/2016   Procedure: Left Heart Cath and Coronary Angiography;  Surgeon: Yolonda Kida, MD;  Location: [place] [facility];   Service: Cardiovascular;  Laterality: N/A;  . CARDIAC CATHETERIZATION N/A 08/15/2016   Procedure: Coronary Stent Intervention;  Surgeon: Yolonda Kida, MD;  Location: [facility];  Service: Cardiovascular;  Laterality: N/A;  . CARDIAC CATHETERIZATION N/A 08/18/2016   Procedure: Right Heart Cath;  Surgeon: Jolaine Artist, MD;  Location: [place] [facility];  Service: Cardiovascular;  Laterality: N/A;  . CARDIAC CATHETERIZATION N/A 08/18/2016   Procedure: IABP Insertion;  Surgeon: Jolaine Artist, MD;  Location: [place] [facility];  Service: Cardiovascular;  Laterality: N/A;  . CARDIAC CATHETERIZATION Right 08/23/2016   Procedure: CENTRAL LINE INSERTION RIGHT SUBCLAVIAN;  Surgeon: Ivin Poot, MD;  Location: [place] [facility];  Service: Open Heart Surgery;  Laterality: Right;  . ENDOVEIN HARVEST OF GREATER SAPHENOUS VEIN Left 08/23/2016   Procedure: ENDOVEIN HARVEST OF GREATER SAPHENOUS VEIN;  Surgeon: Ivin Poot, MD;  Location: [place];  Service: Open Heart Surgery;  Laterality: Left;  . INSERTION OF DIALYSIS CATHETER Bilateral 08/31/2016   Procedure: INSERTION OF DIALYSIS CATHETER LEFT INTERNAL JUGULAR VEIN & INSERTION OF TRIPLE LUMEN RIGHT INTERNAL JUGULAR VEIN;  Surgeon: Angelia Mould, MD;  Location: [place];  Service: Vascular;  Laterality: Bilateral;  . INTRAOPERATIVE TRANSESOPHAGEAL ECHOCARDIOGRAM N/A 08/23/2016   Procedure: INTRAOPERATIVE TRANSESOPHAGEAL ECHOCARDIOGRAM;  Surgeon: Ivin Poot, MD;  Location: [place];  Service: Open Heart Surgery;  Laterality: N/A;  .  IR GENERIC HISTORICAL  11/13/2016   IR US GUIDE VASC ACCESS LEFT 11/13/2016 Corrie Mckusick, DO [facility]  . IR GENERIC HISTORICAL  11/13/2016   IR FLUORO GUIDE CV LINE LEFT 11/13/2016 Corrie Mckusick, DO [facility]  . IR GENERIC HISTORICAL  11/13/2016   IR GASTROSTOMY TUBE MOD SED 11/13/2016 Corrie Mckusick, DO [facility]  . MITRAL VALVE REPAIR N/A 08/23/2016   Procedure: MITRAL VALVE REPAIR (MVR) USING 25MM  EDWARDS MAGNA EASE BIOPROSTHESIS MITRAL  VALVE;  Surgeon: Ivin Poot, MD;  Location: [facility];  Service: Open Heart Surgery;  Laterality: N/A;  . tracheostomy reversal    . TRACHEOSTOMY TUBE PLACEMENT N/A 11/09/2016   Procedure: TRACHEOSTOMY;  Surgeon: Melissa Montane, MD;  Location: [place];  Service: ENT;  Laterality: N/A;  . VAGINAL DELIVERY     x 6    SOCIAL HISTORY:   Social History   Tobacco Use  . Smoking status: Never Smoker  . Smokeless tobacco: Never Used  Substance Use Topics  . Alcohol use: No    FAMILY HISTORY:   Family History  Problem Relation Age of Onset  . Hypertension Mother   . Diabetes Mother   . Breast cancer Sister   . Hypertension Father     DRUG ALLERGIES:  No Known Allergies  REVIEW OF SYSTEMS:  CONSTITUTIONAL: Positive for fever, chills and sweats.  Positive for fatigue and weakness.  EYES: Positive for watery and itchy eyes EARS, NOSE, AND THROAT: No tinnitus or ear pain. No sore throat.  Positive for runny nose RESPIRATORY: Positive for cough with whitish phlegm.  Positive for shortness of breath.occasional wheezing.  CARDIOVASCULAR: No chest pain.  GASTROINTESTINAL: No nausea, vomiting, diarrhea or abdominal pain. No blood in bowel movements GENITOURINARY: No hematuria.  ENDOCRINE: No polyuria, nocturia,  HEMATOLOGY: No anemia, easy bruising or bleeding SKIN: No rash or lesion. MUSCULOSKELETAL: No joint pain or arthritis.   NEUROLOGIC: No tingling, numbness, weakness.  PSYCHIATRY: No previous anxiety  MEDICATIONS AT HOME:   Prior to Admission medications   Medication Sig Start Date End Date Taking? Authorizing Provider  acetaminophen (TYLENOL) 325 MG tablet Take 650 mg by mouth. 04/06/17   [provider]  albuterol (PROVENTIL) (2.5 MG/3ML) 0.083% nebulizer solution Take 3 mLs (2.5 mg total) by nebulization 4 (four) times daily - after meals and at bedtime. Patient taking differently: Take 2.5 mg by nebulization 3 (three) times daily  as needed.  11/01/16   Love, Ivan Anchors, PA-C  ALPRAZolam (XANAX) 0.25 MG tablet Take 1 tablet (0.25 mg total) by mouth 2 (two) times daily as needed for anxiety. 10/24/19   Swayze, Ava, DO  amLODipine (NORVASC) 5 MG tablet Take 10 mg by mouth daily. 01/08/20   [provider]  apixaban (ELIQUIS) 2.5 MG TABS tablet Take 2.5 mg by mouth 2 (two) times daily.  06/22/17   [provider]  aspirin 81 MG EC tablet Take 1 tablet (81 mg total) by mouth daily. 10/24/19   Swayze, Ava, DO  atorvastatin (LIPITOR) 80 MG tablet Take 80 mg by mouth daily at 6 PM.     [provider]  B Complex-C-Folic Acid (RENA-VITE RX) 1 MG TABS Take 1 tablet by mouth daily. 09/01/19   [provider]  carvedilol (COREG) 25 MG tablet Take 25 mg by mouth 2 (two) times daily. 04/22/18   [provider]  escitalopram (LEXAPRO) 10 MG tablet Take 10 mg by mouth daily. 03/09/20   [provider]  furosemide (LASIX)  40 MG tablet Take 40 mg by mouth daily. ON mornings of dialysis, Monday, Wednesday, Friday    [provider]  gabapentin (NEURONTIN) 300 MG capsule Take 1 capsule (300 mg total) by mouth at bedtime. 04/04/20   Samuella Cota, MD  LEVEMIR FLEXTOUCH 100 UNIT/ML FlexPen Inject 13 Units into the skin at bedtime. 03/05/20   [provider]  lidocaine-prilocaine (EMLA) cream Apply 1 application topically every dialysis. 09/26/19   [provider]  midodrine (PROAMATINE) 5 MG tablet Take 5 mg by mouth 3 (three) times a week.    [provider]  olmesartan (BENICAR) 20 MG tablet Take 20 mg by mouth daily. 03/12/20   [provider]  pantoprazole (PROTONIX) 40 MG tablet Take 1 tablet (40 mg total) by mouth 2 (two) times daily. 11/01/16   Love, Ivan Anchors, PA-C  sevelamer carbonate (RENVELA) 800 MG tablet Take 1,600 mg by mouth 3 (three) times daily with meals.    [provider]  traZODone (DESYREL) 50 MG tablet Take 50 mg by mouth at  bedtime as needed for sleep.  04/04/18   [provider]      VITAL SIGNS:  Blood pressure (!) 141/71, pulse 73, temperature 99.5 F (37.5 C), temperature source Oral, resp. rate 17, height 5\' 2"  (1.575 m), weight 63.5 kg, SpO2 96 %.  PHYSICAL EXAMINATION:  GENERAL:  64 y.o.-year-old patient lying in the bed with no acute distress.  EYES: Pupils equal, round, reactive to light and accommodation. No scleral icterus.  Sclera erythematous. HEENT: Head atraumatic, normocephalic. Oropharynx and nasopharynx clear.  Tympanic membrane on the right slight erythema. NECK:  Supple, no jugular venous distention. No thyroid enlargement, no tenderness.  LUNGS: Decreased breath sounds bilaterally, no wheezing, rales,rhonchi or crepitation. No use of accessory muscles of respiration.  CARDIOVASCULAR: S1, S2 normal. No murmurs, rubs, or gallops.  ABDOMEN: Soft, nontender, nondistended. Bowel sounds present. No organomegaly or mass.  EXTREMITIES: No pedal edema, cyanosis, or clubbing.  NEUROLOGIC: Cranial nerves II through XII are intact. Muscle strength 5/5 in all extremities. Sensation intact. Gait not checked.  PSYCHIATRIC: The patient is alert and oriented x 3.  SKIN: No rash, lesion, or ulcer.   LABORATORY PANEL:   CBC Recent Labs  Lab 06/25/20 1251  WBC 7.8  HGB 13.8  HCT 42.0  PLT 242   ------------------------------------------------------------------------------------------------------------------  Chemistries  Recent Labs  Lab 06/25/20 1251  NA 136  K 4.4  CL 93*  CO2 29  GLUCOSE 194*  BUN 67*  CREATININE 8.07*  CALCIUM 9.5  AST 20  ALT 14  ALKPHOS 76  BILITOT 1.4*   ------------------------------------------------------------------------------------------------------------------    RADIOLOGY:  DG Chest 2 View  Result Date: 06/25/2020 CLINICAL DATA:  Cough and shortness of breath EXAM: CHEST - 2 VIEW COMPARISON:  04/03/2019 FINDINGS: Cardiac shadow is  enlarged. Postsurgical changes are noted. Tracheostomy tube is seen in satisfactory position. The lungs are well aerated bilaterally. No focal infiltrate or effusion is seen. Prior vascular stenting in the left arm is noted. IMPRESSION: Stable cardiomegaly. No acute abnormality noted. Electronically Signed   By: Inez Catalina M.D.   On: 06/25/2020 11:32    EKG:   Ordered by me  IMPRESSION AND PLAN:   1.  Acute hypoxic respiratory failure.  Patient had a pulse ox of 86% on room air.  Trach collar ordered. 2.  Shortness of breath and low-grade temperature.  Chest x-ray negative for pneumonia.  Covid test negative.  We will get  a CT scan of the chest to rule out pulmonary embolism will also look at the lung fields a little bit better.  ER physician ordered empiric antibiotics with vancomycin and cefepime.  We will get sputum culture from tracheostomy. 3.  End-stage renal disease.  Did not have dialysis today.  We will get a CT scan of the chest and dialysis as per nephrology whether it later today or tomorrow. 4.  Type 2 diabetes mellitus with hyperlipidemia.  Continue Levemir insulin and sliding scale and atorvastatin 5.  Essential hypertension on Coreg, amlodipine and olmesartan 6.  History of mitral valve repair, history of CAD on aspirin, atorvastatin and coreg. 7.  Patient on low-dose Eliquis.  All the records, laboratory and radiological data are reviewed and case discussed with ED provider. Management plans discussed with the patient, family and they are in agreement.  CODE STATUS: full code  TOTAL TIME TAKING CARE OF THIS PATIENT: 50 minutes.    Loletha Grayer M.D on 06/25/2020 at 5:45 PM  Between 7am to 6pm - Pager - [phone]  After 6pm call admission pager [phone]  [facility]  CC: Primary care physician; Leta Speller, MD

## 2020-06-25 NOTE — ED Provider Notes (Signed)
[facility] Emergency Department Provider Note    First MD Initiated Contact with Patient 06/25/20 1245     (approximate)  I have reviewed the triage vital signs and the nursing notes.   HISTORY  Chief Complaint Cough and Shortness of Breath    HPI Deborah [name] is a 64 y.o. female with the below listed past medical history on dialysis with last dialysis session being Wednesday presents to the ER for 24 hours of congestion cough and fever to 100.6.  States that she otherwise feels well.  No nausea or vomiting.  No diarrhea.  Is been primarily having nasal congestion.  No recent antibiotics.  Does not feel any worsening shortness of breath.  No chest pain.    Past Medical History:  Diagnosis Date  . Diabetes mellitus   . Hyperlipidemia   . Hypertension   . MI, old   . Renal disorder    dialysls   Family History  Problem Relation Age of Onset  . Hypertension Mother   . Diabetes Mother   . Breast cancer Sister   . Hypertension Father    Past Surgical History:  Procedure Laterality Date  . A/V FISTULAGRAM N/A 10/05/2017   Procedure: A/V Fistulagram;  Surgeon: Katha Cabal, MD;  Location: [place] [facility];  Service: Cardiovascular;  Laterality: N/A;  . A/V SHUNTOGRAM N/A 10/05/2017   Procedure: A/V SHUNTOGRAM;  Surgeon: Katha Cabal, MD;  Location: [place] [facility];  Service: Cardiovascular;  Laterality: N/A;  . AV FISTULA PLACEMENT Left 10/02/2016   Procedure: INSERTION OF ARTERIOVENOUS (AV) GORE-TEX GRAFT ARM;  Surgeon: Waynetta Sandy, MD;  Location: [place];  Service: Vascular;  Laterality: Left;  . CARDIAC CATHETERIZATION N/A 08/15/2016   Procedure: Left Heart Cath and Coronary Angiography;  Surgeon: Yolonda Kida, MD;  Location: [facility];  Service: Cardiovascular;  Laterality: N/A;  . CARDIAC CATHETERIZATION N/A 08/15/2016   Procedure: Coronary Stent Intervention;  Surgeon: Yolonda Kida, MD;   Location: [facility] [facility];  Service: Cardiovascular;  Laterality: N/A;  . CARDIAC CATHETERIZATION N/A 08/18/2016   Procedure: Right Heart Cath;  Surgeon: Jolaine Artist, MD;  Location: [facility];  Service: Cardiovascular;  Laterality: N/A;  . CARDIAC CATHETERIZATION N/A 08/18/2016   Procedure: IABP Insertion;  Surgeon: Jolaine Artist, MD;  Location: [place] [facility];  Service: Cardiovascular;  Laterality: N/A;  . CARDIAC CATHETERIZATION Right 08/23/2016   Procedure: CENTRAL LINE INSERTION RIGHT SUBCLAVIAN;  Surgeon: Ivin Poot, MD;  Location: [place];  Service: Open Heart Surgery;  Laterality: Right;  . ENDOVEIN HARVEST OF GREATER SAPHENOUS VEIN Left 08/23/2016   Procedure: ENDOVEIN HARVEST OF GREATER SAPHENOUS VEIN;  Surgeon: Ivin Poot, MD;  Location: [place];  Service: Open Heart Surgery;  Laterality: Left;  . INSERTION OF DIALYSIS CATHETER Bilateral 08/31/2016   Procedure: INSERTION OF DIALYSIS CATHETER LEFT INTERNAL JUGULAR VEIN & INSERTION OF TRIPLE LUMEN RIGHT INTERNAL JUGULAR VEIN;  Surgeon: Angelia Mould, MD;  Location: [place];  Service: Vascular;  Laterality: Bilateral;  . INTRAOPERATIVE TRANSESOPHAGEAL ECHOCARDIOGRAM N/A 08/23/2016   Procedure: INTRAOPERATIVE TRANSESOPHAGEAL ECHOCARDIOGRAM;  Surgeon: Ivin Poot, MD;  Location: [place];  Service: Open Heart Surgery;  Laterality: N/A;  . IR GENERIC HISTORICAL  11/13/2016   IR US GUIDE VASC ACCESS LEFT 11/13/2016 Corrie Mckusick, DO [facility]  . IR GENERIC HISTORICAL  11/13/2016   IR FLUORO GUIDE CV LINE LEFT 11/13/2016 Corrie Mckusick,  DO [facility]  . IR GENERIC HISTORICAL  11/13/2016   IR GASTROSTOMY TUBE MOD SED 11/13/2016 Corrie Mckusick, DO [facility]  . MITRAL VALVE REPAIR N/A 08/23/2016   Procedure: MITRAL VALVE REPAIR (MVR) USING 25MM EDWARDS MAGNA EASE BIOPROSTHESIS MITRAL  VALVE;  Surgeon: Ivin Poot, MD;  Location: [place];  Service: Open Heart Surgery;  Laterality: N/A;  . tracheostomy  reversal    . TRACHEOSTOMY TUBE PLACEMENT N/A 11/09/2016   Procedure: TRACHEOSTOMY;  Surgeon: Melissa Montane, MD;  Location: [place];  Service: ENT;  Laterality: N/A;  . VAGINAL DELIVERY     x 6   Patient Active Problem List   Diagnosis Date Noted  . Shortness of breath   . Fever   . Acute respiratory failure ([place]) 04/02/2020  . Acute CHF (congestive heart failure) ([place]) 10/17/2019  . Diabetic neuropathy ([place]) 10/16/2017  . Tracheobronchitis 10/11/2017  . Hemorrhage from arteriovenous dialysis graft ([place]) 10/05/2017  . Sepsis ([place]) 09/08/2017  . Adjustment disorder with anxious mood 12/09/2016  . Bacteremia due to Staphylococcus aureus   . Staphylococcus aureus bacteremia with sepsis ([place])   . Type 2 diabetes mellitus with hyperlipidemia ([place])   . ST elevation myocardial infarction involving left main coronary artery ([place])   . Palliative care encounter   . Advance care planning   . Vocal cord dysfunction   . Palliative care by specialist   . Goals of care, counseling/discussion   . Bacteremia   . End-stage renal disease on hemodialysis ([place])   . Paroxysmal atrial fibrillation ([name])   . Heart attack ([place])   . Uncontrolled type 2 diabetes mellitus with complication, without long-term current use of insulin ([place])   . Staphylococcus aureus bacteremia 11/12/2016  . Vocal cord paralysis   . Respiratory distress 11/02/2016  . Chronic anticoagulation   . Acute lower UTI   . Labile blood glucose   . Leukocytosis   . Subtherapeutic international normalized ratio (INR)   . Supratherapeutic INR   . Cough   . Oropharyngeal dysphagia   . Uncontrolled type 2 diabetes mellitus with complication, with long-term current use of insulin ([place])   . Physical debility 10/11/2016  . Metabolic encephalopathy 46/50/3546  . Debility   . ESRD (end stage renal disease) ([place])   . Type 2 diabetes mellitus with complication, with long-term current use of insulin ([place])   . Anemia of chronic disease   .  Coronary artery disease involving native coronary artery of native heart without angina pectoris   . Acute on chronic respiratory failure with hypoxemia ([place])   . Atrial flutter ([place])   . Persistent cough   . Dysphonia   . Sleep disturbance   . Diabetes mellitus type 2 in nonobese ([name])   . Stage 3 chronic kidney disease   . NSTEMI (non-ST elevated myocardial infarction) ([place])   . Tachypnea   . Hyponatremia   . Lymphocytosis   . Acute blood loss anemia   . Pressure injury of skin 09/18/2016  . History of tracheostomy   . Delirium   . Encounter for attention to tracheostomy ([place])   . History of MVR with cardiopulmonary bypass   . Systolic congestive heart failure ([place])   . HCAP (healthcare-associated pneumonia)   . PAF (paroxysmal atrial fibrillation) ([place])   . Acute systolic congestive heart failure ([place])   . AKI (acute kidney injury) ([place])   . Abnormal LFTs   . S/P MVR (mitral valve replacement)   . Mitral regurgitation 08/23/2016  .  Acute on chronic diastolic heart failure ([place]) 08/18/2016  . Acute pulmonary edema ([name])   . Respiratory failure ([place]) 08/17/2016  . Cardiogenic shock ([place]) 08/17/2016  . Acute hypoxemic respiratory failure ([place])   . STEMI (ST elevation myocardial infarction) ([place]) 08/15/2016  . Noncompliance 01/25/2015  . Bereavement 07/09/2014  . Low back pain 02/17/2014  . Cervical cancer screening 11/20/2013  . Routine general medical examination at a health care facility 10/14/2013  . Chronic kidney disease, stage 3 06/14/2012  . Hypertension 03/08/2012  . Diabetes mellitus type 2, controlled 03/08/2012  . Hyperlipidemia 03/08/2012      Prior to Admission medications   Medication Sig Start Date End Date Taking? Authorizing Provider  albuterol (PROVENTIL) (2.5 MG/3ML) 0.083% nebulizer solution Take 3 mLs (2.5 mg total) by nebulization 4 (four) times daily - after meals and at bedtime. Patient taking differently: Take 2.5 mg by nebulization 3 (three)  times daily as needed.  11/01/16  Yes Love, Ivan Anchors, PA-C  amLODipine (NORVASC) 5 MG tablet Take 10 mg by mouth daily. 01/08/20  Yes [provider]  apixaban (ELIQUIS) 2.5 MG TABS tablet Take 2.5 mg by mouth 2 (two) times daily.  06/22/17  Yes [provider]  aspirin 81 MG EC tablet Take 1 tablet (81 mg total) by mouth daily. 10/24/19  Yes Swayze, Ava, DO  atorvastatin (LIPITOR) 80 MG tablet Take 80 mg by mouth daily at 6 PM.    Yes [provider]  B Complex-C-Folic Acid (RENA-VITE RX) 1 MG TABS Take 1 tablet by mouth daily. 09/01/19  Yes [provider]  carvedilol (COREG) 25 MG tablet Take 25 mg by mouth 2 (two) times daily. 04/22/18  Yes [provider]  furosemide (LASIX) 40 MG tablet Take 40 mg by mouth daily. ON NON dialysis days, Sunday, Tuesday, Thursday, Saturday   Yes [provider]  gabapentin (NEURONTIN) 300 MG capsule Take 1 capsule (300 mg total) by mouth at bedtime. 04/04/20  Yes Samuella Cota, MD  LEVEMIR FLEXTOUCH 100 UNIT/ML FlexPen Inject 13 Units into the skin at bedtime. 03/05/20  Yes [provider]  midodrine (PROAMATINE) 5 MG tablet Take 5 mg by mouth 3 (three) times a week.   Yes [provider]  olmesartan (BENICAR) 20 MG tablet Take 20 mg by mouth daily. 03/12/20  Yes [provider]  pantoprazole (PROTONIX) 40 MG tablet Take 1 tablet (40 mg total) by mouth 2 (two) times daily. 11/01/16  Yes Love, Ivan Anchors, PA-C  sevelamer carbonate (RENVELA) 800 MG tablet Take 1,600 mg by mouth 3 (three) times daily with meals.   Yes [provider]  traZODone (DESYREL) 50 MG tablet Take 50 mg by mouth 3 (three) times a week. Take on Dialysis days 04/04/18  Yes [provider]  acetaminophen (TYLENOL) 325 MG tablet Take 650 mg by mouth. 04/06/17   [provider]  lidocaine-prilocaine (EMLA) cream Apply 1 application topically every dialysis. 09/26/19   [provider]     Allergies Patient has no known allergies.    Social History Social History   Tobacco Use  . Smoking status: Never Smoker  . Smokeless tobacco: Never Used  Substance Use Topics  . Alcohol use: No  . Drug use: No    Review of Systems Patient denies headaches, rhinorrhea, blurry vision, numbness, shortness of breath, chest pain, edema, cough, abdominal pain, nausea, vomiting, diarrhea, dysuria, fevers, rashes or hallucinations unless otherwise stated above in HPI. ____________________________________________   PHYSICAL EXAM:  VITAL SIGNS: Vitals:   06/25/20 1817 06/25/20 1820  BP:    Pulse: 75 72  Resp:    Temp:    SpO2: 98% 99%    Constitutional: Alert and oriented.  Eyes: Conjunctivae are normal.  Head: Atraumatic. Nose: No congestion/rhinnorhea. Mouth/Throat: Mucous membranes are moist.   Neck: No stridor. Painless ROM. Tracheostomy c/d/i Cardiovascular: Normal rate, regular rhythm. Holosystolic murmur.  Good peripheral circulation. Respiratory: Normal respiratory effort.  No retractions. Lungs CTAB. Gastrointestinal: Soft and nontender. No distention. No abdominal bruits. No CVA tenderness. Genitourinary:  Musculoskeletal: No lower extremity tenderness nor edema.  No joint effusions. Neurologic:  Normal speech and language. No gross focal neurologic deficits are appreciated. No facial droop Skin:  Skin is warm, dry and intact. No rash noted. Psychiatric: Mood and affect are normal. Speech and behavior are normal.  ____________________________________________   LABS (all labs ordered are listed, but only abnormal results are displayed)  Results for orders placed or performed during the hospital encounter of 06/25/20 (from the past 24 hour(s))  CBC     Status: Abnormal   Collection Time: 06/25/20 12:51 PM  Result Value Ref Range   WBC 7.8 4.0 - 10.5 K/uL   RBC 4.91 3.87 - 5.11 MIL/uL   Hemoglobin 13.8 12.0 - 15.0 g/dL   HCT 42.0 36 - 46 %   MCV 85.5  80.0 - 100.0 fL   MCH 28.1 26.0 - 34.0 pg   MCHC 32.9 30.0 - 36.0 g/dL   RDW 16.9 (H) 11.5 - 15.5 %   Platelets 242 150 - 400 K/uL   nRBC 0.0 0.0 - 0.2 %  Comprehensive metabolic panel     Status: Abnormal   Collection Time: 06/25/20 12:51 PM  Result Value Ref Range   Sodium 136 135 - 145 mmol/L   Potassium 4.4 3.5 - 5.1 mmol/L   Chloride 93 (L) 98 - 111 mmol/L   CO2 29 22 - 32 mmol/L   Glucose, Bld 194 (H) 70 - 99 mg/dL   BUN 67 (H) 8 - 23 mg/dL   Creatinine, Ser 8.07 (H) 0.44 - 1.00 mg/dL   Calcium 9.5 8.9 - 10.3 mg/dL   Total Protein 8.4 (H) 6.5 - 8.1 g/dL   Albumin 4.1 3.5 - 5.0 g/dL   AST 20 15 - 41 U/L   ALT 14 0 - 44 U/L   Alkaline Phosphatase 76 38 - 126 U/L   Total Bilirubin 1.4 (H) 0.3 - 1.2 mg/dL   GFR calc non Af Amer 5 (L) >60 mL/min   GFR calc Af Amer 6 (L) >60 mL/min   Anion gap 14 5 - 15  SARS Coronavirus 2 by RT PCR (hospital order, performed in [facility] hospital lab) Nasopharyngeal Nasopharyngeal Swab     Status: None   Collection Time: 06/25/20  2:34 PM   Specimen: Nasopharyngeal Swab  Result Value Ref Range   SARS Coronavirus 2 NEGATIVE NEGATIVE  Lactic acid, plasma     Status: None   Collection Time: 06/25/20  5:00 PM  Result Value Ref Range   Lactic Acid, Venous 0.8 0.5 - 1.9 mmol/L   ____________________________________________ ____________________________________________  RADIOLOGY  I personally reviewed all radiographic images ordered to evaluate for the above acute complaints and reviewed radiology reports and findings.  These findings were personally discussed with the patient.  Please see medical record for radiology report.  ____________________________________________   PROCEDURES  Procedure(s) performed:  Procedures    Critical Care performed: no ____________________________________________  INITIAL IMPRESSION / ASSESSMENT AND PLAN / ED COURSE  Pertinent labs & imaging results that were available during my care of the  patient were reviewed by me and considered in my medical decision making (see chart for details).   DDX: pna, chf, copd, electrolyte abn, uri, covid  Deborah [name] is a 64 y.o. who presents to the ED with sx as described above.  Patient is AFVSS in ED. Exam as above. Given current presentation have considered the above differential.  The patient will be placed on continuous pulse oximetry and telemetry for monitoring.  Laboratory evaluation will be sent to evaluate for the above complaints.     Clinical Course as of Jun 25 1828  Fri Jun 25, 2020  1702 She was found to have desaturations with good waveform to the low 80s and was placed on supplemental oxygen.  She does not wear home oxygen.  Covid is negative.  Given her fever cough and new hypoxia will cover for pneumonia.  Will order blood cultures.  We will add on lactate.  Does not seem consistent with flash pulmonary edema CHF.  Suspect infectious process.  Have discussed with the patient and available family all diagnostics and treatments performed thus far and all questions were answered to the best of my ability. The patient demonstrates understanding and agreement with plan.    [PR]    Clinical Course User Index [PR] Merlyn Lot, MD    The patient was evaluated in Emergency Department today for the symptoms described in the history of present illness. He/she was evaluated in the context of the global COVID-19 pandemic, which necessitated consideration that the patient might be at risk for infection with the SARS-CoV-2 virus that causes COVID-19. Institutional protocols and algorithms that pertain to the evaluation of patients at risk for COVID-19 are in a state of rapid change based on information released by regulatory bodies including the CDC and federal and state organizations. These policies and algorithms were followed during the patient's care in the ED.  As part of my medical decision making, I reviewed the following data  within the [place] notes reviewed and incorporated, Labs reviewed, notes from prior ED visits and [place] Controlled Substance Database   ____________________________________________   FINAL CLINICAL IMPRESSION(S) / ED DIAGNOSES  Final diagnoses:  Acute respiratory failure with hypoxia ([place])      NEW MEDICATIONS STARTED DURING THIS VISIT:  New Prescriptions   No medications on file     Note:  This document was prepared using Dragon voice recognition software and may include unintentional dictation errors.     Merlyn Lot, MD 06/25/20 [phone]

## 2020-06-25 NOTE — Progress Notes (Signed)
PHARMACY -  BRIEF ANTIBIOTIC NOTE   Pharmacy has received consult(s) for Vancomycin and Cefepime from an ED provider.  The patient's profile has been reviewed for ht/wt/allergies/indication/available labs.    One time order(s) placed for Vancomycin and Cefepime by ED provider  Further antibiotics/pharmacy consults should be ordered by admitting physician if indicated.                       Thank you, Vira Blanco 06/25/2020  5:35 PM

## 2020-06-25 NOTE — ED Notes (Signed)
Heads up given to floor

## 2020-06-26 LAB — GLUCOSE, CAPILLARY
Glucose-Capillary: 85 mg/dL (ref 70–99)
Glucose-Capillary: 117 mg/dL — ABNORMAL HIGH (ref 70–99)
Glucose-Capillary: 134 mg/dL — ABNORMAL HIGH (ref 70–99)
Glucose-Capillary: 311 mg/dL — ABNORMAL HIGH (ref 70–99)

## 2020-06-26 LAB — BASIC METABOLIC PANEL
Anion gap: 14 (ref 5–15)
BUN: 77 mg/dL — ABNORMAL HIGH (ref 8–23)
CO2: 26 mmol/L (ref 22–32)
Calcium: 8.6 mg/dL — ABNORMAL LOW (ref 8.9–10.3)
Chloride: 93 mmol/L — ABNORMAL LOW (ref 98–111)
Creatinine, Ser: 9.01 mg/dL — ABNORMAL HIGH (ref 0.44–1.00)
GFR calc Af Amer: 5 mL/min — ABNORMAL LOW (ref >60)
GFR calc non Af Amer: 4 mL/min — ABNORMAL LOW (ref >60)
Glucose, Bld: 171 mg/dL — ABNORMAL HIGH (ref 70–99)
Potassium: 4.4 mmol/L (ref 3.5–5.1)
Sodium: 133 mmol/L — ABNORMAL LOW (ref 135–145)

## 2020-06-26 LAB — CBC
HCT: 34.8 % — ABNORMAL LOW (ref 36.0–46.0)
Hemoglobin: 11.8 g/dL — ABNORMAL LOW (ref 12.0–15.0)
MCH: 28.4 pg (ref 26.0–34.0)
MCHC: 33.9 g/dL (ref 30.0–36.0)
MCV: 83.9 fL (ref 80.0–100.0)
Platelets: 210 10*3/uL (ref 150–400)
RBC: 4.15 MIL/uL (ref 3.87–5.11)
RDW: 17.2 % — ABNORMAL HIGH (ref 11.5–15.5)
WBC: 7.2 10*3/uL (ref 4.0–10.5)
nRBC: 0 % (ref 0.0–0.2)

## 2020-06-26 MED ORDER — INSULIN ASPART 100 UNIT/ML ~~LOC~~ SOLN
0.0000 [IU] | Freq: Three times a day (TID) | SUBCUTANEOUS | Status: DC
  Administered 2020-06-26: 1 [IU] via SUBCUTANEOUS
  Filled 2020-06-26: qty 1

## 2020-06-26 MED ORDER — CHLORHEXIDINE GLUCONATE 0.12 % MT SOLN
15.0000 mL | Freq: Two times a day (BID) | OROMUCOSAL | Status: DC
  Administered 2020-06-26 – 2020-06-27 (×2): 15 mL via OROMUCOSAL
  Filled 2020-06-26 (×3): qty 15

## 2020-06-26 MED ORDER — VANCOMYCIN HCL IN DEXTROSE 750-5 MG/150ML-% IV SOLN
750.0000 mg | Freq: Once | INTRAVENOUS | Status: DC
  Filled 2020-06-26: qty 150

## 2020-06-26 MED ORDER — VANCOMYCIN HCL 750 MG/150ML IV SOLN
750.0000 mg | Freq: Once | INTRAVENOUS | Status: DC
  Filled 2020-06-26: qty 150

## 2020-06-26 MED ORDER — ORAL CARE MOUTH RINSE
15.0000 mL | Freq: Two times a day (BID) | OROMUCOSAL | Status: DC
  Administered 2020-06-26: 15 mL via OROMUCOSAL

## 2020-06-26 MED ORDER — INSULIN ASPART 100 UNIT/ML ~~LOC~~ SOLN
0.0000 [IU] | Freq: Every day | SUBCUTANEOUS | Status: DC
  Administered 2020-06-26: 4 [IU] via SUBCUTANEOUS
  Filled 2020-06-26: qty 1

## 2020-06-26 MED ORDER — CHLORHEXIDINE GLUCONATE CLOTH 2 % EX PADS
6.0000 | MEDICATED_PAD | Freq: Every day | CUTANEOUS | Status: DC
  Administered 2020-06-26: 09:00:00 6 via TOPICAL

## 2020-06-26 NOTE — Progress Notes (Signed)
Patient seen earlier during rounds in no distress. Son at bedside suctioning patient's trach.  Patient in no distress at the time. Pleasant and looking well. Son with good technique. Moderate amount of white sputum noted with pass of catheter. Patient tolerated interventions well. BBS ess clear post suction. Son/patient performs trach care. Tolerating trach collar well.

## 2020-06-26 NOTE — Progress Notes (Signed)
[facility]  ROUNDING NOTE   Subjective:  Patient well-known to [place] from admission in April. She normally follows with [facility] nephrologist at [facility]. Underwent dialysis treatment today. Came in with cough and shortness of breath. Normally dialyzes on MWF schedule.  Objective:  Vital signs in last 24 hours:  Temp:  [97.6 F (36.4 C)-98.8 F (37.1 C)] 97.9 F (36.6 C) (07/24 1040) Pulse Rate:  [70-79] 74 (07/24 1330) Resp:  [17] 17 (07/24 0847) BP: (125-169)/(56-84) 154/65 (07/24 1330) SpO2:  [86 %-100 %] 99 % (07/24 1040) Weight:  [65.2 kg] 65.2 kg (07/23 2126)  Weight change:  Filed Weights   06/25/20 1101 06/25/20 2126  Weight: 63.5 kg 65.2 kg    Intake/Output: I/O last 3 completed shifts: In: 340 [P.O.:240; IV Piggyback:100] Out: -    Intake/Output this shift:  No intake/output data recorded.  Physical Exam: General: No acute distress  Head: Normocephalic, atraumatic. Moist oral mucosal membranes  Eyes: Anicteric  Neck: Tracheostomy in place  Lungs:  Scattered rhonchi, normal effort  Heart: S1S2 no rubs  Abdomen:  Soft, nontender, bowel sounds present  Extremities: Trace peripheral edema.  Neurologic: Awake, alert  Skin: No lesions  Access: LUE AVG    Basic Metabolic Panel: Recent Labs  Lab 06/25/20 1251 06/26/20 0320  NA 136 133*  K 4.4 4.4  CL 93* 93*  CO2 29 26  GLUCOSE 194* 171*  BUN 67* 77*  CREATININE 8.07* 9.01*  CALCIUM 9.5 8.6*    Liver Function Tests: Recent Labs  Lab 06/25/20 1251  AST 20  ALT 14  ALKPHOS 76  BILITOT 1.4*  PROT 8.4*  ALBUMIN 4.1   No results for input(s): LIPASE, AMYLASE in the last 168 hours. No results for input(s): AMMONIA in the last 168 hours.  CBC: Recent Labs  Lab 06/25/20 1251 06/26/20 0320  WBC 7.8 7.2  HGB 13.8 11.8*  HCT 42.0 34.8*  MCV 85.5 83.9  PLT 242 210    Cardiac Enzymes: No results for input(s): CKTOTAL, CKMB, CKMBINDEX, TROPONINI in the last 168  hours.  BNP: Invalid input(s): POCBNP  CBG: Recent Labs  Lab 06/25/20 2233 06/26/20 0848  GLUCAP 243* 84    Microbiology: Results for orders placed or performed during the hospital encounter of 06/25/20  SARS Coronavirus 2 by RT PCR (hospital order, performed in [facility] hospital lab) Nasopharyngeal Nasopharyngeal Swab     Status: None   Collection Time: 06/25/20  2:34 PM   Specimen: Nasopharyngeal Swab  Result Value Ref Range Status   SARS Coronavirus 2 NEGATIVE NEGATIVE Final    Comment: (NOTE) SARS-CoV-2 target nucleic acids are NOT DETECTED.  The SARS-CoV-2 RNA is generally detectable in upper and lower respiratory specimens during the acute phase of infection. The lowest concentration of SARS-CoV-2 viral copies this assay can detect is 250 copies / mL. A negative result does not preclude SARS-CoV-2 infection and should not be used as the sole basis for treatment or other patient management decisions.  A negative result may occur with improper specimen collection / handling, submission of specimen other than nasopharyngeal swab, presence of viral mutation(s) within the areas targeted by this assay, and inadequate number of viral copies (<250 copies / mL). A negative result must be combined with clinical observations, patient history, and epidemiological information.  Fact Sheet for Patients:   StrictlyIdeas.no  Fact Sheet for Healthcare Providers: BankingDealers.co.za  This test is not yet approved or  cleared by the [place] FDA and has  been authorized for detection and/or diagnosis of SARS-CoV-2 by FDA under an Emergency Use Authorization (EUA).  This EUA will remain in effect (meaning this test can be used) for the duration of the COVID-19 declaration under Section 564(b)(1) of the Act, 21 U.S.C. section 360bbb-3(b)(1), unless the authorization is terminated or revoked sooner.  Performed at [facility], [address] [place], [place] [address]   Blood culture (routine x 2)     Status: None (Preliminary result)   Collection Time: 06/25/20  5:00 PM   Specimen: BLOOD  Result Value Ref Range Status   Specimen Description BLOOD LEFT HAND  Final   Special Requests   Final    BOTTLES DRAWN AEROBIC AND ANAEROBIC Blood Culture adequate volume   Culture   Final    NO GROWTH < 24 HOURS Performed at [facility], [address], [place], [place] [address]    Report Status PENDING  Incomplete  Blood culture (routine x 2)     Status: None (Preliminary result)   Collection Time: 06/25/20  5:00 PM   Specimen: BLOOD  Result Value Ref Range Status   Specimen Description BLOOD RT HAND  Final   Special Requests   Final    BOTTLES DRAWN AEROBIC AND ANAEROBIC Blood Culture adequate volume   Culture   Final    NO GROWTH < 24 HOURS Performed at [facility], [address] [place], [place] [address]    Report Status PENDING  Incomplete    Coagulation Studies: No results for input(s): LABPROT, INR in the last 72 hours.  Urinalysis: No results for input(s): COLORURINE, LABSPEC, PHURINE, GLUCOSEU, HGBUR, BILIRUBINUR, KETONESUR, PROTEINUR, UROBILINOGEN, NITRITE, LEUKOCYTESUR in the last 72 hours.  Invalid input(s): APPERANCEUR    Imaging: DG Chest 2 View  Result Date: 06/25/2020 CLINICAL DATA:  Cough and shortness of breath EXAM: CHEST - 2 VIEW COMPARISON:  04/03/2019 FINDINGS: Cardiac shadow is enlarged. Postsurgical changes are noted. Tracheostomy tube is seen in satisfactory position. The lungs are well aerated bilaterally. No focal infiltrate or effusion is seen. Prior vascular stenting in the left arm is noted. IMPRESSION: Stable cardiomegaly. No acute abnormality noted. Electronically Signed   By: Inez Catalina M.D.   On: 06/25/2020 11:32   CT ANGIO CHEST PE W OR WO CONTRAST  Result Date: 06/25/2020 CLINICAL DATA:  Fever.  Congestion.  Cough. EXAM: CT ANGIOGRAPHY  CHEST WITH CONTRAST TECHNIQUE: Multidetector CT imaging of the chest was performed using the standard protocol during bolus administration of intravenous contrast. Multiplanar CT image reconstructions and MIPs were obtained to evaluate the vascular anatomy. CONTRAST:  70mL OMNIPAQUE IOHEXOL 350 MG/ML SOLN COMPARISON:  November 18, 2016 FINDINGS: Cardiovascular: Contrast injection is sufficient to demonstrate satisfactory opacification of the pulmonary arteries to the segmental level. There is no pulmonary embolus or evidence of right heart strain. The size of the main pulmonary artery is enlarged, measuring 3.5 cm. Moderate cardiomegaly. There are atherosclerotic changes of the thoracic aorta without evidence for a thoracic aortic aneurysm. Mediastinum/Nodes: --mild mediastinal adenopathy is noted. --mild hilar adenopathy is noted. -- No axillary lymphadenopathy. -- No supraclavicular lymphadenopathy. -- Normal thyroid gland where visualized. -  Unremarkable esophagus. Lungs/Pleura: The tracheostomy tube terminates in the trachea. Bibasilar atelectasis and scarring is again noted. There is no large focal infiltrate. The lung volumes are low. There are trace bilateral pleural effusions. There is no pneumothorax. Upper Abdomen: Contrast bolus timing is not optimized for evaluation of the abdominal organs. The visualized portions  of the organs of the upper abdomen are normal. Musculoskeletal: No chest wall abnormality. No bony spinal canal stenosis. Review of the MIP images confirms the above findings. IMPRESSION: 1. No evidence for acute pulmonary embolus. 2. Low lung volumes with bibasilar atelectasis and scarring. 3. Trace bilateral pleural effusions. 4. Enlarged size of the main pulmonary artery, which can be seen in patients with elevated pulmonary artery pressures. 5. Cardiomegaly. Aortic Atherosclerosis (ICD10-I70.0). Electronically Signed   By: Constance Holster M.D.   On: 06/25/2020 20:53     Medications:    . ceFEPime (MAXIPIME) IV    . [START ON 06/28/2020] vancomycin    . vancomycin     . albuterol  2.5 mg Nebulization TID PC & HS  . amLODipine  10 mg Oral Daily  . apixaban  2.5 mg Oral BID  . aspirin EC  81 mg Oral Daily  . atorvastatin  80 mg Oral q1800  . carvedilol  25 mg Oral BID  . chlorhexidine  15 mL Mouth Rinse BID  . Chlorhexidine Gluconate Cloth  6 each Topical Q0600  . furosemide  40 mg Oral Daily  . gabapentin  300 mg Oral QHS  . insulin aspart  0-5 Units Subcutaneous QHS  . insulin aspart  0-9 Units Subcutaneous TID WC  . insulin detemir  13 Units Subcutaneous QHS  . irbesartan  150 mg Oral Daily  . mouth rinse  15 mL Mouth Rinse q12n4p  . midodrine  5 mg Oral Once per day on Mon Wed Fri  . multivitamin  1 tablet Oral Daily  . pantoprazole  40 mg Oral Daily  . sevelamer carbonate  1,600 mg Oral TID WC  . traZODone  50 mg Oral Once per day on Mon Wed Fri   acetaminophen **OR** acetaminophen, lidocaine-prilocaine, ondansetron **OR** ondansetron (ZOFRAN) IV  Assessment/ Plan:  65 y.o. female with past medical history of ESRD on HD MWF, chronic tracheostomy, hypertension, diabetes mellitus type 2, peripheral neuropathy, coronary artery disease, mitral valve repair, atrial fibrillation admitted with acute respiratory failure.  [facility] nephrology/MWF/[facility]/left upper extremity AV access  1.  ESRD on HD MWF.  Patient did not undergo hemodialysis yesterday.  Therefore she underwent dialysis treatment today and tolerated well.  Next Alysis treatment scheduled for Monday.  2.  Acute respiratory failure.  CT chest reviewed.  Further evaluation management per hospitalist.  3.  Anemia of chronic kidney disease. Lab Results  Component Value Date   HGB 11.8 (L) 06/26/2020  Hold off on Epogen for now.  4.  Secondary hyperparathyroidism. Lab Results  Component Value Date   PTH 270 (H) 10/03/2017   CALCIUM 8.6 (L) 06/26/2020   CAION 1.09 (L) 09/01/2016   PHOS  4.9 (H) 04/03/2020  Maintain the patient on Renvela 2 tablets p.o. 3 times daily with meals.     LOS: 1 Deborah [name] 7/24/20212:04 PM

## 2020-06-26 NOTE — Progress Notes (Signed)
HD assessment.  

## 2020-06-26 NOTE — Progress Notes (Signed)
HD COmplete

## 2020-06-26 NOTE — Plan of Care (Signed)
  Problem: Clinical Measurements: Goal: Will remain free from infection Outcome: Progressing   Problem: Activity: Goal: Risk for activity intolerance will decrease Outcome: Progressing   Problem: Coping: Goal: Level of anxiety will decrease Outcome: Progressing   Problem: Elimination: Goal: Will not experience complications related to urinary retention Outcome: Progressing   Problem: Pain Managment: Goal: General experience of comfort will improve Outcome: Progressing   Problem: Safety: Goal: Ability to remain free from injury will improve Outcome: Progressing

## 2020-06-26 NOTE — Progress Notes (Signed)
PROGRESS NOTE  Deborah [name]  DOB: 1956-07-14  PCP: Leta Speller, MD YTK:[id number]  DOA: 06/25/2020  LOS: 1 day   Chief Complaint  Patient presents with  . Cough  . Shortness of Breath   Brief narrative: Deborah [name]  is a 64 y.o. female with PMH significant for ESRD on HD, HTN, HLD, tracheostomy status.   She had her nurse come out and change out the trach because of congestion.  She has been coughing and bringing up whitish phlegm in the trach.  She has had some runny nose.  The nurse noted a fever of 100.4 at home.  She is a little short of breath.  Her pulse ox was low.  She has had some chills.  Occasional wheezing.   In the ER, she had a low pulse ox of 86% and was placed on oxygen.  Chest xray was normal.   CT angio of chest was negative for any pulmonary medicine.   Hospitalist services were contacted for further evaluation.   The patient did not go to dialysis on the day of admission.  Subjective: Patient was seen and examined this afternoon in dialysis unit. Middle-aged African-American female.  Looks older for age.  Not in distress.  Feels less short of breath with dialysis. Chart reviewed No fever overnight, hydrate in 70s, blood pressure is 140s to 160s. Blood sugar last night 243. Labs stable.  Assessment/Plan: Acute hypoxic respiratory failure.   -Patient had a pulse ox of 86% on room air.   -Currently on trach collar, on 3 L/min. -There is report of low-grade temperature and dyspnea. -Chest x-ray and CT angio chest did not show any evidence of pulmonary edema or pneumonia. -Currently on empiric antibiotics with IV vancomycin IV cefepime. -Blood culture and sputum culture sent. -No fever last 24 hours.  Continue to monitor overnight.  End-stage renal disease.   -MWF schedule, however patient could not get dialysis on the day of admission  -Nephrology was consulted.  Patient is getting dialysis this afternoon.  Essential hypertension -Home meds  include Coreg 25 twice daily, Amlodipine 5 mg daily, Benicar 20 mg daily, Lasix 40 mg daily on nondialysis days.  Midodrine 5 mg 3 times daily on the day of dialysis. -Continue the same.  Continue to monitor.  Type 2 diabetes mellitus -A1c 6.4 on 4/30 -Levemir 13 units at bedtime -Currently on the same.  Continue sliding scheduled Accu-Cheks. -Continue Neurontin 300 mg at bedtime for peripheral neuropathy.  History of CAD, hyperlipidemia History of mitral valve repair  -Continue aspirin, statin, Eliquis.   Other meds Continue Protonix 40 mg daily and Trazodone 50 mg 3 times daily on dialysis days.  Mobility: Encourage ambulation Code Status:   Code Status: Full Code  Nutritional status: Body mass index is 26.28 kg/m.     Diet Order            Diet renal/carb modified with fluid restriction Diet-HS Snack? Nothing; Fluid restriction: 1500 mL Fluid; Room service appropriate? Yes; Fluid consistency: Thin  Diet effective now                 DVT prophylaxis: apixaban (ELIQUIS) tablet 2.5 mg Start: 06/25/20 2200 apixaban (ELIQUIS) tablet 2.5 mg   Antimicrobials:  IV vancomycin and IV cefepime Fluid: None  Consultants: Nephrology Family Communication:  None at bedside  Status is: Inpatient  Remains inpatient appropriate because:IV treatments appropriate due to intensity of illness or inability to take PO   Dispo: The patient is from:  Home              Anticipated d/c is to: Home              Anticipated d/c date is: 1 day              Patient currently is not medically stable to d/c.       Infusions:  . ceFEPime (MAXIPIME) IV    . [START ON 06/28/2020] vancomycin      Scheduled Meds: . albuterol  2.5 mg Nebulization TID PC & HS  . amLODipine  10 mg Oral Daily  . apixaban  2.5 mg Oral BID  . aspirin EC  81 mg Oral Daily  . atorvastatin  80 mg Oral q1800  . carvedilol  25 mg Oral BID  . chlorhexidine  15 mL Mouth Rinse BID  . furosemide  40 mg Oral Daily  .  gabapentin  300 mg Oral QHS  . insulin detemir  13 Units Subcutaneous QHS  . irbesartan  150 mg Oral Daily  . mouth rinse  15 mL Mouth Rinse q12n4p  . midodrine  5 mg Oral Once per day on Mon Wed Fri  . multivitamin  1 tablet Oral Daily  . pantoprazole  40 mg Oral Daily  . sevelamer carbonate  1,600 mg Oral TID WC  . traZODone  50 mg Oral Once per day on Mon Wed Fri    Antimicrobials: Anti-infectives (From admission, onward)   Start     Dose/Rate Route Frequency Ordered Stop   06/28/20 1200  vancomycin (VANCOREADY) IVPB 750 mg/150 mL     Discontinue     750 mg 150 mL/hr over 60 Minutes Intravenous Every M-W-F (Hemodialysis) 06/25/20 1844     06/26/20 1800  ceFEPIme (MAXIPIME) 1 g in sodium chloride 0.9 % 100 mL IVPB     Discontinue     1 g 200 mL/hr over 30 Minutes Intravenous Every 24 hours 06/25/20 1844     06/25/20 1845  vancomycin (VANCOREADY) IVPB 500 mg/100 mL        500 mg 100 mL/hr over 60 Minutes Intravenous  Once 06/25/20 1844 06/25/20 2349   06/25/20 1700  vancomycin (VANCOCIN) IVPB 1000 mg/200 mL premix        1,000 mg 200 mL/hr over 60 Minutes Intravenous  Once 06/25/20 1651 06/25/20 2016   06/25/20 1700  ceFEPIme (MAXIPIME) 2 g in sodium chloride 0.9 % 100 mL IVPB        2 g 200 mL/hr over 30 Minutes Intravenous  Once 06/25/20 1651 06/25/20 1820      PRN meds: acetaminophen **OR** acetaminophen, lidocaine-prilocaine, ondansetron **OR** ondansetron (ZOFRAN) IV   Objective: Vitals:   06/26/20 0105 06/26/20 0435  BP: (!) 149/78 (!) 148/74  Pulse: 79 70  Resp:    Temp: 98.8 F (37.1 C) 98.6 F (37 C)  SpO2: 98% 92%    Intake/Output Summary (Last 24 hours) at 06/26/2020 0718 Last data filed at 06/26/2020 0300 Gross per 24 hour  Intake 340 ml  Output --  Net 340 ml   Filed Weights   06/25/20 1101 06/25/20 2126  Weight: 63.5 kg 65.2 kg   Weight change:  Body mass index is 26.28 kg/m.   Physical Exam: General exam: Appears calm and comfortable.  Not  in physical distress Skin: No rashes, lesions or ulcers. HEENT: Has trach collar anteriorly. Lungs: Clear to auscultation bilaterally CVS: Regular rate and rhythm, no murmur GI/Abd soft, nontender, nondistended, bowel  sound present CNS: Alert, awake, oriented x3 Psychiatry: Mood appropriate Extremities: No pedal edema, no calf tenderness  Data Review: I have personally reviewed the laboratory data and studies available.  Recent Labs  Lab 06/25/20 1251 06/26/20 0320  WBC 7.8 7.2  HGB 13.8 11.8*  HCT 42.0 34.8*  MCV 85.5 83.9  PLT 242 210   Recent Labs  Lab 06/25/20 1251 06/26/20 0320  NA 136 133*  K 4.4 4.4  CL 93* 93*  CO2 29 26  GLUCOSE 194* 171*  BUN 67* 77*  CREATININE 8.07* 9.01*  CALCIUM 9.5 8.6*    Signed, Terrilee Croak, MD [facility] Pager: [phone] (Secure Chat preferred). 06/26/2020

## 2020-06-26 NOTE — Progress Notes (Signed)
HD Start.

## 2020-06-27 LAB — HEPATITIS B SURFACE ANTIGEN: Hepatitis B Surface Ag: NONREACTIVE

## 2020-06-27 LAB — GLUCOSE, CAPILLARY: Glucose-Capillary: 85 mg/dL (ref 70–99)

## 2020-06-27 MED ORDER — VANCOMYCIN HCL 750 MG/150ML IV SOLN: 750.0000 mg | Freq: Once | INTRAVENOUS | Status: DC

## 2020-06-27 MED ORDER — AMOXICILLIN-POT CLAVULANATE 500-125 MG PO TABS
500.0000 mg | ORAL_TABLET | Freq: Two times a day (BID) | ORAL | 0 refills | Status: AC
Start: 2020-06-27 — End: 2020-06-30

## 2020-06-27 NOTE — Plan of Care (Signed)
  Problem: Clinical Measurements: Goal: Ability to maintain clinical measurements within normal limits will improve Outcome: Progressing Goal: Will remain free from infection Outcome: Progressing Goal: Respiratory complications will improve Outcome: Progressing   Problem: Pain Managment: Goal: General experience of comfort will improve Outcome: Progressing

## 2020-06-27 NOTE — Progress Notes (Signed)
Discharge instructions reviewed with patient. Verbalized understanding of all instructions including follow up with MD

## 2020-06-27 NOTE — Discharge Summary (Signed)
Physician Discharge Summary  Deborah [name] ZOX:[id number] DOB: [birth year] DOA: 06/25/2020  PCP: Deborah Speller, MD  Admit date: 06/25/2020 Discharge date: 06/27/2020  Admitted From: Home Discharge disposition: Home   Code Status: Full Code  Diet Recommendation: Cardiac/diabetic diet   Recommendations for Outpatient Follow-Up:   1. Follow-up with PCP as an outpatient 2. Follow-up with nephrology as an outpatient  Discharge Diagnosis:   Active Problems:   Acute hypoxemic respiratory failure ([name])   ESRD (end stage renal disease) ([place])   Type 2 diabetes mellitus with hyperlipidemia ([name])    History of Present Illness / Brief narrative:  [name] a64 y.o.femalewith PMH significant for ESRD on HD, HTN, HLD, tracheostomy status.   She had her nurse come out and change out the trach because of congestion. She has been coughing and bringing up whitish phlegm in the trach. She has had some runny nose. The nurse noted a fever of 100.4 at home. She is a little short of breath. Her pulse ox was low. She has had some chills. Occasional wheezing.   In the ER, she had a low pulse ox of 86% and was placed on oxygen.  Chest xray was normal.   CT angio of chest was negative for any pulmonary medicine.  Patient was admitted under hospitalist service. Nephrology consultation was obtained.  Hospital Course:  Acute hypoxic respiratory failure.  -Patient had a pulse ox of 86% on room air, required 2 L oxygen by nasal cannula.  Weaned off oxygen. -There is report of low-grade temperature and dyspnea. -Chest x-ray and CT angio chest did not show any evidence of pulmonary edema or pneumonia. -Patient was started on on empiric antibiotics with IV vancomycin IV cefepime on admission.  No recurrence of fever during the hospital stay. -Blood culture and sputum culture have not shown any growth so far. -Patient feels better clinically.  Feels ready to go home. -Continue 3  more days of oral Augmentin at home.  End-stage renal disease.  -MWF schedule, however patient could not get dialysis on the day of admission  -Nephrology was consulted.    Patient received dialysis on 7/24.  Essential hypertension -Home meds include Coreg 25 twice daily, Amlodipine 5 mg daily, Benicar 20 mg daily, Lasix 40 mg daily on nondialysis days.  Midodrine 5 mg 3 times daily on the day of dialysis. -All of them have been continued.  Type 2 diabetes mellitus -A1c 6.4 on 4/30 -Continue home regimen of Levemir 13 units at bedtime with sliding scale insulin. -Continue Neurontin 300 mg at bedtime for peripheral neuropathy.  History of CAD, hyperlipidemia History of mitral valve repair  -Continue aspirin, statin, Eliquis.   Other meds Continue Protonix 40 mg daily and Trazodone 50 mg 3 times daily on dialysis days.  Mobility: Encourage ambulation Code Status:  Code Status: Full Code  Wound care:    Subjective:  Seen and examined this morning.  Pleasant middle-aged African-American female.   Sitting up at the edge of the bed.  Not in distress.  Able to talk plugging her trach tube.  Not on supplemental oxygen. Was ready to go home.  Discharge Exam:   Vitals:   06/26/20 2314 06/27/20 0430 06/27/20 0736 06/27/20 0812  BP: (!) 112/49 (!) 113/46  (!) 126/62  Pulse: 75 67  67  Resp: 18 16  18   Temp: 98.6 F (37 C) 98.5 F (36.9 C)  98.4 F (36.9 C)  TempSrc: Oral Oral  Oral  SpO2: 93% 95% 96% 94%  Weight:      Height:        Body mass index is 26.28 kg/m.  General exam: Appears calm and comfortable.  Not in physical distress Skin: No rashes, lesions or ulcers. HEENT: Tracheostomy tube anteriorly.   Lungs: Clear to auscultate bilaterally CVS: Regular rate and rhythm, no murmur GI/Abd soft, nontender, nondistended, bowel sound present. CNS: Alert, awake, alert x3 Psychiatry: Mood appropriate Extremities: No pedal edema, no calf tenderness  Discharge  Instructions:   Discharge Instructions    Increase activity slowly   Complete by: As directed       Follow-up Information    Deborah Speller, MD Follow up.   Specialty: Internal Medicine Contact information: [address] [place] [address] [phone]              Allergies as of 06/27/2020   No Known Allergies     Medication List    TAKE these medications   acetaminophen 325 MG tablet Commonly known as: TYLENOL Take 650 mg by mouth.   albuterol (2.5 MG/3ML) 0.083% nebulizer solution Commonly known as: PROVENTIL Take 3 mLs (2.5 mg total) by nebulization 4 (four) times daily - after meals and at bedtime. What changed:   when to take this  reasons to take this   amLODipine 5 MG tablet Commonly known as: NORVASC Take 10 mg by mouth daily.   amoxicillin-clavulanate 500-125 MG tablet Commonly known as: Augmentin Take 1 tablet (500 mg total) by mouth 2 (two) times daily for 3 days.   apixaban 2.5 MG Tabs tablet Commonly known as: ELIQUIS Take 2.5 mg by mouth 2 (two) times daily.   aspirin 81 MG EC tablet Take 1 tablet (81 mg total) by mouth daily.   atorvastatin 80 MG tablet Commonly known as: LIPITOR Take 80 mg by mouth daily at 6 PM.   carvedilol 25 MG tablet Commonly known as: COREG Take 25 mg by mouth 2 (two) times daily.   furosemide 40 MG tablet Commonly known as: LASIX Take 40 mg by mouth daily. ON NON dialysis days, Sunday, Tuesday, Thursday, Saturday   gabapentin 300 MG capsule Commonly known as: NEURONTIN Take 1 capsule (300 mg total) by mouth at bedtime.   Levemir FlexTouch 100 UNIT/ML FlexPen Generic drug: insulin detemir Inject 13 Units into the skin at bedtime.   lidocaine-prilocaine cream Commonly known as: EMLA Apply 1 application topically every dialysis.   midodrine 5 MG tablet Commonly known as: PROAMATINE Take 5 mg by mouth 3 (three) times a week.   olmesartan 20 MG tablet Commonly known as: BENICAR Take  20 mg by mouth daily.   pantoprazole 40 MG tablet Commonly known as: PROTONIX Take 1 tablet (40 mg total) by mouth 2 (two) times daily.   Rena-Vite Rx 1 MG Tabs Take 1 tablet by mouth daily.   sevelamer carbonate 800 MG tablet Commonly known as: RENVELA Take 1,600 mg by mouth 3 (three) times daily with meals.   traZODone 50 MG tablet Commonly known as: DESYREL Take 50 mg by mouth 3 (three) times a week. Take on Dialysis days       Time coordinating discharge: 35 minutes  The results of significant diagnostics from this hospitalization (including imaging, microbiology, ancillary and laboratory) are listed below for reference.    Procedures and Diagnostic Studies:   DG Chest 2 View  Result Date: 06/25/2020 CLINICAL DATA:  Cough and shortness of breath EXAM: CHEST - 2 VIEW COMPARISON:  04/03/2019 FINDINGS: Cardiac shadow is enlarged.  Postsurgical changes are noted. Tracheostomy tube is seen in satisfactory position. The lungs are well aerated bilaterally. No focal infiltrate or effusion is seen. Prior vascular stenting in the left arm is noted. IMPRESSION: Stable cardiomegaly. No acute abnormality noted. Electronically Signed   By: Inez Catalina M.D.   On: 06/25/2020 11:32   CT ANGIO CHEST PE W OR WO CONTRAST  Result Date: 06/25/2020 CLINICAL DATA:  Fever.  Congestion.  Cough. EXAM: CT ANGIOGRAPHY CHEST WITH CONTRAST TECHNIQUE: Multidetector CT imaging of the chest was performed using the standard protocol during bolus administration of intravenous contrast. Multiplanar CT image reconstructions and MIPs were obtained to evaluate the vascular anatomy. CONTRAST:  60mL OMNIPAQUE IOHEXOL 350 MG/ML SOLN COMPARISON:  November 18, 2016 FINDINGS: Cardiovascular: Contrast injection is sufficient to demonstrate satisfactory opacification of the pulmonary arteries to the segmental level. There is no pulmonary embolus or evidence of right heart strain. The size of the main pulmonary artery is  enlarged, measuring 3.5 cm. Moderate cardiomegaly. There are atherosclerotic changes of the thoracic aorta without evidence for a thoracic aortic aneurysm. Mediastinum/Nodes: --mild mediastinal adenopathy is noted. --mild hilar adenopathy is noted. -- No axillary lymphadenopathy. -- No supraclavicular lymphadenopathy. -- Normal thyroid gland where visualized. -  Unremarkable esophagus. Lungs/Pleura: The tracheostomy tube terminates in the trachea. Bibasilar atelectasis and scarring is again noted. There is no large focal infiltrate. The lung volumes are low. There are trace bilateral pleural effusions. There is no pneumothorax. Upper Abdomen: Contrast bolus timing is not optimized for evaluation of the abdominal organs. The visualized portions of the organs of the upper abdomen are normal. Musculoskeletal: No chest wall abnormality. No bony spinal canal stenosis. Review of the MIP images confirms the above findings. IMPRESSION: 1. No evidence for acute pulmonary embolus. 2. Low lung volumes with bibasilar atelectasis and scarring. 3. Trace bilateral pleural effusions. 4. Enlarged size of the main pulmonary artery, which can be seen in patients with elevated pulmonary artery pressures. 5. Cardiomegaly. Aortic Atherosclerosis (ICD10-I70.0). Electronically Signed   By: Constance Holster M.D.   On: 06/25/2020 20:53     Labs:   Basic Metabolic Panel: Recent Labs  Lab 06/25/20 1251 06/26/20 0320  NA 136 133*  K 4.4 4.4  CL 93* 93*  CO2 29 26  GLUCOSE 194* 171*  BUN 67* 77*  CREATININE 8.07* 9.01*  CALCIUM 9.5 8.6*   GFR Estimated Creatinine Clearance: 5.7 mL/min (A) (by C-G formula based on SCr of 9.01 mg/dL (H)). Liver Function Tests: Recent Labs  Lab 06/25/20 1251  AST 20  ALT 14  ALKPHOS 76  BILITOT 1.4*  PROT 8.4*  ALBUMIN 4.1   No results for input(s): LIPASE, AMYLASE in the last 168 hours. No results for input(s): AMMONIA in the last 168 hours. Coagulation profile No results for  input(s): INR, PROTIME in the last 168 hours.  CBC: Recent Labs  Lab 06/25/20 1251 06/26/20 0320  WBC 7.8 7.2  HGB 13.8 11.8*  HCT 42.0 34.8*  MCV 85.5 83.9  PLT 242 210   Cardiac Enzymes: No results for input(s): CKTOTAL, CKMB, CKMBINDEX, TROPONINI in the last 168 hours. BNP: Invalid input(s): POCBNP CBG: Recent Labs  Lab 06/26/20 0848 06/26/20 1551 06/26/20 1720 06/26/20 2125 06/27/20 0811  GLUCAP 85 117* 134* 311* 85   D-Dimer No results for input(s): DDIMER in the last 72 hours. Hgb A1c No results for input(s): HGBA1C in the last 72 hours. Lipid Profile No results for input(s): CHOL, HDL, LDLCALC, TRIG, CHOLHDL, LDLDIRECT in  the last 72 hours. Thyroid function studies No results for input(s): TSH, T4TOTAL, T3FREE, THYROIDAB in the last 72 hours.  Invalid input(s): FREET3 Anemia work up No results for input(s): VITAMINB12, FOLATE, FERRITIN, TIBC, IRON, RETICCTPCT in the last 72 hours. Microbiology Recent Results (from the past 240 hour(s))  SARS Coronavirus 2 by RT PCR (hospital order, performed in [facility] hospital lab) Nasopharyngeal Nasopharyngeal Swab     Status: None   Collection Time: 06/25/20  2:34 PM   Specimen: Nasopharyngeal Swab  Result Value Ref Range Status   SARS Coronavirus 2 NEGATIVE NEGATIVE Final    Comment: (NOTE) SARS-CoV-2 target nucleic acids are NOT DETECTED.  The SARS-CoV-2 RNA is generally detectable in upper and lower respiratory specimens during the acute phase of infection. The lowest concentration of SARS-CoV-2 viral copies this assay can detect is 250 copies / mL. A negative result does not preclude SARS-CoV-2 infection and should not be used as the sole basis for treatment or other patient management decisions.  A negative result may occur with improper specimen collection / handling, submission of specimen other than nasopharyngeal swab, presence of viral mutation(s) within the areas targeted by this assay, and inadequate  number of viral copies (<250 copies / mL). A negative result must be combined with clinical observations, patient history, and epidemiological information.  Fact Sheet for Patients:   StrictlyIdeas.no  Fact Sheet for Healthcare Providers: BankingDealers.co.za  This test is not yet approved or  cleared by the [place] FDA and has been authorized for detection and/or diagnosis of SARS-CoV-2 by FDA under an Emergency Use Authorization (EUA).  This EUA will remain in effect (meaning this test can be used) for the duration of the COVID-19 declaration under Section 564(b)(1) of the Act, 21 U.S.C. section 360bbb-3(b)(1), unless the authorization is terminated or revoked sooner.  Performed at [facility], [address] [place], [place] [address]   Blood culture (routine x 2)     Status: None (Preliminary result)   Collection Time: 06/25/20  5:00 PM   Specimen: BLOOD  Result Value Ref Range Status   Specimen Description BLOOD LEFT HAND  Final   Special Requests   Final    BOTTLES DRAWN AEROBIC AND ANAEROBIC Blood Culture adequate volume   Culture   Final    NO GROWTH 2 DAYS Performed at [facility], [address], [place], [place] [address]    Report Status PENDING  Incomplete  Blood culture (routine x 2)     Status: None (Preliminary result)   Collection Time: 06/25/20  5:00 PM   Specimen: BLOOD  Result Value Ref Range Status   Specimen Description BLOOD RT HAND  Final   Special Requests   Final    BOTTLES DRAWN AEROBIC AND ANAEROBIC Blood Culture adequate volume   Culture   Final    NO GROWTH 2 DAYS Performed at [facility], [address] [place], [place] [address]    Report Status PENDING  Incomplete    Please note: You were cared for by a hospitalist during your hospital stay. Once you are discharged, your primary care physician will handle any further medical issues. Please note that  NO REFILLS for any discharge medications will be authorized once you are discharged, as it is imperative that you return to your primary care physician (or establish a relationship with a primary care physician if you do not have one) for your post hospital discharge needs so that they can reassess your need  for medications and monitor your lab values.  Signed: Marlowe Aschoff Hadia Minier  [facility] 06/27/2020, 10:27 AM

## 2020-06-28 LAB — HEPATITIS B SURFACE ANTIBODY, QUANTITATIVE: Hep B S AB Quant (Post): >1000 m[IU]/mL (ref >9.9)

## 2020-06-30 LAB — CULTURE, BLOOD (ROUTINE X 2)
Culture: NO GROWTH
Culture: NO GROWTH
Special Requests: ADEQUATE
Special Requests: ADEQUATE

## 2020-07-02 ENCOUNTER — Other Ambulatory Visit: Payer: Self-pay | Admitting: Podiatry

## 2020-07-29 ENCOUNTER — Other Ambulatory Visit: Payer: Self-pay

## 2020-07-29 ENCOUNTER — Emergency Department
Admission: EM | Admit: 2020-07-29 | Discharge: 2020-07-29 | Disposition: A | Discharge status: Home / Self Care | Payer: Medicare Other | Attending: Emergency Medicine | Admitting: Emergency Medicine

## 2020-07-29 DIAGNOSIS — E1140 Type 2 diabetes mellitus with diabetic neuropathy, unspecified: Secondary | ICD-10-CM | POA: Insufficient documentation

## 2020-07-29 DIAGNOSIS — E1122 Type 2 diabetes mellitus with diabetic chronic kidney disease: Secondary | ICD-10-CM | POA: Insufficient documentation

## 2020-07-29 DIAGNOSIS — Z79899 Other long term (current) drug therapy: Secondary | ICD-10-CM | POA: Diagnosis not present

## 2020-07-29 DIAGNOSIS — I252 Old myocardial infarction: Secondary | ICD-10-CM | POA: Diagnosis not present

## 2020-07-29 DIAGNOSIS — I25119 Atherosclerotic heart disease of native coronary artery with unspecified angina pectoris: Secondary | ICD-10-CM | POA: Insufficient documentation

## 2020-07-29 DIAGNOSIS — I4892 Unspecified atrial flutter: Secondary | ICD-10-CM | POA: Diagnosis not present

## 2020-07-29 DIAGNOSIS — Z7982 Long term (current) use of aspirin: Secondary | ICD-10-CM | POA: Insufficient documentation

## 2020-07-29 DIAGNOSIS — N186 End stage renal disease: Secondary | ICD-10-CM | POA: Insufficient documentation

## 2020-07-29 DIAGNOSIS — I132 Hypertensive heart and chronic kidney disease with heart failure and with stage 5 chronic kidney disease, or end stage renal disease: Secondary | ICD-10-CM | POA: Diagnosis not present

## 2020-07-29 DIAGNOSIS — I5033 Acute on chronic diastolic (congestive) heart failure: Secondary | ICD-10-CM | POA: Insufficient documentation

## 2020-07-29 DIAGNOSIS — R0602 Shortness of breath: Secondary | ICD-10-CM | POA: Diagnosis not present

## 2020-07-29 DIAGNOSIS — Z7901 Long term (current) use of anticoagulants: Secondary | ICD-10-CM | POA: Insufficient documentation

## 2020-07-29 DIAGNOSIS — I4891 Unspecified atrial fibrillation: Secondary | ICD-10-CM | POA: Diagnosis not present

## 2020-07-29 DIAGNOSIS — Z992 Dependence on renal dialysis: Secondary | ICD-10-CM | POA: Insufficient documentation

## 2020-07-29 NOTE — ED Provider Notes (Signed)
[facility] Emergency Department Provider Note   ____________________________________________    I have reviewed the triage vital signs and the nursing notes.   HISTORY  Chief Complaint Shortness of Breath     HPI Deborah [name] is a 64 y.o. female with history of diabetes, CHF, end-stage renal disease tracheostomy presents with complaints of shortness of breath with exertion.  EMS reports that they suctioned trach and oxygen saturations improved.  Patient reports she missed dialysis yesterday because her typical nurse who takes her was out sick.  This morning she feels mildly short of breath with exertion.  She has rescheduled dialysis for 11:30 AM.  At this time she feels quite well and has no complaints.  No chest pain no palpitations.  No nausea vomiting diaphoresis.  No cough or fevers.  Past Medical History:  Diagnosis Date  . Diabetes mellitus   . Hyperlipidemia   . Hypertension   . MI, old   . Renal disorder    dialysls    Patient Active Problem List   Diagnosis Date Noted  . Shortness of breath   . Fever   . Acute respiratory failure ([place]) 04/02/2020  . Acute CHF (congestive heart failure) ([place]) 10/17/2019  . Diabetic neuropathy ([place]) 10/16/2017  . Tracheobronchitis 10/11/2017  . Hemorrhage from arteriovenous dialysis graft ([place]) 10/05/2017  . Sepsis ([place]) 09/08/2017  . Adjustment disorder with anxious mood 12/09/2016  . Bacteremia due to Staphylococcus aureus   . Staphylococcus aureus bacteremia with sepsis ([place])   . Type 2 diabetes mellitus with hyperlipidemia ([place])   . ST elevation myocardial infarction involving left main coronary artery ([place])   . Palliative care encounter   . Advance care planning   . Vocal cord dysfunction   . Palliative care by specialist   . Goals of care, counseling/discussion   . Bacteremia   . End-stage renal disease on hemodialysis ([place])   . Paroxysmal atrial fibrillation ([name])   . Heart attack  ([place])   . Uncontrolled type 2 diabetes mellitus with complication, without long-term current use of insulin ([place])   . Staphylococcus aureus bacteremia 11/12/2016  . Vocal cord paralysis   . Respiratory distress 11/02/2016  . Chronic anticoagulation   . Acute lower UTI   . Labile blood glucose   . Leukocytosis   . Subtherapeutic international normalized ratio (INR)   . Supratherapeutic INR   . Cough   . Oropharyngeal dysphagia   . Uncontrolled type 2 diabetes mellitus with complication, with long-term current use of insulin ([place])   . Physical debility 10/11/2016  . Metabolic encephalopathy 01/75/1025  . Debility   . ESRD (end stage renal disease) ([place])   . Type 2 diabetes mellitus with complication, with long-term current use of insulin ([place])   . Anemia of chronic disease   . Coronary artery disease involving native coronary artery of native heart without angina pectoris   . Acute on chronic respiratory failure with hypoxemia ([place])   . Atrial flutter ([employer])   . Persistent cough   . Dysphonia   . Sleep disturbance   . Diabetes mellitus type 2 in nonobese ([name])   . Stage 3 chronic kidney disease   . NSTEMI (non-ST elevated myocardial infarction) ([place])   . Tachypnea   . Hyponatremia   . Lymphocytosis   . Acute blood loss anemia   . Pressure injury of skin 09/18/2016  . History of tracheostomy   . Delirium   . Encounter for attention to  tracheostomy ([place])   . History of MVR with cardiopulmonary bypass   . Systolic congestive heart failure ([place])   . HCAP (healthcare-associated pneumonia)   . PAF (paroxysmal atrial fibrillation) ([place])   . Acute systolic congestive heart failure ([place])   . AKI (acute kidney injury) ([place])   . Abnormal LFTs   . S/P MVR (mitral valve replacement)   . Mitral regurgitation 08/23/2016  . Acute on chronic diastolic heart failure ([place]) 08/18/2016  . Acute pulmonary edema ([name])   . Respiratory failure ([place]) 08/17/2016  . Cardiogenic shock ([place])  08/17/2016  . Acute hypoxemic respiratory failure ([place])   . STEMI (ST elevation myocardial infarction) ([place]) 08/15/2016  . Noncompliance 01/25/2015  . Bereavement 07/09/2014  . Low back pain 02/17/2014  . Cervical cancer screening 11/20/2013  . Routine general medical examination at a health care facility 10/14/2013  . Chronic kidney disease, stage 3 06/14/2012  . Hypertension 03/08/2012  . Diabetes mellitus type 2, controlled 03/08/2012  . Hyperlipidemia 03/08/2012    Past Surgical History:  Procedure Laterality Date  . A/V FISTULAGRAM N/A 10/05/2017   Procedure: A/V Fistulagram;  Surgeon: Katha Cabal, MD;  Location: [facility];  Service: Cardiovascular;  Laterality: N/A;  . A/V SHUNTOGRAM N/A 10/05/2017   Procedure: A/V SHUNTOGRAM;  Surgeon: Katha Cabal, MD;  Location: [facility];  Service: Cardiovascular;  Laterality: N/A;  . AV FISTULA PLACEMENT Left 10/02/2016   Procedure: INSERTION OF ARTERIOVENOUS (AV) GORE-TEX GRAFT ARM;  Surgeon: Waynetta Sandy, MD;  Location: [facility];  Service: Vascular;  Laterality: Left;  . CARDIAC CATHETERIZATION N/A 08/15/2016   Procedure: Left Heart Cath and Coronary Angiography;  Surgeon: Yolonda Kida, MD;  Location: [place];  Service: Cardiovascular;  Laterality: N/A;  . CARDIAC CATHETERIZATION N/A 08/15/2016   Procedure: Coronary Stent Intervention;  Surgeon: Yolonda Kida, MD;  Location: [place] [facility];  Service: Cardiovascular;  Laterality: N/A;  . CARDIAC CATHETERIZATION N/A 08/18/2016   Procedure: Right Heart Cath;  Surgeon: Jolaine Artist, MD;  Location: [facility];  Service: Cardiovascular;  Laterality: N/A;  . CARDIAC CATHETERIZATION N/A 08/18/2016   Procedure: IABP Insertion;  Surgeon: Jolaine Artist, MD;  Location: [facility];  Service: Cardiovascular;  Laterality: N/A;  . CARDIAC CATHETERIZATION Right 08/23/2016   Procedure: CENTRAL LINE INSERTION RIGHT  SUBCLAVIAN;  Surgeon: Ivin Poot, MD;  Location: [facility];  Service: Open Heart Surgery;  Laterality: Right;  . ENDOVEIN HARVEST OF GREATER SAPHENOUS VEIN Left 08/23/2016   Procedure: ENDOVEIN HARVEST OF GREATER SAPHENOUS VEIN;  Surgeon: Ivin Poot, MD;  Location: [facility];  Service: Open Heart Surgery;  Laterality: Left;  . INSERTION OF DIALYSIS CATHETER Bilateral 08/31/2016   Procedure: INSERTION OF DIALYSIS CATHETER LEFT INTERNAL JUGULAR VEIN & INSERTION OF TRIPLE LUMEN RIGHT INTERNAL JUGULAR VEIN;  Surgeon: Angelia Mould, MD;  Location: [facility];  Service: Vascular;  Laterality: Bilateral;  . INTRAOPERATIVE TRANSESOPHAGEAL ECHOCARDIOGRAM N/A 08/23/2016   Procedure: INTRAOPERATIVE TRANSESOPHAGEAL ECHOCARDIOGRAM;  Surgeon: Ivin Poot, MD;  Location: [facility];  Service: Open Heart Surgery;  Laterality: N/A;  . IR GENERIC HISTORICAL  11/13/2016   IR US GUIDE VASC ACCESS LEFT 11/13/2016 Corrie Mckusick, DO [facility]  . IR GENERIC HISTORICAL  11/13/2016   IR FLUORO GUIDE CV LINE LEFT 11/13/2016 Corrie Mckusick, DO [facility]  . IR GENERIC HISTORICAL  11/13/2016   IR GASTROSTOMY TUBE MOD SED 11/13/2016 Corrie Mckusick, DO  [facility]  . MITRAL VALVE REPAIR N/A 08/23/2016   Procedure: MITRAL VALVE REPAIR (MVR) USING 25MM EDWARDS MAGNA EASE BIOPROSTHESIS MITRAL  VALVE;  Surgeon: Ivin Poot, MD;  Location: [facility];  Service: Open Heart Surgery;  Laterality: N/A;  . tracheostomy reversal    . TRACHEOSTOMY TUBE PLACEMENT N/A 11/09/2016   Procedure: TRACHEOSTOMY;  Surgeon: Melissa Montane, MD;  Location: [facility];  Service: ENT;  Laterality: N/A;  . VAGINAL DELIVERY     x 6    Prior to Admission medications   Medication Sig Start Date End Date Taking? Authorizing Provider  acetaminophen (TYLENOL) 325 MG tablet Take 650 mg by mouth. 04/06/17   [provider]  albuterol (PROVENTIL) (2.5 MG/3ML) 0.083% nebulizer solution Take 3 mLs (2.5 mg total) by nebulization 4 (four) times daily - after  meals and at bedtime. Patient taking differently: Take 2.5 mg by nebulization 3 (three) times daily as needed.  11/01/16   Love, Ivan Anchors, PA-C  amLODipine (NORVASC) 5 MG tablet Take 10 mg by mouth daily. 01/08/20   [provider]  apixaban (ELIQUIS) 2.5 MG TABS tablet Take 2.5 mg by mouth 2 (two) times daily.  06/22/17   [provider]  aspirin 81 MG EC tablet Take 1 tablet (81 mg total) by mouth daily. 10/24/19   Swayze, Ava, DO  atorvastatin (LIPITOR) 80 MG tablet Take 80 mg by mouth daily at 6 PM.     [provider]  B Complex-C-Folic Acid (RENA-VITE RX) 1 MG TABS Take 1 tablet by mouth daily. 09/01/19   [provider]  carvedilol (COREG) 25 MG tablet Take 25 mg by mouth 2 (two) times daily. 04/22/18   [provider]  furosemide (LASIX) 40 MG tablet Take 40 mg by mouth daily. ON NON dialysis days, Sunday, Tuesday, Thursday, Saturday    [provider]  gabapentin (NEURONTIN) 300 MG capsule Take 1 capsule (300 mg total) by mouth at bedtime. 04/04/20   Samuella Cota, MD  LEVEMIR FLEXTOUCH 100 UNIT/ML FlexPen Inject 13 Units into the skin at bedtime. 03/05/20   [provider]  lidocaine-prilocaine (EMLA) cream Apply 1 application topically every dialysis. 09/26/19   [provider]  midodrine (PROAMATINE) 5 MG tablet Take 5 mg by mouth 3 (three) times a week.    [provider]  olmesartan (BENICAR) 20 MG tablet Take 20 mg by mouth daily. 03/12/20   [provider]  pantoprazole (PROTONIX) 40 MG tablet Take 1 tablet (40 mg total) by mouth 2 (two) times daily. 11/01/16   Love, Ivan Anchors, PA-C  sevelamer carbonate (RENVELA) 800 MG tablet Take 1,600 mg by mouth 3 (three) times daily with meals.    [provider]  traZODone (DESYREL) 50 MG tablet Take 50 mg by mouth 3 (three) times a week. Take on Dialysis days 04/04/18   [provider]     Allergies Patient has no known allergies.  Family  History  Problem Relation Age of Onset  . Hypertension Mother   . Diabetes Mother   . Breast cancer Sister   . Hypertension Father     Social History Social History   Tobacco Use  . Smoking status: Never Smoker  . Smokeless tobacco: Never Used  Substance Use Topics  . Alcohol use: No  . Drug use: No    Review of Systems  Constitutional: No fever/chills Eyes: No visual changes.  ENT: No sore throat. Cardiovascular: Denies chest pain. Respiratory: As above Gastrointestinal:  No abdominal pain.   Genitourinary: No GU complaints Musculoskeletal: Negative for back pain. Skin: Negative for rash. Neurological: Negative for headaches or weakness   ____________________________________________   PHYSICAL EXAM:  VITAL SIGNS: ED Triage Vitals  Enc Vitals Group     BP 07/29/20 0945 (!) 141/65     Pulse Rate 07/29/20 0942 68     Resp 07/29/20 0945 18     Temp 07/29/20 0945 98.4 F (36.9 C)     Temp Source 07/29/20 0945 Oral     SpO2 07/29/20 0942 93 %     Weight --      Height --      Head Circumference --      Peak Flow --      Pain Score 07/29/20 0943 0     Pain Loc --      Pain Edu? --      Excl. in [place]? --     Constitutional: Alert and oriented. No acute distress. Pleasant and interactive Eyes: Conjunctivae are normal.  Head: Atraumatic. Nose: No congestion/rhinnorhea. Mouth/Throat: Mucous membranes are moist.   Neck:  Painless ROM, trachea noted Cardiovascular: Normal rate, regular rhythm. Grossly normal heart sounds.  Good peripheral circulation. Respiratory: Normal respiratory effort.  No retractions.  No wheezing, no Rales Gastrointestinal: Soft and nontender. No distention.  No CVA tenderness.  Musculoskeletal: No lower extremity tenderness nor edema.  Warm and well perfused Neurologic:  Normal speech and language. No gross focal neurologic deficits are appreciated.  Skin:  Skin is warm, dry and intact. No rash noted. Psychiatric: Mood and affect are  normal. Speech and behavior are normal.  ____________________________________________   LABS (all labs ordered are listed, but only abnormal results are displayed)  Labs Reviewed - No data to display ____________________________________________  EKG  None ____________________________________________  RADIOLOGY  None ____________________________________________   PROCEDURES  Procedure(s) performed: No  Procedures   Critical Care performed: No ____________________________________________   INITIAL IMPRESSION / ASSESSMENT AND PLAN / ED COURSE  Pertinent labs & imaging results that were available during my care of the patient were reviewed by me and considered in my medical decision making (see chart for details).  Patient presents with initial complaints of shortness of breath with exertion however she is quite comfortable at this time and feels well without any increased work of breathing.  She is afebrile with reassuring vital signs.  She is satting between 93 and 97% on room air.  I suspect that she simply requires dialysis and she is already arranged for outpatient dialysis.  Discussed with her that I doubt benefit of extensive ED work-up and that dialysis will likely improve her symptoms greatly and she agrees with holding off on work-up and obtaining outpatient dialysis.  She knows she can return if any worsening of symptoms.   ____________________________________________   FINAL CLINICAL IMPRESSION(S) / ED DIAGNOSES  Final diagnoses:  Shortness of breath        Note:  This document was prepared using Dragon voice recognition software and may include unintentional dictation errors.   Lavonia Drafts, MD 07/29/20 1005

## 2020-07-29 NOTE — ED Triage Notes (Signed)
Pt arrives via EMS from home after having some SHOB upon exertion- pt missed dialysis yesterday and was suppose to go today but did not d/t SHOB and shakiness- per EMS pt was 88% on RA after being suctioned- pt was placed on a NRB- pt other VSS

## 2020-07-29 NOTE — Discharge Instructions (Addendum)
Please go to dialysis today at 11:30 as scheduled.

## 2020-08-12 ENCOUNTER — Other Ambulatory Visit: Payer: Self-pay | Admitting: Podiatry

## 2020-09-14 ENCOUNTER — Other Ambulatory Visit: Payer: Self-pay

## 2020-09-14 ENCOUNTER — Ambulatory Visit (INDEPENDENT_AMBULATORY_CARE_PROVIDER_SITE_OTHER): Payer: Medicare Other | Admitting: Podiatry

## 2020-09-14 DIAGNOSIS — M79676 Pain in unspecified toe(s): Secondary | ICD-10-CM

## 2020-09-14 DIAGNOSIS — E0842 Diabetes mellitus due to underlying condition with diabetic polyneuropathy: Secondary | ICD-10-CM

## 2020-09-14 DIAGNOSIS — B351 Tinea unguium: Secondary | ICD-10-CM

## 2020-09-14 NOTE — Progress Notes (Signed)
   SUBJECTIVE Patient with a history of diabetes mellitus presents to office today complaining of elongated, thickened nails that cause pain while ambulating in shoes. She is unable to trim her own nails. Patient is here for further evaluation and treatment.   Past Medical History:  Diagnosis Date  . Diabetes mellitus   . Hyperlipidemia   . Hypertension   . MI, old   . Renal disorder    dialysls    OBJECTIVE General Patient is awake, alert, and oriented x 3 and in no acute distress. Derm Skin is dry and supple bilateral. Negative open lesions or macerations. Remaining integument unremarkable. Nails are tender, long, thickened and dystrophic with subungual debris, consistent with onychomycosis, 1-5 bilateral. No signs of infection noted. Vasc  DP and PT pedal pulses palpable bilaterally. Temperature gradient within normal limits.  Neuro Epicritic and protective threshold sensation diminished bilaterally.  Musculoskeletal Exam No symptomatic pedal deformities noted bilateral. Muscular strength within normal limits.  ASSESSMENT 1. Diabetes Mellitus w/ peripheral neuropathy 2. Onychomycosis of nail due to dermatophyte bilateral 3. Pain in foot bilateral  PLAN OF CARE 1. Patient evaluated today. 2. Instructed to maintain good pedal hygiene and foot care. Stressed importance of controlling blood sugar.  3. Mechanical debridement of nails 1-5 bilaterally performed using a nail nipper. Filed with dremel without incident.  4. Return to clinic in 3 mos.     Edrick Kins, DPM [facility]  Dr. Edrick Kins, [place]                                        [place], [place] [address]                Office [phone]  Fax [phone]

## 2020-10-28 ENCOUNTER — Emergency Department
Admission: EM | Admit: 2020-10-28 | Discharge: 2020-10-28 | Disposition: A | Discharge status: Home / Self Care | Payer: Medicare Other | Attending: Emergency Medicine | Admitting: Emergency Medicine

## 2020-10-28 ENCOUNTER — Other Ambulatory Visit: Payer: Self-pay

## 2020-10-28 ENCOUNTER — Encounter: Payer: Self-pay | Admitting: *Deleted

## 2020-10-28 DIAGNOSIS — I252 Old myocardial infarction: Secondary | ICD-10-CM | POA: Diagnosis not present

## 2020-10-28 DIAGNOSIS — I120 Hypertensive chronic kidney disease with stage 5 chronic kidney disease or end stage renal disease: Secondary | ICD-10-CM | POA: Diagnosis not present

## 2020-10-28 DIAGNOSIS — X58XXXA Exposure to other specified factors, initial encounter: Secondary | ICD-10-CM | POA: Diagnosis not present

## 2020-10-28 DIAGNOSIS — I770 Arteriovenous fistula, acquired: Secondary | ICD-10-CM

## 2020-10-28 DIAGNOSIS — I5033 Acute on chronic diastolic (congestive) heart failure: Secondary | ICD-10-CM | POA: Diagnosis not present

## 2020-10-28 DIAGNOSIS — Z7982 Long term (current) use of aspirin: Secondary | ICD-10-CM | POA: Insufficient documentation

## 2020-10-28 DIAGNOSIS — E1140 Type 2 diabetes mellitus with diabetic neuropathy, unspecified: Secondary | ICD-10-CM | POA: Insufficient documentation

## 2020-10-28 DIAGNOSIS — I132 Hypertensive heart and chronic kidney disease with heart failure and with stage 5 chronic kidney disease, or end stage renal disease: Secondary | ICD-10-CM | POA: Insufficient documentation

## 2020-10-28 DIAGNOSIS — Z7901 Long term (current) use of anticoagulants: Secondary | ICD-10-CM | POA: Insufficient documentation

## 2020-10-28 DIAGNOSIS — Z992 Dependence on renal dialysis: Secondary | ICD-10-CM | POA: Diagnosis not present

## 2020-10-28 DIAGNOSIS — N186 End stage renal disease: Secondary | ICD-10-CM | POA: Diagnosis not present

## 2020-10-28 DIAGNOSIS — E1122 Type 2 diabetes mellitus with diabetic chronic kidney disease: Secondary | ICD-10-CM | POA: Diagnosis not present

## 2020-10-28 DIAGNOSIS — Z79899 Other long term (current) drug therapy: Secondary | ICD-10-CM | POA: Diagnosis not present

## 2020-10-28 DIAGNOSIS — T82838A Hemorrhage of vascular prosthetic devices, implants and grafts, initial encounter: Secondary | ICD-10-CM | POA: Diagnosis not present

## 2020-10-28 LAB — CBC
HCT: 34.4 % — ABNORMAL LOW (ref 36.0–46.0)
Hemoglobin: 11.4 g/dL — ABNORMAL LOW (ref 12.0–15.0)
MCH: 27.9 pg (ref 26.0–34.0)
MCHC: 33.1 g/dL (ref 30.0–36.0)
MCV: 84.3 fL (ref 80.0–100.0)
Platelets: 175 10*3/uL (ref 150–400)
RBC: 4.08 MIL/uL (ref 3.87–5.11)
RDW: 16.1 % — ABNORMAL HIGH (ref 11.5–15.5)
WBC: 7.6 10*3/uL (ref 4.0–10.5)
nRBC: 0 % (ref 0.0–0.2)

## 2020-10-28 MED ORDER — "THROMBI-PAD 3""X3"" EX PADS": 1.0000 | MEDICATED_PAD | Freq: Once | CUTANEOUS | Status: DC

## 2020-10-28 NOTE — Discharge Instructions (Addendum)
DO NOT TAKE YOUR ELIQUIS FOR THE NEXT 48 HOURS, TO HELP FORM A HEALING BLOOD CLOT  CALL YOUR DOCTOR IF BLEEDING CONTINUES OR RETURNS

## 2020-10-28 NOTE — ED Notes (Signed)
EDP at bedside. Fistula clamp and quik clot placed by provider.

## 2020-10-28 NOTE — ED Notes (Signed)
L arm wrapped with gauze over quik clot gauze, no new bleeding noted at fistula site. Family at bedside.

## 2020-10-28 NOTE — ED Notes (Signed)
Pt asked if should take Eliquis today. EDP told this nurse to instruct pt to hold Eliquis for today and tomorrow, then restart next day.

## 2020-10-28 NOTE — ED Triage Notes (Signed)
Per EMS report, patient had dialysis yesterday and access point on left fistula is bleeding. Bleeding began yesterday. VSS per EMT report.

## 2020-10-28 NOTE — ED Provider Notes (Signed)
[facility] Emergency Department Provider Note  ____________________________________________   First MD Initiated Contact with Patient 10/28/20 1222     (approximate)  I have reviewed the triage vital signs and the nursing notes.   HISTORY  Chief Complaint Bleeding from fistula    HPI Deborah [name] is a 64 y.o. female  HTN, HLD, DM, here with bleeding from AV fistula. Pt went to HD yesterday. She states she had moderate oozing from her fistula after session, but this was controlled w/ pressure. A dressing was placed and she was sent home. She has been bleeding from the site since then She has applied multiple dressings that have been soaked. Denies any pain. No distal numbness, weakness. No CP, lightheadedness, or dizziness. No other complaints. She did not take her Eliquis this morning.         Past Medical History:  Diagnosis Date  . Diabetes mellitus   . Hyperlipidemia   . Hypertension   . MI, old   . Renal disorder    dialysls    Patient Active Problem List   Diagnosis Date Noted  . Shortness of breath   . Fever   . Acute respiratory failure ([place]) 04/02/2020  . Acute CHF (congestive heart failure) ([place]) 10/17/2019  . Diabetic neuropathy ([place]) 10/16/2017  . Tracheobronchitis 10/11/2017  . Hemorrhage from arteriovenous dialysis graft ([place]) 10/05/2017  . Sepsis ([place]) 09/08/2017  . Adjustment disorder with anxious mood 12/09/2016  . Bacteremia due to Staphylococcus aureus   . Staphylococcus aureus bacteremia with sepsis ([place])   . Type 2 diabetes mellitus with hyperlipidemia ([place])   . ST elevation myocardial infarction involving left main coronary artery ([place])   . Palliative care encounter   . Advance care planning   . Vocal cord dysfunction   . Palliative care by specialist   . Goals of care, counseling/discussion   . Bacteremia   . End-stage renal disease on hemodialysis ([place])   . Paroxysmal atrial fibrillation ([name])   . Heart attack  ([place])   . Uncontrolled type 2 diabetes mellitus with complication, without long-term current use of insulin ([place])   . Staphylococcus aureus bacteremia 11/12/2016  . Vocal cord paralysis   . Respiratory distress 11/02/2016  . Chronic anticoagulation   . Acute lower UTI   . Labile blood glucose   . Leukocytosis   . Subtherapeutic international normalized ratio (INR)   . Supratherapeutic INR   . Cough   . Oropharyngeal dysphagia   . Uncontrolled type 2 diabetes mellitus with complication, with long-term current use of insulin ([place])   . Physical debility 10/11/2016  . Metabolic encephalopathy 35/59/7416  . Debility   . ESRD (end stage renal disease) ([place])   . Type 2 diabetes mellitus with complication, with long-term current use of insulin ([place])   . Anemia of chronic disease   . Coronary artery disease involving native coronary artery of native heart without angina pectoris   . Acute on chronic respiratory failure with hypoxemia ([place])   . Atrial flutter ([place])   . Persistent cough   . Dysphonia   . Sleep disturbance   . Diabetes mellitus type 2 in nonobese ([name])   . Stage 3 chronic kidney disease ([place])   . NSTEMI (non-ST elevated myocardial infarction) ([place])   . Tachypnea   . Hyponatremia   . Lymphocytosis   . Acute blood loss anemia   . Pressure injury of skin 09/18/2016  . History of tracheostomy   . Delirium   .  Encounter for attention to tracheostomy ([place])   . History of MVR with cardiopulmonary bypass   . Systolic congestive heart failure ([place])   . HCAP (healthcare-associated pneumonia)   . PAF (paroxysmal atrial fibrillation) ([place])   . Acute systolic congestive heart failure ([place])   . AKI (acute kidney injury) ([place])   . Abnormal LFTs   . S/P MVR (mitral valve replacement)   . Mitral regurgitation 08/23/2016  . Acute on chronic diastolic heart failure ([place]) 08/18/2016  . Acute pulmonary edema ([name])   . Respiratory failure ([place]) 08/17/2016  . Cardiogenic shock ([place])  08/17/2016  . Acute hypoxemic respiratory failure ([place])   . STEMI (ST elevation myocardial infarction) ([place]) 08/15/2016  . Noncompliance 01/25/2015  . Bereavement 07/09/2014  . Low back pain 02/17/2014  . Cervical cancer screening 11/20/2013  . Routine general medical examination at a health care facility 10/14/2013  . Chronic kidney disease, stage 3 ([place]) 06/14/2012  . Hypertension 03/08/2012  . Diabetes mellitus type 2, controlled 03/08/2012  . Hyperlipidemia 03/08/2012    Past Surgical History:  Procedure Laterality Date  . A/V FISTULAGRAM N/A 10/05/2017   Procedure: A/V Fistulagram;  Surgeon: Katha Cabal, MD;  Location: [place] [facility];  Service: Cardiovascular;  Laterality: N/A;  . A/V SHUNTOGRAM N/A 10/05/2017   Procedure: A/V SHUNTOGRAM;  Surgeon: Katha Cabal, MD;  Location: [place] [facility];  Service: Cardiovascular;  Laterality: N/A;  . AV FISTULA PLACEMENT Left 10/02/2016   Procedure: INSERTION OF ARTERIOVENOUS (AV) GORE-TEX GRAFT ARM;  Surgeon: Waynetta Sandy, MD;  Location: [place];  Service: Vascular;  Laterality: Left;  . CARDIAC CATHETERIZATION N/A 08/15/2016   Procedure: Left Heart Cath and Coronary Angiography;  Surgeon: Yolonda Kida, MD;  Location: [place] [facility];  Service: Cardiovascular;  Laterality: N/A;  . CARDIAC CATHETERIZATION N/A 08/15/2016   Procedure: Coronary Stent Intervention;  Surgeon: Yolonda Kida, MD;  Location: [place] [facility];  Service: Cardiovascular;  Laterality: N/A;  . CARDIAC CATHETERIZATION N/A 08/18/2016   Procedure: Right Heart Cath;  Surgeon: Jolaine Artist, MD;  Location: [place] [facility];  Service: Cardiovascular;  Laterality: N/A;  . CARDIAC CATHETERIZATION N/A 08/18/2016   Procedure: IABP Insertion;  Surgeon: Jolaine Artist, MD;  Location: [place] [facility];  Service: Cardiovascular;  Laterality: N/A;  . CARDIAC CATHETERIZATION Right 08/23/2016   Procedure: CENTRAL LINE INSERTION  RIGHT SUBCLAVIAN;  Surgeon: Ivin Poot, MD;  Location: [place];  Service: Open Heart Surgery;  Laterality: Right;  . ENDOVEIN HARVEST OF GREATER SAPHENOUS VEIN Left 08/23/2016   Procedure: ENDOVEIN HARVEST OF GREATER SAPHENOUS VEIN;  Surgeon: Ivin Poot, MD;  Location: [place];  Service: Open Heart Surgery;  Laterality: Left;  . INSERTION OF DIALYSIS CATHETER Bilateral 08/31/2016   Procedure: INSERTION OF DIALYSIS CATHETER LEFT INTERNAL JUGULAR VEIN & INSERTION OF TRIPLE LUMEN RIGHT INTERNAL JUGULAR VEIN;  Surgeon: Angelia Mould, MD;  Location: [place] [place];  Service: Vascular;  Laterality: Bilateral;  . INTRAOPERATIVE TRANSESOPHAGEAL ECHOCARDIOGRAM N/A 08/23/2016   Procedure: INTRAOPERATIVE TRANSESOPHAGEAL ECHOCARDIOGRAM;  Surgeon: Ivin Poot, MD;  Location: [place];  Service: Open Heart Surgery;  Laterality: N/A;  . IR GENERIC HISTORICAL  11/13/2016   IR US GUIDE VASC ACCESS LEFT 11/13/2016 Corrie Mckusick, DO [facility]  . IR GENERIC HISTORICAL  11/13/2016   IR FLUORO GUIDE CV LINE LEFT 11/13/2016 Corrie Mckusick, DO [facility]  . IR GENERIC HISTORICAL  11/13/2016   IR GASTROSTOMY TUBE MOD  SED 11/13/2016 Corrie Mckusick, DO [facility]  . MITRAL VALVE REPAIR N/A 08/23/2016   Procedure: MITRAL VALVE REPAIR (MVR) USING 25MM EDWARDS MAGNA EASE BIOPROSTHESIS MITRAL  VALVE;  Surgeon: Ivin Poot, MD;  Location: [facility];  Service: Open Heart Surgery;  Laterality: N/A;  . tracheostomy reversal    . TRACHEOSTOMY TUBE PLACEMENT N/A 11/09/2016   Procedure: TRACHEOSTOMY;  Surgeon: Melissa Montane, MD;  Location: [facility];  Service: ENT;  Laterality: N/A;  . VAGINAL DELIVERY     x 6    Prior to Admission medications   Medication Sig Start Date End Date Taking? Authorizing Provider  acetaminophen (TYLENOL) 325 MG tablet Take 650 mg by mouth. 04/06/17   [provider]  albuterol (PROVENTIL) (2.5 MG/3ML) 0.083% nebulizer solution Take 3 mLs (2.5 mg total) by nebulization 4 (four) times daily -  after meals and at bedtime. Patient taking differently: Take 2.5 mg by nebulization 3 (three) times daily as needed.  11/01/16   Love, Ivan Anchors, PA-C  amLODipine (NORVASC) 5 MG tablet Take 10 mg by mouth daily. 01/08/20   [provider]  apixaban (ELIQUIS) 2.5 MG TABS tablet Take 2.5 mg by mouth 2 (two) times daily.  06/22/17   [provider]  aspirin 81 MG EC tablet Take 1 tablet (81 mg total) by mouth daily. 10/24/19   Swayze, Ava, DO  atorvastatin (LIPITOR) 80 MG tablet Take 80 mg by mouth daily at 6 PM.     [provider]  B Complex-C-Folic Acid (RENA-VITE RX) 1 MG TABS Take 1 tablet by mouth daily. 09/01/19   [provider]  carvedilol (COREG) 25 MG tablet Take 25 mg by mouth 2 (two) times daily. 04/22/18   [provider]  furosemide (LASIX) 40 MG tablet Take 40 mg by mouth daily. ON NON dialysis days, Sunday, Tuesday, Thursday, Saturday    [provider]  gabapentin (NEURONTIN) 300 MG capsule Take 1 capsule (300 mg total) by mouth at bedtime. 04/04/20   Samuella Cota, MD  LEVEMIR FLEXTOUCH 100 UNIT/ML FlexPen Inject 13 Units into the skin at bedtime. 03/05/20   [provider]  lidocaine-prilocaine (EMLA) cream Apply 1 application topically every dialysis. 09/26/19   [provider]  midodrine (PROAMATINE) 5 MG tablet Take 5 mg by mouth 3 (three) times a week.    [provider]  olmesartan (BENICAR) 20 MG tablet Take 20 mg by mouth daily. 03/12/20   [provider]  pantoprazole (PROTONIX) 40 MG tablet Take 1 tablet (40 mg total) by mouth 2 (two) times daily. 11/01/16   Love, Ivan Anchors, PA-C  sevelamer carbonate (RENVELA) 800 MG tablet Take 1,600 mg by mouth 3 (three) times daily with meals.    [provider]  traZODone (DESYREL) 50 MG tablet Take 50 mg by mouth 3 (three) times a week. Take on Dialysis days 04/04/18   [provider]    Allergies Patient has no known  allergies.  Family History  Problem Relation Age of Onset  . Hypertension Mother   . Diabetes Mother   . Breast cancer Sister   . Hypertension Father     Social History Social History   Tobacco Use  . Smoking status: Never Smoker  . Smokeless tobacco: Never Used  Substance Use Topics  . Alcohol use: No  . Drug use: No    Review of Systems  Review of Systems  Constitutional: Negative for chills and fever.  HENT: Negative for sore throat.  Respiratory: Negative for shortness of breath.   Cardiovascular: Negative for chest pain.  Gastrointestinal: Negative for abdominal pain.  Genitourinary: Negative for flank pain.  Musculoskeletal: Negative for neck pain.  Skin: Positive for wound. Negative for rash.  Allergic/Immunologic: Negative for immunocompromised state.  Neurological: Negative for weakness and numbness.  Hematological: Does not bruise/bleed easily.     ____________________________________________  PHYSICAL EXAM:      VITAL SIGNS: ED Triage Vitals  Enc Vitals Group     BP 10/28/20 1208 (!) 163/72     Pulse Rate 10/28/20 1208 63     Resp 10/28/20 1208 18     Temp 10/28/20 1208 98.4 F (36.9 C)     Temp Source 10/28/20 1208 Oral     SpO2 10/28/20 1208 100 %     Weight 10/28/20 1212 137 lb (62.1 kg)     Height 10/28/20 1212 5\' 2"  (1.575 m)     Head Circumference --      [place] Flow --      Pain Score 10/28/20 1208 0     Pain Loc --      Pain Edu? --      Excl. in [place]? --      Physical Exam Vitals and nursing note reviewed.  Constitutional:      General: She is not in acute distress.    Appearance: She is well-developed.  HENT:     Head: Normocephalic and atraumatic.  Eyes:     Conjunctiva/sclera: Conjunctivae normal.  Cardiovascular:     Rate and Rhythm: Normal rate and regular rhythm.     Heart sounds: Normal heart sounds.  Pulmonary:     Effort: Pulmonary effort is normal. No respiratory distress.     Breath sounds: No wheezing.   Abdominal:     General: There is no distension.  Musculoskeletal:     Cervical back: Neck supple.  Skin:    General: Skin is warm.     Capillary Refill: Capillary refill takes less than 2 seconds.     Findings: No rash.  Neurological:     Mental Status: She is alert and oriented to person, place, and time.     Motor: No abnormal muscle tone.      UPPER EXTREMITY EXAM: LEFT  INSPECTION & PALPATION: AV fistula with small puncture area along lower loop with venous bleeding that is persistent but non pulsatile.  SENSORY: Sensation is intact to light touch in:  Superficial radial nerve distribution (dorsal first web space) Median nerve distribution (tip of index finger)   Ulnar nerve distribution (tip of small finger)     MOTOR:  + Motor posterior interosseous nerve (thumb IP extension) + Anterior interosseous nerve (thumb IP flexion, index finger DIP flexion) + Radial nerve (wrist extension) + Median nerve (palpable firing thenar mass) + Ulnar nerve (palpable firing of first dorsal interosseous muscle)  VASCULAR: 2+ radial pulse Brisk capillary refill < 2 sec, fingers warm and well-perfused  ____________________________________________   LABS (all labs ordered are listed, but only abnormal results are displayed)  Labs Reviewed  CBC - Abnormal; Notable for the following components:      Result Value   Hemoglobin 11.4 (*)    HCT 34.4 (*)    RDW 16.1 (*)    All other components within normal limits    ____________________________________________  EKG:  ________________________________________  RADIOLOGY All imaging, including plain films, CT scans, and ultrasounds, independently reviewed by me, and interpretations confirmed via formal radiology reads.  ED MD interpretation:     Official radiology report(s): No results found.  ____________________________________________  PROCEDURES   Procedure(s) performed (including Critical  Care):  Procedures  ____________________________________________  INITIAL IMPRESSION / MDM / [place] / ED COURSE  As part of my medical decision making, I reviewed the following data within the [facility] notes reviewed and incorporated, Old chart reviewed, Notes from prior ED visits, and [place] Controlled Substance Database       *Deborah [name] was evaluated in Emergency Department on 10/28/2020 for the symptoms described in the history of present illness. She was evaluated in the context of the global COVID-19 pandemic, which necessitated consideration that the patient might be at risk for infection with the SARS-CoV-2 virus that causes COVID-19. Institutional protocols and algorithms that pertain to the evaluation of patients at risk for COVID-19 are in a state of rapid change based on information released by regulatory bodies including the CDC and federal and state organizations. These policies and algorithms were followed during the patient's care in the ED.  Some ED evaluations and interventions may be delayed as a result of limited staffing during the pandemic.*     Medical Decision Making: 64 year old female here with bleeding from AV fistula on the left.  The area was cleaned, and a hemostatic dressing was applied with firm, direct pressure for 10 minutes by myself.  This achieved excellent hemostasis.  The patient was monitored for greater than 30 minutes after this with no recurrence of bleeding.  Will dressed with a loose dressing to prevent any further damage to the fistula, and advise close monitoring of the wound at home.  She will hold her Eliquis for the next 48 hours.  Otherwise, CBC checked due to reported blood loss and reveals hemoglobin which is near baseline.  She does not appear symptomatic.  Will discharge with good return precautions.  She was provided with hemostatic gauze if she had recurrence of  bleeding.  ____________________________________________  FINAL CLINICAL IMPRESSION(S) / ED DIAGNOSES  Final diagnoses:  A-V fistula ([place])     MEDICATIONS GIVEN DURING THIS VISIT:  Medications - No data to display   ED Discharge Orders    None       Note:  This document was prepared using Dragon voice recognition software and may include unintentional dictation errors.   Duffy Bruce, MD 10/28/20 1416

## 2020-11-01 ENCOUNTER — Emergency Department: Payer: Medicare Other

## 2020-11-01 ENCOUNTER — Inpatient Hospital Stay
Admission: EM | Admit: 2020-11-01 | Discharge: 2020-11-09 | DRG: 871 | Disposition: A | Discharge status: Home Health | Payer: Medicare Other | Attending: Internal Medicine | Admitting: Internal Medicine

## 2020-11-01 ENCOUNTER — Other Ambulatory Visit: Payer: Self-pay

## 2020-11-01 DIAGNOSIS — Z952 Presence of prosthetic heart valve: Secondary | ICD-10-CM

## 2020-11-01 DIAGNOSIS — F32A Depression, unspecified: Secondary | ICD-10-CM | POA: Diagnosis present

## 2020-11-01 DIAGNOSIS — J9601 Acute respiratory failure with hypoxia: Secondary | ICD-10-CM | POA: Diagnosis present

## 2020-11-01 DIAGNOSIS — Z8249 Family history of ischemic heart disease and other diseases of the circulatory system: Secondary | ICD-10-CM | POA: Diagnosis not present

## 2020-11-01 DIAGNOSIS — E1140 Type 2 diabetes mellitus with diabetic neuropathy, unspecified: Secondary | ICD-10-CM | POA: Diagnosis present

## 2020-11-01 DIAGNOSIS — I5031 Acute diastolic (congestive) heart failure: Secondary | ICD-10-CM | POA: Diagnosis not present

## 2020-11-01 DIAGNOSIS — Z930 Tracheostomy status: Secondary | ICD-10-CM | POA: Diagnosis not present

## 2020-11-01 DIAGNOSIS — N186 End stage renal disease: Secondary | ICD-10-CM | POA: Diagnosis present

## 2020-11-01 DIAGNOSIS — D631 Anemia in chronic kidney disease: Secondary | ICD-10-CM | POA: Diagnosis present

## 2020-11-01 DIAGNOSIS — I2510 Atherosclerotic heart disease of native coronary artery without angina pectoris: Secondary | ICD-10-CM | POA: Diagnosis present

## 2020-11-01 DIAGNOSIS — I10 Essential (primary) hypertension: Secondary | ICD-10-CM | POA: Diagnosis present

## 2020-11-01 DIAGNOSIS — I252 Old myocardial infarction: Secondary | ICD-10-CM | POA: Diagnosis not present

## 2020-11-01 DIAGNOSIS — I5033 Acute on chronic diastolic (congestive) heart failure: Secondary | ICD-10-CM | POA: Diagnosis present

## 2020-11-01 DIAGNOSIS — Z8619 Personal history of other infectious and parasitic diseases: Secondary | ICD-10-CM

## 2020-11-01 DIAGNOSIS — E785 Hyperlipidemia, unspecified: Secondary | ICD-10-CM | POA: Diagnosis present

## 2020-11-01 DIAGNOSIS — J189 Pneumonia, unspecified organism: Secondary | ICD-10-CM

## 2020-11-01 DIAGNOSIS — Z992 Dependence on renal dialysis: Secondary | ICD-10-CM

## 2020-11-01 DIAGNOSIS — J9811 Atelectasis: Secondary | ICD-10-CM | POA: Diagnosis present

## 2020-11-01 DIAGNOSIS — Z833 Family history of diabetes mellitus: Secondary | ICD-10-CM | POA: Diagnosis not present

## 2020-11-01 DIAGNOSIS — Z79899 Other long term (current) drug therapy: Secondary | ICD-10-CM

## 2020-11-01 DIAGNOSIS — A419 Sepsis, unspecified organism: Secondary | ICD-10-CM | POA: Diagnosis present

## 2020-11-01 DIAGNOSIS — I483 Typical atrial flutter: Secondary | ICD-10-CM | POA: Diagnosis not present

## 2020-11-01 DIAGNOSIS — Z7982 Long term (current) use of aspirin: Secondary | ICD-10-CM

## 2020-11-01 DIAGNOSIS — Z7901 Long term (current) use of anticoagulants: Secondary | ICD-10-CM

## 2020-11-01 DIAGNOSIS — I480 Paroxysmal atrial fibrillation: Secondary | ICD-10-CM | POA: Diagnosis present

## 2020-11-01 DIAGNOSIS — I272 Pulmonary hypertension, unspecified: Secondary | ICD-10-CM | POA: Diagnosis present

## 2020-11-01 DIAGNOSIS — Z20822 Contact with and (suspected) exposure to covid-19: Secondary | ICD-10-CM | POA: Diagnosis present

## 2020-11-01 DIAGNOSIS — Z794 Long term (current) use of insulin: Secondary | ICD-10-CM

## 2020-11-01 DIAGNOSIS — E1122 Type 2 diabetes mellitus with diabetic chronic kidney disease: Secondary | ICD-10-CM | POA: Diagnosis present

## 2020-11-01 DIAGNOSIS — I5043 Acute on chronic combined systolic (congestive) and diastolic (congestive) heart failure: Secondary | ICD-10-CM | POA: Diagnosis present

## 2020-11-01 DIAGNOSIS — R0602 Shortness of breath: Secondary | ICD-10-CM | POA: Diagnosis present

## 2020-11-01 DIAGNOSIS — E119 Type 2 diabetes mellitus without complications: Secondary | ICD-10-CM

## 2020-11-01 DIAGNOSIS — I132 Hypertensive heart and chronic kidney disease with heart failure and with stage 5 chronic kidney disease, or end stage renal disease: Secondary | ICD-10-CM | POA: Diagnosis present

## 2020-11-01 DIAGNOSIS — I4892 Unspecified atrial flutter: Secondary | ICD-10-CM | POA: Diagnosis present

## 2020-11-01 LAB — BASIC METABOLIC PANEL
Anion gap: 14 (ref 5–15)
BUN: 72 mg/dL — ABNORMAL HIGH (ref 8–23)
CO2: 26 mmol/L (ref 22–32)
Calcium: 9.6 mg/dL (ref 8.9–10.3)
Chloride: 98 mmol/L (ref 98–111)
Creatinine, Ser: 8.66 mg/dL — ABNORMAL HIGH (ref 0.44–1.00)
GFR, Estimated: 5 mL/min — ABNORMAL LOW (ref >60)
Glucose, Bld: 155 mg/dL — ABNORMAL HIGH (ref 70–99)
Potassium: 4.8 mmol/L (ref 3.5–5.1)
Sodium: 138 mmol/L (ref 135–145)

## 2020-11-01 LAB — HEMOGLOBIN A1C
Hgb A1c MFr Bld: 7.2 % — ABNORMAL HIGH (ref 4.8–5.6)
Mean Plasma Glucose: 159.94 mg/dL

## 2020-11-01 LAB — RESP PANEL BY RT-PCR (FLU A&B, COVID) ARPGX2
Influenza A by PCR: NEGATIVE
Influenza B by PCR: NEGATIVE
SARS Coronavirus 2 by RT PCR: NEGATIVE

## 2020-11-01 LAB — CBC
HCT: 32.6 % — ABNORMAL LOW (ref 36.0–46.0)
Hemoglobin: 10.8 g/dL — ABNORMAL LOW (ref 12.0–15.0)
MCH: 28.1 pg (ref 26.0–34.0)
MCHC: 33.1 g/dL (ref 30.0–36.0)
MCV: 84.7 fL (ref 80.0–100.0)
Platelets: 209 10*3/uL (ref 150–400)
RBC: 3.85 MIL/uL — ABNORMAL LOW (ref 3.87–5.11)
RDW: 16.6 % — ABNORMAL HIGH (ref 11.5–15.5)
WBC: 12.3 10*3/uL — ABNORMAL HIGH (ref 4.0–10.5)
nRBC: 0 % (ref 0.0–0.2)

## 2020-11-01 LAB — BRAIN NATRIURETIC PEPTIDE: B Natriuretic Peptide: 942.5 pg/mL — ABNORMAL HIGH (ref 0.0–100.0)

## 2020-11-01 LAB — GLUCOSE, CAPILLARY: Glucose-Capillary: 133 mg/dL — ABNORMAL HIGH (ref 70–99)

## 2020-11-01 LAB — PHOSPHORUS: Phosphorus: 3.4 mg/dL (ref 2.5–4.6)

## 2020-11-01 LAB — TROPONIN I (HIGH SENSITIVITY)
Troponin I (High Sensitivity): 14 ng/L (ref <18)
Troponin I (High Sensitivity): 13 ng/L (ref <18)

## 2020-11-01 LAB — CBG MONITORING, ED: Glucose-Capillary: 138 mg/dL — ABNORMAL HIGH (ref 70–99)

## 2020-11-01 LAB — HIV ANTIBODY (ROUTINE TESTING W REFLEX): HIV Screen 4th Generation wRfx: NONREACTIVE

## 2020-11-01 MED ORDER — SODIUM CHLORIDE 0.9% FLUSH
3.0000 mL | Freq: Two times a day (BID) | INTRAVENOUS | Status: DC
  Administered 2020-11-01 – 2020-11-08 (×13): 3 mL via INTRAVENOUS

## 2020-11-01 MED ORDER — ATORVASTATIN CALCIUM 80 MG PO TABS
80.0000 mg | ORAL_TABLET | Freq: Every day | ORAL | Status: DC
  Administered 2020-11-01 – 2020-11-09 (×9): 80 mg via ORAL
  Filled 2020-11-01 (×9): qty 1

## 2020-11-01 MED ORDER — TRAZODONE HCL 50 MG PO TABS
50.0000 mg | ORAL_TABLET | ORAL | Status: DC
  Administered 2020-11-01 – 2020-11-08 (×4): 50 mg via ORAL
  Filled 2020-11-01 (×4): qty 1

## 2020-11-01 MED ORDER — ASPIRIN EC 81 MG PO TBEC
81.0000 mg | DELAYED_RELEASE_TABLET | Freq: Every day | ORAL | Status: DC
  Administered 2020-11-01 – 2020-11-09 (×9): 81 mg via ORAL
  Filled 2020-11-01 (×10): qty 1

## 2020-11-01 MED ORDER — SEVELAMER CARBONATE 800 MG PO TABS
1600.0000 mg | ORAL_TABLET | Freq: Three times a day (TID) | ORAL | Status: DC
  Administered 2020-11-02 – 2020-11-09 (×22): 1600 mg via ORAL
  Filled 2020-11-01 (×22): qty 2

## 2020-11-01 MED ORDER — SODIUM CHLORIDE 0.9 % IV SOLN
500.0000 mg | INTRAVENOUS | Status: DC
  Administered 2020-11-02: 500 mg via INTRAVENOUS
  Filled 2020-11-01 (×2): qty 500

## 2020-11-01 MED ORDER — APIXABAN 2.5 MG PO TABS
2.5000 mg | ORAL_TABLET | Freq: Two times a day (BID) | ORAL | Status: DC
  Administered 2020-11-01 – 2020-11-09 (×16): 2.5 mg via ORAL
  Filled 2020-11-01 (×18): qty 1

## 2020-11-01 MED ORDER — INSULIN ASPART 100 UNIT/ML ~~LOC~~ SOLN
0.0000 [IU] | Freq: Three times a day (TID) | SUBCUTANEOUS | Status: DC
  Administered 2020-11-02 (×2): 1 [IU] via SUBCUTANEOUS
  Administered 2020-11-02 – 2020-11-04 (×3): 2 [IU] via SUBCUTANEOUS
  Administered 2020-11-04: 1 [IU] via SUBCUTANEOUS
  Administered 2020-11-05: 2 [IU] via SUBCUTANEOUS
  Administered 2020-11-06 (×2): 1 [IU] via SUBCUTANEOUS
  Administered 2020-11-07: 2 [IU] via SUBCUTANEOUS
  Administered 2020-11-08 – 2020-11-09 (×3): 1 [IU] via SUBCUTANEOUS
  Filled 2020-11-01 (×11): qty 1

## 2020-11-01 MED ORDER — SODIUM CHLORIDE 0.9% FLUSH: 3.0000 mL | INTRAVENOUS | Status: DC | PRN

## 2020-11-01 MED ORDER — FUROSEMIDE 40 MG PO TABS
40.0000 mg | ORAL_TABLET | ORAL | Status: DC
  Administered 2020-11-02 – 2020-11-09 (×5): 40 mg via ORAL
  Filled 2020-11-01 (×5): qty 1

## 2020-11-01 MED ORDER — MIDODRINE HCL 5 MG PO TABS
5.0000 mg | ORAL_TABLET | ORAL | Status: DC
  Administered 2020-11-03 – 2020-11-08 (×2): 5 mg via ORAL
  Filled 2020-11-01 (×3): qty 1

## 2020-11-01 MED ORDER — SODIUM CHLORIDE 0.9 % IV SOLN
500.0000 mg | Freq: Once | INTRAVENOUS | Status: AC
  Administered 2020-11-01: 500 mg via INTRAVENOUS
  Filled 2020-11-01: qty 500

## 2020-11-01 MED ORDER — PANTOPRAZOLE SODIUM 40 MG PO TBEC
40.0000 mg | DELAYED_RELEASE_TABLET | Freq: Two times a day (BID) | ORAL | Status: DC
  Administered 2020-11-01 – 2020-11-09 (×17): 40 mg via ORAL
  Filled 2020-11-01 (×17): qty 1

## 2020-11-01 MED ORDER — CHLORHEXIDINE GLUCONATE CLOTH 2 % EX PADS
6.0000 | MEDICATED_PAD | Freq: Every day | CUTANEOUS | Status: DC
  Administered 2020-11-07 – 2020-11-09 (×3): 6 via TOPICAL
  Filled 2020-11-01: qty 6

## 2020-11-01 MED ORDER — SODIUM CHLORIDE 0.9 % IV SOLN: 250.0000 mL | INTRAVENOUS | Status: DC | PRN

## 2020-11-01 MED ORDER — ALBUTEROL SULFATE (2.5 MG/3ML) 0.083% IN NEBU
2.5000 mg | INHALATION_SOLUTION | Freq: Three times a day (TID) | RESPIRATORY_TRACT | Status: DC | PRN
  Administered 2020-11-02: 2.5 mg via RESPIRATORY_TRACT
  Filled 2020-11-01: qty 3

## 2020-11-01 MED ORDER — SODIUM CHLORIDE 0.9 % IV SOLN
1.0000 g | Freq: Once | INTRAVENOUS | Status: AC
  Administered 2020-11-01: 1 g via INTRAVENOUS
  Filled 2020-11-01: qty 1

## 2020-11-01 MED ORDER — FUROSEMIDE 40 MG PO TABS: 40.0000 mg | ORAL_TABLET | Freq: Every day | ORAL | Status: DC

## 2020-11-01 MED ORDER — LIDOCAINE-PRILOCAINE 2.5-2.5 % EX CREA
1.0000 "application " | TOPICAL_CREAM | Freq: Once | CUTANEOUS | Status: AC
  Administered 2020-11-01: 1 via TOPICAL
  Filled 2020-11-01: qty 5

## 2020-11-01 MED ORDER — CARVEDILOL 25 MG PO TABS
25.0000 mg | ORAL_TABLET | Freq: Two times a day (BID) | ORAL | Status: DC
  Administered 2020-11-01 – 2020-11-09 (×15): 25 mg via ORAL
  Filled 2020-11-01 (×15): qty 1

## 2020-11-01 MED ORDER — IRBESARTAN 150 MG PO TABS
150.0000 mg | ORAL_TABLET | Freq: Every day | ORAL | Status: DC
  Administered 2020-11-02 – 2020-11-09 (×7): 150 mg via ORAL
  Filled 2020-11-01 (×9): qty 1

## 2020-11-01 MED ORDER — AMLODIPINE BESYLATE 5 MG PO TABS: 10.0000 mg | ORAL_TABLET | Freq: Every day | ORAL | Status: DC

## 2020-11-01 MED ORDER — RENA-VITE PO TABS
1.0000 | ORAL_TABLET | Freq: Every day | ORAL | Status: DC
  Administered 2020-11-01 – 2020-11-09 (×8): 1 via ORAL
  Filled 2020-11-01 (×10): qty 1

## 2020-11-01 MED ORDER — SODIUM CHLORIDE 0.9 % IV SOLN
2.0000 g | INTRAVENOUS | Status: AC
  Administered 2020-11-01 – 2020-11-05 (×5): 2 g via INTRAVENOUS
  Filled 2020-11-01 (×2): qty 20
  Filled 2020-11-01 (×2): qty 2
  Filled 2020-11-01: qty 20

## 2020-11-01 MED ORDER — ESCITALOPRAM OXALATE 10 MG PO TABS
10.0000 mg | ORAL_TABLET | Freq: Every day | ORAL | Status: DC
  Administered 2020-11-02 – 2020-11-09 (×8): 10 mg via ORAL
  Filled 2020-11-01 (×8): qty 1

## 2020-11-01 MED ORDER — ACETAMINOPHEN 325 MG PO TABS: 650.0000 mg | ORAL_TABLET | Freq: Four times a day (QID) | ORAL | Status: DC | PRN

## 2020-11-01 MED ORDER — GABAPENTIN 300 MG PO CAPS
300.0000 mg | ORAL_CAPSULE | Freq: Every day | ORAL | Status: DC
  Administered 2020-11-01 – 2020-11-08 (×8): 300 mg via ORAL
  Filled 2020-11-01 (×8): qty 1

## 2020-11-01 NOTE — ED Provider Notes (Signed)
[facility] Emergency Department Provider Note   ____________________________________________   First MD Initiated Contact with Patient 11/01/20 [phone]     (approximate)  I have reviewed the triage vital signs and the nursing notes.   HISTORY  Chief Complaint Shortness of Breath    HPI Deborah [name] is a 64 y.o. female with past medical history of hypertension, hyperlipidemia, diabetes, ESRD on HD, diastolic CHF, mitral valve replacement, paroxysmal A. fib on Eliquis, and tracheostomy who presents to the ED for shortness of breath.  Patient reports that she developed increasing difficulty breathing starting last night with cough and increased thick yellowish secretions through her tracheostomy.  She has been attempting to suction at home but feels like her tracheostomy has been getting clogged at times.  Her son was able to replace the tracheostomy earlier today and she feels slightly better since then.  EMS was called this morning and noted patient's initial O2 sat to be 89% on room air.  She was subsequently placed on a nonrebreather over her trach with improvement to 100%.  Patient denies any fevers, chest pain, vomiting, diarrhea, pain or swelling in her legs.  She has not had any sick contacts and is fully vaccinated against COVID-19.  She last underwent dialysis treatment 3 days ago, is due for another treatment today but has not missed any recent treatments.        Past Medical History:  Diagnosis Date  . Diabetes mellitus   . Hyperlipidemia   . Hypertension   . MI, old   . Renal disorder    dialysls    Patient Active Problem List   Diagnosis Date Noted  . Shortness of breath   . Fever   . Acute respiratory failure ([place]) 04/02/2020  . Acute CHF (congestive heart failure) ([place]) 10/17/2019  . Diabetic neuropathy ([place]) 10/16/2017  . Tracheobronchitis 10/11/2017  . Hemorrhage from arteriovenous dialysis graft ([place]) 10/05/2017  . Sepsis ([place])  09/08/2017  . Adjustment disorder with anxious mood 12/09/2016  . Bacteremia due to Staphylococcus aureus   . Staphylococcus aureus bacteremia with sepsis ([place])   . Type 2 diabetes mellitus with hyperlipidemia ([place])   . ST elevation myocardial infarction involving left main coronary artery ([place])   . Palliative care encounter   . Advance care planning   . Vocal cord dysfunction   . Palliative care by specialist   . Goals of care, counseling/discussion   . Bacteremia   . End-stage renal disease on hemodialysis ([place])   . Paroxysmal atrial fibrillation ([name])   . Heart attack ([place])   . Uncontrolled type 2 diabetes mellitus with complication, without long-term current use of insulin ([place])   . Staphylococcus aureus bacteremia 11/12/2016  . Vocal cord paralysis   . Respiratory distress 11/02/2016  . Chronic anticoagulation   . Acute lower UTI   . Labile blood glucose   . Leukocytosis   . Subtherapeutic international normalized ratio (INR)   . Supratherapeutic INR   . Cough   . Oropharyngeal dysphagia   . Uncontrolled type 2 diabetes mellitus with complication, with long-term current use of insulin ([place])   . Physical debility 10/11/2016  . Metabolic encephalopathy 61/95/0932  . Debility   . ESRD (end stage renal disease) ([place])   . Type 2 diabetes mellitus with complication, with long-term current use of insulin ([place])   . Anemia of chronic disease   . Coronary artery disease involving native coronary artery of native heart without angina pectoris   .  Acute on chronic respiratory failure with hypoxemia ([place])   . Atrial flutter ([place])   . Persistent cough   . Dysphonia   . Sleep disturbance   . Diabetes mellitus type 2 in nonobese ([name])   . Stage 3 chronic kidney disease ([place])   . NSTEMI (non-ST elevated myocardial infarction) ([place])   . Tachypnea   . Hyponatremia   . Lymphocytosis   . Acute blood loss anemia   . Pressure injury of skin 09/18/2016  . History of tracheostomy   .  Delirium   . Encounter for attention to tracheostomy ([place])   . History of MVR with cardiopulmonary bypass   . Systolic congestive heart failure ([place])   . HCAP (healthcare-associated pneumonia)   . PAF (paroxysmal atrial fibrillation) ([place])   . Acute systolic congestive heart failure ([place])   . AKI (acute kidney injury) ([place])   . Abnormal LFTs   . S/P MVR (mitral valve replacement)   . Mitral regurgitation 08/23/2016  . Acute on chronic diastolic heart failure ([place]) 08/18/2016  . Acute pulmonary edema ([name])   . Respiratory failure ([place]) 08/17/2016  . Cardiogenic shock ([place]) 08/17/2016  . Acute hypoxemic respiratory failure ([place])   . STEMI (ST elevation myocardial infarction) ([place]) 08/15/2016  . Noncompliance 01/25/2015  . Bereavement 07/09/2014  . Low back pain 02/17/2014  . Cervical cancer screening 11/20/2013  . Routine general medical examination at a health care facility 10/14/2013  . Chronic kidney disease, stage 3 ([place]) 06/14/2012  . Hypertension 03/08/2012  . Diabetes mellitus type 2, controlled 03/08/2012  . Hyperlipidemia 03/08/2012    Past Surgical History:  Procedure Laterality Date  . A/V FISTULAGRAM N/A 10/05/2017   Procedure: A/V Fistulagram;  Surgeon: Katha Cabal, MD;  Location: [facility];  Service: Cardiovascular;  Laterality: N/A;  . A/V SHUNTOGRAM N/A 10/05/2017   Procedure: A/V SHUNTOGRAM;  Surgeon: Katha Cabal, MD;  Location: [facility];  Service: Cardiovascular;  Laterality: N/A;  . AV FISTULA PLACEMENT Left 10/02/2016   Procedure: INSERTION OF ARTERIOVENOUS (AV) GORE-TEX GRAFT ARM;  Surgeon: Waynetta Sandy, MD;  Location: [facility];  Service: Vascular;  Laterality: Left;  . CARDIAC CATHETERIZATION N/A 08/15/2016   Procedure: Left Heart Cath and Coronary Angiography;  Surgeon: Yolonda Kida, MD;  Location: [facility];  Service: Cardiovascular;  Laterality: N/A;  . CARDIAC CATHETERIZATION N/A 08/15/2016    Procedure: Coronary Stent Intervention;  Surgeon: Yolonda Kida, MD;  Location: [facility];  Service: Cardiovascular;  Laterality: N/A;  . CARDIAC CATHETERIZATION N/A 08/18/2016   Procedure: Right Heart Cath;  Surgeon: Jolaine Artist, MD;  Location: [facility];  Service: Cardiovascular;  Laterality: N/A;  . CARDIAC CATHETERIZATION N/A 08/18/2016   Procedure: IABP Insertion;  Surgeon: Jolaine Artist, MD;  Location: [facility];  Service: Cardiovascular;  Laterality: N/A;  . CARDIAC CATHETERIZATION Right 08/23/2016   Procedure: CENTRAL LINE INSERTION RIGHT SUBCLAVIAN;  Surgeon: Ivin Poot, MD;  Location: [facility];  Service: Open Heart Surgery;  Laterality: Right;  . ENDOVEIN HARVEST OF GREATER SAPHENOUS VEIN Left 08/23/2016   Procedure: ENDOVEIN HARVEST OF GREATER SAPHENOUS VEIN;  Surgeon: Ivin Poot, MD;  Location: [facility];  Service: Open Heart Surgery;  Laterality: Left;  . INSERTION OF DIALYSIS CATHETER Bilateral 08/31/2016   Procedure: INSERTION OF DIALYSIS CATHETER LEFT INTERNAL JUGULAR VEIN & INSERTION OF TRIPLE LUMEN RIGHT INTERNAL JUGULAR VEIN;  Surgeon: Angelia Mould, MD;  [facility];  Service: Vascular;  Laterality: Bilateral;  . INTRAOPERATIVE TRANSESOPHAGEAL ECHOCARDIOGRAM N/A 08/23/2016   Procedure: INTRAOPERATIVE TRANSESOPHAGEAL ECHOCARDIOGRAM;  Surgeon: Ivin Poot, MD;  Location: [facility];  Service: Open Heart Surgery;  Laterality: N/A;  . IR GENERIC HISTORICAL  11/13/2016   IR US GUIDE VASC ACCESS LEFT 11/13/2016 Corrie Mckusick, DO [facility]  . IR GENERIC HISTORICAL  11/13/2016   IR FLUORO GUIDE CV LINE LEFT 11/13/2016 Corrie Mckusick, DO [facility]  . IR GENERIC HISTORICAL  11/13/2016   IR GASTROSTOMY TUBE MOD SED 11/13/2016 Corrie Mckusick, DO [facility]  . MITRAL VALVE REPAIR N/A 08/23/2016   Procedure: MITRAL VALVE REPAIR (MVR) USING 25MM EDWARDS MAGNA EASE BIOPROSTHESIS MITRAL  VALVE;  Surgeon: Ivin Poot, MD;  Location:  [facility];  Service: Open Heart Surgery;  Laterality: N/A;  . tracheostomy reversal    . TRACHEOSTOMY TUBE PLACEMENT N/A 11/09/2016   Procedure: TRACHEOSTOMY;  Surgeon: Melissa Montane, MD;  Location: [place] [facility] [facility];  Service: ENT;  Laterality: N/A;  . VAGINAL DELIVERY     x 6    Prior to Admission medications   Medication Sig Start Date End Date Taking? Authorizing Provider  acetaminophen (TYLENOL) 325 MG tablet Take 650 mg by mouth. 04/06/17   [provider]  albuterol (PROVENTIL) (2.5 MG/3ML) 0.083% nebulizer solution Take 3 mLs (2.5 mg total) by nebulization 4 (four) times daily - after meals and at bedtime. Patient taking differently: Take 2.5 mg by nebulization 3 (three) times daily as needed.  11/01/16   Love, Ivan Anchors, PA-C  amLODipine (NORVASC) 5 MG tablet Take 10 mg by mouth daily. 01/08/20   [provider]  apixaban (ELIQUIS) 2.5 MG TABS tablet Take 2.5 mg by mouth 2 (two) times daily.  06/22/17   [provider]  aspirin 81 MG EC tablet Take 1 tablet (81 mg total) by mouth daily. 10/24/19   Swayze, Ava, DO  atorvastatin (LIPITOR) 80 MG tablet Take 80 mg by mouth daily at 6 PM.     [provider]  B Complex-C-Folic Acid (RENA-VITE RX) 1 MG TABS Take 1 tablet by mouth daily. 09/01/19   [provider]  carvedilol (COREG) 25 MG tablet Take 25 mg by mouth 2 (two) times daily. 04/22/18   [provider]  furosemide (LASIX) 40 MG tablet Take 40 mg by mouth daily. ON NON dialysis days, Sunday, Tuesday, Thursday, Saturday    [provider]  gabapentin (NEURONTIN) 300 MG capsule Take 1 capsule (300 mg total) by mouth at bedtime. 04/04/20   Samuella Cota, MD  LEVEMIR FLEXTOUCH 100 UNIT/ML FlexPen Inject 13 Units into the skin at bedtime. 03/05/20   [provider]  lidocaine-prilocaine (EMLA) cream Apply 1 application topically every dialysis. 09/26/19   [provider]  midodrine (PROAMATINE) 5 MG tablet Take 5 mg by mouth 3  (three) times a week.    [provider]  olmesartan (BENICAR) 20 MG tablet Take 20 mg by mouth daily. 03/12/20   [provider]  pantoprazole (PROTONIX) 40 MG tablet Take 1 tablet (40 mg total) by mouth 2 (two) times daily. 11/01/16   Love, Ivan Anchors, PA-C  sevelamer carbonate (RENVELA) 800 MG tablet Take 1,600 mg by mouth 3 (three) times daily with meals.    [provider]  traZODone (DESYREL) 50 MG tablet Take 50 mg by mouth 3 (three) times a week. Take on Dialysis days 04/04/18   [provider]    Allergies  Patient has no known allergies.  Family History  Problem Relation Age of Onset  . Hypertension Mother   . Diabetes Mother   . Breast cancer Sister   . Hypertension Father     Social History Social History   Tobacco Use  . Smoking status: Never Smoker  . Smokeless tobacco: Never Used  Substance Use Topics  . Alcohol use: No  . Drug use: No    Review of Systems  Constitutional: No fever/chills Eyes: No visual changes. ENT: No sore throat. Cardiovascular: Denies chest pain. Respiratory: Positive for cough and shortness of breath. Gastrointestinal: No abdominal pain.  No nausea, no vomiting.  No diarrhea.  No constipation. Genitourinary: Negative for dysuria. Musculoskeletal: Negative for back pain. Skin: Negative for rash. Neurological: Negative for headaches, focal weakness or numbness.  ____________________________________________   PHYSICAL EXAM:  VITAL SIGNS: ED Triage Vitals  Enc Vitals Group     BP --      Pulse Rate 11/01/20 0910 76     Resp 11/01/20 0910 18     Temp 11/01/20 0910 99 F (37.2 C)     Temp Source 11/01/20 0910 Oral     SpO2 11/01/20 0910 95 %     Weight 11/01/20 0911 136 lb 14.5 oz (62.1 kg)     Height 11/01/20 0911 5\' 2"  (1.575 m)     Head Circumference --      Peak Flow --      Pain Score 11/01/20 0908 0     Pain Loc --      Pain Edu? --      Excl. in [place]? --     Constitutional: Alert and  oriented. Eyes: Conjunctivae are normal. Head: Atraumatic. Nose: No congestion/rhinnorhea. Mouth/Throat: Mucous membranes are moist. Neck: Normal ROM Cardiovascular: Normal rate, regular rhythm.  Systolic murmur noted.  Left upper extremity AV fistula with palpable thrill. Respiratory: Mild tachypnea with normal respiratory effort.  No retractions. Lungs CTAB. Gastrointestinal: Soft and nontender. No distention. Genitourinary: deferred Musculoskeletal: No lower extremity tenderness nor edema. Neurologic:  Normal speech and language. No gross focal neurologic deficits are appreciated. Skin:  Skin is warm, dry and intact. No rash noted. Psychiatric: Mood and affect are normal. Speech and behavior are normal.  ____________________________________________   LABS (all labs ordered are listed, but only abnormal results are displayed)  Labs Reviewed  BASIC METABOLIC PANEL - Abnormal; Notable for the following components:      Result Value   Glucose, Bld 155 (*)    BUN 72 (*)    Creatinine, Ser 8.66 (*)    GFR, Estimated 5 (*)    All other components within normal limits  CBC - Abnormal; Notable for the following components:   WBC 12.3 (*)    RBC 3.85 (*)    Hemoglobin 10.8 (*)    HCT 32.6 (*)    RDW 16.6 (*)    All other components within normal limits  BRAIN NATRIURETIC PEPTIDE - Abnormal; Notable for the following components:   B Natriuretic Peptide 942.5 (*)    All other components within normal limits  RESP PANEL BY RT-PCR (FLU A&B, COVID) ARPGX2  TROPONIN I (HIGH SENSITIVITY)  TROPONIN I (HIGH SENSITIVITY)   ____________________________________________  EKG  ED ECG REPORT I, Blake Divine, the attending physician, personally viewed and interpreted this ECG.   Date: 11/01/2020  EKG Time: 9:10  Rate: 75  Rhythm: normal sinus rhythm  Axis: Normal  Intervals:none  ST&T Change: None  PROCEDURES  Procedure(s) performed (including Critical Care):  .Critical  Care Performed by: Blake Divine, MD Authorized by: Blake Divine, MD   Critical care provider statement:    Critical care time (minutes):  45   Critical care time was exclusive of:  Separately billable procedures and treating other patients and teaching time   Critical care was necessary to treat or prevent imminent or life-threatening deterioration of the following conditions:  Respiratory failure   Critical care was time spent personally by me on the following activities:  Discussions with consultants, evaluation of patient's response to treatment, examination of patient, ordering and performing treatments and interventions, ordering and review of laboratory studies, ordering and review of radiographic studies, pulse oximetry, re-evaluation of patient's condition, obtaining history from patient or surrogate and review of old charts   I assumed direction of critical care for this patient from another provider in my specialty: no       ____________________________________________   INITIAL IMPRESSION / ASSESSMENT AND PLAN / ED COURSE       64 year old female with past medical history of hypertension, hyperlipidemia, ESRD on HD, diastolic CHF, mitral valve replacement, paroxysmal A. fib on Eliquis, and tracheostomy for prolonged ventilator dependence presents to the ED for increasing shortness of breath with productive cough and copious secretions via trach.  She is not in any respiratory distress, is mildly tachypneic but appears to be maintaining O2 sats on room air for now.  Given increased secretions, I am concerned for pneumonia versus bronchitis versus COVID-19.  We will check chest x-ray as well as testing for COVID-19 and flu.  Low suspicion for cardiac etiology or PE at this time.  Chest x-ray reviewed by me, shows opacity in the right lower lobe concerning for pneumonia, especially given patient's productive cough.  We will treat with cefepime and azithromycin, testing for  COVID-19 is negative.  Patient now requiring supplemental oxygen via trach collar but maintaining O2 sats on 6 L.  Case discussed with hospitalist for admission.      ____________________________________________   FINAL CLINICAL IMPRESSION(S) / ED DIAGNOSES  Final diagnoses:  Acute respiratory failure with hypoxia ([place])  Community acquired pneumonia, unspecified laterality     ED Discharge Orders    None       Note:  This document was prepared using Dragon voice recognition software and may include unintentional dictation errors.   Blake Divine, MD 11/01/20 1108

## 2020-11-01 NOTE — ED Notes (Signed)
Meal tray ordered for patient . Admitting MD advised to hold bp meds , and order meal tray

## 2020-11-01 NOTE — ED Triage Notes (Signed)
Pt arrives EMS for SOB. Dialysis pt, fistula noted to L arm. Pt has trach from 2017. States runny nose and congestion. Pt goes MWF dialysis, last treatment was Friday. No missed treatments other than this AM. Denies CP. On room air at 100%. A&O, talking in complete sentences.

## 2020-11-01 NOTE — Progress Notes (Signed)
[facility], [place] 11/01/20  Subjective:   LOS: 0   Patient known to our practice from previous admissions Presents for increasing cough, sputum and SOB Dx with pneumonia by CXR  Admitted for further eval and management   Objective:  Vital signs in last 24 hours:  Temp:  [99 F (37.2 C)] 99 F (37.2 C) (11/29 0910) Pulse Rate:  [72-76] 72 (11/29 1230) Resp:  [12-23] 23 (11/29 1230) BP: (152-174)/(88-91) 152/91 (11/29 1230) SpO2:  [87 %-97 %] 96 % (11/29 1230) FiO2 (%):  [35 %] 35 % (11/29 1010) Weight:  [62.1 kg] 62.1 kg (11/29 0911)  Weight change:  Filed Weights   11/01/20 0911  Weight: 62.1 kg    Intake/Output:   No intake or output data in the 24 hours ending 11/01/20 1507  Physical Exam: General:  No acute distress, laying in the bed  HEENT  anicteric, moist oral mucous membrane  Pulm/lungs  chronic trach in place, coarse rhonchi at rt base  CVS/Heart  regular rhythm, no rub or gallop  Abdomen:   Soft, nontender  Extremities:  No peripheral edema  Neurologic:  Alert, oriented, able to follow commands  Skin:  No acute rashes  Left arm AVG  Basic Metabolic Panel:  Recent Labs  Lab 11/01/20 0912  NA 138  K 4.8  CL 98  CO2 26  GLUCOSE 155*  BUN 72*  CREATININE 8.66*  CALCIUM 9.6     CBC: Recent Labs  Lab 10/28/20 1328 11/01/20 0912  WBC 7.6 12.3*  HGB 11.4* 10.8*  HCT 34.4* 32.6*  MCV 84.3 84.7  PLT 175 209      Lab Results  Component Value Date   HEPBSAG NON REACTIVE 06/26/2020   HEPBSAB Non Reactive 08/17/2016   HEPBIGM Negative 08/26/2016      Microbiology:  Recent Results (from the past 240 hour(s))  Resp Panel by RT-PCR (Flu A&B, Covid) Nasopharyngeal Swab     Status: None   Collection Time: 11/01/20  9:22 AM   Specimen: Nasopharyngeal Swab; Nasopharyngeal(NP) swabs in vial transport medium  Result Value Ref Range Status   SARS Coronavirus 2 by RT PCR NEGATIVE NEGATIVE Final    Comment:  (NOTE) SARS-CoV-2 target nucleic acids are NOT DETECTED.  The SARS-CoV-2 RNA is generally detectable in upper respiratory specimens during the acute phase of infection. The lowest concentration of SARS-CoV-2 viral copies this assay can detect is 138 copies/mL. A negative result does not preclude SARS-Cov-2 infection and should not be used as the sole basis for treatment or other patient management decisions. A negative result may occur with  improper specimen collection/handling, submission of specimen other than nasopharyngeal swab, presence of viral mutation(s) within the areas targeted by this assay, and inadequate number of viral copies(<138 copies/mL). A negative result must be combined with clinical observations, patient history, and epidemiological information. The expected result is Negative.  Fact Sheet for Patients:  EntrepreneurPulse.com.au  Fact Sheet for Healthcare Providers:  IncredibleEmployment.be  This test is no t yet approved or cleared by the [place] FDA and  has been authorized for detection and/or diagnosis of SARS-CoV-2 by FDA under an Emergency Use Authorization (EUA). This EUA will remain  in effect (meaning this test can be used) for the duration of the COVID-19 declaration under Section 564(b)(1) of the Act, 21 U.S.C.section 360bbb-3(b)(1), unless the authorization is terminated  or revoked sooner.       Influenza A by PCR NEGATIVE NEGATIVE Final   Influenza  B by PCR NEGATIVE NEGATIVE Final    Comment: (NOTE) The Xpert Xpress SARS-CoV-2/FLU/RSV plus assay is intended as an aid in the diagnosis of influenza from Nasopharyngeal swab specimens and should not be used as a sole basis for treatment. Nasal washings and aspirates are unacceptable for Xpert Xpress SARS-CoV-2/FLU/RSV testing.  Fact Sheet for Patients: EntrepreneurPulse.com.au  Fact Sheet for Healthcare  Providers: IncredibleEmployment.be  This test is not yet approved or cleared by the [place] FDA and has been authorized for detection and/or diagnosis of SARS-CoV-2 by FDA under an Emergency Use Authorization (EUA). This EUA will remain in effect (meaning this test can be used) for the duration of the COVID-19 declaration under Section 564(b)(1) of the Act, 21 U.S.C. section 360bbb-3(b)(1), unless the authorization is terminated or revoked.  Performed at [facility], [address] [place], [place] [address]     Coagulation Studies: No results for input(s): LABPROT, INR in the last 72 hours.  Urinalysis: No results for input(s): COLORURINE, LABSPEC, PHURINE, GLUCOSEU, HGBUR, BILIRUBINUR, KETONESUR, PROTEINUR, UROBILINOGEN, NITRITE, LEUKOCYTESUR in the last 72 hours.  Invalid input(s): APPERANCEUR    Imaging: DG Chest 2 View  Result Date: 11/01/2020 CLINICAL DATA:  Shortness of breath. EXAM: CHEST - 2 VIEW COMPARISON:  11/01/2028 FINDINGS: Sternotomy wires, prosthetic heart valve and tracheostomy tube unchanged. Lungs are adequately inflated and demonstrate hazy opacification over the right base which may be due to combination layering pleural fluid and atelectasis although infection is possible. There is mild hazy prominence of the central pulmonary vessels likely mild degree of vascular congestion. Stable cardiomegaly. Remainder of the exam is unchanged. IMPRESSION: 1. Hazy opacification over the right base which may be due to combination of layering pleural fluid and atelectasis, although infection is possible. 2. Cardiomegaly with suggestion of mild vascular congestion. Electronically Signed   By: Marin Olp M.D.   On: 11/01/2020 09:44     Medications:   . sodium chloride    . [START ON 11/02/2020] azithromycin    . cefTRIAXone (ROCEPHIN)  IV     . apixaban  2.5 mg Oral BID  . aspirin EC  81 mg Oral Daily  . atorvastatin  80 mg Oral  q1800  . carvedilol  25 mg Oral BID  . [START ON 11/02/2020] Chlorhexidine Gluconate Cloth  6 each Topical Q0600  . escitalopram  10 mg Oral Daily  . [START ON 11/02/2020] furosemide  40 mg Oral Once per day on Sun Tue Thu Sat  . gabapentin  300 mg Oral QHS  . insulin aspart  0-9 Units Subcutaneous TID WC  . irbesartan  150 mg Oral Daily  . midodrine  5 mg Oral Once per day on Mon Wed Fri  . multivitamin  1 tablet Oral Daily  . pantoprazole  40 mg Oral BID  . sevelamer carbonate  1,600 mg Oral TID WC  . sodium chloride flush  3 mL Intravenous Q12H  . traZODone  50 mg Oral Once per day on Mon Wed Fri   sodium chloride, acetaminophen, albuterol, sodium chloride flush  Assessment/ Plan:  64 y.o. female with  was admitted on 11/01/2020 for  Principal Problem:   Acute hypoxemic respiratory failure ([name]) Active Problems:   Hypertension   Diabetes mellitus type 2, controlled ([place])   Acute on chronic diastolic heart failure ([name])   ESRD (end stage renal disease) ([name])   Atrial flutter ([name])   Acute diastolic CHF (congestive heart failure) ([place])   Tracheostomy status ([place])  CAP (community acquired pneumonia)  Acute diastolic CHF (congestive heart failure) ([place]) [I50.31]  #. ESRD Will arrange for HD today; orders prepared Patient appears euvolemic.  K 4.8  #. Anemia of CKD  Lab Results  Component Value Date   HGB 10.8 (L) 11/01/2020   Low dose EPO with HD if trends lower  #. Secondary hyperparathyroidism of renal origin N 25.81      Component Value Date/Time   PTH 270 (H) 10/03/2017 0949   Lab Results  Component Value Date   PHOS 4.9 (H) 04/03/2020   Monitor calcium and phos level during this admission   #. Diabetes type 2 with CKD Hgb A1c MFr Bld (%)  Date Value  04/02/2020 6.4 (H)    # Pneumonia antibiotics per IM team   LOS: 0 Deyona Soza 11/29/20213:07 PM  [facility] [employer], [place]

## 2020-11-01 NOTE — ED Notes (Signed)
Spoke to lab about running the A1c

## 2020-11-01 NOTE — H&P (Addendum)
History and Physical    Deborah [name] GNF:[id number] DOB: 1956/03/13 DOA: 11/01/2020  PCP: Leta Speller, MD   Patient coming from: Home  I have personally briefly reviewed patient's old medical records in [place]  Chief Complaint: Shortness of breath  HPI: Deborah [name] is a 64 y.o. female with medical history significant for hypertension, end-stage renal disease on hemodialysis, chronic diastolic dysfunction CHF, status post mitral valve replacement, paroxysmal atrial fibrillation on anticoagulation therapy with Eliquis as well as tracheostomy status who presents to the ER for evaluation of shortness of breath and increased yellowish secretions through her tracheostomy.  Patient states that she developed shortness of breath the night prior to her hospitalization associated with a cough and increased secretions through her tracheostomy.  She had attempted to suction herself at home but her tracheostomy kept getting clogged.  Her son states that he checked her pulse oximetry due to her complaints of shortness of breath and she was noted to have room air pulse oximetry in the 80s.  EMS was called and when they arrived patient had room air pulse oximetry of 89% that improved following treatment, administration.  She is currently on 5 L trach collar. She denies having any fever or chills, she has no chest pain, no nausea, no vomiting, no abdominal pain, no lower extremity swelling, no changes in her bowel habits or headaches. Patient is fully vaccinated against the COVID-19 virus. Labs show sodium 138, potassium 4.8, chloride 98, bicarb 26, glucose 155, BUN 72, creatinine 8.6, calcium 9.6, BNP 942, troponin XIV, white count 12.3, hemoglobin 10.86, MCV 84.7, RDW 16.6, platelet count 209 Respiratory viral panel is negative Chest x-ray reviewed by meshows hazy opacification over the right base which may be due to combination of layering pleural fluid and atelectasis, although infection is  possible.  Cardiomegaly with mild vascular congestion. Twelve-lead EKG shows sinus rhythm with RVH    ED Course: Patient is a 64 year old African-American female who presents to the ER for evaluation of shortness of breath, cough and increased yellowish secretions through her tracheostomy.  Patient was said to have room air pulse oximetry of 89% and she is currently on 5 L of oxygen with pulse oximetry in the high 90s.  Chest x-ray suggestive of possible right lower lobe pneumonia as well as CHF.  Patient is scheduled for renal replacement therapy today.  She will be admitted to the hospital for further evaluation.    Review of Systems: As per HPI otherwise 10 point review of systems negative.    Past Medical History:  Diagnosis Date  . Diabetes mellitus   . Hyperlipidemia   . Hypertension   . MI, old   . Renal disorder    dialysls    Past Surgical History:  Procedure Laterality Date  . A/V FISTULAGRAM N/A 10/05/2017   Procedure: A/V Fistulagram;  Surgeon: Katha Cabal, MD;  Location: [place] [facility];  Service: Cardiovascular;  Laterality: N/A;  . A/V SHUNTOGRAM N/A 10/05/2017   Procedure: A/V SHUNTOGRAM;  Surgeon: Katha Cabal, MD;  Location: [place] [facility];  Service: Cardiovascular;  Laterality: N/A;  . AV FISTULA PLACEMENT Left 10/02/2016   Procedure: INSERTION OF ARTERIOVENOUS (AV) GORE-TEX GRAFT ARM;  Surgeon: Waynetta Sandy, MD;  Location: [place];  Service: Vascular;  Laterality: Left;  . CARDIAC CATHETERIZATION N/A 08/15/2016   Procedure: Left Heart Cath and Coronary Angiography;  Surgeon: Yolonda Kida, MD;  Location: [place] [facility];  Service: Cardiovascular;  Laterality: N/A;  . CARDIAC CATHETERIZATION N/A 08/15/2016   Procedure: Coronary Stent Intervention;  Surgeon: Yolonda Kida, MD;  Location: [place] [facility];  Service: Cardiovascular;  Laterality: N/A;  . CARDIAC CATHETERIZATION N/A 08/18/2016   Procedure: Right Heart  Cath;  Surgeon: Jolaine Artist, MD;  Location: [place] [facility];  Service: Cardiovascular;  Laterality: N/A;  . CARDIAC CATHETERIZATION N/A 08/18/2016   Procedure: IABP Insertion;  Surgeon: Jolaine Artist, MD;  Location: [place] [facility];  Service: Cardiovascular;  Laterality: N/A;  . CARDIAC CATHETERIZATION Right 08/23/2016   Procedure: CENTRAL LINE INSERTION RIGHT SUBCLAVIAN;  Surgeon: Ivin Poot, MD;  Location: [place];  Service: Open Heart Surgery;  Laterality: Right;  . ENDOVEIN HARVEST OF GREATER SAPHENOUS VEIN Left 08/23/2016   Procedure: ENDOVEIN HARVEST OF GREATER SAPHENOUS VEIN;  Surgeon: Ivin Poot, MD;  Location: [place];  Service: Open Heart Surgery;  Laterality: Left;  . INSERTION OF DIALYSIS CATHETER Bilateral 08/31/2016   Procedure: INSERTION OF DIALYSIS CATHETER LEFT INTERNAL JUGULAR VEIN & INSERTION OF TRIPLE LUMEN RIGHT INTERNAL JUGULAR VEIN;  Surgeon: Angelia Mould, MD;  Location: [place];  Service: Vascular;  Laterality: Bilateral;  . INTRAOPERATIVE TRANSESOPHAGEAL ECHOCARDIOGRAM N/A 08/23/2016   Procedure: INTRAOPERATIVE TRANSESOPHAGEAL ECHOCARDIOGRAM;  Surgeon: Ivin Poot, MD;  Location: [place];  Service: Open Heart Surgery;  Laterality: N/A;  . IR GENERIC HISTORICAL  11/13/2016   IR US GUIDE VASC ACCESS LEFT 11/13/2016 Corrie Mckusick, DO [facility]  . IR GENERIC HISTORICAL  11/13/2016   IR FLUORO GUIDE CV LINE LEFT 11/13/2016 Corrie Mckusick, DO [facility]  . IR GENERIC HISTORICAL  11/13/2016   IR GASTROSTOMY TUBE MOD SED 11/13/2016 Corrie Mckusick, DO [facility]  . MITRAL VALVE REPAIR N/A 08/23/2016   Procedure: MITRAL VALVE REPAIR (MVR) USING 25MM EDWARDS MAGNA EASE BIOPROSTHESIS MITRAL  VALVE;  Surgeon: Ivin Poot, MD;  Location: [place];  Service: Open Heart Surgery;  Laterality: N/A;  . tracheostomy reversal    . TRACHEOSTOMY TUBE PLACEMENT N/A 11/09/2016   Procedure: TRACHEOSTOMY;  Surgeon: Melissa Montane, MD;  Location: [place];  Service: ENT;   Laterality: N/A;  . VAGINAL DELIVERY     x 6     reports that she has never smoked. She has never used smokeless tobacco. She reports that she does not drink alcohol and does not use drugs.  No Known Allergies  Family History  Problem Relation Age of Onset  . Hypertension Mother   . Diabetes Mother   . Breast cancer Sister   . Hypertension Father      Prior to Admission medications   Medication Sig Start Date End Date Taking? Authorizing Provider  acetaminophen (TYLENOL) 325 MG tablet Take 650 mg by mouth. 04/06/17   [provider]  albuterol (PROVENTIL) (2.5 MG/3ML) 0.083% nebulizer solution Take 3 mLs (2.5 mg total) by nebulization 4 (four) times daily - after meals and at bedtime. Patient taking differently: Take 2.5 mg by nebulization 3 (three) times daily as needed.  11/01/16   Love, Ivan Anchors, PA-C  amLODipine (NORVASC) 10 MG tablet Take 10 mg by mouth daily. 08/12/20   [provider]  apixaban (ELIQUIS) 2.5 MG TABS tablet Take 2.5 mg by mouth 2 (two) times daily.  06/22/17   [provider]  aspirin 81 MG EC tablet Take 1 tablet (81 mg total) by mouth daily. 10/24/19   Swayze, Ava, DO  atorvastatin (LIPITOR) 80 MG tablet Take 80 mg  by mouth daily at 6 PM.     [provider]  carvedilol (COREG) 25 MG tablet Take 25 mg by mouth 2 (two) times daily. 04/22/18   [provider]  escitalopram (LEXAPRO) 10 MG tablet Take 10 mg by mouth daily. 09/13/20   [provider]  furosemide (LASIX) 40 MG tablet Take 40 mg by mouth daily. ON NON dialysis days, Sunday, Tuesday, Thursday, Saturday    [provider]  gabapentin (NEURONTIN) 300 MG capsule Take 1 capsule (300 mg total) by mouth at bedtime. 04/04/20   Samuella Cota, MD  LEVEMIR FLEXTOUCH 100 UNIT/ML FlexPen Inject 13 Units into the skin at bedtime. 03/05/20   [provider]  lidocaine-prilocaine (EMLA) cream Apply 1 application topically every dialysis. 09/26/19    [provider]  midodrine (PROAMATINE) 5 MG tablet Take 5 mg by mouth 3 (three) times a week.    [provider]  multivitamin (RENA-VIT) TABS tablet Take 1 tablet by mouth daily. 08/09/20   [provider]  olmesartan (BENICAR) 20 MG tablet Take 20 mg by mouth daily. 03/12/20   [provider]  pantoprazole (PROTONIX) 40 MG tablet Take 1 tablet (40 mg total) by mouth 2 (two) times daily. 11/01/16   Love, Ivan Anchors, PA-C  sevelamer carbonate (RENVELA) 800 MG tablet Take 1,600 mg by mouth 3 (three) times daily with meals.    [provider]  traZODone (DESYREL) 50 MG tablet Take 50 mg by mouth 3 (three) times a week. Take on Dialysis days 04/04/18   [provider]    Physical Exam: Vitals:   11/01/20 1045 11/01/20 1115 11/01/20 1145 11/01/20 1215  BP:      Pulse: 73 75 73 73  Resp: (!) 22 12 14 15   Temp:      TempSrc:      SpO2: 92% 95% 96% 95%  Weight:      Height:         Vitals:   11/01/20 1045 11/01/20 1115 11/01/20 1145 11/01/20 1215  BP:      Pulse: 73 75 73 73  Resp: (!) 22 12 14 15   Temp:      TempSrc:      SpO2: 92% 95% 96% 95%  Weight:      Height:        Constitutional: NAD, alert and oriented x 3.  Eyes: PERRL, lids and conjunctivae pallor ENMT: Mucous membranes are moist.  Neck: normal, supple, no masses, no thyromegaly, tracheostomy in place Respiratory: coarse breath sounds over the right lung base, no wheezing, faint crackles bilaterally. Normal respiratory effort. No accessory muscle use.  Cardiovascular: Regular rate and rhythm, no murmurs / rubs / gallops. No extremity edema. 2+ pedal pulses. No carotid bruits.  Abdomen: no tenderness, no masses palpated. No hepatosplenomegaly. Bowel sounds positive.  Musculoskeletal: no clubbing / cyanosis. No joint deformity upper and lower extremities.  Skin: no rashes, lesions, ulcers.  Neurologic: No gross focal neurologic deficit. Psychiatric: Normal mood and  affect.   Labs on Admission: I have personally reviewed following labs and imaging studies  CBC: Recent Labs  Lab 10/28/20 1328 11/01/20 0912  WBC 7.6 12.3*  HGB 11.4* 10.8*  HCT 34.4* 32.6*  MCV 84.3 84.7  PLT 175 948   Basic Metabolic Panel: Recent Labs  Lab 11/01/20 0912  NA 138  K 4.8  CL 98  CO2 26  GLUCOSE 155*  BUN 72*  CREATININE 8.66*  CALCIUM 9.6   GFR:  Estimated Creatinine Clearance: 5.8 mL/min (A) (by C-G formula based on SCr of 8.66 mg/dL (H)). Liver Function Tests: No results for input(s): AST, ALT, ALKPHOS, BILITOT, PROT, ALBUMIN in the last 168 hours. No results for input(s): LIPASE, AMYLASE in the last 168 hours. No results for input(s): AMMONIA in the last 168 hours. Coagulation Profile: No results for input(s): INR, PROTIME in the last 168 hours. Cardiac Enzymes: No results for input(s): CKTOTAL, CKMB, CKMBINDEX, TROPONINI in the last 168 hours. BNP (last 3 results) No results for input(s): PROBNP in the last 8760 hours. HbA1C: No results for input(s): HGBA1C in the last 72 hours. CBG: No results for input(s): GLUCAP in the last 168 hours. Lipid Profile: No results for input(s): CHOL, HDL, LDLCALC, TRIG, CHOLHDL, LDLDIRECT in the last 72 hours. Thyroid Function Tests: No results for input(s): TSH, T4TOTAL, FREET4, T3FREE, THYROIDAB in the last 72 hours. Anemia Panel: No results for input(s): VITAMINB12, FOLATE, FERRITIN, TIBC, IRON, RETICCTPCT in the last 72 hours. Urine analysis:    Component Value Date/Time   COLORURINE YELLOW (A) 09/09/2017 1056   APPEARANCEUR HAZY (A) 09/09/2017 1056   LABSPEC 1.013 09/09/2017 1056   PHURINE 7.0 09/09/2017 1056   GLUCOSEU NEGATIVE 09/09/2017 1056   HGBUR NEGATIVE 09/09/2017 [place] 09/09/2017 1056   KETONESUR NEGATIVE 09/09/2017 1056   PROTEINUR 100 (A) 09/09/2017 1056   NITRITE NEGATIVE 09/09/2017 1056   LEUKOCYTESUR MODERATE (A) 09/09/2017 1056    Radiological Exams on  Admission: DG Chest 2 View  Result Date: 11/01/2020 CLINICAL DATA:  Shortness of breath. EXAM: CHEST - 2 VIEW COMPARISON:  11/01/2028 FINDINGS: Sternotomy wires, prosthetic heart valve and tracheostomy tube unchanged. Lungs are adequately inflated and demonstrate hazy opacification over the right base which may be due to combination layering pleural fluid and atelectasis although infection is possible. There is mild hazy prominence of the central pulmonary vessels likely mild degree of vascular congestion. Stable cardiomegaly. Remainder of the exam is unchanged. IMPRESSION: 1. Hazy opacification over the right base which may be due to combination of layering pleural fluid and atelectasis, although infection is possible. 2. Cardiomegaly with suggestion of mild vascular congestion. Electronically Signed   By: Marin Olp M.D.   On: 11/01/2020 09:44    EKG: Independently reviewed.  Sinus rhythm with RVH  Assessment/Plan Principal Problem:   Acute hypoxemic respiratory failure ([name]) Active Problems:   Hypertension   Diabetes mellitus type 2, controlled ([name])   Acute on chronic diastolic heart failure ([name])   ESRD (end stage renal disease) ([name])   Atrial flutter ([name])   Acute diastolic CHF (congestive heart failure) ([place])   Tracheostomy status ([place])   CAP (community acquired pneumonia)     Acute respiratory failure Multifactorial and may be related to acute on chronic diastolic dysfunction CHF as well as community-acquired pneumonia Patient was noted to have room air pulse oximetry between 87 - 89% Patient is currently on 5 L of oxygen via trach collar with improvement in her pulse oximetry to the high 90s We will attempt to wean patient off oxygen as tolerated    Acute on chronic diastolic heart failure Patient has end-stage renal disease and is scheduled for renal replacement therapy today  Last 2D echocardiogram shows an LVEF of 55 to 60% from 11/20 Optimize blood pressure  control Continue carvedilol and Avapro    Community-acquired pneumonia Patient with cough, increased yellowish secretions through her tracheostomy as well as shortness of breath She has leukocytosis with WBC of  12K We will treat patient empirically with Rocephin and Zithromax    Diabetes mellitus with complications of end-stage renal disease on hemodialysis Dialysis days are M/W/F We will request nephrology consult for renal replacement therapy Maintain consistent carbohydrate diet Sliding scale insulin for glycemic control    Paroxysmal atrial fibrillation Rate controlled Continue Eliquis as primary prophylaxis for an acute stroke    Depression Continue Lexapro and trazodone         DVT prophylaxis: Eliquis Code Status: Full code Family Communication: Greater than 50% of time was spent discussing plan of care with patient and her son at the bedside.  All questions and concerns have been addressed.  They verbalized understanding and agree with the plan.  CODE STATUS was discussed and she is a full code Disposition Plan: Back to previous home environment Consults called: Nephrology     Collier Bullock MD [facility]     11/01/2020, 12:31 PM

## 2020-11-01 NOTE — ED Notes (Signed)
Report given to [facility] . Pt going to room 236. Pt currently in dialysis

## 2020-11-01 NOTE — ED Notes (Signed)
Pt taken to dialysis via transporter

## 2020-11-01 NOTE — ED Notes (Signed)
Spoke with lab to draw labs

## 2020-11-01 NOTE — ED Notes (Signed)
Lab at bedside

## 2020-11-01 NOTE — Progress Notes (Signed)
Pt arrived to unit from dialysis. In no acute distress. Fistula site is clean, and dry. Belongings put away. RT at bedside. VSS. Will continue to monitor.

## 2020-11-02 DIAGNOSIS — J9601 Acute respiratory failure with hypoxia: Secondary | ICD-10-CM

## 2020-11-02 LAB — CBC
HCT: 27.5 % — ABNORMAL LOW (ref 36.0–46.0)
Hemoglobin: 9.5 g/dL — ABNORMAL LOW (ref 12.0–15.0)
MCH: 28.5 pg (ref 26.0–34.0)
MCHC: 34.5 g/dL (ref 30.0–36.0)
MCV: 82.6 fL (ref 80.0–100.0)
Platelets: 177 10*3/uL (ref 150–400)
RBC: 3.33 MIL/uL — ABNORMAL LOW (ref 3.87–5.11)
RDW: 16.6 % — ABNORMAL HIGH (ref 11.5–15.5)
WBC: 8.9 10*3/uL (ref 4.0–10.5)
nRBC: 0 % (ref 0.0–0.2)

## 2020-11-02 LAB — BASIC METABOLIC PANEL
Anion gap: 10 (ref 5–15)
BUN: 28 mg/dL — ABNORMAL HIGH (ref 8–23)
CO2: 29 mmol/L (ref 22–32)
Calcium: 8.8 mg/dL — ABNORMAL LOW (ref 8.9–10.3)
Chloride: 99 mmol/L (ref 98–111)
Creatinine, Ser: 4.77 mg/dL — ABNORMAL HIGH (ref 0.44–1.00)
GFR, Estimated: 10 mL/min — ABNORMAL LOW (ref >60)
Glucose, Bld: 99 mg/dL (ref 70–99)
Potassium: 3.5 mmol/L (ref 3.5–5.1)
Sodium: 138 mmol/L (ref 135–145)

## 2020-11-02 LAB — GLUCOSE, CAPILLARY
Glucose-Capillary: 122 mg/dL — ABNORMAL HIGH (ref 70–99)
Glucose-Capillary: 163 mg/dL — ABNORMAL HIGH (ref 70–99)
Glucose-Capillary: 133 mg/dL — ABNORMAL HIGH (ref 70–99)
Glucose-Capillary: 137 mg/dL — ABNORMAL HIGH (ref 70–99)

## 2020-11-02 LAB — PROCALCITONIN: Procalcitonin: 0.23 ng/mL

## 2020-11-02 LAB — MRSA PCR SCREENING: MRSA by PCR: NEGATIVE

## 2020-11-02 NOTE — Progress Notes (Signed)
PROGRESS NOTE   SHAUNTEL PREST  OIZ:[id number] DOB: 04-09-1956 DOA: 11/01/2020 PCP: Leta Speller, MD  Brief Narrative:  46 black female HTN HFpEF MV replacement P A. 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  Layering fluid atelectasis?  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 Viabahn stent placed by vascular Admission 1029--10/03/2017 sepsis secondary to HCAP discharged on [place] Admission 09/08/2017-09/10/2017 bilateral pneumonia hypoxic failure Admission 11/8-11/29/2017 from inpatient rehab Admission 11/30-12/23/2017-developed cough fever chills MSSA bacteremia from HD catheter placed on 6 weeks of Ancef rifampin required at that hospital stay requiring repeat tracheostomy 12/7 for vocal cord paralysis that admission had placement of PEG tube Admission 9/15--10/11/2016 inferoposterior STEMI-PTCA X stent X2-developed respiratory failure requiring intubation shock and AKI-needing intra-aortic balloon pump-persistently acidotic eventually needing dialysis---felt to have ischemic mitral regurgitation-underwent mitral valve repair 3/41/9379--[id number] course complicated by Enterobacter bacteremia requiring pressors developed atrial flutter status post TEE DCCV  Assessment & Plan:   Principal Problem:    Acute hypoxemic respiratory failure ([name]) Active Problems:   Hypertension   Diabetes mellitus type 2, controlled ([name])   Acute on chronic diastolic heart failure ([name])   ESRD (end stage renal disease) ([name])   Atrial flutter ([name])   Acute diastolic CHF (congestive heart failure) ([place])   Tracheostomy status ([place])   CAP (community acquired pneumonia)   1. Sepsis secondary to pneumonia 2. Possible dysphagia a. Continue ceftriaxone azithromycin- b. repeat CXR in a.m. postdialysis to see if lungs clear as per my over read x-rays from April are not significantly different than admission x-ray c. Cycle procalcitonin to de-escalate antibiotics as able d. Reports some difficulty at times with swallowing more recently-speech therapy input given prior vocal cord paralysis 3. ESRD MWF since 2017 a. Defer to nephrology plan of care-continue Renvela 1.6 3 times daily, midodrine 5 mg for blood pressure support during the HD etc. 4. Tracheotomy status with size 6 trach-not a candidate for decannulation a. At baseline is on room air at home b. RT to evaluate and manage-trial wean to room air 5. STEMI 08/18/2016 + mitral valve repair 08/23/2016 a. Continue aspirin 81, Ava pro 150, Coreg 25 twice daily Lipitor 80 6. Atrial flutter chronic on Eliquis status post DCCV 2017 a. Coreg as above b. Keep on monitors for now 7. HFrEF-->improvement ->HFpEF last echo 55-60 LVH + moderate pulmonary HTN a. Volume managed with combination of Lasix 40 mg and dialysis 8. 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 CBG ranges 99-1 22 b. At home is typically on Levemir 13 units at bedtime which will be resumed slowly as indicated c. Continue gabapentin 300 at bedtime 9. Depression a. Continue Lexapro 10, trazodone 50 10. Prior MSSA bacteremia  DVT prophylaxis: Heparin Code Status: Full Family Communication:  Disposition:  Status is: Inpatient  Remains inpatient appropriate because:Hemodynamically  unstable, Persistent severe electrolyte disturbances and Ongoing active pain requiring inpatient pain management   Dispo: The patient is from: Home  Anticipated d/c is to: To be determined              Anticipated d/c date is: 3 days              Patient currently is not medically stable to d/c.   Consultants:   Nephrology  Procedures: Multiple  Antimicrobials: Azithromycin ceftriaxone   Subjective: Awake pleasant no distress tolerating diet-able to verbalize despite ostomy by occluding the area tells me Passy-Muir does not work No chest pain Reports increasing yellow sputum over the past several days and a cold recently No sick contacts Feels a little better  Objective: Vitals:   11/01/20 2050 11/01/20 2154 11/01/20 2200 11/02/20 0445  BP: (!) 150/68 (!) 172/80  (!) 159/70  Pulse: 80 81  71  Resp: 14 16  16   Temp:  99.3 F (37.4 C)  98.5 F (36.9 C)  TempSrc:  Oral  Oral  SpO2: 98% 95% 96% 93%  Weight:      Height:        Intake/Output Summary (Last 24 hours) at 11/02/2020 3710 Last data filed at 11/02/2020 0319 Gross per 24 hour  Intake 340.47 ml  Output 0 ml  Net 340.47 ml   Filed Weights   11/01/20 0911  Weight: 62.1 kg    Examination: Awake coherent no distress Tracheotomy in place with oxygen on top S1-S2 no murmur no rub no gallop-on monitors normal sinus rhythm RRR to my exam Decreased air entry right posterior lung fields compared to left side No lower extremity edema no overall swelling   Data Reviewed: I have personally reviewed following labs and imaging studies BUNs/creatinine 28/4.77 WBC 8.9 Hemoglobin 10.8-->9.5  Radiology Studies: DG Chest 2 View  Result Date: 11/01/2020 CLINICAL DATA:  Shortness of breath. EXAM: CHEST - 2 VIEW COMPARISON:  11/01/2028 FINDINGS: Sternotomy wires, prosthetic heart valve and tracheostomy tube unchanged. Lungs are adequately inflated and demonstrate hazy opacification over the right base  which may be due to combination layering pleural fluid and atelectasis although infection is possible. There is mild hazy prominence of the central pulmonary vessels likely mild degree of vascular congestion. Stable cardiomegaly. Remainder of the exam is unchanged. IMPRESSION: 1. Hazy opacification over the right base which may be due to combination of layering pleural fluid and atelectasis, although infection is possible. 2. Cardiomegaly with suggestion of mild vascular congestion. Electronically Signed   By: Marin Olp M.D.   On: 11/01/2020 09:44     Scheduled Meds: . apixaban  2.5 mg Oral BID  . aspirin EC  81 mg Oral Daily  . atorvastatin  80 mg Oral q1800  . carvedilol  25 mg Oral BID  . Chlorhexidine Gluconate Cloth  6 each Topical Q0600  . escitalopram  10 mg Oral Daily  . furosemide  40 mg Oral Once per day on Sun Tue Thu Sat  . gabapentin  300 mg Oral QHS  . insulin aspart  0-9 Units Subcutaneous TID WC  . irbesartan  150 mg Oral Daily  . midodrine  5 mg Oral Once per day on Mon Wed Fri  . multivitamin  1 tablet Oral Daily  . pantoprazole  40 mg Oral BID  . sevelamer carbonate  1,600 mg Oral TID WC  . sodium chloride flush  3 mL Intravenous Q12H  . traZODone  50 mg Oral Once per day on Mon Wed Fri   Continuous Infusions: . sodium chloride    . azithromycin    . cefTRIAXone (ROCEPHIN)  IV 2 g (11/01/20 2355)     LOS: 1 day    Time spent: [place], MD [facility] To contact the attending provider between 7A-7P or the covering provider during after hours 7P-7A, please log into the web site [email or web address] and access using universal [place] password for that web site. If you do not have the password, please call the hospital operator.  11/02/2020, 7:26 AM

## 2020-11-02 NOTE — Plan of Care (Signed)

## 2020-11-02 NOTE — Progress Notes (Signed)
[facility], [place] 11/02/20  Subjective:   LOS: 1   Patient sitting up in bed, in no acute distress, continues to require supplemental O2 via tracheostomy collar.  She reports feeling better today   Objective:  Vital signs in last 24 hours:  Temp:  [98.1 F (36.7 C)-99.3 F (37.4 C)] 98.3 F (36.8 C) (11/30 1137) Pulse Rate:  [68-82] 75 (11/30 1137) Resp:  [12-17] 17 (11/30 1137) BP: (130-172)/(64-101) 159/64 (11/30 1137) SpO2:  [88 %-99 %] 94 % (11/30 1137) FiO2 (%):  [35 %] 35 % (11/30 1439)  Weight change:  Filed Weights   11/01/20 0911  Weight: 62.1 kg    Intake/Output:    Intake/Output Summary (Last 24 hours) at 11/02/2020 1527 Last data filed at 11/02/2020 1345 Gross per 24 hour  Intake 820.47 ml  Output 400 ml  Net 420.47 ml    Physical Exam: General:  Resting in bed, in no acute distress  HEENT  sclera and conjunctiva clear, oral mucous membranes moist  Pulm/lungs  lungs with rhonchi bilaterally at the bases, respirations slightly labored  CVS/Heart  S1-S2 no rubs or gallops  Abdomen:   Soft, nontender, nondistended  Extremities:  No peripheral edema  Neurologic:  Awake, alert, oriented   Skin:  No acute rashes  Left arm AVG  Basic Metabolic Panel:  Recent Labs  Lab 11/01/20 0912 11/02/20 0355  NA 138 138  K 4.8 3.5  CL 98 99  CO2 26 29  GLUCOSE 155* 99  BUN 72* 28*  CREATININE 8.66* 4.77*  CALCIUM 9.6 8.8*  PHOS 3.4  --      CBC: Recent Labs  Lab 10/28/20 1328 11/01/20 0912 11/02/20 0355  WBC 7.6 12.3* 8.9  HGB 11.4* 10.8* 9.5*  HCT 34.4* 32.6* 27.5*  MCV 84.3 84.7 82.6  PLT 175 209 177      Lab Results  Component Value Date   HEPBSAG NON REACTIVE 06/26/2020   HEPBSAB Non Reactive 08/17/2016   HEPBIGM Negative 08/26/2016      Microbiology:  Recent Results (from the past 240 hour(s))  Resp Panel by RT-PCR (Flu A&B, Covid) Nasopharyngeal Swab     Status: None   Collection Time:  11/01/20  9:22 AM   Specimen: Nasopharyngeal Swab; Nasopharyngeal(NP) swabs in vial transport medium  Result Value Ref Range Status   SARS Coronavirus 2 by RT PCR NEGATIVE NEGATIVE Final    Comment: (NOTE) SARS-CoV-2 target nucleic acids are NOT DETECTED.  The SARS-CoV-2 RNA is generally detectable in upper respiratory specimens during the acute phase of infection. The lowest concentration of SARS-CoV-2 viral copies this assay can detect is 138 copies/mL. A negative result does not preclude SARS-Cov-2 infection and should not be used as the sole basis for treatment or other patient management decisions. A negative result may occur with  improper specimen collection/handling, submission of specimen other than nasopharyngeal swab, presence of viral mutation(s) within the areas targeted by this assay, and inadequate number of viral copies(<138 copies/mL). A negative result must be combined with clinical observations, patient history, and epidemiological information. The expected result is Negative.  Fact Sheet for Patients:  EntrepreneurPulse.com.au  Fact Sheet for Healthcare Providers:  IncredibleEmployment.be  This test is no t yet approved or cleared by the [place] FDA and  has been authorized for detection and/or diagnosis of SARS-CoV-2 by FDA under an Emergency Use Authorization (EUA). This EUA will remain  in effect (meaning this test can be used) for the duration of  the COVID-19 declaration under Section 564(b)(1) of the Act, 21 U.S.C.section 360bbb-3(b)(1), unless the authorization is terminated  or revoked sooner.       Influenza A by PCR NEGATIVE NEGATIVE Final   Influenza B by PCR NEGATIVE NEGATIVE Final    Comment: (NOTE) The Xpert Xpress SARS-CoV-2/FLU/RSV plus assay is intended as an aid in the diagnosis of influenza from Nasopharyngeal swab specimens and should not be used as a sole basis for treatment. Nasal washings  and aspirates are unacceptable for Xpert Xpress SARS-CoV-2/FLU/RSV testing.  Fact Sheet for Patients: EntrepreneurPulse.com.au  Fact Sheet for Healthcare Providers: IncredibleEmployment.be  This test is not yet approved or cleared by the [place] FDA and has been authorized for detection and/or diagnosis of SARS-CoV-2 by FDA under an Emergency Use Authorization (EUA). This EUA will remain in effect (meaning this test can be used) for the duration of the COVID-19 declaration under Section 564(b)(1) of the Act, 21 U.S.C. section 360bbb-3(b)(1), unless the authorization is terminated or revoked.  Performed at [facility], [address] [place], [place] [address]   MRSA PCR Screening     Status: None   Collection Time: 11/02/20 12:00 AM   Specimen: Nasopharyngeal  Result Value Ref Range Status   MRSA by PCR NEGATIVE NEGATIVE Final    Comment:        The GeneXpert MRSA Assay (FDA approved for NASAL specimens only), is one component of a comprehensive MRSA colonization surveillance program. It is not intended to diagnose MRSA infection nor to guide or monitor treatment for MRSA infections. Performed at [facility], [address] [place], [place] [address]     Coagulation Studies: No results for input(s): LABPROT, INR in the last 72 hours.  Urinalysis: No results for input(s): COLORURINE, LABSPEC, PHURINE, GLUCOSEU, HGBUR, BILIRUBINUR, KETONESUR, PROTEINUR, UROBILINOGEN, NITRITE, LEUKOCYTESUR in the last 72 hours.  Invalid input(s): APPERANCEUR    Imaging: DG Chest 2 View  Result Date: 11/01/2020 CLINICAL DATA:  Shortness of breath. EXAM: CHEST - 2 VIEW COMPARISON:  11/01/2028 FINDINGS: Sternotomy wires, prosthetic heart valve and tracheostomy tube unchanged. Lungs are adequately inflated and demonstrate hazy opacification over the right base which may be due to combination layering pleural fluid and  atelectasis although infection is possible. There is mild hazy prominence of the central pulmonary vessels likely mild degree of vascular congestion. Stable cardiomegaly. Remainder of the exam is unchanged. IMPRESSION: 1. Hazy opacification over the right base which may be due to combination of layering pleural fluid and atelectasis, although infection is possible. 2. Cardiomegaly with suggestion of mild vascular congestion. Electronically Signed   By: [place] Olp M.D.   On: 11/01/2020 09:44     Medications:   . sodium chloride    . azithromycin 500 mg (11/02/20 1359)  . cefTRIAXone (ROCEPHIN)  IV 2 g (11/01/20 2355)   . apixaban  2.5 mg Oral BID  . aspirin EC  81 mg Oral Daily  . atorvastatin  80 mg Oral q1800  . carvedilol  25 mg Oral BID  . Chlorhexidine Gluconate Cloth  6 each Topical Q0600  . escitalopram  10 mg Oral Daily  . furosemide  40 mg Oral Once per day on Sun Tue Thu Sat  . gabapentin  300 mg Oral QHS  . insulin aspart  0-9 Units Subcutaneous TID WC  . irbesartan  150 mg Oral Daily  . midodrine  5 mg Oral Once per day on Mon Wed Fri  . multivitamin  1 tablet  Oral Daily  . pantoprazole  40 mg Oral BID  . sevelamer carbonate  1,600 mg Oral TID WC  . sodium chloride flush  3 mL Intravenous Q12H  . traZODone  50 mg Oral Once per day on Mon Wed Fri   sodium chloride, acetaminophen, albuterol, sodium chloride flush  Assessment/ Plan:  64 y.o. female with  was admitted on 11/01/2020 for  Principal Problem:   Acute hypoxemic respiratory failure ([name]) Active Problems:   Hypertension   Diabetes mellitus type 2, controlled ([place])   Acute on chronic diastolic heart failure ([name])   ESRD (end stage renal disease) ([name])   Atrial flutter ([name])   Acute diastolic CHF (congestive heart failure) ([place])   Tracheostomy status ([place])   CAP (community acquired pneumonia)  Acute respiratory failure with hypoxia ([place]) [M62.94] Acute diastolic CHF (congestive heart failure) ([place])  [I50.31] Community acquired pneumonia, unspecified laterality [J18.9]  #. ESRD MWF Schedule Patient received dialysis treatment yesterday Volume and electrolyte status acceptable No acute indication for dialysis today We will continue Monday Wednesday Friday schedule  #. Anemia of CKD  Lab Results  Component Value Date   HGB 9.5 (L) 11/02/2020  We will continue monitoring CBCs  #. Secondary hyperparathyroidism of renal origin N 25.81      Component Value Date/Time   PTH 270 (H) 10/03/2017 0949   Lab Results  Component Value Date   PHOS 3.4 11/01/2020  Patient is on Sevelamer   #. Diabetes type 2 with CKD Hgb A1c MFr Bld (%)  Date Value  11/01/2020 7.2 (H)  On Insulin Aspart sliding scale  # Pneumonia Receiving Ceftriaxone and Azithromycin Management per primary team   LOS: 1 Kahlea Cobert 11/30/20213:27 PM  [place], [place]

## 2020-11-02 NOTE — Progress Notes (Signed)
RT called to room to assess patient due to her not feeling like the ATC was working correctly. Patient states that she did not feel like there was any flow coming from device. Device assessed and noted to be working correctly. Patient stated she did feel somewhat SOB, patient gave PRN albuterol neb and inner cannula. Patient states she feels better after neb completed.

## 2020-11-03 ENCOUNTER — Inpatient Hospital Stay: Payer: Medicare Other

## 2020-11-03 DIAGNOSIS — J9601 Acute respiratory failure with hypoxia: Secondary | ICD-10-CM | POA: Diagnosis not present

## 2020-11-03 LAB — CBC WITH DIFFERENTIAL/PLATELET
Abs Immature Granulocytes: 0.02 10*3/uL (ref 0.00–0.07)
Basophils Absolute: 0 10*3/uL (ref 0.0–0.1)
Basophils Relative: 0 %
Eosinophils Absolute: 0.6 10*3/uL — ABNORMAL HIGH (ref 0.0–0.5)
Eosinophils Relative: 6 %
HCT: 29 % — ABNORMAL LOW (ref 36.0–46.0)
Hemoglobin: 9.5 g/dL — ABNORMAL LOW (ref 12.0–15.0)
Immature Granulocytes: 0 %
Lymphocytes Relative: 13 %
Lymphs Abs: 1.2 10*3/uL (ref 0.7–4.0)
MCH: 27.8 pg (ref 26.0–34.0)
MCHC: 32.8 g/dL (ref 30.0–36.0)
MCV: 84.8 fL (ref 80.0–100.0)
Monocytes Absolute: 1 10*3/uL (ref 0.1–1.0)
Monocytes Relative: 11 %
Neutro Abs: 6.2 10*3/uL (ref 1.7–7.7)
Neutrophils Relative %: 70 %
Platelets: 176 10*3/uL (ref 150–400)
RBC: 3.42 MIL/uL — ABNORMAL LOW (ref 3.87–5.11)
RDW: 16.8 % — ABNORMAL HIGH (ref 11.5–15.5)
WBC: 9.1 10*3/uL (ref 4.0–10.5)
nRBC: 0 % (ref 0.0–0.2)

## 2020-11-03 LAB — COMPREHENSIVE METABOLIC PANEL
ALT: 25 U/L (ref 0–44)
AST: 18 U/L (ref 15–41)
Albumin: 3.3 g/dL — ABNORMAL LOW (ref 3.5–5.0)
Alkaline Phosphatase: 61 U/L (ref 38–126)
Anion gap: 11 (ref 5–15)
BUN: 56 mg/dL — ABNORMAL HIGH (ref 8–23)
CO2: 27 mmol/L (ref 22–32)
Calcium: 8.8 mg/dL — ABNORMAL LOW (ref 8.9–10.3)
Chloride: 101 mmol/L (ref 98–111)
Creatinine, Ser: 6.7 mg/dL — ABNORMAL HIGH (ref 0.44–1.00)
GFR, Estimated: 6 mL/min — ABNORMAL LOW (ref >60)
Glucose, Bld: 157 mg/dL — ABNORMAL HIGH (ref 70–99)
Potassium: 4.2 mmol/L (ref 3.5–5.1)
Sodium: 139 mmol/L (ref 135–145)
Total Bilirubin: 1.1 mg/dL (ref 0.3–1.2)
Total Protein: 6.5 g/dL (ref 6.5–8.1)

## 2020-11-03 LAB — GLUCOSE, CAPILLARY
Glucose-Capillary: 157 mg/dL — ABNORMAL HIGH (ref 70–99)
Glucose-Capillary: 103 mg/dL — ABNORMAL HIGH (ref 70–99)
Glucose-Capillary: 228 mg/dL — ABNORMAL HIGH (ref 70–99)

## 2020-11-03 LAB — PROCALCITONIN: Procalcitonin: 0.37 ng/mL

## 2020-11-03 MED ORDER — AZITHROMYCIN 250 MG PO TABS
500.0000 mg | ORAL_TABLET | Freq: Every day | ORAL | Status: AC
  Administered 2020-11-03 – 2020-11-05 (×3): 500 mg via ORAL
  Filled 2020-11-03 (×3): qty 2

## 2020-11-03 MED ORDER — INSULIN DETEMIR 100 UNIT/ML ~~LOC~~ SOLN
10.0000 [IU] | Freq: Every day | SUBCUTANEOUS | Status: DC
  Administered 2020-11-03 – 2020-11-08 (×6): 10 [IU] via SUBCUTANEOUS
  Filled 2020-11-03 (×7): qty 0.1

## 2020-11-03 NOTE — Progress Notes (Signed)
Patient returned to the floor from  dialysis ,  2 Kilos removed as per dialysis Nurse, patient tolerated , remains on track collar 35%, no  respiratory distress noted

## 2020-11-03 NOTE — Progress Notes (Signed)
[facility], [place] 11/03/20  Subjective:   LOS: 2   Patient resting in bed, in no acute distress, still has complaints of shortness of breath with minimal exertion. Dialysis today.   HEMODIALYSIS FLOWSHEET:  Blood Flow Rate (mL/min): 350 mL/min Arterial Pressure (mmHg): -160 mmHg Venous Pressure (mmHg): 170 mmHg Transmembrane Pressure (mmHg): 90 mmHg Ultrafiltration Rate (mL/min): 720 mL/min Dialysate Flow Rate (mL/min): 800 ml/min Conductivity: Machine : 13.9 Conductivity: Machine : 13.9 Dialysis Fluid Bolus: Normal Saline Bolus Amount (mL): 300 mL     Objective:  Vital signs in last 24 hours:  Temp:  [98.2 F (36.8 C)-99.2 F (37.3 C)] 99.2 F (37.3 C) (12/01 1100) Pulse Rate:  [70-78] 72 (12/01 1345) Resp:  [16-22] 19 (12/01 1345) BP: (140-179)/(67-85) 157/78 (12/01 1345) SpO2:  [95 %-98 %] 98 % (12/01 0747) FiO2 (%):  [35 %] 35 % (12/01 0335) Weight:  [66.3 kg] 66.3 kg (12/01 0605)  Weight change: 4.171 kg Filed Weights   11/01/20 0911 11/03/20 0605  Weight: 62.1 kg 66.3 kg    Intake/Output:    Intake/Output Summary (Last 24 hours) at 11/03/2020 1410 Last data filed at 11/03/2020 1024 Gross per 24 hour  Intake 480 ml  Output 675 ml  Net -195 ml    Physical Exam: General:  In no acute distress  HEENT  normocephalic, atraumatic  Pulm/lungs  bilateral rhonchi +, supplemental O2 9 L via trach collar, dyspnea on exertion  CVS/Heart  regular  Abdomen:   Soft, nontender, nondistended  Extremities:  No peripheral edema  Neurologic:  Speech clear and appropriate   Skin:  No acute rashes  Left arm AVG  Basic Metabolic Panel:  Recent Labs  Lab 11/01/20 0912 11/02/20 0355 11/03/20 0403  NA 138 138 139  K 4.8 3.5 4.2  CL 98 99 101  CO2 26 29 27   GLUCOSE 155* 99 157*  BUN 72* 28* 56*  CREATININE 8.66* 4.77* 6.70*  CALCIUM 9.6 8.8* 8.8*  PHOS 3.4  --   --      CBC: Recent Labs  Lab 10/28/20 1328 11/01/20 0912  11/02/20 0355 11/03/20 0403  WBC 7.6 12.3* 8.9 9.1  NEUTROABS  --   --   --  6.2  HGB 11.4* 10.8* 9.5* 9.5*  HCT 34.4* 32.6* 27.5* 29.0*  MCV 84.3 84.7 82.6 84.8  PLT 175 209 177 176      Lab Results  Component Value Date   HEPBSAG NON REACTIVE 06/26/2020   HEPBSAB Non Reactive 08/17/2016   HEPBIGM Negative 08/26/2016      Microbiology:  Recent Results (from the past 240 hour(s))  Resp Panel by RT-PCR (Flu A&B, Covid) Nasopharyngeal Swab     Status: None   Collection Time: 11/01/20  9:22 AM   Specimen: Nasopharyngeal Swab; Nasopharyngeal(NP) swabs in vial transport medium  Result Value Ref Range Status   SARS Coronavirus 2 by RT PCR NEGATIVE NEGATIVE Final    Comment: (NOTE) SARS-CoV-2 target nucleic acids are NOT DETECTED.  The SARS-CoV-2 RNA is generally detectable in upper respiratory specimens during the acute phase of infection. The lowest concentration of SARS-CoV-2 viral copies this assay can detect is 138 copies/mL. A negative result does not preclude SARS-Cov-2 infection and should not be used as the sole basis for treatment or other patient management decisions. A negative result may occur with  improper specimen collection/handling, submission of specimen other than nasopharyngeal swab, presence of viral mutation(s) within the areas targeted by this assay, and inadequate  number of viral copies(<138 copies/mL). A negative result must be combined with clinical observations, patient history, and epidemiological information. The expected result is Negative.  Fact Sheet for Patients:  EntrepreneurPulse.com.au  Fact Sheet for Healthcare Providers:  IncredibleEmployment.be  This test is no t yet approved or cleared by the [place] FDA and  has been authorized for detection and/or diagnosis of SARS-CoV-2 by FDA under an Emergency Use Authorization (EUA). This EUA will remain  in effect (meaning this test can be used) for  the duration of the COVID-19 declaration under Section 564(b)(1) of the Act, 21 U.S.C.section 360bbb-3(b)(1), unless the authorization is terminated  or revoked sooner.       Influenza A by PCR NEGATIVE NEGATIVE Final   Influenza B by PCR NEGATIVE NEGATIVE Final    Comment: (NOTE) The Xpert Xpress SARS-CoV-2/FLU/RSV plus assay is intended as an aid in the diagnosis of influenza from Nasopharyngeal swab specimens and should not be used as a sole basis for treatment. Nasal washings and aspirates are unacceptable for Xpert Xpress SARS-CoV-2/FLU/RSV testing.  Fact Sheet for Patients: EntrepreneurPulse.com.au  Fact Sheet for Healthcare Providers: IncredibleEmployment.be  This test is not yet approved or cleared by the [place] FDA and has been authorized for detection and/or diagnosis of SARS-CoV-2 by FDA under an Emergency Use Authorization (EUA). This EUA will remain in effect (meaning this test can be used) for the duration of the COVID-19 declaration under Section 564(b)(1) of the Act, 21 U.S.C. section 360bbb-3(b)(1), unless the authorization is terminated or revoked.  Performed at [facility], [address] [place], [place] [address]   MRSA PCR Screening     Status: None   Collection Time: 11/02/20 12:00 AM   Specimen: Nasopharyngeal  Result Value Ref Range Status   MRSA by PCR NEGATIVE NEGATIVE Final    Comment:        The GeneXpert MRSA Assay (FDA approved for NASAL specimens only), is one component of a comprehensive MRSA colonization surveillance program. It is not intended to diagnose MRSA infection nor to guide or monitor treatment for MRSA infections. Performed at [facility], [address] [place], [place] [address]     Coagulation Studies: No results for input(s): LABPROT, INR in the last 72 hours.  Urinalysis: No results for input(s): COLORURINE, LABSPEC, PHURINE, GLUCOSEU, HGBUR,  BILIRUBINUR, KETONESUR, PROTEINUR, UROBILINOGEN, NITRITE, LEUKOCYTESUR in the last 72 hours.  Invalid input(s): APPERANCEUR    Imaging: DG Chest 2 View  Result Date: 11/03/2020 CLINICAL DATA:  Pneumonia. EXAM: CHEST - 2 VIEW COMPARISON:  November 01, 2020. FINDINGS: Stable cardiomegaly. Status post cardiac valve repair. Tracheostomy tube is in good position. No pneumothorax is noted. Stable right basilar subsegmental atelectasis is noted. Increased left basilar opacity is noted concerning for worsening atelectasis or pneumonia with small left pleural effusion. Bony thorax is unremarkable. IMPRESSION: Increased left basilar opacity is noted concerning for worsening atelectasis or pneumonia with small left pleural effusion. Electronically Signed   By: Marijo Conception M.D.   On: 11/03/2020 08:28     Medications:   . sodium chloride    . cefTRIAXone (ROCEPHIN)  IV 2 g (11/02/20 1814)   . apixaban  2.5 mg Oral BID  . aspirin EC  81 mg Oral Daily  . atorvastatin  80 mg Oral q1800  . azithromycin  500 mg Oral Daily  . carvedilol  25 mg Oral BID  . Chlorhexidine Gluconate Cloth  6 each Topical Q0600  . escitalopram  10 mg  Oral Daily  . furosemide  40 mg Oral Once per day on Sun Tue Thu Sat  . gabapentin  300 mg Oral QHS  . insulin aspart  0-9 Units Subcutaneous TID WC  . insulin detemir  10 Units Subcutaneous QHS  . irbesartan  150 mg Oral Daily  . midodrine  5 mg Oral Once per day on Mon Wed Fri  . multivitamin  1 tablet Oral Daily  . pantoprazole  40 mg Oral BID  . sevelamer carbonate  1,600 mg Oral TID WC  . sodium chloride flush  3 mL Intravenous Q12H  . traZODone  50 mg Oral Once per day on Mon Wed Fri   sodium chloride, acetaminophen, albuterol, sodium chloride flush  Assessment/ Plan:  64 y.o. female with  was admitted on 11/01/2020 for  Principal Problem:   Acute hypoxemic respiratory failure ([name]) Active Problems:   Hypertension   Diabetes mellitus type 2, controlled  ([place])   Acute on chronic diastolic heart failure ([name])   ESRD (end stage renal disease) ([name])   Atrial flutter ([name])   Acute diastolic CHF (congestive heart failure) ([place])   Tracheostomy status ([place])   CAP (community acquired pneumonia)  Acute respiratory failure with hypoxia ([place]) [D32.20] Acute diastolic CHF (congestive heart failure) ([place]) [I50.31] Community acquired pneumonia, unspecified laterality [J18.9]  #. ESRD MWF Schedule Patient is receiving dialysis treatment today We will continue Monday Wednesday Friday schedule  #. Anemia of CKD  Lab Results  Component Value Date   HGB 9.5 (L) 11/03/2020    #. Secondary hyperparathyroidism of renal origin N 25.81      Component Value Date/Time   PTH 270 (H) 10/03/2017 0949   Lab Results  Component Value Date   PHOS 3.4 11/01/2020  Continue Renvela   #. Diabetes type 2 with CKD Hgb A1c MFr Bld (%)  Date Value  11/01/2020 7.2 (H)  Blood glucose readings within acceptable range Management per primary care team  # Pneumonia Requiring 9 L of O2 via trach collar Respirations labored, with accessory muscle use Receiving Ceftriaxone and Azithromycin    LOS: 2 [place] Oyster 12/1/20212:10 PM  [place], [place]

## 2020-11-03 NOTE — Progress Notes (Signed)
Arterial pressure high needle readjusted. BFR reduced to 350. Patient without complaint of pain, or discomfort at insertion site, thrill and bruit present, treatment continued.

## 2020-11-03 NOTE — Plan of Care (Signed)

## 2020-11-03 NOTE — Progress Notes (Signed)
No s/s of aspiration at bedside. However, has trach (2017) and probable pneumonia.  ST will follow up for a few days through Nsg and consider MBSS concerns of silent aspiration. Rec continue with Carb controlled regular diet.Pt reports she tried a [employer] Valve in the recent past but it was not successful as she did not feel like she could breath well. She prefers finger occlusion for communication.

## 2020-11-03 NOTE — Progress Notes (Signed)
MEDICATION RELATED NOTE -   This patient is receiving Azithromycin 500 mg daily by the intravenous route. Based on criteria approved by the Pharmacy and Therapeutics Committee, and the Infectious Disease Division, the antibiotic is being converted to equivalent oral dose form.  These criteria include: . Patient being treated for a respiratory tract infection, urinary tract infection, cellulitis, or Clostridium Difficile Associated Diarrhea . The patient is not neutropenic and does not exhibit a GI malabsorption state . The patient is eating (either orally or per tube) and/or has been taking other orally administered medications for at least 24 hours. . The patient is improving clinically (physician assessment and a 24-hour Tmax of ? 100.5? F).  If you have questions about this conversion, please contact the pharmacy department. Thank you.  Dorothe Pea, PharmD, BCPS 11/03/2020,9:50 AM

## 2020-11-03 NOTE — Progress Notes (Signed)
Hemodialysis patient known at [facility] MWF 10:40. Patient normally has a Careers adviser present with patient during treatments to help assist with oxygen. Patient does not have any concerns about transportation. Please contact me with any dialysis placement concerns.  Elvera Bicker Dialysis Coordinator [phone]

## 2020-11-03 NOTE — Progress Notes (Addendum)
PROGRESS NOTE    Deborah [name]  BWL:[id number] DOB: [birth year] DOA: 11/01/2020 PCP: Leta Speller, MD   Brief Narrative:  43 black female HTN HFpEF MV replacement P A. 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  Layering fluid atelectasis?  Infection-EKG RVH  Administered sepsis secondary to pneumonia.  Started on ceftriaxone and Zithromax.  Subjective: Patient was seen during dialysis today.  No new complaint.  Denies any worsening shortness of breath, continue to require oxygen although weaning.  Assessment & Plan:   Principal Problem:   Acute hypoxemic respiratory failure ([name]) Active Problems:   Hypertension   Diabetes mellitus type 2, controlled ([name])   Acute on chronic diastolic heart failure ([name])   ESRD (end stage renal disease) ([name])   Atrial flutter ([name])   Acute diastolic CHF (congestive heart failure) ([place])   Tracheostomy status ([place])   CAP (community acquired pneumonia)  Sepsis secondary to pneumonia.POA.  Seems improving, although continue to require oxygen.  No oxygen use at baseline.  Procalcitonin at 0.37 but patient is also on dialysis.No significant concern for aspiration per speech evaluation. -Continue ceftriaxone and Zithromax. -Wean oxygen as tolerated.  Chronic tracheostomy.  Since 2017, not a candidate for decannulation.  At baseline he does not use any oxygen at home. -Try to wean oxygen. -Ambulate patient with pulse ox tomorrow once she is on room air at rest.  ESRD.(MWF). Continue with her scheduled dialysis per nephrology. -Continue home meds which include Renvela and midodrine.  CAD.  Patient has an history of STEMI and a mitral valve  repair in September 2017.  No chest pain. -Continue home dose of aspirin, Avapro, Coreg and Lipitor.  Chronic atrial flutter s/p DCCV 2017.  No acute concern. -Continue home dose of Eliquis and Coreg.  HFpEF.  History of HFrEF which has been improved now.  Recent echocardiogram with normal EF, LVH and moderate pulmonary hypertension. -Volume is being managed by dialysis.  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 CBG elevated in 150s. -Please start home Levemir at a low dose of 10 units. -Continue with SSI  History of depression.  No acute concern. -Continue home dose of Lexapro and trazodone.  Objective: Vitals:   11/03/20 0605 11/03/20 0747 11/03/20 1100 11/03/20 1115  BP:  (!) 171/67 (!) 155/70 (!) 158/74  Pulse:  78 77 71  Resp:  17    Temp:  98.4 F (36.9 C) 99.2 F (37.3 C)   TempSrc:  Oral Oral   SpO2:  98%    Weight: 66.3 kg     Height:        Intake/Output Summary (Last 24 hours) at 11/03/2020 1301 Last data filed at 11/03/2020 1024 Gross per 24 hour  Intake 720 ml  Output 675 ml  Net 45 ml   Filed Weights   11/01/20 0911 11/03/20 0605  Weight: 62.1 kg 66.3 kg    Examination:  General exam: Appears calm and comfortable, getting dialysis.  Trach collar with no obvious secretion. Respiratory system: Clear to auscultation. Respiratory effort normal. Cardiovascular system: S1 & S2 heard, RRR.  Gastrointestinal system: Soft, nontender, nondistended, bowel sounds positive. Central nervous system: Alert and oriented. No focal neurological deficits.Symmetric 5 x 5  power. Extremities: No edema, no cyanosis, pulses intact and symmetrical. Psychiatry: Judgement and insight appear normal. Mood & affect appropriate.    DVT prophylaxis: Heparin Code Status: Full Family Communication: Son was updated on phone Disposition Plan:  Status is: Inpatient  Remains inpatient appropriate because:Inpatient level of care appropriate due to severity  of illness   Dispo: The patient is from: Home              Anticipated d/c is to: Home              Anticipated d/c date is: 1 day              Patient currently is not medically stable to d/c.   Consultants:   Nephrology  Procedures:  Antimicrobials:  Ceftriaxone Zithromax  Data Reviewed: I have personally reviewed following labs and imaging studies  CBC: Recent Labs  Lab 10/28/20 1328 11/01/20 0912 11/02/20 0355 11/03/20 0403  WBC 7.6 12.3* 8.9 9.1  NEUTROABS  --   --   --  6.2  HGB 11.4* 10.8* 9.5* 9.5*  HCT 34.4* 32.6* 27.5* 29.0*  MCV 84.3 84.7 82.6 84.8  PLT 175 209 177 235   Basic Metabolic Panel: Recent Labs  Lab 11/01/20 0912 11/02/20 0355 11/03/20 0403  NA 138 138 139  K 4.8 3.5 4.2  CL 98 99 101  CO2 26 29 27   GLUCOSE 155* 99 157*  BUN 72* 28* 56*  CREATININE 8.66* 4.77* 6.70*  CALCIUM 9.6 8.8* 8.8*  PHOS 3.4  --   --    GFR: Estimated Creatinine Clearance: 7.7 mL/min (A) (by C-G formula based on SCr of 6.7 mg/dL (H)). Liver Function Tests: Recent Labs  Lab 11/03/20 0403  AST 18  ALT 25  ALKPHOS 61  BILITOT 1.1  PROT 6.5  ALBUMIN 3.3*   No results for input(s): LIPASE, AMYLASE in the last 168 hours. No results for input(s): AMMONIA in the last 168 hours. Coagulation Profile: No results for input(s): INR, PROTIME in the last 168 hours. Cardiac Enzymes: No results for input(s): CKTOTAL, CKMB, CKMBINDEX, TROPONINI in the last 168 hours. BNP (last 3 results) No results for input(s): PROBNP in the last 8760 hours. HbA1C: Recent Labs    11/01/20 1244  HGBA1C 7.2*   CBG: Recent Labs  Lab 11/02/20 0740 11/02/20 1136 11/02/20 1609 11/02/20 2032 11/03/20 0749  GLUCAP 122* 163* 133* 137* 157*   Lipid Profile: No results for input(s): CHOL, HDL, LDLCALC, TRIG, CHOLHDL, LDLDIRECT in the last 72 hours. Thyroid Function Tests: No results for input(s): TSH, T4TOTAL, FREET4, T3FREE, THYROIDAB in the last 72 hours. Anemia Panel: No  results for input(s): VITAMINB12, FOLATE, FERRITIN, TIBC, IRON, RETICCTPCT in the last 72 hours. Sepsis Labs: Recent Labs  Lab 11/02/20 0355 11/03/20 0403  PROCALCITON 0.23 0.37    Recent Results (from the past 240 hour(s))  Resp Panel by RT-PCR (Flu A&B, Covid) Nasopharyngeal Swab     Status: None   Collection Time: 11/01/20  9:22 AM   Specimen: Nasopharyngeal Swab; Nasopharyngeal(NP) swabs in vial transport medium  Result Value Ref Range Status   SARS Coronavirus 2 by RT PCR NEGATIVE NEGATIVE Final    Comment: (NOTE) SARS-CoV-2 target nucleic acids are NOT DETECTED.  The SARS-CoV-2 RNA is generally detectable in upper respiratory specimens during the acute phase of infection. The lowest concentration of SARS-CoV-2 viral copies this assay can detect is 138 copies/mL. A negative result does not preclude SARS-Cov-2 infection and should not be used  as the sole basis for treatment or other patient management decisions. A negative result may occur with  improper specimen collection/handling, submission of specimen other than nasopharyngeal swab, presence of viral mutation(s) within the areas targeted by this assay, and inadequate number of viral copies(<138 copies/mL). A negative result must be combined with clinical observations, patient history, and epidemiological information. The expected result is Negative.  Fact Sheet for Patients:  EntrepreneurPulse.com.au  Fact Sheet for Healthcare Providers:  IncredibleEmployment.be  This test is no t yet approved or cleared by the [place] FDA and  has been authorized for detection and/or diagnosis of SARS-CoV-2 by FDA under an Emergency Use Authorization (EUA). This EUA will remain  in effect (meaning this test can be used) for the duration of the COVID-19 declaration under Section 564(b)(1) of the Act, 21 U.S.C.section 360bbb-3(b)(1), unless the authorization is terminated  or revoked sooner.         Influenza A by PCR NEGATIVE NEGATIVE Final   Influenza B by PCR NEGATIVE NEGATIVE Final    Comment: (NOTE) The Xpert Xpress SARS-CoV-2/FLU/RSV plus assay is intended as an aid in the diagnosis of influenza from Nasopharyngeal swab specimens and should not be used as a sole basis for treatment. Nasal washings and aspirates are unacceptable for Xpert Xpress SARS-CoV-2/FLU/RSV testing.  Fact Sheet for Patients: EntrepreneurPulse.com.au  Fact Sheet for Healthcare Providers: IncredibleEmployment.be  This test is not yet approved or cleared by the [place] FDA and has been authorized for detection and/or diagnosis of SARS-CoV-2 by FDA under an Emergency Use Authorization (EUA). This EUA will remain in effect (meaning this test can be used) for the duration of the COVID-19 declaration under Section 564(b)(1) of the Act, 21 U.S.C. section 360bbb-3(b)(1), unless the authorization is terminated or revoked.  Performed at [facility], [address] [place], [place] [address]   MRSA PCR Screening     Status: None   Collection Time: 11/02/20 12:00 AM   Specimen: Nasopharyngeal  Result Value Ref Range Status   MRSA by PCR NEGATIVE NEGATIVE Final    Comment:        The GeneXpert MRSA Assay (FDA approved for NASAL specimens only), is one component of a comprehensive MRSA colonization surveillance program. It is not intended to diagnose MRSA infection nor to guide or monitor treatment for MRSA infections. Performed at [facility], [address] [place], [place] [address]      Radiology Studies: DG Chest 2 View  Result Date: 11/03/2020 CLINICAL DATA:  Pneumonia. EXAM: CHEST - 2 VIEW COMPARISON:  November 01, 2020. FINDINGS: Stable cardiomegaly. Status post cardiac valve repair. Tracheostomy tube is in good position. No pneumothorax is noted. Stable right basilar subsegmental atelectasis is noted. Increased left  basilar opacity is noted concerning for worsening atelectasis or pneumonia with small left pleural effusion. Bony thorax is unremarkable. IMPRESSION: Increased left basilar opacity is noted concerning for worsening atelectasis or pneumonia with small left pleural effusion. Electronically Signed   By: Marijo Conception M.D.   On: 11/03/2020 08:28    Scheduled Meds: . apixaban  2.5 mg Oral BID  . aspirin EC  81 mg Oral Daily  . atorvastatin  80 mg Oral q1800  . azithromycin  500 mg Oral Daily  . carvedilol  25 mg Oral BID  . Chlorhexidine Gluconate Cloth  6 each Topical Q0600  . escitalopram  10 mg Oral Daily  . furosemide  40 mg Oral Once per day on Sun Tue Thu Sat  .  gabapentin  300 mg Oral QHS  . insulin aspart  0-9 Units Subcutaneous TID WC  . irbesartan  150 mg Oral Daily  . midodrine  5 mg Oral Once per day on Mon Wed Fri  . multivitamin  1 tablet Oral Daily  . pantoprazole  40 mg Oral BID  . sevelamer carbonate  1,600 mg Oral TID WC  . sodium chloride flush  3 mL Intravenous Q12H  . traZODone  50 mg Oral Once per day on Mon Wed Fri   Continuous Infusions: . sodium chloride    . cefTRIAXone (ROCEPHIN)  IV 2 g (11/02/20 1814)     LOS: 2 days   Time spent: Minutes.  Lorella Nimrod, MD [facility]  If 7PM-7AM, please contact night-coverage [email or web address]  11/03/2020, 1:01 PM   This record has been created using Systems analyst. Errors have been sought and corrected,but may not always be located. Such creation errors do not reflect on the standard of care.

## 2020-11-03 NOTE — Evaluation (Signed)
Clinical/Bedside Swallow Evaluation Patient Details  Name: Deborah [name] MRN: [id number] Date of Birth: July 22, 1956  Today's Date: 11/03/2020 Time: SLP Start Time (ACUTE ONLY): 0950 SLP Stop Time (ACUTE ONLY): 1027 SLP Time Calculation (min) (ACUTE ONLY): 37 min  Past Medical History:  Past Medical History:  Diagnosis Date  . Diabetes mellitus   . Hyperlipidemia   . Hypertension   . MI, old   . Renal disorder    dialysls   Past Surgical History:  Past Surgical History:  Procedure Laterality Date  . A/V FISTULAGRAM N/A 10/05/2017   Procedure: A/V Fistulagram;  Surgeon: Katha Cabal, MD;  Location: [place] [facility];  Service: Cardiovascular;  Laterality: N/A;  . A/V SHUNTOGRAM N/A 10/05/2017   Procedure: A/V SHUNTOGRAM;  Surgeon: Katha Cabal, MD;  Location: [place] [facility];  Service: Cardiovascular;  Laterality: N/A;  . AV FISTULA PLACEMENT Left 10/02/2016   Procedure: INSERTION OF ARTERIOVENOUS (AV) GORE-TEX GRAFT ARM;  Surgeon: Waynetta Sandy, MD;  Location: [place];  Service: Vascular;  Laterality: Left;  . CARDIAC CATHETERIZATION N/A 08/15/2016   Procedure: Left Heart Cath and Coronary Angiography;  Surgeon: Yolonda Kida, MD;  Location: [place] [facility];  Service: Cardiovascular;  Laterality: N/A;  . CARDIAC CATHETERIZATION N/A 08/15/2016   Procedure: Coronary Stent Intervention;  Surgeon: Yolonda Kida, MD;  Location: [place] [facility];  Service: Cardiovascular;  Laterality: N/A;  . CARDIAC CATHETERIZATION N/A 08/18/2016   Procedure: Right Heart Cath;  Surgeon: Jolaine Artist, MD;  Location: [place] [facility];  Service: Cardiovascular;  Laterality: N/A;  . CARDIAC CATHETERIZATION N/A 08/18/2016   Procedure: IABP Insertion;  Surgeon: Jolaine Artist, MD;  Location: [place] [facility];  Service: Cardiovascular;  Laterality: N/A;  . CARDIAC CATHETERIZATION Right 08/23/2016   Procedure: CENTRAL LINE INSERTION RIGHT SUBCLAVIAN;  Surgeon:  Ivin Poot, MD;  Location: [place];  Service: Open Heart Surgery;  Laterality: Right;  . ENDOVEIN HARVEST OF GREATER SAPHENOUS VEIN Left 08/23/2016   Procedure: ENDOVEIN HARVEST OF GREATER SAPHENOUS VEIN;  Surgeon: Ivin Poot, MD;  Location: [place];  Service: Open Heart Surgery;  Laterality: Left;  . INSERTION OF DIALYSIS CATHETER Bilateral 08/31/2016   Procedure: INSERTION OF DIALYSIS CATHETER LEFT INTERNAL JUGULAR VEIN & INSERTION OF TRIPLE LUMEN RIGHT INTERNAL JUGULAR VEIN;  Surgeon: Angelia Mould, MD;  Location: [place];  Service: Vascular;  Laterality: Bilateral;  . INTRAOPERATIVE TRANSESOPHAGEAL ECHOCARDIOGRAM N/A 08/23/2016   Procedure: INTRAOPERATIVE TRANSESOPHAGEAL ECHOCARDIOGRAM;  Surgeon: Ivin Poot, MD;  Location: [place];  Service: Open Heart Surgery;  Laterality: N/A;  . IR GENERIC HISTORICAL  11/13/2016   IR US GUIDE VASC ACCESS LEFT 11/13/2016 Corrie Mckusick, DO [facility]  . IR GENERIC HISTORICAL  11/13/2016   IR FLUORO GUIDE CV LINE LEFT 11/13/2016 Corrie Mckusick, DO [facility]  . IR GENERIC HISTORICAL  11/13/2016   IR GASTROSTOMY TUBE MOD SED 11/13/2016 Corrie Mckusick, DO [facility]  . MITRAL VALVE REPAIR N/A 08/23/2016   Procedure: MITRAL VALVE REPAIR (MVR) USING 25MM EDWARDS MAGNA EASE BIOPROSTHESIS MITRAL  VALVE;  Surgeon: Ivin Poot, MD;  Location: [place];  Service: Open Heart Surgery;  Laterality: N/A;  . tracheostomy reversal    . TRACHEOSTOMY TUBE PLACEMENT N/A 11/09/2016   Procedure: TRACHEOSTOMY;  Surgeon: Melissa Montane, MD;  Location: [place];  Service: ENT;  Laterality: N/A;  . VAGINAL DELIVERY     x 6   HPI:  Per admitting  H&P "Deborah [name] is a 64 y.o. female with medical history significant for hypertension, end-stage renal disease on hemodialysis, chronic diastolic dysfunction CHF, status post mitral valve replacement, paroxysmal atrial fibrillation on anticoagulation therapy with Eliquis as well as tracheostomy status who presents to the ER for  evaluation of shortness of breath and increased yellowish secretions through her tracheostomy.  Patient states that she developed shortness of breath the night prior to her hospitalization associated with a cough and increased secretions through her tracheostomy.  She had attempted to suction herself at home but her tracheostomy kept getting clogged.  Her son states that he checked her pulse oximetry due to her complaints of shortness of breath and she was noted to have room air pulse oximetry in the 80s.  EMS was called and when they arrived patient had room air pulse oximetry of 89% that improved following treatment, administration.  She is currently on 5 L trach collar."   Assessment / Plan / Recommendation Clinical Impression  Pt presents with functional swallowing abilities at bedside with no overt s/s of aspiration. Pt has Trach from 2017. Currently receiving oxygen through Trach. Oral mech exam revealed structures to be functioning adequately with no weakness. Pt tolerated ice chips, thin liquid, and bites of graham cracker. Noted Pt took small bites and chewed thoroughly. Pt uses finger occlusion for voicing and has tried a PMV without success. She prefers to continue using finger occlusion for communication. Pt reports she had her swallowing tested after trach placement at [facility] and was cleared for all diets. Rec continue with current carb controlled diet. ST to follow up with toleration of diet. If any concerns of silent aspiration, will consider Mod Ba Swallow. SLP Visit Diagnosis: Dysphagia, oropharyngeal phase (R13.12)    Aspiration Risk  Mild aspiration risk    Diet Recommendation Regular   Liquid Administration via: Cup;Straw Medication Administration: Whole meds with liquid Supervision: Patient able to self feed Compensations: Slow rate Postural Changes: Seated upright at 90 degrees;Remain upright for at least 30 minutes after po intake    Other  Recommendations   Dialysis Patient   Follow up Recommendations   ST to follow up while at [facility]     Frequency and Duration   1-2 X         Prognosis  Good      Swallow Study   General Date of Onset: 11/01/20 HPI: Per admitting H&P "Deborah [name] is a 64 y.o. female with medical history significant for hypertension, end-stage renal disease on hemodialysis, chronic diastolic dysfunction CHF, status post mitral valve replacement, paroxysmal atrial fibrillation on anticoagulation therapy with Eliquis as well as tracheostomy status who presents to the ER for evaluation of shortness of breath and increased yellowish secretions through her tracheostomy.  Patient states that she developed shortness of breath the night prior to her hospitalization associated with a cough and increased secretions through her tracheostomy.  She had attempted to suction herself at home but her tracheostomy kept getting clogged.  Her son states that he checked her pulse oximetry due to her complaints of shortness of breath and she was noted to have room air pulse oximetry in the 80s.  EMS was called and when they arrived patient had room air pulse oximetry of 89% that improved following treatment, administration.  She is currently on 5 L trach collar." Type of Study: Bedside Swallow Evaluation Diet Prior to this Study: Regular Respiratory Status: Trach Collar History of Recent Intubation: No Behavior/Cognition: Alert;Cooperative;Pleasant mood  Oral Cavity Assessment: Within Functional Limits Oral Cavity - Dentition: Adequate natural dentition Vision: Functional for self-feeding Self-Feeding Abilities: Able to feed self Patient Positioning: Upright in bed Baseline Vocal Quality: Other (comment) (Strong and clear with finger occlusion) Volitional Cough: Strong Volitional Swallow: Able to elicit    Oral/Motor/Sensory Function Overall Oral Motor/Sensory Function: Within functional limits   Ice Chips Ice chips: Within functional limits Presentation: Self  Fed   Thin Liquid Thin Liquid: Within functional limits Presentation: Cup;Spoon;Straw    Nectar Thick Nectar Thick Liquid: Not tested   Honey Thick     Puree Puree: Within functional limits Presentation: Spoon   Solid     Solid: Within functional limits Presentation: Self Fed      Lucila Maine 11/03/2020,10:28 AM

## 2020-11-04 DIAGNOSIS — J9601 Acute respiratory failure with hypoxia: Secondary | ICD-10-CM | POA: Diagnosis not present

## 2020-11-04 LAB — CBC WITH DIFFERENTIAL/PLATELET
Abs Immature Granulocytes: 0.02 10*3/uL (ref 0.00–0.07)
Basophils Absolute: 0.1 10*3/uL (ref 0.0–0.1)
Basophils Relative: 1 %
Eosinophils Absolute: 0.6 10*3/uL — ABNORMAL HIGH (ref 0.0–0.5)
Eosinophils Relative: 7 %
HCT: 27.7 % — ABNORMAL LOW (ref 36.0–46.0)
Hemoglobin: 9.1 g/dL — ABNORMAL LOW (ref 12.0–15.0)
Immature Granulocytes: 0 %
Lymphocytes Relative: 12 %
Lymphs Abs: 1 10*3/uL (ref 0.7–4.0)
MCH: 27.7 pg (ref 26.0–34.0)
MCHC: 32.9 g/dL (ref 30.0–36.0)
MCV: 84.5 fL (ref 80.0–100.0)
Monocytes Absolute: 1 10*3/uL (ref 0.1–1.0)
Monocytes Relative: 12 %
Neutro Abs: 5.6 10*3/uL (ref 1.7–7.7)
Neutrophils Relative %: 68 %
Platelets: 171 10*3/uL (ref 150–400)
RBC: 3.28 MIL/uL — ABNORMAL LOW (ref 3.87–5.11)
RDW: 16.7 % — ABNORMAL HIGH (ref 11.5–15.5)
WBC: 8.2 10*3/uL (ref 4.0–10.5)
nRBC: 0 % (ref 0.0–0.2)

## 2020-11-04 LAB — COMPREHENSIVE METABOLIC PANEL
ALT: 23 U/L (ref 0–44)
AST: 18 U/L (ref 15–41)
Albumin: 3.2 g/dL — ABNORMAL LOW (ref 3.5–5.0)
Alkaline Phosphatase: 65 U/L (ref 38–126)
Anion gap: 9 (ref 5–15)
BUN: 28 mg/dL — ABNORMAL HIGH (ref 8–23)
CO2: 31 mmol/L (ref 22–32)
Calcium: 8.8 mg/dL — ABNORMAL LOW (ref 8.9–10.3)
Chloride: 97 mmol/L — ABNORMAL LOW (ref 98–111)
Creatinine, Ser: 4.89 mg/dL — ABNORMAL HIGH (ref 0.44–1.00)
GFR, Estimated: 9 mL/min — ABNORMAL LOW (ref >60)
Glucose, Bld: 72 mg/dL (ref 70–99)
Potassium: 3.9 mmol/L (ref 3.5–5.1)
Sodium: 137 mmol/L (ref 135–145)
Total Bilirubin: 1.3 mg/dL — ABNORMAL HIGH (ref 0.3–1.2)
Total Protein: 6.5 g/dL (ref 6.5–8.1)

## 2020-11-04 LAB — GLUCOSE, CAPILLARY
Glucose-Capillary: 99 mg/dL (ref 70–99)
Glucose-Capillary: 144 mg/dL — ABNORMAL HIGH (ref 70–99)
Glucose-Capillary: 157 mg/dL — ABNORMAL HIGH (ref 70–99)
Glucose-Capillary: 160 mg/dL — ABNORMAL HIGH (ref 70–99)

## 2020-11-04 LAB — PROCALCITONIN: Procalcitonin: 0.36 ng/mL

## 2020-11-04 MED ORDER — ACETYLCYSTEINE 20 % IN SOLN
4.0000 mL | Freq: Every day | RESPIRATORY_TRACT | Status: AC
  Filled 2020-11-04 (×2): qty 4

## 2020-11-04 NOTE — Progress Notes (Signed)
PROGRESS NOTE    Deborah [name]  GEZ:[id number] DOB: 1956/06/19 DOA: 11/01/2020 PCP: Leta Speller, MD   Brief Narrative:  48 black female HTN HFpEF MV replacement P A. 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  Layering fluid atelectasis?  Infection-EKG RVH  Administered sepsis secondary to pneumonia.  Started on ceftriaxone and Zithromax.  Subjective: Patient has no new complaint today.  Continue to require oxygen and desaturating without oxygen.  Continue to have significant trach secretions. She was able to speak full sentences by plugging her trach.  Assessment & Plan:   Principal Problem:   Acute hypoxemic respiratory failure ([name]) Active Problems:   Hypertension   Diabetes mellitus type 2, controlled ([name])   Acute on chronic diastolic heart failure ([name])   ESRD (end stage renal disease) ([name])   Atrial flutter ([name])   Acute diastolic CHF (congestive heart failure) ([place])   Tracheostomy status ([place])   CAP (community acquired pneumonia)  Sepsis secondary to pneumonia.POA.  Seems improving, although continue to require oxygen.  No oxygen use at baseline.  Procalcitonin at 0.37 but patient is also on dialysis.No significant concern for aspiration per speech evaluation. -Continue ceftriaxone and Zithromax. -Wean oxygen as tolerated. -Add Mucomyst to help with trach secretions.  Chronic tracheostomy.  Since 2017, not a candidate for decannulation.  At baseline he does not use any oxygen at home. -Try to wean oxygen. -Ambulate patient with pulse ox -she was desaturating without oxygen and requiring up to 9 to 10 L of oxygen.  ESRD.(MWF). Continue with her scheduled  dialysis per nephrology. -Continue home meds which include Renvela and midodrine.  CAD.  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  No chest pain. -Continue home dose of aspirin, Avapro, Coreg and Lipitor.  Chronic atrial flutter s/p DCCV 2017.  No acute concern. -Continue home dose of Eliquis and Coreg.  HFpEF.  History of HFrEF which has been improved now.  Recent echocardiogram with normal EF, LVH and moderate pulmonary hypertension. -Volume is being managed by dialysis.  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 CBG elevated in 150s. -Please start home Levemir at a low dose of 10 units. -Continue with SSI  History of depression.  No acute concern. -Continue home dose of Lexapro and trazodone.  Objective: Vitals:   11/04/20 0800 11/04/20 0815 11/04/20 1139 11/04/20 1524  BP:   (!) 162/68 (!) 153/67  Pulse:   72 70  Resp:   17 19  Temp:   99 F (37.2 C) 99.1 F (37.3 C)  TempSrc:   Oral Oral  SpO2: (!) 71% 98% 96% 99%  Weight:      Height:        Intake/Output Summary (Last 24 hours) at 11/04/2020 1537 Last data filed at 11/04/2020 1345 Gross per 24 hour  Intake 480 ml  Output 900 ml  Net -420 ml   Filed Weights   11/01/20 0911 11/03/20 0605 11/04/20 0601  Weight: 62.1 kg 66.3 kg 64.1 kg    Examination:  General.  Well-developed lady, with trach collar, in no acute distress. Pulmonary.  Few right basal rhonchi, normal respiratory effort. CV.  Regular rate and rhythm,  no JVD, rub or murmur. Abdomen.  Soft, nontender, nondistended, BS positive. CNS.  Alert and oriented x3.  No focal neurologic deficit. Extremities.  No edema, no cyanosis, pulses intact and symmetrical. Psychiatry.  Judgment and insight appears normal.    DVT prophylaxis: Heparin Code Status: Full Family Communication: Discussed with patient. Disposition Plan:  Status is: Inpatient  Remains inpatient appropriate because:Inpatient  level of care appropriate due to severity of illness   Dispo: The patient is from: Home              Anticipated d/c is to: Home              Anticipated d/c date is: 1 day              Patient currently is not medically stable to d/c.   Consultants:   Nephrology  Procedures:  Antimicrobials:  Ceftriaxone Zithromax  Data Reviewed: I have personally reviewed following labs and imaging studies  CBC: Recent Labs  Lab 11/01/20 0912 11/02/20 0355 11/03/20 0403 11/04/20 0357  WBC 12.3* 8.9 9.1 8.2  NEUTROABS  --   --  6.2 5.6  HGB 10.8* 9.5* 9.5* 9.1*  HCT 32.6* 27.5* 29.0* 27.7*  MCV 84.7 82.6 84.8 84.5  PLT 209 177 176 564   Basic Metabolic Panel: Recent Labs  Lab 11/01/20 0912 11/02/20 0355 11/03/20 0403 11/04/20 0357  NA 138 138 139 137  K 4.8 3.5 4.2 3.9  CL 98 99 101 97*  CO2 26 29 27 31   GLUCOSE 155* 99 157* 72  BUN 72* 28* 56* 28*  CREATININE 8.66* 4.77* 6.70* 4.89*  CALCIUM 9.6 8.8* 8.8* 8.8*  PHOS 3.4  --   --   --    GFR: Estimated Creatinine Clearance: 10.4 mL/min (A) (by C-G formula based on SCr of 4.89 mg/dL (H)). Liver Function Tests: Recent Labs  Lab 11/03/20 0403 11/04/20 0357  AST 18 18  ALT 25 23  ALKPHOS 61 65  BILITOT 1.1 1.3*  PROT 6.5 6.5  ALBUMIN 3.3* 3.2*   No results for input(s): LIPASE, AMYLASE in the last 168 hours. No results for input(s): AMMONIA in the last 168 hours. Coagulation Profile: No results for input(s): INR, PROTIME in the last 168 hours. Cardiac Enzymes: No results for input(s): CKTOTAL, CKMB, CKMBINDEX, TROPONINI in the last 168 hours. BNP (last 3 results) No results for input(s): PROBNP in the last 8760 hours. HbA1C: No results for input(s): HGBA1C in the last 72 hours. CBG: Recent Labs  Lab 11/03/20 0749 11/03/20 1639 11/03/20 1947 11/04/20 0755 11/04/20 1140  GLUCAP 157* 103* 228* 99 144*   Lipid Profile: No results for input(s): CHOL, HDL, LDLCALC, TRIG, CHOLHDL, LDLDIRECT in the last 72  hours. Thyroid Function Tests: No results for input(s): TSH, T4TOTAL, FREET4, T3FREE, THYROIDAB in the last 72 hours. Anemia Panel: No results for input(s): VITAMINB12, FOLATE, FERRITIN, TIBC, IRON, RETICCTPCT in the last 72 hours. Sepsis Labs: Recent Labs  Lab 11/02/20 0355 11/03/20 0403 11/04/20 0357  PROCALCITON 0.23 0.37 0.36    Recent Results (from the past 240 hour(s))  Resp Panel by RT-PCR (Flu A&B, Covid) Nasopharyngeal Swab     Status: None   Collection Time: 11/01/20  9:22 AM   Specimen: Nasopharyngeal Swab; Nasopharyngeal(NP) swabs in vial transport medium  Result Value Ref Range Status   SARS Coronavirus 2 by RT PCR NEGATIVE NEGATIVE Final    Comment: (NOTE) SARS-CoV-2 target nucleic acids are NOT DETECTED.  The SARS-CoV-2 RNA is generally  detectable in upper respiratory specimens during the acute phase of infection. The lowest concentration of SARS-CoV-2 viral copies this assay can detect is 138 copies/mL. A negative result does not preclude SARS-Cov-2 infection and should not be used as the sole basis for treatment or other patient management decisions. A negative result may occur with  improper specimen collection/handling, submission of specimen other than nasopharyngeal swab, presence of viral mutation(s) within the areas targeted by this assay, and inadequate number of viral copies(<138 copies/mL). A negative result must be combined with clinical observations, patient history, and epidemiological information. The expected result is Negative.  Fact Sheet for Patients:  EntrepreneurPulse.com.au  Fact Sheet for Healthcare Providers:  IncredibleEmployment.be  This test is no t yet approved or cleared by the [place] FDA and  has been authorized for detection and/or diagnosis of SARS-CoV-2 by FDA under an Emergency Use Authorization (EUA). This EUA will remain  in effect (meaning this test can be used) for the duration  of the COVID-19 declaration under Section 564(b)(1) of the Act, 21 U.S.C.section 360bbb-3(b)(1), unless the authorization is terminated  or revoked sooner.       Influenza A by PCR NEGATIVE NEGATIVE Final   Influenza B by PCR NEGATIVE NEGATIVE Final    Comment: (NOTE) The Xpert Xpress SARS-CoV-2/FLU/RSV plus assay is intended as an aid in the diagnosis of influenza from Nasopharyngeal swab specimens and should not be used as a sole basis for treatment. Nasal washings and aspirates are unacceptable for Xpert Xpress SARS-CoV-2/FLU/RSV testing.  Fact Sheet for Patients: EntrepreneurPulse.com.au  Fact Sheet for Healthcare Providers: IncredibleEmployment.be  This test is not yet approved or cleared by the [place] FDA and has been authorized for detection and/or diagnosis of SARS-CoV-2 by FDA under an Emergency Use Authorization (EUA). This EUA will remain in effect (meaning this test can be used) for the duration of the COVID-19 declaration under Section 564(b)(1) of the Act, 21 U.S.C. section 360bbb-3(b)(1), unless the authorization is terminated or revoked.  Performed at [facility], [address] [place], [place] [address]   MRSA PCR Screening     Status: None   Collection Time: 11/02/20 12:00 AM   Specimen: Nasopharyngeal  Result Value Ref Range Status   MRSA by PCR NEGATIVE NEGATIVE Final    Comment:        The GeneXpert MRSA Assay (FDA approved for NASAL specimens only), is one component of a comprehensive MRSA colonization surveillance program. It is not intended to diagnose MRSA infection nor to guide or monitor treatment for MRSA infections. Performed at [facility], [address] [place], [place] [address]      Radiology Studies: DG Chest 2 View  Result Date: 11/03/2020 CLINICAL DATA:  Pneumonia. EXAM: CHEST - 2 VIEW COMPARISON:  November 01, 2020. FINDINGS: Stable cardiomegaly. Status post  cardiac valve repair. Tracheostomy tube is in good position. No pneumothorax is noted. Stable right basilar subsegmental atelectasis is noted. Increased left basilar opacity is noted concerning for worsening atelectasis or pneumonia with small left pleural effusion. Bony thorax is unremarkable. IMPRESSION: Increased left basilar opacity is noted concerning for worsening atelectasis or pneumonia with small left pleural effusion. Electronically Signed   By: Marijo Conception M.D.   On: 11/03/2020 08:28    Scheduled Meds: . acetylcysteine  4 mL Nebulization Daily  . apixaban  2.5 mg Oral BID  . aspirin EC  81 mg Oral Daily  . atorvastatin  80 mg Oral q1800  . azithromycin  500 mg Oral Daily  . carvedilol  25 mg Oral BID  . Chlorhexidine Gluconate Cloth  6 each Topical Q0600  . escitalopram  10 mg Oral Daily  . furosemide  40 mg Oral Once per day on Sun Tue Thu Sat  . gabapentin  300 mg Oral QHS  . insulin aspart  0-9 Units Subcutaneous TID WC  . insulin detemir  10 Units Subcutaneous QHS  . irbesartan  150 mg Oral Daily  . midodrine  5 mg Oral Once per day on Mon Wed Fri  . multivitamin  1 tablet Oral Daily  . pantoprazole  40 mg Oral BID  . sevelamer carbonate  1,600 mg Oral TID WC  . sodium chloride flush  3 mL Intravenous Q12H  . traZODone  50 mg Oral Once per day on Mon Wed Fri   Continuous Infusions: . sodium chloride    . cefTRIAXone (ROCEPHIN)  IV 2 g (11/03/20 1650)     LOS: 3 days   Time spent: 25 minutes.  Lorella Nimrod, MD [facility]  If 7PM-7AM, please contact night-coverage [email or web address]  11/04/2020, 3:37 PM   This record has been created using Systems analyst. Errors have been sought and corrected,but may not always be located. Such creation errors do not reflect on the standard of care.

## 2020-11-04 NOTE — Progress Notes (Signed)
Chart reviewed. Pt visited. Requiring 8L O2 through trach. Pt reports no swallowing problems with meals today and yesterday. Denies coughing with Po intake. Encourged to take small bites and sips as well as periods of rest. Avoid eating while breathing is labored. Pt agreed. Voicing still strong with use of finger occlusion. ST to continue to follow up 2-3 days.

## 2020-11-04 NOTE — TOC Initial Note (Signed)
Transition of Care [facility]) - Initial/Assessment Note    Patient Details  Name: Deborah [name] MRN: [id number] Date of Birth: [birth year]  [facility]) CM/SW Contact:    Eileen Stanford, LCSW Phone Number: 11/04/2020, 9:30 AM  Clinical Narrative:   Pt states her family assist in her getting to appointments. Pt uses [facility] in [place] to get her medications. Pt was getting a HHRN through [facility] prior to admission however, pt wants to change. Pt states she is already working with a different company and states she does not need CSW to assist in setting this up. Pt states she has a phone call Tuesday to talk with the company.                 Expected Discharge Plan: [place] Barriers to Discharge: Continued Medical Work up   Patient Goals and CMS Choice Patient states their goals for this hospitalization and ongoing recovery are:: to get better      Expected Discharge Plan and Services Expected Discharge Plan: [place] In-house Referral: NA   Post Acute Care Choice: [place] arrangements for the past 2 months: Single Family Home                           HH Arranged: NA          Prior Living Arrangements/Services Living arrangements for the past 2 months: Single Family Home Lives with:: Self Patient language and need for interpreter reviewed:: Yes Do you feel safe going back to the place where you live?: Yes      Need for Family Participation in Patient Care: Yes (Comment) Care giver support system in place?: Yes (comment)   Criminal Activity/Legal Involvement Pertinent to Current Situation/Hospitalization: No - Comment as needed  Activities of Daily Living Home Assistive Devices/Equipment: Cane (specify quad or straight) ADL Screening (condition at time of admission) Patient's cognitive ability adequate to safely complete daily activities?: Yes Is the patient deaf or have difficulty hearing?: No Does the  patient have difficulty seeing, even when wearing glasses/contacts?: No Does the patient have difficulty concentrating, remembering, or making decisions?: No Patient able to express need for assistance with ADLs?: Yes Does the patient have difficulty dressing or bathing?: No Independently performs ADLs?: Yes (appropriate for developmental age) Does the patient have difficulty walking or climbing stairs?: No Weakness of Legs: None Weakness of Arms/Hands: None  Permission Sought/Granted Permission sought to share information with : Family Supports    Share Information with NAME: [name]     Permission granted to share info w Relationship: son     Emotional Assessment Appearance:: Appears stated age Attitude/Demeanor/Rapport: Engaged Affect (typically observed): Accepting Orientation: : Oriented to Situation, Oriented to  Time, Oriented to Place, Oriented to Self Alcohol / Substance Use: Not Applicable Psych Involvement: No (comment)  Admission diagnosis:  Acute respiratory failure with hypoxia ([name]) [U38.45] Acute diastolic CHF (congestive heart failure) ([place]) [I50.31] Community acquired pneumonia, unspecified laterality [J18.9] Patient Active Problem List   Diagnosis Date Noted  . Acute diastolic CHF (congestive heart failure) ([place]) 11/01/2020  . Tracheostomy status ([place]) 11/01/2020  . CAP (community acquired pneumonia) 11/01/2020  . Shortness of breath   . Fever   . Acute respiratory failure ([place]) 04/02/2020  . Acute CHF (congestive heart failure) ([place]) 10/17/2019  . Diabetic neuropathy ([place]) 10/16/2017  . Tracheobronchitis 10/11/2017  . Hemorrhage from arteriovenous dialysis graft ([place])  10/05/2017  . Sepsis ([place]) 09/08/2017  . Adjustment disorder with anxious mood 12/09/2016  . Bacteremia due to Staphylococcus aureus   . Staphylococcus aureus bacteremia with sepsis ([place])   . Type 2 diabetes mellitus with hyperlipidemia ([place])   . ST elevation myocardial infarction  involving left main coronary artery ([place])   . Palliative care encounter   . Advance care planning   . Vocal cord dysfunction   . Palliative care by specialist   . Goals of care, counseling/discussion   . Bacteremia   . End-stage renal disease on hemodialysis ([place])   . Paroxysmal atrial fibrillation ([name])   . Heart attack ([place])   . Uncontrolled type 2 diabetes mellitus with complication, without long-term current use of insulin ([place])   . Staphylococcus aureus bacteremia 11/12/2016  . Vocal cord paralysis   . Respiratory distress 11/02/2016  . Chronic anticoagulation   . Acute lower UTI   . Labile blood glucose   . Leukocytosis   . Subtherapeutic international normalized ratio (INR)   . Supratherapeutic INR   . Cough   . Oropharyngeal dysphagia   . Uncontrolled type 2 diabetes mellitus with complication, with long-term current use of insulin ([place])   . Physical debility 10/11/2016  . Metabolic encephalopathy 01/60/1093  . Debility   . ESRD (end stage renal disease) ([place])   . Type 2 diabetes mellitus with complication, with long-term current use of insulin ([place])   . Anemia of chronic disease   . Coronary artery disease involving native coronary artery of native heart without angina pectoris   . Acute on chronic respiratory failure with hypoxemia ([place])   . Atrial flutter ([place])   . Persistent cough   . Dysphonia   . Sleep disturbance   . Diabetes mellitus type 2 in nonobese ([name])   . Stage 3 chronic kidney disease ([place])   . NSTEMI (non-ST elevated myocardial infarction) ([place])   . Tachypnea   . Hyponatremia   . Lymphocytosis   . Acute blood loss anemia   . Pressure injury of skin 09/18/2016  . History of tracheostomy   . Delirium   . Encounter for attention to tracheostomy ([place])   . History of MVR with cardiopulmonary bypass   . Systolic congestive heart failure ([place])   . HCAP (healthcare-associated pneumonia)   . PAF (paroxysmal atrial fibrillation) ([place])   . Acute  systolic congestive heart failure ([place])   . AKI (acute kidney injury) ([place])   . Abnormal LFTs   . S/P MVR (mitral valve replacement)   . Mitral regurgitation 08/23/2016  . Acute on chronic diastolic heart failure ([place]) 08/18/2016  . Acute pulmonary edema ([name])   . Respiratory failure ([place]) 08/17/2016  . Cardiogenic shock ([place]) 08/17/2016  . Acute hypoxemic respiratory failure ([place])   . STEMI (ST elevation myocardial infarction) ([place]) 08/15/2016  . Noncompliance 01/25/2015  . Bereavement 07/09/2014  . Low back pain 02/17/2014  . Cervical cancer screening 11/20/2013  . Routine general medical examination at a health care facility 10/14/2013  . Chronic kidney disease, stage 3 ([place]) 06/14/2012  . Hypertension 03/08/2012  . Diabetes mellitus type 2, controlled ([place]) 03/08/2012  . Hyperlipidemia 03/08/2012   PCP:  Leta Speller, MD Pharmacy:   [facility]  [place] [address] Phone: [phone] Fax: [place], [place] - [address] [place] [address] Phone: [phone] Fax: [place] [phone] -  [place], [place] - [place] [address] Phone: [phone] Fax: [place] [address], [place] [address] Phone: [phone] Fax: [phone]     Social Determinants of Health (SDOH) Interventions    Readmission Risk Interventions Readmission Risk Prevention Plan 11/04/2020 04/02/2020 10/18/2019  Transportation Screening Complete Complete Complete  Medication Review Press photographer) (No Data) Complete -  PCP or Specialist appointment within 3-5 days of discharge Complete Complete Complete  HRI or Home Care Consult (No Data) Complete -  SW Recovery Care/Counseling Consult Complete - -  Palliative Care  Screening Not Applicable - -  [place] Not Applicable Complete Not Applicable  Some recent data might be hidden

## 2020-11-04 NOTE — Progress Notes (Signed)
[facility], [place] 11/04/20  Subjective:   LOS: 3   Doing fair Now requiring 8 L of oxygen supplementation Able to eat without nausea or vomiting Still has some cough and congestion   Objective:  Vital signs in last 24 hours:  Temp:  [98.4 F (36.9 C)-99.5 F (37.5 C)] 98.5 F (36.9 C) (12/02 0754) Pulse Rate:  [70-79] 76 (12/02 0754) Resp:  [14-22] 19 (12/02 0754) BP: (140-176)/(68-95) 154/76 (12/02 0754) SpO2:  [71 %-100 %] 98 % (12/02 0815) FiO2 (%):  [35 %-40 %] 40 % (12/02 0815) Weight:  [64.1 kg] 64.1 kg (12/02 0601)  Weight change: -2.177 kg Filed Weights   11/01/20 0911 11/03/20 0605 11/04/20 0601  Weight: 62.1 kg 66.3 kg 64.1 kg    Intake/Output:    Intake/Output Summary (Last 24 hours) at 11/04/2020 1100 Last data filed at 11/04/2020 1000 Gross per 24 hour  Intake 240 ml  Output 2903 ml  Net -2663 ml    Physical Exam: General:  In no acute distress  HEENT  normocephalic, atraumatic  Pulm/lungs  bilateral rhonchi +, supplemental O2 8 L via trach collar, dyspnea on exertion  CVS/Heart  regular  Abdomen:   Soft, nontender, nondistended  Extremities:  No peripheral edema  Neurologic:  Speech clear and appropriate   Skin:  No acute rashes  Left arm AVG  Basic Metabolic Panel:  Recent Labs  Lab 11/01/20 0912 11/01/20 0912 11/02/20 0355 11/03/20 0403 11/04/20 0357  NA 138  --  138 139 137  K 4.8  --  3.5 4.2 3.9  CL 98  --  99 101 97*  CO2 26  --  29 27 31   GLUCOSE 155*  --  99 157* 72  BUN 72*  --  28* 56* 28*  CREATININE 8.66*  --  4.77* 6.70* 4.89*  CALCIUM 9.6   < > 8.8* 8.8* 8.8*  PHOS 3.4  --   --   --   --    < > = values in this interval not displayed.     CBC: Recent Labs  Lab 10/28/20 1328 11/01/20 0912 11/02/20 0355 11/03/20 0403 11/04/20 0357  WBC 7.6 12.3* 8.9 9.1 8.2  NEUTROABS  --   --   --  6.2 5.6  HGB 11.4* 10.8* 9.5* 9.5* 9.1*  HCT 34.4* 32.6* 27.5* 29.0* 27.7*  MCV 84.3 84.7  82.6 84.8 84.5  PLT 175 209 177 176 171      Lab Results  Component Value Date   HEPBSAG NON REACTIVE 06/26/2020   HEPBSAB Non Reactive 08/17/2016   HEPBIGM Negative 08/26/2016      Microbiology:  Recent Results (from the past 240 hour(s))  Resp Panel by RT-PCR (Flu A&B, Covid) Nasopharyngeal Swab     Status: None   Collection Time: 11/01/20  9:22 AM   Specimen: Nasopharyngeal Swab; Nasopharyngeal(NP) swabs in vial transport medium  Result Value Ref Range Status   SARS Coronavirus 2 by RT PCR NEGATIVE NEGATIVE Final    Comment: (NOTE) SARS-CoV-2 target nucleic acids are NOT DETECTED.  The SARS-CoV-2 RNA is generally detectable in upper respiratory specimens during the acute phase of infection. The lowest concentration of SARS-CoV-2 viral copies this assay can detect is 138 copies/mL. A negative result does not preclude SARS-Cov-2 infection and should not be used as the sole basis for treatment or other patient management decisions. A negative result may occur with  improper specimen collection/handling, submission of specimen other than nasopharyngeal swab, presence  of viral mutation(s) within the areas targeted by this assay, and inadequate number of viral copies(<138 copies/mL). A negative result must be combined with clinical observations, patient history, and epidemiological information. The expected result is Negative.  Fact Sheet for Patients:  EntrepreneurPulse.com.au  Fact Sheet for Healthcare Providers:  IncredibleEmployment.be  This test is no t yet approved or cleared by the [place] FDA and  has been authorized for detection and/or diagnosis of SARS-CoV-2 by FDA under an Emergency Use Authorization (EUA). This EUA will remain  in effect (meaning this test can be used) for the duration of the COVID-19 declaration under Section 564(b)(1) of the Act, 21 U.S.C.section 360bbb-3(b)(1), unless the authorization is  terminated  or revoked sooner.       Influenza A by PCR NEGATIVE NEGATIVE Final   Influenza B by PCR NEGATIVE NEGATIVE Final    Comment: (NOTE) The Xpert Xpress SARS-CoV-2/FLU/RSV plus assay is intended as an aid in the diagnosis of influenza from Nasopharyngeal swab specimens and should not be used as a sole basis for treatment. Nasal washings and aspirates are unacceptable for Xpert Xpress SARS-CoV-2/FLU/RSV testing.  Fact Sheet for Patients: EntrepreneurPulse.com.au  Fact Sheet for Healthcare Providers: IncredibleEmployment.be  This test is not yet approved or cleared by the [place] FDA and has been authorized for detection and/or diagnosis of SARS-CoV-2 by FDA under an Emergency Use Authorization (EUA). This EUA will remain in effect (meaning this test can be used) for the duration of the COVID-19 declaration under Section 564(b)(1) of the Act, 21 U.S.C. section 360bbb-3(b)(1), unless the authorization is terminated or revoked.  Performed at [facility], [address] [place], [place] [address]   MRSA PCR Screening     Status: None   Collection Time: 11/02/20 12:00 AM   Specimen: Nasopharyngeal  Result Value Ref Range Status   MRSA by PCR NEGATIVE NEGATIVE Final    Comment:        The GeneXpert MRSA Assay (FDA approved for NASAL specimens only), is one component of a comprehensive MRSA colonization surveillance program. It is not intended to diagnose MRSA infection nor to guide or monitor treatment for MRSA infections. Performed at [facility], [address] [place], [place] [address]     Coagulation Studies: No results for input(s): LABPROT, INR in the last 72 hours.  Urinalysis: No results for input(s): COLORURINE, LABSPEC, PHURINE, GLUCOSEU, HGBUR, BILIRUBINUR, KETONESUR, PROTEINUR, UROBILINOGEN, NITRITE, LEUKOCYTESUR in the last 72 hours.  Invalid input(s): APPERANCEUR    Imaging: DG  Chest 2 View  Result Date: 11/03/2020 CLINICAL DATA:  Pneumonia. EXAM: CHEST - 2 VIEW COMPARISON:  November 01, 2020. FINDINGS: Stable cardiomegaly. Status post cardiac valve repair. Tracheostomy tube is in good position. No pneumothorax is noted. Stable right basilar subsegmental atelectasis is noted. Increased left basilar opacity is noted concerning for worsening atelectasis or pneumonia with small left pleural effusion. Bony thorax is unremarkable. IMPRESSION: Increased left basilar opacity is noted concerning for worsening atelectasis or pneumonia with small left pleural effusion. Electronically Signed   By: Marijo Conception M.D.   On: 11/03/2020 08:28     Medications:   . sodium chloride    . cefTRIAXone (ROCEPHIN)  IV 2 g (11/03/20 1650)   . apixaban  2.5 mg Oral BID  . aspirin EC  81 mg Oral Daily  . atorvastatin  80 mg Oral q1800  . azithromycin  500 mg Oral Daily  . carvedilol  25 mg Oral BID  . Chlorhexidine Gluconate  Cloth  6 each Topical Q0600  . escitalopram  10 mg Oral Daily  . furosemide  40 mg Oral Once per day on Sun Tue Thu Sat  . gabapentin  300 mg Oral QHS  . insulin aspart  0-9 Units Subcutaneous TID WC  . insulin detemir  10 Units Subcutaneous QHS  . irbesartan  150 mg Oral Daily  . midodrine  5 mg Oral Once per day on Mon Wed Fri  . multivitamin  1 tablet Oral Daily  . pantoprazole  40 mg Oral BID  . sevelamer carbonate  1,600 mg Oral TID WC  . sodium chloride flush  3 mL Intravenous Q12H  . traZODone  50 mg Oral Once per day on Mon Wed Fri   sodium chloride, acetaminophen, albuterol, sodium chloride flush  Assessment/ Plan:  64 y.o. female with  was admitted on 11/01/2020 for  Principal Problem:   Acute hypoxemic respiratory failure ([name]) Active Problems:   Hypertension   Diabetes mellitus type 2, controlled ([place])   Acute on chronic diastolic heart failure ([name])   ESRD (end stage renal disease) ([name])   Atrial flutter ([name])   Acute diastolic CHF  (congestive heart failure) ([place])   Tracheostomy status ([place])   CAP (community acquired pneumonia)  Acute respiratory failure with hypoxia ([place]) [R97.58] Acute diastolic CHF (congestive heart failure) ([place]) [I50.31] Community acquired pneumonia, unspecified laterality [J18.9]   Outpatient regimen: 3 times per week, MWF, estimated dry weight 61.5 kg, left arm AV graft, [facility]  #. ESRD MWF Schedule We will schedule dialysis for tomorrow We will continue Monday Wednesday Friday schedule  #. Anemia of CKD  Lab Results  Component Value Date   HGB 9.1 (L) 11/04/2020    #. Secondary hyperparathyroidism of renal origin N 25.81      Component Value Date/Time   PTH 270 (H) 10/03/2017 0949   Lab Results  Component Value Date   PHOS 3.4 11/01/2020  Continue Renvela   #. Diabetes type 2 with CKD Hgb A1c MFr Bld (%)  Date Value  11/01/2020 7.2 (H)  Blood glucose readings within acceptable range Management per primary care team  # Pneumonia Requiring 9 L of O2 via trach collar Respirations labored, with accessory muscle use Receiving Ceftriaxone and Azithromycin    LOS: [place] 12/2/202111:00 AM  [place], [place]

## 2020-11-04 NOTE — Care Management Important Message (Signed)
Important Message  Patient Details  Name: Deborah [name] MRN: [id number] Date of Birth: [birth year]   Medicare Important Message Given:  Yes     Dannette Barbara 11/04/2020, 2:58 PM

## 2020-11-05 DIAGNOSIS — J9601 Acute respiratory failure with hypoxia: Secondary | ICD-10-CM | POA: Diagnosis not present

## 2020-11-05 LAB — COMPREHENSIVE METABOLIC PANEL
ALT: 19 U/L (ref 0–44)
AST: 16 U/L (ref 15–41)
Albumin: 3.1 g/dL — ABNORMAL LOW (ref 3.5–5.0)
Alkaline Phosphatase: 60 U/L (ref 38–126)
Anion gap: 11 (ref 5–15)
BUN: 49 mg/dL — ABNORMAL HIGH (ref 8–23)
CO2: 27 mmol/L (ref 22–32)
Calcium: 9 mg/dL (ref 8.9–10.3)
Chloride: 97 mmol/L — ABNORMAL LOW (ref 98–111)
Creatinine, Ser: 6.77 mg/dL — ABNORMAL HIGH (ref 0.44–1.00)
GFR, Estimated: 6 mL/min — ABNORMAL LOW (ref >60)
Glucose, Bld: 131 mg/dL — ABNORMAL HIGH (ref 70–99)
Potassium: 4.2 mmol/L (ref 3.5–5.1)
Sodium: 135 mmol/L (ref 135–145)
Total Bilirubin: 1.1 mg/dL (ref 0.3–1.2)
Total Protein: 6.4 g/dL — ABNORMAL LOW (ref 6.5–8.1)

## 2020-11-05 LAB — CBC WITH DIFFERENTIAL/PLATELET
Abs Immature Granulocytes: 0.03 10*3/uL (ref 0.00–0.07)
Basophils Absolute: 0.1 10*3/uL (ref 0.0–0.1)
Basophils Relative: 1 %
Eosinophils Absolute: 0.8 10*3/uL — ABNORMAL HIGH (ref 0.0–0.5)
Eosinophils Relative: 9 %
HCT: 26.1 % — ABNORMAL LOW (ref 36.0–46.0)
Hemoglobin: 8.8 g/dL — ABNORMAL LOW (ref 12.0–15.0)
Immature Granulocytes: 0 %
Lymphocytes Relative: 13 %
Lymphs Abs: 1.1 10*3/uL (ref 0.7–4.0)
MCH: 28.4 pg (ref 26.0–34.0)
MCHC: 33.7 g/dL (ref 30.0–36.0)
MCV: 84.2 fL (ref 80.0–100.0)
Monocytes Absolute: 1 10*3/uL (ref 0.1–1.0)
Monocytes Relative: 11 %
Neutro Abs: 5.5 10*3/uL (ref 1.7–7.7)
Neutrophils Relative %: 66 %
Platelets: 185 10*3/uL (ref 150–400)
RBC: 3.1 MIL/uL — ABNORMAL LOW (ref 3.87–5.11)
RDW: 16.9 % — ABNORMAL HIGH (ref 11.5–15.5)
WBC: 8.4 10*3/uL (ref 4.0–10.5)
nRBC: 0 % (ref 0.0–0.2)

## 2020-11-05 LAB — GLUCOSE, CAPILLARY
Glucose-Capillary: 86 mg/dL (ref 70–99)
Glucose-Capillary: 162 mg/dL — ABNORMAL HIGH (ref 70–99)
Glucose-Capillary: 240 mg/dL — ABNORMAL HIGH (ref 70–99)

## 2020-11-05 MED ORDER — EPOETIN ALFA 10000 UNIT/ML IJ SOLN: 10000.0000 [IU] | INTRAMUSCULAR | Status: DC

## 2020-11-05 MED ORDER — POLYETHYLENE GLYCOL 3350 17 G PO PACK
17.0000 g | PACK | Freq: Every day | ORAL | Status: DC
  Filled 2020-11-05 (×3): qty 1

## 2020-11-05 NOTE — Progress Notes (Signed)
PROGRESS NOTE    Deborah [name]  FGH:[id number] DOB: [birth year] DOA: 11/01/2020 PCP: Leta Speller, MD   Brief Narrative:  38 black female HTN HFpEF MV replacement P A. 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  Layering fluid atelectasis?  Infection-EKG RVH  Administered sepsis secondary to pneumonia.  Started on ceftriaxone and Zithromax.  Subjective: Patient was seen during dialysis today.  No new complaint.  Continue to have increased secretions through trach and still requiring oxygen.  Assessment & Plan:   Principal Problem:   Acute hypoxemic respiratory failure ([name]) Active Problems:   Hypertension   Diabetes mellitus type 2, controlled ([name])   Acute on chronic diastolic heart failure ([name])   ESRD (end stage renal disease) ([name])   Atrial flutter ([name])   Acute diastolic CHF (congestive heart failure) ([place])   Tracheostomy status ([place])   CAP (community acquired pneumonia)  Sepsis secondary to pneumonia.POA.  Seems improving, although continue to require oxygen.  No oxygen use at baseline.  Procalcitonin at 0.37 but patient is also on dialysis.No significant concern for aspiration per speech evaluation. -Continue ceftriaxone and Zithromax-will complete the course today. -Wean oxygen as tolerated. -Ambulate patient to determine the home oxygen requirement.  Chronic tracheostomy.  Since 2017, not a candidate for decannulation.  At baseline he does not use any oxygen at home. -Try to wean oxygen. -We will ambulate patient again today after dialysis to determine the home oxygen requirement as patient might have to go home with oxygen.  ESRD.(MWF). Continue with her  scheduled dialysis per nephrology. -Continue home meds which include Renvela and midodrine.  CAD.  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  No chest pain. -Continue home dose of aspirin, Avapro, Coreg and Lipitor.  Chronic atrial flutter s/p DCCV 2017.  No acute concern. -Continue home dose of Eliquis and Coreg.  HFpEF.  History of HFrEF which has been improved now.  Recent echocardiogram with normal EF, LVH and moderate pulmonary hypertension. -Volume is being managed by dialysis.  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 CBG within goal -Continue Levemir at a low dose of 10 units. -Continue with SSI  History of depression.  No acute concern. -Continue home dose of Lexapro and trazodone.  Objective: Vitals:   11/05/20 1330 11/05/20 1345 11/05/20 1400 11/05/20 1504  BP: (!) 141/69 (!) 145/72 (!) 151/121 (!) 151/63  Pulse: 80 81 80 79  Resp: 18 17 20 16   Temp:    99 F (37.2 C)  TempSrc:    Oral  SpO2:    98%  Weight:      Height:        Intake/Output Summary (Last 24 hours) at 11/05/2020 1629 Last data filed at 11/05/2020 1400 Gross per 24 hour  Intake --  Output 2000 ml  Net -2000 ml   Filed Weights   11/03/20 0605 11/04/20 0601 11/05/20 0426  Weight: 66.3 kg 64.1 kg 65 kg    Examination:  General.  Well-developed lady, getting dialysis, in no acute distress. Pulmonary.  Lungs clear bilaterally on anterior auscultation, normal respiratory effort. CV.  Regular rate and rhythm, no JVD, rub or murmur. Abdomen.  Soft,  nontender, nondistended, BS positive. CNS.  Alert and oriented x3.  No focal neurologic deficit. Extremities.  No edema, no cyanosis, pulses intact and symmetrical. Psychiatry.  Judgment and insight appears normal.   DVT prophylaxis: Heparin Code Status: Full Family Communication: Discussed with patient. Disposition Plan:  Status is: Inpatient  Remains inpatient appropriate  because:Inpatient level of care appropriate due to severity of illness   Dispo: The patient is from: Home              Anticipated d/c is to: Home              Anticipated d/c date is: 1 day              Patient currently is not medically stable to d/c.   Consultants:   Nephrology  Procedures:  Antimicrobials:   Data Reviewed: I have personally reviewed following labs and imaging studies  CBC: Recent Labs  Lab 11/01/20 0912 11/02/20 0355 11/03/20 0403 11/04/20 0357 11/05/20 0414  WBC 12.3* 8.9 9.1 8.2 8.4  NEUTROABS  --   --  6.2 5.6 5.5  HGB 10.8* 9.5* 9.5* 9.1* 8.8*  HCT 32.6* 27.5* 29.0* 27.7* 26.1*  MCV 84.7 82.6 84.8 84.5 84.2  PLT 209 177 176 171 845   Basic Metabolic Panel: Recent Labs  Lab 11/01/20 0912 11/02/20 0355 11/03/20 0403 11/04/20 0357 11/05/20 0414  NA 138 138 139 137 135  K 4.8 3.5 4.2 3.9 4.2  CL 98 99 101 97* 97*  CO2 26 29 27 31 27   GLUCOSE 155* 99 157* 72 131*  BUN 72* 28* 56* 28* 49*  CREATININE 8.66* 4.77* 6.70* 4.89* 6.77*  CALCIUM 9.6 8.8* 8.8* 8.8* 9.0  PHOS 3.4  --   --   --   --    GFR: Estimated Creatinine Clearance: 7.5 mL/min (A) (by C-G formula based on SCr of 6.77 mg/dL (H)). Liver Function Tests: Recent Labs  Lab 11/03/20 0403 11/04/20 0357 11/05/20 0414  AST 18 18 16   ALT 25 23 19   ALKPHOS 61 65 60  BILITOT 1.1 1.3* 1.1  PROT 6.5 6.5 6.4*  ALBUMIN 3.3* 3.2* 3.1*   No results for input(s): LIPASE, AMYLASE in the last 168 hours. No results for input(s): AMMONIA in the last 168 hours. Coagulation Profile: No results for input(s): INR, PROTIME in the last 168 hours. Cardiac Enzymes: No results for input(s): CKTOTAL, CKMB, CKMBINDEX, TROPONINI in the last 168 hours. BNP (last 3 results) No results for input(s): PROBNP in the last 8760 hours. HbA1C: No results for input(s): HGBA1C in the last 72 hours. CBG: Recent Labs  Lab 11/04/20 0755 11/04/20 1140 11/04/20 1642 11/04/20 2158 11/05/20 0751  GLUCAP 99  144* 157* 160* 86   Lipid Profile: No results for input(s): CHOL, HDL, LDLCALC, TRIG, CHOLHDL, LDLDIRECT in the last 72 hours. Thyroid Function Tests: No results for input(s): TSH, T4TOTAL, FREET4, T3FREE, THYROIDAB in the last 72 hours. Anemia Panel: No results for input(s): VITAMINB12, FOLATE, FERRITIN, TIBC, IRON, RETICCTPCT in the last 72 hours. Sepsis Labs: Recent Labs  Lab 11/02/20 0355 11/03/20 0403 11/04/20 0357  PROCALCITON 0.23 0.37 0.36    Recent Results (from the past 240 hour(s))  Resp Panel by RT-PCR (Flu A&B, Covid) Nasopharyngeal Swab     Status: None   Collection Time: 11/01/20  9:22 AM   Specimen: Nasopharyngeal Swab; Nasopharyngeal(NP) swabs in vial transport medium  Result Value Ref Range Status   SARS Coronavirus 2 by RT PCR NEGATIVE NEGATIVE Final  Comment: (NOTE) SARS-CoV-2 target nucleic acids are NOT DETECTED.  The SARS-CoV-2 RNA is generally detectable in upper respiratory specimens during the acute phase of infection. The lowest concentration of SARS-CoV-2 viral copies this assay can detect is 138 copies/mL. A negative result does not preclude SARS-Cov-2 infection and should not be used as the sole basis for treatment or other patient management decisions. A negative result may occur with  improper specimen collection/handling, submission of specimen other than nasopharyngeal swab, presence of viral mutation(s) within the areas targeted by this assay, and inadequate number of viral copies(<138 copies/mL). A negative result must be combined with clinical observations, patient history, and epidemiological information. The expected result is Negative.  Fact Sheet for Patients:  EntrepreneurPulse.com.au  Fact Sheet for Healthcare Providers:  IncredibleEmployment.be  This test is no t yet approved or cleared by the [place] FDA and  has been authorized for detection and/or diagnosis of SARS-CoV-2 by FDA  under an Emergency Use Authorization (EUA). This EUA will remain  in effect (meaning this test can be used) for the duration of the COVID-19 declaration under Section 564(b)(1) of the Act, 21 U.S.C.section 360bbb-3(b)(1), unless the authorization is terminated  or revoked sooner.       Influenza A by PCR NEGATIVE NEGATIVE Final   Influenza B by PCR NEGATIVE NEGATIVE Final    Comment: (NOTE) The Xpert Xpress SARS-CoV-2/FLU/RSV plus assay is intended as an aid in the diagnosis of influenza from Nasopharyngeal swab specimens and should not be used as a sole basis for treatment. Nasal washings and aspirates are unacceptable for Xpert Xpress SARS-CoV-2/FLU/RSV testing.  Fact Sheet for Patients: EntrepreneurPulse.com.au  Fact Sheet for Healthcare Providers: IncredibleEmployment.be  This test is not yet approved or cleared by the [place] FDA and has been authorized for detection and/or diagnosis of SARS-CoV-2 by FDA under an Emergency Use Authorization (EUA). This EUA will remain in effect (meaning this test can be used) for the duration of the COVID-19 declaration under Section 564(b)(1) of the Act, 21 U.S.C. section 360bbb-3(b)(1), unless the authorization is terminated or revoked.  Performed at [facility], [address] [place], [place] [address]   MRSA PCR Screening     Status: None   Collection Time: 11/02/20 12:00 AM   Specimen: Nasopharyngeal  Result Value Ref Range Status   MRSA by PCR NEGATIVE NEGATIVE Final    Comment:        The GeneXpert MRSA Assay (FDA approved for NASAL specimens only), is one component of a comprehensive MRSA colonization surveillance program. It is not intended to diagnose MRSA infection nor to guide or monitor treatment for MRSA infections. Performed at [facility], [address] [place],  [address]      Radiology Studies: No results found.  Scheduled Meds: .  acetylcysteine  4 mL Nebulization Daily  . apixaban  2.5 mg Oral BID  . aspirin EC  81 mg Oral Daily  . atorvastatin  80 mg Oral q1800  . azithromycin  500 mg Oral Daily  . carvedilol  25 mg Oral BID  . Chlorhexidine Gluconate Cloth  6 each Topical Q0600  . [START ON 11/08/2020] epoetin (EPOGEN/PROCRIT) injection  10,000 Units Intravenous Q M,W,F-HD  . escitalopram  10 mg Oral Daily  . furosemide  40 mg Oral Once per day on Sun Tue Thu Sat  . gabapentin  300 mg Oral QHS  . insulin aspart  0-9 Units Subcutaneous TID WC  . insulin detemir  10 Units Subcutaneous  QHS  . irbesartan  150 mg Oral Daily  . midodrine  5 mg Oral Once per day on Mon Wed Fri  . multivitamin  1 tablet Oral Daily  . pantoprazole  40 mg Oral BID  . polyethylene glycol  17 g Oral Daily  . sevelamer carbonate  1,600 mg Oral TID WC  . sodium chloride flush  3 mL Intravenous Q12H  . traZODone  50 mg Oral Once per day on Mon Wed Fri   Continuous Infusions: . sodium chloride    . cefTRIAXone (ROCEPHIN)  IV 2 g (11/04/20 1727)     LOS: 4 days   Time spent: 25 minutes.  Lorella Nimrod, MD [facility]  If 7PM-7AM, please contact night-coverage [email or web address]  11/05/2020, 4:29 PM   This record has been created using Systems analyst. Errors have been sought and corrected,but may not always be located. Such creation errors do not reflect on the standard of care.

## 2020-11-05 NOTE — Progress Notes (Signed)
Mobility Specialist - Progress Note   11/05/20 1225  Mobility  Activity Contraindicated/medical hold  Mobility performed by Mobility specialist    Pt receiving HD tx at this time. Will hold and re-attempt at a later time when pt is available, if time permits.    Celena Lanius Mobility Specialist  11/05/20, 12:26 PM

## 2020-11-05 NOTE — Progress Notes (Signed)
[facility], [place] 11/05/20  Subjective:   LOS: 4   Seen patient in dialysis today, tolerating treatment well   HEMODIALYSIS FLOWSHEET:  Blood Flow Rate (mL/min): 400 mL/min Arterial Pressure (mmHg): -130 mmHg Venous Pressure (mmHg): 190 mmHg Transmembrane Pressure (mmHg): 70 mmHg Ultrafiltration Rate (mL/min): 710 mL/min Dialysate Flow Rate (mL/min): 600 ml/min Conductivity: Machine : 14 Conductivity: Machine : 14 Dialysis Fluid Bolus: Normal Saline Bolus Amount (mL): 250 mL    Objective:  Vital signs in last 24 hours:  Temp:  [97.8 F (36.6 C)-99.1 F (37.3 C)] 98.4 F (36.9 C) (12/03 1016) Pulse Rate:  [68-81] 80 (12/03 1330) Resp:  [5-32] 18 (12/03 1330) BP: (132-183)/(67-81) 141/69 (12/03 1330) SpO2:  [90 %-100 %] 100 % (12/03 1016) FiO2 (%):  [35 %-40 %] 35 % (12/02 2100) Weight:  [65 kg] 65 kg (12/03 0426)  Weight change: 0.953 kg Filed Weights   11/03/20 0605 11/04/20 0601 11/05/20 0426  Weight: 66.3 kg 64.1 kg 65 kg    Intake/Output:   No intake or output data in the 24 hours ending 11/05/20 1411  Physical Exam: General:  Resting in bed, receiving dialysis treatment  HEENT  normocephalic, atraumatic, tracheostomy +  Pulm/lungs  lungs with bilateral rhonchi +, supplemental O2 8 L via trach collar, dyspnea on exertion  CVS/Heart  regular rate and rhythm, HR in 70s  Abdomen:   Soft, nontender, nondistended  Extremities:  No peripheral edema  Neurologic:  Awake, alert, oriented   Skin:  No acute rashes  Left arm AVG  Basic Metabolic Panel:  Recent Labs  Lab 11/01/20 0912 11/01/20 0912 11/02/20 0355 11/02/20 0355 11/03/20 0403 11/04/20 0357 11/05/20 0414  NA 138  --  138  --  139 137 135  K 4.8  --  3.5  --  4.2 3.9 4.2  CL 98  --  99  --  101 97* 97*  CO2 26  --  29  --  27 31 27   GLUCOSE 155*  --  99  --  157* 72 131*  BUN 72*  --  28*  --  56* 28* 49*  CREATININE 8.66*  --  4.77*  --  6.70* 4.89* 6.77*   CALCIUM 9.6   < > 8.8*   < > 8.8* 8.8* 9.0  PHOS 3.4  --   --   --   --   --   --    < > = values in this interval not displayed.     CBC: Recent Labs  Lab 11/01/20 0912 11/02/20 0355 11/03/20 0403 11/04/20 0357 11/05/20 0414  WBC 12.3* 8.9 9.1 8.2 8.4  NEUTROABS  --   --  6.2 5.6 5.5  HGB 10.8* 9.5* 9.5* 9.1* 8.8*  HCT 32.6* 27.5* 29.0* 27.7* 26.1*  MCV 84.7 82.6 84.8 84.5 84.2  PLT 209 177 176 171 185      Lab Results  Component Value Date   HEPBSAG NON REACTIVE 06/26/2020   HEPBSAB Non Reactive 08/17/2016   HEPBIGM Negative 08/26/2016      Microbiology:  Recent Results (from the past 240 hour(s))  Resp Panel by RT-PCR (Flu A&B, Covid) Nasopharyngeal Swab     Status: None   Collection Time: 11/01/20  9:22 AM   Specimen: Nasopharyngeal Swab; Nasopharyngeal(NP) swabs in vial transport medium  Result Value Ref Range Status   SARS Coronavirus 2 by RT PCR NEGATIVE NEGATIVE Final    Comment: (NOTE) SARS-CoV-2 target nucleic acids are NOT DETECTED.  The SARS-CoV-2 RNA is generally detectable in upper respiratory specimens during the acute phase of infection. The lowest concentration of SARS-CoV-2 viral copies this assay can detect is 138 copies/mL. A negative result does not preclude SARS-Cov-2 infection and should not be used as the sole basis for treatment or other patient management decisions. A negative result may occur with  improper specimen collection/handling, submission of specimen other than nasopharyngeal swab, presence of viral mutation(s) within the areas targeted by this assay, and inadequate number of viral copies(<138 copies/mL). A negative result must be combined with clinical observations, patient history, and epidemiological information. The expected result is Negative.  Fact Sheet for Patients:  EntrepreneurPulse.com.au  Fact Sheet for Healthcare Providers:  IncredibleEmployment.be  This test is no t yet  approved or cleared by the [place] FDA and  has been authorized for detection and/or diagnosis of SARS-CoV-2 by FDA under an Emergency Use Authorization (EUA). This EUA will remain  in effect (meaning this test can be used) for the duration of the COVID-19 declaration under Section 564(b)(1) of the Act, 21 U.S.C.section 360bbb-3(b)(1), unless the authorization is terminated  or revoked sooner.       Influenza A by PCR NEGATIVE NEGATIVE Final   Influenza B by PCR NEGATIVE NEGATIVE Final    Comment: (NOTE) The Xpert Xpress SARS-CoV-2/FLU/RSV plus assay is intended as an aid in the diagnosis of influenza from Nasopharyngeal swab specimens and should not be used as a sole basis for treatment. Nasal washings and aspirates are unacceptable for Xpert Xpress SARS-CoV-2/FLU/RSV testing.  Fact Sheet for Patients: EntrepreneurPulse.com.au  Fact Sheet for Healthcare Providers: IncredibleEmployment.be  This test is not yet approved or cleared by the [place] FDA and has been authorized for detection and/or diagnosis of SARS-CoV-2 by FDA under an Emergency Use Authorization (EUA). This EUA will remain in effect (meaning this test can be used) for the duration of the COVID-19 declaration under Section 564(b)(1) of the Act, 21 U.S.C. section 360bbb-3(b)(1), unless the authorization is terminated or revoked.  Performed at [facility], [address] [place], [place] [address]   MRSA PCR Screening     Status: None   Collection Time: 11/02/20 12:00 AM   Specimen: Nasopharyngeal  Result Value Ref Range Status   MRSA by PCR NEGATIVE NEGATIVE Final    Comment:        The GeneXpert MRSA Assay (FDA approved for NASAL specimens only), is one component of a comprehensive MRSA colonization surveillance program. It is not intended to diagnose MRSA infection nor to guide or monitor treatment for MRSA infections. Performed at [facility], [address] [place],  [address]     Coagulation Studies: No results for input(s): LABPROT, INR in the last 72 hours.  Urinalysis: No results for input(s): COLORURINE, LABSPEC, PHURINE, GLUCOSEU, HGBUR, BILIRUBINUR, KETONESUR, PROTEINUR, UROBILINOGEN, NITRITE, LEUKOCYTESUR in the last 72 hours.  Invalid input(s): APPERANCEUR    Imaging: No results found.   Medications:   . sodium chloride    . cefTRIAXone (ROCEPHIN)  IV 2 g (11/04/20 1727)   . acetylcysteine  4 mL Nebulization Daily  . apixaban  2.5 mg Oral BID  . aspirin EC  81 mg Oral Daily  . atorvastatin  80 mg Oral q1800  . azithromycin  500 mg Oral Daily  . carvedilol  25 mg Oral BID  . Chlorhexidine Gluconate Cloth  6 each Topical Q0600  . escitalopram  10 mg Oral Daily  . furosemide  40 mg  Oral Once per day on Sun Tue Thu Sat  . gabapentin  300 mg Oral QHS  . insulin aspart  0-9 Units Subcutaneous TID WC  . insulin detemir  10 Units Subcutaneous QHS  . irbesartan  150 mg Oral Daily  . midodrine  5 mg Oral Once per day on Mon Wed Fri  . multivitamin  1 tablet Oral Daily  . pantoprazole  40 mg Oral BID  . sevelamer carbonate  1,600 mg Oral TID WC  . sodium chloride flush  3 mL Intravenous Q12H  . traZODone  50 mg Oral Once per day on Mon Wed Fri   sodium chloride, acetaminophen, albuterol, sodium chloride flush  Assessment/ Plan:  64 y.o. female with  was admitted on 11/01/2020 for  Principal Problem:   Acute hypoxemic respiratory failure ([name]) Active Problems:   Hypertension   Diabetes mellitus type 2, controlled ([place])   Acute on chronic diastolic heart failure ([name])   ESRD (end stage renal disease) ([name])   Atrial flutter ([name])   Acute diastolic CHF (congestive heart failure) ([place])   Tracheostomy status ([place])   CAP (community acquired pneumonia)  Acute respiratory failure with hypoxia ([place]) [B51.02] Acute diastolic CHF (congestive heart failure) ([place]) [I50.31] Community  acquired pneumonia, unspecified laterality [J18.9]   Outpatient regimen: 3 times per week, MWF, estimated dry weight 61.5 kg, left arm AV graft, [facility]  #. ESRD MWF Schedule Patient seen in dialysis today Tolerating treatment well We will continue Monday Wednesday Friday schedule  #. Anemia of CKD  Lab Results  Component Value Date   HGB 8.8 (L) 11/05/2020  Will consider Epogen with dialysis treatments  #. Secondary hyperparathyroidism of renal origin N 25.81      Component Value Date/Time   PTH 270 (H) 10/03/2017 0949   Lab Results  Component Value Date   PHOS 3.4 11/01/2020  Patient is on sevelamer   #. Diabetes type 2 with CKD Hgb A1c MFr Bld (%)  Date Value  11/01/2020 7.2 (H)  On insulin aspart and insulin detemir  # Pneumonia Patient still requiring 8 L of O2 via trach poor Continues to be on antibiotics Management per primary team   LOS: 4 Sarahanne Novakowski 12/3/20212:11 PM  [place], [place]

## 2020-11-06 DIAGNOSIS — J9601 Acute respiratory failure with hypoxia: Secondary | ICD-10-CM | POA: Diagnosis not present

## 2020-11-06 LAB — GLUCOSE, CAPILLARY
Glucose-Capillary: 94 mg/dL (ref 70–99)
Glucose-Capillary: 140 mg/dL — ABNORMAL HIGH (ref 70–99)
Glucose-Capillary: 140 mg/dL — ABNORMAL HIGH (ref 70–99)
Glucose-Capillary: 192 mg/dL — ABNORMAL HIGH (ref 70–99)

## 2020-11-06 NOTE — TOC Progression Note (Signed)
Transition of Care [facility]) - Progression Note    Patient Details  Name: Deborah [name] MRN: [id number] Date of Birth: [birth year]  Transition of Care [facility]) CM/SW Contact  Izola Price, RN Phone Number: 11/06/2020, 2:14 PM  Clinical Narrative:    Contacted weekend [employer]) for home oxygen DME set up per provider. DME [employer] will be unable to set up for home/trach collar 3L/30% via trach collar use  until am 12/5. 13 home set up ahead of this patient. Information given to start process asap. Notified provider.Simmie Davies RN CM     Expected Discharge Plan: [place] Barriers to Discharge: Continued Medical Work up  Expected Discharge Plan and Services Expected Discharge Plan: [place] In-house Referral: NA   Post Acute Care Choice: [place] arrangements for the past 2 months: Single Family Home                           HH Arranged: NA           Social Determinants of Health (SDOH) Interventions    Readmission Risk Interventions Readmission Risk Prevention Plan 11/04/2020 04/02/2020 10/18/2019  Transportation Screening Complete Complete Complete  Medication Review [employer]) (No Data) Complete -  PCP or Specialist appointment within 3-5 days of discharge Complete Complete Complete  HRI or Home Care Consult (No Data) Complete -  SW Recovery Care/Counseling Consult Complete - -  Palliative Care Screening Not Applicable - -  [place] Not Applicable Complete Not Applicable  Some recent data might be hidden

## 2020-11-06 NOTE — Progress Notes (Signed)
SATURATION QUALIFICATIONS: (This note is used to comply with regulatory documentation for home oxygen)  Patient Saturations on Room Air at Rest = 94%  Patient Saturations on Room Air while Ambulating = 87%  Patient Saturations on 5 Liters of oxygen while Ambulating = 97%  Please briefly explain why patient needs home oxygen: Pt felt windy and SOB while ambulating on RA, she also desat to 73% after suctioning.

## 2020-11-06 NOTE — Progress Notes (Signed)
[facility], [place] 11/06/20  Subjective:   LOS: 5  Patient reports feeling better today, appears in no acute or respiratory distress.   Dialysis treatment yesterday, tolerated well.   Objective:  Vital signs in last 24 hours:  Temp:  [97.5 F (36.4 C)-99 F (37.2 C)] 98.1 F (36.7 C) (12/04 1148) Pulse Rate:  [71-81] 72 (12/04 1148) Resp:  [16-17] 17 (12/04 1148) BP: (147-185)/(63-92) 156/68 (12/04 1148) SpO2:  [93 %-100 %] 98 % (12/04 1148) FiO2 (%):  [28 %] 28 % (12/04 0809) Weight:  [63.2 kg] 63.2 kg (12/04 0600)  Weight change: -1.814 kg Filed Weights   11/04/20 0601 11/05/20 0426 11/06/20 0600  Weight: 64.1 kg 65 kg 63.2 kg    Intake/Output:    Intake/Output Summary (Last 24 hours) at 11/06/2020 1418 Last data filed at 11/06/2020 0601 Gross per 24 hour  Intake --  Output 0 ml  Net 0 ml    Physical Exam: General:  Sitting up in bed, appears pleasant and comfortable  HEENT  oral mucous membranes moist  Pulm/lungs  Respirations even and unlabored, lungs clear, supplemental O2 via trach collar  CVS/Heart  S1-S2, no rubs or gallops  Abdomen:   Soft, nontender, nondistended  Extremities:  No peripheral edema  Neurologic:  Oriented x3, speech clear and appropriate   Skin:  No acute or rashes  Left arm AVG+ bruit,+Thrill  Basic Metabolic Panel:  Recent Labs  Lab 11/01/20 0912 11/01/20 0912 11/02/20 0355 11/02/20 0355 11/03/20 0403 11/04/20 0357 11/05/20 0414  NA 138  --  138  --  139 137 135  K 4.8  --  3.5  --  4.2 3.9 4.2  CL 98  --  99  --  101 97* 97*  CO2 26  --  29  --  27 31 27   GLUCOSE 155*  --  99  --  157* 72 131*  BUN 72*  --  28*  --  56* 28* 49*  CREATININE 8.66*  --  4.77*  --  6.70* 4.89* 6.77*  CALCIUM 9.6   < > 8.8*   < > 8.8* 8.8* 9.0  PHOS 3.4  --   --   --   --   --   --    < > = values in this interval not displayed.     CBC: Recent Labs  Lab 11/01/20 0912 11/02/20 0355 11/03/20 0403  11/04/20 0357 11/05/20 0414  WBC 12.3* 8.9 9.1 8.2 8.4  NEUTROABS  --   --  6.2 5.6 5.5  HGB 10.8* 9.5* 9.5* 9.1* 8.8*  HCT 32.6* 27.5* 29.0* 27.7* 26.1*  MCV 84.7 82.6 84.8 84.5 84.2  PLT 209 177 176 171 185      Lab Results  Component Value Date   HEPBSAG NON REACTIVE 06/26/2020   HEPBSAB Non Reactive 08/17/2016   HEPBIGM Negative 08/26/2016      Microbiology:  Recent Results (from the past 240 hour(s))  Resp Panel by RT-PCR (Flu A&B, Covid) Nasopharyngeal Swab     Status: None   Collection Time: 11/01/20  9:22 AM   Specimen: Nasopharyngeal Swab; Nasopharyngeal(NP) swabs in vial transport medium  Result Value Ref Range Status   SARS Coronavirus 2 by RT PCR NEGATIVE NEGATIVE Final    Comment: (NOTE) SARS-CoV-2 target nucleic acids are NOT DETECTED.  The SARS-CoV-2 RNA is generally detectable in upper respiratory specimens during the acute phase of infection. The lowest concentration of SARS-CoV-2 viral copies this assay  can detect is 138 copies/mL. A negative result does not preclude SARS-Cov-2 infection and should not be used as the sole basis for treatment or other patient management decisions. A negative result may occur with  improper specimen collection/handling, submission of specimen other than nasopharyngeal swab, presence of viral mutation(s) within the areas targeted by this assay, and inadequate number of viral copies(<138 copies/mL). A negative result must be combined with clinical observations, patient history, and epidemiological information. The expected result is Negative.  Fact Sheet for Patients:  EntrepreneurPulse.com.au  Fact Sheet for Healthcare Providers:  IncredibleEmployment.be  This test is no t yet approved or cleared by the [place] FDA and  has been authorized for detection and/or diagnosis of SARS-CoV-2 by FDA under an Emergency Use Authorization (EUA). This EUA will remain  in effect (meaning  this test can be used) for the duration of the COVID-19 declaration under Section 564(b)(1) of the Act, 21 U.S.C.section 360bbb-3(b)(1), unless the authorization is terminated  or revoked sooner.       Influenza A by PCR NEGATIVE NEGATIVE Final   Influenza B by PCR NEGATIVE NEGATIVE Final    Comment: (NOTE) The Xpert Xpress SARS-CoV-2/FLU/RSV plus assay is intended as an aid in the diagnosis of influenza from Nasopharyngeal swab specimens and should not be used as a sole basis for treatment. Nasal washings and aspirates are unacceptable for Xpert Xpress SARS-CoV-2/FLU/RSV testing.  Fact Sheet for Patients: EntrepreneurPulse.com.au  Fact Sheet for Healthcare Providers: IncredibleEmployment.be  This test is not yet approved or cleared by the [place] FDA and has been authorized for detection and/or diagnosis of SARS-CoV-2 by FDA under an Emergency Use Authorization (EUA). This EUA will remain in effect (meaning this test can be used) for the duration of the COVID-19 declaration under Section 564(b)(1) of the Act, 21 U.S.C. section 360bbb-3(b)(1), unless the authorization is terminated or revoked.  Performed at [facility], [address] [place], [place] [address]   MRSA PCR Screening     Status: None   Collection Time: 11/02/20 12:00 AM   Specimen: Nasopharyngeal  Result Value Ref Range Status   MRSA by PCR NEGATIVE NEGATIVE Final    Comment:        The GeneXpert MRSA Assay (FDA approved for NASAL specimens only), is one component of a comprehensive MRSA colonization surveillance program. It is not intended to diagnose MRSA infection nor to guide or monitor treatment for MRSA infections. Performed at [facility], [address] [place], [place] [address]     Coagulation Studies: No results for input(s): LABPROT, INR in the last 72 hours.  Urinalysis: No results for input(s): COLORURINE, LABSPEC,  PHURINE, GLUCOSEU, HGBUR, BILIRUBINUR, KETONESUR, PROTEINUR, UROBILINOGEN, NITRITE, LEUKOCYTESUR in the last 72 hours.  Invalid input(s): APPERANCEUR    Imaging: No results found.   Medications:   . sodium chloride     . apixaban  2.5 mg Oral BID  . aspirin EC  81 mg Oral Daily  . atorvastatin  80 mg Oral q1800  . carvedilol  25 mg Oral BID  . Chlorhexidine Gluconate Cloth  6 each Topical Q0600  . [START ON 11/08/2020] epoetin (EPOGEN/PROCRIT) injection  10,000 Units Intravenous Q M,W,F-HD  . escitalopram  10 mg Oral Daily  . furosemide  40 mg Oral Once per day on Sun Tue Thu Sat  . gabapentin  300 mg Oral QHS  . insulin aspart  0-9 Units Subcutaneous TID WC  . insulin detemir  10 Units Subcutaneous QHS  .  irbesartan  150 mg Oral Daily  . midodrine  5 mg Oral Once per day on Mon Wed Fri  . multivitamin  1 tablet Oral Daily  . pantoprazole  40 mg Oral BID  . polyethylene glycol  17 g Oral Daily  . sevelamer carbonate  1,600 mg Oral TID WC  . sodium chloride flush  3 mL Intravenous Q12H  . traZODone  50 mg Oral Once per day on Mon Wed Fri   sodium chloride, acetaminophen, albuterol, sodium chloride flush  Assessment/ Plan:  64 y.o. female with  was admitted on 11/01/2020 for  Principal Problem:   Acute hypoxemic respiratory failure ([name]) Active Problems:   Hypertension   Diabetes mellitus type 2, controlled ([place])   Acute on chronic diastolic heart failure ([name])   ESRD (end stage renal disease) ([name])   Atrial flutter ([name])   Acute diastolic CHF (congestive heart failure) ([place])   Tracheostomy status ([place])   CAP (community acquired pneumonia)  Acute respiratory failure with hypoxia ([place]) [G25.63] Acute diastolic CHF (congestive heart failure) ([place]) [I50.31] Community acquired pneumonia, unspecified laterality [J18.9]   MWF, estimated dry weight 61.5 kg, left arm AV graft, [facility]  #. ESRD MWF Schedule MWF, estimated dry weight 61.5 kg, left arm AV  graft, [facility]  Patient received dialysis treatment yesterday Volume and electrolyte status acceptable No acute indication for additional dialysis treatment today We will continue MWF schedule  #. Anemia of CKD  Lab Results  Component Value Date   HGB 8.8 (L) 11/05/2020  Epogen 10,000 units Monday Wednesday Friday with dialysis treatments  #. Secondary hyperparathyroidism of renal origin N 25.81      Component Value Date/Time   PTH 270 (H) 10/03/2017 0949   Lab Results  Component Value Date   PHOS 3.4 11/01/2020  We will continue monitoring bone mineral metabolism parameters   #. Diabetes type 2 with CKD Hgb A1c MFr Bld (%)  Date Value  11/01/2020 7.2 (H)  Blood glucose readings within acceptable range Continue management per primary team  # Pneumonia Patient in no acute respiratory distress, continues to require supplemental O2 via trach collar Getting treated with ceftriaxone    LOS: 5 Eilidh Marcano 12/4/20212:18 PM  [place], [place]

## 2020-11-06 NOTE — Progress Notes (Signed)
PROGRESS NOTE    Deborah [name]  LHT:[id number] DOB: Dec 13, 1955 DOA: 11/01/2020 PCP: Leta Speller, MD   Brief Narrative:  45 black female HTN HFpEF MV replacement P A. 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  Layering fluid atelectasis?  Infection-EKG RVH  Administered sepsis secondary to pneumonia.  Started on ceftriaxone and Zithromax, completed a course while in the hospital.  Continue to require oxygen although improving.  Subjective: Patient was feeling better when seen today.  Stating that her secretions are improving.  Denies any shortness of breath.  She was willing to go home on home oxygen.  Assessment & Plan:   Principal Problem:   Acute hypoxemic respiratory failure ([name]) Active Problems:   Hypertension   Diabetes mellitus type 2, controlled ([name])   Acute on chronic diastolic heart failure ([name])   ESRD (end stage renal disease) ([name])   Atrial flutter ([name])   Acute diastolic CHF (congestive heart failure) ([place])   Tracheostomy status ([place])   CAP (community acquired pneumonia)  Sepsis secondary to pneumonia.POA.  Seems improving, although continue to require oxygen.  No oxygen use at baseline.  Procalcitonin at 0.37 but patient is also on dialysis.No significant concern for aspiration per speech evaluation. -Completed a course of ceftriaxone and Zithromax. -Wean oxygen as tolerated. -Ambulate patient to determine the home oxygen requirement, will need 2 to 3 L with 30% through trach collar-ordered but will not get delivery till tomorrow morning.  Chronic tracheostomy.  Since 2017, not a candidate for decannulation.  At baseline he does not use any oxygen at  home. -Try to wean oxygen. -We will ambulate patient again today after dialysis to determine the home oxygen requirement as patient might have to go home with oxygen.  ESRD.(MWF). Continue with her scheduled dialysis per nephrology. -Continue home meds which include Renvela and midodrine.  CAD.  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  No chest pain. -Continue home dose of aspirin, Avapro, Coreg and Lipitor.  Chronic atrial flutter s/p DCCV 2017.  No acute concern. -Continue home dose of Eliquis and Coreg.  HFpEF.  History of HFrEF which has been improved now.  Recent echocardiogram with normal EF, LVH and moderate pulmonary hypertension. -Volume is being managed by dialysis.  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 CBG within goal -Continue Levemir at a low dose of 10 units. -Continue with SSI  History of depression.  No acute concern. -Continue home dose of Lexapro and trazodone.  Objective: Vitals:   11/06/20 0410 11/06/20 0600 11/06/20 0802 11/06/20 1148  BP: (!) 152/69  (!) 185/92 (!) 156/68  Pulse: 71  75 72  Resp:   17 17  Temp: (!) 97.5 F (36.4 C)  98.2 F (36.8 C) 98.1 F (36.7 C)  TempSrc: Oral  Oral   SpO2: 93%  97% 98%  Weight:  63.2 kg    Height:        Intake/Output Summary (Last 24 hours) at 11/06/2020 1427 Last data filed at 11/06/2020 0601 Gross per 24 hour  Intake --  Output 0 ml  Net 0 ml   Filed Weights   11/04/20 0601 11/05/20 0426 11/06/20 0600  Weight: 64.1 kg 65 kg 63.2 kg    Examination:  General.  Well-developed lady with trach collar, in no acute distress. Pulmonary.  Lungs clear bilaterally, normal respiratory effort. CV.  Regular rate and rhythm, no JVD, rub or murmur. Abdomen.  Soft, nontender, nondistended, BS positive. CNS.  Alert and oriented x3.  No focal neurologic deficit. Extremities.  No edema, no cyanosis, pulses intact and symmetrical. Psychiatry.   Judgment and insight appears normal.   DVT prophylaxis: Heparin Code Status: Full Family Communication: Discussed with patient. Disposition Plan:  Status is: Inpatient  Remains inpatient appropriate because:Inpatient level of care appropriate due to severity of illness   Dispo: The patient is from: Home              Anticipated d/c is to: Home              Anticipated d/c date is: 1 day              Patient currently is medically stable.  Apparently home oxygen cannot be delivered today, most likely by tomorrow morning which delayed the discharge plan for today.  Consultants:   Nephrology  Procedures:  Antimicrobials:   Data Reviewed: I have personally reviewed following labs and imaging studies  CBC: Recent Labs  Lab 11/01/20 0912 11/02/20 0355 11/03/20 0403 11/04/20 0357 11/05/20 0414  WBC 12.3* 8.9 9.1 8.2 8.4  NEUTROABS  --   --  6.2 5.6 5.5  HGB 10.8* 9.5* 9.5* 9.1* 8.8*  HCT 32.6* 27.5* 29.0* 27.7* 26.1*  MCV 84.7 82.6 84.8 84.5 84.2  PLT 209 177 176 171 539   Basic Metabolic Panel: Recent Labs  Lab 11/01/20 0912 11/02/20 0355 11/03/20 0403 11/04/20 0357 11/05/20 0414  NA 138 138 139 137 135  K 4.8 3.5 4.2 3.9 4.2  CL 98 99 101 97* 97*  CO2 26 29 27 31 27   GLUCOSE 155* 99 157* 72 131*  BUN 72* 28* 56* 28* 49*  CREATININE 8.66* 4.77* 6.70* 4.89* 6.77*  CALCIUM 9.6 8.8* 8.8* 8.8* 9.0  PHOS 3.4  --   --   --   --    GFR: Estimated Creatinine Clearance: 7.4 mL/min (A) (by C-G formula based on SCr of 6.77 mg/dL (H)). Liver Function Tests: Recent Labs  Lab 11/03/20 0403 11/04/20 0357 11/05/20 0414  AST 18 18 16   ALT 25 23 19   ALKPHOS 61 65 60  BILITOT 1.1 1.3* 1.1  PROT 6.5 6.5 6.4*  ALBUMIN 3.3* 3.2* 3.1*   No results for input(s): LIPASE, AMYLASE in the last 168 hours. No results for input(s): AMMONIA in the last 168 hours. Coagulation Profile: No results for input(s): INR, PROTIME in the last 168 hours. Cardiac Enzymes: No results for  input(s): CKTOTAL, CKMB, CKMBINDEX, TROPONINI in the last 168 hours. BNP (last 3 results) No results for input(s): PROBNP in the last 8760 hours. HbA1C: No results for input(s): HGBA1C in the last 72 hours. CBG: Recent Labs  Lab 11/05/20 0751 11/05/20 1630 11/05/20 1959 11/06/20 0805 11/06/20 1148  GLUCAP 86 162* 240* 94 140*   Lipid Profile: No results for input(s): CHOL, HDL, LDLCALC, TRIG, CHOLHDL, LDLDIRECT in the last 72 hours. Thyroid Function Tests: No results for input(s): TSH, T4TOTAL, FREET4, T3FREE, THYROIDAB in the last 72 hours. Anemia Panel: No results for input(s): VITAMINB12, FOLATE, FERRITIN, TIBC, IRON, RETICCTPCT in the last 72 hours. Sepsis Labs: Recent Labs  Lab 11/02/20 0355 11/03/20 0403 11/04/20 0357  PROCALCITON 0.23 0.37 0.36  Recent Results (from the past 240 hour(s))  Resp Panel by RT-PCR (Flu A&B, Covid) Nasopharyngeal Swab     Status: None   Collection Time: 11/01/20  9:22 AM   Specimen: Nasopharyngeal Swab; Nasopharyngeal(NP) swabs in vial transport medium  Result Value Ref Range Status   SARS Coronavirus 2 by RT PCR NEGATIVE NEGATIVE Final    Comment: (NOTE) SARS-CoV-2 target nucleic acids are NOT DETECTED.  The SARS-CoV-2 RNA is generally detectable in upper respiratory specimens during the acute phase of infection. The lowest concentration of SARS-CoV-2 viral copies this assay can detect is 138 copies/mL. A negative result does not preclude SARS-Cov-2 infection and should not be used as the sole basis for treatment or other patient management decisions. A negative result may occur with  improper specimen collection/handling, submission of specimen other than nasopharyngeal swab, presence of viral mutation(s) within the areas targeted by this assay, and inadequate number of viral copies(<138 copies/mL). A negative result must be combined with clinical observations, patient history, and epidemiological information. The expected  result is Negative.  Fact Sheet for Patients:  EntrepreneurPulse.com.au  Fact Sheet for Healthcare Providers:  IncredibleEmployment.be  This test is no t yet approved or cleared by the [place] FDA and  has been authorized for detection and/or diagnosis of SARS-CoV-2 by FDA under an Emergency Use Authorization (EUA). This EUA will remain  in effect (meaning this test can be used) for the duration of the COVID-19 declaration under Section 564(b)(1) of the Act, 21 U.S.C.section 360bbb-3(b)(1), unless the authorization is terminated  or revoked sooner.       Influenza A by PCR NEGATIVE NEGATIVE Final   Influenza B by PCR NEGATIVE NEGATIVE Final    Comment: (NOTE) The Xpert Xpress SARS-CoV-2/FLU/RSV plus assay is intended as an aid in the diagnosis of influenza from Nasopharyngeal swab specimens and should not be used as a sole basis for treatment. Nasal washings and aspirates are unacceptable for Xpert Xpress SARS-CoV-2/FLU/RSV testing.  Fact Sheet for Patients: EntrepreneurPulse.com.au  Fact Sheet for Healthcare Providers: IncredibleEmployment.be  This test is not yet approved or cleared by the [place] FDA and has been authorized for detection and/or diagnosis of SARS-CoV-2 by FDA under an Emergency Use Authorization (EUA). This EUA will remain in effect (meaning this test can be used) for the duration of the COVID-19 declaration under Section 564(b)(1) of the Act, 21 U.S.C. section 360bbb-3(b)(1), unless the authorization is terminated or revoked.  Performed at [facility], [address] [place], [place] [address]   MRSA PCR Screening     Status: None   Collection Time: 11/02/20 12:00 AM   Specimen: Nasopharyngeal  Result Value Ref Range Status   MRSA by PCR NEGATIVE NEGATIVE Final    Comment:        The GeneXpert MRSA Assay (FDA approved for NASAL specimens only), is one  component of a comprehensive MRSA colonization surveillance program. It is not intended to diagnose MRSA infection nor to guide or monitor treatment for MRSA infections. Performed at [facility], [address], [place], [place] [address]      Radiology Studies: No results found.  Scheduled Meds: . apixaban  2.5 mg Oral BID  . aspirin EC  81 mg Oral Daily  . atorvastatin  80 mg Oral q1800  . carvedilol  25 mg Oral BID  . Chlorhexidine Gluconate Cloth  6 each Topical Q0600  . [START ON 11/08/2020] epoetin (EPOGEN/PROCRIT) injection  10,000 Units Intravenous Q M,W,F-HD  . escitalopram  10 mg Oral Daily  . furosemide  40 mg Oral Once per day on Sun Tue Thu Sat  . gabapentin  300 mg Oral QHS  . insulin aspart  0-9 Units Subcutaneous TID WC  . insulin detemir  10 Units Subcutaneous QHS  . irbesartan  150 mg Oral Daily  . midodrine  5 mg Oral Once per day on Mon Wed Fri  . multivitamin  1 tablet Oral Daily  . pantoprazole  40 mg Oral BID  . polyethylene glycol  17 g Oral Daily  . sevelamer carbonate  1,600 mg Oral TID WC  . sodium chloride flush  3 mL Intravenous Q12H  . traZODone  50 mg Oral Once per day on Mon Wed Fri   Continuous Infusions: . sodium chloride       LOS: 5 days   Time spent: 25 minutes.  Lorella Nimrod, MD [facility]  If 7PM-7AM, please contact night-coverage [email or web address]  11/06/2020, 2:27 PM   This record has been created using Systems analyst. Errors have been sought and corrected,but may not always be located. Such creation errors do not reflect on the standard of care.

## 2020-11-07 DIAGNOSIS — J9601 Acute respiratory failure with hypoxia: Secondary | ICD-10-CM | POA: Diagnosis not present

## 2020-11-07 LAB — GLUCOSE, CAPILLARY
Glucose-Capillary: 85 mg/dL (ref 70–99)
Glucose-Capillary: 134 mg/dL — ABNORMAL HIGH (ref 70–99)
Glucose-Capillary: 101 mg/dL — ABNORMAL HIGH (ref 70–99)
Glucose-Capillary: 193 mg/dL — ABNORMAL HIGH (ref 70–99)

## 2020-11-07 NOTE — Progress Notes (Signed)
SATURATION QUALIFICATIONS: (This note is used to comply with regulatory documentation for home oxygen)  Patient Saturations on Room Air at Rest = 97%  Patient Saturations on Room Air while Ambulating = 82%  Patient Saturations on 5 Liters of oxygen while Ambulating = 96%   Patient unable to maintain adequate oxygenation ambulating on room air.

## 2020-11-07 NOTE — Plan of Care (Signed)

## 2020-11-07 NOTE — TOC Progression Note (Signed)
Transition of Care [facility]) - Progression Note    Patient Details  Name: Deborah [name] MRN: [id number] Date of Birth: 1956/10/10  Transition of Care [facility]) CM/SW Contact  Izola Price, RN Phone Number: 11/07/2020, 11:44 AM  Clinical Narrative:   Re-contacted [facility] just now for update on DME oxygen transport and home set up. Discharge was delayed yesterday due to oxygen agency unable to make delivery that day due to too many deliveries already in process.   On call person, Freda Munro, indicated patient was not in system despite speaking with another weekend on call representative on 12/4 and reassured it would be set up first thing this am 12/5.   Information given to Freda Munro at [facility] and she indicated she would expedite and notify CM of an ETA for home set up. Son's number given for someone to be present at home.  Transport oxygen DME also to be delivered to room.   Will continue to monitor delivery and home set up.  Simmie Davies RN CM    Expected Discharge Plan: [place] Barriers to Discharge: Continued Medical Work up  Expected Discharge Plan and Services Expected Discharge Plan: [place] In-house Referral: NA   Post Acute Care Choice: [place] arrangements for the past 2 months: Single Family Home                           HH Arranged: NA           Social Determinants of Health (SDOH) Interventions    Readmission Risk Interventions Readmission Risk Prevention Plan 11/04/2020 04/02/2020 10/18/2019  Transportation Screening Complete Complete Complete  Medication Review Press photographer) (No Data) Complete -  PCP or Specialist appointment within 3-5 days of discharge Complete Complete Complete  HRI or Home Care Consult (No Data) Complete -  SW Recovery Care/Counseling Consult Complete - -  Palliative Care Screening Not Applicable - -  [place] Not Applicable Complete Not Applicable  Some recent data  might be hidden

## 2020-11-07 NOTE — Progress Notes (Signed)
Patient seen earlier in shift in no distress. Remains on trach collar. Deferred trach care as had been done by family member. Will continue to monitor.

## 2020-11-07 NOTE — Progress Notes (Signed)
[facility], [place] 11/07/20  Subjective:   LOS: 6  Patient resting in bed, in no acute distress, reports dyspnea with exertion. She continues to require 5L O2 via trach collar. Received dialysis treatment on Monday.We will plan dialysis again tomorrow, if patient is still hospitalized.   Objective:  Vital signs in last 24 hours:  Temp:  [98.2 F (36.8 C)-98.9 F (37.2 C)] 98.6 F (37 C) (12/05 1600) Pulse Rate:  [67-76] 76 (12/05 1600) Resp:  [16-17] 17 (12/05 1600) BP: (156-179)/(68-86) 179/86 (12/05 1600) SpO2:  [91 %-100 %] 91 % (12/05 1600) FiO2 (%):  [28 %] 28 % (12/04 2223) Weight:  [64.1 kg] 64.1 kg (12/05 0345)  Weight change: 0.907 kg Filed Weights   11/05/20 0426 11/06/20 0600 11/07/20 0345  Weight: 65 kg 63.2 kg 64.1 kg    Intake/Output:    Intake/Output Summary (Last 24 hours) at 11/07/2020 1642 Last data filed at 11/06/2020 2125 Gross per 24 hour  Intake 240 ml  Output 0 ml  Net 240 ml    Physical Exam: General:  In no acute distress   HEENT  Normocephalic,atraumatic  Pulm/lungs  lungs clear, supplemental 5L  O2 via trach collar  CVS/Heart  Regular rate and rhythm  Abdomen:   Soft, nontender, nondistended  Extremities:  No peripheral edema  Neurologic: Awake,alert, oriented   Skin:  No acute or rashes  Left arm AVG+ bruit,+[place]  Basic Metabolic Panel:  Recent Labs  Lab 11/01/20 0912 11/01/20 0912 11/02/20 0355 11/02/20 0355 11/03/20 0403 11/04/20 0357 11/05/20 0414  NA 138  --  138  --  139 137 135  K 4.8  --  3.5  --  4.2 3.9 4.2  CL 98  --  99  --  101 97* 97*  CO2 26  --  29  --  27 31 27   GLUCOSE 155*  --  99  --  157* 72 131*  BUN 72*  --  28*  --  56* 28* 49*  CREATININE 8.66*  --  4.77*  --  6.70* 4.89* 6.77*  CALCIUM 9.6   < > 8.8*   < > 8.8* 8.8* 9.0  PHOS 3.4  --   --   --   --   --   --    < > = values in this interval not displayed.     CBC: Recent Labs  Lab 11/01/20 0912  11/02/20 0355 11/03/20 0403 11/04/20 0357 11/05/20 0414  WBC 12.3* 8.9 9.1 8.2 8.4  NEUTROABS  --   --  6.2 5.6 5.5  HGB 10.8* 9.5* 9.5* 9.1* 8.8*  HCT 32.6* 27.5* 29.0* 27.7* 26.1*  MCV 84.7 82.6 84.8 84.5 84.2  PLT 209 177 176 171 185      Lab Results  Component Value Date   HEPBSAG NON REACTIVE 06/26/2020   HEPBSAB Non Reactive 08/17/2016   HEPBIGM Negative 08/26/2016      Microbiology:  Recent Results (from the past 240 hour(s))  Resp Panel by RT-PCR (Flu A&B, Covid) Nasopharyngeal Swab     Status: None   Collection Time: 11/01/20  9:22 AM   Specimen: Nasopharyngeal Swab; Nasopharyngeal(NP) swabs in vial transport medium  Result Value Ref Range Status   SARS Coronavirus 2 by RT PCR NEGATIVE NEGATIVE Final    Comment: (NOTE) SARS-CoV-2 target nucleic acids are NOT DETECTED.  The SARS-CoV-2 RNA is generally detectable in upper respiratory specimens during the acute phase of infection. The lowest concentration of SARS-CoV-2  viral copies this assay can detect is 138 copies/mL. A negative result does not preclude SARS-Cov-2 infection and should not be used as the sole basis for treatment or other patient management decisions. A negative result may occur with  improper specimen collection/handling, submission of specimen other than nasopharyngeal swab, presence of viral mutation(s) within the areas targeted by this assay, and inadequate number of viral copies(<138 copies/mL). A negative result must be combined with clinical observations, patient history, and epidemiological information. The expected result is Negative.  Fact Sheet for Patients:  EntrepreneurPulse.com.au  Fact Sheet for Healthcare Providers:  IncredibleEmployment.be  This test is no t yet approved or cleared by the [place] FDA and  has been authorized for detection and/or diagnosis of SARS-CoV-2 by FDA under an Emergency Use Authorization (EUA). This EUA  will remain  in effect (meaning this test can be used) for the duration of the COVID-19 declaration under Section 564(b)(1) of the Act, 21 U.S.C.section 360bbb-3(b)(1), unless the authorization is terminated  or revoked sooner.       Influenza A by PCR NEGATIVE NEGATIVE Final   Influenza B by PCR NEGATIVE NEGATIVE Final    Comment: (NOTE) The Xpert Xpress SARS-CoV-2/FLU/RSV plus assay is intended as an aid in the diagnosis of influenza from Nasopharyngeal swab specimens and should not be used as a sole basis for treatment. Nasal washings and aspirates are unacceptable for Xpert Xpress SARS-CoV-2/FLU/RSV testing.  Fact Sheet for Patients: EntrepreneurPulse.com.au  Fact Sheet for Healthcare Providers: IncredibleEmployment.be  This test is not yet approved or cleared by the [place] FDA and has been authorized for detection and/or diagnosis of SARS-CoV-2 by FDA under an Emergency Use Authorization (EUA). This EUA will remain in effect (meaning this test can be used) for the duration of the COVID-19 declaration under Section 564(b)(1) of the Act, 21 U.S.C. section 360bbb-3(b)(1), unless the authorization is terminated or revoked.  Performed at [facility], [address] [place], [place] [address]   MRSA PCR Screening     Status: None   Collection Time: 11/02/20 12:00 AM   Specimen: Nasopharyngeal  Result Value Ref Range Status   MRSA by PCR NEGATIVE NEGATIVE Final    Comment:        The GeneXpert MRSA Assay (FDA approved for NASAL specimens only), is one component of a comprehensive MRSA colonization surveillance program. It is not intended to diagnose MRSA infection nor to guide or monitor treatment for MRSA infections. Performed at [facility], [address] [place], [place] [address]     Coagulation Studies: No results for input(s): LABPROT, INR in the last 72 hours.  Urinalysis: No results for  input(s): COLORURINE, LABSPEC, PHURINE, GLUCOSEU, HGBUR, BILIRUBINUR, KETONESUR, PROTEINUR, UROBILINOGEN, NITRITE, LEUKOCYTESUR in the last 72 hours.  Invalid input(s): APPERANCEUR    Imaging: No results found.   Medications:   . sodium chloride     . apixaban  2.5 mg Oral BID  . aspirin EC  81 mg Oral Daily  . atorvastatin  80 mg Oral q1800  . carvedilol  25 mg Oral BID  . Chlorhexidine Gluconate Cloth  6 each Topical Q0600  . [START ON 11/08/2020] epoetin (EPOGEN/PROCRIT) injection  10,000 Units Intravenous Q M,W,F-HD  . escitalopram  10 mg Oral Daily  . furosemide  40 mg Oral Once per day on Sun Tue Thu Sat  . gabapentin  300 mg Oral QHS  . insulin aspart  0-9 Units Subcutaneous TID WC  . insulin detemir  10  Units Subcutaneous QHS  . irbesartan  150 mg Oral Daily  . midodrine  5 mg Oral Once per day on Mon Wed Fri  . multivitamin  1 tablet Oral Daily  . pantoprazole  40 mg Oral BID  . polyethylene glycol  17 g Oral Daily  . sevelamer carbonate  1,600 mg Oral TID WC  . sodium chloride flush  3 mL Intravenous Q12H  . traZODone  50 mg Oral Once per day on Mon Wed Fri   sodium chloride, acetaminophen, albuterol, sodium chloride flush  Assessment/ Plan:  64 y.o. female with  was admitted on 11/01/2020 for  Principal Problem:   Acute hypoxemic respiratory failure ([name]) Active Problems:   Hypertension   Diabetes mellitus type 2, controlled ([place])   Acute on chronic diastolic heart failure ([name])   ESRD (end stage renal disease) ([name])   Atrial flutter ([name])   Acute diastolic CHF (congestive heart failure) ([place])   Tracheostomy status ([place])   CAP (community acquired pneumonia)  Acute respiratory failure with hypoxia ([place]) [T26.71] Acute diastolic CHF (congestive heart failure) ([place]) [I50.31] Community acquired pneumonia, unspecified laterality [J18.9]   MWF, estimated dry weight 61.5 kg, left arm AV graft, [facility]  #. ESRD MWF Schedule MWF, estimated  dry weight 61.5 kg, left arm AV graft, [facility]  Volume and electrolyte status acceptable Will dialyze tomorrow if patient is still hospitalized  #. Anemia of CKD  Lab Results  Component Value Date   HGB 8.8 (L) 11/05/2020  Epogen 10,000 units Monday Wednesday Friday with dialysis treatments  #. Secondary hyperparathyroidism of renal origin N 25.81      Component Value Date/Time   PTH 270 (H) 10/03/2017 0949   Lab Results  Component Value Date   PHOS 3.4 11/01/2020  On Sevelamer   #. Diabetes type 2 with CKD Hgb A1c MFr Bld (%)  Date Value  11/01/2020 7.2 (H)  On Insulin Aspart and Insulin detemir  # Pneumonia Reports dyspnea on exertion Currently on 5L supplemental O2 via trach collar   LOS: [place] 12/5/20214:42 PM  [place], [place]

## 2020-11-07 NOTE — Progress Notes (Signed)
PROGRESS NOTE    Deborah [name]  WIO:[id number] DOB: 11-15-1956 DOA: 11/01/2020 PCP: Leta Speller, MD   Brief Narrative:  49 black female HTN HFpEF MV replacement P A. 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  Layering fluid atelectasis?  Infection-EKG RVH  Administered sepsis secondary to pneumonia.  Started on ceftriaxone and Zithromax, completed a course while in the hospital.  Continue to require oxygen although improving.  Subjective: Patient has no new complaint today.  Still some shortness of breath.  Oxygen delivery still pending.  Requesting discharge tomorrow after getting dialysis.  Assessment & Plan:   Principal Problem:   Acute hypoxemic respiratory failure ([name]) Active Problems:   Hypertension   Diabetes mellitus type 2, controlled ([name])   Acute on chronic diastolic heart failure ([name])   ESRD (end stage renal disease) ([name])   Atrial flutter ([name])   Acute diastolic CHF (congestive heart failure) ([place])   Tracheostomy status ([place])   CAP (community acquired pneumonia)  Sepsis secondary to pneumonia.POA.  Seems improving, although continue to require oxygen.  No oxygen use at baseline.  Procalcitonin at 0.37 but patient is also on dialysis.No significant concern for aspiration per speech evaluation. -Completed a course of ceftriaxone and Zithromax. -Wean oxygen as tolerated. -Ambulate patient to determine the home oxygen requirement, will need  3 L with 30% through trach collar-ordered but will not get delivery till tomorrow morning.  Chronic tracheostomy.  Since 2017, not a candidate for decannulation.  At baseline he does not use any oxygen at home. -Try to  wean oxygen as tolerated.  Continue to need at this time.  ESRD.(MWF). Continue with her scheduled dialysis per nephrology. -Continue home meds which include Renvela and midodrine.  CAD.  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  No chest pain. -Continue home dose of aspirin, Avapro, Coreg and Lipitor.  Chronic atrial flutter s/p DCCV 2017.  No acute concern. -Continue home dose of Eliquis and Coreg.  HFpEF.  History of HFrEF which has been improved now.  Recent echocardiogram with normal EF, LVH and moderate pulmonary hypertension. -Volume is being managed by dialysis.  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 CBG within goal -Continue Levemir at a low dose of 10 units. -Continue with SSI  History of depression.  No acute concern. -Continue home dose of Lexapro and trazodone.  Objective: Vitals:   11/06/20 2223 11/07/20 0345 11/07/20 0750 11/07/20 1145  BP:  (!) 157/68 (!) 156/69 (!) 174/68  Pulse:  70 67 72  Resp:  16 17 17   Temp:  98.9 F (37.2 C) 98.3 F (36.8 C) 98.2 F (36.8 C)  TempSrc:  Oral    SpO2: 96% 97% 100% 98%  Weight:  64.1 kg    Height:        Intake/Output Summary (Last 24 hours) at 11/07/2020 1329 Last data filed at 11/06/2020 2125 Gross per 24 hour  Intake 240 ml  Output 0 ml  Net 240 ml   Filed Weights   11/05/20 0426 11/06/20 0600 11/07/20 0345  Weight: 65 kg 63.2 kg 64.1 kg    Examination:  General.  Well-developed lady, in no acute distress. Pulmonary.  Lungs clear bilaterally, normal respiratory effort. CV.  Regular rate and rhythm, no JVD, rub or murmur. Abdomen.  Soft, nontender, nondistended, BS positive. CNS.  Alert and oriented x3.  No focal neurologic deficit. Extremities.  No edema, no cyanosis, pulses intact and symmetrical. Psychiatry.  Judgment and insight appears normal.  DVT prophylaxis: Heparin Code Status: Full Family Communication: Discussed with  patient. Disposition Plan:  Status is: Inpatient  Remains inpatient appropriate because:Inpatient level of care appropriate due to severity of illness   Dispo: The patient is from: Home              Anticipated d/c is to: Home              Anticipated d/c date is: 1 day              Patient currently is medically stable.  Apparently home oxygen cannot be delivered today, most likely by tomorrow morning which delayed the discharge plan for today.  Patient wants to leave tomorrow after getting dialysis now, still no oxygen delivery.  Consultants:   Nephrology  Procedures:  Antimicrobials:   Data Reviewed: I have personally reviewed following labs and imaging studies  CBC: Recent Labs  Lab 11/01/20 0912 11/02/20 0355 11/03/20 0403 11/04/20 0357 11/05/20 0414  WBC 12.3* 8.9 9.1 8.2 8.4  NEUTROABS  --   --  6.2 5.6 5.5  HGB 10.8* 9.5* 9.5* 9.1* 8.8*  HCT 32.6* 27.5* 29.0* 27.7* 26.1*  MCV 84.7 82.6 84.8 84.5 84.2  PLT 209 177 176 171 127   Basic Metabolic Panel: Recent Labs  Lab 11/01/20 0912 11/02/20 0355 11/03/20 0403 11/04/20 0357 11/05/20 0414  NA 138 138 139 137 135  K 4.8 3.5 4.2 3.9 4.2  CL 98 99 101 97* 97*  CO2 26 29 27 31 27   GLUCOSE 155* 99 157* 72 131*  BUN 72* 28* 56* 28* 49*  CREATININE 8.66* 4.77* 6.70* 4.89* 6.77*  CALCIUM 9.6 8.8* 8.8* 8.8* 9.0  PHOS 3.4  --   --   --   --    GFR: Estimated Creatinine Clearance: 7.5 mL/min (A) (by C-G formula based on SCr of 6.77 mg/dL (H)). Liver Function Tests: Recent Labs  Lab 11/03/20 0403 11/04/20 0357 11/05/20 0414  AST 18 18 16   ALT 25 23 19   ALKPHOS 61 65 60  BILITOT 1.1 1.3* 1.1  PROT 6.5 6.5 6.4*  ALBUMIN 3.3* 3.2* 3.1*   No results for input(s): LIPASE, AMYLASE in the last 168 hours. No results for input(s): AMMONIA in the last 168 hours. Coagulation Profile: No results for input(s): INR, PROTIME in the last 168 hours. Cardiac Enzymes: No results for input(s): CKTOTAL, CKMB, CKMBINDEX,  TROPONINI in the last 168 hours. BNP (last 3 results) No results for input(s): PROBNP in the last 8760 hours. HbA1C: No results for input(s): HGBA1C in the last 72 hours. CBG: Recent Labs  Lab 11/06/20 1148 11/06/20 1626 11/06/20 2027 11/07/20 0751 11/07/20 1145  GLUCAP 140* 140* 192* 85 134*   Lipid Profile: No results for input(s): CHOL, HDL, LDLCALC, TRIG, CHOLHDL, LDLDIRECT in the last 72 hours. Thyroid Function Tests: No results for input(s): TSH, T4TOTAL, FREET4, T3FREE, THYROIDAB in the last 72 hours. Anemia Panel: No results for input(s): VITAMINB12, FOLATE, FERRITIN, TIBC, IRON, RETICCTPCT in the last 72 hours. Sepsis Labs: Recent Labs  Lab 11/02/20 0355 11/03/20 0403 11/04/20 0357  PROCALCITON 0.23 0.37 0.36    Recent Results (from the past 240 hour(s))  Resp Panel  by RT-PCR (Flu A&B, Covid) Nasopharyngeal Swab     Status: None   Collection Time: 11/01/20  9:22 AM   Specimen: Nasopharyngeal Swab; Nasopharyngeal(NP) swabs in vial transport medium  Result Value Ref Range Status   SARS Coronavirus 2 by RT PCR NEGATIVE NEGATIVE Final    Comment: (NOTE) SARS-CoV-2 target nucleic acids are NOT DETECTED.  The SARS-CoV-2 RNA is generally detectable in upper respiratory specimens during the acute phase of infection. The lowest concentration of SARS-CoV-2 viral copies this assay can detect is 138 copies/mL. A negative result does not preclude SARS-Cov-2 infection and should not be used as the sole basis for treatment or other patient management decisions. A negative result may occur with  improper specimen collection/handling, submission of specimen other than nasopharyngeal swab, presence of viral mutation(s) within the areas targeted by this assay, and inadequate number of viral copies(<138 copies/mL). A negative result must be combined with clinical observations, patient history, and epidemiological information. The expected result is Negative.  Fact Sheet for  Patients:  EntrepreneurPulse.com.au  Fact Sheet for Healthcare Providers:  IncredibleEmployment.be  This test is no t yet approved or cleared by the [place] FDA and  has been authorized for detection and/or diagnosis of SARS-CoV-2 by FDA under an Emergency Use Authorization (EUA). This EUA will remain  in effect (meaning this test can be used) for the duration of the COVID-19 declaration under Section 564(b)(1) of the Act, 21 U.S.C.section 360bbb-3(b)(1), unless the authorization is terminated  or revoked sooner.       Influenza A by PCR NEGATIVE NEGATIVE Final   Influenza B by PCR NEGATIVE NEGATIVE Final    Comment: (NOTE) The Xpert Xpress SARS-CoV-2/FLU/RSV plus assay is intended as an aid in the diagnosis of influenza from Nasopharyngeal swab specimens and should not be used as a sole basis for treatment. Nasal washings and aspirates are unacceptable for Xpert Xpress SARS-CoV-2/FLU/RSV testing.  Fact Sheet for Patients: EntrepreneurPulse.com.au  Fact Sheet for Healthcare Providers: IncredibleEmployment.be  This test is not yet approved or cleared by the [place] FDA and has been authorized for detection and/or diagnosis of SARS-CoV-2 by FDA under an Emergency Use Authorization (EUA). This EUA will remain in effect (meaning this test can be used) for the duration of the COVID-19 declaration under Section 564(b)(1) of the Act, 21 U.S.C. section 360bbb-3(b)(1), unless the authorization is terminated or revoked.  Performed at [facility], [address] [place], [place] [address]   MRSA PCR Screening     Status: None   Collection Time: 11/02/20 12:00 AM   Specimen: Nasopharyngeal  Result Value Ref Range Status   MRSA by PCR NEGATIVE NEGATIVE Final    Comment:        The GeneXpert MRSA Assay (FDA approved for NASAL specimens only), is one component of a comprehensive MRSA  colonization surveillance program. It is not intended to diagnose MRSA infection nor to guide or monitor treatment for MRSA infections. Performed at [facility], [address] [place], [place] [address]      Radiology Studies: No results found.  Scheduled Meds: . apixaban  2.5 mg Oral BID  . aspirin EC  81 mg Oral Daily  . atorvastatin  80 mg Oral q1800  . carvedilol  25 mg Oral BID  . Chlorhexidine Gluconate Cloth  6 each Topical Q0600  . [START ON 11/08/2020] epoetin (EPOGEN/PROCRIT) injection  10,000 Units Intravenous Q M,W,F-HD  . escitalopram  10 mg Oral Daily  . furosemide  40  mg Oral Once per day on Sun Tue Thu Sat  . gabapentin  300 mg Oral QHS  . insulin aspart  0-9 Units Subcutaneous TID WC  . insulin detemir  10 Units Subcutaneous QHS  . irbesartan  150 mg Oral Daily  . midodrine  5 mg Oral Once per day on Mon Wed Fri  . multivitamin  1 tablet Oral Daily  . pantoprazole  40 mg Oral BID  . polyethylene glycol  17 g Oral Daily  . sevelamer carbonate  1,600 mg Oral TID WC  . sodium chloride flush  3 mL Intravenous Q12H  . traZODone  50 mg Oral Once per day on Mon Wed Fri   Continuous Infusions: . sodium chloride       LOS: 6 days   Time spent: 25 minutes.  Lorella Nimrod, MD [facility]  If 7PM-7AM, please contact night-coverage [email or web address]  11/07/2020, 1:29 PM   This record has been created using Systems analyst. Errors have been sought and corrected,but may not always be located. Such creation errors do not reflect on the standard of care.

## 2020-11-07 NOTE — Progress Notes (Signed)
Mobility Specialist - Progress Note   11/07/20 1700  Mobility  Activity Ambulated in hall;Ambulated to bathroom  Level of Assistance Contact guard assist, steadying assist  Assistive Device Cane  Distance Ambulated (ft) 180 ft  Mobility Response Tolerated well  Mobility performed by Mobility specialist  $Mobility charge 1 Mobility    Pre-mobility: 75 HR, 96% SpO2 During mobility: 83 HR, 82% SpO2 Post-mobility: 76 HR, 95% SpO2   Pt was lying in bed upon arrival utilizing 5L via trach collar. Pt agreed to session. Pt's son present throughout session. Pt able to get EOB independently. Pt ambulated to bathroom prior to ambulation in hallway. Upon pt's return from restroom, O2 desat to 82% while on RA. Trach collar reapplied during seated rest break and O2 immediately sat up to 96%. Pt's son voiced concerns about new DME not being suitable for pt's needs, RT notified and entered room to review issues. Mobility continued with session as pt progressed to ambulation in hallway. Pt began ambulating on RA, using SPC and CGA. Noted mild LOB several times at increased speed. Pt instructed to slow and steady pace, correcting issue momentarily. Pt ambulated ~120 before O2 desat to 81%. Trach collar reapplied on 4L via tank, again increasing sats to high 90s almost immediately. Pt continued ambulation towards room and sat back EOB. Pt began to voice concerns about being d/c tomorrow, stating she does not want to leave until safe to do so. Pt also stated that she believes she had gotten sick from the dialysis center in which she attends and does not feel safe to return. Nurse notified of performance and concerns. Overall, pt tolerated session well. Pt was left in bed with all needs in reach.    Kathee Delton Mobility Specialist 11/07/20, 5:37 PM

## 2020-11-07 NOTE — Progress Notes (Signed)
Patient reports she feels unsafe at current dialysis center. Case management notified and reaching out to CM on Monday for possible change of location for patient.

## 2020-11-08 DIAGNOSIS — J9601 Acute respiratory failure with hypoxia: Secondary | ICD-10-CM | POA: Diagnosis not present

## 2020-11-08 LAB — CBC
HCT: 29.1 % — ABNORMAL LOW (ref 36.0–46.0)
Hemoglobin: 9.5 g/dL — ABNORMAL LOW (ref 12.0–15.0)
MCH: 27.8 pg (ref 26.0–34.0)
MCHC: 32.6 g/dL (ref 30.0–36.0)
MCV: 85.1 fL (ref 80.0–100.0)
Platelets: 214 10*3/uL (ref 150–400)
RBC: 3.42 MIL/uL — ABNORMAL LOW (ref 3.87–5.11)
RDW: 17 % — ABNORMAL HIGH (ref 11.5–15.5)
WBC: 8.8 10*3/uL (ref 4.0–10.5)
nRBC: 0 % (ref 0.0–0.2)

## 2020-11-08 LAB — BASIC METABOLIC PANEL
Anion gap: 12 (ref 5–15)
BUN: 73 mg/dL — ABNORMAL HIGH (ref 8–23)
CO2: 24 mmol/L (ref 22–32)
Calcium: 9.2 mg/dL (ref 8.9–10.3)
Chloride: 99 mmol/L (ref 98–111)
Creatinine, Ser: 8.89 mg/dL — ABNORMAL HIGH (ref 0.44–1.00)
GFR, Estimated: 5 mL/min — ABNORMAL LOW (ref >60)
Glucose, Bld: 134 mg/dL — ABNORMAL HIGH (ref 70–99)
Potassium: 5.3 mmol/L — ABNORMAL HIGH (ref 3.5–5.1)
Sodium: 135 mmol/L (ref 135–145)

## 2020-11-08 LAB — PHOSPHORUS: Phosphorus: 4.4 mg/dL (ref 2.5–4.6)

## 2020-11-08 LAB — GLUCOSE, CAPILLARY
Glucose-Capillary: 119 mg/dL — ABNORMAL HIGH (ref 70–99)
Glucose-Capillary: 140 mg/dL — ABNORMAL HIGH (ref 70–99)
Glucose-Capillary: 130 mg/dL — ABNORMAL HIGH (ref 70–99)
Glucose-Capillary: 176 mg/dL — ABNORMAL HIGH (ref 70–99)

## 2020-11-08 NOTE — Care Management Important Message (Signed)
Important Message  Patient Details  Name: Deborah [name] MRN: [id number] Date of Birth: 27-May-1956   Medicare Important Message Given:  Yes     Dannette Barbara 11/08/2020, 1:29 PM

## 2020-11-08 NOTE — Progress Notes (Signed)
[facility], [place] 11/08/20  Subjective:   LOS: 7  Patient standing up near bed, reports SOB with exertion, otherwise no complaints.  We are planning for dialysis today.   Objective:  Vital signs in last 24 hours:  Temp:  [97.7 F (36.5 C)-98 F (36.7 C)] 97.7 F (36.5 C) (12/06 1122) Pulse Rate:  [66-72] 69 (12/06 1122) Resp:  [18] 18 (12/06 1122) BP: (134-179)/(55-84) 179/84 (12/06 1122) SpO2:  [93 %-100 %] 93 % (12/06 1122) Weight:  [65.2 kg] 65.2 kg (12/06 0415)  Weight change: 1.089 kg Filed Weights   11/06/20 0600 11/07/20 0345 11/08/20 0415  Weight: 63.2 kg 64.1 kg 65.2 kg    Intake/Output:    Intake/Output Summary (Last 24 hours) at 11/08/2020 1630 Last data filed at 11/08/2020 1349 Gross per 24 hour  Intake 360 ml  Output 0 ml  Net 360 ml    Physical Exam: General:  In no acute distress   HEENT  Normocephalic,atraumatic  Pulm/lungs  dyspnea on exertion, lungs clear  CVS/Heart  S1S2, no rubs or gallops  Abdomen:   Soft, nontender, nondistended  Extremities:  No peripheral edema  Neurologic: Oriented x 3   Skin:  No acute lesions or rashes  Left arm AVG+ bruit,+Thrill  Basic Metabolic Panel:  Recent Labs  Lab 11/02/20 0355 11/02/20 0355 11/03/20 0403 11/03/20 0403 11/04/20 0357 11/05/20 0414 11/08/20 0907  NA 138  --  139  --  137 135 135  K 3.5  --  4.2  --  3.9 4.2 5.3*  CL 99  --  101  --  97* 97* 99  CO2 29  --  27  --  31 27 24   GLUCOSE 99  --  157*  --  72 131* 134*  BUN 28*  --  56*  --  28* 49* 73*  CREATININE 4.77*  --  6.70*  --  4.89* 6.77* 8.89*  CALCIUM 8.8*   < > 8.8*   < > 8.8* 9.0 9.2  PHOS  --   --   --   --   --   --  4.4   < > = values in this interval not displayed.     CBC: Recent Labs  Lab 11/02/20 0355 11/03/20 0403 11/04/20 0357 11/05/20 0414 11/08/20 0907  WBC 8.9 9.1 8.2 8.4 8.8  NEUTROABS  --  6.2 5.6 5.5  --   HGB 9.5* 9.5* 9.1* 8.8* 9.5*  HCT 27.5* 29.0* 27.7* 26.1*  29.1*  MCV 82.6 84.8 84.5 84.2 85.1  PLT 177 176 171 185 214      Lab Results  Component Value Date   HEPBSAG NON REACTIVE 06/26/2020   HEPBSAB Non Reactive 08/17/2016   HEPBIGM Negative 08/26/2016      Microbiology:  Recent Results (from the past 240 hour(s))  Resp Panel by RT-PCR (Flu A&B, Covid) Nasopharyngeal Swab     Status: None   Collection Time: 11/01/20  9:22 AM   Specimen: Nasopharyngeal Swab; Nasopharyngeal(NP) swabs in vial transport medium  Result Value Ref Range Status   SARS Coronavirus 2 by RT PCR NEGATIVE NEGATIVE Final    Comment: (NOTE) SARS-CoV-2 target nucleic acids are NOT DETECTED.  The SARS-CoV-2 RNA is generally detectable in upper respiratory specimens during the acute phase of infection. The lowest concentration of SARS-CoV-2 viral copies this assay can detect is 138 copies/mL. A negative result does not preclude SARS-Cov-2 infection and should not be used as the sole basis for  treatment or other patient management decisions. A negative result may occur with  improper specimen collection/handling, submission of specimen other than nasopharyngeal swab, presence of viral mutation(s) within the areas targeted by this assay, and inadequate number of viral copies(<138 copies/mL). A negative result must be combined with clinical observations, patient history, and epidemiological information. The expected result is Negative.  Fact Sheet for Patients:  EntrepreneurPulse.com.au  Fact Sheet for Healthcare Providers:  IncredibleEmployment.be  This test is no t yet approved or cleared by the [place] FDA and  has been authorized for detection and/or diagnosis of SARS-CoV-2 by FDA under an Emergency Use Authorization (EUA). This EUA will remain  in effect (meaning this test can be used) for the duration of the COVID-19 declaration under Section 564(b)(1) of the Act, 21 U.S.C.section 360bbb-3(b)(1), unless the  authorization is terminated  or revoked sooner.       Influenza A by PCR NEGATIVE NEGATIVE Final   Influenza B by PCR NEGATIVE NEGATIVE Final    Comment: (NOTE) The Xpert Xpress SARS-CoV-2/FLU/RSV plus assay is intended as an aid in the diagnosis of influenza from Nasopharyngeal swab specimens and should not be used as a sole basis for treatment. Nasal washings and aspirates are unacceptable for Xpert Xpress SARS-CoV-2/FLU/RSV testing.  Fact Sheet for Patients: EntrepreneurPulse.com.au  Fact Sheet for Healthcare Providers: IncredibleEmployment.be  This test is not yet approved or cleared by the [place] FDA and has been authorized for detection and/or diagnosis of SARS-CoV-2 by FDA under an Emergency Use Authorization (EUA). This EUA will remain in effect (meaning this test can be used) for the duration of the COVID-19 declaration under Section 564(b)(1) of the Act, 21 U.S.C. section 360bbb-3(b)(1), unless the authorization is terminated or revoked.  Performed at [facility], [address] [place], [place] [address]   MRSA PCR Screening     Status: None   Collection Time: 11/02/20 12:00 AM   Specimen: Nasopharyngeal  Result Value Ref Range Status   MRSA by PCR NEGATIVE NEGATIVE Final    Comment:        The GeneXpert MRSA Assay (FDA approved for NASAL specimens only), is one component of a comprehensive MRSA colonization surveillance program. It is not intended to diagnose MRSA infection nor to guide or monitor treatment for MRSA infections. Performed at [facility], [address] [place], [place] [address]     Coagulation Studies: No results for input(s): LABPROT, INR in the last 72 hours.  Urinalysis: No results for input(s): COLORURINE, LABSPEC, PHURINE, GLUCOSEU, HGBUR, BILIRUBINUR, KETONESUR, PROTEINUR, UROBILINOGEN, NITRITE, LEUKOCYTESUR in the last 72 hours.  Invalid input(s): APPERANCEUR     Imaging: No results found.   Medications:   . sodium chloride     . apixaban  2.5 mg Oral BID  . aspirin EC  81 mg Oral Daily  . atorvastatin  80 mg Oral q1800  . carvedilol  25 mg Oral BID  . Chlorhexidine Gluconate Cloth  6 each Topical Q0600  . epoetin (EPOGEN/PROCRIT) injection  10,000 Units Intravenous Q M,W,F-HD  . escitalopram  10 mg Oral Daily  . furosemide  40 mg Oral Once per day on Sun Tue Thu Sat  . gabapentin  300 mg Oral QHS  . insulin aspart  0-9 Units Subcutaneous TID WC  . insulin detemir  10 Units Subcutaneous QHS  . irbesartan  150 mg Oral Daily  . midodrine  5 mg Oral Once per day on Mon Wed Fri  . multivitamin  1  tablet Oral Daily  . pantoprazole  40 mg Oral BID  . polyethylene glycol  17 g Oral Daily  . sevelamer carbonate  1,600 mg Oral TID WC  . sodium chloride flush  3 mL Intravenous Q12H  . traZODone  50 mg Oral Once per day on Mon Wed Fri   sodium chloride, acetaminophen, albuterol, sodium chloride flush  Assessment/ Plan:  64 y.o. female with  was admitted on 11/01/2020 for  Principal Problem:   Acute hypoxemic respiratory failure ([name]) Active Problems:   Hypertension   Diabetes mellitus type 2, controlled ([place])   Acute on chronic diastolic heart failure ([name])   ESRD (end stage renal disease) ([name])   Atrial flutter ([name])   Acute diastolic CHF (congestive heart failure) ([place])   Tracheostomy status ([place])   CAP (community acquired pneumonia)  Acute respiratory failure with hypoxia ([place]) [D63.87] Acute diastolic CHF (congestive heart failure) ([place]) [I50.31] Community acquired pneumonia, unspecified laterality [J18.9]   MWF, estimated dry weight 61.5 kg, left arm AV graft, [facility]  #. ESRD MWF Schedule MWF, estimated dry weight 61.5 kg, left arm AV graft,  kidney center  Dialysis today Will continue MWF schedule while hospitalized  #. Anemia of CKD  Lab Results  Component Value Date   HGB 9.5 (L)  11/08/2020  Continue Epogen with HD  #. Secondary hyperparathyroidism of renal origin N 25.81      Component Value Date/Time   PTH 270 (H) 10/03/2017 0949   Lab Results  Component Value Date   PHOS 4.4 11/08/2020   Will continue monitoring bone mineral metabolism parameters   #. Diabetes type 2 with CKD Hgb A1c MFr Bld (%)  Date Value  11/01/2020 7.2 (H)   Blood glucose readings within acceptable range  # Pneumonia Respiratory status improving but still has c/o SOB with ambulation and exertion Primary team planning to discharge patient with home O2,. Oxygen concentrator in the room.   LOS: 7 Rithy Mandley 12/6/20214:30 PM  [facility] [place], [place]

## 2020-11-08 NOTE — Progress Notes (Signed)
SATURATION QUALIFICATIONS: (This note is used to comply with regulatory documentation for home oxygen)  Patient Saturations on Room Air at Rest = 82%  Patient Saturations on Room Air while Ambulating = n/a%  Patient Saturations on 3 Liters of oxygen while Ambulating = 95%  Please briefly explain why patient needs home oxygen: trach patient / CHF

## 2020-11-08 NOTE — Discharge Summary (Signed)
Physician Discharge Summary  Deborah [name] ZES:[id number] DOB: 19-Jan-1956 DOA: 11/01/2020  PCP: Leta Speller, MD  Admit date: 11/01/2020 Discharge date: 11/08/2020  Admitted From: Home Disposition:  Home  Recommendations for Outpatient Follow-up:  1. Follow up with PCP in 1-2 weeks 2. Please obtain BMP/CBC in one week 3. Please follow up on the following pending results:None  Home Health:No Equipment/Devices: Home oxygen Discharge Condition: Stable CODE STATUS: Full Diet recommendation: Heart Healthy / Carb Modified  Brief/Interim Summary: 53 black female HTN HFpEF MV replacement P A. 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 Layering fluid atelectasis? Infection-EKG RVH Initially met sepsis criteria secondary to pneumonia which was present on admission.  Procalcitonin was elevated at 0.37 but patient was also on dialysis.  She completed a course of ceftriaxone and Zithromax.  With improvement in her cough and increased tracheal secretions.  Continue to require 3 to 4 L of oxygen, she will be discharged with home oxygen and follow-up with her primary care provider and pulmonologist for further recommendations.  Patient has a chronic tracheostomy since 2017, not a candidate for decannulation. She is currently needing oxygen mostly with ambulation, her PCP or pulmonologist should be able to wean her off as she was not using oxygen at home home prior to this admission.  Patient will continue with her routine dialysis, she was dialyzed in the hospital before leaving today.  Patient has a history of HFrEF, which has been improved.  Recent echocardiogram with normal EF, LVH and  moderate pulmonary hypertension.  Volume is being managed with dialysis.  Patient appears euvolemic.  Patient will continue with rest of her home meds and follow-up with her providers on discharge.  Discharge Diagnoses:  Principal Problem:   Acute hypoxemic respiratory failure ([name]) Active Problems:   Hypertension   Diabetes mellitus type 2, controlled ([place])   Acute on chronic diastolic heart failure ([name])   ESRD (end stage renal disease) ([name])   Atrial flutter ([name])   Acute diastolic CHF (congestive heart failure) ([place])   Tracheostomy status ([place])   CAP (community acquired pneumonia)   Discharge Instructions  Discharge Instructions    Diet - low sodium heart healthy   Complete by: As directed    Discharge instructions   Complete by: As directed    It was pleasure taking care of you. You are being discharged with oxygen, please use it as directed and follow-up with your pulmonologist closely in order to wean you off from oxygen and further recommendations.   Discharge instructions   Complete by: As directed    It was pleasure taking care of you. You are being discharged with home oxygen, please use it as directed and follow-up with your primary care provider and pulmonologist for further recommendations. Continue with your dialysis according to your schedule.   Increase activity slowly   Complete by: As directed    Increase activity slowly   Complete by: As directed      Allergies as of 11/08/2020   No Known Allergies     Medication List    TAKE these medications   acetaminophen 325 MG tablet Commonly known as: TYLENOL Take 650 mg by mouth every 6 (six) hours as needed for mild  pain or fever.   albuterol (2.5 MG/3ML) 0.083% nebulizer solution Commonly known as: PROVENTIL Take 3 mLs (2.5 mg total) by nebulization 4 (four) times daily - after meals and at bedtime. What changed:   when to take this  reasons to take this   amLODipine 10 MG tablet Commonly known  as: NORVASC Take 10 mg by mouth daily.   apixaban 2.5 MG Tabs tablet Commonly known as: ELIQUIS Take 2.5 mg by mouth 2 (two) times daily.   aspirin 81 MG EC tablet Take 1 tablet (81 mg total) by mouth daily.   atorvastatin 80 MG tablet Commonly known as: LIPITOR Take 80 mg by mouth daily at 6 PM.   carvedilol 25 MG tablet Commonly known as: COREG Take 25 mg by mouth 2 (two) times daily.   escitalopram 10 MG tablet Commonly known as: LEXAPRO Take 10 mg by mouth daily.   furosemide 40 MG tablet Commonly known as: LASIX Take 40 mg by mouth daily. ON NON dialysis days, Sunday, Tuesday, Thursday, Saturday   gabapentin 300 MG capsule Commonly known as: NEURONTIN Take 1 capsule (300 mg total) by mouth at bedtime.   Levemir FlexTouch 100 UNIT/ML FlexPen Generic drug: insulin detemir Inject 13 Units into the skin at bedtime.   lidocaine-prilocaine cream Commonly known as: EMLA Apply 1 application topically every dialysis.   midodrine 5 MG tablet Commonly known as: PROAMATINE Take 5 mg by mouth 3 (three) times a week.   multivitamin Tabs tablet Take 1 tablet by mouth daily.   olmesartan 20 MG tablet Commonly known as: BENICAR Take 20 mg by mouth daily.   pantoprazole 40 MG tablet Commonly known as: PROTONIX Take 1 tablet (40 mg total) by mouth 2 (two) times daily.   sevelamer carbonate 800 MG tablet Commonly known as: RENVELA Take 1,600 mg by mouth 3 (three) times daily with meals.   traZODone 50 MG tablet Commonly known as: DESYREL Take 50 mg by mouth 3 (three) times a week. Take on Dialysis days            Durable Medical Equipment  (From admission, onward)         Start     Ordered   11/06/20 1218  For home use only DME oxygen  Once       Comments: Patient has trach collar and will need oxygen through that at 30%  Question Answer Comment  Length of Need 6 Months   Liters per Minute 3   Frequency Continuous (stationary and portable oxygen unit  needed)   Oxygen delivery system Gas      12 /04/21 1218          Follow-up Information    Leta Speller, MD. Schedule an appointment as soon as possible for a visit.   Specialty: Internal Medicine Contact information: [address] [place] [address] [phone]              No Known Allergies  Consultations:  Nephrology  Procedures/Studies: DG Chest 2 View  Result Date: 11/03/2020 CLINICAL DATA:  Pneumonia. EXAM: CHEST - 2 VIEW COMPARISON:  November 01, 2020. FINDINGS: Stable cardiomegaly. Status post cardiac valve repair. Tracheostomy tube is in good position. No pneumothorax is noted. Stable right basilar subsegmental atelectasis is noted. Increased left basilar opacity is noted concerning for worsening atelectasis or pneumonia with small left pleural effusion. Bony thorax is unremarkable. IMPRESSION: Increased left basilar opacity is noted concerning for worsening atelectasis or pneumonia with small left pleural effusion. Electronically  Signed   By: Marijo Conception M.D.   On: 11/03/2020 08:28   DG Chest 2 View  Result Date: 11/01/2020 CLINICAL DATA:  Shortness of breath. EXAM: CHEST - 2 VIEW COMPARISON:  11/01/2028 FINDINGS: Sternotomy wires, prosthetic heart valve and tracheostomy tube unchanged. Lungs are adequately inflated and demonstrate hazy opacification over the right base which may be due to combination layering pleural fluid and atelectasis although infection is possible. There is mild hazy prominence of the central pulmonary vessels likely mild degree of vascular congestion. Stable cardiomegaly. Remainder of the exam is unchanged. IMPRESSION: 1. Hazy opacification over the right base which may be due to combination of layering pleural fluid and atelectasis, although infection is possible. 2. Cardiomegaly with suggestion of mild vascular congestion. Electronically Signed   By: Marin Olp M.D.   On: 11/01/2020 09:44     Subjective: Patient was  feeling better when seen today.  No new complaint.  She was agreeable to leave after dialysis today.  Oxygen concentrator was at bedside.  Discharge Exam: Vitals:   11/08/20 0834 11/08/20 1122  BP: (!) 165/71 (!) 179/84  Pulse: 70 69  Resp: 18 18  Temp: 97.8 F (36.6 C) 97.7 F (36.5 C)  SpO2: 100% 93%   Vitals:   11/07/20 1938 11/08/20 0415 11/08/20 0834 11/08/20 1122  BP: (!) 159/66 (!) 134/55 (!) 165/71 (!) 179/84  Pulse: 72 66 70 69  Resp: 18 18 18 18   Temp: 97.9 F (36.6 C) 98 F (36.7 C) 97.8 F (36.6 C) 97.7 F (36.5 C)  TempSrc: Oral Oral Oral   SpO2: 99% 98% 100% 93%  Weight:  65.2 kg    Height:        General: Pt is alert, awake, not in acute distress, tracheostomy in place. Cardiovascular: RRR, S1/S2 +, no rubs, no gallops Respiratory: CTA bilaterally, no wheezing, no rhonchi Abdominal: Soft, NT, ND, bowel sounds + Extremities: no edema, no cyanosis   The results of significant diagnostics from this hospitalization (including imaging, microbiology, ancillary and laboratory) are listed below for reference.    Microbiology: Recent Results (from the past 240 hour(s))  Resp Panel by RT-PCR (Flu A&B, Covid) Nasopharyngeal Swab     Status: None   Collection Time: 11/01/20  9:22 AM   Specimen: Nasopharyngeal Swab; Nasopharyngeal(NP) swabs in vial transport medium  Result Value Ref Range Status   SARS Coronavirus 2 by RT PCR NEGATIVE NEGATIVE Final    Comment: (NOTE) SARS-CoV-2 target nucleic acids are NOT DETECTED.  The SARS-CoV-2 RNA is generally detectable in upper respiratory specimens during the acute phase of infection. The lowest concentration of SARS-CoV-2 viral copies this assay can detect is 138 copies/mL. A negative result does not preclude SARS-Cov-2 infection and should not be used as the sole basis for treatment or other patient management decisions. A negative result may occur with  improper specimen collection/handling, submission of specimen  other than nasopharyngeal swab, presence of viral mutation(s) within the areas targeted by this assay, and inadequate number of viral copies(<138 copies/mL). A negative result must be combined with clinical observations, patient history, and epidemiological information. The expected result is Negative.  Fact Sheet for Patients:  EntrepreneurPulse.com.au  Fact Sheet for Healthcare Providers:  IncredibleEmployment.be  This test is no t yet approved or cleared by the [place] FDA and  has been authorized for detection and/or diagnosis of SARS-CoV-2 by FDA under an Emergency Use Authorization (EUA). This EUA will remain  in effect (meaning this test  can be used) for the duration of the COVID-19 declaration under Section 564(b)(1) of the Act, 21 U.S.C.section 360bbb-3(b)(1), unless the authorization is terminated  or revoked sooner.       Influenza A by PCR NEGATIVE NEGATIVE Final   Influenza B by PCR NEGATIVE NEGATIVE Final    Comment: (NOTE) The Xpert Xpress SARS-CoV-2/FLU/RSV plus assay is intended as an aid in the diagnosis of influenza from Nasopharyngeal swab specimens and should not be used as a sole basis for treatment. Nasal washings and aspirates are unacceptable for Xpert Xpress SARS-CoV-2/FLU/RSV testing.  Fact Sheet for Patients: EntrepreneurPulse.com.au  Fact Sheet for Healthcare Providers: IncredibleEmployment.be  This test is not yet approved or cleared by the [place] FDA and has been authorized for detection and/or diagnosis of SARS-CoV-2 by FDA under an Emergency Use Authorization (EUA). This EUA will remain in effect (meaning this test can be used) for the duration of the COVID-19 declaration under Section 564(b)(1) of the Act, 21 U.S.C. section 360bbb-3(b)(1), unless the authorization is terminated or revoked.  Performed at [facility], [address]  [place], [place] [address]   MRSA PCR Screening     Status: None   Collection Time: 11/02/20 12:00 AM   Specimen: Nasopharyngeal  Result Value Ref Range Status   MRSA by PCR NEGATIVE NEGATIVE Final    Comment:        The GeneXpert MRSA Assay (FDA approved for NASAL specimens only), is one component of a comprehensive MRSA colonization surveillance program. It is not intended to diagnose MRSA infection nor to guide or monitor treatment for MRSA infections. Performed at [facility], [address] [place],  [address]      Labs: BNP (last 3 results) Recent Labs    04/02/20 0003 04/03/20 0404 11/01/20 0912  BNP 959.0* 598.0* 762.8*   Basic Metabolic Panel: Recent Labs  Lab 11/02/20 0355 11/03/20 0403 11/04/20 0357 11/05/20 0414 11/08/20 0907  NA 138 139 137 135 135  K 3.5 4.2 3.9 4.2 5.3*  CL 99 101 97* 97* 99  CO2 29 27 31 27 24   GLUCOSE 99 157* 72 131* 134*  BUN 28* 56* 28* 49* 73*  CREATININE 4.77* 6.70* 4.89* 6.77* 8.89*  CALCIUM 8.8* 8.8* 8.8* 9.0 9.2  PHOS  --   --   --   --  4.4   Liver Function Tests: Recent Labs  Lab 11/03/20 0403 11/04/20 0357 11/05/20 0414  AST 18 18 16   ALT 25 23 19   ALKPHOS 61 65 60  BILITOT 1.1 1.3* 1.1  PROT 6.5 6.5 6.4*  ALBUMIN 3.3* 3.2* 3.1*   No results for input(s): LIPASE, AMYLASE in the last 168 hours. No results for input(s): AMMONIA in the last 168 hours. CBC: Recent Labs  Lab 11/02/20 0355 11/03/20 0403 11/04/20 0357 11/05/20 0414 11/08/20 0907  WBC 8.9 9.1 8.2 8.4 8.8  NEUTROABS  --  6.2 5.6 5.5  --   HGB 9.5* 9.5* 9.1* 8.8* 9.5*  HCT 27.5* 29.0* 27.7* 26.1* 29.1*  MCV 82.6 84.8 84.5 84.2 85.1  PLT 177 176 171 185 214   Cardiac Enzymes: No results for input(s): CKTOTAL, CKMB, CKMBINDEX, TROPONINI in the last 168 hours. BNP: Invalid input(s): POCBNP CBG: Recent Labs  Lab 11/07/20 1145 11/07/20 1643 11/07/20 1940 11/08/20 0831 11/08/20 1119  GLUCAP 134* 101* 193* 119* 140*    D-Dimer No results for input(s): DDIMER in the last 72 hours. Hgb A1c No results for input(s): HGBA1C in the last  72 hours. Lipid Profile No results for input(s): CHOL, HDL, LDLCALC, TRIG, CHOLHDL, LDLDIRECT in the last 72 hours. Thyroid function studies No results for input(s): TSH, T4TOTAL, T3FREE, THYROIDAB in the last 72 hours.  Invalid input(s): FREET3 Anemia work up No results for input(s): VITAMINB12, FOLATE, FERRITIN, TIBC, IRON, RETICCTPCT in the last 72 hours. Urinalysis    Component Value Date/Time   COLORURINE YELLOW (A) 09/09/2017 1056   APPEARANCEUR HAZY (A) 09/09/2017 1056   LABSPEC 1.013 09/09/2017 1056   PHURINE 7.0 09/09/2017 1056   GLUCOSEU NEGATIVE 09/09/2017 1056   HGBUR NEGATIVE 09/09/2017 1056   BILIRUBINUR NEGATIVE 09/09/2017 1056   KETONESUR NEGATIVE 09/09/2017 1056   PROTEINUR 100 (A) 09/09/2017 1056   NITRITE NEGATIVE 09/09/2017 1056   LEUKOCYTESUR MODERATE (A) 09/09/2017 1056   Sepsis Labs Invalid input(s): PROCALCITONIN,  WBC,  LACTICIDVEN Microbiology Recent Results (from the past 240 hour(s))  Resp Panel by RT-PCR (Flu A&B, Covid) Nasopharyngeal Swab     Status: None   Collection Time: 11/01/20  9:22 AM   Specimen: Nasopharyngeal Swab; Nasopharyngeal(NP) swabs in vial transport medium  Result Value Ref Range Status   SARS Coronavirus 2 by RT PCR NEGATIVE NEGATIVE Final    Comment: (NOTE) SARS-CoV-2 target nucleic acids are NOT DETECTED.  The SARS-CoV-2 RNA is generally detectable in upper respiratory specimens during the acute phase of infection. The lowest concentration of SARS-CoV-2 viral copies this assay can detect is 138 copies/mL. A negative result does not preclude SARS-Cov-2 infection and should not be used as the sole basis for treatment or other patient management decisions. A negative result may occur with  improper specimen collection/handling, submission of specimen other than nasopharyngeal swab, presence of viral  mutation(s) within the areas targeted by this assay, and inadequate number of viral copies(<138 copies/mL). A negative result must be combined with clinical observations, patient history, and epidemiological information. The expected result is Negative.  Fact Sheet for Patients:  EntrepreneurPulse.com.au  Fact Sheet for Healthcare Providers:  IncredibleEmployment.be  This test is no t yet approved or cleared by the [place] FDA and  has been authorized for detection and/or diagnosis of SARS-CoV-2 by FDA under an Emergency Use Authorization (EUA). This EUA will remain  in effect (meaning this test can be used) for the duration of the COVID-19 declaration under Section 564(b)(1) of the Act, 21 U.S.C.section 360bbb-3(b)(1), unless the authorization is terminated  or revoked sooner.       Influenza A by PCR NEGATIVE NEGATIVE Final   Influenza B by PCR NEGATIVE NEGATIVE Final    Comment: (NOTE) The Xpert Xpress SARS-CoV-2/FLU/RSV plus assay is intended as an aid in the diagnosis of influenza from Nasopharyngeal swab specimens and should not be used as a sole basis for treatment. Nasal washings and aspirates are unacceptable for Xpert Xpress SARS-CoV-2/FLU/RSV testing.  Fact Sheet for Patients: EntrepreneurPulse.com.au  Fact Sheet for Healthcare Providers: IncredibleEmployment.be  This test is not yet approved or cleared by the [place] FDA and has been authorized for detection and/or diagnosis of SARS-CoV-2 by FDA under an Emergency Use Authorization (EUA). This EUA will remain in effect (meaning this test can be used) for the duration of the COVID-19 declaration under Section 564(b)(1) of the Act, 21 U.S.C. section 360bbb-3(b)(1), unless the authorization is terminated or revoked.  Performed at [facility], [address], [place], [place] [address]   MRSA PCR Screening     Status:  None   Collection Time: 11/02/20 12:00 AM   Specimen: Nasopharyngeal  Result Value Ref Range Status   MRSA by PCR NEGATIVE NEGATIVE Final    Comment:        The GeneXpert MRSA Assay (FDA approved for NASAL specimens only), is one component of a comprehensive MRSA colonization surveillance program. It is not intended to diagnose MRSA infection nor to guide or monitor treatment for MRSA infections. Performed at [facility], [address] [place], [place] [address]     Time coordinating discharge: Over 30 minutes  SIGNED:  Lorella Nimrod, MD  [facility] 11/08/2020, 12:46 PM  If 7PM-7AM, please contact night-coverage [email or web address]  This record has been created using Systems analyst. Errors have been sought and corrected,but may not always be located. Such creation errors do not reflect on the standard of care.

## 2020-11-08 NOTE — TOC Progression Note (Signed)
Transition of Care [facility]) - Progression Note    Patient Details  Name: Deborah [name] MRN: [id number] Date of Birth: 1956/05/10  [facility] of [facility]) CM/SW Contact  Beverly Sessions, RN Phone Number: 11/08/2020, 4:30 PM  Clinical Narrative:     Confirmed that patient has portable O2 concentrator through [employer] in room. Patient has ambulated today on 3L and did well per nursing. Zack with [employer] aware of discharge   Patient to discharge today after HD.  Patient confirms that her daughter can transport at discharge  [facility] confirmed that patient is not currently open with home health services.  The services she currently has are Medicaid PCS services through [place], and she is in the process of changing companies to a a "place on [employer]"  Patient agreeable to home health nursing.  States she does not have a preference of agency.  Referral made to [facility] with [place] dialysis liaison notified of discharge     Expected Discharge Plan: [place] Barriers to Discharge: Continued Medical Work up  Expected Discharge Plan and Services Expected Discharge Plan: [place] In-house Referral: NA   Post Acute Care Choice: [place] arrangements for the past 2 months: Single Family Home Expected Discharge Date: 11/08/20                         [facility] Arranged: NA           Social Determinants of Health (SDOH) Interventions    Readmission Risk Interventions Readmission Risk Prevention Plan 11/04/2020 04/02/2020 10/18/2019  Transportation Screening Complete Complete Complete  Medication Review [employer]) (No Data) Complete -  PCP or Specialist appointment within 3-5 days of discharge Complete Complete Complete  HRI or Home Care Consult (No Data) Complete -  SW Recovery Care/Counseling Consult Complete - -  Palliative Care Screening Not Applicable - -  [place] Not Applicable Complete Not  Applicable  Some recent data might be hidden

## 2020-11-08 NOTE — Plan of Care (Signed)

## 2020-11-09 LAB — GLUCOSE, CAPILLARY
Glucose-Capillary: 141 mg/dL — ABNORMAL HIGH (ref 70–99)
Glucose-Capillary: 144 mg/dL — ABNORMAL HIGH (ref 70–99)
Glucose-Capillary: 126 mg/dL — ABNORMAL HIGH (ref 70–99)

## 2020-11-09 NOTE — Progress Notes (Signed)
[facility], [place] 11/09/20  Subjective:   LOS: 8  Patient sitting up in bed, in no acute distress.  She received dialysis treatment yesterday and tolerated well   Objective:  Vital signs in last 24 hours:  Temp:  [97.4 F (36.3 C)-98.8 F (37.1 C)] 98.8 F (37.1 C) (12/07 1144) Pulse Rate:  [66-80] 66 (12/07 1144) Resp:  [15-22] 18 (12/07 1144) BP: (130-170)/(58-77) 157/72 (12/07 1144) SpO2:  [91 %-100 %] 98 % (12/07 1144) Weight:  [64.4 kg] 64.4 kg (12/07 0320)  Weight change: -0.862 kg Filed Weights   11/07/20 0345 11/08/20 0415 11/09/20 0320  Weight: 64.1 kg 65.2 kg 64.4 kg    Intake/Output:    Intake/Output Summary (Last 24 hours) at 11/09/2020 1445 Last data filed at 11/09/2020 1300 Gross per 24 hour  Intake 240 ml  Output 1500 ml  Net -1260 ml    Physical Exam: General:  Awake, alert, in no acute distress  HEENT  oral mucous membranes moist  Pulm/lungs  respirations symmetrical, unlabored at rest, lungs with rhonchi on right side  CVS/Heart  regular rate and rhythm  Abdomen:   Soft, nontender, nondistended  Extremities:  No peripheral edema  Neurologic:  Speech clear and appropriate   Skin:  No acute lesions or rashes  Left arm AVG+ bruit,+Thrill  Basic Metabolic Panel:  Recent Labs  Lab 11/03/20 0403 11/03/20 0403 11/04/20 0357 11/05/20 0414 11/08/20 0907  NA 139  --  137 135 135  K 4.2  --  3.9 4.2 5.3*  CL 101  --  97* 97* 99  CO2 27  --  31 27 24   GLUCOSE 157*  --  72 131* 134*  BUN 56*  --  28* 49* 73*  CREATININE 6.70*  --  4.89* 6.77* 8.89*  CALCIUM 8.8*   < > 8.8* 9.0 9.2  PHOS  --   --   --   --  4.4   < > = values in this interval not displayed.     CBC: Recent Labs  Lab 11/03/20 0403 11/04/20 0357 11/05/20 0414 11/08/20 0907  WBC 9.1 8.2 8.4 8.8  NEUTROABS 6.2 5.6 5.5  --   HGB 9.5* 9.1* 8.8* 9.5*  HCT 29.0* 27.7* 26.1* 29.1*  MCV 84.8 84.5 84.2 85.1  PLT 176 171 185 214      Lab  Results  Component Value Date   HEPBSAG NON REACTIVE 06/26/2020   HEPBSAB Non Reactive 08/17/2016   HEPBIGM Negative 08/26/2016      Microbiology:  Recent Results (from the past 240 hour(s))  Resp Panel by RT-PCR (Flu A&B, Covid) Nasopharyngeal Swab     Status: None   Collection Time: 11/01/20  9:22 AM   Specimen: Nasopharyngeal Swab; Nasopharyngeal(NP) swabs in vial transport medium  Result Value Ref Range Status   SARS Coronavirus 2 by RT PCR NEGATIVE NEGATIVE Final    Comment: (NOTE) SARS-CoV-2 target nucleic acids are NOT DETECTED.  The SARS-CoV-2 RNA is generally detectable in upper respiratory specimens during the acute phase of infection. The lowest concentration of SARS-CoV-2 viral copies this assay can detect is 138 copies/mL. A negative result does not preclude SARS-Cov-2 infection and should not be used as the sole basis for treatment or other patient management decisions. A negative result may occur with  improper specimen collection/handling, submission of specimen other than nasopharyngeal swab, presence of viral mutation(s) within the areas targeted by this assay, and inadequate number of viral copies(<138 copies/mL). A negative  result must be combined with clinical observations, patient history, and epidemiological information. The expected result is Negative.  Fact Sheet for Patients:  EntrepreneurPulse.com.au  Fact Sheet for Healthcare Providers:  IncredibleEmployment.be  This test is no t yet approved or cleared by the [place] FDA and  has been authorized for detection and/or diagnosis of SARS-CoV-2 by FDA under an Emergency Use Authorization (EUA). This EUA will remain  in effect (meaning this test can be used) for the duration of the COVID-19 declaration under Section 564(b)(1) of the Act, 21 U.S.C.section 360bbb-3(b)(1), unless the authorization is terminated  or revoked sooner.       Influenza A by PCR  NEGATIVE NEGATIVE Final   Influenza B by PCR NEGATIVE NEGATIVE Final    Comment: (NOTE) The Xpert Xpress SARS-CoV-2/FLU/RSV plus assay is intended as an aid in the diagnosis of influenza from Nasopharyngeal swab specimens and should not be used as a sole basis for treatment. Nasal washings and aspirates are unacceptable for Xpert Xpress SARS-CoV-2/FLU/RSV testing.  Fact Sheet for Patients: EntrepreneurPulse.com.au  Fact Sheet for Healthcare Providers: IncredibleEmployment.be  This test is not yet approved or cleared by the [place] FDA and has been authorized for detection and/or diagnosis of SARS-CoV-2 by FDA under an Emergency Use Authorization (EUA). This EUA will remain in effect (meaning this test can be used) for the duration of the COVID-19 declaration under Section 564(b)(1) of the Act, 21 U.S.C. section 360bbb-3(b)(1), unless the authorization is terminated or revoked.  Performed at [facility], [address] [place], [place] [address]   MRSA PCR Screening     Status: None   Collection Time: 11/02/20 12:00 AM   Specimen: Nasopharyngeal  Result Value Ref Range Status   MRSA by PCR NEGATIVE NEGATIVE Final    Comment:        The GeneXpert MRSA Assay (FDA approved for NASAL specimens only), is one component of a comprehensive MRSA colonization surveillance program. It is not intended to diagnose MRSA infection nor to guide or monitor treatment for MRSA infections. Performed at [facility], [address] [place], [place] [address]     Coagulation Studies: No results for input(s): LABPROT, INR in the last 72 hours.  Urinalysis: No results for input(s): COLORURINE, LABSPEC, PHURINE, GLUCOSEU, HGBUR, BILIRUBINUR, KETONESUR, PROTEINUR, UROBILINOGEN, NITRITE, LEUKOCYTESUR in the last 72 hours.  Invalid input(s): APPERANCEUR    Imaging: No results found.   Medications:   . sodium chloride      . apixaban  2.5 mg Oral BID  . aspirin EC  81 mg Oral Daily  . atorvastatin  80 mg Oral q1800  . carvedilol  25 mg Oral BID  . Chlorhexidine Gluconate Cloth  6 each Topical Q0600  . epoetin (EPOGEN/PROCRIT) injection  10,000 Units Intravenous Q M,W,F-HD  . escitalopram  10 mg Oral Daily  . furosemide  40 mg Oral Once per day on Sun Tue Thu Sat  . gabapentin  300 mg Oral QHS  . insulin aspart  0-9 Units Subcutaneous TID WC  . insulin detemir  10 Units Subcutaneous QHS  . irbesartan  150 mg Oral Daily  . midodrine  5 mg Oral Once per day on Mon Wed Fri  . multivitamin  1 tablet Oral Daily  . pantoprazole  40 mg Oral BID  . polyethylene glycol  17 g Oral Daily  . sevelamer carbonate  1,600 mg Oral TID WC  . sodium chloride flush  3 mL Intravenous Q12H  . traZODone  50 mg Oral Once per day on Mon Wed Fri   sodium chloride, acetaminophen, albuterol, sodium chloride flush  Assessment/ Plan:  64 y.o. female with  was admitted on 11/01/2020 for  Principal Problem:   Acute hypoxemic respiratory failure ([name]) Active Problems:   Hypertension   Diabetes mellitus type 2, controlled ([place])   Acute on chronic diastolic heart failure ([name])   ESRD (end stage renal disease) ([name])   Atrial flutter ([name])   Acute diastolic CHF (congestive heart failure) ([place])   Tracheostomy status ([place])   CAP (community acquired pneumonia)  Acute respiratory failure with hypoxia ([place]) [C37.54] Acute diastolic CHF (congestive heart failure) ([place]) [I50.31] Community acquired pneumonia, unspecified laterality [J18.9]   MWF, estimated dry weight 61.5 kg, left arm AV graft, [facility]  #. ESRD MWF Schedule MWF, estimated dry weight 61.5 kg, left arm AV graft,  [facility]  Patient received dialysis treatment yesterday Volume and electrolyte status acceptable No additional dialysis treatment required today We will continue MWF schedule  #. Anemia of CKD  Lab Results  Component  Value Date   HGB 9.5 (L) 11/08/2020  Continue Epogen with HD  #. Secondary hyperparathyroidism of renal origin N 25.81      Component Value Date/Time   PTH 270 (H) 10/03/2017 0949   Lab Results  Component Value Date   PHOS 4.4 11/08/2020   Continue sevelamer   #. Diabetes type 2 with CKD Hgb A1c MFr Bld (%)  Date Value  11/01/2020 7.2 (H)   Continue insulin aspart and insulin detemir  # Pneumonia Patient completed antibiotic course with ceftriaxone and azithromycin during this hospitalization Dyspnea on exertion improving, but still present Low SPO2 levels on ambulation Management per primary team   LOS: [place] 12/7/20212:45 [place], [place]

## 2020-11-09 NOTE — Plan of Care (Signed)

## 2020-11-09 NOTE — Progress Notes (Signed)
No charge progress note.  Patient was discharged yesterday, unable to leave as dialysis was finished after 1030. No new complaint today.  Will leave once daughter is available to pick her up which will be close to 5:00.

## 2020-11-10 ENCOUNTER — Inpatient Hospital Stay: Payer: Medicare Other

## 2020-11-10 ENCOUNTER — Emergency Department: Payer: Medicare Other

## 2020-11-10 ENCOUNTER — Inpatient Hospital Stay
Admission: EM | Admit: 2020-11-10 | Discharge: 2020-11-12 | DRG: 193 | Disposition: A | Discharge status: Home / Self Care | Payer: Medicare Other | Attending: Family Medicine | Admitting: Family Medicine

## 2020-11-10 ENCOUNTER — Other Ambulatory Visit: Payer: Self-pay

## 2020-11-10 ENCOUNTER — Encounter: Payer: Self-pay | Admitting: Emergency Medicine

## 2020-11-10 DIAGNOSIS — J209 Acute bronchitis, unspecified: Secondary | ICD-10-CM | POA: Diagnosis present

## 2020-11-10 DIAGNOSIS — E119 Type 2 diabetes mellitus without complications: Secondary | ICD-10-CM

## 2020-11-10 DIAGNOSIS — Z79899 Other long term (current) drug therapy: Secondary | ICD-10-CM | POA: Diagnosis not present

## 2020-11-10 DIAGNOSIS — K219 Gastro-esophageal reflux disease without esophagitis: Secondary | ICD-10-CM | POA: Diagnosis present

## 2020-11-10 DIAGNOSIS — R0602 Shortness of breath: Secondary | ICD-10-CM

## 2020-11-10 DIAGNOSIS — E785 Hyperlipidemia, unspecified: Secondary | ICD-10-CM | POA: Diagnosis present

## 2020-11-10 DIAGNOSIS — I10 Essential (primary) hypertension: Secondary | ICD-10-CM | POA: Diagnosis present

## 2020-11-10 DIAGNOSIS — J9621 Acute and chronic respiratory failure with hypoxia: Secondary | ICD-10-CM | POA: Diagnosis present

## 2020-11-10 DIAGNOSIS — Z833 Family history of diabetes mellitus: Secondary | ICD-10-CM

## 2020-11-10 DIAGNOSIS — Z794 Long term (current) use of insulin: Secondary | ICD-10-CM | POA: Diagnosis not present

## 2020-11-10 DIAGNOSIS — J13 Pneumonia due to Streptococcus pneumoniae: Principal | ICD-10-CM | POA: Diagnosis present

## 2020-11-10 DIAGNOSIS — K117 Disturbances of salivary secretion: Secondary | ICD-10-CM

## 2020-11-10 DIAGNOSIS — I5032 Chronic diastolic (congestive) heart failure: Secondary | ICD-10-CM | POA: Diagnosis present

## 2020-11-10 DIAGNOSIS — Z20822 Contact with and (suspected) exposure to covid-19: Secondary | ICD-10-CM | POA: Diagnosis present

## 2020-11-10 DIAGNOSIS — Z930 Tracheostomy status: Secondary | ICD-10-CM

## 2020-11-10 DIAGNOSIS — Z8249 Family history of ischemic heart disease and other diseases of the circulatory system: Secondary | ICD-10-CM

## 2020-11-10 DIAGNOSIS — I132 Hypertensive heart and chronic kidney disease with heart failure and with stage 5 chronic kidney disease, or end stage renal disease: Secondary | ICD-10-CM | POA: Diagnosis present

## 2020-11-10 DIAGNOSIS — Z992 Dependence on renal dialysis: Secondary | ICD-10-CM | POA: Diagnosis not present

## 2020-11-10 DIAGNOSIS — J386 Stenosis of larynx: Secondary | ICD-10-CM | POA: Diagnosis present

## 2020-11-10 DIAGNOSIS — Z7901 Long term (current) use of anticoagulants: Secondary | ICD-10-CM

## 2020-11-10 DIAGNOSIS — E1122 Type 2 diabetes mellitus with diabetic chronic kidney disease: Secondary | ICD-10-CM | POA: Diagnosis present

## 2020-11-10 DIAGNOSIS — N186 End stage renal disease: Secondary | ICD-10-CM | POA: Diagnosis present

## 2020-11-10 DIAGNOSIS — D631 Anemia in chronic kidney disease: Secondary | ICD-10-CM | POA: Diagnosis present

## 2020-11-10 DIAGNOSIS — I252 Old myocardial infarction: Secondary | ICD-10-CM | POA: Diagnosis not present

## 2020-11-10 DIAGNOSIS — Z7982 Long term (current) use of aspirin: Secondary | ICD-10-CM

## 2020-11-10 DIAGNOSIS — I480 Paroxysmal atrial fibrillation: Secondary | ICD-10-CM | POA: Diagnosis present

## 2020-11-10 DIAGNOSIS — J9601 Acute respiratory failure with hypoxia: Secondary | ICD-10-CM | POA: Diagnosis not present

## 2020-11-10 DIAGNOSIS — R0902 Hypoxemia: Secondary | ICD-10-CM

## 2020-11-10 DIAGNOSIS — J398 Other specified diseases of upper respiratory tract: Secondary | ICD-10-CM

## 2020-11-10 HISTORY — DX: End stage renal disease: N18.6

## 2020-11-10 HISTORY — DX: Unspecified atrial fibrillation: I48.91

## 2020-11-10 LAB — BASIC METABOLIC PANEL
Anion gap: 14 (ref 5–15)
BUN: 50 mg/dL — ABNORMAL HIGH (ref 8–23)
CO2: 24 mmol/L (ref 22–32)
Calcium: 9.2 mg/dL (ref 8.9–10.3)
Chloride: 99 mmol/L (ref 98–111)
Creatinine, Ser: 6.68 mg/dL — ABNORMAL HIGH (ref 0.44–1.00)
GFR, Estimated: 6 mL/min — ABNORMAL LOW (ref >60)
Glucose, Bld: 189 mg/dL — ABNORMAL HIGH (ref 70–99)
Potassium: 4.9 mmol/L (ref 3.5–5.1)
Sodium: 137 mmol/L (ref 135–145)

## 2020-11-10 LAB — CBC
HCT: 26.1 % — ABNORMAL LOW (ref 36.0–46.0)
Hemoglobin: 9.1 g/dL — ABNORMAL LOW (ref 12.0–15.0)
MCH: 28.3 pg (ref 26.0–34.0)
MCHC: 34.9 g/dL (ref 30.0–36.0)
MCV: 81.1 fL (ref 80.0–100.0)
Platelets: 198 10*3/uL (ref 150–400)
RBC: 3.22 MIL/uL — ABNORMAL LOW (ref 3.87–5.11)
RDW: 17.2 % — ABNORMAL HIGH (ref 11.5–15.5)
WBC: 11.6 10*3/uL — ABNORMAL HIGH (ref 4.0–10.5)
nRBC: 0 % (ref 0.0–0.2)

## 2020-11-10 LAB — PROCALCITONIN: Procalcitonin: 0.31 ng/mL

## 2020-11-10 LAB — BRAIN NATRIURETIC PEPTIDE: B Natriuretic Peptide: 736.4 pg/mL — ABNORMAL HIGH (ref 0.0–100.0)

## 2020-11-10 LAB — RESP PANEL BY RT-PCR (FLU A&B, COVID) ARPGX2
Influenza A by PCR: NEGATIVE
Influenza B by PCR: NEGATIVE
SARS Coronavirus 2 by RT PCR: NEGATIVE

## 2020-11-10 LAB — TROPONIN I (HIGH SENSITIVITY): Troponin I (High Sensitivity): 15 ng/L (ref <18)

## 2020-11-10 MED ORDER — GABAPENTIN 300 MG PO CAPS
300.0000 mg | ORAL_CAPSULE | Freq: Every day | ORAL | Status: DC
  Administered 2020-11-10 – 2020-11-11 (×2): 300 mg via ORAL
  Filled 2020-11-10 (×2): qty 1

## 2020-11-10 MED ORDER — ATORVASTATIN CALCIUM 80 MG PO TABS
80.0000 mg | ORAL_TABLET | Freq: Every day | ORAL | Status: DC
  Administered 2020-11-11: 80 mg via ORAL
  Filled 2020-11-10: qty 1

## 2020-11-10 MED ORDER — SEVELAMER CARBONATE 800 MG PO TABS
1600.0000 mg | ORAL_TABLET | Freq: Three times a day (TID) | ORAL | Status: DC
  Administered 2020-11-11 (×3): 1600 mg via ORAL
  Filled 2020-11-10 (×3): qty 2

## 2020-11-10 MED ORDER — IPRATROPIUM-ALBUTEROL 0.5-2.5 (3) MG/3ML IN SOLN
3.0000 mL | Freq: Four times a day (QID) | RESPIRATORY_TRACT | Status: DC
  Administered 2020-11-10 – 2020-11-11 (×3): 3 mL via RESPIRATORY_TRACT
  Filled 2020-11-10 (×3): qty 3

## 2020-11-10 MED ORDER — CARVEDILOL 25 MG PO TABS
25.0000 mg | ORAL_TABLET | Freq: Two times a day (BID) | ORAL | Status: DC
  Administered 2020-11-10 – 2020-11-11 (×3): 25 mg via ORAL
  Filled 2020-11-10 (×3): qty 1

## 2020-11-10 MED ORDER — AMLODIPINE BESYLATE 10 MG PO TABS
10.0000 mg | ORAL_TABLET | Freq: Every day | ORAL | Status: DC
  Administered 2020-11-11: 10 mg via ORAL
  Filled 2020-11-10: qty 1

## 2020-11-10 MED ORDER — ESCITALOPRAM OXALATE 10 MG PO TABS
10.0000 mg | ORAL_TABLET | Freq: Every day | ORAL | Status: DC
  Administered 2020-11-11: 10 mg via ORAL
  Filled 2020-11-10 (×2): qty 1

## 2020-11-10 MED ORDER — ASPIRIN 81 MG PO CHEW
81.0000 mg | CHEWABLE_TABLET | Freq: Every day | ORAL | Status: DC
  Administered 2020-11-11: 81 mg via ORAL
  Filled 2020-11-10 (×2): qty 1

## 2020-11-10 MED ORDER — PANTOPRAZOLE SODIUM 40 MG PO TBEC
40.0000 mg | DELAYED_RELEASE_TABLET | Freq: Every day | ORAL | Status: DC
  Administered 2020-11-11: 40 mg via ORAL
  Filled 2020-11-10: qty 1

## 2020-11-10 MED ORDER — APIXABAN 2.5 MG PO TABS
2.5000 mg | ORAL_TABLET | Freq: Two times a day (BID) | ORAL | Status: DC
  Administered 2020-11-10 – 2020-11-11 (×3): 2.5 mg via ORAL
  Filled 2020-11-10 (×4): qty 1

## 2020-11-10 MED ORDER — ACETAMINOPHEN 325 MG PO TABS: 650.0000 mg | ORAL_TABLET | Freq: Four times a day (QID) | ORAL | Status: DC | PRN

## 2020-11-10 MED ORDER — INSULIN ASPART 100 UNIT/ML ~~LOC~~ SOLN
0.0000 [IU] | Freq: Three times a day (TID) | SUBCUTANEOUS | Status: DC
  Administered 2020-11-11 (×2): 1 [IU] via SUBCUTANEOUS
  Filled 2020-11-10 (×2): qty 1

## 2020-11-10 MED ORDER — ALBUTEROL SULFATE HFA 108 (90 BASE) MCG/ACT IN AERS
1.0000 | INHALATION_SPRAY | RESPIRATORY_TRACT | Status: DC | PRN
  Filled 2020-11-10: qty 6.7

## 2020-11-10 MED ORDER — IRBESARTAN 150 MG PO TABS
150.0000 mg | ORAL_TABLET | Freq: Every day | ORAL | Status: DC
  Administered 2020-11-11: 150 mg via ORAL
  Filled 2020-11-10: qty 1

## 2020-11-10 NOTE — ED Triage Notes (Signed)
Pt comes into the ED via [facility] from dialysis where she was having SHOB and low O2 saturation levels at 78% RA.  Pt has O2 for PRN use at home but typically does not need to wear it.  Pt currently at 6L is sating at 96%.  Pt states this does not happen often for her.  Pt denies any CP or dizziness.  Pt was unable to complete her dialysis treatment today. Pt has even and unlabored respirations currently.

## 2020-11-10 NOTE — H&P (Signed)
History and Physical    PLEASE NOTE THAT DRAGON DICTATION SOFTWARE WAS USED IN THE CONSTRUCTION OF THIS NOTE.   KIEU QUIGGLE BBC:[id number] DOB: Mar 28, 1956 DOA: 11/10/2020  PCP: Leta Speller, MD Patient coming from: home   I have personally briefly reviewed patient's old medical records in [place]  Chief Complaint: Shortness of breath  HPI: Deborah [name] is a 64 y.o. female with medical history significant for paroxysmal atrial fibrillation chronically anticoagulated on Eliquis, chronic diastolic heart failure, glottic stenosis status post chronic tracheostomy, end-stage renal disease on hemodialysis on Monday, Wednesday, Friday schedule, hypertension, type 2 diabetes mellitus, who is admitted to [facility] on 11/10/2020 with acute hypoxic respiratory failure after presenting from home to [facility] Emergency Department complaining of shortness of breath.   The patient was recently hospitalized at [facility] from 11/01/2020 to 11/08/2020 for community-acquired pneumonia after presenting with multiple days of shortness of breath associated with productive cough and increased tracheostomy secretions.  Chest x-ray at that time showed left lower lobe infiltrate, for which the patient was treated with Rocephin and azithromycin, with subsequent improvement in her symptoms.  She is subsequently discharged with as needed supplemental oxygen in the context of no known baseline supplemental oxygen demands.  However, over the last 2 days, she reports progressive shortness of breath associated with return of productive cough and increased secretions via her tracheostomy.  She also notes associated rhinorrhea, which she does not believe was present at the time of her most recent hospitalization.  Denies any associated subjective fever, chills, rigors, or generalized myalgias.  Not associated with any chest pain, palpitations, diaphoresis, nausea or vomiting.  Not associate with any  hemoptysis, worsening peripheral edema, calf tenderness or lower extremity erythema.  Denies any associated orthopnea or PND.  No known COVID-19 exposures.  Not associated with any wheezing.   Earlier today, patient was attending her regularly scheduled hemodialysis session, at which time her oxygen saturations were noted to be in the high 70s on room air, prompting her to be sent to the emergency room for further evaluation.  Of note, she reportedly was able to complete the totality of her dialysis prior to leaving the dialysis center.    ED Course:  Vital signs in the ED were notable for the following: Temperature max 99.1; heart rate 70-79; blood pressure 138/60 - 159/71; respiratory rate 15-20; initial oxygen saturation noted to be 82% on room air, which is improved to 97% via 4 L blow-by O2 via trach collar.   Labs were notable for the following: BMP was notable for the following: Sodium 137, potassium 4.9, bicarbonate 24, glucose 189.  CBC notable for white blood cell count of 11,600, which is relative to most recent prior value of 8800 on 11/08/2020; hemoglobin 9.1 relative to most recent prior value of 9.5 on 11/08/2020.  High-sensitivity troponin x1 was found to be nonelevated at 15.  Nasopharyngeal COVID-19/influenza PCR found to be negative.  Chest x-ray, in comparison to most recent prior chest x-ray performed on 11/03/2020, showed significant improvement in previously noted left lower lobe airspace opacity, while showing minimal right basilar atelectasis, and no evidence of effusion or pneumothorax.  EKG showed normal sinus rhythm, rate 74, nonspecific T wave version V1, and no evidence of ST changes, including no evidence of ST elevation.  Subsequently, the patient was admitted to [facility] for further work-up and management of presenting acute hypoxic respiratory failure.     Review of Systems: As per HPI otherwise  10 point review of systems negative.   Past Medical History:  Diagnosis  Date  . Diabetes mellitus   . Hyperlipidemia   . Hypertension   . MI, old   . Renal disorder    dialysls    Past Surgical History:  Procedure Laterality Date  . A/V FISTULAGRAM N/A 10/05/2017   Procedure: A/V Fistulagram;  Surgeon: Katha Cabal, MD;  Location: [place] [facility];  Service: Cardiovascular;  Laterality: N/A;  . A/V SHUNTOGRAM N/A 10/05/2017   Procedure: A/V SHUNTOGRAM;  Surgeon: Katha Cabal, MD;  Location: [place] [facility];  Service: Cardiovascular;  Laterality: N/A;  . AV FISTULA PLACEMENT Left 10/02/2016   Procedure: INSERTION OF ARTERIOVENOUS (AV) GORE-TEX GRAFT ARM;  Surgeon: Waynetta Sandy, MD;  Location: [place];  Service: Vascular;  Laterality: Left;  . CARDIAC CATHETERIZATION N/A 08/15/2016   Procedure: Left Heart Cath and Coronary Angiography;  Surgeon: Yolonda Kida, MD;  Location: [place] [facility];  Service: Cardiovascular;  Laterality: N/A;  . CARDIAC CATHETERIZATION N/A 08/15/2016   Procedure: Coronary Stent Intervention;  Surgeon: Yolonda Kida, MD;  Location: [place] [facility];  Service: Cardiovascular;  Laterality: N/A;  . CARDIAC CATHETERIZATION N/A 08/18/2016   Procedure: Right Heart Cath;  Surgeon: Jolaine Artist, MD;  Location: [place] [facility];  Service: Cardiovascular;  Laterality: N/A;  . CARDIAC CATHETERIZATION N/A 08/18/2016   Procedure: IABP Insertion;  Surgeon: Jolaine Artist, MD;  Location: [place] [facility];  Service: Cardiovascular;  Laterality: N/A;  . CARDIAC CATHETERIZATION Right 08/23/2016   Procedure: CENTRAL LINE INSERTION RIGHT SUBCLAVIAN;  Surgeon: Ivin Poot, MD;  Location: [place];  Service: Open Heart Surgery;  Laterality: Right;  . ENDOVEIN HARVEST OF GREATER SAPHENOUS VEIN Left 08/23/2016   Procedure: ENDOVEIN HARVEST OF GREATER SAPHENOUS VEIN;  Surgeon: Ivin Poot, MD;  Location: [place];  Service: Open Heart Surgery;  Laterality: Left;  . INSERTION OF DIALYSIS CATHETER Bilateral  08/31/2016   Procedure: INSERTION OF DIALYSIS CATHETER LEFT INTERNAL JUGULAR VEIN & INSERTION OF TRIPLE LUMEN RIGHT INTERNAL JUGULAR VEIN;  Surgeon: Angelia Mould, MD;  Location: [place];  Service: Vascular;  Laterality: Bilateral;  . INTRAOPERATIVE TRANSESOPHAGEAL ECHOCARDIOGRAM N/A 08/23/2016   Procedure: INTRAOPERATIVE TRANSESOPHAGEAL ECHOCARDIOGRAM;  Surgeon: Ivin Poot, MD;  Location: [place];  Service: Open Heart Surgery;  Laterality: N/A;  . IR GENERIC HISTORICAL  11/13/2016   IR US GUIDE VASC ACCESS LEFT 11/13/2016 Corrie Mckusick, DO [facility]  . IR GENERIC HISTORICAL  11/13/2016   IR FLUORO GUIDE CV LINE LEFT 11/13/2016 Corrie Mckusick, DO [facility]  . IR GENERIC HISTORICAL  11/13/2016   IR GASTROSTOMY TUBE MOD SED 11/13/2016 Corrie Mckusick, DO [facility]  . MITRAL VALVE REPAIR N/A 08/23/2016   Procedure: MITRAL VALVE REPAIR (MVR) USING 25MM EDWARDS MAGNA EASE BIOPROSTHESIS MITRAL  VALVE;  Surgeon: Ivin Poot, MD;  Location: [place];  Service: Open Heart Surgery;  Laterality: N/A;  . tracheostomy reversal    . TRACHEOSTOMY TUBE PLACEMENT N/A 11/09/2016   Procedure: TRACHEOSTOMY;  Surgeon: Melissa Montane, MD;  Location: [place];  Service: ENT;  Laterality: N/A;  . VAGINAL DELIVERY     x 6    Social History:  reports that she has never smoked. She has never used smokeless tobacco. She reports that she does not drink alcohol and does not use drugs.   No Known Allergies  Family History  Problem Relation Age of Onset  .  Hypertension Mother   . Diabetes Mother   . Breast cancer Sister   . Hypertension Father      Prior to Admission medications   Medication Sig Start Date End Date Taking? Authorizing Provider  acetaminophen (TYLENOL) 325 MG tablet Take 650 mg by mouth every 6 (six) hours as needed for mild pain or fever.  04/06/17   [provider]  albuterol (PROVENTIL) (2.5 MG/3ML) 0.083% nebulizer solution Take 3 mLs (2.5 mg total) by nebulization 4 (four) times  daily - after meals and at bedtime. Patient taking differently: Take 2.5 mg by nebulization 3 (three) times daily as needed.  11/01/16   Love, Ivan Anchors, PA-C  amLODipine (NORVASC) 10 MG tablet Take 10 mg by mouth daily. 08/12/20   [provider]  apixaban (ELIQUIS) 2.5 MG TABS tablet Take 2.5 mg by mouth 2 (two) times daily.  06/22/17   [provider]  aspirin 81 MG EC tablet Take 1 tablet (81 mg total) by mouth daily. 10/24/19   Swayze, Ava, DO  atorvastatin (LIPITOR) 80 MG tablet Take 80 mg by mouth daily at 6 PM.     [provider]  carvedilol (COREG) 25 MG tablet Take 25 mg by mouth 2 (two) times daily. 04/22/18   [provider]  escitalopram (LEXAPRO) 10 MG tablet Take 10 mg by mouth daily. 09/13/20   [provider]  furosemide (LASIX) 40 MG tablet Take 40 mg by mouth daily. ON NON dialysis days, Sunday, Tuesday, Thursday, Saturday    [provider]  gabapentin (NEURONTIN) 300 MG capsule Take 1 capsule (300 mg total) by mouth at bedtime. 04/04/20   Samuella Cota, MD  LEVEMIR FLEXTOUCH 100 UNIT/ML FlexPen Inject 13 Units into the skin at bedtime. 03/05/20   [provider]  lidocaine-prilocaine (EMLA) cream Apply 1 application topically every dialysis. 09/26/19   [provider]  midodrine (PROAMATINE) 5 MG tablet Take 5 mg by mouth 3 (three) times a week.    [provider]  multivitamin (RENA-VIT) TABS tablet Take 1 tablet by mouth daily. Patient not taking: Reported on 11/01/2020 08/09/20   [provider]  olmesartan (BENICAR) 20 MG tablet Take 20 mg by mouth daily. 03/12/20   [provider]  pantoprazole (PROTONIX) 40 MG tablet Take 1 tablet (40 mg total) by mouth 2 (two) times daily. 11/01/16   Love, Ivan Anchors, PA-C  sevelamer carbonate (RENVELA) 800 MG tablet Take 1,600 mg by mouth 3 (three) times daily with meals.    [provider]  traZODone (DESYREL) 50 MG tablet Take 50 mg by  mouth 3 (three) times a week. Take on Dialysis days 04/04/18   [provider]     Objective    Physical Exam: Vitals:   11/10/20 1208 11/10/20 1503 11/10/20 1600 11/10/20 1630  BP:  (!) 155/72 (!) 167/72 (!) 164/75  Pulse:  75 73 76  Resp:  20 20 10   Temp:      TempSrc:      SpO2:  97% 100% 97%  Weight: 63.5 kg     Height: 5\' 2"  (1.575 m)       General: appears to be stated age; alert, oriented; mildly increased work of breathign Skin: warm, dry, no rash Head:  AT/[place] Mouth:  Oral mucosa membranes appear moist, normal dentition Neck: supple; trachea midline; trach collar noted. Heart:  RRR; did not appreciate any M/R/G Lungs: CTAB, did not appreciate any wheezes, rales, or rhonchi Abdomen: + BS;  soft, ND, NT Vascular: 2+ pedal pulses b/l; 2+ radial pulses b/l Extremities: no peripheral edema, no muscle wasting Neuro: strength and sensation intact in upper and lower extremities b/l   Labs on Admission: I have personally reviewed following labs and imaging studies  CBC: Recent Labs  Lab 11/04/20 0357 11/05/20 0414 11/08/20 0907 11/10/20 1309  WBC 8.2 8.4 8.8 11.6*  NEUTROABS 5.6 5.5  --   --   HGB 9.1* 8.8* 9.5* 9.1*  HCT 27.7* 26.1* 29.1* 26.1*  MCV 84.5 84.2 85.1 81.1  PLT 171 185 214 998   Basic Metabolic Panel: Recent Labs  Lab 11/04/20 0357 11/05/20 0414 11/08/20 0907 11/10/20 1309  NA 137 135 135 137  K 3.9 4.2 5.3* 4.9  CL 97* 97* 99 99  CO2 31 27 24 24   GLUCOSE 72 131* 134* 189*  BUN 28* 49* 73* 50*  CREATININE 4.89* 6.77* 8.89* 6.68*  CALCIUM 8.8* 9.0 9.2 9.2  PHOS  --   --  4.4  --    GFR: Estimated Creatinine Clearance: 7.6 mL/min (A) (by C-G formula based on SCr of 6.68 mg/dL (H)). Liver Function Tests: Recent Labs  Lab 11/04/20 0357 11/05/20 0414  AST 18 16  ALT 23 19  ALKPHOS 65 60  BILITOT 1.3* 1.1  PROT 6.5 6.4*  ALBUMIN 3.2* 3.1*   No results for input(s): LIPASE, AMYLASE in the last 168 hours. No results for  input(s): AMMONIA in the last 168 hours. Coagulation Profile: No results for input(s): INR, PROTIME in the last 168 hours. Cardiac Enzymes: No results for input(s): CKTOTAL, CKMB, CKMBINDEX, TROPONINI in the last 168 hours. BNP (last 3 results) No results for input(s): PROBNP in the last 8760 hours. HbA1C: No results for input(s): HGBA1C in the last 72 hours. CBG: Recent Labs  Lab 11/08/20 1653 11/08/20 2243 11/09/20 0758 11/09/20 1145 11/09/20 1710  GLUCAP 130* 176* 141* 144* 126*   Lipid Profile: No results for input(s): CHOL, HDL, LDLCALC, TRIG, CHOLHDL, LDLDIRECT in the last 72 hours. Thyroid Function Tests: No results for input(s): TSH, T4TOTAL, FREET4, T3FREE, THYROIDAB in the last 72 hours. Anemia Panel: No results for input(s): VITAMINB12, FOLATE, FERRITIN, TIBC, IRON, RETICCTPCT in the last 72 hours. Urine analysis:    Component Value Date/Time   COLORURINE YELLOW (A) 09/09/2017 1056   APPEARANCEUR HAZY (A) 09/09/2017 1056   LABSPEC 1.013 09/09/2017 1056   PHURINE 7.0 09/09/2017 1056   GLUCOSEU NEGATIVE 09/09/2017 1056   HGBUR NEGATIVE 09/09/2017 1056   BILIRUBINUR NEGATIVE 09/09/2017 1056   KETONESUR NEGATIVE 09/09/2017 1056   PROTEINUR 100 (A) 09/09/2017 1056   NITRITE NEGATIVE 09/09/2017 1056   LEUKOCYTESUR MODERATE (A) 09/09/2017 1056    Radiological Exams on Admission: DG Chest 2 View  Result Date: 11/10/2020 CLINICAL DATA:  Shortness of breath. EXAM: CHEST - 2 VIEW COMPARISON:  November 03, 2020. FINDINGS: Stable cardiomegaly. Status post cardiac valve repair. Tracheostomy tube is unchanged in position. No pneumothorax is noted. Left basilar opacity is significantly improved compared to prior exam. Minimal right basilar subsegmental atelectasis is noted. Bony thorax is unremarkable. IMPRESSION: Significantly improved left basilar opacity compared to prior exam. Electronically Signed   By: Marijo Conception M.D.   On: 11/10/2020 13:09     EKG: Independently  reviewed, with result as described above.    Assessment/Plan   Deborah [name] is a 65 y.o. female with medical history significant for paroxysmal atrial fibrillation chronically anticoagulated on Eliquis, chronic diastolic heart failure, glottic stenosis status  post chronic tracheostomy, end-stage renal disease on hemodialysis on Monday, Wednesday, Friday schedule, hypertension, type 2 diabetes mellitus, who is admitted to [facility] on 11/10/2020 with acute hypoxic respiratory failure after presenting from home to [facility] Emergency Department complaining of shortness of breath.    Principal Problem:   Acute respiratory failure with hypoxia ([name]) Active Problems:   Hypertension   Diabetes mellitus type 2, controlled ([name])   Hyperlipidemia   PAF (paroxysmal atrial fibrillation) ([name])   Acute bronchitis    #) Acute hypoxic respiratory failure: Diagnosis on the basis of initial oxygen saturation in the low 80s room air requiring 4 to 6 L blow-by oxygen via tracheostomy collar in order to maintain oxygen saturations in the mid 90s, all in the context of no baseline supplemental oxygen requirements.  Etiology not completely clear at the present time, as chest x-ray shows interval improvement in left lower lobe infiltrate following recent hospitalization for community-acquired pneumonia and interval completion Rocephin/azithromycin.  Furthermore chest x-ray showed no evidence of acute cardiopulmonary process, including edema, effusion, or pneumothorax.  Screening COVID-19/influenza PCR performed in the ED today was found to be negative.  To evaluate for the possibility of underlying pneumonia that was associated with a radiographic negative findings on presenting chest x-ray, will check CT of the chest.  Will refrain from additional antibiotics pending results of the study.  In the absence of any underlying chronic lung pathology, and in the absence of overt radiographic evidence of  underlying pneumonia at this time, differential also includes acute bronchitis without bronchospasm.  No clinical or radiographic evidence to suggest underlying heart failure.  Not associate with any chest pain.  ACS appears less likely, including presenting EKG showed no evidence of acute ischemic changes, while high-sensitivity troponin I was found to be nonelevated even after 2 days of shortness of breath as an outpatient.  Acute pulmonary embolism also appears less likely given the patient's reporte compliance with her chronic Eliquis in the setting of history of paroxysmal atrial fibrillation.  Plan: Scheduled duo nebulizers every 6 hours.  As needed albuterol inhaler.  Will refrain from steroids for now in the absence of any evidence of bronchospasm.  CT chest, as above.  Monitor on continuous pulse oximetry.  Monitor on telemetry.  Add on serum magnesium and phosphorus levels.  Monitor strict I's and O's.  Add on procalcitonin.  Add on BNP.       #) Paroxysmal atrial fibrillation: Documented history of such. In the setting of a CHA2DS2-VASc score of 5, there is an indication for the patient to be on chronic anticoagulation for thromboembolic prophylaxis. Consistent with this, the patient is chronically anticoagulated on Eliquis. Home AV nodal blocking regimen: Coreg.  Most recent echocardiogram was performed in November 2020 was notable for moderate LVH, LVEF 55 to 60%, no evidence of focal wall motion normalities, and showed evidence of prosthetic mitral valve. Presenting EKG shows sinus rhythm without evidence of acute ischemic changes.   Plan: monitor strict I's & O's and daily weights. Repeat BMP in the morning. Check serum magnesium level. Continue home AV nodal blocking regimen, as above.  Continue home Eliquis.     #) End-stage renal disease: On Monday, Wednesday, Friday schedule hemodialysis, with most recent session occurring earlier today.  Renal medications include Renvela.    Plan: Continue Renvela.  Monitor strict I's and O's and daily weights.  Repeat BMP in the morning.  Check serum magnesium and phosphorus levels.  Would need to consult  nephrology should be expected that the patient will remain in the hospital at the time of her upcoming scheduled hemodialysis session on Friday.     #) Essential hypertension: Outpatient and hypertensive regimen includes Norvasc, Coreg, olmesartan.  Systolic blood pressures in the ED have been in the range of the 130s to 150s, without evidence of associated hypotension.  Plan: Continue home antihypertensive medications, close monitoring of ensuing blood pressure via routine vital signs.     #) Hyperlipidemia: On atorvastatin as an outpatient.  Plan: Continue home statin.      #) Type 2 diabetes mellitus: On Levemir 13 units subcu nightly.  Presenting blood sugar per BMP noted to be 189.  Plan: We will hold basal insulin for now, with close monitoring events butchers Accu-Cheks before every meal and at bedtime with low-dose sliding scale insulin.     #) GERD: On Protonix and outpatient.  Plan: Continue home PPI.       DVT prophylaxis: Continue home Eliquis Code Status: Full code Family Communication: none Disposition Plan: Per Rounding Team Consults called: none  Admission status: Inpatient;     Of note, this patient was added by me to the following Admit List/Treatment Team:  armcadmits     PLEASE NOTE THAT DRAGON DICTATION SOFTWARE WAS USED IN THE CONSTRUCTION OF THIS NOTE.   [facility] Pager [phone] From 12PM- 12AM  Otherwise, please contact night-coverage  [email or web address] Password [email or web address]  11/10/2020, 5:30 PM

## 2020-11-10 NOTE — ED Notes (Addendum)
Pt placed on trach collar by RT 4L, per patient this is what she usually uses at home. [place] removed.

## 2020-11-10 NOTE — ED Provider Notes (Signed)
[name] ____________________________________________   First MD Initiated Contact with Patient 11/10/20 1538     (approximate)  I have reviewed the triage vital signs and the nursing notes.  HISTORY  Chief Complaint Shortness of Breath   HPI Deborah [name] is a 64 y.o. femalewho presents to the ED for evaluation of shortness of breath and hypoxia.   Chart review indicates hx ESRD on MWF iHD.  Paroxysmal A. fib on Eliquis.  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  DM 2. Recently admitted from 11/29-12/6 due to shortness of breath and increased tracheostomy secretions causing hypoxia.  Completed course of ceftriaxone and azithromycin for CAP coverage. Went home with home oxygen to use as needed. Up to 3 L.  Patient presents to the ED, with her son who provides additional history, with complaints of shortness of breath intermittently since this morning. Patient reports dyspnea on exertion this morning while at home with increased upper respiratory congestion and clear rhinorrhea. She reports the increased need for suctioning from her trach. Patient reports feeling "okay" during dialysis, but then developing shortness of breath while seated on her trach collar supplemental oxygen with sats in the 70s while at dialysis.  She reports feeling better now but still battling upper respiratory secretions and congestion. Denies pain, chest pain, pressure, syncope or subjective fevers. She only completed 1 hour of dialysis today.  Past Medical History:  Diagnosis Date  . Diabetes mellitus   . Hyperlipidemia   . Hypertension   . MI, old   . Renal disorder    dialysls    Patient Active Problem List   Diagnosis Date Noted  . Acute diastolic CHF (congestive heart failure) ([place]) 11/01/2020  . Tracheostomy status ([place]) 11/01/2020  . CAP (community acquired pneumonia) 11/01/2020  . Shortness of breath   . Fever   . Acute  respiratory failure ([place]) 04/02/2020  . Acute CHF (congestive heart failure) ([place]) 10/17/2019  . Diabetic neuropathy ([place]) 10/16/2017  . Tracheobronchitis 10/11/2017  . Hemorrhage from arteriovenous dialysis graft ([place]) 10/05/2017  . Sepsis ([place]) 09/08/2017  . Adjustment disorder with anxious mood 12/09/2016  . Bacteremia due to Staphylococcus aureus   . Staphylococcus aureus bacteremia with sepsis ([place])   . Type 2 diabetes mellitus with hyperlipidemia ([place])   . ST elevation myocardial infarction involving left main coronary artery ([place])   . Palliative care encounter   . Advance care planning   . Vocal cord dysfunction   . Palliative care by specialist   . Goals of care, counseling/discussion   . Bacteremia   . End-stage renal disease on hemodialysis ([place])   . Paroxysmal atrial fibrillation ([name])   . Heart attack ([place])   . Uncontrolled type 2 diabetes mellitus with complication, without long-term current use of insulin ([place])   . Staphylococcus aureus bacteremia 11/12/2016  . Vocal cord paralysis   . Respiratory distress 11/02/2016  . Chronic anticoagulation   . Acute lower UTI   . Labile blood glucose   . Leukocytosis   . Subtherapeutic international normalized ratio (INR)   . Supratherapeutic INR   . Cough   . Oropharyngeal dysphagia   . Uncontrolled type 2 diabetes mellitus with complication, with long-term current use of insulin ([place])   . Physical debility 10/11/2016  . Metabolic encephalopathy 11/91/4782  . Debility   . ESRD (end stage renal disease) ([place])   . Type 2 diabetes mellitus with complication, with long-term current use of insulin ([place])   .  Anemia of chronic disease   . Coronary artery disease involving native coronary artery of native heart without angina pectoris   . Acute on chronic respiratory failure with hypoxemia ([place])   . Atrial flutter ([place])   . Persistent cough   . Dysphonia   . Sleep disturbance   . Diabetes mellitus type 2 in nonobese ([name])   .  Stage 3 chronic kidney disease ([place])   . NSTEMI (non-ST elevated myocardial infarction) ([place])   . Tachypnea   . Hyponatremia   . Lymphocytosis   . Acute blood loss anemia   . Pressure injury of skin 09/18/2016  . History of tracheostomy   . Delirium   . Encounter for attention to tracheostomy ([place])   . History of MVR with cardiopulmonary bypass   . Systolic congestive heart failure ([place])   . HCAP (healthcare-associated pneumonia)   . PAF (paroxysmal atrial fibrillation) ([place])   . Acute systolic congestive heart failure ([place])   . AKI (acute kidney injury) ([place])   . Abnormal LFTs   . S/P MVR (mitral valve replacement)   . Mitral regurgitation 08/23/2016  . Acute on chronic diastolic heart failure ([place]) 08/18/2016  . Acute pulmonary edema ([name])   . Respiratory failure ([place]) 08/17/2016  . Cardiogenic shock ([place]) 08/17/2016  . Acute hypoxemic respiratory failure ([place])   . STEMI (ST elevation myocardial infarction) ([place]) 08/15/2016  . Noncompliance 01/25/2015  . Bereavement 07/09/2014  . Low back pain 02/17/2014  . Cervical cancer screening 11/20/2013  . Routine general medical examination at a health care facility 10/14/2013  . Chronic kidney disease, stage 3 ([place]) 06/14/2012  . Hypertension 03/08/2012  . Diabetes mellitus type 2, controlled ([place]) 03/08/2012  . Hyperlipidemia 03/08/2012    Past Surgical History:  Procedure Laterality Date  . A/V FISTULAGRAM N/A 10/05/2017   Procedure: A/V Fistulagram;  Surgeon: Katha Cabal, MD;  Location: [place] [facility];  Service: Cardiovascular;  Laterality: N/A;  . A/V SHUNTOGRAM N/A 10/05/2017   Procedure: A/V SHUNTOGRAM;  Surgeon: Katha Cabal, MD;  Location: [place] [facility];  Service: Cardiovascular;  Laterality: N/A;  . AV FISTULA PLACEMENT Left 10/02/2016   Procedure: INSERTION OF ARTERIOVENOUS (AV) GORE-TEX GRAFT ARM;  Surgeon: Waynetta Sandy, MD;  Location: [place];  Service: Vascular;  Laterality: Left;   . CARDIAC CATHETERIZATION N/A 08/15/2016   Procedure: Left Heart Cath and Coronary Angiography;  Surgeon: Yolonda Kida, MD;  Location: [place] [facility];  Service: Cardiovascular;  Laterality: N/A;  . CARDIAC CATHETERIZATION N/A 08/15/2016   Procedure: Coronary Stent Intervention;  Surgeon: Yolonda Kida, MD;  Location: [place] [facility];  Service: Cardiovascular;  Laterality: N/A;  . CARDIAC CATHETERIZATION N/A 08/18/2016   Procedure: Right Heart Cath;  Surgeon: Jolaine Artist, MD;  Location: [place] [facility];  Service: Cardiovascular;  Laterality: N/A;  . CARDIAC CATHETERIZATION N/A 08/18/2016   Procedure: IABP Insertion;  Surgeon: Jolaine Artist, MD;  Location: [place] [facility];  Service: Cardiovascular;  Laterality: N/A;  . CARDIAC CATHETERIZATION Right 08/23/2016   Procedure: CENTRAL LINE INSERTION RIGHT SUBCLAVIAN;  Surgeon: Ivin Poot, MD;  Location: [place];  Service: Open Heart Surgery;  Laterality: Right;  . ENDOVEIN HARVEST OF GREATER SAPHENOUS VEIN Left 08/23/2016   Procedure: ENDOVEIN HARVEST OF GREATER SAPHENOUS VEIN;  Surgeon: Ivin Poot, MD;  Location: [place];  Service: Open Heart Surgery;  Laterality: Left;  . INSERTION OF DIALYSIS CATHETER Bilateral 08/31/2016   Procedure:  INSERTION OF DIALYSIS CATHETER LEFT INTERNAL JUGULAR VEIN & INSERTION OF TRIPLE LUMEN RIGHT INTERNAL JUGULAR VEIN;  Surgeon: Angelia Mould, MD;  Location: [place];  Service: Vascular;  Laterality: Bilateral;  . INTRAOPERATIVE TRANSESOPHAGEAL ECHOCARDIOGRAM N/A 08/23/2016   Procedure: INTRAOPERATIVE TRANSESOPHAGEAL ECHOCARDIOGRAM;  Surgeon: Ivin Poot, MD;  Location: [place];  Service: Open Heart Surgery;  Laterality: N/A;  . IR GENERIC HISTORICAL  11/13/2016   IR US GUIDE VASC ACCESS LEFT 11/13/2016 Corrie Mckusick, DO [facility]  . IR GENERIC HISTORICAL  11/13/2016   IR FLUORO GUIDE CV LINE LEFT 11/13/2016 Corrie Mckusick, DO [facility]  . IR GENERIC HISTORICAL  11/13/2016    IR GASTROSTOMY TUBE MOD SED 11/13/2016 Corrie Mckusick, DO [facility]  . MITRAL VALVE REPAIR N/A 08/23/2016   Procedure: MITRAL VALVE REPAIR (MVR) USING 25MM EDWARDS MAGNA EASE BIOPROSTHESIS MITRAL  VALVE;  Surgeon: Ivin Poot, MD;  Location: [place];  Service: Open Heart Surgery;  Laterality: N/A;  . tracheostomy reversal    . TRACHEOSTOMY TUBE PLACEMENT N/A 11/09/2016   Procedure: TRACHEOSTOMY;  Surgeon: Melissa Montane, MD;  Location: [place];  Service: ENT;  Laterality: N/A;  . VAGINAL DELIVERY     x 6    Prior to Admission medications   Medication Sig Start Date End Date Taking? Authorizing Provider  acetaminophen (TYLENOL) 325 MG tablet Take 650 mg by mouth every 6 (six) hours as needed for mild pain or fever.  04/06/17   [provider]  albuterol (PROVENTIL) (2.5 MG/3ML) 0.083% nebulizer solution Take 3 mLs (2.5 mg total) by nebulization 4 (four) times daily - after meals and at bedtime. Patient taking differently: Take 2.5 mg by nebulization 3 (three) times daily as needed.  11/01/16   Love, Ivan Anchors, PA-C  amLODipine (NORVASC) 10 MG tablet Take 10 mg by mouth daily. 08/12/20   [provider]  apixaban (ELIQUIS) 2.5 MG TABS tablet Take 2.5 mg by mouth 2 (two) times daily.  06/22/17   [provider]  aspirin 81 MG EC tablet Take 1 tablet (81 mg total) by mouth daily. 10/24/19   Swayze, Ava, DO  atorvastatin (LIPITOR) 80 MG tablet Take 80 mg by mouth daily at 6 PM.     [provider]  carvedilol (COREG) 25 MG tablet Take 25 mg by mouth 2 (two) times daily. 04/22/18   [provider]  escitalopram (LEXAPRO) 10 MG tablet Take 10 mg by mouth daily. 09/13/20   [provider]  furosemide (LASIX) 40 MG tablet Take 40 mg by mouth daily. ON NON dialysis days, Sunday, Tuesday, Thursday, Saturday    [provider]  gabapentin (NEURONTIN) 300 MG capsule Take 1 capsule (300 mg total) by mouth at bedtime. 04/04/20   Samuella Cota, MD   LEVEMIR FLEXTOUCH 100 UNIT/ML FlexPen Inject 13 Units into the skin at bedtime. 03/05/20   [provider]  lidocaine-prilocaine (EMLA) cream Apply 1 application topically every dialysis. 09/26/19   [provider]  midodrine (PROAMATINE) 5 MG tablet Take 5 mg by mouth 3 (three) times a week.    [provider]  multivitamin (RENA-VIT) TABS tablet Take 1 tablet by mouth daily. Patient not taking: Reported on 11/01/2020 08/09/20   [provider]  olmesartan (BENICAR) 20 MG tablet Take 20 mg by mouth daily. 03/12/20   [provider]  pantoprazole (PROTONIX) 40 MG tablet Take 1 tablet (40 mg total) by mouth 2 (two) times daily. 11/01/16   Reesa Chew  S, PA-C  sevelamer carbonate (RENVELA) 800 MG tablet Take 1,600 mg by mouth 3 (three) times daily with meals.    [provider]  traZODone (DESYREL) 50 MG tablet Take 50 mg by mouth 3 (three) times a week. Take on Dialysis days 04/04/18   [provider]    Allergies Patient has no known allergies.  Family History  Problem Relation Age of Onset  . Hypertension Mother   . Diabetes Mother   . Breast cancer Sister   . Hypertension Father     Social History Social History   Tobacco Use  . Smoking status: Never Smoker  . Smokeless tobacco: Never Used  Substance Use Topics  . Alcohol use: No  . Drug use: No    Review of Systems  Constitutional: No fever/chills Eyes: No visual changes. ENT: No sore throat. Cardiovascular: Denies chest pain. Respiratory: Positive for upper respiratory congestion and shortness of breath. Gastrointestinal: No abdominal pain.  No nausea, no vomiting.  No diarrhea.  No constipation. Genitourinary: Negative for dysuria. Musculoskeletal: Negative for back pain. Skin: Negative for rash. Neurological: Negative for headaches, focal weakness or numbness.  ____________________________________________   PHYSICAL EXAM:  VITAL SIGNS: Vitals:    11/10/20 1600 11/10/20 1630  BP: (!) 167/72 (!) 164/75  Pulse: 73 76  Resp: 20 10  Temp:    SpO2: 100% 97%     Constitutional: Alert and oriented. Well appearing and in no acute distress. Conversational in full sentences when she self occludes her tracheostomy. Eyes: Conjunctivae are normal. PERRL. EOMI. Head: Atraumatic. Nose: No congestion/rhinnorhea. Trach in place without erythema, purulence. Mouth/Throat: Mucous membranes are moist.  Oropharynx non-erythematous. Neck: No stridor. No cervical spine tenderness to palpation. Cardiovascular: Normal rate, regular rhythm. Grossly normal heart sounds.  Good peripheral circulation. Respiratory: Mild tachypnea to the low 20s. No retractions. Lungs CTAB. Slightly diminished breath sounds in the left base. Gastrointestinal: Soft , nondistended, nontender to palpation. No CVA tenderness. Musculoskeletal: No lower extremity tenderness nor edema.  No joint effusions. No signs of acute trauma. Neurologic:  Normal speech and language. No gross focal neurologic deficits are appreciated. No gait instability noted. Skin:  Skin is warm, dry and intact. No rash noted. Psychiatric: Mood and affect are normal. Speech and behavior are normal.  ____________________________________________   LABS (all labs ordered are listed, but only abnormal results are displayed)  Labs Reviewed  BASIC METABOLIC PANEL - Abnormal; Notable for the following components:      Result Value   Glucose, Bld 189 (*)    BUN 50 (*)    Creatinine, Ser 6.68 (*)    GFR, Estimated 6 (*)    All other components within normal limits  CBC - Abnormal; Notable for the following components:   WBC 11.6 (*)    RBC 3.22 (*)    Hemoglobin 9.1 (*)    HCT 26.1 (*)    RDW 17.2 (*)    All other components within normal limits  RESP PANEL BY RT-PCR (FLU A&B, COVID) ARPGX2  TROPONIN I (HIGH SENSITIVITY)   ____________________________________________  12 Lead EKG  Sinus rhythm, rate  of 74 bpm.  Normal axis.  Sub-1 mm ST depressions to inferior leads noted, no further evidence of acute ischemia or STEMI criteria ____________________________________________  RADIOLOGY  ED MD interpretation: 2 view CXR reviewed by me with cardiomegaly, tracheostomy tube in place with improving left basilar pulmonary opacity.  Official radiology report(s): DG Chest 2 View  Result Date: 11/10/2020 CLINICAL DATA:  Shortness of breath. EXAM: CHEST - 2 VIEW COMPARISON:  November 03, 2020. FINDINGS: Stable cardiomegaly. Status post cardiac valve repair. Tracheostomy tube is unchanged in position. No pneumothorax is noted. Left basilar opacity is significantly improved compared to prior exam. Minimal right basilar subsegmental atelectasis is noted. Bony thorax is unremarkable. IMPRESSION: Significantly improved left basilar opacity compared to prior exam. Electronically Signed   By: Marijo Conception M.D.   On: 11/10/2020 13:09    ____________________________________________   PROCEDURES and INTERVENTIONS  Procedure(s) performed (including Critical Care):  .1-3 Lead EKG Interpretation Performed by: Vladimir Crofts, MD Authorized by: Vladimir Crofts, MD     Interpretation: normal     ECG rate:  76   ECG rate assessment: normal     Rhythm: sinus rhythm     Ectopy: none     Conduction: normal    Ultrasound ED Peripheral IV (Provider)  Date/Time: 11/10/2020 5:45 PM Performed by: Vladimir Crofts, MD Authorized by: Vladimir Crofts, MD   Procedure details:    Indications: hydration, multiple failed IV attempts and poor IV access     Skin Prep: chlorhexidine gluconate     Location: right basilic v.   Angiocath:  20 G   Bedside Ultrasound Guided: Yes     Images: not archived     Patient tolerated procedure without complications: Yes     Dressing applied: Yes      Medications - No data to display  ____________________________________________   MDM / ED COURSE   64 year old trach and  hemodialysis dependent patient presents to the ED with intermittent hypoxia and shortness of breath, likely due to upper respiratory congestion and mucus plugging, requiring observation medical admission.  Intermittent hypoxia to the 80s on her trach collar, otherwise normal vitals.  Patient responds well to suctioning and clinically improves and with improved symptoms thereafter.  Unfortunately, patient has recurrent episodes of hypoxia despite supplemental O2, likely due to recurrent mucous plugging and need for recurrent suctioning.  She reports uncomfortable performing this at home, and to the frequency seems to be necessary, think is reasonable to admit the patient to observation medicine to facilitate this.  Her CXR is improved from her recent admission with her clearing pneumonia, she has no further evidence of additional acute pathology such as pneumonia to require admission or further antibiotics.  We will admit to hospitalist medicine for further work-up and management.   Clinical Course as of Nov 10 1744  Wed Nov 10, 2020  1610 RT at the bedside providing suctioning.    [DS]  1648 Reevaluated after suctioning.  Improved airflow throughout continues to have clear breath sounds.  She looks well clinically and remains on her home O2 and says she feels fine.  We discussed the possibility of outpatient management.  They report having supplemental oxygen and suctioning capabilities at home.  Patient and son report feeling comfortable performing these tasks at home.   [DS]  6767 Went to reassess the patient.  Patient is on supplemental O2 at 5 L trach collar, satting 86%.  I wake her up and her sats improve with deep breaths, she reports a sensation of need for additional suctioning.   [DS]  1715 We discussed my recommendation for medical observation admission.  Patient and son agreeable.   [DS]  2094 USIV placed by me   [DS]    Clinical Course User Index [DS] Vladimir Crofts, MD     ____________________________________________   FINAL CLINICAL IMPRESSION(S) / ED DIAGNOSES  Final diagnoses:  Tracheostomy dependent ([name])  Increased oropharyngeal secretions  Increased tracheal secretions  Hypoxia  SOB (shortness of breath)     ED Discharge Orders    None       Antawn Sison   Note:  This document was prepared using Set designer software and may include unintentional dictation errors.   Vladimir Crofts, MD 11/10/20 [id number]

## 2020-11-10 NOTE — ED Triage Notes (Signed)
First RN Note: pt to ED via [facility] from dialysis, per EMS pt did not finish dialysis treatment today. Per EMS pt sent home with intermittent O2 to be used at home at 3L. Per EMS 100% on 3L via [place]. On arrival to ED pt noted to 82% on RA, pt placed on 3L via [place].

## 2020-11-10 NOTE — ED Notes (Signed)
ED Provider at bedside. 

## 2020-11-11 LAB — CBC WITH DIFFERENTIAL/PLATELET
Abs Immature Granulocytes: 0.02 10*3/uL (ref 0.00–0.07)
Basophils Absolute: 0.1 10*3/uL (ref 0.0–0.1)
Basophils Relative: 1 %
Eosinophils Absolute: 0.4 10*3/uL (ref 0.0–0.5)
Eosinophils Relative: 5 %
HCT: 23.8 % — ABNORMAL LOW (ref 36.0–46.0)
Hemoglobin: 7.9 g/dL — ABNORMAL LOW (ref 12.0–15.0)
Immature Granulocytes: 0 %
Lymphocytes Relative: 12 %
Lymphs Abs: 1.1 10*3/uL (ref 0.7–4.0)
MCH: 27.9 pg (ref 26.0–34.0)
MCHC: 33.2 g/dL (ref 30.0–36.0)
MCV: 84.1 fL (ref 80.0–100.0)
Monocytes Absolute: 1 10*3/uL (ref 0.1–1.0)
Monocytes Relative: 11 %
Neutro Abs: 6.7 10*3/uL (ref 1.7–7.7)
Neutrophils Relative %: 71 %
Platelets: 202 10*3/uL (ref 150–400)
RBC: 2.83 MIL/uL — ABNORMAL LOW (ref 3.87–5.11)
RDW: 17.2 % — ABNORMAL HIGH (ref 11.5–15.5)
WBC: 9.3 10*3/uL (ref 4.0–10.5)
nRBC: 0 % (ref 0.0–0.2)

## 2020-11-11 LAB — COMPREHENSIVE METABOLIC PANEL
ALT: 21 U/L (ref 0–44)
AST: 16 U/L (ref 15–41)
Albumin: 3.2 g/dL — ABNORMAL LOW (ref 3.5–5.0)
Alkaline Phosphatase: 68 U/L (ref 38–126)
Anion gap: 11 (ref 5–15)
BUN: 61 mg/dL — ABNORMAL HIGH (ref 8–23)
CO2: 27 mmol/L (ref 22–32)
Calcium: 9.1 mg/dL (ref 8.9–10.3)
Chloride: 100 mmol/L (ref 98–111)
Creatinine, Ser: 8.02 mg/dL — ABNORMAL HIGH (ref 0.44–1.00)
GFR, Estimated: 5 mL/min — ABNORMAL LOW (ref >60)
Glucose, Bld: 199 mg/dL — ABNORMAL HIGH (ref 70–99)
Potassium: 4.4 mmol/L (ref 3.5–5.1)
Sodium: 138 mmol/L (ref 135–145)
Total Bilirubin: 1.4 mg/dL — ABNORMAL HIGH (ref 0.3–1.2)
Total Protein: 6.9 g/dL (ref 6.5–8.1)

## 2020-11-11 LAB — RESPIRATORY PANEL BY PCR

## 2020-11-11 LAB — GLUCOSE, CAPILLARY
Glucose-Capillary: 149 mg/dL — ABNORMAL HIGH (ref 70–99)
Glucose-Capillary: 168 mg/dL — ABNORMAL HIGH (ref 70–99)
Glucose-Capillary: 181 mg/dL — ABNORMAL HIGH (ref 70–99)
Glucose-Capillary: 243 mg/dL — ABNORMAL HIGH (ref 70–99)

## 2020-11-11 LAB — MAGNESIUM: Magnesium: 1.9 mg/dL (ref 1.7–2.4)

## 2020-11-11 LAB — MRSA PCR SCREENING: MRSA by PCR: NEGATIVE

## 2020-11-11 LAB — PHOSPHORUS: Phosphorus: 4.6 mg/dL (ref 2.5–4.6)

## 2020-11-11 MED ORDER — SODIUM CHLORIDE 0.9 % IV SOLN
2.0000 g | Freq: Once | INTRAVENOUS | Status: AC
  Administered 2020-11-11: 2 g via INTRAVENOUS
  Filled 2020-11-11: qty 2

## 2020-11-11 MED ORDER — IPRATROPIUM-ALBUTEROL 0.5-2.5 (3) MG/3ML IN SOLN: 3.0000 mL | Freq: Four times a day (QID) | RESPIRATORY_TRACT | Status: DC | PRN

## 2020-11-11 MED ORDER — SODIUM CHLORIDE 0.9 % IV SOLN
2.0000 g | INTRAVENOUS | Status: DC
  Filled 2020-11-11: qty 2

## 2020-11-11 MED ORDER — VANCOMYCIN HCL 1250 MG/250ML IV SOLN
1250.0000 mg | Freq: Once | INTRAVENOUS | Status: AC
  Administered 2020-11-11: 1250 mg via INTRAVENOUS
  Filled 2020-11-11: qty 250

## 2020-11-11 MED ORDER — VANCOMYCIN VARIABLE DOSE PER UNSTABLE RENAL FUNCTION (PHARMACIST DOSING): Status: DC

## 2020-11-11 NOTE — Progress Notes (Signed)
PROGRESS NOTE    Deborah [name]  ZOX:[id number] DOB: Jul 14, 1956 DOA: 11/10/2020 PCP: Leta Speller, MD   Brief Narrative:  HPI: Deborah [name] is a 64 y.o. female with medical history significant for paroxysmal atrial fibrillation chronically anticoagulated on Eliquis, chronic diastolic heart failure, glottic stenosis status post chronic tracheostomy, end-stage renal disease on hemodialysis on Monday, Wednesday, Friday schedule, hypertension, type 2 diabetes mellitus, who is admitted to [facility] on 11/10/2020 with acute hypoxic respiratory failure after presenting from home to [facility] Emergency Department complaining of shortness of breath.   The patient was recently hospitalized at [facility] from 11/01/2020 to 11/08/2020 for community-acquired pneumonia after presenting with multiple days of shortness of breath associated with productive cough and increased tracheostomy secretions.  Chest x-ray at that time showed left lower lobe infiltrate, for which the patient was treated with Rocephin and azithromycin, with subsequent improvement in her symptoms.  She is subsequently discharged with as needed supplemental oxygen in the context of no known baseline supplemental oxygen demands.  However, over the last 2 days, she reports progressive shortness of breath associated with return of productive cough and increased secretions via her tracheostomy.  She also notes associated rhinorrhea, which she does not believe was present at the time of her most recent hospitalization.  Denies any associated subjective fever, chills, rigors, or generalized myalgias.  Not associated with any chest pain, palpitations, diaphoresis, nausea or vomiting.  Not associate with any hemoptysis, worsening peripheral edema, calf tenderness or lower extremity erythema.  Denies any associated orthopnea or PND.  No known COVID-19 exposures.  Not associated with any wheezing.   Earlier today, patient was attending  her regularly scheduled hemodialysis session, at which time her oxygen saturations were noted to be in the high 70s on room air, prompting her to be sent to the emergency room for further evaluation.  Of note, she reportedly was able to complete the totality of her dialysis prior to leaving the dialysis center.    ED Course:  Vital signs in the ED were notable for the following: Temperature max 99.1; heart rate 70-79; blood pressure 138/60 - 159/71; respiratory rate 15-20; initial oxygen saturation noted to be 82% on room air, which is improved to 97% via 4 L blow-by O2 via trach collar.   Labs were notable for the following: BMP was notable for the following: Sodium 137, potassium 4.9, bicarbonate 24, glucose 189.  CBC notable for white blood cell count of 11,600, which is relative to most recent prior value of 8800 on 11/08/2020; hemoglobin 9.1 relative to most recent prior value of 9.5 on 11/08/2020.  High-sensitivity troponin x1 was found to be nonelevated at 15.  Nasopharyngeal COVID-19/influenza PCR found to be negative.  Chest x-ray, in comparison to most recent prior chest x-ray performed on 11/03/2020, showed significant improvement in previously noted left lower lobe airspace opacity, while showing minimal right basilar atelectasis, and no evidence of effusion or pneumothorax.  EKG showed normal sinus rhythm, rate 74, nonspecific T wave version V1, and no evidence of ST changes, including no evidence of ST elevation.  Subsequently, the patient was admitted to [facility] for further work-up and management of presenting acute hypoxic respiratory failure.    Assessment & Plan:   Principal Problem:   Acute respiratory failure with hypoxia ([name]) Active Problems:   Hypertension   Diabetes mellitus type 2, controlled ([name])   Hyperlipidemia   PAF (paroxysmal atrial fibrillation) ([name])   Acute bronchitis   #) Acute  on chronic hypoxic respiratory failure: Patient was recently discharged from  hospital on 11/08/2020 after being diagnosed and treated for pneumonia with IV antibiotics here.  She was discharged on 3 to 4 L of nasal oxygen.  Was requiring about 5 to 6 L of oxygen at the time of admission on 11/10/2020. CT chest indicates possible bilateral lower lobe and left upper lobe infiltrate.  Mild leukocytosis upon presentation but afebrile.  Currently on 5 L of oxygen.  Picture not very impressive for pneumonia however cannot rule out possibility of pneumonia with confidence either.Patient received cefepime and vancomycin.  We will continue cefepime.  Procalcitonin also slightly elevated but flat which is not much revealing in the face of the patient who has ESRD and is on HD.  Prior to wean oxygen.  Been tested negative for influenza and COVID-19.  Will test for respiratory viral panel.  We will also check for sputum culture, urine antigen for Legionella and streptococci.  Continue cefepime.  #) Paroxysmal atrial fibrillation: Most recent echocardiogram was performed in November 2020 was notable for moderate LVH, LVEF 55 to 60%, no evidence of focal wall motion normalities, and showed evidence of prosthetic mitral valve.  This is controlled.  Continue carvedilol and Eliquis.  #) End-stage renal disease: On Monday, Wednesday, Friday schedule hemodialysis.  Consulted nephrology.  #)Essential hypertension: Blood pressure fairly controlled.  Continue home medications which include Coreg, Norvasc and olmesartan.  #) Hyperlipidemia: Continue atorvastatin.  #) Type 2 diabetes mellitus: Hemoglobin A1c recently on 11/01/2020 was 7.2.  On Levemir 13 units subcu nightly.  Currently on SSI and blood sugar controlled.  #) GERD: Continue Protonix.  DVT prophylaxis: apixaban (ELIQUIS) tablet 2.5 mg Start: 11/10/20 2200   Code Status: Full Code  Family Communication:  None present at bedside.  Plan of care discussed with patient in length and he verbalized understanding and agreed with  it.  Status is: Inpatient  Remains inpatient appropriate because:Inpatient level of care appropriate due to severity of illness   Dispo: The patient is from: Home              Anticipated d/c is to: Home              Anticipated d/c date is: 1 day              Patient currently is not medically stable to d/c.        Estimated body mass index is 25.06 kg/m as calculated from the following:   Height as of this encounter: 5\' 2"  (1.575 m).   Weight as of this encounter: 62.1 kg.      Nutritional status:               Consultants:   Nephrology  Procedures:   None  Antimicrobials:  Anti-infectives (From admission, onward)   Start     Dose/Rate Route Frequency Ordered Stop   11/12/20 1200  ceFEPIme (MAXIPIME) 2 g in sodium chloride 0.9 % 100 mL IVPB        2 g 200 mL/hr over 30 Minutes Intravenous Every M-W-F (Hemodialysis) 11/11/20 0841     11/11/20 0530  vancomycin (VANCOREADY) IVPB 1250 mg/250 mL        1,250 mg 166.7 mL/hr over 90 Minutes Intravenous  Once 11/11/20 0339 11/11/20 0738   11/11/20 0430  ceFEPIme (MAXIPIME) 2 g in sodium chloride 0.9 % 100 mL IVPB        2 g 200 mL/hr over 30 Minutes Intravenous  Once 11/11/20 0339 11/11/20 0602   11/11/20 0340  vancomycin variable dose per unstable renal function (pharmacist dosing)  Status:  Discontinued         Does not apply See admin instructions 11/11/20 0340 11/11/20 0834         Subjective: Seen and examined.  Feels much better.  No shortness of breath.  Some productive cough.  No other complaint.  Objective: Vitals:   11/11/20 0554 11/11/20 0742 11/11/20 0858 11/11/20 1144  BP: (!) 150/57  (!) 159/67 (!) 148/59  Pulse: 68  69 68  Resp:   19 18  Temp: 98.4 F (36.9 C)  98 F (36.7 C) 98 F (36.7 C)  TempSrc: Oral  Oral Oral  SpO2: 93% 97% 93% 95%  Weight:      Height:        Intake/Output Summary (Last 24 hours) at 11/11/2020 1245 Last data filed at 11/11/2020 1142 Gross per 24 hour   Intake 240 ml  Output 0 ml  Net 240 ml   Filed Weights   11/10/20 1208 11/11/20 0227  Weight: 63.5 kg 62.1 kg    Examination:  General exam: Appears calm and comfortable, with tracheostomy Respiratory system: Clear to auscultation. Respiratory effort normal. Cardiovascular system: S1 & S2 heard, RRR. No JVD, murmurs, rubs, gallops or clicks. No pedal edema. Gastrointestinal system: Abdomen is nondistended, soft and nontender. No organomegaly or masses felt. Normal bowel sounds heard. Central nervous system: Alert and oriented. No focal neurological deficits. Extremities: Symmetric 5 x 5 power. Skin: No rashes, lesions or ulcers Psychiatry: Judgement and insight appear normal. Mood & affect appropriate.    Data Reviewed: I have personally reviewed following labs and imaging studies  CBC: Recent Labs  Lab 11/05/20 0414 11/08/20 0907 11/10/20 1309 11/11/20 0351  WBC 8.4 8.8 11.6* 9.3  NEUTROABS 5.5  --   --  6.7  HGB 8.8* 9.5* 9.1* 7.9*  HCT 26.1* 29.1* 26.1* 23.8*  MCV 84.2 85.1 81.1 84.1  PLT 185 214 198 357   Basic Metabolic Panel: Recent Labs  Lab 11/05/20 0414 11/08/20 0907 11/10/20 1309 11/11/20 0351  NA 135 135 137 138  K 4.2 5.3* 4.9 4.4  CL 97* 99 99 100  CO2 27 24 24 27   GLUCOSE 131* 134* 189* 199*  BUN 49* 73* 50* 61*  CREATININE 6.77* 8.89* 6.68* 8.02*  CALCIUM 9.0 9.2 9.2 9.1  MG  --   --   --  1.9  PHOS  --  4.4  --  4.6   GFR: Estimated Creatinine Clearance: 6.2 mL/min (A) (by C-G formula based on SCr of 8.02 mg/dL (H)). Liver Function Tests: Recent Labs  Lab 11/05/20 0414 11/11/20 0351  AST 16 16  ALT 19 21  ALKPHOS 60 68  BILITOT 1.1 1.4*  PROT 6.4* 6.9  ALBUMIN 3.1* 3.2*   No results for input(s): LIPASE, AMYLASE in the last 168 hours. No results for input(s): AMMONIA in the last 168 hours. Coagulation Profile: No results for input(s): INR, PROTIME in the last 168 hours. Cardiac Enzymes: No results for input(s): CKTOTAL, CKMB,  CKMBINDEX, TROPONINI in the last 168 hours. BNP (last 3 results) No results for input(s): PROBNP in the last 8760 hours. HbA1C: No results for input(s): HGBA1C in the last 72 hours. CBG: Recent Labs  Lab 11/09/20 0758 11/09/20 1145 11/09/20 1710 11/11/20 0857 11/11/20 1141  GLUCAP 141* 144* 126* 149* 168*   Lipid Profile: No results for input(s): CHOL, HDL, LDLCALC, TRIG,  CHOLHDL, LDLDIRECT in the last 72 hours. Thyroid Function Tests: No results for input(s): TSH, T4TOTAL, FREET4, T3FREE, THYROIDAB in the last 72 hours. Anemia Panel: No results for input(s): VITAMINB12, FOLATE, FERRITIN, TIBC, IRON, RETICCTPCT in the last 72 hours. Sepsis Labs: Recent Labs  Lab 11/10/20 1642  PROCALCITON 0.31    Recent Results (from the past 240 hour(s))  MRSA PCR Screening     Status: None   Collection Time: 11/02/20 12:00 AM   Specimen: Nasopharyngeal  Result Value Ref Range Status   MRSA by PCR NEGATIVE NEGATIVE Final    Comment:        The GeneXpert MRSA Assay (FDA approved for NASAL specimens only), is one component of a comprehensive MRSA colonization surveillance program. It is not intended to diagnose MRSA infection nor to guide or monitor treatment for MRSA infections. Performed at [facility], [address] [place], [place] [address]   Resp Panel by RT-PCR (Flu A&B, Covid) Nasopharyngeal Swab     Status: None   Collection Time: 11/10/20  3:55 PM   Specimen: Nasopharyngeal Swab; Nasopharyngeal(NP) swabs in vial transport medium  Result Value Ref Range Status   SARS Coronavirus 2 by RT PCR NEGATIVE NEGATIVE Final    Comment: (NOTE) SARS-CoV-2 target nucleic acids are NOT DETECTED.  The SARS-CoV-2 RNA is generally detectable in upper respiratory specimens during the acute phase of infection. The lowest concentration of SARS-CoV-2 viral copies this assay can detect is 138 copies/mL. A negative result does not preclude SARS-Cov-2 infection and should not be  used as the sole basis for treatment or other patient management decisions. A negative result may occur with  improper specimen collection/handling, submission of specimen other than nasopharyngeal swab, presence of viral mutation(s) within the areas targeted by this assay, and inadequate number of viral copies(<138 copies/mL). A negative result must be combined with clinical observations, patient history, and epidemiological information. The expected result is Negative.  Fact Sheet for Patients:  EntrepreneurPulse.com.au  Fact Sheet for Healthcare Providers:  IncredibleEmployment.be  This test is no t yet approved or cleared by the [place] FDA and  has been authorized for detection and/or diagnosis of SARS-CoV-2 by FDA under an Emergency Use Authorization (EUA). This EUA will remain  in effect (meaning this test can be used) for the duration of the COVID-19 declaration under Section 564(b)(1) of the Act, 21 U.S.C.section 360bbb-3(b)(1), unless the authorization is terminated  or revoked sooner.       Influenza A by PCR NEGATIVE NEGATIVE Final   Influenza B by PCR NEGATIVE NEGATIVE Final    Comment: (NOTE) The Xpert Xpress SARS-CoV-2/FLU/RSV plus assay is intended as an aid in the diagnosis of influenza from Nasopharyngeal swab specimens and should not be used as a sole basis for treatment. Nasal washings and aspirates are unacceptable for Xpert Xpress SARS-CoV-2/FLU/RSV testing.  Fact Sheet for Patients: EntrepreneurPulse.com.au  Fact Sheet for Healthcare Providers: IncredibleEmployment.be  This test is not yet approved or cleared by the [place] FDA and has been authorized for detection and/or diagnosis of SARS-CoV-2 by FDA under an Emergency Use Authorization (EUA). This EUA will remain in effect (meaning this test can be used) for the duration of the COVID-19 declaration under Section  564(b)(1) of the Act, 21 U.S.C. section 360bbb-3(b)(1), unless the authorization is terminated or revoked.  Performed at [facility], [address] [place],  [address]   Culture, blood (Routine X 2) w Reflex to ID Panel     Status:  None (Preliminary result)   Collection Time: 11/11/20  3:51 AM   Specimen: BLOOD  Result Value Ref Range Status   Specimen Description BLOOD RIGHT ASSIST CONTROL  Final   Special Requests   Final    BOTTLES DRAWN AEROBIC AND ANAEROBIC Blood Culture results may not be optimal due to an excessive volume of blood received in culture bottles   Culture   Final    NO GROWTH < 12 HOURS Performed at [facility], [address], [place], [place] [address]    Report Status PENDING  Incomplete  MRSA PCR Screening     Status: None   Collection Time: 11/11/20  4:10 AM   Specimen: Nasal Mucosa; Nasopharyngeal  Result Value Ref Range Status   MRSA by PCR NEGATIVE NEGATIVE Final    Comment:        The GeneXpert MRSA Assay (FDA approved for NASAL specimens only), is one component of a comprehensive MRSA colonization surveillance program. It is not intended to diagnose MRSA infection nor to guide or monitor treatment for MRSA infections. Performed at [facility], [address] [place], [place] [address]   Culture, blood (Routine X 2) w Reflex to ID Panel     Status: None (Preliminary result)   Collection Time: 11/11/20  5:03 AM   Specimen: BLOOD  Result Value Ref Range Status   Specimen Description BLOOD RIGHT HAND  Final   Special Requests   Final    BOTTLES DRAWN AEROBIC AND ANAEROBIC Blood Culture results may not be optimal due to an excessive volume of blood received in culture bottles   Culture   Final    NO GROWTH < 12 HOURS Performed at [facility], [address] [place], [place] [address]    Report Status PENDING  Incomplete      Radiology Studies: DG Chest 2 View  Result Date:  11/10/2020 CLINICAL DATA:  Shortness of breath. EXAM: CHEST - 2 VIEW COMPARISON:  November 03, 2020. FINDINGS: Stable cardiomegaly. Status post cardiac valve repair. Tracheostomy tube is unchanged in position. No pneumothorax is noted. Left basilar opacity is significantly improved compared to prior exam. Minimal right basilar subsegmental atelectasis is noted. Bony thorax is unremarkable. IMPRESSION: Significantly improved left basilar opacity compared to prior exam. Electronically Signed   By: Marijo Conception M.D.   On: 11/10/2020 13:09   CT CHEST WO CONTRAST  Result Date: 11/10/2020 CLINICAL DATA:  Decreased oxygen saturation. EXAM: CT CHEST WITHOUT CONTRAST TECHNIQUE: Multidetector CT imaging of the chest was performed following the standard protocol without IV contrast. COMPARISON:  June 25, 2020 FINDINGS: Cardiovascular: There is mild calcification of the aortic arch. Stable enlargement of the main pulmonary artery is noted (approximately 3.8 cm). Of the there is marked severity cardiomegaly with marked severity coronary artery calcification. 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 No pericardial effusion. Mediastinum/Nodes: Multiple sternal wires are seen. Mild pretracheal lymphadenopathy is noted. There is stable tracheostomy tube positioning. The thyroid gland and esophagus demonstrate no significant findings. Lungs/Pleura: Moderate severity areas of atelectasis and/or infiltrate are seen within the posterior aspect of the bilateral lower lobes and inferior aspect of the left upper lobe. Mild involvement of the posterior aspect of the right upper lobe is also seen. Very small bilateral pleural effusions are seen. No pneumothorax is identified. Upper Abdomen: Punctate calcified granulomas are seen scattered throughout the spleen. Musculoskeletal: Multilevel degenerative changes seen throughout the thoracic spine. IMPRESSION: 1. Moderate severity bilateral lower lobe and left upper  lobe atelectasis and/or  infiltrate with mild involvement of the posterior aspect of the right upper lobe. 2. Very small bilateral pleural effusions. 3. Marked severity cardiomegaly with an artificial mitral valve. 4. Stable enlargement of the main pulmonary artery, which may represent pulmonary arterial hypertension. 5. Aortic atherosclerosis. Aortic Atherosclerosis (ICD10-I70.0). Electronically Signed   By: Virgina Norfolk M.D.   On: 11/10/2020 20:05    Scheduled Meds: . amLODipine  10 mg Oral Daily  . apixaban  2.5 mg Oral BID  . aspirin  81 mg Oral Daily  . atorvastatin  80 mg Oral q1800  . carvedilol  25 mg Oral BID  . escitalopram  10 mg Oral Daily  . gabapentin  300 mg Oral QHS  . insulin aspart  0-6 Units Subcutaneous TID WC  . irbesartan  150 mg Oral Daily  . pantoprazole  40 mg Oral Daily  . sevelamer carbonate  1,600 mg Oral TID WC   Continuous Infusions: . [START ON 11/12/2020] ceFEPime (MAXIPIME) IV       LOS: 1 day   Time spent: 35 minutes   Darliss Cheney, MD [facility]  11/11/2020, 12:45 PM   To contact the attending provider between 7A-7P or the covering provider during after hours 7P-7A, please log into the web site [email or web address].

## 2020-11-11 NOTE — Progress Notes (Signed)
Mobility Specialist - Progress Note   11/11/20 1700  Mobility  Activity Ambulated in room  Level of Assistance Contact guard assist, steadying assist  Assistive Device Cane  Distance Ambulated (ft) 50 ft  Mobility Response Tolerated well  Mobility performed by Mobility specialist  $Mobility charge 1 Mobility    Pre-mobility: 71 HR, 97% SpO2 During mobility: 78 HR, 79% SpO2 Post-mobility: 72 HR, 94% SpO2   Pt was sleeping in bed upon arrival utilizing 5L via trach collar. Pt agreed to session. Pt denied any pain, nausea, or fatigue. Pt was able to get EOB and stand modI. Pt ambulated 50' in room with SPC. No LOB noted. During activity, pt's O2 desat to 79% while on room air. Trach collar was reapplied and with seated rest break, pt's O2 sat up to 94% after 1 min. Overall, pt tolerated session well. Pt was left in bed with all needs in reach. Nurse notified of performance.   Kathee Delton Mobility Specialist 11/11/20, 5:25 PM

## 2020-11-11 NOTE — Plan of Care (Signed)

## 2020-11-11 NOTE — Progress Notes (Signed)
CT chest shows interval progression of airspace disease relative to imaging obtained during most recent prior hospitalization, now suggestive of bibasilar infiltrates as well as infiltrates in the RUL and LUL, in the context of interval worsening of patient's respiratory symptoms following completion of course of Rocephin/Azithromycin for CAP during this most recent prior hospitalization. Consequently, will broaden antibiotic coverage spectrum with initiation of IV Vanc and Cefepime via pharmacy consults. Prior to starting these abx, I have ordered collection of blood cx's x 2. Additionally, will check MRSA PCR, with plan to discontinue IV Vanc if negative due to the high negative predictive value for MRSA pna associated with such a finding.    Babs Bertin, DO Hospitalist

## 2020-11-11 NOTE — Progress Notes (Signed)
Pharmacy Antibiotic Note  Deborah [name] is a 64 y.o. female admitted on 11/10/2020 with pneumonia.  Pharmacy has been consulted for Cefepime dosing.  Plan: Cefepime 2g on HD days.  Vancomycin d/c'ed due to MRSA PCR was negative. MRSA PCR has a negative predictive value of 99.2% for MRSA PNA.    Height: 5\' 2"  (157.5 cm) Weight: 62.1 kg (137 lb) IBW/kg (Calculated) : 50.1  Temp (24hrs), Avg:98.6 F (37 C), Min:98.3 F (36.8 C), Max:99.1 F (37.3 C)  Recent Labs  Lab 11/05/20 0414 11/08/20 0907 11/10/20 1309 11/11/20 0351  WBC 8.4 8.8 11.6* 9.3  CREATININE 6.77* 8.89* 6.68* 8.02*    Estimated Creatinine Clearance: 6.2 mL/min (A) (by C-G formula based on SCr of 8.02 mg/dL (H)).    No Known Allergies   Microbiology results: 12/9 BCx: pending   12/9 MRSA PCR: negative   Thank you for allowing pharmacy to be a part of this patient's care.  Oswald Hillock 11/11/2020 8:39 AM

## 2020-11-11 NOTE — Progress Notes (Signed)
Pharmacy Antibiotic Note  Deborah [name] is a 64 y.o. female admitted on 11/10/2020 with pneumonia.  Pharmacy has been consulted for Vancomycin and Cefepime dosing.  Plan: Cefepime 2gm now x 1 Vancomycin 1250mg  now x 1 F/U HD schedule for continued dosing  Height: 5\' 2"  (157.5 cm) Weight: 62.1 kg (137 lb) IBW/kg (Calculated) : 50.1  Temp (24hrs), Avg:98.7 F (37.1 C), Min:98.3 F (36.8 C), Max:99.1 F (37.3 C)  Recent Labs  Lab 11/04/20 0357 11/05/20 0414 11/08/20 0907 11/10/20 1309  WBC 8.2 8.4 8.8 11.6*  CREATININE 4.89* 6.77* 8.89* 6.68*    Estimated Creatinine Clearance: 7.5 mL/min (A) (by C-G formula based on SCr of 6.68 mg/dL (H)).    No Known Allergies  Antimicrobials this admission:   >>    >>   Dose adjustments this admission:   Microbiology results:  BCx:   UCx:    Sputum:    MRSA PCR:   Thank you for allowing pharmacy to be a part of this patient's care.  Hart Robinsons A 11/11/2020 3:41 AM

## 2020-11-11 NOTE — Progress Notes (Signed)
[facility], [place] 11/11/20  Subjective:   LOS: 1  Patient known to our practice, from previous admission and dialysis treatments, presented to the ED yesterday, with acute hypoxic respiratory failure.  Patient sitting up in bed, requiring 5 L of O2 via trach collar.  She received partial dialysis treatment yesterday from her outpatient dialysis center.  According to patient  dialysis treatment was stopped early, due to low SPO2 levels.  We will plan for dialysis tomorrow, as per her regular schedule.   Objective:  Vital signs in last 24 hours:  Temp:  [98 F (36.7 C)-98.4 F (36.9 C)] 98 F (36.7 C) (12/09 1144) Pulse Rate:  [68-76] 68 (12/09 1144) Resp:  [10-22] 18 (12/09 1144) BP: (122-172)/(55-128) 148/59 (12/09 1144) SpO2:  [91 %-100 %] 95 % (12/09 1144) FiO2 (%):  [28 %-40 %] 28 % (12/09 0742) Weight:  [62.1 kg] 62.1 kg (12/09 0227)  Weight change:  Filed Weights   11/10/20 1208 11/11/20 0227  Weight: 63.5 kg 62.1 kg    Intake/Output:    Intake/Output Summary (Last 24 hours) at 11/11/2020 1229 Last data filed at 11/11/2020 1142 Gross per 24 hour  Intake 240 ml  Output 0 ml  Net 240 ml    Physical Exam: General:  Resting in bed, in no acute distress  HEENT  normocephalic, atraumatic  Pulm/lungs  Respirations slightly labored, lungs with bilateral rhonchi, on 5 L O2 via trach collar  CVS/Heart  S1-S2, no rubs or gallops  Abdomen:   Soft, nontender, nondistended  Extremities:  No peripheral edema  Neurologic:  Awake, alert, oriented   Skin:  No acute lesions or rashes  Left arm AVG+ bruit,+Thrill  Basic Metabolic Panel:  Recent Labs  Lab 11/05/20 0414 11/08/20 0907 11/10/20 1309 11/11/20 0351  NA 135 135 137 138  K 4.2 5.3* 4.9 4.4  CL 97* 99 99 100  CO2 27 24 24 27   GLUCOSE 131* 134* 189* 199*  BUN 49* 73* 50* 61*  CREATININE 6.77* 8.89* 6.68* 8.02*  CALCIUM 9.0 9.2 9.2 9.1  MG  --   --   --  1.9  PHOS  --  4.4   --  4.6     CBC: Recent Labs  Lab 11/05/20 0414 11/08/20 0907 11/10/20 1309 11/11/20 0351  WBC 8.4 8.8 11.6* 9.3  NEUTROABS 5.5  --   --  6.7  HGB 8.8* 9.5* 9.1* 7.9*  HCT 26.1* 29.1* 26.1* 23.8*  MCV 84.2 85.1 81.1 84.1  PLT 185 214 198 202      Lab Results  Component Value Date   HEPBSAG NON REACTIVE 06/26/2020   HEPBSAB Non Reactive 08/17/2016   HEPBIGM Negative 08/26/2016      Microbiology:  Recent Results (from the past 240 hour(s))  MRSA PCR Screening     Status: None   Collection Time: 11/02/20 12:00 AM   Specimen: Nasopharyngeal  Result Value Ref Range Status   MRSA by PCR NEGATIVE NEGATIVE Final    Comment:        The GeneXpert MRSA Assay (FDA approved for NASAL specimens only), is one component of a comprehensive MRSA colonization surveillance program. It is not intended to diagnose MRSA infection nor to guide or monitor treatment for MRSA infections. Performed at [facility], [address] [place], [place] [address]   Resp Panel by RT-PCR (Flu A&B, Covid) Nasopharyngeal Swab     Status: None   Collection Time: 11/10/20  3:55 PM  Specimen: Nasopharyngeal Swab; Nasopharyngeal(NP) swabs in vial transport medium  Result Value Ref Range Status   SARS Coronavirus 2 by RT PCR NEGATIVE NEGATIVE Final    Comment: (NOTE) SARS-CoV-2 target nucleic acids are NOT DETECTED.  The SARS-CoV-2 RNA is generally detectable in upper respiratory specimens during the acute phase of infection. The lowest concentration of SARS-CoV-2 viral copies this assay can detect is 138 copies/mL. A negative result does not preclude SARS-Cov-2 infection and should not be used as the sole basis for treatment or other patient management decisions. A negative result may occur with  improper specimen collection/handling, submission of specimen other than nasopharyngeal swab, presence of viral mutation(s) within the areas targeted by this assay, and inadequate number of  viral copies(<138 copies/mL). A negative result must be combined with clinical observations, patient history, and epidemiological information. The expected result is Negative.  Fact Sheet for Patients:  EntrepreneurPulse.com.au  Fact Sheet for Healthcare Providers:  IncredibleEmployment.be  This test is no t yet approved or cleared by the [place] FDA and  has been authorized for detection and/or diagnosis of SARS-CoV-2 by FDA under an Emergency Use Authorization (EUA). This EUA will remain  in effect (meaning this test can be used) for the duration of the COVID-19 declaration under Section 564(b)(1) of the Act, 21 U.S.C.section 360bbb-3(b)(1), unless the authorization is terminated  or revoked sooner.       Influenza A by PCR NEGATIVE NEGATIVE Final   Influenza B by PCR NEGATIVE NEGATIVE Final    Comment: (NOTE) The Xpert Xpress SARS-CoV-2/FLU/RSV plus assay is intended as an aid in the diagnosis of influenza from Nasopharyngeal swab specimens and should not be used as a sole basis for treatment. Nasal washings and aspirates are unacceptable for Xpert Xpress SARS-CoV-2/FLU/RSV testing.  Fact Sheet for Patients: EntrepreneurPulse.com.au  Fact Sheet for Healthcare Providers: IncredibleEmployment.be  This test is not yet approved or cleared by the [place] FDA and has been authorized for detection and/or diagnosis of SARS-CoV-2 by FDA under an Emergency Use Authorization (EUA). This EUA will remain in effect (meaning this test can be used) for the duration of the COVID-19 declaration under Section 564(b)(1) of the Act, 21 U.S.C. section 360bbb-3(b)(1), unless the authorization is terminated or revoked.  Performed at [facility], [address] [place], [place] [address]   Culture, blood (Routine X 2) w Reflex to ID Panel     Status: None (Preliminary result)   Collection Time:  11/11/20  3:51 AM   Specimen: BLOOD  Result Value Ref Range Status   Specimen Description BLOOD RIGHT ASSIST CONTROL  Final   Special Requests   Final    BOTTLES DRAWN AEROBIC AND ANAEROBIC Blood Culture results may not be optimal due to an excessive volume of blood received in culture bottles   Culture   Final    NO GROWTH < 12 HOURS Performed at [facility], [address], [place], [place] [address]    Report Status PENDING  Incomplete  MRSA PCR Screening     Status: None   Collection Time: 11/11/20  4:10 AM   Specimen: Nasal Mucosa; Nasopharyngeal  Result Value Ref Range Status   MRSA by PCR NEGATIVE NEGATIVE Final    Comment:        The GeneXpert MRSA Assay (FDA approved for NASAL specimens only), is one component of a comprehensive MRSA colonization surveillance program. It is not intended to diagnose MRSA infection nor to guide or monitor treatment for MRSA infections.  Performed at [facility], [address] [place], [place] [address]   Culture, blood (Routine X 2) w Reflex to ID Panel     Status: None (Preliminary result)   Collection Time: 11/11/20  5:03 AM   Specimen: BLOOD  Result Value Ref Range Status   Specimen Description BLOOD RIGHT HAND  Final   Special Requests   Final    BOTTLES DRAWN AEROBIC AND ANAEROBIC Blood Culture results may not be optimal due to an excessive volume of blood received in culture bottles   Culture   Final    NO GROWTH < 12 HOURS Performed at [facility], [address] [place], [place] [address]    Report Status PENDING  Incomplete    Coagulation Studies: No results for input(s): LABPROT, INR in the last 72 hours.  Urinalysis: No results for input(s): COLORURINE, LABSPEC, PHURINE, GLUCOSEU, HGBUR, BILIRUBINUR, KETONESUR, PROTEINUR, UROBILINOGEN, NITRITE, LEUKOCYTESUR in the last 72 hours.  Invalid input(s): APPERANCEUR    Imaging: DG Chest 2 View  Result Date: 11/10/2020 CLINICAL DATA:   Shortness of breath. EXAM: CHEST - 2 VIEW COMPARISON:  November 03, 2020. FINDINGS: Stable cardiomegaly. Status post cardiac valve repair. Tracheostomy tube is unchanged in position. No pneumothorax is noted. Left basilar opacity is significantly improved compared to prior exam. Minimal right basilar subsegmental atelectasis is noted. Bony thorax is unremarkable. IMPRESSION: Significantly improved left basilar opacity compared to prior exam. Electronically Signed   By: Marijo Conception M.D.   On: 11/10/2020 13:09   CT CHEST WO CONTRAST  Result Date: 11/10/2020 CLINICAL DATA:  Decreased oxygen saturation. EXAM: CT CHEST WITHOUT CONTRAST TECHNIQUE: Multidetector CT imaging of the chest was performed following the standard protocol without IV contrast. COMPARISON:  June 25, 2020 FINDINGS: Cardiovascular: There is mild calcification of the aortic arch. Stable enlargement of the main pulmonary artery is noted (approximately 3.8 cm). Of the there is marked severity cardiomegaly with marked severity coronary artery calcification. 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 No pericardial effusion. Mediastinum/Nodes: Multiple sternal wires are seen. Mild pretracheal lymphadenopathy is noted. There is stable tracheostomy tube positioning. The thyroid gland and esophagus demonstrate no significant findings. Lungs/Pleura: Moderate severity areas of atelectasis and/or infiltrate are seen within the posterior aspect of the bilateral lower lobes and inferior aspect of the left upper lobe. Mild involvement of the posterior aspect of the right upper lobe is also seen. Very small bilateral pleural effusions are seen. No pneumothorax is identified. Upper Abdomen: Punctate calcified granulomas are seen scattered throughout the spleen. Musculoskeletal: Multilevel degenerative changes seen throughout the thoracic spine. IMPRESSION: 1. Moderate severity bilateral lower lobe and left upper lobe atelectasis and/or infiltrate with mild  involvement of the posterior aspect of the right upper lobe. 2. Very small bilateral pleural effusions. 3. Marked severity cardiomegaly with an artificial mitral valve. 4. Stable enlargement of the main pulmonary artery, which may represent pulmonary arterial hypertension. 5. Aortic atherosclerosis. Aortic Atherosclerosis (ICD10-I70.0). Electronically Signed   By: Virgina Norfolk M.D.   On: 11/10/2020 20:05     Medications:   . [START ON 11/12/2020] ceFEPime (MAXIPIME) IV     . amLODipine  10 mg Oral Daily  . apixaban  2.5 mg Oral BID  . aspirin  81 mg Oral Daily  . atorvastatin  80 mg Oral q1800  . carvedilol  25 mg Oral BID  . escitalopram  10 mg Oral Daily  . gabapentin  300 mg Oral QHS  . insulin aspart  0-6  Units Subcutaneous TID WC  . irbesartan  150 mg Oral Daily  . pantoprazole  40 mg Oral Daily  . sevelamer carbonate  1,600 mg Oral TID WC   acetaminophen, ipratropium-albuterol  Assessment/ Plan:  64 y.o. 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 ESRD MWF Schedule MWF, estimated dry weight 61.5 kg, left arm AV graft, [facility]  Patient received partial dialysis treatment from her outpatient dialysis center yesterday Treatment got interrupted due to low SPO2 levels We will plan for dialysis again tomorrow, as per her regular schedule  #. Anemia of CKD  Lab Results  Component Value Date   HGB 7.9 (L) 11/11/2020  Will resume  Epogen with  hemodialysis treatments  #. Secondary hyperparathyroidism of renal origin N 25.81      Component Value Date/Time   PTH 270 (H) 10/03/2017 0949   Lab Results  Component Value Date   PHOS 4.6 11/11/2020   Continue sevelamer   #. Diabetes type 2 with CKD Hgb A1c MFr Bld (%)  Date Value  11/01/2020 7.2 (H)   Patient is on insulin aspart  # Pneumonia CT scan on 11/10/2020 IMPRESSION: 1. Moderate severity bilateral lower lobe and left upper lobe atelectasis and/or infiltrate with mild involvement of the posterior aspect of the right upper lobe. 2. Very small bilateral pleural effusions. 3. Marked severity cardiomegaly with an artificial mitral valve. 4. Stable enlargement of the main pulmonary artery, which may represent pulmonary arterial hypertension. 5. Aortic atherosclerosis  Antibiotic treatment per primary team   LOS: 1 Latrelle Bazar 12/9/202112:29 PM  [facility] [place], [place]

## 2020-11-12 LAB — CBC WITH DIFFERENTIAL/PLATELET
Abs Immature Granulocytes: 0.04 10*3/uL (ref 0.00–0.07)
Basophils Absolute: 0.1 10*3/uL (ref 0.0–0.1)
Basophils Relative: 1 %
Eosinophils Absolute: 0.5 10*3/uL (ref 0.0–0.5)
Eosinophils Relative: 5 %
HCT: 22.1 % — ABNORMAL LOW (ref 36.0–46.0)
Hemoglobin: 7.3 g/dL — ABNORMAL LOW (ref 12.0–15.0)
Immature Granulocytes: 0 %
Lymphocytes Relative: 9 %
Lymphs Abs: 0.9 10*3/uL (ref 0.7–4.0)
MCH: 28 pg (ref 26.0–34.0)
MCHC: 33 g/dL (ref 30.0–36.0)
MCV: 84.7 fL (ref 80.0–100.0)
Monocytes Absolute: 1 10*3/uL (ref 0.1–1.0)
Monocytes Relative: 10 %
Neutro Abs: 7.1 10*3/uL (ref 1.7–7.7)
Neutrophils Relative %: 75 %
Platelets: 202 10*3/uL (ref 150–400)
RBC: 2.61 MIL/uL — ABNORMAL LOW (ref 3.87–5.11)
RDW: 17.2 % — ABNORMAL HIGH (ref 11.5–15.5)
WBC: 9.6 10*3/uL (ref 4.0–10.5)
nRBC: 0 % (ref 0.0–0.2)

## 2020-11-12 LAB — FOLATE: Folate: 34 ng/mL (ref >5.9)

## 2020-11-12 LAB — BASIC METABOLIC PANEL
Anion gap: 14 (ref 5–15)
BUN: 79 mg/dL — ABNORMAL HIGH (ref 8–23)
CO2: 23 mmol/L (ref 22–32)
Calcium: 9 mg/dL (ref 8.9–10.3)
Chloride: 98 mmol/L (ref 98–111)
Creatinine, Ser: 9.58 mg/dL — ABNORMAL HIGH (ref 0.44–1.00)
GFR, Estimated: 4 mL/min — ABNORMAL LOW (ref >60)
Glucose, Bld: 158 mg/dL — ABNORMAL HIGH (ref 70–99)
Potassium: 4.6 mmol/L (ref 3.5–5.1)
Sodium: 135 mmol/L (ref 135–145)

## 2020-11-12 LAB — RETICULOCYTES
Immature Retic Fract: 13.1 % (ref 2.3–15.9)
RBC.: 2.79 MIL/uL — ABNORMAL LOW (ref 3.87–5.11)
Retic Count, Absolute: 25.1 10*3/uL (ref 19.0–186.0)
Retic Ct Pct: 0.9 % (ref 0.4–3.1)

## 2020-11-12 LAB — IRON AND TIBC
Iron: 38 ug/dL (ref 28–170)
Saturation Ratios: 19 % (ref 10.4–31.8)
TIBC: 206 ug/dL — ABNORMAL LOW (ref 250–450)
UIBC: 168 ug/dL

## 2020-11-12 LAB — GLUCOSE, CAPILLARY
Glucose-Capillary: 145 mg/dL — ABNORMAL HIGH (ref 70–99)
Glucose-Capillary: 105 mg/dL — ABNORMAL HIGH (ref 70–99)

## 2020-11-12 LAB — TRANSFERRIN: Transferrin: 159 mg/dL — ABNORMAL LOW (ref 192–382)

## 2020-11-12 LAB — FERRITIN: Ferritin: 845 ng/mL — ABNORMAL HIGH (ref 11–307)

## 2020-11-12 LAB — PHOSPHORUS: Phosphorus: 4.5 mg/dL (ref 2.5–4.6)

## 2020-11-12 LAB — VITAMIN B12: Vitamin B-12: 696 pg/mL (ref 180–914)

## 2020-11-12 MED ORDER — EPOETIN ALFA 10000 UNIT/ML IJ SOLN
10000.0000 [IU] | INTRAMUSCULAR | Status: DC
Start: 2020-11-15 — End: 2020-11-13

## 2020-11-12 MED ORDER — CEFDINIR 300 MG PO CAPS
300.0000 mg | ORAL_CAPSULE | Freq: Once | ORAL | Status: DC
  Filled 2020-11-12: qty 1

## 2020-11-12 MED ORDER — ALTEPLASE 2 MG IJ SOLR: 2.0000 mg | Freq: Once | INTRAMUSCULAR | Status: DC | PRN

## 2020-11-12 MED ORDER — LIDOCAINE-PRILOCAINE 2.5-2.5 % EX CREA
1.0000 "application " | TOPICAL_CREAM | CUTANEOUS | Status: DC | PRN
  Filled 2020-11-12: qty 5

## 2020-11-12 MED ORDER — CEFDINIR 300 MG PO CAPS: 300.0000 mg | ORAL_CAPSULE | ORAL | 0 refills | Status: AC

## 2020-11-12 MED ORDER — SODIUM CHLORIDE 0.9 % IV SOLN: 100.0000 mL | INTRAVENOUS | Status: DC | PRN

## 2020-11-12 MED ORDER — LIDOCAINE HCL (PF) 1 % IJ SOLN
5.0000 mL | INTRAMUSCULAR | Status: DC | PRN
  Filled 2020-11-12: qty 5

## 2020-11-12 MED ORDER — CHLORHEXIDINE GLUCONATE CLOTH 2 % EX PADS: 6.0000 | MEDICATED_PAD | Freq: Every day | CUTANEOUS | Status: DC

## 2020-11-12 MED ORDER — HEPARIN SODIUM (PORCINE) 1000 UNIT/ML DIALYSIS
1000.0000 [IU] | INTRAMUSCULAR | Status: DC | PRN
  Filled 2020-11-12: qty 1

## 2020-11-12 MED ORDER — PENTAFLUOROPROP-TETRAFLUOROETH EX AERO
1.0000 "application " | INHALATION_SPRAY | CUTANEOUS | Status: DC | PRN
  Filled 2020-11-12: qty 30

## 2020-11-12 NOTE — Care Management Important Message (Signed)
Important Message  Patient Details  Name: KELCI PETRELLA MRN: [id number] Date of Birth: [birth year]   Medicare Important Message Given:  N/A - LOS <3 / Initial given by admissions  Initial Medicare IM reviewed with Tomi Likens, son by Meredith Mody, Patient Access Associate on 11/12/2020 at 10:22am.   Dannette Barbara 11/12/2020, 2:51 PM

## 2020-11-12 NOTE — Progress Notes (Signed)
Patient resting comfortably in bed after eating breakfast, I spoke to HD RN and patient going to HD this morning. Appropriate Medications held per HD order. Patient given suction supplies by this Rn and she suctioned trach independently per her request.

## 2020-11-12 NOTE — Discharge Instructions (Signed)
How to Clean a Tracheostomy Tube, Adult  A tracheostomy tube, or trach tube, allows a person to breathe without using his or her nose or mouth. A trach tube may be made up of one tube called an outer cannula. Or, it may be made up of an outer cannula and a second tube called an inner cannula. Keeping a trach tube clean helps prevent infection and helps make breathing easier. Supplies needed:  Towel.  Suction supplies.  Sterile trach care kit. The kit should contain: ? 4 x 4 inch (10 x 10 cm) gauze pads. ? Cotton-tipped swabs. ? Trach bandage (dressing). ? Container. ? 0.9% saline solution. ? Small trach brush or disposable inner cannula. ? Roll of twill tape, trach ties, or a hook and loop trach holder. ? Scissors.  Clean gloves. How to clean a trach tube 1. Have all supplies ready and available. 2. Suction the trach tube as needed. 3. Wash your hands well. 4. Put on a clean pair of gloves. 5. Fill the container with 0.9% saline solution. 6. Give oxygen as needed. 7. If the inner cannula is nondisposable: ? Touch only the outer part of the trach tube as you unlock and remove the inner cannula. ? Drop the inner cannula into 0.9% saline solution. ? Use a small brush to clean the inside and outside of the inner cannula. ? Hold the inner cannula over the container, and rinse the cannula with 0.9% saline solution. ? Pat the inner cannula with gauze to help it get dry. ? Replace the inner cannula. ? Lock the cannula into position. ? Give oxygen as needed. 8. If the inner cannula is disposable: ? Remove the new cannula from the packaging. ? Touch only the outer part of the trach tube as you remove the inner cannula. ? Replace the old cannula with the new cannula. ? Lock the cannula into position. ? Throw away the old cannula. ? Give oxygen as needed. 9. Use gauze or cotton swabs to clean the outer cannula surfaces, extending 2-4 inches (5-10 cm) in all directions under the neck  plate. 10. Use a cotton swab to clean the opening in the neck in a circular motion. Start from the opening and go outward. 11. Dry the skin and the outer cannula by gently patting the area with a dry gauze pad. 12. Secure the trach tube with twill tape, trach ties, or a trach holder. 13. Place a dressing around the trach site. 14. Give oxygen as needed. 15. Throw away any used supplies. 16. Remove your gloves. 17. Wash your hands. This information is not intended to replace advice given to you by your health care provider. Make sure you discuss any questions you have with your health care provider. Document Revised: 11/02/2017 Document Reviewed: 08/14/2016 Elsevier Patient Education  2020 [employer]  

## 2020-11-12 NOTE — Plan of Care (Signed)

## 2020-11-12 NOTE — Discharge Summary (Addendum)
Physician Discharge Summary  Deborah [name] UKG:[id number] DOB: 03-14-1956 DOA: 11/10/2020  PCP: Leta Speller, MD  Admit date: 11/10/2020 Discharge date: 11/12/2020  Admitted From: Home Disposition: Home  Recommendations for Outpatient Follow-up:  1. Follow up with PCP in 1-2 weeks 2. Please obtain BMP/CBC in one week 3. Please follow up with your PCP on the following pending results: Unresulted Labs (From admission, onward)          Start     Ordered   11/12/20 0933  Transferrin  Once,   R       Question:  Specimen collection method  Answer:  Lab=Lab collect   11/12/20 0932   11/12/20 0933  Vitamin B12  Once,   R       Question:  Specimen collection method  Answer:  Lab=Lab collect   11/12/20 0932   11/12/20 0932  Occult blood card to lab, stool  Once,   R        11/12/20 0932   11/11/20 1251  Strep pneumoniae urinary antigen  Once,   R        11/11/20 1250   11/11/20 1251  Legionella Pneumophila Serogp 1 Ur Ag  Once,   R        11/11/20 1250   11/11/20 1251  Expectorated sputum assessment w rflx to resp cult  Once,   R        11/11/20 1250   Signed and Held  Phosphorus  Once,   R       Question:  Specimen collection method  Answer:  Lab=Lab collect   Signed and Held   Signed and Held  Parathyroid hormone, intact (no Ca)  Once,   R       Question:  Specimen collection method  Answer:  Lab=Lab collect   Signed and Held           Home Health: Yes, home health RN Equipment/Devices: None  Discharge Condition: Stable CODE STATUS: Full code Diet recommendation: Cardiac/renal  Subjective: Seen and examined.  Feels much better.  No shortness of breath.  Feels like she is back to her baseline.  Ready to go home.  Brief/Interim Summary: Deborah [name] a 64 y.o.femalewith medical history significant forparoxysmal atrial fibrillation chronically anticoagulated on Eliquis, chronic diastolic heart failure, glottic stenosis status post chronic tracheostomy, end-stage  renal disease on hemodialysis on Monday, Wednesday, Friday schedule, hypertension, type 2 diabetes mellitus,[facility]acquired [facility] and was treated with Rocephin and azithromycin, she completed her course of antibiotics and was discharged home.  However, since discharge, over last 2 days, she reports progressive shortness of breath associated with return of productive cough and increased secretions via her tracheostomy. Denied any associated subjective fever, chills, rigors, or generalized myalgias.No known COVID-19 exposures.   Earlier on the day of admission, patient was attending her regularly scheduled hemodialysis session,at which time her oxygen saturations were noted to be in the high 70s on room air,prompting her to be sent to the emergency room for further evaluation.Of note, she reportedly was able to complete the totality of her dialysis prior to leaving the dialysis center.  Vital signs in the ED were notable for the following:Temperature max 99.1; heart rate 70-79; blood pressure138/60 - 159/71;respiratory rate 15-20; initial oxygen saturation noted to be 82% on room air, which is improved to 97% via 4 L blow-by O2 via trach collar. Nasopharyngeal COVID-19/influenza PCR found to be negative.  Chest x-ray,  in comparison to most recent prior chest x-ray performed on 11/03/2020, showed significant improvement in previously noted left lower lobe airspace opacity, while showing minimal right basilar atelectasis,and no evidence of effusion or pneumothorax. EKG showed normal sinus rhythm, rate 74, nonspecific T wave version V1, and no evidence of ST changes, including no evidence of ST elevation. [facility] acute on chronic hypoxic respiratory failure and she underwent CT of the chest which showed left upper and lower lobe as  well as right lower lobe infiltrate.  She was presumed to be having new pneumonia so she was started on cefepime.  Over last 2 days of hospitalization, patient has improved significantly.  Currently she is requiring 3 to 4 L of oxygen which is her baseline.  She feels much better symptomatically as well.  She remained afebrile.  Leukocytosis resolved.  She feels like she is back to her baseline and feels ready for discharge.  She is being discharged home in stable condition today and I will prescribe her few more days of oral cefdinir to complete the course.   Discharge Diagnoses:  Principal Problem:   Acute respiratory failure with hypoxia ([place]) Active Problems:   Hypertension   Diabetes mellitus type 2, controlled ([place])   Hyperlipidemia   PAF (paroxysmal atrial fibrillation) ([place])   Acute bronchitis    Discharge Instructions   Allergies as of 11/12/2020   No Known Allergies     Medication List    TAKE these medications   acetaminophen 325 MG tablet Commonly known as: TYLENOL Take 650 mg by mouth every 6 (six) hours as needed for mild pain or fever.   albuterol (2.5 MG/3ML) 0.083% nebulizer solution Commonly known as: PROVENTIL Take 3 mLs (2.5 mg total) by nebulization 4 (four) times daily - after meals and at bedtime. What changed:   when to take this  reasons to take this   amLODipine 10 MG tablet Commonly known as: NORVASC Take 10 mg by mouth daily.   apixaban 2.5 MG Tabs tablet Commonly known as: ELIQUIS Take 2.5 mg by mouth 2 (two) times daily.   aspirin 81 MG EC tablet Take 1 tablet (81 mg total) by mouth daily.   atorvastatin 80 MG tablet Commonly known as: LIPITOR Take 80 mg by mouth daily at 6 PM.   carvedilol 25 MG tablet Commonly known as: COREG Take 25 mg by mouth 2 (two) times daily.   cefdinir 300 MG capsule Commonly known as: OMNICEF Take 1 capsule (300 mg total) by mouth every other day for 3 doses. Please take on dialysis days, after  dialysis.   escitalopram 10 MG tablet Commonly known as: LEXAPRO Take 10 mg by mouth daily.   furosemide 40 MG tablet Commonly known as: LASIX Take 40 mg by mouth daily. ON NON dialysis days, Sunday, Tuesday, Thursday, Saturday   gabapentin 300 MG capsule Commonly known as: NEURONTIN Take 1 capsule (300 mg total) by mouth at bedtime.   Levemir FlexTouch 100 UNIT/ML FlexPen Generic drug: insulin detemir Inject 13 Units into the skin at bedtime.   lidocaine-prilocaine cream Commonly known as: EMLA Apply 1 application topically every dialysis.   midodrine 5 MG tablet Commonly known as: PROAMATINE Take 5 mg by mouth 3 (three) times a week.   multivitamin Tabs tablet Take 1 tablet by mouth daily.   olmesartan 20 MG tablet Commonly known as: BENICAR Take 20 mg by mouth daily.   pantoprazole 40 MG  tablet Commonly known as: PROTONIX Take 1 tablet (40 mg total) by mouth 2 (two) times daily.   sevelamer carbonate 800 MG tablet Commonly known as: RENVELA Take 1,600 mg by mouth 3 (three) times daily with meals.   traZODone 50 MG tablet Commonly known as: DESYREL Take 50 mg by mouth 3 (three) times a week. Take on Dialysis days       Follow-up Information    Leta Speller, MD Follow up in 1 week(s).   Specialty: Internal Medicine Contact information: [address] [place] [address] [phone]              No Known Allergies  Consultations: Nephrology for routine hemodialysis   Procedures/Studies: DG Chest 2 View  Result Date: 11/10/2020 CLINICAL DATA:  Shortness of breath. EXAM: CHEST - 2 VIEW COMPARISON:  November 03, 2020. FINDINGS: Stable cardiomegaly. Status post cardiac valve repair. Tracheostomy tube is unchanged in position. No pneumothorax is noted. Left basilar opacity is significantly improved compared to prior exam. Minimal right basilar subsegmental atelectasis is noted. Bony thorax is unremarkable. IMPRESSION: Significantly improved  left basilar opacity compared to prior exam. Electronically Signed   By: Marijo Conception M.D.   On: 11/10/2020 13:09   DG Chest 2 View  Result Date: 11/03/2020 CLINICAL DATA:  Pneumonia. EXAM: CHEST - 2 VIEW COMPARISON:  November 01, 2020. FINDINGS: Stable cardiomegaly. Status post cardiac valve repair. Tracheostomy tube is in good position. No pneumothorax is noted. Stable right basilar subsegmental atelectasis is noted. Increased left basilar opacity is noted concerning for worsening atelectasis or pneumonia with small left pleural effusion. Bony thorax is unremarkable. IMPRESSION: Increased left basilar opacity is noted concerning for worsening atelectasis or pneumonia with small left pleural effusion. Electronically Signed   By: Marijo Conception M.D.   On: 11/03/2020 08:28   DG Chest 2 View  Result Date: 11/01/2020 CLINICAL DATA:  Shortness of breath. EXAM: CHEST - 2 VIEW COMPARISON:  11/01/2028 FINDINGS: Sternotomy wires, prosthetic heart valve and tracheostomy tube unchanged. Lungs are adequately inflated and demonstrate hazy opacification over the right base which may be due to combination layering pleural fluid and atelectasis although infection is possible. There is mild hazy prominence of the central pulmonary vessels likely mild degree of vascular congestion. Stable cardiomegaly. Remainder of the exam is unchanged. IMPRESSION: 1. Hazy opacification over the right base which may be due to combination of layering pleural fluid and atelectasis, although infection is possible. 2. Cardiomegaly with suggestion of mild vascular congestion. Electronically Signed   By: Marin Olp M.D.   On: 11/01/2020 09:44   CT CHEST WO CONTRAST  Result Date: 11/10/2020 CLINICAL DATA:  Decreased oxygen saturation. EXAM: CT CHEST WITHOUT CONTRAST TECHNIQUE: Multidetector CT imaging of the chest was performed following the standard protocol without IV contrast. COMPARISON:  June 25, 2020 FINDINGS: Cardiovascular:  There is mild calcification of the aortic arch. Stable enlargement of the main pulmonary artery is noted (approximately 3.8 cm). Of the there is marked severity cardiomegaly with marked severity coronary artery calcification. 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 No pericardial effusion. Mediastinum/Nodes: Multiple sternal wires are seen. Mild pretracheal lymphadenopathy is noted. There is stable tracheostomy tube positioning. The thyroid gland and esophagus demonstrate no significant findings. Lungs/Pleura: Moderate severity areas of atelectasis and/or infiltrate are seen within the posterior aspect of the bilateral lower lobes and inferior aspect of the left upper lobe. Mild involvement of the posterior aspect of the right upper lobe is also seen. Very  small bilateral pleural effusions are seen. No pneumothorax is identified. Upper Abdomen: Punctate calcified granulomas are seen scattered throughout the spleen. Musculoskeletal: Multilevel degenerative changes seen throughout the thoracic spine. IMPRESSION: 1. Moderate severity bilateral lower lobe and left upper lobe atelectasis and/or infiltrate with mild involvement of the posterior aspect of the right upper lobe. 2. Very small bilateral pleural effusions. 3. Marked severity cardiomegaly with an artificial mitral valve. 4. Stable enlargement of the main pulmonary artery, which may represent pulmonary arterial hypertension. 5. Aortic atherosclerosis. Aortic Atherosclerosis (ICD10-I70.0). Electronically Signed   By: Virgina Norfolk M.D.   On: 11/10/2020 20:05      Discharge Exam: Vitals:   11/12/20 1145 11/12/20 1200  BP: (!) 155/74 (!) 113/94  Pulse: 70 71  Resp:    Temp:    SpO2: 99% 99%   Vitals:   11/12/20 1115 11/12/20 1130 11/12/20 1145 11/12/20 1200  BP: 130/69 137/70 (!) 155/74 (!) 113/94  Pulse: 70 70 70 71  Resp: 16 19    Temp:      TempSrc:      SpO2: 99% 99% 99% 99%  Weight:      Height:        General: Pt is alert, awake, not  in acute distress Cardiovascular: RRR, S1/S2 +, no rubs, no gallops Respiratory: Scattered rhonchi bilaterally, no wheezing, no rhonchi, tracheostomy in place Abdominal: Soft, NT, ND, bowel sounds + Extremities: no edema, no cyanosis    The results of significant diagnostics from this hospitalization (including imaging, microbiology, ancillary and laboratory) are listed below for reference.     Microbiology: Recent Results (from the past 240 hour(s))  Resp Panel by RT-PCR (Flu A&B, Covid) Nasopharyngeal Swab     Status: None   Collection Time: 11/10/20  3:55 PM   Specimen: Nasopharyngeal Swab; Nasopharyngeal(NP) swabs in vial transport medium  Result Value Ref Range Status   SARS Coronavirus 2 by RT PCR NEGATIVE NEGATIVE Final    Comment: (NOTE) SARS-CoV-2 target nucleic acids are NOT DETECTED.  The SARS-CoV-2 RNA is generally detectable in upper respiratory specimens during the acute phase of infection. The lowest concentration of SARS-CoV-2 viral copies this assay can detect is 138 copies/mL. A negative result does not preclude SARS-Cov-2 infection and should not be used as the sole basis for treatment or other patient management decisions. A negative result may occur with  improper specimen collection/handling, submission of specimen other than nasopharyngeal swab, presence of viral mutation(s) within the areas targeted by this assay, and inadequate number of viral copies(<138 copies/mL). A negative result must be combined with clinical observations, patient history, and epidemiological information. The expected result is Negative.  Fact Sheet for Patients:  EntrepreneurPulse.com.au  Fact Sheet for Healthcare Providers:  IncredibleEmployment.be  This test is no t yet approved or cleared by the [place] FDA and  has been authorized for detection and/or diagnosis of SARS-CoV-2 by FDA under an Emergency Use Authorization (EUA). This  EUA will remain  in effect (meaning this test can be used) for the duration of the COVID-19 declaration under Section 564(b)(1) of the Act, 21 U.S.C.section 360bbb-3(b)(1), unless the authorization is terminated  or revoked sooner.       Influenza A by PCR NEGATIVE NEGATIVE Final   Influenza B by PCR NEGATIVE NEGATIVE Final    Comment: (NOTE) The Xpert Xpress SARS-CoV-2/FLU/RSV plus assay is intended as an aid in the diagnosis of influenza from Nasopharyngeal swab specimens and should not be used as a sole basis for  treatment. Nasal washings and aspirates are unacceptable for Xpert Xpress SARS-CoV-2/FLU/RSV testing.  Fact Sheet for Patients: EntrepreneurPulse.com.au  Fact Sheet for Healthcare Providers: IncredibleEmployment.be  This test is not yet approved or cleared by the [place] FDA and has been authorized for detection and/or diagnosis of SARS-CoV-2 by FDA under an Emergency Use Authorization (EUA). This EUA will remain in effect (meaning this test can be used) for the duration of the COVID-19 declaration under Section 564(b)(1) of the Act, 21 U.S.C. section 360bbb-3(b)(1), unless the authorization is terminated or revoked.  Performed at [facility], [address] [place], [place] [address]   Culture, blood (Routine X 2) w Reflex to ID Panel     Status: None (Preliminary result)   Collection Time: 11/11/20  3:51 AM   Specimen: BLOOD  Result Value Ref Range Status   Specimen Description BLOOD RIGHT ASSIST CONTROL  Final   Special Requests   Final    BOTTLES DRAWN AEROBIC AND ANAEROBIC Blood Culture results may not be optimal due to an excessive volume of blood received in culture bottles   Culture   Final    NO GROWTH 1 DAY Performed at [facility], [address] [place], [place] [address]    Report Status PENDING  Incomplete  MRSA PCR Screening     Status: None   Collection Time: 11/11/20  4:10 AM    Specimen: Nasal Mucosa; Nasopharyngeal  Result Value Ref Range Status   MRSA by PCR NEGATIVE NEGATIVE Final    Comment:        The GeneXpert MRSA Assay (FDA approved for NASAL specimens only), is one component of a comprehensive MRSA colonization surveillance program. It is not intended to diagnose MRSA infection nor to guide or monitor treatment for MRSA infections. Performed at [facility], [address] [place], [place] [address]   Culture, blood (Routine X 2) w Reflex to ID Panel     Status: None (Preliminary result)   Collection Time: 11/11/20  5:03 AM   Specimen: BLOOD  Result Value Ref Range Status   Specimen Description BLOOD RIGHT HAND  Final   Special Requests   Final    BOTTLES DRAWN AEROBIC AND ANAEROBIC Blood Culture results may not be optimal due to an excessive volume of blood received in culture bottles   Culture   Final    NO GROWTH 1 DAY Performed at [facility], [address] [place], [place] [address]    Report Status PENDING  Incomplete  Respiratory Panel by PCR     Status: None   Collection Time: 11/11/20  2:54 PM   Specimen: Nasopharyngeal Swab; Respiratory  Result Value Ref Range Status   Adenovirus NOT DETECTED NOT DETECTED Final   Coronavirus 229E NOT DETECTED NOT DETECTED Final    Comment: (NOTE) The Coronavirus on the Respiratory Panel, DOES NOT test for the novel  Coronavirus (2019 nCoV)    Coronavirus HKU1 NOT DETECTED NOT DETECTED Final   Coronavirus NL63 NOT DETECTED NOT DETECTED Final   Coronavirus OC43 NOT DETECTED NOT DETECTED Final   Metapneumovirus NOT DETECTED NOT DETECTED Final   Rhinovirus / Enterovirus NOT DETECTED NOT DETECTED Final   Influenza A NOT DETECTED NOT DETECTED Final   Influenza B NOT DETECTED NOT DETECTED Final   Parainfluenza Virus 1 NOT DETECTED NOT DETECTED Final   Parainfluenza Virus 2 NOT DETECTED NOT DETECTED Final   Parainfluenza Virus 3 NOT DETECTED NOT DETECTED Final    Parainfluenza Virus  4 NOT DETECTED NOT DETECTED Final   Respiratory Syncytial Virus NOT DETECTED NOT DETECTED Final   Bordetella pertussis NOT DETECTED NOT DETECTED Final   Bordetella Parapertussis NOT DETECTED NOT DETECTED Final   Chlamydophila pneumoniae NOT DETECTED NOT DETECTED Final   Mycoplasma pneumoniae NOT DETECTED NOT DETECTED Final    Comment: Performed at [facility], [place] [address] [place], [place] [address]     Labs: BNP (last 3 results) Recent Labs    04/03/20 0404 11/01/20 0912 11/10/20 1542  BNP 598.0* 942.5* 542.7*   Basic Metabolic Panel: Recent Labs  Lab 11/08/20 0907 11/10/20 1309 11/11/20 0351 11/12/20 0459  NA 135 137 138 135  K 5.3* 4.9 4.4 4.6  CL 99 99 100 98  CO2 24 24 27 23   GLUCOSE 134* 189* 199* 158*  BUN 73* 50* 61* 79*  CREATININE 8.89* 6.68* 8.02* 9.58*  CALCIUM 9.2 9.2 9.1 9.0  MG  --   --  1.9  --   PHOS 4.4  --  4.6  --    Liver Function Tests: Recent Labs  Lab 11/11/20 0351  AST 16  ALT 21  ALKPHOS 68  BILITOT 1.4*  PROT 6.9  ALBUMIN 3.2*   No results for input(s): LIPASE, AMYLASE in the last 168 hours. No results for input(s): AMMONIA in the last 168 hours. CBC: Recent Labs  Lab 11/08/20 0907 11/10/20 1309 11/11/20 0351 11/12/20 0459  WBC 8.8 11.6* 9.3 9.6  NEUTROABS  --   --  6.7 7.1  HGB 9.5* 9.1* 7.9* 7.3*  HCT 29.1* 26.1* 23.8* 22.1*  MCV 85.1 81.1 84.1 84.7  PLT 214 198 202 202   Cardiac Enzymes: No results for input(s): CKTOTAL, CKMB, CKMBINDEX, TROPONINI in the last 168 hours. BNP: Invalid input(s): POCBNP CBG: Recent Labs  Lab 11/11/20 0857 11/11/20 1141 11/11/20 1645 11/11/20 2107 11/12/20 0758  GLUCAP 149* 168* 181* 243* 145*   D-Dimer No results for input(s): DDIMER in the last 72 hours. Hgb A1c No results for input(s): HGBA1C in the last 72 hours. Lipid Profile No results for input(s): CHOL, HDL, LDLCALC, TRIG, CHOLHDL, LDLDIRECT in the last 72 hours. Thyroid function  studies No results for input(s): TSH, T4TOTAL, T3FREE, THYROIDAB in the last 72 hours.  Invalid input(s): FREET3 Anemia work up Recent Labs    11/12/20 1009  FOLATE 34.0  FERRITIN 845*  TIBC 206*  IRON 38  RETICCTPCT 0.9   Urinalysis    Component Value Date/Time   COLORURINE YELLOW (A) 09/09/2017 1056   APPEARANCEUR HAZY (A) 09/09/2017 1056   LABSPEC 1.013 09/09/2017 1056   PHURINE 7.0 09/09/2017 1056   GLUCOSEU NEGATIVE 09/09/2017 1056   HGBUR NEGATIVE 09/09/2017 1056   BILIRUBINUR NEGATIVE 09/09/2017 1056   [place] 09/09/2017 1056   PROTEINUR 100 (A) 09/09/2017 1056   NITRITE NEGATIVE 09/09/2017 1056   LEUKOCYTESUR MODERATE (A) 09/09/2017 1056   Sepsis Labs Invalid input(s): PROCALCITONIN,  WBC,  LACTICIDVEN Microbiology Recent Results (from the past 240 hour(s))  Resp Panel by RT-PCR (Flu A&B, Covid) Nasopharyngeal Swab     Status: None   Collection Time: 11/10/20  3:55 PM   Specimen: Nasopharyngeal Swab; Nasopharyngeal(NP) swabs in vial transport medium  Result Value Ref Range Status   SARS Coronavirus 2 by RT PCR NEGATIVE NEGATIVE Final    Comment: (NOTE) SARS-CoV-2 target nucleic acids are NOT DETECTED.  The SARS-CoV-2 RNA is generally detectable in upper respiratory specimens during the acute phase of infection. The lowest concentration of  SARS-CoV-2 viral copies this assay can detect is 138 copies/mL. A negative result does not preclude SARS-Cov-2 infection and should not be used as the sole basis for treatment or other patient management decisions. A negative result may occur with  improper specimen collection/handling, submission of specimen other than nasopharyngeal swab, presence of viral mutation(s) within the areas targeted by this assay, and inadequate number of viral copies(<138 copies/mL). A negative result must be combined with clinical observations, patient history, and epidemiological information. The expected result is  Negative.  Fact Sheet for Patients:  EntrepreneurPulse.com.au  Fact Sheet for Healthcare Providers:  IncredibleEmployment.be  This test is no t yet approved or cleared by the [place] FDA and  has been authorized for detection and/or diagnosis of SARS-CoV-2 by FDA under an Emergency Use Authorization (EUA). This EUA will remain  in effect (meaning this test can be used) for the duration of the COVID-19 declaration under Section 564(b)(1) of the Act, 21 U.S.C.section 360bbb-3(b)(1), unless the authorization is terminated  or revoked sooner.       Influenza A by PCR NEGATIVE NEGATIVE Final   Influenza B by PCR NEGATIVE NEGATIVE Final    Comment: (NOTE) The Xpert Xpress SARS-CoV-2/FLU/RSV plus assay is intended as an aid in the diagnosis of influenza from Nasopharyngeal swab specimens and should not be used as a sole basis for treatment. Nasal washings and aspirates are unacceptable for Xpert Xpress SARS-CoV-2/FLU/RSV testing.  Fact Sheet for Patients: EntrepreneurPulse.com.au  Fact Sheet for Healthcare Providers: IncredibleEmployment.be  This test is not yet approved or cleared by the [place] FDA and has been authorized for detection and/or diagnosis of SARS-CoV-2 by FDA under an Emergency Use Authorization (EUA). This EUA will remain in effect (meaning this test can be used) for the duration of the COVID-19 declaration under Section 564(b)(1) of the Act, 21 U.S.C. section 360bbb-3(b)(1), unless the authorization is terminated or revoked.  Performed at [facility], [address] [place], [place] [address]   Culture, blood (Routine X 2) w Reflex to ID Panel     Status: None (Preliminary result)   Collection Time: 11/11/20  3:51 AM   Specimen: BLOOD  Result Value Ref Range Status   Specimen Description BLOOD RIGHT ASSIST CONTROL  Final   Special Requests   Final    BOTTLES DRAWN  AEROBIC AND ANAEROBIC Blood Culture results may not be optimal due to an excessive volume of blood received in culture bottles   Culture   Final    NO GROWTH 1 DAY Performed at [facility], [address] [place],  [address]    Report Status PENDING  Incomplete  MRSA PCR Screening     Status: None   Collection Time: 11/11/20  4:10 AM   Specimen: Nasal Mucosa; Nasopharyngeal  Result Value Ref Range Status   MRSA by PCR NEGATIVE NEGATIVE Final    Comment:        The GeneXpert MRSA Assay (FDA approved for NASAL specimens only), is one component of a comprehensive MRSA colonization surveillance program. It is not intended to diagnose MRSA infection nor to guide or monitor treatment for MRSA infections. Performed at [facility], [address] [place],  [address]   Culture, blood (Routine X 2) w Reflex to ID Panel     Status: None (Preliminary result)   Collection Time: 11/11/20  5:03 AM   Specimen: BLOOD  Result Value Ref Range Status   Specimen Description BLOOD RIGHT HAND  Final  Special Requests   Final    BOTTLES DRAWN AEROBIC AND ANAEROBIC Blood Culture results may not be optimal due to an excessive volume of blood received in culture bottles   Culture   Final    NO GROWTH 1 DAY Performed at [facility], [address] [place], [place] [address]    Report Status PENDING  Incomplete  Respiratory Panel by PCR     Status: None   Collection Time: 11/11/20  2:54 PM   Specimen: Nasopharyngeal Swab; Respiratory  Result Value Ref Range Status   Adenovirus NOT DETECTED NOT DETECTED Final   Coronavirus 229E NOT DETECTED NOT DETECTED Final    Comment: (NOTE) The Coronavirus on the Respiratory Panel, DOES NOT test for the novel  Coronavirus (2019 nCoV)    Coronavirus HKU1 NOT DETECTED NOT DETECTED Final   Coronavirus NL63 NOT DETECTED NOT DETECTED Final   Coronavirus OC43 NOT DETECTED NOT DETECTED Final   Metapneumovirus NOT  DETECTED NOT DETECTED Final   Rhinovirus / Enterovirus NOT DETECTED NOT DETECTED Final   Influenza A NOT DETECTED NOT DETECTED Final   Influenza B NOT DETECTED NOT DETECTED Final   Parainfluenza Virus 1 NOT DETECTED NOT DETECTED Final   Parainfluenza Virus 2 NOT DETECTED NOT DETECTED Final   Parainfluenza Virus 3 NOT DETECTED NOT DETECTED Final   Parainfluenza Virus 4 NOT DETECTED NOT DETECTED Final   Respiratory Syncytial Virus NOT DETECTED NOT DETECTED Final   Bordetella pertussis NOT DETECTED NOT DETECTED Final   Bordetella Parapertussis NOT DETECTED NOT DETECTED Final   Chlamydophila pneumoniae NOT DETECTED NOT DETECTED Final   Mycoplasma pneumoniae NOT DETECTED NOT DETECTED Final    Comment: Performed at [facility], [place]. [address], [place],  [address]     Time coordinating discharge: Over 30 minutes  SIGNED:   Darliss Cheney, MD  [facility] 11/12/2020, 12:17 PM  If 7PM-7AM, please contact night-coverage [email or web address]

## 2020-11-12 NOTE — Progress Notes (Signed)
HD RN called with report on patient before sending her back to unit.  Vitals: BP 136/68 HR 72 T 98.4 HD lasted 3 hours, 2 L removed, patient tolerated well. I assessed patient twice during HD to assist with trach suctioning and replacement of inner cannula.

## 2020-11-12 NOTE — Progress Notes (Addendum)
[facility], [place] 11/12/20  Subjective:   LOS: 2  Patient known to our practice, from previous admission and dialysis treatments, presented to the ED on 11/10/2020, with acute hypoxic respiratory failure.  Update  Patient seen during dialysis, tolerating treatment well.   HEMODIALYSIS FLOWSHEET:  Blood Flow Rate (mL/min): 400 mL/min Arterial Pressure (mmHg): -140 mmHg Venous Pressure (mmHg): 160 mmHg Transmembrane Pressure (mmHg): 80 mmHg Ultrafiltration Rate (mL/min): 630 mL/min Dialysate Flow Rate (mL/min): 800 ml/min Conductivity: Machine : 14 Conductivity: Machine : 14 Dialysis Fluid Bolus: Normal Saline Bolus Amount (mL): 200 mL   Objective:  Vital signs in last 24 hours:  Temp:  [97.7 F (36.5 C)-98.6 F (37 C)] 98.3 F (36.8 C) (12/10 1045) Pulse Rate:  [66-86] 85 (12/10 1345) Resp:  [12-20] 12 (12/10 1300) BP: (113-160)/(53-94) 126/83 (12/10 1400) SpO2:  [91 %-99 %] 99 % (12/10 1400) FiO2 (%):  [28 %] 28 % (12/09 1653) Weight:  [62.6 kg] 62.6 kg (12/10 0354)  Weight change: -0.904 kg Filed Weights   11/10/20 1208 11/11/20 0227 11/12/20 0354  Weight: 63.5 kg 62.1 kg 62.6 kg    Intake/Output:    Intake/Output Summary (Last 24 hours) at 11/12/2020 1451 Last data filed at 11/11/2020 2210 Gross per 24 hour  Intake 350 ml  Output 1 ml  Net 349 ml    Physical Exam: General:  Lying in bed, receiving dialysis treatment  HEENT  Oral mucous membranes moist  Pulm/lungs  On 5 L of O2 via trach collar, respirations symmetrical, unlabored, lungs with fine crackles at the bases  CVS/Heart  regular rate and rhythm  Abdomen:   Soft, nontender, nondistended  Extremities:  No peripheral edema  Neurologic:  speech clear and appropriate   Skin:  No acute lesions or rashes  Left arm AVG+ bruit,+Thrill  Basic Metabolic Panel:  Recent Labs  Lab 11/08/20 0907 11/10/20 1309 11/11/20 0351 11/12/20 0459  NA 135 137 138 135  K 5.3*  4.9 4.4 4.6  CL 99 99 100 98  CO2 24 24 27 23   GLUCOSE 134* 189* 199* 158*  BUN 73* 50* 61* 79*  CREATININE 8.89* 6.68* 8.02* 9.58*  CALCIUM 9.2 9.2 9.1 9.0  MG  --   --  1.9  --   PHOS 4.4  --  4.6  --      CBC: Recent Labs  Lab 11/08/20 0907 11/10/20 1309 11/11/20 0351 11/12/20 0459  WBC 8.8 11.6* 9.3 9.6  NEUTROABS  --   --  6.7 7.1  HGB 9.5* 9.1* 7.9* 7.3*  HCT 29.1* 26.1* 23.8* 22.1*  MCV 85.1 81.1 84.1 84.7  PLT 214 198 202 202      Lab Results  Component Value Date   HEPBSAG NON REACTIVE 06/26/2020   HEPBSAB Non Reactive 08/17/2016   HEPBIGM Negative 08/26/2016      Microbiology:  Recent Results (from the past 240 hour(s))  Resp Panel by RT-PCR (Flu A&B, Covid) Nasopharyngeal Swab     Status: None   Collection Time: 11/10/20  3:55 PM   Specimen: Nasopharyngeal Swab; Nasopharyngeal(NP) swabs in vial transport medium  Result Value Ref Range Status   SARS Coronavirus 2 by RT PCR NEGATIVE NEGATIVE Final    Comment: (NOTE) SARS-CoV-2 target nucleic acids are NOT DETECTED.  The SARS-CoV-2 RNA is generally detectable in upper respiratory specimens during the acute phase of infection. The lowest concentration of SARS-CoV-2 viral copies this assay can detect is 138 copies/mL. A negative result does not  preclude SARS-Cov-2 infection and should not be used as the sole basis for treatment or other patient management decisions. A negative result may occur with  improper specimen collection/handling, submission of specimen other than nasopharyngeal swab, presence of viral mutation(s) within the areas targeted by this assay, and inadequate number of viral copies(<138 copies/mL). A negative result must be combined with clinical observations, patient history, and epidemiological information. The expected result is Negative.  Fact Sheet for Patients:  EntrepreneurPulse.com.au  Fact Sheet for Healthcare Providers:   IncredibleEmployment.be  This test is no t yet approved or cleared by the [place] FDA and  has been authorized for detection and/or diagnosis of SARS-CoV-2 by FDA under an Emergency Use Authorization (EUA). This EUA will remain  in effect (meaning this test can be used) for the duration of the COVID-19 declaration under Section 564(b)(1) of the Act, 21 U.S.C.section 360bbb-3(b)(1), unless the authorization is terminated  or revoked sooner.       Influenza A by PCR NEGATIVE NEGATIVE Final   Influenza B by PCR NEGATIVE NEGATIVE Final    Comment: (NOTE) The Xpert Xpress SARS-CoV-2/FLU/RSV plus assay is intended as an aid in the diagnosis of influenza from Nasopharyngeal swab specimens and should not be used as a sole basis for treatment. Nasal washings and aspirates are unacceptable for Xpert Xpress SARS-CoV-2/FLU/RSV testing.  Fact Sheet for Patients: EntrepreneurPulse.com.au  Fact Sheet for Healthcare Providers: IncredibleEmployment.be  This test is not yet approved or cleared by the [place] FDA and has been authorized for detection and/or diagnosis of SARS-CoV-2 by FDA under an Emergency Use Authorization (EUA). This EUA will remain in effect (meaning this test can be used) for the duration of the COVID-19 declaration under Section 564(b)(1) of the Act, 21 U.S.C. section 360bbb-3(b)(1), unless the authorization is terminated or revoked.  Performed at [facility], [address] [place], [place] [address]   Culture, blood (Routine X 2) w Reflex to ID Panel     Status: None (Preliminary result)   Collection Time: 11/11/20  3:51 AM   Specimen: BLOOD  Result Value Ref Range Status   Specimen Description BLOOD RIGHT ASSIST CONTROL  Final   Special Requests   Final    BOTTLES DRAWN AEROBIC AND ANAEROBIC Blood Culture results may not be optimal due to an excessive volume of blood received in culture  bottles   Culture   Final    NO GROWTH 1 DAY Performed at [facility], [address] [place], [place] [address]    Report Status PENDING  Incomplete  MRSA PCR Screening     Status: None   Collection Time: 11/11/20  4:10 AM   Specimen: Nasal Mucosa; Nasopharyngeal  Result Value Ref Range Status   MRSA by PCR NEGATIVE NEGATIVE Final    Comment:        The GeneXpert MRSA Assay (FDA approved for NASAL specimens only), is one component of a comprehensive MRSA colonization surveillance program. It is not intended to diagnose MRSA infection nor to guide or monitor treatment for MRSA infections. Performed at [facility], [address] [place], [place] [address]   Culture, blood (Routine X 2) w Reflex to ID Panel     Status: None (Preliminary result)   Collection Time: 11/11/20  5:03 AM   Specimen: BLOOD  Result Value Ref Range Status   Specimen Description BLOOD RIGHT HAND  Final   Special Requests   Final    BOTTLES DRAWN AEROBIC AND ANAEROBIC Blood  Culture results may not be optimal due to an excessive volume of blood received in culture bottles   Culture   Final    NO GROWTH 1 DAY Performed at [facility], [address] [place], [place] [address]    Report Status PENDING  Incomplete  Respiratory Panel by PCR     Status: None   Collection Time: 11/11/20  2:54 PM   Specimen: Nasopharyngeal Swab; Respiratory  Result Value Ref Range Status   Adenovirus NOT DETECTED NOT DETECTED Final   Coronavirus 229E NOT DETECTED NOT DETECTED Final    Comment: (NOTE) The Coronavirus on the Respiratory Panel, DOES NOT test for the novel  Coronavirus (2019 nCoV)    Coronavirus HKU1 NOT DETECTED NOT DETECTED Final   Coronavirus NL63 NOT DETECTED NOT DETECTED Final   Coronavirus OC43 NOT DETECTED NOT DETECTED Final   Metapneumovirus NOT DETECTED NOT DETECTED Final   Rhinovirus / Enterovirus NOT DETECTED NOT DETECTED Final   Influenza A NOT DETECTED NOT  DETECTED Final   Influenza B NOT DETECTED NOT DETECTED Final   Parainfluenza Virus 1 NOT DETECTED NOT DETECTED Final   Parainfluenza Virus 2 NOT DETECTED NOT DETECTED Final   Parainfluenza Virus 3 NOT DETECTED NOT DETECTED Final   Parainfluenza Virus 4 NOT DETECTED NOT DETECTED Final   Respiratory Syncytial Virus NOT DETECTED NOT DETECTED Final   Bordetella pertussis NOT DETECTED NOT DETECTED Final   Bordetella Parapertussis NOT DETECTED NOT DETECTED Final   Chlamydophila pneumoniae NOT DETECTED NOT DETECTED Final   Mycoplasma pneumoniae NOT DETECTED NOT DETECTED Final    Comment: Performed at [facility], [place]. [address], [place],  [address]    Coagulation Studies: No results for input(s): LABPROT, INR in the last 72 hours.  Urinalysis: No results for input(s): COLORURINE, LABSPEC, PHURINE, GLUCOSEU, HGBUR, BILIRUBINUR, KETONESUR, PROTEINUR, UROBILINOGEN, NITRITE, LEUKOCYTESUR in the last 72 hours.  Invalid input(s): APPERANCEUR    Imaging: CT CHEST WO CONTRAST  Result Date: 11/10/2020 CLINICAL DATA:  Decreased oxygen saturation. EXAM: CT CHEST WITHOUT CONTRAST TECHNIQUE: Multidetector CT imaging of the chest was performed following the standard protocol without IV contrast. COMPARISON:  June 25, 2020 FINDINGS: Cardiovascular: There is mild calcification of the aortic arch. Stable enlargement of the main pulmonary artery is noted (approximately 3.8 cm). Of the there is marked severity cardiomegaly with marked severity coronary artery calcification. 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 No pericardial effusion. Mediastinum/Nodes: Multiple sternal wires are seen. Mild pretracheal lymphadenopathy is noted. There is stable tracheostomy tube positioning. The thyroid gland and esophagus demonstrate no significant findings. Lungs/Pleura: Moderate severity areas of atelectasis and/or infiltrate are seen within the posterior aspect of the bilateral lower lobes and inferior aspect of the  left upper lobe. Mild involvement of the posterior aspect of the right upper lobe is also seen. Very small bilateral pleural effusions are seen. No pneumothorax is identified. Upper Abdomen: Punctate calcified granulomas are seen scattered throughout the spleen. Musculoskeletal: Multilevel degenerative changes seen throughout the thoracic spine. IMPRESSION: 1. Moderate severity bilateral lower lobe and left upper lobe atelectasis and/or infiltrate with mild involvement of the posterior aspect of the right upper lobe. 2. Very small bilateral pleural effusions. 3. Marked severity cardiomegaly with an artificial mitral valve. 4. Stable enlargement of the main pulmonary artery, which may represent pulmonary arterial hypertension. 5. Aortic atherosclerosis. Aortic Atherosclerosis (ICD10-I70.0). Electronically Signed   By: Virgina Norfolk M.D.   On: 11/10/2020 20:05     Medications:   . sodium  chloride    . sodium chloride     . amLODipine  10 mg Oral Daily  . apixaban  2.5 mg Oral BID  . aspirin  81 mg Oral Daily  . atorvastatin  80 mg Oral q1800  . carvedilol  25 mg Oral BID  . cefdinir  300 mg Oral Once  . Chlorhexidine Gluconate Cloth  6 each Topical Q0600  . escitalopram  10 mg Oral Daily  . gabapentin  300 mg Oral QHS  . insulin aspart  0-6 Units Subcutaneous TID WC  . irbesartan  150 mg Oral Daily  . pantoprazole  40 mg Oral Daily  . sevelamer carbonate  1,600 mg Oral TID WC   sodium chloride, sodium chloride, acetaminophen, alteplase, heparin, ipratropium-albuterol, lidocaine (PF), lidocaine-prilocaine, pentafluoroprop-tetrafluoroeth  Assessment/ Plan:  64 y.o. 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 ESRD MWF Schedule MWF, estimated dry weight 61.5 kg, left arm AV graft, [facility]  Patient is receiving dialysis treatment today, tolerating well We will continue Monday Wednesday Friday schedule  #. Anemia of CKD  Lab Results  Component Value Date   HGB 7.3 (L) 11/12/2020  Will consider Epogen with hemodialysis treatments  #. Secondary hyperparathyroidism of renal origin N 25.81      Component Value Date/Time   PTH 270 (H) 10/03/2017 0949   Lab Results  Component Value Date   PHOS 4.6 11/11/2020  We will continue monitoring bone mineral metabolism parameters   #. Diabetes type 2 with CKD Hgb A1c MFr Bld (%)  Date Value  11/01/2020 7.2 (H)   Continue current antihyperglycemic regimen  # Pneumonia Patient is still requiring 5 L O2 via trach collar Not in any acute respiratory distress Antibiotics per primary team  LOS: 2 Crosby Oyster 12/10/20212:51 PM  [facility], [place]

## 2020-11-13 LAB — PARATHYROID HORMONE, INTACT (NO CA): PTH: 169 pg/mL — ABNORMAL HIGH (ref 15–65)

## 2020-11-16 LAB — CULTURE, BLOOD (ROUTINE X 2)
Culture: NO GROWTH
Culture: NO GROWTH

## 2022-01-03 ENCOUNTER — Other Ambulatory Visit: Payer: Self-pay

## 2022-01-03 ENCOUNTER — Emergency Department: Payer: Medicare Other

## 2022-01-03 ENCOUNTER — Inpatient Hospital Stay
Admission: EM | Admit: 2022-01-03 | Discharge: 2022-01-17 | DRG: 291 | Disposition: A | Discharge status: Home Health | Payer: Medicare Other | Attending: Internal Medicine | Admitting: Internal Medicine

## 2022-01-03 DIAGNOSIS — Z794 Long term (current) use of insulin: Secondary | ICD-10-CM

## 2022-01-03 DIAGNOSIS — I2510 Atherosclerotic heart disease of native coronary artery without angina pectoris: Secondary | ICD-10-CM | POA: Diagnosis present

## 2022-01-03 DIAGNOSIS — Z79899 Other long term (current) drug therapy: Secondary | ICD-10-CM

## 2022-01-03 DIAGNOSIS — F32A Depression, unspecified: Secondary | ICD-10-CM | POA: Diagnosis present

## 2022-01-03 DIAGNOSIS — E1151 Type 2 diabetes mellitus with diabetic peripheral angiopathy without gangrene: Secondary | ICD-10-CM | POA: Diagnosis present

## 2022-01-03 DIAGNOSIS — Z7901 Long term (current) use of anticoagulants: Secondary | ICD-10-CM

## 2022-01-03 DIAGNOSIS — Z930 Tracheostomy status: Secondary | ICD-10-CM

## 2022-01-03 DIAGNOSIS — D638 Anemia in other chronic diseases classified elsewhere: Secondary | ICD-10-CM | POA: Diagnosis present

## 2022-01-03 DIAGNOSIS — Z833 Family history of diabetes mellitus: Secondary | ICD-10-CM

## 2022-01-03 DIAGNOSIS — E872 Acidosis, unspecified: Secondary | ICD-10-CM

## 2022-01-03 DIAGNOSIS — N186 End stage renal disease: Secondary | ICD-10-CM | POA: Diagnosis present

## 2022-01-03 DIAGNOSIS — K219 Gastro-esophageal reflux disease without esophagitis: Secondary | ICD-10-CM | POA: Diagnosis present

## 2022-01-03 DIAGNOSIS — I480 Paroxysmal atrial fibrillation: Secondary | ICD-10-CM | POA: Diagnosis present

## 2022-01-03 DIAGNOSIS — E1140 Type 2 diabetes mellitus with diabetic neuropathy, unspecified: Secondary | ICD-10-CM | POA: Diagnosis present

## 2022-01-03 DIAGNOSIS — Z7982 Long term (current) use of aspirin: Secondary | ICD-10-CM

## 2022-01-03 DIAGNOSIS — Z992 Dependence on renal dialysis: Secondary | ICD-10-CM

## 2022-01-03 DIAGNOSIS — Z20822 Contact with and (suspected) exposure to covid-19: Secondary | ICD-10-CM | POA: Diagnosis present

## 2022-01-03 DIAGNOSIS — I083 Combined rheumatic disorders of mitral, aortic and tricuspid valves: Secondary | ICD-10-CM | POA: Diagnosis present

## 2022-01-03 DIAGNOSIS — E1142 Type 2 diabetes mellitus with diabetic polyneuropathy: Secondary | ICD-10-CM | POA: Diagnosis present

## 2022-01-03 DIAGNOSIS — Z8249 Family history of ischemic heart disease and other diseases of the circulatory system: Secondary | ICD-10-CM

## 2022-01-03 DIAGNOSIS — E1165 Type 2 diabetes mellitus with hyperglycemia: Secondary | ICD-10-CM | POA: Diagnosis present

## 2022-01-03 DIAGNOSIS — J810 Acute pulmonary edema: Secondary | ICD-10-CM | POA: Diagnosis not present

## 2022-01-03 DIAGNOSIS — I5033 Acute on chronic diastolic (congestive) heart failure: Secondary | ICD-10-CM | POA: Diagnosis present

## 2022-01-03 DIAGNOSIS — Z955 Presence of coronary angioplasty implant and graft: Secondary | ICD-10-CM

## 2022-01-03 DIAGNOSIS — J9621 Acute and chronic respiratory failure with hypoxia: Secondary | ICD-10-CM | POA: Diagnosis present

## 2022-01-03 DIAGNOSIS — E871 Hypo-osmolality and hyponatremia: Secondary | ICD-10-CM | POA: Diagnosis present

## 2022-01-03 DIAGNOSIS — I5032 Chronic diastolic (congestive) heart failure: Secondary | ICD-10-CM | POA: Diagnosis present

## 2022-01-03 DIAGNOSIS — D631 Anemia in chronic kidney disease: Secondary | ICD-10-CM | POA: Diagnosis present

## 2022-01-03 DIAGNOSIS — I252 Old myocardial infarction: Secondary | ICD-10-CM

## 2022-01-03 DIAGNOSIS — E119 Type 2 diabetes mellitus without complications: Secondary | ICD-10-CM

## 2022-01-03 DIAGNOSIS — J9601 Acute respiratory failure with hypoxia: Secondary | ICD-10-CM

## 2022-01-03 DIAGNOSIS — R0602 Shortness of breath: Secondary | ICD-10-CM

## 2022-01-03 DIAGNOSIS — N2581 Secondary hyperparathyroidism of renal origin: Secondary | ICD-10-CM | POA: Diagnosis present

## 2022-01-03 DIAGNOSIS — I132 Hypertensive heart and chronic kidney disease with heart failure and with stage 5 chronic kidney disease, or end stage renal disease: Secondary | ICD-10-CM | POA: Diagnosis not present

## 2022-01-03 DIAGNOSIS — E1122 Type 2 diabetes mellitus with diabetic chronic kidney disease: Secondary | ICD-10-CM | POA: Diagnosis present

## 2022-01-03 DIAGNOSIS — E785 Hyperlipidemia, unspecified: Secondary | ICD-10-CM | POA: Diagnosis present

## 2022-01-03 DIAGNOSIS — I440 Atrioventricular block, first degree: Secondary | ICD-10-CM | POA: Diagnosis present

## 2022-01-03 DIAGNOSIS — E876 Hypokalemia: Secondary | ICD-10-CM | POA: Diagnosis present

## 2022-01-03 DIAGNOSIS — R0902 Hypoxemia: Secondary | ICD-10-CM

## 2022-01-03 DIAGNOSIS — I10 Essential (primary) hypertension: Secondary | ICD-10-CM | POA: Diagnosis present

## 2022-01-03 LAB — CBC WITH DIFFERENTIAL/PLATELET
Abs Immature Granulocytes: 0.01 10*3/uL (ref 0.00–0.07)
Basophils Absolute: 0 10*3/uL (ref 0.0–0.1)
Basophils Relative: 1 %
Eosinophils Absolute: 0.3 10*3/uL (ref 0.0–0.5)
Eosinophils Relative: 5 %
HCT: 30.7 % — ABNORMAL LOW (ref 36.0–46.0)
Hemoglobin: 10.4 g/dL — ABNORMAL LOW (ref 12.0–15.0)
Immature Granulocytes: 0 %
Lymphocytes Relative: 16 %
Lymphs Abs: 1.2 10*3/uL (ref 0.7–4.0)
MCH: 30.3 pg (ref 26.0–34.0)
MCHC: 33.9 g/dL (ref 30.0–36.0)
MCV: 89.5 fL (ref 80.0–100.0)
Monocytes Absolute: 0.8 10*3/uL (ref 0.1–1.0)
Monocytes Relative: 11 %
Neutro Abs: 4.8 10*3/uL (ref 1.7–7.7)
Neutrophils Relative %: 67 %
Platelets: 162 10*3/uL (ref 150–400)
RBC: 3.43 MIL/uL — ABNORMAL LOW (ref 3.87–5.11)
RDW: 16 % — ABNORMAL HIGH (ref 11.5–15.5)
WBC: 7.1 10*3/uL (ref 4.0–10.5)
nRBC: 0 % (ref 0.0–0.2)

## 2022-01-03 LAB — RESP PANEL BY RT-PCR (FLU A&B, COVID) ARPGX2
Influenza A by PCR: NEGATIVE
Influenza B by PCR: NEGATIVE
SARS Coronavirus 2 by RT PCR: NEGATIVE

## 2022-01-03 LAB — COMPREHENSIVE METABOLIC PANEL
ALT: 14 U/L (ref 0–44)
AST: 15 U/L (ref 15–41)
Albumin: 3.8 g/dL (ref 3.5–5.0)
Alkaline Phosphatase: 89 U/L (ref 38–126)
Anion gap: 18 — ABNORMAL HIGH (ref 5–15)
BUN: 169 mg/dL — ABNORMAL HIGH (ref 8–23)
CO2: 19 mmol/L — ABNORMAL LOW (ref 22–32)
Calcium: 8.4 mg/dL — ABNORMAL LOW (ref 8.9–10.3)
Chloride: 99 mmol/L (ref 98–111)
Creatinine, Ser: 16.04 mg/dL — ABNORMAL HIGH (ref 0.44–1.00)
GFR, Estimated: 2 mL/min — ABNORMAL LOW (ref >60)
Glucose, Bld: 128 mg/dL — ABNORMAL HIGH (ref 70–99)
Potassium: 4.9 mmol/L (ref 3.5–5.1)
Sodium: 136 mmol/L (ref 135–145)
Total Bilirubin: 0.7 mg/dL (ref 0.3–1.2)
Total Protein: 7.3 g/dL (ref 6.5–8.1)

## 2022-01-03 LAB — CBG MONITORING, ED
Glucose-Capillary: 117 mg/dL — ABNORMAL HIGH (ref 70–99)
Glucose-Capillary: 130 mg/dL — ABNORMAL HIGH (ref 70–99)

## 2022-01-03 LAB — BRAIN NATRIURETIC PEPTIDE: B Natriuretic Peptide: 887.4 pg/mL — ABNORMAL HIGH (ref 0.0–100.0)

## 2022-01-03 LAB — TROPONIN I (HIGH SENSITIVITY)
Troponin I (High Sensitivity): 14 ng/L (ref <18)
Troponin I (High Sensitivity): 16 ng/L (ref <18)

## 2022-01-03 MED ORDER — ESCITALOPRAM OXALATE 10 MG PO TABS
10.0000 mg | ORAL_TABLET | Freq: Every day | ORAL | Status: DC
  Administered 2022-01-03 – 2022-01-17 (×13): 10 mg via ORAL
  Filled 2022-01-03 (×16): qty 1

## 2022-01-03 MED ORDER — FUROSEMIDE 40 MG PO TABS
40.0000 mg | ORAL_TABLET | Freq: Every day | ORAL | Status: DC
  Administered 2022-01-03: 40 mg via ORAL
  Filled 2022-01-03: qty 1

## 2022-01-03 MED ORDER — TRAZODONE HCL 50 MG PO TABS
50.0000 mg | ORAL_TABLET | ORAL | Status: DC
  Administered 2022-01-04 – 2022-01-16 (×6): 50 mg via ORAL
  Filled 2022-01-03 (×6): qty 1

## 2022-01-03 MED ORDER — LIDOCAINE-PRILOCAINE 2.5-2.5 % EX CREA
1.0000 "application " | TOPICAL_CREAM | CUTANEOUS | Status: DC | PRN
  Filled 2022-01-03: qty 5

## 2022-01-03 MED ORDER — GABAPENTIN 300 MG PO CAPS
300.0000 mg | ORAL_CAPSULE | Freq: Every day | ORAL | Status: DC
  Administered 2022-01-03 – 2022-01-16 (×14): 300 mg via ORAL
  Filled 2022-01-03 (×14): qty 1

## 2022-01-03 MED ORDER — CARVEDILOL 25 MG PO TABS
25.0000 mg | ORAL_TABLET | Freq: Two times a day (BID) | ORAL | Status: DC
  Administered 2022-01-03 – 2022-01-17 (×23): 25 mg via ORAL
  Filled 2022-01-03 (×24): qty 1

## 2022-01-03 MED ORDER — APIXABAN 2.5 MG PO TABS
2.5000 mg | ORAL_TABLET | Freq: Two times a day (BID) | ORAL | Status: DC
  Administered 2022-01-03 – 2022-01-17 (×26): 2.5 mg via ORAL
  Filled 2022-01-03 (×29): qty 1

## 2022-01-03 MED ORDER — PENTAFLUOROPROP-TETRAFLUOROETH EX AERO
INHALATION_SPRAY | CUTANEOUS | Status: AC
  Administered 2022-01-03: 1 via TOPICAL
  Filled 2022-01-03: qty 30

## 2022-01-03 MED ORDER — ALTEPLASE 2 MG IJ SOLR
2.0000 mg | Freq: Once | INTRAMUSCULAR | Status: DC | PRN
  Filled 2022-01-03: qty 2

## 2022-01-03 MED ORDER — LIDOCAINE HCL (PF) 1 % IJ SOLN
5.0000 mL | INTRAMUSCULAR | Status: DC | PRN
  Filled 2022-01-03: qty 5

## 2022-01-03 MED ORDER — CHLORHEXIDINE GLUCONATE CLOTH 2 % EX PADS
6.0000 | MEDICATED_PAD | Freq: Every day | CUTANEOUS | Status: DC
  Administered 2022-01-03 – 2022-01-17 (×13): 6 via TOPICAL
  Filled 2022-01-03 (×3): qty 6

## 2022-01-03 MED ORDER — SODIUM CHLORIDE 0.9 % IV SOLN: 100.0000 mL | INTRAVENOUS | Status: DC | PRN

## 2022-01-03 MED ORDER — HEPARIN SODIUM (PORCINE) 1000 UNIT/ML DIALYSIS
1000.0000 [IU] | INTRAMUSCULAR | Status: DC | PRN
  Filled 2022-01-03: qty 1

## 2022-01-03 MED ORDER — PENTAFLUOROPROP-TETRAFLUOROETH EX AERO
1.0000 "application " | INHALATION_SPRAY | CUTANEOUS | Status: DC | PRN
  Filled 2022-01-03 (×2): qty 30

## 2022-01-03 MED ORDER — SEVELAMER CARBONATE 800 MG PO TABS
1600.0000 mg | ORAL_TABLET | Freq: Three times a day (TID) | ORAL | Status: DC
  Administered 2022-01-03 – 2022-01-17 (×36): 1600 mg via ORAL
  Filled 2022-01-03 (×42): qty 2

## 2022-01-03 MED ORDER — MIDODRINE HCL 5 MG PO TABS
5.0000 mg | ORAL_TABLET | ORAL | Status: DC
  Administered 2022-01-04 – 2022-01-16 (×6): 5 mg via ORAL
  Filled 2022-01-03 (×6): qty 1

## 2022-01-03 MED ORDER — ONDANSETRON 4 MG PO TBDP
4.0000 mg | ORAL_TABLET | Freq: Once | ORAL | Status: AC
  Administered 2022-01-03: 4 mg via ORAL
  Filled 2022-01-03: qty 1

## 2022-01-03 MED ORDER — AMLODIPINE BESYLATE 10 MG PO TABS
10.0000 mg | ORAL_TABLET | Freq: Every day | ORAL | Status: DC
  Administered 2022-01-03 – 2022-01-17 (×10): 10 mg via ORAL
  Filled 2022-01-03 (×3): qty 1
  Filled 2022-01-03: qty 2
  Filled 2022-01-03 (×2): qty 1
  Filled 2022-01-03: qty 2
  Filled 2022-01-03 (×5): qty 1

## 2022-01-03 MED ORDER — IRBESARTAN 150 MG PO TABS
150.0000 mg | ORAL_TABLET | Freq: Every day | ORAL | Status: DC
  Administered 2022-01-04 – 2022-01-17 (×10): 150 mg via ORAL
  Filled 2022-01-03 (×12): qty 1

## 2022-01-03 MED ORDER — ATORVASTATIN CALCIUM 20 MG PO TABS
80.0000 mg | ORAL_TABLET | Freq: Every day | ORAL | Status: DC
  Administered 2022-01-03 – 2022-01-17 (×15): 80 mg via ORAL
  Filled 2022-01-03 (×15): qty 4

## 2022-01-03 MED ORDER — ASPIRIN EC 81 MG PO TBEC
81.0000 mg | DELAYED_RELEASE_TABLET | Freq: Every day | ORAL | Status: DC
  Administered 2022-01-04 – 2022-01-17 (×13): 81 mg via ORAL
  Filled 2022-01-03 (×13): qty 1

## 2022-01-03 NOTE — ED Notes (Addendum)
Pt transported to dialysis

## 2022-01-03 NOTE — ED Notes (Signed)
PT COMING BACK TO ED FROM DIALYSIS. NURSE STS SHE REMOVED 1L. PT COMING BACK TO ED FOR DISCHARGE. WILL NEED TO CALL RIDE ONCE PT BACK TO ED.

## 2022-01-03 NOTE — ED Provider Notes (Signed)
Nephrology team is decided the patient will receive dialysis here and then be discharged   Deborah Drafts, MD 01/03/22 1358

## 2022-01-03 NOTE — TOC Progression Note (Signed)
Transition of Care [facility]) - Progression Note    Patient Details  Name: Deborah [name] MRN: [id number] Date of Birth: 1955-12-14  [facility] CM/SW Contact  Shelbie Hutching, RN Phone Number: 01/03/2022, 5:24 PM  Clinical Narrative:     RNCM spoke with patient's son, Deborah [name], about switching companies for nursing.  He reports she has missed several dialysis treatments when the nurses with [facility] call out because they don't have any back ups.   He would like for him mother to make the decision about switching companies, she is currently in dialysis and has dialysis again tomorrow.  He reports a good time to call would probably be after 3pm tomorrow.   RNCM will reach out to patient tomorrow.        Expected Discharge Plan and Services                                                 Social Determinants of Health (SDOH) Interventions    Readmission Risk Interventions Readmission Risk Prevention Plan 11/04/2020 04/02/2020 10/18/2019  Transportation Screening Complete Complete Complete  Medication Review Press photographer) (No Data) Complete -  PCP or Specialist appointment within 3-5 days of discharge Complete Complete Complete  HRI or Home Care Consult (No Data) Complete -  SW Recovery Care/Counseling Consult Complete - -  Palliative Care Screening Not Applicable - -  [place] Not Applicable Complete Not Applicable  Some recent data might be hidden

## 2022-01-03 NOTE — ED Notes (Signed)
Pt awake in room, family at bedside, no needs or concerns voiced. Call light within reach. Will continue to monitor.

## 2022-01-03 NOTE — ED Notes (Signed)
Pt resting with eyes closed. Respirations non labored and equal. NAD. Pt is waiting for dialysis later today.

## 2022-01-03 NOTE — ED Notes (Signed)
Patient still awaiting son to arrive to take her home. NAD noted. Vitals are stable. Spoke with son again who states he will be on the way when his Melburn Popper arrives.

## 2022-01-03 NOTE — ED Triage Notes (Signed)
Pt presents to ER from home c/o sob.  Pt's son gives history d/t pts struggle to speak because of trach that has been present since 11/2016.  Pt is a MWF dialysis pt and has missed two tx, with last full tx on 1/25.  Pt has to have nurse go with her to her tx and the last few days they have been calling out, leaving her unable to go.  Pt does not appear in distress at this time.  Pt A&O x4 otherwise.

## 2022-01-03 NOTE — ED Notes (Signed)
Patient to room 4 from dialysis. Report received from [place], [place]. Vitals are stable. Called pt's son, Cyndia Diver, and let him know that pt is discharged and that he can come pick her up now.

## 2022-01-03 NOTE — ED Provider Notes (Signed)
[facility] Provider Note    Event Date/Time   First MD Initiated Contact with Patient 01/03/22 [phone]     (approximate)   History   Shortness of Breath   HPI  Deborah [name] is a 66 y.o. female with history of hypertension, hyperlipidemia, diabetes, end-stage renal disease on hemodialysis Monday, Wednesday and Friday, CHF, A. fib on Eliquis, glottic stenosis status post chronic tracheostomy who presents to the emergency department for concerns for worsening shortness of breath.  She does wear oxygen at night.  She states that she has missed dialysis on Friday and Monday.  Her last dialysis was on Wednesday, January 25.  She states that a nurse is supposed to go with her to dialysis and that the nurse called out both of these days.  She denies any chest pain, fevers, cough, vomiting, diarrhea.  Patient has a AV fistula in the left upper extremity.  She dialyzes at [facility] on [address].  History provided by patient and family member.    Past Medical History:  Diagnosis Date   Atrial fibrillation ([place])    Diabetes mellitus    ESRD (end stage renal disease) ([place])    Hyperlipidemia    Hypertension    MI, old    Renal disorder    dialysls    Past Surgical History:  Procedure Laterality Date   A/V FISTULAGRAM N/A 10/05/2017   Procedure: A/V Fistulagram;  Surgeon: Katha Cabal, MD;  Location: [place] [facility];  Service: Cardiovascular;  Laterality: N/A;   A/V SHUNTOGRAM N/A 10/05/2017   Procedure: A/V SHUNTOGRAM;  Surgeon: Katha Cabal, MD;  Location: [place] CV [facility];  Service: Cardiovascular;  Laterality: N/A;   AV FISTULA PLACEMENT Left 10/02/2016   Procedure: INSERTION OF ARTERIOVENOUS (AV) GORE-TEX GRAFT ARM;  Surgeon: Waynetta Sandy, MD;  Location: [place];  Service: Vascular;  Laterality: Left;   CARDIAC CATHETERIZATION N/A 08/15/2016   Procedure: Left Heart Cath and Coronary Angiography;  Surgeon: Yolonda Kida, MD;   Location: [place] CV LAB;  Service: Cardiovascular;  Laterality: N/A;   CARDIAC CATHETERIZATION N/A 08/15/2016   Procedure: Coronary Stent Intervention;  Surgeon: Yolonda Kida, MD;  Location: [place] CV LAB;  Service: Cardiovascular;  Laterality: N/A;   CARDIAC CATHETERIZATION N/A 08/18/2016   Procedure: Right Heart Cath;  Surgeon: Jolaine Artist, MD;  Location: [place] CV LAB;  Service: Cardiovascular;  Laterality: N/A;   CARDIAC CATHETERIZATION N/A 08/18/2016   Procedure: IABP Insertion;  Surgeon: Jolaine Artist, MD;  Location: [place] CV LAB;  Service: Cardiovascular;  Laterality: N/A;   CARDIAC CATHETERIZATION Right 08/23/2016   Procedure: CENTRAL LINE INSERTION RIGHT SUBCLAVIAN;  Surgeon: Ivin Poot, MD;  Location: [place];  Service: Open Heart Surgery;  Laterality: Right;   ENDOVEIN HARVEST OF GREATER SAPHENOUS VEIN Left 08/23/2016   Procedure: ENDOVEIN HARVEST OF GREATER SAPHENOUS VEIN;  Surgeon: Ivin Poot, MD;  Location: [place];  Service: Open Heart Surgery;  Laterality: Left;   INSERTION OF DIALYSIS CATHETER Bilateral 08/31/2016   Procedure: INSERTION OF DIALYSIS CATHETER LEFT INTERNAL JUGULAR VEIN & INSERTION OF TRIPLE LUMEN RIGHT INTERNAL JUGULAR VEIN;  Surgeon: Angelia Mould, MD;  Location: [place];  Service: Vascular;  Laterality: Bilateral;   INTRAOPERATIVE TRANSESOPHAGEAL ECHOCARDIOGRAM N/A 08/23/2016   Procedure: INTRAOPERATIVE TRANSESOPHAGEAL ECHOCARDIOGRAM;  Surgeon: Ivin Poot, MD;  Location: [place];  Service: Open Heart Surgery;  Laterality: N/A;   IR GENERIC HISTORICAL  11/13/2016  IR US GUIDE VASC ACCESS LEFT 11/13/2016 Corrie Mckusick, DO [facility]   IR GENERIC HISTORICAL  11/13/2016   IR FLUORO GUIDE CV LINE LEFT 11/13/2016 Corrie Mckusick, DO [facility]   IR GENERIC HISTORICAL  11/13/2016   IR GASTROSTOMY TUBE MOD SED 11/13/2016 Corrie Mckusick, DO [facility]   MITRAL VALVE REPAIR N/A 08/23/2016   Procedure: MITRAL VALVE  REPAIR (MVR) USING 25MM EDWARDS MAGNA EASE BIOPROSTHESIS MITRAL  VALVE;  Surgeon: Ivin Poot, MD;  Location: [place];  Service: Open Heart Surgery;  Laterality: N/A;   tracheostomy reversal     TRACHEOSTOMY TUBE PLACEMENT N/A 11/09/2016   Procedure: TRACHEOSTOMY;  Surgeon: Melissa Montane, MD;  Location: [place];  Service: ENT;  Laterality: N/A;   VAGINAL DELIVERY     x 6    MEDICATIONS:  Prior to Admission medications   Medication Sig Start Date End Date Taking? Authorizing Provider  acetaminophen (TYLENOL) 325 MG tablet Take 650 mg by mouth every 6 (six) hours as needed for mild pain or fever.  04/06/17   [provider]  albuterol (PROVENTIL) (2.5 MG/3ML) 0.083% nebulizer solution Take 3 mLs (2.5 mg total) by nebulization 4 (four) times daily - after meals and at bedtime. Patient taking differently: Take 2.5 mg by nebulization 3 (three) times daily as needed.  11/01/16   Love, Ivan Anchors, PA-C  amLODipine (NORVASC) 10 MG tablet Take 10 mg by mouth daily. 08/12/20   [provider]  apixaban (ELIQUIS) 2.5 MG TABS tablet Take 2.5 mg by mouth 2 (two) times daily.  06/22/17   [provider]  aspirin 81 MG EC tablet Take 1 tablet (81 mg total) by mouth daily. 10/24/19   Swayze, Ava, DO  atorvastatin (LIPITOR) 80 MG tablet Take 80 mg by mouth daily at 6 PM.     [provider]  carvedilol (COREG) 25 MG tablet Take 25 mg by mouth 2 (two) times daily. 04/22/18   [provider]  escitalopram (LEXAPRO) 10 MG tablet Take 10 mg by mouth daily. 09/13/20   [provider]  furosemide (LASIX) 40 MG tablet Take 40 mg by mouth daily. ON NON dialysis days, Sunday, Tuesday, Thursday, Saturday    [provider]  gabapentin (NEURONTIN) 300 MG capsule Take 1 capsule (300 mg total) by mouth at bedtime. 04/04/20   Samuella Cota, MD  LEVEMIR FLEXTOUCH 100 UNIT/ML FlexPen Inject 13 Units into the skin at bedtime. 03/05/20   [provider]   lidocaine-prilocaine (EMLA) cream Apply 1 application topically every dialysis. 09/26/19   [provider]  midodrine (PROAMATINE) 5 MG tablet Take 5 mg by mouth 3 (three) times a week.    [provider]  multivitamin (RENA-VIT) TABS tablet Take 1 tablet by mouth daily. Patient not taking: Reported on 11/01/2020 08/09/20   [provider]  olmesartan (BENICAR) 20 MG tablet Take 20 mg by mouth daily. 03/12/20   [provider]  pantoprazole (PROTONIX) 40 MG tablet Take 1 tablet (40 mg total) by mouth 2 (two) times daily. 11/01/16   Love, Ivan Anchors, PA-C  sevelamer carbonate (RENVELA) 800 MG tablet Take 1,600 mg by mouth 3 (three) times daily with meals.    [provider]  traZODone (DESYREL) 50 MG tablet Take 50 mg by mouth 3 (three) times a week. Take on Dialysis days 04/04/18   [provider]    Physical Exam   Triage Vital Signs: ED Triage Vitals  Enc Vitals Group  BP 01/03/22 0018 (!) 161/64     Pulse Rate 01/03/22 0018 68     Resp 01/03/22 0018 18     Temp 01/03/22 0018 97.9 F (36.6 C)     Temp Source 01/03/22 0018 Oral     SpO2 01/03/22 0018 95 %     Weight 01/03/22 0026 141 lb 1.5 oz (64 kg)     Height 01/03/22 0026 5\' 2"  (1.575 m)     Head Circumference --      Peak Flow --      Pain Score 01/03/22 0026 0     Pain Loc --      Pain Edu? --      Excl. in [place]? --     Most recent vital signs: Vitals:   01/03/22 0542 01/03/22 0610  BP: (!) 162/71   Pulse: 64   Resp: 18   Temp:    SpO2: 97% 100%    CONSTITUTIONAL: Alert and oriented and responds appropriately to questions. Well-appearing; well-nourished HEAD: Normocephalic, atraumatic EYES: Conjunctivae clear, pupils appear equal, sclera nonicteric ENT: normal nose; moist mucous membranes NECK: Supple, normal ROM, trach in place without any bleeding or significant secretions CARD: RRR; S1 and S2 appreciated; no murmurs, no clicks, no rubs, no gallops RESP: Normal  chest excursion without splinting or tachypnea; breath sounds clear and equal bilaterally; no wheezes, no rhonchi, no rales, hypoxic to 87% on room air at rest, speaking full sentences, no distress or increased work of breathing ABD/GI: Normal bowel sounds; non-distended; soft, non-tender, no rebound, no guarding, no peritoneal signs BACK: The back appears normal EXT: Normal ROM in all joints; no deformity noted, no edema; no cyanosis SKIN: Normal color for age and race; warm; no rash on exposed skin NEURO: Moves all extremities equally, normal speech PSYCH: The patient's mood and manner are appropriate.   ED Results / Procedures / Treatments   LABS: (all labs ordered are listed, but only abnormal results are displayed) Labs Reviewed  CBC WITH DIFFERENTIAL/PLATELET - Abnormal; Notable for the following components:      Result Value   RBC 3.43 (*)    Hemoglobin 10.4 (*)    HCT 30.7 (*)    RDW 16.0 (*)    All other components within normal limits  COMPREHENSIVE METABOLIC PANEL - Abnormal; Notable for the following components:   CO2 19 (*)    Glucose, Bld 128 (*)    BUN 169 (*)    Creatinine, Ser 16.04 (*)    Calcium 8.4 (*)    GFR, Estimated 2 (*)    Anion gap 18 (*)    All other components within normal limits  BRAIN NATRIURETIC PEPTIDE - Abnormal; Notable for the following components:   B Natriuretic Peptide 887.4 (*)    All other components within normal limits  CBG MONITORING, ED - Abnormal; Notable for the following components:   Glucose-Capillary 130 (*)    All other components within normal limits  RESP PANEL BY RT-PCR (FLU A&B, COVID) [id number]  TROPONIN I (HIGH SENSITIVITY)  TROPONIN I (HIGH SENSITIVITY)     EKG:  EKG Interpretation  Date/Time:  Tuesday January 03 2022 00:16:26 EST Ventricular Rate:  70 PR Interval:  186 QRS Duration: 84 QT Interval:  426 QTC Calculation: 460 R Axis:   67 Text Interpretation: Normal sinus rhythm Normal ECG When compared with  ECG of 10-Nov-2020 12:07, No significant change was found Confirmed by Pryor Curia [phone]) on 01/03/2022 6:43:59 AM  RADIOLOGY: My personal review and interpretation of imaging: Chest x-ray shows mild pulmonary edema.  I have personally reviewed all radiology reports.   DG Chest 2 View  Result Date: 01/03/2022 CLINICAL DATA:  Shortness of breath EXAM: CHEST - 2 VIEW COMPARISON:  11/10/2020 FINDINGS: Tracheostomy tube is again noted in satisfactory position. Postsurgical changes are again seen. Cardiac shadow is enlarged. Mild central vascular congestion with is seen with minimal interstitial edema. No focal infiltrate or sizable effusion is noted. No bony abnormality is seen. IMPRESSION: Changes of mild CHF. This may be related to recently missed dialysis therapy. Electronically Signed   By: Inez Catalina M.D.   On: 01/03/2022 00:56     PROCEDURES:  Critical Care performed: Yes, see critical care procedure note(s)   CRITICAL CARE Performed by: Cyril Mourning Ellorie Kindall   Total critical care time: 45 minutes  Critical care time was exclusive of separately billable procedures and treating other patients.  Critical care was necessary to treat or prevent imminent or life-threatening deterioration.  Critical care was time spent personally by me on the following activities: development of treatment plan with patient and/or surrogate as well as nursing, discussions with consultants, evaluation of patient's response to treatment, examination of patient, obtaining history from patient or surrogate, ordering and performing treatments and interventions, ordering and review of laboratory studies, ordering and review of radiographic studies, pulse oximetry and re-evaluation of patient's condition.   Marland Kitchen1-3 Lead EKG Interpretation Performed by: Anaelle Dunton, Delice Bison, DO Authorized by: Kariyah Baugh, Delice Bison, DO     Interpretation: normal     ECG rate:  70   ECG rate assessment: normal     Rhythm: sinus rhythm      Ectopy: none     Conduction: normal      IMPRESSION / MDM / ASSESSMENT AND PLAN / ED COURSE  I reviewed the triage vital signs and the nursing notes.    Patient here with shortness of breath after 2 missed dialysis sessions.  Is due for dialysis tomorrow.  Does wear oxygen at nighttime only but is hypoxic on room air here to 87%.  The patient is on the cardiac monitor to evaluate for evidence of arrhythmia and/or significant heart rate changes.   DIFFERENTIAL DIAGNOSIS (includes but not limited to):   Pulmonary edema, pneumonia, pneumothorax, COVID-19, influenza, ACS, PE, CHF   PLAN: We will obtain labs occluding CBC, BMP, troponin.  Will obtain chest x-ray, EKG.  Anticipate discussion with nephrology.   MEDICATIONS GIVEN IN ED: Medications - No data to display   ED COURSE: Patient's labs show a metabolic acidosis secondary to uremia.  Potassium is normal.  Troponin negative.  EKG nonischemic.  She is anemic but this is significantly improved compared to previous labs in December 2021.  She is COVID and flu negative.  Chest x-ray reviewed by myself and radiologist and shows mild pulmonary edema but no other acute abnormality.  7:44 AM  Pt continues to be hemodynamically stable without respiratory distress.  She is awaiting nephrology disposition.  Signed out the oncoming ED physician.   I reviewed all nursing notes and pertinent previous records as available.  I have reviewed and interpreted any and all EKGs, lab and urine results, imaging and radiology reports (as available).   CONSULTS: Discussed case with Dr. Holley Raring on-call for nephrology.  He will get in touch with the dialysis nurse to see if they can schedule dialysis for her today as an outpatient versus dialyzing her in the ED or admitting  for dialysis.  Patient has been updated with this plan.   OUTSIDE RECORDS REVIEWED: Reviewed patient's most recent admission in December 2021 for community-acquired pneumonia and  hypoxia.         FINAL CLINICAL IMPRESSION(S) / ED DIAGNOSES   Final diagnoses:  SOB (shortness of breath)  Acute pulmonary edema ([name])  Acute respiratory failure with hypoxia ([name])  Metabolic acidosis     Rx / DC Orders   ED Discharge Orders     None        Note:  This document was prepared using Dragon voice recognition software and may include unintentional dictation errors.   Ronnie Mallette, Delice Bison, DO 01/03/22 [phone]

## 2022-01-03 NOTE — ED Notes (Signed)
Pt walked to toilet in room. NAD. Her O2 remained on 96% while she was on room air.

## 2022-01-03 NOTE — ED Notes (Signed)
Patient ambulated in hallway, oxygen saturation dropped from 96% to 87%. Assisted back to bed. Oxygen saturation back up to 95% while sitting in bed.

## 2022-01-03 NOTE — ED Notes (Signed)
Patient's daughter Deborah [name] [phone] called concerned that she has been sitting in the lobby for quite some time and is beginning to feel fatigued wants to speak to someone. Told her Triage RN was in an Emergency as well as Agricultural consultant.

## 2022-01-03 NOTE — TOC Progression Note (Signed)
Transition of Care [facility]) - Progression Note    Patient Details  Name: Deborah [name] MRN: [id number] Date of Birth: March 27, 1956  Transition of Care [facility]) CM/SW Contact  Shelbie Hutching, RN Phone Number: 01/03/2022, 3:39 PM  Clinical Narrative:    Patient does not need to be admitted to the hospital but will be getting dialysis before discharged home.  She lives at home with her son, chronic trach, chronic oxygen.  Dialysis patient MWF.  Per Elvera Bicker with [facility] patient has missed dialysis appointments lately due to the RN through [facility] not being able to go to treatments with her.  She has Medicaid PCS nursing services with [name].  RNCM left a message for son to see if he would be okay with RNCM reaching out to [facility] and maybe getting the patient a new agency for nursing services.          Expected Discharge Plan and Services                                                 Social Determinants of Health (SDOH) Interventions    Readmission Risk Interventions Readmission Risk Prevention Plan 11/04/2020 04/02/2020 10/18/2019  Transportation Screening Complete Complete Complete  Medication Review Press photographer) (No Data) Complete -  PCP or Specialist appointment within 3-5 days of discharge Complete Complete Complete  HRI or Home Care Consult (No Data) Complete -  SW Recovery Care/Counseling Consult Complete - -  Palliative Care Screening Not Applicable - -  [place] Not Applicable Complete Not Applicable  Some recent data might be hidden

## 2022-01-03 NOTE — Progress Notes (Signed)
[facility]  ROUNDING NOTE   Subjective:   Deborah [name] is a 66 year old female with a past medical history including diabetes, hyperlipidemia, hypertension, CHF, glottic stenosis status post chronic tracheostomy, and end-stage renal disease on dialysis.  Patient presents to the emergency department with complaints of shortness of breath.  Patient states she has missed the previous 2 dialysis treatments due to home health nurse inability to accompany patient to outpatient treatments.  Patient seen resting at bedside with son.  Currently satting greater than 90% on monitor.  Denies current reports of shortness of breath.  Denies nausea and vomiting.  Denies pain and discomfort.  We have been consulted to determine need for dialysis.   Objective:  Vital signs in last 24 hours:  Temp:  [97.9 F (36.6 C)] 97.9 F (36.6 C) (01/31 0018) Pulse Rate:  [61-68] 64 (01/31 1519) Resp:  [14-21] 14 (01/31 1519) BP: (133-170)/(62-88) 156/68 (01/31 1519) SpO2:  [93 %-100 %] 99 % (01/31 1519) FiO2 (%):  [28 %] 28 % (01/31 1415) Weight:  [64 kg] 64 kg (01/31 0026)  Weight change:  Filed Weights   01/03/22 0026  Weight: 64 kg    Intake/Output: No intake/output data recorded.   Intake/Output this shift:  No intake/output data recorded.  Physical Exam: General: NAD  Head: Normocephalic, atraumatic. Moist oral mucosal membranes  Eyes: Anicteric  Lungs:  Clear to auscultation, normal effort, midline trach  Heart: Regular rate and rhythm  Abdomen:  Soft, nontender  Extremities: No peripheral edema.  Neurologic: Nonfocal, moving all four extremities  Skin: No lesions  Access: Left aVF    Basic Metabolic Panel: Recent Labs  Lab 01/03/22 0020  NA 136  K 4.9  CL 99  CO2 19*  GLUCOSE 128*  BUN 169*  CREATININE 16.04*  CALCIUM 8.4*    Liver Function Tests: Recent Labs  Lab 01/03/22 0020  AST 15  ALT 14  ALKPHOS 89  BILITOT 0.7  PROT 7.3  ALBUMIN 3.8   No  results for input(s): LIPASE, AMYLASE in the last 168 hours. No results for input(s): AMMONIA in the last 168 hours.  CBC: Recent Labs  Lab 01/03/22 0020  WBC 7.1  NEUTROABS 4.8  HGB 10.4*  HCT 30.7*  MCV 89.5  PLT 162    Cardiac Enzymes: No results for input(s): CKTOTAL, CKMB, CKMBINDEX, TROPONINI in the last 168 hours.  BNP: Invalid input(s): POCBNP  CBG: Recent Labs  Lab 01/03/22 0034  GLUCAP 130*    Microbiology: Results for orders placed or performed during the hospital encounter of 01/03/22  Resp Panel by RT-PCR (Flu A&B, Covid) Nasopharyngeal Swab     Status: None   Collection Time: 01/03/22 12:31 AM   Specimen: Nasopharyngeal Swab; Nasopharyngeal(NP) swabs in vial transport medium  Result Value Ref Range Status   SARS Coronavirus 2 by RT PCR NEGATIVE NEGATIVE Final    Comment: (NOTE) SARS-CoV-2 target nucleic acids are NOT DETECTED.  The SARS-CoV-2 RNA is generally detectable in upper respiratory specimens during the acute phase of infection. The lowest concentration of SARS-CoV-2 viral copies this assay can detect is 138 copies/mL. A negative result does not preclude SARS-Cov-2 infection and should not be used as the sole basis for treatment or other patient management decisions. A negative result may occur with  improper specimen collection/handling, submission of specimen other than nasopharyngeal swab, presence of viral mutation(s) within the areas targeted by this assay, and inadequate number of viral copies(<138 copies/mL). A negative result must be  combined with clinical observations, patient history, and epidemiological information. The expected result is Negative.  Fact Sheet for Patients:  EntrepreneurPulse.com.au  Fact Sheet for Healthcare Providers:  IncredibleEmployment.be  This test is no t yet approved or cleared by the [place] FDA and  has been authorized for detection and/or diagnosis of  SARS-CoV-2 by FDA under an Emergency Use Authorization (EUA). This EUA will remain  in effect (meaning this test can be used) for the duration of the COVID-19 declaration under Section 564(b)(1) of the Act, 21 U.S.C.section 360bbb-3(b)(1), unless the authorization is terminated  or revoked sooner.       Influenza A by PCR NEGATIVE NEGATIVE Final   Influenza B by PCR NEGATIVE NEGATIVE Final    Comment: (NOTE) The Xpert Xpress SARS-CoV-2/FLU/RSV plus assay is intended as an aid in the diagnosis of influenza from Nasopharyngeal swab specimens and should not be used as a sole basis for treatment. Nasal washings and aspirates are unacceptable for Xpert Xpress SARS-CoV-2/FLU/RSV testing.  Fact Sheet for Patients: EntrepreneurPulse.com.au  Fact Sheet for Healthcare Providers: IncredibleEmployment.be  This test is not yet approved or cleared by the [place] FDA and has been authorized for detection and/or diagnosis of SARS-CoV-2 by FDA under an Emergency Use Authorization (EUA). This EUA will remain in effect (meaning this test can be used) for the duration of the COVID-19 declaration under Section 564(b)(1) of the Act, 21 U.S.C. section 360bbb-3(b)(1), unless the authorization is terminated or revoked.  Performed at [facility], [address] [place], [place] [address]     Coagulation Studies: No results for input(s): LABPROT, INR in the last 72 hours.  Urinalysis: No results for input(s): COLORURINE, LABSPEC, PHURINE, GLUCOSEU, HGBUR, BILIRUBINUR, KETONESUR, PROTEINUR, UROBILINOGEN, NITRITE, LEUKOCYTESUR in the last 72 hours.  Invalid input(s): APPERANCEUR    Imaging: DG Chest 2 View  Result Date: 01/03/2022 CLINICAL DATA:  Shortness of breath EXAM: CHEST - 2 VIEW COMPARISON:  11/10/2020 FINDINGS: Tracheostomy tube is again noted in satisfactory position. Postsurgical changes are again seen. Cardiac shadow is enlarged.  Mild central vascular congestion with is seen with minimal interstitial edema. No focal infiltrate or sizable effusion is noted. No bony abnormality is seen. IMPRESSION: Changes of mild CHF. This may be related to recently missed dialysis therapy. Electronically Signed   By: Inez Catalina M.D.   On: 01/03/2022 00:56     Medications:    sodium chloride     sodium chloride      amLODipine  10 mg Oral Daily   apixaban  2.5 mg Oral BID   [START ON 01/04/2022] aspirin EC  81 mg Oral Daily   atorvastatin  80 mg Oral q1800   carvedilol  25 mg Oral BID   Chlorhexidine Gluconate Cloth  6 each Topical Q0600   escitalopram  10 mg Oral Daily   furosemide  40 mg Oral Daily   gabapentin  300 mg Oral QHS   [START ON 01/04/2022] irbesartan  150 mg Oral Daily   [START ON 01/04/2022] midodrine  5 mg Oral Once per day on Mon Wed Fri   sevelamer carbonate  1,600 mg Oral TID WC   [START ON 01/04/2022] traZODone  50 mg Oral Once per day on Mon Wed Fri   sodium chloride, sodium chloride, alteplase, heparin, lidocaine (PF), lidocaine-prilocaine, pentafluoroprop-tetrafluoroeth  Assessment/ Plan:  [name] is a 66 y.o.  female with a past medical history including diabetes, hyperlipidemia, hypertension, CHF, glottic stenosis status post chronic tracheostomy, and  end-stage renal disease on dialysis.  Patient presents to the emergency department with complaints of shortness of breath.  Patient states she has missed the previous 2 dialysis treatments due to home health nurse inability to accompany patient to outpatient treatments.   [facility] [address]/MWF/Lt AVF  Respiratory distress with end-stage renal disease on dialysis.  Reports missing last 2 dialysis treatments.  Attempted to reschedule outpatient clinic visit, unable to find nurse to accompany patient due to outpatient clinic policy of trach placement.  Plan to dialyze patient.  Patient cleared to discharge after dialysis treatment is stable.  2. Anemia of  chronic kidney disease Lab Results  Component Value Date   HGB 10.4 (L) 01/03/2022  Hemoglobin within acceptable range.  We will continue to monitor  3. Secondary Hyperparathyroidism:  Lab Results  Component Value Date   PTH 169 (H) 11/12/2020   CALCIUM 8.4 (L) 01/03/2022   CAION 1.09 (L) 09/01/2016   PHOS 4.5 11/12/2020    Calcium within acceptable range.  We will continue to monitor   LOS: 0   1/31/20233:54 PM

## 2022-01-04 ENCOUNTER — Encounter: Payer: Self-pay | Admitting: Family Medicine

## 2022-01-04 ENCOUNTER — Emergency Department: Payer: Medicare Other

## 2022-01-04 ENCOUNTER — Observation Stay (HOSPITAL_COMMUNITY)
Admit: 2022-01-04 | Discharge: 2022-01-04 | Disposition: A | Discharge status: Home / Self Care | Payer: Medicare Other | Attending: Family Medicine | Admitting: Family Medicine

## 2022-01-04 DIAGNOSIS — J810 Acute pulmonary edema: Secondary | ICD-10-CM

## 2022-01-04 DIAGNOSIS — Z992 Dependence on renal dialysis: Secondary | ICD-10-CM | POA: Diagnosis not present

## 2022-01-04 DIAGNOSIS — I083 Combined rheumatic disorders of mitral, aortic and tricuspid valves: Secondary | ICD-10-CM | POA: Diagnosis present

## 2022-01-04 DIAGNOSIS — I480 Paroxysmal atrial fibrillation: Secondary | ICD-10-CM | POA: Diagnosis present

## 2022-01-04 DIAGNOSIS — E1122 Type 2 diabetes mellitus with diabetic chronic kidney disease: Secondary | ICD-10-CM | POA: Diagnosis present

## 2022-01-04 DIAGNOSIS — E871 Hypo-osmolality and hyponatremia: Secondary | ICD-10-CM | POA: Diagnosis present

## 2022-01-04 DIAGNOSIS — J9621 Acute and chronic respiratory failure with hypoxia: Secondary | ICD-10-CM

## 2022-01-04 DIAGNOSIS — E1142 Type 2 diabetes mellitus with diabetic polyneuropathy: Secondary | ICD-10-CM | POA: Diagnosis present

## 2022-01-04 DIAGNOSIS — N186 End stage renal disease: Secondary | ICD-10-CM

## 2022-01-04 DIAGNOSIS — I5031 Acute diastolic (congestive) heart failure: Secondary | ICD-10-CM | POA: Diagnosis not present

## 2022-01-04 DIAGNOSIS — I252 Old myocardial infarction: Secondary | ICD-10-CM | POA: Diagnosis not present

## 2022-01-04 DIAGNOSIS — E876 Hypokalemia: Secondary | ICD-10-CM | POA: Diagnosis present

## 2022-01-04 DIAGNOSIS — I440 Atrioventricular block, first degree: Secondary | ICD-10-CM | POA: Diagnosis present

## 2022-01-04 DIAGNOSIS — Z955 Presence of coronary angioplasty implant and graft: Secondary | ICD-10-CM | POA: Diagnosis not present

## 2022-01-04 DIAGNOSIS — Z8249 Family history of ischemic heart disease and other diseases of the circulatory system: Secondary | ICD-10-CM | POA: Diagnosis not present

## 2022-01-04 DIAGNOSIS — N2581 Secondary hyperparathyroidism of renal origin: Secondary | ICD-10-CM | POA: Diagnosis present

## 2022-01-04 DIAGNOSIS — Z833 Family history of diabetes mellitus: Secondary | ICD-10-CM | POA: Diagnosis not present

## 2022-01-04 DIAGNOSIS — I5033 Acute on chronic diastolic (congestive) heart failure: Secondary | ICD-10-CM

## 2022-01-04 DIAGNOSIS — E1151 Type 2 diabetes mellitus with diabetic peripheral angiopathy without gangrene: Secondary | ICD-10-CM | POA: Diagnosis present

## 2022-01-04 DIAGNOSIS — I5032 Chronic diastolic (congestive) heart failure: Secondary | ICD-10-CM | POA: Diagnosis present

## 2022-01-04 DIAGNOSIS — D631 Anemia in chronic kidney disease: Secondary | ICD-10-CM | POA: Diagnosis present

## 2022-01-04 DIAGNOSIS — F32A Depression, unspecified: Secondary | ICD-10-CM | POA: Diagnosis present

## 2022-01-04 DIAGNOSIS — Z930 Tracheostomy status: Secondary | ICD-10-CM | POA: Diagnosis not present

## 2022-01-04 DIAGNOSIS — E785 Hyperlipidemia, unspecified: Secondary | ICD-10-CM | POA: Diagnosis present

## 2022-01-04 DIAGNOSIS — I132 Hypertensive heart and chronic kidney disease with heart failure and with stage 5 chronic kidney disease, or end stage renal disease: Secondary | ICD-10-CM | POA: Diagnosis present

## 2022-01-04 DIAGNOSIS — E1165 Type 2 diabetes mellitus with hyperglycemia: Secondary | ICD-10-CM | POA: Diagnosis present

## 2022-01-04 DIAGNOSIS — Z20822 Contact with and (suspected) exposure to covid-19: Secondary | ICD-10-CM | POA: Diagnosis present

## 2022-01-04 HISTORY — DX: Acute on chronic diastolic (congestive) heart failure: I50.33

## 2022-01-04 LAB — CBC WITH DIFFERENTIAL/PLATELET
Abs Immature Granulocytes: 0.03 10*3/uL (ref 0.00–0.07)
Basophils Absolute: 0 10*3/uL (ref 0.0–0.1)
Basophils Relative: 0 %
Eosinophils Absolute: 0.1 10*3/uL (ref 0.0–0.5)
Eosinophils Relative: 1 %
HCT: 29.3 % — ABNORMAL LOW (ref 36.0–46.0)
Hemoglobin: 10 g/dL — ABNORMAL LOW (ref 12.0–15.0)
Immature Granulocytes: 0 %
Lymphocytes Relative: 6 %
Lymphs Abs: 0.5 10*3/uL — ABNORMAL LOW (ref 0.7–4.0)
MCH: 29.7 pg (ref 26.0–34.0)
MCHC: 34.1 g/dL (ref 30.0–36.0)
MCV: 86.9 fL (ref 80.0–100.0)
Monocytes Absolute: 0.5 10*3/uL (ref 0.1–1.0)
Monocytes Relative: 6 %
Neutro Abs: 6.8 10*3/uL (ref 1.7–7.7)
Neutrophils Relative %: 87 %
Platelets: 163 10*3/uL (ref 150–400)
RBC: 3.37 MIL/uL — ABNORMAL LOW (ref 3.87–5.11)
RDW: 15.9 % — ABNORMAL HIGH (ref 11.5–15.5)
WBC: 7.8 10*3/uL (ref 4.0–10.5)
nRBC: 0 % (ref 0.0–0.2)

## 2022-01-04 LAB — ECHOCARDIOGRAM COMPLETE
AR max vel: 1.51 cm2
AV Area VTI: 1.61 cm2
AV Area mean vel: 1.52 cm2
AV Mean grad: 6 mmHg
AV Peak grad: 10.9 mmHg
Ao pk vel: 1.65 m/s
Area-P 1/2: 3.46 cm2
Height: 62 in
MV VTI: 1.02 cm2
S' Lateral: 2.54 cm
Weight: 2366.86 oz

## 2022-01-04 LAB — BRAIN NATRIURETIC PEPTIDE: B Natriuretic Peptide: 785.5 pg/mL — ABNORMAL HIGH (ref 0.0–100.0)

## 2022-01-04 LAB — BASIC METABOLIC PANEL
Anion gap: 11 (ref 5–15)
BUN: 69 mg/dL — ABNORMAL HIGH (ref 8–23)
CO2: 29 mmol/L (ref 22–32)
Calcium: 8.3 mg/dL — ABNORMAL LOW (ref 8.9–10.3)
Chloride: 93 mmol/L — ABNORMAL LOW (ref 98–111)
Creatinine, Ser: 8.65 mg/dL — ABNORMAL HIGH (ref 0.44–1.00)
GFR, Estimated: 5 mL/min — ABNORMAL LOW (ref >60)
Glucose, Bld: 232 mg/dL — ABNORMAL HIGH (ref 70–99)
Potassium: 4.3 mmol/L (ref 3.5–5.1)
Sodium: 133 mmol/L — ABNORMAL LOW (ref 135–145)

## 2022-01-04 LAB — HIV ANTIBODY (ROUTINE TESTING W REFLEX): HIV Screen 4th Generation wRfx: NONREACTIVE

## 2022-01-04 LAB — TROPONIN I (HIGH SENSITIVITY): Troponin I (High Sensitivity): 43 ng/L — ABNORMAL HIGH (ref <18)

## 2022-01-04 LAB — GLUCOSE, CAPILLARY: Glucose-Capillary: 99 mg/dL (ref 70–99)

## 2022-01-04 LAB — MAGNESIUM: Magnesium: 1.5 mg/dL — ABNORMAL LOW (ref 1.7–2.4)

## 2022-01-04 MED ORDER — ASPIRIN 81 MG PO CHEW
324.0000 mg | CHEWABLE_TABLET | Freq: Once | ORAL | Status: AC
  Administered 2022-01-04: 324 mg via ORAL
  Filled 2022-01-04: qty 4

## 2022-01-04 MED ORDER — MAGNESIUM HYDROXIDE 400 MG/5ML PO SUSP
30.0000 mL | Freq: Every day | ORAL | Status: DC | PRN
  Filled 2022-01-04: qty 30

## 2022-01-04 MED ORDER — FUROSEMIDE 10 MG/ML IJ SOLN
60.0000 mg | Freq: Two times a day (BID) | INTRAMUSCULAR | Status: DC
  Administered 2022-01-04 – 2022-01-05 (×3): 60 mg via INTRAVENOUS
  Filled 2022-01-04 (×2): qty 6
  Filled 2022-01-04: qty 8

## 2022-01-04 MED ORDER — ONDANSETRON HCL 4 MG/2ML IJ SOLN
4.0000 mg | Freq: Once | INTRAMUSCULAR | Status: AC
  Administered 2022-01-04: 4 mg via INTRAVENOUS
  Filled 2022-01-04: qty 2

## 2022-01-04 MED ORDER — ALBUTEROL SULFATE (2.5 MG/3ML) 0.083% IN NEBU
2.5000 mg | INHALATION_SOLUTION | Freq: Three times a day (TID) | RESPIRATORY_TRACT | Status: DC
  Administered 2022-01-05 – 2022-01-12 (×21): 2.5 mg via RESPIRATORY_TRACT
  Filled 2022-01-04 (×24): qty 3

## 2022-01-04 MED ORDER — ONDANSETRON HCL 4 MG PO TABS
4.0000 mg | ORAL_TABLET | Freq: Four times a day (QID) | ORAL | Status: DC | PRN
  Administered 2022-01-11: 4 mg via ORAL
  Filled 2022-01-04: qty 1

## 2022-01-04 MED ORDER — ACETAMINOPHEN 650 MG RE SUPP: 650.0000 mg | Freq: Four times a day (QID) | RECTAL | Status: DC | PRN

## 2022-01-04 MED ORDER — TRAZODONE HCL 50 MG PO TABS
25.0000 mg | ORAL_TABLET | Freq: Every evening | ORAL | Status: DC | PRN
  Administered 2022-01-06 – 2022-01-14 (×8): 25 mg via ORAL
  Filled 2022-01-04 (×9): qty 1

## 2022-01-04 MED ORDER — ONDANSETRON HCL 4 MG/2ML IJ SOLN: 4.0000 mg | Freq: Four times a day (QID) | INTRAMUSCULAR | Status: DC | PRN

## 2022-01-04 MED ORDER — PENTAFLUOROPROP-TETRAFLUOROETH EX AERO
INHALATION_SPRAY | CUTANEOUS | Status: AC
  Filled 2022-01-04: qty 30

## 2022-01-04 MED ORDER — ACETAMINOPHEN 500 MG PO TABS
1000.0000 mg | ORAL_TABLET | Freq: Once | ORAL | Status: AC
Start: 2022-01-04 — End: 2022-01-04
  Administered 2022-01-04: 1000 mg via ORAL
  Filled 2022-01-04: qty 2

## 2022-01-04 MED ORDER — ACETAMINOPHEN 325 MG PO TABS: 650.0000 mg | ORAL_TABLET | Freq: Four times a day (QID) | ORAL | Status: DC | PRN

## 2022-01-04 NOTE — Progress Notes (Signed)
*  PRELIMINARY RESULTS* Echocardiogram 2D Echocardiogram has been performed.  Deborah [name] Deborah [name] Deborah [name] 01/04/2022, 12:02 PM

## 2022-01-04 NOTE — ED Notes (Signed)
Informed dialysis that pt has a bed 105. Dialysis will take pt to inpatient room when dialysis is finish. Report given to floor, bed shows ready.

## 2022-01-04 NOTE — H&P (Signed)
[place]   PATIENT NAME: Deborah [name]    MR#:  [id number]  DATE OF BIRTH:  [birth year]  DATE OF ADMISSION:  01/03/2022  PRIMARY CARE PHYSICIAN: Leta Speller, MD   Patient is coming from: Home  REQUESTING/REFERRING PHYSICIAN: Ward, Burna Forts, DO  CHIEF COMPLAINT:   Chief Complaint  Patient presents with   Shortness of Breath    HISTORY OF PRESENT ILLNESS:  Deborah [name] is a 66 y.o. female with medical history significant for atrial fibrillation, type 2 diabetes mellitus, end-stage renal disease on hemodialysis on MWF, hypertension and dyslipidemia, who presented to the emergency room with acute onset of worsening dyspnea with associated cough productive of clear sputum and occasional wheezing.  She apparently missed last hemodialysis session.  Contact was made with Dr. Holley Raring yesterday and the patient underwent hemodialysis in the ER and was then discharged.  She has been having worsening dyspnea while she was in the ER awaiting discharge.  She then dropped her pulse oximetry to 85 requiring 5 L of O2 via trach collar compared to her baseline 2 L.  She continues to make small amount of urine urine output.  No nausea or vomiting or abdominal pain.  No fever or chills.  No chest pain or palpitations.  ED Course: Upon presentation to the emergency room, BP was 162/95 but otherwise normal vital signs.  CBC should anemia close to baseline and BMP revealed mild hyponatremia hypokalemia with hyperglycemia 232, BUN of 69 and creatinine of 8.65 with calcium of 8.3.  Magnesium was 1.5 and BNP 785.5.  Influenza antigens and COVID-19 PCR came back negative. EKG as reviewed by me : EKG showed normal sinus rhythm with a rate of 79 with first-degree AV block and possible left atrial enlargement and prolonged QT interval with QTc of 497 MS. Imaging: Portable chest x-ray showed worsening changes of CHF.  The patient will be admitted to a PCU observation bed for further evaluation and  management. PAST MEDICAL HISTORY:   Past Medical History:  Diagnosis Date   Atrial fibrillation ([place])    Diabetes mellitus    ESRD (end stage renal disease) ([place])    Hyperlipidemia    Hypertension    MI, old    Renal disorder    dialysls  -Mitral valve prolapse - Diastolic CHF  PAST SURGICAL HISTORY:   Past Surgical History:  Procedure Laterality Date   A/V FISTULAGRAM N/A 10/05/2017   Procedure: A/V Fistulagram;  Surgeon: Katha Cabal, MD;  Location: [place] [facility];  Service: Cardiovascular;  Laterality: N/A;   A/V SHUNTOGRAM N/A 10/05/2017   Procedure: A/V SHUNTOGRAM;  Surgeon: Katha Cabal, MD;  Location: [place] [facility];  Service: Cardiovascular;  Laterality: N/A;   AV FISTULA PLACEMENT Left 10/02/2016   Procedure: INSERTION OF ARTERIOVENOUS (AV) GORE-TEX GRAFT ARM;  Surgeon: Waynetta Sandy, MD;  Location: [place];  Service: Vascular;  Laterality: Left;   CARDIAC CATHETERIZATION N/A 08/15/2016   Procedure: Left Heart Cath and Coronary Angiography;  Surgeon: Yolonda Kida, MD;  Location: [place] [facility];  Service: Cardiovascular;  Laterality: N/A;   CARDIAC CATHETERIZATION N/A 08/15/2016   Procedure: Coronary Stent Intervention;  Surgeon: Yolonda Kida, MD;  Location: [place] [facility];  Service: Cardiovascular;  Laterality: N/A;   CARDIAC CATHETERIZATION N/A 08/18/2016   Procedure: Right Heart Cath;  Surgeon: Jolaine Artist, MD;  Location: [place] [facility];  Service: Cardiovascular;  Laterality: N/A;   CARDIAC  CATHETERIZATION N/A 08/18/2016   Procedure: IABP Insertion;  Surgeon: Jolaine Artist, MD;  Location: [place] [facility];  Service: Cardiovascular;  Laterality: N/A;   CARDIAC CATHETERIZATION Right 08/23/2016   Procedure: CENTRAL LINE INSERTION RIGHT SUBCLAVIAN;  Surgeon: Ivin Poot, MD;  Location: [place];  Service: Open Heart Surgery;  Laterality: Right;   ENDOVEIN HARVEST OF GREATER SAPHENOUS VEIN Left 08/23/2016    Procedure: ENDOVEIN HARVEST OF GREATER SAPHENOUS VEIN;  Surgeon: Ivin Poot, MD;  Location: [place];  Service: Open Heart Surgery;  Laterality: Left;   INSERTION OF DIALYSIS CATHETER Bilateral 08/31/2016   Procedure: INSERTION OF DIALYSIS CATHETER LEFT INTERNAL JUGULAR VEIN & INSERTION OF TRIPLE LUMEN RIGHT INTERNAL JUGULAR VEIN;  Surgeon: Angelia Mould, MD;  Location: [place];  Service: Vascular;  Laterality: Bilateral;   INTRAOPERATIVE TRANSESOPHAGEAL ECHOCARDIOGRAM N/A 08/23/2016   Procedure: INTRAOPERATIVE TRANSESOPHAGEAL ECHOCARDIOGRAM;  Surgeon: Ivin Poot, MD;  Location: [place];  Service: Open Heart Surgery;  Laterality: N/A;   IR GENERIC HISTORICAL  11/13/2016   IR US GUIDE VASC ACCESS LEFT 11/13/2016 Corrie Mckusick, DO [facility]   IR GENERIC HISTORICAL  11/13/2016   IR FLUORO GUIDE CV LINE LEFT 11/13/2016 Corrie Mckusick, DO [facility]   IR GENERIC HISTORICAL  11/13/2016   IR GASTROSTOMY TUBE MOD SED 11/13/2016 Corrie Mckusick, DO [facility]   MITRAL VALVE REPAIR N/A 08/23/2016   Procedure: MITRAL VALVE REPAIR (MVR) USING 25MM EDWARDS MAGNA EASE BIOPROSTHESIS MITRAL  VALVE;  Surgeon: Ivin Poot, MD;  Location: [place];  Service: Open Heart Surgery;  Laterality: N/A;   tracheostomy reversal     TRACHEOSTOMY TUBE PLACEMENT N/A 11/09/2016   Procedure: TRACHEOSTOMY;  Surgeon: Melissa Montane, MD;  [facility];  Service: ENT;  Laterality: N/A;   VAGINAL DELIVERY     x 6    SOCIAL HISTORY:   Social History   Tobacco Use   Smoking status: Never   Smokeless tobacco: Never  Substance Use Topics   Alcohol use: No    FAMILY HISTORY:   Family History  Problem Relation Age of Onset   Hypertension Mother    Diabetes Mother    Breast cancer Sister    Hypertension Father     DRUG ALLERGIES:  No Known Allergies  REVIEW OF SYSTEMS:   ROS As per history of present illness. All pertinent systems were reviewed above. Constitutional, HEENT, cardiovascular, respiratory,  GI, GU, musculoskeletal, neuro, psychiatric, endocrine, integumentary and hematologic systems were reviewed and are otherwise negative/unremarkable except for positive findings mentioned above in the HPI.   MEDICATIONS AT HOME:   Prior to Admission medications   Medication Sig Start Date End Date Taking? Authorizing Provider  acetaminophen (TYLENOL) 325 MG tablet Take 650 mg by mouth every 6 (six) hours as needed for mild pain or fever.  04/06/17   [provider]  albuterol (PROVENTIL) (2.5 MG/3ML) 0.083% nebulizer solution Take 3 mLs (2.5 mg total) by nebulization 4 (four) times daily - after meals and at bedtime. Patient taking differently: Take 2.5 mg by nebulization 3 (three) times daily as needed.  11/01/16   Love, Ivan Anchors, PA-C  amLODipine (NORVASC) 10 MG tablet Take 10 mg by mouth daily. 08/12/20   [provider]  apixaban (ELIQUIS) 2.5 MG TABS tablet Take 2.5 mg by mouth 2 (two) times daily.  06/22/17   [provider]  aspirin 81 MG EC tablet Take 1 tablet (81 mg total) by mouth daily. 10/24/19  Swayze, Ava, DO  atorvastatin (LIPITOR) 80 MG tablet Take 80 mg by mouth daily at 6 PM.     [provider]  carvedilol (COREG) 25 MG tablet Take 25 mg by mouth 2 (two) times daily. 04/22/18   [provider]  escitalopram (LEXAPRO) 10 MG tablet Take 10 mg by mouth daily. 09/13/20   [provider]  furosemide (LASIX) 40 MG tablet Take 40 mg by mouth daily. ON NON dialysis days, Sunday, Tuesday, Thursday, Saturday    [provider]  gabapentin (NEURONTIN) 300 MG capsule Take 1 capsule (300 mg total) by mouth at bedtime. 04/04/20   Samuella Cota, MD  LEVEMIR FLEXTOUCH 100 UNIT/ML FlexPen Inject 13 Units into the skin at bedtime. 03/05/20   [provider]  lidocaine-prilocaine (EMLA) cream Apply 1 application topically every dialysis. 09/26/19   [provider]  midodrine (PROAMATINE) 5 MG tablet Take 5 mg by mouth 3  (three) times a week.    [provider]  multivitamin (RENA-VIT) TABS tablet Take 1 tablet by mouth daily. Patient not taking: Reported on 11/01/2020 08/09/20   [provider]  olmesartan (BENICAR) 20 MG tablet Take 20 mg by mouth daily. 03/12/20   [provider]  pantoprazole (PROTONIX) 40 MG tablet Take 1 tablet (40 mg total) by mouth 2 (two) times daily. 11/01/16   Love, Ivan Anchors, PA-C  sevelamer carbonate (RENVELA) 800 MG tablet Take 1,600 mg by mouth 3 (three) times daily with meals.    [provider]  traZODone (DESYREL) 50 MG tablet Take 50 mg by mouth 3 (three) times a week. Take on Dialysis days 04/04/18   [provider]      VITAL SIGNS:  Blood pressure 139/65, pulse 73, temperature 98.4 F (36.9 C), temperature source Oral, resp. rate 19, height 5\' 2"  (1.575 m), weight 67.1 kg, SpO2 94 %.  PHYSICAL EXAMINATION:  Physical Exam  GENERAL:  66 y.o.-year-old female patient lying in the bed with mild respiratory distress with conversational dyspnea. EYES: Pupils equal, round, reactive to light and accommodation. No scleral icterus. Extraocular muscles intact.  HEENT: Head atraumatic, normocephalic. Oropharynx and nasopharynx clear.  NECK:  Supple, no jugular venous distention. No thyroid enlargement, no tenderness.  Trachea collar intact. LUNGS: Diminished bibasilar breath sounds with bibasilar rales.   CARDIOVASCULAR: Regular rate and rhythm, S1, S2 normal. No murmurs, rubs, or gallops.  ABDOMEN: Soft, nondistended, nontender. Bowel sounds present. No organomegaly or mass.  EXTREMITIES: No pedal edema, cyanosis, or clubbing.  NEUROLOGIC: Cranial nerves II through XII are intact. Muscle strength 5/5 in all extremities. Sensation intact. Gait not checked.  PSYCHIATRIC: The patient is alert and oriented x 3.  Normal affect and good eye contact. SKIN: No obvious rash, lesion, or ulcer.   LABORATORY PANEL:   CBC Recent Labs  Lab  01/04/22 0238  WBC 7.8  HGB 10.0*  HCT 29.3*  PLT 163   ------------------------------------------------------------------------------------------------------------------  Chemistries  Recent Labs  Lab 01/03/22 0020 01/04/22 0236 01/04/22 0238  NA 136  --  133*  K 4.9  --  4.3  CL 99  --  93*  CO2 19*  --  29  GLUCOSE 128*  --  232*  BUN 169*  --  69*  CREATININE 16.04*  --  8.65*  CALCIUM 8.4*  --  8.3*  MG  --  1.5*  --   AST 15  --   --   ALT 14  --   --  ALKPHOS 89  --   --   BILITOT 0.7  --   --    ------------------------------------------------------------------------------------------------------------------  Cardiac Enzymes No results for input(s): TROPONINI in the last 168 hours. ------------------------------------------------------------------------------------------------------------------  RADIOLOGY:  DG Chest 2 View  Result Date: 01/03/2022 CLINICAL DATA:  Shortness of breath EXAM: CHEST - 2 VIEW COMPARISON:  11/10/2020 FINDINGS: Tracheostomy tube is again noted in satisfactory position. Postsurgical changes are again seen. Cardiac shadow is enlarged. Mild central vascular congestion with is seen with minimal interstitial edema. No focal infiltrate or sizable effusion is noted. No bony abnormality is seen. IMPRESSION: Changes of mild CHF. This may be related to recently missed dialysis therapy. Electronically Signed   By: Inez Catalina M.D.   On: 01/03/2022 00:56   DG Chest Port 1 View  Result Date: 01/04/2022 CLINICAL DATA:  Increasing hypoxia EXAM: PORTABLE CHEST 1 VIEW COMPARISON:  01/03/22 FINDINGS: Cardiac shadow is enlarged but stable. Postsurgical changes are noted. Tracheostomy tube is noted in satisfactory position. Some increasing vascular congestion is noted without significant interstitial edema. No bony abnormality is noted. IMPRESSION: Worsening changes of CHF. Electronically Signed   By: Inez Catalina M.D.   On: 01/04/2022 02:00      IMPRESSION  AND PLAN:  Principal Problem:   Acute pulmonary edema ([place])  1.  Acute pulmonary edema due to acute on chronic diastolic CHF and fluid overload in the setting of end-stage renal disease on hemodialysis.  The patient has subsequent acute on chronic hypoxic respiratory failure. - The patient will be admitted to a PCU bed. - We will place on IV Lasix for pulmonary congestion rather than diuresis. - Nephrology consult will be obtained. - Dr. Holley Raring was notified about the patient and will plan on hemodialysis later today. - We will obtain a 2D echo and a cardiology consult by Dr. Corky Sox.  2.  Paroxysmal atrial fibrillation. - We will continue Eliquis and Coreg.  3.  Dyslipidemia. - We will continue statin therapy.  4.  Essential hypertension. - We will continue Coreg and Benicar.  5.  GERD. - We will continue PPI therapy.  6.  Type 2 diabetes mellitus with peripheral neuropathy. - The patient will be placed on supplement coverage with NovoLog and will continue his basal coverage. - We will continue Neurontin  7.  Depression. - We will continue Lexapro and trazodone.  DVT prophylaxis: Eliquis. Code Status: full code. Family Communication:  The plan of care was discussed in details with the patient (and family). I answered all questions. The patient agreed to proceed with the above mentioned plan. Further management will depend upon hospital course. Disposition Plan: Back to previous home environment Consults called: Cardiology and neurology. All the records are reviewed and case discussed with ED provider.  Status is: Observation  I certify that at the time of admission, it is my clinical judgment that the patient will require inpatient hospital care extending less than 2 midnights.                            Dispo: The patient is from: Home              Anticipated d/c is to: Home              Patient currently is not medically stable to d/c.              Difficult to place  patient: No  Fabiola Mudgett A Dareion Kneece M.D  on 01/04/2022 at 3:59 AM  [facility]   From 7 PM-7 AM, contact night-coverage [email or web address]  CC: Primary care physician; Leta Speller, MD

## 2022-01-04 NOTE — ED Notes (Signed)
Patient has been given all of her medications, saltines, and ginger ale. States that she is feeling "shaky". Advised pt that she should eat the crackers to help bring her blood glucose up. Pt verbalized understanding. Pt is going to stay a few more minutes until she is feeling better. Charge RN aware.

## 2022-01-04 NOTE — ED Provider Notes (Signed)
1:42 AM   Pt was seen yesterday for shortness of breath after missing 2 dialysis sessions.  She was found to have mild pulmonary edema.  She does wear 2 L of oxygen through a trach collar only at night when she is sleeping.  She did receive a full dialysis session yesterday evening and was awaiting her home medications prior to going home.  Nurse reported to me that patient acutely became more short of breath and was requiring 5 L nasal cannula.  She states she did have an episode of vomiting and there is concerned there could have been aspiration.  No fevers, cough, chest pain.  We will repeat labs, chest x-ray.  She was COVID and flu negative yesterday.    Patient's repeat labs show improvement of her metabolic acidosis.  Normal potassium.  Stable anemia.  Troponin mildly elevated at 43.  BNP 785.  Chest x-ray concerning for worsening pulmonary edema.  Discussed with Dr. Holley Raring with nephrology who will get patient set up for nonemergent dialysis again later today.  Will discuss with hospitalist for admission.  Patient doing well on 5 L trach collar without signs of respiratory distress.     Consulted and discussed patient's case with hospitalist, Dr. Sidney Ace.  I have recommended admission and consulting physician agrees and will place admission orders.  Patient (and family if present) agree with this plan.   I reviewed all nursing notes, vitals, pertinent previous records.  All labs, EKGs, imaging ordered have been independently reviewed and interpreted by myself.    Treson Laura, Delice Bison, DO 01/04/22 [phone]

## 2022-01-04 NOTE — ED Notes (Signed)
Troponin--43, called by lab. MD notified and aware

## 2022-01-04 NOTE — ED Notes (Signed)
IV team at the bedside. 

## 2022-01-04 NOTE — ED Notes (Signed)
Lab at the bedside 

## 2022-01-04 NOTE — ED Notes (Signed)
This RN came to DC pt. Pt son states her O2 keeps dropping and she is normally room air when awake. Pt is currently on 5Ltrach collar. When oxygen is removed pt drops into high 70's low 80's. Pt placed back on 5L trach collar

## 2022-01-04 NOTE — Progress Notes (Signed)
Interval events noted.  She feels better this morning.  She uses 3 L/min oxygen via trach collar at baseline.  She is currently on 5 L oxygen.  Other vital signs are stable.  Plan for hemodialysis today.

## 2022-01-04 NOTE — Progress Notes (Signed)
[facility]  ROUNDING NOTE   Subjective:   Deborah [name] is a 66 year old female with a past medical history including diabetes, hyperlipidemia, hypertension, CHF, glottic stenosis status post chronic tracheostomy, and end-stage renal disease on dialysis.  Patient presents to the emergency department with complaints of shortness of breath.   Patient resting quietly States she became short of breath overnight. Reports mild shortness of breath at this time Placed on 5 L via trach collar.  Objective:  Vital signs in last 24 hours:  Temp:  [98.2 F (36.8 C)-98.4 F (36.9 C)] 98.4 F (36.9 C) (01/31 2101) Pulse Rate:  [62-91] 62 (02/01 1413) Resp:  [13-21] 13 (02/01 1413) BP: (93-201)/(51-111) 120/72 (02/01 1300) SpO2:  [85 %-100 %] 97 % (02/01 1413) FiO2 (%):  [28 %] 28 % (01/31 2306) Weight:  [67.1 kg] 67.1 kg (01/31 1626)  Weight change: 3.1 kg Filed Weights   01/03/22 0026 01/03/22 1626  Weight: 64 kg 67.1 kg    Intake/Output: I/O last 3 completed shifts: In: -  Out: 504 [Other:504]   Intake/Output this shift:  No intake/output data recorded.  Physical Exam: General: NAD  Head: Normocephalic, atraumatic. Moist oral mucosal membranes  Eyes: Anicteric  Lungs:  Clear to auscultation, normal effort, midline trach  Heart: Regular rate and rhythm  Abdomen:  Soft, nontender  Extremities: No peripheral edema  Neurologic: Nonfocal, moving all four extremities  Skin: No lesions  Access: Left aVF    Basic Metabolic Panel: Recent Labs  Lab 01/03/22 0020 01/04/22 0236 01/04/22 0238  NA 136  --  133*  K 4.9  --  4.3  CL 99  --  93*  CO2 19*  --  29  GLUCOSE 128*  --  232*  BUN 169*  --  69*  CREATININE 16.04*  --  8.65*  CALCIUM 8.4*  --  8.3*  MG  --  1.5*  --      Liver Function Tests: Recent Labs  Lab 01/03/22 0020  AST 15  ALT 14  ALKPHOS 89  BILITOT 0.7  PROT 7.3  ALBUMIN 3.8    No results for input(s): LIPASE, AMYLASE in the last  168 hours. No results for input(s): AMMONIA in the last 168 hours.  CBC: Recent Labs  Lab 01/03/22 0020 01/04/22 0238  WBC 7.1 7.8  NEUTROABS 4.8 6.8  HGB 10.4* 10.0*  HCT 30.7* 29.3*  MCV 89.5 86.9  PLT 162 163     Cardiac Enzymes: No results for input(s): CKTOTAL, CKMB, CKMBINDEX, TROPONINI in the last 168 hours.  BNP: Invalid input(s): POCBNP  CBG: Recent Labs  Lab 01/03/22 0034 01/03/22 2316  GLUCAP 130* 117*     Microbiology: Results for orders placed or performed during the hospital encounter of 01/03/22  Resp Panel by RT-PCR (Flu A&B, Covid) Nasopharyngeal Swab     Status: None   Collection Time: 01/03/22 12:31 AM   Specimen: Nasopharyngeal Swab; Nasopharyngeal(NP) swabs in vial transport medium  Result Value Ref Range Status   SARS Coronavirus 2 by RT PCR NEGATIVE NEGATIVE Final    Comment: (NOTE) SARS-CoV-2 target nucleic acids are NOT DETECTED.  The SARS-CoV-2 RNA is generally detectable in upper respiratory specimens during the acute phase of infection. The lowest concentration of SARS-CoV-2 viral copies this assay can detect is 138 copies/mL. A negative result does not preclude SARS-Cov-2 infection and should not be used as the sole basis for treatment or other patient management decisions. A negative result may occur with  improper specimen collection/handling, submission of specimen other than nasopharyngeal swab, presence of viral mutation(s) within the areas targeted by this assay, and inadequate number of viral copies(<138 copies/mL). A negative result must be combined with clinical observations, patient history, and epidemiological information. The expected result is Negative.  Fact Sheet for Patients:  EntrepreneurPulse.com.au  Fact Sheet for Healthcare Providers:  IncredibleEmployment.be  This test is no t yet approved or cleared by the [place] FDA and  has been authorized for detection and/or  diagnosis of SARS-CoV-2 by FDA under an Emergency Use Authorization (EUA). This EUA will remain  in effect (meaning this test can be used) for the duration of the COVID-19 declaration under Section 564(b)(1) of the Act, 21 U.S.C.section 360bbb-3(b)(1), unless the authorization is terminated  or revoked sooner.       Influenza A by PCR NEGATIVE NEGATIVE Final   Influenza B by PCR NEGATIVE NEGATIVE Final    Comment: (NOTE) The Xpert Xpress SARS-CoV-2/FLU/RSV plus assay is intended as an aid in the diagnosis of influenza from Nasopharyngeal swab specimens and should not be used as a sole basis for treatment. Nasal washings and aspirates are unacceptable for Xpert Xpress SARS-CoV-2/FLU/RSV testing.  Fact Sheet for Patients: EntrepreneurPulse.com.au  Fact Sheet for Healthcare Providers: IncredibleEmployment.be  This test is not yet approved or cleared by the [place] FDA and has been authorized for detection and/or diagnosis of SARS-CoV-2 by FDA under an Emergency Use Authorization (EUA). This EUA will remain in effect (meaning this test can be used) for the duration of the COVID-19 declaration under Section 564(b)(1) of the Act, 21 U.S.C. section 360bbb-3(b)(1), unless the authorization is terminated or revoked.  Performed at [facility], [address] [place], [place] [address]     Coagulation Studies: No results for input(s): LABPROT, INR in the last 72 hours.  Urinalysis: No results for input(s): COLORURINE, LABSPEC, PHURINE, GLUCOSEU, HGBUR, BILIRUBINUR, KETONESUR, PROTEINUR, UROBILINOGEN, NITRITE, LEUKOCYTESUR in the last 72 hours.  Invalid input(s): APPERANCEUR    Imaging: DG Chest 2 View  Result Date: 01/03/2022 CLINICAL DATA:  Shortness of breath EXAM: CHEST - 2 VIEW COMPARISON:  11/10/2020 FINDINGS: Tracheostomy tube is again noted in satisfactory position. Postsurgical changes are again seen. Cardiac shadow is  enlarged. Mild central vascular congestion with is seen with minimal interstitial edema. No focal infiltrate or sizable effusion is noted. No bony abnormality is seen. IMPRESSION: Changes of mild CHF. This may be related to recently missed dialysis therapy. Electronically Signed   By: Inez Catalina M.D.   On: 01/03/2022 00:56   DG Chest Port 1 View  Result Date: 01/04/2022 CLINICAL DATA:  Increasing hypoxia EXAM: PORTABLE CHEST 1 VIEW COMPARISON:  01/03/22 FINDINGS: Cardiac shadow is enlarged but stable. Postsurgical changes are noted. Tracheostomy tube is noted in satisfactory position. Some increasing vascular congestion is noted without significant interstitial edema. No bony abnormality is noted. IMPRESSION: Worsening changes of CHF. Electronically Signed   By: Inez Catalina M.D.   On: 01/04/2022 02:00     Medications:    sodium chloride     sodium chloride      amLODipine  10 mg Oral Daily   apixaban  2.5 mg Oral BID   aspirin EC  81 mg Oral Daily   atorvastatin  80 mg Oral q1800   carvedilol  25 mg Oral BID   Chlorhexidine Gluconate Cloth  6 each Topical Q0600   escitalopram  10 mg Oral Daily   furosemide  60 mg Intravenous Q12H  gabapentin  300 mg Oral QHS   irbesartan  150 mg Oral Daily   midodrine  5 mg Oral Once per day on Mon Wed Fri   sevelamer carbonate  1,600 mg Oral TID WC   traZODone  50 mg Oral Once per day on Mon Wed Fri   sodium chloride, sodium chloride, acetaminophen **OR** acetaminophen, alteplase, heparin, lidocaine (PF), lidocaine-prilocaine, magnesium hydroxide, ondansetron **OR** ondansetron (ZOFRAN) IV, pentafluoroprop-tetrafluoroeth, traZODone  Assessment/ Plan:  Ms. DEAISA MERIDA is a 66 y.o.  female with a past medical history including diabetes, hyperlipidemia, hypertension, CHF, glottic stenosis status post chronic tracheostomy, and end-stage renal disease on dialysis.  Patient presents to the emergency department with complaints of shortness of breath.   Patient states she has missed the previous 2 dialysis treatments due to home health nurse inability to accompany patient to outpatient treatments.   [facility] [address]/MWF/Lt AVF/ 64kg  Respiratory distress with end-stage renal disease on dialysis.  Reports missing last 2 dialysis treatments.  Received dialysis yesterday, with UF 500 mL achieved.  Scheduled for dialysis today to maintain outpatient schedule.  2. Anemia of chronic kidney disease Lab Results  Component Value Date   HGB 10.0 (L) 01/04/2022  Hgb at goal, no need for ESA's at this time  3. Secondary Hyperparathyroidism:  Lab Results  Component Value Date   PTH 169 (H) 11/12/2020   CALCIUM 8.3 (L) 01/04/2022   CAION 1.09 (L) 09/01/2016   PHOS 4.5 11/12/2020    Calcium and phosphorus at goal.  We will continue to monitor   LOS: 0   2/1/20232:27 PM

## 2022-01-04 NOTE — Progress Notes (Signed)
Patient completes 2.5 hour treatment without incident, AVF cannulates with ease, maintains blood flow rate as prescribed. Targeted UF met with 1 liter fluid removal. Noted excessive bleeding post treatment exceeding one hour. Patient transferred to assigned room, report given.

## 2022-01-04 NOTE — ED Notes (Signed)
After checking patient's O2 sats on room air, she is staying between 80-85%. O2 placed back over trach and MD notified. Pt is now going to be staying instead of being discharged.

## 2022-01-05 DIAGNOSIS — J810 Acute pulmonary edema: Secondary | ICD-10-CM | POA: Diagnosis not present

## 2022-01-05 DIAGNOSIS — I5033 Acute on chronic diastolic (congestive) heart failure: Secondary | ICD-10-CM | POA: Diagnosis not present

## 2022-01-05 LAB — CBC
HCT: 29.3 % — ABNORMAL LOW (ref 36.0–46.0)
Hemoglobin: 9.6 g/dL — ABNORMAL LOW (ref 12.0–15.0)
MCH: 29.9 pg (ref 26.0–34.0)
MCHC: 32.8 g/dL (ref 30.0–36.0)
MCV: 91.3 fL (ref 80.0–100.0)
Platelets: 153 K/uL (ref 150–400)
RBC: 3.21 MIL/uL — ABNORMAL LOW (ref 3.87–5.11)
RDW: 16.2 % — ABNORMAL HIGH (ref 11.5–15.5)
WBC: 5.8 K/uL (ref 4.0–10.5)
nRBC: 0 % (ref 0.0–0.2)

## 2022-01-05 LAB — BASIC METABOLIC PANEL WITH GFR
Anion gap: 9 (ref 5–15)
BUN: 41 mg/dL — ABNORMAL HIGH (ref 8–23)
CO2: 29 mmol/L (ref 22–32)
Calcium: 8.4 mg/dL — ABNORMAL LOW (ref 8.9–10.3)
Chloride: 100 mmol/L (ref 98–111)
Creatinine, Ser: 6.95 mg/dL — ABNORMAL HIGH (ref 0.44–1.00)
GFR, Estimated: 6 mL/min — ABNORMAL LOW
Glucose, Bld: 123 mg/dL — ABNORMAL HIGH (ref 70–99)
Potassium: 4 mmol/L (ref 3.5–5.1)
Sodium: 138 mmol/L (ref 135–145)

## 2022-01-05 LAB — MAGNESIUM: Magnesium: 1.7 mg/dL (ref 1.7–2.4)

## 2022-01-05 NOTE — Plan of Care (Signed)

## 2022-01-05 NOTE — Progress Notes (Signed)
[facility]  ROUNDING NOTE   Subjective:   Deborah [name] is a 66 year old female with a past medical history including diabetes, hyperlipidemia, hypertension, CHF, glottic stenosis status post chronic tracheostomy, and end-stage renal disease on dialysis.  Patient presents to the emergency department with complaints of shortness of breath.   Patient appears to be resting comfortably this morning Reports mild shortness of breath and dizziness with activity Received dialysis yesterday, tolerated well No lower extremity edema  Objective:  Vital signs in last 24 hours:  Temp:  [98.3 F (36.8 C)-98.7 F (37.1 C)] 98.4 F (36.9 C) (02/02 0804) Pulse Rate:  [60-71] 66 (02/02 0804) Resp:  [11-19] 16 (02/02 0804) BP: (79-130)/(52-72) 115/65 (02/02 0804) SpO2:  [91 %-100 %] 100 % (02/02 0804) FiO2 (%):  [28 %] 28 % (02/01 2130) Weight:  [55.1 kg-63.3 kg] 63.3 kg (02/02 0500)  Weight change: -12 kg Filed Weights   01/03/22 1626 01/04/22 1445 01/05/22 0500  Weight: 67.1 kg 55.1 kg 63.3 kg    Intake/Output: I/O last 3 completed shifts: In: 360 [P.O.:360] Out: 1506 [Other:1506]   Intake/Output this shift:  No intake/output data recorded.  Physical Exam: General: NAD  Head: Normocephalic, atraumatic. Moist oral mucosal membranes  Eyes: Anicteric  Lungs:  Clear to auscultation, normal effort, midline trach  Heart: Regular rate and rhythm  Abdomen:  Soft, nontender  Extremities: No peripheral edema  Neurologic: Nonfocal, moving all four extremities  Skin: No lesions  Access: Left aVF    Basic Metabolic Panel: Recent Labs  Lab 01/03/22 0020 01/04/22 0236 01/04/22 0238 01/05/22 0515  NA 136  --  133* 138  K 4.9  --  4.3 4.0  CL 99  --  93* 100  CO2 19*  --  29 29  GLUCOSE 128*  --  232* 123*  BUN 169*  --  69* 41*  CREATININE 16.04*  --  8.65* 6.95*  CALCIUM 8.4*  --  8.3* 8.4*  MG  --  1.5*  --  1.7     Liver Function Tests: Recent Labs  Lab  01/03/22 0020  AST 15  ALT 14  ALKPHOS 89  BILITOT 0.7  PROT 7.3  ALBUMIN 3.8    No results for input(s): LIPASE, AMYLASE in the last 168 hours. No results for input(s): AMMONIA in the last 168 hours.  CBC: Recent Labs  Lab 01/03/22 0020 01/04/22 0238 01/05/22 0515  WBC 7.1 7.8 5.8  NEUTROABS 4.8 6.8  --   HGB 10.4* 10.0* 9.6*  HCT 30.7* 29.3* 29.3*  MCV 89.5 86.9 91.3  PLT 162 163 153     Cardiac Enzymes: No results for input(s): CKTOTAL, CKMB, CKMBINDEX, TROPONINI in the last 168 hours.  BNP: Invalid input(s): POCBNP  CBG: Recent Labs  Lab 01/03/22 0034 01/03/22 2316 01/04/22 2031  GLUCAP 130* 117* 99     Microbiology: Results for orders placed or performed during the hospital encounter of 01/03/22  Resp Panel by RT-PCR (Flu A&B, Covid) Nasopharyngeal Swab     Status: None   Collection Time: 01/03/22 12:31 AM   Specimen: Nasopharyngeal Swab; Nasopharyngeal(NP) swabs in vial transport medium  Result Value Ref Range Status   SARS Coronavirus 2 by RT PCR NEGATIVE NEGATIVE Final    Comment: (NOTE) SARS-CoV-2 target nucleic acids are NOT DETECTED.  The SARS-CoV-2 RNA is generally detectable in upper respiratory specimens during the acute phase of infection. The lowest concentration of SARS-CoV-2 viral copies this assay can detect is 138 copies/mL.  A negative result does not preclude SARS-Cov-2 infection and should not be used as the sole basis for treatment or other patient management decisions. A negative result may occur with  improper specimen collection/handling, submission of specimen other than nasopharyngeal swab, presence of viral mutation(s) within the areas targeted by this assay, and inadequate number of viral copies(<138 copies/mL). A negative result must be combined with clinical observations, patient history, and epidemiological information. The expected result is Negative.  Fact Sheet for Patients:   EntrepreneurPulse.com.au  Fact Sheet for Healthcare Providers:  IncredibleEmployment.be  This test is no t yet approved or cleared by the [place] FDA and  has been authorized for detection and/or diagnosis of SARS-CoV-2 by FDA under an Emergency Use Authorization (EUA). This EUA will remain  in effect (meaning this test can be used) for the duration of the COVID-19 declaration under Section 564(b)(1) of the Act, 21 U.S.C.section 360bbb-3(b)(1), unless the authorization is terminated  or revoked sooner.       Influenza A by PCR NEGATIVE NEGATIVE Final   Influenza B by PCR NEGATIVE NEGATIVE Final    Comment: (NOTE) The Xpert Xpress SARS-CoV-2/FLU/RSV plus assay is intended as an aid in the diagnosis of influenza from Nasopharyngeal swab specimens and should not be used as a sole basis for treatment. Nasal washings and aspirates are unacceptable for Xpert Xpress SARS-CoV-2/FLU/RSV testing.  Fact Sheet for Patients: EntrepreneurPulse.com.au  Fact Sheet for Healthcare Providers: IncredibleEmployment.be  This test is not yet approved or cleared by the [place] FDA and has been authorized for detection and/or diagnosis of SARS-CoV-2 by FDA under an Emergency Use Authorization (EUA). This EUA will remain in effect (meaning this test can be used) for the duration of the COVID-19 declaration under Section 564(b)(1) of the Act, 21 U.S.C. section 360bbb-3(b)(1), unless the authorization is terminated or revoked.  Performed at [facility], [address] [place], [place] [address]     Coagulation Studies: No results for input(s): LABPROT, INR in the last 72 hours.  Urinalysis: No results for input(s): COLORURINE, LABSPEC, PHURINE, GLUCOSEU, HGBUR, BILIRUBINUR, KETONESUR, PROTEINUR, UROBILINOGEN, NITRITE, LEUKOCYTESUR in the last 72 hours.  Invalid input(s): APPERANCEUR    Imaging: DG  Chest Port 1 View  Result Date: 01/04/2022 CLINICAL DATA:  Increasing hypoxia EXAM: PORTABLE CHEST 1 VIEW COMPARISON:  01/03/22 FINDINGS: Cardiac shadow is enlarged but stable. Postsurgical changes are noted. Tracheostomy tube is noted in satisfactory position. Some increasing vascular congestion is noted without significant interstitial edema. No bony abnormality is noted. IMPRESSION: Worsening changes of CHF. Electronically Signed   By: Inez Catalina M.D.   On: 01/04/2022 02:00   ECHOCARDIOGRAM COMPLETE  Result Date: 01/04/2022    ECHOCARDIOGRAM REPORT   Patient Name:   Deborah [name] Date of Exam: 01/04/2022 Medical Rec #:  [id number]     Height:       62.0 in Accession #:    [id number]    Weight:       147.9 lb Date of Birth:  [birth year]     BSA:          1.682 m Patient Age:    45 years      BP:           93/53 mmHg Patient Gender: F             HR:           66 bpm. Exam Location:  [facility] Procedure: 2D Echo, Color Doppler and Cardiac Doppler Indications:  I50.31 congestive heart failure-Acute Diastolic  History:         Patient has prior history of Echocardiogram examinations. ESRD;                  Risk Factors:Hypertension, Diabetes and Dyslipidemia.  Sonographer:     Charmayne Sheer Referring Phys:  [id number] JAN A MANSY Diagnosing Phys: Kate Sable MD  Sonographer Comments: Suboptimal subcostal window. IMPRESSIONS  1. Left ventricular ejection fraction, by estimation, is 55%. The left ventricle has normal function. The left ventricle has no regional wall motion abnormalities. Left ventricular diastolic parameters are consistent with Grade II diastolic dysfunction (pseudonormalization).  2. Right ventricular systolic function is normal. The right ventricular size is normal.  3. The mitral valve has been repaired/replaced. No evidence of mitral valve regurgitation. The mean mitral valve gradient is 6.0 mmHg. Echo findings are consistent with normal structure and function of the mitral valve prosthesis.  4. The  aortic valve is calcified. Aortic valve regurgitation is not visualized. Aortic valve sclerosis/calcification is present, without any evidence of aortic stenosis.  5. The inferior vena cava is normal in size with greater than 50% respiratory variability, suggesting right atrial pressure of 3 mmHg. FINDINGS  Left Ventricle: Left ventricular ejection fraction, by estimation, is 55%. The left ventricle has normal function. The left ventricle has no regional wall motion abnormalities. The left ventricular internal cavity size was normal in size. There is no left ventricular hypertrophy. Left ventricular diastolic parameters are consistent with Grade II diastolic dysfunction (pseudonormalization). Right Ventricle: The right ventricular size is normal. No increase in right ventricular wall thickness. Right ventricular systolic function is normal. Left Atrium: Left atrial size was normal in size. Right Atrium: Right atrial size was normal in size. Pericardium: There is no evidence of pericardial effusion. Mitral Valve: The mitral valve has been repaired/replaced. No evidence of mitral valve regurgitation. 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 Echo findings are consistent with normal structure and function of the mitral valve prosthesis. MV peak gradient, 12.4 mmHg. The mean mitral valve gradient is 6.0 mmHg. Tricuspid Valve: The tricuspid valve is normal in structure. Tricuspid valve regurgitation is trivial. Aortic Valve: The aortic valve is calcified. Aortic valve regurgitation is not visualized. Aortic valve sclerosis/calcification is present, without any evidence of aortic stenosis. Aortic valve mean gradient measures 6.0 mmHg. Aortic valve peak gradient measures 10.9 mmHg. Aortic valve area, by VTI measures 1.61 cm. Pulmonic Valve: The pulmonic valve was not well visualized. Pulmonic valve regurgitation is not visualized. Aorta: The aortic root is normal in size and structure. Venous: The  inferior vena cava is normal in size with greater than 50% respiratory variability, suggesting right atrial pressure of 3 mmHg. IAS/Shunts: No atrial level shunt detected by color flow Doppler.  LEFT VENTRICLE PLAX 2D LVIDd:         4.71 cm   Diastology LVIDs:         2.54 cm   LV e' medial:    4.79 cm/s LV PW:         1.03 cm   LV E/e' medial:  28.8 LV IVS:        0.96 cm   LV e' lateral:   3.81 cm/s LVOT diam:     2.10 cm   LV E/e' lateral: 36.2 LV SV:         54 LV SV Index:   32 LVOT Area:     3.46 cm  RIGHT VENTRICLE RV  Basal diam:  3.25 cm LEFT ATRIUM           Index        RIGHT ATRIUM           Index LA diam:      2.60 cm 1.55 cm/m   RA Area:     14.00 cm LA Vol (A4C): 36.2 ml 21.53 ml/m  RA Volume:   28.90 ml  17.18 ml/m  AORTIC VALVE                     PULMONIC VALVE AV Area (Vmax):    1.51 cm      PV Vmax:       0.77 m/s AV Area (Vmean):   1.52 cm      PV Vmean:      56.200 cm/s AV Area (VTI):     1.61 cm      PV VTI:        0.172 m AV Vmax:           165.00 cm/s   PV Peak grad:  2.3 mmHg AV Vmean:          115.000 cm/s  PV Mean grad:  1.0 mmHg AV VTI:            0.338 m AV Peak Grad:      10.9 mmHg AV Mean Grad:      6.0 mmHg LVOT Vmax:         72.00 cm/s LVOT Vmean:        50.500 cm/s LVOT VTI:          0.157 m LVOT/AV VTI ratio: 0.46  AORTA Ao Root diam: 3.00 cm MITRAL VALVE MV Area (PHT): 3.46 cm     SHUNTS MV Area VTI:   1.02 cm     Systemic VTI:  0.16 m MV Peak grad:  12.4 mmHg    Systemic Diam: 2.10 cm MV Mean grad:  6.0 mmHg MV Vmax:       1.76 m/s MV Vmean:      116.0 cm/s MV Decel Time: 219 msec MV E velocity: 138.00 cm/s MV A velocity: 115.00 cm/s MV E/A ratio:  1.20 Kate Sable MD Electronically signed by Kate Sable MD Signature Date/Time: 01/04/2022/3:22:41 PM    Final      Medications:    sodium chloride     sodium chloride      albuterol  2.5 mg Nebulization TID   amLODipine  10 mg Oral Daily   apixaban  2.5 mg Oral BID   aspirin EC  81 mg Oral Daily    atorvastatin  80 mg Oral q1800   carvedilol  25 mg Oral BID   Chlorhexidine Gluconate Cloth  6 each Topical Q0600   escitalopram  10 mg Oral Daily   furosemide  60 mg Intravenous Q12H   gabapentin  300 mg Oral QHS   irbesartan  150 mg Oral Daily   midodrine  5 mg Oral Once per day on Mon Wed Fri   sevelamer carbonate  1,600 mg Oral TID WC   traZODone  50 mg Oral Once per day on Mon Wed Fri   sodium chloride, sodium chloride, acetaminophen **OR** acetaminophen, alteplase, heparin, lidocaine (PF), lidocaine-prilocaine, magnesium hydroxide, ondansetron **OR** ondansetron (ZOFRAN) IV, pentafluoroprop-tetrafluoroeth, traZODone  Assessment/ Plan:  Ms. LATEASHA BREUER is a 65 y.o.  female with a past medical history including diabetes, hyperlipidemia, hypertension, CHF, glottic stenosis status post chronic tracheostomy,  and end-stage renal disease on dialysis.  Patient presents to the emergency department with complaints of shortness of breath.  Patient states she has missed the previous 2 dialysis treatments due to home health nurse inability to accompany patient to outpatient treatments.   [facility] [address]/MWF/Lt AVF/ 64kg  Respiratory distress with end-stage renal disease on dialysis.    Received dialysis yesterday, UF 1 L removed.  Next treatment scheduled for Friday.  Dialysis coordinator aware of patient and available for any outpatient needs.  Patient will need reliable tracheostomy trying to escort to accompany her to her outpatient dialysis treatments prior to discharge.    2. Anemia of chronic kidney disease Lab Results  Component Value Date   HGB 9.6 (L) 01/05/2022  We will continue to monitor hemoglobin during this admission and treat as needed  3. Secondary Hyperparathyroidism:  Lab Results  Component Value Date   PTH 169 (H) 11/12/2020   CALCIUM 8.4 (L) 01/05/2022   CAION 1.09 (L) 09/01/2016   PHOS 4.5 11/12/2020    We will continue to monitor bone mineral metabolism during  this admission   LOS: [place] 2/2/20239:41 AM

## 2022-01-05 NOTE — TOC Progression Note (Addendum)
Transition of Care [facility]) - Progression Note    Patient Details  Name: Deborah [name] MRN: [id number] Date of Birth: 07/12/1956  [facility] CM/SW [place], RN Phone Number: 01/05/2022, 3:24 PM  Clinical Narrative:    Patient in need of nursing care to accompany her to HD treatments to manage trach care. Patient was previously receiving these services through [facility] however due to staffing issues [name] is unable to continue.   Spoke to [place] @ [facility] who reports possibly being able to assist. Requested facesheet and progress notes be faxed to [facility] @ [phone]. Faxed requested information.   LVMM for [facility] -HD coordinator requesting confirmation of what discipline needs to accompany patient.         Expected Discharge Plan and Services                                                 Social Determinants of Health (SDOH) Interventions    Readmission Risk Interventions Readmission Risk Prevention Plan 11/04/2020 04/02/2020 10/18/2019  Transportation Screening Complete Complete Complete  Medication Review Press photographer) (No Data) Complete -  PCP or Specialist appointment within 3-5 days of discharge Complete Complete Complete  HRI or Home Care Consult (No Data) Complete -  SW Recovery Care/Counseling Consult Complete - -  Palliative Care Screening Not Applicable - -  [place] Not Applicable Complete Not Applicable  Some recent data might be hidden

## 2022-01-05 NOTE — Progress Notes (Addendum)
Progress Note    Deborah [name]  IDP:[id number] DOB: 08-13-1956  DOA: 01/03/2022 PCP: Leta Speller, MD      Brief Narrative:    Medical records reviewed and are as summarized below:  Deborah [name] is a 66 y.o. female with medical history significant for atrial fibrillation on Eliquis, type II DM, ESRD on hemodialysis on M, W and F, hypertension, dyslipidemia, glottic stenosis s/p tracheostomy, who presented to the hospital because of wheezing, worsening shortness of breath and productive cough after missing 2 outpatient hemodialysis sessions.  Patient has to be accompanied by a nurse to the dialysis unit but apparently the nurses have been calling out and so she had not been able to go to dialysis.  She was diagnosed with acute pulmonary edema/volume overload/acute on chronic diastolic CHF from missed hemodialysis.  She was emergently dialyzed in the emergency room with plan for discharge back home.  However, she became acutely short of breath with increasing oxygen requirement up to 5 L while waiting to be discharged from the emergency room.  She was subsequently admitted to the hospital for further management.         Assessment/Plan:   Principal Problem:   Acute pulmonary edema ([name]) Active Problems:   Acute on chronic diastolic CHF (congestive heart failure) ([name])   Body mass index is 25.52 kg/m.   Acute pulmonary edema/volume overload/acute on chronic diastolic CHF: Fluid management with hemodialysis.  ESRD: Plan for hemodialysis tomorrow.  Follow-up with nephrologist.  She needs a nurse who can handle trach care to accompany her to hemodialysis in the outpatient setting.  Plan discussed with Dr. Holley Raring, nephrologist.  Notified Deborah [name], case manager, to assist in this regard.  Acute on chronic hypoxic respiratory failure: She uses 2-3 L/min oxygen at home.  She remains on 5 L/min oxygen.  Taper down oxygen as able.  Paroxysmal atrial fibrillation: Continue  carvedilol and Eliquis  Other comorbidities include depression, type II DM with peripheral neuropathy, hypertension, dyslipidemia, GERD  Diet Order             Diet Heart Room service appropriate? Yes; Fluid consistency: Thin  Diet effective now                        Assessment and Plan: No notes have been filed under this hospital service. Service: Hospitalist           Consultants: Nephrologist  Procedures: None    Medications:    albuterol  2.5 mg Nebulization TID   amLODipine  10 mg Oral Daily   apixaban  2.5 mg Oral BID   aspirin EC  81 mg Oral Daily   atorvastatin  80 mg Oral q1800   carvedilol  25 mg Oral BID   Chlorhexidine Gluconate Cloth  6 each Topical Q0600   escitalopram  10 mg Oral Daily   furosemide  60 mg Intravenous Q12H   gabapentin  300 mg Oral QHS   irbesartan  150 mg Oral Daily   midodrine  5 mg Oral Once per day on Mon Wed Fri   sevelamer carbonate  1,600 mg Oral TID WC   traZODone  50 mg Oral Once per day on Mon Wed Fri   Continuous Infusions:  sodium chloride     sodium chloride       Anti-infectives (From admission, onward)    None              Family  Communication/Anticipated D/C date and plan/Code Status   DVT prophylaxis: apixaban (ELIQUIS) tablet 2.5 mg Start: 01/03/22 2200 apixaban (ELIQUIS) tablet 2.5 mg     Code Status: Full Code  Family Communication: None Disposition Plan: Plan to discharge home tomorrow   Status is: Inpatient Remains inpatient appropriate because: Plan for hemodialysis tomorrow before discharge               Subjective:   Interval events noted.  Breathing is a little better today.  No chest pain.  Objective:    Vitals:   01/05/22 0450 01/05/22 0500 01/05/22 0804 01/05/22 1422  BP: (!) 100/58  115/65   Pulse: 60  66   Resp: 16  16   Temp: 98.3 F (36.8 C)  98.4 F (36.9 C)   TempSrc:      SpO2: 91%  100% 98%  Weight:  63.3 kg    Height:        No data found.   Intake/Output Summary (Last 24 hours) at 01/05/2022 1429 Last data filed at 01/05/2022 1415 Gross per 24 hour  Intake 360 ml  Output 1002 ml  Net -642 ml   Filed Weights   01/03/22 1626 01/04/22 1445 01/05/22 0500  Weight: 67.1 kg 55.1 kg 63.3 kg    Exam:  GEN: NAD SKIN: No rash EYES: EOMI ENT: MMM, +trach CV: RRR PULM: CTA B ABD: soft, ND, NT, +BS CNS: AAO x 3, non focal EXT: No edema or tenderness        Data Reviewed:   I have personally reviewed following labs and imaging studies:  Labs: Labs show the following:   Basic Metabolic Panel: Recent Labs  Lab 01/03/22 0020 01/04/22 0236 01/04/22 0238 01/05/22 0515  NA 136  --  133* 138  K 4.9  --  4.3 4.0  CL 99  --  93* 100  CO2 19*  --  29 29  GLUCOSE 128*  --  232* 123*  BUN 169*  --  69* 41*  CREATININE 16.04*  --  8.65* 6.95*  CALCIUM 8.4*  --  8.3* 8.4*  MG  --  1.5*  --  1.7   GFR Estimated Creatinine Clearance: 7.2 mL/min (A) (by C-G formula based on SCr of 6.95 mg/dL (H)). Liver Function Tests: Recent Labs  Lab 01/03/22 0020  AST 15  ALT 14  ALKPHOS 89  BILITOT 0.7  PROT 7.3  ALBUMIN 3.8   No results for input(s): LIPASE, AMYLASE in the last 168 hours. No results for input(s): AMMONIA in the last 168 hours. Coagulation profile No results for input(s): INR, PROTIME in the last 168 hours.  CBC: Recent Labs  Lab 01/03/22 0020 01/04/22 0238 01/05/22 0515  WBC 7.1 7.8 5.8  NEUTROABS 4.8 6.8  --   HGB 10.4* 10.0* 9.6*  HCT 30.7* 29.3* 29.3*  MCV 89.5 86.9 91.3  PLT 162 163 153   Cardiac Enzymes: No results for input(s): CKTOTAL, CKMB, CKMBINDEX, TROPONINI in the last 168 hours. BNP (last 3 results) No results for input(s): PROBNP in the last 8760 hours. CBG: Recent Labs  Lab 01/03/22 0034 01/03/22 2316 01/04/22 2031  GLUCAP 130* 117* 99   D-Dimer: No results for input(s): DDIMER in the last 72 hours. Hgb A1c: No results for input(s): HGBA1C in the  last 72 hours. Lipid Profile: No results for input(s): CHOL, HDL, LDLCALC, TRIG, CHOLHDL, LDLDIRECT in the last 72 hours. Thyroid function studies: No results for input(s): TSH, T4TOTAL, T3FREE, THYROIDAB in the  last 72 hours.  Invalid input(s): FREET3 Anemia work up: No results for input(s): VITAMINB12, FOLATE, FERRITIN, TIBC, IRON, RETICCTPCT in the last 72 hours. Sepsis Labs: Recent Labs  Lab 01/03/22 0020 01/04/22 0238 01/05/22 0515  WBC 7.1 7.8 5.8    Microbiology Recent Results (from the past 240 hour(s))  Resp Panel by RT-PCR (Flu A&B, Covid) Nasopharyngeal Swab     Status: None   Collection Time: 01/03/22 12:31 AM   Specimen: Nasopharyngeal Swab; Nasopharyngeal(NP) swabs in vial transport medium  Result Value Ref Range Status   SARS Coronavirus 2 by RT PCR NEGATIVE NEGATIVE Final    Comment: (NOTE) SARS-CoV-2 target nucleic acids are NOT DETECTED.  The SARS-CoV-2 RNA is generally detectable in upper respiratory specimens during the acute phase of infection. The lowest concentration of SARS-CoV-2 viral copies this assay can detect is 138 copies/mL. A negative result does not preclude SARS-Cov-2 infection and should not be used as the sole basis for treatment or other patient management decisions. A negative result may occur with  improper specimen collection/handling, submission of specimen other than nasopharyngeal swab, presence of viral mutation(s) within the areas targeted by this assay, and inadequate number of viral copies(<138 copies/mL). A negative result must be combined with clinical observations, patient history, and epidemiological information. The expected result is Negative.  Fact Sheet for Patients:  EntrepreneurPulse.com.au  Fact Sheet for Healthcare Providers:  IncredibleEmployment.be  This test is no t yet approved or cleared by the [place] FDA and  has been authorized for detection and/or diagnosis of  SARS-CoV-2 by FDA under an Emergency Use Authorization (EUA). This EUA will remain  in effect (meaning this test can be used) for the duration of the COVID-19 declaration under Section 564(b)(1) of the Act, 21 U.S.C.section 360bbb-3(b)(1), unless the authorization is terminated  or revoked sooner.       Influenza A by PCR NEGATIVE NEGATIVE Final   Influenza B by PCR NEGATIVE NEGATIVE Final    Comment: (NOTE) The Xpert Xpress SARS-CoV-2/FLU/RSV plus assay is intended as an aid in the diagnosis of influenza from Nasopharyngeal swab specimens and should not be used as a sole basis for treatment. Nasal washings and aspirates are unacceptable for Xpert Xpress SARS-CoV-2/FLU/RSV testing.  Fact Sheet for Patients: EntrepreneurPulse.com.au  Fact Sheet for Healthcare Providers: IncredibleEmployment.be  This test is not yet approved or cleared by the [place] FDA and has been authorized for detection and/or diagnosis of SARS-CoV-2 by FDA under an Emergency Use Authorization (EUA). This EUA will remain in effect (meaning this test can be used) for the duration of the COVID-19 declaration under Section 564(b)(1) of the Act, 21 U.S.C. section 360bbb-3(b)(1), unless the authorization is terminated or revoked.  Performed at [facility], [address] [place], [place] [address]     Procedures and diagnostic studies:  DG Chest [facility] 1 View  Result Date: 01/04/2022 CLINICAL DATA:  Increasing hypoxia EXAM: PORTABLE CHEST 1 VIEW COMPARISON:  01/03/22 FINDINGS: Cardiac shadow is enlarged but stable. Postsurgical changes are noted. Tracheostomy tube is noted in satisfactory position. Some increasing vascular congestion is noted without significant interstitial edema. No bony abnormality is noted. IMPRESSION: Worsening changes of CHF. Electronically Signed   By: Inez Catalina M.D.   On: 01/04/2022 02:00   ECHOCARDIOGRAM COMPLETE  Result Date:  01/04/2022    ECHOCARDIOGRAM REPORT   Patient Name:   NYONNA HARGROVE Date of Exam: 01/04/2022 Medical Rec #:  [id number]     Height:  62.0 in Accession #:    [id number]    Weight:       147.9 lb Date of Birth:  14-Feb-1956     BSA:          1.682 m Patient Age:    70 years      BP:           93/53 mmHg Patient Gender: F             HR:           66 bpm. Exam Location:  [facility] Procedure: 2D Echo, Color Doppler and Cardiac Doppler Indications:     I50.31 congestive heart failure-Acute Diastolic  History:         Patient has prior history of Echocardiogram examinations. ESRD;                  Risk Factors:Hypertension, Diabetes and Dyslipidemia.  Sonographer:     Charmayne Sheer Referring Phys:  [id number] JAN A MANSY Diagnosing Phys: Kate Sable MD  Sonographer Comments: Suboptimal subcostal window. IMPRESSIONS  1. Left ventricular ejection fraction, by estimation, is 55%. The left ventricle has normal function. The left ventricle has no regional wall motion abnormalities. Left ventricular diastolic parameters are consistent with Grade II diastolic dysfunction (pseudonormalization).  2. Right ventricular systolic function is normal. The right ventricular size is normal.  3. The mitral valve has been repaired/replaced. No evidence of mitral valve regurgitation. The mean mitral valve gradient is 6.0 mmHg. Echo findings are consistent with normal structure and function of the mitral valve prosthesis.  4. The aortic valve is calcified. Aortic valve regurgitation is not visualized. Aortic valve sclerosis/calcification is present, without any evidence of aortic stenosis.  5. The inferior vena cava is normal in size with greater than 50% respiratory variability, suggesting right atrial pressure of 3 mmHg. FINDINGS  Left Ventricle: Left ventricular ejection fraction, by estimation, is 55%. The left ventricle has normal function. The left ventricle has no regional wall motion abnormalities. The left ventricular internal cavity size  was normal in size. There is no left ventricular hypertrophy. Left ventricular diastolic parameters are consistent with Grade II diastolic dysfunction (pseudonormalization). Right Ventricle: The right ventricular size is normal. No increase in right ventricular wall thickness. Right ventricular systolic function is normal. Left Atrium: Left atrial size was normal in size. Right Atrium: Right atrial size was normal in size. Pericardium: There is no evidence of pericardial effusion. Mitral Valve: The mitral valve has been repaired/replaced. No evidence of mitral valve regurgitation. 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 Echo findings are consistent with normal structure and function of the mitral valve prosthesis. MV peak gradient, 12.4 mmHg. The mean mitral valve gradient is 6.0 mmHg. Tricuspid Valve: The tricuspid valve is normal in structure. Tricuspid valve regurgitation is trivial. Aortic Valve: The aortic valve is calcified. Aortic valve regurgitation is not visualized. Aortic valve sclerosis/calcification is present, without any evidence of aortic stenosis. Aortic valve mean gradient measures 6.0 mmHg. Aortic valve peak gradient measures 10.9 mmHg. Aortic valve area, by VTI measures 1.61 cm. Pulmonic Valve: The pulmonic valve was not well visualized. Pulmonic valve regurgitation is not visualized. Aorta: The aortic root is normal in size and structure. Venous: The inferior vena cava is normal in size with greater than 50% respiratory variability, suggesting right atrial pressure of 3 mmHg. IAS/Shunts: No atrial level shunt detected by color flow Doppler.  LEFT VENTRICLE PLAX 2D LVIDd:  4.71 cm   Diastology LVIDs:         2.54 cm   LV e' medial:    4.79 cm/s LV PW:         1.03 cm   LV E/e' medial:  28.8 LV IVS:        0.96 cm   LV e' lateral:   3.81 cm/s LVOT diam:     2.10 cm   LV E/e' lateral: 36.2 LV SV:         54 LV SV Index:   32 LVOT Area:     3.46 cm  RIGHT VENTRICLE RV  Basal diam:  3.25 cm LEFT ATRIUM           Index        RIGHT ATRIUM           Index LA diam:      2.60 cm 1.55 cm/m   RA Area:     14.00 cm LA Vol (A4C): 36.2 ml 21.53 ml/m  RA Volume:   28.90 ml  17.18 ml/m  AORTIC VALVE                     PULMONIC VALVE AV Area (Vmax):    1.51 cm      PV Vmax:       0.77 m/s AV Area (Vmean):   1.52 cm      PV Vmean:      56.200 cm/s AV Area (VTI):     1.61 cm      PV VTI:        0.172 m AV Vmax:           165.00 cm/s   PV Peak grad:  2.3 mmHg AV Vmean:          115.000 cm/s  PV Mean grad:  1.0 mmHg AV VTI:            0.338 m AV Peak Grad:      10.9 mmHg AV Mean Grad:      6.0 mmHg LVOT Vmax:         72.00 cm/s LVOT Vmean:        50.500 cm/s LVOT VTI:          0.157 m LVOT/AV VTI ratio: 0.46  AORTA Ao Root diam: 3.00 cm MITRAL VALVE MV Area (PHT): 3.46 cm     SHUNTS MV Area VTI:   1.02 cm     Systemic VTI:  0.16 m MV Peak grad:  12.4 mmHg    Systemic Diam: 2.10 cm MV Mean grad:  6.0 mmHg MV Vmax:       1.76 m/s MV Vmean:      116.0 cm/s MV Decel Time: 219 msec MV E velocity: 138.00 cm/s MV A velocity: 115.00 cm/s MV E/A ratio:  1.20 Kate Sable MD Electronically signed by Kate Sable MD Signature Date/Time: 01/04/2022/3:22:41 PM    Final                LOS: 1 day   Klea Nall  [facility]   Pager on [email or web address]. If 7PM-7AM, please contact night-coverage at [email or web address]     01/05/2022, 2:29 PM

## 2022-01-06 DIAGNOSIS — J810 Acute pulmonary edema: Secondary | ICD-10-CM | POA: Diagnosis not present

## 2022-01-06 DIAGNOSIS — N186 End stage renal disease: Secondary | ICD-10-CM | POA: Diagnosis not present

## 2022-01-06 DIAGNOSIS — I5033 Acute on chronic diastolic (congestive) heart failure: Secondary | ICD-10-CM | POA: Diagnosis not present

## 2022-01-06 LAB — GLUCOSE, CAPILLARY: Glucose-Capillary: 223 mg/dL — ABNORMAL HIGH (ref 70–99)

## 2022-01-06 MED ORDER — FUROSEMIDE 40 MG PO TABS
40.0000 mg | ORAL_TABLET | ORAL | Status: DC
  Administered 2022-01-07 – 2022-01-17 (×7): 40 mg via ORAL
  Filled 2022-01-06 (×6): qty 1

## 2022-01-06 NOTE — TOC Progression Note (Signed)
Transition of Care [facility]) - Progression Note    Patient Details  Name: ALSACE DOWD MRN: [id number] Date of Birth: November 01, 1956  Transition of Care [facility]) CM/SW [place], LCSW Phone Number: 01/06/2022, 3:21 PM  Clinical Narrative:   CSW spoke with [place] at [facility] [phone]) and she states they did receive the fax yesterday however the next step is for her to get with the clinical manager to see if they have the staffing to help. [place] states she will get in touch with Melvin on Tuesday or Wednesday of next week.          Expected Discharge Plan and Services                                                 Social Determinants of Health (SDOH) Interventions    Readmission Risk Interventions Readmission Risk Prevention Plan 11/04/2020 04/02/2020 10/18/2019  Transportation Screening Complete Complete Complete  Medication Review Press photographer) (No Data) Complete -  PCP or Specialist appointment within 3-5 days of discharge Complete Complete Complete  HRI or Home Care Consult (No Data) Complete -  SW Recovery Care/Counseling Consult Complete - -  Palliative Care Screening Not Applicable - -  [place] Not Applicable Complete Not Applicable  Some recent data might be hidden

## 2022-01-06 NOTE — Progress Notes (Signed)
Patient completes scheduled treatment without incident, AVG functions well, maintains BFR. Targeted UF met  with 1-liter fluid removal, standing post wt. 63.5 kg. Labs drawn per order, no medications given,. Patient did have excessive bleeding from arterial site exceeding 10 minutes. Report given to primary nurse, patient returned to assigned room.

## 2022-01-06 NOTE — Progress Notes (Addendum)
Progress Note    Deborah [name]  JKK:[id number] DOB: 06/26/1956  DOA: 01/03/2022 PCP: Leta Speller, MD      Brief Narrative:    Medical records reviewed and are as summarized below:  Deborah [name] is a 66 y.o. female with medical history significant for atrial fibrillation on Eliquis, type II DM, ESRD on hemodialysis on M, W and F, hypertension, dyslipidemia, glottic stenosis s/p tracheostomy, who presented to the hospital because of wheezing, worsening shortness of breath and productive cough after missing 2 outpatient hemodialysis sessions.  Patient has to be accompanied by a nurse to the dialysis unit but apparently the nurses have been calling out and so she had not been able to go to dialysis.  She was diagnosed with acute pulmonary edema/volume overload/acute on chronic diastolic CHF from missed hemodialysis.  She was emergently dialyzed in the emergency room with plan for discharge back home.  However, she became acutely short of breath with increasing oxygen requirement up to 5 L while waiting to be discharged from the emergency room.  She was subsequently admitted to the hospital for further management.         Assessment/Plan:   Principal Problem:   Acute pulmonary edema ([name]) Active Problems:   ESRD (end stage renal disease) ([name])   Acute on chronic diastolic CHF (congestive heart failure) ([name])   Body mass index is 25.6 kg/m.   Acute pulmonary edema/volume overload/acute on chronic diastolic CHF: Switch IV to oral Lasix.  Fluid management with hemodialysis.  ESRD: She had hemodialysis today.  She needs a nurse who can handle trach care to accompany her to hemodialysis in the outpatient setting.  This has to be arranged before patient can be safely discharged home.  Discussed with Shelton Silvas, Education officer, museum, who is working on the case.  Acute on chronic hypoxic respiratory failure: Oxygen has been weaned down to 3 L/min which is about her  baseline.  Paroxysmal atrial fibrillation: Continue carvedilol and Eliquis  Other comorbidities include depression, type II DM with peripheral neuropathy, hypertension, dyslipidemia, GERD  Diet Order             Diet Heart Room service appropriate? Yes; Fluid consistency: Thin  Diet effective now                       Consultants: Nephrologist  Procedures: None    Medications:    albuterol  2.5 mg Nebulization TID   amLODipine  10 mg Oral Daily   apixaban  2.5 mg Oral BID   aspirin EC  81 mg Oral Daily   atorvastatin  80 mg Oral q1800   carvedilol  25 mg Oral BID   Chlorhexidine Gluconate Cloth  6 each Topical Q0600   escitalopram  10 mg Oral Daily   [START ON 01/07/2022] furosemide  40 mg Oral Once per day on Sun Tue Thu Sat   gabapentin  300 mg Oral QHS   irbesartan  150 mg Oral Daily   midodrine  5 mg Oral Once per day on Mon Wed Fri   sevelamer carbonate  1,600 mg Oral TID WC   traZODone  50 mg Oral Once per day on Mon Wed Fri   Continuous Infusions:     Anti-infectives (From admission, onward)    None              Family Communication/Anticipated D/C date and plan/Code Status   DVT prophylaxis: apixaban (ELIQUIS) tablet 2.5 mg  Start: 01/03/22 2200 apixaban (ELIQUIS) tablet 2.5 mg     Code Status: Full Code  Family Communication: None Disposition Plan: Plan to discharge home when safe to do so.   Status is: Inpatient Remains inpatient appropriate because: Unsafe discharge              Subjective:   Interval events noted.  She just got back from dialysis.  No shortness of breath or chest pain.  Objective:    Vitals:   01/06/22 1300 01/06/22 1315 01/06/22 1327 01/06/22 1355  BP: (!) 124/58 129/60  133/80  Pulse: 77 76  73  Resp: 14 (!) 23  14  Temp:    98.5 F (36.9 C)  TempSrc:    Oral  SpO2: (!) 89% 92%  100%  Weight:   63.5 kg   Height:       No data found.   Intake/Output Summary (Last 24 hours) at  01/06/2022 1552 Last data filed at 01/06/2022 1232 Gross per 24 hour  Intake 240 ml  Output 1006 ml  Net -766 ml   Filed Weights   01/06/22 0925 01/06/22 1232 01/06/22 1327  Weight: 67 kg 63.5 kg 63.5 kg    Exam:  GEN: NAD SKIN: No rash EYES: EOMI ENT: MMM, +trach CV: RRR PULM: CTA B ABD: soft, ND, NT, +BS CNS: AAO x 3, non focal EXT: No edema or tenderness        Data Reviewed:   I have personally reviewed following labs and imaging studies:  Labs: Labs show the following:   Basic Metabolic Panel: Recent Labs  Lab 01/03/22 0020 01/04/22 0236 01/04/22 0238 01/05/22 0515  NA 136  --  133* 138  K 4.9  --  4.3 4.0  CL 99  --  93* 100  CO2 19*  --  29 29  GLUCOSE 128*  --  232* 123*  BUN 169*  --  69* 41*  CREATININE 16.04*  --  8.65* 6.95*  CALCIUM 8.4*  --  8.3* 8.4*  MG  --  1.5*  --  1.7   GFR Estimated Creatinine Clearance: 7.2 mL/min (A) (by C-G formula based on SCr of 6.95 mg/dL (H)). Liver Function Tests: Recent Labs  Lab 01/03/22 0020  AST 15  ALT 14  ALKPHOS 89  BILITOT 0.7  PROT 7.3  ALBUMIN 3.8   No results for input(s): LIPASE, AMYLASE in the last 168 hours. No results for input(s): AMMONIA in the last 168 hours. Coagulation profile No results for input(s): INR, PROTIME in the last 168 hours.  CBC: Recent Labs  Lab 01/03/22 0020 01/04/22 0238 01/05/22 0515  WBC 7.1 7.8 5.8  NEUTROABS 4.8 6.8  --   HGB 10.4* 10.0* 9.6*  HCT 30.7* 29.3* 29.3*  MCV 89.5 86.9 91.3  PLT 162 163 153   Cardiac Enzymes: No results for input(s): CKTOTAL, CKMB, CKMBINDEX, TROPONINI in the last 168 hours. BNP (last 3 results) No results for input(s): PROBNP in the last 8760 hours. CBG: Recent Labs  Lab 01/03/22 0034 01/03/22 2316 01/04/22 2031  GLUCAP 130* 117* 99   D-Dimer: No results for input(s): DDIMER in the last 72 hours. Hgb A1c: No results for input(s): HGBA1C in the last 72 hours. Lipid Profile: No results for input(s): CHOL, HDL,  LDLCALC, TRIG, CHOLHDL, LDLDIRECT in the last 72 hours. Thyroid function studies: No results for input(s): TSH, T4TOTAL, T3FREE, THYROIDAB in the last 72 hours.  Invalid input(s): FREET3 Anemia work up: No results for  input(s): VITAMINB12, FOLATE, FERRITIN, TIBC, IRON, RETICCTPCT in the last 72 hours. Sepsis Labs: Recent Labs  Lab 01/03/22 0020 01/04/22 0238 01/05/22 0515  WBC 7.1 7.8 5.8    Microbiology Recent Results (from the past 240 hour(s))  Resp Panel by RT-PCR (Flu A&B, Covid) Nasopharyngeal Swab     Status: None   Collection Time: 01/03/22 12:31 AM   Specimen: Nasopharyngeal Swab; Nasopharyngeal(NP) swabs in vial transport medium  Result Value Ref Range Status   SARS Coronavirus 2 by RT PCR NEGATIVE NEGATIVE Final    Comment: (NOTE) SARS-CoV-2 target nucleic acids are NOT DETECTED.  The SARS-CoV-2 RNA is generally detectable in upper respiratory specimens during the acute phase of infection. The lowest concentration of SARS-CoV-2 viral copies this assay can detect is 138 copies/mL. A negative result does not preclude SARS-Cov-2 infection and should not be used as the sole basis for treatment or other patient management decisions. A negative result may occur with  improper specimen collection/handling, submission of specimen other than nasopharyngeal swab, presence of viral mutation(s) within the areas targeted by this assay, and inadequate number of viral copies(<138 copies/mL). A negative result must be combined with clinical observations, patient history, and epidemiological information. The expected result is Negative.  Fact Sheet for Patients:  EntrepreneurPulse.com.au  Fact Sheet for Healthcare Providers:  IncredibleEmployment.be  This test is no t yet approved or cleared by the [place] FDA and  has been authorized for detection and/or diagnosis of SARS-CoV-2 by FDA under an Emergency Use Authorization (EUA). This  EUA will remain  in effect (meaning this test can be used) for the duration of the COVID-19 declaration under Section 564(b)(1) of the Act, 21 U.S.C.section 360bbb-3(b)(1), unless the authorization is terminated  or revoked sooner.       Influenza A by PCR NEGATIVE NEGATIVE Final   Influenza B by PCR NEGATIVE NEGATIVE Final    Comment: (NOTE) The Xpert Xpress SARS-CoV-2/FLU/RSV plus assay is intended as an aid in the diagnosis of influenza from Nasopharyngeal swab specimens and should not be used as a sole basis for treatment. Nasal washings and aspirates are unacceptable for Xpert Xpress SARS-CoV-2/FLU/RSV testing.  Fact Sheet for Patients: EntrepreneurPulse.com.au  Fact Sheet for Healthcare Providers: IncredibleEmployment.be  This test is not yet approved or cleared by the [place] FDA and has been authorized for detection and/or diagnosis of SARS-CoV-2 by FDA under an Emergency Use Authorization (EUA). This EUA will remain in effect (meaning this test can be used) for the duration of the COVID-19 declaration under Section 564(b)(1) of the Act, 21 U.S.C. section 360bbb-3(b)(1), unless the authorization is terminated or revoked.  Performed at [facility], [address] [place], [place] [address]     Procedures and diagnostic studies:  No results found.             LOS: 2 days   Alexey Rhoads  [facility]   Pager on [email or web address]. If 7PM-7AM, please contact night-coverage at [email or web address]     01/06/2022, 3:52 PM

## 2022-01-06 NOTE — Progress Notes (Signed)
[facility]  ROUNDING NOTE   Subjective:   Deborah [name] is a 66 year old female with a past medical history including diabetes, hyperlipidemia, hypertension, CHF, glottic stenosis status post chronic tracheostomy, and end-stage renal disease on dialysis.  Patient presents to the emergency department with complaints of shortness of breath.   Patient seen and evaluated during dialysis   HEMODIALYSIS FLOWSHEET:  Blood Flow Rate (mL/min): 400 mL/min Arterial Pressure (mmHg): -120 mmHg Venous Pressure (mmHg): 180 mmHg Transmembrane Pressure (mmHg): 60 mmHg Ultrafiltration Rate (mL/min): 500 mL/min Dialysate Flow Rate (mL/min): 500 ml/min Conductivity: Machine : 13.7 Conductivity: Machine : 13.7 Dialysis Fluid Bolus: Normal Saline Bolus Amount (mL): 250 mL Dialysate Change: Other (comment) (3k)  Pain and discomfort at this request trach suctioning, HD RN contacting respiratory therapy   Objective:  Vital signs in last 24 hours:  Temp:  [98.1 F (36.7 C)-99.3 F (37.4 C)] 98.8 F (37.1 C) (02/03 0924) Pulse Rate:  [61-73] 73 (02/03 1015) Resp:  [10-26] 18 (02/03 1015) BP: (103-157)/(48-74) 156/71 (02/03 1015) SpO2:  [90 %-100 %] 92 % (02/03 1015) FiO2 (%):  [28 %] 28 % (02/02 2014) Weight:  [63.3 kg-67 kg] 67 kg (02/03 0925)  Weight change: 8.2 kg Filed Weights   01/05/22 0500 01/05/22 1648 01/06/22 0925  Weight: 63.3 kg 63.3 kg 67 kg    Intake/Output: I/O last 3 completed shifts: In: 600 [P.O.:600] Out: -    Intake/Output this shift:  No intake/output data recorded.  Physical Exam: General: NAD  Head: Normocephalic, atraumatic. Moist oral mucosal membranes  Eyes: Anicteric  Lungs:  Clear to auscultation, normal effort, midline trach  Heart: Regular rate and rhythm  Abdomen:  Soft, nontender  Extremities: No peripheral edema  Neurologic: Nonfocal, moving all four extremities  Skin: No lesions  Access: Left aVF    Basic Metabolic  Panel: Recent Labs  Lab 01/03/22 0020 01/04/22 0236 01/04/22 0238 01/05/22 0515  NA 136  --  133* 138  K 4.9  --  4.3 4.0  CL 99  --  93* 100  CO2 19*  --  29 29  GLUCOSE 128*  --  232* 123*  BUN 169*  --  69* 41*  CREATININE 16.04*  --  8.65* 6.95*  CALCIUM 8.4*  --  8.3* 8.4*  MG  --  1.5*  --  1.7     Liver Function Tests: Recent Labs  Lab 01/03/22 0020  AST 15  ALT 14  ALKPHOS 89  BILITOT 0.7  PROT 7.3  ALBUMIN 3.8    No results for input(s): LIPASE, AMYLASE in the last 168 hours. No results for input(s): AMMONIA in the last 168 hours.  CBC: Recent Labs  Lab 01/03/22 0020 01/04/22 0238 01/05/22 0515  WBC 7.1 7.8 5.8  NEUTROABS 4.8 6.8  --   HGB 10.4* 10.0* 9.6*  HCT 30.7* 29.3* 29.3*  MCV 89.5 86.9 91.3  PLT 162 163 153     Cardiac Enzymes: No results for input(s): CKTOTAL, CKMB, CKMBINDEX, TROPONINI in the last 168 hours.  BNP: Invalid input(s): POCBNP  CBG: Recent Labs  Lab 01/03/22 0034 01/03/22 2316 01/04/22 2031  GLUCAP 130* 117* 84     Microbiology: Results for orders placed or performed during the hospital encounter of 01/03/22  Resp Panel by RT-PCR (Flu A&B, Covid) Nasopharyngeal Swab     Status: None   Collection Time: 01/03/22 12:31 AM   Specimen: Nasopharyngeal Swab; Nasopharyngeal(NP) swabs in vial transport medium  Result Value Ref Range  Status   SARS Coronavirus 2 by RT PCR NEGATIVE NEGATIVE Final    Comment: (NOTE) SARS-CoV-2 target nucleic acids are NOT DETECTED.  The SARS-CoV-2 RNA is generally detectable in upper respiratory specimens during the acute phase of infection. The lowest concentration of SARS-CoV-2 viral copies this assay can detect is 138 copies/mL. A negative result does not preclude SARS-Cov-2 infection and should not be used as the sole basis for treatment or other patient management decisions. A negative result may occur with  improper specimen collection/handling, submission of specimen other than  nasopharyngeal swab, presence of viral mutation(s) within the areas targeted by this assay, and inadequate number of viral copies(<138 copies/mL). A negative result must be combined with clinical observations, patient history, and epidemiological information. The expected result is Negative.  Fact Sheet for Patients:  EntrepreneurPulse.com.au  Fact Sheet for Healthcare Providers:  IncredibleEmployment.be  This test is no t yet approved or cleared by the [place] FDA and  has been authorized for detection and/or diagnosis of SARS-CoV-2 by FDA under an Emergency Use Authorization (EUA). This EUA will remain  in effect (meaning this test can be used) for the duration of the COVID-19 declaration under Section 564(b)(1) of the Act, 21 U.S.C.section 360bbb-3(b)(1), unless the authorization is terminated  or revoked sooner.       Influenza A by PCR NEGATIVE NEGATIVE Final   Influenza B by PCR NEGATIVE NEGATIVE Final    Comment: (NOTE) The Xpert Xpress SARS-CoV-2/FLU/RSV plus assay is intended as an aid in the diagnosis of influenza from Nasopharyngeal swab specimens and should not be used as a sole basis for treatment. Nasal washings and aspirates are unacceptable for Xpert Xpress SARS-CoV-2/FLU/RSV testing.  Fact Sheet for Patients: EntrepreneurPulse.com.au  Fact Sheet for Healthcare Providers: IncredibleEmployment.be  This test is not yet approved or cleared by the [place] FDA and has been authorized for detection and/or diagnosis of SARS-CoV-2 by FDA under an Emergency Use Authorization (EUA). This EUA will remain in effect (meaning this test can be used) for the duration of the COVID-19 declaration under Section 564(b)(1) of the Act, 21 U.S.C. section 360bbb-3(b)(1), unless the authorization is terminated or revoked.  Performed at [name], [address] [place], [place]  [address]     Coagulation Studies: No results for input(s): LABPROT, INR in the last 72 hours.  Urinalysis: No results for input(s): COLORURINE, LABSPEC, PHURINE, GLUCOSEU, HGBUR, BILIRUBINUR, KETONESUR, PROTEINUR, UROBILINOGEN, NITRITE, LEUKOCYTESUR in the last 72 hours.  Invalid input(s): APPERANCEUR    Imaging: ECHOCARDIOGRAM COMPLETE  Result Date: 01/04/2022    ECHOCARDIOGRAM REPORT   Patient Name:   Deborah [name] Date of Exam: 01/04/2022 Medical Rec #:  [id number]     Height:       62.0 in Accession #:    [id number]    Weight:       147.9 lb Date of Birth:  1956-10-01     BSA:          1.682 m Patient Age:    75 years      BP:           93/53 mmHg Patient Gender: F             HR:           66 bpm. Exam Location:  [facility] Procedure: 2D Echo, Color Doppler and Cardiac Doppler Indications:     I50.31 congestive heart failure-Acute Diastolic  History:         Patient has  prior history of Echocardiogram examinations. ESRD;                  Risk Factors:Hypertension, Diabetes and Dyslipidemia.  Sonographer:     Charmayne Sheer Referring Phys:  [id number] JAN A MANSY Diagnosing Phys: Kate Sable MD  Sonographer Comments: Suboptimal subcostal window. IMPRESSIONS  1. Left ventricular ejection fraction, by estimation, is 55%. The left ventricle has normal function. The left ventricle has no regional wall motion abnormalities. Left ventricular diastolic parameters are consistent with Grade II diastolic dysfunction (pseudonormalization).  2. Right ventricular systolic function is normal. The right ventricular size is normal.  3. The mitral valve has been repaired/replaced. No evidence of mitral valve regurgitation. The mean mitral valve gradient is 6.0 mmHg. Echo findings are consistent with normal structure and function of the mitral valve prosthesis.  4. The aortic valve is calcified. Aortic valve regurgitation is not visualized. Aortic valve sclerosis/calcification is present, without any evidence of aortic  stenosis.  5. The inferior vena cava is normal in size with greater than 50% respiratory variability, suggesting right atrial pressure of 3 mmHg. FINDINGS  Left Ventricle: Left ventricular ejection fraction, by estimation, is 55%. The left ventricle has normal function. The left ventricle has no regional wall motion abnormalities. The left ventricular internal cavity size was normal in size. There is no left ventricular hypertrophy. Left ventricular diastolic parameters are consistent with Grade II diastolic dysfunction (pseudonormalization). Right Ventricle: The right ventricular size is normal. No increase in right ventricular wall thickness. Right ventricular systolic function is normal. Left Atrium: Left atrial size was normal in size. Right Atrium: Right atrial size was normal in size. Pericardium: There is no evidence of pericardial effusion. Mitral Valve: The mitral valve has been repaired/replaced. No evidence of mitral valve regurgitation. 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 Echo findings are consistent with normal structure and function of the mitral valve prosthesis. MV peak gradient, 12.4 mmHg. The mean mitral valve gradient is 6.0 mmHg. Tricuspid Valve: The tricuspid valve is normal in structure. Tricuspid valve regurgitation is trivial. Aortic Valve: The aortic valve is calcified. Aortic valve regurgitation is not visualized. Aortic valve sclerosis/calcification is present, without any evidence of aortic stenosis. Aortic valve mean gradient measures 6.0 mmHg. Aortic valve peak gradient measures 10.9 mmHg. Aortic valve area, by VTI measures 1.61 cm. Pulmonic Valve: The pulmonic valve was not well visualized. Pulmonic valve regurgitation is not visualized. Aorta: The aortic root is normal in size and structure. Venous: The inferior vena cava is normal in size with greater than 50% respiratory variability, suggesting right atrial pressure of 3 mmHg. IAS/Shunts: No atrial level  shunt detected by color flow Doppler.  LEFT VENTRICLE PLAX 2D LVIDd:         4.71 cm   Diastology LVIDs:         2.54 cm   LV e' medial:    4.79 cm/s LV PW:         1.03 cm   LV E/e' medial:  28.8 LV IVS:        0.96 cm   LV e' lateral:   3.81 cm/s LVOT diam:     2.10 cm   LV E/e' lateral: 36.2 LV SV:         54 LV SV Index:   32 LVOT Area:     3.46 cm  RIGHT VENTRICLE RV Basal diam:  3.25 cm LEFT ATRIUM  Index        RIGHT ATRIUM           Index LA diam:      2.60 cm 1.55 cm/m   RA Area:     14.00 cm LA Vol (A4C): 36.2 ml 21.53 ml/m  RA Volume:   28.90 ml  17.18 ml/m  AORTIC VALVE                     PULMONIC VALVE AV Area (Vmax):    1.51 cm      PV Vmax:       0.77 m/s AV Area (Vmean):   1.52 cm      PV Vmean:      56.200 cm/s AV Area (VTI):     1.61 cm      PV VTI:        0.172 m AV Vmax:           165.00 cm/s   PV Peak grad:  2.3 mmHg AV Vmean:          115.000 cm/s  PV Mean grad:  1.0 mmHg AV VTI:            0.338 m AV Peak Grad:      10.9 mmHg AV Mean Grad:      6.0 mmHg LVOT Vmax:         72.00 cm/s LVOT Vmean:        50.500 cm/s LVOT VTI:          0.157 m LVOT/AV VTI ratio: 0.46  AORTA Ao Root diam: 3.00 cm MITRAL VALVE MV Area (PHT): 3.46 cm     SHUNTS MV Area VTI:   1.02 cm     Systemic VTI:  0.16 m MV Peak grad:  12.4 mmHg    Systemic Diam: 2.10 cm MV Mean grad:  6.0 mmHg MV Vmax:       1.76 m/s MV Vmean:      116.0 cm/s MV Decel Time: 219 msec MV E velocity: 138.00 cm/s MV A velocity: 115.00 cm/s MV E/A ratio:  1.20 Kate Sable MD Electronically signed by Kate Sable MD Signature Date/Time: 01/04/2022/3:22:41 PM    Final      Medications:      albuterol  2.5 mg Nebulization TID   amLODipine  10 mg Oral Daily   apixaban  2.5 mg Oral BID   aspirin EC  81 mg Oral Daily   atorvastatin  80 mg Oral q1800   carvedilol  25 mg Oral BID   Chlorhexidine Gluconate Cloth  6 each Topical Q0600   escitalopram  10 mg Oral Daily   furosemide  60 mg Intravenous Q12H    gabapentin  300 mg Oral QHS   irbesartan  150 mg Oral Daily   midodrine  5 mg Oral Once per day on Mon Wed Fri   sevelamer carbonate  1,600 mg Oral TID WC   traZODone  50 mg Oral Once per day on Mon Wed Fri   acetaminophen **OR** acetaminophen, magnesium hydroxide, ondansetron **OR** ondansetron (ZOFRAN) IV, traZODone  Assessment/ Plan:  [name] is a 66 y.o.  female with a past medical history including diabetes, hyperlipidemia, hypertension, CHF, glottic stenosis status post chronic tracheostomy, and end-stage renal disease on dialysis.  Patient presents to the emergency department with complaints of shortness of breath.  Patient states she has missed the previous 2 dialysis treatments due to home health nurse inability to accompany  patient to outpatient treatments.   [facility] [address]/MWF/Lt AVF/ 64kg  Respiratory distress with end-stage renal disease on dialysis.    Currently receiving dialysis with no complications.  Tolerating well.  Case manager assisting with arrangements pertaining to certify x-ray.  According patient to outpatient dialysis treatment.  Once this is arranged, patient cleared to discharge.  Next treatment scheduled for Monday  2. Anemia of chronic kidney disease Lab Results  Component Value Date   HGB 9.6 (L) 01/05/2022  Hemoglobin remains within desired target.  We will continue to monitor  3. Secondary Hyperparathyroidism:  Lab Results  Component Value Date   PTH 169 (H) 11/12/2020   CALCIUM 8.4 (L) 01/05/2022   CAION 1.09 (L) 09/01/2016   PHOS 4.5 11/12/2020    We will obtain updated labs in a.m.   LOS: 2   2/3/202310:37 AM

## 2022-01-06 NOTE — Care Management Important Message (Signed)
Important Message  Patient Details  Name: Deborah [name] MRN: [id number] Date of Birth: Apr 24, 1956   Medicare Important Message Given:  N/A - LOS <3 / Initial given by admissions     [name] 01/06/2022, 7:21 AM

## 2022-01-07 DIAGNOSIS — I5033 Acute on chronic diastolic (congestive) heart failure: Secondary | ICD-10-CM | POA: Diagnosis not present

## 2022-01-07 DIAGNOSIS — N186 End stage renal disease: Secondary | ICD-10-CM | POA: Diagnosis not present

## 2022-01-07 LAB — CBC
HCT: 29.2 % — ABNORMAL LOW (ref 36.0–46.0)
Hemoglobin: 9.7 g/dL — ABNORMAL LOW (ref 12.0–15.0)
MCH: 29.5 pg (ref 26.0–34.0)
MCHC: 33.2 g/dL (ref 30.0–36.0)
MCV: 88.8 fL (ref 80.0–100.0)
Platelets: 180 10*3/uL (ref 150–400)
RBC: 3.29 MIL/uL — ABNORMAL LOW (ref 3.87–5.11)
RDW: 15.9 % — ABNORMAL HIGH (ref 11.5–15.5)
WBC: 7.3 10*3/uL (ref 4.0–10.5)
nRBC: 0 % (ref 0.0–0.2)

## 2022-01-07 LAB — RENAL FUNCTION PANEL
Albumin: 3.4 g/dL — ABNORMAL LOW (ref 3.5–5.0)
Anion gap: 10 (ref 5–15)
BUN: 39 mg/dL — ABNORMAL HIGH (ref 8–23)
CO2: 31 mmol/L (ref 22–32)
Calcium: 8.9 mg/dL (ref 8.9–10.3)
Chloride: 92 mmol/L — ABNORMAL LOW (ref 98–111)
Creatinine, Ser: 6.55 mg/dL — ABNORMAL HIGH (ref 0.44–1.00)
GFR, Estimated: 7 mL/min — ABNORMAL LOW (ref >60)
Glucose, Bld: 131 mg/dL — ABNORMAL HIGH (ref 70–99)
Phosphorus: 4 mg/dL (ref 2.5–4.6)
Potassium: 4.2 mmol/L (ref 3.5–5.1)
Sodium: 133 mmol/L — ABNORMAL LOW (ref 135–145)

## 2022-01-07 LAB — GLUCOSE, CAPILLARY
Glucose-Capillary: 120 mg/dL — ABNORMAL HIGH (ref 70–99)
Glucose-Capillary: 242 mg/dL — ABNORMAL HIGH (ref 70–99)

## 2022-01-07 LAB — HEPATITIS B SURFACE ANTIBODY, QUANTITATIVE: Hep B S AB Quant (Post): >1000 m[IU]/mL (ref >9.9)

## 2022-01-07 NOTE — Progress Notes (Signed)
[facility]  PROGRESS NOTE   Subjective:   Seen at bedside comfortable. She had stable dialysis yesterday.  Objective:  Vital signs in last 24 hours:  Temp:  [98.1 F (36.7 C)-99.2 F (37.3 C)] 98.7 F (37.1 C) (02/04 1206) Pulse Rate:  [61-73] 70 (02/04 1206) Resp:  [14-18] 18 (02/04 1206) BP: (107-133)/(55-80) 107/57 (02/04 1206) SpO2:  [93 %-100 %] 96 % (02/04 1206) FiO2 (%):  [28 %] 28 % (02/04 0708) Weight:  [70.7 kg] 70.7 kg (02/04 0500)  Weight change: 3.7 kg Filed Weights   01/06/22 1232 01/06/22 1327 01/07/22 0500  Weight: 63.5 kg 63.5 kg 70.7 kg    Intake/Output: I/O last 3 completed shifts: In: 240 [P.O.:240] Out: 1006 [Other:1006]   Intake/Output this shift:  No intake/output data recorded.  Physical Exam: General:  No acute distress  Head:  Normocephalic, atraumatic. Moist oral mucosal membranes  Eyes:  Anicteric  Neck:  Supple  Lungs:   Clear to auscultation, normal effort  Heart:  S1S2 no rubs  Abdomen:   Soft, nontender, bowel sounds present  Extremities:  peripheral edema.  Neurologic:  Awake, alert, following commands  Skin:  No lesions  Access:     Basic Metabolic Panel: Recent Labs  Lab 01/03/22 0020 01/04/22 0236 01/04/22 0238 01/05/22 0515 01/07/22 0554  NA 136  --  133* 138 133*  K 4.9  --  4.3 4.0 4.2  CL 99  --  93* 100 92*  CO2 19*  --  29 29 31   GLUCOSE 128*  --  232* 123* 131*  BUN 169*  --  69* 41* 39*  CREATININE 16.04*  --  8.65* 6.95* 6.55*  CALCIUM 8.4*  --  8.3* 8.4* 8.9  MG  --  1.5*  --  1.7  --   PHOS  --   --   --   --  4.0    CBC: Recent Labs  Lab 01/03/22 0020 01/04/22 0238 01/05/22 0515 01/07/22 0554  WBC 7.1 7.8 5.8 7.3  NEUTROABS 4.8 6.8  --   --   HGB 10.4* 10.0* 9.6* 9.7*  HCT 30.7* 29.3* 29.3* 29.2*  MCV 89.5 86.9 91.3 88.8  PLT 162 163 153 180     Urinalysis: No results for input(s): COLORURINE, LABSPEC, PHURINE, GLUCOSEU, HGBUR, BILIRUBINUR, KETONESUR, PROTEINUR,  UROBILINOGEN, NITRITE, LEUKOCYTESUR in the last 72 hours.  Invalid input(s): APPERANCEUR    Imaging: No results found.   Medications:     albuterol  2.5 mg Nebulization TID   amLODipine  10 mg Oral Daily   apixaban  2.5 mg Oral BID   aspirin EC  81 mg Oral Daily   atorvastatin  80 mg Oral q1800   carvedilol  25 mg Oral BID   Chlorhexidine Gluconate Cloth  6 each Topical Q0600   escitalopram  10 mg Oral Daily   furosemide  40 mg Oral Once per day on Sun Tue Thu Sat   gabapentin  300 mg Oral QHS   irbesartan  150 mg Oral Daily   midodrine  5 mg Oral Once per day on Mon Wed Fri   sevelamer carbonate  1,600 mg Oral TID WC   traZODone  50 mg Oral Once per day on Mon Wed Fri    Assessment/ Plan:     Principal Problem:   Acute pulmonary edema ([name]) Active Problems:   ESRD (end stage renal disease) ([name])   Acute on chronic diastolic CHF (congestive heart failure) ([place])  66 year old  female with history of hypertension, coronary artery disease, congestive heart failure, diabetes, peripheral vascular disease, vent failure with chronic tracheostomy and end-stage renal disease on dialysis.  She was admitted now with shortness of breath.  #1: ESRD: Patient is stable dialysis via left  AV graft.  She is on a Monday Wednesday Friday schedule.  Next treatment is scheduled for Monday.  #2: Anemia: Continue to monitor and follow anemia protocols.  #3: Secondary hyperparathyroidism: We will continue to monitor PTH, calcium and phosphorus.  Continue sevelamer at the present doses.  #4: Hypertension/CHF: Continue furosemide orally along with other antihypertensive medications.  We will continue to monitor closely.   LOS: [place], MD [facility] 2/4/20231:42 PM

## 2022-01-07 NOTE — TOC Progression Note (Signed)
Transition of Care [facility]) - Progression Note    Patient Details  Name: Deborah [name] MRN: [id number] Date of Birth: 1956/09/08  [facility] CM/SW Contact  Zigmund Daniel Dorian Pod, RN Phone [phone] 01/07/2022, 3:05 PM  Clinical Narrative:    Spoke with daughter [name] today concerning update on pt's HHealth services with [facility]. Daughter aware [name] is not abe to accommodate pt's care (staffing issues) has been documented. Daughter aware we are awaiting [place] to decide if they are able to accommodate pt's care for assistance with HD treatments.Daughter aware to follow up with social worker on Monday however [facility] may not be able to make a decision. Daughter also aware Estill Bamberg with HD services here at [facility] available if needed. No other issues or needs to address at this time. Daughter also mentioned she has another resource that pt has used in the past for HD treatment if [facility] is not able to accommodate pt's needs.  TC will remain available to assist further with any other needs.       Expected Discharge Plan and Services                                                 Social Determinants of Health (SDOH) Interventions    Readmission Risk Interventions Readmission Risk Prevention Plan 11/04/2020 04/02/2020 10/18/2019  Transportation Screening Complete Complete Complete  Medication Review Press photographer) (No Data) Complete -  PCP or Specialist appointment within 3-5 days of discharge Complete Complete Complete  HRI or Home Care Consult (No Data) Complete -  SW Recovery Care/Counseling Consult Complete - -  Palliative Care Screening Not Applicable - -  [place] Not Applicable Complete Not Applicable  Some recent data might be hidden

## 2022-01-07 NOTE — Progress Notes (Signed)
Progress Note    Deborah [name]  [name] DOB: 02-Oct-1956  DOA: 01/03/2022 PCP: Leta Speller, MD      Brief Narrative:    Medical records reviewed and are as summarized below:  Deborah [name] is a 66 y.o. female with medical history significant for atrial fibrillation on Eliquis, type II DM, ESRD on hemodialysis on M, W and F, hypertension, dyslipidemia, glottic stenosis s/p tracheostomy, who presented to the hospital because of wheezing, worsening shortness of breath and productive cough after missing 2 outpatient hemodialysis sessions.  Patient has to be accompanied by a nurse to the dialysis unit but apparently the nurses have been calling out and so she had not been able to go to dialysis.  She was diagnosed with acute pulmonary edema/volume overload/acute on chronic diastolic CHF from missed hemodialysis.  She was emergently dialyzed in the emergency room with plan for discharge back home.  However, she became acutely short of breath with increasing oxygen requirement up to 5 L while waiting to be discharged from the emergency room.  She was subsequently admitted to the hospital for further management.         Assessment/Plan:   Principal Problem:   Acute pulmonary edema ([name]) Active Problems:   ESRD (end stage renal disease) ([name])   Acute on chronic diastolic CHF (congestive heart failure) ([name])   Body mass index is 28.5 kg/m.   Acute pulmonary edema/volume overload/acute on chronic diastolic CHF: Continue Lasix.  ESRD: Follow-up with nephrologist for hemodialysis.  Acute on chronic hypoxic respiratory failure: Oxygen has been weaned down to 3 L/min which is about her baseline.  Paroxysmal atrial fibrillation: Continue carvedilol and Eliquis  Other comorbidities include depression, type II DM with peripheral neuropathy, hypertension, dyslipidemia, GERD  Diet Order             Diet Heart Room service appropriate? Yes; Fluid consistency: Thin   Diet effective now                       Consultants: Nephrologist  Procedures: None    Medications:    albuterol  2.5 mg Nebulization TID   amLODipine  10 mg Oral Daily   apixaban  2.5 mg Oral BID   aspirin EC  81 mg Oral Daily   atorvastatin  80 mg Oral q1800   carvedilol  25 mg Oral BID   Chlorhexidine Gluconate Cloth  6 each Topical Q0600   escitalopram  10 mg Oral Daily   furosemide  40 mg Oral Once per day on Sun Tue Thu Sat   gabapentin  300 mg Oral QHS   irbesartan  150 mg Oral Daily   midodrine  5 mg Oral Once per day on Mon Wed Fri   sevelamer carbonate  1,600 mg Oral TID WC   traZODone  50 mg Oral Once per day on Mon Wed Fri   Continuous Infusions:     Anti-infectives (From admission, onward)    None              Family Communication/Anticipated D/C date and plan/Code Status   DVT prophylaxis: apixaban (ELIQUIS) tablet 2.5 mg Start: 01/03/22 2200 apixaban (ELIQUIS) tablet 2.5 mg     Code Status: Full Code  Family Communication: None Disposition Plan: Plan to discharge home when safe to do so.   Status is: Inpatient Remains inpatient appropriate because: Unsafe discharge.  She needs a nurse specialized in trach care to accompany her  to hemodialysis.  Case manager is helping to coordinate this              Subjective:   No complaints.  No shortness of breath or chest pain.  Objective:    Vitals:   01/07/22 0708 01/07/22 0715 01/07/22 0933 01/07/22 1206  BP:  (!) 124/55 129/69 (!) 107/57  Pulse: 62 65 65 70  Resp: 16 18  18   Temp:  99.2 F (37.3 C)  98.7 F (37.1 C)  TempSrc:  Oral  Oral  SpO2: 94% 97% 93% 96%  Weight:      Height:       No data found.   Intake/Output Summary (Last 24 hours) at 01/07/2022 1220 Last data filed at 01/06/2022 1848 Gross per 24 hour  Intake 240 ml  Output 1006 ml  Net -766 ml   Filed Weights   01/06/22 1232 01/06/22 1327 01/07/22 0500  Weight: 63.5 kg 63.5 kg 70.7 kg     Exam:   GEN: NAD SKIN: No rash EYES: EOMI ENT: MMM, +trach CV: RRR PULM: CTA B ABD: soft, ND, NT, +BS CNS: AAO x 3, non focal EXT: No edema or tenderness           Data Reviewed:   I have personally reviewed following labs and imaging studies:  Labs: Labs show the following:   Basic Metabolic Panel: Recent Labs  Lab 01/03/22 0020 01/04/22 0236 01/04/22 0238 01/05/22 0515 01/07/22 0554  NA 136  --  133* 138 133*  K 4.9  --  4.3 4.0 4.2  CL 99  --  93* 100 92*  CO2 19*  --  29 29 31   GLUCOSE 128*  --  232* 123* 131*  BUN 169*  --  69* 41* 39*  CREATININE 16.04*  --  8.65* 6.95* 6.55*  CALCIUM 8.4*  --  8.3* 8.4* 8.9  MG  --  1.5*  --  1.7  --   PHOS  --   --   --   --  4.0   GFR Estimated Creatinine Clearance: 8 mL/min (A) (by C-G formula based on SCr of 6.55 mg/dL (H)). Liver Function Tests: Recent Labs  Lab 01/03/22 0020 01/07/22 0554  AST 15  --   ALT 14  --   ALKPHOS 89  --   BILITOT 0.7  --   PROT 7.3  --   ALBUMIN 3.8 3.4*   No results for input(s): LIPASE, AMYLASE in the last 168 hours. No results for input(s): AMMONIA in the last 168 hours. Coagulation profile No results for input(s): INR, PROTIME in the last 168 hours.  CBC: Recent Labs  Lab 01/03/22 0020 01/04/22 0238 01/05/22 0515 01/07/22 0554  WBC 7.1 7.8 5.8 7.3  NEUTROABS 4.8 6.8  --   --   HGB 10.4* 10.0* 9.6* 9.7*  HCT 30.7* 29.3* 29.3* 29.2*  MCV 89.5 86.9 91.3 88.8  PLT 162 163 153 180   Cardiac Enzymes: No results for input(s): CKTOTAL, CKMB, CKMBINDEX, TROPONINI in the last 168 hours. BNP (last 3 results) No results for input(s): PROBNP in the last 8760 hours. CBG: Recent Labs  Lab 01/03/22 2316 01/04/22 2031 01/06/22 2127 01/07/22 0740 01/07/22 1204  GLUCAP 117* 99 223* 120* 242*   D-Dimer: No results for input(s): DDIMER in the last 72 hours. Hgb A1c: No results for input(s): HGBA1C in the last 72 hours. Lipid Profile: No results for  input(s): CHOL, HDL, LDLCALC, TRIG, CHOLHDL, LDLDIRECT in the last 72  hours. Thyroid function studies: No results for input(s): TSH, T4TOTAL, T3FREE, THYROIDAB in the last 72 hours.  Invalid input(s): FREET3 Anemia work up: No results for input(s): VITAMINB12, FOLATE, FERRITIN, TIBC, IRON, RETICCTPCT in the last 72 hours. Sepsis Labs: Recent Labs  Lab 01/03/22 0020 01/04/22 0238 01/05/22 0515 01/07/22 0554  WBC 7.1 7.8 5.8 7.3    Microbiology Recent Results (from the past 240 hour(s))  Resp Panel by RT-PCR (Flu A&B, Covid) Nasopharyngeal Swab     Status: None   Collection Time: 01/03/22 12:31 AM   Specimen: Nasopharyngeal Swab; Nasopharyngeal(NP) swabs in vial transport medium  Result Value Ref Range Status   SARS Coronavirus 2 by RT PCR NEGATIVE NEGATIVE Final    Comment: (NOTE) SARS-CoV-2 target nucleic acids are NOT DETECTED.  The SARS-CoV-2 RNA is generally detectable in upper respiratory specimens during the acute phase of infection. The lowest concentration of SARS-CoV-2 viral copies this assay can detect is 138 copies/mL. A negative result does not preclude SARS-Cov-2 infection and should not be used as the sole basis for treatment or other patient management decisions. A negative result may occur with  improper specimen collection/handling, submission of specimen other than nasopharyngeal swab, presence of viral mutation(s) within the areas targeted by this assay, and inadequate number of viral copies(<138 copies/mL). A negative result must be combined with clinical observations, patient history, and epidemiological information. The expected result is Negative.  Fact Sheet for Patients:  EntrepreneurPulse.com.au  Fact Sheet for Healthcare Providers:  IncredibleEmployment.be  This test is no t yet approved or cleared by the [place] FDA and  has been authorized for detection and/or diagnosis of SARS-CoV-2 by FDA under an  Emergency Use Authorization (EUA). This EUA will remain  in effect (meaning this test can be used) for the duration of the COVID-19 declaration under Section 564(b)(1) of the Act, 21 U.S.C.section 360bbb-3(b)(1), unless the authorization is terminated  or revoked sooner.       Influenza A by PCR NEGATIVE NEGATIVE Final   Influenza B by PCR NEGATIVE NEGATIVE Final    Comment: (NOTE) The Xpert Xpress SARS-CoV-2/FLU/RSV plus assay is intended as an aid in the diagnosis of influenza from Nasopharyngeal swab specimens and should not be used as a sole basis for treatment. Nasal washings and aspirates are unacceptable for Xpert Xpress SARS-CoV-2/FLU/RSV testing.  Fact Sheet for Patients: EntrepreneurPulse.com.au  Fact Sheet for Healthcare Providers: IncredibleEmployment.be  This test is not yet approved or cleared by the [place] FDA and has been authorized for detection and/or diagnosis of SARS-CoV-2 by FDA under an Emergency Use Authorization (EUA). This EUA will remain in effect (meaning this test can be used) for the duration of the COVID-19 declaration under Section 564(b)(1) of the Act, 21 U.S.C. section 360bbb-3(b)(1), unless the authorization is terminated or revoked.  Performed at [facility], [address] [place], [place] [address]     Procedures and diagnostic studies:  No results found.             LOS: 3 days   Sybel Standish  [facility]   Pager on [email or web address]. If 7PM-7AM, please contact night-coverage at [email or web address]     01/07/2022, 12:20 PM

## 2022-01-08 DIAGNOSIS — I5033 Acute on chronic diastolic (congestive) heart failure: Secondary | ICD-10-CM | POA: Diagnosis not present

## 2022-01-08 DIAGNOSIS — N186 End stage renal disease: Secondary | ICD-10-CM | POA: Diagnosis not present

## 2022-01-08 LAB — GLUCOSE, CAPILLARY
Glucose-Capillary: 253 mg/dL — ABNORMAL HIGH (ref 70–99)
Glucose-Capillary: 162 mg/dL — ABNORMAL HIGH (ref 70–99)
Glucose-Capillary: 203 mg/dL — ABNORMAL HIGH (ref 70–99)

## 2022-01-08 MED ORDER — INSULIN ASPART 100 UNIT/ML IJ SOLN
0.0000 [IU] | Freq: Three times a day (TID) | INTRAMUSCULAR | Status: DC
  Administered 2022-01-08: 3 [IU] via SUBCUTANEOUS
  Administered 2022-01-08 – 2022-01-11 (×5): 1 [IU] via SUBCUTANEOUS
  Administered 2022-01-12: 17:00:00 2 [IU] via SUBCUTANEOUS
  Administered 2022-01-14 – 2022-01-15 (×2): 1 [IU] via SUBCUTANEOUS
  Administered 2022-01-15: 12:00:00 2 [IU] via SUBCUTANEOUS
  Administered 2022-01-16: 1 [IU] via SUBCUTANEOUS
  Filled 2022-01-08 (×12): qty 1

## 2022-01-08 MED ORDER — INSULIN DETEMIR 100 UNIT/ML ~~LOC~~ SOLN
12.0000 [IU] | Freq: Every day | SUBCUTANEOUS | Status: DC
  Administered 2022-01-08 – 2022-01-16 (×9): 12 [IU] via SUBCUTANEOUS
  Filled 2022-01-08 (×10): qty 0.12

## 2022-01-08 NOTE — Progress Notes (Signed)
Progress Note    Deborah [name]  SFK:[id number] DOB: 01-04-1956  DOA: 01/03/2022 PCP: Leta Speller, MD      Brief Narrative:    Medical records reviewed and are as summarized below:  Deborah [name] is a 65 y.o. female with medical history significant for atrial fibrillation on Eliquis, type II DM, ESRD on hemodialysis on M, W and F, hypertension, dyslipidemia, glottic stenosis s/p tracheostomy, who presented to the hospital because of wheezing, worsening shortness of breath and productive cough after missing 2 outpatient hemodialysis sessions.  Patient has to be accompanied by a nurse to the dialysis unit but apparently the nurses have been calling out and so she had not been able to go to dialysis.  She was diagnosed with acute pulmonary edema/volume overload/acute on chronic diastolic CHF from missed hemodialysis.  She was emergently dialyzed in the emergency room with plan for discharge back home.  However, she became acutely short of breath with increasing oxygen requirement up to 5 L while waiting to be discharged from the emergency room.  She was subsequently admitted to the hospital for further management.         Assessment/Plan:   Principal Problem:   Acute pulmonary edema ([name]) Active Problems:   ESRD (end stage renal disease) ([name])   Acute on chronic diastolic CHF (congestive heart failure) ([name])   Body mass index is 27.78 kg/m.   Acute pulmonary edema/volume overload/acute on chronic diastolic CHF: Continue oral Lasix  ESRD: She will be dialyzed tomorrow.  Follow-up with nephrologist.  Acute on chronic hypoxic respiratory failure: Continue therapy at this point minute oxygen via trach collar as needed  Paroxysmal atrial fibrillation: Continue carvedilol and Eliquis  Type II DM with hyperglycemia: Restart home Levemir 12 units nightly.  NovoLog as needed for hyperglycemia  Other comorbidities include depression, type II DM with peripheral  neuropathy, hypertension, dyslipidemia, GERD  Diet Order             Diet Heart Room service appropriate? Yes; Fluid consistency: Thin  Diet effective now                       Consultants: Nephrologist  Procedures: None    Medications:    albuterol  2.5 mg Nebulization TID   amLODipine  10 mg Oral Daily   apixaban  2.5 mg Oral BID   aspirin EC  81 mg Oral Daily   atorvastatin  80 mg Oral q1800   carvedilol  25 mg Oral BID   Chlorhexidine Gluconate Cloth  6 each Topical Q0600   escitalopram  10 mg Oral Daily   furosemide  40 mg Oral Once per day on Sun Tue Thu Sat   gabapentin  300 mg Oral QHS   insulin aspart  0-6 Units Subcutaneous TID WC   insulin detemir  12 Units Subcutaneous QHS   irbesartan  150 mg Oral Daily   midodrine  5 mg Oral Once per day on Mon Wed Fri   sevelamer carbonate  1,600 mg Oral TID WC   traZODone  50 mg Oral Once per day on Mon Wed Fri   Continuous Infusions:     Anti-infectives (From admission, onward)    None              Family Communication/Anticipated D/C date and plan/Code Status   DVT prophylaxis: apixaban (ELIQUIS) tablet 2.5 mg Start: 01/03/22 2200 apixaban (ELIQUIS) tablet 2.5 mg     Code  Status: Full Code  Family Communication: None Disposition Plan: Plan to discharge home when safe to do so.   Status is: Inpatient Remains inpatient appropriate because: Unsafe discharge.  She needs a nurse specialized in trach care to accompany her to hemodialysis.  Case manager is helping to coordinate this              Subjective:   Interval events noted.  No chest pain or shortness of breath  Objective:    Vitals:   01/07/22 2045 01/08/22 0452 01/08/22 0453 01/08/22 0753  BP: (!) 132/56 (!) 125/57  (!) 130/59  Pulse: 64 61  62  Resp: 16 16  14   Temp: 98.6 F (37 C) 97.9 F (36.6 C)  98.2 F (36.8 C)  TempSrc:  Oral  Oral  SpO2: 98% 93%  99%  Weight:   68.9 kg   Height:       No data  found.  No intake or output data in the 24 hours ending 01/08/22 1009  Filed Weights   01/06/22 1327 01/07/22 0500 01/08/22 0453  Weight: 63.5 kg 70.7 kg 68.9 kg    Exam:   GEN: NAD SKIN: No rash EYES: EOMI ENT: MMM. +trach CV: RRR PULM: CTA B ABD: soft, ND, NT, +BS CNS: AAO x 3, non focal EXT: No edema or tenderness           Data Reviewed:   I have personally reviewed following labs and imaging studies:  Labs: Labs show the following:   Basic Metabolic Panel: Recent Labs  Lab 01/03/22 0020 01/04/22 0236 01/04/22 0238 01/05/22 0515 01/07/22 0554  NA 136  --  133* 138 133*  K 4.9  --  4.3 4.0 4.2  CL 99  --  93* 100 92*  CO2 19*  --  29 29 31   GLUCOSE 128*  --  232* 123* 131*  BUN 169*  --  69* 41* 39*  CREATININE 16.04*  --  8.65* 6.95* 6.55*  CALCIUM 8.4*  --  8.3* 8.4* 8.9  MG  --  1.5*  --  1.7  --   PHOS  --   --   --   --  4.0   GFR Estimated Creatinine Clearance: 7.9 mL/min (A) (by C-G formula based on SCr of 6.55 mg/dL (H)). Liver Function Tests: Recent Labs  Lab 01/03/22 0020 01/07/22 0554  AST 15  --   ALT 14  --   ALKPHOS 89  --   BILITOT 0.7  --   PROT 7.3  --   ALBUMIN 3.8 3.4*   No results for input(s): LIPASE, AMYLASE in the last 168 hours. No results for input(s): AMMONIA in the last 168 hours. Coagulation profile No results for input(s): INR, PROTIME in the last 168 hours.  CBC: Recent Labs  Lab 01/03/22 0020 01/04/22 0238 01/05/22 0515 01/07/22 0554  WBC 7.1 7.8 5.8 7.3  NEUTROABS 4.8 6.8  --   --   HGB 10.4* 10.0* 9.6* 9.7*  HCT 30.7* 29.3* 29.3* 29.2*  MCV 89.5 86.9 91.3 88.8  PLT 162 163 153 180   Cardiac Enzymes: No results for input(s): CKTOTAL, CKMB, CKMBINDEX, TROPONINI in the last 168 hours. BNP (last 3 results) No results for input(s): PROBNP in the last 8760 hours. CBG: Recent Labs  Lab 01/03/22 2316 01/04/22 2031 01/06/22 2127 01/07/22 0740 01/07/22 1204  GLUCAP 117* 99 223* 120* 242*    D-Dimer: No results for input(s): DDIMER in the last 72 hours. Hgb  A1c: No results for input(s): HGBA1C in the last 72 hours. Lipid Profile: No results for input(s): CHOL, HDL, LDLCALC, TRIG, CHOLHDL, LDLDIRECT in the last 72 hours. Thyroid function studies: No results for input(s): TSH, T4TOTAL, T3FREE, THYROIDAB in the last 72 hours.  Invalid input(s): FREET3 Anemia work up: No results for input(s): VITAMINB12, FOLATE, FERRITIN, TIBC, IRON, RETICCTPCT in the last 72 hours. Sepsis Labs: Recent Labs  Lab 01/03/22 0020 01/04/22 0238 01/05/22 0515 01/07/22 0554  WBC 7.1 7.8 5.8 7.3    Microbiology Recent Results (from the past 240 hour(s))  Resp Panel by RT-PCR (Flu A&B, Covid) Nasopharyngeal Swab     Status: None   Collection Time: 01/03/22 12:31 AM   Specimen: Nasopharyngeal Swab; Nasopharyngeal(NP) swabs in vial transport medium  Result Value Ref Range Status   SARS Coronavirus 2 by RT PCR NEGATIVE NEGATIVE Final    Comment: (NOTE) SARS-CoV-2 target nucleic acids are NOT DETECTED.  The SARS-CoV-2 RNA is generally detectable in upper respiratory specimens during the acute phase of infection. The lowest concentration of SARS-CoV-2 viral copies this assay can detect is 138 copies/mL. A negative result does not preclude SARS-Cov-2 infection and should not be used as the sole basis for treatment or other patient management decisions. A negative result may occur with  improper specimen collection/handling, submission of specimen other than nasopharyngeal swab, presence of viral mutation(s) within the areas targeted by this assay, and inadequate number of viral copies(<138 copies/mL). A negative result must be combined with clinical observations, patient history, and epidemiological information. The expected result is Negative.  Fact Sheet for Patients:  EntrepreneurPulse.com.au  Fact Sheet for Healthcare Providers:   IncredibleEmployment.be  This test is no t yet approved or cleared by the [place] FDA and  has been authorized for detection and/or diagnosis of SARS-CoV-2 by FDA under an Emergency Use Authorization (EUA). This EUA will remain  in effect (meaning this test can be used) for the duration of the COVID-19 declaration under Section 564(b)(1) of the Act, 21 U.S.C.section 360bbb-3(b)(1), unless the authorization is terminated  or revoked sooner.       Influenza A by PCR NEGATIVE NEGATIVE Final   Influenza B by PCR NEGATIVE NEGATIVE Final    Comment: (NOTE) The Xpert Xpress SARS-CoV-2/FLU/RSV plus assay is intended as an aid in the diagnosis of influenza from Nasopharyngeal swab specimens and should not be used as a sole basis for treatment. Nasal washings and aspirates are unacceptable for Xpert Xpress SARS-CoV-2/FLU/RSV testing.  Fact Sheet for Patients: EntrepreneurPulse.com.au  Fact Sheet for Healthcare Providers: IncredibleEmployment.be  This test is not yet approved or cleared by the [place] FDA and has been authorized for detection and/or diagnosis of SARS-CoV-2 by FDA under an Emergency Use Authorization (EUA). This EUA will remain in effect (meaning this test can be used) for the duration of the COVID-19 declaration under Section 564(b)(1) of the Act, 21 U.S.C. section 360bbb-3(b)(1), unless the authorization is terminated or revoked.  Performed at [facility] [address] [place], [place] [address]     Procedures and diagnostic studies:  No results found.             LOS: 4 days   Michail Boyte  [facility]   Pager on [email or web address]. If 7PM-7AM, please contact night-coverage at [email or web address]     01/08/2022, 10:09 AM

## 2022-01-08 NOTE — Progress Notes (Signed)
[facility]  PROGRESS NOTE   Subjective:   Patient feels well. Appetite much better  Objective:  Vital signs in last 24 hours:  Temp:  [97.9 F (36.6 C)-98.6 F (37 C)] 98 F (36.7 C) (02/05 1118) Pulse Rate:  [61-68] 66 (02/05 1118) Resp:  [14-18] 16 (02/05 1118) BP: (110-132)/(56-61) 118/61 (02/05 1118) SpO2:  [91 %-99 %] 96 % (02/05 1118) Weight:  [68.9 kg] 68.9 kg (02/05 0453)  Weight change: 1.9 kg Filed Weights   01/06/22 1327 01/07/22 0500 01/08/22 0453  Weight: 63.5 kg 70.7 kg 68.9 kg    Intake/Output: No intake/output data recorded.   Intake/Output this shift:  Total I/O In: 240 [P.O.:240] Out: -   Physical Exam: General:  No acute distress  Head:  Normocephalic, atraumatic. Moist oral mucosal membranes  Eyes:  Anicteric  Neck:  Supple  Lungs:   Clear to auscultation, normal effort  Heart:  S1S2 no rubs  Abdomen:   Soft, nontender, bowel sounds present  Extremities:  peripheral edema.  Neurologic:  Awake, alert, following commands  Skin:  No lesions  Access:     Basic Metabolic Panel: Recent Labs  Lab 01/03/22 0020 01/04/22 0236 01/04/22 0238 01/05/22 0515 01/07/22 0554  NA 136  --  133* 138 133*  K 4.9  --  4.3 4.0 4.2  CL 99  --  93* 100 92*  CO2 19*  --  29 29 31   GLUCOSE 128*  --  232* 123* 131*  BUN 169*  --  69* 41* 39*  CREATININE 16.04*  --  8.65* 6.95* 6.55*  CALCIUM 8.4*  --  8.3* 8.4* 8.9  MG  --  1.5*  --  1.7  --   PHOS  --   --   --   --  4.0    CBC: Recent Labs  Lab 01/03/22 0020 01/04/22 0238 01/05/22 0515 01/07/22 0554  WBC 7.1 7.8 5.8 7.3  NEUTROABS 4.8 6.8  --   --   HGB 10.4* 10.0* 9.6* 9.7*  HCT 30.7* 29.3* 29.3* 29.2*  MCV 89.5 86.9 91.3 88.8  PLT 162 163 153 180     Urinalysis: No results for input(s): COLORURINE, LABSPEC, PHURINE, GLUCOSEU, HGBUR, BILIRUBINUR, KETONESUR, PROTEINUR, UROBILINOGEN, NITRITE, LEUKOCYTESUR in the last 72 hours.  Invalid input(s): APPERANCEUR     Imaging: No results found.   Medications:     albuterol  2.5 mg Nebulization TID   amLODipine  10 mg Oral Daily   apixaban  2.5 mg Oral BID   aspirin EC  81 mg Oral Daily   atorvastatin  80 mg Oral q1800   carvedilol  25 mg Oral BID   Chlorhexidine Gluconate Cloth  6 each Topical Q0600   escitalopram  10 mg Oral Daily   furosemide  40 mg Oral Once per day on Sun Tue Thu Sat   gabapentin  300 mg Oral QHS   insulin aspart  0-6 Units Subcutaneous TID WC   insulin detemir  12 Units Subcutaneous QHS   irbesartan  150 mg Oral Daily   midodrine  5 mg Oral Once per day on Mon Wed Fri   sevelamer carbonate  1,600 mg Oral TID WC   traZODone  50 mg Oral Once per day on Mon Wed Fri    Assessment/ Plan:     Principal Problem:   Acute pulmonary edema ([name]) Active Problems:   ESRD (end stage renal disease) ([name])   Acute on chronic diastolic CHF (congestive heart  failure) ([place])  66 year old female with history of hypertension, coronary artery disease, congestive heart failure, diabetes, peripheral vascular disease, vent failure with chronic tracheostomy and end-stage renal disease on dialysis.  She was admitted now with shortness of breath.   #1: ESRD: Patient is stable dialysis via left  AV graft.  She is on a Monday Wednesday Friday schedule.  Next treatment is scheduled for Monday.   #2: Anemia: Continue to monitor and follow anemia protocols.   #3: Secondary hyperparathyroidism: We will continue to monitor PTH, calcium and phosphorus.  Continue sevelamer at the present doses.   #4: Hypertension/CHF: Continue furosemide orally along with other antihypertensive medications. Advised on importance of fluid restriction.  #5: Diabetes: Continue insulin as ordered and monitor for hypoglycemia.   We will continue to monitor closely.   LOS: [place], MD [facility] 2/5/20231:50 PM

## 2022-01-09 DIAGNOSIS — J810 Acute pulmonary edema: Secondary | ICD-10-CM | POA: Diagnosis not present

## 2022-01-09 DIAGNOSIS — I5033 Acute on chronic diastolic (congestive) heart failure: Secondary | ICD-10-CM | POA: Diagnosis not present

## 2022-01-09 DIAGNOSIS — N186 End stage renal disease: Secondary | ICD-10-CM | POA: Diagnosis not present

## 2022-01-09 LAB — GLUCOSE, CAPILLARY
Glucose-Capillary: 145 mg/dL — ABNORMAL HIGH (ref 70–99)
Glucose-Capillary: 191 mg/dL — ABNORMAL HIGH (ref 70–99)
Glucose-Capillary: 251 mg/dL — ABNORMAL HIGH (ref 70–99)

## 2022-01-09 NOTE — Progress Notes (Signed)
[facility]  ROUNDING NOTE   Subjective:   Deborah [name] is a 66 year old female with a past medical history including diabetes, hyperlipidemia, hypertension, CHF, glottic stenosis status post chronic tracheostomy, and end-stage renal disease on dialysis.  Patient presents to the emergency department with complaints of shortness of breath.   Patient seen and evaluated during dialysis   HEMODIALYSIS FLOWSHEET:  Blood Flow Rate (mL/min): 400 mL/min Arterial Pressure (mmHg): -150 mmHg Venous Pressure (mmHg): 160 mmHg Transmembrane Pressure (mmHg): 50 mmHg Ultrafiltration Rate (mL/min): 1160 mL/min Dialysate Flow Rate (mL/min): 500 ml/min Conductivity: Machine : 13.5 Conductivity: Machine : 13.5 Dialysis Fluid Bolus: Normal Saline Bolus Amount (mL): 100 mL (uf turned off due to bp dropping) Dialysate Change: Other (comment) (changed to 3K)  No complaints at this time Tolerating treatment well  Objective:  Vital signs in last 24 hours:  Temp:  [97.6 F (36.4 C)-98.5 F (36.9 C)] 98 F (36.7 C) (02/06 1226) Pulse Rate:  [57-71] 71 (02/06 1329) Resp:  [10-24] 14 (02/06 1226) BP: (68-141)/(47-97) 112/53 (02/06 1226) SpO2:  [95 %-100 %] 100 % (02/06 1329) FiO2 (%):  [28 %] 28 % (02/05 2003) Weight:  [66.1 kg] 66.1 kg (02/06 1226)  Weight change:  Filed Weights   01/07/22 0500 01/08/22 0453 01/09/22 1226  Weight: 70.7 kg 68.9 kg 66.1 kg    Intake/Output: I/O last 3 completed shifts: In: 480 [P.O.:480] Out: -    Intake/Output this shift:  Total I/O In: 120 [P.O.:120] Out: 2210 [Other:2210]  Physical Exam: General: NAD  Head: Normocephalic, atraumatic. Moist oral mucosal membranes  Eyes: Anicteric  Lungs:  Clear to auscultation, normal effort, midline trach  Heart: Regular rate and rhythm  Abdomen:  Soft, nontender  Extremities: No peripheral edema  Neurologic: Nonfocal, moving all four extremities  Skin: No lesions  Access: Left aVF    Basic  Metabolic Panel: Recent Labs  Lab 01/03/22 0020 01/04/22 0236 01/04/22 0238 01/05/22 0515 01/07/22 0554  NA 136  --  133* 138 133*  K 4.9  --  4.3 4.0 4.2  CL 99  --  93* 100 92*  CO2 19*  --  29 29 31   GLUCOSE 128*  --  232* 123* 131*  BUN 169*  --  69* 41* 39*  CREATININE 16.04*  --  8.65* 6.95* 6.55*  CALCIUM 8.4*  --  8.3* 8.4* 8.9  MG  --  1.5*  --  1.7  --   PHOS  --   --   --   --  4.0     Liver Function Tests: Recent Labs  Lab 01/03/22 0020 01/07/22 0554  AST 15  --   ALT 14  --   ALKPHOS 89  --   BILITOT 0.7  --   PROT 7.3  --   ALBUMIN 3.8 3.4*    No results for input(s): LIPASE, AMYLASE in the last 168 hours. No results for input(s): AMMONIA in the last 168 hours.  CBC: Recent Labs  Lab 01/03/22 0020 01/04/22 0238 01/05/22 0515 01/07/22 0554  WBC 7.1 7.8 5.8 7.3  NEUTROABS 4.8 6.8  --   --   HGB 10.4* 10.0* 9.6* 9.7*  HCT 30.7* 29.3* 29.3* 29.2*  MCV 89.5 86.9 91.3 88.8  PLT 162 163 153 180     Cardiac Enzymes: No results for input(s): CKTOTAL, CKMB, CKMBINDEX, TROPONINI in the last 168 hours.  BNP: Invalid input(s): POCBNP  CBG: Recent Labs  Lab 01/07/22 1204 01/08/22 1205 01/08/22 1619  01/08/22 1952 01/09/22 0720  GLUCAP 242* 253* 162* 203* 145*     Microbiology: Results for orders placed or performed during the hospital encounter of 01/03/22  Resp Panel by RT-PCR (Flu A&B, Covid) Nasopharyngeal Swab     Status: None   Collection Time: 01/03/22 12:31 AM   Specimen: Nasopharyngeal Swab; Nasopharyngeal(NP) swabs in vial transport medium  Result Value Ref Range Status   SARS Coronavirus 2 by RT PCR NEGATIVE NEGATIVE Final    Comment: (NOTE) SARS-CoV-2 target nucleic acids are NOT DETECTED.  The SARS-CoV-2 RNA is generally detectable in upper respiratory specimens during the acute phase of infection. The lowest concentration of SARS-CoV-2 viral copies this assay can detect is 138 copies/mL. A negative result does not  preclude SARS-Cov-2 infection and should not be used as the sole basis for treatment or other patient management decisions. A negative result may occur with  improper specimen collection/handling, submission of specimen other than nasopharyngeal swab, presence of viral mutation(s) within the areas targeted by this assay, and inadequate number of viral copies(<138 copies/mL). A negative result must be combined with clinical observations, patient history, and epidemiological information. The expected result is Negative.  Fact Sheet for Patients:  EntrepreneurPulse.com.au  Fact Sheet for Healthcare Providers:  IncredibleEmployment.be  This test is no t yet approved or cleared by the [place] FDA and  has been authorized for detection and/or diagnosis of SARS-CoV-2 by FDA under an Emergency Use Authorization (EUA). This EUA will remain  in effect (meaning this test can be used) for the duration of the COVID-19 declaration under Section 564(b)(1) of the Act, 21 U.S.C.section 360bbb-3(b)(1), unless the authorization is terminated  or revoked sooner.       Influenza A by PCR NEGATIVE NEGATIVE Final   Influenza B by PCR NEGATIVE NEGATIVE Final    Comment: (NOTE) The Xpert Xpress SARS-CoV-2/FLU/RSV plus assay is intended as an aid in the diagnosis of influenza from Nasopharyngeal swab specimens and should not be used as a sole basis for treatment. Nasal washings and aspirates are unacceptable for Xpert Xpress SARS-CoV-2/FLU/RSV testing.  Fact Sheet for Patients: EntrepreneurPulse.com.au  Fact Sheet for Healthcare Providers: IncredibleEmployment.be  This test is not yet approved or cleared by the [place] FDA and has been authorized for detection and/or diagnosis of SARS-CoV-2 by FDA under an Emergency Use Authorization (EUA). This EUA will remain in effect (meaning this test can be used) for the  duration of the COVID-19 declaration under Section 564(b)(1) of the Act, 21 U.S.C. section 360bbb-3(b)(1), unless the authorization is terminated or revoked.  Performed at [facility], [address] [place], [place] [address]     Coagulation Studies: No results for input(s): LABPROT, INR in the last 72 hours.  Urinalysis: No results for input(s): COLORURINE, LABSPEC, PHURINE, GLUCOSEU, HGBUR, BILIRUBINUR, KETONESUR, PROTEINUR, UROBILINOGEN, NITRITE, LEUKOCYTESUR in the last 72 hours.  Invalid input(s): APPERANCEUR    Imaging: No results found.   Medications:      albuterol  2.5 mg Nebulization TID   amLODipine  10 mg Oral Daily   apixaban  2.5 mg Oral BID   aspirin EC  81 mg Oral Daily   atorvastatin  80 mg Oral q1800   carvedilol  25 mg Oral BID   Chlorhexidine Gluconate Cloth  6 each Topical Q0600   escitalopram  10 mg Oral Daily   furosemide  40 mg Oral Once per day on Sun Tue Thu Sat   gabapentin  300 mg Oral QHS  insulin aspart  0-6 Units Subcutaneous TID WC   insulin detemir  12 Units Subcutaneous QHS   irbesartan  150 mg Oral Daily   midodrine  5 mg Oral Once per day on Mon Wed Fri   sevelamer carbonate  1,600 mg Oral TID WC   traZODone  50 mg Oral Once per day on Mon Wed Fri   acetaminophen **OR** acetaminophen, magnesium hydroxide, ondansetron **OR** ondansetron (ZOFRAN) IV, traZODone  Assessment/ Plan:  Deborah [name] is a 66 y.o.  female with a past medical history including diabetes, hyperlipidemia, hypertension, CHF, glottic stenosis status post chronic tracheostomy, and end-stage renal disease on dialysis.  Patient presents to the emergency department with complaints of shortness of breath.  Patient states she has missed the previous 2 dialysis treatments due to home health nurse inability to accompany patient to outpatient treatments.   [facility] [address]/MWF/Lt AVF/ 64kg  Respiratory distress with end-stage renal disease on dialysis.     Receiving dialysis, tolerating well.  UF goal increased from 1 L to 2 to 2.5 L based on standing weight.  As tolerated. next treatment scheduled for Wednesday.  Case management continues to search for new outpatient nursing agency to provide supervision during outpatient dialysis treatments.  Once arranged, patient can be discharged.  2. Anemia of chronic kidney disease Lab Results  Component Value Date   HGB 9.7 (L) 01/07/2022  Hemoglobin at goal  3. Secondary Hyperparathyroidism:  Lab Results  Component Value Date   PTH 169 (H) 11/12/2020   CALCIUM 8.9 01/07/2022   CAION 1.09 (L) 09/01/2016   PHOS 4.0 01/07/2022    Calcium and phosphorus remain within acceptable range.   LOS: 5   2/6/20232:22 PM

## 2022-01-09 NOTE — Progress Notes (Signed)
Hemodialysis patient known at [facility] MWF 10:40am. Patient is normally accompanied by a nurse to treatments due to trach care. Patient stated that of late, the [facility] nurse has been unavailable to go to treatments. Therefore, patient has been missing treatment days. TOC is in process of switching HH. Please contact me with any dialysis placement concerns.

## 2022-01-09 NOTE — Progress Notes (Signed)
Progress Note    Deborah [name]  WER:[id number] DOB: [birth year]  DOA: 01/03/2022 PCP: Leta Speller, MD      Brief Narrative:    Medical records reviewed and are as summarized below:  Deborah [name] is a 66 y.o. female with medical history significant for atrial fibrillation on Eliquis, type II DM, ESRD on hemodialysis on M, W and F, hypertension, dyslipidemia, glottic stenosis s/p tracheostomy, who presented to the hospital because of wheezing, worsening shortness of breath and productive cough after missing 2 outpatient hemodialysis sessions.  Patient has to be accompanied by a nurse to the dialysis unit but apparently the nurses have been calling out and so she had not been able to go to dialysis.  She was diagnosed with acute pulmonary edema/volume overload/acute on chronic diastolic CHF from missed hemodialysis.  She was emergently dialyzed in the emergency room with plan for discharge back home.  However, she became acutely short of breath with increasing oxygen requirement up to 5 L while waiting to be discharged from the emergency room.  She was subsequently admitted to the hospital for further management.         Assessment/Plan:   Principal Problem:   Acute pulmonary edema ([name]) Active Problems:   ESRD (end stage renal disease) ([name])   Acute on chronic diastolic CHF (congestive heart failure) ([name])   Body mass index is 26.65 kg/m.   Acute pulmonary edema/volume overload/acute on chronic diastolic CHF: Continue Lasix  ESRD: She had hemodialysis today.  Follow-up with nephrologist.  Acute on chronic hypoxic respiratory failure: Continue oxygen via trach collar as needed  Paroxysmal atrial fibrillation: Continue carvedilol and Eliquis  Type II DM with hyperglycemia: Continue Levemir and NovoLog.  Other comorbidities include depression, type II DM with peripheral neuropathy, hypertension, dyslipidemia, GERD  Diet Order             Diet Heart Room  service appropriate? Yes; Fluid consistency: Thin  Diet effective now                       Consultants: Nephrologist  Procedures: None    Medications:    albuterol  2.5 mg Nebulization TID   amLODipine  10 mg Oral Daily   apixaban  2.5 mg Oral BID   aspirin EC  81 mg Oral Daily   atorvastatin  80 mg Oral q1800   carvedilol  25 mg Oral BID   Chlorhexidine Gluconate Cloth  6 each Topical Q0600   escitalopram  10 mg Oral Daily   furosemide  40 mg Oral Once per day on Sun Tue Thu Sat   gabapentin  300 mg Oral QHS   insulin aspart  0-6 Units Subcutaneous TID WC   insulin detemir  12 Units Subcutaneous QHS   irbesartan  150 mg Oral Daily   midodrine  5 mg Oral Once per day on Mon Wed Fri   sevelamer carbonate  1,600 mg Oral TID WC   traZODone  50 mg Oral Once per day on Mon Wed Fri   Continuous Infusions:     Anti-infectives (From admission, onward)    None              Family Communication/Anticipated D/C date and plan/Code Status   DVT prophylaxis: apixaban (ELIQUIS) tablet 2.5 mg Start: 01/03/22 2200 apixaban (ELIQUIS) tablet 2.5 mg     Code Status: Full Code  Family Communication: None Disposition Plan: Plan to discharge home when safe  to do so.   Status is: Inpatient Remains inpatient appropriate because: Unsafe discharge.  She needs a nurse specialized in trach care to accompany her to hemodialysis.  Case manager is helping to coordinate this              Subjective:   Interval events noted.  She returned from hemodialysis this afternoon.  She has no complaints.  No shortness of breath or chest pain.  Harrell Gave, RN, was at the bedside  Objective:    Vitals:   01/09/22 1215 01/09/22 1226 01/09/22 1329 01/09/22 1527  BP: (!) 101/56 (!) 112/53  131/62  Pulse: 64 65 71 72  Resp: 15 14  16   Temp:  98 F (36.7 C)  98.4 F (36.9 C)  TempSrc:  Oral  Oral  SpO2: 100% 100% 100% 100%  Weight:  66.1 kg    Height:       No  data found.   Intake/Output Summary (Last 24 hours) at 01/09/2022 1604 Last data filed at 01/09/2022 1226 Gross per 24 hour  Intake 120 ml  Output 2210 ml  Net -2090 ml    Filed Weights   01/07/22 0500 01/08/22 0453 01/09/22 1226  Weight: 70.7 kg 68.9 kg 66.1 kg    Exam:  GEN: NAD SKIN: No rash EYES: EOMI ENT: MMM, +trach CV: RRR PULM: CTA B ABD: soft, ND, NT, +BS CNS: AAO x 3, non focal EXT: No edema or tenderness             Data Reviewed:   I have personally reviewed following labs and imaging studies:  Labs: Labs show the following:   Basic Metabolic Panel: Recent Labs  Lab 01/03/22 0020 01/04/22 0236 01/04/22 0238 01/05/22 0515 01/07/22 0554  NA 136  --  133* 138 133*  K 4.9  --  4.3 4.0 4.2  CL 99  --  93* 100 92*  CO2 19*  --  29 29 31   GLUCOSE 128*  --  232* 123* 131*  BUN 169*  --  69* 41* 39*  CREATININE 16.04*  --  8.65* 6.95* 6.55*  CALCIUM 8.4*  --  8.3* 8.4* 8.9  MG  --  1.5*  --  1.7  --   PHOS  --   --   --   --  4.0   GFR Estimated Creatinine Clearance: 7.7 mL/min (A) (by C-G formula based on SCr of 6.55 mg/dL (H)). Liver Function Tests: Recent Labs  Lab 01/03/22 0020 01/07/22 0554  AST 15  --   ALT 14  --   ALKPHOS 89  --   BILITOT 0.7  --   PROT 7.3  --   ALBUMIN 3.8 3.4*   No results for input(s): LIPASE, AMYLASE in the last 168 hours. No results for input(s): AMMONIA in the last 168 hours. Coagulation profile No results for input(s): INR, PROTIME in the last 168 hours.  CBC: Recent Labs  Lab 01/03/22 0020 01/04/22 0238 01/05/22 0515 01/07/22 0554  WBC 7.1 7.8 5.8 7.3  NEUTROABS 4.8 6.8  --   --   HGB 10.4* 10.0* 9.6* 9.7*  HCT 30.7* 29.3* 29.3* 29.2*  MCV 89.5 86.9 91.3 88.8  PLT 162 163 153 180   Cardiac Enzymes: No results for input(s): CKTOTAL, CKMB, CKMBINDEX, TROPONINI in the last 168 hours. BNP (last 3 results) No results for input(s): PROBNP in the last 8760 hours. CBG: Recent Labs  Lab  01/08/22 1205 01/08/22 1619 01/08/22 1952 01/09/22 0720 01/09/22  1542  GLUCAP 253* 162* 203* 145* 191*   D-Dimer: No results for input(s): DDIMER in the last 72 hours. Hgb A1c: No results for input(s): HGBA1C in the last 72 hours. Lipid Profile: No results for input(s): CHOL, HDL, LDLCALC, TRIG, CHOLHDL, LDLDIRECT in the last 72 hours. Thyroid function studies: No results for input(s): TSH, T4TOTAL, T3FREE, THYROIDAB in the last 72 hours.  Invalid input(s): FREET3 Anemia work up: No results for input(s): VITAMINB12, FOLATE, FERRITIN, TIBC, IRON, RETICCTPCT in the last 72 hours. Sepsis Labs: Recent Labs  Lab 01/03/22 0020 01/04/22 0238 01/05/22 0515 01/07/22 0554  WBC 7.1 7.8 5.8 7.3    Microbiology Recent Results (from the past 240 hour(s))  Resp Panel by RT-PCR (Flu A&B, Covid) Nasopharyngeal Swab     Status: None   Collection Time: 01/03/22 12:31 AM   Specimen: Nasopharyngeal Swab; Nasopharyngeal(NP) swabs in vial transport medium  Result Value Ref Range Status   SARS Coronavirus 2 by RT PCR NEGATIVE NEGATIVE Final    Comment: (NOTE) SARS-CoV-2 target nucleic acids are NOT DETECTED.  The SARS-CoV-2 RNA is generally detectable in upper respiratory specimens during the acute phase of infection. The lowest concentration of SARS-CoV-2 viral copies this assay can detect is 138 copies/mL. A negative result does not preclude SARS-Cov-2 infection and should not be used as the sole basis for treatment or other patient management decisions. A negative result may occur with  improper specimen collection/handling, submission of specimen other than nasopharyngeal swab, presence of viral mutation(s) within the areas targeted by this assay, and inadequate number of viral copies(<138 copies/mL). A negative result must be combined with clinical observations, patient history, and epidemiological information. The expected result is Negative.  Fact Sheet for Patients:   EntrepreneurPulse.com.au  Fact Sheet for Healthcare Providers:  IncredibleEmployment.be  This test is no t yet approved or cleared by the [place] FDA and  has been authorized for detection and/or diagnosis of SARS-CoV-2 by FDA under an Emergency Use Authorization (EUA). This EUA will remain  in effect (meaning this test can be used) for the duration of the COVID-19 declaration under Section 564(b)(1) of the Act, 21 U.S.C.section 360bbb-3(b)(1), unless the authorization is terminated  or revoked sooner.       Influenza A by PCR NEGATIVE NEGATIVE Final   Influenza B by PCR NEGATIVE NEGATIVE Final    Comment: (NOTE) The Xpert Xpress SARS-CoV-2/FLU/RSV plus assay is intended as an aid in the diagnosis of influenza from Nasopharyngeal swab specimens and should not be used as a sole basis for treatment. Nasal washings and aspirates are unacceptable for Xpert Xpress SARS-CoV-2/FLU/RSV testing.  Fact Sheet for Patients: EntrepreneurPulse.com.au  Fact Sheet for Healthcare Providers: IncredibleEmployment.be  This test is not yet approved or cleared by the [place] FDA and has been authorized for detection and/or diagnosis of SARS-CoV-2 by FDA under an Emergency Use Authorization (EUA). This EUA will remain in effect (meaning this test can be used) for the duration of the COVID-19 declaration under Section 564(b)(1) of the Act, 21 U.S.C. section 360bbb-3(b)(1), unless the authorization is terminated or revoked.  Performed at [facility], [address] [place], [place] [address]     Procedures and diagnostic studies:  No results found.             LOS: 5 days   [place]  [facility]   Pager on [email or web address]. If 7PM-7AM, please contact night-coverage at [email or web address]     01/09/2022, 4:04 PM

## 2022-01-10 DIAGNOSIS — N186 End stage renal disease: Secondary | ICD-10-CM | POA: Diagnosis not present

## 2022-01-10 DIAGNOSIS — J810 Acute pulmonary edema: Secondary | ICD-10-CM | POA: Diagnosis not present

## 2022-01-10 DIAGNOSIS — I5033 Acute on chronic diastolic (congestive) heart failure: Secondary | ICD-10-CM | POA: Diagnosis not present

## 2022-01-10 LAB — GLUCOSE, CAPILLARY
Glucose-Capillary: 118 mg/dL — ABNORMAL HIGH (ref 70–99)
Glucose-Capillary: 158 mg/dL — ABNORMAL HIGH (ref 70–99)
Glucose-Capillary: 184 mg/dL — ABNORMAL HIGH (ref 70–99)
Glucose-Capillary: 208 mg/dL — ABNORMAL HIGH (ref 70–99)

## 2022-01-10 NOTE — TOC Progression Note (Signed)
Transition of Care [facility] - Progression Note    Patient Details  Name: Deborah [name] MRN: [id number] Date of Birth: May 19, 1956  Transition of Care [facility]) CM/SW [place], RN Phone Number: 01/10/2022, 5:03 PM  Clinical Narrative:    Outreach to Peter Congo @ [facility] services [phone] requesting availability to assist patient with PCS services during dialysis. Peter Congo reports she will outreach to her office manager and requested TOC contact her again tomorrow for outcome of discussion.         Expected Discharge Plan and Services                                                 Social Determinants of Health (SDOH) Interventions    Readmission Risk Interventions Readmission Risk Prevention Plan 11/04/2020 04/02/2020 10/18/2019  Transportation Screening Complete Complete Complete  Medication Review Press photographer) (No Data) Complete -  PCP or Specialist appointment within 3-5 days of discharge Complete Complete Complete  HRI or Home Care Consult (No Data) Complete -  SW Recovery Care/Counseling Consult Complete - -  Palliative Care Screening Not Applicable - -  [place] Not Applicable Complete Not Applicable  Some recent data might be hidden

## 2022-01-10 NOTE — Progress Notes (Signed)
Progress Note    Deborah [name]  QIW:[id number] DOB: [birth year]  DOA: 01/03/2022 PCP: Leta Speller, MD      Brief Narrative:    Medical records reviewed and are as summarized below:  Deborah [name] is a 66 y.o. female with medical history significant for atrial fibrillation on Eliquis, type II DM, ESRD on hemodialysis on M, W and F, hypertension, dyslipidemia, glottic stenosis s/p tracheostomy, who presented to the hospital because of wheezing, worsening shortness of breath and productive cough after missing 2 outpatient hemodialysis sessions.  Patient has to be accompanied by a nurse to the dialysis unit but apparently the nurses have been calling out and so she had not been able to go to dialysis.  She was diagnosed with acute pulmonary edema/volume overload/acute on chronic diastolic CHF from missed hemodialysis.  She was emergently dialyzed in the emergency room with plan for discharge back home.  However, she became acutely short of breath with increasing oxygen requirement up to 5 L while waiting to be discharged from the emergency room.  She was subsequently admitted to the hospital for further management.         Assessment/Plan:   Principal Problem:   Acute pulmonary edema ([name]) Active Problems:   ESRD (end stage renal disease) ([name])   Acute on chronic diastolic CHF (congestive heart failure) ([name])   Body mass index is 26.65 kg/m.   Acute pulmonary edema/volume overload/acute on chronic diastolic CHF: Continue Lasix  ESRD: Plan for hemodialysis tomorrow.  Follow-up with nephrologist.  Acute on chronic hypoxic respiratory failure: She is back on 5 L/min oxygen but I think she can be weaned down to 3 L/min which is her baseline.  Paroxysmal atrial fibrillation: Continue carvedilol and Eliquis  Type II DM with hyperglycemia: Continue Levemir and NovoLog.  Other comorbidities include depression, type II DM with peripheral neuropathy, hypertension,  dyslipidemia, GERD  Diet Order             Diet Heart Room service appropriate? Yes; Fluid consistency: Thin  Diet effective now                       Consultants: Nephrologist  Procedures: None    Medications:    albuterol  2.5 mg Nebulization TID   amLODipine  10 mg Oral Daily   apixaban  2.5 mg Oral BID   aspirin EC  81 mg Oral Daily   atorvastatin  80 mg Oral q1800   carvedilol  25 mg Oral BID   Chlorhexidine Gluconate Cloth  6 each Topical Q0600   escitalopram  10 mg Oral Daily   furosemide  40 mg Oral Once per day on Sun Tue Thu Sat   gabapentin  300 mg Oral QHS   insulin aspart  0-6 Units Subcutaneous TID WC   insulin detemir  12 Units Subcutaneous QHS   irbesartan  150 mg Oral Daily   midodrine  5 mg Oral Once per day on Mon Wed Fri   sevelamer carbonate  1,600 mg Oral TID WC   traZODone  50 mg Oral Once per day on Mon Wed Fri   Continuous Infusions:     Anti-infectives (From admission, onward)    None              Family Communication/Anticipated D/C date and plan/Code Status   DVT prophylaxis: apixaban (ELIQUIS) tablet 2.5 mg Start: 01/03/22 2200 apixaban (ELIQUIS) tablet 2.5 mg     Code  Status: Full Code  Family Communication: Plan discussed with [name], son, at the bedside Disposition Plan: Plan to discharge home when safe to do so.   Status is: Inpatient Remains inpatient appropriate because: Unsafe discharge.  She needs a nurse specialized in trach care to accompany her to hemodialysis.  Case manager is helping to coordinate this              Subjective:   No complaints.  No shortness of breath or chest pain.  [name], her son, was at the bedside  Objective:    Vitals:   01/10/22 0715 01/10/22 0749 01/10/22 1135 01/10/22 1336  BP:  118/63 (!) 156/77   Pulse: 64 65 69 89  Resp: 16 16 16 18   Temp:  98.4 F (36.9 C) 98.4 F (36.9 C)   TempSrc:  Oral Oral   SpO2: 98% 98% 100% 96%  Weight:      Height:        No data found.   Intake/Output Summary (Last 24 hours) at 01/10/2022 1614 Last data filed at 01/10/2022 1300 Gross per 24 hour  Intake 480 ml  Output --  Net 480 ml    Filed Weights   01/07/22 0500 01/08/22 0453 01/09/22 1226  Weight: 70.7 kg 68.9 kg 66.1 kg    Exam:  GEN: NAD SKIN: No rash EYES: EOMI ENT: MMM, +trach CV: RRR PULM: CTA B ABD: soft, ND, NT, +BS CNS: AAO x 3, non focal EXT: No edema or tenderness            Data Reviewed:   I have personally reviewed following labs and imaging studies:  Labs: Labs show the following:   Basic Metabolic Panel: Recent Labs  Lab 01/04/22 0236 01/04/22 0238 01/04/22 0238 01/05/22 0515 01/07/22 0554  NA  --  133*  --  138 133*  K  --  4.3   < > 4.0 4.2  CL  --  93*  --  100 92*  CO2  --  29  --  29 31  GLUCOSE  --  232*  --  123* 131*  BUN  --  69*  --  41* 39*  CREATININE  --  8.65*  --  6.95* 6.55*  CALCIUM  --  8.3*  --  8.4* 8.9  MG 1.5*  --   --  1.7  --   PHOS  --   --   --   --  4.0   < > = values in this interval not displayed.   GFR Estimated Creatinine Clearance: 7.7 mL/min (A) (by C-G formula based on SCr of 6.55 mg/dL (H)). Liver Function Tests: Recent Labs  Lab 01/07/22 0554  ALBUMIN 3.4*   No results for input(s): LIPASE, AMYLASE in the last 168 hours. No results for input(s): AMMONIA in the last 168 hours. Coagulation profile No results for input(s): INR, PROTIME in the last 168 hours.  CBC: Recent Labs  Lab 01/04/22 0238 01/05/22 0515 01/07/22 0554  WBC 7.8 5.8 7.3  NEUTROABS 6.8  --   --   HGB 10.0* 9.6* 9.7*  HCT 29.3* 29.3* 29.2*  MCV 86.9 91.3 88.8  PLT 163 153 180   Cardiac Enzymes: No results for input(s): CKTOTAL, CKMB, CKMBINDEX, TROPONINI in the last 168 hours. BNP (last 3 results) No results for input(s): PROBNP in the last 8760 hours. CBG: Recent Labs  Lab 01/09/22 0720 01/09/22 1542 01/09/22 2052 01/10/22 0752 01/10/22 1211  GLUCAP 145* 191*  251* 118*  158*   D-Dimer: No results for input(s): DDIMER in the last 72 hours. Hgb A1c: No results for input(s): HGBA1C in the last 72 hours. Lipid Profile: No results for input(s): CHOL, HDL, LDLCALC, TRIG, CHOLHDL, LDLDIRECT in the last 72 hours. Thyroid function studies: No results for input(s): TSH, T4TOTAL, T3FREE, THYROIDAB in the last 72 hours.  Invalid input(s): FREET3 Anemia work up: No results for input(s): VITAMINB12, FOLATE, FERRITIN, TIBC, IRON, RETICCTPCT in the last 72 hours. Sepsis Labs: Recent Labs  Lab 01/04/22 0238 01/05/22 0515 01/07/22 0554  WBC 7.8 5.8 7.3    Microbiology Recent Results (from the past 240 hour(s))  Resp Panel by RT-PCR (Flu A&B, Covid) Nasopharyngeal Swab     Status: None   Collection Time: 01/03/22 12:31 AM   Specimen: Nasopharyngeal Swab; Nasopharyngeal(NP) swabs in vial transport medium  Result Value Ref Range Status   SARS Coronavirus 2 by RT PCR NEGATIVE NEGATIVE Final    Comment: (NOTE) SARS-CoV-2 target nucleic acids are NOT DETECTED.  The SARS-CoV-2 RNA is generally detectable in upper respiratory specimens during the acute phase of infection. The lowest concentration of SARS-CoV-2 viral copies this assay can detect is 138 copies/mL. A negative result does not preclude SARS-Cov-2 infection and should not be used as the sole basis for treatment or other patient management decisions. A negative result may occur with  improper specimen collection/handling, submission of specimen other than nasopharyngeal swab, presence of viral mutation(s) within the areas targeted by this assay, and inadequate number of viral copies(<138 copies/mL). A negative result must be combined with clinical observations, patient history, and epidemiological information. The expected result is Negative.  Fact Sheet for Patients:  EntrepreneurPulse.com.au  Fact Sheet for Healthcare Providers:   IncredibleEmployment.be  This test is no t yet approved or cleared by the [place] FDA and  has been authorized for detection and/or diagnosis of SARS-CoV-2 by FDA under an Emergency Use Authorization (EUA). This EUA will remain  in effect (meaning this test can be used) for the duration of the COVID-19 declaration under Section 564(b)(1) of the Act, 21 U.S.C.section 360bbb-3(b)(1), unless the authorization is terminated  or revoked sooner.       Influenza A by PCR NEGATIVE NEGATIVE Final   Influenza B by PCR NEGATIVE NEGATIVE Final    Comment: (NOTE) The Xpert Xpress SARS-CoV-2/FLU/RSV plus assay is intended as an aid in the diagnosis of influenza from Nasopharyngeal swab specimens and should not be used as a sole basis for treatment. Nasal washings and aspirates are unacceptable for Xpert Xpress SARS-CoV-2/FLU/RSV testing.  Fact Sheet for Patients: EntrepreneurPulse.com.au  Fact Sheet for Healthcare Providers: IncredibleEmployment.be  This test is not yet approved or cleared by the [place] FDA and has been authorized for detection and/or diagnosis of SARS-CoV-2 by FDA under an Emergency Use Authorization (EUA). This EUA will remain in effect (meaning this test can be used) for the duration of the COVID-19 declaration under Section 564(b)(1) of the Act, 21 U.S.C. section 360bbb-3(b)(1), unless the authorization is terminated or revoked.  Performed at [facility], [address] [place], [place] [address]     Procedures and diagnostic studies:  No results found.             LOS: 6 days   Shahara Hartsfield  [facility]   Pager on [email or web address]. If 7PM-7AM, please contact night-coverage at [email or web address]     01/10/2022, 4:14 PM

## 2022-01-10 NOTE — TOC Progression Note (Signed)
Transition of Care [facility]) - Progression Note    Patient Details  Name: CLARECE DRZEWIECKI MRN: [id number] Date of Birth: [birth year]  Transition of Care [facility]) CM/SW [place], RN Phone Number: 01/10/2022, 11:18 AM  Clinical Narrative:   Marye Round at [facility] [phone])  states her Clinical Patent attorney are on an 'Incentive Trip' and will not be able to provide information this week.  [place] states the office was not clear on what was needed and they were not going to reach out until next week to clarify.  They state that it is "not their issue" if the patient's hospital stay is extended and they are not able to accommodate at this time.    TOC supervisor informed of issue.         Expected Discharge Plan and Services                                                 Social Determinants of Health (SDOH) Interventions    Readmission Risk Interventions Readmission Risk Prevention Plan 11/04/2020 04/02/2020 10/18/2019  Transportation Screening Complete Complete Complete  Medication Review Press photographer) (No Data) Complete -  PCP or Specialist appointment within 3-5 days of discharge Complete Complete Complete  HRI or Home Care Consult (No Data) Complete -  SW Recovery Care/Counseling Consult Complete - -  Palliative Care Screening Not Applicable - -  [place] Not Applicable Complete Not Applicable  Some recent data might be hidden

## 2022-01-10 NOTE — Progress Notes (Signed)
[facility]  ROUNDING NOTE   Subjective:   Deborah [name] is a 66 year old female with a past medical history including diabetes, hyperlipidemia, hypertension, CHF, glottic stenosis status post chronic tracheostomy, and end-stage renal disease on dialysis.  Patient presents to the emergency department with complaints of shortness of breath.   Patient sitting up in bed Alert and oriented Tolerating meals Denies shortness of breath at rest, improved with activity  Objective:  Vital signs in last 24 hours:  Temp:  [97.1 F (36.2 C)-98.6 F (37 C)] 98.4 F (36.9 C) (02/07 0749) Pulse Rate:  [57-72] 65 (02/07 0749) Resp:  [13-18] 16 (02/07 0749) BP: (68-131)/(47-65) 118/63 (02/07 0749) SpO2:  [95 %-100 %] 98 % (02/07 0749) FiO2 (%):  [28 %] 28 % (02/07 0715) Weight:  [66.1 kg] 66.1 kg (02/06 1226)  Weight change:  Filed Weights   01/07/22 0500 01/08/22 0453 01/09/22 1226  Weight: 70.7 kg 68.9 kg 66.1 kg    Intake/Output: I/O last 3 completed shifts: In: 120 [P.O.:120] Out: 2210 [Other:2210]   Intake/Output this shift:  Total I/O In: 240 [P.O.:240] Out: -   Physical Exam: General: NAD  Head: Normocephalic, atraumatic. Moist oral mucosal membranes  Eyes: Anicteric  Lungs:  Clear to auscultation, normal effort, midline trach  Heart: Regular rate and rhythm  Abdomen:  Soft, nontender  Extremities: No peripheral edema  Neurologic: Nonfocal, moving all four extremities  Skin: No lesions  Access: Left aVF    Basic Metabolic Panel: Recent Labs  Lab 01/04/22 0236 01/04/22 0238 01/05/22 0515 01/07/22 0554  NA  --  133* 138 133*  K  --  4.3 4.0 4.2  CL  --  93* 100 92*  CO2  --  29 29 31   GLUCOSE  --  232* 123* 131*  BUN  --  69* 41* 39*  CREATININE  --  8.65* 6.95* 6.55*  CALCIUM  --  8.3* 8.4* 8.9  MG 1.5*  --  1.7  --   PHOS  --   --   --  4.0     Liver Function Tests: Recent Labs  Lab 01/07/22 0554  ALBUMIN 3.4*    No results for  input(s): LIPASE, AMYLASE in the last 168 hours. No results for input(s): AMMONIA in the last 168 hours.  CBC: Recent Labs  Lab 01/04/22 0238 01/05/22 0515 01/07/22 0554  WBC 7.8 5.8 7.3  NEUTROABS 6.8  --   --   HGB 10.0* 9.6* 9.7*  HCT 29.3* 29.3* 29.2*  MCV 86.9 91.3 88.8  PLT 163 153 180     Cardiac Enzymes: No results for input(s): CKTOTAL, CKMB, CKMBINDEX, TROPONINI in the last 168 hours.  BNP: Invalid input(s): POCBNP  CBG: Recent Labs  Lab 01/08/22 1952 01/09/22 0720 01/09/22 1542 01/09/22 2052 01/10/22 0752  GLUCAP 203* 145* 191* 251* 118*     Microbiology: Results for orders placed or performed during the hospital encounter of 01/03/22  Resp Panel by RT-PCR (Flu A&B, Covid) Nasopharyngeal Swab     Status: None   Collection Time: 01/03/22 12:31 AM   Specimen: Nasopharyngeal Swab; Nasopharyngeal(NP) swabs in vial transport medium  Result Value Ref Range Status   SARS Coronavirus 2 by RT PCR NEGATIVE NEGATIVE Final    Comment: (NOTE) SARS-CoV-2 target nucleic acids are NOT DETECTED.  The SARS-CoV-2 RNA is generally detectable in upper respiratory specimens during the acute phase of infection. The lowest concentration of SARS-CoV-2 viral copies this assay can detect is 138 copies/mL.  A negative result does not preclude SARS-Cov-2 infection and should not be used as the sole basis for treatment or other patient management decisions. A negative result may occur with  improper specimen collection/handling, submission of specimen other than nasopharyngeal swab, presence of viral mutation(s) within the areas targeted by this assay, and inadequate number of viral copies(<138 copies/mL). A negative result must be combined with clinical observations, patient history, and epidemiological information. The expected result is Negative.  Fact Sheet for Patients:  EntrepreneurPulse.com.au  Fact Sheet for Healthcare Providers:   IncredibleEmployment.be  This test is no t yet approved or cleared by the [place] FDA and  has been authorized for detection and/or diagnosis of SARS-CoV-2 by FDA under an Emergency Use Authorization (EUA). This EUA will remain  in effect (meaning this test can be used) for the duration of the COVID-19 declaration under Section 564(b)(1) of the Act, 21 U.S.C.section 360bbb-3(b)(1), unless the authorization is terminated  or revoked sooner.       Influenza A by PCR NEGATIVE NEGATIVE Final   Influenza B by PCR NEGATIVE NEGATIVE Final    Comment: (NOTE) The Xpert Xpress SARS-CoV-2/FLU/RSV plus assay is intended as an aid in the diagnosis of influenza from Nasopharyngeal swab specimens and should not be used as a sole basis for treatment. Nasal washings and aspirates are unacceptable for Xpert Xpress SARS-CoV-2/FLU/RSV testing.  Fact Sheet for Patients: EntrepreneurPulse.com.au  Fact Sheet for Healthcare Providers: IncredibleEmployment.be  This test is not yet approved or cleared by the [place] FDA and has been authorized for detection and/or diagnosis of SARS-CoV-2 by FDA under an Emergency Use Authorization (EUA). This EUA will remain in effect (meaning this test can be used) for the duration of the COVID-19 declaration under Section 564(b)(1) of the Act, 21 U.S.C. section 360bbb-3(b)(1), unless the authorization is terminated or revoked.  Performed at [facility], [address] [place], [place] [address]     Coagulation Studies: No results for input(s): LABPROT, INR in the last 72 hours.  Urinalysis: No results for input(s): COLORURINE, LABSPEC, PHURINE, GLUCOSEU, HGBUR, BILIRUBINUR, KETONESUR, PROTEINUR, UROBILINOGEN, NITRITE, LEUKOCYTESUR in the last 72 hours.  Invalid input(s): APPERANCEUR    Imaging: No results found.   Medications:      albuterol  2.5 mg Nebulization TID    amLODipine  10 mg Oral Daily   apixaban  2.5 mg Oral BID   aspirin EC  81 mg Oral Daily   atorvastatin  80 mg Oral q1800   carvedilol  25 mg Oral BID   Chlorhexidine Gluconate Cloth  6 each Topical Q0600   escitalopram  10 mg Oral Daily   furosemide  40 mg Oral Once per day on Sun Tue Thu Sat   gabapentin  300 mg Oral QHS   insulin aspart  0-6 Units Subcutaneous TID WC   insulin detemir  12 Units Subcutaneous QHS   irbesartan  150 mg Oral Daily   midodrine  5 mg Oral Once per day on Mon Wed Fri   sevelamer carbonate  1,600 mg Oral TID WC   traZODone  50 mg Oral Once per day on Mon Wed Fri   acetaminophen **OR** acetaminophen, magnesium hydroxide, ondansetron **OR** ondansetron (ZOFRAN) IV, traZODone  Assessment/ Plan:  Deborah [name] is a 66 y.o.  female with a past medical history including diabetes, hyperlipidemia, hypertension, CHF, glottic stenosis status post chronic tracheostomy, and end-stage renal disease on dialysis.  Patient presents to the emergency department with complaints of  shortness of breath.  Patient states she has missed the previous 2 dialysis treatments due to home health nurse inability to accompany patient to outpatient treatments.   [facility] [address]/MWF/Lt AVF/ 64kg  Respiratory distress with end-stage renal disease on dialysis.    Received dialysis yesterday with UF 2L achieved. Next treatment scheduled for Wednesday.  Case management currently seeking outpatient agency to accompany patient to dialysis treatments. Once this is arranged, patient is cleared for discharge.   2. Anemia of chronic kidney disease Lab Results  Component Value Date   HGB 9.7 (L) 01/07/2022  Will continue to monitor for need of ESA's  3. Secondary Hyperparathyroidism:  Lab Results  Component Value Date   PTH 169 (H) 11/12/2020   CALCIUM 8.9 01/07/2022   CAION 1.09 (L) 09/01/2016   PHOS 4.0 01/07/2022    Calcium and phosphorus remain within acceptable range. Continue  sevelamer with meals   LOS: 6   2/7/202311:09 AM

## 2022-01-10 NOTE — TOC Progression Note (Addendum)
Transition of Care [facility]) - Progression Note    Patient Details  Name: MIKELLE MYRICK MRN: [id number] Date of Birth: 07/30/1956  Transition of Care [facility]) CM/SW [place], RN Phone Number: 01/10/2022, 4:10 PM  Clinical Narrative:   RNCM contacted [facility].  As per [facility], they state that they are not able to cover patient 100% of the time, and family is made aware of this prior to accepting services.  [name] states that a family member should be trained for the service in the event [name] is not able to accommodate.  They also state that the family asked for a nurse not to return who was able to provide services.  RNCM spoke to patient and son.  They state son is able to assist with patient's trach, however dialysis will not permit family to accompany patient during her dialysis, so they are completely dependent on nursing.  Son states they did not want the nurse from [facility] to return because she frequently calls out and is not dependable.    RNCM explained that [facility] is not able to accommodate for 1-2 weeks if they accept patient as per [place].  Estill Bamberg at Dialysis made aware of this.  Son and patient would like RNCM to reach out to more agencies for possibilities.  RNCM will reach out, as [facility] has already declined, and let patient know if there are any other agencies.   Addendum 1700:  [facility] could not accept, [facility] could not accept.  [facility] cannot accommodate  Requests out to [facility] manager aware of situation and is reaching out to providers as well.         Expected Discharge Plan and Services                                                 Social Determinants of Health (SDOH) Interventions    Readmission Risk Interventions Readmission Risk Prevention Plan 11/04/2020 04/02/2020 10/18/2019  Transportation Screening Complete Complete Complete  Medication Review Press photographer) (No Data) Complete -  PCP or Specialist appointment within  3-5 days of discharge Complete Complete Complete  HRI or Home Care Consult (No Data) Complete -  SW Recovery Care/Counseling Consult Complete - -  Palliative Care Screening Not Applicable - -  [place] Not Applicable Complete Not Applicable  Some recent data might be hidden

## 2022-01-10 NOTE — Progress Notes (Signed)
SLP Cancellation Note  Patient Details Name: Deborah [name] MRN: [id number] DOB: 03-Aug-1956   Cancelled treatment:       Reason Eval/Treat Not Completed: SLP screened, no needs identified, will sign off (chart reviewed; consulted NSG then met w/ pt/Son) Pt is a 66 y.o. female with history of medical issues including end-stage renal disease on hemodialysis Monday, Wednesday and Friday, CHF, and Glottic Stenosis s/p Chronic Tracheostomy(2017) who admitted w/ SOB. Patient states she has missed the previous 2 dialysis treatments due to home health nurse inability to accompany patient to outpatient treatments. Per MD dx, Respiratory distress with end-stage renal disease on dialysis. Due to the Glottic Stenosis, pt is unable to wear a PMV. She attempted to use a PMV post tracheostomy, but was unsuccessful d/t inability to move air superiorly, breath-stacking. This continues to be the issue now, per pt. Pt uses finger occlusion during any conversation. She eats/drinks w/out occluding her trach. She has Not had any issues swallowing; no pneumonia in past 6 months, per her report.  No skilled ST services for PMV eval/tx indicated as pt states she is unable to wear a PMV at baseline d/t chronic tracheal stenosis. NSG updated. Pt and Son agreed.      Orinda Kenner, MS, CCC-SLP Speech Language Pathologist Rehab Services; [place] [phone] (ascom) Taygen Newsome 01/10/2022, 4:56 PM

## 2022-01-11 DIAGNOSIS — J810 Acute pulmonary edema: Secondary | ICD-10-CM | POA: Diagnosis not present

## 2022-01-11 LAB — GLUCOSE, CAPILLARY
Glucose-Capillary: 120 mg/dL — ABNORMAL HIGH (ref 70–99)
Glucose-Capillary: 91 mg/dL (ref 70–99)
Glucose-Capillary: 130 mg/dL — ABNORMAL HIGH (ref 70–99)
Glucose-Capillary: 154 mg/dL — ABNORMAL HIGH (ref 70–99)
Glucose-Capillary: 187 mg/dL — ABNORMAL HIGH (ref 70–99)

## 2022-01-11 NOTE — Progress Notes (Signed)
Patient completes scheduled 3-hour treatment without incident, venous and arterial pressures elevated, but within safe parameters for treatment, targeted UF met. No labs nor med's given this treatment. Patient returned to assigned room.

## 2022-01-11 NOTE — Assessment & Plan Note (Signed)
Continue amlodipine, Coreg, Lasix, irbesartan

## 2022-01-11 NOTE — Assessment & Plan Note (Signed)
Heart rates overall controlled.  Continue Eliquis and Coreg

## 2022-01-11 NOTE — TOC Progression Note (Signed)
Transition of Care [facility]) - Progression Note    Patient Details  Name: Deborah [name] MRN: [id number] Date of Birth: 12-07-1955  Transition of Care [facility]) CM/SW Contact  Anselm Pancoast, RN Phone Number: 01/11/2022, 4:22 PM  Clinical Narrative:    Call to [facility] [phone] and spoke to Makiyah seeking clarification on what discipline is required to attend HD with patient in order to assist family with setting up services. Taffy reports her understanding is RT, RN or LPN but would need to call back tomorrow and speak with Dot Been for confirmation.         Expected Discharge Plan and Services                                                 Social Determinants of Health (SDOH) Interventions    Readmission Risk Interventions Readmission Risk Prevention Plan 11/04/2020 04/02/2020 10/18/2019  Transportation Screening Complete Complete Complete  Medication Review Press photographer) (No Data) Complete -  PCP or Specialist appointment within 3-5 days of discharge Complete Complete Complete  HRI or Home Care Consult (No Data) Complete -  SW Recovery Care/Counseling Consult Complete - -  Palliative Care Screening Not Applicable - -  [place] Not Applicable Complete Not Applicable  Some recent data might be hidden

## 2022-01-11 NOTE — Assessment & Plan Note (Signed)
Hemoglobin stable 

## 2022-01-11 NOTE — Assessment & Plan Note (Signed)
At baseline, patient requires 3 L/min supplemental O2.  Currently with trach collar at 5 L/min, on room air at times with charted O2 sats 87 to 95%

## 2022-01-11 NOTE — Care Management Important Message (Signed)
Important Message  Patient Details  Name: Deborah [name] MRN: [id number] Date of Birth: 1956-01-15   Medicare Important Message Given:  Yes     Dannette Barbara 01/11/2022, 12:40 PM

## 2022-01-11 NOTE — Assessment & Plan Note (Signed)
-  Continue Lipitor °

## 2022-01-11 NOTE — Progress Notes (Signed)
[facility]  ROUNDING NOTE   Subjective:   Deborah [name] is a 66 year old female with a past medical history including diabetes, hyperlipidemia, hypertension, CHF, glottic stenosis status post chronic tracheostomy, and end-stage renal disease on dialysis.  Patient presents to the emergency department with complaints of shortness of breath.   Patient seen and evaluated during dialysis   HEMODIALYSIS FLOWSHEET:  Blood Flow Rate (mL/min): 400 mL/min Arterial Pressure (mmHg): -180 mmHg Venous Pressure (mmHg): 180 mmHg Transmembrane Pressure (mmHg): 50 mmHg Ultrafiltration Rate (mL/min): 170 mL/min Dialysate Flow Rate (mL/min): 500 ml/min Conductivity: Machine : 13.7 Conductivity: Machine : 13.7 Dialysis Fluid Bolus: Normal Saline Bolus Amount (mL): 250 mL Dialysate Change: Other (comment) (changed to 3K)  No complaints at this time    Objective:  Vital signs in last 24 hours:  Temp:  [97.7 F (36.5 C)-98.8 F (37.1 C)] 98 F (36.7 C) (02/08 1305) Pulse Rate:  [59-89] 87 (02/08 1305) Resp:  [12-21] 17 (02/08 1305) BP: (83-156)/(58-87) 140/69 (02/08 1305) SpO2:  [86 %-98 %] 94 % (02/08 1305) FiO2 (%):  [21 %-28 %] 28 % (02/08 0719) Weight:  [67.6 kg] 67.6 kg (02/08 1131)  Weight change:  Filed Weights   01/08/22 0453 01/09/22 1226 01/11/22 1131  Weight: 68.9 kg 66.1 kg 67.6 kg    Intake/Output: I/O last 3 completed shifts: In: 720 [P.O.:720] Out: -    Intake/Output this shift:  No intake/output data recorded.  Physical Exam: General: NAD  Head: Normocephalic, atraumatic. Moist oral mucosal membranes  Eyes: Anicteric  Lungs:  Clear to auscultation, normal effort, trach  Heart: Regular rate and rhythm  Abdomen:  Soft, nontender  Extremities: No peripheral edema  Neurologic: Nonfocal, moving all four extremities  Skin: No lesions  Access: Left aVF    Basic Metabolic Panel: Recent Labs  Lab 01/05/22 0515 01/07/22 0554  NA 138 133*  K 4.0  4.2  CL 100 92*  CO2 29 31  GLUCOSE 123* 131*  BUN 41* 39*  CREATININE 6.95* 6.55*  CALCIUM 8.4* 8.9  MG 1.7  --   PHOS  --  4.0     Liver Function Tests: Recent Labs  Lab 01/07/22 0554  ALBUMIN 3.4*    No results for input(s): LIPASE, AMYLASE in the last 168 hours. No results for input(s): AMMONIA in the last 168 hours.  CBC: Recent Labs  Lab 01/05/22 0515 01/07/22 0554  WBC 5.8 7.3  HGB 9.6* 9.7*  HCT 29.3* 29.2*  MCV 91.3 88.8  PLT 153 180     Cardiac Enzymes: No results for input(s): CKTOTAL, CKMB, CKMBINDEX, TROPONINI in the last 168 hours.  BNP: Invalid input(s): POCBNP  CBG: Recent Labs  Lab 01/10/22 1211 01/10/22 1650 01/10/22 2102 01/11/22 0745 01/11/22 1302  GLUCAP 158* 184* 208* 120* 91     Microbiology: Results for orders placed or performed during the hospital encounter of 01/03/22  Resp Panel by RT-PCR (Flu A&B, Covid) Nasopharyngeal Swab     Status: None   Collection Time: 01/03/22 12:31 AM   Specimen: Nasopharyngeal Swab; Nasopharyngeal(NP) swabs in vial transport medium  Result Value Ref Range Status   SARS Coronavirus 2 by RT PCR NEGATIVE NEGATIVE Final    Comment: (NOTE) SARS-CoV-2 target nucleic acids are NOT DETECTED.  The SARS-CoV-2 RNA is generally detectable in upper respiratory specimens during the acute phase of infection. The lowest concentration of SARS-CoV-2 viral copies this assay can detect is 138 copies/mL. A negative result does not preclude SARS-Cov-2 infection and  should not be used as the sole basis for treatment or other patient management decisions. A negative result may occur with  improper specimen collection/handling, submission of specimen other than nasopharyngeal swab, presence of viral mutation(s) within the areas targeted by this assay, and inadequate number of viral copies(<138 copies/mL). A negative result must be combined with clinical observations, patient history, and  epidemiological information. The expected result is Negative.  Fact Sheet for Patients:  EntrepreneurPulse.com.au  Fact Sheet for Healthcare Providers:  IncredibleEmployment.be  This test is no t yet approved or cleared by the [place] FDA and  has been authorized for detection and/or diagnosis of SARS-CoV-2 by FDA under an Emergency Use Authorization (EUA). This EUA will remain  in effect (meaning this test can be used) for the duration of the COVID-19 declaration under Section 564(b)(1) of the Act, 21 U.S.C.section 360bbb-3(b)(1), unless the authorization is terminated  or revoked sooner.       Influenza A by PCR NEGATIVE NEGATIVE Final   Influenza B by PCR NEGATIVE NEGATIVE Final    Comment: (NOTE) The Xpert Xpress SARS-CoV-2/FLU/RSV plus assay is intended as an aid in the diagnosis of influenza from Nasopharyngeal swab specimens and should not be used as a sole basis for treatment. Nasal washings and aspirates are unacceptable for Xpert Xpress SARS-CoV-2/FLU/RSV testing.  Fact Sheet for Patients: EntrepreneurPulse.com.au  Fact Sheet for Healthcare Providers: IncredibleEmployment.be  This test is not yet approved or cleared by the [place] FDA and has been authorized for detection and/or diagnosis of SARS-CoV-2 by FDA under an Emergency Use Authorization (EUA). This EUA will remain in effect (meaning this test can be used) for the duration of the COVID-19 declaration under Section 564(b)(1) of the Act, 21 U.S.C. section 360bbb-3(b)(1), unless the authorization is terminated or revoked.  Performed at [facility], [address] [place], [place] [id number]     Coagulation Studies: No results for input(s): LABPROT, INR in the last 72 hours.  Urinalysis: No results for input(s): COLORURINE, LABSPEC, PHURINE, GLUCOSEU, HGBUR, BILIRUBINUR, KETONESUR, PROTEINUR, UROBILINOGEN,  NITRITE, LEUKOCYTESUR in the last 72 hours.  Invalid input(s): APPERANCEUR    Imaging: No results found.   Medications:      albuterol  2.5 mg Nebulization TID   amLODipine  10 mg Oral Daily   apixaban  2.5 mg Oral BID   aspirin EC  81 mg Oral Daily   atorvastatin  80 mg Oral q1800   carvedilol  25 mg Oral BID   Chlorhexidine Gluconate Cloth  6 each Topical Q0600   escitalopram  10 mg Oral Daily   furosemide  40 mg Oral Once per day on Sun Tue Thu Sat   gabapentin  300 mg Oral QHS   insulin aspart  0-6 Units Subcutaneous TID WC   insulin detemir  12 Units Subcutaneous QHS   irbesartan  150 mg Oral Daily   midodrine  5 mg Oral Once per day on Mon Wed Fri   sevelamer carbonate  1,600 mg Oral TID WC   traZODone  50 mg Oral Once per day on Mon Wed Fri   acetaminophen **OR** acetaminophen, magnesium hydroxide, ondansetron **OR** ondansetron (ZOFRAN) IV, traZODone  Assessment/ Plan:  [name] is a 66 y.o.  female with a past medical history including diabetes, hyperlipidemia, hypertension, CHF, glottic stenosis status post chronic tracheostomy, and end-stage renal disease on dialysis.  Patient presents to the emergency department with complaints of shortness of breath.  Patient states she has missed  the previous 2 dialysis treatments due to home health nurse inability to accompany patient to outpatient treatments.   [facility] [address]/MWF/Lt AVF/ 64kg  Respiratory distress with end-stage renal disease on dialysis.    Receiving dialysis today with UF goal 1.5 to 2 L as tolerated.  Next treatment scheduled for Friday.    Case management currently seeking outpatient agency to accompany patient to dialysis treatments. Once this is arranged, patient is cleared for discharge.   2. Anemia of chronic kidney disease Lab Results  Component Value Date   HGB 9.7 (L) 01/07/2022  Hemoglobin within desired target  3. Secondary Hyperparathyroidism:  Lab Results  Component Value  Date   PTH 169 (H) 11/12/2020   CALCIUM 8.9 01/07/2022   CAION 1.09 (L) 09/01/2016   PHOS 4.0 01/07/2022    We will continue to monitor bone minerals during this admission Continue sevelamer with meals   LOS: [place] 2/8/20231:17 PM

## 2022-01-11 NOTE — Assessment & Plan Note (Signed)
Continue Levemir and sliding scale NovoLog

## 2022-01-11 NOTE — Hospital Course (Addendum)
Deborah [name] is a 66 y.o. female with medical history significant for atrial fibrillation on Eliquis, type II DM, ESRD on hemodialysis on MWF, hypertension, dyslipidemia, glottic stenosis s/p tracheostomy, who presented to the hospital because of wheezing, worsening shortness of breath and productive cough after missing 2 outpatient hemodialysis sessions.  Patient has to be accompanied by a nurse to the dialysis unit but apparently the nurses have been calling out and so she had not been able to go to dialysis.  She was diagnosed with acute pulmonary edema/volume overload/acute on chronic diastolic CHF from missed hemodialysis.  She was emergently dialyzed in the emergency room with plan for discharge back home.  However, she became acutely short of breath with increasing oxygen requirement up to 5 L while waiting to be discharged from the emergency room.  She was subsequently admitted to the hospital for further management.  Patient stable for discharge and continuing outpatient dialysis with home health nursing to accompany for trach care at the dialysis center.

## 2022-01-11 NOTE — Assessment & Plan Note (Signed)
On dialysis MWF. Nephrology following. Discharge barrier: Patient requires nurse to attend outpatient dialysis for trach care.

## 2022-01-11 NOTE — Assessment & Plan Note (Signed)
Secondary to missing dialysis.  Continue Lasix and dialysis per nephrology.  Monitor volume status

## 2022-01-11 NOTE — Assessment & Plan Note (Signed)
Emergently dialyzed in the ED. Continue volume management per dialysis

## 2022-01-11 NOTE — Assessment & Plan Note (Signed)
Continue gabapentin.

## 2022-01-11 NOTE — Progress Notes (Signed)
Progress Note   Patient: Deborah [name] XTK:[id number] DOB: 1956-07-21 DOA: 01/03/2022     7 DOS: the patient was seen and examined on 01/11/2022   Brief hospital course: Deborah [name] is a 66 y.o. female with medical history significant for atrial fibrillation on Eliquis, type II DM, ESRD on hemodialysis on MWF, hypertension, dyslipidemia, glottic stenosis s/p tracheostomy, who presented to the hospital because of wheezing, worsening shortness of breath and productive cough after missing 2 outpatient hemodialysis sessions.  Patient has to be accompanied by a nurse to the dialysis unit but apparently the nurses have been calling out and so she had not been able to go to dialysis.  She was diagnosed with acute pulmonary edema/volume overload/acute on chronic diastolic CHF from missed hemodialysis.  She was emergently dialyzed in the emergency room with plan for discharge back home.  However, she became acutely short of breath with increasing oxygen requirement up to 5 L while waiting to be discharged from the emergency room.  She was subsequently admitted to the hospital for further management.  Discharge is pending arrangements for a nurse to attend dialysis with patient in outpatient setting for trach care.  Assessment and Plan: * Acute pulmonary edema ([place])- (present on admission) Emergently dialyzed in the ED. Continue volume management per dialysis  ESRD (end stage renal disease) ([place])- (present on admission) On dialysis MWF. Nephrology following. Discharge barrier: Patient requires nurse to attend outpatient dialysis for trach care.  Acute on chronic diastolic CHF (congestive heart failure) ([place])- (present on admission) Secondary to missing dialysis.  Continue Lasix and dialysis per nephrology.  Monitor volume status  Diabetic neuropathy ([place])- (present on admission) Continue gabapentin  Acute on chronic respiratory failure with hypoxemia ([place])- (present on admission) At baseline,  patient requires 3 L/min supplemental O2.  Currently with trach collar at 5 L/min, on room air at times with charted O2 sats 87 to 95%  Anemia of chronic disease- (present on admission) Hemoglobin stable  Diabetes mellitus type 2 in nonobese ([name]) Continue Levemir and sliding scale NovoLog  PAF (paroxysmal atrial fibrillation) ([place])- (present on admission) Heart rates overall controlled.  Continue Eliquis and Coreg  Hyperlipidemia- (present on admission) Continue Lipitor  Hypertension- (present on admission) Continue amlodipine, Coreg, Lasix, irbesartan        Subjective: Patient seen during dialysis this morning.  She denies acute complaints including fevers chills, shortness of breath or chest pain, nausea vomiting.  Tolerating dialysis without issues.  Physical Exam: Vitals:   01/11/22 1116 01/11/22 1130 01/11/22 1131 01/11/22 1207  BP: (!) 147/68 (!) 151/65 (!) 150/67   Pulse: 77 81 81 83  Resp: 13 13 14 19   Temp:   98.4 F (36.9 C)   TempSrc:   Oral   SpO2: (!) 88% (!) 88% (!) 88% 96%  Weight:   67.6 kg   Height:       General exam: awake, alert, no acute distress HEENT: Tracheostomy present, moist mucus membranes, hearing grossly normal  Respiratory system: CTAB, trach in place, no wheezes, rales or rhonchi, normal respiratory effort. Cardiovascular system: normal D9/M4, RRR, systolic murmur noted, no peripheral edema.   Gastrointestinal system: soft, NT, ND, no HSM felt, +bowel sounds. Central nervous system: no gross focal neurologic deficits Extremities: Left upper extremity fistula accessed with dialysis underway, no edema, normal tone Skin: dry, intact, normal temperature Psychiatry: normal mood, congruent affect, judgement and insight appear normal   Data Reviewed:  Labs reviewed notable only for CBGs controlled: Past  24 hours of 118, 158, 184, 208, 120 this morning  Family Communication: None at bedside on rounds  Disposition: Status is:  Inpatient Remains inpatient appropriate because: Pending arrangements for nurse to attend outpatient dialysis with patient for trach care          Planned Discharge Destination: Home     Time spent: 35 minutes  Author: Ezekiel Slocumb, DO 01/11/2022 12:41 PM  For on call review [email or web address].

## 2022-01-12 DIAGNOSIS — J810 Acute pulmonary edema: Secondary | ICD-10-CM | POA: Diagnosis not present

## 2022-01-12 LAB — GLUCOSE, CAPILLARY
Glucose-Capillary: 102 mg/dL — ABNORMAL HIGH (ref 70–99)
Glucose-Capillary: 145 mg/dL — ABNORMAL HIGH (ref 70–99)
Glucose-Capillary: 221 mg/dL — ABNORMAL HIGH (ref 70–99)
Glucose-Capillary: 204 mg/dL — ABNORMAL HIGH (ref 70–99)

## 2022-01-12 MED ORDER — ALBUTEROL SULFATE (2.5 MG/3ML) 0.083% IN NEBU: 2.5000 mg | INHALATION_SOLUTION | Freq: Two times a day (BID) | RESPIRATORY_TRACT | Status: DC

## 2022-01-12 MED ORDER — ALBUTEROL SULFATE (2.5 MG/3ML) 0.083% IN NEBU
2.5000 mg | INHALATION_SOLUTION | Freq: Three times a day (TID) | RESPIRATORY_TRACT | Status: DC
  Administered 2022-01-12 – 2022-01-17 (×14): 2.5 mg via RESPIRATORY_TRACT
  Filled 2022-01-12 (×16): qty 3

## 2022-01-12 NOTE — Consult Note (Signed)
..Sapir, Lavey [id number] [birth year] Deborah Slocumb, DO  Reason for Consult: evaluation of tracheostomy  HPI: 66 y.o. female with longstanding history of tracheostomy tube dependence.  Asked to evaluate for candidacy for capping or decannulation.  Per chart review, tracheostomy placed in 2017 by Dr. 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  This was causing respiratory compromised and she required tracheostomy and was on the ventilator for many days following.  She had a previous tracheostomy prior to the one placed in 2017 and when this was decannulated, she became stridorous and had breathing difficulties and respiratory compromise.  Evaluation at that time revealed that difficulty was noted with even putting in a size 6 shiley through her trachea.    Allergies: No Known Allergies  ROS: Review of systems normal other than 12 systems except per HPI.  PMH:  Past Medical History:  Diagnosis Date   Atrial fibrillation ([place])    Diabetes mellitus    ESRD (end stage renal disease) ([place])    Hyperlipidemia    Hypertension    MI, old    Renal disorder    dialysls    FH:  Family History  Problem Relation Age of Onset   Hypertension Mother    Diabetes Mother    Breast cancer Sister    Hypertension Father     SH:  Social History   Socioeconomic History   Marital status: Widowed    Spouse name: Not on file   Number of children: 6   Years of education: Not on file   Highest education level: Not on file  Occupational History   Not on file  Tobacco Use   Smoking status: Never   Smokeless tobacco: Never  Substance and Sexual Activity   Alcohol use: No   Drug use: No   Sexual activity: Not on file  Other Topics Concern   Not on file  Social History Narrative   Lives in [place]. Has 2 daughtesr, 3 grandkids.   Social Determinants of Health   Financial Resource Strain: Not on file  Food Insecurity: Not on file   Transportation Needs: Not on file  Physical Activity: Not on file  Stress: Not on file  Social Connections: Not on file  Intimate Partner Violence: Not on file    PSH:  Past Surgical History:  Procedure Laterality Date   A/V FISTULAGRAM N/A 10/05/2017   Procedure: A/V Fistulagram;  Surgeon: Katha Cabal, MD;  Location: [place] [facility];  Service: Cardiovascular;  Laterality: N/A;   A/V SHUNTOGRAM N/A 10/05/2017   Procedure: A/V SHUNTOGRAM;  Surgeon: Katha Cabal, MD;  Location: [place] [facility];  Service: Cardiovascular;  Laterality: N/A;   AV FISTULA PLACEMENT Left 10/02/2016   Procedure: INSERTION OF ARTERIOVENOUS (AV) GORE-TEX GRAFT ARM;  Surgeon: Waynetta Sandy, MD;  Location: [place];  Service: Vascular;  Laterality: Left;   CARDIAC CATHETERIZATION N/A 08/15/2016   Procedure: Left Heart Cath and Coronary Angiography;  Surgeon: Yolonda Kida, MD;  Location: [place] [facility];  Service: Cardiovascular;  Laterality: N/A;   CARDIAC CATHETERIZATION N/A 08/15/2016   Procedure: Coronary Stent Intervention;  Surgeon: Yolonda Kida, MD;  Location: [place] [facility];  Service: Cardiovascular;  Laterality: N/A;   CARDIAC CATHETERIZATION N/A 08/18/2016   Procedure: Right Heart Cath;  Surgeon: Jolaine Artist, MD;  Location: [place] [facility];  Service: Cardiovascular;  Laterality: N/A;   CARDIAC CATHETERIZATION N/A 08/18/2016  Procedure: IABP Insertion;  Surgeon: Jolaine Artist, MD;  Location: [place] [facility];  Service: Cardiovascular;  Laterality: N/A;   CARDIAC CATHETERIZATION Right 08/23/2016   Procedure: CENTRAL LINE INSERTION RIGHT SUBCLAVIAN;  Surgeon: Ivin Poot, MD;  Location: [place];  Service: Open Heart Surgery;  Laterality: Right;   ENDOVEIN HARVEST OF GREATER SAPHENOUS VEIN Left 08/23/2016   Procedure: ENDOVEIN HARVEST OF GREATER SAPHENOUS VEIN;  Surgeon: Ivin Poot, MD;  Location: [place];  Service: Open Heart Surgery;   Laterality: Left;   INSERTION OF DIALYSIS CATHETER Bilateral 08/31/2016   Procedure: INSERTION OF DIALYSIS CATHETER LEFT INTERNAL JUGULAR VEIN & INSERTION OF TRIPLE LUMEN RIGHT INTERNAL JUGULAR VEIN;  Surgeon: Angelia Mould, MD;  Location: [place];  Service: Vascular;  Laterality: Bilateral;   INTRAOPERATIVE TRANSESOPHAGEAL ECHOCARDIOGRAM N/A 08/23/2016   Procedure: INTRAOPERATIVE TRANSESOPHAGEAL ECHOCARDIOGRAM;  Surgeon: Ivin Poot, MD;  Location: [place];  Service: Open Heart Surgery;  Laterality: N/A;   IR GENERIC HISTORICAL  11/13/2016   IR US GUIDE VASC ACCESS LEFT 11/13/2016 Corrie Mckusick, DO [facility]   IR GENERIC HISTORICAL  11/13/2016   IR FLUORO GUIDE CV LINE LEFT 11/13/2016 Corrie Mckusick, DO [facility]   IR GENERIC HISTORICAL  11/13/2016   IR GASTROSTOMY TUBE MOD SED 11/13/2016 Corrie Mckusick, DO [facility]   MITRAL VALVE REPAIR N/A 08/23/2016   Procedure: MITRAL VALVE REPAIR (MVR) USING 25MM EDWARDS MAGNA EASE BIOPROSTHESIS MITRAL  VALVE;  Surgeon: Ivin Poot, MD;  Location: [place];  Service: Open Heart Surgery;  Laterality: N/A;   tracheostomy reversal     TRACHEOSTOMY TUBE PLACEMENT N/A 11/09/2016   Procedure: TRACHEOSTOMY;  Surgeon: Melissa Montane, MD;  Location: [place];  Service: ENT;  Laterality: N/A;   VAGINAL DELIVERY     x 6    Physical  Exam:  GEN- NAD sitting upright in bed NOSE- clear anteriorly NECK- trach patent NEURO-CN 2-12 grossly intact and symmetric. EARS- EAC/TMs normal BL.  OC/OP- Oral cavity, lips, gums, ororpharynx normal with no masses or lesions. EXT- Skin warm and dry.    Procedure:  After verbal consent was obtained, the patient's nasal cavities were anesthetized with gen-nasal and lidocaine.  A trans-nasal flexible laryngoscope was inserted into the patient's right nasal cavity.  This was advanced for visualization of the patient's nasopharynx and pharynx and larynx.  No abnormal masses or lesions were identified.  Her true vocal folds  were visualized and noted to be paramedian position.  With phonation she did have adduction and some movement of arytenoids with abduction and slight lateral movement.  No appreciable airway was created, however.  Questionable possible subglottic narrowing as well.  A/P: history of bilateral true vocal fold paralysis and respiratory distress s/p tracheostomy tube placement x2  Plan:  Given previous history of trach in 2017 with subsequent decannulation and then respiratory distress and subsequent need for repeat trach with continued bilateral true vocal fold paralysis, this patient is not a candidate for capping or decannulation.  Discussed findings with patient and recommend evalaution by Dr. Denyse Dago at [facility] for discussion of options for removal of tracheostomy tube as an elective procedure in the future.  Discussed that many times she will have to choose between her voice or her tracheostomy tube given the midline position of her vocal folds.  She demonstrated understanding and agrees with plan.   Deborah [name] 01/12/2022 4:20 PM

## 2022-01-12 NOTE — Progress Notes (Signed)
Progress Note   Patient: Deborah [name] FUX:[id number] DOB: 10/23/1956 DOA: 01/03/2022     8 DOS: the patient was seen and examined on 01/12/2022   Brief hospital course: [name] is a 66 y.o. female with medical history significant for atrial fibrillation on Eliquis, type II DM, ESRD on hemodialysis on MWF, hypertension, dyslipidemia, glottic stenosis s/p tracheostomy, who presented to the hospital because of wheezing, worsening shortness of breath and productive cough after missing 2 outpatient hemodialysis sessions.  Patient has to be accompanied by a nurse to the dialysis unit but apparently the nurses have been calling out and so she had not been able to go to dialysis.  She was diagnosed with acute pulmonary edema/volume overload/acute on chronic diastolic CHF from missed hemodialysis.  She was emergently dialyzed in the emergency room with plan for discharge back home.  However, she became acutely short of breath with increasing oxygen requirement up to 5 L while waiting to be discharged from the emergency room.  She was subsequently admitted to the hospital for further management.  Discharge is pending arrangements for a nurse to attend dialysis with patient in outpatient setting for trach care.  Assessment and Plan: * Acute pulmonary edema ([place])- (present on admission) Emergently dialyzed in the ED. Continue volume management per dialysis  ESRD (end stage renal disease) ([place])- (present on admission) On dialysis MWF. Nephrology following. Discharge barrier: Patient requires nurse to attend outpatient dialysis for trach care.  Acute on chronic diastolic CHF (congestive heart failure) ([place])- (present on admission) Secondary to missing dialysis.  Continue Lasix and dialysis per nephrology.  Monitor volume status  Diabetic neuropathy ([place])- (present on admission) Continue gabapentin  Acute on chronic respiratory failure with hypoxemia ([place])- (present on admission) At baseline,  patient requires 3 L/min supplemental O2.  Currently with trach collar at 5 L/min, on room air at times with charted O2 sats 87 to 95%  Anemia of chronic disease- (present on admission) Hemoglobin stable  Diabetes mellitus type 2 in nonobese ([name]) Continue Levemir and sliding scale NovoLog  PAF (paroxysmal atrial fibrillation) ([place])- (present on admission) Heart rates overall controlled.  Continue Eliquis and Coreg  Hyperlipidemia- (present on admission) Continue Lipitor  Hypertension- (present on admission) Continue amlodipine, Coreg, Lasix, irbesartan        Subjective: Patient sitting up in bed awake when seen today.  She denies any acute complaints including any shortness of breath, fevers chills, chest pain, nausea vomiting.  Says she is feeling well.  Sleeping well.  Appetite is good.  No acute events reported.  Physical Exam: Vitals:   01/12/22 0527 01/12/22 0711 01/12/22 0743 01/12/22 1147  BP: 140/65  (!) 124/59 136/60  Pulse: 67  64 67  Resp: 16  18 18   Temp: 98.4 F (36.9 C)  98.2 F (36.8 C) 97.8 F (36.6 C)  TempSrc: Oral  Oral   SpO2: 90% 91% 91% 100%  Weight: 70.6 kg     Height:       General exam: awake, alert, no acute distress HEENT: Tracheostomy in place with trach collar at 5 L/min moist mucus membranes, hearing grossly normal  Respiratory system: CTAB with diminished bases, trach collar 5 L/min, no wheezes, rales or rhonchi, normal respiratory effort. Cardiovascular system: normal S1/S2, RRR, no pedal edema.   Central nervous system: A&O x3. no gross focal neurologic deficits, normal speech Extremities: moves all, left upper extremity fistula, normal tone Skin: dry, intact, normal temperature Psychiatry: normal mood, congruent affect, judgement and  insight appear normal   Data Reviewed:  CBGs past 24 hours: 120, 91, 130, 154, 187, 102, 145  Family Communication: None at bedside.  No medical updates at this time  Disposition: Status is:  Inpatient Remains inpatient appropriate because: Requires nursing staff to attend outpatient dialysis for trach care, these arrangements are still underway.  Patient requires dialysis in the hospital in the meantime          Planned Discharge Destination: Home     Time spent: 20 minutes  Author: Ezekiel Slocumb, DO 01/12/2022 1:36 PM  For on call review [email or web address].

## 2022-01-12 NOTE — Progress Notes (Signed)
[facility]  ROUNDING NOTE   Subjective:   Deborah [name] is a 66 year old female with a past medical history including diabetes, hyperlipidemia, hypertension, CHF, glottic stenosis status post chronic tracheostomy, and end-stage renal disease on dialysis.  Patient presents to the emergency department with complaints of shortness of breath.   Patient seen sitting at side of bed, alert and oriented Currently performing ADLs independently Tolerating meals without nausea and vomiting Denies shortness of breath States she normally is on room air during the day and receives oxygen at night when home. Discussed capping trach, patient states it has been tried in the past and unsuccessful due to difficulty breathing around it.  Dialysis yesterday tolerated well   Objective:  Vital signs in last 24 hours:  Temp:  [98 F (36.7 C)-99.2 F (37.3 C)] 98.2 F (36.8 C) (02/09 0743) Pulse Rate:  [64-87] 64 (02/09 0743) Resp:  [13-19] 18 (02/09 0743) BP: (123-151)/(59-69) 124/59 (02/09 0743) SpO2:  [83 %-97 %] 91 % (02/09 0743) FiO2 (%):  [28 %] 28 % (02/09 0711) Weight:  [67.6 kg-70.6 kg] 70.6 kg (02/09 0527)  Weight change:  Filed Weights   01/09/22 1226 01/11/22 1131 01/12/22 0527  Weight: 66.1 kg 67.6 kg 70.6 kg    Intake/Output: No intake/output data recorded.   Intake/Output this shift:  Total I/O In: 240 [P.O.:240] Out: -   Physical Exam: General: NAD  Head: Normocephalic, atraumatic. Moist oral mucosal membranes  Eyes: Anicteric  Lungs:  Clear to auscultation, normal effort, trach  Heart: Regular rate and rhythm  Abdomen:  Soft, nontender  Extremities: No peripheral edema  Neurologic: Nonfocal, moving all four extremities  Skin: No lesions  Access: Left aVF    Basic Metabolic Panel: Recent Labs  Lab 01/07/22 0554  NA 133*  K 4.2  CL 92*  CO2 31  GLUCOSE 131*  BUN 39*  CREATININE 6.55*  CALCIUM 8.9  PHOS 4.0     Liver Function Tests: Recent  Labs  Lab 01/07/22 0554  ALBUMIN 3.4*    No results for input(s): LIPASE, AMYLASE in the last 168 hours. No results for input(s): AMMONIA in the last 168 hours.  CBC: Recent Labs  Lab 01/07/22 0554  WBC 7.3  HGB 9.7*  HCT 29.2*  MCV 88.8  PLT 180     Cardiac Enzymes: No results for input(s): CKTOTAL, CKMB, CKMBINDEX, TROPONINI in the last 168 hours.  BNP: Invalid input(s): POCBNP  CBG: Recent Labs  Lab 01/11/22 1302 01/11/22 1518 01/11/22 1635 01/11/22 2151 01/12/22 0745  GLUCAP 91 130* 154* 187* 102*     Microbiology: Results for orders placed or performed during the hospital encounter of 01/03/22  Resp Panel by RT-PCR (Flu A&B, Covid) Nasopharyngeal Swab     Status: None   Collection Time: 01/03/22 12:31 AM   Specimen: Nasopharyngeal Swab; Nasopharyngeal(NP) swabs in vial transport medium  Result Value Ref Range Status   SARS Coronavirus 2 by RT PCR NEGATIVE NEGATIVE Final    Comment: (NOTE) SARS-CoV-2 target nucleic acids are NOT DETECTED.  The SARS-CoV-2 RNA is generally detectable in upper respiratory specimens during the acute phase of infection. The lowest concentration of SARS-CoV-2 viral copies this assay can detect is 138 copies/mL. A negative result does not preclude SARS-Cov-2 infection and should not be used as the sole basis for treatment or other patient management decisions. A negative result may occur with  improper specimen collection/handling, submission of specimen other than nasopharyngeal swab, presence of viral mutation(s) within the  areas targeted by this assay, and inadequate number of viral copies(<138 copies/mL). A negative result must be combined with clinical observations, patient history, and epidemiological information. The expected result is Negative.  Fact Sheet for Patients:  EntrepreneurPulse.com.au  Fact Sheet for Healthcare Providers:  IncredibleEmployment.be  This test is no t  yet approved or cleared by the [place] FDA and  has been authorized for detection and/or diagnosis of SARS-CoV-2 by FDA under an Emergency Use Authorization (EUA). This EUA will remain  in effect (meaning this test can be used) for the duration of the COVID-19 declaration under Section 564(b)(1) of the Act, 21 U.S.C.section 360bbb-3(b)(1), unless the authorization is terminated  or revoked sooner.       Influenza A by PCR NEGATIVE NEGATIVE Final   Influenza B by PCR NEGATIVE NEGATIVE Final    Comment: (NOTE) The Xpert Xpress SARS-CoV-2/FLU/RSV plus assay is intended as an aid in the diagnosis of influenza from Nasopharyngeal swab specimens and should not be used as a sole basis for treatment. Nasal washings and aspirates are unacceptable for Xpert Xpress SARS-CoV-2/FLU/RSV testing.  Fact Sheet for Patients: EntrepreneurPulse.com.au  Fact Sheet for Healthcare Providers: IncredibleEmployment.be  This test is not yet approved or cleared by the [place] FDA and has been authorized for detection and/or diagnosis of SARS-CoV-2 by FDA under an Emergency Use Authorization (EUA). This EUA will remain in effect (meaning this test can be used) for the duration of the COVID-19 declaration under Section 564(b)(1) of the Act, 21 U.S.C. section 360bbb-3(b)(1), unless the authorization is terminated or revoked.  Performed at [facility], [address] [place], [place] [address]     Coagulation Studies: No results for input(s): LABPROT, INR in the last 72 hours.  Urinalysis: No results for input(s): COLORURINE, LABSPEC, PHURINE, GLUCOSEU, HGBUR, BILIRUBINUR, KETONESUR, PROTEINUR, UROBILINOGEN, NITRITE, LEUKOCYTESUR in the last 72 hours.  Invalid input(s): APPERANCEUR    Imaging: No results found.   Medications:      albuterol  2.5 mg Nebulization BID   amLODipine  10 mg Oral Daily   apixaban  2.5 mg Oral BID   aspirin  EC  81 mg Oral Daily   atorvastatin  80 mg Oral q1800   carvedilol  25 mg Oral BID   Chlorhexidine Gluconate Cloth  6 each Topical Q0600   escitalopram  10 mg Oral Daily   furosemide  40 mg Oral Once per day on Sun Tue Thu Sat   gabapentin  300 mg Oral QHS   insulin aspart  0-6 Units Subcutaneous TID WC   insulin detemir  12 Units Subcutaneous QHS   irbesartan  150 mg Oral Daily   midodrine  5 mg Oral Once per day on Mon Wed Fri   sevelamer carbonate  1,600 mg Oral TID WC   traZODone  50 mg Oral Once per day on Mon Wed Fri   acetaminophen **OR** acetaminophen, magnesium hydroxide, ondansetron **OR** ondansetron (ZOFRAN) IV, traZODone  Assessment/ Plan:  [name] is a 66 y.o.  female with a past medical history including diabetes, hyperlipidemia, hypertension, CHF, glottic stenosis status post chronic tracheostomy, and end-stage renal disease on dialysis.  Patient presents to the emergency department with complaints of shortness of breath.  Patient states she has missed the previous 2 dialysis treatments due to home health nurse inability to accompany patient to outpatient treatments.   [facility] [address]/MWF/Lt AVF/ 64kg  Respiratory distress with end-stage renal disease on dialysis.    Received  dialysis yesterday, tolerated well.  No UF.  Next treatment scheduled for Friday.  Case management continuing search for outpatient agency to accompany patient to dialysis treatments at outpatient clinic.  1 accepting agency unable to commit to chaperone with each clinic visit.  Will contact outpatient clinic and nephrologist to determine course of treatment.  We will consult to ENT to evaluate trach and offer recommendations.  2. Anemia of chronic kidney disease Lab Results  Component Value Date   HGB 9.7 (L) 01/07/2022  Hemoglobin at goal  3. Secondary Hyperparathyroidism:  Lab Results  Component Value Date   PTH 169 (H) 11/12/2020   CALCIUM 8.9 01/07/2022   CAION 1.09 (L)  09/01/2016   PHOS 4.0 01/07/2022  Calcium and phosphorus within acceptable range Continue sevelamer with meals   LOS: 8   2/9/202311:23 AM

## 2022-01-12 NOTE — TOC Progression Note (Signed)
Transition of Care [facility]) - Progression Note    Patient Details  Name: Deborah [name] MRN: [id number] Date of Birth: 1956-06-05  Transition of Care [facility]) CM/SW Contact  Anselm Pancoast, RN Phone Number: 01/12/2022, 2:20 PM  Clinical Narrative:    Updated that teaching is available and can be started with the staff to facilitate HD outpatient however at this time family is considering process of learning home HD. Family is prepared to discuss and give final answer tomorrow.         Expected Discharge Plan and Services                                                 Social Determinants of Health (SDOH) Interventions    Readmission Risk Interventions Readmission Risk Prevention Plan 11/04/2020 04/02/2020 10/18/2019  Transportation Screening Complete Complete Complete  Medication Review Press photographer) (No Data) Complete -  PCP or Specialist appointment within 3-5 days of discharge Complete Complete Complete  HRI or Home Care Consult (No Data) Complete -  SW Recovery Care/Counseling Consult Complete - -  Palliative Care Screening Not Applicable - -  [place] Not Applicable Complete Not Applicable  Some recent data might be hidden

## 2022-01-12 NOTE — TOC Progression Note (Addendum)
Transition of Care [facility]) - Progression Note    Patient Details  Name: Deborah [name] MRN: [id number] Date of Birth: 20-Mar-1956  Transition of Care [facility]) CM/SW [place], RN Phone Number: 01/12/2022, 11:47 AM  Clinical Narrative:    Damaris Schooner to Amanda-Dialysis Navigator regarding potential options for discharge as patient continues to need nursing accompany her to dialysis and so far hospital has been unable to find agency willing to accommodate request. Two options currently being discussed are trying to find hospital staff able to assist with education to HD center to certify in trach care and HD navigator is speaking with family regarding possible home dialysis. Will continue to explore options.           Expected Discharge Plan and Services                                                 Social Determinants of Health (SDOH) Interventions    Readmission Risk Interventions Readmission Risk Prevention Plan 11/04/2020 04/02/2020 10/18/2019  Transportation Screening Complete Complete Complete  Medication Review Press photographer) (No Data) Complete -  PCP or Specialist appointment within 3-5 days of discharge Complete Complete Complete  HRI or Home Care Consult (No Data) Complete -  SW Recovery Care/Counseling Consult Complete - -  Palliative Care Screening Not Applicable - -  [place] Not Applicable Complete Not Applicable  Some recent data might be hidden

## 2022-01-13 DIAGNOSIS — J810 Acute pulmonary edema: Secondary | ICD-10-CM | POA: Diagnosis not present

## 2022-01-13 LAB — GLUCOSE, CAPILLARY
Glucose-Capillary: 85 mg/dL (ref 70–99)
Glucose-Capillary: 146 mg/dL — ABNORMAL HIGH (ref 70–99)
Glucose-Capillary: 144 mg/dL — ABNORMAL HIGH (ref 70–99)
Glucose-Capillary: 257 mg/dL — ABNORMAL HIGH (ref 70–99)

## 2022-01-13 LAB — HEPATITIS B CORE ANTIBODY, TOTAL: Hep B Core Total Ab: NONREACTIVE

## 2022-01-13 NOTE — TOC Progression Note (Signed)
Transition of Care [facility]) - Progression Note    Patient Details  Name: Deborah [name] MRN: [id number] Date of Birth: October 19, 1956  Transition of Care [facility]) CM/SW [place], RN Phone Number: 01/13/2022, 1:39 PM  Clinical Narrative:   As per Dialysis Coordinator Estill Bamberg, they are working on sending patient to another clinic for dialysis to facilitate her trach care during procedures.  Estill Bamberg will update staff when a final plan is in place.  TOC to follow         Expected Discharge Plan and Services                                                 Social Determinants of Health (SDOH) Interventions    Readmission Risk Interventions Readmission Risk Prevention Plan 11/04/2020 04/02/2020 10/18/2019  Transportation Screening Complete Complete Complete  Medication Review Press photographer) (No Data) Complete -  PCP or Specialist appointment within 3-5 days of discharge Complete Complete Complete  HRI or Home Care Consult (No Data) Complete -  SW Recovery Care/Counseling Consult Complete - -  Palliative Care Screening Not Applicable - -  [place] Not Applicable Complete Not Applicable  Some recent data might be hidden

## 2022-01-13 NOTE — Progress Notes (Signed)
Progress Note   Patient: Deborah [name] GGY:[id number] DOB: 1956/10/03 DOA: 01/03/2022     9 DOS: the patient was seen and examined on 01/13/2022   Brief hospital course: Deborah [name] is a 66 y.o. female with medical history significant for atrial fibrillation on Eliquis, type II DM, ESRD on hemodialysis on MWF, hypertension, dyslipidemia, glottic stenosis s/p tracheostomy, who presented to the hospital because of wheezing, worsening shortness of breath and productive cough after missing 2 outpatient hemodialysis sessions.  Patient has to be accompanied by a nurse to the dialysis unit but apparently the nurses have been calling out and so she had not been able to go to dialysis.  She was diagnosed with acute pulmonary edema/volume overload/acute on chronic diastolic CHF from missed hemodialysis.  She was emergently dialyzed in the emergency room with plan for discharge back home.  However, she became acutely short of breath with increasing oxygen requirement up to 5 L while waiting to be discharged from the emergency room.  She was subsequently admitted to the hospital for further management.  Discharge is pending arrangements for a nurse to attend dialysis with patient in outpatient setting for trach care.  Assessment and Plan: * Acute pulmonary edema (Deborah [name])- (present on admission) Emergently dialyzed in the ED. Continue volume management per dialysis  ESRD (end stage renal disease) (Deborah [name])- (present on admission) On dialysis MWF. Nephrology following. Discharge barrier: Patient requires nurse to attend outpatient dialysis for trach care.  Acute on chronic diastolic CHF (congestive heart failure) (Deborah [name])- (present on admission) Secondary to missing dialysis.  Continue Lasix and dialysis per nephrology.  Monitor volume status  Diabetic neuropathy (Deborah [name])- (present on admission) Continue gabapentin  Acute on chronic respiratory failure with hypoxemia ([name])- (present on admission) At baseline,  patient requires 3 L/min supplemental O2.  Currently with trach collar at 5 L/min, on room air at times with charted O2 sats 87 to 95%  Anemia of chronic disease- (present on admission) Hemoglobin stable  Diabetes mellitus type 2 in nonobese ([name]) Continue Levemir and sliding scale NovoLog  PAF (paroxysmal atrial fibrillation) ([place]orah [name])- (present on admission) Heart rates overall controlled.  Continue Eliquis and Coreg  Hyperlipidemia- (present on admission) Continue Lipitor  Hypertension- (present on admission) Continue amlodipine, Coreg, Lasix, irbesartan        Subjective: Patient seen during dialysis today.  She reports feeling well overall.  No issues with dialysis today.  Sleeping well at night.  Good appetite.  She denies any acute complaints at this time.  Physical Exam: Vitals:   01/13/22 1115 01/13/22 1130 01/13/22 1145 01/13/22 1152  BP: (!) 134/59 (!) 144/62 (!) 147/63 128/67  Pulse: 69 69 72 71  Resp: 18 12 17 19   Temp:    98.5 F (36.9 C)  TempSrc:    Oral  SpO2: 91% 91% 92% 91%  Weight:      Height:       General exam: awake, alert, no acute distress HEENT: Tracheostomy present, moist mucus membranes, hearing grossly normal  Respiratory system: CTAB, no wheezes, rales or rhonchi, normal respiratory effort. Cardiovascular system: normal S1/S2, RRR, no pedal edema.   Central nervous system: A&O x3. no gross focal neurologic deficits, normal speech Extremities: Left upper extremity AV fistula accessed for dialysis, no edema, normal tone Psychiatry: normal mood, congruent affect, judgement and insight appear normal   Data Reviewed:  There are no new results to review at this time.  Family Communication: None  Disposition: Status is: Inpatient Remains inpatient appropriate  because: Requires nurse for trach care at outpatient dialysis, arrangements for this are underway       Planned Discharge Destination: Home     Time spent: 25  minutes  Author: Ezekiel Slocumb, DO 01/13/2022 1:26 PM  For on call review [email or web address].

## 2022-01-13 NOTE — TOC Progression Note (Signed)
Transition of Care [facility]) - Progression Note    Patient Details  Name: Deborah [name] MRN: [id number] Date of Birth: [birth year]  Transition of Care [facility]) CM/SW [place], RN Phone Number: 01/13/2022, 4:15 PM  Clinical Narrative:    Confirmed with HD Navigator that training is scheduled for HD center to ensure staff is certified to provide trach care during HD. Patient will be able to discharge home with OP HD once training completed.         Expected Discharge Plan and Services                                                 Social Determinants of Health (SDOH) Interventions    Readmission Risk Interventions Readmission Risk Prevention Plan 11/04/2020 04/02/2020 10/18/2019  Transportation Screening Complete Complete Complete  Medication Review Press photographer) (No Data) Complete -  PCP or Specialist appointment within 3-5 days of discharge Complete Complete Complete  HRI or Home Care Consult (No Data) Complete -  SW Recovery Care/Counseling Consult Complete - -  Palliative Care Screening Not Applicable - -  [place] Not Applicable Complete Not Applicable  Some recent data might be hidden

## 2022-01-13 NOTE — Progress Notes (Signed)
Continuing to work on outpatient hemodialysis plan. Currently working on transferring patient to [facility], however, clinic requires their nurses to be trach certified to be able to care for patient in case of an emergency. Manuela Schwartz CM was able to line a up a person do provide trach training. Waiting on clinic to follow back up with available dates and time when the training can happen.

## 2022-01-13 NOTE — Care Management Important Message (Signed)
Important Message  Patient Details  Name: Deborah [name] MRN: [id number] Date of Birth: 04-Feb-1956   Medicare Important Message Given:  Yes     Juliann Pulse A Daven Montz 01/13/2022, 11:50 AM

## 2022-01-13 NOTE — Progress Notes (Signed)
[facility]  ROUNDING NOTE   Subjective:   Deborah [name] is a 66 year old female with a past medical history including diabetes, hyperlipidemia, hypertension, CHF, glottic stenosis status post chronic tracheostomy, and end-stage renal disease on dialysis.  Patient presents to the emergency department with complaints of shortness of breath.   Patient seen and evaluated during dialysis   HEMODIALYSIS FLOWSHEET:  Blood Flow Rate (mL/min): 400 mL/min Arterial Pressure (mmHg): -210 mmHg Venous Pressure (mmHg): 180 mmHg Transmembrane Pressure (mmHg): 70 mmHg Ultrafiltration Rate (mL/min): 1060 mL/min Dialysate Flow Rate (mL/min): 500 ml/min Conductivity: Machine : 13.7 Conductivity: Machine : 13.7 Dialysis Fluid Bolus: Normal Saline Bolus Amount (mL): 250 mL Dialysate Change: Other (comment) (changed to 3K)  No complaints at this time   Objective:  Vital signs in last 24 hours:  Temp:  [98.3 F (36.8 C)-98.8 F (37.1 C)] 98.5 F (36.9 C) (02/10 1152) Pulse Rate:  [61-73] 71 (02/10 1152) Resp:  [12-24] 19 (02/10 1152) BP: (123-154)/(57-74) 128/67 (02/10 1152) SpO2:  [87 %-99 %] 91 % (02/10 1152) FiO2 (%):  [28 %] 28 % (02/10 0713) Weight:  [69.6 kg] 69.6 kg (02/10 0846)  Weight change:  Filed Weights   01/11/22 1131 01/12/22 0527 01/13/22 0846  Weight: 67.6 kg 70.6 kg 69.6 kg    Intake/Output: I/O last 3 completed shifts: In: 600 [P.O.:600] Out: 1 [Urine:1]   Intake/Output this shift:  Total I/O In: -  Out: 2500 [Other:2500]  Physical Exam: General: NAD  Head: Normocephalic, atraumatic. Moist oral mucosal membranes  Eyes: Anicteric  Lungs:  Clear to auscultation, normal effort, trach  Heart: Regular rate and rhythm  Abdomen:  Soft, nontender  Extremities: No peripheral edema  Neurologic: Nonfocal, moving all four extremities  Skin: No lesions  Access: Left aVF    Basic Metabolic Panel: Recent Labs  Lab 01/07/22 0554  NA 133*  K 4.2  CL  92*  CO2 31  GLUCOSE 131*  BUN 39*  CREATININE 6.55*  CALCIUM 8.9  PHOS 4.0     Liver Function Tests: Recent Labs  Lab 01/07/22 0554  ALBUMIN 3.4*    No results for input(s): LIPASE, AMYLASE in the last 168 hours. No results for input(s): AMMONIA in the last 168 hours.  CBC: Recent Labs  Lab 01/07/22 0554  WBC 7.3  HGB 9.7*  HCT 29.2*  MCV 88.8  PLT 180     Cardiac Enzymes: No results for input(s): CKTOTAL, CKMB, CKMBINDEX, TROPONINI in the last 168 hours.  BNP: Invalid input(s): POCBNP  CBG: Recent Labs  Lab 01/12/22 0745 01/12/22 1148 01/12/22 1548 01/12/22 2005 01/13/22 0737  GLUCAP 102* 145* 221* 204* 85     Microbiology: Results for orders placed or performed during the hospital encounter of 01/03/22  Resp Panel by RT-PCR (Flu A&B, Covid) Nasopharyngeal Swab     Status: None   Collection Time: 01/03/22 12:31 AM   Specimen: Nasopharyngeal Swab; Nasopharyngeal(NP) swabs in vial transport medium  Result Value Ref Range Status   SARS Coronavirus 2 by RT PCR NEGATIVE NEGATIVE Final    Comment: (NOTE) SARS-CoV-2 target nucleic acids are NOT DETECTED.  The SARS-CoV-2 RNA is generally detectable in upper respiratory specimens during the acute phase of infection. The lowest concentration of SARS-CoV-2 viral copies this assay can detect is 138 copies/mL. A negative result does not preclude SARS-Cov-2 infection and should not be used as the sole basis for treatment or other patient management decisions. A negative result may occur with  improper specimen  collection/handling, submission of specimen other than nasopharyngeal swab, presence of viral mutation(s) within the areas targeted by this assay, and inadequate number of viral copies(<138 copies/mL). A negative result must be combined with clinical observations, patient history, and epidemiological information. The expected result is Negative.  Fact Sheet for Patients:   EntrepreneurPulse.com.au  Fact Sheet for Healthcare Providers:  IncredibleEmployment.be  This test is no t yet approved or cleared by the [place] FDA and  has been authorized for detection and/or diagnosis of SARS-CoV-2 by FDA under an Emergency Use Authorization (EUA). This EUA will remain  in effect (meaning this test can be used) for the duration of the COVID-19 declaration under Section 564(b)(1) of the Act, 21 U.S.C.section 360bbb-3(b)(1), unless the authorization is terminated  or revoked sooner.       Influenza A by PCR NEGATIVE NEGATIVE Final   Influenza B by PCR NEGATIVE NEGATIVE Final    Comment: (NOTE) The Xpert Xpress SARS-CoV-2/FLU/RSV plus assay is intended as an aid in the diagnosis of influenza from Nasopharyngeal swab specimens and should not be used as a sole basis for treatment. Nasal washings and aspirates are unacceptable for Xpert Xpress SARS-CoV-2/FLU/RSV testing.  Fact Sheet for Patients: EntrepreneurPulse.com.au  Fact Sheet for Healthcare Providers: IncredibleEmployment.be  This test is not yet approved or cleared by the [place] FDA and has been authorized for detection and/or diagnosis of SARS-CoV-2 by FDA under an Emergency Use Authorization (EUA). This EUA will remain in effect (meaning this test can be used) for the duration of the COVID-19 declaration under Section 564(b)(1) of the Act, 21 U.S.C. section 360bbb-3(b)(1), unless the authorization is terminated or revoked.  Performed at [facility], [address] [place], [place] [address]     Coagulation Studies: No results for input(s): LABPROT, INR in the last 72 hours.  Urinalysis: No results for input(s): COLORURINE, LABSPEC, PHURINE, GLUCOSEU, HGBUR, BILIRUBINUR, KETONESUR, PROTEINUR, UROBILINOGEN, NITRITE, LEUKOCYTESUR in the last 72 hours.  Invalid input(s): APPERANCEUR    Imaging: No  results found.   Medications:      albuterol  2.5 mg Nebulization TID   amLODipine  10 mg Oral Daily   apixaban  2.5 mg Oral BID   aspirin EC  81 mg Oral Daily   atorvastatin  80 mg Oral q1800   carvedilol  25 mg Oral BID   Chlorhexidine Gluconate Cloth  6 each Topical Q0600   escitalopram  10 mg Oral Daily   furosemide  40 mg Oral Once per day on Sun Tue Thu Sat   gabapentin  300 mg Oral QHS   insulin aspart  0-6 Units Subcutaneous TID WC   insulin detemir  12 Units Subcutaneous QHS   irbesartan  150 mg Oral Daily   midodrine  5 mg Oral Once per day on Mon Wed Fri   sevelamer carbonate  1,600 mg Oral TID WC   traZODone  50 mg Oral Once per day on Mon Wed Fri   acetaminophen **OR** acetaminophen, magnesium hydroxide, ondansetron **OR** ondansetron (ZOFRAN) IV, traZODone  Assessment/ Plan:  [name] is a 66 y.o.  female with a past medical history including diabetes, hyperlipidemia, hypertension, CHF, glottic stenosis status post chronic tracheostomy, and end-stage renal disease on dialysis.  Patient presents to the emergency department with complaints of shortness of breath.  Patient states she has missed the previous 2 dialysis treatments due to home health nurse inability to accompany patient to outpatient treatments.   [facility] [address]/MWF/Lt AVF/  64kg  Respiratory distress with end-stage renal disease on dialysis.    Currently receiving dialysis, tolerating well.  UF goal 2 L as tolerated.  Dialysis coordinator and case manager currently working to confirm outpatient arrangements.  Patient offered home hemodialysis, but son feels it would be too cumbersome.  Dialysis coordinator currently working to transfer patient to [facility] who is available to accept trach patient's.  Once this is confirmed, patient can be discharged.  2. Anemia of chronic kidney disease Lab Results  Component Value Date   HGB 9.7 (L) 01/07/2022  Hemoglobin within acceptable  range  3. Secondary Hyperparathyroidism:  Lab Results  Component Value Date   PTH 169 (H) 11/12/2020   CALCIUM 8.9 01/07/2022   CAION 1.09 (L) 09/01/2016   PHOS 4.0 01/07/2022  Calcium and phosphorus within acceptable range Continue sevelamer with meals   LOS: 9   2/10/20232:07 PM

## 2022-01-14 DIAGNOSIS — J810 Acute pulmonary edema: Secondary | ICD-10-CM | POA: Diagnosis not present

## 2022-01-14 LAB — GLUCOSE, CAPILLARY
Glucose-Capillary: 79 mg/dL (ref 70–99)
Glucose-Capillary: 126 mg/dL — ABNORMAL HIGH (ref 70–99)
Glucose-Capillary: 160 mg/dL — ABNORMAL HIGH (ref 70–99)
Glucose-Capillary: 214 mg/dL — ABNORMAL HIGH (ref 70–99)

## 2022-01-14 NOTE — Progress Notes (Signed)
Progress Note   Patient: Deborah [name] HER:[id number] DOB: [birth year] DOA: 01/03/2022     10 DOS: the patient was seen and examined on 01/14/2022   Brief hospital course: Deborah [name] is a 66 y.o. female with medical history significant for atrial fibrillation on Eliquis, type II DM, ESRD on hemodialysis on MWF, hypertension, dyslipidemia, glottic stenosis s/p tracheostomy, who presented to the hospital because of wheezing, worsening shortness of breath and productive cough after missing 2 outpatient hemodialysis sessions.  Patient has to be accompanied by a nurse to the dialysis unit but apparently the nurses have been calling out and so she had not been able to go to dialysis.  She was diagnosed with acute pulmonary edema/volume overload/acute on chronic diastolic CHF from missed hemodialysis.  She was emergently dialyzed in the emergency room with plan for discharge back home.  However, she became acutely short of breath with increasing oxygen requirement up to 5 L while waiting to be discharged from the emergency room.  She was subsequently admitted to the hospital for further management.  Discharge is pending arrangements for a nurse to attend dialysis with patient in outpatient setting for trach care.  Assessment and Plan: * Acute pulmonary edema ([place])- (present on admission) Emergently dialyzed in the ED. Continue volume management per dialysis  ESRD (end stage renal disease) ([place])- (present on admission) On dialysis MWF. Nephrology following. Discharge barrier: Patient requires nurse to attend outpatient dialysis for trach care.  Acute on chronic diastolic CHF (congestive heart failure) ([place])- (present on admission) Secondary to missing dialysis.  Continue Lasix and dialysis per nephrology.  Monitor volume status  Diabetic neuropathy ([place])- (present on admission) Continue gabapentin  Acute on chronic respiratory failure with hypoxemia ([place])- (present on admission) At baseline,  patient requires 3 L/min supplemental O2.  Currently with trach collar at 5 L/min, on room air at times with charted O2 sats 87 to 95%  Anemia of chronic disease- (present on admission) Hemoglobin stable  Diabetes mellitus type 2 in nonobese ([name]) Continue Levemir and sliding scale NovoLog  PAF (paroxysmal atrial fibrillation) ([place])- (present on admission) Heart rates overall controlled.  Continue Eliquis and Coreg  Hyperlipidemia- (present on admission) Continue Lipitor  Hypertension- (present on admission) Continue amlodipine, Coreg, Lasix, irbesartan        Subjective: Patient ambulating in room when seen this AM.  Reports feeling well and has no acute complaints.  Requests her son be updated about HD planning underway.  Physical Exam: Vitals:   01/14/22 0500 01/14/22 0734 01/14/22 0755 01/14/22 1355  BP:   131/65   Pulse:  64 66 68  Resp:  16 16 18   Temp:   98.5 F (36.9 C)   TempSrc:   Oral   SpO2:  92% 98% 93%  Weight: 71.1 kg     Height:       General exam: awake, alert, no acute distress HEENT: Tracheostomy present, moist mucus membranes, hearing grossly normal  Respiratory system: CTAB, no wheezes, rales or rhonchi, normal respiratory effort. Cardiovascular system: normal S1/S2, RRR, no pedal edema.   Central nervous system: A&O x3. no gross focal neurologic deficits, normal speech Extremities: Left upper extremity AV fistula, no edema, normal tone Psychiatry: normal mood, congruent affect, judgement and insight appear normal   Data Reviewed:  There are no new results to review at this time.  Family Communication: None  Disposition: Status is: Inpatient Remains inpatient appropriate because: Requires nurse for trach care at outpatient dialysis, arrangements for this  are underway       Planned Discharge Destination: Home     Time spent: 25 minutes  Author: Ezekiel Slocumb, DO 01/14/2022 1:57 PM  For on call review [email or web address].

## 2022-01-14 NOTE — Progress Notes (Signed)
[facility]  ROUNDING NOTE   Subjective:   Deborah [name] is a 66 year old female with a past medical history including diabetes, hyperlipidemia, hypertension, CHF, glottic stenosis status post chronic tracheostomy, and end-stage renal disease on dialysis.  Patient presents to the emergency department with complaints of shortness of breath.   Patient sitting up in chair Alert and oriented Denies pain and discomfort  Dialysis yesterday, tolerated well No lower extremity edema   Objective:  Vital signs in last 24 hours:  Temp:  [98.1 F (36.7 C)-98.9 F (37.2 C)] 98.5 F (36.9 C) (02/11 0755) Pulse Rate:  [64-79] 66 (02/11 0755) Resp:  [12-20] 16 (02/11 0755) BP: (114-147)/(55-90) 131/65 (02/11 0755) SpO2:  [90 %-100 %] 98 % (02/11 0755) FiO2 (%):  [28 %] 28 % (02/11 0734) Weight:  [71.1 kg] 71.1 kg (02/11 0500)  Weight change:  Filed Weights   01/12/22 0527 01/13/22 0846 01/14/22 0500  Weight: 70.6 kg 69.6 kg 71.1 kg    Intake/Output: I/O last 3 completed shifts: In: -  Out: 2501 [Urine:1; Other:2500]   Intake/Output this shift:  No intake/output data recorded.  Physical Exam: General: NAD  Head: Normocephalic, atraumatic. Moist oral mucosal membranes  Eyes: Anicteric  Lungs:  Clear to auscultation, normal effort, trach  Heart: Regular rate and rhythm  Abdomen:  Soft, nontender  Extremities: No peripheral edema  Neurologic: Nonfocal, moving all four extremities  Skin: No lesions  Access: Left aVF    Basic Metabolic Panel: No results for input(s): NA, K, CL, CO2, GLUCOSE, BUN, CREATININE, CALCIUM, MG, PHOS in the last 168 hours.   Liver Function Tests: No results for input(s): AST, ALT, ALKPHOS, BILITOT, PROT, ALBUMIN in the last 168 hours.  No results for input(s): LIPASE, AMYLASE in the last 168 hours. No results for input(s): AMMONIA in the last 168 hours.  CBC: No results for input(s): WBC, NEUTROABS, HGB, HCT, MCV, PLT in the last 168  hours.   Cardiac Enzymes: No results for input(s): CKTOTAL, CKMB, CKMBINDEX, TROPONINI in the last 168 hours.  BNP: Invalid input(s): POCBNP  CBG: Recent Labs  Lab 01/13/22 0737 01/13/22 1408 01/13/22 1545 01/13/22 2114 01/14/22 0757  GLUCAP 85 146* 144* 257* 79     Microbiology: Results for orders placed or performed during the hospital encounter of 01/03/22  Resp Panel by RT-PCR (Flu A&B, Covid) Nasopharyngeal Swab     Status: None   Collection Time: 01/03/22 12:31 AM   Specimen: Nasopharyngeal Swab; Nasopharyngeal(NP) swabs in vial transport medium  Result Value Ref Range Status   SARS Coronavirus 2 by RT PCR NEGATIVE NEGATIVE Final    Comment: (NOTE) SARS-CoV-2 target nucleic acids are NOT DETECTED.  The SARS-CoV-2 RNA is generally detectable in upper respiratory specimens during the acute phase of infection. The lowest concentration of SARS-CoV-2 viral copies this assay can detect is 138 copies/mL. A negative result does not preclude SARS-Cov-2 infection and should not be used as the sole basis for treatment or other patient management decisions. A negative result may occur with  improper specimen collection/handling, submission of specimen other than nasopharyngeal swab, presence of viral mutation(s) within the areas targeted by this assay, and inadequate number of viral copies(<138 copies/mL). A negative result must be combined with clinical observations, patient history, and epidemiological information. The expected result is Negative.  Fact Sheet for Patients:  EntrepreneurPulse.com.au  Fact Sheet for Healthcare Providers:  IncredibleEmployment.be  This test is no t yet approved or cleared by the [place] and  has been authorized for detection and/or diagnosis of SARS-CoV-2 by FDA under an Emergency Use Authorization (EUA). This EUA will remain  in effect (meaning this test can be used) for the duration of  the COVID-19 declaration under Section 564(b)(1) of the Act, 21 U.S.C.section 360bbb-3(b)(1), unless the authorization is terminated  or revoked sooner.       Influenza A by PCR NEGATIVE NEGATIVE Final   Influenza B by PCR NEGATIVE NEGATIVE Final    Comment: (NOTE) The Xpert Xpress SARS-CoV-2/FLU/RSV plus assay is intended as an aid in the diagnosis of influenza from Nasopharyngeal swab specimens and should not be used as a sole basis for treatment. Nasal washings and aspirates are unacceptable for Xpert Xpress SARS-CoV-2/FLU/RSV testing.  Fact Sheet for Patients: EntrepreneurPulse.com.au  Fact Sheet for Healthcare Providers: IncredibleEmployment.be  This test is not yet approved or cleared by the [place] FDA and has been authorized for detection and/or diagnosis of SARS-CoV-2 by FDA under an Emergency Use Authorization (EUA). This EUA will remain in effect (meaning this test can be used) for the duration of the COVID-19 declaration under Section 564(b)(1) of the Act, 21 U.S.C. section 360bbb-3(b)(1), unless the authorization is terminated or revoked.  Performed at [facility], [address] [place], [place] [address]     Coagulation Studies: No results for input(s): LABPROT, INR in the last 72 hours.  Urinalysis: No results for input(s): COLORURINE, LABSPEC, PHURINE, GLUCOSEU, HGBUR, BILIRUBINUR, KETONESUR, PROTEINUR, UROBILINOGEN, NITRITE, LEUKOCYTESUR in the last 72 hours.  Invalid input(s): APPERANCEUR    Imaging: No results found.   Medications:      albuterol  2.5 mg Nebulization TID   amLODipine  10 mg Oral Daily   apixaban  2.5 mg Oral BID   aspirin EC  81 mg Oral Daily   atorvastatin  80 mg Oral q1800   carvedilol  25 mg Oral BID   Chlorhexidine Gluconate Cloth  6 each Topical Q0600   escitalopram  10 mg Oral Daily   furosemide  40 mg Oral Once per day on Sun Tue Thu Sat   gabapentin  300 mg  Oral QHS   insulin aspart  0-6 Units Subcutaneous TID WC   insulin detemir  12 Units Subcutaneous QHS   irbesartan  150 mg Oral Daily   midodrine  5 mg Oral Once per day on Mon Wed Fri   sevelamer carbonate  1,600 mg Oral TID WC   traZODone  50 mg Oral Once per day on Mon Wed Fri   acetaminophen **OR** acetaminophen, magnesium hydroxide, ondansetron **OR** ondansetron (ZOFRAN) IV, traZODone  Assessment/ Plan:  [name] is a 66 y.o.  female with a past medical history including diabetes, hyperlipidemia, hypertension, CHF, glottic stenosis status post chronic tracheostomy, and end-stage renal disease on dialysis.  Patient presents to the emergency department with complaints of shortness of breath.  Patient states she has missed the previous 2 dialysis treatments due to home health nurse inability to accompany patient to outpatient treatments.   [facility] [address]/MWF/Lt AVF/ 64kg  Respiratory distress with end-stage renal disease on dialysis.    Dialysis yesterday, tolerated well. UF goal 2.5L achieved   Next treatment scheduled for Monday  Dialysis coordinator and case manager currently working to confirm outpatient arrangements.  Patient offered home hemodialysis, but son feels it would be too cumbersome. Dialysis coordinator currently working to transfer patient to [facility] who is available to accept trach patient's.  Once this is confirmed, patient can  be discharged.  2. Anemia of chronic kidney disease Lab Results  Component Value Date   HGB 9.7 (L) 01/07/2022  Will obtain updated labs with HD on Monday   3. Secondary Hyperparathyroidism:  Lab Results  Component Value Date   PTH 169 (H) 11/12/2020   CALCIUM 8.9 01/07/2022   CAION 1.09 (L) 09/01/2016   PHOS 4.0 01/07/2022  Calcium and phosphorus within acceptable range Continue sevelamer with meals   LOS: [place] 2/11/202310:20 AM

## 2022-01-15 DIAGNOSIS — J810 Acute pulmonary edema: Secondary | ICD-10-CM | POA: Diagnosis not present

## 2022-01-15 LAB — GLUCOSE, CAPILLARY
Glucose-Capillary: 76 mg/dL (ref 70–99)
Glucose-Capillary: 203 mg/dL — ABNORMAL HIGH (ref 70–99)
Glucose-Capillary: 197 mg/dL — ABNORMAL HIGH (ref 70–99)
Glucose-Capillary: 188 mg/dL — ABNORMAL HIGH (ref 70–99)

## 2022-01-15 LAB — CBC
HCT: 28.5 % — ABNORMAL LOW (ref 36.0–46.0)
Hemoglobin: 9.4 g/dL — ABNORMAL LOW (ref 12.0–15.0)
MCH: 29.8 pg (ref 26.0–34.0)
MCHC: 33 g/dL (ref 30.0–36.0)
MCV: 90.5 fL (ref 80.0–100.0)
Platelets: 219 10*3/uL (ref 150–400)
RBC: 3.15 MIL/uL — ABNORMAL LOW (ref 3.87–5.11)
RDW: 15.9 % — ABNORMAL HIGH (ref 11.5–15.5)
WBC: 8.2 10*3/uL (ref 4.0–10.5)
nRBC: 0 % (ref 0.0–0.2)

## 2022-01-15 NOTE — Progress Notes (Addendum)
[facility]  ROUNDING NOTE   Subjective:   Deborah [name] is a 66 year old female with a past medical history including diabetes, hyperlipidemia, hypertension, CHF, glottic stenosis status post chronic tracheostomy, and end-stage renal disease on dialysis.  Patient presents to the emergency department with complaints of shortness of breath.   Patient sitting at side of bed Son at bedside No complaints at this time  Dialysis scheduled for tomorrow   Objective:  Vital signs in last 24 hours:  Temp:  [97.8 F (36.6 C)-98.6 F (37 C)] 97.8 F (36.6 C) (02/12 0740) Pulse Rate:  [58-73] 63 (02/12 0740) Resp:  [15-20] 16 (02/12 0740) BP: (121-139)/(53-63) 139/63 (02/12 0740) SpO2:  [93 %-100 %] 97 % (02/12 0740) FiO2 (%):  [28 %] 28 % (02/12 0714)  Weight change:  Filed Weights   01/12/22 0527 01/13/22 0846 01/14/22 0500  Weight: 70.6 kg 69.6 kg 71.1 kg    Intake/Output: I/O last 3 completed shifts: In: 720 [P.O.:720] Out: -    Intake/Output this shift:  No intake/output data recorded.  Physical Exam: General: NAD  Head: Normocephalic, atraumatic. Moist oral mucosal membranes  Eyes: Anicteric  Lungs:  Basilar rhonchi, normal effort, trach  Heart: Regular rate and rhythm  Abdomen:  Soft, nontender  Extremities: No peripheral edema  Neurologic: Nonfocal, moving all four extremities  Skin: No lesions  Access: Left aVF    Basic Metabolic Panel: No results for input(s): NA, K, CL, CO2, GLUCOSE, BUN, CREATININE, CALCIUM, MG, PHOS in the last 168 hours.   Liver Function Tests: No results for input(s): AST, ALT, ALKPHOS, BILITOT, PROT, ALBUMIN in the last 168 hours.  No results for input(s): LIPASE, AMYLASE in the last 168 hours. No results for input(s): AMMONIA in the last 168 hours.  CBC: No results for input(s): WBC, NEUTROABS, HGB, HCT, MCV, PLT in the last 168 hours.   Cardiac Enzymes: No results for input(s): CKTOTAL, CKMB, CKMBINDEX, TROPONINI  in the last 168 hours.  BNP: Invalid input(s): POCBNP  CBG: Recent Labs  Lab 01/14/22 0757 01/14/22 1139 01/14/22 1612 01/14/22 2140 01/15/22 0741  GLUCAP 79 126* 160* 214* 76     Microbiology: Results for orders placed or performed during the hospital encounter of 01/03/22  Resp Panel by RT-PCR (Flu A&B, Covid) Nasopharyngeal Swab     Status: None   Collection Time: 01/03/22 12:31 AM   Specimen: Nasopharyngeal Swab; Nasopharyngeal(NP) swabs in vial transport medium  Result Value Ref Range Status   SARS Coronavirus 2 by RT PCR NEGATIVE NEGATIVE Final    Comment: (NOTE) SARS-CoV-2 target nucleic acids are NOT DETECTED.  The SARS-CoV-2 RNA is generally detectable in upper respiratory specimens during the acute phase of infection. The lowest concentration of SARS-CoV-2 viral copies this assay can detect is 138 copies/mL. A negative result does not preclude SARS-Cov-2 infection and should not be used as the sole basis for treatment or other patient management decisions. A negative result may occur with  improper specimen collection/handling, submission of specimen other than nasopharyngeal swab, presence of viral mutation(s) within the areas targeted by this assay, and inadequate number of viral copies(<138 copies/mL). A negative result must be combined with clinical observations, patient history, and epidemiological information. The expected result is Negative.  Fact Sheet for Patients:  EntrepreneurPulse.com.au  Fact Sheet for Healthcare Providers:  IncredibleEmployment.be  This test is no t yet approved or cleared by the [place] FDA and  has been authorized for detection and/or diagnosis of SARS-CoV-2 by FDA  under an Emergency Use Authorization (EUA). This EUA will remain  in effect (meaning this test can be used) for the duration of the COVID-19 declaration under Section 564(b)(1) of the Act, 21 U.S.C.section 360bbb-3(b)(1),  unless the authorization is terminated  or revoked sooner.       Influenza A by PCR NEGATIVE NEGATIVE Final   Influenza B by PCR NEGATIVE NEGATIVE Final    Comment: (NOTE) The Xpert Xpress SARS-CoV-2/FLU/RSV plus assay is intended as an aid in the diagnosis of influenza from Nasopharyngeal swab specimens and should not be used as a sole basis for treatment. Nasal washings and aspirates are unacceptable for Xpert Xpress SARS-CoV-2/FLU/RSV testing.  Fact Sheet for Patients: EntrepreneurPulse.com.au  Fact Sheet for Healthcare Providers: IncredibleEmployment.be  This test is not yet approved or cleared by the [place] FDA and has been authorized for detection and/or diagnosis of SARS-CoV-2 by FDA under an Emergency Use Authorization (EUA). This EUA will remain in effect (meaning this test can be used) for the duration of the COVID-19 declaration under Section 564(b)(1) of the Act, 21 U.S.C. section 360bbb-3(b)(1), unless the authorization is terminated or revoked.  Performed at [facility], [address] [place], [place] [address]     Coagulation Studies: No results for input(s): LABPROT, INR in the last 72 hours.  Urinalysis: No results for input(s): COLORURINE, LABSPEC, PHURINE, GLUCOSEU, HGBUR, BILIRUBINUR, KETONESUR, PROTEINUR, UROBILINOGEN, NITRITE, LEUKOCYTESUR in the last 72 hours.  Invalid input(s): APPERANCEUR    Imaging: No results found.   Medications:      albuterol  2.5 mg Nebulization TID   amLODipine  10 mg Oral Daily   apixaban  2.5 mg Oral BID   aspirin EC  81 mg Oral Daily   atorvastatin  80 mg Oral q1800   carvedilol  25 mg Oral BID   Chlorhexidine Gluconate Cloth  6 each Topical Q0600   escitalopram  10 mg Oral Daily   furosemide  40 mg Oral Once per day on Sun Tue Thu Sat   gabapentin  300 mg Oral QHS   insulin aspart  0-6 Units Subcutaneous TID WC   insulin detemir  12 Units  Subcutaneous QHS   irbesartan  150 mg Oral Daily   midodrine  5 mg Oral Once per day on Mon Wed Fri   sevelamer carbonate  1,600 mg Oral TID WC   traZODone  50 mg Oral Once per day on Mon Wed Fri   acetaminophen **OR** acetaminophen, magnesium hydroxide, ondansetron **OR** ondansetron (ZOFRAN) IV, traZODone  Assessment/ Plan:  Deborah [name] is a 66 y.o.  female with a past medical history including diabetes, hyperlipidemia, hypertension, CHF, glottic stenosis status post chronic tracheostomy, and end-stage renal disease on dialysis.  Patient presents to the emergency department with complaints of shortness of breath.  Patient states she has missed the previous 2 dialysis treatments due to home health nurse inability to accompany patient to outpatient treatments.   [facility] [address]/MWF/Lt AVF/ 64kg  Respiratory distress with end-stage renal disease on dialysis.    Dialysis yesterday, tolerated well. UF goal 2.5L achieved   Next treatment scheduled for Monday  Dialysis coordinator has began transfer application for [facility] Patient aware of proposed clinic change due to trach. [facility] in process of being trach certified and can manage patient. Patient aware that family must accompany her to treatments until this is completed. Once accepted into clinic, patient cleared to discharge.   2. Anemia of chronic kidney disease  Lab Results  Component Value Date   HGB 9.7 (L) 01/07/2022  Will obtain updated labs with HD on Monday   3. Secondary Hyperparathyroidism:  Lab Results  Component Value Date   PTH 169 (H) 11/12/2020   CALCIUM 8.9 01/07/2022   CAION 1.09 (L) 09/01/2016   PHOS 4.0 01/07/2022  Calcium and phosphorus within acceptable range Continue sevelamer with meals   LOS: [place] 2/12/202310:03 AM

## 2022-01-15 NOTE — Progress Notes (Signed)
Progress Note   Patient: Deborah [name] ZDG:[id number] DOB: 08/18/1956 DOA: 01/03/2022     11 DOS: the patient was seen and examined on 01/15/2022   Brief hospital course: SHIRLENA BRINEGAR is a 66 y.o. female with medical history significant for atrial fibrillation on Eliquis, type II DM, ESRD on hemodialysis on MWF, hypertension, dyslipidemia, glottic stenosis s/p tracheostomy, who presented to the hospital because of wheezing, worsening shortness of breath and productive cough after missing 2 outpatient hemodialysis sessions.  Patient has to be accompanied by a nurse to the dialysis unit but apparently the nurses have been calling out and so she had not been able to go to dialysis.  She was diagnosed with acute pulmonary edema/volume overload/acute on chronic diastolic CHF from missed hemodialysis.  She was emergently dialyzed in the emergency room with plan for discharge back home.  However, she became acutely short of breath with increasing oxygen requirement up to 5 L while waiting to be discharged from the emergency room.  She was subsequently admitted to the hospital for further management.  Discharge is pending arrangements for a nurse to attend dialysis with patient in outpatient setting for trach care.  Assessment and Plan: * Acute pulmonary edema ([place])- (present on admission) Emergently dialyzed in the ED. Continue volume management per dialysis  ESRD (end stage renal disease) ([place])- (present on admission) On dialysis MWF. Nephrology following. Discharge barrier: Patient requires nurse to attend outpatient dialysis for trach care.  Acute on chronic diastolic CHF (congestive heart failure) ([place])- (present on admission) Secondary to missing dialysis.  Continue Lasix and dialysis per nephrology.  Monitor volume status  Diabetic neuropathy ([place])- (present on admission) Continue gabapentin  Acute on chronic respiratory failure with hypoxemia ([place])- (present on admission) At baseline,  patient requires 3 L/min supplemental O2.  Currently with trach collar at 5 L/min, on room air at times with charted O2 sats 87 to 95%  Anemia of chronic disease- (present on admission) Hemoglobin stable  Diabetes mellitus type 2 in nonobese ([name]) Continue Levemir and sliding scale NovoLog  PAF (paroxysmal atrial fibrillation) ([place])- (present on admission) Heart rates overall controlled.  Continue Eliquis and Coreg  Hyperlipidemia- (present on admission) Continue Lipitor  Hypertension- (present on admission) Continue amlodipine, Coreg, Lasix, irbesartan        Subjective: Patient seen with son at bedside this AM.  Pt reports feeling well and has no acute complaints.  We discussed the plans underway for outpatient dialysis trach care.   Physical Exam: Vitals:   01/14/22 2034 01/15/22 0317 01/15/22 0714 01/15/22 0740  BP: (!) 123/53 (!) 121/58  139/63  Pulse: 70 62 (!) 58 63  Resp: 16  20 16   Temp: 98.4 F (36.9 C) 98.1 F (36.7 C)  97.8 F (36.6 C)  TempSrc:    Oral  SpO2: 94% 100% 96% 97%  Weight:      Height:       General exam: awake, alert, no acute distress HEENT: Tracheostomy present, moist mucus membranes, hearing grossly normal  Respiratory system: normal respiratory effort, on room air. Cardiovascular system: normal S1/S2, RRR, no pedal edema.   Central nervous system: A&O x3. no gross focal neurologic deficits, normal speech Extremities: Left upper extremity AV fistula, no edema, normal tone Psychiatry: normal mood, congruent affect, judgement and insight appear normal   Data Reviewed:  There are no new results to review at this time.  Family Communication: son [name] at bedside on rounds  Disposition: Status is: Inpatient Remains inpatient  appropriate because: Requires nurse for trach care at outpatient dialysis, arrangements for this are underway       Planned Discharge Destination: Home     Time spent: 25 minutes  Author: Ezekiel Slocumb, DO 01/15/2022 10:50 AM  For on call review [email or web address].

## 2022-01-16 DIAGNOSIS — J810 Acute pulmonary edema: Secondary | ICD-10-CM | POA: Diagnosis not present

## 2022-01-16 LAB — RENAL FUNCTION PANEL
Albumin: 3.4 g/dL — ABNORMAL LOW (ref 3.5–5.0)
Anion gap: 11 (ref 5–15)
BUN: 89 mg/dL — ABNORMAL HIGH (ref 8–23)
CO2: 23 mmol/L (ref 22–32)
Calcium: 8.9 mg/dL (ref 8.9–10.3)
Chloride: 95 mmol/L — ABNORMAL LOW (ref 98–111)
Creatinine, Ser: 9.92 mg/dL — ABNORMAL HIGH (ref 0.44–1.00)
GFR, Estimated: 4 mL/min — ABNORMAL LOW (ref >60)
Glucose, Bld: 119 mg/dL — ABNORMAL HIGH (ref 70–99)
Phosphorus: 4.1 mg/dL (ref 2.5–4.6)
Potassium: 4.7 mmol/L (ref 3.5–5.1)
Sodium: 129 mmol/L — ABNORMAL LOW (ref 135–145)

## 2022-01-16 LAB — CBC
HCT: 24.2 % — ABNORMAL LOW (ref 36.0–46.0)
Hemoglobin: 8.1 g/dL — ABNORMAL LOW (ref 12.0–15.0)
MCH: 29.5 pg (ref 26.0–34.0)
MCHC: 33.5 g/dL (ref 30.0–36.0)
MCV: 88 fL (ref 80.0–100.0)
Platelets: 191 10*3/uL (ref 150–400)
RBC: 2.75 MIL/uL — ABNORMAL LOW (ref 3.87–5.11)
RDW: 16.3 % — ABNORMAL HIGH (ref 11.5–15.5)
WBC: 7.9 10*3/uL (ref 4.0–10.5)
nRBC: 0 % (ref 0.0–0.2)

## 2022-01-16 LAB — GLUCOSE, CAPILLARY
Glucose-Capillary: 89 mg/dL (ref 70–99)
Glucose-Capillary: 169 mg/dL — ABNORMAL HIGH (ref 70–99)
Glucose-Capillary: 231 mg/dL — ABNORMAL HIGH (ref 70–99)

## 2022-01-16 NOTE — Progress Notes (Signed)
Received e-mail from [place] regarding patient treatments with trach. Per [facility], a caregiver is required to attend and be present in clinic at all treatments. However, caregiver can be a family member that is checked off by the hospital to do trach care. I have spoken with the patient and son to inform of this information. Had a long talk with son and provided four plausible options. Transfer to [facility], with family committing to go to treatments with patient after being checked off by the hospital. Return to [facility] with [name], if there are no other home heath agencies available.   Begin training to do Home Hemo at [facility]. Return to hospital for emergent dialysis, when needed.  Son and I both agree that the fourth is not a good option. He requested time to speak with his mother (patient) and sisters. I will follow up tomorrow after they have had time to discuss these options.

## 2022-01-16 NOTE — Progress Notes (Signed)
[facility]  ROUNDING NOTE   Subjective:   Deborah [name] is a 66 year old female with a past medical history including diabetes, hyperlipidemia, hypertension, CHF, glottic stenosis status post chronic tracheostomy, and end-stage renal disease on dialysis.  Patient presents to the emergency department with complaints of shortness of breath.   Patient seen and evaluated during dialysis   HEMODIALYSIS FLOWSHEET:  Blood Flow Rate (mL/min): 400 mL/min Arterial Pressure (mmHg): -140 mmHg Venous Pressure (mmHg): 190 mmHg Transmembrane Pressure (mmHg): 70 mmHg Ultrafiltration Rate (mL/min): 1000 mL/min Dialysate Flow Rate (mL/min): 500 ml/min Conductivity: Machine : 13.7 Conductivity: Machine : 13.7 Dialysis Fluid Bolus: Normal Saline Bolus Amount (mL): 250 mL Dialysate Change: Other (comment) (changed to 3K)  No complaints at this time   Objective:  Vital signs in last 24 hours:  Temp:  [98.1 F (36.7 C)-98.6 F (37 C)] 98.6 F (37 C) (02/13 1221) Pulse Rate:  [60-86] 76 (02/13 1309) Resp:  [12-19] 14 (02/13 1309) BP: (108-148)/(57-68) 124/60 (02/13 1221) SpO2:  [94 %-100 %] 100 % (02/13 1309) Weight:  [67.6 kg-71.4 kg] 67.6 kg (02/13 1221)  Weight change:  Filed Weights   01/16/22 0500 01/16/22 0908 01/16/22 1221  Weight: 71.4 kg 70 kg 67.6 kg    Intake/Output: I/O last 3 completed shifts: In: 37 [P.O.:720] Out: -    Intake/Output this shift:  Total I/O In: -  Out: 2500 [Other:2500]  Physical Exam: General: NAD  Head: Normocephalic, atraumatic. Moist oral mucosal membranes  Eyes: Anicteric  Lungs:  Basilar rhonchi, normal effort, trach  Heart: Regular rate and rhythm  Abdomen:  Soft, nontender  Extremities: No peripheral edema  Neurologic: Nonfocal, moving all four extremities  Skin: No lesions  Access: Left aVF    Basic Metabolic Panel: No results for input(s): NA, K, CL, CO2, GLUCOSE, BUN, CREATININE, CALCIUM, MG, PHOS in the last 168  hours.   Liver Function Tests: No results for input(s): AST, ALT, ALKPHOS, BILITOT, PROT, ALBUMIN in the last 168 hours.  No results for input(s): LIPASE, AMYLASE in the last 168 hours. No results for input(s): AMMONIA in the last 168 hours.  CBC: Recent Labs  Lab 01/15/22 1026 01/16/22 0928  WBC 8.2 7.9  HGB 9.4* 8.1*  HCT 28.5* 24.2*  MCV 90.5 88.0  PLT 219 191     Cardiac Enzymes: No results for input(s): CKTOTAL, CKMB, CKMBINDEX, TROPONINI in the last 168 hours.  BNP: Invalid input(s): POCBNP  CBG: Recent Labs  Lab 01/15/22 0741 01/15/22 1148 01/15/22 1657 01/15/22 1944 01/16/22 0830  GLUCAP 76 203* 197* 188* 18     Microbiology: Results for orders placed or performed during the hospital encounter of 01/03/22  Resp Panel by RT-PCR (Flu A&B, Covid) Nasopharyngeal Swab     Status: None   Collection Time: 01/03/22 12:31 AM   Specimen: Nasopharyngeal Swab; Nasopharyngeal(NP) swabs in vial transport medium  Result Value Ref Range Status   SARS Coronavirus 2 by RT PCR NEGATIVE NEGATIVE Final    Comment: (NOTE) SARS-CoV-2 target nucleic acids are NOT DETECTED.  The SARS-CoV-2 RNA is generally detectable in upper respiratory specimens during the acute phase of infection. The lowest concentration of SARS-CoV-2 viral copies this assay can detect is 138 copies/mL. A negative result does not preclude SARS-Cov-2 infection and should not be used as the sole basis for treatment or other patient management decisions. A negative result may occur with  improper specimen collection/handling, submission of specimen other than nasopharyngeal swab, presence of viral mutation(s) within the  areas targeted by this assay, and inadequate number of viral copies(<138 copies/mL). A negative result must be combined with clinical observations, patient history, and epidemiological information. The expected result is Negative.  Fact Sheet for Patients:   EntrepreneurPulse.com.au  Fact Sheet for Healthcare Providers:  IncredibleEmployment.be  This test is no t yet approved or cleared by the [place] FDA and  has been authorized for detection and/or diagnosis of SARS-CoV-2 by FDA under an Emergency Use Authorization (EUA). This EUA will remain  in effect (meaning this test can be used) for the duration of the COVID-19 declaration under Section 564(b)(1) of the Act, 21 U.S.C.section 360bbb-3(b)(1), unless the authorization is terminated  or revoked sooner.       Influenza A by PCR NEGATIVE NEGATIVE Final   Influenza B by PCR NEGATIVE NEGATIVE Final    Comment: (NOTE) The Xpert Xpress SARS-CoV-2/FLU/RSV plus assay is intended as an aid in the diagnosis of influenza from Nasopharyngeal swab specimens and should not be used as a sole basis for treatment. Nasal washings and aspirates are unacceptable for Xpert Xpress SARS-CoV-2/FLU/RSV testing.  Fact Sheet for Patients: EntrepreneurPulse.com.au  Fact Sheet for Healthcare Providers: IncredibleEmployment.be  This test is not yet approved or cleared by the [place] FDA and has been authorized for detection and/or diagnosis of SARS-CoV-2 by FDA under an Emergency Use Authorization (EUA). This EUA will remain in effect (meaning this test can be used) for the duration of the COVID-19 declaration under Section 564(b)(1) of the Act, 21 U.S.C. section 360bbb-3(b)(1), unless the authorization is terminated or revoked.  Performed at [facility], [address] [place], [place] [address]     Coagulation Studies: No results for input(s): LABPROT, INR in the last 72 hours.  Urinalysis: No results for input(s): COLORURINE, LABSPEC, PHURINE, GLUCOSEU, HGBUR, BILIRUBINUR, KETONESUR, PROTEINUR, UROBILINOGEN, NITRITE, LEUKOCYTESUR in the last 72 hours.  Invalid input(s): APPERANCEUR    Imaging: No  results found.   Medications:      albuterol  2.5 mg Nebulization TID   amLODipine  10 mg Oral Daily   apixaban  2.5 mg Oral BID   aspirin EC  81 mg Oral Daily   atorvastatin  80 mg Oral q1800   carvedilol  25 mg Oral BID   Chlorhexidine Gluconate Cloth  6 each Topical Q0600   escitalopram  10 mg Oral Daily   furosemide  40 mg Oral Once per day on Sun Tue Thu Sat   gabapentin  300 mg Oral QHS   insulin aspart  0-6 Units Subcutaneous TID WC   insulin detemir  12 Units Subcutaneous QHS   irbesartan  150 mg Oral Daily   midodrine  5 mg Oral Once per day on Mon Wed Fri   sevelamer carbonate  1,600 mg Oral TID WC   traZODone  50 mg Oral Once per day on Mon Wed Fri   acetaminophen **OR** acetaminophen, magnesium hydroxide, ondansetron **OR** ondansetron (ZOFRAN) IV, traZODone  Assessment/ Plan:  Deborah [name] is a 66 y.o.  female with a past medical history including diabetes, hyperlipidemia, hypertension, CHF, glottic stenosis status post chronic tracheostomy, and end-stage renal disease on dialysis.  Patient presents to the emergency department with complaints of shortness of breath.  Patient states she has missed the previous 2 dialysis treatments due to home health nurse inability to accompany patient to outpatient treatments.   [facility] [address]/MWF/Lt AVF/ 64kg  Respiratory distress with end-stage renal disease on dialysis.    Receiving  dialysis today, UF goal 2.5 L as tolerated.  Tolerating treatment well.  Patient with concerns that she may have gained weight over winter months, requesting an adjustment in dry weight.  We will make those request to the outpatient clinic.  Dialysis coordinator currently working on dialysis discharge plan.  Patient presented with options including transitioning to home hemodialysis, remaining at present dialysis clinic with certified trach staff accompanying her to each visit, or transferring to a new clinic which requires family members  accompany patient to each visit.  Family has voiced concerns about inability to perform home hemodialysis.  Patient and family are not pleased with current agency providing nursing to accompany her to each clinic visit, and we have been unable to find another agency to provide the services.  Family states they all work, and will be unable to accompany her to each clinic visit for dialysis.  Patient may result in coming to emergency department for emergent dialysis as needed.  2. Anemia of chronic kidney disease Lab Results  Component Value Date   HGB 8.1 (L) 01/16/2022  Hemoglobin below desired target, will order EPO with next treatment.  3. Secondary Hyperparathyroidism:  Lab Results  Component Value Date   PTH 169 (H) 11/12/2020   CALCIUM 8.9 01/07/2022   CAION 1.09 (L) 09/01/2016   PHOS 4.0 01/07/2022  Calcium and phosphorus within acceptable range Continue sevelamer with meals   LOS: 12   2/13/20231:15 PM

## 2022-01-16 NOTE — Progress Notes (Signed)
Patient completes 3 hemodialysis treatment without incident. AVG functions well, no excessive bleeding this session. Targeted UF met with 2.5 liter fluid removal. Patient return to assigned room, report given.

## 2022-01-16 NOTE — TOC Progression Note (Signed)
Transition of Care [facility]) - Progression Note    Patient Details  Name: Deborah [name] MRN: [id number] Date of Birth: 01/20/1956  Transition of Care [facility]) CM/SW [place], RN Phone Number: 01/16/2022, 3:03 PM  Clinical Narrative:    Notified by family that patient had worked with [employer] in [place] prior to [place] with staffing to assist with monitoring trach during HD.   LVMM for [facility] @ [phone] requesting callback to discuss possible staffing.         Expected Discharge Plan and Services                                                 Social Determinants of Health (SDOH) Interventions    Readmission Risk Interventions Readmission Risk Prevention Plan 11/04/2020 04/02/2020 10/18/2019  Transportation Screening Complete Complete Complete  Medication Review [employer]) (No Data) Complete -  PCP or Specialist appointment within 3-5 days of discharge Complete Complete Complete  HRI or Home Care Consult (No Data) Complete -  SW Recovery Care/Counseling Consult Complete - -  Palliative Care Screening Not Applicable - -  [place] Not Applicable Complete Not Applicable  Some recent data might be hidden

## 2022-01-16 NOTE — Progress Notes (Signed)
Progress Note   Patient: Deborah [name] OVF:[id number] DOB: 1956-06-03 DOA: 01/03/2022     12 DOS: the patient was seen and examined on 01/16/2022   Brief hospital course: Deborah [name] is a 66 y.o. female with medical history significant for atrial fibrillation on Eliquis, type II DM, ESRD on hemodialysis on MWF, hypertension, dyslipidemia, glottic stenosis s/p tracheostomy, who presented to the hospital because of wheezing, worsening shortness of breath and productive cough after missing 2 outpatient hemodialysis sessions.  Patient has to be accompanied by a nurse to the dialysis unit but apparently the nurses have been calling out and so she had not been able to go to dialysis.  She was diagnosed with acute pulmonary edema/volume overload/acute on chronic diastolic CHF from missed hemodialysis.  She was emergently dialyzed in the emergency room with plan for discharge back home.  However, she became acutely short of breath with increasing oxygen requirement up to 5 L while waiting to be discharged from the emergency room.  She was subsequently admitted to the hospital for further management.  Discharge is pending arrangements for a nurse to attend dialysis with patient in outpatient setting for trach care.  Assessment and Plan: * Acute pulmonary edema (Deborah [name])- (present on admission) Emergently dialyzed in the ED. Continue volume management per dialysis  ESRD (end stage renal disease) (Deborah [name])- (present on admission) On dialysis MWF. Nephrology following. Discharge barrier: Patient requires nurse to attend outpatient dialysis for trach care.  Acute on chronic diastolic CHF (congestive heart failure) (Deborah [name])- (present on admission) Secondary to missing dialysis.  Continue Lasix and dialysis per nephrology.  Monitor volume status  Diabetic neuropathy (Deborah [name])- (present on admission) Continue gabapentin  Acute on chronic respiratory failure with hypoxemia (Deborah [name])- (present on admission) At baseline,  patient requires 3 L/min supplemental O2.  Currently with trach collar at 5 L/min, on room air at times with charted O2 sats 87 to 95%  Anemia of chronic disease- (present on admission) Hemoglobin stable  Diabetes mellitus type 2 in nonobese (Deborah [name]) Continue Levemir and sliding scale NovoLog  PAF (paroxysmal atrial fibrillation) (Deborah [name])- (present on admission) Heart rates overall controlled.  Continue Eliquis and Coreg  Hyperlipidemia- (present on admission) Continue Lipitor  Hypertension- (present on admission) Continue amlodipine, Coreg, Lasix, irbesartan        Subjective: Patient seen during dialysis.  She was resting with eyes closed, woke easily.  Denies any acute issues or complaints.  No news regarding outpatient dialysis and trach care yet this AM.    Physical Exam: Vitals:   01/16/22 1145 01/16/22 1200 01/16/22 1215 01/16/22 1221  BP: (!) 126/58 (!) 108/58 (!) 110/59 124/60  Pulse: 68 70 69 68  Resp: 12 13 12 13   Temp:    98.6 F (37 C)  TempSrc:    Oral  SpO2: 100% 100% 100% 100%  Weight:    67.6 kg  Height:       General exam: awake, alert, no acute distress HEENT: Tracheostomy present, moist mucus membranes, hearing grossly normal  Respiratory system: normal respiratory effort, on room air. Cardiovascular system: normal S1/S2, RRR, no pedal edema.   Central nervous system: A&O x3. no gross focal neurologic deficits, normal speech Extremities: Left upper extremity AV fistula, no edema, normal tone Psychiatry: normal mood, congruent affect, judgement and insight appear normal   Data Reviewed:  There are no new results to review at this time.  Family Communication: son [name] at bedside on rounds 2/12  Disposition: Status is: Inpatient Remains inpatient  appropriate because: Requires nurse for trach care at outpatient dialysis, arrangements for this are underway       Planned Discharge Destination: Home     Time spent: 25  minutes  Author: Ezekiel Slocumb, DO 01/16/2022 1:08 PM  For on call review [email or web address].

## 2022-01-16 NOTE — TOC Progression Note (Signed)
Transition of Care [facility]) - Progression Note    Patient Details  Name: BERYL HORNBERGER MRN: [id number] Date of Birth: Mar 17, 1956  Transition of Care [facility]) CM/SW Lyn[place], RN Phone Number: 01/16/2022, 10:44 AM  Clinical Narrative:   As per Estill Bamberg at Dialysis, patient can go to dialysis with a caregiver from [facility] if this can be coordinated, family will have to go to another dialysis center, or patient will return to her previous setting with [facility].  Patient and family have stated that [name] is no longer an option for them.  Dialysis coordinator reviewed at home dialysis with patient, but son is not able to facilitate.  RNCM contacted [facility], as per [facility], they could accommodate patient, but the arrangement would be for $360 per visit private pay.  Message left for patient's daughter, awaiting callback.         Expected Discharge Plan and Services                                                 Social Determinants of Health (SDOH) Interventions    Readmission Risk Interventions Readmission Risk Prevention Plan 11/04/2020 04/02/2020 10/18/2019  Transportation Screening Complete Complete Complete  Medication Review Press photographer) (No Data) Complete -  PCP or Specialist appointment within 3-5 days of discharge Complete Complete Complete  HRI or Home Care Consult (No Data) Complete -  SW Recovery Care/Counseling Consult Complete - -  Palliative Care Screening Not Applicable - -  [place] Not Applicable Complete Not Applicable  Some recent data might be hidden

## 2022-01-17 LAB — GLUCOSE, CAPILLARY
Glucose-Capillary: 76 mg/dL (ref 70–99)
Glucose-Capillary: 120 mg/dL — ABNORMAL HIGH (ref 70–99)
Glucose-Capillary: 136 mg/dL — ABNORMAL HIGH (ref 70–99)

## 2022-01-17 MED ORDER — EPOETIN ALFA 10000 UNIT/ML IJ SOLN: 10000.0000 [IU] | INTRAMUSCULAR | Status: DC

## 2022-01-17 NOTE — Progress Notes (Signed)
°   01/16/22 1045 01/16/22 1100 01/16/22 1115  Vitals  Temp  --   --   --   Temp Source  --   --   --   BP 127/60 135/66 122/64  MAP (mmHg) 79 83 81  Pulse Rate 66 69 68  ECG Heart Rate 68 68 68  Resp  --  14 14  During Hemodialysis Assessment  Blood Flow Rate (mL/min) 400 mL/min 400 mL/min 400 mL/min  Arterial Pressure (mmHg) -130 mmHg -130 mmHg -130 mmHg  Venous Pressure (mmHg) 180 mmHg 190 mmHg 180 mmHg  Transmembrane Pressure (mmHg) 70 mmHg 70 mmHg 70 mmHg  Ultrafiltration Rate (mL/min) 1000 mL/min 1000 mL/min 1000 mL/min  Dialysate Flow Rate (mL/min) 500 ml/min 500 ml/min 500 ml/min  Conductivity: Machine  13.7 13.7 13.7  HD Safety Checks Performed Yes Yes Yes  Intra-Hemodialysis Comments Progressing as prescribed Progressing as prescribed Progressing as prescribed    01/16/22 1130 01/16/22 1145 01/16/22 1200  Vitals  Temp  --   --   --   Temp Source  --   --   --   BP 117/61 (!) 126/58 (!) 108/58  MAP (mmHg) 77 78 73  Pulse Rate 66 68 70  ECG Heart Rate 67 68 70  Resp 12 12 13   During Hemodialysis Assessment  Blood Flow Rate (mL/min) 400 mL/min 400 mL/min 400 mL/min  Arterial Pressure (mmHg) -130 mmHg -140 mmHg -140 mmHg  Venous Pressure (mmHg) 190 mmHg 200 mmHg 190 mmHg  Transmembrane Pressure (mmHg) 70 mmHg 70 mmHg 70 mmHg  Ultrafiltration Rate (mL/min) 1000 mL/min 1000 mL/min 1000 mL/min  Dialysate Flow Rate (mL/min) 500 ml/min 500 ml/min 500 ml/min  Conductivity: Machine  13.7 13.7 13.7  HD Safety Checks Performed Yes Yes Yes  Intra-Hemodialysis Comments Progressing as prescribed Progressing as prescribed Progressing as prescribed    01/16/22 1215 01/16/22 1221 01/16/22 1309  Vitals  Temp  --  98.6 F (37 C)  --   Temp Source  --  Oral  --   BP (!) 110/59 124/60  --   MAP (mmHg) 75 80  --   Pulse Rate 69 68 76  ECG Heart Rate 69 68 76  Resp 12 13 14   During Hemodialysis Assessment  Blood Flow Rate (mL/min) 400 mL/min 400 mL/min  --   Arterial Pressure  (mmHg) -140 mmHg -140 mmHg  --   Venous Pressure (mmHg) 190 mmHg 190 mmHg  --   Transmembrane Pressure (mmHg) 70 mmHg 70 mmHg  --   Ultrafiltration Rate (mL/min) 1000 mL/min 1000 mL/min  --   Dialysate Flow Rate (mL/min) 500 ml/min 500 ml/min  --   Conductivity: Machine  13.7 13.7  --   HD Safety Checks Performed Yes Yes  --   Intra-Hemodialysis Comments Progressing as prescribed Tx completed  --

## 2022-01-17 NOTE — Progress Notes (Addendum)
[facility]  ROUNDING NOTE   Subjective:   Deborah [name] is a 66 year old female with a past medical history including diabetes, hyperlipidemia, hypertension, CHF, glottic stenosis status post chronic tracheostomy, and end-stage renal disease on dialysis.  Patient presents to the emergency department with complaints of shortness of breath.   Patient sitting at side of bed No complaints at this time  Spoke with medical staffing agency, they are able to provide a nurse on Monday and Wednesday, but pursuing coverage for Friday. Patient states they are suppose to call her today.   Dialysis yesterday, tolerated well  Objective:  Vital signs in last 24 hours:  Temp:  [98.2 F (36.8 C)-98.6 F (37 C)] 98.2 F (36.8 C) (02/14 0728) Pulse Rate:  [65-82] 67 (02/14 0728) Resp:  [12-18] 17 (02/14 0728) BP: (108-131)/(56-62) 131/62 (02/14 0728) SpO2:  [94 %-100 %] 99 % (02/14 0745) FiO2 (%):  [28 %] 28 % (02/14 0745) Weight:  [67.6 kg-68 kg] 68 kg (02/14 0500)  Weight change: -1.4 kg Filed Weights   01/16/22 0908 01/16/22 1221 01/17/22 0500  Weight: 70 kg 67.6 kg 68 kg    Intake/Output: I/O last 3 completed shifts: In: 0  Out: 2500 [Other:2500]   Intake/Output this shift:  Total I/O In: 240 [P.O.:240] Out: -   Physical Exam: General: NAD  Head: Normocephalic, atraumatic. Moist oral mucosal membranes  Eyes: Anicteric  Lungs:  Basilar rhonchi, normal effort, trach  Heart: Regular rate and rhythm  Abdomen:  Soft, nontender  Extremities: No peripheral edema  Neurologic: Nonfocal, moving all four extremities  Skin: No lesions  Access: Left aVF    Basic Metabolic Panel: Recent Labs  Lab 01/16/22 0928  NA 129*  K 4.7  CL 95*  CO2 23  GLUCOSE 119*  BUN 89*  CREATININE 9.92*  CALCIUM 8.9  PHOS 4.1     Liver Function Tests: Recent Labs  Lab 01/16/22 0928  ALBUMIN 3.4*    No results for input(s): LIPASE, AMYLASE in the last 168 hours. No results  for input(s): AMMONIA in the last 168 hours.  CBC: Recent Labs  Lab 01/15/22 1026 01/16/22 0928  WBC 8.2 7.9  HGB 9.4* 8.1*  HCT 28.5* 24.2*  MCV 90.5 88.0  PLT 219 191     Cardiac Enzymes: No results for input(s): CKTOTAL, CKMB, CKMBINDEX, TROPONINI in the last 168 hours.  BNP: Invalid input(s): POCBNP  CBG: Recent Labs  Lab 01/16/22 0830 01/16/22 1621 01/16/22 2056 01/17/22 0727 01/17/22 1145  GLUCAP 89 169* 231* 76 120*     Microbiology: Results for orders placed or performed during the hospital encounter of 01/03/22  Resp Panel by RT-PCR (Flu A&B, Covid) Nasopharyngeal Swab     Status: None   Collection Time: 01/03/22 12:31 AM   Specimen: Nasopharyngeal Swab; Nasopharyngeal(NP) swabs in vial transport medium  Result Value Ref Range Status   SARS Coronavirus 2 by RT PCR NEGATIVE NEGATIVE Final    Comment: (NOTE) SARS-CoV-2 target nucleic acids are NOT DETECTED.  The SARS-CoV-2 RNA is generally detectable in upper respiratory specimens during the acute phase of infection. The lowest concentration of SARS-CoV-2 viral copies this assay can detect is 138 copies/mL. A negative result does not preclude SARS-Cov-2 infection and should not be used as the sole basis for treatment or other patient management decisions. A negative result may occur with  improper specimen collection/handling, submission of specimen other than nasopharyngeal swab, presence of viral mutation(s) within the areas targeted by this assay,  and inadequate number of viral copies(<138 copies/mL). A negative result must be combined with clinical observations, patient history, and epidemiological information. The expected result is Negative.  Fact Sheet for Patients:  EntrepreneurPulse.com.au  Fact Sheet for Healthcare Providers:  IncredibleEmployment.be  This test is no t yet approved or cleared by the [place] FDA and  has been authorized for  detection and/or diagnosis of SARS-CoV-2 by FDA under an Emergency Use Authorization (EUA). This EUA will remain  in effect (meaning this test can be used) for the duration of the COVID-19 declaration under Section 564(b)(1) of the Act, 21 U.S.C.section 360bbb-3(b)(1), unless the authorization is terminated  or revoked sooner.       Influenza A by PCR NEGATIVE NEGATIVE Final   Influenza B by PCR NEGATIVE NEGATIVE Final    Comment: (NOTE) The Xpert Xpress SARS-CoV-2/FLU/RSV plus assay is intended as an aid in the diagnosis of influenza from Nasopharyngeal swab specimens and should not be used as a sole basis for treatment. Nasal washings and aspirates are unacceptable for Xpert Xpress SARS-CoV-2/FLU/RSV testing.  Fact Sheet for Patients: EntrepreneurPulse.com.au  Fact Sheet for Healthcare Providers: IncredibleEmployment.be  This test is not yet approved or cleared by the [place] FDA and has been authorized for detection and/or diagnosis of SARS-CoV-2 by FDA under an Emergency Use Authorization (EUA). This EUA will remain in effect (meaning this test can be used) for the duration of the COVID-19 declaration under Section 564(b)(1) of the Act, 21 U.S.C. section 360bbb-3(b)(1), unless the authorization is terminated or revoked.  Performed at [facility], [address] [place], [place] [address]     Coagulation Studies: No results for input(s): LABPROT, INR in the last 72 hours.  Urinalysis: No results for input(s): COLORURINE, LABSPEC, PHURINE, GLUCOSEU, HGBUR, BILIRUBINUR, KETONESUR, PROTEINUR, UROBILINOGEN, NITRITE, LEUKOCYTESUR in the last 72 hours.  Invalid input(s): APPERANCEUR    Imaging: No results found.   Medications:      albuterol  2.5 mg Nebulization TID   amLODipine  10 mg Oral Daily   apixaban  2.5 mg Oral BID   aspirin EC  81 mg Oral Daily   atorvastatin  80 mg Oral q1800   carvedilol  25 mg  Oral BID   Chlorhexidine Gluconate Cloth  6 each Topical Q0600   escitalopram  10 mg Oral Daily   furosemide  40 mg Oral Once per day on Sun Tue Thu Sat   gabapentin  300 mg Oral QHS   insulin aspart  0-6 Units Subcutaneous TID WC   insulin detemir  12 Units Subcutaneous QHS   irbesartan  150 mg Oral Daily   midodrine  5 mg Oral Once per day on Mon Wed Fri   sevelamer carbonate  1,600 mg Oral TID WC   traZODone  50 mg Oral Once per day on Mon Wed Fri   acetaminophen **OR** acetaminophen, magnesium hydroxide, ondansetron **OR** ondansetron (ZOFRAN) IV, traZODone  Assessment/ Plan:  [name] is a 66 y.o.  female with a past medical history including diabetes, hyperlipidemia, hypertension, CHF, glottic stenosis status post chronic tracheostomy, and end-stage renal disease on dialysis.  Patient presents to the emergency department with complaints of shortness of breath.  Patient states she has missed the previous 2 dialysis treatments due to home health nurse inability to accompany patient to outpatient treatments.   [facility] [address]/MWF/Lt AVF/ 64kg  Respiratory distress with end-stage renal disease on dialysis.    Received dialysis yesterday, tolerated well. UF  goal 2.5L achieved. Patient requesting to increase dry weight, due to weight gain over winter months. Suggested dry weight of 67.5kg  Agency seeking chaperone to accompany paient on Friday's to treatment. Patient will be discharged today to resume outpatient treatments tomorrow. Advised to come to ED if she misses a treatment and needs urgent dialysis.  `   2. Anemia of chronic kidney disease Lab Results  Component Value Date   HGB 8.1 (L) 01/16/2022  Hgb not at target, will order EPO with treatments  3. Secondary Hyperparathyroidism:  Lab Results  Component Value Date   PTH 169 (H) 11/12/2020   CALCIUM 8.9 01/16/2022   CAION 1.09 (L) 09/01/2016   PHOS 4.1 01/16/2022  Will continue to monitor bone minerals during  this admission Continue sevelamer with meals   LOS: [place] 2/14/202311:53 AM

## 2022-01-17 NOTE — TOC Progression Note (Signed)
Transition of Care [facility]) - Progression Note    Patient Details  Name: Deborah [name] MRN: [id number] Date of Birth: May 20, 1956  [facility]) CM/SW [place], RN Phone Number: 01/17/2022, 1:56 PM  Clinical Narrative:    Damaris Schooner to [facility] [place] @ [phone] reports [name] has confirmed nurse-[name] will be attending HD treatments with patient on Monday, Wednesday and Friday. [name] will be able to resume services tomorrow.   Updated HD navigator of [place] capability.         Expected Discharge Plan and Services                                                 Social Determinants of Health (SDOH) Interventions    Readmission Risk Interventions Readmission Risk Prevention Plan 11/04/2020 04/02/2020 10/18/2019  Transportation Screening Complete Complete Complete  Medication Review Press photographer) (No Data) Complete -  PCP or Specialist appointment within 3-5 days of discharge Complete Complete Complete  HRI or Home Care Consult (No Data) Complete -  SW Recovery Care/Counseling Consult Complete - -  Palliative Care Screening Not Applicable - -  [place] Not Applicable Complete Not Applicable  Some recent data might be hidden

## 2022-01-17 NOTE — Care Management Important Message (Signed)
Important Message  Patient Details  Name: Deborah [name] MRN: [id number] Date of Birth: [birth year]   Medicare Important Message Given:  Yes     Juliann Pulse A Makaley Storts 01/17/2022, 2:27 PM

## 2022-01-17 NOTE — Progress Notes (Signed)
°   01/16/22 0919 01/16/22 0930 01/16/22 0945  Vitals  BP 126/64 (!) 148/66 134/68  MAP (mmHg) 84 90 85  BP Location Right Arm  --   --   BP Method Automatic  --   --   Pulse Rate 64 63 65  ECG Heart Rate 65 63 65  Resp 15 17 16   During Hemodialysis Assessment  Blood Flow Rate (mL/min) 400 mL/min 400 mL/min 400 mL/min  Arterial Pressure (mmHg) -110 mmHg -110 mmHg -110 mmHg  Venous Pressure (mmHg) 170 mmHg 170 mmHg 170 mmHg  Transmembrane Pressure (mmHg) 70 mmHg 70 mmHg 70 mmHg  Ultrafiltration Rate (mL/min) 1000 mL/min 1000 mL/min 1000 mL/min  Dialysate Flow Rate (mL/min) 500 ml/min 500 ml/min 500 ml/min  Conductivity: Machine  13.7 13.7 13.7  HD Safety Checks Performed Yes Yes Yes  Dialysis Fluid Bolus Normal Saline  --   --   Bolus Amount (mL) 250 mL  --   --   Intra-Hemodialysis Comments Tx initiated Progressing as prescribed Progressing as prescribed    01/16/22 1000 01/16/22 1015 01/16/22 1030  Vitals  BP (!) 130/57 (!) 117/58 124/62  MAP (mmHg) 76 75 81  BP Location  --   --   --   BP Method  --   --   --   Pulse Rate 63 64 67  ECG Heart Rate 64 68 68  Resp 17 17  --   During Hemodialysis Assessment  Blood Flow Rate (mL/min) 400 mL/min 400 mL/min 400 mL/min  Arterial Pressure (mmHg) -130 mmHg -120 mmHg -130 mmHg  Venous Pressure (mmHg) 180 mmHg 180 mmHg 180 mmHg  Transmembrane Pressure (mmHg) 70 mmHg 70 mmHg 70 mmHg  Ultrafiltration Rate (mL/min) 1000 mL/min 1000 mL/min 1000 mL/min  Dialysate Flow Rate (mL/min) 500 ml/min 500 ml/min 500 ml/min  Conductivity: Machine  13.7 13.7 13.7  HD Safety Checks Performed Yes Yes Yes  Dialysis Fluid Bolus  --   --   --   Bolus Amount (mL)  --   --   --   Intra-Hemodialysis Comments Progressing as prescribed Progressing as prescribed Progressing as prescribed

## 2022-01-17 NOTE — Progress Notes (Signed)
Met with patient again to confirm discharge plan. While in room patient called [name] on speaker and it was confirmed that [name] does have a nurse to cover outpatient dialysis treatments for Mondays and Wednesdays. However, were still in the process of securing a nurse for Fridays. [name] stated "We will try to get a nurse by Friday". Discussed a plan, if a nurse is not available for Friday treatment. Son and Patient are in agreement, to return to hospital if feeling short of breath. Son is able to come and pick patient up tonight around 7pm. I reminded patient to contact transportation for a pick up in the morning for treatment. I informed [place] that patient will be at treatment in the morning and that nephrology requested that patient dry weight be increased to 67.5kg.

## 2022-01-17 NOTE — Discharge Summary (Signed)
Physician Discharge Summary   Patient: Deborah [name] MRN: [id number] DOB: 01-11-1956  Admit date:     01/03/2022  Discharge date: 02/01/22  Discharge Physician: Ezekiel Slocumb   PCP: Leta Speller, MD   Recommendations at discharge:    Follow up with nephrology for outpatient dialysis Follow up with PCP in 1-2 weeks Repeat labs in 1-2 weeks, or per nephrology  Discharge Diagnoses: Principal Problem:   Acute pulmonary edema ([place]) Active Problems:   ESRD (end stage renal disease) ([place])   Acute on chronic diastolic CHF (congestive heart failure) ([name])   Hypertension   Hyperlipidemia   PAF (paroxysmal atrial fibrillation) ([place])   Diabetes mellitus type 2 in nonobese ([place])   Anemia of chronic disease   Acute on chronic respiratory failure with hypoxemia ([place])   Diabetic neuropathy [facility])    Hospital Course: Deborah [name] is a 66 y.o. female with medical history significant for atrial fibrillation on Eliquis, type II DM, ESRD on hemodialysis on MWF, hypertension, dyslipidemia, glottic stenosis s/p tracheostomy, who presented to the hospital because of wheezing, worsening shortness of breath and productive cough after missing 2 outpatient hemodialysis sessions.  Patient has to be accompanied by a nurse to the dialysis unit but apparently the nurses have been calling out and so she had not been able to go to dialysis.  She was diagnosed with acute pulmonary edema/volume overload/acute on chronic diastolic CHF from missed hemodialysis.  She was emergently dialyzed in the emergency room with plan for discharge back home.  However, she became acutely short of breath with increasing oxygen requirement up to 5 L while waiting to be discharged from the emergency room.  She was subsequently admitted to the hospital for further management.  Patient stable for discharge and continuing outpatient dialysis with home health nursing to accompany for trach care at the dialysis  center.  Assessment and Plan: * Acute pulmonary edema ([place])- (present on admission) Emergently dialyzed in the ED. Continue volume management per dialysis  ESRD (end stage renal disease) ([place])- (present on admission) On dialysis MWF. Nephrology following. Discharge barrier: Patient requires nurse to attend outpatient dialysis for trach care.  Acute on chronic diastolic CHF (congestive heart failure) ([place])- (present on admission) Secondary to missing dialysis.  Continue Lasix and dialysis per nephrology.  Monitor volume status  Diabetic neuropathy ([place])- (present on admission) Continue gabapentin  Acute on chronic respiratory failure with hypoxemia ([place])- (present on admission) At baseline, patient requires 3 L/min supplemental O2.  Currently with trach collar at 5 L/min, on room air at times with charted O2 sats 87 to 95%  Anemia of chronic disease- (present on admission) Hemoglobin stable  Diabetes mellitus type 2 in nonobese ([name]) Continue Levemir and sliding scale NovoLog  PAF (paroxysmal atrial fibrillation) ([place])- (present on admission) Heart rates overall controlled.  Continue Eliquis and Coreg  Hyperlipidemia- (present on admission) Continue Lipitor  Hypertension- (present on admission) Continue amlodipine, Coreg, Lasix, irbesartan           Consultants: Nephrology Procedures performed: Hemodialysis Disposition: Home health Diet recommendation:  Discharge Diet Orders (From admission, onward)     Start     Ordered   01/17/22 0000  Diet - low sodium heart healthy        01/17/22 1510           Renal diet  DISCHARGE MEDICATION: Allergies as of 01/17/2022   No Known Allergies      Medication List     STOP taking  these medications    multivitamin Tabs tablet       TAKE these medications    acetaminophen 325 MG tablet Commonly known as: TYLENOL Take 650 mg by mouth every 6 (six) hours as needed for mild pain or fever.   albuterol (2.5  MG/3ML) 0.083% nebulizer solution Commonly known as: PROVENTIL Take 3 mLs (2.5 mg total) by nebulization 4 (four) times daily - after meals and at bedtime. What changed:  when to take this reasons to take this   amLODipine 10 MG tablet Commonly known as: NORVASC Take 10 mg by mouth daily.   apixaban 2.5 MG Tabs tablet Commonly known as: ELIQUIS Take 2.5 mg by mouth 2 (two) times daily.   aspirin 81 MG EC tablet Take 1 tablet (81 mg total) by mouth daily.   atorvastatin 80 MG tablet Commonly known as: LIPITOR Take 80 mg by mouth daily at 6 PM.   carvedilol 25 MG tablet Commonly known as: COREG Take 25 mg by mouth 2 (two) times daily.   escitalopram 10 MG tablet Commonly known as: LEXAPRO Take 10 mg by mouth daily.   furosemide 40 MG tablet Commonly known as: LASIX Take 40 mg by mouth daily. ON NON dialysis days, Sunday, Tuesday, Thursday, Saturday   gabapentin 300 MG capsule Commonly known as: NEURONTIN Take 1 capsule (300 mg total) by mouth at bedtime.   Levemir FlexTouch 100 UNIT/ML FlexPen Generic drug: insulin detemir Inject 13 Units into the skin at bedtime.   lidocaine-prilocaine cream Commonly known as: EMLA Apply 1 application topically every dialysis.   midodrine 5 MG tablet Commonly known as: PROAMATINE Take 5 mg by mouth 3 (three) times a week.   olmesartan 20 MG tablet Commonly known as: BENICAR Take 20 mg by mouth daily.   pantoprazole 40 MG tablet Commonly known as: PROTONIX Take 1 tablet (40 mg total) by mouth 2 (two) times daily.   sevelamer carbonate 800 MG tablet Commonly known as: RENVELA Take 1,600 mg by mouth 3 (three) times daily with meals.   traZODone 50 MG tablet Commonly known as: DESYREL Take 50 mg by mouth 3 (three) times a week. Take on Dialysis days        Follow-up Information     Leta Speller, MD .   Specialty: Internal Medicine Why: As needed Contact information: [address] [place]   [address] [phone]                 Discharge Exam: [name] Weights   01/16/22 0908 01/16/22 1221 01/17/22 0500  Weight: 70 kg 67.6 kg 68 kg   General exam: awake, alert, no acute distress HEENT: tracheostomy present, moist mucus membranes, hearing grossly normal  Respiratory system: normal respiratory effort, trach collar at 5 L/min. Cardiovascular system: normal S1/S2, RRR, no pedal edema.   Central nervous system: A&O x3. no gross focal neurologic deficits, normal speech Extremities: moves all, no edema, normal tone Skin: dry, intact, normal temperature Psychiatry: normal mood, congruent affect, judgement and insight appear normal   Condition at discharge: stable  The results of significant diagnostics from this hospitalization (including imaging, microbiology, ancillary and laboratory) are listed below for reference.   Imaging Studies: DG Chest 2 View  Result Date: 01/03/2022 CLINICAL DATA:  Shortness of breath EXAM: CHEST - 2 VIEW COMPARISON:  11/10/2020 FINDINGS: Tracheostomy tube is again noted in satisfactory position. Postsurgical changes are again seen. Cardiac shadow is enlarged. Mild central vascular congestion with is seen with minimal interstitial edema. No  focal infiltrate or sizable effusion is noted. No bony abnormality is seen. IMPRESSION: Changes of mild CHF. This may be related to recently missed dialysis therapy. Electronically Signed   By: Inez Catalina M.D.   On: 01/03/2022 00:56   DG Chest Port 1 View  Result Date: 01/04/2022 CLINICAL DATA:  Increasing hypoxia EXAM: PORTABLE CHEST 1 VIEW COMPARISON:  01/03/22 FINDINGS: Cardiac shadow is enlarged but stable. Postsurgical changes are noted. Tracheostomy tube is noted in satisfactory position. Some increasing vascular congestion is noted without significant interstitial edema. No bony abnormality is noted. IMPRESSION: Worsening changes of CHF. Electronically Signed   By: Inez Catalina M.D.   On: 01/04/2022 02:00    ECHOCARDIOGRAM COMPLETE  Result Date: 01/04/2022    ECHOCARDIOGRAM REPORT   Patient Name:   INDIRA SORENSON Date of Exam: 01/04/2022 Medical Rec #:  [id number]     Height:       62.0 in Accession #:    [id number]    Weight:       147.9 lb Date of Birth:  1956/06/07     BSA:          1.682 m Patient Age:    81 years      BP:           93/53 mmHg Patient Gender: F             HR:           66 bpm. Exam Location:  [facility] Procedure: 2D Echo, Color Doppler and Cardiac Doppler Indications:     I50.31 congestive heart failure-Acute Diastolic  History:         Patient has prior history of Echocardiogram examinations. ESRD;                  Risk Factors:Hypertension, Diabetes and Dyslipidemia.  Sonographer:     Charmayne Sheer Referring Phys:  [id number] JAN A MANSY Diagnosing Phys: Kate Sable MD  Sonographer Comments: Suboptimal subcostal window. IMPRESSIONS  1. Left ventricular ejection fraction, by estimation, is 55%. The left ventricle has normal function. The left ventricle has no regional wall motion abnormalities. Left ventricular diastolic parameters are consistent with Grade II diastolic dysfunction (pseudonormalization).  2. Right ventricular systolic function is normal. The right ventricular size is normal.  3. The mitral valve has been repaired/replaced. No evidence of mitral valve regurgitation. The mean mitral valve gradient is 6.0 mmHg. Echo findings are consistent with normal structure and function of the mitral valve prosthesis.  4. The aortic valve is calcified. Aortic valve regurgitation is not visualized. Aortic valve sclerosis/calcification is present, without any evidence of aortic stenosis.  5. The inferior vena cava is normal in size with greater than 50% respiratory variability, suggesting right atrial pressure of 3 mmHg. FINDINGS  Left Ventricle: Left ventricular ejection fraction, by estimation, is 55%. The left ventricle has normal function. The left ventricle has no regional wall motion  abnormalities. The left ventricular internal cavity size was normal in size. There is no left ventricular hypertrophy. Left ventricular diastolic parameters are consistent with Grade II diastolic dysfunction (pseudonormalization). Right Ventricle: The right ventricular size is normal. No increase in right ventricular wall thickness. Right ventricular systolic function is normal. Left Atrium: Left atrial size was normal in size. Right Atrium: Right atrial size was normal in size. Pericardium: There is no evidence of pericardial effusion. Mitral Valve: The mitral valve has been repaired/replaced. No evidence of mitral valve regurgitation. There is a bioprosthetic valve present in  the mitral position. Echo findings are consistent with normal structure and function of the mitral valve prosthesis. MV peak gradient, 12.4 mmHg. The mean mitral valve gradient is 6.0 mmHg. Tricuspid Valve: The tricuspid valve is normal in structure. Tricuspid valve regurgitation is trivial. Aortic Valve: The aortic valve is calcified. Aortic valve regurgitation is not visualized. Aortic valve sclerosis/calcification is present, without any evidence of aortic stenosis. Aortic valve mean gradient measures 6.0 mmHg. Aortic valve peak gradient measures 10.9 mmHg. Aortic valve area, by VTI measures 1.61 cm. Pulmonic Valve: The pulmonic valve was not well visualized. Pulmonic valve regurgitation is not visualized. Aorta: The aortic root is normal in size and structure. Venous: The inferior vena cava is normal in size with greater than 50% respiratory variability, suggesting right atrial pressure of 3 mmHg. IAS/Shunts: No atrial level shunt detected by color flow Doppler.  LEFT VENTRICLE PLAX 2D LVIDd:         4.71 cm   Diastology LVIDs:         2.54 cm   LV e' medial:    4.79 cm/s LV PW:         1.03 cm   LV E/e' medial:  28.8 LV IVS:        0.96 cm   LV e' lateral:   3.81 cm/s LVOT diam:     2.10 cm   LV E/e' lateral: 36.2 LV SV:         54 LV  SV Index:   32 LVOT Area:     3.46 cm  RIGHT VENTRICLE RV Basal diam:  3.25 cm LEFT ATRIUM           Index        RIGHT ATRIUM           Index LA diam:      2.60 cm 1.55 cm/m   RA Area:     14.00 cm LA Vol (A4C): 36.2 ml 21.53 ml/m  RA Volume:   28.90 ml  17.18 ml/m  AORTIC VALVE                     PULMONIC VALVE AV Area (Vmax):    1.51 cm      PV Vmax:       0.77 m/s AV Area (Vmean):   1.52 cm      PV Vmean:      56.200 cm/s AV Area (VTI):     1.61 cm      PV VTI:        0.172 m AV Vmax:           165.00 cm/s   PV Peak grad:  2.3 mmHg AV Vmean:          115.000 cm/s  PV Mean grad:  1.0 mmHg AV VTI:            0.338 m AV Peak Grad:      10.9 mmHg AV Mean Grad:      6.0 mmHg LVOT Vmax:         72.00 cm/s LVOT Vmean:        50.500 cm/s LVOT VTI:          0.157 m LVOT/AV VTI ratio: 0.46  AORTA Ao Root diam: 3.00 cm MITRAL VALVE MV Area (PHT): 3.46 cm     SHUNTS MV Area VTI:   1.02 cm     Systemic VTI:  0.16 m MV Peak grad:  12.4 mmHg  Systemic Diam: 2.10 cm MV Mean grad:  6.0 mmHg MV Vmax:       1.76 m/s MV Vmean:      116.0 cm/s MV Decel Time: 219 msec MV E velocity: 138.00 cm/s MV A velocity: 115.00 cm/s MV E/A ratio:  1.20 Kate Sable MD Electronically signed by Kate Sable MD Signature Date/Time: 01/04/2022/3:22:41 PM    Final     Microbiology: Results for orders placed or performed during the hospital encounter of 01/03/22  Resp Panel by RT-PCR (Flu A&B, Covid) Nasopharyngeal Swab     Status: None   Collection Time: 01/03/22 12:31 AM   Specimen: Nasopharyngeal Swab; Nasopharyngeal(NP) swabs in vial transport medium  Result Value Ref Range Status   SARS Coronavirus 2 by RT PCR NEGATIVE NEGATIVE Final    Comment: (NOTE) SARS-CoV-2 target nucleic acids are NOT DETECTED.  The SARS-CoV-2 RNA is generally detectable in upper respiratory specimens during the acute phase of infection. The lowest concentration of SARS-CoV-2 viral copies this assay can detect is 138 copies/mL. A  negative result does not preclude SARS-Cov-2 infection and should not be used as the sole basis for treatment or other patient management decisions. A negative result may occur with  improper specimen collection/handling, submission of specimen other than nasopharyngeal swab, presence of viral mutation(s) within the areas targeted by this assay, and inadequate number of viral copies(<138 copies/mL). A negative result must be combined with clinical observations, patient history, and epidemiological information. The expected result is Negative.  Fact Sheet for Patients:  EntrepreneurPulse.com.au  Fact Sheet for Healthcare Providers:  IncredibleEmployment.be  This test is no t yet approved or cleared by the [place] FDA and  has been authorized for detection and/or diagnosis of SARS-CoV-2 by FDA under an Emergency Use Authorization (EUA). This EUA will remain  in effect (meaning this test can be used) for the duration of the COVID-19 declaration under Section 564(b)(1) of the Act, 21 U.S.C.section 360bbb-3(b)(1), unless the authorization is terminated  or revoked sooner.       Influenza A by PCR NEGATIVE NEGATIVE Final   Influenza B by PCR NEGATIVE NEGATIVE Final    Comment: (NOTE) The Xpert Xpress SARS-CoV-2/FLU/RSV plus assay is intended as an aid in the diagnosis of influenza from Nasopharyngeal swab specimens and should not be used as a sole basis for treatment. Nasal washings and aspirates are unacceptable for Xpert Xpress SARS-CoV-2/FLU/RSV testing.  Fact Sheet for Patients: EntrepreneurPulse.com.au  Fact Sheet for Healthcare Providers: IncredibleEmployment.be  This test is not yet approved or cleared by the [place] FDA and has been authorized for detection and/or diagnosis of SARS-CoV-2 by FDA under an Emergency Use Authorization (EUA). This EUA will remain in effect (meaning this test can  be used) for the duration of the COVID-19 declaration under Section 564(b)(1) of the Act, 21 U.S.C. section 360bbb-3(b)(1), unless the authorization is terminated or revoked.  Performed at [facility], [address] [place], [place] [address]     Labs: CBC: No results for input(s): WBC, NEUTROABS, HGB, HCT, MCV, PLT in the last 168 hours.  Basic Metabolic Panel: No results for input(s): NA, K, CL, CO2, GLUCOSE, BUN, CREATININE, CALCIUM, MG, PHOS in the last 168 hours.  Liver Function Tests: No results for input(s): AST, ALT, ALKPHOS, BILITOT, PROT, ALBUMIN in the last 168 hours.  CBG: No results for input(s): GLUCAP in the last 168 hours.   Discharge time spent: less than 30 minutes.  Signed: Ezekiel Slocumb, DO [facility] 02/01/2022

## 2022-01-17 NOTE — Progress Notes (Signed)
Patient decided on a plan. Patient would like to stay with [facility] and continue using [facility] for [facility]. Patient has confirmed with [name] that they have a nurse to attend treatments on Mondays and Wednesdays, however still in the process of securing a nurse for Fridays. Passed this information to [place] with case management. They will confirm with [facility] the plan and see whether or not they have found a nurse for Fridays.

## 2022-04-11 ENCOUNTER — Ambulatory Visit (INDEPENDENT_AMBULATORY_CARE_PROVIDER_SITE_OTHER): Payer: Medicare Other | Admitting: Podiatry

## 2022-04-11 ENCOUNTER — Encounter: Payer: Self-pay | Admitting: Podiatry

## 2022-04-11 DIAGNOSIS — E119 Type 2 diabetes mellitus without complications: Secondary | ICD-10-CM | POA: Diagnosis not present

## 2022-04-11 DIAGNOSIS — M79674 Pain in right toe(s): Secondary | ICD-10-CM

## 2022-04-11 DIAGNOSIS — B351 Tinea unguium: Secondary | ICD-10-CM

## 2022-04-11 DIAGNOSIS — M79675 Pain in left toe(s): Secondary | ICD-10-CM

## 2022-04-11 DIAGNOSIS — E0843 Diabetes mellitus due to underlying condition with diabetic autonomic (poly)neuropathy: Secondary | ICD-10-CM

## 2022-04-11 NOTE — Progress Notes (Signed)
? ?  SUBJECTIVE ?Patient with a history of diabetes mellitus presents to office today complaining of elongated, thickened nails that cause pain while ambulating in shoes.  Patient is unable to trim their own nails. Patient is here for further evaluation and treatment. ? ? ?Past Medical History:  ?Diagnosis Date  ? Atrial fibrillation ([place])   ? Diabetes mellitus   ? ESRD (end stage renal disease) ([place])   ? Hyperlipidemia   ? Hypertension   ? MI, old   ? Renal disorder   ? dialysls  ? ? ?OBJECTIVE ?General Patient is awake, alert, and oriented x 3 and in no acute distress. ?Derm Skin is dry and supple bilateral. Negative open lesions or macerations. Remaining integument unremarkable. Nails are tender, long, thickened and dystrophic with subungual debris, consistent with onychomycosis, 1-5 bilateral. No signs of infection noted. ?Vasc  DP and PT pedal pulses palpable bilaterally. Temperature gradient within normal limits.  ?Neuro Epicritic and protective threshold sensation diminished bilaterally.  ?Musculoskeletal Exam No symptomatic pedal deformities noted bilateral. Muscular strength within normal limits. ? ?ASSESSMENT ?1. Diabetes Mellitus w/ peripheral neuropathy ?2.  Pain due to onychomycosis of toenails bilateral ? ?PLAN OF CARE ?1. Patient evaluated today.  Comprehensive diabetic foot evaluation performed today ?2. Instructed to maintain good pedal hygiene and foot care. Stressed importance of controlling blood sugar.  ?3. Mechanical debridement of nails 1-5 bilaterally performed using a nail nipper. Filed with dremel without incident.  ?4. Return to clinic in 3 mos.  ? ? ? ?Edrick Kins, DPM ?[place] ? ?Dr. Edrick Kins, DPM  ?  ?[address]                                      ?[place], [place] [address]                ?Office [phone]  ?Fax [phone] ? ? ? ? ? ?

## 2023-04-18 ENCOUNTER — Emergency Department: Payer: 59

## 2023-04-18 ENCOUNTER — Other Ambulatory Visit: Payer: Self-pay

## 2023-04-18 ENCOUNTER — Encounter: Payer: Self-pay | Admitting: Emergency Medicine

## 2023-04-18 ENCOUNTER — Inpatient Hospital Stay
Admission: EM | Admit: 2023-04-18 | Discharge: 2023-04-22 | DRG: 177 | Disposition: A | Discharge status: Home / Self Care | Payer: 59 | Attending: Internal Medicine | Admitting: Internal Medicine

## 2023-04-18 DIAGNOSIS — E876 Hypokalemia: Secondary | ICD-10-CM | POA: Diagnosis present

## 2023-04-18 DIAGNOSIS — I480 Paroxysmal atrial fibrillation: Secondary | ICD-10-CM | POA: Diagnosis present

## 2023-04-18 DIAGNOSIS — I10 Essential (primary) hypertension: Secondary | ICD-10-CM | POA: Diagnosis not present

## 2023-04-18 DIAGNOSIS — I252 Old myocardial infarction: Secondary | ICD-10-CM

## 2023-04-18 DIAGNOSIS — B965 Pseudomonas (aeruginosa) (mallei) (pseudomallei) as the cause of diseases classified elsewhere: Secondary | ICD-10-CM | POA: Diagnosis not present

## 2023-04-18 DIAGNOSIS — N2581 Secondary hyperparathyroidism of renal origin: Secondary | ICD-10-CM | POA: Diagnosis present

## 2023-04-18 DIAGNOSIS — D631 Anemia in chronic kidney disease: Secondary | ICD-10-CM | POA: Diagnosis present

## 2023-04-18 DIAGNOSIS — J151 Pneumonia due to Pseudomonas: Secondary | ICD-10-CM | POA: Diagnosis present

## 2023-04-18 DIAGNOSIS — E1122 Type 2 diabetes mellitus with diabetic chronic kidney disease: Secondary | ICD-10-CM | POA: Diagnosis present

## 2023-04-18 DIAGNOSIS — N186 End stage renal disease: Secondary | ICD-10-CM | POA: Diagnosis present

## 2023-04-18 DIAGNOSIS — Z930 Tracheostomy status: Secondary | ICD-10-CM

## 2023-04-18 DIAGNOSIS — Z7982 Long term (current) use of aspirin: Secondary | ICD-10-CM | POA: Diagnosis not present

## 2023-04-18 DIAGNOSIS — E1142 Type 2 diabetes mellitus with diabetic polyneuropathy: Secondary | ICD-10-CM | POA: Diagnosis present

## 2023-04-18 DIAGNOSIS — Z79899 Other long term (current) drug therapy: Secondary | ICD-10-CM | POA: Diagnosis not present

## 2023-04-18 DIAGNOSIS — J189 Pneumonia, unspecified organism: Secondary | ICD-10-CM | POA: Diagnosis present

## 2023-04-18 DIAGNOSIS — Z992 Dependence on renal dialysis: Secondary | ICD-10-CM | POA: Diagnosis not present

## 2023-04-18 DIAGNOSIS — Z8249 Family history of ischemic heart disease and other diseases of the circulatory system: Secondary | ICD-10-CM | POA: Diagnosis not present

## 2023-04-18 DIAGNOSIS — Z1152 Encounter for screening for COVID-19: Secondary | ICD-10-CM

## 2023-04-18 DIAGNOSIS — I5033 Acute on chronic diastolic (congestive) heart failure: Secondary | ICD-10-CM | POA: Diagnosis not present

## 2023-04-18 DIAGNOSIS — Z952 Presence of prosthetic heart valve: Secondary | ICD-10-CM | POA: Diagnosis not present

## 2023-04-18 DIAGNOSIS — Z794 Long term (current) use of insulin: Secondary | ICD-10-CM | POA: Diagnosis not present

## 2023-04-18 DIAGNOSIS — F32A Depression, unspecified: Secondary | ICD-10-CM | POA: Diagnosis present

## 2023-04-18 DIAGNOSIS — J9601 Acute respiratory failure with hypoxia: Secondary | ICD-10-CM | POA: Diagnosis present

## 2023-04-18 DIAGNOSIS — I120 Hypertensive chronic kidney disease with stage 5 chronic kidney disease or end stage renal disease: Secondary | ICD-10-CM | POA: Diagnosis present

## 2023-04-18 DIAGNOSIS — E785 Hyperlipidemia, unspecified: Secondary | ICD-10-CM | POA: Diagnosis present

## 2023-04-18 DIAGNOSIS — Z833 Family history of diabetes mellitus: Secondary | ICD-10-CM | POA: Diagnosis not present

## 2023-04-18 DIAGNOSIS — Z7901 Long term (current) use of anticoagulants: Secondary | ICD-10-CM | POA: Diagnosis not present

## 2023-04-18 DIAGNOSIS — J9621 Acute and chronic respiratory failure with hypoxia: Secondary | ICD-10-CM | POA: Diagnosis present

## 2023-04-18 DIAGNOSIS — E8770 Fluid overload, unspecified: Secondary | ICD-10-CM | POA: Diagnosis present

## 2023-04-18 DIAGNOSIS — J988 Other specified respiratory disorders: Secondary | ICD-10-CM | POA: Diagnosis not present

## 2023-04-18 LAB — BASIC METABOLIC PANEL
Anion gap: 16 — ABNORMAL HIGH (ref 5–15)
BUN: 34 mg/dL — ABNORMAL HIGH (ref 8–23)
CO2: 28 mmol/L (ref 22–32)
Calcium: 8.6 mg/dL — ABNORMAL LOW (ref 8.9–10.3)
Chloride: 95 mmol/L — ABNORMAL LOW (ref 98–111)
Creatinine, Ser: 5.25 mg/dL — ABNORMAL HIGH (ref 0.44–1.00)
GFR, Estimated: 9 mL/min — ABNORMAL LOW (ref >60)
Glucose, Bld: 101 mg/dL — ABNORMAL HIGH (ref 70–99)
Potassium: 3 mmol/L — ABNORMAL LOW (ref 3.5–5.1)
Sodium: 139 mmol/L (ref 135–145)

## 2023-04-18 LAB — CBC
HCT: 27.9 % — ABNORMAL LOW (ref 36.0–46.0)
Hemoglobin: 9.6 g/dL — ABNORMAL LOW (ref 12.0–15.0)
MCH: 30.3 pg (ref 26.0–34.0)
MCHC: 34.4 g/dL (ref 30.0–36.0)
MCV: 88 fL (ref 80.0–100.0)
Platelets: 187 10*3/uL (ref 150–400)
RBC: 3.17 MIL/uL — ABNORMAL LOW (ref 3.87–5.11)
RDW: 15.3 % (ref 11.5–15.5)
WBC: 10.9 10*3/uL — ABNORMAL HIGH (ref 4.0–10.5)
nRBC: 0 % (ref 0.0–0.2)

## 2023-04-18 LAB — PROCALCITONIN: Procalcitonin: 0.35 ng/mL

## 2023-04-18 LAB — BRAIN NATRIURETIC PEPTIDE: B Natriuretic Peptide: 1400.4 pg/mL — ABNORMAL HIGH (ref 0.0–100.0)

## 2023-04-18 LAB — TROPONIN I (HIGH SENSITIVITY): Troponin I (High Sensitivity): 21 ng/L — ABNORMAL HIGH (ref <18)

## 2023-04-18 LAB — MAGNESIUM: Magnesium: 1.8 mg/dL (ref 1.7–2.4)

## 2023-04-18 LAB — SARS CORONAVIRUS 2 BY RT PCR: SARS Coronavirus 2 by RT PCR: NEGATIVE

## 2023-04-18 LAB — PROTIME-INR
INR: 1.7 — ABNORMAL HIGH (ref 0.8–1.2)
Prothrombin Time: 20.6 seconds — ABNORMAL HIGH (ref 11.4–15.2)

## 2023-04-18 MED ORDER — ONDANSETRON HCL 4 MG PO TABS: 4.0000 mg | ORAL_TABLET | Freq: Four times a day (QID) | ORAL | Status: DC | PRN

## 2023-04-18 MED ORDER — POTASSIUM CHLORIDE 20 MEQ PO PACK
20.0000 meq | PACK | Freq: Once | ORAL | Status: AC
  Administered 2023-04-18: 20 meq via ORAL

## 2023-04-18 MED ORDER — AMLODIPINE BESYLATE 10 MG PO TABS
10.0000 mg | ORAL_TABLET | Freq: Every day | ORAL | Status: DC
  Administered 2023-04-19 – 2023-04-22 (×3): 10 mg via ORAL
  Filled 2023-04-18: qty 2
  Filled 2023-04-18 (×2): qty 1

## 2023-04-18 MED ORDER — IPRATROPIUM-ALBUTEROL 0.5-2.5 (3) MG/3ML IN SOLN
3.0000 mL | Freq: Four times a day (QID) | RESPIRATORY_TRACT | Status: DC
  Administered 2023-04-18 – 2023-04-19 (×2): 3 mL via RESPIRATORY_TRACT
  Filled 2023-04-18 (×2): qty 3

## 2023-04-18 MED ORDER — INSULIN DETEMIR 100 UNIT/ML ~~LOC~~ SOLN
13.0000 [IU] | Freq: Every day | SUBCUTANEOUS | Status: DC
  Filled 2023-04-18: qty 0.13

## 2023-04-18 MED ORDER — ASPIRIN 81 MG PO TBEC
81.0000 mg | DELAYED_RELEASE_TABLET | Freq: Every day | ORAL | Status: DC
  Administered 2023-04-19 – 2023-04-22 (×4): 81 mg via ORAL
  Filled 2023-04-18 (×4): qty 1

## 2023-04-18 MED ORDER — SODIUM CHLORIDE 0.9 % IV SOLN
2.0000 g | INTRAVENOUS | Status: DC
  Administered 2023-04-19: 2 g via INTRAVENOUS
  Filled 2023-04-18 (×2): qty 20

## 2023-04-18 MED ORDER — APIXABAN 2.5 MG PO TABS
2.5000 mg | ORAL_TABLET | Freq: Two times a day (BID) | ORAL | Status: DC
  Administered 2023-04-19 – 2023-04-22 (×7): 2.5 mg via ORAL
  Filled 2023-04-18 (×7): qty 1

## 2023-04-18 MED ORDER — ACETAMINOPHEN 650 MG RE SUPP: 650.0000 mg | Freq: Four times a day (QID) | RECTAL | Status: DC | PRN

## 2023-04-18 MED ORDER — GUAIFENESIN ER 600 MG PO TB12
600.0000 mg | ORAL_TABLET | Freq: Two times a day (BID) | ORAL | Status: DC
  Administered 2023-04-18 – 2023-04-22 (×8): 600 mg via ORAL
  Filled 2023-04-18 (×8): qty 1

## 2023-04-18 MED ORDER — FUROSEMIDE 40 MG PO TABS: 40.0000 mg | ORAL_TABLET | Freq: Every day | ORAL | Status: DC

## 2023-04-18 MED ORDER — MAGNESIUM HYDROXIDE 400 MG/5ML PO SUSP: 30.0000 mL | Freq: Every day | ORAL | Status: DC | PRN

## 2023-04-18 MED ORDER — SODIUM CHLORIDE 0.9 % IV SOLN
500.0000 mg | INTRAVENOUS | Status: DC
  Administered 2023-04-19 – 2023-04-21 (×4): 500 mg via INTRAVENOUS
  Filled 2023-04-18 (×5): qty 5

## 2023-04-18 MED ORDER — GABAPENTIN 300 MG PO CAPS
300.0000 mg | ORAL_CAPSULE | Freq: Every day | ORAL | Status: DC
  Administered 2023-04-18 – 2023-04-21 (×4): 300 mg via ORAL
  Filled 2023-04-18 (×4): qty 1

## 2023-04-18 MED ORDER — POTASSIUM CHLORIDE 20 MEQ PO PACK
40.0000 meq | PACK | Freq: Once | ORAL | Status: AC
  Administered 2023-04-18: 40 meq via ORAL
  Filled 2023-04-18: qty 2

## 2023-04-18 MED ORDER — TRAZODONE HCL 50 MG PO TABS: 50.0000 mg | ORAL_TABLET | ORAL | Status: DC

## 2023-04-18 MED ORDER — CARVEDILOL 25 MG PO TABS
25.0000 mg | ORAL_TABLET | Freq: Two times a day (BID) | ORAL | Status: DC
  Administered 2023-04-18 – 2023-04-22 (×8): 25 mg via ORAL
  Filled 2023-04-18 (×8): qty 1

## 2023-04-18 MED ORDER — IRBESARTAN 75 MG PO TABS
37.5000 mg | ORAL_TABLET | Freq: Every day | ORAL | Status: DC
  Administered 2023-04-18 – 2023-04-22 (×5): 37.5 mg via ORAL
  Filled 2023-04-18 (×5): qty 0.5

## 2023-04-18 MED ORDER — PANTOPRAZOLE SODIUM 40 MG PO TBEC
40.0000 mg | DELAYED_RELEASE_TABLET | Freq: Every day | ORAL | Status: DC
  Administered 2023-04-19 – 2023-04-22 (×4): 40 mg via ORAL
  Filled 2023-04-18 (×4): qty 1

## 2023-04-18 MED ORDER — ACETAMINOPHEN 325 MG PO TABS: 650.0000 mg | ORAL_TABLET | Freq: Four times a day (QID) | ORAL | Status: DC | PRN

## 2023-04-18 MED ORDER — HEPARIN SODIUM (PORCINE) 5000 UNIT/ML IJ SOLN: 5000.0000 [IU] | Freq: Three times a day (TID) | INTRAMUSCULAR | Status: DC

## 2023-04-18 MED ORDER — FUROSEMIDE 10 MG/ML IJ SOLN
40.0000 mg | Freq: Two times a day (BID) | INTRAMUSCULAR | Status: DC
  Administered 2023-04-18 – 2023-04-22 (×8): 40 mg via INTRAVENOUS
  Filled 2023-04-18 (×8): qty 4

## 2023-04-18 MED ORDER — SODIUM CHLORIDE 0.9 % IV SOLN
2.0000 g | Freq: Once | INTRAVENOUS | Status: AC
  Administered 2023-04-18: 2 g via INTRAVENOUS
  Filled 2023-04-18: qty 20

## 2023-04-18 MED ORDER — SODIUM CHLORIDE 0.9 % IV SOLN
500.0000 mg | Freq: Once | INTRAVENOUS | Status: DC
  Filled 2023-04-18: qty 5

## 2023-04-18 MED ORDER — ONDANSETRON HCL 4 MG/2ML IJ SOLN
4.0000 mg | Freq: Four times a day (QID) | INTRAMUSCULAR | Status: DC | PRN
  Administered 2023-04-19 – 2023-04-20 (×2): 4 mg via INTRAVENOUS
  Filled 2023-04-18 (×2): qty 2

## 2023-04-18 MED ORDER — MIDODRINE HCL 5 MG PO TABS
5.0000 mg | ORAL_TABLET | ORAL | Status: DC
  Administered 2023-04-20: 5 mg via ORAL
  Filled 2023-04-18 (×2): qty 1

## 2023-04-18 MED ORDER — HYDROCOD POLI-CHLORPHE POLI ER 10-8 MG/5ML PO SUER: 5.0000 mL | Freq: Two times a day (BID) | ORAL | Status: DC | PRN

## 2023-04-18 MED ORDER — SEVELAMER CARBONATE 800 MG PO TABS
1600.0000 mg | ORAL_TABLET | Freq: Three times a day (TID) | ORAL | Status: DC
  Administered 2023-04-19 – 2023-04-22 (×10): 1600 mg via ORAL
  Filled 2023-04-18 (×13): qty 2

## 2023-04-18 MED ORDER — ESCITALOPRAM OXALATE 10 MG PO TABS
10.0000 mg | ORAL_TABLET | Freq: Every day | ORAL | Status: DC
  Administered 2023-04-19 – 2023-04-22 (×4): 10 mg via ORAL
  Filled 2023-04-18 (×4): qty 1

## 2023-04-18 MED ORDER — TRAZODONE HCL 50 MG PO TABS
25.0000 mg | ORAL_TABLET | Freq: Every evening | ORAL | Status: DC | PRN
  Administered 2023-04-22: 25 mg via ORAL
  Filled 2023-04-18: qty 1

## 2023-04-18 MED ORDER — ATORVASTATIN CALCIUM 20 MG PO TABS
80.0000 mg | ORAL_TABLET | Freq: Every day | ORAL | Status: DC
  Administered 2023-04-19 – 2023-04-22 (×4): 80 mg via ORAL
  Filled 2023-04-18 (×4): qty 4

## 2023-04-18 NOTE — ED Provider Notes (Signed)
[facility] Provider Note    Event Date/Time   First MD Initiated Contact with Patient 04/18/23 1827     (approximate)   History   Shortness of Breath   HPI  Deborah [name] is a 67 y.o. 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  The patient states that over the last 2 to 3 days she has had progressively worsening generalized weakness.  She had diarrhea a day and a half ago and actually was unable to control this which is abnormal for her.  She then vomited this morning.  She has had a cough and increased shortness of breath.  She actually had her dialysis session canceled on Monday because this is the center called her and told her that she could not come.  She went today and is scheduled to get it again tomorrow.  She sat for a full session.  She had some mild associated shortness of breath, increased cough.  Denies any increased production through her trach.  She does state that she has had general fatigue.  She has had decreased appetite today.     Physical Exam   Triage Vital Signs: ED Triage Vitals  Enc Vitals Group     BP 04/18/23 1526 (!) 152/67     Pulse Rate 04/18/23 1526 75     Resp 04/18/23 1526 18     Temp 04/18/23 1526 98.7 F (37.1 C)     Temp Source 04/18/23 1526 Oral     SpO2 04/18/23 1526 100 %     Weight --      Height --      Head Circumference --      Peak Flow --      Pain Score 04/18/23 1524 0     Pain Loc --      Pain Edu? --      Excl. in GC? --     Most recent vital signs: Vitals:   04/18/23 1938 04/18/23 2100  BP:  (!) 142/68  Pulse:  69  Resp:    Temp: 98.3 F (36.8 C)   SpO2:  100%     General: Awake, no distress.  CV:  Good peripheral perfusion.  Regular rate and rhythm.  No murmurs. Resp:  Normal work of breathing.  Rhonchi noted in bilateral bases with slight rales, trach is patent. Abd:  No distention.  No  tenderness. Other:  No significant edema.   ED Results / Procedures / Treatments   Labs (all labs ordered are listed, but only abnormal results are displayed) Labs Reviewed  BASIC METABOLIC PANEL - Abnormal; Notable for the following components:      Result Value   Potassium 3.0 (*)    Chloride 95 (*)    Glucose, Bld 101 (*)    BUN 34 (*)    Creatinine, Ser 5.25 (*)    Calcium 8.6 (*)    GFR, Estimated 9 (*)    Anion gap 16 (*)    All other components within normal limits  CBC - Abnormal; Notable for the following components:   WBC 10.9 (*)    RBC 3.17 (*)    Hemoglobin 9.6 (*)    HCT 27.9 (*)    All other components within normal limits  PROTIME-INR - Abnormal; Notable for the following components:   Prothrombin Time 20.6 (*)    INR 1.7 (*)    All  other components within normal limits  BRAIN NATRIURETIC PEPTIDE - Abnormal; Notable for the following components:   B Natriuretic Peptide 1,400.4 (*)    All other components within normal limits  TROPONIN I (HIGH SENSITIVITY) - Abnormal; Notable for the following components:   Troponin I (High Sensitivity) 21 (*)    All other components within normal limits  SARS CORONAVIRUS 2 BY RT PCR  CULTURE, BLOOD (ROUTINE X 2)  CULTURE, BLOOD (ROUTINE X 2)  CULTURE, RESPIRATORY W GRAM STAIN  PROCALCITONIN  HIV ANTIBODY (ROUTINE TESTING W REFLEX)  BASIC METABOLIC PANEL  CBC  MAGNESIUM  TROPONIN I (HIGH SENSITIVITY)     EKG Normal sinus rhythm, ventricular rate 76.  PR 176, QRS 90, QTc 499.  No acute ST elevations or depression.  Acute evidence of acute ischemia or infarct.   RADIOLOGY Chest x-ray: Cardiomegaly with central pulmonary vascular congestion, increasing patchy opacities in both lung bases   I also independently reviewed and agree with radiologist interpretations.   PROCEDURES:  Critical Care performed: No  .1-3 Lead EKG Interpretation  Performed by: Shaune Pollack, MD Authorized by: Shaune Pollack, MD      Interpretation: normal     ECG rate:  70-90   ECG rate assessment: normal     Rhythm: sinus rhythm     Ectopy: none     Conduction: normal   Comments:     Indication: SOB     MEDICATIONS ORDERED IN ED: Medications  aspirin EC tablet 81 mg (has no administration in time range)  amLODipine (NORVASC) tablet 10 mg (has no administration in time range)  atorvastatin (LIPITOR) tablet 80 mg (has no administration in time range)  carvedilol (COREG) tablet 25 mg (has no administration in time range)  midodrine (PROAMATINE) tablet 5 mg (has no administration in time range)  irbesartan (AVAPRO) tablet 37.5 mg (has no administration in time range)  escitalopram (LEXAPRO) tablet 10 mg (has no administration in time range)  traZODone (DESYREL) tablet 50 mg (has no administration in time range)  insulin detemir (LEVEMIR) FlexPen 13 Units (has no administration in time range)  pantoprazole (PROTONIX) EC tablet 40 mg (has no administration in time range)  sevelamer carbonate (RENVELA) tablet 1,600 mg (has no administration in time range)  apixaban (ELIQUIS) tablet 2.5 mg (has no administration in time range)  gabapentin (NEURONTIN) capsule 300 mg (has no administration in time range)  guaiFENesin (MUCINEX) 12 hr tablet 600 mg (has no administration in time range)  chlorpheniramine-HYDROcodone (TUSSIONEX) 10-8 MG/5ML suspension 5 mL (has no administration in time range)  ipratropium-albuterol (DUONEB) 0.5-2.5 (3) MG/3ML nebulizer solution 3 mL ( Nebulization Canceled Entry 04/18/23 2241)  heparin injection 5,000 Units (has no administration in time range)  acetaminophen (TYLENOL) tablet 650 mg (has no administration in time range)    Or  acetaminophen (TYLENOL) suppository 650 mg (has no administration in time range)  traZODone (DESYREL) tablet 25 mg (has no administration in time range)  magnesium hydroxide (MILK OF MAGNESIA) suspension 30 mL (has no administration in time range)  ondansetron  (ZOFRAN) tablet 4 mg (has no administration in time range)    Or  ondansetron (ZOFRAN) injection 4 mg (has no administration in time range)  cefTRIAXone (ROCEPHIN) 2 g in sodium chloride 0.9 % 100 mL IVPB (has no administration in time range)  azithromycin (ZITHROMAX) 500 mg in sodium chloride 0.9 % 250 mL IVPB (has no administration in time range)  furosemide (LASIX) injection 40 mg (has no administration in time range)  potassium chloride (KLOR-CON) packet 20 mEq (has no administration in time range)  potassium chloride (KLOR-CON) packet 40 mEq (has no administration in time range)  cefTRIAXone (ROCEPHIN) 2 g in sodium chloride 0.9 % 100 mL IVPB (0 g Intravenous Stopped 04/18/23 2206)     IMPRESSION / MDM / ASSESSMENT AND PLAN / ED COURSE  I reviewed the triage vital signs and the nursing notes.                              Differential diagnosis includes, but is not limited to, PNA, tracheitis, pulmonary edema/fluid overload, URI, ACS, PE, anemia, electrolyte abnormality  Patient's presentation is most consistent with acute presentation with potential threat to life or bodily function.  The patient is on the cardiac monitor to evaluate for evidence of arrhythmia and/or significant heart rate changes  67 year old female with history of subglottic stenosis status post tracheostomy placement here with cough, fatigue, vomiting, and diarrhea.  Patient has been hypoxic at home to the mid 80s.  She normally only uses oxygen at night.  She has also missed dialysis on Monday because the center could not facilitate her.  She has had some chills.  Chest x-ray concerning for possible pneumonia and she has a slightly elevated procalcitonin although it is not far from her baseline.  Mild leukocytosis of 10.9 is noted.  Electrolytes are acceptable.  BNP is elevated.  Will cover for possible pneumonia given her hypoxia, leukocytosis, and procalcitonin elevation, suspect she may also need repeat dialysis  tomorrow.  No evidence of Pseudomonas or resistant organisms on my initial review of microbiology.  CT scan obtained for further assessment given abnormal chest x-ray.  This shows pulmonary arterial hypertension, small bilateral effusions but also patchy infiltrates in the mid and lower lung fields, more so on the right lower lobe, concerning for pneumonia.  This fits with her story clinically.  Will admit.   FINAL CLINICAL IMPRESSION(S) / ED DIAGNOSES   Final diagnoses:  Community acquired pneumonia, unspecified laterality     Rx / DC Orders   ED Discharge Orders     None        Note:  This document was prepared using Dragon voice recognition software and may include unintentional dictation errors.   Shaune Pollack, MD 04/18/23 2245

## 2023-04-18 NOTE — Assessment & Plan Note (Signed)
-   We will continue Lexapro and trazodone. ?

## 2023-04-18 NOTE — Assessment & Plan Note (Signed)
X-ray concerning for pneumonia, procalcitonin at 0.35, patient has a chronic trach collar, trach collar respirate cultures pending, preliminary blood cultures negative in 12 hours. - Will continue antibiotic therapy with IV Rocephin and Zithromax. - Supportive care -Follow-up cultures

## 2023-04-18 NOTE — Assessment & Plan Note (Signed)
Patient did miss 1 dialysis on Monday.  Chest x-ray with concern of some pulmonary edema, went for an extra session today. -Continue to monitor

## 2023-04-18 NOTE — Assessment & Plan Note (Signed)
-   We will continue her antihypertensives. 

## 2023-04-18 NOTE — Assessment & Plan Note (Signed)
Patient initially required up to 5 L of oxygen with 3 L of baseline use, now back to baseline of 3 L through trach collar. -Continue to

## 2023-04-18 NOTE — Plan of Care (Signed)
Patient A&Ox4, from home, independent in room  

## 2023-04-18 NOTE — Assessment & Plan Note (Signed)
-   We will continue statin therapy. 

## 2023-04-18 NOTE — ED Triage Notes (Signed)
Patient to ED via [facility] from home for SOB since Monday. Wears 3L at baseline through trach. Patient missed dialysis on Monday but received full treatment today and has treatment scheduled tomorrow. NAD noted.  EMS VS: 100% on 3L 77 HR 155/78

## 2023-04-18 NOTE — Assessment & Plan Note (Addendum)
Replaced. °

## 2023-04-18 NOTE — H&P (Addendum)
[place]   PATIENT NAME: Deborah [name]    MR#:  [id number]  DATE OF BIRTH:  1956/08/05  DATE OF ADMISSION:  04/18/2023  PRIMARY CARE PHYSICIAN: Si Gaul, MD   Patient is coming from: Home  REQUESTING/REFERRING PHYSICIAN: Shaune Pollack, MD  CHIEF COMPLAINT:   Chief Complaint  Patient presents with   Shortness of Breath    HISTORY OF PRESENT ILLNESS:  Deborah [name] is a 67 y.o. African-American female with medical history significant for hypertension dyslipidemia, diabetes mellitus, and end-stage renal disease on hemodialysis on Monday Wednesday and Fridays as well as atrial fibrillation, presented to the emergency room with acute onset of feeling sick and generalized weakness with excessive sleepiness nausea or vomiting and diarrhea since yesterday.  She admits to dyspnea with associated congested cough.  She denies any wheezing.  She denies any dysuria, urinary frequency or urgency or flank pain.  She was hemodialysis on Monday for scheduling problem and today had a full session.  She is usually on home O2 at 3 L/min at night only and during hemodialysis.  She administers oxygen through tracheostomy collar.  She denies any fever however has been having mild chills.  No chest pain or palpitations. She denies any bilious vomitus or hematemesis.  No abdominal pain or melena or bright red bleeding per rectum.  ED Course: When she came to the ER BP was 152/67 with pulse symmetry of 100% on 3 L of O2 by simple mask and otherwise normal vital signs.  Labs revealed hypokalemia and a BUN of 34 and creatinine calcium 8.6 with anion gap of 16.  BNP was 1460 troponin I 21.  CBC showed A1c of 10.9 with hemoglobin 9.6 hematocrit 27.9 above previous levels.  INR was 1.7 PT 20.6 with glucose of 101. EKG as reviewed by me :  EKG showed normal sinus rhythm with a rate of 76, QT interval with QTc of 499 MS and Q waves laterally with T wave inversion. Imaging: Two-view chest x-ray showed the  following: 1. Cardiomegaly with central pulmonary vascular congestion. 2. Increasing patchy opacities in both lung bases with new trace bilateral pleural effusions.  Chest CT without contrast revealed the following: Cardiomegaly. Coronary artery disease. There is ectasia of the main pulmonary artery suggesting possible pulmonary arterial hypertension. Small right pleural effusion. Minimal left pleural effusion. There are patchy infiltrates in mid and lower lung fields, more so in right lower lobe suggesting atelectasis/pneumonia.  The patient was given IV Rocephin and Zithromax.  She will be admitted to a medical telemetry bed for further evaluation and management.   PAST MEDICAL HISTORY:   Past Medical History:  Diagnosis Date   Atrial fibrillation ([name])    Diabetes mellitus    ESRD (end stage renal disease) ([name])    Hyperlipidemia    Hypertension    MI, old    Renal disorder    dialysls    PAST SURGICAL HISTORY:   Past Surgical History:  Procedure Laterality Date   A/V FISTULAGRAM N/A 10/05/2017   Procedure: A/V Fistulagram;  Surgeon: Renford Dills, MD;  Location: [facility] INVASIVE CV LAB;  Service: Cardiovascular;  Laterality: N/A;   A/V SHUNTOGRAM N/A 10/05/2017   Procedure: A/V SHUNTOGRAM;  Surgeon: Renford Dills, MD;  Location: [facility] INVASIVE CV [facility];  Service: Cardiovascular;  Laterality: N/A;   AV FISTULA PLACEMENT Left 10/02/2016   Procedure: INSERTION OF ARTERIOVENOUS (AV) GORE-TEX GRAFT ARM;  Surgeon: Maeola Harman, MD;  Location:  [facility] OR;  Service: Vascular;  Laterality: Left;   CARDIAC CATHETERIZATION N/A 08/15/2016   Procedure: Left Heart Cath and Coronary Angiography;  Surgeon: Alwyn Pea, MD;  Location: [facility] INV[facility] CV [facility];  Service: Cardiovascular;  Laterality: N/A;   CARDIAC CATHETERIZATION N/A 08/15/2016   Procedure: Coronary Stent Intervention;  Surgeon: Alwyn Pea, MD;  Location: [facility] INVASIVE CV LAB;  Service: Cardiovascular;   Laterality: N/A;   CARDIAC CATHETERIZATION N/A 08/18/2016   Procedure: Right Heart Cath;  Surgeon: Dolores Patty, MD;  Location: [facility] INVASIVE CV LAB;  Service: Cardiovascular;  Laterality: N/A;   CARDIAC CATHETERIZATION N/A 08/18/2016   Procedure: IABP Insertion;  Surgeon: Dolores Patty, MD;  Location: [facility] INVAS[facility] CV [facility];  Service: Cardiovascular;  Laterality: N/A;   CARDIAC CATHETERIZATION Right 08/23/2016   Procedure: CENTRAL LINE INSERTION RIGHT SUBCLAVIAN;  Surgeon: Kerin Perna, MD;  Location: [facility] OR;  Service: Open Heart Surgery;  Laterality: Right;   ENDOVEIN HARVEST OF GREATER SAPHENOUS VEIN Left 08/23/2016   Procedure: ENDOVEIN HARVEST OF GREATER SAPHENOUS VEIN;  Surgeon: Kerin Perna, MD;  Location: [facility] OR;  Service: Open Heart Surgery;  Laterality: Left;   INSERTION OF DIALYSIS CATHETER Bilateral 08/31/2016   Procedure: INSERTION OF DIALYSIS CATHETER LEFT INTERNAL JUGULAR VEIN & INSERTION OF TRIPLE LUMEN RIGHT INTERNAL JUGULAR VEIN;  Surgeon: Chuck Hint, MD;  Location: [facility] OR;  Service: Vascular;  Laterality: Bilateral;   INTRAOPERATIVE TRANSESOPHAGEAL ECHOCARDIOGRAM N/A 08/23/2016   Procedure: INTRAOPERATIVE TRANSESOPHAGEAL ECHOCARDIOGRAM;  Surgeon: Kerin Perna, MD;  Location: [facility] OR;  Service: Open Heart Surgery;  Laterality: N/A;   IR GENERIC HISTORICAL  11/13/2016   IR US GUIDE VASC ACCESS LEFT 11/13/2016 Gilmer Mor, DO [facility]   IR GENERIC HISTORICAL  11/13/2016   IR FLUORO GUIDE CV LINE LEFT 11/13/2016 Gilmer Mor, DO [facility]   IR GENERIC HISTORICAL  11/13/2016   IR GASTROSTOMY TUBE MOD SED 11/13/2016 Gilmer Mor, DO [facility]   MITRAL VALVE REPAIR N/A 08/23/2016   Procedure: MITRAL VALVE REPAIR (MVR) USING EDWARDS MAGNA EASE BIOPROSTHESIS MITRAL  VALVE;  Surgeon: Kerin Perna, MD;  Location: [facility] OR;  Service: Open Heart Surgery;  Laterality: N/A;   tracheostomy reversal     TRACHEOSTOMY TUBE PLACEMENT N/A 11/09/2016   Procedure:  TRACHEOSTOMY;  Surgeon: Suzanna Obey, MD;  Location: [facility] OR;  Service: ENT;  Laterality: N/A;   VAGINAL DELIVERY     x 6    SOCIAL HISTORY:   Social History   Tobacco Use   Smoking status: Never   Smokeless tobacco: Never  Substance Use Topics   Alcohol use: No    FAMILY HISTORY:   Family History  Problem Relation Age of Onset   Hypertension Mother    Diabetes Mother    Breast cancer Sister    Hypertension Father     DRUG ALLERGIES:  No Known Allergies  REVIEW OF SYSTEMS:   ROS As per history of present illness. All pertinent systems were reviewed above. Constitutional, HEENT, cardiovascular, respiratory, GI, GU, musculoskeletal, neuro, psychiatric, endocrine, integumentary and hematologic systems were reviewed and are otherwise negative/unremarkable except for positive findings mentioned above in the HPI.   MEDICATIONS AT HOME:   Prior to Admission medications   Medication Sig Start Date End Date Taking? Authorizing Provider  acetaminophen (TYLENOL) 325 MG tablet Take 650 mg by mouth every 6 (six) hours as needed for mild pain or fever.  04/06/17   [provider]  albuterol (PROVENTIL) (  2.5 MG/3ML) 0.083% nebulizer solution Take 3 mLs (2.5 mg total) by nebulization 4 (four) times daily - after meals and at bedtime. Patient taking differently: Take 2.5 mg by nebulization 3 (three) times daily as needed. 11/01/16   Love, Evlyn Kanner, PA-C  amLODipine (NORVASC) 10 MG tablet Take 10 mg by mouth daily. 08/12/20   [provider]  apixaban (ELIQUIS) 2.5 MG TABS tablet Take 2.5 mg by mouth 2 (two) times daily.  06/22/17   [provider]  aspirin 81 MG EC tablet Take 1 tablet (81 mg total) by mouth daily. 10/24/19   Swayze, Ava, DO  atorvastatin (LIPITOR) 80 MG tablet Take 80 mg by mouth daily at 6 PM.     [provider]  carvedilol (COREG) 25 MG tablet Take 25 mg by mouth 2 (two) times daily. 04/22/18   [provider]  escitalopram  (LEXAPRO) 10 MG tablet Take 10 mg by mouth daily. 09/13/20   [provider]  furosemide (LASIX) 40 MG tablet Take 40 mg by mouth daily. ON NON dialysis days, Sunday, Tuesday, Thursday, Saturday    [provider]  gabapentin (NEURONTIN) 300 MG capsule Take 1 capsule (300 mg total) by mouth at bedtime. 04/04/20   Standley Brooking, MD  LEVEMIR FLEXTOUCH 100 UNIT/ML FlexPen Inject 13 Units into the skin at bedtime. 03/05/20   [provider]  lidocaine-prilocaine (EMLA) cream Apply 1 application topically every dialysis. 09/26/19   [provider]  midodrine (PROAMATINE) 5 MG tablet Take 5 mg by mouth 3 (three) times a week.    [provider]  olmesartan (BENICAR) 20 MG tablet Take 20 mg by mouth daily. 03/12/20   [provider]  pantoprazole (PROTONIX) 40 MG tablet Take 1 tablet (40 mg total) by mouth 2 (two) times daily. 11/01/16   Love, Evlyn Kanner, PA-C  sevelamer carbonate (RENVELA) 800 MG tablet Take 1,600 mg by mouth 3 (three) times daily with meals.    [provider]  traZODone (DESYREL) 50 MG tablet Take 50 mg by mouth 3 (three) times a week. Take on Dialysis days 04/04/18   [provider]      VITAL SIGNS:  Blood pressure (!) 142/68, pulse 69, temperature 98.3 F (36.8 C), temperature source Oral, resp. rate 18, SpO2 100 %.  PHYSICAL EXAMINATION:  Physical Exam  GENERAL:  67 y.o.-year-old African-American female patient lying in the bed with no acute distress.  EYES: Pupils equal, round, reactive to light and accommodation. No scleral icterus. Extraocular muscles intact.  HEENT: Head atraumatic, normocephalic. Oropharynx and nasopharynx clear.  NECK:  Supple, no jugular venous distention. No thyroid enlargement, no tenderness.  LUNGS: Normal breath sounds bilaterally, no wheezing, rales,rhonchi or crepitation. No use of accessory muscles of respiration.  CARDIOVASCULAR: Regular rate and rhythm, S1, S2 normal. No  murmurs, rubs, or gallops.  ABDOMEN: Soft, nondistended, nontender. Bowel sounds present. No organomegaly or mass.  EXTREMITIES: No pedal edema, cyanosis, or clubbing.  NEUROLOGIC: Cranial nerves II through XII are intact. Muscle strength 5/5 in all extremities. Sensation intact. Gait not checked.  PSYCHIATRIC: The patient is alert and oriented x 3.  Normal affect and good eye contact. SKIN: No obvious rash, lesion, or ulcer.   LABORATORY PANEL:   CBC Recent Labs  Lab 04/18/23 1527  WBC 10.9*  HGB 9.6*  HCT 27.9*  PLT 187   ------------------------------------------------------------------------------------------------------------------  Chemistries  Recent Labs  Lab 04/18/23 1527  NA 139  K 3.0*  CL 95*  CO2 28  GLUCOSE 101*  BUN 34*  CREATININE 5.25*  CALCIUM 8.6*   ------------------------------------------------------------------------------------------------------------------  Cardiac Enzymes No results for input(s): "TROPONINI" in the last 168 hours. ------------------------------------------------------------------------------------------------------------------  RADIOLOGY:  CT Chest Wo Contrast  Result Date: 04/18/2023 CLINICAL DATA:  Shortness of breath EXAM: CT CHEST WITHOUT CONTRAST TECHNIQUE: Multidetector CT imaging of the chest was performed following the standard protocol without IV contrast. RADIATION DOSE REDUCTION: This exam was performed according to the departmental dose-optimization program which includes automated exposure control, adjustment of the mA and/or kV according to patient size and/or use of iterative reconstruction technique. COMPARISON:  Previous studies including the CT done on November 18, 2020 and chest radiograph done earlier today FINDINGS: Cardiovascular: There is ectasia of the main pulmonary artery measuring 3.6 cm suggesting pulmonary arterial hypertension. Coronary artery calcifications are seen. 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 Heart is  enlarged in size. Metallic sutures are seen in the sternum. Mediastinum/Nodes: There are slightly enlarged nodes in mediastinum. This finding has not changed significantly. Lungs/Pleura: Tip of tracheostomy tube is slightly above the level of aortic arch. Small right pleural effusion is seen. Minimal left pleural effusion is seen. There are patchy infiltrates in both perihilar regions and both lower lung fields, more so in the right lower lobe suggesting atelectasis/pneumonia. There is no pneumothorax. Upper Abdomen: Small hiatal hernia is seen. There are small calcifications in liver and spleen, possibly granulomas. Musculoskeletal: Sclerotic density seen in the spinous process of T2 vertebra has not changed, possibly benign bone island. There is subcutaneous edema and skin thickening in both breasts without any loculated fluid collections. Degenerative changes are noted in the visualized lower cervical spine with encroachment of neural foramina. IMPRESSION: Cardiomegaly. Coronary artery disease. There is ectasia of the main pulmonary artery suggesting possible pulmonary arterial hypertension. Small right pleural effusion. Minimal left pleural effusion. There are patchy infiltrates in mid and lower lung fields, more so in right lower lobe suggesting atelectasis/pneumonia. Small hiatal hernia. Other findings as described in the body of the report. Electronically Signed   By: Ernie Avena M.D.   On: 04/18/2023 20:23   DG Chest 2 View  Result Date: 04/18/2023 CLINICAL DATA:  Shortness of breath EXAM: CHEST - 2 VIEW COMPARISON:  Chest x-ray 01/04/2022 FINDINGS: Tracheostomy is unchanged in position. Patient is status post heart valve replacement. The heart is enlarged. There central pulmonary vascular congestion. There are increasing patchy opacities in both lung bases with new trace bilateral pleural effusions. No pneumothorax or acute fracture. Vascular stent/graft seen in the proximal left arm.  IMPRESSION: 1. Cardiomegaly with central pulmonary vascular congestion. 2. Increasing patchy opacities in both lung bases with new trace bilateral pleural effusions. Electronically Signed   By: Darliss Cheney M.D.   On: 04/18/2023 16:04      IMPRESSION AND PLAN:  Assessment and Plan: * CAP (community acquired pneumonia) - This is likely multifocal community-acquired pneumonia. - The patient will be admitted to a medical telemetry bed. - Will continue antibiotic therapy with IV Rocephin and Zithromax. - Mucolytic therapy be provided as well as duo nebs q.i.d. and q.4 hours p.r.n. - We will follow blood cultures.   Fluid overload - This is associated with her end-stage renal disease on hemodialysis. - Will diurese with IV Lasix and obtain nephrology consult. - I notified Dr. Wynelle Link.  Acute respiratory failure with hypoxia ([name]) - She is requiring 3 L of O2 at this time. - This is clearly secondary to #1 and possibly #2. - We will  taper off O2 per protocol as tolerated.  Hypokalemia - We will replace potassium and check magnesium level.  Essential hypertension - We will continue her antihypertensives.  Dyslipidemia - We will continue statin therapy.  Depression - We will continue Lexapro and trazodone.  Type 2 diabetes mellitus with peripheral neuropathy ([name]) - The patient will be placed on supplemental coverage with NovoLog. - We will continue basal coverage. - We will continue Neurontin.       DVT prophylaxis: SQ heparin. Advanced Care Planning:  Code Status: full code. Family Communication:  The plan of care was discussed in details with the patient (and family). I answered all questions. The patient agreed to proceed with the above mentioned plan. Further management will depend upon hospital course. Disposition Plan: Back to previous home environment Consults called: Nephrology. All the records are reviewed and case discussed with ED provider.  Status is:  Inpatient   At the time of the admission, it appears that the appropriate admission status for this patient is inpatient.  This is judged to be reasonable and necessary in order to provide the required intensity of service to ensure the patient's safety given the presenting symptoms, physical exam findings and initial radiographic and laboratory data in the context of comorbid conditions.  The patient requires inpatient status due to high intensity of service, high risk of further deterioration and high frequency of surveillance required.  I certify that at the time of admission, it is my clinical judgment that the patient will require inpatient hospital care extending more than 2 midnights.                            Dispo: The patient is from: Home              Anticipated d/c is to: Home              Patient currently is not medically stable to d/c.              Difficult to place patient: No  Hannah Beat M.D on 04/18/2023 at 10:39 PM  [facility]   From 7 PM-7 AM, contact night-coverage [email or web address]  CC: Primary care physician; Si Gaul, MD

## 2023-04-18 NOTE — Assessment & Plan Note (Signed)
-   The patient will be placed on supplemental coverage with NovoLog. - We will continue basal coverage. - We will continue Neurontin. 

## 2023-04-19 DIAGNOSIS — J189 Pneumonia, unspecified organism: Secondary | ICD-10-CM | POA: Diagnosis not present

## 2023-04-19 LAB — BASIC METABOLIC PANEL
Anion gap: 14 (ref 5–15)
BUN: 46 mg/dL — ABNORMAL HIGH (ref 8–23)
CO2: 29 mmol/L (ref 22–32)
Calcium: 8 mg/dL — ABNORMAL LOW (ref 8.9–10.3)
Chloride: 97 mmol/L — ABNORMAL LOW (ref 98–111)
Creatinine, Ser: 7.59 mg/dL — ABNORMAL HIGH (ref 0.44–1.00)
GFR, Estimated: 5 mL/min — ABNORMAL LOW (ref >60)
Glucose, Bld: 152 mg/dL — ABNORMAL HIGH (ref 70–99)
Potassium: 3.7 mmol/L (ref 3.5–5.1)
Sodium: 140 mmol/L (ref 135–145)

## 2023-04-19 LAB — GLUCOSE, CAPILLARY
Glucose-Capillary: 179 mg/dL — ABNORMAL HIGH (ref 70–99)
Glucose-Capillary: 197 mg/dL — ABNORMAL HIGH (ref 70–99)

## 2023-04-19 LAB — CBC
HCT: 25.3 % — ABNORMAL LOW (ref 36.0–46.0)
Hemoglobin: 8.6 g/dL — ABNORMAL LOW (ref 12.0–15.0)
MCH: 30.4 pg (ref 26.0–34.0)
MCHC: 34 g/dL (ref 30.0–36.0)
MCV: 89.4 fL (ref 80.0–100.0)
Platelets: 156 10*3/uL (ref 150–400)
RBC: 2.83 MIL/uL — ABNORMAL LOW (ref 3.87–5.11)
RDW: 15.1 % (ref 11.5–15.5)
WBC: 7.6 10*3/uL (ref 4.0–10.5)
nRBC: 0 % (ref 0.0–0.2)

## 2023-04-19 LAB — TROPONIN I (HIGH SENSITIVITY): Troponin I (High Sensitivity): 20 ng/L — ABNORMAL HIGH (ref <18)

## 2023-04-19 LAB — CULTURE, BLOOD (ROUTINE X 2): Culture: NO GROWTH

## 2023-04-19 MED ORDER — PENTAFLUOROPROP-TETRAFLUOROETH EX AERO
INHALATION_SPRAY | CUTANEOUS | Status: AC
  Filled 2023-04-19: qty 30

## 2023-04-19 MED ORDER — INSULIN DETEMIR 100 UNIT/ML ~~LOC~~ SOLN
4.0000 [IU] | Freq: Two times a day (BID) | SUBCUTANEOUS | Status: DC
  Administered 2023-04-19 – 2023-04-22 (×5): 4 [IU] via SUBCUTANEOUS
  Filled 2023-04-19 (×7): qty 0.04

## 2023-04-19 MED ORDER — IPRATROPIUM-ALBUTEROL 0.5-2.5 (3) MG/3ML IN SOLN
3.0000 mL | Freq: Two times a day (BID) | RESPIRATORY_TRACT | Status: DC
  Administered 2023-04-19 – 2023-04-22 (×7): 3 mL via RESPIRATORY_TRACT
  Filled 2023-04-19 (×7): qty 3

## 2023-04-19 MED ORDER — INSULIN ASPART 100 UNIT/ML IJ SOLN: 0.0000 [IU] | Freq: Every day | INTRAMUSCULAR | Status: DC

## 2023-04-19 MED ORDER — INSULIN ASPART 100 UNIT/ML IJ SOLN
0.0000 [IU] | Freq: Three times a day (TID) | INTRAMUSCULAR | Status: DC
  Administered 2023-04-19 – 2023-04-20 (×2): 1 [IU] via SUBCUTANEOUS
  Administered 2023-04-21: 2 [IU] via SUBCUTANEOUS
  Administered 2023-04-21 – 2023-04-22 (×3): 1 [IU] via SUBCUTANEOUS
  Filled 2023-04-19 (×4): qty 1

## 2023-04-19 NOTE — Progress Notes (Signed)
Patient's son notified this RN that patient takes 4L insulin in the morning and 4L insulin at bedtime. This RN asked patient's son if he was sure it was liters or units. Pt son repeated L. Attending notified.

## 2023-04-19 NOTE — Hospital Course (Addendum)
Taken from H&P. Deborah [name] is a 67 y.o. African-American female with medical history significant for hypertension, chronic respiratory failure with trach collar in place, uses 3 L at night and during dialysis, dyslipidemia, diabetes mellitus, and end-stage renal disease on hemodialysis on Monday Wednesday and Fridays as well as atrial fibrillation, presented to the emergency room with acute onset of feeling sick and generalized weakness with excessive sleepiness nausea or vomiting and diarrhea since yesterday.  She admits to dyspnea with associated congested cough.   ED Course: BP 152/67 with pulse symmetry of 100% on 3 L of O2 by simple mask and otherwise normal vital signs.  Labs revealed hypokalemia and a BUN of 34 and creatinine calcium 8.6 with anion gap of 16.  BNP was 1460 troponin I 21.  CBC showed A1c of 10.9 with hemoglobin 9.6 hematocrit 27.9 above previous levels.  INR was 1.7 PT 20.6 with glucose of 101. EKG  showed normal sinus rhythm with a rate of 76, QT interval with QTc of 499 and Q waves laterally with T wave inversion. Imaging: Two-view chest x-ray showed the following: 1. Cardiomegaly with central pulmonary vascular congestion. 2. Increasing patchy opacities in both lung bases with new trace bilateral pleural effusions.   Chest CT without contrast revealed the following: Cardiomegaly. Coronary artery disease. There is ectasia of the main pulmonary artery suggesting possible pulmonary arterial hypertension. Small right pleural effusion. Minimal left pleural effusion. There are patchy infiltrates in mid and lower lung fields, more so in right lower lobe suggesting atelectasis/pneumonia.  Patient was started on Rocephin and Zithromax and admitted for concern of pneumonia.  5/16; vitals stable, on 5 L with trach collar.  Leukocytosis and hypokalemia resolved.  Procalcitonin at 0.35, preliminary blood cultures negative in 12 hours and trach aspirate cultures pending.  Patient  received an extra dialysis today.  5/17: Hemodynamically stable.  Labs stable.  Saturating well on 5 L of oxygen with trach collar.  Blood cultures remain negative, trach aspirate cultures with preliminary results of Pseudomonas aeruginosa, switching ceftriaxone with cefepime.  5/18: Remained hemodynamically stable.  Coughing improving.  Oxygen saturation at baseline.  Aspirate cultures growing Pseudomonas aeruginosa and corynebacterium diphtheria, good sensitivity.  Her blood cultures 1 out of 4 became positive with gram-positive rods after 3 days of negative cultures-still awaiting ID panel, likely a contaminant.  5/19: Patient remained stable and clinically improved.  Questionable 1/4 blood culture bottle still showing gram-positive rods.  Tried contacting microbiology and they were unable to provide me with any answers.  Most likely a contaminant.  Also discussed with ID on secure chat, she is being discharged on 5 days of Levaquin-dose based on her dialysis status.  She does not have any fever or leukocytosis.  Most likely a contaminant.  She will continue the rest of her home medications and need to have a close follow-up with her providers for further recommendations.

## 2023-04-19 NOTE — Progress Notes (Signed)
Progress Note   Patient: Deborah [name] YQM:[id number] DOB: [birth year] DOA: 04/18/2023     1 DOS: the patient was seen and examined on 04/19/2023   Brief hospital course: Taken from H&P. Deborah [name] is a 67 y.o. African-American female with medical history significant for hypertension, chronic respiratory failure with trach collar in place, uses 3 L at night and during dialysis, dyslipidemia, diabetes mellitus, and end-stage renal disease on hemodialysis on Monday Wednesday and Fridays as well as atrial fibrillation, presented to the emergency room with acute onset of feeling sick and generalized weakness with excessive sleepiness nausea or vomiting and diarrhea since yesterday.  She admits to dyspnea with associated congested cough.   ED Course: BP 152/67 with pulse symmetry of 100% on 3 L of O2 by simple mask and otherwise normal vital signs.  Labs revealed hypokalemia and a BUN of 34 and creatinine calcium 8.6 with anion gap of 16.  BNP was 1460 troponin I 21.  CBC showed A1c of 10.9 with hemoglobin 9.6 hematocrit 27.9 above previous levels.  INR was 1.7 PT 20.6 with glucose of 101. EKG  showed normal sinus rhythm with a rate of 76, QT interval with QTc of 499 and Q waves laterally with T wave inversion. Imaging: Two-view chest x-ray showed the following: 1. Cardiomegaly with central pulmonary vascular congestion. 2. Increasing patchy opacities in both lung bases with new trace bilateral pleural effusions.   Chest CT without contrast revealed the following: Cardiomegaly. Coronary artery disease. There is ectasia of the main pulmonary artery suggesting possible pulmonary arterial hypertension. Small right pleural effusion. Minimal left pleural effusion. There are patchy infiltrates in mid and lower lung fields, more so in right lower lobe suggesting atelectasis/pneumonia.  Patient was started on Rocephin and Zithromax and admitted for concern of pneumonia.  5/16; vitals stable, on 5 L with  trach collar.  Leukocytosis and hypokalemia resolved.  Procalcitonin at 0.35, preliminary blood cultures negative in 12 hours and trach aspirate cultures pending.  Patient received an extra dialysis today.    Assessment and Plan: * CAP (community acquired pneumonia) X-ray concerning for pneumonia, procalcitonin at 0.35, patient has a chronic trach collar, trach collar respirate cultures pending, preliminary blood cultures negative in 12 hours. - Will continue antibiotic therapy with IV Rocephin and Zithromax. - Supportive care -Follow-up cultures  Fluid overload Patient did miss 1 dialysis on Monday.  Chest x-ray with concern of some pulmonary edema, went for an extra session today. -Continue to monitor  Acute respiratory failure with hypoxia ([name]) Patient initially required up to 5 L of oxygen with 3 L of baseline use, now back to baseline of 3 L through trach collar. -Continue to  Hypokalemia Replaced  Essential hypertension - We will continue her antihypertensives.  Dyslipidemia - We will continue statin therapy.  Depression - We will continue Lexapro and trazodone.  Type 2 diabetes mellitus with peripheral neuropathy ([name]) - The patient will be placed on supplemental coverage with NovoLog. - We will continue basal coverage. - We will continue Neurontin.  Paroxysmal atrial fibrillation ([name]) No acute concern. -Continue carvedilol and Eliquis   Subjective: Patient was seen during dialysis today.  No new concern.  Still having some cough but shortness of breath has improved.  She is currently at baseline oxygen requirement of 3 L  Physical Exam: Vitals:   04/19/23 1200 04/19/23 1230 04/19/23 1300 04/19/23 1330  BP: 135/63 (!) 145/59 133/69 (!) 154/60  Pulse: 67 63 65 65  Resp: 16  16 15 15   Temp:      TempSrc:      SpO2: 100% 96% 100% 99%  Weight:       General.  Well-developed lady, in no acute distress.  Trach collar in place Pulmonary.  Lungs clear  bilaterally, normal respiratory effort. CV.  Regular rate and rhythm, no JVD, rub or murmur. Abdomen.  Soft, nontender, nondistended, BS positive. CNS.  Alert and oriented .  No focal neurologic deficit. Extremities.  No edema, no cyanosis, pulses intact and symmetrical. Psychiatry.  Judgment and insight appears normal.   Data Reviewed: Prior data reviewed  Family Communication: Discussed with patient  Disposition: Status is: Inpatient Remains inpatient appropriate because: Severity of illness  Planned Discharge Destination: Home  DVT prophylaxis.  Eliquis Time spent: 50 minutes  This record has been created using Conservation officer, historic buildings. Errors have been sought and corrected,but may not always be located. Such creation errors do not reflect on the standard of care.   Author: Arnetha Courser, MD 04/19/2023 1:48 PM  For on call review [email or web address].

## 2023-04-19 NOTE — Assessment & Plan Note (Signed)
No acute concern. -Continue carvedilol and Eliquis

## 2023-04-19 NOTE — Plan of Care (Signed)
Patient A&Ox4, from home, up with assist in room. Patient had dialysis 5/16 with 2L taken off. Patient c/o nausea and pain at IV site while giving Azithromycin. IV site assessed by this RN, IV patent, no swelling or redness. Patient given ice pack and zofran.

## 2023-04-19 NOTE — Progress Notes (Signed)
[facility]  ROUNDING NOTE   Subjective:   Deborah [name] was admitted to [facility] on 04/18/2023 for CAP (community acquired pneumonia) [J18.9] Community acquired pneumonia, unspecified laterality [J18.9]  Last hemodialysis treatment was yesterday. She missed Monday due to staffing issues at the dialysis clinic.   Patient left the kidney at 62.2kg. Her dry weight is 62kg.   Patient found to have pneumonia and started on broad spectrum antibiotics.   Patient placed on extra hemodialysis treatment today for clearance.    Objective:  Vital signs in last 24 hours:  Temp:  [97.6 F (36.4 C)-98.7 F (37.1 C)] 98.6 F (37 C) (05/16 1119) Pulse Rate:  [68-77] 69 (05/16 1134) Resp:  [18-20] 19 (05/16 1134) BP: (122-172)/(53-110) 122/53 (05/16 1134) SpO2:  [92 %-100 %] 92 % (05/16 1134) FiO2 (%):  [28 %] 28 % (05/16 0807) Weight:  [64.4 kg] 64.4 kg (05/16 1119)  Weight change:  Filed Weights   04/19/23 1119  Weight: 64.4 kg    Intake/Output: I/O last 3 completed shifts: In: 250.1 [IV Piggyback:250.1] Out: -    Intake/Output this shift:  Total I/O In: 240 [P.O.:240] Out: -   Physical Exam: General: NAD,   Head: Normocephalic, atraumatic. Moist oral mucosal membranes  Eyes: Anicteric, PERRL  Neck: Supple, trachea midline  Lungs:  Clear to auscultation  Heart: Regular rate and rhythm  Abdomen:  Soft, nontender,   Extremities:  no peripheral edema.  Neurologic: Nonfocal, moving all four extremities  Skin: No lesions  Access: Left AVG    Basic Metabolic Panel: Recent Labs  Lab 04/18/23 1527 04/18/23 1958 04/19/23 0524  NA 139  --  140  K 3.0*  --  3.7  CL 95*  --  97*  CO2 28  --  29  GLUCOSE 101*  --  152*  BUN 34*  --  46*  CREATININE 5.25*  --  7.59*  CALCIUM 8.6*  --  8.0*  MG  --  1.8  --     Liver Function Tests: No results for input(s): "AST", "ALT", "ALKPHOS", "BILITOT", "PROT", "ALBUMIN" in the last 168 hours. No results for  input(s): "LIPASE", "AMYLASE" in the last 168 hours. No results for input(s): "AMMONIA" in the last 168 hours.  CBC: Recent Labs  Lab 04/18/23 1527 04/19/23 0524  WBC 10.9* 7.6  HGB 9.6* 8.6*  HCT 27.9* 25.3*  MCV 88.0 89.4  PLT 187 156    Cardiac Enzymes: No results for input(s): "CKTOTAL", "CKMB", "CKMBINDEX", "TROPONINI" in the last 168 hours.  BNP: Invalid input(s): "POCBNP"  CBG: No results for input(s): "GLUCAP" in the last 168 hours.  Microbiology: Results for orders placed or performed during the hospital encounter of 04/18/23  SARS Coronavirus 2 by RT PCR (hospital order, performed in [facility] hospital lab) *cepheid single result test* Anterior Nasal Swab     Status: None   Collection Time: 04/18/23  7:34 PM   Specimen: Anterior Nasal Swab  Result Value Ref Range Status   SARS Coronavirus 2 by RT PCR NEGATIVE NEGATIVE Final    Comment: (NOTE) SARS-CoV-2 target nucleic acids are NOT DETECTED.  The SARS-CoV-2 RNA is generally detectable in upper and lower respiratory specimens during the acute phase of infection. The lowest concentration of SARS-CoV-2 viral copies this assay can detect is 250 copies / mL. A negative result does not preclude SARS-CoV-2 infection and should not be used as the sole basis for treatment or other patient management decisions.  A negative  result may occur with improper specimen collection / handling, submission of specimen other than nasopharyngeal swab, presence of viral mutation(s) within the areas targeted by this assay, and inadequate number of viral copies (<250 copies / mL). A negative result must be combined with clinical observations, patient history, and epidemiological information.  Fact Sheet for Patients:   RoadLapTop.co.za  Fact Sheet for Healthcare Providers: http://kim-miller.com/  This test is not yet approved or  cleared by the [place] FDA and has been  authorized for detection and/or diagnosis of SARS-CoV-2 by FDA under an Emergency Use Authorization (EUA).  This EUA will remain in effect (meaning this test can be used) for the duration of the COVID-19 declaration under Section 564(b)(1) of the Act, 21 U.S.C. section 360bbb-3(b)(1), unless the authorization is terminated or revoked sooner.  Performed at [facility], [address] [place], [place] [address]   Blood culture (routine x 2)     Status: None (Preliminary result)   Collection Time: 04/18/23  8:20 PM   Specimen: BLOOD  Result Value Ref Range Status   Specimen Description BLOOD RIGHT ANTECUBITAL  Final   Special Requests   Final    BOTTLES DRAWN AEROBIC AND ANAEROBIC Blood Culture adequate volume   Culture   Final    NO GROWTH < 12 HOURS Performed at [facility], [address], [place], [place] [address]    Report Status PENDING  Incomplete  Blood culture (routine x 2)     Status: None (Preliminary result)   Collection Time: 04/18/23  8:51 PM   Specimen: BLOOD  Result Value Ref Range Status   Specimen Description BLOOD BLOOD RIGHT HAND  Final   Special Requests   Final    BOTTLES DRAWN AEROBIC AND ANAEROBIC Blood Culture adequate volume   Culture   Final    NO GROWTH < 12 HOURS Performed at [facility], [address], [place], [place] [address]    Report Status PENDING  Incomplete  Culture, Respiratory w Gram Stain     Status: None (Preliminary result)   Collection Time: 04/18/23 10:20 PM   Specimen: Tracheal Aspirate; Respiratory  Result Value Ref Range Status   Specimen Description   Final    TRACHEAL ASPIRATE Performed at [facility], [address] [place], [place] [address]    Special Requests   Final    Normal Performed at [facility], [address] [place], [place] [address]    Gram Stain   Final    RARE WBC PRESENT, PREDOMINANTLY PMN FEW GRAM POSITIVE RODS FEW GRAM NEGATIVE RODS RARE GRAM  POSITIVE COCCI Performed at [facility], [address], [place], [place] [address]    Culture PENDING  Incomplete   Report Status PENDING  Incomplete    Coagulation Studies: Recent Labs    04/18/23 1527  LABPROT 20.6*  INR 1.7*    Urinalysis: No results for input(s): "COLORURINE", "LABSPEC", "PHURINE", "GLUCOSEU", "HGBUR", "BILIRUBINUR", "KETONESUR", "PROTEINUR", "UROBILINOGEN", "NITRITE", "LEUKOCYTESUR" in the last 72 hours.  Invalid input(s): "APPERANCEUR"    Imaging: CT Chest Wo Contrast  Result Date: 04/18/2023 CLINICAL DATA:  Shortness of breath EXAM: CT CHEST WITHOUT CONTRAST TECHNIQUE: Multidetector CT imaging of the chest was performed following the standard protocol without IV contrast. RADIATION DOSE REDUCTION: This exam was performed according to the departmental dose-optimization program which includes automated exposure control, adjustment of the mA and/or kV according to patient size and/or use of iterative reconstruction technique. COMPARISON:  Previous  studies including the CT done on 2020/11/26 and chest radiograph done earlier today FINDINGS: Cardiovascular: There is ectasia of the main pulmonary artery measuring 3.6 cm suggesting pulmonary arterial hypertension. Coronary artery calcifications are seen. 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 Heart is enlarged in size. Metallic sutures are seen in the sternum. Mediastinum/Nodes: There are slightly enlarged nodes in mediastinum. This finding has not changed significantly. Lungs/Pleura: Tip of tracheostomy tube is slightly above the level of aortic arch. Small right pleural effusion is seen. Minimal left pleural effusion is seen. There are patchy infiltrates in both perihilar regions and both lower lung fields, more so in the right lower lobe suggesting atelectasis/pneumonia. There is no pneumothorax. Upper Abdomen: Small hiatal hernia is seen. There are small calcifications in liver and spleen, possibly granulomas.  Musculoskeletal: Sclerotic density seen in the spinous process of T2 vertebra has not changed, possibly benign bone island. There is subcutaneous edema and skin thickening in both breasts without any loculated fluid collections. Degenerative changes are noted in the visualized lower cervical spine with encroachment of neural foramina. IMPRESSION: Cardiomegaly. Coronary artery disease. There is ectasia of the main pulmonary artery suggesting possible pulmonary arterial hypertension. Small right pleural effusion. Minimal left pleural effusion. There are patchy infiltrates in mid and lower lung fields, more so in right lower lobe suggesting atelectasis/pneumonia. Small hiatal hernia. Other findings as described in the body of the report. Electronically Signed   By: Ernie Avena M.D.   On: 04/18/2023 20:23   DG Chest 2 View  Result Date: 04/18/2023 CLINICAL DATA:  Shortness of breath EXAM: CHEST - 2 VIEW COMPARISON:  Chest x-ray 01/04/2022 FINDINGS: Tracheostomy is unchanged in position. Patient is status post heart valve replacement. The heart is enlarged. There central pulmonary vascular congestion. There are increasing patchy opacities in both lung bases with new trace bilateral pleural effusions. No pneumothorax or acute fracture. Vascular stent/graft seen in the proximal left arm. IMPRESSION: 1. Cardiomegaly with central pulmonary vascular congestion. 2. Increasing patchy opacities in both lung bases with new trace bilateral pleural effusions. Electronically Signed   By: Darliss Cheney M.D.   On: 04/18/2023 16:04     Medications:    azithromycin 500 mg (04/19/23 0112)   cefTRIAXone (ROCEPHIN)  IV      amLODipine  10 mg Oral Daily   apixaban  2.5 mg Oral BID   aspirin EC  81 mg Oral Daily   atorvastatin  80 mg Oral q1800   carvedilol  25 mg Oral BID WC   escitalopram  10 mg Oral Daily   furosemide  40 mg Intravenous Q12H   gabapentin  300 mg Oral QHS   guaiFENesin  600 mg Oral BID    insulin detemir  13 Units Subcutaneous QHS   ipratropium-albuterol  3 mL Nebulization BID   irbesartan  37.5 mg Oral Daily   [START ON 04/20/2023] midodrine  5 mg Oral Once per day on Mon Wed Fri   pantoprazole  40 mg Oral Daily   sevelamer carbonate  1,600 mg Oral TID WC   acetaminophen **OR** acetaminophen, chlorpheniramine-HYDROcodone, magnesium hydroxide, ondansetron **OR** ondansetron (ZOFRAN) IV, traZODone  Assessment/ Plan:  Deborah [name] is a 67 y.o.  female with end stage renal disease on hemodialysis, tracheostomy, hypertension, hyperlipidemia, diabetes mellitus type II insulin dependent, depression, and diabetic neuropathy who presents to [facility] for 04/18/2023 for CAP (community acquired pneumonia) [J18.9] Community acquired pneumonia, unspecified laterality [J18.9]  [facility] T[facility] [address] Left AVG 62kg  End Stage Kidney Disease Hypertension Diabetes mellitus type II with chronic kidney disease Secondary Hyperparathyroidism Anemia of chronic kidney disease   LOS: 1 Jaidev Sanger 5/16/202411:48 AM

## 2023-04-20 DIAGNOSIS — J189 Pneumonia, unspecified organism: Secondary | ICD-10-CM | POA: Diagnosis not present

## 2023-04-20 DIAGNOSIS — I480 Paroxysmal atrial fibrillation: Secondary | ICD-10-CM

## 2023-04-20 DIAGNOSIS — I10 Essential (primary) hypertension: Secondary | ICD-10-CM | POA: Diagnosis not present

## 2023-04-20 DIAGNOSIS — E8770 Fluid overload, unspecified: Secondary | ICD-10-CM | POA: Diagnosis not present

## 2023-04-20 LAB — CBC
HCT: 24.7 % — ABNORMAL LOW (ref 36.0–46.0)
Hemoglobin: 8.3 g/dL — ABNORMAL LOW (ref 12.0–15.0)
MCH: 30.5 pg (ref 26.0–34.0)
MCHC: 33.6 g/dL (ref 30.0–36.0)
MCV: 90.8 fL (ref 80.0–100.0)
Platelets: 162 10*3/uL (ref 150–400)
RBC: 2.72 MIL/uL — ABNORMAL LOW (ref 3.87–5.11)
RDW: 15.2 % (ref 11.5–15.5)
WBC: 7.4 10*3/uL (ref 4.0–10.5)
nRBC: 0 % (ref 0.0–0.2)

## 2023-04-20 LAB — HEMOGLOBIN A1C
Hgb A1c MFr Bld: 7.2 % — ABNORMAL HIGH (ref 4.8–5.6)
Mean Plasma Glucose: 159.94 mg/dL

## 2023-04-20 LAB — BASIC METABOLIC PANEL
Anion gap: 10 (ref 5–15)
BUN: 33 mg/dL — ABNORMAL HIGH (ref 8–23)
CO2: 29 mmol/L (ref 22–32)
Calcium: 8.1 mg/dL — ABNORMAL LOW (ref 8.9–10.3)
Chloride: 98 mmol/L (ref 98–111)
Creatinine, Ser: 5.49 mg/dL — ABNORMAL HIGH (ref 0.44–1.00)
GFR, Estimated: 8 mL/min — ABNORMAL LOW (ref >60)
Glucose, Bld: 110 mg/dL — ABNORMAL HIGH (ref 70–99)
Potassium: 4.4 mmol/L (ref 3.5–5.1)
Sodium: 137 mmol/L (ref 135–145)

## 2023-04-20 LAB — GLUCOSE, CAPILLARY
Glucose-Capillary: 94 mg/dL (ref 70–99)
Glucose-Capillary: 152 mg/dL — ABNORMAL HIGH (ref 70–99)
Glucose-Capillary: 191 mg/dL — ABNORMAL HIGH (ref 70–99)

## 2023-04-20 LAB — HIV ANTIBODY (ROUTINE TESTING W REFLEX): HIV Screen 4th Generation wRfx: NONREACTIVE

## 2023-04-20 LAB — HEPATITIS B SURFACE ANTIBODY, QUANTITATIVE: Hep B S AB Quant (Post): 1964 m[IU]/mL (ref >9.9)

## 2023-04-20 LAB — CULTURE, RESPIRATORY W GRAM STAIN

## 2023-04-20 MED ORDER — EPOETIN ALFA 10000 UNIT/ML IJ SOLN
INTRAMUSCULAR | Status: AC
  Filled 2023-04-20: qty 1

## 2023-04-20 MED ORDER — EPOETIN ALFA 10000 UNIT/ML IJ SOLN: 10000.0000 [IU] | INTRAMUSCULAR | Status: DC

## 2023-04-20 MED ORDER — SODIUM CHLORIDE 0.9 % IV SOLN
1.0000 g | INTRAVENOUS | Status: DC
  Administered 2023-04-20 – 2023-04-22 (×3): 1 g via INTRAVENOUS
  Filled 2023-04-20 (×3): qty 10

## 2023-04-20 MED ORDER — EPOETIN ALFA 10000 UNIT/ML IJ SOLN
10000.0000 [IU] | INTRAMUSCULAR | Status: DC
  Administered 2023-04-20: 10000 [IU] via INTRAVENOUS

## 2023-04-20 NOTE — Progress Notes (Signed)
  Received patient in bed to unit.   Informed consent signed and in chart.    TX duration:3 hrs     Transported back to floor  Hand-off given to patient's nurse. No c/o and no distress noted    Access used: LAVG Access issues: none   Total UF removed: 0kg Medication(s) given: 10,000u epogen Post HD VS: 145/54 Post HD weight: 62.4kg     Lynann Beaver  [facility]

## 2023-04-20 NOTE — Progress Notes (Signed)
Progress Note   Patient: Deborah [name] UJW:[id number] DOB: 1956-07-03 DOA: 04/18/2023     2 DOS: the patient was seen and examined on 04/20/2023   Brief hospital course: Taken from H&P. ebra MILTA ROVITO is a 67 y.o. African-American female with medical history significant for hypertension, chronic respiratory failure with trach collar in place, uses 3 L at night and during dialysis, dyslipidemia, diabetes mellitus, and end-stage renal disease on hemodialysis on Monday Wednesday and Fridays as well as atrial fibrillation, presented to the emergency room with acute onset of feeling sick and generalized weakness with excessive sleepiness nausea or vomiting and diarrhea since yesterday.  She admits to dyspnea with associated congested cough.   ED Course: BP 152/67 with pulse symmetry of 100% on 3 L of O2 by simple mask and otherwise normal vital signs.  Labs revealed hypokalemia and a BUN of 34 and creatinine calcium 8.6 with anion gap of 16.  BNP was 1460 troponin I 21.  CBC showed A1c of 10.9 with hemoglobin 9.6 hematocrit 27.9 above previous levels.  INR was 1.7 PT 20.6 with glucose of 101. EKG  showed normal sinus rhythm with a rate of 76, QT interval with QTc of 499 and Q waves laterally with T wave inversion. Imaging: Two-view chest x-ray showed the following: 1. Cardiomegaly with central pulmonary vascular congestion. 2. Increasing patchy opacities in both lung bases with new trace bilateral pleural effusions.   Chest CT without contrast revealed the following: Cardiomegaly. Coronary artery disease. There is ectasia of the main pulmonary artery suggesting possible pulmonary arterial hypertension. Small right pleural effusion. Minimal left pleural effusion. There are patchy infiltrates in mid and lower lung fields, more so in right lower lobe suggesting atelectasis/pneumonia.  Patient was started on Rocephin and Zithromax and admitted for concern of pneumonia.  5/16; vitals stable, on 5 L with  trach collar.  Leukocytosis and hypokalemia resolved.  Procalcitonin at 0.35, preliminary blood cultures negative in 12 hours and trach aspirate cultures pending.  Patient received an extra dialysis today.  5/17: Hemodynamically stable.  Labs stable.  Saturating well on 5 L of oxygen with trach collar.  Blood cultures remain negative, trach aspirate cultures with preliminary results of Pseudomonas aeruginosa, switching ceftriaxone with cefepime.   Assessment and Plan: * CAP (community acquired pneumonia) X-ray concerning for pneumonia, procalcitonin at 0.35, patient has a chronic trach collar, trach collar respirate cultures with Pseudomonas aeruginosa, preliminary blood cultures negative in 12 hours. -Switch ceftriaxone with cefepime - Continue with Zithromax. - Supportive care -Follow-up cultures  Fluid overload Patient did miss 1 dialysis on Monday.  Chest x-ray with concern of some pulmonary edema, went for an extra session today. -Continue to monitor  Acute respiratory failure with hypoxia ([name]) Patient initially required up to 5 L of oxygen with 3 L of baseline use, now back to baseline of 3 L through trach collar. -Continue to  Hypokalemia Replaced  Essential hypertension - We will continue her antihypertensives.  Dyslipidemia - We will continue statin therapy.  Depression - We will continue Lexapro and trazodone.  Type 2 diabetes mellitus with peripheral neuropathy ([name]) - The patient will be placed on supplemental coverage with NovoLog. - We will continue basal coverage. - We will continue Neurontin.  Paroxysmal atrial fibrillation ([name]) No acute concern. -Continue carvedilol and Eliquis   Subjective: Patient is still not feeling well and having some nausea when seen today.  Continue to have significant tracheal secretions.  Physical Exam: Vitals:   04/20/23 1345  04/20/23 1400 04/20/23 1429 04/20/23 1454  BP: (!) 146/61 (!) 147/68 (!) 148/64   Pulse: 76 77 77    Resp: (!) 24 (!) 26 (!) 21   Temp:      TempSrc:      SpO2: (!) 83% 90% 92%   Weight:    62.4 kg   General.  Ill-appearing lady, in no acute distress.  Trach collar in place Pulmonary.  Lungs clear bilaterally, normal respiratory effort. CV.  Regular rate and rhythm, no JVD, rub or murmur. Abdomen.  Soft, nontender, nondistended, BS positive. CNS.  Alert and oriented .  No focal neurologic deficit. Extremities.  No edema, no cyanosis, pulses intact and symmetrical. Psychiatry.  Judgment and insight appears normal.   Data Reviewed: Prior data reviewed  Family Communication: Discussed with patient  Disposition: Status is: Inpatient Remains inpatient appropriate because: Severity of illness  Planned Discharge Destination: Home  DVT prophylaxis.  Eliquis Time spent: 45 minutes  This record has been created using Conservation officer, historic buildings. Errors have been sought and corrected,but may not always be located. Such creation errors do not reflect on the standard of care.   Author: Arnetha Courser, MD 04/20/2023 3:42 PM  For on call review [email or web address].

## 2023-04-20 NOTE — Progress Notes (Signed)
[facility]  ROUNDING NOTE   Subjective:   [name] was admitted to [facility] on 04/18/2023 for CAP (community acquired pneumonia) [J18.9] Community acquired pneumonia, unspecified laterality [J18.9]  Seen and examined on hemodialysis treatment.     HEMODIALYSIS FLOWSHEET:  Blood Flow Rate (mL/min): 400 mL/min Arterial Pressure (mmHg): -140 mmHg Venous Pressure (mmHg): 200 mmHg TMP (mmHg): 5 mmHg Ultrafiltration Rate (mL/min): 267 mL/min Dialysate Flow Rate (mL/min): 300 ml/min    Objective:  Vital signs in last 24 hours:  Temp:  [97.5 F (36.4 C)-98.6 F (37 C)] 98.4 F (36.9 C) (05/17 1113) Pulse Rate:  [63-95] 67 (05/17 1230) Resp:  [12-27] 20 (05/17 1230) BP: (101-154)/(53-74) 131/61 (05/17 1230) SpO2:  [86 %-100 %] 86 % (05/17 1230) FiO2 (%):  [28 %] 28 % (05/17 0824) Weight:  [62.4 kg-62.5 kg] 62.4 kg (05/17 1130)  Weight change:  Filed Weights   04/19/23 1119 04/19/23 1437 04/20/23 1130  Weight: 64.4 kg 62.5 kg 62.4 kg    Intake/Output: I/O last 3 completed shifts: In: 490.1 [P.O.:240; IV Piggyback:250.1] Out: 2000 [Other:2000]   Intake/Output this shift:  Total I/O In: 240 [P.O.:240] Out: -   Physical Exam: General: NAD, laying in bed  Head: Normocephalic, atraumatic. Moist oral mucosal membranes  Eyes: Anicteric, PERRL  Neck: +tracheostomy  Lungs:  Clear to auscultation  Heart: Regular rate and rhythm  Abdomen:  Soft, nontender,   Extremities:  no peripheral edema.  Neurologic: Nonfocal, moving all four extremities  Skin: No lesions  Access: Left AVG    Basic Metabolic Panel: Recent Labs  Lab 04/18/23 1527 04/18/23 1958 04/19/23 0524 04/20/23 0456  NA 139  --  140 137  K 3.0*  --  3.7 4.4  CL 95*  --  97* 98  CO2 28  --  29 29  GLUCOSE 101*  --  152* 110*  BUN 34*  --  46* 33*  CREATININE 5.25*  --  7.59* 5.49*  CALCIUM 8.6*  --  8.0* 8.1*  MG  --  1.8  --   --      Liver Function Tests: No results for  input(s): "AST", "ALT", "ALKPHOS", "BILITOT", "PROT", "ALBUMIN" in the last 168 hours. No results for input(s): "LIPASE", "AMYLASE" in the last 168 hours. No results for input(s): "AMMONIA" in the last 168 hours.  CBC: Recent Labs  Lab 04/18/23 1527 04/19/23 0524 04/20/23 0456  WBC 10.9* 7.6 7.4  HGB 9.6* 8.6* 8.3*  HCT 27.9* 25.3* 24.7*  MCV 88.0 89.4 90.8  PLT 187 156 162     Cardiac Enzymes: No results for input(s): "CKTOTAL", "CKMB", "CKMBINDEX", "TROPONINI" in the last 168 hours.  BNP: Invalid input(s): "POCBNP"  CBG: Recent Labs  Lab 04/19/23 1745 04/19/23 2124 04/20/23 0734  GLUCAP 179* 197* 94    Microbiology: Results for orders placed or performed during the hospital encounter of 04/18/23  SARS Coronavirus 2 by RT PCR (hospital order, performed in [facility] hospital lab) *cepheid single result test* Anterior Nasal Swab     Status: None   Collection Time: 04/18/23  7:34 PM   Specimen: Anterior Nasal Swab  Result Value Ref Range Status   SARS Coronavirus 2 by RT PCR NEGATIVE NEGATIVE Final    Comment: (NOTE) SARS-CoV-2 target nucleic acids are NOT DETECTED.  The SARS-CoV-2 RNA is generally detectable in upper and lower respiratory specimens during the acute phase of infection. The lowest concentration of SARS-CoV-2 viral copies this assay can detect is 250  copies / mL. A negative result does not preclude SARS-CoV-2 infection and should not be used as the sole basis for treatment or other patient management decisions.  A negative result may occur with improper specimen collection / handling, submission of specimen other than nasopharyngeal swab, presence of viral mutation(s) within the areas targeted by this assay, and inadequate number of viral copies (<250 copies / mL). A negative result must be combined with clinical observations, patient history, and epidemiological information.  Fact Sheet for Patients:    RoadLapTop.co.za  Fact Sheet for Healthcare Providers: http://kim-miller.com/  This test is not yet approved or  cleared by the [place] FDA and has been authorized for detection and/or diagnosis of SARS-CoV-2 by FDA under an Emergency Use Authorization (EUA).  This EUA will remain in effect (meaning this test can be used) for the duration of the COVID-19 declaration under Section 564(b)(1) of the Act, 21 U.S.C. section 360bbb-3(b)(1), unless the authorization is terminated or revoked sooner.  Performed at [facility], [address] [place], [place] [address]   Blood culture (routine x 2)     Status: None (Preliminary result)   Collection Time: 04/18/23  8:20 PM   Specimen: BLOOD  Result Value Ref Range Status   Specimen Description BLOOD RIGHT ANTECUBITAL  Final   Special Requests   Final    BOTTLES DRAWN AEROBIC AND ANAEROBIC Blood Culture adequate volume   Culture   Final    NO GROWTH 2 DAYS Performed at [facility], [address], [place], [place] [address]    Report Status PENDING  Incomplete  Blood culture (routine x 2)     Status: None (Preliminary result)   Collection Time: 04/18/23  8:51 PM   Specimen: BLOOD  Result Value Ref Range Status   Specimen Description BLOOD BLOOD RIGHT HAND  Final   Special Requests   Final    BOTTLES DRAWN AEROBIC AND ANAEROBIC Blood Culture adequate volume   Culture   Final    NO GROWTH 2 DAYS Performed at [facility], [address] [place], [place] [address]    Report Status PENDING  Incomplete  Culture, Respiratory w Gram Stain     Status: None (Preliminary result)   Collection Time: 04/18/23 10:20 PM   Specimen: Tracheal Aspirate; Respiratory  Result Value Ref Range Status   Specimen Description   Final    TRACHEAL ASPIRATE Performed at [facility], [address] [place], [place] [address]    Special Requests   Final     Normal Performed at [facility], [address] [place], [place] [address]    Gram Stain   Final    RARE WBC PRESENT, PREDOMINANTLY PMN FEW GRAM POSITIVE RODS FEW GRAM NEGATIVE RODS RARE GRAM POSITIVE COCCI Performed at [facility], [address] [place], [place] [address]    Culture PENDING  Incomplete   Report Status PENDING  Incomplete    Coagulation Studies: Recent Labs    04/18/23 1527  LABPROT 20.6*  INR 1.7*     Urinalysis: No results for input(s): "COLORURINE", "LABSPEC", "PHURINE", "GLUCOSEU", "HGBUR", "BILIRUBINUR", "KETONESUR", "PROTEINUR", "UROBILINOGEN", "NITRITE", "LEUKOCYTESUR" in the last 72 hours.  Invalid input(s): "APPERANCEUR"    Imaging: CT Chest Wo Contrast  Result Date: 04/18/2023 CLINICAL DATA:  Shortness of breath EXAM: CT CHEST WITHOUT CONTRAST TECHNIQUE: Multidetector CT imaging of the chest was performed following the standard protocol without IV contrast. RADIATION DOSE REDUCTION: This exam was performed according  to the departmental dose-optimization program which includes automated exposure control, adjustment of the mA and/or kV according to patient size and/or use of iterative reconstruction technique. COMPARISON:  Previous studies including the CT done on 12-02-20 and chest radiograph done earlier today FINDINGS: Cardiovascular: There is ectasia of the main pulmonary artery measuring 3.6 cm suggesting pulmonary arterial hypertension. Coronary artery calcifications are seen. 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 Heart is enlarged in size. Metallic sutures are seen in the sternum. Mediastinum/Nodes: There are slightly enlarged nodes in mediastinum. This finding has not changed significantly. Lungs/Pleura: Tip of tracheostomy tube is slightly above the level of aortic arch. Small right pleural effusion is seen. Minimal left pleural effusion is seen. There are patchy infiltrates in both perihilar regions and both lower lung fields, more  so in the right lower lobe suggesting atelectasis/pneumonia. There is no pneumothorax. Upper Abdomen: Small hiatal hernia is seen. There are small calcifications in liver and spleen, possibly granulomas. Musculoskeletal: Sclerotic density seen in the spinous process of T2 vertebra has not changed, possibly benign bone island. There is subcutaneous edema and skin thickening in both breasts without any loculated fluid collections. Degenerative changes are noted in the visualized lower cervical spine with encroachment of neural foramina. IMPRESSION: Cardiomegaly. Coronary artery disease. There is ectasia of the main pulmonary artery suggesting possible pulmonary arterial hypertension. Small right pleural effusion. Minimal left pleural effusion. There are patchy infiltrates in mid and lower lung fields, more so in right lower lobe suggesting atelectasis/pneumonia. Small hiatal hernia. Other findings as described in the body of the report. Electronically Signed   By: Ernie Avena M.D.   On: 04/18/2023 20:23   DG Chest 2 View  Result Date: 04/18/2023 CLINICAL DATA:  Shortness of breath EXAM: CHEST - 2 VIEW COMPARISON:  Chest x-ray 01/04/2022 FINDINGS: Tracheostomy is unchanged in position. Patient is status post heart valve replacement. The heart is enlarged. There central pulmonary vascular congestion. There are increasing patchy opacities in both lung bases with new trace bilateral pleural effusions. No pneumothorax or acute fracture. Vascular stent/graft seen in the proximal left arm. IMPRESSION: 1. Cardiomegaly with central pulmonary vascular congestion. 2. Increasing patchy opacities in both lung bases with new trace bilateral pleural effusions. Electronically Signed   By: Darliss Cheney M.D.   On: 04/18/2023 16:04     Medications:    azithromycin 500 mg (04/19/23 2138)   cefTRIAXone (ROCEPHIN)  IV 2 g (04/19/23 1813)    amLODipine  10 mg Oral Daily   apixaban  2.5 mg Oral BID   aspirin EC  81 mg  Oral Daily   atorvastatin  80 mg Oral q1800   carvedilol  25 mg Oral BID WC   escitalopram  10 mg Oral Daily   furosemide  40 mg Intravenous Q12H   gabapentin  300 mg Oral QHS   guaiFENesin  600 mg Oral BID   insulin aspart  0-5 Units Subcutaneous QHS   insulin aspart  0-6 Units Subcutaneous TID WC   insulin detemir  4 Units Subcutaneous BID   ipratropium-albuterol  3 mL Nebulization BID   irbesartan  37.5 mg Oral Daily   midodrine  5 mg Oral Once per day on Mon Wed Fri   pantoprazole  40 mg Oral Daily   sevelamer carbonate  1,600 mg Oral TID WC   acetaminophen **OR** acetaminophen, chlorpheniramine-HYDROcodone, magnesium hydroxide, ondansetron **OR** ondansetron (ZOFRAN) IV, traZODone  Assessment/ Plan:  [name] is a 67 y.o.  female with end  stage renal disease on hemodialysis, tracheostomy, hypertension, hyperlipidemia, diabetes mellitus type II insulin dependent, depression, and diabetic neuropathy who presents to [facility] for 04/18/2023 for CAP (community acquired pneumonia) [J18.9] Community acquired pneumonia, unspecified laterality [J18.9]  [facility] MWF [address] Left AVG 62kg  End Stage Kidney Disease: seen and examined on hemodialysis treatment. Tolerating treatment well. - MWF schedule.   Hypertension with chronic kidney disease: current regimen of irbesartan, carvedilol and amlodipine. Home regimen of olmesartan instead of irbesartan.  - does require midodrine to prevent hypotensive episodes on hemodialysis.  Diabetes mellitus type II with chronic kidney disease: insulin dependent. Continue glucose control  Secondary Hyperparathyroidism - Continue sevelamer with meals.   Anemia of chronic kidney disease: hemoglobin 8.3 - EPO with HD treatment.    LOS: 2 Marquise Wicke 5/17/202412:58 PM

## 2023-04-20 NOTE — Plan of Care (Signed)
Patient A&Ox4, from home, SBA in room. IV abx maintained. No significant changes this shift.

## 2023-04-20 NOTE — Assessment & Plan Note (Signed)
X-ray concerning for pneumonia, procalcitonin at 0.35, patient has a chronic trach collar, trach collar respirate cultures with Pseudomonas aeruginosa, preliminary blood cultures negative in 12 hours. -Switch ceftriaxone with cefepime - Continue with Zithromax. - Supportive care -Follow-up cultures

## 2023-04-20 NOTE — Discharge Planning (Signed)
ESTBLISHED HEMODIALYSIS Outpatient Facility  [facility]  [address]  [place] [address] [phone]  Scheduled days: Monday Wednesday and Friday  Treatment time: 10:15am

## 2023-04-20 NOTE — Care Management Important Message (Signed)
Important Message  Patient Details  Name: Deborah [name] MRN: [id number] Date of Birth: 07/06/1956   Medicare Important Message Given:  N/A - LOS <3 / Initial given by admissions     Deborah [name] 04/20/2023, 11:37 AM

## 2023-04-20 NOTE — Progress Notes (Signed)
Pharmacy Antibiotic Note  Deborah [name] is a 67 y.o. female w/ PMH of HTN, chronic respiratory failure with trach collar in place, DM, ESRD/HD, atrial fibrillation admitted on 04/18/2023 with pneumonia.  Pharmacy has been consulted for cefepime dosing.  Plan: start cefepime 1 gram IV every 24 hours (timed for after HD sessions)  Weight: 62.4 kg (137 lb 9.1 oz)  Temp (24hrs), Avg:98.3 F (36.8 C), Min:98 F (36.7 C), Max:98.6 F (37 C)  Recent Labs  Lab 04/18/23 1527 04/19/23 0524 04/20/23 0456  WBC 10.9* 7.6 7.4  CREATININE 5.25* 7.59* 5.49*    CrCl cannot be calculated (Unknown ideal weight.).    No Known Allergies  Antimicrobials this admission: 05/16 azithromycin >> (05/19) 05/15 ceftriaxone >> 05/17 05/17 cefepime >>   Microbiology results: 05/15 BCx: NG x 2 days 05/15 Sputum: P aeruginosa    Thank you for allowing pharmacy to be a part of this patient's care.  Lowella Bandy 04/20/2023 3:46 PM

## 2023-04-21 DIAGNOSIS — J189 Pneumonia, unspecified organism: Secondary | ICD-10-CM | POA: Diagnosis not present

## 2023-04-21 DIAGNOSIS — E8770 Fluid overload, unspecified: Secondary | ICD-10-CM | POA: Diagnosis not present

## 2023-04-21 DIAGNOSIS — I480 Paroxysmal atrial fibrillation: Secondary | ICD-10-CM | POA: Diagnosis not present

## 2023-04-21 DIAGNOSIS — I10 Essential (primary) hypertension: Secondary | ICD-10-CM | POA: Diagnosis not present

## 2023-04-21 LAB — GLUCOSE, CAPILLARY
Glucose-Capillary: 151 mg/dL — ABNORMAL HIGH (ref 70–99)
Glucose-Capillary: 156 mg/dL — ABNORMAL HIGH (ref 70–99)
Glucose-Capillary: 212 mg/dL — ABNORMAL HIGH (ref 70–99)
Glucose-Capillary: 130 mg/dL — ABNORMAL HIGH (ref 70–99)

## 2023-04-21 LAB — CULTURE, BLOOD (ROUTINE X 2): Special Requests: ADEQUATE

## 2023-04-21 NOTE — Progress Notes (Signed)
PHARMACY - PHYSICIAN COMMUNICATION CRITICAL VALUE ALERT - BLOOD CULTURE IDENTIFICATION (BCID)  Results for orders placed or performed during the hospital encounter of 04/18/23  SARS Coronavirus 2 by RT PCR (hospital order, performed in [facility] hospital lab) *cepheid single result test* Anterior Nasal Swab     Status: None   Collection Time: 04/18/23  7:34 PM   Specimen: Anterior Nasal Swab  Result Value Ref Range Status   SARS Coronavirus 2 by RT PCR NEGATIVE NEGATIVE Final    Comment: (NOTE) SARS-CoV-2 target nucleic acids are NOT DETECTED.  The SARS-CoV-2 RNA is generally detectable in upper and lower respiratory specimens during the acute phase of infection. The lowest concentration of SARS-CoV-2 viral copies this assay can detect is 250 copies / mL. A negative result does not preclude SARS-CoV-2 infection and should not be used as the sole basis for treatment or other patient management decisions.  A negative result may occur with improper specimen collection / handling, submission of specimen other than nasopharyngeal swab, presence of viral mutation(s) within the areas targeted by this assay, and inadequate number of viral copies (<250 copies / mL). A negative result must be combined with clinical observations, patient history, and epidemiological information.  Fact Sheet for Patients:   RoadLapTop.co.za  Fact Sheet for Healthcare Providers: http://kim-miller.com/  This test is not yet approved or  cleared by the [place] FDA and has been authorized for detection and/or diagnosis of SARS-CoV-2 by FDA under an Emergency Use Authorization (EUA).  This EUA will remain in effect (meaning this test can be used) for the duration of the COVID-19 declaration under Section 564(b)(1) of the Act, 21 U.S.C. section 360bbb-3(b)(1), unless the authorization is terminated or revoked sooner.  Performed at [facility], [address] [place], [place] [address]   Blood culture (routine x 2)     Status: None (Preliminary result)   Collection Time: 04/18/23  8:20 PM   Specimen: BLOOD  Result Value Ref Range Status   Specimen Description BLOOD RIGHT ANTECUBITAL  Final   Special Requests   Final    BOTTLES DRAWN AEROBIC AND ANAEROBIC Blood Culture adequate volume   Culture  Setup Time   Final    GRAM POSITIVE RODS AEROBIC BOTTLE ONLY CRITICAL RESULT CALLED TO, READ BACK BY AND VERIFIED WITH: Annalissa Murphey AT 6045 04/21/23.PMF Performed at [facility], [address] [place], [place] [address]    Culture GRAM POSITIVE RODS  Final   Report Status PENDING  Incomplete  Blood culture (routine x 2)     Status: None (Preliminary result)   Collection Time: 04/18/23  8:51 PM   Specimen: BLOOD  Result Value Ref Range Status   Specimen Description BLOOD BLOOD RIGHT HAND  Final   Special Requests   Final    BOTTLES DRAWN AEROBIC AND ANAEROBIC Blood Culture adequate volume   Culture   Final    NO GROWTH 2 DAYS Performed at [facility], [address], [place], [place] [address]    Report Status PENDING  Incomplete  Culture, Respiratory w Gram Stain     Status: None (Preliminary result)   Collection Time: 04/18/23 10:20 PM   Specimen: Tracheal Aspirate; Respiratory  Result Value Ref Range Status   Specimen Description   Final    TRACHEAL ASPIRATE Performed at [facility], [address], [place], [place] [address]    Special Requests   Final    Normal Performed at [facility], [phone] [place], [place] [address]    Gram Stain   Final    RARE WBC PRESENT, PREDOMINANTLY PMN FEW GRAM POSITIVE RODS FEW GRAM NEGATIVE RODS RARE GRAM POSITIVE COCCI    Culture   Final    ABUNDANT PSEUDOMONAS AERUGINOSA CULTURE REINCUBATED FOR BETTER GROWTH SUSCEPTIBILITIES TO FOLLOW Performed at [facility], [address], [place], [place] [address]    Report Status  PENDING  Incomplete    BCID Results: 1 (aerobic) of 4 bottles w/ GPR.  Name of provider contacted: N/A   Changes to prescribed antibiotics required: Will wait to notify provider on call pending additional lab cx results.  Otelia Sergeant, PharmD, [facility] 04/21/2023 6:13 AM

## 2023-04-21 NOTE — Progress Notes (Signed)
Progress Note   Patient: Deborah [name] DOB: 03-21-1956 DOA: 04/18/2023     3 DOS: the patient was seen and examined on 04/21/2023   Brief hospital course: Taken from H&P. Deborah [name] is a 67 y.o. African-American female with medical history significant for hypertension, chronic respiratory failure with trach collar in place, uses 3 L at night and during dialysis, dyslipidemia, diabetes mellitus, and end-stage renal disease on hemodialysis on Monday Wednesday and Fridays as well as atrial fibrillation, presented to the emergency room with acute onset of feeling sick and generalized weakness with excessive sleepiness nausea or vomiting and diarrhea since yesterday.  She admits to dyspnea with associated congested cough.   ED Course: BP 152/67 with pulse symmetry of 100% on 3 L of O2 by simple mask and otherwise normal vital signs.  Labs revealed hypokalemia and a BUN of 34 and creatinine calcium 8.6 with anion gap of 16.  BNP was 1460 troponin I 21.  CBC showed A1c of 10.9 with hemoglobin 9.6 hematocrit 27.9 above previous levels.  INR was 1.7 PT 20.6 with glucose of 101. EKG  showed normal sinus rhythm with a rate of 76, QT interval with QTc of 499 and Q waves laterally with T wave inversion. Imaging: Two-view chest x-ray showed the following: 1. Cardiomegaly with central pulmonary vascular congestion. 2. Increasing patchy opacities in both lung bases with new trace bilateral pleural effusions.   Chest CT without contrast revealed the following: Cardiomegaly. Coronary artery disease. There is ectasia of the main pulmonary artery suggesting possible pulmonary arterial hypertension. Small right pleural effusion. Minimal left pleural effusion. There are patchy infiltrates in mid and lower lung fields, more so in right lower lobe suggesting atelectasis/pneumonia.  Patient was started on Rocephin and Zithromax and admitted for concern of pneumonia.  5/16; vitals stable, on 5 L with  trach collar.  Leukocytosis and hypokalemia resolved.  Procalcitonin at 0.35, preliminary blood cultures negative in 12 hours and trach aspirate cultures pending.  Patient received an extra dialysis today.  5/17: Hemodynamically stable.  Labs stable.  Saturating well on 5 L of oxygen with trach collar.  Blood cultures remain negative, trach aspirate cultures with preliminary results of Pseudomonas aeruginosa, switching ceftriaxone with cefepime.  5/18: Remained hemodynamically stable.  Coughing improving.  Oxygen saturation at baseline.  Aspirate cultures growing Pseudomonas aeruginosa and corynebacterium diphtheria, good sensitivity.  Her blood cultures 1 out of 4 became positive with gram-positive rods after 3 days of negative cultures-still awaiting ID panel, likely a contaminant but want to make sure once I know the name before discharging.   Assessment and Plan: * CAP (community acquired pneumonia) X-ray concerning for pneumonia, procalcitonin at 0.35, patient has a chronic trach collar, trach collar respirate cultures with Pseudomonas aeruginosa, preliminary blood cultures , 1 out of 4 bottle, only aerobic, returning positive for gram-positive rods after staying negative for 3 days.  Still pending identification. Her ceftriaxone was switched with cefepime on 5/17 -Continue with cefepime - Continue with Zithromax. - Supportive care -Follow-up cultures  Fluid overload Patient did miss 1 dialysis on Monday.  Chest x-ray with concern of some pulmonary edema, went for an extra session today. -Continue to monitor  Acute respiratory failure with hypoxia ([name]) Patient initially required up to 5 L of oxygen with 3 L of baseline use, now back to baseline of 3 L through trach collar. -Continue to  Hypokalemia Replaced  Essential hypertension - We will continue her antihypertensives.  Dyslipidemia - We  will continue statin therapy.  Depression - We will continue Lexapro and  trazodone.  Type 2 diabetes mellitus with peripheral neuropathy ([name]) - The patient will be placed on supplemental coverage with NovoLog. - We will continue basal coverage. - We will continue Neurontin.  Paroxysmal atrial fibrillation ([name]) No acute concern. -Continue carvedilol and Eliquis   Subjective: Patient with improved cough and overall feeling better.  At baseline oxygen requirement.  We discussed about this late turning positive blood culture and still waiting for final identification.  She agrees to spend another night.  Physical Exam: Vitals:   04/21/23 0007 04/21/23 0750 04/21/23 0800 04/21/23 0907  BP: (!) 113/53   (!) 129/56  Pulse: 66   67  Resp: 18   16  Temp: 99.3 F (37.4 C)   98.1 F (36.7 C)  TempSrc:      SpO2: 94% 91%  99%  Weight:      Height:   5\' 2"  (1.575 m)    General.  Well-developed lady, in no acute distress.  Trach collar in place Pulmonary.  Lungs clear bilaterally, normal respiratory effort. CV.  Regular rate and rhythm, no JVD, rub or murmur. Abdomen.  Soft, nontender, nondistended, BS positive. CNS.  Alert and oriented .  No focal neurologic deficit. Extremities.  No edema, no cyanosis, pulses intact and symmetrical. Psychiatry.  Judgment and insight appears normal.   Data Reviewed: Prior data reviewed  Family Communication: Discussed with patient and son at bedside  Disposition: Status is: Inpatient Remains inpatient appropriate because: Severity of illness  Planned Discharge Destination: Home  DVT prophylaxis.  Eliquis Time spent: 44 minutes  This record has been created using Conservation officer, historic buildings. Errors have been sought and corrected,but may not always be located. Such creation errors do not reflect on the standard of care.   Author: Arnetha Courser, MD 04/21/2023 12:48 PM  For on call review [email or web address].

## 2023-04-21 NOTE — Progress Notes (Signed)
[facility]  ROUNDING NOTE   Subjective:   [name] was admitted to [facility] on 04/18/2023 for CAP (community acquired pneumonia) [J18.9] Community acquired pneumonia, unspecified laterality [J18.9]  Hemodialysis treatment yesterday. Tolerated treatment well.   Patient's son at bedside.   Patient states her cough has improved.   Pseudomonal in Sputum culture and gram positive rods in blood culture. Changed to cefepime and remains on azithromycin.   Objective:  Vital signs in last 24 hours:  Temp:  [98.1 F (36.7 C)-99.3 F (37.4 C)] 98.1 F (36.7 C) (05/18 0907) Pulse Rate:  [66-80] 67 (05/18 0907) Resp:  [15-27] 16 (05/18 0907) BP: (111-148)/(53-68) 129/56 (05/18 0907) SpO2:  [81 %-99 %] 99 % (05/18 0907) FiO2 (%):  [28 %] 28 % (05/17 2011) Weight:  [62.4 kg] 62.4 kg (05/17 1454)  Weight change: -2 kg Filed Weights   04/19/23 1437 04/20/23 1130 04/20/23 1454  Weight: 62.5 kg 62.4 kg 62.4 kg    Intake/Output: I/O last 3 completed shifts: In: 1015.1 [P.O.:480; IV Piggyback:535.1] Out: 0    Intake/Output this shift:  No intake/output data recorded.  Physical Exam: General: NAD, sitting up in bed  Head: Normocephalic, atraumatic. Moist oral mucosal membranes  Eyes: Anicteric, PERRL  Neck: +tracheostomy  Lungs:  rhonchi  Heart: Regular rate and rhythm  Abdomen:  Soft, nontender,   Extremities:  no peripheral edema.  Neurologic: Nonfocal, moving all four extremities  Skin: No lesions  Access: Left AVG    Basic Metabolic Panel: Recent Labs  Lab 04/18/23 1527 04/18/23 1958 04/19/23 0524 04/20/23 0456  NA 139  --  140 137  K 3.0*  --  3.7 4.4  CL 95*  --  97* 98  CO2 28  --  29 29  GLUCOSE 101*  --  152* 110*  BUN 34*  --  46* 33*  CREATININE 5.25*  --  7.59* 5.49*  CALCIUM 8.6*  --  8.0* 8.1*  MG  --  1.8  --   --      Liver Function Tests: No results for input(s): "AST", "ALT", "ALKPHOS", "BILITOT", "PROT", "ALBUMIN" in the last  168 hours. No results for input(s): "LIPASE", "AMYLASE" in the last 168 hours. No results for input(s): "AMMONIA" in the last 168 hours.  CBC: Recent Labs  Lab 04/18/23 1527 04/19/23 0524 04/20/23 0456  WBC 10.9* 7.6 7.4  HGB 9.6* 8.6* 8.3*  HCT 27.9* 25.3* 24.7*  MCV 88.0 89.4 90.8  PLT 187 156 162     Cardiac Enzymes: No results for input(s): "CKTOTAL", "CKMB", "CKMBINDEX", "TROPONINI" in the last 168 hours.  BNP: Invalid input(s): "POCBNP"  CBG: Recent Labs  Lab 04/19/23 2124 04/20/23 0734 04/20/23 1717 04/20/23 2119 04/21/23 0852  GLUCAP 197* 94 152* 191* 151*     Microbiology: Results for orders placed or performed during the hospital encounter of 04/18/23  SARS Coronavirus 2 by RT PCR (hospital order, performed in [facility] hospital lab) *cepheid single result test* Anterior Nasal Swab     Status: None   Collection Time: 04/18/23  7:34 PM   Specimen: Anterior Nasal Swab  Result Value Ref Range Status   SARS Coronavirus 2 by RT PCR NEGATIVE NEGATIVE Final    Comment: (NOTE) SARS-CoV-2 target nucleic acids are NOT DETECTED.  The SARS-CoV-2 RNA is generally detectable in upper and lower respiratory specimens during the acute phase of infection. The lowest concentration of SARS-CoV-2 viral copies this assay can detect is 250 copies / mL.  A negative result does not preclude SARS-CoV-2 infection and should not be used as the sole basis for treatment or other patient management decisions.  A negative result may occur with improper specimen collection / handling, submission of specimen other than nasopharyngeal swab, presence of viral mutation(s) within the areas targeted by this assay, and inadequate number of viral copies (<250 copies / mL). A negative result must be combined with clinical observations, patient history, and epidemiological information.  Fact Sheet for Patients:   RoadLapTop.co.za  Fact Sheet for Healthcare  Providers: http://kim-miller.com/  This test is not yet approved or  cleared by the [place] FDA and has been authorized for detection and/or diagnosis of SARS-CoV-2 by FDA under an Emergency Use Authorization (EUA).  This EUA will remain in effect (meaning this test can be used) for the duration of the COVID-19 declaration under Section 564(b)(1) of the Act, 21 U.S.C. section 360bbb-3(b)(1), unless the authorization is terminated or revoked sooner.  Performed at [facility], [address] [place], [place] [address]   Blood culture (routine x 2)     Status: None (Preliminary result)   Collection Time: 04/18/23  8:20 PM   Specimen: BLOOD  Result Value Ref Range Status   Specimen Description BLOOD RIGHT ANTECUBITAL  Final   Special Requests   Final    BOTTLES DRAWN AEROBIC AND ANAEROBIC Blood Culture adequate volume   Culture  Setup Time   Final    GRAM POSITIVE RODS AEROBIC BOTTLE ONLY CRITICAL RESULT CALLED TO, READ BACK BY AND VERIFIED WITH: NATHAN BELUE AT 1914 04/21/23.PMF Performed at [facility], [address] [place], [place] [address]    Culture GRAM POSITIVE RODS  Final   Report Status PENDING  Incomplete  Blood culture (routine x 2)     Status: None (Preliminary result)   Collection Time: 04/18/23  8:51 PM   Specimen: BLOOD  Result Value Ref Range Status   Specimen Description BLOOD BLOOD RIGHT HAND  Final   Special Requests   Final    BOTTLES DRAWN AEROBIC AND ANAEROBIC Blood Culture adequate volume   Culture   Final    NO GROWTH 3 DAYS Performed at [facility], [address], [place], [place] [address]    Report Status PENDING  Incomplete  Culture, Respiratory w Gram Stain     Status: None (Preliminary result)   Collection Time: 04/18/23 10:20 PM   Specimen: Tracheal Aspirate; Respiratory  Result Value Ref Range Status   Specimen Description   Final    TRACHEAL ASPIRATE Performed at [facility], [address], [place], [place] [address]    Special Requests   Final    Normal Performed at [facility], [address] [place], [place] [address]    Gram Stain   Final    RARE WBC PRESENT, PREDOMINANTLY PMN FEW GRAM POSITIVE RODS FEW GRAM NEGATIVE RODS RARE GRAM POSITIVE COCCI    Culture   Final    ABUNDANT PSEUDOMONAS AERUGINOSA CULTURE REINCUBATED FOR BETTER GROWTH SUSCEPTIBILITIES TO FOLLOW Performed at [facility], [address] [place], [place] [address]    Report Status PENDING  Incomplete    Coagulation Studies: Recent Labs    04/18/23 1527  LABPROT 20.6*  INR 1.7*     Urinalysis: No results for input(s): "COLORURINE", "LABSPEC", "PHURINE", "GLUCOSEU", "HGBUR", "BILIRUBINUR", "KETONESUR", "PROTEINUR", "UROBILINOGEN", "NITRITE", "LEUKOCYTESUR" in the last 72 hours.  Invalid input(s): "APPERANCEUR"    Imaging: No results found.  Medications:    azithromycin 500 mg (04/20/23 2136)   ceFEPime (MAXIPIME) IV 1 g (04/20/23 1734)    amLODipine  10 mg Oral Daily   apixaban  2.5 mg Oral BID   aspirin EC  81 mg Oral Daily   atorvastatin  80 mg Oral q1800   carvedilol  25 mg Oral BID WC   epoetin (EPOGEN/PROCRIT) injection  10,000 Units Intravenous Q M,W,F-HD   escitalopram  10 mg Oral Daily   furosemide  40 mg Intravenous Q12H   gabapentin  300 mg Oral QHS   guaiFENesin  600 mg Oral BID   insulin aspart  0-5 Units Subcutaneous QHS   insulin aspart  0-6 Units Subcutaneous TID WC   insulin detemir  4 Units Subcutaneous BID   ipratropium-albuterol  3 mL Nebulization BID   irbesartan  37.5 mg Oral Daily   midodrine  5 mg Oral Once per day on Mon Wed Fri   pantoprazole  40 mg Oral Daily   sevelamer carbonate  1,600 mg Oral TID WC   acetaminophen **OR** acetaminophen, chlorpheniramine-HYDROcodone, magnesium hydroxide, ondansetron **OR** ondansetron (ZOFRAN) IV, traZODone  Assessment/ Plan:  Ms. AZALEIA ARRELLANO is a 67 y.o.   female with end stage renal disease on hemodialysis, tracheostomy, hypertension, hyperlipidemia, diabetes mellitus type II insulin dependent, depression, and diabetic neuropathy who presents to [facility] for 04/18/2023 for CAP (community acquired pneumonia) [J18.9] Community acquired pneumonia, unspecified laterality [J18.9]  [facility] Nephrology MWF [address] Left AVG 62kg  End Stage Kidney Disease:  - Continue MWF schedule.   Hypertension with chronic kidney disease: current regimen of irbesartan, carvedilol and amlodipine. Home regimen of olmesartan instead of irbesartan.  - does require midodrine to prevent hypotensive episodes on hemodialysis.  Diabetes mellitus type II with chronic kidney disease: insulin dependent. Continue glucose control  Secondary Hyperparathyroidism - Continue sevelamer with meals.   Anemia of chronic kidney disease: hemoglobin 8.3 - EPO with HD treatments.   Pseudomonas pneumonia:  - Continue empiric antibiotics.    LOS: 3 Larrisha Babineau 5/18/202410:23 AM

## 2023-04-22 DIAGNOSIS — F32A Depression, unspecified: Secondary | ICD-10-CM

## 2023-04-22 DIAGNOSIS — J189 Pneumonia, unspecified organism: Secondary | ICD-10-CM | POA: Diagnosis not present

## 2023-04-22 DIAGNOSIS — E8770 Fluid overload, unspecified: Secondary | ICD-10-CM | POA: Diagnosis not present

## 2023-04-22 DIAGNOSIS — E876 Hypokalemia: Secondary | ICD-10-CM | POA: Diagnosis not present

## 2023-04-22 LAB — GLUCOSE, CAPILLARY
Glucose-Capillary: 107 mg/dL — ABNORMAL HIGH (ref 70–99)
Glucose-Capillary: 189 mg/dL — ABNORMAL HIGH (ref 70–99)

## 2023-04-22 MED ORDER — LEVOFLOXACIN 250 MG PO TABS: 250.0000 mg | ORAL_TABLET | Freq: Every day | ORAL | 0 refills | Status: DC

## 2023-04-22 MED ORDER — GUAIFENESIN-DM 100-10 MG/5ML PO SYRP: 5.0000 mL | ORAL_SOLUTION | ORAL | 0 refills | Status: AC | PRN

## 2023-04-22 NOTE — Progress Notes (Signed)
Discharge instructions given to both patient and her son. Son returned from home with oxygen concentrator for oxygen needs during [place] ride home.  Son stated that  the concentrator is not charged and will need to be charged for about one hour.   1830- Patient is packed up and ready to leave. Patient son stated that the concentrator is malfunctioning and does not think it is working well. Respiratory called to check on it.Handed over to oncoming nurse.

## 2023-04-22 NOTE — Plan of Care (Signed)
  Problem: Health Behavior/Discharge Planning: Goal: Ability to manage health-related needs will improve 04/22/2023 1144 by Nakea Gouger, Caren Griffins, RN Outcome: Adequate for Discharge 04/22/2023 1115 by Mariesa Grieder, Caren Griffins, RN Outcome: Adequate for Discharge

## 2023-04-22 NOTE — Discharge Summary (Addendum)
Physician Discharge Summary   Patient: Deborah [name] MRN: [id number] DOB: 1956-02-19  Admit date:     04/18/2023  Discharge date: 04/22/23  Discharge Physician: Arnetha Courser   PCP: Si Gaul, MD   Recommendations at discharge:  Please obtain CBC and BMP within a week Follow-up final blood culture results Please ensure the completion of antibiotics, tracheal secretions grew Pseudomonas. Continue with routine dialysis Follow-up with primary care provider  Discharge Diagnoses: Principal Problem:   CAP (community acquired pneumonia) Active Problems:   Fluid overload   Acute respiratory failure with hypoxia ([name])   Hypokalemia   Paroxysmal atrial fibrillation ([name])   Type 2 diabetes mellitus with peripheral neuropathy ([name])   Depression   Dyslipidemia   Essential hypertension   Hospital Course: Taken from H&P. Deborah [name] is a 67 y.o. African-American female with medical history significant for hypertension, chronic respiratory failure with trach collar in place, uses 3 L at night and during dialysis, dyslipidemia, diabetes mellitus, and end-stage renal disease on hemodialysis on Monday Wednesday and Fridays as well as atrial fibrillation, presented to the emergency room with acute onset of feeling sick and generalized weakness with excessive sleepiness nausea or vomiting and diarrhea since yesterday.  She admits to dyspnea with associated congested cough.   ED Course: BP 152/67 with pulse symmetry of 100% on 3 L of O2 by simple mask and otherwise normal vital signs.  Labs revealed hypokalemia and a BUN of 34 and creatinine calcium 8.6 with anion gap of 16.  BNP was 1460 troponin I 21.  CBC showed A1c of 10.9 with hemoglobin 9.6 hematocrit 27.9 above previous levels.  INR was 1.7 PT 20.6 with glucose of 101. EKG  showed normal sinus rhythm with a rate of 76, QT interval with QTc of 499 and Q waves laterally with T wave inversion. Imaging: Two-view chest x-ray showed the  following: 1. Cardiomegaly with central pulmonary vascular congestion. 2. Increasing patchy opacities in both lung bases with new trace bilateral pleural effusions.   Chest CT without contrast revealed the following: Cardiomegaly. Coronary artery disease. There is ectasia of the main pulmonary artery suggesting possible pulmonary arterial hypertension. Small right pleural effusion. Minimal left pleural effusion. There are patchy infiltrates in mid and lower lung fields, more so in right lower lobe suggesting atelectasis/pneumonia.  Patient was started on Rocephin and Zithromax and admitted for concern of pneumonia.  5/16; vitals stable, on 5 L with trach collar.  Leukocytosis and hypokalemia resolved.  Procalcitonin at 0.35, preliminary blood cultures negative in 12 hours and trach aspirate cultures pending.  Patient received an extra dialysis today.  5/17: Hemodynamically stable.  Labs stable.  Saturating well on 5 L of oxygen with trach collar.  Blood cultures remain negative, trach aspirate cultures with preliminary results of Pseudomonas aeruginosa, switching ceftriaxone with cefepime.  5/18: Remained hemodynamically stable.  Coughing improving.  Oxygen saturation at baseline.  Aspirate cultures growing Pseudomonas aeruginosa and corynebacterium diphtheria, good sensitivity.  Her blood cultures 1 out of 4 became positive with gram-positive rods after 3 days of negative cultures-still awaiting ID panel, likely a contaminant.  5/19: Patient remained stable and clinically improved.  Questionable 1/4 blood culture bottle still showing gram-positive rods.  Tried contacting microbiology and they were unable to provide me with any answers.  Most likely a contaminant.  Also discussed with ID on secure chat, she is being discharged on 5 days of Levaquin-dose based on her dialysis status.  She does not have any  fever or leukocytosis.  Most likely a contaminant.  Nephrology was also talking about  arranging outpatient cefepime during dialysis which is also a good alternative.  Patient is currently at baseline oxygen requirement of 3 L through trach collar.  She should be using oxygen all the time. Might require little more due to this recent pneumonia.  She was instructed to slowly wean back to her prior use.  She will continue the rest of her home medications and need to have a close follow-up with her providers for further recommendations.  Assessment and Plan: * CAP (community acquired pneumonia) X-ray concerning for pneumonia, procalcitonin at 0.35, patient has a chronic trach collar, trach collar respirate cultures with Pseudomonas aeruginosa, preliminary blood cultures , 1 out of 4 bottle, only aerobic, returning positive for gram-positive rods after staying negative for 3 days.  Still pending identification. Her ceftriaxone was switched with cefepime on 5/17 -Continue with cefepime - Continue with Zithromax. - Supportive care -Follow-up cultures  Fluid overload Patient did miss 1 dialysis on Monday.  Chest x-ray with concern of some pulmonary edema, went for an extra session today. -Continue to monitor  Acute respiratory failure with hypoxia ([name]) Patient initially required up to 5 L of oxygen with 3 L of baseline use, now back to baseline of 3 L through trach collar. -Continue to  Hypokalemia Replaced  Essential hypertension - We will continue her antihypertensives.  Dyslipidemia - We will continue statin therapy.  Depression - We will continue Lexapro and trazodone.  Type 2 diabetes mellitus with peripheral neuropathy ([name]) - The patient will be placed on supplemental coverage with NovoLog. - We will continue basal coverage. - We will continue Neurontin.  Paroxysmal atrial fibrillation ([name]) No acute concern. -Continue carvedilol and Eliquis   Consultants: Nephrology. Procedures performed: Hemodialysis Disposition: Home Diet recommendation:  Discharge  Diet Orders (From admission, onward)     Start     Ordered   04/22/23 0000  Diet - low sodium heart healthy        04/22/23 1056           Cardiac and Carb modified diet DISCHARGE MEDICATION: Allergies as of 04/22/2023   No Known Allergies      Medication List     STOP taking these medications    furosemide 40 MG tablet Commonly known as: LASIX   Lantus 100 UNIT/ML injection Generic drug: insulin glargine       TAKE these medications    acetaminophen 325 MG tablet Commonly known as: TYLENOL Take 650 mg by mouth every 6 (six) hours as needed for mild pain or fever.   albuterol (2.5 MG/3ML) 0.083% nebulizer solution Commonly known as: PROVENTIL Take 3 mLs (2.5 mg total) by nebulization 4 (four) times daily - after meals and at bedtime.   amLODipine 10 MG tablet Commonly known as: NORVASC Take 10 mg by mouth daily.   apixaban 2.5 MG Tabs tablet Commonly known as: ELIQUIS Take 2.5 mg by mouth 2 (two) times daily.   aspirin EC 81 MG tablet Take 1 tablet (81 mg total) by mouth daily.   atorvastatin 80 MG tablet Commonly known as: LIPITOR Take 80 mg by mouth daily at 6 PM.   carvedilol 25 MG tablet Commonly known as: COREG Take 25 mg by mouth 2 (two) times daily.   escitalopram 10 MG tablet Commonly known as: LEXAPRO Take 10 mg by mouth daily.   gabapentin 300 MG capsule Commonly known as: NEURONTIN Take 1 capsule (300 mg  total) by mouth at bedtime.   guaiFENesin-dextromethorphan 100-10 MG/5ML syrup Commonly known as: ROBITUSSIN DM Take 5 mLs by mouth every 4 (four) hours as needed for cough.   Levemir FlexTouch 100 UNIT/ML FlexPen Generic drug: insulin detemir Inject 4 Units into the skin 2 (two) times daily.   levofloxacin 250 MG tablet Commonly known as: Levaquin Take 1 tablet (250 mg total) by mouth daily for 5 days.   Lidocaine (Anorectal) 5 % Crea Sparingly Topical As Directed   lidocaine-prilocaine cream Commonly known as:  EMLA Apply 1 application topically every dialysis.   midodrine 5 MG tablet Commonly known as: PROAMATINE Take 5 mg by mouth 3 (three) times a week.   multivitamin Tabs tablet Take 1 tablet by mouth daily.   olmesartan 20 MG tablet Commonly known as: BENICAR Take 20 mg by mouth daily.   pantoprazole 40 MG tablet Commonly known as: PROTONIX Take 1 tablet (40 mg total) by mouth 2 (two) times daily.   sevelamer carbonate 800 MG tablet Commonly known as: RENVELA Take 1,600 mg by mouth 3 (three) times daily with meals.   traZODone 50 MG tablet Commonly known as: DESYREL Take 50 mg by mouth 3 (three) times a week. Take on Dialysis days               Durable Medical Equipment  (From admission, onward)           Start     Ordered   04/21/23 1233  For home use only DME oxygen  Once       Question Answer Comment  Length of Need Lifetime   Mode or (Route) Nasal cannula   Liters per Minute 2   Frequency Continuous (stationary and portable oxygen unit needed)   Oxygen conserving device Yes   Oxygen delivery system Gas      04/21/23 1232            Discharge Exam: Filed Weights   04/19/23 1437 04/20/23 1130 04/20/23 1454  Weight: 62.5 kg 62.4 kg 62.4 kg   General.  Well-developed lady, in no acute distress.  Trach collar in place Pulmonary.  Lungs clear bilaterally, normal respiratory effort. CV.  Regular rate and rhythm, no JVD, rub or murmur. Abdomen.  Soft, nontender, nondistended, BS positive. CNS.  Alert and oriented .  No focal neurologic deficit. Extremities.  No edema, no cyanosis, pulses intact and symmetrical. Psychiatry.  Judgment and insight appears normal.   Condition at discharge: stable  The results of significant diagnostics from this hospitalization (including imaging, microbiology, ancillary and laboratory) are listed below for reference.   Imaging Studies: CT Chest Wo Contrast  Result Date: 04/18/2023 CLINICAL DATA:  Shortness of  breath EXAM: CT CHEST WITHOUT CONTRAST TECHNIQUE: Multidetector CT imaging of the chest was performed following the standard protocol without IV contrast. RADIATION DOSE REDUCTION: This exam was performed according to the departmental dose-optimization program which includes automated exposure control, adjustment of the mA and/or kV according to patient size and/or use of iterative reconstruction technique. COMPARISON:  Previous studies including the CT done on 12-Nov-2020 and chest radiograph done earlier today FINDINGS: Cardiovascular: There is ectasia of the main pulmonary artery measuring 3.6 cm suggesting pulmonary arterial hypertension. Coronary artery calcifications are seen. 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 Heart is enlarged in size. Metallic sutures are seen in the sternum. Mediastinum/Nodes: There are slightly enlarged nodes in mediastinum. This finding has not changed significantly. Lungs/Pleura: Tip of tracheostomy tube is slightly above the level of aortic  arch. Small right pleural effusion is seen. Minimal left pleural effusion is seen. There are patchy infiltrates in both perihilar regions and both lower lung fields, more so in the right lower lobe suggesting atelectasis/pneumonia. There is no pneumothorax. Upper Abdomen: Small hiatal hernia is seen. There are small calcifications in liver and spleen, possibly granulomas. Musculoskeletal: Sclerotic density seen in the spinous process of T2 vertebra has not changed, possibly benign bone island. There is subcutaneous edema and skin thickening in both breasts without any loculated fluid collections. Degenerative changes are noted in the visualized lower cervical spine with encroachment of neural foramina. IMPRESSION: Cardiomegaly. Coronary artery disease. There is ectasia of the main pulmonary artery suggesting possible pulmonary arterial hypertension. Small right pleural effusion. Minimal left pleural effusion. There are patchy infiltrates in mid and  lower lung fields, more so in right lower lobe suggesting atelectasis/pneumonia. Small hiatal hernia. Other findings as described in the body of the report. Electronically Signed   By: Ernie Avena M.D.   On: 04/18/2023 20:23   DG Chest 2 View  Result Date: 04/18/2023 CLINICAL DATA:  Shortness of breath EXAM: CHEST - 2 VIEW COMPARISON:  Chest x-ray 01/04/2022 FINDINGS: Tracheostomy is unchanged in position. Patient is status post heart valve replacement. The heart is enlarged. There central pulmonary vascular congestion. There are increasing patchy opacities in both lung bases with new trace bilateral pleural effusions. No pneumothorax or acute fracture. Vascular stent/graft seen in the proximal left arm. IMPRESSION: 1. Cardiomegaly with central pulmonary vascular congestion. 2. Increasing patchy opacities in both lung bases with new trace bilateral pleural effusions. Electronically Signed   By: Darliss Cheney M.D.   On: 04/18/2023 16:04    Microbiology: Results for orders placed or performed during the hospital encounter of 04/18/23  SARS Coronavirus 2 by RT PCR (hospital order, performed in [facility] hospital lab) *cepheid single result test* Anterior Nasal Swab     Status: None   Collection Time: 04/18/23  7:34 PM   Specimen: Anterior Nasal Swab  Result Value Ref Range Status   SARS Coronavirus 2 by RT PCR NEGATIVE NEGATIVE Final    Comment: (NOTE) SARS-CoV-2 target nucleic acids are NOT DETECTED.  The SARS-CoV-2 RNA is generally detectable in upper and lower respiratory specimens during the acute phase of infection. The lowest concentration of SARS-CoV-2 viral copies this assay can detect is 250 copies / mL. A negative result does not preclude SARS-CoV-2 infection and should not be used as the sole basis for treatment or other patient management decisions.  A negative result may occur with improper specimen collection / handling, submission of specimen other than nasopharyngeal  swab, presence of viral mutation(s) within the areas targeted by this assay, and inadequate number of viral copies (<250 copies / mL). A negative result must be combined with clinical observations, patient history, and epidemiological information.  Fact Sheet for Patients:   RoadLapTop.co.za  Fact Sheet for Healthcare Providers: http://kim-miller.com/  This test is not yet approved or  cleared by the [place] FDA and has been authorized for detection and/or diagnosis of SARS-CoV-2 by FDA under an Emergency Use Authorization (EUA).  This EUA will remain in effect (meaning this test can be used) for the duration of the COVID-19 declaration under Section 564(b)(1) of the Act, 21 U.S.C. section 360bbb-3(b)(1), unless the authorization is terminated or revoked sooner.  Performed at [facility], [name] [place], [place] [address]   Blood culture (routine x 2)     Status:  None (Preliminary result)   Collection Time: 04/18/23  8:20 PM   Specimen: BLOOD  Result Value Ref Range Status   Specimen Description BLOOD RIGHT ANTECUBITAL  Final   Special Requests   Final    BOTTLES DRAWN AEROBIC AND ANAEROBIC Blood Culture adequate volume   Culture  Setup Time   Final    GRAM POSITIVE RODS AEROBIC BOTTLE ONLY CRITICAL RESULT CALLED TO, READ BACK BY AND VERIFIED WITH: NATHAN BELUE AT 2355 04/21/23.PMF Performed at [facility], [address] [place], [place] [address]    Culture GRAM POSITIVE RODS  Final   Report Status PENDING  Incomplete  Blood culture (routine x 2)     Status: None (Preliminary result)   Collection Time: 04/18/23  8:51 PM   Specimen: BLOOD  Result Value Ref Range Status   Specimen Description BLOOD BLOOD RIGHT HAND  Final   Special Requests   Final    BOTTLES DRAWN AEROBIC AND ANAEROBIC Blood Culture adequate volume   Culture   Final    NO GROWTH 4 DAYS Performed at [facility],  [address], [place], [place] [address]    Report Status PENDING  Incomplete  Culture, Respiratory w Gram Stain     Status: None (Preliminary result)   Collection Time: 04/18/23 10:20 PM   Specimen: Tracheal Aspirate; Respiratory  Result Value Ref Range Status   Specimen Description   Final    TRACHEAL ASPIRATE Performed at [facility], [address] [place], [place] [address]    Special Requests   Final    Normal Performed at [facility], [address] [place], [place] [address]    Gram Stain   Final    RARE WBC PRESENT, PREDOMINANTLY PMN FEW GRAM POSITIVE RODS FEW GRAM NEGATIVE RODS RARE GRAM POSITIVE COCCI    Culture   Final    ABUNDANT PSEUDOMONAS AERUGINOSA ABUNDANT DIPHTHEROIDS(CORYNEBACTERIUM SPECIES) Standardized susceptibility testing for this organism is not available. Performed at [facility], [address] [place], [place] [address]    Report Status PENDING  Incomplete   Organism ID, Bacteria PSEUDOMONAS AERUGINOSA  Final      Susceptibility   Pseudomonas aeruginosa - MIC*    CEFTAZIDIME 4 SENSITIVE Sensitive     CIPROFLOXACIN 0.5 SENSITIVE Sensitive     GENTAMICIN 2 SENSITIVE Sensitive     IMIPENEM 1 SENSITIVE Sensitive     PIP/TAZO 8 SENSITIVE Sensitive     CEFEPIME 2 SENSITIVE Sensitive     * ABUNDANT PSEUDOMONAS AERUGINOSA    Labs: CBC: Recent Labs  Lab 04/18/23 1527 04/19/23 0524 04/20/23 0456  WBC 10.9* 7.6 7.4  HGB 9.6* 8.6* 8.3*  HCT 27.9* 25.3* 24.7*  MCV 88.0 89.4 90.8  PLT 187 156 162   Basic Metabolic Panel: Recent Labs  Lab 04/18/23 1527 04/18/23 1958 04/19/23 0524 04/20/23 0456  NA 139  --  140 137  K 3.0*  --  3.7 4.4  CL 95*  --  97* 98  CO2 28  --  29 29  GLUCOSE 101*  --  152* 110*  BUN 34*  --  46* 33*  CREATININE 5.25*  --  7.59* 5.49*  CALCIUM 8.6*  --  8.0* 8.1*  MG  --  1.8  --   --    Liver Function Tests: No results for input(s): "AST", "ALT", "ALKPHOS", "BILITOT",  "PROT", "ALBUMIN" in the last 168 hours. CBG: Recent Labs  Lab 04/21/23 0852 04/21/23 1143 04/21/23  1717 04/21/23 2127 04/22/23 0827  GLUCAP 151* 156* 212* 130* 107*    Discharge time spent: greater than 30 minutes.  This record has been created using Conservation officer, historic buildings. Errors have been sought and corrected,but may not always be located. Such creation errors do not reflect on the standard of care.   Signed: Arnetha Courser, MD [facility] 04/22/2023

## 2023-04-22 NOTE — Plan of Care (Signed)
  Problem: Health Behavior/Discharge Planning: Goal: Ability to manage health-related needs will improve Outcome: Adequate for Discharge   

## 2023-04-22 NOTE — Progress Notes (Signed)
SATURATION QUALIFICATIONS: (This note is used to comply with regulatory documentation for home oxygen)  Patient Saturations on Room Air at Rest = 87%  Patient Saturations on Room Air while Ambulating = 85%  Patient Saturations on 3 Liters of oxygen while Ambulating = 97%

## 2023-04-22 NOTE — Plan of Care (Signed)
  Problem: Education: Goal: Knowledge of General Education information will improve Description: Including pain rating scale, medication(s)/side effects and non-pharmacologic comfort measures 04/22/2023 1150 by Gionni Vaca, Caren Griffins, RN Outcome: Adequate for Discharge 04/22/2023 1144 by Breylin Dom, Caren Griffins, RN Outcome: Adequate for Discharge 04/22/2023 1115 by Honest Safranek, Caren Griffins, RN Outcome: Adequate for Discharge

## 2023-04-22 NOTE — Progress Notes (Signed)
[facility]  ROUNDING NOTE   Subjective:   [name] was admitted to [facility] on 04/18/2023 for CAP (community acquired pneumonia) [J18.9] Community acquired pneumonia, unspecified laterality [J18.9]  Patient states her breathing is much better.   Objective:  Vital signs in last 24 hours:  Temp:  [98.6 F (37 C)] 98.6 F (37 C) (05/19 0019) Pulse Rate:  [66-75] 75 (05/19 1052) Resp:  [16-18] 16 (05/19 1052) BP: (109-142)/(51-63) 142/63 (05/19 1052) SpO2:  [92 %-97 %] 92 % (05/19 1052) FiO2 (%):  [28 %] 28 % (05/19 0824)  Weight change:  Filed Weights   04/19/23 1437 04/20/23 1130 04/20/23 1454  Weight: 62.5 kg 62.4 kg 62.4 kg    Intake/Output: I/O last 3 completed shifts: In: 672.8 [P.O.:240; IV Piggyback:432.8] Out: -    Intake/Output this shift:  No intake/output data recorded.  Physical Exam: General: NAD, sitting up in bed  Head: Normocephalic, atraumatic. Moist oral mucosal membranes  Eyes: Anicteric, PERRL  Neck: +tracheostomy  Lungs:  Rhonchi bilaterally   Heart: Regular rate and rhythm  Abdomen:  Soft, nontender,   Extremities:  no peripheral edema.  Neurologic: Nonfocal, moving all four extremities  Skin: No lesions  Access: Left AVG    Basic Metabolic Panel: Recent Labs  Lab 04/18/23 1527 04/18/23 1958 04/19/23 0524 04/20/23 0456  NA 139  --  140 137  K 3.0*  --  3.7 4.4  CL 95*  --  97* 98  CO2 28  --  29 29  GLUCOSE 101*  --  152* 110*  BUN 34*  --  46* 33*  CREATININE 5.25*  --  7.59* 5.49*  CALCIUM 8.6*  --  8.0* 8.1*  MG  --  1.8  --   --      Liver Function Tests: No results for input(s): "AST", "ALT", "ALKPHOS", "BILITOT", "PROT", "ALBUMIN" in the last 168 hours. No results for input(s): "LIPASE", "AMYLASE" in the last 168 hours. No results for input(s): "AMMONIA" in the last 168 hours.  CBC: Recent Labs  Lab 04/18/23 1527 04/19/23 0524 04/20/23 0456  WBC 10.9* 7.6 7.4  HGB 9.6* 8.6* 8.3*  HCT 27.9*  25.3* 24.7*  MCV 88.0 89.4 90.8  PLT 187 156 162     Cardiac Enzymes: No results for input(s): "CKTOTAL", "CKMB", "CKMBINDEX", "TROPONINI" in the last 168 hours.  BNP: Invalid input(s): "POCBNP"  CBG: Recent Labs  Lab 04/21/23 1143 04/21/23 1717 04/21/23 2127 04/22/23 0827 04/22/23 1134  GLUCAP 156* 212* 130* 107* 189*     Microbiology: Results for orders placed or performed during the hospital encounter of 04/18/23  SARS Coronavirus 2 by RT PCR (hospital order, performed in [facility] hospital lab) *cepheid single result test* Anterior Nasal Swab     Status: None   Collection Time: 04/18/23  7:34 PM   Specimen: Anterior Nasal Swab  Result Value Ref Range Status   SARS Coronavirus 2 by RT PCR NEGATIVE NEGATIVE Final    Comment: (NOTE) SARS-CoV-2 target nucleic acids are NOT DETECTED.  The SARS-CoV-2 RNA is generally detectable in upper and lower respiratory specimens during the acute phase of infection. The lowest concentration of SARS-CoV-2 viral copies this assay can detect is 250 copies / mL. A negative result does not preclude SARS-CoV-2 infection and should not be used as the sole basis for treatment or other patient management decisions.  A negative result may occur with improper specimen collection / handling, submission of specimen other than nasopharyngeal swab,  presence of viral mutation(s) within the areas targeted by this assay, and inadequate number of viral copies (<250 copies / mL). A negative result must be combined with clinical observations, patient history, and epidemiological information.  Fact Sheet for Patients:   RoadLapTop.co.za  Fact Sheet for Healthcare Providers: http://kim-miller.com/  This test is not yet approved or  cleared by the [place] FDA and has been authorized for detection and/or diagnosis of SARS-CoV-2 by FDA under an Emergency Use Authorization (EUA).  This EUA will  remain in effect (meaning this test can be used) for the duration of the COVID-19 declaration under Section 564(b)(1) of the Act, 21 U.S.C. section 360bbb-3(b)(1), unless the authorization is terminated or revoked sooner.  Performed at [facility], [address] [place], [place] [address]   Blood culture (routine x 2)     Status: None (Preliminary result)   Collection Time: 04/18/23  8:20 PM   Specimen: BLOOD  Result Value Ref Range Status   Specimen Description BLOOD RIGHT ANTECUBITAL  Final   Special Requests   Final    BOTTLES DRAWN AEROBIC AND ANAEROBIC Blood Culture adequate volume   Culture  Setup Time   Final    GRAM POSITIVE RODS AEROBIC BOTTLE ONLY CRITICAL RESULT CALLED TO, READ BACK BY AND VERIFIED WITH: NATHAN BELUE AT 6045 04/21/23.PMF Performed at [facility], [address] [place], [place] [address]    Culture GRAM POSITIVE RODS  Final   Report Status PENDING  Incomplete  Blood culture (routine x 2)     Status: None (Preliminary result)   Collection Time: 04/18/23  8:51 PM   Specimen: BLOOD  Result Value Ref Range Status   Specimen Description BLOOD BLOOD RIGHT HAND  Final   Special Requests   Final    BOTTLES DRAWN AEROBIC AND ANAEROBIC Blood Culture adequate volume   Culture   Final    NO GROWTH 4 DAYS Performed at [facility], [address], [place], [place] [address]    Report Status PENDING  Incomplete  Culture, Respiratory w Gram Stain     Status: None (Preliminary result)   Collection Time: 04/18/23 10:20 PM   Specimen: Tracheal Aspirate; Respiratory  Result Value Ref Range Status   Specimen Description   Final    TRACHEAL ASPIRATE Performed at [facility], [address] [place], [place] [address]    Special Requests   Final    Normal Performed at [facility], [address] [place], [place] [address]    Gram Stain   Final    RARE WBC PRESENT, PREDOMINANTLY PMN FEW GRAM POSITIVE  RODS FEW GRAM NEGATIVE RODS RARE GRAM POSITIVE COCCI    Culture   Final    ABUNDANT PSEUDOMONAS AERUGINOSA ABUNDANT DIPHTHEROIDS(CORYNEBACTERIUM SPECIES) Standardized susceptibility testing for this organism is not available. Performed at [facility], [address] [place], [place] [address]    Report Status PENDING  Incomplete   Organism ID, Bacteria PSEUDOMONAS AERUGINOSA  Final      Susceptibility   Pseudomonas aeruginosa - MIC*    CEFTAZIDIME 4 SENSITIVE Sensitive     CIPROFLOXACIN 0.5 SENSITIVE Sensitive     GENTAMICIN 2 SENSITIVE Sensitive     IMIPENEM 1 SENSITIVE Sensitive     PIP/TAZO 8 SENSITIVE Sensitive     CEFEPIME 2 SENSITIVE Sensitive     * ABUNDANT PSEUDOMONAS AERUGINOSA    Coagulation Studies: No results for input(s): "LABPROT", "INR" in the last 72 hours.  Urinalysis: No results for input(s): "COLORURINE", "LABSPEC", "PHURINE", "GLUCOSEU", "HGBUR", "BILIRUBINUR", "KETONESUR", "PROTEINUR", "UROBILINOGEN", "NITRITE", "LEUKOCYTESUR" in the last 72 hours.  Invalid input(s): "APPERANCEUR"    Imaging: No results found.   Medications:    azithromycin 500 mg (04/21/23 2204)   ceFEPime (MAXIPIME) IV 1 g (04/21/23 1644)    amLODipine  10 mg Oral Daily   apixaban  2.5 mg Oral BID   aspirin EC  81 mg Oral Daily   atorvastatin  80 mg Oral q1800   carvedilol  25 mg Oral BID WC   epoetin (EPOGEN/PROCRIT) injection  10,000 Units Intravenous Q M,W,F-HD   escitalopram  10 mg Oral Daily   furosemide  40 mg Intravenous Q12H   gabapentin  300 mg Oral QHS   guaiFENesin  600 mg Oral BID   insulin aspart  0-5 Units Subcutaneous QHS   insulin aspart  0-6 Units Subcutaneous TID WC   insulin detemir  4 Units Subcutaneous BID   ipratropium-albuterol  3 mL Nebulization BID   irbesartan  37.5 mg Oral Daily   midodrine  5 mg Oral Once per day on Mon Wed Fri   pantoprazole  40 mg Oral Daily   sevelamer carbonate  1,600 mg Oral TID WC   acetaminophen **OR**  acetaminophen, chlorpheniramine-HYDROcodone, magnesium hydroxide, ondansetron **OR** ondansetron (ZOFRAN) IV, traZODone  Assessment/ Plan:  [name] is a 67 y.o.  female with end stage renal disease on hemodialysis, tracheostomy, hypertension, hyperlipidemia, diabetes mellitus type II insulin dependent, depression, and diabetic neuropathy who presents to [facility] for 04/18/2023 for CAP (community acquired pneumonia) [J18.9] Community acquired pneumonia, unspecified laterality [J18.9]  [facility] MWF [address] Left AVG 62kg  End Stage Kidney Disease:  - Continue MWF schedule.   Hypertension with chronic kidney disease: current regimen of irbesartan, carvedilol and amlodipine. Home regimen of olmesartan instead of irbesartan.  - does require midodrine to prevent hypotensive episodes on hemodialysis.  Diabetes mellitus type II with chronic kidney disease: insulin dependent.  - Continue glucose control  Secondary Hyperparathyroidism - Continue sevelamer with meals.   Anemia of chronic kidney disease: hemoglobin 8.3 - EPO with HD treatments.   Pseudomonas pneumonia:  - Cefepime 1gram for a total of 2 weeks.    LOS: 4 Alanis Clift 5/19/202412:25 PM

## 2023-04-22 NOTE — Progress Notes (Addendum)
Spoke with patient and son and explained that her portable O2 is initiated by breath. Placed pt portable O2 on with her ATC and pt SPO2 increased as patient initiated breath. Pt and son expressed understanding, information also relayed to pt RN.

## 2023-04-22 NOTE — TOC Transition Note (Signed)
Transition of Care [facility]) - CM/SW Discharge Note   Patient Details  Name: Deborah [name] MRN: [id number] Date of Birth: 11/14/1956  Transition [facility] Care [facility] CM/SW Contact:  Bing Quarry, RN Phone Number: 04/22/2023, 3:58 PM   Clinical Narrative:  04/22/23: Patient to be discharged to home with family today. DME oxygen ordered. Baseline chronic 3L/[place] at home. Oxygen saturation done for any resumption of care needed for oxygen supplier [employer]. Customer service line is [phone]. Spoke with general call center regarding transport tanks and rep told RN CM that entire condenser can be brought to hospital for transport but when RN CM spoke with son, he indicated there were transport tanks he would try to fill and would arrange ride home via [employer] as he does not drive and uses [employer] frequently. Encourage him to contact [employer] Monday for any updates or servicing needs for patient's situation. Updated provider/Unit RN. Gabriel Cirri RN CM      Final next level of care: Home/Self Care (with family support) Barriers to Discharge: Barriers Resolved   Patient Goals and CMS Choice      Discharge Placement                         Discharge Plan and Services Additional resources added to the After Visit Summary for                  DME Arranged: Oxygen DME Agency:  [name] Ramus [phone]) Date DME Agency Contacted: 04/22/23     HH Arranged: NA          Social Determinants of Health (SDOH) Interventions SDOH Screenings   Food Insecurity: No Food Insecurity (04/18/2023)  Housing: Patient Declined (04/18/2023)  Transportation Needs: No Transportation Needs (04/18/2023)  Utilities: Not At Risk (04/18/2023)  Tobacco Use: Low Risk  (04/18/2023)     Readmission Risk Interventions    11/04/2020    9:20 AM  Readmission Risk Prevention Plan  Transportation Screening Complete  PCP or Specialist appointment within 3-5 days of discharge Complete  SW Recovery Care/Counseling Consult  Complete  Palliative Care Screening Not Applicable  Skilled Nursing Facility Not Applicable

## 2023-04-22 NOTE — Progress Notes (Signed)
Pt left the hospital with company of son. Alert and oriented with no signs and symptoms of distress. Pt and son stated that they received and understood discharge instructions given by day shift nurse.

## 2023-04-23 ENCOUNTER — Emergency Department: Payer: 59

## 2023-04-23 ENCOUNTER — Encounter: Payer: Self-pay | Admitting: Internal Medicine

## 2023-04-23 ENCOUNTER — Other Ambulatory Visit: Payer: Self-pay

## 2023-04-23 ENCOUNTER — Inpatient Hospital Stay
Admission: EM | Admit: 2023-04-23 | Discharge: 2023-05-09 | DRG: 189 | Disposition: A | Discharge status: Home Health | Payer: 59 | Attending: Internal Medicine | Admitting: Internal Medicine

## 2023-04-23 DIAGNOSIS — J189 Pneumonia, unspecified organism: Secondary | ICD-10-CM | POA: Diagnosis present

## 2023-04-23 DIAGNOSIS — E785 Hyperlipidemia, unspecified: Secondary | ICD-10-CM | POA: Diagnosis present

## 2023-04-23 DIAGNOSIS — L299 Pruritus, unspecified: Secondary | ICD-10-CM | POA: Diagnosis present

## 2023-04-23 DIAGNOSIS — J9621 Acute and chronic respiratory failure with hypoxia: Secondary | ICD-10-CM | POA: Diagnosis not present

## 2023-04-23 DIAGNOSIS — Z952 Presence of prosthetic heart valve: Secondary | ICD-10-CM

## 2023-04-23 DIAGNOSIS — I5032 Chronic diastolic (congestive) heart failure: Secondary | ICD-10-CM | POA: Diagnosis present

## 2023-04-23 DIAGNOSIS — E1122 Type 2 diabetes mellitus with diabetic chronic kidney disease: Secondary | ICD-10-CM | POA: Diagnosis present

## 2023-04-23 DIAGNOSIS — Z794 Long term (current) use of insulin: Secondary | ICD-10-CM

## 2023-04-23 DIAGNOSIS — R590 Localized enlarged lymph nodes: Secondary | ICD-10-CM | POA: Diagnosis present

## 2023-04-23 DIAGNOSIS — A09 Infectious gastroenteritis and colitis, unspecified: Secondary | ICD-10-CM | POA: Diagnosis not present

## 2023-04-23 DIAGNOSIS — Z930 Tracheostomy status: Secondary | ICD-10-CM

## 2023-04-23 DIAGNOSIS — Z8249 Family history of ischemic heart disease and other diseases of the circulatory system: Secondary | ICD-10-CM

## 2023-04-23 DIAGNOSIS — Z955 Presence of coronary angioplasty implant and graft: Secondary | ICD-10-CM

## 2023-04-23 DIAGNOSIS — N898 Other specified noninflammatory disorders of vagina: Secondary | ICD-10-CM | POA: Diagnosis present

## 2023-04-23 DIAGNOSIS — I482 Chronic atrial fibrillation, unspecified: Secondary | ICD-10-CM | POA: Diagnosis present

## 2023-04-23 DIAGNOSIS — A048 Other specified bacterial intestinal infections: Secondary | ICD-10-CM

## 2023-04-23 DIAGNOSIS — I480 Paroxysmal atrial fibrillation: Secondary | ICD-10-CM | POA: Diagnosis present

## 2023-04-23 DIAGNOSIS — Z7982 Long term (current) use of aspirin: Secondary | ICD-10-CM

## 2023-04-23 DIAGNOSIS — Z7901 Long term (current) use of anticoagulants: Secondary | ICD-10-CM | POA: Diagnosis not present

## 2023-04-23 DIAGNOSIS — E1140 Type 2 diabetes mellitus with diabetic neuropathy, unspecified: Secondary | ICD-10-CM | POA: Diagnosis present

## 2023-04-23 DIAGNOSIS — N2581 Secondary hyperparathyroidism of renal origin: Secondary | ICD-10-CM | POA: Diagnosis present

## 2023-04-23 DIAGNOSIS — I2510 Atherosclerotic heart disease of native coronary artery without angina pectoris: Secondary | ICD-10-CM | POA: Diagnosis present

## 2023-04-23 DIAGNOSIS — N186 End stage renal disease: Secondary | ICD-10-CM | POA: Diagnosis present

## 2023-04-23 DIAGNOSIS — I2721 Secondary pulmonary arterial hypertension: Secondary | ICD-10-CM | POA: Diagnosis present

## 2023-04-23 DIAGNOSIS — Z1152 Encounter for screening for COVID-19: Secondary | ICD-10-CM

## 2023-04-23 DIAGNOSIS — B965 Pseudomonas (aeruginosa) (mallei) (pseudomallei) as the cause of diseases classified elsewhere: Secondary | ICD-10-CM

## 2023-04-23 DIAGNOSIS — D631 Anemia in chronic kidney disease: Secondary | ICD-10-CM | POA: Diagnosis present

## 2023-04-23 DIAGNOSIS — Z992 Dependence on renal dialysis: Secondary | ICD-10-CM

## 2023-04-23 DIAGNOSIS — I132 Hypertensive heart and chronic kidney disease with heart failure and with stage 5 chronic kidney disease, or end stage renal disease: Secondary | ICD-10-CM | POA: Diagnosis present

## 2023-04-23 DIAGNOSIS — Z833 Family history of diabetes mellitus: Secondary | ICD-10-CM

## 2023-04-23 DIAGNOSIS — E663 Overweight: Secondary | ICD-10-CM | POA: Insufficient documentation

## 2023-04-23 DIAGNOSIS — Z79899 Other long term (current) drug therapy: Secondary | ICD-10-CM

## 2023-04-23 DIAGNOSIS — Z8619 Personal history of other infectious and parasitic diseases: Secondary | ICD-10-CM

## 2023-04-23 DIAGNOSIS — I5033 Acute on chronic diastolic (congestive) heart failure: Secondary | ICD-10-CM | POA: Diagnosis present

## 2023-04-23 DIAGNOSIS — Z9981 Dependence on supplemental oxygen: Secondary | ICD-10-CM

## 2023-04-23 DIAGNOSIS — I10 Essential (primary) hypertension: Secondary | ICD-10-CM | POA: Diagnosis present

## 2023-04-23 DIAGNOSIS — J9811 Atelectasis: Secondary | ICD-10-CM | POA: Diagnosis present

## 2023-04-23 DIAGNOSIS — J151 Pneumonia due to Pseudomonas: Secondary | ICD-10-CM | POA: Diagnosis present

## 2023-04-23 DIAGNOSIS — I252 Old myocardial infarction: Secondary | ICD-10-CM

## 2023-04-23 HISTORY — DX: Long term (current) use of insulin: Z79.4

## 2023-04-23 HISTORY — DX: Type 2 diabetes mellitus with unspecified complications: E11.8

## 2023-04-23 HISTORY — DX: Acute myocardial infarction, unspecified: I21.9

## 2023-04-23 HISTORY — DX: ST elevation (STEMI) myocardial infarction involving left main coronary artery: I21.01

## 2023-04-23 HISTORY — DX: Unspecified atrial flutter: I48.92

## 2023-04-23 HISTORY — DX: Non-ST elevation (NSTEMI) myocardial infarction: I21.4

## 2023-04-23 LAB — CBC WITH DIFFERENTIAL/PLATELET
Abs Immature Granulocytes: 0.06 10*3/uL (ref 0.00–0.07)
Basophils Absolute: 0.1 10*3/uL (ref 0.0–0.1)
Basophils Relative: 1 %
Eosinophils Absolute: 0.6 10*3/uL — ABNORMAL HIGH (ref 0.0–0.5)
Eosinophils Relative: 5 %
HCT: 25.3 % — ABNORMAL LOW (ref 36.0–46.0)
Hemoglobin: 8.4 g/dL — ABNORMAL LOW (ref 12.0–15.0)
Immature Granulocytes: 1 %
Lymphocytes Relative: 5 %
Lymphs Abs: 0.6 10*3/uL — ABNORMAL LOW (ref 0.7–4.0)
MCH: 30.7 pg (ref 26.0–34.0)
MCHC: 33.2 g/dL (ref 30.0–36.0)
MCV: 92.3 fL (ref 80.0–100.0)
Monocytes Absolute: 1.1 10*3/uL — ABNORMAL HIGH (ref 0.1–1.0)
Monocytes Relative: 9 %
Neutro Abs: 9.8 10*3/uL — ABNORMAL HIGH (ref 1.7–7.7)
Neutrophils Relative %: 79 %
Platelets: 179 10*3/uL (ref 150–400)
RBC: 2.74 MIL/uL — ABNORMAL LOW (ref 3.87–5.11)
RDW: 15.3 % (ref 11.5–15.5)
WBC: 12.2 10*3/uL — ABNORMAL HIGH (ref 4.0–10.5)
nRBC: 0 % (ref 0.0–0.2)

## 2023-04-23 LAB — CULTURE, RESPIRATORY W GRAM STAIN: Special Requests: NORMAL

## 2023-04-23 LAB — TROPONIN I (HIGH SENSITIVITY)
Troponin I (High Sensitivity): 14 ng/L (ref <18)
Troponin I (High Sensitivity): 12 ng/L (ref <18)

## 2023-04-23 LAB — SARS CORONAVIRUS 2 BY RT PCR: SARS Coronavirus 2 by RT PCR: NEGATIVE

## 2023-04-23 LAB — CULTURE, BLOOD (ROUTINE X 2)

## 2023-04-23 LAB — BASIC METABOLIC PANEL
Anion gap: 13 (ref 5–15)
BUN: 57 mg/dL — ABNORMAL HIGH (ref 8–23)
CO2: 24 mmol/L (ref 22–32)
Calcium: 8.8 mg/dL — ABNORMAL LOW (ref 8.9–10.3)
Chloride: 102 mmol/L (ref 98–111)
Creatinine, Ser: 9.19 mg/dL — ABNORMAL HIGH (ref 0.44–1.00)
GFR, Estimated: 4 mL/min — ABNORMAL LOW (ref >60)
Glucose, Bld: 135 mg/dL — ABNORMAL HIGH (ref 70–99)
Potassium: 4.4 mmol/L (ref 3.5–5.1)
Sodium: 139 mmol/L (ref 135–145)

## 2023-04-23 LAB — CBG MONITORING, ED: Glucose-Capillary: 153 mg/dL — ABNORMAL HIGH (ref 70–99)

## 2023-04-23 LAB — HEPATITIS B SURFACE ANTIGEN: Hepatitis B Surface Ag: NONREACTIVE

## 2023-04-23 MED ORDER — ONDANSETRON HCL 4 MG PO TABS
4.0000 mg | ORAL_TABLET | Freq: Four times a day (QID) | ORAL | Status: DC | PRN
  Administered 2023-04-23: 4 mg via ORAL
  Filled 2023-04-23: qty 1

## 2023-04-23 MED ORDER — GUAIFENESIN-DM 100-10 MG/5ML PO SYRP
15.0000 mL | ORAL_SOLUTION | ORAL | Status: DC | PRN
  Administered 2023-04-24 – 2023-05-01 (×3): 15 mL via ORAL
  Filled 2023-04-23 (×3): qty 20

## 2023-04-23 MED ORDER — GABAPENTIN 300 MG PO CAPS
300.0000 mg | ORAL_CAPSULE | Freq: Every day | ORAL | Status: DC
  Administered 2023-04-23 – 2023-05-08 (×16): 300 mg via ORAL
  Filled 2023-04-23 (×16): qty 1

## 2023-04-23 MED ORDER — PENTAFLUOROPROP-TETRAFLUOROETH EX AERO: 1.0000 | INHALATION_SPRAY | CUTANEOUS | Status: DC | PRN

## 2023-04-23 MED ORDER — ACETAMINOPHEN 325 MG PO TABS
650.0000 mg | ORAL_TABLET | Freq: Four times a day (QID) | ORAL | Status: DC | PRN
  Administered 2023-04-25 – 2023-05-03 (×2): 650 mg via ORAL
  Filled 2023-04-23 (×3): qty 2

## 2023-04-23 MED ORDER — LIDOCAINE HCL (PF) 1 % IJ SOLN: 5.0000 mL | INTRAMUSCULAR | Status: DC | PRN

## 2023-04-23 MED ORDER — INSULIN ASPART 100 UNIT/ML IJ SOLN
0.0000 [IU] | Freq: Every day | INTRAMUSCULAR | Status: DC
  Administered 2023-04-28: 4 [IU] via SUBCUTANEOUS
  Administered 2023-05-01 – 2023-05-02 (×2): 2 [IU] via SUBCUTANEOUS
  Administered 2023-05-03: 1 [IU] via SUBCUTANEOUS
  Administered 2023-05-04: 3 [IU] via SUBCUTANEOUS
  Administered 2023-05-05: 2 [IU] via SUBCUTANEOUS
  Filled 2023-04-23 (×6): qty 1

## 2023-04-23 MED ORDER — ACETAMINOPHEN 650 MG RE SUPP: 650.0000 mg | Freq: Four times a day (QID) | RECTAL | Status: DC | PRN

## 2023-04-23 MED ORDER — INSULIN ASPART 100 UNIT/ML IJ SOLN
0.0000 [IU] | Freq: Three times a day (TID) | INTRAMUSCULAR | Status: DC
  Administered 2023-04-24 – 2023-04-26 (×3): 1 [IU] via SUBCUTANEOUS
  Administered 2023-04-26: 2 [IU] via SUBCUTANEOUS
  Administered 2023-04-27: 1 [IU] via SUBCUTANEOUS
  Administered 2023-04-28: 2 [IU] via SUBCUTANEOUS
  Administered 2023-04-28: 1 [IU] via SUBCUTANEOUS
  Administered 2023-04-29: 2 [IU] via SUBCUTANEOUS
  Administered 2023-04-29: 3 [IU] via SUBCUTANEOUS
  Administered 2023-04-29: 1 [IU] via SUBCUTANEOUS
  Administered 2023-05-01: 2 [IU] via SUBCUTANEOUS
  Administered 2023-05-01: 1 [IU] via SUBCUTANEOUS
  Administered 2023-05-03 (×2): 2 [IU] via SUBCUTANEOUS
  Administered 2023-05-04 – 2023-05-08 (×7): 1 [IU] via SUBCUTANEOUS
  Administered 2023-05-08: 2 [IU] via SUBCUTANEOUS
  Filled 2023-04-23 (×21): qty 1

## 2023-04-23 MED ORDER — ALTEPLASE 2 MG IJ SOLR: 2.0000 mg | Freq: Once | INTRAMUSCULAR | Status: DC | PRN

## 2023-04-23 MED ORDER — HEPARIN SODIUM (PORCINE) 1000 UNIT/ML DIALYSIS: 1000.0000 [IU] | INTRAMUSCULAR | Status: DC | PRN

## 2023-04-23 MED ORDER — AMLODIPINE BESYLATE 10 MG PO TABS
10.0000 mg | ORAL_TABLET | Freq: Every day | ORAL | Status: DC
  Administered 2023-04-24 – 2023-05-03 (×8): 10 mg via ORAL
  Filled 2023-04-23 (×4): qty 1
  Filled 2023-04-23: qty 2
  Filled 2023-04-23 (×2): qty 1

## 2023-04-23 MED ORDER — ESCITALOPRAM OXALATE 10 MG PO TABS
10.0000 mg | ORAL_TABLET | Freq: Every day | ORAL | Status: DC
  Administered 2023-04-24 – 2023-05-09 (×16): 10 mg via ORAL
  Filled 2023-04-23 (×15): qty 1

## 2023-04-23 MED ORDER — SODIUM CHLORIDE 0.9 % IV SOLN
1.0000 g | INTRAVENOUS | Status: AC
  Administered 2023-04-23 – 2023-04-29 (×7): 1 g via INTRAVENOUS
  Filled 2023-04-23 (×7): qty 10

## 2023-04-23 MED ORDER — ATORVASTATIN CALCIUM 80 MG PO TABS
80.0000 mg | ORAL_TABLET | Freq: Every day | ORAL | Status: DC
  Administered 2023-04-24 – 2023-05-08 (×14): 80 mg via ORAL
  Filled 2023-04-23 (×8): qty 1
  Filled 2023-04-23: qty 4
  Filled 2023-04-23 (×2): qty 1
  Filled 2023-04-23: qty 4

## 2023-04-23 MED ORDER — ANTICOAGULANT SODIUM CITRATE 4% (200MG/5ML) IV SOLN
5.0000 mL | Status: DC | PRN
  Filled 2023-04-23: qty 5

## 2023-04-23 MED ORDER — TRAZODONE HCL 50 MG PO TABS
50.0000 mg | ORAL_TABLET | ORAL | Status: DC
  Administered 2023-04-27 – 2023-05-07 (×5): 50 mg via ORAL
  Filled 2023-04-23 (×6): qty 1

## 2023-04-23 MED ORDER — APIXABAN 2.5 MG PO TABS
2.5000 mg | ORAL_TABLET | Freq: Two times a day (BID) | ORAL | Status: DC
  Administered 2023-04-23 – 2023-05-09 (×30): 2.5 mg via ORAL
  Filled 2023-04-23 (×30): qty 1

## 2023-04-23 MED ORDER — IPRATROPIUM-ALBUTEROL 0.5-2.5 (3) MG/3ML IN SOLN
3.0000 mL | Freq: Four times a day (QID) | RESPIRATORY_TRACT | Status: DC
  Administered 2023-04-23 – 2023-05-01 (×29): 3 mL via RESPIRATORY_TRACT
  Filled 2023-04-23 (×2): qty 3
  Filled 2023-04-23: qty 6
  Filled 2023-04-23 (×9): qty 3
  Filled 2023-04-23: qty 39
  Filled 2023-04-23 (×14): qty 3

## 2023-04-23 MED ORDER — CARVEDILOL 25 MG PO TABS
25.0000 mg | ORAL_TABLET | Freq: Two times a day (BID) | ORAL | Status: DC
  Administered 2023-04-23 – 2023-05-01 (×15): 25 mg via ORAL
  Filled 2023-04-23 (×14): qty 1

## 2023-04-23 MED ORDER — INSULIN DETEMIR 100 UNIT/ML ~~LOC~~ SOLN
4.0000 [IU] | Freq: Two times a day (BID) | SUBCUTANEOUS | Status: DC
  Administered 2023-04-23 – 2023-05-08 (×28): 4 [IU] via SUBCUTANEOUS
  Filled 2023-04-23 (×33): qty 0.04

## 2023-04-23 MED ORDER — SEVELAMER CARBONATE 800 MG PO TABS
1600.0000 mg | ORAL_TABLET | Freq: Three times a day (TID) | ORAL | Status: DC
  Administered 2023-04-24 – 2023-05-08 (×35): 1600 mg via ORAL
  Filled 2023-04-23 (×35): qty 2

## 2023-04-23 MED ORDER — ONDANSETRON HCL 4 MG/2ML IJ SOLN
4.0000 mg | Freq: Four times a day (QID) | INTRAMUSCULAR | Status: DC | PRN
  Administered 2023-04-27: 4 mg via INTRAVENOUS
  Filled 2023-04-23: qty 2

## 2023-04-23 MED ORDER — LIDOCAINE-PRILOCAINE 2.5-2.5 % EX CREA: 1.0000 | TOPICAL_CREAM | CUTANEOUS | Status: DC | PRN

## 2023-04-23 MED ORDER — ASPIRIN 81 MG PO TBEC
81.0000 mg | DELAYED_RELEASE_TABLET | Freq: Every day | ORAL | Status: DC
  Administered 2023-04-24 – 2023-05-09 (×16): 81 mg via ORAL
  Filled 2023-04-23 (×14): qty 1

## 2023-04-23 MED ORDER — CHLORHEXIDINE GLUCONATE CLOTH 2 % EX PADS
6.0000 | MEDICATED_PAD | Freq: Every day | CUTANEOUS | Status: DC
  Administered 2023-04-27 – 2023-05-09 (×12): 6 via TOPICAL
  Filled 2023-04-23: qty 6

## 2023-04-23 MED ORDER — IRBESARTAN 150 MG PO TABS
150.0000 mg | ORAL_TABLET | Freq: Every day | ORAL | Status: DC
  Administered 2023-04-23 – 2023-05-09 (×14): 150 mg via ORAL
  Filled 2023-04-23 (×12): qty 1

## 2023-04-23 MED ORDER — PANTOPRAZOLE SODIUM 40 MG PO TBEC
40.0000 mg | DELAYED_RELEASE_TABLET | Freq: Every day | ORAL | Status: DC
  Administered 2023-04-24 – 2023-05-08 (×15): 40 mg via ORAL
  Filled 2023-04-23 (×13): qty 1

## 2023-04-23 MED ORDER — MIDODRINE HCL 5 MG PO TABS
5.0000 mg | ORAL_TABLET | ORAL | Status: DC
  Administered 2023-05-02 – 2023-05-07 (×3): 5 mg via ORAL
  Filled 2023-04-23 (×3): qty 1

## 2023-04-23 MED ORDER — SODIUM CHLORIDE 0.9 % IV SOLN
2.0000 g | Freq: Once | INTRAVENOUS | Status: DC
  Filled 2023-04-23: qty 12.5

## 2023-04-23 MED ORDER — SODIUM CHLORIDE 0.9 % IV SOLN
500.0000 mg | Freq: Once | INTRAVENOUS | Status: AC
  Administered 2023-04-23: 500 mg via INTRAVENOUS
  Filled 2023-04-23: qty 5

## 2023-04-23 NOTE — ED Provider Notes (Addendum)
-----------------------------------------   8:13 PM on 04/23/2023 -----------------------------------------  The patient has returned from dialysis.  She is still requiring 9 L of O2 through her trach collar which is significantly higher than her baseline requirement.  She continues to report cough and increased shortness of breath, low she does not demonstrate any acute respiratory distress.  Given these persistent symptoms she will need admission for further treatment and workup.  I ordered cefepime and azithromycin as she had been receiving during her admission.  I consulted Dr. Imogene Burn from the hospitalist service; based on our discussion he agreed to evaluate the patient for admission.   Dionne Bucy, MD 04/23/23 2351

## 2023-04-23 NOTE — Assessment & Plan Note (Addendum)
Pseudomonas and corynebacterium growing out of culture.  Patient received cefepime for 10 days total.  Completed antibiotics on 04/29/2023.

## 2023-04-23 NOTE — Assessment & Plan Note (Signed)
Continue with Eliquis 2.5 mg bid.

## 2023-04-23 NOTE — ED Notes (Signed)
Pt threw up small amount.

## 2023-04-23 NOTE — ED Notes (Signed)
Pt transported to dialysis

## 2023-04-23 NOTE — Assessment & Plan Note (Signed)
Chronic trach. Cannot decannulate per ENT.

## 2023-04-23 NOTE — Assessment & Plan Note (Addendum)
Patient can go back on Benicar as outpatient, continue lower dose Coreg

## 2023-04-23 NOTE — Consult Note (Signed)
Pharmacy Antibiotic Note  Deborah [name] is a 67 y.o. female admitted on 04/23/2023 with cough, hypoxia. Pt recently admitted 5/5-5/19/24 for which patient was treated for PNA and received azithromycin 500 x 3 days, ceftriaxone x 2 days and cefepime x3 days (last dose 5/19 @ 1715) and discharged on PO levofloxacin following pseudomonas in tracheal aspirate culture.   Pharmacy has been consulted for cefepime dosing.  Plan: Initiate cefepime 1 gram Q24H  Weight:  (unable to weigh, Pt on strecther, can not stand, too short of breath)  Temp (24hrs), Avg:99.4 F (37.4 C), Min:98.7 F (37.1 C), Max:100.9 F (38.3 C)  Recent Labs  Lab 04/18/23 1527 04/19/23 0524 04/20/23 0456 04/23/23 0930  WBC 10.9* 7.6 7.4 12.2*  CREATININE 5.25* 7.59* 5.49* 9.19*    Estimated Creatinine Clearance: 5.3 mL/min (A) (by C-G formula based on SCr of 9.19 mg/dL (H)).    No Known Allergies  Antimicrobials this admission: 5/20 cefepime >>  5/20 azithromycin >>   Dose adjustments this admission:   Microbiology results: 5/20 tracheal Sputum: ordered   MRSA PCR:   Thank you for allowing pharmacy to be a part of this patient's care.  Sharen Hones, PharmD, BCPS Clinical Pharmacist   04/23/2023 9:27 PM

## 2023-04-23 NOTE — Progress Notes (Signed)
RT placed patient on 30% venturi setup through trach collar for pt to go to dialysis.

## 2023-04-23 NOTE — Assessment & Plan Note (Addendum)
Had HD today.  Continue outpatient hemodialysis per her normal schedule.

## 2023-04-23 NOTE — Assessment & Plan Note (Addendum)
Stable. On Eliquis. On lower dose coreg

## 2023-04-23 NOTE — Assessment & Plan Note (Signed)
Stable

## 2023-04-23 NOTE — ED Provider Notes (Signed)
[facility] Provider Note    Event Date/Time   First MD Initiated Contact with Patient 04/23/23 [phone]     (approximate)   History   Shortness of Breath   HPI  Deborah [name] is a 67 y.o. female presents to the emergency department today because of concerns for shortness of breath.  Patient was discharged from the hospital yesterday after admission for shortness of breath and community-acquired pneumonia.  Started having more shortness of breath today.  Did try taking a breathing treatment at home without any significant relief.  Does also have history of end-stage renal disease and scheduled to get dialysis today.  Denies any swelling in her lower extremities.     Physical Exam   Triage Vital Signs: ED Triage Vitals [04/23/23 0902]  Enc Vitals Group     BP (!) 164/69     Pulse Rate 83     Resp 18     Temp 98.7 F (37.1 C)     Temp Source Oral     SpO2 100 %     Weight      Height      Head Circumference      Peak Flow      Pain Score      Pain Loc      Pain Edu?      Excl. in GC?     Most recent vital signs: Vitals:   04/23/23 0902 04/23/23 0930  BP: (!) 164/69 (!) 146/70  Pulse: 83 81  Resp: 18 (!) 21  Temp: 98.7 F (37.1 C)   SpO2: 100%    General: Awake, alert, oriented. CV:  Good peripheral perfusion. Regular rate and rhythm. Resp:  Normal effort. Lungs clear. Abd:  No distention.  Other:  Tracheostomy in place.   ED Results / Procedures / Treatments   Labs (all labs ordered are listed, but only abnormal results are displayed) Labs Reviewed  CBC WITH DIFFERENTIAL/PLATELET - Abnormal; Notable for the following components:      Result Value   WBC 12.2 (*)    RBC 2.74 (*)    Hemoglobin 8.4 (*)    HCT 25.3 (*)    Neutro Abs 9.8 (*)    Lymphs Abs 0.6 (*)    Monocytes Absolute 1.1 (*)    Eosinophils Absolute 0.6 (*)    All other components within normal limits  BASIC METABOLIC PANEL - Abnormal; Notable for the following  components:   Glucose, Bld 135 (*)    BUN 57 (*)    Creatinine, Ser 9.19 (*)    Calcium 8.8 (*)    GFR, Estimated 4 (*)    All other components within normal limits  SARS CORONAVIRUS 2 BY RT PCR  HEPATITIS B SURFACE ANTIGEN  HEPATITIS B SURFACE ANTIBODY, QUANTITATIVE  TROPONIN I (HIGH SENSITIVITY)  TROPONIN I (HIGH SENSITIVITY)    RADIOLOGY I independently interpreted and visualized the CXR. My interpretation: Lower lung opacities Radiology interpretation:  IMPRESSION:  1. Cardiomegaly with small bilateral pleural effusions and lung base  atelectasis. But otherwise pulmonary vascular congestion appears  improved since 04/18/2023.  2. Stable tracheostomy.     PROCEDURES:  Critical Care performed: No  MEDICATIONS ORDERED IN ED: Medications - No data to display   IMPRESSION / MDM / ASSESSMENT AND PLAN / ED COURSE  I reviewed the triage vital signs and the nursing notes.  Differential diagnosis includes, but is not limited to, fluid overload, infection  Patient's presentation is most consistent with acute presentation with potential threat to life or bodily function.   The patient is on the cardiac monitor to evaluate for evidence of arrhythmia and/or significant heart rate changes.   Patient presents to the emergency department today because of concerns for shortness of breath.  Patient was DC'd from the hospital yesterday after admission for shortness of breath and community-acquired pneumonia.  Chest x-ray does show somewhat improved findings.  Patient is afebrile.  Dr. Thedore Mins with nephrology will help arrange dialysis.  I do think that is reasonable to reassess patient after dialysis to see if her breathing has improved.  She continues to have symptoms she can be readmitted to the hospital.     FINAL CLINICAL IMPRESSION(S) / ED DIAGNOSES   Shortness of breath     Note:  This document was prepared using Dragon voice recognition  software and may include unintentional dictation errors.    Phineas Semen, MD 04/23/23 [phone]

## 2023-04-23 NOTE — Assessment & Plan Note (Addendum)
Upon discharge patient down to 4 L trach collar with good saturations with ambulation today.  Stable to go home since we were able to set up home oxygen.

## 2023-04-23 NOTE — ED Triage Notes (Signed)
Pt arrives via EMS from home for SOB. Pt discharged from here yesterday. Per EMS, pt was 60% on 5lpm via trach collar. EMS administered nebulizer en route with improvement. Pt is supposed to have dialysis today. Pt arrives alert, on trach collar with no clinical signs of distress.

## 2023-04-23 NOTE — Progress Notes (Signed)
[facility]  ROUNDING NOTE   Subjective:   [name] returns to [facility] on 04/23/2023 for shob, ems  Patient states she began having shortness of breath this morning. Reports decreased oxygen sats. She is seen resting on ED stretcher with increased oxygen requirement, 8L via trach collar.  Patient states baseline is 2 to 3 L.  Patient recently admitted and diagnosed with CAP.  Labs stable at this time for renal patient.  Respiratory panel negative for COVID-19.  Chest x-ray shows cardiomegaly with small bilateral pleural effusions and lung base atelectasis.  We have been consulted to provide dialysis.  Objective:  Vital signs in last 24 hours:  Temp:  [98.7 F (37.1 C)-98.8 F (37.1 C)] 98.8 F (37.1 C) (05/20 1530) Pulse Rate:  [76-89] 89 (05/20 1730) Resp:  [17-28] 20 (05/20 1730) BP: (142-173)/(63-80) 171/72 (05/20 1730) SpO2:  [82 %-100 %] 88 % (05/20 1730) FiO2 (%):  [28 %-40 %] 30 % (05/20 1515)  Weight change:  Filed Weights    Intake/Output: No intake/output data recorded.   Intake/Output this shift:  No intake/output data recorded.  Physical Exam: General: NAD, sitting up in bed  Head: Normocephalic, atraumatic. Moist oral mucosal membranes  Eyes: Anicteric  Neck: +tracheostomy  Lungs:  Rhonchi bilaterally   Heart: Regular rate and rhythm  Abdomen:  Soft, nontender  Extremities:  no peripheral edema.  Neurologic: Nonfocal, moving all four extremities  Skin: No lesions  Access: Left AVG    Basic Metabolic Panel: Recent Labs  Lab 04/18/23 1527 04/18/23 1958 04/19/23 0524 04/20/23 0456 04/23/23 0930  NA 139  --  140 137 139  K 3.0*  --  3.7 4.4 4.4  CL 95*  --  97* 98 102  CO2 28  --  29 29 24   GLUCOSE 101*  --  152* 110* 135*  BUN 34*  --  46* 33* 57*  CREATININE 5.25*  --  7.59* 5.49* 9.19*  CALCIUM 8.6*  --  8.0* 8.1* 8.8*  MG  --  1.8  --   --   --      Liver Function Tests: No results for input(s): "AST", "ALT",  "ALKPHOS", "BILITOT", "PROT", "ALBUMIN" in the last 168 hours. No results for input(s): "LIPASE", "AMYLASE" in the last 168 hours. No results for input(s): "AMMONIA" in the last 168 hours.  CBC: Recent Labs  Lab 04/18/23 1527 04/19/23 0524 04/20/23 0456 04/23/23 0930  WBC 10.9* 7.6 7.4 12.2*  NEUTROABS  --   --   --  9.8*  HGB 9.6* 8.6* 8.3* 8.4*  HCT 27.9* 25.3* 24.7* 25.3*  MCV 88.0 89.4 90.8 92.3  PLT 187 156 162 179     Cardiac Enzymes: No results for input(s): "CKTOTAL", "CKMB", "CKMBINDEX", "TROPONINI" in the last 168 hours.  BNP: Invalid input(s): "POCBNP"  CBG: Recent Labs  Lab 04/21/23 1143 04/21/23 1717 04/21/23 2127 04/22/23 0827 04/22/23 1134  GLUCAP 156* 212* 130* 107* 189*     Microbiology: Results for orders placed or performed during the hospital encounter of 04/23/23  SARS Coronavirus 2 by RT PCR (hospital order, performed in [facility] hospital lab) *cepheid single result test* Anterior Nasal Swab     Status: None   Collection Time: 04/23/23  9:30 AM   Specimen: Anterior Nasal Swab  Result Value Ref Range Status   SARS Coronavirus 2 by RT PCR NEGATIVE NEGATIVE Final    Comment: (NOTE) SARS-CoV-2 target nucleic acids are NOT DETECTED.  The SARS-CoV-2 RNA  is generally detectable in upper and lower respiratory specimens during the acute phase of infection. The lowest concentration of SARS-CoV-2 viral copies this assay can detect is 250 copies / mL. A negative result does not preclude SARS-CoV-2 infection and should not be used as the sole basis for treatment or other patient management decisions.  A negative result may occur with improper specimen collection / handling, submission of specimen other than nasopharyngeal swab, presence of viral mutation(s) within the areas targeted by this assay, and inadequate number of viral copies (<250 copies / mL). A negative result must be combined with clinical observations, patient history, and  epidemiological information.  Fact Sheet for Patients:   RoadLapTop.co.za  Fact Sheet for Healthcare Providers: http://kim-miller.com/  This test is not yet approved or  cleared by the [place] FDA and has been authorized for detection and/or diagnosis of SARS-CoV-2 by FDA under an Emergency Use Authorization (EUA).  This EUA will remain in effect (meaning this test can be used) for the duration of the COVID-19 declaration under Section 564(b)(1) of the Act, 21 U.S.C. section 360bbb-3(b)(1), unless the authorization is terminated or revoked sooner.  Performed at [facility], [address] [place], [place] [address]     Coagulation Studies: No results for input(s): "LABPROT", "INR" in the last 72 hours.   Urinalysis: No results for input(s): "COLORURINE", "LABSPEC", "PHURINE", "GLUCOSEU", "HGBUR", "BILIRUBINUR", "KETONESUR", "PROTEINUR", "UROBILINOGEN", "NITRITE", "LEUKOCYTESUR" in the last 72 hours.  Invalid input(s): "APPERANCEUR"    Imaging: DG Chest Portable 1 View  Result Date: 04/23/2023 CLINICAL DATA:  67 year old female with shortness of breath. Recent hospitalization. EXAM: PORTABLE CHEST 1 VIEW COMPARISON:  Chest CT 04/18/2023. FINDINGS: Portable AP upright view at 0927 hours. Stable tracheostomy. Cardiac prosthetic valve and cardiomegaly. Stable cardiac size and mediastinal contours. Increasing bilateral lung base veiling opacity typical of a fusions, lower lung vascular congestion. Pleural effusion volume still probably small. Upper lung vascular congestion appears mildly improved from the previous chest x-ray. No pneumothorax. No definite air bronchograms. No acute osseous abnormality identified. Negative visible bowel gas. IMPRESSION: 1. Cardiomegaly with small bilateral pleural effusions and lung base atelectasis. But otherwise pulmonary vascular congestion appears improved since 04/18/2023. 2. Stable  tracheostomy. Electronically Signed   By: Odessa Fleming M.D.   On: 04/23/2023 09:38     Medications:      [START ON 04/24/2023] Chlorhexidine Gluconate Cloth  6 each Topical Q0600     Assessment/ Plan:  [name] is a 67 y.o.  female with end stage renal disease on hemodialysis, tracheostomy, hypertension, hyperlipidemia, diabetes mellitus type II insulin dependent, depression, and diabetic neuropathy who presents to [facility] for 04/23/2023 for shob, ems  [facility] M[facility] [address] Left AVG 62kg  End Stage Kidney Disease:  - Last dialysis treatment received on Friday.  -Will provide dialysis treatment UF goal 1.5L as tolerated.  - Next treatment scheduled for Wednesday  Hypertension with chronic kidney disease: current regimen of irbesartan, carvedilol and amlodipine. Home regimen of olmesartan instead of irbesartan. Also prescribed Midodrine to prevent hypotension during dialysis.    Diabetes mellitus type II with chronic kidney disease: insulin dependent.   Secondary Hyperparathyroidism  - Calcium acceptable.  - Continue sevelamer with meals  Anemia of chronic kidney disease: hemoglobin 8.4 - ESA prescribed with dialysis   LOS: 0   5/20/20245:31 PM

## 2023-04-23 NOTE — Progress Notes (Signed)
Received patient in bed to unit.    Informed consent signed and in chart.    TX duration:2hr     Transported By hemo RN and hospital transporter Hand-off given to patient's nurse.    Access used:L AVF  Access issues: None   Total UF removed: 1.3kg Medication(s) given: Post HD VS:  Post HD weight: Unable to weigh      [facility]

## 2023-04-23 NOTE — Subjective & Objective (Signed)
CC: cough, hypoxia HPI: 67 year old African-American female history of end-stage renal disease on dialysis Monday Wednesday Friday, paroxysmal atrial fibrillation on Eliquis, history of tracheostomy, chronic diastolic heart failure, chronic hypoxia at night, uncontrolled type 2 diabetes, hypertension, presents the ER today with worsening cough and hypoxia.  Patient was recently discharged the hospital yesterday after spending 5 days in the hospital with pneumonia.  Her tracheal aspirate cultures grew out Pseudomonas aeruginosa.  This was sensitive to cefepime.  She had been on cefepime during her hospital course.  She received her last dose on 04/22/2023 around 5 PM.  She was discharged to home on Levaquin.  Family has not picked up this prescription yet.  She was brought back today due to worsening cough and hypoxia.  She is up to 9 L of oxygen due to hypoxia in the ER.  She has not had any fevers.  She complains of a worsening cough.  Initially ER provider thought this was volume overload and sent the patient to emergent dialysis.  Patient was dialyzed.  But the patient remained hypoxic on 8 to 9 L.  White cell count up to 12.2.  It was 7.4 on 04/20/2023.  [facility] hospital[facility] contacted for admission.

## 2023-04-23 NOTE — H&P (Signed)
History and Physical    Deborah [name] ZOX:[id number] DOB: 07-23-1956 DOA: 04/23/2023  DOS: the patient was seen and examined on 04/23/2023  PCP: Si Gaul, MD   Patient coming from: Home  I have personally briefly reviewed patient's old medical records in [facility]  CC: cough, hypoxia HPI: 67 year old African-American female history of end-stage renal disease on dialysis Monday Wednesday Friday, paroxysmal atrial fibrillation on Eliquis, history of tracheostomy, chronic diastolic heart failure, chronic hypoxia at night, uncontrolled type 2 diabetes, hypertension, presents the ER today with worsening cough and hypoxia.  Patient was recently discharged the hospital yesterday after spending 5 days in the hospital with pneumonia.  Her tracheal aspirate cultures grew out Pseudomonas aeruginosa.  This was sensitive to cefepime.  She had been on cefepime during her hospital course.  She received her last dose on 04/22/2023 around 5 PM.  She was discharged to home on Levaquin.  Family has not picked up this prescription yet.  She was brought back today due to worsening cough and hypoxia.  She is up to 9 L of oxygen due to hypoxia in the ER.  She has not had any fevers.  She complains of a worsening cough.  Initially ER provider thought this was volume overload and sent the patient to emergent dialysis.  Patient was dialyzed.  But the patient remained hypoxic on 8 to 9 L.  White cell count up to 12.2.  It was 7.4 on 04/20/2023.  [facility] hospitalist contacted for admission.   ED Course: hypoxic. 86% on 6 L/min trach collar. WBC 12.2  Review of Systems:  Review of Systems  Constitutional:  Positive for chills. Negative for fever.  HENT: Negative.    Eyes: Negative.   Respiratory:  Positive for cough and shortness of breath.        Son states only a small about of tracheal secretions with suctioning.  Cardiovascular: Negative.   Gastrointestinal: Negative.   Genitourinary: Negative.    Musculoskeletal: Negative.   Skin: Negative.   Neurological: Negative.   Endo/Heme/Allergies: Negative.   Psychiatric/Behavioral: Negative.    All other systems reviewed and are negative.   Past Medical History:  Diagnosis Date   Acute on chronic diastolic CHF (congestive heart failure) ([name]) 01/04/2022   Atrial fibrillation ([name])    Atrial flutter ([name])    Cardiogenic shock ([name]) 08/17/2016   Diabetes mellitus    Diabetes mellitus type 2, controlled ([name]) 03/08/2012   ESRD (end stage renal disease) ([name])    Heart attack ([name])    Hyperlipidemia    Hypertension    MI, old    NSTEMI (non-ST elevated myocardial infarction) ([name])    Renal disorder    dialysls   Respiratory failure ([name]) 08/17/2016   ST elevation myocardial infarction involving left main coronary artery ([name])    STEMI (ST elevation myocardial infarction) ([name]) 08/15/2016   Type 2 diabetes mellitus with complication, with long-term current use of insulin [facility])     Past Surgical History:  Procedure Laterality Date   A/V FISTULAGRAM N/A 10/05/2017   Procedure: A/V Fistulagram;  Surgeon: Renford Dills, MD;  Location: [facility] INVASIVE CV LAB;  Service: Cardiovascular;  Laterality: N/A;   A/V SHUNTOGRAM N/A 10/05/2017   Procedure: A/V SHUNTOGRAM;  Surgeon: Renford Dills, MD;  Location: [facility] INVASIVE CV LAB;  Service: Cardiovascular;  Laterality: N/A;   AV FISTULA PLACEMENT Left 10/02/2016   Procedure: INSERTION OF ARTERIOVENOUS (AV) GORE-TEX GRAFT ARM;  Surgeon: Maeola Harman, MD;  Location: [facility] OR;  Service: Vascular;  Laterality: Left;   CARDIAC CATHETERIZATION N/A 08/15/2016   Procedure: Left Heart Cath and Coronary Angiography;  Surgeon: Alwyn Pea, MD;  Location: [facility] INVASIVE CV LAB;  Service: Cardiovascular;  Laterality: N/A;   CARDIAC CATHETERIZATION N/A 08/15/2016   Procedure: Coronary Stent Intervention;  Surgeon: Alwyn Pea, MD;  Location: [facility] INVASIVE CV LAB;  Service:  Cardiovascular;  Laterality: N/A;   CARDIAC CATHETERIZATION N/A 08/18/2016   Procedure: Right Heart Cath;  Surgeon: Dolores Patty, MD;  Location: [facility] INVASIVE CV LAB;  Service: Cardiovascular;  Laterality: N/A;   CARDIAC CATHETERIZATION N/A 08/18/2016   Procedure: IABP Insertion;  Surgeon: Dolores Patty, MD;  Location: [facility] INVASIVE CV LAB;  Service: Cardiovascular;  Laterality: N/A;   CARDIAC CATHETERIZATION Right 08/23/2016   Procedure: CENTRAL LINE INSERTION RIGHT SUBCLAVIAN;  Surgeon: Kerin Perna, MD;  Location: [facility] OR;  Service: Open Heart Surgery;  Laterality: Right;   ENDOVEIN HARVEST OF GREATER SAPHENOUS VEIN Left 08/23/2016   Procedure: ENDOVEIN HARVEST OF GREATER SAPHENOUS VEIN;  Surgeon: Kerin Perna, MD;  Location: [facility] OR;  Service: Open Heart Surgery;  Laterality: Left;   INSERTION OF DIALYSIS CATHETER Bilateral 08/31/2016   Procedure: INSERTION OF DIALYSIS CATHETER LEFT INTERNAL JUGULAR VEIN & INSERTION OF TRIPLE LUMEN RIGHT INTERNAL JUGULAR VEIN;  Surgeon: Chuck Hint, MD;  Location: [facility] OR;  Service: Vascular;  Laterality: Bilateral;   INTRAOPERATIVE TRANSESOPHAGEAL ECHOCARDIOGRAM N/A 08/23/2016   Procedure: INTRAOPERATIVE TRANSESOPHAGEAL ECHOCARDIOGRAM;  Surgeon: Kerin Perna, MD;  Location: [facility] OR;  Service: Open Heart Surgery;  Laterality: N/A;   IR GENERIC HISTORICAL  11/13/2016   IR US GUIDE VASC ACCESS LEFT 11/13/2016 Gilmer Mor, DO [facility]INTER[facility]   IR GENERIC HISTORICAL  11/13/2016   IR FLUORO GUIDE CV LINE LEFT 11/13/2016 Gilmer Mor, DO [facility]   IR GENERIC HISTORICAL  11/13/2016   IR GASTROSTOMY TUBE MOD SED 11/13/2016 Gilmer Mor, DO [facility]   MITRAL VALVE REPAIR N/A 08/23/2016   Procedure: MITRAL VALVE REPAIR (MVR) USING EDWARDS MAGNA EASE BIOPROSTHESIS MITRAL  VALVE;  Surgeon: Kerin Perna, MD;  Location: [facility] OR;  Service: Open Heart Surgery;  Laterality: N/A;   tracheostomy reversal     TRACHEOSTOMY TUBE PLACEMENT N/A 11/09/2016    Procedure: TRACHEOSTOMY;  Surgeon: Suzanna Obey, MD;  Location: [facility] OR;  Service: ENT;  Laterality: N/A;   VAGINAL DELIVERY     x 6     reports that she has never smoked. She has never used smokeless tobacco. She reports that she does not drink alcohol and does not use drugs.  No Known Allergies  Family History  Problem Relation Age of Onset   Hypertension Mother    Diabetes Mother    Breast cancer Sister    Hypertension Father     Prior to Admission medications   Medication Sig Start Date End Date Taking? Authorizing Provider  acetaminophen (TYLENOL) 325 MG tablet Take 650 mg by mouth every 6 (six) hours as needed for mild pain or fever.  04/06/17   [provider]  albuterol (PROVENTIL) (2.5 MG/3ML) 0.083% nebulizer solution Take 3 mLs (2.5 mg total) by nebulization 4 (four) times daily - after meals and at bedtime. 11/01/16   Love, Evlyn Kanner, PA-C  amLODipine (NORVASC) 10 MG tablet Take 10 mg by mouth daily. 08/12/20   [provider]  apixaban (ELIQUIS) 2.5 MG TABS tablet Take 2.5 mg by mouth 2 (two) times daily.  06/22/17   [provider]  aspirin 81 MG EC tablet Take 1 tablet (81 mg total) by mouth daily. 10/24/19   Swayze, Ava, DO  atorvastatin (LIPITOR) 80 MG tablet Take 80 mg by mouth daily at 6 PM.     [provider]  carvedilol (COREG) 25 MG tablet Take 25 mg by mouth 2 (two) times daily. 04/22/18   [provider]  escitalopram (LEXAPRO) 10 MG tablet Take 10 mg by mouth daily. 09/13/20   [provider]  gabapentin (NEURONTIN) 300 MG capsule Take 1 capsule (300 mg total) by mouth at bedtime. 04/04/20   Standley Brooking, MD  guaiFENesin-dextromethorphan (ROBITUSSIN DM) 100-10 MG/5ML syrup Take 5 mLs by mouth every 4 (four) hours as needed for cough. 04/22/23   Arnetha Courser, MD  LEVEMIR FLEXTOUCH 100 UNIT/ML FlexPen Inject 4 Units into the skin 2 (two) times daily. 03/05/20   [provider]  levofloxacin (LEVAQUIN) 250  MG tablet Take 1 tablet (250 mg total) by mouth daily for 5 days. 04/22/23 04/27/23  Arnetha Courser, MD  Lidocaine, Anorectal, 5 % CREA Sparingly Topical As Directed 04/03/23   [provider]  lidocaine-prilocaine (EMLA) cream Apply 1 application topically every dialysis. 09/26/19   [provider]  midodrine (PROAMATINE) 5 MG tablet Take 5 mg by mouth 3 (three) times a week.    [provider]  multivitamin (RENA-VIT) TABS tablet Take 1 tablet by mouth daily. 01/17/23   [provider]  olmesartan (BENICAR) 20 MG tablet Take 20 mg by mouth daily. 03/12/20   [provider]  pantoprazole (PROTONIX) 40 MG tablet Take 1 tablet (40 mg total) by mouth 2 (two) times daily. 11/01/16   Love, Evlyn Kanner, PA-C  sevelamer carbonate (RENVELA) 800 MG tablet Take 1,600 mg by mouth 3 (three) times daily with meals.    [provider]  traZODone (DESYREL) 50 MG tablet Take 50 mg by mouth 3 (three) times a week. Take on Dialysis days 04/04/18   [provider]    Physical Exam: Vitals:   04/23/23 1800 04/23/23 1830 04/23/23 1838 04/23/23 2000  BP: (!) 182/84 (!) 185/85 (!) 181/87 (!) 172/69  Pulse: 97 (!) 101 (!) 111 96  Resp: (!) 30 (!) 32 (!) 29 (!) 24  Temp:   99.3 F (37.4 C)   TempSrc:   Oral   SpO2: (!) 84% 92% 91% 93%    Physical Exam Vitals and nursing note reviewed.  Constitutional:      General: She is not in acute distress.    Appearance: She is not toxic-appearing or diaphoretic.  HENT:     Head: Normocephalic and atraumatic.     Nose: Nose normal.  Neck:     Comments: +trach Cardiovascular:     Rate and Rhythm: Normal rate and regular rhythm.     Pulses: Normal pulses.  Pulmonary:     Effort: No prolonged expiration or respiratory distress.     Breath sounds: Examination of the right-lower field reveals rales. Examination of the left-lower field reveals rales. Rales present.  Abdominal:     General: Bowel sounds are normal.  There is no distension.     Palpations: Abdomen is soft.     Tenderness: There is no guarding or rebound.  Musculoskeletal:     Right lower leg: No edema.     Left lower leg: No edema.  Skin:    General: Skin is warm and dry.     Capillary Refill:  Capillary refill takes less than 2 seconds.  Neurological:     General: No focal deficit present.     Mental Status: She is alert and oriented to person, place, and time.      Labs on Admission: I have personally reviewed following labs and imaging studies  CBC: Recent Labs  Lab 04/18/23 1527 04/19/23 0524 04/20/23 0456 04/23/23 0930  WBC 10.9* 7.6 7.4 12.2*  NEUTROABS  --   --   --  9.8*  HGB 9.6* 8.6* 8.3* 8.4*  HCT 27.9* 25.3* 24.7* 25.3*  MCV 88.0 89.4 90.8 92.3  PLT 187 156 162 179   Basic Metabolic Panel: Recent Labs  Lab 04/18/23 1527 04/18/23 1958 04/19/23 0524 04/20/23 0456 04/23/23 0930  NA 139  --  140 137 139  K 3.0*  --  3.7 4.4 4.4  CL 95*  --  97* 98 102  CO2 28  --  29 29 24   GLUCOSE 101*  --  152* 110* 135*  BUN 34*  --  46* 33* 57*  CREATININE 5.25*  --  7.59* 5.49* 9.19*  CALCIUM 8.6*  --  8.0* 8.1* 8.8*  MG  --  1.8  --   --   --    GFR: Estimated Creatinine Clearance: 5.3 mL/min (A) (by C-G formula based on SCr of 9.19 mg/dL (H)).  Coagulation Profile: Recent Labs  Lab 04/18/23 1527  INR 1.7*   Cardiac Enzymes: Recent Labs  Lab 04/18/23 2100 04/18/23 2349 04/23/23 0930 04/23/23 1200  TROPONINIHS 21* 20* 14 12   BNP (last 3 results) Recent Labs    04/18/23 1627  BNP 1,400.4*   CBG: Recent Labs  Lab 04/21/23 1143 04/21/23 1717 04/21/23 2127 04/22/23 0827 04/22/23 1134  GLUCAP 156* 212* 130* 107* 189*   Radiological Exams on Admission: I have personally reviewed images  DG Chest Portable 1 View  Result Date: 04/23/2023 CLINICAL DATA:  67 year old female with shortness of breath. Recent hospitalization. EXAM: PORTABLE CHEST 1 VIEW COMPARISON:  Chest CT 04/18/2023.  FINDINGS: Portable AP upright view at 0927 hours. Stable tracheostomy. Cardiac prosthetic valve and cardiomegaly. Stable cardiac size and mediastinal contours. Increasing bilateral lung base veiling opacity typical of a fusions, lower lung vascular congestion. Pleural effusion volume still probably small. Upper lung vascular congestion appears mildly improved from the previous chest x-ray. No pneumothorax. No definite air bronchograms. No acute osseous abnormality identified. Negative visible bowel gas. IMPRESSION: 1. Cardiomegaly with small bilateral pleural effusions and lung base atelectasis. But otherwise pulmonary vascular congestion appears improved since 04/18/2023. 2. Stable tracheostomy. Electronically Signed   By: Odessa Fleming M.D.   On: 04/23/2023 09:38    EKG: My personal interpretation of EKG shows: no EKG to review  Assessment/Plan Principal Problem:   Acute on chronic respiratory failure with hypoxia ([name]) Active Problems:   CAP (community acquired pneumonia)   S/P MVR (mitral valve replacement)   PAF (paroxysmal atrial fibrillation) ([name])   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, W, F   Type 2 diabetes mellitus with chronic kidney disease, with long-term current use of insulin ([name])   Chronic anticoagulation   Tracheostomy status ([name])   Chronic diastolic CHF (congestive heart failure) ([name])   Essential hypertension    Assessment and Plan: * Acute on chronic respiratory failure with hypoxia ([name]) Observation progressive telemetry bed. Will obtain repeat non-contrast CT chest to compare from 04-18-2023 study. Pt had HD today so volume overload should not be the reason for her acute on  chronic hypoxia. Pt was getting HD during her hospital stay. Pt was not febrile during last 72 hours of hospital stay. Pt has remained on Eliquis during hospital stay so PE is unlikely.  CAP (community acquired pneumonia) Pt has pseudomonas grow out of her trach aspirate culture. Pt had been on IV cefepime during  hospital stay. Pseudomonas was sensitive to cefepime.  Pt's family did not pick up levaquin that was prescribed at discharge yesterday. Pt's last dose of Cefepime was around 5 pm on 04-22-2023 and it was only being dosed q24 hours due to HD. Will continue IV cefepime for now. Recheck trach aspirate. Obtain CT chest to see if infiltrate has not improved. Pt with hx of MSSA on her trach aspirate in the past.  May need ID consult. Other possibility is that pt needs longer duration with IV cefepime. Could be dosed during HD as outpatient? Would need to ask nephrology if that is possible.  Essential hypertension Stable.  Chronic diastolic CHF (congestive heart failure) ([name]) Stable. Had HD today. Continue GMDT.  Tracheostomy status ([name]) Chronic trach. Cannot decannulate per ENT.  Chronic anticoagulation Continue with Eliquis 2.5 mg bid.  Type 2 diabetes mellitus with chronic kidney disease, with long-term current use of insulin ([name]) Continue insulin.  ESRD on dialysis [facility]) - M, W, F Had HD today. Will need routine HD during this week.  PAF (paroxysmal atrial fibrillation) ([name]) Stable. On Eliquis. On coreg  S/P MVR (mitral valve replacement) Stable.   DVT prophylaxis: Eliquis Code Status: Full Code Family Communication: discussed with pt and family at bedside  Disposition Plan: return home  Consults called: none  Admission status: Observation, Telemetry bed   Carollee Herter, DO [facility] 04/23/2023, 8:58 PM

## 2023-04-23 NOTE — Assessment & Plan Note (Addendum)
On Levemir 4 units twice a day.  Last hemoglobin A1c 7.2.

## 2023-04-23 NOTE — Assessment & Plan Note (Addendum)
Stable. Had HD today.  Dialysis to manage fluid.

## 2023-04-24 ENCOUNTER — Other Ambulatory Visit: Payer: Self-pay

## 2023-04-24 ENCOUNTER — Encounter: Payer: Self-pay | Admitting: Internal Medicine

## 2023-04-24 DIAGNOSIS — Z794 Long term (current) use of insulin: Secondary | ICD-10-CM | POA: Diagnosis not present

## 2023-04-24 DIAGNOSIS — Z930 Tracheostomy status: Secondary | ICD-10-CM | POA: Diagnosis not present

## 2023-04-24 DIAGNOSIS — I5033 Acute on chronic diastolic (congestive) heart failure: Secondary | ICD-10-CM | POA: Diagnosis present

## 2023-04-24 DIAGNOSIS — Z7901 Long term (current) use of anticoagulants: Secondary | ICD-10-CM | POA: Diagnosis not present

## 2023-04-24 DIAGNOSIS — J9621 Acute and chronic respiratory failure with hypoxia: Secondary | ICD-10-CM | POA: Diagnosis present

## 2023-04-24 DIAGNOSIS — N2581 Secondary hyperparathyroidism of renal origin: Secondary | ICD-10-CM | POA: Diagnosis present

## 2023-04-24 DIAGNOSIS — A048 Other specified bacterial intestinal infections: Secondary | ICD-10-CM | POA: Diagnosis not present

## 2023-04-24 DIAGNOSIS — A09 Infectious gastroenteritis and colitis, unspecified: Secondary | ICD-10-CM | POA: Diagnosis not present

## 2023-04-24 DIAGNOSIS — J151 Pneumonia due to Pseudomonas: Secondary | ICD-10-CM | POA: Diagnosis present

## 2023-04-24 DIAGNOSIS — Z992 Dependence on renal dialysis: Secondary | ICD-10-CM | POA: Diagnosis not present

## 2023-04-24 DIAGNOSIS — J988 Other specified respiratory disorders: Secondary | ICD-10-CM | POA: Diagnosis not present

## 2023-04-24 DIAGNOSIS — I482 Chronic atrial fibrillation, unspecified: Secondary | ICD-10-CM | POA: Diagnosis present

## 2023-04-24 DIAGNOSIS — N898 Other specified noninflammatory disorders of vagina: Secondary | ICD-10-CM | POA: Diagnosis present

## 2023-04-24 DIAGNOSIS — J189 Pneumonia, unspecified organism: Secondary | ICD-10-CM | POA: Diagnosis not present

## 2023-04-24 DIAGNOSIS — J9811 Atelectasis: Secondary | ICD-10-CM | POA: Diagnosis present

## 2023-04-24 DIAGNOSIS — N186 End stage renal disease: Secondary | ICD-10-CM

## 2023-04-24 DIAGNOSIS — I480 Paroxysmal atrial fibrillation: Secondary | ICD-10-CM | POA: Diagnosis present

## 2023-04-24 DIAGNOSIS — R590 Localized enlarged lymph nodes: Secondary | ICD-10-CM | POA: Diagnosis present

## 2023-04-24 DIAGNOSIS — I132 Hypertensive heart and chronic kidney disease with heart failure and with stage 5 chronic kidney disease, or end stage renal disease: Secondary | ICD-10-CM | POA: Diagnosis present

## 2023-04-24 DIAGNOSIS — L299 Pruritus, unspecified: Secondary | ICD-10-CM | POA: Diagnosis present

## 2023-04-24 DIAGNOSIS — B965 Pseudomonas (aeruginosa) (mallei) (pseudomallei) as the cause of diseases classified elsewhere: Secondary | ICD-10-CM | POA: Diagnosis not present

## 2023-04-24 DIAGNOSIS — E785 Hyperlipidemia, unspecified: Secondary | ICD-10-CM | POA: Diagnosis present

## 2023-04-24 DIAGNOSIS — Z1152 Encounter for screening for COVID-19: Secondary | ICD-10-CM | POA: Diagnosis not present

## 2023-04-24 DIAGNOSIS — I2721 Secondary pulmonary arterial hypertension: Secondary | ICD-10-CM | POA: Diagnosis present

## 2023-04-24 DIAGNOSIS — E1140 Type 2 diabetes mellitus with diabetic neuropathy, unspecified: Secondary | ICD-10-CM | POA: Diagnosis present

## 2023-04-24 DIAGNOSIS — E1122 Type 2 diabetes mellitus with diabetic chronic kidney disease: Secondary | ICD-10-CM | POA: Diagnosis present

## 2023-04-24 DIAGNOSIS — E663 Overweight: Secondary | ICD-10-CM | POA: Insufficient documentation

## 2023-04-24 DIAGNOSIS — Z9981 Dependence on supplemental oxygen: Secondary | ICD-10-CM | POA: Diagnosis not present

## 2023-04-24 DIAGNOSIS — Z952 Presence of prosthetic heart valve: Secondary | ICD-10-CM | POA: Diagnosis not present

## 2023-04-24 DIAGNOSIS — D631 Anemia in chronic kidney disease: Secondary | ICD-10-CM | POA: Diagnosis present

## 2023-04-24 LAB — CBC WITH DIFFERENTIAL/PLATELET
Abs Immature Granulocytes: 0.03 10*3/uL (ref 0.00–0.07)
Basophils Absolute: 0.1 10*3/uL (ref 0.0–0.1)
Basophils Relative: 0 %
Eosinophils Absolute: 0.2 10*3/uL (ref 0.0–0.5)
Eosinophils Relative: 1 %
HCT: 23.6 % — ABNORMAL LOW (ref 36.0–46.0)
Hemoglobin: 8 g/dL — ABNORMAL LOW (ref 12.0–15.0)
Immature Granulocytes: 0 %
Lymphocytes Relative: 4 %
Lymphs Abs: 0.5 10*3/uL — ABNORMAL LOW (ref 0.7–4.0)
MCH: 31 pg (ref 26.0–34.0)
MCHC: 33.9 g/dL (ref 30.0–36.0)
MCV: 91.5 fL (ref 80.0–100.0)
Monocytes Absolute: 0.9 10*3/uL (ref 0.1–1.0)
Monocytes Relative: 8 %
Neutro Abs: 10.4 10*3/uL — ABNORMAL HIGH (ref 1.7–7.7)
Neutrophils Relative %: 87 %
Platelets: 193 10*3/uL (ref 150–400)
RBC: 2.58 MIL/uL — ABNORMAL LOW (ref 3.87–5.11)
RDW: 15.4 % (ref 11.5–15.5)
WBC: 12.1 10*3/uL — ABNORMAL HIGH (ref 4.0–10.5)
nRBC: 0 % (ref 0.0–0.2)

## 2023-04-24 LAB — COMPREHENSIVE METABOLIC PANEL
ALT: 21 U/L (ref 0–44)
AST: 17 U/L (ref 15–41)
Albumin: 3.2 g/dL — ABNORMAL LOW (ref 3.5–5.0)
Alkaline Phosphatase: 65 U/L (ref 38–126)
Anion gap: 9 (ref 5–15)
BUN: 31 mg/dL — ABNORMAL HIGH (ref 8–23)
CO2: 29 mmol/L (ref 22–32)
Calcium: 8.4 mg/dL — ABNORMAL LOW (ref 8.9–10.3)
Chloride: 98 mmol/L (ref 98–111)
Creatinine, Ser: 5.95 mg/dL — ABNORMAL HIGH (ref 0.44–1.00)
GFR, Estimated: 7 mL/min — ABNORMAL LOW (ref >60)
Glucose, Bld: 148 mg/dL — ABNORMAL HIGH (ref 70–99)
Potassium: 4.4 mmol/L (ref 3.5–5.1)
Sodium: 136 mmol/L (ref 135–145)
Total Bilirubin: 1.5 mg/dL — ABNORMAL HIGH (ref 0.3–1.2)
Total Protein: 6.2 g/dL — ABNORMAL LOW (ref 6.5–8.1)

## 2023-04-24 LAB — GLUCOSE, CAPILLARY
Glucose-Capillary: 124 mg/dL — ABNORMAL HIGH (ref 70–99)
Glucose-Capillary: 168 mg/dL — ABNORMAL HIGH (ref 70–99)

## 2023-04-24 LAB — PROCALCITONIN: Procalcitonin: 0.52 ng/mL

## 2023-04-24 LAB — HEPATITIS B SURFACE ANTIBODY, QUANTITATIVE: Hep B S AB Quant (Post): 2058 m[IU]/mL (ref >9.9)

## 2023-04-24 LAB — CBG MONITORING, ED
Glucose-Capillary: 104 mg/dL — ABNORMAL HIGH (ref 70–99)
Glucose-Capillary: 180 mg/dL — ABNORMAL HIGH (ref 70–99)

## 2023-04-24 MED ORDER — EPOETIN ALFA 10000 UNIT/ML IJ SOLN
10000.0000 [IU] | INTRAMUSCULAR | Status: DC
  Administered 2023-04-27 – 2023-05-09 (×5): 10000 [IU] via INTRAVENOUS
  Filled 2023-04-24 (×7): qty 1

## 2023-04-24 MED ORDER — TRAZODONE HCL 50 MG PO TABS
25.0000 mg | ORAL_TABLET | Freq: Once | ORAL | Status: AC
  Administered 2023-04-25: 25 mg via ORAL
  Filled 2023-04-24: qty 1

## 2023-04-24 NOTE — Hospital Course (Addendum)
67 year old African-American female history of end-stage renal disease on dialysis Monday Wednesday Friday, paroxysmal atrial fibrillation on Eliquis, history of tracheostomy, chronic diastolic heart failure, chronic hypoxia at night, uncontrolled type 2 diabetes, hypertension, presents the ER today with worsening cough and hypoxia.  Patient was recently in the hospital and discharged 5/19 after treatment for pneumonia. Chest x-ray showed significant volume overload, patient was dialyzed after admission 5/20.   Patient completed a course of antibiotics for Pseudomonas pneumonia.  Patient also received Zithromax for vibrio growing out of the stool culture.  Patient progressed over the 16-day hospital course with improvement of her respiratory status.  With the trach collar here in the hospital she did require a higher liter flow during the hospital course.  Upon discharge she was on 4 L trach collar and saturated well with ambulation.  Stable for discharge home.  TOC able to set up home oxygen company for her.  We will resume home nursing services through [facility].  Home health PT and OT also arranged.

## 2023-04-24 NOTE — Consult Note (Addendum)
NAME: Deborah [name]  DOB: 03-10-1956  MRN: [id number]  Date/Time: 04/24/2023 6:04 PM  REQUESTING PROVIDER: Dr. Chipper Herb Subjective:  REASON FOR CONSULT: pseudomonas pneumonia ? Deborah [name] is a 67 y.o.female  with complicated medical history including a history of respiratory failure with prolonged ventilatory support from complications related to cardiogenic shock and hospital-acquired pneumonia in 2017 leading to tracheostomy, posterior glottic stenosis, revision tracheostomy on 01/18/2017, right cordotomy with medial arytenoidectomy on 02/14/2017 ,end-stage renal disease on dialysis ,  coronary artery disease with acute MI and placement of stent in OM1 in September 2017 complicated by papillary muscle rupture leading to emergent mitral valve replacement, paroxysmal A-fib, diabetes mellitus was recently in [facility] between 04/18/2023 until 04/22/2023 for sickness, weakness, excessive sleepiness, vomiting and diarrhea and shortness of breath with cough.  At that time the chest check CT showed patchy infiltrates in the mid and lower lung fields right more than the left suggestive of pneumonia versus atelectasis.  She was initially started on on ceftriaxone and Zithromax.  Tracheal aspirate grew Pseudomonas aeruginosa.  Was discharged on Friday, 08/14/2023 on Levaquin came back to the ED on 04/23/2023 with shortness of breath.  She was 60% on 5 L oxygen through tracheal collar.  EMS administered nebulizer en route with improvement. On the vitals on arrival BP of 164/69, pulse of 83, temperature 98.7 and respiratory rate of 18 with sats of 100%. Labs revealed WBC of 12.2, Hb of 8.6, creatinine of 9.19, platelet 179 Chest x-ray showed cardiomegaly with small bilateral pleural effusion and lung base atelectasis.   She underwent dialysis.  She was still requiring 9 L of oxygen . She received She was started on cefepime and azithromycin. After discharge on 04/21/2019 4 in the evening Levaquin prescription was still not  picked up but she had not missed any doses because cefepime which she received on 04/21/2023 was every 24 hrs CT chest revealed features of congestive heart failure with global cardiomegaly which was mild with enlarging bilateral pleural effusion and mild interstitial and alveolar pulmonary edema.  There was also shotty mediastinal adenopathy. And multivessel coronary artery calcification was seen on the CAT scan.  There was features of pulmonary artery hypertension. I am asked to see patient for pseudomonas  pneumonia Past Medical History:  Diagnosis Date   Acute on chronic diastolic CHF (congestive heart failure) ([name]) 01/04/2022   Atrial fibrillation ([name])    Atrial flutter ([name])    Cardiogenic shock ([name]) 08/17/2016   Diabetes mellitus    Diabetes mellitus type 2, controlled ([name]) 03/08/2012   ESRD (end stage renal disease) ([name])    Heart attack ([name])    Hyperlipidemia    Hypertension    MI, old    NSTEMI (non-ST elevated myocardial infarction) ([name])    Renal disorder    dialysls   Respiratory failure ([name]) 08/17/2016   ST elevation myocardial infarction involving left main coronary artery ([name])    STEMI (ST elevation myocardial infarction) ([name]) 08/15/2016   Type 2 diabetes mellitus with complication, with long-term current use of insulin [facility])     Past Surgical History:  Procedure Laterality Date   A/V FISTULAGRAM N/A 10/05/2017   Procedure: A/V Fistulagram;  Surgeon: Renford Dills, MD;  Location: [facility] INVASIVE CV LAB;  Service: Cardiovascular;  Laterality: N/A;   A/V SHUNTOGRAM N/A 10/05/2017   Procedure: A/V SHUNTOGRAM;  Surgeon: Renford Dills, MD;  Location: [facility] INVASIVE CV LAB;  Service: Cardiovascular;  Laterality: N/A;   AV FISTULA PLACEMENT Left  10/02/2016   Procedure: INSERTION OF ARTERIOVENOUS (AV) GORE-TEX GRAFT ARM;  Surgeon: Maeola Harman, MD;  Location: [facility] OR;  Service: Vascular;  Laterality: Left;   CARDIAC CATHETERIZATION N/A 08/15/2016    Procedure: Left Heart Cath and Coronary Angiography;  Surgeon: Alwyn Pea, MD;  Location: [facility] INVASIVE CV LAB;  Service: Cardiovascular;  Laterality: N/A;   CARDIAC CATHETERIZATION N/A 08/15/2016   Procedure: Coronary Stent Intervention;  Surgeon: Alwyn Pea, MD;  Location: [facility] INVASIVE CV LAB;  Service: Cardiovascular;  Laterality: N/A;   CARDIAC CATHETERIZATION N/A 08/18/2016   Procedure: Right Heart Cath;  Surgeon: Dolores Patty, MD;  Location: [facility] INVASIVE CV LAB;  Service: Cardiovascular;  Laterality: N/A;   CARDIAC CATHETERIZATION N/A 08/18/2016   Procedure: IABP Insertion;  Surgeon: Dolores Patty, MD;  Location: [facility] INVASIVE CV LAB;  Service: Cardiovascular;  Laterality: N/A;   CARDIAC CATHETERIZATION Right 08/23/2016   Procedure: CENTRAL LINE INSERTION RIGHT SUBCLAVIAN;  Surgeon: Kerin Perna, MD;  Location: [facility] OR;  Service: Open Heart Surgery;  Laterality: Right;   ENDOVEIN HARVEST OF GREATER SAPHENOUS VEIN Left 08/23/2016   Procedure: ENDOVEIN HARVEST OF GREATER SAPHENOUS VEIN;  Surgeon: Kerin Perna, MD;  Location: [facility] OR;  Service: Open Heart Surgery;  Laterality: Left;   INSERTION OF DIALYSIS CATHETER Bilateral 08/31/2016   Procedure: INSERTION OF DIALYSIS CATHETER LEFT INTERNAL JUGULAR VEIN & INSERTION OF TRIPLE LUMEN RIGHT INTERNAL JUGULAR VEIN;  Surgeon: Chuck Hint, MD;  Location: [facility] OR;  Service: Vascular;  Laterality: Bilateral;   INTRAOPERATIVE TRANSESOPHAGEAL ECHOCARDIOGRAM N/A 08/23/2016   Procedure: INTRAOPERATIVE TRANSESOPHAGEAL ECHOCARDIOGRAM;  Surgeon: Kerin Perna, MD;  Location: [facility] OR;  Service: Open Heart Surgery;  Laterality: N/A;   IR GENERIC HISTORICAL  11/13/2016   IR US GUIDE VASC ACCESS LEFT 11/13/2016 Gilmer Mor, DO [facility]   IR GENERIC HISTORICAL  11/13/2016   IR FLUORO GUIDE CV LINE LEFT 11/13/2016 Gilmer Mor, DO [facility]   IR GENERIC HISTORICAL  11/13/2016   IR GASTROSTOMY TUBE MOD SED 11/13/2016 Gilmer Mor, DO [facility]   MITRAL VALVE REPAIR N/A 08/23/2016   Procedure: MITRAL VALVE REPAIR (MVR) USING EDWARDS MAGNA EASE BIOPROSTHESIS MITRAL  VALVE;  Surgeon: Kerin Perna, MD;  Location: [facility] OR;  Service: Open Heart Surgery;  Laterality: N/A;   tracheostomy reversal     TRACHEOSTOMY TUBE PLACEMENT N/A 11/09/2016   Procedure: TRACHEOSTOMY;  Surgeon: Suzanna Obey, MD;  Location: [facility] OR;  Service: ENT;  Laterality: N/A;   VAGINAL DELIVERY     x 6    Social History   Socioeconomic History   Marital status: Widowed    Spouse name: Not on file   Number of children: 6   Years of education: Not on file   Highest education level: Not on file  Occupational History   Not on file  Tobacco Use   Smoking status: Never   Smokeless tobacco: Never  Substance and Sexual Activity   Alcohol use: No   Drug use: No   Sexual activity: Not on file  Other Topics Concern   Not on file  Social History Narrative   Lives in [place]. Has 2 daughtesr, 3 grandkids.   Social Determinants of Health   Financial Resource Strain: Not on file  Food Insecurity: No Food Insecurity (04/24/2023)   Hunger Vital Sign    Worried About Running Out of Food in the Last Year: Never true    Ran Out of Food in the  Last Year: Never true  Transportation Needs: No Transportation Needs (04/24/2023)   PRAPARE - Administrator, Civil Service (Medical): No    Lack of Transportation (Non-Medical): No  Physical Activity: Not on file  Stress: Not on file  Social Connections: Not on file  Intimate Partner Violence: Patient Declined (04/24/2023)   Humiliation, Afraid, Rape, and Kick questionnaire    Fear of Current or Ex-Partner: Patient declined    Emotionally Abused: Patient declined    Physically Abused: Patient declined    Sexually Abused: Patient declined    Family History  Problem Relation Age of Onset   Hypertension Mother    Diabetes Mother    Breast cancer Sister    Hypertension Father    No  Known Allergies I? Current Facility-Administered Medications  Medication Dose Route Frequency Provider Last Rate Last Admin   acetaminophen (TYLENOL) tablet 650 mg  650 mg Oral Q6H PRN Carollee Herter, DO       Or   acetaminophen (TYLENOL) suppository 650 mg  650 mg Rectal Q6H PRN Carollee Herter, DO       alteplase (CATHFLO ACTIVASE) injection 2 mg  2 mg Intracatheter Once PRN Wendee Beavers, NP       amLODipine (NORVASC) tablet 10 mg  10 mg Oral Daily Carollee Herter, DO   10 mg at 04/24/23 1023   anticoagulant sodium citrate solution 5 mL  5 mL Intracatheter PRN Wendee Beavers, NP       apixaban Everlene Balls) tablet 2.5 mg  2.5 mg Oral BID Carollee Herter, DO   2.5 mg at 04/24/23 1024   aspirin EC tablet 81 mg  81 mg Oral Daily Carollee Herter, DO   81 mg at 04/24/23 1023   atorvastatin (LIPITOR) tablet 80 mg  80 mg Oral q1800 Carollee Herter, DO   80 mg at 04/24/23 1722   carvedilol (COREG) tablet 25 mg  25 mg Oral BID Carollee Herter, DO   25 mg at 04/24/23 1023   ceFEPIme (MAXIPIME) 1 g in sodium chloride 0.9 % 100 mL IVPB  1 g Intravenous Q24H Sharen Hones, [facility]   Stopped at 04/23/23 2233   Chlorhexidine Gluconate Cloth 2 % PADS 6 each  6 each Topical Q0600 Carollee Herter, DO       [START ON 04/25/2023] epoetin alfa (EPOGEN) injection 10,000 Units  10,000 Units Intravenous Q M,W,F-HD Wendee Beavers, NP       escitalopram (LEXAPRO) tablet 10 mg  10 mg Oral Daily Carollee Herter, DO   10 mg at 04/24/23 1022   gabapentin (NEURONTIN) capsule 300 mg  300 mg Oral QHS Carollee Herter, DO   300 mg at 04/23/23 2346   guaiFENesin-dextromethorphan (ROBITUSSIN DM) 100-10 MG/5ML syrup 15 mL  15 mL Oral Q4H PRN Carollee Herter, DO       heparin injection 1,000 Units  1,000 Units Intracatheter PRN Wendee Beavers, NP       insulin aspart (novoLOG) injection 0-5 Units  0-5 Units Subcutaneous QHS Carollee Herter, DO       insulin aspart (novoLOG) injection 0-6 Units  0-6 Units Subcutaneous TID WC Carollee Herter, DO   1 Units at 04/24/23 1127   insulin  detemir (LEVEMIR) injection 4 Units  4 Units Subcutaneous BID Carollee Herter, DO   4 Units at 04/24/23 1028   ipratropium-albuterol (DUONEB) 0.5-2.5 (3) MG/3ML nebulizer solution 3 mL  3 mL Nebulization Q6H Carollee Herter, DO   3 mL at 04/24/23 1326   irbesartan (AVAPRO) tablet  150 mg  150 mg Oral Daily Carollee Herter, DO   150 mg at 04/24/23 1024   lidocaine (PF) (XYLOCAINE) 1 % injection 5 mL  5 mL Intradermal PRN Wendee Beavers, NP       lidocaine-prilocaine (EMLA) cream 1 Application  1 Application Topical PRN Wendee Beavers, NP       [START ON 04/25/2023] midodrine (PROAMATINE) tablet 5 mg  5 mg Oral Once per day on Mon Wed Fri Carollee Herter, DO       ondansetron [facility]) tablet 4 mg  4 mg Oral Q6H PRN Carollee Herter, DO   4 mg at 04/23/23 2346   Or   ondansetron (ZOFRAN) injection 4 mg  4 mg Intravenous Q6H PRN Carollee Herter, DO       pantoprazole (PROTONIX) EC tablet 40 mg  40 mg Oral Daily Carollee Herter, DO   40 mg at 04/24/23 1023   pentafluoroprop-tetrafluoroeth (GEBAUERS) aerosol 1 Application  1 Application Topical PRN Wendee Beavers, NP       sevelamer carbonate (RENVELA) tablet 1,600 mg  1,600 mg Oral TID WC Carollee Herter, DO   1,600 mg at 04/24/23 1127   [START ON 04/25/2023] traZODone (DESYREL) tablet 50 mg  50 mg Oral Once per day on Mon Wed Fri Chen, Minerva Areola, DO         Abtx:  Anti-infectives (From admission, onward)    Start     Dose/Rate Route Frequency Ordered Stop   04/23/23 2200  ceFEPIme (MAXIPIME) 1 g in sodium chloride 0.9 % 100 mL IVPB        1 g 200 mL/hr over 30 Minutes Intravenous Every 24 hours 04/23/23 2123     04/23/23 2015  ceFEPIme (MAXIPIME) 2 g in sodium chloride 0.9 % 100 mL IVPB  Status:  Discontinued        2 g 200 mL/hr over 30 Minutes Intravenous  Once 04/23/23 2012 04/23/23 2123   04/23/23 2015  azithromycin (ZITHROMAX) 500 mg in sodium chloride 0.9 % 250 mL IVPB        500 mg 250 mL/hr over 60 Minutes Intravenous  Once 04/23/23 2012 04/24/23 0327       REVIEW OF  SYSTEMS:  Const: negative fever, negative chills, negative weight loss Eyes: negative diplopia or visual changes, negative eye pain ENT: negative coryza, negative sore throat Resp:  cough, , dyspnea Cards: negative for chest pain, palpitations, lower extremity edema GU: negative for frequency, dysuria and hematuria GI: Negative for abdominal pain, diarrhea, bleeding, constipation Skin: negative for rash and pruritus Heme: negative for easy bruising and gum/nose bleeding MS: weakness Neurolo:negative for headaches, dizziness, vertigo, memory problems  Psych: negative for feelings of anxiety, depression  Endocrine:  diabetes Allergy/Immunology- negative for any medication or food allergies  Objective:  VITALS:  BP 131/64 (BP Location: Right Arm)   Pulse 72   Temp 99 F (37.2 C) (Oral)   Resp 20   Ht 5\' 3"  (1.6 m)   Wt 68.2 kg   SpO2 94%   BMI 26.64 kg/m   PHYSICAL EXAM:  General: Alert, cooperative, no distress, appears stated age.  Head: Normocephalic, without obvious abnormality, atraumatic. Eyes: Conjunctivae clear, anicteric sclerae. Pupils are equal ENT tracheostomy Back: No CVA tenderness. Lungs: b/l air entry- few basal crepts Heart: tachycardia Abdomen: Soft, non-tender,not distended. Bowel sounds normal. No masses Extremities: atraumatic, no cyanosis. No edema. No clubbing Skin: No rashes or lesions. Or bruising Lymph: Cervical, supraclavicular normal. Neurologic: Grossly non-focal Pertinent Labs Lab Results  CBC    Component Value Date/Time   WBC 12.1 (H) 04/24/2023 0443   RBC 2.58 (L) 04/24/2023 0443   HGB 8.0 (L) 04/24/2023 0443   HCT 23.6 (L) 04/24/2023 0443   PLT 193 04/24/2023 0443   MCV 91.5 04/24/2023 0443   MCH 31.0 04/24/2023 0443   MCHC 33.9 04/24/2023 0443   RDW 15.4 04/24/2023 0443   LYMPHSABS 0.5 (L) 04/24/2023 0443   MONOABS 0.9 04/24/2023 0443   EOSABS 0.2 04/24/2023 0443   BASOSABS 0.1 04/24/2023 0443       Latest Ref Rng & Units  04/24/2023    4:43 AM 04/23/2023    9:30 AM 04/20/2023    4:56 AM  CMP  Glucose 70 - 99 mg/dL 782  956  213   BUN 8 - 23 mg/dL 31  57  33   Creatinine 0.44 - 1.00 mg/dL 0.86  5.78  4.69   Sodium 135 - 145 mmol/L 136  139  137   Potassium 3.5 - 5.1 mmol/L 4.4  4.4  4.4   Chloride 98 - 111 mmol/L 98  102  98   CO2 22 - 32 mmol/L 29  24  29    Calcium 8.9 - 10.3 mg/dL 8.4  8.8  8.1   Total Protein 6.5 - 8.1 g/dL 6.2     Total Bilirubin 0.3 - 1.2 mg/dL 1.5     Alkaline Phos 38 - 126 U/L 65     AST 15 - 41 U/L 17     ALT 0 - 44 U/L 21         Microbiology: Recent Results (from the past 240 hour(s))  SARS Coronavirus 2 by RT PCR (hospital order, performed in [facility] hospital lab) *cepheid single result test* Anterior Nasal Swab     Status: None   Collection Time: 04/18/23  7:34 PM   Specimen: Anterior Nasal Swab  Result Value Ref Range Status   SARS Coronavirus 2 by RT PCR NEGATIVE NEGATIVE Final    Comment: (NOTE) SARS-CoV-2 target nucleic acids are NOT DETECTED.  The SARS-CoV-2 RNA is generally detectable in upper and lower respiratory specimens during the acute phase of infection. The lowest concentration of SARS-CoV-2 viral copies this assay can detect is 250 copies / mL. A negative result does not preclude SARS-CoV-2 infection and should not be used as the sole basis for treatment or other patient management decisions.  A negative result may occur with improper specimen collection / handling, submission of specimen other than nasopharyngeal swab, presence of viral mutation(s) within the areas targeted by this assay, and inadequate number of viral copies (<250 copies / mL). A negative result must be combined with clinical observations, patient history, and epidemiological information.  Fact Sheet for Patients:   RoadLapTop.co.za  Fact Sheet for Healthcare Providers: http://kim-miller.com/  This test is not yet approved or   cleared by the [place] FDA and has been authorized for detection and/or diagnosis of SARS-CoV-2 by FDA under an Emergency Use Authorization (EUA).  This EUA will remain in effect (meaning this test can be used) for the duration of the COVID-19 declaration under Section 564(b)(1) of the Act, 21 U.S.C. section 360bbb-3(b)(1), unless the authorization is terminated or revoked sooner.  Performed at [facility], [address] [place], [place] [address]   Blood culture (routine x 2)     Status: None (Preliminary result)   Collection Time: 04/18/23  8:20 PM   Specimen: BLOOD  Result Value Ref Range  Status   Specimen Description   Final    BLOOD RIGHT ANTECUBITAL Performed at [facility], [address] [place], [place] [address]    Special Requests   Final    BOTTLES DRAWN AEROBIC AND ANAEROBIC Blood Culture adequate volume Performed at [facility], [address] [place], [place] [address]    Culture  Setup Time   Final    GRAM POSITIVE RODS AEROBIC BOTTLE ONLY CRITICAL RESULT CALLED TO, READ BACK BY AND VERIFIED WITH: NATHAN BELUE AT 0981 04/21/23.PMF Performed at [facility], [address], [place], [place] [address]    Culture   Final    CULTURE REINCUBATED FOR BETTER GROWTH Performed at [facility], [address] [place], [place] [address]    Report Status PENDING  Incomplete  Blood culture (routine x 2)     Status: None   Collection Time: 04/18/23  8:51 PM   Specimen: BLOOD  Result Value Ref Range Status   Specimen Description BLOOD BLOOD RIGHT HAND  Final   Special Requests   Final    BOTTLES DRAWN AEROBIC AND ANAEROBIC Blood Culture adequate volume   Culture   Final    NO GROWTH 5 DAYS Performed at [facility], [address] [place], [place] [address]    Report Status 04/23/2023 FINAL  Final  Culture, Respiratory w Gram Stain     Status: None   Collection Time: 04/18/23 10:20 PM   Specimen:  Tracheal Aspirate; Respiratory  Result Value Ref Range Status   Specimen Description   Final    TRACHEAL ASPIRATE Performed at [facility], [address] [place], [place] [address]    Special Requests   Final    Normal Performed at [facility], [address] [place], [place] [address]    Gram Stain   Final    RARE WBC PRESENT, PREDOMINANTLY PMN FEW GRAM POSITIVE RODS FEW GRAM NEGATIVE RODS RARE GRAM POSITIVE COCCI    Culture   Final    ABUNDANT PSEUDOMONAS AERUGINOSA ABUNDANT DIPHTHEROIDS(CORYNEBACTERIUM SPECIES) Standardized susceptibility testing for this organism is not available. Performed at [facility], [address], [place], [place] [address]    Report Status 04/23/2023 FINAL  Final   Organism ID, Bacteria PSEUDOMONAS AERUGINOSA  Final      Susceptibility   Pseudomonas aeruginosa - MIC*    CEFTAZIDIME 4 SENSITIVE Sensitive     CIPROFLOXACIN 0.5 SENSITIVE Sensitive     GENTAMICIN 2 SENSITIVE Sensitive     IMIPENEM 1 SENSITIVE Sensitive     PIP/TAZO 8 SENSITIVE Sensitive     CEFEPIME 2 SENSITIVE Sensitive     * ABUNDANT PSEUDOMONAS AERUGINOSA  SARS Coronavirus 2 by RT PCR (hospital order, performed in [facility] hospital lab) *cepheid single result test* Anterior Nasal Swab     Status: None   Collection Time: 04/23/23  9:30 AM   Specimen: Anterior Nasal Swab  Result Value Ref Range Status   SARS Coronavirus 2 by RT PCR NEGATIVE NEGATIVE Final    Comment: (NOTE) SARS-CoV-2 target nucleic acids are NOT DETECTED.  The SARS-CoV-2 RNA is generally detectable in upper and lower respiratory specimens during the acute phase of infection. The lowest concentration of SARS-CoV-2 viral copies this assay can detect is 250 copies / mL. A negative result does not preclude SARS-CoV-2 infection and should not be used as the sole basis for treatment or other patient management decisions.  A negative result may occur  with improper specimen  collection / handling, submission of specimen other than nasopharyngeal swab, presence of viral mutation(s) within the areas targeted by this assay, and inadequate number of viral copies (<250 copies / mL). A negative result must be combined with clinical observations, patient history, and epidemiological information.  Fact Sheet for Patients:   RoadLapTop.co.za  Fact Sheet for Healthcare Providers: http://kim-miller.com/  This test is not yet approved or  cleared by the [place] FDA and has been authorized for detection and/or diagnosis of SARS-CoV-2 by FDA under an Emergency Use Authorization (EUA).  This EUA will remain in effect (meaning this test can be used) for the duration of the COVID-19 declaration under Section 564(b)(1) of the Act, 21 U.S.C. section 360bbb-3(b)(1), unless the authorization is terminated or revoked sooner.  Performed at [facility], [address] [place], [place] [address]     IMAGING RESULTS:  B/l pulmonary edema I have personally reviewed the films ? Impression/Recommendation ? Acute hypoxic resp failure Pulmonary edema/CHF Because of MVR will get 2 d echo to assess valve Pseudomonas in -tracheal aspirate, because of SOB, recurrent hospitalization  - will treat as pseudomonas resp infection with cefepime for 10 days- can be given during dialysis Will check resp panel PCR to look for any concomitant viral illness like RSV  ESRD on dialysis  Tracheostomy since 2017  MVR- prosthetic valve- 2 d echo to evaluate the functioning of the valve  Recent bc 1 of 4 gram positive rod- final ID still pending Will repeat Blood culture ? CAD? ___________________________________________________ Discussed with patient, and her son Note:  This document was prepared using Conservation officer, historic buildings and may include unintentional dictation errors.

## 2023-04-24 NOTE — ED Notes (Signed)
This RN called hospitalist in regards to pt oxygen. RT on the way.

## 2023-04-24 NOTE — Progress Notes (Signed)
[facility]  ROUNDING NOTE   Subjective:   Ms. ANITA BALBO returns to [facility] on 04/23/2023 for Acute on chronic respiratory failure with hypoxia ([name]) [J96.21] Acute on chronic diastolic (congestive) heart failure ([name]) [I50.33]  Patient is known to our practice from previous admissions and receives outpatient dialysis at [facility] on a MWF schedule, supervised by [facility] physicians.  Dialysis tolerated fair yesterday, patient began to drop her O2 sats during dialysis. Nursing notes overnight indicate patient hypoxic with O2 sats 82 to 83% overnight.  Patient states they improved after receiving breathing treatments from RT. Currently seen sitting up on stretcher in ED, 10 L trach collar   Objective:  Vital signs in last 24 hours:  Temp:  [98.2 F (36.8 C)-100.9 F (38.3 C)] 98.3 F (36.8 C) (05/21 1358) Pulse Rate:  [73-111] 78 (05/21 1300) Resp:  [7-32] 19 (05/21 1300) BP: (110-185)/(58-109) 121/61 (05/21 1300) SpO2:  [82 %-100 %] 92 % (05/21 1300) FiO2 (%):  [30 %-50 %] 50 % (05/21 1332) Weight:  [68.6 kg] 68.6 kg (05/21 0934)  Weight change:  Filed Weights   04/24/23 0934  Weight: 68.6 kg    Intake/Output: I/O last 3 completed shifts: In: 250 [IV Piggyback:250] Out: 1300 [Other:1300]   Intake/Output this shift:  No intake/output data recorded.  Physical Exam: General: NAD, sitting up in bed  Head: Normocephalic, atraumatic. Moist oral mucosal membranes  Eyes: Anicteric  Neck: +tracheostomy  Lungs:  Rhonchi bilaterally   Heart: Regular rate and rhythm  Abdomen:  Soft, nontender  Extremities:  no peripheral edema.  Neurologic: Nonfocal, moving all four extremities  Skin: No lesions  Access: Left AVG    Basic Metabolic Panel: Recent Labs  Lab 04/18/23 1527 04/18/23 1958 04/19/23 0524 04/20/23 0456 04/23/23 0930 04/24/23 0443  NA 139  --  140 137 139 136  K 3.0*  --  3.7 4.4 4.4 4.4  CL 95*  --  97* 98 102 98  CO2 28  --  29 29  24 29   GLUCOSE 101*  --  152* 110* 135* 148*  BUN 34*  --  46* 33* 57* 31*  CREATININE 5.25*  --  7.59* 5.49* 9.19* 5.95*  CALCIUM 8.6*  --  8.0* 8.1* 8.8* 8.4*  MG  --  1.8  --   --   --   --      Liver Function Tests: Recent Labs  Lab 04/24/23 0443  AST 17  ALT 21  ALKPHOS 65  BILITOT 1.5*  PROT 6.2*  ALBUMIN 3.2*   No results for input(s): "LIPASE", "AMYLASE" in the last 168 hours. No results for input(s): "AMMONIA" in the last 168 hours.  CBC: Recent Labs  Lab 04/18/23 1527 04/19/23 0524 04/20/23 0456 04/23/23 0930 04/24/23 0443  WBC 10.9* 7.6 7.4 12.2* 12.1*  NEUTROABS  --   --   --  9.8* 10.4*  HGB 9.6* 8.6* 8.3* 8.4* 8.0*  HCT 27.9* 25.3* 24.7* 25.3* 23.6*  MCV 88.0 89.4 90.8 92.3 91.5  PLT 187 156 162 179 193     Cardiac Enzymes: No results for input(s): "CKTOTAL", "CKMB", "CKMBINDEX", "TROPONINI" in the last 168 hours.  BNP: Invalid input(s): "POCBNP"  CBG: Recent Labs  Lab 04/22/23 0827 04/22/23 1134 04/23/23 2329 04/24/23 0729 04/24/23 1119  GLUCAP 107* 189* 153* 104* 180*     Microbiology: Results for orders placed or performed during the hospital encounter of 04/23/23  SARS Coronavirus 2 by RT PCR (hospital order, performed in  [facility] hospital lab) *cepheid single result test* Anterior Nasal Swab     Status: None   Collection Time: 04/23/23  9:30 AM   Specimen: Anterior Nasal Swab  Result Value Ref Range Status   SARS Coronavirus 2 by RT PCR NEGATIVE NEGATIVE Final    Comment: (NOTE) SARS-CoV-2 target nucleic acids are NOT DETECTED.  The SARS-CoV-2 RNA is generally detectable in upper and lower respiratory specimens during the acute phase of infection. The lowest concentration of SARS-CoV-2 viral copies this assay can detect is 250 copies / mL. A negative result does not preclude SARS-CoV-2 infection and should not be used as the sole basis for treatment or other patient management decisions.  A negative result may occur  with improper specimen collection / handling, submission of specimen other than nasopharyngeal swab, presence of viral mutation(s) within the areas targeted by this assay, and inadequate number of viral copies (<250 copies / mL). A negative result must be combined with clinical observations, patient history, and epidemiological information.  Fact Sheet for Patients:   RoadLapTop.co.za  Fact Sheet for Healthcare Providers: http://kim-miller.com/  This test is not yet approved or  cleared by the [place] FDA and has been authorized for detection and/or diagnosis of SARS-CoV-2 by FDA under an Emergency Use Authorization (EUA).  This EUA will remain in effect (meaning this test can be used) for the duration of the COVID-19 declaration under Section 564(b)(1) of the Act, 21 U.S.C. section 360bbb-3(b)(1), unless the authorization is terminated or revoked sooner.  Performed at [facility], [address] [place], [place] [address]     Coagulation Studies: No results for input(s): "LABPROT", "INR" in the last 72 hours.   Urinalysis: No results for input(s): "COLORURINE", "LABSPEC", "PHURINE", "GLUCOSEU", "HGBUR", "BILIRUBINUR", "KETONESUR", "PROTEINUR", "UROBILINOGEN", "NITRITE", "LEUKOCYTESUR" in the last 72 hours.  Invalid input(s): "APPERANCEUR"    Imaging: CT CHEST WO CONTRAST  Result Date: 04/23/2023 CLINICAL DATA:  Respiratory illness, nondiagnostic xray, dyspnea EXAM: CT CHEST WITHOUT CONTRAST TECHNIQUE: Multidetector CT imaging of the chest was performed following the standard protocol without IV contrast. RADIATION DOSE REDUCTION: This exam was performed according to the departmental dose-optimization program which includes automated exposure control, adjustment of the mA and/or kV according to patient size and/or use of iterative reconstruction technique. COMPARISON:  04/18/2023 FINDINGS: Cardiovascular: Moderate  multi-vessel coronary artery calcification. Mitral valve replacement has been performed. Mild global cardiomegaly. No pericardial effusion. Central pulmonary arteries are enlarged in keeping with changes of pulmonary arterial hypertension. Mild atherosclerotic calcification within the thoracic aorta. No aortic aneurysm. Mediastinum/Nodes: Tracheostomy in expected position. Shotty right paratracheal, aortopulmonary, and subcarinal adenopathy is nonspecific, possibly reactive in nature. This appears progressive since prior examination. Visualized thyroid is unremarkable. The esophagus is unremarkable. Lungs/Pleura: Small bilateral pleural effusions are present, enlarged since prior examination, with increasing associated bibasilar compressive atelectasis. Interval development of mild interstitial and alveolar pulmonary edema with smooth interlobular septal thickening and diffuse ground-glass infiltrate. Increasing superimposed atelectasis within the left upper lobe. No pneumothorax. No central obstructing lesion. Upper Abdomen: No acute abnormality. Musculoskeletal: No acute bone abnormality. No lytic or blastic bone lesion. IMPRESSION: 1. Interval development of mild interstitial and alveolar pulmonary edema, small but enlarging bilateral pleural effusions, and mild global cardiomegaly in keeping with mild congestive heart failure. 2. Moderate multi-vessel coronary artery calcification. 3. Morphologic changes in keeping with pulmonary arterial hypertension. 4. Shotty mediastinal adenopathy, progressive since prior examination, possibly reactive in nature. Aortic Atherosclerosis (ICD10-I70.0). Electronically Signed   By: Gloris Ham  Ramiro Harvest M.D.   On: 04/23/2023 20:55   DG Chest Portable 1 View  Result Date: 04/23/2023 CLINICAL DATA:  67 year old female with shortness of breath. Recent hospitalization. EXAM: PORTABLE CHEST 1 VIEW COMPARISON:  Chest CT 04/18/2023. FINDINGS: Portable AP upright view at 0927 hours. Stable  tracheostomy. Cardiac prosthetic valve and cardiomegaly. Stable cardiac size and mediastinal contours. Increasing bilateral lung base veiling opacity typical of a fusions, lower lung vascular congestion. Pleural effusion volume still probably small. Upper lung vascular congestion appears mildly improved from the previous chest x-ray. No pneumothorax. No definite air bronchograms. No acute osseous abnormality identified. Negative visible bowel gas. IMPRESSION: 1. Cardiomegaly with small bilateral pleural effusions and lung base atelectasis. But otherwise pulmonary vascular congestion appears improved since 04/18/2023. 2. Stable tracheostomy. Electronically Signed   By: Odessa Fleming M.D.   On: 04/23/2023 09:38     Medications:    anticoagulant sodium citrate     ceFEPime (MAXIPIME) IV Stopped (04/23/23 2233)     amLODipine  10 mg Oral Daily   apixaban  2.5 mg Oral BID   aspirin EC  81 mg Oral Daily   atorvastatin  80 mg Oral q1800   carvedilol  25 mg Oral BID   Chlorhexidine Gluconate Cloth  6 each Topical Q0600   escitalopram  10 mg Oral Daily   gabapentin  300 mg Oral QHS   insulin aspart  0-5 Units Subcutaneous QHS   insulin aspart  0-6 Units Subcutaneous TID WC   insulin detemir  4 Units Subcutaneous BID   ipratropium-albuterol  3 mL Nebulization Q6H   irbesartan  150 mg Oral Daily   [START ON 04/25/2023] midodrine  5 mg Oral Once per day on Mon Wed Fri   pantoprazole  40 mg Oral Daily   sevelamer carbonate  1,600 mg Oral TID WC   [START ON 04/25/2023] traZODone  50 mg Oral Once per day on Mon Wed Fri   acetaminophen **OR** acetaminophen, alteplase, anticoagulant sodium citrate, guaiFENesin-dextromethorphan, heparin, lidocaine (PF), lidocaine-prilocaine, ondansetron **OR** ondansetron (ZOFRAN) IV, pentafluoroprop-tetrafluoroeth  Assessment/ Plan:  Ms. LAURETHA STEINMAN is a 67 y.o.  female with end stage renal disease on hemodialysis, tracheostomy, hypertension, hyperlipidemia, diabetes mellitus  type II insulin dependent, depression, and diabetic neuropathy who [facility]2023 for Acute on chronic respiratory failure with hypoxia ([name]) [J96.21] Acute on chronic diastolic (congestive) heart failure ([name]) [I50.33]  [facility] MWF [address] Left AVG 62kg  End Stage Kidney Disease:  -Dialysis received yesterday, UF 1.3 L achieved. - Next treatment scheduled for Wednesday  Hypertension with chronic kidney disease: current regimen of irbesartan, carvedilol and amlodipine. Home regimen of olmesartan instead of irbesartan. Also prescribed Midodrine to prevent hypotension during dialysis.  Blood pressure stable, 121/61.   Diabetes mellitus type II with chronic kidney disease: insulin dependent.  Primary team to manage sliding scale insulin.  Secondary Hyperparathyroidism  - Calcium acceptable.  - Continue sevelamer with meals  Anemia of chronic kidney disease: hemoglobin 8.0 - EPO prescribed with dialysis   LOS: 0   5/21/20242:54 PM

## 2023-04-24 NOTE — Evaluation (Signed)
Clinical/Bedside Swallow Evaluation Patient Details  Name: Deborah [name] MRN: [id number] Date of Birth: 1956/11/21  Today's Date: 04/24/2023 Time: SLP Start Time (ACUTE ONLY): 1150 SLP Stop Time (ACUTE ONLY): 1235 SLP Time Calculation (min) (ACUTE ONLY): 45 min  Past Medical History:  Past Medical History:  Diagnosis Date   Acute on chronic diastolic CHF (congestive heart failure) ([name]) 01/04/2022   Atrial fibrillation ([name])    Atrial flutter ([name])    Cardiogenic shock ([name]) 08/17/2016   Diabetes mellitus    Diabetes mellitus type 2, controlled ([name]) 03/08/2012   ESRD (end stage renal disease) ([name])    Heart attack ([name])    Hyperlipidemia    Hypertension    MI, old    NSTEMI (non-ST elevated myocardial infarction) ([name])    Renal disorder    dialysls   Respiratory failure ([name]) 08/17/2016   ST elevation myocardial infarction involving left main coronary artery ([name])    STEMI (ST elevation myocardial infarction) ([name]) 08/15/2016   Type 2 diabetes mellitus with complication, with long-term current use of insulin ([name])    Past Surgical History:  Past Surgical History:  Procedure Laterality Date   A/V FISTULAGRAM N/A 10/05/2017   Procedure: A/V Fistulagram;  Surgeon: Renford Dills, MD;  Location: [facility] INVASIVE CV LAB;  Service: Cardiovascular;  Laterality: N/A;   A/V SHUNTOGRAM N/A 10/05/2017   Procedure: A/V SHUNTOGRAM;  Surgeon: Renford Dills, MD;  Location: [facility] INVASIVE CV LAB;  Service: Cardiovascular;  Laterality: N/A;   AV FISTULA PLACEMENT Left 10/02/2016   Procedure: INSERTION OF ARTERIOVENOUS (AV) GORE-TEX GRAFT ARM;  Surgeon: Maeola Harman, MD;  Location: [facility] OR;  Service: Vascular;  Laterality: Left;   CARDIAC CATHETERIZATION N/A 08/15/2016   Procedure: Left Heart Cath and Coronary Angiography;  Surgeon: Alwyn Pea, MD;  Location: [facility] INVASIVE CV LAB;  Service: Cardiovascular;  Laterality: N/A;   CARDIAC CATHETERIZATION N/A 08/15/2016    Procedure: Coronary Stent Intervention;  Surgeon: Alwyn Pea, MD;  Location: [facility] INVASIVE CV LAB;  Service: Cardiovascular;  Laterality: N/A;   CARDIAC CATHETERIZATION N/A 08/18/2016   Procedure: Right Heart Cath;  Surgeon: Dolores Patty, MD;  Location: [facility] INVASIVE CV LAB;  Service: Cardiovascular;  Laterality: N/A;   CARDIAC CATHETERIZATION N/A 08/18/2016   Procedure: IABP Insertion;  Surgeon: Dolores Patty, MD;  Location: [facility] INVASIVE CV LAB;  Service: Cardiovascular;  Laterality: N/A;   CARDIAC CATHETERIZATION Right 08/23/2016   Procedure: CENTRAL LINE INSERTION RIGHT SUBCLAVIAN;  Surgeon: Kerin Perna, MD;  Location: [facility] OR;  Service: Open Heart Surgery;  Laterality: Right;   ENDOVEIN HARVEST OF GREATER SAPHENOUS VEIN Left 08/23/2016   Procedure: ENDOVEIN HARVEST OF GREATER SAPHENOUS VEIN;  Surgeon: Kerin Perna, MD;  Location: [facility] OR;  Service: Open Heart Surgery;  Laterality: Left;   INSERTION OF DIALYSIS CATHETER Bilateral 08/31/2016   Procedure: INSERTION OF DIALYSIS CATHETER LEFT INTERNAL JUGULAR VEIN & INSERTION OF TRIPLE LUMEN RIGHT INTERNAL JUGULAR VEIN;  Surgeon: Chuck Hint, MD;  Location: [facility] OR;  Service: Vascular;  Laterality: Bilateral;   INTRAOPERATIVE TRANSESOPHAGEAL ECHOCARDIOGRAM N/A 08/23/2016   Procedure: INTRAOPERATIVE TRANSESOPHAGEAL ECHOCARDIOGRAM;  Surgeon: Kerin Perna, MD;  Location: [facility] OR;  Service: Open Heart Surgery;  Laterality: N/A;   IR GENERIC HISTORICAL  11/13/2016   IR US GUIDE VASC ACCESS LEFT 11/13/2016 Gilmer Mor, DO [facility]   IR GENERIC HISTORICAL  11/13/2016   IR FLUORO GUIDE CV LINE LEFT 11/13/2016 Gilmer Mor, DO [facility]  IR GENERIC HISTORICAL  11/13/2016   IR GASTROSTOMY TUBE MOD SED 11/13/2016 Gilmer Mor, DO [facility]   MITRAL VALVE REPAIR N/A 08/23/2016   Procedure: MITRAL VALVE REPAIR (MVR) USING EDWARDS MAGNA EASE BIOPROSTHESIS MITRAL  VALVE;  Surgeon: Kerin Perna, MD;  Location: [facility] OR;   Service: Open Heart Surgery;  Laterality: N/A;   tracheostomy reversal     TRACHEOSTOMY TUBE PLACEMENT N/A 11/09/2016   Procedure: TRACHEOSTOMY;  Surgeon: Suzanna Obey, MD;  Location: [facility] OR;  Service: ENT;  Laterality: N/A;   VAGINAL DELIVERY     x 6   HPI:  Pt is a 67 y.o. female with history of multilevel airway stenosis and trach dependence. Per chart notes, including Outpt f/u w/ [facility] Otol[facility], she initially received her traceostomy in January 2018 after being transferred from [facility] where she was admitted with respiratory failure requiring prolonged ventilatory support. Her respiratory failure appears to stem from infectious complications during her recovery from her recent cardiac events: STEMI with cardiogenic shock, papillary muscle rupture. During that recovery she developed HAP which required prolonged intubation and subsequently a tracheostomy. She eventually recovered from the HAP, was decannulated and discharged. She had a difficult recovery course in rehab and was re-admitted with dyspnea from her HD center. She was seen by in house resident ENT 12/26/16 with laryngotracheal stenosis.  Surgical history includes: 01/10/17: MDL and bronch- operative findings: 1.  Posterior glottic stenosis.  High tracheostomy site with suprastomal collapse noted.  Distal to stoma site, airway and trachea appears fully patent to carina. Per MD notes, decannulaiton is not possible given her poor healing from prior airway surgerie.  Also per Otolaryngology notes, a 6DCFN (fenestrated trach) was attempted in office so that she could tolerate her PMV. She did tolerate this better with this trach, however she developed some mild sob with prolonged use of PMV. For some reason, she just did not like the fenestrated trach. Additionally, she did not use her PMV much and just used finger occlusion. Per her request, we changed her back to a Shiley 6 DCFS, which she tolerates very well.  Pt endorsed NOT  wanting to wear an inner cannula and to use finger occlusion for speaking during this evaluation today.    PMH and Admit dx includes: end-stage renal disease on dialysis Monday Wednesday Friday, paroxysmal atrial fibrillation on Eliquis, history of chronic tracheostomy, voal cord paralysis, chronic diastolic heart failure, chronic hypoxia at night, uncontrolled type 2 diabetes, hypertension, who presents the ER today with worsening cough and hypoxia -- Acute on chronic respiratory failure with hypoxia;  Acute on chronic diastolic CHF.  Pt has pseudomonas growing out of her trach aspirate culture.  Just d/cd on 04/22/2023 s/p dx'd CAP, fluid overload.   Chest x-ray this admit: significant volume overload -- patient was dialyzed post admission 04/23/2023.  Chest CT: Interval development of mild interstitial and alveolar pulmonary edema, small but enlarging bilateral pleural effusions, and mild global cardiomegaly in keeping with mild congestive heart failure. 2. Moderate multi-vessel coronary artery calcification. 3. Morphologic changes in keeping with pulmonary arterial hypertension. 4. Shotty mediastinal adenopathy, progressive since prior Examination.     Assessment / Plan / Recommendation  Clinical Impression   Pt seen for BSE today. Pt awake, verbal using finger occlusion over trach during speaking -- see HPI note above describing pt's Baseline h/o NOT using a PMV on her trach for speech but instead using finger occlusion d/t NOT wearing an inner cannula and  NOT feeling comfortable w/ her breathing when she has attempted a PMV. Pt is A/O x4. Followed all instructions and fed self w/ setup. She engaged easily and described her speaking and tracheostomy situation w/ this SLP and RT in room. Son present in room.  Abebrile, WBC 12.1 trending down. O2 support at trach: 10L. Noted CXR indicating "fluid overload".   Pt appears to present w/ functional oropharyngeal phase swallowing w/ No oropharyngeal  phase dysphagia noted, No neuromuscular deficits noted. A PMV was not attempted/used during this evaluation -- NOT applicable for pt(see HPI). Pt consumed po trials w/ No immediate, overt clinical s/s of aspiration during po trials.  Pt appears at reduced risk for aspiration following general aspiration precautions. However, pt does have challenging factors that could impact oropharyngeal swallowing to include tracheostomy, unable to wear PMV comfortably(and does not use an inner cannula at baseline), illness/weakness, and hospitalization. These factors can increase risk for dysphagia, aspiration as well as decreased oral intake overall.   During po trials, pt consumed all consistencies w/ no overt coughing, decline in vocal quality during finger occlusion/speech, or change in respiratory presentation during/post trials. O2 sats remained 94-96%, baseline. Oral phase appeared [facility] w/ timely bolus management, mastication, and control of bolus propulsion for A-P transfer for swallowing. Oral clearing achieved w/ all trial consistencies -- moistened, soft foods given.  OM Exam appeared [facility] w/ no unilateral weakness noted. Speech Clear. Pt fed self w/ setup support.   Recommend continue a Regular consistency diet w/ well-Cut meats, moistened foods; Thin liquids -- monitor sip size(small) and drink slowly. Recommend general aspiration precautions, Pills WHOLE in Puree for safer, easier swallowing is recommended for best control during swallowing. Tray setup at meals; sitting fully upright and reducing distractions also recommended. Monitor food consistencies and avoid difficult to eat foods as necessary. Education given on Pills in Puree; food consistencies and easy to eat options/prep; general aspiration precautions to pt and Son. Education given on how when the PMV is used can help normalize pressures during swallowing and more "normalize" swallowing but that pt would have to f/u w/ her Otolaryngologist for  ongoing discussion re: her trach needs/options, including needing to use an inner cannula also. RT present for supportive conversation on such also. NSG to reconsult if any new needs arise. NSG updated, agreed. MD updated. Recommend Dietician f/u for support as needed. SLP Visit Diagnosis: Dysphagia, unspecified (R13.10) (baseline tracheostomy w/ finger occlusion for speech)    Aspiration Risk   (refuced following general aspiration precautions)    Diet Recommendation   continue a Regular consistency diet w/ well-Cut meats, moistened foods; Thin liquids -- monitor sip size(small) and drink slowly. Recommend general aspiration precautions. Tray setup at meals; sitting fully upright and reducing distractions also recommended. Monitor food consistencies and avoid difficult to eat foods as necessary.  Medication Administration: Whole meds with puree (as desired for ease of swallowing Pills)    Other  Recommendations Recommended Consults:  (n/a -- noted ongoing discussions w/ her MDs at [facility] Otolaryngology re: trach/PMV use) Oral Care Recommendations: Oral care BID;Patient independent with oral care (setup)    Recommendations for follow up therapy are one component of a multi-disciplinary discharge planning process, led by the attending physician.  Recommendations may be updated based on patient status, additional functional criteria and insurance authorization.  Follow up Recommendations No SLP follow up      Assistance Recommended at Discharge  PRN  Functional Status Assessment Patient has not had a recent decline  in their functional status  Frequency and Duration  (n/a)   (n/a)       Prognosis Prognosis for improved oropharyngeal function: Good Barriers/Prognosis Comment: baseline tracheostomy w/ finger occlusion for speech -- cannot wear PMV      Swallow Study   General Date of Onset: 04/23/23 HPI: Pt is a 67 y.o. female with history of multilevel airway stenosis and trach dependence.  Per chart notes, including Outpt f/u w/ [facility] Otolaryngology, she initially received her traceostomy in January 2018 after being transferred from [facility] where she was admitted with respiratory failure requiring prolonged ventilatory support. Her respiratory failure appears to stem from infectious complications during her recovery from her recent cardiac events: STEMI with cardiogenic shock, papillary muscle rupture. During that recovery she developed HAP which required prolonged intubation and subsequently a tracheostomy. She eventually recovered from the HAP, was decannulated and discharged. She had a difficult recovery course in rehab and was re-admitted with dyspnea from her HD center. She was seen by in house resident ENT 12/26/16 with laryngotracheal stenosis.      Surgical history with me:   01/10/17: MDL and bronch- operative findings:   1.  Posterior glottic stenosis.  High tracheostomy site with suprastomal collapse noted.  Distal to stoma site, airway and trachea appears fully patent to carina. Per MD notes, decannulaiton is not possible given her poor healing from prior airway surgerie. Also per Otolaryngology notes, a 6DCFN (fenestrated trach) was attempted in office so that she could tolerate her PMV. She did tolerate this better with this trach, however she developed some mild sob with prolonged use of PMV. For some reason she just did not like the fenestrated trach. Additionally, she did not use her PMV much and just used finger occlusion. Per her request, we changed her back to a Shiley 6 DCFS, which she tolerates very well.  Pt endorsed NOT wanting to wear an inner cannula and to use finger occlusion for speaking during this evaluation today.  PMH and Admit dx includes: end-stage renal disease on dialysis Monday Wednesday Friday, paroxysmal atrial fibrillation on Eliquis, history of chronic tracheostomy, voal cord paralysis, chronic diastolic heart failure, chronic hypoxia at night,  uncontrolled type 2 diabetes, hypertension, who presents the ER today with worsening cough and hypoxia -- Acute on chronic respiratory failure with hypoxia;  Acute on chronic diastolic CHF.  Pt has pseudomonas growing out of her trach aspirate culture.  Just d/cd on 04/22/2023 s/p dx'd CAP, fluid overload.   Chest x-ray this admit: significant volume overload -- patient was dialyzed post admission 04/23/2023. Type of Study: Bedside Swallow Evaluation Previous Swallow Assessment: 2021- regular diet Diet Prior to this Study: Regular;Thin liquids (Level 0) Temperature Spikes Noted: No (wbc 12.1 trending down) Respiratory Status: Trach Collar (10L) History of Recent Intubation: No Behavior/Cognition: Alert;Cooperative;Pleasant mood (tracheostomy w/ finger occlusion for speech) Oral Cavity Assessment: Within Functional Limits Oral Care Completed by SLP: Yes Oral Cavity - Dentition: Adequate natural dentition Vision: Functional for self-feeding Self-Feeding Abilities: Able to feed self;Needs set up Patient Positioning: Upright in bed Baseline Vocal Quality: Normal (tracheostomy w/ finger occlusion for speech) Volitional Cough:  (effort impacted by tracheostomy and not wearing PMV nor finger occlusion) Volitional Swallow: Able to elicit    Oral/Motor/Sensory Function Overall Oral Motor/Sensory Function: Within functional limits   Ice Chips Ice chips: Not tested   Thin Liquid Thin Liquid: Within functional limits Presentation: Self Fed;Straw (10+ trials)    Nectar Thick Nectar Thick Liquid: Not  tested   Honey Thick Honey Thick Liquid: Not tested   Puree Puree: Not tested   Solid     Solid: Within functional limits Presentation: Self Fed (8-9 trials) Other Comments: [place] sandwich preferred        Jerilynn Som, MS, CCC-SLP Speech Language Pathologist Rehab Services; [facility] - [place], [place] [phone] (ascom) Donyea Beverlin 04/24/2023,2:56 PM

## 2023-04-24 NOTE — Progress Notes (Signed)
Progress Note   Patient: Deborah [name]:[id number] DOB: [birth year] DOA: 04/23/2023     0 DOS: the patient was seen and examined on 04/24/2023   Brief hospital course: 67 year old African-American female history of end-stage renal disease on dialysis Monday Wednesday Friday, paroxysmal atrial fibrillation on Eliquis, history of tracheostomy, chronic diastolic heart failure, chronic hypoxia at night, uncontrolled type 2 diabetes, hypertension, presents the ER today with worsening cough and hypoxia.  Patient was recently in the hospital and discharged 5/19 after treatment for pneumonia. Chest x-ray showed significant volume overload, patient was dialyzed after admission 5/20.    Principal Problem:   Acute on chronic respiratory failure with hypoxia ([name]) Active Problems:   CAP (community acquired pneumonia)   S/P MVR (mitral valve replacement)   PAF (paroxysmal atrial fibrillation) ([name])   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, W, F   Type 2 diabetes mellitus with chronic kidney disease, with long-term current use of insulin ([name])   Chronic anticoagulation   Tracheostomy status ([name])   Acute on chronic diastolic CHF (congestive heart failure) ([name])   Essential hypertension   Assessment and Plan:  * Acute on chronic respiratory failure with hypoxia ([name]) Acute on chronic diastolic CHF (congestive heart failure) ([name]) Tracheostomy status ([name]) Patient had a noncontrasted CT scan at the time of admission, showed significant vascular congestion.  Patient had a volume overload consistent with acute on chronic diastolic congestive heart failure exacerbation.  Patient was dialyzed yesterday. Another consideration is aspiration.  Patient has a tracheostomy, but is still eating normally.  She has occasional choking.  Will obtain speech therapy evaluation.   CAP (community acquired pneumonia) to Pseudomonas. Pt has pseudomonas grow out of her trach aspirate culture. Pt had been on IV cefepime during  hospital stay. Pseudomonas was sensitive to cefepime.  Pt's family did not pick up levaquin that was prescribed at discharge yesterday.  Patient is a placed back on cefepime after readmission.  Levaquin appears to be appropriate for outpatient treatment after discharge. Sputum culture also growing Corynebacterium species, obtain ID consult to see if this needs to be treated.  Essential hypertension Stable  Type 2 diabetes mellitus with chronic kidney disease, with long-term current use of insulin ([name]) Continue Levemir and sliding scale insulin.  ESRD on dialysis [facility]) - M, W, F Followed by nephrology for dialysis.  PAF (paroxysmal atrial fibrillation) ([name]) Stable. On Eliquis. On coreg  S/P MVR (mitral valve replacement) Stable.      Subjective:  Patient still have a cough, nonproductive.  Short of breath with exertion.  Physical Exam: Vitals:   04/24/23 0723 04/24/23 0800 04/24/23 0830 04/24/23 0934  BP:  (!) 144/70 (!) 133/109   Pulse:  76 88 82  Resp:  18 (!) 24 (!) 26  Temp:    98.2 F (36.8 C)  TempSrc:    Oral  SpO2: 93% 96% 99% 93%  Weight:    68.6 kg   General exam: Appears calm and comfortable  Respiratory system: Clear to auscultation. Respiratory effort normal. Cardiovascular system: S1 & S2 heard, RRR. No JVD, murmurs, rubs, gallops or clicks. No pedal edema. Gastrointestinal system: Abdomen is nondistended, soft and nontender. No organomegaly or masses felt. Normal bowel sounds heard. Central nervous system: Alert and oriented. No focal neurological deficits. Extremities: Symmetric 5 x 5 power. Skin: No rashes, lesions or ulcers Psychiatry: Judgement and insight appear normal. Mood & affect appropriate.    Data Reviewed:  CT scan results, lab results.  Family Communication:  Daughter updated at the bedside.  Disposition: Status is: Observation      Time spent: 35 minutes  Author: Marrion Coy, MD 04/24/2023 11:42 AM  For on call review  [email or web address].

## 2023-04-25 DIAGNOSIS — J988 Other specified respiratory disorders: Secondary | ICD-10-CM | POA: Diagnosis not present

## 2023-04-25 DIAGNOSIS — B965 Pseudomonas (aeruginosa) (mallei) (pseudomallei) as the cause of diseases classified elsewhere: Secondary | ICD-10-CM | POA: Diagnosis not present

## 2023-04-25 DIAGNOSIS — Z7901 Long term (current) use of anticoagulants: Secondary | ICD-10-CM | POA: Diagnosis not present

## 2023-04-25 DIAGNOSIS — Z952 Presence of prosthetic heart valve: Secondary | ICD-10-CM | POA: Diagnosis not present

## 2023-04-25 DIAGNOSIS — I5033 Acute on chronic diastolic (congestive) heart failure: Secondary | ICD-10-CM

## 2023-04-25 DIAGNOSIS — J9621 Acute and chronic respiratory failure with hypoxia: Secondary | ICD-10-CM

## 2023-04-25 DIAGNOSIS — J189 Pneumonia, unspecified organism: Secondary | ICD-10-CM

## 2023-04-25 LAB — CBC
HCT: 22.6 % — ABNORMAL LOW (ref 36.0–46.0)
Hemoglobin: 7.4 g/dL — ABNORMAL LOW (ref 12.0–15.0)
MCH: 30.1 pg (ref 26.0–34.0)
MCHC: 32.7 g/dL (ref 30.0–36.0)
MCV: 91.9 fL (ref 80.0–100.0)
Platelets: 198 10*3/uL (ref 150–400)
RBC: 2.46 MIL/uL — ABNORMAL LOW (ref 3.87–5.11)
RDW: 15.6 % — ABNORMAL HIGH (ref 11.5–15.5)
WBC: 10.3 10*3/uL (ref 4.0–10.5)
nRBC: 0 % (ref 0.0–0.2)

## 2023-04-25 LAB — RESPIRATORY PANEL BY PCR

## 2023-04-25 LAB — RENAL FUNCTION PANEL
Albumin: 2.9 g/dL — ABNORMAL LOW (ref 3.5–5.0)
Anion gap: 10 (ref 5–15)
BUN: 47 mg/dL — ABNORMAL HIGH (ref 8–23)
CO2: 27 mmol/L (ref 22–32)
Calcium: 8.7 mg/dL — ABNORMAL LOW (ref 8.9–10.3)
Chloride: 102 mmol/L (ref 98–111)
Creatinine, Ser: 8.49 mg/dL — ABNORMAL HIGH (ref 0.44–1.00)
GFR, Estimated: 5 mL/min — ABNORMAL LOW (ref >60)
Glucose, Bld: 134 mg/dL — ABNORMAL HIGH (ref 70–99)
Phosphorus: 3.8 mg/dL (ref 2.5–4.6)
Potassium: 4.5 mmol/L (ref 3.5–5.1)
Sodium: 139 mmol/L (ref 135–145)

## 2023-04-25 LAB — PROCALCITONIN: Procalcitonin: 0.69 ng/mL

## 2023-04-25 LAB — GLUCOSE, CAPILLARY
Glucose-Capillary: 112 mg/dL — ABNORMAL HIGH (ref 70–99)
Glucose-Capillary: 163 mg/dL — ABNORMAL HIGH (ref 70–99)

## 2023-04-25 LAB — CULTURE, BLOOD (ROUTINE X 2): Culture: NO GROWTH

## 2023-04-25 MED ORDER — PENTAFLUOROPROP-TETRAFLUOROETH EX AERO
INHALATION_SPRAY | CUTANEOUS | Status: AC
  Filled 2023-04-25: qty 30

## 2023-04-25 MED ORDER — DIPHENHYDRAMINE HCL 12.5 MG/5ML PO ELIX
12.5000 mg | ORAL_SOLUTION | Freq: Once | ORAL | Status: DC
  Filled 2023-04-25 (×2): qty 5

## 2023-04-25 MED ORDER — RENA-VITE PO TABS
1.0000 | ORAL_TABLET | Freq: Every day | ORAL | Status: DC
  Administered 2023-04-26 – 2023-05-08 (×14): 1 via ORAL
  Filled 2023-04-25 (×15): qty 1

## 2023-04-25 NOTE — Progress Notes (Signed)
  Received patient in bed to unit.   Informed consent signed and in chart.    TX duration:3.5hrs     Transported back to floor by transport and floor nurse d/t o2 @8L   Hand-off given to patient's nurse. No c/o and no distress noted    Access used: LAVG Access issues: none   Total UF removed: 1.5L Medication(s) given: none Post HD VS: 149/72 Post HD weight: 64.8     Lynann Beaver  [facility]

## 2023-04-25 NOTE — Progress Notes (Signed)
Nutrition Brief Note  Patient identified on the Malnutrition Screening Tool (MST) Report  Wt Readings from Last 15 Encounters:  04/25/23 64.8 kg  04/20/23 62.4 kg  01/17/22 68 kg  11/12/20 62.6 kg  11/09/20 64.4 kg  10/28/20 62.1 kg  06/25/20 65.2 kg  06/15/20 63.5 kg  04/03/20 67.3 kg  10/24/19 64.5 kg  10/06/17 63.5 kg  10/03/17 61 kg  09/10/17 64.1 kg  07/24/17 59 kg  07/12/17 59 kg   Pt with history of end-stage renal disease on dialysis Monday Wednesday Friday, paroxysmal atrial fibrillation on Eliquis, history of tracheostomy, chronic diastolic heart failure, chronic hypoxia at night, uncontrolled type 2 diabetes, hypertension, presents with worsening cough and hypoxia.   Pt admitted with CAP and respiratory failure.   Reviewed I/O's: +220 ml x 24 hours and -830 ml since admission  Spoke with pt and son at bedside. Pt currently on trach collar. She reports good appetite PTA. She generally consumes 2 meals per day (meat, starch, and vegetable) which she cooks herself. Meal pattern is the same on both HD and non-HD days.   Per nephrology notes EDW 62 kg. Pt estimates that she has lost about 2 pounds over the past month. Wt has been stable over the past year and she is currently over EDW. Pt just had HD prior to visit today.   Pt reports that she takes a renal MVI daily.   Nutrition-Focused physical exam completed. Findings are no fat depletion, no muscle depletion, and no edema.    Noted pt has a history of malnutrition, however, exam has improved greatly since last hospitalization/ RD visit on 11/2016.   Labs reviewed: K and Phos WDL. CBGS: 112. RD will liberalize diet for wider variety of meal selections.    Current diet order is renal/ carb modified with 1.5 L fluid restriction, patient is consuming approximately 50% of meals at this time. Labs and medications reviewed.   No nutrition interventions warranted at this time. If nutrition issues arise, please consult RD.    Levada Schilling, RD, LDN, CDCES Registered Dietitian II Certified Diabetes Care and Education Specialist Please refer to [facility] for RD and/or RD on-call/weekend/after hours pager

## 2023-04-25 NOTE — Progress Notes (Signed)
Trach removed, stoma site assessed. Both cleaned with sterile water. Trach replaced in stoma, dressing placed behind flange. RN at bedside during procedure per MD order

## 2023-04-25 NOTE — Progress Notes (Signed)
Date of Admission:  04/23/2023   Total days of antibiotics ***        Day ***        Day ***        Day ***   ID: Deborah [name] is a 67 y.o. female with  *** Principal Problem:   Acute on chronic respiratory failure with hypoxia ([name]) Active Problems:   S/P MVR (mitral valve replacement)   PAF (paroxysmal atrial fibrillation) ([name])   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, W, F   Type 2 diabetes mellitus with chronic kidney disease, with long-term current use of insulin ([name])   Chronic anticoagulation   Tracheostomy status ([name])   CAP (community acquired pneumonia)   Acute on chronic diastolic CHF (congestive heart failure) ([name])   Essential hypertension   Overweight (BMI 25.0-29.9)   Acute on chronic diastolic (congestive) heart failure ([name])    Subjective: ***  Medications:   amLODipine  10 mg Oral Daily   apixaban  2.5 mg Oral BID   aspirin EC  81 mg Oral Daily   atorvastatin  80 mg Oral q1800   carvedilol  25 mg Oral BID   Chlorhexidine Gluconate Cloth  6 each Topical Q0600   epoetin (EPOGEN/PROCRIT) injection  10,000 Units Intravenous Q M,W,F-HD   escitalopram  10 mg Oral Daily   gabapentin  300 mg Oral QHS   insulin aspart  0-5 Units Subcutaneous QHS   insulin aspart  0-6 Units Subcutaneous TID WC   insulin detemir  4 Units Subcutaneous BID   ipratropium-albuterol  3 mL Nebulization Q6H   irbesartan  150 mg Oral Daily   midodrine  5 mg Oral Once per day on Mon Wed Fri   pantoprazole  40 mg Oral Daily   sevelamer carbonate  1,600 mg Oral TID WC   traZODone  50 mg Oral Once per day on Mon Wed Fri    Objective: Vital signs in last 24 hours: Patient Vitals for the past 24 hrs:  BP Temp Temp src Pulse Resp SpO2 Height Weight  04/25/23 1148 (!) 149/72 98.7 F (37.1 C) Oral 88 15 92 % -- --  04/25/23 1130 (!) 149/73 -- -- 88 16 90 % -- --  04/25/23 1100 (!) 152/71 -- -- 85 -- 93 % -- --  04/25/23 1030 (!) 156/73 -- -- 82 -- 93 % -- --  04/25/23 1000 (!) 154/72 -- -- 82  (!) 23 93 % -- --  04/25/23 0930 (!) 153/74 -- -- 78 18 93 % -- --  04/25/23 0915 138/69 -- -- 76 19 -- -- --  04/25/23 0900 (!) 151/66 -- -- 76 18 91 % -- --  04/25/23 0845 (!) 154/65 -- -- 73 15 -- -- --  04/25/23 0830 (!) 155/77 -- -- 72 16 -- -- --  04/25/23 0817 136/72 -- -- 74 17 91 % -- --  04/25/23 0815 136/72 -- -- 73 (!) 27 -- -- --  04/25/23 0803 -- -- -- -- -- -- -- 65.1 kg  04/25/23 0800 139/68 98.5 F (36.9 C) Oral 74 16 92 % -- --  04/25/23 0500 -- -- -- -- -- -- -- 67.8 kg  04/25/23 0449 (!) 124/57 97.9 F (36.6 C) Oral 69 16 91 % -- --  04/25/23 0307 -- -- -- -- -- 94 % -- --  04/25/23 0015 130/71 -- -- 78 20 96 % -- --  04/24/23 2132 -- -- -- -- -- 93 % -- --  04/24/23 1954 (!) 130/56 98.7 F (37.1 C) Oral 80 18 90 % -- --  04/24/23 1552 -- -- -- -- -- -- 5\' 3"  (1.6 m) 68.2 kg  04/24/23 1538 131/64 99 F (37.2 C) Oral 72 20 94 % -- --  04/24/23 1358 -- 98.3 F (36.8 C) Oral -- -- -- -- --  04/24/23 1300 121/61 -- -- 78 19 92 % -- --  04/24/23 1230 133/64 -- -- 73 19 94 % -- --  04/24/23 1215 -- -- -- 75 -- 94 % -- --     LDA Foley Central lines Other catheters  PHYSICAL EXAM:  General: Alert, cooperative, no distress, appears stated age.  Head: Normocephalic, without obvious abnormality, atraumatic. Eyes: Conjunctivae clear, anicteric sclerae. Pupils are equal ENT Nares normal. No drainage or sinus tenderness. Lips, mucosa, and tongue normal. No Thrush Neck: Supple, symmetrical, no adenopathy, thyroid: non tender no carotid bruit and no JVD. Back: No CVA tenderness. Lungs: Clear to auscultation bilaterally. No Wheezing or Rhonchi. No rales. Heart: Regular rate and rhythm, no murmur, rub or gallop. Abdomen: Soft, non-tender,not distended. Bowel sounds normal. No masses Extremities: atraumatic, no cyanosis. No edema. No clubbing Skin: No rashes or lesions. Or bruising Lymph: Cervical, supraclavicular normal. Neurologic: Grossly non-focal  Lab  Results    Latest Ref Rng & Units 04/25/2023    6:59 AM 04/24/2023    4:43 AM 04/23/2023    9:30 AM  CBC  WBC 4.0 - 10.5 K/uL 10.3  12.1  12.2   Hemoglobin 12.0 - 15.0 g/dL 7.4  8.0  8.4   Hematocrit 36.0 - 46.0 % 22.6  23.6  25.3   Platelets 150 - 400 K/uL 198  193  179        Latest Ref Rng & Units 04/25/2023    6:59 AM 04/24/2023    4:43 AM 04/23/2023    9:30 AM  CMP  Glucose 70 - 99 mg/dL 161  096  045   BUN 8 - 23 mg/dL 47  31  57   Creatinine 0.44 - 1.00 mg/dL 4.09  8.11  9.14   Sodium 135 - 145 mmol/L 139  136  139   Potassium 3.5 - 5.1 mmol/L 4.5  4.4  4.4   Chloride 98 - 111 mmol/L 102  98  102   CO2 22 - 32 mmol/L 27  29  24    Calcium 8.9 - 10.3 mg/dL 8.7  8.4  8.8   Total Protein 6.5 - 8.1 g/dL  6.2    Total Bilirubin 0.3 - 1.2 mg/dL  1.5    Alkaline Phos 38 - 126 U/L  65    AST 15 - 41 U/L  17    ALT 0 - 44 U/L  21        Microbiology:  Studies/Results: CT CHEST WO CONTRAST  Result Date: 04/23/2023 CLINICAL DATA:  Respiratory illness, nondiagnostic xray, dyspnea EXAM: CT CHEST WITHOUT CONTRAST TECHNIQUE: Multidetector CT imaging of the chest was performed following the standard protocol without IV contrast. RADIATION DOSE REDUCTION: This exam was performed according to the departmental dose-optimization program which includes automated exposure control, adjustment of the mA and/or kV according to patient size and/or use of iterative reconstruction technique. COMPARISON:  04/18/2023 FINDINGS: Cardiovascular: Moderate multi-vessel coronary artery calcification. Mitral valve replacement has been performed. Mild global cardiomegaly. No pericardial effusion. Central pulmonary arteries are enlarged in keeping with changes of pulmonary arterial hypertension. Mild atherosclerotic calcification within the thoracic aorta. No aortic  aneurysm. Mediastinum/Nodes: Tracheostomy in expected position. Shotty right paratracheal, aortopulmonary, and subcarinal adenopathy is nonspecific,  possibly reactive in nature. This appears progressive since prior examination. Visualized thyroid is unremarkable. The esophagus is unremarkable. Lungs/Pleura: Small bilateral pleural effusions are present, enlarged since prior examination, with increasing associated bibasilar compressive atelectasis. Interval development of mild interstitial and alveolar pulmonary edema with smooth interlobular septal thickening and diffuse ground-glass infiltrate. Increasing superimposed atelectasis within the left upper lobe. No pneumothorax. No central obstructing lesion. Upper Abdomen: No acute abnormality. Musculoskeletal: No acute bone abnormality. No lytic or blastic bone lesion. IMPRESSION: 1. Interval development of mild interstitial and alveolar pulmonary edema, small but enlarging bilateral pleural effusions, and mild global cardiomegaly in keeping with mild congestive heart failure. 2. Moderate multi-vessel coronary artery calcification. 3. Morphologic changes in keeping with pulmonary arterial hypertension. 4. Shotty mediastinal adenopathy, progressive since prior examination, possibly reactive in nature. Aortic Atherosclerosis (ICD10-I70.0). Electronically Signed   By: Helyn Numbers M.D.   On: 04/23/2023 20:55     Assessment/Plan: ***

## 2023-04-25 NOTE — Progress Notes (Signed)
Mobility Specialist - Progress Note   04/25/23 0921  Mobility  Activity Off unit   Pt off unit during attempt. Will attempt at another date/time.   Terrilyn Saver  Mobility Specialist  04/25/23 9:22 AM

## 2023-04-25 NOTE — Progress Notes (Signed)
[facility]  PROGRESS NOTE  Deborah [name] ZOX:[id number] DOB: October 17, 1956 DOA: 04/23/2023 PCP: Si Gaul, MD   Brief HPI:   67 year old African-American female history of end-stage renal disease on dialysis Monday Wednesday Friday, paroxysmal atrial fibrillation on Eliquis, history of tracheostomy, chronic diastolic heart failure, chronic hypoxia at night, uncontrolled type 2 diabetes, hypertension, presents the ER today with worsening cough and hypoxia.  Patient was recently in the hospital and discharged 5/19 after treatment for pneumonia. Chest x-ray showed significant volume overload, patient was dialyzed after admission 5/20.     Assessment/Plan:    Acute on chronic respiratory failure with hypoxia -Tracheostomy status -CT chest showed vascular congestion/volume overload with acute on chronic diastolic heart failure -Nephrology consulted for dialysis -Concern for aspiration, patient has tracheostomy -Swallow evaluation obtained, recommend regular consistency diet with thin liquids, aspiration precautions  Community-acquired pneumonia -Pseudomonas grew out of tracheal aspirate culture -Had been on cefepime during hospital stay -Show prescribed cefepime during previous admission, cefepime has been restarted -Will discharge on Levaquin when stable -Sputum culture also grew Corynebacterium species -ID was consulted; plan to get 2D echo to assess valve, patient has MVR  ESRD -Patient on hemodialysis  Mitral valve replacement -2D echo ordered  Paroxysmal atrial fibrillation -Continue Eliquis, Coreg  Diabetes mellitus type 2 -Continue Levemir, sliding scale insulin with NovoLog  Hypertension -Blood pressure is stable    Medications     amLODipine  10 mg Oral Daily   apixaban  2.5 mg Oral BID   aspirin EC  81 mg Oral Daily   atorvastatin  80 mg Oral q1800   carvedilol  25 mg Oral BID   Chlorhexidine Gluconate Cloth  6 each Topical Q0600   epoetin  (EPOGEN/PROCRIT) injection  10,000 Units Intravenous Q M,W,F-HD   escitalopram  10 mg Oral Daily   gabapentin  300 mg Oral QHS   insulin aspart  0-5 Units Subcutaneous QHS   insulin aspart  0-6 Units Subcutaneous TID WC   insulin detemir  4 Units Subcutaneous BID   ipratropium-albuterol  3 mL Nebulization Q6H   irbesartan  150 mg Oral Daily   midodrine  5 mg Oral Once per day on Mon Wed Fri   multivitamin  1 tablet Oral QHS   pantoprazole  40 mg Oral Daily   sevelamer carbonate  1,600 mg Oral TID WC   traZODone  50 mg Oral Once per day on Mon Wed Fri     Data Reviewed:   CBG:  Recent Labs  Lab 04/24/23 1119 04/24/23 1716 04/24/23 2149 04/25/23 1344 04/25/23 1640  GLUCAP 180* 124* 168* 112* 163*    SpO2: 91 % O2 Flow Rate (L/min): 10 L/min FiO2 (%): 40 %    Vitals:   04/25/23 1232 04/25/23 1342 04/25/23 1344 04/25/23 1643  BP:   (!) 191/92 (!) 119/55  Pulse:  87 (!) 102 80  Resp:  18 20 18   Temp:   98.6 F (37 C) 99.1 F (37.3 C)  TempSrc:   Oral Oral  SpO2:  100% 100% 91%  Weight: 64.8 kg     Height:          Data Reviewed:  Basic Metabolic Panel: Recent Labs  Lab 04/18/23 1958 04/19/23 0524 04/20/23 0456 04/23/23 0930 04/24/23 0443 04/25/23 0659  NA  --  140 137 139 136 139  K  --  3.7 4.4 4.4 4.4 4.5  CL  --  97* 98 102 98 102  CO2  --  29 29 24 29 27   GLUCOSE  --  152* 110* 135* 148* 134*  BUN  --  46* 33* 57* 31* 47*  CREATININE  --  7.59* 5.49* 9.19* 5.95* 8.49*  CALCIUM  --  8.0* 8.1* 8.8* 8.4* 8.7*  MG 1.8  --   --   --   --   --   PHOS  --   --   --   --   --  3.8    CBC: Recent Labs  Lab 04/19/23 0524 04/20/23 0456 04/23/23 0930 04/24/23 0443 04/25/23 0659  WBC 7.6 7.4 12.2* 12.1* 10.3  NEUTROABS  --   --  9.8* 10.4*  --   HGB 8.6* 8.3* 8.4* 8.0* 7.4*  HCT 25.3* 24.7* 25.3* 23.6* 22.6*  MCV 89.4 90.8 92.3 91.5 91.9  PLT 156 162 179 193 198    LFT Recent Labs  Lab 04/24/23 0443 04/25/23 0659  AST 17  --   ALT 21   --   ALKPHOS 65  --   BILITOT 1.5*  --   PROT 6.2*  --   ALBUMIN 3.2* 2.9*     Antibiotics: Anti-infectives (From admission, onward)    Start     Dose/Rate Route Frequency Ordered Stop   04/23/23 2200  ceFEPIme (MAXIPIME) 1 g in sodium chloride 0.9 % 100 mL IVPB        1 g 200 mL/hr over 30 Minutes Intravenous Every 24 hours 04/23/23 2123     04/23/23 2015  ceFEPIme (MAXIPIME) 2 g in sodium chloride 0.9 % 100 mL IVPB  Status:  Discontinued        2 g 200 mL/hr over 30 Minutes Intravenous  Once 04/23/23 2012 04/23/23 2123   04/23/23 2015  azithromycin (ZITHROMAX) 500 mg in sodium chloride 0.9 % 250 mL IVPB        500 mg 250 mL/hr over 60 Minutes Intravenous  Once 04/23/23 2012 04/24/23 0327        DVT prophylaxis: Apixaban  Code Status: Full code  Family Communication: No family at bedside   CONSULTS nephrology   Subjective   Denies shortness of breath   Objective    Physical Examination:   General-appears in no acute distress Neck-tracheostomy in place Heart-S1-S2, regular, no murmur auscultated Lungs-clear to auscultation bilaterally, no wheezing or crackles auscultated Abdomen-soft, nontender, no organomegaly Extremities-no edema in the lower extremities Neuro-alert, oriented x3, no focal deficit noted  Status is: Inpatient:             [name]   [facility] If 7PM-7AM, please contact night-coverage at [email or web address], Office  [phone]   04/25/2023, 5:58 PM  LOS: 1 day

## 2023-04-25 NOTE — Progress Notes (Signed)
[facility]  ROUNDING NOTE   Subjective:   Deborah [name] returns to [facility] on 04/23/2023 for Acute on chronic respiratory failure with hypoxia ([name]) [J96.21] Acute on chronic diastolic (congestive) heart failure ([name]) [I50.33]  Patient is known to our practice from previous admissions and receives outpatient dialysis at [facility] on a MWF schedule, supervised by [facility] physicians.  Patient seen and evaluated during dialysis   HEMODIALYSIS FLOWSHEET:  Blood Flow Rate (mL/min): 400 mL/min Arterial Pressure (mmHg): -120 mmHg Venous Pressure (mmHg): 180 mmHg TMP (mmHg): 14 mmHg Ultrafiltration Rate (mL/min): 686 mL/min Dialysate Flow Rate (mL/min): 300 ml/min  Tolerating treatment well.    Objective:  Vital signs in last 24 hours:  Temp:  [97.9 F (36.6 C)-99 F (37.2 C)] 98.5 F (36.9 C) (05/22 0800) Pulse Rate:  [69-85] 85 (05/22 1100) Resp:  [15-27] 23 (05/22 1000) BP: (121-156)/(56-77) 152/71 (05/22 1100) SpO2:  [90 %-96 %] 93 % (05/22 1100) FiO2 (%):  [40 %-50 %] 40 % (05/22 0307) Weight:  [65.1 kg-68.2 kg] 65.1 kg (05/22 0803)  Weight change:  Filed Weights   04/24/23 1552 04/25/23 0500 04/25/23 0803  Weight: 68.2 kg 67.8 kg 65.1 kg    Intake/Output: I/O last 3 completed shifts: In: 470 [P.O.:120; IV Piggyback:350] Out: -    Intake/Output this shift:  No intake/output data recorded.  Physical Exam: General: NAD, laying in bed  Head: Normocephalic, atraumatic. Moist oral mucosal membranes  Eyes: Anicteric  Neck: +tracheostomy  Lungs:  Rhonchi bilaterally   Heart: Regular rate and rhythm  Abdomen:  Soft, nontender  Extremities:  no peripheral edema.  Neurologic: Nonfocal, moving all four extremities  Skin: No lesions  Access: Left AVG    Basic Metabolic Panel: Recent Labs  Lab 04/18/23 1958 04/19/23 0524 04/20/23 0456 04/23/23 0930 04/24/23 0443 04/25/23 0659  NA  --  140 137 139 136 139  K  --  3.7 4.4 4.4 4.4 4.5  CL   --  97* 98 102 98 102  CO2  --  29 29 24 29 27   GLUCOSE  --  152* 110* 135* 148* 134*  BUN  --  46* 33* 57* 31* 47*  CREATININE  --  7.59* 5.49* 9.19* 5.95* 8.49*  CALCIUM  --  8.0* 8.1* 8.8* 8.4* 8.7*  MG 1.8  --   --   --   --   --   PHOS  --   --   --   --   --  3.8     Liver Function Tests: Recent Labs  Lab 04/24/23 0443 04/25/23 0659  AST 17  --   ALT 21  --   ALKPHOS 65  --   BILITOT 1.5*  --   PROT 6.2*  --   ALBUMIN 3.2* 2.9*    No results for input(s): "LIPASE", "AMYLASE" in the last 168 hours. No results for input(s): "AMMONIA" in the last 168 hours.  CBC: Recent Labs  Lab 04/19/23 0524 04/20/23 0456 04/23/23 0930 04/24/23 0443 04/25/23 0659  WBC 7.6 7.4 12.2* 12.1* 10.3  NEUTROABS  --   --  9.8* 10.4*  --   HGB 8.6* 8.3* 8.4* 8.0* 7.4*  HCT 25.3* 24.7* 25.3* 23.6* 22.6*  MCV 89.4 90.8 92.3 91.5 91.9  PLT 156 162 179 193 198     Cardiac Enzymes: No results for input(s): "CKTOTAL", "CKMB", "CKMBINDEX", "TROPONINI" in the last 168 hours.  BNP: Invalid input(s): "POCBNP"  CBG: Recent Labs  Lab 04/23/23  2329 04/24/23 0729 04/24/23 1119 04/24/23 1716 04/24/23 2149  GLUCAP 153* 104* 180* 124* 168*     Microbiology: Results for orders placed or performed during the hospital encounter of 04/23/23  SARS Coronavirus 2 by RT PCR (hospital order, performed in [facility] hospital lab) *cepheid single result test* Anterior Nasal Swab     Status: None   Collection Time: 04/23/23  9:30 AM   Specimen: Anterior Nasal Swab  Result Value Ref Range Status   SARS Coronavirus 2 by RT PCR NEGATIVE NEGATIVE Final    Comment: (NOTE) SARS-CoV-2 target nucleic acids are NOT DETECTED.  The SARS-CoV-2 RNA is generally detectable in upper and lower respiratory specimens during the acute phase of infection. The lowest concentration of SARS-CoV-2 viral copies this assay can detect is 250 copies / mL. A negative result does not preclude SARS-CoV-2 infection and  should not be used as the sole basis for treatment or other patient management decisions.  A negative result may occur with improper specimen collection / handling, submission of specimen other than nasopharyngeal swab, presence of viral mutation(s) within the areas targeted by this assay, and inadequate number of viral copies (<250 copies / mL). A negative result must be combined with clinical observations, patient history, and epidemiological information.  Fact Sheet for Patients:   RoadLapTop.co.za  Fact Sheet for Healthcare Providers: http://kim-miller.com/  This test is not yet approved or  cleared by the [place] FDA and has been authorized for detection and/or diagnosis of SARS-CoV-2 by FDA under an Emergency Use Authorization (EUA).  This EUA will remain in effect (meaning this test can be used) for the duration of the COVID-19 declaration under Section 564(b)(1) of the Act, 21 U.S.C. section 360bbb-3(b)(1), unless the authorization is terminated or revoked sooner.  Performed at [facility], [address] [place], [place] [address]   Culture, blood (Routine X 2) w Reflex to ID Panel     Status: None (Preliminary result)   Collection Time: 04/24/23  7:52 PM   Specimen: BLOOD  Result Value Ref Range Status   Specimen Description BLOOD RIGHT ANTECUBITAL  Final   Special Requests   Final    BOTTLES DRAWN AEROBIC AND ANAEROBIC Blood Culture adequate volume   Culture   Final    NO GROWTH < 12 HOURS Performed at [facility], [address], [place], [place] [address]    Report Status PENDING  Incomplete  Culture, blood (Routine X 2) w Reflex to ID Panel     Status: None (Preliminary result)   Collection Time: 04/24/23  7:56 PM   Specimen: BLOOD  Result Value Ref Range Status   Specimen Description BLOOD RIGHT ANTECUBITAL  Final   Special Requests   Final    BOTTLES DRAWN AEROBIC AND ANAEROBIC Blood  Culture adequate volume   Culture   Final    NO GROWTH < 12 HOURS Performed at [facility], [address], [place], [place] [address]    Report Status PENDING  Incomplete    Coagulation Studies: No results for input(s): "LABPROT", "INR" in the last 72 hours.   Urinalysis: No results for input(s): "COLORURINE", "LABSPEC", "PHURINE", "GLUCOSEU", "HGBUR", "BILIRUBINUR", "KETONESUR", "PROTEINUR", "UROBILINOGEN", "NITRITE", "LEUKOCYTESUR" in the last 72 hours.  Invalid input(s): "APPERANCEUR"    Imaging: CT CHEST WO CONTRAST  Result Date: 04/23/2023 CLINICAL DATA:  Respiratory illness, nondiagnostic xray, dyspnea EXAM: CT CHEST WITHOUT CONTRAST TECHNIQUE: Multidetector CT imaging of the chest was performed following the standard protocol without IV  contrast. RADIATION DOSE REDUCTION: This exam was performed according to the departmental dose-optimization program which includes automated exposure control, adjustment of the mA and/or kV according to patient size and/or use of iterative reconstruction technique. COMPARISON:  04/18/2023 FINDINGS: Cardiovascular: Moderate multi-vessel coronary artery calcification. Mitral valve replacement has been performed. Mild global cardiomegaly. No pericardial effusion. Central pulmonary arteries are enlarged in keeping with changes of pulmonary arterial hypertension. Mild atherosclerotic calcification within the thoracic aorta. No aortic aneurysm. Mediastinum/Nodes: Tracheostomy in expected position. Shotty right paratracheal, aortopulmonary, and subcarinal adenopathy is nonspecific, possibly reactive in nature. This appears progressive since prior examination. Visualized thyroid is unremarkable. The esophagus is unremarkable. Lungs/Pleura: Small bilateral pleural effusions are present, enlarged since prior examination, with increasing associated bibasilar compressive atelectasis. Interval development of mild interstitial and alveolar pulmonary edema  with smooth interlobular septal thickening and diffuse ground-glass infiltrate. Increasing superimposed atelectasis within the left upper lobe. No pneumothorax. No central obstructing lesion. Upper Abdomen: No acute abnormality. Musculoskeletal: No acute bone abnormality. No lytic or blastic bone lesion. IMPRESSION: 1. Interval development of mild interstitial and alveolar pulmonary edema, small but enlarging bilateral pleural effusions, and mild global cardiomegaly in keeping with mild congestive heart failure. 2. Moderate multi-vessel coronary artery calcification. 3. Morphologic changes in keeping with pulmonary arterial hypertension. 4. Shotty mediastinal adenopathy, progressive since prior examination, possibly reactive in nature. Aortic Atherosclerosis (ICD10-I70.0). Electronically Signed   By: Helyn Numbers M.D.   On: 04/23/2023 20:55     Medications:    anticoagulant sodium citrate     ceFEPime (MAXIPIME) IV 1 g (04/25/23 0018)     amLODipine  10 mg Oral Daily   apixaban  2.5 mg Oral BID   aspirin EC  81 mg Oral Daily   atorvastatin  80 mg Oral q1800   carvedilol  25 mg Oral BID   Chlorhexidine Gluconate Cloth  6 each Topical Q0600   epoetin (EPOGEN/PROCRIT) injection  10,000 Units Intravenous Q M,W,F-HD   escitalopram  10 mg Oral Daily   gabapentin  300 mg Oral QHS   insulin aspart  0-5 Units Subcutaneous QHS   insulin aspart  0-6 Units Subcutaneous TID WC   insulin detemir  4 Units Subcutaneous BID   ipratropium-albuterol  3 mL Nebulization Q6H   irbesartan  150 mg Oral Daily   midodrine  5 mg Oral Once per day on Mon Wed Fri   pantoprazole  40 mg Oral Daily   sevelamer carbonate  1,600 mg Oral TID WC   traZODone  50 mg Oral Once per day on Mon Wed Fri   acetaminophen **OR** acetaminophen, alteplase, anticoagulant sodium citrate, guaiFENesin-dextromethorphan, heparin, lidocaine (PF), lidocaine-prilocaine, ondansetron **OR** ondansetron (ZOFRAN) IV,  pentafluoroprop-tetrafluoroeth  Assessment/ Plan:  [name] is a 67 y.o.  female with end stage renal disease on hemodialysis, tracheostomy, hypertension, hyperlipidemia, diabetes mellitus type II insulin dependent, depression, and diabetic neuropathy who presents to [facility] for 04/23/2023 for Acute on chronic respiratory failure with hypoxia ([name]) [J96.21] Acute on chronic diastolic (congestive) heart failure ([name]) [I50.33]  [facility] MWF [address] Left AVG 62kg  End Stage Kidney Disease:  -Receiving dialysis today, UF 1.5 L as tolerated. - Next treatment scheduled for Friday  Hypertension with chronic kidney disease: current regimen of irbesartan, carvedilol and amlodipine. Home regimen of olmesartan instead of irbesartan. Also prescribed Midodrine to prevent hypotension during dialysis.   -Blood pressure 154/65 during dialysis.  Midodrine held during treatment today.   Diabetes mellitus type II with  chronic kidney disease: insulin dependent.  Primary team to manage sliding scale insulin.  Secondary Hyperparathyroidism  - Calcium acceptable.  - Continue sevelamer with meals  Anemia of chronic kidney disease: hemoglobin 7.4 -Continue EPO with dialysis   LOS: 1   5/22/202411:31 AM

## 2023-04-26 ENCOUNTER — Inpatient Hospital Stay (HOSPITAL_COMMUNITY)
Admit: 2023-04-26 | Discharge: 2023-04-26 | Disposition: A | Discharge status: Home / Self Care | Payer: 59 | Attending: Infectious Diseases | Admitting: Infectious Diseases

## 2023-04-26 DIAGNOSIS — Z930 Tracheostomy status: Secondary | ICD-10-CM | POA: Diagnosis not present

## 2023-04-26 DIAGNOSIS — Z952 Presence of prosthetic heart valve: Secondary | ICD-10-CM | POA: Diagnosis not present

## 2023-04-26 DIAGNOSIS — Z7901 Long term (current) use of anticoagulants: Secondary | ICD-10-CM | POA: Diagnosis not present

## 2023-04-26 DIAGNOSIS — I5033 Acute on chronic diastolic (congestive) heart failure: Secondary | ICD-10-CM | POA: Diagnosis not present

## 2023-04-26 DIAGNOSIS — B965 Pseudomonas (aeruginosa) (mallei) (pseudomallei) as the cause of diseases classified elsewhere: Secondary | ICD-10-CM

## 2023-04-26 DIAGNOSIS — J9621 Acute and chronic respiratory failure with hypoxia: Secondary | ICD-10-CM | POA: Diagnosis not present

## 2023-04-26 DIAGNOSIS — J189 Pneumonia, unspecified organism: Secondary | ICD-10-CM | POA: Diagnosis not present

## 2023-04-26 DIAGNOSIS — J988 Other specified respiratory disorders: Secondary | ICD-10-CM | POA: Diagnosis not present

## 2023-04-26 LAB — CULTURE, BLOOD (ROUTINE X 2): Special Requests: ADEQUATE

## 2023-04-26 LAB — CBC
HCT: 23.2 % — ABNORMAL LOW (ref 36.0–46.0)
Hemoglobin: 7.9 g/dL — ABNORMAL LOW (ref 12.0–15.0)
MCH: 30.5 pg (ref 26.0–34.0)
MCHC: 34.1 g/dL (ref 30.0–36.0)
MCV: 89.6 fL (ref 80.0–100.0)
Platelets: 216 10*3/uL (ref 150–400)
RBC: 2.59 MIL/uL — ABNORMAL LOW (ref 3.87–5.11)
RDW: 15.2 % (ref 11.5–15.5)
WBC: 9.6 10*3/uL (ref 4.0–10.5)
nRBC: 0 % (ref 0.0–0.2)

## 2023-04-26 LAB — BASIC METABOLIC PANEL
Anion gap: 11 (ref 5–15)
BUN: 31 mg/dL — ABNORMAL HIGH (ref 8–23)
CO2: 28 mmol/L (ref 22–32)
Calcium: 8.8 mg/dL — ABNORMAL LOW (ref 8.9–10.3)
Chloride: 97 mmol/L — ABNORMAL LOW (ref 98–111)
Creatinine, Ser: 6.29 mg/dL — ABNORMAL HIGH (ref 0.44–1.00)
GFR, Estimated: 7 mL/min — ABNORMAL LOW (ref >60)
Glucose, Bld: 113 mg/dL — ABNORMAL HIGH (ref 70–99)
Potassium: 4.3 mmol/L (ref 3.5–5.1)
Sodium: 136 mmol/L (ref 135–145)

## 2023-04-26 LAB — ECHOCARDIOGRAM COMPLETE
AR max vel: 1.97 cm2
AV Area VTI: 2.2 cm2
AV Area mean vel: 2.14 cm2
AV Mean grad: 4.5 mmHg
AV Peak grad: 8.7 mmHg
Ao pk vel: 1.48 m/s
Area-P 1/2: 2.94 cm2
Height: 63 in
MV VTI: 1.06 cm2
S' Lateral: 2.3 cm
Weight: 2285.73 oz

## 2023-04-26 LAB — GLUCOSE, CAPILLARY
Glucose-Capillary: 124 mg/dL — ABNORMAL HIGH (ref 70–99)
Glucose-Capillary: 130 mg/dL — ABNORMAL HIGH (ref 70–99)
Glucose-Capillary: 168 mg/dL — ABNORMAL HIGH (ref 70–99)
Glucose-Capillary: 183 mg/dL — ABNORMAL HIGH (ref 70–99)
Glucose-Capillary: 210 mg/dL — ABNORMAL HIGH (ref 70–99)

## 2023-04-26 LAB — HEPATITIS B SURFACE ANTIGEN

## 2023-04-26 NOTE — Progress Notes (Signed)
[facility]  ROUNDING NOTE   Subjective:   [name] returns to [facility] on 04/23/2023 for Acute on chronic respiratory failure with hypoxia ([name]) [J96.21] Acute on chronic diastolic (congestive) heart failure ([name]) [I50.33]  Patient is known to our practice from previous admissions and receives outpatient dialysis at [facility] on a MWF schedule, supervised by [facility] physicians.  Patient seen sitting up in bed, family at bedside Reports oxygen saturations dropped to 87 early this morning. No complaints to offer  Trach collar at 10 L   Objective:  Vital signs in last 24 hours:  Temp:  [97.3 F (36.3 C)-99.4 F (37.4 C)] 97.3 F (36.3 C) (05/23 1623) Pulse Rate:  [73-86] 77 (05/23 1623) Resp:  [16-20] 18 (05/23 1623) BP: (122-155)/(34-68) 125/34 (05/23 1623) SpO2:  [90 %-99 %] 92 % (05/23 1623) FiO2 (%):  [50 %] 50 % (05/23 0819)  Weight change: -3.5 kg Filed Weights   04/25/23 0500 04/25/23 0803 04/25/23 1232  Weight: 67.8 kg 65.1 kg 64.8 kg    Intake/Output: I/O last 3 completed shifts: In: 120 [P.O.:120] Out: 1500 [Other:1500]   Intake/Output this shift:  No intake/output data recorded.  Physical Exam: General: NAD, laying in bed  Head: Normocephalic, atraumatic. Moist oral mucosal membranes  Eyes: Anicteric  Neck: +tracheostomy  Lungs:  Rhonchi bilaterally   Heart: Regular rate and rhythm  Abdomen:  Soft, nontender  Extremities:  no peripheral edema.  Neurologic: Nonfocal, moving all four extremities  Skin: No lesions  Access: Left AVG    Basic Metabolic Panel: Recent Labs  Lab 04/20/23 0456 04/23/23 0930 04/24/23 0443 04/25/23 0659 04/26/23 0903  NA 137 139 136 139 136  K 4.4 4.4 4.4 4.5 4.3  CL 98 102 98 102 97*  CO2 29 24 29 27 28   GLUCOSE 110* 135* 148* 134* 113*  BUN 33* 57* 31* 47* 31*  CREATININE 5.49* 9.19* 5.95* 8.49* 6.29*  CALCIUM 8.1* 8.8* 8.4* 8.7* 8.8*  PHOS  --   --   --  3.8  --      Liver Function  Tests: Recent Labs  Lab 04/24/23 0443 04/25/23 0659  AST 17  --   ALT 21  --   ALKPHOS 65  --   BILITOT 1.5*  --   PROT 6.2*  --   ALBUMIN 3.2* 2.9*    No results for input(s): "LIPASE", "AMYLASE" in the last 168 hours. No results for input(s): "AMMONIA" in the last 168 hours.  CBC: Recent Labs  Lab 04/20/23 0456 04/23/23 0930 04/24/23 0443 04/25/23 0659 04/26/23 0903  WBC 7.4 12.2* 12.1* 10.3 9.6  NEUTROABS  --  9.8* 10.4*  --   --   HGB 8.3* 8.4* 8.0* 7.4* 7.9*  HCT 24.7* 25.3* 23.6* 22.6* 23.2*  MCV 90.8 92.3 91.5 91.9 89.6  PLT 162 179 193 198 216     Cardiac Enzymes: No results for input(s): "CKTOTAL", "CKMB", "CKMBINDEX", "TROPONINI" in the last 168 hours.  BNP: Invalid input(s): "POCBNP"  CBG: Recent Labs  Lab 04/25/23 1344 04/25/23 1640 04/26/23 0023 04/26/23 0927 04/26/23 1209  GLUCAP 112* 163* 168* 124* 210*     Microbiology: Results for orders placed or performed during the hospital encounter of 04/23/23  SARS Coronavirus 2 by RT PCR (hospital order, performed in [facility] hospital lab) *cepheid single result test* Anterior Nasal Swab     Status: None   Collection Time: 04/23/23  9:30 AM   Specimen: Anterior Nasal Swab  Result  Value Ref Range Status   SARS Coronavirus 2 by RT PCR NEGATIVE NEGATIVE Final    Comment: (NOTE) SARS-CoV-2 target nucleic acids are NOT DETECTED.  The SARS-CoV-2 RNA is generally detectable in upper and lower respiratory specimens during the acute phase of infection. The lowest concentration of SARS-CoV-2 viral copies this assay can detect is 250 copies / mL. A negative result does not preclude SARS-CoV-2 infection and should not be used as the sole basis for treatment or other patient management decisions.  A negative result may occur with improper specimen collection / handling, submission of specimen other than nasopharyngeal swab, presence of viral mutation(s) within the areas targeted by this assay, and  inadequate number of viral copies (<250 copies / mL). A negative result must be combined with clinical observations, patient history, and epidemiological information.  Fact Sheet for Patients:   RoadLapTop.co.za  Fact Sheet for Healthcare Providers: http://kim-miller.com/  This test is not yet approved or  cleared by the [place] FDA and has been authorized for detection and/or diagnosis of SARS-CoV-2 by FDA under an Emergency Use Authorization (EUA).  This EUA will remain in effect (meaning this test can be used) for the duration of the COVID-19 declaration under Section 564(b)(1) of the Act, 21 U.S.C. section 360bbb-3(b)(1), unless the authorization is terminated or revoked sooner.  Performed at [facility], [address] [place], [place] [address]   Culture, blood (Routine X 2) w Reflex to ID Panel     Status: None (Preliminary result)   Collection Time: 04/24/23  7:52 PM   Specimen: BLOOD  Result Value Ref Range Status   Specimen Description BLOOD RIGHT ANTECUBITAL  Final   Special Requests   Final    BOTTLES DRAWN AEROBIC AND ANAEROBIC Blood Culture adequate volume   Culture   Final    NO GROWTH 2 DAYS Performed at [facility], [address], [place], [place] [address]    Report Status PENDING  Incomplete  Culture, blood (Routine X 2) w Reflex to ID Panel     Status: None (Preliminary result)   Collection Time: 04/24/23  7:56 PM   Specimen: BLOOD  Result Value Ref Range Status   Specimen Description BLOOD RIGHT ANTECUBITAL  Final   Special Requests   Final    BOTTLES DRAWN AEROBIC AND ANAEROBIC Blood Culture adequate volume   Culture   Final    NO GROWTH 2 DAYS Performed at [facility], [address] [place], [place] [address]    Report Status PENDING  Incomplete  Respiratory (~20 pathogens) panel by PCR     Status: None   Collection Time: 04/25/23  2:01 PM   Specimen:  Nasopharyngeal Swab; Respiratory  Result Value Ref Range Status   Adenovirus NOT DETECTED NOT DETECTED Final   Coronavirus 229E NOT DETECTED NOT DETECTED Final    Comment: (NOTE) The Coronavirus on the Respiratory Panel, DOES NOT test for the novel  Coronavirus (2019 nCoV)    Coronavirus HKU1 NOT DETECTED NOT DETECTED Final   Coronavirus NL63 NOT DETECTED NOT DETECTED Final   Coronavirus OC43 NOT DETECTED NOT DETECTED Final   Metapneumovirus NOT DETECTED NOT DETECTED Final   Rhinovirus / Enterovirus NOT DETECTED NOT DETECTED Final   Influenza A NOT DETECTED NOT DETECTED Final   Influenza B NOT DETECTED NOT DETECTED Final   Parainfluenza Virus 1 NOT DETECTED NOT DETECTED Final   Parainfluenza Virus 2 NOT DETECTED NOT DETECTED Final   Parainfluenza Virus 3 NOT DETECTED  NOT DETECTED Final   Parainfluenza Virus 4 NOT DETECTED NOT DETECTED Final   Respiratory Syncytial Virus NOT DETECTED NOT DETECTED Final   Bordetella pertussis NOT DETECTED NOT DETECTED Final   Bordetella Parapertussis NOT DETECTED NOT DETECTED Final   Chlamydophila pneumoniae NOT DETECTED NOT DETECTED Final   Mycoplasma pneumoniae NOT DETECTED NOT DETECTED Final    Comment: Performed at [facility], [address] [place], [place] [address]    Coagulation Studies: No results for input(s): "LABPROT", "INR" in the last 72 hours.   Urinalysis: No results for input(s): "COLORURINE", "LABSPEC", "PHURINE", "GLUCOSEU", "HGBUR", "BILIRUBINUR", "KETONESUR", "PROTEINUR", "UROBILINOGEN", "NITRITE", "LEUKOCYTESUR" in the last 72 hours.  Invalid input(s): "APPERANCEUR"    Imaging: ECHOCARDIOGRAM COMPLETE  Result Date: 04/26/2023    ECHOCARDIOGRAM REPORT   Patient Name:   TOMESHIA NISSLEY Date of Exam: 04/26/2023 Medical Rec #:  [id number]     Height:       63.0 in Accession #:    [id number]    Weight:       142.9 lb Date of Birth:  12-Sep-1956     BSA:          1.676 m Patient Age:    66 years      BP:           129/68 mmHg  Patient Gender: F             HR:           73 bpm. Exam Location:  [facility] Procedure: 2D Echo, Cardiac Doppler and Color Doppler Indications:     CHF                  s/p Mitral valve replacement Z95.2  History:         Patient has prior history of Echocardiogram examinations, most                  recent 01/04/2022. Risk Factors:Diabetes and Hypertension. ESRD,                  Mitral valve replacement.  Sonographer:     Cristela Blue Referring Phys:  [id number] Lynn Ito Diagnosing Phys: Julien Nordmann MD IMPRESSIONS  1. Left ventricular ejection fraction, by estimation, is 55 to 60%. The left ventricle has normal function. The left ventricle has no regional wall motion abnormalities. There is moderate left ventricular hypertrophy. Left ventricular diastolic parameters are consistent with Grade II diastolic dysfunction (pseudonormalization).  2. Right ventricular systolic function is normal. The right ventricular size is normal. There is severely elevated pulmonary artery systolic pressure. The estimated right ventricular systolic pressure is 82.8 mmHg.  3. The mitral valve has been repaired/replaced. No evidence of mitral valve regurgitation. No evidence of mitral stenosis. The mean mitral valve gradient is 9.0 mmHg.  4. Tricuspid valve regurgitation is mild to moderate.  5. The aortic valve has an indeterminant number of cusps. Aortic valve regurgitation is not visualized. Aortic valve sclerosis is present, with no evidence of aortic valve stenosis.  6. The inferior vena cava is normal in size with greater than 50% respiratory variability, suggesting right atrial pressure of 3 mmHg. FINDINGS  Left Ventricle: Left ventricular ejection fraction, by estimation, is 55 to 60%. The left ventricle has normal function. The left ventricle has no regional wall motion abnormalities. The left ventricular internal cavity size was normal in size. There is  moderate left ventricular hypertrophy. Left ventricular diastolic  parameters are consistent with Grade II  diastolic dysfunction (pseudonormalization). Right Ventricle: The right ventricular size is normal. No increase in right ventricular wall thickness. Right ventricular systolic function is normal. There is severely elevated pulmonary artery systolic pressure. The tricuspid regurgitant velocity is 4.41 m/s, and with an assumed right atrial pressure of 5 mmHg, the estimated right ventricular systolic pressure is 82.8 mmHg. Left Atrium: Left atrial size was normal in size. Right Atrium: Right atrial size was normal in size. Pericardium: There is no evidence of pericardial effusion. Mitral Valve: The mitral valve has been repaired/replaced. No evidence of mitral valve regurgitation. 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 No evidence of mitral valve stenosis. MV peak gradient, 18.8 mmHg. The mean mitral valve gradient is 9.0 mmHg. Tricuspid Valve: The tricuspid valve is normal in structure. Tricuspid valve regurgitation is mild to moderate. No evidence of tricuspid stenosis. Aortic Valve: The aortic valve has an indeterminant number of cusps. Aortic valve regurgitation is not visualized. Aortic valve sclerosis is present, with no evidence of aortic valve stenosis. Aortic valve mean gradient measures 4.5 mmHg. Aortic valve peak gradient measures 8.7 mmHg. Aortic valve area, by VTI measures 2.20 cm. Pulmonic Valve: The pulmonic valve was normal in structure. Pulmonic valve regurgitation is not visualized. No evidence of pulmonic stenosis. Aorta: The aortic root is normal in size and structure. Venous: The inferior vena cava is normal in size with greater than 50% respiratory variability, suggesting right atrial pressure of 3 mmHg. IAS/Shunts: No atrial level shunt detected by color flow Doppler.  LEFT VENTRICLE PLAX 2D LVIDd:         3.90 cm   Diastology LVIDs:         2.30 cm   LV e' medial:    7.40 cm/s LV PW:         1.60 cm   LV E/e' medial:  24.2 LV IVS:         1.50 cm   LV e' lateral:   7.94 cm/s LVOT diam:     2.10 cm   LV E/e' lateral: 22.5 LV SV:         62 LV SV Index:   37 LVOT Area:     3.46 cm  RIGHT VENTRICLE RV Basal diam:  4.50 cm RV Mid diam:    3.40 cm RV S prime:     11.60 cm/s TAPSE (M-mode): 2.6 cm LEFT ATRIUM           Index        RIGHT ATRIUM           Index LA diam:      3.70 cm 2.21 cm/m   RA Area:     20.70 cm LA Vol (A2C): 14.1 ml 8.41 ml/m   RA Volume:   65.80 ml  39.26 ml/m LA Vol (A4C): 30.9 ml 18.44 ml/m  AORTIC VALVE AV Area (Vmax):    1.97 cm AV Area (Vmean):   2.14 cm AV Area (VTI):     2.20 cm AV Vmax:           147.50 cm/s AV Vmean:          97.250 cm/s AV VTI:            0.282 m AV Peak Grad:      8.7 mmHg AV Mean Grad:      4.5 mmHg LVOT Vmax:         83.80 cm/s LVOT Vmean:        60.000  cm/s LVOT VTI:          0.179 m LVOT/AV VTI ratio: 0.63  AORTA Ao Root diam: 3.10 cm MITRAL VALVE                TRICUSPID VALVE MV Area (PHT): 2.94 cm     TR Peak grad:   77.8 mmHg MV Area VTI:   1.06 cm     TR Vmax:        441.00 cm/s MV Peak grad:  18.8 mmHg MV Mean grad:  9.0 mmHg     SHUNTS MV Vmax:       2.17 m/s     Systemic VTI:  0.18 m MV Vmean:      140.0 cm/s   Systemic Diam: 2.10 cm MV Decel Time: 258 msec MV E velocity: 179.00 cm/s MV A velocity: 149.00 cm/s MV E/A ratio:  1.20 Julien Nordmann MD Electronically signed by Julien Nordmann MD Signature Date/Time: 04/26/2023/12:58:36 PM    Final      Medications:    ceFEPime (MAXIPIME) IV 1 g (04/26/23 0029)     amLODipine  10 mg Oral Daily   apixaban  2.5 mg Oral BID   aspirin EC  81 mg Oral Daily   atorvastatin  80 mg Oral q1800   carvedilol  25 mg Oral BID   Chlorhexidine Gluconate Cloth  6 each Topical Q0600   diphenhydrAMINE  12.5 mg Oral Once   epoetin (EPOGEN/PROCRIT) injection  10,000 Units Intravenous Q M,W,F-HD   escitalopram  10 mg Oral Daily   gabapentin  300 mg Oral QHS   insulin aspart  0-5 Units Subcutaneous QHS   insulin aspart  0-6 Units  Subcutaneous TID WC   insulin detemir  4 Units Subcutaneous BID   ipratropium-albuterol  3 mL Nebulization Q6H   irbesartan  150 mg Oral Daily   midodrine  5 mg Oral Once per day on Mon Wed Fri   multivitamin  1 tablet Oral QHS   pantoprazole  40 mg Oral Daily   sevelamer carbonate  1,600 mg Oral TID WC   traZODone  50 mg Oral Once per day on Mon Wed Fri   acetaminophen **OR** acetaminophen, guaiFENesin-dextromethorphan, ondansetron **OR** ondansetron (ZOFRAN) IV  Assessment/ Plan:  [name] is a 67 y.o.  female with end stage renal disease on hemodialysis, tracheostomy, hypertension, hyperlipidemia, diabetes mellitus type II insulin dependent, depression, and diabetic neuropathy who presents to [facility] for 04/23/2023 for Acute on chronic respiratory failure with hypoxia ([name]) [J96.21] Acute on chronic diastolic (congestive) heart failure ([name]) [I50.33]  [facility] MWF [address] Left AVG 62kg  End Stage Kidney Disease:  -Dialysis received yesterday, UF 1.5 L achieved. - Next treatment scheduled for Friday  Hypertension with chronic kidney disease: current regimen of irbesartan, carvedilol and amlodipine. Home regimen of olmesartan instead of irbesartan. Also prescribed Midodrine to prevent hypotension during dialysis.   -Blood pressure 155/67.   Diabetes mellitus type II with chronic kidney disease: insulin dependent.  Glucose elevated at times. Primary team to manage sliding scale insulin.  Secondary Hyperparathyroidism  -Calcium and phosphorus within desired range. - Continue sevelamer with meals  Anemia of chronic kidney disease: hemoglobin 7.9, slowly improving -Continue EPO with dialysis   LOS: 2   5/23/20244:45 PM

## 2023-04-26 NOTE — TOC CM/SW Note (Signed)
CSW went by room for readmission screen but patient has transferred to progressive unit. Went by new room but numerous family and staff outside. Will try to follow up tomorrow.  Charlynn Court, CSW [phone]

## 2023-04-26 NOTE — Progress Notes (Signed)
Date of Admission:  04/23/2023      ID: KHAWLA BROADAWAY is a 67 y.o. female  Principal Problem:   Acute on chronic respiratory failure with hypoxia ([name]) Active Problems:   S/P MVR (mitral valve replacement)   PAF (paroxysmal atrial fibrillation) ([name])   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, W, F   Type 2 diabetes mellitus with chronic kidney disease, with long-term current use of insulin ([name])   Chronic anticoagulation   Tracheostomy status ([name])   CAP (community acquired pneumonia)   Acute on chronic diastolic CHF (congestive heart failure) ([name])   Essential hypertension   Overweight (BMI 25.0-29.9)   Acute on chronic diastolic (congestive) heart failure ([name])   Pseudomonas respiratory infection    Subjective: Feeling better Less cough Less shortness of breath  Medications:   amLODipine  10 mg Oral Daily   apixaban  2.5 mg Oral BID   aspirin EC  81 mg Oral Daily   atorvastatin  80 mg Oral q1800   carvedilol  25 mg Oral BID   Chlorhexidine Gluconate Cloth  6 each Topical Q0600   diphenhydrAMINE  12.5 mg Oral Once   epoetin (EPOGEN/PROCRIT) injection  10,000 Units Intravenous Q M,W,F-HD   escitalopram  10 mg Oral Daily   gabapentin  300 mg Oral QHS   insulin aspart  0-5 Units Subcutaneous QHS   insulin aspart  0-6 Units Subcutaneous TID WC   insulin detemir  4 Units Subcutaneous BID   ipratropium-albuterol  3 mL Nebulization Q6H   irbesartan  150 mg Oral Daily   midodrine  5 mg Oral Once per day on Mon Wed Fri   multivitamin  1 tablet Oral QHS   pantoprazole  40 mg Oral Daily   sevelamer carbonate  1,600 mg Oral TID WC   traZODone  50 mg Oral Once per day on Mon Wed Fri    Objective: Vital signs in last 24 hours: Patient Vitals for the past 24 hrs:  BP Temp Temp src Pulse Resp SpO2  04/26/23 1206 (!) 127/58 98.6 F (37 C) -- 86 18 97 %  04/26/23 1132 (!) 155/67 -- -- 85 19 98 %  04/26/23 1000 -- 98.4 F (36.9 C) Oral 82 -- 99 %  04/26/23 0819 -- -- -- -- -- 92 %   04/26/23 0805 122/60 99.4 F (37.4 C) Oral 76 16 96 %  04/26/23 0447 129/68 99 F (37.2 C) Oral 73 16 95 %  04/26/23 0141 -- -- -- -- -- 94 %  04/26/23 0018 (!) 131/59 98.5 F (36.9 C) Oral 76 20 93 %  04/25/23 2059 -- -- -- -- -- 90 %  04/25/23 1643 (!) 119/55 99.1 F (37.3 C) Oral 80 18 91 %       PHYSICAL EXAM:  General: Alert, cooperative, no distress, appears stated age.  Tracheostomy Lips, mucosa, and tongue normal. No Thrush Neck: e, symmetrical, no adenopathy, thyroid: non tender no carotid bruit and no JVD. Back: No CVA tenderness. Lungs: Clear air entry. Few basal crypts heart: Regular rate and rhythm, no murmur, rub or gallop. Abdomen: Soft, non-tender,not distended. Bowel sounds normal. No masses Extremities: left UE AVG Skin: No rashes or lesions. Or bruising Lymph: Cervical, supraclavicular normal. Neurologic: Grossly non-focal  Lab Results    Latest Ref Rng & Units 04/26/2023    9:03 AM 04/25/2023    6:59 AM 04/24/2023    4:43 AM  CBC  WBC 4.0 - 10.5 K/uL 9.6  10.3  12.1   Hemoglobin 12.0 - 15.0 g/dL 7.9  7.4  8.0   Hematocrit 36.0 - 46.0 % 23.2  22.6  23.6   Platelets 150 - 400 K/uL 216  198  193        Latest Ref Rng & Units 04/26/2023    9:03 AM 04/25/2023    6:59 AM 04/24/2023    4:43 AM  CMP  Glucose 70 - 99 mg/dL 409  811  914   BUN 8 - 23 mg/dL 31  47  31   Creatinine 0.44 - 1.00 mg/dL 7.82  9.56  2.13   Sodium 135 - 145 mmol/L 136  139  136   Potassium 3.5 - 5.1 mmol/L 4.3  4.5  4.4   Chloride 98 - 111 mmol/L 97  102  98   CO2 22 - 32 mmol/L 28  27  29    Calcium 8.9 - 10.3 mg/dL 8.8  8.7  8.4   Total Protein 6.5 - 8.1 g/dL   6.2   Total Bilirubin 0.3 - 1.2 mg/dL   1.5   Alkaline Phos 38 - 126 U/L   65   AST 15 - 41 U/L   17   ALT 0 - 44 U/L   21       Microbiology: 04/24/23 BC - NG so far  Studies/Results: ECHOCARDIOGRAM COMPLETE  Result Date: 04/26/2023    ECHOCARDIOGRAM REPORT   Patient Name:   DARNAE BERNAT Date of Exam:  04/26/2023 Medical Rec #:  [id number]     Height:       63.0 in Accession #:    [id number]    Weight:       142.9 lb Date of Birth:  01/17/1956     BSA:          1.676 m Patient Age:    66 years      BP:           129/68 mmHg Patient Gender: F             HR:           73 bpm. Exam Location:  [facility] Procedure: 2D Echo, Cardiac Doppler and Color Doppler Indications:     CHF                  s/p Mitral valve replacement Z95.2  History:         Patient has prior history of Echocardiogram examinations, most                  recent 01/04/2022. Risk Factors:Diabetes and Hypertension. ESRD,                  Mitral valve replacement.  Sonographer:     Cristela Blue Referring Phys:  [id number] Lynn Ito Diagnosing Phys: Julien Nordmann MD IMPRESSIONS  1. Left ventricular ejection fraction, by estimation, is 55 to 60%. The left ventricle has normal function. The left ventricle has no regional wall motion abnormalities. There is moderate left ventricular hypertrophy. Left ventricular diastolic parameters are consistent with Grade II diastolic dysfunction (pseudonormalization).  2. Right ventricular systolic function is normal. The right ventricular size is normal. There is severely elevated pulmonary artery systolic pressure. The estimated right ventricular systolic pressure is 82.8 mmHg.  3. The mitral valve has been repaired/replaced. No evidence of mitral valve regurgitation. No evidence of mitral stenosis. The mean mitral valve gradient is 9.0 mmHg.  4. Tricuspid valve regurgitation is mild to  moderate.  5. The aortic valve has an indeterminant number of cusps. Aortic valve regurgitation is not visualized. Aortic valve sclerosis is present, with no evidence of aortic valve stenosis.  6. The inferior vena cava is normal in size with greater than 50% respiratory variability, suggesting right atrial pressure of 3 mmHg. FINDINGS  Left Ventricle: Left ventricular ejection fraction, by estimation, is 55 to 60%. The left ventricle  has normal function. The left ventricle has no regional wall motion abnormalities. The left ventricular internal cavity size was normal in size. There is  moderate left ventricular hypertrophy. Left ventricular diastolic parameters are consistent with Grade II diastolic dysfunction (pseudonormalization). Right Ventricle: The right ventricular size is normal. No increase in right ventricular wall thickness. Right ventricular systolic function is normal. There is severely elevated pulmonary artery systolic pressure. The tricuspid regurgitant velocity is 4.41 m/s, and with an assumed right atrial pressure of 5 mmHg, the estimated right ventricular systolic pressure is 82.8 mmHg. Left Atrium: Left atrial size was normal in size. Right Atrium: Right atrial size was normal in size. Pericardium: There is no evidence of pericardial effusion. Mitral Valve: The mitral valve has been repaired/replaced. No evidence of mitral valve regurgitation. 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 No evidence of mitral valve stenosis. MV peak gradient, 18.8 mmHg. The mean mitral valve gradient is 9.0 mmHg. Tricuspid Valve: The tricuspid valve is normal in structure. Tricuspid valve regurgitation is mild to moderate. No evidence of tricuspid stenosis. Aortic Valve: The aortic valve has an indeterminant number of cusps. Aortic valve regurgitation is not visualized. Aortic valve sclerosis is present, with no evidence of aortic valve stenosis. Aortic valve mean gradient measures 4.5 mmHg. Aortic valve peak gradient measures 8.7 mmHg. Aortic valve area, by VTI measures 2.20 cm. Pulmonic Valve: The pulmonic valve was normal in structure. Pulmonic valve regurgitation is not visualized. No evidence of pulmonic stenosis. Aorta: The aortic root is normal in size and structure. Venous: The inferior vena cava is normal in size with greater than 50% respiratory variability, suggesting right atrial pressure of 3 mmHg. IAS/Shunts:  No atrial level shunt detected by color flow Doppler.  LEFT VENTRICLE PLAX 2D LVIDd:         3.90 cm   Diastology LVIDs:         2.30 cm   LV e' medial:    7.40 cm/s LV PW:         1.60 cm   LV E/e' medial:  24.2 LV IVS:        1.50 cm   LV e' lateral:   7.94 cm/s LVOT diam:     2.10 cm   LV E/e' lateral: 22.5 LV SV:         62 LV SV Index:   37 LVOT Area:     3.46 cm  RIGHT VENTRICLE RV Basal diam:  4.50 cm RV Mid diam:    3.40 cm RV S prime:     11.60 cm/s TAPSE (M-mode): 2.6 cm LEFT ATRIUM           Index        RIGHT ATRIUM           Index LA diam:      3.70 cm 2.21 cm/m   RA Area:     20.70 cm LA Vol (A2C): 14.1 ml 8.41 ml/m   RA Volume:   65.80 ml  39.26 ml/m LA Vol (A4C): 30.9 ml 18.44 ml/m  AORTIC  VALVE AV Area (Vmax):    1.97 cm AV Area (Vmean):   2.14 cm AV Area (VTI):     2.20 cm AV Vmax:           147.50 cm/s AV Vmean:          97.250 cm/s AV VTI:            0.282 m AV Peak Grad:      8.7 mmHg AV Mean Grad:      4.5 mmHg LVOT Vmax:         83.80 cm/s LVOT Vmean:        60.000 cm/s LVOT VTI:          0.179 m LVOT/AV VTI ratio: 0.63  AORTA Ao Root diam: 3.10 cm MITRAL VALVE                TRICUSPID VALVE MV Area (PHT): 2.94 cm     TR Peak grad:   77.8 mmHg MV Area VTI:   1.06 cm     TR Vmax:        441.00 cm/s MV Peak grad:  18.8 mmHg MV Mean grad:  9.0 mmHg     SHUNTS MV Vmax:       2.17 m/s     Systemic VTI:  0.18 m MV Vmean:      140.0 cm/s   Systemic Diam: 2.10 cm MV Decel Time: 258 msec MV E velocity: 179.00 cm/s MV A velocity: 149.00 cm/s MV E/A ratio:  1.20 Julien Nordmann MD Electronically signed by Julien Nordmann MD Signature Date/Time: 04/26/2023/12:58:36 PM    Final      Assessment/Plan:  Acute hypoxic resp failure Pulmonary edema/CHF Because of MVR will get 2 d echo to assess valve Pseudomonas in -tracheal aspirate, because of SOB, recurrent hospitalization  - will treat as pseudomonas resp infection with cefepime for 10 days-total.  Can be given during dialysis.  End date  would be 04/29/2023  resp panel PCR negative   ESRD on dialysis   Tracheostomy since 2017   MVR- prosthetic valve- 2 d echo to evaluate the functioning of the valve.  This revealed normal valves.  EF of 55-60.  Ventricular diastolic dysfunction   bc 1 of 4 gram positive rod from last admission- final ID still pending.  Could be a contaminant.   repeat Blood culture NG so far ? CAD?  Discussed the management with patient. ID will sign off.  Call if needed.

## 2023-04-26 NOTE — Progress Notes (Signed)
Patient noted desatting at 84% on Room Air with 10 lpm via trach and a good pleth. Patient has been educated by RN and RT to not pull the oxygen away from her trach. Patient trach collar tightened by RT and patient continued to remain at 86%. Patient has had one similar episode earlier this shift, got order for continuous pulse ox at that time. MD aware and upgrading level of care when a bed is available on PCU.   Cornell Barman Korben Carcione

## 2023-04-26 NOTE — Progress Notes (Signed)
       CROSS COVER NOTE  NAME: Deborah [name] MRN: [id number] DOB : [birth year]    HPI/Events of Note   Report:generalized itching and vaginal discharge, no rash  On review of chart: HPI reviewed    Assessment and  Interventions   Assessment:  Plan: 12.5 mg oral benadryl  x 1 Self collect or nurse collect vaginal swab for wet prep       Donnie Mesa NP [facility] Cross Cover 7pm-7am - check amion for availability Pager [phone]

## 2023-04-26 NOTE — Progress Notes (Signed)
[facility]  PROGRESS NOTE  Deborah [name]:[id number] DOB: 28-Jan-1956 DOA: 04/23/2023 PCP: Si Gaul, MD   Brief HPI:   67 year old African-American female history of end-stage renal disease on dialysis Monday Wednesday Friday, paroxysmal atrial fibrillation on Eliquis, history of tracheostomy, chronic diastolic heart failure, chronic hypoxia at night, uncontrolled type 2 diabetes, hypertension, presents the ER today with worsening cough and hypoxia.  Patient was recently in the hospital and discharged 5/19 after treatment for pneumonia. Chest x-ray showed significant volume overload, patient was dialyzed after admission 5/20.     Assessment/Plan:    Acute on chronic respiratory failure with hypoxia -Tracheostomy status -CT chest showed vascular congestion/volume overload with acute on chronic diastolic heart failure -Nephrology consulted for dialysis -Concern for aspiration, patient has tracheostomy -Swallow evaluation obtained, recommend regular consistency diet with thin liquids, aspiration precautions -Will transfer to progressive care unit  Community-acquired pneumonia -Pseudomonas grew out of tracheal aspirate culture -Had been on cefepime during hospital stay -Show prescribed cefepime during previous admission, cefepime has been restarted -Will discharge on Levaquin when stable -Sputum culture also grew Corynebacterium species -ID was consulted; plan to get 2D echo to assess valve, patient has MVR  Diarrhea -Likely antibiotic induced -Obtain GI pathogen panel  ESRD -Patient on hemodialysis  Mitral valve replacement -2D echo ordered  Paroxysmal atrial fibrillation -Continue Eliquis, Coreg  Diabetes mellitus type 2 -Continue Levemir, sliding scale insulin with NovoLog -CBG well-controlled  Hypertension -Blood pressure is stable    Medications     amLODipine  10 mg Oral Daily   apixaban  2.5 mg Oral BID   aspirin EC  81 mg Oral Daily    atorvastatin  80 mg Oral q1800   carvedilol  25 mg Oral BID   Chlorhexidine Gluconate Cloth  6 each Topical Q0600   diphenhydrAMINE  12.5 mg Oral Once   epoetin (EPOGEN/PROCRIT) injection  10,000 Units Intravenous Q M,W,F-HD   escitalopram  10 mg Oral Daily   gabapentin  300 mg Oral QHS   insulin aspart  0-5 Units Subcutaneous QHS   insulin aspart  0-6 Units Subcutaneous TID WC   insulin detemir  4 Units Subcutaneous BID   ipratropium-albuterol  3 mL Nebulization Q6H   irbesartan  150 mg Oral Daily   midodrine  5 mg Oral Once per day on Mon Wed Fri   multivitamin  1 tablet Oral QHS   pantoprazole  40 mg Oral Daily   sevelamer carbonate  1,600 mg Oral TID WC   traZODone  50 mg Oral Once per day on Mon Wed Fri     Data Reviewed:   CBG:  Recent Labs  Lab 04/24/23 1716 04/24/23 2149 04/25/23 1344 04/25/23 1640 04/26/23 0023  GLUCAP 124* 168* 112* 163* 168*    SpO2: 95 % O2 Flow Rate (L/min): 10 L/min FiO2 (%): 50 %    Vitals:   04/25/23 2059 04/26/23 0018 04/26/23 0141 04/26/23 0447  BP:  (!) 131/59  129/68  Pulse:  76  73  Resp:  20  16  Temp:  98.5 F (36.9 C)  99 F (37.2 C)  TempSrc:  Oral  Oral  SpO2: 90% 93% 94% 95%  Weight:      Height:          Data Reviewed:  Basic Metabolic Panel: Recent Labs  Lab 04/20/23 0456 04/23/23 0930 04/24/23 0443 04/25/23 0659  NA 137 139 136 139  K 4.4 4.4 4.4 4.5  CL 98 102 98  102  CO2 29 24 29 27   GLUCOSE 110* 135* 148* 134*  BUN 33* 57* 31* 47*  CREATININE 5.49* 9.19* 5.95* 8.49*  CALCIUM 8.1* 8.8* 8.4* 8.7*  PHOS  --   --   --  3.8    CBC: Recent Labs  Lab 04/20/23 0456 04/23/23 0930 04/24/23 0443 04/25/23 0659  WBC 7.4 12.2* 12.1* 10.3  NEUTROABS  --  9.8* 10.4*  --   HGB 8.3* 8.4* 8.0* 7.4*  HCT 24.7* 25.3* 23.6* 22.6*  MCV 90.8 92.3 91.5 91.9  PLT 162 179 193 198    LFT Recent Labs  Lab 04/24/23 0443 04/25/23 0659  AST 17  --   ALT 21  --   ALKPHOS 65  --   BILITOT 1.5*  --   PROT  6.2*  --   ALBUMIN 3.2* 2.9*     Antibiotics: Anti-infectives (From admission, onward)    Start     Dose/Rate Route Frequency Ordered Stop   04/23/23 2200  ceFEPIme (MAXIPIME) 1 g in sodium chloride 0.9 % 100 mL IVPB        1 g 200 mL/hr over 30 Minutes Intravenous Every 24 hours 04/23/23 2123     04/23/23 2015  ceFEPIme (MAXIPIME) 2 g in sodium chloride 0.9 % 100 mL IVPB  Status:  Discontinued        2 g 200 mL/hr over 30 Minutes Intravenous  Once 04/23/23 2012 04/23/23 2123   04/23/23 2015  azithromycin (ZITHROMAX) 500 mg in sodium chloride 0.9 % 250 mL IVPB        500 mg 250 mL/hr over 60 Minutes Intravenous  Once 04/23/23 2012 04/24/23 0327        DVT prophylaxis: Apixaban  Code Status: Full code  Family Communication: No family at bedside   CONSULTS nephrology   Subjective    Complains of diarrhea.  O2 sats dropped to 80s this morning.  Improved after increasing oxygen.  Objective    Physical Examination:  General-appears in no acute distress Neck-tracheostomy in place Heart-S1-S2, regular, no murmur auscultated Lungs-clear to auscultation bilaterally, no wheezing or crackles auscultated Abdomen-soft, nontender, no organomegaly Extremities-no edema in the lower extremities Neuro-alert, oriented x3, no focal deficit noted   Status is: Inpatient:             [name]   [facility] If 7PM-7AM, please contact night-coverage at [email or web address], Office  [phone]   04/26/2023, 7:51 AM  LOS: 2 days

## 2023-04-26 NOTE — Progress Notes (Signed)
*  PRELIMINARY RESULTS* Echocardiogram 2D Echocardiogram has been performed.  Deborah [name] 04/26/2023, 7:52 AM

## 2023-04-27 DIAGNOSIS — J189 Pneumonia, unspecified organism: Secondary | ICD-10-CM | POA: Diagnosis not present

## 2023-04-27 DIAGNOSIS — I5033 Acute on chronic diastolic (congestive) heart failure: Secondary | ICD-10-CM | POA: Diagnosis not present

## 2023-04-27 DIAGNOSIS — J9621 Acute and chronic respiratory failure with hypoxia: Secondary | ICD-10-CM | POA: Diagnosis not present

## 2023-04-27 DIAGNOSIS — Z7901 Long term (current) use of anticoagulants: Secondary | ICD-10-CM | POA: Diagnosis not present

## 2023-04-27 LAB — BASIC METABOLIC PANEL
Anion gap: 9 (ref 5–15)
BUN: 43 mg/dL — ABNORMAL HIGH (ref 8–23)
CO2: 28 mmol/L (ref 22–32)
Calcium: 8.4 mg/dL — ABNORMAL LOW (ref 8.9–10.3)
Chloride: 97 mmol/L — ABNORMAL LOW (ref 98–111)
Creatinine, Ser: 8.1 mg/dL — ABNORMAL HIGH (ref 0.44–1.00)
GFR, Estimated: 5 mL/min — ABNORMAL LOW (ref >60)
Glucose, Bld: 167 mg/dL — ABNORMAL HIGH (ref 70–99)
Potassium: 4.4 mmol/L (ref 3.5–5.1)
Sodium: 134 mmol/L — ABNORMAL LOW (ref 135–145)

## 2023-04-27 LAB — GLUCOSE, CAPILLARY
Glucose-Capillary: 184 mg/dL — ABNORMAL HIGH (ref 70–99)
Glucose-Capillary: 200 mg/dL — ABNORMAL HIGH (ref 70–99)
Glucose-Capillary: 156 mg/dL — ABNORMAL HIGH (ref 70–99)

## 2023-04-27 LAB — MISC LABCORP TEST (SEND OUT)
LabCorp test name: 8664
Labcorp test code: 8664

## 2023-04-27 LAB — CBC
HCT: 22.2 % — ABNORMAL LOW (ref 36.0–46.0)
Hemoglobin: 7.4 g/dL — ABNORMAL LOW (ref 12.0–15.0)
MCH: 30.5 pg (ref 26.0–34.0)
MCHC: 33.3 g/dL (ref 30.0–36.0)
MCV: 91.4 fL (ref 80.0–100.0)
Platelets: 211 10*3/uL (ref 150–400)
RBC: 2.43 MIL/uL — ABNORMAL LOW (ref 3.87–5.11)
RDW: 15.3 % (ref 11.5–15.5)
WBC: 11.2 10*3/uL — ABNORMAL HIGH (ref 4.0–10.5)
nRBC: 0 % (ref 0.0–0.2)

## 2023-04-27 LAB — CULTURE, BLOOD (ROUTINE X 2): Culture: NO GROWTH

## 2023-04-27 MED ORDER — EPOETIN ALFA 10000 UNIT/ML IJ SOLN
INTRAMUSCULAR | Status: AC
  Filled 2023-04-27: qty 1

## 2023-04-27 MED ORDER — SODIUM CHLORIDE 0.9 % IV SOLN: INTRAVENOUS | Status: DC | PRN

## 2023-04-27 NOTE — Progress Notes (Signed)
[facility]  ROUNDING NOTE   Subjective:   Ms. GIANNAH KUILAN returns to [facility] on 04/23/2023 for Acute on chronic respiratory failure with hypoxia ([name]) [J96.21] Acute on chronic diastolic (congestive) heart failure ([name]) [I50.33]  Patient is known to our practice from previous admissions and receives outpatient dialysis at [facility] on a MWF schedule, supervised by [facility] physicians.  Patient seen and evaluated during dialysis   HEMODIALYSIS FLOWSHEET:  Blood Flow Rate (mL/min): 400 mL/min Arterial Pressure (mmHg): -140 mmHg Venous Pressure (mmHg): 170 mmHg TMP (mmHg): 10 mmHg Ultrafiltration Rate (mL/min): 685 mL/min Dialysate Flow Rate (mL/min): 300 ml/min   Trach collar weaned to  8 L   Objective:  Vital signs in last 24 hours:  Temp:  [97.3 F (36.3 C)-98.9 F (37.2 C)] 98.7 F (37.1 C) (05/24 1321) Pulse Rate:  [76-93] 88 (05/24 1321) Resp:  [16-24] 19 (05/24 1321) BP: (103-138)/(34-69) 128/65 (05/24 1321) SpO2:  [86 %-100 %] 99 % (05/24 1321) FiO2 (%):  [40 %-50 %] 40 % (05/24 0757) Weight:  [65.8 kg-71.1 kg] 65.8 kg (05/24 0918)  Weight change: 5.979 kg Filed Weights   04/25/23 1232 04/27/23 0500 04/27/23 0918  Weight: 64.8 kg 71.1 kg 65.8 kg    Intake/Output: No intake/output data recorded.   Intake/Output this shift:  Total I/O In: -  Out: 1500 [Other:1500]  Physical Exam: General: NAD, laying in bed  Head: Normocephalic, atraumatic. Moist oral mucosal membranes  Eyes: Anicteric  Neck: +tracheostomy  Lungs:  Rhonchi bilaterally   Heart: Regular rate and rhythm  Abdomen:  Soft, nontender  Extremities:  no peripheral edema.  Neurologic: Nonfocal, moving all four extremities  Skin: No lesions  Access: Left AVG    Basic Metabolic Panel: Recent Labs  Lab 04/23/23 0930 04/24/23 0443 04/25/23 0659 04/26/23 0903 04/27/23 0416  NA 139 136 139 136 134*  K 4.4 4.4 4.5 4.3 4.4  CL 102 98 102 97* 97*  CO2 24 29 27 28 28    GLUCOSE 135* 148* 134* 113* 167*  BUN 57* 31* 47* 31* 43*  CREATININE 9.19* 5.95* 8.49* 6.29* 8.10*  CALCIUM 8.8* 8.4* 8.7* 8.8* 8.4*  PHOS  --   --  3.8  --   --      Liver Function Tests: Recent Labs  Lab 04/24/23 0443 04/25/23 0659  AST 17  --   ALT 21  --   ALKPHOS 65  --   BILITOT 1.5*  --   PROT 6.2*  --   ALBUMIN 3.2* 2.9*    No results for input(s): "LIPASE", "AMYLASE" in the last 168 hours. No results for input(s): "AMMONIA" in the last 168 hours.  CBC: Recent Labs  Lab 04/23/23 0930 04/24/23 0443 04/25/23 0659 04/26/23 0903 04/27/23 0416  WBC 12.2* 12.1* 10.3 9.6 11.2*  NEUTROABS 9.8* 10.4*  --   --   --   HGB 8.4* 8.0* 7.4* 7.9* 7.4*  HCT 25.3* 23.6* 22.6* 23.2* 22.2*  MCV 92.3 91.5 91.9 89.6 91.4  PLT 179 193 198 216 211     Cardiac Enzymes: No results for input(s): "CKTOTAL", "CKMB", "CKMBINDEX", "TROPONINI" in the last 168 hours.  BNP: Invalid input(s): "POCBNP"  CBG: Recent Labs  Lab 04/26/23 0927 04/26/23 1209 04/26/23 1701 04/26/23 2112 04/27/23 0847  GLUCAP 124* 210* 183* 130* 184*     Microbiology: Results for orders placed or performed during the hospital encounter of 04/23/23  SARS Coronavirus 2 by RT PCR (hospital order, performed in [facility] hospital  lab) *cepheid single result test* Anterior Nasal Swab     Status: None   Collection Time: 04/23/23  9:30 AM   Specimen: Anterior Nasal Swab  Result Value Ref Range Status   SARS Coronavirus 2 by RT PCR NEGATIVE NEGATIVE Final    Comment: (NOTE) SARS-CoV-2 target nucleic acids are NOT DETECTED.  The SARS-CoV-2 RNA is generally detectable in upper and lower respiratory specimens during the acute phase of infection. The lowest concentration of SARS-CoV-2 viral copies this assay can detect is 250 copies / mL. A negative result does not preclude SARS-CoV-2 infection and should not be used as the sole basis for treatment or other patient management decisions.  A negative  result may occur with improper specimen collection / handling, submission of specimen other than nasopharyngeal swab, presence of viral mutation(s) within the areas targeted by this assay, and inadequate number of viral copies (<250 copies / mL). A negative result must be combined with clinical observations, patient history, and epidemiological information.  Fact Sheet for Patients:   RoadLapTop.co.za  Fact Sheet for Healthcare Providers: http://kim-miller.com/  This test is not yet approved or  cleared by the [place] FDA and has been authorized for detection and/or diagnosis of SARS-CoV-2 by FDA under an Emergency Use Authorization (EUA).  This EUA will remain in effect (meaning this test can be used) for the duration of the COVID-19 declaration under Section 564(b)(1) of the Act, 21 U.S.C. section 360bbb-3(b)(1), unless the authorization is terminated or revoked sooner.  Performed at [facility], [address] [place], [place] [address]   Culture, blood (Routine X 2) w Reflex to ID Panel     Status: None (Preliminary result)   Collection Time: 04/24/23  7:52 PM   Specimen: BLOOD  Result Value Ref Range Status   Specimen Description BLOOD RIGHT ANTECUBITAL  Final   Special Requests   Final    BOTTLES DRAWN AEROBIC AND ANAEROBIC Blood Culture adequate volume   Culture   Final    NO GROWTH 3 DAYS Performed at [facility], [address] [place], [place] [address]    Report Status PENDING  Incomplete  Culture, blood (Routine X 2) w Reflex to ID Panel     Status: None (Preliminary result)   Collection Time: 04/24/23  7:56 PM   Specimen: BLOOD  Result Value Ref Range Status   Specimen Description BLOOD RIGHT ANTECUBITAL  Final   Special Requests   Final    BOTTLES DRAWN AEROBIC AND ANAEROBIC Blood Culture adequate volume   Culture   Final    NO GROWTH 3 DAYS Performed at [facility], [address] [place], [place] [address]    Report Status PENDING  Incomplete  Respiratory (~20 pathogens) panel by PCR     Status: None   Collection Time: 04/25/23  2:01 PM   Specimen: Nasopharyngeal Swab; Respiratory  Result Value Ref Range Status   Adenovirus NOT DETECTED NOT DETECTED Final   Coronavirus 229E NOT DETECTED NOT DETECTED Final    Comment: (NOTE) The Coronavirus on the Respiratory Panel, DOES NOT test for the novel  Coronavirus (2019 nCoV)    Coronavirus HKU1 NOT DETECTED NOT DETECTED Final   Coronavirus NL63 NOT DETECTED NOT DETECTED Final   Coronavirus OC43 NOT DETECTED NOT DETECTED Final   Metapneumovirus NOT DETECTED NOT DETECTED Final   Rhinovirus / Enterovirus NOT DETECTED NOT DETECTED Final   Influenza A NOT DETECTED NOT DETECTED Final   Influenza B NOT DETECTED  NOT DETECTED Final   Parainfluenza Virus 1 NOT DETECTED NOT DETECTED Final   Parainfluenza Virus 2 NOT DETECTED NOT DETECTED Final   Parainfluenza Virus 3 NOT DETECTED NOT DETECTED Final   Parainfluenza Virus 4 NOT DETECTED NOT DETECTED Final   Respiratory Syncytial Virus NOT DETECTED NOT DETECTED Final   Bordetella pertussis NOT DETECTED NOT DETECTED Final   Bordetella Parapertussis NOT DETECTED NOT DETECTED Final   Chlamydophila pneumoniae NOT DETECTED NOT DETECTED Final   Mycoplasma pneumoniae NOT DETECTED NOT DETECTED Final    Comment: Performed at [facility], [address] [place], [place] [address]    Coagulation Studies: No results for input(s): "LABPROT", "INR" in the last 72 hours.   Urinalysis: No results for input(s): "COLORURINE", "LABSPEC", "PHURINE", "GLUCOSEU", "HGBUR", "BILIRUBINUR", "KETONESUR", "PROTEINUR", "UROBILINOGEN", "NITRITE", "LEUKOCYTESUR" in the last 72 hours.  Invalid input(s): "APPERANCEUR"    Imaging: ECHOCARDIOGRAM COMPLETE  Result Date: 04/26/2023    ECHOCARDIOGRAM REPORT   Patient Name:   MEITAL MERRETT Date of Exam: 04/26/2023 Medical Rec #:   [id number]     Height:       63.0 in Accession #:    [id number]    Weight:       142.9 lb Date of Birth:  Nov 02, 1956     BSA:          1.676 m Patient Age:    66 years      BP:           129/68 mmHg Patient Gender: F             HR:           73 bpm. Exam Location:  [facility] Procedure: 2D Echo, Cardiac Doppler and Color Doppler Indications:     CHF                  s/p Mitral valve replacement Z95.2  History:         Patient has prior history of Echocardiogram examinations, most                  recent 01/04/2022. Risk Factors:Diabetes and Hypertension. ESRD,                  Mitral valve replacement.  Sonographer:     Cristela Blue Referring Phys:  [id number] Lynn Ito Diagnosing Phys: Julien Nordmann MD IMPRESSIONS  1. Left ventricular ejection fraction, by estimation, is 55 to 60%. The left ventricle has normal function. The left ventricle has no regional wall motion abnormalities. There is moderate left ventricular hypertrophy. Left ventricular diastolic parameters are consistent with Grade II diastolic dysfunction (pseudonormalization).  2. Right ventricular systolic function is normal. The right ventricular size is normal. There is severely elevated pulmonary artery systolic pressure. The estimated right ventricular systolic pressure is 82.8 mmHg.  3. The mitral valve has been repaired/replaced. No evidence of mitral valve regurgitation. No evidence of mitral stenosis. The mean mitral valve gradient is 9.0 mmHg.  4. Tricuspid valve regurgitation is mild to moderate.  5. The aortic valve has an indeterminant number of cusps. Aortic valve regurgitation is not visualized. Aortic valve sclerosis is present, with no evidence of aortic valve stenosis.  6. The inferior vena cava is normal in size with greater than 50% respiratory variability, suggesting right atrial pressure of 3 mmHg. FINDINGS  Left Ventricle: Left ventricular ejection fraction, by estimation, is 55 to 60%. The left ventricle has normal function. The  left ventricle has no  regional wall motion abnormalities. The left ventricular internal cavity size was normal in size. There is  moderate left ventricular hypertrophy. Left ventricular diastolic parameters are consistent with Grade II diastolic dysfunction (pseudonormalization). Right Ventricle: The right ventricular size is normal. No increase in right ventricular wall thickness. Right ventricular systolic function is normal. There is severely elevated pulmonary artery systolic pressure. The tricuspid regurgitant velocity is 4.41 m/s, and with an assumed right atrial pressure of 5 mmHg, the estimated right ventricular systolic pressure is 82.8 mmHg. Left Atrium: Left atrial size was normal in size. Right Atrium: Right atrial size was normal in size. Pericardium: There is no evidence of pericardial effusion. Mitral Valve: The mitral valve has been repaired/replaced. No evidence of mitral valve regurgitation. 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 No evidence of mitral valve stenosis. MV peak gradient, 18.8 mmHg. The mean mitral valve gradient is 9.0 mmHg. Tricuspid Valve: The tricuspid valve is normal in structure. Tricuspid valve regurgitation is mild to moderate. No evidence of tricuspid stenosis. Aortic Valve: The aortic valve has an indeterminant number of cusps. Aortic valve regurgitation is not visualized. Aortic valve sclerosis is present, with no evidence of aortic valve stenosis. Aortic valve mean gradient measures 4.5 mmHg. Aortic valve peak gradient measures 8.7 mmHg. Aortic valve area, by VTI measures 2.20 cm. Pulmonic Valve: The pulmonic valve was normal in structure. Pulmonic valve regurgitation is not visualized. No evidence of pulmonic stenosis. Aorta: The aortic root is normal in size and structure. Venous: The inferior vena cava is normal in size with greater than 50% respiratory variability, suggesting right atrial pressure of 3 mmHg. IAS/Shunts: No atrial level shunt  detected by color flow Doppler.  LEFT VENTRICLE PLAX 2D LVIDd:         3.90 cm   Diastology LVIDs:         2.30 cm   LV e' medial:    7.40 cm/s LV PW:         1.60 cm   LV E/e' medial:  24.2 LV IVS:        1.50 cm   LV e' lateral:   7.94 cm/s LVOT diam:     2.10 cm   LV E/e' lateral: 22.5 LV SV:         62 LV SV Index:   37 LVOT Area:     3.46 cm  RIGHT VENTRICLE RV Basal diam:  4.50 cm RV Mid diam:    3.40 cm RV S prime:     11.60 cm/s TAPSE (M-mode): 2.6 cm LEFT ATRIUM           Index        RIGHT ATRIUM           Index LA diam:      3.70 cm 2.21 cm/m   RA Area:     20.70 cm LA Vol (A2C): 14.1 ml 8.41 ml/m   RA Volume:   65.80 ml  39.26 ml/m LA Vol (A4C): 30.9 ml 18.44 ml/m  AORTIC VALVE AV Area (Vmax):    1.97 cm AV Area (Vmean):   2.14 cm AV Area (VTI):     2.20 cm AV Vmax:           147.50 cm/s AV Vmean:          97.250 cm/s AV VTI:            0.282 m AV Peak Grad:      8.7 mmHg AV Mean  Grad:      4.5 mmHg LVOT Vmax:         83.80 cm/s LVOT Vmean:        60.000 cm/s LVOT VTI:          0.179 m LVOT/AV VTI ratio: 0.63  AORTA Ao Root diam: 3.10 cm MITRAL VALVE                TRICUSPID VALVE MV Area (PHT): 2.94 cm     TR Peak grad:   77.8 mmHg MV Area VTI:   1.06 cm     TR Vmax:        441.00 cm/s MV Peak grad:  18.8 mmHg MV Mean grad:  9.0 mmHg     SHUNTS MV Vmax:       2.17 m/s     Systemic VTI:  0.18 m MV Vmean:      140.0 cm/s   Systemic Diam: 2.10 cm MV Decel Time: 258 msec MV E velocity: 179.00 cm/s MV A velocity: 149.00 cm/s MV E/A ratio:  1.20 Julien Nordmann MD Electronically signed by Julien Nordmann MD Signature Date/Time: 04/26/2023/12:58:36 PM    Final      Medications:    ceFEPime (MAXIPIME) IV 1 g (04/26/23 2117)     amLODipine  10 mg Oral Daily   apixaban  2.5 mg Oral BID   aspirin EC  81 mg Oral Daily   atorvastatin  80 mg Oral q1800   carvedilol  25 mg Oral BID   Chlorhexidine Gluconate Cloth  6 each Topical Q0600   diphenhydrAMINE  12.5 mg Oral Once   epoetin  (EPOGEN/PROCRIT) injection  10,000 Units Intravenous Q M,W,F-HD   escitalopram  10 mg Oral Daily   gabapentin  300 mg Oral QHS   insulin aspart  0-5 Units Subcutaneous QHS   insulin aspart  0-6 Units Subcutaneous TID WC   insulin detemir  4 Units Subcutaneous BID   ipratropium-albuterol  3 mL Nebulization Q6H   irbesartan  150 mg Oral Daily   midodrine  5 mg Oral Once per day on Mon Wed Fri   multivitamin  1 tablet Oral QHS   pantoprazole  40 mg Oral Daily   sevelamer carbonate  1,600 mg Oral TID WC   traZODone  50 mg Oral Once per day on Mon Wed Fri   acetaminophen **OR** acetaminophen, guaiFENesin-dextromethorphan, ondansetron **OR** ondansetron (ZOFRAN) IV  Assessment/ Plan:  Ms. CONSEPCION ZENI is a 67 y.o.  female with end stage renal disease on hemodialysis, tracheostomy, hypertension, hyperlipidemia, diabetes mellitus type II insulin dependent, depression, and diabetic neuropathy who presents to [facility] for 04/23/2023 for Acute on chronic respiratory failure with hypoxia ([name]) [J96.21] Acute on chronic diastolic (congestive) heart failure ([name]) [I50.33]  [facility] MWF [address] Left AVG 62kg  End Stage Kidney Disease:  - Receiving dialysis today, UF 1.5L as tolerated. Next treatment scheduled for Monday  Hypertension with chronic kidney disease: current regimen of irbesartan, carvedilol and amlodipine. Home regimen of olmesartan instead of irbesartan. Also prescribed Midodrine to prevent hypotension during dialysis.   -Blood pressure 134/66 during dialysis   Diabetes mellitus type II with chronic kidney disease: insulin dependent.  Sliding scale managed by primary team.  Secondary Hyperparathyroidism  -Calcium accepted  - Continue sevelamer with meals  Anemia of chronic kidney disease: hemoglobin 7.4, slowly improving -Continue EPO with dialysis   LOS: 3   5/24/20241:44 PM

## 2023-04-27 NOTE — Progress Notes (Signed)
[facility]  PROGRESS NOTE  Deborah [name]:[id number] DOB: 16-Aug-1956 DOA: 04/23/2023 PCP: Si Gaul, MD   Brief HPI:   67 year old African-American female history of end-stage renal disease on dialysis Monday Wednesday Friday, paroxysmal atrial fibrillation on Eliquis, history of tracheostomy, chronic diastolic heart failure, chronic hypoxia at night, uncontrolled type 2 diabetes, hypertension, presents the ER today with worsening cough and hypoxia.  Patient was recently in the hospital and discharged 5/19 after treatment for pneumonia. Chest x-ray showed significant volume overload, patient was dialyzed after admission 5/20.     Assessment/Plan:    Acute on chronic respiratory failure with hypoxia -Tracheostomy status -CT chest showed vascular congestion/volume overload with acute on chronic diastolic heart failure -Nephrology consulted for dialysis -Concern for aspiration, patient has tracheostomy -Swallow evaluation obtained, recommend regular consistency diet with thin liquids, aspiration precautions -transfer to progressive care unit  Community-acquired pneumonia -Had been on cefepime during hospital stay -Show prescribed cefepime during previous admission, cefepime has been restarted -Will discharge on Levaquin when stable -Sputum culture also grew Corynebacterium species -ID was consulted;  2 d echo obtained to evaluate the functioning of the valve.  This revealed normal valves.  EF of 55-60.  Ventricular diastolic dysfunction  -Pseudomonas in -tracheal aspirate, because of SOB, recurrent hospitalization  - will treat as pseudomonas resp infection with cefepime for 10 days-total.  Can be given during dialysis.  End date would be 04/29/2023   Diarrhea -Likely antibiotic induced -Obtain GI pathogen panel  ESRD -Patient on hemodialysis  Mitral valve replacement -2D echo ordered  Paroxysmal atrial fibrillation -Continue Eliquis, Coreg  Diabetes mellitus  type 2 -Continue Levemir, sliding scale insulin with NovoLog -CBG well-controlled  Hypertension -Blood pressure is stable    Medications     amLODipine  10 mg Oral Daily   apixaban  2.5 mg Oral BID   aspirin EC  81 mg Oral Daily   atorvastatin  80 mg Oral q1800   carvedilol  25 mg Oral BID   Chlorhexidine Gluconate Cloth  6 each Topical Q0600   diphenhydrAMINE  12.5 mg Oral Once   epoetin (EPOGEN/PROCRIT) injection  10,000 Units Intravenous Q M,W,F-HD   escitalopram  10 mg Oral Daily   gabapentin  300 mg Oral QHS   insulin aspart  0-5 Units Subcutaneous QHS   insulin aspart  0-6 Units Subcutaneous TID WC   insulin detemir  4 Units Subcutaneous BID   ipratropium-albuterol  3 mL Nebulization Q6H   irbesartan  150 mg Oral Daily   midodrine  5 mg Oral Once per day on Mon Wed Fri   multivitamin  1 tablet Oral QHS   pantoprazole  40 mg Oral Daily   sevelamer carbonate  1,600 mg Oral TID WC   traZODone  50 mg Oral Once per day on Mon Wed Fri     Data Reviewed:   CBG:  Recent Labs  Lab 04/26/23 0023 04/26/23 0927 04/26/23 1209 04/26/23 1701 04/26/23 2112  GLUCAP 168* 124* 210* 183* 130*    SpO2: 94 % O2 Flow Rate (L/min): 12 L/min FiO2 (%): 50 %    Vitals:   04/26/23 2313 04/27/23 0231 04/27/23 0500 04/27/23 0627  BP: (!) 138/59   118/61  Pulse: 82   76  Resp: 20   18  Temp: 98.9 F (37.2 C)   98.9 F (37.2 C)  TempSrc:      SpO2: 96% 96%  94%  Weight:   71.1 kg   Height:  Data Reviewed:  Basic Metabolic Panel: Recent Labs  Lab 04/23/23 0930 04/24/23 0443 04/25/23 0659 04/26/23 0903 04/27/23 0416  NA 139 136 139 136 134*  K 4.4 4.4 4.5 4.3 4.4  CL 102 98 102 97* 97*  CO2 24 29 27 28 28   GLUCOSE 135* 148* 134* 113* 167*  BUN 57* 31* 47* 31* 43*  CREATININE 9.19* 5.95* 8.49* 6.29* 8.10*  CALCIUM 8.8* 8.4* 8.7* 8.8* 8.4*  PHOS  --   --  3.8  --   --     CBC: Recent Labs  Lab 04/23/23 0930 04/24/23 0443 04/25/23 0659  04/26/23 0903 04/27/23 0416  WBC 12.2* 12.1* 10.3 9.6 11.2*  NEUTROABS 9.8* 10.4*  --   --   --   HGB 8.4* 8.0* 7.4* 7.9* 7.4*  HCT 25.3* 23.6* 22.6* 23.2* 22.2*  MCV 92.3 91.5 91.9 89.6 91.4  PLT 179 193 198 216 211    LFT Recent Labs  Lab 04/24/23 0443 04/25/23 0659  AST 17  --   ALT 21  --   ALKPHOS 65  --   BILITOT 1.5*  --   PROT 6.2*  --   ALBUMIN 3.2* 2.9*     Antibiotics: Anti-infectives (From admission, onward)    Start     Dose/Rate Route Frequency Ordered Stop   04/23/23 2200  ceFEPIme (MAXIPIME) 1 g in sodium chloride 0.9 % 100 mL IVPB        1 g 200 mL/hr over 30 Minutes Intravenous Every 24 hours 04/23/23 2123 04/29/23 2359   04/23/23 2015  ceFEPIme (MAXIPIME) 2 g in sodium chloride 0.9 % 100 mL IVPB  Status:  Discontinued        2 g 200 mL/hr over 30 Minutes Intravenous  Once 04/23/23 2012 04/23/23 2123   04/23/23 2015  azithromycin (ZITHROMAX) 500 mg in sodium chloride 0.9 % 250 mL IVPB        500 mg 250 mL/hr over 60 Minutes Intravenous  Once 04/23/23 2012 04/24/23 0327        DVT prophylaxis: Apixaban  Code Status: Full code  Family Communication: No family at bedside   CONSULTS nephrology   Subjective    Patient seen, had 1 episode of vomiting last night.  Denies abdominal pain.  No vomiting since this morning.  Objective    Physical Examination:  Appears in no acute distress S1-S2, regular Lungs are clear to auscultation bilaterally Abdomen is soft, nontender, no organomegaly  Status is: Inpatient:             [name]   [facility] If 7PM-7AM, please contact night-coverage at [email or web address], Office  [phone]   04/27/2023, 8:08 AM  LOS: 3 days

## 2023-04-27 NOTE — Progress Notes (Signed)
  Received patient in bed to unit.   Informed consent signed and in chart.    TX duration: 3.5 Hrs     Transported back to floor  Hand-off given to patient's nurse. Pt c/o  not feeling well, floor nurse made aware    Access used: LAVG Access issues: none   Total UF removed: 1.5L Medication(s) given: 10,000u epo Post HD VS: 128/65 Post HD weight: 62.7kg     Lynann Beaver  [facility]

## 2023-04-27 NOTE — TOC CM/SW Note (Signed)
Received call from Rob with [facility]. Patient gets services 84 hours per week through them. Will keep them updated regarding discharge date.  Charlynn Court, CSW [phone]

## 2023-04-27 NOTE — TOC Initial Note (Signed)
Transition of Care [facility]) - Initial/Assessment Note    Patient Details  Name: Deborah [name] MRN: [id number] Date of Birth: [birth year]  Transition of Care [facility]) CM/SW Contact:    Margarito Liner, LCSW Phone Number: 04/27/2023, 4:22 PM  Clinical Narrative:  Readmission prevention screen complete. CSW met with patient. No supports at bedside. CSW introduced role and explained that discharge planning would be discussed. PCP is Brand Males, MD. She uses Medicaid Transportation to get to appointments. Pharmacy is [facility]. Unsure which one. No issues obtaining medications. Patient confirmed she has services through [facility]. Patient reports having DME at home. She is typically on 3 L oxygen through [employer]. No further concerns. CSW encouraged patient to contact CSW as needed. CSW will continue to follow patient for support and facilitate return home once stable. Patient said Medicaid Transportation would take her home at discharge but CSW unsure if this is accurate since they typically need a 3-day note for transport. When patient discharged on 5/19 she went home in an [place]. No further concerns. CSW encouraged patient to contact CSW as needed. CSW will continue to follow patient for support and facilitate return home once stable.                Expected Discharge Plan: Home w Home Health Services Barriers to Discharge: Continued Medical Work up   Patient Goals and CMS Choice            Expected Discharge Plan and Services     Post Acute Care Choice: Resumption of Svcs/PTA Provider Living arrangements for the past 2 months: Apartment                                      Prior Living Arrangements/Services Living arrangements for the past 2 months: Apartment   Patient language and need for interpreter reviewed:: Yes Do you feel safe going back to the place where you live?: Yes      Need for Family Participation in Patient Care: Yes (Comment) Care giver support  system in place?: Yes (comment) Current home services: DME, Home RN Criminal Activity/Legal Involvement Pertinent to Current Situation/Hospitalization: No - Comment as needed  Activities of Daily Living Home Assistive Devices/Equipment: Wheelchair ADL Screening (condition at time of admission) Patient's cognitive ability adequate to safely complete daily activities?: Yes Is the patient deaf or have difficulty hearing?: No Does the patient have difficulty seeing, even when wearing glasses/contacts?: No Does the patient have difficulty concentrating, remembering, or making decisions?: No Patient able to express need for assistance with ADLs?: No Does the patient have difficulty dressing or bathing?: No Independently performs ADLs?: Yes (appropriate for developmental age) Does the patient have difficulty walking or climbing stairs?: No Weakness of Legs: Both Weakness of Arms/Hands: Both  Permission Sought/Granted Permission sought to share information with : Facility Industrial/product designer granted to share information with : Yes, Verbal Permission Granted     Permission granted to share info w AGENCY: [facility]        Emotional Assessment Appearance:: Appears stated age Attitude/Demeanor/Rapport: Engaged Affect (typically observed): Accepting Orientation: : Oriented to Self, Oriented to Place, Oriented to  Time, Oriented to Situation Alcohol / Substance Use: Not Applicable Psych Involvement: No (comment)  Admission diagnosis:  Acute on chronic respiratory failure with hypoxia ([name]) [J96.21] Acute on chronic diastolic (congestive) heart failure ([name]) [I50.33] Patient Active Problem List  Diagnosis Date Noted   Pseudomonas respiratory infection 04/26/2023   Overweight (BMI 25.0-29.9) 04/24/2023   Acute on chronic diastolic (congestive) heart failure ([name]) 04/24/2023   Acute on chronic respiratory failure with hypoxia ([name]) 04/23/2023   Depression  04/18/2023   Dyslipidemia 04/18/2023   Essential hypertension 04/18/2023   Acute on chronic diastolic CHF (congestive heart failure) ([name]) 01/04/2022   Tracheostomy status ([name]) 11/01/2020   CAP (community acquired pneumonia) 11/01/2020   Diabetic neuropathy ([name]) 10/16/2017   Vocal cord dysfunction    Vocal cord paralysis    Chronic anticoagulation    Oropharyngeal dysphagia    Type 2 diabetes mellitus with chronic kidney disease, with long-term current use of insulin ([name])    Physical debility 10/11/2016   Debility    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, W, F    Anemia of chronic disease    Coronary artery disease involving native coronary artery of native heart without angina pectoris    Persistent cough    Dysphonia    Sleep disturbance    Diabetes mellitus type 2 in nonobese ([name])    Pressure injury of skin 09/18/2016   History of tracheostomy    History of MVR with cardiopulmonary bypass    PAF (paroxysmal atrial fibrillation) ([name])    S/P MVR (mitral valve replacement)    Mitral regurgitation 08/23/2016   Noncompliance 01/25/2015   PCP:  Si Gaul, MD Pharmacy:   [facility]  [address] [place] [address] Phone: [phone] Fax: [phone]  [place] [facility] [address] [place] [address] Phone: [phone] Fax: [phone]  [facility] [address] [place] - [address] [place] [address] Phone: [phone] Fax: [phone]  [facility] # [address] [place] - [address] [place] [address] Phone: [phone] Fax: [phone]     Social Determinants of Health (SDOH) Social History: SDOH Screenings   Food Insecurity: No Food Insecurity (04/24/2023)  Housing: Patient Declined (04/24/2023)  Transportation Needs: No Transportation Needs (04/24/2023)  Utilities: Not At Risk (04/24/2023)  Tobacco  Use: Low Risk  (04/24/2023)   SDOH Interventions:     Readmission Risk Interventions    04/27/2023    4:18 PM 11/04/2020    9:20 AM  Readmission Risk Prevention Plan  Transportation Screening Complete Complete  PCP or Specialist Appt within 3-5 Days Complete   HRI or Home Care Consult Complete   Social Work Consult for Recovery Care Planning/Counseling Complete   Palliative Care Screening Not Applicable   Medication Review Oceanographer) Complete   PCP or Specialist appointment within 3-5 days of discharge  Complete  SW Recovery Care/Counseling Consult  Complete  Palliative Care Screening  Not Applicable  Skilled Nursing Facility  Not Applicable

## 2023-04-28 DIAGNOSIS — J9621 Acute and chronic respiratory failure with hypoxia: Secondary | ICD-10-CM | POA: Diagnosis not present

## 2023-04-28 DIAGNOSIS — I5033 Acute on chronic diastolic (congestive) heart failure: Secondary | ICD-10-CM | POA: Diagnosis not present

## 2023-04-28 DIAGNOSIS — Z7901 Long term (current) use of anticoagulants: Secondary | ICD-10-CM | POA: Diagnosis not present

## 2023-04-28 DIAGNOSIS — J189 Pneumonia, unspecified organism: Secondary | ICD-10-CM | POA: Diagnosis not present

## 2023-04-28 LAB — BASIC METABOLIC PANEL
Anion gap: 10 (ref 5–15)
BUN: 22 mg/dL (ref 8–23)
CO2: 29 mmol/L (ref 22–32)
Calcium: 8.6 mg/dL — ABNORMAL LOW (ref 8.9–10.3)
Chloride: 98 mmol/L (ref 98–111)
Creatinine, Ser: 5.51 mg/dL — ABNORMAL HIGH (ref 0.44–1.00)
GFR, Estimated: 8 mL/min — ABNORMAL LOW (ref >60)
Glucose, Bld: 113 mg/dL — ABNORMAL HIGH (ref 70–99)
Potassium: 4.2 mmol/L (ref 3.5–5.1)
Sodium: 137 mmol/L (ref 135–145)

## 2023-04-28 LAB — CBC
HCT: 22.2 % — ABNORMAL LOW (ref 36.0–46.0)
Hemoglobin: 7.3 g/dL — ABNORMAL LOW (ref 12.0–15.0)
MCH: 30.2 pg (ref 26.0–34.0)
MCHC: 32.9 g/dL (ref 30.0–36.0)
MCV: 91.7 fL (ref 80.0–100.0)
Platelets: 186 10*3/uL (ref 150–400)
RBC: 2.42 MIL/uL — ABNORMAL LOW (ref 3.87–5.11)
RDW: 15.7 % — ABNORMAL HIGH (ref 11.5–15.5)
WBC: 10.5 10*3/uL (ref 4.0–10.5)
nRBC: 0 % (ref 0.0–0.2)

## 2023-04-28 LAB — CULTURE, BLOOD (ROUTINE X 2): Special Requests: ADEQUATE

## 2023-04-28 LAB — GLUCOSE, CAPILLARY
Glucose-Capillary: 99 mg/dL (ref 70–99)
Glucose-Capillary: 205 mg/dL — ABNORMAL HIGH (ref 70–99)
Glucose-Capillary: 156 mg/dL — ABNORMAL HIGH (ref 70–99)
Glucose-Capillary: 301 mg/dL — ABNORMAL HIGH (ref 70–99)

## 2023-04-28 NOTE — Progress Notes (Signed)
[facility]  ROUNDING NOTE   Subjective:   [name] returns to [facility] on 04/23/2023 for Acute on chronic respiratory failure with hypoxia ([name]) [J96.21] Acute on chronic diastolic (congestive) heart failure ([name]) [I50.33]  Patient is known to our practice from previous admissions and receives outpatient dialysis at [facility] on a MWF schedule, supervised by [facility] physicians.  Patient seen sitting up in bed Alert and oriented  Denies cough  Appetite appropriate  Dialysis yesterday, UF 1.5L achieved   Objective:  Vital signs in last 24 hours:  Temp:  [98.4 F (36.9 C)-99.7 F (37.6 C)] 98.4 F (36.9 C) (05/25 0739) Pulse Rate:  [71-107] 77 (05/25 0739) Resp:  [16-24] 18 (05/25 0739) BP: (106-138)/(51-69) 119/59 (05/25 0739) SpO2:  [64 %-100 %] 92 % (05/25 0846) FiO2 (%):  [40 %-50 %] 40 % (05/25 0846) Weight:  [62.7 kg] 62.7 kg (05/24 1556)  Weight change: -5.279 kg Filed Weights   04/27/23 0500 04/27/23 0918 04/27/23 1556  Weight: 71.1 kg 65.8 kg 62.7 kg    Intake/Output: I/O last 3 completed shifts: In: 260.4 [I.V.:3.6; IV Piggyback:256.8] Out: 1500 [Other:1500]   Intake/Output this shift:  No intake/output data recorded.  Physical Exam: General: NAD,sitting up in bed  Head: Normocephalic, atraumatic. Moist oral mucosal membranes  Eyes: Anicteric  Neck: +tracheostomy  Lungs:  Diminished in bases   Heart: Regular rate and rhythm  Abdomen:  Soft, nontender  Extremities: No peripheral edema.  Neurologic: Nonfocal, moving all four extremities  Skin: No lesions  Access: Left AVG    Basic Metabolic Panel: Recent Labs  Lab 04/24/23 0443 04/25/23 0659 04/26/23 0903 04/27/23 0416 04/28/23 0419  NA 136 139 136 134* 137  K 4.4 4.5 4.3 4.4 4.2  CL 98 102 97* 97* 98  CO2 29 27 28 28 29   GLUCOSE 148* 134* 113* 167* 113*  BUN 31* 47* 31* 43* 22  CREATININE 5.95* 8.49* 6.29* 8.10* 5.51*  CALCIUM 8.4* 8.7* 8.8* 8.4* 8.6*  PHOS   --  3.8  --   --   --      Liver Function Tests: Recent Labs  Lab 04/24/23 0443 04/25/23 0659  AST 17  --   ALT 21  --   ALKPHOS 65  --   BILITOT 1.5*  --   PROT 6.2*  --   ALBUMIN 3.2* 2.9*    No results for input(s): "LIPASE", "AMYLASE" in the last 168 hours. No results for input(s): "AMMONIA" in the last 168 hours.  CBC: Recent Labs  Lab 04/23/23 0930 04/24/23 0443 04/25/23 0659 04/26/23 0903 04/27/23 0416 04/28/23 0419  WBC 12.2* 12.1* 10.3 9.6 11.2* 10.5  NEUTROABS 9.8* 10.4*  --   --   --   --   HGB 8.4* 8.0* 7.4* 7.9* 7.4* 7.3*  HCT 25.3* 23.6* 22.6* 23.2* 22.2* 22.2*  MCV 92.3 91.5 91.9 89.6 91.4 91.7  PLT 179 193 198 216 211 186     Cardiac Enzymes: No results for input(s): "CKTOTAL", "CKMB", "CKMBINDEX", "TROPONINI" in the last 168 hours.  BNP: Invalid input(s): "POCBNP"  CBG: Recent Labs  Lab 04/26/23 2112 04/27/23 0847 04/27/23 1641 04/27/23 2101 04/28/23 0741  GLUCAP 130* 184* 200* 156* 99     Microbiology: Results for orders placed or performed during the hospital encounter of 04/23/23  SARS Coronavirus 2 by RT PCR (hospital order, performed in [facility] hospital lab) *cepheid single result test* Anterior Nasal Swab     Status: None  Collection Time: 04/23/23  9:30 AM   Specimen: Anterior Nasal Swab  Result Value Ref Range Status   SARS Coronavirus 2 by RT PCR NEGATIVE NEGATIVE Final    Comment: (NOTE) SARS-CoV-2 target nucleic acids are NOT DETECTED.  The SARS-CoV-2 RNA is generally detectable in upper and lower respiratory specimens during the acute phase of infection. The lowest concentration of SARS-CoV-2 viral copies this assay can detect is 250 copies / mL. A negative result does not preclude SARS-CoV-2 infection and should not be used as the sole basis for treatment or other patient management decisions.  A negative result may occur with improper specimen collection / handling, submission of specimen other than  nasopharyngeal swab, presence of viral mutation(s) within the areas targeted by this assay, and inadequate number of viral copies (<250 copies / mL). A negative result must be combined with clinical observations, patient history, and epidemiological information.  Fact Sheet for Patients:   RoadLapTop.co.za  Fact Sheet for Healthcare Providers: http://kim-miller.com/  This test is not yet approved or  cleared by the [place] FDA and has been authorized for detection and/or diagnosis of SARS-CoV-2 by FDA under an Emergency Use Authorization (EUA).  This EUA will remain in effect (meaning this test can be used) for the duration of the COVID-19 declaration under Section 564(b)(1) of the Act, 21 U.S.C. section 360bbb-3(b)(1), unless the authorization is terminated or revoked sooner.  Performed at [facility], [address] [place], [place] [address]   Culture, blood (Routine X 2) w Reflex to ID Panel     Status: None (Preliminary result)   Collection Time: 04/24/23  7:52 PM   Specimen: BLOOD  Result Value Ref Range Status   Specimen Description BLOOD RIGHT ANTECUBITAL  Final   Special Requests   Final    BOTTLES DRAWN AEROBIC AND ANAEROBIC Blood Culture adequate volume   Culture   Final    NO GROWTH 4 DAYS Performed at [facility], [address], [place], [place] [address]    Report Status PENDING  Incomplete  Culture, blood (Routine X 2) w Reflex to ID Panel     Status: None (Preliminary result)   Collection Time: 04/24/23  7:56 PM   Specimen: BLOOD  Result Value Ref Range Status   Specimen Description BLOOD RIGHT ANTECUBITAL  Final   Special Requests   Final    BOTTLES DRAWN AEROBIC AND ANAEROBIC Blood Culture adequate volume   Culture   Final    NO GROWTH 4 DAYS Performed at [facility], [address] [place], [place] [address]    Report Status PENDING  Incomplete  Respiratory (~20  pathogens) panel by PCR     Status: None   Collection Time: 04/25/23  2:01 PM   Specimen: Nasopharyngeal Swab; Respiratory  Result Value Ref Range Status   Adenovirus NOT DETECTED NOT DETECTED Final   Coronavirus 229E NOT DETECTED NOT DETECTED Final    Comment: (NOTE) The Coronavirus on the Respiratory Panel, DOES NOT test for the novel  Coronavirus (2019 nCoV)    Coronavirus HKU1 NOT DETECTED NOT DETECTED Final   Coronavirus NL63 NOT DETECTED NOT DETECTED Final   Coronavirus OC43 NOT DETECTED NOT DETECTED Final   Metapneumovirus NOT DETECTED NOT DETECTED Final   Rhinovirus / Enterovirus NOT DETECTED NOT DETECTED Final   Influenza A NOT DETECTED NOT DETECTED Final   Influenza B NOT DETECTED NOT DETECTED Final   Parainfluenza Virus 1 NOT DETECTED NOT DETECTED Final   Parainfluenza  Virus 2 NOT DETECTED NOT DETECTED Final   Parainfluenza Virus 3 NOT DETECTED NOT DETECTED Final   Parainfluenza Virus 4 NOT DETECTED NOT DETECTED Final   Respiratory Syncytial Virus NOT DETECTED NOT DETECTED Final   Bordetella pertussis NOT DETECTED NOT DETECTED Final   Bordetella Parapertussis NOT DETECTED NOT DETECTED Final   Chlamydophila pneumoniae NOT DETECTED NOT DETECTED Final   Mycoplasma pneumoniae NOT DETECTED NOT DETECTED Final    Comment: Performed at [facility], [address], [place], [place] [address]    Coagulation Studies: No results for input(s): "LABPROT", "INR" in the last 72 hours.   Urinalysis: No results for input(s): "COLORURINE", "LABSPEC", "PHURINE", "GLUCOSEU", "HGBUR", "BILIRUBINUR", "KETONESUR", "PROTEINUR", "UROBILINOGEN", "NITRITE", "LEUKOCYTESUR" in the last 72 hours.  Invalid input(s): "APPERANCEUR"    Imaging: No results found.   Medications:    sodium chloride 5 mL/hr at 04/27/23 2257   ceFEPime (MAXIPIME) IV 1 g (04/27/23 2258)     amLODipine  10 mg Oral Daily   apixaban  2.5 mg Oral BID   aspirin EC  81 mg Oral Daily   atorvastatin  80 mg Oral  q1800   carvedilol  25 mg Oral BID   Chlorhexidine Gluconate Cloth  6 each Topical Q0600   diphenhydrAMINE  12.5 mg Oral Once   epoetin (EPOGEN/PROCRIT) injection  10,000 Units Intravenous Q M,W,F-HD   escitalopram  10 mg Oral Daily   gabapentin  300 mg Oral QHS   insulin aspart  0-5 Units Subcutaneous QHS   insulin aspart  0-6 Units Subcutaneous TID WC   insulin detemir  4 Units Subcutaneous BID   ipratropium-albuterol  3 mL Nebulization Q6H   irbesartan  150 mg Oral Daily   midodrine  5 mg Oral Once per day on Mon Wed Fri   multivitamin  1 tablet Oral QHS   pantoprazole  40 mg Oral Daily   sevelamer carbonate  1,600 mg Oral TID WC   traZODone  50 mg Oral Once per day on Mon Wed Fri   sodium chloride, acetaminophen **OR** acetaminophen, guaiFENesin-dextromethorphan, ondansetron **OR** ondansetron (ZOFRAN) IV  Assessment/ Plan:  [name] is a 67 y.o.  female with end stage renal disease on hemodialysis, tracheostomy, hypertension, hyperlipidemia, diabetes mellitus type II insulin dependent, depression, and diabetic neuropathy who presents to [facility] for 04/23/2023 for Acute on chronic respiratory failure with hypoxia ([name]) [J96.21] Acute on chronic diastolic (congestive) heart failure ([name]) [I50.33]  [facility] MWF [address] Left AVG 62kg  End Stage Kidney Disease:  - Dialysis received yesterday, UF 1.5L as tolerated. Next treatment scheduled for Monday  Hypertension with chronic kidney disease: current regimen of irbesartan, carvedilol and amlodipine. Home regimen of olmesartan instead of irbesartan. Also prescribed Midodrine to prevent hypotension during dialysis.   -Blood pressure stable   Diabetes mellitus type II with chronic kidney disease: insulin dependent.  Glucose well controlled Sliding scale managed by primary team.  Secondary Hyperparathyroidism  - Calcium within acceptable range. - Continue sevelamer with meals  Anemia of chronic kidney  disease: hemoglobin 7.3,  - Below desired range -Continue EPO with dialysis   LOS: 4   5/25/202410:08 AM

## 2023-04-28 NOTE — Progress Notes (Signed)
[facility]  PROGRESS NOTE  Deborah [name] BMW:[id number] DOB: 01-26-1956 DOA: 04/23/2023 PCP: Si Gaul, MD   Brief HPI:   67 year old African-American female history of end-stage renal disease on dialysis Monday Wednesday Friday, paroxysmal atrial fibrillation on Eliquis, history of tracheostomy, chronic diastolic heart failure, chronic hypoxia at night, uncontrolled type 2 diabetes, hypertension, presents the ER today with worsening cough and hypoxia.  Patient was recently in the hospital and discharged 5/19 after treatment for pneumonia. Chest x-ray showed significant volume overload, patient was dialyzed after admission 5/20.     Assessment/Plan:    Acute on chronic respiratory failure with hypoxia -Tracheostomy status -CT chest showed vascular congestion/volume overload with acute on chronic diastolic heart failure -Nephrology consulted for dialysis -Concern for aspiration, patient has tracheostomy -Swallow evaluation obtained, recommend regular consistency diet with thin liquids, aspiration precautions   Community-acquired pneumonia -Had been on cefepime during previous hospital stay and was discharged on Levaquin; family never filled Levaquin prescription - cefepime has been restarted -Sputum culture also grew Corynebacterium species -ID was consulted;  2 d echo obtained to evaluate the functioning of the valve.  This revealed normal valves.  EF of 55-60.  Ventricular diastolic dysfunction  -Pseudomonas in -tracheal aspirate, because of SOB, recurrent hospitalization  - will treat as pseudomonas resp infection with cefepime for 10 days-total.  Can be given during dialysis.  End date would be 04/29/2023   Diarrhea -Likely antibiotic induced -Improved -Obtain GI pathogen panel  ESRD -Patient on hemodialysis  Mitral valve replacement -2D echo ordered  Paroxysmal atrial fibrillation -Continue Eliquis, Coreg  Diabetes mellitus type 2 -Continue Levemir, sliding  scale insulin with NovoLog -CBG well-controlled  Hypertension -Blood pressure is stable    Medications     amLODipine  10 mg Oral Daily   apixaban  2.5 mg Oral BID   aspirin EC  81 mg Oral Daily   atorvastatin  80 mg Oral q1800   carvedilol  25 mg Oral BID   Chlorhexidine Gluconate Cloth  6 each Topical Q0600   diphenhydrAMINE  12.5 mg Oral Once   epoetin (EPOGEN/PROCRIT) injection  10,000 Units Intravenous Q M,W,F-HD   escitalopram  10 mg Oral Daily   gabapentin  300 mg Oral QHS   insulin aspart  0-5 Units Subcutaneous QHS   insulin aspart  0-6 Units Subcutaneous TID WC   insulin detemir  4 Units Subcutaneous BID   ipratropium-albuterol  3 mL Nebulization Q6H   irbesartan  150 mg Oral Daily   midodrine  5 mg Oral Once per day on Mon Wed Fri   multivitamin  1 tablet Oral QHS   pantoprazole  40 mg Oral Daily   sevelamer carbonate  1,600 mg Oral TID WC   traZODone  50 mg Oral Once per day on Mon Wed Fri     Data Reviewed:   CBG:  Recent Labs  Lab 04/26/23 2112 04/27/23 0847 04/27/23 1641 04/27/23 2101 04/28/23 0741  GLUCAP 130* 184* 200* 156* 99    SpO2: 97 % O2 Flow Rate (L/min): 12 L/min FiO2 (%): (S) 50 % (pt found with desats; trach cleaned and fi02 increased temp to 100%, pt suctioned)    Vitals:   04/28/23 0009 04/28/23 0221 04/28/23 0404 04/28/23 0739  BP: (!) 110/51  106/60 (!) 119/59  Pulse: 84  71 77  Resp: 17  16 18   Temp: 99.7 F (37.6 C)  98.5 F (36.9 C) 98.4 F (36.9 C)  TempSrc:  SpO2: 95% 92% 90% 97%  Weight:      Height:          Data Reviewed:  Basic Metabolic Panel: Recent Labs  Lab 04/24/23 0443 04/25/23 0659 04/26/23 0903 04/27/23 0416 04/28/23 0419  NA 136 139 136 134* 137  K 4.4 4.5 4.3 4.4 4.2  CL 98 102 97* 97* 98  CO2 29 27 28 28 29   GLUCOSE 148* 134* 113* 167* 113*  BUN 31* 47* 31* 43* 22  CREATININE 5.95* 8.49* 6.29* 8.10* 5.51*  CALCIUM 8.4* 8.7* 8.8* 8.4* 8.6*  PHOS  --  3.8  --   --   --      CBC: Recent Labs  Lab 04/23/23 0930 04/24/23 0443 04/25/23 0659 04/26/23 0903 04/27/23 0416 04/28/23 0419  WBC 12.2* 12.1* 10.3 9.6 11.2* 10.5  NEUTROABS 9.8* 10.4*  --   --   --   --   HGB 8.4* 8.0* 7.4* 7.9* 7.4* 7.3*  HCT 25.3* 23.6* 22.6* 23.2* 22.2* 22.2*  MCV 92.3 91.5 91.9 89.6 91.4 91.7  PLT 179 193 198 216 211 186    LFT Recent Labs  Lab 04/24/23 0443 04/25/23 0659  AST 17  --   ALT 21  --   ALKPHOS 65  --   BILITOT 1.5*  --   PROT 6.2*  --   ALBUMIN 3.2* 2.9*     Antibiotics: Anti-infectives (From admission, onward)    Start     Dose/Rate Route Frequency Ordered Stop   04/23/23 2200  ceFEPIme (MAXIPIME) 1 g in sodium chloride 0.9 % 100 mL IVPB        1 g 200 mL/hr over 30 Minutes Intravenous Every 24 hours 04/23/23 2123 04/29/23 2359   04/23/23 2015  ceFEPIme (MAXIPIME) 2 g in sodium chloride 0.9 % 100 mL IVPB  Status:  Discontinued        2 g 200 mL/hr over 30 Minutes Intravenous  Once 04/23/23 2012 04/23/23 2123   04/23/23 2015  azithromycin (ZITHROMAX) 500 mg in sodium chloride 0.9 % 250 mL IVPB        500 mg 250 mL/hr over 60 Minutes Intravenous  Once 04/23/23 2012 04/24/23 0327        DVT prophylaxis: Apixaban  Code Status: Full code  Family Communication: No family at bedside   CONSULTS nephrology   Subjective   Patient seen and examined, denies any complaints.  No shortness of breath.  Objective    Physical Examination:  Appears in no acute distress S1-S2, regular, no murmur auscultated Lungs clear to auscultation bilaterally, tracheostomy in place Abdomen is soft, nontender, no organomegaly No edema in the lower extremities  Status is: Inpatient:             [name]   [facility] If 7PM-7AM, please contact night-coverage at [email or web address], Office  [phone]   04/28/2023, 8:33 AM  LOS: 4 days

## 2023-04-29 ENCOUNTER — Inpatient Hospital Stay: Payer: 59

## 2023-04-29 DIAGNOSIS — Z7901 Long term (current) use of anticoagulants: Secondary | ICD-10-CM | POA: Diagnosis not present

## 2023-04-29 DIAGNOSIS — J189 Pneumonia, unspecified organism: Secondary | ICD-10-CM | POA: Diagnosis not present

## 2023-04-29 DIAGNOSIS — J9621 Acute and chronic respiratory failure with hypoxia: Secondary | ICD-10-CM | POA: Diagnosis not present

## 2023-04-29 DIAGNOSIS — I5033 Acute on chronic diastolic (congestive) heart failure: Secondary | ICD-10-CM | POA: Diagnosis not present

## 2023-04-29 LAB — GASTROINTESTINAL PANEL BY PCR, STOOL (REPLACES STOOL CULTURE)
Adenovirus F40/41: NOT DETECTED
Astrovirus: NOT DETECTED
Campylobacter species: NOT DETECTED
Cryptosporidium: NOT DETECTED
Cyclospora cayetanensis: NOT DETECTED
Entamoeba histolytica: NOT DETECTED
Enteroaggregative E coli (EAEC): NOT DETECTED
Enteropathogenic E coli (EPEC): NOT DETECTED
Enterotoxigenic E coli (ETEC): NOT DETECTED
Giardia lamblia: NOT DETECTED
Norovirus GI/GII: NOT DETECTED
Plesimonas shigelloides: NOT DETECTED
Rotavirus A: NOT DETECTED
Salmonella species: NOT DETECTED
Sapovirus (I, II, IV, and V): NOT DETECTED
Shiga like toxin producing E coli (STEC): NOT DETECTED
Shigella/Enteroinvasive E coli (EIEC): NOT DETECTED
Vibrio cholerae: DETECTED — AB
Vibrio species: DETECTED — AB
Yersinia enterocolitica: NOT DETECTED

## 2023-04-29 LAB — GLUCOSE, CAPILLARY
Glucose-Capillary: 174 mg/dL — ABNORMAL HIGH (ref 70–99)
Glucose-Capillary: 224 mg/dL — ABNORMAL HIGH (ref 70–99)
Glucose-Capillary: 264 mg/dL — ABNORMAL HIGH (ref 70–99)
Glucose-Capillary: 130 mg/dL — ABNORMAL HIGH (ref 70–99)

## 2023-04-29 LAB — D-DIMER, QUANTITATIVE: D-Dimer, Quant: 1.05 ug/mL-FEU — ABNORMAL HIGH (ref 0.00–0.50)

## 2023-04-29 LAB — C-REACTIVE PROTEIN: CRP: 0.8 mg/dL (ref <1.0)

## 2023-04-29 NOTE — Progress Notes (Signed)
[facility]  ROUNDING NOTE   Subjective:   [name] returns to [facility] on 04/23/2023 for Acute on chronic respiratory failure with hypoxia ([name]) [J96.21] Acute on chronic diastolic (congestive) heart failure ([name]) [I50.33]  Patient is known to our practice from previous admissions and receives outpatient dialysis at [facility] on a MWF schedule, supervised by [facility] physicians.  Patient seen resting quietly No family at bedside States breathing is slowly improving  Remains on 10 L trach collar   Objective:  Vital signs in last 24 hours:  Temp:  [98.2 F (36.8 C)-99.3 F (37.4 C)] 98.3 F (36.8 C) (05/26 0700) Pulse Rate:  [76-86] 81 (05/26 0900) Resp:  [18-20] 18 (05/26 0700) BP: (102-146)/(50-75) 146/75 (05/26 0900) SpO2:  [85 %-100 %] 100 % (05/26 0900) FiO2 (%):  [50 %] 50 % (05/26 0744)  Weight change:  Filed Weights   04/27/23 0500 04/27/23 0918 04/27/23 1556  Weight: 71.1 kg 65.8 kg 62.7 kg    Intake/Output: I/O last 3 completed shifts: In: 960.4 [P.O.:600; I.V.:3.6; IV Piggyback:356.8] Out: -    Intake/Output this shift:  No intake/output data recorded.  Physical Exam: General: NAD  Head: Normocephalic, atraumatic. Moist oral mucosal membranes  Eyes: Anicteric  Neck: +tracheostomy  Lungs:  Diminished in bases   Heart: Regular rate and rhythm  Abdomen:  Soft, nontender  Extremities: No peripheral edema.  Neurologic: Nonfocal, moving all four extremities  Skin: No lesions  Access: Left AVG    Basic Metabolic Panel: Recent Labs  Lab 04/24/23 0443 04/25/23 0659 04/26/23 0903 04/27/23 0416 04/28/23 0419  NA 136 139 136 134* 137  K 4.4 4.5 4.3 4.4 4.2  CL 98 102 97* 97* 98  CO2 29 27 28 28 29   GLUCOSE 148* 134* 113* 167* 113*  BUN 31* 47* 31* 43* 22  CREATININE 5.95* 8.49* 6.29* 8.10* 5.51*  CALCIUM 8.4* 8.7* 8.8* 8.4* 8.6*  PHOS  --  3.8  --   --   --      Liver Function Tests: Recent Labs  Lab 04/24/23 0443  04/25/23 0659  AST 17  --   ALT 21  --   ALKPHOS 65  --   BILITOT 1.5*  --   PROT 6.2*  --   ALBUMIN 3.2* 2.9*    No results for input(s): "LIPASE", "AMYLASE" in the last 168 hours. No results for input(s): "AMMONIA" in the last 168 hours.  CBC: Recent Labs  Lab 04/23/23 0930 04/24/23 0443 04/25/23 0659 04/26/23 0903 04/27/23 0416 04/28/23 0419  WBC 12.2* 12.1* 10.3 9.6 11.2* 10.5  NEUTROABS 9.8* 10.4*  --   --   --   --   HGB 8.4* 8.0* 7.4* 7.9* 7.4* 7.3*  HCT 25.3* 23.6* 22.6* 23.2* 22.2* 22.2*  MCV 92.3 91.5 91.9 89.6 91.4 91.7  PLT 179 193 198 216 211 186     Cardiac Enzymes: No results for input(s): "CKTOTAL", "CKMB", "CKMBINDEX", "TROPONINI" in the last 168 hours.  BNP: Invalid input(s): "POCBNP"  CBG: Recent Labs  Lab 04/28/23 0741 04/28/23 1309 04/28/23 1634 04/28/23 2122 04/29/23 0855  GLUCAP 99 205* 156* 301* 174*     Microbiology: Results for orders placed or performed during the hospital encounter of 04/23/23  SARS Coronavirus 2 by RT PCR (hospital order, performed in [facility] hospital lab) *cepheid single result test* Anterior Nasal Swab     Status: None   Collection Time: 04/23/23  9:30 AM   Specimen: Anterior Nasal Swab  Result Value Ref Range Status   SARS Coronavirus 2 by RT PCR NEGATIVE NEGATIVE Final    Comment: (NOTE) SARS-CoV-2 target nucleic acids are NOT DETECTED.  The SARS-CoV-2 RNA is generally detectable in upper and lower respiratory specimens during the acute phase of infection. The lowest concentration of SARS-CoV-2 viral copies this assay can detect is 250 copies / mL. A negative result does not preclude SARS-CoV-2 infection and should not be used as the sole basis for treatment or other patient management decisions.  A negative result may occur with improper specimen collection / handling, submission of specimen other than nasopharyngeal swab, presence of viral mutation(s) within the areas targeted by this assay, and  inadequate number of viral copies (<250 copies / mL). A negative result must be combined with clinical observations, patient history, and epidemiological information.  Fact Sheet for Patients:   RoadLapTop.co.za  Fact Sheet for Healthcare Providers: http://kim-miller.com/  This test is not yet approved or  cleared by the [place] FDA and has been authorized for detection and/or diagnosis of SARS-CoV-2 by FDA under an Emergency Use Authorization (EUA).  This EUA will remain in effect (meaning this test can be used) for the duration of the COVID-19 declaration under Section 564(b)(1) of the Act, 21 U.S.C. section 360bbb-3(b)(1), unless the authorization is terminated or revoked sooner.  Performed at [facility], [address] [place], [place] [address]   Culture, blood (Routine X 2) w Reflex to ID Panel     Status: None   Collection Time: 04/24/23  7:52 PM   Specimen: BLOOD  Result Value Ref Range Status   Specimen Description BLOOD RIGHT ANTECUBITAL  Final   Special Requests   Final    BOTTLES DRAWN AEROBIC AND ANAEROBIC Blood Culture adequate volume   Culture   Final    NO GROWTH 5 DAYS Performed at [facility], [address] [place], [place] [address]    Report Status 04/29/2023 FINAL  Final  Culture, blood (Routine X 2) w Reflex to ID Panel     Status: None   Collection Time: 04/24/23  7:56 PM   Specimen: BLOOD  Result Value Ref Range Status   Specimen Description BLOOD RIGHT ANTECUBITAL  Final   Special Requests   Final    BOTTLES DRAWN AEROBIC AND ANAEROBIC Blood Culture adequate volume   Culture   Final    NO GROWTH 5 DAYS Performed at [facility], [address] [place], [place] [address]    Report Status 04/29/2023 FINAL  Final  Respiratory (~20 pathogens) panel by PCR     Status: None   Collection Time: 04/25/23  2:01 PM   Specimen: Nasopharyngeal Swab; Respiratory  Result  Value Ref Range Status   Adenovirus NOT DETECTED NOT DETECTED Final   Coronavirus 229E NOT DETECTED NOT DETECTED Final    Comment: (NOTE) The Coronavirus on the Respiratory Panel, DOES NOT test for the novel  Coronavirus (2019 nCoV)    Coronavirus HKU1 NOT DETECTED NOT DETECTED Final   Coronavirus NL63 NOT DETECTED NOT DETECTED Final   Coronavirus OC43 NOT DETECTED NOT DETECTED Final   Metapneumovirus NOT DETECTED NOT DETECTED Final   Rhinovirus / Enterovirus NOT DETECTED NOT DETECTED Final   Influenza A NOT DETECTED NOT DETECTED Final   Influenza B NOT DETECTED NOT DETECTED Final   Parainfluenza Virus 1 NOT DETECTED NOT DETECTED Final   Parainfluenza Virus 2 NOT DETECTED NOT DETECTED Final   Parainfluenza Virus 3 NOT DETECTED NOT  DETECTED Final   Parainfluenza Virus 4 NOT DETECTED NOT DETECTED Final   Respiratory Syncytial Virus NOT DETECTED NOT DETECTED Final   Bordetella pertussis NOT DETECTED NOT DETECTED Final   Bordetella Parapertussis NOT DETECTED NOT DETECTED Final   Chlamydophila pneumoniae NOT DETECTED NOT DETECTED Final   Mycoplasma pneumoniae NOT DETECTED NOT DETECTED Final    Comment: Performed at [facility], [address], [place], [place] [address]  Gastrointestinal Panel by PCR , Stool     Status: Abnormal   Collection Time: 04/26/23  6:56 AM   Specimen: Stool  Result Value Ref Range Status   Campylobacter species NOT DETECTED NOT DETECTED Final   Plesimonas shigelloides NOT DETECTED NOT DETECTED Final   Salmonella species NOT DETECTED NOT DETECTED Final   Yersinia enterocolitica NOT DETECTED NOT DETECTED Final   Vibrio species DETECTED (A) NOT DETECTED Final    Comment: RESULT CALLED TO, READ BACK BY AND VERIFIED WITH: JOSEPH HAYES AT 6045 04/29/23.PMF    Vibrio cholerae DETECTED (A) NOT DETECTED Final    Comment: RESULT CALLED TO, READ BACK BY AND VERIFIED WITH: JOSEPH HAYES AT 4098 04/29/23.PMF    Enteroaggregative E coli (EAEC) NOT DETECTED NOT  DETECTED Final   Enteropathogenic E coli (EPEC) NOT DETECTED NOT DETECTED Final   Enterotoxigenic E coli (ETEC) NOT DETECTED NOT DETECTED Final   Shiga like toxin producing E coli (STEC) NOT DETECTED NOT DETECTED Final   Shigella/Enteroinvasive E coli (EIEC) NOT DETECTED NOT DETECTED Final   Cryptosporidium NOT DETECTED NOT DETECTED Final   Cyclospora cayetanensis NOT DETECTED NOT DETECTED Final   Entamoeba histolytica NOT DETECTED NOT DETECTED Final   Giardia lamblia NOT DETECTED NOT DETECTED Final   Adenovirus F40/41 NOT DETECTED NOT DETECTED Final   Astrovirus NOT DETECTED NOT DETECTED Final   Norovirus GI/GII NOT DETECTED NOT DETECTED Final   Rotavirus A NOT DETECTED NOT DETECTED Final   Sapovirus (I, II, IV, and V) NOT DETECTED NOT DETECTED Final    Comment: Performed at [facility], [address] [place], [place] [address]    Coagulation Studies: No results for input(s): "LABPROT", "INR" in the last 72 hours.   Urinalysis: No results for input(s): "COLORURINE", "LABSPEC", "PHURINE", "GLUCOSEU", "HGBUR", "BILIRUBINUR", "KETONESUR", "PROTEINUR", "UROBILINOGEN", "NITRITE", "LEUKOCYTESUR" in the last 72 hours.  Invalid input(s): "APPERANCEUR"    Imaging: No results found.   Medications:    sodium chloride 5 mL/hr at 04/27/23 2257   ceFEPime (MAXIPIME) IV 200 mL/hr at 04/29/23 7829     amLODipine  10 mg Oral Daily   apixaban  2.5 mg Oral BID   aspirin EC  81 mg Oral Daily   atorvastatin  80 mg Oral q1800   carvedilol  25 mg Oral BID   Chlorhexidine Gluconate Cloth  6 each Topical Q0600   diphenhydrAMINE  12.5 mg Oral Once   epoetin (EPOGEN/PROCRIT) injection  10,000 Units Intravenous Q M,W,F-HD   escitalopram  10 mg Oral Daily   gabapentin  300 mg Oral QHS   insulin aspart  0-5 Units Subcutaneous QHS   insulin aspart  0-6 Units Subcutaneous TID WC   insulin detemir  4 Units Subcutaneous BID   ipratropium-albuterol  3 mL Nebulization Q6H   irbesartan   150 mg Oral Daily   midodrine  5 mg Oral Once per day on Mon Wed Fri   multivitamin  1 tablet Oral QHS   pantoprazole  40 mg Oral Daily   sevelamer carbonate  1,600 mg Oral  TID WC   traZODone  50 mg Oral Once per day on Mon Wed Fri   sodium chloride, acetaminophen **OR** acetaminophen, guaiFENesin-dextromethorphan, ondansetron **OR** ondansetron (ZOFRAN) IV  Assessment/ Plan:  [name] is a 67 y.o.  female with end stage renal disease on hemodialysis, tracheostomy, hypertension, hyperlipidemia, diabetes mellitus type II insulin dependent, depression, and diabetic neuropathy who presents to [facility] for 04/23/2023 for Acute on chronic respiratory failure with hypoxia ([name]) [J96.21] Acute on chronic diastolic (congestive) heart failure ([name]) [I50.33]  [facility] Nephrology MWF [address] Left AVG 62kg  End Stage Kidney Disease:  -  Next treatment scheduled for Monday  Hypertension with chronic kidney disease: current regimen of irbesartan, carvedilol and amlodipine. Home regimen of olmesartan instead of irbesartan. Also prescribed Midodrine to prevent hypotension during dialysis.   -Blood pressure remains acceptable for this patient   Diabetes mellitus type II with chronic kidney disease: insulin dependent.  Glucose remains elevated at times Primary team to continue to manage sliding scale insulin  Secondary Hyperparathyroidism  -Bone minerals acceptable. - Continue sevelamer with meals  Anemia of chronic kidney disease: -Continue EPO with dialysis   LOS: 5   5/26/202410:54 AM

## 2023-04-29 NOTE — Progress Notes (Signed)
Suctioned trach for mod amt of thick white secretions

## 2023-04-29 NOTE — Progress Notes (Addendum)
[facility]  PROGRESS NOTE  Deborah [name] ZOX:[id number] DOB: [birth year] DOA: 04/23/2023 PCP: Si Gaul, MD   Brief HPI:   67 year old African-American female history of end-stage renal disease on dialysis Monday Wednesday Friday, paroxysmal atrial fibrillation on Eliquis, history of tracheostomy, chronic diastolic heart failure, chronic hypoxia at night, uncontrolled type 2 diabetes, hypertension, presents the ER today with worsening cough and hypoxia.  Patient was recently in the hospital and discharged 5/19 after treatment for pneumonia. Chest x-ray showed significant volume overload, patient was dialyzed after admission 5/20.     Assessment/Plan:    Acute on chronic respiratory failure with hypoxia -Tracheostomy status -CT chest showed vascular congestion/volume overload with acute on chronic diastolic heart failure -Nephrology consulted for dialysis -Concern for aspiration, patient has tracheostomy -Swallow evaluation obtained, recommend regular consistency diet with thin liquids, aspiration precautions   Community-acquired pneumonia -Had been on cefepime during previous hospital stay and was discharged on Levaquin; family never filled Levaquin prescription - cefepime has been restarted -Sputum culture also grew Corynebacterium species -ID was consulted;  2 d echo obtained to evaluate the functioning of the valve.  This revealed normal valves.  EF of 55-60.  Ventricular diastolic dysfunction  -Pseudomonas in -tracheal aspirate, because of SOB, recurrent hospitalization  - will treat as pseudomonas resp infection with cefepime for 10 days-total.  Can be given during dialysis.  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 Algis Liming; no need to treat as patient is not having diarrhea anymore  ESRD -Patient on hemodialysis  Mitral valve  replacement -2D echo ordered  Paroxysmal atrial fibrillation -Continue Eliquis, Coreg  Diabetes mellitus type 2 -Continue Levemir, sliding scale insulin with NovoLog -CBG well-controlled  Hypertension -Blood pressure is stable    Medications     amLODipine  10 mg Oral Daily   apixaban  2.5 mg Oral BID   aspirin EC  81 mg Oral Daily   atorvastatin  80 mg Oral q1800   carvedilol  25 mg Oral BID   Chlorhexidine Gluconate Cloth  6 each Topical Q0600   diphenhydrAMINE  12.5 mg Oral Once   epoetin (EPOGEN/PROCRIT) injection  10,000 Units Intravenous Q M,W,F-HD   escitalopram  10 mg Oral Daily   gabapentin  300 mg Oral QHS   insulin aspart  0-5 Units Subcutaneous QHS   insulin aspart  0-6 Units Subcutaneous TID WC   insulin detemir  4 Units Subcutaneous BID   ipratropium-albuterol  3 mL Nebulization Q6H   irbesartan  150 mg Oral Daily   midodrine  5 mg Oral Once per day on Mon Wed Fri   multivitamin  1 tablet Oral QHS   pantoprazole  40 mg Oral Daily   sevelamer carbonate  1,600 mg Oral TID WC   traZODone  50 mg Oral Once per day on Mon Wed Fri     Data Reviewed:   CBG:  Recent Labs  Lab 04/27/23 2101 04/28/23 0741 04/28/23 1309 04/28/23 1634 04/28/23 2122  GLUCAP 156* 99 205* 156* 301*    SpO2: 95 % O2 Flow Rate (L/min): 10 L/min FiO2 (%): 50 %    Vitals:   04/29/23 0240 04/29/23 0451 04/29/23 0451 04/29/23 0700  BP:   (!) 107/51 (!) 140/55  Pulse:   78 81  Resp:   19 18  Temp:   98.4 F (36.9 C)   TempSrc:  SpO2: 92% 90% (!) 85% 95%  Weight:      Height:          Data Reviewed:  Basic Metabolic Panel: Recent Labs  Lab 04/24/23 0443 04/25/23 0659 04/26/23 0903 04/27/23 0416 04/28/23 0419  NA 136 139 136 134* 137  K 4.4 4.5 4.3 4.4 4.2  CL 98 102 97* 97* 98  CO2 29 27 28 28 29   GLUCOSE 148* 134* 113* 167* 113*  BUN 31* 47* 31* 43* 22  CREATININE 5.95* 8.49* 6.29* 8.10* 5.51*  CALCIUM 8.4* 8.7* 8.8* 8.4* 8.6*  PHOS  --  3.8  --    --   --     CBC: Recent Labs  Lab 04/23/23 0930 04/24/23 0443 04/25/23 0659 04/26/23 0903 04/27/23 0416 04/28/23 0419  WBC 12.2* 12.1* 10.3 9.6 11.2* 10.5  NEUTROABS 9.8* 10.4*  --   --   --   --   HGB 8.4* 8.0* 7.4* 7.9* 7.4* 7.3*  HCT 25.3* 23.6* 22.6* 23.2* 22.2* 22.2*  MCV 92.3 91.5 91.9 89.6 91.4 91.7  PLT 179 193 198 216 211 186    LFT Recent Labs  Lab 04/24/23 0443 04/25/23 0659  AST 17  --   ALT 21  --   ALKPHOS 65  --   BILITOT 1.5*  --   PROT 6.2*  --   ALBUMIN 3.2* 2.9*     Antibiotics: Anti-infectives (From admission, onward)    Start     Dose/Rate Route Frequency Ordered Stop   04/23/23 2200  ceFEPIme (MAXIPIME) 1 g in sodium chloride 0.9 % 100 mL IVPB        1 g 200 mL/hr over 30 Minutes Intravenous Every 24 hours 04/23/23 2123 04/29/23 2359   04/23/23 2015  ceFEPIme (MAXIPIME) 2 g in sodium chloride 0.9 % 100 mL IVPB  Status:  Discontinued        2 g 200 mL/hr over 30 Minutes Intravenous  Once 04/23/23 2012 04/23/23 2123   04/23/23 2015  azithromycin (ZITHROMAX) 500 mg in sodium chloride 0.9 % 250 mL IVPB        500 mg 250 mL/hr over 60 Minutes Intravenous  Once 04/23/23 2012 04/24/23 0327        DVT prophylaxis: Apixaban  Code Status: Full code  Family Communication: No family at bedside   CONSULTS nephrology   Subjective   Denies shortness of breath.  No diarrhea.  Objective    Physical Examination:  General-appears in no acute distress Neck-tracheostomy in place Heart-S1-S2, regular, no murmur auscultated Lungs-clear to auscultation bilaterally, no wheezing or crackles auscultated Abdomen-soft, nontender, no organomegaly Extremities-no edema in the lower extremities Neuro-alert, oriented x3, no focal deficit noted  Status is: Inpatient:             [name]   [facility] If 7PM-7AM, please contact night-coverage at [email or web address], Office  [phone]   04/29/2023, 8:50 AM  LOS: 5 days

## 2023-04-30 DIAGNOSIS — J189 Pneumonia, unspecified organism: Secondary | ICD-10-CM | POA: Diagnosis not present

## 2023-04-30 DIAGNOSIS — N186 End stage renal disease: Secondary | ICD-10-CM | POA: Diagnosis not present

## 2023-04-30 DIAGNOSIS — I5033 Acute on chronic diastolic (congestive) heart failure: Secondary | ICD-10-CM | POA: Diagnosis not present

## 2023-04-30 DIAGNOSIS — J9621 Acute and chronic respiratory failure with hypoxia: Secondary | ICD-10-CM | POA: Diagnosis not present

## 2023-04-30 LAB — CBC
HCT: 23 % — ABNORMAL LOW (ref 36.0–46.0)
Hemoglobin: 7.6 g/dL — ABNORMAL LOW (ref 12.0–15.0)
MCH: 30.4 pg (ref 26.0–34.0)
MCHC: 33 g/dL (ref 30.0–36.0)
MCV: 92 fL (ref 80.0–100.0)
Platelets: 245 10*3/uL (ref 150–400)
RBC: 2.5 MIL/uL — ABNORMAL LOW (ref 3.87–5.11)
RDW: 15.7 % — ABNORMAL HIGH (ref 11.5–15.5)
WBC: 11.3 10*3/uL — ABNORMAL HIGH (ref 4.0–10.5)
nRBC: 0 % (ref 0.0–0.2)

## 2023-04-30 LAB — RENAL FUNCTION PANEL
Albumin: 2.9 g/dL — ABNORMAL LOW (ref 3.5–5.0)
Anion gap: 13 (ref 5–15)
BUN: 51 mg/dL — ABNORMAL HIGH (ref 8–23)
CO2: 24 mmol/L (ref 22–32)
Calcium: 8.7 mg/dL — ABNORMAL LOW (ref 8.9–10.3)
Chloride: 98 mmol/L (ref 98–111)
Creatinine, Ser: 10.47 mg/dL — ABNORMAL HIGH (ref 0.44–1.00)
GFR, Estimated: 4 mL/min — ABNORMAL LOW (ref >60)
Glucose, Bld: 150 mg/dL — ABNORMAL HIGH (ref 70–99)
Phosphorus: 4.3 mg/dL (ref 2.5–4.6)
Potassium: 3.8 mmol/L (ref 3.5–5.1)
Sodium: 135 mmol/L (ref 135–145)

## 2023-04-30 LAB — GLUCOSE, CAPILLARY
Glucose-Capillary: 119 mg/dL — ABNORMAL HIGH (ref 70–99)
Glucose-Capillary: 117 mg/dL — ABNORMAL HIGH (ref 70–99)
Glucose-Capillary: 109 mg/dL — ABNORMAL HIGH (ref 70–99)

## 2023-04-30 MED ORDER — EPOETIN ALFA 10000 UNIT/ML IJ SOLN
INTRAMUSCULAR | Status: AC
  Filled 2023-04-30: qty 1

## 2023-04-30 NOTE — Consult Note (Signed)
PULMONOLOGY         Date: 04/30/2023,   MRN# [id number] CERYS [name] 10-Apr-1956     AdmissionWeight:  (Unable to weigh , Pt on stretcher, too short of breath to stand)                 CurrentWeight: 64.5 kg  Referring provider: Dr Sharl Ma   CHIEF COMPLAINT:   Acute on chronic hypoxemic respiratory failure   HISTORY OF PRESENT ILLNESS   This is a pleasant 67 year old female with a history of cardiac dysfunction including diastolic heart failure, chronic atrial fibrillation with atrial flutter, cardiogenic shock, metabolic syndrome with diabetes, dyslipidemia and ESRD with chronic hypertension and is currently receiving dialysis 3 times weekly Monday Wednesday Friday, does have chronic respiratory insufficiency with episodes of hypoxemia tracheostomy status currently requiring 10 L supplemental O2, this particular hospitalization complicated by increased O2 requirement with difficulty weaning.  PCCM consultation for further evaluation management.   PAST MEDICAL HISTORY   Past Medical History:  Diagnosis Date   Acute on chronic diastolic CHF (congestive heart failure) ([name]) 01/04/2022   Atrial fibrillation ([name])    Atrial flutter ([name])    Cardiogenic shock ([name]) 08/17/2016   Diabetes mellitus    Diabetes mellitus type 2, controlled ([name]) 03/08/2012   ESRD (end stage renal disease) ([name])    Heart attack ([name])    Hyperlipidemia    Hypertension    MI, old    NSTEMI (non-ST elevated myocardial infarction) ([name])    Renal disorder    dialysls   Respiratory failure ([name]) 08/17/2016   ST elevation myocardial infarction involving left main coronary artery ([name])    STEMI (ST elevation myocardial infarction) ([name]) 08/15/2016   Type 2 diabetes mellitus with complication, with long-term current use of insulin [facility])      SURGICAL HISTORY   Past Surgical History:  Procedure Laterality Date   A/V FISTULAGRAM N/A 10/05/2017   Procedure: A/V Fistulagram;  Surgeon: Renford Dills, MD;  Location: [facility] INVASIVE CV LAB;  Service: Cardiovascular;  Laterality: N/A;   A/V SHUNTOGRAM N/A 10/05/2017   Procedure: A/V SHUNTOGRAM;  Surgeon: Renford Dills, MD;  Location: [facility] INVASIVE CV LAB;  Service: Cardiovascular;  Laterality: N/A;   AV FISTULA PLACEMENT Left 10/02/2016   Procedure: INSERTION OF ARTERIOVENOUS (AV) GORE-TEX GRAFT ARM;  Surgeon: Maeola Harman, MD;  Location: [facility] OR;  Service: Vascular;  Laterality: Left;   CARDIAC CATHETERIZATION N/A 08/15/2016   Procedure: Left Heart Cath and Coronary Angiography;  Surgeon: Alwyn Pea, MD;  Location: [facility] INVASIVE CV LAB;  Service: Cardiovascular;  Laterality: N/A;   CARDIAC CATHETERIZATION N/A 08/15/2016   Procedure: Coronary Stent Intervention;  Surgeon: Alwyn Pea, MD;  Location: [facility] INVASIVE CV LAB;  Service: Cardiovascular;  Laterality: N/A;   CARDIAC CATHETERIZATION N/A 08/18/2016   Procedure: Right Heart Cath;  Surgeon: Dolores Patty, MD;  Location: [facility] INVASIVE CV LAB;  Service: Cardiovascular;  Laterality: N/A;   CARDIAC CATHETERIZATION N/A 08/18/2016   Procedure: IABP Insertion;  Surgeon: Dolores Patty, MD;  Location: [facility] INVASIVE CV LAB;  Service: Cardiovascular;  Laterality: N/A;   CARDIAC CATHETERIZATION Right 08/23/2016   Procedure: CENTRAL LINE INSERTION RIGHT SUBCLAVIAN;  Surgeon: Kerin Perna, MD;  Location: [facility] OR;  Service: Open Heart Surgery;  Laterality: Right;   ENDOVEIN HARVEST OF GREATER SAPHENOUS VEIN Left 08/23/2016   Procedure: ENDOVEIN HARVEST OF GREATER SAPHENOUS VEIN;  Surgeon: Kerin Perna, MD;  Location: [facility]  OR;  Service: Open Heart Surgery;  Laterality: Left;   INSERTION OF DIALYSIS CATHETER Bilateral 08/31/2016   Procedure: INSERTION OF DIALYSIS CATHETER LEFT INTERNAL JUGULAR VEIN & INSERTION OF TRIPLE LUMEN RIGHT INTERNAL JUGULAR VEIN;  Surgeon: Chuck Hint, MD;  Location: [facility] OR;  Service: Vascular;  Laterality: Bilateral;   INTRAOPERATIVE  TRANSESOPHAGEAL ECHOCARDIOGRAM N/A 08/23/2016   Procedure: INTRAOPERATIVE TRANSESOPHAGEAL ECHOCARDIOGRAM;  Surgeon: Kerin Perna, MD;  Location: [facility] OR;  Service: Open Heart Surgery;  Laterality: N/A;   IR GENERIC HISTORICAL  11/13/2016   IR US GUIDE VASC ACCESS LEFT 11/13/2016 Gilmer Mor, DO [facility]INTER[facility]   IR GENERIC HISTORICAL  11/13/2016   IR FLUORO GUIDE CV LINE LEFT 11/13/2016 Gilmer Mor, DO [facility]   IR GENERIC HISTORICAL  11/13/2016   IR GASTROSTOMY TUBE MOD SED 11/13/2016 Gilmer Mor, DO [facility]   MITRAL VALVE REPAIR N/A 08/23/2016   Procedure: MITRAL VALVE REPAIR (MVR) USING EDWARDS MAGNA EASE BIOPROSTHESIS MITRAL  VALVE;  Surgeon: Kerin Perna, MD;  Location: [facility] OR;  Service: Open Heart Surgery;  Laterality: N/A;   tracheostomy reversal     TRACHEOSTOMY TUBE PLACEMENT N/A 11/09/2016   Procedure: TRACHEOSTOMY;  Surgeon: Suzanna Obey, MD;  Location: [facility] OR;  Service: ENT;  Laterality: N/A;   VAGINAL DELIVERY     x 6     FAMILY HISTORY   Family History  Problem Relation Age of Onset   Hypertension Mother    Diabetes Mother    Breast cancer Sister    Hypertension Father      SOCIAL HISTORY   Social History   Tobacco Use   Smoking status: Never   Smokeless tobacco: Never  Substance Use Topics   Alcohol use: No   Drug use: No     MEDICATIONS    Home Medication:    Current Medication:  Current Facility-Administered Medications:    0.9 %  sodium chloride infusion, , Intravenous, PRN, [name], MD, Last Rate: 5 mL/hr at 04/27/23 2257, New Bag at 04/27/23 2257   acetaminophen (TYLENOL) tablet 650 mg, 650 mg, Oral, Q6H PRN, 650 mg at 04/25/23 1337 **OR** acetaminophen (TYLENOL) suppository 650 mg, 650 mg, Rectal, Q6H PRN, Sharl Ma, Sarina Ill, MD   amLODipine (NORVASC) tablet 10 mg, 10 mg, Oral, Daily, Sharl Ma, Gagan S, MD, 10 mg at 04/29/23 0914   apixaban (ELIQUIS) tablet 2.5 mg, 2.5 mg, Oral, BID, Sharl Ma, Gagan S, MD, 2.5 mg at 04/29/23 2304    aspirin EC tablet 81 mg, 81 mg, Oral, Daily, Sharl Ma, Gagan S, MD, 81 mg at 04/29/23 0914   atorvastatin (LIPITOR) tablet 80 mg, 80 mg, Oral, q1800, Sharl Ma, Gagan S, MD, 80 mg at 04/29/23 1721   carvedilol (COREG) tablet 25 mg, 25 mg, Oral, BID, Sharl Ma, Gagan S, MD, 25 mg at 04/29/23 2304   Chlorhexidine Gluconate Cloth 2 % PADS 6 each, 6 each, Topical, Q0600, [name], MD, 6 each at 04/30/23 4098   diphenhydrAMINE (BENADRYL) 12.5 MG/5ML elixir 12.5 mg, 12.5 mg, Oral, Once, [place], Sarina Ill, MD   epoetin alfa (EPOGEN) injection 10,000 Units, 10,000 Units, Intravenous, Q M,W,F-HD, Sharl Ma, Sarina Ill, MD, 10,000 Units at 04/27/23 1025   escitalopram (LEXAPRO) tablet 10 mg, 10 mg, Oral, Daily, Sharl Ma, Gagan S, MD, 10 mg at 04/29/23 0914   gabapentin (NEURONTIN) capsule 300 mg, 300 mg, Oral, QHS, Sharl Ma, Gagan S, MD, 300 mg at 04/29/23 2304   guaiFENesin-dextromethorphan (ROBITUSSIN DM) 100-10 MG/5ML syrup 15 mL, 15 mL, Oral,  Q4H PRN, [name], MD, 15 mL at 04/28/23 0024   insulin aspart (novoLOG) injection 0-5 Units, 0-5 Units, Subcutaneous, QHS, Sharl Ma, Gagan S, MD, 4 Units at 04/28/23 2221   insulin aspart (novoLOG) injection 0-6 Units, 0-6 Units, Subcutaneous, TID WC, [name], MD, 3 Units at 04/29/23 1721   insulin detemir (LEVEMIR) injection 4 Units, 4 Units, Subcutaneous, BID, Sharl Ma, Sarina Ill, MD, 4 Units at 04/29/23 2302   ipratropium-albuterol (DUONEB) 0.5-2.5 (3) MG/3ML nebulizer solution 3 mL, 3 mL, Nebulization, Q6H, Sharl Ma, Sarina Ill, MD, 3 mL at 04/30/23 0801   irbesartan (AVAPRO) tablet 150 mg, 150 mg, Oral, Daily, Sharl Ma, Gagan S, MD, 150 mg at 04/29/23 1030   midodrine (PROAMATINE) tablet 5 mg, 5 mg, Oral, Once per day on Mon Wed Fri, [place], Gagan S, MD   multivitamin (RENA-VIT) tablet 1 tablet, 1 tablet, Oral, QHS, Sharl Ma, Sarina Ill, MD, 1 tablet at 04/29/23 2304   ondansetron (ZOFRAN) tablet 4 mg, 4 mg, Oral, Q6H PRN, 4 mg at 04/23/23 2346 **OR** ondansetron (ZOFRAN) injection 4 mg, 4 mg, Intravenous,  Q6H PRN, Sharl Ma, Sarina Ill, MD, 4 mg at 04/27/23 0043   pantoprazole (PROTONIX) EC tablet 40 mg, 40 mg, Oral, Daily, Sharl Ma, Gagan S, MD, 40 mg at 04/29/23 1610   sevelamer carbonate (RENVELA) tablet 1,600 mg, 1,600 mg, Oral, TID WC, Sharl Ma, Gagan S, MD, 1,600 mg at 04/29/23 1721   traZODone (DESYREL) tablet 50 mg, 50 mg, Oral, Once per day on Mon Wed Fri, Lama, Gagan S, MD, 50 mg at 04/27/23 2247    ALLERGIES   Patient has no known allergies.     REVIEW OF SYSTEMS    Review of Systems:  Gen:  Denies  fever, sweats, chills weigh loss  HEENT: Denies blurred vision, double vision, ear pain, eye pain, hearing loss, nose bleeds, sore throat Cardiac:  No dizziness, chest pain or heaviness, chest tightness,edema Resp:   reports dyspnea chronically  Gi: Denies swallowing difficulty, stomach pain, nausea or vomiting, diarrhea, constipation, bowel incontinence Gu:  Denies bladder incontinence, burning urine Ext:   Denies Joint pain, stiffness or swelling Skin: Denies  skin rash, easy bruising or bleeding or hives Endoc:  Denies polyuria, polydipsia , polyphagia or weight change Psych:   Denies depression, insomnia or hallucinations   Other:  All other systems negative   VS: BP 133/71 (BP Location: Right Arm)   Pulse 79   Temp 98.7 F (37.1 C) (Oral)   Resp 16   Ht 5\' 3"  (1.6 m)   Wt 64.5 kg   SpO2 97%   BMI 25.19 kg/m      PHYSICAL EXAM    GENERAL:NAD, no fevers, chills, no weakness no fatigue HEAD: Normocephalic, atraumatic.  EYES: Pupils equal, round, reactive to light. Extraocular muscles intact. No scleral icterus.  MOUTH: Moist mucosal membrane. Dentition intact. No abscess noted.  EAR, NOSE, THROAT: Clear without exudates. No external lesions.  NECK: Supple. No thyromegaly. No nodules. No JVD.  PULMONARY: decreased breath sounds with mild rhonchi worse at bases bilaterally.  CARDIOVASCULAR: S1 and S2. Regular rate and rhythm. No murmurs, rubs, or gallops. No edema. Pedal  pulses 2+ bilaterally.  GASTROINTESTINAL: Soft, nontender, nondistended. No masses. Positive bowel sounds. No hepatosplenomegaly.  MUSCULOSKELETAL: No swelling, clubbing, or edema. Range of motion full in all extremities.  NEUROLOGIC: Cranial nerves II through XII are intact. No gross focal neurological deficits. Sensation intact. Reflexes intact.  SKIN: No ulceration, lesions, rashes, or cyanosis. Skin warm and dry.  Turgor intact.  PSYCHIATRIC: Mood, affect within normal limits. The patient is awake, alert and oriented x 3. Insight, judgment intact.       IMAGING   Narrative & Impression  CLINICAL DATA:  Atelectasis.   EXAM: PORTABLE CHEST 1 VIEW   COMPARISON:  Radiograph and CT 04/23/2023   FINDINGS: Tracheostomy tube tip at the thoracic inlet. Median sternotomy and prosthetic cardiac valve. Stable cardiomegaly. Improvement in right pleural effusion. Similar left pleural effusion and basilar opacity. Pulmonary edema is unchanged. No pneumothorax.   IMPRESSION: 1. Improvement in right pleural effusion. 2. Unchanged left pleural effusion and basilar opacity. 3. Unchanged pulmonary edema.       ASSESSMENT/PLAN     Acute on chronic hypoxemic respiratory failure Status post chest x-ray-interval changes with improved right lower lobe however left hemithorax with interstitial edema and atelectasis. -CRP is within reference range suggestive of absence of inflammatory changes in low likelihood of infectious or inflammatory pneumonia, there is a very mild leukocytosis 11.3 which may be due to underlying GI cholera.    Bibasilar atelectasis -Due to compressive effects with surrounding pleural effusion as well as chronic deconditioning and interstitial edema with development of atelectatic segments.  Patient should start chronic physical therapy and Occupational Therapy with pulmonary rehab on outpatient basis, in the interim recruitment maneuvers with MetaNeb device utilizing  albuterol as well as utilization of chest physiotherapy with incentive spirometry each hour.  continue inpatient physical and occupational therapy Patient is also on DuoNeb nebulizer therapy which should also help treat atelectasis.   Interstitial edema -Nephrology is on case patient is currently being dialyzed -Complex fluid management due to ESRD     Thank you for allowing me to participate in the care of this patient.   Patient/Family are satisfied with care plan and all questions have been answered.    Provider disclosure: Patient with at least one acute or chronic illness or injury that poses a threat to life or bodily function and is being managed actively during this encounter.  All of the below services have been performed independently by signing provider:  review of prior documentation from internal and or external health records.  Review of previous and current lab results.  Interview and comprehensive assessment during patient visit today. Review of current and previous chest radiographs/CT scans. Discussion of management and test interpretation with health care team and patient/family.   This document was prepared using Dragon voice recognition software and may include unintentional dictation errors.     Vida Rigger, M.D.  Division of Pulmonary & Critical Care Medicine

## 2023-04-30 NOTE — Progress Notes (Signed)
  Received patient in bed to unit.   Informed consent signed and in chart.    TX duration:3.5 hrs     Transported back to floor  Hand-off given to patient's nurse. No c/o and no distress noted    Access used: LAVG Access issues: none   Total UF removed: 1.5kg Medication(s) given:  Post HD VS: 150/70 Post HD weight: 63.0kg     Lynann Beaver  [facility]

## 2023-04-30 NOTE — Progress Notes (Signed)
[facility]  PROGRESS NOTE  Deborah [name]:[id number] DOB: 03/04/1956 DOA: 04/23/2023 PCP: Si Gaul, MD   Brief HPI:   67 year old African-American female history of end-stage renal disease on dialysis Monday Wednesday Friday, paroxysmal atrial fibrillation on Eliquis, history of tracheostomy, chronic diastolic heart failure, chronic hypoxia at night, uncontrolled type 2 diabetes, hypertension, presents the ER today with worsening cough and hypoxia.  Patient was recently in the hospital and discharged 5/19 after treatment for pneumonia. Chest x-ray showed significant volume overload, patient was dialyzed after admission 5/20.     Assessment/Plan:    Acute on chronic respiratory failure with hypoxia -Tracheostomy status -CT chest showed vascular congestion/volume overload with acute on chronic diastolic heart failure -Nephrology consulted for dialysis -Concern for aspiration, patient has tracheostomy -Swallow evaluation obtained, recommend regular consistency diet with thin liquids, aspiration precautions -Pulmonology consulted; started on inpatient physical and occupational therapy, and recommend outpatient PT/OT with pulmonary rehab.  Started on chest PT and incentive spirometry for bibasilar atelectasis   Community-acquired pneumonia -Had been on cefepime during previous hospital stay and was discharged on Levaquin; family never filled Levaquin prescription - cefepime has been restarted -Sputum culture also grew Corynebacterium species -ID was consulted;  2 d echo obtained to evaluate the functioning of the valve.  This revealed normal valves.  EF of 55-60.  Ventricular diastolic dysfunction  -Pseudomonas in -tracheal aspirate, because of SOB, recurrent hospitalization  - will treat as pseudomonas resp infection with cefepime for 10 days-total.  Can be given during dialysis.  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 Algis Liming; no need to treat as patient is not having diarrhea anymore  ESRD -Patient on hemodialysis  Mitral valve replacement -2D echo ordered  Paroxysmal atrial fibrillation -Continue Eliquis, Coreg  Diabetes mellitus type 2 -Continue Levemir, sliding scale insulin with NovoLog -CBG well-controlled  Hypertension -Blood pressure is stable    Medications     amLODipine  10 mg Oral Daily   apixaban  2.5 mg Oral BID   aspirin EC  81 mg Oral Daily   atorvastatin  80 mg Oral q1800   carvedilol  25 mg Oral BID   Chlorhexidine Gluconate Cloth  6 each Topical Q0600   diphenhydrAMINE  12.5 mg Oral Once   epoetin (EPOGEN/PROCRIT) injection  10,000 Units Intravenous Q M,W,F-HD   escitalopram  10 mg Oral Daily   gabapentin  300 mg Oral QHS   insulin aspart  0-5 Units Subcutaneous QHS   insulin aspart  0-6 Units Subcutaneous TID WC   insulin detemir  4 Units Subcutaneous BID   ipratropium-albuterol  3 mL Nebulization Q6H   irbesartan  150 mg Oral Daily   midodrine  5 mg Oral Once per day on Mon Wed Fri   multivitamin  1 tablet Oral QHS   pantoprazole  40 mg Oral Daily   sevelamer carbonate  1,600 mg Oral TID WC   traZODone  50 mg Oral Once per day on Mon Wed Fri     Data Reviewed:   CBG:  Recent Labs  Lab 04/29/23 0855 04/29/23 1209 04/29/23 1657 04/29/23 2052 04/30/23 0746  GLUCAP 174* 224* 264* 130* 119*    SpO2: 97 % O2 Flow Rate (L/min): 10 L/min FiO2 (%): 50 %    Vitals:   04/30/23 1200 04/30/23 1230 04/30/23 1300 04/30/23 1318  BP: (!) 152/67 (!) 143/70 (!) 146/71 (!) 150/70  Pulse: 86 87 89 88  Resp: 18 20 (!) 21 20  Temp:    98.4 F (36.9 C)  TempSrc:    Oral  SpO2: 98% 99% 97% 97%  Weight:      Height:          Data Reviewed:  Basic Metabolic Panel: Recent Labs  Lab 04/25/23 0659 04/26/23 0903 04/27/23 0416 04/28/23 0419 04/30/23 1000  NA 139 136 134* 137 135   K 4.5 4.3 4.4 4.2 3.8  CL 102 97* 97* 98 98  CO2 27 28 28 29 24   GLUCOSE 134* 113* 167* 113* 150*  BUN 47* 31* 43* 22 51*  CREATININE 8.49* 6.29* 8.10* 5.51* 10.47*  CALCIUM 8.7* 8.8* 8.4* 8.6* 8.7*  PHOS 3.8  --   --   --  4.3    CBC: Recent Labs  Lab 04/24/23 0443 04/25/23 0659 04/26/23 0903 04/27/23 0416 04/28/23 0419 04/30/23 1000  WBC 12.1* 10.3 9.6 11.2* 10.5 11.3*  NEUTROABS 10.4*  --   --   --   --   --   HGB 8.0* 7.4* 7.9* 7.4* 7.3* 7.6*  HCT 23.6* 22.6* 23.2* 22.2* 22.2* 23.0*  MCV 91.5 91.9 89.6 91.4 91.7 92.0  PLT 193 198 216 211 186 245    LFT Recent Labs  Lab 04/24/23 0443 04/25/23 0659 04/30/23 1000  AST 17  --   --   ALT 21  --   --   ALKPHOS 65  --   --   BILITOT 1.5*  --   --   PROT 6.2*  --   --   ALBUMIN 3.2* 2.9* 2.9*     Antibiotics: Anti-infectives (From admission, onward)    Start     Dose/Rate Route Frequency Ordered Stop   04/23/23 2200  ceFEPIme (MAXIPIME) 1 g in sodium chloride 0.9 % 100 mL IVPB        1 g 200 mL/hr over 30 Minutes Intravenous Every 24 hours 04/23/23 2123 04/30/23 0006   04/23/23 2015  ceFEPIme (MAXIPIME) 2 g in sodium chloride 0.9 % 100 mL IVPB  Status:  Discontinued        2 g 200 mL/hr over 30 Minutes Intravenous  Once 04/23/23 2012 04/23/23 2123   04/23/23 2015  azithromycin (ZITHROMAX) 500 mg in sodium chloride 0.9 % 250 mL IVPB        500 mg 250 mL/hr over 60 Minutes Intravenous  Once 04/23/23 2012 04/24/23 0327        DVT prophylaxis: Apixaban  Code Status: Full code  Family Communication: No family at bedside   CONSULTS nephrology   Subjective   Patient seen, denies shortness of breath.  Still requiring 10 L/min of oxygen via nasal cannula.  Had 2 loose BM today.  Objective    Physical Examination:  General-appears in no acute distress Heart-S1-S2, regular, no murmur auscultated Lungs-clear to auscultation bilaterally, no wheezing or crackles auscultated Abdomen-soft, nontender, no  organomegaly Extremities-no edema in the lower extremities Neuro-alert, oriented x3, no focal deficit noted  Status is: Inpatient:             [name]   [facility] If 7PM-7AM, please contact night-coverage at [email or web address], Office  [phone]   04/30/2023, 1:32 PM  LOS: 6 days

## 2023-04-30 NOTE — Progress Notes (Signed)
[facility]  ROUNDING NOTE   Subjective:   Deborah [name] returns to [facility] on 04/23/2023 for Acute on chronic respiratory failure with hypoxia ([name]) [J96.21] Acute on chronic diastolic (congestive) heart failure ([name]) [I50.33]  Patient is known to our practice from previous admissions and receives outpatient dialysis at [facility] on a MWF schedule, supervised by [facility] physicians.  Patient seen and evaluated during dialysis   HEMODIALYSIS FLOWSHEET:  Blood Flow Rate (mL/min): 400 mL/min Arterial Pressure (mmHg): -120 mmHg Venous Pressure (mmHg): 190 mmHg TMP (mmHg): 14 mmHg Ultrafiltration Rate (mL/min): 868 mL/min Dialysate Flow Rate (mL/min): 300 ml/min  Remains on 10 L trach collar States she feels respiratory status has improved.   Objective:  Vital signs in last 24 hours:  Temp:  [97.8 F (36.6 C)-99.2 F (37.3 C)] 98.7 F (37.1 C) (05/27 0915) Pulse Rate:  [74-86] 86 (05/27 1130) Resp:  [16-20] 18 (05/27 1130) BP: (115-144)/(56-77) 133/68 (05/27 1130) SpO2:  [89 %-99 %] 95 % (05/27 1130) FiO2 (%):  [40 %-50 %] 50 % (05/27 0802) Weight:  [64.5 kg] 64.5 kg (05/27 0942)  Weight change:  Filed Weights   04/27/23 0918 04/27/23 1556 04/30/23 0942  Weight: 65.8 kg 62.7 kg 64.5 kg    Intake/Output: I/O last 3 completed shifts: In: 1010.2 [P.O.:767; IV Piggyback:243.2] Out: -    Intake/Output this shift:  No intake/output data recorded.  Physical Exam: General: NAD  Head: Normocephalic, atraumatic. Moist oral mucosal membranes  Eyes: Anicteric  Neck: +tracheostomy  Lungs:  Diminished in bases   Heart: Regular rate and rhythm  Abdomen:  Soft, nontender  Extremities: No peripheral edema.  Neurologic: Nonfocal, moving all four extremities  Skin: No lesions  Access: Left AVG    Basic Metabolic Panel: Recent Labs  Lab 04/25/23 0659 04/26/23 0903 04/27/23 0416 04/28/23 0419 04/30/23 1000  NA 139 136 134* 137 135  K 4.5 4.3 4.4  4.2 3.8  CL 102 97* 97* 98 98  CO2 27 28 28 29 24   GLUCOSE 134* 113* 167* 113* 150*  BUN 47* 31* 43* 22 51*  CREATININE 8.49* 6.29* 8.10* 5.51* 10.47*  CALCIUM 8.7* 8.8* 8.4* 8.6* 8.7*  PHOS 3.8  --   --   --  4.3     Liver Function Tests: Recent Labs  Lab 04/24/23 0443 04/25/23 0659 04/30/23 1000  AST 17  --   --   ALT 21  --   --   ALKPHOS 65  --   --   BILITOT 1.5*  --   --   PROT 6.2*  --   --   ALBUMIN 3.2* 2.9* 2.9*    No results for input(s): "LIPASE", "AMYLASE" in the last 168 hours. No results for input(s): "AMMONIA" in the last 168 hours.  CBC: Recent Labs  Lab 04/24/23 0443 04/25/23 0659 04/26/23 0903 04/27/23 0416 04/28/23 0419 04/30/23 1000  WBC 12.1* 10.3 9.6 11.2* 10.5 11.3*  NEUTROABS 10.4*  --   --   --   --   --   HGB 8.0* 7.4* 7.9* 7.4* 7.3* 7.6*  HCT 23.6* 22.6* 23.2* 22.2* 22.2* 23.0*  MCV 91.5 91.9 89.6 91.4 91.7 92.0  PLT 193 198 216 211 186 245     Cardiac Enzymes: No results for input(s): "CKTOTAL", "CKMB", "CKMBINDEX", "TROPONINI" in the last 168 hours.  BNP: Invalid input(s): "POCBNP"  CBG: Recent Labs  Lab 04/29/23 0855 04/29/23 1209 04/29/23 1657 04/29/23 2052 04/30/23 0746  GLUCAP 174* 224* 264*  130* 119*     Microbiology: Results for orders placed or performed during the hospital encounter of 04/23/23  SARS Coronavirus 2 by RT PCR (hospital order, performed in [facility] hospital lab) *cepheid single result test* Anterior Nasal Swab     Status: None   Collection Time: 04/23/23  9:30 AM   Specimen: Anterior Nasal Swab  Result Value Ref Range Status   SARS Coronavirus 2 by RT PCR NEGATIVE NEGATIVE Final    Comment: (NOTE) SARS-CoV-2 target nucleic acids are NOT DETECTED.  The SARS-CoV-2 RNA is generally detectable in upper and lower respiratory specimens during the acute phase of infection. The lowest concentration of SARS-CoV-2 viral copies this assay can detect is 250 copies / mL. A negative result does not  preclude SARS-CoV-2 infection and should not be used as the sole basis for treatment or other patient management decisions.  A negative result may occur with improper specimen collection / handling, submission of specimen other than nasopharyngeal swab, presence of viral mutation(s) within the areas targeted by this assay, and inadequate number of viral copies (<250 copies / mL). A negative result must be combined with clinical observations, patient history, and epidemiological information.  Fact Sheet for Patients:   RoadLapTop.co.za  Fact Sheet for Healthcare Providers: http://kim-miller.com/  This test is not yet approved or  cleared by the [place] FDA and has been authorized for detection and/or diagnosis of SARS-CoV-2 by FDA under an Emergency Use Authorization (EUA).  This EUA will remain in effect (meaning this test can be used) for the duration of the COVID-19 declaration under Section 564(b)(1) of the Act, 21 U.S.C. section 360bbb-3(b)(1), unless the authorization is terminated or revoked sooner.  Performed at [facility], [address] [place], [place] [address]   Culture, blood (Routine X 2) w Reflex to ID Panel     Status: None   Collection Time: 04/24/23  7:52 PM   Specimen: BLOOD  Result Value Ref Range Status   Specimen Description BLOOD RIGHT ANTECUBITAL  Final   Special Requests   Final    BOTTLES DRAWN AEROBIC AND ANAEROBIC Blood Culture adequate volume   Culture   Final    NO GROWTH 5 DAYS Performed at [facility], [address] [place], [place] [address]    Report Status 04/29/2023 FINAL  Final  Culture, blood (Routine X 2) w Reflex to ID Panel     Status: None   Collection Time: 04/24/23  7:56 PM   Specimen: BLOOD  Result Value Ref Range Status   Specimen Description BLOOD RIGHT ANTECUBITAL  Final   Special Requests   Final    BOTTLES DRAWN AEROBIC AND ANAEROBIC Blood Culture  adequate volume   Culture   Final    NO GROWTH 5 DAYS Performed at [facility], [address] [place], [place] [address]    Report Status 04/29/2023 FINAL  Final  Respiratory (~20 pathogens) panel by PCR     Status: None   Collection Time: 04/25/23  2:01 PM   Specimen: Nasopharyngeal Swab; Respiratory  Result Value Ref Range Status   Adenovirus NOT DETECTED NOT DETECTED Final   Coronavirus 229E NOT DETECTED NOT DETECTED Final    Comment: (NOTE) The Coronavirus on the Respiratory Panel, DOES NOT test for the novel  Coronavirus (2019 nCoV)    Coronavirus HKU1 NOT DETECTED NOT DETECTED Final   Coronavirus NL63 NOT DETECTED NOT DETECTED Final   Coronavirus OC43 NOT DETECTED NOT DETECTED Final  Metapneumovirus NOT DETECTED NOT DETECTED Final   Rhinovirus / Enterovirus NOT DETECTED NOT DETECTED Final   Influenza A NOT DETECTED NOT DETECTED Final   Influenza B NOT DETECTED NOT DETECTED Final   Parainfluenza Virus 1 NOT DETECTED NOT DETECTED Final   Parainfluenza Virus 2 NOT DETECTED NOT DETECTED Final   Parainfluenza Virus 3 NOT DETECTED NOT DETECTED Final   Parainfluenza Virus 4 NOT DETECTED NOT DETECTED Final   Respiratory Syncytial Virus NOT DETECTED NOT DETECTED Final   Bordetella pertussis NOT DETECTED NOT DETECTED Final   Bordetella Parapertussis NOT DETECTED NOT DETECTED Final   Chlamydophila pneumoniae NOT DETECTED NOT DETECTED Final   Mycoplasma pneumoniae NOT DETECTED NOT DETECTED Final    Comment: Performed at [facility], [address], [place], [place] [address]  Gastrointestinal Panel by PCR , Stool     Status: Abnormal   Collection Time: 04/26/23  6:56 AM   Specimen: Stool  Result Value Ref Range Status   Campylobacter species NOT DETECTED NOT DETECTED Final   Plesimonas shigelloides NOT DETECTED NOT DETECTED Final   Salmonella species NOT DETECTED NOT DETECTED Final   Yersinia enterocolitica NOT DETECTED NOT DETECTED Final   Vibrio species  DETECTED (A) NOT DETECTED Final    Comment: RESULT CALLED TO, READ BACK BY AND VERIFIED WITH: JOSEPH HAYES AT 6045 04/29/23.PMF    Vibrio cholerae DETECTED (A) NOT DETECTED Final    Comment: RESULT CALLED TO, READ BACK BY AND VERIFIED WITH: JOSEPH HAYES AT 4098 04/29/23.PMF    Enteroaggregative E coli (EAEC) NOT DETECTED NOT DETECTED Final   Enteropathogenic E coli (EPEC) NOT DETECTED NOT DETECTED Final   Enterotoxigenic E coli (ETEC) NOT DETECTED NOT DETECTED Final   Shiga like toxin producing E coli (STEC) NOT DETECTED NOT DETECTED Final   Shigella/Enteroinvasive E coli (EIEC) NOT DETECTED NOT DETECTED Final   Cryptosporidium NOT DETECTED NOT DETECTED Final   Cyclospora cayetanensis NOT DETECTED NOT DETECTED Final   Entamoeba histolytica NOT DETECTED NOT DETECTED Final   Giardia lamblia NOT DETECTED NOT DETECTED Final   Adenovirus F40/41 NOT DETECTED NOT DETECTED Final   Astrovirus NOT DETECTED NOT DETECTED Final   Norovirus GI/GII NOT DETECTED NOT DETECTED Final   Rotavirus A NOT DETECTED NOT DETECTED Final   Sapovirus (I, II, IV, and V) NOT DETECTED NOT DETECTED Final    Comment: Performed at [facility], [address] [place], [place] [address]    Coagulation Studies: No results for input(s): "LABPROT", "INR" in the last 72 hours.   Urinalysis: No results for input(s): "COLORURINE", "LABSPEC", "PHURINE", "GLUCOSEU", "HGBUR", "BILIRUBINUR", "KETONESUR", "PROTEINUR", "UROBILINOGEN", "NITRITE", "LEUKOCYTESUR" in the last 72 hours.  Invalid input(s): "APPERANCEUR"    Imaging: DG Chest Port 1 View  Result Date: 04/29/2023 CLINICAL DATA:  Atelectasis. EXAM: PORTABLE CHEST 1 VIEW COMPARISON:  Radiograph and CT 04/23/2023 FINDINGS: Tracheostomy tube tip at the thoracic inlet. Median sternotomy and prosthetic cardiac valve. Stable cardiomegaly. Improvement in right pleural effusion. Similar left pleural effusion and basilar opacity. Pulmonary edema is unchanged. No  pneumothorax. IMPRESSION: 1. Improvement in right pleural effusion. 2. Unchanged left pleural effusion and basilar opacity. 3. Unchanged pulmonary edema. Electronically Signed   By: Narda Rutherford M.D.   On: 04/29/2023 21:18     Medications:    sodium chloride 5 mL/hr at 04/27/23 2257     amLODipine  10 mg Oral Daily   apixaban  2.5 mg Oral BID   aspirin EC  81 mg Oral Daily  atorvastatin  80 mg Oral q1800   carvedilol  25 mg Oral BID   Chlorhexidine Gluconate Cloth  6 each Topical Q0600   diphenhydrAMINE  12.5 mg Oral Once   epoetin (EPOGEN/PROCRIT) injection  10,000 Units Intravenous Q M,W,F-HD   escitalopram  10 mg Oral Daily   gabapentin  300 mg Oral QHS   insulin aspart  0-5 Units Subcutaneous QHS   insulin aspart  0-6 Units Subcutaneous TID WC   insulin detemir  4 Units Subcutaneous BID   ipratropium-albuterol  3 mL Nebulization Q6H   irbesartan  150 mg Oral Daily   midodrine  5 mg Oral Once per day on Mon Wed Fri   multivitamin  1 tablet Oral QHS   pantoprazole  40 mg Oral Daily   sevelamer carbonate  1,600 mg Oral TID WC   traZODone  50 mg Oral Once per day on Mon Wed Fri   sodium chloride, acetaminophen **OR** acetaminophen, guaiFENesin-dextromethorphan, ondansetron **OR** ondansetron (ZOFRAN) IV  Assessment/ Plan:  Deborah [name] is a 67 y.o.  female with end stage renal disease on hemodialysis, tracheostomy, hypertension, hyperlipidemia, diabetes mellitus type II insulin dependent, depression, and diabetic neuropathy who presents to [facility] for 04/23/2023 for Acute on chronic respiratory failure with hypoxia ([name]) [J96.21] Acute on chronic diastolic (congestive) heart failure ([name]) [I50.33]  [facility] MWF [address] Left AVG 62kg  End Stage Kidney Disease:  -  Receiving dialysis today, UF 1.5L as tolerated. Next treatment scheduled for Wednesday.   Hypertension with chronic kidney disease: current regimen of irbesartan, carvedilol and amlodipine.  Home regimen of olmesartan instead of irbesartan. Also prescribed Midodrine to prevent hypotension during dialysis.   -Blood pressure stable during dialysis, no need for Midodrine at this time.   Diabetes mellitus type II with chronic kidney disease: insulin dependent.   Sliding scale insulin managed by primary team  Secondary Hyperparathyroidism - Continue sevelamer with meals  Anemia of chronic kidney disease: -Continue EPO with dialysis   LOS: 6   5/27/202411:54 AM

## 2023-05-01 DIAGNOSIS — N186 End stage renal disease: Secondary | ICD-10-CM | POA: Diagnosis not present

## 2023-05-01 DIAGNOSIS — I5033 Acute on chronic diastolic (congestive) heart failure: Secondary | ICD-10-CM | POA: Diagnosis not present

## 2023-05-01 DIAGNOSIS — A048 Other specified bacterial intestinal infections: Secondary | ICD-10-CM

## 2023-05-01 DIAGNOSIS — J9621 Acute and chronic respiratory failure with hypoxia: Secondary | ICD-10-CM | POA: Diagnosis not present

## 2023-05-01 DIAGNOSIS — A09 Infectious gastroenteritis and colitis, unspecified: Secondary | ICD-10-CM

## 2023-05-01 DIAGNOSIS — J189 Pneumonia, unspecified organism: Secondary | ICD-10-CM | POA: Diagnosis not present

## 2023-05-01 LAB — GLUCOSE, CAPILLARY
Glucose-Capillary: 76 mg/dL (ref 70–99)
Glucose-Capillary: 196 mg/dL — ABNORMAL HIGH (ref 70–99)
Glucose-Capillary: 202 mg/dL — ABNORMAL HIGH (ref 70–99)
Glucose-Capillary: 212 mg/dL — ABNORMAL HIGH (ref 70–99)

## 2023-05-01 MED ORDER — IPRATROPIUM-ALBUTEROL 0.5-2.5 (3) MG/3ML IN SOLN
3.0000 mL | Freq: Three times a day (TID) | RESPIRATORY_TRACT | Status: DC
  Administered 2023-05-01 – 2023-05-09 (×25): 3 mL via RESPIRATORY_TRACT
  Filled 2023-05-01 (×23): qty 3

## 2023-05-01 MED ORDER — CARVEDILOL 25 MG PO TABS
25.0000 mg | ORAL_TABLET | Freq: Every day | ORAL | Status: DC
  Administered 2023-05-03: 25 mg via ORAL
  Filled 2023-05-01: qty 1

## 2023-05-01 MED ORDER — LOPERAMIDE HCL 2 MG PO CAPS
2.0000 mg | ORAL_CAPSULE | ORAL | Status: DC | PRN
  Administered 2023-05-01 (×2): 2 mg via ORAL
  Filled 2023-05-01 (×2): qty 1

## 2023-05-01 MED ORDER — AZITHROMYCIN 1 G PO PACK
1.0000 g | PACK | Freq: Once | ORAL | Status: DC
  Filled 2023-05-01: qty 1

## 2023-05-01 MED ORDER — FUROSEMIDE 10 MG/ML IJ SOLN
80.0000 mg | Freq: Every day | INTRAMUSCULAR | Status: DC
  Administered 2023-05-01 – 2023-05-03 (×2): 80 mg via INTRAVENOUS
  Filled 2023-05-01 (×2): qty 8

## 2023-05-01 NOTE — Progress Notes (Addendum)
ID I had signed off last week Seeing her again as stool positive for vibrio cholera  in PCR 2 semi loose stools/day No fever or chills No travel history No sick contacts No recent poorly cooked or raw shell fish  consumption Finished cefepime for pseudomonas resp infection on 5/26  O/e awake and alert Patient Vitals for the past 24 hrs:  BP Temp Temp src Pulse Resp SpO2 Weight  05/01/23 1300 -- -- -- -- -- 100 % --  05/01/23 0817 126/62 -- Oral -- -- 98 % --  05/01/23 0732 -- -- -- -- -- 96 % --  05/01/23 0500 -- -- -- -- -- -- 64.1 kg  05/01/23 0426 (!) 117/58 97.6 F (36.4 C) Oral 71 19 90 % --  05/01/23 0147 -- -- -- -- -- 90 % --  05/01/23 0011 (!) 113/56 98.7 F (37.1 C) Oral 79 17 91 % --  04/30/23 2149 -- -- -- -- -- 93 % --  04/30/23 2139 -- -- -- -- -- 90 % --  04/30/23 2049 128/60 98.9 F (37.2 C) Oral 78 18 90 % --   Tracheostomy Chest b/l air entry [id number] Abd soft CNS non focal  Labs    Latest Ref Rng & Units 04/30/2023   10:00 AM 04/28/2023    4:19 AM 04/27/2023    4:16 AM  CBC  WBC 4.0 - 10.5 K/uL 11.3  10.5  11.2   Hemoglobin 12.0 - 15.0 g/dL 7.6  7.3  7.4   Hematocrit 36.0 - 46.0 % 23.0  22.2  22.2   Platelets 150 - 400 K/uL 245  186  211        Latest Ref Rng & Units 04/30/2023   10:00 AM 04/28/2023    4:19 AM 04/27/2023    4:16 AM  CMP  Glucose 70 - 99 mg/dL 161  096  045   BUN 8 - 23 mg/dL 51  22  43   Creatinine 0.44 - 1.00 mg/dL 40.98  1.19  1.47   Sodium 135 - 145 mmol/L 135  137  134   Potassium 3.5 - 5.1 mmol/L 3.8  4.2  4.4   Chloride 98 - 111 mmol/L 98  98  97   CO2 22 - 32 mmol/L 24  29  28    Calcium 8.9 - 10.3 mg/dL 8.7  8.6  8.4     micro BC NG  Assessment /plan  Acute hypoxic resp failure Pulmonary edema CHF Pseudomonas resp infection Completed 10 days of IV cefepime  ESRD on dialysis  Minimal diarrhea- no dehydration  had GI panel PCR and that shows vibrio cholera- dont know the significance of this test in this  setting..  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

## 2023-05-01 NOTE — TOC Progression Note (Addendum)
Transition of Care [facility]) - Progression Note    Patient Details  Name: BRIEONA MULHALL MRN: [id number] Date of Birth: Sep 23, 1956  Transition of Care [facility]) CM/SW Contact  Margarito Liner, LCSW Phone Number: 05/01/2023, 9:51 AM  Clinical Narrative: TOC continues to follow for disposition needs.    10:30 am: Received call from Rob with [facility]. Provided update.  Expected Discharge Plan: Home w Home Health Services Barriers to Discharge: Continued Medical Work up  Expected Discharge Plan and Services     Post Acute Care Choice: Resumption of Svcs/PTA Provider Living arrangements for the past 2 months: Apartment                                       Social Determinants of Health (SDOH) Interventions SDOH Screenings   Food Insecurity: No Food Insecurity (04/24/2023)  Housing: Patient Declined (04/24/2023)  Transportation Needs: No Transportation Needs (04/24/2023)  Utilities: Not At Risk (04/24/2023)  Tobacco Use: Low Risk  (04/24/2023)    Readmission Risk Interventions    04/27/2023    4:18 PM 11/04/2020    9:20 AM  Readmission Risk Prevention Plan  Transportation Screening Complete Complete  PCP or Specialist Appt within 3-5 Days Complete   HRI or Home Care Consult Complete   Social Work Consult for Recovery Care Planning/Counseling Complete   Palliative Care Screening Not Applicable   Medication Review Oceanographer) Complete   PCP or Specialist appointment within 3-5 days of discharge  Complete  SW Recovery Care/Counseling Consult  Complete  Palliative Care Screening  Not Applicable  Skilled Nursing Facility  Not Applicable

## 2023-05-01 NOTE — Progress Notes (Addendum)
[facility]  PROGRESS NOTE  Deborah [name]:[id number] DOB: [birth year] DOA: 04/23/2023 PCP: Si Gaul, MD   Brief HPI:   67 year old African-American female history of end-stage renal disease on dialysis Monday Wednesday Friday, paroxysmal atrial fibrillation on Eliquis, history of tracheostomy, chronic diastolic heart failure, chronic hypoxia at night, uncontrolled type 2 diabetes, hypertension, presents the ER today with worsening cough and hypoxia.  Patient was recently in the hospital and discharged 5/19 after treatment for pneumonia. Chest x-ray showed significant volume overload, patient was dialyzed after admission 5/20.     Assessment/Plan:    Acute on chronic respiratory failure with hypoxia -Continue to require 10 L/min oxygen; usually less than 5 L/min at home -Tracheostomy status -CT chest showed vascular congestion/volume overload with acute on chronic diastolic heart failure -Nephrology consulted for dialysis -Concern for aspiration, patient has tracheostomy -Swallow evaluation obtained, recommend regular consistency diet with thin liquids, aspiration precautions -Pulmonology consulted; started on inpatient physical and occupational therapy, and recommend outpatient PT/OT with pulmonary rehab.  Started on chest PT and incentive spirometry for bibasilar atelectasis.  -Chest x-ray obtained today shows unchanged left pleural effusion and basilar opacity, unchanged pulmonary edema, improvement right pleural effusion -Lasix 80 mg IV daily ordered per pulmonology   Community-acquired pneumonia -Had been on cefepime during previous hospital stay and was discharged on Levaquin; family never filled Levaquin prescription - cefepime has been restarted -Sputum culture also grew Corynebacterium species -ID was consulted;  2 d echo obtained to evaluate the functioning of the valve.  This revealed normal valves.  EF of 55-60.  Ventricular diastolic dysfunction  -Pseudomonas  in -tracheal aspirate, because of SOB, recurrent hospitalization  - will treat as pseudomonas resp infection with cefepime for 10 days-total.  Can be given during dialysis.  Completed on 04/29/2023  Diarrhea -Still 2-3 loose BMs every day -GI pathogen panel obtained, came back positive for vibrio cholerae -Called and discussed with ID, Dr. Rivka Safer -She recommends to obtain stool culture and treat with azithromycin 1 g p.o. x 1.  Will give Zithromax after stool cultures obtained.  ESRD -Patient on hemodialysis  Mitral valve replacement -2D echo showed normal functioning valve, EF 55 to 60%  Paroxysmal atrial fibrillation -Continue Eliquis, Coreg  Diabetes mellitus type 2 -Continue Levemir, sliding scale insulin with NovoLog -CBG well-controlled  Hypertension -Blood pressure is stable    Medications     amLODipine  10 mg Oral Daily   apixaban  2.5 mg Oral BID   aspirin EC  81 mg Oral Daily   atorvastatin  80 mg Oral q1800   carvedilol  25 mg Oral BID   Chlorhexidine Gluconate Cloth  6 each Topical Q0600   epoetin (EPOGEN/PROCRIT) injection  10,000 Units Intravenous Q M,W,F-HD   escitalopram  10 mg Oral Daily   gabapentin  300 mg Oral QHS   insulin aspart  0-5 Units Subcutaneous QHS   insulin aspart  0-6 Units Subcutaneous TID WC   insulin detemir  4 Units Subcutaneous BID   ipratropium-albuterol  3 mL Nebulization Q6H   irbesartan  150 mg Oral Daily   midodrine  5 mg Oral Once per day on Mon Wed Fri   multivitamin  1 tablet Oral QHS   pantoprazole  40 mg Oral Daily   sevelamer carbonate  1,600 mg Oral TID WC   traZODone  50 mg Oral Once per day on Mon Wed Fri     Data Reviewed:   CBG:  Recent Labs  Lab 04/29/23  2052 04/30/23 0746 04/30/23 1532 04/30/23 2052 05/01/23 0750  GLUCAP 130* 119* 117* 109* 76    SpO2: 96 % O2 Flow Rate (L/min): 10 L/min FiO2 (%): 40 %    Vitals:   05/01/23 0147 05/01/23 0426 05/01/23 0500 05/01/23 0732  BP:  (!) 117/58     Pulse:  71    Resp:  19    Temp:  97.6 F (36.4 C)    TempSrc:  Oral    SpO2: 90% 90%  96%  Weight:   64.1 kg   Height:          Data Reviewed:  Basic Metabolic Panel: Recent Labs  Lab 04/25/23 0659 04/26/23 0903 04/27/23 0416 04/28/23 0419 04/30/23 1000  NA 139 136 134* 137 135  K 4.5 4.3 4.4 4.2 3.8  CL 102 97* 97* 98 98  CO2 27 28 28 29 24   GLUCOSE 134* 113* 167* 113* 150*  BUN 47* 31* 43* 22 51*  CREATININE 8.49* 6.29* 8.10* 5.51* 10.47*  CALCIUM 8.7* 8.8* 8.4* 8.6* 8.7*  PHOS 3.8  --   --   --  4.3    CBC: Recent Labs  Lab 04/25/23 0659 04/26/23 0903 04/27/23 0416 04/28/23 0419 04/30/23 1000  WBC 10.3 9.6 11.2* 10.5 11.3*  HGB 7.4* 7.9* 7.4* 7.3* 7.6*  HCT 22.6* 23.2* 22.2* 22.2* 23.0*  MCV 91.9 89.6 91.4 91.7 92.0  PLT 198 216 211 186 245    LFT Recent Labs  Lab 04/25/23 0659 04/30/23 1000  ALBUMIN 2.9* 2.9*     Antibiotics: Anti-infectives (From admission, onward)    Start     Dose/Rate Route Frequency Ordered Stop   04/23/23 2200  ceFEPIme (MAXIPIME) 1 g in sodium chloride 0.9 % 100 mL IVPB        1 g 200 mL/hr over 30 Minutes Intravenous Every 24 hours 04/23/23 2123 04/30/23 0006   04/23/23 2015  ceFEPIme (MAXIPIME) 2 g in sodium chloride 0.9 % 100 mL IVPB  Status:  Discontinued        2 g 200 mL/hr over 30 Minutes Intravenous  Once 04/23/23 2012 04/23/23 2123   04/23/23 2015  azithromycin (ZITHROMAX) 500 mg in sodium chloride 0.9 % 250 mL IVPB        500 mg 250 mL/hr over 60 Minutes Intravenous  Once 04/23/23 2012 04/24/23 0327        DVT prophylaxis: Apixaban  Code Status: Full code  Family Communication: No family at bedside   CONSULTS nephrology   Subjective   Patient seen and examined, still continues to require 10 L pulmonary of oxygen.  Having 2-3 loose BMs every day.  Objective    Physical Examination:  General-appears in no acute distress Heart-S1-S2, regular, no murmur auscultated Lungs-clear to  auscultation bilaterally, no wheezing or crackles auscultated Abdomen-soft, nontender, no organomegaly Extremities-no edema in the lower extremities Neuro-alert, oriented x3, no focal deficit noted  Status is: Inpatient:             [name]   [facility] If 7PM-7AM, please contact night-coverage at [email or web address], Office  [phone]   05/01/2023, 8:06 AM  LOS: 7 days

## 2023-05-01 NOTE — Progress Notes (Signed)
PULMONOLOGY         Date: 05/01/2023,   MRN# [id number] Deborah [name] 1956/05/28     AdmissionWeight:  (Unable to weigh , Pt on stretcher, too short of breath to stand)                 CurrentWeight: 64.1 kg  Referring provider: Dr Sharl Ma   CHIEF COMPLAINT:   Acute on chronic hypoxemic respiratory failure   HISTORY OF PRESENT ILLNESS   This is a pleasant 67 year old female with a history of cardiac dysfunction including diastolic heart failure, chronic atrial fibrillation with atrial flutter, cardiogenic shock, metabolic syndrome with diabetes, dyslipidemia and ESRD with chronic hypertension and is currently receiving dialysis 3 times weekly Monday Wednesday Friday, does have chronic respiratory insufficiency with episodes of hypoxemia tracheostomy status currently requiring 10 L supplemental O2, this particular hospitalization complicated by increased O2 requirement with difficulty weaning.  PCCM consultation for further evaluation management.  05/01/23-  patient in bed on 10L/min trache collar, in no distress this am. CXR with pulm edema and pleural effusion worse on left.    PAST MEDICAL HISTORY   Past Medical History:  Diagnosis Date   Acute on chronic diastolic CHF (congestive heart failure) ([name]) 01/04/2022   Atrial fibrillation ([name])    Atrial flutter ([name])    Cardiogenic shock ([name]) 08/17/2016   Diabetes mellitus    Diabetes mellitus type 2, controlled ([name]) 03/08/2012   ESRD (end stage renal disease) ([name])    Heart attack ([name])    Hyperlipidemia    Hypertension    MI, old    NSTEMI (non-ST elevated myocardial infarction) ([name])    Renal disorder    dialysls   Respiratory failure ([name]) 08/17/2016   ST elevation myocardial infarction involving left main coronary artery ([name])    STEMI (ST elevation myocardial infarction) ([name]) 08/15/2016   Type 2 diabetes mellitus with complication, with long-term current use of insulin [facility])      SURGICAL HISTORY    Past Surgical History:  Procedure Laterality Date   A/V FISTULAGRAM N/A 10/05/2017   Procedure: A/V Fistulagram;  Surgeon: Renford Dills, MD;  Location: [facility] INVASIVE CV LAB;  Service: Cardiovascular;  Laterality: N/A;   A/V SHUNTOGRAM N/A 10/05/2017   Procedure: A/V SHUNTOGRAM;  Surgeon: Renford Dills, MD;  Location: [facility] INVASIVE CV LAB;  Service: Cardiovascular;  Laterality: N/A;   AV FISTULA PLACEMENT Left 10/02/2016   Procedure: INSERTION OF ARTERIOVENOUS (AV) GORE-TEX GRAFT ARM;  Surgeon: Maeola Harman, MD;  Location: [facility] OR;  Service: Vascular;  Laterality: Left;   CARDIAC CATHETERIZATION N/A 08/15/2016   Procedure: Left Heart Cath and Coronary Angiography;  Surgeon: Alwyn Pea, MD;  Location: [facility] INVASIVE CV LAB;  Service: Cardiovascular;  Laterality: N/A;   CARDIAC CATHETERIZATION N/A 08/15/2016   Procedure: Coronary Stent Intervention;  Surgeon: Alwyn Pea, MD;  Location: [facility] INVASIVE CV LAB;  Service: Cardiovascular;  Laterality: N/A;   CARDIAC CATHETERIZATION N/A 08/18/2016   Procedure: Right Heart Cath;  Surgeon: Dolores Patty, MD;  Location: [facility] INVASIVE CV LAB;  Service: Cardiovascular;  Laterality: N/A;   CARDIAC CATHETERIZATION N/A 08/18/2016   Procedure: IABP Insertion;  Surgeon: Dolores Patty, MD;  Location: [facility] INVASIVE CV LAB;  Service: Cardiovascular;  Laterality: N/A;   CARDIAC CATHETERIZATION Right 08/23/2016   Procedure: CENTRAL LINE INSERTION RIGHT SUBCLAVIAN;  Surgeon: Kerin Perna, MD;  Location: [facility] OR;  Service: Open Heart Surgery;  Laterality: Right;  ENDOVEIN HARVEST OF GREATER SAPHENOUS VEIN Left 08/23/2016   Procedure: ENDOVEIN HARVEST OF GREATER SAPHENOUS VEIN;  Surgeon: Kerin Perna, MD;  Location: [facility] OR;  Service: Open Heart Surgery;  Laterality: Left;   INSERTION OF DIALYSIS CATHETER Bilateral 08/31/2016   Procedure: INSERTION OF DIALYSIS CATHETER LEFT INTERNAL JUGULAR VEIN & INSERTION OF TRIPLE LUMEN RIGHT  INTERNAL JUGULAR VEIN;  Surgeon: Chuck Hint, MD;  Location: [facility] OR;  Service: Vascular;  Laterality: Bilateral;   INTRAOPERATIVE TRANSESOPHAGEAL ECHOCARDIOGRAM N/A 08/23/2016   Procedure: INTRAOPERATIVE TRANSESOPHAGEAL ECHOCARDIOGRAM;  Surgeon: Kerin Perna, MD;  Location: [facility] OR;  Service: Open Heart Surgery;  Laterality: N/A;   IR GENERIC HISTORICAL  11/13/2016   IR US GUIDE VASC ACCESS LEFT 11/13/2016 Gilmer Mor, DO [facility]   IR GENERIC HISTORICAL  11/13/2016   IR FLUORO GUIDE CV LINE LEFT 11/13/2016 Gilmer Mor, DO [facility]   IR GENERIC HISTORICAL  11/13/2016   IR GASTROSTOMY TUBE MOD SED 11/13/2016 Gilmer Mor, DO [facility]   MITRAL VALVE REPAIR N/A 08/23/2016   Procedure: MITRAL VALVE REPAIR (MVR) USING EDWARDS MAGNA EASE BIOPROSTHESIS MITRAL  VALVE;  Surgeon: Kerin Perna, MD;  Location: [facility] OR;  Service: Open Heart Surgery;  Laterality: N/A;   tracheostomy reversal     TRACHEOSTOMY TUBE PLACEMENT N/A 11/09/2016   Procedure: TRACHEOSTOMY;  Surgeon: Suzanna Obey, MD;  Location[facility] OR;  Service: ENT;  Laterality: N/A;   VAGINAL DELIVERY     x 6     FAMILY HISTORY   Family History  Problem Relation Age of Onset   Hypertension Mother    Diabetes Mother    Breast cancer Sister    Hypertension Father      SOCIAL HISTORY   Social History   Tobacco Use   Smoking status: Never   Smokeless tobacco: Never  Substance Use Topics   Alcohol use: No   Drug use: No     MEDICATIONS    Home Medication:    Current Medication:  Current Facility-Administered Medications:    0.9 %  sodium chloride infusion, , Intravenous, PRN, [name], MD, Last Rate: 5 mL/hr at 04/27/23 2257, New Bag at 04/27/23 2257   acetaminophen (TYLENOL) tablet 650 mg, 650 mg, Oral, Q6H PRN, 650 mg at 04/25/23 1337 **OR** acetaminophen (TYLENOL) suppository 650 mg, 650 mg, Rectal, Q6H PRN, Sharl Ma, Sarina Ill, MD   amLODipine (NORVASC) tablet 10 mg, 10 mg, Oral, Daily, Sharl Ma,  Gagan S, MD, 10 mg at 05/01/23 0854   apixaban (ELIQUIS) tablet 2.5 mg, 2.5 mg, Oral, BID, Sharl Ma, Gagan S, MD, 2.5 mg at 05/01/23 0854   aspirin EC tablet 81 mg, 81 mg, Oral, Daily, Sharl Ma, Gagan S, MD, 81 mg at 05/01/23 0854   atorvastatin (LIPITOR) tablet 80 mg, 80 mg, Oral, q1800, [name], MD, 80 mg at 04/30/23 2130   carvedilol (COREG) tablet 25 mg, 25 mg, Oral, BID, Sharl Ma, Gagan S, MD, 25 mg at 05/01/23 0854   Chlorhexidine Gluconate Cloth 2 % PADS 6 each, 6 each, Topical, Q0600, [name], MD, 6 each at 05/01/23 0539   epoetin alfa (EPOGEN) injection 10,000 Units, 10,000 Units, Intravenous, Q M,W,F-HD, Sharl Ma, Sarina Ill, MD, 10,000 Units at 04/30/23 1132   escitalopram (LEXAPRO) tablet 10 mg, 10 mg, Oral, Daily, Sharl Ma, Sarina Ill, MD, 10 mg at 05/01/23 0854   gabapentin (NEURONTIN) capsule 300 mg, 300 mg, Oral, QHS, [name], MD, 300 mg at 04/30/23 2129   guaiFENesin-dextromethorphan (ROBITUSSIN  DM) 100-10 MG/5ML syrup 15 mL, 15 mL, Oral, Q4H PRN, Sharl Ma, Sarina Ill, MD, 15 mL at 04/28/23 0024   insulin aspart (novoLOG) injection 0-5 Units, 0-5 Units, Subcutaneous, QHS, Sharl Ma, Gagan S, MD, 4 Units at 04/28/23 2221   insulin aspart (novoLOG) injection 0-6 Units, 0-6 Units, Subcutaneous, TID WC, Sharl Ma, Sarina Ill, MD, 3 Units at 04/29/23 1721   insulin detemir (LEVEMIR) injection 4 Units, 4 Units, Subcutaneous, BID, Sharl Ma, Sarina Ill, MD, 4 Units at 04/30/23 2129   ipratropium-albuterol (DUONEB) 0.5-2.5 (3) MG/3ML nebulizer solution 3 mL, 3 mL, Nebulization, TID, Sharl Ma, Sarina Ill, MD   irbesartan (AVAPRO) tablet 150 mg, 150 mg, Oral, Daily, Sharl Ma, Gagan S, MD, 150 mg at 05/01/23 0854   loperamide (IMODIUM) capsule 2 mg, 2 mg, Oral, PRN, [name], MD, 2 mg at 05/01/23 0272   midodrine (PROAMATINE) tablet 5 mg, 5 mg, Oral, Once per day on Mon Wed Fri, Lama, Gagan S, MD   multivitamin (RENA-VIT) tablet 1 tablet, 1 tablet, Oral, QHS, Sharl Ma, Sarina Ill, MD, 1 tablet at 04/30/23 2129   ondansetron (ZOFRAN) tablet  4 mg, 4 mg, Oral, Q6H PRN, 4 mg at 04/23/23 2346 **OR** ondansetron (ZOFRAN) injection 4 mg, 4 mg, Intravenous, Q6H PRN, Sharl Ma, Sarina Ill, MD, 4 mg at 04/27/23 0043   pantoprazole (PROTONIX) EC tablet 40 mg, 40 mg, Oral, Daily, Sharl Ma, Gagan S, MD, 40 mg at 05/01/23 0854   sevelamer carbonate (RENVELA) tablet 1,600 mg, 1,600 mg, Oral, TID WC, Sharl Ma, Gagan S, MD, 1,600 mg at 05/01/23 0854   traZODone (DESYREL) tablet 50 mg, 50 mg, Oral, Once per day on Mon Wed Fri, Lama, Gagan S, MD, 50 mg at 04/30/23 2129    ALLERGIES   Patient has no known allergies.     REVIEW OF SYSTEMS    Review of Systems:  Gen:  Denies  fever, sweats, chills weigh loss  HEENT: Denies blurred vision, double vision, ear pain, eye pain, hearing loss, nose bleeds, sore throat Cardiac:  No dizziness, chest pain or heaviness, chest tightness,edema Resp:   reports dyspnea chronically  Gi: Denies swallowing difficulty, stomach pain, nausea or vomiting, diarrhea, constipation, bowel incontinence Gu:  Denies bladder incontinence, burning urine Ext:   Denies Joint pain, stiffness or swelling Skin: Denies  skin rash, easy bruising or bleeding or hives Endoc:  Denies polyuria, polydipsia , polyphagia or weight change Psych:   Denies depression, insomnia or hallucinations   Other:  All other systems negative   VS: BP 126/62 (BP Location: Right Arm)   Pulse 71   Temp 97.6 F (36.4 C) (Oral)   Resp 19   Ht 5\' 3"  (1.6 m)   Wt 64.1 kg   SpO2 98%   BMI 25.03 kg/m      PHYSICAL EXAM    GENERAL:NAD, no fevers, chills, no weakness no fatigue HEAD: Normocephalic, atraumatic.  EYES: Pupils equal, round, reactive to light. Extraocular muscles intact. No scleral icterus.  MOUTH: Moist mucosal membrane. Dentition intact. No abscess noted.  EAR, NOSE, THROAT: Clear without exudates. No external lesions.  NECK: Supple. No thyromegaly. No nodules. No JVD.  PULMONARY: decreased breath sounds with mild rhonchi worse at bases  bilaterally.  CARDIOVASCULAR: S1 and S2. Regular rate and rhythm. No murmurs, rubs, or gallops. No edema. Pedal pulses 2+ bilaterally.  GASTROINTESTINAL: Soft, nontender, nondistended. No masses. Positive bowel sounds. No hepatosplenomegaly.  MUSCULOSKELETAL: No swelling, clubbing, or edema. Range of motion full in all extremities.  NEUROLOGIC: Cranial nerves II through  XII are intact. No gross focal neurological deficits. Sensation intact. Reflexes intact.  SKIN: No ulceration, lesions, rashes, or cyanosis. Skin warm and dry. Turgor intact.  PSYCHIATRIC: Mood, affect within normal limits. The patient is awake, alert and oriented x 3. Insight, judgment intact.       IMAGING   Narrative & Impression  CLINICAL DATA:  Atelectasis.   EXAM: PORTABLE CHEST 1 VIEW   COMPARISON:  Radiograph and CT 04/23/2023   FINDINGS: Tracheostomy tube tip at the thoracic inlet. Median sternotomy and prosthetic cardiac valve. Stable cardiomegaly. Improvement in right pleural effusion. Similar left pleural effusion and basilar opacity. Pulmonary edema is unchanged. No pneumothorax.   IMPRESSION: 1. Improvement in right pleural effusion. 2. Unchanged left pleural effusion and basilar opacity. 3. Unchanged pulmonary edema.       -pulm edema and bilateral effusions worse on left - CXR 04/29/23 - independent review     ASSESSMENT/PLAN     Acute on chronic hypoxemic respiratory failure Status post chest x-ray-interval changes with improved right lower lobe however left hemithorax with interstitial edema and atelectasis. -CRP is within reference range suggestive of absence of inflammatory changes in low likelihood of infectious or inflammatory pneumonia, there is a very mild leukocytosis 11.3 which may be due to underlying GI cholera. -have reduced bb to once daily with coreg and ordered lasix IV 80mg  for challenge today   Bibasilar atelectasis -Due to compressive effects with surrounding pleural  effusion as well as chronic deconditioning and interstitial edema with development of atelectatic segments.  Patient should start chronic physical therapy and Occupational Therapy with pulmonary rehab on outpatient basis, in the interim recruitment maneuvers with MetaNeb device utilizing albuterol as well as utilization of chest physiotherapy with incentive spirometry each hour.  continue inpatient physical and occupational therapy Patient is also on DuoNeb nebulizer therapy which should also help treat atelectasis.   Interstitial edema -Nephrology is on case patient is currently being dialyzed -Complex fluid management due to ESRD -lasix 80mg  IV x daily     Thank you for allowing me to participate in the care of this patient.   Patient/Family are satisfied with care plan and all questions have been answered.    Provider disclosure: Patient with at least one acute or chronic illness or injury that poses a threat to life or bodily function and is being managed actively during this encounter.  All of the below services have been performed independently by signing provider:  review of prior documentation from internal and or external health records.  Review of previous and current lab results.  Interview and comprehensive assessment during patient visit today. Review of current and previous chest radiographs/CT scans. Discussion of management and test interpretation with health care team and patient/family.   This document was prepared using Dragon voice recognition software and may include unintentional dictation errors.     Vida Rigger, M.D.  Division of Pulmonary & Critical Care Medicine

## 2023-05-01 NOTE — Progress Notes (Signed)
[facility]  ROUNDING NOTE   Subjective:   [name] returns to [facility] on 04/23/2023 for Acute on chronic respiratory failure with hypoxia ([name]) [J96.21] Acute on chronic diastolic (congestive) heart failure ([name]) [I50.33]  Patient is known to our practice from previous admissions and receives outpatient dialysis at [facility] on a MWF schedule, supervised by [facility] physicians.  Patient laying in bed States she feels weak today Tolerating small meals  Remains on 10L trach collar Denies shortness of breath   Objective:  Vital signs in last 24 hours:  Temp:  [97.6 F (36.4 C)-98.9 F (37.2 C)] 97.6 F (36.4 C) (05/28 0426) Pulse Rate:  [71-88] 71 (05/28 0426) Resp:  [17-20] 19 (05/28 0426) BP: (113-130)/(56-64) 126/62 (05/28 0817) SpO2:  [90 %-100 %] 100 % (05/28 1300) FiO2 (%):  [40 %-50 %] 40 % (05/28 1300) Weight:  [64.1 kg] 64.1 kg (05/28 0500)  Weight change:  Filed Weights   04/30/23 0942 04/30/23 1337 05/01/23 0500  Weight: 64.5 kg 63 kg 64.1 kg    Intake/Output: I/O last 3 completed shifts: In: 681.2 [P.O.:538; IV Piggyback:143.2] Out: 1500 [Other:1500]   Intake/Output this shift:  Total I/O In: 300 [P.O.:300] Out: -   Physical Exam: General: NAD  Head: Normocephalic, atraumatic. Moist oral mucosal membranes  Eyes: Anicteric  Neck: +tracheostomy  Lungs:  Diminished in bases  O2 Trach  Heart: Regular rate and rhythm  Abdomen:  Soft, nontender  Extremities: No peripheral edema.  Neurologic: Nonfocal, moving all four extremities  Skin: No lesions  Access: Left AVG    Basic Metabolic Panel: Recent Labs  Lab 04/25/23 0659 04/26/23 0903 04/27/23 0416 04/28/23 0419 04/30/23 1000  NA 139 136 134* 137 135  K 4.5 4.3 4.4 4.2 3.8  CL 102 97* 97* 98 98  CO2 27 28 28 29 24   GLUCOSE 134* 113* 167* 113* 150*  BUN 47* 31* 43* 22 51*  CREATININE 8.49* 6.29* 8.10* 5.51* 10.47*  CALCIUM 8.7* 8.8* 8.4* 8.6* 8.7*  PHOS 3.8  --    --   --  4.3     Liver Function Tests: Recent Labs  Lab 04/25/23 0659 04/30/23 1000  ALBUMIN 2.9* 2.9*    No results for input(s): "LIPASE", "AMYLASE" in the last 168 hours. No results for input(s): "AMMONIA" in the last 168 hours.  CBC: Recent Labs  Lab 04/25/23 0659 04/26/23 0903 04/27/23 0416 04/28/23 0419 04/30/23 1000  WBC 10.3 9.6 11.2* 10.5 11.3*  HGB 7.4* 7.9* 7.4* 7.3* 7.6*  HCT 22.6* 23.2* 22.2* 22.2* 23.0*  MCV 91.9 89.6 91.4 91.7 92.0  PLT 198 216 211 186 245     Cardiac Enzymes: No results for input(s): "CKTOTAL", "CKMB", "CKMBINDEX", "TROPONINI" in the last 168 hours.  BNP: Invalid input(s): "POCBNP"  CBG: Recent Labs  Lab 04/30/23 0746 04/30/23 1532 04/30/23 2052 05/01/23 0750 05/01/23 1439  GLUCAP 119* 117* 109* 76 196*     Microbiology: Results for orders placed or performed during the hospital encounter of 04/23/23  SARS Coronavirus 2 by RT PCR (hospital order, performed in [facility] hospital lab) *cepheid single result test* Anterior Nasal Swab     Status: None   Collection Time: 04/23/23  9:30 AM   Specimen: Anterior Nasal Swab  Result Value Ref Range Status   SARS Coronavirus 2 by RT PCR NEGATIVE NEGATIVE Final    Comment: (NOTE) SARS-CoV-2 target nucleic acids are NOT DETECTED.  The SARS-CoV-2 RNA is generally detectable in upper and lower  respiratory specimens during the acute phase of infection. The lowest concentration of SARS-CoV-2 viral copies this assay can detect is 250 copies / mL. A negative result does not preclude SARS-CoV-2 infection and should not be used as the sole basis for treatment or other patient management decisions.  A negative result may occur with improper specimen collection / handling, submission of specimen other than nasopharyngeal swab, presence of viral mutation(s) within the areas targeted by this assay, and inadequate number of viral copies (<250 copies / mL). A negative result must be combined  with clinical observations, patient history, and epidemiological information.  Fact Sheet for Patients:   RoadLapTop.co.za  Fact Sheet for Healthcare Providers: http://kim-miller.com/  This test is not yet approved or  cleared by the [place] FDA and has been authorized for detection and/or diagnosis of SARS-CoV-2 by FDA under an Emergency Use Authorization (EUA).  This EUA will remain in effect (meaning this test can be used) for the duration of the COVID-19 declaration under Section 564(b)(1) of the Act, 21 U.S.C. section 360bbb-3(b)(1), unless the authorization is terminated or revoked sooner.  Performed at [facility], [address] [place], [place] [address]   Culture, blood (Routine X 2) w Reflex to ID Panel     Status: None   Collection Time: 04/24/23  7:52 PM   Specimen: BLOOD  Result Value Ref Range Status   Specimen Description BLOOD RIGHT ANTECUBITAL  Final   Special Requests   Final    BOTTLES DRAWN AEROBIC AND ANAEROBIC Blood Culture adequate volume   Culture   Final    NO GROWTH 5 DAYS Performed at [facility], [address] [place], [place] [address]    Report Status 04/29/2023 FINAL  Final  Culture, blood (Routine X 2) w Reflex to ID Panel     Status: None   Collection Time: 04/24/23  7:56 PM   Specimen: BLOOD  Result Value Ref Range Status   Specimen Description BLOOD RIGHT ANTECUBITAL  Final   Special Requests   Final    BOTTLES DRAWN AEROBIC AND ANAEROBIC Blood Culture adequate volume   Culture   Final    NO GROWTH 5 DAYS Performed at [facility], [address] [place], [place] [address]    Report Status 04/29/2023 FINAL  Final  Respiratory (~20 pathogens) panel by PCR     Status: None   Collection Time: 04/25/23  2:01 PM   Specimen: Nasopharyngeal Swab; Respiratory  Result Value Ref Range Status   Adenovirus NOT DETECTED NOT DETECTED Final   Coronavirus 229E NOT  DETECTED NOT DETECTED Final    Comment: (NOTE) The Coronavirus on the Respiratory Panel, DOES NOT test for the novel  Coronavirus (2019 nCoV)    Coronavirus HKU1 NOT DETECTED NOT DETECTED Final   Coronavirus NL63 NOT DETECTED NOT DETECTED Final   Coronavirus OC43 NOT DETECTED NOT DETECTED Final   Metapneumovirus NOT DETECTED NOT DETECTED Final   Rhinovirus / Enterovirus NOT DETECTED NOT DETECTED Final   Influenza A NOT DETECTED NOT DETECTED Final   Influenza B NOT DETECTED NOT DETECTED Final   Parainfluenza Virus 1 NOT DETECTED NOT DETECTED Final   Parainfluenza Virus 2 NOT DETECTED NOT DETECTED Final   Parainfluenza Virus 3 NOT DETECTED NOT DETECTED Final   Parainfluenza Virus 4 NOT DETECTED NOT DETECTED Final   Respiratory Syncytial Virus NOT DETECTED NOT DETECTED Final   Bordetella pertussis NOT DETECTED NOT DETECTED Final   Bordetella Parapertussis NOT DETECTED NOT DETECTED  Final   Chlamydophila pneumoniae NOT DETECTED NOT DETECTED Final   Mycoplasma pneumoniae NOT DETECTED NOT DETECTED Final    Comment: Performed at [facility], [address], [place], [place] [address]  Gastrointestinal Panel by PCR , Stool     Status: Abnormal   Collection Time: 04/26/23  6:56 AM   Specimen: Stool  Result Value Ref Range Status   Campylobacter species NOT DETECTED NOT DETECTED Final   Plesimonas shigelloides NOT DETECTED NOT DETECTED Final   Salmonella species NOT DETECTED NOT DETECTED Final   Yersinia enterocolitica NOT DETECTED NOT DETECTED Final   Vibrio species DETECTED (A) NOT DETECTED Final    Comment: RESULT CALLED TO, READ BACK BY AND VERIFIED WITH: JOSEPH HAYES AT 6045 04/29/23.PMF    Vibrio cholerae DETECTED (A) NOT DETECTED Final    Comment: RESULT CALLED TO, READ BACK BY AND VERIFIED WITH: JOSEPH HAYES AT 4098 04/29/23.PMF    Enteroaggregative E coli (EAEC) NOT DETECTED NOT DETECTED Final   Enteropathogenic E coli (EPEC) NOT DETECTED NOT DETECTED Final    Enterotoxigenic E coli (ETEC) NOT DETECTED NOT DETECTED Final   Shiga like toxin producing E coli (STEC) NOT DETECTED NOT DETECTED Final   Shigella/Enteroinvasive E coli (EIEC) NOT DETECTED NOT DETECTED Final   Cryptosporidium NOT DETECTED NOT DETECTED Final   Cyclospora cayetanensis NOT DETECTED NOT DETECTED Final   Entamoeba histolytica NOT DETECTED NOT DETECTED Final   Giardia lamblia NOT DETECTED NOT DETECTED Final   Adenovirus F40/41 NOT DETECTED NOT DETECTED Final   Astrovirus NOT DETECTED NOT DETECTED Final   Norovirus GI/GII NOT DETECTED NOT DETECTED Final   Rotavirus A NOT DETECTED NOT DETECTED Final   Sapovirus (I, II, IV, and V) NOT DETECTED NOT DETECTED Final    Comment: Performed at [facility], [address] [place], [place] [address]    Coagulation Studies: No results for input(s): "LABPROT", "INR" in the last 72 hours.   Urinalysis: No results for input(s): "COLORURINE", "LABSPEC", "PHURINE", "GLUCOSEU", "HGBUR", "BILIRUBINUR", "KETONESUR", "PROTEINUR", "UROBILINOGEN", "NITRITE", "LEUKOCYTESUR" in the last 72 hours.  Invalid input(s): "APPERANCEUR"    Imaging: No results found.   Medications:    sodium chloride 5 mL/hr at 04/27/23 2257     amLODipine  10 mg Oral Daily   apixaban  2.5 mg Oral BID   aspirin EC  81 mg Oral Daily   atorvastatin  80 mg Oral q1800   [START ON 05/02/2023] carvedilol  25 mg Oral Daily   Chlorhexidine Gluconate Cloth  6 each Topical Q0600   epoetin (EPOGEN/PROCRIT) injection  10,000 Units Intravenous Q M,W,F-HD   escitalopram  10 mg Oral Daily   furosemide  80 mg Intravenous Daily   gabapentin  300 mg Oral QHS   insulin aspart  0-5 Units Subcutaneous QHS   insulin aspart  0-6 Units Subcutaneous TID WC   insulin detemir  4 Units Subcutaneous BID   ipratropium-albuterol  3 mL Nebulization TID   irbesartan  150 mg Oral Daily   midodrine  5 mg Oral Once per day on Mon Wed Fri   multivitamin  1 tablet Oral QHS    pantoprazole  40 mg Oral Daily   sevelamer carbonate  1,600 mg Oral TID WC   traZODone  50 mg Oral Once per day on Mon Wed Fri   sodium chloride, acetaminophen **OR** acetaminophen, guaiFENesin-dextromethorphan, loperamide, ondansetron **OR** ondansetron (ZOFRAN) IV  Assessment/ Plan:  [name] is a 67 y.o.  female with end  stage renal disease on hemodialysis, tracheostomy, hypertension, hyperlipidemia, diabetes mellitus type II insulin dependent, depression, and diabetic neuropathy who presents to [facility] for 04/23/2023 for Acute on chronic respiratory failure with hypoxia ([name]) [J96.21] Acute on chronic diastolic (congestive) heart failure ([name]) [I50.33]  [facility] Nephrology MWF [address] Left AVG 62kg  End Stage Kidney Disease:  -  UF 1.5L achieved with dialysis yesterday. Next treatment scheduled for Wednesday.  - Patient will need to be weaned to 6L or less for outpatient clinic acceptance.   Hypertension with chronic kidney disease: current regimen of irbesartan, carvedilol and amlodipine. Home regimen of olmesartan instead of irbesartan. Also prescribed Midodrine to prevent hypotension during dialysis.   -Blood pressure acceptable for this patient.   Diabetes mellitus type II with chronic kidney disease: insulin dependent.   Primary team to manage sliding scale insulin  Secondary Hyperparathyroidism - Calcium and phosphorus within desired range.  - Continue sevelamer with meals  Anemia of chronic kidney disease: - Hgb below desired range.  -Continue EPO with dialysis   LOS: 7   5/28/20242:54 PM

## 2023-05-02 ENCOUNTER — Inpatient Hospital Stay: Payer: 59

## 2023-05-02 DIAGNOSIS — J9621 Acute and chronic respiratory failure with hypoxia: Secondary | ICD-10-CM | POA: Diagnosis not present

## 2023-05-02 LAB — RENAL FUNCTION PANEL
Albumin: 3 g/dL — ABNORMAL LOW (ref 3.5–5.0)
Anion gap: 11 (ref 5–15)
BUN: 45 mg/dL — ABNORMAL HIGH (ref 8–23)
CO2: 29 mmol/L (ref 22–32)
Calcium: 9 mg/dL (ref 8.9–10.3)
Chloride: 98 mmol/L (ref 98–111)
Creatinine, Ser: 8.79 mg/dL — ABNORMAL HIGH (ref 0.44–1.00)
GFR, Estimated: 5 mL/min — ABNORMAL LOW (ref >60)
Glucose, Bld: 153 mg/dL — ABNORMAL HIGH (ref 70–99)
Phosphorus: 4.6 mg/dL (ref 2.5–4.6)
Potassium: 4.3 mmol/L (ref 3.5–5.1)
Sodium: 138 mmol/L (ref 135–145)

## 2023-05-02 LAB — ORGANISM ID, BACTERIA

## 2023-05-02 LAB — BACTERIAL ORGANISM REFLEX

## 2023-05-02 LAB — GLUCOSE, CAPILLARY
Glucose-Capillary: 96 mg/dL (ref 70–99)
Glucose-Capillary: 94 mg/dL (ref 70–99)
Glucose-Capillary: 216 mg/dL — ABNORMAL HIGH (ref 70–99)

## 2023-05-02 MED ORDER — EPOETIN ALFA 10000 UNIT/ML IJ SOLN
INTRAMUSCULAR | Status: AC
  Filled 2023-05-02: qty 1

## 2023-05-02 NOTE — Progress Notes (Signed)
[facility]  ROUNDING NOTE   Subjective:   [name] returns to [facility] on 04/23/2023 for Acute on chronic respiratory failure with hypoxia ([name]) [J96.21] Acute on chronic diastolic (congestive) heart failure ([name]) [I50.33]  Patient is known to our practice from previous admissions and receives outpatient dialysis at [facility] on a MWF schedule, supervised by [facility] physicians.  Patient seen and evaluated during dialysis   HEMODIALYSIS FLOWSHEET:  Blood Flow Rate (mL/min): 400 mL/min Arterial Pressure (mmHg): -110 mmHg Venous Pressure (mmHg): 230 mmHg TMP (mmHg): 11 mmHg Ultrafiltration Rate (mL/min): 623 mL/min Dialysate Flow Rate (mL/min): 300 ml/min Dialysis Fluid Bolus: Normal Saline  Complains of increased fatigue today Remains on 10 L trach collar.  Objective:  Vital signs in last 24 hours:  Temp:  [97.1 F (36.2 C)-98.4 F (36.9 C)] 97.1 F (36.2 C) (05/29 1042) Pulse Rate:  [63-75] 75 (05/29 1345) Resp:  [12-24] 12 (05/29 1345) BP: (100-125)/(52-65) 117/63 (05/29 1345) SpO2:  [83 %-96 %] 96 % (05/29 1345) FiO2 (%):  [40 %] 40 % (05/29 1345) Weight:  [64.2 kg] 64.2 kg (05/29 1050)  Weight change:  Filed Weights   04/30/23 1337 05/01/23 0500 05/02/23 1050  Weight: 63 kg 64.1 kg 64.2 kg    Intake/Output: I/O last 3 completed shifts: In: 660 [P.O.:420; I.V.:240] Out: 251 [Urine:250; Stool:1]   Intake/Output this shift:  No intake/output data recorded.  Physical Exam: General: NAD  Head: Normocephalic, atraumatic. Moist oral mucosal membranes  Eyes: Anicteric  Neck: +tracheostomy  Lungs:  Diminished in bases  O2 Trach  Heart: Regular rate and rhythm  Abdomen:  Soft, nontender  Extremities: No peripheral edema.  Neurologic: Nonfocal, moving all four extremities  Skin: No lesions  Access: Left AVG    Basic Metabolic Panel: Recent Labs  Lab 04/26/23 0903 04/27/23 0416 04/28/23 0419 04/30/23 1000 05/02/23 0934  NA 136  134* 137 135 138  K 4.3 4.4 4.2 3.8 4.3  CL 97* 97* 98 98 98  CO2 28 28 29 24 29   GLUCOSE 113* 167* 113* 150* 153*  BUN 31* 43* 22 51* 45*  CREATININE 6.29* 8.10* 5.51* 10.47* 8.79*  CALCIUM 8.8* 8.4* 8.6* 8.7* 9.0  PHOS  --   --   --  4.3 4.6     Liver Function Tests: Recent Labs  Lab 04/30/23 1000 05/02/23 0934  ALBUMIN 2.9* 3.0*    No results for input(s): "LIPASE", "AMYLASE" in the last 168 hours. No results for input(s): "AMMONIA" in the last 168 hours.  CBC: Recent Labs  Lab 04/26/23 0903 04/27/23 0416 04/28/23 0419 04/30/23 1000  WBC 9.6 11.2* 10.5 11.3*  HGB 7.9* 7.4* 7.3* 7.6*  HCT 23.2* 22.2* 22.2* 23.0*  MCV 89.6 91.4 91.7 92.0  PLT 216 211 186 245     Cardiac Enzymes: No results for input(s): "CKTOTAL", "CKMB", "CKMBINDEX", "TROPONINI" in the last 168 hours.  BNP: Invalid input(s): "POCBNP"  CBG: Recent Labs  Lab 05/01/23 0750 05/01/23 1439 05/01/23 1804 05/01/23 2133 05/02/23 0752  GLUCAP 76 196* 202* 212* 96     Microbiology: Results for orders placed or performed during the hospital encounter of 04/23/23  Organism ID, Bacteria     Status: None (Preliminary result)   Collection Time: 04/18/23  8:20 PM  Result Value Ref Range Status   Organism ID, Bacteria REFERT  Final    Comment: (NOTE) Please refer to the following specimen for additional lab results. Please refer to spec # [id number] for test result.  Camelia Moraru notified 05-02-2023 at 13:00 Performed At: [facility] [address] [place], [place] [id number] Jolene Schimke MD WU:[id number]    Source of Sample PENDING  Incomplete  Bacterial organism reflex     Status: None   Collection Time: 04/18/23  8:20 PM  Result Value Ref Range Status   Bacterial result 1 Comment  Final    Comment: (NOTE) Specimen has been received and testing has been initiated. Performed At: [facility] [address] [place], [place] [id number] Jolene Schimke MD  VH:[id number]   SARS Coronavirus 2 by RT PCR (hospital order, performed in [facility] hospital lab) *cepheid single result test* Anterior Nasal Swab     Status: None   Collection Time: 04/23/23  9:30 AM   Specimen: Anterior Nasal Swab  Result Value Ref Range Status   SARS Coronavirus 2 by RT PCR NEGATIVE NEGATIVE Final    Comment: (NOTE) SARS-CoV-2 target nucleic acids are NOT DETECTED.  The SARS-CoV-2 RNA is generally detectable in upper and lower respiratory specimens during the acute phase of infection. The lowest concentration of SARS-CoV-2 viral copies this assay can detect is 250 copies / mL. A negative result does not preclude SARS-CoV-2 infection and should not be used as the sole basis for treatment or other patient management decisions.  A negative result may occur with improper specimen collection / handling, submission of specimen other than nasopharyngeal swab, presence of viral mutation(s) within the areas targeted by this assay, and inadequate number of viral copies (<250 copies / mL). A negative result must be combined with clinical observations, patient history, and epidemiological information.  Fact Sheet for Patients:   RoadLapTop.co.za  Fact Sheet for Healthcare Providers: http://kim-miller.com/  This test is not yet approved or  cleared by the [place] FDA and has been authorized for detection and/or diagnosis of SARS-CoV-2 by FDA under an Emergency Use Authorization (EUA).  This EUA will remain in effect (meaning this test can be used) for the duration of the COVID-19 declaration under Section 564(b)(1) of the Act, 21 U.S.C. section 360bbb-3(b)(1), unless the authorization is terminated or revoked sooner.  Performed at [facility], [address] [place], [place] [address]   Culture, blood (Routine X 2) w Reflex to ID Panel     Status: None   Collection Time: 04/24/23  7:52 PM   Specimen:  BLOOD  Result Value Ref Range Status   Specimen Description BLOOD RIGHT ANTECUBITAL  Final   Special Requests   Final    BOTTLES DRAWN AEROBIC AND ANAEROBIC Blood Culture adequate volume   Culture   Final    NO GROWTH 5 DAYS Performed at [facility], [address] [place], [place] [address]    Report Status 04/29/2023 FINAL  Final  Culture, blood (Routine X 2) w Reflex to ID Panel     Status: None   Collection Time: 04/24/23  7:56 PM   Specimen: BLOOD  Result Value Ref Range Status   Specimen Description BLOOD RIGHT ANTECUBITAL  Final   Special Requests   Final    BOTTLES DRAWN AEROBIC AND ANAEROBIC Blood Culture adequate volume   Culture   Final    NO GROWTH 5 DAYS Performed at [facility], [address] [place], [place] [address]    Report Status 04/29/2023 FINAL  Final  Respiratory (~20 pathogens) panel by PCR     Status: None   Collection Time: 04/25/23  2:01 PM   Specimen: Nasopharyngeal Swab; Respiratory  Result Value Ref Range Status   Adenovirus NOT DETECTED NOT DETECTED Final   Coronavirus 229E NOT DETECTED NOT DETECTED Final    Comment: (NOTE) The Coronavirus on the Respiratory Panel, DOES NOT test for the novel  Coronavirus (2019 nCoV)    Coronavirus HKU1 NOT DETECTED NOT DETECTED Final   Coronavirus NL63 NOT DETECTED NOT DETECTED Final   Coronavirus OC43 NOT DETECTED NOT DETECTED Final   Metapneumovirus NOT DETECTED NOT DETECTED Final   Rhinovirus / Enterovirus NOT DETECTED NOT DETECTED Final   Influenza A NOT DETECTED NOT DETECTED Final   Influenza B NOT DETECTED NOT DETECTED Final   Parainfluenza Virus 1 NOT DETECTED NOT DETECTED Final   Parainfluenza Virus 2 NOT DETECTED NOT DETECTED Final   Parainfluenza Virus 3 NOT DETECTED NOT DETECTED Final   Parainfluenza Virus 4 NOT DETECTED NOT DETECTED Final   Respiratory Syncytial Virus NOT DETECTED NOT DETECTED Final   Bordetella pertussis NOT DETECTED NOT DETECTED Final   Bordetella  Parapertussis NOT DETECTED NOT DETECTED Final   Chlamydophila pneumoniae NOT DETECTED NOT DETECTED Final   Mycoplasma pneumoniae NOT DETECTED NOT DETECTED Final    Comment: Performed at [facility], [address] [place], [place] [address]  Gastrointestinal Panel by PCR , Stool     Status: Abnormal   Collection Time: 04/26/23  6:56 AM   Specimen: Stool  Result Value Ref Range Status   Campylobacter species NOT DETECTED NOT DETECTED Final   Plesimonas shigelloides NOT DETECTED NOT DETECTED Final   Salmonella species NOT DETECTED NOT DETECTED Final   Yersinia enterocolitica NOT DETECTED NOT DETECTED Final   Vibrio species DETECTED (A) NOT DETECTED Final    Comment: RESULT CALLED TO, READ BACK BY AND VERIFIED WITH: JOSEPH HAYES AT 1914 04/29/23.PMF    Vibrio cholerae DETECTED (A) NOT DETECTED Final    Comment: RESULT CALLED TO, READ BACK BY AND VERIFIED WITH: JOSEPH HAYES AT 7829 04/29/23.PMF    Enteroaggregative E coli (EAEC) NOT DETECTED NOT DETECTED Final   Enteropathogenic E coli (EPEC) NOT DETECTED NOT DETECTED Final   Enterotoxigenic E coli (ETEC) NOT DETECTED NOT DETECTED Final   Shiga like toxin producing E coli (STEC) NOT DETECTED NOT DETECTED Final   Shigella/Enteroinvasive E coli (EIEC) NOT DETECTED NOT DETECTED Final   Cryptosporidium NOT DETECTED NOT DETECTED Final   Cyclospora cayetanensis NOT DETECTED NOT DETECTED Final   Entamoeba histolytica NOT DETECTED NOT DETECTED Final   Giardia lamblia NOT DETECTED NOT DETECTED Final   Adenovirus F40/41 NOT DETECTED NOT DETECTED Final   Astrovirus NOT DETECTED NOT DETECTED Final   Norovirus GI/GII NOT DETECTED NOT DETECTED Final   Rotavirus A NOT DETECTED NOT DETECTED Final   Sapovirus (I, II, IV, and V) NOT DETECTED NOT DETECTED Final    Comment: Performed at [facility], [address] [place], [place] [address]    Coagulation Studies: No results for input(s): "LABPROT", "INR" in the last 72  hours.   Urinalysis: No results for input(s): "COLORURINE", "LABSPEC", "PHURINE", "GLUCOSEU", "HGBUR", "BILIRUBINUR", "KETONESUR", "PROTEINUR", "UROBILINOGEN", "NITRITE", "LEUKOCYTESUR" in the last 72 hours.  Invalid input(s): "APPERANCEUR"    Imaging: No results found.   Medications:    sodium chloride 5 mL/hr at 05/02/23 0600     amLODipine  10 mg Oral Daily   apixaban  2.5 mg Oral BID   aspirin EC  81 mg Oral Daily   atorvastatin  80 mg Oral q1800   carvedilol  25 mg Oral Daily  Chlorhexidine Gluconate Cloth  6 each Topical Q0600   epoetin (EPOGEN/PROCRIT) injection  10,000 Units Intravenous Q M,W,F-HD   escitalopram  10 mg Oral Daily   furosemide  80 mg Intravenous Daily   gabapentin  300 mg Oral QHS   insulin aspart  0-5 Units Subcutaneous QHS   insulin aspart  0-6 Units Subcutaneous TID WC   insulin detemir  4 Units Subcutaneous BID   ipratropium-albuterol  3 mL Nebulization TID   irbesartan  150 mg Oral Daily   midodrine  5 mg Oral Once per day on Mon Wed Fri   multivitamin  1 tablet Oral QHS   pantoprazole  40 mg Oral Daily   sevelamer carbonate  1,600 mg Oral TID WC   traZODone  50 mg Oral Once per day on Mon Wed Fri   sodium chloride, acetaminophen **OR** acetaminophen, guaiFENesin-dextromethorphan, loperamide, ondansetron **OR** ondansetron (ZOFRAN) IV  Assessment/ Plan:  [name] is a 67 y.o.  female with end stage renal disease on hemodialysis, tracheostomy, hypertension, hyperlipidemia, diabetes mellitus type II insulin dependent, depression, and diabetic neuropathy who presents to [facility] for 04/23/2023 for Acute on chronic respiratory failure with hypoxia ([name]) [J96.21] Acute on chronic diastolic (congestive) heart failure ([name]) [I50.33]  [facility] Nephrology MWF [address] Left AVG 62kg  End Stage Kidney Disease:  -Patient receiving dialysis today, UF 1.5 L as tolerated. - Next treatment scheduled for Friday  Hypertension with chronic  kidney disease: current regimen of irbesartan, carvedilol and amlodipine. Home regimen of olmesartan instead of irbesartan. Also prescribed Midodrine to prevent hypotension during dialysis.   -Blood pressure 120/61 during dialysis.  Diabetes mellitus type II with chronic kidney disease: insulin dependent.   Glucose remains elevated at times. Sliding scale insulin per primary team.  Secondary Hyperparathyroidism -Will continue to monitor bone minerals during this admission. - Continue sevelamer with meals  Anemia of chronic kidney disease: -Continue EPO with dialysis   LOS: 8   5/29/20242:05 PM

## 2023-05-02 NOTE — Progress Notes (Signed)
Pt is is dialysis for 1200 trach check. RT will see pt at 1430.

## 2023-05-02 NOTE — Progress Notes (Signed)
[facility]  - [place] [facility]   PATIENT NAME: Deborah [name]    MR#:  [id number]  DATE OF BIRTH:  01-Feb-1956  SUBJECTIVE:  patient seen at dialysis earlier. No complaints. Still remains on 10 L wire trach collar.  VITALS:  Blood pressure 117/63, pulse 75, temperature (!) 97.1 F (36.2 C), temperature source Axillary, resp. rate 12, height 5\' 3"  (1.6 m), weight 64.2 kg, SpO2 96 %.  PHYSICAL EXAMINATION:   GENERAL:  67 y.o.-year-old patient with no acute distress.  LUNGS: Normal breath sounds bilaterally, no wheezing. Trach Collar+ CARDIOVASCULAR: S1, S2 normal. No murmur   ABDOMEN: Soft, nontender, nondistended. Bowel sounds present.  EXTREMITIES: No  edema b/l.   HD access+ NEUROLOGIC: nonfocal  patient is alert and awake SKIN: No obvious rash, lesion, or ulcer.   LABORATORY PANEL:  CBC Recent Labs  Lab 04/30/23 1000  WBC 11.3*  HGB 7.6*  HCT 23.0*  PLT 245    Chemistries  Recent Labs  Lab 05/02/23 0934  NA 138  K 4.3  CL 98  CO2 29  GLUCOSE 153*  BUN 45*  CREATININE 8.79*  CALCIUM 9.0   Assessment and Plan  Acute on chronic respiratory failure with hypoxia -Continue to require 10 L/min oxygen; usually less than 6 L/min at home -Chronic Tracheostomy status -CT chest showed vascular congestion/volume overload with acute on chronic diastolic heart failure -Nephrology consulted for dialysis -Swallow evaluation obtained, recommend regular consistency diet with thin liquids, aspiration precautions -Pulmonology consulted; started on inpatient physical and occupational therapy, and recommend outpatient PT/OT with pulmonary rehab.  Started on chest PT and incentive spirometry for bibasilar atelectasis.  -Chest x-ray obtained today shows unchanged left pleural effusion and basilar opacity, unchanged pulmonary edema, improvement right pleural effusion -Lasix 80 mg IV daily ordered per pulmonology --d/w Dr Rubye Beach down oxygen to keep sats  >89-90%   Community-acquired pneumonia -Had been on cefepime during previous hospital stay and was discharged on Levaquin; family never filled Levaquin prescription - cefepime has been restarted -Sputum culture also grew Corynebacterium species -ID was consulted;  2 d echo obtained to evaluate the functioning of the valve.  This revealed normal valves.  EF of 55-60.  Ventricular diastolic dysfunction  -Pseudomonas in -tracheal aspirate, because of SOB, recurrent hospitalization  - will treat as pseudomonas resp infection with cefepime for 10 days-total.  Can be given during dialysis.  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 Rivka Safer -She recommends to obtain stool culture and treat with azithromycin 1 g p.o. x 1.     ESRD -Patient on hemodialysis   Mitral valve replacement -2D echo showed normal functioning valve, EF 55 to 60%   Paroxysmal atrial fibrillation -Continue Eliquis, Coreg   Diabetes mellitus type 2 with ESRD -Continue Levemir, sliding scale insulin with NovoLog -CBG well-controlled   Hypertension -Blood pressure is stable -- amlodipine, Coreg,irbesartan --takes midodrine on HD days    Procedures: Family communication :none Consults : ID, pulmonary, nephrology CODE STATUS: full DVT Prophylaxis : eliquis Level of care: Progressive Status is: Inpatient Remains inpatient appropriate because: respiratory failure. Trying to wean oxygen down after 6 L for patient to go home with    TOTAL TIME TAKING CARE OF THIS PATIENT: 35 minutes.  >50% time spent on counselling and coordination of care  Note: This dictation was prepared with Dragon dictation along with smaller  Lobbyist. Any transcriptional errors that result from this process are unintentional.  Enedina Finner M.D    [facility]   CC: Primary care physician;  Si Gaul, MD

## 2023-05-02 NOTE — Progress Notes (Signed)
PULMONOLOGY         Date: 05/02/2023,   MRN# [id number] Deborah [name] 1956/04/25     AdmissionWeight:  (Unable to weigh , Pt on stretcher, too short of breath to stand)                 CurrentWeight: 64.2 kg  Referring provider: Dr Sharl Ma   CHIEF COMPLAINT:   Acute on chronic hypoxemic respiratory failure   HISTORY OF PRESENT ILLNESS   This is a pleasant 67 year old female with a history of cardiac dysfunction including diastolic heart failure, chronic atrial fibrillation with atrial flutter, cardiogenic shock, metabolic syndrome with diabetes, dyslipidemia and ESRD with chronic hypertension and is currently receiving dialysis 3 times weekly Monday Wednesday Friday, does have chronic respiratory insufficiency with episodes of hypoxemia tracheostomy status currently requiring 10 L supplemental O2, this particular hospitalization complicated by increased O2 requirement with difficulty weaning.  PCCM consultation for further evaluation management.  05/01/23-  patient in bed on 10L/min trache collar, in no distress this am. CXR with pulm edema and pleural effusion worse on left.   05/02/23- patient on 10L/min 40% Fio2.  S/p ID evaluation no additional antimicrobials at this time. She does have significant atelectasis still and is using metaneb device to help treat this.  Difficult to wean due to fluid shifting with HD and pulmonary edema.  Gave lasix challenge yesterday but I&O not documented .  For HD today.  Repeat CXR today.    PAST MEDICAL HISTORY   Past Medical History:  Diagnosis Date   Acute on chronic diastolic CHF (congestive heart failure) ([name]) 01/04/2022   Atrial fibrillation ([name])    Atrial flutter ([name])    Cardiogenic shock ([name]) 08/17/2016   Diabetes mellitus    Diabetes mellitus type 2, controlled ([name]) 03/08/2012   ESRD (end stage renal disease) ([name])    Heart attack ([name])    Hyperlipidemia    Hypertension    MI, old    NSTEMI (non-ST elevated myocardial  infarction) ([name])    Renal disorder    dialysls   Respiratory failure ([name]) 08/17/2016   ST elevation myocardial infarction involving left main coronary artery ([name])    STEMI (ST elevation myocardial infarction) ([name]) 08/15/2016   Type 2 diabetes mellitus with complication, with long-term current use of insulin [facility])      SURGICAL HISTORY   Past Surgical History:  Procedure Laterality Date   A/V FISTULAGRAM N/A 10/05/2017   Procedure: A/V Fistulagram;  Surgeon: Renford Dills, MD;  Location: [facility] INVASIVE CV LAB;  Service: Cardiovascular;  Laterality: N/A;   A/V SHUNTOGRAM N/A 10/05/2017   Procedure: A/V SHUNTOGRAM;  Surgeon: Renford Dills, MD;  Location: [facility] INVASIVE CV LAB;  Service: Cardiovascular;  Laterality: N/A;   AV FISTULA PLACEMENT Left 10/02/2016   Procedure: INSERTION OF ARTERIOVENOUS (AV) GORE-TEX GRAFT ARM;  Surgeon: Maeola Harman, MD;  Location: [facility] OR;  Service: Vascular;  Laterality: Left;   CARDIAC CATHETERIZATION N/A 08/15/2016   Procedure: Left Heart Cath and Coronary Angiography;  Surgeon: Alwyn Pea, MD;  Location: [facility] INVASIVE CV LAB;  Service: Cardiovascular;  Laterality: N/A;   CARDIAC CATHETERIZATION N/A 08/15/2016   Procedure: Coronary Stent Intervention;  Surgeon: Alwyn Pea, MD;  Location: [facility] INVASIVE CV LAB;  Service: Cardiovascular;  Laterality: N/A;   CARDIAC CATHETERIZATION N/A 08/18/2016   Procedure: Right Heart Cath;  Surgeon: Dolores Patty, MD;  Location: [facility] INVASIVE CV LAB;  Service: Cardiovascular;  Laterality:  N/A;   CARDIAC CATHETERIZATION N/A 08/18/2016   Procedure: IABP Insertion;  Surgeon: Dolores Patty, MD;  Location: [facility] INVASIVE CV LAB;  Service: Cardiovascular;  Laterality: N/A;   CARDIAC CATHETERIZATION Right 08/23/2016   Procedure: CENTRAL LINE INSERTION RIGHT SUBCLAVIAN;  Surgeon: Kerin Perna, MD;  Location: [facility] OR;  Service: Open Heart Surgery;  Laterality: Right;   ENDOVEIN HARVEST OF GREATER  SAPHENOUS VEIN Left 08/23/2016   Procedure: ENDOVEIN HARVEST OF GREATER SAPHENOUS VEIN;  Surgeon: Kerin Perna, MD;  Location: [facility] OR;  Service: Open Heart Surgery;  Laterality: Left;   INSERTION OF DIALYSIS CATHETER Bilateral 08/31/2016   Procedure: INSERTION OF DIALYSIS CATHETER LEFT INTERNAL JUGULAR VEIN & INSERTION OF TRIPLE LUMEN RIGHT INTERNAL JUGULAR VEIN;  Surgeon: Chuck Hint, MD;  Location[facility] OR;  Service: Vascular;  Laterality: Bilateral;   INTRAOPERATIVE TRANSESOPHAGEAL ECHOCARDIOGRAM N/A 08/23/2016   Procedure: INTRAOPERATIVE TRANSESOPHAGEAL ECHOCARDIOGRAM;  Surgeon: Kerin Perna, MD;  Location: [facility] OR;  Service: Open Heart Surgery;  Laterality: N/A;   IR GENERIC HISTORICAL  11/13/2016   IR US GUIDE VASC ACCESS LEFT 11/13/2016 Gilmer Mor, DO [facility]INTER[facility]   IR GENERIC HISTORICAL  11/13/2016   IR FLUORO GUIDE CV LINE LEFT 11/13/2016 Gilmer Mor, DO [facility]   IR GENERIC HISTORICAL  11/13/2016   IR GASTROSTOMY TUBE MOD SED 11/13/2016 Gilmer Mor, DO [facility]   MITRAL VALVE REPAIR N/A 08/23/2016   Procedure: MITRAL VALVE REPAIR (MVR) USING EDWARDS MAGNA EASE BIOPROSTHESIS MITRAL  VALVE;  Surgeon: Kerin Perna, MD;  Location: [facility] OR;  Service: Open Heart Surgery;  Laterality: N/A;   tracheostomy reversal     TRACHEOSTOMY TUBE PLACEMENT N/A 11/09/2016   Procedure: TRACHEOSTOMY;  Surgeon: Suzanna Obey, MD;  Location: [facility] OR;  Service: ENT;  Laterality: N/A;   VAGINAL DELIVERY     x 6     FAMILY HISTORY   Family History  Problem Relation Age of Onset   Hypertension Mother    Diabetes Mother    Breast cancer Sister    Hypertension Father      SOCIAL HISTORY   Social History   Tobacco Use   Smoking status: Never   Smokeless tobacco: Never  Substance Use Topics   Alcohol use: No   Drug use: No     MEDICATIONS    Home Medication:    Current Medication:  Current Facility-Administered Medications:    0.9 %  sodium chloride infusion, ,  Intravenous, PRN, [name], MD, Last Rate: 5 mL/hr at 05/02/23 0600, Infusion Verify at 05/02/23 0600   acetaminophen (TYLENOL) tablet 650 mg, 650 mg, Oral, Q6H PRN, 650 mg at 04/25/23 1337 **OR** acetaminophen (TYLENOL) suppository 650 mg, 650 mg, Rectal, Q6H PRN, Sharl Ma, Sarina Ill, MD   amLODipine (NORVASC) tablet 10 mg, 10 mg, Oral, Daily, Sharl Ma, Gagan S, MD, 10 mg at 05/01/23 0854   apixaban (ELIQUIS) tablet 2.5 mg, 2.5 mg, Oral, BID, Sharl Ma, Gagan S, MD, 2.5 mg at 05/02/23 1610   aspirin EC tablet 81 mg, 81 mg, Oral, Daily, Sharl Ma, Gagan S, MD, 81 mg at 05/02/23 0826   atorvastatin (LIPITOR) tablet 80 mg, 80 mg, Oral, q1800, [name], MD, 80 mg at 04/30/23 2130   carvedilol (COREG) tablet 25 mg, 25 mg, Oral, Daily, Karna Christmas, Amnah Breuer, MD   Chlorhexidine Gluconate Cloth 2 % PADS 6 each, 6 each, Topical, Q0600, [name], MD, 6 each at 05/01/23 0539   epoetin alfa (EPOGEN) injection 10,000 Units,  10,000 Units, Intravenous, Q M,W,F-HD, Sharl Ma, Sarina Ill, MD, 10,000 Units at 04/30/23 1132   escitalopram (LEXAPRO) tablet 10 mg, 10 mg, Oral, Daily, Sharl Ma, Gagan S, MD, 10 mg at 05/02/23 6606   furosemide (LASIX) injection 80 mg, 80 mg, Intravenous, Daily, Karna Christmas, Heavyn Yearsley, MD, 80 mg at 05/01/23 1435   gabapentin (NEURONTIN) capsule 300 mg, 300 mg, Oral, QHS, Sharl Ma, Gagan S, MD, 300 mg at 05/01/23 2121   guaiFENesin-dextromethorphan (ROBITUSSIN DM) 100-10 MG/5ML syrup 15 mL, 15 mL, Oral, Q4H PRN, Sharl Ma, Gagan S, MD, 15 mL at 05/01/23 1445   insulin aspart (novoLOG) injection 0-5 Units, 0-5 Units, Subcutaneous, QHS, [name], MD, 2 Units at 05/01/23 2133   insulin aspart (novoLOG) injection 0-6 Units, 0-6 Units, Subcutaneous, TID WC, [name], MD, 2 Units at 05/01/23 1935   insulin detemir (LEVEMIR) injection 4 Units, 4 Units, Subcutaneous, BID, Sharl Ma, Sarina Ill, MD, 4 Units at 05/02/23 3016   ipratropium-albuterol (DUONEB) 0.5-2.5 (3) MG/3ML nebulizer solution 3 mL, 3 mL, Nebulization, TID, Sharl Ma, Sarina Ill, MD, 3 mL at 05/02/23 0734   irbesartan (AVAPRO) tablet 150 mg, 150 mg, Oral, Daily, Sharl Ma, Gagan S, MD, 150 mg at 05/01/23 0854   loperamide (IMODIUM) capsule 2 mg, 2 mg, Oral, PRN, [name], MD, 2 mg at 05/01/23 2121   midodrine (PROAMATINE) tablet 5 mg, 5 mg, Oral, Once per day on Mon Wed Fri, Lama, Gagan S, MD, 5 mg at 05/02/23 0109   multivitamin (RENA-VIT) tablet 1 tablet, 1 tablet, Oral, QHS, Sharl Ma, Sarina Ill, MD, 1 tablet at 05/01/23 2121   ondansetron (ZOFRAN) tablet 4 mg, 4 mg, Oral, Q6H PRN, 4 mg at 04/23/23 2346 **OR** ondansetron (ZOFRAN) injection 4 mg, 4 mg, Intravenous, Q6H PRN, Sharl Ma, Sarina Ill, MD, 4 mg at 04/27/23 0043   pantoprazole (PROTONIX) EC tablet 40 mg, 40 mg, Oral, Daily, Sharl Ma, Gagan S, MD, 40 mg at 05/02/23 3235   sevelamer carbonate (RENVELA) tablet 1,600 mg, 1,600 mg, Oral, TID WC, Sharl Ma, Gagan S, MD, 1,600 mg at 05/02/23 5732   traZODone (DESYREL) tablet 50 mg, 50 mg, Oral, Once per day on Mon Wed Fri, Lama, Gagan S, MD, 50 mg at 04/30/23 2129    ALLERGIES   Patient has no known allergies.     REVIEW OF SYSTEMS    Review of Systems:  Gen:  Denies  fever, sweats, chills weigh loss  HEENT: Denies blurred vision, double vision, ear pain, eye pain, hearing loss, nose bleeds, sore throat Cardiac:  No dizziness, chest pain or heaviness, chest tightness,edema Resp:   reports dyspnea chronically  Gi: Denies swallowing difficulty, stomach pain, nausea or vomiting, diarrhea, constipation, bowel incontinence Gu:  Denies bladder incontinence, burning urine Ext:   Denies Joint pain, stiffness or swelling Skin: Denies  skin rash, easy bruising or bleeding or hives Endoc:  Denies polyuria, polydipsia , polyphagia or weight change Psych:   Denies depression, insomnia or hallucinations   Other:  All other systems negative   VS: BP (!) 114/56 (BP Location: Right Arm)   Pulse 72   Temp (!) 97.1 F (36.2 C) (Axillary)   Resp 20   Ht 5\' 3"  (1.6 m)   Wt 64.2 kg    SpO2 95%   BMI 25.07 kg/m      PHYSICAL EXAM    GENERAL:NAD, no fevers, chills, no weakness no fatigue HEAD: Normocephalic, atraumatic.  EYES: Pupils equal, round, reactive to light. Extraocular muscles intact. No scleral icterus.  MOUTH: Moist mucosal  membrane. Dentition intact. No abscess noted.  EAR, NOSE, THROAT: Clear without exudates. No external lesions.  NECK: Supple. No thyromegaly. No nodules. No JVD.  PULMONARY: decreased breath sounds with mild rhonchi worse at bases bilaterally.  CARDIOVASCULAR: S1 and S2. Regular rate and rhythm. No murmurs, rubs, or gallops. No edema. Pedal pulses 2+ bilaterally.  GASTROINTESTINAL: Soft, nontender, nondistended. No masses. Positive bowel sounds. No hepatosplenomegaly.  MUSCULOSKELETAL: No swelling, clubbing, or edema. Range of motion full in all extremities.  NEUROLOGIC: Cranial nerves II through XII are intact. No gross focal neurological deficits. Sensation intact. Reflexes intact.  SKIN: No ulceration, lesions, rashes, or cyanosis. Skin warm and dry. Turgor intact.  PSYCHIATRIC: Mood, affect within normal limits. The patient is awake, alert and oriented x 3. Insight, judgment intact.       IMAGING   Narrative & Impression  CLINICAL DATA:  Atelectasis.   EXAM: PORTABLE CHEST 1 VIEW   COMPARISON:  Radiograph and CT 04/23/2023   FINDINGS: Tracheostomy tube tip at the thoracic inlet. Median sternotomy and prosthetic cardiac valve. Stable cardiomegaly. Improvement in right pleural effusion. Similar left pleural effusion and basilar opacity. Pulmonary edema is unchanged. No pneumothorax.   IMPRESSION: 1. Improvement in right pleural effusion. 2. Unchanged left pleural effusion and basilar opacity. 3. Unchanged pulmonary edema.       -pulm edema and bilateral effusions worse on left - CXR 04/29/23 - independent review     ASSESSMENT/PLAN     Acute on chronic hypoxemic respiratory failure Status post chest  x-ray-interval changes with improved right lower lobe however left hemithorax with interstitial edema and atelectasis. -CRP is within reference range suggestive of absence of inflammatory changes in low likelihood of infectious or inflammatory pneumonia, there is a very mild leukocytosis 11.3 which may be due to underlying GI cholera. -have reduced bb to once daily with coreg and ordered lasix IV 80mg  for challenge today   Bibasilar atelectasis -Due to compressive effects with surrounding pleural effusion as well as chronic deconditioning and interstitial edema with development of atelectatic segments.  Patient should start chronic physical therapy and Occupational Therapy with pulmonary rehab on outpatient basis, in the interim recruitment maneuvers with MetaNeb device utilizing albuterol as well as utilization of chest physiotherapy with incentive spirometry each hour.  continue inpatient physical and occupational therapy Patient is also on DuoNeb nebulizer therapy which should also help treat atelectasis.   Interstitial edema -Nephrology is on case patient is currently being dialyzed -Complex fluid management due to ESRD -lasix 80mg  IV x daily     Thank you for allowing me to participate in the care of this patient.   Patient/Family are satisfied with care plan and all questions have been answered.    Provider disclosure: Patient with at least one acute or chronic illness or injury that poses a threat to life or bodily function and is being managed actively during this encounter.  All of the below services have been performed independently by signing provider:  review of prior documentation from internal and or external health records.  Review of previous and current lab results.  Interview and comprehensive assessment during patient visit today. Review of current and previous chest radiographs/CT scans. Discussion of management and test interpretation with health care team and  patient/family.   This document was prepared using Dragon voice recognition software and may include unintentional dictation errors.     Vida Rigger, M.D.  Division of Pulmonary & Critical Care Medicine

## 2023-05-02 NOTE — Procedures (Signed)
Received patient in bed to unit.  Alert and oriented.  Informed consent signed and in chart.   TX duration:3.5  Patient tolerated well.  Transported back to the room  Alert, without acute distress.  Hand-off given to patient's nurse.   Access used: Left upper arm fistua Access issues: NONE  Total UF removed: Medication(s) given: EPOGEN 10000 units.    [facility]

## 2023-05-03 DIAGNOSIS — J9621 Acute and chronic respiratory failure with hypoxia: Secondary | ICD-10-CM | POA: Diagnosis not present

## 2023-05-03 LAB — GLUCOSE, CAPILLARY
Glucose-Capillary: 114 mg/dL — ABNORMAL HIGH (ref 70–99)
Glucose-Capillary: 202 mg/dL — ABNORMAL HIGH (ref 70–99)
Glucose-Capillary: 231 mg/dL — ABNORMAL HIGH (ref 70–99)
Glucose-Capillary: 197 mg/dL — ABNORMAL HIGH (ref 70–99)

## 2023-05-03 MED ORDER — TRAZODONE HCL 50 MG PO TABS
50.0000 mg | ORAL_TABLET | Freq: Once | ORAL | Status: AC
  Administered 2023-05-03: 50 mg via ORAL

## 2023-05-03 MED ORDER — CARVEDILOL 6.25 MG PO TABS
6.2500 mg | ORAL_TABLET | Freq: Two times a day (BID) | ORAL | Status: DC
  Administered 2023-05-04 – 2023-05-08 (×8): 6.25 mg via ORAL
  Filled 2023-05-03 (×7): qty 1

## 2023-05-03 NOTE — Evaluation (Signed)
Physical Therapy Evaluation Patient Details Name: Deborah [name] MRN: [id number] DOB: 26-Jun-1956 Today's Date: 05/03/2023  History of Present Illness  67 year old African-American female history of end-stage renal disease on dialysis Monday Wednesday Friday, paroxysmal atrial fibrillation on Eliquis, history of tracheostomy, chronic diastolic heart failure, chronic hypoxia at night, uncontrolled type 2 diabetes, hypertension, presents the ER today with worsening cough and hypoxia.  Patient was recently discharged the hospital yesterday after spending 5 days in the hospital with pneumonia. Workup for fluid overload, CHF exacerbation.   Clinical Impression  Patient A&Ox4, covers trach with finger to speak with PT/OT. Per pt at baseline she lives with her son, independent for ADLs, he performs homemaking duties and they use transportation as needed. Ambulatory for household distances primarily.  The patient was able to perform supine to sit with CGA, fair sitting balance. Sit <> stand without RW, CGA but pt returned to sitting due to feeling SOB. After a seated rest break she was able to stand and ambulate ~24ft with close chair follow, CGAx2 for safety.  Overall the patient demonstrated deficits (see "PT Problem List") that impede the patient's functional abilities, safety, and mobility and would benefit from skilled PT intervention. Recommendation is to continue skilled PT services to maximize independence and function.        Recommendations for follow up therapy are one component of a multi-disciplinary discharge planning process, led by the attending physician.  Recommendations may be updated based on patient status, additional functional criteria and insurance authorization.  Follow Up Recommendations       Assistance Recommended at Discharge Intermittent Supervision/Assistance  Patient can return home with the following  Assistance with cooking/housework;Assist for transportation;Help with  stairs or ramp for entrance;A little help with walking and/or transfers    Equipment Recommendations None recommended by PT  Recommendations for Other Services       Functional Status Assessment Patient has had a recent decline in their functional status and demonstrates the ability to make significant improvements in function in a reasonable and predictable amount of time.     Precautions / Restrictions Precautions Precautions: Fall Precaution Comments: try to keep O2 at or above 88-90% Restrictions Weight Bearing Restrictions: No      Mobility  Bed Mobility Overal bed mobility: Needs Assistance Bed Mobility: Supine to Sit     Supine to sit: Min guard          Transfers Overall transfer level: Needs assistance Equipment used: Rolling walker (2 wheels) Transfers: Sit to/from Stand Sit to Stand: Min guard, +2 safety/equipment                Ambulation/Gait   Gait Distance (Feet): 70 Feet Assistive device: Rolling walker (2 wheels)         General Gait Details: chair folow for safety, no LOB noted  Stairs            Wheelchair Mobility    Modified Rankin (Stroke Patients Only)       Balance Overall balance assessment: Needs assistance Sitting-balance support: No upper extremity supported, Feet supported Sitting balance-Leahy Scale: Fair     Standing balance support: Single extremity supported, Bilateral upper extremity supported, During functional activity, Reliant on assistive device for balance Standing balance-Leahy Scale: Fair                               Pertinent Vitals/Pain Pain Assessment Pain Assessment: No/denies pain  Home Living Family/patient expects to be discharged to:: Private residence Living Arrangements: Children (son) Available Help at Discharge: Family;Available PRN/intermittently;Personal care attendant (son works, PCA for M/W/F dialysis, T/R on her own while son is at work) Type of Home:  Apartment Home Access: Level entry       Home Layout: One level Home Equipment: BSC/3in1;Rollator (4 wheels);Rolling Walker (2 wheels);Transport chair      Prior Function Prior Level of Function : Needs assist             Mobility Comments: no falls, son with her when she walks ADLs Comments: indep with basic ADL, son helps with IADL, gets transportation     Hand Dominance        Extremity/Trunk Assessment   Upper Extremity Assessment Upper Extremity Assessment: Overall WFL for tasks assessed    Lower Extremity Assessment Lower Extremity Assessment: Overall WFL for tasks assessed    Cervical / Trunk Assessment Cervical / Trunk Assessment: Normal;Other exceptions Cervical / Trunk Exceptions: chronic trach  Communication   Communication: Tracheostomy  Cognition Arousal/Alertness: Awake/alert Behavior During Therapy: WFL for tasks assessed/performed Overall Cognitive Status: Within Functional Limits for tasks assessed                                          General Comments General comments (skin integrity, edema, etc.): SpO2 EOB 87-90%, improving with mobility to 88-94%    Exercises Other Exercises Other Exercises: pericare in standing maxA   Assessment/Plan    PT Assessment Patient needs continued PT services  PT Problem List Decreased mobility;Decreased strength;Decreased activity tolerance;Decreased balance;Decreased knowledge of use of DME       PT Treatment Interventions DME instruction;Therapeutic activities;Gait training;Therapeutic exercise;Patient/family education;Balance training;Stair training;Functional mobility training;Neuromuscular re-education    PT Goals (Current goals can be found in the Care Plan section)  Acute Rehab PT Goals Patient Stated Goal: to go home PT Goal Formulation: With patient Time For Goal Achievement: 05/17/23 Potential to Achieve Goals: Good    Frequency Min 2X/week     Co-evaluation    Reason for Co-Treatment: For patient/therapist safety;To address functional/ADL transfers;Complexity of the patient's impairments (multi-system involvement) PT goals addressed during session: Mobility/safety with mobility;Balance;Proper use of DME OT goals addressed during session: ADL's and self-care;Proper use of Adaptive equipment and DME       AM-PAC PT "6 Clicks" Mobility  Outcome Measure Help needed turning from your back to your side while in a flat bed without using bedrails?: A Little Help needed moving from lying on your back to sitting on the side of a flat bed without using bedrails?: A Little Help needed moving to and from a bed to a chair (including a wheelchair)?: None Help needed standing up from a chair using your arms (e.g., wheelchair or bedside chair)?: None Help needed to walk in hospital room?: None Help needed climbing 3-5 steps with a railing? : A Little 6 Click Score: 21    End of Session Equipment Utilized During Treatment: Oxygen;Other (comment) (trach collar, 10L) Activity Tolerance: Patient tolerated treatment well Patient left: in chair;with call bell/phone within reach;with chair alarm set Nurse Communication: Mobility status PT Visit Diagnosis: Other abnormalities of gait and mobility (R26.89);Muscle weakness (generalized) (M62.81)    Time: 8119-1478 PT Time Calculation (min) (ACUTE ONLY): 29 min   Charges:   PT Evaluation $PT Eval Moderate Complexity: 1 Mod PT Treatments $Therapeutic Activity:  8-22 mins        Olga Coaster PT, DPT 2:58 PM,05/03/23

## 2023-05-03 NOTE — Evaluation (Signed)
Occupational Therapy Evaluation Patient Details Name: Deborah [name] MRN: [id number] DOB: February 20, 1956 Today's Date: 05/03/2023   History of Present Illness 67 year old African-American female history of end-stage renal disease on dialysis Monday Wednesday Friday, paroxysmal atrial fibrillation on Eliquis, history of tracheostomy, chronic diastolic heart failure, chronic hypoxia at night, uncontrolled type 2 diabetes, hypertension, presents the ER today with worsening cough and hypoxia.  Patient was recently discharged the hospital yesterday after spending 5 days in the hospital with pneumonia. Workup for fluid overload, CHF exacerbation.   Clinical Impression   Pt was seen for OT evaluation this date and cotx with PT for safety and lines/leads/O2/chair follow assist. Prior to hospital admission, pt was generally indep with short household distance mobility, using transport chair with son for community mobility and generally indep with basic ADL (takes tub baths). No falls in past 58mo. She reports having an aide/RN to transport her to dialysis M/W/F and son works during the day so T/R she's home by herself. Pt lives in level entry apartment. Pt presents to acute OT demonstrating impaired ADL performance and functional mobility 2/2 cardiopulmonary status, decr activity tolerance, and impaired balance (See OT problem list for additional functional deficits). Pt currently requires CGA for bed mobility, ADL transfers, ambulation with RW, and MIN A for LB ADL tasks and MAX A for pericare in standing. Pt endorsed SOB with initial stand EOB which was impetus for pericare assist while she kept UE support on the RW. Pt would benefit from skilled OT services to address noted impairments an functional limitations (see below for any additional details) in order to maximize safety and independence while minimizing falls risk and caregiver burden. Do anticipate need for additional skilled OT services.    Recommendations  for follow up therapy are one component of a multi-disciplinary discharge planning process, led by the attending physician.  Recommendations may be updated based on patient status, additional functional criteria and insurance authorization.   Assistance Recommended at Discharge Intermittent Supervision/Assistance  Patient can return home with the following A little help with walking and/or transfers;A little help with bathing/dressing/bathroom;Assistance with cooking/housework;Assist for transportation;Help with stairs or ramp for entrance    Functional Status Assessment  Patient has had a recent decline in their functional status and demonstrates the ability to make significant improvements in function in a reasonable and predictable amount of time.  Equipment Recommendations  None recommended by OT    Recommendations for Other Services       Precautions / Restrictions Precautions Precautions: Fall Precaution Comments: try to keep O2 at or above 88-90% Restrictions Weight Bearing Restrictions: No      Mobility Bed Mobility Overal bed mobility: Needs Assistance Bed Mobility: Supine to Sit     Supine to sit: Min guard          Transfers Overall transfer level: Needs assistance Equipment used: Rolling walker (2 wheels) Transfers: Sit to/from Stand Sit to Stand: Min guard                  Balance Overall balance assessment: Needs assistance Sitting-balance support: No upper extremity supported, Feet supported Sitting balance-Leahy Scale: Fair     Standing balance support: Single extremity supported, Bilateral upper extremity supported, During functional activity, Reliant on assistive device for balance Standing balance-Leahy Scale: Fair                             ADL either performed or assessed with clinical  judgement   ADL Overall ADL's : Needs assistance/impaired                                     Functional mobility during  ADLs: Min guard;+2 for safety/equipment;Rolling walker (2 wheels) General ADL Comments: MIN A for LB ADL tasks, MAX A for pericare in standing.     Vision         Perception     Praxis      Pertinent Vitals/Pain Pain Assessment Pain Assessment: No/denies pain     Hand Dominance     Extremity/Trunk Assessment Upper Extremity Assessment Upper Extremity Assessment: Overall WFL for tasks assessed   Lower Extremity Assessment Lower Extremity Assessment: Defer to PT evaluation   Cervical / Trunk Assessment Cervical / Trunk Assessment: Normal   Communication Communication Communication: Tracheostomy   Cognition Arousal/Alertness: Awake/alert Behavior During Therapy: WFL for tasks assessed/performed Overall Cognitive Status: Within Functional Limits for tasks assessed                                       General Comments  SpO2 EOB 87-90%, improving with mobility to 88-94%    Exercises     Shoulder Instructions      Home Living Family/patient expects to be discharged to:: Private residence Living Arrangements: Children (son) Available Help at Discharge: Family;Available PRN/intermittently;Personal care attendant (son works, PCA for M/W/F dialysis, T/R on her own while son is at work) Type of Home: Apartment Home Access: Level entry     Home Layout: One level     Bathroom Shower/Tub: Chief Strategy Officer: Standard     Home Equipment: BSC/3in1;Rollator (4 wheels);Rolling Walker (2 wheels);Transport chair          Prior Functioning/Environment Prior Level of Function : Needs assist             Mobility Comments: no falls, son with her when she walks ADLs Comments: indep with basic ADL, son helps with IADL, gets transportation        OT Problem List: Decreased activity tolerance;Impaired balance (sitting and/or standing);Decreased knowledge of use of DME or AE;Cardiopulmonary status limiting activity      OT  Treatment/Interventions: Self-care/ADL training;Therapeutic exercise;Therapeutic activities;Energy conservation;DME and/or AE instruction;Patient/family education;Balance training    OT Goals(Current goals can be found in the care plan section) Acute Rehab OT Goals Patient Stated Goal: breathe better and go home OT Goal Formulation: With patient Time For Goal Achievement: 05/17/23 Potential to Achieve Goals: Good ADL Goals Pt Will Perform Lower Body Dressing: with modified independence;sit to/from stand Pt Will Transfer to Toilet: with modified independence Pt Will Perform Toileting - Clothing Manipulation and hygiene: with modified independence  OT Frequency: Min 1X/week    Co-evaluation PT/OT/SLP Co-Evaluation/Treatment: Yes Reason for Co-Treatment: For patient/therapist safety;To address functional/ADL transfers;Complexity of the patient's impairments (multi-system involvement) PT goals addressed during session: Mobility/safety with mobility;Balance;Proper use of DME OT goals addressed during session: ADL's and self-care;Proper use of Adaptive equipment and DME      AM-PAC OT "6 Clicks" Daily Activity     Outcome Measure Help from another person eating meals?: None Help from another person taking care of personal grooming?: None Help from another person toileting, which includes using toliet, bedpan, or urinal?: A Little Help from another person bathing (including washing, rinsing, drying)?:  A Little Help from another person to put on and taking off regular upper body clothing?: None Help from another person to put on and taking off regular lower body clothing?: A Little 6 Click Score: 21   End of Session Equipment Utilized During Treatment: Rolling walker (2 wheels);Oxygen Nurse Communication: Mobility status  Activity Tolerance: Patient tolerated treatment well Patient left: in chair;with call bell/phone within reach;with chair alarm set  OT Visit Diagnosis: Other  abnormalities of gait and mobility (R26.89)                Time: 1610-9604 OT Time Calculation (min): 28 min Charges:  OT General Charges $OT Visit: 1 Visit OT Evaluation $OT Eval Moderate Complexity: 1 Mod  Arman Filter., MPH, MS, OTR/L ascom [phone] 05/03/23, 2:34 PM

## 2023-05-03 NOTE — Progress Notes (Signed)
PULMONOLOGY         Date: 05/03/2023,   MRN# [id number] Deborah [name] 1956-02-12     AdmissionWeight:  (Unable to weigh , Pt on stretcher, too short of breath to stand)                 CurrentWeight: 64.4 kg  Referring provider: Dr Sharl Ma   CHIEF COMPLAINT:   Acute on chronic hypoxemic respiratory failure   HISTORY OF PRESENT ILLNESS   This is a pleasant 67 year old female with a history of cardiac dysfunction including diastolic heart failure, chronic atrial fibrillation with atrial flutter, cardiogenic shock, metabolic syndrome with diabetes, dyslipidemia and ESRD with chronic hypertension and is currently receiving dialysis 3 times weekly Monday Wednesday Friday, does have chronic respiratory insufficiency with episodes of hypoxemia tracheostomy status currently requiring 10 L supplemental O2, this particular hospitalization complicated by increased O2 requirement with difficulty weaning.  PCCM consultation for further evaluation management.  05/01/23-  patient in bed on 10L/min trache collar, in no distress this am. CXR with pulm edema and pleural effusion worse on left.   05/02/23- patient on 10L/min 40% Fio2.  S/p ID evaluation no additional antimicrobials at this time. She does have significant atelectasis still and is using metaneb device to help treat this.  Difficult to wean due to fluid shifting with HD and pulmonary edema.  Gave lasix challenge yesterday but I&O not documented .  For HD today.  Repeat CXR today.   05/03/23- Patient s/p 1.5L UF removed via HD yesterday.   She continues to require 10L/min 40%FiO2.  She has not walked in 12 days.  She is generally ambulatory in outpatient dialysis center. We will initiate OT/PT.  Continue current abx.  Continue current Metaneb schedule.  She made no urine today despite lasix 80mg  so we will be solely dependent on strict I&O plus RRT.    PAST MEDICAL HISTORY   Past Medical History:  Diagnosis Date   Acute on chronic  diastolic CHF (congestive heart failure) ([name]) 01/04/2022   Atrial fibrillation ([name])    Atrial flutter ([name])    Cardiogenic shock ([name]) 08/17/2016   Diabetes mellitus    Diabetes mellitus type 2, controlled ([name]) 03/08/2012   ESRD (end stage renal disease) ([name])    Heart attack ([name])    Hyperlipidemia    Hypertension    MI, old    NSTEMI (non-ST elevated myocardial infarction) ([name])    Renal disorder    dialysls   Respiratory failure ([name]) 08/17/2016   ST elevation myocardial infarction involving left main coronary artery ([name])    STEMI (ST elevation myocardial infarction) ([name]) 08/15/2016   Type 2 diabetes mellitus with complication, with long-term current use of insulin [facility])      SURGICAL HISTORY   Past Surgical History:  Procedure Laterality Date   A/V FISTULAGRAM N/A 10/05/2017   Procedure: A/V Fistulagram;  Surgeon: Renford Dills, MD;  Location: [facility] INVASIVE CV LAB;  Service: Cardiovascular;  Laterality: N/A;   A/V SHUNTOGRAM N/A 10/05/2017   Procedure: A/V SHUNTOGRAM;  Surgeon: Renford Dills, MD;  Location: [facility] INVASIVE CV LAB;  Service: Cardiovascular;  Laterality: N/A;   AV FISTULA PLACEMENT Left 10/02/2016   Procedure: INSERTION OF ARTERIOVENOUS (AV) GORE-TEX GRAFT ARM;  Surgeon: Maeola Harman, MD;  Location: [facility] OR;  Service: Vascular;  Laterality: Left;   CARDIAC CATHETERIZATION N/A 08/15/2016   Procedure: Left Heart Cath and Coronary Angiography;  Surgeon: Alwyn Pea, MD;  Location:  [facility] INVASIVE CV LAB;  Service: Cardiovascular;  Laterality: N/A;   CARDIAC CATHETERIZATION N/A 08/15/2016   Procedure: Coronary Stent Intervention;  Surgeon: Alwyn Pea, MD;  Location: [facility] INVASIVE CV LAB;  Service: Cardiovascular;  Laterality: N/A;   CARDIAC CATHETERIZATION N/A 08/18/2016   Procedure: Right Heart Cath;  Surgeon: Dolores Patty, MD;  Location: [facility] INVASIVE CV LAB;  Service: Cardiovascular;  Laterality: N/A;   CARDIAC CATHETERIZATION  N/A 08/18/2016   Procedure: IABP Insertion;  Surgeon: Dolores Patty, MD;  Location: [facility] INVASIVE CV LAB;  Service: Cardiovascular;  Laterality: N/A;   CARDIAC CATHETERIZATION Right 08/23/2016   Procedure: CENTRAL LINE INSERTION RIGHT SUBCLAVIAN;  Surgeon: Kerin Perna, MD;  Location: [facility] OR;  Service: Open Heart Surgery;  Laterality: Right;   ENDOVEIN HARVEST OF GREATER SAPHENOUS VEIN Left 08/23/2016   Procedure: ENDOVEIN HARVEST OF GREATER SAPHENOUS VEIN;  Surgeon: Kerin Perna, MD;  Location: [facility] OR;  Service: Open Heart Surgery;  Laterality: Left;   INSERTION OF DIALYSIS CATHETER Bilateral 08/31/2016   Procedure: INSERTION OF DIALYSIS CATHETER LEFT INTERNAL JUGULAR VEIN & INSERTION OF TRIPLE LUMEN RIGHT INTERNAL JUGULAR VEIN;  Surgeon: Chuck Hint, MD;  Location: [facility] OR;  Service: Vascular;  Laterality: Bilateral;   INTRAOPERATIVE TRANSESOPHAGEAL ECHOCARDIOGRAM N/A 08/23/2016   Procedure: INTRAOPERATIVE TRANSESOPHAGEAL ECHOCARDIOGRAM;  Surgeon: Kerin Perna, MD;  Location: [facility] OR;  Service: Open Heart Surgery;  Laterality: N/A;   IR GENERIC HISTORICAL  11/13/2016   IR US GUIDE VASC ACCESS LEFT 11/13/2016 Gilmer Mor, DO [facility]   IR GENERIC HISTORICAL  11/13/2016   IR FLUORO GUIDE CV LINE LEFT 11/13/2016 Gilmer Mor, DO [facility]   IR GENERIC HISTORICAL  11/13/2016   IR GASTROSTOMY TUBE MOD SED 11/13/2016 Gilmer Mor, DO [facility]   MITRAL VALVE REPAIR N/A 08/23/2016   Procedure: MITRAL VALVE REPAIR (MVR) USING EDWARDS MAGNA EASE BIOPROSTHESIS MITRAL  VALVE;  Surgeon: Kerin Perna, MD;  Location: [facility] OR;  Service: Open Heart Surgery;  Laterality: N/A;   tracheostomy reversal     TRACHEOSTOMY TUBE PLACEMENT N/A 11/09/2016   Procedure: TRACHEOSTOMY;  Surgeon: Suzanna Obey, MD;  Location: [facility] OR;  Service: ENT;  Laterality: N/A;   VAGINAL DELIVERY     x 6     FAMILY HISTORY   Family History  Problem Relation Age of Onset   Hypertension Mother     Diabetes Mother    Breast cancer Sister    Hypertension Father      SOCIAL HISTORY   Social History   Tobacco Use   Smoking status: Never   Smokeless tobacco: Never  Substance Use Topics   Alcohol use: No   Drug use: No     MEDICATIONS    Home Medication:    Current Medication:  Current Facility-Administered Medications:    0.9 %  sodium chloride infusion, , Intravenous, PRN, [name], MD, Last Rate: 5 mL/hr at 05/02/23 0600, Infusion Verify at 05/02/23 0600   acetaminophen (TYLENOL) tablet 650 mg, 650 mg, Oral, Q6H PRN, 650 mg at 04/25/23 1337 **OR** acetaminophen (TYLENOL) suppository 650 mg, 650 mg, Rectal, Q6H PRN, Sharl Ma, Sarina Ill, MD   apixaban (ELIQUIS) tablet 2.5 mg, 2.5 mg, Oral, BID, Sharl Ma, Gagan S, MD, 2.5 mg at 05/03/23 0940   aspirin EC tablet 81 mg, 81 mg, Oral, Daily, Sharl Ma, Gagan S, MD, 81 mg at 05/03/23 0940   atorvastatin (LIPITOR) tablet 80 mg, 80 mg, Oral, q1800, Sharl Ma, Sarina Ill, MD, 80  mg at 05/02/23 1906   [START ON 05/04/2023] carvedilol (COREG) tablet 6.25 mg, 6.25 mg, Oral, BID WC, Enedina Finner, MD   Chlorhexidine Gluconate Cloth 2 % PADS 6 each, 6 each, Topical, Q0600, [name], MD, 6 each at 05/03/23 0516   epoetin alfa (EPOGEN) injection 10,000 Units, 10,000 Units, Intravenous, Q M,W,F-HD, Sharl Ma, Sarina Ill, MD, 10,000 Units at 05/02/23 1321   escitalopram (LEXAPRO) tablet 10 mg, 10 mg, Oral, Daily, Sharl Ma, Gagan S, MD, 10 mg at 05/03/23 0940   furosemide (LASIX) injection 80 mg, 80 mg, Intravenous, Daily, Karna Christmas, Lucilla Petrenko, MD, 80 mg at 05/03/23 9562   gabapentin (NEURONTIN) capsule 300 mg, 300 mg, Oral, QHS, Sharl Ma, Gagan S, MD, 300 mg at 05/02/23 2231   guaiFENesin-dextromethorphan (ROBITUSSIN DM) 100-10 MG/5ML syrup 15 mL, 15 mL, Oral, Q4H PRN, Sharl Ma, Gagan S, MD, 15 mL at 05/01/23 1445   insulin aspart (novoLOG) injection 0-5 Units, 0-5 Units, Subcutaneous, QHS, [name], MD, 2 Units at 05/02/23 2229   insulin aspart (novoLOG) injection 0-6 Units,  0-6 Units, Subcutaneous, TID WC, [name], MD, 2 Units at 05/01/23 1935   insulin detemir (LEVEMIR) injection 4 Units, 4 Units, Subcutaneous, BID, Sharl Ma, Gagan S, MD, 4 Units at 05/03/23 1308   ipratropium-albuterol (DUONEB) 0.5-2.5 (3) MG/3ML nebulizer solution 3 mL, 3 mL, Nebulization, TID, Sharl Ma, Sarina Ill, MD, 3 mL at 05/03/23 0809   irbesartan (AVAPRO) tablet 150 mg, 150 mg, Oral, Daily, Sharl Ma, Gagan S, MD, 150 mg at 05/03/23 0940   loperamide (IMODIUM) capsule 2 mg, 2 mg, Oral, PRN, [name], MD, 2 mg at 05/01/23 2121   midodrine (PROAMATINE) tablet 5 mg, 5 mg, Oral, Once per day on Mon Wed Fri, Lama, Gagan S, MD, 5 mg at 05/02/23 6578   multivitamin (RENA-VIT) tablet 1 tablet, 1 tablet, Oral, QHS, Sharl Ma, Sarina Ill, MD, 1 tablet at 05/02/23 2231   ondansetron (ZOFRAN) tablet 4 mg, 4 mg, Oral, Q6H PRN, 4 mg at 04/23/23 2346 **OR** ondansetron (ZOFRAN) injection 4 mg, 4 mg, Intravenous, Q6H PRN, [name], MD, 4 mg at 04/27/23 0043   pantoprazole (PROTONIX) EC tablet 40 mg, 40 mg, Oral, Daily, Sharl Ma, Gagan S, MD, 40 mg at 05/03/23 0940   sevelamer carbonate (RENVELA) tablet 1,600 mg, 1,600 mg, Oral, TID WC, Sharl Ma, Gagan S, MD, 1,600 mg at 05/03/23 4696   traZODone (DESYREL) tablet 50 mg, 50 mg, Oral, Once per day on Mon Wed Fri, Lama, Gagan S, MD, 50 mg at 05/02/23 2231    ALLERGIES   Patient has no known allergies.     REVIEW OF SYSTEMS    Review of Systems:  Gen:  Denies  fever, sweats, chills weigh loss  HEENT: Denies blurred vision, double vision, ear pain, eye pain, hearing loss, nose bleeds, sore throat Cardiac:  No dizziness, chest pain or heaviness, chest tightness,edema Resp:   reports dyspnea chronically  Gi: Denies swallowing difficulty, stomach pain, nausea or vomiting, diarrhea, constipation, bowel incontinence Gu:  Denies bladder incontinence, burning urine Ext:   Denies Joint pain, stiffness or swelling Skin: Denies  skin rash, easy bruising or bleeding or  hives Endoc:  Denies polyuria, polydipsia , polyphagia or weight change Psych:   Denies depression, insomnia or hallucinations   Other:  All other systems negative   VS: BP (!) 103/52 (BP Location: Right Arm)   Pulse 67   Temp 98.1 F (36.7 C) (Oral)   Resp 18   Ht 5\' 3"  (1.6 m)  Wt 64.4 kg   SpO2 93%   BMI 25.15 kg/m      PHYSICAL EXAM    GENERAL:NAD, no fevers, chills, no weakness no fatigue HEAD: Normocephalic, atraumatic.  EYES: Pupils equal, round, reactive to light. Extraocular muscles intact. No scleral icterus.  MOUTH: Moist mucosal membrane. Dentition intact. No abscess noted.  EAR, NOSE, THROAT: Clear without exudates. No external lesions.  NECK: Supple. No thyromegaly. No nodules. No JVD.  PULMONARY: decreased breath sounds with mild rhonchi worse at bases bilaterally.  CARDIOVASCULAR: S1 and S2. Regular rate and rhythm. No murmurs, rubs, or gallops. No edema. Pedal pulses 2+ bilaterally.  GASTROINTESTINAL: Soft, nontender, nondistended. No masses. Positive bowel sounds. No hepatosplenomegaly.  MUSCULOSKELETAL: No swelling, clubbing, or edema. Range of motion full in all extremities.  NEUROLOGIC: Cranial nerves II through XII are intact. No gross focal neurological deficits. Sensation intact. Reflexes intact.  SKIN: No ulceration, lesions, rashes, or cyanosis. Skin warm and dry. Turgor intact.  PSYCHIATRIC: Mood, affect within normal limits. The patient is awake, alert and oriented x 3. Insight, judgment intact.       IMAGING   Narrative & Impression  CLINICAL DATA:  Atelectasis.   EXAM: PORTABLE CHEST 1 VIEW   COMPARISON:  Radiograph and CT 04/23/2023   FINDINGS: Tracheostomy tube tip at the thoracic inlet. Median sternotomy and prosthetic cardiac valve. Stable cardiomegaly. Improvement in right pleural effusion. Similar left pleural effusion and basilar opacity. Pulmonary edema is unchanged. No pneumothorax.   IMPRESSION: 1. Improvement in right  pleural effusion. 2. Unchanged left pleural effusion and basilar opacity. 3. Unchanged pulmonary edema.       -pulm edema and bilateral effusions worse on left - CXR 04/29/23 - independent review     ASSESSMENT/PLAN     Acute on chronic hypoxemic respiratory failure Status post chest x-ray-interval changes with improved right lower lobe however left hemithorax with interstitial edema and atelectasis. -CRP is within reference range suggestive of absence of inflammatory changes in low likelihood of infectious or inflammatory pneumonia, there is a very mild leukocytosis 11.3 which may be due to underlying GI cholera.    Bibasilar atelectasis -Due to compressive effects with surrounding pleural effusion as well as chronic deconditioning and interstitial edema with development of atelectatic segments.  Patient should start chronic physical therapy and Occupational Therapy with pulmonary rehab on outpatient basis, in the interim recruitment maneuvers with MetaNeb device utilizing albuterol as well as utilization of chest physiotherapy with incentive spirometry each hour.  continue inpatient physical and occupational therapy Patient is also on DuoNeb nebulizer therapy which should also help treat atelectasis.   Interstitial edema -Nephrology is on case patient is currently being dialyzed -Complex fluid management due to ESRD Strict I&O fluid restricted diet    Thank you for allowing me to participate in the care of this patient.   Patient/Family are satisfied with care plan and all questions have been answered.    Provider disclosure: Patient with at least one acute or chronic illness or injury that poses a threat to life or bodily function and is being managed actively during this encounter.  All of the below services have been performed independently by signing provider:  review of prior documentation from internal and or external health records.  Review of previous and current lab  results.  Interview and comprehensive assessment during patient visit today. Review of current and previous chest radiographs/CT scans. Discussion of management and test interpretation with health care team and patient/family.   This document  was prepared using Conservation officer, historic buildings and may include unintentional dictation errors.     Vida Rigger, M.D.  Division of Pulmonary & Critical Care Medicine

## 2023-05-03 NOTE — Progress Notes (Signed)
[facility]  - [place] [facility]   PATIENT NAME: Deborah [name]    MR#:  [id number]  DATE OF BIRTH:  03-05-1956  SUBJECTIVE:  patient sitting up in the chair. Not at all in any respiratory distress. Sats intramedullary drop in the 70s and she is placed back again on 10 L oxygen through trach collar. Patient has no distress. Sats, up spontaneously and couple seconds to a minute.  VITALS:  Blood pressure (!) 103/52, pulse 67, temperature 98.1 F (36.7 C), temperature source Oral, resp. rate 18, height 5\' 3"  (1.6 m), weight 64.4 kg, SpO2 93 %.  PHYSICAL EXAMINATION:   GENERAL:  67 y.o.-year-old patient with no acute distress.  LUNGS: Normal breath sounds bilaterally, no wheezing. Trach Collar+ CARDIOVASCULAR: S1, S2 normal. No murmur   ABDOMEN: Soft, nontender, nondistended. Bowel sounds present.  EXTREMITIES: No  edema b/l.   HD access+ NEUROLOGIC: nonfocal  patient is alert and awake SKIN: No obvious rash, lesion, or ulcer.   LABORATORY PANEL:  CBC Recent Labs  Lab 04/30/23 1000  WBC 11.3*  HGB 7.6*  HCT 23.0*  PLT 245     Chemistries  Recent Labs  Lab 05/02/23 0934  NA 138  K 4.3  CL 98  CO2 29  GLUCOSE 153*  BUN 45*  CREATININE 8.79*  CALCIUM 9.0    Assessment and Plan  Acute on chronic respiratory failure with hypoxia -Continue to require 10 L/min oxygen; usually less than 6 L/min at home -Chronic Tracheostomy status -CT chest showed vascular congestion/volume overload with acute on chronic diastolic heart failure -Nephrology consulted for dialysis -Swallow evaluation obtained, recommend regular consistency diet with thin liquids, aspiration precautions -Pulmonology consulted; started on inpatient physical and occupational therapy, and recommend outpatient PT/OT with pulmonary rehab.  Started on chest PT and incentive spirometry for bibasilar atelectasis.  -Chest x-ray obtained today shows unchanged left pleural effusion and basilar  opacity, unchanged pulmonary edema, improvement right pleural effusion -Lasix 80 mg IV daily ordered per pulmonology --d/w Dr Rubye Beach down oxygen to keep sats >89-90% -- discussed with respiratory therapist. Patient has asymptomatic and her sats intermittently drop in the 80s without any distress. Comes up in seconds to minute.   Community-acquired pneumonia -Had been on cefepime during previous hospital stay and was discharged on Levaquin; family never filled Levaquin prescription - cefepime has been restarted -Sputum culture also grew Corynebacterium species -ID was consulted;  2 d echo obtained to evaluate the functioning of the valve.  This revealed normal valves.  EF of 55-60.  Ventricular diastolic dysfunction  -Pseudomonas in -tracheal aspirate   --pt was treated for  pseudomonas resp infection with cefepime for 10 days-total.    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 Rivka Safer -She recommends to obtain stool culture and treat with azithromycin 1 g p.o. x 1.     ESRD -Patient on hemodialysis   Mitral valve replacement -2D echo showed normal functioning valve, EF 55 to 60%   Paroxysmal atrial fibrillation -Continue Eliquis, Coreg   Diabetes mellitus type 2 with ESRD -Continue Levemir, sliding scale insulin with NovoLog -CBG well-controlled   Hypertension -Blood pressure is stable -- amlodipine, Coreg,irbesartan --takes midodrine on HD days    Procedures: Family communication :none Consults : ID, pulmonary, nephrology CODE STATUS: full DVT Prophylaxis : eliquis Level of care: Progressive Status is: Inpatient Remains inpatient appropriate because:  respiratory failure. Trying to wean oxygen down after 6 L for patient to go home with    TOTAL TIME TAKING CARE OF THIS PATIENT: 35 minutes.  >50% time spent on counselling and  coordination of care  Note: This dictation was prepared with Dragon dictation along with smaller phrase technology. Any transcriptional errors that result from this process are unintentional.  Enedina Finner M.D    [facility]   CC: Primary care physician; Si Gaul, MD

## 2023-05-03 NOTE — Progress Notes (Signed)
[facility]  ROUNDING NOTE   Subjective:   [name] returns to [facility] on 04/23/2023 for Acute on chronic respiratory failure with hypoxia ([name]) [J96.21] Acute on chronic diastolic (congestive) heart failure ([name]) [I50.33]  Patient is known to our practice from previous admissions and receives outpatient dialysis at [facility] on a MWF schedule, supervised by [facility] physicians.  Patient seen sitting up in bed Remains on 10L  Reports intermittent shortness of breath  Objective:  Vital signs in last 24 hours:  Temp:  [98.1 F (36.7 C)-99.3 F (37.4 C)] 98.1 F (36.7 C) (05/30 1143) Pulse Rate:  [67-80] 67 (05/30 1143) Resp:  [12-22] 18 (05/30 1143) BP: (97-127)/(52-64) 103/52 (05/30 1143) SpO2:  [89 %-96 %] 93 % (05/30 0850) FiO2 (%):  [11 %-40 %] 40 % (05/30 1100) Weight:  [63.2 kg-64.4 kg] 64.4 kg (05/30 0823)  Weight change:  Filed Weights   05/02/23 1050 05/02/23 1452 05/03/23 0823  Weight: 64.2 kg 63.2 kg 64.4 kg    Intake/Output: I/O last 3 completed shifts: In: 240 [I.V.:240] Out: 1751 [Urine:250; Other:1500; Stool:1]   Intake/Output this shift:  No intake/output data recorded.  Physical Exam: General: NAD  Head: Normocephalic, atraumatic. Moist oral mucosal membranes  Eyes: Anicteric  Neck: +tracheostomy  Lungs:  Diminished in bases  O2 Trach  Heart: Regular rate and rhythm  Abdomen:  Soft, nontender  Extremities: No peripheral edema.  Neurologic: Nonfocal, moving all four extremities  Skin: No lesions  Access: Left AVG    Basic Metabolic Panel: Recent Labs  Lab 04/27/23 0416 04/28/23 0419 04/30/23 1000 05/02/23 0934  NA 134* 137 135 138  K 4.4 4.2 3.8 4.3  CL 97* 98 98 98  CO2 28 29 24 29   GLUCOSE 167* 113* 150* 153*  BUN 43* 22 51* 45*  CREATININE 8.10* 5.51* 10.47* 8.79*  CALCIUM 8.4* 8.6* 8.7* 9.0  PHOS  --   --  4.3 4.6     Liver Function Tests: Recent Labs  Lab 04/30/23 1000 05/02/23 0934  ALBUMIN  2.9* 3.0*    No results for input(s): "LIPASE", "AMYLASE" in the last 168 hours. No results for input(s): "AMMONIA" in the last 168 hours.  CBC: Recent Labs  Lab 04/27/23 0416 04/28/23 0419 04/30/23 1000  WBC 11.2* 10.5 11.3*  HGB 7.4* 7.3* 7.6*  HCT 22.2* 22.2* 23.0*  MCV 91.4 91.7 92.0  PLT 211 186 245     Cardiac Enzymes: No results for input(s): "CKTOTAL", "CKMB", "CKMBINDEX", "TROPONINI" in the last 168 hours.  BNP: Invalid input(s): "POCBNP"  CBG: Recent Labs  Lab 05/02/23 0752 05/02/23 1530 05/02/23 2149 05/03/23 0831 05/03/23 1246  GLUCAP 96 94 216* 114* 202*     Microbiology: Results for orders placed or performed during the hospital encounter of 04/23/23  Organism ID, Bacteria     Status: None (Preliminary result)   Collection Time: 04/18/23  8:20 PM  Result Value Ref Range Status   Organism ID, Bacteria REFERT  Final    Comment: (NOTE) Please refer to the following specimen for additional lab results. Please refer to spec # [phone] for test result. Camelia Moraru notified 05-02-2023 at 13:00 Performed At: [facility] [address] [place], [place] [id number] Jolene Schimke MD GN:[id number]    Source of Sample PENDING  Incomplete  Bacterial organism reflex     Status: None   Collection Time: 04/18/23  8:20 PM  Result Value Ref Range Status   Bacterial result 1 Comment  Final  Comment: (NOTE) Specimen has been received and testing has been initiated. Performed At: [facility] [address] [place], [place] [id number] Jolene Schimke MD WU:[id number]   SARS Coronavirus 2 by RT PCR (hospital order, performed in [facility] hospital lab) *cepheid single result test* Anterior Nasal Swab     Status: None   Collection Time: 04/23/23  9:30 AM   Specimen: Anterior Nasal Swab  Result Value Ref Range Status   SARS Coronavirus 2 by RT PCR NEGATIVE NEGATIVE Final    Comment: (NOTE) SARS-CoV-2 target nucleic acids are NOT  DETECTED.  The SARS-CoV-2 RNA is generally detectable in upper and lower respiratory specimens during the acute phase of infection. The lowest concentration of SARS-CoV-2 viral copies this assay can detect is 250 copies / mL. A negative result does not preclude SARS-CoV-2 infection and should not be used as the sole basis for treatment or other patient management decisions.  A negative result may occur with improper specimen collection / handling, submission of specimen other than nasopharyngeal swab, presence of viral mutation(s) within the areas targeted by this assay, and inadequate number of viral copies (<250 copies / mL). A negative result must be combined with clinical observations, patient history, and epidemiological information.  Fact Sheet for Patients:   RoadLapTop.co.za  Fact Sheet for Healthcare Providers: http://kim-miller.com/  This test is not yet approved or  cleared by the [place] FDA and has been authorized for detection and/or diagnosis of SARS-CoV-2 by FDA under an Emergency Use Authorization (EUA).  This EUA will remain in effect (meaning this test can be used) for the duration of the COVID-19 declaration under Section 564(b)(1) of the Act, 21 U.S.C. section 360bbb-3(b)(1), unless the authorization is terminated or revoked sooner.  Performed at [facility], [address] [place], [place] [address]   Culture, blood (Routine X 2) w Reflex to ID Panel     Status: None   Collection Time: 04/24/23  7:52 PM   Specimen: BLOOD  Result Value Ref Range Status   Specimen Description BLOOD RIGHT ANTECUBITAL  Final   Special Requests   Final    BOTTLES DRAWN AEROBIC AND ANAEROBIC Blood Culture adequate volume   Culture   Final    NO GROWTH 5 DAYS Performed at [facility], [address] [place], [place] [address]    Report Status 04/29/2023 FINAL  Final  Culture, blood (Routine X 2) w  Reflex to ID Panel     Status: None   Collection Time: 04/24/23  7:56 PM   Specimen: BLOOD  Result Value Ref Range Status   Specimen Description BLOOD RIGHT ANTECUBITAL  Final   Special Requests   Final    BOTTLES DRAWN AEROBIC AND ANAEROBIC Blood Culture adequate volume   Culture   Final    NO GROWTH 5 DAYS Performed at [facility], [address] [place], [place] [address]    Report Status 04/29/2023 FINAL  Final  Respiratory (~20 pathogens) panel by PCR     Status: None   Collection Time: 04/25/23  2:01 PM   Specimen: Nasopharyngeal Swab; Respiratory  Result Value Ref Range Status   Adenovirus NOT DETECTED NOT DETECTED Final   Coronavirus 229E NOT DETECTED NOT DETECTED Final    Comment: (NOTE) The Coronavirus on the Respiratory Panel, DOES NOT test for the novel  Coronavirus (2019 nCoV)    Coronavirus HKU1 NOT DETECTED NOT DETECTED Final   Coronavirus NL63 NOT DETECTED NOT DETECTED Final   Coronavirus  OC43 NOT DETECTED NOT DETECTED Final   Metapneumovirus NOT DETECTED NOT DETECTED Final   Rhinovirus / Enterovirus NOT DETECTED NOT DETECTED Final   Influenza A NOT DETECTED NOT DETECTED Final   Influenza B NOT DETECTED NOT DETECTED Final   Parainfluenza Virus 1 NOT DETECTED NOT DETECTED Final   Parainfluenza Virus 2 NOT DETECTED NOT DETECTED Final   Parainfluenza Virus 3 NOT DETECTED NOT DETECTED Final   Parainfluenza Virus 4 NOT DETECTED NOT DETECTED Final   Respiratory Syncytial Virus NOT DETECTED NOT DETECTED Final   Bordetella pertussis NOT DETECTED NOT DETECTED Final   Bordetella Parapertussis NOT DETECTED NOT DETECTED Final   Chlamydophila pneumoniae NOT DETECTED NOT DETECTED Final   Mycoplasma pneumoniae NOT DETECTED NOT DETECTED Final    Comment: Performed at [facility], [address], [place], [place] [address]  Gastrointestinal Panel by PCR , Stool     Status: Abnormal   Collection Time: 04/26/23  6:56 AM   Specimen: Stool  Result Value Ref  Range Status   Campylobacter species NOT DETECTED NOT DETECTED Final   Plesimonas shigelloides NOT DETECTED NOT DETECTED Final   Salmonella species NOT DETECTED NOT DETECTED Final   Yersinia enterocolitica NOT DETECTED NOT DETECTED Final   Vibrio species DETECTED (A) NOT DETECTED Final    Comment: RESULT CALLED TO, READ BACK BY AND VERIFIED WITH: JOSEPH HAYES AT 5284 04/29/23.PMF    Vibrio cholerae DETECTED (A) NOT DETECTED Final    Comment: RESULT CALLED TO, READ BACK BY AND VERIFIED WITH: JOSEPH HAYES AT 1324 04/29/23.PMF    Enteroaggregative E coli (EAEC) NOT DETECTED NOT DETECTED Final   Enteropathogenic E coli (EPEC) NOT DETECTED NOT DETECTED Final   Enterotoxigenic E coli (ETEC) NOT DETECTED NOT DETECTED Final   Shiga like toxin producing E coli (STEC) NOT DETECTED NOT DETECTED Final   Shigella/Enteroinvasive E coli (EIEC) NOT DETECTED NOT DETECTED Final   Cryptosporidium NOT DETECTED NOT DETECTED Final   Cyclospora cayetanensis NOT DETECTED NOT DETECTED Final   Entamoeba histolytica NOT DETECTED NOT DETECTED Final   Giardia lamblia NOT DETECTED NOT DETECTED Final   Adenovirus F40/41 NOT DETECTED NOT DETECTED Final   Astrovirus NOT DETECTED NOT DETECTED Final   Norovirus GI/GII NOT DETECTED NOT DETECTED Final   Rotavirus A NOT DETECTED NOT DETECTED Final   Sapovirus (I, II, IV, and V) NOT DETECTED NOT DETECTED Final    Comment: Performed at [facility], [address] [place], [place] [address]    Coagulation Studies: No results for input(s): "LABPROT", "INR" in the last 72 hours.   Urinalysis: No results for input(s): "COLORURINE", "LABSPEC", "PHURINE", "GLUCOSEU", "HGBUR", "BILIRUBINUR", "KETONESUR", "PROTEINUR", "UROBILINOGEN", "NITRITE", "LEUKOCYTESUR" in the last 72 hours.  Invalid input(s): "APPERANCEUR"    Imaging: DG Chest Port 1 View  Result Date: 05/02/2023 CLINICAL DATA:  Follow-up atelectasis EXAM: PORTABLE CHEST 1 VIEW COMPARISON:  04/29/2023  FINDINGS: Cardiac shadow is enlarged but stable. Postsurgical changes are noted. Tracheostomy tube is noted in satisfactory position. Persistent left-sided effusion and basilar opacity is noted. Central vascular congestion is noted with changes of pulmonary edema similar to that noted on the prior exam. No new focal abnormality is seen. IMPRESSION: Stable appearance of the chest when compared with the previous exam. Electronically Signed   By: Alcide Clever M.D.   On: 05/02/2023 23:01     Medications:    sodium chloride 5 mL/hr at 05/02/23 0600     apixaban  2.5 mg Oral BID   aspirin  EC  81 mg Oral Daily   atorvastatin  80 mg Oral q1800   [START ON 05/04/2023] carvedilol  6.25 mg Oral BID WC   Chlorhexidine Gluconate Cloth  6 each Topical Q0600   epoetin (EPOGEN/PROCRIT) injection  10,000 Units Intravenous Q M,W,F-HD   escitalopram  10 mg Oral Daily   gabapentin  300 mg Oral QHS   insulin aspart  0-5 Units Subcutaneous QHS   insulin aspart  0-6 Units Subcutaneous TID WC   insulin detemir  4 Units Subcutaneous BID   ipratropium-albuterol  3 mL Nebulization TID   irbesartan  150 mg Oral Daily   midodrine  5 mg Oral Once per day on Mon Wed Fri   multivitamin  1 tablet Oral QHS   pantoprazole  40 mg Oral Daily   sevelamer carbonate  1,600 mg Oral TID WC   traZODone  50 mg Oral Once per day on Mon Wed Fri   sodium chloride, acetaminophen **OR** acetaminophen, guaiFENesin-dextromethorphan, loperamide, ondansetron **OR** ondansetron (ZOFRAN) IV  Assessment/ Plan:  [name] is a 67 y.o.  female with end stage renal disease on hemodialysis, tracheostomy, hypertension, hyperlipidemia, diabetes mellitus type II insulin dependent, depression, and diabetic neuropathy who presents to [facility] for 04/23/2023 for Acute on chronic respiratory failure with hypoxia ([name]) [J96.21] Acute on chronic diastolic (congestive) heart failure ([name]) [I50.33]  [facility] MWF [address] Left AVG  62kg  End Stage Kidney Disease:  - Next treatment scheduled for Friday  Hypertension with chronic kidney disease: current regimen of irbesartan, carvedilol and amlodipine. Home regimen of olmesartan instead of irbesartan. Also prescribed Midodrine to prevent hypotension during dialysis.   -Blood pressure 103/52, soft today  Diabetes mellitus type II with chronic kidney disease: insulin dependent.  Sliding scale insulin per primary team.  Secondary Hyperparathyroidism -Bone minerals acceptable - Continue sevelamer with meals  Anemia of chronic kidney disease: -Continue EPO with dialysis   LOS: 9   5/30/20241:29 PM

## 2023-05-03 NOTE — TOC Progression Note (Signed)
Transition of Care [facility]) - Progression Note    Patient Details  Name: Deborah [name] MRN: [id number] Date of Birth: May 29, 1956  Transition of Care [facility]) CM/SW Contact  Margarito Liner, LCSW Phone Number: 05/03/2023, 10:18 AM  Clinical Narrative: CSW called [employer] [phone]). Patient's current home oxygen only goes up to 5 L. If patient needs more oxygen at discharge, will need sats test completed on 4 L and she will have to desat to 88% or less. Will need sats test and DME order faxed to [phone].    Expected Discharge Plan: Home w Home Health Services Barriers to Discharge: Continued Medical Work up  Expected Discharge Plan and Services     Post Acute Care Choice: Resumption of Svcs/PTA Provider Living arrangements for the past 2 months: Apartment                                       Social Determinants of Health (SDOH) Interventions SDOH Screenings   Food Insecurity: No Food Insecurity (04/24/2023)  Housing: Patient Declined (04/24/2023)  Transportation Needs: No Transportation Needs (04/24/2023)  Utilities: Not At Risk (04/24/2023)  Tobacco Use: Low Risk  (04/24/2023)    Readmission Risk Interventions    04/27/2023    4:18 PM 11/04/2020    9:20 AM  Readmission Risk Prevention Plan  Transportation Screening Complete Complete  PCP or Specialist Appt within 3-5 Days Complete   HRI or Home Care Consult Complete   Social Work Consult for Recovery Care Planning/Counseling Complete   Palliative Care Screening Not Applicable   Medication Review Oceanographer) Complete   PCP or Specialist appointment within 3-5 days of discharge  Complete  SW Recovery Care/Counseling Consult  Complete  Palliative Care Screening  Not Applicable  Skilled Nursing Facility  Not Applicable

## 2023-05-04 DIAGNOSIS — J9621 Acute and chronic respiratory failure with hypoxia: Secondary | ICD-10-CM | POA: Diagnosis not present

## 2023-05-04 LAB — RENAL FUNCTION PANEL
Albumin: 3 g/dL — ABNORMAL LOW (ref 3.5–5.0)
Anion gap: 12 (ref 5–15)
BUN: 42 mg/dL — ABNORMAL HIGH (ref 8–23)
CO2: 27 mmol/L (ref 22–32)
Calcium: 8.7 mg/dL — ABNORMAL LOW (ref 8.9–10.3)
Chloride: 95 mmol/L — ABNORMAL LOW (ref 98–111)
Creatinine, Ser: 8.69 mg/dL — ABNORMAL HIGH (ref 0.44–1.00)
GFR, Estimated: 5 mL/min — ABNORMAL LOW (ref >60)
Glucose, Bld: 237 mg/dL — ABNORMAL HIGH (ref 70–99)
Phosphorus: 5.2 mg/dL — ABNORMAL HIGH (ref 2.5–4.6)
Potassium: 4.6 mmol/L (ref 3.5–5.1)
Sodium: 134 mmol/L — ABNORMAL LOW (ref 135–145)

## 2023-05-04 LAB — GLUCOSE, CAPILLARY
Glucose-Capillary: 124 mg/dL — ABNORMAL HIGH (ref 70–99)
Glucose-Capillary: 194 mg/dL — ABNORMAL HIGH (ref 70–99)
Glucose-Capillary: 279 mg/dL — ABNORMAL HIGH (ref 70–99)

## 2023-05-04 LAB — CBC
HCT: 24.4 % — ABNORMAL LOW (ref 36.0–46.0)
Hemoglobin: 7.8 g/dL — ABNORMAL LOW (ref 12.0–15.0)
MCH: 30.5 pg (ref 26.0–34.0)
MCHC: 32 g/dL (ref 30.0–36.0)
MCV: 95.3 fL (ref 80.0–100.0)
Platelets: 316 10*3/uL (ref 150–400)
RBC: 2.56 MIL/uL — ABNORMAL LOW (ref 3.87–5.11)
RDW: 17.9 % — ABNORMAL HIGH (ref 11.5–15.5)
WBC: 9.1 10*3/uL (ref 4.0–10.5)
nRBC: 0 % (ref 0.0–0.2)

## 2023-05-04 LAB — STOOL CULTURE REFLEX - RSASHR

## 2023-05-04 MED ORDER — EPOETIN ALFA 10000 UNIT/ML IJ SOLN
INTRAMUSCULAR | Status: AC
  Filled 2023-05-04: qty 1

## 2023-05-04 MED ORDER — PENTAFLUOROPROP-TETRAFLUOROETH EX AERO
INHALATION_SPRAY | CUTANEOUS | Status: AC
  Filled 2023-05-04: qty 30

## 2023-05-04 NOTE — Progress Notes (Signed)
Pt placed in transport at 13:11.  Pt brought into dialysis unit at 15:50.

## 2023-05-04 NOTE — Plan of Care (Signed)

## 2023-05-04 NOTE — Progress Notes (Signed)
Mobility Specialist - Progress Note   05/04/23 1500  Mobility  Activity Transferred from chair to bed;Ambulated with assistance in room  Level of Assistance Standby assist, set-up cues, supervision of patient - no hands on  Assistive Device Front wheel walker  Distance Ambulated (ft) 5 ft  Activity Response Tolerated well  $Mobility charge 1 Mobility     Pt transferred chair-bed with supervision. Alarm set, needs in reach.    Filiberto Pinks Mobility Specialist 05/04/23, 3:47 PM

## 2023-05-04 NOTE — Progress Notes (Signed)
Physical Therapy Treatment Patient Details Name: Deborah [name] MRN: [id number] DOB: 30-Nov-1956 Today's Date: 05/04/2023   History of Present Illness 67 year old African-American female history of end-stage renal disease on dialysis Monday Wednesday Friday, paroxysmal atrial fibrillation on Eliquis, history of tracheostomy, chronic diastolic heart failure, chronic hypoxia at night, uncontrolled type 2 diabetes, hypertension, presents the ER today with worsening cough and hypoxia.  Patient was recently discharged the hospital yesterday after spending 5 days in the hospital with pneumonia. Workup for fluid overload, CHF exacerbation.    PT Comments    Patient alert, agreeable to PT, denied pain. The patient demonstrated great progress towards goals today. Able to perform bed mobility and transfers with supervision and RW, and ambulate ~332ft total. 1 seated rest break, no LOB noted and pt on 10L via trach collar throughout with spO2 >92%. Returned to room with needs in reach. The patient would benefit from further skilled PT intervention to continue to progress towards goals.     Recommendations for follow up therapy are one component of a multi-disciplinary discharge planning process, led by the attending physician.  Recommendations may be updated based on patient status, additional functional criteria and insurance authorization.  Follow Up Recommendations       Assistance Recommended at Discharge Intermittent Supervision/Assistance  Patient can return home with the following Assistance with cooking/housework;Assist for transportation;Help with stairs or ramp for entrance;A little help with walking and/or transfers   Equipment Recommendations  None recommended by PT    Recommendations for Other Services       Precautions / Restrictions Precautions Precautions: Fall Precaution Comments: try to keep O2 at or above 88-90% Restrictions Weight Bearing Restrictions: No     Mobility  Bed  Mobility Overal bed mobility: Needs Assistance Bed Mobility: Supine to Sit     Supine to sit: Supervision          Transfers Overall transfer level: Needs assistance Equipment used: Rolling walker (2 wheels) Transfers: Sit to/from Stand Sit to Stand: Supervision                Ambulation/Gait   Gait Distance (Feet):  (70ft, 153ft) Assistive device: Rolling walker (2 wheels)         General Gait Details: 1 seated rest break   Stairs             Wheelchair Mobility    Modified Rankin (Stroke Patients Only)       Balance Overall balance assessment: Needs assistance Sitting-balance support: No upper extremity supported, Feet supported Sitting balance-Leahy Scale: Fair     Standing balance support: Single extremity supported, Bilateral upper extremity supported, During functional activity, Reliant on assistive device for balance Standing balance-Leahy Scale: Fair                              Cognition Arousal/Alertness: Awake/alert Behavior During Therapy: WFL for tasks assessed/performed Overall Cognitive Status: Within Functional Limits for tasks assessed                                          Exercises      General Comments General comments (skin integrity, edema, etc.): spO2 > 88% throughout session      Pertinent Vitals/Pain Pain Assessment Pain Assessment: No/denies pain    Home Living  Prior Function            PT Goals (current goals can now be found in the care plan section) Progress towards PT goals: Progressing toward goals    Frequency    Min 2X/week      PT Plan Current plan remains appropriate    Co-evaluation              AM-PAC PT "6 Clicks" Mobility   Outcome Measure  Help needed turning from your back to your side while in a flat bed without using bedrails?: A Little Help needed moving from lying on your back to sitting on the side  of a flat bed without using bedrails?: A Little Help needed moving to and from a bed to a chair (including a wheelchair)?: None Help needed standing up from a chair using your arms (e.g., wheelchair or bedside chair)?: None Help needed to walk in hospital room?: None Help needed climbing 3-5 steps with a railing? : A Little 6 Click Score: 21    End of Session Equipment Utilized During Treatment: Oxygen;Other (comment) Activity Tolerance: Patient tolerated treatment well Patient left: in chair;with call bell/phone within reach Nurse Communication: Mobility status PT Visit Diagnosis: Other abnormalities of gait and mobility (R26.89);Muscle weakness (generalized) (M62.81)     Time: 1610-9604 PT Time Calculation (min) (ACUTE ONLY): 24 min  Charges:  $Therapeutic Exercise: 23-37 mins                     Olga Coaster PT, DPT 1:54 PM,05/04/23

## 2023-05-04 NOTE — Progress Notes (Signed)
[facility]  - [facility]   PATIENT NAME: Deborah [name]    MR#:  [id number]  DATE OF BIRTH:  June 12, 1956  SUBJECTIVE:  patient sitting up in the chair. Patient remains on 35% FIR to 10 L nasal cannula oxygen. Respiratory therapist working to wean her down. VITALS:  Blood pressure (!) 115/57, pulse 65, temperature 98.5 F (36.9 C), resp. rate 18, height 5\' 3"  (1.6 m), weight 64.4 kg, SpO2 98 %.  PHYSICAL EXAMINATION:   GENERAL:  67 y.o.-year-old patient with no acute distress.  LUNGS: Normal breath sounds bilaterally, no wheezing. Trach Collar+ CARDIOVASCULAR: S1, S2 normal. No murmur   ABDOMEN: Soft, nontender, nondistended. Bowel sounds present.  EXTREMITIES: No  edema b/l.   HD access+ NEUROLOGIC: nonfocal  patient is alert and awake  LABORATORY PANEL:  CBC Recent Labs  Lab 04/30/23 1000  WBC 11.3*  HGB 7.6*  HCT 23.0*  PLT 245     Chemistries  Recent Labs  Lab 05/02/23 0934  NA 138  K 4.3  CL 98  CO2 29  GLUCOSE 153*  BUN 45*  CREATININE 8.79*  CALCIUM 9.0    Assessment and Plan  Acute on chronic respiratory failure with hypoxia -Continue to require 10 L/min oxygen; usually less than 6 L/min at home -Chronic Tracheostomy status -CT chest showed vascular congestion/volume overload with acute on chronic diastolic heart failure -Nephrology consulted for dialysis -Swallow evaluation obtained, recommend regular consistency diet with thin liquids, aspiration precautions -Pulmonology consulted; started on inpatient physical and occupational therapy, and recommend outpatient PT/OT with pulmonary rehab.  Started on chest PT and incentive spirometry for bibasilar atelectasis.  -Chest x-ray obtained today shows unchanged left pleural effusion and basilar opacity, unchanged pulmonary edema, improvement right pleural effusion -Lasix 80 mg IV daily ordered per pulmonology --d/w Dr Rubye Beach down oxygen to keep sats >89-90% -- discussed  with respiratory therapist. Patient has asymptomatic and her sats intermittently drop in the 80s without any distress. Comes up in few seconds to minute.  --cont metaneb  Community-acquired pneumonia -Had been on cefepime during previous hospital stay and was discharged on Levaquin; family never filled Levaquin prescription - cefepime has been restarted -Sputum culture also grew Corynebacterium species -ID was consulted;  2 d echo obtained to evaluate the functioning of the valve.  This revealed normal valves.  EF of 55-60.  Ventricular diastolic dysfunction  -Pseudomonas in -tracheal aspirate   --pt was treated for  pseudomonas resp infection with cefepime for 10 days-total.    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 Rivka Safer -She recommends to obtain stool culture and treat with azithromycin 1 g p.o. x 1.     ESRD -Patient on hemodialysis   Mitral valve replacement -2D echo showed normal functioning valve, EF 55 to 60%   Paroxysmal atrial fibrillation -Continue Eliquis, Coreg   Diabetes mellitus type 2 with ESRD -Continue Levemir, sliding scale insulin with NovoLog -CBG well-controlled   Hypertension -Blood pressure is stable -- amlodipine, Coreg,irbesartan --takes midodrine on HD days    Procedures: Family communication :none Consults : ID, pulmonary, nephrology CODE STATUS: full DVT Prophylaxis : eliquis Level of care: Progressive Status is: Inpatient Remains inpatient appropriate because: respiratory failure. Trying to wean oxygen down after 6 L for patient to go home with  Slow recovery!  TOTAL TIME TAKING CARE OF THIS PATIENT: 35 minutes.  >  50% time spent on counselling and coordination of care  Note: This dictation was prepared with Dragon dictation along with smaller phrase technology. Any transcriptional errors that result from  this process are unintentional.  Enedina Finner M.D    [facility]   CC: Primary care physician; Si Gaul, MD

## 2023-05-04 NOTE — TOC Progression Note (Addendum)
Transition of Care [facility]) - Progression Note    Patient Details  Name: HAARIKA COMBEE MRN: [id number] Date of Birth: 06-28-1956  Transition of Care [facility]) CM/SW Contact  Margarito Liner, LCSW Phone Number: 05/04/2023, 11:30 AM  Clinical Narrative:  Patient is agreeable to home health therapy recommendations. Starting search.   11:59 am: [facility] has accepted referral for PT and OT.  Expected Discharge Plan: Home w Home Health Services Barriers to Discharge: Continued Medical Work up  Expected Discharge Plan and Services     Post Acute Care Choice: Resumption of Svcs/PTA Provider Living arrangements for the past 2 months: Apartment                                       Social Determinants of Health (SDOH) Interventions SDOH Screenings   Food Insecurity: No Food Insecurity (04/24/2023)  Housing: Patient Declined (04/24/2023)  Transportation Needs: No Transportation Needs (04/24/2023)  Utilities: Not At Risk (04/24/2023)  Tobacco Use: Low Risk  (04/24/2023)    Readmission Risk Interventions    04/27/2023    4:18 PM 11/04/2020    9:20 AM  Readmission Risk Prevention Plan  Transportation Screening Complete Complete  PCP or Specialist Appt within 3-5 Days Complete   HRI or Home Care Consult Complete   Social Work Consult for Recovery Care Planning/Counseling Complete   Palliative Care Screening Not Applicable   Medication Review Oceanographer) Complete   PCP or Specialist appointment within 3-5 days of discharge  Complete  SW Recovery Care/Counseling Consult  Complete  Palliative Care Screening  Not Applicable  Skilled Nursing Facility  Not Applicable

## 2023-05-04 NOTE — Progress Notes (Signed)
[facility]  ROUNDING NOTE   Subjective:   [name] returns to [facility] on 04/23/2023 for Acute on chronic respiratory failure with hypoxia ([name]) [J96.21] Acute on chronic diastolic (congestive) heart failure ([name]) [I50.33]  Patient is known to our practice from previous admissions and receives outpatient dialysis at [facility] on a MWF schedule, supervised by [facility] physicians.  Patient seen resting quietly in bed Scheduled to receive dialysis later today  Patient required 12 L, 40%, trach collar overnight for shortness of breath Patient now receiving 10 L, 35%  Objective:  Vital signs in last 24 hours:  Temp:  [98 F (36.7 C)-98.6 F (37 C)] 98.5 F (36.9 C) (05/31 1234) Pulse Rate:  [60-67] 65 (05/31 1234) Resp:  [18-20] 18 (05/31 1234) BP: (97-115)/(48-63) 115/57 (05/31 1234) SpO2:  [85 %-100 %] 95 % (05/31 1310) FiO2 (%):  [30 %-40 %] 35 % (05/31 1310)  Weight change: 0.2 kg Filed Weights   05/02/23 1050 05/02/23 1452 05/03/23 0823  Weight: 64.2 kg 63.2 kg 64.4 kg    Intake/Output: No intake/output data recorded.   Intake/Output this shift:  No intake/output data recorded.  Physical Exam: General: NAD  Head: Normocephalic, atraumatic. Moist oral mucosal membranes  Eyes: Anicteric  Neck: +tracheostomy  Lungs:  Diminished in bases  O2 Trach  Heart: Regular rate and rhythm  Abdomen:  Soft, nontender  Extremities: No peripheral edema.  Neurologic: Nonfocal, moving all four extremities  Skin: No lesions  Access: Left AVG    Basic Metabolic Panel: Recent Labs  Lab 04/28/23 0419 04/30/23 1000 05/02/23 0934  NA 137 135 138  K 4.2 3.8 4.3  CL 98 98 98  CO2 29 24 29   GLUCOSE 113* 150* 153*  BUN 22 51* 45*  CREATININE 5.51* 10.47* 8.79*  CALCIUM 8.6* 8.7* 9.0  PHOS  --  4.3 4.6     Liver Function Tests: Recent Labs  Lab 04/30/23 1000 05/02/23 0934  ALBUMIN 2.9* 3.0*    No results for input(s): "LIPASE", "AMYLASE" in  the last 168 hours. No results for input(s): "AMMONIA" in the last 168 hours.  CBC: Recent Labs  Lab 04/28/23 0419 04/30/23 1000  WBC 10.5 11.3*  HGB 7.3* 7.6*  HCT 22.2* 23.0*  MCV 91.7 92.0  PLT 186 245     Cardiac Enzymes: No results for input(s): "CKTOTAL", "CKMB", "CKMBINDEX", "TROPONINI" in the last 168 hours.  BNP: Invalid input(s): "POCBNP"  CBG: Recent Labs  Lab 05/03/23 1246 05/03/23 1530 05/03/23 2133 05/04/23 0750 05/04/23 1237  GLUCAP 202* 231* 197* 124* 194*     Microbiology: Results for orders placed or performed during the hospital encounter of 04/23/23  Organism ID, Bacteria     Status: None (Preliminary result)   Collection Time: 04/18/23  8:20 PM  Result Value Ref Range Status   Organism ID, Bacteria REFERT  Final    Comment: (NOTE) Please refer to the following specimen for additional lab results. Please refer to spec # [id number] for test result. Camelia Moraru notified 05-02-2023 at 13:00 Performed At: [facility] [address] [place], [place] [id number] Jolene Schimke MD MV:[id number]    Source of Sample PENDING  Incomplete  Bacterial organism reflex     Status: None   Collection Time: 04/18/23  8:20 PM  Result Value Ref Range Status   Bacterial result 1 Comment  Final    Comment: (NOTE) Specimen has been received and testing has been initiated. Performed At: [facility] [employer] [address]  [address] [place], [place] [id number] Jolene Schimke MD WU:[id number]   SARS Coronavirus 2 by RT PCR (hospital order, performed in [facility] hospital lab) *cepheid single result test* Anterior Nasal Swab     Status: None   Collection Time: 04/23/23  9:30 AM   Specimen: Anterior Nasal Swab  Result Value Ref Range Status   SARS Coronavirus 2 by RT PCR NEGATIVE NEGATIVE Final    Comment: (NOTE) SARS-CoV-2 target nucleic acids are NOT DETECTED.  The SARS-CoV-2 RNA is generally detectable in upper and lower respiratory specimens during  the acute phase of infection. The lowest concentration of SARS-CoV-2 viral copies this assay can detect is 250 copies / mL. A negative result does not preclude SARS-CoV-2 infection and should not be used as the sole basis for treatment or other patient management decisions.  A negative result may occur with improper specimen collection / handling, submission of specimen other than nasopharyngeal swab, presence of viral mutation(s) within the areas targeted by this assay, and inadequate number of viral copies (<250 copies / mL). A negative result must be combined with clinical observations, patient history, and epidemiological information.  Fact Sheet for Patients:   RoadLapTop.co.za  Fact Sheet for Healthcare Providers: http://kim-miller.com/  This test is not yet approved or  cleared by the [place] FDA and has been authorized for detection and/or diagnosis of SARS-CoV-2 by FDA under an Emergency Use Authorization (EUA).  This EUA will remain in effect (meaning this test can be used) for the duration of the COVID-19 declaration under Section 564(b)(1) of the Act, 21 U.S.C. section 360bbb-3(b)(1), unless the authorization is terminated or revoked sooner.  Performed at [facility], [address] [place], [place] [address]   Culture, blood (Routine X 2) w Reflex to ID Panel     Status: None   Collection Time: 04/24/23  7:52 PM   Specimen: BLOOD  Result Value Ref Range Status   Specimen Description BLOOD RIGHT ANTECUBITAL  Final   Special Requests   Final    BOTTLES DRAWN AEROBIC AND ANAEROBIC Blood Culture adequate volume   Culture   Final    NO GROWTH 5 DAYS Performed at [facility], [address] [place], [place] [address]    Report Status 04/29/2023 FINAL  Final  Culture, blood (Routine X 2) w Reflex to ID Panel     Status: None   Collection Time: 04/24/23  7:56 PM   Specimen: BLOOD  Result Value  Ref Range Status   Specimen Description BLOOD RIGHT ANTECUBITAL  Final   Special Requests   Final    BOTTLES DRAWN AEROBIC AND ANAEROBIC Blood Culture adequate volume   Culture   Final    NO GROWTH 5 DAYS Performed at [facility], [address] [place], [place] [address]    Report Status 04/29/2023 FINAL  Final  Respiratory (~20 pathogens) panel by PCR     Status: None   Collection Time: 04/25/23  2:01 PM   Specimen: Nasopharyngeal Swab; Respiratory  Result Value Ref Range Status   Adenovirus NOT DETECTED NOT DETECTED Final   Coronavirus 229E NOT DETECTED NOT DETECTED Final    Comment: (NOTE) The Coronavirus on the Respiratory Panel, DOES NOT test for the novel  Coronavirus (2019 nCoV)    Coronavirus HKU1 NOT DETECTED NOT DETECTED Final   Coronavirus NL63 NOT DETECTED NOT DETECTED Final   Coronavirus OC43 NOT DETECTED NOT DETECTED Final   Metapneumovirus NOT DETECTED NOT DETECTED Final   Rhinovirus /  Enterovirus NOT DETECTED NOT DETECTED Final   Influenza A NOT DETECTED NOT DETECTED Final   Influenza B NOT DETECTED NOT DETECTED Final   Parainfluenza Virus 1 NOT DETECTED NOT DETECTED Final   Parainfluenza Virus 2 NOT DETECTED NOT DETECTED Final   Parainfluenza Virus 3 NOT DETECTED NOT DETECTED Final   Parainfluenza Virus 4 NOT DETECTED NOT DETECTED Final   Respiratory Syncytial Virus NOT DETECTED NOT DETECTED Final   Bordetella pertussis NOT DETECTED NOT DETECTED Final   Bordetella Parapertussis NOT DETECTED NOT DETECTED Final   Chlamydophila pneumoniae NOT DETECTED NOT DETECTED Final   Mycoplasma pneumoniae NOT DETECTED NOT DETECTED Final    Comment: Performed at [facility], [address], [place], [place] [address]  Gastrointestinal Panel by PCR , Stool     Status: Abnormal   Collection Time: 04/26/23  6:56 AM   Specimen: Stool  Result Value Ref Range Status   Campylobacter species NOT DETECTED NOT DETECTED Final   Plesimonas shigelloides NOT DETECTED  NOT DETECTED Final   Salmonella species NOT DETECTED NOT DETECTED Final   Yersinia enterocolitica NOT DETECTED NOT DETECTED Final   Vibrio species DETECTED (A) NOT DETECTED Final    Comment: RESULT CALLED TO, READ BACK BY AND VERIFIED WITH: JOSEPH HAYES AT 6045 04/29/23.PMF    Vibrio cholerae DETECTED (A) NOT DETECTED Final    Comment: RESULT CALLED TO, READ BACK BY AND VERIFIED WITH: JOSEPH HAYES AT 4098 04/29/23.PMF    Enteroaggregative E coli (EAEC) NOT DETECTED NOT DETECTED Final   Enteropathogenic E coli (EPEC) NOT DETECTED NOT DETECTED Final   Enterotoxigenic E coli (ETEC) NOT DETECTED NOT DETECTED Final   Shiga like toxin producing E coli (STEC) NOT DETECTED NOT DETECTED Final   Shigella/Enteroinvasive E coli (EIEC) NOT DETECTED NOT DETECTED Final   Cryptosporidium NOT DETECTED NOT DETECTED Final   Cyclospora cayetanensis NOT DETECTED NOT DETECTED Final   Entamoeba histolytica NOT DETECTED NOT DETECTED Final   Giardia lamblia NOT DETECTED NOT DETECTED Final   Adenovirus F40/41 NOT DETECTED NOT DETECTED Final   Astrovirus NOT DETECTED NOT DETECTED Final   Norovirus GI/GII NOT DETECTED NOT DETECTED Final   Rotavirus A NOT DETECTED NOT DETECTED Final   Sapovirus (I, II, IV, and V) NOT DETECTED NOT DETECTED Final    Comment: Performed at [facility], [address] [place], [place] [address]  Stool culture     Status: None (Preliminary result)   Collection Time: 05/01/23  8:51 PM   Specimen: Stool  Result Value Ref Range Status   Salmonella/Shigella Screen PENDING  Incomplete   Campylobacter Culture PENDING  Incomplete   E coli, Shiga toxin Assay Negative Negative Final    Comment: (NOTE) Performed At: [facility] [address] [place], [place] [id number] Jolene Schimke MD YQ:[id number]     Coagulation Studies: No results for input(s): "LABPROT", "INR" in the last 72 hours.   Urinalysis: No results for input(s): "COLORURINE", "LABSPEC", "PHURINE",  "GLUCOSEU", "HGBUR", "BILIRUBINUR", "KETONESUR", "PROTEINUR", "UROBILINOGEN", "NITRITE", "LEUKOCYTESUR" in the last 72 hours.  Invalid input(s): "APPERANCEUR"    Imaging: DG Chest Port 1 View  Result Date: 05/02/2023 CLINICAL DATA:  Follow-up atelectasis EXAM: PORTABLE CHEST 1 VIEW COMPARISON:  04/29/2023 FINDINGS: Cardiac shadow is enlarged but stable. Postsurgical changes are noted. Tracheostomy tube is noted in satisfactory position. Persistent left-sided effusion and basilar opacity is noted. Central vascular congestion is noted with changes of pulmonary edema similar to that noted on the prior exam. No new focal abnormality  is seen. IMPRESSION: Stable appearance of the chest when compared with the previous exam. Electronically Signed   By: Alcide Clever M.D.   On: 05/02/2023 23:01     Medications:    sodium chloride 5 mL/hr at 05/02/23 0600     apixaban  2.5 mg Oral BID   aspirin EC  81 mg Oral Daily   atorvastatin  80 mg Oral q1800   carvedilol  6.25 mg Oral BID WC   Chlorhexidine Gluconate Cloth  6 each Topical Q0600   epoetin (EPOGEN/PROCRIT) injection  10,000 Units Intravenous Q M,W,F-HD   escitalopram  10 mg Oral Daily   gabapentin  300 mg Oral QHS   insulin aspart  0-5 Units Subcutaneous QHS   insulin aspart  0-6 Units Subcutaneous TID WC   insulin detemir  4 Units Subcutaneous BID   ipratropium-albuterol  3 mL Nebulization TID   irbesartan  150 mg Oral Daily   midodrine  5 mg Oral Once per day on Mon Wed Fri   multivitamin  1 tablet Oral QHS   pantoprazole  40 mg Oral Daily   sevelamer carbonate  1,600 mg Oral TID WC   traZODone  50 mg Oral Once per day on Mon Wed Fri   sodium chloride, acetaminophen **OR** acetaminophen, guaiFENesin-dextromethorphan, loperamide, ondansetron **OR** ondansetron (ZOFRAN) IV  Assessment/ Plan:  [name] is a 67 y.o.  female with end stage renal disease on hemodialysis, tracheostomy, hypertension, hyperlipidemia, diabetes  mellitus type II insulin dependent, depression, and diabetic neuropathy who presents to [facility] for 04/23/2023 for Acute on chronic respiratory failure with hypoxia ([name]) [J96.21] Acute on chronic diastolic (congestive) heart failure ([name]) [I50.33]  [facility] MWF [address] Left AVG 62kg  End Stage Kidney Disease:   -Patient scheduled to receive dialysis later today -UF goal 1.5 L as tolerated.  Hypertension with chronic kidney disease: current regimen of irbesartan, carvedilol and amlodipine. Home regimen of olmesartan instead of irbesartan. Also prescribed Midodrine to prevent hypotension during dialysis.   -Blood pressure 115/57, stable for this patient.  Diabetes mellitus type II with chronic kidney disease: insulin dependent.  Glucose elevated at times, primary team to manage sliding scale insulin.  Secondary Hyperparathyroidism - Continue sevelamer with meals  Anemia of chronic kidney disease: -Continue EPO 10,000 units with dialysis   LOS: 10   5/31/20241:28 PM

## 2023-05-04 NOTE — Progress Notes (Signed)
  Received patient in bed to unit.   Informed consent signed and in chart.    TX duration:1.41hrs      Transported back to floor  Hand-off given to patient's nurse. No c/o and no distress noted    Access used: LAVG Access issues: none   Total UF removed: 0.7l Medication(s) given: 10,000u epogen  Post HD VS: 113/48 Post HD weight: 64.6kg     Lynann Beaver  Kidney Dialysis Unit

## 2023-05-04 NOTE — Progress Notes (Signed)
PULMONOLOGY         Date: 05/04/2023,   MRN# [id number] Deborah [name] [birth year]     AdmissionWeight:  (Unable to weigh , Pt on stretcher, too short of breath to stand)                 CurrentWeight: 64.4 kg  Referring provider: Dr Sharl Ma   CHIEF COMPLAINT:   Acute on chronic hypoxemic respiratory failure   HISTORY OF PRESENT ILLNESS   This is a pleasant 67 year old female with a history of cardiac dysfunction including diastolic heart failure, chronic atrial fibrillation with atrial flutter, cardiogenic shock, metabolic syndrome with diabetes, dyslipidemia and ESRD with chronic hypertension and is currently receiving dialysis 3 times weekly Monday Wednesday Friday, does have chronic respiratory insufficiency with episodes of hypoxemia tracheostomy status currently requiring 10 L supplemental O2, this particular hospitalization complicated by increased O2 requirement with difficulty weaning.  PCCM consultation for further evaluation management.  05/01/23-  patient in bed on 10L/min trache collar, in no distress this am. CXR with pulm edema and pleural effusion worse on left.   05/02/23- patient on 10L/min 40% Fio2.  S/p ID evaluation no additional antimicrobials at this time. She does have significant atelectasis still and is using metaneb device to help treat this.  Difficult to wean due to fluid shifting with HD and pulmonary edema.  Gave lasix challenge yesterday but I&O not documented .  For HD today.  Repeat CXR today.   05/03/23- Patient s/p 1.5L UF removed via HD yesterday.   She continues to require 10L/min 40%FiO2.  She has not walked in 12 days.  She is generally ambulatory in outpatient dialysis center. We will initiate OT/PT.  Continue current abx.  Continue current Metaneb schedule.  She made no urine today despite lasix 80mg  so we will be solely dependent on strict I&O plus RRT.   05/04/23- patient with no changes overnight.  She went for dialysis today.  Patient did  have physical and occupational therapy which seemed to have helped her.  Her O2 has been weaned further to 8L/min.  She is weak and chronically deconditioned. Overall she seems to have recruited her lungs a bit better and we will continue metaneb therapy.    PAST MEDICAL HISTORY   Past Medical History:  Diagnosis Date   Acute on chronic diastolic CHF (congestive heart failure) ([name]) 01/04/2022   Atrial fibrillation ([name])    Atrial flutter ([name])    Cardiogenic shock ([name]) 08/17/2016   Diabetes mellitus    Diabetes mellitus type 2, controlled ([name]) 03/08/2012   ESRD (end stage renal disease) ([name])    Heart attack ([name])    Hyperlipidemia    Hypertension    MI, old    NSTEMI (non-ST elevated myocardial infarction) ([name])    Renal disorder    dialysls   Respiratory failure ([name]) 08/17/2016   ST elevation myocardial infarction involving left main coronary artery ([name])    STEMI (ST elevation myocardial infarction) ([name]) 08/15/2016   Type 2 diabetes mellitus with complication, with long-term current use of insulin [facility])      SURGICAL HISTORY   Past Surgical History:  Procedure Laterality Date   A/V FISTULAGRAM N/A 10/05/2017   Procedure: A/V Fistulagram;  Surgeon: Renford Dills, MD;  Location: [facility] INVASIVE CV LAB;  Service: Cardiovascular;  Laterality: N/A;   A/V SHUNTOGRAM N/A 10/05/2017   Procedure: A/V SHUNTOGRAM;  Surgeon: Renford Dills, MD;  Location: [facility] INVASIVE CV LAB;  Service: Cardiovascular;  Laterality: N/A;   AV FISTULA PLACEMENT Left 10/02/2016   Procedure: INSERTION OF ARTERIOVENOUS (AV) GORE-TEX GRAFT ARM;  Surgeon: Maeola Harman, MD;  Location: [facility] OR;  Service: Vascular;  Laterality: Left;   CARDIAC CATHETERIZATION N/A 08/15/2016   Procedure: Left Heart Cath and Coronary Angiography;  Surgeon: Alwyn Pea, MD;  Location: [facility] INVASIVE CV LAB;  Service: Cardiovascular;  Laterality: N/A;   CARDIAC CATHETERIZATION N/A 08/15/2016   Procedure:  Coronary Stent Intervention;  Surgeon: Alwyn Pea, MD;  Location: [facility] INVASIVE CV LAB;  Service: Cardiovascular;  Laterality: N/A;   CARDIAC CATHETERIZATION N/A 08/18/2016   Procedure: Right Heart Cath;  Surgeon: Dolores Patty, MD;  Location: [facility] INVASIVE CV LAB;  Service: Cardiovascular;  Laterality: N/A;   CARDIAC CATHETERIZATION N/A 08/18/2016   Procedure: IABP Insertion;  Surgeon: Dolores Patty, MD;  Location: [facility] INVASIVE CV LAB;  Service: Cardiovascular;  Laterality: N/A;   CARDIAC CATHETERIZATION Right 08/23/2016   Procedure: CENTRAL LINE INSERTION RIGHT SUBCLAVIAN;  Surgeon: Kerin Perna, MD;  Location: [facility] OR;  Service: Open Heart Surgery;  Laterality: Right;   ENDOVEIN HARVEST OF GREATER SAPHENOUS VEIN Left 08/23/2016   Procedure: ENDOVEIN HARVEST OF GREATER SAPHENOUS VEIN;  Surgeon: Kerin Perna, MD;  Location: [facility] OR;  Service: Open Heart Surgery;  Laterality: Left;   INSERTION OF DIALYSIS CATHETER Bilateral 08/31/2016   Procedure: INSERTION OF DIALYSIS CATHETER LEFT INTERNAL JUGULAR VEIN & INSERTION OF TRIPLE LUMEN RIGHT INTERNAL JUGULAR VEIN;  Surgeon: Chuck Hint, MD;  Location: [facility] OR;  Service: Vascular;  Laterality: Bilateral;   INTRAOPERATIVE TRANSESOPHAGEAL ECHOCARDIOGRAM N/A 08/23/2016   Procedure: INTRAOPERATIVE TRANSESOPHAGEAL ECHOCARDIOGRAM;  Surgeon: Kerin Perna, MD;  Location: [facility] OR;  Service: Open Heart Surgery;  Laterality: N/A;   IR GENERIC HISTORICAL  11/13/2016   IR US GUIDE VASC ACCESS LEFT 11/13/2016 Gilmer Mor, DO [facility]   IR GENERIC HISTORICAL  11/13/2016   IR FLUORO GUIDE CV LINE LEFT 11/13/2016 Gilmer Mor, DO [facility]   IR GENERIC HISTORICAL  11/13/2016   IR GASTROSTOMY TUBE MOD SED 11/13/2016 Gilmer Mor, DO [facility]   MITRAL VALVE REPAIR N/A 08/23/2016   Procedure: MITRAL VALVE REPAIR (MVR) USING EDWARDS MAGNA EASE BIOPROSTHESIS MITRAL  VALVE;  Surgeon: Kerin Perna, MD;  Location: [facility] OR;  Service: Open  Heart Surgery;  Laterality: N/A;   tracheostomy reversal     TRACHEOSTOMY TUBE PLACEMENT N/A 11/09/2016   Procedure: TRACHEOSTOMY;  Surgeon: Suzanna Obey, MD;  Location: [facility] OR;  Service: ENT;  Laterality: N/A;   VAGINAL DELIVERY     x 6     FAMILY HISTORY   Family History  Problem Relation Age of Onset   Hypertension Mother    Diabetes Mother    Breast cancer Sister    Hypertension Father      SOCIAL HISTORY   Social History   Tobacco Use   Smoking status: Never   Smokeless tobacco: Never  Substance Use Topics   Alcohol use: No   Drug use: No     MEDICATIONS    Home Medication:    Current Medication:  Current Facility-Administered Medications:    0.9 %  sodium chloride infusion, , Intravenous, PRN, [name], MD, Last Rate: 5 mL/hr at 05/02/23 0600, Infusion Verify at 05/02/23 0600   acetaminophen (TYLENOL) tablet 650 mg, 650 mg, Oral, Q6H PRN, 650 mg at 05/03/23 2156 **OR** acetaminophen (TYLENOL) suppository 650 mg, 650 mg, Rectal, Q6H  PRN, [name], MD   apixaban Everlene Balls) tablet 2.5 mg, 2.5 mg, Oral, BID, Sharl Ma, Gagan S, MD, 2.5 mg at 05/04/23 1610   aspirin EC tablet 81 mg, 81 mg, Oral, Daily, Sharl Ma, Gagan S, MD, 81 mg at 05/04/23 0818   atorvastatin (LIPITOR) tablet 80 mg, 80 mg, Oral, q1800, Sharl Ma, Gagan S, MD, 80 mg at 05/03/23 1848   carvedilol (COREG) tablet 6.25 mg, 6.25 mg, Oral, BID WC, Enedina Finner, MD, 6.25 mg at 05/04/23 0809   Chlorhexidine Gluconate Cloth 2 % PADS 6 each, 6 each, Topical, Q0600, [name], MD, 6 each at 05/04/23 0753   epoetin alfa (EPOGEN) injection 10,000 Units, 10,000 Units, Intravenous, Q M,W,F-HD, Sharl Ma, Sarina Ill, MD, 10,000 Units at 05/02/23 1321   escitalopram (LEXAPRO) tablet 10 mg, 10 mg, Oral, Daily, Sharl Ma, Gagan S, MD, 10 mg at 05/04/23 9604   gabapentin (NEURONTIN) capsule 300 mg, 300 mg, Oral, QHS, Sharl Ma, Gagan S, MD, 300 mg at 05/03/23 2155   guaiFENesin-dextromethorphan (ROBITUSSIN DM) 100-10 MG/5ML syrup 15 mL, 15  mL, Oral, Q4H PRN, Sharl Ma, Gagan S, MD, 15 mL at 05/01/23 1445   insulin aspart (novoLOG) injection 0-5 Units, 0-5 Units, Subcutaneous, QHS, Sharl Ma, Gagan S, MD, 1 Units at 05/03/23 2158   insulin aspart (novoLOG) injection 0-6 Units, 0-6 Units, Subcutaneous, TID WC, [name], MD, 2 Units at 05/03/23 1848   insulin detemir (LEVEMIR) injection 4 Units, 4 Units, Subcutaneous, BID, Sharl Ma, Gagan S, MD, 4 Units at 05/04/23 0818   ipratropium-albuterol (DUONEB) 0.5-2.5 (3) MG/3ML nebulizer solution 3 mL, 3 mL, Nebulization, TID, Sharl Ma, Sarina Ill, MD, 3 mL at 05/04/23 0751   irbesartan (AVAPRO) tablet 150 mg, 150 mg, Oral, Daily, Sharl Ma, Gagan S, MD, 150 mg at 05/04/23 0809   loperamide (IMODIUM) capsule 2 mg, 2 mg, Oral, PRN, [name], MD, 2 mg at 05/01/23 2121   midodrine (PROAMATINE) tablet 5 mg, 5 mg, Oral, Once per day on Mon Wed Fri, Lama, Gagan S, MD, 5 mg at 05/04/23 5409   multivitamin (RENA-VIT) tablet 1 tablet, 1 tablet, Oral, QHS, Sharl Ma, Sarina Ill, MD, 1 tablet at 05/03/23 2157   ondansetron (ZOFRAN) tablet 4 mg, 4 mg, Oral, Q6H PRN, 4 mg at 04/23/23 2346 **OR** ondansetron (ZOFRAN) injection 4 mg, 4 mg, Intravenous, Q6H PRN, [name], MD, 4 mg at 04/27/23 0043   pantoprazole (PROTONIX) EC tablet 40 mg, 40 mg, Oral, Daily, Sharl Ma, Gagan S, MD, 40 mg at 05/04/23 8119   sevelamer carbonate (RENVELA) tablet 1,600 mg, 1,600 mg, Oral, TID WC, Sharl Ma, Gagan S, MD, 1,600 mg at 05/04/23 0817   traZODone (DESYREL) tablet 50 mg, 50 mg, Oral, Once per day on Mon Wed Fri, Lama, Gagan S, MD, 50 mg at 05/02/23 2231    ALLERGIES   Patient has no known allergies.     REVIEW OF SYSTEMS    Review of Systems:  Gen:  Denies  fever, sweats, chills weigh loss  HEENT: Denies blurred vision, double vision, ear pain, eye pain, hearing loss, nose bleeds, sore throat Cardiac:  No dizziness, chest pain or heaviness, chest tightness,edema Resp:   reports dyspnea chronically  Gi: Denies swallowing difficulty,  stomach pain, nausea or vomiting, diarrhea, constipation, bowel incontinence Gu:  Denies bladder incontinence, burning urine Ext:   Denies Joint pain, stiffness or swelling Skin: Denies  skin rash, easy bruising or bleeding or hives Endoc:  Denies polyuria, polydipsia , polyphagia or weight change Psych:   Denies depression, insomnia  or hallucinations   Other:  All other systems negative   VS: BP 110/63 (BP Location: Right Arm)   Pulse 63   Temp 98.2 F (36.8 C)   Resp 18   Ht 5\' 3"  (1.6 m)   Wt 64.4 kg   SpO2 100%   BMI 25.15 kg/m      PHYSICAL EXAM    GENERAL:NAD, no fevers, chills, no weakness no fatigue HEAD: Normocephalic, atraumatic.  EYES: Pupils equal, round, reactive to light. Extraocular muscles intact. No scleral icterus.  MOUTH: Moist mucosal membrane. Dentition intact. No abscess noted.  EAR, NOSE, THROAT: Clear without exudates. No external lesions.  NECK: Supple. No thyromegaly. No nodules. No JVD.  PULMONARY: decreased breath sounds with mild rhonchi worse at bases bilaterally.  CARDIOVASCULAR: S1 and S2. Regular rate and rhythm. No murmurs, rubs, or gallops. No edema. Pedal pulses 2+ bilaterally.  GASTROINTESTINAL: Soft, nontender, nondistended. No masses. Positive bowel sounds. No hepatosplenomegaly.  MUSCULOSKELETAL: No swelling, clubbing, or edema. Range of motion full in all extremities.  NEUROLOGIC: Cranial nerves II through XII are intact. No gross focal neurological deficits. Sensation intact. Reflexes intact.  SKIN: No ulceration, lesions, rashes, or cyanosis. Skin warm and dry. Turgor intact.  PSYCHIATRIC: Mood, affect within normal limits. The patient is awake, alert and oriented x 3. Insight, judgment intact.       IMAGING   Narrative & Impression  CLINICAL DATA:  Atelectasis.   EXAM: PORTABLE CHEST 1 VIEW   COMPARISON:  Radiograph and CT 04/23/2023   FINDINGS: Tracheostomy tube tip at the thoracic inlet. Median sternotomy  and prosthetic cardiac valve. Stable cardiomegaly. Improvement in right pleural effusion. Similar left pleural effusion and basilar opacity. Pulmonary edema is unchanged. No pneumothorax.   IMPRESSION: 1. Improvement in right pleural effusion. 2. Unchanged left pleural effusion and basilar opacity. 3. Unchanged pulmonary edema.       -pulm edema and bilateral effusions worse on left - CXR 04/29/23 - independent review     ASSESSMENT/PLAN     Acute on chronic hypoxemic respiratory failure Status post chest x-ray-interval changes with improved right lower lobe however left hemithorax with interstitial edema and atelectasis. -CRP is within reference range suggestive of absence of inflammatory changes in low likelihood of infectious or inflammatory pneumonia, there is a very mild leukocytosis 11.3 which may be due to underlying GI cholera.    Bibasilar atelectasis -Due to compressive effects with surrounding pleural effusion as well as chronic deconditioning and interstitial edema with development of atelectatic segments.  Patient should start chronic physical therapy and Occupational Therapy with pulmonary rehab on outpatient basis, in the interim recruitment maneuvers with MetaNeb device utilizing albuterol as well as utilization of chest physiotherapy with incentive spirometry each hour.  continue inpatient physical and occupational therapy Patient is also on DuoNeb nebulizer therapy which should also help treat atelectasis.   Interstitial edema -Nephrology is on case patient is currently being dialyzed -Complex fluid management due to ESRD Strict I&O fluid restricted diet    Thank you for allowing me to participate in the care of this patient.   Patient/Family are satisfied with care plan and all questions have been answered.    Provider disclosure: Patient with at least one acute or chronic illness or injury that poses a threat to life or bodily function and is being  managed actively during this encounter.  All of the below services have been performed independently by signing provider:  review of prior documentation from internal and or external  health records.  Review of previous and current lab results.  Interview and comprehensive assessment during patient visit today. Review of current and previous chest radiographs/CT scans. Discussion of management and test interpretation with health care team and patient/family.   This document was prepared using Dragon voice recognition software and may include unintentional dictation errors.     Vida Rigger, M.D.  Division of Pulmonary & Critical Care Medicine

## 2023-05-05 DIAGNOSIS — J9621 Acute and chronic respiratory failure with hypoxia: Secondary | ICD-10-CM | POA: Diagnosis not present

## 2023-05-05 LAB — GLUCOSE, CAPILLARY
Glucose-Capillary: 131 mg/dL — ABNORMAL HIGH (ref 70–99)
Glucose-Capillary: 159 mg/dL — ABNORMAL HIGH (ref 70–99)
Glucose-Capillary: 196 mg/dL — ABNORMAL HIGH (ref 70–99)
Glucose-Capillary: 245 mg/dL — ABNORMAL HIGH (ref 70–99)

## 2023-05-05 LAB — STOOL CULTURE: E coli, Shiga toxin Assay: NEGATIVE

## 2023-05-05 NOTE — TOC Progression Note (Signed)
Transition of Care [facility]) - Progression Note    Patient Details  Name: Deborah [name] MRN: [id number] Date of Birth: 1956/09/07  Transition of Care [facility]) CM/SW Contact  Kemper Durie, RN Phone Number: 05/05/2023, 1:59 PM  Clinical Narrative:     Per MD, plan to discharge home on Monday.  Notified that son has concern about oxygen (may be running out), particularly when transporting patient to HD.  Call placed [employer] to follow up on son's concern, no answer, message left.  TOC will continue to follow for discharge planning.  Expected Discharge Plan: Home w Home Health Services Barriers to Discharge: Continued Medical Work up  Expected Discharge Plan and Services     Post Acute Care Choice: Resumption of Svcs/PTA Provider Living arrangements for the past 2 months: Apartment                                       Social Determinants of Health (SDOH) Interventions SDOH Screenings   Food Insecurity: No Food Insecurity (04/24/2023)  Housing: Patient Declined (04/24/2023)  Transportation Needs: No Transportation Needs (04/24/2023)  Utilities: Not At Risk (04/24/2023)  Tobacco Use: Low Risk  (04/24/2023)    Readmission Risk Interventions    04/27/2023    4:18 PM 11/04/2020    9:20 AM  Readmission Risk Prevention Plan  Transportation Screening Complete Complete  PCP or Specialist Appt within 3-5 Days Complete   HRI or Home Care Consult Complete   Social Work Consult for Recovery Care Planning/Counseling Complete   Palliative Care Screening Not Applicable   Medication Review [employer]) Complete   PCP or Specialist appointment within 3-5 days of discharge  Complete  SW Recovery Care/Counseling Consult  Complete  Palliative Care Screening  Not Applicable  Skilled Nursing Facility  Not Applicable

## 2023-05-05 NOTE — Progress Notes (Signed)
[facility]  - [place] at [facility]   PATIENT NAME: Deborah [name]    MR#:  [id number]  DATE OF BIRTH:  [birth year]  SUBJECTIVE:  patient sitting up in the bed eating BF. Overall stable Son franco in the room with RT. Discussed her oxygen requirement with son Patient remains on 35% FIR to 10 L nasal cannula oxygen. Respiratory therapist working to wean her down. VITALS:  Blood pressure (!) 120/54, pulse 72, temperature 98.3 F (36.8 C), temperature source Oral, resp. rate 16, height 5\' 3"  (1.6 m), weight 64.6 kg, SpO2 95 %.  PHYSICAL EXAMINATION:   GENERAL:  67 y.o.-year-old patient with no acute distress.  LUNGS: Normal breath sounds bilaterally, no wheezing. Trach Collar+ CARDIOVASCULAR: S1, S2 normal. No murmur   ABDOMEN: Soft, nontender, nondistended. Bowel sounds present.  EXTREMITIES: No  edema b/l.   HD access+ NEUROLOGIC: nonfocal  patient is alert and awake  LABORATORY PANEL:  CBC Recent Labs  Lab 05/04/23 1626  WBC 9.1  HGB 7.8*  HCT 24.4*  PLT 316     Chemistries  Recent Labs  Lab 05/04/23 1626  NA 134*  K 4.6  CL 95*  CO2 27  GLUCOSE 237*  BUN 42*  CREATININE 8.69*  CALCIUM 8.7*    Assessment and Plan  Acute on chronic respiratory failure with hypoxia -Continue to require 10 L/min oxygen; usually less than 6 L/min at home -Chronic Tracheostomy status -CT chest showed vascular congestion/volume overload with acute on chronic diastolic heart failure -Nephrology consulted for dialysis -Swallow evaluation obtained, recommend regular consistency diet with thin liquids, aspiration precautions -Pulmonology consulted; started on inpatient physical and occupational therapy, and recommend outpatient PT/OT with pulmonary rehab.  Started on chest PT and incentive spirometry for bibasilar atelectasis.  -Chest x-ray obtained today shows unchanged left pleural effusion and basilar opacity, unchanged pulmonary edema, improvement right pleural  effusion -Lasix 80 mg IV daily ordered per pulmonology --d/w Dr Rubye Beach down oxygen to keep sats >89-90% -- discussed with respiratory therapist. Patient has asymptomatic and her sats intermittently drop in the 80s without any distress. Comes up in few seconds to minute.  --cont metaneb --6/1 d/w RT--per RT "The air entrainment device she is connected to in the room requires 10 liters of oxygen to produce 35% FIO2 of oxygen. If someone were on 10lpm of oxygen nasal canula that would be 60% FIO2. There are many different kinds of entrainment devices. --currently pt is on 3-4 liter/min of her home equivalent oxygen. -- Patient son Lynnell Chad was explained in presence of respiratory therapist. He raised concern about her portable oxygen. TOC has left message with oxygen company to try resolved the problem. Patient's son understands patient is supposed to be on chronic oxygen at home equilateral 3 to 4 L per minute continuous. -- Patient overall feels better. Discussed about discharge plan to home on Monday. Patient son appeared a bit overwhelmed however did explain that this is her best baseline. -- Physical therapy evaluation noted. Home health PT will be arranged.  Community-acquired pneumonia -Had been on cefepime during previous hospital stay and was discharged on Levaquin; family never filled Levaquin prescription -Sputum culture also grew Corynebacterium species -ID was consult--pt was treated for  pseudomonas resp infection with cefepime for 10 days-total.    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 Rivka Safer -She recommends to obtain stool culture and  treat with azithromycin 1 g p.o. x 1.     ESRD -Patient on hemodialysis   Mitral valve replacement -2D echo showed normal functioning valve, EF 55 to 60%   Paroxysmal atrial fibrillation -Continue Eliquis,  Coreg   Diabetes mellitus type 2 with ESRD -Continue Levemir, sliding scale insulin with NovoLog -CBG well-controlled   Hypertension --Blood pressure is stable -- amlodipine, Coreg,irbesartan --takes midodrine on HD days  HHPT at discharge  Family communication :son franco Consults : ID, pulmonary, nephrology CODE STATUS: full DVT Prophylaxis : eliquis Level of care: Progressive Status is: Inpatient Remains inpatient appropriate because:will monitor over weekend and if stable d/c home Monday.  TOTAL TIME TAKING CARE OF THIS PATIENT: 35 minutes.  >50% time spent on counselling and coordination of care  Note: This dictation was prepared with Dragon dictation along with smaller phrase technology. Any transcriptional errors that result from this process are unintentional.  Enedina Finner M.D    [facility]   CC: Primary care physician; Si Gaul, MD

## 2023-05-05 NOTE — Progress Notes (Signed)
[facility]  ROUNDING NOTE   Subjective:   [name] returns to [facility] on 04/23/2023 for Acute on chronic respiratory failure with hypoxia ([name]) [J96.21] Acute on chronic diastolic (congestive) heart failure ([name]) [I50.33]  Son at bedside.   Hemodialysis treatment yesterday. Tolerated treatment well. UF of .   Objective:  Vital signs in last 24 hours:  Temp:  [97.6 F (36.4 C)-99.4 F (37.4 C)] 98.3 F (36.8 C) (06/01 1140) Pulse Rate:  [64-74] 72 (06/01 1140) Resp:  [11-20] 16 (06/01 1140) BP: (104-123)/(54-65) 120/54 (06/01 1140) SpO2:  [91 %-98 %] 95 % (06/01 1140) FiO2 (%):  [35 %] 35 % (06/01 1406) Weight:  [64.6 kg-65.4 kg] 64.6 kg (05/31 1811)  Weight change: 1 kg Filed Weights   05/03/23 0823 05/04/23 1617 05/04/23 1811  Weight: 64.4 kg 65.4 kg 64.6 kg    Intake/Output: I/O last 3 completed shifts: In: -  Out: 700 [Other:700]   Intake/Output this shift:  No intake/output data recorded.  Physical Exam: General: NAD  Head: Normocephalic, atraumatic. Moist oral mucosal membranes  Eyes: Anicteric  Neck: +tracheostomy  Lungs:  Diminished in bases  bilaterally  Heart: Regular rate and rhythm  Abdomen:  Soft, nontender  Extremities: No peripheral edema.  Neurologic: Nonfocal, moving all four extremities  Skin: No lesions  Access: Left AVG    Basic Metabolic Panel: Recent Labs  Lab 04/30/23 1000 05/02/23 0934 05/04/23 1626  NA 135 138 134*  K 3.8 4.3 4.6  CL 98 98 95*  CO2 24 29 27   GLUCOSE 150* 153* 237*  BUN 51* 45* 42*  CREATININE 10.47* 8.79* 8.69*  CALCIUM 8.7* 9.0 8.7*  PHOS 4.3 4.6 5.2*     Liver Function Tests: Recent Labs  Lab 04/30/23 1000 05/02/23 0934 05/04/23 1626  ALBUMIN 2.9* 3.0* 3.0*    No results for input(s): "LIPASE", "AMYLASE" in the last 168 hours. No results for input(s): "AMMONIA" in the last 168 hours.  CBC: Recent Labs  Lab 04/30/23 1000 05/04/23 1626  WBC 11.3* 9.1  HGB 7.6*  7.8*  HCT 23.0* 24.4*  MCV 92.0 95.3  PLT 245 316     Cardiac Enzymes: No results for input(s): "CKTOTAL", "CKMB", "CKMBINDEX", "TROPONINI" in the last 168 hours.  BNP: Invalid input(s): "POCBNP"  CBG: Recent Labs  Lab 05/04/23 0750 05/04/23 1237 05/04/23 2123 05/05/23 0817 05/05/23 1137  GLUCAP 124* 194* 279* 131* 159*     Microbiology: Results for orders placed or performed during the hospital encounter of 04/23/23  Organism ID, Bacteria     Status: None (Preliminary result)   Collection Time: 04/18/23  8:20 PM  Result Value Ref Range Status   Organism ID, Bacteria REFERT  Final    Comment: (NOTE) Please refer to the following specimen for additional lab results. Please refer to spec # [id number] for test result. Camelia Moraru notified 05-02-2023 at 13:00 Performed At: [facility] [address] [place], [place] [id number] Jolene Schimke MD [id number]    Source of Sample PENDING  Incomplete  Bacterial organism reflex     Status: None   Collection Time: 04/18/23  8:20 PM  Result Value Ref Range Status   Bacterial result 1 Comment  Final    Comment: (NOTE) Specimen has been received and testing has been initiated. Performed At: [facility] [address] [place], [place] [id number] Jolene Schimke MD GU:[id number]   Organism ID by MALDI     Status: None (Preliminary result)   Collection Time:  04/18/23  8:20 PM  Result Value Ref Range Status   Organism ID by MALDI Comment  Final    Comment: (NOTE) Specimen has been received. Testing has been initiated. Performed At: [facility] [address] [place], [place] [id number] Jolene Schimke MD ON:[id number]    Source (MTB RIF) PENDING  Incomplete  Aerobic ID by MALDI     Status: None   Collection Time: 04/18/23  8:20 PM  Result Value Ref Range Status   Aerobic ID by MALDI Comment  Final    Comment: (NOTE) Unable to identify by MALDI. See Organism ID by Sequencing. Performed  At: [facility] [address] [place], [place] [id number] Jolene Schimke MD [id number]   SARS Coronavirus 2 by RT PCR (hospital order, performed in [facility] hospital lab) *cepheid single result test* Anterior Nasal Swab     Status: None   Collection Time: 04/23/23  9:30 AM   Specimen: Anterior Nasal Swab  Result Value Ref Range Status   SARS Coronavirus 2 by RT PCR NEGATIVE NEGATIVE Final    Comment: (NOTE) SARS-CoV-2 target nucleic acids are NOT DETECTED.  The SARS-CoV-2 RNA is generally detectable in upper and lower respiratory specimens during the acute phase of infection. The lowest concentration of SARS-CoV-2 viral copies this assay can detect is 250 copies / mL. A negative result does not preclude SARS-CoV-2 infection and should not be used as the sole basis for treatment or other patient management decisions.  A negative result may occur with improper specimen collection / handling, submission of specimen other than nasopharyngeal swab, presence of viral mutation(s) within the areas targeted by this assay, and inadequate number of viral copies (<250 copies / mL). A negative result must be combined with clinical observations, patient history, and epidemiological information.  Fact Sheet for Patients:   RoadLapTop.co.za  Fact Sheet for Healthcare Providers: http://kim-miller.com/  This test is not yet approved or  cleared by the [place] FDA and has been authorized for detection and/or diagnosis of SARS-CoV-2 by FDA under an Emergency Use Authorization (EUA).  This EUA will remain in effect (meaning this test can be used) for the duration of the COVID-19 declaration under Section 564(b)(1) of the Act, 21 U.S.C. section 360bbb-3(b)(1), unless the authorization is terminated or revoked sooner.  Performed at [facility], [address] [place], [place] [address]   Culture, blood (Routine X 2) w  Reflex to ID Panel     Status: None   Collection Time: 04/24/23  7:52 PM   Specimen: BLOOD  Result Value Ref Range Status   Specimen Description BLOOD RIGHT ANTECUBITAL  Final   Special Requests   Final    BOTTLES DRAWN AEROBIC AND ANAEROBIC Blood Culture adequate volume   Culture   Final    NO GROWTH 5 DAYS Performed at [facility], [address], [place], [place] [address]    Report Status 04/29/2023 FINAL  Final  Culture, blood (Routine X 2) w Reflex to ID Panel     Status: None   Collection Time: 04/24/23  7:56 PM   Specimen: BLOOD  Result Value Ref Range Status   Specimen Description BLOOD RIGHT ANTECUBITAL  Final   Special Requests   Final    BOTTLES DRAWN AEROBIC AND ANAEROBIC Blood Culture adequate volume   Culture   Final    NO GROWTH 5 DAYS Performed at [facility], [address], [place], [place] [address]    Report Status 04/29/2023 FINAL  Final  Respiratory (~20 pathogens) panel by PCR     Status: None   Collection Time: 04/25/23  2:01 PM   Specimen: Nasopharyngeal Swab; Respiratory  Result Value Ref Range Status   Adenovirus NOT DETECTED NOT DETECTED Final   Coronavirus 229E NOT DETECTED NOT DETECTED Final    Comment: (NOTE) The Coronavirus on the Respiratory Panel, DOES NOT test for the novel  Coronavirus (2019 nCoV)    Coronavirus HKU1 NOT DETECTED NOT DETECTED Final   Coronavirus NL63 NOT DETECTED NOT DETECTED Final   Coronavirus OC43 NOT DETECTED NOT DETECTED Final   Metapneumovirus NOT DETECTED NOT DETECTED Final   Rhinovirus / Enterovirus NOT DETECTED NOT DETECTED Final   Influenza A NOT DETECTED NOT DETECTED Final   Influenza B NOT DETECTED NOT DETECTED Final   Parainfluenza Virus 1 NOT DETECTED NOT DETECTED Final   Parainfluenza Virus 2 NOT DETECTED NOT DETECTED Final   Parainfluenza Virus 3 NOT DETECTED NOT DETECTED Final   Parainfluenza Virus 4 NOT DETECTED NOT DETECTED Final   Respiratory Syncytial Virus NOT DETECTED NOT  DETECTED Final   Bordetella pertussis NOT DETECTED NOT DETECTED Final   Bordetella Parapertussis NOT DETECTED NOT DETECTED Final   Chlamydophila pneumoniae NOT DETECTED NOT DETECTED Final   Mycoplasma pneumoniae NOT DETECTED NOT DETECTED Final    Comment: Performed at [facility], [address], [place], [place] [address]  Gastrointestinal Panel by PCR , Stool     Status: Abnormal   Collection Time: 04/26/23  6:56 AM   Specimen: Stool  Result Value Ref Range Status   Campylobacter species NOT DETECTED NOT DETECTED Final   Plesimonas shigelloides NOT DETECTED NOT DETECTED Final   Salmonella species NOT DETECTED NOT DETECTED Final   Yersinia enterocolitica NOT DETECTED NOT DETECTED Final   Vibrio species DETECTED (A) NOT DETECTED Final    Comment: RESULT CALLED TO, READ BACK BY AND VERIFIED WITH: JOSEPH HAYES AT 6045 04/29/23.PMF    Vibrio cholerae DETECTED (A) NOT DETECTED Final    Comment: RESULT CALLED TO, READ BACK BY AND VERIFIED WITH: JOSEPH HAYES AT 4098 04/29/23.PMF    Enteroaggregative E coli (EAEC) NOT DETECTED NOT DETECTED Final   Enteropathogenic E coli (EPEC) NOT DETECTED NOT DETECTED Final   Enterotoxigenic E coli (ETEC) NOT DETECTED NOT DETECTED Final   Shiga like toxin producing E coli (STEC) NOT DETECTED NOT DETECTED Final   Shigella/Enteroinvasive E coli (EIEC) NOT DETECTED NOT DETECTED Final   Cryptosporidium NOT DETECTED NOT DETECTED Final   Cyclospora cayetanensis NOT DETECTED NOT DETECTED Final   Entamoeba histolytica NOT DETECTED NOT DETECTED Final   Giardia lamblia NOT DETECTED NOT DETECTED Final   Adenovirus F40/41 NOT DETECTED NOT DETECTED Final   Astrovirus NOT DETECTED NOT DETECTED Final   Norovirus GI/GII NOT DETECTED NOT DETECTED Final   Rotavirus A NOT DETECTED NOT DETECTED Final   Sapovirus (I, II, IV, and V) NOT DETECTED NOT DETECTED Final    Comment: Performed at [facility], [address] [place], [place] [address]  Stool  culture     Status: None (Preliminary result)   Collection Time: 05/01/23  8:51 PM   Specimen: Stool  Result Value Ref Range Status   Salmonella/Shigella Screen Final report  Final   Campylobacter Culture PENDING  Incomplete   E coli, Shiga toxin Assay Negative Negative Final    Comment: (NOTE) Performed At: [facility] [address] [place], [place] [id number] Jolene Schimke MD YQ:[id number]   STOOL CULTURE REFLEX -  RSASHR     Status: None   Collection Time: 05/01/23  8:51 PM  Result Value Ref Range Status   Stool Culture result 1 (RSASHR) Comment  Final    Comment: (NOTE) No Salmonella or Shigella recovered. Performed At: [facility] [address] [place], [place] [id number] Jolene Schimke MD WU:[id number]     Coagulation Studies: No results for input(s): "LABPROT", "INR" in the last 72 hours.   Urinalysis: No results for input(s): "COLORURINE", "LABSPEC", "PHURINE", "GLUCOSEU", "HGBUR", "BILIRUBINUR", "KETONESUR", "PROTEINUR", "UROBILINOGEN", "NITRITE", "LEUKOCYTESUR" in the last 72 hours.  Invalid input(s): "APPERANCEUR"    Imaging: No results found.   Medications:    sodium chloride 5 mL/hr at 05/02/23 0600     apixaban  2.5 mg Oral BID   aspirin EC  81 mg Oral Daily   atorvastatin  80 mg Oral q1800   carvedilol  6.25 mg Oral BID WC   Chlorhexidine Gluconate Cloth  6 each Topical Q0600   epoetin (EPOGEN/PROCRIT) injection  10,000 Units Intravenous Q M,W,F-HD   escitalopram  10 mg Oral Daily   gabapentin  300 mg Oral QHS   insulin aspart  0-5 Units Subcutaneous QHS   insulin aspart  0-6 Units Subcutaneous TID WC   insulin detemir  4 Units Subcutaneous BID   ipratropium-albuterol  3 mL Nebulization TID   irbesartan  150 mg Oral Daily   midodrine  5 mg Oral Once per day on Mon Wed Fri   multivitamin  1 tablet Oral QHS   pantoprazole  40 mg Oral Daily   sevelamer carbonate  1,600 mg Oral TID WC   traZODone  50 mg Oral Once per day on  Mon Wed Fri   sodium chloride, acetaminophen **OR** acetaminophen, guaiFENesin-dextromethorphan, loperamide, ondansetron **OR** ondansetron (ZOFRAN) IV  Assessment/ Plan:  [name] is a 67 y.o.  female with end stage renal disease on hemodialysis, tracheostomy, hypertension, hyperlipidemia, diabetes mellitus type II insulin dependent, depression, and diabetic neuropathy who presents to [facility] for 04/23/2023 for Acute on chronic respiratory failure with hypoxia ([name]) [J96.21] Acute on chronic diastolic (congestive) heart failure ([name]) [I50.33]  [facility] MWF [address] Left AVG 62kg  End Stage Kidney Disease: continue MWF schedule.   Hypertension with chronic kidney disease: current regimen of irbesartan, carvedilol and amlodipine. Home regimen of olmesartan instead of irbesartan. Also prescribed Midodrine to prevent hypotension during dialysis.    Diabetes mellitus type II with chronic kidney disease: insulin dependent.   Secondary Hyperparathyroidism - Continue sevelamer with meals  Anemia of chronic kidney disease: -Continue EPO 10,000 units with dialysis   LOS: 11 Braylyn Eye 6/1/20243:12 PM

## 2023-05-05 NOTE — Progress Notes (Signed)
PULMONOLOGY         Date: 05/05/2023,   MRN# [id number] LAELYN CORELLA December 03, 1956     AdmissionWeight:  (Unable to weigh , Pt on stretcher, too short of breath to stand)                 CurrentWeight: 64.6 kg  Referring provider: Dr Sharl Ma   CHIEF COMPLAINT:   Acute on chronic hypoxemic respiratory failure   HISTORY OF PRESENT ILLNESS   This is a pleasant 67 year old female with a history of cardiac dysfunction including diastolic heart failure, chronic atrial fibrillation with atrial flutter, cardiogenic shock, metabolic syndrome with diabetes, dyslipidemia and ESRD with chronic hypertension and is currently receiving dialysis 3 times weekly Monday Wednesday Friday, does have chronic respiratory insufficiency with episodes of hypoxemia tracheostomy status currently requiring 10 L supplemental O2, this particular hospitalization complicated by increased O2 requirement with difficulty weaning.  PCCM consultation for further evaluation management.  05/01/23-  patient in bed on 10L/min trache collar, in no distress this am. CXR with pulm edema and pleural effusion worse on left.   05/02/23- patient on 10L/min 40% Fio2.  S/p ID evaluation no additional antimicrobials at this time. She does have significant atelectasis still and is using metaneb device to help treat this.  Difficult to wean due to fluid shifting with HD and pulmonary edema.  Gave lasix challenge yesterday but I&O not documented .  For HD today.  Repeat CXR today.   05/03/23- Patient s/p 1.5L UF removed via HD yesterday.   She continues to require 10L/min 40%FiO2.  She has not walked in 12 days.  She is generally ambulatory in outpatient dialysis center. We will initiate OT/PT.  Continue current abx.  Continue current Metaneb schedule.  She made no urine today despite lasix 80mg  so we will be solely dependent on strict I&O plus RRT.   05/04/23- patient with no changes overnight.  She went for dialysis today.  Patient did  have physical and occupational therapy which seemed to have helped her.  Her O2 has been weaned further to 8L/min.  She is weak and chronically deconditioned. Overall she seems to have recruited her lungs a bit better and we will continue metaneb therapy.   05/05/23- patient is weaned down to 6L/min 35%, she was sitting up during interview and was able to speak in full sentences reports reduced dyspnea. She is still rhonchorous on examination but improved overall.    PAST MEDICAL HISTORY   Past Medical History:  Diagnosis Date   Acute on chronic diastolic CHF (congestive heart failure) ([name]) 01/04/2022   Atrial fibrillation ([name])    Atrial flutter ([name])    Cardiogenic shock ([name]) 08/17/2016   Diabetes mellitus    Diabetes mellitus type 2, controlled ([name]) 03/08/2012   ESRD (end stage renal disease) ([name])    Heart attack ([name])    Hyperlipidemia    Hypertension    MI, old    NSTEMI (non-ST elevated myocardial infarction) ([name])    Renal disorder    dialysls   Respiratory failure ([name]) 08/17/2016   ST elevation myocardial infarction involving left main coronary artery ([name])    STEMI (ST elevation myocardial infarction) ([name]) 08/15/2016   Type 2 diabetes mellitus with complication, with long-term current use of insulin [facility])      SURGICAL HISTORY   Past Surgical History:  Procedure Laterality Date   A/V FISTULAGRAM N/A 10/05/2017   Procedure: A/V Fistulagram;  Surgeon: Renford Dills, MD;  Location: [facility] INVASIVE CV LAB;  Service: Cardiovascular;  Laterality: N/A;   A/V SHUNTOGRAM N/A 10/05/2017   Procedure: A/V SHUNTOGRAM;  Surgeon: Renford Dills, MD;  Location: [facility] INVASIVE CV LAB;  Service: Cardiovascular;  Laterality: N/A;   AV FISTULA PLACEMENT Left 10/02/2016   Procedure: INSERTION OF ARTERIOVENOUS (AV) GORE-TEX GRAFT ARM;  Surgeon: Maeola Harman, MD;  Location: [facility] OR;  Service: Vascular;  Laterality: Left;   CARDIAC CATHETERIZATION N/A 08/15/2016    Procedure: Left Heart Cath and Coronary Angiography;  Surgeon: Alwyn Pea, MD;  Location: [facility] INVASIVE CV LAB;  Service: Cardiovascular;  Laterality: N/A;   CARDIAC CATHETERIZATION N/A 08/15/2016   Procedure: Coronary Stent Intervention;  Surgeon: Alwyn Pea, MD;  Location: [facility] INVASIVE CV LAB;  Service: Cardiovascular;  Laterality: N/A;   CARDIAC CATHETERIZATION N/A 08/18/2016   Procedure: Right Heart Cath;  Surgeon: Dolores Patty, MD;  Location: [facility] INVASIVE CV LAB;  Service: Cardiovascular;  Laterality: N/A;   CARDIAC CATHETERIZATION N/A 08/18/2016   Procedure: IABP Insertion;  Surgeon: Dolores Patty, MD;  Location: [facility] INVASIVE CV LAB;  Service: Cardiovascular;  Laterality: N/A;   CARDIAC CATHETERIZATION Right 08/23/2016   Procedure: CENTRAL LINE INSERTION RIGHT SUBCLAVIAN;  Surgeon: Kerin Perna, MD;  Location: [facility] OR;  Service: Open Heart Surgery;  Laterality: Right;   ENDOVEIN HARVEST OF GREATER SAPHENOUS VEIN Left 08/23/2016   Procedure: ENDOVEIN HARVEST OF GREATER SAPHENOUS VEIN;  Surgeon: Kerin Perna, MD;  Location: [facility] OR;  Service: Open Heart Surgery;  Laterality: Left;   INSERTION OF DIALYSIS CATHETER Bilateral 08/31/2016   Procedure: INSERTION OF DIALYSIS CATHETER LEFT INTERNAL JUGULAR VEIN & INSERTION OF TRIPLE LUMEN RIGHT INTERNAL JUGULAR VEIN;  Surgeon: Chuck Hint, MD;  Location: [facility] OR;  Service: Vascular;  Laterality: Bilateral;   INTRAOPERATIVE TRANSESOPHAGEAL ECHOCARDIOGRAM N/A 08/23/2016   Procedure: INTRAOPERATIVE TRANSESOPHAGEAL ECHOCARDIOGRAM;  Surgeon: Kerin Perna, MD;  Location: [facility] OR;  Service: Open Heart Surgery;  Laterality: N/A;   IR GENERIC HISTORICAL  11/13/2016   IR US GUIDE VASC ACCESS LEFT 11/13/2016 Gilmer Mor, DO [facility]   IR GENERIC HISTORICAL  11/13/2016   IR FLUORO GUIDE CV LINE LEFT 11/13/2016 Gilmer Mor, DO [facility]   IR GENERIC HISTORICAL  11/13/2016   IR GASTROSTOMY TUBE MOD SED 11/13/2016 Gilmer Mor, DO [facility]   MITRAL VALVE REPAIR N/A 08/23/2016   Procedure: MITRAL VALVE REPAIR (MVR) USING EDWARDS MAGNA EASE BIOPROSTHESIS MITRAL  VALVE;  Surgeon: Kerin Perna, MD;  Location: [facility] OR;  Service: Open Heart Surgery;  Laterality: N/A;   tracheostomy reversal     TRACHEOSTOMY TUBE PLACEMENT N/A 11/09/2016   Procedure: TRACHEOSTOMY;  Surgeon: Suzanna Obey, MD;  Location: [facility] OR;  Service: ENT;  Laterality: N/A;   VAGINAL DELIVERY     x 6     FAMILY HISTORY   Family History  Problem Relation Age of Onset   Hypertension Mother    Diabetes Mother    Breast cancer Sister    Hypertension Father      SOCIAL HISTORY   Social History   Tobacco Use   Smoking status: Never   Smokeless tobacco: Never  Substance Use Topics   Alcohol use: No   Drug use: No     MEDICATIONS    Home Medication:    Current Medication:  Current Facility-Administered Medications:    0.9 %  sodium chloride infusion, , Intravenous, PRN, Sharl Ma, Sarina Ill, MD, Last Rate: 5 mL/hr  at 05/02/23 0600, Infusion Verify at 05/02/23 0600   acetaminophen (TYLENOL) tablet 650 mg, 650 mg, Oral, Q6H PRN, 650 mg at 05/03/23 2156 **OR** acetaminophen (TYLENOL) suppository 650 mg, 650 mg, Rectal, Q6H PRN, Sharl Ma, Sarina Ill, MD   apixaban (ELIQUIS) tablet 2.5 mg, 2.5 mg, Oral, BID, Sharl Ma, Gagan S, MD, 2.5 mg at 05/04/23 2156   aspirin EC tablet 81 mg, 81 mg, Oral, Daily, Sharl Ma, Gagan S, MD, 81 mg at 05/04/23 0818   atorvastatin (LIPITOR) tablet 80 mg, 80 mg, Oral, q1800, [name], MD, 80 mg at 05/04/23 2155   carvedilol (COREG) tablet 6.25 mg, 6.25 mg, Oral, BID WC, Enedina Finner, MD, 6.25 mg at 05/04/23 2156   Chlorhexidine Gluconate Cloth 2 % PADS 6 each, 6 each, Topical, Q0600, [name], MD, 6 each at 05/05/23 0529   epoetin alfa (EPOGEN) injection 10,000 Units, 10,000 Units, Intravenous, Q M,W,F-HD, Sharl Ma, Sarina Ill, MD, 10,000 Units at 05/04/23 1714   escitalopram (LEXAPRO) tablet 10 mg, 10 mg, Oral,  Daily, Sharl Ma, Gagan S, MD, 10 mg at 05/04/23 1610   gabapentin (NEURONTIN) capsule 300 mg, 300 mg, Oral, QHS, Sharl Ma, Gagan S, MD, 300 mg at 05/04/23 2155   guaiFENesin-dextromethorphan (ROBITUSSIN DM) 100-10 MG/5ML syrup 15 mL, 15 mL, Oral, Q4H PRN, Sharl Ma, Gagan S, MD, 15 mL at 05/01/23 1445   insulin aspart (novoLOG) injection 0-5 Units, 0-5 Units, Subcutaneous, QHS, [name], MD, 3 Units at 05/04/23 2156   insulin aspart (novoLOG) injection 0-6 Units, 0-6 Units, Subcutaneous, TID WC, [name], MD, 1 Units at 05/04/23 1244   insulin detemir (LEVEMIR) injection 4 Units, 4 Units, Subcutaneous, BID, Sharl Ma, Gagan S, MD, 4 Units at 05/04/23 2154   ipratropium-albuterol (DUONEB) 0.5-2.5 (3) MG/3ML nebulizer solution 3 mL, 3 mL, Nebulization, TID, Sharl Ma, Sarina Ill, MD, 3 mL at 05/05/23 0724   irbesartan (AVAPRO) tablet 150 mg, 150 mg, Oral, Daily, Sharl Ma, Gagan S, MD, 150 mg at 05/04/23 0809   loperamide (IMODIUM) capsule 2 mg, 2 mg, Oral, PRN, [name], MD, 2 mg at 05/01/23 2121   midodrine (PROAMATINE) tablet 5 mg, 5 mg, Oral, Once per day on Mon Wed Fri, Lama, Gagan S, MD, 5 mg at 05/04/23 9604   multivitamin (RENA-VIT) tablet 1 tablet, 1 tablet, Oral, QHS, Sharl Ma, Sarina Ill, MD, 1 tablet at 05/04/23 2154   ondansetron (ZOFRAN) tablet 4 mg, 4 mg, Oral, Q6H PRN, 4 mg at 04/23/23 2346 **OR** ondansetron (ZOFRAN) injection 4 mg, 4 mg, Intravenous, Q6H PRN, [name], MD, 4 mg at 04/27/23 0043   pantoprazole (PROTONIX) EC tablet 40 mg, 40 mg, Oral, Daily, Sharl Ma, Gagan S, MD, 40 mg at 05/04/23 5409   sevelamer carbonate (RENVELA) tablet 1,600 mg, 1,600 mg, Oral, TID WC, Sharl Ma, Gagan S, MD, 1,600 mg at 05/04/23 1243   traZODone (DESYREL) tablet 50 mg, 50 mg, Oral, Once per day on Mon Wed Fri, Lama, Gagan S, MD, 50 mg at 05/04/23 2155    ALLERGIES   Patient has no known allergies.     REVIEW OF SYSTEMS    Review of Systems:  Gen:  Denies  fever, sweats, chills weigh loss  HEENT: Denies  blurred vision, double vision, ear pain, eye pain, hearing loss, nose bleeds, sore throat Cardiac:  No dizziness, chest pain or heaviness, chest tightness,edema Resp:   reports dyspnea chronically  Gi: Denies swallowing difficulty, stomach pain, nausea or vomiting, diarrhea, constipation, bowel incontinence Gu:  Denies bladder incontinence, burning urine Ext:  Denies Joint pain, stiffness or swelling Skin: Denies  skin rash, easy bruising or bleeding or hives Endoc:  Denies polyuria, polydipsia , polyphagia or weight change Psych:   Denies depression, insomnia or hallucinations   Other:  All other systems negative   VS: BP 120/60 (BP Location: Right Arm)   Pulse 71   Temp 98.2 F (36.8 C) (Oral)   Resp 16   Ht 5\' 3"  (1.6 m)   Wt 64.6 kg   SpO2 94%   BMI 25.23 kg/m      PHYSICAL EXAM    GENERAL:NAD, no fevers, chills, no weakness no fatigue HEAD: Normocephalic, atraumatic.  EYES: Pupils equal, round, reactive to light. Extraocular muscles intact. No scleral icterus.  MOUTH: Moist mucosal membrane. Dentition intact. No abscess noted.  EAR, NOSE, THROAT: Clear without exudates. No external lesions.  NECK: Supple. No thyromegaly. No nodules. No JVD.  PULMONARY: decreased breath sounds with mild rhonchi worse at bases bilaterally.  CARDIOVASCULAR: S1 and S2. Regular rate and rhythm. No murmurs, rubs, or gallops. No edema. Pedal pulses 2+ bilaterally.  GASTROINTESTINAL: Soft, nontender, nondistended. No masses. Positive bowel sounds. No hepatosplenomegaly.  MUSCULOSKELETAL: No swelling, clubbing, or edema. Range of motion full in all extremities.  NEUROLOGIC: Cranial nerves II through XII are intact. No gross focal neurological deficits. Sensation intact. Reflexes intact.  SKIN: No ulceration, lesions, rashes, or cyanosis. Skin warm and dry. Turgor intact.  PSYCHIATRIC: Mood, affect within normal limits. The patient is awake, alert and oriented x 3. Insight, judgment intact.        IMAGING   Narrative & Impression  CLINICAL DATA:  Atelectasis.   EXAM: PORTABLE CHEST 1 VIEW   COMPARISON:  Radiograph and CT 04/23/2023   FINDINGS: Tracheostomy tube tip at the thoracic inlet. Median sternotomy and prosthetic cardiac valve. Stable cardiomegaly. Improvement in right pleural effusion. Similar left pleural effusion and basilar opacity. Pulmonary edema is unchanged. No pneumothorax.   IMPRESSION: 1. Improvement in right pleural effusion. 2. Unchanged left pleural effusion and basilar opacity. 3. Unchanged pulmonary edema.       -pulm edema and bilateral effusions worse on left - CXR 04/29/23 - independent review     ASSESSMENT/PLAN     Acute on chronic hypoxemic respiratory failure Status post chest x-ray-interval changes with improved right lower lobe however left hemithorax with interstitial edema and atelectasis. -CRP is within reference range suggestive of absence of inflammatory changes in low likelihood of infectious or inflammatory pneumonia, there is a very mild leukocytosis 11.3 which may be due to underlying GI cholera.    Bibasilar atelectasis -Due to compressive effects with surrounding pleural effusion as well as chronic deconditioning and interstitial edema with development of atelectatic segments.  Patient should start chronic physical therapy and Occupational Therapy with pulmonary rehab on outpatient basis, in the interim recruitment maneuvers with MetaNeb device utilizing albuterol as well as utilization of chest physiotherapy with incentive spirometry each hour.  continue inpatient physical and occupational therapy Patient is also on DuoNeb nebulizer therapy which should also help treat atelectasis.   Interstitial edema -Nephrology is on case patient is currently being dialyzed -Complex fluid management due to ESRD Strict I&O fluid restricted diet    Thank you for allowing me to participate in the care of this patient.    Patient/Family are satisfied with care plan and all questions have been answered.    Provider disclosure: Patient with at least one acute or chronic illness or injury that poses a threat to life  or bodily function and is being managed actively during this encounter.  All of the below services have been performed independently by signing provider:  review of prior documentation from internal and or external health records.  Review of previous and current lab results.  Interview and comprehensive assessment during patient visit today. Review of current and previous chest radiographs/CT scans. Discussion of management and test interpretation with health care team and patient/family.   This document was prepared using Dragon voice recognition software and may include unintentional dictation errors.     Vida Rigger, M.D.  Division of Pulmonary & Critical Care Medicine

## 2023-05-06 ENCOUNTER — Inpatient Hospital Stay: Payer: 59

## 2023-05-06 DIAGNOSIS — J9621 Acute and chronic respiratory failure with hypoxia: Secondary | ICD-10-CM | POA: Diagnosis not present

## 2023-05-06 LAB — GLUCOSE, CAPILLARY
Glucose-Capillary: 139 mg/dL — ABNORMAL HIGH (ref 70–99)
Glucose-Capillary: 179 mg/dL — ABNORMAL HIGH (ref 70–99)
Glucose-Capillary: 184 mg/dL — ABNORMAL HIGH (ref 70–99)
Glucose-Capillary: 175 mg/dL — ABNORMAL HIGH (ref 70–99)

## 2023-05-06 LAB — STOOL CULTURE REFLEX - CMPCXR

## 2023-05-06 LAB — STOOL CULTURE

## 2023-05-06 NOTE — Progress Notes (Signed)
[facility]  - [place] [facility]   PATIENT NAME: Deborah [name]    MR#:  [id number]  DATE OF BIRTH:  01/17/1956  SUBJECTIVE:  patient sitting up in the bed eating BF. Overall stable Son franco in the room with RT. Discussed her oxygen requirement with son Patient remains stable on current oxygen VITALS:  Blood pressure 121/60, pulse 63, temperature 98.4 F (36.9 C), resp. rate 18, height 5\' 3"  (1.6 m), weight 64.6 kg, SpO2 96 %.  PHYSICAL EXAMINATION:   GENERAL:  67 y.o.-year-old patient with no acute distress.  LUNGS: Normal breath sounds bilaterally, no wheezing. Trach Collar+ CARDIOVASCULAR: S1, S2 normal. No murmur   ABDOMEN: Soft, nontender, nondistended. Bowel sounds present.  EXTREMITIES: No  edema b/l.   HD access+ NEUROLOGIC: nonfocal  patient is alert and awake  LABORATORY PANEL:  CBC Recent Labs  Lab 05/04/23 1626  WBC 9.1  HGB 7.8*  HCT 24.4*  PLT 316     Chemistries  Recent Labs  Lab 05/04/23 1626  NA 134*  K 4.6  CL 95*  CO2 27  GLUCOSE 237*  BUN 42*  CREATININE 8.69*  CALCIUM 8.7*    Assessment and Plan  Acute on chronic respiratory failure with hypoxia -Continue to require 10 L/min oxygen; usually less than 6 L/min at home -Chronic Tracheostomy status -CT chest showed vascular congestion/volume overload with acute on chronic diastolic heart failure -Nephrology consulted for dialysis -Swallow evaluation obtained, recommend regular consistency diet with thin liquids, aspiration precautions -Pulmonology consulted; started on inpatient physical and occupational therapy, and recommend outpatient PT/OT with pulmonary rehab.  Started on chest PT and incentive spirometry for bibasilar atelectasis.  -Chest x-ray obtained today shows unchanged left pleural effusion and basilar opacity, unchanged pulmonary edema, improvement right pleural effusion -Lasix 80 mg IV daily ordered per pulmonology --d/w Dr Rubye Beach down oxygen to  keep sats >89-90% -- discussed with respiratory therapist. Patient has asymptomatic and her sats intermittently drop in the 80s without any distress. Comes up in few seconds to minute.  --cont metaneb --6/1 d/w RT--per RT "The air entrainment device she is connected to in the room requires 10 liters of oxygen to produce 35% FIO2 of oxygen. If someone were on 10 lpm of oxygen nasal canula that would be 60% FIO2. There are many different kinds of entrainment devices. --currently pt is on 3-4 liter/min of her home equivalent oxygen. -6/2--pt remains stable -- Physical therapy evaluation noted. Home health PT will be arranged.  Community-acquired pneumonia -Had been on cefepime during previous hospital stay and was discharged on Levaquin; family never filled Levaquin prescription -Sputum culture also grew Corynebacterium species -ID was consult--pt was treated for  pseudomonas resp infection with cefepime for 10 days-total.    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 Rivka Safer -She recommends to obtain stool culture and treat with azithromycin 1 g p.o. x 1.  --improved    ESRD -Patient on hemodialysis   Mitral valve replacement -2D echo showed normal functioning valve, EF 55 to 60%   Paroxysmal atrial fibrillation -Continue Eliquis, Coreg   Diabetes mellitus type 2 with ESRD -Continue Levemir, sliding scale insulin with NovoLog -CBG well-controlled   Hypertension --Blood pressure is stable -- amlodipine, Coreg,irbesartan --takes midodrine on HD days  HHPT at discharge  Family communication :none today Consults : ID, pulmonary, nephrology CODE STATUS: full DVT Prophylaxis : eliquis Level of care: Progressive  Status is: Inpatient Remains inpatient appropriate because:will monitor over weekend and if stable d/c home Monday. D/w Dr Wynelle Link   TOTAL TIME TAKING CARE OF THIS PATIENT:  66 minutes.  >50% time spent on counselling and coordination of care  Note: This dictation was prepared with Dragon dictation along with smaller phrase technology. Any transcriptional errors that result from this process are unintentional.  Enedina Finner M.D    [facility]   CC: Primary care physician; Si Gaul, MD

## 2023-05-06 NOTE — Progress Notes (Signed)
PULMONOLOGY         Date: 05/06/2023,   MRN# [id number] Deborah [name] 1956-10-24     AdmissionWeight:  (Unable to weigh , Pt on stretcher, too short of breath to stand)                 CurrentWeight: 64.6 kg  Referring provider: Dr Sharl Ma   CHIEF COMPLAINT:   Acute on chronic hypoxemic respiratory failure   HISTORY OF PRESENT ILLNESS   This is a pleasant 67 year old female with a history of cardiac dysfunction including diastolic heart failure, chronic atrial fibrillation with atrial flutter, cardiogenic shock, metabolic syndrome with diabetes, dyslipidemia and ESRD with chronic hypertension and is currently receiving dialysis 3 times weekly Monday Wednesday Friday, does have chronic respiratory insufficiency with episodes of hypoxemia tracheostomy status currently requiring 10 L supplemental O2, this particular hospitalization complicated by increased O2 requirement with difficulty weaning.  PCCM consultation for further evaluation management.  05/01/23-  patient in bed on 10L/min trache collar, in no distress this am. CXR with pulm edema and pleural effusion worse on left.   05/02/23- patient on 10L/min 40% Fio2.  S/p ID evaluation no additional antimicrobials at this time. She does have significant atelectasis still and is using metaneb device to help treat this.  Difficult to wean due to fluid shifting with HD and pulmonary edema.  Gave lasix challenge yesterday but I&O not documented .  For HD today.  Repeat CXR today.   05/03/23- Patient s/p 1.5L UF removed via HD yesterday.   She continues to require 10L/min 40%FiO2.  She has not walked in 12 days.  She is generally ambulatory in outpatient dialysis center. We will initiate OT/PT.  Continue current abx.  Continue current Metaneb schedule.  She made no urine today despite lasix 80mg  so we will be solely dependent on strict I&O plus RRT.   05/04/23- patient with no changes overnight.  She went for dialysis today.  Patient did  have physical and occupational therapy which seemed to have helped her.  Her O2 has been weaned further to 8L/min.  She is weak and chronically deconditioned. Overall she seems to have recruited her lungs a bit better and we will continue metaneb therapy.   05/05/23- patient is weaned down to 6L/min 35%, she was sitting up during interview and was able to speak in full sentences reports reduced dyspnea. She is still rhonchorous on examination but improved overall.   05/06/23- Repeat CXR with findings of improved left lung opacification.  She is breathing easier.  She is on 9L/min [place] today.  Plan is essentially same to keep treating effusion, pneumonia, atelectasis and edema.    PAST MEDICAL HISTORY   Past Medical History:  Diagnosis Date   Acute on chronic diastolic CHF (congestive heart failure) ([name]) 01/04/2022   Atrial fibrillation ([name])    Atrial flutter ([name])    Cardiogenic shock ([name]) 08/17/2016   Diabetes mellitus    Diabetes mellitus type 2, controlled ([name]) 03/08/2012   ESRD (end stage renal disease) ([name])    Heart attack ([name])    Hyperlipidemia    Hypertension    MI, old    NSTEMI (non-ST elevated myocardial infarction) ([name])    Renal disorder    dialysls   Respiratory failure ([name]) 08/17/2016   ST elevation myocardial infarction involving left main coronary artery ([name])    STEMI (ST elevation myocardial infarction) ([name]) 08/15/2016   Type 2 diabetes mellitus with complication, with long-term current  use of insulin [facility])      SURGICAL HISTORY   Past Surgical History:  Procedure Laterality Date   A/V FISTULAGRAM N/A 10/05/2017   Procedure: A/V Fistulagram;  Surgeon: Renford Dills, MD;  Location: [facility] INVASIVE CV LAB;  Service: Cardiovascular;  Laterality: N/A;   A/V SHUNTOGRAM N/A 10/05/2017   Procedure: A/V SHUNTOGRAM;  Surgeon: Renford Dills, MD;  Location: [facility] INVASIVE CV LAB;  Service: Cardiovascular;  Laterality: N/A;   AV FISTULA PLACEMENT Left 10/02/2016    Procedure: INSERTION OF ARTERIOVENOUS (AV) GORE-TEX GRAFT ARM;  Surgeon: Maeola Harman, MD;  Location: [facility] OR;  Service: Vascular;  Laterality: Left;   CARDIAC CATHETERIZATION N/A 08/15/2016   Procedure: Left Heart Cath and Coronary Angiography;  Surgeon: Alwyn Pea, MD;  Location: [facility] INVASIVE CV LAB;  Service: Cardiovascular;  Laterality: N/A;   CARDIAC CATHETERIZATION N/A 08/15/2016   Procedure: Coronary Stent Intervention;  Surgeon: Alwyn Pea, MD;  Location: [facility] INVASIVE CV LAB;  Service: Cardiovascular;  Laterality: N/A;   CARDIAC CATHETERIZATION N/A 08/18/2016   Procedure: Right Heart Cath;  Surgeon: Dolores Patty, MD;  Location: [facility] INVASIVE CV LAB;  Service: Cardiovascular;  Laterality: N/A;   CARDIAC CATHETERIZATION N/A 08/18/2016   Procedure: IABP Insertion;  Surgeon: Dolores Patty, MD;  Location: [facility] INVASIVE CV LAB;  Service: Cardiovascular;  Laterality: N/A;   CARDIAC CATHETERIZATION Right 08/23/2016   Procedure: CENTRAL LINE INSERTION RIGHT SUBCLAVIAN;  Surgeon: Kerin Perna, MD;  Location[facility] OR;  Service: Open Heart Surgery;  Laterality: Right;   ENDOVEIN HARVEST OF GREATER SAPHENOUS VEIN Left 08/23/2016   Procedure: ENDOVEIN HARVEST OF GREATER SAPHENOUS VEIN;  Surgeon: Kerin Perna, MD;  Location: [facility] OR;  Service: Open Heart Surgery;  Laterality: Left;   INSERTION OF DIALYSIS CATHETER Bilateral 08/31/2016   Procedure: INSERTION OF DIALYSIS CATHETER LEFT INTERNAL JUGULAR VEIN & INSERTION OF TRIPLE LUMEN RIGHT INTERNAL JUGULAR VEIN;  Surgeon: Chuck Hint, MD;  Location: [facility] OR;  Service: Vascular;  Laterality: Bilateral;   INTRAOPERATIVE TRANSESOPHAGEAL ECHOCARDIOGRAM N/A 08/23/2016   Procedure: INTRAOPERATIVE TRANSESOPHAGEAL ECHOCARDIOGRAM;  Surgeon: Kerin Perna, MD;  Location: [facility] OR;  Service: Open Heart Surgery;  Laterality: N/A;   IR GENERIC HISTORICAL  11/13/2016   IR US GUIDE VASC ACCESS LEFT 11/13/2016 Gilmer Mor, DO [facility]   IR GENERIC HISTORICAL  11/13/2016   IR FLUORO GUIDE CV LINE LEFT 11/13/2016 Gilmer Mor, DO [facility]   IR GENERIC HISTORICAL  11/13/2016   IR GASTROSTOMY TUBE MOD SED 11/13/2016 Gilmer Mor, DO [facility]   MITRAL VALVE REPAIR N/A 08/23/2016   Procedure: MITRAL VALVE REPAIR (MVR) USING EDWARDS MAGNA EASE BIOPROSTHESIS MITRAL  VALVE;  Surgeon: Kerin Perna, MD;  Location: [facility] OR;  Service: Open Heart Surgery;  Laterality: N/A;   tracheostomy reversal     TRACHEOSTOMY TUBE PLACEMENT N/A 11/09/2016   Procedure: TRACHEOSTOMY;  Surgeon: Suzanna Obey, MD;  Location: [facility] OR;  Service: ENT;  Laterality: N/A;   VAGINAL DELIVERY     x 6     FAMILY HISTORY   Family History  Problem Relation Age of Onset   Hypertension Mother    Diabetes Mother    Breast cancer Sister    Hypertension Father      SOCIAL HISTORY   Social History   Tobacco Use   Smoking status: Never   Smokeless tobacco: Never  Substance Use Topics   Alcohol use: No   Drug use: No  MEDICATIONS    Home Medication:    Current Medication:  Current Facility-Administered Medications:    0.9 %  sodium chloride infusion, , Intravenous, PRN, Sharl Ma, Sarina Ill, MD, Last Rate: 5 mL/hr at 05/02/23 0600, Infusion Verify at 05/02/23 0600   acetaminophen (TYLENOL) tablet 650 mg, 650 mg, Oral, Q6H PRN, 650 mg at 05/03/23 2156 **OR** acetaminophen (TYLENOL) suppository 650 mg, 650 mg, Rectal, Q6H PRN, Sharl Ma, Sarina Ill, MD   apixaban (ELIQUIS) tablet 2.5 mg, 2.5 mg, Oral, BID, Sharl Ma, Gagan S, MD, 2.5 mg at 05/06/23 0815   aspirin EC tablet 81 mg, 81 mg, Oral, Daily, Sharl Ma, Gagan S, MD, 81 mg at 05/06/23 0815   atorvastatin (LIPITOR) tablet 80 mg, 80 mg, Oral, q1800, [name], MD, 80 mg at 05/05/23 2116   carvedilol (COREG) tablet 6.25 mg, 6.25 mg, Oral, BID WC, Enedina Finner, MD, 6.25 mg at 05/06/23 0815   Chlorhexidine Gluconate Cloth 2 % PADS 6 each, 6 each, Topical, Q0600, [name], MD, 6 each at 05/06/23  0457   epoetin alfa (EPOGEN) injection 10,000 Units, 10,000 Units, Intravenous, Q M,W,F-HD, Sharl Ma, Sarina Ill, MD, 10,000 Units at 05/04/23 1714   escitalopram (LEXAPRO) tablet 10 mg, 10 mg, Oral, Daily, Sharl Ma, Gagan S, MD, 10 mg at 05/06/23 0815   gabapentin (NEURONTIN) capsule 300 mg, 300 mg, Oral, QHS, Sharl Ma, Gagan S, MD, 300 mg at 05/05/23 2115   guaiFENesin-dextromethorphan (ROBITUSSIN DM) 100-10 MG/5ML syrup 15 mL, 15 mL, Oral, Q4H PRN, Sharl Ma, Gagan S, MD, 15 mL at 05/01/23 1445   insulin aspart (novoLOG) injection 0-5 Units, 0-5 Units, Subcutaneous, QHS, [name], MD, 2 Units at 05/05/23 2116   insulin aspart (novoLOG) injection 0-6 Units, 0-6 Units, Subcutaneous, TID WC, [name], MD, 1 Units at 05/05/23 1606   insulin detemir (LEVEMIR) injection 4 Units, 4 Units, Subcutaneous, BID, Sharl Ma, Sarina Ill, MD, 4 Units at 05/06/23 1610   ipratropium-albuterol (DUONEB) 0.5-2.5 (3) MG/3ML nebulizer solution 3 mL, 3 mL, Nebulization, TID, Sharl Ma, Sarina Ill, MD, 3 mL at 05/06/23 0731   irbesartan (AVAPRO) tablet 150 mg, 150 mg, Oral, Daily, Sharl Ma, Gagan S, MD, 150 mg at 05/06/23 0815   loperamide (IMODIUM) capsule 2 mg, 2 mg, Oral, PRN, [name], MD, 2 mg at 05/01/23 2121   midodrine (PROAMATINE) tablet 5 mg, 5 mg, Oral, Once per day on Mon Wed Fri, Lama, Gagan S, MD, 5 mg at 05/04/23 9604   multivitamin (RENA-VIT) tablet 1 tablet, 1 tablet, Oral, QHS, Sharl Ma, Gagan S, MD, 1 tablet at 05/05/23 2115   ondansetron (ZOFRAN) tablet 4 mg, 4 mg, Oral, Q6H PRN, 4 mg at 04/23/23 2346 **OR** ondansetron (ZOFRAN) injection 4 mg, 4 mg, Intravenous, Q6H PRN, [name], MD, 4 mg at 04/27/23 0043   pantoprazole (PROTONIX) EC tablet 40 mg, 40 mg, Oral, Daily, Sharl Ma, Gagan S, MD, 40 mg at 05/06/23 0815   sevelamer carbonate (RENVELA) tablet 1,600 mg, 1,600 mg, Oral, TID WC, Sharl Ma, Gagan S, MD, 1,600 mg at 05/06/23 0815   traZODone (DESYREL) tablet 50 mg, 50 mg, Oral, Once per day on Mon Wed Fri, Lama, Gagan S, MD, 50  mg at 05/04/23 2155    ALLERGIES   Patient has no known allergies.     REVIEW OF SYSTEMS    Review of Systems:  Gen:  Denies  fever, sweats, chills weigh loss  HEENT: Denies blurred vision, double vision, ear pain, eye pain, hearing loss, nose bleeds, sore throat Cardiac:  No  dizziness, chest pain or heaviness, chest tightness,edema Resp:   reports dyspnea chronically  Gi: Denies swallowing difficulty, stomach pain, nausea or vomiting, diarrhea, constipation, bowel incontinence Gu:  Denies bladder incontinence, burning urine Ext:   Denies Joint pain, stiffness or swelling Skin: Denies  skin rash, easy bruising or bleeding or hives Endoc:  Denies polyuria, polydipsia , polyphagia or weight change Psych:   Denies depression, insomnia or hallucinations   Other:  All other systems negative   VS: BP 126/65 (BP Location: Right Arm)   Pulse 67   Temp 98.2 F (36.8 C)   Resp (!) 22   Ht 5\' 3"  (1.6 m)   Wt 64.6 kg   SpO2 92%   BMI 25.23 kg/m      PHYSICAL EXAM    GENERAL:NAD, no fevers, chills, no weakness no fatigue HEAD: Normocephalic, atraumatic.  EYES: Pupils equal, round, reactive to light. Extraocular muscles intact. No scleral icterus.  MOUTH: Moist mucosal membrane. Dentition intact. No abscess noted.  EAR, NOSE, THROAT: Clear without exudates. No external lesions.  NECK: Supple. No thyromegaly. No nodules. No JVD.  PULMONARY: decreased breath sounds with mild rhonchi worse at bases bilaterally.  CARDIOVASCULAR: S1 and S2. Regular rate and rhythm. No murmurs, rubs, or gallops. No edema. Pedal pulses 2+ bilaterally.  GASTROINTESTINAL: Soft, nontender, nondistended. No masses. Positive bowel sounds. No hepatosplenomegaly.  MUSCULOSKELETAL: No swelling, clubbing, or edema. Range of motion full in all extremities.  NEUROLOGIC: Cranial nerves II through XII are intact. No gross focal neurological deficits. Sensation intact. Reflexes intact.  SKIN: No ulceration,  lesions, rashes, or cyanosis. Skin warm and dry. Turgor intact.  PSYCHIATRIC: Mood, affect within normal limits. The patient is awake, alert and oriented x 3. Insight, judgment intact.       IMAGING   Narrative & Impression  CLINICAL DATA:  Atelectasis.   EXAM: PORTABLE CHEST 1 VIEW   COMPARISON:  Radiograph and CT 04/23/2023   FINDINGS: Tracheostomy tube tip at the thoracic inlet. Median sternotomy and prosthetic cardiac valve. Stable cardiomegaly. Improvement in right pleural effusion. Similar left pleural effusion and basilar opacity. Pulmonary edema is unchanged. No pneumothorax.   IMPRESSION: 1. Improvement in right pleural effusion. 2. Unchanged left pleural effusion and basilar opacity. 3. Unchanged pulmonary edema.       -pulm edema and bilateral effusions worse on left - CXR 04/29/23 - independent review     ASSESSMENT/PLAN     Acute on chronic hypoxemic respiratory failure Status post chest x-ray-interval changes with improved right lower lobe however left hemithorax with interstitial edema and atelectasis. -CRP is within reference range suggestive of absence of inflammatory changes in low likelihood of infectious or inflammatory pneumonia, there is a very mild leukocytosis 11.3 which may be due to underlying GI cholera.    Bibasilar atelectasis -Due to compressive effects with surrounding pleural effusion as well as chronic deconditioning and interstitial edema with development of atelectatic segments.  Patient should start chronic physical therapy and Occupational Therapy with pulmonary rehab on outpatient basis, in the interim recruitment maneuvers with MetaNeb device utilizing albuterol as well as utilization of chest physiotherapy with incentive spirometry each hour.  continue inpatient physical and occupational therapy Patient is also on DuoNeb nebulizer therapy which should also help treat atelectasis.   Interstitial edema -Nephrology is on case  patient is currently being dialyzed -Complex fluid management due to ESRD Strict I&O fluid restricted diet    Thank you for allowing me to participate in the care of this  patient.   Patient/Family are satisfied with care plan and all questions have been answered.    Provider disclosure: Patient with at least one acute or chronic illness or injury that poses a threat to life or bodily function and is being managed actively during this encounter.  All of the below services have been performed independently by signing provider:  review of prior documentation from internal and or external health records.  Review of previous and current lab results.  Interview and comprehensive assessment during patient visit today. Review of current and previous chest radiographs/CT scans. Discussion of management and test interpretation with health care team and patient/family.   This document was prepared using Dragon voice recognition software and may include unintentional dictation errors.     Vida Rigger, M.D.  Division of Pulmonary & Critical Care Medicine

## 2023-05-06 NOTE — Progress Notes (Signed)
[facility]  ROUNDING NOTE   Subjective:   [name] returns to [facility] on 04/23/2023 for Acute on chronic respiratory failure with hypoxia ([name]) [J96.21] Acute on chronic diastolic (congestive) heart failure ([name]) [I50.33]  Son at bedside.   Patient states her cough and breathing has improved.   Objective:  Vital signs in last 24 hours:  Temp:  [98.2 F (36.8 C)-98.6 F (37 C)] 98.4 F (36.9 C) (06/02 1205) Pulse Rate:  [63-74] 63 (06/02 1205) Resp:  [17-22] 18 (06/02 1205) BP: (112-126)/(55-65) 121/60 (06/02 1205) SpO2:  [92 %-99 %] 94 % (06/02 1300) FiO2 (%):  [35 %] 35 % (06/02 1300)  Weight change:  Filed Weights   05/03/23 0823 05/04/23 1617 05/04/23 1811  Weight: 64.4 kg 65.4 kg 64.6 kg    Intake/Output: No intake/output data recorded.   Intake/Output this shift:  No intake/output data recorded.  Physical Exam: General: NAD  Head: Normocephalic, atraumatic. Moist oral mucosal membranes  Eyes: Anicteric  Neck: +tracheostomy  Lungs:  Diminished in bases  bilaterally  Heart: Regular rate and rhythm  Abdomen:  Soft, nontender  Extremities: No peripheral edema.  Neurologic: Nonfocal, moving all four extremities  Skin: No lesions  Access: Left AVG    Basic Metabolic Panel: Recent Labs  Lab 04/30/23 1000 05/02/23 0934 05/04/23 1626  NA 135 138 134*  K 3.8 4.3 4.6  CL 98 98 95*  CO2 24 29 27   GLUCOSE 150* 153* 237*  BUN 51* 45* 42*  CREATININE 10.47* 8.79* 8.69*  CALCIUM 8.7* 9.0 8.7*  PHOS 4.3 4.6 5.2*     Liver Function Tests: Recent Labs  Lab 04/30/23 1000 05/02/23 0934 05/04/23 1626  ALBUMIN 2.9* 3.0* 3.0*    No results for input(s): "LIPASE", "AMYLASE" in the last 168 hours. No results for input(s): "AMMONIA" in the last 168 hours.  CBC: Recent Labs  Lab 04/30/23 1000 05/04/23 1626  WBC 11.3* 9.1  HGB 7.6* 7.8*  HCT 23.0* 24.4*  MCV 92.0 95.3  PLT 245 316     Cardiac Enzymes: No results for input(s):  "CKTOTAL", "CKMB", "CKMBINDEX", "TROPONINI" in the last 168 hours.  BNP: Invalid input(s): "POCBNP"  CBG: Recent Labs  Lab 05/05/23 1137 05/05/23 1550 05/05/23 2108 05/06/23 0800 05/06/23 1201  GLUCAP 159* 196* 245* 139* 179*     Microbiology: Results for orders placed or performed during the hospital encounter of 04/23/23  Organism ID, Bacteria     Status: None (Preliminary result)   Collection Time: 04/18/23  8:20 PM  Result Value Ref Range Status   Organism ID, Bacteria REFERT  Final    Comment: (NOTE) Please refer to the following specimen for additional lab results. Please refer to spec # [id number] for test result. Camelia Moraru notified 05-02-2023 at 13:00 Performed At: [facility] [address] [place], [place] [id number] Jolene Schimke MD GN:[id number]    Source of Sample PENDING  Incomplete  Bacterial organism reflex     Status: None   Collection Time: 04/18/23  8:20 PM  Result Value Ref Range Status   Bacterial result 1 Comment  Final    Comment: (NOTE) Specimen has been received and testing has been initiated. Performed At: [facility] [address] [place], [place] [id number] Jolene Schimke MD WU:[id number]   Organism ID by MALDI     Status: None (Preliminary result)   Collection Time: 04/18/23  8:20 PM  Result Value Ref Range Status   Organism ID by MALDI Comment  Final    Comment: (NOTE) Specimen has been received. Testing has been initiated. Performed At: [facility] [address] [place], [place] [id number] Jolene Schimke MD WU:[id number]    Source (MTB RIF) PENDING  Incomplete  Aerobic ID by MALDI     Status: None   Collection Time: 04/18/23  8:20 PM  Result Value Ref Range Status   Aerobic ID by MALDI Comment  Final    Comment: (NOTE) Unable to identify by MALDI. See Organism ID by Sequencing. Performed At: [facility] [address] [place], [place] [id number] Jolene Schimke MD VH:[id number]    SARS Coronavirus 2 by RT PCR (hospital order, performed in [facility] hospital lab) *cepheid single result test* Anterior Nasal Swab     Status: None   Collection Time: 04/23/23  9:30 AM   Specimen: Anterior Nasal Swab  Result Value Ref Range Status   SARS Coronavirus 2 by RT PCR NEGATIVE NEGATIVE Final    Comment: (NOTE) SARS-CoV-2 target nucleic acids are NOT DETECTED.  The SARS-CoV-2 RNA is generally detectable in upper and lower respiratory specimens during the acute phase of infection. The lowest concentration of SARS-CoV-2 viral copies this assay can detect is 250 copies / mL. A negative result does not preclude SARS-CoV-2 infection and should not be used as the sole basis for treatment or other patient management decisions.  A negative result may occur with improper specimen collection / handling, submission of specimen other than nasopharyngeal swab, presence of viral mutation(s) within the areas targeted by this assay, and inadequate number of viral copies (<250 copies / mL). A negative result must be combined with clinical observations, patient history, and epidemiological information.  Fact Sheet for Patients:   RoadLapTop.co.za  Fact Sheet for Healthcare Providers: http://kim-miller.com/  This test is not yet approved or  cleared by the [place] FDA and has been authorized for detection and/or diagnosis of SARS-CoV-2 by FDA under an Emergency Use Authorization (EUA).  This EUA will remain in effect (meaning this test can be used) for the duration of the COVID-19 declaration under Section 564(b)(1) of the Act, 21 U.S.C. section 360bbb-3(b)(1), unless the authorization is terminated or revoked sooner.  Performed at [facility], [address] [place], [place] [address]   Culture, blood (Routine X 2) w Reflex to ID Panel     Status: None   Collection Time: 04/24/23  7:52 PM   Specimen: BLOOD  Result Value  Ref Range Status   Specimen Description BLOOD RIGHT ANTECUBITAL  Final   Special Requests   Final    BOTTLES DRAWN AEROBIC AND ANAEROBIC Blood Culture adequate volume   Culture   Final    NO GROWTH 5 DAYS Performed at [facility], [address] [place], [place] [address]    Report Status 04/29/2023 FINAL  Final  Culture, blood (Routine X 2) w Reflex to ID Panel     Status: None   Collection Time: 04/24/23  7:56 PM   Specimen: BLOOD  Result Value Ref Range Status   Specimen Description BLOOD RIGHT ANTECUBITAL  Final   Special Requests   Final    BOTTLES DRAWN AEROBIC AND ANAEROBIC Blood Culture adequate volume   Culture   Final    NO GROWTH 5 DAYS Performed at [facility], [address] [place], [place] [address]    Report Status 04/29/2023 FINAL  Final  Respiratory (~20 pathogens) panel by PCR     Status: None   Collection Time:  04/25/23  2:01 PM   Specimen: Nasopharyngeal Swab; Respiratory  Result Value Ref Range Status   Adenovirus NOT DETECTED NOT DETECTED Final   Coronavirus 229E NOT DETECTED NOT DETECTED Final    Comment: (NOTE) The Coronavirus on the Respiratory Panel, DOES NOT test for the novel  Coronavirus (2019 nCoV)    Coronavirus HKU1 NOT DETECTED NOT DETECTED Final   Coronavirus NL63 NOT DETECTED NOT DETECTED Final   Coronavirus OC43 NOT DETECTED NOT DETECTED Final   Metapneumovirus NOT DETECTED NOT DETECTED Final   Rhinovirus / Enterovirus NOT DETECTED NOT DETECTED Final   Influenza A NOT DETECTED NOT DETECTED Final   Influenza B NOT DETECTED NOT DETECTED Final   Parainfluenza Virus 1 NOT DETECTED NOT DETECTED Final   Parainfluenza Virus 2 NOT DETECTED NOT DETECTED Final   Parainfluenza Virus 3 NOT DETECTED NOT DETECTED Final   Parainfluenza Virus 4 NOT DETECTED NOT DETECTED Final   Respiratory Syncytial Virus NOT DETECTED NOT DETECTED Final   Bordetella pertussis NOT DETECTED NOT DETECTED Final   Bordetella Parapertussis NOT  DETECTED NOT DETECTED Final   Chlamydophila pneumoniae NOT DETECTED NOT DETECTED Final   Mycoplasma pneumoniae NOT DETECTED NOT DETECTED Final    Comment: Performed at [facility], [address], [place], [place] [address]  Gastrointestinal Panel by PCR , Stool     Status: Abnormal   Collection Time: 04/26/23  6:56 AM   Specimen: Stool  Result Value Ref Range Status   Campylobacter species NOT DETECTED NOT DETECTED Final   Plesimonas shigelloides NOT DETECTED NOT DETECTED Final   Salmonella species NOT DETECTED NOT DETECTED Final   Yersinia enterocolitica NOT DETECTED NOT DETECTED Final   Vibrio species DETECTED (A) NOT DETECTED Final    Comment: RESULT CALLED TO, READ BACK BY AND VERIFIED WITH: JOSEPH HAYES AT 9147 04/29/23.PMF    Vibrio cholerae DETECTED (A) NOT DETECTED Final    Comment: RESULT CALLED TO, READ BACK BY AND VERIFIED WITH: JOSEPH HAYES AT 8295 04/29/23.PMF    Enteroaggregative E coli (EAEC) NOT DETECTED NOT DETECTED Final   Enteropathogenic E coli (EPEC) NOT DETECTED NOT DETECTED Final   Enterotoxigenic E coli (ETEC) NOT DETECTED NOT DETECTED Final   Shiga like toxin producing E coli (STEC) NOT DETECTED NOT DETECTED Final   Shigella/Enteroinvasive E coli (EIEC) NOT DETECTED NOT DETECTED Final   Cryptosporidium NOT DETECTED NOT DETECTED Final   Cyclospora cayetanensis NOT DETECTED NOT DETECTED Final   Entamoeba histolytica NOT DETECTED NOT DETECTED Final   Giardia lamblia NOT DETECTED NOT DETECTED Final   Adenovirus F40/41 NOT DETECTED NOT DETECTED Final   Astrovirus NOT DETECTED NOT DETECTED Final   Norovirus GI/GII NOT DETECTED NOT DETECTED Final   Rotavirus A NOT DETECTED NOT DETECTED Final   Sapovirus (I, II, IV, and V) NOT DETECTED NOT DETECTED Final    Comment: Performed at [facility], [address] [place], [place] [address]  Stool culture     Status: None (Preliminary result)   Collection Time: 05/01/23  8:51 PM   Specimen: Stool   Result Value Ref Range Status   Salmonella/Shigella Screen Final report  Final   Campylobacter Culture PENDING  Incomplete   E coli, Shiga toxin Assay Negative Negative Final    Comment: (NOTE) Performed At: [facility] [address] [place], [place] [id number] Jolene Schimke MD EX:[id number]   STOOL CULTURE REFLEX - RSASHR     Status: None   Collection Time: 05/01/23  8:51 PM  Result  Value Ref Range Status   Stool Culture result 1 (RSASHR) Comment  Final    Comment: (NOTE) No Salmonella or Shigella recovered. Performed At: [facility] [address] [place], [place] [id number] Jolene Schimke MD WU:[id number]     Coagulation Studies: No results for input(s): "LABPROT", "INR" in the last 72 hours.   Urinalysis: No results for input(s): "COLORURINE", "LABSPEC", "PHURINE", "GLUCOSEU", "HGBUR", "BILIRUBINUR", "KETONESUR", "PROTEINUR", "UROBILINOGEN", "NITRITE", "LEUKOCYTESUR" in the last 72 hours.  Invalid input(s): "APPERANCEUR"    Imaging: DG Chest Port 1 View  Result Date: 05/06/2023 CLINICAL DATA:  Pleural effusion on the left EXAM: PORTABLE CHEST 1 VIEW COMPARISON:  May 02, 2023 FINDINGS: No pneumothorax. Stable tracheostomy tube. Stable cardiomediastinal silhouette. Bilateral interstitial opacities with more focal opacities in the bases. The findings are stable other than improving opacity in the left base. No other acute abnormalities. IMPRESSION: 1. Cardiomegaly with mild edema, stable in the interval. 2. Improving opacity in the left base. Recommend follow-up to complete resolution. Electronically Signed   By: Gerome Sam III M.D.   On: 05/06/2023 12:45     Medications:    sodium chloride 5 mL/hr at 05/02/23 0600     apixaban  2.5 mg Oral BID   aspirin EC  81 mg Oral Daily   atorvastatin  80 mg Oral q1800   carvedilol  6.25 mg Oral BID WC   Chlorhexidine Gluconate Cloth  6 each Topical Q0600   epoetin (EPOGEN/PROCRIT) injection  10,000 Units  Intravenous Q M,W,F-HD   escitalopram  10 mg Oral Daily   gabapentin  300 mg Oral QHS   insulin aspart  0-5 Units Subcutaneous QHS   insulin aspart  0-6 Units Subcutaneous TID WC   insulin detemir  4 Units Subcutaneous BID   ipratropium-albuterol  3 mL Nebulization TID   irbesartan  150 mg Oral Daily   midodrine  5 mg Oral Once per day on Mon Wed Fri   multivitamin  1 tablet Oral QHS   pantoprazole  40 mg Oral Daily   sevelamer carbonate  1,600 mg Oral TID WC   traZODone  50 mg Oral Once per day on Mon Wed Fri   sodium chloride, acetaminophen **OR** acetaminophen, guaiFENesin-dextromethorphan, loperamide, ondansetron **OR** ondansetron (ZOFRAN) IV  Assessment/ Plan:  Deborah [name] is a 67 y.o.  female with end stage renal disease on hemodialysis, tracheostomy, hypertension, hyperlipidemia, diabetes mellitus type II insulin dependent, depression, and diabetic neuropathy who presents to [facility] for 04/23/2023 for Acute on chronic respiratory failure with hypoxia ([name]) [J96.21] Acute on chronic diastolic (congestive) heart failure ([name]) [I50.33]  [facility] MWF [address] Left AVG 62kg  End Stage Kidney Disease: continue MWF schedule. Dialysis for tomorrow. Orders prepared.   Hypertension with chronic kidney disease: current regimen of irbesartan, carvedilol and amlodipine. Home regimen of olmesartan instead of irbesartan. Also prescribed Midodrine to prevent hypotension during dialysis.    Diabetes mellitus type II with chronic kidney disease: insulin dependent.  - continue glucose control  Secondary Hyperparathyroidism - Continue sevelamer with meals  Anemia of chronic kidney disease: -Continue EPO 10,000 units with dialysis   LOS: 12 Britaney Espaillat 6/2/20242:37 PM

## 2023-05-06 NOTE — Progress Notes (Signed)
Mobility Specialist - Progress Note   Pre-mobility: HR, BP, SpO2 During mobility: HR, BP, SpO2 Post-mobility: HR, BP, SPO2     05/06/23 1014  Mobility  Activity Ambulated with assistance in hallway  Level of Assistance Standby assist, set-up cues, supervision of patient - no hands on  Assistive Device Front wheel walker  Distance Ambulated (ft) 210 ft  Range of Motion/Exercises Active  Activity Response Tolerated well  Mobility Referral Yes  $Mobility charge 1 Mobility  Mobility Specialist Start Time (ACUTE ONLY) [id number]  Mobility Specialist Stop Time (ACUTE ONLY) 1014  Mobility Specialist Time Calculation (min) (ACUTE ONLY) 32 min   Pt resting in bed with tracheostomy collar on 10L upon entry. Pt STS and ambulates to hallway around NS (Bpod) with RW and chair follow SBA. O2 stat dropped to 82%; Pt took 5 minute seated rest break after 140 ft to recover. Pt recovered to 98% and resumed ambulation for 70 more ft. Pt returned to bed and left with needs in reach and bed alarm activated.   Johnathan Hausen Mobility Specialist 05/06/23, 10:26 AM

## 2023-05-07 DIAGNOSIS — J9621 Acute and chronic respiratory failure with hypoxia: Secondary | ICD-10-CM | POA: Diagnosis not present

## 2023-05-07 LAB — GLUCOSE, CAPILLARY
Glucose-Capillary: 136 mg/dL — ABNORMAL HIGH (ref 70–99)
Glucose-Capillary: 180 mg/dL — ABNORMAL HIGH (ref 70–99)
Glucose-Capillary: 156 mg/dL — ABNORMAL HIGH (ref 70–99)

## 2023-05-07 NOTE — TOC Progression Note (Addendum)
Transition of Care [facility]) - Progression Note    Patient Details  Name: Deborah [name] MRN: [id number] Date of Birth: [birth year]  [facility] CM/SW Contact  Truddie Hidden, RN Phone Number: 05/07/2023, 3:02 PM  Clinical Narrative:    Attempt to reach [employer] regarding respiratory needs for patient while transporting to HD. No one able to be reached at [phone].  2:50pm Attempt to reach [employer] at [phone]. Unable to reach a representative. Call pending representative over 10 mins.   4:00pm Spoke with patient's son, Lynnell Chad. He tried to contact [employer] as well unsuccessfully. Patients son agreeable to referral to another DME agency.   4:17pm Spoke with Thomasenia Sales from [facility] to update on discharge readiness. She provided number of DME for [facility] that may be more beneficial for patient.    Expected Discharge Plan: Home w Home Health Services Barriers to Discharge: Continued Medical Work up  Expected Discharge Plan and Services     Post Acute Care Choice: Resumption of Svcs/PTA Provider Living arrangements for the past 2 months: Apartment                                       Social Determinants of Health (SDOH) Interventions SDOH Screenings   Food Insecurity: No Food Insecurity (04/24/2023)  Housing: Patient Declined (04/24/2023)  Transportation Needs: No Transportation Needs (04/24/2023)  Utilities: Not At Risk (04/24/2023)  Tobacco Use: Low Risk  (04/24/2023)    Readmission Risk Interventions    04/27/2023    4:18 PM 11/04/2020    9:20 AM  Readmission Risk Prevention Plan  Transportation Screening Complete Complete  PCP or Specialist Appt within 3-5 Days Complete   HRI or Home Care Consult Complete   Social Work Consult for Recovery Care Planning/Counseling Complete   Palliative Care Screening Not Applicable   Medication Review Oceanographer) Complete   PCP or Specialist appointment within 3-5 days of discharge  Complete  SW Recovery  Care/Counseling Consult  Complete  Palliative Care Screening  Not Applicable  Skilled Nursing Facility  Not Applicable

## 2023-05-07 NOTE — Progress Notes (Signed)
[facility]  ROUNDING NOTE   Subjective:   [name] returns to [facility] on 04/23/2023 for Acute on chronic respiratory failure with hypoxia ([name]) [J96.21] Acute on chronic diastolic (congestive) heart failure ([name]) [I50.33]  Patient seen sitting up in bed States she feels better today Less frequent productive cough  Remains on 10 L   Objective:  Vital signs in last 24 hours:  Temp:  [98.3 F (36.8 C)-99.6 F (37.6 C)] 98.3 F (36.8 C) (06/03 1139) Pulse Rate:  [66-70] 68 (06/03 1139) Resp:  [16-20] 18 (06/03 1139) BP: (114-136)/(58-71) 126/63 (06/03 1139) SpO2:  [92 %-100 %] 92 % (06/03 1355) FiO2 (%):  [35 %] 35 % (06/03 0801)  Weight change:  Filed Weights   05/03/23 0823 05/04/23 1617 05/04/23 1811  Weight: 64.4 kg 65.4 kg 64.6 kg    Intake/Output: No intake/output data recorded.   Intake/Output this shift:  Total I/O In: 240 [P.O.:240] Out: -   Physical Exam: General: NAD  Head: Normocephalic, atraumatic. Moist oral mucosal membranes  Eyes: Anicteric  Neck: +tracheostomy  Lungs:  Diminished in bases  bilaterally  Heart: Regular rate and rhythm  Abdomen:  Soft, nontender  Extremities: No peripheral edema.  Neurologic: Nonfocal, moving all four extremities  Skin: No lesions  Access: Left AVG    Basic Metabolic Panel: Recent Labs  Lab 05/02/23 0934 05/04/23 1626  NA 138 134*  K 4.3 4.6  CL 98 95*  CO2 29 27  GLUCOSE 153* 237*  BUN 45* 42*  CREATININE 8.79* 8.69*  CALCIUM 9.0 8.7*  PHOS 4.6 5.2*     Liver Function Tests: Recent Labs  Lab 05/02/23 0934 05/04/23 1626  ALBUMIN 3.0* 3.0*    No results for input(s): "LIPASE", "AMYLASE" in the last 168 hours. No results for input(s): "AMMONIA" in the last 168 hours.  CBC: Recent Labs  Lab 05/04/23 1626  WBC 9.1  HGB 7.8*  HCT 24.4*  MCV 95.3  PLT 316     Cardiac Enzymes: No results for input(s): "CKTOTAL", "CKMB", "CKMBINDEX", "TROPONINI" in the last 168  hours.  BNP: Invalid input(s): "POCBNP"  CBG: Recent Labs  Lab 05/06/23 1201 05/06/23 1628 05/06/23 2153 05/07/23 0753 05/07/23 1143  GLUCAP 179* 184* 175* 136* 180*     Microbiology: Results for orders placed or performed during the hospital encounter of 04/23/23  Organism ID, Bacteria     Status: None (Preliminary result)   Collection Time: 04/18/23  8:20 PM  Result Value Ref Range Status   Organism ID, Bacteria REFERT  Final    Comment: (NOTE) Please refer to the following specimen for additional lab results. Please refer to spec # [id number] for test result. Camelia Moraru notified 05-02-2023 at 13:00 Performed At: [facility] [address] [place], [place] [id number] Jolene Schimke MD ZH:[id number]    Source of Sample PENDING  Incomplete  Bacterial organism reflex     Status: None   Collection Time: 04/18/23  8:20 PM  Result Value Ref Range Status   Bacterial result 1 Comment  Final    Comment: (NOTE) Specimen has been received and testing has been initiated. Performed At: [facility] [address] [place], [place] [id number] Jolene Schimke MD GM:[id number]   Organism ID by MALDI     Status: None (Preliminary result)   Collection Time: 04/18/23  8:20 PM  Result Value Ref Range Status   Organism ID by MALDI Comment  Final    Comment: (NOTE) Specimen has been received.  Testing has been initiated. Performed At: [facility] [address] [place], [place] [id number] Jolene Schimke MD XB:[id number]    Source (MTB RIF) PENDING  Incomplete  Aerobic ID by MALDI     Status: None   Collection Time: 04/18/23  8:20 PM  Result Value Ref Range Status   Aerobic ID by MALDI Comment  Final    Comment: (NOTE) Unable to identify by MALDI. See Organism ID by Sequencing. Performed At: [facility] [address] [place], [place] [id number] Jolene Schimke MD [id number]   SARS Coronavirus 2 by RT PCR (hospital order, performed in  [facility] hospital lab) *cepheid single result test* Anterior Nasal Swab     Status: None   Collection Time: 04/23/23  9:30 AM   Specimen: Anterior Nasal Swab  Result Value Ref Range Status   SARS Coronavirus 2 by RT PCR NEGATIVE NEGATIVE Final    Comment: (NOTE) SARS-CoV-2 target nucleic acids are NOT DETECTED.  The SARS-CoV-2 RNA is generally detectable in upper and lower respiratory specimens during the acute phase of infection. The lowest concentration of SARS-CoV-2 viral copies this assay can detect is 250 copies / mL. A negative result does not preclude SARS-CoV-2 infection and should not be used as the sole basis for treatment or other patient management decisions.  A negative result may occur with improper specimen collection / handling, submission of specimen other than nasopharyngeal swab, presence of viral mutation(s) within the areas targeted by this assay, and inadequate number of viral copies (<250 copies / mL). A negative result must be combined with clinical observations, patient history, and epidemiological information.  Fact Sheet for Patients:   RoadLapTop.co.za  Fact Sheet for Healthcare Providers: http://kim-miller.com/  This test is not yet approved or  cleared by the [place] FDA and has been authorized for detection and/or diagnosis of SARS-CoV-2 by FDA under an Emergency Use Authorization (EUA).  This EUA will remain in effect (meaning this test can be used) for the duration of the COVID-19 declaration under Section 564(b)(1) of the Act, 21 U.S.C. section 360bbb-3(b)(1), unless the authorization is terminated or revoked sooner.  Performed at [facility], [address] [place], [place] [address]   Culture, blood (Routine X 2) w Reflex to ID Panel     Status: None   Collection Time: 04/24/23  7:52 PM   Specimen: BLOOD  Result Value Ref Range Status   Specimen Description BLOOD RIGHT  ANTECUBITAL  Final   Special Requests   Final    BOTTLES DRAWN AEROBIC AND ANAEROBIC Blood Culture adequate volume   Culture   Final    NO GROWTH 5 DAYS Performed at [facility], [address], [place], [place] [address]    Report Status 04/29/2023 FINAL  Final  Culture, blood (Routine X 2) w Reflex to ID Panel     Status: None   Collection Time: 04/24/23  7:56 PM   Specimen: BLOOD  Result Value Ref Range Status   Specimen Description BLOOD RIGHT ANTECUBITAL  Final   Special Requests   Final    BOTTLES DRAWN AEROBIC AND ANAEROBIC Blood Culture adequate volume   Culture   Final    NO GROWTH 5 DAYS Performed at [facility], [address] [place], [place] [address]    Report Status 04/29/2023 FINAL  Final  Respiratory (~20 pathogens) panel by PCR     Status: None   Collection Time: 04/25/23  2:01 PM   Specimen: Nasopharyngeal Swab; Respiratory  Result Value Ref Range Status   Adenovirus NOT DETECTED NOT DETECTED Final   Coronavirus 229E NOT DETECTED NOT DETECTED Final    Comment: (NOTE) The Coronavirus on the Respiratory Panel, DOES NOT test for the novel  Coronavirus (2019 nCoV)    Coronavirus HKU1 NOT DETECTED NOT DETECTED Final   Coronavirus NL63 NOT DETECTED NOT DETECTED Final   Coronavirus OC43 NOT DETECTED NOT DETECTED Final   Metapneumovirus NOT DETECTED NOT DETECTED Final   Rhinovirus / Enterovirus NOT DETECTED NOT DETECTED Final   Influenza A NOT DETECTED NOT DETECTED Final   Influenza B NOT DETECTED NOT DETECTED Final   Parainfluenza Virus 1 NOT DETECTED NOT DETECTED Final   Parainfluenza Virus 2 NOT DETECTED NOT DETECTED Final   Parainfluenza Virus 3 NOT DETECTED NOT DETECTED Final   Parainfluenza Virus 4 NOT DETECTED NOT DETECTED Final   Respiratory Syncytial Virus NOT DETECTED NOT DETECTED Final   Bordetella pertussis NOT DETECTED NOT DETECTED Final   Bordetella Parapertussis NOT DETECTED NOT DETECTED Final   Chlamydophila pneumoniae NOT  DETECTED NOT DETECTED Final   Mycoplasma pneumoniae NOT DETECTED NOT DETECTED Final    Comment: Performed at [facility], [address] [place], [place] [address]  Gastrointestinal Panel by PCR , Stool     Status: Abnormal   Collection Time: 04/26/23  6:56 AM   Specimen: Stool  Result Value Ref Range Status   Campylobacter species NOT DETECTED NOT DETECTED Final   Plesimonas shigelloides NOT DETECTED NOT DETECTED Final   Salmonella species NOT DETECTED NOT DETECTED Final   Yersinia enterocolitica NOT DETECTED NOT DETECTED Final   Vibrio species DETECTED (A) NOT DETECTED Final    Comment: RESULT CALLED TO, READ BACK BY AND VERIFIED WITH: JOSEPH HAYES AT 6295 04/29/23.PMF    Vibrio cholerae DETECTED (A) NOT DETECTED Final    Comment: RESULT CALLED TO, READ BACK BY AND VERIFIED WITH: JOSEPH HAYES AT 2841 04/29/23.PMF    Enteroaggregative E coli (EAEC) NOT DETECTED NOT DETECTED Final   Enteropathogenic E coli (EPEC) NOT DETECTED NOT DETECTED Final   Enterotoxigenic E coli (ETEC) NOT DETECTED NOT DETECTED Final   Shiga like toxin producing E coli (STEC) NOT DETECTED NOT DETECTED Final   Shigella/Enteroinvasive E coli (EIEC) NOT DETECTED NOT DETECTED Final   Cryptosporidium NOT DETECTED NOT DETECTED Final   Cyclospora cayetanensis NOT DETECTED NOT DETECTED Final   Entamoeba histolytica NOT DETECTED NOT DETECTED Final   Giardia lamblia NOT DETECTED NOT DETECTED Final   Adenovirus F40/41 NOT DETECTED NOT DETECTED Final   Astrovirus NOT DETECTED NOT DETECTED Final   Norovirus GI/GII NOT DETECTED NOT DETECTED Final   Rotavirus A NOT DETECTED NOT DETECTED Final   Sapovirus (I, II, IV, and V) NOT DETECTED NOT DETECTED Final    Comment: Performed at [facility], [address] [place], [place] [address]  Stool culture     Status: None   Collection Time: 05/01/23  8:51 PM   Specimen: Stool  Result Value Ref Range Status   Salmonella/Shigella Screen Final report  Final    Campylobacter Culture Final report  Final   E coli, Shiga toxin Assay Negative Negative Final    Comment: (NOTE) Performed At: [facility] [address] [place], [place] [id number] Jolene Schimke MD YQ:[id number]   STOOL CULTURE REFLEX - RSASHR     Status: None   Collection Time: 05/01/23  8:51 PM  Result Value Ref Range Status   Stool Culture result 1 (RSASHR) Comment  Final    Comment: (NOTE) No Salmonella or Shigella recovered. Performed At: [facility] [address] [place], [place] [id number] Jolene Schimke MD WU:[id number]   STOOL CULTURE Reflex - CMPCXR     Status: None   Collection Time: 05/01/23  8:51 PM  Result Value Ref Range Status   Stool Culture result 1 (CMPCXR) Comment  Final    Comment: (NOTE) No Campylobacter species isolated. Performed At: [facility] [address] [place], [place] [id number] Jolene Schimke MD VH:[id number]     Coagulation Studies: No results for input(s): "LABPROT", "INR" in the last 72 hours.   Urinalysis: No results for input(s): "COLORURINE", "LABSPEC", "PHURINE", "GLUCOSEU", "HGBUR", "BILIRUBINUR", "KETONESUR", "PROTEINUR", "UROBILINOGEN", "NITRITE", "LEUKOCYTESUR" in the last 72 hours.  Invalid input(s): "APPERANCEUR"    Imaging: DG Chest Port 1 View  Result Date: 05/06/2023 CLINICAL DATA:  Pleural effusion on the left EXAM: PORTABLE CHEST 1 VIEW COMPARISON:  May 02, 2023 FINDINGS: No pneumothorax. Stable tracheostomy tube. Stable cardiomediastinal silhouette. Bilateral interstitial opacities with more focal opacities in the bases. The findings are stable other than improving opacity in the left base. No other acute abnormalities. IMPRESSION: 1. Cardiomegaly with mild edema, stable in the interval. 2. Improving opacity in the left base. Recommend follow-up to complete resolution. Electronically Signed   By: Gerome Sam III M.D.   On: 05/06/2023 12:45     Medications:    sodium chloride 5 mL/hr at  05/02/23 0600     apixaban  2.5 mg Oral BID   aspirin EC  81 mg Oral Daily   atorvastatin  80 mg Oral q1800   carvedilol  6.25 mg Oral BID WC   Chlorhexidine Gluconate Cloth  6 each Topical Q0600   epoetin (EPOGEN/PROCRIT) injection  10,000 Units Intravenous Q M,W,F-HD   escitalopram  10 mg Oral Daily   gabapentin  300 mg Oral QHS   insulin aspart  0-5 Units Subcutaneous QHS   insulin aspart  0-6 Units Subcutaneous TID WC   insulin detemir  4 Units Subcutaneous BID   ipratropium-albuterol  3 mL Nebulization TID   irbesartan  150 mg Oral Daily   midodrine  5 mg Oral Once per day on Mon Wed Fri   multivitamin  1 tablet Oral QHS   pantoprazole  40 mg Oral Daily   sevelamer carbonate  1,600 mg Oral TID WC   traZODone  50 mg Oral Once per day on Mon Wed Fri   sodium chloride, acetaminophen **OR** acetaminophen, guaiFENesin-dextromethorphan, loperamide, ondansetron **OR** ondansetron (ZOFRAN) IV  Assessment/ Plan:  [name] is a 67 y.o.  female with end stage renal disease on hemodialysis, tracheostomy, hypertension, hyperlipidemia, diabetes mellitus type II insulin dependent, depression, and diabetic neuropathy who presents to [facility] for 04/23/2023 for Acute on chronic respiratory failure with hypoxia ([name]) [J96.21] Acute on chronic diastolic (congestive) heart failure ([name]) [I50.33]  [facility] MWF [address] Left AVG 62kg  End Stage Kidney Disease: continue MWF schedule.  Dialysis scheduled for later today.  Next treatment scheduled for Wednesday.  Hypertension with chronic kidney disease: current regimen of irbesartan, carvedilol and amlodipine. Home regimen of olmesartan instead of irbesartan.  Midodrine available for dialysis.  Diabetes mellitus type II with chronic kidney disease: insulin dependent.  - continue glucose control - Primary team to manage sliding scale insulin.  Secondary Hyperparathyroidism -Bone minerals acceptable at last check - Awaiting  labs to be drawn with dialysis later today - Continue sevelamer with meals  Anemia  of chronic kidney disease: -Continue EPO 10,000 units with dialysis   LOS: 13   6/3/20242:38 PM

## 2023-05-07 NOTE — Progress Notes (Signed)
PULMONOLOGY         Date: 05/07/2023,   MRN# [id number] Deborah [name] Apr 13, 1956     AdmissionWeight:  (Unable to weigh , Pt on stretcher, too short of breath to stand)                 CurrentWeight: 64.6 kg  Referring provider: Dr Sharl Ma   CHIEF COMPLAINT:   Acute on chronic hypoxemic respiratory failure   HISTORY OF PRESENT ILLNESS   This is a pleasant 67 year old female with a history of cardiac dysfunction including diastolic heart failure, chronic atrial fibrillation with atrial flutter, cardiogenic shock, metabolic syndrome with diabetes, dyslipidemia and ESRD with chronic hypertension and is currently receiving dialysis 3 times weekly Monday Wednesday Friday, does have chronic respiratory insufficiency with episodes of hypoxemia tracheostomy status currently requiring 10 L supplemental O2, this particular hospitalization complicated by increased O2 requirement with difficulty weaning.  PCCM consultation for further evaluation management.  05/01/23-  patient in bed on 10L/min trache collar, in no distress this am. CXR with pulm edema and pleural effusion worse on left.    05/07/23- patient for HD today.  She is working with PT/OT.  She seems to have less labored breathing and auscultation is clear today.   PAST MEDICAL HISTORY   Past Medical History:  Diagnosis Date   Acute on chronic diastolic CHF (congestive heart failure) ([name]) 01/04/2022   Atrial fibrillation ([name])    Atrial flutter ([name])    Cardiogenic shock ([name]) 08/17/2016   Diabetes mellitus    Diabetes mellitus type 2, controlled ([name]) 03/08/2012   ESRD (end stage renal disease) ([name])    Heart attack ([name])    Hyperlipidemia    Hypertension    MI, old    NSTEMI (non-ST elevated myocardial infarction) ([name])    Renal disorder    dialysls   Respiratory failure ([name]) 08/17/2016   ST elevation myocardial infarction involving left main coronary artery ([name])    STEMI (ST elevation myocardial infarction)  ([name]) 08/15/2016   Type 2 diabetes mellitus with complication, with long-term current use of insulin [facility])      SURGICAL HISTORY   Past Surgical History:  Procedure Laterality Date   A/V FISTULAGRAM N/A 10/05/2017   Procedure: A/V Fistulagram;  Surgeon: Renford Dills, MD;  Location: [facility] INVASIVE CV LAB;  Service: Cardiovascular;  Laterality: N/A;   A/V SHUNTOGRAM N/A 10/05/2017   Procedure: A/V SHUNTOGRAM;  Surgeon: Renford Dills, MD;  Location: [facility] INVASIVE CV LAB;  Service: Cardiovascular;  Laterality: N/A;   AV FISTULA PLACEMENT Left 10/02/2016   Procedure: INSERTION OF ARTERIOVENOUS (AV) GORE-TEX GRAFT ARM;  Surgeon: Maeola Harman, MD;  Location: [facility] OR;  Service: Vascular;  Laterality: Left;   CARDIAC CATHETERIZATION N/A 08/15/2016   Procedure: Left Heart Cath and Coronary Angiography;  Surgeon: Alwyn Pea, MD;  Location: [facility] INVASIVE CV LAB;  Service: Cardiovascular;  Laterality: N/A;   CARDIAC CATHETERIZATION N/A 08/15/2016   Procedure: Coronary Stent Intervention;  Surgeon: Alwyn Pea, MD;  Location: [facility] INVASIVE CV LAB;  Service: Cardiovascular;  Laterality: N/A;   CARDIAC CATHETERIZATION N/A 08/18/2016   Procedure: Right Heart Cath;  Surgeon: Dolores Patty, MD;  Location: [facility] INVASIVE CV LAB;  Service: Cardiovascular;  Laterality: N/A;   CARDIAC CATHETERIZATION N/A 08/18/2016   Procedure: IABP Insertion;  Surgeon: Dolores Patty, MD;  Location: [facility] INVASIVE CV LAB;  Service: Cardiovascular;  Laterality: N/A;   CARDIAC CATHETERIZATION Right 08/23/2016  Procedure: CENTRAL LINE INSERTION RIGHT SUBCLAVIAN;  Surgeon: Kerin Perna, MD;  Location: [facility] OR;  Service: Open Heart Surgery;  Laterality: Right;   ENDOVEIN HARVEST OF GREATER SAPHENOUS VEIN Left 08/23/2016   Procedure: ENDOVEIN HARVEST OF GREATER SAPHENOUS VEIN;  Surgeon: Kerin Perna, MD;  Location: [facility] OR;  Service: Open Heart Surgery;  Laterality: Left;   INSERTION OF DIALYSIS  CATHETER Bilateral 08/31/2016   Procedure: INSERTION OF DIALYSIS CATHETER LEFT INTERNAL JUGULAR VEIN & INSERTION OF TRIPLE LUMEN RIGHT INTERNAL JUGULAR VEIN;  Surgeon: Chuck Hint, MD;  Location: [facility] OR;  Service: Vascular;  Laterality: Bilateral;   INTRAOPERATIVE TRANSESOPHAGEAL ECHOCARDIOGRAM N/A 08/23/2016   Procedure: INTRAOPERATIVE TRANSESOPHAGEAL ECHOCARDIOGRAM;  Surgeon: Kerin Perna, MD;  Location: [facility] OR;  Service: Open Heart Surgery;  Laterality: N/A;   IR GENERIC HISTORICAL  11/13/2016   IR US GUIDE VASC ACCESS LEFT 11/13/2016 Gilmer Mor, DO [facility]   IR GENERIC HISTORICAL  11/13/2016   IR FLUORO GUIDE CV LINE LEFT 11/13/2016 Gilmer Mor, DO [facility]   IR GENERIC HISTORICAL  11/13/2016   IR GASTROSTOMY TUBE MOD SED 11/13/2016 Gilmer Mor, DO [facility]   MITRAL VALVE REPAIR N/A 08/23/2016   Procedure: MITRAL VALVE REPAIR (MVR) USING EDWARDS MAGNA EASE BIOPROSTHESIS MITRAL  VALVE;  Surgeon: Kerin Perna, MD;  Location: [facility] OR;  Service: Open Heart Surgery;  Laterality: N/A;   tracheostomy reversal     TRACHEOSTOMY TUBE PLACEMENT N/A 11/09/2016   Procedure: TRACHEOSTOMY;  Surgeon: Suzanna Obey, MD;  Location: [facility] OR;  Service: ENT;  Laterality: N/A;   VAGINAL DELIVERY     x 6     FAMILY HISTORY   Family History  Problem Relation Age of Onset   Hypertension Mother    Diabetes Mother    Breast cancer Sister    Hypertension Father      SOCIAL HISTORY   Social History   Tobacco Use   Smoking status: Never   Smokeless tobacco: Never  Substance Use Topics   Alcohol use: No   Drug use: No     MEDICATIONS    Home Medication:    Current Medication:  Current Facility-Administered Medications:    0.9 %  sodium chloride infusion, , Intravenous, PRN, [name], MD, Last Rate: 5 mL/hr at 05/02/23 0600, Infusion Verify at 05/02/23 0600   acetaminophen (TYLENOL) tablet 650 mg, 650 mg, Oral, Q6H PRN, 650 mg at 05/03/23 2156 **OR**  acetaminophen (TYLENOL) suppository 650 mg, 650 mg, Rectal, Q6H PRN, Sharl Ma, Sarina Ill, MD   apixaban (ELIQUIS) tablet 2.5 mg, 2.5 mg, Oral, BID, Sharl Ma, Gagan S, MD, 2.5 mg at 05/07/23 0956   aspirin EC tablet 81 mg, 81 mg, Oral, Daily, Sharl Ma, Gagan S, MD, 81 mg at 05/07/23 0956   atorvastatin (LIPITOR) tablet 80 mg, 80 mg, Oral, q1800, [name], MD, 80 mg at 05/06/23 2109   carvedilol (COREG) tablet 6.25 mg, 6.25 mg, Oral, BID WC, Enedina Finner, MD, 6.25 mg at 05/06/23 1654   Chlorhexidine Gluconate Cloth 2 % PADS 6 each, 6 each, Topical, Q0600, [name], MD, 6 each at 05/07/23 0516   epoetin alfa (EPOGEN) injection 10,000 Units, 10,000 Units, Intravenous, Q M,W,F-HD, Sharl Ma, Sarina Ill, MD, 10,000 Units at 05/04/23 1714   escitalopram (LEXAPRO) tablet 10 mg, 10 mg, Oral, Daily, Sharl Ma, Sarina Ill, MD, 10 mg at 05/06/23 0815   gabapentin (NEURONTIN) capsule 300 mg, 300 mg, Oral, QHS, Sharl Ma, Sarina Ill, MD, 300 mg at  05/06/23 2109   guaiFENesin-dextromethorphan (ROBITUSSIN DM) 100-10 MG/5ML syrup 15 mL, 15 mL, Oral, Q4H PRN, Sharl Ma, Gagan S, MD, 15 mL at 05/01/23 1445   insulin aspart (novoLOG) injection 0-5 Units, 0-5 Units, Subcutaneous, QHS, Sharl Ma, Sarina Ill, MD, 2 Units at 05/05/23 2116   insulin aspart (novoLOG) injection 0-6 Units, 0-6 Units, Subcutaneous, TID WC, Sharl Ma, Sarina Ill, MD, 1 Units at 05/06/23 1654   insulin detemir (LEVEMIR) injection 4 Units, 4 Units, Subcutaneous, BID, Sharl Ma, Sarina Ill, MD, 4 Units at 05/07/23 0957   ipratropium-albuterol (DUONEB) 0.5-2.5 (3) MG/3ML nebulizer solution 3 mL, 3 mL, Nebulization, TID, Sharl Ma, Sarina Ill, MD, 3 mL at 05/07/23 0753   irbesartan (AVAPRO) tablet 150 mg, 150 mg, Oral, Daily, Sharl Ma, Gagan S, MD, 150 mg at 05/06/23 0815   loperamide (IMODIUM) capsule 2 mg, 2 mg, Oral, PRN, [name], MD, 2 mg at 05/01/23 2121   midodrine (PROAMATINE) tablet 5 mg, 5 mg, Oral, Once per day on Mon Wed Fri, Lama, Gagan S, MD, 5 mg at 05/07/23 0981   multivitamin (RENA-VIT) tablet 1  tablet, 1 tablet, Oral, QHS, Sharl Ma, Gagan S, MD, 1 tablet at 05/06/23 2110   ondansetron (ZOFRAN) tablet 4 mg, 4 mg, Oral, Q6H PRN, 4 mg at 04/23/23 2346 **OR** ondansetron (ZOFRAN) injection 4 mg, 4 mg, Intravenous, Q6H PRN, Sharl Ma, Sarina Ill, MD, 4 mg at 04/27/23 0043   pantoprazole (PROTONIX) EC tablet 40 mg, 40 mg, Oral, Daily, Sharl Ma, Gagan S, MD, 40 mg at 05/07/23 0956   sevelamer carbonate (RENVELA) tablet 1,600 mg, 1,600 mg, Oral, TID WC, Sharl Ma, Gagan S, MD, 1,600 mg at 05/07/23 0955   traZODone (DESYREL) tablet 50 mg, 50 mg, Oral, Once per day on Mon Wed Fri, Lama, Gagan S, MD, 50 mg at 05/04/23 2155    ALLERGIES   Patient has no known allergies.     REVIEW OF SYSTEMS    Review of Systems:  Gen:  Denies  fever, sweats, chills weigh loss  HEENT: Denies blurred vision, double vision, ear pain, eye pain, hearing loss, nose bleeds, sore throat Cardiac:  No dizziness, chest pain or heaviness, chest tightness,edema Resp:   reports dyspnea chronically  Gi: Denies swallowing difficulty, stomach pain, nausea or vomiting, diarrhea, constipation, bowel incontinence Gu:  Denies bladder incontinence, burning urine Ext:   Denies Joint pain, stiffness or swelling Skin: Denies  skin rash, easy bruising or bleeding or hives Endoc:  Denies polyuria, polydipsia , polyphagia or weight change Psych:   Denies depression, insomnia or hallucinations   Other:  All other systems negative   VS: BP 130/67 (BP Location: Right Arm)   Pulse 66   Temp 98.3 F (36.8 C) (Oral)   Resp 16   Ht 5\' 3"  (1.6 m)   Wt 64.6 kg   SpO2 94%   BMI 25.23 kg/m      PHYSICAL EXAM    GENERAL:NAD, no fevers, chills, no weakness no fatigue HEAD: Normocephalic, atraumatic.  EYES: Pupils equal, round, reactive to light. Extraocular muscles intact. No scleral icterus.  MOUTH: Moist mucosal membrane. Dentition intact. No abscess noted.  EAR, NOSE, THROAT: Clear without exudates. No external lesions.  NECK: Supple. No  thyromegaly. No nodules. No JVD.  PULMONARY: decreased breath sounds with mild rhonchi worse at bases bilaterally.  CARDIOVASCULAR: S1 and S2. Regular rate and rhythm. No murmurs, rubs, or gallops. No edema. Pedal pulses 2+ bilaterally.  GASTROINTESTINAL: Soft, nontender, nondistended. No masses. Positive bowel sounds. No hepatosplenomegaly.  MUSCULOSKELETAL: No swelling,  clubbing, or edema. Range of motion full in all extremities.  NEUROLOGIC: Cranial nerves II through XII are intact. No gross focal neurological deficits. Sensation intact. Reflexes intact.  SKIN: No ulceration, lesions, rashes, or cyanosis. Skin warm and dry. Turgor intact.  PSYCHIATRIC: Mood, affect within normal limits. The patient is awake, alert and oriented x 3. Insight, judgment intact.       IMAGING   Narrative & Impression  CLINICAL DATA:  Atelectasis.   EXAM: PORTABLE CHEST 1 VIEW   COMPARISON:  Radiograph and CT 04/23/2023   FINDINGS: Tracheostomy tube tip at the thoracic inlet. Median sternotomy and prosthetic cardiac valve. Stable cardiomegaly. Improvement in right pleural effusion. Similar left pleural effusion and basilar opacity. Pulmonary edema is unchanged. No pneumothorax.   IMPRESSION: 1. Improvement in right pleural effusion. 2. Unchanged left pleural effusion and basilar opacity. 3. Unchanged pulmonary edema.       -pulm edema and bilateral effusions worse on left - CXR 04/29/23 - independent review     ASSESSMENT/PLAN     Acute on chronic hypoxemic respiratory failure Status post chest x-ray-interval changes with improved right lower lobe however left hemithorax with interstitial edema and atelectasis. -CRP is within reference range suggestive of absence of inflammatory changes in low likelihood of infectious or inflammatory pneumonia, there is a very mild leukocytosis 11.3 which may be due to underlying GI cholera.    Bibasilar atelectasis -Due to compressive effects with  surrounding pleural effusion as well as chronic deconditioning and interstitial edema with development of atelectatic segments.  Patient should start chronic physical therapy and Occupational Therapy with pulmonary rehab on outpatient basis, in the interim recruitment maneuvers with MetaNeb device utilizing albuterol as well as utilization of chest physiotherapy with incentive spirometry each hour.  continue inpatient physical and occupational therapy Patient is also on DuoNeb nebulizer therapy which should also help treat atelectasis.   Interstitial edema -Nephrology is on case patient is currently being dialyzed -Complex fluid management due to ESRD Strict I&O fluid restricted diet    Thank you for allowing me to participate in the care of this patient.   Patient/Family are satisfied with care plan and all questions have been answered.    Provider disclosure: Patient with at least one acute or chronic illness or injury that poses a threat to life or bodily function and is being managed actively during this encounter.  All of the below services have been performed independently by signing provider:  review of prior documentation from internal and or external health records.  Review of previous and current lab results.  Interview and comprehensive assessment during patient visit today. Review of current and previous chest radiographs/CT scans. Discussion of management and test interpretation with health care team and patient/family.   This document was prepared using Dragon voice recognition software and may include unintentional dictation errors.     Vida Rigger, M.D.  Division of Pulmonary & Critical Care Medicine

## 2023-05-07 NOTE — Progress Notes (Signed)
[facility]  - [place] [facility]   PATIENT NAME: Deborah [name]    MR#:  [id number]  DATE OF BIRTH:  1956-07-28  SUBJECTIVE:  patient sitting up in the bed eating BF. Overall stable Patient remains stable on current oxygen Pt's dialysis now scheduled in the afternoon. Will have to wait for d/c till am VITALS:  Blood pressure 126/63, pulse 68, temperature 98.3 F (36.8 C), temperature source Oral, resp. rate 18, height 5\' 3"  (1.6 m), weight 64.6 kg, SpO2 92 %.  PHYSICAL EXAMINATION:   GENERAL:  67 y.o.-year-old patient with no acute distress.  LUNGS: Normal breath sounds bilaterally, no wheezing. Trach Collar+ CARDIOVASCULAR: S1, S2 normal. No murmur   ABDOMEN: Soft, nontender, nondistended. Bowel sounds present.  EXTREMITIES: No  edema b/l.   HD access+ NEUROLOGIC: nonfocal  patient is alert and awake  LABORATORY PANEL:  CBC Recent Labs  Lab 05/04/23 1626  WBC 9.1  HGB 7.8*  HCT 24.4*  PLT 316     Chemistries  Recent Labs  Lab 05/04/23 1626  NA 134*  K 4.6  CL 95*  CO2 27  GLUCOSE 237*  BUN 42*  CREATININE 8.69*  CALCIUM 8.7*    Assessment and Plan  Acute on chronic respiratory failure with hypoxia -Continue to require 10 L/min oxygen; usually less than 6 L/min at home -Chronic Tracheostomy status -CT chest showed vascular congestion/volume overload with acute on chronic diastolic heart failure -Nephrology consulted for dialysis -Swallow evaluation obtained, recommend regular consistency diet with thin liquids, aspiration precautions -Pulmonology consulted; started on inpatient physical and occupational therapy, and recommend outpatient PT/OT with pulmonary rehab.  Started on chest PT and incentive spirometry for bibasilar atelectasis.  -Chest x-ray obtained today shows unchanged left pleural effusion and basilar opacity, unchanged pulmonary edema, improvement right pleural effusion -Lasix 80 mg IV daily ordered per pulmonology --d/w Dr  Rubye Beach down oxygen to keep sats >89-90% -- discussed with respiratory therapist. Patient has asymptomatic and her sats intermittently drop in the 80s without any distress. Comes up in few seconds to minute.  --cont metaneb --6/1 d/w RT--per RT "The air entrainment device she is connected to in the room requires 10 liters of oxygen to produce 35% FIO2 of oxygen. If someone were on 10 lpm of oxygen nasal canula that would be 60% FIO2. There are many different kinds of entrainment devices. --currently pt is on 3-4 liter/min of her home equivalent oxygen. -6/2--pt remains stable -- Physical therapy evaluation noted. Home health PT will be arranged.  Community-acquired pneumonia -Had been on cefepime during previous hospital stay and was discharged on Levaquin; family never filled Levaquin prescription -Sputum culture also grew Corynebacterium species -ID was consult--pt was treated for  pseudomonas resp infection with cefepime for 10 days-total.    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 Rivka Safer -She recommends to obtain stool culture and treat with azithromycin 1 g p.o. x 1.  --improved    ESRD -Patient on hemodialysis   Mitral valve replacement -2D echo showed normal functioning valve, EF 55 to 60%   Paroxysmal atrial fibrillation -Continue Eliquis, Coreg   Diabetes mellitus type 2 with ESRD -Continue Levemir, sliding scale insulin with NovoLog -CBG well-controlled   Hypertension --Blood pressure is stable -- amlodipine, Coreg,irbesartan --takes midodrine on HD days  HHPT at discharge  Family communication :none today Consults : ID, pulmonary, nephrology CODE STATUS: full DVT Prophylaxis :  eliquis Level of care: Progressive Status is: Inpatient Remains inpatient appropriate because: patient HD got moved to the afternoon by dialysis nurse and hence will have to wait  for discharge tomorrow since patient will be going home with EMS  TOTAL TIME TAKING CARE OF THIS PATIENT: 35 minutes.  >50% time spent on counselling and coordination of care  Note: This dictation was prepared with Dragon dictation along with smaller phrase technology. Any transcriptional errors that result from this process are unintentional.  Enedina Finner M.D    [facility]   CC: Primary care physician; Si Gaul, MD

## 2023-05-07 NOTE — Discharge Planning (Signed)
ESTBLISHED HEMODIALYSIS Outpatient Facility  [facility]  [address]  [place] [address] [phone]  Scheduled days: Monday Wednesday and Friday  Treatment time: 10:15am  Please note: Clinic cannot take patient back if requiring more than 6lt of O2 via trach.

## 2023-05-07 NOTE — Progress Notes (Signed)
Received patient in bed to unit.    Informed consent signed and in chart.    TX duration:3hr     Transported By hospital transport back to room Hand-off given to patient's nurse. Curtis Sites RN   Access used:L AVF  Access issues: none   Total UF removed: 1.5 Medication(s) given:none Post HD VS: Stable Post HD weight:36.4kg     [facility]

## 2023-05-07 NOTE — Progress Notes (Signed)
Physical Therapy Treatment Patient Details Name: MANHATTAN CONSTANCIO MRN: [id number] DOB: 07/19/1956 Today's Date: 05/07/2023   History of Present Illness 67 year old African-American female history of end-stage renal disease on dialysis Monday Wednesday Friday, paroxysmal atrial fibrillation on Eliquis, history of tracheostomy, chronic diastolic heart failure, chronic hypoxia at night, uncontrolled type 2 diabetes, hypertension, presents the ER today with worsening cough and hypoxia.  Patient was recently discharged the hospital yesterday after spending 5 days in the hospital with pneumonia. Workup for fluid overload, CHF exacerbation.    PT Comments    Pt upright in the bed upon arrival to the room.  Pt given O2 via trach collar throughout the session.  Pt able to come into standing position with supervision and then progressed to utilizing the walker to ambulate around the nursing pod.  Pt required a seated rest break before ambulating around the pod again, but demonstrates good mechanics during gait.  Pt returned to the bed and nursing arrived to give pt medication.  Pt left with all needs met and call bell within reach.    Recommendations for follow up therapy are one component of a multi-disciplinary discharge planning process, led by the attending physician.  Recommendations may be updated based on patient status, additional functional criteria and insurance authorization.     Assistance Recommended at Discharge Intermittent Supervision/Assistance  Patient can return home with the following Assistance with cooking/housework;Assist for transportation;Help with stairs or ramp for entrance;A little help with walking and/or transfers   Equipment Recommendations  None recommended by PT    Recommendations for Other Services       Precautions / Restrictions Precautions Precautions: Fall Restrictions Weight Bearing Restrictions: No     Mobility  Bed Mobility Overal bed mobility: Needs  Assistance Bed Mobility: Supine to Sit     Supine to sit: Supervision          Transfers Overall transfer level: Needs assistance Equipment used: Rolling walker (2 wheels) Transfers: Sit to/from Stand Sit to Stand: Supervision                Ambulation/Gait Ambulation/Gait assistance: Supervision Gait Distance (Feet): 240 Feet Assistive device: Rolling walker (2 wheels) Gait Pattern/deviations: WFL(Within Functional Limits), Step-through pattern, Trunk flexed Gait velocity: decreased     General Gait Details: 1 seated rest break between bouts   Stairs             Wheelchair Mobility    Modified Rankin (Stroke Patients Only)       Balance Overall balance assessment: Needs assistance Sitting-balance support: No upper extremity supported, Feet supported Sitting balance-Leahy Scale: Fair     Standing balance support: Single extremity supported, Bilateral upper extremity supported, During functional activity, Reliant on assistive device for balance Standing balance-Leahy Scale: Fair                              Cognition Arousal/Alertness: Awake/alert Behavior During Therapy: WFL for tasks assessed/performed Overall Cognitive Status: Within Functional Limits for tasks assessed                                          Exercises      General Comments General comments (skin integrity, edema, etc.): SpO2 dropped to 82% at lowest due to O2 falling off trach collar.  Corrected and saturation levels rose >90% within a  few seconds.      Pertinent Vitals/Pain Pain Assessment Pain Assessment: No/denies pain    Home Living                          Prior Function            PT Goals (current goals can now be found in the care plan section) Acute Rehab PT Goals Patient Stated Goal: to go home PT Goal Formulation: With patient Time For Goal Achievement: 05/17/23 Potential to Achieve Goals: Good Progress  towards PT goals: Progressing toward goals    Frequency    Min 2X/week      PT Plan Current plan remains appropriate    Co-evaluation              AM-PAC PT "6 Clicks" Mobility   Outcome Measure  Help needed turning from your back to your side while in a flat bed without using bedrails?: A Little Help needed moving from lying on your back to sitting on the side of a flat bed without using bedrails?: A Little Help needed moving to and from a bed to a chair (including a wheelchair)?: None Help needed standing up from a chair using your arms (e.g., wheelchair or bedside chair)?: None Help needed to walk in hospital room?: None Help needed climbing 3-5 steps with a railing? : A Little 6 Click Score: 21    End of Session Equipment Utilized During Treatment: Oxygen;Other (comment)   Patient left: with call bell/phone within reach;in bed;with bed alarm set Nurse Communication: Mobility status PT Visit Diagnosis: Other abnormalities of gait and mobility (R26.89);Muscle weakness (generalized) (M62.81)     Time: 1610-9604 PT Time Calculation (min) (ACUTE ONLY): 34 min  Charges:  $Gait Training: 23-37 mins                     Nolon Bussing, PT, DPT Physical Therapist - [place]  [facility]  05/07/23, 2:50 PM

## 2023-05-08 DIAGNOSIS — J9621 Acute and chronic respiratory failure with hypoxia: Secondary | ICD-10-CM | POA: Diagnosis not present

## 2023-05-08 LAB — GLUCOSE, CAPILLARY
Glucose-Capillary: 135 mg/dL — ABNORMAL HIGH (ref 70–99)
Glucose-Capillary: 235 mg/dL — ABNORMAL HIGH (ref 70–99)
Glucose-Capillary: 172 mg/dL — ABNORMAL HIGH (ref 70–99)
Glucose-Capillary: 133 mg/dL — ABNORMAL HIGH (ref 70–99)

## 2023-05-08 NOTE — TOC Progression Note (Addendum)
Transition of Care [facility]) - Progression Note    Patient Details  Name: Deborah [name] MRN: [id number] Date of Birth: 04-27-1956  Transition of Care [facility]) CM/SW Contact  Margarito Liner, LCSW Phone Number: 05/08/2023, 10:32 AM  Clinical Narrative:  CSW called and spoke to Sherrie at [facility]. After speaking with her, MD notified that the 10 L here at the hospital is equivalent to the 3-4 L she uses at home. CSW called Sherrie back on her direct line [phone]) and left her a voicemail to see if CSW could give son this contact information or if she could call him. CSW asked Sherrie to call CSW back to confirm she received message.   11:02 am: Sherrie with [employer] will call son to figure out what the issue is with her portable oxygen tank. She will call CSW back with outcome.  Expected Discharge Plan: Home w Home Health Services Barriers to Discharge: Continued Medical Work up  Expected Discharge Plan and Services     Post Acute Care Choice: Resumption of Svcs/PTA Provider Living arrangements for the past 2 months: Apartment                                       Social Determinants of Health (SDOH) Interventions SDOH Screenings   Food Insecurity: No Food Insecurity (04/24/2023)  Housing: Patient Declined (04/24/2023)  Transportation Needs: No Transportation Needs (04/24/2023)  Utilities: Not At Risk (04/24/2023)  Tobacco Use: Low Risk  (04/24/2023)    Readmission Risk Interventions    04/27/2023    4:18 PM 11/04/2020    9:20 AM  Readmission Risk Prevention Plan  Transportation Screening Complete Complete  PCP or Specialist Appt within 3-5 Days Complete   HRI or Home Care Consult Complete   Social Work Consult for Recovery Care Planning/Counseling Complete   Palliative Care Screening Not Applicable   Medication Review [employer]) Complete   PCP or Specialist appointment within 3-5 days of discharge  Complete  SW Recovery Care/Counseling Consult  Complete   Palliative Care Screening  Not Applicable  Skilled Nursing Facility  Not Applicable

## 2023-05-08 NOTE — Progress Notes (Signed)
Mobility Specialist - Progress Note   05/08/23 1600  Mobility  Activity Refused mobility     2nd attempt today. Pt declined mobility, states she would just a day to rest. Will attempt another date/time.    Deborah [name] Mobility Specialist 05/08/23, 4:20 PM

## 2023-05-08 NOTE — Progress Notes (Signed)
[facility]  ROUNDING NOTE   Subjective:   [name] returns to [facility] on 04/23/2023 for Acute on chronic respiratory failure with hypoxia ([name]) [J96.21] Acute on chronic diastolic (congestive) heart failure ([name]) [I50.33]  Patient seen sitting up in bed States she is feeling well today Oxygen requirements at baseline  Objective:  Vital signs in last 24 hours:  Temp:  [97.9 F (36.6 C)-99.2 F (37.3 C)] 98.5 F (36.9 C) (06/04 1311) Pulse Rate:  [64-77] 66 (06/04 1311) Resp:  [11-19] 18 (06/04 1311) BP: (122-155)/(53-71) 130/53 (06/04 1311) SpO2:  [92 %-100 %] 100 % (06/04 1311) FiO2 (%):  [35 %] 35 % (06/04 1346) Weight:  [63.4 kg] 63.4 kg (06/03 1850)  Weight change:  Filed Weights   05/04/23 1811 05/07/23 1449 05/07/23 1850  Weight: 64.6 kg 65.1 kg 63.4 kg    Intake/Output: I/O last 3 completed shifts: In: 240 [P.O.:240] Out: 1500 [Other:1500]   Intake/Output this shift:  No intake/output data recorded.  Physical Exam: General: NAD  Head: Normocephalic, atraumatic. Moist oral mucosal membranes  Eyes: Anicteric  Neck: +tracheostomy  Lungs:  Diminished in bases  bilaterally  Heart: Regular rate and rhythm  Abdomen:  Soft, nontender  Extremities: No peripheral edema.  Neurologic: Nonfocal, moving all four extremities  Skin: No lesions  Access: Left AVG    Basic Metabolic Panel: Recent Labs  Lab 05/02/23 0934 05/04/23 1626  NA 138 134*  K 4.3 4.6  CL 98 95*  CO2 29 27  GLUCOSE 153* 237*  BUN 45* 42*  CREATININE 8.79* 8.69*  CALCIUM 9.0 8.7*  PHOS 4.6 5.2*     Liver Function Tests: Recent Labs  Lab 05/02/23 0934 05/04/23 1626  ALBUMIN 3.0* 3.0*    No results for input(s): "LIPASE", "AMYLASE" in the last 168 hours. No results for input(s): "AMMONIA" in the last 168 hours.  CBC: Recent Labs  Lab 05/04/23 1626  WBC 9.1  HGB 7.8*  HCT 24.4*  MCV 95.3  PLT 316     Cardiac Enzymes: No results for input(s):  "CKTOTAL", "CKMB", "CKMBINDEX", "TROPONINI" in the last 168 hours.  BNP: Invalid input(s): "POCBNP"  CBG: Recent Labs  Lab 05/07/23 0753 05/07/23 1143 05/07/23 2102 05/08/23 0742 05/08/23 1316  GLUCAP 136* 180* 156* 135* 235*     Microbiology: Results for orders placed or performed during the hospital encounter of 04/23/23  Organism ID, Bacteria     Status: None (Preliminary result)   Collection Time: 04/18/23  8:20 PM  Result Value Ref Range Status   Organism ID, Bacteria REFERT  Final    Comment: (NOTE) Please refer to the following specimen for additional lab results. Please refer to spec # [id number] for test result. Camelia Moraru notified 05-02-2023 at 13:00 Performed At: [facility] [address] [place], [place] [id number] Jolene Schimke MD GN:[id number]    Source of Sample PENDING  Incomplete  Bacterial organism reflex     Status: None   Collection Time: 04/18/23  8:20 PM  Result Value Ref Range Status   Bacterial result 1 Comment  Final    Comment: (NOTE) Specimen has been received and testing has been initiated. Performed At: [facility] [address] [place], [place] [id number] Jolene Schimke MD WU:[id number]   Organism ID by MALDI     Status: None (Preliminary result)   Collection Time: 04/18/23  8:20 PM  Result Value Ref Range Status   Organism ID by MALDI Comment  Final  Comment: (NOTE) Specimen has been received. Testing has been initiated. Performed At: [facility] [address] [place], [place] [id number] Jolene Schimke MD WU:[id number]    Source (MTB RIF) PENDING  Incomplete  Aerobic ID by MALDI     Status: None   Collection Time: 04/18/23  8:20 PM  Result Value Ref Range Status   Aerobic ID by MALDI Comment  Final    Comment: (NOTE) Unable to identify by MALDI. See Organism ID by Sequencing. Performed At: [facility] [address] [place], [place] [id number] Jolene Schimke MD VH:[id number]    SARS Coronavirus 2 by RT PCR (hospital order, performed in [facility] hospital lab) *cepheid single result test* Anterior Nasal Swab     Status: None   Collection Time: 04/23/23  9:30 AM   Specimen: Anterior Nasal Swab  Result Value Ref Range Status   SARS Coronavirus 2 by RT PCR NEGATIVE NEGATIVE Final    Comment: (NOTE) SARS-CoV-2 target nucleic acids are NOT DETECTED.  The SARS-CoV-2 RNA is generally detectable in upper and lower respiratory specimens during the acute phase of infection. The lowest concentration of SARS-CoV-2 viral copies this assay can detect is 250 copies / mL. A negative result does not preclude SARS-CoV-2 infection and should not be used as the sole basis for treatment or other patient management decisions.  A negative result may occur with improper specimen collection / handling, submission of specimen other than nasopharyngeal swab, presence of viral mutation(s) within the areas targeted by this assay, and inadequate number of viral copies (<250 copies / mL). A negative result must be combined with clinical observations, patient history, and epidemiological information.  Fact Sheet for Patients:   RoadLapTop.co.za  Fact Sheet for Healthcare Providers: http://kim-miller.com/  This test is not yet approved or  cleared by the [place] FDA and has been authorized for detection and/or diagnosis of SARS-CoV-2 by FDA under an Emergency Use Authorization (EUA).  This EUA will remain in effect (meaning this test can be used) for the duration of the COVID-19 declaration under Section 564(b)(1) of the Act, 21 U.S.C. section 360bbb-3(b)(1), unless the authorization is terminated or revoked sooner.  Performed at [facility], [address] [place], [place] [address]   Culture, blood (Routine X 2) w Reflex to ID Panel     Status: None   Collection Time: 04/24/23  7:52 PM   Specimen: BLOOD  Result Value  Ref Range Status   Specimen Description BLOOD RIGHT ANTECUBITAL  Final   Special Requests   Final    BOTTLES DRAWN AEROBIC AND ANAEROBIC Blood Culture adequate volume   Culture   Final    NO GROWTH 5 DAYS Performed at [facility], [address], [place], [place] [address]    Report Status 04/29/2023 FINAL  Final  Culture, blood (Routine X 2) w Reflex to ID Panel     Status: None   Collection Time: 04/24/23  7:56 PM   Specimen: BLOOD  Result Value Ref Range Status   Specimen Description BLOOD RIGHT ANTECUBITAL  Final   Special Requests   Final    BOTTLES DRAWN AEROBIC AND ANAEROBIC Blood Culture adequate volume   Culture   Final    NO GROWTH 5 DAYS Performed at [facility], [address], [place], [place] [address]    Report Status 04/29/2023 FINAL  Final  Respiratory (~20 pathogens) panel by PCR     Status: None   Collection Time: 04/25/23  2:01 PM  Specimen: Nasopharyngeal Swab; Respiratory  Result Value Ref Range Status   Adenovirus NOT DETECTED NOT DETECTED Final   Coronavirus 229E NOT DETECTED NOT DETECTED Final    Comment: (NOTE) The Coronavirus on the Respiratory Panel, DOES NOT test for the novel  Coronavirus (2019 nCoV)    Coronavirus HKU1 NOT DETECTED NOT DETECTED Final   Coronavirus NL63 NOT DETECTED NOT DETECTED Final   Coronavirus OC43 NOT DETECTED NOT DETECTED Final   Metapneumovirus NOT DETECTED NOT DETECTED Final   Rhinovirus / Enterovirus NOT DETECTED NOT DETECTED Final   Influenza A NOT DETECTED NOT DETECTED Final   Influenza B NOT DETECTED NOT DETECTED Final   Parainfluenza Virus 1 NOT DETECTED NOT DETECTED Final   Parainfluenza Virus 2 NOT DETECTED NOT DETECTED Final   Parainfluenza Virus 3 NOT DETECTED NOT DETECTED Final   Parainfluenza Virus 4 NOT DETECTED NOT DETECTED Final   Respiratory Syncytial Virus NOT DETECTED NOT DETECTED Final   Bordetella pertussis NOT DETECTED NOT DETECTED Final   Bordetella Parapertussis NOT  DETECTED NOT DETECTED Final   Chlamydophila pneumoniae NOT DETECTED NOT DETECTED Final   Mycoplasma pneumoniae NOT DETECTED NOT DETECTED Final    Comment: Performed at [facility], [address], [place], [place] [address]  Gastrointestinal Panel by PCR , Stool     Status: Abnormal   Collection Time: 04/26/23  6:56 AM   Specimen: Stool  Result Value Ref Range Status   Campylobacter species NOT DETECTED NOT DETECTED Final   Plesimonas shigelloides NOT DETECTED NOT DETECTED Final   Salmonella species NOT DETECTED NOT DETECTED Final   Yersinia enterocolitica NOT DETECTED NOT DETECTED Final   Vibrio species DETECTED (A) NOT DETECTED Final    Comment: RESULT CALLED TO, READ BACK BY AND VERIFIED WITH: JOSEPH HAYES AT 4696 04/29/23.PMF    Vibrio cholerae DETECTED (A) NOT DETECTED Final    Comment: RESULT CALLED TO, READ BACK BY AND VERIFIED WITH: JOSEPH HAYES AT 2952 04/29/23.PMF    Enteroaggregative E coli (EAEC) NOT DETECTED NOT DETECTED Final   Enteropathogenic E coli (EPEC) NOT DETECTED NOT DETECTED Final   Enterotoxigenic E coli (ETEC) NOT DETECTED NOT DETECTED Final   Shiga like toxin producing E coli (STEC) NOT DETECTED NOT DETECTED Final   Shigella/Enteroinvasive E coli (EIEC) NOT DETECTED NOT DETECTED Final   Cryptosporidium NOT DETECTED NOT DETECTED Final   Cyclospora cayetanensis NOT DETECTED NOT DETECTED Final   Entamoeba histolytica NOT DETECTED NOT DETECTED Final   Giardia lamblia NOT DETECTED NOT DETECTED Final   Adenovirus F40/41 NOT DETECTED NOT DETECTED Final   Astrovirus NOT DETECTED NOT DETECTED Final   Norovirus GI/GII NOT DETECTED NOT DETECTED Final   Rotavirus A NOT DETECTED NOT DETECTED Final   Sapovirus (I, II, IV, and V) NOT DETECTED NOT DETECTED Final    Comment: Performed at [facility], [address] [place], [place] [address]  Stool culture     Status: None   Collection Time: 05/01/23  8:51 PM   Specimen: Stool  Result Value Ref Range  Status   Salmonella/Shigella Screen Final report  Final   Campylobacter Culture Final report  Final   E coli, Shiga toxin Assay Negative Negative Final    Comment: (NOTE) Performed At: [facility] [address] [place], [place] [id number] Jolene Schimke MD DG:[id number]   STOOL CULTURE REFLEX - RSASHR     Status: None   Collection Time: 05/01/23  8:51 PM  Result Value Ref Range Status   Stool  Culture result 1 (RSASHR) Comment  Final    Comment: (NOTE) No Salmonella or Shigella recovered. Performed At: [facility] [address] [place], [place] [id number] Jolene Schimke MD WU:[id number]   STOOL CULTURE Reflex - CMPCXR     Status: None   Collection Time: 05/01/23  8:51 PM  Result Value Ref Range Status   Stool Culture result 1 (CMPCXR) Comment  Final    Comment: (NOTE) No Campylobacter species isolated. Performed At: [facility] [address] [place], [place] [id number] Jolene Schimke MD VH:[id number]     Coagulation Studies: No results for input(s): "LABPROT", "INR" in the last 72 hours.   Urinalysis: No results for input(s): "COLORURINE", "LABSPEC", "PHURINE", "GLUCOSEU", "HGBUR", "BILIRUBINUR", "KETONESUR", "PROTEINUR", "UROBILINOGEN", "NITRITE", "LEUKOCYTESUR" in the last 72 hours.  Invalid input(s): "APPERANCEUR"    Imaging: No results found.   Medications:    sodium chloride 5 mL/hr at 05/02/23 0600     apixaban  2.5 mg Oral BID   aspirin EC  81 mg Oral Daily   atorvastatin  80 mg Oral q1800   carvedilol  6.25 mg Oral BID WC   Chlorhexidine Gluconate Cloth  6 each Topical Q0600   epoetin (EPOGEN/PROCRIT) injection  10,000 Units Intravenous Q M,W,F-HD   escitalopram  10 mg Oral Daily   gabapentin  300 mg Oral QHS   insulin aspart  0-5 Units Subcutaneous QHS   insulin aspart  0-6 Units Subcutaneous TID WC   insulin detemir  4 Units Subcutaneous BID   ipratropium-albuterol  3 mL Nebulization TID   irbesartan  150 mg Oral  Daily   midodrine  5 mg Oral Once per day on Mon Wed Fri   multivitamin  1 tablet Oral QHS   pantoprazole  40 mg Oral Daily   sevelamer carbonate  1,600 mg Oral TID WC   traZODone  50 mg Oral Once per day on Mon Wed Fri   sodium chloride, acetaminophen **OR** acetaminophen, guaiFENesin-dextromethorphan, loperamide, ondansetron **OR** ondansetron (ZOFRAN) IV  Assessment/ Plan:  [name] is a 67 y.o.  female with end stage renal disease on hemodialysis, tracheostomy, hypertension, hyperlipidemia, diabetes mellitus type II insulin dependent, depression, and diabetic neuropathy who presents to [facility] for 04/23/2023 for Acute on chronic respiratory failure with hypoxia ([name]) [J96.21] Acute on chronic diastolic (congestive) heart failure ([name]) [I50.33]  [facility] MWF [address] Left AVG 62kg  End Stage Kidney Disease: continue MWF schedule.  Next treatment scheduled for Wednesday.  Hypertension with chronic kidney disease: current regimen of irbesartan, carvedilol and amlodipine. Home regimen of olmesartan instead of irbesartan.  Midodrine available for dialysis.  Diabetes mellitus type II with chronic kidney disease: insulin dependent.  - Glucose elevated, at times - Primary team to manage sliding scale insulin.  Secondary Hyperparathyroidism - Continue sevelamer with meals  Anemia of chronic kidney disease: -Continue EPO 10,000 units with dialysis   LOS: 14   6/4/20243:01 PM

## 2023-05-08 NOTE — Progress Notes (Signed)
PULMONOLOGY         Date: 05/08/2023,   MRN# [id number] Deborah [name] [birth year]     AdmissionWeight:  (Unable to weigh , Pt on stretcher, too short of breath to stand)                 CurrentWeight: 63.4 kg  Referring provider: Dr Sharl Ma   CHIEF COMPLAINT:   Acute on chronic hypoxemic respiratory failure   HISTORY OF PRESENT ILLNESS   This is a pleasant 67 year old female with a history of cardiac dysfunction including diastolic heart failure, chronic atrial fibrillation with atrial flutter, cardiogenic shock, metabolic syndrome with diabetes, dyslipidemia and ESRD with chronic hypertension and is currently receiving dialysis 3 times weekly Monday Wednesday Friday, does have chronic respiratory insufficiency with episodes of hypoxemia tracheostomy status currently requiring 10 L supplemental O2, this particular hospitalization complicated by increased O2 requirement with difficulty weaning.  PCCM consultation for further evaluation management.  05/01/23-  patient in bed on 10L/min trache collar, in no distress this am. CXR with pulm edema and pleural effusion worse on left.    05/07/23- patient for HD today.  She is working with PT/OT.  She seems to have less labored breathing and auscultation is clear today. 05/08/23- patient is stable on trache collar, leukocytosis has resolved. She tells me there is plan for dc home soon.  She appears comfortable despite elevated O2 req.   PAST MEDICAL HISTORY   Past Medical History:  Diagnosis Date   Acute on chronic diastolic CHF (congestive heart failure) ([name]) 01/04/2022   Atrial fibrillation ([name])    Atrial flutter ([name])    Cardiogenic shock ([name]) 08/17/2016   Diabetes mellitus    Diabetes mellitus type 2, controlled ([name]) 03/08/2012   ESRD (end stage renal disease) ([name])    Heart attack ([name])    Hyperlipidemia    Hypertension    MI, old    NSTEMI (non-ST elevated myocardial infarction) ([name])    Renal disorder    dialysls    Respiratory failure ([name]) 08/17/2016   ST elevation myocardial infarction involving left main coronary artery ([name])    STEMI (ST elevation myocardial infarction) ([name]) 08/15/2016   Type 2 diabetes mellitus with complication, with long-term current use of insulin [facility])      SURGICAL HISTORY   Past Surgical History:  Procedure Laterality Date   A/V FISTULAGRAM N/A 10/05/2017   Procedure: A/V Fistulagram;  Surgeon: Renford Dills, MD;  Location: [facility] INVASIVE CV LAB;  Service: Cardiovascular;  Laterality: N/A;   A/V SHUNTOGRAM N/A 10/05/2017   Procedure: A/V SHUNTOGRAM;  Surgeon: Renford Dills, MD;  Location: [facility] INVASIVE CV LAB;  Service: Cardiovascular;  Laterality: N/A;   AV FISTULA PLACEMENT Left 10/02/2016   Procedure: INSERTION OF ARTERIOVENOUS (AV) GORE-TEX GRAFT ARM;  Surgeon: Maeola Harman, MD;  Location: [facility] OR;  Service: Vascular;  Laterality: Left;   CARDIAC CATHETERIZATION N/A 08/15/2016   Procedure: Left Heart Cath and Coronary Angiography;  Surgeon: Alwyn Pea, MD;  Location: [facility] INVASIVE CV LAB;  Service: Cardiovascular;  Laterality: N/A;   CARDIAC CATHETERIZATION N/A 08/15/2016   Procedure: Coronary Stent Intervention;  Surgeon: Alwyn Pea, MD;  Location: [facility] INVASIVE CV LAB;  Service: Cardiovascular;  Laterality: N/A;   CARDIAC CATHETERIZATION N/A 08/18/2016   Procedure: Right Heart Cath;  Surgeon: Dolores Patty, MD;  Location: [facility] INVASIVE CV LAB;  Service: Cardiovascular;  Laterality: N/A;   CARDIAC CATHETERIZATION N/A 08/18/2016   Procedure:  IABP Insertion;  Surgeon: Dolores Patty, MD;  Location: [facility] INVASIVE CV LAB;  Service: Cardiovascular;  Laterality: N/A;   CARDIAC CATHETERIZATION Right 08/23/2016   Procedure: CENTRAL LINE INSERTION RIGHT SUBCLAVIAN;  Surgeon: Kerin Perna, MD;  Location: [facility] OR;  Service: Open Heart Surgery;  Laterality: Right;   ENDOVEIN HARVEST OF GREATER SAPHENOUS VEIN Left 08/23/2016   Procedure: ENDOVEIN  HARVEST OF GREATER SAPHENOUS VEIN;  Surgeon: Kerin Perna, MD;  Location: [facility] OR;  Service: Open Heart Surgery;  Laterality: Left;   INSERTION OF DIALYSIS CATHETER Bilateral 08/31/2016   Procedure: INSERTION OF DIALYSIS CATHETER LEFT INTERNAL JUGULAR VEIN & INSERTION OF TRIPLE LUMEN RIGHT INTERNAL JUGULAR VEIN;  Surgeon: Chuck Hint, MD;  Location: [facility] OR;  Service: Vascular;  Laterality: Bilateral;   INTRAOPERATIVE TRANSESOPHAGEAL ECHOCARDIOGRAM N/A 08/23/2016   Procedure: INTRAOPERATIVE TRANSESOPHAGEAL ECHOCARDIOGRAM;  Surgeon: Kerin Perna, MD;  Location: [facility] OR;  Service: Open Heart Surgery;  Laterality: N/A;   IR GENERIC HISTORICAL  11/13/2016   IR US GUIDE VASC ACCESS LEFT 11/13/2016 Gilmer Mor, DO [facility]   IR GENERIC HISTORICAL  11/13/2016   IR FLUORO GUIDE CV LINE LEFT 11/13/2016 Gilmer Mor, DO [facility]   IR GENERIC HISTORICAL  11/13/2016   IR GASTROSTOMY TUBE MOD SED 11/13/2016 Gilmer Mor, DO [facility]   MITRAL VALVE REPAIR N/A 08/23/2016   Procedure: MITRAL VALVE REPAIR (MVR) USING EDWARDS MAGNA EASE BIOPROSTHESIS MITRAL  VALVE;  Surgeon: Kerin Perna, MD;  Location: [facility] OR;  Service: Open Heart Surgery;  Laterality: N/A;   tracheostomy reversal     TRACHEOSTOMY TUBE PLACEMENT N/A 11/09/2016   Procedure: TRACHEOSTOMY;  Surgeon: Suzanna Obey, MD;  Location: [facility] OR;  Service: ENT;  Laterality: N/A;   VAGINAL DELIVERY     x 6     FAMILY HISTORY   Family History  Problem Relation Age of Onset   Hypertension Mother    Diabetes Mother    Breast cancer Sister    Hypertension Father      SOCIAL HISTORY   Social History   Tobacco Use   Smoking status: Never   Smokeless tobacco: Never  Substance Use Topics   Alcohol use: No   Drug use: No     MEDICATIONS    Home Medication:    Current Medication:  Current Facility-Administered Medications:    0.9 %  sodium chloride infusion, , Intravenous, PRN, [name], MD, Last Rate: 5  mL/hr at 05/02/23 0600, Infusion Verify at 05/02/23 0600   acetaminophen (TYLENOL) tablet 650 mg, 650 mg, Oral, Q6H PRN, 650 mg at 05/03/23 2156 **OR** acetaminophen (TYLENOL) suppository 650 mg, 650 mg, Rectal, Q6H PRN, Sharl Ma, Sarina Ill, MD   apixaban (ELIQUIS) tablet 2.5 mg, 2.5 mg, Oral, BID, Sharl Ma, Gagan S, MD, 2.5 mg at 05/07/23 2206   aspirin EC tablet 81 mg, 81 mg, Oral, Daily, Sharl Ma, Gagan S, MD, 81 mg at 05/07/23 0956   atorvastatin (LIPITOR) tablet 80 mg, 80 mg, Oral, q1800, [name], MD, 80 mg at 05/07/23 2212   carvedilol (COREG) tablet 6.25 mg, 6.25 mg, Oral, BID WC, Enedina Finner, MD, 6.25 mg at 05/07/23 2213   Chlorhexidine Gluconate Cloth 2 % PADS 6 each, 6 each, Topical, Q0600, [name], MD, 6 each at 05/08/23 [phone]   epoetin alfa (EPOGEN) injection 10,000 Units, 10,000 Units, Intravenous, Q M,W,F-HD, [name], MD, 10,000 Units at 05/04/23 1714   escitalopram (LEXAPRO) tablet 10 mg, 10 mg, Oral,  Daily, [name], MD, 10 mg at 05/07/23 1240   gabapentin (NEURONTIN) capsule 300 mg, 300 mg, Oral, QHS, Sharl Ma, Gagan S, MD, 300 mg at 05/07/23 2205   guaiFENesin-dextromethorphan (ROBITUSSIN DM) 100-10 MG/5ML syrup 15 mL, 15 mL, Oral, Q4H PRN, Sharl Ma, Gagan S, MD, 15 mL at 05/01/23 1445   insulin aspart (novoLOG) injection 0-5 Units, 0-5 Units, Subcutaneous, QHS, [name], MD, 2 Units at 05/05/23 2116   insulin aspart (novoLOG) injection 0-6 Units, 0-6 Units, Subcutaneous, TID WC, [name], MD, 1 Units at 05/07/23 1241   insulin detemir (LEVEMIR) injection 4 Units, 4 Units, Subcutaneous, BID, Sharl Ma, Sarina Ill, MD, 4 Units at 05/07/23 2204   ipratropium-albuterol (DUONEB) 0.5-2.5 (3) MG/3ML nebulizer solution 3 mL, 3 mL, Nebulization, TID, Sharl Ma, Sarina Ill, MD, 3 mL at 05/08/23 0717   irbesartan (AVAPRO) tablet 150 mg, 150 mg, Oral, Daily, Sharl Ma, Gagan S, MD, 150 mg at 05/06/23 0815   loperamide (IMODIUM) capsule 2 mg, 2 mg, Oral, PRN, [name], MD, 2 mg at 05/01/23 2121    midodrine (PROAMATINE) tablet 5 mg, 5 mg, Oral, Once per day on Mon Wed Fri, Lama, Gagan S, MD, 5 mg at 05/07/23 1610   multivitamin (RENA-VIT) tablet 1 tablet, 1 tablet, Oral, QHS, Sharl Ma, Gagan S, MD, 1 tablet at 05/07/23 2206   ondansetron (ZOFRAN) tablet 4 mg, 4 mg, Oral, Q6H PRN, 4 mg at 04/23/23 2346 **OR** ondansetron (ZOFRAN) injection 4 mg, 4 mg, Intravenous, Q6H PRN, Sharl Ma, Sarina Ill, MD, 4 mg at 04/27/23 0043   pantoprazole (PROTONIX) EC tablet 40 mg, 40 mg, Oral, Daily, Sharl Ma, Gagan S, MD, 40 mg at 05/07/23 0956   sevelamer carbonate (RENVELA) tablet 1,600 mg, 1,600 mg, Oral, TID WC, Sharl Ma, Gagan S, MD, 1,600 mg at 05/07/23 1241   traZODone (DESYREL) tablet 50 mg, 50 mg, Oral, Once per day on Mon Wed Fri, Lama, Gagan S, MD, 50 mg at 05/07/23 2205    ALLERGIES   Patient has no known allergies.     REVIEW OF SYSTEMS    Review of Systems:  Gen:  Denies  fever, sweats, chills weigh loss  HEENT: Denies blurred vision, double vision, ear pain, eye pain, hearing loss, nose bleeds, sore throat Cardiac:  No dizziness, chest pain or heaviness, chest tightness,edema Resp:   reports dyspnea chronically  Gi: Denies swallowing difficulty, stomach pain, nausea or vomiting, diarrhea, constipation, bowel incontinence Gu:  Denies bladder incontinence, burning urine Ext:   Denies Joint pain, stiffness or swelling Skin: Denies  skin rash, easy bruising or bleeding or hives Endoc:  Denies polyuria, polydipsia , polyphagia or weight change Psych:   Denies depression, insomnia or hallucinations   Other:  All other systems negative   VS: BP 122/62 (BP Location: Right Arm)   Pulse 64   Temp 98 F (36.7 C) (Oral)   Resp 18   Ht 5\' 3"  (1.6 m)   Wt 63.4 kg   SpO2 99%   BMI 24.76 kg/m      PHYSICAL EXAM    GENERAL:NAD, no fevers, chills, no weakness no fatigue HEAD: Normocephalic, atraumatic.  EYES: Pupils equal, round, reactive to light. Extraocular muscles intact. No scleral icterus.   MOUTH: Moist mucosal membrane. Dentition intact. No abscess noted.  EAR, NOSE, THROAT: Clear without exudates. No external lesions.  NECK: Supple. No thyromegaly. No nodules. No JVD.  PULMONARY: decreased breath sounds with mild rhonchi worse at bases bilaterally.  CARDIOVASCULAR: S1 and S2. Regular rate and  rhythm. No murmurs, rubs, or gallops. No edema. Pedal pulses 2+ bilaterally.  GASTROINTESTINAL: Soft, nontender, nondistended. No masses. Positive bowel sounds. No hepatosplenomegaly.  MUSCULOSKELETAL: No swelling, clubbing, or edema. Range of motion full in all extremities.  NEUROLOGIC: Cranial nerves II through XII are intact. No gross focal neurological deficits. Sensation intact. Reflexes intact.  SKIN: No ulceration, lesions, rashes, or cyanosis. Skin warm and dry. Turgor intact.  PSYCHIATRIC: Mood, affect within normal limits. The patient is awake, alert and oriented x 3. Insight, judgment intact.       IMAGING   Narrative & Impression  CLINICAL DATA:  Atelectasis.   EXAM: PORTABLE CHEST 1 VIEW   COMPARISON:  Radiograph and CT 04/23/2023   FINDINGS: Tracheostomy tube tip at the thoracic inlet. Median sternotomy and prosthetic cardiac valve. Stable cardiomegaly. Improvement in right pleural effusion. Similar left pleural effusion and basilar opacity. Pulmonary edema is unchanged. No pneumothorax.   IMPRESSION: 1. Improvement in right pleural effusion. 2. Unchanged left pleural effusion and basilar opacity. 3. Unchanged pulmonary edema.       -pulm edema and bilateral effusions worse on left - CXR 04/29/23 - independent review     ASSESSMENT/PLAN     Acute on chronic hypoxemic respiratory failure Status post chest x-ray-interval changes with improved right lower lobe however left hemithorax with interstitial edema and atelectasis. -CRP is within reference range suggestive of absence of inflammatory changes in low likelihood of infectious or inflammatory  pneumonia, there is a very mild leukocytosis 11.3 which may be due to underlying GI cholera.    Bibasilar atelectasis -Due to compressive effects with surrounding pleural effusion as well as chronic deconditioning and interstitial edema with development of atelectatic segments.  Patient should start chronic physical therapy and Occupational Therapy with pulmonary rehab on outpatient basis, in the interim recruitment maneuvers with MetaNeb device utilizing albuterol as well as utilization of chest physiotherapy with incentive spirometry each hour.  continue inpatient physical and occupational therapy Patient is also on DuoNeb nebulizer therapy which should also help treat atelectasis.   Interstitial edema -Nephrology is on case patient is currently being dialyzed -Complex fluid management due to ESRD Strict I&O fluid restricted diet    Thank you for allowing me to participate in the care of this patient.   Patient/Family are satisfied with care plan and all questions have been answered.    Provider disclosure: Patient with at least one acute or chronic illness or injury that poses a threat to life or bodily function and is being managed actively during this encounter.  All of the below services have been performed independently by signing provider:  review of prior documentation from internal and or external health records.  Review of previous and current lab results.  Interview and comprehensive assessment during patient visit today. Review of current and previous chest radiographs/CT scans. Discussion of management and test interpretation with health care team and patient/family.   This document was prepared using Dragon voice recognition software and may include unintentional dictation errors.     Vida Rigger, M.D.  Division of Pulmonary & Critical Care Medicine

## 2023-05-08 NOTE — Progress Notes (Signed)
[facility]  - [place] [facility]   PATIENT NAME: Deborah [name]    MR#:  [id number]  DATE OF BIRTH:  04-24-1956  SUBJECTIVE:  patient sitting up in the bed . Overall stable Patient remains stable on current oxygen. Able to ambulate to the bathroom by herself.  VITALS:  Blood pressure (!) 130/53, pulse 66, temperature 98.5 F (36.9 C), temperature source Oral, resp. rate 18, height 5\' 3"  (1.6 m), weight 63.4 kg, SpO2 100 %.  PHYSICAL EXAMINATION:   GENERAL:  67 y.o.-year-old patient with no acute distress.  LUNGS: Normal breath sounds bilaterally, no wheezing. Trach Collar+ CARDIOVASCULAR: S1, S2 normal. No murmur   ABDOMEN: Soft, nontender, nondistended. Bowel sounds present.  EXTREMITIES: No  edema b/l.   HD access+ NEUROLOGIC: nonfocal  patient is alert and awake  LABORATORY PANEL:  CBC Recent Labs  Lab 05/04/23 1626  WBC 9.1  HGB 7.8*  HCT 24.4*  PLT 316     Chemistries  Recent Labs  Lab 05/04/23 1626  NA 134*  K 4.6  CL 95*  CO2 27  GLUCOSE 237*  BUN 42*  CREATININE 8.69*  CALCIUM 8.7*    Assessment and Plan  Acute on chronic respiratory failure with hypoxia -Continue to require 10 L/min oxygen; usually less than 6 L/min at home -Chronic Tracheostomy status -CT chest showed vascular congestion/volume overload with acute on chronic diastolic heart failure -Nephrology consulted for dialysis -Swallow evaluation obtained, recommend regular consistency diet with thin liquids, aspiration precautions -Pulmonology consulted; started on inpatient physical and occupational therapy, and recommend outpatient PT/OT with pulmonary rehab.  Started on chest PT and incentive spirometry for bibasilar atelectasis.  -Chest x-ray obtained today shows unchanged left pleural effusion and basilar opacity, unchanged pulmonary edema, improvement right pleural effusion -Lasix 80 mg IV daily ordered per pulmonology --d/w Dr Rubye Beach down oxygen to keep  sats >89-90% -- discussed with respiratory therapist. Patient has asymptomatic and her sats intermittently drop in the 80s without any distress. Comes up in few seconds to minute.  --cont metaneb --6/1 d/w RT--per RT "The air entrainment device she is connected to in the room requires 10 liters of oxygen to produce 35% FIO2 of oxygen. If someone were on 10 lpm of oxygen nasal canula that would be 60% FIO2. There are many different kinds of entrainment devices. --currently pt is on 3-4 liter/min of her home equivalent oxygen. (Dr Karna Christmas aware and agrees) -6/2--pt remains stable -- Physical therapy evaluation noted. Home health PT will be arranged. --6/3--awaiting TOC to get portable tank issue resolved --6/4--TOC has been working to get oxygen tank thru different vendor.  Community-acquired pneumonia -Sputum culture also grew Corynebacterium species -ID was consult--pt was treated for  pseudomonas resp infection with cefepime for 10 days-total.    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 Rivka Safer -She recommends to obtain stool culture and treat with azithromycin 1 g p.o. x 1.  --improved    ESRD -Patient on hemodialysis   Mitral valve replacement -2D echo showed normal functioning valve, EF 55 to 60%   Paroxysmal atrial fibrillation -Continue Eliquis, Coreg   Diabetes mellitus type 2 with ESRD -Continue Levemir, sliding scale insulin with NovoLog -CBG well-controlled   Hypertension --Blood pressure is stable -- amlodipine, Coreg,irbesartan --takes midodrine on HD days  HHPT at discharge  Family communication :none today Consults : ID, pulmonary, nephrology CODE STATUS: full DVT Prophylaxis : eliquis  Level of care: Progressive Status is: Inpatient Remains inpatient appropriate because: pt has been ready for discharge. Awaiting TOC to get the portable tank arranged  TOTAL TIME  TAKING CARE OF THIS PATIENT: 35 minutes.  >50% time spent on counselling and coordination of care  Note: This dictation was prepared with Dragon dictation along with smaller phrase technology. Any transcriptional errors that result from this process are unintentional.  Enedina Finner M.D    [facility]   CC: Primary care physician; Si Gaul, MD

## 2023-05-08 NOTE — TOC Progression Note (Signed)
Transition of Care [facility]) - Progression Note    Patient Details  Name: Deborah [name] MRN: [id number] Date of Birth: Mar 18, 1956  Transition of [facility]) CM/SW Contact  Truddie Hidden, RN Phone Number: 05/08/2023, 4:18 PM  Clinical Narrative:    Sherron Monday with Shelly from [facility] Patient is not active with them. They are unable to provide service to this patient.  Per Burnett Harry they service pediatic and adult ventilated patients.    Expected Discharge Plan: Home w Home Health Services Barriers to Discharge: Continued Medical Work up  Expected Discharge Plan and Services     Post Acute Care Choice: Resumption of Svcs/PTA Provider Living arrangements for the past 2 months: Apartment                                       Social Determinants of Health (SDOH) Interventions SDOH Screenings   Food Insecurity: No Food Insecurity (04/24/2023)  Housing: Patient Declined (04/24/2023)  Transportation Needs: No Transportation Needs (04/24/2023)  Utilities: Not At Risk (04/24/2023)  Tobacco Use: Low Risk  (04/24/2023)    Readmission Risk Interventions    04/27/2023    4:18 PM 11/04/2020    9:20 AM  Readmission Risk Prevention Plan  Transportation Screening Complete Complete  PCP or Specialist Appt within 3-5 Days Complete   HRI or Home Care Consult Complete   Social Work Consult for Recovery Care Planning/Counseling Complete   Palliative Care Screening Not Applicable   Medication Review Oceanographer) Complete   PCP or Specialist appointment within 3-5 days of discharge  Complete  SW Recovery Care/Counseling Consult  Complete  Palliative Care Screening  Not Applicable  Skilled Nursing Facility  Not Applicable

## 2023-05-08 NOTE — Discharge Planning (Signed)
Making correction, clinic cannot take patient unless she is able to tolerate 5L or less of O2.

## 2023-05-09 DIAGNOSIS — J189 Pneumonia, unspecified organism: Secondary | ICD-10-CM | POA: Diagnosis not present

## 2023-05-09 DIAGNOSIS — I5033 Acute on chronic diastolic (congestive) heart failure: Secondary | ICD-10-CM | POA: Diagnosis not present

## 2023-05-09 DIAGNOSIS — A09 Infectious gastroenteritis and colitis, unspecified: Secondary | ICD-10-CM | POA: Diagnosis not present

## 2023-05-09 DIAGNOSIS — J9621 Acute and chronic respiratory failure with hypoxia: Secondary | ICD-10-CM | POA: Diagnosis not present

## 2023-05-09 LAB — GLUCOSE, CAPILLARY
Glucose-Capillary: 148 mg/dL — ABNORMAL HIGH (ref 70–99)
Glucose-Capillary: 138 mg/dL — ABNORMAL HIGH (ref 70–99)

## 2023-05-09 LAB — RENAL FUNCTION PANEL
Albumin: 3 g/dL — ABNORMAL LOW (ref 3.5–5.0)
Anion gap: 10 (ref 5–15)
BUN: 44 mg/dL — ABNORMAL HIGH (ref 8–23)
CO2: 28 mmol/L (ref 22–32)
Calcium: 8.9 mg/dL (ref 8.9–10.3)
Chloride: 97 mmol/L — ABNORMAL LOW (ref 98–111)
Creatinine, Ser: 8.59 mg/dL — ABNORMAL HIGH (ref 0.44–1.00)
GFR, Estimated: 5 mL/min — ABNORMAL LOW (ref >60)
Glucose, Bld: 157 mg/dL — ABNORMAL HIGH (ref 70–99)
Phosphorus: 5.6 mg/dL — ABNORMAL HIGH (ref 2.5–4.6)
Potassium: 4 mmol/L (ref 3.5–5.1)
Sodium: 135 mmol/L (ref 135–145)

## 2023-05-09 LAB — CBC
HCT: 25.7 % — ABNORMAL LOW (ref 36.0–46.0)
Hemoglobin: 8.3 g/dL — ABNORMAL LOW (ref 12.0–15.0)
MCH: 30.2 pg (ref 26.0–34.0)
MCHC: 32.3 g/dL (ref 30.0–36.0)
MCV: 93.5 fL (ref 80.0–100.0)
Platelets: 338 10*3/uL (ref 150–400)
RBC: 2.75 MIL/uL — ABNORMAL LOW (ref 3.87–5.11)
RDW: 18.2 % — ABNORMAL HIGH (ref 11.5–15.5)
WBC: 7.1 10*3/uL (ref 4.0–10.5)
nRBC: 0 % (ref 0.0–0.2)

## 2023-05-09 MED ORDER — CARVEDILOL 6.25 MG PO TABS: 6.2500 mg | ORAL_TABLET | Freq: Two times a day (BID) | ORAL | 0 refills | Status: AC

## 2023-05-09 MED ORDER — EPOETIN ALFA 10000 UNIT/ML IJ SOLN
INTRAMUSCULAR | Status: AC
  Filled 2023-05-09: qty 1

## 2023-05-09 MED ORDER — PANTOPRAZOLE SODIUM 40 MG PO TBEC: 40.0000 mg | DELAYED_RELEASE_TABLET | Freq: Every day | ORAL | 0 refills | Status: AC

## 2023-05-09 NOTE — TOC Progression Note (Addendum)
Transition of Care [facility]) - Progression Note    Patient Details  Name: Deborah [name] MRN: [id number] Date of Birth: [birth year]  Transition of Care [facility]) CM/SW Contact  Margarito Liner, LCSW Phone Number: 05/09/2023, 10:44 AM  Clinical Narrative: Called son to provide update. [facility] is reviewing referral.    2:13 pm: [employer] has sent driver to deliver tank and spoken to son. Will need sats note and DME order when she returns from HD. Sent secure chat to RN and MD to notify. Updated [facility].  Expected Discharge Plan: Home w Home Health Services Barriers to Discharge: Continued Medical Work up  Expected Discharge Plan and Services     Post Acute Care Choice: Resumption of Svcs/PTA Provider Living arrangements for the past 2 months: Apartment                                       Social Determinants of Health (SDOH) Interventions SDOH Screenings   Food Insecurity: No Food Insecurity (04/24/2023)  Housing: Patient Declined (04/24/2023)  Transportation Needs: No Transportation Needs (04/24/2023)  Utilities: Not At Risk (04/24/2023)  Tobacco Use: Low Risk  (04/24/2023)    Readmission Risk Interventions    04/27/2023    4:18 PM 11/04/2020    9:20 AM  Readmission Risk Prevention Plan  Transportation Screening Complete Complete  PCP or Specialist Appt within 3-5 Days Complete   HRI or Home Care Consult Complete   Social Work Consult for Recovery Care Planning/Counseling Complete   Palliative Care Screening Not Applicable   Medication Review Oceanographer) Complete   PCP or Specialist appointment within 3-5 days of discharge  Complete  SW Recovery Care/Counseling Consult  Complete  Palliative Care Screening  Not Applicable  Skilled Nursing Facility  Not Applicable

## 2023-05-09 NOTE — Assessment & Plan Note (Signed)
EF 55%.  Dialysis to manage fluid.  On low-dose Coreg.

## 2023-05-09 NOTE — Progress Notes (Signed)
  Received patient in bed to unit.   Informed consent signed and in chart.    TX duration:3.15hrs     Transported back to floor  Hand-off given to patient's nurse.    Access used: LAVG   Access issues: none   Total UF removed: 1.5L Medication(s) given: 10,000u epo Post HD VS: 151/69 Post HD weight 63.7kg     Lynann Beaver  [facility]

## 2023-05-09 NOTE — Progress Notes (Signed)
[facility]  ROUNDING NOTE   Subjective:   [name] returns to [facility] on 04/23/2023 for Acute on chronic respiratory failure with hypoxia ([name]) [J96.21] Acute on chronic diastolic (congestive) heart failure ([name]) [I50.33]  Patient seen and evaluated during dialysis   HEMODIALYSIS FLOWSHEET:  Blood Flow Rate (mL/min): 400 mL/min Arterial Pressure (mmHg): -130 mmHg Venous Pressure (mmHg): 190 mmHg TMP (mmHg): 12 mmHg Ultrafiltration Rate (mL/min): 707 mL/min Dialysate Flow Rate (mL/min): 300 ml/min Dialysis Fluid Bolus: Normal Saline  Tolerating treatment well  No complaints to offer  Objective:  Vital signs in last 24 hours:  Temp:  [98.1 F (36.7 C)-99.2 F (37.3 C)] 98.1 F (36.7 C) (06/05 0933) Pulse Rate:  [66-73] 73 (06/05 1230) Resp:  [12-18] 16 (06/05 1230) BP: (129-162)/(50-80) 162/67 (06/05 1230) SpO2:  [90 %-100 %] 94 % (06/05 1230) FiO2 (%):  [30 %-35 %] 30 % (06/05 0844) Weight:  [65.1 kg] 65.1 kg (06/05 0939)  Weight change:  Filed Weights   05/07/23 1449 05/07/23 1850 05/09/23 0939  Weight: 65.1 kg 63.4 kg 65.1 kg    Intake/Output: No intake/output data recorded.   Intake/Output this shift:  No intake/output data recorded.  Physical Exam: General: NAD  Head: Normocephalic, atraumatic. Moist oral mucosal membranes  Eyes: Anicteric  Neck: +tracheostomy  Lungs:  Diminished in bases  bilaterally  Heart: Regular rate and rhythm  Abdomen:  Soft, nontender  Extremities: No peripheral edema.  Neurologic: Nonfocal, moving all four extremities  Skin: No lesions  Access: Left AVG    Basic Metabolic Panel: Recent Labs  Lab 05/04/23 1626 05/09/23 0736  NA 134* 135  K 4.6 4.0  CL 95* 97*  CO2 27 28  GLUCOSE 237* 157*  BUN 42* 44*  CREATININE 8.69* 8.59*  CALCIUM 8.7* 8.9  PHOS 5.2* 5.6*     Liver Function Tests: Recent Labs  Lab 05/04/23 1626 05/09/23 0736  ALBUMIN 3.0* 3.0*    No results for input(s): "LIPASE",  "AMYLASE" in the last 168 hours. No results for input(s): "AMMONIA" in the last 168 hours.  CBC: Recent Labs  Lab 05/04/23 1626 05/09/23 0736  WBC 9.1 7.1  HGB 7.8* 8.3*  HCT 24.4* 25.7*  MCV 95.3 93.5  PLT 316 338     Cardiac Enzymes: No results for input(s): "CKTOTAL", "CKMB", "CKMBINDEX", "TROPONINI" in the last 168 hours.  BNP: Invalid input(s): "POCBNP"  CBG: Recent Labs  Lab 05/08/23 0742 05/08/23 1316 05/08/23 1738 05/08/23 2137 05/09/23 0835  GLUCAP 135* 235* 172* 133* 148*     Microbiology: Results for orders placed or performed during the hospital encounter of 04/23/23  Organism ID, Bacteria     Status: None (Preliminary result)   Collection Time: 04/18/23  8:20 PM  Result Value Ref Range Status   Organism ID, Bacteria REFERT  Final    Comment: (NOTE) Please refer to the following specimen for additional lab results. Please refer to spec # [phone] for test result. Camelia Moraru notified 05-02-2023 at 13:00 Performed At: [facility] [address] [place], [place] [id number] Jolene Schimke MD VH:[id number]    Source of Sample PENDING  Incomplete  Bacterial organism reflex     Status: None   Collection Time: 04/18/23  8:20 PM  Result Value Ref Range Status   Bacterial result 1 Comment  Final    Comment: (NOTE) Specimen has been received and testing has been initiated. Performed At: [facility] [address] [place], [place] [id number] Jolene Schimke MD  ZO:[id number]   Organism ID by MALDI     Status: None (Preliminary result)   Collection Time: 04/18/23  8:20 PM  Result Value Ref Range Status   Organism ID by MALDI Comment  Final    Comment: (NOTE) Specimen has been received. Testing has been initiated. Performed At: [facility] [address] [place], [place] [id number] Jolene Schimke MD NF:[id number]    Source (MTB RIF) PENDING  Incomplete  Aerobic ID by MALDI     Status: None   Collection Time:  04/18/23  8:20 PM  Result Value Ref Range Status   Aerobic ID by MALDI Comment  Final    Comment: (NOTE) Unable to identify by MALDI. See Organism ID by Sequencing. Performed At: [facility] [address] [place], [place] [id number] Jolene Schimke MD UX:[id number]   SARS Coronavirus 2 by RT PCR (hospital order, performed in [facility] hospital lab) *cepheid single result test* Anterior Nasal Swab     Status: None   Collection Time: 04/23/23  9:30 AM   Specimen: Anterior Nasal Swab  Result Value Ref Range Status   SARS Coronavirus 2 by RT PCR NEGATIVE NEGATIVE Final    Comment: (NOTE) SARS-CoV-2 target nucleic acids are NOT DETECTED.  The SARS-CoV-2 RNA is generally detectable in upper and lower respiratory specimens during the acute phase of infection. The lowest concentration of SARS-CoV-2 viral copies this assay can detect is 250 copies / mL. A negative result does not preclude SARS-CoV-2 infection and should not be used as the sole basis for treatment or other patient management decisions.  A negative result may occur with improper specimen collection / handling, submission of specimen other than nasopharyngeal swab, presence of viral mutation(s) within the areas targeted by this assay, and inadequate number of viral copies (<250 copies / mL). A negative result must be combined with clinical observations, patient history, and epidemiological information.  Fact Sheet for Patients:   RoadLapTop.co.za  Fact Sheet for Healthcare Providers: http://kim-miller.com/  This test is not yet approved or  cleared by the [place] FDA and has been authorized for detection and/or diagnosis of SARS-CoV-2 by FDA under an Emergency Use Authorization (EUA).  This EUA will remain in effect (meaning this test can be used) for the duration of the COVID-19 declaration under Section 564(b)(1) of the Act, 21 U.S.C. section 360bbb-3(b)(1),  unless the authorization is terminated or revoked sooner.  Performed at [facility], [address] [place], [place] [address]   Culture, blood (Routine X 2) w Reflex to ID Panel     Status: None   Collection Time: 04/24/23  7:52 PM   Specimen: BLOOD  Result Value Ref Range Status   Specimen Description BLOOD RIGHT ANTECUBITAL  Final   Special Requests   Final    BOTTLES DRAWN AEROBIC AND ANAEROBIC Blood Culture adequate volume   Culture   Final    NO GROWTH 5 DAYS Performed at [facility], [address] [place], [place] [address]    Report Status 04/29/2023 FINAL  Final  Culture, blood (Routine X 2) w Reflex to ID Panel     Status: None   Collection Time: 04/24/23  7:56 PM   Specimen: BLOOD  Result Value Ref Range Status   Specimen Description BLOOD RIGHT ANTECUBITAL  Final   Special Requests   Final    BOTTLES DRAWN AEROBIC AND ANAEROBIC Blood Culture adequate volume   Culture   Final    NO GROWTH 5 DAYS Performed  at [facility], [address] [place] [address] [place], [place] [address]    Report Status 04/29/2023 FINAL  Final  Respiratory (~20 pathogens) panel by PCR     Status: None   Collection Time: 04/25/23  2:01 PM   Specimen: Nasopharyngeal Swab; Respiratory  Result Value Ref Range Status   Adenovirus NOT DETECTED NOT DETECTED Final   Coronavirus 229E NOT DETECTED NOT DETECTED Final    Comment: (NOTE) The Coronavirus on the Respiratory Panel, DOES NOT test for the novel  Coronavirus (2019 nCoV)    Coronavirus HKU1 NOT DETECTED NOT DETECTED Final   Coronavirus NL63 NOT DETECTED NOT DETECTED Final   Coronavirus OC43 NOT DETECTED NOT DETECTED Final   Metapneumovirus NOT DETECTED NOT DETECTED Final   Rhinovirus / Enterovirus NOT DETECTED NOT DETECTED Final   Influenza A NOT DETECTED NOT DETECTED Final   Influenza B NOT DETECTED NOT DETECTED Final   Parainfluenza Virus 1 NOT DETECTED NOT DETECTED Final   Parainfluenza Virus 2 NOT DETECTED NOT  DETECTED Final   Parainfluenza Virus 3 NOT DETECTED NOT DETECTED Final   Parainfluenza Virus 4 NOT DETECTED NOT DETECTED Final   Respiratory Syncytial Virus NOT DETECTED NOT DETECTED Final   Bordetella pertussis NOT DETECTED NOT DETECTED Final   Bordetella Parapertussis NOT DETECTED NOT DETECTED Final   Chlamydophila pneumoniae NOT DETECTED NOT DETECTED Final   Mycoplasma pneumoniae NOT DETECTED NOT DETECTED Final    Comment: Performed at [facility], [address], [place], [place] [address]  Gastrointestinal Panel by PCR , Stool     Status: Abnormal   Collection Time: 04/26/23  6:56 AM   Specimen: Stool  Result Value Ref Range Status   Campylobacter species NOT DETECTED NOT DETECTED Final   Plesimonas shigelloides NOT DETECTED NOT DETECTED Final   Salmonella species NOT DETECTED NOT DETECTED Final   Yersinia enterocolitica NOT DETECTED NOT DETECTED Final   Vibrio species DETECTED (A) NOT DETECTED Final    Comment: RESULT CALLED TO, READ BACK BY AND VERIFIED WITH: JOSEPH HAYES AT 0981 04/29/23.PMF    Vibrio cholerae DETECTED (A) NOT DETECTED Final    Comment: RESULT CALLED TO, READ BACK BY AND VERIFIED WITH: JOSEPH HAYES AT 1914 04/29/23.PMF    Enteroaggregative E coli (EAEC) NOT DETECTED NOT DETECTED Final   Enteropathogenic E coli (EPEC) NOT DETECTED NOT DETECTED Final   Enterotoxigenic E coli (ETEC) NOT DETECTED NOT DETECTED Final   Shiga like toxin producing E coli (STEC) NOT DETECTED NOT DETECTED Final   Shigella/Enteroinvasive E coli (EIEC) NOT DETECTED NOT DETECTED Final   Cryptosporidium NOT DETECTED NOT DETECTED Final   Cyclospora cayetanensis NOT DETECTED NOT DETECTED Final   Entamoeba histolytica NOT DETECTED NOT DETECTED Final   Giardia lamblia NOT DETECTED NOT DETECTED Final   Adenovirus F40/41 NOT DETECTED NOT DETECTED Final   Astrovirus NOT DETECTED NOT DETECTED Final   Norovirus GI/GII NOT DETECTED NOT DETECTED Final   Rotavirus A NOT DETECTED NOT DETECTED  Final   Sapovirus (I, II, IV, and V) NOT DETECTED NOT DETECTED Final    Comment: Performed at [facility] [address] [place], [place] [address]  Stool culture     Status: None   Collection Time: 05/01/23  8:51 PM   Specimen: Stool  Result Value Ref Range Status   Salmonella/Shigella Screen Final report  Final   Campylobacter Culture Final report  Final   E coli, Shiga toxin Assay Negative Negative Final    Comment: (NOTE) Performed At:  [facility] [address] [place], [place] [id number] Jolene Schimke MD OZ:[id number]   STOOL CULTURE REFLEX - RSASHR     Status: None   Collection Time: 05/01/23  8:51 PM  Result Value Ref Range Status   Stool Culture result 1 (RSASHR) Comment  Final    Comment: (NOTE) No Salmonella or Shigella recovered. Performed At: [facility] [address] [place], [place] [id number] Jolene Schimke MD KG:[id number]   STOOL CULTURE Reflex - CMPCXR     Status: None   Collection Time: 05/01/23  8:51 PM  Result Value Ref Range Status   Stool Culture result 1 (CMPCXR) Comment  Final    Comment: (NOTE) No Campylobacter species isolated. Performed At: [facility] [address] [place], [place] [id number] Jolene Schimke MD VZ:[id number]     Coagulation Studies: No results for input(s): "LABPROT", "INR" in the last 72 hours.   Urinalysis: No results for input(s): "COLORURINE", "LABSPEC", "PHURINE", "GLUCOSEU", "HGBUR", "BILIRUBINUR", "KETONESUR", "PROTEINUR", "UROBILINOGEN", "NITRITE", "LEUKOCYTESUR" in the last 72 hours.  Invalid input(s): "APPERANCEUR"    Imaging: No results found.   Medications:    sodium chloride 5 mL/hr at 05/02/23 0600     apixaban  2.5 mg Oral BID   aspirin EC  81 mg Oral Daily   atorvastatin  80 mg Oral q1800   carvedilol  6.25 mg Oral BID WC   Chlorhexidine Gluconate Cloth  6 each Topical Q0600   epoetin (EPOGEN/PROCRIT) injection  10,000 Units Intravenous Q M,W,F-HD    escitalopram  10 mg Oral Daily   gabapentin  300 mg Oral QHS   insulin aspart  0-5 Units Subcutaneous QHS   insulin aspart  0-6 Units Subcutaneous TID WC   insulin detemir  4 Units Subcutaneous BID   ipratropium-albuterol  3 mL Nebulization TID   irbesartan  150 mg Oral Daily   midodrine  5 mg Oral Once per day on Mon Wed Fri   multivitamin  1 tablet Oral QHS   pantoprazole  40 mg Oral Daily   sevelamer carbonate  1,600 mg Oral TID WC   traZODone  50 mg Oral Once per day on Mon Wed Fri   sodium chloride, acetaminophen **OR** acetaminophen, guaiFENesin-dextromethorphan, loperamide, ondansetron **OR** ondansetron (ZOFRAN) IV  Assessment/ Plan:  [name] is a 67 y.o.  female with end stage renal disease on hemodialysis, tracheostomy, hypertension, hyperlipidemia, diabetes mellitus type II insulin dependent, depression, and diabetic neuropathy who presents to [facility] for 04/23/2023 for Acute on chronic respiratory failure with hypoxia ([name]) [J96.21] Acute on chronic diastolic (congestive) heart failure ([name]) [I50.33]  [facility] MWF [address] Left AVG 62kg  End Stage Kidney Disease: continue MWF schedule. Receiving dialysis treatment today, UF 1.5L as tolerated.   Hypertension with chronic kidney disease: current regimen of irbesartan, carvedilol and amlodipine. Home regimen of olmesartan instead of irbesartan.  Midodrine available for dialysis. Blood pressure 151/71 during dialysis  Diabetes mellitus type II with chronic kidney disease: insulin dependent.  - Glucose controled  - Primary team to manage sliding scale insulin.  Secondary Hyperparathyroidism - Continue sevelamer with each meal  Anemia of chronic kidney disease: -Continue EPO 10,000 units with dialysis   LOS: 15   6/5/202412:55 PM

## 2023-05-09 NOTE — Progress Notes (Addendum)
Discharge instructions reviewed with pt who verbalizes understanding.  IV removed.  Pt to be dc'd on 4L portable 02.  Sister will be picking her up shortly.  Pt wanted to have a few bites of dinner before getting changed for discharge.  NT aware to help with changing into clothes after pt eats.  This RN will transition 02 to the portable tank via trach collar when ready to leave.  Sister will meet pt in the lobby in about 30 minutes.  1815:  PT being discharged via WC with portable 02 at 4L via trach collar.  Pt and belongings brought down to the main entrance with this RN and NT via wheelchair.

## 2023-05-09 NOTE — Discharge Summary (Signed)
Physician Discharge Summary   Patient: Deborah [name] MRN: [id number] DOB: [birth year]  Admit date:     04/23/2023  Discharge date: 05/09/23  Discharge Physician: Alford Highland   PCP: Si Gaul, MD   Recommendations at discharge:   Follow-up PCP 5 days Continue dialysis as scheduled [facility] health PT and OT  Discharge Diagnoses: Principal Problem:   Acute on chronic respiratory failure with hypoxia ([name]) Active Problems:   CAP (community acquired pneumonia)   Diarrhea of infectious origin   S/P MVR (mitral valve replacement)   PAF (paroxysmal atrial fibrillation) ([name])   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, W, F   Type 2 diabetes mellitus with chronic kidney disease, with long-term current use of insulin ([name])   Chronic anticoagulation   Tracheostomy status ([name])   Acute on chronic diastolic CHF (congestive heart failure) ([name])   Essential hypertension   Overweight (BMI 25.0-29.9)   Acute on chronic diastolic (congestive) heart failure ([name])   Pseudomonas respiratory infection   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   Hospital Course: 67 year old African-American female history of end-stage renal disease on dialysis Monday Wednesday Friday, paroxysmal atrial fibrillation on Eliquis, history of tracheostomy, chronic diastolic heart failure, chronic hypoxia at night, uncontrolled type 2 diabetes, hypertension, presents the ER today with worsening cough and hypoxia.  Patient was recently in the hospital and discharged 5/19 after treatment for pneumonia. Chest x-ray showed significant volume overload, patient was dialyzed after admission 5/20.   Patient completed a course of antibiotics for Pseudomonas pneumonia.  Patient also received Zithromax for vibrio growing out of the stool culture.  Patient progressed over the 16-day hospital course with improvement of her respiratory status.  With the trach collar here in the hospital she did require a  higher liter flow during the hospital course.  Upon discharge she was on 4 L trach collar and saturated well with ambulation.  Stable for discharge home.  TOC able to set up home oxygen company for her.  We will resume home nursing services through [facility].  Home health PT and OT also arranged.  Assessment and Plan: * Acute on chronic respiratory failure with hypoxia ([name]) Upon discharge patient down to 4 L trach collar with good saturations with ambulation today.  Stable to go home since we were able to set up home oxygen.  Diarrhea of infectious origin Vibrio grew out of stool culture.  Received 1 g of azithromycin  CAP (community acquired pneumonia) Pseudomonas and corynebacterium growing out of culture.  Patient received cefepime for 10 days total.  Completed antibiotics on 04/29/2023.  Essential hypertension Patient can go back on Benicar as outpatient, continue lower dose Coreg  Acute on chronic diastolic CHF (congestive heart failure) ([name]) Stable. Had HD today.  Dialysis to manage fluid.  Tracheostomy status ([name]) Chronic trach. Cannot decannulate per ENT.  Chronic anticoagulation Continue with Eliquis 2.5 mg bid.  Type 2 diabetes mellitus with chronic kidney disease, with long-term current use of insulin ([name]) On Levemir 4 units twice a day.  Last hemoglobin A1c 7.2.  ESRD on dialysis [facility]) - M, W, F Had HD today.  Continue outpatient hemodialysis per her normal schedule.  PAF (paroxysmal atrial fibrillation) ([name]) Stable. On Eliquis. On lower dose coreg  S/P MVR (mitral valve replacement) Stable.  Acute on chronic diastolic (congestive) heart failure ([name]) EF 55%.  Dialysis to manage fluid.  On low-dose Coreg.         Consultants: Nephrology,  pulmonology Procedures performed: Dialysis Disposition: Home health Diet recommendation:  Cardiac and Carb modified diet DISCHARGE MEDICATION: Allergies as of 05/09/2023   No Known Allergies      Medication List      STOP taking these medications    amLODipine 10 MG tablet Commonly known as: NORVASC   levofloxacin 250 MG tablet Commonly known as: Levaquin       TAKE these medications    acetaminophen 325 MG tablet Commonly known as: TYLENOL Take 650 mg by mouth every 6 (six) hours as needed for mild pain or fever.   albuterol (2.5 MG/3ML) 0.083% nebulizer solution Commonly known as: PROVENTIL Take 3 mLs (2.5 mg total) by nebulization 4 (four) times daily - after meals and at bedtime.   apixaban 2.5 MG Tabs tablet Commonly known as: ELIQUIS Take 2.5 mg by mouth 2 (two) times daily.   aspirin EC 81 MG tablet Take 1 tablet (81 mg total) by mouth daily.   atorvastatin 80 MG tablet Commonly known as: LIPITOR Take 80 mg by mouth daily at 6 PM.   carvedilol 6.25 MG tablet Commonly known as: COREG Take 1 tablet (6.25 mg total) by mouth 2 (two) times daily with a meal. What changed:  medication strength how much to take when to take this   escitalopram 10 MG tablet Commonly known as: LEXAPRO Take 10 mg by mouth daily.   gabapentin 300 MG capsule Commonly known as: NEURONTIN Take 1 capsule (300 mg total) by mouth at bedtime.   guaiFENesin-dextromethorphan 100-10 MG/5ML syrup Commonly known as: ROBITUSSIN DM Take 5 mLs by mouth every 4 (four) hours as needed for cough.   Levemir FlexTouch 100 UNIT/ML FlexPen Generic drug: insulin detemir Inject 4 Units into the skin 2 (two) times daily.   Lidocaine (Anorectal) 5 % Crea Sparingly Topical As Directed   lidocaine-prilocaine cream Commonly known as: EMLA Apply 1 application topically every dialysis.   midodrine 5 MG tablet Commonly known as: PROAMATINE Take 5 mg by mouth 3 (three) times a week.   multivitamin Tabs tablet Take 1 tablet by mouth daily.   olmesartan 20 MG tablet Commonly known as: BENICAR Take 20 mg by mouth daily.   pantoprazole 40 MG tablet Commonly known as: PROTONIX Take 1 tablet (40 mg total) by  mouth daily. What changed: when to take this   sevelamer carbonate 800 MG tablet Commonly known as: RENVELA Take 1,600 mg by mouth 3 (three) times daily with meals.   traZODone 50 MG tablet Commonly known as: DESYREL Take 50 mg by mouth 3 (three) times a week. Take on Dialysis days               Durable Medical Equipment  (From admission, onward)           Start     Ordered   05/09/23 1557  For home use only DME oxygen  Once       Comments: Can go up with the liter flow with the trach collar to 6L if needed for pulse ox less than 88%  Question Answer Comment  Length of Need Lifetime   Mode or (Route) Nasal cannula   Liters per Minute 4   Frequency Continuous (stationary and portable oxygen unit needed)   Oxygen conserving device Yes   Oxygen delivery system Gas      05/09/23 1559            Follow-up Information     Si Gaul, MD Follow up in  5 day(s).   Specialty: Internal Medicine Why: appointment on 05/31/23  10am. arrive at 9.45am Contact information: [address] [place] [address] [phone]         Vida Rigger, MD Follow up in 2 week(s).   Specialty: Pulmonary Disease Contact information: [address] [place] [address] [phone]                Discharge Exam: Deborah [name] Weights   05/07/23 1850 05/09/23 0939 05/09/23 1445  Weight: 63.4 kg 65.1 kg 63.7 kg   Physical Exam HENT:     Head: Normocephalic.     Mouth/Throat:     Pharynx: No oropharyngeal exudate.  Eyes:     General: Lids are normal.     Conjunctiva/sclera: Conjunctivae normal.  Cardiovascular:     Rate and Rhythm: Normal rate and regular rhythm.     Heart sounds: Normal heart sounds, S1 normal and S2 normal.  Pulmonary:     Breath sounds: No decreased breath sounds, wheezing, rhonchi or rales.  Abdominal:     Palpations: Abdomen is soft.     Tenderness: There is no abdominal tenderness.  Musculoskeletal:     Right lower leg: No  swelling.     Left lower leg: No swelling.  Skin:    General: Skin is warm.     Findings: No rash.  Neurological:     Mental Status: She is alert and oriented to person, place, and time.      Condition at discharge: stable  The results of significant diagnostics from this hospitalization (including imaging, microbiology, ancillary and laboratory) are listed below for reference.   Imaging Studies: DG Chest Port 1 View  Result Date: 05/06/2023 CLINICAL DATA:  Pleural effusion on the left EXAM: PORTABLE CHEST 1 VIEW COMPARISON:  May 02, 2023 FINDINGS: No pneumothorax. Stable tracheostomy tube. Stable cardiomediastinal silhouette. Bilateral interstitial opacities with more focal opacities in the bases. The findings are stable other than improving opacity in the left base. No other acute abnormalities. IMPRESSION: 1. Cardiomegaly with mild edema, stable in the interval. 2. Improving opacity in the left base. Recommend follow-up to complete resolution. Electronically Signed   By: Gerome Sam III M.D.   On: 05/06/2023 12:45   DG Chest Port 1 View  Result Date: 05/02/2023 CLINICAL DATA:  Follow-up atelectasis EXAM: PORTABLE CHEST 1 VIEW COMPARISON:  04/29/2023 FINDINGS: Cardiac shadow is enlarged but stable. Postsurgical changes are noted. Tracheostomy tube is noted in satisfactory position. Persistent left-sided effusion and basilar opacity is noted. Central vascular congestion is noted with changes of pulmonary edema similar to that noted on the prior exam. No new focal abnormality is seen. IMPRESSION: Stable appearance of the chest when compared with the previous exam. Electronically Signed   By: Alcide Clever M.D.   On: 05/02/2023 23:01   DG Chest Port 1 View  Result Date: 04/29/2023 CLINICAL DATA:  Atelectasis. EXAM: PORTABLE CHEST 1 VIEW COMPARISON:  Radiograph and CT 04/23/2023 FINDINGS: Tracheostomy tube tip at the thoracic inlet. Median sternotomy and prosthetic cardiac valve. Stable  cardiomegaly. Improvement in right pleural effusion. Similar left pleural effusion and basilar opacity. Pulmonary edema is unchanged. No pneumothorax. IMPRESSION: 1. Improvement in right pleural effusion. 2. Unchanged left pleural effusion and basilar opacity. 3. Unchanged pulmonary edema. Electronically Signed   By: Narda Rutherford M.D.   On: 04/29/2023 21:18   ECHOCARDIOGRAM COMPLETE  Result Date: 04/26/2023    ECHOCARDIOGRAM REPORT   Patient Name:   Deborah [name]  Date of Exam: 04/26/2023 Medical Rec #:  [id number]     Height:       63.0 in Accession #:    [id number]    Weight:       142.9 lb Date of Birth:  [birth year]     BSA:          1.676 m Patient Age:    66 years      BP:           129/68 mmHg Patient Gender: F             HR:           73 bpm. Exam Location:  [facility] Procedure: 2D Echo, Cardiac Doppler and Color Doppler Indications:     CHF                  s/p Mitral valve replacement Z95.2  History:         Patient has prior history of Echocardiogram examinations, most                  recent 01/04/2022. Risk Factors:Diabetes and Hypertension. ESRD,                  Mitral valve replacement.  Sonographer:     Cristela Blue Referring Phys:  [id number] Lynn Ito Diagnosing Phys: Julien Nordmann MD IMPRESSIONS  1. Left ventricular ejection fraction, by estimation, is 55 to 60%. The left ventricle has normal function. The left ventricle has no regional wall motion abnormalities. There is moderate left ventricular hypertrophy. Left ventricular diastolic parameters are consistent with Grade II diastolic dysfunction (pseudonormalization).  2. Right ventricular systolic function is normal. The right ventricular size is normal. There is severely elevated pulmonary artery systolic pressure. The estimated right ventricular systolic pressure is 82.8 mmHg.  3. The mitral valve has been repaired/replaced. No evidence of mitral valve regurgitation. No evidence of mitral stenosis. The mean mitral valve gradient is 9.0  mmHg.  4. Tricuspid valve regurgitation is mild to moderate.  5. The aortic valve has an indeterminant number of cusps. Aortic valve regurgitation is not visualized. Aortic valve sclerosis is present, with no evidence of aortic valve stenosis.  6. The inferior vena cava is normal in size with greater than 50% respiratory variability, suggesting right atrial pressure of 3 mmHg. FINDINGS  Left Ventricle: Left ventricular ejection fraction, by estimation, is 55 to 60%. The left ventricle has normal function. The left ventricle has no regional wall motion abnormalities. The left ventricular internal cavity size was normal in size. There is  moderate left ventricular hypertrophy. Left ventricular diastolic parameters are consistent with Grade II diastolic dysfunction (pseudonormalization). Right Ventricle: The right ventricular size is normal. No increase in right ventricular wall thickness. Right ventricular systolic function is normal. There is severely elevated pulmonary artery systolic pressure. The tricuspid regurgitant velocity is 4.41 m/s, and with an assumed right atrial pressure of 5 mmHg, the estimated right ventricular systolic pressure is 82.8 mmHg. Left Atrium: Left atrial size was normal in size. Right Atrium: Right atrial size was normal in size. Pericardium: There is no evidence of pericardial effusion. Mitral Valve: The mitral valve has been repaired/replaced. No evidence of mitral valve regurgitation. 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 No evidence of mitral valve stenosis. MV peak gradient, 18.8 mmHg. The mean mitral valve gradient is 9.0 mmHg. Tricuspid Valve: The tricuspid valve is normal in structure. Tricuspid valve regurgitation is mild to moderate. No  evidence of tricuspid stenosis. Aortic Valve: The aortic valve has an indeterminant number of cusps. Aortic valve regurgitation is not visualized. Aortic valve sclerosis is present, with no evidence of aortic valve  stenosis. Aortic valve mean gradient measures 4.5 mmHg. Aortic valve peak gradient measures 8.7 mmHg. Aortic valve area, by VTI measures 2.20 cm. Pulmonic Valve: The pulmonic valve was normal in structure. Pulmonic valve regurgitation is not visualized. No evidence of pulmonic stenosis. Aorta: The aortic root is normal in size and structure. Venous: The inferior vena cava is normal in size with greater than 50% respiratory variability, suggesting right atrial pressure of 3 mmHg. IAS/Shunts: No atrial level shunt detected by color flow Doppler.  LEFT VENTRICLE PLAX 2D LVIDd:         3.90 cm   Diastology LVIDs:         2.30 cm   LV e' medial:    7.40 cm/s LV PW:         1.60 cm   LV E/e' medial:  24.2 LV IVS:        1.50 cm   LV e' lateral:   7.94 cm/s LVOT diam:     2.10 cm   LV E/e' lateral: 22.5 LV SV:         62 LV SV Index:   37 LVOT Area:     3.46 cm  RIGHT VENTRICLE RV Basal diam:  4.50 cm RV Mid diam:    3.40 cm RV S prime:     11.60 cm/s TAPSE (M-mode): 2.6 cm LEFT ATRIUM           Index        RIGHT ATRIUM           Index LA diam:      3.70 cm 2.21 cm/m   RA Area:     20.70 cm LA Vol (A2C): 14.1 ml 8.41 ml/m   RA Volume:   65.80 ml  39.26 ml/m LA Vol (A4C): 30.9 ml 18.44 ml/m  AORTIC VALVE AV Area (Vmax):    1.97 cm AV Area (Vmean):   2.14 cm AV Area (VTI):     2.20 cm AV Vmax:           147.50 cm/s AV Vmean:          97.250 cm/s AV VTI:            0.282 m AV Peak Grad:      8.7 mmHg AV Mean Grad:      4.5 mmHg LVOT Vmax:         83.80 cm/s LVOT Vmean:        60.000 cm/s LVOT VTI:          0.179 m LVOT/AV VTI ratio: 0.63  AORTA Ao Root diam: 3.10 cm MITRAL VALVE                TRICUSPID VALVE MV Area (PHT): 2.94 cm     TR Peak grad:   77.8 mmHg MV Area VTI:   1.06 cm     TR Vmax:        441.00 cm/s MV Peak grad:  18.8 mmHg MV Mean grad:  9.0 mmHg     SHUNTS MV Vmax:       2.17 m/s     Systemic VTI:  0.18 m MV Vmean:      140.0 cm/s   Systemic Diam: 2.10 cm MV Decel Time: 258 msec MV E velocity:  179.00 cm/s  MV A velocity: 149.00 cm/s MV E/A ratio:  1.20 Julien Nordmann MD Electronically signed by Julien Nordmann MD Signature Date/Time: 04/26/2023/12:58:36 PM    Final    CT CHEST WO CONTRAST  Result Date: 04/23/2023 CLINICAL DATA:  Respiratory illness, nondiagnostic xray, dyspnea EXAM: CT CHEST WITHOUT CONTRAST TECHNIQUE: Multidetector CT imaging of the chest was performed following the standard protocol without IV contrast. RADIATION DOSE REDUCTION: This exam was performed according to the departmental dose-optimization program which includes automated exposure control, adjustment of the mA and/or kV according to patient size and/or use of iterative reconstruction technique. COMPARISON:  04/18/2023 FINDINGS: Cardiovascular: Moderate multi-vessel coronary artery calcification. Mitral valve replacement has been performed. Mild global cardiomegaly. No pericardial effusion. Central pulmonary arteries are enlarged in keeping with changes of pulmonary arterial hypertension. Mild atherosclerotic calcification within the thoracic aorta. No aortic aneurysm. Mediastinum/Nodes: Tracheostomy in expected position. Shotty right paratracheal, aortopulmonary, and subcarinal adenopathy is nonspecific, possibly reactive in nature. This appears progressive since prior examination. Visualized thyroid is unremarkable. The esophagus is unremarkable. Lungs/Pleura: Small bilateral pleural effusions are present, enlarged since prior examination, with increasing associated bibasilar compressive atelectasis. Interval development of mild interstitial and alveolar pulmonary edema with smooth interlobular septal thickening and diffuse ground-glass infiltrate. Increasing superimposed atelectasis within the left upper lobe. No pneumothorax. No central obstructing lesion. Upper Abdomen: No acute abnormality. Musculoskeletal: No acute bone abnormality. No lytic or blastic bone lesion. IMPRESSION: 1. Interval development of mild  interstitial and alveolar pulmonary edema, small but enlarging bilateral pleural effusions, and mild global cardiomegaly in keeping with mild congestive heart failure. 2. Moderate multi-vessel coronary artery calcification. 3. Morphologic changes in keeping with pulmonary arterial hypertension. 4. Shotty mediastinal adenopathy, progressive since prior examination, possibly reactive in nature. Aortic Atherosclerosis (ICD10-I70.0). Electronically Signed   By: Helyn Numbers M.D.   On: 04/23/2023 20:55   DG Chest Portable 1 View  Result Date: 04/23/2023 CLINICAL DATA:  67 year old female with shortness of breath. Recent hospitalization. EXAM: PORTABLE CHEST 1 VIEW COMPARISON:  Chest CT 04/18/2023. FINDINGS: Portable AP upright view at 0927 hours. Stable tracheostomy. Cardiac prosthetic valve and cardiomegaly. Stable cardiac size and mediastinal contours. Increasing bilateral lung base veiling opacity typical of a fusions, lower lung vascular congestion. Pleural effusion volume still probably small. Upper lung vascular congestion appears mildly improved from the previous chest x-ray. No pneumothorax. No definite air bronchograms. No acute osseous abnormality identified. Negative visible bowel gas. IMPRESSION: 1. Cardiomegaly with small bilateral pleural effusions and lung base atelectasis. But otherwise pulmonary vascular congestion appears improved since 04/18/2023. 2. Stable tracheostomy. Electronically Signed   By: Odessa Fleming M.D.   On: 04/23/2023 09:38   CT Chest Wo Contrast  Result Date: 04/18/2023 CLINICAL DATA:  Shortness of breath EXAM: CT CHEST WITHOUT CONTRAST TECHNIQUE: Multidetector CT imaging of the chest was performed following the standard protocol without IV contrast. RADIATION DOSE REDUCTION: This exam was performed according to the departmental dose-optimization program which includes automated exposure control, adjustment of the mA and/or kV according to patient size and/or use of iterative  reconstruction technique. COMPARISON:  Previous studies including the CT done on 11-Nov-2020 and chest radiograph done earlier today FINDINGS: Cardiovascular: There is ectasia of the main pulmonary artery measuring 3.6 cm suggesting pulmonary arterial hypertension. Coronary artery calcifications are seen. 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 Heart is enlarged in size. Metallic sutures are seen in the sternum. Mediastinum/Nodes: There are slightly enlarged nodes in mediastinum. This finding has not changed significantly. Lungs/Pleura: Tip of tracheostomy tube  is slightly above the level of aortic arch. Small right pleural effusion is seen. Minimal left pleural effusion is seen. There are patchy infiltrates in both perihilar regions and both lower lung fields, more so in the right lower lobe suggesting atelectasis/pneumonia. There is no pneumothorax. Upper Abdomen: Small hiatal hernia is seen. There are small calcifications in liver and spleen, possibly granulomas. Musculoskeletal: Sclerotic density seen in the spinous process of T2 vertebra has not changed, possibly benign bone island. There is subcutaneous edema and skin thickening in both breasts without any loculated fluid collections. Degenerative changes are noted in the visualized lower cervical spine with encroachment of neural foramina. IMPRESSION: Cardiomegaly. Coronary artery disease. There is ectasia of the main pulmonary artery suggesting possible pulmonary arterial hypertension. Small right pleural effusion. Minimal left pleural effusion. There are patchy infiltrates in mid and lower lung fields, more so in right lower lobe suggesting atelectasis/pneumonia. Small hiatal hernia. Other findings as described in the body of the report. Electronically Signed   By: Ernie Avena M.D.   On: 04/18/2023 20:23   DG Chest 2 View  Result Date: 04/18/2023 CLINICAL DATA:  Shortness of breath EXAM: CHEST - 2 VIEW COMPARISON:  Chest x-ray 01/04/2022 FINDINGS:  Tracheostomy is unchanged in position. Patient is status post heart valve replacement. The heart is enlarged. There central pulmonary vascular congestion. There are increasing patchy opacities in both lung bases with new trace bilateral pleural effusions. No pneumothorax or acute fracture. Vascular stent/graft seen in the proximal left arm. IMPRESSION: 1. Cardiomegaly with central pulmonary vascular congestion. 2. Increasing patchy opacities in both lung bases with new trace bilateral pleural effusions. Electronically Signed   By: Darliss Cheney M.D.   On: 04/18/2023 16:04    Microbiology: Results for orders placed or performed during the hospital encounter of 04/23/23  Organism ID, Bacteria     Status: None (Preliminary result)   Collection Time: 04/18/23  8:20 PM  Result Value Ref Range Status   Organism ID, Bacteria REFERT  Final    Comment: (NOTE) Please refer to the following specimen for additional lab results. Please refer to spec # [id number] for test result. Camelia Moraru notified 05-02-2023 at 13:00 Performed At: [facility] [address] [place], [place] [id number] Jolene Schimke MD MV:[id number]    Source of Sample PENDING  Incomplete  Bacterial organism reflex     Status: None   Collection Time: 04/18/23  8:20 PM  Result Value Ref Range Status   Bacterial result 1 Comment  Final    Comment: (NOTE) Specimen has been received and testing has been initiated. Performed At: [facility] [address] [place], [place] [id number] Jolene Schimke MD [id number]:[id number]   Organism ID by MALDI     Status: None (Preliminary result)   Collection Time: 04/18/23  8:20 PM  Result Value Ref Range Status   Organism ID by MALDI Comment  Final    Comment: (NOTE) Specimen has been received. Testing has been initiated. Performed At: [facility] [address] [place], [place] [id number] Jolene Schimke MD OV:[id number]    Source (MTB RIF) PENDING  Incomplete   Aerobic ID by MALDI     Status: None   Collection Time: 04/18/23  8:20 PM  Result Value Ref Range Status   Aerobic ID by MALDI Comment  Final    Comment: (NOTE) Unable to identify by MALDI. See Organism ID by Sequencing. Performed At: [facility] [address] [place], [place] [id number] Jolene Schimke MD ZS:[id number]  SARS Coronavirus 2 by RT PCR (hospital order, performed in [facility] hospital lab) *cepheid single result test* Anterior Nasal Swab     Status: None   Collection Time: 04/23/23  9:30 AM   Specimen: Anterior Nasal Swab  Result Value Ref Range Status   SARS Coronavirus 2 by RT PCR NEGATIVE NEGATIVE Final    Comment: (NOTE) SARS-CoV-2 target nucleic acids are NOT DETECTED.  The SARS-CoV-2 RNA is generally detectable in upper and lower respiratory specimens during the acute phase of infection. The lowest concentration of SARS-CoV-2 viral copies this assay can detect is 250 copies / mL. A negative result does not preclude SARS-CoV-2 infection and should not be used as the sole basis for treatment or other patient management decisions.  A negative result may occur with improper specimen collection / handling, submission of specimen other than nasopharyngeal swab, presence of viral mutation(s) within the areas targeted by this assay, and inadequate number of viral copies (<250 copies / mL). A negative result must be combined with clinical observations, patient history, and epidemiological information.  Fact Sheet for Patients:   RoadLapTop.co.za  Fact Sheet for Healthcare Providers: http://kim-miller.com/  This test is not yet approved or  cleared by the [place] FDA and has been authorized for detection and/or diagnosis of SARS-CoV-2 by FDA under an Emergency Use Authorization (EUA).  This EUA will remain in effect (meaning this test can be used) for the duration of the COVID-19 declaration under Section  564(b)(1) of the Act, 21 U.S.C. section 360bbb-3(b)(1), unless the authorization is terminated or revoked sooner.  Performed at [facility], [address] [place], [place] [address]   Culture, blood (Routine X 2) w Reflex to ID Panel     Status: None   Collection Time: 04/24/23  7:52 PM   Specimen: BLOOD  Result Value Ref Range Status   Specimen Description BLOOD RIGHT ANTECUBITAL  Final   Special Requests   Final    BOTTLES DRAWN AEROBIC AND ANAEROBIC Blood Culture adequate volume   Culture   Final    NO GROWTH 5 DAYS Performed at [facility], [address] [place], [place] [address]    Report Status 04/29/2023 FINAL  Final  Culture, blood (Routine X 2) w Reflex to ID Panel     Status: None   Collection Time: 04/24/23  7:56 PM   Specimen: BLOOD  Result Value Ref Range Status   Specimen Description BLOOD RIGHT ANTECUBITAL  Final   Special Requests   Final    BOTTLES DRAWN AEROBIC AND ANAEROBIC Blood Culture adequate volume   Culture   Final    NO GROWTH 5 DAYS Performed at [facility], [address] [place], [place] [address]    Report Status 04/29/2023 FINAL  Final  Respiratory (~20 pathogens) panel by PCR     Status: None   Collection Time: 04/25/23  2:01 PM   Specimen: Nasopharyngeal Swab; Respiratory  Result Value Ref Range Status   Adenovirus NOT DETECTED NOT DETECTED Final   Coronavirus 229E NOT DETECTED NOT DETECTED Final    Comment: (NOTE) The Coronavirus on the Respiratory Panel, DOES NOT test for the novel  Coronavirus (2019 nCoV)    Coronavirus HKU1 NOT DETECTED NOT DETECTED Final   Coronavirus NL63 NOT DETECTED NOT DETECTED Final   Coronavirus OC43 NOT DETECTED NOT DETECTED Final   Metapneumovirus NOT DETECTED NOT DETECTED Final   Rhinovirus / Enterovirus NOT DETECTED NOT DETECTED Final   Influenza A NOT  DETECTED NOT DETECTED Final   Influenza B NOT DETECTED NOT DETECTED Final   Parainfluenza Virus 1 NOT DETECTED NOT  DETECTED Final   Parainfluenza Virus 2 NOT DETECTED NOT DETECTED Final   Parainfluenza Virus 3 NOT DETECTED NOT DETECTED Final   Parainfluenza Virus 4 NOT DETECTED NOT DETECTED Final   Respiratory Syncytial Virus NOT DETECTED NOT DETECTED Final   Bordetella pertussis NOT DETECTED NOT DETECTED Final   Bordetella Parapertussis NOT DETECTED NOT DETECTED Final   Chlamydophila pneumoniae NOT DETECTED NOT DETECTED Final   Mycoplasma pneumoniae NOT DETECTED NOT DETECTED Final    Comment: Performed at [facility], [address], [place], [place] [address]  Gastrointestinal Panel by PCR , Stool     Status: Abnormal   Collection Time: 04/26/23  6:56 AM   Specimen: Stool  Result Value Ref Range Status   Campylobacter species NOT DETECTED NOT DETECTED Final   Plesimonas shigelloides NOT DETECTED NOT DETECTED Final   Salmonella species NOT DETECTED NOT DETECTED Final   Yersinia enterocolitica NOT DETECTED NOT DETECTED Final   Vibrio species DETECTED (A) NOT DETECTED Final    Comment: RESULT CALLED TO, READ BACK BY AND VERIFIED WITH: JOSEPH HAYES AT 6045 04/29/23.PMF    Vibrio cholerae DETECTED (A) NOT DETECTED Final    Comment: RESULT CALLED TO, READ BACK BY AND VERIFIED WITH: JOSEPH HAYES AT 4098 04/29/23.PMF    Enteroaggregative E coli (EAEC) NOT DETECTED NOT DETECTED Final   Enteropathogenic E coli (EPEC) NOT DETECTED NOT DETECTED Final   Enterotoxigenic E coli (ETEC) NOT DETECTED NOT DETECTED Final   Shiga like toxin producing E coli (STEC) NOT DETECTED NOT DETECTED Final   Shigella/Enteroinvasive E coli (EIEC) NOT DETECTED NOT DETECTED Final   Cryptosporidium NOT DETECTED NOT DETECTED Final   Cyclospora cayetanensis NOT DETECTED NOT DETECTED Final   Entamoeba histolytica NOT DETECTED NOT DETECTED Final   Giardia lamblia NOT DETECTED NOT DETECTED Final   Adenovirus F40/41 NOT DETECTED NOT DETECTED Final   Astrovirus NOT DETECTED NOT DETECTED Final   Norovirus GI/GII NOT DETECTED NOT  DETECTED Final   Rotavirus A NOT DETECTED NOT DETECTED Final   Sapovirus (I, II, IV, and V) NOT DETECTED NOT DETECTED Final    Comment: Performed at [facility], [name] [place], [place] [address]  Stool culture     Status: None   Collection Time: 05/01/23  8:51 PM   Specimen: Stool  Result Value Ref Range Status   Salmonella/Shigella Screen Final report  Final   Campylobacter Culture Final report  Final   E coli, Shiga toxin Assay Negative Negative Final    Comment: (NOTE) Performed At: [facility] [address] [place], [place] [id number] Jolene Schimke MD [id number]   STOOL CULTURE REFLEX - RSASHR     Status: None   Collection Time: 05/01/23  8:51 PM  Result Value Ref Range Status   Stool Culture result 1 (RSASHR) Comment  Final    Comment: (NOTE) No Salmonella or Shigella recovered. Performed At: [facility] [address] [place], [place] [id number] Jolene Schimke MD WN:[id number]   STOOL CULTURE Reflex - CMPCXR     Status: None   Collection Time: 05/01/23  8:51 PM  Result Value Ref Range Status   Stool Culture result 1 (CMPCXR) Comment  Final    Comment: (NOTE) No Campylobacter species isolated. Performed At: [facility] [address] [place], [place] [id number] Jolene Schimke MD [id number]     Labs: CBC:  Recent Labs  Lab 05/04/23 1626 05/09/23 0736  WBC 9.1 7.1  HGB 7.8* 8.3*  HCT 24.4* 25.7*  MCV 95.3 93.5  PLT 316 338   Basic Metabolic Panel: Recent Labs  Lab 05/04/23 1626 05/09/23 0736  NA 134* 135  K 4.6 4.0  CL 95* 97*  CO2 27 28  GLUCOSE 237* 157*  BUN 42* 44*  CREATININE 8.69* 8.59*  CALCIUM 8.7* 8.9  PHOS 5.2* 5.6*   Liver Function Tests: Recent Labs  Lab 05/04/23 1626 05/09/23 0736  ALBUMIN 3.0* 3.0*   CBG: Recent Labs  Lab 05/08/23 1316 05/08/23 1738 05/08/23 2137 05/09/23 0835 05/09/23 1542  GLUCAP 235* 172* 133* 148* 138*    Discharge time spent: greater than 30  minutes.  Signed: Alford Highland, MD [facility] 05/09/2023

## 2023-05-09 NOTE — Progress Notes (Signed)
PT Cancellation Note  Patient Details Name: INSHA SALLS MRN: [id number] DOB: 15-May-1956   Cancelled Treatment:    Reason Eval/Treat Not Completed: Patient at procedure or test/unavailable.  Pt currently out of the room for dialysis. PT to follow up when pt is next available for PT intervention.   Nolon Bussing, PT, DPT Physical Therapist - [facility]  05/09/23, 11:53 AM

## 2023-05-09 NOTE — Progress Notes (Signed)
SATURATION QUALIFICATIONS: (This note is used to comply with regulatory documentation for home oxygen)  Patient Saturations on Room Air at Rest = 86%  Patient Saturations on Room Air while Ambulating = 86 %  Patient Saturations on 4  Liters of oxygen while Ambulating = 94%  Please briefly explain why patient needs home oxygen:  Desat on RA with minimal activity.  Recovered when oxygen reapplied

## 2023-05-09 NOTE — Assessment & Plan Note (Signed)
Vibrio grew out of stool culture.  Received 1 g of azithromycin

## 2023-05-09 NOTE — Progress Notes (Signed)
Pt placed in transport at 0729.  As of [id number] pt not arrived in HD

## 2023-05-09 NOTE — Progress Notes (Signed)
OT Cancellation Note  Patient Details Name: Deborah [name] MRN: [id number] DOB: Mar 25, 1956   Cancelled Treatment:    Reason Eval/Treat Not Completed: Patient at procedure or test/ unavailable. Pt currently out of the room for hemodialysis. Will attempt OT at a later time/date, as pt is available.   [name] 05/09/2023, 1:30 PM

## 2023-05-09 NOTE — Progress Notes (Signed)
Physical Therapy Treatment Patient Details Name: Deborah [name] MRN: [id number] DOB: [birth year] Today's Date: 05/09/2023   History of Present Illness 67 year old African-American female history of end-stage renal disease on dialysis Monday Wednesday Friday, paroxysmal atrial fibrillation on Eliquis, history of tracheostomy, chronic diastolic heart failure, chronic hypoxia at night, uncontrolled type 2 diabetes, hypertension, presents the ER today with worsening cough and hypoxia.  Patient was recently discharged the hospital yesterday after spending 5 days in the hospital with pneumonia. Workup for fluid overload, CHF exacerbation.    PT Comments    Pt received in Semi-Fowler's position and agreeable to therapy.  Pt agreeable to performing mobility around the room in order to assess for any desaturation.  Pt able to ambulate around the room and near the hallway door without any difficulty.  Pt remained close to room due to having dialysis earlier today and attempting to prevent syncopal event.  Pt performed well with the 4L of O2 and O2 saturation levels remained >92%.  Pt left in room with all needs met and call bell within reach.       Recommendations for follow up therapy are one component of a multi-disciplinary discharge planning process, led by the attending physician.  Recommendations may be updated based on patient status, additional functional criteria and insurance authorization.  Follow Up Recommendations       Assistance Recommended at Discharge Intermittent Supervision/Assistance  Patient can return home with the following Assistance with cooking/housework;Assist for transportation;Help with stairs or ramp for entrance;A little help with walking and/or transfers   Equipment Recommendations  None recommended by PT    Recommendations for Other Services       Precautions / Restrictions Precautions Precautions: Fall Restrictions Weight Bearing Restrictions: No     Mobility   Bed Mobility Overal bed mobility: Needs Assistance Bed Mobility: Supine to Sit     Supine to sit: Supervision          Transfers Overall transfer level: Needs assistance Equipment used: Rolling walker (2 wheels) Transfers: Sit to/from Stand Sit to Stand: Supervision                Ambulation/Gait Ambulation/Gait assistance: Supervision Gait Distance (Feet): 80 Feet Assistive device: Rolling walker (2 wheels) Gait Pattern/deviations: WFL(Within Functional Limits), Step-through pattern, Trunk flexed Gait velocity: decreased         Stairs             Wheelchair Mobility    Modified Rankin (Stroke Patients Only)       Balance Overall balance assessment: Needs assistance Sitting-balance support: No upper extremity supported, Feet supported Sitting balance-Leahy Scale: Fair     Standing balance support: Single extremity supported, Bilateral upper extremity supported, During functional activity, Reliant on assistive device for balance Standing balance-Leahy Scale: Fair                              Cognition Arousal/Alertness: Awake/alert Behavior During Therapy: WFL for tasks assessed/performed Overall Cognitive Status: Within Functional Limits for tasks assessed                                          Exercises      General Comments General comments (skin integrity, edema, etc.): Pt did not desaturate during treatment session with ambulation around the room and near hallway.  Pertinent Vitals/Pain Pain Assessment Pain Assessment: No/denies pain    Home Living                          Prior Function            PT Goals (current goals can now be found in the care plan section) Acute Rehab PT Goals Patient Stated Goal: to go home PT Goal Formulation: With patient Time For Goal Achievement: 05/17/23 Potential to Achieve Goals: Good Progress towards PT goals: Progressing toward goals     Frequency    Min 2X/week      PT Plan Current plan remains appropriate    Co-evaluation              AM-PAC PT "6 Clicks" Mobility   Outcome Measure  Help needed turning from your back to your side while in a flat bed without using bedrails?: A Little Help needed moving from lying on your back to sitting on the side of a flat bed without using bedrails?: A Little Help needed moving to and from a bed to a chair (including a wheelchair)?: None Help needed standing up from a chair using your arms (e.g., wheelchair or bedside chair)?: None Help needed to walk in hospital room?: None Help needed climbing 3-5 steps with a railing? : A Little 6 Click Score: 21    End of Session Equipment Utilized During Treatment: Oxygen;Other (comment) Activity Tolerance: Patient tolerated treatment well Patient left: with call bell/phone within reach;in bed;with bed alarm set Nurse Communication: Mobility status PT Visit Diagnosis: Other abnormalities of gait and mobility (R26.89);Muscle weakness (generalized) (M62.81)     Time: 1610-9604 PT Time Calculation (min) (ACUTE ONLY): 24 min  Charges:  $Therapeutic Exercise: 23-37 mins                     Nolon Bussing, PT, DPT Physical Therapist - [facility]  [facility]  05/09/23, 4:13 PM

## 2023-05-09 NOTE — TOC Transition Note (Signed)
Transition of Care [facility]) - CM/SW Discharge Note   Patient Details  Name: Deborah [name] MRN: [id number] Date of Birth: Mar 13, 1956  Transition of Care [facility]) CM/SW Contact:  Margarito Liner, LCSW Phone Number: 05/09/2023, 4:39 PM   Clinical Narrative:  Patient has orders to discharge home today. [employer] has delivered a tank to the room and has arranged for her to have a 10 L concentrator at home. Asked RN to enter sats note. [facility] and Private Duty Nursing are aware of plan to discharge today. Asked MD to document plan to resume [facility] at discharge. No further concerns. CSW signing off.   Final next level of care: Home w Home Health Services Barriers to Discharge: Barriers Resolved   Patient Goals and CMS Choice   Choice offered to / list presented to : Patient  Discharge Placement                  Patient to be transferred to facility by: Son   Patient and family notified of of transfer: 05/09/23  Discharge Plan and Services Additional resources added to the After Visit Summary for       Post Acute Care Choice: Resumption of Svcs/PTA Provider          DME Arranged: Oxygen DME Agency: Patsy Lager Date DME Agency Contacted: 05/09/23   Representative spoke with at DME Agency: Arvella Merles HH Arranged: PT, OT [facility] Agency: [facility] Date [facility] Agency Contacted: 05/09/23   Representative spoke with at [facility] Agency: Lorenza Chick  Social Determinants of Health (SDOH) Interventions SDOH Screenings   Food Insecurity: No Food Insecurity (04/24/2023)  Housing: Patient Declined (04/24/2023)  Transportation Needs: No Transportation Needs (04/24/2023)  Utilities: Not At Risk (04/24/2023)  Tobacco Use: Low Risk  (04/24/2023)     Readmission Risk Interventions    04/27/2023    4:18 PM 11/04/2020    9:20 AM  Readmission Risk Prevention Plan  Transportation Screening Complete Complete  PCP or Specialist Appt within 3-5 Days Complete   HRI or Home  Care Consult Complete   Social Work Consult for Recovery Care Planning/Counseling Complete   Palliative Care Screening Not Applicable   Medication Review Oceanographer) Complete   PCP or Specialist appointment within 3-5 days of discharge  Complete  SW Recovery Care/Counseling Consult  Complete  Palliative Care Screening  Not Applicable  Skilled Nursing Facility  Not Applicable

## 2023-05-10 ENCOUNTER — Observation Stay: Payer: 59

## 2023-05-10 ENCOUNTER — Encounter: Payer: Self-pay | Admitting: Emergency Medicine

## 2023-05-10 ENCOUNTER — Other Ambulatory Visit: Payer: Self-pay

## 2023-05-10 ENCOUNTER — Emergency Department: Payer: 59

## 2023-05-10 ENCOUNTER — Emergency Department
Admission: EM | Admit: 2023-05-10 | Discharge: 2023-05-11 | Disposition: A | Discharge status: Home / Self Care | Payer: 59 | Attending: Student in an Organized Health Care Education/Training Program | Admitting: Student in an Organized Health Care Education/Training Program

## 2023-05-10 DIAGNOSIS — F32A Depression, unspecified: Secondary | ICD-10-CM | POA: Diagnosis present

## 2023-05-10 DIAGNOSIS — Z952 Presence of prosthetic heart valve: Secondary | ICD-10-CM | POA: Diagnosis not present

## 2023-05-10 DIAGNOSIS — N186 End stage renal disease: Secondary | ICD-10-CM | POA: Insufficient documentation

## 2023-05-10 DIAGNOSIS — Z930 Tracheostomy status: Secondary | ICD-10-CM | POA: Insufficient documentation

## 2023-05-10 DIAGNOSIS — Z7982 Long term (current) use of aspirin: Secondary | ICD-10-CM | POA: Diagnosis not present

## 2023-05-10 DIAGNOSIS — Z79899 Other long term (current) drug therapy: Secondary | ICD-10-CM | POA: Diagnosis not present

## 2023-05-10 DIAGNOSIS — I5031 Acute diastolic (congestive) heart failure: Secondary | ICD-10-CM

## 2023-05-10 DIAGNOSIS — D631 Anemia in chronic kidney disease: Secondary | ICD-10-CM | POA: Diagnosis not present

## 2023-05-10 DIAGNOSIS — I132 Hypertensive heart and chronic kidney disease with heart failure and with stage 5 chronic kidney disease, or end stage renal disease: Secondary | ICD-10-CM | POA: Insufficient documentation

## 2023-05-10 DIAGNOSIS — I2510 Atherosclerotic heart disease of native coronary artery without angina pectoris: Secondary | ICD-10-CM | POA: Insufficient documentation

## 2023-05-10 DIAGNOSIS — I482 Chronic atrial fibrillation, unspecified: Secondary | ICD-10-CM | POA: Diagnosis not present

## 2023-05-10 DIAGNOSIS — D638 Anemia in other chronic diseases classified elsewhere: Secondary | ICD-10-CM | POA: Diagnosis present

## 2023-05-10 DIAGNOSIS — Z992 Dependence on renal dialysis: Secondary | ICD-10-CM | POA: Insufficient documentation

## 2023-05-10 DIAGNOSIS — I5033 Acute on chronic diastolic (congestive) heart failure: Secondary | ICD-10-CM | POA: Diagnosis not present

## 2023-05-10 DIAGNOSIS — I480 Paroxysmal atrial fibrillation: Secondary | ICD-10-CM | POA: Insufficient documentation

## 2023-05-10 DIAGNOSIS — E119 Type 2 diabetes mellitus without complications: Secondary | ICD-10-CM

## 2023-05-10 DIAGNOSIS — E1122 Type 2 diabetes mellitus with diabetic chronic kidney disease: Secondary | ICD-10-CM | POA: Diagnosis not present

## 2023-05-10 DIAGNOSIS — R0602 Shortness of breath: Secondary | ICD-10-CM | POA: Diagnosis present

## 2023-05-10 DIAGNOSIS — E1140 Type 2 diabetes mellitus with diabetic neuropathy, unspecified: Secondary | ICD-10-CM | POA: Diagnosis not present

## 2023-05-10 DIAGNOSIS — Z7901 Long term (current) use of anticoagulants: Secondary | ICD-10-CM | POA: Insufficient documentation

## 2023-05-10 DIAGNOSIS — E1169 Type 2 diabetes mellitus with other specified complication: Secondary | ICD-10-CM

## 2023-05-10 DIAGNOSIS — Z794 Long term (current) use of insulin: Secondary | ICD-10-CM | POA: Insufficient documentation

## 2023-05-10 DIAGNOSIS — I272 Pulmonary hypertension, unspecified: Secondary | ICD-10-CM | POA: Insufficient documentation

## 2023-05-10 DIAGNOSIS — J9621 Acute and chronic respiratory failure with hypoxia: Secondary | ICD-10-CM | POA: Insufficient documentation

## 2023-05-10 DIAGNOSIS — I10 Essential (primary) hypertension: Secondary | ICD-10-CM | POA: Diagnosis present

## 2023-05-10 LAB — CBC WITH DIFFERENTIAL/PLATELET
Abs Immature Granulocytes: 0.03 10*3/uL (ref 0.00–0.07)
Basophils Absolute: 0.1 10*3/uL (ref 0.0–0.1)
Basophils Relative: 1 %
Eosinophils Absolute: 0.4 10*3/uL (ref 0.0–0.5)
Eosinophils Relative: 5 %
HCT: 28.8 % — ABNORMAL LOW (ref 36.0–46.0)
Hemoglobin: 9.2 g/dL — ABNORMAL LOW (ref 12.0–15.0)
Immature Granulocytes: 0 %
Lymphocytes Relative: 12 %
Lymphs Abs: 0.9 10*3/uL (ref 0.7–4.0)
MCH: 30.6 pg (ref 26.0–34.0)
MCHC: 31.9 g/dL (ref 30.0–36.0)
MCV: 95.7 fL (ref 80.0–100.0)
Monocytes Absolute: 1 10*3/uL (ref 0.1–1.0)
Monocytes Relative: 14 %
Neutro Abs: 5 10*3/uL (ref 1.7–7.7)
Neutrophils Relative %: 68 %
Platelets: 330 10*3/uL (ref 150–400)
RBC: 3.01 MIL/uL — ABNORMAL LOW (ref 3.87–5.11)
RDW: 18 % — ABNORMAL HIGH (ref 11.5–15.5)
WBC: 7.5 10*3/uL (ref 4.0–10.5)
nRBC: 0 % (ref 0.0–0.2)

## 2023-05-10 LAB — COMPREHENSIVE METABOLIC PANEL
ALT: 20 U/L (ref 0–44)
AST: 20 U/L (ref 15–41)
Albumin: 3.3 g/dL — ABNORMAL LOW (ref 3.5–5.0)
Alkaline Phosphatase: 70 U/L (ref 38–126)
Anion gap: 12 (ref 5–15)
BUN: 34 mg/dL — ABNORMAL HIGH (ref 8–23)
CO2: 27 mmol/L (ref 22–32)
Calcium: 9.6 mg/dL (ref 8.9–10.3)
Chloride: 97 mmol/L — ABNORMAL LOW (ref 98–111)
Creatinine, Ser: 6.57 mg/dL — ABNORMAL HIGH (ref 0.44–1.00)
GFR, Estimated: 7 mL/min — ABNORMAL LOW (ref >60)
Glucose, Bld: 140 mg/dL — ABNORMAL HIGH (ref 70–99)
Potassium: 4 mmol/L (ref 3.5–5.1)
Sodium: 136 mmol/L (ref 135–145)
Total Bilirubin: 1.4 mg/dL — ABNORMAL HIGH (ref 0.3–1.2)
Total Protein: 7.3 g/dL (ref 6.5–8.1)

## 2023-05-10 LAB — BRAIN NATRIURETIC PEPTIDE: B Natriuretic Peptide: 2541.2 pg/mL — ABNORMAL HIGH (ref 0.0–100.0)

## 2023-05-10 LAB — CBG MONITORING, ED: Glucose-Capillary: 129 mg/dL — ABNORMAL HIGH (ref 70–99)

## 2023-05-10 LAB — LACTIC ACID, PLASMA: Lactic Acid, Venous: 1.2 mmol/L (ref 0.5–1.9)

## 2023-05-10 MED ORDER — GABAPENTIN 300 MG PO CAPS
300.0000 mg | ORAL_CAPSULE | Freq: Every day | ORAL | Status: DC
  Administered 2023-05-10: 300 mg via ORAL
  Filled 2023-05-10: qty 1

## 2023-05-10 MED ORDER — APIXABAN 2.5 MG PO TABS
2.5000 mg | ORAL_TABLET | Freq: Two times a day (BID) | ORAL | Status: DC
  Administered 2023-05-10: 2.5 mg via ORAL
  Filled 2023-05-10 (×2): qty 1

## 2023-05-10 MED ORDER — IRBESARTAN 150 MG PO TABS
150.0000 mg | ORAL_TABLET | Freq: Every day | ORAL | Status: DC
  Administered 2023-05-10 – 2023-05-11 (×2): 150 mg via ORAL
  Filled 2023-05-10 (×2): qty 1

## 2023-05-10 MED ORDER — ONDANSETRON HCL 4 MG/2ML IJ SOLN: 4.0000 mg | Freq: Four times a day (QID) | INTRAMUSCULAR | Status: DC | PRN

## 2023-05-10 MED ORDER — PENTAFLUOROPROP-TETRAFLUOROETH EX AERO
INHALATION_SPRAY | CUTANEOUS | Status: AC
  Filled 2023-05-10: qty 30

## 2023-05-10 MED ORDER — CARVEDILOL 6.25 MG PO TABS
6.2500 mg | ORAL_TABLET | Freq: Two times a day (BID) | ORAL | Status: DC
  Filled 2023-05-10: qty 1

## 2023-05-10 MED ORDER — TRAZODONE HCL 50 MG PO TABS: 50.0000 mg | ORAL_TABLET | ORAL | Status: DC

## 2023-05-10 MED ORDER — SODIUM CHLORIDE 0.9 % IV SOLN: 250.0000 mL | INTRAVENOUS | Status: DC | PRN

## 2023-05-10 MED ORDER — RENA-VITE PO TABS
1.0000 | ORAL_TABLET | Freq: Every day | ORAL | Status: DC
  Administered 2023-05-10 – 2023-05-11 (×2): 1 via ORAL
  Filled 2023-05-10 (×2): qty 1

## 2023-05-10 MED ORDER — HEPARIN SODIUM (PORCINE) 5000 UNIT/ML IJ SOLN: 5000.0000 [IU] | Freq: Three times a day (TID) | INTRAMUSCULAR | Status: DC

## 2023-05-10 MED ORDER — SODIUM CHLORIDE 0.9% FLUSH: 3.0000 mL | INTRAVENOUS | Status: DC | PRN

## 2023-05-10 MED ORDER — ALBUTEROL SULFATE (2.5 MG/3ML) 0.083% IN NEBU
2.5000 mg | INHALATION_SOLUTION | Freq: Three times a day (TID) | RESPIRATORY_TRACT | Status: DC
  Administered 2023-05-10: 2.5 mg via RESPIRATORY_TRACT
  Filled 2023-05-10: qty 3

## 2023-05-10 MED ORDER — ACETAMINOPHEN 325 MG PO TABS: 650.0000 mg | ORAL_TABLET | Freq: Four times a day (QID) | ORAL | Status: DC | PRN

## 2023-05-10 MED ORDER — PANTOPRAZOLE SODIUM 40 MG PO TBEC
40.0000 mg | DELAYED_RELEASE_TABLET | Freq: Every day | ORAL | Status: DC
  Administered 2023-05-10 – 2023-05-11 (×2): 40 mg via ORAL
  Filled 2023-05-10 (×2): qty 1

## 2023-05-10 MED ORDER — INSULIN DETEMIR 100 UNIT/ML ~~LOC~~ SOLN
4.0000 [IU] | Freq: Two times a day (BID) | SUBCUTANEOUS | Status: DC
  Administered 2023-05-11: 4 [IU] via SUBCUTANEOUS
  Filled 2023-05-10 (×4): qty 0.04

## 2023-05-10 MED ORDER — ONDANSETRON HCL 4 MG PO TABS: 4.0000 mg | ORAL_TABLET | Freq: Four times a day (QID) | ORAL | Status: DC | PRN

## 2023-05-10 MED ORDER — APIXABAN 2.5 MG PO TABS
2.5000 mg | ORAL_TABLET | Freq: Two times a day (BID) | ORAL | Status: DC
  Administered 2023-05-11: 2.5 mg via ORAL
  Filled 2023-05-10 (×2): qty 1

## 2023-05-10 MED ORDER — ALBUTEROL SULFATE (2.5 MG/3ML) 0.083% IN NEBU
2.5000 mg | INHALATION_SOLUTION | Freq: Three times a day (TID) | RESPIRATORY_TRACT | Status: DC
  Administered 2023-05-11: 2.5 mg via RESPIRATORY_TRACT
  Filled 2023-05-10: qty 3

## 2023-05-10 MED ORDER — ATORVASTATIN CALCIUM 20 MG PO TABS
80.0000 mg | ORAL_TABLET | Freq: Every day | ORAL | Status: DC
  Administered 2023-05-10: 80 mg via ORAL
  Filled 2023-05-10: qty 4

## 2023-05-10 MED ORDER — IOHEXOL 350 MG/ML SOLN
75.0000 mL | Freq: Once | INTRAVENOUS | Status: AC | PRN
  Administered 2023-05-10: 75 mL via INTRAVENOUS

## 2023-05-10 MED ORDER — SEVELAMER CARBONATE 800 MG PO TABS
1600.0000 mg | ORAL_TABLET | Freq: Three times a day (TID) | ORAL | Status: DC
  Administered 2023-05-10 – 2023-05-11 (×2): 1600 mg via ORAL
  Filled 2023-05-10 (×4): qty 2

## 2023-05-10 MED ORDER — ESCITALOPRAM OXALATE 10 MG PO TABS
10.0000 mg | ORAL_TABLET | Freq: Every day | ORAL | Status: DC
  Administered 2023-05-10 – 2023-05-11 (×2): 10 mg via ORAL
  Filled 2023-05-10 (×2): qty 1

## 2023-05-10 MED ORDER — MIDODRINE HCL 5 MG PO TABS
5.0000 mg | ORAL_TABLET | ORAL | Status: DC
  Administered 2023-05-11: 5 mg via ORAL
  Filled 2023-05-10: qty 1

## 2023-05-10 MED ORDER — ASPIRIN 81 MG PO TBEC
81.0000 mg | DELAYED_RELEASE_TABLET | Freq: Every day | ORAL | Status: DC
  Administered 2023-05-10 – 2023-05-11 (×2): 81 mg via ORAL
  Filled 2023-05-10 (×2): qty 1

## 2023-05-10 MED ORDER — SODIUM CHLORIDE 0.9% FLUSH: 3.0000 mL | Freq: Two times a day (BID) | INTRAVENOUS | Status: DC

## 2023-05-10 NOTE — TOC Initial Note (Signed)
Transition of Care [facility]) - Initial/Assessment Note    Patient Details  Name: Deborah [name] MRN: [id number] Date of Birth: 30-Dec-1955  Transition of Care [facility]) CM/SW Contact:    Margarito Liner, LCSW Phone Number: 05/10/2023, 9:44 AM  Clinical Narrative: This patient was discharged yesterday from 2A. CSW called [employer] to see what the issue was. Driver delivered 10 L concentrator to the home yesterday and gave son all needed education. [employer] liaison said that son/patient still put her on the 5 L [employer] concentrator instead. CSW updated team. Patient was set up with [facility] for PT and OT and is active with [facility] Duty Nursing.                 Expected Discharge Plan: Home w Home Health Services Barriers to Discharge: Continued Medical Work up   Patient Goals and CMS Choice            Expected Discharge Plan and Services       Living arrangements for the past 2 months: Single Family Home                                      Prior Living Arrangements/Services Living arrangements for the past 2 months: Single Family Home Lives with:: Adult Children Patient language and need for interpreter reviewed:: Yes Do you feel safe going back to the place where you live?: Yes      Need for Family Participation in Patient Care: Yes (Comment) Care giver support system in place?: Yes (comment) Current home services: DME, Home OT, Home PT, Home RN Criminal Activity/Legal Involvement Pertinent to Current Situation/Hospitalization: No - Comment as needed  Activities of Daily Living      Permission Sought/Granted                  Emotional Assessment Appearance:: Appears stated age     Orientation: : Oriented to Self, Oriented to Place, Oriented to  Time, Oriented to Situation Alcohol / Substance Use: Not Applicable Psych Involvement: No (comment)  Admission diagnosis:  Low O2, EMS Patient Active Problem List   Diagnosis Date Noted   Diarrhea of  infectious origin 05/01/2023   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 05/01/2023   Pseudomonas respiratory infection 04/26/2023   Overweight (BMI 25.0-29.9) 04/24/2023   Acute on chronic diastolic (congestive) heart failure ([name]) 04/24/2023   Acute on chronic respiratory failure with hypoxia ([name]) 04/23/2023   Depression 04/18/2023   Dyslipidemia 04/18/2023   Essential hypertension 04/18/2023   Acute on chronic diastolic CHF (congestive heart failure) ([name]) 01/04/2022   Tracheostomy status ([name]) 11/01/2020   CAP (community acquired pneumonia) 11/01/2020   Diabetic neuropathy ([name]) 10/16/2017   Vocal cord dysfunction    Vocal cord paralysis    Chronic anticoagulation    Oropharyngeal dysphagia    Type 2 diabetes mellitus with chronic kidney disease, with long-term current use of insulin ([name])    Physical debility 10/11/2016   Debility    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, W, F    Anemia of chronic disease    Coronary artery disease involving native coronary artery of native heart without angina pectoris    Persistent cough    Dysphonia    Sleep disturbance    Diabetes mellitus type 2 in nonobese ([name])    Pressure injury of skin 09/18/2016   History of tracheostomy  History of MVR with cardiopulmonary bypass    PAF (paroxysmal atrial fibrillation) ([name])    S/P MVR (mitral valve replacement)    Mitral regurgitation 08/23/2016   Noncompliance 01/25/2015   PCP:  Si Gaul, MD Pharmacy:   [facility]  [address] [place] [address] Phone: [phone] Fax: [phone]  [facility] [address] [place] [address] Phone: [phone] Fax: [phone]  [facility] [address] [place] - [address] [place] [address] Phone: [phone] Fax: [phone]  [facility] [address], [place] - [address] [place] [address] Phone: [phone] Fax: [phone]     Social Determinants of Health (SDOH) Social History: SDOH Screenings   Food Insecurity: No Food Insecurity (04/24/2023)  Housing: Patient Declined (04/24/2023)  Transportation Needs: No Transportation Needs (04/24/2023)  Utilities: Not At Risk (04/24/2023)  Tobacco Use: Low Risk  (05/10/2023)   SDOH Interventions:     Readmission Risk Interventions    04/27/2023    4:18 PM 11/04/2020    9:20 AM  Readmission Risk Prevention Plan  Transportation Screening Complete Complete  PCP or Specialist Appt within 3-5 Days Complete   HRI or Home Care Consult Complete   Social Work Consult for Recovery Care Planning/Counseling Complete   Palliative Care Screening Not Applicable   Medication Review Oceanographer) Complete   PCP or Specialist appointment within 3-5 days of discharge  Complete  SW Recovery Care/Counseling Consult  Complete  Palliative Care Screening  Not Applicable  Skilled Nursing Facility  Not Applicable

## 2023-05-10 NOTE — ED Notes (Addendum)
Pt alert, NAD, calm, interactive, resps e/u, speaking clearly using finger to trach. Denies sx. Reports had HD yesterday. Access L upper arm. Xray into room, at [facility].

## 2023-05-10 NOTE — ED Notes (Signed)
Oxisensor switched to L ear.

## 2023-05-10 NOTE — ED Notes (Signed)
Resting comfortably. Familyat BS. VSS. Consent signed for HD.

## 2023-05-10 NOTE — ED Provider Notes (Signed)
[facility] Provider Note    Event Date/Time   First MD Initiated Contact with Patient 05/10/23 [phone]     (approximate)   History   Shortness of Breath   HPI  Deborah [name] is a 67 y.o. female extensive past medical history recent extensive hospitalization for pneumonia with trach collar in place on chronic 3 L nasal cannula just discharged yesterday from the hospital presents to the ER for reported shortness of breath.  Reportedly was desaturating down to the 70s on her trach collar.  EMS was able to get her back into the high 90s and 100% SpO2 on nonrebreather.  Patient denies any pain or discomfort.     Physical Exam   Triage Vital Signs: ED Triage Vitals  Enc Vitals Group     BP      Pulse      Resp      Temp      Temp src      SpO2      Weight      Height      Head Circumference      Peak Flow      Pain Score      Pain Loc      Pain Edu?      Excl. in GC?     Most recent vital signs: Vitals:   05/10/23 1400 05/10/23 1430  BP: 129/75 (!) 153/68  Pulse: 77   Resp: 14 (!) 24  Temp:    SpO2: 90% 96%     Constitutional: chronically ill appearing Mouth/Throat: Mucous membranes are moist.   Neck: Painless ROM. Trach collar without bleeding or stridor Cardiovascular:   Good peripheral circulation. Respiratory: Normal respiratory effort.  No retractions.  No wheezing.  Trach collar appears clear no stridor. Gastrointestinal: Soft and nontender.  Musculoskeletal:  no deformity   ED Results / Procedures / Treatments   Labs (all labs ordered are listed, but only abnormal results are displayed) Labs Reviewed  COMPREHENSIVE METABOLIC PANEL - Abnormal; Notable for the following components:      Result Value   Chloride 97 (*)    Glucose, Bld 140 (*)    BUN 34 (*)    Creatinine, Ser 6.57 (*)    Albumin 3.3 (*)    Total Bilirubin 1.4 (*)    GFR, Estimated 7 (*)    All other components within normal limits  CBC WITH  DIFFERENTIAL/PLATELET - Abnormal; Notable for the following components:   RBC 3.01 (*)    Hemoglobin 9.2 (*)    HCT 28.8 (*)    RDW 18.0 (*)    All other components within normal limits  BRAIN NATRIURETIC PEPTIDE - Abnormal; Notable for the following components:   B Natriuretic Peptide 2,541.2 (*)    All other components within normal limits  LACTIC ACID, PLASMA  LACTIC ACID, PLASMA  HEPATITIS B SURFACE ANTIGEN  HEPATITIS B SURFACE ANTIBODY, QUANTITATIVE     EKG  ED ECG REPORT I, Willy Eddy, the attending physician, personally viewed and interpreted this ECG.   Date: 05/10/2023  EKG Time: 9:50  Rate: 75  Rhythm: sinus  Axis: normal  Intervals: normal  ST&T Change: no stemi, no depressions    RADIOLOGY Please see ED Course for my review and interpretation.  I personally reviewed all radiographic images ordered to evaluate for the above acute complaints and reviewed radiology reports and findings.  These findings were personally discussed with the patient.  Please see  medical record for radiology report.    PROCEDURES:  Critical Care performed: No  .Critical Care  Performed by: Willy Eddy, MD Authorized by: Willy Eddy, MD   Critical care provider statement:    Critical care time (minutes):  34   Critical care was time spent personally by me on the following activities:  Ordering and performing treatments and interventions, ordering and review of laboratory studies, ordering and review of radiographic studies, pulse oximetry, re-evaluation of patient's condition, review of old charts, obtaining history from patient or surrogate, examination of patient, evaluation of patient's response to treatment, discussions with primary provider, discussions with consultants and development of treatment plan with patient or surrogate    MEDICATIONS ORDERED IN ED: Medications  sodium chloride flush (NS) 0.9 % injection 3 mL (has no administration in time range)   sodium chloride flush (NS) 0.9 % injection 3 mL (has no administration in time range)  0.9 %  sodium chloride infusion (has no administration in time range)  ondansetron (ZOFRAN) tablet 4 mg (has no administration in time range)    Or  ondansetron (ZOFRAN) injection 4 mg (has no administration in time range)  acetaminophen (TYLENOL) tablet 650 mg (has no administration in time range)  albuterol (PROVENTIL) (2.5 MG/3ML) 0.083% nebulizer solution 2.5 mg (has no administration in time range)  apixaban (ELIQUIS) tablet 2.5 mg (has no administration in time range)  aspirin EC tablet 81 mg (has no administration in time range)  atorvastatin (LIPITOR) tablet 80 mg (has no administration in time range)  carvedilol (COREG) tablet 6.25 mg (has no administration in time range)  pantoprazole (PROTONIX) EC tablet 40 mg (has no administration in time range)  irbesartan (AVAPRO) tablet 150 mg (has no administration in time range)  multivitamin (RENA-VIT) tablet 1 tablet (has no administration in time range)  sevelamer carbonate (RENVELA) tablet 1,600 mg (has no administration in time range)  midodrine (PROAMATINE) tablet 5 mg (has no administration in time range)  insulin detemir (LEVEMIR) injection 4 Units (has no administration in time range)  escitalopram (LEXAPRO) tablet 10 mg (has no administration in time range)  traZODone (DESYREL) tablet 50 mg (has no administration in time range)  gabapentin (NEURONTIN) capsule 300 mg (has no administration in time range)  iohexol (OMNIPAQUE) 350 MG/ML injection 75 mL (75 mLs Intravenous Contrast Given 05/10/23 1202)     IMPRESSION / MDM / ASSESSMENT AND PLAN / ED COURSE  I reviewed the triage vital signs and the nursing notes.                              Differential diagnosis includes, but is not limited to, Asthma, copd, CHF, pna, ptx, malignancy, Pe, anemia  Patient presenting to the ER for evaluation of symptoms as described above.  Based on symptoms,  risk factors and considered above differential, this presenting complaint could reflect a potentially life-threatening illness therefore the patient will be placed on continuous pulse oximetry and telemetry for monitoring.  Laboratory evaluation will be sent to evaluate for the above complaints.      Clinical Course as of 05/10/23 1455  Thu May 10, 2023  9528 Chest x-ray on my review and interpretation without any significant consolidation or edema. [PR]  1032 Further discussion with case managers patient was discharged on 4 to 6 L of trach collar with new oxygen concentrator sent home.  Son at bedside states that he was not aware of this and just  had her on 4.  Patient denies any pain.  She was turned to 5 L to see how she responds.  She is due for dialysis tomorrow.  Does not feel that she is having any worsening shortness of breath or discomfort at this time. [PR]  1043 Patient found to be 70% on 6 L.  Possible need for additional dialysis will consult nephrology.  Will consult hospitalist. [PR]    Clinical Course User Index [PR] Willy Eddy, MD     FINAL CLINICAL IMPRESSION(S) / ED DIAGNOSES   Final diagnoses:  Acute on chronic hypoxic respiratory failure ([name])     Rx / DC Orders   ED Discharge Orders     None        Note:  This document was prepared using Dragon voice recognition software and may include unintentional dictation errors.    Willy Eddy, MD 05/10/23 1455

## 2023-05-10 NOTE — Assessment & Plan Note (Signed)
No active CP  Cont home regimen   

## 2023-05-10 NOTE — Assessment & Plan Note (Signed)
BP stable Titrate home regimen 

## 2023-05-10 NOTE — ED Notes (Signed)
Pharmacy called to reschedule eliquis dose.

## 2023-05-10 NOTE — Assessment & Plan Note (Signed)
Cont lexapro 

## 2023-05-10 NOTE — Assessment & Plan Note (Addendum)
Decompensated respiratory failure with O2 requirements from 4 to 8 L with tracheostomy in place with baseline O2 demands around 3 L Noted recent admission for similar yesterday Chest ray negative for any acute infiltrate with noted recent admission for Pseudomonas and cardiovascular positive Suspect element of volume overload with no remarkable elevated BNP above baseline Pending hemodialysis CTA chest pending to rule out PE Trach appears relatively stable pending formal RT assessment Will otherwise monitor for now Consider formal pulmonology consult if symptoms persist

## 2023-05-10 NOTE — Assessment & Plan Note (Signed)
Nephrology consulted  Pending extra HD session x 1 in setting of ? Mild volume overload and decompensated chronic hypoxia

## 2023-05-10 NOTE — Assessment & Plan Note (Addendum)
Decompensated respiratory failure with O2 requirements from 4 to 8 L with tracheostomy in place with baseline O2 demands around 3 L Noted recent admission for similar yesterday Chest ray negative for any acute infiltrate (noted recent admission with Pseudomonas and corynebacterium positive sputum culture s/p completed abx 05/01/23)  Afebrile w/o leukocytosis  Suspect element of volume overload with noted elevated BNP above baseline Pending hemodialysis CTA chest pending to rule out PE Trach appears relatively stable pending formal RT assessment Will otherwise monitor resp status for now pending HD Consider formal pulmonology consult if symptoms persist

## 2023-05-10 NOTE — Assessment & Plan Note (Signed)
SSI

## 2023-05-10 NOTE — Progress Notes (Signed)
  Received patient in bed to unit.   Informed consent signed and in chart.    TX duration:3.25     Transported back to ED with transport and this Clinical research associate Hand-off given to patient's nurse. No c/o and no distress noted    Access used: LAVG Access issues: none   Total UF removed: 2.0L Medication(s) given: none Post HD VS: 132/61 Post HD weight: 62.4kg     Lynann Beaver  [facility]

## 2023-05-10 NOTE — ED Notes (Signed)
Ready for CT, then HD.

## 2023-05-10 NOTE — Assessment & Plan Note (Signed)
Continue with Eliquis 2.5 mg bid. 

## 2023-05-10 NOTE — ED Notes (Signed)
EDP increased O2 to 5L. Remains on venturi mask. Actively adjusting O2 and FiO2, and monitoring. SPO2 78-88. RT called to [facility] for adjustments. Pt resting comfortably, calm, NAD. No active coughing, or increased wob. Oxisensor switched to R hand (non-HD fistula arm), good wave form.

## 2023-05-10 NOTE — ED Notes (Signed)
RT at BS.

## 2023-05-10 NOTE — Progress Notes (Signed)
RT assisted with patient transport from ER to Dialysis with no complications. Pt remains on 40% venturi setup through trach collar.

## 2023-05-10 NOTE — Assessment & Plan Note (Signed)
Chronic trach in place. Appears stable  Reassess if decompensated resp status persists.

## 2023-05-10 NOTE — Assessment & Plan Note (Signed)
2D echo May 2024 with a EF of 50 to 55% and grade 2 diastolic dysfunction No decompensated respiratory status from 4 to 8 L nasal cannula with trach in place BNP 2500 today which is well above baseline Pending hemodialysis for volume removal Strict ins and outs and daily weights Reassess dry weight after hemodialysis 

## 2023-05-10 NOTE — ED Triage Notes (Signed)
Pt BIB EMS from home. Discharged yesterday after being in hospital for several weeks with pneumonia. Pt sats in low 80s with 4l on trac collar. Pt placed on NRB 15l by EMS and sats improved to 100%. Pt denies any pain or SOB. Pt alert and oriented.

## 2023-05-10 NOTE — Assessment & Plan Note (Signed)
Hgb 9.2 today which appears near baseline

## 2023-05-10 NOTE — ED Notes (Signed)
To HD with transport and RT

## 2023-05-10 NOTE — ED Notes (Addendum)
Back from CT, no changes, pending HD. HD notified. Transport re-initiated.

## 2023-05-10 NOTE — Assessment & Plan Note (Signed)
Cont  neurontin

## 2023-05-10 NOTE — Progress Notes (Signed)
[facility]  ROUNDING NOTE   Subjective:   [name] returns to [facility] on 05/10/2023 for Acute on chronic respiratory failure with hypoxia [facility]) [J96.21]  Patient is known to our practice and receives outpatient dialysis treatments at [facility] on a MWF schedule.  Patient was recently discharged yesterday but returns to emergency department with complaints of shortness of breath and low saturations.  According to patient, she awoke this morning with O2 saturations around 84 to 85%.  Her son, who was present at bedside, states she did wear her oxygen overnight.  Patient received dialysis treatment yesterday prior to discharge with 1.5 L of fluid removed.  Patient also recently admitted for CAP, completed all treatments.  Labs on ED arrival unremarkable for renal patient.  BNP elevated at 2500.  Chest x-ray appears improved from previous scans.  We have been consulted to provide dialysis.  Objective:  Vital signs in last 24 hours:  Temp:  [98.5 F (36.9 C)-99.5 F (37.5 C)] 98.5 F (36.9 C) (06/06 1323) Pulse Rate:  [62-87] 77 (06/06 1400) Resp:  [0-24] 24 (06/06 1430) BP: (119-156)/(44-84) 153/68 (06/06 1430) SpO2:  [75 %-100 %] 96 % (06/06 1430) FiO2 (%):  [30 %-100 %] 40 % (06/06 1051) Weight:  [63.7 kg-64.5 kg] 64.5 kg (06/06 1323)  Weight change:  Filed Weights   05/10/23 0909 05/10/23 1323  Weight: 63.7 kg 64.5 kg    Intake/Output: No intake/output data recorded.   Intake/Output this shift:  No intake/output data recorded.  Physical Exam: General: NAD  Head: Normocephalic, atraumatic. Moist oral mucosal membranes  Eyes: Anicteric  Neck: +tracheostomy  Lungs:  Diminished in bases  bilaterally  Heart: Regular rate and rhythm  Abdomen:  Soft, nontender  Extremities: No peripheral edema.  Neurologic: Nonfocal, moving all four extremities  Skin: No lesions  Access: Left AVG    Basic Metabolic Panel: Recent Labs  Lab 05/04/23 1626  05/09/23 0736 05/10/23 0943  NA 134* 135 136  K 4.6 4.0 4.0  CL 95* 97* 97*  CO2 27 28 27   GLUCOSE 237* 157* 140*  BUN 42* 44* 34*  CREATININE 8.69* 8.59* 6.57*  CALCIUM 8.7* 8.9 9.6  PHOS 5.2* 5.6*  --      Liver Function Tests: Recent Labs  Lab 05/04/23 1626 05/09/23 0736 05/10/23 0943  AST  --   --  20  ALT  --   --  20  ALKPHOS  --   --  70  BILITOT  --   --  1.4*  PROT  --   --  7.3  ALBUMIN 3.0* 3.0* 3.3*    No results for input(s): "LIPASE", "AMYLASE" in the last 168 hours. No results for input(s): "AMMONIA" in the last 168 hours.  CBC: Recent Labs  Lab 05/04/23 1626 05/09/23 0736 05/10/23 0943  WBC 9.1 7.1 7.5  NEUTROABS  --   --  5.0  HGB 7.8* 8.3* 9.2*  HCT 24.4* 25.7* 28.8*  MCV 95.3 93.5 95.7  PLT 316 338 330     Cardiac Enzymes: No results for input(s): "CKTOTAL", "CKMB", "CKMBINDEX", "TROPONINI" in the last 168 hours.  BNP: Invalid input(s): "POCBNP"  CBG: Recent Labs  Lab 05/08/23 1316 05/08/23 1738 05/08/23 2137 05/09/23 0835 05/09/23 1542  GLUCAP 235* 172* 133* 148* 138*     Microbiology: Results for orders placed or performed during the hospital encounter of 04/23/23  Organism ID, Bacteria     Status: None (Preliminary result)   Collection Time:  04/18/23  8:20 PM  Result Value Ref Range Status   Organism ID, Bacteria REFERT  Final    Comment: (NOTE) Please refer to the following specimen for additional lab results. Please refer to spec # [id number] for test result. Camelia Moraru notified 05-02-2023 at 13:00 Performed At: [facility] [address] [place], [place] [id number] Jolene Schimke MD [id number]    Source of Sample PENDING  Incomplete  Bacterial organism reflex     Status: None   Collection Time: 04/18/23  8:20 PM  Result Value Ref Range Status   Bacterial result 1 Comment  Final    Comment: (NOTE) Specimen has been received and testing has been initiated. Performed At: [facility] [address] [place], [place] [id number] Jolene Schimke MD [id number]:[id number]   Organism ID by MALDI     Status: None (Preliminary result)   Collection Time: 04/18/23  8:20 PM  Result Value Ref Range Status   Organism ID by MALDI Comment  Final    Comment: (NOTE) Specimen has been received. Testing has been initiated. Performed At: [facility] [address] [place], [place] [id number] Jolene Schimke MD [id number]:[id number]    Source (MTB RIF) PENDING  Incomplete  Aerobic ID by MALDI     Status: None   Collection Time: 04/18/23  8:20 PM  Result Value Ref Range Status   Aerobic ID by MALDI Comment  Final    Comment: (NOTE) Unable to identify by MALDI. See Organism ID by Sequencing. Performed At: [facility] [address] [place], [place] [id number] Jolene Schimke MD [id number]   SARS Coronavirus 2 by RT PCR (hospital order, performed in [facility] hospital lab) *cepheid single result test* Anterior Nasal Swab     Status: None   Collection Time: 04/23/23  9:30 AM   Specimen: Anterior Nasal Swab  Result Value Ref Range Status   SARS Coronavirus 2 by RT PCR NEGATIVE NEGATIVE Final    Comment: (NOTE) SARS-CoV-2 target nucleic acids are NOT DETECTED.  The SARS-CoV-2 RNA is generally detectable in upper and lower respiratory specimens during the acute phase of infection. The lowest concentration of SARS-CoV-2 viral copies this assay can detect is 250 copies / mL. A negative result does not preclude SARS-CoV-2 infection and should not be used as the sole basis for treatment or other patient management decisions.  A negative result may occur with improper specimen collection / handling, submission of specimen other than nasopharyngeal swab, presence of viral mutation(s) within the areas targeted by this assay, and inadequate number of viral copies (<250 copies / mL). A negative result must be combined with clinical observations, patient history, and  epidemiological information.  Fact Sheet for Patients:   RoadLapTop.co.za  Fact Sheet for Healthcare Providers: http://kim-miller.com/  This test is not yet approved or  cleared by the [place] FDA and has been authorized for detection and/or diagnosis of SARS-CoV-2 by FDA under an Emergency Use Authorization (EUA).  This EUA will remain in effect (meaning this test can be used) for the duration of the COVID-19 declaration under Section 564(b)(1) of the Act, 21 U.S.C. section 360bbb-3(b)(1), unless the authorization is terminated or revoked sooner.  Performed at [facility], [address] [place], [place] [address]   Culture, blood (Routine X 2) w Reflex to ID Panel     Status: None   Collection Time: 04/24/23  7:52 PM   Specimen: BLOOD  Result Value Ref Range Status   Specimen Description BLOOD RIGHT ANTECUBITAL  Final   Special Requests   Final    BOTTLES DRAWN AEROBIC AND ANAEROBIC Blood Culture adequate volume   Culture   Final    NO GROWTH 5 DAYS Performed at [facility], [address] [place], [place] [address]    Report Status 04/29/2023 FINAL  Final  Culture, blood (Routine X 2) w Reflex to ID Panel     Status: None   Collection Time: 04/24/23  7:56 PM   Specimen: BLOOD  Result Value Ref Range Status   Specimen Description BLOOD RIGHT ANTECUBITAL  Final   Special Requests   Final    BOTTLES DRAWN AEROBIC AND ANAEROBIC Blood Culture adequate volume   Culture   Final    NO GROWTH 5 DAYS Performed at [facility], [address] [place], [place] [address]    Report Status 04/29/2023 FINAL  Final  Respiratory (~20 pathogens) panel by PCR     Status: None   Collection Time: 04/25/23  2:01 PM   Specimen: Nasopharyngeal Swab; Respiratory  Result Value Ref Range Status   Adenovirus NOT DETECTED NOT DETECTED Final   Coronavirus 229E NOT DETECTED NOT DETECTED Final    Comment:  (NOTE) The Coronavirus on the Respiratory Panel, DOES NOT test for the novel  Coronavirus (2019 nCoV)    Coronavirus HKU1 NOT DETECTED NOT DETECTED Final   Coronavirus NL63 NOT DETECTED NOT DETECTED Final   Coronavirus OC43 NOT DETECTED NOT DETECTED Final   Metapneumovirus NOT DETECTED NOT DETECTED Final   Rhinovirus / Enterovirus NOT DETECTED NOT DETECTED Final   Influenza A NOT DETECTED NOT DETECTED Final   Influenza B NOT DETECTED NOT DETECTED Final   Parainfluenza Virus 1 NOT DETECTED NOT DETECTED Final   Parainfluenza Virus 2 NOT DETECTED NOT DETECTED Final   Parainfluenza Virus 3 NOT DETECTED NOT DETECTED Final   Parainfluenza Virus 4 NOT DETECTED NOT DETECTED Final   Respiratory Syncytial Virus NOT DETECTED NOT DETECTED Final   Bordetella pertussis NOT DETECTED NOT DETECTED Final   Bordetella Parapertussis NOT DETECTED NOT DETECTED Final   Chlamydophila pneumoniae NOT DETECTED NOT DETECTED Final   Mycoplasma pneumoniae NOT DETECTED NOT DETECTED Final    Comment: Performed at [facility], [address], [place], [place] [address]  Gastrointestinal Panel by PCR , Stool     Status: Abnormal   Collection Time: 04/26/23  6:56 AM   Specimen: Stool  Result Value Ref Range Status   Campylobacter species NOT DETECTED NOT DETECTED Final   Plesimonas shigelloides NOT DETECTED NOT DETECTED Final   Salmonella species NOT DETECTED NOT DETECTED Final   Yersinia enterocolitica NOT DETECTED NOT DETECTED Final   Vibrio species DETECTED (A) NOT DETECTED Final    Comment: RESULT CALLED TO, READ BACK BY AND VERIFIED WITH: JOSEPH HAYES AT 9147 04/29/23.PMF    Vibrio cholerae DETECTED (A) NOT DETECTED Final    Comment: RESULT CALLED TO, READ BACK BY AND VERIFIED WITH: JOSEPH HAYES AT 8295 04/29/23.PMF    Enteroaggregative E coli (EAEC) NOT DETECTED NOT DETECTED Final   Enteropathogenic E coli (EPEC) NOT DETECTED NOT DETECTED Final   Enterotoxigenic E coli (ETEC) NOT DETECTED NOT DETECTED  Final   Shiga like toxin producing E coli (STEC) NOT DETECTED NOT DETECTED Final   Shigella/Enteroinvasive E coli (EIEC) NOT DETECTED NOT DETECTED Final   Cryptosporidium NOT DETECTED NOT DETECTED Final   Cyclospora cayetanensis NOT DETECTED NOT DETECTED Final   Entamoeba histolytica NOT DETECTED NOT DETECTED Final  Giardia lamblia NOT DETECTED NOT DETECTED Final   Adenovirus F40/41 NOT DETECTED NOT DETECTED Final   Astrovirus NOT DETECTED NOT DETECTED Final   Norovirus GI/GII NOT DETECTED NOT DETECTED Final   Rotavirus A NOT DETECTED NOT DETECTED Final   Sapovirus (I, II, IV, and V) NOT DETECTED NOT DETECTED Final    Comment: Performed at [facility], [address] [place], [place] [address]  Stool culture     Status: None   Collection Time: 05/01/23  8:51 PM   Specimen: Stool  Result Value Ref Range Status   Salmonella/Shigella Screen Final report  Final   Campylobacter Culture Final report  Final   E coli, Shiga toxin Assay Negative Negative Final    Comment: (NOTE) Performed At: [facility] [address] [place], [place] [id number] Jolene Schimke MD [id number]   STOOL CULTURE REFLEX - RSASHR     Status: None   Collection Time: 05/01/23  8:51 PM  Result Value Ref Range Status   Stool Culture result 1 (RSASHR) Comment  Final    Comment: (NOTE) No Salmonella or Shigella recovered. Performed At: [facility] [address] [place], [place] [id number] Jolene Schimke MD [id number]   STOOL CULTURE Reflex - CMPCXR     Status: None   Collection Time: 05/01/23  8:51 PM  Result Value Ref Range Status   Stool Culture result 1 (CMPCXR) Comment  Final    Comment: (NOTE) No Campylobacter species isolated. Performed At: [facility] [address] [place], [place] [id number] Jolene Schimke MD [id number]     Coagulation Studies: No results for input(s): "LABPROT", "INR" in the last 72 hours.   Urinalysis: No results for input(s):  "COLORURINE", "LABSPEC", "PHURINE", "GLUCOSEU", "HGBUR", "BILIRUBINUR", "KETONESUR", "PROTEINUR", "UROBILINOGEN", "NITRITE", "LEUKOCYTESUR" in the last 72 hours.  Invalid input(s): "APPERANCEUR"    Imaging: DG Chest Port 1 View  Result Date: 05/10/2023 CLINICAL DATA:  Sepsis EXAM: PORTABLE CHEST 1 VIEW COMPARISON:  X-ray 05/06/2023 FINDINGS: Enlarged cardiopericardial silhouette. Sternal wires. Prosthetic aortic valve. Tracheostomy tube. Left axillary vascular stent. Stable vascular congestion and mild edema. Persistent mild superimposed additional left lung base opacity with tiny effusion. No pneumothorax. Overlapping cardiac leads. IMPRESSION: No significant interval change when adjusting for technique Electronically Signed   By: Karen Kays M.D.   On: 05/10/2023 09:52     Medications:    sodium chloride       albuterol  2.5 mg Nebulization TID PC & HS   apixaban  2.5 mg Oral BID   aspirin EC  81 mg Oral Daily   atorvastatin  80 mg Oral q1800   carvedilol  6.25 mg Oral BID WC   escitalopram  10 mg Oral Daily   gabapentin  300 mg Oral QHS   insulin detemir  4 Units Subcutaneous BID   irbesartan  150 mg Oral Daily   [START ON 05/11/2023] midodrine  5 mg Oral Once per day on Mon Wed Fri   multivitamin  1 tablet Oral Daily   pantoprazole  40 mg Oral Daily   sevelamer carbonate  1,600 mg Oral TID WC   sodium chloride flush  3 mL Intravenous Q12H   [START ON 05/11/2023] traZODone  50 mg Oral Once per day on Mon Wed Fri   sodium chloride, acetaminophen, ondansetron **OR** ondansetron (ZOFRAN) IV, sodium chloride flush  Assessment/ Plan:  [name] is a 67 y.o.  female with end stage renal disease on hemodialysis, tracheostomy, hypertension, hyperlipidemia, diabetes mellitus type  II insulin dependent, depression, and diabetic neuropathy who presents to [facility] for 05/10/2023 for Acute on chronic respiratory failure with hypoxia ([name]) [J96.21]  [facility] MWF [address]  Left AVG 62kg  Acute respiratory failure with end-stage renal disease on hemodialysis.  Last treatment received prior to discharge yesterday with 1.5 L of fluid removal.  Will provide additional dialysis treatment today with UF only treatment, 2 L goal.  Patient currently satting on 8 L trach collar, baseline of 2 to 4 L.  Hypertension with chronic kidney disease: current regimen of irbesartan, carvedilol and amlodipine. Home regimen of olmesartan instead of irbesartan.  Midodrine available for dialysis.  Diabetes mellitus type II with chronic kidney disease: insulin dependent.  - Primary team to manage sliding scale insulin.  Secondary Hyperparathyroidism -Currently prescribed sevelamer with meals outpatient.  Anemia of chronic kidney disease: -Hemoglobin within desired range.  No need for ESA's at this time.  LOS: 0   6/6/20242:56 PM

## 2023-05-10 NOTE — ED Notes (Signed)
Pt suctioned, congested cough. RT notified, wqill see. Family at  [facility].

## 2023-05-10 NOTE — ED Notes (Signed)
HD delayed d/t CTA order, pending CT.

## 2023-05-10 NOTE — Assessment & Plan Note (Signed)
Cont BB and eliquis

## 2023-05-10 NOTE — Assessment & Plan Note (Signed)
2D echo May 2024 with a EF of 50 to 55% and grade 2 diastolic dysfunction No decompensated respiratory status from 4 to 8 L nasal cannula with trach in place BNP 2500 today which is well above baseline Pending hemodialysis for volume removal Strict ins and outs and daily weights Reassess dry weight after hemodialysis

## 2023-05-10 NOTE — H&P (Addendum)
History and Physical    Patient: Deborah [name]:[id number] DOB: 1956-03-12 DOA: 05/10/2023 DOS: the patient was seen and examined on 05/10/2023 PCP: Si Gaul, MD  Patient coming from: Home  Chief Complaint:  Chief Complaint  Patient presents with   Shortness of Breath   HPI: Deborah [name] is a 67 y.o. female with medical history significant of end-stage renal disease on dialysis Monday Wednesday Friday, paroxysmal atrial fibrillation on Eliquis, history of tracheostomy, chronic diastolic heart failure, chronic hypoxia at night, uncontrolled type 2 diabetes, hypertension presenting with acute on chronic respiratory failure with hypoxia.  Patient noted to have been admitted May 28 through June 5 for similar issue of acute on chronic respiratory failure with hypoxia with noted community-acquired pneumonia which grew out Pseudomonas and Corynebacterium and completed antibiotics on May 26, diastolic CHF status post hemodialysis, as well as vibrio diarrhea status post azithromycin.  Patient reports worsening shortness of breath since she left the hospital.  Baseline 3-4 L through her trach on a regular basis.  Has had increased use beyond 4 L.  Denies any fevers or chills.  No chest pain or abdominal pain.  Does report eating some salty foods.  Makes a trace amount of urine.  No lower extremity swelling.  Minimal orthopnea and PND.  Has been compliant with discharge regimen regimen.  No tobacco use.  No reported trach irritation though she does report having some episodes of mild blood streaked mucus with cleaning at times. Presented to the ER afebrile, hemodynamically stable, requiring 8 L at 40% through the tracheostomy collar to keep O2 sats greater than 97%.  White count 7.5, hemoglobin 9.2, lactate 1.2, creatinine 6.6, BNP of 2541 with baseline BNP around 800.  Chest x-ray grossly stable. Review of Systems: As mentioned in the history of present illness. All other systems reviewed and are  negative. Past Medical History:  Diagnosis Date   Acute on chronic diastolic CHF (congestive heart failure) ([name]) 01/04/2022   Atrial fibrillation ([name])    Atrial flutter ([name])    Cardiogenic shock ([name]) 08/17/2016   Diabetes mellitus    Diabetes mellitus type 2, controlled ([name]) 03/08/2012   ESRD (end stage renal disease) ([name])    Heart attack ([name])    Hyperlipidemia    Hypertension    MI, old    NSTEMI (non-ST elevated myocardial infarction) ([name])    Renal disorder    dialysls   Respiratory failure ([name]) 08/17/2016   ST elevation myocardial infarction involving left main coronary artery ([name])    STEMI (ST elevation myocardial infarction) ([name]) 08/15/2016   Type 2 diabetes mellitus with complication, with long-term current use of insulin [facility])    Past Surgical History:  Procedure Laterality Date   A/V FISTULAGRAM N/A 10/05/2017   Procedure: A/V Fistulagram;  Surgeon: Renford Dills, MD;  Location: [facility] INVASIVE CV LAB;  Service: Cardiovascular;  Laterality: N/A;   A/V SHUNTOGRAM N/A 10/05/2017   Procedure: A/V SHUNTOGRAM;  Surgeon: Renford Dills, MD;  Location: [facility] INVASIVE CV LAB;  Service: Cardiovascular;  Laterality: N/A;   AV FISTULA PLACEMENT Left 10/02/2016   Procedure: INSERTION OF ARTERIOVENOUS (AV) GORE-TEX GRAFT ARM;  Surgeon: Maeola Harman, MD;  Location: [facility] OR;  Service: Vascular;  Laterality: Left;   CARDIAC CATHETERIZATION N/A 08/15/2016   Procedure: Left Heart Cath and Coronary Angiography;  Surgeon: Alwyn Pea, MD;  Location: [facility] INVASIVE CV LAB;  Service: Cardiovascular;  Laterality: N/A;   CARDIAC CATHETERIZATION N/A 08/15/2016  Procedure: Coronary Stent Intervention;  Surgeon: Alwyn Pea, MD;  Location: [facility] INVASIVE CV LAB;  Service: Cardiovascular;  Laterality: N/A;   CARDIAC CATHETERIZATION N/A 08/18/2016   Procedure: Right Heart Cath;  Surgeon: Dolores Patty, MD;  Location: [facility] INVASIVE CV LAB;  Service: Cardiovascular;   Laterality: N/A;   CARDIAC CATHETERIZATION N/A 08/18/2016   Procedure: IABP Insertion;  Surgeon: Dolores Patty, MD;  Location: [facility] INVASIVE CV LAB;  Service: Cardiovascular;  Laterality: N/A;   CARDIAC CATHETERIZATION Right 08/23/2016   Procedure: CENTRAL LINE INSERTION RIGHT SUBCLAVIAN;  Surgeon: Kerin Perna, MD;  Location: [facility] OR;  Service: Open Heart Surgery;  Laterality: Right;   ENDOVEIN HARVEST OF GREATER SAPHENOUS VEIN Left 08/23/2016   Procedure: ENDOVEIN HARVEST OF GREATER SAPHENOUS VEIN;  Surgeon: Kerin Perna, MD;  Location: [facility] OR;  Service: Open Heart Surgery;  Laterality: Left;   INSERTION OF DIALYSIS CATHETER Bilateral 08/31/2016   Procedure: INSERTION OF DIALYSIS CATHETER LEFT INTERNAL JUGULAR VEIN & INSERTION OF TRIPLE LUMEN RIGHT INTERNAL JUGULAR VEIN;  Surgeon: Chuck Hint, MD;  Location: [facility] OR;  Service: Vascular;  Laterality: Bilateral;   INTRAOPERATIVE TRANSESOPHAGEAL ECHOCARDIOGRAM N/A 08/23/2016   Procedure: INTRAOPERATIVE TRANSESOPHAGEAL ECHOCARDIOGRAM;  Surgeon: Kerin Perna, MD;  Location: [facility] OR;  Service: Open Heart Surgery;  Laterality: N/A;   IR GENERIC HISTORICAL  11/13/2016   IR US GUIDE VASC ACCESS LEFT 11/13/2016 Gilmer Mor, DO [facility]   IR GENERIC HISTORICAL  11/13/2016   IR FLUORO GUIDE CV LINE LEFT 11/13/2016 Gilmer Mor, DO [facility]   IR GENERIC HISTORICAL  11/13/2016   IR GASTROSTOMY TUBE MOD SED 11/13/2016 Gilmer Mor, DO [facility]   MITRAL VALVE REPAIR N/A 08/23/2016   Procedure: MITRAL VALVE REPAIR (MVR) USING EDWARDS MAGNA EASE BIOPROSTHESIS MITRAL  VALVE;  Surgeon: Kerin Perna, MD;  Location: [facility] OR;  Service: Open Heart Surgery;  Laterality: N/A;   tracheostomy reversal     TRACHEOSTOMY TUBE PLACEMENT N/A 11/09/2016   Procedure: TRACHEOSTOMY;  Surgeon: Suzanna Obey, MD;  Location: [facility] OR;  Service: ENT;  Laterality: N/A;   VAGINAL DELIVERY     x 6   Social History:  reports that she has never smoked. She has  never used smokeless tobacco. She reports that she does not drink alcohol and does not use drugs.  No Known Allergies  Family History  Problem Relation Age of Onset   Hypertension Mother    Diabetes Mother    Breast cancer Sister    Hypertension Father     Prior to Admission medications   Medication Sig Start Date End Date Taking? Authorizing Provider  atorvastatin (LIPITOR) 80 MG tablet Take 80 mg by mouth daily at 6 PM.    Yes [provider]  carvedilol (COREG) 6.25 MG tablet Take 1 tablet (6.25 mg total) by mouth 2 (two) times daily with a meal. 05/09/23  Yes Wieting, Richard, MD  gabapentin (NEURONTIN) 300 MG capsule Take 1 capsule (300 mg total) by mouth at bedtime. 04/04/20  Yes Standley Brooking, MD  traZODone (DESYREL) 50 MG tablet Take 50 mg by mouth 3 (three) times a week. Take on Dialysis days 04/04/18  Yes [provider]  acetaminophen (TYLENOL) 325 MG tablet Take 650 mg by mouth every 6 (six) hours as needed for mild pain or fever.  04/06/17   [provider]  albuterol (PROVENTIL) (2.5 MG/3ML) 0.083% nebulizer solution Take 3 mLs (2.5 mg total) by nebulization 4 (four) times  daily - after meals and at bedtime. 11/01/16   Love, Evlyn Kanner, PA-C  apixaban (ELIQUIS) 2.5 MG TABS tablet Take 2.5 mg by mouth 2 (two) times daily.  06/22/17   [provider]  aspirin 81 MG EC tablet Take 1 tablet (81 mg total) by mouth daily. 10/24/19   Swayze, Ava, DO  escitalopram (LEXAPRO) 10 MG tablet Take 10 mg by mouth daily. 09/13/20   [provider]  guaiFENesin-dextromethorphan (ROBITUSSIN DM) 100-10 MG/5ML syrup Take 5 mLs by mouth every 4 (four) hours as needed for cough. 04/22/23   Arnetha Courser, MD  LEVEMIR FLEXTOUCH 100 UNIT/ML FlexPen Inject 4 Units into the skin 2 (two) times daily. 03/05/20   [provider]  Lidocaine, Anorectal, 5 % CREA Sparingly Topical As Directed 04/03/23   [provider]  lidocaine-prilocaine (EMLA) cream  Apply 1 application topically every dialysis. 09/26/19   [provider]  midodrine (PROAMATINE) 5 MG tablet Take 5 mg by mouth 3 (three) times a week.    [provider]  multivitamin (RENA-VIT) TABS tablet Take 1 tablet by mouth daily. 01/17/23   [provider]  olmesartan (BENICAR) 20 MG tablet Take 20 mg by mouth daily. 03/12/20   [provider]  pantoprazole (PROTONIX) 40 MG tablet Take 1 tablet (40 mg total) by mouth daily. 05/09/23   Alford Highland, MD  sevelamer carbonate (RENVELA) 800 MG tablet Take 1,600 mg by mouth 3 (three) times daily with meals.    [provider]    Physical Exam: Vitals:   05/10/23 1200 05/10/23 1230 05/10/23 1245 05/10/23 1250  BP:      Pulse: 73 62 71 71  Resp: 11 18 (!) 24 12  Temp:      TempSrc:      SpO2: 100% 98% 95% 95%  Weight:       Physical Exam Constitutional:      Appearance: She is normal weight.  HENT:     Head: Normocephalic.     Comments: Tracheostomy appears stable    Nose: Nose normal.     Mouth/Throat:     Mouth: Mucous membranes are moist.  Eyes:     Pupils: Pupils are equal, round, and reactive to light.  Cardiovascular:     Rate and Rhythm: Normal rate and regular rhythm.  Pulmonary:     Effort: Pulmonary effort is normal.     Breath sounds: No wheezing or rales.  Abdominal:     General: Bowel sounds are normal.  Musculoskeletal:        General: Normal range of motion.  Skin:    General: Skin is warm.  Neurological:     General: No focal deficit present.  Psychiatric:        Mood and Affect: Mood normal.     Data Reviewed:  There are no new results to review at this time. DG Chest Port 1 View CLINICAL DATA:  Sepsis  EXAM: PORTABLE CHEST 1 VIEW  COMPARISON:  X-ray 05/06/2023  FINDINGS: Enlarged cardiopericardial silhouette. Sternal wires. Prosthetic aortic valve. Tracheostomy tube. Left axillary vascular stent. Stable vascular congestion and mild edema.  Persistent mild superimposed additional left lung base opacity with tiny effusion. No pneumothorax. Overlapping cardiac leads.  IMPRESSION: No significant interval change when adjusting for technique  Electronically Signed   By: Karen Kays M.D.   On: 05/10/2023 09:52    Lab Results  Component Value Date   WBC 7.5 05/10/2023   HGB 9.2 (L) 05/10/2023  HCT 28.8 (L) 05/10/2023   MCV 95.7 05/10/2023   PLT 330 05/10/2023   Last metabolic panel Lab Results  Component Value Date   GLUCOSE 140 (H) 05/10/2023   NA 136 05/10/2023   K 4.0 05/10/2023   CL 97 (L) 05/10/2023   CO2 27 05/10/2023   BUN 34 (H) 05/10/2023   CREATININE 6.57 (H) 05/10/2023   GFRNONAA 7 (L) 05/10/2023   CALCIUM 9.6 05/10/2023   PHOS 5.6 (H) 05/09/2023   PROT 7.3 05/10/2023   ALBUMIN 3.3 (L) 05/10/2023   BILITOT 1.4 (H) 05/10/2023   ALKPHOS 70 05/10/2023   AST 20 05/10/2023   ALT 20 05/10/2023   ANIONGAP 12 05/10/2023    Assessment and Plan: * Acute on chronic respiratory failure with hypoxia ([name]) Decompensated respiratory failure with O2 requirements from 4 to 8 L with tracheostomy in place with baseline O2 demands around 3 L Noted recent admission for similar yesterday Chest ray negative for any acute infiltrate (noted recent admission with Pseudomonas and corynebacterium positive sputum culture s/p completed abx 05/01/23)  Afebrile w/o leukocytosis  Suspect element of volume overload with noted elevated BNP above baseline Pending hemodialysis CTA chest pending to rule out PE Trach appears relatively stable pending formal RT assessment Will otherwise monitor resp status for now pending HD Consider formal pulmonology consult if symptoms persist  Essential hypertension BP stable  Titrate home regimen    Tracheostomy status ([name]) Chronic trach in place. Appears stable  Reassess if decompensated resp status persists.    Chronic anticoagulation Continue with Eliquis 2.5 mg bid.   ESRD on  dialysis [facility]) - M, W, F Nephrology consulted  Pending extra HD session x 1 in setting of ? Mild volume overload and decompensated chronic hypoxia    PAF (paroxysmal atrial fibrillation) ([name]) Cont BB and eliquis    Acute on chronic diastolic (congestive) heart failure ([name]) 2D echo May 2024 with a EF of 50 to 55% and grade 2 diastolic dysfunction No decompensated respiratory status from 4 to 8 L nasal cannula with trach in place BNP 2500 today which is well above baseline Pending hemodialysis for volume removal Strict ins and outs and daily weights Reassess dry weight after hemodialysis  Depression Cont lexapro  Diabetic neuropathy ([name]) Cont neurontin    Coronary artery disease involving native coronary artery of native heart without angina pectoris No active CP  Cont home regimen    Anemia of chronic disease Hgb 9.2 today which appears near baseline    Diabetes mellitus type 2 in nonobese [facility]) SSI      Advance Care Planning:   Code Status: Full Code   Consults: Nephrology   Family Communication: Family at the bedside    Severity of Illness: The appropriate patient status for this patient is OBSERVATION. Observation status is judged to be reasonable and necessary in order to provide the required intensity of service to ensure the patient's safety. The patient's presenting symptoms, physical exam findings, and initial radiographic and laboratory data in the context of their medical condition is felt to place them at decreased risk for further clinical deterioration. Furthermore, it is anticipated that the patient will be medically stable for discharge from the hospital within 2 midnights of admission.    Greater than 50% was spent in counseling and coordination of care with patient Total encounter time 80 minutes or more  Author: Floydene Flock, MD 05/10/2023 12:58 PM  For on call review [email or web address].

## 2023-05-11 DIAGNOSIS — I272 Pulmonary hypertension, unspecified: Secondary | ICD-10-CM | POA: Insufficient documentation

## 2023-05-11 DIAGNOSIS — J9621 Acute and chronic respiratory failure with hypoxia: Secondary | ICD-10-CM | POA: Diagnosis not present

## 2023-05-11 LAB — CBC
HCT: 25.4 % — ABNORMAL LOW (ref 36.0–46.0)
Hemoglobin: 8.1 g/dL — ABNORMAL LOW (ref 12.0–15.0)
MCH: 30.6 pg (ref 26.0–34.0)
MCHC: 31.9 g/dL (ref 30.0–36.0)
MCV: 95.8 fL (ref 80.0–100.0)
Platelets: 310 10*3/uL (ref 150–400)
RBC: 2.65 MIL/uL — ABNORMAL LOW (ref 3.87–5.11)
RDW: 17.4 % — ABNORMAL HIGH (ref 11.5–15.5)
WBC: 7.6 10*3/uL (ref 4.0–10.5)
nRBC: 0 % (ref 0.0–0.2)

## 2023-05-11 LAB — COMPREHENSIVE METABOLIC PANEL
ALT: 18 U/L (ref 0–44)
AST: 20 U/L (ref 15–41)
Albumin: 2.8 g/dL — ABNORMAL LOW (ref 3.5–5.0)
Alkaline Phosphatase: 60 U/L (ref 38–126)
Anion gap: 10 (ref 5–15)
BUN: 47 mg/dL — ABNORMAL HIGH (ref 8–23)
CO2: 23 mmol/L (ref 22–32)
Calcium: 8.7 mg/dL — ABNORMAL LOW (ref 8.9–10.3)
Chloride: 106 mmol/L (ref 98–111)
Creatinine, Ser: 8.07 mg/dL — ABNORMAL HIGH (ref 0.44–1.00)
GFR, Estimated: 5 mL/min — ABNORMAL LOW (ref >60)
Glucose, Bld: 227 mg/dL — ABNORMAL HIGH (ref 70–99)
Potassium: 4.2 mmol/L (ref 3.5–5.1)
Sodium: 139 mmol/L (ref 135–145)
Total Bilirubin: 1 mg/dL (ref 0.3–1.2)
Total Protein: 6.1 g/dL — ABNORMAL LOW (ref 6.5–8.1)

## 2023-05-11 LAB — HEPATITIS B SURFACE ANTIGEN: Hepatitis B Surface Ag: NONREACTIVE

## 2023-05-11 LAB — HEPATITIS B SURFACE ANTIBODY, QUANTITATIVE: Hep B S AB Quant (Post): 2844 m[IU]/mL (ref >9.9)

## 2023-05-11 LAB — CBG MONITORING, ED: Glucose-Capillary: 256 mg/dL — ABNORMAL HIGH (ref 70–99)

## 2023-05-11 MED ORDER — NEPRO/CARBSTEADY PO LIQD: 237.0000 mL | Freq: Three times a day (TID) | ORAL | Status: DC

## 2023-05-11 MED ORDER — PENTAFLUOROPROP-TETRAFLUOROETH EX AERO
INHALATION_SPRAY | CUTANEOUS | Status: AC
  Filled 2023-05-11: qty 30

## 2023-05-11 NOTE — Progress Notes (Signed)
Hemodialysis note  Received patient in bed to unit. Alert and oriented.  Informed consent signed and in chart.  Treatment initiated: 1228 Treatment completed: 1600  Patient tolerated well. Transported back to room, alert without acute distress.  Report given to patient's RN.   Access used: LUA AVG Access issues: none  Total UF removed: 2L Medication(s) given:  none  Post HD weight: 60.6 kg   Wolfgang Phoenix [facility]

## 2023-05-11 NOTE — Inpatient Diabetes Management (Signed)
Inpatient Diabetes Program Recommendations  AACE/ADA: New Consensus Statement on Inpatient Glycemic Control (2015)  Target Ranges:  Prepandial:   less than 140 mg/dL      Peak postprandial:   less than 180 mg/dL (1-2 hours)      Critically ill patients:  140 - 180 mg/dL   Lab Results  Component Value Date   GLUCAP 256 (H) 05/11/2023   HGBA1C 7.2 (H) 04/19/2023    Review of Glycemic Control  Latest Reference Range & Units 05/10/23 19:13 05/11/23 09:56  Glucose-Capillary 70 - 99 mg/dL 161 (H) 096 (H)  (H): Data is abnormally high Diabetes history: Type 2 DM Outpatient Diabetes medications: Levemir 4 units BID Current orders for Inpatient glycemic control: Levemir 4 units BID  Inpatient Diabetes Program Recommendations:    Consider adding Novolog 0-6 units TID. No current order placed for CBGs.   Thanks, Lujean Rave, MSN, RNC-OB Diabetes Coordinator [phone] (8a-5p)

## 2023-05-11 NOTE — Discharge Summary (Signed)
Deborah [name] ZOX:[id number] DOB: 13-Apr-1956 DOA: 05/10/2023  PCP: Si Gaul, MD  Admit date: 05/10/2023 Discharge date: 05/11/2023  Time spent: 35 minutes  Recommendations for Outpatient Follow-up:  Pcp f/u     Discharge Diagnoses:  Principal Problem:   Acute on chronic respiratory failure with hypoxia ([name]) Active Problems:   S/P MVR (mitral valve replacement)   PAF (paroxysmal atrial fibrillation) ([name])   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, W, F   Chronic anticoagulation   Tracheostomy status ([name])   Essential hypertension   Acute on chronic diastolic (congestive) heart failure ([name])   Diabetes mellitus type 2 in nonobese ([name])   Anemia of chronic disease   Coronary artery disease involving native coronary artery of native heart without angina pectoris   Diabetic neuropathy ([name])   Depression   Pulmonary hypertension ([name])   Discharge Condition: improved  Diet recommendation: low sodium  Filed Weights   05/10/23 1758 05/11/23 1200 05/11/23 1600  Weight: 62.4 kg 62.8 kg 60.6 kg    History of present illness:  From admission h and p Deborah [name] is a 67 y.o. female with medical history significant of end-stage renal disease on dialysis Monday Wednesday Friday, paroxysmal atrial fibrillation on Eliquis, history of tracheostomy, chronic diastolic heart failure, chronic hypoxia at night, uncontrolled type 2 diabetes, hypertension presenting with acute on chronic respiratory failure with hypoxia.  Patient noted to have been admitted May 28 through June 5 for similar issue of acute on chronic respiratory failure with hypoxia with noted community-acquired pneumonia which grew out Pseudomonas and Corynebacterium and completed antibiotics on May 26, diastolic CHF status post hemodialysis, as well as vibrio diarrhea status post azithromycin.  Patient reports worsening shortness of breath since she left the hospital.  Baseline 3-4 L through her trach on a regular basis.  Has had increased  use beyond 4 L.  Denies any fevers or chills.  No chest pain or abdominal pain.  Does report eating some salty foods.  Makes a trace amount of urine.  No lower extremity swelling.  Minimal orthopnea and PND.  Has been compliant with discharge regimen regimen.  No tobacco use.  No reported trach irritation though she does report having some episodes of mild blood streaked mucus with cleaning at times.   Hospital Course:  Patient presents with shortness of breath and increased o2 requirement (8 liters, is on 3-4 at home at baseline). Here CTA showed mild pulm edema and small effusions, no focal infiltrates, no PE. The was seen by nephrology and extra dialysis session (patient is on [facility]wf hemodialysis at home) performed on 6/6. Dialysis with ultrafiltration was performed again on 6/7. She is now weaned back to her home 3-4 liters O2 and reports her breathing has returned to its baseline. The presumed cause of her dyspnea and hypoxia is fluid overload from inadequate hemodialysis. Patient will resume mwf hemodialysis.   Procedures: hemodialysis   Consultations:  nephrology  Discharge Exam: Vitals:   05/11/23 1530 05/11/23 1600  BP: (!) 175/77 (!) 141/70  Pulse: 70 70  Resp: 16 13  Temp:    SpO2: 94% 96%    General: nad Cardiovascular: RRR Respiratory: rales at bases, otherwise clear Ext: no edema  Discharge Instructions   Discharge Instructions     Diet - low sodium heart healthy   Complete by: As directed    Increase activity slowly   Complete by: As directed       Allergies as of 05/11/2023  No Known Allergies      Medication List     TAKE these medications    acetaminophen 325 MG tablet Commonly known as: TYLENOL Take 650 mg by mouth every 6 (six) hours as needed for mild pain or fever.   albuterol (2.5 MG/3ML) 0.083% nebulizer solution Commonly known as: PROVENTIL Take 3 mLs (2.5 mg total) by nebulization 4 (four) times daily - after meals and at bedtime.    apixaban 2.5 MG Tabs tablet Commonly known as: ELIQUIS Take 2.5 mg by mouth 2 (two) times daily.   aspirin EC 81 MG tablet Take 1 tablet (81 mg total) by mouth daily.   atorvastatin 80 MG tablet Commonly known as: LIPITOR Take 80 mg by mouth daily at 6 PM.   carvedilol 6.25 MG tablet Commonly known as: COREG Take 1 tablet (6.25 mg total) by mouth 2 (two) times daily with a meal.   dextromethorphan 30 MG/5ML liquid Commonly known as: DELSYM Take 30 mg by mouth daily as needed for cough.   escitalopram 10 MG tablet Commonly known as: LEXAPRO Take 10 mg by mouth daily.   gabapentin 300 MG capsule Commonly known as: NEURONTIN Take 1 capsule (300 mg total) by mouth at bedtime.   guaiFENesin-dextromethorphan 100-10 MG/5ML syrup Commonly known as: ROBITUSSIN DM Take 5 mLs by mouth every 4 (four) hours as needed for cough.   Levemir FlexTouch 100 UNIT/ML FlexPen Generic drug: insulin detemir Inject 4 Units into the skin 2 (two) times daily.   Lidocaine (Anorectal) 5 % Crea Sparingly Topical As Directed   lidocaine-prilocaine cream Commonly known as: EMLA Apply 1 application topically every dialysis.   midodrine 5 MG tablet Commonly known as: PROAMATINE Take 5 mg by mouth 3 (three) times a week.   multivitamin Tabs tablet Take 1 tablet by mouth daily.   olmesartan 20 MG tablet Commonly known as: BENICAR Take 20 mg by mouth daily.   pantoprazole 40 MG tablet Commonly known as: PROTONIX Take 1 tablet (40 mg total) by mouth daily.   SENSIPAR PO Take 30 mg by mouth every Monday, Wednesday, and Friday with hemodialysis.   sevelamer carbonate 800 MG tablet Commonly known as: RENVELA Take 1,600 mg by mouth 3 (three) times daily with meals.   traZODone 50 MG tablet Commonly known as: DESYREL Take 50 mg by mouth 3 (three) times a week. Take on Dialysis days       No Known Allergies  Follow-up Information     Si Gaul, MD Follow up.   Specialty:  Internal Medicine Contact information: [address] [place] [address] [phone]                  The results of significant diagnostics from this hospitalization (including imaging, microbiology, ancillary and laboratory) are listed below for reference.    Significant Diagnostic Studies: CT Angio Chest PE W and/or Wo Contrast  Result Date: 05/10/2023 CLINICAL DATA:  Discharge yesterday after admission for pneumonia. Low O2 sats. EXAM: CT ANGIOGRAPHY CHEST WITH CONTRAST TECHNIQUE: Multidetector CT imaging of the chest was performed using the standard protocol during bolus administration of intravenous contrast. Multiplanar CT image reconstructions and MIPs were obtained to evaluate the vascular anatomy. RADIATION DOSE REDUCTION: This exam was performed according to the departmental dose-optimization program which includes automated exposure control, adjustment of the mA and/or kV according to patient size and/or use of iterative reconstruction technique. CONTRAST:  75mL OMNIPAQUE IOHEXOL 350 MG/ML SOLN COMPARISON:  Same day chest radiograph, CT  chest 04/23/2023 FINDINGS: Cardiovascular: There is adequate opacification of the pulmonary arteries to the segmental level. There is no evidence of pulmonary embolism. The heart is enlarged. A mitral valve prosthesis is noted. There is mild aortic valve calcification and scattered coronary artery calcification. The main pulmonary artery is enlarged measuring up to 3.8 cm. There is mild calcified plaque in the aortic arch. There is reflux of contrast into the IVC/hepatic veins. Mediastinum/Nodes: The thyroid is unremarkable. The esophagus is grossly unremarkable. There is no pathologic mediastinal, hilar, or axillary lymphadenopathy. Lungs/Pleura: The tracheostomy tube tip is in the midthoracic trachea. The trachea and central airways are patent. There is central bronchial wall thickening. There are small bilateral pleural effusions with patchy  airspace opacities in the left lower lobe which appears linear in the sagittal plane, favored to reflect atelectasis. There are patchy ground-glass and nodular opacities in the right middle lobe (7-80), right upper lobe (7-58, 40), and left upper lobe (7-40), worsened in the interim. There is interlobular septal thickening in both lungs. Upper Abdomen: Gallstones are noted without evidence of acute cholecystitis. Musculoskeletal: There is no acute osseous abnormality or suspicious osseous lesion. Review of the MIP images confirms the above findings. IMPRESSION: 1. No evidence of pulmonary embolism. 2. Cardiomegaly with reflux of contrast into the IVC/hepatic veins and enlarged pulmonary artery suggestive of right heart dysfunction and pulmonary hypertension. Additionally, small bilateral pleural effusions and pulmonary interstitial edema suggesting CHF. 3. Patchy ground-glass and nodular opacities in both lungs appear progressed since the study from 04/18/2023 and may reflect multifocal infection/inflammation or alveolar edema. Recommend follow-up chest CT in ~3 months to assess for resolution. 4. Cholelithiasis. Electronically Signed   By: Lesia Hausen M.D.   On: 05/10/2023 13:05   DG Chest Port 1 View  Result Date: 05/10/2023 CLINICAL DATA:  Sepsis EXAM: PORTABLE CHEST 1 VIEW COMPARISON:  X-ray 05/06/2023 FINDINGS: Enlarged cardiopericardial silhouette. Sternal wires. Prosthetic aortic valve. Tracheostomy tube. Left axillary vascular stent. Stable vascular congestion and mild edema. Persistent mild superimposed additional left lung base opacity with tiny effusion. No pneumothorax. Overlapping cardiac leads. IMPRESSION: No significant interval change when adjusting for technique Electronically Signed   By: Karen Kays M.D.   On: 05/10/2023 09:52   DG Chest Port 1 View  Result Date: 05/06/2023 CLINICAL DATA:  Pleural effusion on the left EXAM: PORTABLE CHEST 1 VIEW COMPARISON:  May 02, 2023 FINDINGS: No  pneumothorax. Stable tracheostomy tube. Stable cardiomediastinal silhouette. Bilateral interstitial opacities with more focal opacities in the bases. The findings are stable other than improving opacity in the left base. No other acute abnormalities. IMPRESSION: 1. Cardiomegaly with mild edema, stable in the interval. 2. Improving opacity in the left base. Recommend follow-up to complete resolution. Electronically Signed   By: Gerome Sam III M.D.   On: 05/06/2023 12:45   DG Chest Port 1 View  Result Date: 05/02/2023 CLINICAL DATA:  Follow-up atelectasis EXAM: PORTABLE CHEST 1 VIEW COMPARISON:  04/29/2023 FINDINGS: Cardiac shadow is enlarged but stable. Postsurgical changes are noted. Tracheostomy tube is noted in satisfactory position. Persistent left-sided effusion and basilar opacity is noted. Central vascular congestion is noted with changes of pulmonary edema similar to that noted on the prior exam. No new focal abnormality is seen. IMPRESSION: Stable appearance of the chest when compared with the previous exam. Electronically Signed   By: Alcide Clever M.D.   On: 05/02/2023 23:01   DG Chest Port 1 View  Result Date: 04/29/2023 CLINICAL DATA:  Atelectasis. EXAM:  PORTABLE CHEST 1 VIEW COMPARISON:  Radiograph and CT 04/23/2023 FINDINGS: Tracheostomy tube tip at the thoracic inlet. Median sternotomy and prosthetic cardiac valve. Stable cardiomegaly. Improvement in right pleural effusion. Similar left pleural effusion and basilar opacity. Pulmonary edema is unchanged. No pneumothorax. IMPRESSION: 1. Improvement in right pleural effusion. 2. Unchanged left pleural effusion and basilar opacity. 3. Unchanged pulmonary edema. Electronically Signed   By: Narda Rutherford M.D.   On: 04/29/2023 21:18   ECHOCARDIOGRAM COMPLETE  Result Date: 04/26/2023    ECHOCARDIOGRAM REPORT   Patient Name:   HELEENA LEHMKUHL Date of Exam: 04/26/2023 Medical Rec #:  [id number]     Height:       63.0 in Accession #:    [id number]     Weight:       142.9 lb Date of Birth:  [birth year]     BSA:          1.676 m Patient Age:    66 years      BP:           129/68 mmHg Patient Gender: F             HR:           73 bpm. Exam Location:  [facility] Procedure: 2D Echo, Cardiac Doppler and Color Doppler Indications:     CHF                  s/p Mitral valve replacement Z95.2  History:         Patient has prior history of Echocardiogram examinations, most                  recent 01/04/2022. Risk Factors:Diabetes and Hypertension. ESRD,                  Mitral valve replacement.  Sonographer:     Cristela Blue Referring Phys:  [id number] Lynn Ito Diagnosing Phys: Julien Nordmann MD IMPRESSIONS  1. Left ventricular ejection fraction, by estimation, is 55 to 60%. The left ventricle has normal function. The left ventricle has no regional wall motion abnormalities. There is moderate left ventricular hypertrophy. Left ventricular diastolic parameters are consistent with Grade II diastolic dysfunction (pseudonormalization).  2. Right ventricular systolic function is normal. The right ventricular size is normal. There is severely elevated pulmonary artery systolic pressure. The estimated right ventricular systolic pressure is 82.8 mmHg.  3. The mitral valve has been repaired/replaced. No evidence of mitral valve regurgitation. No evidence of mitral stenosis. The mean mitral valve gradient is 9.0 mmHg.  4. Tricuspid valve regurgitation is mild to moderate.  5. The aortic valve has an indeterminant number of cusps. Aortic valve regurgitation is not visualized. Aortic valve sclerosis is present, with no evidence of aortic valve stenosis.  6. The inferior vena cava is normal in size with greater than 50% respiratory variability, suggesting right atrial pressure of 3 mmHg. FINDINGS  Left Ventricle: Left ventricular ejection fraction, by estimation, is 55 to 60%. The left ventricle has normal function. The left ventricle has no regional wall motion abnormalities. The  left ventricular internal cavity size was normal in size. There is  moderate left ventricular hypertrophy. Left ventricular diastolic parameters are consistent with Grade II diastolic dysfunction (pseudonormalization). Right Ventricle: The right ventricular size is normal. No increase in right ventricular wall thickness. Right ventricular systolic function is normal. There is severely elevated pulmonary artery systolic pressure. The tricuspid regurgitant velocity is 4.41 m/s, and with an assumed right  atrial pressure of 5 mmHg, the estimated right ventricular systolic pressure is 82.8 mmHg. Left Atrium: Left atrial size was normal in size. Right Atrium: Right atrial size was normal in size. Pericardium: There is no evidence of pericardial effusion. Mitral Valve: The mitral valve has been repaired/replaced. No evidence of mitral valve regurgitation. 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 No evidence of mitral valve stenosis. MV peak gradient, 18.8 mmHg. The mean mitral valve gradient is 9.0 mmHg. Tricuspid Valve: The tricuspid valve is normal in structure. Tricuspid valve regurgitation is mild to moderate. No evidence of tricuspid stenosis. Aortic Valve: The aortic valve has an indeterminant number of cusps. Aortic valve regurgitation is not visualized. Aortic valve sclerosis is present, with no evidence of aortic valve stenosis. Aortic valve mean gradient measures 4.5 mmHg. Aortic valve peak gradient measures 8.7 mmHg. Aortic valve area, by VTI measures 2.20 cm. Pulmonic Valve: The pulmonic valve was normal in structure. Pulmonic valve regurgitation is not visualized. No evidence of pulmonic stenosis. Aorta: The aortic root is normal in size and structure. Venous: The inferior vena cava is normal in size with greater than 50% respiratory variability, suggesting right atrial pressure of 3 mmHg. IAS/Shunts: No atrial level shunt detected by color flow Doppler.  LEFT VENTRICLE PLAX 2D LVIDd:          3.90 cm   Diastology LVIDs:         2.30 cm   LV e' medial:    7.40 cm/s LV PW:         1.60 cm   LV E/e' medial:  24.2 LV IVS:        1.50 cm   LV e' lateral:   7.94 cm/s LVOT diam:     2.10 cm   LV E/e' lateral: 22.5 LV SV:         62 LV SV Index:   37 LVOT Area:     3.46 cm  RIGHT VENTRICLE RV Basal diam:  4.50 cm RV Mid diam:    3.40 cm RV S prime:     11.60 cm/s TAPSE (M-mode): 2.6 cm LEFT ATRIUM           Index        RIGHT ATRIUM           Index LA diam:      3.70 cm 2.21 cm/m   RA Area:     20.70 cm LA Vol (A2C): 14.1 ml 8.41 ml/m   RA Volume:   65.80 ml  39.26 ml/m LA Vol (A4C): 30.9 ml 18.44 ml/m  AORTIC VALVE AV Area (Vmax):    1.97 cm AV Area (Vmean):   2.14 cm AV Area (VTI):     2.20 cm AV Vmax:           147.50 cm/s AV Vmean:          97.250 cm/s AV VTI:            0.282 m AV Peak Grad:      8.7 mmHg AV Mean Grad:      4.5 mmHg LVOT Vmax:         83.80 cm/s LVOT Vmean:        60.000 cm/s LVOT VTI:          0.179 m LVOT/AV VTI ratio: 0.63  AORTA Ao Root diam: 3.10 cm MITRAL VALVE                TRICUSPID VALVE  MV Area (PHT): 2.94 cm     TR Peak grad:   77.8 mmHg MV Area VTI:   1.06 cm     TR Vmax:        441.00 cm/s MV Peak grad:  18.8 mmHg MV Mean grad:  9.0 mmHg     SHUNTS MV Vmax:       2.17 m/s     Systemic VTI:  0.18 m MV Vmean:      140.0 cm/s   Systemic Diam: 2.10 cm MV Decel Time: 258 msec MV E velocity: 179.00 cm/s MV A velocity: 149.00 cm/s MV E/A ratio:  1.20 Julien Nordmann MD Electronically signed by Julien Nordmann MD Signature Date/Time: 04/26/2023/12:58:36 PM    Final    CT CHEST WO CONTRAST  Result Date: 04/23/2023 CLINICAL DATA:  Respiratory illness, nondiagnostic xray, dyspnea EXAM: CT CHEST WITHOUT CONTRAST TECHNIQUE: Multidetector CT imaging of the chest was performed following the standard protocol without IV contrast. RADIATION DOSE REDUCTION: This exam was performed according to the departmental dose-optimization program which includes automated exposure control,  adjustment of the mA and/or kV according to patient size and/or use of iterative reconstruction technique. COMPARISON:  04/18/2023 FINDINGS: Cardiovascular: Moderate multi-vessel coronary artery calcification. Mitral valve replacement has been performed. Mild global cardiomegaly. No pericardial effusion. Central pulmonary arteries are enlarged in keeping with changes of pulmonary arterial hypertension. Mild atherosclerotic calcification within the thoracic aorta. No aortic aneurysm. Mediastinum/Nodes: Tracheostomy in expected position. Shotty right paratracheal, aortopulmonary, and subcarinal adenopathy is nonspecific, possibly reactive in nature. This appears progressive since prior examination. Visualized thyroid is unremarkable. The esophagus is unremarkable. Lungs/Pleura: Small bilateral pleural effusions are present, enlarged since prior examination, with increasing associated bibasilar compressive atelectasis. Interval development of mild interstitial and alveolar pulmonary edema with smooth interlobular septal thickening and diffuse ground-glass infiltrate. Increasing superimposed atelectasis within the left upper lobe. No pneumothorax. No central obstructing lesion. Upper Abdomen: No acute abnormality. Musculoskeletal: No acute bone abnormality. No lytic or blastic bone lesion. IMPRESSION: 1. Interval development of mild interstitial and alveolar pulmonary edema, small but enlarging bilateral pleural effusions, and mild global cardiomegaly in keeping with mild congestive heart failure. 2. Moderate multi-vessel coronary artery calcification. 3. Morphologic changes in keeping with pulmonary arterial hypertension. 4. Shotty mediastinal adenopathy, progressive since prior examination, possibly reactive in nature. Aortic Atherosclerosis (ICD10-I70.0). Electronically Signed   By: Helyn Numbers M.D.   On: 04/23/2023 20:55   DG Chest Portable 1 View  Result Date: 04/23/2023 CLINICAL DATA:  67 year old female  with shortness of breath. Recent hospitalization. EXAM: PORTABLE CHEST 1 VIEW COMPARISON:  Chest CT 04/18/2023. FINDINGS: Portable AP upright view at 0927 hours. Stable tracheostomy. Cardiac prosthetic valve and cardiomegaly. Stable cardiac size and mediastinal contours. Increasing bilateral lung base veiling opacity typical of a fusions, lower lung vascular congestion. Pleural effusion volume still probably small. Upper lung vascular congestion appears mildly improved from the previous chest x-ray. No pneumothorax. No definite air bronchograms. No acute osseous abnormality identified. Negative visible bowel gas. IMPRESSION: 1. Cardiomegaly with small bilateral pleural effusions and lung base atelectasis. But otherwise pulmonary vascular congestion appears improved since 04/18/2023. 2. Stable tracheostomy. Electronically Signed   By: Odessa Fleming M.D.   On: 04/23/2023 09:38   CT Chest Wo Contrast  Result Date: 04/18/2023 CLINICAL DATA:  Shortness of breath EXAM: CT CHEST WITHOUT CONTRAST TECHNIQUE: Multidetector CT imaging of the chest was performed following the standard protocol without IV contrast. RADIATION DOSE REDUCTION: This exam was performed according to  the departmental dose-optimization program which includes automated exposure control, adjustment of the mA and/or kV according to patient size and/or use of iterative reconstruction technique. COMPARISON:  Previous studies including the CT done on 2020-12-07 and chest radiograph done earlier today FINDINGS: Cardiovascular: There is ectasia of the main pulmonary artery measuring 3.6 cm suggesting pulmonary arterial hypertension. Coronary artery calcifications are seen. 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 Heart is enlarged in size. Metallic sutures are seen in the sternum. Mediastinum/Nodes: There are slightly enlarged nodes in mediastinum. This finding has not changed significantly. Lungs/Pleura: Tip of tracheostomy tube is slightly above the level of aortic  arch. Small right pleural effusion is seen. Minimal left pleural effusion is seen. There are patchy infiltrates in both perihilar regions and both lower lung fields, more so in the right lower lobe suggesting atelectasis/pneumonia. There is no pneumothorax. Upper Abdomen: Small hiatal hernia is seen. There are small calcifications in liver and spleen, possibly granulomas. Musculoskeletal: Sclerotic density seen in the spinous process of T2 vertebra has not changed, possibly benign bone island. There is subcutaneous edema and skin thickening in both breasts without any loculated fluid collections. Degenerative changes are noted in the visualized lower cervical spine with encroachment of neural foramina. IMPRESSION: Cardiomegaly. Coronary artery disease. There is ectasia of the main pulmonary artery suggesting possible pulmonary arterial hypertension. Small right pleural effusion. Minimal left pleural effusion. There are patchy infiltrates in mid and lower lung fields, more so in right lower lobe suggesting atelectasis/pneumonia. Small hiatal hernia. Other findings as described in the body of the report. Electronically Signed   By: Ernie Avena M.D.   On: 04/18/2023 20:23   DG Chest 2 View  Result Date: 04/18/2023 CLINICAL DATA:  Shortness of breath EXAM: CHEST - 2 VIEW COMPARISON:  Chest x-ray 01/04/2022 FINDINGS: Tracheostomy is unchanged in position. Patient is status post heart valve replacement. The heart is enlarged. There central pulmonary vascular congestion. There are increasing patchy opacities in both lung bases with new trace bilateral pleural effusions. No pneumothorax or acute fracture. Vascular stent/graft seen in the proximal left arm. IMPRESSION: 1. Cardiomegaly with central pulmonary vascular congestion. 2. Increasing patchy opacities in both lung bases with new trace bilateral pleural effusions. Electronically Signed   By: Darliss Cheney M.D.   On: 04/18/2023 16:04     Microbiology: Recent Results (from the past 240 hour(s))  Stool culture     Status: None   Collection Time: 05/01/23  8:51 PM   Specimen: Stool  Result Value Ref Range Status   Salmonella/Shigella Screen Final report  Final   Campylobacter Culture Final report  Final   E coli, Shiga toxin Assay Negative Negative Final    Comment: (NOTE) Performed At: [facility] [address] [place], [place] [id number] Jolene Schimke MD BJ:[id number]   STOOL CULTURE REFLEX - RSASHR     Status: None   Collection Time: 05/01/23  8:51 PM  Result Value Ref Range Status   Stool Culture result 1 (RSASHR) Comment  Final    Comment: (NOTE) No Salmonella or Shigella recovered. Performed At: [facility] [address] [place], [place] [id number] Jolene Schimke MD GE:[id number]   STOOL CULTURE Reflex - CMPCXR     Status: None   Collection Time: 05/01/23  8:51 PM  Result Value Ref Range Status   Stool Culture result 1 (CMPCXR) Comment  Final    Comment: (NOTE) No Campylobacter species isolated. Performed At: [facility] [address] [place], [place] [id number] Jolene Schimke  MD ZO:[id number]      Labs: Basic Metabolic Panel: Recent Labs  Lab 05/09/23 0736 05/10/23 0943 05/11/23 1230  NA 135 136 139  K 4.0 4.0 4.2  CL 97* 97* 106  CO2 28 27 23   GLUCOSE 157* 140* 227*  BUN 44* 34* 47*  CREATININE 8.59* 6.57* 8.07*  CALCIUM 8.9 9.6 8.7*  PHOS 5.6*  --   --    Liver Function Tests: Recent Labs  Lab 05/09/23 0736 05/10/23 0943 05/11/23 1230  AST  --  20 20  ALT  --  20 18  ALKPHOS  --  70 60  BILITOT  --  1.4* 1.0  PROT  --  7.3 6.1*  ALBUMIN 3.0* 3.3* 2.8*   No results for input(s): "LIPASE", "AMYLASE" in the last 168 hours. No results for input(s): "AMMONIA" in the last 168 hours. CBC: Recent Labs  Lab 05/09/23 0736 05/10/23 0943 05/11/23 1230  WBC 7.1 7.5 7.6  NEUTROABS  --  5.0  --   HGB 8.3* 9.2* 8.1*  HCT 25.7* 28.8* 25.4*  MCV 93.5  95.7 95.8  PLT 338 330 310   Cardiac Enzymes: No results for input(s): "CKTOTAL", "CKMB", "CKMBINDEX", "TROPONINI" in the last 168 hours. BNP: BNP (last 3 results) Recent Labs    04/18/23 1627 05/10/23 0943  BNP 1,400.4* 2,541.2*    ProBNP (last 3 results) No results for input(s): "PROBNP" in the last 8760 hours.  CBG: Recent Labs  Lab 05/08/23 2137 05/09/23 0835 05/09/23 1542 05/10/23 1913 05/11/23 0956  GLUCAP 133* 148* 138* 129* 256*       Signed:  Silvano Bilis MD.  [facility] 05/11/2023, 4:51 PM

## 2023-05-11 NOTE — Progress Notes (Signed)
[facility]  ROUNDING NOTE   Subjective:   [name] returns to [facility] on 05/10/2023 for Acute on chronic respiratory failure with hypoxia [facility]) [J96.21]  Patient is known to our practice and receives outpatient dialysis treatments at [facility] on a MWF schedule.    Patient seen sitting up in bed No family present  Denies shortness of breath Remains on 4L   Objective:  Vital signs in last 24 hours:  Temp:  [97.8 F (36.6 C)-98.5 F (36.9 C)] 98.2 F (36.8 C) (06/07 0535) Pulse Rate:  [62-87] 64 (06/07 0945) Resp:  [0-24] 16 (06/07 0945) BP: (105-156)/(47-97) 122/97 (06/07 0730) SpO2:  [75 %-100 %] 95 % (06/07 0945) FiO2 (%):  [40 %] 40 % (06/07 0852) Weight:  [62.4 kg-64.5 kg] 62.4 kg (06/06 1758)  Weight change:  Filed Weights   05/10/23 0909 05/10/23 1323 05/10/23 1758  Weight: 63.7 kg 64.5 kg 62.4 kg    Intake/Output: I/O last 3 completed shifts: In: -  Out: 2000 [Other:2000]   Intake/Output this shift:  No intake/output data recorded.  Physical Exam: General: NAD  Head: Normocephalic, atraumatic. Moist oral mucosal membranes  Eyes: Anicteric  Neck: +tracheostomy  Lungs:  Rhonchi bilaterally  Heart: Regular rate and rhythm  Abdomen:  Soft, nontender  Extremities: No peripheral edema.  Neurologic: Nonfocal, moving all four extremities  Skin: No lesions  Access: Left AVG    Basic Metabolic Panel: Recent Labs  Lab 05/04/23 1626 05/09/23 0736 05/10/23 0943  NA 134* 135 136  K 4.6 4.0 4.0  CL 95* 97* 97*  CO2 27 28 27   GLUCOSE 237* 157* 140*  BUN 42* 44* 34*  CREATININE 8.69* 8.59* 6.57*  CALCIUM 8.7* 8.9 9.6  PHOS 5.2* 5.6*  --      Liver Function Tests: Recent Labs  Lab 05/04/23 1626 05/09/23 0736 05/10/23 0943  AST  --   --  20  ALT  --   --  20  ALKPHOS  --   --  70  BILITOT  --   --  1.4*  PROT  --   --  7.3  ALBUMIN 3.0* 3.0* 3.3*    No results for input(s): "LIPASE", "AMYLASE" in the last 168  hours. No results for input(s): "AMMONIA" in the last 168 hours.  CBC: Recent Labs  Lab 05/04/23 1626 05/09/23 0736 05/10/23 0943  WBC 9.1 7.1 7.5  NEUTROABS  --   --  5.0  HGB 7.8* 8.3* 9.2*  HCT 24.4* 25.7* 28.8*  MCV 95.3 93.5 95.7  PLT 316 338 330     Cardiac Enzymes: No results for input(s): "CKTOTAL", "CKMB", "CKMBINDEX", "TROPONINI" in the last 168 hours.  BNP: Invalid input(s): "POCBNP"  CBG: Recent Labs  Lab 05/08/23 2137 05/09/23 0835 05/09/23 1542 05/10/23 1913 05/11/23 0956  GLUCAP 133* 148* 138* 129* 256*     Microbiology: Results for orders placed or performed during the hospital encounter of 04/23/23  Organism ID, Bacteria     Status: None (Preliminary result)   Collection Time: 04/18/23  8:20 PM  Result Value Ref Range Status   Organism ID, Bacteria REFERT  Final    Comment: (NOTE) Please refer to the following specimen for additional lab results. Please refer to spec # [id number] for test result. Camelia Moraru notified 05-02-2023 at 13:00 Performed At: [facility] [address] [place], [place] [id number] Jolene Schimke MD GN:[id number]    Source of Sample PENDING  Incomplete  Bacterial organism reflex  Status: None   Collection Time: 04/18/23  8:20 PM  Result Value Ref Range Status   Bacterial result 1 Comment  Final    Comment: (NOTE) Specimen has been received and testing has been initiated. Performed At: [facility] [address] [place], [place] [id number] Jolene Schimke MD EX:[id number]   Organism ID by MALDI     Status: None (Preliminary result)   Collection Time: 04/18/23  8:20 PM  Result Value Ref Range Status   Organism ID by MALDI Comment  Final    Comment: (NOTE) Specimen has been received. Testing has been initiated. Performed At: [facility] [address] [place], [place] [id number] Jolene Schimke MD YQ:[id number]    Source (MTB RIF) PENDING  Incomplete  Aerobic ID by MALDI      Status: None   Collection Time: 04/18/23  8:20 PM  Result Value Ref Range Status   Aerobic ID by MALDI Comment  Final    Comment: (NOTE) Unable to identify by MALDI. See Organism ID by Sequencing. Performed At: [facility] [address] [place], [place] [id number] Jolene Schimke MD RJ:[id number]   SARS Coronavirus 2 by RT PCR (hospital order, performed in [facility] hospital lab) *cepheid single result test* Anterior Nasal Swab     Status: None   Collection Time: 04/23/23  9:30 AM   Specimen: Anterior Nasal Swab  Result Value Ref Range Status   SARS Coronavirus 2 by RT PCR NEGATIVE NEGATIVE Final    Comment: (NOTE) SARS-CoV-2 target nucleic acids are NOT DETECTED.  The SARS-CoV-2 RNA is generally detectable in upper and lower respiratory specimens during the acute phase of infection. The lowest concentration of SARS-CoV-2 viral copies this assay can detect is 250 copies / mL. A negative result does not preclude SARS-CoV-2 infection and should not be used as the sole basis for treatment or other patient management decisions.  A negative result may occur with improper specimen collection / handling, submission of specimen other than nasopharyngeal swab, presence of viral mutation(s) within the areas targeted by this assay, and inadequate number of viral copies (<250 copies / mL). A negative result must be combined with clinical observations, patient history, and epidemiological information.  Fact Sheet for Patients:   RoadLapTop.co.za  Fact Sheet for Healthcare Providers: http://kim-miller.com/  This test is not yet approved or  cleared by the [place] FDA and has been authorized for detection and/or diagnosis of SARS-CoV-2 by FDA under an Emergency Use Authorization (EUA).  This EUA will remain in effect (meaning this test can be used) for the duration of the COVID-19 declaration under Section 564(b)(1) of the Act, 21  U.S.C. section 360bbb-3(b)(1), unless the authorization is terminated or revoked sooner.  Performed at [facility], [address] [place], [place] [address]   Culture, blood (Routine X 2) w Reflex to ID Panel     Status: None   Collection Time: 04/24/23  7:52 PM   Specimen: BLOOD  Result Value Ref Range Status   Specimen Description BLOOD RIGHT ANTECUBITAL  Final   Special Requests   Final    BOTTLES DRAWN AEROBIC AND ANAEROBIC Blood Culture adequate volume   Culture   Final    NO GROWTH 5 DAYS Performed at [facility], [address] [place], [place] [address]    Report Status 04/29/2023 FINAL  Final  Culture, blood (Routine X 2) w Reflex to ID Panel     Status: None   Collection Time: 04/24/23  7:56 PM  Specimen: BLOOD  Result Value Ref Range Status   Specimen Description BLOOD RIGHT ANTECUBITAL  Final   Special Requests   Final    BOTTLES DRAWN AEROBIC AND ANAEROBIC Blood Culture adequate volume   Culture   Final    NO GROWTH 5 DAYS Performed at [facility], [address] [place], [place] [address]    Report Status 04/29/2023 FINAL  Final  Respiratory (~20 pathogens) panel by PCR     Status: None   Collection Time: 04/25/23  2:01 PM   Specimen: Nasopharyngeal Swab; Respiratory  Result Value Ref Range Status   Adenovirus NOT DETECTED NOT DETECTED Final   Coronavirus 229E NOT DETECTED NOT DETECTED Final    Comment: (NOTE) The Coronavirus on the Respiratory Panel, DOES NOT test for the novel  Coronavirus (2019 nCoV)    Coronavirus HKU1 NOT DETECTED NOT DETECTED Final   Coronavirus NL63 NOT DETECTED NOT DETECTED Final   Coronavirus OC43 NOT DETECTED NOT DETECTED Final   Metapneumovirus NOT DETECTED NOT DETECTED Final   Rhinovirus / Enterovirus NOT DETECTED NOT DETECTED Final   Influenza A NOT DETECTED NOT DETECTED Final   Influenza B NOT DETECTED NOT DETECTED Final   Parainfluenza Virus 1 NOT DETECTED NOT DETECTED Final    Parainfluenza Virus 2 NOT DETECTED NOT DETECTED Final   Parainfluenza Virus 3 NOT DETECTED NOT DETECTED Final   Parainfluenza Virus 4 NOT DETECTED NOT DETECTED Final   Respiratory Syncytial Virus NOT DETECTED NOT DETECTED Final   Bordetella pertussis NOT DETECTED NOT DETECTED Final   Bordetella Parapertussis NOT DETECTED NOT DETECTED Final   Chlamydophila pneumoniae NOT DETECTED NOT DETECTED Final   Mycoplasma pneumoniae NOT DETECTED NOT DETECTED Final    Comment: Performed at [facility], [address] [place], [place] [address]  Gastrointestinal Panel by PCR , Stool     Status: Abnormal   Collection Time: 04/26/23  6:56 AM   Specimen: Stool  Result Value Ref Range Status   Campylobacter species NOT DETECTED NOT DETECTED Final   Plesimonas shigelloides NOT DETECTED NOT DETECTED Final   Salmonella species NOT DETECTED NOT DETECTED Final   Yersinia enterocolitica NOT DETECTED NOT DETECTED Final   Vibrio species DETECTED (A) NOT DETECTED Final    Comment: RESULT CALLED TO, READ BACK BY AND VERIFIED WITH: JOSEPH HAYES AT 0981 04/29/23.PMF    Vibrio cholerae DETECTED (A) NOT DETECTED Final    Comment: RESULT CALLED TO, READ BACK BY AND VERIFIED WITH: JOSEPH HAYES AT 1914 04/29/23.PMF    Enteroaggregative E coli (EAEC) NOT DETECTED NOT DETECTED Final   Enteropathogenic E coli (EPEC) NOT DETECTED NOT DETECTED Final   Enterotoxigenic E coli (ETEC) NOT DETECTED NOT DETECTED Final   Shiga like toxin producing E coli (STEC) NOT DETECTED NOT DETECTED Final   Shigella/Enteroinvasive E coli (EIEC) NOT DETECTED NOT DETECTED Final   Cryptosporidium NOT DETECTED NOT DETECTED Final   Cyclospora cayetanensis NOT DETECTED NOT DETECTED Final   Entamoeba histolytica NOT DETECTED NOT DETECTED Final   Giardia lamblia NOT DETECTED NOT DETECTED Final   Adenovirus F40/41 NOT DETECTED NOT DETECTED Final   Astrovirus NOT DETECTED NOT DETECTED Final   Norovirus GI/GII NOT DETECTED NOT DETECTED Final    Rotavirus A NOT DETECTED NOT DETECTED Final   Sapovirus (I, II, IV, and V) NOT DETECTED NOT DETECTED Final    Comment: Performed at [facility], [address], [place], [place] [address]  Stool culture     Status: None  Collection Time: 05/01/23  8:51 PM   Specimen: Stool  Result Value Ref Range Status   Salmonella/Shigella Screen Final report  Final   Campylobacter Culture Final report  Final   E coli, Shiga toxin Assay Negative Negative Final    Comment: (NOTE) Performed At: [facility] [address] [place], [place] [id number] Jolene Schimke MD WU:[id number]   STOOL CULTURE REFLEX - RSASHR     Status: None   Collection Time: 05/01/23  8:51 PM  Result Value Ref Range Status   Stool Culture result 1 (RSASHR) Comment  Final    Comment: (NOTE) No Salmonella or Shigella recovered. Performed At: [facility] [address] [place], [place] [id number] Jolene Schimke MD VH:[id number]   STOOL CULTURE Reflex - CMPCXR     Status: None   Collection Time: 05/01/23  8:51 PM  Result Value Ref Range Status   Stool Culture result 1 (CMPCXR) Comment  Final    Comment: (NOTE) No Campylobacter species isolated. Performed At: [facility] [address] [place], [place] [id number] Jolene Schimke MD UV:[id number]     Coagulation Studies: No results for input(s): "LABPROT", "INR" in the last 72 hours.   Urinalysis: No results for input(s): "COLORURINE", "LABSPEC", "PHURINE", "GLUCOSEU", "HGBUR", "BILIRUBINUR", "KETONESUR", "PROTEINUR", "UROBILINOGEN", "NITRITE", "LEUKOCYTESUR" in the last 72 hours.  Invalid input(s): "APPERANCEUR"    Imaging: CT Angio Chest PE W and/or Wo Contrast  Result Date: 05/10/2023 CLINICAL DATA:  Discharge yesterday after admission for pneumonia. Low O2 sats. EXAM: CT ANGIOGRAPHY CHEST WITH CONTRAST TECHNIQUE: Multidetector CT imaging of the chest was performed using the standard protocol during bolus administration of  intravenous contrast. Multiplanar CT image reconstructions and MIPs were obtained to evaluate the vascular anatomy. RADIATION DOSE REDUCTION: This exam was performed according to the departmental dose-optimization program which includes automated exposure control, adjustment of the mA and/or kV according to patient size and/or use of iterative reconstruction technique. CONTRAST:  75mL OMNIPAQUE IOHEXOL 350 MG/ML SOLN COMPARISON:  Same day chest radiograph, CT chest 04/23/2023 FINDINGS: Cardiovascular: There is adequate opacification of the pulmonary arteries to the segmental level. There is no evidence of pulmonary embolism. The heart is enlarged. A mitral valve prosthesis is noted. There is mild aortic valve calcification and scattered coronary artery calcification. The main pulmonary artery is enlarged measuring up to 3.8 cm. There is mild calcified plaque in the aortic arch. There is reflux of contrast into the IVC/hepatic veins. Mediastinum/Nodes: The thyroid is unremarkable. The esophagus is grossly unremarkable. There is no pathologic mediastinal, hilar, or axillary lymphadenopathy. Lungs/Pleura: The tracheostomy tube tip is in the midthoracic trachea. The trachea and central airways are patent. There is central bronchial wall thickening. There are small bilateral pleural effusions with patchy airspace opacities in the left lower lobe which appears linear in the sagittal plane, favored to reflect atelectasis. There are patchy ground-glass and nodular opacities in the right middle lobe (7-80), right upper lobe (7-58, 40), and left upper lobe (7-40), worsened in the interim. There is interlobular septal thickening in both lungs. Upper Abdomen: Gallstones are noted without evidence of acute cholecystitis. Musculoskeletal: There is no acute osseous abnormality or suspicious osseous lesion. Review of the MIP images confirms the above findings. IMPRESSION: 1. No evidence of pulmonary embolism. 2. Cardiomegaly with  reflux of contrast into the IVC/hepatic veins and enlarged pulmonary artery suggestive of right heart dysfunction and pulmonary hypertension. Additionally, small bilateral pleural effusions and pulmonary interstitial edema suggesting CHF. 3. Patchy ground-glass and nodular opacities in both  lungs appear progressed since the study from 04/18/2023 and may reflect multifocal infection/inflammation or alveolar edema. Recommend follow-up chest CT in ~3 months to assess for resolution. 4. Cholelithiasis. Electronically Signed   By: Lesia Hausen M.D.   On: 05/10/2023 13:05   DG Chest Port 1 View  Result Date: 05/10/2023 CLINICAL DATA:  Sepsis EXAM: PORTABLE CHEST 1 VIEW COMPARISON:  X-ray 05/06/2023 FINDINGS: Enlarged cardiopericardial silhouette. Sternal wires. Prosthetic aortic valve. Tracheostomy tube. Left axillary vascular stent. Stable vascular congestion and mild edema. Persistent mild superimposed additional left lung base opacity with tiny effusion. No pneumothorax. Overlapping cardiac leads. IMPRESSION: No significant interval change when adjusting for technique Electronically Signed   By: Karen Kays M.D.   On: 05/10/2023 09:52     Medications:    sodium chloride       albuterol  2.5 mg Nebulization TID   apixaban  2.5 mg Oral BID   aspirin EC  81 mg Oral Daily   atorvastatin  80 mg Oral q1800   carvedilol  6.25 mg Oral BID WC   escitalopram  10 mg Oral Daily   gabapentin  300 mg Oral QHS   insulin detemir  4 Units Subcutaneous BID   irbesartan  150 mg Oral Daily   midodrine  5 mg Oral Once per day on Mon Wed Fri   multivitamin  1 tablet Oral Daily   pantoprazole  40 mg Oral Daily   sevelamer carbonate  1,600 mg Oral TID WC   sodium chloride flush  3 mL Intravenous Q12H   traZODone  50 mg Oral Once per day on Mon Wed Fri   sodium chloride, acetaminophen, ondansetron **OR** ondansetron (ZOFRAN) IV, sodium chloride flush  Assessment/ Plan:  Ms. ADUT AEGERTER is a 67 y.o.  female with  end stage renal disease on hemodialysis, tracheostomy, hypertension, hyperlipidemia, diabetes mellitus type II insulin dependent, depression, and diabetic neuropathy who presents to [facility] for 05/10/2023 for Acute on chronic respiratory failure with hypoxia ([name]) [J96.21]  [facility] MWF [address] [address] Left AVG 62kg  Acute respiratory failure with end-stage renal disease on hemodialysis.   Dialysis received yesterday with 2L fluid removal. Will provide patient scheduled dialysis today, to maintain outpatient schedule. Will attempt additional 2L UF today to achieve dry weight.   Hypertension with chronic kidney disease: current regimen of irbesartan, carvedilol and amlodipine. Home regimen of olmesartan instead of irbesartan.  Midodrine available for dialysis.  Diabetes mellitus type II with chronic kidney disease: insulin dependent.  - Glucose elevated this morning  Secondary Hyperparathyroidism -Currently prescribed sevelamer with meals outpatient.  Anemia of chronic kidney disease: -Hemoglobin stable.  No need for ESA's at this time.  LOS: 0   6/7/20249:58 AM

## 2023-05-11 NOTE — ED Notes (Signed)
Pt placed on 4L [place] by Dr Ashok Pall, [facility].

## 2023-05-11 NOTE — ED Notes (Signed)
Pt to dialysis at this time

## 2023-05-11 NOTE — ED Notes (Signed)
Pt does not have her o2 tank with her but reports to this nurse that she still wants to be discharged home with her daughter. Daughter and patient advised that I cannot make that decision for them. However, I did check patient's RA Sao2 for daughter and they were 91 - 92% room air. Patient says she lives 6 mins from Hospital and will be ok until she gets home where she has her own o2 tank. Patient and daughter in agreement with this.

## 2023-05-28 LAB — ORGANISM ID BY MALDI

## 2023-05-28 LAB — AEROBIC ID BY MALDI

## 2023-06-04 ENCOUNTER — Other Ambulatory Visit: Payer: Self-pay

## 2023-06-04 ENCOUNTER — Emergency Department: Payer: 59

## 2023-06-04 ENCOUNTER — Emergency Department
Admission: EM | Admit: 2023-06-04 | Discharge: 2023-06-05 | Disposition: A | Discharge status: Home / Self Care | Payer: 59 | Attending: Emergency Medicine | Admitting: Emergency Medicine

## 2023-06-04 DIAGNOSIS — Z992 Dependence on renal dialysis: Secondary | ICD-10-CM | POA: Insufficient documentation

## 2023-06-04 DIAGNOSIS — N186 End stage renal disease: Secondary | ICD-10-CM | POA: Diagnosis not present

## 2023-06-04 DIAGNOSIS — Z7901 Long term (current) use of anticoagulants: Secondary | ICD-10-CM | POA: Diagnosis not present

## 2023-06-04 DIAGNOSIS — T82838A Hemorrhage of vascular prosthetic devices, implants and grafts, initial encounter: Secondary | ICD-10-CM | POA: Insufficient documentation

## 2023-06-04 DIAGNOSIS — R58 Hemorrhage, not elsewhere classified: Secondary | ICD-10-CM

## 2023-06-04 LAB — MAGNESIUM: Magnesium: 1.9 mg/dL (ref 1.7–2.4)

## 2023-06-04 LAB — CBC
HCT: 23.1 % — ABNORMAL LOW (ref 36.0–46.0)
Hemoglobin: 7.7 g/dL — ABNORMAL LOW (ref 12.0–15.0)
MCH: 31.3 pg (ref 26.0–34.0)
MCHC: 33.3 g/dL (ref 30.0–36.0)
MCV: 93.9 fL (ref 80.0–100.0)
Platelets: 205 10*3/uL (ref 150–400)
RBC: 2.46 MIL/uL — ABNORMAL LOW (ref 3.87–5.11)
RDW: 16.1 % — ABNORMAL HIGH (ref 11.5–15.5)
WBC: 10.6 10*3/uL — ABNORMAL HIGH (ref 4.0–10.5)
nRBC: 0 % (ref 0.0–0.2)

## 2023-06-04 LAB — BASIC METABOLIC PANEL
Anion gap: 12 (ref 5–15)
BUN: 35 mg/dL — ABNORMAL HIGH (ref 8–23)
CO2: 29 mmol/L (ref 22–32)
Calcium: 8.1 mg/dL — ABNORMAL LOW (ref 8.9–10.3)
Chloride: 96 mmol/L — ABNORMAL LOW (ref 98–111)
Creatinine, Ser: 5.53 mg/dL — ABNORMAL HIGH (ref 0.44–1.00)
GFR, Estimated: 8 mL/min — ABNORMAL LOW (ref >60)
Glucose, Bld: 164 mg/dL — ABNORMAL HIGH (ref 70–99)
Potassium: 3.3 mmol/L — ABNORMAL LOW (ref 3.5–5.1)
Sodium: 137 mmol/L (ref 135–145)

## 2023-06-04 LAB — PROTIME-INR
INR: 1.9 — ABNORMAL HIGH (ref 0.8–1.2)
Prothrombin Time: 21.7 seconds — ABNORMAL HIGH (ref 11.4–15.2)

## 2023-06-04 LAB — PHOSPHORUS: Phosphorus: 2.4 mg/dL — ABNORMAL LOW (ref 2.5–4.6)

## 2023-06-04 NOTE — ED Triage Notes (Signed)
Pt is also on Elquis and last time she had this same issue she had to have a stitch or 2.

## 2023-06-04 NOTE — ED Triage Notes (Signed)
Pt is coming from home and called ems d/t av fistula to left arm started bleeding after she took of the clamp. Pt sts the clamp was applied at approx 1415 today after she completed hemodialysis. Bleeding is controlled by patient made bandage. Pt denies pain.

## 2023-06-04 NOTE — ED Provider Notes (Signed)
[facility] Provider Note    Event Date/Time   First MD Initiated Contact with Patient 06/04/23 1955     (approximate)   History   Vascular Access Problem   HPI  Deborah [name] is a 67 y.o. female past medical history significant for ESRD on HD MWF with left AV fistula who presents to the emergency department with bleeding from her left AV fistula.  Got home from dialysis at 3 PM and started noting bright red blood bleeding from her left AV fistula.  Applied pressure with her son.  Continues to bleed.  States that last time this happened she had to have stitches placed.  Takes Eliquis twice a day, she is uncertain of why she takes it.  Last dose this morning.     Physical Exam   Triage Vital Signs: ED Triage Vitals  Enc Vitals Group     BP 06/04/23 2004 132/83     Pulse Rate 06/04/23 2004 74     Resp 06/04/23 2004 18     Temp 06/04/23 2008 98.4 F (36.9 C)     Temp Source 06/04/23 2008 Oral     SpO2 06/04/23 2004 100 %     Weight --      Height --      Head Circumference --      Peak Flow --      Pain Score --      Pain Loc --      Pain Edu? --      Excl. in GC? --     Most recent vital signs: Vitals:   06/04/23 2200 06/04/23 2214  BP: 129/88   Pulse: 70   Resp:  20  Temp:    SpO2: 100%     Physical Exam Constitutional:      Appearance: She is well-developed.  HENT:     Head: Atraumatic.  Eyes:     Conjunctiva/sclera: Conjunctivae normal.  Neck:     Comments: Tracheostomy in place Cardiovascular:     Rate and Rhythm: Regular rhythm.  Pulmonary:     Effort: No respiratory distress.  Abdominal:     General: There is no distension.  Musculoskeletal:        General: Normal range of motion.     Cervical back: Normal range of motion.  Skin:    General: Skin is warm.     Comments: Left AV fistula with pulsatile bright red blood  Neurological:     Mental Status: She is alert. Mental status is at baseline.     IMPRESSION /  MDM / ASSESSMENT AND PLAN / ED COURSE  I reviewed the triage vital signs and the nursing notes.  Differential diagnosis including bleeding fistula, aneurysm, pseudoaneurysm  RADIOLOGY Ultrasound read as no acute findings, patent fistula, no pseudoaneurysm or aneurysm  LABS (all labs ordered are listed, but only abnormal results are displayed) Labs interpreted as -    Labs Reviewed  CBC - Abnormal; Notable for the following components:      Result Value   WBC 10.6 (*)    RBC 2.46 (*)    Hemoglobin 7.7 (*)    HCT 23.1 (*)    RDW 16.1 (*)    All other components within normal limits  BASIC METABOLIC PANEL - Abnormal; Notable for the following components:   Potassium 3.3 (*)    Chloride 96 (*)    Glucose, Bld 164 (*)    BUN 35 (*)  Creatinine, Ser 5.53 (*)    Calcium 8.1 (*)    GFR, Estimated 8 (*)    All other components within normal limits  PROTIME-INR - Abnormal; Notable for the following components:   Prothrombin Time 21.7 (*)    INR 1.9 (*)    All other components within normal limits  PHOSPHORUS - Abnormal; Notable for the following components:   Phosphorus 2.4 (*)    All other components within normal limits  MAGNESIUM     MDM    Anemia with a hemoglobin of 7.7 which is close to her baseline  Pressure dressing was applied Surgicel and clotting powder applied to the left AV fistula with vascular clamp.  On reevaluation hemostatic.  Ultrasound reassuring.  Discussed keeping dressing in place until she can follow-up.  Patient due for dialysis that would stay.  Discussed holding Eliquis tonight and taking her next dose tomorrow morning.  PROCEDURES:  Critical Care performed: No  Procedures  Patient's presentation is most consistent with acute presentation with potential threat to life or bodily function.   MEDICATIONS ORDERED IN ED: Medications - No data to display  FINAL CLINICAL IMPRESSION(S) / ED DIAGNOSES   Final diagnoses:  Bleeding     Rx  / DC Orders   ED Discharge Orders     None        Note:  This document was prepared using Dragon voice recognition software and may include unintentional dictation errors.   Corena Herter, MD 06/05/23 [phone]

## 2023-06-05 NOTE — Discharge Instructions (Signed)
You are seen in the emergency department for bleeding from your left AV fistula.  Your ultrasound was normal.  Hold your Eliquis dose tonight and you can take your next dose tomorrow morning.  Keep the dressing in place until you follow-up on Wednesday for dialysis.  Return to the emergency department if you have return of arterial bleeding.

## 2023-06-05 NOTE — ED Notes (Signed)
Av fistula to left arm was wrapped with coban. No active bleeding present. Pt is not in any discomfort.

## 2023-06-06 LAB — AEROBIC BACTERIA, ID BY SEQ.

## 2023-06-08 LAB — ORGANISM ID BY MALDI

## 2023-06-15 LAB — CULTURE, BLOOD (ROUTINE X 2): Special Requests: ADEQUATE

## 2023-08-21 ENCOUNTER — Other Ambulatory Visit: Payer: Self-pay

## 2023-08-21 ENCOUNTER — Emergency Department
Admission: EM | Admit: 2023-08-21 | Discharge: 2023-08-21 | Disposition: A | Discharge status: Home / Self Care | Payer: 59 | Attending: Emergency Medicine | Admitting: Emergency Medicine

## 2023-08-21 ENCOUNTER — Encounter: Payer: Self-pay | Admitting: Emergency Medicine

## 2023-08-21 DIAGNOSIS — T82838A Hemorrhage of vascular prosthetic devices, implants and grafts, initial encounter: Secondary | ICD-10-CM | POA: Insufficient documentation

## 2023-08-21 DIAGNOSIS — Y732 Prosthetic and other implants, materials and accessory gastroenterology and urology devices associated with adverse incidents: Secondary | ICD-10-CM | POA: Insufficient documentation

## 2023-08-21 LAB — CBC WITH DIFFERENTIAL/PLATELET
Abs Immature Granulocytes: 0.02 10*3/uL (ref 0.00–0.07)
Basophils Absolute: 0.1 10*3/uL (ref 0.0–0.1)
Basophils Relative: 1 %
Eosinophils Absolute: 0.7 10*3/uL — ABNORMAL HIGH (ref 0.0–0.5)
Eosinophils Relative: 9 %
HCT: 33 % — ABNORMAL LOW (ref 36.0–46.0)
Hemoglobin: 10.6 g/dL — ABNORMAL LOW (ref 12.0–15.0)
Immature Granulocytes: 0 %
Lymphocytes Relative: 14 %
Lymphs Abs: 1.1 10*3/uL (ref 0.7–4.0)
MCH: 29.2 pg (ref 26.0–34.0)
MCHC: 32.1 g/dL (ref 30.0–36.0)
MCV: 90.9 fL (ref 80.0–100.0)
Monocytes Absolute: 1 10*3/uL (ref 0.1–1.0)
Monocytes Relative: 14 %
Neutro Abs: 4.6 10*3/uL (ref 1.7–7.7)
Neutrophils Relative %: 62 %
Platelets: 193 10*3/uL (ref 150–400)
RBC: 3.63 MIL/uL — ABNORMAL LOW (ref 3.87–5.11)
RDW: 17.1 % — ABNORMAL HIGH (ref 11.5–15.5)
WBC: 7.5 10*3/uL (ref 4.0–10.5)
nRBC: 0 % (ref 0.0–0.2)

## 2023-08-21 LAB — BASIC METABOLIC PANEL
Anion gap: 13 (ref 5–15)
BUN: 56 mg/dL — ABNORMAL HIGH (ref 8–23)
CO2: 28 mmol/L (ref 22–32)
Calcium: 8.1 mg/dL — ABNORMAL LOW (ref 8.9–10.3)
Chloride: 100 mmol/L (ref 98–111)
Creatinine, Ser: 7.43 mg/dL — ABNORMAL HIGH (ref 0.44–1.00)
GFR, Estimated: 6 mL/min — ABNORMAL LOW (ref >60)
Glucose, Bld: 110 mg/dL — ABNORMAL HIGH (ref 70–99)
Potassium: 3.8 mmol/L (ref 3.5–5.1)
Sodium: 141 mmol/L (ref 135–145)

## 2023-08-21 LAB — CBG MONITORING, ED: Glucose-Capillary: 110 mg/dL — ABNORMAL HIGH (ref 70–99)

## 2023-08-21 NOTE — ED Provider Triage Note (Signed)
Emergency Medicine Provider Triage Evaluation Note  Deborah [name] , a 67 y.o. female  was evaluated in triage.  Pt complains of fistula started bleeding this morning. Has not stopped since. She went to dialysis yesterday and did not have any issues. Patient states she did not have any injuries that proceeded the bleeding.   Review of Systems  Positive: Bleeding fistula Negative: pain  Physical Exam  There were no vitals taken for this visit. Gen:   Awake, no distress   Resp:  Normal effort  MSK:   Moves extremities without difficulty  Other:  Bandage to the left upper arm, blood soaked gauze. There is no palpable thrill over the fistula.  Medical Decision Making  Medically screening exam initiated at 5:37 PM.  Appropriate orders placed.  Deborah [name] was informed that the remainder of the evaluation will be completed by another provider, this initial triage assessment does not replace that evaluation, and the importance of remaining in the ED until their evaluation is complete.  Fistula was redressed with gauze and coban.   Cameron Ali, PA-C 08/21/23 1744

## 2023-08-21 NOTE — ED Provider Notes (Signed)
[facility] Provider Note   Event Date/Time   First MD Initiated Contact with Patient 08/21/23 1946     (approximate) History  Vascular Access Problem  HPI Deborah [name] is a 67 y.o. female with a past medical history of end-stage renal disease on dialysis with AV fistula in place to left upper extremity as well as tracheostomy chronically with 3 L at baseline who presents for continued bleeding from her left AV fistula after dialysis today.  Patient states that a did not do any wound care other than what was routine in the dialysis center. ROS: Patient currently denies any vision changes, tinnitus, difficulty speaking, facial droop, sore throat, chest pain, shortness of breath, abdominal pain, nausea/vomiting/diarrhea, dysuria, or weakness/numbness/paresthesias in any extremity   Physical Exam  Triage Vital Signs: ED Triage Vitals  Encounter Vitals Group     BP 08/21/23 1740 (!) 170/70     Systolic BP Percentile --      Diastolic BP Percentile --      Pulse Rate 08/21/23 1740 67     Resp 08/21/23 1740 18     Temp 08/21/23 1740 97.6 F (36.4 C)     Temp Source 08/21/23 1740 Oral     SpO2 08/21/23 1740 100 %     Weight 08/21/23 1741 136 lb (61.7 kg)     Height 08/21/23 1741 5\' 2"  (1.575 m)     Head Circumference --      Peak Flow --      Pain Score 08/21/23 1741 0     Pain Loc --      Pain Education --      Exclude from Growth Chart --    Most recent vital signs: Vitals:   08/21/23 1740 08/21/23 2043  BP: (!) 170/70 (!) 182/79  Pulse: 67 68  Resp: 18 16  Temp: 97.6 F (36.4 C)   SpO2: 100% 98%   General: Awake, oriented x4. CV:  Good peripheral perfusion.  Resp:  Normal effort.  Abd:  No distention.  Other:  Elderly well-developed, well-nourished African-American female resting comfortably in no acute distress.  Left upper extremity AV fistula in place with small amount of oozing blood from the access site ED Results / Procedures / Treatments   Labs (all labs ordered are listed, but only abnormal results are displayed) Labs Reviewed  CBC WITH DIFFERENTIAL/PLATELET - Abnormal; Notable for the following components:      Result Value   RBC 3.63 (*)    Hemoglobin 10.6 (*)    HCT 33.0 (*)    RDW 17.1 (*)    Eosinophils Absolute 0.7 (*)    All other components within normal limits  BASIC METABOLIC PANEL - Abnormal; Notable for the following components:   Glucose, Bld 110 (*)    BUN 56 (*)    Creatinine, Ser 7.43 (*)    Calcium 8.1 (*)    GFR, Estimated 6 (*)    All other components within normal limits  PROCEDURES: Critical Care performed: No Procedures MEDICATIONS ORDERED IN ED: Medications - No data to display IMPRESSION / MDM / ASSESSMENT AND PLAN / ED COURSE  I reviewed the triage vital signs and the nursing notes.                             The patient is on the cardiac monitor to evaluate for evidence of arrhythmia and/or significant heart rate changes. Patient's  presentation is most consistent with acute presentation with potential threat to life or bodily function. Patient is a 67 year old female that presents for a bleeding AV fistula that is persistent upon arrival.  Patient did take her Eliquis yesterday but did not take it this morning.  Dressing changed approximately 1 hour prior to arrival and it has soaked through at this time.  Patient was dressed with Surgicel initially which did not abate this bleeding.  Patient was then closed with Dermabond as well as another application of Surgicel with adequate resolution of patient's bleeding.  Patient was monitored for an hour after application without any breakthrough bleeding and patient was discharged without complication.  Patient struck to follow-up with their primary care physician in the next 1-3 days for further evaluation and management.  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   Merwyn Katos, MD 08/21/23 2300

## 2023-08-21 NOTE — ED Notes (Signed)
This writer changed bandage on pts left arm at fistula site.  Upon taking off bandage, there was noted to be one small area of bleeding.  Blood seen oozing from area at time of bandage being unwrapped.  New 4x4 gauze applied and wrapped tightly with coban.

## 2023-08-21 NOTE — ED Triage Notes (Addendum)
Patient to ED via [facility] from home for dialysis fistula bleeding. Had dialysis yesterday- normal session. Started bleeding today- no injuries. Blood noted to be coming through dressing- changed approx 1hr ago. On blood thinners. Has trach collar in place- wears 3L at baseline.   New dressing placed in triage.

## 2024-07-22 ENCOUNTER — Ambulatory Visit (INDEPENDENT_AMBULATORY_CARE_PROVIDER_SITE_OTHER): Admitting: Podiatry

## 2024-07-22 ENCOUNTER — Encounter: Payer: Self-pay | Admitting: Podiatry

## 2024-07-22 VITALS — Ht 62.0 in | Wt 136.0 lb

## 2024-07-22 DIAGNOSIS — M79675 Pain in left toe(s): Secondary | ICD-10-CM | POA: Diagnosis not present

## 2024-07-22 DIAGNOSIS — M79674 Pain in right toe(s): Secondary | ICD-10-CM

## 2024-07-22 DIAGNOSIS — B351 Tinea unguium: Secondary | ICD-10-CM | POA: Diagnosis not present

## 2024-08-05 NOTE — Progress Notes (Signed)
   SUBJECTIVE Patient with a history of diabetes mellitus presents to office today complaining of elongated, thickened nails that cause pain while ambulating in shoes.  Patient is unable to trim their own nails. Patient is here for further evaluation and treatment.   Past Medical History:  Diagnosis Date   Acute on chronic diastolic CHF (congestive heart failure) ([name]) 01/04/2022   Atrial fibrillation ([name])    Atrial flutter ([name])    Cardiogenic shock ([name]) 08/17/2016   Diabetes mellitus    Diabetes mellitus type 2, controlled ([name]) 03/08/2012   ESRD (end stage renal disease) ([name])    Heart attack ([name])    Hyperlipidemia    Hypertension    MI, old    NSTEMI (non-ST elevated myocardial infarction) ([name])    Renal disorder    dialysls   Respiratory failure ([name]) 08/17/2016   ST elevation myocardial infarction involving left main coronary artery ([name])    STEMI (ST elevation myocardial infarction) ([name]) 08/15/2016   Type 2 diabetes mellitus with complication, with long-term current use of insulin  ([name])     OBJECTIVE General Patient is awake, alert, and oriented x 3 and in no acute distress. Derm Skin is dry and supple bilateral. Negative open lesions or macerations. Remaining integument unremarkable. Nails are tender, long, thickened and dystrophic with subungual debris, consistent with onychomycosis, 1-5 bilateral. No signs of infection noted. Vasc  DP and PT pedal pulses palpable bilaterally. Temperature gradient within normal limits.  Neuro Epicritic and protective threshold sensation diminished bilaterally.  Musculoskeletal Exam No symptomatic pedal deformities noted bilateral. Muscular strength within normal limits.  ASSESSMENT 1. Diabetes Mellitus w/ peripheral neuropathy 2.  Pain due to onychomycosis of toenails bilateral  PLAN OF CARE 1. Patient evaluated today.  Comprehensive diabetic foot evaluation performed today 2. Instructed to maintain good pedal hygiene and foot  care. Stressed importance of controlling blood sugar.  3. Mechanical debridement of nails 1-5 bilaterally performed using a nail nipper. Filed with dremel without incident.  4. Return to clinic in 3 mos.     Thresa EMERSON Sar, DPM [facility]  Dr. Thresa EMERSON Sar, DPM    [address] [place], [place] [address]                Office [phone]  Fax [phone]
